# Patient Record
Sex: Male | Born: 1960 | Race: White | Hispanic: No | Marital: Single | Smoking: Current every day smoker
Health system: Southern US, Community
[De-identification: ages and names within clinical notes are randomized; demographics above are authoritative.]

## PROBLEM LIST (undated history)

## (undated) DIAGNOSIS — Z806 Family history of leukemia: Secondary | ICD-10-CM

## (undated) DIAGNOSIS — F32A Depression, unspecified: Secondary | ICD-10-CM

## (undated) DIAGNOSIS — J449 Chronic obstructive pulmonary disease, unspecified: Secondary | ICD-10-CM

## (undated) DIAGNOSIS — F419 Anxiety disorder, unspecified: Secondary | ICD-10-CM

## (undated) DIAGNOSIS — E785 Hyperlipidemia, unspecified: Secondary | ICD-10-CM

## (undated) DIAGNOSIS — Z8051 Family history of malignant neoplasm of kidney: Secondary | ICD-10-CM

## (undated) DIAGNOSIS — B351 Tinea unguium: Secondary | ICD-10-CM

## (undated) DIAGNOSIS — N1330 Unspecified hydronephrosis: Secondary | ICD-10-CM

## (undated) DIAGNOSIS — D649 Anemia, unspecified: Secondary | ICD-10-CM

## (undated) DIAGNOSIS — K759 Inflammatory liver disease, unspecified: Secondary | ICD-10-CM

## (undated) DIAGNOSIS — B182 Chronic viral hepatitis C: Secondary | ICD-10-CM

## (undated) DIAGNOSIS — M25569 Pain in unspecified knee: Secondary | ICD-10-CM

## (undated) DIAGNOSIS — R350 Frequency of micturition: Secondary | ICD-10-CM

## (undated) DIAGNOSIS — I639 Cerebral infarction, unspecified: Secondary | ICD-10-CM

## (undated) DIAGNOSIS — J9600 Acute respiratory failure, unspecified whether with hypoxia or hypercapnia: Secondary | ICD-10-CM

## (undated) DIAGNOSIS — L853 Xerosis cutis: Secondary | ICD-10-CM

## (undated) DIAGNOSIS — K219 Gastro-esophageal reflux disease without esophagitis: Secondary | ICD-10-CM

## (undated) DIAGNOSIS — M792 Neuralgia and neuritis, unspecified: Secondary | ICD-10-CM

## (undated) DIAGNOSIS — Z1509 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: Secondary | ICD-10-CM

## (undated) DIAGNOSIS — C661 Malignant neoplasm of right ureter: Secondary | ICD-10-CM

## (undated) DIAGNOSIS — L8990 Pressure ulcer of unspecified site, unspecified stage: Secondary | ICD-10-CM

## (undated) DIAGNOSIS — F331 Major depressive disorder, recurrent, moderate: Secondary | ICD-10-CM

## (undated) DIAGNOSIS — Z8042 Family history of malignant neoplasm of prostate: Secondary | ICD-10-CM

## (undated) DIAGNOSIS — R2689 Other abnormalities of gait and mobility: Secondary | ICD-10-CM

## (undated) DIAGNOSIS — C189 Malignant neoplasm of colon, unspecified: Secondary | ICD-10-CM

## (undated) DIAGNOSIS — I739 Peripheral vascular disease, unspecified: Secondary | ICD-10-CM

## (undated) DIAGNOSIS — Z8673 Personal history of transient ischemic attack (TIA), and cerebral infarction without residual deficits: Secondary | ICD-10-CM

## (undated) DIAGNOSIS — I87303 Chronic venous hypertension (idiopathic) without complications of bilateral lower extremity: Secondary | ICD-10-CM

## (undated) DIAGNOSIS — E46 Unspecified protein-calorie malnutrition: Secondary | ICD-10-CM

## (undated) DIAGNOSIS — F411 Generalized anxiety disorder: Secondary | ICD-10-CM

## (undated) DIAGNOSIS — C61 Malignant neoplasm of prostate: Secondary | ICD-10-CM

## (undated) DIAGNOSIS — I70209 Unspecified atherosclerosis of native arteries of extremities, unspecified extremity: Secondary | ICD-10-CM

## (undated) DIAGNOSIS — N401 Enlarged prostate with lower urinary tract symptoms: Secondary | ICD-10-CM

## (undated) DIAGNOSIS — G8929 Other chronic pain: Secondary | ICD-10-CM

## (undated) DIAGNOSIS — N131 Hydronephrosis with ureteral stricture, not elsewhere classified: Secondary | ICD-10-CM

## (undated) DIAGNOSIS — N138 Other obstructive and reflux uropathy: Secondary | ICD-10-CM

## (undated) DIAGNOSIS — M6281 Muscle weakness (generalized): Secondary | ICD-10-CM

## (undated) DIAGNOSIS — Z800 Family history of malignant neoplasm of digestive organs: Secondary | ICD-10-CM

## (undated) DIAGNOSIS — C801 Malignant (primary) neoplasm, unspecified: Secondary | ICD-10-CM

## (undated) DIAGNOSIS — Z720 Tobacco use: Secondary | ICD-10-CM

## (undated) DIAGNOSIS — G4700 Insomnia, unspecified: Secondary | ICD-10-CM

## (undated) DIAGNOSIS — R1310 Dysphagia, unspecified: Secondary | ICD-10-CM

## (undated) DIAGNOSIS — F329 Major depressive disorder, single episode, unspecified: Secondary | ICD-10-CM

## (undated) DIAGNOSIS — C679 Malignant neoplasm of bladder, unspecified: Secondary | ICD-10-CM

## (undated) HISTORY — DX: Anxiety disorder, unspecified: F41.9

## (undated) HISTORY — DX: Depression, unspecified: F32.A

## (undated) HISTORY — DX: Insomnia, unspecified: G47.00

## (undated) HISTORY — DX: Chronic viral hepatitis C: B18.2

## (undated) HISTORY — DX: Personal history of transient ischemic attack (TIA), and cerebral infarction without residual deficits: Z86.73

## (undated) HISTORY — DX: Unspecified protein-calorie malnutrition: E46

## (undated) HISTORY — DX: Tobacco use: Z72.0

## (undated) HISTORY — DX: Major depressive disorder, single episode, unspecified: F32.9

## (undated) HISTORY — DX: Malignant (primary) neoplasm, unspecified: C80.1

## (undated) HISTORY — DX: Major depressive disorder, recurrent, moderate: F33.1

## (undated) HISTORY — DX: Cerebral infarction, unspecified: I63.9

## (undated) HISTORY — PX: COLON SURGERY: SHX602

## (undated) HISTORY — DX: Family history of malignant neoplasm of prostate: Z80.42

## (undated) HISTORY — DX: Family history of malignant neoplasm of kidney: Z80.51

## (undated) HISTORY — PX: OTHER SURGICAL HISTORY: SHX169

## (undated) HISTORY — DX: Muscle weakness (generalized): M62.81

## (undated) HISTORY — DX: Xerosis cutis: L85.3

## (undated) HISTORY — DX: Tinea unguium: B35.1

## (undated) HISTORY — DX: 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: Z15.09

## (undated) HISTORY — DX: Other chronic pain: G89.29

## (undated) HISTORY — DX: Family history of leukemia: Z80.6

## (undated) HISTORY — DX: Other abnormalities of gait and mobility: R26.89

## (undated) HISTORY — DX: Benign prostatic hyperplasia with lower urinary tract symptoms: N13.8

## (undated) HISTORY — DX: Neuralgia and neuritis, unspecified: M79.2

## (undated) HISTORY — DX: Family history of malignant neoplasm of digestive organs: Z80.0

## (undated) HISTORY — DX: Other obstructive and reflux uropathy: N40.1

## (undated) HISTORY — DX: Generalized anxiety disorder: F41.1

## (undated) HISTORY — DX: Hyperlipidemia, unspecified: E78.5

## (undated) MED FILL — Iron Sucrose Inj 20 MG/ML (Fe Equiv): INTRAVENOUS | Qty: 10 | Status: AC

---

## 2005-10-31 ENCOUNTER — Emergency Department: Payer: Self-pay | Admitting: Emergency Medicine

## 2017-11-11 ENCOUNTER — Ambulatory Visit (INDEPENDENT_AMBULATORY_CARE_PROVIDER_SITE_OTHER): Payer: Medicaid Other | Admitting: Internal Medicine

## 2017-11-11 ENCOUNTER — Encounter: Payer: Self-pay | Admitting: Internal Medicine

## 2017-11-11 VITALS — BP 133/75 | HR 74 | Resp 16 | Ht 67.0 in | Wt 140.0 lb

## 2017-11-11 DIAGNOSIS — R300 Dysuria: Secondary | ICD-10-CM

## 2017-11-11 DIAGNOSIS — Z125 Encounter for screening for malignant neoplasm of prostate: Secondary | ICD-10-CM

## 2017-11-11 DIAGNOSIS — I639 Cerebral infarction, unspecified: Secondary | ICD-10-CM | POA: Diagnosis not present

## 2017-11-11 DIAGNOSIS — R531 Weakness: Secondary | ICD-10-CM | POA: Diagnosis not present

## 2017-11-11 DIAGNOSIS — C189 Malignant neoplasm of colon, unspecified: Secondary | ICD-10-CM

## 2017-11-11 DIAGNOSIS — E782 Mixed hyperlipidemia: Secondary | ICD-10-CM | POA: Diagnosis not present

## 2017-11-11 DIAGNOSIS — F322 Major depressive disorder, single episode, severe without psychotic features: Secondary | ICD-10-CM

## 2017-11-11 MED ORDER — ATORVASTATIN CALCIUM 80 MG PO TABS: 80.0000 mg | ORAL_TABLET | Freq: Every day | ORAL | 3 refills | Status: DC

## 2017-11-11 MED ORDER — GABAPENTIN 100 MG PO CAPS: ORAL_CAPSULE | ORAL | 3 refills | Status: DC

## 2017-11-11 MED ORDER — QUETIAPINE FUMARATE 50 MG PO TABS: 50.0000 mg | ORAL_TABLET | Freq: Every day | ORAL | 3 refills | Status: DC

## 2017-11-11 MED ORDER — ESCITALOPRAM OXALATE 20 MG PO TABS: 20.0000 mg | ORAL_TABLET | Freq: Every day | ORAL | 3 refills | Status: DC

## 2017-11-11 NOTE — Progress Notes (Signed)
[facility] [place], [place] [address]  Internal MEDICINE  Office Visit Note  Patient Name: Chad [name]  [id number]  [id number]  Date of Service: 11/11/2017  Complaints/HPI Pt is here for establishment of PCP.  Pt is just moving from out of town. He has multiple medical problems. 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 Pt does have 40 year pack history.Pt is c/o being depressed. He has decreased po intake. Living with his ex- wife  Current Medication: Outpatient Encounter Medications as of 11/11/2017  Medication Sig  . atorvastatin (LIPITOR) 80 MG tablet Take 1 tablet (80 mg total) by mouth daily.  . furosemide (LASIX) 20 MG tablet Take 20 mg by mouth.  Marland Kitchen ibuprofen (ADVIL,MOTRIN) 200 MG tablet Take 200 mg by mouth every 6 (six) hours as needed.  . nortriptyline (PAMELOR) 25 MG capsule Take 25 mg by mouth at bedtime.  . [DISCONTINUED] atorvastatin (LIPITOR) 80 MG tablet Take 80 mg by mouth daily.  . [DISCONTINUED] escitalopram (LEXAPRO) 10 MG tablet Take 10 mg by mouth daily.  . [DISCONTINUED] QUEtiapine (SEROQUEL) 50 MG tablet Take 50 mg by mouth at bedtime.  Marland Kitchen escitalopram (LEXAPRO) 20 MG tablet Take 1 tablet (20 mg total) by mouth daily.  Marland Kitchen gabapentin (NEURONTIN) 100 MG capsule Take one to 2 tabs for pain 3 x day  . QUEtiapine (SEROQUEL) 50 MG tablet Take 1 tablet (50 mg total) by mouth at bedtime.   No facility-administered encounter medications on file as of 11/11/2017.    Surgical History: Past Surgical History:  Procedure Laterality Date  . COLON SURGERY    . tumor removed      Medical History: Past Medical History:  Diagnosis Date  . Anxiety   . Cancer [facility])    prostate  . Depression   . Hyperlipidemia   . Nerve pain   . Stroke [facility])     Family History: Family History  Problem Relation  Age of Onset  . Prostate cancer Father     Social History   Socioeconomic History  . Marital status: Single    Spouse name: Not on file  . Number of children: Not on file  . Years of education: Not on file  . Highest education level: Not on file  Occupational History  . Not on file  Social Needs  . Financial resource strain: Not on file  . Food insecurity:    Worry: Not on file    Inability: Not on file  . Transportation needs:    Medical: Not on file    Non-medical: Not on file  Tobacco Use  . Smoking status: Current Every Day Smoker    Packs/day: 1.00    Types: Cigarettes  . Smokeless tobacco: Never Used  Substance and Sexual Activity  . Alcohol use: Never    Frequency: Never  . Drug use: Never  . Sexual activity: Not on file  Lifestyle  . Physical activity:    Days per week: Not on file    Minutes per session: Not on file  . Stress: Not on file  Relationships  . Social connections:    Talks on phone: Not on file    Gets together: Not on file    Attends religious service: Not on file    Active member of club or organization: Not on file    Attends meetings of clubs  or organizations: Not on file    Relationship status: Not on file  . Intimate partner violence:    Fear of current or ex partner: Not on file    Emotionally abused: Not on file    Physically abused: Not on file    Forced sexual activity: Not on file  Other Topics Concern  . Not on file  Social History Narrative  . Not on file   Review of Systems  Constitutional: Negative for chills, fatigue and unexpected weight change.  HENT: Positive for postnasal drip. Negative for congestion, rhinorrhea, sneezing and sore throat.   Eyes: Negative for redness.  Respiratory: Positive for cough and shortness of breath. Negative for chest tightness.   Cardiovascular: Negative for chest pain and palpitations.  Gastrointestinal: Negative for abdominal pain, constipation, diarrhea, nausea and vomiting.   Genitourinary: Negative for dysuria and frequency.  Musculoskeletal: Negative for arthralgias, back pain, joint swelling and neck pain.  Skin: Negative for rash.  Neurological: Negative.  Negative for tremors and numbness.  Hematological: Negative for adenopathy. Does not bruise/bleed easily.  Psychiatric/Behavioral: Negative for behavioral problems (Depression), sleep disturbance and suicidal ideas. The patient is not nervous/anxious.    Vital Signs: BP 133/75   Pulse 74   Resp 16   Ht 5\' 7"  (1.702 m)   Wt 140 lb (63.5 kg)   SpO2 98%   BMI 21.93 kg/m   Physical Exam  Constitutional: He is oriented to person, place, and time. He appears well-developed and well-nourished. No distress.  HENT:  Head: Normocephalic and atraumatic.  Mouth/Throat: Oropharynx is clear and moist. No oropharyngeal exudate.  Eyes: Pupils are equal, round, and reactive to light. EOM are normal.  Neck: Normal range of motion. Neck supple. No JVD present. No tracheal deviation present. No thyromegaly present.  Cardiovascular: Normal rate, regular rhythm and normal heart sounds. Exam reveals no gallop and no friction rub.  No murmur heard. Pulmonary/Chest: Effort normal. No respiratory distress. He has no wheezes. He has no rales. He exhibits no tenderness.  Abdominal: Soft. Bowel sounds are normal.  Musculoskeletal: Normal range of motion.  Lymphadenopathy:    He has no cervical adenopathy.  Neurological: He is alert and oriented to person, place, and time. No cranial nerve deficit. He exhibits abnormal muscle tone.  Left sided weakness   Skin: Skin is warm and dry. He is not diaphoretic.  Psychiatric: He has a normal mood and affect. His behavior is normal. Judgment and thought content normal.   Assessment/Plan: 1. Cerebrovascular accident (CVA), unspecified mechanism ([place]) - Comprehensive metabolic panel - US Carotid Bilateral; Future  2. Left-sided weakness - Increase gabapentin (NEURONTIN) 100 MG  capsule; Take one to 2 tabs for pain 3 x day  Dispense: 180 capsule; Refill: 3 - CBC with Differential/Platelet - TSH - T4, free - US Carotid Bilateral; Future  3. Mixed hyperlipidemia - atorvastatin (LIPITOR) 80 MG tablet; Take 1 tablet (80 mg total) by mouth daily.  Dispense: 90 tablet; Refill: 3 - Lipid Panel With LDL/HDL Ratio  4. Prostate cancer screening - PSA  5. Dysuria - Urinalysis  6. Malignant neoplasm of colon, unspecified part of colon ([place]) - Will need to see GI and Oncology  7. Depression, major, single episode, severe (HCC) - Continue QUEtiapine (SEROQUEL) 50 MG tablet; Take 1 tablet (50 mg total) by mouth at bedtime.  Dispense: 90 tablet; Refill: 3 - Increase escitalopram (LEXAPRO) 20 MG tablet; Take 1 tablet (20 mg total) by mouth daily.  Dispense: 90 tablet;  Refill: 3  6. Malignant neoplasm of colon, unspecified part of colon ([place]) - Will need to see GI and oncology   General Counseling: kehinde bowdish understanding of the findings of todays visit and agrees with plan of treatment. I have discussed any further diagnostic evaluation that may be needed or ordered today. We also reviewed his medications today. he has been encouraged to call the office with any questions or concerns that should arise related to todays visit. All medical records reviewed   Orders Placed This Encounter  Procedures  . US Carotid Bilateral  . CBC with Differential/Platelet  . Lipid Panel With LDL/HDL Ratio  . TSH  . T4, free  . Comprehensive metabolic panel  . Urinalysis  . PSA    Meds ordered this encounter  Medications  . atorvastatin (LIPITOR) 80 MG tablet    Sig: Take 1 tablet (80 mg total) by mouth daily.    Dispense:  90 tablet    Refill:  3  . gabapentin (NEURONTIN) 100 MG capsule    Sig: Take one to 2 tabs for pain 3 x day    Dispense:  180 capsule    Refill:  3  . QUEtiapine (SEROQUEL) 50 MG tablet    Sig: Take 1 tablet (50 mg total) by mouth at bedtime.     Dispense:  90 tablet    Refill:  3  . escitalopram (LEXAPRO) 20 MG tablet    Sig: Take 1 tablet (20 mg total) by mouth daily.    Dispense:  90 tablet    Refill:  3    Time spent: 35 Minutes

## 2017-11-19 ENCOUNTER — Encounter: Payer: Self-pay | Admitting: Internal Medicine

## 2017-11-23 ENCOUNTER — Ambulatory Visit: Payer: Self-pay | Admitting: Pharmacy Technician

## 2017-11-23 DIAGNOSIS — Z79899 Other long term (current) drug therapy: Secondary | ICD-10-CM

## 2017-11-23 NOTE — Progress Notes (Addendum)
Completed [facility] application and contract.  Patient agreed to all terms of the [facility] contract.    Referred patient to Dr. Trellis Paganini and Dr. Ellsworth [facility].  Patient expressed concerns regarding obtaining food.  Sending referral for emergency food through [facility].  Patient approved to receive medication assistance at [facility] through 2019, as long as eligibility criteria continues to be met.[facility]  Obtained consent for [place] [facility].  Resent referral to [facility] on [address].  [facility]

## 2017-11-24 ENCOUNTER — Telehealth: Payer: Self-pay

## 2017-11-24 NOTE — Telephone Encounter (Signed)
Called patient and gave financial dept #, we do not do referral for charity care and he needs to reach out to dept and they can set him up with paperwork. [place]

## 2017-11-25 ENCOUNTER — Ambulatory Visit: Payer: Self-pay

## 2017-12-25 ENCOUNTER — Other Ambulatory Visit: Payer: Self-pay

## 2018-01-05 ENCOUNTER — Ambulatory Visit: Payer: Self-pay | Admitting: Internal Medicine

## 2018-03-19 ENCOUNTER — Telehealth: Payer: Self-pay | Admitting: Pharmacy Technician

## 2018-03-19 NOTE — Telephone Encounter (Signed)
Patient has full Medicaid.  Patient acknowledged that he understood that [facility] would no longer be able to provide medication assistance.  [facility]

## 2018-04-21 ENCOUNTER — Emergency Department
Admission: EM | Admit: 2018-04-21 | Discharge: 2018-04-21 | Disposition: A | Discharge status: Home / Self Care | Payer: Medicaid Other | Attending: Emergency Medicine | Admitting: Emergency Medicine

## 2018-04-21 ENCOUNTER — Emergency Department: Payer: Medicaid Other

## 2018-04-21 ENCOUNTER — Encounter: Payer: Self-pay | Admitting: Emergency Medicine

## 2018-04-21 ENCOUNTER — Other Ambulatory Visit: Payer: Self-pay

## 2018-04-21 DIAGNOSIS — F17210 Nicotine dependence, cigarettes, uncomplicated: Secondary | ICD-10-CM | POA: Insufficient documentation

## 2018-04-21 DIAGNOSIS — Z79899 Other long term (current) drug therapy: Secondary | ICD-10-CM | POA: Insufficient documentation

## 2018-04-21 DIAGNOSIS — M25562 Pain in left knee: Secondary | ICD-10-CM | POA: Diagnosis present

## 2018-04-21 DIAGNOSIS — M1712 Unilateral primary osteoarthritis, left knee: Secondary | ICD-10-CM | POA: Diagnosis not present

## 2018-04-21 MED ORDER — TRAMADOL HCL 50 MG PO TABS: 50.0000 mg | ORAL_TABLET | Freq: Four times a day (QID) | ORAL | 0 refills | Status: DC | PRN

## 2018-04-21 NOTE — ED Provider Notes (Signed)
[facility] Emergency Department Provider Note   ____________________________________________   First MD Initiated Contact with Patient 04/21/18 1446     (approximate)  I have reviewed the triage vital signs and the nursing notes.   HISTORY  Chief Complaint Knee Pain    HPI Chad [name] is a 57 y.o. male patient presents with 6-year history of left knee pain.  Patient state he noticed injury 6 years ago but cannot remember the exact provocative incidents.  Patient state he is taking gabapentin for nerve pain but is not helping his knee.  Patient also takes Lexapro for anxiety.  Patient family report he is attention seeking.  Patient requests "legal drugs" for knee pain.  Past Medical History:  Diagnosis Date  . Anxiety   . Cancer [facility])    prostate  . Depression   . Hyperlipidemia   . Nerve pain   . Stroke [facility])     There are no active problems to display for this patient.   Past Surgical History:  Procedure Laterality Date  . COLON SURGERY     En bloc extended right hemicolectomy 07/2017  . tumor removed       Prior to Admission medications   Medication Sig Start Date End Date Taking? Authorizing Provider  atorvastatin (LIPITOR) 80 MG tablet Take 1 tablet (80 mg total) by mouth daily. 11/11/17   [name], MD  escitalopram (LEXAPRO) 20 MG tablet Take 1 tablet (20 mg total) by mouth daily. 11/11/17   [name], MD  furosemide (LASIX) 20 MG tablet Take 20 mg by mouth.    [provider]  gabapentin (NEURONTIN) 100 MG capsule Take one to 2 tabs for pain 3 x day 11/11/17   [name], MD  ibuprofen (ADVIL,MOTRIN) 200 MG tablet Take 200 mg by mouth every 6 (six) hours as needed.    [provider]  QUEtiapine (SEROQUEL) 50 MG tablet Take 1 tablet (50 mg total) by mouth at bedtime. 11/11/17   [name], MD  traMADol (ULTRAM) 50 MG tablet Take 1 tablet (50 mg total) by mouth every 6 (six) hours as needed. 04/21/18  04/21/19  Sable Feil, PA-C    Allergies Penicillins  Family History  Problem Relation Age of Onset  . Prostate cancer Father     Social History Social History   Tobacco Use  . Smoking status: Current Every Day Smoker    Packs/day: 1.00    Types: Cigarettes  . Smokeless tobacco: Never Used  Substance Use Topics  . Alcohol use: Never    Frequency: Never  . Drug use: Never    Review of Systems  Constitutional: No fever/chills Eyes: No visual changes. ENT: No sore throat. Cardiovascular: Denies chest pain. Respiratory: Denies shortness of breath. Gastrointestinal: No abdominal pain.  No nausea, no vomiting.  No diarrhea.  No constipation. Genitourinary: Negative for dysuria. Musculoskeletal: Negative for back pain. Skin: Negative for rash. Neurological: Negative for headaches, focal weakness or numbness. Psychiatric:Anxiety and depression. Endocrine:Hyperlipidemia. Allergic/Immunilogical: Penicillin. ____________________________________________   PHYSICAL EXAM:  VITAL SIGNS: ED Triage Vitals  Enc Vitals Group     BP 04/21/18 1408 115/74     Pulse Rate 04/21/18 1408 88     Resp 04/21/18 1408 16     Temp 04/21/18 1408 98.2 F (36.8 C)     Temp Source 04/21/18 1408 Oral     SpO2 04/21/18 1408 100 %     Weight 04/21/18 1409 147 lb (66.7  kg)     Height 04/21/18 1409 5\' 7"  (1.702 m)     Head Circumference --      Peak Flow --      Pain Score 04/21/18 1408 8     Pain Loc --      Pain Edu? --      Excl. in [place]? --    Constitutional: Alert and oriented. Well appearing and in no acute distress. Cardiovascular: Normal rate, regular rhythm. Grossly normal heart sounds.  Good peripheral circulation. Respiratory: Normal respiratory effort.  No retractions. Lungs CTAB. Musculoskeletal: Patient is wearing a Velcro knee brace.  Removal of the brain shows no obvious deformity or edema.  Mild crepitus palpation.  No joint effusion patient has full equal range of  motion.  Neurologic:  Normal speech and language. No gross focal neurologic deficits are appreciated. No gait instability. Skin:  Skin is warm, dry and intact. No rash noted. Psychiatric: Mood and affect are normal. Speech and behavior are normal.  ____________________________________________   LABS (all labs ordered are listed, but only abnormal results are displayed)  Labs Reviewed - No data to display ____________________________________________  EKG   ____________________________________________  RADIOLOGY  ED MD interpretation:    Official radiology report(s): Dg Knee Complete 4 Views Left  Result Date: 04/21/2018 CLINICAL DATA:  Left knee pain for 6 years. EXAM: LEFT KNEE - COMPLETE 4+ VIEW COMPARISON:  None. FINDINGS: Marked femorotibial joint space narrowing is identified greatest along the lateral femorotibial compartment where there is bone-on-bone apposition with subchondral sclerosis and cystic change noted across the joint. Minimal spurring is noted off the femoral condyles and lateral tibial plateau. Small joint effusion is identified on the lateral view within the suprapatellar compartment. No fracture or bone destruction is identified. IMPRESSION: Marked femorotibial joint space narrowing of the left knee more so along the lateral aspect where there is bone-on-bone apposition. Small suprapatellar joint effusion. No acute osseous abnormality. Electronically Signed   By: Ashley Royalty M.D.   On: 04/21/2018 15:38    ____________________________________________   PROCEDURES  Procedure(s) performed: None  Procedures  Critical Care performed: No  ____________________________________________   INITIAL IMPRESSION / ASSESSMENT AND PLAN / ED COURSE  As part of my medical decision making, I reviewed the following data within the electronic MEDICAL RECORD NUMBER   Knee pain secondary to severe DJD.  Discussed x-ray findings with patient.  Advised patient pain medication will  not cure this condition.  Asked to follow orthopedic by calling for an appointment in the morning.     ____________________________________________   FINAL CLINICAL IMPRESSION(S) / ED DIAGNOSES  Final diagnoses:  Primary osteoarthritis of left knee     ED Discharge Orders         Ordered    traMADol (ULTRAM) 50 MG tablet  Every 6 hours PRN     04/21/18 1559           Note:  This document was prepared using Dragon voice recognition software and may include unintentional dictation errors.    Sable Feil, PA-C 04/21/18 1603    Drenda Freeze, MD 04/26/18 [phone]

## 2018-04-21 NOTE — Discharge Instructions (Addendum)
Be advised that pain medication will not cure your condition.  You need to see an orthopedic doctor for definitive evaluation and treatment.

## 2018-04-21 NOTE — ED Notes (Signed)
See triage note  Presents with pain to left knee  States he has had pain to knee for a while  denies any recent injury  No swelling or dislocation noted

## 2018-04-21 NOTE — ED Triage Notes (Signed)
Pt in via EMS from home. EMS reports pt is out of his gabapentin and lexapro and c/o pain to left knee. Pt with hx of CVA tat affected left side.  VS 120/78, HR 68,  Family reports pt is attention seeking.

## 2018-04-21 NOTE — ED Triage Notes (Signed)
Pt comes into the ED via [facility] from home c/o left knee pain.  Patient has been taking gabapentin but he states "I need more drugs, the legal kind.  Donnald Garre been taking this one and it doesn't work".  Patient ambulatory to triage at this time and in NAD with even and unlabored respirations. Patient denies any known injury other than one 6 years ago.

## 2018-04-25 ENCOUNTER — Emergency Department: Payer: Medicaid Other

## 2018-04-25 ENCOUNTER — Inpatient Hospital Stay
Admission: EM | Admit: 2018-04-25 | Discharge: 2018-05-07 | DRG: 853 | Disposition: A | Discharge status: Skilled Nursing Facility | Payer: Medicaid Other | Attending: Internal Medicine | Admitting: Internal Medicine

## 2018-04-25 ENCOUNTER — Other Ambulatory Visit: Payer: Self-pay

## 2018-04-25 ENCOUNTER — Encounter
Admission: EM | Disposition: A | Discharge status: Skilled Nursing Facility | Payer: Self-pay | Source: Home / Self Care | Attending: Internal Medicine

## 2018-04-25 ENCOUNTER — Inpatient Hospital Stay: Payer: Medicaid Other | Admitting: Certified Registered"

## 2018-04-25 DIAGNOSIS — E785 Hyperlipidemia, unspecified: Secondary | ICD-10-CM | POA: Diagnosis present

## 2018-04-25 DIAGNOSIS — R6521 Severe sepsis with septic shock: Secondary | ICD-10-CM

## 2018-04-25 DIAGNOSIS — Z79899 Other long term (current) drug therapy: Secondary | ICD-10-CM

## 2018-04-25 DIAGNOSIS — Z8546 Personal history of malignant neoplasm of prostate: Secondary | ICD-10-CM

## 2018-04-25 DIAGNOSIS — J9601 Acute respiratory failure with hypoxia: Secondary | ICD-10-CM | POA: Diagnosis present

## 2018-04-25 DIAGNOSIS — N170 Acute kidney failure with tubular necrosis: Secondary | ICD-10-CM | POA: Diagnosis present

## 2018-04-25 DIAGNOSIS — J449 Chronic obstructive pulmonary disease, unspecified: Secondary | ICD-10-CM | POA: Diagnosis present

## 2018-04-25 DIAGNOSIS — M6282 Rhabdomyolysis: Secondary | ICD-10-CM | POA: Diagnosis present

## 2018-04-25 DIAGNOSIS — R1310 Dysphagia, unspecified: Secondary | ICD-10-CM | POA: Diagnosis not present

## 2018-04-25 DIAGNOSIS — J9602 Acute respiratory failure with hypercapnia: Secondary | ICD-10-CM | POA: Diagnosis present

## 2018-04-25 DIAGNOSIS — F329 Major depressive disorder, single episode, unspecified: Secondary | ICD-10-CM | POA: Diagnosis present

## 2018-04-25 DIAGNOSIS — Z85038 Personal history of other malignant neoplasm of large intestine: Secondary | ICD-10-CM | POA: Diagnosis not present

## 2018-04-25 DIAGNOSIS — F419 Anxiety disorder, unspecified: Secondary | ICD-10-CM | POA: Diagnosis present

## 2018-04-25 DIAGNOSIS — R451 Restlessness and agitation: Secondary | ICD-10-CM | POA: Diagnosis not present

## 2018-04-25 DIAGNOSIS — Z781 Physical restraint status: Secondary | ICD-10-CM

## 2018-04-25 DIAGNOSIS — Z7189 Other specified counseling: Secondary | ICD-10-CM | POA: Diagnosis not present

## 2018-04-25 DIAGNOSIS — D509 Iron deficiency anemia, unspecified: Secondary | ICD-10-CM | POA: Diagnosis present

## 2018-04-25 DIAGNOSIS — Z515 Encounter for palliative care: Secondary | ICD-10-CM | POA: Diagnosis not present

## 2018-04-25 DIAGNOSIS — E872 Acidosis: Secondary | ICD-10-CM | POA: Diagnosis present

## 2018-04-25 DIAGNOSIS — Z6820 Body mass index (BMI) 20.0-20.9, adult: Secondary | ICD-10-CM | POA: Diagnosis not present

## 2018-04-25 DIAGNOSIS — N1330 Unspecified hydronephrosis: Secondary | ICD-10-CM | POA: Diagnosis not present

## 2018-04-25 DIAGNOSIS — R6520 Severe sepsis without septic shock: Secondary | ICD-10-CM

## 2018-04-25 DIAGNOSIS — A419 Sepsis, unspecified organism: Principal | ICD-10-CM | POA: Diagnosis present

## 2018-04-25 DIAGNOSIS — G9389 Other specified disorders of brain: Secondary | ICD-10-CM | POA: Diagnosis present

## 2018-04-25 DIAGNOSIS — E43 Unspecified severe protein-calorie malnutrition: Secondary | ICD-10-CM | POA: Diagnosis present

## 2018-04-25 DIAGNOSIS — D649 Anemia, unspecified: Secondary | ICD-10-CM

## 2018-04-25 DIAGNOSIS — N179 Acute kidney failure, unspecified: Secondary | ICD-10-CM

## 2018-04-25 DIAGNOSIS — Z8673 Personal history of transient ischemic attack (TIA), and cerebral infarction without residual deficits: Secondary | ICD-10-CM | POA: Diagnosis not present

## 2018-04-25 DIAGNOSIS — Z1389 Encounter for screening for other disorder: Secondary | ICD-10-CM

## 2018-04-25 DIAGNOSIS — Z9049 Acquired absence of other specified parts of digestive tract: Secondary | ICD-10-CM

## 2018-04-25 DIAGNOSIS — B182 Chronic viral hepatitis C: Secondary | ICD-10-CM | POA: Diagnosis present

## 2018-04-25 DIAGNOSIS — C689 Malignant neoplasm of urinary organ, unspecified: Secondary | ICD-10-CM | POA: Diagnosis not present

## 2018-04-25 DIAGNOSIS — Z888 Allergy status to other drugs, medicaments and biological substances status: Secondary | ICD-10-CM

## 2018-04-25 DIAGNOSIS — G9341 Metabolic encephalopathy: Secondary | ICD-10-CM | POA: Diagnosis present

## 2018-04-25 DIAGNOSIS — Z4659 Encounter for fitting and adjustment of other gastrointestinal appliance and device: Secondary | ICD-10-CM

## 2018-04-25 DIAGNOSIS — J9690 Respiratory failure, unspecified, unspecified whether with hypoxia or hypercapnia: Secondary | ICD-10-CM

## 2018-04-25 DIAGNOSIS — C649 Malignant neoplasm of unspecified kidney, except renal pelvis: Secondary | ICD-10-CM | POA: Diagnosis present

## 2018-04-25 DIAGNOSIS — N136 Pyonephrosis: Secondary | ICD-10-CM | POA: Diagnosis present

## 2018-04-25 DIAGNOSIS — F17210 Nicotine dependence, cigarettes, uncomplicated: Secondary | ICD-10-CM | POA: Diagnosis present

## 2018-04-25 DIAGNOSIS — K219 Gastro-esophageal reflux disease without esophagitis: Secondary | ICD-10-CM | POA: Diagnosis present

## 2018-04-25 DIAGNOSIS — R4182 Altered mental status, unspecified: Secondary | ICD-10-CM | POA: Diagnosis not present

## 2018-04-25 HISTORY — PX: CYSTOSCOPY WITH STENT PLACEMENT: SHX5790

## 2018-04-25 LAB — URINE DRUG SCREEN, QUALITATIVE (ARMC ONLY)
AMPHETAMINES, UR SCREEN: NOT DETECTED
BENZODIAZEPINE, UR SCRN: NOT DETECTED
Barbiturates, Ur Screen: NOT DETECTED
Cannabinoid 50 Ng, Ur ~~LOC~~: NOT DETECTED
Cocaine Metabolite,Ur ~~LOC~~: NOT DETECTED
MDMA (Ecstasy)Ur Screen: NOT DETECTED
METHADONE SCREEN, URINE: NOT DETECTED
Opiate, Ur Screen: NOT DETECTED
PHENCYCLIDINE (PCP) UR S: NOT DETECTED
Tricyclic, Ur Screen: NOT DETECTED

## 2018-04-25 LAB — CBC
HCT: 21.7 % — ABNORMAL LOW (ref 40.0–52.0)
Hemoglobin: 6.4 g/dL — ABNORMAL LOW (ref 13.0–18.0)
MCH: 17.4 pg — ABNORMAL LOW (ref 26.0–34.0)
MCHC: 29.6 g/dL — ABNORMAL LOW (ref 32.0–36.0)
MCV: 58.8 fL — AB (ref 80.0–100.0)
PLATELETS: 352 10*3/uL (ref 150–440)
RBC: 3.7 MIL/uL — AB (ref 4.40–5.90)
RDW: 28.7 % — ABNORMAL HIGH (ref 11.5–14.5)
WBC: 18.2 10*3/uL — AB (ref 3.8–10.6)

## 2018-04-25 LAB — ABO/RH: ABO/RH(D): O NEG

## 2018-04-25 LAB — CBC WITH DIFFERENTIAL/PLATELET
Band Neutrophils: 3 %
Basophils Absolute: 0.2 10*3/uL — ABNORMAL HIGH (ref 0–0.1)
Basophils Relative: 1 %
Blasts: 0 %
Eosinophils Absolute: 0 10*3/uL (ref 0–0.7)
Eosinophils Relative: 0 %
HEMATOCRIT: 22.2 % — AB (ref 40.0–52.0)
Hemoglobin: 6.4 g/dL — ABNORMAL LOW (ref 13.0–18.0)
LYMPHS PCT: 9 %
Lymphs Abs: 1.5 10*3/uL (ref 1.0–3.6)
MCH: 15.9 pg — AB (ref 26.0–34.0)
MCHC: 28.8 g/dL — ABNORMAL LOW (ref 32.0–36.0)
MCV: 55 fL — ABNORMAL LOW (ref 80.0–100.0)
Metamyelocytes Relative: 1 %
Monocytes Absolute: 0.5 10*3/uL (ref 0.2–1.0)
Monocytes Relative: 3 %
Myelocytes: 0 %
NEUTROS PCT: 83 %
NRBC: 8 /100{WBCs} — AB
Neutro Abs: 14.9 10*3/uL — ABNORMAL HIGH (ref 1.4–6.5)
OTHER: 0 %
PROMYELOCYTES RELATIVE: 0 %
Platelets: 522 10*3/uL — ABNORMAL HIGH (ref 150–440)
RBC: 4.04 MIL/uL — ABNORMAL LOW (ref 4.40–5.90)
RDW: 22.9 % — ABNORMAL HIGH (ref 11.5–14.5)
WBC: 17.1 10*3/uL — ABNORMAL HIGH (ref 3.8–10.6)

## 2018-04-25 LAB — BLOOD GAS, ARTERIAL
Acid-base deficit: 10.6 mmol/L — ABNORMAL HIGH (ref 0.0–2.0)
Acid-base deficit: 11 mmol/L — ABNORMAL HIGH (ref 0.0–2.0)
Allens test (pass/fail): POSITIVE — AB
Bicarbonate: 15.8 mmol/L — ABNORMAL LOW (ref 20.0–28.0)
Bicarbonate: 14.7 mmol/L — ABNORMAL LOW (ref 20.0–28.0)
FIO2: 0.35
FIO2: 0.6
LHR: 16 {breaths}/min
LHR: 16 {breaths}/min
O2 SAT: 92.2 %
O2 SAT: 72.1 %
PEEP/CPAP: 5 cmH2O
PEEP: 5 cmH2O
Patient temperature: 37
Patient temperature: 37
VT: 500 mL
VT: 400 mL
pCO2 arterial: 36 mmHg (ref 32.0–48.0)
pCO2 arterial: 32 mmHg (ref 32.0–48.0)
pH, Arterial: 7.25 — ABNORMAL LOW (ref 7.350–7.450)
pH, Arterial: 7.27 — ABNORMAL LOW (ref 7.350–7.450)
pO2, Arterial: 75 mmHg — ABNORMAL LOW (ref 83.0–108.0)
pO2, Arterial: 44 mmHg — ABNORMAL LOW (ref 83.0–108.0)

## 2018-04-25 LAB — URINALYSIS, COMPLETE (UACMP) WITH MICROSCOPIC
BACTERIA UA: NONE SEEN
Bilirubin Urine: NEGATIVE
GLUCOSE, UA: NEGATIVE mg/dL
KETONES UR: NEGATIVE mg/dL
Leukocytes, UA: NEGATIVE
Nitrite: NEGATIVE
PH: 5 (ref 5.0–8.0)
Protein, ur: 100 mg/dL — AB
SPECIFIC GRAVITY, URINE: 1.017 (ref 1.005–1.030)
Specific Gravity, Urine: 1.017 (ref 1.005–1.030)
Squamous Epithelial / HPF: NONE SEEN (ref 0–5)
Squamous Epithelial / LPF: NONE SEEN (ref 0–5)

## 2018-04-25 LAB — LACTIC ACID, PLASMA: LACTIC ACID, VENOUS: 1.4 mmol/L (ref 0.5–1.9)

## 2018-04-25 LAB — COMPREHENSIVE METABOLIC PANEL
ALT: 283 U/L — ABNORMAL HIGH (ref 0–44)
AST: 1155 U/L — ABNORMAL HIGH (ref 15–41)
Albumin: 2.9 g/dL — ABNORMAL LOW (ref 3.5–5.0)
Alkaline Phosphatase: 96 U/L (ref 38–126)
Anion gap: 12 (ref 5–15)
BUN: 88 mg/dL — ABNORMAL HIGH (ref 6–20)
CHLORIDE: 102 mmol/L (ref 98–111)
CO2: 22 mmol/L (ref 22–32)
Calcium: 7.6 mg/dL — ABNORMAL LOW (ref 8.9–10.3)
Creatinine, Ser: 3.42 mg/dL — ABNORMAL HIGH (ref 0.61–1.24)
GFR calc non Af Amer: 18 mL/min — ABNORMAL LOW (ref >60)
GFR, EST AFRICAN AMERICAN: 21 mL/min — AB (ref >60)
Glucose, Bld: 209 mg/dL — ABNORMAL HIGH (ref 70–99)
POTASSIUM: 4.7 mmol/L (ref 3.5–5.1)
Sodium: 136 mmol/L (ref 135–145)
Total Bilirubin: 0.9 mg/dL (ref 0.3–1.2)
Total Protein: 7.5 g/dL (ref 6.5–8.1)

## 2018-04-25 LAB — ACETAMINOPHEN LEVEL: Acetaminophen (Tylenol), Serum: <10 ug/mL — ABNORMAL LOW (ref 10–30)

## 2018-04-25 LAB — BASIC METABOLIC PANEL
Anion gap: 9 (ref 5–15)
BUN: 96 mg/dL — ABNORMAL HIGH (ref 6–20)
CO2: 15 mmol/L — ABNORMAL LOW (ref 22–32)
Calcium: 6.2 mg/dL — CL (ref 8.9–10.3)
Chloride: 110 mmol/L (ref 98–111)
Creatinine, Ser: 3.23 mg/dL — ABNORMAL HIGH (ref 0.61–1.24)
GFR calc Af Amer: 23 mL/min — ABNORMAL LOW (ref >60)
GFR, EST NON AFRICAN AMERICAN: 19 mL/min — AB (ref >60)
GLUCOSE: 162 mg/dL — AB (ref 70–99)
POTASSIUM: 4.7 mmol/L (ref 3.5–5.1)
Sodium: 134 mmol/L — ABNORMAL LOW (ref 135–145)

## 2018-04-25 LAB — APTT: APTT: 42 s — AB (ref 24–36)

## 2018-04-25 LAB — BRAIN NATRIURETIC PEPTIDE: B NATRIURETIC PEPTIDE 5: 142 pg/mL — AB (ref 0.0–100.0)

## 2018-04-25 LAB — LIPASE, BLOOD: LIPASE: 71 U/L — AB (ref 11–51)

## 2018-04-25 LAB — MAGNESIUM: MAGNESIUM: 3 mg/dL — AB (ref 1.7–2.4)

## 2018-04-25 LAB — TRIGLYCERIDES: Triglycerides: 87 mg/dL (ref <150)

## 2018-04-25 LAB — MRSA PCR SCREENING: MRSA BY PCR: NEGATIVE

## 2018-04-25 LAB — GLUCOSE, CAPILLARY: Glucose-Capillary: 128 mg/dL — ABNORMAL HIGH (ref 70–99)

## 2018-04-25 LAB — PREPARE RBC (CROSSMATCH)

## 2018-04-25 LAB — SAMPLE TO BLOOD BANK

## 2018-04-25 LAB — PROTIME-INR
INR: 2.29
Prothrombin Time: 25 seconds — ABNORMAL HIGH (ref 11.4–15.2)

## 2018-04-25 LAB — TROPONIN I: TROPONIN I: 0.13 ng/mL — AB (ref <0.03)

## 2018-04-25 LAB — ETHANOL: Alcohol, Ethyl (B): <10 mg/dL (ref <10)

## 2018-04-25 LAB — CK
CK TOTAL: 18913 U/L — AB (ref 49–397)
Total CK: 13072 U/L — ABNORMAL HIGH (ref 49–397)

## 2018-04-25 LAB — SALICYLATE LEVEL: SALICYLATE LVL: 28.1 mg/dL (ref 2.8–30.0)

## 2018-04-25 LAB — PHOSPHORUS: Phosphorus: 7.1 mg/dL — ABNORMAL HIGH (ref 2.5–4.6)

## 2018-04-25 SURGERY — CYSTOSCOPY, WITH STENT INSERTION
Anesthesia: General | Laterality: Right

## 2018-04-25 MED ORDER — VANCOMYCIN HCL IN DEXTROSE 1-5 GM/200ML-% IV SOLN
1000.0000 mg | Freq: Once | INTRAVENOUS | Status: AC
  Administered 2018-04-25: 1000 mg via INTRAVENOUS
  Filled 2018-04-25: qty 200

## 2018-04-25 MED ORDER — PANTOPRAZOLE SODIUM 40 MG PO PACK: 40.0000 mg | PACK | Freq: Every day | ORAL | Status: DC

## 2018-04-25 MED ORDER — NOREPINEPHRINE 4 MG/250ML-% IV SOLN
0.0000 ug/min | INTRAVENOUS | Status: DC
  Administered 2018-04-25: 2 ug/min via INTRAVENOUS

## 2018-04-25 MED ORDER — FENTANYL CITRATE (PF) 100 MCG/2ML IJ SOLN
100.0000 ug | INTRAMUSCULAR | Status: DC | PRN
  Administered 2018-04-26: 100 ug via INTRAVENOUS
  Filled 2018-04-25: qty 2

## 2018-04-25 MED ORDER — NOREPINEPHRINE 4 MG/250ML-% IV SOLN
0.0000 ug/min | Freq: Once | INTRAVENOUS | Status: DC
  Filled 2018-04-25: qty 250

## 2018-04-25 MED ORDER — VANCOMYCIN VARIABLE DOSE PER UNSTABLE RENAL FUNCTION (PHARMACIST DOSING): Status: DC

## 2018-04-25 MED ORDER — SODIUM CHLORIDE 0.9 % IV BOLUS
1000.0000 mL | Freq: Once | INTRAVENOUS | Status: AC
  Administered 2018-04-25: 1000 mL via INTRAVENOUS

## 2018-04-25 MED ORDER — SODIUM CHLORIDE 0.9% IV SOLUTION
Freq: Once | INTRAVENOUS | Status: DC
  Filled 2018-04-25: qty 250

## 2018-04-25 MED ORDER — SODIUM CHLORIDE 0.9 % IV SOLN
1.0000 g | Freq: Once | INTRAVENOUS | Status: AC
  Administered 2018-04-25: 1 g via INTRAVENOUS
  Filled 2018-04-25: qty 1

## 2018-04-25 MED ORDER — LORAZEPAM 2 MG/ML IJ SOLN
0.5000 mg | Freq: Once | INTRAMUSCULAR | Status: AC
  Administered 2018-04-25: 0.5 mg via INTRAVENOUS
  Filled 2018-04-25: qty 1

## 2018-04-25 MED ORDER — SODIUM CHLORIDE 0.9% IV SOLUTION: Freq: Once | INTRAVENOUS | Status: DC

## 2018-04-25 MED ORDER — MIDAZOLAM HCL 5 MG/5ML IJ SOLN
2.0000 mg | Freq: Once | INTRAMUSCULAR | Status: AC
  Administered 2018-04-25: 2 mg via INTRAVENOUS

## 2018-04-25 MED ORDER — ASPIRIN 300 MG RE SUPP
300.0000 mg | Freq: Once | RECTAL | Status: AC
  Administered 2018-04-25: 300 mg via RECTAL
  Filled 2018-04-25: qty 1

## 2018-04-25 MED ORDER — SUCCINYLCHOLINE CHLORIDE 20 MG/ML IJ SOLN
100.0000 mg | Freq: Once | INTRAMUSCULAR | Status: AC
  Administered 2018-04-25: 100 mg via INTRAVENOUS

## 2018-04-25 MED ORDER — CHLORHEXIDINE GLUCONATE 0.12% ORAL RINSE (MEDLINE KIT)
15.0000 mL | Freq: Two times a day (BID) | OROMUCOSAL | Status: DC
  Administered 2018-04-25 – 2018-05-06 (×18): 15 mL via OROMUCOSAL

## 2018-04-25 MED ORDER — FENTANYL CITRATE (PF) 100 MCG/2ML IJ SOLN
INTRAMUSCULAR | Status: AC
  Filled 2018-04-25: qty 2

## 2018-04-25 MED ORDER — NALOXONE HCL 2 MG/2ML IJ SOSY
4.0000 mg | PREFILLED_SYRINGE | Freq: Once | INTRAMUSCULAR | Status: AC
  Administered 2018-04-25: 4 mg via INTRAVENOUS

## 2018-04-25 MED ORDER — SODIUM BICARBONATE 8.4 % IV SOLN
INTRAVENOUS | Status: DC
  Administered 2018-04-25 – 2018-04-27 (×3): via INTRAVENOUS
  Filled 2018-04-25 (×7): qty 150

## 2018-04-25 MED ORDER — FENTANYL CITRATE (PF) 100 MCG/2ML IJ SOLN: 100.0000 ug | INTRAMUSCULAR | Status: DC | PRN

## 2018-04-25 MED ORDER — NALOXONE HCL 2 MG/2ML IJ SOSY
PREFILLED_SYRINGE | INTRAMUSCULAR | Status: AC
  Administered 2018-04-25: 2 mg via INTRAVENOUS
  Filled 2018-04-25: qty 2

## 2018-04-25 MED ORDER — ORAL CARE MOUTH RINSE
15.0000 mL | OROMUCOSAL | Status: DC
  Administered 2018-04-26 – 2018-04-28 (×23): 15 mL via OROMUCOSAL

## 2018-04-25 MED ORDER — DEXTROSE IN LACTATED RINGERS 5 % IV SOLN
INTRAVENOUS | Status: DC
  Administered 2018-04-25: 50 mL/h via INTRAVENOUS

## 2018-04-25 MED ORDER — PROPOFOL 1000 MG/100ML IV EMUL
0.0000 ug/kg/min | INTRAVENOUS | Status: DC
  Administered 2018-04-25: 40 ug/kg/min via INTRAVENOUS
  Administered 2018-04-25: 30 ug/kg/min via INTRAVENOUS
  Administered 2018-04-26 (×2): 40 ug/kg/min via INTRAVENOUS
  Filled 2018-04-25 (×4): qty 100

## 2018-04-25 MED ORDER — PROPOFOL 500 MG/50ML IV EMUL
INTRAVENOUS | Status: AC
  Filled 2018-04-25: qty 50

## 2018-04-25 MED ORDER — VANCOMYCIN HCL IN DEXTROSE 1-5 GM/200ML-% IV SOLN
1000.0000 mg | INTRAVENOUS | Status: DC
  Administered 2018-04-26: 1000 mg via INTRAVENOUS
  Filled 2018-04-25: qty 200

## 2018-04-25 MED ORDER — LEVOFLOXACIN IN D5W 750 MG/150ML IV SOLN: 750.0000 mg | Freq: Once | INTRAVENOUS | Status: DC

## 2018-04-25 MED ORDER — DOCUSATE SODIUM 100 MG PO CAPS: 100.0000 mg | ORAL_CAPSULE | Freq: Two times a day (BID) | ORAL | Status: DC | PRN

## 2018-04-25 MED ORDER — LORAZEPAM 2 MG/ML IJ SOLN
0.5000 mg | Freq: Once | INTRAMUSCULAR | Status: AC
  Administered 2018-04-25: 0.5 mg via INTRAVENOUS

## 2018-04-25 MED ORDER — NALOXONE HCL 2 MG/2ML IJ SOSY
2.0000 mg | PREFILLED_SYRINGE | Freq: Once | INTRAMUSCULAR | Status: AC
  Administered 2018-04-25: 2 mg via INTRAVENOUS

## 2018-04-25 SURGICAL SUPPLY — 22 items
14fr foley tray system ×2 IMPLANT
BAG DRAIN CYSTO-URO LG1000N (MISCELLANEOUS) ×2 IMPLANT
BRUSH SCRUB EZ  4% CHG (MISCELLANEOUS)
BRUSH SCRUB EZ 4% CHG (MISCELLANEOUS) IMPLANT
CATH URETL 5X70 OPEN END (CATHETERS) ×2 IMPLANT
CONRAY 43 FOR UROLOGY 50M (MISCELLANEOUS) ×2 IMPLANT
GLOVE BIO SURGEON STRL SZ 6.5 (GLOVE) ×2 IMPLANT
GOWN STRL REUS W/ TWL LRG LVL3 (GOWN DISPOSABLE) ×2 IMPLANT
GOWN STRL REUS W/TWL LRG LVL3 (GOWN DISPOSABLE) ×2
KIT TURNOVER CYSTO (KITS) ×2 IMPLANT
PACK CYSTO AR (MISCELLANEOUS) ×2 IMPLANT
SENSORWIRE 0.038 NOT ANGLED (WIRE) ×2
SET CYSTO W/LG BORE CLAMP LF (SET/KITS/TRAYS/PACK) ×2 IMPLANT
SOL .9 NS 3000ML IRR  AL (IV SOLUTION) ×1
SOL .9 NS 3000ML IRR UROMATIC (IV SOLUTION) ×1 IMPLANT
STENT URET 6FRX24 CONTOUR (STENTS) IMPLANT
STENT URET 6FRX26 CONTOUR (STENTS) IMPLANT
SURGILUBE 2OZ TUBE FLIPTOP (MISCELLANEOUS) ×2 IMPLANT
SYRINGE IRR TOOMEY STRL 70CC (SYRINGE) ×2 IMPLANT
TRAY FOLEY SLVR 14FR TEMP STAT (SET/KITS/TRAYS/PACK) ×2 IMPLANT
WATER STERILE IRR 1000ML POUR (IV SOLUTION) ×2 IMPLANT
WIRE SENSOR 0.038 NOT ANGLED (WIRE) ×1 IMPLANT

## 2018-04-25 NOTE — ED Notes (Signed)
Date and time results received: 04/25/18 8:33 AM   Test: Troponin Critical Value: 0.13 ng/mL  Name of Provider Notified: Dr. Cinda Quest

## 2018-04-25 NOTE — Progress Notes (Signed)
CODE SEPSIS - PHARMACY COMMUNICATION  **Broad Spectrum Antibiotics should be administered within 1 hour of Sepsis diagnosis**  Time Code Sepsis Called/Page Received: 10:07  Antibiotics Ordered: cefepime  Time of 1st antibiotic administration: 10:08  Additional action taken by pharmacy:   If necessary, Name of Provider/Nurse Contacted:     Laural Benes ,PharmD Clinical Pharmacist  04/25/2018  10:11 AM

## 2018-04-25 NOTE — Progress Notes (Signed)
RN made Chad Mane, NP aware that patient's calcium is critical at 6.2, NP acknowledged and gave no new orders.

## 2018-04-25 NOTE — Op Note (Signed)
Date of procedure: 04/25/18  Preoperative diagnosis:  1. Severe right hydronephrosis 2. Acute kidney injury 3. Sepsis with multiorgan dysfunction  Postoperative diagnosis:  1. As above   Procedure: 1. Cystoscopy 2. Right retrograde pyelogram 3. Right ureteral stent placement 4. Replacement of Foley catheter  Surgeon: Hollice Espy, MD  Anesthesia: General  Complications: None  Intraoperative findings: Extremely difficult Foley catheter placement.  Filling defect appreciated in right proximal ureter with severe hydronephrosis, medial deviation of the ureter which was extremely narrow along the entire course of the ureter with distal J hooking, medially deviated  ?Retroperitoneal fibrosis.  Hydronephrotic drip appreciated with motor oil like appearance of upper tract urine.  EBL: Minimal  Specimens: Urine cytology from right renal pelvis  Drains: 6 x 24 French double-J ureteral stent on right, 14 French ICU temperature probe catheter  Indication: Chad [name] is a 57 y.o. patient with severe right hydronephrosis, see consult note for details.  Informed consent was obtained from patient's healthcare proxy, his daughter by telephone.    Description of procedure:  The patient was taken to the operating room transported from the ICU.  He is [name] intubated and remained under sedation and was monitored.  He is placed in the dorsolithotomy position and prepped and draped in standard sterile fashion after his previous Foley was removed.  A universal timeout protocol was performed and no additional antibiotics were given as the patient had already received adequate coverage earlier in the day.  A 21 French cystoscope was advanced per urethra into the bladder.  Notably, the patient had prostamegaly and a slightly enlarged intravesical component to his prostate and elevated bladder neck.  There was a few small clots in his bladder which should be able to be irrigated out.  The remainder  of the bladder was fairly unremarkable other than trabeculation saccules appreciated, sequela of chronic outlet obstruction.  Attention was then turned to the right ureteral orifice at which time a retrograde pyelogram was performed.  This revealed a very tight somewhat deviated ureter medially.  There is a filling defect within the proximal ureter with severe associated hydronephrosis.  This is highly concerning for an intraluminal lesion.  At this point time, I attempted to place a sensor wire up to level of the kidney but was unable to navigate the J hooking.  I then attempted to place an angled Glidewire but there is some tortuosity and difficulty within the distal ureter beyond the J hooking which would not accommodate the angled Glidewire despite multiple maneuvers and efforts.  I then created a slurry of lidocaine, saline, and Conray and injected this to help with ureteral lubrication.  Ultimately, I was successful in getting the wire up to level of the proximal ureteral filling defect and was unable to navigate this area.  I advanced a 5 [place] open-ended ureteral catheter with significant difficulty up to level the proximal ureter.  The ureter itself is extremely tight but upon doing this, the medial deviation improved.  I was ultimately able to navigate around this up to level of the renal pelvis and advance a 5 [place] up into the kidney.  The wire was withdrawn.  At this point time, there was a brisk hydronephrotic drip appreciated which had a motor oil type appearance.  20 cc was drained from the upper tract and sent off for urine cytology.  Since wire was then replaced and the 5 [place] open-ended ureteral catheter was removed.  With significant effort and difficulty, I was eventually successful  in advancing a 6 x 26 French double-J ureteral stent over the wire up to level the renal pelvis.  Upon withdrawing the wire proximal curl was noted within the renal pelvis and one within the bladder.  The bladder  was then drained.  There was a reflux of bloody substance from the stent.  A 14 French temperature probe catheter was replaced.  There was not immediately returned and this was irrigated to ensure adequate position.  The patient was then clean and dry, repositioned supine position and taken immediately back to the ICU.  Findings were communicated with ICU nurse and also concern for possible obstruction of his catheter.  He was instructed to irrigate the catheter in 1 hour retention though no clots and possibly upsized to a 20 [place] as needed.  Plan: Monitor urine output.  Will likely order renal ultrasound tomorrow to assess whether or not the hydronephrosis is improved.  Given the thick motor oil like appearance of the urine, I am concerned for stent obstruction/clot.  Upsize catheter as needed for clots and hand irrigate as needed.  Hollice Espy, MD     Hollice Espy, M.D.

## 2018-04-25 NOTE — Progress Notes (Signed)
Pharmacy Antibiotic Note  Chad [name] is a 57 y.o. male admitted on 04/25/2018 with sepsis.  Pharmacy has been consulted for vancomycin dosing.  Plan: Vancomycin 1 gm IV x 1 in ED followed in approximately 17 hours (stacked dosing) by vancomycin 1 gm IV Q36H, predicted trough 17 mcg/ml. Pharmacy will continue to follow and adjust as needed to maintain trough 15 to 20 mcg/ml   Vd 46.3 L, Ke 0.022 hr-1, T1/2 30.9 hr  Of note we only have one serum creatinine value available to [place] which is from today and is 3.42 mg/dL. Unsure if this represents AKI or CKD but there is no history of CKD noted. I have entered maintenance doses but expect them to change as renal function improves (if this is AKI).   Height: 5\' 7"  (170.2 cm) Weight: 147 lb 0.8 oz (66.7 kg) IBW/kg (Calculated) : 66.1  Temp (24hrs), Avg:93.5 F (34.2 C), Min:91.9 F (33.3 C), Max:95.5 F (35.3 C)  Recent Labs  Lab 04/25/18 0751  WBC 17.1*  CREATININE 3.42*    Estimated Creatinine Clearance: 21.7 mL/min (A) (by C-G formula based on SCr of 3.42 mg/dL (H)).    Allergies  Allergen Reactions  . Penicillins Rash    Antimicrobials this admission:   Dose adjustments this admission:   Microbiology results:  BCx:   UCx:    Sputum:    MRSA PCR:   Thank you for allowing pharmacy to be a part of this patient's care.  Laural Benes, Pharm.D., BCPS Clinical Pharmacist 04/25/2018 10:40 AM

## 2018-04-25 NOTE — ED Notes (Signed)
To CT scan with respiratory.

## 2018-04-25 NOTE — Anesthesia Preprocedure Evaluation (Signed)
Anesthesia Evaluation  Patient identified by MRN, date of birth, ID band Patient unresponsive    Reviewed: Patient's Chart, lab work & pertinent test results, Unable to perform ROS - Chart review only  History of Anesthesia Complications Negative for: history of anesthetic complications  Airway Mallampati: Intubated       Dental   Pulmonary Current Smoker,           Cardiovascular negative cardio ROS       Neuro/Psych Anxiety Depression CVA    GI/Hepatic negative GI ROS, (+)     substance abuse  alcohol use and IV drug use,   Endo/Other  neg diabetes  Renal/GU negative Renal ROS     Musculoskeletal   Abdominal   Peds  Hematology   Anesthesia Other Findings   Reproductive/Obstetrics                             Anesthesia Physical Anesthesia Plan  ASA: IV and emergent  Anesthesia Plan: General   Post-op Pain Management:    Induction: Intravenous  PONV Risk Score and Plan: 1 and Ondansetron  Airway Management Planned: Oral ETT  Additional Equipment:   Intra-op Plan:   Post-operative Plan: Post-operative intubation/ventilation  Informed Consent: I have reviewed the patients History and Physical, chart, labs and discussed the procedure including the risks, benefits and alternatives for the proposed anesthesia with the patient or authorized representative who has indicated his/her understanding and acceptance.   History available from chart only  Plan Discussed with:   Anesthesia Plan Comments:         Anesthesia Quick Evaluation

## 2018-04-25 NOTE — ED Notes (Signed)
Pt became agitated in CT scan. Administered 0.5 mg Ativan per MD order.

## 2018-04-25 NOTE — Anesthesia Post-op Follow-up Note (Signed)
Anesthesia QCDR form completed.        

## 2018-04-25 NOTE — Consult Note (Signed)
Urology Consult  I have been asked to see the patient by Dr. Hazel Sams. , for evaluation and management of severe right hydronephrosis.  Chief Complaint: right hydronephrosis  History of Present Illness: Chad [name] is a 57 y.o. year old with history of colon cancer, Hep C who was found down by a neighbor brought in by EMS almost unresponsive and severely hypothermic, 63F.  He was intubated in the ICU and a noncontrast CT scan noted severe hydronephrosis without an obvious obstructing stone, transition point appears to be in the UPJ region.  He has extensive metabolic abnormalities including acute kidney injury, creatinine 3.42 with unknown baseline, elevated LFTs including elevated INR, leukocytosis, and a suspicious appearing urinalysis.  Working diagnosis is sepsis, possibly GI origin.  Upon my arrival to the ICU, the patient's daughter and son- in-law were at the bedside and the patient was being actively rewarmed.  Hes not currently on pressors.  He is on a small amount of propofol.  Per the family's report, he was seen in the emergency room earlier this week after he complained to them about chest pain.  In the emergency room, he only complained of knee pain however.  The daughter states that he did not tell him about his shortness of breath.  She does report a personal history of colon cancer status post colon resection.  She does not recall him ever mentioning hematuria or flank pain.  She is not aware of any kidney problems.  She also notes that he is a chronic drug abuser, has used heroin and pills, "anything he can get his hands on".  His urine drug screen is normal.  He does have an extensive smoking history and continues to smoke.   Past Medical History:  Diagnosis Date  . Anxiety   . Cancer [facility])    prostate  . Depression   . Hyperlipidemia   . Nerve pain   . Stroke [facility])    *Of note, the patient's family endorses a history of colon cancer, not prostate.   Prostate cancer was entered as an outpatient when he establish care back in 10/2017 and possibly may be an error.  Past Surgical History:  Procedure Laterality Date  . COLON SURGERY     En bloc extended right hemicolectomy 07/2017  . tumor removed       Home Medications:  Current Meds  Medication Sig  . atorvastatin (LIPITOR) 80 MG tablet Take 1 tablet (80 mg total) by mouth daily.  Marland Kitchen escitalopram (LEXAPRO) 20 MG tablet Take 1 tablet (20 mg total) by mouth daily.  Marland Kitchen gabapentin (NEURONTIN) 100 MG capsule Take one to 2 tabs for pain 3 x day  . ibuprofen (ADVIL,MOTRIN) 200 MG tablet Take 200 mg by mouth every 6 (six) hours as needed.  . traMADol (ULTRAM) 50 MG tablet Take 1 tablet (50 mg total) by mouth every 6 (six) hours as needed.    Allergies:  Allergies  Allergen Reactions  . Penicillins Rash    Family History  Problem Relation Age of Onset  . Prostate cancer Father     Social History:  reports that he has been smoking cigarettes. He has been smoking about 1.00 pack per day. He has never used smokeless tobacco. He reports that he does not drink alcohol or use drugs.  ROS: Unable to perform due to intubated status.  Physical Exam:  Vital signs in last 24 hours: Temp:  [91.9 F (33.3 C)-99 F (37.2 C)] 99 F (  37.2 C) (09/29 1158) Pulse Rate:  [69-107] 107 (09/29 1200) Resp:  [8-25] 16 (09/29 1200) BP: (93-114)/(45-62) 97/58 (09/29 1200) SpO2:  [89 %-100 %] 100 % (09/29 1200) FiO2 (%):  [45 %-85 %] 60 % (09/29 1013) Weight:  [66.7 kg-68.4 kg] 68.4 kg (09/29 1158) Constitutional: Intubated, minimally responsive HEENT: ET tube in place GI: Abdomen is soft, nontender, nondistended, no abdominal masses GU: Foley catheter in place draining concentrated appearing urine Extremities: cool and somewhat mottled Skin: Bilateral upper and lower extremity petechia appreciated Neurologic: Unresponsive  Laboratory Data:  Recent Labs    04/25/18 0751  WBC 17.1*  HGB 6.4*    HCT 22.2*   Recent Labs    04/25/18 0751  NA 136  K 4.7  CL 102  CO2 22  GLUCOSE 209*  BUN 88*  CREATININE 3.42*  CALCIUM 7.6*   Recent Labs    04/25/18 0744  INR 2.29   Component     Latest Ref Rng & Units 04/25/2018  Color, Urine     YELLOW AMBER (A)  Appearance     CLEAR TURBID (A)  Specific Gravity, Urine     1.005 - 1.030 1.017  pH     5.0 - 8.0 5.0  Glucose, UA     NEGATIVE mg/dL NEGATIVE  Hgb urine dipstick     NEGATIVE LARGE (A)  Bilirubin Urine     NEGATIVE NEGATIVE  Ketones, ur     NEGATIVE mg/dL NEGATIVE  Protein     NEGATIVE mg/dL 100 (A)  Nitrite     NEGATIVE NEGATIVE  Leukocytes, UA     NEGATIVE NEGATIVE  RBC / HPF     0 - 5 RBC/hpf 21-50  WBC, UA     0 - 5 WBC/hpf 6-10  Bacteria, UA     NONE SEEN NONE SEEN  Squamous Epithelial / LPF     0 - 5 NONE SEEN  Hyaline Casts, UA      PRESENT  Granular Casts, UA      PRESENT  Amorphous Crystal      PRESENT    Radiologic Imaging: Ct Abdomen Pelvis Wo Contrast  Result Date: 04/25/2018 CLINICAL DATA:  Elevated liver functions and elevated serum creatinine. Unresponsive. EXAM: CT ABDOMEN AND PELVIS WITHOUT CONTRAST TECHNIQUE: Multidetector CT imaging of the abdomen and pelvis was performed following the standard protocol without oral or IV contrast. COMPARISON:  None. FINDINGS: Lower chest: There is bibasilar atelectasis with small bilateral pleural effusions. No pneumothorax in the lung bases. There are foci of coronary artery calcification. Hepatobiliary: No focal liver lesions are appreciable on this noncontrast enhanced study. Gallbladder is distended without appreciable wall thickening. There is no biliary duct dilatation. Pancreas: No evident pancreatic mass or inflammatory focus. Spleen: No splenic lesions are appreciable. Adrenals/Urinary Tract: Adrenals bilaterally appear normal. There is an apparent cyst arising from the upper pole of the right kidney measuring 2.5 x 2.5 cm. There is scarring  in the upper pole of the right kidney. There is severe hydronephrosis on the right. There is a cyst in the lower pole of the left kidney measuring 1.2 x 1.2 cm. There is a probable hyperdense cyst arising from the upper pole left kidney measuring 4 x 4 mm. There is no appreciable hydronephrosis on the left. There is a 3 mm calculus in the lower pole of the right kidney. There is no evident ureteral calculus on either side. Urinary bladder is decompressed with a Foley catheter. Air in the urinary  bladder is likely due to recent instrumentation. Stomach/Bowel: Patient has had a previous hemicolectomy. Postoperative changes with bowel anastomosis site appearing patent. There is no appreciable bowel wall or mesenteric thickening. No evident bowel obstruction. No appreciable free air or portal venous air. Nasogastric tube tip is in the stomach. Vascular/Lymphatic: There are foci aortoiliac atherosclerosis. No aneurysm evident. Major mesenteric arterial vessels appear patent on this noncontrast enhanced study. There is no evident adenopathy in the abdomen or pelvis. Reproductive: There are small prostatic calculi. Prostate and seminal vesicles appear normal in size and contour. No pelvic mass is evident. Other: There is nonspecific stranding in the lateral abdominopelvic junction regions bilaterally. There is no associated mass or fluid. These may be postoperative changes. There is no appreciable abscess or ascites in the abdomen or pelvis. No appreciable omental lesions. Musculoskeletal: There is degenerative change in each hip joint. No blastic or lytic bone lesions are identified. No evident fracture or dislocation. No intramuscular lesions are appreciable. No abdominal wall lesions are appreciable. IMPRESSION: 1. Postoperative changes with evidence of previous hemicolectomy on the right. Anastomosis site appears patent. No bowel obstruction. No abscess in the abdomen or pelvis evident. Nasogastric tube tip in stomach.  2. Severe hydronephrosis on the right without obstructing focus evident. Question ureteropelvic junction on the right given the overall appearance. This finding may warrant nonemergent nuclear medicine Lasix renogram to further assess. 3. Nonobstructing calculus lower pole right kidney measuring 3 mm. Urinary bladder decompressed with Foley catheter. Air in the bladder is likely secondary to recent instrumentation. Prostatic calcifications noted. 4. There is aortic and iliac artery atherosclerosis. There are foci of coronary artery calcification. Aortic Atherosclerosis (ICD10-I70.0). Electronically Signed   By: Lowella Grip III M.D.   On: 04/25/2018 09:57   Ct Head Wo Contrast  Result Date: 04/25/2018 CLINICAL DATA:  Unresponsive EXAM: CT HEAD WITHOUT CONTRAST TECHNIQUE: Contiguous axial images were obtained from the base of the skull through the vertex without intravenous contrast. COMPARISON:  None. FINDINGS: Brain: No evidence of acute infarction, hemorrhage, extra-axial collection, ventriculomegaly, or mass effect. Encephalomalacia of the left inferior frontal lobe. 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 Generalized cerebral atrophy. Periventricular white matter low attenuation likely secondary to microangiopathy. Vascular: Cerebrovascular atherosclerotic calcifications are noted. Skull: Negative for fracture or focal lesion. Sinuses/Orbits: Visualized portions of the orbits are unremarkable. The mastoid sinuses are clear. Bilateral maxillary sinus mucosal thickening. Air-fluid level in the right sphenoid sinus. Other: None. IMPRESSION: 1. No acute intracranial pathology. 2. Chronic microvascular disease and cerebral atrophy. Electronically Signed   By: Kathreen Devoid   On: 04/25/2018 09:50   Dg Chest Portable 1 View  Result Date: 04/25/2018 CLINICAL DATA:  Hypoxia EXAM: PORTABLE CHEST 1 VIEW COMPARISON:  None. FINDINGS: Endotracheal tube tip is 4.5 cm above the carina. Nasogastric tube  tip and side port are in the stomach. No evident pneumothorax. There is a small left pleural effusion with left base atelectasis. Lungs elsewhere clear. Heart size and pulmonary vascularity are normal. No adenopathy. There is aortic atherosclerosis. No evident bone lesions. IMPRESSION: Tube positions as described without pneumothorax. Small pleural effusion with left base atelectasis. Lungs elsewhere clear. Heart size normal. There is aortic atherosclerosis. Aortic Atherosclerosis (ICD10-I70.0). Electronically Signed   By: Lowella Grip III M.D.   On: 04/25/2018 08:28   CT scan personally reviewed.  Agree with the level of obstruction is somewhere in the proximal ureter/UPJ.  It appears to be somewhat chronic slight atrophy on the side as well  as lack of stranding.  There is also some hyperdensity within the collecting system concerning for possible clot versus tumor.  Impression/Plan:  57 year old critically ill male found down/unresponsive and hypothermic with multiorgan dysfunction with a presumed diagnosis at this point of sepsis, possibly of urinary source.  CT abdomen and pelvis show severe right-sided hydronephrosis, possibly due to clot versus tumor versus versus congenital although less likely.   He also has a mildly suspicious urinalysis as well as significant leukocytosis.  Urology was asked to evaluate for need for intervention and urinary decompression of his right collecting system.  Case was discussed with Dr. Alva Garnet (pulmonary critical care), Dr. Ronelle Nigh (anesthesia), Dr. Barbie Banner (interventional radiology).  Initially, felt the patient will be best served with placement of a percutaneous nephrostomy tube in interventional radiology given his critical illness, however, his INR returned elevated. As such, plan to take the patient on the OR for attempted right ureteral stent placement.    Consent obtained from patients daughter.  He is already on broad spectrum abx.    Patient will  ultimately need diagnostic ureteroscopy to rule out underlying malignancy.  We will try to send urine cytology from the operating room today.   04/25/2018, 1:34 PM  Hollice Espy,  MD  I spent 80 min with this patient of which greater than 50% was spent in counseling and coordination of care with the patient.

## 2018-04-25 NOTE — ED Notes (Signed)
Back from CT scan

## 2018-04-25 NOTE — ED Triage Notes (Signed)
Pt brought in by [facility] after being found outside unresponsive. Last known well time is 1800 last night. Agonal respirations upon EMS arrival. Breathing assisted with BVM. Pupils were constricted and EMS administered 4 mg Narcan with no response. Pt remained with pulse throughout transport. NSR on arrival. CBG 256 mg/dL. Hx of CVA. Recently seen for knee pain and prescribed Tramadol per EMS.

## 2018-04-25 NOTE — ED Provider Notes (Signed)
[facility] Emergency Department Provider Note   ____________________________________________   First MD Initiated Contact with Patient 04/25/18 [phone]     (approximate)  I have reviewed the triage vital signs and the nursing notes.   HISTORY  Chief Complaint Altered Mental Status and Respiratory Distress   HPI Draysen Weygandt is a 57 y.o. male who was found lying up against the outside of his house.  Uncertain when he got there.  He is unresponsive.  EMS gave him Narcan without result.  Pupils are midposition.  He has slow respirations. He is unresponsive here.  Patient was given 6 more of Narcan IV with fluid bolus and then 2 of Versed and 100 of succinylcholine and intubated under direct vision.  ET tube was 23 at the lips with good bilateral breath sounds sats were not very good however they were 91 and dropping down so his ET tube was pulled back to 21 at the lips no real change.  Sats went up when they were pulse ox was taken off his finger and put on his ear however.  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  He smokes he is been depressed.  Past Medical History:  Diagnosis Date  . Anxiety   . Cancer [facility])    prostate  . Depression   . Hyperlipidemia   . Nerve pain   . Stroke [facility])     Patient Active Problem List   Diagnosis Date Noted  . Severe sepsis ([place]) 04/25/2018  . Sepsis ([place]) 04/25/2018    Past Surgical History:  Procedure Laterality Date  . COLON SURGERY     En bloc extended right hemicolectomy 07/2017  . tumor removed       Prior to Admission medications   Medication Sig Start Date End Date Taking? Authorizing Provider  atorvastatin (LIPITOR) 80 MG tablet Take 1 tablet (80 mg total) by mouth daily. 11/11/17  Yes [name], MD  escitalopram (LEXAPRO) 20 MG tablet Take 1 tablet (20 mg total) by mouth daily. 11/11/17  Yes [name], MD  gabapentin  (NEURONTIN) 100 MG capsule Take one to 2 tabs for pain 3 x day 11/11/17  Yes [name], MD  ibuprofen (ADVIL,MOTRIN) 200 MG tablet Take 200 mg by mouth every 6 (six) hours as needed.   Yes [provider]  traMADol (ULTRAM) 50 MG tablet Take 1 tablet (50 mg total) by mouth every 6 (six) hours as needed. 04/21/18 04/21/19 Yes Sable Feil, PA-C  furosemide (LASIX) 20 MG tablet Take 20 mg by mouth.    [provider]  QUEtiapine (SEROQUEL) 50 MG tablet Take 1 tablet (50 mg total) by mouth at bedtime. 11/11/17   [name], MD    Allergies Penicillins  Family History  Problem Relation Age of Onset  . Prostate cancer Father     Social History Social History   Tobacco Use  . Smoking status: Current Every Day Smoker    Packs/day: 1.00    Types: Cigarettes  . Smokeless tobacco: Never Used  Substance Use Topics  . Alcohol use: Never    Frequency: Never  . Drug use: Never    Review of Systems Unable to obtain due to altered mental status  ____________________________________________   PHYSICAL EXAM:  VITAL SIGNS: ED Triage Vitals  Enc Vitals Group     BP 04/25/18 0740 (!) 114/45     Pulse  Rate 04/25/18 0740 69     Resp 04/25/18 0740 (!) 8     Temp 04/25/18 0740 (!) 91.9 F (33.3 C)     Temp Source 04/25/18 0740 Rectal     SpO2 04/25/18 0740 (!) 89 %     Weight 04/25/18 0812 147 lb 0.8 oz (66.7 kg)     Height 04/25/18 0812 5\' 7"  (1.702 m)     Head Circumference --      Peak Flow --      Pain Score --      Pain Loc --      Pain Edu? --      Excl. in [place]? --     Constitutional: Unresponsive Eyes: Conjunctivae are normal.  Able to equal and round Head: Atraumatic. Nose: No congestion/rhinnorhea. Mouth/Throat: Very poor dentition no erythema in the [place]ck: No stridor. Cardiovascular: Normal rate, regular rhythm. Grossly normal heart sounds.  Good peripheral circulation. Respiratory: Decreased respiratory effort.  No retractions. Lungs  CTAB. Gastrointestinal: Soft and nontender. No distention. No abdominal bruits. No CVA tenderness. Musculoskeletal: No lower extremity tenderness nor edema.  Neurologic: One time he did move all his extremities slowly but then he stopped moving again.  He has not moved since.  He does not respond to anything. Skin:  Skin is warm, dry and intact.    ____________________________________________   LABS (all labs ordered are listed, but only abnormal results are displayed)  Labs Reviewed  CBC WITH DIFFERENTIAL/PLATELET - Abnormal; Notable for the following components:      Result Value   WBC 17.1 (*)    RBC 4.04 (*)    Hemoglobin 6.4 (*)    HCT 22.2 (*)    MCV 55.0 (*)    MCH 15.9 (*)    MCHC 28.8 (*)    RDW 22.9 (*)    Platelets 522 (*)    nRBC 8 (*)    Neutro Abs 14.9 (*)    Basophils Absolute 0.2 (*)    All other components within normal limits  COMPREHENSIVE METABOLIC PANEL - Abnormal; Notable for the following components:   Glucose, Bld 209 (*)    BUN 88 (*)    Creatinine, Ser 3.42 (*)    Calcium 7.6 (*)    Albumin 2.9 (*)    AST 1,155 (*)    ALT 283 (*)    GFR calc non Af Amer 18 (*)    GFR calc Af Amer 21 (*)    All other components within normal limits  TROPONIN I - Abnormal; Notable for the following components:   Troponin I 0.13 (*)    All other components within normal limits  BRAIN NATRIURETIC PEPTIDE - Abnormal; Notable for the following components:   B Natriuretic Peptide 142.0 (*)    All other components within normal limits  BLOOD GAS, ARTERIAL - Abnormal; Notable for the following components:   pH, Arterial 7.25 (*)    pO2, Arterial 75 (*)    Bicarbonate 15.8 (*)    Acid-base deficit 10.6 (*)    All other components within normal limits  ACETAMINOPHEN LEVEL - Abnormal; Notable for the following components:   Acetaminophen (Tylenol), Serum <10 (*)    All other components within normal limits  LIPASE, BLOOD - Abnormal; Notable for the following  components:   Lipase 71 (*)    All other components within normal limits  URINALYSIS, COMPLETE (UACMP) WITH MICROSCOPIC - Abnormal; Notable for the following components:   Color, Urine AMBER (*)  APPearance TURBID (*)    Hgb urine dipstick LARGE (*)    Protein, ur 100 (*)    All other components within normal limits  CK - Abnormal; Notable for the following components:   Total CK 18,913 (*)    All other components within normal limits  PROTIME-INR - Abnormal; Notable for the following components:   Prothrombin Time 25.0 (*)    All other components within normal limits  BLOOD GAS, ARTERIAL - Abnormal; Notable for the following components:   pH, Arterial 7.27 (*)    pO2, Arterial 44 (*)    Bicarbonate 14.7 (*)    Acid-base deficit 11.0 (*)    Allens test (pass/fail) POSITIVE (*)    All other components within normal limits  GLUCOSE, CAPILLARY - Abnormal; Notable for the following components:   Glucose-Capillary 128 (*)    All other components within normal limits  APTT - Abnormal; Notable for the following components:   aPTT 42 (*)    All other components within normal limits  MRSA PCR SCREENING  CULTURE, BLOOD (ROUTINE X 2)  CULTURE, BLOOD (ROUTINE X 2)  URINE CULTURE  CULTURE, RESPIRATORY  URINE DRUG SCREEN, QUALITATIVE ([facility] ONLY)  ETHANOL  SALICYLATE LEVEL  TRIGLYCERIDES  LACTIC ACID, PLASMA  CBC  BASIC METABOLIC PANEL  HIV ANTIBODY (ROUTINE TESTING W REFLEX)  SAMPLE TO BLOOD BANK  TYPE AND SCREEN  PREPARE RBC (CROSSMATCH)  ABO/RH  PREPARE FRESH FROZEN PLASMA   ____________________________________________  EKG  KG read interpreted by me shows normal sinus rhythm 86 dermal axis no acute ST-T wave changes ____________________________________________  RADIOLOGY  ED MD interpretation: Chest x-ray shows ET tube in good position NG tube in good position radiology reports a small pleural effusion of the left  Official radiology report(s): Ct Abdomen Pelvis Wo  Contrast  Result Date: 04/25/2018 CLINICAL DATA:  Elevated liver functions and elevated serum creatinine. Unresponsive. EXAM: CT ABDOMEN AND PELVIS WITHOUT CONTRAST TECHNIQUE: Multidetector CT imaging of the abdomen and pelvis was performed following the standard protocol without oral or IV contrast. COMPARISON:  None. FINDINGS: Lower chest: There is bibasilar atelectasis with small bilateral pleural effusions. No pneumothorax in the lung bases. There are foci of coronary artery calcification. Hepatobiliary: No focal liver lesions are appreciable on this noncontrast enhanced study. Gallbladder is distended without appreciable wall thickening. There is no biliary duct dilatation. Pancreas: No evident pancreatic mass or inflammatory focus. Spleen: No splenic lesions are appreciable. Adrenals/Urinary Tract: Adrenals bilaterally appear normal. There is an apparent cyst arising from the upper pole of the right kidney measuring 2.5 x 2.5 cm. There is scarring in the upper pole of the right kidney. There is severe hydronephrosis on the right. There is a cyst in the lower pole of the left kidney measuring 1.2 x 1.2 cm. There is a probable hyperdense cyst arising from the upper pole left kidney measuring 4 x 4 mm. There is no appreciable hydronephrosis on the left. There is a 3 mm calculus in the lower pole of the right kidney. There is no evident ureteral calculus on either side. Urinary bladder is decompressed with a Foley catheter. Air in the urinary bladder is likely due to recent instrumentation. Stomach/Bowel: Patient has had a previous hemicolectomy. Postoperative changes with bowel anastomosis site appearing patent. There is no appreciable bowel wall or mesenteric thickening. No evident bowel obstruction. No appreciable free air or portal venous air. Nasogastric tube tip is in the stomach. Vascular/Lymphatic: There are foci aortoiliac atherosclerosis. No aneurysm evident.  Major mesenteric arterial vessels appear  patent on this noncontrast enhanced study. There is no evident adenopathy in the abdomen or pelvis. Reproductive: There are small prostatic calculi. Prostate and seminal vesicles appear normal in size and contour. No pelvic mass is evident. Other: There is nonspecific stranding in the lateral abdominopelvic junction regions bilaterally. There is no associated mass or fluid. These may be postoperative changes. There is no appreciable abscess or ascites in the abdomen or pelvis. No appreciable omental lesions. Musculoskeletal: There is degenerative change in each hip joint. No blastic or lytic bone lesions are identified. No evident fracture or dislocation. No intramuscular lesions are appreciable. No abdominal wall lesions are appreciable. IMPRESSION: 1. Postoperative changes with evidence of previous hemicolectomy on the right. Anastomosis site appears patent. No bowel obstruction. No abscess in the abdomen or pelvis evident. Nasogastric tube tip in stomach. 2. Severe hydronephrosis on the right without obstructing focus evident. Question ureteropelvic junction on the right given the overall appearance. This finding may warrant nonemergent nuclear medicine Lasix renogram to further assess. 3. Nonobstructing calculus lower pole right kidney measuring 3 mm. Urinary bladder decompressed with Foley catheter. Air in the bladder is likely secondary to recent instrumentation. Prostatic calcifications noted. 4. There is aortic and iliac artery atherosclerosis. There are foci of coronary artery calcification. Aortic Atherosclerosis (ICD10-I70.0). Electronically Signed   By: Lowella Grip III M.D.   On: 04/25/2018 09:57   Ct Head Wo Contrast  Result Date: 04/25/2018 CLINICAL DATA:  Unresponsive EXAM: CT HEAD WITHOUT CONTRAST TECHNIQUE: Contiguous axial images were obtained from the base of the skull through the vertex without intravenous contrast. COMPARISON:  None. FINDINGS: Brain: No evidence of acute infarction,  hemorrhage, extra-axial collection, ventriculomegaly, or mass effect. Encephalomalacia of the left inferior frontal lobe. 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 Generalized cerebral atrophy. Periventricular white matter low attenuation likely secondary to microangiopathy. Vascular: Cerebrovascular atherosclerotic calcifications are noted. Skull: Negative for fracture or focal lesion. Sinuses/Orbits: Visualized portions of the orbits are unremarkable. The mastoid sinuses are clear. Bilateral maxillary sinus mucosal thickening. Air-fluid level in the right sphenoid sinus. Other: None. IMPRESSION: 1. No acute intracranial pathology. 2. Chronic microvascular disease and cerebral atrophy. Electronically Signed   By: Kathreen Devoid   On: 04/25/2018 09:50   Dg Chest Portable 1 View  Result Date: 04/25/2018 CLINICAL DATA:  Hypoxia EXAM: PORTABLE CHEST 1 VIEW COMPARISON:  None. FINDINGS: Endotracheal tube tip is 4.5 cm above the carina. Nasogastric tube tip and side port are in the stomach. No evident pneumothorax. There is a small left pleural effusion with left base atelectasis. Lungs elsewhere clear. Heart size and pulmonary vascularity are normal. No adenopathy. There is aortic atherosclerosis. No evident bone lesions. IMPRESSION: Tube positions as described without pneumothorax. Small pleural effusion with left base atelectasis. Lungs elsewhere clear. Heart size normal. There is aortic atherosclerosis. Aortic Atherosclerosis (ICD10-I70.0). Electronically Signed   By: Lowella Grip III M.D.   On: 04/25/2018 08:28    ____________________________________________   PROCEDURES  Procedure(s) performed: Patient intubated under direct vision with a 7 and half ET tube straight blade by me further described in HPI  Procedures  Critical Care performed: Critical care time 45 minutes.  This includes intubating the patient reviewing the patient's labs discussing the patient's CT scans with radiology  and then speaking with the hospitalist and checking on ICU bed status which we do have ICU beds  ____________________________________________   INITIAL IMPRESSION / ASSESSMENT AND PLAN / ED COURSE  Patient  is hypothermic we started him on a bear hugger.  We are giving the patient's fluids and after 1 L of his pressure does not come up we will start him on levo fed.  Discussed patient with Dr. Charlotte Crumb hospitalist.  Plan to get him in the ICU.  May do the central line in the ICU.  Radiology had initially been talking about doing an MRI to evaluate his hypodensities in the brain.  At this point the patient is not stable enough to go on an MRI.         ____________________________________________   FINAL CLINICAL IMPRESSION(S) / ED DIAGNOSES  Final diagnoses:  Respiratory failure ([place])  Hydronephrosis     ED Discharge Orders    None       Note:  This document was prepared using Dragon voice recognition software and may include unintentional dictation errors.    Nena Polio, MD 04/25/18 [phone]

## 2018-04-25 NOTE — Consult Note (Signed)
PULMONARY / CRITICAL CARE MEDICINE   NAME:  Chad [name], MRN:  [id number], DOB:  [birth year], LOS: 0 ADMISSION DATE:  04/25/2018, CONSULTATION DATE:  9/29  BRIEF HISTORY:    33 M found unresponsive outside of his home.  EMS was dispatched and he received naloxone without change in cognition.  Brought to emergency department where he underwent intubation.  Noted to be hypothermic.CTAP demonstrated severe right hydronephrosis.  Admission diagnosis of severe sepsis/septic shock likely due to urinary tract infection/right hydronephrosis.  PMH of hepatitis C and partial colectomy for colon mass.   HISTORY OF PRESENT ILLNESS   Level 5 caveat.  Patient intubated.  No family at bedside.  SIGNIFICANT PAST MEDICAL HISTORY    Past Medical History:  Diagnosis Date  . Anxiety   . Cancer [facility])    prostate  . Depression   . Hyperlipidemia   . Nerve pain   . Stroke [facility])     SOCIAL HISTORY:  He  reports that he has been smoking cigarettes. He has been smoking about 1.00 pack per day. He has never used smokeless tobacco. He reports that he does not drink alcohol or use drugs.  FH: [place]  ROS: Level 5 caveat   SIGNIFICANT EVENTS:  9/29 admission as documented above.  Right nephrostomy tube requested of IR   STUDIES:   9/29 CT head: No acute findings 9/26 CTAP: Postop changes of previous hemicolectomy.  Severe right hydronephrosis.   CULTURES:  Urine 9/29>>  Resp 9/29>>  Blood 9/29 >>    ANTIBIOTICS:  Vancomycin 9/29 >>  Cefepime 9/29 >>   LINES/TUBES:  ETT 9/29 >>   CONSULTANTS:    SUBJECTIVE:    CONSTITUTIONAL: BP (!) 102/56   Pulse 99   Temp (!) 96.6 F (35.9 C)   Resp 15   Ht '5\' 7"'  (1.702 m)   Wt 66.7 kg   SpO2 100%   BMI 23.03 kg/m   No intake/output data recorded.     Vent Mode: AC FiO2 (%):  [45 %-85 %] 60 % Set Rate:  [16 bmp] 16 bmp Vt Set:  [500 mL] 500 mL PEEP:  [5 cmH20] 5 cmH20  PHYSICAL EXAM: General: Intubated, unresponsive Neuro: CNs  intact, minimal withdrawal of all extremities  HEENT: pupils pinpoint, NCAT, sclerae white Cardiovascular: Regular, sinus rhythm, no murmur noted Lungs: Clear anteriorly without wheezes other adventitious sounds Abdomen: Soft, no palpable masses, diminished bowel sounds Extremities: Cool, no edema Skin: No lesions noted  BMP Latest Ref Rng & Units 04/25/2018  Glucose 70 - 99 mg/dL 209(H)  BUN 6 - 20 mg/dL 88(H)  Creatinine 0.61 - 1.24 mg/dL 3.42(H)  Sodium 135 - 145 mmol/L 136  Potassium 3.5 - 5.1 mmol/L 4.7  Chloride 98 - 111 mmol/L 102  CO2 22 - 32 mmol/L 22  Calcium 8.9 - 10.3 mg/dL 7.6(L)   Hepatic Function Latest Ref Rng & Units 04/25/2018  Total Protein 6.5 - 8.1 g/dL 7.5  Albumin 3.5 - 5.0 g/dL 2.9(L)  AST 15 - 41 U/L 1,155(H)  ALT 0 - 44 U/L 283(H)  Alk Phosphatase 38 - 126 U/L 96  Total Bilirubin 0.3 - 1.2 mg/dL 0.9   CBC Latest Ref Rng & Units 04/25/2018  WBC 3.8 - 10.6 K/uL 17.1(H)  Hemoglobin 13.0 - 18.0 g/dL 6.4(L)  Hematocrit 40.0 - 52.0 % 22.2(L)  Platelets 150 - 440 K/uL 522(H)    ABG    Component Value Date/Time   PHART 7.25 (L) 04/25/2018 0904   PCO2ART  36 04/25/2018 0904   PO2ART 75 (L) 04/25/2018 0904   HCO3 15.8 (L) 04/25/2018 0904   ACIDBASEDEF 10.6 (H) 04/25/2018 0904   O2SAT 92.2 04/25/2018 0904     CXR: No acute findings  RESOLVED PROBLEM LIST     ASSESSMENT AND PLAN   1) ventilator dependent respiratory failure.  Intubated for altered mental status 2) septic shock 3) acute kidney injury.  Baseline creatinine unknown 4) right hydronephrosis 5) history of hepatitis C. 6) elevated transaminases.  Likely related to shock and hepatitis C history 7) severe sepsis with hypothermia, MODS, hypotension likely due to UTI/hydronephrosis 8) severe anemia without obvious source of blood loss 9) Acute encephalopathy - presumed due to severe sepsis  Vent settings established Vent bundle implemented Daily SBT as indicated Volume resuscitation  ordered Norepinephrine as needed to maintain MAP >65 mmHg Monitor BMET intermittently Monitor I/Os Correct electrolytes as indicated  Repeat BMET this afternoon Percutaneous nephrostomy tube requested of IR Maintenance IV fluids ordered Monitor LFTs for resolution. Transfuse 1 unit PRBCs.  Repeat CBC this afternoon Monitor temp, WBC count Micro and abx as above  PAD protocol-propofol, intermittent fentanyl.  RASS goal -1, -2 Daily wake up assessment   No family available to discuss care plan  CCM time: 45 mins  The above time includes time spent in consultation with patient and/or family members and reviewing care plan on multidisciplinary rounds  Merton Border, MD PCCM service Mobile [phone] Pager [phone] 04/25/2018 11:41 AM

## 2018-04-25 NOTE — H&P (Signed)
[facility] NAME: Chad [name]    MR#:  [id number]  DATE OF BIRTH:  Dec 17, 1960  DATE OF ADMISSION:  04/25/2018  PRIMARY CARE PHYSICIAN: [name], MD   REQUESTING/REFERRING PHYSICIAN: Cinda Quest  CHIEF COMPLAINT:   Chief Complaint  Patient presents with  . Altered Mental Status  . Respiratory Distress    HISTORY OF PRESENT ILLNESS: Chad [name]  is a 57 y.o. male with a known history of anxiety, colon cancer status post hemicolectomy, depression, hyperlipidemia, recent stroke-lives in [place] and visiting his son over here. He had some complaint of shortness of breath last week and he is getting increasingly confused for last 1 week.  Son had sent him to emergency room because of that complaint but when he came to ER he was little confused and his only complaint mentioned was left knee pain, which is chronic for last 3 years.  So he was sent home with some pain medications.  He continued to be short of breath and son went out last evening to meet his friends and returned home very late last night or early morning.  When he came he found the patient outside of the house and not responding.  He called EMS and they noted him to be hypotensive, hypothermic, acidotic in respiratory distress. Brought to the emergency room and intubated because he was septic and lethargic. Also noted to have elevated white blood cell count and low hemoglobin with elevated LFTs in ER.  PAST MEDICAL HISTORY:   Past Medical History:  Diagnosis Date  . Anxiety   . Cancer [facility])    prostate  . Depression   . Hyperlipidemia   . Nerve pain   . Stroke [facility])     PAST SURGICAL HISTORY:  Past Surgical History:  Procedure Laterality Date  . COLON SURGERY     En bloc extended right hemicolectomy 07/2017  . tumor removed       SOCIAL HISTORY:  Social History   Tobacco Use  . Smoking status: Current Every Day Smoker    Packs/day: 1.00    Types:  Cigarettes  . Smokeless tobacco: Never Used  Substance Use Topics  . Alcohol use: Never    Frequency: Never    FAMILY HISTORY:  Family History  Problem Relation Age of Onset  . Prostate cancer Father     DRUG ALLERGIES:  Allergies  Allergen Reactions  . Penicillins Rash    REVIEW OF SYSTEMS:   Patient is intubated and cannot give review of system.  MEDICATIONS AT HOME:  Prior to Admission medications   Medication Sig Start Date End Date Taking? Authorizing Provider  atorvastatin (LIPITOR) 80 MG tablet Take 1 tablet (80 mg total) by mouth daily. 11/11/17  Yes [name], MD  escitalopram (LEXAPRO) 20 MG tablet Take 1 tablet (20 mg total) by mouth daily. 11/11/17  Yes [name], MD  gabapentin (NEURONTIN) 100 MG capsule Take one to 2 tabs for pain 3 x day 11/11/17  Yes [name], MD  ibuprofen (ADVIL,MOTRIN) 200 MG tablet Take 200 mg by mouth every 6 (six) hours as needed.   Yes [provider]  traMADol (ULTRAM) 50 MG tablet Take 1 tablet (50 mg total) by mouth every 6 (six) hours as needed. 04/21/18 04/21/19 Yes Sable Feil, PA-C  furosemide (LASIX) 20 MG tablet Take 20 mg by mouth.    [provider]  QUEtiapine (SEROQUEL) 50 MG tablet  Take 1 tablet (50 mg total) by mouth at bedtime. 11/11/17   [name], MD      PHYSICAL EXAMINATION:   VITAL SIGNS: Blood pressure (!) 91/56, pulse 96, temperature 99 F (37.2 C), temperature source Bladder, resp. rate 12, height 6' (1.829 m), weight 68.4 kg, SpO2 100 %.  GENERAL:  57 y.o.-year-old patient lying in the bed critical appearing. EYES: Pupils equal, round, reactive to light . No scleral icterus.  HEENT: Head atraumatic, normocephalic. Oropharynx and nasopharynx clear.  NECK:  Supple, no jugular venous distention. No thyroid enlargement, no tenderness.  LUNGS: Normal breath sounds bilaterally, no wheezing, rales,rhonchi or crepitation. No use of accessory muscles of respiration.  ET tube in place  and on vent management. CARDIOVASCULAR: S1, S2 normal. No murmurs, rubs, or gallops.  ABDOMEN: Soft, nontender, nondistended. Bowel sounds present. No organomegaly or mass.  Surgical scar present. EXTREMITIES: No pedal edema, cyanosis, or clubbing.  NEUROLOGIC: Sedated on ventilatory support. PSYCHIATRIC: The patient is sedated.  SKIN: No obvious rash, lesion, or ulcer.   LABORATORY PANEL:   CBC Recent Labs  Lab 04/25/18 0751  WBC 17.1*  HGB 6.4*  HCT 22.2*  PLT 522*  MCV 55.0*  MCH 15.9*  MCHC 28.8*  RDW 22.9*  LYMPHSABS 1.5  MONOABS 0.5  EOSABS 0.0  BASOSABS 0.2*   ------------------------------------------------------------------------------------------------------------------  Chemistries  Recent Labs  Lab 04/25/18 0751  NA 136  K 4.7  CL 102  CO2 22  GLUCOSE 209*  BUN 88*  CREATININE 3.42*  CALCIUM 7.6*  AST 1,155*  ALT 283*  ALKPHOS 96  BILITOT 0.9   ------------------------------------------------------------------------------------------------------------------ estimated creatinine clearance is 22.5 mL/min (A) (by C-G formula based on SCr of 3.42 mg/dL (H)). ------------------------------------------------------------------------------------------------------------------ No results for input(s): TSH, T4TOTAL, T3FREE, THYROIDAB in the last 72 hours.  Invalid input(s): FREET3   Coagulation profile Recent Labs  Lab 04/25/18 0744  INR 2.29   ------------------------------------------------------------------------------------------------------------------- No results for input(s): DDIMER in the last 72 hours. -------------------------------------------------------------------------------------------------------------------  Cardiac Enzymes Recent Labs  Lab 04/25/18 0751  TROPONINI 0.13*   ------------------------------------------------------------------------------------------------------------------ Invalid input(s):  POCBNP  ---------------------------------------------------------------------------------------------------------------  Urinalysis    Component Value Date/Time   COLORURINE AMBER (A) 04/25/2018 0943   APPEARANCEUR TURBID (A) 04/25/2018 0943   LABSPEC 1.017 04/25/2018 0943   PHURINE 5.0 04/25/2018 0943   GLUCOSEU NEGATIVE 04/25/2018 0943   HGBUR LARGE (A) 04/25/2018 0943   BILIRUBINUR NEGATIVE 04/25/2018 0943   KETONESUR NEGATIVE 04/25/2018 0943   PROTEINUR 100 (A) 04/25/2018 0943   NITRITE NEGATIVE 04/25/2018 0943   LEUKOCYTESUR NEGATIVE 04/25/2018 0943     RADIOLOGY: Ct Abdomen Pelvis Wo Contrast  Result Date: 04/25/2018 CLINICAL DATA:  Elevated liver functions and elevated serum creatinine. Unresponsive. EXAM: CT ABDOMEN AND PELVIS WITHOUT CONTRAST TECHNIQUE: Multidetector CT imaging of the abdomen and pelvis was performed following the standard protocol without oral or IV contrast. COMPARISON:  None. FINDINGS: Lower chest: There is bibasilar atelectasis with small bilateral pleural effusions. No pneumothorax in the lung bases. There are foci of coronary artery calcification. Hepatobiliary: No focal liver lesions are appreciable on this noncontrast enhanced study. Gallbladder is distended without appreciable wall thickening. There is no biliary duct dilatation. Pancreas: No evident pancreatic mass or inflammatory focus. Spleen: No splenic lesions are appreciable. Adrenals/Urinary Tract: Adrenals bilaterally appear normal. There is an apparent cyst arising from the upper pole of the right kidney measuring 2.5 x 2.5 cm. There is scarring in the upper pole of the right kidney. There is severe hydronephrosis on  the right. There is a cyst in the lower pole of the left kidney measuring 1.2 x 1.2 cm. There is a probable hyperdense cyst arising from the upper pole left kidney measuring 4 x 4 mm. There is no appreciable hydronephrosis on the left. There is a 3 mm calculus in the lower pole of the  right kidney. There is no evident ureteral calculus on either side. Urinary bladder is decompressed with a Foley catheter. Air in the urinary bladder is likely due to recent instrumentation. Stomach/Bowel: Patient has had a previous hemicolectomy. Postoperative changes with bowel anastomosis site appearing patent. There is no appreciable bowel wall or mesenteric thickening. No evident bowel obstruction. No appreciable free air or portal venous air. Nasogastric tube tip is in the stomach. Vascular/Lymphatic: There are foci aortoiliac atherosclerosis. No aneurysm evident. Major mesenteric arterial vessels appear patent on this noncontrast enhanced study. There is no evident adenopathy in the abdomen or pelvis. Reproductive: There are small prostatic calculi. Prostate and seminal vesicles appear normal in size and contour. No pelvic mass is evident. Other: There is nonspecific stranding in the lateral abdominopelvic junction regions bilaterally. There is no associated mass or fluid. These may be postoperative changes. There is no appreciable abscess or ascites in the abdomen or pelvis. No appreciable omental lesions. Musculoskeletal: There is degenerative change in each hip joint. No blastic or lytic bone lesions are identified. No evident fracture or dislocation. No intramuscular lesions are appreciable. No abdominal wall lesions are appreciable. IMPRESSION: 1. Postoperative changes with evidence of previous hemicolectomy on the right. Anastomosis site appears patent. No bowel obstruction. No abscess in the abdomen or pelvis evident. Nasogastric tube tip in stomach. 2. Severe hydronephrosis on the right without obstructing focus evident. Question ureteropelvic junction on the right given the overall appearance. This finding may warrant nonemergent nuclear medicine Lasix renogram to further assess. 3. Nonobstructing calculus lower pole right kidney measuring 3 mm. Urinary bladder decompressed with Foley catheter. Air  in the bladder is likely secondary to recent instrumentation. Prostatic calcifications noted. 4. There is aortic and iliac artery atherosclerosis. There are foci of coronary artery calcification. Aortic Atherosclerosis (ICD10-I70.0). Electronically Signed   By: Lowella Grip III M.D.   On: 04/25/2018 09:57   Ct Head Wo Contrast  Result Date: 04/25/2018 CLINICAL DATA:  Unresponsive EXAM: CT HEAD WITHOUT CONTRAST TECHNIQUE: Contiguous axial images were obtained from the base of the skull through the vertex without intravenous contrast. COMPARISON:  None. FINDINGS: Brain: No evidence of acute infarction, hemorrhage, extra-axial collection, ventriculomegaly, or mass effect. Encephalomalacia of the left inferior frontal lobe. 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 Generalized cerebral atrophy. Periventricular white matter low attenuation likely secondary to microangiopathy. Vascular: Cerebrovascular atherosclerotic calcifications are noted. Skull: Negative for fracture or focal lesion. Sinuses/Orbits: Visualized portions of the orbits are unremarkable. The mastoid sinuses are clear. Bilateral maxillary sinus mucosal thickening. Air-fluid level in the right sphenoid sinus. Other: None. IMPRESSION: 1. No acute intracranial pathology. 2. Chronic microvascular disease and cerebral atrophy. Electronically Signed   By: Kathreen Devoid   On: 04/25/2018 09:50   Dg Chest Portable 1 View  Result Date: 04/25/2018 CLINICAL DATA:  Hypoxia EXAM: PORTABLE CHEST 1 VIEW COMPARISON:  None. FINDINGS: Endotracheal tube tip is 4.5 cm above the carina. Nasogastric tube tip and side port are in the stomach. No evident pneumothorax. There is a small left pleural effusion with left base atelectasis. Lungs elsewhere clear. Heart size and pulmonary vascularity are normal. No adenopathy. There is aortic  atherosclerosis. No evident bone lesions. IMPRESSION: Tube positions as described without pneumothorax. Small pleural  effusion with left base atelectasis. Lungs elsewhere clear. Heart size normal. There is aortic atherosclerosis. Aortic Atherosclerosis (ICD10-I70.0). Electronically Signed   By: Lowella Grip III M.D.   On: 04/25/2018 08:28    EKG: Orders placed or performed during the hospital encounter of 04/25/18  . EKG 12-Lead  . EKG 12-Lead  . EKG 12-Lead  . EKG 12-Lead    IMPRESSION AND PLAN:  *Septic shock, altered mental status due to metabolic encephalopathy secondary to sepsis Acute respiratory failure secondary to altered mental status, intubation.  IV fluids, follow lactic acid and cultures. Urinalysis and urine culture. Chest x-ray does not show any infiltrate.  Patient is a chronic smoker and have COPD. CT abdomen shows severe right-sided hydronephrosis possible obstruction and this is possibly the source of his sepsis.  Status post intubation and vent management in ICU. Empiric antibiotic broad-spectrum. Spoke to urologist and IR for possible decompression of right-sided hydronephrosis.  *Elevated LFTs Likely due to septic shock, continue to monitor. Patient with chronic hepatitis C.  *Microcytic anemia As per son, since patient had colon cancer and surgery he is losing blood and he requires to go for blood transfusion every few months. Transfuse currently and continue to monitor. Avoid anticoagulants.  *Acute renal failure Likely secondary to obstruction Continue to monitor with IV fluids and decompression procedure as per urology and IR.  *COPD and chronic smoking Currently no exacerbation symptoms, may continue bronchodilators.    All the records are reviewed and case discussed with ED provider. Management plans discussed with the patient, family and they are in agreement.  CODE STATUS: Full code    Code Status Orders  (From admission, onward)         Start     Ordered   04/25/18 1153  Full code  Continuous     04/25/18 1152        Code Status History     This patient has a current code status but no historical code status.     Discussed with patient's son and ICU physician in the room.  TOTAL TIME TAKING CARE OF THIS PATIENT: 50 critical care minutes.    Vaughan Basta M.D on 04/25/2018   Between 7am to 6pm - Pager - [phone]  After 6pm go to [email or web address] - password [email or web address] [facility]  Office  [phone]  CC: Primary care physician; [name], MD   Note: This dictation was prepared with Dragon dictation along with smaller phrase technology. Any transcriptional errors that result from this process are unintentional.

## 2018-04-25 NOTE — ED Notes (Signed)
Verbal order from Dr. Cinda Quest to administer 0.5 mg Ativan for agitation followed by additional 0.5 mg Ativan. Read back of order.

## 2018-04-25 NOTE — Progress Notes (Signed)
Spoke to urologist Dr. Erlene Quan for severe hydronephrosis and Sepsis. She will see the pt and make recommendations.

## 2018-04-25 NOTE — Progress Notes (Signed)
Patient's MAP has been trending down - now 56. This nurse called Dr. Alva Garnet who ordered a 1L bolus of 0.9% NS, to start levophed, and to send down a new post-op [place] & culture. Verbal read back done. Orders carried out.

## 2018-04-25 NOTE — Transfer of Care (Signed)
Immediate Anesthesia Transfer of Care Note  Patient: Chad [name]  Procedure(s) Performed: [place] (Right )  Patient Location: ICU  Anesthesia Type:General  Level of Consciousness: sedated  Airway & Oxygen Therapy: Patient remains intubated per anesthesia plan  Post-op Assessment: Report given to RN and Post -op Vital signs reviewed and stable  Post vital signs: Reviewed and stable  Last Vitals:  Vitals Value Taken Time  BP 77/48 04/25/2018  5:19 PM  Temp    Pulse 93 04/25/2018  5:22 PM  Resp 16 04/25/2018  5:23 PM  SpO2 100 % 04/25/2018  5:22 PM  Vitals shown include unvalidated device data.  Last Pain:  Vitals:   04/25/18 1613  TempSrc: Oral         Complications: No apparent anesthesia complications

## 2018-04-26 ENCOUNTER — Inpatient Hospital Stay: Payer: Medicaid Other

## 2018-04-26 ENCOUNTER — Encounter: Payer: Self-pay | Admitting: *Deleted

## 2018-04-26 DIAGNOSIS — J9601 Acute respiratory failure with hypoxia: Secondary | ICD-10-CM

## 2018-04-26 DIAGNOSIS — E43 Unspecified severe protein-calorie malnutrition: Secondary | ICD-10-CM

## 2018-04-26 LAB — CBC
HEMATOCRIT: 19.4 % — AB (ref 40.0–52.0)
HEMATOCRIT: 22.9 % — AB (ref 40.0–52.0)
HEMOGLOBIN: 7.6 g/dL — AB (ref 13.0–18.0)
Hemoglobin: 6 g/dL — ABNORMAL LOW (ref 13.0–18.0)
MCH: 17.9 pg — AB (ref 26.0–34.0)
MCH: 21.2 pg — AB (ref 26.0–34.0)
MCHC: 31 g/dL — ABNORMAL LOW (ref 32.0–36.0)
MCHC: 33.1 g/dL (ref 32.0–36.0)
MCV: 57.6 fL — ABNORMAL LOW (ref 80.0–100.0)
MCV: 63.9 fL — AB (ref 80.0–100.0)
PLATELETS: 270 10*3/uL (ref 150–440)
Platelets: 200 10*3/uL (ref 150–440)
RBC: 3.37 MIL/uL — AB (ref 4.40–5.90)
RBC: 3.58 MIL/uL — AB (ref 4.40–5.90)
RDW: 27.8 % — AB (ref 11.5–14.5)
RDW: 35.7 % — ABNORMAL HIGH (ref 11.5–14.5)
WBC: 11.8 10*3/uL — ABNORMAL HIGH (ref 3.8–10.6)
WBC: 7.9 10*3/uL (ref 3.8–10.6)

## 2018-04-26 LAB — BPAM FFP
BLOOD PRODUCT EXPIRATION DATE: 201910022359
Blood Product Expiration Date: 201910022359
ISSUE DATE / TIME: 201909292018
ISSUE DATE / TIME: 201909292310
Unit Type and Rh: 5100
Unit Type and Rh: 5100

## 2018-04-26 LAB — COMPREHENSIVE METABOLIC PANEL
ALK PHOS: 69 U/L (ref 38–126)
ALT: 157 U/L — AB (ref 0–44)
AST: 509 U/L — ABNORMAL HIGH (ref 15–41)
Albumin: 2.3 g/dL — ABNORMAL LOW (ref 3.5–5.0)
Anion gap: 8 (ref 5–15)
BUN: 92 mg/dL — ABNORMAL HIGH (ref 6–20)
CALCIUM: 6.7 mg/dL — AB (ref 8.9–10.3)
CHLORIDE: 108 mmol/L (ref 98–111)
CO2: 20 mmol/L — AB (ref 22–32)
CREATININE: 3.04 mg/dL — AB (ref 0.61–1.24)
GFR calc Af Amer: 24 mL/min — ABNORMAL LOW (ref >60)
GFR calc non Af Amer: 21 mL/min — ABNORMAL LOW (ref >60)
GLUCOSE: 148 mg/dL — AB (ref 70–99)
Potassium: 3.2 mmol/L — ABNORMAL LOW (ref 3.5–5.1)
SODIUM: 136 mmol/L (ref 135–145)
Total Bilirubin: 0.8 mg/dL (ref 0.3–1.2)
Total Protein: 5.5 g/dL — ABNORMAL LOW (ref 6.5–8.1)

## 2018-04-26 LAB — PREPARE FRESH FROZEN PLASMA
Unit division: 0
Unit division: 0

## 2018-04-26 LAB — PREPARE RBC (CROSSMATCH)

## 2018-04-26 LAB — URINE CULTURE: CULTURE: NO GROWTH

## 2018-04-26 LAB — GLUCOSE, CAPILLARY: GLUCOSE-CAPILLARY: 89 mg/dL (ref 70–99)

## 2018-04-26 LAB — CK: Total CK: 7916 U/L — ABNORMAL HIGH (ref 49–397)

## 2018-04-26 MED ORDER — FENTANYL 2500MCG IN NS 250ML (10MCG/ML) PREMIX INFUSION
25.0000 ug/h | INTRAVENOUS | Status: DC
  Administered 2018-04-26 – 2018-04-27 (×3): 200 ug/h via INTRAVENOUS
  Filled 2018-04-26 (×4): qty 250

## 2018-04-26 MED ORDER — POTASSIUM CHLORIDE 20 MEQ PO PACK
40.0000 meq | PACK | Freq: Once | ORAL | Status: AC
  Administered 2018-04-26: 40 meq
  Filled 2018-04-26: qty 2

## 2018-04-26 MED ORDER — VITAL AF 1.2 CAL PO LIQD
1000.0000 mL | ORAL | Status: DC
  Administered 2018-04-26 – 2018-04-27 (×2): 1000 mL

## 2018-04-26 MED ORDER — SODIUM CHLORIDE 0.9 % IV SOLN
1.0000 g | Freq: Once | INTRAVENOUS | Status: AC
  Administered 2018-04-26: 1 g via INTRAVENOUS
  Filled 2018-04-26: qty 10

## 2018-04-26 MED ORDER — SODIUM CHLORIDE 0.9% IV SOLUTION
Freq: Once | INTRAVENOUS | Status: AC
  Administered 2018-04-26: 04:00:00 via INTRAVENOUS

## 2018-04-26 MED ORDER — FENTANYL CITRATE (PF) 100 MCG/2ML IJ SOLN
50.0000 ug | Freq: Once | INTRAMUSCULAR | Status: AC
  Administered 2018-04-26: 50 ug via INTRAVENOUS

## 2018-04-26 MED ORDER — VITAL HIGH PROTEIN PO LIQD: 1000.0000 mL | ORAL | Status: DC

## 2018-04-26 MED ORDER — FUROSEMIDE 10 MG/ML IJ SOLN
20.0000 mg | Freq: Once | INTRAMUSCULAR | Status: AC
  Administered 2018-04-26: 20 mg via INTRAVENOUS
  Filled 2018-04-26: qty 2

## 2018-04-26 MED ORDER — SODIUM CHLORIDE 0.9 % IV SOLN
2.0000 g | INTRAVENOUS | Status: DC
  Administered 2018-04-26 – 2018-04-29 (×4): 2 g via INTRAVENOUS
  Filled 2018-04-26 (×5): qty 2

## 2018-04-26 MED ORDER — PANTOPRAZOLE SODIUM 40 MG IV SOLR
40.0000 mg | Freq: Two times a day (BID) | INTRAVENOUS | Status: DC
  Administered 2018-04-26 (×2): 40 mg via INTRAVENOUS
  Filled 2018-04-26 (×3): qty 40

## 2018-04-26 MED ORDER — FENTANYL BOLUS VIA INFUSION
50.0000 ug | INTRAVENOUS | Status: DC | PRN
  Administered 2018-04-26: 50 ug via INTRAVENOUS
  Filled 2018-04-26: qty 50

## 2018-04-26 NOTE — Progress Notes (Signed)
RN called and spoke with Patria Mane, NP and made her aware that patient's hgb is 6.0, NP gave order to give 20 mg of IV lasix first and then to give 2 units of blood each over 2.5 hours.

## 2018-04-26 NOTE — Anesthesia Postprocedure Evaluation (Signed)
Anesthesia Post Note  Patient: Chad [name]  Procedure(s) Performed: CYSTOSCOPY WITH STENT PLACEMENT (Right )  Patient location during evaluation: ICU Anesthesia Type: General Level of consciousness: sedated Pain management: pain level controlled Vital Signs Assessment: post-procedure vital signs reviewed and stable Respiratory status: respiratory function stable and patient on ventilator - see flowsheet for VS Cardiovascular status: blood pressure returned to baseline and stable Anesthetic complications: no Comments: Patient sedated and ventilated     Last Vitals:  Vitals:   04/26/18 0745 04/26/18 0800  BP:  97/62  Pulse:  76  Resp:  15  Temp:  (!) 35.8 C  SpO2: 100% 100%    Last Pain:  Vitals:   04/26/18 0800  TempSrc: Bladder                 Alison Stalling

## 2018-04-26 NOTE — Progress Notes (Signed)
Urology consult follow-up  Patient seen and examined today postop day 1 status post difficult right ureteral stent placement.  Adequate urine output.  Creatinine is slowly trending down.  Labs are beginning to normalize.    Patient remains intubated and sedated.  Foley catheter is draining well with concentrated appearing urine without clots.   Assessment/plan:  1.  Right hydroureteronephrosis-status post difficult Foley catheter placement with motor oil appearing return, urine cytology sent from the right upper tract.  Possible retroperitoneal fibrosis versus obstructing clot/tumor.  2.  Acute kidney injury-multifactorial.  Rhabdomyolysis.  Urinary obstruction.  Continue to trend creatinine, avoid nephrotoxins.  If creatinine begins to worsen, will repeat upper tract imaging to assess for stent function.  Hollice Espy, MD

## 2018-04-26 NOTE — Progress Notes (Signed)
CRITICAL CARE NOTE  CC  follow up respiratory failure  SUBJECTIVE Patient remains critically ill Prognosis is guarded On vent CK trending and down Creat trending down     SIGNIFICANT EVENTS    BP 93/60   Pulse 73   Temp (!) 97.3 F (36.3 C) (Bladder)   Resp 15   Ht 6' (1.829 m)   Wt 68.4 kg   SpO2 100%   BMI 20.45 kg/m    REVIEW OF SYSTEMS  PATIENT IS UNABLE TO PROVIDE COMPLETE REVIEW OF SYSTEMS DUE TO SEVERE CRITICAL ILLNESS   PHYSICAL EXAMINATION:  GENERAL:critically ill appearing, +resp distress HEAD: Normocephalic, atraumatic.  EYES: Pupils equal, round, reactive to light.  No scleral icterus.  MOUTH: Moist mucosal membrane. NECK: Supple. No thyromegaly. No nodules. No JVD.  PULMONARY: +rhonchi, +wheezing CARDIOVASCULAR: S1 and S2. Regular rate and rhythm. No murmurs, rubs, or gallops.  GASTROINTESTINAL: Soft, nontender, -distended. No masses. Positive bowel sounds. No hepatosplenomegaly.  MUSCULOSKELETAL: No swelling, clubbing, or edema.  NEUROLOGIC: obtunded, GCS<8 SKIN:intact,warm,dry      Indwelling Urinary Catheter continued, requirement due to   Reason to continue Indwelling Urinary Catheter for strict Intake/Output monitoring for hemodynamic instability         Ventilator continued, requirement due to, resp failure    Ventilator Sedation RASS 0 to -2     ASSESSMENT AND PLAN SYNOPSIS 57 yo with severe septic shock from acute RT hydronephrosis from acute pyelonephritis with severe resp failure and renal failure with rhabdomyolysis   Severe Hypoxic and Hypercapnic Respiratory Failure -continue Full MV support -continue Bronchodilator Therapy -Wean Fio2 and PEEP as tolerated -will perform SAT/SBT when respiratory parameters are met    Renal Failure-most likely due to ATN -follow chem 7 -follow UO -continue Foley Catheter-assess need daily   NEUROLOGY - intubated and sedated - minimal sedation to achieve a RASS goal:  -1 Wake up assessment pending   Septic shock -use vasopressors to keep MAP>65 -follow ABG and LA -follow up cultures -emperic ABX -consider stress dose steroids  CARDIAC ICU monitoring  ID -continue IV abx as prescibed -follow up cultures  GI/Nutrition GI PROPHYLAXIS as indicated Start TF's as tolerated Constipation protocol as indicated  ENDO - ICU hypoglycemic\Hyperglycemia protocol -check FSBS per protocol   ELECTROLYTES -follow labs as needed -replace as needed -pharmacy consultation and following   DVT/GI PRX ordered TRANSFUSIONS AS NEEDED MONITOR FSBS ASSESS the need for LABS as needed   Critical Care Time devoted to patient care services described in this note is 45 minutes.   Overall, patient is critically ill, prognosis is guarded.  Patient with Multiorgan failure and at high risk for cardiac arrest and death.    Corrin Parker, M.D.  [facility]  Medical Director [place] Director [facility] Cardio-Pulmonary Department

## 2018-04-26 NOTE — Progress Notes (Signed)
Pharmacy Antibiotic Note  Chad [name] is a 57 y.o. male admitted on 04/25/2018 with sepsis secondary to pyelonephritis. S/p cystoscopy with right ureteral stent placement and replacement of Foley catheter. Patient with thick motor oil like appearance to the urine and elevated CK. Pharmacy has been consulted for vancomycin and cefepime dosing.  Plan: Per CCM on ICU rounds 9/30 am, will discontinue vancomycin as most likely urinary source of sepsis  Patient received one dose of cefepime yesterday in the ED. Will start cefepime 2 g IV q24h  Height: 6' (182.9 cm) Weight: 150 lb 12.7 oz (68.4 kg) IBW/kg (Calculated) : 77.6  Temp (24hrs), Avg:98.1 F (36.7 C), Min:96.4 F (35.8 C), Max:99 F (37.2 C)  Recent Labs  Lab 04/25/18 0751 04/25/18 1214 04/25/18 2031 04/26/18 0118 04/26/18 0540  WBC 17.1*  --  18.2* 11.8*  --   CREATININE 3.42*  --  3.23*  --  3.04*  LATICACIDVEN  --  1.4  --   --   --     Estimated Creatinine Clearance: 25.3 mL/min (A) (by C-G formula based on SCr of 3.04 mg/dL (H)).    Allergies  Allergen Reactions  . Penicillins Rash    Antimicrobials this admission: Vancomycin 9/29 >> 9/30 Cefepime 9/29 >>  Dose adjustments this admission: N/A  Microbiology results: 9/29 BCx: pending 9/29 UCx: no growth 9/29 UCx: pending 9/29 MRSA PCR: negative  Thank you for allowing pharmacy to be a part of this patient's care.  Tawnya Crook, PharmD Pharmacy Resident  04/26/2018 11:21 AM

## 2018-04-26 NOTE — Progress Notes (Signed)
Initial Nutrition Assessment  DOCUMENTATION CODES:   Severe malnutrition in context of chronic illness  INTERVENTION:  Initiate Vital AF 1.2 at 20 mL/hr and advance by 20 mL/hr every 8 hours to goal rate of 60 mL/hr (1440 mL goal daily volume). Provides 1728 kcal, 108 grams of protein, 1166 mL H2O daily.  Provide free water flush of 30 mL Q4hrs to maintain tube patency.  Monitor magnesium, potassium, and phosphorus daily for at least 3 days, MD to replete as needed, as pt is at risk for refeeding syndrome given severe malnutrition.  NUTRITION DIAGNOSIS:   Severe Malnutrition related to chronic illness(hx CVA, hx prostate cancer) as evidenced by severe fat depletion, moderate muscle depletion, severe muscle depletion.  GOAL:   Provide needs based on ASPEN/SCCM guidelines  MONITOR:   Vent status, Labs, Weight trends, TF tolerance, I & O's  REASON FOR ASSESSMENT:   Ventilator    ASSESSMENT:   57 year old male with PMHx of CVA, HLD, depression, anxiety, prostate cancer admitted with severe septic shock, acute pyelonephritis, severe right hydronephrosis s/p cytoscopy and right ureteral stent placement 9/29, severe respiratory failure requiring intubation on 9/29, renal failure, rhabdomyolysis.   Patient intubated and sedated. On PRVC mode with FiO2 35% and PEEP 5 cmH2O at time of RD assessment this AM. No family members at bedside. Abdomen soft. Very limited weight history in chart. Patient was 63.5 kg on 11/11/2017, 66.7 kg on 04/21/2018, and is currently 68.4 kg (150.79 lbs).  Enteral Access: 16 Fr. OGT placed 9/29; terminates in stomach per CT Abd/Pelvis 9/29; 50 cm at corner of mouth  MAP: 70-78 mmHg  Patient is currently intubated on ventilator support Ve: 8.8 L/min Temp (24hrs), Avg:98.1 F (36.7 C), Min:96.4 F (35.8 C), Max:99 F (37.2 C)  Propofol: 12.3 mL/hr (325 kcal daily)  Medications reviewed and include: pantoprazole, cefepime, fentanyl gtt, propofol gtt still  running but plan is to discontinue it today and it has already been discontinued in [facility].  Labs reviewed: Potassium 3.2, CO2 20, BUN 92, Creatinine 3.04. Phosphorus 7.1 and Magnesium 3 on 9/29.  I/O: 1430 mL UOP yesterday  Discussed with RN and on rounds. Plan is to start tube feeds today.  NUTRITION - FOCUSED PHYSICAL EXAM:    Most Recent Value  Orbital Region  Severe depletion  Upper Arm Region  Severe depletion  Thoracic and Lumbar Region  Moderate depletion  Buccal Region  Unable to assess  Temple Region  Severe depletion  Clavicle Bone Region  Severe depletion  Clavicle and Acromion Bone Region  Moderate depletion  Scapular Bone Region  Unable to assess  Dorsal Hand  Severe depletion  Patellar Region  Moderate depletion  Anterior Thigh Region  Moderate depletion  Posterior Calf Region  Moderate depletion  Edema (RD Assessment)  None  Hair  Reviewed  Eyes  Unable to assess  Mouth  Unable to assess  Skin  Reviewed  Nails  Reviewed     Diet Order:   Diet Order            Diet NPO time specified  Diet effective now              EDUCATION NEEDS:   No education needs have been identified at this time  Skin:  Skin Assessment: Reviewed RN Assessment(petechiae)  Last BM:  Unknown/PTA  Height:   Ht Readings from Last 1 Encounters:  04/25/18 6' (1.829 m)    Weight:   Wt Readings from Last 1 Encounters:  04/25/18 68.4 kg    Ideal Body Weight:  80.9 kg  BMI:  Body mass index is 20.45 kg/m.  Estimated Nutritional Needs:   Kcal:  2417 (PSU 2003b w/ MSJ 1541, Ve 8.8, Tmax 37.2)  Protein:  95-110 grams (1.4-1.6 grams/kg)  Fluid:  2-2.4 L/day (30-35 mL/kg)  Willey Blade, MS, RD, LDN Office: [phone] Pager: [phone] After Hours/Weekend Pager: [phone]

## 2018-04-26 NOTE — Progress Notes (Signed)
[place] at [place] NAME: Chad [name]    MR#:  [id number]  DATE OF BIRTH:  09-13-1960  SUBJECTIVE:  CHIEF COMPLAINT:   Chief Complaint  Patient presents with  . Altered Mental Status  . Respiratory Distress   Brought with altered mental status and found to have sepsis, right-sided severe hydronephrosis and respiratory failure.  Intubated and currently on ventilatory support.  Procedures Intubation- 04/25/18 Right-sided ureteral stent 04/25/18 REVIEW OF SYSTEMS:   Patient is currently intubated and on ventilatory support so cannot give review of system.  ROS  DRUG ALLERGIES:   Allergies  Allergen Reactions  . Penicillins Rash    VITALS:  Blood pressure (!) 97/59, pulse 79, temperature 98.6 F (37 C), temperature source Bladder, resp. rate 16, height 6' (1.829 m), weight 68.4 kg, SpO2 100 %.  PHYSICAL EXAMINATION:   GENERAL:  57 y.o.-year-old patient lying in the bed critical appearing. EYES: Pupils equal, round, reactive to light . No scleral icterus.  HEENT: Head atraumatic, normocephalic. Oropharynx and nasopharynx clear.  NECK:  Supple, no jugular venous distention. No thyroid enlargement, no tenderness.  LUNGS: Normal breath sounds bilaterally, no wheezing, rales,rhonchi or crepitation. No use of accessory muscles of respiration.  ET tube in place and on vent management. CARDIOVASCULAR: S1, S2 normal. No murmurs, rubs, or gallops.  ABDOMEN: Soft, nontender, nondistended. Bowel sounds present. No organomegaly or mass.  Surgical scar present. EXTREMITIES: No pedal edema, cyanosis, or clubbing.  NEUROLOGIC: Sedated on ventilatory support. PSYCHIATRIC: The patient is sedated.  SKIN: No obvious rash, lesion, or ulcer.   Physical Exam LABORATORY PANEL:   CBC Recent Labs  Lab 04/26/18 1041  WBC 7.9  HGB 7.6*  HCT 22.9*  PLT 200    ------------------------------------------------------------------------------------------------------------------  Chemistries  Recent Labs  Lab 04/25/18 2125 04/26/18 0540  NA  --  136  K  --  3.2*  CL  --  108  CO2  --  20*  GLUCOSE  --  148*  BUN  --  92*  CREATININE  --  3.04*  CALCIUM  --  6.7*  MG 3.0*  --   AST  --  509*  ALT  --  157*  ALKPHOS  --  69  BILITOT  --  0.8   ------------------------------------------------------------------------------------------------------------------  Cardiac Enzymes Recent Labs  Lab 04/25/18 0751  TROPONINI 0.13*   ------------------------------------------------------------------------------------------------------------------  RADIOLOGY:  Ct Abdomen Pelvis Wo Contrast  Result Date: 04/25/2018 CLINICAL DATA:  Elevated liver functions and elevated serum creatinine. Unresponsive. EXAM: CT ABDOMEN AND PELVIS WITHOUT CONTRAST TECHNIQUE: Multidetector CT imaging of the abdomen and pelvis was performed following the standard protocol without oral or IV contrast. COMPARISON:  None. FINDINGS: Lower chest: There is bibasilar atelectasis with small bilateral pleural effusions. No pneumothorax in the lung bases. There are foci of coronary artery calcification. Hepatobiliary: No focal liver lesions are appreciable on this noncontrast enhanced study. Gallbladder is distended without appreciable wall thickening. There is no biliary duct dilatation. Pancreas: No evident pancreatic mass or inflammatory focus. Spleen: No splenic lesions are appreciable. Adrenals/Urinary Tract: Adrenals bilaterally appear normal. There is an apparent cyst arising from the upper pole of the right kidney measuring 2.5 x 2.5 cm. There is scarring in the upper pole of the right kidney. There is severe hydronephrosis on the right. There is a cyst in the lower pole of the left kidney measuring 1.2 x 1.2 cm. There is a probable hyperdense cyst arising from the upper pole  left  kidney measuring 4 x 4 mm. There is no appreciable hydronephrosis on the left. There is a 3 mm calculus in the lower pole of the right kidney. There is no evident ureteral calculus on either side. Urinary bladder is decompressed with a Foley catheter. Air in the urinary bladder is likely due to recent instrumentation. Stomach/Bowel: Patient has had a previous hemicolectomy. Postoperative changes with bowel anastomosis site appearing patent. There is no appreciable bowel wall or mesenteric thickening. No evident bowel obstruction. No appreciable free air or portal venous air. Nasogastric tube tip is in the stomach. Vascular/Lymphatic: There are foci aortoiliac atherosclerosis. No aneurysm evident. Major mesenteric arterial vessels appear patent on this noncontrast enhanced study. There is no evident adenopathy in the abdomen or pelvis. Reproductive: There are small prostatic calculi. Prostate and seminal vesicles appear normal in size and contour. No pelvic mass is evident. Other: There is nonspecific stranding in the lateral abdominopelvic junction regions bilaterally. There is no associated mass or fluid. These may be postoperative changes. There is no appreciable abscess or ascites in the abdomen or pelvis. No appreciable omental lesions. Musculoskeletal: There is degenerative change in each hip joint. No blastic or lytic bone lesions are identified. No evident fracture or dislocation. No intramuscular lesions are appreciable. No abdominal wall lesions are appreciable. IMPRESSION: 1. Postoperative changes with evidence of previous hemicolectomy on the right. Anastomosis site appears patent. No bowel obstruction. No abscess in the abdomen or pelvis evident. Nasogastric tube tip in stomach. 2. Severe hydronephrosis on the right without obstructing focus evident. Question ureteropelvic junction on the right given the overall appearance. This finding may warrant nonemergent nuclear medicine Lasix renogram to further  assess. 3. Nonobstructing calculus lower pole right kidney measuring 3 mm. Urinary bladder decompressed with Foley catheter. Air in the bladder is likely secondary to recent instrumentation. Prostatic calcifications noted. 4. There is aortic and iliac artery atherosclerosis. There are foci of coronary artery calcification. Aortic Atherosclerosis (ICD10-I70.0). Electronically Signed   By: Lowella Grip III M.D.   On: 04/25/2018 09:57   Ct Head Wo Contrast  Result Date: 04/25/2018 CLINICAL DATA:  Unresponsive EXAM: CT HEAD WITHOUT CONTRAST TECHNIQUE: Contiguous axial images were obtained from the base of the skull through the vertex without intravenous contrast. COMPARISON:  None. FINDINGS: Brain: No evidence of acute infarction, hemorrhage, extra-axial collection, ventriculomegaly, or mass effect. Encephalomalacia of the left inferior frontal lobe. 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 Generalized cerebral atrophy. Periventricular white matter low attenuation likely secondary to microangiopathy. Vascular: Cerebrovascular atherosclerotic calcifications are noted. Skull: Negative for fracture or focal lesion. Sinuses/Orbits: Visualized portions of the orbits are unremarkable. The mastoid sinuses are clear. Bilateral maxillary sinus mucosal thickening. Air-fluid level in the right sphenoid sinus. Other: None. IMPRESSION: 1. No acute intracranial pathology. 2. Chronic microvascular disease and cerebral atrophy. Electronically Signed   By: Kathreen Devoid   On: 04/25/2018 09:50   Dg Chest Port 1 View  Result Date: 04/26/2018 CLINICAL DATA:  Respiratory failure EXAM: PORTABLE CHEST 1 VIEW COMPARISON:  04/25/2018 FINDINGS: Endotracheal tube and NG tube remain in place, unchanged. No confluent airspace opacities or effusions. No acute bony abnormality. Heart is normal size. IMPRESSION: No significant change.  No active disease. Electronically Signed   By: Rolm Baptise M.D.   On: 04/26/2018 07:57    Dg Chest Portable 1 View  Result Date: 04/25/2018 CLINICAL DATA:  Hypoxia EXAM: PORTABLE CHEST 1 VIEW COMPARISON:  None. FINDINGS: Endotracheal tube tip is 4.5 cm above  the carina. Nasogastric tube tip and side port are in the stomach. No evident pneumothorax. There is a small left pleural effusion with left base atelectasis. Lungs elsewhere clear. Heart size and pulmonary vascularity are normal. No adenopathy. There is aortic atherosclerosis. No evident bone lesions. IMPRESSION: Tube positions as described without pneumothorax. Small pleural effusion with left base atelectasis. Lungs elsewhere clear. Heart size normal. There is aortic atherosclerosis. Aortic Atherosclerosis (ICD10-I70.0). Electronically Signed   By: Lowella Grip III M.D.   On: 04/25/2018 08:28    ASSESSMENT AND PLAN:   Principal Problem:   Severe sepsis ([place]) Active Problems:   Sepsis ([place])  *Septic shock, altered mental status due to metabolic encephalopathy secondary to sepsis Acute respiratory failure secondary to altered mental status, intubation.  IV fluids, follow lactic acid and cultures. Urinalysis and urine culture. Chest x-ray does not show any infiltrate.  Patient is a chronic smoker and have COPD. CT abdomen shows severe right-sided hydronephrosis possible obstruction and this is possibly the source of his sepsis.  Status post intubation and vent management in ICU. Empiric antibiotic broad-spectrum. Urologist and IR saw the patient, right ureteral stent was placed by urologist.  *Elevated LFTs Likely due to septic shock, continue to monitor. Patient with chronic hepatitis C.  *Microcytic anemia As per son, since patient had colon cancer and surgery he is losing blood and he requires to go for blood transfusion every few months. Transfuse currently and continue to monitor. Avoid anticoagulants.  *Acute renal failure Likely secondary to obstruction Continue to monitor with IV fluids and  decompression procedure as per urology and IR.  *COPD and chronic smoking Currently no exacerbation symptoms, may continue bronchodilators.    All the records are reviewed and case discussed with Care Management/Social Workerr. Management plans discussed with the patient, family and they are in agreement.  CODE STATUS: Full.  TOTAL TIME TAKING CARE OF THIS PATIENT: 35 minutes.     POSSIBLE D/C IN 1-2 DAYS, DEPENDING ON CLINICAL CONDITION.   Vaughan Basta M.D on 04/26/2018   Between 7am to 6pm - Pager - [phone]  After 6pm go to [email or web address] - password [email or web address] [facility]  Office  [phone]  CC: Primary care physician; [name], MD  Note: This dictation was prepared with Dragon dictation along with smaller phrase technology. Any transcriptional errors that result from this process are unintentional.

## 2018-04-26 NOTE — Progress Notes (Signed)
Pharmacy Electrolyte Monitoring Consult:  Pharmacy consulted to assist in monitoring and replacing electrolytes in this 57 y.o. male admitted on 04/25/2018 with Altered Mental Status and Respiratory Distress   Labs:  Sodium (mmol/L)  Date Value  04/26/2018 136   Potassium (mmol/L)  Date Value  04/26/2018 3.2 (L)   Magnesium (mg/dL)  Date Value  04/25/2018 3.0 (H)   Phosphorus (mg/dL)  Date Value  04/25/2018 7.1 (H)   Calcium (mg/dL)  Date Value  04/26/2018 6.7 (L)   Albumin (g/dL)  Date Value  04/26/2018 2.3 (L)    Assessment/Plan: Patient received furosemide 20 mg IV x 1 today at 0222. Potassium decreased from 4.7 to 3.2. Will give potassium 40 mEq x 1 per tube.  Will order electrolytes with morning labs.  Pharmacy to continue to follow patient and replace electrolytes per consult.  Tawnya Crook, PharmD Pharmacy Resident  04/26/2018 11:08 AM

## 2018-04-27 ENCOUNTER — Inpatient Hospital Stay: Payer: Medicaid Other

## 2018-04-27 DIAGNOSIS — E43 Unspecified severe protein-calorie malnutrition: Secondary | ICD-10-CM

## 2018-04-27 LAB — HIV ANTIBODY (ROUTINE TESTING W REFLEX): HIV SCREEN 4TH GENERATION: NONREACTIVE

## 2018-04-27 LAB — CK TOTAL AND CKMB (NOT AT ARMC)
CK TOTAL: 3618 U/L — AB (ref 49–397)
CK, MB: 38.6 ng/mL — ABNORMAL HIGH (ref 0.5–5.0)
Relative Index: 1.1 (ref 0.0–2.5)

## 2018-04-27 LAB — URINE CULTURE: Culture: NO GROWTH

## 2018-04-27 LAB — TYPE AND SCREEN
ABO/RH(D): O NEG
ANTIBODY SCREEN: NEGATIVE
Unit division: 0
Unit division: 0
Unit division: 0

## 2018-04-27 LAB — BPAM RBC
BLOOD PRODUCT EXPIRATION DATE: 201910212359
BLOOD PRODUCT EXPIRATION DATE: 201910232359
Blood Product Expiration Date: 201910212359
ISSUE DATE / TIME: 201909300421
ISSUE DATE / TIME: 201909291551
ISSUE DATE / TIME: 201909300759
UNIT TYPE AND RH: 9500
Unit Type and Rh: 9500
Unit Type and Rh: 9500

## 2018-04-27 LAB — BASIC METABOLIC PANEL
Anion gap: 9 (ref 5–15)
BUN: 81 mg/dL — AB (ref 6–20)
CHLORIDE: 105 mmol/L (ref 98–111)
CO2: 28 mmol/L (ref 22–32)
CREATININE: 2.74 mg/dL — AB (ref 0.61–1.24)
Calcium: 6.8 mg/dL — ABNORMAL LOW (ref 8.9–10.3)
GFR calc Af Amer: 28 mL/min — ABNORMAL LOW (ref >60)
GFR calc non Af Amer: 24 mL/min — ABNORMAL LOW (ref >60)
Glucose, Bld: 106 mg/dL — ABNORMAL HIGH (ref 70–99)
Potassium: 3.1 mmol/L — ABNORMAL LOW (ref 3.5–5.1)
SODIUM: 142 mmol/L (ref 135–145)

## 2018-04-27 LAB — GLUCOSE, CAPILLARY
GLUCOSE-CAPILLARY: 112 mg/dL — AB (ref 70–99)
GLUCOSE-CAPILLARY: 121 mg/dL — AB (ref 70–99)
Glucose-Capillary: 100 mg/dL — ABNORMAL HIGH (ref 70–99)
Glucose-Capillary: 101 mg/dL — ABNORMAL HIGH (ref 70–99)
Glucose-Capillary: 120 mg/dL — ABNORMAL HIGH (ref 70–99)
Glucose-Capillary: 87 mg/dL (ref 70–99)
Glucose-Capillary: 88 mg/dL (ref 70–99)

## 2018-04-27 LAB — PHOSPHORUS: Phosphorus: 3.5 mg/dL (ref 2.5–4.6)

## 2018-04-27 LAB — TRIGLYCERIDES: TRIGLYCERIDES: 113 mg/dL (ref <150)

## 2018-04-27 LAB — MAGNESIUM: MAGNESIUM: 2.9 mg/dL — AB (ref 1.7–2.4)

## 2018-04-27 MED ORDER — FENTANYL CITRATE (PF) 100 MCG/2ML IJ SOLN
100.0000 ug | INTRAMUSCULAR | Status: DC | PRN
  Administered 2018-04-28: 100 ug via INTRAVENOUS
  Filled 2018-04-27: qty 2

## 2018-04-27 MED ORDER — PROPOFOL 1000 MG/100ML IV EMUL
INTRAVENOUS | Status: AC
  Filled 2018-04-27: qty 100

## 2018-04-27 MED ORDER — PANTOPRAZOLE SODIUM 40 MG PO PACK
40.0000 mg | PACK | Freq: Every day | ORAL | Status: DC
  Administered 2018-04-28: 40 mg

## 2018-04-27 MED ORDER — PROPOFOL 1000 MG/100ML IV EMUL
0.0000 ug/kg/min | INTRAVENOUS | Status: DC
  Administered 2018-04-27: 10 ug/kg/min via INTRAVENOUS
  Administered 2018-04-28: 20 ug/kg/min via INTRAVENOUS
  Filled 2018-04-27: qty 100

## 2018-04-27 MED ORDER — PANTOPRAZOLE SODIUM 40 MG IV SOLR
40.0000 mg | Freq: Two times a day (BID) | INTRAVENOUS | Status: DC
  Administered 2018-04-27: 40 mg via INTRAVENOUS
  Filled 2018-04-27: qty 40

## 2018-04-27 MED ORDER — LACTATED RINGERS IV SOLN
INTRAVENOUS | Status: DC
  Administered 2018-04-27 (×2): 100 mL/h via INTRAVENOUS
  Administered 2018-04-28 (×2): via INTRAVENOUS

## 2018-04-27 MED ORDER — POTASSIUM CHLORIDE 20 MEQ PO PACK
40.0000 meq | PACK | ORAL | Status: AC
  Administered 2018-04-27 (×2): 40 meq
  Filled 2018-04-27 (×2): qty 2

## 2018-04-27 MED ORDER — FENTANYL CITRATE (PF) 100 MCG/2ML IJ SOLN
100.0000 ug | INTRAMUSCULAR | Status: DC | PRN
  Administered 2018-04-27 – 2018-04-28 (×3): 100 ug via INTRAVENOUS
  Filled 2018-04-27 (×3): qty 2

## 2018-04-27 NOTE — Progress Notes (Addendum)
Pharmacy Antibiotic Note  Chad [name] is a 58 y.o. male admitted on 04/25/2018 with sepsis secondary to pyelonephritis. S/p cystoscopy with right ureteral stent placement and replacement of Foley catheter. Patient with thick motor oil like appearance to the urine and elevated CK. Vancomycin discontinued 9/30 as concern is for urinary source. Pharmacy has been consulted for cefepime dosing.  Plan: Renal function improved. Less concern for sepsis at this time. If pathogen were present, high suspicion for urinary source. Will continue cefepime 2 g IV q24h for treatment of UTI.  Height: 6' (182.9 cm) Weight: 167 lb 5.3 oz (75.9 kg) IBW/kg (Calculated) : 77.6  Temp (24hrs), Avg:98.4 F (36.9 C), Min:97.8 F (36.6 C), Max:99.1 F (37.3 C)  Recent Labs  Lab 04/25/18 0751 04/25/18 1214 04/25/18 2031 04/26/18 0118 04/26/18 0540 04/26/18 1041 04/27/18 0417  WBC 17.1*  --  18.2* 11.8*  --  7.9  --   CREATININE 3.42*  --  3.23*  --  3.04*  --  2.74*  LATICACIDVEN  --  1.4  --   --   --   --   --     Estimated Creatinine Clearance: 31.2 mL/min (A) (by C-G formula based on SCr of 2.74 mg/dL (H)).    Allergies  Allergen Reactions  . Penicillins Rash    Antimicrobials this admission: Vancomycin 9/29 >> 9/30 Cefepime 9/29 >>  Dose adjustments this admission: N/A  Microbiology results: 9/29 BCx: NG 2 days 9/29 UCx: no growth 9/29 UCx: no growth 9/29 MRSA PCR: negative  Thank you for allowing pharmacy to be a part of this patient's care.  Tawnya Crook, PharmD Pharmacy Resident  04/27/2018 11:50 AM

## 2018-04-27 NOTE — Progress Notes (Signed)
Pharmacy Electrolyte Monitoring Consult:  Pharmacy consulted to assist in monitoring and replacing electrolytes in this 57 y.o. male admitted on 04/25/2018 with Altered Mental Status and Respiratory Distress   Labs:  Sodium (mmol/L)  Date Value  04/27/2018 142   Potassium (mmol/L)  Date Value  04/27/2018 3.1 (L)   Magnesium (mg/dL)  Date Value  04/27/2018 2.9 (H)   Phosphorus (mg/dL)  Date Value  04/27/2018 3.5   Calcium (mg/dL)  Date Value  04/27/2018 6.8 (L)   Albumin (g/dL)  Date Value  04/26/2018 2.3 (L)    Assessment/Plan: Will give potassium 40 mEq per tube x 2 doses.  Will order electrolytes with morning labs.  Pharmacy to continue to follow patient and replace electrolytes per consult.  Tawnya Crook, PharmD Pharmacy Resident  04/27/2018 11:48 AM

## 2018-04-27 NOTE — Progress Notes (Signed)
Insurance Benefits Check:  Number called: [phone] ([facility] Provider Line) Rep: Rollene Fare Reference Number: [id number]  Confirmed patient has a active [employer] [place] Medicaid plan effective 06/27/17 to current.  Per rep, there are no out-of-network benefits on plan.  Services and facilities in [place], including [facility], are considered out-of-network.  Any services would require auth to see if they can be approved.  Phone number to contact for auth is [phone].

## 2018-04-27 NOTE — Progress Notes (Signed)
Report given to Leory Plowman, RN who is now taking over patient's care.  Fentanyl off since 1200 for wake up assessment.  Patient arouses to voice but does not follow commands.

## 2018-04-27 NOTE — Progress Notes (Signed)
[place] at [place] NAME: Chad [name]    MR#:  [id number]  DATE OF BIRTH:  [birth year]  SUBJECTIVE:  CHIEF COMPLAINT:   Chief Complaint  Patient presents with  . Altered Mental Status  . Respiratory Distress   Brought with altered mental status and found to have sepsis, right-sided severe hydronephrosis and respiratory failure.  Intubated and currently on ventilatory support.  Procedures Intubation- 04/25/18 Right-sided ureteral stent 04/25/18 REVIEW OF SYSTEMS:   Patient is currently intubated and on ventilatory support so cannot give review of system.  ROS  DRUG ALLERGIES:   Allergies  Allergen Reactions  . Penicillins Rash    VITALS:  Blood pressure (!) 142/81, pulse (!) 101, temperature 98.2 F (36.8 C), temperature source Bladder, resp. rate 18, height 6' (1.829 m), weight 75.9 kg, SpO2 100 %.  PHYSICAL EXAMINATION:   GENERAL:  57 y.o.-year-old patient lying in the bed critical appearing. EYES: Pupils equal, round, reactive to light . No scleral icterus.  HEENT: Head atraumatic, normocephalic. Oropharynx and nasopharynx clear.  NECK:  Supple, no jugular venous distention. No thyroid enlargement, no tenderness.  LUNGS: Normal breath sounds bilaterally, no wheezing, rales,rhonchi or crepitation. No use of accessory muscles of respiration.  ET tube in place and on vent management. CARDIOVASCULAR: S1, S2 normal. No murmurs, rubs, or gallops.  ABDOMEN: Soft, nontender, nondistended. Bowel sounds present. No organomegaly or mass.  Surgical scar present. EXTREMITIES: No pedal edema, cyanosis, or clubbing.  NEUROLOGIC: Sedated on ventilatory support. PSYCHIATRIC: The patient is sedated.  SKIN: No obvious rash, lesion, or ulcer.   Physical Exam LABORATORY PANEL:   CBC Recent Labs  Lab 04/26/18 1041  WBC 7.9  HGB 7.6*  HCT 22.9*  PLT 200    ------------------------------------------------------------------------------------------------------------------  Chemistries  Recent Labs  Lab 04/26/18 0540 04/27/18 0417  NA 136 142  K 3.2* 3.1*  CL 108 105  CO2 20* 28  GLUCOSE 148* 106*  BUN 92* 81*  CREATININE 3.04* 2.74*  CALCIUM 6.7* 6.8*  MG  --  2.9*  AST 509*  --   ALT 157*  --   ALKPHOS 69  --   BILITOT 0.8  --    ------------------------------------------------------------------------------------------------------------------  Cardiac Enzymes Recent Labs  Lab 04/25/18 0751  TROPONINI 0.13*   ------------------------------------------------------------------------------------------------------------------  RADIOLOGY:  Dg Chest Port 1 View  Result Date: 04/26/2018 CLINICAL DATA:  Respiratory failure EXAM: PORTABLE CHEST 1 VIEW COMPARISON:  04/25/2018 FINDINGS: Endotracheal tube and NG tube remain in place, unchanged. No confluent airspace opacities or effusions. No acute bony abnormality. Heart is normal size. IMPRESSION: No significant change.  No active disease. Electronically Signed   By: Rolm Baptise M.D.   On: 04/26/2018 07:57    ASSESSMENT AND PLAN:   Principal Problem:   Severe sepsis ([facility]) Active Problems:   Sepsis ([place])   Protein-calorie malnutrition, severe  *Septic shock, altered mental status due to metabolic encephalopathy secondary to sepsis Acute respiratory failure secondary to altered mental status, intubation.  IV fluids, follow lactic acid and cultures. Urinalysis and urine culture. Chest x-ray does not show any infiltrate.  Patient is a chronic smoker and have COPD. CT abdomen shows severe right-sided hydronephrosis possible obstruction and this is possibly the source of his sepsis.  Status post intubation and vent management in ICU. Empiric antibiotic broad-spectrum. Urologist and IR saw the patient, right ureteral stent was placed by urologist.  *Elevated LFTs Likely due  to septic shock, continue to monitor. Patient with  chronic hepatitis C.  *Microcytic anemia As per son, since patient had colon cancer and surgery he is losing blood and he requires to go for blood transfusion every few months. Transfuse currently and continue to monitor. Avoid anticoagulants.  *Acute renal failure Likely secondary to obstruction Continue to monitor with IV fluids and decompression procedure as per urology and IR. Monitor renal function, improving.  *COPD and chronic smoking Currently no exacerbation symptoms, may continue bronchodilators.   All the records are reviewed and case discussed with Care Management/Social Workerr. Management plans discussed with the patient, family and they are in agreement.  CODE STATUS: Full.  TOTAL TIME TAKING CARE OF THIS PATIENT: 35 minutes.    POSSIBLE D/C IN 1-2 DAYS, DEPENDING ON CLINICAL CONDITION.   Vaughan Basta M.D on 04/27/2018   Between 7am to 6pm - Pager - [phone]  After 6pm go to [email or web address] - password [email or web address] [facility]  Office  [phone]  CC: Primary care physician; [name], MD  Note: This dictation was prepared with Dragon dictation along with smaller phrase technology. Any transcriptional errors that result from this process are unintentional.

## 2018-04-27 NOTE — Progress Notes (Signed)
CRITICAL CARE NOTE  CC  follow up respiratory failure  SUBJECTIVE Patient remains critically ill Prognosis is guarded     SIGNIFICANT EVENTS 9/29 Cystoscopy with Right ureteral stent placement 9/30 remains on vasopressors, on vent support, resp failure 10/1 remains on vent resp failure    BP 139/77   Pulse 91   Temp 98.2 F (36.8 C) (Bladder)   Resp 16   Ht 6' (1.829 m)   Wt 75.9 kg   SpO2 100%   BMI 22.69 kg/m    REVIEW OF SYSTEMS  PATIENT IS UNABLE TO PROVIDE COMPLETE REVIEW OF SYSTEMS DUE TO SEVERE CRITICAL ILLNESS   PHYSICAL EXAMINATION:  GENERAL:critically ill appearing, +resp distress HEAD: Normocephalic, atraumatic.  EYES: Pupils equal, round, reactive to light.  No scleral icterus.  MOUTH: Moist mucosal membrane. NECK: Supple. No thyromegaly. No nodules. No JVD.  PULMONARY: +rhonchi, +wheezing CARDIOVASCULAR: S1 and S2. Regular rate and rhythm. No murmurs, rubs, or gallops.  GASTROINTESTINAL: Soft, nontender, -distended. No masses. Positive bowel sounds. No hepatosplenomegaly.  MUSCULOSKELETAL: No swelling, clubbing, or edema.  NEUROLOGIC: obtunded, GCS<8 SKIN:intact,warm,dry      Indwelling Urinary Catheter continued, requirement due to   Reason to continue Indwelling Urinary Catheter for strict Intake/Output monitoring for hemodynamic instability         Ventilator continued, requirement due to, resp failure    Ventilator Sedation RASS 0 to -2     ASSESSMENT AND PLAN SYNOPSIS 57 yo with severe septic shock from acute RT hydronephrosis from acute pyelonephritis with severe resp failure and renal failure with rhabdomyolysis   Severe Hypoxic and Hypercapnic Respiratory Failure -continue Full MV support -continue Bronchodilator Therapy -Wean Fio2 and PEEP as tolerated -will perform SAT/SBT when respiratory parameters are met  Renal Failure-most likely due to ATN -follow chem 7 -follow UO -continue Foley Catheter-assess need  daily Follow up Urology consultation   NEUROLOGY - intubated and sedated - minimal sedation to achieve a RASS goal: -1 Wake up assessment pending   Septic shock -use vasopressors to keep MAP>65 -follow ABG and LA -follow up cultures -emperic ABX -consider stress dose steroids -aggressive IV fluid resuscitation  CARDIAC ICU monitoring  ID/pyelonephritis -continue IV abx as prescibed -follow up cultures  GI/Nutrition GI PROPHYLAXIS as indicated DIET-->TF's as tolerated Constipation protocol as indicated  ENDO - ICU hypoglycemic\Hyperglycemia protocol -check FSBS per protocol   ELECTROLYTES -follow labs as needed -replace as needed -pharmacy consultation and following   DVT/GI PRX ordered TRANSFUSIONS AS NEEDED MONITOR FSBS ASSESS the need for LABS as needed   Critical Care Time devoted to patient care services described in this note is 36 minutes.   Overall, patient is critically ill, prognosis is guarded.  Patient with Multiorgan failure and at high risk for cardiac arrest and death.    Corrin Parker, M.D.  [facility]  Medical Director [place] Director [facility] Cardio-Pulmonary Department

## 2018-04-28 LAB — CBC
HCT: 26.4 % — ABNORMAL LOW (ref 40.0–52.0)
Hemoglobin: 8.6 g/dL — ABNORMAL LOW (ref 13.0–18.0)
MCH: 21.1 pg — ABNORMAL LOW (ref 26.0–34.0)
MCHC: 32.5 g/dL (ref 32.0–36.0)
MCV: 64.9 fL — AB (ref 80.0–100.0)
PLATELETS: 166 10*3/uL (ref 150–440)
RBC: 4.06 MIL/uL — AB (ref 4.40–5.90)
RDW: 36.8 % — AB (ref 11.5–14.5)
WBC: 10.2 10*3/uL (ref 3.8–10.6)

## 2018-04-28 LAB — BLOOD GAS, ARTERIAL
Acid-Base Excess: 6.8 mmol/L — ABNORMAL HIGH (ref 0.0–2.0)
Acid-Base Excess: 6.4 mmol/L — ABNORMAL HIGH (ref 0.0–2.0)
BICARBONATE: 31.9 mmol/L — AB (ref 20.0–28.0)
Bicarbonate: 31.2 mmol/L — ABNORMAL HIGH (ref 20.0–28.0)
FIO2: 0.28
FIO2: 0.28
MECHVT: 500 mL
O2 Saturation: 90.1 %
O2 Saturation: 93.7 %
PATIENT TEMPERATURE: 37
PCO2 ART: 48 mmHg (ref 32.0–48.0)
PEEP/CPAP: 5 cmH2O
PEEP/CPAP: 5 cmH2O
PH ART: 7.44 (ref 7.350–7.450)
PO2 ART: 57 mmHg — AB (ref 83.0–108.0)
PO2 ART: 67 mmHg — AB (ref 83.0–108.0)
Patient temperature: 37
Pressure support: 5 cmH2O
RATE: 16 resp/min
pCO2 arterial: 46 mmHg (ref 32.0–48.0)
pH, Arterial: 7.43 (ref 7.350–7.450)

## 2018-04-28 LAB — BASIC METABOLIC PANEL
Anion gap: 7 (ref 5–15)
BUN: 67 mg/dL — AB (ref 6–20)
CHLORIDE: 109 mmol/L (ref 98–111)
CO2: 29 mmol/L (ref 22–32)
CREATININE: 1.78 mg/dL — AB (ref 0.61–1.24)
Calcium: 7.3 mg/dL — ABNORMAL LOW (ref 8.9–10.3)
GFR calc Af Amer: 46 mL/min — ABNORMAL LOW (ref >60)
GFR calc non Af Amer: 40 mL/min — ABNORMAL LOW (ref >60)
Glucose, Bld: 122 mg/dL — ABNORMAL HIGH (ref 70–99)
Potassium: 3.9 mmol/L (ref 3.5–5.1)
Sodium: 145 mmol/L (ref 135–145)

## 2018-04-28 LAB — GLUCOSE, CAPILLARY
GLUCOSE-CAPILLARY: 128 mg/dL — AB (ref 70–99)
GLUCOSE-CAPILLARY: 125 mg/dL — AB (ref 70–99)
Glucose-Capillary: 122 mg/dL — ABNORMAL HIGH (ref 70–99)
Glucose-Capillary: 123 mg/dL — ABNORMAL HIGH (ref 70–99)
Glucose-Capillary: 128 mg/dL — ABNORMAL HIGH (ref 70–99)
Glucose-Capillary: 130 mg/dL — ABNORMAL HIGH (ref 70–99)

## 2018-04-28 LAB — CYTOLOGY - NON PAP

## 2018-04-28 LAB — TRIGLYCERIDES: Triglycerides: 56 mg/dL (ref <150)

## 2018-04-28 MED ORDER — DEXMEDETOMIDINE HCL IN NACL 400 MCG/100ML IV SOLN
0.4000 ug/kg/h | INTRAVENOUS | Status: DC
  Administered 2018-04-28 (×2): 1 ug/kg/h via INTRAVENOUS
  Administered 2018-04-28: 0.6 ug/kg/h via INTRAVENOUS
  Administered 2018-04-29 (×2): 1.2 ug/kg/h via INTRAVENOUS
  Administered 2018-04-29: 1 ug/kg/h via INTRAVENOUS
  Administered 2018-04-29: 0.8 ug/kg/h via INTRAVENOUS
  Administered 2018-04-29: 1.2 ug/kg/h via INTRAVENOUS
  Administered 2018-04-30: 1 ug/kg/h via INTRAVENOUS
  Administered 2018-04-30: 0.4 ug/kg/h via INTRAVENOUS
  Administered 2018-05-01 – 2018-05-02 (×3): 0.8 ug/kg/h via INTRAVENOUS
  Administered 2018-05-02: 0.6 ug/kg/h via INTRAVENOUS
  Filled 2018-04-28 (×14): qty 100

## 2018-04-28 MED ORDER — MIDAZOLAM HCL 2 MG/2ML IJ SOLN
INTRAMUSCULAR | Status: AC
  Filled 2018-04-28: qty 2

## 2018-04-28 MED ORDER — GABAPENTIN 250 MG/5ML PO SOLN
100.0000 mg | Freq: Three times a day (TID) | ORAL | Status: DC
  Administered 2018-04-28: 100 mg
  Filled 2018-04-28 (×22): qty 2

## 2018-04-28 MED ORDER — ASPIRIN 300 MG RE SUPP
300.0000 mg | Freq: Once | RECTAL | Status: AC
  Administered 2018-04-28: 300 mg via RECTAL
  Filled 2018-04-28: qty 1

## 2018-04-28 MED ORDER — QUETIAPINE FUMARATE 25 MG PO TABS: 50.0000 mg | ORAL_TABLET | Freq: Every day | ORAL | Status: DC

## 2018-04-28 MED ORDER — ESCITALOPRAM OXALATE 10 MG PO TABS
20.0000 mg | ORAL_TABLET | Freq: Every day | ORAL | Status: DC
  Administered 2018-04-28: 20 mg
  Filled 2018-04-28 (×5): qty 2

## 2018-04-28 MED ORDER — MIDAZOLAM HCL 2 MG/2ML IJ SOLN
2.0000 mg | INTRAMUSCULAR | Status: DC | PRN
  Administered 2018-04-28: 2 mg via INTRAVENOUS
  Filled 2018-04-28: qty 2

## 2018-04-28 MED ORDER — MIDAZOLAM HCL 2 MG/2ML IJ SOLN: 2.0000 mg | INTRAMUSCULAR | Status: DC | PRN

## 2018-04-28 NOTE — Progress Notes (Signed)
[place] [facility] [place] NAME: Chad [name]    MR#:  [id number]  DATE OF BIRTH:  1961/02/02  SUBJECTIVE:  CHIEF COMPLAINT:   Chief Complaint  Patient presents with  . Altered Mental Status  . Respiratory Distress   Brought with altered mental status and found to have sepsis, right-sided severe hydronephrosis and respiratory failure.  Intubated and currently on ventilatory support.  Procedures Intubation- 04/25/18 Right-sided ureteral stent 04/25/18  REVIEW OF SYSTEMS:   Patient is currently intubated and on ventilatory support so cannot give review of system.  ROS  DRUG ALLERGIES:   Allergies  Allergen Reactions  . Penicillins Rash    VITALS:  Blood pressure 130/76, pulse 93, temperature (!) 100.4 F (38 C), temperature source Bladder, resp. rate 15, height 6' (1.829 m), weight 75.1 kg, SpO2 98 %.  PHYSICAL EXAMINATION:   GENERAL:  57 y.o.-year-old patient lying in the bed critical appearing. EYES: Pupils equal, round, reactive to light . No scleral icterus.  HEENT: Head atraumatic, normocephalic. Oropharynx and nasopharynx clear.  NECK:  Supple, no jugular venous distention. No thyroid enlargement, no tenderness.  LUNGS: Normal breath sounds bilaterally, no wheezing, rales,rhonchi or crepitation. No use of accessory muscles of respiration.  ET tube in place and on vent management. CARDIOVASCULAR: S1, S2 normal. No murmurs, rubs, or gallops.  ABDOMEN: Soft, nontender, nondistended. Bowel sounds present. No organomegaly or mass.  Surgical scar present. EXTREMITIES: No pedal edema, cyanosis, or clubbing.  NEUROLOGIC: Sedated on ventilatory support. PSYCHIATRIC: The patient is sedated.  SKIN: No obvious rash, lesion, or ulcer.   Physical Exam LABORATORY PANEL:   CBC Recent Labs  Lab 04/28/18 0430  WBC 10.2  HGB 8.6*  HCT 26.4*  PLT 166    ------------------------------------------------------------------------------------------------------------------  Chemistries  Recent Labs  Lab 04/26/18 0540 04/27/18 0417 04/28/18 0430  NA 136 142 145  K 3.2* 3.1* 3.9  CL 108 105 109  CO2 20* 28 29  GLUCOSE 148* 106* 122*  BUN 92* 81* 67*  CREATININE 3.04* 2.74* 1.78*  CALCIUM 6.7* 6.8* 7.3*  MG  --  2.9*  --   AST 509*  --   --   ALT 157*  --   --   ALKPHOS 69  --   --   BILITOT 0.8  --   --    ------------------------------------------------------------------------------------------------------------------  Cardiac Enzymes Recent Labs  Lab 04/25/18 0751  TROPONINI 0.13*   ------------------------------------------------------------------------------------------------------------------  RADIOLOGY:  US Renal  Result Date: 04/27/2018 CLINICAL DATA:  History of right hydronephrosis, follow-up EXAM: RENAL / URINARY TRACT ULTRASOUND COMPLETE COMPARISON:  CT abdomen pelvis of 04/25/2017 FINDINGS: Right Kidney: Length: 10.8 cm. Only slight fullness of the renal calices is seen. The right renal pelvis is not significantly dilated. This represents and improvement when compared to the CT images of 04/25/2017. The renal parenchyma is somewhat echogenic which may indicate chronic renal medical disease. Correlate with renal laboratory values. Left Kidney: Length: 12.8 cm.  No hydronephrosis is seen. Bladder: The urinary bladder is decompressed with Foley catheter in place. IMPRESSION: 1. Slight fullness of the right renal calices is noted which represents and improvement compared to the CT images recently. 2. No left hydronephrosis is seen. 3. Somewhat echogenic right renal parenchyma. Correlate with renal laboratory values. Electronically Signed   By: Ivar Drape M.D.   On: 04/27/2018 17:04    ASSESSMENT AND PLAN:   Principal Problem:   Severe sepsis ([place]) Active Problems:   Sepsis ([place])  Protein-calorie malnutrition,  severe  *Septic shock, altered mental status due to metabolic encephalopathy secondary to sepsis Acute respiratory failure secondary to altered mental status, intubation.  IV fluids, follow lactic acid and cultures. Urinalysis and urine culture. Chest x-ray does not show any infiltrate.  Patient is a chronic smoker and have COPD. CT abdomen shows severe right-sided hydronephrosis possible obstruction and this is possibly the source of his sepsis.  Status post intubation and vent management in ICU. Empiric antibiotic broad-spectrum. Urologist and IR saw the patient, right ureteral stent was placed by urologist.  *Elevated LFTs Likely due to septic shock, continue to monitor. Patient with chronic hepatitis C.   Coming down.  *Microcytic anemia As per son, since patient had colon cancer and surgery he is losing blood and he requires to go for blood transfusion every few months. Transfuse currently and continue to monitor. Avoid anticoagulants.  Stable now.  *Acute renal failure Likely secondary to obstruction Continue to monitor with IV fluids and decompression procedure as per urology and IR. Monitor renal function, improving.  * Rhabdomyolysis    Pt was on floor for extended time- CK was high- came down/  *COPD and chronic smoking Currently no exacerbation symptoms, may continue bronchodilators.   All the records are reviewed and case discussed with Care Management/Social Workerr. Management plans discussed with the patient, family and they are in agreement.  CODE STATUS: Full.  TOTAL TIME TAKING CARE OF THIS PATIENT: 35 minutes.    POSSIBLE D/C IN 1-2 DAYS, DEPENDING ON CLINICAL CONDITION.   Vaughan Basta M.D on 04/28/2018   Between 7am to 6pm - Pager - [phone]  After 6pm go to [email or web address] - password [email or web address] [facility]  Office  [phone]  CC: Primary care physician; [name], MD  Note: This dictation was  prepared with Dragon dictation along with smaller phrase technology. Any transcriptional errors that result from this process are unintentional.

## 2018-04-28 NOTE — Progress Notes (Signed)
CRITICAL CARE NOTE  CC  severe respiratory failure  SUBJECTIVE Patient remains critically ill Prognosis is guarded On full vent support      SIGNIFICANT EVENTS 9/29 Cystoscopy with Right ureteral stent placement 9/30 remains on vasopressors, on vent support, resp failure 10/1 remains on vent resp failure, failed vent weaning trials   BP 122/67   Pulse 93   Temp 99.6 F (37.6 C) (Axillary)   Resp 18   Ht 6' (1.829 m)   Wt 75.1 kg   SpO2 100%   BMI 22.45 kg/m    REVIEW OF SYSTEMS  PATIENT IS UNABLE TO PROVIDE COMPLETE REVIEW OF SYSTEMS DUE TO SEVERE CRITICAL ILLNESS   PHYSICAL EXAMINATION:  GENERAL:critically ill appearing, +resp distress HEAD: Normocephalic, atraumatic.  EYES: Pupils equal, round, reactive to light.  No scleral icterus.  MOUTH: Moist mucosal membrane. NECK: Supple. No thyromegaly. No nodules. No JVD.  PULMONARY: +rhonchi, +wheezing CARDIOVASCULAR: S1 and S2. Regular rate and rhythm. No murmurs, rubs, or gallops.  GASTROINTESTINAL: Soft, nontender, -distended. No masses. Positive bowel sounds. No hepatosplenomegaly.  MUSCULOSKELETAL: No swelling, clubbing, or edema.  NEUROLOGIC: obtunded, GCS<8 SKIN:intact,warm,dry      Indwelling Urinary Catheter continued, requirement due to   Reason to continue Indwelling Urinary Catheter for strict Intake/Output monitoring for hemodynamic instability         Ventilator continued, requirement due to, resp failure    Ventilator Sedation RASS 0 to -2     ASSESSMENT AND PLAN SYNOPSIS 57 yo with severe septic shock from acute RT hydronephrosis from acute pyelonephritis with severe resp failure and renal failure with rhabdomyolysis, failure to wean from vent due to resp muscle fatigue and agitation  Severe Hypoxic and Hypercapnic Respiratory Failure -continue Full MV support -continue Bronchodilator Therapy -Wean Fio2 and PEEP as tolerated -will perform SAT/SBT when respiratory parameters are  met   Renal Failure-most likely due to ATN -follow chem 7 -follow UO -continue Foley Catheter-assess need daily   NEUROLOGY - intubated and sedated - minimal sedation to achieve a RASS goal: -1 Wake up assessment pending   Septic shock -use vasopressors to keep MAP>65 -aggressive IV fluid resuscitation  CARDIAC ICU monitoring  ID -continue IV abx as prescibed -follow up cultures  GI/Nutrition GI PROPHYLAXIS as indicated DIET-->TF's as tolerated Constipation protocol as indicated  ENDO - ICU hypoglycemic\Hyperglycemia protocol -check FSBS per protocol   ELECTROLYTES -follow labs as needed -replace as needed -pharmacy consultation and following   DVT/GI PRX ordered TRANSFUSIONS AS NEEDED MONITOR FSBS ASSESS the need for LABS as needed   Critical Care Time devoted to patient care services described in this note is 34mnutes.   Overall, patient is critically ill, prognosis is guarded.  Patient with Multiorgan failure and at high risk for cardiac arrest and death.    KCorrin Parker M.D.  [facility]  Medical Director ITremontDirector [facility]

## 2018-04-28 NOTE — Progress Notes (Signed)
Pharmacy Antibiotic Note  Chad [name] is a 57 y.o. male admitted on 04/25/2018 with sepsis secondary to pyelonephritis. S/p cystoscopy with right ureteral stent placement and replacement of Foley catheter. Patient with thick motor oil like appearance to the urine and elevated CK. Vancomycin discontinued 9/30 as concern is for urinary source. Pharmacy has been consulted for cefepime dosing.  Plan: Per CCM on ICU rounds 10/2 am, will continue antibiotics for a total of 8 days.  Continue cefepime 2 g IV q24h  Height: 6' (182.9 cm) Weight: 165 lb 9.1 oz (75.1 kg) IBW/kg (Calculated) : 77.6  Temp (24hrs), Avg:99 F (37.2 C), Min:98.3 F (36.8 C), Max:99.6 F (37.6 C)  Recent Labs  Lab 04/25/18 0751 04/25/18 1214 04/25/18 2031 04/26/18 0118 04/26/18 0540 04/26/18 1041 04/27/18 0417 04/28/18 0430  WBC 17.1*  --  18.2* 11.8*  --  7.9  --  10.2  CREATININE 3.42*  --  3.23*  --  3.04*  --  2.74* 1.78*  LATICACIDVEN  --  1.4  --   --   --   --   --   --     Estimated Creatinine Clearance: 47.5 mL/min (A) (by C-G formula based on SCr of 1.78 mg/dL (H)).    Allergies  Allergen Reactions  . Penicillins Rash    Antimicrobials this admission: Vancomycin 9/29 >> 9/30 Cefepime 9/29 >>  Dose adjustments this admission: N/A  Microbiology results: 9/29 BCx: NG 3 days 9/29 UCx: no growth 9/29 UCx: no growth 9/29 MRSA PCR: negative  Thank you for allowing pharmacy to be a part of this patient's care.  Ocie Doyne, PharmD Candidate 04/28/2018 1:01 PM

## 2018-04-28 NOTE — Progress Notes (Signed)
Urology consult follow-up  Patient extubated today.  Urine remains relatively clear with Foley catheter in place.  Creatinine trending down.  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    No family at bedside and the patient is not oriented or following commands.  I will stop back by tomorrow to try to discuss this with the patient and/or his family members.  Once he has clinically improved, we will arrange for follow-up to discuss definitive management of this new diagnosis.  Hollice Espy, MD

## 2018-04-28 NOTE — Progress Notes (Signed)
Pt was suctioned prior to extubation for a small amount of frothy tan secretions. Per Dr. Zoila Shutter order, he was extubated without incident. His Sa02 is 99% on room air.

## 2018-04-28 NOTE — Progress Notes (Addendum)
Pharmacy Electrolyte Monitoring Consult:  Pharmacy consulted to assist in monitoring and replacing electrolytes in this 57 y.o. male admitted on 04/25/2018 with Altered Mental Status and Respiratory Distress   Labs:  Sodium (mmol/L)  Date Value  04/28/2018 145   Potassium (mmol/L)  Date Value  04/28/2018 3.9   Magnesium (mg/dL)  Date Value  04/27/2018 2.9 (H)   Phosphorus (mg/dL)  Date Value  04/27/2018 3.5   Calcium (mg/dL)  Date Value  04/28/2018 7.3 (L)   Albumin (g/dL)  Date Value  04/26/2018 2.3 (L)    Assessment/Plan: No supplementation required. Will order BMP with morning labs.  Pharmacy to continue to follow patient and replace electrolytes per consult.  Ocie Doyne, PharmD Candidate 04/28/2018 1:06 PM

## 2018-04-29 ENCOUNTER — Inpatient Hospital Stay: Payer: Medicaid Other

## 2018-04-29 LAB — BASIC METABOLIC PANEL
Anion gap: 8 (ref 5–15)
BUN: 52 mg/dL — ABNORMAL HIGH (ref 6–20)
CALCIUM: 7.7 mg/dL — AB (ref 8.9–10.3)
CHLORIDE: 114 mmol/L — AB (ref 98–111)
CO2: 29 mmol/L (ref 22–32)
Creatinine, Ser: 1.38 mg/dL — ABNORMAL HIGH (ref 0.61–1.24)
GFR calc non Af Amer: 54 mL/min — ABNORMAL LOW (ref >60)
Glucose, Bld: 112 mg/dL — ABNORMAL HIGH (ref 70–99)
Potassium: 3.9 mmol/L (ref 3.5–5.1)
Sodium: 151 mmol/L — ABNORMAL HIGH (ref 135–145)

## 2018-04-29 LAB — GLUCOSE, CAPILLARY
GLUCOSE-CAPILLARY: 94 mg/dL (ref 70–99)
GLUCOSE-CAPILLARY: 113 mg/dL — AB (ref 70–99)
GLUCOSE-CAPILLARY: 130 mg/dL — AB (ref 70–99)
Glucose-Capillary: 113 mg/dL — ABNORMAL HIGH (ref 70–99)
Glucose-Capillary: 108 mg/dL — ABNORMAL HIGH (ref 70–99)

## 2018-04-29 LAB — CBC
HCT: 27.3 % — ABNORMAL LOW (ref 40.0–52.0)
Hemoglobin: 9 g/dL — ABNORMAL LOW (ref 13.0–18.0)
MCH: 21.3 pg — AB (ref 26.0–34.0)
MCHC: 32.9 g/dL (ref 32.0–36.0)
MCV: 64.8 fL — AB (ref 80.0–100.0)
Platelets: 166 10*3/uL (ref 150–440)
RBC: 4.21 MIL/uL — AB (ref 4.40–5.90)
RDW: 37.1 % — ABNORMAL HIGH (ref 11.5–14.5)
WBC: 9.7 10*3/uL (ref 3.8–10.6)

## 2018-04-29 MED ORDER — THIAMINE HCL 100 MG/ML IJ SOLN
100.0000 mg | Freq: Every day | INTRAMUSCULAR | Status: DC
  Administered 2018-04-29 – 2018-05-04 (×5): 100 mg via INTRAVENOUS
  Filled 2018-04-29 (×6): qty 2

## 2018-04-29 MED ORDER — FOLIC ACID 5 MG/ML IJ SOLN
1.0000 mg | Freq: Every day | INTRAMUSCULAR | Status: DC
  Administered 2018-04-29 – 2018-05-03 (×5): 1 mg via INTRAVENOUS
  Filled 2018-04-29 (×7): qty 0.2

## 2018-04-29 MED ORDER — DEXTROSE 5 % IV SOLN
INTRAVENOUS | Status: DC
  Administered 2018-04-29 (×2): via INTRAVENOUS
  Administered 2018-04-30 – 2018-05-02 (×3): 50 mL/h via INTRAVENOUS
  Administered 2018-05-03: 05:00:00 via INTRAVENOUS

## 2018-04-29 MED ORDER — LORAZEPAM 2 MG/ML IJ SOLN
0.5000 mg | INTRAMUSCULAR | Status: DC | PRN
  Administered 2018-04-29 – 2018-05-02 (×9): 1 mg via INTRAVENOUS
  Filled 2018-04-29 (×10): qty 1

## 2018-04-29 MED ORDER — MIDAZOLAM HCL 2 MG/2ML IJ SOLN
2.0000 mg | Freq: Once | INTRAMUSCULAR | Status: AC
  Administered 2018-04-29: 2 mg via INTRAVENOUS
  Filled 2018-04-29: qty 2

## 2018-04-29 NOTE — Progress Notes (Signed)
[facility] at [place] NAME: Chad [name]    MR#:  [id number]  DATE OF BIRTH:  09-Mar-1961  SUBJECTIVE:  CHIEF COMPLAINT:   Chief Complaint  Patient presents with  . Altered Mental Status  . Respiratory Distress   Brought with altered mental status and found to have sepsis, right-sided severe hydronephrosis and respiratory failure.  Intubated on admission, extubated on 04/29/18  Procedures Intubation- 04/25/18 Right-sided ureteral stent 04/25/18 Extubation 04/29/18 MRI brain 04/29/18  REVIEW OF SYSTEMS:   Patient is currently confused, after extubation and not communicative.  ROS  DRUG ALLERGIES:   Allergies  Allergen Reactions  . Penicillins Rash    VITALS:  Blood pressure 118/67, pulse (!) 50, temperature 98.9 F (37.2 C), temperature source Oral, resp. rate 17, height 6' (1.829 m), weight 74.8 kg, SpO2 98 %.  PHYSICAL EXAMINATION:   GENERAL:  57 y.o.-year-old patient lying in the bed critical appearing. EYES: Pupils equal, round, reactive to light . No scleral icterus.  HEENT: Head atraumatic, normocephalic. Oropharynx and nasopharynx clear.  NECK:  Supple, no jugular venous distention. No thyroid enlargement, no tenderness.  LUNGS: Normal breath sounds bilaterally, no wheezing, rales,rhonchi or crepitation. No use of accessory muscles of respiration.   CARDIOVASCULAR: S1, S2 normal. No murmurs, rubs, or gallops.  ABDOMEN: Soft, nontender, nondistended. Bowel sounds present. No organomegaly or mass.  Surgical scar present. EXTREMITIES: No pedal edema, cyanosis, or clubbing.  NEUROLOGIC: extubated, but confused and non communicative, moves limbs in confusion. PSYCHIATRIC: The patient is confused.  SKIN: No obvious rash, lesion, or ulcer.   Physical Exam LABORATORY PANEL:   CBC Recent Labs  Lab 04/29/18 0337  WBC 9.7  HGB 9.0*  HCT 27.3*  PLT 166    ------------------------------------------------------------------------------------------------------------------  Chemistries  Recent Labs  Lab 04/26/18 0540 04/27/18 0417  04/29/18 0337  NA 136 142   < > 151*  K 3.2* 3.1*   < > 3.9  CL 108 105   < > 114*  CO2 20* 28   < > 29  GLUCOSE 148* 106*   < > 112*  BUN 92* 81*   < > 52*  CREATININE 3.04* 2.74*   < > 1.38*  CALCIUM 6.7* 6.8*   < > 7.7*  MG  --  2.9*  --   --   AST 509*  --   --   --   ALT 157*  --   --   --   ALKPHOS 69  --   --   --   BILITOT 0.8  --   --   --    < > = values in this interval not displayed.   ------------------------------------------------------------------------------------------------------------------  Cardiac Enzymes Recent Labs  Lab 04/25/18 0751  TROPONINI 0.13*   ------------------------------------------------------------------------------------------------------------------  RADIOLOGY:  Mr Brain Wo Contrast  Addendum Date: 04/29/2018   ADDENDUM REPORT: 04/29/2018 16:13 ADDENDUM: Study discussed by telephone with Dr. Merton Border in the ICU on 04/29/2018 at 1607 hours. Electronically Signed   By: Genevie Ann M.D.   On: 04/29/2018 16:13   Result Date: 04/29/2018 CLINICAL DATA:  57 year old male found down outside. Suspected sepsis. Ataxia. EXAM: MRI HEAD WITHOUT CONTRAST TECHNIQUE: Multiplanar, multiecho pulse sequences of the brain and surrounding structures were obtained without intravenous contrast. COMPARISON:  Head CT without contrast 04/25/2018. FINDINGS: Brain: Chronic encephalomalacia in the left inferior frontal gyrus and right operculum. 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 Scattered small micro hemorrhages in both hemispheres, including concentrated at the splenium of the corpus callosum (image 33). Evidence of some Wallerian degeneration in the brainstem including at  the midbrain (series 10, image 10) and at the right medullary pyramid (image 4). Additionally there are small bilateral subdural hematomas, slightly larger in the left hemisphere measuring up to 4 millimeters in thickness. These were not apparent on the recent CT. No midline shift. No significant intracranial mass effect. No restricted diffusion to suggest acute infarction. Scattered bilateral nonspecific cerebral white matter T2 and FLAIR hyperintensity superimposed on the encephalomalacia described earlier. No chronic blood products in the posterior fossa. No evidence of mass lesion, ventriculomegaly. Cervicomedullary junction and pituitary are within normal limits. Vascular: Major intracranial vascular flow voids are preserved. Skull and upper cervical spine: Negative visible cervical spine. Nonspecific mildly decreased T1 bone marrow signal diffusely. No destructive osseous lesion identified. Sinuses/Orbits: Normal orbits soft tissues. Chronic paranasal sinus periosteal thickening plus mild mucosal thickening. The fluid level in the right sphenoid sinus has regressed. Other: Mild left mastoid effusion. Trace right mastoid fluid. Negative nasopharynx. Scalp and face soft tissues appear negative. IMPRESSION: 1. No acute infarct, but there are small bilateral Subdural Hematomas. That on the left is slightly larger measuring up to 4 millimeters in thickness. No associated intracranial mass effect. 2. Chronic encephalomalacia in the bilateral frontal lobes could be ischemic and/or posttraumatic. There are scattered chronic micro-hemorrhages in both hemispheres, including in the splenium of the corpus callosum which could be seen as the sequelae of trauma. There is mild superficial siderosis in the right hemisphere. Some Wallerian degeneration is evident in the right brainstem. 3. 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 Mild likely postinflammatory mastoid fluid. Electronically Signed: By: Genevie Ann M.D. On:  04/29/2018 15:19    ASSESSMENT AND PLAN:   Principal Problem:   Severe sepsis ([facility]) Active Problems:   Sepsis ([place])   Protein-calorie malnutrition, severe  *Septic shock, altered mental status due to metabolic encephalopathy secondary to sepsis Acute respiratory failure secondary to altered mental status, intubation.  IV fluids, follow lactic acid and cultures. Urinalysis and urine culture. Chest x-ray does not show any infiltrate.  Patient is a chronic smoker and have COPD. CT abdomen shows severe right-sided hydronephrosis possible obstruction and this is possibly the source of his sepsis.  Status post intubation and vent management in ICU. Empiric antibiotic broad-spectrum. Urologist and IR saw the patient, right ureteral stent was placed by urologist.  Inspite of extubation, pt still confused, MRI brain and manage per Intencivist.  *Elevated LFTs Likely due to septic shock, continue to monitor. Patient with chronic hepatitis C.   Coming down.  *Microcytic anemia As per son, since patient had colon cancer and surgery he is losing blood and he requires to go for blood transfusion every few months. Transfuse currently and continue to monitor. Avoid anticoagulants.  Stable now.  *Acute renal failure Likely secondary to obstruction Continue to monitor with IV fluids and decompression procedure as per urology and IR. Monitor renal function, improving. As per Urologist- There Is carcinoma found on Biopsy.  * Rhabdomyolysis    Pt was on floor for extended time- CK was high- came down/  *COPD and chronic smoking Currently no exacerbation symptoms, may continue bronchodilators.   All the records are reviewed and case discussed with Care Management/Social Workerr. Management plans discussed with the patient, family and they are in agreement.  CODE STATUS: Full.  TOTAL TIME TAKING CARE OF THIS PATIENT: 35 minutes.    POSSIBLE D/C IN 1-2 DAYS, DEPENDING ON  CLINICAL CONDITION.   Vaughan Basta M.D on 04/29/2018   Between 7am to 6pm - Pager - [phone]  After 6pm go to [email or web address] - password [email or web address] [facility]  Office  [phone]  CC: Primary care physician; [name], MD  Note: This dictation was prepared with Dragon dictation along with smaller phrase technology. Any transcriptional errors that result from this process are unintentional.

## 2018-04-29 NOTE — Progress Notes (Signed)
Pharmacy Electrolyte Monitoring Consult:  Pharmacy consulted to assist in monitoring and replacing electrolytes in this 57 y.o. male admitted on 04/25/2018 with Altered Mental Status and Respiratory Distress   Labs:  Sodium (mmol/L)  Date Value  04/29/2018 151 (H)   Potassium (mmol/L)  Date Value  04/29/2018 3.9   Magnesium (mg/dL)  Date Value  04/27/2018 2.9 (H)   Phosphorus (mg/dL)  Date Value  04/27/2018 3.5   Calcium (mg/dL)  Date Value  04/29/2018 7.7 (L)   Albumin (g/dL)  Date Value  04/26/2018 2.3 (L)    Assessment/Plan: Sodium 151 this morning. Dextrose 5% started at 199mL/hr. Rate decreased to 8mL/hr on rounds this morning. Will order BMP with morning labs to follow up sodium.   No additional supplementations required.    Pharmacy to continue to follow patient and replace electrolytes per consult.  Ocie Doyne, PharmD Candidate 04/29/2018 11:27 AM

## 2018-04-30 ENCOUNTER — Inpatient Hospital Stay: Payer: Medicaid Other

## 2018-04-30 LAB — CBC
HEMATOCRIT: 32.4 % — AB (ref 40.0–52.0)
Hemoglobin: 10.5 g/dL — ABNORMAL LOW (ref 13.0–18.0)
MCH: 21.3 pg — ABNORMAL LOW (ref 26.0–34.0)
MCHC: 32.4 g/dL (ref 32.0–36.0)
MCV: 65.7 fL — AB (ref 80.0–100.0)
Platelets: 177 10*3/uL (ref 150–440)
RBC: 4.93 MIL/uL (ref 4.40–5.90)
RDW: 37.7 % — AB (ref 11.5–14.5)
WBC: 10.1 10*3/uL (ref 3.8–10.6)

## 2018-04-30 LAB — BASIC METABOLIC PANEL
Anion gap: 6 (ref 5–15)
BUN: 48 mg/dL — AB (ref 6–20)
CHLORIDE: 114 mmol/L — AB (ref 98–111)
CO2: 28 mmol/L (ref 22–32)
Calcium: 7.6 mg/dL — ABNORMAL LOW (ref 8.9–10.3)
Creatinine, Ser: 1.16 mg/dL (ref 0.61–1.24)
GFR calc Af Amer: >60 mL/min (ref >60)
GFR calc non Af Amer: >60 mL/min (ref >60)
GLUCOSE: 111 mg/dL — AB (ref 70–99)
POTASSIUM: 3.8 mmol/L (ref 3.5–5.1)
SODIUM: 148 mmol/L — AB (ref 135–145)

## 2018-04-30 LAB — GLUCOSE, CAPILLARY
GLUCOSE-CAPILLARY: 94 mg/dL (ref 70–99)
Glucose-Capillary: 102 mg/dL — ABNORMAL HIGH (ref 70–99)
Glucose-Capillary: 102 mg/dL — ABNORMAL HIGH (ref 70–99)
Glucose-Capillary: 105 mg/dL — ABNORMAL HIGH (ref 70–99)
Glucose-Capillary: 108 mg/dL — ABNORMAL HIGH (ref 70–99)
Glucose-Capillary: 119 mg/dL — ABNORMAL HIGH (ref 70–99)
Glucose-Capillary: 121 mg/dL — ABNORMAL HIGH (ref 70–99)

## 2018-04-30 LAB — CULTURE, BLOOD (ROUTINE X 2)
CULTURE: NO GROWTH
Culture: NO GROWTH

## 2018-04-30 MED ORDER — PRO-STAT SUGAR FREE PO LIQD: 30.0000 mL | Freq: Two times a day (BID) | ORAL | Status: DC

## 2018-04-30 MED ORDER — SODIUM CHLORIDE 0.9 % IV SOLN
2.0000 g | Freq: Two times a day (BID) | INTRAVENOUS | Status: AC
  Administered 2018-04-30 – 2018-05-02 (×6): 2 g via INTRAVENOUS
  Filled 2018-04-30 (×6): qty 2

## 2018-04-30 MED ORDER — OSMOLITE 1.5 CAL PO LIQD: 1000.0000 mL | ORAL | Status: DC

## 2018-04-30 MED ORDER — JEVITY 1.2 CAL PO LIQD: 1000.0000 mL | ORAL | Status: DC

## 2018-04-30 MED ORDER — KETOROLAC TROMETHAMINE 15 MG/ML IJ SOLN
15.0000 mg | Freq: Once | INTRAMUSCULAR | Status: AC
  Administered 2018-04-30: 15 mg via INTRAVENOUS
  Filled 2018-04-30: qty 1

## 2018-04-30 NOTE — Progress Notes (Signed)
Pharmacy Electrolyte Monitoring Consult:  Pharmacy consulted to assist in monitoring and replacing electrolytes in this 57 y.o. male admitted on 04/25/2018 with Altered Mental Status and Respiratory Distress   Labs:  Sodium (mmol/L)  Date Value  04/30/2018 148 (H)   Potassium (mmol/L)  Date Value  04/30/2018 3.8   Magnesium (mg/dL)  Date Value  04/27/2018 2.9 (H)   Phosphorus (mg/dL)  Date Value  04/27/2018 3.5   Calcium (mg/dL)  Date Value  04/30/2018 7.6 (L)   Albumin (g/dL)  Date Value  04/26/2018 2.3 (L)    Assessment/Plan: Sodium has decreased from 151 yesterday to 148 today. Dextrose 5% at 50 mL/hr.   No electrolyte supplementation required today. Pharmacy will not order morning labs.  Pharmacy to continue to follow patient and replace electrolytes per consult.  Tawnya Crook, PharmD Pharmacy Resident  04/30/2018 12:24 PM

## 2018-04-30 NOTE — Progress Notes (Signed)
Follow up - Critical Care Medicine Note  Patient Details:    Chad [name] is an 57 y.o. male. Chad [name] is a 57 y.o. year old with history of colon cancer, Hep C who was found down by a neighbor brought in by EMS almost unresponsive and severely hypothermic, 61F.  He was intubated in the ICU and a noncontrast CT scan noted severe hydronephrosis without an obvious obstructing stone, transition point appears to be in the UPJ region.    Lines, Airways, Drains: Urethral Catheter Vonna Kotyk., RN Latex 16 Fr. (Active)  Indication for Insertion or Continuance of Catheter Acute urinary retention;Peri-operative use for selective surgical procedure 04/30/2018  8:00 AM  Site Assessment Clean;Intact;Dry 04/30/2018  8:00 AM  Catheter Maintenance Bag below level of bladder;Catheter secured;Drainage bag/tubing not touching floor;Insertion date on drainage bag;No dependent loops;Seal intact 04/30/2018  7:30 AM  Collection Container Standard drainage bag 04/30/2018  8:00 AM  Securement Method Securing device (Describe) 04/30/2018  8:00 AM  Urinary Catheter Interventions Unclamped 04/30/2018  8:00 AM     Ureteral Drain/Stent Right ureter 6 Fr. (Active)  Site Assessment Other (Comment) 04/30/2018  8:00 AM  Output (mL) 260 mL 04/28/2018  8:40 AM    Anti-infectives:  Anti-infectives (From admission, onward)   Start     Dose/Rate Route Frequency Ordered Stop   04/30/18 1000  ceFEPIme (MAXIPIME) 2 g in sodium chloride 0.9 % 100 mL IVPB     2 g 200 mL/hr over 30 Minutes Intravenous Every 12 hours 04/30/18 0928 05/03/18 0959   04/26/18 1000  ceFEPIme (MAXIPIME) 2 g in sodium chloride 0.9 % 100 mL IVPB  Status:  Discontinued     2 g 200 mL/hr over 30 Minutes Intravenous Every 24 hours 04/26/18 0915 04/30/18 0928   04/26/18 0600  vancomycin (VANCOCIN) IVPB 1000 mg/200 mL premix  Status:  Discontinued     1,000 mg 200 mL/hr over 60 Minutes Intravenous Every 36 hours 04/25/18 1039 04/26/18 1028   04/25/18 1045   vancomycin (VANCOCIN) IVPB 1000 mg/200 mL premix     1,000 mg 200 mL/hr over 60 Minutes Intravenous  Once 04/25/18 1039 04/25/18 1600   04/25/18 1038  vancomycin variable dose per unstable renal function (pharmacist dosing)  Status:  Discontinued      Does not apply See admin instructions 04/25/18 1039 04/26/18 0957   04/25/18 0945  levofloxacin (LEVAQUIN) IVPB 750 mg  Status:  Discontinued     750 mg 100 mL/hr over 90 Minutes Intravenous  Once 04/25/18 0937 04/25/18 0937   04/25/18 0945  ceFEPIme (MAXIPIME) 1 g in sodium chloride 0.9 % 100 mL IVPB     1 g 200 mL/hr over 30 Minutes Intravenous  Once 04/25/18 9373 04/25/18 1038      Microbiology: Results for orders placed or performed during the hospital encounter of 04/25/18  Culture, blood (routine x 2)     Status: None   Collection Time: 04/25/18  9:43 AM  Result Value Ref Range Status   Specimen Description BLOOD RIGHT ARM  Final   Special Requests   Final    BOTTLES DRAWN AEROBIC AND ANAEROBIC Blood Culture results may not be optimal due to an inadequate volume of blood received in culture bottles   Culture   Final    NO GROWTH 5 DAYS Performed at [facility], [address] [place], [place] [address]    Report Status 04/30/2018 FINAL  Final  Culture, blood (routine x 2)     Status:  None   Collection Time: 04/25/18  9:43 AM  Result Value Ref Range Status   Specimen Description BLOOD LEFT ARM  Final   Special Requests   Final    BOTTLES DRAWN AEROBIC AND ANAEROBIC Blood Culture results may not be optimal due to an inadequate volume of blood received in culture bottles   Culture   Final    NO GROWTH 5 DAYS Performed at [facility], [address], [place], [place] [address]    Report Status 04/30/2018 FINAL  Final  Urine culture     Status: None   Collection Time: 04/25/18  9:43 AM  Result Value Ref Range Status   Specimen Description   Final    URINE, CATHETERIZED Performed at [facility], [address] [place] [address], [place], [place] [address]    Special Requests   Final    NONE Performed at [facility], [address], [place], [place] [address]    Culture   Final    NO GROWTH Performed at [facility], [place] [address] [place], [place] [address]    Report Status 04/26/2018 FINAL  Final  MRSA PCR Screening     Status: None   Collection Time: 04/25/18 11:58 AM  Result Value Ref Range Status   MRSA by PCR NEGATIVE NEGATIVE Final    Comment:        The GeneXpert MRSA Assay (FDA approved for NASAL specimens only), is one component of a comprehensive MRSA colonization surveillance program. It is not intended to diagnose MRSA infection nor to guide or monitor treatment for MRSA infections. Performed at [facility], [address], [place], [place] [address]   Urine Culture     Status: None   Collection Time: 04/25/18  6:13 PM  Result Value Ref Range Status   Specimen Description   Final    URINE, RANDOM Performed at [facility], [address], [place], [place] [address]    Special Requests   Final    NONE Performed at [facility], [address], [place], [place] [address]    Culture   Final    NO GROWTH Performed at [facility], [place] [address], [place],  [address]    Report Status 04/27/2018 FINAL  Final   Studies: Ct Abdomen Pelvis Wo Contrast  Result Date: 04/25/2018 CLINICAL DATA:  Elevated liver functions and elevated serum creatinine. Unresponsive. EXAM: CT ABDOMEN AND PELVIS WITHOUT CONTRAST TECHNIQUE: Multidetector CT imaging of the abdomen and pelvis was performed following the standard protocol without oral or IV contrast. COMPARISON:  None. FINDINGS: Lower chest: There is bibasilar atelectasis with small bilateral pleural effusions. No pneumothorax in the lung bases. There are foci of coronary artery calcification. Hepatobiliary: No focal liver lesions are appreciable on this noncontrast  enhanced study. Gallbladder is distended without appreciable wall thickening. There is no biliary duct dilatation. Pancreas: No evident pancreatic mass or inflammatory focus. Spleen: No splenic lesions are appreciable. Adrenals/Urinary Tract: Adrenals bilaterally appear normal. There is an apparent cyst arising from the upper pole of the right kidney measuring 2.5 x 2.5 cm. There is scarring in the upper pole of the right kidney. There is severe hydronephrosis on the right. There is a cyst in the lower pole of the left kidney measuring 1.2 x 1.2 cm. There is a probable hyperdense cyst arising from the upper pole left kidney measuring 4 x 4 mm. There is no appreciable hydronephrosis on the left. There is  a 3 mm calculus in the lower pole of the right kidney. There is no evident ureteral calculus on either side. Urinary bladder is decompressed with a Foley catheter. Air in the urinary bladder is likely due to recent instrumentation. Stomach/Bowel: Patient has had a previous hemicolectomy. Postoperative changes with bowel anastomosis site appearing patent. There is no appreciable bowel wall or mesenteric thickening. No evident bowel obstruction. No appreciable free air or portal venous air. Nasogastric tube tip is in the stomach. Vascular/Lymphatic: There are foci aortoiliac atherosclerosis. No aneurysm evident. Major mesenteric arterial vessels appear patent on this noncontrast enhanced study. There is no evident adenopathy in the abdomen or pelvis. Reproductive: There are small prostatic calculi. Prostate and seminal vesicles appear normal in size and contour. No pelvic mass is evident. Other: There is nonspecific stranding in the lateral abdominopelvic junction regions bilaterally. There is no associated mass or fluid. These may be postoperative changes. There is no appreciable abscess or ascites in the abdomen or pelvis. No appreciable omental lesions. Musculoskeletal: There is degenerative change in each hip  joint. No blastic or lytic bone lesions are identified. No evident fracture or dislocation. No intramuscular lesions are appreciable. No abdominal wall lesions are appreciable. IMPRESSION: 1. Postoperative changes with evidence of previous hemicolectomy on the right. Anastomosis site appears patent. No bowel obstruction. No abscess in the abdomen or pelvis evident. Nasogastric tube tip in stomach. 2. Severe hydronephrosis on the right without obstructing focus evident. Question ureteropelvic junction on the right given the overall appearance. This finding may warrant nonemergent nuclear medicine Lasix renogram to further assess. 3. Nonobstructing calculus lower pole right kidney measuring 3 mm. Urinary bladder decompressed with Foley catheter. Air in the bladder is likely secondary to recent instrumentation. Prostatic calcifications noted. 4. There is aortic and iliac artery atherosclerosis. There are foci of coronary artery calcification. Aortic Atherosclerosis (ICD10-I70.0). Electronically Signed   By: Lowella Grip III M.D.   On: 04/25/2018 09:57   Ct Head Wo Contrast  Result Date: 04/25/2018 CLINICAL DATA:  Unresponsive EXAM: CT HEAD WITHOUT CONTRAST TECHNIQUE: Contiguous axial images were obtained from the base of the skull through the vertex without intravenous contrast. COMPARISON:  None. FINDINGS: Brain: No evidence of acute infarction, hemorrhage, extra-axial collection, ventriculomegaly, or mass effect. Encephalomalacia of the left inferior frontal lobe. 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 Generalized cerebral atrophy. Periventricular white matter low attenuation likely secondary to microangiopathy. Vascular: Cerebrovascular atherosclerotic calcifications are noted. Skull: Negative for fracture or focal lesion. Sinuses/Orbits: Visualized portions of the orbits are unremarkable. The mastoid sinuses are clear. Bilateral maxillary sinus mucosal thickening. Air-fluid level in the  right sphenoid sinus. Other: None. IMPRESSION: 1. No acute intracranial pathology. 2. Chronic microvascular disease and cerebral atrophy. Electronically Signed   By: Kathreen Devoid   On: 04/25/2018 09:50   Mr Brain Wo Contrast  Addendum Date: 04/29/2018   ADDENDUM REPORT: 04/29/2018 16:13 ADDENDUM: Study discussed by telephone with Dr. Merton Border in the ICU on 04/29/2018 at 1607 hours. Electronically Signed   By: Genevie Ann M.D.   On: 04/29/2018 16:13   Result Date: 04/29/2018 CLINICAL DATA:  57 year old male found down outside. Suspected sepsis. Ataxia. EXAM: MRI HEAD WITHOUT CONTRAST TECHNIQUE: Multiplanar, multiecho pulse sequences of the brain and surrounding structures were obtained without intravenous contrast. COMPARISON:  Head CT without contrast 04/25/2018. FINDINGS: Brain: Chronic encephalomalacia in the left inferior frontal gyrus and right operculum. 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 Scattered small micro hemorrhages in both hemispheres, including concentrated at the splenium of the corpus callosum (image 33). Evidence of some Wallerian degeneration in the brainstem including at the midbrain (series 10, image 10) and at the right medullary pyramid (image 4). Additionally there are small bilateral subdural hematomas, slightly larger in the left hemisphere measuring up to 4 millimeters in thickness. These were not apparent on the recent CT. No midline shift. No significant intracranial mass effect. No restricted diffusion to suggest acute infarction. Scattered bilateral nonspecific cerebral white matter T2 and FLAIR hyperintensity superimposed on the encephalomalacia described earlier. No chronic blood products in the posterior fossa. No evidence of mass lesion, ventriculomegaly. Cervicomedullary junction and pituitary are within normal limits. Vascular: Major intracranial vascular  flow voids are preserved. Skull and upper cervical spine: Negative visible cervical spine. Nonspecific mildly decreased T1 bone marrow signal diffusely. No destructive osseous lesion identified. Sinuses/Orbits: Normal orbits soft tissues. Chronic paranasal sinus periosteal thickening plus mild mucosal thickening. The fluid level in the right sphenoid sinus has regressed. Other: Mild left mastoid effusion. Trace right mastoid fluid. Negative nasopharynx. Scalp and face soft tissues appear negative. IMPRESSION: 1. No acute infarct, but there are small bilateral Subdural Hematomas. That on the left is slightly larger measuring up to 4 millimeters in thickness. No associated intracranial mass effect. 2. Chronic encephalomalacia in the bilateral frontal lobes could be ischemic and/or posttraumatic. There are scattered chronic micro-hemorrhages in both hemispheres, including in the splenium of the corpus callosum which could be seen as the sequelae of trauma. There is mild superficial siderosis in the right hemisphere. Some Wallerian degeneration is evident in the right brainstem. 3. 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 Mild likely postinflammatory mastoid fluid. Electronically Signed: By: Genevie Ann M.D. On: 04/29/2018 15:19   US Renal  Result Date: 04/27/2018 CLINICAL DATA:  History of right hydronephrosis, follow-up EXAM: RENAL / URINARY TRACT ULTRASOUND COMPLETE COMPARISON:  CT abdomen pelvis of 04/25/2017 FINDINGS: Right Kidney: Length: 10.8 cm. Only slight fullness of the renal calices is seen. The right renal pelvis is not significantly dilated. This represents and improvement when compared to the CT images of 04/25/2017. The renal parenchyma is somewhat echogenic which may indicate chronic renal medical disease. Correlate with renal laboratory values. Left Kidney: Length: 12.8 cm.  No hydronephrosis is seen. Bladder: The urinary bladder is decompressed with Foley catheter in place. IMPRESSION: 1. Slight  fullness of the right renal calices is noted which represents and improvement compared to the CT images recently. 2. No left hydronephrosis is seen. 3. Somewhat echogenic right renal parenchyma. Correlate with renal laboratory values. Electronically Signed   By: Ivar Drape M.D.   On: 04/27/2018 17:04   Dg Chest Port 1 View  Result Date: 04/26/2018 CLINICAL DATA:  Respiratory failure EXAM: PORTABLE CHEST 1 VIEW COMPARISON:  04/25/2018 FINDINGS: Endotracheal tube and NG tube remain in place, unchanged. No confluent airspace opacities or effusions. No acute bony abnormality. Heart is normal size. IMPRESSION: No significant change.  No active disease. Electronically Signed   By: Rolm Baptise M.D.   On: 04/26/2018 07:57   Dg Chest Portable 1 View  Result Date: 04/25/2018 CLINICAL DATA:  Hypoxia EXAM: PORTABLE CHEST 1 VIEW COMPARISON:  None. FINDINGS: Endotracheal tube tip is 4.5 cm above the carina. Nasogastric tube tip and side port are in the stomach. No evident pneumothorax. There is a small left pleural effusion with left base atelectasis. Lungs elsewhere clear.  Heart size and pulmonary vascularity are normal. No adenopathy. There is aortic atherosclerosis. No evident bone lesions. IMPRESSION: Tube positions as described without pneumothorax. Small pleural effusion with left base atelectasis. Lungs elsewhere clear. Heart size normal. There is aortic atherosclerosis. Aortic Atherosclerosis (ICD10-I70.0). Electronically Signed   By: Lowella Grip III M.D.   On: 04/25/2018 08:28   Dg Knee Complete 4 Views Left  Result Date: 04/21/2018 CLINICAL DATA:  Left knee pain for 6 years. EXAM: LEFT KNEE - COMPLETE 4+ VIEW COMPARISON:  None. FINDINGS: Marked femorotibial joint space narrowing is identified greatest along the lateral femorotibial compartment where there is bone-on-bone apposition with subchondral sclerosis and cystic change noted across the joint. Minimal spurring is noted off the femoral condyles  and lateral tibial plateau. Small joint effusion is identified on the lateral view within the suprapatellar compartment. No fracture or bone destruction is identified. IMPRESSION: Marked femorotibial joint space narrowing of the left knee more so along the lateral aspect where there is bone-on-bone apposition. Small suprapatellar joint effusion. No acute osseous abnormality. Electronically Signed   By: Ashley Royalty M.D.   On: 04/21/2018 15:38    Consults: Treatment Team:  Hollice Espy, MD   Subjective:    Overnight Issues: Patient remains confused/encephalopathic.  Presently on Precedex infusion Being followed by urology, appreciate input and assistance  Objective:  Vital signs for last 24 hours: Temp:  [98.2 F (36.8 C)-99.2 F (37.3 C)] 98.5 F (36.9 C) (10/04 0800) Pulse Rate:  [48-72] 52 (10/04 1000) Resp:  [9-21] 11 (10/04 1000) BP: (81-138)/(52-83) 94/73 (10/04 1000) SpO2:  [95 %-100 %] 97 % (10/04 1000) Weight:  [74.5 kg] 74.5 kg (10/04 0239)  Hemodynamic parameters for last 24 hours:    Intake/Output from previous day: 10/03 0701 - 10/04 0700 In: 1641.7 [I.V.:1641.7] Out: 750 [Urine:750]  Intake/Output this shift: Total I/O In: 149.9 [I.V.:149.9] Out: -   Vent settings for last 24 hours:    Physical Exam:  GENERAL: Extubated, somnolent, minimally arousable HEAD: Normocephalic, atraumatic.  EYES: Pupils equal, round, reactive to light.  No scleral icterus.  MOUTH: Moist mucosal membrane. NECK: Supple. No thyromegaly. No nodules. No JVD.  PULMONARY: +rhonchi, +wheezing CARDIOVASCULAR: S1 and S2. Regular rate and rhythm. No murmurs, rubs, or gallops.  GASTROINTESTINAL: Soft, nontender, -distended. No masses. Positive bowel sounds. No hepatosplenomegaly.  MUSCULOSKELETAL: No swelling, clubbing, or edema.  NEUROLOGIC: obtunded, GCS<8   Assessment/Plan:   57 year old gentleman, status post septic shock secondary to acute hydronephrosis, status post  extubation, encephalopathic on Precedex.  Patient with high-grade urothelial carcinoma, being followed by urology, etiology of obstruction/hydronephrosis, Foley catheter in place, positive urinary output.  Prerenal indices with BUN 48/creatinine 1.16  Encephalopathy.  MRI performed. See report, being followed by neurology.  Will wean Precedex as tolerated  IMPRESSION: 1. No acute infarct, but there are small bilateral Subdural Hematomas. That on the left is slightly larger measuring up to 4 millimeters in thickness. No associated intracranial mass effect.  2. Chronic encephalomalacia in the bilateral frontal lobes could be ischemic and/or posttraumatic. There are scattered chronic micro-hemorrhages in both hemispheres, including in the splenium of the corpus callosum which could be seen as the sequelae of trauma. There is mild superficial siderosis in the right hemisphere. Some Wallerian degeneration is evident in the right brainstem.  3. 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 Mild likely postinflammatory mastoid fluid.   Anemia with no evidence of active bleeding      Critical Care Total Time 35 minutes  Bryla Burek 04/30/2018  *Care during the described time interval was provided by me and/or other providers on the critical care team.  I have reviewed this patient's available data, including medical history, events of note, physical examination and test results as part of my evaluation.

## 2018-04-30 NOTE — Progress Notes (Signed)
Pharmacy Antibiotic Note  Pau Banh is a 57 y.o. male admitted on 04/25/2018 with sepsis secondary to pyelonephritis. S/p cystoscopy with right ureteral stent placement and replacement of Foley catheter. Patient originally with thick motor oil like appearance to the urine and elevated CK. Vancomycin discontinued 9/30 as concern is for urinary source. Pharmacy has been consulted for cefepime dosing.  Plan: Will increase cefepime to 2 g IV q12h as renal function has improved. Antibiotics to end 10/6  Height: 6' (182.9 cm) Weight: 164 lb 3.9 oz (74.5 kg) IBW/kg (Calculated) : 77.6  Temp (24hrs), Avg:98.7 F (37.1 C), Min:98.2 F (36.8 C), Max:99.2 F (37.3 C)  Recent Labs  Lab 04/25/18 1214  04/26/18 0118 04/26/18 0540 04/26/18 1041 04/27/18 0417 04/28/18 0430 04/29/18 0337 04/30/18 0457  WBC  --    < > 11.8*  --  7.9  --  10.2 9.7 10.1  CREATININE  --    < >  --  3.04*  --  2.74* 1.78* 1.38* 1.16  LATICACIDVEN 1.4  --   --   --   --   --   --   --   --    < > = values in this interval not displayed.    Estimated Creatinine Clearance: 72.3 mL/min (by C-G formula based on SCr of 1.16 mg/dL).    Allergies  Allergen Reactions  . Penicillins Rash    Antimicrobials this admission: Vancomycin 9/29 >> 9/30 Cefepime 9/29 >> 10/6  Dose adjustments this admission: 10/4 Cefepime 2 g IV q24h >> 2 g IV q12h  Microbiology results: 9/29 BCx: no growth 9/29 UCx: no growth 9/29 UCx: no growth 9/29 MRSA PCR: negative  Thank you for allowing pharmacy to be a part of this patient's care.  Tawnya Crook, PharmD Pharmacy Resident  04/30/2018 12:35 PM

## 2018-04-30 NOTE — Progress Notes (Signed)
[facility] [place] NAME: Chad [name]    MR#:  [id number]  DATE OF BIRTH:  08/25/1960  SUBJECTIVE:  CHIEF COMPLAINT:   Chief Complaint  Patient presents with  . Altered Mental Status  . Respiratory Distress   Brought with altered mental status and found to have sepsis, right-sided severe hydronephrosis and respiratory failure.  Intubated on admission, extubated on 04/29/18, remains confused.  Procedures Intubation- 04/25/18 Right-sided ureteral stent 04/25/18 Extubation 04/29/18 MRI brain 04/29/18  REVIEW OF SYSTEMS:   Patient is currently confused, after extubation and not communicative.  ROS  DRUG ALLERGIES:   Allergies  Allergen Reactions  . Penicillins Rash    VITALS:  Blood pressure 123/66, pulse 62, temperature 98.4 F (36.9 C), temperature source Axillary, resp. rate 14, height 6' (1.829 m), weight 74.5 kg, SpO2 97 %.  PHYSICAL EXAMINATION:   GENERAL:  57 y.o.-year-old patient lying in the bed critical appearing. EYES: Pupils equal, round, reactive to light . No scleral icterus.  HEENT: Head atraumatic, normocephalic. Oropharynx and nasopharynx clear.  NECK:  Supple, no jugular venous distention. No thyroid enlargement, no tenderness.  LUNGS: Normal breath sounds bilaterally, no wheezing, rales,rhonchi or crepitation. No use of accessory muscles of respiration.   CARDIOVASCULAR: S1, S2 normal. No murmurs, rubs, or gallops.  ABDOMEN: Soft, nontender, nondistended. Bowel sounds present. No organomegaly or mass.  Surgical scar present. EXTREMITIES: No pedal edema, cyanosis, or clubbing.  NEUROLOGIC: extubated, but confused and non communicative, moves limbs in confusion. PSYCHIATRIC: The patient is confused.  SKIN: No obvious rash, lesion, or ulcer.   Physical Exam LABORATORY PANEL:   CBC Recent Labs  Lab 04/30/18 0457  WBC 10.1  HGB 10.5*  HCT 32.4*  PLT 177    ------------------------------------------------------------------------------------------------------------------  Chemistries  Recent Labs  Lab 04/26/18 0540 04/27/18 0417  04/30/18 0457  NA 136 142   < > 148*  K 3.2* 3.1*   < > 3.8  CL 108 105   < > 114*  CO2 20* 28   < > 28  GLUCOSE 148* 106*   < > 111*  BUN 92* 81*   < > 48*  CREATININE 3.04* 2.74*   < > 1.16  CALCIUM 6.7* 6.8*   < > 7.6*  MG  --  2.9*  --   --   AST 509*  --   --   --   ALT 157*  --   --   --   ALKPHOS 69  --   --   --   BILITOT 0.8  --   --   --    < > = values in this interval not displayed.   ------------------------------------------------------------------------------------------------------------------  Cardiac Enzymes Recent Labs  Lab 04/25/18 0751  TROPONINI 0.13*   ------------------------------------------------------------------------------------------------------------------  RADIOLOGY:  Mr Brain Wo Contrast  Addendum Date: 04/29/2018   ADDENDUM REPORT: 04/29/2018 16:13 ADDENDUM: Study discussed by telephone with Dr. Merton Border in the ICU on 04/29/2018 at 1607 hours. Electronically Signed   By: Genevie Ann M.D.   On: 04/29/2018 16:13   Result Date: 04/29/2018 CLINICAL DATA:  57 year old male found down outside. Suspected sepsis. Ataxia. EXAM: MRI HEAD WITHOUT CONTRAST TECHNIQUE: Multiplanar, multiecho pulse sequences of the brain and surrounding structures were obtained without intravenous contrast. COMPARISON:  Head CT without contrast 04/25/2018. FINDINGS: Brain: Chronic encephalomalacia in the left inferior frontal gyrus and right operculum. 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 Scattered small micro hemorrhages in both hemispheres, including concentrated at the splenium of the corpus callosum (image 33). Evidence of some Wallerian degeneration in the brainstem including at  the midbrain (series 10, image 10) and at the right medullary pyramid (image 4). Additionally there are small bilateral subdural hematomas, slightly larger in the left hemisphere measuring up to 4 millimeters in thickness. These were not apparent on the recent CT. No midline shift. No significant intracranial mass effect. No restricted diffusion to suggest acute infarction. Scattered bilateral nonspecific cerebral white matter T2 and FLAIR hyperintensity superimposed on the encephalomalacia described earlier. No chronic blood products in the posterior fossa. No evidence of mass lesion, ventriculomegaly. Cervicomedullary junction and pituitary are within normal limits. Vascular: Major intracranial vascular flow voids are preserved. Skull and upper cervical spine: Negative visible cervical spine. Nonspecific mildly decreased T1 bone marrow signal diffusely. No destructive osseous lesion identified. Sinuses/Orbits: Normal orbits soft tissues. Chronic paranasal sinus periosteal thickening plus mild mucosal thickening. The fluid level in the right sphenoid sinus has regressed. Other: Mild left mastoid effusion. Trace right mastoid fluid. Negative nasopharynx. Scalp and face soft tissues appear negative. IMPRESSION: 1. No acute infarct, but there are small bilateral Subdural Hematomas. That on the left is slightly larger measuring up to 4 millimeters in thickness. No associated intracranial mass effect. 2. Chronic encephalomalacia in the bilateral frontal lobes could be ischemic and/or posttraumatic. There are scattered chronic micro-hemorrhages in both hemispheres, including in the splenium of the corpus callosum which could be seen as the sequelae of trauma. There is mild superficial siderosis in the right hemisphere. Some Wallerian degeneration is evident in the right brainstem. 3. 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 Mild likely postinflammatory mastoid fluid. Electronically Signed: By: Genevie Ann M.D. On:  04/29/2018 15:19   Dg Abd Portable 1v  Result Date: 04/30/2018 CLINICAL DATA:  NG tube placement EXAM: PORTABLE ABDOMEN - 1 VIEW COMPARISON:  04/30/2018 FINDINGS: Esophageal tube tip has been advanced, somewhat abrupt turn to the left distally. Consolidation at the left base. Right-sided ureteral stent. Mild diffuse increased small and large bowel gas. IMPRESSION: 1. Esophageal tube tip with slightly abrupt turn to the left distally, not sure if this represents kinking of the tube at the gastric outlet or projection of the tube over the proximal duodenum. 2. Consolidation at the left base. 3. Mild diffuse increased bowel gas, possible ileus Electronically Signed   By: Donavan Foil M.D.   On: 04/30/2018 16:45   Dg Abd Portable 1v  Result Date: 04/30/2018 CLINICAL DATA:  NG tube placement EXAM: PORTABLE ABDOMEN - 1 VIEW COMPARISON:  CT 04/25/2018 FINDINGS: Left basilar consolidation. Esophageal tube tip projects over GE junction. Mild diffuse gaseous enlargement of the bowel. IMPRESSION: Esophageal tube tip overlies the GE junction. Consolidation at the left lung base. Mild diffuse increased bowel gas throughout, possible ileus Electronically Signed   By: Donavan Foil M.D.   On: 04/30/2018 16:44    ASSESSMENT AND PLAN:   Principal Problem:   Severe sepsis (HCC) Active Problems:   Sepsis ([place])   Protein-calorie malnutrition, severe  *Septic shock, altered mental status due to metabolic encephalopathy secondary to sepsis Acute respiratory failure secondary to altered mental status, intubation.  IV fluids, follow lactic acid and cultures. Urinalysis and urine culture. Chest x-ray does not show any infiltrate.  Patient is a chronic smoker and have COPD. CT abdomen shows severe right-sided hydronephrosis possible obstruction and this is  possibly the source of his sepsis.  Status post intubation and vent management in ICU. Empiric antibiotic broad-spectrum. Urologist and IR saw the patient,  right ureteral stent was placed by urologist.  Inspite of extubation, pt still confused, MRI brain and manage per Intencivist. It shows some findings of bleed.seems chronuc finding or  May be due to contusion- manage per ICU team.  *Elevated LFTs Likely due to septic shock, continue to monitor. Patient with chronic hepatitis C.   Coming down.  *Microcytic anemia As per son, since patient had colon cancer and surgery he is losing blood and he requires to go for blood transfusion every few months. Transfuse currently and continue to monitor. Avoid anticoagulants.  Stable now.  *Acute renal failure Likely secondary to obstruction Continue to monitor with IV fluids and decompression procedure as per urology and IR. Monitor renal function, improving. As per Urologist- There Is carcinoma found on Biopsy.  * Rhabdomyolysis    Pt was on floor for extended time- CK was high- came down/  *COPD and chronic smoking Currently no exacerbation symptoms, may continue bronchodilators.   All the records are reviewed and case discussed with Care Management/Social Workerr. Management plans discussed with the patient, family and they are in agreement.  CODE STATUS: Full.  TOTAL TIME TAKING CARE OF THIS PATIENT: 35 minutes.    POSSIBLE D/C IN 1-2 DAYS, DEPENDING ON CLINICAL CONDITION.   Vaughan Basta M.D on 04/30/2018   Between 7am to 6pm - Pager - [phone]  After 6pm go to [email or web address] - password [email or web address] [facility]  Office  [phone]  CC: Primary care physician; [name], MD  Note: This dictation was prepared with Dragon dictation along with smaller phrase technology. Any transcriptional errors that result from this process are unintentional.

## 2018-04-30 NOTE — Progress Notes (Signed)
Urology consult follow-up  Patient remains encephalopathic, agitated and not oriented, not following commands.  In restraints on Precedex.  Foley draining well.  Creatinine has normalized.  Vitals reviewed Extubated, breathing normally No acute distress Foley catheter in place draining concentrated appearing urine, no blood or clots Extremities in mittens  Assessment/plan: 57 year old male found down, hypothermic with multiorgan failure including right hydronephrosis status post emergent ureteral stent placement with adequate decompression of his right collecting system.  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  Patient will ultimately need further staging imaging with contrasted CT abdomen pelvis if he improves clinically prior to discharge.  Prefer to hold on this until patient can tolerate lying still to decrease motion artifact.  Urology will sign off over the weekend, please contact with any questions or concerns.  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  Hollice Espy, MD

## 2018-04-30 NOTE — Progress Notes (Signed)
Nutrition Follow-up  DOCUMENTATION CODES:   Severe malnutrition in context of chronic illness  INTERVENTION:  Once NGT placed and confirmed, initiate Osmolite 1.5 Cal at 50 mL/hr (1200 mL goal daily volume) + Pro-Stat 30 mL BID. Provides 2000 kcal, 105 grams of protein, 912 mL H2O daily.  As patient is on D5W provide free water flush of 30 mL Q4hrs to maintain tube patency.  Recommend starting bowel regimen as patient has not had a documented bowel movement this admission.  NUTRITION DIAGNOSIS:   Severe Malnutrition related to chronic illness(hx CVA, hx prostate cancer) as evidenced by severe fat depletion, moderate muscle depletion, severe muscle depletion.  Ongoing - addressing with TF regimen.  GOAL:   Provide needs based on ASPEN/SCCM guidelines  Met with TF regimen.  MONITOR:   Vent status, Labs, Weight trends, TF tolerance, I & O's  REASON FOR ASSESSMENT:   Ventilator    ASSESSMENT:   57 year old male with PMHx of CVA, HLD, depression, anxiety, prostate cancer admitted with severe septic shock, acute pyelonephritis, severe right hydronephrosis s/p cytoscopy and right ureteral stent placement 9/29, severe respiratory failure requiring intubation on 9/29, renal failure, rhabdomyolysis.   -Patient was extubated on 10/2 and OGT was removed. -Patient found to have high-grade urothelial carcinoma, which is the etiology of the obstruction/hydronephrosis.  Patient has been confused/encephalopathic after extubation and is on Precedex gtt. Not appropriate for BSE or PO intake. Abdomen is soft. No documented bowel movement this admission.  Enteral Access: pending Dobbhoff tube placement  Medications reviewed and include: folic acid 1 mg daily IV, gabapentin, pantoprazole, Seroquel 50 mg QHS, thiamine 100 mg daily IV, cefepime, Precedex gtt, D5W at 50 mL/hr (60 grams dextrose, 204 kcal daily).  Labs reviewed: CBG 102-121, Sodium 148 (trending down), Chloride 114, BUN  48.  I/O: 750 mL UOP overnight  Weight trend: 74.5 kg on 10/4; +7.8 kg from admission  Discussed with RN and on rounds. Plan is for placement of Dobbhoff tube today for enteral nutrition.  Diet Order:   Diet Order            Diet NPO time specified  Diet effective now              EDUCATION NEEDS:   No education needs have been identified at this time  Skin:  Skin Assessment: Reviewed RN Assessment(scattered petechiae)  Last BM:  Unknown/PTA  Height:   Ht Readings from Last 1 Encounters:  04/25/18 6' (1.829 m)    Weight:   Wt Readings from Last 1 Encounters:  04/30/18 74.5 kg    Ideal Body Weight:  80.9 kg  BMI:  Body mass index is 22.28 kg/m.  Estimated Nutritional Needs:   Kcal:  1850-2160 (MSJ x 1.2-1.4)  Protein:  95-110 grams (1.4-1.6 grams/kg)  Fluid:  2-2.4 L/day (30-35 mL/kg)  Willey Blade, MS, RD, LDN Office: [phone] Pager: [phone] After Hours/Weekend Pager: [phone]

## 2018-05-01 LAB — FIBRINOGEN: FIBRINOGEN: 418 mg/dL (ref 210–475)

## 2018-05-01 LAB — CBC WITH DIFFERENTIAL/PLATELET
Basophils Absolute: 0.1 10*3/uL (ref 0–0.1)
Basophils Relative: 1 %
Eosinophils Absolute: 0.4 10*3/uL (ref 0–0.7)
Eosinophils Relative: 4 %
HEMATOCRIT: 30.8 % — AB (ref 40.0–52.0)
Hemoglobin: 9.4 g/dL — ABNORMAL LOW (ref 13.0–18.0)
LYMPHS ABS: 0.9 10*3/uL — AB (ref 1.0–3.6)
Lymphocytes Relative: 10 %
MCH: 20 pg — AB (ref 26.0–34.0)
MCHC: 30.6 g/dL — ABNORMAL LOW (ref 32.0–36.0)
MCV: 65.4 fL — AB (ref 80.0–100.0)
MONO ABS: 0.3 10*3/uL (ref 0.2–1.0)
MONOS PCT: 4 %
NEUTROS ABS: 7.3 10*3/uL — AB (ref 1.4–6.5)
Neutrophils Relative %: 81 %
Platelets: 191 10*3/uL (ref 150–440)
RBC: 4.71 MIL/uL (ref 4.40–5.90)
RDW: 38 % — ABNORMAL HIGH (ref 11.5–14.5)
WBC: 9 10*3/uL (ref 3.8–10.6)

## 2018-05-01 LAB — BASIC METABOLIC PANEL
Anion gap: 6 (ref 5–15)
BUN: 44 mg/dL — AB (ref 6–20)
CHLORIDE: 114 mmol/L — AB (ref 98–111)
CO2: 27 mmol/L (ref 22–32)
Calcium: 7.5 mg/dL — ABNORMAL LOW (ref 8.9–10.3)
Creatinine, Ser: 1.17 mg/dL (ref 0.61–1.24)
GFR calc Af Amer: >60 mL/min (ref >60)
GLUCOSE: 120 mg/dL — AB (ref 70–99)
POTASSIUM: 3.8 mmol/L (ref 3.5–5.1)
Sodium: 147 mmol/L — ABNORMAL HIGH (ref 135–145)

## 2018-05-01 LAB — GLUCOSE, CAPILLARY
GLUCOSE-CAPILLARY: 108 mg/dL — AB (ref 70–99)
GLUCOSE-CAPILLARY: 110 mg/dL — AB (ref 70–99)
Glucose-Capillary: 82 mg/dL (ref 70–99)

## 2018-05-01 LAB — APTT: aPTT: 36 seconds (ref 24–36)

## 2018-05-01 LAB — C-REACTIVE PROTEIN: CRP: 9.7 mg/dL — AB (ref <1.0)

## 2018-05-01 LAB — PROCALCITONIN: PROCALCITONIN: 0.46 ng/mL

## 2018-05-01 LAB — PROTIME-INR
INR: 1.3
Prothrombin Time: 16.1 seconds — ABNORMAL HIGH (ref 11.4–15.2)

## 2018-05-01 LAB — SEDIMENTATION RATE: SED RATE: 45 mm/h — AB (ref 0–20)

## 2018-05-01 LAB — PHOSPHORUS: Phosphorus: 2.7 mg/dL (ref 2.5–4.6)

## 2018-05-01 MED ORDER — MORPHINE SULFATE (PF) 2 MG/ML IV SOLN
1.0000 mg | INTRAVENOUS | Status: DC | PRN
  Administered 2018-05-01 – 2018-05-03 (×6): 1 mg via INTRAVENOUS
  Filled 2018-05-01 (×6): qty 1

## 2018-05-01 NOTE — Progress Notes (Addendum)
Follow up - Critical Care Medicine Note  Patient Details:    Chad [name] is an 57 y.o. male. Chad [name] is a 57 y.o. year old with history of colon cancer, Hep C who was found down by a neighbor brought in by EMS almost unresponsive and severely hypothermic, 61F.  He was intubated in the ICU and a noncontrast CT scan noted severe hydronephrosis without an obvious obstructing stone, transition point appears to be in the UPJ region.    Lines, Airways, Drains: Urethral Catheter Vonna Kotyk., RN Latex 16 Fr. (Active)  Indication for Insertion or Continuance of Catheter Acute urinary retention;Peri-operative use for selective surgical procedure 04/30/2018  8:00 AM  Site Assessment Clean;Intact;Dry 04/30/2018  8:00 AM  Catheter Maintenance Bag below level of bladder;Catheter secured;Drainage bag/tubing not touching floor;Insertion date on drainage bag;No dependent loops;Seal intact 04/30/2018  7:30 AM  Collection Container Standard drainage bag 04/30/2018  8:00 AM  Securement Method Securing device (Describe) 04/30/2018  8:00 AM  Urinary Catheter Interventions Unclamped 04/30/2018  8:00 AM     Ureteral Drain/Stent Right ureter 6 Fr. (Active)  Site Assessment Other (Comment) 04/30/2018  8:00 AM  Output (mL) 260 mL 04/28/2018  8:40 AM    Anti-infectives:  Anti-infectives (From admission, onward)   Start     Dose/Rate Route Frequency Ordered Stop   04/30/18 1000  ceFEPIme (MAXIPIME) 2 g in sodium chloride 0.9 % 100 mL IVPB     2 g 200 mL/hr over 30 Minutes Intravenous Every 12 hours 04/30/18 0928 05/03/18 0959   04/26/18 1000  ceFEPIme (MAXIPIME) 2 g in sodium chloride 0.9 % 100 mL IVPB  Status:  Discontinued     2 g 200 mL/hr over 30 Minutes Intravenous Every 24 hours 04/26/18 0915 04/30/18 0928   04/26/18 0600  vancomycin (VANCOCIN) IVPB 1000 mg/200 mL premix  Status:  Discontinued     1,000 mg 200 mL/hr over 60 Minutes Intravenous Every 36 hours 04/25/18 1039 04/26/18 1028   04/25/18 1045   vancomycin (VANCOCIN) IVPB 1000 mg/200 mL premix     1,000 mg 200 mL/hr over 60 Minutes Intravenous  Once 04/25/18 1039 04/25/18 1600   04/25/18 1038  vancomycin variable dose per unstable renal function (pharmacist dosing)  Status:  Discontinued      Does not apply See admin instructions 04/25/18 1039 04/26/18 0957   04/25/18 0945  levofloxacin (LEVAQUIN) IVPB 750 mg  Status:  Discontinued     750 mg 100 mL/hr over 90 Minutes Intravenous  Once 04/25/18 0937 04/25/18 0937   04/25/18 0945  ceFEPIme (MAXIPIME) 1 g in sodium chloride 0.9 % 100 mL IVPB     1 g 200 mL/hr over 30 Minutes Intravenous  Once 04/25/18 9373 04/25/18 1038      Microbiology: Results for orders placed or performed during the hospital encounter of 04/25/18  Culture, blood (routine x 2)     Status: None   Collection Time: 04/25/18  9:43 AM  Result Value Ref Range Status   Specimen Description BLOOD RIGHT ARM  Final   Special Requests   Final    BOTTLES DRAWN AEROBIC AND ANAEROBIC Blood Culture results may not be optimal due to an inadequate volume of blood received in culture bottles   Culture   Final    NO GROWTH 5 DAYS Performed at [facility], [address] [place], [place] [address]    Report Status 04/30/2018 FINAL  Final  Culture, blood (routine x 2)     Status:  None   Collection Time: 04/25/18  9:43 AM  Result Value Ref Range Status   Specimen Description BLOOD LEFT ARM  Final   Special Requests   Final    BOTTLES DRAWN AEROBIC AND ANAEROBIC Blood Culture results may not be optimal due to an inadequate volume of blood received in culture bottles   Culture   Final    NO GROWTH 5 DAYS Performed at [facility], [address], [place], [place] [address]    Report Status 04/30/2018 FINAL  Final  Urine culture     Status: None   Collection Time: 04/25/18  9:43 AM  Result Value Ref Range Status   Specimen Description   Final    URINE, CATHETERIZED Performed at [facility], [address] [place] [address], [place], [place] [address]    Special Requests   Final    NONE Performed at [facility], [address], [place], [place] [address]    Culture   Final    NO GROWTH Performed at [facility], [place] [address] [place], [place] [address]    Report Status 04/26/2018 FINAL  Final  MRSA PCR Screening     Status: None   Collection Time: 04/25/18 11:58 AM  Result Value Ref Range Status   MRSA by PCR NEGATIVE NEGATIVE Final    Comment:        The GeneXpert MRSA Assay (FDA approved for NASAL specimens only), is one component of a comprehensive MRSA colonization surveillance program. It is not intended to diagnose MRSA infection nor to guide or monitor treatment for MRSA infections. Performed at [facility], [address], [place], [place] [address]   Urine Culture     Status: None   Collection Time: 04/25/18  6:13 PM  Result Value Ref Range Status   Specimen Description   Final    URINE, RANDOM Performed at [facility], [address], [place], [place] [address]    Special Requests   Final    NONE Performed at [facility], [address], [place], [place] [address]    Culture   Final    NO GROWTH Performed at [facility], [place] [address], [place],  [address]    Report Status 04/27/2018 FINAL  Final   Studies: Ct Abdomen Pelvis Wo Contrast  Result Date: 04/25/2018 CLINICAL DATA:  Elevated liver functions and elevated serum creatinine. Unresponsive. EXAM: CT ABDOMEN AND PELVIS WITHOUT CONTRAST TECHNIQUE: Multidetector CT imaging of the abdomen and pelvis was performed following the standard protocol without oral or IV contrast. COMPARISON:  None. FINDINGS: Lower chest: There is bibasilar atelectasis with small bilateral pleural effusions. No pneumothorax in the lung bases. There are foci of coronary artery calcification. Hepatobiliary: No focal liver lesions are appreciable on this noncontrast  enhanced study. Gallbladder is distended without appreciable wall thickening. There is no biliary duct dilatation. Pancreas: No evident pancreatic mass or inflammatory focus. Spleen: No splenic lesions are appreciable. Adrenals/Urinary Tract: Adrenals bilaterally appear normal. There is an apparent cyst arising from the upper pole of the right kidney measuring 2.5 x 2.5 cm. There is scarring in the upper pole of the right kidney. There is severe hydronephrosis on the right. There is a cyst in the lower pole of the left kidney measuring 1.2 x 1.2 cm. There is a probable hyperdense cyst arising from the upper pole left kidney measuring 4 x 4 mm. There is no appreciable hydronephrosis on the left. There is  a 3 mm calculus in the lower pole of the right kidney. There is no evident ureteral calculus on either side. Urinary bladder is decompressed with a Foley catheter. Air in the urinary bladder is likely due to recent instrumentation. Stomach/Bowel: Patient has had a previous hemicolectomy. Postoperative changes with bowel anastomosis site appearing patent. There is no appreciable bowel wall or mesenteric thickening. No evident bowel obstruction. No appreciable free air or portal venous air. Nasogastric tube tip is in the stomach. Vascular/Lymphatic: There are foci aortoiliac atherosclerosis. No aneurysm evident. Major mesenteric arterial vessels appear patent on this noncontrast enhanced study. There is no evident adenopathy in the abdomen or pelvis. Reproductive: There are small prostatic calculi. Prostate and seminal vesicles appear normal in size and contour. No pelvic mass is evident. Other: There is nonspecific stranding in the lateral abdominopelvic junction regions bilaterally. There is no associated mass or fluid. These may be postoperative changes. There is no appreciable abscess or ascites in the abdomen or pelvis. No appreciable omental lesions. Musculoskeletal: There is degenerative change in each hip  joint. No blastic or lytic bone lesions are identified. No evident fracture or dislocation. No intramuscular lesions are appreciable. No abdominal wall lesions are appreciable. IMPRESSION: 1. Postoperative changes with evidence of previous hemicolectomy on the right. Anastomosis site appears patent. No bowel obstruction. No abscess in the abdomen or pelvis evident. Nasogastric tube tip in stomach. 2. Severe hydronephrosis on the right without obstructing focus evident. Question ureteropelvic junction on the right given the overall appearance. This finding may warrant nonemergent nuclear medicine Lasix renogram to further assess. 3. Nonobstructing calculus lower pole right kidney measuring 3 mm. Urinary bladder decompressed with Foley catheter. Air in the bladder is likely secondary to recent instrumentation. Prostatic calcifications noted. 4. There is aortic and iliac artery atherosclerosis. There are foci of coronary artery calcification. Aortic Atherosclerosis (ICD10-I70.0). Electronically Signed   By: Lowella Grip III M.D.   On: 04/25/2018 09:57   Ct Head Wo Contrast  Result Date: 04/25/2018 CLINICAL DATA:  Unresponsive EXAM: CT HEAD WITHOUT CONTRAST TECHNIQUE: Contiguous axial images were obtained from the base of the skull through the vertex without intravenous contrast. COMPARISON:  None. FINDINGS: Brain: No evidence of acute infarction, hemorrhage, extra-axial collection, ventriculomegaly, or mass effect. Encephalomalacia of the left inferior frontal lobe. 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 Generalized cerebral atrophy. Periventricular white matter low attenuation likely secondary to microangiopathy. Vascular: Cerebrovascular atherosclerotic calcifications are noted. Skull: Negative for fracture or focal lesion. Sinuses/Orbits: Visualized portions of the orbits are unremarkable. The mastoid sinuses are clear. Bilateral maxillary sinus mucosal thickening. Air-fluid level in the  right sphenoid sinus. Other: None. IMPRESSION: 1. No acute intracranial pathology. 2. Chronic microvascular disease and cerebral atrophy. Electronically Signed   By: Kathreen Devoid   On: 04/25/2018 09:50   Mr Brain Wo Contrast  Addendum Date: 04/29/2018   ADDENDUM REPORT: 04/29/2018 16:13 ADDENDUM: Study discussed by telephone with Dr. Merton Border in the ICU on 04/29/2018 at 1607 hours. Electronically Signed   By: Genevie Ann M.D.   On: 04/29/2018 16:13   Result Date: 04/29/2018 CLINICAL DATA:  57 year old male found down outside. Suspected sepsis. Ataxia. EXAM: MRI HEAD WITHOUT CONTRAST TECHNIQUE: Multiplanar, multiecho pulse sequences of the brain and surrounding structures were obtained without intravenous contrast. COMPARISON:  Head CT without contrast 04/25/2018. FINDINGS: Brain: Chronic encephalomalacia in the left inferior frontal gyrus and right operculum. 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 Scattered small micro hemorrhages in both hemispheres, including concentrated at the splenium of the corpus callosum (image 33). Evidence of some Wallerian degeneration in the brainstem including at the midbrain (series 10, image 10) and at the right medullary pyramid (image 4). Additionally there are small bilateral subdural hematomas, slightly larger in the left hemisphere measuring up to 4 millimeters in thickness. These were not apparent on the recent CT. No midline shift. No significant intracranial mass effect. No restricted diffusion to suggest acute infarction. Scattered bilateral nonspecific cerebral white matter T2 and FLAIR hyperintensity superimposed on the encephalomalacia described earlier. No chronic blood products in the posterior fossa. No evidence of mass lesion, ventriculomegaly. Cervicomedullary junction and pituitary are within normal limits. Vascular: Major intracranial vascular  flow voids are preserved. Skull and upper cervical spine: Negative visible cervical spine. Nonspecific mildly decreased T1 bone marrow signal diffusely. No destructive osseous lesion identified. Sinuses/Orbits: Normal orbits soft tissues. Chronic paranasal sinus periosteal thickening plus mild mucosal thickening. The fluid level in the right sphenoid sinus has regressed. Other: Mild left mastoid effusion. Trace right mastoid fluid. Negative nasopharynx. Scalp and face soft tissues appear negative. IMPRESSION: 1. No acute infarct, but there are small bilateral Subdural Hematomas. That on the left is slightly larger measuring up to 4 millimeters in thickness. No associated intracranial mass effect. 2. Chronic encephalomalacia in the bilateral frontal lobes could be ischemic and/or posttraumatic. There are scattered chronic micro-hemorrhages in both hemispheres, including in the splenium of the corpus callosum which could be seen as the sequelae of trauma. There is mild superficial siderosis in the right hemisphere. Some Wallerian degeneration is evident in the right brainstem. 3. 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 Mild likely postinflammatory mastoid fluid. Electronically Signed: By: Genevie Ann M.D. On: 04/29/2018 15:19   US Renal  Result Date: 04/27/2018 CLINICAL DATA:  History of right hydronephrosis, follow-up EXAM: RENAL / URINARY TRACT ULTRASOUND COMPLETE COMPARISON:  CT abdomen pelvis of 04/25/2017 FINDINGS: Right Kidney: Length: 10.8 cm. Only slight fullness of the renal calices is seen. The right renal pelvis is not significantly dilated. This represents and improvement when compared to the CT images of 04/25/2017. The renal parenchyma is somewhat echogenic which may indicate chronic renal medical disease. Correlate with renal laboratory values. Left Kidney: Length: 12.8 cm.  No hydronephrosis is seen. Bladder: The urinary bladder is decompressed with Foley catheter in place. IMPRESSION: 1. Slight  fullness of the right renal calices is noted which represents and improvement compared to the CT images recently. 2. No left hydronephrosis is seen. 3. Somewhat echogenic right renal parenchyma. Correlate with renal laboratory values. Electronically Signed   By: Ivar Drape M.D.   On: 04/27/2018 17:04   Dg Chest Port 1 View  Result Date: 04/26/2018 CLINICAL DATA:  Respiratory failure EXAM: PORTABLE CHEST 1 VIEW COMPARISON:  04/25/2018 FINDINGS: Endotracheal tube and NG tube remain in place, unchanged. No confluent airspace opacities or effusions. No acute bony abnormality. Heart is normal size. IMPRESSION: No significant change.  No active disease. Electronically Signed   By: Rolm Baptise M.D.   On: 04/26/2018 07:57   Dg Chest Portable 1 View  Result Date: 04/25/2018 CLINICAL DATA:  Hypoxia EXAM: PORTABLE CHEST 1 VIEW COMPARISON:  None. FINDINGS: Endotracheal tube tip is 4.5 cm above the carina. Nasogastric tube tip and side port are in the stomach. No evident pneumothorax. There is a small left pleural effusion with left base atelectasis. Lungs elsewhere clear.  Heart size and pulmonary vascularity are normal. No adenopathy. There is aortic atherosclerosis. No evident bone lesions. IMPRESSION: Tube positions as described without pneumothorax. Small pleural effusion with left base atelectasis. Lungs elsewhere clear. Heart size normal. There is aortic atherosclerosis. Aortic Atherosclerosis (ICD10-I70.0). Electronically Signed   By: Lowella Grip III M.D.   On: 04/25/2018 08:28   Dg Knee Complete 4 Views Left  Result Date: 04/21/2018 CLINICAL DATA:  Left knee pain for 6 years. EXAM: LEFT KNEE - COMPLETE 4+ VIEW COMPARISON:  None. FINDINGS: Marked femorotibial joint space narrowing is identified greatest along the lateral femorotibial compartment where there is bone-on-bone apposition with subchondral sclerosis and cystic change noted across the joint. Minimal spurring is noted off the femoral condyles  and lateral tibial plateau. Small joint effusion is identified on the lateral view within the suprapatellar compartment. No fracture or bone destruction is identified. IMPRESSION: Marked femorotibial joint space narrowing of the left knee more so along the lateral aspect where there is bone-on-bone apposition. Small suprapatellar joint effusion. No acute osseous abnormality. Electronically Signed   By: Ashley Royalty M.D.   On: 04/21/2018 15:38   Dg Abd Portable 1v  Result Date: 04/30/2018 CLINICAL DATA:  NG tube placement EXAM: PORTABLE ABDOMEN - 1 VIEW COMPARISON:  04/30/2018 FINDINGS: Esophageal tube tip has been advanced, somewhat abrupt turn to the left distally. Consolidation at the left base. Right-sided ureteral stent. Mild diffuse increased small and large bowel gas. IMPRESSION: 1. Esophageal tube tip with slightly abrupt turn to the left distally, not sure if this represents kinking of the tube at the gastric outlet or projection of the tube over the proximal duodenum. 2. Consolidation at the left base. 3. Mild diffuse increased bowel gas, possible ileus Electronically Signed   By: Donavan Foil M.D.   On: 04/30/2018 16:45   Dg Abd Portable 1v  Result Date: 04/30/2018 CLINICAL DATA:  NG tube placement EXAM: PORTABLE ABDOMEN - 1 VIEW COMPARISON:  CT 04/25/2018 FINDINGS: Left basilar consolidation. Esophageal tube tip projects over GE junction. Mild diffuse gaseous enlargement of the bowel. IMPRESSION: Esophageal tube tip overlies the GE junction. Consolidation at the left lung base. Mild diffuse increased bowel gas throughout, possible ileus Electronically Signed   By: Donavan Foil M.D.   On: 04/30/2018 16:44    Consults: Treatment Team:  Hollice Espy, MD   Subjective:    Overnight Issues: Patient remains confused/encephalopathic.  Presently on Precedex infusion. Being followed by urology, appreciate input and assistance Also noted patient having some discoloration at the tips of both  hands and feet.  Patient was on pressors at one-point.  Has documented adequate pulses at this time  Objective:  Vital signs for last 24 hours: Temp:  [98.1 F (36.7 C)-98.7 F (37.1 C)] 98.1 F (36.7 C) (10/05 0200) Pulse Rate:  [51-106] 55 (10/05 0700) Resp:  [10-29] 10 (10/05 0700) BP: (85-170)/(55-83) 140/67 (10/05 0700) SpO2:  [95 %-100 %] 100 % (10/05 0700) Weight:  [76 kg] 76 kg (10/05 0256)  Hemodynamic parameters for last 24 hours:    Intake/Output from previous day: 10/04 0701 - 10/05 0700 In: 1647.3 [I.V.:1429.6; IV Piggyback:217.7] Out: 825 [Urine:825]  Intake/Output this shift: No intake/output data recorded.  Vent settings for last 24 hours:    Physical Exam:  GENERAL:awake, alert, confuse HEAD: Normocephalic, atraumatic.  EYES: Pupils equal, round, reactive to light.  No scleral icterus.  MOUTH: Moist mucosal membrane. NECK: Supple. No thyromegaly. No nodules. No JVD.  PULMONARY: +rhonchi, +wheezing CARDIOVASCULAR:  S1 and S2. Regular rate and rhythm. No murmurs, rubs, or gallops.  GASTROINTESTINAL: Soft, nontender, -distended. No masses. Positive bowel sounds. No hepatosplenomegaly.  MUSCULOSKELETAL: Patient's hands and feet have distally some areas of ischemia.  Adequate documented pulses both radials and dorsalis pedis NEUROLOGIC: Sponsored but confused   Assessment/Plan:   57 year old gentleman, status post septic shock secondary to acute hydronephrosis, status post extubation, encephalopathic being weaned off of Precedex.  Patient with high-grade urothelial carcinoma, being followed by urology, etiology of obstruction/hydronephrosis, Foley catheter in place, positive urinary output.  Prerenal indices with BUN 44 /creatinine 1.17  Encephalopathy.  MRI performed.  Revealed small bilateral subdural hematomas, along with encephalomalacia  Anemia with no evidence of active bleeding  Digital ischemia.  Patient has some areas of ischemia on tips on toes  and fingers, has had pressors during hypotensive episodes.  Does also have a history of hepatitis C, will send off cryoglobulins, complements, CRP/sed rate to make sure not vasculitis    Nilaya Bouie 05/01/2018  *Care during the described time interval was provided by me and/or other providers on the critical care team.  I have reviewed this patient's available data, including medical history, events of note, physical examination and test results as part of my evaluation. Patient ID: Altin Sease, male   DOB: June 01, 1961, 57 y.o.   MRN: [id number]

## 2018-05-02 DIAGNOSIS — R4182 Altered mental status, unspecified: Secondary | ICD-10-CM

## 2018-05-02 LAB — PROCALCITONIN: Procalcitonin: 0.23 ng/mL

## 2018-05-02 MED ORDER — PANTOPRAZOLE SODIUM 40 MG PO PACK
40.0000 mg | PACK | Freq: Every day | ORAL | Status: DC
  Administered 2018-05-03 – 2018-05-06 (×4): 40 mg via ORAL
  Filled 2018-05-02 (×5): qty 20

## 2018-05-02 MED ORDER — ESCITALOPRAM OXALATE 10 MG PO TABS
20.0000 mg | ORAL_TABLET | Freq: Every day | ORAL | Status: DC
  Administered 2018-05-03 – 2018-05-07 (×5): 20 mg via ORAL
  Filled 2018-05-02 (×5): qty 2

## 2018-05-02 MED ORDER — GABAPENTIN 250 MG/5ML PO SOLN
100.0000 mg | Freq: Three times a day (TID) | ORAL | Status: DC
  Administered 2018-05-03 – 2018-05-07 (×12): 100 mg via ORAL
  Filled 2018-05-02 (×17): qty 2

## 2018-05-02 MED ORDER — QUETIAPINE FUMARATE 25 MG PO TABS
50.0000 mg | ORAL_TABLET | Freq: Every day | ORAL | Status: DC
  Administered 2018-05-03 – 2018-05-06 (×4): 50 mg via ORAL
  Filled 2018-05-02 (×4): qty 2

## 2018-05-02 NOTE — Consult Note (Signed)
Reason for Consult:AMS Referring Physician: Conforti  CC: AMS  HPI: Chad [name] is an 57 y.o. male here visiting family who was found down in yard and brought in by EMS on 9/29.  Patient had previously been SOB and having increasing confusion for the prior week.  Patient required intubation.  Was found to have hydronephrosis and be in septic shock.  Patient had elevated white blood cell count, was hypernatremic and showed impaired renal function.  Patient received a stent for his hydronephrosis and has since been found to have renal carcinoma.  Failed weaning on 10/1 but is now extubated.  Mental status remains an issue.  Precedex being weaned.   All history obtained from the chart.  Patient unable to provide and family currently not available.    Past Medical History:  Diagnosis Date  . Anxiety   . Cancer [facility])    prostate  . Depression   . Hyperlipidemia   . Nerve pain   . Stroke [facility])     Past Surgical History:  Procedure Laterality Date  . COLON SURGERY     En bloc extended right hemicolectomy 07/2017  . CYSTOSCOPY WITH STENT PLACEMENT Right 04/25/2018   Procedure: CYSTOSCOPY WITH STENT PLACEMENT;  Surgeon: Hollice Espy, MD;  Location: [facility] ORS;  Service: Urology;  Laterality: Right;  . tumor removed       Family History  Problem Relation Age of Onset  . Prostate cancer Father     Social History:  reports that he has been smoking cigarettes. He has been smoking about 1.00 pack per day. He has never used smokeless tobacco. He reports that he does not drink alcohol or use drugs.  Allergies  Allergen Reactions  . Penicillins Rash    Medications:  I have reviewed the patient's current medications. Prior to Admission:  Medications Prior to Admission  Medication Sig Dispense Refill Last Dose  . atorvastatin (LIPITOR) 80 MG tablet Take 1 tablet (80 mg total) by mouth daily. 90 tablet 3 Unknown at Unknown  . escitalopram (LEXAPRO) 20 MG tablet Take 1 tablet (20 mg  total) by mouth daily. 90 tablet 3 Unknown at Unknown  . gabapentin (NEURONTIN) 100 MG capsule Take one to 2 tabs for pain 3 x day 180 capsule 3 Unknown at Unknown  . ibuprofen (ADVIL,MOTRIN) 200 MG tablet Take 200 mg by mouth every 6 (six) hours as needed.   Unknown at PRN  . traMADol (ULTRAM) 50 MG tablet Take 1 tablet (50 mg total) by mouth every 6 (six) hours as needed. 20 tablet 0 Unknown at PRN  . furosemide (LASIX) 20 MG tablet Take 20 mg by mouth.   Taking  . QUEtiapine (SEROQUEL) 50 MG tablet Take 1 tablet (50 mg total) by mouth at bedtime. 90 tablet 3    Scheduled: . chlorhexidine gluconate (MEDLINE KIT)  15 mL Mouth Rinse BID  . escitalopram  20 mg Per Tube Daily  . feeding supplement (PRO-STAT SUGAR FREE 64)  30 mL Per Tube BID  . folic acid  1 mg Intravenous Daily  . gabapentin  100 mg Per Tube Q8H  . pantoprazole sodium  40 mg Per Tube Q1200  . QUEtiapine  50 mg Per Tube QHS  . thiamine injection  100 mg Intravenous Daily    ROS: History obtained from chart review  General ROS: negative for - chills, fatigue, fever, night sweats, weight gain or weight loss Psychological ROS: as noted in HPI Ophthalmic ROS: negative for - blurry vision, double  vision, eye pain or loss of vision ENT ROS: negative for - epistaxis, nasal discharge, oral lesions, sore throat, tinnitus or vertigo Allergy and Immunology ROS: negative for - hives or itchy/watery eyes Hematological and Lymphatic ROS: negative for - bleeding problems, bruising or swollen lymph nodes Endocrine ROS: negative for - galactorrhea, hair pattern changes, polydipsia/polyuria or temperature intolerance Respiratory ROS: as noted in HPI Cardiovascular ROS: negative for - chest pain, dyspnea on exertion, edema or irregular heartbeat Gastrointestinal ROS: negative for - abdominal pain, diarrhea, hematemesis, nausea/vomiting or stool incontinence Genito-Urinary ROS: negative for - dysuria, hematuria, incontinence or urinary  frequency/urgency Musculoskeletal ROS: negative for - joint swelling or muscular weakness Neurological ROS: as noted in HPI Dermatological ROS: negative for rash and skin lesion changes  Physical Examination: Blood pressure 133/71, pulse (!) 48, temperature (!) 97.4 F (36.3 C), temperature source Axillary, resp. rate 12, height 6' (1.829 m), weight 76 kg, SpO2 98 %.  HEENT-  Normocephalic, no lesions, without obvious abnormality.  Normal external eye and conjunctiva.  Normal TM's bilaterally.  Normal auditory canals and external ears. Normal external nose, mucus membranes and septum.  Normal pharynx. Cardiovascular- S1, S2 normal, pulses palpable throughout   Lungs- chest clear, no wheezing, rales, normal symmetric air entry Abdomen- soft, non-tender; bowel sounds normal; no masses,  no organomegaly Extremities- no edema Lymph-no adenopathy palpable Musculoskeletal-no joint tenderness, deformity or swelling Skin-jaundice  Neurological Examination   Mental Status: Alert.  Voice is very soft and unable to be understood often.  When understood patient is fluent.  Can follow commands.  Does not appear to know where he is.   Cranial Nerves: II: Discs flat bilaterally; Blinks to bilateral confrontation, pupils equal, round, reactive to light and accommodation III,IV, VI: ptosis not present, extra-ocular motions intact bilaterally V,VII: left facial droopc, facial light touch sensation normal bilaterally VIII: hearing normal bilaterally IX,X: gag reflex reduced XI: bilateral shoulder shrug XII: midline tongue extension Motor: Able to lift RUE against gravity and maintain.  LUE at 0/5.  Able to flex both lower extremities at the knees and wiggle the toes but does not lift the legs off the bed.   Sensory: Pinprick and light touch intact throughout, bilaterally Deep Tendon Reflexes: 2+ and symmetric with absent AJ;s bilaterally Plantars: Equivocal bilaterally Cerebellar: Too weak to  perform Gait: not tested due to safety concerns    Laboratory Studies:   Basic Metabolic Panel: Recent Labs  Lab 04/25/18 2125  04/27/18 0417 04/28/18 0430 04/29/18 0337 04/30/18 0457 05/01/18 0547  NA  --    < > 142 145 151* 148* 147*  K  --    < > 3.1* 3.9 3.9 3.8 3.8  CL  --    < > 105 109 114* 114* 114*  CO2  --    < > '28 29 29 28 27  ' GLUCOSE  --    < > 106* 122* 112* 111* 120*  BUN  --    < > 81* 67* 52* 48* 44*  CREATININE  --    < > 2.74* 1.78* 1.38* 1.16 1.17  CALCIUM  --    < > 6.8* 7.3* 7.7* 7.6* 7.5*  MG 3.0*  --  2.9*  --   --   --   --   PHOS 7.1*  --  3.5  --   --   --  2.7   < > = values in this interval not displayed.    Liver Function Tests: Recent Labs  Lab 04/26/18 0540  AST 509*  ALT 157*  ALKPHOS 69  BILITOT 0.8  PROT 5.5*  ALBUMIN 2.3*   No results for input(s): LIPASE, AMYLASE in the last 168 hours. No results for input(s): AMMONIA in the last 168 hours.  CBC: Recent Labs  Lab 04/26/18 1041 04/28/18 0430 04/29/18 0337 04/30/18 0457 05/01/18 0547  WBC 7.9 10.2 9.7 10.1 9.0  NEUTROABS  --   --   --   --  7.3*  HGB 7.6* 8.6* 9.0* 10.5* 9.4*  HCT 22.9* 26.4* 27.3* 32.4* 30.8*  MCV 63.9* 64.9* 64.8* 65.7* 65.4*  PLT 200 166 166 177 191    Cardiac Enzymes: Recent Labs  Lab 04/25/18 2031 04/26/18 0540 04/27/18 0417  CKTOTAL 13,072* 7,916* 3,618*  CKMB  --   --  38.6*    BNP: Invalid input(s): POCBNP  CBG: Recent Labs  Lab 04/30/18 2001 04/30/18 2344 05/01/18 0352 05/01/18 0744 05/01/18 1147  GLUCAP 94 105* 108* 110* 51    Microbiology: Results for orders placed or performed during the hospital encounter of 04/25/18  Culture, blood (routine x 2)     Status: None   Collection Time: 04/25/18  9:43 AM  Result Value Ref Range Status   Specimen Description BLOOD RIGHT ARM  Final   Special Requests   Final    BOTTLES DRAWN AEROBIC AND ANAEROBIC Blood Culture results may not be optimal due to an inadequate volume of blood  received in culture bottles   Culture   Final    NO GROWTH 5 DAYS Performed at [facility], [address] [place], [place] [address]    Report Status 04/30/2018 FINAL  Final  Culture, blood (routine x 2)     Status: None   Collection Time: 04/25/18  9:43 AM  Result Value Ref Range Status   Specimen Description BLOOD LEFT ARM  Final   Special Requests   Final    BOTTLES DRAWN AEROBIC AND ANAEROBIC Blood Culture results may not be optimal due to an inadequate volume of blood received in culture bottles   Culture   Final    NO GROWTH 5 DAYS Performed at [facility], [address], [place], [place] [address]    Report Status 04/30/2018 FINAL  Final  Urine culture     Status: None   Collection Time: 04/25/18  9:43 AM  Result Value Ref Range Status   Specimen Description   Final    URINE, CATHETERIZED Performed at [facility], [address], [place], [place] [address]    Special Requests   Final    NONE Performed at [facility], [address] [place], [place] [address]    Culture   Final    NO GROWTH Performed at [facility], [place] [address] [place], [place] [address]    Report Status 04/26/2018 FINAL  Final  MRSA PCR Screening     Status: None   Collection Time: 04/25/18 11:58 AM  Result Value Ref Range Status   MRSA by PCR NEGATIVE NEGATIVE Final    Comment:        The GeneXpert MRSA Assay (FDA approved for NASAL specimens only), is one component of a comprehensive MRSA colonization surveillance program. It is not intended to diagnose MRSA infection nor to guide or monitor treatment for MRSA infections. Performed at [facility], [address], [place], [place] [address]   Urine Culture     Status: None   Collection Time:  04/25/18  6:13 PM  Result Value Ref Range Status   Specimen Description   Final    URINE, RANDOM Performed at [facility], [address] [place], [place] [address]     Special Requests   Final    NONE Performed at [facility], [address] [place], [place] [address]    Culture   Final    NO GROWTH Performed at [facility], [place] [address] [place],  [address]    Report Status 04/27/2018 FINAL  Final    Coagulation Studies: Recent Labs    05/01/18 0547  LABPROT 16.1*  INR 1.30    Urinalysis:  Recent Labs  Lab 04/25/18 1812  COLORURINE RED*  LABSPEC 1.017  PHURINE TEST NOT REPORTED DUE TO COLOR INTERFERENCE OF URINE PIGMENT  GLUCOSEU TEST NOT REPORTED DUE TO COLOR INTERFERENCE OF URINE PIGMENT*  HGBUR TEST NOT REPORTED DUE TO COLOR INTERFERENCE OF URINE PIGMENT*  BILIRUBINUR TEST NOT REPORTED DUE TO COLOR INTERFERENCE OF URINE PIGMENT*  KETONESUR TEST NOT REPORTED DUE TO COLOR INTERFERENCE OF URINE PIGMENT*  PROTEINUR TEST NOT REPORTED DUE TO COLOR INTERFERENCE OF URINE PIGMENT*  NITRITE TEST NOT REPORTED DUE TO COLOR INTERFERENCE OF URINE PIGMENT*  LEUKOCYTESUR TEST NOT REPORTED DUE TO COLOR INTERFERENCE OF URINE PIGMENT*    Lipid Panel:     Component Value Date/Time   TRIG 56 04/28/2018 1105    HgbA1C: No results found for: HGBA1C  Urine Drug Screen:      Component Value Date/Time   LABOPIA NONE DETECTED 04/25/2018 0831   COCAINSCRNUR NONE DETECTED 04/25/2018 0831   LABBENZ NONE DETECTED 04/25/2018 0831   AMPHETMU NONE DETECTED 04/25/2018 0831   THCU NONE DETECTED 04/25/2018 0831   LABBARB NONE DETECTED 04/25/2018 0831    Alcohol Level: No results for input(s): ETH in the last 168 hours.   Imaging: Dg Abd Portable 1v  Result Date: 04/30/2018 CLINICAL DATA:  NG tube placement EXAM: PORTABLE ABDOMEN - 1 VIEW COMPARISON:  04/30/2018 FINDINGS: Esophageal tube tip has been advanced, somewhat abrupt turn to the left distally. Consolidation at the left base. Right-sided ureteral stent. Mild diffuse increased small and large bowel gas. IMPRESSION: 1. Esophageal tube tip with slightly abrupt turn to the  left distally, not sure if this represents kinking of the tube at the gastric outlet or projection of the tube over the proximal duodenum. 2. Consolidation at the left base. 3. Mild diffuse increased bowel gas, possible ileus Electronically Signed   By: Donavan Foil M.D.   On: 04/30/2018 16:45   Dg Abd Portable 1v  Result Date: 04/30/2018 CLINICAL DATA:  NG tube placement EXAM: PORTABLE ABDOMEN - 1 VIEW COMPARISON:  CT 04/25/2018 FINDINGS: Left basilar consolidation. Esophageal tube tip projects over GE junction. Mild diffuse gaseous enlargement of the bowel. IMPRESSION: Esophageal tube tip overlies the GE junction. Consolidation at the left lung base. Mild diffuse increased bowel gas throughout, possible ileus Electronically Signed   By: Donavan Foil M.D.   On: 04/30/2018 16:44     Assessment/Plan: 57 year old male found down brought by EMS.  Found to be in respiratory distress, in septic shock with hydronephrosis and with multiple metabolic abnormalities.  Required intubation and ureteral stent placement.  Patient has been altered.  Now extubated and with decreasing sedation.  Mental status improved but patient remains difficult to examine fully due to his limitations in communication.  He is cooperative though.  Metabolic abnormalities are improving.  MRI of  the brain reviewed and shows small bilateral hematomas (possibly related to being down outside), chronic left frontal gyrus and right operculum encephalomalacia with superficial siderosis along the right hemisphere, right operculum and right parietal lobe.  Evidence of chronic microhemorrhages noted as well.   These findings likely explain th patient's focal findings on neurological examination that the patient reports are chronic.  These findings also make the patient more susceptible to encephalopathies and may prolong his recovery cognitively.   At this point with the patient improving would not recommend any further neurological testing.  If  he does not further improve or appears to worsen would consider EEG and ammonia level.    Alexis Goodell, MD Neurology [phone] 05/02/2018, 12:41 PM

## 2018-05-02 NOTE — Progress Notes (Signed)
[place] [facility] [place] NAME: Chad [name]    MR#:  [id number]  DATE OF BIRTH:  November 06, 1960  SUBJECTIVE:  CHIEF COMPLAINT:   Chief Complaint  Patient presents with  . Altered Mental Status  . Respiratory Distress   Brought with altered mental status and found to have sepsis, right-sided severe hydronephrosis and respiratory failure.  Intubated on admission, extubated on 04/29/18  Procedures Intubation- 04/25/18 Right-sided ureteral stent 04/25/18 Extubation 04/29/18 MRI brain 04/29/18  Still confused. No family at bedside  REVIEW OF SYSTEMS:   confused  ROS  DRUG ALLERGIES:   Allergies  Allergen Reactions  . Penicillins Rash    VITALS:  Blood pressure (!) 145/72, pulse (!) 47, temperature (!) 97.4 F (36.3 C), temperature source Axillary, resp. rate 14, height 6' (1.829 m), weight 76 kg, SpO2 97 %.  PHYSICAL EXAMINATION:   GENERAL:  57 y.o.-year-old patient lying in the bed . confused EYES: Pupils equal, round, reactive to light . No scleral icterus.  HEENT: Head atraumatic, normocephalic. Oropharynx and nasopharynx clear.  NECK:  Supple, no jugular venous distention. No thyroid enlargement, no tenderness.  LUNGS: Normal breath sounds bilaterally, no wheezing, rales,rhonchi or crepitation. No use of accessory muscles of respiration.   CARDIOVASCULAR: S1, S2 normal. No murmurs, rubs, or gallops.  ABDOMEN: Soft, nontender, nondistended. Bowel sounds present. No organomegaly or mass.  Surgical scar present. EXTREMITIES: No pedal edema, cyanosis, or clubbing.  NEUROLOGIC: extubated, but confused and non communicative, moves limbs in confusion. PSYCHIATRIC: The patient is confused.  SKIN: No obvious rash, lesion, or ulcer.   Physical Exam LABORATORY PANEL:   CBC Recent Labs  Lab 05/01/18 0547  WBC 9.0  HGB 9.4*  HCT 30.8*  PLT 191    ------------------------------------------------------------------------------------------------------------------  Chemistries  Recent Labs  Lab 04/26/18 0540 04/27/18 0417  05/01/18 0547  NA 136 142   < > 147*  K 3.2* 3.1*   < > 3.8  CL 108 105   < > 114*  CO2 20* 28   < > 27  GLUCOSE 148* 106*   < > 120*  BUN 92* 81*   < > 44*  CREATININE 3.04* 2.74*   < > 1.17  CALCIUM 6.7* 6.8*   < > 7.5*  MG  --  2.9*  --   --   AST 509*  --   --   --   ALT 157*  --   --   --   ALKPHOS 69  --   --   --   BILITOT 0.8  --   --   --    < > = values in this interval not displayed.   ------------------------------------------------------------------------------------------------------------------  Cardiac Enzymes No results for input(s): TROPONINI in the last 168 hours. ------------------------------------------------------------------------------------------------------------------  RADIOLOGY:  Dg Abd Portable 1v  Result Date: 04/30/2018 CLINICAL DATA:  NG tube placement EXAM: PORTABLE ABDOMEN - 1 VIEW COMPARISON:  04/30/2018 FINDINGS: Esophageal tube tip has been advanced, somewhat abrupt turn to the left distally. Consolidation at the left base. Right-sided ureteral stent. Mild diffuse increased small and large bowel gas. IMPRESSION: 1. Esophageal tube tip with slightly abrupt turn to the left distally, not sure if this represents kinking of the tube at the gastric outlet or projection of the tube over the proximal duodenum. 2. Consolidation at the left base. 3. Mild diffuse increased bowel gas, possible ileus Electronically Signed   By: Donavan Foil M.D.   On: 04/30/2018 16:45  Dg Abd Portable 1v  Result Date: 04/30/2018 CLINICAL DATA:  NG tube placement EXAM: PORTABLE ABDOMEN - 1 VIEW COMPARISON:  CT 04/25/2018 FINDINGS: Left basilar consolidation. Esophageal tube tip projects over GE junction. Mild diffuse gaseous enlargement of the bowel. IMPRESSION: Esophageal tube tip overlies the  GE junction. Consolidation at the left lung base. Mild diffuse increased bowel gas throughout, possible ileus Electronically Signed   By: Donavan Foil M.D.   On: 04/30/2018 16:44    ASSESSMENT AND PLAN:   Principal Problem:   Severe sepsis ([place]) Active Problems:   Sepsis ([place])   Protein-calorie malnutrition, severe  *Septic shock secondary to UTI.  Cultures negative.  On IV antibiotics.  *Right hydronephrosis.  Status post right ureteral stent. Pathology shows high-grade urothelial carcinoma  *Acute hypoxic respiratory failure secondary to septic shock.  Extubated.  *Acute metabolic encephalopathy.  Multifactorial.  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  *Elevated LFTs Likely due to septic shock, continue to monitor. Patient with chronic hepatitis C. Proving  *Microcytic anemia She needs outpatient transfusions frequently.  Continue to transfuse to keep hemoglobin greater than 7  *Acute kidney injury Secondary to right hydronephrosis and septic shock. Improved  * Rhabdomyolysis Solved  *COPD and chronic smoking Currently no exacerbation symptoms, may continue bronchodilators.   All the records are reviewed and case discussed with Care Management/Social Workerr. Management plans discussed with the patient, family and they are in agreement.  CODE STATUS: Full.  TOTAL TIME TAKING CARE OF THIS PATIENT: 35 minutes.   POSSIBLE D/C IN 1-2 DAYS, DEPENDING ON CLINICAL CONDITION.  Neita Carp M.D on 05/02/2018   Between 7am to 6pm - Pager - [phone]  After 6pm go to [email or web address] - password [email or web address] [facility]  Office  [phone]  CC: Primary care physician; [name], MD  Note: This dictation was prepared with Dragon dictation along with smaller phrase technology. Any transcriptional errors that result from this process are unintentional.

## 2018-05-02 NOTE — Progress Notes (Signed)
Patient ID: Chad [name], male   DOB: 23-Dec-1960, 57 y.o.   MRN: [id number] Pulmonary/critical care  Discussion with family.  Family states he is not too far away from his baseline mental status wise Also relate that he would not be able to take care of himself anymore and will need skilled care.  Will place consult for case management, also will need to discuss further work-up evaluation and treatment of high-grade urothelial carcinoma   Hermelinda Dellen, DO

## 2018-05-02 NOTE — Progress Notes (Signed)
[facility] NAME: Chad [name]    MR#:  [id number]  DATE OF BIRTH:  [birth year]  SUBJECTIVE:  CHIEF COMPLAINT:   Chief Complaint  Patient presents with  . Altered Mental Status  . Respiratory Distress   Brought with altered mental status and found to have sepsis, right-sided severe hydronephrosis and respiratory failure.  Intubated on admission, extubated on 04/29/18  Procedures Intubation- 04/25/18 Right-sided ureteral stent 04/25/18 Extubation 04/29/18 MRI brain 04/29/18  Patient continues to be confused.  On Precedex drip.  REVIEW OF SYSTEMS:   confused  ROS  DRUG ALLERGIES:   Allergies  Allergen Reactions  . Penicillins Rash    VITALS:  Blood pressure (!) 145/72, pulse (!) 47, temperature (!) 97.4 F (36.3 C), temperature source Axillary, resp. rate 14, height 6' (1.829 m), weight 76 kg, SpO2 97 %.  PHYSICAL EXAMINATION:   GENERAL:  57 y.o.-year-old patient lying in the bed . confused EYES: Pupils equal, round, reactive to light . No scleral icterus.  HEENT: Head atraumatic, normocephalic. Oropharynx and nasopharynx clear.  NECK:  Supple, no jugular venous distention. No thyroid enlargement, no tenderness.  LUNGS: Normal breath sounds bilaterally, no wheezing, rales,rhonchi or crepitation. No use of accessory muscles of respiration.   CARDIOVASCULAR: S1, S2 normal. No murmurs, rubs, or gallops.  ABDOMEN: Soft, nontender, nondistended. Bowel sounds present. No organomegaly or mass.  Surgical scar present. EXTREMITIES: No pedal edema, cyanosis, or clubbing.  NEUROLOGIC: extubated, but confused and non communicative, moves limbs in confusion. PSYCHIATRIC: The patient is confused.  SKIN: No obvious rash, lesion, or ulcer.   Physical Exam LABORATORY PANEL:   CBC Recent Labs  Lab 05/01/18 0547  WBC 9.0  HGB 9.4*  HCT 30.8*  PLT 191    ------------------------------------------------------------------------------------------------------------------  Chemistries  Recent Labs  Lab 04/26/18 0540 04/27/18 0417  05/01/18 0547  NA 136 142   < > 147*  K 3.2* 3.1*   < > 3.8  CL 108 105   < > 114*  CO2 20* 28   < > 27  GLUCOSE 148* 106*   < > 120*  BUN 92* 81*   < > 44*  CREATININE 3.04* 2.74*   < > 1.17  CALCIUM 6.7* 6.8*   < > 7.5*  MG  --  2.9*  --   --   AST 509*  --   --   --   ALT 157*  --   --   --   ALKPHOS 69  --   --   --   BILITOT 0.8  --   --   --    < > = values in this interval not displayed.   ------------------------------------------------------------------------------------------------------------------  Cardiac Enzymes No results for input(s): TROPONINI in the last 168 hours. ------------------------------------------------------------------------------------------------------------------  RADIOLOGY:  Dg Abd Portable 1v  Result Date: 04/30/2018 CLINICAL DATA:  NG tube placement EXAM: PORTABLE ABDOMEN - 1 VIEW COMPARISON:  04/30/2018 FINDINGS: Esophageal tube tip has been advanced, somewhat abrupt turn to the left distally. Consolidation at the left base. Right-sided ureteral stent. Mild diffuse increased small and large bowel gas. IMPRESSION: 1. Esophageal tube tip with slightly abrupt turn to the left distally, not sure if this represents kinking of the tube at the gastric outlet or projection of the tube over the proximal duodenum. 2. Consolidation at the left base. 3. Mild diffuse increased bowel gas, possible ileus Electronically Signed   By: Donavan Foil M.D.   On:  04/30/2018 16:45   Dg Abd Portable 1v  Result Date: 04/30/2018 CLINICAL DATA:  NG tube placement EXAM: PORTABLE ABDOMEN - 1 VIEW COMPARISON:  CT 04/25/2018 FINDINGS: Left basilar consolidation. Esophageal tube tip projects over GE junction. Mild diffuse gaseous enlargement of the bowel. IMPRESSION: Esophageal tube tip overlies the  GE junction. Consolidation at the left lung base. Mild diffuse increased bowel gas throughout, possible ileus Electronically Signed   By: Donavan Foil M.D.   On: 04/30/2018 16:44    ASSESSMENT AND PLAN:   Principal Problem:   Severe sepsis ([place]) Active Problems:   Sepsis ([place])   Protein-calorie malnutrition, severe  *Septic shock secondary to UTI.  Cultures negative.   Cefepime  *Right hydronephrosis.  Status post right ureteral stent. Pathology shows high-grade urothelial carcinoma  *Acute hypoxic respiratory failure secondary to septic shock.  Extubated.  *Acute metabolic encephalopathy.  Multifactorial.  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  *Elevated LFTs Likely due to septic shock, continue to monitor. Patient with chronic hepatitis C. Proving  *Microcytic anemia She needs outpatient transfusions frequently.  Continue to transfuse to keep hemoglobin greater than 7  *Acute kidney injury Secondary to right hydronephrosis and septic shock. Improved  * Rhabdomyolysis Solved  *COPD and chronic smoking Currently no exacerbation symptoms, may continue bronchodilators.   All the records are reviewed and case discussed with Care Management/Social Workerr. Management plans discussed with the patient, family and they are in agreement.  CODE STATUS: Full.  TOTAL TIME TAKING CARE OF THIS PATIENT: 35 minutes.   POSSIBLE D/C IN 1-2 DAYS, DEPENDING ON CLINICAL CONDITION.  Neita Carp M.D on 05/02/2018   Between 7am to 6pm - Pager - [phone]  After 6pm go to [email or web address] - password [email or web address] [facility]  Office  [phone]  CC: Primary care physician; [name], MD  Note: This dictation was prepared with Dragon dictation along with smaller phrase technology. Any transcriptional errors that result from this process are unintentional.

## 2018-05-02 NOTE — Progress Notes (Signed)
Follow up - Critical Care Medicine Note  Patient Details:    Chad [name] is an 57 y.o. male. Chad [name] is a 57 y.o. year old with history of colon cancer, Hep C who was found down by a neighbor brought in by EMS almost unresponsive and severely hypothermic, 61F.  He was intubated in the ICU and a noncontrast CT scan noted severe hydronephrosis without an obvious obstructing stone, transition point appears to be in the UPJ region.    Lines, Airways, Drains: Urethral Catheter Vonna Kotyk., RN Latex 16 Fr. (Active)  Indication for Insertion or Continuance of Catheter Acute urinary retention;Peri-operative use for selective surgical procedure 04/30/2018  8:00 AM  Site Assessment Clean;Intact;Dry 04/30/2018  8:00 AM  Catheter Maintenance Bag below level of bladder;Catheter secured;Drainage bag/tubing not touching floor;Insertion date on drainage bag;No dependent loops;Seal intact 04/30/2018  7:30 AM  Collection Container Standard drainage bag 04/30/2018  8:00 AM  Securement Method Securing device (Describe) 04/30/2018  8:00 AM  Urinary Catheter Interventions Unclamped 04/30/2018  8:00 AM     Ureteral Drain/Stent Right ureter 6 Fr. (Active)  Site Assessment Other (Comment) 04/30/2018  8:00 AM  Output (mL) 260 mL 04/28/2018  8:40 AM    Anti-infectives:  Anti-infectives (From admission, onward)   Start     Dose/Rate Route Frequency Ordered Stop   04/30/18 1000  ceFEPIme (MAXIPIME) 2 g in sodium chloride 0.9 % 100 mL IVPB     2 g 200 mL/hr over 30 Minutes Intravenous Every 12 hours 04/30/18 0928 05/03/18 0959   04/26/18 1000  ceFEPIme (MAXIPIME) 2 g in sodium chloride 0.9 % 100 mL IVPB  Status:  Discontinued     2 g 200 mL/hr over 30 Minutes Intravenous Every 24 hours 04/26/18 0915 04/30/18 0928   04/26/18 0600  vancomycin (VANCOCIN) IVPB 1000 mg/200 mL premix  Status:  Discontinued     1,000 mg 200 mL/hr over 60 Minutes Intravenous Every 36 hours 04/25/18 1039 04/26/18 1028   04/25/18 1045   vancomycin (VANCOCIN) IVPB 1000 mg/200 mL premix     1,000 mg 200 mL/hr over 60 Minutes Intravenous  Once 04/25/18 1039 04/25/18 1600   04/25/18 1038  vancomycin variable dose per unstable renal function (pharmacist dosing)  Status:  Discontinued      Does not apply See admin instructions 04/25/18 1039 04/26/18 0957   04/25/18 0945  levofloxacin (LEVAQUIN) IVPB 750 mg  Status:  Discontinued     750 mg 100 mL/hr over 90 Minutes Intravenous  Once 04/25/18 0937 04/25/18 0937   04/25/18 0945  ceFEPIme (MAXIPIME) 1 g in sodium chloride 0.9 % 100 mL IVPB     1 g 200 mL/hr over 30 Minutes Intravenous  Once 04/25/18 9373 04/25/18 1038      Microbiology: Results for orders placed or performed during the hospital encounter of 04/25/18  Culture, blood (routine x 2)     Status: None   Collection Time: 04/25/18  9:43 AM  Result Value Ref Range Status   Specimen Description BLOOD RIGHT ARM  Final   Special Requests   Final    BOTTLES DRAWN AEROBIC AND ANAEROBIC Blood Culture results may not be optimal due to an inadequate volume of blood received in culture bottles   Culture   Final    NO GROWTH 5 DAYS Performed at [facility], [address] [place], [place] [address]    Report Status 04/30/2018 FINAL  Final  Culture, blood (routine x 2)     Status:  None   Collection Time: 04/25/18  9:43 AM  Result Value Ref Range Status   Specimen Description BLOOD LEFT ARM  Final   Special Requests   Final    BOTTLES DRAWN AEROBIC AND ANAEROBIC Blood Culture results may not be optimal due to an inadequate volume of blood received in culture bottles   Culture   Final    NO GROWTH 5 DAYS Performed at [facility], [address], [place], [place] [address]    Report Status 04/30/2018 FINAL  Final  Urine culture     Status: None   Collection Time: 04/25/18  9:43 AM  Result Value Ref Range Status   Specimen Description   Final    URINE, CATHETERIZED Performed at [facility], [address] [place] [address], [place], [place] [address]    Special Requests   Final    NONE Performed at [facility], [address], [place], [place] [address]    Culture   Final    NO GROWTH Performed at [facility], [place] [address] [place], [place] [address]    Report Status 04/26/2018 FINAL  Final  MRSA PCR Screening     Status: None   Collection Time: 04/25/18 11:58 AM  Result Value Ref Range Status   MRSA by PCR NEGATIVE NEGATIVE Final    Comment:        The GeneXpert MRSA Assay (FDA approved for NASAL specimens only), is one component of a comprehensive MRSA colonization surveillance program. It is not intended to diagnose MRSA infection nor to guide or monitor treatment for MRSA infections. Performed at [facility], [address], [place], [place] [address]   Urine Culture     Status: None   Collection Time: 04/25/18  6:13 PM  Result Value Ref Range Status   Specimen Description   Final    URINE, RANDOM Performed at [facility], [address], [place], [place] [address]    Special Requests   Final    NONE Performed at [facility], [address], [place], [place] [address]    Culture   Final    NO GROWTH Performed at [facility], [place] [address], [place],  [address]    Report Status 04/27/2018 FINAL  Final   Studies: Ct Abdomen Pelvis Wo Contrast  Result Date: 04/25/2018 CLINICAL DATA:  Elevated liver functions and elevated serum creatinine. Unresponsive. EXAM: CT ABDOMEN AND PELVIS WITHOUT CONTRAST TECHNIQUE: Multidetector CT imaging of the abdomen and pelvis was performed following the standard protocol without oral or IV contrast. COMPARISON:  None. FINDINGS: Lower chest: There is bibasilar atelectasis with small bilateral pleural effusions. No pneumothorax in the lung bases. There are foci of coronary artery calcification. Hepatobiliary: No focal liver lesions are appreciable on this noncontrast  enhanced study. Gallbladder is distended without appreciable wall thickening. There is no biliary duct dilatation. Pancreas: No evident pancreatic mass or inflammatory focus. Spleen: No splenic lesions are appreciable. Adrenals/Urinary Tract: Adrenals bilaterally appear normal. There is an apparent cyst arising from the upper pole of the right kidney measuring 2.5 x 2.5 cm. There is scarring in the upper pole of the right kidney. There is severe hydronephrosis on the right. There is a cyst in the lower pole of the left kidney measuring 1.2 x 1.2 cm. There is a probable hyperdense cyst arising from the upper pole left kidney measuring 4 x 4 mm. There is no appreciable hydronephrosis on the left. There is  a 3 mm calculus in the lower pole of the right kidney. There is no evident ureteral calculus on either side. Urinary bladder is decompressed with a Foley catheter. Air in the urinary bladder is likely due to recent instrumentation. Stomach/Bowel: Patient has had a previous hemicolectomy. Postoperative changes with bowel anastomosis site appearing patent. There is no appreciable bowel wall or mesenteric thickening. No evident bowel obstruction. No appreciable free air or portal venous air. Nasogastric tube tip is in the stomach. Vascular/Lymphatic: There are foci aortoiliac atherosclerosis. No aneurysm evident. Major mesenteric arterial vessels appear patent on this noncontrast enhanced study. There is no evident adenopathy in the abdomen or pelvis. Reproductive: There are small prostatic calculi. Prostate and seminal vesicles appear normal in size and contour. No pelvic mass is evident. Other: There is nonspecific stranding in the lateral abdominopelvic junction regions bilaterally. There is no associated mass or fluid. These may be postoperative changes. There is no appreciable abscess or ascites in the abdomen or pelvis. No appreciable omental lesions. Musculoskeletal: There is degenerative change in each hip  joint. No blastic or lytic bone lesions are identified. No evident fracture or dislocation. No intramuscular lesions are appreciable. No abdominal wall lesions are appreciable. IMPRESSION: 1. Postoperative changes with evidence of previous hemicolectomy on the right. Anastomosis site appears patent. No bowel obstruction. No abscess in the abdomen or pelvis evident. Nasogastric tube tip in stomach. 2. Severe hydronephrosis on the right without obstructing focus evident. Question ureteropelvic junction on the right given the overall appearance. This finding may warrant nonemergent nuclear medicine Lasix renogram to further assess. 3. Nonobstructing calculus lower pole right kidney measuring 3 mm. Urinary bladder decompressed with Foley catheter. Air in the bladder is likely secondary to recent instrumentation. Prostatic calcifications noted. 4. There is aortic and iliac artery atherosclerosis. There are foci of coronary artery calcification. Aortic Atherosclerosis (ICD10-I70.0). Electronically Signed   By: Lowella Grip III M.D.   On: 04/25/2018 09:57   Ct Head Wo Contrast  Result Date: 04/25/2018 CLINICAL DATA:  Unresponsive EXAM: CT HEAD WITHOUT CONTRAST TECHNIQUE: Contiguous axial images were obtained from the base of the skull through the vertex without intravenous contrast. COMPARISON:  None. FINDINGS: Brain: No evidence of acute infarction, hemorrhage, extra-axial collection, ventriculomegaly, or mass effect. Encephalomalacia of the left inferior frontal lobe. 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 Generalized cerebral atrophy. Periventricular white matter low attenuation likely secondary to microangiopathy. Vascular: Cerebrovascular atherosclerotic calcifications are noted. Skull: Negative for fracture or focal lesion. Sinuses/Orbits: Visualized portions of the orbits are unremarkable. The mastoid sinuses are clear. Bilateral maxillary sinus mucosal thickening. Air-fluid level in the  right sphenoid sinus. Other: None. IMPRESSION: 1. No acute intracranial pathology. 2. Chronic microvascular disease and cerebral atrophy. Electronically Signed   By: Kathreen Devoid   On: 04/25/2018 09:50   Mr Brain Wo Contrast  Addendum Date: 04/29/2018   ADDENDUM REPORT: 04/29/2018 16:13 ADDENDUM: Study discussed by telephone with Dr. Merton Border in the ICU on 04/29/2018 at 1607 hours. Electronically Signed   By: Genevie Ann M.D.   On: 04/29/2018 16:13   Result Date: 04/29/2018 CLINICAL DATA:  57 year old male found down outside. Suspected sepsis. Ataxia. EXAM: MRI HEAD WITHOUT CONTRAST TECHNIQUE: Multiplanar, multiecho pulse sequences of the brain and surrounding structures were obtained without intravenous contrast. COMPARISON:  Head CT without contrast 04/25/2018. FINDINGS: Brain: Chronic encephalomalacia in the left inferior frontal gyrus and right operculum. 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 Scattered small micro hemorrhages in both hemispheres, including concentrated at the splenium of the corpus callosum (image 33). Evidence of some Wallerian degeneration in the brainstem including at the midbrain (series 10, image 10) and at the right medullary pyramid (image 4). Additionally there are small bilateral subdural hematomas, slightly larger in the left hemisphere measuring up to 4 millimeters in thickness. These were not apparent on the recent CT. No midline shift. No significant intracranial mass effect. No restricted diffusion to suggest acute infarction. Scattered bilateral nonspecific cerebral white matter T2 and FLAIR hyperintensity superimposed on the encephalomalacia described earlier. No chronic blood products in the posterior fossa. No evidence of mass lesion, ventriculomegaly. Cervicomedullary junction and pituitary are within normal limits. Vascular: Major intracranial vascular  flow voids are preserved. Skull and upper cervical spine: Negative visible cervical spine. Nonspecific mildly decreased T1 bone marrow signal diffusely. No destructive osseous lesion identified. Sinuses/Orbits: Normal orbits soft tissues. Chronic paranasal sinus periosteal thickening plus mild mucosal thickening. The fluid level in the right sphenoid sinus has regressed. Other: Mild left mastoid effusion. Trace right mastoid fluid. Negative nasopharynx. Scalp and face soft tissues appear negative. IMPRESSION: 1. No acute infarct, but there are small bilateral Subdural Hematomas. That on the left is slightly larger measuring up to 4 millimeters in thickness. No associated intracranial mass effect. 2. Chronic encephalomalacia in the bilateral frontal lobes could be ischemic and/or posttraumatic. There are scattered chronic micro-hemorrhages in both hemispheres, including in the splenium of the corpus callosum which could be seen as the sequelae of trauma. There is mild superficial siderosis in the right hemisphere. Some Wallerian degeneration is evident in the right brainstem. 3. 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 Mild likely postinflammatory mastoid fluid. Electronically Signed: By: Genevie Ann M.D. On: 04/29/2018 15:19   US Renal  Result Date: 04/27/2018 CLINICAL DATA:  History of right hydronephrosis, follow-up EXAM: RENAL / URINARY TRACT ULTRASOUND COMPLETE COMPARISON:  CT abdomen pelvis of 04/25/2017 FINDINGS: Right Kidney: Length: 10.8 cm. Only slight fullness of the renal calices is seen. The right renal pelvis is not significantly dilated. This represents and improvement when compared to the CT images of 04/25/2017. The renal parenchyma is somewhat echogenic which may indicate chronic renal medical disease. Correlate with renal laboratory values. Left Kidney: Length: 12.8 cm.  No hydronephrosis is seen. Bladder: The urinary bladder is decompressed with Foley catheter in place. IMPRESSION: 1. Slight  fullness of the right renal calices is noted which represents and improvement compared to the CT images recently. 2. No left hydronephrosis is seen. 3. Somewhat echogenic right renal parenchyma. Correlate with renal laboratory values. Electronically Signed   By: Ivar Drape M.D.   On: 04/27/2018 17:04   Dg Chest Port 1 View  Result Date: 04/26/2018 CLINICAL DATA:  Respiratory failure EXAM: PORTABLE CHEST 1 VIEW COMPARISON:  04/25/2018 FINDINGS: Endotracheal tube and NG tube remain in place, unchanged. No confluent airspace opacities or effusions. No acute bony abnormality. Heart is normal size. IMPRESSION: No significant change.  No active disease. Electronically Signed   By: Rolm Baptise M.D.   On: 04/26/2018 07:57   Dg Chest Portable 1 View  Result Date: 04/25/2018 CLINICAL DATA:  Hypoxia EXAM: PORTABLE CHEST 1 VIEW COMPARISON:  None. FINDINGS: Endotracheal tube tip is 4.5 cm above the carina. Nasogastric tube tip and side port are in the stomach. No evident pneumothorax. There is a small left pleural effusion with left base atelectasis. Lungs elsewhere clear.  Heart size and pulmonary vascularity are normal. No adenopathy. There is aortic atherosclerosis. No evident bone lesions. IMPRESSION: Tube positions as described without pneumothorax. Small pleural effusion with left base atelectasis. Lungs elsewhere clear. Heart size normal. There is aortic atherosclerosis. Aortic Atherosclerosis (ICD10-I70.0). Electronically Signed   By: Lowella Grip III M.D.   On: 04/25/2018 08:28   Dg Knee Complete 4 Views Left  Result Date: 04/21/2018 CLINICAL DATA:  Left knee pain for 6 years. EXAM: LEFT KNEE - COMPLETE 4+ VIEW COMPARISON:  None. FINDINGS: Marked femorotibial joint space narrowing is identified greatest along the lateral femorotibial compartment where there is bone-on-bone apposition with subchondral sclerosis and cystic change noted across the joint. Minimal spurring is noted off the femoral condyles  and lateral tibial plateau. Small joint effusion is identified on the lateral view within the suprapatellar compartment. No fracture or bone destruction is identified. IMPRESSION: Marked femorotibial joint space narrowing of the left knee more so along the lateral aspect where there is bone-on-bone apposition. Small suprapatellar joint effusion. No acute osseous abnormality. Electronically Signed   By: Ashley Royalty M.D.   On: 04/21/2018 15:38   Dg Abd Portable 1v  Result Date: 04/30/2018 CLINICAL DATA:  NG tube placement EXAM: PORTABLE ABDOMEN - 1 VIEW COMPARISON:  04/30/2018 FINDINGS: Esophageal tube tip has been advanced, somewhat abrupt turn to the left distally. Consolidation at the left base. Right-sided ureteral stent. Mild diffuse increased small and large bowel gas. IMPRESSION: 1. Esophageal tube tip with slightly abrupt turn to the left distally, not sure if this represents kinking of the tube at the gastric outlet or projection of the tube over the proximal duodenum. 2. Consolidation at the left base. 3. Mild diffuse increased bowel gas, possible ileus Electronically Signed   By: Donavan Foil M.D.   On: 04/30/2018 16:45   Dg Abd Portable 1v  Result Date: 04/30/2018 CLINICAL DATA:  NG tube placement EXAM: PORTABLE ABDOMEN - 1 VIEW COMPARISON:  CT 04/25/2018 FINDINGS: Left basilar consolidation. Esophageal tube tip projects over GE junction. Mild diffuse gaseous enlargement of the bowel. IMPRESSION: Esophageal tube tip overlies the GE junction. Consolidation at the left lung base. Mild diffuse increased bowel gas throughout, possible ileus Electronically Signed   By: Donavan Foil M.D.   On: 04/30/2018 16:44    Consults: Treatment Team:  Hollice Espy, MD   Subjective:    Overnight Issues: Patient remains confused more responsive today..  Presently on Precedex infusion. Being followed by urology, appreciate input and assistance. Also noted patient having some discoloration at the tips of  both hands and feet.  Patient was on pressors at one-point.  Has documented adequate pulses at this time  Objective:  Vital signs for last 24 hours: Temp:  [98.2 F (36.8 C)-98.9 F (37.2 C)] 98.2 F (36.8 C) (10/06 0130) Pulse Rate:  [44-77] 48 (10/06 0600) Resp:  [3-17] 13 (10/06 0600) BP: (113-149)/(62-86) 126/74 (10/06 0600) SpO2:  [93 %-100 %] 99 % (10/06 0600)  Hemodynamic parameters for last 24 hours:    Intake/Output from previous day: 10/05 0701 - 10/06 0700 In: 1685.3 [I.V.:1496.7; IV Piggyback:188.6] Out: 1235 [Urine:1235]  Intake/Output this shift: Total I/O In: 130.1 [I.V.:130.1] Out: -   Vent settings for last 24 hours:    Physical Exam:  GENERAL:awake, alert, confuse HEAD: Normocephalic, atraumatic.  EYES: Pupils equal, round, reactive to light.  No scleral icterus.  MOUTH: Moist mucosal membrane. NECK: Supple. No thyromegaly. No nodules. No JVD.  PULMONARY: +rhonchi, +wheezing CARDIOVASCULAR: S1  and S2. Regular rate and rhythm. No murmurs, rubs, or gallops.  GASTROINTESTINAL: Soft, nontender, -distended. No masses. Positive bowel sounds. No hepatosplenomegaly.  MUSCULOSKELETAL: Patient's hands and feet have distally some areas of ischemia.  Adequate documented pulses both radials and dorsalis pedis NEUROLOGIC: Sponsored but confused   Assessment/Plan:   57 year old gentleman, status post septic shock secondary to acute hydronephrosis, status post extubation, encephalopathic being weaned off of Precedex, will use as needed benzodiazepine  Patient with high-grade urothelial carcinoma, being followed by urology, etiology of obstruction/hydronephrosis, Foley catheter in place, positive urinary output.  Prerenal indices with BUN 44 /creatinine 1.17  Encephalopathy.  MRI performed.  Revealed small bilateral subdural hematomas, along with encephalomalacia  Anemia with no evidence of active bleeding  Digital ischemia.  Patient has some areas of ischemia on  tips on toes and fingers, has had pressors during hypotensive episodes.  Does also have a history of hepatitis C, will send off cryoglobulins, complements, ANA, RF.  ESR was 45 and CRP was 9.7  Critical care time 30 minutes  Depaul Arizpe 05/02/2018  *Care during the described time interval was provided by me and/or other providers on the critical care team.  I have reviewed this patient's available data, including medical history, events of note, physical examination and test results as part of my evaluation. Patient ID: Chad [name], male   DOB: Mar 06, 1961, 57 y.o.   MRN: [id number] Patient ID: Chad [name], male   DOB: 20-Aug-1960, 57 y.o.   MRN: [id number]

## 2018-05-02 NOTE — Progress Notes (Signed)
Report called to Sinclair Grooms, patient transfer from ICU 7 to Room 151. Had prn morphine 1 mg and Ativan 1 mg at 2011 that was effective. Informed RN about loose teeth, and keeping patient NPO until sedation wears off, and can have a swallow study, before giving po medications, etc. Patient left floor with NT. Continue on CIWA-AR.  No concerns at this time.

## 2018-05-03 LAB — ANA: ANA: NEGATIVE

## 2018-05-03 LAB — PROCALCITONIN: Procalcitonin: 0.19 ng/mL

## 2018-05-03 MED ORDER — MORPHINE SULFATE (PF) 2 MG/ML IV SOLN: 1.0000 mg | Freq: Four times a day (QID) | INTRAVENOUS | Status: DC | PRN

## 2018-05-03 MED ORDER — FOLIC ACID 1 MG PO TABS
1.0000 mg | ORAL_TABLET | Freq: Every day | ORAL | Status: DC
  Administered 2018-05-04 – 2018-05-07 (×4): 1 mg via ORAL
  Filled 2018-05-03 (×4): qty 1

## 2018-05-03 MED ORDER — LORAZEPAM 2 MG/ML IJ SOLN: 0.5000 mg | Freq: Three times a day (TID) | INTRAMUSCULAR | Status: DC | PRN

## 2018-05-03 NOTE — Progress Notes (Signed)
Clinical Education officer, museum (CSW) contacted patient's son [name] and presented bed offers. Son chose [facility]. Per son he will talk to patient tonight after he gets off work about going to [facility]. [facility] admissions coordinator at [facility] is aware of accepted bed offer.   [employer], LCSW [phone]

## 2018-05-03 NOTE — Progress Notes (Signed)
Pharmacy Electrolyte Monitoring Consult:  Pharmacy consulted to assist in monitoring and replacing electrolytes in this 57 y.o. male admitted on 04/25/2018 with Altered Mental Status and Respiratory Distress   Labs:  Sodium (mmol/L)  Date Value  05/01/2018 147 (H)   Potassium (mmol/L)  Date Value  05/01/2018 3.8   Magnesium (mg/dL)  Date Value  04/27/2018 2.9 (H)   Phosphorus (mg/dL)  Date Value  05/01/2018 2.7   Calcium (mg/dL)  Date Value  05/01/2018 7.5 (L)   Albumin (g/dL)  Date Value  04/26/2018 2.3 (L)    Assessment/Plan: Pharmacy will sign off on electrolyte consult at this time. Please re-consult if desired  Chinita Greenland PharmD Clinical Pharmacist 05/03/2018

## 2018-05-03 NOTE — Progress Notes (Signed)
PMT consult received and chart reviewed. No family at bedside. PMT provider will contact son for [place] discussion.   NO CHARGE  Ihor Dow, FNP-C Palliative Medicine Team  Phone: [phone] Fax: [phone]

## 2018-05-03 NOTE — NC FL2 (Signed)
[place] LEVEL OF CARE SCREENING TOOL     IDENTIFICATION  Patient Name: Chad [name] Birthdate: January 07, 1961 Sex: male Admission Date (Current Location): 04/25/2018  [facility] and [place] Number:  Selena Lesser ([id number]) Facility and Address:  [facility], [address] [place], [place] [address]      Provider Number: [id number]  Attending Physician Name and Address:  Fritzi Mandes, MD  Relative Name and Phone Number:       Current Level of Care: Hospital Recommended Level of Care: [place] Prior Approval Number:    Date Approved/Denied:   PASRR Number:    Discharge Plan: SNF    Current Diagnoses: Patient Active Problem List   Diagnosis Date Noted  . Protein-calorie malnutrition, severe 04/26/2018  . Severe sepsis ([place]) 04/25/2018  . Sepsis ([place]) 04/25/2018    Orientation RESPIRATION BLADDER Height & Weight     Self, Place  Normal Incontinent Weight: 168 lb 10.4 oz (76.5 kg) Height:  6' (182.9 cm)  BEHAVIORAL SYMPTOMS/MOOD NEUROLOGICAL BOWEL NUTRITION STATUS      Incontinent Diet(Diet: DYS 1 )  AMBULATORY STATUS COMMUNICATION OF NEEDS Skin   Extensive Assist Verbally Normal                       Personal Care Assistance Level of Assistance  Bathing, Feeding, Dressing Bathing Assistance: Maximum assistance Feeding assistance: Maximum assistance Dressing Assistance: Maximum assistance     Functional Limitations Info  Sight, Hearing, Speech Sight Info: Adequate Hearing Info: Adequate Speech Info: Adequate    SPECIAL CARE FACTORS FREQUENCY  PT (By licensed PT)     PT Frequency: (5)              Contractures      Additional Factors Info  Code Status, Allergies Code Status Info: (Full Code. ) Allergies Info: (Penicillins. )           Current Medications (05/03/2018):  This is the current hospital active medication list Current Facility-Administered Medications  Medication Dose Route  Frequency Provider Last Rate Last Dose  . chlorhexidine gluconate (MEDLINE KIT) (PERIDEX) 0.12 % solution 15 mL  15 mL Mouth Rinse BID Tukov-Yual, Magdalene S, NP   15 mL at 05/02/18 2003  . dextrose 5 % solution   Intravenous Continuous Flora Lipps, MD 50 mL/hr at 05/03/18 0447    . escitalopram (LEXAPRO) tablet 20 mg  20 mg Oral Daily Tukov-Yual, Magdalene S, NP      . folic acid injection 1 mg  1 mg Intravenous Daily Awilda Bill, NP   1 mg at 05/02/18 1035  . gabapentin (NEURONTIN) 250 MG/5ML solution 100 mg  100 mg Oral Q8H Tukov-Yual, Magdalene S, NP      . LORazepam (ATIVAN) injection 0.5 mg  0.5 mg Intravenous Q8H PRN Fritzi Mandes, MD      . morphine 2 MG/ML injection 1 mg  1 mg Intravenous Q4H PRN Conforti, John, DO   1 mg at 05/03/18 0544  . pantoprazole sodium (PROTONIX) 40 mg/20 mL oral suspension 40 mg  40 mg Oral Q1200 Tukov-Yual, Magdalene S, NP      . QUEtiapine (SEROQUEL) tablet 50 mg  50 mg Oral QHS Tukov-Yual, Magdalene S, NP      . thiamine (B-1) injection 100 mg  100 mg Intravenous Daily Awilda Bill, NP   100 mg at 05/02/18 1035     Discharge Medications: Please see discharge summary for a list of discharge medications.  Relevant Imaging Results:  Relevant Lab Results:   Additional Information (SSN: [id number])  Gershon Shorten, Veronia Beets, LCSW

## 2018-05-03 NOTE — Evaluation (Signed)
Clinical/Bedside Swallow Evaluation Patient Details  Name: Chad [name] MRN: [id number] Date of Birth: 1960/10/02  Today's Date: 05/03/2018 Time: SLP Start Time (ACUTE ONLY): 1100 SLP Stop Time (ACUTE ONLY): 1200 SLP Time Calculation (min) (ACUTE ONLY): 60 min  Past Medical History:  Past Medical History:  Diagnosis Date  . Anxiety   . Cancer [facility])    prostate  . Depression   . Hyperlipidemia   . Nerve pain   . Stroke [facility])    Past Surgical History:  Past Surgical History:  Procedure Laterality Date  . COLON SURGERY     En bloc extended right hemicolectomy 07/2017  . CYSTOSCOPY WITH STENT PLACEMENT Right 04/25/2018   Procedure: CYSTOSCOPY WITH STENT PLACEMENT;  Surgeon: Hollice Espy, MD;  Location: [facility] ORS;  Service: Urology;  Laterality: Right;  . tumor removed      HPI:  Pt is a 57 y.o. male with a known history of anxiety, colon cancer status post hemicolectomy, depression, hyperlipidemia, stroke w/ Left sided weakness(prior when living in [place]) now living/visiting his son. He had some complaint of shortness of breath last week and he is getting increasingly confused for last 1 week. Brought to the emergency room and intubated because he was septic and lethargic. Pt was extubated on 04/29/2018 and has been tolerating this well. Pt seems to have a declining health status including decreased oral interest/intake per chart review since approximately April 2019 [place]unsure of pt's health status prior).    Assessment / Plan / Recommendation Clinical Impression  Pt appears to present w/ oropharyngeal phase dysphagia w/ overt s/s of swallowing deficits - increased risk for aspiration. Pt consumed po trials w/ both immediate and delayed pharyngeal phase deficits w/ trials of thin liquids and Nectar liquids in larger volume(Cup). He appeared to adequately tolerate trials of Nectar liquids via TSP w/ no overt s/s of aspiration noted; no decline in  respiratory status during/post trials.  SLP Visit Diagnosis: Dysphagia, oropharyngeal phase (R13.12)    Aspiration Risk  Moderate aspiration risk    Diet Recommendation  Dysphagia level 1 (PUREE) w/ NECTAR consistency liquids via TSP ONLY. Strict aspiration precautions; feeding support at all meals.  Medication Administration: Crushed with puree(as able for safer swallowing)    Other  Recommendations Recommended Consults: (Dietician f/u) Oral Care Recommendations: Oral care BID;Staff/trained caregiver to provide oral care Other Recommendations: Order thickener from pharmacy;Prohibited food (jello, ice cream, thin soups);Remove water pitcher;Have oral suction available   Follow up Recommendations Skilled Nursing facility(TBD)      Frequency and Duration min 3x week  2 weeks       Prognosis Prognosis for Safe Diet Advancement: Guarded(-Fair) Barriers to Reach Goals: Cognitive deficits(weakness)      Swallow Study   General Date of Onset: 04/25/18 HPI: Pt is a 57 y.o. male with a known history of anxiety, colon cancer status post hemicolectomy, depression, hyperlipidemia, stroke w/ Left sided weakness(prior when living in [place]) now living/visiting his son. He had some complaint of shortness of breath last week and he is getting increasingly confused for last 1 week. Brought to the emergency room and intubated because he was septic and lethargic. Pt was extubated on 04/29/2018 and has been tolerating this well. Pt seems to have a declining health status including decreased oral interest/intake per chart review since approximately April 2019 when [place]unsure of pt's health status prior).  Type of Study: Bedside Swallow Evaluation Previous Swallow Assessment:  none reported Diet Prior to this Study: NPO(regular diet prior per pt report but no specific re: foods) Temperature Spikes Noted: No(wbc 9.0) Respiratory Status: Room air History of Recent Intubation:  Yes Length of Intubations (days): 5 days Date extubated: 04/29/18 Behavior/Cognition: Alert;Cooperative;Confused;Distractible;Requires cueing(needed cues`) Oral Cavity Assessment: Dry(sore spots in mouth; dry skin) Oral Care Completed by SLP: Yes Oral Cavity - Dentition: Poor condition;Missing dentition Vision: (n/a) Self-Feeding Abilities: Total assist(weak UEs) Patient Positioning: Upright in bed(needed positioning) Baseline Vocal Quality: Low vocal intensity Volitional Cough: Weak Volitional Swallow: Unable to elicit    Oral/Motor/Sensory Function Overall Oral Motor/Sensory Function: Generalized oral weakness Facial ROM: (slight open mouth posture) Facial Symmetry: Within Functional Limits(grossly) Lingual ROM: (overall decreased) Lingual Symmetry: Within Functional Limits(grossly) Lingual Strength: Reduced(min) Velum: Within Functional Limits(grossly) Mandible: Within Functional Limits(grossly)   Ice Chips Ice chips: Impaired Presentation: Spoon(fed; 4 trials) Oral Phase Impairments: Reduced lingual movement/coordination Oral Phase Functional Implications: Prolonged oral transit Pharyngeal Phase Impairments: Cough - Delayed;Suspected delayed Swallow;Decreased hyoid-laryngeal movement(x2/4 trials)   Thin Liquid Thin Liquid: Impaired Presentation: Cup;Self Fed(assisted; 5 trials) Oral Phase Impairments: Reduced lingual movement/coordination;Reduced labial seal Oral Phase Functional Implications: (reduced oral control suspected) Pharyngeal  Phase Impairments: Cough - Immediate;Cough - Delayed;Suspected delayed Swallow;Decreased hyoid-laryngeal movement(x3 trials)    Nectar Thick Nectar Thick Liquid: Impaired Presentation: Cup;Spoon(fed; 8 trials) Oral Phase Impairments: Reduced labial seal;Reduced lingual movement/coordination Oral phase functional implications: Prolonged oral transit Pharyngeal Phase Impairments: Suspected delayed Swallow;Decreased hyoid-laryngeal  movement;Throat Clearing - Delayed(x1 via cup; none noted during TSP trials)   Honey Thick Honey Thick Liquid: Not tested   Puree Puree: Impaired Presentation: Spoon(fed; 8 trials) Oral Phase Impairments: Reduced labial seal;Reduced lingual movement/coordination Oral Phase Functional Implications: Prolonged oral transit Pharyngeal Phase Impairments: Suspected delayed Swallow;Decreased hyoid-laryngeal movement(no coughing)   Solid     Solid: Not tested       [name], MS, CCC-SLP Shakeria Robinette 05/03/2018,3:15 PM

## 2018-05-03 NOTE — Clinical Social Work Placement (Signed)
   CLINICAL SOCIAL WORK PLACEMENT  NOTE  Date:  05/03/2018  Patient Details  Name: Ryelan Kazee MRN: [id number] Date of Birth: 17-Aug-1960  Clinical Social Work is seeking post-discharge placement for this patient at the [place] level of care (*CSW will initial, date and re-position this form in  chart as items are completed):  Yes   Patient/family provided with [place] Work Department's list of facilities offering this level of care within the geographic area requested by the patient (or if unable, by the patient's family).  Yes   Patient/family informed of their freedom to choose among providers that offer the needed level of care, that participate in Medicare, Medicaid or managed care program needed by the patient, have an available bed and are willing to accept the patient.  Yes   Patient/family informed of [employer]'s ownership interest in [facility] and [facility], as well as of the fact that they are under no obligation to receive care at these facilities.  PASRR submitted to EDS on 05/03/18     PASRR number received on       Existing PASRR number confirmed on       FL2 transmitted to all facilities in geographic area requested by pt/family on 05/03/18     FL2 transmitted to all facilities within larger geographic area on 05/03/18     Patient informed that his/her managed care company has contracts with or will negotiate with certain facilities, including the following:            Patient/family informed of bed offers received.  Patient chooses bed at       Physician recommends and patient chooses bed at      Patient to be transferred to   on  .  Patient to be transferred to facility by       Patient family notified on   of transfer.  Name of family member notified:        PHYSICIAN       Additional Comment:    _______________________________________________ Mckinlee Dunk, Veronia Beets, LCSW 05/03/2018, 11:53 AM

## 2018-05-03 NOTE — Progress Notes (Signed)
Subjective: Patient was successfully extubated and transferred out of the ICU yesterday. He continues to make some improvement neurologically. He is awake, alert and oriented x 4. He is able to follow commands without difficulty. He complains today of sore throat and feeling thirsty. He is however NPO due to high risk for aspiration.  Objective: Current vital signs: BP 135/75 (BP Location: Right Arm)   Pulse 68   Temp 98.7 F (37.1 C) (Oral)   Resp 18   Ht 6' (1.829 m)   Wt 76.5 kg   SpO2 94%   BMI 22.87 kg/m  Vital signs in last 24 hours: Temp:  [98 F (36.7 C)-98.7 F (37.1 C)] 98.7 F (37.1 C) (10/07 0755) Pulse Rate:  [48-68] 68 (10/07 0755) Resp:  [10-18] 18 (10/07 0755) BP: (106-145)/(62-79) 135/75 (10/07 0755) SpO2:  [94 %-100 %] 94 % (10/07 0755) Weight:  [76.5 kg] 76.5 kg (10/07 0500)  Intake/Output from previous day: 10/06 0701 - 10/07 0700 In: 854.4 [I.V.:758.9; IV Piggyback:95.5] Out: 1160 [Urine:1160] Intake/Output this shift: No intake/output data recorded. Nutritional status:  Diet Order            Diet NPO time specified  Diet effective now              Neurologic Exam: Neurological Examination     Mental Status: Alert and oriented x 4. Speech is low but clear. Able to follow commands   Cranial Nerves: II: Discs flat bilaterally; Blinks to bilateral confrontation, pupils equal, round, reactive to light and accommodation III,IV, VI: ptosis not present, extra-ocular motions intact bilaterally V,VII: Face symetric, facial light touch sensation normal bilaterally VIII: hearing normal bilaterally IX,X: gag reflex deferred XI: bilateral shoulder shrug XII: midline tongue extension Motor: Able to lift RUE against gravity and maintain.  LUE at 1/5.  Able to flex both lower extremities at the knees and wiggle the toes but does not lift the legs off the bed.   Sensory: Pinprick and light touch intact throughout, bilaterally Deep Tendon Reflexes: 2+ and  symmetric with absent AJ;s bilaterally Plantars: Equivocal bilaterally Cerebellar: Too weak to perform Gait: not tested due to safety concerns  Lab Results: Basic Metabolic Panel: Recent Labs  Lab 04/27/18 0417 04/28/18 0430 04/29/18 0337 04/30/18 0457 05/01/18 0547  NA 142 145 151* 148* 147*  K 3.1* 3.9 3.9 3.8 3.8  CL 105 109 114* 114* 114*  CO2 _0 GLUCOSE 106* 122* 112* 111* 120*  BUN 81* 67* 52* 48* 44*  CREATININE 2.74* 1.78* 1.38* 1.16 1.17  CALCIUM 6.8* 7.3* 7.7* 7.6* 7.5*  MG 2.9*  --   --   --   --   PHOS 3.5  --   --   --  2.7    Liver Function Tests: No results for input(s): AST, ALT, ALKPHOS, BILITOT, PROT, ALBUMIN in the last 168 hours. No results for input(s): LIPASE, AMYLASE in the last 168 hours. No results for input(s): AMMONIA in the last 168 hours.  CBC: Recent Labs  Lab 04/26/18 1041 04/28/18 0430 04/29/18 0337 04/30/18 0457 05/01/18 0547  WBC 7.9 10.2 9.7 10.1 9.0  NEUTROABS  --   --   --   --  7.3*  HGB 7.6* 8.6* 9.0* 10.5* 9.4*  HCT 22.9* 26.4* 27.3* 32.4* 30.8*  MCV 63.9* 64.9* 64.8* 65.7* 65.4*  PLT 200 166 166 177 191    Cardiac Enzymes: Recent Labs  Lab 04/27/18 0417  CKTOTAL 3,618*  CKMB 38.6*  Lipid Panel: Recent Labs  Lab 04/27/18 1608 04/28/18 1105  TRIG 113 56    CBG: Recent Labs  Lab 04/30/18 2001 04/30/18 2344 05/01/18 0352 05/01/18 0744 05/01/18 1147  GLUCAP 94 105* 108* 110* 65    Microbiology: Results for orders placed or performed during the hospital encounter of 04/25/18  Culture, blood (routine x 2)     Status: None   Collection Time: 04/25/18  9:43 AM  Result Value Ref Range Status   Specimen Description BLOOD RIGHT ARM  Final   Special Requests   Final    BOTTLES DRAWN AEROBIC AND ANAEROBIC Blood Culture results may not be optimal due to an inadequate volume of blood received in culture bottles   Culture   Final    NO GROWTH 5 DAYS Performed at [facility], [address] [place], [place] [address]    Report Status 04/30/2018 FINAL  Final  Culture, blood (routine x 2)     Status: None   Collection Time: 04/25/18  9:43 AM  Result Value Ref Range Status   Specimen Description BLOOD LEFT ARM  Final   Special Requests   Final    BOTTLES DRAWN AEROBIC AND ANAEROBIC Blood Culture results may not be optimal due to an inadequate volume of blood received in culture bottles   Culture   Final    NO GROWTH 5 DAYS Performed at [facility], [address], [place], [place] [address]    Report Status 04/30/2018 FINAL  Final  Urine culture     Status: None   Collection Time: 04/25/18  9:43 AM  Result Value Ref Range Status   Specimen Description   Final    URINE, CATHETERIZED Performed at [facility], [address] [place], [place] [address]    Special Requests   Final    NONE Performed at [facility], [address], [place], [place] [address]    Culture   Final    NO GROWTH Performed at [facility], [place] [name] [place], [place] [address]    Report Status 04/26/2018 FINAL  Final  MRSA PCR Screening     Status: None   Collection Time: 04/25/18 11:58 AM  Result Value Ref Range Status   MRSA by PCR NEGATIVE NEGATIVE Final    Comment:        The GeneXpert MRSA Assay (FDA approved for NASAL specimens only), is one component of a comprehensive MRSA colonization surveillance program. It is not intended to diagnose MRSA infection nor to guide or monitor treatment for MRSA infections. Performed at [facility], [address], [place], [place] [address]   Urine Culture     Status: None   Collection Time: 04/25/18  6:13 PM  Result Value Ref Range Status   Specimen Description   Final    URINE, RANDOM Performed at [facility], [address], [place], [place] [address]    Special Requests   Final    NONE Performed at [facility], [address] [place], [place]  [address]    Culture   Final    NO GROWTH Performed at [facility], [place] [address] [place], [place] [address]    Report Status 04/27/2018 FINAL  Final    Coagulation Studies: Recent Labs    05/01/18 0547  LABPROT 16.1*  INR 1.30    Imaging: No results found.  Medications:  I have reviewed the patient's current medications. Prior to  Admission:  Medications Prior to Admission  Medication Sig Dispense Refill Last Dose  . atorvastatin (LIPITOR) 80 MG tablet Take 1 tablet (80 mg total) by mouth daily. 90 tablet 3 Unknown at Unknown  . escitalopram (LEXAPRO) 20 MG tablet Take 1 tablet (20 mg total) by mouth daily. 90 tablet 3 Unknown at Unknown  . gabapentin (NEURONTIN) 100 MG capsule Take one to 2 tabs for pain 3 x day 180 capsule 3 Unknown at Unknown  . ibuprofen (ADVIL,MOTRIN) 200 MG tablet Take 200 mg by mouth every 6 (six) hours as needed.   Unknown at PRN  . traMADol (ULTRAM) 50 MG tablet Take 1 tablet (50 mg total) by mouth every 6 (six) hours as needed. 20 tablet 0 Unknown at PRN  . furosemide (LASIX) 20 MG tablet Take 20 mg by mouth.   Taking  . QUEtiapine (SEROQUEL) 50 MG tablet Take 1 tablet (50 mg total) by mouth at bedtime. 90 tablet 3    Scheduled: . chlorhexidine gluconate (MEDLINE KIT)  15 mL Mouth Rinse BID  . escitalopram  20 mg Oral Daily  . folic acid  1 mg Intravenous Daily  . gabapentin  100 mg Oral Q8H  . pantoprazole sodium  40 mg Oral Q1200  . QUEtiapine  50 mg Oral QHS  . thiamine injection  100 mg Intravenous Daily    Patient seen and examined.  Clinical course and management discussed.  Necessary edits performed.  I agree with the above.  Assessment and plan of care developed and discussed below.    Assessments/plan: Patient's mental status improved.  Has continued LUE and BLE weakness.  It appears that the left sided findings are chronic but unclear etiology for RLE weakness.  Although may be related to debility and current illness will rule  out other etiologies.  Does not appear to have a lytic lesion on CT of the abdomen.    Recommendations: 1. MRI of the lumbar spine if not contraindicated with ureteral stent.    This patient was staffed with Dr. Magda Paganini, Doy Mince who personally evaluated patient, reviewed documentation and agreed with assessment and plan of care as above.  Rufina Falco, DNP, FNP-BC Board certified Nurse Practitioner Neurology Department   LOS: 8 days   05/03/2018  10:37 AM  Alexis Goodell, MD Neurology [phone]  05/03/2018  2:15 PM

## 2018-05-03 NOTE — Clinical Social Work Note (Signed)
Clinical Social Work Assessment  Patient Details  Name: Chad [name] MRN: [id number] Date of Birth: 20-Jan-1961  Date of referral:  05/03/18               Reason for consult:  Facility Placement                Permission sought to share information with:    Permission granted to share information::     Name::        Agency::     Relationship::     Contact Information:     Housing/Transportation Living arrangements for the past 2 months:  Single Family Home Source of Information:  Patient, Adult Children Patient Interpreter Needed:  None Criminal Activity/Legal Involvement Pertinent to Current Situation/Hospitalization:  No - Comment as needed Significant Relationships:  Adult Children Lives with:  Adult Children Do you feel safe going back to the place where you live?  Yes Need for family participation in patient care:  Yes (Comment)  Care giving concerns:  Patient lives in [place], [place] with his son Chad [name], daughter in law and 2 grandchildren.    Social Worker assessment / plan:  Holiday representative (CSW) received placement consult from MD. Per MD patient can't walk or move his legs and arms. PT is pending. CSW met with patient alone at bedside to discuss D/C plan. Patient was laying in the bed and was oriented X2 to self and place. CSW introduced self and explained role of CSW department. Per patient he lives with his son. CSW asked patient if he receives disability/SSI and he stated that he does but does not want CSW to take his check. CSW explained to patient that MD believes that he needs nursing home placement and he will have to sign over his SSI check to the facility. Patient reported that he wants to go home and does not want to go to a facility.   CSW contacted patient's son Chad [name] and made him aware of above. Per Chad [name] patient has been living with him and his family in [place] since April 2019. Per Chad [name] patient does have [place] Medicaid and got approved for it 1 month ago. Per  Chad [name] patient also recently got approved for SSI and got his first check last week. Per son patient is alone at home for 13-14 hours per day because he works and then his children are involved in extracurricular activities. Per Chad [name] patient needs nursing home level of care because he can't take care of himself. Per son patient is incontinent and he has to clean patient every day when he comes home. CSW made son aware that medicaid beds are limited and patient will likely have to go outside of [facility], stay at the facility for at least 30 days and sign over his SSI check to the facility. Per son he will talk to patient about going to a facility. CSW explained to son that if patient continues to refuse placement then we can order a psych consult to determine if patient has capacity to make decisions. CSW explained that if patient does have capacity to make decisions that we will have to follow his wishes however if patient does not have capacity then a family member can make decisions for patient. Per son patient does not have a HPOA. MD aware of above. CSW will continue to follow and assist as needed.    Employment status:  Disabled (Comment on whether or not currently receiving Disability) Insurance information:  Medicaid In  State PT Recommendations:  Not assessed at this time Information / Referral to community resources:  [place]  Patient/Family's Response to care:  Patient's son requested SNF placement and understands that barriers to finding a medicaid bed.   Patient/Family's Understanding of and Emotional Response to Diagnosis, Current Treatment, and Prognosis:  Patient's son was very pleasant and thanked CSW for assistance.   Emotional Assessment Appearance:  Appears older than stated age Attitude/Demeanor/Rapport:  Irrational, Guarded Affect (typically observed):  Flat, Quiet Orientation:  Oriented to Self, Oriented to Place, Fluctuating Orientation (Suspected and/or  reported Sundowners) Alcohol / Substance use:  Not Applicable Psych involvement (Current and /or in the community):  No (Comment)  Discharge Needs  Concerns to be addressed:  Discharge Planning Concerns Readmission within the last 30 days:  No Current discharge risk:  Dependent with Mobility, Chronically ill Barriers to Discharge:  Continued Medical Work up   [employer], Veronia Beets, LCSW 05/03/2018, 11:54 AM

## 2018-05-03 NOTE — Progress Notes (Signed)
Physical Therapy Evaluation Patient Details Name: Chad [name] MRN: [id number] DOB: 1961-01-11 Today's Date: 05/03/2018   History of Present Illness  Pt presented to ED with altered mental status and respiratory distress, was admitted for severe sepsis and intubated on 04/25/2018. Had cystoscopy with stent placement and foley cath. replacement procedure performed on 9/29. Required vent support on 9/30-10/1. On 10/2 pt's urine cytology was positive for high-grade urothelial carcinoma. Imaging showed potential ileus. PMH includes anxiety, depression, HDL, CA, CVA, and nerve pain.  Clinical Impression  Pt is a pleasant 57 year old male who was admitted for severe sepsis. Pt performs rolling with mod-max A, and sit-to-supine with max A +2 for physical assist. Sitting at EOB pt demonstrates ability to control trunk with RUE for support on mattress ~10 sec before posterior postural muscles fatigue. Could perform repeatedly bouts of independent sitting ~5-6x. Pt follows commands well, with could VC shift weight forward demonstrating presence of dynamic trunk control.  Pt demonstrates deficits with strength/balance/mobility/endurace. Pt is not at his baseline. Would benefit from skilled PT to address above deficits and promote optimal return to PLOF.      Follow Up Recommendations SNF    Equipment Recommendations  Rolling walker with 5" wheels    Recommendations for Other Services       Precautions / Restrictions Precautions Precautions: None Restrictions Weight Bearing Restrictions: No      Mobility  Bed Mobility Overal bed mobility: Needs Assistance Bed Mobility: Rolling;Supine to Sit;Sit to Supine Rolling: Mod assist;Max assist(Rolling to L Mod A, rolling to R Max A)   Supine to sit: Max assist;+2 for physical assistance Sit to supine: Max assist;+2 for physical assistance   General bed mobility comments: Pt demonstrates ability to disassociate his trunk and pull onto his L side with  his RUE with Mod A for LE assist and partial trunk. Requires Max A to roll to R side d/t L side hemiplega. Moving for supine-to-sit and back pt needs assist to manage LE and trunk.  Transfers                 General transfer comment: Not tested, d/t severe generalized weakness in UE and LEs this date.  Ambulation/Gait                Stairs            Wheelchair Mobility    Modified Rankin (Stroke Patients Only)       Balance Overall balance assessment: Needs assistance Sitting-balance support: Single extremity supported;Feet supported Sitting balance-Leahy Scale: Poor Sitting balance - Comments: Can maintain sitting at EOB independently ~10 sec before fatigue. Pt posturing is flexed in thoracic and cervical spine, with VC could ext more, tends to lean posterolaterally toward R. Postural musculature fatigues quickly. Postural control: Posterior lean;Right lateral lean                                   Pertinent Vitals/Pain Pain Assessment: No/denies pain    Home Living Family/patient expects to be discharged to:: Private residence Living Arrangements: Children(Son (Chistopher)) Available Help at Discharge: Family;Available PRN/intermittently Type of Home: House Home Access: Stairs to enter Entrance Stairs-Rails: Right;Left;Can reach both Entrance Stairs-Number of Steps: 6 Home Layout: One level Home Equipment: Cane - single point Additional Comments: Pt could not remember bathroom set up.    Prior Function Level of Independence: Independent  Comments: Per patient he was independent with all ADL, IADL, and ambulation.     Hand Dominance   Dominant Hand: Right    Extremity/Trunk Assessment   Upper Extremity Assessment Upper Extremity Assessment: Generalized weakness(RUE 2+/5, 2-/5)    Lower Extremity Assessment Lower Extremity Assessment: Generalized weakness(RLE 2+/5, LLE 2-/5)       Communication   Communication:  No difficulties  Cognition Arousal/Alertness: Awake/alert Behavior During Therapy: WFL for tasks assessed/performed Overall Cognitive Status: No family/caregiver present to determine baseline cognitive functioning                                 General Comments: Pt was able to follow a conversation without difficulty. Spoke with very low volume.      General Comments      Exercises Other Exercises Other Exercises: Supine BLE: AROM ankle pumps x10 reps. AAROM heel slides, SLRs, and hip ABD/ADD with Mod A x6 reps. All ther-ex VC for technique.   Assessment/Plan    PT Assessment Patient needs continued PT services  PT Problem List Decreased strength;Decreased activity tolerance;Decreased balance;Decreased mobility       PT Treatment Interventions DME instruction;Gait training;Stair training;Functional mobility training;Therapeutic activities;Therapeutic exercise;Balance training;Neuromuscular re-education;Patient/family education    PT Goals (Current goals can be found in the Care Plan section)  Acute Rehab PT Goals Patient Stated Goal: To be able to move again. PT Goal Formulation: With patient Time For Goal Achievement: 05/17/18 Potential to Achieve Goals: Fair    Frequency Min 2X/week   Barriers to discharge        Co-evaluation               AM-PAC PT "6 Clicks" Daily Activity  Outcome Measure Difficulty turning over in bed (including adjusting bedclothes, sheets and blankets)?: Unable Difficulty moving from lying on back to sitting on the side of the bed? : Unable Difficulty sitting down on and standing up from a chair with arms (e.g., wheelchair, bedside commode, etc,.)?: Unable Help needed moving to and from a bed to chair (including a wheelchair)?: Total Help needed walking in hospital room?: Total Help needed climbing 3-5 steps with a railing? : Total 6 Click Score: 6    End of Session   Activity Tolerance: Patient limited by  fatigue Patient left: in bed;with call bell/phone within reach;with bed alarm set Nurse Communication: Mobility status PT Visit Diagnosis: Unsteadiness on feet (R26.81);Muscle weakness (generalized) (M62.81);Hemiplegia and hemiparesis Hemiplegia - Right/Left: Left Hemiplegia - dominant/non-dominant: Non-dominant Hemiplegia - caused by: Cerebral infarction    Time: 0234-0309 PT Time Calculation (min) (ACUTE ONLY): 35 min   Charges:              Algis Downs, SPT   Algis Downs 05/03/2018, 4:45 PM

## 2018-05-03 NOTE — Progress Notes (Signed)
Urology consult follow-up  Patient has been transferred to the floor.  He is awake and conversive but does seem what altered.  He does answer questions appropriately.  Vitals reviewed Alert and concessive but garbled.  Does appear to have some understanding of our conversation today. No acute distress Foley catheter in place draining concentrated appearing urine, no blood or clots   Assessment/plan: 57 year old male found down, hypothermic with multiorgan failure including right hydronephrosis status post emergent ureteral stent placement with adequate decompression of his right collecting system.  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  Patient will ultimately need further staging imaging with contrasted CT abdomen pelvis as well as Chest CT if he improves clinically prior to discharge.  Prefer to hold on this until patient can tolerate lying still and has passed speech and swallow to tolerate oral contrast.  Will order tomorrow if passes speech/ swallow.  I did disclose with the patient his diagnosis of upper tract urothelial carcinoma and plans for further staging.  His ex-wife and ex-mother-in-law were at the bedside.  His son will be joining them this evening.  Although initially there was some concern during my conversation today that he did not understand, he asked the question appropriately "will I need to be cut on?" which does reflect his understanding of his diagnosis and implications.    Hollice Espy, MD  Cases discussed with dr. Posey Pronto today

## 2018-05-03 NOTE — Progress Notes (Signed)
Chad [name] at [place] NAME: Marquavis Hannen    MR#:  [id number]  DATE OF BIRTH:  [birth year]  SUBJECTIVE:   Patient more awake alert answered most of my questions appropriately. Speech a little bit garbled. Mouth is dry wants to drink water. Swallow eval still not done. Got transferred out of ICU yesterday. No family in the room. REVIEW OF SYSTEMS:   Review of Systems  Constitutional: Negative for chills, fever and weight loss.  HENT: Negative for ear discharge, ear pain and nosebleeds.   Eyes: Negative for blurred vision, pain and discharge.  Respiratory: Negative for sputum production, shortness of breath, wheezing and stridor.   Cardiovascular: Negative for chest pain, palpitations, orthopnea and PND.  Gastrointestinal: Negative for abdominal pain, diarrhea, nausea and vomiting.  Genitourinary: Negative for frequency and urgency.  Musculoskeletal: Negative for back pain and joint pain.  Neurological: Positive for focal weakness and weakness. Negative for sensory change and speech change.  Psychiatric/Behavioral: Negative for depression and hallucinations. The patient is not nervous/anxious.    Tolerating Diet:npo Tolerating PT: pending  DRUG ALLERGIES:   Allergies  Allergen Reactions  . Penicillins Rash    VITALS:  Blood pressure 135/75, pulse 68, temperature 98.7 F (37.1 C), temperature source Oral, resp. rate 18, height 6' (1.829 m), weight 76.5 kg, SpO2 94 %.  PHYSICAL EXAMINATION:   Physical Exam  GENERAL:  57 y.o.-year-old patient lying in the bed with no acute distress. Looks older than his stated age EYES: Pupils equal, round, reactive to light and accommodation. No scleral icterus. Extraocular muscles intact.  HEENT: Head atraumatic, normocephalic. Oropharynx and nasopharynx clear.  NECK:  Supple, no jugular venous distention. No thyroid enlargement, no tenderness.  LUNGS: Normal breath sounds bilaterally, no  wheezing, rales, rhonchi. No use of accessory muscles of respiration.  CARDIOVASCULAR: S1, S2 normal. No murmurs, rubs, or gallops.  ABDOMEN: Soft, nontender, nondistended. Bowel sounds present. No organomegaly or mass.  EXTREMITIES: No cyanosis, clubbing or edema b/l.    NEUROLOGIC: Cranial nerves II through XII are intact. Patient has bilateral upper and lower extremity significant weakness more on the left and the right from previous stroke. PSYCHIATRIC:  patient is alert and oriented x 2.  SKIN: No obvious rash, lesion, or ulcer.   LABORATORY PANEL:  CBC Recent Labs  Lab 05/01/18 0547  WBC 9.0  HGB 9.4*  HCT 30.8*  PLT 191    Chemistries  Recent Labs  Lab 04/27/18 0417  05/01/18 0547  NA 142   < > 147*  K 3.1*   < > 3.8  CL 105   < > 114*  CO2 28   < > 27  GLUCOSE 106*   < > 120*  BUN 81*   < > 44*  CREATININE 2.74*   < > 1.17  CALCIUM 6.8*   < > 7.5*  MG 2.9*  --   --    < > = values in this interval not displayed.   Cardiac Enzymes No results for input(s): TROPONINI in the last 168 hours. RADIOLOGY:  No results found. ASSESSMENT AND PLAN:  Chad [name]  is a 57 y.o. male with a known history of anxiety, colon cancer status post hemicolectomy, depression, hyperlipidemia, recent stroke-lives in [place] and visiting his son over here.He had some complaint of shortness of breath last week and he is getting increasingly confused for last 1 week.  *Septic shock secondary to UTI.  Cultures negative.  Cefepime completed rx  *Right hydronephrosis.  Status post right ureteral stent. Pathology shows high-grade urothelial carcinoma--will need CT chest/abd/pelvis for staging once able to swallow for po contrast -Foley placed draining well  *Acute hypoxic respiratory failure secondary to septic shock.  Extubated. Doing well  *Acute metabolic encephalopathy.  Multifactorial.  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 Using walker at home per son  *Elevated LFTs Likely due to septic shock, continue to monitor. Patient with chronic hepatitis C. improving  *Microcytic anemia he needs outpatient transfusions frequently.  Continue to transfuse to keep hemoglobin greater than 7  *Acute kidney injury Secondary to right hydronephrosis and septic shock. Improved  * Rhabdomyolysis reSolved  *COPD and chronic smoking Currently no exacerbation symptoms, may continue bronchodilators.  *Severe deconditioning /?critical illness myopathy/ late CVA deficits -PT consult placed -will benefit form rehab. Pt is total care at present -CSW consult placed. -?competency eval if  Needed  D/w son [name] at length  Case discussed with Care Management/Social Worker. Management plans discussed with the patient, family and they are in agreement.  CODE STATUS: FULL  DVT Prophylaxis: SCD  TOTAL TIME TAKING CARE OF THIS PATIENT: *30* minutes.  >50% time spent on counselling and coordination of care  POSSIBLE D/C IN *few* DAYS, DEPENDING ON CLINICAL CONDITION.  Note: This dictation was prepared with Dragon dictation along with smaller phrase technology. Any transcriptional errors that result from this process are unintentional.  Fritzi Mandes M.D on 05/03/2018 at 5:26 PM  Between 7am to 6pm - Pager - [phone]  After 6pm go to [email or web address] - password [email or web address] [facility]  Office  [phone]  CC: Primary care physician; [name], MDPatient ID: [name], male   DOB: January 09, 1961, 57 y.o.   MRN: [id number]

## 2018-05-04 DIAGNOSIS — N1330 Unspecified hydronephrosis: Secondary | ICD-10-CM

## 2018-05-04 DIAGNOSIS — R1310 Dysphagia, unspecified: Secondary | ICD-10-CM

## 2018-05-04 DIAGNOSIS — C689 Malignant neoplasm of urinary organ, unspecified: Secondary | ICD-10-CM

## 2018-05-04 DIAGNOSIS — Z515 Encounter for palliative care: Secondary | ICD-10-CM

## 2018-05-04 DIAGNOSIS — Z7189 Other specified counseling: Secondary | ICD-10-CM

## 2018-05-04 LAB — COMPLEMENT, TOTAL: Compl, Total (CH50): 14 U/mL — ABNORMAL LOW (ref 42–999999)

## 2018-05-04 NOTE — Plan of Care (Signed)

## 2018-05-04 NOTE — Consult Note (Signed)
Consultation Note Date: 05/04/2018   Patient Name: Chad [name]  DOB: [birth year]  MRN: [id number]  Age / Sex: 57 y.o., male  PCP: [name], MD Referring Physician: Fritzi Mandes, MD  Reason for Consultation: Establishing goals of care  HPI/Patient Profile: 57 y.o. male  with past medical history of CVA, HLD, depression, anxiety, colon cancer s/p hemicolectomy and hepatitis C admitted on 04/25/2018 with altered mental status and respiratory distress. Patient admit for septic shock secondary to UTI requiring intubation. Extubated 10/3. CT revealed severe hydronephrosis. Stent placed. Pathology shows high grade urothelial carcinoma. Urology following. Patient will need CT chest/ab/pelvis for staging once able to swallow PO contrast. Foley draining well. Ongoing acute metabolic encephalopathy. 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 SLP following and patient current on dysphagia 1 diet with nectar thick liquids. Palliative medicine consultation for goals of care.   Clinical Assessment and Goals of Care:  I have reviewed medical records, discussed with care team, and initially met with patient at bedside.   Today, Chad [name] is awake, alert, oriented. Answering questions appropriately and does seem to have a good understanding of his new diagnosis of cancer. Discussed course of hospitalization including diagnoses and interventions. Robbin understands he will go to a skilled nursing facility after discharge. He understands further testing is required in order for further plan of treatment per urology. Chad [name] shares that his father and brother both died from cancer and believing he may not have long to live once discharged to a nursing facility. I did explain that functional and nutritional status do play a part in his ability to pursue aggressive oncology workup. Explained the importance of discussing his wishes  with his son so we can honor his wishes.  Also spoke with son, [name], via telephone. Introduced Palliative Medicine as specialized medical care for people living with serious illness. It focuses on providing relief from the symptoms and stress of a serious illness. The goal is to improve quality of life for both the patient and the family.  We discussed a brief life review of the patient. [name] shares his father's declining health since colon cancer and stroke. He moved him here from [place] for which he continued not to "help himself." [name] and his family are gone throughout the day/early evening and his father would sit in the recliner and smoke all day. [name] shares that he needs to go to a nursing facility where he may be "motivated" and encouraged to do more than sit around.   Discussed course of hospitalization including diagnoses and interventions. [name] also understands new diagnosis of urothelial cancer and need for CT scan for staging. [name] shares that he spoke with his father last night. [name] asked him if he "wanted to fight" for which is father responded "I would like to try." [name] acknowledges this being a "waiting game" until his CT scan is completed.   Advanced directives, concepts specific to code status, and artifical feeding and hydration were discussed. The patient does NOT have an advanced directive or  documented living will. [name] is interested in completing this with the chaplain when possible. [name] speaks of getting off early from work this afternoon in order to complete AD packet with his father and chaplain. Consult placed. [name] tells me his father has never talked about his wishes. Encouraged [name] to further discuss with his father wishes regarding heroic measures, explaining that they are faced with big decisions with declining health.   Palliative Care services outpatient were explained and offered. [name] agreeable with outpatient palliative referral.   Questions and  concerns were addressed. [name] has PMT contact information.     SUMMARY OF RECOMMENDATIONS   Good initial palliative discussion with patient and spoke with son via telephone.   Continue FULL code/FULL scope treatment. Son acknowledges that his father "would like to try" and fight. Pending further testing per urology.  Chaplain consult placed to create advanced directive. Son interested in completing with his father and chaplain this afternoon.   Son agreeable with outpatient palliative referral. SW notified.   PMT will continue to follow inpatient. Patient/son could benefit from further discussion/education on MOST form.  Code Status/Advance Care Planning:  Full code  Symptom Management:   Per attending  Palliative Prophylaxis:   Aspiration, Delirium Protocol, Frequent Pain Assessment, Oral Care and Turn Reposition  Additional Recommendations (Limitations, Scope, Preferences):  Full Scope Treatment  Psycho-social/Spiritual:   Desire for further Chaplaincy support:yes  Additional Recommendations: Caregiving  Support/Resources  Prognosis:   Unable to determine  Discharge Planning: [place] for rehab with Palliative care service follow-up      Primary Diagnoses: Present on Admission: . Sepsis ([place])   I have reviewed the medical record, interviewed the patient and family, and examined the patient. The following aspects are pertinent.  Past Medical History:  Diagnosis Date  . Anxiety   . Cancer [facility])    prostate  . Depression   . Hyperlipidemia   . Nerve pain   . Stroke [facility])    Social History   Socioeconomic History  . Marital status: Single    Spouse name: Not on file  . Number of children: Not on file  . Years of education: Not on file  . Highest education level: Not on file  Occupational History  . Not on file  Social Needs  . Financial resource strain: Not on file  . Food insecurity:    Worry: Not on file    Inability: Not on  file  . Transportation needs:    Medical: Not on file    Non-medical: Not on file  Tobacco Use  . Smoking status: Current Every Day Smoker    Packs/day: 1.00    Types: Cigarettes  . Smokeless tobacco: Never Used  Substance and Sexual Activity  . Alcohol use: Never    Frequency: Never  . Drug use: Never  . Sexual activity: Not on file  Lifestyle  . Physical activity:    Days per week: Not on file    Minutes per session: Not on file  . Stress: Not on file  Relationships  . Social connections:    Talks on phone: Not on file    Gets together: Not on file    Attends religious service: Not on file    Active member of club or organization: Not on file    Attends meetings of clubs or organizations: Not on file    Relationship status: Not on file  Other Topics Concern  . Not on file  Social History Narrative  .  Not on file   Family History  Problem Relation Age of Onset  . Prostate cancer Father    Scheduled Meds: . chlorhexidine gluconate (MEDLINE KIT)  15 mL Mouth Rinse BID  . escitalopram  20 mg Oral Daily  . folic acid  1 mg Oral Daily  . gabapentin  100 mg Oral Q8H  . pantoprazole sodium  40 mg Oral Q1200  . QUEtiapine  50 mg Oral QHS  . thiamine injection  100 mg Intravenous Daily   Continuous Infusions: . dextrose Stopped (05/04/18 0833)   PRN Meds:.morphine injection Medications Prior to Admission:  Prior to Admission medications   Medication Sig Start Date End Date Taking? Authorizing Provider  atorvastatin (LIPITOR) 80 MG tablet Take 1 tablet (80 mg total) by mouth daily. 11/11/17  Yes [name], MD  escitalopram (LEXAPRO) 20 MG tablet Take 1 tablet (20 mg total) by mouth daily. 11/11/17  Yes [name], MD  gabapentin (NEURONTIN) 100 MG capsule Take one to 2 tabs for pain 3 x day 11/11/17  Yes [name], MD  ibuprofen (ADVIL,MOTRIN) 200 MG tablet Take 200 mg by mouth every 6 (six) hours as needed.   Yes [provider]  traMADol (ULTRAM) 50  MG tablet Take 1 tablet (50 mg total) by mouth every 6 (six) hours as needed. 04/21/18 04/21/19 Yes Sable Feil, PA-C  furosemide (LASIX) 20 MG tablet Take 20 mg by mouth.    [provider]  QUEtiapine (SEROQUEL) 50 MG tablet Take 1 tablet (50 mg total) by mouth at bedtime. 11/11/17   [name], MD   Allergies  Allergen Reactions  . Penicillins Rash   Review of Systems  Constitutional: Positive for activity change.  Neurological: Positive for weakness.   Physical Exam  Constitutional: He is oriented to person, place, and time. He is cooperative. He appears ill.  HENT:  Head: Normocephalic and atraumatic.  Pulmonary/Chest: No accessory muscle usage. No tachypnea. No respiratory distress.  Abdominal: There is no tenderness.  Neurological: He is alert and oriented to person, place, and time.  Skin: Skin is warm and dry. Ecchymosis noted.  Psychiatric: He has a normal mood and affect. His speech is normal and behavior is normal. Cognition and memory are normal.  Nursing note and vitals reviewed.  Vital Signs: BP 131/74 (BP Location: Right Arm)   Pulse 61   Temp 97.9 F (36.6 C) (Oral)   Resp 18   Ht 6' (1.829 m)   Wt 80.1 kg   SpO2 96%   BMI 23.94 kg/m  Pain Scale: 0-10   Pain Score: 0-No pain   SpO2: SpO2: 96 % O2 Device:SpO2: 96 % O2 Flow Rate: .O2 Flow Rate (L/min): 15 L/min  IO: Intake/output summary:   Intake/Output Summary (Last 24 hours) at 05/04/2018 1439 Last data filed at 05/04/2018 1300 Gross per 24 hour  Intake 220 ml  Output 1150 ml  Net -930 ml    LBM: Last BM Date: 05/03/18 Baseline Weight: Weight: 66.7 kg Most recent weight: Weight: 80.1 kg     Palliative Assessment/Data: PPS 40%   Flowsheet Rows     Most Recent Value  Intake Tab  Referral Department  Hospitalist  Unit at Time of Referral  Med/Surg Unit  Palliative Care Primary Diagnosis  Cancer  Palliative Care Type  New Palliative care  Reason for referral  Clarify Goals of  Care  Date first seen by Palliative Care  05/03/18  Clinical Assessment  Palliative  Performance Scale Score  40%  Psychosocial & Spiritual Assessment  Palliative Care Outcomes  Patient/Family meeting held?  Yes  Who was at the meeting?  patient and son  Palliative Care Outcomes  Clarified goals of care, Provided psychosocial or spiritual support, ACP counseling assistance, Linked to palliative care logitudinal support      Time In: 1010-1050, 1150-1210 Time Total: 11mn Greater than 50%  of this time was spent counseling and coordinating care related to the above assessment and plan.  Signed by:  MIhor Dow FNP-C Palliative Medicine Team  Phone: [phone]: [phone]  Please contact Palliative Medicine Team phone at [phone] questions and concerns.  For individual provider: See AShea Evans

## 2018-05-04 NOTE — Progress Notes (Signed)
New referral for outpatient PALLIATIVE to follow at [facility] received from [facility]. Patient information faxed to referral. Flo Shanks RN, BSN, [facility] liaison [phone]

## 2018-05-04 NOTE — Plan of Care (Signed)
  Problem: Education: Goal: Knowledge of General Education information will improve Description Including pain rating scale, medication(s)/side effects and non-pharmacologic comfort measures Outcome: Progressing   

## 2018-05-04 NOTE — Progress Notes (Addendum)
Speech Language Pathology Treatment: Dysphagia  Patient Details Name: Chad [name] MRN: [id number] DOB: [birth year] Today's Date: 05/04/2018 Time: 3244-0102 SLP Time Calculation (min) (ACUTE ONLY): 45 min  Assessment / Plan / Recommendation Clinical Impression  Pt seen for ongoing assessment of swallowing and toleration of diet. Pt is more engaged w/ taking po trials this morning and is feeding himself w/ setup. Noted pt does not use his LUE to help feed himself - pt stated he had his stroke last November 2018 and he has had Left sided weakness since them including oral weakness. Min Dysarthric speech is apparent. Overall, pt is improved from his presentation at the BSE yesterday. MD agreed.  Pt consumed po trials of Puree foods and Nectar consistency liquids via Cup w/ improved oral awareness, bolus management, and swallowing. No immediate, overt s/s of aspiration noted although pt did exhibit a delayed throat clearing during multiple bites of the [employer]. Educated pt on slowing down, taking a break b/t trials, and using a f/u, DRY swallow to aid clearing any pharyngeal residue from the boluses. During the Oral phase, pt exhibited min prolonged bolus management w/ Left oral residue and slight-min loss from Left corner of mouth - pt did not seem fully aware of the residue. Pt was educated on lingual sweeping and f/u, DRY swallows. Pt's presentation and toleration of diet appears improved from yesterday.  Recommend continue w/ current diet of Dysphagia level 1 w/ NECTAR consistency liquids; strict aspiration precautions including lingual sweeping and DRY swallows to aid oral clearing. Pt does exhibit Left oral weakness during po intake resulting in Left oral residue, loss. 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 Recommend continued f/u w/ ST services for ongoing assessment of toleration of oral intake; OMEs. MD and NSG updated.      HPI HPI: Pt is a 57 y.o. male with a known history of anxiety, colon cancer status post hemicolectomy, depression, hyperlipidemia, stroke w/ Left sided weakness(prior when living in [place]) now living/visiting his son. He had some complaint of shortness of breath last week and he is getting increasingly confused for last 1 week. Brought to the emergency room and intubated because he was septic and lethargic. Pt was extubated on 04/29/2018 and has been tolerating this well. Pt seems to have a declining health status including decreased oral interest/intake per chart review since approximately April 2019 [place](unsure of pt's health status prior).  Of note, today, pt endorsed Left sided weakness, including oral weakness, post his CVA last November 2018. Pt's speech is adequate, intelligible but w/ noted min Dysarthria.       SLP Plan  Continue with current plan of care(MBSS TBD)       Recommendations  Diet recommendations: Dysphagia 1 (puree);Nectar-thick liquid Liquids provided via: Teaspoon;Cup;No straw Medication Administration: Crushed with puree(as able) Supervision: Patient able to self feed;Staff to assist with self feeding;Intermittent supervision to cue for compensatory strategies Compensations: Minimize environmental distractions;Slow rate;Small sips/bites;Lingual sweep for clearance of pocketing;Multiple dry swallows after each bite/sip;Follow solids with liquid Postural Changes and/or Swallow Maneuvers: Seated upright 90 degrees;Upright 30-60 min after meal                General recommendations: (Dietician f/u) Oral Care  Recommendations: Oral care BID;Staff/trained caregiver to provide oral care Follow up Recommendations: Skilled Nursing facility SLP Visit  Diagnosis: Dysphagia, oropharyngeal phase (R13.12) Plan: Continue with current plan of care(MBSS TBD)       GO                [name], [place], CCC-SLP [name],[name] 05/04/2018, 11:05 AM

## 2018-05-04 NOTE — Progress Notes (Signed)
Chad [name]    MR#:  [id number]  DATE OF BIRTH:  1960/09/08  SUBJECTIVE:   Patient more awake alert answered most of my questions appropriately. Speech a little bit garbled. Patient tolerating nectar thick liquid and. Diet.  REVIEW OF SYSTEMS:   Review of Systems  Constitutional: Negative for chills, fever and weight loss.  HENT: Negative for ear discharge, ear pain and nosebleeds.   Eyes: Negative for blurred vision, pain and discharge.  Respiratory: Negative for sputum production, shortness of breath, wheezing and stridor.   Cardiovascular: Negative for chest pain, palpitations, orthopnea and PND.  Gastrointestinal: Negative for abdominal pain, diarrhea, nausea and vomiting.  Genitourinary: Negative for frequency and urgency.  Musculoskeletal: Negative for back pain and joint pain.  Neurological: Positive for focal weakness and weakness. Negative for sensory change and speech change.  Psychiatric/Behavioral: Negative for depression and hallucinations. The patient is not nervous/anxious.    Tolerating Diet:. Pureed with nectar thick liquid tolerating PT: pending  DRUG ALLERGIES:   Allergies  Allergen Reactions  . Penicillins Rash    VITALS:  Blood pressure 131/74, pulse 61, temperature 97.9 F (36.6 C), temperature source Oral, resp. rate 18, height 6' (1.829 m), weight 80.1 kg, SpO2 96 %.  PHYSICAL EXAMINATION:   Physical Exam  GENERAL:  57 y.o.-year-old patient lying in the bed with no acute distress. Looks older than his stated age EYES: Pupils equal, round, reactive to light and accommodation. No scleral icterus. Extraocular muscles intact.  HEENT: Head atraumatic, normocephalic. Oropharynx and nasopharynx clear.  NECK:  Supple, no jugular venous distention. No thyroid enlargement, no tenderness.  LUNGS: Normal breath sounds bilaterally, no wheezing, rales, rhonchi. No use of accessory  muscles of respiration.  CARDIOVASCULAR: S1, S2 normal. No murmurs, rubs, or gallops.  ABDOMEN: Soft, nontender, nondistended. Bowel sounds present. No organomegaly or mass.  EXTREMITIES: No cyanosis, clubbing or edema b/l.    NEUROLOGIC: Cranial nerves II through XII are intact. Patient has bilateral upper and lower extremity significant weakness more on the left and the right from previous stroke. PSYCHIATRIC:  patient is alert and oriented x 2.  SKIN: No obvious rash, lesion, or ulcer.   LABORATORY PANEL:  CBC Recent Labs  Lab 05/01/18 0547  WBC 9.0  HGB 9.4*  HCT 30.8*  PLT 191    Chemistries  Recent Labs  Lab 05/01/18 0547  NA 147*  K 3.8  CL 114*  CO2 27  GLUCOSE 120*  BUN 44*  CREATININE 1.17  CALCIUM 7.5*   Cardiac Enzymes No results for input(s): TROPONINI in the last 168 hours. RADIOLOGY:  No results found. ASSESSMENT AND PLAN:  Chad [name]  is a 57 y.o. male with a known history of anxiety, colon cancer status post hemicolectomy, depression, hyperlipidemia, recent stroke-lives in [place] and visiting his son over here.He had some complaint of shortness of breath last week and he is getting increasingly confused for last 1 week.  *Septic shock secondary to UTI.  Cultures negative.   Cefepime completed rx  *Right hydronephrosis.  Status post right ureteral stent. Pathology shows high-grade urothelial carcinoma--will need CT chest/abd/pelvis for staging once able to swallow for po contrast in the next few days -Foley placed draining well  *Acute hypoxic respiratory failure secondary to septic shock.  Extubated. Doing well  *Acute metabolic encephalopathy.  Multifactorial.  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 -appreciate Neuro  input -mentation improving -pt has had old CVA with left UE and LE weakness. Using walker at home per son  *Elevated LFTs Likely due to septic shock, continue to monitor. Patient  with chronic hepatitis C. improving  *Microcytic anemia he needs outpatient transfusions frequently.  Continue to transfuse to keep hemoglobin greater than 7  *Acute kidney injury Secondary to right hydronephrosis and septic shock. Improved  * Rhabdomyolysis reSolved  *COPD and chronic smoking Currently no exacerbation symptoms, may continue bronchodilators.  *Severe deconditioning /?critical illness myopathy/ late CVA deficits -PT consult placed -will benefit form rehab. Pt is total care at present -CSW consult placed. -?competency eval if  Needed  D/w son Chad [name] at length  Case discussed with Care Management/Social Worker. Management plans discussed with the patient, family and they are in agreement.  CODE STATUS: FULL  DVT Prophylaxis: SCD  TOTAL TIME TAKING CARE OF THIS PATIENT: *30* minutes.  >50% time spent on counselling and coordination of care  POSSIBLE D/C IN *few* DAYS, DEPENDING ON CLINICAL CONDITION.  Note: This dictation was prepared with Dragon dictation along with smaller phrase technology. Any transcriptional errors that result from this process are unintentional.  Fritzi Mandes M.D on 05/04/2018 at 12:20 PM  Between 7am to 6pm - Pager - [phone]  After 6pm go to [email or web address] - password [email or web address] [facility]  Office  [phone]  CC: Primary care physician; [name], MDPatient ID: Chad [name], male   DOB: 1960-12-31, 57 y.o.   MRN: [id number]

## 2018-05-04 NOTE — Progress Notes (Addendum)
Nutrition Follow-up  DOCUMENTATION CODES:   Severe malnutrition in context of chronic illness  INTERVENTION:  Provide Hormel Shake (Vital Cuisine) po TID with meals, each supplement provides 520 kcal and 22 grams of protein.  NUTRITION DIAGNOSIS:   Severe Malnutrition related to chronic illness(hx CVA, hx prostate cancer) as evidenced by severe fat depletion, moderate muscle depletion, severe muscle depletion.  Ongoing.  GOAL:   Patient will meet greater than or equal to 90% of their needs  Progressing.  MONITOR:   PO intake, Supplement acceptance, Labs, Weight trends, I & O's  REASON FOR ASSESSMENT:   Ventilator    ASSESSMENT:   57 year old male with PMHx of CVA, HLD, depression, anxiety, prostate cancer admitted with severe septic shock, acute pyelonephritis, severe right hydronephrosis s/p cytoscopy and right ureteral stent placement 9/29, severe respiratory failure requiring intubation on 9/29, renal failure, rhabdomyolysis.   -Patient was extubated on 10/2 and OGT was removed. -Patient found to have high-grade urothelial carcinoma, which is the etiology of the obstruction/hydronephrosis. -Plan was for placement of Dobbhoff tube on 10/4 for initiation of tube feeds as patient was not appropriate for PO intake at that time. However, tube was unable to be placed. -Following SLP evaluation on 10/7 diet was advanced to dysphagia 1 (puree) with nectar-thick liquids. -Per chart plan is for patient to discharge to [facility].  Patient tolerating diet well. He ate 90% of his breakfast this morning. Abdomen soft and patient is having regular bowel movements.   Medications reviewed and include: folic acid 1 mg daily PO, Protonix 40 mg daily PO, Seroquel 50 mg QHS PO, thiamine 100 mg daily IV, D5W stopped this AM.  Labs reviewed.  I/O: 1150 mL UOP yesterday (0.6 mL/kg/hr); 2 BM yesterday  Weight trend: 80.1 kg on 10/8; +11.7 kg from admission  Discussed with SLP  yesterday.  Diet Order:   Diet Order            DIET - DYS 1 Room service appropriate? Yes with Assist; Fluid consistency: Nectar Thick  Diet effective now              EDUCATION NEEDS:   No education needs have been identified at this time  Skin:  Skin Assessment: Reviewed RN Assessment(scattered petechiae)  Last BM:  05/03/2018 - small type 6  Height:   Ht Readings from Last 1 Encounters:  05/01/18 6' (1.829 m)    Weight:   Wt Readings from Last 1 Encounters:  05/04/18 80.1 kg    Ideal Body Weight:  80.9 kg  BMI:  Body mass index is 23.94 kg/m.  Estimated Nutritional Needs:   Kcal:  1850-2160 (MSJ x 1.2-1.4)  Protein:  95-110 grams (1.4-1.6 grams/kg)  Fluid:  2-2.4 L/day (30-35 mL/kg)  Willey Blade, MS, RD, LDN Office: [phone] Pager: [phone] After Hours/Weekend Pager: [phone]

## 2018-05-04 NOTE — Progress Notes (Addendum)
Clinical Social Worker (CSW) met with patient this morning to discuss going to [facility]. Per patient he talked with his son [name] last night and is agreeable to D/C to [facility]. Patient's son [name] is aware of above. [facility] admissions coordinator at [facility] is aware of above. PASARR is still pending. CSW made [facility] liaison aware of the need for outpatient palliative.   [employer], LCSW [phone]

## 2018-05-05 LAB — RHEUMATOID FACTOR: RHEUMATOID FACTOR: 243.1 [IU]/mL — AB (ref 0.0–13.9)

## 2018-05-05 MED ORDER — VITAMIN B-1 100 MG PO TABS
100.0000 mg | ORAL_TABLET | Freq: Every day | ORAL | Status: DC
  Administered 2018-05-06 – 2018-05-07 (×2): 100 mg via ORAL
  Filled 2018-05-05 (×2): qty 1

## 2018-05-05 MED ORDER — TRAMADOL HCL 50 MG PO TABS: 50.0000 mg | ORAL_TABLET | Freq: Four times a day (QID) | ORAL | Status: DC | PRN

## 2018-05-05 MED ORDER — MORPHINE SULFATE (PF) 2 MG/ML IV SOLN: 1.0000 mg | Freq: Four times a day (QID) | INTRAVENOUS | Status: DC | PRN

## 2018-05-05 MED ORDER — ACETAMINOPHEN 325 MG PO TABS: 650.0000 mg | ORAL_TABLET | Freq: Four times a day (QID) | ORAL | Status: DC | PRN

## 2018-05-05 NOTE — Progress Notes (Signed)
Pt's IV infiltrated. IV team attempted to place new IV with no success. MD Sudini notified and ok with pt not having IV at this time. Pt alert and up in chair. No complaints from pt at this time.

## 2018-05-05 NOTE — Care Management (Signed)
Barrier to discharge- pasarr pending- level 2

## 2018-05-05 NOTE — Plan of Care (Signed)

## 2018-05-05 NOTE — Progress Notes (Signed)
Speech Language Pathology Treatment: Dysphagia  Patient Details Name: Chad [name] MRN: [id number] DOB: Dec 11, 1960 Today's Date: 05/05/2018 Time: 1250-1330 SLP Time Calculation (min) (ACUTE ONLY): 40 min  Assessment / Plan / Recommendation Clinical Impression  Pt seen for ongoing assessment of swallowing and toleration of diet. Pt is more engaged today and is sitting up in his chair feeding himself his lunch meal. Noted pt does not use his LUE to help feed himself - pt stated he had his stroke last November 2018 and he has had Left sided weakness since them including oral weakness. Min+ Dysarthric speech is apparent. Overall, pt is improved from his presentation at the BSE.  Pt consumed po trials of Puree foods and Nectar consistency liquids via Cup w/ no overt deficits noted per NSG report; inconsistent cough noted upon trial attempts w/ the Magic Cup w/ SLP - unsure if d/t increased phlegm of the substance(?). Overall, he exhibited improved oral awareness, bolus management, and swallowing. Educated pt on slowing down, taking a break b/t trials, and using a f/u, DRY swallow to aid clearing any pharyngeal residue from the boluses. During the Oral phase, pt exhibited min prolonged bolus management w/ continued Left oral residue and slight-min loss from Left corner of mouth - pt did not seem fully aware of the residue. Pt was educated on lingual sweeping and f/u, DRY swallows. Pt was then given trials of Thin liquids via Cup following aspiration precautions - pt fed self holding the Cup using small, single sips. Delayed, overt coughing noted toward end of trials - much delayed. Pt will benefit from a MBSS. Pt's presentation and toleration of diet continue to appear improved since BSE.  Recommend continue w/ current diet of Dysphagia level 1 w/ NECTAR consistency liquids; strict aspiration precautions including lingual sweeping and DRY swallows to aid oral clearing. Pt does exhibit Left oral weakness  during po intake resulting in Left oral residue, loss. 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 Recommend continued f/u w/ ST services for ongoing assessment of toleration of oral intake; OMEs. Recommend a MBSS tomorrow for further objective assessment of swallowing. NSG updated.    HPI HPI: Pt is a 57 y.o. male with a known history of anxiety, colon cancer status post hemicolectomy, depression, hyperlipidemia, stroke w/ Left sided weakness(prior in 05/2017 per pt when living in [place]) now living/visiting his son. He had some complaint of shortness of breath last week and he is getting increasingly confused for last 1 week. Brought to the emergency room and intubated because he was septic and lethargic. Pt was extubated on 04/29/2018 and has been tolerating this well. Pt seems to have a declining health status including decreased oral interest/intake per chart review since approximately April 2019 when [place](unsure of pt's health status prior).  Of note, today, pt endorsed Left sided weakness, including oral weakness, post his CVA last November 2018. Pt's speech is adequate, intelligible but w/ noted min Dysarthria.       SLP Plan  Continue with current plan of care(MBSS)       Recommendations  Diet recommendations: Dysphagia 1 (puree);Nectar-thick liquid Liquids provided via: Teaspoon;Cup;No straw Medication Administration: Crushed with puree(as able) Supervision: Patient able to self feed;Staff to assist with self  feeding;Intermittent supervision to cue for compensatory strategies Compensations: Minimize environmental distractions;Slow rate;Small sips/bites;Lingual sweep for clearance of pocketing;Multiple dry  swallows after each bite/sip;Follow solids with liquid Postural Changes and/or Swallow Maneuvers: Seated upright 90 degrees;Upright 30-60 min after meal                General recommendations: (Dietician f/u) Oral Care Recommendations: Oral care BID;Staff/trained caregiver to provide oral care Follow up Recommendations: Skilled Nursing facility SLP Visit Diagnosis: Dysphagia, oropharyngeal phase (R13.12) Plan: Continue with current plan of care(MBSS)       GO                [name], MS, CCC-SLP [name],[name] 05/05/2018, 3:18 PM

## 2018-05-05 NOTE — Progress Notes (Signed)
[facility] [place] NAME: Chad [name]    MR#:  [id number]  DATE OF BIRTH:  1961-07-26  SUBJECTIVE:   Sitting in a chair.  Afebrile. No pain  REVIEW OF SYSTEMS:   Review of Systems  Constitutional: Negative for chills, fever and weight loss.  HENT: Negative for ear discharge, ear pain and nosebleeds.   Eyes: Negative for blurred vision, pain and discharge.  Respiratory: Negative for sputum production, shortness of breath, wheezing and stridor.   Cardiovascular: Negative for chest pain, palpitations, orthopnea and PND.  Gastrointestinal: Negative for abdominal pain, diarrhea, nausea and vomiting.  Genitourinary: Negative for frequency and urgency.  Musculoskeletal: Negative for back pain and joint pain.  Neurological: Positive for focal weakness and weakness. Negative for sensory change and speech change.  Psychiatric/Behavioral: Negative for depression and hallucinations. The patient is not nervous/anxious.     DRUG ALLERGIES:   Allergies  Allergen Reactions  . Penicillins Rash    VITALS:  Blood pressure 133/82, pulse 61, temperature 97.7 F (36.5 C), temperature source Oral, resp. rate 19, height 6' (1.829 m), weight 78.9 kg, SpO2 96 %.  PHYSICAL EXAMINATION:   Physical Exam  GENERAL:  57 y.o.-year-old patient lying in the bed with no acute distress. Looks older than his stated age EYES: Pupils equal, round, reactive to light and accommodation. No scleral icterus. Extraocular muscles intact.  HEENT: Head atraumatic, normocephalic. Oropharynx and nasopharynx clear.  NECK:  Supple, no jugular venous distention. No thyroid enlargement, no tenderness.  LUNGS: Normal breath sounds bilaterally, no wheezing, rales, rhonchi. No use of accessory muscles of respiration.  CARDIOVASCULAR: S1, S2 normal. No murmurs, rubs, or gallops.  ABDOMEN: Soft, nontender, nondistended. Bowel sounds present. No organomegaly or mass.   EXTREMITIES: No cyanosis, clubbing or edema b/l.    NEUROLOGIC: Cranial nerves II through XII are intact. Patient has bilateral upper and lower extremity significant weakness more on the left and the right from previous stroke. PSYCHIATRIC:  patient is alert and awake SKIN: No obvious rash, lesion, or ulcer.   LABORATORY PANEL:  CBC Recent Labs  Lab 05/01/18 0547  WBC 9.0  HGB 9.4*  HCT 30.8*  PLT 191    Chemistries  Recent Labs  Lab 05/01/18 0547  NA 147*  K 3.8  CL 114*  CO2 27  GLUCOSE 120*  BUN 44*  CREATININE 1.17  CALCIUM 7.5*   Cardiac Enzymes No results for input(s): TROPONINI in the last 168 hours. RADIOLOGY:  No results found. ASSESSMENT AND PLAN:  Jaystin Mcgarvey  is a 57 y.o. male with a known history of anxiety, colon cancer status post hemicolectomy, depression, hyperlipidemia, recent stroke-lives in [place] and visiting his son over here.Admitted for weakness, confusion  * Septic shock secondary to UTI.    Cultures negative.     Cefepime - completed rx  * Right hydronephrosis.  Status post right ureteral stent Pathology shows high-grade urothelial carcinoma--will need CT chest/abd/pelvis for staging when able to drink contrast Foley placed draining well  * Acute hypoxic respiratory failure secondary to septic shock.  Extubated. Doing well  * Acute metabolic encephalopathy,  Multifactorial.  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 Using walker at home per son  * Elevated LFTs Likely due to septic shock, continue to monitor. Patient with chronic hepatitis C. improving  * Microcytic  anemia He needs outpatient transfusions frequently.  Continue to transfuse to keep hemoglobin greater than 7  * Acute kidney injury Secondary to right hydronephrosis and septic shock. Improved  *  Rhabdomyolysis resolved  *COPD and chronic smoking Currently no exacerbation symptoms, may continue bronchodilators.  *Severe deconditioning /?critical illness myopathy/ late CVA deficits SNF at discharge  Case discussed with Care Management/Social Worker.  Management plans discussed with the patient, family and they are in agreement.  CODE STATUS: FULL  DVT Prophylaxis: SCD  TOTAL TIME TAKING CARE OF THIS PATIENT: 30 minutes.   POSSIBLE D/C IN 1-2 DAYS, DEPENDING ON CLINICAL CONDITION.  Note: This dictation was prepared with Dragon dictation along with smaller phrase technology. Any transcriptional errors that result from this process are unintentional.  Neita Carp M.D on 05/05/2018 at 12:35 PM  Between 7am to 6pm - Pager - [phone]  After 6pm go to [email or web address] - password [email or web address] [facility]  Office  [phone]  CC: Primary care physician; [name], MDPatient ID: [name], male   DOB: 02-07-1961, 57 y.o.   MRN: [id number]

## 2018-05-05 NOTE — Progress Notes (Signed)
Physical Therapy Treatment Patient Details Name: Chad [name] MRN: [id number] DOB: [birth year] Today's Date: 05/05/2018    History of Present Illness Pt presented to ED with altered mental status and respiratory distress, was admitted for severe sepsis and intubated on 04/25/2018. Had cystoscopy with stent placement and foley cath. replacement procedure performed on 9/29. Required vent support on 9/30-10/1. On 10/2 pt's urine cytology was positive for high-grade urothelial carcinoma. Imaging showed potential ileus. PMH includes anxiety, depression, HDL, CA, CVA, and nerve pain.    PT Comments    Pt is very motivated today and highly agreeable to PT. He states "I want to walk to the cafeteria." Pt demonstrates greatly increased activity tolerance. He is able to perform ther-ex with dec assist (Min A). Pt educated on HEP, visual aid on white board. Pt is more independent with bed mobility only +1, still requires assist with trunk and LEs. Pt progressed to sit-to-stand today, Mod A, +2 physical. With VC for posterior chain activation was able to maintain standing balance with RW independently for approx 5 sec. Will continue to progress strength/mobility as able.   Follow Up Recommendations  SNF     Equipment Recommendations  Rolling walker with 5" wheels    Recommendations for Other Services       Precautions / Restrictions Precautions Precautions: None Restrictions Weight Bearing Restrictions: No    Mobility  Bed Mobility Overal bed mobility: Needs Assistance Bed Mobility: Supine to Sit     Supine to sit: Mod assist;+2 for physical assistance     General bed mobility comments: Pt continues to demonstrate ability to disassociate trunk, has improve ability to manage LE to EOB, still requires assist to initate trunk rotation and for dynamic trunk control.  Transfers Overall transfer level: Needs assistance Equipment used: Rolling walker (2 wheeled) Transfers: Sit to/from  [employer] Sit to Stand: +2 physical assistance;Mod assist   Squat pivot transfers: Max assist;+2 physical assistance(Performed at end of session when pt fatigued to chair.)     General transfer comment: Pt has fair anterior tranlation, poor control of LUE. VC used for sequencing. Pt has very flexed trunk in standing. Pt improves posterior chain control with each attempt (3 total).   Ambulation/Gait             General Gait Details: Pt fatiuged with transfer, did not progress to ambulation this date.   Stairs             Wheelchair Mobility    Modified Rankin (Stroke Patients Only)       Balance Overall balance assessment: Needs assistance Sitting-balance support: Single extremity supported;Feet supported Sitting balance-Leahy Scale: Fair Sitting balance - Comments: Can maintain sitting at EOB independently 2 minutes before fatigue. Pt posturing is flexed in thoracic and cervical spine, with VC could ext more, tends to lean posterolaterally toward R. Postural control: Posterior lean;Right lateral lean Standing balance support: Bilateral upper extremity supported Standing balance-Leahy Scale: Poor Standing balance comment: Maintains ~5 sec before LOB using RW for UE support.                            Cognition Arousal/Alertness: Awake/alert Behavior During Therapy: WFL for tasks assessed/performed Overall Cognitive Status: No family/caregiver present to determine baseline cognitive functioning  Exercises Other Exercises Other Exercises: Supine BLE: AROM ankle pumps and glute squeezes x15 reps. AAROM heel slides, isometric hip ADD, quad sets. Min A x10 reps. All ther-ex VC for technique.    General Comments General comments (skin integrity, edema, etc.): Pt has erythemia on L lateral malleous.      Pertinent Vitals/Pain Pain Assessment: No/denies pain    Home Living                       Prior Function            PT Goals (current goals can now be found in the care plan section) Acute Rehab PT Goals Patient Stated Goal: To be able to move again. PT Goal Formulation: With patient Time For Goal Achievement: 05/17/18 Potential to Achieve Goals: Fair Progress towards PT goals: Progressing toward goals    Frequency    Min 2X/week      PT Plan Current plan remains appropriate    Co-evaluation              AM-PAC PT "6 Clicks" Daily Activity  Outcome Measure  Difficulty turning over in bed (including adjusting bedclothes, sheets and blankets)?: Unable Difficulty moving from lying on back to sitting on the side of the bed? : Unable Difficulty sitting down on and standing up from a chair with arms (e.g., wheelchair, bedside commode, etc,.)?: Unable Help needed moving to and from a bed to chair (including a wheelchair)?: A Lot Help needed walking in hospital room?: Total Help needed climbing 3-5 steps with a railing? : Total 6 Click Score: 7    End of Session Equipment Utilized During Treatment: Gait belt Activity Tolerance: Patient tolerated treatment well(Fatigued at end of session) Patient left: in chair;with call bell/phone within reach;with chair alarm set Nurse Communication: Mobility status;Other (comment)(Potential bed sore developing on L lateral malleous) PT Visit Diagnosis: Unsteadiness on feet (R26.81);Muscle weakness (generalized) (M62.81);Hemiplegia and hemiparesis Hemiplegia - Right/Left: Left Hemiplegia - dominant/non-dominant: Non-dominant Hemiplegia - caused by: Cerebral infarction     Time: 1020-1102 PT Time Calculation (min) (ACUTE ONLY): 42 min  Charges:                        Algis Downs, SPT   Algis Downs 05/05/2018, 12:53 PM

## 2018-05-05 NOTE — Progress Notes (Signed)
[facility] followed up with Pt from previous OR for an AD. Pt is able to discuss his life and things that are important to him. Chaplain asked Pt if he is comfortable signing AD. He said it mad him feel like he was leaving. Chaplain explained AD but it offered little comfort to the Pt. Chaplain practiced active listen and reflective questioning as the patient talked about how important his dad was to him. He said 6 months after his dad filled out paperwork his dad died. He discussed  His relationships and his falling. Chaplain let him know he has the power to decide what he wants and if he wants to fill out the AD. He seemed to find relief in hearing that. Chaplain encouraged him to talk to his son about what he wants to happen in the event of his dying. Patient said he would.    05/05/18 1300  Clinical Encounter Type  Visited With Patient  Visit Type Follow-up  Referral From Chaplain  Spiritual Encounters  Spiritual Needs Brochure;Prayer

## 2018-05-05 NOTE — Progress Notes (Addendum)
PASARR is still pending. MD and RN aware of above. PASARR screener contacted Clinical Social Worker (CSW) and stated that she would come out to evaluate patient today.   [employer], LCSW [phone]

## 2018-05-05 NOTE — Plan of Care (Signed)
  Problem: Education: Goal: Knowledge of General Education information will improve Description Including pain rating scale, medication(s)/side effects and non-pharmacologic comfort measures Outcome: Progressing   

## 2018-05-06 ENCOUNTER — Inpatient Hospital Stay: Payer: Medicaid Other

## 2018-05-06 LAB — CRYOGLOBULIN

## 2018-05-06 NOTE — Progress Notes (Addendum)
Per Dereck Leep screener she came yesterday and evaluated patient. Per Magda Paganini she will submit her report to [facility] and then the state will have to review it. Per MD patient will follow up outpatient with the urologist for his bladder cancer. Clinical Education officer, museum (CSW) made [employer] [facility] [employer] aware of above. Patient's son [name] is aware of above.   [employer], LCSW [phone]

## 2018-05-06 NOTE — Progress Notes (Signed)
Physical Therapy Treatment Patient Details Name: Chad [name] MRN: [id number] DOB: January 01, 1961 Today's Date: 05/06/2018    History of Present Illness Pt presented to ED with altered mental status and respiratory distress, was admitted for severe sepsis and intubated on 04/25/2018. Had cystoscopy with stent placement and foley cath. replacement procedure performed on 9/29. Required vent support on 9/30-10/1. On 10/2 pt's urine cytology was positive for high-grade urothelial carcinoma. Imaging showed potential ileus. PMH includes anxiety, depression, HDL, CA, CVA, and nerve pain.    PT Comments    Pt continues to progress toward goals demonstrating increased tolerance with ther-ex and sitting balance. Sitting at EOB pt uses RUE for support in various locations: bed handle on his R side, L knee, and behind his back. Stability in sitting balance is relatively equal with R hand in various locations. Pt continues to demonstrate ability to sit-to-stand with VC for improved body mechanics. D/t toileting needs pt could not progress to more pre-gait activities. Will continue to progress mobility/strength as able.  Follow Up Recommendations  SNF     Equipment Recommendations  Rolling walker with 5" wheels    Recommendations for Other Services       Precautions / Restrictions Precautions Precautions: None Restrictions Weight Bearing Restrictions: No    Mobility  Bed Mobility Overal bed mobility: Needs Assistance Bed Mobility: Supine to Sit Rolling: Mod assist   Supine to sit: Mod assist;+2 for physical assistance Sit to supine: Max assist;+2 for physical assistance   General bed mobility comments: Pt continues to demonstrate ability to disassociate trunk, has improve ability to manage LE to EOB, still requires assist to initate trunk rotation and for dynamic trunk control.  Transfers Overall transfer level: Needs assistance Equipment used: Rolling walker (2 wheeled) Transfers: Sit  to/from Stand Sit to Stand: Mod assist;+2 physical assistance         General transfer comment: Pt continues to have fair anterior translation and poor control of LUE requiring phyiscal assist to hold on to RW with L hand. With VC pt has improve anterior translation. Noted buckling of BLE in stance. Tactile cue given to activate quads, pt can correct, but fatigues quickly.  Ambulation/Gait             General Gait Details: Pt fatiuged with transfer, did not progress to ambulation this date.   Stairs             Wheelchair Mobility    Modified Rankin (Stroke Patients Only)       Balance Overall balance assessment: Needs assistance Sitting-balance support: Single extremity supported;Feet supported Sitting balance-Leahy Scale: Fair Sitting balance - Comments: Maintains quiet sitting at EOB independently >3 minutes. Greatly flexed in thoracic and cervical spine. With VC can correct for short period, then fatigues.   Standing balance support: Bilateral upper extremity supported Standing balance-Leahy Scale: Poor Standing balance comment: Maintains ~5 sec before LOB using RW for UE support.                            Cognition Arousal/Alertness: Awake/alert Behavior During Therapy: WFL for tasks assessed/performed Overall Cognitive Status: Within Functional Limits for tasks assessed                                        Exercises Other Exercises Other Exercises: Supine BLE: AROM ankle pumps, glute sets, and quad  sets. AAROM heel slides and SAQs with Min A. All ther-ex performed 15 reps with VC for technique. Other Exercises: Sitting balance at EOB >3 minutes Other Exercises: Bed mobility for bed pan placement.    General Comments        Pertinent Vitals/Pain Pain Assessment: No/denies pain    Home Living                      Prior Function            PT Goals (current goals can now be found in the care plan section)  Acute Rehab PT Goals Patient Stated Goal: To be able to move again. PT Goal Formulation: With patient Time For Goal Achievement: 05/17/18 Potential to Achieve Goals: Fair Progress towards PT goals: Progressing toward goals    Frequency    Min 2X/week      PT Plan Current plan remains appropriate    Co-evaluation              AM-PAC PT "6 Clicks" Daily Activity  Outcome Measure  Difficulty turning over in bed (including adjusting bedclothes, sheets and blankets)?: Unable Difficulty moving from lying on back to sitting on the side of the bed? : Unable Difficulty sitting down on and standing up from a chair with arms (e.g., wheelchair, bedside commode, etc,.)?: Unable Help needed moving to and from a bed to chair (including a wheelchair)?: A Lot Help needed walking in hospital room?: Total Help needed climbing 3-5 steps with a railing? : Total 6 Click Score: 7    End of Session Equipment Utilized During Treatment: Gait belt Activity Tolerance: Patient tolerated treatment well Patient left: in bed;with call bell/phone within reach;with bed alarm set;with nursing/sitter in room Nurse Communication: Mobility status;Other (comment) PT Visit Diagnosis: Unsteadiness on feet (R26.81);Muscle weakness (generalized) (M62.81);Hemiplegia and hemiparesis Hemiplegia - Right/Left: Left Hemiplegia - dominant/non-dominant: Non-dominant Hemiplegia - caused by: Cerebral infarction     Time: 4825-0037 PT Time Calculation (min) (ACUTE ONLY): 27 min  Charges:                        Algis Downs, SPT 05/06/2018, 1:37 PM

## 2018-05-06 NOTE — Progress Notes (Signed)
[facility] NAME: Chad [name]    MR#:  [id number]  DATE OF BIRTH:  1961-04-13  SUBJECTIVE:   Feels weak. Wants to smoke  REVIEW OF SYSTEMS:   Review of Systems  Constitutional: Negative for chills, fever and weight loss.  HENT: Negative for ear discharge, ear pain and nosebleeds.   Eyes: Negative for blurred vision, pain and discharge.  Respiratory: Negative for sputum production, shortness of breath, wheezing and stridor.   Cardiovascular: Negative for chest pain, palpitations, orthopnea and PND.  Gastrointestinal: Negative for abdominal pain, diarrhea, nausea and vomiting.  Genitourinary: Negative for frequency and urgency.  Musculoskeletal: Negative for back pain and joint pain.  Neurological: Positive for focal weakness and weakness. Negative for sensory change and speech change.  Psychiatric/Behavioral: Negative for depression and hallucinations. The patient is not nervous/anxious.     DRUG ALLERGIES:   Allergies  Allergen Reactions  . Penicillins Rash    VITALS:  Blood pressure 139/72, pulse (!) 59, temperature 99.1 F (37.3 C), temperature source Oral, resp. rate 16, height 6' (1.829 m), weight 76.1 kg, SpO2 100 %.  PHYSICAL EXAMINATION:   Physical Exam  GENERAL:  57 y.o.-year-old patient lying in the bed with no acute distress. Looks older than his stated age EYES: Pupils equal, round, reactive to light and accommodation. No scleral icterus. Extraocular muscles intact.  HEENT: Head atraumatic, normocephalic. Oropharynx and nasopharynx clear.  NECK:  Supple, no jugular venous distention. No thyroid enlargement, no tenderness.  LUNGS: Normal breath sounds bilaterally, no wheezing, rales, rhonchi. No use of accessory muscles of respiration.  CARDIOVASCULAR: S1, S2 normal. No murmurs, rubs, or gallops.  ABDOMEN: Soft, nontender, nondistended. Bowel sounds present. No organomegaly or mass.  EXTREMITIES:  No cyanosis, clubbing or edema b/l.    NEUROLOGIC: Cranial nerves II through XII are intact. Patient has bilateral upper and lower extremity significant weakness more on the left and the right from previous stroke. PSYCHIATRIC:  patient is alert and awake SKIN: No obvious rash, lesion, or ulcer.   LABORATORY PANEL:  CBC Recent Labs  Lab 05/01/18 0547  WBC 9.0  HGB 9.4*  HCT 30.8*  PLT 191    Chemistries  Recent Labs  Lab 05/01/18 0547  NA 147*  K 3.8  CL 114*  CO2 27  GLUCOSE 120*  BUN 44*  CREATININE 1.17  CALCIUM 7.5*   Cardiac Enzymes No results for input(s): TROPONINI in the last 168 hours. RADIOLOGY:  No results found. ASSESSMENT AND PLAN:  Chad [name]  is a 57 y.o. male with a known history of anxiety, colon cancer status post hemicolectomy, depression, hyperlipidemia, recent stroke-lives in [place] and visiting his son over here.Admitted for weakness, confusion  * Dysphagia Chronic and some acute issues MBS with sign of aspiration with thin liquids  * Septic shock secondary to UTI.    Cultures negative.     Cefepime - completed rx  * Right hydronephrosis.  Status post right ureteral stent Pathology shows high-grade urothelial carcinoma--will need CT chest/abd/pelvis for staging when able to drink contrast Foley placed draining well  * Acute hypoxic respiratory failure secondary to septic shock.  Extubated. Doing well  * Acute metabolic encephalopathy,  Multifactorial.  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 Using walker at home per son  * Elevated LFTs Likely due to  septic shock, continue to monitor. Patient with chronic hepatitis C. improving  * Microcytic anemia He needs outpatient transfusions frequently.  Continue to transfuse to keep hemoglobin greater than 7  * Acute kidney injury Secondary to  right hydronephrosis and septic shock. Improved  * Rhabdomyolysis resolved  *COPD and chronic smoking Currently no exacerbation symptoms, may continue bronchodilators.  *Severe deconditioning /?critical illness myopathy/ late CVA deficits SNF at discharge  Case discussed with Care Management/Social Worker.  Management plans discussed with the patient, family and they are in agreement.  CODE STATUS: FULL  DVT Prophylaxis: SCD  TOTAL TIME TAKING CARE OF THIS PATIENT: 30 minutes.   POSSIBLE D/C IN 1-2 DAYS, DEPENDING ON CLINICAL CONDITION.  Note: This dictation was prepared with Dragon dictation along with smaller phrase technology. Any transcriptional errors that result from this process are unintentional.  Neita Carp M.D on 05/06/2018 at 3:33 PM  Between 7am to 6pm - Pager - [phone]  After 6pm go to [email or web address] - password [email or web address] [facility]  Office  [phone]  CC: Primary care physician; [name], MDPatient ID: Chad [name], male   DOB: 1960-11-16, 57 y.o.   MRN: [id number]

## 2018-05-06 NOTE — Evaluation (Addendum)
Objective Swallowing Evaluation: Type of Study: MBS-Modified Barium Swallow Study   Patient Details  Name: Chad [name] MRN: [id number] Date of Birth: 05/15/1961  Today's Date: 05/06/2018 Time: SLP Start Time (ACUTE ONLY): 1100 -SLP Stop Time (ACUTE ONLY): 1200  SLP Time Calculation (min) (ACUTE ONLY): 60 min   Past Medical History:  Past Medical History:  Diagnosis Date  . Anxiety   . Cancer [facility])    prostate  . Depression   . Hyperlipidemia   . Nerve pain   . Stroke [facility]    Past Surgical History:  Past Surgical History:  Procedure Laterality Date  . COLON SURGERY     En bloc extended right hemicolectomy 07/2017  . CYSTOSCOPY WITH STENT PLACEMENT Right 04/25/2018   Procedure: CYSTOSCOPY WITH STENT PLACEMENT;  Surgeon: Hollice Espy, MD;  Location: [facility] ORS;  Service: Urology;  Laterality: Right;  . tumor removed      HPI: Pt is a 57 y.o. male with a known history of anxiety, colon cancer status post hemicolectomy, depression, hyperlipidemia, stroke w/ Left sided weakness(prior in 05/2017 per pt when living in [place]) now living/visiting his son. He had some complaint of shortness of breath last week and he is getting increasingly confused for last 1 week. Brought to the emergency room and intubated because he was septic and lethargic. Pt was extubated on 04/29/2018 and has been tolerating this well. Pt seems to have a declining health status including decreased oral interest/intake per chart review since approximately April 2019 when [place](unsure of pt's health status prior).  Of note, today, pt endorsed Left sided weakness, including oral weakness, post his CVA last November 2018. Pt's speech is adequate, intelligible but w/ noted min+ Dysarthria.    Subjective: pt awake, verbally responsive. Appeared more engaged w/ staff. Speech is Dysarthria - baseline from CVA ~1 yer ago?    Assessment / Plan / Recommendation  CHL IP CLINICAL IMPRESSIONS  05/06/2018  Clinical Impression Pt appeared to present w/ Moderate Oropharyngeal phase dysphagia w/ Moderate delay in pharyngeal swallow initiation resulting in fairly consistent Laryngeal Penetration w/ Thin liquids via Cup - after 3 trials, buildup of liquid residue was noted (to remain) in the Laryngeal Vestible. Pt did NOT respond to the Laryngeal Penetration w/ throat clearing or coughing - it was SILENT. Of note, pt's cough and throat clearing are reduced in effort thus may not be effective for full airway protection. Pt exhibited a more timely pharyngeal swallow initiation w/ trials of Nectar consistency liquids via Cup allowing for more complete airway closure before/during the swallow. W/ the Thin liquids, the liquid spilled fully to the Pyriform Sinuses filling them allowing for more contact of the residue to the underneath side of the Epiglottis. Post swallowing, pt exhibited pharyngeal residue in the Valleculae, aeryepiglottic folds, and Pyriform Sinuses indicating decreased pharyngeal pressure and laryngeal excursion during the swallow. Pt was able to use the strategy of a Dry, f/u swallow to reduce the pharyngeal residue - it did not clear the residue however. Similar pharyngeal residue was noted w/ the trials of puree and soft solids broken down w/ need for Dry, f/u swallow to reduce the pharyngeal residue. Purees/Solids triggered the pharyngeal swallow at the level of the Valleculae (timely).  During the Oral phase, pt exhibited min decreased bolus control fo the liquids w/ premature spillage into the pharynx; BOT residue was noted along w/ min oral residue w/ increased texture trials - pt was able to use the  Dry, f/u swallow strategy to reduce oral residue. Pt is also missing most dentition w/ only a few front dentition effective for mastication of solid trials.  Pt appears to be at risk for Aspiration from Thin liquids and pharyngeal residue post the swallow thus impact on the Pulmonary status w/  potential decline, pneumonia. Unsure if pt's Dysphagia was a result of the R CVA in 05/2017. Pt has been dx'd w/ Severe malnutrition per Dietitian.   SLP Visit Diagnosis Dysphagia, oropharyngeal phase (R13.12)  Attention and concentration deficit following --  Frontal lobe and executive function deficit following --  Impact on safety and function Moderate aspiration risk      CHL IP TREATMENT RECOMMENDATION 05/06/2018  Treatment Recommendations Therapy as outlined in treatment plan below     Prognosis 05/06/2018  Prognosis for Safe Diet Advancement Fair  Barriers to Reach Goals Baseline R CVA in 05/2017  Barriers/Prognosis Comment (Unsure of his functional deficits post that stroke - dysphagia, dysarthria?)    CHL IP DIET RECOMMENDATION 05/06/2018  SLP Diet Recommendations Dysphagia 2 (MINCED foods); Nectar thick liquid - per MD discussion  Liquid Administration via Cup;No straw  Medication Administration Whole meds with puree  Compensations Minimize environmental distractions;Slow rate;Small sips/bites;Lingual sweep for clearance of pocketing;Multiple dry swallows after each bite/sip;Follow solids with liquid  Postural Changes Remain semi-upright after after feeds/meals (Comment);Seated upright at 90 degrees      CHL IP OTHER RECOMMENDATIONS 05/06/2018  Recommended Consults (No Data)  Oral Care Recommendations Oral care BID;Staff/trained caregiver to provide oral care  Other Recommendations Order thickener from pharmacy;Prohibited food (jello, ice cream, thin soups);Remove water pitcher;Have oral suction available      CHL IP FOLLOW UP RECOMMENDATIONS 05/06/2018  Follow up Recommendations Skilled Nursing facility      [facility] IP FREQUENCY AND DURATION 05/06/2018  Speech Therapy Frequency (ACUTE ONLY) min 3x week  Treatment Duration 2 weeks           CHL IP ORAL PHASE 05/06/2018  Oral Phase Impaired  Oral - Pudding Teaspoon NT  Oral - Pudding Cup --  Oral - Honey Teaspoon NT   Oral - Honey Cup --  Oral - Nectar Teaspoon --  Oral - Nectar Cup 4 trials  Oral - Nectar Straw --  Oral - Thin Teaspoon --  Oral - Thin Cup 3 trials  Oral - Thin Straw --  Oral - Puree 2 trials  Oral - Mech Soft 2 trials  Oral - Regular NT  Oral - Multi-Consistency --  Oral - Pill NT  Oral Phase - Comment --    CHL IP PHARYNGEAL PHASE 05/06/2018  Pharyngeal Phase Impaired  Pharyngeal- Pudding Teaspoon NT  Pharyngeal --  Pharyngeal- Pudding Cup --  Pharyngeal --  Pharyngeal- Honey Teaspoon NT  Pharyngeal --  Pharyngeal- Honey Cup --  Pharyngeal --  Pharyngeal- Nectar Teaspoon --  Pharyngeal --  Pharyngeal- Nectar Cup 4 trials  Pharyngeal --  Pharyngeal- Nectar Straw --  Pharyngeal --  Pharyngeal- Thin Teaspoon --  Pharyngeal --  Pharyngeal- Thin Cup 3 trials  Pharyngeal --  Pharyngeal- Thin Straw --  Pharyngeal --  Pharyngeal- Puree 2 trials  Pharyngeal --  Pharyngeal- Mechanical Soft 2 trials  Pharyngeal --  Pharyngeal- Regular NT  Pharyngeal --  Pharyngeal- Multi-consistency --  Pharyngeal --  Pharyngeal- Pill NT  Pharyngeal --  Pharyngeal Comment --     CHL IP CERVICAL ESOPHAGEAL PHASE 05/06/2018  Cervical Esophageal Phase WFL - what was viewable  Pudding Teaspoon --  Pudding Cup --  Honey Teaspoon --  Honey Cup --  Nectar Teaspoon --  Nectar Cup --  Nectar Straw --  Thin Teaspoon --  Thin Cup --  Thin Straw --  Puree --  Mechanical Soft --  Regular --  Multi-consistency --  Pill --  Cervical Esophageal Comment --       [name], MS, CCC-SLP [name],[name] 05/06/2018, 12:19 PM

## 2018-05-07 ENCOUNTER — Inpatient Hospital Stay: Payer: Medicaid Other

## 2018-05-07 ENCOUNTER — Encounter: Payer: Self-pay | Admitting: Radiology

## 2018-05-07 ENCOUNTER — Telehealth: Payer: Self-pay | Admitting: Urology

## 2018-05-07 MED ORDER — IOPAMIDOL (ISOVUE-300) INJECTION 61%
100.0000 mL | Freq: Once | INTRAVENOUS | Status: AC | PRN
  Administered 2018-05-07: 100 mL via INTRAVENOUS

## 2018-05-07 MED ORDER — PANTOPRAZOLE SODIUM 40 MG PO PACK: 40.0000 mg | PACK | Freq: Every day | ORAL | Status: DC

## 2018-05-07 NOTE — Progress Notes (Signed)
EMS called for transport.

## 2018-05-07 NOTE — Progress Notes (Signed)
Physical Therapy Treatment Patient Details Name: Chad [name] MRN: [id number] DOB: [birth year] Today's Date: 05/07/2018    History of Present Illness Pt presented to ED with altered mental status and respiratory distress, was admitted for severe sepsis and intubated on 04/25/2018. Had cystoscopy with stent placement and foley cath. replacement procedure performed on 9/29. Required vent support on 9/30-10/1. On 10/2 pt's urine cytology was positive for high-grade urothelial carcinoma. Imaging showed potential ileus. PMH includes anxiety, depression, HDL, CA, CVA, and nerve pain.    PT Comments    Pt is agreeable to PT. Pt performs bed mobility with Mod A +2, with less VC for sequencing. Challenged pt with increased duration in standing during sit to stand transfers. He requires occasional blocking of RLE d/t buckling, but otherwise tolerated standing well. Stand pivot transfer performed to chair with Max A, +2 d/t pt fatigue at end of session. Pt would benefit from a platform RW for mobility d/t decreased control in the UE. Will continue to progress pt as able.  Follow Up Recommendations  SNF     Equipment Recommendations  Rolling walker with 5" wheels    Recommendations for Other Services       Precautions / Restrictions Precautions Precautions: None Restrictions Weight Bearing Restrictions: No    Mobility  Bed Mobility Overal bed mobility: Needs Assistance Bed Mobility: Supine to Sit     Supine to sit: Mod assist;+2 for physical assistance     General bed mobility comments: Pt continues to demonstrate ability to disassociate trunk, has improve ability to manage LE to EOB, still requires assist to initate trunk rotation and for dynamic trunk control.  Transfers Overall transfer level: Needs assistance Equipment used: Rolling walker (2 wheeled) Transfers: Sit to/from [facility] Sit to Stand: Mod assist;+2 physical assistance Stand pivot transfers: Max  assist;+2 physical assistance       General transfer comment: Pt performed 5 attempts, each attempt holding standing position inc duration ranging 5-90 sec. Pt needed occassional blocking at RLE/tactile cueing to engage quadriceps. Attempts required pts full exhersion and long rest breaks after each attempt.  Ambulation/Gait             General Gait Details: Pt fatiuged with transfers, did not progress to ambulation this date.   Stairs             Wheelchair Mobility    Modified Rankin (Stroke Patients Only)       Balance Overall balance assessment: Needs assistance Sitting-balance support: Single extremity supported;Feet supported Sitting balance-Leahy Scale: Fair Sitting balance - Comments: Maintains quiet sitting at EOB independently >4 minutes. Greatly flexed in thoracic and cervical spine. With VC can correct for short period, then fatigues.   Standing balance support: Bilateral upper extremity supported Standing balance-Leahy Scale: Poor Standing balance comment: Maintains ~5 sec before LOB using RW for UE support.                            Cognition Arousal/Alertness: Awake/alert Behavior During Therapy: WFL for tasks assessed/performed Overall Cognitive Status: Within Functional Limits for tasks assessed                                        Exercises Other Exercises Other Exercises: Supine BLE: AROM ankle pumps, glute sets, and quad sets. AAROM heel slides and SAQs with Min A. All ther-ex  performed 15 reps with VC for technique. Other Exercises: Sitting balance at EOB >4 minutes, and 10 reps marching in place with Mod A.    General Comments        Pertinent Vitals/Pain Pain Assessment: No/denies pain    Home Living                      Prior Function            PT Goals (current goals can now be found in the care plan section) Acute Rehab PT Goals Patient Stated Goal: To be able to move again. PT Goal  Formulation: With patient Time For Goal Achievement: 05/17/18 Potential to Achieve Goals: Fair Progress towards PT goals: Progressing toward goals    Frequency    Min 2X/week      PT Plan Current plan remains appropriate    Co-evaluation              AM-PAC PT "6 Clicks" Daily Activity  Outcome Measure  Difficulty turning over in bed (including adjusting bedclothes, sheets and blankets)?: Unable Difficulty moving from lying on back to sitting on the side of the bed? : Unable Difficulty sitting down on and standing up from a chair with arms (e.g., wheelchair, bedside commode, etc,.)?: Unable Help needed moving to and from a bed to chair (including a wheelchair)?: A Lot Help needed walking in hospital room?: Total Help needed climbing 3-5 steps with a railing? : Total 6 Click Score: 7    End of Session Equipment Utilized During Treatment: Gait belt Activity Tolerance: Patient limited by fatigue Patient left: in bed;with call bell/phone within reach;with bed alarm set;with nursing/sitter in room Nurse Communication: Mobility status;Other (comment) PT Visit Diagnosis: Unsteadiness on feet (R26.81);Muscle weakness (generalized) (M62.81);Hemiplegia and hemiparesis Hemiplegia - Right/Left: Left Hemiplegia - dominant/non-dominant: Non-dominant Hemiplegia - caused by: Cerebral infarction     Time: 7943-2761 PT Time Calculation (min) (ACUTE ONLY): 35 min  Charges:                       Algis Downs, SPT 05/07/2018, 11:12 AM

## 2018-05-07 NOTE — Progress Notes (Signed)
Called report to [facility]. Once CT complete, will call EMS for transport

## 2018-05-07 NOTE — Telephone Encounter (Signed)
This patient will be leaving the hospital in the near future.  He needs to see me next week as an outpatient.  Please reschedule his follow-up.  He will no longer has a catheter and does not need a voiding trial with a nurse.  Hollice Espy, MD

## 2018-05-07 NOTE — Clinical Social Work Placement (Signed)
   CLINICAL SOCIAL WORK PLACEMENT  NOTE  Date:  05/07/2018  Patient Details  Name: Chad [name] MRN: [id number] Date of Birth: 11-Oct-1960  Clinical Social Work is seeking post-discharge placement for this patient at the [place] level of care (*CSW will initial, date and re-position this form in  chart as items are completed):  Yes   Patient/family provided with [facility] Work Department's list of facilities offering this level of care within the geographic area requested by the patient (or if unable, by the patient's family).  Yes   Patient/family informed of their freedom to choose among providers that offer the needed level of care, that participate in Medicare, Medicaid or managed care program needed by the patient, have an available bed and are willing to accept the patient.  Yes   Patient/family informed of [facility]'s ownership interest in [facility] and [facility], as well as of the fact that they are under no obligation to receive care at these facilities.  PASRR submitted to EDS on 05/03/18     PASRR number received on 05/07/18     Existing PASRR number confirmed on       FL2 transmitted to all facilities in geographic area requested by pt/family on 05/03/18     FL2 transmitted to all facilities within larger geographic area on 05/03/18     Patient informed that his/her managed care company has contracts with or will negotiate with certain facilities, including the following:        Yes   Patient/family informed of bed offers received.  Patient chooses bed at [facility] )     Physician recommends and patient chooses bed at      Patient to be transferred to [facility] ) on 05/07/18.  Patient to be transferred to facility by [facility] )     Patient family notified on 05/07/18 of transfer.  Name of family member notified:  (Patient's son Chad [name] is aware of D/C today. )     PHYSICIAN    Additional Comment:    _______________________________________________ Zyen Triggs, Veronia Beets, LCSW 05/07/2018, 2:01 PM

## 2018-05-07 NOTE — Progress Notes (Signed)
Patient is medically stable for D/C to [facility] today. PASARR has been received and per [facility] admissions coordinator at [facility] patient can come today. RN will call report and arrange EMS for transport. Clinical Education officer, museum (CSW) sent D/C orders to [facility] via [place]. Patient will have outpatient palliative care. [facility] liaison is aware of above. Patient is aware of above. CSW contacted patient's son [name] and made him aware of above. Please reconsult if future social work needs arise. CSW signing off.   [employer], LCSW [phone]

## 2018-05-07 NOTE — Discharge Instructions (Signed)
Pureed diet and nectar thickened liquids

## 2018-05-07 NOTE — Discharge Summary (Addendum)
Chad [name] NAME: Chad [name]    MR#:  [id number]  DATE OF BIRTH:  1960-10-23  DATE OF ADMISSION:  04/25/2018 ADMITTING PHYSICIAN: Vaughan Basta, MD  DATE OF DISCHARGE: 05/07/2018  PRIMARY CARE PHYSICIAN: [name], MD   ADMISSION DIAGNOSIS:  Hydronephrosis [N13.30] Respiratory failure ([place]) [J96.90]  DISCHARGE DIAGNOSIS:  Principal Problem:   Severe sepsis ([place]) Active Problems:   Sepsis ([place])   Protein-calorie malnutrition, severe   Palliative care by specialist   Goals of care, counseling/discussion   Hydronephrosis   Urothelial cancer ([place])   Dysphagia   SECONDARY DIAGNOSIS:   Past Medical History:  Diagnosis Date  . Anxiety   . Cancer [facility])    prostate  . Depression   . Hyperlipidemia   . Nerve pain   . Stroke [facility])      ADMITTING HISTORY  HISTORY OF PRESENT ILLNESS: Chad [name]  is a 57 y.o. male with a known history of anxiety, colon cancer status post hemicolectomy, depression, hyperlipidemia, recent stroke-lives in [place] and visiting his son over here. He had some complaint of shortness of breath last week and he is getting increasingly confused for last 1 week.  Son had sent him to emergency room because of that complaint but when he came to ER he was little confused and his only complaint mentioned was left knee pain, which is chronic for last 3 years.  So he was sent home with some pain medications.  He continued to be short of breath and son went out last evening to meet his friends and returned home very late last night or early morning.  When he came he found the patient outside of the house and not responding.  He called EMS and they noted him to be hypotensive, hypothermic, acidotic in respiratory distress. Brought to the emergency room and intubated because he was septic and lethargic. Also noted to have elevated white blood cell count and low hemoglobin with elevated LFTs in  ER.  HOSPITAL COURSE:   Chad [name] a57 y.o.malewith a known history of anxiety, colon cancer status post hemicolectomy, depression, hyperlipidemia, recent stroke-lives in [place] and visiting his son over here.Admitted for weakness, confusion  * Dysphagia Chronic and some acute issues MBS with sign of aspiration with thin liquids Now on dysphagia 1 diet with nectar thickened liquids Needs ongoing speech following for swallow evaluations  * Septic shock secondary to UTI.    Cultures negative.    Cefepime - completed rx  * Right hydronephrosis. Status post right ureteral stent Pathology shows high-grade urothelial carcinoma--will need CT chest/abd/pelvis for staging when able to drink contrast Foley placed draining well Follow-up with Dr. Erlene Quan as outpatient  * Acute hypoxic respiratory failure secondary to septic shock. Extubated. Doing well. Now on room air  * Acute metabolic encephalopathy, Multifactorial.  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 Using walker at home per son  * Elevated LFTs Likely due to septic shock, continue to monitor. Patient with chronic hepatitis C. improving  * Microcytic anemia He needs outpatient transfusions frequently.  Hemoglobin stable at 9.4  * Acute kidney injury Secondary to right hydronephrosis and septic shock. Resolved  * Rhabdomyolysis resolved  *COPD and chronic smoking Currently no exacerbation symptoms, may continue bronchodilators.  * Severe deconditioning/ critical illness myopathy/ late CVA  deficits SNF at discharge   Needs palliative care following at skilled nursing facility  Stable for discharge  CONSULTS OBTAINED:  Treatment Team:  Hollice Espy, MD Catarina Hartshorn, MD  DRUG ALLERGIES:   Allergies  Allergen Reactions  .  Penicillins Rash    DISCHARGE MEDICATIONS:   Allergies as of 05/07/2018      Reactions   Penicillins Rash      Medication List    STOP taking these medications   furosemide 20 MG tablet Commonly known as:  LASIX   traMADol 50 MG tablet Commonly known as:  ULTRAM     TAKE these medications   atorvastatin 80 MG tablet Commonly known as:  LIPITOR Take 1 tablet (80 mg total) by mouth daily.   escitalopram 20 MG tablet Commonly known as:  LEXAPRO Take 1 tablet (20 mg total) by mouth daily.   gabapentin 100 MG capsule Commonly known as:  NEURONTIN Take one to 2 tabs for pain 3 x day   ibuprofen 200 MG tablet Commonly known as:  ADVIL,MOTRIN Take 200 mg by mouth every 6 (six) hours as needed.   pantoprazole sodium 40 mg/20 mL Pack Commonly known as:  PROTONIX Take 20 mLs (40 mg total) by mouth daily at 12 noon.   QUEtiapine 50 MG tablet Commonly known as:  SEROQUEL Take 1 tablet (50 mg total) by mouth at bedtime.       Today   VITAL SIGNS:  Blood pressure 124/65, pulse 64, temperature 97.7 F (36.5 C), temperature source Oral, resp. rate 19, height 6' (1.829 m), weight 69.9 kg, SpO2 99 %.  I/O:    Intake/Output Summary (Last 24 hours) at 05/07/2018 1217 Last data filed at 05/07/2018 6269 Gross per 24 hour  Intake 0 ml  Output 175 ml  Net -175 ml    PHYSICAL EXAMINATION:  Physical Exam  GENERAL:  57 y.o.-year-old patient lying in the bed with no acute distress.  LUNGS: Normal breath sounds bilaterally, no wheezing, rales,rhonchi or crepitation. No use of accessory muscles of respiration.  CARDIOVASCULAR: S1, S2 normal. No murmurs, rubs, or gallops.  ABDOMEN: Soft, non-tender, non-distended. Bowel sounds present. No organomegaly or mass.  NEUROLOGIC: Moves all 4 extremities. Left-sided weakness PSYCHIATRIC: The patient is alert and oriented. Dysarthria SKIN: No obvious rash, lesion, or ulcer.   DATA REVIEW:   CBC Recent Labs  Lab 05/01/18 0547   WBC 9.0  HGB 9.4*  HCT 30.8*  PLT 191    Chemistries  Recent Labs  Lab 05/01/18 0547  NA 147*  K 3.8  CL 114*  CO2 27  GLUCOSE 120*  BUN 44*  CREATININE 1.17  CALCIUM 7.5*    Cardiac Enzymes No results for input(s): TROPONINI in the last 168 hours.  Microbiology Results  Results for orders placed or performed during the hospital encounter of 04/25/18  Culture, blood (routine x 2)     Status: None   Collection Time: 04/25/18  9:43 AM  Result Value Ref Range Status   Specimen Description BLOOD RIGHT ARM  Final   Special Requests   Final    BOTTLES DRAWN AEROBIC AND ANAEROBIC Blood Culture results may not be optimal due to an inadequate volume of blood received in culture bottles   Culture   Final    NO GROWTH 5 DAYS Performed at [facility], [address] [place], [place] [address]    Report Status 04/30/2018 FINAL  Final  Culture, blood (routine x 2)  Status: None   Collection Time: 04/25/18  9:43 AM  Result Value Ref Range Status   Specimen Description BLOOD LEFT ARM  Final   Special Requests   Final    BOTTLES DRAWN AEROBIC AND ANAEROBIC Blood Culture results may not be optimal due to an inadequate volume of blood received in culture bottles   Culture   Final    NO GROWTH 5 DAYS Performed at [facility], [address] [place], [place] [address]    Report Status 04/30/2018 FINAL  Final  Urine culture     Status: None   Collection Time: 04/25/18  9:43 AM  Result Value Ref Range Status   Specimen Description   Final    URINE, CATHETERIZED Performed at [facility], [address] [place], [place] [address]    Special Requests   Final    NONE Performed at [facility], [address], [place], [place] [address]    Culture   Final    NO GROWTH Performed at [facility], [place] [address], [place], [place] [address]    Report Status 04/26/2018 FINAL  Final  MRSA PCR Screening     Status: None    Collection Time: 04/25/18 11:58 AM  Result Value Ref Range Status   MRSA by PCR NEGATIVE NEGATIVE Final    Comment:        The GeneXpert MRSA Assay (FDA approved for NASAL specimens only), is one component of a comprehensive MRSA colonization surveillance program. It is not intended to diagnose MRSA infection nor to guide or monitor treatment for MRSA infections. Performed at [facility], [address], [place], [place] [address]   Urine Culture     Status: None   Collection Time: 04/25/18  6:13 PM  Result Value Ref Range Status   Specimen Description   Final    URINE, RANDOM Performed at [facility], [address], [place], [place] [address]    Special Requests   Final    NONE Performed at [facility], [address] [place], [place] [address]    Culture   Final    NO GROWTH Performed at [facility], [place] [address], [place], [place] [address]    Report Status 04/27/2018 FINAL  Final    RADIOLOGY:  No results found.  Follow up with PCP in 1 week.  Management plans discussed with the patient, family and they are in agreement.  CODE STATUS:     Code Status Orders  (From admission, onward)         Start     Ordered   04/25/18 1153  Full code  Continuous     04/25/18 1152        Code Status History    This patient has a current code status but no historical code status.      TOTAL TIME TAKING CARE OF THIS PATIENT ON DAY OF DISCHARGE: more than 30 minutes.   Leia Alf Chad [name] M.D on 05/07/2018 at 12:17 PM  Between 7am to 6pm - Pager - [phone]  After 6pm go to [email or web address] - password [email or web address] [facility]  Office  [phone]  CC: Primary care physician; [name], MD  Note: This dictation was prepared with Dragon dictation along with smaller phrase technology. Any transcriptional errors that result from this process are unintentional.

## 2018-05-10 NOTE — Telephone Encounter (Signed)
V & T has been canceled and follow scheduled   Sharyn Lull

## 2018-05-13 ENCOUNTER — Encounter: Payer: Self-pay | Admitting: Urology

## 2018-05-13 ENCOUNTER — Ambulatory Visit (INDEPENDENT_AMBULATORY_CARE_PROVIDER_SITE_OTHER): Payer: Medicaid Other | Admitting: Urology

## 2018-05-13 VITALS — BP 129/82 | HR 68 | Ht 67.0 in | Wt 167.0 lb

## 2018-05-13 DIAGNOSIS — Z85038 Personal history of other malignant neoplasm of large intestine: Secondary | ICD-10-CM

## 2018-05-13 DIAGNOSIS — C689 Malignant neoplasm of urinary organ, unspecified: Secondary | ICD-10-CM | POA: Diagnosis not present

## 2018-05-13 LAB — BLADDER SCAN AMB NON-IMAGING

## 2018-05-13 NOTE — Progress Notes (Signed)
05/13/2018 7:38 PM   Chad [name] [birth year] [id number]  Referring provider: [name], [address] [place], [place] [address]  Chief Complaint  Patient presents with  . Hospitalization Follow-up    HPI: 57 year old male with multiple medical comorbidities with newly diagnosed high-grade right upper tract urothelial carcinoma who presents today to the office as an outpatient to discuss management of his malignancy.  He initially presented found down, hypothermic with multiorgan failure, sepsis and right ureteral obstruction.  He was taken to the operating room for stent placement at which time urine cytology was sent from the upper tract which was consistent with high-grade urothelial carcinoma.  A stent was placed.  He ultimately improved clinically and was discharged with stent in place.  He does have a personal history of colon cancer status post open hemicolectomy in February 2019 at [facility] [place]. He reports that they did an extensive lymph node dissection and at least 1 of the lymph nodes were positive.  We do not have this records or pathology.  He reports that he was recommended to have adjuvant treatment including chemotherapy but moved to to live with his son as he can no longer take care of himself.  He has not yet established care with oncology in [facility] for this.  He did have CT chest abdomen pelvis on 05/07/2018 just prior to discharge which shows patchy areas of tree-in-bud nodularity of the left lung to be mostly infectious/inflammatory etiology.  He also has a mild subcarinal lymphadenopathy also found to be possibly reactive with bilateral bibasilar collapses and pleural effusions.  Soft tissue thickening in the right renal pelvis was appreciated without obvious adenopathy or obvious metastatic disease.  He is currently in rehab.  He does have multiple medical comorbidities including colon cancer, hep C, history of significant substance abuse,  history of stroke with hemiparesis, dysphasia, amongst others.  His functional status is somewhat limited at this point in time, in a wheelchair today.  He is unaccompanied.  He is significantly more cognitively intact today than on previous occasions.  Have an extensive smoking history.   PMH: Past Medical History:  Diagnosis Date  . Anxiety   . Cancer [facility])    prostate  . Depression   . Hyperlipidemia   . Nerve pain   . Stroke [facility])     Surgical History: Past Surgical History:  Procedure Laterality Date  . COLON SURGERY     En bloc extended right hemicolectomy 07/2017  . CYSTOSCOPY WITH STENT PLACEMENT Right 04/25/2018   Procedure: CYSTOSCOPY WITH STENT PLACEMENT;  Surgeon: Hollice Espy, MD;  Location: [facility] ORS;  Service: Urology;  Laterality: Right;  . tumor removed       Home Medications:  Allergies as of 05/13/2018      Reactions   Penicillins Rash      Medication List        Accurate as of 05/13/18  7:38 PM. Always use your most recent med list.          atorvastatin 80 MG tablet Commonly known as:  LIPITOR Take 1 tablet (80 mg total) by mouth daily.   escitalopram 20 MG tablet Commonly known as:  LEXAPRO Take 1 tablet (20 mg total) by mouth daily.   gabapentin 100 MG capsule Commonly known as:  NEURONTIN Take one to 2 tabs for pain 3 x day   ibuprofen 200 MG tablet Commonly known as:  ADVIL,MOTRIN Take 200 mg by mouth every 6 (six)  hours as needed.   pantoprazole sodium 40 mg/20 mL Pack Commonly known as:  PROTONIX Take 20 mLs (40 mg total) by mouth daily at 12 noon.   QUEtiapine 50 MG tablet Commonly known as:  SEROQUEL Take 1 tablet (50 mg total) by mouth at bedtime.       Allergies:  Allergies  Allergen Reactions  . Penicillins Rash    Family History: Family History  Problem Relation Age of Onset  . Prostate cancer Father     Social History:  reports that he has been smoking cigarettes. He has been smoking about 1.00 pack per  day. He has never used smokeless tobacco. He reports that he does not drink alcohol or use drugs.  ROS: UROLOGY Frequent Urination?: No Hard to postpone urination?: No Burning/pain with urination?: No Get up at night to urinate?: No Leakage of urine?: Yes Urine stream starts and stops?: No Trouble starting stream?: No Do you have to strain to urinate?: No Blood in urine?: Yes Urinary tract infection?: No Sexually transmitted disease?: No Injury to kidneys or bladder?: No Painful intercourse?: No Weak stream?: No Erection problems?: No Penile pain?: No  Gastrointestinal Nausea?: No Vomiting?: No Indigestion/heartburn?: No Diarrhea?: No Constipation?: No  Constitutional Fever: No Night sweats?: No Weight loss?: No Fatigue?: No  Skin Skin rash/lesions?: No Itching?: No  Eyes Blurred vision?: No Double vision?: No  Ears/Nose/Throat Sore throat?: No Sinus problems?: No  Hematologic/Lymphatic Swollen glands?: No Easy bruising?: No  Cardiovascular Leg swelling?: No Chest pain?: No  Respiratory Cough?: No Shortness of breath?: No  Endocrine Excessive thirst?: No  Musculoskeletal Back pain?: No Joint pain?: No  Neurological Headaches?: No Dizziness?: No  Psychologic Depression?: No Anxiety?: No  Physical Exam: BP 129/82 (BP Location: Left Arm, Patient Position: Sitting, Cuff Size: Normal)   Pulse 68   Ht 5\' 7"  (1.702 m)   Wt 167 lb (75.8 kg)   BMI 26.16 kg/m   Constitutional:  Alert and oriented, No acute distress.  No tremor. HEENT: [place] AT, moist mucus membranes.  Trachea midline, no masses. Cardiovascular: No clubbing, cyanosis, or edema. Respiratory: Normal respiratory effort, no increased work of breathing. GI: Abdomen is soft, nontender, nondistended, no abdominal masses GU: No CVA tenderness. Skin: No rashes, bruises or suspicious lesions. Neurologic: Unilateral weakness appreciated.  Dysarthria also appreciable. Ext: Necrosis on  hands/fingers appreciated. Psychiatric: Normal mood and affect.  Laboratory Data: Lab Results  Component Value Date   WBC 9.0 05/01/2018   HGB 9.4 (L) 05/01/2018   HCT 30.8 (L) 05/01/2018   MCV 65.4 (L) 05/01/2018   PLT 191 05/01/2018    Lab Results  Component Value Date   CREATININE 1.17 05/01/2018   Urinalysis N/a  Pertinent Imaging: CLINICAL DATA:  Weakness and confusion.  History of colon cancer.  EXAM: CT CHEST, ABDOMEN, AND PELVIS WITH CONTRAST  TECHNIQUE: Multidetector CT imaging of the chest, abdomen and pelvis was performed following the standard protocol during bolus administration of intravenous contrast.  CONTRAST:  157mL ISOVUE-300 IOPAMIDOL (ISOVUE-300) INJECTION 61%  COMPARISON:  Abdomen/pelvis CT 04/25/2018  FINDINGS: CT CHEST FINDINGS  Cardiovascular: Heart size upper normal. No substantial pericardial effusion. Coronary artery calcification is evident. Atherosclerotic calcification is noted in the wall of the thoracic aorta.  Mediastinum/Nodes: 12 mm short axis subcarinal lymph node is associated with scattered nonenlarged mediastinal lymph nodes. There is no hilar lymphadenopathy. The esophagus has normal imaging features. Small lymph nodes are seen in the hilar regions bilaterally.  Lungs/Pleura: The central tracheobronchial  airways are patent. Secretions are noted in the right mainstem bronchus. Right upper lobe calcified granulomas are noted. Dependent collapse/consolidation noted right lower lobe with small right pleural effusion.  Patchy areas of clustered nodularity are seen in the left upper lobe (53/4). An area of this confluent multinodular opacity in the left upper lobe measures 1.2 x 1.2 cm (58/4). Posterior left lower lobe confluent collapse/consolidation is associated with small left pleural effusion.  Musculoskeletal: No worrisome lytic or sclerotic osseous abnormality.  CT ABDOMEN PELVIS  FINDINGS  Hepatobiliary: No focal abnormality within the liver parenchyma. Gallbladder unremarkable. No intrahepatic or extrahepatic biliary dilation.  Pancreas: No focal mass lesion. No dilatation of the main duct. No intraparenchymal cyst. No peripancreatic edema.  Spleen: No splenomegaly. No focal mass lesion.  Adrenals/Urinary Tract: No adrenal nodule or mass. Tiny stones are seen in the right kidney with mild, asymmetrically decreased perfusion of the right kidney. Right double-J internal ureteral stent is new in the interval and right-sided hydronephrosis has decreased since prior study. There is persistent high attenuation material in the right renal pelvis consistent with known urothelial neoplasm. Similar appearance of the left lower pole renal cyst. Left ureter unremarkable. The urinary bladder appears normal for the degree of distention.  Stomach/Bowel: Stomach is nondistended. No gastric wall thickening. No evidence of outlet obstruction. Duodenum is normally positioned as is the ligament of Treitz. No small bowel wall thickening. No small bowel dilatation. Status post right hemicolectomy.  Vascular/Lymphatic: There is abdominal aortic atherosclerosis without aneurysm. Small lymph nodes in the hepato duodenal ligament and retroperitoneal space are stable. No pelvic sidewall lymphadenopathy. Upper normal groin lymph nodes are evident bilaterally.  Reproductive: Prostate gland unremarkable.  Other: Small volume intraperitoneal free fluid is associated with diffuse mesenteric and body wall edema.  Musculoskeletal: No worrisome lytic or sclerotic osseous abnormality.  IMPRESSION: 1. Patchy areas of tree-in-bud nodularity in the left lung with some areas of associated more confluent nodularity. Appearance is most likely related to infectious/inflammatory etiology and atypical infection would be a consideration. Given the history of colon cancer, follow-up  recommended to ensure resolution. 2. Mild subcarinal lymphadenopathy, potentially reactive. This can also be further assessed at the time of follow-up. 3. Bibasilar collapse/consolidation with small bilateral pleural effusions. 4. Persistent soft tissue fullness in the right renal pelvis consistent with known urothelial neoplasm. Right internal ureteral stent is new in the interval. 5. Small volume intraperitoneal free fluid with mesenteric and body wall edema associated. 6.  Aortic Atherosclerois (ICD10-170.0)   Electronically Signed   By: Misty Stanley M.D.   On: 05/07/2018 16:15   Assessment & Plan:    1. Urothelial cancer ([place]) Newly dx right upper tract urothelial carcinoma, high grade and obstructive without obvious metastatic diease on CT scan We discussed that the standard of care would be consideration of neoadjuvant chemotherapy followed by nephro ureterectomy Unfortunately, the patient has a relatively poor functional status at this point in time and recent severe life-threatening illness thus is a relatively poor surgical candidate at this time In addition, he has a history of colon cancer with metastatic disease to the lymph node presumably which may significantly impact his overall life expectancy I recommended a CT PET to evaluate for possible metabolically active disease including both urothelial as well as colon cancer to have a better assessment of his overall prognosis and whether or not surgery positively impact both the quality and longevity of his life He is agreeable this plan In addition, record records from [facility]  of [place] have been requested today with his pathology and staging Exline I recommended referral to medical oncology to discuss the above and will likely present the patient at tumor board in the near future to discuss his care plan Patient is agreeable this plan - Bladder Scan (Post Void Residual) in office - Ambulatory referral to  Oncology - NM PET Image Initial (PI) Skull Base To Thigh; Future  2. History of colon cancer Above   Return for medical records [facility] release, f/u me in 2 weeks after PET.  Hollice Espy, MD  [facility] [address], [place], [place] [address] [phone]   I spent 25 min with this patient of which greater than 50% was spent in counseling and coordination of care with the patient.

## 2018-05-14 ENCOUNTER — Ambulatory Visit: Payer: Medicaid Other

## 2018-05-19 ENCOUNTER — Inpatient Hospital Stay: Payer: Medicaid Other | Admitting: Internal Medicine

## 2018-05-26 ENCOUNTER — Inpatient Hospital Stay: Payer: Medicaid Other | Attending: Internal Medicine | Admitting: Internal Medicine

## 2018-05-26 ENCOUNTER — Inpatient Hospital Stay: Payer: Medicaid Other

## 2018-05-26 ENCOUNTER — Encounter: Payer: Self-pay | Admitting: Internal Medicine

## 2018-05-26 ENCOUNTER — Encounter (INDEPENDENT_AMBULATORY_CARE_PROVIDER_SITE_OTHER): Payer: Self-pay

## 2018-05-26 VITALS — BP 115/72 | HR 84 | Temp 97.1°F | Resp 16

## 2018-05-26 DIAGNOSIS — R319 Hematuria, unspecified: Secondary | ICD-10-CM

## 2018-05-26 DIAGNOSIS — C689 Malignant neoplasm of urinary organ, unspecified: Secondary | ICD-10-CM

## 2018-05-26 DIAGNOSIS — R5383 Other fatigue: Secondary | ICD-10-CM | POA: Insufficient documentation

## 2018-05-26 DIAGNOSIS — C661 Malignant neoplasm of right ureter: Secondary | ICD-10-CM

## 2018-05-26 DIAGNOSIS — F418 Other specified anxiety disorders: Secondary | ICD-10-CM | POA: Diagnosis not present

## 2018-05-26 DIAGNOSIS — E785 Hyperlipidemia, unspecified: Secondary | ICD-10-CM | POA: Insufficient documentation

## 2018-05-26 DIAGNOSIS — F17210 Nicotine dependence, cigarettes, uncomplicated: Secondary | ICD-10-CM | POA: Diagnosis not present

## 2018-05-26 DIAGNOSIS — R5381 Other malaise: Secondary | ICD-10-CM

## 2018-05-26 DIAGNOSIS — Z79899 Other long term (current) drug therapy: Secondary | ICD-10-CM | POA: Diagnosis not present

## 2018-05-26 DIAGNOSIS — B1920 Unspecified viral hepatitis C without hepatic coma: Secondary | ICD-10-CM | POA: Insufficient documentation

## 2018-05-26 DIAGNOSIS — Z9049 Acquired absence of other specified parts of digestive tract: Secondary | ICD-10-CM | POA: Insufficient documentation

## 2018-05-26 DIAGNOSIS — I69351 Hemiplegia and hemiparesis following cerebral infarction affecting right dominant side: Secondary | ICD-10-CM | POA: Diagnosis not present

## 2018-05-26 DIAGNOSIS — Z880 Allergy status to penicillin: Secondary | ICD-10-CM | POA: Insufficient documentation

## 2018-05-26 LAB — CBC WITH DIFFERENTIAL/PLATELET
ABS IMMATURE GRANULOCYTES: 0.02 10*3/uL (ref 0.00–0.07)
BASOS PCT: 1 %
Basophils Absolute: 0 10*3/uL (ref 0.0–0.1)
EOS ABS: 0.4 10*3/uL (ref 0.0–0.5)
EOS PCT: 8 %
HEMATOCRIT: 29.8 % — AB (ref 39.0–52.0)
Hemoglobin: 9.1 g/dL — ABNORMAL LOW (ref 13.0–17.0)
Immature Granulocytes: 0 %
Lymphocytes Relative: 22 %
Lymphs Abs: 1.1 10*3/uL (ref 0.7–4.0)
MCH: 21.6 pg — ABNORMAL LOW (ref 26.0–34.0)
MCHC: 30.5 g/dL (ref 30.0–36.0)
MCV: 70.6 fL — AB (ref 80.0–100.0)
MONO ABS: 0.4 10*3/uL (ref 0.1–1.0)
Monocytes Relative: 8 %
Neutro Abs: 3 10*3/uL (ref 1.7–7.7)
Neutrophils Relative %: 61 %
Platelets: 182 10*3/uL (ref 150–400)
RBC: 4.22 MIL/uL (ref 4.22–5.81)
Smear Review: NORMAL
WBC: 4.9 10*3/uL (ref 4.0–10.5)
nRBC: 0 % (ref 0.0–0.2)

## 2018-05-26 LAB — COMPREHENSIVE METABOLIC PANEL
ALT: 16 U/L (ref 0–44)
AST: 31 U/L (ref 15–41)
Albumin: 3.1 g/dL — ABNORMAL LOW (ref 3.5–5.0)
Alkaline Phosphatase: 113 U/L (ref 38–126)
Anion gap: 7 (ref 5–15)
BUN: 13 mg/dL (ref 6–20)
CALCIUM: 8.6 mg/dL — AB (ref 8.9–10.3)
CHLORIDE: 109 mmol/L (ref 98–111)
CO2: 22 mmol/L (ref 22–32)
CREATININE: 0.95 mg/dL (ref 0.61–1.24)
Glucose, Bld: 125 mg/dL — ABNORMAL HIGH (ref 70–99)
Potassium: 3.8 mmol/L (ref 3.5–5.1)
Sodium: 138 mmol/L (ref 135–145)
TOTAL PROTEIN: 7.6 g/dL (ref 6.5–8.1)
Total Bilirubin: 0.4 mg/dL (ref 0.3–1.2)

## 2018-05-26 LAB — IRON AND TIBC
Iron: 18 ug/dL — ABNORMAL LOW (ref 45–182)
Saturation Ratios: 4 % — ABNORMAL LOW (ref 17.9–39.5)
TIBC: 432 ug/dL (ref 250–450)
UIBC: 414 ug/dL

## 2018-05-26 LAB — LACTATE DEHYDROGENASE: LDH: 148 U/L (ref 98–192)

## 2018-05-26 LAB — FERRITIN: FERRITIN: 17 ng/mL — AB (ref 24–336)

## 2018-05-26 NOTE — Assessment & Plan Note (Addendum)
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  Await PET scan as ordered for next week.  No obvious metastases noted on the CT of the chest abdomen pelvis otherwise.  # ? colon cancer status post hemicolectomy-question 1 possible lymph node as per patient.  We will try to get the records from [place].  # Hepatitis C-check hepatitis RNA  #Active smoker-patient not interested in smoking cessation.  # Stroke affecting L side-stable.  #Patient's medical history/social history is quite complicated-after the PET scan is available; will discuss with urology the next plan of care.  #Disposition: Labs today CBC CMP/hepatitis RNA; iron studies LDH ferritin.  #Follow-up in 10 days.   # Thank you Dr.Brandon for allowing me to participate in the care of your pleasant patient. Please do not hesitate to contact me with questions or concerns in the interim.

## 2018-05-26 NOTE — Progress Notes (Signed)
Patient is here for a new patient visit.

## 2018-05-26 NOTE — Progress Notes (Signed)
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 Colectomy ?  # Hep C/ # stroke of left side/weakness-2018 Nov [[place]]; active smoker  DIAGNOSIS: [ ]   STAGE:       ;GOALS:  CURRENT/MOST RECENT THERAPY [ ]       Urothelial cancer ([place])    Initial Diagnosis    Urothelial cancer ([place])     Ureteral cancer, right ([place])   05/26/2018 Initial Diagnosis    Ureteral cancer, right ([place])      HISTORY OF PRESENTING ILLNESS: Patient is a poor historian.  Is alone. Chad [name] 57 y.o.  male multiple medical problems including active smoking; stroke with left-sided hemi-paresis; question colon cancer needing surgery approximately 6 months ago [in [place]]-has been referred to [place] for further evaluation and recommendations for high-grade urothelial cancer.  Patient was recently admitted to hospital end of September with multiorgan failure/hypothermia-imaging noted right-sided hydronephrosis-for which he had a stent placement.  Urine cytology showed high-grade urethral cancer.  Patient continues to have intermittent blood in urine.  No Foley catheter.   Review of Systems  Constitutional: Positive for malaise/fatigue. Negative for chills, diaphoresis, fever and weight loss.  HENT: Negative for nosebleeds and sore throat.   Eyes: Negative for double vision.  Respiratory: Negative for cough, hemoptysis, sputum production, shortness of breath and wheezing.   Cardiovascular: Negative for chest pain, palpitations, orthopnea and leg swelling.  Gastrointestinal: Negative for abdominal pain, blood in stool, constipation, diarrhea, heartburn,  melena, nausea and vomiting.  Genitourinary: Positive for hematuria. Negative for dysuria, frequency and urgency.  Musculoskeletal: Positive for back pain and joint pain.  Skin: Negative.  Negative for itching and rash.  Neurological: Positive for focal weakness. Negative for dizziness, tingling, weakness and headaches.       Chronic left-sided weakness upper than lower extremity.  Endo/Heme/Allergies: Does not bruise/bleed easily.  Psychiatric/Behavioral: Negative for depression. The patient is not nervous/anxious and does not have insomnia.      MEDICAL HISTORY:  Past Medical History:  Diagnosis Date  . Anxiety   . Cancer [facility])    prostate  . Depression   . Hyperlipidemia   . Nerve pain   . Stroke [facility])     SURGICAL HISTORY: Past Surgical History:  Procedure Laterality Date  . COLON SURGERY     En bloc extended right hemicolectomy 07/2017  . CYSTOSCOPY WITH STENT PLACEMENT Right 04/25/2018   Procedure: CYSTOSCOPY WITH STENT PLACEMENT;  Surgeon: Hollice Espy, MD;  Location: [facility] ORS;  Service: Urology;  Laterality: Right;  . tumor removed       SOCIAL HISTORY: Social History   Socioeconomic History  . Marital status: Single    Spouse name: Not on file  . Number of children: Not on file  . Years of education: Not on file  . Highest education level: Not on file  Occupational History  . Not on file  Social Needs  . Financial resource strain: Not on file  . Food insecurity:    Worry: Not on file    Inability: Not on file  . Transportation needs:    Medical: Not on file    Non-medical: Not  on file  Tobacco Use  . Smoking status: Current Every Day Smoker    Packs/day: 1.00    Types: Cigarettes  . Smokeless tobacco: Never Used  Substance and Sexual Activity  . Alcohol use: Never    Frequency: Never  . Drug use: Never  . Sexual activity: Not on file  Lifestyle  . Physical activity:    Days per week: Not on file    Minutes per session: Not on file  . Stress:  Not on file  Relationships  . Social connections:    Talks on phone: Not on file    Gets together: Not on file    Attends religious service: Not on file    Active member of club or organization: Not on file    Attends meetings of clubs or organizations: Not on file    Relationship status: Not on file  . Intimate partner violence:    Fear of current or ex partner: Not on file    Emotionally abused: Not on file    Physically abused: Not on file    Forced sexual activity: Not on file  Other Topics Concern  . Not on file  Social History Narrative    used to live [place]; moved  To [place]- end of April 2019; in Nursing home; 1pp/day; quit alcohol. 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     FAMILY HISTORY: Family History  Problem Relation Age of Onset  . Prostate cancer Father     ALLERGIES:  is allergic to penicillins.  MEDICATIONS:  Current Outpatient Medications  Medication Sig Dispense Refill  . atorvastatin (LIPITOR) 80 MG tablet Take 1 tablet (80 mg total) by mouth daily. 90 tablet 3  . escitalopram (LEXAPRO) 20 MG tablet Take 1 tablet (20 mg total) by mouth daily. 90 tablet 3  . gabapentin (NEURONTIN) 100 MG capsule Take one to 2 tabs for pain 3 x day 180 capsule 3  . ibuprofen (ADVIL,MOTRIN) 200 MG tablet Take 200 mg by mouth every 6 (six) hours as needed.    . pantoprazole sodium (PROTONIX) 40 mg/20 mL PACK Take 20 mLs (40 mg total) by mouth daily at 12 noon. 30 each   . QUEtiapine (SEROQUEL) 50 MG tablet Take 1 tablet (50 mg total) by mouth at bedtime. 90 tablet 3   No current facility-administered medications for this visit.       Marland Kitchen  PHYSICAL EXAMINATION: ECOG PERFORMANCE STATUS: 1 - Symptomatic but completely ambulatory  Vitals:   05/26/18 1440 05/26/18 1441  BP:  115/72  Pulse:  84  Resp: 16 16  Temp:  (!) 97.1 F (36.2 C)   There were no vitals filed for this visit.  Physical Exam  Constitutional: He is oriented to person, place, and time.  In  wheelchair. Cachectic.  Appears older than his stated age.  Alone.  HENT:  Head: Normocephalic and atraumatic.  Mouth/Throat: Oropharynx is clear and moist. No oropharyngeal exudate.  Eyes: Pupils are equal, round, and reactive to light.  Neck: Normal range of motion. Neck supple.  Cardiovascular: Normal rate and regular rhythm.  Pulmonary/Chest: No respiratory distress. He has no wheezes.  Abdominal: Soft. Bowel sounds are normal. He exhibits no distension and no mass. There is no tenderness. There is no rebound and no guarding.  Musculoskeletal: Normal range of motion. He exhibits no edema or tenderness.  Neurological: He is alert and oriented to person, place, and time.  Weakness left upper and lower extremity.  Skin: Skin is  warm.  Psychiatric: Affect normal.     LABORATORY DATA:  I have reviewed the data as listed Lab Results  Component Value Date   WBC 9.0 05/01/2018   HGB 9.4 (L) 05/01/2018   HCT 30.8 (L) 05/01/2018   MCV 65.4 (L) 05/01/2018   PLT 191 05/01/2018   Recent Labs    04/25/18 0751  04/26/18 0540  04/29/18 0337 04/30/18 0457 05/01/18 0547  NA 136   < > 136   < > 151* 148* 147*  K 4.7   < > 3.2*   < > 3.9 3.8 3.8  CL 102   < > 108   < > 114* 114* 114*  CO2 22   < > 20*   < > 29 28 27   GLUCOSE 209*   < > 148*   < > 112* 111* 120*  BUN 88*   < > 92*   < > 52* 48* 44*  CREATININE 3.42*   < > 3.04*   < > 1.38* 1.16 1.17  CALCIUM 7.6*   < > 6.7*   < > 7.7* 7.6* 7.5*  GFRNONAA 18*   < > 21*   < > 54* >60 >60  GFRAA 21*   < > 24*   < > >60 >60 >60  PROT 7.5  --  5.5*  --   --   --   --   ALBUMIN 2.9*  --  2.3*  --   --   --   --   AST 1,155*  --  509*  --   --   --   --   ALT 283*  --  157*  --   --   --   --   ALKPHOS 96  --  69  --   --   --   --   BILITOT 0.9  --  0.8  --   --   --   --    < > = values in this interval not displayed.    RADIOGRAPHIC STUDIES: I have personally reviewed the radiological images as listed and agreed with the findings in  the report. Ct Chest W Contrast  Result Date: 05/07/2018 CLINICAL DATA:  Weakness and confusion.  History of colon cancer. EXAM: CT CHEST, ABDOMEN, AND PELVIS WITH CONTRAST TECHNIQUE: Multidetector CT imaging of the chest, abdomen and pelvis was performed following the standard protocol during bolus administration of intravenous contrast. CONTRAST:  1107mL ISOVUE-300 IOPAMIDOL (ISOVUE-300) INJECTION 61% COMPARISON:  Abdomen/pelvis CT 04/25/2018 FINDINGS: CT CHEST FINDINGS Cardiovascular: Heart size upper normal. No substantial pericardial effusion. Coronary artery calcification is evident. Atherosclerotic calcification is noted in the wall of the thoracic aorta. Mediastinum/Nodes: 12 mm short axis subcarinal lymph node is associated with scattered nonenlarged mediastinal lymph nodes. There is no hilar lymphadenopathy. The esophagus has normal imaging features. Small lymph nodes are seen in the hilar regions bilaterally. Lungs/Pleura: The central tracheobronchial airways are patent. Secretions are noted in the right mainstem bronchus. Right upper lobe calcified granulomas are noted. Dependent collapse/consolidation noted right lower lobe with small right pleural effusion. Patchy areas of clustered nodularity are seen in the left upper lobe (53/4). An area of this confluent multinodular opacity in the left upper lobe measures 1.2 x 1.2 cm (58/4). Posterior left lower lobe confluent collapse/consolidation is associated with small left pleural effusion. Musculoskeletal: No worrisome lytic or sclerotic osseous abnormality. CT ABDOMEN PELVIS FINDINGS Hepatobiliary: No focal abnormality within the liver parenchyma. Gallbladder unremarkable. No intrahepatic or  extrahepatic biliary dilation. Pancreas: No focal mass lesion. No dilatation of the main duct. No intraparenchymal cyst. No peripancreatic edema. Spleen: No splenomegaly. No focal mass lesion. Adrenals/Urinary Tract: No adrenal nodule or mass. Tiny stones are seen  in the right kidney with mild, asymmetrically decreased perfusion of the right kidney. Right double-J internal ureteral stent is new in the interval and right-sided hydronephrosis has decreased since prior study. There is persistent high attenuation material in the right renal pelvis consistent with known urothelial neoplasm. Similar appearance of the left lower pole renal cyst. Left ureter unremarkable. The urinary bladder appears normal for the degree of distention. Stomach/Bowel: Stomach is nondistended. No gastric wall thickening. No evidence of outlet obstruction. Duodenum is normally positioned as is the ligament of Treitz. No small bowel wall thickening. No small bowel dilatation. Status post right hemicolectomy. Vascular/Lymphatic: There is abdominal aortic atherosclerosis without aneurysm. Small lymph nodes in the hepato duodenal ligament and retroperitoneal space are stable. No pelvic sidewall lymphadenopathy. Upper normal groin lymph nodes are evident bilaterally. Reproductive: Prostate gland unremarkable. Other: Small volume intraperitoneal free fluid is associated with diffuse mesenteric and body wall edema. Musculoskeletal: No worrisome lytic or sclerotic osseous abnormality. IMPRESSION: 1. Patchy areas of tree-in-bud nodularity in the left lung with some areas of associated more confluent nodularity. Appearance is most likely related to infectious/inflammatory etiology and atypical infection would be a consideration. Given the history of colon cancer, follow-up recommended to ensure resolution. 2. Mild subcarinal lymphadenopathy, potentially reactive. This can also be further assessed at the time of follow-up. 3. Bibasilar collapse/consolidation with small bilateral pleural effusions. 4. Persistent soft tissue fullness in the right renal pelvis consistent with known urothelial neoplasm. Right internal ureteral stent is new in the interval. 5. Small volume intraperitoneal free fluid with mesenteric and  body wall edema associated. 6.  Aortic Atherosclerois (ICD10-170.0) Electronically Signed   By: Misty Stanley M.D.   On: 05/07/2018 16:15   Mr Brain Wo Contrast  Addendum Date: 04/29/2018   ADDENDUM REPORT: 04/29/2018 16:13 ADDENDUM: Study discussed by telephone with Dr. Merton Border in the ICU on 04/29/2018 at 1607 hours. Electronically Signed   By: Genevie Ann M.D.   On: 04/29/2018 16:13   Result Date: 04/29/2018 CLINICAL DATA:  57 year old male found down outside. Suspected sepsis. Ataxia. EXAM: MRI HEAD WITHOUT CONTRAST TECHNIQUE: Multiplanar, multiecho pulse sequences of the brain and surrounding structures were obtained without intravenous contrast. COMPARISON:  Head CT without contrast 04/25/2018. FINDINGS: Brain: Chronic encephalomalacia in the left inferior frontal gyrus and right operculum. Superimposed superficial siderosis along the right hemisphere, most pronounced in the parietal lobe (series 11, image 44) and at the operculum (image 36). Scattered small micro hemorrhages in both hemispheres, including concentrated at the splenium of the corpus callosum (image 33). Evidence of some Wallerian degeneration in the brainstem including at the midbrain (series 10, image 10) and at the right medullary pyramid (image 4). Additionally there are small bilateral subdural hematomas, slightly larger in the left hemisphere measuring up to 4 millimeters in thickness. These were not apparent on the recent CT. No midline shift. No significant intracranial mass effect. No restricted diffusion to suggest acute infarction. Scattered bilateral nonspecific cerebral white matter T2 and FLAIR hyperintensity superimposed on the encephalomalacia described earlier. No chronic blood products in the posterior fossa. No evidence of mass lesion, ventriculomegaly. Cervicomedullary junction and pituitary are within normal limits. Vascular: Major intracranial vascular flow voids are preserved. Skull and upper cervical spine: Negative  visible cervical spine. Nonspecific  mildly decreased T1 bone marrow signal diffusely. No destructive osseous lesion identified. Sinuses/Orbits: Normal orbits soft tissues. Chronic paranasal sinus periosteal thickening plus mild mucosal thickening. The fluid level in the right sphenoid sinus has regressed. Other: Mild left mastoid effusion. Trace right mastoid fluid. Negative nasopharynx. Scalp and face soft tissues appear negative. IMPRESSION: 1. No acute infarct, but there are small bilateral Subdural Hematomas. That on the left is slightly larger measuring up to 4 millimeters in thickness. No associated intracranial mass effect. 2. Chronic encephalomalacia in the bilateral frontal lobes could be ischemic and/or posttraumatic. There are scattered chronic micro-hemorrhages in both hemispheres, including in the splenium of the corpus callosum which could be seen as the sequelae of trauma. There is mild superficial siderosis in the right hemisphere. Some Wallerian degeneration is evident in the right brainstem. 3. 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 Mild likely postinflammatory mastoid fluid. Electronically Signed: By: Genevie Ann M.D. On: 04/29/2018 15:19   Ct Abdomen Pelvis W Contrast  Result Date: 05/07/2018 CLINICAL DATA:  Weakness and confusion.  History of colon cancer. EXAM: CT CHEST, ABDOMEN, AND PELVIS WITH CONTRAST TECHNIQUE: Multidetector CT imaging of the chest, abdomen and pelvis was performed following the standard protocol during bolus administration of intravenous contrast. CONTRAST:  123mL ISOVUE-300 IOPAMIDOL (ISOVUE-300) INJECTION 61% COMPARISON:  Abdomen/pelvis CT 04/25/2018 FINDINGS: CT CHEST FINDINGS Cardiovascular: Heart size upper normal. No substantial pericardial effusion. Coronary artery calcification is evident. Atherosclerotic calcification is noted in the wall of the thoracic aorta. Mediastinum/Nodes: 12 mm short axis subcarinal lymph node is associated with scattered nonenlarged  mediastinal lymph nodes. There is no hilar lymphadenopathy. The esophagus has normal imaging features. Small lymph nodes are seen in the hilar regions bilaterally. Lungs/Pleura: The central tracheobronchial airways are patent. Secretions are noted in the right mainstem bronchus. Right upper lobe calcified granulomas are noted. Dependent collapse/consolidation noted right lower lobe with small right pleural effusion. Patchy areas of clustered nodularity are seen in the left upper lobe (53/4). An area of this confluent multinodular opacity in the left upper lobe measures 1.2 x 1.2 cm (58/4). Posterior left lower lobe confluent collapse/consolidation is associated with small left pleural effusion. Musculoskeletal: No worrisome lytic or sclerotic osseous abnormality. CT ABDOMEN PELVIS FINDINGS Hepatobiliary: No focal abnormality within the liver parenchyma. Gallbladder unremarkable. No intrahepatic or extrahepatic biliary dilation. Pancreas: No focal mass lesion. No dilatation of the main duct. No intraparenchymal cyst. No peripancreatic edema. Spleen: No splenomegaly. No focal mass lesion. Adrenals/Urinary Tract: No adrenal nodule or mass. Tiny stones are seen in the right kidney with mild, asymmetrically decreased perfusion of the right kidney. Right double-J internal ureteral stent is new in the interval and right-sided hydronephrosis has decreased since prior study. There is persistent high attenuation material in the right renal pelvis consistent with known urothelial neoplasm. Similar appearance of the left lower pole renal cyst. Left ureter unremarkable. The urinary bladder appears normal for the degree of distention. Stomach/Bowel: Stomach is nondistended. No gastric wall thickening. No evidence of outlet obstruction. Duodenum is normally positioned as is the ligament of Treitz. No small bowel wall thickening. No small bowel dilatation. Status post right hemicolectomy. Vascular/Lymphatic: There is abdominal  aortic atherosclerosis without aneurysm. Small lymph nodes in the hepato duodenal ligament and retroperitoneal space are stable. No pelvic sidewall lymphadenopathy. Upper normal groin lymph nodes are evident bilaterally. Reproductive: Prostate gland unremarkable. Other: Small volume intraperitoneal free fluid is associated with diffuse mesenteric and body wall edema. Musculoskeletal: No worrisome lytic or sclerotic osseous  abnormality. IMPRESSION: 1. Patchy areas of tree-in-bud nodularity in the left lung with some areas of associated more confluent nodularity. Appearance is most likely related to infectious/inflammatory etiology and atypical infection would be a consideration. Given the history of colon cancer, follow-up recommended to ensure resolution. 2. Mild subcarinal lymphadenopathy, potentially reactive. This can also be further assessed at the time of follow-up. 3. Bibasilar collapse/consolidation with small bilateral pleural effusions. 4. Persistent soft tissue fullness in the right renal pelvis consistent with known urothelial neoplasm. Right internal ureteral stent is new in the interval. 5. Small volume intraperitoneal free fluid with mesenteric and body wall edema associated. 6.  Aortic Atherosclerois (ICD10-170.0) Electronically Signed   By: Misty Stanley M.D.   On: 05/07/2018 16:15   US Renal  Result Date: 04/27/2018 CLINICAL DATA:  History of right hydronephrosis, follow-up EXAM: RENAL / URINARY TRACT ULTRASOUND COMPLETE COMPARISON:  CT abdomen pelvis of 04/25/2017 FINDINGS: Right Kidney: Length: 10.8 cm. Only slight fullness of the renal calices is seen. The right renal pelvis is not significantly dilated. This represents and improvement when compared to the CT images of 04/25/2017. The renal parenchyma is somewhat echogenic which may indicate chronic renal medical disease. Correlate with renal laboratory values. Left Kidney: Length: 12.8 cm.  No hydronephrosis is seen. Bladder: The urinary  bladder is decompressed with Foley catheter in place. IMPRESSION: 1. Slight fullness of the right renal calices is noted which represents and improvement compared to the CT images recently. 2. No left hydronephrosis is seen. 3. Somewhat echogenic right renal parenchyma. Correlate with renal laboratory values. Electronically Signed   By: Ivar Drape M.D.   On: 04/27/2018 17:04   Dg Abd Portable 1v  Result Date: 04/30/2018 CLINICAL DATA:  NG tube placement EXAM: PORTABLE ABDOMEN - 1 VIEW COMPARISON:  04/30/2018 FINDINGS: Esophageal tube tip has been advanced, somewhat abrupt turn to the left distally. Consolidation at the left base. Right-sided ureteral stent. Mild diffuse increased small and large bowel gas. IMPRESSION: 1. Esophageal tube tip with slightly abrupt turn to the left distally, not sure if this represents kinking of the tube at the gastric outlet or projection of the tube over the proximal duodenum. 2. Consolidation at the left base. 3. Mild diffuse increased bowel gas, possible ileus Electronically Signed   By: Donavan Foil M.D.   On: 04/30/2018 16:45   Dg Abd Portable 1v  Result Date: 04/30/2018 CLINICAL DATA:  NG tube placement EXAM: PORTABLE ABDOMEN - 1 VIEW COMPARISON:  CT 04/25/2018 FINDINGS: Left basilar consolidation. Esophageal tube tip projects over GE junction. Mild diffuse gaseous enlargement of the bowel. IMPRESSION: Esophageal tube tip overlies the GE junction. Consolidation at the left lung base. Mild diffuse increased bowel gas throughout, possible ileus Electronically Signed   By: Donavan Foil M.D.   On: 04/30/2018 16:44    ASSESSMENT & PLAN:   Ureteral cancer, right ([place]) # High-grade urothelial cancer/cytology; likely of the right renal pelvis /upper ureter.  Await PET scan as ordered for next week.  No obvious metastases noted on the CT of the chest abdomen pelvis otherwise.  # ? colon cancer status post hemicolectomy-question 1 possible lymph node as per patient.  We  will try to get the records from [place].  # Hepatitis C-check hepatitis RNA  #Active smoker-patient not interested in smoking cessation.  # Stroke affecting L side-stable.  #Patient's medical history/social history is quite complicated-after the PET scan is available; will discuss with urology the next plan of care.  #Disposition:  Labs today CBC CMP/hepatitis RNA; iron studies LDH ferritin.  #Follow-up in 10 days.   # Thank you Dr.Brandon for allowing me to participate in the care of your pleasant patient. Please do not hesitate to contact me with questions or concerns in the interim.   All questions were answered. The patient knows to call the clinic with any problems, questions or concerns.     Cammie Sickle, MD 05/26/2018 4:08 PM

## 2018-05-27 ENCOUNTER — Other Ambulatory Visit: Payer: Self-pay | Admitting: Internal Medicine

## 2018-05-27 ENCOUNTER — Telehealth: Payer: Self-pay | Admitting: Internal Medicine

## 2018-05-27 DIAGNOSIS — D500 Iron deficiency anemia secondary to blood loss (chronic): Secondary | ICD-10-CM | POA: Insufficient documentation

## 2018-05-27 NOTE — Telephone Encounter (Signed)
Please schedule a follow-up in approximately 10 days; IV Venofer/MD-DrB

## 2018-05-28 LAB — HEPATITIS C VRS RNA DETECT BY PCR-QUAL: HEPATITIS C VRS RNA BY PCR-QUAL: POSITIVE — AB

## 2018-05-31 ENCOUNTER — Ambulatory Visit
Admission: RE | Admit: 2018-05-31 | Discharge: 2018-05-31 | Disposition: A | Discharge status: Home / Self Care | Payer: Medicaid Other | Source: Ambulatory Visit | Attending: Urology | Admitting: Urology

## 2018-05-31 DIAGNOSIS — I700 Atherosclerosis of aorta: Secondary | ICD-10-CM | POA: Insufficient documentation

## 2018-05-31 DIAGNOSIS — C689 Malignant neoplasm of urinary organ, unspecified: Secondary | ICD-10-CM | POA: Diagnosis not present

## 2018-05-31 LAB — GLUCOSE, CAPILLARY: Glucose-Capillary: 75 mg/dL (ref 70–99)

## 2018-05-31 MED ORDER — FLUDEOXYGLUCOSE F - 18 (FDG) INJECTION
9.3800 | Freq: Once | INTRAVENOUS | Status: AC | PRN
  Administered 2018-05-31: 9.38 via INTRAVENOUS

## 2018-06-03 ENCOUNTER — Encounter: Payer: Self-pay | Admitting: Urology

## 2018-06-03 ENCOUNTER — Ambulatory Visit (INDEPENDENT_AMBULATORY_CARE_PROVIDER_SITE_OTHER): Payer: Medicaid Other | Admitting: Urology

## 2018-06-03 ENCOUNTER — Other Ambulatory Visit: Payer: Self-pay | Admitting: Radiology

## 2018-06-03 ENCOUNTER — Telehealth: Payer: Self-pay | Admitting: Radiology

## 2018-06-03 VITALS — BP 124/79 | HR 80 | Ht 67.0 in | Wt 153.0 lb

## 2018-06-03 DIAGNOSIS — R590 Localized enlarged lymph nodes: Secondary | ICD-10-CM

## 2018-06-03 DIAGNOSIS — C689 Malignant neoplasm of urinary organ, unspecified: Secondary | ICD-10-CM

## 2018-06-03 DIAGNOSIS — R350 Frequency of micturition: Secondary | ICD-10-CM | POA: Diagnosis not present

## 2018-06-03 DIAGNOSIS — Z85038 Personal history of other malignant neoplasm of large intestine: Secondary | ICD-10-CM | POA: Diagnosis not present

## 2018-06-03 LAB — BLADDER SCAN AMB NON-IMAGING

## 2018-06-03 NOTE — Progress Notes (Signed)
06/03/2018 8:47 AM   Chad [name] 1961/03/03 [id number]  Referring provider: Lavera [name], [place], [place] [address]  Chief Complaint  Patient presents with  . Follow-up    HPI: 57 year old male with a personal history of newly diagnosed high-grade upper tract urothelial carcinoma as well as a personal history of colon cancer returns to the office following PET scan for further staging.  PET shows mild to moderate increased uptake within bilateral axillary nodes, bilateral external iliac and inguinal lymph nodes.  Per the report, the most hyperintense metabolic lymph node is in the right axilla with an SUV of 6.8.  There is also asymmetric increased uptake within the right renal pelvis at the site of his known urothelial carcinoma as well as proximal ureter.  The patient is experiencing intermittent gross hematuria and urinary frequency.  He denies dysuria, fevers or chills.  This is unchanged.  He still living [employer].  He goes out for outings with his son who was present with him today.  His primary complaint today is left knee pain.   PMH: Past Medical History:  Diagnosis Date  . Anxiety   . Cancer [facility])    prostate  . Depression   . Hyperlipidemia   . Nerve pain   . Stroke [facility])     Surgical History: Past Surgical History:  Procedure Laterality Date  . COLON SURGERY     En bloc extended right hemicolectomy 07/2017  . CYSTOSCOPY WITH STENT PLACEMENT Right 04/25/2018   Procedure: CYSTOSCOPY WITH STENT PLACEMENT;  Surgeon: Hollice Espy, MD;  Location: [facility] ORS;  Service: Urology;  Laterality: Right;  . tumor removed       Home Medications:  Allergies as of 06/03/2018      Reactions   Penicillins Rash      Medication List        Accurate as of 06/03/18  8:47 AM. Always use your most recent med list.          atorvastatin 80 MG tablet Commonly known as:  LIPITOR Take 1 tablet (80 mg total) by mouth daily.   escitalopram 20  MG tablet Commonly known as:  LEXAPRO Take 1 tablet (20 mg total) by mouth daily.   gabapentin 100 MG capsule Commonly known as:  NEURONTIN Take one to 2 tabs for pain 3 x day   ibuprofen 200 MG tablet Commonly known as:  ADVIL,MOTRIN Take 200 mg by mouth every 6 (six) hours as needed.   pantoprazole sodium 40 mg/20 mL Pack Commonly known as:  PROTONIX Take 20 mLs (40 mg total) by mouth daily at 12 noon.   QUEtiapine 50 MG tablet Commonly known as:  SEROQUEL Take 1 tablet (50 mg total) by mouth at bedtime.       Allergies:  Allergies  Allergen Reactions  . Penicillins Rash    Family History: Family History  Problem Relation Age of Onset  . Prostate cancer Father     Social History:  reports that he has been smoking cigarettes. He has been smoking about 1.00 pack per day. He has never used smokeless tobacco. He reports that he does not drink alcohol or use drugs.  ROS: UROLOGY Frequent Urination?: Yes Hard to postpone urination?: Yes Burning/pain with urination?: No Get up at night to urinate?: Yes Leakage of urine?: Yes Urine stream starts and stops?: No Trouble starting stream?: No Do you have to strain to urinate?: No Blood in urine?: Yes Urinary tract infection?: No Sexually  transmitted disease?: No Injury to kidneys or bladder?: Yes Painful intercourse?: No Weak stream?: No Erection problems?: No Penile pain?: No  Gastrointestinal Nausea?: No Vomiting?: No Indigestion/heartburn?: No Diarrhea?: No Constipation?: No  Constitutional Fever: No Night sweats?: No Weight loss?: No Fatigue?: No  Skin Skin rash/lesions?: No Itching?: No  Eyes Blurred vision?: No Double vision?: No  Ears/Nose/Throat Sore throat?: No Sinus problems?: No  Hematologic/Lymphatic Swollen glands?: No Easy bruising?: Yes  Cardiovascular Leg swelling?: No Chest pain?: No  Respiratory Cough?: No Shortness of breath?: No  Endocrine Excessive thirst?:  No  Musculoskeletal Back pain?: No Joint pain?: No  Neurological Headaches?: No Dizziness?: No  Psychologic Depression?: Yes Anxiety?: Yes  Physical Exam: BP 124/79 (BP Location: Left Arm)   Pulse 80   Ht 5\' 7"  (1.702 m)   Wt 153 lb (69.4 kg)   BMI 23.96 kg/m   Constitutional:  Alert and oriented, No acute distress.  Disheveled.  In wheelchair.  P by son today. HEENT: [place] AT, moist mucus membranes.  Trachea midline, no masses. Cardiovascular: No clubbing, cyanosis, or edema. Respiratory: Normal respiratory effort, no increased work of breathing. Lymph: Right axilla palpated, node not easily identified. Skin: No rashes, bruises or suspicious lesions. Neurologic: Grossly intact, no focal deficits, moving all 4 extremities. Psychiatric: Normal mood and affect.  Laboratory Data: Lab Results  Component Value Date   WBC 4.9 05/26/2018   HGB 9.1 (L) 05/26/2018   HCT 29.8 (L) 05/26/2018   MCV 70.6 (L) 05/26/2018   PLT 182 05/26/2018    Lab Results  Component Value Date   CREATININE 0.95 05/26/2018     Urinalysis NA  Pertinent Imaging: CLINICAL DATA:  Subsequent. Treatment strategy for urothelial carcinoma.  EXAM: NUCLEAR MEDICINE PET SKULL BASE TO THIGH  TECHNIQUE: 9.38 mCi F-18 FDG was injected intravenously. Full-ring PET imaging was performed from the skull base to thigh after the radiotracer. CT data was obtained and used for attenuation correction and anatomic localization.  Fasting blood glucose: 75 mg/dl  COMPARISON:  CT chest abdomen and pelvis from 05/07/2018  FINDINGS: Mediastinal blood pool activity: SUV max 2.16  NECK: No hypermetabolic lymph nodes in the neck.  Incidental CT findings: none  CHEST: Mildly hypermetabolic bilateral axillary lymph nodes are identified. The index right axillary lymph node measures 1 cm short axis and has an SUV max of 6.8. Left axillary lymph node measures 1.1 cm and has an SUV max of 4.09.  No  hypermetabolic supraclavicular, mediastinal or hilar lymph nodes. No pleural effusion identified.No hypermetabolic pulmonary nodules or masses identified.  Incidental CT findings: Calcification in the LAD, left main coronary arteries noted.  ABDOMEN/PELVIS: There is no abnormal uptake identified within the liver, pancreas, or spleen. The adrenal glands are unremarkable.  No hypermetabolic lymph nodes identified within the abdomen. Mildly hypermetabolic bilateral external iliac and bilateral inguinal lymph nodes are identified.  - left pelvic sidewall lymph node measures 1.3 cm and has an SUV max of 3.30.  - right external iliac lymph node is hypermetabolic measuring 1.2 cm within SUV max of 3.73.  - index right inguinal lymph node measures 1.5 cm and has an SUV max of 3.28.  -  left inguinal lymph node measures 1.7 cm within SUV max of 3.11.  Incidental CT findings: Aortic atherosclerosis identified. No aneurysm. Right sided nephroureteral stent is in place. If hypermetabolic soft tissue within the right renal pelvis and proximal right ureter is again noted compatible with urothelial lesion.  SKELETON: No focal hypermetabolic  activity to suggest skeletal metastasis.  Incidental CT findings: none  IMPRESSION: 1. Mild to moderate increased FDG uptake is associated with bilateral axillary, bilateral external iliac and bilateral inguinal lymph nodes. The most intensely hypermetabolic lymph node is in the right axillary region with an SUV max of 6.8. The hypermetabolic right axillary lymph node should be easily amendable to ultrasound-guided percutaneous tissue sampling. 2. Asymmetric increased soft tissue within the right renal pelvis and proximal right ureter compatible with urothelial neoplasm. Status post right double-J nephroureteral stent placement. 3.  Aortic Atherosclerosis (ICD10-I70.0).   Electronically Signed   By: Kerby Moors M.D.   On:  05/31/2018 13:22  PET scan personally reviewed today.  Agree with radiologic interpretation.  Results for orders placed or performed in visit on 06/03/18  Bladder Scan (Post Void Residual) in office  Result Value Ref Range   Scan Result 95 ml      Assessment & Plan:    1. Urothelial cancer ([place]) Right high-grade upper tract urothelial carcinoma PET scan reviewed, there is evidence of inguinal and axillary lymphadenopathy as well as pelvic adenopathy.  Pelvic sidewall adenopathy is more concerning for metastatic disease. Case was discussed with Dr. [name], plan for right also guided axillary lymph node biopsy.  Discussed this with patient he is agreeable this plan. Whether or not to proceed with surgery will depend on his metastatic work-up as outlined above.  2. History of colon cancer Have yet to receive records, patient reports that these are at [facility] medical Seeing Dr. [name] Monday  3. Axillary adenopathy As above - US Guided Needle Placement; Future  4. Urinary frequency Likely secondary to stent discomfort Unable to provide UA today, low suspicion for UTI Adequate bladder emptying today - Bladder Scan (Post Void Residual) in office  Notably today, the patient asked if he should have his flu shot.  I strongly recommended this.  Follow-up TBD  Hollice Espy, MD  [facility] [address], [place], [place] [address] [phone]  Case was personally discussed with his oncologist, Dr. [name].

## 2018-06-03 NOTE — Telephone Encounter (Signed)
Notified patient of lymph node biopsy scheduled 06/08/2018 at 12:30. Advised patient to have nothing to eat or drink after midnight on the night prior to surgery. Patient voices understanding & states [facility] needs to be notified with instructions.  Spoke to [place] at [facility]. Notified of appointment & instructions to be npo after midnight. Chad [name] voices understanding of these instructions. Advised that IR RN will likely call to give further instructions prior to procedure. [facility] [place] states they can be reached at [phone] & ask to speak to patient's nurse.

## 2018-06-04 ENCOUNTER — Other Ambulatory Visit: Payer: Self-pay | Admitting: Internal Medicine

## 2018-06-04 DIAGNOSIS — D500 Iron deficiency anemia secondary to blood loss (chronic): Secondary | ICD-10-CM

## 2018-06-07 ENCOUNTER — Other Ambulatory Visit: Payer: Self-pay | Admitting: Radiology

## 2018-06-07 ENCOUNTER — Telehealth: Payer: Self-pay | Admitting: Internal Medicine

## 2018-06-07 ENCOUNTER — Inpatient Hospital Stay: Payer: Medicaid Other

## 2018-06-07 ENCOUNTER — Inpatient Hospital Stay (HOSPITAL_BASED_OUTPATIENT_CLINIC_OR_DEPARTMENT_OTHER): Payer: Medicaid Other | Admitting: Internal Medicine

## 2018-06-07 ENCOUNTER — Other Ambulatory Visit: Payer: Self-pay

## 2018-06-07 ENCOUNTER — Inpatient Hospital Stay: Payer: Medicaid Other | Attending: Internal Medicine

## 2018-06-07 VITALS — BP 111/74 | HR 89 | Resp 16

## 2018-06-07 DIAGNOSIS — R5381 Other malaise: Secondary | ICD-10-CM | POA: Insufficient documentation

## 2018-06-07 DIAGNOSIS — Z9049 Acquired absence of other specified parts of digestive tract: Secondary | ICD-10-CM | POA: Insufficient documentation

## 2018-06-07 DIAGNOSIS — Z8042 Family history of malignant neoplasm of prostate: Secondary | ICD-10-CM | POA: Insufficient documentation

## 2018-06-07 DIAGNOSIS — R590 Localized enlarged lymph nodes: Secondary | ICD-10-CM

## 2018-06-07 DIAGNOSIS — B1920 Unspecified viral hepatitis C without hepatic coma: Secondary | ICD-10-CM

## 2018-06-07 DIAGNOSIS — R600 Localized edema: Secondary | ICD-10-CM | POA: Diagnosis not present

## 2018-06-07 DIAGNOSIS — R05 Cough: Secondary | ICD-10-CM | POA: Insufficient documentation

## 2018-06-07 DIAGNOSIS — C661 Malignant neoplasm of right ureter: Secondary | ICD-10-CM | POA: Diagnosis not present

## 2018-06-07 DIAGNOSIS — M25462 Effusion, left knee: Secondary | ICD-10-CM | POA: Insufficient documentation

## 2018-06-07 DIAGNOSIS — E46 Unspecified protein-calorie malnutrition: Secondary | ICD-10-CM | POA: Diagnosis not present

## 2018-06-07 DIAGNOSIS — F17210 Nicotine dependence, cigarettes, uncomplicated: Secondary | ICD-10-CM | POA: Insufficient documentation

## 2018-06-07 DIAGNOSIS — M549 Dorsalgia, unspecified: Secondary | ICD-10-CM | POA: Diagnosis not present

## 2018-06-07 DIAGNOSIS — R531 Weakness: Secondary | ICD-10-CM | POA: Insufficient documentation

## 2018-06-07 DIAGNOSIS — D509 Iron deficiency anemia, unspecified: Secondary | ICD-10-CM | POA: Diagnosis not present

## 2018-06-07 DIAGNOSIS — I69854 Hemiplegia and hemiparesis following other cerebrovascular disease affecting left non-dominant side: Secondary | ICD-10-CM | POA: Insufficient documentation

## 2018-06-07 DIAGNOSIS — R5383 Other fatigue: Secondary | ICD-10-CM | POA: Diagnosis not present

## 2018-06-07 DIAGNOSIS — C189 Malignant neoplasm of colon, unspecified: Secondary | ICD-10-CM | POA: Diagnosis not present

## 2018-06-07 DIAGNOSIS — M25562 Pain in left knee: Secondary | ICD-10-CM

## 2018-06-07 DIAGNOSIS — I700 Atherosclerosis of aorta: Secondary | ICD-10-CM | POA: Insufficient documentation

## 2018-06-07 DIAGNOSIS — B182 Chronic viral hepatitis C: Secondary | ICD-10-CM

## 2018-06-07 DIAGNOSIS — Z79899 Other long term (current) drug therapy: Secondary | ICD-10-CM

## 2018-06-07 DIAGNOSIS — R319 Hematuria, unspecified: Secondary | ICD-10-CM | POA: Diagnosis not present

## 2018-06-07 DIAGNOSIS — R0602 Shortness of breath: Secondary | ICD-10-CM | POA: Diagnosis not present

## 2018-06-07 DIAGNOSIS — F418 Other specified anxiety disorders: Secondary | ICD-10-CM

## 2018-06-07 DIAGNOSIS — Z8673 Personal history of transient ischemic attack (TIA), and cerebral infarction without residual deficits: Secondary | ICD-10-CM

## 2018-06-07 DIAGNOSIS — Z5112 Encounter for antineoplastic immunotherapy: Secondary | ICD-10-CM | POA: Insufficient documentation

## 2018-06-07 DIAGNOSIS — Z515 Encounter for palliative care: Secondary | ICD-10-CM | POA: Diagnosis not present

## 2018-06-07 DIAGNOSIS — Z85038 Personal history of other malignant neoplasm of large intestine: Secondary | ICD-10-CM | POA: Insufficient documentation

## 2018-06-07 DIAGNOSIS — R1310 Dysphagia, unspecified: Secondary | ICD-10-CM | POA: Insufficient documentation

## 2018-06-07 DIAGNOSIS — E785 Hyperlipidemia, unspecified: Secondary | ICD-10-CM

## 2018-06-07 DIAGNOSIS — D500 Iron deficiency anemia secondary to blood loss (chronic): Secondary | ICD-10-CM

## 2018-06-07 LAB — CBC WITH DIFFERENTIAL/PLATELET
Abs Immature Granulocytes: 0.01 10*3/uL (ref 0.00–0.07)
BASOS ABS: 0.1 10*3/uL (ref 0.0–0.1)
BASOS PCT: 1 %
EOS ABS: 0.5 10*3/uL (ref 0.0–0.5)
Eosinophils Relative: 9 %
HCT: 28.8 % — ABNORMAL LOW (ref 39.0–52.0)
Hemoglobin: 8.6 g/dL — ABNORMAL LOW (ref 13.0–17.0)
Immature Granulocytes: 0 %
Lymphocytes Relative: 17 %
Lymphs Abs: 0.9 10*3/uL (ref 0.7–4.0)
MCH: 21.2 pg — ABNORMAL LOW (ref 26.0–34.0)
MCHC: 29.9 g/dL — AB (ref 30.0–36.0)
MCV: 70.9 fL — ABNORMAL LOW (ref 80.0–100.0)
Monocytes Absolute: 0.4 10*3/uL (ref 0.1–1.0)
Monocytes Relative: 8 %
NRBC: 0 % (ref 0.0–0.2)
Neutro Abs: 3.3 10*3/uL (ref 1.7–7.7)
Neutrophils Relative %: 65 %
PLATELETS: 187 10*3/uL (ref 150–400)
RBC: 4.06 MIL/uL — AB (ref 4.22–5.81)
RDW: 26.4 % — AB (ref 11.5–15.5)
WBC: 5.1 10*3/uL (ref 4.0–10.5)

## 2018-06-07 LAB — COMPREHENSIVE METABOLIC PANEL
ALK PHOS: 97 U/L (ref 38–126)
ALT: 34 U/L (ref 0–44)
ANION GAP: 8 (ref 5–15)
AST: 56 U/L — ABNORMAL HIGH (ref 15–41)
Albumin: 3.2 g/dL — ABNORMAL LOW (ref 3.5–5.0)
BILIRUBIN TOTAL: 0.5 mg/dL (ref 0.3–1.2)
BUN: 16 mg/dL (ref 6–20)
CALCIUM: 8.3 mg/dL — AB (ref 8.9–10.3)
CO2: 21 mmol/L — AB (ref 22–32)
Chloride: 106 mmol/L (ref 98–111)
Creatinine, Ser: 1.02 mg/dL (ref 0.61–1.24)
Glucose, Bld: 116 mg/dL — ABNORMAL HIGH (ref 70–99)
Potassium: 3.9 mmol/L (ref 3.5–5.1)
SODIUM: 135 mmol/L (ref 135–145)
TOTAL PROTEIN: 7.3 g/dL (ref 6.5–8.1)

## 2018-06-07 LAB — IRON AND TIBC
Iron: 16 ug/dL — ABNORMAL LOW (ref 45–182)
SATURATION RATIOS: 4 % — AB (ref 17.9–39.5)
TIBC: 439 ug/dL (ref 250–450)
UIBC: 423 ug/dL

## 2018-06-07 LAB — FERRITIN: Ferritin: 10 ng/mL — ABNORMAL LOW (ref 24–336)

## 2018-06-07 LAB — LACTATE DEHYDROGENASE: LDH: 154 U/L (ref 98–192)

## 2018-06-07 MED ORDER — SODIUM CHLORIDE 0.9 % IV SOLN
Freq: Once | INTRAVENOUS | Status: AC
  Administered 2018-06-07: 12:00:00 via INTRAVENOUS
  Filled 2018-06-07: qty 250

## 2018-06-07 MED ORDER — ACETAMINOPHEN 325 MG PO TABS
650.0000 mg | ORAL_TABLET | Freq: Once | ORAL | Status: AC
  Administered 2018-06-07: 650 mg via ORAL
  Filled 2018-06-07: qty 2

## 2018-06-07 MED ORDER — ACETAMINOPHEN 325 MG PO TABS: 650.0000 mg | ORAL_TABLET | Freq: Once | ORAL | Status: DC

## 2018-06-07 MED ORDER — IRON SUCROSE 20 MG/ML IV SOLN
200.0000 mg | Freq: Once | INTRAVENOUS | Status: AC
  Administered 2018-06-07: 200 mg via INTRAVENOUS
  Filled 2018-06-07: qty 10

## 2018-06-07 NOTE — Assessment & Plan Note (Addendum)
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  Nov 2019- PET shows- right ureteral uptake; and also shows in axilla/abdomen/pelvic adenopathy- suspicious for metastatic disease.   # ? colon cancer status post hemicolectomy-question 1 possible lymph node as per patient.  Still awaiting for records.  #Given the abnormal PET scan-recommend axillary lymph node biopsy; planned for November 12.   # Iron deficiency anemia-hemoglobin 8.7-recent colon cancer/urologic loss-proceed with IV Venofer today; and weekly x4.  #Left knee pain status post recent steroid injection.  Recommend Tylenol today.  #Spoke to patient's son [name] over the phone regarding our plan.   # DISPOSITION: # IV venofer today. # follow up in 1 week- MD/IV venofer- cbc/CEA-Dr.B

## 2018-06-07 NOTE — Progress Notes (Signed)
Chad [name] NOTE  Patient Care Team: [name], MD as PCP - General (Internal Medicine)  CHIEF COMPLAINTS/PURPOSE OF CONSULTATION:  Urothelial cancer  #  Oncology History   # SEP-OCT 2019-right renal pelvis/ ureteral [cytology positive HIGH grade urothelial carcinoma [Dr.Brandon]  s/p stent placement  #Right ureteral obstruction status post stent placement  # March 2019 [ [facility]]  ? Colectomy ?  # Hep C/ # stroke of left side/weakness-2018 Nov [[place]]; active smoker  DIAGNOSIS: [ ]   STAGE:       ;GOALS:  CURRENT/MOST RECENT THERAPY [ ]       Urothelial cancer ([place])    Initial Diagnosis    Urothelial cancer ([place])     Ureteral cancer, right ([place])   05/26/2018 Initial Diagnosis    Ureteral cancer, right ([place])      HISTORY OF PRESENTING ILLNESS: Patient is a poor historian.  Is alone. Chad [name] 57 y.o.  male with above multiple medical problems is here today with results of his recent PET scan that was ordered for his history of right ureteral malignancy/recent history of colon cancer.  Patient currently complains of pain in his left knee.  States he had a recent steroid injection.  Intermittent blood in urine.  Review of Systems  Constitutional: Positive for malaise/fatigue. Negative for chills, diaphoresis, fever and weight loss.  HENT: Negative for nosebleeds and sore throat.   Eyes: Negative for double vision.  Respiratory: Negative for cough, hemoptysis, sputum production, shortness of breath and wheezing.   Cardiovascular: Negative for chest pain, palpitations, orthopnea and leg swelling.  Gastrointestinal: Negative for abdominal pain, blood in stool, constipation, diarrhea, heartburn, melena, nausea and vomiting.  Genitourinary: Positive for hematuria. Negative for dysuria, frequency and urgency.  Musculoskeletal: Positive for back pain and joint pain.  Skin: Negative.  Negative for itching and rash.  Neurological: Positive for  focal weakness. Negative for dizziness, tingling, weakness and headaches.       Chronic left-sided weakness upper than lower extremity.  Endo/Heme/Allergies: Does not bruise/bleed easily.  Psychiatric/Behavioral: Negative for depression. The patient is not nervous/anxious and does not have insomnia.      MEDICAL HISTORY:  Past Medical History:  Diagnosis Date  . Anxiety   . Cancer [facility])    prostate  . Depression   . Hyperlipidemia   . Nerve pain   . Stroke [facility])     SURGICAL HISTORY: Past Surgical History:  Procedure Laterality Date  . COLON SURGERY     En bloc extended right hemicolectomy 07/2017  . CYSTOSCOPY WITH STENT PLACEMENT Right 04/25/2018   Procedure: CYSTOSCOPY WITH STENT PLACEMENT;  Surgeon: Hollice Espy, MD;  Location: [facility] ORS;  Service: Urology;  Laterality: Right;  . tumor removed       SOCIAL HISTORY: Social History   Socioeconomic History  . Marital status: Single    Spouse name: Not on file  . Number of children: Not on file  . Years of education: Not on file  . Highest education level: Not on file  Occupational History  . Not on file  Social Needs  . Financial resource strain: Not on file  . Food insecurity:    Worry: Not on file    Inability: Not on file  . Transportation needs:    Medical: Not on file    Non-medical: Not on file  Tobacco Use  . Smoking status: Current Every Day Smoker    Packs/day: 1.00    Types: Cigarettes  .  Smokeless tobacco: Never Used  Substance and Sexual Activity  . Alcohol use: Never    Frequency: Never  . Drug use: Never  . Sexual activity: Not on file  Lifestyle  . Physical activity:    Days per week: Not on file    Minutes per session: Not on file  . Stress: Not on file  Relationships  . Social connections:    Talks on phone: Not on file    Gets together: Not on file    Attends religious service: Not on file    Active member of club or organization: Not on file    Attends meetings of clubs or  organizations: Not on file    Relationship status: Not on file  . Intimate partner violence:    Fear of current or ex partner: Not on file    Emotionally abused: Not on file    Physically abused: Not on file    Forced sexual activity: Not on file  Other Topics Concern  . Not on file  Social History Narrative    used to live [place]; moved  To [place]- end of April 2019; in Nursing home; 1pp/day; quit alcohol. 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     FAMILY HISTORY: Family History  Problem Relation Age of Onset  . Prostate cancer Father     ALLERGIES:  is allergic to penicillins.  MEDICATIONS:  Current Outpatient Medications  Medication Sig Dispense Refill  . atorvastatin (LIPITOR) 80 MG tablet Take 1 tablet (80 mg total) by mouth daily. 90 tablet 3  . escitalopram (LEXAPRO) 20 MG tablet Take 1 tablet (20 mg total) by mouth daily. 90 tablet 3  . gabapentin (NEURONTIN) 100 MG capsule Take one to 2 tabs for pain 3 x day 180 capsule 3  . ibuprofen (ADVIL,MOTRIN) 200 MG tablet Take 200 mg by mouth every 6 (six) hours as needed.    . pantoprazole sodium (PROTONIX) 40 mg/20 mL PACK Take 20 mLs (40 mg total) by mouth daily at 12 noon. 30 each   . QUEtiapine (SEROQUEL) 50 MG tablet Take 1 tablet (50 mg total) by mouth at bedtime. 90 tablet 3   Current Facility-Administered Medications  Medication Dose Route Frequency Provider Last Rate Last Dose  . acetaminophen (TYLENOL) tablet 650 mg  650 mg Oral Once Cammie Sickle, MD          .  PHYSICAL EXAMINATION: ECOG PERFORMANCE STATUS: 1 - Symptomatic but completely ambulatory  Vitals:   06/07/18 1121  BP: 111/74  Pulse: 89  Resp: 16   Filed Weights    Physical Exam  Constitutional: He is oriented to person, place, and time.  In wheelchair. Cachectic.  Appears older than his stated age.  Alone.  HENT:  Head: Normocephalic and atraumatic.  Mouth/Throat: Oropharynx is clear and moist. No oropharyngeal exudate.  Eyes: Pupils are  equal, round, and reactive to light.  Neck: Normal range of motion. Neck supple.  Cardiovascular: Normal rate and regular rhythm.  Pulmonary/Chest: No respiratory distress. He has no wheezes.  Abdominal: Soft. Bowel sounds are normal. He exhibits no distension and no mass. There is no tenderness. There is no rebound and no guarding.  Musculoskeletal: Normal range of motion. He exhibits no edema or tenderness.  Neurological: He is alert and oriented to person, place, and time.  Weakness left upper and lower extremity.  Skin: Skin is warm.  Psychiatric: Affect normal.     LABORATORY DATA:  I have reviewed the data  as listed Lab Results  Component Value Date   WBC 5.1 06/07/2018   HGB 8.6 (L) 06/07/2018   HCT 28.8 (L) 06/07/2018   MCV 70.9 (L) 06/07/2018   PLT 187 06/07/2018   Recent Labs    04/26/18 0540  05/01/18 0547 05/26/18 1546 06/07/18 1049  NA 136   < > 147* 138 135  K 3.2*   < > 3.8 3.8 3.9  CL 108   < > 114* 109 106  CO2 20*   < > 27 22 21*  GLUCOSE 148*   < > 120* 125* 116*  BUN 92*   < > 44* 13 16  CREATININE 3.04*   < > 1.17 0.95 1.02  CALCIUM 6.7*   < > 7.5* 8.6* 8.3*  GFRNONAA 21*   < > >60 >60 >60  GFRAA 24*   < > >60 >60 >60  PROT 5.5*  --   --  7.6 7.3  ALBUMIN 2.3*  --   --  3.1* 3.2*  AST 509*  --   --  31 56*  ALT 157*  --   --  16 34  ALKPHOS 69  --   --  113 97  BILITOT 0.8  --   --  0.4 0.5   < > = values in this interval not displayed.    RADIOGRAPHIC STUDIES: I have personally reviewed the radiological images as listed and agreed with the findings in the report. Nm Pet Image Initial (pi) Skull Base To Thigh  Result Date: 05/31/2018 CLINICAL DATA:  Subsequent. Treatment strategy for urothelial carcinoma. EXAM: NUCLEAR MEDICINE PET SKULL BASE TO THIGH TECHNIQUE: 9.38 mCi F-18 FDG was injected intravenously. Full-ring PET imaging was performed from the skull base to thigh after the radiotracer. CT data was obtained and used for attenuation  correction and anatomic localization. Fasting blood glucose: 75 mg/dl COMPARISON:  CT chest abdomen and pelvis from 05/07/2018 FINDINGS: Mediastinal blood pool activity: SUV max 2.16 NECK: No hypermetabolic lymph nodes in the neck. Incidental CT findings: none CHEST: Mildly hypermetabolic bilateral axillary lymph nodes are identified. The index right axillary lymph node measures 1 cm short axis and has an SUV max of 6.8. Left axillary lymph node measures 1.1 cm and has an SUV max of 4.09. No hypermetabolic supraclavicular, mediastinal or hilar lymph nodes. No pleural effusion identified.No hypermetabolic pulmonary nodules or masses identified. Incidental CT findings: Calcification in the LAD, left main coronary arteries noted. ABDOMEN/PELVIS: There is no abnormal uptake identified within the liver, pancreas, or spleen. The adrenal glands are unremarkable. No hypermetabolic lymph nodes identified within the abdomen. Mildly hypermetabolic bilateral external iliac and bilateral inguinal lymph nodes are identified. - left pelvic sidewall lymph node measures 1.3 cm and has an SUV max of 3.30. - right external iliac lymph node is hypermetabolic measuring 1.2 cm within SUV max of 3.73. - index right inguinal lymph node measures 1.5 cm and has an SUV max of 3.28. -  left inguinal lymph node measures 1.7 cm within SUV max of 3.11. Incidental CT findings: Aortic atherosclerosis identified. No aneurysm. Right sided nephroureteral stent is in place. If hypermetabolic soft tissue within the right renal pelvis and proximal right ureter is again noted compatible with urothelial lesion. SKELETON: No focal hypermetabolic activity to suggest skeletal metastasis. Incidental CT findings: none IMPRESSION: 1. Mild to moderate increased FDG uptake is associated with bilateral axillary, bilateral external iliac and bilateral inguinal lymph nodes. The most intensely hypermetabolic lymph node is in the right  axillary region with an SUV max  of 6.8. The hypermetabolic right axillary lymph node should be easily amendable to ultrasound-guided percutaneous tissue sampling. 2. Asymmetric increased soft tissue within the right renal pelvis and proximal right ureter compatible with urothelial neoplasm. Status post right double-J nephroureteral stent placement. 3.  Aortic Atherosclerosis (ICD10-I70.0). Electronically Signed   By: Kerby Moors M.D.   On: 05/31/2018 13:22    ASSESSMENT & PLAN:   Ureteral cancer, right ([place]) # High-grade urothelial cancer/cytology; likely of the right renal pelvis /upper ureter.  Nov 2019- PET shows- right ureteral uptake; and also shows in axilla/abdomen/pelvic adenopathy- suspicious for metastatic disease.   # ? colon cancer status post hemicolectomy-question 1 possible lymph node as per patient.  Still awaiting for records.  #Given the abnormal PET scan-recommend axillary lymph node biopsy; planned for November 12.   # Iron deficiency anemia-hemoglobin 8.7-recent colon cancer/urologic loss-proceed with IV Venofer today; and weekly x4.  #Left knee pain status post recent steroid injection.  Recommend Tylenol today.  #Spoke to patient's son [name] over the phone regarding our plan.   # DISPOSITION: # IV venofer today. # follow up in 1 week- MD/IV venofer- cbc/CEA-Dr.B   All questions were answered. The patient knows to call the clinic with any problems, questions or concerns.     Cammie Sickle, MD 06/07/2018 11:57 AM

## 2018-06-07 NOTE — Telephone Encounter (Signed)
FYI-  Spoke to patient's son-Chad [name] at [phone] regarding the PET scan; biopsy; iron deficient anemia  Some concern about patient's tolerance to " chemotherapy" if needed; will review at next visit/patient's son plans to be available at the next visit time/date given to the patient's son.

## 2018-06-08 ENCOUNTER — Ambulatory Visit
Admission: RE | Admit: 2018-06-08 | Discharge: 2018-06-08 | Disposition: A | Discharge status: Home / Self Care | Payer: Medicaid Other | Source: Ambulatory Visit | Attending: Urology | Admitting: Urology

## 2018-06-08 ENCOUNTER — Other Ambulatory Visit: Payer: Self-pay

## 2018-06-08 DIAGNOSIS — C661 Malignant neoplasm of right ureter: Secondary | ICD-10-CM | POA: Insufficient documentation

## 2018-06-08 DIAGNOSIS — Z79899 Other long term (current) drug therapy: Secondary | ICD-10-CM | POA: Insufficient documentation

## 2018-06-08 DIAGNOSIS — F419 Anxiety disorder, unspecified: Secondary | ICD-10-CM | POA: Diagnosis not present

## 2018-06-08 DIAGNOSIS — Z85038 Personal history of other malignant neoplasm of large intestine: Secondary | ICD-10-CM | POA: Insufficient documentation

## 2018-06-08 DIAGNOSIS — R590 Localized enlarged lymph nodes: Secondary | ICD-10-CM | POA: Insufficient documentation

## 2018-06-08 DIAGNOSIS — E785 Hyperlipidemia, unspecified: Secondary | ICD-10-CM | POA: Insufficient documentation

## 2018-06-08 DIAGNOSIS — F17210 Nicotine dependence, cigarettes, uncomplicated: Secondary | ICD-10-CM | POA: Insufficient documentation

## 2018-06-08 DIAGNOSIS — Z8042 Family history of malignant neoplasm of prostate: Secondary | ICD-10-CM | POA: Diagnosis not present

## 2018-06-08 DIAGNOSIS — Z880 Allergy status to penicillin: Secondary | ICD-10-CM | POA: Diagnosis not present

## 2018-06-08 DIAGNOSIS — Z8673 Personal history of transient ischemic attack (TIA), and cerebral infarction without residual deficits: Secondary | ICD-10-CM | POA: Insufficient documentation

## 2018-06-08 LAB — CBC
HCT: 30.3 % — ABNORMAL LOW (ref 39.0–52.0)
HEMOGLOBIN: 9.1 g/dL — AB (ref 13.0–17.0)
MCH: 21.4 pg — ABNORMAL LOW (ref 26.0–34.0)
MCHC: 30 g/dL (ref 30.0–36.0)
MCV: 71.1 fL — AB (ref 80.0–100.0)
Platelets: 211 10*3/uL (ref 150–400)
RBC: 4.26 MIL/uL (ref 4.22–5.81)
RDW: 26.1 % — ABNORMAL HIGH (ref 11.5–15.5)
WBC: 5.2 10*3/uL (ref 4.0–10.5)
nRBC: 0 % (ref 0.0–0.2)

## 2018-06-08 LAB — PROTIME-INR
INR: 1.11
PROTHROMBIN TIME: 14.2 s (ref 11.4–15.2)

## 2018-06-08 MED ORDER — SODIUM CHLORIDE 0.9 % IV SOLN
INTRAVENOUS | Status: DC
  Administered 2018-06-08: 13:00:00 via INTRAVENOUS

## 2018-06-08 MED ORDER — FENTANYL CITRATE (PF) 100 MCG/2ML IJ SOLN
INTRAMUSCULAR | Status: AC | PRN
  Administered 2018-06-08 (×2): 50 ug via INTRAVENOUS

## 2018-06-08 MED ORDER — MIDAZOLAM HCL 5 MG/5ML IJ SOLN
INTRAMUSCULAR | Status: AC
  Filled 2018-06-08: qty 5

## 2018-06-08 MED ORDER — MIDAZOLAM HCL 2 MG/2ML IJ SOLN
INTRAMUSCULAR | Status: AC | PRN
  Administered 2018-06-08 (×2): 1 mg via INTRAVENOUS

## 2018-06-08 MED ORDER — FENTANYL CITRATE (PF) 100 MCG/2ML IJ SOLN
INTRAMUSCULAR | Status: AC
  Filled 2018-06-08: qty 4

## 2018-06-08 NOTE — H&P (Signed)
Chief Complaint: Patient was seen in consultation today for axillary lymph node biopsy at the request of [facility]  Referring Physician(s): Brandon,Ashley  Patient Status: [facility] - Out-pt  History of Present Illness: Chad [name] is a 57 y.o. 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 PET scan has shown hypermetabolic bilateral axillary, external iliac and inguinal lymph nodes. Now presenting for US guided right axillary LN biopsy. Has been having hematuria.  Past Medical History:  Diagnosis Date  . Anxiety   . Cancer [facility])    prostate  . Depression   . Hyperlipidemia   . Nerve pain   . Stroke [facility])     Past Surgical History:  Procedure Laterality Date  . COLON SURGERY     En bloc extended right hemicolectomy 07/2017  . CYSTOSCOPY WITH STENT PLACEMENT Right 04/25/2018   Procedure: CYSTOSCOPY WITH STENT PLACEMENT;  Surgeon: Hollice Espy, MD;  Location: [facility] ORS;  Service: Urology;  Laterality: Right;  . tumor removed       Allergies: Penicillins  Medications: Prior to Admission medications   Medication Sig Start Date End Date Taking? Authorizing Provider  atorvastatin (LIPITOR) 80 MG tablet Take 1 tablet (80 mg total) by mouth daily. 11/11/17  Yes [name], MD  escitalopram (LEXAPRO) 20 MG tablet Take 1 tablet (20 mg total) by mouth daily. 11/11/17  Yes [name], MD  gabapentin (NEURONTIN) 100 MG capsule Take one to 2 tabs for pain 3 x day 11/11/17  Yes [name], MD  pantoprazole sodium (PROTONIX) 40 mg/20 mL PACK Take 20 mLs (40 mg total) by mouth daily at 12 noon. 05/07/18  Yes Sudini, Alveta Heimlich, MD  QUEtiapine (SEROQUEL) 50 MG tablet Take 1 tablet (50 mg total) by mouth at bedtime. 11/11/17  Yes [name], MD  ibuprofen (ADVIL,MOTRIN) 200 MG tablet Take 200 mg by mouth every 6 (six) hours as needed.    [provider]     Family History  Problem Relation Age of Onset  .  Prostate cancer Father     Social History   Socioeconomic History  . Marital status: Single    Spouse name: Not on file  . Number of children: Not on file  . Years of education: Not on file  . Highest education level: Not on file  Occupational History  . Not on file  Social Needs  . Financial resource strain: Not on file  . Food insecurity:    Worry: Not on file    Inability: Not on file  . Transportation needs:    Medical: Not on file    Non-medical: Not on file  Tobacco Use  . Smoking status: Current Every Day Smoker    Packs/day: 1.00    Types: Cigarettes  . Smokeless tobacco: Never Used  Substance and Sexual Activity  . Alcohol use: Never    Frequency: Never  . Drug use: Never  . Sexual activity: Not on file  Lifestyle  . Physical activity:    Days per week: Not on file    Minutes per session: Not on file  . Stress: Not on file  Relationships  . Social connections:    Talks on phone: Not on file    Gets together: Not on file    Attends religious service: Not on file    Active member of club or organization: Not on file    Attends meetings of clubs or organizations: Not  on file    Relationship status: Not on file  Other Topics Concern  . Not on file  Social History Narrative    used to live [place]; moved  To [place]- end of April 2019; in Nursing home; 1pp/day; quit alcohol. 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     ECOG Status: 1 - Symptomatic but completely ambulatory  Review of Systems: A 12 point ROS discussed and pertinent positives are indicated in the HPI above.  All other systems are negative.  Review of Systems  Constitutional: Positive for activity change and fatigue. Negative for appetite change, chills, diaphoresis and fever.  HENT: Negative.   Respiratory: Negative.   Cardiovascular: Negative.   Gastrointestinal: Negative.   Genitourinary: Positive for hematuria. Negative for dysuria and flank pain.  Musculoskeletal: Negative.   Neurological: Negative.      Vital Signs: BP 132/89   Pulse 74   Temp 98.1 F (36.7 C) (Oral)   Resp 16   SpO2 97%   Physical Exam  Constitutional: He is oriented to person, place, and time. No distress.  HENT:  Head: Normocephalic and atraumatic.  Neck: Neck supple. No JVD present. No tracheal deviation present.  Cardiovascular: Normal rate, regular rhythm and normal heart sounds. Exam reveals no gallop and no friction rub.  No murmur heard. Pulmonary/Chest: Effort normal and breath sounds normal. No stridor. No respiratory distress. He has no rales.  Abdominal: Soft. Bowel sounds are normal. He exhibits no distension and no mass. There is no tenderness. There is no rebound and no guarding.  Musculoskeletal: He exhibits no edema.  Lymphadenopathy:    He has no cervical adenopathy.  Neurological: He is alert and oriented to person, place, and time.  Skin: Skin is warm and dry. He is not diaphoretic.  Vitals reviewed.   Imaging: Nm Pet Image Initial (pi) Skull Base To Thigh  Result Date: 05/31/2018 CLINICAL DATA:  Subsequent. Treatment strategy for urothelial carcinoma. EXAM: NUCLEAR MEDICINE PET SKULL BASE TO THIGH TECHNIQUE: 9.38 mCi F-18 FDG was injected intravenously. Full-ring PET imaging was performed from the skull base to thigh after the radiotracer. CT data was obtained and used for attenuation correction and anatomic localization. Fasting blood glucose: 75 mg/dl COMPARISON:  CT chest abdomen and pelvis from 05/07/2018 FINDINGS: Mediastinal blood pool activity: SUV max 2.16 NECK: No hypermetabolic lymph nodes in the neck. Incidental CT findings: none CHEST: Mildly hypermetabolic bilateral axillary lymph nodes are identified. The index right axillary lymph node measures 1 cm short axis and has an SUV max of 6.8. Left axillary lymph node measures 1.1 cm and has an SUV max of 4.09. No hypermetabolic supraclavicular, mediastinal or hilar lymph nodes. No pleural effusion identified.No hypermetabolic  pulmonary nodules or masses identified. Incidental CT findings: Calcification in the LAD, left main coronary arteries noted. ABDOMEN/PELVIS: There is no abnormal uptake identified within the liver, pancreas, or spleen. The adrenal glands are unremarkable. No hypermetabolic lymph nodes identified within the abdomen. Mildly hypermetabolic bilateral external iliac and bilateral inguinal lymph nodes are identified. - left pelvic sidewall lymph node measures 1.3 cm and has an SUV max of 3.30. - right external iliac lymph node is hypermetabolic measuring 1.2 cm within SUV max of 3.73. - index right inguinal lymph node measures 1.5 cm and has an SUV max of 3.28. -  left inguinal lymph node measures 1.7 cm within SUV max of 3.11. Incidental CT findings: Aortic atherosclerosis identified. No aneurysm. Right sided nephroureteral stent is in place. If hypermetabolic soft  tissue within the right renal pelvis and proximal right ureter is again noted compatible with urothelial lesion. SKELETON: No focal hypermetabolic activity to suggest skeletal metastasis. Incidental CT findings: none IMPRESSION: 1. Mild to moderate increased FDG uptake is associated with bilateral axillary, bilateral external iliac and bilateral inguinal lymph nodes. The most intensely hypermetabolic lymph node is in the right axillary region with an SUV max of 6.8. The hypermetabolic right axillary lymph node should be easily amendable to ultrasound-guided percutaneous tissue sampling. 2. Asymmetric increased soft tissue within the right renal pelvis and proximal right ureter compatible with urothelial neoplasm. Status post right double-J nephroureteral stent placement. 3.  Aortic Atherosclerosis (ICD10-I70.0). Electronically Signed   By: Kerby Moors M.D.   On: 05/31/2018 13:22    Labs:  CBC: Recent Labs    05/01/18 0547 05/26/18 1546 06/07/18 1049 06/08/18 1242  WBC 9.0 4.9 5.1 5.2  HGB 9.4* 9.1* 8.6* 9.1*  HCT 30.8* 29.8* 28.8* 30.3*  PLT  191 182 187 211    COAGS: Recent Labs    04/25/18 0744 04/25/18 1205 05/01/18 0547 06/08/18 1242  INR 2.29  --  1.30 1.11  APTT  --  42* 36  --     BMP: Recent Labs    04/30/18 0457 05/01/18 0547 05/26/18 1546 06/07/18 1049  NA 148* 147* 138 135  K 3.8 3.8 3.8 3.9  CL 114* 114* 109 106  CO2 28 27 22  21*  GLUCOSE 111* 120* 125* 116*  BUN 48* 44* 13 16  CALCIUM 7.6* 7.5* 8.6* 8.3*  CREATININE 1.16 1.17 0.95 1.02  GFRNONAA >60 >60 >60 >60  GFRAA >60 >60 >60 >60    LIVER FUNCTION TESTS: Recent Labs    04/25/18 0751 04/26/18 0540 05/26/18 1546 06/07/18 1049  BILITOT 0.9 0.8 0.4 0.5  AST 1,155* 509* 31 56*  ALT 283* 157* 16 34  ALKPHOS 96 69 113 97  PROT 7.5 5.5* 7.6 7.3  ALBUMIN 2.9* 2.3* 3.1* 3.2*    Assessment and Plan:  Most metabolically active LN is a right lateral axillary LN on PET. Will target this node for US guided biopsy today. Risks and benefits discussed with the patient including, but not limited to bleeding, infection, damage to adjacent structures or low yield requiring additional tests. All of the patient's questions were answered, patient is agreeable to proceed. Consent signed and in chart.  Thank you for this interesting consult.  I greatly enjoyed meeting Aldrich Lloyd and look forward to participating in their care.  A copy of this report was sent to the requesting provider on this date.  Electronically Signed: Azzie Roup, MD 06/08/2018, 1:53 PM   I spent a total of 30 Minutes in face to face in clinical consultation, greater than 50% of which was counseling/coordinating care for right axillary lymph node biopsy.

## 2018-06-11 ENCOUNTER — Encounter: Payer: Self-pay | Admitting: Urology

## 2018-06-11 LAB — SURGICAL PATHOLOGY

## 2018-06-14 ENCOUNTER — Inpatient Hospital Stay: Payer: Medicaid Other

## 2018-06-14 ENCOUNTER — Inpatient Hospital Stay (HOSPITAL_BASED_OUTPATIENT_CLINIC_OR_DEPARTMENT_OTHER): Payer: Medicaid Other | Admitting: Internal Medicine

## 2018-06-14 ENCOUNTER — Other Ambulatory Visit: Payer: Self-pay

## 2018-06-14 VITALS — BP 107/70 | HR 68 | Temp 98.6°F | Resp 20 | Ht 67.0 in | Wt 156.0 lb

## 2018-06-14 DIAGNOSIS — F418 Other specified anxiety disorders: Secondary | ICD-10-CM

## 2018-06-14 DIAGNOSIS — R319 Hematuria, unspecified: Secondary | ICD-10-CM | POA: Diagnosis not present

## 2018-06-14 DIAGNOSIS — I700 Atherosclerosis of aorta: Secondary | ICD-10-CM

## 2018-06-14 DIAGNOSIS — C661 Malignant neoplasm of right ureter: Secondary | ICD-10-CM | POA: Diagnosis not present

## 2018-06-14 DIAGNOSIS — B1920 Unspecified viral hepatitis C without hepatic coma: Secondary | ICD-10-CM

## 2018-06-14 DIAGNOSIS — R590 Localized enlarged lymph nodes: Secondary | ICD-10-CM

## 2018-06-14 DIAGNOSIS — D509 Iron deficiency anemia, unspecified: Secondary | ICD-10-CM | POA: Diagnosis not present

## 2018-06-14 DIAGNOSIS — R5383 Other fatigue: Secondary | ICD-10-CM

## 2018-06-14 DIAGNOSIS — D500 Iron deficiency anemia secondary to blood loss (chronic): Secondary | ICD-10-CM

## 2018-06-14 DIAGNOSIS — B182 Chronic viral hepatitis C: Secondary | ICD-10-CM

## 2018-06-14 DIAGNOSIS — Z79899 Other long term (current) drug therapy: Secondary | ICD-10-CM

## 2018-06-14 DIAGNOSIS — Z8673 Personal history of transient ischemic attack (TIA), and cerebral infarction without residual deficits: Secondary | ICD-10-CM

## 2018-06-14 DIAGNOSIS — M25562 Pain in left knee: Secondary | ICD-10-CM

## 2018-06-14 DIAGNOSIS — M25462 Effusion, left knee: Secondary | ICD-10-CM

## 2018-06-14 DIAGNOSIS — R5381 Other malaise: Secondary | ICD-10-CM

## 2018-06-14 DIAGNOSIS — F17210 Nicotine dependence, cigarettes, uncomplicated: Secondary | ICD-10-CM

## 2018-06-14 DIAGNOSIS — M549 Dorsalgia, unspecified: Secondary | ICD-10-CM

## 2018-06-14 DIAGNOSIS — Z5112 Encounter for antineoplastic immunotherapy: Secondary | ICD-10-CM | POA: Diagnosis not present

## 2018-06-14 LAB — IRON AND TIBC
IRON: 25 ug/dL — AB (ref 45–182)
SATURATION RATIOS: 6 % — AB (ref 17.9–39.5)
TIBC: 453 ug/dL — AB (ref 250–450)
UIBC: 428 ug/dL

## 2018-06-14 LAB — COMPREHENSIVE METABOLIC PANEL
ALK PHOS: 106 U/L (ref 38–126)
ALT: 45 U/L — AB (ref 0–44)
ANION GAP: 9 (ref 5–15)
AST: 65 U/L — ABNORMAL HIGH (ref 15–41)
Albumin: 3.5 g/dL (ref 3.5–5.0)
BUN: 15 mg/dL (ref 6–20)
CALCIUM: 8.8 mg/dL — AB (ref 8.9–10.3)
CO2: 22 mmol/L (ref 22–32)
CREATININE: 1.08 mg/dL (ref 0.61–1.24)
Chloride: 105 mmol/L (ref 98–111)
GFR calc non Af Amer: >60 mL/min (ref >60)
Glucose, Bld: 120 mg/dL — ABNORMAL HIGH (ref 70–99)
Potassium: 3.8 mmol/L (ref 3.5–5.1)
SODIUM: 136 mmol/L (ref 135–145)
Total Bilirubin: 0.5 mg/dL (ref 0.3–1.2)
Total Protein: 7.6 g/dL (ref 6.5–8.1)

## 2018-06-14 LAB — CBC WITH DIFFERENTIAL/PLATELET
Abs Immature Granulocytes: 0.01 10*3/uL (ref 0.00–0.07)
BASOS ABS: 0.1 10*3/uL (ref 0.0–0.1)
Basophils Relative: 1 %
EOS ABS: 0.4 10*3/uL (ref 0.0–0.5)
EOS PCT: 7 %
HEMATOCRIT: 31.1 % — AB (ref 39.0–52.0)
HEMOGLOBIN: 9.4 g/dL — AB (ref 13.0–17.0)
Immature Granulocytes: 0 %
LYMPHS ABS: 1 10*3/uL (ref 0.7–4.0)
LYMPHS PCT: 20 %
MCH: 21.8 pg — ABNORMAL LOW (ref 26.0–34.0)
MCHC: 30.2 g/dL (ref 30.0–36.0)
MCV: 72 fL — AB (ref 80.0–100.0)
Monocytes Absolute: 0.4 10*3/uL (ref 0.1–1.0)
Monocytes Relative: 7 %
NRBC: 0 % (ref 0.0–0.2)
Neutro Abs: 3.4 10*3/uL (ref 1.7–7.7)
Neutrophils Relative %: 65 %
Platelets: 208 10*3/uL (ref 150–400)
RBC: 4.32 MIL/uL (ref 4.22–5.81)
RDW: 26.1 % — AB (ref 11.5–15.5)
WBC: 5.3 10*3/uL (ref 4.0–10.5)

## 2018-06-14 LAB — FERRITIN: Ferritin: 49 ng/mL (ref 24–336)

## 2018-06-14 MED ORDER — IRON SUCROSE 20 MG/ML IV SOLN
200.0000 mg | Freq: Once | INTRAVENOUS | Status: AC
  Administered 2018-06-14: 200 mg via INTRAVENOUS
  Filled 2018-06-14: qty 10

## 2018-06-14 MED ORDER — SODIUM CHLORIDE 0.9 % IV SOLN
Freq: Once | INTRAVENOUS | Status: AC
  Administered 2018-06-14: 15:00:00 via INTRAVENOUS
  Filled 2018-06-14: qty 250

## 2018-06-14 NOTE — Assessment & Plan Note (Addendum)
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  Nov 2019- PET shows- right ureteral uptake; and also shows in axilla/abdomen/pelvic adenopathy- "suspicious for metastatic disease".   # Interestingly, right axillary lymph node biopsy-negative for malignancy [reactive]; will review the pathology imaging at the tumor conference on the 21st.  #Colon cancer stage III- T4N1-no adjuvant therapy.  MSI high as noted per pathology reports.  Again highly suspicious of Lynch syndrome given possible history of malignancy and family history/see discussion below.   #Discussed with the patient-the difficulty in the next step management as patient stated is not appropriately staged at this time-given the concerns of metastases based on PET scan/whereas biopsy of right axilla lymph node is negative.  Will discuss at the tumor conference/and also discussed with urology.   #1 option would be-proceeding with systemic therapy with immunotherapy-as patient is expected to have significant benefit from MSI high status.  Patient is not a candidate for any aggressive chemotherapy based upon his medical problems/performance status.  Inpatient with MSI high tumors-response rates are anywhere between 40 to 50%; complete response is around 20%; patients with continued response at 6 months would be around 70 to 80%.    I discussed the mechanism of action; The goal of therapy is palliative; and length of treatments are likely ongoing/based upon the results of the scans. Discussed the potential side effects of immunotherapy including but not limited to diarrhea; skin rash; elevated LFTs/endocrine abnormalities etc.   #We will obtain slides from [place] to be reviewed here.  # family History of cancer- dad- prostate ca [at 43y]; brother- 35 died of prostate cancer; brother- 68- no cancers [[place]]; sonGerald Stabs [[place]];[name]-32y prostate ca [facility] [place]]; daughter- 36 [NM]; another daughter  5 [NM/addict]. will refer genetics counseling. Given MSI- abnormal; highly suspicious of Lynch syndrome.  Will need genetic counseling.  # Iron deficiency anemia-hemoglobin 8.7-recent colon cancer/urologic loss-proceed with IV Venofer today; and weekly x4.  Improving.   # Hepatitis C- Positive; awaiting HCV RNA.   # Left knee pain status post recent steroid injection-chronic stable.  #I had a long discussion the patient's son [name] regarding the above plan; his father potentially having Lynch syndrome [would need confirmation testing]; also positive-rest of the family needs to be tested.  # DISPOSITION: # IV venofer today. # IV venofer in 1 week/MD-Dr.B

## 2018-06-14 NOTE — Progress Notes (Signed)
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       HISTORY OF PRESENTING ILLNESS: Patient is a poor historian.  Is alone. Chad [name] 57 y.o.  male above multiple medical problems is here to review the results of his right axillary lymph node biopsy.   In the interim patient had axillary lymph node biopsy which was positive on PET scan.  Patient continues to complain of his left knee pain left knee swelling.  Denies any blood in urine.   Review of Systems  Constitutional: Positive for malaise/fatigue. Negative for chills, diaphoresis, fever and weight loss.  HENT: Negative for nosebleeds and sore throat.   Eyes: Negative for double vision.  Respiratory: Negative for cough, hemoptysis, sputum production, shortness of breath and wheezing.   Cardiovascular: Negative for chest pain, palpitations, orthopnea and leg swelling.  Gastrointestinal: Negative for abdominal pain, blood in stool, constipation, diarrhea,  heartburn, melena, nausea and vomiting.  Genitourinary: Positive for hematuria. Negative for dysuria, frequency and urgency.  Musculoskeletal: Positive for back pain and joint pain.  Skin: Negative.  Negative for itching and rash.  Neurological: Positive for focal weakness. Negative for dizziness, tingling, weakness and headaches.       Chronic left-sided weakness upper than lower extremity.  Endo/Heme/Allergies: Does not bruise/bleed easily.  Psychiatric/Behavioral: Negative for depression. The patient is not nervous/anxious and does not have insomnia.      MEDICAL HISTORY:  Past Medical History:  Diagnosis Date  . Anxiety   . Cancer [facility])    prostate  . Depression   . Hyperlipidemia   . Nerve pain   . Stroke [facility])     SURGICAL HISTORY: Past Surgical History:  Procedure Laterality Date  . COLON SURGERY     En bloc extended right hemicolectomy 07/2017  . CYSTOSCOPY WITH STENT PLACEMENT Right 04/25/2018   Procedure: CYSTOSCOPY WITH STENT PLACEMENT;  Surgeon: Hollice Espy, MD;  Location: [facility] ORS;  Service: Urology;  Laterality: Right;  . tumor removed       SOCIAL HISTORY: Social History   Socioeconomic History  . Marital status: Single    Spouse name: Not on file  . Number of children: Not on file  . Years of education: Not on file  . Highest education level: Not on file  Occupational History  . Not on file  Social Needs  . Financial resource strain: Not on file  . Food insecurity:    Worry: Not on  file    Inability: Not on file  . Transportation needs:    Medical: Not on file    Non-medical: Not on file  Tobacco Use  . Smoking status: Current Every Day Smoker    Packs/day: 1.00    Types: Cigarettes  . Smokeless tobacco: Never Used  Substance and Sexual Activity  . Alcohol use: Never    Frequency: Never  . Drug use: Never  . Sexual activity: Not on file  Lifestyle  . Physical activity:    Days per week: Not on file    Minutes per session: Not on file   . Stress: Not on file  Relationships  . Social connections:    Talks on phone: Not on file    Gets together: Not on file    Attends religious service: Not on file    Active member of club or organization: Not on file    Attends meetings of clubs or organizations: Not on file    Relationship status: Not on file  . Intimate partner violence:    Fear of current or ex partner: Not on file    Emotionally abused: Not on file    Physically abused: Not on file    Forced sexual activity: Not on file  Other Topics Concern  . Not on file  Social History Narrative    used to live [place]; moved  To [place]- end of April 2019; in Nursing home; 1pp/day; quit alcohol. 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     FAMILY HISTORY: Family History  Problem Relation Age of Onset  . Prostate cancer Father     ALLERGIES:  is allergic to penicillins.  MEDICATIONS:  Current Outpatient Medications  Medication Sig Dispense Refill  . acetaminophen (TYLENOL) 325 MG tablet Take 2 tablets by mouth as needed.    Marland Kitchen atorvastatin (LIPITOR) 80 MG tablet Take 1 tablet (80 mg total) by mouth daily. 90 tablet 3  . escitalopram (LEXAPRO) 20 MG tablet Take 1 tablet (20 mg total) by mouth daily. 90 tablet 3  . gabapentin (NEURONTIN) 100 MG capsule Take one to 2 tabs for pain 3 x day (Patient taking differently: Take 100 mg by mouth 3 (three) times daily. ) 180 capsule 3  . pantoprazole (PROTONIX) 40 MG tablet Take 40 mg by mouth daily.    . QUEtiapine (SEROQUEL) 50 MG tablet Take 1 tablet (50 mg total) by mouth at bedtime. 90 tablet 3   No current facility-administered medications for this visit.       Marland Kitchen  PHYSICAL EXAMINATION: ECOG PERFORMANCE STATUS: 1 - Symptomatic but completely ambulatory  Vitals:   06/14/18 1331  BP: 107/70  Pulse: 68  Resp: 20  Temp: 98.6 F (37 C)   Filed Weights   06/14/18 1331  Weight: 156 lb (70.8 kg)    Physical Exam  Constitutional: He is oriented to person, place, and time.  In  wheelchair. Cachectic.  Appears older than his stated age.  Alone.  HENT:  Head: Normocephalic and atraumatic.  Mouth/Throat: Oropharynx is clear and moist. No oropharyngeal exudate.  Eyes: Pupils are equal, round, and reactive to light.  Neck: Normal range of motion. Neck supple.  Cardiovascular: Normal rate and regular rhythm.  Pulmonary/Chest: No respiratory distress. He has no wheezes.  Abdominal: Soft. Bowel sounds are normal. He exhibits no distension and no mass. There is no tenderness. There is no rebound and no guarding.  Musculoskeletal: Normal range of motion. He exhibits no edema or tenderness.  Neurological: He is alert and oriented to person, place, and time.  Weakness left upper and lower extremity.  Skin: Skin is warm.  Psychiatric: Affect normal.     LABORATORY DATA:  I have reviewed the data as listed Lab Results  Component Value Date   WBC 5.3 06/14/2018   HGB 9.4 (L) 06/14/2018   HCT 31.1 (L) 06/14/2018   MCV 72.0 (L) 06/14/2018   PLT 208 06/14/2018   Recent Labs    05/26/18 1546 06/07/18 1049 06/14/18 1253  NA 138 135 136  K 3.8 3.9 3.8  CL 109 106 105  CO2 22 21* 22  GLUCOSE 125* 116* 120*  BUN '13 16 15  ' CREATININE 0.95 1.02 1.08  CALCIUM 8.6* 8.3* 8.8*  GFRNONAA >60 >60 >60  GFRAA >60 >60 >60  PROT 7.6 7.3 7.6  ALBUMIN 3.1* 3.2* 3.5  AST 31 56* 65*  ALT 16 34 45*  ALKPHOS 113 97 106  BILITOT 0.4 0.5 0.5    RADIOGRAPHIC STUDIES: I have personally reviewed the radiological images as listed and agreed with the findings in the report. Nm Pet Image Initial (pi) Skull Base To Thigh  Result Date: 05/31/2018 CLINICAL DATA:  Subsequent. Treatment strategy for urothelial carcinoma. EXAM: NUCLEAR MEDICINE PET SKULL BASE TO THIGH TECHNIQUE: 9.38 mCi F-18 FDG was injected intravenously. Full-ring PET imaging was performed from the skull base to thigh after the radiotracer. CT data was obtained and used for attenuation correction and anatomic  localization. Fasting blood glucose: 75 mg/dl COMPARISON:  CT chest abdomen and pelvis from 05/07/2018 FINDINGS: Mediastinal blood pool activity: SUV max 2.16 NECK: No hypermetabolic lymph nodes in the neck. Incidental CT findings: none CHEST: Mildly hypermetabolic bilateral axillary lymph nodes are identified. The index right axillary lymph node measures 1 cm short axis and has an SUV max of 6.8. Left axillary lymph node measures 1.1 cm and has an SUV max of 4.09. No hypermetabolic supraclavicular, mediastinal or hilar lymph nodes. No pleural effusion identified.No hypermetabolic pulmonary nodules or masses identified. Incidental CT findings: Calcification in the LAD, left main coronary arteries noted. ABDOMEN/PELVIS: There is no abnormal uptake identified within the liver, pancreas, or spleen. The adrenal glands are unremarkable. No hypermetabolic lymph nodes identified within the abdomen. Mildly hypermetabolic bilateral external iliac and bilateral inguinal lymph nodes are identified. - left pelvic sidewall lymph node measures 1.3 cm and has an SUV max of 3.30. - right external iliac lymph node is hypermetabolic measuring 1.2 cm within SUV max of 3.73. - index right inguinal lymph node measures 1.5 cm and has an SUV max of 3.28. -  left inguinal lymph node measures 1.7 cm within SUV max of 3.11. Incidental CT findings: Aortic atherosclerosis identified. No aneurysm. Right sided nephroureteral stent is in place. If hypermetabolic soft tissue within the right renal pelvis and proximal right ureter is again noted compatible with urothelial lesion. SKELETON: No focal hypermetabolic activity to suggest skeletal metastasis. Incidental CT findings: none IMPRESSION: 1. Mild to moderate increased FDG uptake is associated with bilateral axillary, bilateral external iliac and bilateral inguinal lymph nodes. The most intensely hypermetabolic lymph node is in the right axillary region with an SUV max of 6.8. The  hypermetabolic right axillary lymph node should be easily amendable to ultrasound-guided percutaneous tissue sampling. 2. Asymmetric increased soft tissue within the right renal pelvis and proximal right ureter compatible with urothelial neoplasm. Status post right double-J nephroureteral stent placement. 3.  Aortic Atherosclerosis (ICD10-I70.0). Electronically Signed   By: Lovena Le  Clovis [name] M.D.   On: 05/31/2018 13:22   [place] Core Biopsy (lymph Nodes)  Result Date: 06/08/2018 CLINICAL DATA:  History of high-grade urothelial carcinoma of the right proximal ureter. PET scan has demonstrated hypermetabolic bilateral axillary, iliac and inguinal lymph nodes. The patient presents for biopsy of the largest right axillary lymph node that demonstrates increased metabolic activity by PET scan. EXAM: ULTRASOUND GUIDED CORE BIOPSY OF RIGHT AXILLARY LYMPH NODE MEDICATIONS: 2.0 mg IV Versed; 100 mcg IV Fentanyl Total Moderate Sedation Time: 14 minutes. The patient's level of consciousness and physiologic status were continuously monitored during the procedure by Radiology nursing. PROCEDURE: The procedure, risks, benefits, and alternatives were explained to the patient. Questions regarding the procedure were encouraged and answered. The patient understands and consents to the procedure. A time out was performed prior to initiating the procedure. The right axillary region was prepped with chlorhexidine in a sterile fashion, and a sterile drape was applied covering the operative field. A sterile gown and sterile gloves were used for the procedure. Local anesthesia was provided with 1% Lidocaine. A 16 gauge core needle biopsy device was utilized in obtaining 5 samples through a right axillary lymph node. Material was submitted on saline soaked Telfa. COMPLICATIONS: None. FINDINGS: The largest lymph node of the right axilla located in the lateral axilla corresponds to the metabolically active lymph node by PET. This node measures  approximately 3.5 x 1.3 x 0.9 cm. Solid tissue was obtained. IMPRESSION: Ultrasound-guided core biopsy performed of an enlarged right axillary lymph node. Electronically Signed   By: Aletta Edouard M.D.   On: 06/08/2018 15:38    ASSESSMENT & PLAN:   Ureteral cancer, right ([place]) # High-grade urothelial cancer/cytology; likely of the right renal pelvis /upper ureter.  Nov 2019- PET shows- right ureteral uptake; and also shows in axilla/abdomen/pelvic adenopathy- "suspicious for metastatic disease".   # Interestingly, right axillary lymph node biopsy-negative for malignancy [reactive]; will review the pathology imaging at the tumor conference on the 21st.  #Colon cancer stage III- T4N1-no adjuvant therapy.  MSI high as noted per pathology reports.  Again highly suspicious of Lynch syndrome given possible history of malignancy and family history/see discussion below.   #Discussed with the patient-the difficulty in the next step management as patient stated is not appropriately staged at this time-given the concerns of metastases based on PET scan/whereas biopsy of right axilla lymph node is negative.  Will discuss at the tumor conference/and also discussed with urology.   #1 option would be-proceeding with systemic therapy with immunotherapy-as patient is expected to have significant benefit from MSI high status.  Patient is not a candidate for any aggressive chemotherapy based upon his medical problems/performance status.  Inpatient with MSI high tumors-response rates are anywhere between 40 to 50%; complete response is around 20%; patients with continued response at 6 months would be around 70 to 80%.    I discussed the mechanism of action; The goal of therapy is palliative; and length of treatments are likely ongoing/based upon the results of the scans. Discussed the potential side effects of immunotherapy including but not limited to diarrhea; skin rash; elevated LFTs/endocrine abnormalities  etc.   #We will obtain slides from [place] to be reviewed here.  # family History of cancer- dad- prostate ca [at 40y]; brother- 50 died of prostate cancer; brother- 53- no cancers [[place]]; sonGerald Stabs [[place]];[name]-32y prostate ca [facility] [place]]; daughter- 1 [[place]]; another daughter 73 [NM/addict]. will refer genetics counseling. Given MSI- abnormal; highly suspicious of  Lynch syndrome.  Will need genetic counseling.  # Iron deficiency anemia-hemoglobin 8.7-recent colon cancer/urologic loss-proceed with IV Venofer today; and weekly x4.  Improving.   # Hepatitis C- Positive; awaiting HCV RNA.   # Left knee pain status post recent steroid injection-chronic stable.  #I had a long discussion the patient's son [name] regarding the above plan; his father potentially having Lynch syndrome [would need confirmation testing]; also positive-rest of the family needs to be tested.  # DISPOSITION: # IV venofer today. # IV venofer in 1 week/MD-Dr.B  All questions were answered. The patient knows to call the clinic with any problems, questions or concerns.    Cammie Sickle, MD 06/15/2018 8:21 AM

## 2018-06-15 ENCOUNTER — Telehealth: Payer: Self-pay | Admitting: Internal Medicine

## 2018-06-15 DIAGNOSIS — C661 Malignant neoplasm of right ureter: Secondary | ICD-10-CM

## 2018-06-15 LAB — CEA: CEA: 4.1 ng/mL (ref 0.0–4.7)

## 2018-06-15 NOTE — Telephone Encounter (Signed)
Chad Dun- please request colon cancer pathology slides [jan 2019] from [place] to be reviewed here.  Thanks, GB

## 2018-06-15 NOTE — Telephone Encounter (Signed)
Rosa, this patient will likely need chemocare apts with NP. Pt is high risk with social barriers to care.

## 2018-06-15 NOTE — Telephone Encounter (Signed)
Yes, please keep scheduled venofer and add keytruda (NEW).  Also needs chemo education. Pt lives at [facility], so caregiver may need to be contacted to come to this chemo class apt.

## 2018-06-15 NOTE — Progress Notes (Signed)
START ON PATHWAY REGIMEN - Bladder     A cycle is 21 days:     Pembrolizumab   **Always confirm dose/schedule in your pharmacy ordering system**  Patient Characteristics: Metastatic Disease, First Line, No Prior Neoadjuvant/Adjuvant Therapy, Poor Renal Function (CrCl < 50 mL/min), Unknown PD-L1 Expression AJCC M Category: M1 AJCC N Category: N1 AJCC T Category: pT2b Current evidence of distant metastases<= Yes AJCC 8 Stage Grouping: Unknown Line of Therapy: First Line Prior Neoadjuvant/Adjuvant Therapy<= No Renal Function: Poor Renal Function (CrCl < 50 mL/min) PD-L1 Expression Status: Unknown PD-L1 Expression Intent of Therapy: Non-Curative / Palliative Intent, Discussed with Patient

## 2018-06-15 NOTE — Telephone Encounter (Signed)
We will tentatively plan to start patient on Keytruda every 3 weeks-starting 11/25.  Please order CMP TSH at next visit.  Please schedule chemotherapy education.

## 2018-06-15 NOTE — Patient Instructions (Signed)
Pembrolizumab injection  What is this medicine?  PEMBROLIZUMAB (pem broe liz ue mab) is a monoclonal antibody. It is used to treat melanoma, head and neck cancer, Hodgkin lymphoma, non-small cell lung cancer, urothelial cancer, stomach cancer, and cancers that have a certain genetic condition.  This medicine may be used for other purposes; ask your health care provider or pharmacist if you have questions.  COMMON BRAND NAME(S): Keytruda  What should I tell my health care provider before I take this medicine?  They need to know if you have any of these conditions:  -diabetes  -immune system problems  -inflammatory bowel disease  -liver disease  -lung or breathing disease  -lupus  -organ transplant  -an unusual or allergic reaction to pembrolizumab, other medicines, foods, dyes, or preservatives  -pregnant or trying to get pregnant  -breast-feeding  How should I use this medicine?  This medicine is for infusion into a vein. It is given by a health care professional in a hospital or clinic setting.  A special MedGuide will be given to you before each treatment. Be sure to read this information carefully each time.  Talk to your pediatrician regarding the use of this medicine in children. While this drug may be prescribed for selected conditions, precautions do apply.  Overdosage: If you think you have taken too much of this medicine contact a poison control center or emergency room at once.  NOTE: This medicine is only for you. Do not share this medicine with others.  What if I miss a dose?  It is important not to miss your dose. Call your doctor or health care professional if you are unable to keep an appointment.  What may interact with this medicine?  Interactions have not been studied.  Give your health care provider a list of all the medicines, herbs, non-prescription drugs, or dietary supplements you use. Also tell them if you smoke, drink alcohol, or use illegal drugs. Some items may interact with your  medicine.  This list may not describe all possible interactions. Give your health care provider a list of all the medicines, herbs, non-prescription drugs, or dietary supplements you use. Also tell them if you smoke, drink alcohol, or use illegal drugs. Some items may interact with your medicine.  What should I watch for while using this medicine?  Your condition will be monitored carefully while you are receiving this medicine.  You may need blood work done while you are taking this medicine.  Do not become pregnant while taking this medicine or for 4 months after stopping it. Women should inform their doctor if they wish to become pregnant or think they might be pregnant. There is a potential for serious side effects to an unborn child. Talk to your health care professional or pharmacist for more information. Do not breast-feed an infant while taking this medicine or for 4 months after the last dose.  What side effects may I notice from receiving this medicine?  Side effects that you should report to your doctor or health care professional as soon as possible:  -allergic reactions like skin rash, itching or hives, swelling of the face, lips, or tongue  -bloody or black, tarry  -breathing problems  -changes in vision  -chest pain  -chills  -constipation  -cough  -dizziness or feeling faint or lightheaded  -fast or irregular heartbeat  -fever  -flushing  -hair loss  -low blood counts - this medicine may decrease the number of white blood cells, red blood cells   and platelets. You may be at increased risk for infections and bleeding.  -muscle pain  -muscle weakness  -persistent headache  -signs and symptoms of high blood sugar such as dizziness; dry mouth; dry skin; fruity breath; nausea; stomach pain; increased hunger or thirst; increased urination  -signs and symptoms of kidney injury like trouble passing urine or change in the amount of urine  -signs and symptoms of liver injury like dark urine, light-colored  stools, loss of appetite, nausea, right upper belly pain, yellowing of the eyes or skin  -stomach pain  -sweating  -weight loss  Side effects that usually do not require medical attention (report to your doctor or health care professional if they continue or are bothersome):  -decreased appetite  -diarrhea  -tiredness  This list may not describe all possible side effects. Call your doctor for medical advice about side effects. You may report side effects to FDA at 1-800-FDA-1088.  Where should I keep my medicine?  This drug is given in a hospital or clinic and will not be stored at home.  NOTE: This sheet is a summary. It may not cover all possible information. If you have questions about this medicine, talk to your doctor, pharmacist, or health care provider.   2018 Elsevier/Gold Standard (2016-04-22 12:29:36)

## 2018-06-15 NOTE — Addendum Note (Signed)
Addended by: Sabino Gasser on: 06/15/2018 09:31 AM   Modules accepted: Orders

## 2018-06-15 NOTE — Progress Notes (Signed)
Slide request sent to [facility] pathology with confirmation of receipt. Oncology Nurse Navigator Documentation  Navigator Location: [facility] (06/15/18 1500)   )Navigator Encounter Type: Letter/Fax/Email (06/15/18 1500)                                                    Time Spent with Patient: 60 (06/15/18 1500)

## 2018-06-16 LAB — HCV RNA QUANT RFLX ULTRA OR GENOTYP
HCV RNA Qnt(log copy/mL): 5.723 log10 IU/mL
HepC Qn: 529000 IU/mL

## 2018-06-16 LAB — HEPATITIS C GENOTYPE

## 2018-06-17 ENCOUNTER — Encounter: Payer: Self-pay | Admitting: Hospice and Palliative Medicine

## 2018-06-17 ENCOUNTER — Inpatient Hospital Stay: Payer: Medicaid Other

## 2018-06-17 ENCOUNTER — Other Ambulatory Visit: Payer: Self-pay

## 2018-06-17 ENCOUNTER — Inpatient Hospital Stay (HOSPITAL_BASED_OUTPATIENT_CLINIC_OR_DEPARTMENT_OTHER): Payer: Medicaid Other | Admitting: Hospice and Palliative Medicine

## 2018-06-17 VITALS — BP 103/68 | HR 76 | Temp 97.9°F | Resp 18 | Ht 67.0 in | Wt 156.0 lb

## 2018-06-17 DIAGNOSIS — I69854 Hemiplegia and hemiparesis following other cerebrovascular disease affecting left non-dominant side: Secondary | ICD-10-CM

## 2018-06-17 DIAGNOSIS — R1310 Dysphagia, unspecified: Secondary | ICD-10-CM

## 2018-06-17 DIAGNOSIS — C189 Malignant neoplasm of colon, unspecified: Secondary | ICD-10-CM | POA: Diagnosis not present

## 2018-06-17 DIAGNOSIS — Z515 Encounter for palliative care: Secondary | ICD-10-CM | POA: Diagnosis not present

## 2018-06-17 DIAGNOSIS — C661 Malignant neoplasm of right ureter: Secondary | ICD-10-CM | POA: Diagnosis not present

## 2018-06-17 DIAGNOSIS — R319 Hematuria, unspecified: Secondary | ICD-10-CM

## 2018-06-17 DIAGNOSIS — C182 Malignant neoplasm of ascending colon: Secondary | ICD-10-CM

## 2018-06-17 DIAGNOSIS — Z79899 Other long term (current) drug therapy: Secondary | ICD-10-CM

## 2018-06-17 DIAGNOSIS — R600 Localized edema: Secondary | ICD-10-CM

## 2018-06-17 DIAGNOSIS — Z5112 Encounter for antineoplastic immunotherapy: Secondary | ICD-10-CM | POA: Diagnosis not present

## 2018-06-17 DIAGNOSIS — F17210 Nicotine dependence, cigarettes, uncomplicated: Secondary | ICD-10-CM

## 2018-06-17 DIAGNOSIS — E46 Unspecified protein-calorie malnutrition: Secondary | ICD-10-CM

## 2018-06-17 LAB — CBC WITH DIFFERENTIAL/PLATELET
ABS IMMATURE GRANULOCYTES: 0.01 10*3/uL (ref 0.00–0.07)
BASOS ABS: 0.1 10*3/uL (ref 0.0–0.1)
Basophils Relative: 1 %
Eosinophils Absolute: 0.4 10*3/uL (ref 0.0–0.5)
Eosinophils Relative: 7 %
HEMATOCRIT: 30.4 % — AB (ref 39.0–52.0)
HEMOGLOBIN: 9 g/dL — AB (ref 13.0–17.0)
IMMATURE GRANULOCYTES: 0 %
LYMPHS ABS: 0.9 10*3/uL (ref 0.7–4.0)
LYMPHS PCT: 17 %
MCH: 21.7 pg — ABNORMAL LOW (ref 26.0–34.0)
MCHC: 29.6 g/dL — AB (ref 30.0–36.0)
MCV: 73.4 fL — ABNORMAL LOW (ref 80.0–100.0)
Monocytes Absolute: 0.4 10*3/uL (ref 0.1–1.0)
Monocytes Relative: 7 %
NEUTROS ABS: 3.7 10*3/uL (ref 1.7–7.7)
NEUTROS PCT: 68 %
NRBC: 0 % (ref 0.0–0.2)
Platelets: 189 10*3/uL (ref 150–400)
RBC: 4.14 MIL/uL — AB (ref 4.22–5.81)
RDW: 25.8 % — ABNORMAL HIGH (ref 11.5–15.5)
WBC: 5.5 10*3/uL (ref 4.0–10.5)

## 2018-06-17 LAB — URINALYSIS, COMPLETE (UACMP) WITH MICROSCOPIC
BACTERIA UA: NONE SEEN
RBC / HPF: >50 RBC/hpf — ABNORMAL HIGH (ref 0–5)
SPECIFIC GRAVITY, URINE: 1.022 (ref 1.005–1.030)
Squamous Epithelial / LPF: NONE SEEN (ref 0–5)
WBC, UA: >50 WBC/hpf — ABNORMAL HIGH (ref 0–5)

## 2018-06-17 LAB — PROTIME-INR
INR: 1.01
PROTHROMBIN TIME: 13.2 s (ref 11.4–15.2)

## 2018-06-17 LAB — APTT: aPTT: 30 seconds (ref 24–36)

## 2018-06-17 NOTE — Progress Notes (Signed)
Colon slides received. Pathology consult ordered and slides delivered to pathology.

## 2018-06-17 NOTE — Progress Notes (Signed)
Tumor Board Documentation  Rameses Ou was presented by Dr [name] at our Tumor Board on 06/17/2018, which included representatives from medical oncology, radiation oncology, surgical oncology, surgical, radiology, pathology, navigation, palliative care, research, genetics, pharmacy, pulmonology.  [place] currently presents as a new patient with history of the following treatments:  .  Additionally, we reviewed previous medical and familial history, history of present illness, and recent lab results along with all available histopathologic and imaging studies. The tumor board considered available treatment options and made the following recommendations: Adjuvant chemotherapy Radiology would like to have old scans from [place]  The following procedures/referrals were also placed: No orders of the defined types were placed in this encounter.   Clinical Trial Status: not discussed   Staging used: AJCC Stage Group  National site-specific guidelines NCCN were discussed with respect to the case.  Tumor board is a meeting of clinicians from various specialty areas who evaluate and discuss patients for whom a multidisciplinary approach is being considered. Final determinations in the plan of care are those of the provider(s). The responsibility for follow up of recommendations given during tumor board is that of the provider.   Today's extended care, comprehensive team conference, [place] was not present for the discussion and was not examined.   Multidisciplinary Tumor Board is a multidisciplinary case peer review process.  Decisions discussed in the Multidisciplinary Tumor Board reflect the opinions of the specialists present at the conference without having examined the patient.  Ultimately, treatment and diagnostic decisions rest with the primary provider(s) and the patient.

## 2018-06-17 NOTE — Progress Notes (Signed)
Chad [name]  [phone] Fax:([phone]  Patient Care Team: [name], MD as PCP - General (Internal Medicine)   Name of the patient: Chad [name]  [id number]  [birth year]   Date of Visit: 06/17/18  Diagnosis: stage IV ureteral cancer, stage III colon cancer  Current Treatment: Pembrolizumab  Reason for Visit: This patient is a 57 y.o. male who presents to chemo care clinic today for initial meeting in preparation for starting chemotherapy. I introduced the chemo care clinic and we discussed that the role of the clinic is to assist those who are at an increased risk of emergency room visits and/or complications during the course of chemotherapy treatment. We discussed that the increased risk takes into account factors such as age, performance status, and co-morbidities. We also discussed that for some, this might include barriers to care such as not having a primary care provider, lack of insurance/transportation, or not being able to afford medications. We discussed that the goal of the program is to help prevent unplanned ER visits and help reduce complications during chemotherapy. We do this by discussing specific risk factors to each individual and identifying ways that we can help improve these risk factors and reduce barriers to care.   Hematology/Oncology History:  Oncology History   # SEP-OCT 2019-right renal pelvis/ ureteral [cytology positive HIGH grade urothelial carcinoma [Dr.Brandon]  s/p stent placement  #Right ureteral obstruction status post stent placement  # JAN 2019- Right Colon ca [ T4N1]  [[facility]]; NO adjuvant therapy  # Hep C/ # stroke of left side/weakness-2018 Nov [[place]]; active smoker  DIAGNOSIS: # 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; # Colon ca- stage III  GOALS: palliative  CURRENT/MOST RECENT THERAPY [ ]       Urothelial cancer (HCC)    Initial Diagnosis    Urothelial cancer ([place])       Ureteral cancer, right ([place])   05/26/2018 Initial Diagnosis    Ureteral cancer, right ([place])    06/20/2018 -  Chemotherapy    The patient had pembrolizumab (KEYTRUDA) 200 mg in sodium chloride 0.9 % 50 mL chemo infusion, 200 mg, Intravenous, Once, 0 of 4 cycles  for chemotherapy treatment.       Allergies  Allergen Reactions  . Penicillins Rash     Past Medical History:  Diagnosis Date  . Anxiety   . Cancer [facility])    prostate  . Depression   . Hyperlipidemia   . Nerve pain   . Stroke [facility])      Past Surgical History:  Procedure Laterality Date  . COLON SURGERY     En bloc extended right hemicolectomy 07/2017  . CYSTOSCOPY WITH STENT PLACEMENT Right 04/25/2018   Procedure: CYSTOSCOPY WITH STENT PLACEMENT;  Surgeon: Hollice Espy, MD;  Location: [facility] ORS;  Service: Urology;  Laterality: Right;  . tumor removed       Social History   Socioeconomic History  . Marital status: Single    Spouse name: Not on file  . Number of children: Not on file  . Years of education: Not on file  . Highest education level: Not on file  Occupational History  . Not on file  Social Needs  . Financial resource strain: Not on file  . Food insecurity:    Worry: Not on file    Inability: Not on file  . Transportation needs:    Medical: Not on file    Non-medical: Not on file  Tobacco Use  . Smoking status: Current Every Day Smoker    Packs/day: 1.00    Types: Cigarettes  . Smokeless tobacco: Never Used  Substance and Sexual Activity  . Alcohol use: Never    Frequency: Never  . Drug use: Never  . Sexual activity: Not on file  Lifestyle  . Physical activity:    Days per week: Not on file    Minutes per session: Not on file  . Stress: Not on file  Relationships  . Social connections:    Talks on phone: Not on file    Gets together: Not on file    Attends religious service: Not on file    Active member of club or organization: Not on file    Attends meetings of clubs or  organizations: Not on file    Relationship status: Not on file  . Intimate partner violence:    Fear of current or ex partner: Not on file    Emotionally abused: Not on file    Physically abused: Not on file    Forced sexual activity: Not on file  Other Topics Concern  . Not on file  Social History Narrative    used to live [place]; moved  To [place]- end of April 2019; in Nursing home; 1pp/day; quit alcohol. 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     Family History  Problem Relation Age of Onset  . Prostate cancer Father     Current Outpatient Medications  Medication Sig Dispense Refill  . acetaminophen (TYLENOL) 325 MG tablet Take 2 tablets by mouth as needed.    Marland Kitchen atorvastatin (LIPITOR) 80 MG tablet Take 1 tablet (80 mg total) by mouth daily. 90 tablet 3  . escitalopram (LEXAPRO) 20 MG tablet Take 1 tablet (20 mg total) by mouth daily. 90 tablet 3  . gabapentin (NEURONTIN) 100 MG capsule Take one to 2 tabs for pain 3 x day (Patient taking differently: Take 100 mg by mouth 3 (three) times daily. ) 180 capsule 3  . pantoprazole (PROTONIX) 40 MG tablet Take 40 mg by mouth daily.    . QUEtiapine (SEROQUEL) 50 MG tablet Take 1 tablet (50 mg total) by mouth at bedtime. 90 tablet 3   No current facility-administered medications for this visit.      PERFORMANCE STATUS (ECOG) : 3 - Symptomatic, >50% confined to bed  Review of Systems As noted above. Otherwise, a complete review of systems is negative.  Physical Exam General: NAD, frail appearing, thin Pulmonary: unlabored Abdomen: soft, nontender, + bowel sounds GU: no suprapubic tenderness Extremities: trace edema, L. Knee in brace Skin: no rashes Neurological: L sided Weakness, speech clear, A&O   Assessment and Plan:    1. 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 High Risk for ER/Hospitalization during Chemotherapy: We discussed the role of the chemo care clinic and identified patient  specific risk factors. I discussed that patient was identified as high risk primarily based on: multiple co morbidities and poor social support. We also discussed the role of the Symptom Management and Palliative Care Clinics at [facility] and methods of contacting clinic/provider. He denies needing specific assistance at this time.   3. Social Determinants of Health:   Housing -patient currently a resident of SNF.  However, he plans to move in with his son following rehab.  Food -no current food concerns as he is a resident of SNF.  However, we reviewed resources at  the cancer center including food bank.  Transportation -he currently has transportation to and from the SNF.  However, he will need transportation to the cancer center once he moves and with his son following rehab.  We reviewed the transportation resources available in the cancer center.  Utilities -we discussed resources available for financial assistance if needed  Safety -patient says he feels safe in his environment  Financial Strain -patient denies current financial strain.  He has Medicaid out-of-state.  Will need Medicaid in [place].  Unclear if he has applied for this  Family/Community Support -patient has a son [place], with whom he plans to move in following rehab.  He four other children in [place] in [place].  He is twice divorced.  Physical Activity -patient has significant deconditioning and poor performance status at baseline.  He is currently at rehab.  4. Co-morbidities Complicating Care:  Patient has multiple significant co-morbidities including severe-protein calorie malnutrition, dysphagia, h/o stroke. He saw Dr. Humphrey Rolls to establish as a PCP but does not have a f/u appointment. Would benefit from f/u appointment after he leaves rehab.    Patient reports hematuria over the past 24 hours.  Will check CBC, UA, and coags.  Case discussed with Dr. [name].  Patient has follow-up appointment to see both  of [place] on Monday.  Patient is also being presented today to the tumor conference.   Patient expressed understanding and was in agreement with this plan. He also understands that He can call clinic at any time with any questions, concerns, or complaints.   A total of (25) minutes of face-to-face time was spent with this patient with greater than 50% of that time in counseling and care-coordination.   Signed by: Altha Harm, PhD, DNP, NP-C, [facility] [phone] (Work Cell)

## 2018-06-18 ENCOUNTER — Other Ambulatory Visit: Payer: Self-pay | Admitting: *Deleted

## 2018-06-18 DIAGNOSIS — D500 Iron deficiency anemia secondary to blood loss (chronic): Secondary | ICD-10-CM

## 2018-06-18 LAB — URINE CULTURE: Culture: <10000 — AB

## 2018-06-21 ENCOUNTER — Inpatient Hospital Stay: Payer: Medicaid Other

## 2018-06-21 ENCOUNTER — Inpatient Hospital Stay (HOSPITAL_BASED_OUTPATIENT_CLINIC_OR_DEPARTMENT_OTHER): Payer: Medicaid Other | Admitting: Hospice and Palliative Medicine

## 2018-06-21 ENCOUNTER — Inpatient Hospital Stay (HOSPITAL_BASED_OUTPATIENT_CLINIC_OR_DEPARTMENT_OTHER): Payer: Medicaid Other | Admitting: Internal Medicine

## 2018-06-21 ENCOUNTER — Other Ambulatory Visit: Payer: Self-pay

## 2018-06-21 VITALS — BP 120/71 | HR 83 | Temp 97.9°F | Resp 20 | Ht 67.0 in | Wt 159.9 lb

## 2018-06-21 DIAGNOSIS — C189 Malignant neoplasm of colon, unspecified: Secondary | ICD-10-CM

## 2018-06-21 DIAGNOSIS — F418 Other specified anxiety disorders: Secondary | ICD-10-CM

## 2018-06-21 DIAGNOSIS — D509 Iron deficiency anemia, unspecified: Secondary | ICD-10-CM

## 2018-06-21 DIAGNOSIS — Z515 Encounter for palliative care: Secondary | ICD-10-CM | POA: Diagnosis not present

## 2018-06-21 DIAGNOSIS — R319 Hematuria, unspecified: Secondary | ICD-10-CM

## 2018-06-21 DIAGNOSIS — F17210 Nicotine dependence, cigarettes, uncomplicated: Secondary | ICD-10-CM

## 2018-06-21 DIAGNOSIS — R5381 Other malaise: Secondary | ICD-10-CM

## 2018-06-21 DIAGNOSIS — Z79899 Other long term (current) drug therapy: Secondary | ICD-10-CM

## 2018-06-21 DIAGNOSIS — Z5112 Encounter for antineoplastic immunotherapy: Secondary | ICD-10-CM | POA: Diagnosis not present

## 2018-06-21 DIAGNOSIS — R531 Weakness: Secondary | ICD-10-CM

## 2018-06-21 DIAGNOSIS — D500 Iron deficiency anemia secondary to blood loss (chronic): Secondary | ICD-10-CM

## 2018-06-21 DIAGNOSIS — R5383 Other fatigue: Secondary | ICD-10-CM

## 2018-06-21 DIAGNOSIS — C661 Malignant neoplasm of right ureter: Secondary | ICD-10-CM

## 2018-06-21 DIAGNOSIS — B1920 Unspecified viral hepatitis C without hepatic coma: Secondary | ICD-10-CM

## 2018-06-21 DIAGNOSIS — E785 Hyperlipidemia, unspecified: Secondary | ICD-10-CM

## 2018-06-21 DIAGNOSIS — Z7189 Other specified counseling: Secondary | ICD-10-CM

## 2018-06-21 DIAGNOSIS — R05 Cough: Secondary | ICD-10-CM

## 2018-06-21 DIAGNOSIS — R0602 Shortness of breath: Secondary | ICD-10-CM

## 2018-06-21 DIAGNOSIS — Z9049 Acquired absence of other specified parts of digestive tract: Secondary | ICD-10-CM

## 2018-06-21 DIAGNOSIS — I69854 Hemiplegia and hemiparesis following other cerebrovascular disease affecting left non-dominant side: Secondary | ICD-10-CM

## 2018-06-21 LAB — COMPREHENSIVE METABOLIC PANEL
ALBUMIN: 3.6 g/dL (ref 3.5–5.0)
ALK PHOS: 101 U/L (ref 38–126)
ALT: 53 U/L — AB (ref 0–44)
AST: 79 U/L — AB (ref 15–41)
Anion gap: 8 (ref 5–15)
BILIRUBIN TOTAL: 0.7 mg/dL (ref 0.3–1.2)
BUN: 16 mg/dL (ref 6–20)
CALCIUM: 8.7 mg/dL — AB (ref 8.9–10.3)
CO2: 23 mmol/L (ref 22–32)
Chloride: 106 mmol/L (ref 98–111)
Creatinine, Ser: 1.2 mg/dL (ref 0.61–1.24)
GFR calc Af Amer: >60 mL/min (ref >60)
GFR calc non Af Amer: >60 mL/min (ref >60)
GLUCOSE: 94 mg/dL (ref 70–99)
Potassium: 3.8 mmol/L (ref 3.5–5.1)
Sodium: 137 mmol/L (ref 135–145)
TOTAL PROTEIN: 7.4 g/dL (ref 6.5–8.1)

## 2018-06-21 LAB — CBC WITH DIFFERENTIAL/PLATELET
ABS IMMATURE GRANULOCYTES: 0.01 10*3/uL (ref 0.00–0.07)
BASOS ABS: 0 10*3/uL (ref 0.0–0.1)
Basophils Relative: 1 %
EOS PCT: 7 %
Eosinophils Absolute: 0.3 10*3/uL (ref 0.0–0.5)
HEMATOCRIT: 33.8 % — AB (ref 39.0–52.0)
Hemoglobin: 10.1 g/dL — ABNORMAL LOW (ref 13.0–17.0)
IMMATURE GRANULOCYTES: 0 %
Lymphocytes Relative: 18 %
Lymphs Abs: 0.8 10*3/uL (ref 0.7–4.0)
MCH: 22.3 pg — ABNORMAL LOW (ref 26.0–34.0)
MCHC: 29.9 g/dL — ABNORMAL LOW (ref 30.0–36.0)
MCV: 74.6 fL — ABNORMAL LOW (ref 80.0–100.0)
Monocytes Absolute: 0.3 10*3/uL (ref 0.1–1.0)
Monocytes Relative: 7 %
NEUTROS PCT: 67 %
NRBC: 0 % (ref 0.0–0.2)
Neutro Abs: 3.1 10*3/uL (ref 1.7–7.7)
Platelets: 175 10*3/uL (ref 150–400)
RBC: 4.53 MIL/uL (ref 4.22–5.81)
RDW: 25.8 % — ABNORMAL HIGH (ref 11.5–15.5)
WBC: 4.5 10*3/uL (ref 4.0–10.5)

## 2018-06-21 LAB — SAMPLE TO BLOOD BANK

## 2018-06-21 LAB — SLIDE CONSULT, PATHOLOGY ARMC

## 2018-06-21 LAB — TSH: TSH: 1.5 u[IU]/mL (ref 0.350–4.500)

## 2018-06-21 MED ORDER — SODIUM CHLORIDE 0.9 % IV SOLN
Freq: Once | INTRAVENOUS | Status: AC
  Administered 2018-06-21: 12:00:00 via INTRAVENOUS
  Filled 2018-06-21: qty 250

## 2018-06-21 MED ORDER — SODIUM CHLORIDE 0.9 % IV SOLN
200.0000 mg | Freq: Once | INTRAVENOUS | Status: AC
  Administered 2018-06-21: 200 mg via INTRAVENOUS
  Filled 2018-06-21: qty 8

## 2018-06-21 MED ORDER — IRON SUCROSE 20 MG/ML IV SOLN
200.0000 mg | Freq: Once | INTRAVENOUS | Status: AC
  Administered 2018-06-21: 200 mg via INTRAVENOUS
  Filled 2018-06-21: qty 10

## 2018-06-21 NOTE — Assessment & Plan Note (Addendum)
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  Nov 2019- PET shows- right ureteral uptake; and also shows in axilla/abdomen/pelvic adenopathy- "suspicious for metastatic disease".  However right axillary lymph node biopsy negative for malignancy.  #After extensive discussion of the tumor conference-as patient is currently a poor candidate for immediate ureteronephrectomy; given the presence of abnormal lymph nodes on the CT scans- " presumed" metastatic disease.  Proceed with immunotherapy given MSI high status.  #Again reviewed the potential side effects of immunotherapy including but not limited to diarrhea pneumonitis skin rash etc.  #Colon cancer stage ? III [T4N1] stage IV-difficult to assess/difficult to access intra-abdominal/pelvic lymph nodes for biopsy.  #We will plan to obtain the CT scan images from [place] for review with the current scans to assess for lymphadenopathy as discussed at the tumor conference.   # family History of cancer- dad- prostate ca [at 32y]; brother- 14 died of prostate cancer; brother- 80- no cancers [[place]]; sonGerald Stabs [[place]];[name]-32y prostate ca [facility] [place]]; daughter- 47 [[place]]; another daughter 45 [[place]/addict]. will refer genetics counseling. Given MSI- abnormal; highly suspicious of Lynch syndrome.  Refer to genetic counseling.  Patient's son [name] aware of high possible [place] syndrome.  # Iron deficiency anemia-hemoglobin 8.7-recent colon cancer/urologic loss-proceed with IV Venofer today; and weekly x4.  Continue IV iron.  # Hepatitis C- Positive/HCV RNA positive.  Will follow-up with GI  # Left knee pain status post recent steroid injection-stable.  # DISPOSITION: # IV venofer/Keytruda today. # genetics referral Dx: ? Lynch syndrome # Follow up in 3 weeks/MD/IV venofer/keytruda-Dr.B

## 2018-06-21 NOTE — Progress Notes (Signed)
[place]  [phone] Fax:(336) [phone]   Name: Chad [name] Date: 06/21/2018 MRN: [id number]  DOB: 30-Mar-1961  Patient Care Team: [name], MD as PCP - General (Internal Medicine)    REASON FOR CONSULTATION: Palliative Care consult requested for this 57 y.o. 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 Patient was hospitalized 04/25/18 to 05/07/18 with multiorgan failure, VDRF, and sepsis from UTI. He was found to have R. Sided hydronephrosis requiring stenting. Urine cytology revealed high-grade urothelial cancer. Palliative care was asked to follow to help address goals.    SOCIAL HISTORY:    Patient is divorced. He lived in [place] but moved here in 2019. Patient has a son, who lives in [place]. He has four other children in [place] and [place].   ADVANCE DIRECTIVES:  unknown  CODE STATUS: Full code  PAST MEDICAL HISTORY: Past Medical History:  Diagnosis Date  . Anxiety   . Cancer [facility])    prostate  . Depression   . Hyperlipidemia   . Nerve pain   . Stroke [facility])     PAST SURGICAL HISTORY:  Past Surgical History:  Procedure Laterality Date  . COLON SURGERY     En bloc extended right hemicolectomy 07/2017  . CYSTOSCOPY WITH STENT PLACEMENT Right 04/25/2018   Procedure: CYSTOSCOPY WITH STENT PLACEMENT;  Surgeon: Hollice Espy, MD;  Location: [facility] ORS;  Service: Urology;  Laterality: Right;  . tumor removed       HEMATOLOGY/ONCOLOGY HISTORY:  Oncology History   # SEP-OCT 2019-right renal pelvis/ ureteral [cytology positive HIGH grade urothelial carcinoma [Dr.Brandon]  s/p stent placement  #Right ureteral obstruction status post stent placement  # JAN 2019- Right Colon ca [ T4N1]  [[facility]]; NO adjuvant therapy  # Hep C/ # stroke of left  side/weakness-2018 Nov [[place]]; active smoker  DIAGNOSIS: # 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; # Colon ca- stage III  GOALS: palliative  CURRENT/MOST RECENT THERAPY '[ ]'$       Urothelial cancer (HCC)    Initial Diagnosis    Urothelial cancer ([place])     Ureteral cancer, right ([place])   05/26/2018 Initial Diagnosis    Ureteral cancer, right ([place])    06/20/2018 -  Chemotherapy    The patient had pembrolizumab (KEYTRUDA) 200 mg in sodium chloride 0.9 % 50 mL chemo infusion, 200 mg, Intravenous, Once, 1 of 4 cycles  for chemotherapy treatment.      ALLERGIES:  is allergic to penicillins.  MEDICATIONS:  Current Outpatient Medications  Medication Sig Dispense Refill  . acetaminophen (TYLENOL) 325 MG tablet Take 2 tablets by mouth as needed.    Marland Kitchen atorvastatin (LIPITOR) 80 MG tablet Take 1 tablet (80 mg total) by mouth daily. 90 tablet 3  . escitalopram (LEXAPRO) 20 MG tablet Take 1 tablet (20 mg total) by mouth daily. 90 tablet 3  . gabapentin (NEURONTIN) 100 MG capsule Take one to 2 tabs for pain 3 x day (Patient taking differently: Take 100 mg by mouth 3 (three) times daily. ) 180 capsule 3  . pantoprazole (PROTONIX) 40 MG tablet Take 40 mg by mouth daily.    . QUEtiapine (SEROQUEL) 50 MG tablet Take 1 tablet (50 mg total) by mouth at bedtime. 90 tablet 3   No current facility-administered medications for this visit.  VITAL SIGNS: There were no vitals taken for this visit. There were no vitals filed for this visit.  Estimated body mass index is 25.04 kg/m as calculated from the following:   Height as of an earlier encounter on 06/21/18: '5\' 7"'$  (1.702 m).   Weight as of an earlier encounter on 06/21/18: 159 lb 14.4 oz (72.5 kg).  LABS: CBC:    Component Value Date/Time   WBC 4.5 06/21/2018 1125   HGB 10.1 (L) 06/21/2018 1125   HCT 33.8 (L) 06/21/2018 1125   PLT 175 06/21/2018 1125   MCV 74.6 (L) 06/21/2018 1125   NEUTROABS 3.1 06/21/2018 1125   LYMPHSABS 0.8 06/21/2018 1125    MONOABS 0.3 06/21/2018 1125   EOSABS 0.3 06/21/2018 1125   BASOSABS 0.0 06/21/2018 1125   Comprehensive Metabolic Panel:    Component Value Date/Time   NA 137 06/21/2018 1125   K 3.8 06/21/2018 1125   CL 106 06/21/2018 1125   CO2 23 06/21/2018 1125   BUN 16 06/21/2018 1125   CREATININE 1.20 06/21/2018 1125   GLUCOSE 94 06/21/2018 1125   CALCIUM 8.7 (L) 06/21/2018 1125   AST 79 (H) 06/21/2018 1125   ALT 53 (H) 06/21/2018 1125   ALKPHOS 101 06/21/2018 1125   BILITOT 0.7 06/21/2018 1125   PROT 7.4 06/21/2018 1125   ALBUMIN 3.6 06/21/2018 1125    RADIOGRAPHIC STUDIES: Nm Pet Image Initial (pi) Skull Base To Thigh  Result Date: 05/31/2018 CLINICAL DATA:  Subsequent. Treatment strategy for urothelial carcinoma. EXAM: NUCLEAR MEDICINE PET SKULL BASE TO THIGH TECHNIQUE: 9.38 mCi F-18 FDG was injected intravenously. Full-ring PET imaging was performed from the skull base to thigh after the radiotracer. CT data was obtained and used for attenuation correction and anatomic localization. Fasting blood glucose: 75 mg/dl COMPARISON:  CT chest abdomen and pelvis from 05/07/2018 FINDINGS: Mediastinal blood pool activity: SUV max 2.16 NECK: No hypermetabolic lymph nodes in the neck. Incidental CT findings: none CHEST: Mildly hypermetabolic bilateral axillary lymph nodes are identified. The index right axillary lymph node measures 1 cm short axis and has an SUV max of 6.8. Left axillary lymph node measures 1.1 cm and has an SUV max of 4.09. No hypermetabolic supraclavicular, mediastinal or hilar lymph nodes. No pleural effusion identified.No hypermetabolic pulmonary nodules or masses identified. Incidental CT findings: Calcification in the LAD, left main coronary arteries noted. ABDOMEN/PELVIS: There is no abnormal uptake identified within the liver, pancreas, or spleen. The adrenal glands are unremarkable. No hypermetabolic lymph nodes identified within the abdomen. Mildly hypermetabolic bilateral external  iliac and bilateral inguinal lymph nodes are identified. - left pelvic sidewall lymph node measures 1.3 cm and has an SUV max of 3.30. - right external iliac lymph node is hypermetabolic measuring 1.2 cm within SUV max of 3.73. - index right inguinal lymph node measures 1.5 cm and has an SUV max of 3.28. -  left inguinal lymph node measures 1.7 cm within SUV max of 3.11. Incidental CT findings: Aortic atherosclerosis identified. No aneurysm. Right sided nephroureteral stent is in place. If hypermetabolic soft tissue within the right renal pelvis and proximal right ureter is again noted compatible with urothelial lesion. SKELETON: No focal hypermetabolic activity to suggest skeletal metastasis. Incidental CT findings: none IMPRESSION: 1. Mild to moderate increased FDG uptake is associated with bilateral axillary, bilateral external iliac and bilateral inguinal lymph nodes. The most intensely hypermetabolic lymph node is in the right axillary region with an SUV max of 6.8. The hypermetabolic right axillary lymph node  should be easily amendable to ultrasound-guided percutaneous tissue sampling. 2. Asymmetric increased soft tissue within the right renal pelvis and proximal right ureter compatible with urothelial neoplasm. Status post right double-J nephroureteral stent placement. 3.  Aortic Atherosclerosis (ICD10-I70.0). Electronically Signed   By: Kerby Moors M.D.   On: 05/31/2018 13:22   [place] Core Biopsy (lymph Nodes)  Result Date: 06/08/2018 CLINICAL DATA:  History of high-grade urothelial carcinoma of the right proximal ureter. PET scan has demonstrated hypermetabolic bilateral axillary, iliac and inguinal lymph nodes. The patient presents for biopsy of the largest right axillary lymph node that demonstrates increased metabolic activity by PET scan. EXAM: ULTRASOUND GUIDED CORE BIOPSY OF RIGHT AXILLARY LYMPH NODE MEDICATIONS: 2.0 mg IV Versed; 100 mcg IV Fentanyl Total Moderate Sedation Time: 14 minutes. The  patient's level of consciousness and physiologic status were continuously monitored during the procedure by Radiology nursing. PROCEDURE: The procedure, risks, benefits, and alternatives were explained to the patient. Questions regarding the procedure were encouraged and answered. The patient understands and consents to the procedure. A time out was performed prior to initiating the procedure. The right axillary region was prepped with chlorhexidine in a sterile fashion, and a sterile drape was applied covering the operative field. A sterile gown and sterile gloves were used for the procedure. Local anesthesia was provided with 1% Lidocaine. A 16 gauge core needle biopsy device was utilized in obtaining 5 samples through a right axillary lymph node. Material was submitted on saline soaked Telfa. COMPLICATIONS: None. FINDINGS: The largest lymph node of the right axilla located in the lateral axilla corresponds to the metabolically active lymph node by PET. This node measures approximately 3.5 x 1.3 x 0.9 cm. Solid tissue was obtained. IMPRESSION: Ultrasound-guided core biopsy performed of an enlarged right axillary lymph node. Electronically Signed   By: Aletta Edouard M.D.   On: 06/08/2018 15:38    PERFORMANCE STATUS (ECOG) : 3 - Symptomatic, >50% confined to bed  Review of Systems As noted above. Otherwise, a complete review of systems is negative.  Physical Exam General: NAD, frail appearing, thin Cardiovascular: regular rate and rhythm Pulmonary: clear ant fields Abdomen: soft, nontender, + bowel sounds Extremities: no edema, L. Knee brace Skin: no rashes Neurological: L. Sided Weakness but otherwise nonfocal  IMPRESSION: I met with patient while he was receiving a Venofer infusion.  He is also being started on Keytruda. Patient denies any symptomatic issues today.  He is comfortable appearing.  Patient is currently at [facility] for rehab. He reports feeling like he is  functionally improving but it sounds like he is primarily chairbound. Patient intends to move in with his son following rehab. He is not sure when that will be. I will ask that Christin Gusler, NP with community palliative care check on him at rehab.   Will plan to follow up with him at the time of his next visit here in three weeks. Will need to clarify ACP and decision making.   PLAN: Continue treatment plan as outlined by oncology Amb referral to [facility] Will follow up with him here in three weeks.    Patient expressed understanding and was in agreement with this plan. He also understands that He can call clinic at any time with any questions, concerns, or complaints.     Time Total: 15 minutes  Visit consisted of counseling and education dealing with the complex and emotionally intense issues of symptom management and palliative care in the setting of serious  and potentially life-threatening illness.Greater than 50%  of this time was spent counseling and coordinating care related to the above assessment and plan.  Signed by: Altha Harm, PhD, NP-C [phone] (Work Cell)

## 2018-06-21 NOTE — Progress Notes (Signed)
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       HISTORY OF PRESENTING ILLNESS: Patient is a poor historian.  Is alone. Chad [name] 57 y.o.  male above multiple medical problems is here to proceed immunotherapy/and also IV Venofer.  Patient has intermittent blood in urine which is currently resolved.   Continues to complain of left knee pain.  Otherwise denies any new onset of aches and pains.  Patient continues to have chronic shortness of breath chronic cough.  Not any worse.  Continues to smoke.   Review of Systems  Constitutional: Positive for malaise/fatigue. Negative for chills, diaphoresis, fever and weight loss.  HENT: Negative for nosebleeds and sore throat.   Eyes: Negative for double vision.  Respiratory: Positive for cough and shortness of breath. Negative for hemoptysis and wheezing.   Cardiovascular: Negative for chest pain, palpitations, orthopnea and leg  swelling.  Gastrointestinal: Negative for abdominal pain, blood in stool, constipation, diarrhea, heartburn, melena, nausea and vomiting.  Genitourinary: Positive for hematuria. Negative for dysuria, frequency and urgency.  Musculoskeletal: Positive for back pain and joint pain.  Skin: Negative.  Negative for itching and rash.  Neurological: Positive for focal weakness. Negative for dizziness, tingling, weakness and headaches.       Chronic left-sided weakness upper than lower extremity.  Endo/Heme/Allergies: Does not bruise/bleed easily.  Psychiatric/Behavioral: Negative for depression. The patient is not nervous/anxious and does not have insomnia.      MEDICAL HISTORY:  Past Medical History:  Diagnosis Date  . Anxiety   . Cancer [facility])    prostate  . Depression   . Hyperlipidemia   . Nerve pain   . Stroke [facility])     SURGICAL HISTORY: Past Surgical History:  Procedure Laterality Date  . COLON SURGERY     En bloc extended right hemicolectomy 07/2017  . CYSTOSCOPY WITH STENT PLACEMENT Right 04/25/2018   Procedure: CYSTOSCOPY WITH STENT PLACEMENT;  Surgeon: Hollice Espy, MD;  Location: [facility] ORS;  Service: Urology;  Laterality: Right;  . tumor removed       SOCIAL HISTORY: Social History   Socioeconomic History  . Marital status: Single    Spouse name: Not on file  . Number of children: Not on file  . Years of education: Not on file  . Highest education level: Not on file  Occupational History  . Not on file  Social Needs  . Financial resource strain:  Not on file  . Food insecurity:    Worry: Not on file    Inability: Not on file  . Transportation needs:    Medical: Not on file    Non-medical: Not on file  Tobacco Use  . Smoking status: Current Every Day Smoker    Packs/day: 1.00    Types: Cigarettes  . Smokeless tobacco: Never Used  Substance and Sexual Activity  . Alcohol use: Never    Frequency: Never  . Drug use: Never  . Sexual activity: Not on file   Lifestyle  . Physical activity:    Days per week: Not on file    Minutes per session: Not on file  . Stress: Not on file  Relationships  . Social connections:    Talks on phone: Not on file    Gets together: Not on file    Attends religious service: Not on file    Active member of club or organization: Not on file    Attends meetings of clubs or organizations: Not on file    Relationship status: Not on file  . Intimate partner violence:    Fear of current or ex partner: Not on file    Emotionally abused: Not on file    Physically abused: Not on file    Forced sexual activity: Not on file  Other Topics Concern  . Not on file  Social History Narrative    used to live [place]; moved  To [place]- end of April 2019; in Nursing home; 1pp/day; quit alcohol. 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     FAMILY HISTORY: Family History  Problem Relation Age of Onset  . Prostate cancer Father     ALLERGIES:  is allergic to penicillins.  MEDICATIONS:  Current Outpatient Medications  Medication Sig Dispense Refill  . acetaminophen (TYLENOL) 325 MG tablet Take 2 tablets by mouth as needed.    Marland Kitchen atorvastatin (LIPITOR) 80 MG tablet Take 1 tablet (80 mg total) by mouth daily. 90 tablet 3  . escitalopram (LEXAPRO) 20 MG tablet Take 1 tablet (20 mg total) by mouth daily. 90 tablet 3  . gabapentin (NEURONTIN) 100 MG capsule Take one to 2 tabs for pain 3 x day (Patient taking differently: Take 100 mg by mouth 3 (three) times daily. ) 180 capsule 3  . pantoprazole (PROTONIX) 40 MG tablet Take 40 mg by mouth daily.    . QUEtiapine (SEROQUEL) 50 MG tablet Take 1 tablet (50 mg total) by mouth at bedtime. 90 tablet 3   No current facility-administered medications for this visit.       Marland Kitchen  PHYSICAL EXAMINATION: ECOG PERFORMANCE STATUS: 1 - Symptomatic but completely ambulatory  Vitals:   06/21/18 1131  BP: 120/71  Pulse: 83  Resp: 20  Temp: 97.9 F (36.6 C)   Filed Weights   06/21/18 1131  Weight: 159  lb 14.4 oz (72.5 kg)    Physical Exam  Constitutional: He is oriented to person, place, and time.  In wheelchair. Cachectic.  Appears older than his stated age.  Alone.  HENT:  Head: Normocephalic and atraumatic.  Mouth/Throat: Oropharynx is clear and moist. No oropharyngeal exudate.  Eyes: Pupils are equal, round, and reactive to light.  Neck: Normal range of motion. Neck supple.  Cardiovascular: Normal rate and regular rhythm.  Pulmonary/Chest: No respiratory distress. He has no wheezes.  Abdominal: Soft. Bowel sounds are normal. He exhibits no distension and no mass. There is no tenderness. There is no rebound  and no guarding.  Musculoskeletal: Normal range of motion. He exhibits no edema or tenderness.  Neurological: He is alert and oriented to person, place, and time.  Weakness left upper and lower extremity.  Skin: Skin is warm.  Psychiatric: Affect normal.     LABORATORY DATA:  I have reviewed the data as listed Lab Results  Component Value Date   WBC 4.5 06/21/2018   HGB 10.1 (L) 06/21/2018   HCT 33.8 (L) 06/21/2018   MCV 74.6 (L) 06/21/2018   PLT 175 06/21/2018   Recent Labs    06/07/18 1049 06/14/18 1253 06/21/18 1125  NA 135 136 137  K 3.9 3.8 3.8  CL 106 105 106  CO2 21* 22 23  GLUCOSE 116* 120* 94  BUN '16 15 16  ' CREATININE 1.02 1.08 1.20  CALCIUM 8.3* 8.8* 8.7*  GFRNONAA >60 >60 >60  GFRAA >60 >60 >60  PROT 7.3 7.6 7.4  ALBUMIN 3.2* 3.5 3.6  AST 56* 65* 79*  ALT 34 45* 53*  ALKPHOS 97 106 101  BILITOT 0.5 0.5 0.7    RADIOGRAPHIC STUDIES: I have personally reviewed the radiological images as listed and agreed with the findings in the report. Nm Pet Image Initial (pi) Skull Base To Thigh  Result Date: 05/31/2018 CLINICAL DATA:  Subsequent. Treatment strategy for urothelial carcinoma. EXAM: NUCLEAR MEDICINE PET SKULL BASE TO THIGH TECHNIQUE: 9.38 mCi F-18 FDG was injected intravenously. Full-ring PET imaging was performed from the skull base to  thigh after the radiotracer. CT data was obtained and used for attenuation correction and anatomic localization. Fasting blood glucose: 75 mg/dl COMPARISON:  CT chest abdomen and pelvis from 05/07/2018 FINDINGS: Mediastinal blood pool activity: SUV max 2.16 NECK: No hypermetabolic lymph nodes in the neck. Incidental CT findings: none CHEST: Mildly hypermetabolic bilateral axillary lymph nodes are identified. The index right axillary lymph node measures 1 cm short axis and has an SUV max of 6.8. Left axillary lymph node measures 1.1 cm and has an SUV max of 4.09. No hypermetabolic supraclavicular, mediastinal or hilar lymph nodes. No pleural effusion identified.No hypermetabolic pulmonary nodules or masses identified. Incidental CT findings: Calcification in the LAD, left main coronary arteries noted. ABDOMEN/PELVIS: There is no abnormal uptake identified within the liver, pancreas, or spleen. The adrenal glands are unremarkable. No hypermetabolic lymph nodes identified within the abdomen. Mildly hypermetabolic bilateral external iliac and bilateral inguinal lymph nodes are identified. - left pelvic sidewall lymph node measures 1.3 cm and has an SUV max of 3.30. - right external iliac lymph node is hypermetabolic measuring 1.2 cm within SUV max of 3.73. - index right inguinal lymph node measures 1.5 cm and has an SUV max of 3.28. -  left inguinal lymph node measures 1.7 cm within SUV max of 3.11. Incidental CT findings: Aortic atherosclerosis identified. No aneurysm. Right sided nephroureteral stent is in place. If hypermetabolic soft tissue within the right renal pelvis and proximal right ureter is again noted compatible with urothelial lesion. SKELETON: No focal hypermetabolic activity to suggest skeletal metastasis. Incidental CT findings: none IMPRESSION: 1. Mild to moderate increased FDG uptake is associated with bilateral axillary, bilateral external iliac and bilateral inguinal lymph nodes. The most intensely  hypermetabolic lymph node is in the right axillary region with an SUV max of 6.8. The hypermetabolic right axillary lymph node should be easily amendable to ultrasound-guided percutaneous tissue sampling. 2. Asymmetric increased soft tissue within the right renal pelvis and proximal right ureter compatible with urothelial neoplasm. Status post right  double-J nephroureteral stent placement. 3.  Aortic Atherosclerosis (ICD10-I70.0). Electronically Signed   By: Kerby Moors M.D.   On: 05/31/2018 13:22   [place] Core Biopsy (lymph Nodes)  Result Date: 06/08/2018 CLINICAL DATA:  History of high-grade urothelial carcinoma of the right proximal ureter. PET scan has demonstrated hypermetabolic bilateral axillary, iliac and inguinal lymph nodes. The patient presents for biopsy of the largest right axillary lymph node that demonstrates increased metabolic activity by PET scan. EXAM: ULTRASOUND GUIDED CORE BIOPSY OF RIGHT AXILLARY LYMPH NODE MEDICATIONS: 2.0 mg IV Versed; 100 mcg IV Fentanyl Total Moderate Sedation Time: 14 minutes. The patient's level of consciousness and physiologic status were continuously monitored during the procedure by Radiology nursing. PROCEDURE: The procedure, risks, benefits, and alternatives were explained to the patient. Questions regarding the procedure were encouraged and answered. The patient understands and consents to the procedure. A time out was performed prior to initiating the procedure. The right axillary region was prepped with chlorhexidine in a sterile fashion, and a sterile drape was applied covering the operative field. A sterile gown and sterile gloves were used for the procedure. Local anesthesia was provided with 1% Lidocaine. A 16 gauge core needle biopsy device was utilized in obtaining 5 samples through a right axillary lymph node. Material was submitted on saline soaked Telfa. COMPLICATIONS: None. FINDINGS: The largest lymph node of the right axilla located in the lateral  axilla corresponds to the metabolically active lymph node by PET. This node measures approximately 3.5 x 1.3 x 0.9 cm. Solid tissue was obtained. IMPRESSION: Ultrasound-guided core biopsy performed of an enlarged right axillary lymph node. Electronically Signed   By: Aletta Edouard M.D.   On: 06/08/2018 15:38    ASSESSMENT & PLAN:   Ureteral cancer, right ([place]) # High-grade urothelial cancer/cytology; likely of the right renal pelvis /upper ureter.  Nov 2019- PET shows- right ureteral uptake; and also shows in axilla/abdomen/pelvic adenopathy- "suspicious for metastatic disease".  However right axillary lymph node biopsy negative for malignancy.  #After extensive discussion of the tumor conference-as patient is currently a poor candidate for immediate ureteronephrectomy; given the presence of abnormal lymph nodes on the CT scans- " presumed" metastatic disease.  Proceed with immunotherapy given MSI high status.  #Again reviewed the potential side effects of immunotherapy including but not limited to diarrhea pneumonitis skin rash etc.  #Colon cancer stage ? III [T4N1] stage IV-difficult to assess/difficult to access intra-abdominal/pelvic lymph nodes for biopsy.  #We will plan to obtain the CT scan images from [place] for review with the current scans to assess for lymphadenopathy as discussed at the tumor conference.   # family History of cancer- dad- prostate ca [at 56y]; brother- 22 died of prostate cancer; brother- 42- no cancers [[place]]; sonGerald [name] [[place]];Chad [name]-32y prostate ca [facility] [place]]; daughter- 73 [[place]]; another daughter 81 [[place]/addict]. will refer genetics counseling. Given MSI- abnormal; highly suspicious of Lynch syndrome.  Refer to genetic counseling.  Patient's son [name] aware of high possible [place] syndrome.  # Iron deficiency anemia-hemoglobin 8.7-recent colon cancer/urologic loss-proceed with IV Venofer today; and weekly x4.  Continue IV iron.  # Hepatitis C-  Positive/HCV RNA positive.  Will follow-up with GI  # Left knee pain status post recent steroid injection-stable.  # DISPOSITION: # IV venofer/Keytruda today. # genetics referral Dx: ? Lynch syndrome # Follow up in 3 weeks/MD/IV venofer/keytruda-Dr.B  All questions were answered. The patient knows to call the clinic with any problems, questions or concerns.    Lenetta Quaker  Ann Lions, MD 06/23/2018 8:48 AM

## 2018-06-23 ENCOUNTER — Telehealth: Payer: Self-pay | Admitting: Internal Medicine

## 2018-06-23 NOTE — Telephone Encounter (Signed)
kristie-as discussed at the last tumor conference; please obtain the CT scans chest and pelvis [from [place]; ? Jan 2019]; have the radiologist compared to the most recent CT/PET scan. Thank you GB

## 2018-06-28 NOTE — Telephone Encounter (Signed)
Request sent to [facility] medical records. Fax [phone]. Request made for CT AP performed 06/07/2017 and CT CAP performed 08/07/2017.

## 2018-06-29 ENCOUNTER — Ambulatory Visit
Admission: RE | Admit: 2018-06-29 | Discharge: 2018-06-29 | Disposition: A | Discharge status: Home / Self Care | Payer: Self-pay | Source: Ambulatory Visit | Attending: Internal Medicine | Admitting: Internal Medicine

## 2018-06-29 ENCOUNTER — Other Ambulatory Visit: Payer: Self-pay | Admitting: Internal Medicine

## 2018-06-29 ENCOUNTER — Other Ambulatory Visit: Payer: Self-pay

## 2018-06-29 DIAGNOSIS — C689 Malignant neoplasm of urinary organ, unspecified: Secondary | ICD-10-CM

## 2018-06-29 DIAGNOSIS — R109 Unspecified abdominal pain: Secondary | ICD-10-CM

## 2018-06-29 NOTE — Progress Notes (Signed)
Images  received from [place]. Order placed for radiology consult. Images delivered to radiology.

## 2018-07-02 ENCOUNTER — Non-Acute Institutional Stay: Payer: Medicaid Other | Admitting: Nurse Practitioner

## 2018-07-02 ENCOUNTER — Encounter: Payer: Self-pay | Admitting: Nurse Practitioner

## 2018-07-02 VITALS — BP 130/80 | HR 74 | Temp 99.1°F | Resp 20 | Wt 166.4 lb

## 2018-07-02 DIAGNOSIS — Z515 Encounter for palliative care: Secondary | ICD-10-CM

## 2018-07-02 DIAGNOSIS — R531 Weakness: Secondary | ICD-10-CM | POA: Insufficient documentation

## 2018-07-02 DIAGNOSIS — R630 Anorexia: Secondary | ICD-10-CM | POA: Insufficient documentation

## 2018-07-02 DIAGNOSIS — G893 Neoplasm related pain (acute) (chronic): Secondary | ICD-10-CM | POA: Insufficient documentation

## 2018-07-02 NOTE — Progress Notes (Signed)
[facility] Telephone: [phone] Fax: [phone]  PATIENT NAME: Chad [name] DOB: 1960/12/08 MRN: [id number]  PRIMARY CARE PROVIDER:   Dr Margarita Rana PROVIDER:  Dr Tamala Julian; [place]:   Own responsible party, Chad [name] son [phone]   ASSESSMENT:      I visit and observed Chad [name]. We talked about purpose or palliative medicine visit and he was in consent for visit. We talked about symptoms of pain which he verbalizes that he does have intermittently to his left knee and hip. He verbalize the Tramadol doesn't always improve pain relief and it makes it difficult for him to sleep. He endorses he does not want anything for sleep as he would like for pain management to be re-evaluated. We talked about the option of patches or Voltaren gel will follow up with Dr Tamala Julian. We talked about his visits to the cancer center in [place] infusions he has been receiving. We talked about his cancer, thoughts and quality of life. He is a DNR. He does want to treat what is treatable. We talked about residing long-term care at facility with challenges and barriers that he has been encountering though praise him for working difficulties. We talked about smoking cessation as he continues to go to the smoking patio. We talked about role of palliative medicine and plan of care. He expressed he was thankful for supportive visit and discussed will follow up in one month if needed or sooner should he declined. Therapeutic listening and emotional support provided. Questions answered to satisfaction. I attempt to contact his son, Chad [name]. I have made a nursing staff in the new changes to goes our current plan of care.  RECOMMENDATIONS and PLAN:   1.Anorexia R63.0 secondary to protein calorie malnutrition related to cancer improving ongoing. Continue to monitor daily weights, supplements, supportive measures and courage to eat  2. Generalized weakness  R53.1  secondary to multiple chronic disease progression including anemia, late onset CVA, urothelial and colon cancer.  Encourage energy conservation and rest times.  3. Chronic pain secondary to neoplasm G89.3 monitor on pain scale, monitor efficacy vs adverse side effects. Currently taking tylenol with tramadol. Notified Dr Tamala Julian recommend add volaren gel or lidocaine patches for analgesic relief  4. Palliative care encounter Z51.5; Ongoing monitoring chronic disease management, symptoms, goc with emotional support.  I spent 60 minutes providing this consultation,  From 11:00am to 12:00pm. More than 50% of the time in this consultation was spent coordinating communication.   HISTORY OF PRESENT ILLNESS:  Chad [name] is a 57 y.o. year old with multiple medical problems including late onset CVA, urothelial cancer, prostate cancer, chronic hepatitis C, hyperlipidemia, colon surgery, protein calorie malnutrition, anxiety, depression. He was hospitalized 9/20 03/2017 for shortness of breath with confusion family found him outside calling EMS to find him hypothermic and respiratory distress, hypertensive with acidosis. Workout significant for septic shock secondary to UTI with right hydronephrosis s/p right ureteral stent with the cology reviewing high-grade urothelial carcinoma. He did require intubation during this hospitalization for respiratory failure and acute metabolic encephalopathy multifactorial with MRI showing small subdural hematomas appearing to be chronic with Neurology determining old CVA with left upper extremity and lower extremity weakness. Elevated lft secondary to septic shock in the setting of chronic hepatitis. Microcytic anemia with acute kidney injury secondary to septic shock which improved. Chronic dysphagia. COPD with tobacco abuse. He was just charged to short-term rehab to transition to long-term care. He was followed by palliative  medicine during hospitalization and at facility  with last visit 10 / 24 / 2019 for goals of care and code status change to DNR at that time. He wish to continue aggressive interventions for treatment of cancer. He continues to reside in skilled long-term care facility, transfers himself he continues to reside in skilled long-term care facility, transfers himself and performs ideas. He does feed himself appetite remains poor. He does advocate for his own needs. He continues to go to the cancer center for iron infusions followed by outpatient oncology. At present he is sitting in his room in the wheelchair. He appears thin, chronically ill but comfortable. No visitors present. Palliative Care was asked to help address goals of care.   CODE STATUS: DNR  PPS: 50% HOSPICE ELIGIBILITY/DIAGNOSIS: possible if decides to stop aggressive interventions/infusions  PAST MEDICAL HISTORY:  Past Medical History:  Diagnosis Date  . Anxiety   . Cancer [facility])    prostate  . Depression   . Hyperlipidemia   . Nerve pain   . Stroke [facility])     SOCIAL HX:  Social History   Tobacco Use  . Smoking status: Current Every Day Smoker    Packs/day: 1.00    Types: Cigarettes  . Smokeless tobacco: Never Used  Substance Use Topics  . Alcohol use: Never    Frequency: Never    ALLERGIES:  Allergies  Allergen Reactions  . Penicillins Rash     PERTINENT MEDICATIONS:  Outpatient Encounter Medications as of 07/02/2018  Medication Sig  . acetaminophen (TYLENOL) 325 MG tablet Take 2 tablets by mouth as needed.  Marland Kitchen atorvastatin (LIPITOR) 80 MG tablet Take 1 tablet (80 mg total) by mouth daily.  Marland Kitchen escitalopram (LEXAPRO) 20 MG tablet Take 1 tablet (20 mg total) by mouth daily.  Marland Kitchen gabapentin (NEURONTIN) 100 MG capsule Take one to 2 tabs for pain 3 x day (Patient taking differently: Take 100 mg by mouth 3 (three) times daily. )  . pantoprazole (PROTONIX) 40 MG tablet Take 40 mg by mouth daily.  . QUEtiapine (SEROQUEL) 50 MG tablet Take 1 tablet (50 mg total) by mouth at  bedtime.   No facility-administered encounter medications on file as of 07/02/2018.     PHYSICAL EXAM:   General: NAD, frail appearing, thin pleasant male Cardiovascular: regular rate and rhythm Pulmonary: clear ant fields Abdomen: soft, nontender, + bowel sounds GU: no suprapubic tenderness Extremities: no edema, no joint deformities Skin: no rashes Neurological: generalized weakness  Aragorn Recker Ihor Gully, NP

## 2018-07-12 ENCOUNTER — Other Ambulatory Visit: Payer: Self-pay

## 2018-07-12 ENCOUNTER — Other Ambulatory Visit: Payer: Self-pay | Admitting: *Deleted

## 2018-07-12 ENCOUNTER — Inpatient Hospital Stay: Payer: Medicaid Other | Attending: Internal Medicine

## 2018-07-12 ENCOUNTER — Inpatient Hospital Stay: Payer: Medicaid Other

## 2018-07-12 ENCOUNTER — Encounter: Payer: Medicaid Other | Admitting: Hospice and Palliative Medicine

## 2018-07-12 ENCOUNTER — Inpatient Hospital Stay (HOSPITAL_BASED_OUTPATIENT_CLINIC_OR_DEPARTMENT_OTHER): Payer: Medicaid Other | Admitting: Internal Medicine

## 2018-07-12 ENCOUNTER — Encounter: Payer: Self-pay | Admitting: Internal Medicine

## 2018-07-12 VITALS — BP 106/69 | HR 90 | Temp 98.3°F

## 2018-07-12 DIAGNOSIS — Z79899 Other long term (current) drug therapy: Secondary | ICD-10-CM

## 2018-07-12 DIAGNOSIS — C661 Malignant neoplasm of right ureter: Secondary | ICD-10-CM | POA: Diagnosis not present

## 2018-07-12 DIAGNOSIS — F418 Other specified anxiety disorders: Secondary | ICD-10-CM | POA: Diagnosis not present

## 2018-07-12 DIAGNOSIS — R5381 Other malaise: Secondary | ICD-10-CM

## 2018-07-12 DIAGNOSIS — Z9049 Acquired absence of other specified parts of digestive tract: Secondary | ICD-10-CM | POA: Insufficient documentation

## 2018-07-12 DIAGNOSIS — B1920 Unspecified viral hepatitis C without hepatic coma: Secondary | ICD-10-CM | POA: Diagnosis not present

## 2018-07-12 DIAGNOSIS — I69354 Hemiplegia and hemiparesis following cerebral infarction affecting left non-dominant side: Secondary | ICD-10-CM | POA: Diagnosis not present

## 2018-07-12 DIAGNOSIS — M549 Dorsalgia, unspecified: Secondary | ICD-10-CM | POA: Diagnosis not present

## 2018-07-12 DIAGNOSIS — R531 Weakness: Secondary | ICD-10-CM

## 2018-07-12 DIAGNOSIS — F17210 Nicotine dependence, cigarettes, uncomplicated: Secondary | ICD-10-CM | POA: Diagnosis not present

## 2018-07-12 DIAGNOSIS — E785 Hyperlipidemia, unspecified: Secondary | ICD-10-CM

## 2018-07-12 DIAGNOSIS — M25562 Pain in left knee: Secondary | ICD-10-CM | POA: Diagnosis not present

## 2018-07-12 DIAGNOSIS — R5383 Other fatigue: Secondary | ICD-10-CM

## 2018-07-12 DIAGNOSIS — G8929 Other chronic pain: Secondary | ICD-10-CM | POA: Insufficient documentation

## 2018-07-12 DIAGNOSIS — Z515 Encounter for palliative care: Secondary | ICD-10-CM

## 2018-07-12 DIAGNOSIS — D500 Iron deficiency anemia secondary to blood loss (chronic): Secondary | ICD-10-CM

## 2018-07-12 DIAGNOSIS — C189 Malignant neoplasm of colon, unspecified: Secondary | ICD-10-CM | POA: Insufficient documentation

## 2018-07-12 DIAGNOSIS — D509 Iron deficiency anemia, unspecified: Secondary | ICD-10-CM

## 2018-07-12 DIAGNOSIS — Z1509 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: Secondary | ICD-10-CM

## 2018-07-12 DIAGNOSIS — Z5112 Encounter for antineoplastic immunotherapy: Secondary | ICD-10-CM | POA: Diagnosis not present

## 2018-07-12 LAB — COMPREHENSIVE METABOLIC PANEL
ALT: 91 U/L — AB (ref 0–44)
AST: 77 U/L — ABNORMAL HIGH (ref 15–41)
Albumin: 3 g/dL — ABNORMAL LOW (ref 3.5–5.0)
Alkaline Phosphatase: 136 U/L — ABNORMAL HIGH (ref 38–126)
Anion gap: 4 — ABNORMAL LOW (ref 5–15)
BUN: 19 mg/dL (ref 6–20)
CHLORIDE: 108 mmol/L (ref 98–111)
CO2: 21 mmol/L — ABNORMAL LOW (ref 22–32)
CREATININE: 0.99 mg/dL (ref 0.61–1.24)
Calcium: 8.4 mg/dL — ABNORMAL LOW (ref 8.9–10.3)
GFR calc Af Amer: >60 mL/min (ref >60)
GFR calc non Af Amer: >60 mL/min (ref >60)
Glucose, Bld: 146 mg/dL — ABNORMAL HIGH (ref 70–99)
Potassium: 4 mmol/L (ref 3.5–5.1)
Sodium: 133 mmol/L — ABNORMAL LOW (ref 135–145)
Total Bilirubin: 0.7 mg/dL (ref 0.3–1.2)
Total Protein: 7 g/dL (ref 6.5–8.1)

## 2018-07-12 LAB — CBC WITH DIFFERENTIAL/PLATELET
Abs Immature Granulocytes: 0.01 10*3/uL (ref 0.00–0.07)
Basophils Absolute: 0 10*3/uL (ref 0.0–0.1)
Basophils Relative: 1 %
Eosinophils Absolute: 0.3 10*3/uL (ref 0.0–0.5)
Eosinophils Relative: 4 %
HCT: 32.4 % — ABNORMAL LOW (ref 39.0–52.0)
Hemoglobin: 10.1 g/dL — ABNORMAL LOW (ref 13.0–17.0)
Immature Granulocytes: 0 %
Lymphocytes Relative: 11 %
Lymphs Abs: 0.7 10*3/uL (ref 0.7–4.0)
MCH: 23.5 pg — ABNORMAL LOW (ref 26.0–34.0)
MCHC: 31.2 g/dL (ref 30.0–36.0)
MCV: 75.5 fL — ABNORMAL LOW (ref 80.0–100.0)
MONOS PCT: 6 %
Monocytes Absolute: 0.4 10*3/uL (ref 0.1–1.0)
Neutro Abs: 5.1 10*3/uL (ref 1.7–7.7)
Neutrophils Relative %: 78 %
Platelets: 274 10*3/uL (ref 150–400)
RBC: 4.29 MIL/uL (ref 4.22–5.81)
RDW: 26.3 % — ABNORMAL HIGH (ref 11.5–15.5)
WBC: 6.5 10*3/uL (ref 4.0–10.5)
nRBC: 0 % (ref 0.0–0.2)

## 2018-07-12 MED ORDER — IRON SUCROSE 20 MG/ML IV SOLN
200.0000 mg | Freq: Once | INTRAVENOUS | Status: AC
  Administered 2018-07-12: 200 mg via INTRAVENOUS
  Filled 2018-07-12: qty 10

## 2018-07-12 MED ORDER — SODIUM CHLORIDE 0.9 % IV SOLN
Freq: Once | INTRAVENOUS | Status: AC
  Administered 2018-07-12: 10:00:00 via INTRAVENOUS
  Filled 2018-07-12: qty 250

## 2018-07-12 MED ORDER — SODIUM CHLORIDE 0.9 % IV SOLN
200.0000 mg | Freq: Once | INTRAVENOUS | Status: AC
  Administered 2018-07-12: 200 mg via INTRAVENOUS
  Filled 2018-07-12: qty 8

## 2018-07-12 NOTE — Assessment & Plan Note (Addendum)
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  Nov 2019- PET shows- right ureteral uptake; and also shows in axilla/abdomen/pelvic adenopathy- "suspicious for metastatic disease".  given the presence of abnormal lymph nodes on the CT scans- " presumed" metastatic disease.  Proceed with immunotherapy given MSI high status. # Currently, on keytruda.   # proceed with cycle #2. Labs today reviewed;  acceptable for treatment today. Will plan imaging after 3 cycles.   #Colon cancer stage ? III [T4N1] stage IV- see above.   # family History of cancer/MSI high status- ? Lynch sydnrome- awaiting GC evaluation.   # Iron deficiency anemia-hemoglobin 10; -recent colon cancer/urologic loss-continue IV iron today.   # Hepatitis C- Positive/HCV RNA positive.  Question slightly elevated LFTs.  Monitor closely  # DISPOSITION: # IV venofer/Keytruda today. # genetics referral Dx: ? Lynch syndrome # Follow up in 3 weeks/MD/IV venofer/keytruda-Dr.B

## 2018-07-12 NOTE — Progress Notes (Signed)
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       HISTORY OF PRESENTING ILLNESS: Patient is a poor historian.  Is alone. Chad [name] 57 y.o.  male above multiple medical problems-urothelial cancer/question advanced currently on Keytruda is here for follow-up.  Patient denies any blood in urine currently.  Chronic joint pains.  Continues with chronic shortness of breath chronic/chronic cough.  Chronic generalized weakness.   Review of Systems  Constitutional: Positive for malaise/fatigue. Negative for chills, diaphoresis, fever and weight loss.  HENT: Negative for nosebleeds and sore throat.   Eyes: Negative for double vision.  Respiratory: Positive for cough and shortness of breath. Negative for hemoptysis and wheezing.   Cardiovascular: Negative for chest pain, palpitations, orthopnea and leg swelling.  Gastrointestinal: Negative for  abdominal pain, blood in stool, constipation, diarrhea, heartburn, melena, nausea and vomiting.  Genitourinary: Negative for dysuria, frequency and urgency.  Musculoskeletal: Positive for back pain and joint pain.  Skin: Negative.  Negative for itching and rash.  Neurological: Positive for focal weakness. Negative for dizziness, tingling, weakness and headaches.       Chronic left-sided weakness upper than lower extremity.  Endo/Heme/Allergies: Does not bruise/bleed easily.  Psychiatric/Behavioral: Negative for depression. The patient is not nervous/anxious and does not have insomnia.      MEDICAL HISTORY:  Past Medical History:  Diagnosis Date  . Anxiety   . Cancer [facility])    prostate  . Depression   . Hyperlipidemia   . Nerve pain   . Stroke [facility])     SURGICAL HISTORY: Past Surgical History:  Procedure Laterality Date  . COLON SURGERY     En bloc extended right hemicolectomy 07/2017  . CYSTOSCOPY WITH STENT PLACEMENT Right 04/25/2018   Procedure: CYSTOSCOPY WITH STENT PLACEMENT;  Surgeon: Hollice Espy, MD;  Location: [facility] ORS;  Service: Urology;  Laterality: Right;  . tumor removed       SOCIAL HISTORY: Social History   Socioeconomic History  . Marital status: Single    Spouse name: Not on file  . Number of children: Not on file  . Years of education: Not on file  . Highest education level: Not on file  Occupational History  . Not on file  Social Needs  . Financial resource strain: Not on file  . Food insecurity:    Worry: Not on file    Inability: Not on file  .  Transportation needs:    Medical: Not on file    Non-medical: Not on file  Tobacco Use  . Smoking status: Current Every Day Smoker    Packs/day: 1.00    Types: Cigarettes  . Smokeless tobacco: Never Used  Substance and Sexual Activity  . Alcohol use: Never    Frequency: Never  . Drug use: Never  . Sexual activity: Not on file  Lifestyle  . Physical activity:    Days per week: Not on file     Minutes per session: Not on file  . Stress: Not on file  Relationships  . Social connections:    Talks on phone: Not on file    Gets together: Not on file    Attends religious service: Not on file    Active member of club or organization: Not on file    Attends meetings of clubs or organizations: Not on file    Relationship status: Not on file  . Intimate partner violence:    Fear of current or ex partner: Not on file    Emotionally abused: Not on file    Physically abused: Not on file    Forced sexual activity: Not on file  Other Topics Concern  . Not on file  Social History Narrative    used to live [place]; moved  To [place]- end of April 2019; in Nursing home; 1pp/day; quit alcohol. 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        Family- dad- prostate ca [at 36y]; brother- 38 died of prostate cancer; brother- 2- no cancers [[place]]; sonGerald [name] [[place]];Chad [name]-32y prostate ca [facility] [place]]; daughter- 80 [[place]]; another daughter 65 [[place]/addict]. will refer genetics counseling. Given MSI- abnormal; highly suspicious of Lynch syndrome.  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    FAMILY HISTORY: Family History  Problem Relation Age of Onset  . Prostate cancer Father     ALLERGIES:  is allergic to penicillins.  MEDICATIONS:  Current Outpatient Medications  Medication Sig Dispense Refill  . acetaminophen (TYLENOL) 325 MG tablet Take 2 tablets by mouth as needed.    Marland Kitchen atorvastatin (LIPITOR) 80 MG tablet Take 1 tablet (80 mg total) by mouth daily. 90 tablet 3  . escitalopram (LEXAPRO) 20 MG tablet Take 1 tablet (20 mg total) by mouth daily. 90 tablet 3  . gabapentin (NEURONTIN) 100 MG capsule Take one to 2 tabs for pain 3 x day (Patient taking differently: Take 100 mg by mouth 3 (three) times daily. ) 180 capsule 3  . pantoprazole (PROTONIX) 40 MG tablet Take 40 mg by mouth daily.    . QUEtiapine (SEROQUEL) 50 MG tablet Take 1 tablet (50 mg total) by mouth at bedtime. 90 tablet  3   No current facility-administered medications for this visit.       Marland Kitchen  PHYSICAL EXAMINATION: ECOG PERFORMANCE STATUS: 1 - Symptomatic but completely ambulatory  Vitals:   07/12/18 0936  BP: 106/69  Pulse: 90  Temp: 98.3 F (36.8 C)   There were no vitals filed for this visit.  Physical Exam  Constitutional: He is oriented to person, place, and time.  In wheelchair. Cachectic.  Appears older than his stated age.  Alone.  HENT:  Head: Normocephalic and atraumatic.  Mouth/Throat: Oropharynx is clear and moist. No oropharyngeal exudate.  Eyes: Pupils are equal, round, and reactive to light.  Neck: Normal range of motion. Neck supple.  Cardiovascular: Normal rate and regular rhythm.  Pulmonary/Chest: No respiratory distress.  He has no wheezes.  Abdominal: Soft. Bowel sounds are normal. He exhibits no distension and no mass. There is no abdominal tenderness. There is no rebound and no guarding.  Musculoskeletal: Normal range of motion.        General: No tenderness or edema.  Neurological: He is alert and oriented to person, place, and time.  Weakness left upper and lower extremity.  Skin: Skin is warm.  Psychiatric: Affect normal.     LABORATORY DATA:  I have reviewed the data as listed Lab Results  Component Value Date   WBC 6.5 07/12/2018   HGB 10.1 (L) 07/12/2018   HCT 32.4 (L) 07/12/2018   MCV 75.5 (L) 07/12/2018   PLT 274 07/12/2018   Recent Labs    06/14/18 1253 06/21/18 1125 07/12/18 0856  NA 136 137 133*  K 3.8 3.8 4.0  CL 105 106 108  CO2 22 23 21*  GLUCOSE 120* 94 146*  BUN _0 CREATININE 1.08 1.20 0.99  CALCIUM 8.8* 8.7* 8.4*  GFRNONAA >60 >60 >60  GFRAA >60 >60 >60  PROT 7.6 7.4 7.0  ALBUMIN 3.5 3.6 3.0*  AST 65* 79* 77*  ALT 45* 53* 91*  ALKPHOS 106 101 136*  BILITOT 0.5 0.7 0.7    RADIOGRAPHIC STUDIES: I have personally reviewed the radiological images as listed and agreed with the findings in the report. No results  found.  ASSESSMENT & PLAN:   Ureteral cancer, right ([place]) # High-grade urothelial cancer/cytology; likely of the right renal pelvis /upper ureter.  Nov 2019- PET shows- right ureteral uptake; and also shows in axilla/abdomen/pelvic adenopathy- "suspicious for metastatic disease".  given the presence of abnormal lymph nodes on the CT scans- " presumed" metastatic disease.  Proceed with immunotherapy given MSI high status. # Currently, on keytruda.   # proceed with cycle #2. Labs today reviewed;  acceptable for treatment today. Will plan imaging after 3 cycles.   #Colon cancer stage ? III [T4N1] stage IV- see above.   # family History of cancer/MSI high status- ? Lynch sydnrome- awaiting GC evaluation.   # Iron deficiency anemia-hemoglobin 10; -recent colon cancer/urologic loss-continue IV iron today.   # Hepatitis C- Positive/HCV RNA positive.  Question slightly elevated LFTs.  Monitor closely  # DISPOSITION: # IV venofer/Keytruda today. # genetics referral Dx: ? Lynch syndrome # Follow up in 3 weeks/MD/IV venofer/keytruda-Dr.B  All questions were answered. The patient knows to call the clinic with any problems, questions or concerns.    Cammie Sickle, MD 07/12/2018 8:28 PM

## 2018-07-14 ENCOUNTER — Encounter: Payer: Self-pay | Admitting: Hospice and Palliative Medicine

## 2018-07-14 ENCOUNTER — Other Ambulatory Visit: Payer: Self-pay | Admitting: *Deleted

## 2018-07-14 ENCOUNTER — Inpatient Hospital Stay (HOSPITAL_BASED_OUTPATIENT_CLINIC_OR_DEPARTMENT_OTHER): Payer: Medicaid Other | Admitting: Hospice and Palliative Medicine

## 2018-07-14 VITALS — BP 131/84 | HR 81 | Temp 98.3°F | Resp 18

## 2018-07-14 DIAGNOSIS — C189 Malignant neoplasm of colon, unspecified: Secondary | ICD-10-CM

## 2018-07-14 DIAGNOSIS — Z515 Encounter for palliative care: Secondary | ICD-10-CM | POA: Diagnosis not present

## 2018-07-14 DIAGNOSIS — Z7189 Other specified counseling: Secondary | ICD-10-CM

## 2018-07-14 DIAGNOSIS — G893 Neoplasm related pain (acute) (chronic): Secondary | ICD-10-CM

## 2018-07-14 DIAGNOSIS — Z5112 Encounter for antineoplastic immunotherapy: Secondary | ICD-10-CM | POA: Diagnosis not present

## 2018-07-14 DIAGNOSIS — Z9049 Acquired absence of other specified parts of digestive tract: Secondary | ICD-10-CM

## 2018-07-14 DIAGNOSIS — C661 Malignant neoplasm of right ureter: Secondary | ICD-10-CM | POA: Diagnosis not present

## 2018-07-14 DIAGNOSIS — F418 Other specified anxiety disorders: Secondary | ICD-10-CM

## 2018-07-14 DIAGNOSIS — Z79899 Other long term (current) drug therapy: Secondary | ICD-10-CM

## 2018-07-14 DIAGNOSIS — R531 Weakness: Secondary | ICD-10-CM

## 2018-07-14 DIAGNOSIS — G8929 Other chronic pain: Secondary | ICD-10-CM

## 2018-07-14 DIAGNOSIS — B1920 Unspecified viral hepatitis C without hepatic coma: Secondary | ICD-10-CM

## 2018-07-14 DIAGNOSIS — M25562 Pain in left knee: Secondary | ICD-10-CM

## 2018-07-14 DIAGNOSIS — I69354 Hemiplegia and hemiparesis following cerebral infarction affecting left non-dominant side: Secondary | ICD-10-CM

## 2018-07-14 MED ORDER — HYDROCODONE-ACETAMINOPHEN 5-325 MG PO TABS: 1.0000 | ORAL_TABLET | Freq: Four times a day (QID) | ORAL | 0 refills | Status: DC | PRN

## 2018-07-14 NOTE — Progress Notes (Signed)
[place]  Telephone:([phone] [phone]   Name: Chad [name] Date: 07/14/2018 MRN: [id number]  DOB: July 01, 1961  Patient Care Team: [name], MD as PCP - General (Internal Medicine)    REASON FOR CONSULTATION: Palliative Care consult requested for this 57 y.o. 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 Patient was hospitalized 04/25/18 to 05/07/18 with multiorgan failure, VDRF, and sepsis from UTI. He was found to have R. Sided hydronephrosis requiring stenting. Urine cytology revealed high-grade urothelial cancer. Palliative care was asked to follow to help address goals.    SOCIAL HISTORY:    Patient is divorced. He lived in [place] but moved here in 2019. Patient has a son, who lives in [place]. He has four other children in [facility] and [place].   ADVANCE DIRECTIVES:  unknown  CODE STATUS: Full code  PAST MEDICAL HISTORY: Past Medical History:  Diagnosis Date  . Anxiety   . Cancer [facility])    prostate  . Depression   . Hyperlipidemia   . Nerve pain   . Stroke [facility])     PAST SURGICAL HISTORY:  Past Surgical History:  Procedure Laterality Date  . COLON SURGERY     En bloc extended right hemicolectomy 07/2017  . CYSTOSCOPY WITH STENT PLACEMENT Right 04/25/2018   Procedure: CYSTOSCOPY WITH STENT PLACEMENT;  Surgeon: Hollice Espy, MD;  Location: [facility] ORS;  Service: Urology;  Laterality: Right;  . 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      ALLERGIES:  is allergic to penicillins.  MEDICATIONS:  Current Outpatient Medications  Medication Sig Dispense Refill  . acetaminophen (TYLENOL) 325 MG tablet Take 2 tablets by mouth as needed.    Marland Kitchen atorvastatin (LIPITOR) 80 MG tablet Take 1 tablet (80 mg total) by mouth daily. 90 tablet 3  . escitalopram (LEXAPRO) 20 MG tablet Take 1 tablet (20 mg total) by mouth daily. 90 tablet 3  . gabapentin (NEURONTIN) 100 MG capsule Take one to 2 tabs for pain 3 x day (Patient taking differently: Take 100 mg by mouth 3 (three) times daily. ) 180 capsule 3  . pantoprazole (PROTONIX) 40 MG tablet Take 40 mg by mouth daily.    . QUEtiapine (SEROQUEL) 50 MG tablet Take 1 tablet (50 mg total) by mouth at bedtime. 90 tablet 3  . HYDROcodone-acetaminophen ([place])  5-325 MG tablet Take 1 tablet by mouth every 6 (six) hours as needed for moderate pain. 45 tablet 0   No current facility-administered medications for this visit.     VITAL SIGNS: BP 131/84   Pulse 81   Temp 98.3 F (36.8 C) (Tympanic)   Resp 18   SpO2 100%  Filed Weights    Estimated body mass index is 26.06 kg/m as calculated from the following:   Height as of 06/21/18: 5\' 7"  (1.702 m).   Weight as of 07/02/18: 166 lb 6.4 oz (75.5 kg).  LABS: CBC:    Component Value Date/Time   WBC 6.5 07/12/2018 0856   HGB 10.1 (L)  07/12/2018 0856   HCT 32.4 (L) 07/12/2018 0856   PLT 274 07/12/2018 0856   MCV 75.5 (L) 07/12/2018 0856   NEUTROABS 5.1 07/12/2018 0856   LYMPHSABS 0.7 07/12/2018 0856   MONOABS 0.4 07/12/2018 0856   EOSABS 0.3 07/12/2018 0856   BASOSABS 0.0 07/12/2018 0856   Comprehensive Metabolic Panel:    Component Value Date/Time   NA 133 (L) 07/12/2018 0856   K 4.0 07/12/2018 0856   CL 108 07/12/2018 0856   CO2 21 (L) 07/12/2018 0856   BUN 19 07/12/2018 0856   CREATININE 0.99 07/12/2018 0856   GLUCOSE 146 (H) 07/12/2018 0856   CALCIUM 8.4 (L) 07/12/2018 0856   AST 77 (H) 07/12/2018 0856   ALT 91 (H) 07/12/2018 0856   ALKPHOS 136 (H) 07/12/2018 0856   BILITOT 0.7 07/12/2018 0856   PROT 7.0 07/12/2018 0856   ALBUMIN 3.0 (L) 07/12/2018 0856    RADIOGRAPHIC STUDIES: No results found.  PERFORMANCE STATUS (ECOG) : 3 - Symptomatic, >50% confined to bed  Review of Systems As noted above. Otherwise, a complete review of systems is negative.  Physical Exam General: NAD, frail appearing, thin Cardiovascular: regular rate and rhythm Pulmonary: clear ant fields Abdomen: soft, nontender, + bowel sounds Extremities: no edema, L. Knee brace Skin: no rashes Neurological: L. Sided Weakness but otherwise nonfocal  IMPRESSION: Visit today in the clinic.  Patient reports that he is doing reasonably well.  Oral intake is reportedly good.  Patient says he is symptomatically well controlled.  He complains of chronic pain and swelling to L. Knee. He has worn a knee brace to that ext when previously seen. Patient says he has OA. He has been using Voltaren gel without good effect. Will write prescription for Norco prn.   We discussed ACP. He says he has completed these documents but they are not on file with [place]. Would benefit from completion at some point in the future. He says he would want his son to be his decision maker if needed.   I discussed his wishes for resuscitation. He says he would not want  CPR if he passed. However, he would be interested in short term intubation to see if he might improve. He would not want long term support from the ventilator. He does have a history of intubation.    I completed a MOST form today. The patient and family outlined their wishes for the following treatment decisions:  Cardiopulmonary Resuscitation: Do Not Attempt Resuscitation (DNR/No CPR)  Medical Interventions: Full Scope of Treatment: Use intubation, advanced airway interventions, mechanical ventilation, cardioversion as indicated, medical treatment, IV fluids, etc, also provide comfort measures. Transfer to the hospital if indicated  Antibiotics: Antibiotics if indicated  IV Fluids: IV fluids if indicated  Feeding Tube: No feeding tube    PLAN: Continue  treatment plan as outlined by oncology Norco 5-325mg  1 tablet Q6H prn for pain, #45, no refills MOST form completed as outlined above (no CPR, full scope of treatment) Amb referral to [facility] RTC in 2-3 weeks   Patient expressed understanding and was in agreement with this plan. He also understands that He can call clinic at any time with any questions, concerns, or complaints.    Time Total: 30 minutes  Visit consisted of counseling and education dealing with the complex and emotionally intense issues of symptom management and palliative care in the setting of serious and potentially life-threatening illness.Greater than 50%  of this time was spent counseling and coordinating care related to the above assessment and plan.  Signed by: Altha Harm, PhD, NP-C [phone] (Work Cell)

## 2018-08-02 ENCOUNTER — Inpatient Hospital Stay (HOSPITAL_BASED_OUTPATIENT_CLINIC_OR_DEPARTMENT_OTHER): Payer: Medicaid Other | Admitting: Internal Medicine

## 2018-08-02 ENCOUNTER — Inpatient Hospital Stay: Payer: Medicaid Other | Admitting: Hospice and Palliative Medicine

## 2018-08-02 ENCOUNTER — Other Ambulatory Visit: Payer: Self-pay

## 2018-08-02 ENCOUNTER — Inpatient Hospital Stay: Payer: Medicaid Other

## 2018-08-02 ENCOUNTER — Inpatient Hospital Stay: Payer: Medicaid Other | Attending: Internal Medicine

## 2018-08-02 VITALS — BP 102/66 | HR 80 | Temp 97.6°F | Resp 20 | Ht 67.0 in | Wt 152.0 lb

## 2018-08-02 DIAGNOSIS — R5383 Other fatigue: Secondary | ICD-10-CM | POA: Diagnosis not present

## 2018-08-02 DIAGNOSIS — Z9049 Acquired absence of other specified parts of digestive tract: Secondary | ICD-10-CM | POA: Diagnosis not present

## 2018-08-02 DIAGNOSIS — I69354 Hemiplegia and hemiparesis following cerebral infarction affecting left non-dominant side: Secondary | ICD-10-CM | POA: Diagnosis not present

## 2018-08-02 DIAGNOSIS — C661 Malignant neoplasm of right ureter: Secondary | ICD-10-CM

## 2018-08-02 DIAGNOSIS — B182 Chronic viral hepatitis C: Secondary | ICD-10-CM

## 2018-08-02 DIAGNOSIS — J449 Chronic obstructive pulmonary disease, unspecified: Secondary | ICD-10-CM | POA: Diagnosis not present

## 2018-08-02 DIAGNOSIS — Z79899 Other long term (current) drug therapy: Secondary | ICD-10-CM | POA: Insufficient documentation

## 2018-08-02 DIAGNOSIS — K219 Gastro-esophageal reflux disease without esophagitis: Secondary | ICD-10-CM | POA: Insufficient documentation

## 2018-08-02 DIAGNOSIS — D509 Iron deficiency anemia, unspecified: Secondary | ICD-10-CM | POA: Diagnosis not present

## 2018-08-02 DIAGNOSIS — Z9221 Personal history of antineoplastic chemotherapy: Secondary | ICD-10-CM

## 2018-08-02 DIAGNOSIS — R5381 Other malaise: Secondary | ICD-10-CM | POA: Insufficient documentation

## 2018-08-02 DIAGNOSIS — R600 Localized edema: Secondary | ICD-10-CM

## 2018-08-02 DIAGNOSIS — Z5112 Encounter for antineoplastic immunotherapy: Secondary | ICD-10-CM | POA: Diagnosis not present

## 2018-08-02 DIAGNOSIS — Z8042 Family history of malignant neoplasm of prostate: Secondary | ICD-10-CM | POA: Insufficient documentation

## 2018-08-02 DIAGNOSIS — Z993 Dependence on wheelchair: Secondary | ICD-10-CM | POA: Insufficient documentation

## 2018-08-02 DIAGNOSIS — I2510 Atherosclerotic heart disease of native coronary artery without angina pectoris: Secondary | ICD-10-CM | POA: Insufficient documentation

## 2018-08-02 DIAGNOSIS — C189 Malignant neoplasm of colon, unspecified: Secondary | ICD-10-CM | POA: Insufficient documentation

## 2018-08-02 DIAGNOSIS — E785 Hyperlipidemia, unspecified: Secondary | ICD-10-CM | POA: Diagnosis not present

## 2018-08-02 DIAGNOSIS — G8929 Other chronic pain: Secondary | ICD-10-CM | POA: Insufficient documentation

## 2018-08-02 DIAGNOSIS — M549 Dorsalgia, unspecified: Secondary | ICD-10-CM | POA: Diagnosis not present

## 2018-08-02 DIAGNOSIS — Z880 Allergy status to penicillin: Secondary | ICD-10-CM

## 2018-08-02 DIAGNOSIS — E8809 Other disorders of plasma-protein metabolism, not elsewhere classified: Secondary | ICD-10-CM

## 2018-08-02 DIAGNOSIS — M25562 Pain in left knee: Secondary | ICD-10-CM | POA: Diagnosis not present

## 2018-08-02 DIAGNOSIS — I700 Atherosclerosis of aorta: Secondary | ICD-10-CM | POA: Insufficient documentation

## 2018-08-02 DIAGNOSIS — R531 Weakness: Secondary | ICD-10-CM | POA: Insufficient documentation

## 2018-08-02 DIAGNOSIS — Z515 Encounter for palliative care: Secondary | ICD-10-CM

## 2018-08-02 DIAGNOSIS — F17210 Nicotine dependence, cigarettes, uncomplicated: Secondary | ICD-10-CM

## 2018-08-02 DIAGNOSIS — D500 Iron deficiency anemia secondary to blood loss (chronic): Secondary | ICD-10-CM

## 2018-08-02 LAB — COMPREHENSIVE METABOLIC PANEL
ALT: 23 U/L (ref 0–44)
AST: 45 U/L — AB (ref 15–41)
Albumin: 2.8 g/dL — ABNORMAL LOW (ref 3.5–5.0)
Alkaline Phosphatase: 92 U/L (ref 38–126)
Anion gap: 7 (ref 5–15)
BUN: 16 mg/dL (ref 6–20)
CO2: 25 mmol/L (ref 22–32)
CREATININE: 0.88 mg/dL (ref 0.61–1.24)
Calcium: 8.4 mg/dL — ABNORMAL LOW (ref 8.9–10.3)
Chloride: 102 mmol/L (ref 98–111)
GFR calc Af Amer: >60 mL/min (ref >60)
GFR calc non Af Amer: >60 mL/min (ref >60)
Glucose, Bld: 91 mg/dL (ref 70–99)
Potassium: 4.1 mmol/L (ref 3.5–5.1)
Sodium: 134 mmol/L — ABNORMAL LOW (ref 135–145)
Total Bilirubin: 0.8 mg/dL (ref 0.3–1.2)
Total Protein: 7.4 g/dL (ref 6.5–8.1)

## 2018-08-02 LAB — CBC WITH DIFFERENTIAL/PLATELET
ABS IMMATURE GRANULOCYTES: 0.02 10*3/uL (ref 0.00–0.07)
Basophils Absolute: 0.1 10*3/uL (ref 0.0–0.1)
Basophils Relative: 1 %
Eosinophils Absolute: 0.3 10*3/uL (ref 0.0–0.5)
Eosinophils Relative: 4 %
HCT: 37.5 % — ABNORMAL LOW (ref 39.0–52.0)
HEMOGLOBIN: 11.8 g/dL — AB (ref 13.0–17.0)
Immature Granulocytes: 0 %
Lymphocytes Relative: 12 %
Lymphs Abs: 0.8 10*3/uL (ref 0.7–4.0)
MCH: 24.8 pg — ABNORMAL LOW (ref 26.0–34.0)
MCHC: 31.5 g/dL (ref 30.0–36.0)
MCV: 78.9 fL — ABNORMAL LOW (ref 80.0–100.0)
Monocytes Absolute: 0.6 10*3/uL (ref 0.1–1.0)
Monocytes Relative: 9 %
Neutro Abs: 5 10*3/uL (ref 1.7–7.7)
Neutrophils Relative %: 74 %
Platelets: 269 10*3/uL (ref 150–400)
RBC: 4.75 MIL/uL (ref 4.22–5.81)
RDW: 22.4 % — ABNORMAL HIGH (ref 11.5–15.5)
WBC: 6.8 10*3/uL (ref 4.0–10.5)
nRBC: 0 % (ref 0.0–0.2)

## 2018-08-02 LAB — PROTEIN, URINE, RANDOM: Total Protein, Urine: 37 mg/dL

## 2018-08-02 MED ORDER — IRON SUCROSE 20 MG/ML IV SOLN
200.0000 mg | Freq: Once | INTRAVENOUS | Status: AC
  Administered 2018-08-02: 200 mg via INTRAVENOUS
  Filled 2018-08-02: qty 10

## 2018-08-02 MED ORDER — SODIUM CHLORIDE 0.9 % IV SOLN
Freq: Once | INTRAVENOUS | Status: AC
  Administered 2018-08-02: 11:00:00 via INTRAVENOUS
  Filled 2018-08-02: qty 250

## 2018-08-02 MED ORDER — SODIUM CHLORIDE 0.9 % IV SOLN
200.0000 mg | Freq: Once | INTRAVENOUS | Status: AC
  Administered 2018-08-02: 200 mg via INTRAVENOUS
  Filled 2018-08-02: qty 8

## 2018-08-02 NOTE — Progress Notes (Signed)
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       HISTORY OF PRESENTING ILLNESS: Patient is a poor historian.  Is alone. Chad [name] 58 y.o.  male above multiple medical problems-urothelial cancer/question advanced currently on Keytruda is here for follow-up.   Patient denies any blood in stool or urine.  Continues to have chronic back pain.  Chronic mild shortness of breath.  Chronic cough.  Generalized weakness.  Appetite is fair.  No significant weight loss.  Complains of leg swelling.  Review of Systems  Constitutional: Positive for malaise/fatigue. Negative for chills, diaphoresis, fever and weight loss.  HENT: Negative for nosebleeds and sore throat.   Eyes: Negative for double vision.  Respiratory: Positive for cough and shortness of breath. Negative for hemoptysis and wheezing.   Cardiovascular: Positive for leg  swelling. Negative for chest pain, palpitations and orthopnea.  Gastrointestinal: Negative for abdominal pain, blood in stool, constipation, diarrhea, heartburn, melena, nausea and vomiting.  Genitourinary: Negative for dysuria, frequency and urgency.  Musculoskeletal: Positive for back pain and joint pain.  Skin: Negative.  Negative for itching and rash.  Neurological: Positive for focal weakness. Negative for dizziness, tingling, weakness and headaches.       Chronic left-sided weakness upper than lower extremity.  Endo/Heme/Allergies: Does not bruise/bleed easily.  Psychiatric/Behavioral: Negative for depression. The patient is not nervous/anxious and does not have insomnia.      MEDICAL HISTORY:  Past Medical History:  Diagnosis Date  . Anxiety   . Cancer [facility])    prostate  . Depression   . Hyperlipidemia   . Nerve pain   . Stroke [facility])     SURGICAL HISTORY: Past Surgical History:  Procedure Laterality Date  . COLON SURGERY     En bloc extended right hemicolectomy 07/2017  . CYSTOSCOPY WITH STENT PLACEMENT Right 04/25/2018   Procedure: CYSTOSCOPY WITH STENT PLACEMENT;  Surgeon: Hollice Espy, MD;  Location: [facility] ORS;  Service: Urology;  Laterality: Right;  . tumor removed       SOCIAL HISTORY: Social History   Socioeconomic History  . Marital status: Single    Spouse name: Not on file  . Number of children: Not on file  . Years of education: Not on file  . Highest education level: Not on file  Occupational History  . Not on file  Social Needs  . Emergency planning/management officer  strain: Not on file  . Food insecurity:    Worry: Not on file    Inability: Not on file  . Transportation needs:    Medical: Not on file    Non-medical: Not on file  Tobacco Use  . Smoking status: Current Every Day Smoker    Packs/day: 1.00    Types: Cigarettes  . Smokeless tobacco: Never Used  Substance and Sexual Activity  . Alcohol use: Never    Frequency: Never  . Drug use: Never  .  Sexual activity: Not on file  Lifestyle  . Physical activity:    Days per week: Not on file    Minutes per session: Not on file  . Stress: Not on file  Relationships  . Social connections:    Talks on phone: Not on file    Gets together: Not on file    Attends religious service: Not on file    Active member of club or organization: Not on file    Attends meetings of clubs or organizations: Not on file    Relationship status: Not on file  . Intimate partner violence:    Fear of current or ex partner: Not on file    Emotionally abused: Not on file    Physically abused: Not on file    Forced sexual activity: Not on file  Other Topics Concern  . Not on file  Social History Narrative    used to live [place]; moved  To [place]- end of April 2019; in Nursing home; 1pp/day; quit alcohol. 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        Family- dad- prostate ca [at 8y]; brother- 13 died of prostate cancer; brother- 87- no cancers [[place]]; sonGerald Stabs [[place]];[name]-32y prostate ca [facility] [place]]; daughter- 76 [[place]]; another daughter 21 [[place]/addict]. will refer genetics counseling. Given MSI- abnormal; highly suspicious of Lynch syndrome.  Patient's son [name] aware of high possible lynch syndrome.    FAMILY HISTORY: Family History  Problem Relation Age of Onset  . Prostate cancer Father     ALLERGIES:  is allergic to penicillins.  MEDICATIONS:  Current Outpatient Medications  Medication Sig Dispense Refill  . escitalopram (LEXAPRO) 20 MG tablet Take 1 tablet (20 mg total) by mouth daily. 90 tablet 3  . gabapentin (NEURONTIN) 100 MG capsule Take one to 2 tabs for pain 3 x day (Patient taking differently: Take 100 mg by mouth 3 (three) times daily. ) 180 capsule 3  . HYDROcodone-acetaminophen (NORCO) 5-325 MG tablet Take 1 tablet by mouth every 6 (six) hours as needed for moderate pain. 45 tablet 0  . OxyCODONE HCl, Abuse Deter, (OXAYDO) 5 MG TABA Take 1 tablet by mouth every 12 (twelve) hours.    .  pantoprazole (PROTONIX) 40 MG tablet Take 40 mg by mouth daily.    . QUEtiapine (SEROQUEL) 50 MG tablet Take 1 tablet (50 mg total) by mouth at bedtime. 90 tablet 3  . acetaminophen (TYLENOL) 325 MG tablet Take 650 mg by mouth 3 (three) times daily as needed for mild pain.     No current facility-administered medications for this visit.       Marland Kitchen  PHYSICAL EXAMINATION: ECOG PERFORMANCE STATUS: 1 - Symptomatic but completely ambulatory  Vitals:   08/02/18 1014  BP: 102/66  Pulse: 80  Resp: 20  Temp: 97.6 F (36.4 C)   Filed Weights   08/02/18 1009  Weight: 152 lb (68.9 kg)    Physical Exam  Constitutional: He is  oriented to person, place, and time.  In wheelchair. Cachectic.  Appears older than his stated age.  Alone.  HENT:  Head: Normocephalic and atraumatic.  Mouth/Throat: Oropharynx is clear and moist. No oropharyngeal exudate.  Eyes: Pupils are equal, round, and reactive to light.  Neck: Normal range of motion. Neck supple.  Cardiovascular: Normal rate and regular rhythm.  Pulmonary/Chest: No respiratory distress. He has no wheezes.  Abdominal: Soft. Bowel sounds are normal. He exhibits no distension and no mass. There is no abdominal tenderness. There is no rebound and no guarding.  Musculoskeletal: Normal range of motion.        General: No tenderness or edema.  Neurological: He is alert and oriented to person, place, and time.  Weakness left upper and lower extremity.  Skin: Skin is warm.  Psychiatric: Affect normal.     LABORATORY DATA:  I have reviewed the data as listed Lab Results  Component Value Date   WBC 6.8 08/02/2018   HGB 11.8 (L) 08/02/2018   HCT 37.5 (L) 08/02/2018   MCV 78.9 (L) 08/02/2018   PLT 269 08/02/2018   Recent Labs    06/21/18 1125 07/12/18 0856 08/02/18 0938  NA 137 133* 134*  K 3.8 4.0 4.1  CL 106 108 102  CO2 23 21* 25  GLUCOSE 94 146* 91  BUN _0 CREATININE 1.20 0.99 0.88  CALCIUM 8.7* 8.4* 8.4*  GFRNONAA >60  >60 >60  GFRAA >60 >60 >60  PROT 7.4 7.0 7.4  ALBUMIN 3.6 3.0* 2.8*  AST 79* 77* 45*  ALT 53* 91* 23  ALKPHOS 101 136* 92  BILITOT 0.7 0.7 0.8    RADIOGRAPHIC STUDIES: I have personally reviewed the radiological images as listed and agreed with the findings in the report. No results found.  ASSESSMENT & PLAN:   Ureteral cancer, right ([place]) # High-grade urothelial cancer/cytology; likely of the right renal pelvis /upper ureter.  Nov 2019- PET shows- right ureteral uptake; and also shows in axilla/abdomen/pelvic adenopathy- "suspicious for metastatic disease".  given the presence of abnormal lymph nodes on the CT scans- " presumed" metastatic disease.  # Currently, on keytruda.   # proceed with cycle #3. Labs today reviewed;  acceptable for treatment today. Will plan imaging after 3 cycles; CT scan ordered today.  Discussed that possible operability based upon results of the PET scan overall general condition.  #Colon cancer stage ? III [T4N1] stage IV- see above.   # family History of cancer/MSI high status- ? Lynch sydnrome- awaiting GC evaluation.   # Hypoalbumineamia; Albumin 2.8; check urine protein today.   #Bilateral leg swelling grade 1-2-likely dependent/hypoalbuminemia.  Clinically no DVT.  # Iron deficiency anemia-hemoglobin 11--recent colon cancer/urologic loss-continue IV iron today.  STABLE.    # Hepatitis C- Positive/HCV RNA positive. slightly elevated LFTs. STABLE.  # DISPOSITION: urine protein # IV venofer/Keytruda today; u # genetics referral Dx: ? Lynch syndrome # Follow up in 3 weeks/MD/IV venofer/keytruda; CT scan prior-Dr.B  All questions were answered. The patient knows to call the clinic with any problems, questions or concerns.    Cammie Sickle, MD 08/03/2018 9:51 AM

## 2018-08-02 NOTE — Assessment & Plan Note (Addendum)
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  Nov 2019- PET shows- right ureteral uptake; and also shows in axilla/abdomen/pelvic adenopathy- "suspicious for metastatic disease".  given the presence of abnormal lymph nodes on the CT scans- " presumed" metastatic disease.  # Currently, on keytruda.   # proceed with cycle #3. Labs today reviewed;  acceptable for treatment today. Will plan imaging after 3 cycles; CT scan ordered today.  Discussed that possible operability based upon results of the PET scan overall general condition.  #Colon cancer stage ? III [T4N1] stage IV- see above.   # family History of cancer/MSI high status- ? Lynch sydnrome- awaiting GC evaluation.   # Hypoalbumineamia; Albumin 2.8; check urine protein today.   #Bilateral leg swelling grade 1-2-likely dependent/hypoalbuminemia.  Clinically no DVT.  # Iron deficiency anemia-hemoglobin 11--recent colon cancer/urologic loss-continue IV iron today.  STABLE.    # Hepatitis C- Positive/HCV RNA positive. slightly elevated LFTs. STABLE.  # DISPOSITION: urine protein # IV venofer/Keytruda today; u # genetics referral Dx: ? Lynch syndrome # Follow up in 3 weeks/MD/IV venofer/keytruda; CT scan prior-Dr.B

## 2018-08-06 ENCOUNTER — Telehealth: Payer: Self-pay | Admitting: Urology

## 2018-08-06 NOTE — Telephone Encounter (Signed)
-----   Message from Hollice Espy, MD sent at 08/06/2018 11:05 AM EST ----- Regarding: stent exchange This patient needs to be scheduled in the office to see me for discussion of ureteral stent exchange.Please arrange a follow-up in the near future.  Hollice Espy, MD

## 2018-08-06 NOTE — Telephone Encounter (Signed)
Left a message for the patient to cb to confirm an app for 08-12-18 @ 9:00 with Dr. Sinclair Grooms

## 2018-08-11 NOTE — Progress Notes (Signed)
08/12/2018 10:05 AM   [name] 03/23/1961 [id number]  Referring provider: [name], [place] [place], [place] [address]  Chief Complaint  Patient presents with  . Ureteral Cancer    discuss stent exchange   HPI: Chad [name] is a 58 yo M with a personal history of presumed metastatic urethelial carcinoma as well as a personal history of colon cancer returns to the office to discuss about ureteral stent exchange.   He reports of feeling well today and does not c/o of any bothersome urinary issues. He had an indwelling ureteral stent placed on 04/25/2018 on the right that needs to be exchanged.   He was unaware of his stent.  His UA today was positive for 0-5 WBC, 11-30 RBC and moderate bacteria.   His PET from 05/31/2018 showed mild to moderate increased uptake with bilateral axillary nodes, bilateral external iliac and inguinal lymph noes. Per the report, the most hyperintense metabolic lymph node is in the right axilla with an SUV of 6.8.  There is also asymmetric increased uptake within the right renal pelvis at the site of his known urothelial carcinoma as well as proximal ureter. He was started on Keytruda in November with palliative intent, followed by Dr. [name].    He lives in [place] and goes out for outings with his son who was present with him today.   PMH: Past Medical History:  Diagnosis Date  . Anxiety   . Cancer [facility])    prostate  . Depression   . Hyperlipidemia   . Nerve pain   . Stroke [facility])     Surgical History: Past Surgical History:  Procedure Laterality Date  . COLON SURGERY     En bloc extended right hemicolectomy 07/2017  . CYSTOSCOPY WITH STENT PLACEMENT Right 04/25/2018   Procedure: CYSTOSCOPY WITH STENT PLACEMENT;  Surgeon: Hollice Espy, MD;  Location: [facility] ORS;  Service: Urology;  Laterality: Right;  . tumor removed       Home Medications:  Allergies as of 08/12/2018      Reactions   Penicillins Rash        Medication List       Accurate as of August 12, 2018 10:05 AM. Always use your most recent med list.        acetaminophen 325 MG tablet Commonly known as:  TYLENOL Take 650 mg by mouth 3 (three) times daily as needed for mild pain.   escitalopram 20 MG tablet Commonly known as:  LEXAPRO Take 1 tablet (20 mg total) by mouth daily.   gabapentin 100 MG capsule Commonly known as:  NEURONTIN Take one to 2 tabs for pain 3 x day   HYDROcodone-acetaminophen 5-325 MG tablet Commonly known as:  NORCO Take 1 tablet by mouth every 6 (six) hours as needed for moderate pain.   OxyCODONE HCl (Abuse Deter) 5 MG Taba Commonly known as:  OXAYDO Take 1 tablet by mouth every 12 (twelve) hours.   pantoprazole 40 MG tablet Commonly known as:  PROTONIX Take 40 mg by mouth daily.   QUEtiapine 50 MG tablet Commonly known as:  SEROQUEL Take 1 tablet (50 mg total) by mouth at bedtime.       Allergies:  Allergies  Allergen Reactions  . Penicillins Rash    Family History: Family History  Problem Relation Age of Onset  . Prostate cancer Father     Social History:  reports that he has been smoking cigarettes. He has been smoking about 1.00  pack per day. He has never used smokeless tobacco. He reports that he does not drink alcohol or use drugs.  ROS: UROLOGY Frequent Urination?: Yes Hard to postpone urination?: Yes Burning/pain with urination?: No Get up at night to urinate?: Yes Leakage of urine?: No Urine stream starts and stops?: No Trouble starting stream?: No Do you have to strain to urinate?: No Blood in urine?: No Urinary tract infection?: No Sexually transmitted disease?: No Injury to kidneys or bladder?: No Painful intercourse?: No Weak stream?: No Erection problems?: No Penile pain?: No  Gastrointestinal Nausea?: No Vomiting?: No Indigestion/heartburn?: No Diarrhea?: No Constipation?: No  Constitutional Fever: No Night sweats?: No Weight loss?:  No Fatigue?: No  Skin Skin rash/lesions?: No Itching?: No  Eyes Blurred vision?: No Double vision?: No  Ears/Nose/Throat Sore throat?: No Sinus problems?: No  Hematologic/Lymphatic Swollen glands?: No Easy bruising?: No  Cardiovascular Leg swelling?: Yes Chest pain?: No  Respiratory Cough?: No Shortness of breath?: No  Endocrine Excessive thirst?: No  Musculoskeletal Back pain?: No Joint pain?: Yes  Neurological Headaches?: No Dizziness?: No  Psychologic Depression?: No Anxiety?: No  Physical Exam: BP (!) 89/54 (BP Location: Right Arm, Patient Position: Sitting)   Pulse 90   Ht 5\' 7"  (1.702 m)   Wt 152 lb (68.9 kg)   BMI 23.81 kg/m   Constitutional:  Alert and oriented, No acute distress. Disheveled.  Poor dentition.  In wheel chair. HEENT: [place] AT, moist mucus membranes.  Trachea midline, no masses. Cardiovascular: No clubbing, cyanosis, or edema. Respiratory: Normal respiratory effort, no increased work of breathing. Skin: No rashes, bruises or suspicious lesions. Neurologic: Grossly intact, no focal deficits, moving all 4 extremities. Psychiatric: Normal mood and affect.  Laboratory Data: Cr 0.88 08/02/2018  Urinalysis His UA was positive for 0-5 WBC, 11-30 RBC and moderate bacteria.   Assessment & Plan:    1. Urothelial cancer ([place]) Right high-grade upper tract urothelial carcinoma Continue immunotherapy by Dr. Hortense Ramal in November  F/u CT Abdomen Pelvis w Contrast on 08/19/2018  2. Indwelling Stent  Stent exchanged to be scheduled as below  3. Right Hydronephrosis  Recommend stent exchange vs. Removal Plan to proceed to right retrograde/ stent exchange depending on interoperative findings; risk of of stent irritation and bleeding were discussed Pre-op urine and urine culture today    [facility] [address], [place], [place] [address] [phone]  I,  Lucas Mallow, am acting as a scribe for Dr. Hollice Espy,  I have reviewed the above documentation for accuracy and completeness, and I agree with the above.   Hollice Espy, MD

## 2018-08-11 NOTE — H&P (View-Only) (Signed)
08/12/2018 10:05 AM   Chad [name] 06-24-1961 [id number]  Referring provider: [name], [place], [place] [address]  Chief Complaint  Patient presents with  . Ureteral Cancer    discuss stent exchange   HPI: Chad [name] is a 58 yo M with a personal history of presumed metastatic urethelial carcinoma as well as a personal history of colon cancer returns to the office to discuss about ureteral stent exchange.   He reports of feeling well today and does not c/o of any bothersome urinary issues. He had an indwelling ureteral stent placed on 04/25/2018 on the right that needs to be exchanged.   He was unaware of his stent.  His UA today was positive for 0-5 WBC, 11-30 RBC and moderate bacteria.   His PET from 05/31/2018 showed mild to moderate increased uptake with bilateral axillary nodes, bilateral external iliac and inguinal lymph noes. Per the report, the most hyperintense metabolic lymph node is in the right axilla with an SUV of 6.8.  There is also asymmetric increased uptake within the right renal pelvis at the site of his known urothelial carcinoma as well as proximal ureter. He was started on Keytruda in November with palliative intent, followed by Dr. [name].    He lives in [place] and goes out for outings with his son who was present with him today.   PMH: Past Medical History:  Diagnosis Date  . Anxiety   . Cancer [facility])    prostate  . Depression   . Hyperlipidemia   . Nerve pain   . Stroke [facility])     Surgical History: Past Surgical History:  Procedure Laterality Date  . COLON SURGERY     En bloc extended right hemicolectomy 07/2017  . CYSTOSCOPY WITH STENT PLACEMENT Right 04/25/2018   Procedure: CYSTOSCOPY WITH STENT PLACEMENT;  Surgeon: Hollice Espy, MD;  Location: [facility] ORS;  Service: Urology;  Laterality: Right;  . tumor removed       Home Medications:  Allergies as of 08/12/2018      Reactions   Penicillins Rash        Medication List       Accurate as of August 12, 2018 10:05 AM. Always use your most recent med list.        acetaminophen 325 MG tablet Commonly known as:  TYLENOL Take 650 mg by mouth 3 (three) times daily as needed for mild pain.   escitalopram 20 MG tablet Commonly known as:  LEXAPRO Take 1 tablet (20 mg total) by mouth daily.   gabapentin 100 MG capsule Commonly known as:  NEURONTIN Take one to 2 tabs for pain 3 x day   HYDROcodone-acetaminophen 5-325 MG tablet Commonly known as:  NORCO Take 1 tablet by mouth every 6 (six) hours as needed for moderate pain.   OxyCODONE HCl (Abuse Deter) 5 MG Taba Commonly known as:  OXAYDO Take 1 tablet by mouth every 12 (twelve) hours.   pantoprazole 40 MG tablet Commonly known as:  PROTONIX Take 40 mg by mouth daily.   QUEtiapine 50 MG tablet Commonly known as:  SEROQUEL Take 1 tablet (50 mg total) by mouth at bedtime.       Allergies:  Allergies  Allergen Reactions  . Penicillins Rash    Family History: Family History  Problem Relation Age of Onset  . Prostate cancer Father     Social History:  reports that he has been smoking cigarettes. He has been smoking about 1.00  pack per day. He has never used smokeless tobacco. He reports that he does not drink alcohol or use drugs.  ROS: UROLOGY Frequent Urination?: Yes Hard to postpone urination?: Yes Burning/pain with urination?: No Get up at night to urinate?: Yes Leakage of urine?: No Urine stream starts and stops?: No Trouble starting stream?: No Do you have to strain to urinate?: No Blood in urine?: No Urinary tract infection?: No Sexually transmitted disease?: No Injury to kidneys or bladder?: No Painful intercourse?: No Weak stream?: No Erection problems?: No Penile pain?: No  Gastrointestinal Nausea?: No Vomiting?: No Indigestion/heartburn?: No Diarrhea?: No Constipation?: No  Constitutional Fever: No Night sweats?: No Weight loss?:  No Fatigue?: No  Skin Skin rash/lesions?: No Itching?: No  Eyes Blurred vision?: No Double vision?: No  Ears/Nose/Throat Sore throat?: No Sinus problems?: No  Hematologic/Lymphatic Swollen glands?: No Easy bruising?: No  Cardiovascular Leg swelling?: Yes Chest pain?: No  Respiratory Cough?: No Shortness of breath?: No  Endocrine Excessive thirst?: No  Musculoskeletal Back pain?: No Joint pain?: Yes  Neurological Headaches?: No Dizziness?: No  Psychologic Depression?: No Anxiety?: No  Physical Exam: BP (!) 89/54 (BP Location: Right Arm, Patient Position: Sitting)   Pulse 90   Ht 5\' 7"  (1.702 m)   Wt 152 lb (68.9 kg)   BMI 23.81 kg/m   Constitutional:  Alert and oriented, No acute distress. Disheveled.  Poor dentition.  In wheel chair. HEENT: [place] AT, moist mucus membranes.  Trachea midline, no masses. Cardiovascular: No clubbing, cyanosis, or edema. Respiratory: Normal respiratory effort, no increased work of breathing. Skin: No rashes, bruises or suspicious lesions. Neurologic: Grossly intact, no focal deficits, moving all 4 extremities. Psychiatric: Normal mood and affect.  Laboratory Data: Cr 0.88 08/02/2018  Urinalysis His UA was positive for 0-5 WBC, 11-30 RBC and moderate bacteria.   Assessment & Plan:    1. Urothelial cancer ([place]) Right high-grade upper tract urothelial carcinoma Continue immunotherapy by Dr. Hortense Ramal in November  F/u CT Abdomen Pelvis w Contrast on 08/19/2018  2. Indwelling Stent  Stent exchanged to be scheduled as below  3. Right Hydronephrosis  Recommend stent exchange vs. Removal Plan to proceed to right retrograde/ stent exchange depending on interoperative findings; risk of of stent irritation and bleeding were discussed Pre-op urine and urine culture today    [facility] [address], [place], [place] [address] [phone]  I,  Lucas Mallow, am acting as a scribe for Dr. Hollice Espy,  I have reviewed the above documentation for accuracy and completeness, and I agree with the above.   Hollice Espy, MD

## 2018-08-12 ENCOUNTER — Telehealth: Payer: Self-pay | Admitting: Radiology

## 2018-08-12 ENCOUNTER — Other Ambulatory Visit: Payer: Self-pay | Admitting: Radiology

## 2018-08-12 ENCOUNTER — Encounter: Payer: Self-pay | Admitting: Urology

## 2018-08-12 ENCOUNTER — Ambulatory Visit (INDEPENDENT_AMBULATORY_CARE_PROVIDER_SITE_OTHER): Payer: Medicaid Other | Admitting: Urology

## 2018-08-12 VITALS — BP 89/54 | HR 90 | Ht 67.0 in | Wt 152.0 lb

## 2018-08-12 DIAGNOSIS — N1330 Unspecified hydronephrosis: Secondary | ICD-10-CM

## 2018-08-12 DIAGNOSIS — C689 Malignant neoplasm of urinary organ, unspecified: Secondary | ICD-10-CM

## 2018-08-12 DIAGNOSIS — Z960 Presence of urogenital implants: Secondary | ICD-10-CM | POA: Diagnosis not present

## 2018-08-12 LAB — URINALYSIS, COMPLETE
BILIRUBIN UA: NEGATIVE
Glucose, UA: NEGATIVE
Ketones, UA: NEGATIVE
Nitrite, UA: NEGATIVE
PH UA: 6 (ref 5.0–7.5)
Specific Gravity, UA: 1.02 (ref 1.005–1.030)
Urobilinogen, Ur: 0.2 mg/dL (ref 0.2–1.0)

## 2018-08-12 LAB — MICROSCOPIC EXAMINATION: EPITHELIAL CELLS (NON RENAL): NONE SEEN /HPF (ref 0–10)

## 2018-08-12 NOTE — Telephone Encounter (Signed)
Patient was given the [facility] Information form below as well as the Instructions for Pre-Admission Testing form & a map of [facility].   [place], [place],  [address] Telephone: [phone] Fax: [phone]   Thank you for choosing [facility] for your upcoming surgery!  We are always here to assist in your urological needs.  Please read the following information with specific details for your upcoming appointments related to your surgery. Please contact Kaliyan Osbourn at [phone] Option 3 with any questions.  The Name of Your Surgery: Cystoscopy, right retrograde pyelogram, right ureteral stent exchange Your Surgery Date: 08/30/2018 Your Surgeon: Hollice Espy  You will need to arrive on 08/30/2018 at 10:00 to Same Day Surgery which is located on the second floor of the [facility].   Please refer to the attached letter regarding instructions for Pre-Admission Testing. You will receive a call from the [place] office regarding your appointment with them.  The Pre-Admission Testing office is located at [address], on the first floor of the [place] [facility] in [place] (office is to the right as you enter through the [employer] of the [facility]). Please have all medications you are currently taking and your insurance card available.   Patient was advised to have nothing to eat or drink after midnight the night prior to surgery except that he may have only water until 2 hours before surgery with nothing to drink within 2 hours of surgery.  The patient states he currently takes no blood thinners. Patient's questions were answered and he expressed understanding of these instructions.

## 2018-08-13 NOTE — Telephone Encounter (Signed)
Discussed the [facility] for with Chad [name] at [facility] [phone]) & notified her of stent exchange scheduled 08/30/2018 with arrival to Same Day Surgery at 10:00 as well as pre-admission testing appointment scheduled 08/18/2018 at 10:15. Questions answered. Chad [name] expresses understanding of instructions.

## 2018-08-15 LAB — CULTURE, URINE COMPREHENSIVE

## 2018-08-16 ENCOUNTER — Non-Acute Institutional Stay: Payer: Medicaid Other | Admitting: Nurse Practitioner

## 2018-08-18 ENCOUNTER — Encounter
Admission: RE | Admit: 2018-08-18 | Discharge: 2018-08-18 | Disposition: A | Discharge status: Home / Self Care | Payer: Medicaid Other | Source: Ambulatory Visit | Attending: Urology | Admitting: Urology

## 2018-08-18 ENCOUNTER — Other Ambulatory Visit: Payer: Self-pay

## 2018-08-18 HISTORY — DX: Gastro-esophageal reflux disease without esophagitis: K21.9

## 2018-08-18 HISTORY — DX: Dysphagia, unspecified: R13.10

## 2018-08-18 HISTORY — DX: Malignant neoplasm of colon, unspecified: C18.9

## 2018-08-18 HISTORY — DX: Chronic obstructive pulmonary disease, unspecified: J44.9

## 2018-08-18 HISTORY — DX: Acute respiratory failure, unspecified whether with hypoxia or hypercapnia: J96.00

## 2018-08-18 HISTORY — DX: Inflammatory liver disease, unspecified: K75.9

## 2018-08-18 HISTORY — DX: Malignant neoplasm of bladder, unspecified: C67.9

## 2018-08-18 HISTORY — DX: Anemia, unspecified: D64.9

## 2018-08-18 NOTE — Patient Instructions (Signed)
Your procedure is scheduled on: Monday 08/30/2018 Report to [place]. To find out your arrival time please call [phone] between 1PM - 3PM on Friday 08/27/2018 .  Remember: Instructions that are not followed completely may result in serious medical risk, up to and including death, or upon the discretion of your surgeon and anesthesiologist your surgery may need to be rescheduled.     _X__ 1. Do not eat food after midnight the night before your procedure.                 No gum chewing or hard candies. You may drink clear liquids up to 2 hours                 before you are scheduled to arrive for your surgery- DO not drink clear                 liquids within 2 hours of the start of your surgery.                 Clear Liquids include:  water, apple juice without pulp, clear carbohydrate                 drink such as Clearfast or Gatorade, Black Coffee or Tea (Do not add                 anything to coffee or tea).  __X__2.  On the morning of surgery brush your teeth with toothpaste and water, you                 may rinse your mouth with mouthwash if you wish.  Do not swallow any              toothpaste of mouthwash.     _X__ 3.  No Alcohol for 24 hours before or after surgery.   _X__ 4.  Do Not Smoke or use e-cigarettes For 24 Hours Prior to Your Surgery.                 Do not use any chewable tobacco products for at least 6 hours prior to                 surgery.  ____  5.  Bring all medications with you on the day of surgery if instructed.   __X__  6.  Notify your doctor if there is any change in your medical condition      (cold, fever, infections).     Do not wear jewelry, make-up, hairpins, clips or nail polish. Do not wear lotions, powders, or perfumes.  Do not shave 48 hours prior to surgery. Men may shave face and neck. Do not bring valuables to the hospital.    [facility] is not responsible for any belongings or  valuables.  Contacts, dentures/partials or body piercings may not be worn into surgery. Bring a case for your contacts, glasses or hearing aids, a denture cup will be supplied. Leave your suitcase in the car. After surgery it may be brought to your room. For patients admitted to the hospital, discharge time is determined by your treatment team.   Patients discharged the day of surgery will not be allowed to drive home.   Please read over the following fact sheets that you were given:   MRSA Information  __X__ Take these medicines the morning of surgery with A SIP OF WATER:  1. escitalopram (LEXAPRO)  2. gabapentin (NEURONTIN)  3. pantoprazole (PROTONIX)   4. oxyCODONE (OXY IR/ROXICODONE) if needed and in pain  5.  6.  ____ Fleet Enema (as directed)   ____ Use CHG Soap/SAGE wipes as directed  ____ Use inhalers on the day of surgery  ____ Stop metformin/Janumet/Farxiga 2 days prior to surgery    ____ Take 1/2 of usual insulin dose the night before surgery. No insulin the morning          of surgery.   ____ Stop Blood Thinners Coumadin/Plavix/Xarelto/Pleta/Pradaxa/Eliquis/Effient/Aspirin  on   Or contact your Surgeon, Cardiologist or Medical Doctor regarding  ability to stop your blood thinners  __X__ Stop Anti-inflammatories 7 days before surgery such as Advil, Ibuprofen, Motrin,  BC or Goodies Powder, Naprosyn, Naproxen, Aleve, Aspirin    __X__ Stop all herbal supplements, fish oil or vitamin E until after surgery.    ____ Bring C-Pap to the hospital.

## 2018-08-19 ENCOUNTER — Ambulatory Visit
Admission: RE | Admit: 2018-08-19 | Discharge: 2018-08-19 | Disposition: A | Discharge status: Home / Self Care | Payer: Medicaid Other | Source: Ambulatory Visit | Attending: Internal Medicine | Admitting: Internal Medicine

## 2018-08-19 DIAGNOSIS — C661 Malignant neoplasm of right ureter: Secondary | ICD-10-CM | POA: Insufficient documentation

## 2018-08-19 MED ORDER — IOPAMIDOL (ISOVUE-300) INJECTION 61%
85.0000 mL | Freq: Once | INTRAVENOUS | Status: AC | PRN
  Administered 2018-08-19: 85 mL via INTRAVENOUS

## 2018-08-20 ENCOUNTER — Other Ambulatory Visit: Payer: Self-pay

## 2018-08-20 DIAGNOSIS — C689 Malignant neoplasm of urinary organ, unspecified: Secondary | ICD-10-CM

## 2018-08-23 ENCOUNTER — Encounter: Payer: Self-pay | Admitting: Oncology

## 2018-08-23 ENCOUNTER — Inpatient Hospital Stay (HOSPITAL_BASED_OUTPATIENT_CLINIC_OR_DEPARTMENT_OTHER): Payer: Medicaid Other | Admitting: Oncology

## 2018-08-23 ENCOUNTER — Inpatient Hospital Stay: Payer: Medicaid Other

## 2018-08-23 VITALS — BP 102/66 | HR 78 | Temp 98.5°F | Resp 18 | Wt 155.0 lb

## 2018-08-23 DIAGNOSIS — R531 Weakness: Secondary | ICD-10-CM

## 2018-08-23 DIAGNOSIS — I69354 Hemiplegia and hemiparesis following cerebral infarction affecting left non-dominant side: Secondary | ICD-10-CM

## 2018-08-23 DIAGNOSIS — R5381 Other malaise: Secondary | ICD-10-CM

## 2018-08-23 DIAGNOSIS — Z8042 Family history of malignant neoplasm of prostate: Secondary | ICD-10-CM

## 2018-08-23 DIAGNOSIS — J449 Chronic obstructive pulmonary disease, unspecified: Secondary | ICD-10-CM

## 2018-08-23 DIAGNOSIS — I700 Atherosclerosis of aorta: Secondary | ICD-10-CM

## 2018-08-23 DIAGNOSIS — C689 Malignant neoplasm of urinary organ, unspecified: Secondary | ICD-10-CM

## 2018-08-23 DIAGNOSIS — C189 Malignant neoplasm of colon, unspecified: Secondary | ICD-10-CM | POA: Diagnosis not present

## 2018-08-23 DIAGNOSIS — K219 Gastro-esophageal reflux disease without esophagitis: Secondary | ICD-10-CM

## 2018-08-23 DIAGNOSIS — D500 Iron deficiency anemia secondary to blood loss (chronic): Secondary | ICD-10-CM

## 2018-08-23 DIAGNOSIS — C661 Malignant neoplasm of right ureter: Secondary | ICD-10-CM

## 2018-08-23 DIAGNOSIS — E8809 Other disorders of plasma-protein metabolism, not elsewhere classified: Secondary | ICD-10-CM

## 2018-08-23 DIAGNOSIS — Z5112 Encounter for antineoplastic immunotherapy: Secondary | ICD-10-CM | POA: Diagnosis not present

## 2018-08-23 DIAGNOSIS — G8929 Other chronic pain: Secondary | ICD-10-CM

## 2018-08-23 DIAGNOSIS — Z9049 Acquired absence of other specified parts of digestive tract: Secondary | ICD-10-CM | POA: Diagnosis not present

## 2018-08-23 DIAGNOSIS — B182 Chronic viral hepatitis C: Secondary | ICD-10-CM

## 2018-08-23 DIAGNOSIS — I2510 Atherosclerotic heart disease of native coronary artery without angina pectoris: Secondary | ICD-10-CM

## 2018-08-23 DIAGNOSIS — Z9221 Personal history of antineoplastic chemotherapy: Secondary | ICD-10-CM

## 2018-08-23 DIAGNOSIS — F17210 Nicotine dependence, cigarettes, uncomplicated: Secondary | ICD-10-CM

## 2018-08-23 DIAGNOSIS — Z515 Encounter for palliative care: Secondary | ICD-10-CM

## 2018-08-23 DIAGNOSIS — Z880 Allergy status to penicillin: Secondary | ICD-10-CM

## 2018-08-23 DIAGNOSIS — M25562 Pain in left knee: Secondary | ICD-10-CM

## 2018-08-23 DIAGNOSIS — R600 Localized edema: Secondary | ICD-10-CM

## 2018-08-23 DIAGNOSIS — R5383 Other fatigue: Secondary | ICD-10-CM

## 2018-08-23 DIAGNOSIS — D509 Iron deficiency anemia, unspecified: Secondary | ICD-10-CM

## 2018-08-23 DIAGNOSIS — Z993 Dependence on wheelchair: Secondary | ICD-10-CM

## 2018-08-23 DIAGNOSIS — E785 Hyperlipidemia, unspecified: Secondary | ICD-10-CM

## 2018-08-23 DIAGNOSIS — M549 Dorsalgia, unspecified: Secondary | ICD-10-CM

## 2018-08-23 DIAGNOSIS — Z79899 Other long term (current) drug therapy: Secondary | ICD-10-CM

## 2018-08-23 LAB — CBC WITH DIFFERENTIAL/PLATELET
Abs Immature Granulocytes: 0.02 10*3/uL (ref 0.00–0.07)
Basophils Absolute: 0 10*3/uL (ref 0.0–0.1)
Basophils Relative: 1 %
Eosinophils Absolute: 0.4 10*3/uL (ref 0.0–0.5)
Eosinophils Relative: 8 %
HCT: 38.3 % — ABNORMAL LOW (ref 39.0–52.0)
Hemoglobin: 11.9 g/dL — ABNORMAL LOW (ref 13.0–17.0)
Immature Granulocytes: 0 %
Lymphocytes Relative: 10 %
Lymphs Abs: 0.6 10*3/uL — ABNORMAL LOW (ref 0.7–4.0)
MCH: 24.5 pg — ABNORMAL LOW (ref 26.0–34.0)
MCHC: 31.1 g/dL (ref 30.0–36.0)
MCV: 79 fL — ABNORMAL LOW (ref 80.0–100.0)
Monocytes Absolute: 0.4 10*3/uL (ref 0.1–1.0)
Monocytes Relative: 7 %
Neutro Abs: 4.3 10*3/uL (ref 1.7–7.7)
Neutrophils Relative %: 74 %
Platelets: 220 10*3/uL (ref 150–400)
RBC: 4.85 MIL/uL (ref 4.22–5.81)
RDW: 19.1 % — ABNORMAL HIGH (ref 11.5–15.5)
WBC: 5.7 10*3/uL (ref 4.0–10.5)
nRBC: 0 % (ref 0.0–0.2)

## 2018-08-23 LAB — URINALYSIS, COMPLETE (UACMP) WITH MICROSCOPIC
BILIRUBIN URINE: NEGATIVE
Bacteria, UA: NONE SEEN
Glucose, UA: NEGATIVE mg/dL
Ketones, ur: NEGATIVE mg/dL
Nitrite: NEGATIVE
Protein, ur: 100 mg/dL — AB
RBC / HPF: >50 RBC/hpf — ABNORMAL HIGH (ref 0–5)
Specific Gravity, Urine: 1.017 (ref 1.005–1.030)
pH: 5 (ref 5.0–8.0)

## 2018-08-23 LAB — COMPREHENSIVE METABOLIC PANEL
ALK PHOS: 76 U/L (ref 38–126)
ALT: 15 U/L (ref 0–44)
ANION GAP: 7 (ref 5–15)
AST: 30 U/L (ref 15–41)
Albumin: 2.7 g/dL — ABNORMAL LOW (ref 3.5–5.0)
BUN: 18 mg/dL (ref 6–20)
CO2: 22 mmol/L (ref 22–32)
Calcium: 8.4 mg/dL — ABNORMAL LOW (ref 8.9–10.3)
Chloride: 105 mmol/L (ref 98–111)
Creatinine, Ser: 0.99 mg/dL (ref 0.61–1.24)
GFR calc Af Amer: >60 mL/min (ref >60)
GFR calc non Af Amer: >60 mL/min (ref >60)
Glucose, Bld: 107 mg/dL — ABNORMAL HIGH (ref 70–99)
Potassium: 4.1 mmol/L (ref 3.5–5.1)
Sodium: 134 mmol/L — ABNORMAL LOW (ref 135–145)
Total Bilirubin: 0.5 mg/dL (ref 0.3–1.2)
Total Protein: 7.2 g/dL (ref 6.5–8.1)

## 2018-08-23 MED ORDER — SODIUM CHLORIDE 0.9 % IV SOLN
200.0000 mg | Freq: Once | INTRAVENOUS | Status: AC
  Administered 2018-08-23: 200 mg via INTRAVENOUS
  Filled 2018-08-23: qty 8

## 2018-08-23 MED ORDER — IRON SUCROSE 20 MG/ML IV SOLN
200.0000 mg | Freq: Once | INTRAVENOUS | Status: AC
  Administered 2018-08-23: 200 mg via INTRAVENOUS
  Filled 2018-08-23: qty 10

## 2018-08-23 MED ORDER — SODIUM CHLORIDE 0.9 % IV SOLN
Freq: Once | INTRAVENOUS | Status: AC
  Administered 2018-08-23: 11:00:00 via INTRAVENOUS
  Filled 2018-08-23: qty 250

## 2018-08-23 NOTE — Progress Notes (Signed)
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       HISTORY OF PRESENTING ILLNESS: Patient is a poor historian.  Is alone. Chad [name] 58 y.o.  male above multiple medical problems-urothelial cancer/question advanced currently on Keytruda is here for follow-up.  Today patient is doing well.  He denies any visible blood in his stool or urine.  Continues to complain of chronic back pain and left knee pain.  Is completely wheelchair-bound for the past 2 months d/t pain and generalized weakness.  Resides at nursing facility.  Has chronic mild shortness of breath, cough and generalized weakness.  Bilateral leg swelling grade 1-2. Appetite remains fair.  3 pound weight gain.   Review of Systems  Constitutional: Positive for malaise/fatigue. Negative for chills, fever and weight loss.  HENT: Negative for  congestion, ear pain and tinnitus.   Eyes: Negative.  Negative for blurred vision and double vision.  Respiratory: Positive for cough and shortness of breath. Negative for sputum production.   Cardiovascular: Positive for leg swelling. Negative for chest pain and palpitations.  Gastrointestinal: Negative.  Negative for abdominal pain, constipation, diarrhea, nausea and vomiting.  Genitourinary: Negative for dysuria, frequency and urgency.  Musculoskeletal: Positive for back pain, joint pain and myalgias. Negative for falls.       Left knee  Skin: Negative.  Negative for rash.  Neurological: Negative.  Negative for weakness and headaches.  Endo/Heme/Allergies: Negative.  Does not bruise/bleed easily.  Psychiatric/Behavioral: Negative.  Negative for depression. The patient is not nervous/anxious and does not have insomnia.      MEDICAL HISTORY:  Past Medical History:  Diagnosis Date  . Anemia   . Anxiety   . ARF (acute respiratory failure) ([place])   . Bladder cancer ([place])   . Cancer [facility])    prostate  . COPD (chronic obstructive pulmonary disease) ([place])   . Depression   . Dysphagia   . GERD (gastroesophageal reflux disease)   . Hepatitis    chronic hep c  . Hyperlipidemia   . Malignant neoplasm of colon ([place])   . Nerve pain   . Stroke [facility])     SURGICAL HISTORY: Past Surgical History:  Procedure Laterality Date  . COLON SURGERY     En bloc extended right hemicolectomy 07/2017  . CYSTOSCOPY WITH STENT PLACEMENT Right 04/25/2018   Procedure: CYSTOSCOPY WITH STENT  PLACEMENT;  Surgeon: Hollice Espy, MD;  Location: [facility] ORS;  Service: Urology;  Laterality: Right;  . tumor removed       SOCIAL HISTORY: Social History   Socioeconomic History  . Marital status: Single    Spouse name: Not on file  . Number of children: Not on file  . Years of education: Not on file  . Highest education level: Not on file  Occupational History  . Not on file  Social Needs  . Financial  resource strain: Not on file  . Food insecurity:    Worry: Not on file    Inability: Not on file  . Transportation needs:    Medical: Not on file    Non-medical: Not on file  Tobacco Use  . Smoking status: Current Every Day Smoker    Packs/day: 1.00    Types: Cigarettes  . Smokeless tobacco: Never Used  Substance and Sexual Activity  . Alcohol use: Not Currently    Frequency: Never  . Drug use: Not Currently  . Sexual activity: Not on file  Lifestyle  . Physical activity:    Days per week: Not on file    Minutes per session: Not on file  . Stress: Not on file  Relationships  . Social connections:    Talks on phone: Not on file    Gets together: Not on file    Attends religious service: Not on file    Active member of club or organization: Not on file    Attends meetings of clubs or organizations: Not on file    Relationship status: Not on file  . Intimate partner violence:    Fear of current or ex partner: Not on file    Emotionally abused: Not on file    Physically abused: Not on file    Forced sexual activity: Not on file  Other Topics Concern  . Not on file  Social History Narrative    used to live [place]; moved  To [place]- end of April 2019; in Nursing home; 1pp/day; quit alcohol. 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        Family- dad- prostate ca [at 64y]; brother- 33 died of prostate cancer; brother- 41- no cancers [[place]]; sonGerald Stabs [[place]];[name]-32y prostate ca [facility] [place]]; daughter- 22 [[place]]; another daughter 20 [[place]/addict]. will refer genetics counseling. Given MSI- abnormal; highly suspicious of Lynch syndrome.  Patient's son [name] aware of high possible lynch syndrome.    FAMILY HISTORY: Family History  Problem Relation Age of Onset  . Prostate cancer Father     ALLERGIES:  is allergic to penicillins.  MEDICATIONS:  Current Outpatient Medications  Medication Sig Dispense Refill  . acetaminophen (TYLENOL) 325 MG tablet Take 650 mg by mouth 3 (three)  times daily.     . diclofenac sodium (VOLTAREN) 1 % GEL Apply 2 g topically See admin instructions. Apply 2 g topically 4 times daily. Apply 2 g to left shoulder, apply 2 g to left hip and 2 g to left knee 4 times daily per [facility] instructions    . escitalopram (LEXAPRO) 20 MG tablet Take 1 tablet (20 mg total) by mouth daily. 90 tablet 3  . gabapentin (NEURONTIN) 100 MG capsule Take one to 2 tabs for pain 3 x day (Patient taking differently: Take 100 mg by mouth 3 (three) times daily. ) 180 capsule 3  . mirtazapine (REMERON) 7.5 MG tablet Take 7.5 mg by mouth at bedtime.    Marland Kitchen oxyCODONE (OXY  IR/ROXICODONE) 5 MG immediate release tablet Take 5 mg by mouth every 6 (six) hours.     . pantoprazole (PROTONIX) 40 MG tablet Take 40 mg by mouth daily.    . QUEtiapine (SEROQUEL) 25 MG tablet Take 12.5 mg by mouth at bedtime.     No current facility-administered medications for this visit.       Marland Kitchen  PHYSICAL EXAMINATION: ECOG PERFORMANCE STATUS: 1 - Symptomatic but completely ambulatory  Vitals:   08/23/18 1001  BP: 102/66  Pulse: 78  Resp: 18  Temp: 98.5 F (36.9 C)   Filed Weights   08/23/18 1001  Weight: 155 lb (70.3 kg)    Physical Exam  Constitutional: He is oriented to person, place, and time and well-developed, well-nourished, and in no distress. Vital signs are normal.  Wheelchair  HENT:  Head: Normocephalic and atraumatic.  Eyes: Pupils are equal, round, and reactive to light.  Neck: Normal range of motion.  Cardiovascular: Normal rate, regular rhythm and normal heart sounds.  No murmur heard. Pulmonary/Chest: Effort normal and breath sounds normal. He has no wheezes.  Abdominal: Soft. Normal appearance and bowel sounds are normal. He exhibits no distension. There is no abdominal tenderness.  Musculoskeletal: Normal range of motion.        General: No edema.     Right ankle: He exhibits swelling.     Left ankle: He exhibits swelling.  Neurological: He is alert and oriented to  person, place, and time. Gait normal.  Skin: Skin is warm and dry. No rash noted.  Psychiatric: Mood, memory, affect and judgment normal.     LABORATORY DATA:  I have reviewed the data as listed Lab Results  Component Value Date   WBC 5.7 08/23/2018   HGB 11.9 (L) 08/23/2018   HCT 38.3 (L) 08/23/2018   MCV 79.0 (L) 08/23/2018   PLT 220 08/23/2018   Recent Labs    07/12/18 0856 08/02/18 0938 08/23/18 0918  NA 133* 134* 134*  K 4.0 4.1 4.1  CL 108 102 105  CO2 21* 25 22  GLUCOSE 146* 91 107*  BUN _0 CREATININE 0.99 0.88 0.99  CALCIUM 8.4* 8.4* 8.4*  GFRNONAA >60 >60 >60  GFRAA >60 >60 >60  PROT 7.0 7.4 7.2  ALBUMIN 3.0* 2.8* 2.7*  AST 77* 45* 30  ALT 91* 23 15  ALKPHOS 136* 92 76  BILITOT 0.7 0.8 0.5    RADIOGRAPHIC STUDIES: I have personally reviewed the radiological images as listed and agreed with the findings in the report. Ct Chest W Contrast  Result Date: 08/19/2018 CLINICAL DATA:  Right ureteral cancer. Personal history of colon cancer. EXAM: CT CHEST, ABDOMEN, AND PELVIS WITH CONTRAST TECHNIQUE: Multidetector CT imaging of the chest, abdomen and pelvis was performed following the standard protocol during bolus administration of intravenous contrast. CONTRAST:  65m ISOVUE-300 IOPAMIDOL (ISOVUE-300) INJECTION 61% COMPARISON:  PET of 05/31/2018.  Prior diagnostic CTs of 05/07/2018. FINDINGS: CT CHEST FINDINGS Cardiovascular: Aortic and branch vessel atherosclerosis. Tortuous thoracic aorta. Normal heart size, without pericardial effusion. Lad coronary artery calcification. No central pulmonary embolism, on this non-dedicated study. Mediastinum/Nodes: No supraclavicular adenopathy. Index right axillary node measures 2.3 x 1.0 cm today versus similar on 05/31/2018. A left axillary node measures 10 mm on image 17/2 and is similar to on the prior PET. Left subpectoral adenopathy at 1.1 cm on image 12/2, increased from 8 mm on the prior PET (when remeasured). No  mediastinal or hilar adenopathy. Contrast level in  the esophagus on image 14/2. Lungs/Pleura: No pleural fluid. Probable secretions within the dependent endobronchial tree. Centrilobular and paraseptal emphysema. Two subpleural right upper lobe pulmonary nodules on the order of 2 mm are similar. The left upper lobe tree-in-bud nodularity has resolved. Musculoskeletal: No acute osseous abnormality. CT ABDOMEN PELVIS FINDINGS Hepatobiliary: Caudate lobe prominence and subtle atrophy of the medial segment left liver lobe. No focal liver lesion. Normal gallbladder, without biliary ductal dilatation. Pancreas: Normal, without mass or ductal dilatation. Spleen: Splenomegaly, including at 12.5 x 14.3 x 7.2 cm. This is increased. Adrenals/Urinary Tract: Normal adrenal glands. Interpolar 1.4 cm left renal cyst or minimally complex cyst. Mild right renal atrophy with a right-sided ureteric stent in place. Absent enhancement in the right renal pelvis on delayed images could be due to decreased renal function or infiltrative tumor. Small lower pole right renal collecting system calculi. No hydronephrosis. Normal urinary bladder. Stomach/Bowel: Normal stomach, without wall thickening. Right hemicolectomy. Normal small bowel. Vascular/Lymphatic: Advanced aortic and branch vessel atherosclerosis. Multiple small retroperitoneal nodes within the abdomen. None are pathologic by size criteria. A right common iliac 1.2 cm node on image 92/2 is increased from 7 mm on the prior PET (when remeasured). Right external iliac node measures 1.1 cm on image 108/2 and is similar to on the prior PET (when remeasured). More inferior and lateral right external iliac node measures 1.2 by 2.9 cm today versus 1.2 x 2.6 cm on the prior PET (when remeasured). Left external iliac node measures 1.4 cm today versus 1.3 cm on the prior PET. Reproductive: Normal prostate. Other: No significant free fluid. Musculoskeletal: Degenerate changes of both hips.  IMPRESSION: 1. Since the PET of 05/31/2018, mild progression of adenopathy within the chest and pelvis as detailed above. 2. Right ureteric stent in place, without hydronephrosis. 3. Progressive splenomegaly. Probable mild cirrhosis. Correlate with risk factors. 4. Resolved left upper lobe infection since 05/07/2018. Resolved bilateral pleural effusions. 5. Aortic atherosclerosis (ICD10-I70.0), coronary artery atherosclerosis and emphysema (ICD10-J43.9). 6. Esophageal air fluid level suggests dysmotility or gastroesophageal reflux. 7. Right nephrolithiasis. Electronically Signed   By: Abigail Miyamoto M.D.   On: 08/19/2018 12:35   Ct Abdomen Pelvis W Contrast  Result Date: 08/19/2018 CLINICAL DATA:  Right ureteral cancer. Personal history of colon cancer. EXAM: CT CHEST, ABDOMEN, AND PELVIS WITH CONTRAST TECHNIQUE: Multidetector CT imaging of the chest, abdomen and pelvis was performed following the standard protocol during bolus administration of intravenous contrast. CONTRAST:  56m ISOVUE-300 IOPAMIDOL (ISOVUE-300) INJECTION 61% COMPARISON:  PET of 05/31/2018.  Prior diagnostic CTs of 05/07/2018. FINDINGS: CT CHEST FINDINGS Cardiovascular: Aortic and branch vessel atherosclerosis. Tortuous thoracic aorta. Normal heart size, without pericardial effusion. Lad coronary artery calcification. No central pulmonary embolism, on this non-dedicated study. Mediastinum/Nodes: No supraclavicular adenopathy. Index right axillary node measures 2.3 x 1.0 cm today versus similar on 05/31/2018. A left axillary node measures 10 mm on image 17/2 and is similar to on the prior PET. Left subpectoral adenopathy at 1.1 cm on image 12/2, increased from 8 mm on the prior PET (when remeasured). No mediastinal or hilar adenopathy. Contrast level in the esophagus on image 14/2. Lungs/Pleura: No pleural fluid. Probable secretions within the dependent endobronchial tree. Centrilobular and paraseptal emphysema. Two subpleural right upper lobe  pulmonary nodules on the order of 2 mm are similar. The left upper lobe tree-in-bud nodularity has resolved. Musculoskeletal: No acute osseous abnormality. CT ABDOMEN PELVIS FINDINGS Hepatobiliary: Caudate lobe prominence and subtle atrophy of the medial segment left liver lobe.  No focal liver lesion. Normal gallbladder, without biliary ductal dilatation. Pancreas: Normal, without mass or ductal dilatation. Spleen: Splenomegaly, including at 12.5 x 14.3 x 7.2 cm. This is increased. Adrenals/Urinary Tract: Normal adrenal glands. Interpolar 1.4 cm left renal cyst or minimally complex cyst. Mild right renal atrophy with a right-sided ureteric stent in place. Absent enhancement in the right renal pelvis on delayed images could be due to decreased renal function or infiltrative tumor. Small lower pole right renal collecting system calculi. No hydronephrosis. Normal urinary bladder. Stomach/Bowel: Normal stomach, without wall thickening. Right hemicolectomy. Normal small bowel. Vascular/Lymphatic: Advanced aortic and branch vessel atherosclerosis. Multiple small retroperitoneal nodes within the abdomen. None are pathologic by size criteria. A right common iliac 1.2 cm node on image 92/2 is increased from 7 mm on the prior PET (when remeasured). Right external iliac node measures 1.1 cm on image 108/2 and is similar to on the prior PET (when remeasured). More inferior and lateral right external iliac node measures 1.2 by 2.9 cm today versus 1.2 x 2.6 cm on the prior PET (when remeasured). Left external iliac node measures 1.4 cm today versus 1.3 cm on the prior PET. Reproductive: Normal prostate. Other: No significant free fluid. Musculoskeletal: Degenerate changes of both hips. IMPRESSION: 1. Since the PET of 05/31/2018, mild progression of adenopathy within the chest and pelvis as detailed above. 2. Right ureteric stent in place, without hydronephrosis. 3. Progressive splenomegaly. Probable mild cirrhosis. Correlate with  risk factors. 4. Resolved left upper lobe infection since 05/07/2018. Resolved bilateral pleural effusions. 5. Aortic atherosclerosis (ICD10-I70.0), coronary artery atherosclerosis and emphysema (ICD10-J43.9). 6. Esophageal air fluid level suggests dysmotility or gastroesophageal reflux. 7. Right nephrolithiasis. Electronically Signed   By: Abigail Miyamoto M.D.   On: 08/19/2018 12:35    ASSESSMENT & PLAN:   Ureteral cancer, right ([place]) # High-grade urothelial cancer/cytology; likely of the right renal pelvis /upper ureter.  Nov 2019- PET shows- right ureteral uptake; and also shows in axilla/abdomen/pelvic adenopathy- "suspicious for metastatic disease".  given the presence of abnormal lymph nodes on the CT scans- " presumed" metastatic disease.  # Currently, on keytruda.   #Proceed with cycle #4.  Labs today reviewed; acceptable for treatment.  Recent imaging from 08/19/2018 revealed mild progression of adenopathy within the chest and pelvis.  Given only mild progression, okay to continue Keytruda.  #Colon cancer.  Stage IV see above.  #Family history of cancer/MSI high status-awaiting genetics evaluation.  #Hypoalbuminemia; albumin 2.7 today.  Urine revealed large amounts of blood, large amounts of leukocytes and protein 100. Will order random albumin urine to be collected at next visit.   #Bilateral leg swelling grade 1-2; likely dependent d/t hypoalbuminemia.  Clinically no evidence of DVT.  Encouraged him to wear TED hose and elevate  #Iron deficiency anemia-hemoglobin 11.9 today.  Improving.  Continue with IV iron today.   #Hepatitis C-positive/HCV RNA positive.  Normal LFT reading today.  Plan: IV Venofer/cycle 4 Keytruda today. Genetics referral. RTC in 3 weeks for labs/MD/Keytruda/IV Venofer    All questions were answered. The patient knows to call the clinic with any problems, questions or concerns.    Jacquelin Hawking, NP 08/23/2018 1:22 PM

## 2018-08-23 NOTE — Progress Notes (Signed)
Pt needs to be picked up by [facility] (pt may call it [facility]), call 20 minutes before (if possible) at [phone]

## 2018-08-23 NOTE — Assessment & Plan Note (Addendum)
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  Nov 2019- PET shows- right ureteral uptake; and also shows in axilla/abdomen/pelvic adenopathy- "suspicious for metastatic disease".  given the presence of abnormal lymph nodes on the CT scans- " presumed" metastatic disease.  # Currently, on keytruda.   #Proceed with cycle #4.  Labs today reviewed; acceptable for treatment.  Recent imaging from 08/19/2018 revealed mild progression of adenopathy within the chest and pelvis.  Given only mild progression, okay to continue Keytruda.  #Colon cancer.  Stage IV see above.  #Family history of cancer/MSI high status-awaiting genetics evaluation.  #Hypoalbuminemia; albumin 2.7 today.  Urine revealed large amounts of blood, large amounts of leukocytes and protein 100. Will order random albumin urine to be collected at next visit.   #Bilateral leg swelling grade 1-2; likely dependent d/t hypoalbuminemia.  Clinically no evidence of DVT.  Encouraged him to wear TED hose and elevate  #Iron deficiency anemia-hemoglobin 11.9 today.  Improving.  Continue with IV iron today.   #Hepatitis C-positive/HCV RNA positive.  Normal LFT reading today.  #Hematuria: Urinalysis revealed clear yellow urine with large amount present. d/t urothelial cancer.  CBC stable.  Continue to monitor.  Plan: IV Venofer/cycle 4 Keytruda today. Genetics referral. RTC in 3 weeks for labs/MD/Keytruda/IV Venofer

## 2018-08-30 ENCOUNTER — Ambulatory Visit: Payer: Medicaid Other | Admitting: Certified Registered"

## 2018-08-30 ENCOUNTER — Encounter: Payer: Self-pay | Admitting: *Deleted

## 2018-08-30 ENCOUNTER — Ambulatory Visit
Admission: RE | Admit: 2018-08-30 | Discharge: 2018-08-30 | Disposition: A | Discharge status: Home / Self Care | Payer: Medicaid Other | Attending: Urology | Admitting: Urology

## 2018-08-30 ENCOUNTER — Other Ambulatory Visit: Payer: Self-pay

## 2018-08-30 ENCOUNTER — Encounter
Admission: RE | Disposition: A | Discharge status: Home / Self Care | Payer: Self-pay | Source: Home / Self Care | Attending: Urology

## 2018-08-30 DIAGNOSIS — Z8554 Personal history of malignant neoplasm of ureter: Secondary | ICD-10-CM | POA: Diagnosis not present

## 2018-08-30 DIAGNOSIS — N1330 Unspecified hydronephrosis: Secondary | ICD-10-CM | POA: Insufficient documentation

## 2018-08-30 DIAGNOSIS — F17210 Nicotine dependence, cigarettes, uncomplicated: Secondary | ICD-10-CM | POA: Diagnosis not present

## 2018-08-30 DIAGNOSIS — C801 Malignant (primary) neoplasm, unspecified: Secondary | ICD-10-CM | POA: Insufficient documentation

## 2018-08-30 DIAGNOSIS — Z85038 Personal history of other malignant neoplasm of large intestine: Secondary | ICD-10-CM | POA: Diagnosis not present

## 2018-08-30 DIAGNOSIS — Z79891 Long term (current) use of opiate analgesic: Secondary | ICD-10-CM | POA: Diagnosis not present

## 2018-08-30 DIAGNOSIS — Z79899 Other long term (current) drug therapy: Secondary | ICD-10-CM | POA: Insufficient documentation

## 2018-08-30 DIAGNOSIS — Z466 Encounter for fitting and adjustment of urinary device: Secondary | ICD-10-CM | POA: Diagnosis not present

## 2018-08-30 DIAGNOSIS — F329 Major depressive disorder, single episode, unspecified: Secondary | ICD-10-CM | POA: Diagnosis not present

## 2018-08-30 DIAGNOSIS — F419 Anxiety disorder, unspecified: Secondary | ICD-10-CM | POA: Insufficient documentation

## 2018-08-30 DIAGNOSIS — Z8673 Personal history of transient ischemic attack (TIA), and cerebral infarction without residual deficits: Secondary | ICD-10-CM | POA: Diagnosis not present

## 2018-08-30 DIAGNOSIS — C689 Malignant neoplasm of urinary organ, unspecified: Secondary | ICD-10-CM

## 2018-08-30 HISTORY — PX: CYSTOSCOPY WITH STENT PLACEMENT: SHX5790

## 2018-08-30 HISTORY — PX: CYSTOSCOPY W/ RETROGRADES: SHX1426

## 2018-08-30 LAB — URINE DRUG SCREEN, QUALITATIVE (ARMC ONLY)
Amphetamines, Ur Screen: NOT DETECTED
Barbiturates, Ur Screen: NOT DETECTED
Benzodiazepine, Ur Scrn: NOT DETECTED
Cannabinoid 50 Ng, Ur ~~LOC~~: NOT DETECTED
Cocaine Metabolite,Ur ~~LOC~~: NOT DETECTED
MDMA (Ecstasy)Ur Screen: NOT DETECTED
Methadone Scn, Ur: NOT DETECTED
Opiate, Ur Screen: POSITIVE — AB
PHENCYCLIDINE (PCP) UR S: NOT DETECTED
Tricyclic, Ur Screen: POSITIVE — AB

## 2018-08-30 SURGERY — CYSTOSCOPY, WITH STENT INSERTION
Anesthesia: General | Laterality: Right

## 2018-08-30 MED ORDER — DEXAMETHASONE SODIUM PHOSPHATE 10 MG/ML IJ SOLN
INTRAMUSCULAR | Status: DC | PRN
  Administered 2018-08-30: 5 mg via INTRAVENOUS

## 2018-08-30 MED ORDER — PROPOFOL 10 MG/ML IV BOLUS
INTRAVENOUS | Status: AC
  Filled 2018-08-30: qty 20

## 2018-08-30 MED ORDER — PROPOFOL 10 MG/ML IV BOLUS
INTRAVENOUS | Status: DC | PRN
  Administered 2018-08-30: 80 mg via INTRAVENOUS
  Administered 2018-08-30: 120 mg via INTRAVENOUS

## 2018-08-30 MED ORDER — FENTANYL CITRATE (PF) 100 MCG/2ML IJ SOLN
INTRAMUSCULAR | Status: AC
  Filled 2018-08-30: qty 2

## 2018-08-30 MED ORDER — LIDOCAINE HCL (CARDIAC) PF 100 MG/5ML IV SOSY
PREFILLED_SYRINGE | INTRAVENOUS | Status: DC | PRN
  Administered 2018-08-30: 60 mg via INTRAVENOUS

## 2018-08-30 MED ORDER — LACTATED RINGERS IV SOLN
INTRAVENOUS | Status: DC
  Administered 2018-08-30: 11:00:00 via INTRAVENOUS

## 2018-08-30 MED ORDER — ONDANSETRON HCL 4 MG/2ML IJ SOLN
INTRAMUSCULAR | Status: DC | PRN
  Administered 2018-08-30: 4 mg via INTRAVENOUS

## 2018-08-30 MED ORDER — FENTANYL CITRATE (PF) 100 MCG/2ML IJ SOLN
INTRAMUSCULAR | Status: DC | PRN
  Administered 2018-08-30 (×2): 50 ug via INTRAVENOUS

## 2018-08-30 MED ORDER — FENTANYL CITRATE (PF) 100 MCG/2ML IJ SOLN: 25.0000 ug | INTRAMUSCULAR | Status: DC | PRN

## 2018-08-30 MED ORDER — ONDANSETRON HCL 4 MG/2ML IJ SOLN: 4.0000 mg | Freq: Once | INTRAMUSCULAR | Status: DC | PRN

## 2018-08-30 MED ORDER — IOTHALAMATE MEGLUMINE 43 % IV SOLN
INTRAVENOUS | Status: DC | PRN
  Administered 2018-08-30: 15 mL

## 2018-08-30 MED ORDER — GENTAMICIN SULFATE 40 MG/ML IJ SOLN
350.0000 mg | Freq: Once | INTRAVENOUS | Status: AC
  Administered 2018-08-30: 350 mg via INTRAVENOUS
  Filled 2018-08-30: qty 8.75

## 2018-08-30 SURGICAL SUPPLY — 23 items
BAG DRAIN CYSTO-URO LG1000N (MISCELLANEOUS) ×2 IMPLANT
BRUSH SCRUB EZ  4% CHG (MISCELLANEOUS) ×1
BRUSH SCRUB EZ 4% CHG (MISCELLANEOUS) ×1 IMPLANT
CATH URETL 5X70 OPEN END (CATHETERS) ×2 IMPLANT
CONRAY 43 FOR UROLOGY 50M (MISCELLANEOUS) ×2 IMPLANT
COVER WAND RF STERILE (DRAPES) ×2 IMPLANT
DRAPE UTILITY 15X26 TOWEL STRL (DRAPES) ×2 IMPLANT
GLOVE BIO SURGEON STRL SZ 6.5 (GLOVE) ×2 IMPLANT
GOWN STRL REUS W/ TWL LRG LVL3 (GOWN DISPOSABLE) ×2 IMPLANT
GOWN STRL REUS W/TWL LRG LVL3 (GOWN DISPOSABLE) ×2
GUIDEWIRE STR DUAL SENSOR (WIRE) ×2 IMPLANT
INTRODUCER DILATOR DOUBLE (INTRODUCER) ×2 IMPLANT
KIT TURNOVER CYSTO (KITS) ×2 IMPLANT
PACK CYSTO AR (MISCELLANEOUS) ×2 IMPLANT
SET CYSTO W/LG BORE CLAMP LF (SET/KITS/TRAYS/PACK) ×2 IMPLANT
SOL .9 NS 3000ML IRR  AL (IV SOLUTION) ×1
SOL .9 NS 3000ML IRR UROMATIC (IV SOLUTION) ×1 IMPLANT
STENT URET 6FRX24 CONTOUR (STENTS) IMPLANT
STENT URET 6FRX26 CONTOUR (STENTS) IMPLANT
STENT URO INLAY 6FRX26CM (STENTS) ×2 IMPLANT
SURGILUBE 2OZ TUBE FLIPTOP (MISCELLANEOUS) ×2 IMPLANT
SYRINGE IRR TOOMEY STRL 70CC (SYRINGE) ×2 IMPLANT
WATER STERILE IRR 1000ML POUR (IV SOLUTION) ×2 IMPLANT

## 2018-08-30 NOTE — Discharge Instructions (Signed)
You have a ureteral stent in place.  This is a tube that extends from your kidney to your bladder.  This may cause urinary bleeding, burning with urination, and urinary frequency.  Please call our office or present to the ED if you develop fevers >101 or pain which is not able to be controlled with oral pain medications.  You may be given either Flomax and/ or ditropan to help with bladder spasms and stent pain in addition to pain medications.   ° °[facility] °[address], [address] °[place], [place] [address] °[phone] ° ° ° °AMBULATORY SURGERY  °DISCHARGE INSTRUCTIONS ° ° °1) The drugs that you were given will stay in your system until tomorrow so for the next 24 hours you should not: ° °A) Drive an automobile °B) Make any legal decisions °C) Drink any alcoholic beverage ° ° °2) You may resume regular meals tomorrow.  Today it is better to start with liquids and gradually work up to solid foods. ° °You may eat anything you prefer, but it is better to start with liquids, then soup and crackers, and gradually work up to solid foods. ° ° °3) Please notify your doctor immediately if you have any unusual bleeding, trouble breathing, redness and pain at the surgery site, drainage, fever, or pain not relieved by medication. ° ° ° °4) Additional Instructions: ° ° ° ° ° ° ° °Please contact your physician with any problems or Same Day Surgery at [phone], Monday through Friday 6 am to 4 pm, or [facility] [place] Main number at [phone]. °

## 2018-08-30 NOTE — Transfer of Care (Signed)
Immediate Anesthesia Transfer of Care Note  Patient: Chad [name]  Procedure(s) Performed: CYSTOSCOPY WITH STENT Exchange (Right ) CYSTOSCOPY WITH RETROGRADE PYELOGRAM (Right )  Patient Location: PACU  Anesthesia Type:General  Level of Consciousness: awake  Airway & Oxygen Therapy: Patient Spontanous Breathing and Patient connected to face mask oxygen  Post-op Assessment: Report given to RN and Post -op Vital signs reviewed and stable  Post vital signs: Reviewed and stable  Last Vitals:  Vitals Value Taken Time  BP    Temp    Pulse    Resp    SpO2      Last Pain:  Vitals:   08/30/18 1021  TempSrc: Temporal  PainSc: 0-No pain         Complications: No apparent anesthesia complications

## 2018-08-30 NOTE — Anesthesia Procedure Notes (Signed)
Procedure Name: LMA Insertion Date/Time: 08/30/2018 11:35 AM Performed by: Chanetta Marshall, CRNA Pre-anesthesia Checklist: Patient identified, Emergency Drugs available, Suction available and Patient being monitored Patient Re-evaluated:Patient Re-evaluated prior to induction Oxygen Delivery Method: Circle system utilized Preoxygenation: Pre-oxygenation with 100% oxygen Induction Type: IV induction Ventilation: Mask ventilation without difficulty LMA: LMA inserted LMA Size: 4.0 Number of attempts: 1 Placement Confirmation: positive ETCO2,  breath sounds checked- equal and bilateral and CO2 detector Tube secured with: Tape Dental Injury: Teeth and Oropharynx as per pre-operative assessment

## 2018-08-30 NOTE — Anesthesia Post-op Follow-up Note (Signed)
Anesthesia QCDR form completed.        

## 2018-08-30 NOTE — Anesthesia Preprocedure Evaluation (Signed)
Anesthesia Evaluation  Patient identified by MRN, date of birth, ID band Patient awake    Reviewed: Allergy & Precautions, H&P , NPO status , Patient's Chart, lab work & pertinent test results, reviewed documented beta blocker date and time   Airway Mallampati: II  TM Distance: >3 FB Neck ROM: full    Dental  (+) Teeth Intact   Pulmonary COPD,  COPD inhaler, Current Smoker,    Pulmonary exam normal        Cardiovascular Exercise Tolerance: Poor negative cardio ROS Normal cardiovascular exam Rate:Normal     Neuro/Psych PSYCHIATRIC DISORDERS Anxiety Depression CVA, Residual Symptoms    GI/Hepatic GERD  Medicated,(+) Hepatitis -  Endo/Other  negative endocrine ROS  Renal/GU Renal disease  negative genitourinary   Musculoskeletal   Abdominal   Peds  Hematology  (+) Blood dyscrasia, anemia ,   Anesthesia Other Findings   Reproductive/Obstetrics negative OB ROS                             Anesthesia Physical Anesthesia Plan  ASA: III  Anesthesia Plan: General LMA   Post-op Pain Management:    Induction:   PONV Risk Score and Plan:   Airway Management Planned:   Additional Equipment:   Intra-op Plan:   Post-operative Plan:   Informed Consent: I have reviewed the patients History and Physical, chart, labs and discussed the procedure including the risks, benefits and alternatives for the proposed anesthesia with the patient or authorized representative who has indicated his/her understanding and acceptance.       Plan Discussed with: CRNA  Anesthesia Plan Comments:         Anesthesia Quick Evaluation

## 2018-08-30 NOTE — Interval H&P Note (Signed)
History and Physical Interval Note:  08/30/2018 11:17 AM  Chad [name]  has presented today for surgery, with the diagnosis of Right urothelial cancer  The various methods of treatment have been discussed with the patient and family. After consideration of risks, benefits and other options for treatment, the patient has consented to  Procedure(s): CYSTOSCOPY WITH STENT Exchange (Right) [name] (Right) as a surgical intervention .  The patient's history has been reviewed, patient examined, no change in status, stable for surgery.  I have reviewed the patient's chart and labs.  Questions were answered to the patient's satisfaction.     RRR CTAB  Hollice Espy

## 2018-08-30 NOTE — Op Note (Signed)
Date of procedure: 08/30/18  Preoperative diagnosis:  1. Right upper tract urothelial carcinoma 2. Right hydronephrosis  Postoperative diagnosis:  1. Same as above  Procedure: 1. Cystoscopy 2. Right retrograde pyelogram 3. Right ureteral stent exchange  Surgeon: Hollice Espy, MD  Anesthesia: General  Complications: None  Intraoperative findings: 2 cm filling defect involving renal pelvis and proximal right ureter consistent with known urothelial carcinoma of the right upper tract.  Stent exchange without difficulty.  Minimal to no encrustation.  EBL: Minimal  Specimens: None  Drains: 6 x 26 French double-J ureteral stent on right, Bard optima  Indication: Janie Strothman is a 58 y.o. patient with presumed metastatic urothelial carcinoma with obstructing tumor involving the right UPJ.  After reviewing the management options for treatment, he elected to proceed with the above surgical procedure(s). We have discussed the potential benefits and risks of the procedure, side effects of the proposed treatment, the likelihood of the patient achieving the goals of the procedure, and any potential problems that might occur during the procedure or recuperation. Informed consent has been obtained.  Description of procedure:  The patient was taken to the operating room and general anesthesia was induced.  The patient was placed in the dorsal lithotomy position, prepped and draped in the usual sterile fashion, and preoperative antibiotics were administered. A preoperative time-out was performed.   21 [place] scope was advanced per urethra into the bladder.  The bladder itself was carefully inspected and noted to be relatively normal without any evidence of tumor inflammation or infection.  The right UO was visualized with a stent emanating with a full coil which had minimal to no encrustation.  The distal coil the stent was grasped and brought to level the urethral meatus.  The stent was then  cannulated using a sensor wire up to level of the kidney.  A dual-lumen sheath was then used to the level of the mid ureter where Conray was injected through the second port.  This showed a normal appearing ureter but with a complex filling defect involving the renal pelvis extending into the right proximal ureter, measuring approximately 2 cm in the longest diameter.  There is some fullness of the collecting system without overt hydronephrosis but this filling defect was in a location where it clearly appeared to be obstructing.  Finally, the wire was backloaded over the rigid cystoscope.  A 6 x 26 French Bard optima ureteral stent was advanced over the wire up to level the kidney.  The wires partially drawn till focal is noted within the renal pelvis.  A full code was then noted within the bladder upon final removal.  The bladder was drained.  The patient was clean and dry, repositioned supine position, reversed anesthesia, taken the PACU in stable condition.  Plan: Patient return in 6 months to discuss his neck stent exchange.  Hollice Espy, M.D.

## 2018-08-31 ENCOUNTER — Encounter: Payer: Self-pay | Admitting: Urology

## 2018-09-03 NOTE — Anesthesia Postprocedure Evaluation (Signed)
Anesthesia Post Note  Patient: Win Guajardo  Procedure(s) Performed: CYSTOSCOPY WITH STENT Exchange (Right ) CYSTOSCOPY WITH RETROGRADE PYELOGRAM (Right )  Patient location during evaluation: PACU Anesthesia Type: General Level of consciousness: awake and alert Pain management: pain level controlled Vital Signs Assessment: post-procedure vital signs reviewed and stable Respiratory status: spontaneous breathing, nonlabored ventilation, respiratory function stable and patient connected to nasal cannula oxygen Cardiovascular status: blood pressure returned to baseline and stable Postop Assessment: no apparent nausea or vomiting Anesthetic complications: no     Last Vitals:  Vitals:   08/30/18 1257 08/30/18 1331  BP: 130/85 131/88  Pulse: 75 72  Resp: 18 16  Temp: 36.4 C 36.8 C  SpO2: 96% 98%    Last Pain:  Vitals:   08/30/18 1331  TempSrc: Temporal  PainSc: 0-No pain                 Molli Barrows

## 2018-09-08 ENCOUNTER — Encounter: Payer: Self-pay | Admitting: Nurse Practitioner

## 2018-09-08 ENCOUNTER — Non-Acute Institutional Stay: Payer: Medicaid Other | Admitting: Nurse Practitioner

## 2018-09-08 VITALS — BP 114/73 | HR 82 | Temp 97.7°F | Resp 18 | Wt 143.6 lb

## 2018-09-08 DIAGNOSIS — R531 Weakness: Secondary | ICD-10-CM

## 2018-09-08 DIAGNOSIS — G893 Neoplasm related pain (acute) (chronic): Secondary | ICD-10-CM

## 2018-09-08 DIAGNOSIS — R630 Anorexia: Secondary | ICD-10-CM | POA: Insufficient documentation

## 2018-09-08 DIAGNOSIS — Z515 Encounter for palliative care: Secondary | ICD-10-CM

## 2018-09-08 NOTE — Progress Notes (Signed)
Experiment Consult Note Telephone: [phone]  Fax: [phone]  PATIENT NAME: Chad [name] DOB: Feb 17, 1961 MRN: [id number]  PRIMARY CARE PROVIDER:   Dr Margarita Rana PROVIDER: Dr [facility] RESPONSIBLE PARTY:      RECOMMENDATIONS and PLAN:  1.Anorexia R63.0 secondary to protein calorie malnutrition related to cancer improving ongoing. Continue to monitor daily weights, supplements, supportive measures and courage to eat  2. Generalized weaknessR53.1  secondary to multiple chronic disease progression including anemia, late onset CVA, urothelial and colon cancer.  Encourage energy conservation and rest times.  3. Chronic pain secondary to neoplasm G89.3 monitor on pain scale, monitor efficacy vs adverse side effects. Currently taking oxycodone 5mg  q6hrs prn pain. Notified Dr Tamala Julian recommend add volaren gel or lidocaine patches for analgesic relief  4. Palliative care encounter Z51.5; Palliative medicine team will continue to support patient, patient's family, and medical team. Visit consisted of counseling and education dealing with the complex and emotionally intense issues of symptom management and palliative care in the setting of serious and potentially life-threatening illness   ASSESSMENT:     I visited and observed Chad [name]. We talked about purpose for palliative care visit. We talked about symptoms of pain which Chad [name] endorses is improved with current pain regimen. He shared that he is comfortable. Appetite remains poor. He has been experiencing more weakness and fatigue. We talked about continuing to go to the cancer center for his treatments. We talked about prognosis and his concerns. We talked about his son and family Dynamics. We talked about resigning and skilled [place] where he has have been having difficulty relying on others. We talked about increasing  socialization is recommended by Psychotherapy and Psychiatry. We talked about coping strategies. We talked about role of palliative care and plan of care. At present time he does remain a DNR but is continuing to seek aggressive treatment for his cancer. Therapeutic listening and emotional support provided. Continue to encourage him to eat, positive reinforcement. Questions answered the satisfaction. I did attempt to contact his son, message left for update on palliative care visit. No further changes at present time will continue with current pain regimen. I have updated nursing staff   I spent 60 minutes providing this consultation,  from 10:30am to 11:30am. More than 50% of the time in this consultation was spent coordinating communication.   HISTORY OF PRESENT ILLNESS:  Chad [name] is a 58 y.o. year old male with multiple medical problems includinglate onset CVA, urothelial cancer, prostate cancer, chronic hepatitis C, hyperlipidemia, colon surgery, protein calorie malnutrition, anxiety, depression. He was hospitalized 9/20 03/2017 for shortness of breath with confusion family found him outside calling EMS to find him hypothermic and respiratory distress, hypertensive with acidosis. Workout significant for septic shock secondary to UTI with right hydronephrosis s/p right ureteral stent with the cology reviewing high-grade urothelial carcinoma. He did require intubation during this hospitalization for respiratory failure and acute metabolic encephalopathy multifactorial with MRI showing small subdural hematomas appearing to be chronic with Neurology determining old CVA with left upper extremity and lower extremity weakness. Elevated lft secondary to septic shock in the setting of chronic hepatitis. Microcytic anemia with acute kidney injury secondary to septic shock which improved. Chronic dysphagia. COPD with tobacco abuse. Chad [name] continues to reside at skilled [place]. He  does transfer to the wheelchair and is able to perform some adl's with assistance. He does feed himself an  appetite has remained poor with ongoing weight loss. He continues to take Remeron for an appetite stimulants. He did have a fall onto / 07/2018 with no noted injury. He does continue to go to the cancer center for Infusion treatments. He continues to take Oxycodone 5 mg 6 hours as needed for pain with relief. He is required oxycodone 5 mg in the last 24 hours. She also takes Gabapentin 100 mg tid schedule for pain with Voltaren gel with effective relief. [facility] continues to be followed by Psychiatry at the facility with last date of service 1 / 16 / 2020 for follow up after initiation of Remeron. Escitalopram was maxed out and Seroquel was decreased which he was tolerating. He was having more withdrawal, depressive symptoms related to his cancer and prognosis. Psychotherapy continues to follow with counseling. Last Psychotherapy visit 2 / 6 / 2020 for depression, stress of being in long-term care facility, anxiety with use of psychiatric medications. Her documentation more symptoms of anxiety, low energy, Melancholy with poor concentration. 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 He has been having disruptions with his sleep pattern secondary to physical pain. Recommendation was to practice social skills in deliberately engage in three or more interpersonal relationships with others. Reduce isolation. Continue Psychotherapy and support. He is oriented and able to verbalize his needs. At present he is lying in bed. He appears thin but comfortable. No visitors present. Palliative Care was asked to help address goals of care.   CODE STATUS: DNR  PPS: 40% HOSPICE ELIGIBILITY/DIAGNOSIS: TBD  PAST MEDICAL HISTORY:  Past Medical History:  Diagnosis Date  . Anemia   . Anxiety   . ARF (acute respiratory failure) ([place])   . Bladder cancer ([place])   . Cancer [facility])    prostate  . COPD (chronic  obstructive pulmonary disease) ([place])   . Depression   . Dysphagia   . GERD (gastroesophageal reflux disease)   . Hepatitis    chronic hep c  . Hyperlipidemia   . Malignant neoplasm of colon ([place])   . Nerve pain   . Stroke [facility])     SOCIAL HX:  Social History   Tobacco Use  . Smoking status: Current Every Day Smoker    Packs/day: 1.00    Types: Cigarettes  . Smokeless tobacco: Never Used  Substance Use Topics  . Alcohol use: Not Currently    Frequency: Never    ALLERGIES:  Allergies  Allergen Reactions  . Penicillins Rash     PERTINENT MEDICATIONS:  Outpatient Encounter Medications as of 09/08/2018  Medication Sig  . acetaminophen (TYLENOL) 325 MG tablet Take 650 mg by mouth 3 (three) times daily.   . diclofenac sodium (VOLTAREN) 1 % GEL Apply 2 g topically See admin instructions. Apply 2 g topically 4 times daily. Apply 2 g to left shoulder, apply 2 g to left hip and 2 g to left knee 4 times daily per [facility] instructions  . escitalopram (LEXAPRO) 20 MG tablet Take 1 tablet (20 mg total) by mouth daily.  Marland Kitchen gabapentin (NEURONTIN) 100 MG capsule Take one to 2 tabs for pain 3 x day (Patient taking differently: Take 100 mg by mouth 3 (three) times daily. )  . mirtazapine (REMERON) 7.5 MG tablet Take 7.5 mg by mouth at bedtime.  Marland Kitchen oxyCODONE (OXY IR/ROXICODONE) 5 MG immediate release tablet Take 5 mg by mouth every 6 (six) hours.   . pantoprazole (PROTONIX) 40 MG tablet Take 40 mg by mouth daily.  Marland Kitchen  QUEtiapine (SEROQUEL) 25 MG tablet Take 12.5 mg by mouth at bedtime.   No facility-administered encounter medications on file as of 09/08/2018.     PHYSICAL EXAM:   General: NAD, frail appearing, thin male Cardiovascular: regular rate and rhythm Pulmonary: clear ant fields Abdomen: soft, nontender, + bowel sounds GU: no suprapubic tenderness Extremities: no edema, no joint deformities Skin: no rashes Neurological: Weakness but otherwise nonfocal  Kobe Ofallon Ihor Gully, NP

## 2018-09-10 ENCOUNTER — Other Ambulatory Visit: Payer: Self-pay | Admitting: Internal Medicine

## 2018-09-13 ENCOUNTER — Other Ambulatory Visit: Payer: Self-pay

## 2018-09-13 ENCOUNTER — Encounter: Payer: Self-pay | Admitting: Internal Medicine

## 2018-09-13 ENCOUNTER — Inpatient Hospital Stay: Payer: Medicaid Other

## 2018-09-13 ENCOUNTER — Inpatient Hospital Stay: Payer: Medicaid Other | Attending: Internal Medicine

## 2018-09-13 ENCOUNTER — Inpatient Hospital Stay (HOSPITAL_BASED_OUTPATIENT_CLINIC_OR_DEPARTMENT_OTHER): Payer: Medicaid Other | Admitting: Internal Medicine

## 2018-09-13 VITALS — BP 106/72 | HR 77 | Temp 96.0°F | Resp 18 | Ht 67.0 in | Wt 152.0 lb

## 2018-09-13 DIAGNOSIS — G8929 Other chronic pain: Secondary | ICD-10-CM | POA: Insufficient documentation

## 2018-09-13 DIAGNOSIS — M25562 Pain in left knee: Secondary | ICD-10-CM | POA: Diagnosis not present

## 2018-09-13 DIAGNOSIS — F329 Major depressive disorder, single episode, unspecified: Secondary | ICD-10-CM | POA: Insufficient documentation

## 2018-09-13 DIAGNOSIS — E785 Hyperlipidemia, unspecified: Secondary | ICD-10-CM | POA: Insufficient documentation

## 2018-09-13 DIAGNOSIS — J449 Chronic obstructive pulmonary disease, unspecified: Secondary | ICD-10-CM | POA: Diagnosis not present

## 2018-09-13 DIAGNOSIS — R161 Splenomegaly, not elsewhere classified: Secondary | ICD-10-CM | POA: Insufficient documentation

## 2018-09-13 DIAGNOSIS — R5381 Other malaise: Secondary | ICD-10-CM | POA: Diagnosis not present

## 2018-09-13 DIAGNOSIS — F17210 Nicotine dependence, cigarettes, uncomplicated: Secondary | ICD-10-CM | POA: Diagnosis not present

## 2018-09-13 DIAGNOSIS — R0602 Shortness of breath: Secondary | ICD-10-CM | POA: Diagnosis not present

## 2018-09-13 DIAGNOSIS — C661 Malignant neoplasm of right ureter: Secondary | ICD-10-CM

## 2018-09-13 DIAGNOSIS — Z79899 Other long term (current) drug therapy: Secondary | ICD-10-CM | POA: Diagnosis not present

## 2018-09-13 DIAGNOSIS — R531 Weakness: Secondary | ICD-10-CM | POA: Insufficient documentation

## 2018-09-13 DIAGNOSIS — Z5112 Encounter for antineoplastic immunotherapy: Secondary | ICD-10-CM | POA: Diagnosis not present

## 2018-09-13 DIAGNOSIS — Z8673 Personal history of transient ischemic attack (TIA), and cerebral infarction without residual deficits: Secondary | ICD-10-CM | POA: Insufficient documentation

## 2018-09-13 DIAGNOSIS — D509 Iron deficiency anemia, unspecified: Secondary | ICD-10-CM | POA: Diagnosis not present

## 2018-09-13 DIAGNOSIS — K7460 Unspecified cirrhosis of liver: Secondary | ICD-10-CM | POA: Diagnosis not present

## 2018-09-13 DIAGNOSIS — B182 Chronic viral hepatitis C: Secondary | ICD-10-CM | POA: Diagnosis not present

## 2018-09-13 DIAGNOSIS — G8928 Other chronic postprocedural pain: Secondary | ICD-10-CM

## 2018-09-13 DIAGNOSIS — K219 Gastro-esophageal reflux disease without esophagitis: Secondary | ICD-10-CM | POA: Diagnosis not present

## 2018-09-13 DIAGNOSIS — Z515 Encounter for palliative care: Secondary | ICD-10-CM

## 2018-09-13 DIAGNOSIS — Z85038 Personal history of other malignant neoplasm of large intestine: Secondary | ICD-10-CM | POA: Diagnosis not present

## 2018-09-13 LAB — COMPREHENSIVE METABOLIC PANEL
ALK PHOS: 99 U/L (ref 38–126)
ALT: 11 U/L (ref 0–44)
ANION GAP: 9 (ref 5–15)
AST: 24 U/L (ref 15–41)
Albumin: 3.4 g/dL — ABNORMAL LOW (ref 3.5–5.0)
BUN: 20 mg/dL (ref 6–20)
CALCIUM: 9 mg/dL (ref 8.9–10.3)
CO2: 20 mmol/L — ABNORMAL LOW (ref 22–32)
Chloride: 106 mmol/L (ref 98–111)
Creatinine, Ser: 1.09 mg/dL (ref 0.61–1.24)
GFR calc Af Amer: >60 mL/min (ref >60)
GFR calc non Af Amer: >60 mL/min (ref >60)
Glucose, Bld: 110 mg/dL — ABNORMAL HIGH (ref 70–99)
Potassium: 4.3 mmol/L (ref 3.5–5.1)
Sodium: 135 mmol/L (ref 135–145)
Total Bilirubin: 0.4 mg/dL (ref 0.3–1.2)
Total Protein: 8 g/dL (ref 6.5–8.1)

## 2018-09-13 LAB — CBC WITH DIFFERENTIAL/PLATELET
Abs Immature Granulocytes: 0.03 10*3/uL (ref 0.00–0.07)
BASOS ABS: 0.1 10*3/uL (ref 0.0–0.1)
Basophils Relative: 1 %
EOS ABS: 0.4 10*3/uL (ref 0.0–0.5)
Eosinophils Relative: 5 %
HCT: 41.4 % (ref 39.0–52.0)
Hemoglobin: 13.2 g/dL (ref 13.0–17.0)
Immature Granulocytes: 0 %
Lymphocytes Relative: 10 %
Lymphs Abs: 0.8 10*3/uL (ref 0.7–4.0)
MCH: 25.7 pg — ABNORMAL LOW (ref 26.0–34.0)
MCHC: 31.9 g/dL (ref 30.0–36.0)
MCV: 80.7 fL (ref 80.0–100.0)
Monocytes Absolute: 0.5 10*3/uL (ref 0.1–1.0)
Monocytes Relative: 6 %
NEUTROS PCT: 78 %
Neutro Abs: 6.3 10*3/uL (ref 1.7–7.7)
Platelets: 202 10*3/uL (ref 150–400)
RBC: 5.13 MIL/uL (ref 4.22–5.81)
RDW: 17 % — ABNORMAL HIGH (ref 11.5–15.5)
WBC: 8.1 10*3/uL (ref 4.0–10.5)
nRBC: 0 % (ref 0.0–0.2)

## 2018-09-13 LAB — PROTEIN, URINE, RANDOM: TOTAL PROTEIN, URINE: 111 mg/dL

## 2018-09-13 LAB — TSH: TSH: 2.603 u[IU]/mL (ref 0.350–4.500)

## 2018-09-13 MED ORDER — SODIUM CHLORIDE 0.9 % IV SOLN
Freq: Once | INTRAVENOUS | Status: AC
  Administered 2018-09-13: 10:00:00 via INTRAVENOUS
  Filled 2018-09-13: qty 250

## 2018-09-13 MED ORDER — SODIUM CHLORIDE 0.9 % IV SOLN
200.0000 mg | Freq: Once | INTRAVENOUS | Status: AC
  Administered 2018-09-13: 200 mg via INTRAVENOUS
  Filled 2018-09-13: qty 8

## 2018-09-13 NOTE — Assessment & Plan Note (Addendum)
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  08/19/2018- CT C/A/P- "mild progression" of adenopathy within the chest and pelvis.    # Proceed with cycle #5 of keytruda today. Labs today reviewed;  acceptable for treatment today.  Discussed that the interim CAT scan in January 2020 showed "mild progression"-would recommend repeat PET scan after 3 more cycles.  #Colon cancer.  Stage IIII-  see above.  #Family history of cancer/MSI high status-awaiting genetics evaluation- on 04/30.   #Iron deficiency anemia-hemoglobin 13.  Improved hold IV iron at this time.  #Left knee pain/arthritic pain-worsening refer to orthopedics.  #Cirrhosis/ spleenomegaly-Hepatitis C-positive/HCV RNA positive.  STABLE.   #DISPOSITION:  [name] today. # left knee pain- Orthoreferral # follow up 3 weeks for labs/MD/Keytruda-Dr.B

## 2018-09-13 NOTE — Progress Notes (Signed)
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       HISTORY OF PRESENTING ILLNESS: Patient is a poor historian.  Is alone. Chad [name] 58 y.o.  male above multiple medical problems-urothelial cancer/question advanced currently on Keytruda is here for follow-up.  Patient appetite is good.  He is gaining weight.  No diarrhea.  No nausea no vomiting.  Continues to have chronic left knee pain.  Chronic cough chronic shortness of breath especially exertion.  Leg settings improved; not resolved. Review of Systems  Constitutional: Positive for malaise/fatigue. Negative for chills, diaphoresis, fever and weight loss.  HENT: Negative for nosebleeds and sore throat.   Eyes: Negative for double vision.  Respiratory: Positive for cough and shortness of breath. Negative for hemoptysis and wheezing.   Cardiovascular:  Positive for leg swelling. Negative for chest pain, palpitations and orthopnea.  Gastrointestinal: Negative for abdominal pain, blood in stool, constipation, diarrhea, heartburn, melena, nausea and vomiting.  Genitourinary: Negative for dysuria, frequency and urgency.  Musculoskeletal: Positive for back pain and joint pain.  Skin: Negative.  Negative for itching and rash.  Neurological: Positive for focal weakness. Negative for dizziness, tingling, weakness and headaches.       Chronic left-sided weakness upper than lower extremity.  Endo/Heme/Allergies: Does not bruise/bleed easily.  Psychiatric/Behavioral: Negative for depression. The patient is not nervous/anxious and does not have insomnia.      MEDICAL HISTORY:  Past Medical History:  Diagnosis Date  . Anemia   . Anxiety   . ARF (acute respiratory failure) ([name])   . Bladder cancer ([place])   . Cancer [facility])    prostate  . COPD (chronic obstructive pulmonary disease) ([place])   . Depression   . Dysphagia   . GERD (gastroesophageal reflux disease)   . Hepatitis    chronic hep c  . Hyperlipidemia   . Malignant neoplasm of colon ([place])   . Nerve pain   . Stroke [facility])     SURGICAL HISTORY: Past Surgical History:  Procedure Laterality Date  . COLON SURGERY     En bloc extended right hemicolectomy 07/2017  . CYSTOSCOPY W/ RETROGRADES Right 08/30/2018   Procedure: CYSTOSCOPY WITH RETROGRADE PYELOGRAM;  Surgeon: Hollice Espy, MD;  Location: [facility] ORS;  Service: Urology;  Laterality: Right;  . CYSTOSCOPY WITH STENT PLACEMENT Right 04/25/2018   Procedure: CYSTOSCOPY WITH STENT PLACEMENT;  Surgeon: Hollice Espy, MD;  Location: [facility] ORS;  Service: Urology;  Laterality: Right;  . CYSTOSCOPY WITH STENT PLACEMENT Right 08/30/2018   Procedure: [place] WITH STENT Exchange;  Surgeon: Hollice Espy, MD;  Location: [facility] ORS;  Service: Urology;  Laterality: Right;  . tumor removed       SOCIAL HISTORY: Social History   Socioeconomic  History  . Marital status: Single    Spouse name: Not on file  . Number of children: Not on file  . Years of education: Not on file  . Highest education level: Not on file  Occupational History  . Not on file  Social Needs  . Financial resource strain: Not on file  . Food insecurity:    Worry: Not on file    Inability: Not on file  . Transportation needs:    Medical: Not on file    Non-medical: Not on file  Tobacco Use  . Smoking status: Current Every Day Smoker    Packs/day: 1.00    Types: Cigarettes  . Smokeless tobacco: Never Used  Substance and Sexual Activity  . Alcohol use: Not Currently    Frequency: Never  . Drug use: Not Currently  . Sexual activity: Not on file  Lifestyle  . Physical activity:    Days per week: Not on file    Minutes per session: Not on file  . Stress: Not on file  Relationships  . Social connections:    Talks on phone: Not on file    Gets together: Not on file    Attends religious service: Not on file    Active member of club or organization: Not on file    Attends meetings of clubs or organizations: Not on file    Relationship status: Not on file  . Intimate partner violence:    Fear of current or ex partner: Not on file    Emotionally abused: Not on file    Physically abused: Not on file    Forced sexual activity: Not on file  Other Topics Concern  . Not on file  Social History Narrative    used to live [place]; moved  To [place]- end of April 2019; in Nursing home; 1pp/day; quit alcohol. 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        Family- dad- prostate ca [at 81y]; brother- 21 died of prostate cancer; brother- 35- no cancers [[place]]; sonGerald [name] [[place]];Chad [name]-32y prostate ca Chad [name]]; daughter- 19 [[place]]; another daughter 42 [[place]/addict]. will refer genetics counseling. Given MSI- abnormal; highly suspicious of Lynch syndrome.  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    FAMILY HISTORY: Family History  Problem Relation  Age of Onset  . Prostate cancer Father     ALLERGIES:  is allergic to penicillins.  MEDICATIONS:  Current Outpatient Medications  Medication Sig Dispense Refill  . acetaminophen (TYLENOL) 325 MG tablet Take 650 mg by mouth 3 (three) times daily.     . diclofenac sodium (VOLTAREN) 1 % GEL Apply 2 g topically See admin instructions. Apply 2 g topically 4 times daily. Apply 2 g to left shoulder, apply 2 g to left hip and 2 g to left knee 4 times daily per [facility] instructions    . escitalopram (LEXAPRO) 20 MG tablet Take 1 tablet (20 mg total) by mouth daily. 90 tablet 3  . gabapentin (NEURONTIN) 100 MG capsule Take one to 2 tabs for pain 3 x day (Patient taking differently: Take 100 mg by mouth  3 (three) times daily. ) 180 capsule 3  . mirtazapine (REMERON) 7.5 MG tablet Take 7.5 mg by mouth at bedtime.    Marland Kitchen oxyCODONE (OXY IR/ROXICODONE) 5 MG immediate release tablet Take 5 mg by mouth every 6 (six) hours.     . pantoprazole (PROTONIX) 40 MG tablet Take 40 mg by mouth daily.    . QUEtiapine (SEROQUEL) 25 MG tablet Take 12.5 mg by mouth at bedtime.     No current facility-administered medications for this visit.    Facility-Administered Medications Ordered in Other Visits  Medication Dose Route Frequency Provider Last Rate Last Dose  . pembrolizumab (KEYTRUDA) 200 mg in sodium chloride 0.9 % 50 mL chemo infusion  200 mg Intravenous Once Charlaine Dalton R, MD          .  PHYSICAL EXAMINATION: ECOG PERFORMANCE STATUS: 1 - Symptomatic but completely ambulatory  Vitals:   09/13/18 0928  BP: 106/72  Pulse: 77  Resp: 18  Temp: (!) 96 F (35.6 C)   Filed Weights   09/13/18 0928  Weight: 152 lb (68.9 kg)    Physical Exam  Constitutional: He is oriented to person, place, and time.  In wheelchair. Cachectic.  Appears older than his stated age.  Alone.  HENT:  Head: Normocephalic and atraumatic.  Mouth/Throat: Oropharynx is clear and moist. No oropharyngeal exudate.  Eyes: Pupils  are equal, round, and reactive to light.  Neck: Normal range of motion. Neck supple.  Cardiovascular: Normal rate and regular rhythm.  Pulmonary/Chest: No respiratory distress. He has no wheezes.  Abdominal: Soft. Bowel sounds are normal. He exhibits no distension and no mass. There is no abdominal tenderness. There is no rebound and no guarding.  Musculoskeletal: Normal range of motion.        General: No tenderness or edema.  Neurological: He is alert and oriented to person, place, and time.  Weakness left upper and lower extremity.  Skin: Skin is warm.  Psychiatric: Affect normal.     LABORATORY DATA:  I have reviewed the data as listed Lab Results  Component Value Date   WBC 8.1 09/13/2018   HGB 13.2 09/13/2018   HCT 41.4 09/13/2018   MCV 80.7 09/13/2018   PLT 202 09/13/2018   Recent Labs    08/02/18 0938 08/23/18 0918 09/13/18 0845  NA 134* 134* 135  K 4.1 4.1 4.3  CL 102 105 106  CO2 25 22 20*  GLUCOSE 91 107* 110*  BUN '16 18 20  ' CREATININE 0.88 0.99 1.09  CALCIUM 8.4* 8.4* 9.0  GFRNONAA >60 >60 >60  GFRAA >60 >60 >60  PROT 7.4 7.2 8.0  ALBUMIN 2.8* 2.7* 3.4*  AST 45* 30 24  ALT '23 15 11  ' ALKPHOS 92 76 99  BILITOT 0.8 0.5 0.4    RADIOGRAPHIC STUDIES: I have personally reviewed the radiological images as listed and agreed with the findings in the report. Ct Chest W Contrast  Result Date: 08/19/2018 CLINICAL DATA:  Right ureteral cancer. Personal history of colon cancer. EXAM: CT CHEST, ABDOMEN, AND PELVIS WITH CONTRAST TECHNIQUE: Multidetector CT imaging of the chest, abdomen and pelvis was performed following the standard protocol during bolus administration of intravenous contrast. CONTRAST:  61m ISOVUE-300 IOPAMIDOL (ISOVUE-300) INJECTION 61% COMPARISON:  PET of 05/31/2018.  Prior diagnostic CTs of 05/07/2018. FINDINGS: CT CHEST FINDINGS Cardiovascular: Aortic and branch vessel atherosclerosis. Tortuous thoracic aorta. Normal heart size, without pericardial  effusion. Lad coronary artery calcification. No central pulmonary embolism, on this non-dedicated study. Mediastinum/Nodes:  No supraclavicular adenopathy. Index right axillary node measures 2.3 x 1.0 cm today versus similar on 05/31/2018. A left axillary node measures 10 mm on image 17/2 and is similar to on the prior PET. Left subpectoral adenopathy at 1.1 cm on image 12/2, increased from 8 mm on the prior PET (when remeasured). No mediastinal or hilar adenopathy. Contrast level in the esophagus on image 14/2. Lungs/Pleura: No pleural fluid. Probable secretions within the dependent endobronchial tree. Centrilobular and paraseptal emphysema. Two subpleural right upper lobe pulmonary nodules on the order of 2 mm are similar. The left upper lobe tree-in-bud nodularity has resolved. Musculoskeletal: No acute osseous abnormality. CT ABDOMEN PELVIS FINDINGS Hepatobiliary: Caudate lobe prominence and subtle atrophy of the medial segment left liver lobe. No focal liver lesion. Normal gallbladder, without biliary ductal dilatation. Pancreas: Normal, without mass or ductal dilatation. Spleen: Splenomegaly, including at 12.5 x 14.3 x 7.2 cm. This is increased. Adrenals/Urinary Tract: Normal adrenal glands. Interpolar 1.4 cm left renal cyst or minimally complex cyst. Mild right renal atrophy with a right-sided ureteric stent in place. Absent enhancement in the right renal pelvis on delayed images could be due to decreased renal function or infiltrative tumor. Small lower pole right renal collecting system calculi. No hydronephrosis. Normal urinary bladder. Stomach/Bowel: Normal stomach, without wall thickening. Right hemicolectomy. Normal small bowel. Vascular/Lymphatic: Advanced aortic and branch vessel atherosclerosis. Multiple small retroperitoneal nodes within the abdomen. None are pathologic by size criteria. A right common iliac 1.2 cm node on image 92/2 is increased from 7 mm on the prior PET (when remeasured). Right  external iliac node measures 1.1 cm on image 108/2 and is similar to on the prior PET (when remeasured). More inferior and lateral right external iliac node measures 1.2 by 2.9 cm today versus 1.2 x 2.6 cm on the prior PET (when remeasured). Left external iliac node measures 1.4 cm today versus 1.3 cm on the prior PET. Reproductive: Normal prostate. Other: No significant free fluid. Musculoskeletal: Degenerate changes of both hips. IMPRESSION: 1. Since the PET of 05/31/2018, mild progression of adenopathy within the chest and pelvis as detailed above. 2. Right ureteric stent in place, without hydronephrosis. 3. Progressive splenomegaly. Probable mild cirrhosis. Correlate with risk factors. 4. Resolved left upper lobe infection since 05/07/2018. Resolved bilateral pleural effusions. 5. Aortic atherosclerosis (ICD10-I70.0), coronary artery atherosclerosis and emphysema (ICD10-J43.9). 6. Esophageal air fluid level suggests dysmotility or gastroesophageal reflux. 7. Right nephrolithiasis. Electronically Signed   By: Abigail Miyamoto M.D.   On: 08/19/2018 12:35   Ct Abdomen Pelvis W Contrast  Result Date: 08/19/2018 CLINICAL DATA:  Right ureteral cancer. Personal history of colon cancer. EXAM: CT CHEST, ABDOMEN, AND PELVIS WITH CONTRAST TECHNIQUE: Multidetector CT imaging of the chest, abdomen and pelvis was performed following the standard protocol during bolus administration of intravenous contrast. CONTRAST:  9m ISOVUE-300 IOPAMIDOL (ISOVUE-300) INJECTION 61% COMPARISON:  PET of 05/31/2018.  Prior diagnostic CTs of 05/07/2018. FINDINGS: CT CHEST FINDINGS Cardiovascular: Aortic and branch vessel atherosclerosis. Tortuous thoracic aorta. Normal heart size, without pericardial effusion. Lad coronary artery calcification. No central pulmonary embolism, on this non-dedicated study. Mediastinum/Nodes: No supraclavicular adenopathy. Index right axillary node measures 2.3 x 1.0 cm today versus similar on 05/31/2018. A left  axillary node measures 10 mm on image 17/2 and is similar to on the prior PET. Left subpectoral adenopathy at 1.1 cm on image 12/2, increased from 8 mm on the prior PET (when remeasured). No mediastinal or hilar adenopathy. Contrast level in the esophagus on image  14/2. Lungs/Pleura: No pleural fluid. Probable secretions within the dependent endobronchial tree. Centrilobular and paraseptal emphysema. Two subpleural right upper lobe pulmonary nodules on the order of 2 mm are similar. The left upper lobe tree-in-bud nodularity has resolved. Musculoskeletal: No acute osseous abnormality. CT ABDOMEN PELVIS FINDINGS Hepatobiliary: Caudate lobe prominence and subtle atrophy of the medial segment left liver lobe. No focal liver lesion. Normal gallbladder, without biliary ductal dilatation. Pancreas: Normal, without mass or ductal dilatation. Spleen: Splenomegaly, including at 12.5 x 14.3 x 7.2 cm. This is increased. Adrenals/Urinary Tract: Normal adrenal glands. Interpolar 1.4 cm left renal cyst or minimally complex cyst. Mild right renal atrophy with a right-sided ureteric stent in place. Absent enhancement in the right renal pelvis on delayed images could be due to decreased renal function or infiltrative tumor. Small lower pole right renal collecting system calculi. No hydronephrosis. Normal urinary bladder. Stomach/Bowel: Normal stomach, without wall thickening. Right hemicolectomy. Normal small bowel. Vascular/Lymphatic: Advanced aortic and branch vessel atherosclerosis. Multiple small retroperitoneal nodes within the abdomen. None are pathologic by size criteria. A right common iliac 1.2 cm node on image 92/2 is increased from 7 mm on the prior PET (when remeasured). Right external iliac node measures 1.1 cm on image 108/2 and is similar to on the prior PET (when remeasured). More inferior and lateral right external iliac node measures 1.2 by 2.9 cm today versus 1.2 x 2.6 cm on the prior PET (when remeasured). Left  external iliac node measures 1.4 cm today versus 1.3 cm on the prior PET. Reproductive: Normal prostate. Other: No significant free fluid. Musculoskeletal: Degenerate changes of both hips. IMPRESSION: 1. Since the PET of 05/31/2018, mild progression of adenopathy within the chest and pelvis as detailed above. 2. Right ureteric stent in place, without hydronephrosis. 3. Progressive splenomegaly. Probable mild cirrhosis. Correlate with risk factors. 4. Resolved left upper lobe infection since 05/07/2018. Resolved bilateral pleural effusions. 5. Aortic atherosclerosis (ICD10-I70.0), coronary artery atherosclerosis and emphysema (ICD10-J43.9). 6. Esophageal air fluid level suggests dysmotility or gastroesophageal reflux. 7. Right nephrolithiasis. Electronically Signed   By: Abigail Miyamoto M.D.   On: 08/19/2018 12:35    ASSESSMENT & PLAN:   Ureteral cancer, right ([place]) # High-grade urothelial cancer/cytology; likely of the right renal pelvis /upper ureter.  08/19/2018- CT C/A/P- "mild progression" of adenopathy within the chest and pelvis.    # Proceed with cycle #5 of keytruda today. Labs today reviewed;  acceptable for treatment today.  Discussed that the interim CAT scan in January 2020 showed "mild progression"-would recommend repeat PET scan after 3 more cycles.  #Colon cancer.  Stage IIII-  see above.  #Family history of cancer/MSI high status-awaiting genetics evaluation- on 04/30.   #Iron deficiency anemia-hemoglobin 13.  Improved hold IV iron at this time.  #Left knee pain/arthritic pain-worsening refer to orthopedics.  #Cirrhosis/ spleenomegaly-Hepatitis C-positive/HCV RNA positive.  STABLE.   #DISPOSITION:  [name] today. # left knee pain- Orthoreferral # follow up 3 weeks for labs/MD/Keytruda-Dr.B    All questions were answered. The patient knows to call the clinic with any problems, questions or concerns.    Cammie Sickle, MD 09/13/2018 10:57 AM

## 2018-09-14 LAB — MICROALBUMIN / CREATININE URINE RATIO
CREATININE, UR: 91.6 mg/dL
Microalb Creat Ratio: 425 mg/g creat — ABNORMAL HIGH (ref 0–29)
Microalb, Ur: 389.2 ug/mL — ABNORMAL HIGH

## 2018-10-04 ENCOUNTER — Other Ambulatory Visit: Payer: Self-pay | Admitting: *Deleted

## 2018-10-04 ENCOUNTER — Inpatient Hospital Stay (HOSPITAL_BASED_OUTPATIENT_CLINIC_OR_DEPARTMENT_OTHER): Payer: Medicaid Other | Admitting: Internal Medicine

## 2018-10-04 ENCOUNTER — Inpatient Hospital Stay: Payer: Medicaid Other | Attending: Internal Medicine

## 2018-10-04 ENCOUNTER — Other Ambulatory Visit: Payer: Self-pay

## 2018-10-04 ENCOUNTER — Inpatient Hospital Stay: Payer: Medicaid Other

## 2018-10-04 VITALS — BP 100/65 | HR 77 | Temp 97.6°F | Resp 20 | Ht 67.0 in | Wt 150.0 lb

## 2018-10-04 DIAGNOSIS — J449 Chronic obstructive pulmonary disease, unspecified: Secondary | ICD-10-CM | POA: Diagnosis not present

## 2018-10-04 DIAGNOSIS — C661 Malignant neoplasm of right ureter: Secondary | ICD-10-CM | POA: Diagnosis not present

## 2018-10-04 DIAGNOSIS — Z791 Long term (current) use of non-steroidal anti-inflammatories (NSAID): Secondary | ICD-10-CM

## 2018-10-04 DIAGNOSIS — D509 Iron deficiency anemia, unspecified: Secondary | ICD-10-CM | POA: Insufficient documentation

## 2018-10-04 DIAGNOSIS — L97909 Non-pressure chronic ulcer of unspecified part of unspecified lower leg with unspecified severity: Secondary | ICD-10-CM

## 2018-10-04 DIAGNOSIS — I739 Peripheral vascular disease, unspecified: Secondary | ICD-10-CM | POA: Insufficient documentation

## 2018-10-04 DIAGNOSIS — L97919 Non-pressure chronic ulcer of unspecified part of right lower leg with unspecified severity: Secondary | ICD-10-CM | POA: Insufficient documentation

## 2018-10-04 DIAGNOSIS — L97929 Non-pressure chronic ulcer of unspecified part of left lower leg with unspecified severity: Secondary | ICD-10-CM | POA: Insufficient documentation

## 2018-10-04 DIAGNOSIS — Z8673 Personal history of transient ischemic attack (TIA), and cerebral infarction without residual deficits: Secondary | ICD-10-CM | POA: Diagnosis not present

## 2018-10-04 DIAGNOSIS — Z9221 Personal history of antineoplastic chemotherapy: Secondary | ICD-10-CM

## 2018-10-04 DIAGNOSIS — E785 Hyperlipidemia, unspecified: Secondary | ICD-10-CM | POA: Diagnosis not present

## 2018-10-04 DIAGNOSIS — R531 Weakness: Secondary | ICD-10-CM

## 2018-10-04 DIAGNOSIS — R161 Splenomegaly, not elsewhere classified: Secondary | ICD-10-CM

## 2018-10-04 DIAGNOSIS — Z79899 Other long term (current) drug therapy: Secondary | ICD-10-CM | POA: Insufficient documentation

## 2018-10-04 DIAGNOSIS — B182 Chronic viral hepatitis C: Secondary | ICD-10-CM | POA: Diagnosis not present

## 2018-10-04 DIAGNOSIS — Z5112 Encounter for antineoplastic immunotherapy: Secondary | ICD-10-CM | POA: Insufficient documentation

## 2018-10-04 DIAGNOSIS — R5381 Other malaise: Secondary | ICD-10-CM

## 2018-10-04 DIAGNOSIS — R5383 Other fatigue: Secondary | ICD-10-CM | POA: Diagnosis not present

## 2018-10-04 DIAGNOSIS — C189 Malignant neoplasm of colon, unspecified: Secondary | ICD-10-CM

## 2018-10-04 DIAGNOSIS — R600 Localized edema: Secondary | ICD-10-CM | POA: Insufficient documentation

## 2018-10-04 DIAGNOSIS — M25562 Pain in left knee: Secondary | ICD-10-CM

## 2018-10-04 DIAGNOSIS — K219 Gastro-esophageal reflux disease without esophagitis: Secondary | ICD-10-CM | POA: Insufficient documentation

## 2018-10-04 DIAGNOSIS — Z515 Encounter for palliative care: Secondary | ICD-10-CM

## 2018-10-04 DIAGNOSIS — K7460 Unspecified cirrhosis of liver: Secondary | ICD-10-CM

## 2018-10-04 DIAGNOSIS — F17210 Nicotine dependence, cigarettes, uncomplicated: Secondary | ICD-10-CM | POA: Diagnosis not present

## 2018-10-04 LAB — CBC WITH DIFFERENTIAL/PLATELET
ABS IMMATURE GRANULOCYTES: 0.02 10*3/uL (ref 0.00–0.07)
BASOS ABS: 0.1 10*3/uL (ref 0.0–0.1)
Basophils Relative: 1 %
Eosinophils Absolute: 0.3 10*3/uL (ref 0.0–0.5)
Eosinophils Relative: 4 %
HCT: 40.5 % (ref 39.0–52.0)
Hemoglobin: 12.9 g/dL — ABNORMAL LOW (ref 13.0–17.0)
IMMATURE GRANULOCYTES: 0 %
Lymphocytes Relative: 15 %
Lymphs Abs: 0.9 10*3/uL (ref 0.7–4.0)
MCH: 25.8 pg — ABNORMAL LOW (ref 26.0–34.0)
MCHC: 31.9 g/dL (ref 30.0–36.0)
MCV: 81 fL (ref 80.0–100.0)
Monocytes Absolute: 0.4 10*3/uL (ref 0.1–1.0)
Monocytes Relative: 7 %
NEUTROS ABS: 4.4 10*3/uL (ref 1.7–7.7)
Neutrophils Relative %: 73 %
Platelets: 183 10*3/uL (ref 150–400)
RBC: 5 MIL/uL (ref 4.22–5.81)
RDW: 15.9 % — ABNORMAL HIGH (ref 11.5–15.5)
WBC: 6.2 10*3/uL (ref 4.0–10.5)
nRBC: 0 % (ref 0.0–0.2)

## 2018-10-04 LAB — COMPREHENSIVE METABOLIC PANEL
ALT: 10 U/L (ref 0–44)
AST: 19 U/L (ref 15–41)
Albumin: 3.4 g/dL — ABNORMAL LOW (ref 3.5–5.0)
Alkaline Phosphatase: 86 U/L (ref 38–126)
Anion gap: 6 (ref 5–15)
BUN: 16 mg/dL (ref 6–20)
CO2: 22 mmol/L (ref 22–32)
Calcium: 8.4 mg/dL — ABNORMAL LOW (ref 8.9–10.3)
Chloride: 107 mmol/L (ref 98–111)
Creatinine, Ser: 0.94 mg/dL (ref 0.61–1.24)
GFR calc Af Amer: >60 mL/min (ref >60)
GFR calc non Af Amer: >60 mL/min (ref >60)
Glucose, Bld: 88 mg/dL (ref 70–99)
POTASSIUM: 3.8 mmol/L (ref 3.5–5.1)
SODIUM: 135 mmol/L (ref 135–145)
Total Bilirubin: 0.5 mg/dL (ref 0.3–1.2)
Total Protein: 7.5 g/dL (ref 6.5–8.1)

## 2018-10-04 MED ORDER — SODIUM CHLORIDE 0.9 % IV SOLN
200.0000 mg | Freq: Once | INTRAVENOUS | Status: AC
  Administered 2018-10-04: 200 mg via INTRAVENOUS
  Filled 2018-10-04: qty 8

## 2018-10-04 MED ORDER — SODIUM CHLORIDE 0.9 % IV SOLN
Freq: Once | INTRAVENOUS | Status: AC
  Administered 2018-10-04: 10:00:00 via INTRAVENOUS
  Filled 2018-10-04: qty 250

## 2018-10-04 NOTE — Assessment & Plan Note (Addendum)
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  08/19/2018- CT C/A/P- "mild progression" of adenopathy within the chest and pelvis.  Clinically stable.   # Proceed with cycle #6 of keytruda today. Labs today reviewed;  acceptable for treatment today.   #Colon cancer.  Stage IIII-  Stable.   #Iron deficiency anemia-hemoglobin 13. STABLE.  #Left knee pain/arthritic pain-worse; refer to ortho.  #Cirrhosis/ spleenomegaly-Hepatitis C-positive/HCV RNA positive.  Stable.   # Bilateral LE ulcers- ? Arterial insuffiencey- arterial doppelrs asap; if positive then- vascular referral; wound care referral  #DISPOSITION:  # keytruda today. # arterial dopplers- asap.  # wound care referral # left knee pain- Ortho referral # follow up 3 weeks for labs/MD/Keytruda-Dr.B

## 2018-10-04 NOTE — Progress Notes (Signed)
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       HISTORY OF PRESENTING ILLNESS: Patient is a poor historian.  Is alone. Chanze Teagle 58 y.o.  male above multiple medical problems-urothelial cancer/question advanced currently on Keytruda is here for follow-up.  Patient noted to have ulceration of his bilateral lower extremities.  This is been going on for " some time".  Denies any trauma.  Continues to have left knee pain.  Appetite is fair.  He is not losing significant weight. Chronic left lower extremity weakness upper extremity weakness. Review of Systems  Constitutional: Positive for malaise/fatigue. Negative for chills, diaphoresis, fever and weight loss.  HENT: Negative for nosebleeds and sore throat.   Eyes: Negative for double vision.  Respiratory: Positive for cough and  shortness of breath. Negative for hemoptysis and wheezing.   Cardiovascular: Positive for leg swelling. Negative for chest pain, palpitations and orthopnea.  Gastrointestinal: Negative for abdominal pain, blood in stool, constipation, diarrhea, heartburn, melena, nausea and vomiting.  Genitourinary: Negative for dysuria, frequency and urgency.  Musculoskeletal: Positive for back pain and joint pain.  Skin: Negative.  Negative for itching and rash.  Neurological: Positive for focal weakness. Negative for dizziness, tingling, weakness and headaches.       Chronic left-sided weakness upper than lower extremity.  Endo/Heme/Allergies: Does not bruise/bleed easily.  Psychiatric/Behavioral: Negative for depression. The patient is not nervous/anxious and does not have insomnia.      MEDICAL HISTORY:  Past Medical History:  Diagnosis Date  . Anemia   . Anxiety   . ARF (acute respiratory failure) ([place])   . Bladder cancer ([place])   . Cancer [facility])    prostate  . COPD (chronic obstructive pulmonary disease) ([place])   . Depression   . Dysphagia   . GERD (gastroesophageal reflux disease)   . Hepatitis    chronic hep c  . Hyperlipidemia   . Malignant neoplasm of colon ([place])   . Nerve pain   . Stroke [facility])     SURGICAL HISTORY: Past Surgical History:  Procedure Laterality Date  . COLON SURGERY     En bloc extended right hemicolectomy 07/2017  . CYSTOSCOPY W/ RETROGRADES Right 08/30/2018   Procedure: CYSTOSCOPY WITH RETROGRADE PYELOGRAM;  Surgeon: Hollice Espy, MD;  Location: [facility] ORS;  Service: Urology;  Laterality: Right;  . CYSTOSCOPY  WITH STENT PLACEMENT Right 04/25/2018   Procedure: CYSTOSCOPY WITH STENT PLACEMENT;  Surgeon: Hollice Espy, MD;  Location: [facility] ORS;  Service: Urology;  Laterality: Right;  . CYSTOSCOPY WITH STENT PLACEMENT Right 08/30/2018   Procedure: [place] WITH STENT Exchange;  Surgeon: Hollice Espy, MD;  Location: [facility] ORS;  Service: Urology;  Laterality: Right;  .  tumor removed       SOCIAL HISTORY: Social History   Socioeconomic History  . Marital status: Single    Spouse name: Not on file  . Number of children: Not on file  . Years of education: Not on file  . Highest education level: Not on file  Occupational History  . Not on file  Social Needs  . Financial resource strain: Not on file  . Food insecurity:    Worry: Not on file    Inability: Not on file  . Transportation needs:    Medical: Not on file    Non-medical: Not on file  Tobacco Use  . Smoking status: Current Every Day Smoker    Packs/day: 1.00    Types: Cigarettes  . Smokeless tobacco: Never Used  Substance and Sexual Activity  . Alcohol use: Not Currently    Frequency: Never  . Drug use: Not Currently  . Sexual activity: Not on file  Lifestyle  . Physical activity:    Days per week: Not on file    Minutes per session: Not on file  . Stress: Not on file  Relationships  . Social connections:    Talks on phone: Not on file    Gets together: Not on file    Attends religious service: Not on file    Active member of club or organization: Not on file    Attends meetings of clubs or organizations: Not on file    Relationship status: Not on file  . Intimate partner violence:    Fear of current or ex partner: Not on file    Emotionally abused: Not on file    Physically abused: Not on file    Forced sexual activity: Not on file  Other Topics Concern  . Not on file  Social History Narrative    used to live [place]; moved  To [place]- end of April 2019; in Nursing home; 1pp/day; quit alcohol. 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        Family- dad- prostate ca [at 7y]; brother- 59 died of prostate cancer; brother- 67- no cancers [[place]]; sonGerald Stabs [[place]];[name]-32y prostate ca [facility] [place]]; daughter- 51 [[place]]; another daughter 76 [[place]/addict]. will refer genetics counseling. Given MSI- abnormal; highly suspicious of Lynch syndrome.  Patient's son [name] aware of high possible  lynch syndrome.    FAMILY HISTORY: Family History  Problem Relation Age of Onset  . Prostate cancer Father     ALLERGIES:  is allergic to penicillins.  MEDICATIONS:  Current Outpatient Medications  Medication Sig Dispense Refill  . acetaminophen (TYLENOL) 325 MG tablet Take 650 mg by mouth 3 (three) times daily.     . diclofenac sodium (VOLTAREN) 1 % GEL Apply 2 g topically See admin instructions. Apply 2 g topically 4 times daily. Apply 2 g to left shoulder, apply 2 g to left hip and 2 g to left knee 4 times daily per [facility] instructions    . escitalopram (LEXAPRO) 20 MG tablet Take 1 tablet (20 mg total) by mouth daily. 90 tablet 3  . gabapentin (NEURONTIN) 100 MG capsule Take one to 2 tabs  for pain 3 x day (Patient taking differently: Take 100 mg by mouth 3 (three) times daily. ) 180 capsule 3  . mirtazapine (REMERON) 7.5 MG tablet Take 7.5 mg by mouth at bedtime.    Marland Kitchen oxyCODONE (OXY IR/ROXICODONE) 5 MG immediate release tablet Take 5 mg by mouth every 6 (six) hours.     . pantoprazole (PROTONIX) 40 MG tablet Take 40 mg by mouth daily.     No current facility-administered medications for this visit.       Marland Kitchen  PHYSICAL EXAMINATION: ECOG PERFORMANCE STATUS: 1 - Symptomatic but completely ambulatory  Vitals:   10/04/18 0913  BP: 100/65  Pulse: 77  Resp: 20  Temp: 97.6 F (36.4 C)   Filed Weights   10/04/18 0913  Weight: 150 lb (68 kg)    Physical Exam  Constitutional: He is oriented to person, place, and time.  In wheelchair. Cachectic.  Appears older than his stated age.  Alone.  HENT:  Head: Normocephalic and atraumatic.  Mouth/Throat: Oropharynx is clear and moist. No oropharyngeal exudate.  Eyes: Pupils are equal, round, and reactive to light.  Neck: Normal range of motion. Neck supple.  Cardiovascular: Normal rate and regular rhythm.  Pulmonary/Chest: No respiratory distress. He has no wheezes.  Abdominal: Soft. Bowel sounds are normal. He exhibits no  distension and no mass. There is no abdominal tenderness. There is no rebound and no guarding.  Musculoskeletal: Normal range of motion.        General: No tenderness or edema.  Neurological: He is alert and oriented to person, place, and time.  Weakness left upper and lower extremity.  Skin: Skin is warm.  Bilateral lower extremity ulcerations noted.  Pulses intact bilaterally.  Psychiatric: Affect normal.     LABORATORY DATA:  I have reviewed the data as listed Lab Results  Component Value Date   WBC 6.2 10/04/2018   HGB 12.9 (L) 10/04/2018   HCT 40.5 10/04/2018   MCV 81.0 10/04/2018   PLT 183 10/04/2018   Recent Labs    08/23/18 0918 09/13/18 0845 10/04/18 0833  NA 134* 135 135  K 4.1 4.3 3.8  CL 105 106 107  CO2 22 20* 22  GLUCOSE 107* 110* 88  BUN '18 20 16  ' CREATININE 0.99 1.09 0.94  CALCIUM 8.4* 9.0 8.4*  GFRNONAA >60 >60 >60  GFRAA >60 >60 >60  PROT 7.2 8.0 7.5  ALBUMIN 2.7* 3.4* 3.4*  AST '30 24 19  ' ALT '15 11 10  ' ALKPHOS 76 99 86  BILITOT 0.5 0.4 0.5    RADIOGRAPHIC STUDIES: I have personally reviewed the radiological images as listed and agreed with the findings in the report. No results found.  ASSESSMENT & PLAN:   Ureteral cancer, right ([place]) # High-grade urothelial cancer/cytology; likely of the right renal pelvis /upper ureter.  08/19/2018- CT C/A/P- "mild progression" of adenopathy within the chest and pelvis.  Clinically stable.   # Proceed with cycle #6 of keytruda today. Labs today reviewed;  acceptable for treatment today.   #Colon cancer.  Stage IIII-  Stable.   #Iron deficiency anemia-hemoglobin 13. STABLE.  #Left knee pain/arthritic pain-worse; refer to ortho.  #Cirrhosis/ spleenomegaly-Hepatitis C-positive/HCV RNA positive.  Stable.   # Bilateral LE ulcers- ? Arterial insuffiencey- arterial doppelrs asap; if positive then- vascular referral; wound care referral  #DISPOSITION:  # keytruda today. # arterial dopplers- asap.  # wound  care referral # left knee pain- Ortho referral # follow up 3 weeks for labs/MD/Keytruda-Dr.B  All questions were answered. The patient knows to call the clinic with any problems, questions or concerns.    Cammie Sickle, MD 10/04/2018 9:54 AM

## 2018-10-05 ENCOUNTER — Ambulatory Visit
Admission: RE | Admit: 2018-10-05 | Discharge: 2018-10-05 | Disposition: A | Discharge status: Home / Self Care | Payer: Medicaid Other | Source: Ambulatory Visit | Attending: Internal Medicine | Admitting: Internal Medicine

## 2018-10-05 ENCOUNTER — Other Ambulatory Visit: Payer: Self-pay

## 2018-10-05 DIAGNOSIS — C661 Malignant neoplasm of right ureter: Secondary | ICD-10-CM

## 2018-10-05 DIAGNOSIS — I739 Peripheral vascular disease, unspecified: Secondary | ICD-10-CM | POA: Insufficient documentation

## 2018-10-05 DIAGNOSIS — L97909 Non-pressure chronic ulcer of unspecified part of unspecified lower leg with unspecified severity: Secondary | ICD-10-CM | POA: Diagnosis present

## 2018-10-08 ENCOUNTER — Ambulatory Visit: Payer: Medicaid Other | Admitting: Physician Assistant

## 2018-10-08 ENCOUNTER — Encounter: Payer: Medicaid Other | Attending: Physician Assistant | Admitting: Physician Assistant

## 2018-10-08 ENCOUNTER — Other Ambulatory Visit: Payer: Self-pay

## 2018-10-08 DIAGNOSIS — I69354 Hemiplegia and hemiparesis following cerebral infarction affecting left non-dominant side: Secondary | ICD-10-CM | POA: Insufficient documentation

## 2018-10-08 DIAGNOSIS — L97512 Non-pressure chronic ulcer of other part of right foot with fat layer exposed: Secondary | ICD-10-CM | POA: Diagnosis not present

## 2018-10-08 DIAGNOSIS — L97812 Non-pressure chronic ulcer of other part of right lower leg with fat layer exposed: Secondary | ICD-10-CM | POA: Diagnosis not present

## 2018-10-08 DIAGNOSIS — F17210 Nicotine dependence, cigarettes, uncomplicated: Secondary | ICD-10-CM | POA: Diagnosis not present

## 2018-10-08 DIAGNOSIS — I872 Venous insufficiency (chronic) (peripheral): Secondary | ICD-10-CM | POA: Insufficient documentation

## 2018-10-08 DIAGNOSIS — E11621 Type 2 diabetes mellitus with foot ulcer: Secondary | ICD-10-CM | POA: Diagnosis not present

## 2018-10-08 DIAGNOSIS — D649 Anemia, unspecified: Secondary | ICD-10-CM | POA: Insufficient documentation

## 2018-10-08 DIAGNOSIS — B1920 Unspecified viral hepatitis C without hepatic coma: Secondary | ICD-10-CM | POA: Insufficient documentation

## 2018-10-08 DIAGNOSIS — L97822 Non-pressure chronic ulcer of other part of left lower leg with fat layer exposed: Secondary | ICD-10-CM | POA: Insufficient documentation

## 2018-10-08 DIAGNOSIS — J449 Chronic obstructive pulmonary disease, unspecified: Secondary | ICD-10-CM | POA: Diagnosis not present

## 2018-10-08 DIAGNOSIS — I10 Essential (primary) hypertension: Secondary | ICD-10-CM | POA: Diagnosis not present

## 2018-10-12 NOTE — Progress Notes (Signed)
Chad [name] ([id number]) Visit Report for 10/08/2018 Chief Complaint Document Details Patient Name: Chad [name], Chad [name] Date of Service: 10/08/2018 10:15 AM Medical Record Number: [id number] Patient Account Number: [id number] Date of Birth/Sex: 1961-04-28 (58 y.o. M) Treating RN: Montey Hora Primary Care Provider: Clayborn Bigness Other Clinician: Referring Provider: Charlaine Dalton Treating Provider/Extender: Melburn Hake, Hershal Eriksson Weeks in Treatment: 0 Information Obtained from: Patient Chief Complaint Bilateral LE Ulcers Electronic Signature(s) Signed: 10/08/2018 5:40:27 PM By: Worthy Keeler PA-C Entered By: Worthy Keeler on 10/08/2018 11:21:06 Chad [name] ([id number]) -------------------------------------------------------------------------------- Debridement Details Patient Name: Chad [name] Date of Service: 10/08/2018 10:15 AM Medical Record Number: [id number] Patient Account Number: [id number] Date of Birth/Sex: 11-01-1960 (58 y.o. M) Treating RN: Montey Hora Primary Care Provider: Clayborn Bigness Other Clinician: Referring Provider: Charlaine Dalton Treating Provider/Extender: STONE III, Sokhna Christoph Weeks in Treatment: 0 Debridement Performed for Wound #3 Left,Proximal,Lateral Lower Leg Assessment: Performed By: Physician STONE III, Laurie Lovejoy E., PA-C Debridement Type: Debridement Severity of Tissue Pre Fat layer exposed Debridement: Level of Consciousness (Pre- Awake and Alert procedure): Pre-procedure Verification/Time Yes - 11:34 Out Taken: Start Time: 11:34 Pain Control: Lidocaine 4% Topical Solution Total Area Debrided (L x W): 1.6 (cm) x 1 (cm) = 1.6 (cm) Tissue and other material Viable, Non-Viable, Eschar, Subcutaneous debrided: Level: Skin/Subcutaneous Tissue Debridement Description: Excisional Instrument: Curette Bleeding: Minimum Hemostasis Achieved: Pressure End Time: 11:36 Procedural Pain: 0 Post Procedural Pain: 0 Response to Treatment: Procedure  was tolerated well Level of Consciousness Awake and Alert (Post-procedure): Post Debridement Measurements of Total Wound Length: (cm) 1.6 Width: (cm) 1 Depth: (cm) 0.2 Volume: (cm) 0.251 Character of Wound/Ulcer Post Debridement: Improved Severity of Tissue Post Debridement: Fat layer exposed Post Procedure Diagnosis Same as Pre-procedure Electronic Signature(s) Signed: 10/08/2018 5:05:27 PM By: Montey Hora Signed: 10/08/2018 5:40:27 PM By: Worthy Keeler PA-C Entered By: Montey Hora on 10/08/2018 11:37:20 Chad [name] ([id number]) -------------------------------------------------------------------------------- Debridement Details Patient Name: Chad [name] Date of Service: 10/08/2018 10:15 AM Medical Record Number: [id number] Patient Account Number: [id number] Date of Birth/Sex: February 28, 1961 (58 y.o. M) Treating RN: Montey Hora Primary Care Provider: Clayborn Bigness Other Clinician: Referring Provider: Charlaine Dalton Treating Provider/Extender: STONE III, March Joos Weeks in Treatment: 0 Debridement Performed for Wound #4 Left,Distal,Lateral Lower Leg Assessment: Performed By: Physician STONE III, Alize Borrayo E., PA-C Debridement Type: Debridement Severity of Tissue Pre Fat layer exposed Debridement: Level of Consciousness (Pre- Awake and Alert procedure): Pre-procedure Verification/Time Yes - 11:36 Out Taken: Start Time: 11:36 Pain Control: Lidocaine 4% Topical Solution Total Area Debrided (L x W): 2.5 (cm) x 1 (cm) = 2.5 (cm) Tissue and other material Viable, Non-Viable, Eschar, Subcutaneous debrided: Level: Skin/Subcutaneous Tissue Debridement Description: Excisional Instrument: Curette Bleeding: Minimum Hemostasis Achieved: Pressure End Time: 11:39 Procedural Pain: 0 Post Procedural Pain: 0 Response to Treatment: Procedure was tolerated well Level of Consciousness Awake and Alert (Post-procedure): Post Debridement Measurements of Total Wound Length:  (cm) 2.5 Width: (cm) 1 Depth: (cm) 0.2 Volume: (cm) 0.393 Character of Wound/Ulcer Post Debridement: Improved Severity of Tissue Post Debridement: Fat layer exposed Post Procedure Diagnosis Same as Pre-procedure Electronic Signature(s) Signed: 10/08/2018 5:05:27 PM By: Montey Hora Signed: 10/08/2018 5:40:27 PM By: Worthy Keeler PA-C Entered By: Montey Hora on 10/08/2018 11:40:27 Chad [name] ([id number]) -------------------------------------------------------------------------------- HPI Details Patient Name: Chad [name] Date of Service: 10/08/2018 10:15 AM Medical Record Number: [id number] Patient Account Number: [id number] Date of Birth/Sex: 10-Feb-1961 (58 y.o. M) Treating RN: Montey Hora Primary Care Provider: Clayborn Bigness Other Clinician: Referring Provider: Charlaine Dalton Treating Provider/Extender: Melburn Hake,  Thelia Tanksley Weeks in Treatment: 0 History of Present Illness HPI Description: 10/08/18 on evaluation today patient actually presents to our office for initial evaluation concerning wounds that he has of the bilateral lower extremities. 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 He also has bilateral venous stasis. He apparently has been homeless currently following discharge from the hospital apparently he has been placed at [facility] which is is a skilled nursing facility locally. Nonetheless fortunately he does not show any signs of infection at this time which is good news. In fact several of the wound actually appears to be showing some signs of improvement already in my pinion. There are a couple areas in the left leg in particular there likely gonna require some sharp debridement to help clear away some necrotic tissue and help with more sufficient healing. No fevers, chills, nausea, or  vomiting noted at this time. Electronic Signature(s) Signed: 10/08/2018 5:40:27 PM By: Worthy Keeler PA-C Entered By: Worthy Keeler on 10/08/2018 17:33:23 Chad [name] ([id number]) -------------------------------------------------------------------------------- Physical Exam Details Patient Name: Chad [name] Date of Service: 10/08/2018 10:15 AM Medical Record Number: [id number] Patient Account Number: [id number] Date of Birth/Sex: 07/01/1961 (58 y.o. M) Treating RN: Montey Hora Primary Care Provider: Clayborn Bigness Other Clinician: Referring Provider: Charlaine Dalton Treating Provider/Extender: STONE III, Margrit Minner Weeks in Treatment: 0 Constitutional sitting or standing blood pressure is within target range for patient.. pulse regular and within target range for patient.Marland Kitchen respirations regular, non-labored and within target range for patient.Marland Kitchen temperature within target range for patient.. Well- nourished and well-hydrated in no acute distress. Eyes conjunctiva clear no eyelid edema noted. pupils equal round and reactive to light and accommodation. Ears, Nose, Mouth, and Throat no gross abnormality of ear auricles or external auditory canals. normal hearing noted during conversation. mucus membranes moist. Respiratory normal breathing without difficulty. clear to auscultation bilaterally. Cardiovascular regular rate and rhythm with normal S1, S2. 2+ dorsalis pedis/posterior tibialis pulses. 1+ pitting edema of the bilateral lower extremities. Gastrointestinal (GI) soft, non-tender, non-distended, +BS. no ventral hernia noted. Musculoskeletal Patient unable to walk without assistance. no significant deformity or arthritic changes, no loss or range of motion, no clubbing. Psychiatric this patient is able to make decisions and demonstrates good insight into disease process. Alert and Oriented x 3. pleasant and cooperative. Notes Upon evaluation today patient's wound bed's  again did require sharp debridement in regard to left lower extremity on the right lower Trinity was not necessary to debride. Post debridement wound bed appear to be doing better although there's still some evidence of necrotic tissue at this time the overall appearance was greatly improved. He did have some discomfort with the debridement today but fortunately nothing too significant. Electronic Signature(s) Signed: 10/08/2018 5:40:27 PM By: Worthy Keeler PA-C Entered By: Worthy Keeler on 10/08/2018 17:35:28 Chad [name] ([id number]) -------------------------------------------------------------------------------- Physician Orders Details Patient Name: Chad [name] Date of Service: 10/08/2018 10:15 AM Medical Record Number: [id number] Patient Account Number: [id number] Date of Birth/Sex: November 09, 1960 (58 y.o. 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 Wound #2 Right,Lateral Lower Leg o Cleanse wound with mild soap and water o May Shower, gently pat wound dry prior to applying new dressing. Wound #3 Left,Proximal,Lateral Lower Leg o Cleanse wound with mild soap and water o May Shower,  gently pat wound dry prior to applying new dressing. Wound #4 Left,Distal,Lateral Lower Leg o Cleanse wound with mild soap and water o May Shower, gently pat wound dry prior to applying new dressing. Wound #5 Dorsal Toe Second o Cleanse wound with mild soap and water o May Shower, gently pat wound dry prior to applying new dressing. Anesthetic (add to Medication List) Wound #1 Right,Anterior Lower Leg o Topical Lidocaine 4% cream applied to wound bed prior to debridement (In Clinic Only). Wound #2 Right,Lateral Lower Leg o Topical Lidocaine 4% cream applied to wound bed prior to debridement (In Clinic Only). Wound #3 Left,Proximal,Lateral Lower Leg o Topical Lidocaine 4% cream applied to wound bed prior to debridement (In Clinic Only). Wound #4 Left,Distal,Lateral Lower Leg o Topical Lidocaine 4% cream applied to wound bed prior to debridement (In Clinic Only). Wound #5 Dorsal Toe Second o Topical Lidocaine 4% cream applied to wound bed prior to debridement (In Clinic Only). LAURI, TILL ([id number]) Primary Wound Dressing Wound #1 Right,Anterior Lower Leg o Iodoflex Wound #2 Right,Lateral Lower Leg o Iodoflex Wound #3 Left,Proximal,Lateral Lower Leg o Iodoflex Wound #4 Left,Distal,Lateral Lower Leg o Iodoflex Wound #5 Dorsal Toe Second o Iodoflex Secondary Dressing Wound #1 Right,Anterior Lower Leg o ABD pad Wound #2 Right,Lateral Lower Leg o ABD pad Wound #3 Left,Proximal,Lateral Lower Leg o ABD pad Wound #4 Left,Distal,Lateral Lower Leg o ABD pad Wound #5 Dorsal Toe Second o ABD pad Dressing Change Frequency Wound #1 Right,Anterior Lower Leg o Change Dressing Monday, Wednesday, Friday Wound #2 Right,Lateral Lower Leg o Change Dressing Monday, Wednesday, Friday Wound #3 Left,Proximal,Lateral Lower Leg o Change Dressing Monday, Wednesday, Friday Wound #4 Left,Distal,Lateral Lower Leg o Change Dressing Monday,  Wednesday, Friday Wound #5 Dorsal Toe Second o Change Dressing Monday, Wednesday, Friday Follow-up Appointments Wound #1 Right,Anterior Lower Leg o Return Appointment in 1 week. Wound #2 Right,Lateral Lower Leg o Return Appointment in 1 week. BARY, LIMBACH ([id number]) Wound #3 Left,Proximal,Lateral Lower Leg o Return Appointment in 1 week. Wound #4 Left,Distal,Lateral Lower Leg o Return Appointment in 1 week. Wound #5 Dorsal Toe Second o Return Appointment in 1 week. Edema Control Wound #1 Right,Anterior Lower Leg o Kerlix and Coban - Bilateral - WRAP LEGS LIGHTLY FROM TOES TO 3CM BELOW THE KNEE - PLEASE DO NOT WRAP TOO TIGHT Wound #2 Right,Lateral Lower Leg o Kerlix and Coban - Bilateral - WRAP LEGS LIGHTLY FROM TOES TO 3CM BELOW THE KNEE - PLEASE DO NOT WRAP TOO TIGHT Wound #3 Left,Proximal,Lateral Lower Leg o Kerlix and Coban - Bilateral - WRAP LEGS LIGHTLY FROM TOES TO 3CM BELOW THE KNEE - PLEASE DO NOT WRAP TOO TIGHT Wound #4 Left,Distal,Lateral Lower Leg o Kerlix and Coban - Bilateral - WRAP LEGS LIGHTLY FROM TOES TO 3CM BELOW THE KNEE - PLEASE DO NOT WRAP TOO TIGHT Wound #5 Dorsal Toe Second o Kerlix and Coban - Bilateral - WRAP  LEGS LIGHTLY FROM TOES TO 3CM BELOW THE KNEE - PLEASE DO NOT WRAP TOO TIGHT Electronic Signature(s) Signed: 10/08/2018 5:05:27 PM By: Montey Hora Signed: 10/08/2018 5:40:27 PM By: Worthy Keeler PA-C Entered By: Montey Hora on 10/08/2018 11:43:01 Chad [name] ([id number]) -------------------------------------------------------------------------------- Problem List Details Patient Name: Chad [name] Date of Service: 10/08/2018 10:15 AM Medical Record Number: [id number] Patient Account Number: [id number] Date of Birth/Sex: [birth year] (58 y.o. M) Treating RN: Montey Hora Primary Care Provider: Clayborn Bigness Other Clinician: Referring Provider: Charlaine Dalton Treating Provider/Extender: Melburn Hake,  Voris Tigert Weeks in Treatment: 0 Active Problems ICD-10 Evaluated Encounter Code Description Active Date Today Diagnosis I87.2 Venous insufficiency (chronic) (peripheral) 10/08/2018 No Yes L97.812 Non-pressure chronic ulcer of other part of right lower leg 10/08/2018 No Yes with fat layer exposed L97.822 Non-pressure chronic ulcer of other part of left lower leg with 10/08/2018 No Yes fat layer exposed L97.512 Non-pressure chronic ulcer of other part of right foot with fat 10/08/2018 No Yes layer exposed 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 M) Treating RN: Montey Hora Primary Care Provider: Clayborn Bigness Other Clinician: Referring Provider: Charlaine Dalton Treating Provider/Extender: Melburn Hake, Zuhair Lariccia Weeks in Treatment: 0 Subjective Chief Complaint Information obtained from Patient Bilateral LE Ulcers History of Present Illness (HPI) 10/08/18 on evaluation today patient actually presents to our office for initial evaluation concerning wounds that he has of the bilateral lower extremities. 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 He also has bilateral venous stasis. He apparently has been homeless currently following discharge from the hospital apparently he has been placed at [facility] which is is a skilled nursing facility locally. Nonetheless fortunately he does not show any signs of infection at this time which is good news. In fact several of the wound actually appears to be showing some signs of improvement already in my pinion. There are a couple areas in the left leg in particular there likely gonna require some sharp debridement to help clear away some necrotic tissue and help with more sufficient healing. No fevers, chills, nausea, or vomiting noted at this time. Wound History Patient presents with 3 open wounds that have been present for approximately 2 months. Patient has been treating wounds in the following manner: soap and water. Laboratory tests have not been performed in the last month. Patient reportedly has not tested positive for an antibiotic resistant organism. Patient reportedly has not tested positive for osteomyelitis. Patient reportedly has had testing performed to evaluate circulation in the legs. Patient experiences the following problems associated with their wounds: swelling. Patient History Information obtained from Patient. Allergies penicillin (Severity: Moderate, Reaction: rash) Social History Current every day smoker - 1/2 pack per day, Marital Status - Single, Alcohol Use - Never, Drug Use - No History, Caffeine Use - Daily - cooffee. Medical History Eyes Denies history of Cataracts, Glaucoma, Optic Neuritis Ear/Nose/Mouth/Throat Denies history of Chronic sinus problems/congestion, Middle ear problems Hematologic/Lymphatic Patient has history of Anemia Denies history of Hemophilia, Human Immunodeficiency Virus, Lymphedema, Sickle  Cell Disease Respiratory Patient has history of Chronic Obstructive Pulmonary Disease (COPD) Denies history of Aspiration, Asthma, Pneumothorax, Sleep Apnea, Tuberculosis Cardiovascular DONOVEN, PETT ([id number]) Denies history of Angina, Arrhythmia, Congestive Heart Failure, Coronary Artery  Disease, Deep Vein Thrombosis, Hypertension, Hypotension, Myocardial Infarction, Peripheral Arterial Disease, Peripheral Venous Disease, Phlebitis, Vasculitis Gastrointestinal Patient has history of Hepatitis C Endocrine Denies history of Type I Diabetes, Type II Diabetes Genitourinary Denies history of End Stage Renal Disease Immunological Denies history of Lupus Erythematosus, Raynaud s, Scleroderma Integumentary (Skin) Denies history of History of Burn, History of pressure wounds Musculoskeletal Denies history of Gout, Rheumatoid Arthritis, Osteoarthritis, Osteomyelitis Neurologic Denies history of Dementia, Neuropathy, Quadriplegia, Paraplegia, Seizure Disorder Oncologic Patient has history of Received Chemotherapy - infusions Review of Systems (ROS) Constitutional Symptoms (General Health) The patient has no complaints or symptoms. Eyes The patient has no complaints or symptoms. Ear/Nose/Mouth/Throat The patient has no complaints or symptoms, Unable to hear from left ear, patient states it feels clogged. Hematologic/Lymphatic The patient has no complaints or symptoms. Cardiovascular Complains or has symptoms of LE edema. Gastrointestinal The patient has no complaints or symptoms, dysphagia, malignant neoplasm of colon Endocrine Complains or has symptoms of Hepatitis - C. Denies complaints or symptoms of Thyroid disease, Polydypsia (Excessive Thirst). Genitourinary hydronephrosis with ureteral stricture Immunological Complains or has symptoms of Itching - legs itch, tingling. Integumentary (Skin) Complains or has symptoms of Wounds - 3. Denies complaints or symptoms of Bleeding or bruising tendency, Breakdown, Swelling. Musculoskeletal The patient has no complaints or symptoms. Neurologic The patient has no complaints or symptoms, stroke 06/07/2017 left side affected Psychiatric Complains or has symptoms of Anxiety, major depressive disorder Objective Hibbard,  [place] ([id number]) Constitutional sitting or standing blood pressure is within target range for patient.. pulse regular and within target range for patient.Marland Kitchen respirations regular, non-labored and within target range for patient.Marland Kitchen temperature within target range for patient.. Well- nourished and well-hydrated in no acute distress. Vitals Time Taken: 10:00 AM, Height: 67 in, Source: Stated, Weight: 150 lbs, Source: Stated, BMI: 23.5, Temperature: 97.8  F, Pulse: 78 bpm, Respiratory Rate: 18 breaths/min, Blood Pressure: 107/60 mmHg. Eyes conjunctiva clear no eyelid edema noted. pupils equal round and reactive to light and accommodation. Ears, Nose, Mouth, and Throat no gross abnormality of ear auricles or external auditory canals. normal hearing noted during conversation. mucus membranes moist. Respiratory normal breathing without difficulty. clear to auscultation bilaterally. Cardiovascular regular rate and rhythm with normal S1, S2. 2+ dorsalis pedis/posterior tibialis pulses. 1+ pitting edema of the bilateral lower extremities. Gastrointestinal (GI) soft, non-tender, non-distended, +BS. no ventral hernia noted. Musculoskeletal Patient unable to walk without assistance. no significant deformity or arthritic changes, no loss or range of motion, no clubbing. Psychiatric this patient is able to make decisions and demonstrates good insight into disease process. Alert and Oriented x 3. pleasant and cooperative. General Notes: Upon evaluation today patient's wound bed's again did require sharp debridement in regard to left lower extremity on the right lower Trinity was not necessary to debride. Post debridement wound bed appear to be doing better although there's still some evidence of necrotic tissue at this time the overall appearance was greatly improved. He did have some discomfort with the debridement today but fortunately nothing too significant. Integumentary (Hair, Skin) Wound #1  status is Open. Original cause of wound was Gradually Appeared. The wound is located on the Right,Anterior Lower Leg. The wound measures 0.4cm length x 0.7cm width x 0.1cm depth; 0.22cm^2 area and 0.022cm^3 volume. There is  Fat Layer (Subcutaneous Tissue) Exposed exposed. There is no tunneling or undermining noted. There is a small amount of purulent drainage noted. The wound margin is flat and intact. There is no granulation within the wound bed. There is a large (67-100%) amount of necrotic tissue within the wound bed including Eschar. The periwound skin appearance exhibited: Dry/Scaly, Hemosiderin Staining. The periwound skin appearance did not exhibit: Callus, Crepitus, Excoriation, Induration, Rash, Scarring, Maceration, Atrophie Blanche, Cyanosis, Ecchymosis, Mottled, Pallor, Rubor, Erythema. The periwound has tenderness on palpation. Wound #2 status is Open. Original cause of wound was Gradually Appeared. The wound is located on the Right,Lateral Lower Leg. The wound measures 11.5cm length x 1.5cm width x 0.1cm depth; 13.548cm^2 area and 1.355cm^3 volume. There is no tunneling or undermining noted. There is a small amount of purulent drainage noted. The wound margin is flat and intact. There is no granulation within the wound bed. There is a large (67-100%) amount of necrotic tissue within the wound bed including Eschar. The periwound skin appearance exhibited: Dry/Scaly, Hemosiderin Staining, Erythema. The periwound skin appearance did not exhibit: Callus, Crepitus, Excoriation, Induration, Rash, Scarring, Maceration, Atrophie Blanche, Cyanosis, Ecchymosis, Mottled, Pallor, Rubor. The surrounding wound skin color is noted with erythema which is circumferential. Witters, Ara ([id number]) Wound #3 status is Open. Original cause of wound was Gradually Appeared. The wound is located on the Left,Proximal,Lateral Lower Leg. The wound measures 1.6cm length x 1cm width x 0.1cm depth; 1.257cm^2  area and 0.126cm^3 volume. There is no tunneling or undermining noted. There is a small amount of purulent drainage noted. The wound margin is flat and intact. There is no granulation within the wound bed. There is a large (67-100%) amount of necrotic tissue within the wound bed including Eschar. The periwound skin appearance exhibited: Dry/Scaly, Hemosiderin Staining, Erythema. The periwound skin appearance did not exhibit: Callus, Crepitus, Excoriation, Induration, Rash, Scarring, Maceration, Atrophie Blanche, Cyanosis, Ecchymosis, Mottled, Pallor, Rubor. The surrounding wound skin color is noted with erythema which is circumferential. The periwound has tenderness on palpation. Wound #4 status is Open. Original cause of wound was Gradually Appeared. The wound is located on the Left,Distal,Lateral Lower Leg. The wound measures 2.5cm length x 1cm width x 0.1cm depth; 1.963cm^2 area and 0.196cm^3 volume. There is no tunneling or undermining noted. There is a small amount of purulent drainage noted. The wound margin is flat and intact. There is no granulation within the wound bed. There is a large (67-100%) amount of necrotic tissue within the wound bed including Eschar. The periwound skin appearance exhibited: Dry/Scaly, Hemosiderin Staining, Erythema. The surrounding wound skin color is noted with erythema which is circumferential. Wound #5 status is Open. Original cause of wound was Gradually Appeared. The wound is located on the Dorsal Toe Second. The wound measures 0.3cm length x 0.4cm width x 0.1cm depth; 0.094cm^2 area and 0.009cm^3 volume. There is no tunneling or undermining noted. There is a none present amount of drainage noted. The wound margin is epibole. There is no granulation within the wound bed. There is a large (67-100%) amount of necrotic tissue within the wound bed including Adherent Slough. The periwound skin appearance exhibited: Callus, Dry/Scaly. The periwound skin appearance  did not exhibit: Crepitus, Excoriation, Induration, Rash, Scarring, Maceration, Atrophie Blanche, Cyanosis, Ecchymosis, Hemosiderin Staining, Mottled, Pallor, Rubor, Erythema. 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 There was a Excisional Skin/Subcutaneous Tissue Debridement with a total area of 1.6 sq cm performed by STONE III, Eliza Green E., PA-C. With the following instrument(s): Curette to remove Viable and Non-Viable tissue/material. Material removed includes Eschar and Subcutaneous Tissue and after achieving pain control using Lidocaine 4% Topical Solution. No specimens were taken. A time out was conducted at 11:34, prior to the start of the procedure. A Minimum amount of bleeding was controlled with Pressure. The procedure was tolerated well with a pain level of 0 throughout and a pain level of 0 following the procedure. Post Debridement Measurements: 1.6cm length x 1cm width x 0.2cm depth; 0.251cm^3 volume. Character of Wound/Ulcer Post Debridement is improved. Severity of Tissue Post Debridement is: Fat layer exposed. Post procedure Diagnosis Wound #3: Same as Pre-Procedure Belko, Corleone ([id number]) Wound #4 Pre-procedure diagnosis of Wound #4 is a Venous Leg Ulcer located on the Left,Distal,Lateral Lower Leg .Severity of Tissue Pre Debridement is: Fat layer exposed. There was a Excisional Skin/Subcutaneous Tissue Debridement with a total  area of 2.5 sq cm performed by STONE III, Brannan Cassedy E., PA-C. With the following instrument(s): Curette to remove Viable and Non- Viable tissue/material. Material removed includes Eschar and Subcutaneous Tissue and after achieving pain control using Lidocaine 4% Topical Solution. No specimens were taken. A time out was conducted at 11:36, prior to the start of the procedure. A Minimum amount of bleeding was controlled with Pressure. The procedure was tolerated well with a pain level of 0 throughout and a pain level of 0 following the procedure. Post Debridement Measurements: 2.5cm length x 1cm width x 0.2cm depth; 0.393cm^3 volume. Character of Wound/Ulcer Post Debridement is improved. Severity of Tissue Post Debridement is: Fat layer exposed. Post procedure Diagnosis Wound #4: Same as Pre-Procedure Plan Wound Cleansing: Wound #1 Right,Anterior Lower Leg: Cleanse wound with mild soap and water May Shower, gently pat wound dry prior to applying new dressing. Wound #2 Right,Lateral Lower Leg: Cleanse wound with mild soap and water May Shower, gently pat wound dry prior to applying new dressing. Wound #3 Left,Proximal,Lateral Lower Leg: Cleanse wound with mild soap and water May Shower, gently pat wound dry prior to applying new dressing. Wound #4 Left,Distal,Lateral Lower Leg: Cleanse wound with mild soap and water May Shower, gently pat wound dry prior to applying new dressing. Wound #5 Dorsal Toe Second: Cleanse wound with mild soap and water May Shower, gently pat wound dry prior to applying new dressing. Anesthetic (add to Medication List): Wound #1 Right,Anterior Lower Leg: Topical Lidocaine 4% cream applied to wound bed prior to debridement (In Clinic Only). Wound #2 Right,Lateral Lower Leg: Topical Lidocaine 4% cream applied to wound bed prior to debridement (In Clinic Only). Wound #3 Left,Proximal,Lateral Lower Leg: Topical Lidocaine 4% cream applied to wound bed prior to debridement  (In Clinic Only). Wound #4 Left,Distal,Lateral Lower Leg: Topical Lidocaine 4% cream applied to wound bed prior to debridement (In Clinic Only). Wound #5 Dorsal Toe Second: Topical Lidocaine 4% cream applied to wound bed prior to debridement (In Clinic Only). Primary Wound Dressing: Wound #1 Right,Anterior Lower Leg: Iodoflex Wound #2 Right,Lateral Lower Leg: Iodoflex Wound #3 Left,Proximal,Lateral Lower Leg: Iodoflex Wound #4 Left,Distal,Lateral Lower Leg: Iodoflex  Wound #5 Dorsal Toe Second: Iodoflex Secondary Dressing: Wound #1 Right,Anterior Lower Leg: MANVEER, GOMES ([id number]) ABD pad Wound #2 Right,Lateral Lower Leg: ABD pad Wound #3 Left,Proximal,Lateral Lower Leg: ABD pad Wound #4 Left,Distal,Lateral Lower Leg: ABD pad Wound #5 Dorsal Toe Second: ABD pad Dressing Change Frequency: Wound #1 Right,Anterior Lower Leg: Change Dressing Monday, Wednesday, Friday Wound #2 Right,Lateral Lower Leg: Change Dressing Monday, Wednesday, Friday Wound #3 Left,Proximal,Lateral Lower Leg: Change Dressing Monday, Wednesday, Friday Wound #4 Left,Distal,Lateral Lower Leg: Change Dressing Monday, Wednesday, Friday Wound #5 Dorsal Toe Second: Change Dressing Monday, Wednesday, Friday Follow-up Appointments: Wound #1 Right,Anterior Lower Leg: Return Appointment in 1 week. Wound #2 Right,Lateral Lower Leg: Return Appointment in 1 week. Wound #3 Left,Proximal,Lateral Lower Leg: Return Appointment in 1 week. Wound #4 Left,Distal,Lateral Lower Leg: Return Appointment in 1 week. Wound #5 Dorsal Toe Second: Return Appointment in 1 week. Edema Control: Wound #1 Right,Anterior Lower Leg: Kerlix and Coban - Bilateral - WRAP LEGS LIGHTLY FROM TOES TO 3CM BELOW THE KNEE - PLEASE DO NOT WRAP TOO TIGHT Wound #2 Right,Lateral Lower Leg: Kerlix and Coban - Bilateral - WRAP LEGS LIGHTLY FROM TOES TO 3CM BELOW THE KNEE - PLEASE DO NOT WRAP TOO TIGHT Wound #3 Left,Proximal,Lateral Lower  Leg: Kerlix and Coban - Bilateral - WRAP LEGS LIGHTLY FROM TOES TO 3CM BELOW THE KNEE - PLEASE DO NOT WRAP TOO TIGHT Wound #4 Left,Distal,Lateral Lower Leg: Kerlix and Coban - Bilateral - WRAP LEGS LIGHTLY FROM TOES TO 3CM BELOW THE KNEE - PLEASE DO NOT WRAP TOO TIGHT Wound #5 Dorsal Toe Second: Kerlix and Coban - Bilateral - WRAP LEGS LIGHTLY FROM TOES TO 3CM BELOW THE KNEE - PLEASE DO NOT WRAP TOO TIGHT My suggestion currently is gonna be that we go ahead and initiate the above wound care measures for the next week and the patient is in agreement with plan. Subsequently will see were things stand at follow-up. If anything changes or worsens meantime he let me know I specifically believe that the compression wraps will be of benefit for him. Please see above for specific wound care orders. We will see patient for re-evaluation in 1 week(s) here in the clinic. If anything worsens or changes patient will contact our office for additional recommendations. TIBOR, LEMMONS ([id number]) Electronic Signature(s) Signed: 10/08/2018 5:40:27 PM By: Worthy Keeler PA-C Entered By: Worthy Keeler on 10/08/2018 17:35:53 Chad [name] ([id number]) -------------------------------------------------------------------------------- ROS/PFSH Details Patient Name: Chad [name] Date of Service: 10/08/2018 10:15 AM Medical Record Number: [id number] Patient Account Number: [id number] Date of Birth/Sex: August 25, 1960 (58 y.o. M) Treating RN: Harold Barban Primary Care Provider: Clayborn Bigness Other Clinician: Referring Provider: Charlaine Dalton Treating Provider/Extender: STONE III, Marqueta Pulley Weeks in Treatment: 0 Information Obtained From Patient Wound History Do you currently have one or more open woundso Yes How many open wounds do you currently haveo 3 Approximately how long have you had your woundso 2 months How have you been treating your wound(s) until nowo soap and water Has your wound(s) ever  healed and then re-openedo No Have you had any lab work done in the past montho No Have you tested positive for an antibiotic resistant organism (MRSA, VRE)o No Have you tested positive for osteomyelitis (bone infection)o No Have you had any tests for circulation on your legso Yes Where was the test doneo [facility] Have you had other problems associated with your woundso Swelling Cardiovascular Complaints and Symptoms: Positive for: LE edema Medical History: Negative for: Angina; Arrhythmia; Congestive Heart Failure; Coronary  Artery Disease; Deep Vein Thrombosis; Hypertension; Hypotension; Myocardial Infarction; Peripheral Arterial Disease; Peripheral Venous Disease; Phlebitis; Vasculitis Endocrine Complaints and Symptoms: Positive for: Hepatitis - C Negative for: Thyroid disease; Polydypsia (Excessive Thirst) Medical History: Negative for: Type I Diabetes; Type II Diabetes Immunological Complaints and Symptoms: Positive for: Itching - legs itch, tingling Medical History: Negative for: Lupus Erythematosus; Raynaudos; Scleroderma Integumentary (Skin) Complaints and Symptoms: Positive for: Wounds - 3 Negative for: Bleeding or bruising tendency; Breakdown; Swelling Medical HistoryLINCOLN, GINLEY ([id number]) Negative for: History of Burn; History of pressure wounds Psychiatric Complaints and Symptoms: Positive for: Anxiety Review of System Notes: major depressive disorder Constitutional Symptoms (General Health) Complaints and Symptoms: No Complaints or Symptoms Eyes Complaints and Symptoms: No Complaints or Symptoms Medical History: Negative for: Cataracts; Glaucoma; Optic Neuritis Ear/Nose/Mouth/Throat Complaints and Symptoms: No Complaints or Symptoms Complaints and Symptoms: Review of System Notes: Unable to hear from left ear, patient states it feels clogged. Medical History: Negative for: Chronic sinus problems/congestion; Middle ear  problems Hematologic/Lymphatic Complaints and Symptoms: No Complaints or Symptoms Medical History: Positive for: Anemia Negative for: Hemophilia; Human Immunodeficiency Virus; Lymphedema; Sickle Cell Disease Respiratory Medical History: Positive for: Chronic Obstructive Pulmonary Disease (COPD) Negative for: Aspiration; Asthma; Pneumothorax; Sleep Apnea; Tuberculosis Gastrointestinal Complaints and Symptoms: No Complaints or Symptoms Complaints and Symptoms: Review of System Notes: dysphagia, malignant neoplasm of colon Medical HistoryDETRAVION, TESTER ([id number]) Positive for: Hepatitis C Genitourinary Complaints and Symptoms: Review of System Notes: hydronephrosis with ureteral stricture Medical History: Negative for: End Stage Renal Disease Musculoskeletal Complaints and Symptoms: No Complaints or Symptoms Medical History: Negative for: Gout; Rheumatoid Arthritis; Osteoarthritis; Osteomyelitis Neurologic Complaints and Symptoms: No Complaints or Symptoms Complaints and Symptoms: Review of System Notes: stroke 06/07/2017 left side affected Medical History: Negative for: Dementia; Neuropathy; Quadriplegia; Paraplegia; Seizure Disorder Oncologic Medical History: Positive for: Received Chemotherapy - infusions Immunizations Pneumococcal Vaccine: Received Pneumococcal Vaccination: Yes Implantable Devices None Family and Social History Current every day smoker - 1/2 pack per day; Marital Status - Single; Alcohol Use: Never; Drug Use: No History; Caffeine Use: Daily - cooffee; Financial Concerns: No; Food, Clothing or Shelter Needs: No; Support System Lacking: No; Transportation Concerns: No Electronic Signature(s) Signed: 10/08/2018 5:40:27 PM By: Worthy Keeler PA-C Signed: 10/11/2018 5:03:00 PM By: Harold Barban Entered By: Harold Barban on 10/08/2018 10:27:29 Chad [name]  ([id number]) -------------------------------------------------------------------------------- [place] Details Patient Name: Chad [name] Date of Service: 10/08/2018 Medical Record Number: [id number] Patient Account Number: [id number] Date of Birth/Sex: 1961-02-26 (58 y.o. M) Treating RN: Montey Hora Primary Care Provider: Clayborn Bigness Other Clinician: Referring Provider: Charlaine Dalton Treating Provider/Extender: Melburn Hake, Trase Bunda Weeks in Treatment: 0 Diagnosis Coding ICD-10 Codes Code Description I87.2 Venous insufficiency (chronic) (peripheral) L97.812 Non-pressure chronic ulcer of other part of right lower leg with fat layer exposed L97.822 Non-pressure chronic ulcer of other part of left lower leg with fat layer exposed L97.512 Non-pressure chronic ulcer of other part of right foot with fat layer exposed 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 side Facility Procedures CPT4 Code Description: 93235573 99213 - WOUND CARE VISIT-LEV 3 EST PT Modifier: Quantity: 1 CPT4 Code Description: 22025427 11042 - DEB SUBQ TISSUE 20 SQ CM/< ICD-10 Diagnosis Description L97.822 Non-pressure chronic ulcer of other part of left lower leg with Modifier: fat layer expos Quantity: 1 ed Physician Procedures CPT4 Code Description: [id number] WC PHYS LEVEL 3 o NEW PT ICD-10 Diagnosis Description I87.2 Venous insufficiency (chronic) (peripheral) L97.812 Non-pressure chronic ulcer of other part of right lower  leg with L97.822 Non-pressure chronic ulcer of other  part of left lower leg with L97.512 Non-pressure chronic ulcer of other part of right foot with fat Modifier: 25 fat layer expo fat layer expos layer exposed Quantity: 1 sed ed CPT4 Code Description: 9622297 11042 - WC PHYS SUBQ TISS 20 SQ CM ICD-10 Diagnosis Description L97.822 Non-pressure chronic ulcer of other part of left lower leg with Modifier: fat layer expos Quantity: 1  ed Electronic Signature(s) Signed: 10/08/2018 5:40:27 PM By: Worthy Keeler PA-C Entered By: Worthy Keeler on 10/08/2018 17:36:14

## 2018-10-12 NOTE — Progress Notes (Signed)
JOSECARLOS, HARRIOTT ([id number]) Visit Report for 10/08/2018 Abuse/Suicide Risk Screen Details Patient Name: Chad [name], Chad [name] Date of Service: 10/08/2018 10:15 AM Medical Record Number: [id number] Patient Account Number: [id number] Date of Birth/Sex: [birth year] (58 y.o. M) Treating RN: Harold Barban Primary Care Cynara Tatham: Clayborn Bigness Other Clinician: Referring Deny Chevez: Charlaine Dalton Treating Lakiyah Arntson/Extender: STONE III, HOYT Weeks in Treatment: 0 Abuse/Suicide Risk Screen Items Answer ABUSE/SUICIDE RISK SCREEN: Has anyone close to you tried to hurt or harm you recentlyo No Do you feel uncomfortable with anyone in your familyo No Has anyone forced you do things that you didnot want to doo No Do you have any thoughts of harming yourselfo No Patient displays signs or symptoms of abuse and/or neglect. No Electronic Signature(s) Signed: 10/11/2018 5:03:00 PM By: Harold Barban Entered By: Harold Barban on 10/08/2018 10:27:49 Chad [name] ([id number]) -------------------------------------------------------------------------------- Activities of Daily Living Details Patient Name: Chad [name] Date of Service: 10/08/2018 10:15 AM Medical Record Number: [id number] Patient Account Number: [id number] Date of Birth/Sex: [birth year] (58 y.o. M) Treating RN: Harold Barban Primary Care Diandre Merica: Clayborn Bigness Other Clinician: Referring Annemarie Sebree: Charlaine Dalton Treating Gelene Recktenwald/Extender: STONE III, HOYT Weeks in Treatment: 0 Activities of Daily Living Items Answer Activities of Daily Living (Please select one for each item) Drive Automobile Not Able Take Medications Need Assistance Use Telephone Completely Able Care for Appearance Need Assistance Use Toilet Completely Able Bath / Shower Completely Able Dress Self Completely Able Feed Self Completely Able Walk Need Assistance Get In / Out Bed Completely [place] Need Assistance Shop for Self Need Assistance Electronic Signature(s) Signed: 10/11/2018 5:03:00 PM By: Harold Barban Entered By: Harold Barban on 10/08/2018 10:28:50 Chad [name] ([id number]) -------------------------------------------------------------------------------- Education Assessment Details Patient Name: Chad [name] Date of Service: 10/08/2018 10:15 AM Medical Record Number: [id number] Patient Account Number: [id number] Date of Birth/Sex: 10/12/1960 (58 y.o. M) Treating RN: Harold Barban Primary Care Gahel Safley: Clayborn Bigness Other Clinician: Referring Ashla Murph: Charlaine Dalton Treating Qamar Aughenbaugh/Extender: Melburn Hake, HOYT Weeks in Treatment: 0 Primary Learner Assessed: Patient Learning Preferences/Education Level/Primary Language Learning Preference: Explanation Highest Education Level: High School Preferred Language: English Cognitive Barrier Assessment/Beliefs Language Barrier: No Translator Needed: No Memory Deficit: No Emotional Barrier: No Cultural/Religious Beliefs Affecting Medical Care: No Physical Barrier Assessment Impaired Vision: No Decreased Hand dexterity: No Knowledge/Comprehension Assessment Knowledge Level: Medium Comprehension Level: Medium Ability to understand written Medium instructions: Ability to understand verbal Medium instructions: Motivation Assessment Anxiety Level: Calm Cooperation: Cooperative Education Importance: Acknowledges Need Perception: Coherent Willingness to Engage in Self- High Management Activities: Readiness to Engage in Self- High Management Activities: Electronic Signature(s) Signed: 10/11/2018 5:03:00 PM By: Harold Barban Entered By: Harold Barban on 10/08/2018 10:29:23 Chad [name] ([id number]) -------------------------------------------------------------------------------- Fall Risk Assessment Details Patient Name: Chad [name] Date of Service: 10/08/2018 10:15 AM Medical  Record Number: [id number] Patient Account Number: [id number] Date of Birth/Sex: January 10, 1961 (58 y.o. M) Treating RN: Harold Barban Primary Care Jacquise Rarick: Clayborn Bigness Other Clinician: Referring Alexandra Lipps: Charlaine Dalton Treating Josiephine Simao/Extender: Melburn Hake, HOYT Weeks in Treatment: 0 Fall Risk Assessment Items Have you had 2 or more falls in the last 12 monthso 0 Yes Have you had any fall that resulted in injury in the last 12 monthso 0 No FALL RISK ASSESSMENT: History of falling - immediate or within 3 months 0 No Secondary diagnosis 0 No Ambulatory aid None/bed rest/wheelchair/nurse 0 No Crutches/cane/walker 15 Yes Furniture 0 No IV Access/Saline Lock 0 No Gait/Training Normal/bed rest/immobile 0 No Weak 0 No Impaired 0 No Mental  Status Oriented to own ability 0 Yes Electronic Signature(s) Signed: 10/11/2018 5:03:00 PM By: Harold Barban Entered By: Harold Barban on 10/08/2018 10:29:48 Chad [name] ([id number]) -------------------------------------------------------------------------------- Foot Assessment Details Patient Name: Chad [name] Date of Service: 10/08/2018 10:15 AM Medical Record Number: [id number] Patient Account Number: [id number] Date of Birth/Sex: [birth year] (58 y.o. M) Treating RN: Harold Barban Primary Care Carle Dargan: Clayborn Bigness Other Clinician: Referring Delrico Minehart: Charlaine Dalton Treating Derya Dettmann/Extender: STONE III, HOYT Weeks in Treatment: 0 Foot Assessment Items Site Locations + = Sensation present, - = Sensation absent, C = Callus, U = Ulcer R = Redness, W = Warmth, M = Maceration, PU = Pre-ulcerative lesion F = Fissure, S = Swelling, D = Dryness Assessment Right: Left: Other Deformity: No No Prior Foot Ulcer: No No Prior Amputation: No No Charcot Joint: No No Ambulatory Status: Ambulatory With Help Assistance Device: Walker Gait: Administrator, arts) Signed: 10/11/2018 5:03:00 PM By: Harold Barban Entered By:  Harold Barban on 10/08/2018 10:32:15 Chad [name] ([id number]) -------------------------------------------------------------------------------- Nutrition Risk Assessment Details Patient Name: Chad [name] Date of Service: 10/08/2018 10:15 AM Medical Record Number: [id number] Patient Account Number: [id number] Date of Birth/Sex: [birth year] (58 y.o. M) Treating RN: Harold Barban Primary Care Verlee Pope: Clayborn Bigness Other Clinician: Referring Addam Goeller: Charlaine Dalton Treating Malarie Tappen/Extender: STONE III, HOYT Weeks in Treatment: 0 Height (in): 67 Weight (lbs): 150 Body Mass Index (BMI): 23.5 Nutrition Risk Assessment Items NUTRITION RISK SCREEN: I have an illness or condition that made me change the kind and/or amount of 0 No food I eat I eat fewer than two meals per day 0 No I eat few fruits and vegetables, or milk products 0 No I have three or more drinks of beer, liquor or wine almost every day 0 No I have tooth or mouth problems that make it hard for me to eat 0 No I don't always have enough money to buy the food I need 0 No I eat alone most of the time 0 No I take three or more different prescribed or over-the-counter drugs a day 1 Yes Without wanting to, I have lost or gained 10 pounds in the last six months 0 No I am not always physically able to shop, cook and/or feed myself 0 No Nutrition Protocols Good Risk Protocol Moderate Risk Protocol Electronic Signature(s) Signed: 10/11/2018 5:03:00 PM By: Harold Barban Entered By: Harold Barban on 10/08/2018 10:30:04

## 2018-10-12 NOTE — Progress Notes (Signed)
DOMINIQ, FONTAINE ([id number]) Visit Report for 10/08/2018 Allergy List Details Patient Name: Chad [name], Chad [name] Date of Service: 10/08/2018 10:15 AM Medical Record Number: [id number] Patient Account Number: [id number] Date of Birth/Sex: 1960/12/25 (58 y.o. M) Treating RN: Harold Barban Primary Care Niya Behler: Clayborn Bigness Other Clinician: Referring Riyansh Gerstner: Charlaine Dalton Treating Wendee Hata/Extender: STONE III, HOYT Weeks in Treatment: 0 Allergies Active Allergies penicillin Reaction: rash Severity: Moderate Allergy Notes Electronic Signature(s) Signed: 10/11/2018 5:03:00 PM By: Harold Barban Entered By: Harold Barban on 10/08/2018 10:14:33 Chad [name] ([id number]) -------------------------------------------------------------------------------- Arrival Information Details Patient Name: Chad [name] Date of Service: 10/08/2018 10:15 AM Medical Record Number: [id number] Patient Account Number: [id number] Date of Birth/Sex: 11-Dec-1960 (58 y.o. M) Treating RN: Harold Barban Primary Care Aujanae Mccullum: Clayborn Bigness Other Clinician: Referring Ayrianna Mcginniss: Charlaine Dalton Treating Tydus Sanmiguel/Extender: Suella [place] in Treatment: 0 Visit Information Patient Arrived: Wheel Chair Arrival Time: 09:59 Accompanied By: self Transfer Assistance: None Patient Identification Verified: Yes Secondary Verification Process Completed: Yes Electronic Signature(s) Signed: 10/11/2018 5:03:00 PM By: Harold Barban Entered By: Harold Barban on 10/08/2018 10:11:21 Chad [name] ([id number]) -------------------------------------------------------------------------------- Clinic Level of Care Assessment Details Patient Name: Chad [name] Date of Service: 10/08/2018 10:15 AM Medical Record Number: [id number] Patient Account Number: [id number] Date of Birth/Sex: 06-24-1961 (58 y.o. M) Treating RN: Montey Hora Primary Care Herron Fero: Clayborn Bigness Other Clinician: Referring Nevada Kirchner: Charlaine Dalton Treating Annelyse Rey/Extender: STONE III, HOYT Weeks in Treatment: 0 Clinic Level of Care Assessment Items TOOL 1 Quantity Score []  - Use when EandM and Procedure is performed on INITIAL visit 0 ASSESSMENTS - Nursing Assessment / Reassessment X - General Physical Exam (combine w/ comprehensive assessment (listed just below) when 1 20 performed on new pt. evals) X- 1 25 Comprehensive Assessment (HX, ROS, Risk Assessments, Wounds Hx, etc.) ASSESSMENTS - Wound and Skin Assessment / Reassessment []  - Dermatologic / Skin Assessment (not related to wound area) 0 ASSESSMENTS - Ostomy and/or Continence Assessment and Care []  - Incontinence Assessment and Management 0 []  - 0 Ostomy Care Assessment and Management (repouching, etc.) PROCESS - Coordination of Care X - Simple Patient / Family Education for ongoing care 1 15 []  - 0 Complex (extensive) Patient / Family Education for ongoing care X- 1 10 Staff obtains Programmer, systems, Records, Test Results / Process Orders []  - 0 Staff telephones HHA, Nursing Homes / Clarify orders / etc []  - 0 Routine Transfer to another Facility (non-emergent condition) []  - 0 Routine Hospital Admission (non-emergent condition) X- 1 15 New Admissions / Biomedical engineer / Ordering NPWT, Apligraf, etc. []  - 0 Emergency Hospital Admission (emergent condition) PROCESS - Special Needs []  - Pediatric / Minor Patient Management 0 []  - 0 Isolation Patient Management []  - 0 Hearing / Language / Visual special needs []  - 0 Assessment of Community assistance (transportation, D/C planning, etc.) []  - 0 Additional assistance / Altered mentation []  - 0 Support Surface(s) Assessment (bed, cushion, seat, etc.) Chad [name], Chad [name] ([id number]) INTERVENTIONS - Miscellaneous []  - External ear exam 0 []  - 0 Patient Transfer (multiple staff / Civil Service fast streamer / Similar devices) []  - 0 Simple Staple / Suture removal (25 or less) []  - 0 Complex Staple / Suture removal  (26 or more) []  - 0 Hypo/Hyperglycemic Management (do not check if billed separately) X- 1 15 Ankle / Brachial Index (ABI) - do not check if billed separately Has the patient been seen at the hospital within the last three years: Yes Total Score: 100 Level Of Care: New/Established - Level 3 Electronic Signature(s) Signed: 10/08/2018 5:05:27 PM By: Marjory Lies,  Di Kindle Entered By: Montey Hora on 10/08/2018 11:36:34 Chad [name] ([id number]) -------------------------------------------------------------------------------- Encounter Discharge Information Details Patient Name: Chad [name] Date of Service: 10/08/2018 10:15 AM Medical Record Number: [id number] Patient Account Number: [id number] Date of Birth/Sex: April 20, 1961 (58 y.o. M) Treating RN: Harold Barban Primary Care Baylyn Sickles: Clayborn Bigness Other Clinician: Referring Lodie Waheed: Charlaine Dalton Treating Deanza Upperman/Extender: Melburn Hake, HOYT Weeks in Treatment: 0 Encounter Discharge Information Items Post Procedure Vitals Discharge Condition: Stable Temperature (F): 97.8 Ambulatory Status: Wheelchair Pulse (bpm): 78 Discharge Destination: Home Respiratory Rate (breaths/min): 18 Transportation: Private Auto Blood Pressure (mmHg): 107/60 Accompanied By: self Schedule Follow-up Appointment: Yes Clinical Summary of Care: Electronic Signature(s) Signed: 10/08/2018 3:26:48 PM By: Harold Barban Entered By: Harold Barban on 10/08/2018 15:26:48 Chad [name] ([id number]) -------------------------------------------------------------------------------- Lower Extremity Assessment Details Patient Name: Chad [name] Date of Service: 10/08/2018 10:15 AM Medical Record Number: [id number] Patient Account Number: [id number] Date of Birth/Sex: 09-02-1960 (58 y.o. M) Treating RN: Harold Barban Primary Care Kawana Hegel: Clayborn Bigness Other Clinician: Referring Kyrianna Barletta: Charlaine Dalton Treating Lindsi Bayliss/Extender: STONE III,  HOYT Weeks in Treatment: 0 Edema Assessment Assessed: [Left: No] [Right: No] [Left: Edema] [Right: :] Calf Left: Right: Point of Measurement: 34 cm From Medial Instep 32.5 cm 32 cm Ankle Left: Right: Point of Measurement: 13 cm From Medial Instep 24 cm 23 cm Vascular Assessment Pulses: Dorsalis Pedis Palpable: [Left:Yes] [Right:Yes] Posterior Tibial Palpable: [Left:Yes] [Right:Yes] Extremity colors, hair growth, and conditions: Extremity Color: [Left:Red] [Right:Red] Hair Growth on Extremity: [Left:Yes] [Right:Yes] Temperature of Extremity: [Left:Hot] [Right:Hot] Capillary Refill: [Left:> 3 seconds] [Right:> 3 seconds] Blood Pressure: Brachial: [Left:110] [Right:107] Dorsalis Pedis: 120 [Left:Dorsalis Pedis: 130] Ankle: Posterior Tibial: 140 [Left:Posterior Tibial: 100 1.27] [Right:1.18] Toe Nail Assessment Left: Right: Thick: Yes Yes Discolored: Yes Yes Deformed: Yes Yes Improper Length and Hygiene: Yes Yes Electronic Signature(s) Signed: 10/11/2018 5:03:00 PM By: Harold Barban Entered By: Harold Barban on 10/08/2018 10:41:59 Chad [name] ([id number]) -------------------------------------------------------------------------------- Multi Wound Chart Details Patient Name: Chad [name] Date of Service: 10/08/2018 10:15 AM Medical Record Number: [id number] Patient Account Number: [id number] Date of Birth/Sex: [birth year] (58 y.o. 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 Comorbid History: Anemia, Chronic Obstructive Anemia, Chronic Obstructive Anemia, Chronic Obstructive Pulmonary Disease (COPD), Pulmonary Disease (COPD), Pulmonary Disease (COPD), Hepatitis C, Received Hepatitis C, Received Hepatitis C, Received Chemotherapy Chemotherapy Chemotherapy Date Acquired: 08/28/2018 08/28/2018 08/28/2018 Weeks of Treatment: 0 0 0 Wound Status: Open Open Open Clustered Wound: Yes No No Measurements L x W x D 0.4x0.7x0.1 11.5x1.5x0.1 1.6x1x0.1 (cm) Area (cm) : 0.22 13.548 1.257 Volume (cm) : 0.022 1.355 0.126 % Reduction in Area: 0.00% 0.00% 0.00% % Reduction in Volume: 0.00% 0.00% 0.00% Classification: Full Thickness Without Full Thickness Without Full Thickness Without Exposed Support Structures Exposed Support Structures Exposed Support Structures Exudate Amount: Small Small Small Exudate Type: Purulent Purulent Purulent Exudate Color: yellow, brown, green yellow, brown, green yellow, brown, green Wound Margin: Flat and Intact Flat and Intact Flat and Intact Granulation Amount: None Present (0%) None Present (0%) None Present (0%) Necrotic Amount: Large (67-100%) Large (67-100%) Large (67-100%) Necrotic Chad [name]:  Eschar Eschar Eschar Exposed Structures: Fat Layer (Subcutaneous Fascia: No Fascia: No Chad [name]) Exposed: Yes Fat Layer (Subcutaneous Fat Layer (Subcutaneous Chad [name], Chad [name] ([id number]) Fascia: No Chad [name]) Exposed: No Chad [name]) Exposed: No Tendon: No Tendon: No Tendon: No Muscle: No Muscle: No Muscle: No Joint: No Joint: No Joint: No Bone: No Bone: No Bone: No Epithelialization: None None None Periwound Skin Texture: Excoriation: No Excoriation: No Excoriation: No Induration: No Induration: No Induration: No Callus: No Callus: No Callus: No Crepitus: No Crepitus: No Crepitus: No Rash: No Rash: No Rash: No Scarring: No Scarring:  No Scarring: No Periwound Skin Moisture: Dry/Scaly: Yes Dry/Scaly: Yes Dry/Scaly: Yes Maceration: No Maceration: No Maceration: No Periwound Skin Color: Hemosiderin Staining: Yes Erythema: Yes Erythema: Yes Atrophie Blanche: No Hemosiderin Staining: Yes Hemosiderin Staining: Yes Cyanosis: No Atrophie Blanche: No Atrophie Blanche: No Ecchymosis: No Cyanosis: No Cyanosis: No Erythema: No Ecchymosis: No Ecchymosis: No Mottled: No Mottled: No Mottled: No Pallor: No Pallor: No Pallor: No Rubor: No Rubor: No Rubor: No Erythema Location: N/A Circumferential Circumferential Tenderness on Palpation: Yes No Yes Wound Preparation: Ulcer Cleansing: Ulcer Cleansing: Ulcer Cleansing: Rinsed/Irrigated with Saline Rinsed/Irrigated with Saline Rinsed/Irrigated with Saline Topical Anesthetic Applied: Topical Anesthetic Applied: Topical Anesthetic Applied: Other: lidocaine 4% Other: lidocaine 4% Other: lidocaine 4% Wound Number: 4 5 N/A Photos: N/A Wound Location: Left Lower Leg - Lateral, Distal Toe Second - Dorsal N/A Wounding Event: Gradually Appeared Gradually Appeared N/A Primary Etiology: Venous Leg Ulcer Vasculopathy N/A Comorbid History: Anemia, Chronic Obstructive Anemia, Chronic Obstructive N/A Pulmonary Disease (COPD), Pulmonary Disease (COPD), Hepatitis C, Received Hepatitis C, Received Chemotherapy Chemotherapy Date Acquired: 08/28/2018 08/28/2018 N/A Weeks of Treatment: 0 0 N/A Wound Status: Open Open N/A Clustered Wound: No No N/A Measurements L x W x D 2.5x1x0.1 0.3x0.4x0.1 N/A (cm) Area (cm) : 1.963 0.094 N/A Volume (cm) : 0.196 0.009 N/A % Reduction in Area: N/A 0.00% N/A % Reduction in Volume: N/A 0.00% N/A Classification: Full Thickness Without Partial Thickness N/A Exposed Support Structures Chad [name], Chad [name] ([id number]) Exudate Amount: Small None Present N/A Exudate Type: Purulent N/A N/A Exudate Color: yellow, brown, green N/A N/A Wound Margin:  Flat and Intact Epibole N/A Granulation Amount: None Present (0%) None Present (0%) N/A Necrotic Amount: Large (67-100%) Large (67-100%) N/A Necrotic Chad [name]: Eschar Adherent Slough N/A Exposed Structures: Fascia: No Fascia: No N/A Fat Layer (Subcutaneous Fat Layer (Subcutaneous Chad [name]) Exposed: No Chad [name]) Exposed: No Tendon: No Tendon: No Muscle: No Muscle: No Joint: No Joint: No Bone: No Bone: No Epithelialization: None None N/A Periwound Skin Texture: No Abnormalities Noted Callus: Yes N/A Excoriation: No Induration: No Crepitus: No Rash: No Scarring: No Periwound Skin Moisture: Dry/Scaly: Yes Dry/Scaly: Yes N/A Maceration: No Periwound Skin Color: Erythema: Yes Atrophie Blanche: No N/A Hemosiderin Staining: Yes Cyanosis: No Ecchymosis: No Erythema: No Hemosiderin Staining: No Mottled: No Pallor: No Rubor: No Erythema Location: Circumferential N/A N/A Tenderness on Palpation: No No N/A Wound Preparation: Ulcer Cleansing: Wound Ulcer Cleansing: Wound N/A Cleanser Cleanser Topical Anesthetic Applied: Topical Anesthetic Applied: Other: lidocaine 4% Other: lidocaine 4% Treatment Notes Electronic Signature(s) Signed: 10/08/2018 5:05:27 PM By: Montey Hora Entered By: Montey Hora on 10/08/2018 11:36:15 Chad [name] ([id number]) -------------------------------------------------------------------------------- Multi-Disciplinary Care Plan Details Patient Name: Chad [name] Date of Service: 10/08/2018 10:15 AM Medical Record Number: [id number] Patient Account Number: [id number] Date of Birth/Sex: 21-Jun-1961 (58 y.o. M) Treating RN: Montey Hora Primary Care Anni Hocevar: Clayborn Bigness Other Clinician: Referring Alleene Stoy: Charlaine Dalton Treating Sharese Manrique/Extender: STONE III, HOYT Weeks in Treatment: 0 Active Inactive Abuse /  Safety / Falls / Self Care Management Nursing Diagnoses: Potential for falls Goals: Patient will not experience any injury  related to falls Date Initiated: 10/08/2018 Target Resolution Date: 01/01/2019 Goal Status: Active Interventions: Assess fall risk on admission and as needed Notes: Necrotic Chad [name] Nursing Diagnoses: Impaired Chad [name] integrity related to necrotic/devitalized Chad [name] Goals: Patient/caregiver will verbalize understanding of reason and process for debridement of necrotic Chad [name] Date Initiated: 10/08/2018 Target Resolution Date: 01/01/2019 Goal Status: Active Interventions: Assess patient pain level pre-, during and post procedure and prior to discharge Notes: Nutrition Nursing Diagnoses: Imbalanced nutrition Goals: Patient/caregiver agrees to and verbalizes understanding of need to use nutritional supplements and/or vitamins as prescribed Date Initiated: 10/08/2018 Target Resolution Date: 01/01/2019 Goal Status: Active Interventions: Assess patient nutrition upon admission and as needed per policy Chad [name], Chad [name] ([id number]) Notes: Orientation to the Wound Care Program Nursing Diagnoses: Knowledge deficit related to the wound healing center program Goals: Patient/caregiver will verbalize understanding of the [facility] Date Initiated: 10/08/2018 Target Resolution Date: 01/01/2019 Goal Status: Active Interventions: Provide education on orientation to the wound center Notes: Wound/Skin Impairment Nursing Diagnoses: Impaired Chad [name] integrity Goals: Ulcer/skin breakdown will heal within 14 weeks Date Initiated: 10/08/2018 Target Resolution Date: 01/01/2019 Goal Status: Active Interventions: Assess patient/caregiver ability to obtain necessary supplies Assess patient/caregiver ability to perform ulcer/skin care regimen upon admission and as needed Assess ulceration(s) every visit Notes: Electronic Signature(s) Signed: 10/08/2018 5:05:27 PM By: Montey Hora Entered By: Montey Hora on 10/08/2018 11:38:53 Chad [name]  ([id number]) -------------------------------------------------------------------------------- Pain Assessment Details Patient Name: Chad [name] Date of Service: 10/08/2018 10:15 AM Medical Record Number: [id number] Patient Account Number: [id number] Date of Birth/Sex: 02-14-1961 (58 y.o. M) Treating RN: Harold Barban Primary Care Haley Fuerstenberg: Clayborn Bigness Other Clinician: Referring Randi [employer]: Charlaine Dalton Treating Allsion Nogales/Extender: STONE III, HOYT Weeks in Treatment: 0 Active Problems Location of Pain Severity and Description of Pain Patient Has Paino No Site Locations Pain Management and Medication Current Pain Management: Electronic Signature(s) Signed: 10/11/2018 5:03:00 PM By: Harold Barban Entered By: Harold Barban on 10/08/2018 10:11:42 Chad [name] ([id number]) -------------------------------------------------------------------------------- Patient/Caregiver Education Details Patient Name: Chad [name] Date of Service: 10/08/2018 10:15 AM Medical Record Number: [id number] Patient Account Number: [id number] Date of Birth/Gender: 10/31/1960 (58 y.o. M) Treating RN: Montey Hora Primary Care Physician: Clayborn Bigness Other Clinician: Referring Physician: Charlaine Dalton Treating Physician/Extender: Melburn Hake, HOYT Weeks in Treatment: 0 Education Assessment Education Provided To: Patient and Caregiver SNF nurses Education Topics Provided Wound Debridement: Handouts: Wound Debridement Methods: Explain/Verbal Responses: State content correctly Wound/Skin Impairment: Handouts: Other: signed wound care orders Methods: Printed Electronic Signature(s) Signed: 10/11/2018 5:03:00 PM By: Harold Barban Entered By: Harold Barban on 10/08/2018 15:26:52 Chad [name] ([id number]) -------------------------------------------------------------------------------- Wound Assessment Details Patient Name: Chad [name] Date of Service: 10/08/2018 10:15  AM Medical Record Number: [id number] Patient Account Number: [id number] Date of Birth/Sex: [birth year] (58 y.o. M) Treating RN: Harold Barban Primary Care Ahamed Hofland: Clayborn Bigness Other Clinician: Referring Costa Jha: Charlaine Dalton Treating Viral Schramm/Extender: STONE III, HOYT Weeks in Treatment: 0 Wound Status Wound Number: 1 Primary Venous Leg Ulcer Etiology: Wound Location: Right Lower Leg - Anterior Wound Open Wounding Event: Gradually Appeared Status: Date Acquired: 08/28/2018 Comorbid Anemia, Chronic Obstructive Pulmonary Weeks Of Treatment: 0 History: Disease (COPD), Hepatitis C, Received Clustered Wound: Yes Chemotherapy Photos Wound Measurements Length: (cm) 0.4 Width: (cm) 0.7 Depth: (cm) 0.1 Area: (cm) 0.22 Volume: (cm) 0.022 % Reduction in Area: 0% % Reduction in Volume: 0% Epithelialization: None Tunneling: No Undermining: No Wound Description Full Thickness Without Exposed  Support Foul Odo Classification: Structures Slough/F Wound Margin: Flat and Intact Exudate Small Amount: Exudate Type: Purulent Exudate Color: yellow, brown, green r After Cleansing: No ibrino Yes Wound Bed Granulation Amount: None Present (0%) Exposed Structure Necrotic Amount: Large (67-100%) Fascia Exposed: No Necrotic Quality: Eschar Fat Layer (Subcutaneous Chad [name]) Exposed: Yes Tendon Exposed: No Muscle Exposed: No Joint Exposed: No Bone Exposed: No Chad [name], Chad [name] ([id number]) Periwound Skin Texture Texture Color No Abnormalities Noted: No No Abnormalities Noted: No Callus: No Atrophie Blanche: No Crepitus: No Cyanosis: No Excoriation: No Ecchymosis: No Induration: No Erythema: No Rash: No Hemosiderin Staining: Yes Scarring: No Mottled: No Pallor: No Moisture Rubor: No No Abnormalities Noted: No Dry / Scaly: Yes Temperature / Pain Maceration: No Tenderness on Palpation: Yes Wound Preparation Ulcer Cleansing: Rinsed/Irrigated with Saline Topical  Anesthetic Applied: Other: lidocaine 4%, Treatment Notes Wound #1 (Right, Anterior Lower Leg) Notes iodoflex, ABD, Kerlix and Coban Electronic Signature(s) Signed: 10/11/2018 5:03:00 PM By: Harold Barban Entered By: Harold Barban on 10/08/2018 10:54:59 Chad [name] ([id number]) -------------------------------------------------------------------------------- Wound Assessment Details Patient Name: Chad [name] Date of Service: 10/08/2018 10:15 AM Medical Record Number: [id number] Patient Account Number: [id number] Date of Birth/Sex: 03-Mar-1961 (58 y.o. M) Treating RN: Harold Barban Primary Care Nysia Dell: Clayborn Bigness Other Clinician: Referring Trestin Vences: Charlaine Dalton Treating Dillin Lofgren/Extender: STONE III, HOYT Weeks in Treatment: 0 Wound Status Wound Number: 2 Primary Venous Leg Ulcer Etiology: Wound Location: Right Lower Leg - Lateral Wound Open Wounding Event: Gradually Appeared Status: Date Acquired: 08/28/2018 Comorbid Anemia, Chronic Obstructive Pulmonary Weeks Of Treatment: 0 History: Disease (COPD), Hepatitis C, Received Clustered Wound: No Chemotherapy Photos Wound Measurements Length: (cm) 11.5 Width: (cm) 1.5 Depth: (cm) 0.1 Area: (cm) 13.548 Volume: (cm) 1.355 % Reduction in Area: 0% % Reduction in Volume: 0% Epithelialization: None Tunneling: No Undermining: No Wound Description Full Thickness Without Exposed Support Foul Odo Classification: Structures Slough/F Wound Margin: Flat and Intact Exudate Small Amount: Exudate Type: Purulent Exudate Color: yellow, brown, green r After Cleansing: No ibrino Yes Wound Bed Granulation Amount: None Present (0%) Exposed Structure Necrotic Amount: Large (67-100%) Fascia Exposed: No Necrotic Quality: Eschar Fat Layer (Subcutaneous Chad [name]) Exposed: No Tendon Exposed: No Muscle Exposed: No Joint Exposed: No Bone Exposed: No Chad [name], Chad [name] ([id number]) Periwound Skin Texture Texture  Color No Abnormalities Noted: No No Abnormalities Noted: No Callus: No Atrophie Blanche: No Crepitus: No Cyanosis: No Excoriation: No Ecchymosis: No Induration: No Erythema: Yes Rash: No Erythema Location: Circumferential Scarring: No Hemosiderin Staining: Yes Mottled: No Moisture Pallor: No No Abnormalities Noted: No Rubor: No Dry / Scaly: Yes Maceration: No Wound Preparation Ulcer Cleansing: Rinsed/Irrigated with Saline Topical Anesthetic Applied: Other: lidocaine 4%, Treatment Notes Wound #2 (Right, Lateral Lower Leg) Notes iodoflex, ABD, Kerlix and Coban Electronic Signature(s) Signed: 10/11/2018 5:03:00 PM By: Harold Barban Entered By: Harold Barban on 10/08/2018 10:57:46 Chad [name] ([id number]) -------------------------------------------------------------------------------- Wound Assessment Details Patient Name: Chad [name] Date of Service: 10/08/2018 10:15 AM Medical Record Number: [id number] Patient Account Number: [id number] Date of Birth/Sex: [birth year] (58 y.o. M) Treating RN: Harold Barban Primary Care Armelia Penton: Clayborn Bigness Other Clinician: Referring Dajanique Robley: Charlaine Dalton Treating Tilley Faeth/Extender: STONE III, HOYT Weeks in Treatment: 0 Wound Status Wound Number: 3 Primary Venous Leg Ulcer Etiology: Wound Location: Left Lower Leg - Lateral, Proximal Wound Open Wounding Event: Gradually Appeared Status: Date Acquired: 08/28/2018 Comorbid Anemia, Chronic Obstructive Pulmonary Weeks Of Treatment: 0 History: Disease (COPD), Hepatitis C, Received Clustered Wound: No Chemotherapy Photos Wound Measurements Length: (cm) 1.6 Width: (cm) 1 Depth: (cm) 0.1 Area: (  cm) 1.257 Volume: (cm) 0.126 % Reduction in Area: 0% % Reduction in Volume: 0% Epithelialization: None Tunneling: No Undermining: No Wound Description Full Thickness Without Exposed Support Foul Od Classification: Structures Slough/ Wound Margin: Flat and  Intact Exudate Small Amount: Exudate Type: Purulent Exudate Color: yellow, brown, green or After Cleansing: No Fibrino Yes Wound Bed Granulation Amount: None Present (0%) Exposed Structure Necrotic Amount: Large (67-100%) Fascia Exposed: No Necrotic Quality: Eschar Fat Layer (Subcutaneous Chad [name]) Exposed: No Tendon Exposed: No Muscle Exposed: No Joint Exposed: No Bone Exposed: No Chad [name], Chad [name] ([id number]) Periwound Skin Texture Texture Color No Abnormalities Noted: No No Abnormalities Noted: No Callus: No Atrophie Blanche: No Crepitus: No Cyanosis: No Excoriation: No Ecchymosis: No Induration: No Erythema: Yes Rash: No Erythema Location: Circumferential Scarring: No Hemosiderin Staining: Yes Mottled: No Moisture Pallor: No No Abnormalities Noted: No Rubor: No Dry / Scaly: Yes Maceration: No Temperature / Pain Tenderness on Palpation: Yes Wound Preparation Ulcer Cleansing: Rinsed/Irrigated with Saline Topical Anesthetic Applied: Other: lidocaine 4%, Treatment Notes Wound #3 (Left, Proximal, Lateral Lower Leg) Notes iodoflex, ABD, Kerlix and [employer] Electronic Signature(s) Signed: 10/11/2018 5:03:00 PM By: Harold Barban Entered By: Harold Barban on 10/08/2018 11:00:18 Chad [name] ([id number]) -------------------------------------------------------------------------------- Wound Assessment Details Patient Name: Chad [name] Date of Service: 10/08/2018 10:15 AM Medical Record Number: [id number] Patient Account Number: [id number] Date of Birth/Sex: 1961-05-01 (58 y.o. M) Treating RN: Harold Barban Primary Care Karagan Lehr: Clayborn Bigness Other Clinician: Referring Lajuan Godbee: Charlaine Dalton Treating Yentl Verge/Extender: STONE III, HOYT Weeks in Treatment: 0 Wound Status Wound Number: 4 Primary Venous Leg Ulcer Etiology: Wound Location: Left Lower Leg - Lateral, Distal Wound Open Wounding Event: Gradually Appeared Status: Date Acquired:  08/28/2018 Comorbid Anemia, Chronic Obstructive Pulmonary Weeks Of Treatment: 0 History: Disease (COPD), Hepatitis C, Received Clustered Wound: No Chemotherapy Photos Wound Measurements Length: (cm) 2.5 Width: (cm) 1 Depth: (cm) 0.1 Area: (cm) 1.963 Volume: (cm) 0.196 % Reduction in Area: % Reduction in Volume: Epithelialization: None Tunneling: No Undermining: No Wound Description Full Thickness Without Exposed Support Foul Odo Classification: Structures Slough/F Wound Margin: Flat and Intact Exudate Small Amount: Exudate Type: Purulent Exudate Color: yellow, brown, green r After Cleansing: No ibrino Yes Wound Bed Granulation Amount: None Present (0%) Exposed Structure Necrotic Amount: Large (67-100%) Fascia Exposed: No Necrotic Quality: Eschar Fat Layer (Subcutaneous Chad [name]) Exposed: No Tendon Exposed: No Muscle Exposed: No Joint Exposed: No Bone Exposed: No Chad [name], Chad [name] ([id number]) Periwound Skin Texture Texture Color No Abnormalities Noted: No No Abnormalities Noted: No Erythema: Yes Moisture Erythema Location: Circumferential No Abnormalities Noted: No Hemosiderin Staining: Yes Dry / Scaly: Yes Wound Preparation Ulcer Cleansing: Wound Cleanser Topical Anesthetic Applied: Other: lidocaine 4%, Treatment Notes Wound #4 (Left, Distal, Lateral Lower Leg) Notes iodoflex, ABD, Kerlix and [place] Electronic Signature(s) Signed: 10/11/2018 5:03:00 PM By: Harold Barban Entered By: Harold Barban on 10/08/2018 11:02:04 Chad [name] ([id number]) -------------------------------------------------------------------------------- Wound Assessment Details Patient Name: Chad [name] Date of Service: 10/08/2018 10:15 AM Medical Record Number: [id number] Patient Account Number: [id number] Date of Birth/Sex: 02-Jun-1961 (58 y.o. M) Treating RN: Harold Barban Primary Care Pericles Carmicheal: Clayborn Bigness Other Clinician: Referring Derisha Funderburke: Charlaine Dalton Treating Lavera Vandermeer/Extender: STONE III, HOYT Weeks in Treatment: 0 Wound Status Wound Number: 5 Primary Vasculopathy Etiology: Wound Location: Toe Second - Dorsal Wound Open Wounding Event: Gradually Appeared Status: Date Acquired: 08/28/2018 Comorbid Anemia, Chronic Obstructive Pulmonary Weeks Of Treatment: 0 History: Disease (COPD), Hepatitis C, Received Clustered Wound: No Chemotherapy Photos Wound Measurements Length: (cm) 0.3 Width: (cm) 0.4 Depth: (cm) 0.1 Area: (  cm) 0.094 Volume: (cm) 0.009 % Reduction in Area: 0% % Reduction in Volume: 0% Epithelialization: None Tunneling: No Undermining: No Wound Description Classification: Partial Thickness Foul Odo Wound Margin: Epibole Slough/F Exudate Amount: None Present r After Cleansing: No ibrino Yes Wound Bed Granulation Amount: None Present (0%) Exposed Structure Necrotic Amount: Large (67-100%) Fascia Exposed: No Necrotic Quality: Adherent Slough Fat Layer (Subcutaneous Chad [name]) Exposed: No Tendon Exposed: No Muscle Exposed: No Joint Exposed: No Bone Exposed: No Periwound Skin Texture Texture Color No Abnormalities Noted: No No Abnormalities Noted: No Callus: Yes Atrophie Blanche: No Chad [name], Chad [name] ([id number]) Crepitus: No Cyanosis: No Excoriation: No Ecchymosis: No Induration: No Erythema: No Rash: No Hemosiderin Staining: No Scarring: No Mottled: No Pallor: No Moisture Rubor: No No Abnormalities Noted: No Dry / Scaly: Yes Maceration: No Wound Preparation Ulcer Cleansing: Wound Cleanser Topical Anesthetic Applied: Other: lidocaine 4%, Treatment Notes Wound #5 (Dorsal Toe Second) Notes iodoflex, ABD, Kerlix and [place] Electronic Signature(s) Signed: 10/11/2018 5:03:00 PM By: Harold Barban Entered By: Harold Barban on 10/08/2018 11:04:08 Chad [name] ([id number]) -------------------------------------------------------------------------------- Vitals Details Patient  Name: Chad [name] Date of Service: 10/08/2018 10:15 AM Medical Record Number: [id number] Patient Account Number: [id number] Date of Birth/Sex: 1961-05-04 (58 y.o. M) Treating RN: Harold Barban Primary Care Zyen Triggs: Clayborn Bigness Other Clinician: Referring Tiffancy Moger: Charlaine Dalton Treating Kaicee Scarpino/Extender: STONE III, HOYT Weeks in Treatment: 0 Vital Signs Time Taken: 10:00 Temperature (F): 97.8 Height (in): 67 Pulse (bpm): 78 Source: Stated Respiratory Rate (breaths/min): 18 Weight (lbs): 150 Blood Pressure (mmHg): 107/60 Source: Stated Reference Range: 80 - 120 mg / dl Body Mass Index (BMI): 23.5 Electronic Signature(s) Signed: 10/11/2018 5:03:00 PM By: Harold Barban Entered By: Harold Barban on 10/08/2018 10:12:26

## 2018-10-15 ENCOUNTER — Other Ambulatory Visit: Payer: Self-pay

## 2018-10-15 ENCOUNTER — Encounter: Payer: Medicaid Other | Admitting: Physician Assistant

## 2018-10-15 DIAGNOSIS — E11621 Type 2 diabetes mellitus with foot ulcer: Secondary | ICD-10-CM | POA: Diagnosis not present

## 2018-10-16 NOTE — Progress Notes (Addendum)
SCHYLER, COUNSELL ([id number]) Visit Report for 10/15/2018 Arrival Information Details Patient Name: Chad [name], Chad [name] Date of Service: 10/15/2018 9:45 AM Medical Record Number: [id number] Patient Account Number: [id number] Date of Birth/Sex: [birth year] (58 y.o. Male) Treating RN: Army Melia Primary Care Nathali Vent: Clayborn Bigness Other Clinician: Referring Corrion Stirewalt: Clayborn Bigness Treating Fabienne Nolasco/Extender: Melburn Hake, HOYT Weeks in Treatment: 1 Visit Information History Since Last Visit Added or deleted any medications: No Patient Arrived: Wheel Chair Any new allergies or adverse reactions: No Arrival Time: 09:48 Had a fall or experienced change in No Accompanied By: self activities of daily living that may affect Transfer Assistance: None risk of falls: Signs or symptoms of abuse/neglect since last visito No Hospitalized since last visit: No Implantable device outside of the clinic excluding No cellular tissue based products placed in the center since last visit: Has Dressing in Place as Prescribed: Yes Pain Present Now: Yes Electronic Signature(s) Signed: 10/15/2018 11:22:54 AM By: Army Melia Entered By: Army Melia on 10/15/2018 09:48:32 Chad [name] ([id number]) -------------------------------------------------------------------------------- Clinic Level of Care Assessment Details Patient Name: Chad [name] Date of Service: 10/15/2018 9:45 AM Medical Record Number: [id number] Patient Account Number: [id number] Date of Birth/Sex: May 01, 1961 (58 y.o. Male) Treating RN: Montey Hora Primary Care Raeghan Demeter: Clayborn Bigness Other Clinician: Referring Kashay Cavenaugh: Clayborn Bigness Treating Akil Hoos/Extender: Melburn Hake, HOYT Weeks in Treatment: 1 Clinic Level of Care Assessment Items TOOL 4 Quantity Score []  - Use when only an EandM is performed on FOLLOW-UP visit 0 ASSESSMENTS - Nursing Assessment / Reassessment X - Reassessment of Co-morbidities (includes updates in patient status) 1  10 X- 1 5 Reassessment of Adherence to Treatment Plan ASSESSMENTS - Wound and Skin Assessment / Reassessment []  - Simple Wound Assessment / Reassessment - one wound 0 X- 5 5 Complex Wound Assessment / Reassessment - multiple wounds []  - 0 Dermatologic / Skin Assessment (not related to wound area) ASSESSMENTS - Focused Assessment X - Circumferential Edema Measurements - multi extremities 1 5 []  - 0 Nutritional Assessment / Counseling / Intervention X- 1 5 Lower Extremity Assessment (monofilament, tuning fork, pulses) []  - 0 Peripheral Arterial Disease Assessment (using hand held doppler) ASSESSMENTS - Ostomy and/or Continence Assessment and Care []  - Incontinence Assessment and Management 0 []  - 0 Ostomy Care Assessment and Management (repouching, etc.) PROCESS - Coordination of Care X - Simple Patient / Family Education for ongoing care 1 15 []  - 0 Complex (extensive) Patient / Family Education for ongoing care X- 1 10 Staff obtains Programmer, systems, Records, Test Results / Process Orders []  - 0 Staff telephones HHA, Nursing Homes / Clarify orders / etc []  - 0 Routine Transfer to another Facility (non-emergent condition) []  - 0 Routine Hospital Admission (non-emergent condition) []  - 0 New Admissions / Biomedical engineer / Ordering NPWT, Apligraf, etc. []  - 0 Emergency Hospital Admission (emergent condition) X- 1 10 Simple Discharge Coordination Chad [name], Chad [name] ([id number]) []  - 0 Complex (extensive) Discharge Coordination PROCESS - Special Needs []  - Pediatric / Minor Patient Management 0 []  - 0 Isolation Patient Management []  - 0 Hearing / Language / Visual special needs []  - 0 Assessment of Community assistance (transportation, D/C planning, etc.) []  - 0 Additional assistance / Altered mentation []  - 0 Support Surface(s) Assessment (bed, cushion, seat, etc.) INTERVENTIONS - Wound Cleansing / Measurement []  - Simple Wound Cleansing - one wound 0 X- 5 5 Complex  Wound Cleansing - multiple wounds X- 1 5 Wound Imaging (photographs - any number of wounds) []  - 0 Wound Tracing (instead of photographs) []  -  0 Simple Wound Measurement - one wound X- 5 5 Complex Wound Measurement - multiple wounds INTERVENTIONS - Wound Dressings []  - Small Wound Dressing one or multiple wounds 0 []  - 0 Medium Wound Dressing one or multiple wounds X- 2 20 Large Wound Dressing one or multiple wounds []  - 0 Application of Medications - topical []  - 0 Application of Medications - injection INTERVENTIONS - Miscellaneous []  - External ear exam 0 []  - 0 Specimen Collection (cultures, biopsies, blood, body fluids, etc.) []  - 0 Specimen(s) / Culture(s) sent or taken to Lab for analysis []  - 0 Patient Transfer (multiple staff / Civil Service fast streamer / Similar devices) []  - 0 Simple Staple / Suture removal (25 or less) []  - 0 Complex Staple / Suture removal (26 or more) []  - 0 Hypo / Hyperglycemic Management (close monitor of Blood Glucose) []  - 0 Ankle / Brachial Index (ABI) - do not check if billed separately X- 1 5 Vital Signs Chad [name], Chad [name] ([id number]) Has the patient been seen at the hospital within the last three years: Yes Total Score: 185 Level Of Care: New/Established - Level 5 Electronic Signature(s) Signed: 10/15/2018 3:50:35 PM By: Montey Hora Entered By: Montey Hora on 10/15/2018 10:20:20 Chad [name] ([id number]) -------------------------------------------------------------------------------- Encounter Discharge Information Details Patient Name: Chad [name] Date of Service: 10/15/2018 9:45 AM Medical Record Number: [id number] Patient Account Number: [id number] Date of Birth/Sex: 1960/09/24 (58 y.o. Male) Treating RN: Cornell Barman Primary Care Mohanad Carsten: Clayborn Bigness Other Clinician: Referring Elizer Bostic: Clayborn Bigness Treating Raelan Burgoon/Extender: Melburn Hake, HOYT Weeks in Treatment: 1 Encounter Discharge Information Items Discharge Condition:  Stable Ambulatory Status: Wheelchair Discharge Destination: Home Transportation: Private Auto Accompanied By: self Schedule Follow-up Appointment: Yes Clinical Summary of Care: Electronic Signature(s) Signed: 10/15/2018 11:49:51 AM By: Gretta Cool, BSN, RN, CWS, Kim RN, BSN Entered By: Gretta Cool, BSN, RN, CWS, Kim on 10/15/2018 10:47:35 Chad [name] ([id number]) -------------------------------------------------------------------------------- Lower Extremity Assessment Details Patient Name: Chad [name] Date of Service: 10/15/2018 9:45 AM Medical Record Number: [id number] Patient Account Number: [id number] Date of Birth/Sex: 1960/12/19 (58 y.o. Male) Treating RN: Army Melia Primary Care Yulitza Shorts: Clayborn Bigness Other Clinician: Referring Cailey Trigueros: Clayborn Bigness Treating Furkan Keenum/Extender: Melburn Hake, HOYT Weeks in Treatment: 1 Edema Assessment Assessed: [Left: No] [Right: No] Edema: [Left: No] [Right: No] Vascular Assessment Pulses: Dorsalis Pedis Palpable: [Left:Yes] [Right:Yes] Posterior Tibial Extremity colors, hair growth, and conditions: Extremity Color: [Left:Normal] [Right:Normal] Hair Growth on Extremity: [Left:No] [Right:No] Temperature of Extremity: [Left:Warm] [Right:Warm] Capillary Refill: [Left:< 3 seconds] [Right:< 3 seconds] Toe Nail Assessment Left: Right: Thick: No No Discolored: No No Deformed: No No Improper Length and Hygiene: No No Electronic Signature(s) Signed: 10/15/2018 11:22:54 AM By: Army Melia Entered By: Army Melia on 10/15/2018 10:01:10 Chad [name] ([id number]) -------------------------------------------------------------------------------- Multi Wound Chart Details Patient Name: Chad [name] Date of Service: 10/15/2018 9:45 AM Medical Record Number: [id number] Patient Account Number: [id number] Date of Birth/Sex: 14-Oct-1960 (58 y.o. Male) Treating RN: Montey Hora Primary Care Una Yeomans: Clayborn Bigness Other Clinician: Referring Haeley Fordham:  Clayborn Bigness Treating Ayeisha Lindenberger/Extender: Melburn Hake, HOYT Weeks in Treatment: 1 Vital Signs Height(in): 67 Pulse(bpm): 100 Weight(lbs): 150 Blood Pressure(mmHg): 105/84 Body Mass Index(BMI): 23 Temperature(F): 97.9 Respiratory Rate 16 (breaths/min): Photos: [1:No Photos] [2:No Photos] [3:No Photos] Wound Location: [1:Right, Anterior Lower Leg] [2:Right Lower Leg - Lateral] [3:Left Lower Leg - Lateral, Proximal] Wounding Event: [1:Gradually Appeared] [2:Gradually Appeared] [3:Gradually Appeared] Primary Etiology: [1:Venous Leg Ulcer] [2:Venous Leg Ulcer] [3:Venous Leg Ulcer] Comorbid History: [1:Anemia, Chronic Obstructive Pulmonary Disease (COPD), Hepatitis C, Received Chemotherapy] [2:Anemia, Chronic Obstructive Pulmonary  Disease (COPD), Hepatitis C, Received Chemotherapy] [3:Anemia, Chronic Obstructive Pulmonary Disease  (COPD), Hepatitis C, Received Chemotherapy] Date Acquired: [1:08/28/2018] [2:08/28/2018] [3:08/28/2018] Weeks of Treatment: [1:1] [2:1] [3:1] Wound Status: [1:Healed - Epithelialized] [2:Open] [3:Open] Clustered Wound: [1:Yes] [2:No] [3:No] Measurements L x W x D [1:0x0x0] [2:12x1.4x0.1] [3:2.5x1.3x0.1] (cm) Area (cm) : [1:0] [2:13.195] [3:2.553] Volume (cm) : [1:0] [2:1.319] [3:0.255] % Reduction in Area: [1:100.00%] [2:2.60%] [3:-103.10%] % Reduction in Volume: [1:100.00%] [2:2.70%] [3:-102.40%] Classification: [1:Full Thickness Without Exposed Support Structures] [2:Full Thickness Without Exposed Support Structures] [3:Full Thickness Without Exposed Support Structures] Exudate Amount: [1:Small] [2:Small] [3:Small] Exudate Type: [1:Purulent] [2:Purulent] [3:Purulent] Exudate Color: [1:yellow, brown, green] [2:yellow, brown, green] [3:yellow, brown, green] Wound Margin: [1:Flat and Intact] [2:Flat and Intact] [3:Flat and Intact] Granulation Amount: [1:None Present (0%)] [2:None Present (0%)] [3:None Present (0%)] Granulation Quality: [1:N/A] [2:N/A]  [3:N/A] Necrotic Amount: [1:Large (67-100%)] [2:Large (67-100%)] [3:Large (67-100%)] Necrotic Tissue: [1:Eschar] [2:Eschar] [3:Eschar] Exposed Structures: [1:Fat Layer (Subcutaneous Tissue) Exposed: Yes Fascia: No Tendon: No Muscle: No Joint: No Bone: No] [2:Fascia: No Fat Layer (Subcutaneous Tissue) Exposed: No Tendon: No Muscle: No Joint: No Bone: No] [3:Fascia: No Fat Layer (Subcutaneous Tissue)  Exposed: No Tendon: No Muscle: No Joint: No Bone: No] Epithelialization: [1:None] [2:None] [3:None] Erythema Location: N/A Circumferential Circumferential Wound Preparation: Ulcer Cleansing: Ulcer Cleansing: Ulcer Cleansing: Rinsed/Irrigated with Saline Rinsed/Irrigated with Saline Rinsed/Irrigated with Saline Topical Anesthetic Applied: Topical Anesthetic Applied: Topical Anesthetic Applied: Other: lidocaine 4% Other: lidocaine 4% Other: lidocaine 4% Wound Number: 4 5 N/A Photos: No Photos No Photos N/A Wound Location: Left Lower Leg - Lateral, Distal Toe Second - Dorsal N/A Wounding Event: Gradually Appeared Gradually Appeared N/A Primary Etiology: Venous Leg Ulcer Vasculopathy N/A Comorbid History: Anemia, Chronic Obstructive Anemia, Chronic Obstructive N/A Pulmonary Disease (COPD), Pulmonary Disease (COPD), Hepatitis C, Received Hepatitis C, Received Chemotherapy Chemotherapy Date Acquired: 08/28/2018 08/28/2018 N/A Weeks of Treatment: 1 1 N/A Wound Status: Open Open N/A Clustered Wound: No No N/A Measurements L x W x D 3.5x2.3x0.3 0.3x0.4x0.1 N/A (cm) Area (cm) : 6.322 0.094 N/A Volume (cm) : 1.897 0.009 N/A % Reduction in Area: -222.10% 0.00% N/A % Reduction in Volume: -867.90% 0.00% N/A Classification: Full Thickness Without Partial Thickness N/A Exposed Support Structures Exudate Amount: Small None Present N/A Exudate Type: Purulent N/A N/A Exudate Color: yellow, brown, green N/A N/A Wound Margin: Flat and Intact Epibole N/A Granulation Amount: Small (1-33%) None Present  (0%) N/A Granulation Quality: Pink N/A N/A Necrotic Amount: Large (67-100%) Large (67-100%) N/A Necrotic Tissue: Eschar, Adherent Slough Eschar N/A Exposed Structures: Fascia: No Fascia: No N/A Fat Layer (Subcutaneous Fat Layer (Subcutaneous Tissue) Exposed: No Tissue) Exposed: No Tendon: No Tendon: No Muscle: No Muscle: No Joint: No Joint: No Bone: No Bone: No Epithelialization: None None N/A Erythema Location: Circumferential N/A N/A Wound Preparation: Ulcer Cleansing: Wound Ulcer Cleansing: Wound N/A Cleanser Cleanser Topical Anesthetic Applied: Topical Anesthetic Applied: Other: lidocaine 4% Other: lidocaine 4% Treatment Notes Electronic Signature(s) Signed: 10/15/2018 3:50:35 PM By: Montey Hora Entered By: Montey Hora on 10/15/2018 10:16:25 Chad [name] ([id number]) Chad [name], Chad [name] ([id number]) -------------------------------------------------------------------------------- Multi-Disciplinary Care Plan Details Patient Name: Chad [name] Date of Service: 10/15/2018 9:45 AM Medical Record Number: [id number] Patient Account Number: [id number] Date of Birth/Sex: 02-19-1961 (58 y.o. Male) Treating RN: Montey Hora Primary Care Hser Belanger: Clayborn Bigness Other Clinician: Referring Emersen Mascari: Clayborn Bigness Treating Jerra Huckeby/Extender: Sharalyn Ink in Treatment: 1 Active Inactive Electronic Signature(s) Signed: 01/12/2019 11:38:52 AM By: Gretta Cool, BSN, RN, CWS, Kim RN, BSN Signed: 03/25/2019 1:04:33 PM By: Marjory Lies  Di Kindle Previous Signature: 10/15/2018 3:50:35 PM Version By: Montey Hora Entered By: Gretta Cool BSN, RN, CWS, Kim on 01/12/2019 11:38:51 Chad [name] ([id number]) -------------------------------------------------------------------------------- Pain Assessment Details Patient Name: Chad [name] Date of Service: 10/15/2018 9:45 AM Medical Record Number: [id number] Patient Account Number: [id number] Date of Birth/Sex: 04/12/1961 (58 y.o.  Male) Treating RN: Army Melia Primary Care Leith Szafranski: Clayborn Bigness Other Clinician: Referring Reve Crocket: Clayborn Bigness Treating Satoru Milich/Extender: Melburn Hake, HOYT Weeks in Treatment: 1 Active Problems Location of Pain Severity and Description of Pain Patient Has Paino Yes Site Locations Pain Location: Pain in Ulcers Rate the pain. Current Pain Level: 3 Pain Management and Medication Current Pain Management: Electronic Signature(s) Signed: 10/15/2018 11:22:54 AM By: Army Melia Entered By: Army Melia on 10/15/2018 09:49:15 Chad [name] ([id number]) -------------------------------------------------------------------------------- Patient/Caregiver Education Details Patient Name: Chad [name] Date of Service: 10/15/2018 9:45 AM Medical Record Number: [id number] Patient Account Number: [id number] Date of Birth/Gender: [birth year] (58 y.o. Male) Treating RN: Montey Hora Primary Care Physician: Clayborn Bigness Other Clinician: Referring Physician: Clayborn Bigness Treating Physician/Extender: Sharalyn Ink in Treatment: 1 Education Assessment Education Provided To: Caregiver SNF nurses Education Topics Provided Wound/Skin Impairment: Handouts: Other: written wound care orders Methods: Printed Electronic Signature(s) Signed: 10/15/2018 3:50:35 PM By: Montey Hora Entered By: Montey Hora on 10/15/2018 10:21:10 Chad [name] ([id number]) -------------------------------------------------------------------------------- Wound Assessment Details Patient Name: Chad [name] Date of Service: 10/15/2018 9:45 AM Medical Record Number: [id number] Patient Account Number: [id number] Date of Birth/Sex: 1960-10-25 (58 y.o. Male) Treating RN: Montey Hora Primary Care Nataleah Scioneaux: Clayborn Bigness Other Clinician: Referring Hester Forget: Clayborn Bigness Treating Dieter Hane/Extender: Melburn Hake, HOYT Weeks in Treatment: 1 Wound Status Wound Number: 1 Primary Venous Leg Ulcer Etiology: Wound  Location: Right, Anterior Lower Leg Wound Healed - Epithelialized Wounding Event: Gradually Appeared Status: Date Acquired: 08/28/2018 Comorbid Anemia, Chronic Obstructive Pulmonary Weeks Of Treatment: 1 History: Disease (COPD), Hepatitis C, Received Clustered Wound: Yes Chemotherapy Photos Photo Uploaded By: Army Melia on 10/15/2018 11:04:09 Wound Measurements Length: (cm) 0 % Reduct Width: (cm) 0 % Reduct Depth: (cm) 0 Epitheli Area: (cm) 0 Tunneli Volume: (cm) 0 Undermi ion in Area: 100% ion in Volume: 100% alization: None ng: No ning: No Wound Description Full Thickness Without Exposed Support Foul Odo Classification: Structures Slough/F Wound Margin: Flat and Intact Exudate Small Amount: Exudate Type: Purulent Exudate Color: yellow, brown, green r After Cleansing: No ibrino Yes Wound Bed Granulation Amount: None Present (0%) Exposed Structure Necrotic Amount: Large (67-100%) Fascia Exposed: No Necrotic Quality: Eschar Fat Layer (Subcutaneous Tissue) Exposed: Yes Tendon Exposed: No Muscle Exposed: No Joint Exposed: No Bone Exposed: No Chad [name], Chad [name] ([id number]) Periwound Skin Texture Texture Color No Abnormalities Noted: No No Abnormalities Noted: No Callus: No Atrophie Blanche: No Crepitus: No Cyanosis: No Excoriation: No Ecchymosis: No Induration: No Erythema: No Rash: No Hemosiderin Staining: Yes Scarring: No Mottled: No Pallor: No Moisture Rubor: No No Abnormalities Noted: No Dry / Scaly: Yes Temperature / Pain Maceration: No Tenderness on Palpation: Yes Wound Preparation Ulcer Cleansing: Rinsed/Irrigated with Saline Topical Anesthetic Applied: Other: lidocaine 4%, Electronic Signature(s) Signed: 10/15/2018 3:50:35 PM By: Montey Hora Entered By: Montey Hora on 10/15/2018 10:16:00 Chad [name] ([id number]) -------------------------------------------------------------------------------- Wound Assessment Details Patient  Name: Chad [name] Date of Service: 10/15/2018 9:45 AM Medical Record Number: [id number] Patient Account Number: [id number] Date of Birth/Sex: [birth year] (57 y.o. Male) Treating RN: Army Melia Primary Care Teriann Livingood: Clayborn Bigness Other Clinician: Referring Kila Godina: Clayborn Bigness Treating Ercell Perlman/Extender: Melburn Hake, HOYT Weeks in Treatment: 1 Wound Status Wound Number: 2 Primary Venous  Leg Ulcer Etiology: Wound Location: Right Lower Leg - Lateral Wound Open Wounding Event: Gradually Appeared Status: Date Acquired: 08/28/2018 Comorbid Anemia, Chronic Obstructive Pulmonary Weeks Of Treatment: 1 History: Disease (COPD), Hepatitis C, Received Clustered Wound: No Chemotherapy Photos Photo Uploaded By: Army Melia on 10/15/2018 11:05:44 Wound Measurements Length: (cm) 12 Width: (cm) 1.4 Depth: (cm) 0.1 Area: (cm) 13.195 Volume: (cm) 1.319 % Reduction in Area: 2.6% % Reduction in Volume: 2.7% Epithelialization: None Tunneling: No Undermining: No Wound Description Full Thickness Without Exposed Support Foul Odo Classification: Structures Slough/F Wound Margin: Flat and Intact Exudate Small Amount: Exudate Type: Purulent Exudate Color: yellow, brown, green r After Cleansing: No ibrino Yes Wound Bed Granulation Amount: None Present (0%) Exposed Structure Necrotic Amount: Large (67-100%) Fascia Exposed: No Necrotic Quality: Eschar Fat Layer (Subcutaneous Tissue) Exposed: No Tendon Exposed: No Muscle Exposed: No Joint Exposed: No Bone Exposed: No Chad [name], Chad [name] ([id number]) Periwound Skin Texture Texture Color No Abnormalities Noted: No No Abnormalities Noted: No Callus: No Atrophie Blanche: No Crepitus: No Cyanosis: No Excoriation: No Ecchymosis: No Induration: No Erythema: Yes Rash: No Erythema Location: Circumferential Scarring: No Hemosiderin Staining: Yes Mottled: No Moisture Pallor: No No Abnormalities Noted: No Rubor: No Dry / Scaly:  Yes Maceration: No Temperature / Pain Temperature: No Abnormality Tenderness on Palpation: Yes Wound Preparation Ulcer Cleansing: Rinsed/Irrigated with Saline Topical Anesthetic Applied: Other: lidocaine 4%, Electronic Signature(s) Signed: 10/15/2018 11:22:54 AM By: Army Melia Entered By: Army Melia on 10/15/2018 09:59:40 Chad [name] ([id number]) -------------------------------------------------------------------------------- Wound Assessment Details Patient Name: Chad [name] Date of Service: 10/15/2018 9:45 AM Medical Record Number: [id number] Patient Account Number: [id number] Date of Birth/Sex: 1960-09-03 (58 y.o. Male) Treating RN: Army Melia Primary Care Barack Nicodemus: Clayborn Bigness Other Clinician: Referring Snow Peoples: Clayborn Bigness Treating Tyjuan Demetro/Extender: Melburn Hake, HOYT Weeks in Treatment: 1 Wound Status Wound Number: 3 Primary Venous Leg Ulcer Etiology: Wound Location: Left Lower Leg - Lateral, Proximal Wound Open Wounding Event: Gradually Appeared Status: Date Acquired: 08/28/2018 Comorbid Anemia, Chronic Obstructive Pulmonary Weeks Of Treatment: 1 History: Disease (COPD), Hepatitis C, Received Clustered Wound: No Chemotherapy Photos Photo Uploaded By: Army Melia on 10/15/2018 11:05:44 Wound Measurements Length: (cm) 2.5 Width: (cm) 1.3 Depth: (cm) 0.1 Area: (cm) 2.553 Volume: (cm) 0.255 % Reduction in Area: -103.1% % Reduction in Volume: -102.4% Epithelialization: None Tunneling: No Undermining: No Wound Description Full Thickness Without Exposed Support Foul Od Classification: Structures Slough/ Wound Margin: Flat and Intact Exudate Small Amount: Exudate Type: Purulent Exudate Color: yellow, brown, green or After Cleansing: No Fibrino Yes Wound Bed Granulation Amount: None Present (0%) Exposed Structure Necrotic Amount: Large (67-100%) Fascia Exposed: No Necrotic Quality: Eschar Fat Layer (Subcutaneous Tissue) Exposed: No Tendon  Exposed: No Muscle Exposed: No Joint Exposed: No Bone Exposed: No Chad [name], Chad [name] ([id number]) Periwound Skin Texture Texture Color No Abnormalities Noted: No No Abnormalities Noted: No Callus: No Atrophie Blanche: No Crepitus: No Cyanosis: No Excoriation: No Ecchymosis: No Induration: No Erythema: Yes Rash: No Erythema Location: Circumferential Scarring: No Hemosiderin Staining: Yes Mottled: No Moisture Pallor: No No Abnormalities Noted: No Rubor: No Dry / Scaly: Yes Maceration: No Temperature / Pain Temperature: No Abnormality Tenderness on Palpation: Yes Wound Preparation Ulcer Cleansing: Rinsed/Irrigated with Saline Topical Anesthetic Applied: Other: lidocaine 4%, Electronic Signature(s) Signed: 10/15/2018 11:22:54 AM By: Army Melia Entered By: Army Melia on 10/15/2018 09:59:53 Chad [name] ([id number]) -------------------------------------------------------------------------------- Wound Assessment Details Patient Name: Chad [name] Date of Service: 10/15/2018 9:45 AM Medical Record Number: [id number] Patient Account Number: [id number] Date of Birth/Sex: 05-22-1961 (58 y.o. Male)  Treating RN: Army Melia Primary Care Kacey Dysert: Clayborn Bigness Other Clinician: Referring Attila Mccarthy: Clayborn Bigness Treating Jahmiyah Dullea/Extender: Melburn Hake, HOYT Weeks in Treatment: 1 Wound Status Wound Number: 4 Primary Venous Leg Ulcer Etiology: Wound Location: Left Lower Leg - Lateral, Distal Wound Open Wounding Event: Gradually Appeared Status: Date Acquired: 08/28/2018 Comorbid Anemia, Chronic Obstructive Pulmonary Weeks Of Treatment: 1 History: Disease (COPD), Hepatitis C, Received Clustered Wound: No Chemotherapy Photos Photo Uploaded By: Army Melia on 10/15/2018 11:06:08 Wound Measurements Length: (cm) 3.5 Width: (cm) 2.3 Depth: (cm) 0.3 Area: (cm) 6.322 Volume: (cm) 1.897 % Reduction in Area: -222.1% % Reduction in Volume: -867.9% Epithelialization:  None Tunneling: No Undermining: No Wound Description Full Thickness Without Exposed Support Foul Odo Classification: Structures Slough/F Wound Margin: Flat and Intact Exudate Small Amount: Exudate Type: Purulent Exudate Color: yellow, brown, green r After Cleansing: No ibrino Yes Wound Bed Granulation Amount: Small (1-33%) Exposed Structure Granulation Quality: Pink Fascia Exposed: No Necrotic Amount: Large (67-100%) Fat Layer (Subcutaneous Tissue) Exposed: No Necrotic Quality: Eschar, Adherent Slough Tendon Exposed: No Muscle Exposed: No Joint Exposed: No Bone Exposed: No Chad [name], Chad [name] ([id number]) Periwound Skin Texture Texture Color No Abnormalities Noted: No No Abnormalities Noted: No Erythema: Yes Moisture Erythema Location: Circumferential No Abnormalities Noted: No Hemosiderin Staining: Yes Dry / Scaly: Yes Temperature / Pain Temperature: No Abnormality Tenderness on Palpation: Yes Wound Preparation Ulcer Cleansing: Wound Cleanser Topical Anesthetic Applied: Other: lidocaine 4%, Electronic Signature(s) Signed: 10/15/2018 11:22:54 AM By: Army Melia Entered By: Army Melia on 10/15/2018 10:00:17 Chad [name] ([id number]) -------------------------------------------------------------------------------- Wound Assessment Details Patient Name: Chad [name] Date of Service: 10/15/2018 9:45 AM Medical Record Number: [id number] Patient Account Number: [id number] Date of Birth/Sex: May 17, 1961 (58 y.o. Male) Treating RN: Army Melia Primary Care Jeni Duling: Clayborn Bigness Other Clinician: Referring Erinn Mendosa: Clayborn Bigness Treating Misha Antonini/Extender: Melburn Hake, HOYT Weeks in Treatment: 1 Wound Status Wound Number: [id number] Primary Vasculopathy Etiology: Wound Location: Toe Second - Dorsal Wound Open Wounding Event: Gradually Appeared Status: Date Acquired: 08/28/2018 Comorbid Anemia, Chronic Obstructive Pulmonary Weeks Of Treatment: 1 History: Disease (COPD),  Hepatitis C, Received Clustered Wound: No Chemotherapy Photos Photo Uploaded By: Army Melia on 10/15/2018 11:06:09 Wound Measurements Length: (cm) 0.3 Width: (cm) 0.4 Depth: (cm) 0.1 Area: (cm) 0.094 Volume: (cm) 0.009 % Reduction in Area: 0% % Reduction in Volume: 0% Epithelialization: None Tunneling: No Undermining: No Wound Description Classification: Partial Thickness Foul Odo Wound Margin: Epibole Slough/F Exudate Amount: None Present r After Cleansing: No ibrino Yes Wound Bed Granulation Amount: None Present (0%) Exposed Structure Necrotic Amount: Large (67-100%) Fascia Exposed: No Necrotic Quality: Eschar Fat Layer (Subcutaneous Tissue) Exposed: No Tendon Exposed: No Muscle Exposed: No Joint Exposed: No Bone Exposed: No Periwound Skin Texture Texture Color No Abnormalities Noted: No No Abnormalities Noted: No Chad [name], Chad [name] ([id number]) Callus: Yes Atrophie Blanche: No Crepitus: No Cyanosis: No Excoriation: No Ecchymosis: No Induration: No Erythema: No Rash: No Hemosiderin Staining: No Scarring: No Mottled: No Pallor: No Moisture Rubor: No No Abnormalities Noted: No Dry / Scaly: Yes Temperature / Pain Maceration: No Temperature: No Abnormality Tenderness on Palpation: Yes Wound Preparation Ulcer Cleansing: Wound Cleanser Topical Anesthetic Applied: Other: lidocaine 4%, Electronic Signature(s) Signed: 10/15/2018 11:22:54 AM By: Army Melia Entered By: Army Melia on 10/15/2018 10:00:32 Chad [name] ([id number]) -------------------------------------------------------------------------------- Vitals Details Patient Name: Chad [name] Date of Service: 10/15/2018 9:45 AM Medical Record Number: [id number] Patient Account Number: [id number] Date of Birth/Sex: 10-Sep-1960 (58 y.o. Male) Treating RN: Army Melia Primary Care Jaquilla Woodroof: Clayborn Bigness Other Clinician: Referring Shakyra Mattera: Humphrey Rolls,  FOZIA Treating Gabriellah Rabel/Extender: STONE III,  HOYT Weeks in Treatment: 1 Vital Signs Time Taken: 09:48 Temperature (F): 97.9 Height (in): 67 Pulse (bpm): 100 Weight (lbs): 150 Respiratory Rate (breaths/min): 16 Body Mass Index (BMI): 23.5 Blood Pressure (mmHg): 105/84 Reference Range: 80 - 120 mg / dl Electronic Signature(s) Signed: 10/15/2018 11:22:54 AM By: Army Melia Entered By: Army Melia on 10/15/2018 09:49:26

## 2018-10-16 NOTE — Progress Notes (Signed)
KEYVON, HERTER ([id number]) Visit Report for 10/15/2018 Chief Complaint Document Details Patient Name: Chad [name], Chad [name] Date of Service: 10/15/2018 9:45 AM Medical Record Number: [id number] Patient Account Number: [id number] Date of Birth/Sex: Dec 26, 1960 (58 y.o. Male) Treating RN: Montey Hora Primary Care Provider: Clayborn Bigness Other Clinician: Referring Provider: Clayborn Bigness Treating Provider/Extender: Melburn Hake, Chad [name] Weeks in Treatment: 1 Information Obtained from: Patient Chief Complaint Bilateral LE Ulcers Electronic Signature(s) Signed: 10/15/2018 11:59:00 AM By: Worthy Keeler PA-C Entered By: Worthy Keeler on 10/15/2018 09:56:34 Chad [name] ([id number]) -------------------------------------------------------------------------------- HPI Details Patient Name: Chad [name] Date of Service: 10/15/2018 9:45 AM Medical Record Number: [id number] Patient Account Number: [id number] Date of Birth/Sex: 1960/09/25 (58 y.o. Male) Treating RN: Montey Hora Primary Care Provider: Clayborn Bigness Other Clinician: Referring Provider: Clayborn Bigness Treating Provider/Extender: Melburn Hake, Chad [name] Weeks in Treatment: 1 History of Present Illness HPI Description: 10/08/18 on evaluation today patient actually presents to our office for initial evaluation concerning wounds that he has of the bilateral lower extremities. 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 He also has bilateral venous stasis. He apparently has been homeless currently following discharge from the hospital apparently he has been placed at [facility] which is is a skilled nursing facility locally. Nonetheless fortunately he does not show any signs of infection at this time which is good news. In fact several of the wound actually appears to be showing some signs of  improvement already in my pinion. There are a couple areas in the left leg in particular there likely gonna require some sharp debridement to help clear away some necrotic tissue and help with more sufficient healing. No fevers, chills, nausea, or vomiting noted at this time. 10/15/18 on evaluation today patient actually appears to be doing very well in regard to his bilateral lower extremities. He's been tolerating the dressing changes without complication. Fortunately there does not appear to be any evidence of active infection at this time which is great news. Overall I'm actually very pleased with how this has progressed in just one visits time. Electronic Signature(s) Signed: 10/15/2018 11:59:00 AM By: Worthy Keeler PA-C Entered By: Worthy Keeler on 10/15/2018 10:40:51 Chad [name] ([id number]) -------------------------------------------------------------------------------- Physical Exam Details Patient Name: Chad [name] Date of Service: 10/15/2018 9:45 AM Medical Record Number: [id number] Patient Account Number: [id number] Date of Birth/Sex: 04-24-1961 (58 y.o. Male) Treating RN: Montey Hora Primary Care Provider: Clayborn Bigness Other Clinician: Referring Provider: Clayborn Bigness Treating Provider/Extender: Chad [name], Chad [name] Weeks in Treatment: 1 Constitutional Well-nourished and well-hydrated in no acute distress. Respiratory normal breathing without difficulty. clear to auscultation bilaterally. Cardiovascular regular rate and rhythm with normal S1, S2. Psychiatric this patient is able to make decisions and demonstrates good insight into disease process. Alert and Oriented x 3. pleasant and cooperative. Notes Patient's wound bed currently shows evidence of much better granulation at most sites in a couple of the areas especially on the right anterior shin appear to have completely healed. The swelling is much better as well. No sharp debridement was necessary or required  today. Electronic Signature(s) Signed: 10/15/2018 11:59:00 AM By: Worthy Keeler PA-C Entered By: Worthy Keeler on 10/15/2018 10:41:19 Chad [name] ([id number]) -------------------------------------------------------------------------------- Physician Orders Details Patient Name: Chad [name] Date of Service: 10/15/2018 9:45 AM Medical Record Number: [id number] Patient Account Number: [id number] Date of Birth/Sex: December 27, 1960 (58 y.o. 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 Wound #3 Left,Proximal,Lateral Lower Leg o Cleanse wound with mild soap and water o May Shower, gently pat wound dry prior to applying new dressing. Wound #4 Left,Distal,Lateral Lower Leg o Cleanse wound with mild soap and water o May Shower, gently pat wound dry prior to applying new dressing. Wound #5 Right,Dorsal Toe Second o Cleanse wound with mild soap and water o May Shower, gently pat wound dry prior to applying new dressing. Anesthetic (add to Medication List) Wound #2 Right,Lateral Lower Leg o Topical Lidocaine 4% cream applied to wound bed prior to debridement (In Clinic Only). Wound #3  Left,Proximal,Lateral Lower Leg o Topical Lidocaine 4% cream applied to wound bed prior to debridement (In Clinic Only). Wound #4 Left,Distal,Lateral Lower Leg o Topical Lidocaine 4% cream applied to wound bed prior to debridement (In Clinic Only). Wound #5 Right,Dorsal Toe Second o Topical Lidocaine 4% cream applied to wound bed prior to debridement (In Clinic Only). Primary Wound Dressing Wound #2 Right,Lateral Lower Leg o Iodoflex Wound #3 Left,Proximal,Lateral Lower Leg Chad [name], Chad [name] ([id number]) o Iodoflex Wound #4 Left,Distal,Lateral Lower Leg o Iodoflex Wound #5 Right,Dorsal Toe Second o Iodoflex Secondary Dressing Wound #2 Right,Lateral Lower Leg o ABD pad Wound #3 Left,Proximal,Lateral Lower Leg o ABD pad Wound #4 Left,Distal,Lateral Lower Leg o ABD pad Wound #5 Right,Dorsal Toe Second o ABD pad Dressing Change Frequency Wound #2 Right,Lateral Lower Leg o Change Dressing Monday, Wednesday, Friday Wound #3 Left,Proximal,Lateral Lower Leg o Change Dressing Monday, Wednesday, Friday Wound #4 Left,Distal,Lateral Lower Leg o Change Dressing Monday, Wednesday, Friday Wound #5 Right,Dorsal Toe Second o Change Dressing Monday, Wednesday, Friday Follow-up Appointments Wound #2 Right,Lateral Lower Leg o Return Appointment in 2 weeks. Wound #3 Left,Proximal,Lateral Lower Leg o Return Appointment in 2 weeks. Wound #4 Left,Distal,Lateral Lower Leg o Return Appointment in 2 weeks. Wound #5 Right,Dorsal Toe Second o Return Appointment in 2 weeks. Edema Control Wound #2 Right,Lateral Lower Leg o Kerlix and Coban - Bilateral - WRAP LEGS LIGHTLY FROM TOES TO 3CM BELOW THE KNEE - PLEASE DO NOT WRAP TOO TIGHT Wound #3 Left,Proximal,Lateral Lower Leg o Kerlix and Coban - Bilateral - WRAP LEGS LIGHTLY FROM TOES TO 3CM BELOW THE KNEE - PLEASE DO NOT WRAP TOO TIGHT Pensyl, Pleasant ([id number]) Wound #4 Left,Distal,Lateral Lower Leg o  Kerlix and Coban - Bilateral - WRAP LEGS LIGHTLY FROM TOES TO 3CM BELOW THE KNEE - PLEASE DO NOT WRAP TOO TIGHT Wound #5 Right,Dorsal Toe Second o Kerlix and Coban - Bilateral - WRAP LEGS LIGHTLY FROM TOES TO 3CM BELOW THE KNEE - PLEASE DO NOT WRAP TOO TIGHT Electronic Signature(s) Signed: 10/15/2018 11:59:00 AM By: Worthy Keeler PA-C Signed: 10/15/2018 3:50:35 PM By: Montey Hora Entered By: Montey Hora on 10/15/2018 10:17:15 Chad [name] ([id number]) -------------------------------------------------------------------------------- Problem List Details Patient Name: Chad [name]  Date of Service: 10/15/2018 9:45 AM Medical Record Number: [id number] Patient Account Number: [id number] Date of Birth/Sex: 11-May-1961 (58 y.o. Male) Treating RN: Montey Hora Primary Care Provider: Clayborn Bigness Other Clinician: Referring Provider: Clayborn Bigness Treating Provider/Extender: Melburn Hake, Chad [name] Weeks in Treatment: 1 Active Problems ICD-10 Evaluated Encounter Code Description Active Date Today Diagnosis I87.2 Venous insufficiency (chronic) (peripheral) 10/08/2018 No Yes L97.812 Non-pressure chronic ulcer of other part of right lower leg 10/08/2018 No Yes with fat layer exposed L97.822 Non-pressure chronic ulcer of other part of left lower leg with 10/08/2018 No Yes fat layer exposed L97.512 Non-pressure chronic ulcer of other part of right foot with fat 10/08/2018 No Yes layer exposed 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 Male) Treating RN: Montey Hora Primary Care Provider: Clayborn Bigness Other Clinician: Referring Provider: Clayborn Bigness Treating Provider/Extender: Melburn Hake, Chad [name] Weeks in Treatment: 1 Subjective Chief Complaint Information obtained from Patient Bilateral LE Ulcers History of Present Illness (HPI) 10/08/18 on evaluation today patient actually presents to our office for initial evaluation concerning wounds that he has of the bilateral lower extremities. 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 He also has bilateral venous stasis. He apparently has been homeless currently following discharge from the hospital apparently he has been placed at [facility] which is is a skilled nursing facility locally. Nonetheless fortunately he does not show any signs of infection at this time which is good news. In fact several of the wound actually appears to be showing some signs of improvement already in my pinion. There are a couple areas in the left leg in particular there likely gonna require some sharp debridement to help clear away some necrotic tissue and help with more sufficient healing. No fevers, chills, nausea, or vomiting noted at this time. 10/15/18 on evaluation today patient actually appears to be doing very well in regard to his bilateral lower extremities. He's been tolerating the dressing changes without complication. Fortunately there does not appear to be any evidence of active infection at this time which is great news. Overall I'm actually very pleased with how this has progressed in just one visits time. Patient History Information obtained from Patient. Social History Current every day smoker - 1/2 pack per day, Marital Status - Single,  Alcohol Use - Never, Drug Use - No History, Caffeine Use - Daily - cooffee. Medical History Eyes Denies history of Cataracts, Glaucoma, Optic Neuritis Ear/Nose/Mouth/Throat Denies history of Chronic sinus problems/congestion, Middle ear problems Hematologic/Lymphatic Patient has history of Anemia Denies history of Hemophilia, Human Immunodeficiency Virus, Lymphedema, Sickle Cell Disease Respiratory Patient has history of Chronic Obstructive Pulmonary Disease (COPD) Denies history of Aspiration, Asthma, Pneumothorax, Sleep Apnea, Tuberculosis Cardiovascular Denies history of Angina, Arrhythmia, Congestive Heart Failure, Coronary Artery Disease, Deep Vein Thrombosis, Hypertension, Hypotension, Myocardial Infarction, Peripheral Arterial Disease, Peripheral Venous Disease, Phlebitis, Vasculitis Gastrointestinal Patient has history of Hepatitis C Endocrine Chad [name], Chad [name] ([id number]) Denies history of Type I Diabetes, Type II Diabetes Genitourinary Denies history of End Stage Renal Disease  Immunological Denies history of Lupus Erythematosus, Raynaud s, Scleroderma Integumentary (Skin) Denies history of History of Burn, History of pressure wounds Musculoskeletal Denies history of Gout, Rheumatoid Arthritis, Osteoarthritis, Osteomyelitis Neurologic Denies history of Dementia, Neuropathy, Quadriplegia, Paraplegia, Seizure Disorder Oncologic Patient has history of Received Chemotherapy - infusions Review of Systems (ROS) Constitutional Symptoms (General Health) Denies complaints or symptoms of Fever, Chills. Respiratory The patient has no complaints or symptoms. Cardiovascular The patient has no complaints or symptoms. Psychiatric The patient has no complaints or symptoms. Objective Constitutional Well-nourished and well-hydrated in no acute distress. Vitals Time Taken: 9:48 AM, Height: 67 in, Weight: 150 lbs, BMI: 23.5, Temperature: 97.9 F, Pulse: 100 bpm, Respiratory Rate:  16 breaths/min, Blood Pressure: 105/84 mmHg. Respiratory normal breathing without difficulty. clear to auscultation bilaterally. Cardiovascular regular rate and rhythm with normal S1, S2. Psychiatric this patient is able to make decisions and demonstrates good insight into disease process. Alert and Oriented x 3. pleasant and cooperative. General Notes: Patient's wound bed currently shows evidence of much better granulation at most sites in a couple of the areas especially on the right anterior shin appear to have completely healed. The swelling is much better as well. No sharp debridement was necessary or required today. Integumentary (Hair, Skin) Wound #1 status is Healed - Epithelialized. Original cause of wound was Gradually Appeared. The wound is located on the Right,Anterior Lower Leg. The wound measures 0cm length x 0cm width x 0cm depth; 0cm^2 area and 0cm^3 volume. There Chad [name], Chad [name] ([id number]) is Fat Layer (Subcutaneous Tissue) Exposed exposed. There is no tunneling or undermining noted. There is a small amount of purulent drainage noted. The wound margin is flat and intact. There is no granulation within the wound bed. There is a large (67-100%) amount of necrotic tissue within the wound bed including Eschar. The periwound skin appearance exhibited: Dry/Scaly, Hemosiderin Staining. The periwound skin appearance did not exhibit: Callus, Crepitus, Excoriation, Induration, Rash, Scarring, Maceration, Atrophie Blanche, Cyanosis, Ecchymosis, Mottled, Pallor, Rubor, Erythema. The periwound has tenderness on palpation. Wound #2 status is Open. Original cause of wound was Gradually Appeared. The wound is located on the Right,Lateral Lower Leg. The wound measures 12cm length x 1.4cm width x 0.1cm depth; 13.195cm^2 area and 1.319cm^3 volume. There is no tunneling or undermining noted. There is a small amount of purulent drainage noted. The wound margin is flat and intact. There is no  granulation within the wound bed. There is a large (67-100%) amount of necrotic tissue within the wound bed including Eschar. The periwound skin appearance exhibited: Dry/Scaly, Hemosiderin Staining, Erythema. The periwound skin appearance did not exhibit: Callus, Crepitus, Excoriation, Induration, Rash, Scarring, Maceration, Atrophie Blanche, Cyanosis, Ecchymosis, Mottled, Pallor, Rubor. The surrounding wound skin color is noted with erythema which is circumferential. Periwound temperature was noted as No Abnormality. The periwound has tenderness on palpation. Wound #3 status is Open. Original cause of wound was Gradually Appeared. The wound is located on the Left,Proximal,Lateral Lower Leg. The wound measures 2.5cm length x 1.3cm width x 0.1cm depth; 2.553cm^2 area and 0.255cm^3 volume. There is no tunneling or undermining noted. There is a small amount of purulent drainage noted. The wound margin is flat and intact. There is no granulation within the wound bed. There is a large (67-100%) amount of necrotic tissue within the wound bed including Eschar. The periwound skin appearance exhibited: Dry/Scaly, Hemosiderin Staining, Erythema. The periwound skin appearance did not exhibit: Callus, Crepitus, Excoriation, Induration, Rash, Scarring, Maceration, Atrophie Blanche, Cyanosis, Ecchymosis, Mottled, Pallor, Rubor.  The surrounding wound skin color is noted with erythema which is circumferential. Periwound temperature was noted as No Abnormality. The periwound has tenderness on palpation. Wound #4 status is Open. Original cause of wound was Gradually Appeared. The wound is located on the Left,Distal,Lateral Lower Leg. The wound measures 3.5cm length x 2.3cm width x 0.3cm depth; 6.322cm^2 area and 1.897cm^3 volume. There is no tunneling or undermining noted. There is a small amount of purulent drainage noted. The wound margin is flat and intact. There is small (1-33%) pink granulation within the wound  bed. There is a large (67-100%) amount of necrotic tissue within the wound bed including Eschar and Adherent Slough. The periwound skin appearance exhibited: Dry/Scaly, Hemosiderin Staining, Erythema. The surrounding wound skin color is noted with erythema which is circumferential. Periwound temperature was noted as No Abnormality. The periwound has tenderness on palpation. Wound #5 status is Open. Original cause of wound was Gradually Appeared. The wound is located on the Right,Dorsal Toe Second. The wound measures 0.3cm length x 0.4cm width x 0.1cm depth; 0.094cm^2 area and 0.009cm^3 volume. There is no tunneling or undermining noted. There is a none present amount of drainage noted. The wound margin is epibole. There is no granulation within the wound bed. There is a large (67-100%) amount of necrotic tissue within the wound bed including Eschar. The periwound skin appearance exhibited: Callus, Dry/Scaly. The periwound skin appearance did not exhibit: Crepitus, Excoriation, Induration, Rash, Scarring, Maceration, Atrophie Blanche, Cyanosis, Ecchymosis, Hemosiderin Staining, Mottled, Pallor, Rubor, Erythema. Periwound temperature was noted as No Abnormality. The periwound has tenderness on palpation. 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 Wound #3 Left,Proximal,Lateral Lower Leg: Cleanse wound with mild soap and water May Shower, gently  pat wound dry prior to applying new dressing. Wound #4 Left,Distal,Lateral Lower Leg: Cleanse wound with mild soap and water May Shower, gently pat wound dry prior to applying new dressing. Wound #5 Right,Dorsal Toe Second: Cleanse wound with mild soap and water May Shower, gently pat wound dry prior to applying new dressing. Anesthetic (add to Medication List): Wound #2 Right,Lateral Lower Leg: Topical Lidocaine 4% cream applied to wound bed prior to debridement (In Clinic Only). Wound #3 Left,Proximal,Lateral Lower Leg: Topical Lidocaine 4% cream applied to wound bed prior to debridement (In Clinic Only). Wound #4 Left,Distal,Lateral Lower Leg: Topical Lidocaine 4% cream applied to wound bed prior to debridement (In Clinic Only). Wound #5 Right,Dorsal Toe Second: Topical Lidocaine 4% cream applied to wound bed prior to debridement (In Clinic Only). Primary Wound Dressing: Wound #2 Right,Lateral Lower Leg: Iodoflex Wound #3 Left,Proximal,Lateral Lower Leg: Iodoflex Wound #4 Left,Distal,Lateral Lower Leg: Iodoflex Wound #5 Right,Dorsal Toe Second: Iodoflex Secondary Dressing: Wound #2 Right,Lateral Lower Leg: ABD pad Wound #3 Left,Proximal,Lateral Lower Leg: ABD pad Wound #4 Left,Distal,Lateral Lower Leg: ABD pad Wound #5 Right,Dorsal Toe Second: ABD pad Dressing Change Frequency: Wound #2 Right,Lateral Lower Leg: Change Dressing Monday, Wednesday, Friday Wound #3 Left,Proximal,Lateral Lower Leg: Change Dressing Monday, Wednesday, Friday Wound #4 Left,Distal,Lateral Lower Leg: Change Dressing Monday, Wednesday,  Friday Wound #5 Right,Dorsal Toe Second: Change Dressing Monday, Wednesday, Friday Follow-up Appointments: Wound #2 Right,Lateral Lower Leg: Return Appointment in 2 weeks. Wound #3 Left,Proximal,Lateral Lower Leg: Return Appointment in 2 weeks. Chad [name], Chad [name] ([id number]) Wound #4 Left,Distal,Lateral Lower Leg: Return Appointment in 2 weeks. Wound #5  Right,Dorsal Toe Second: Return Appointment in 2 weeks. Edema Control: Wound #2 Right,Lateral Lower Leg: Kerlix and Coban - Bilateral - WRAP LEGS LIGHTLY FROM TOES TO 3CM BELOW THE KNEE - PLEASE DO NOT WRAP TOO TIGHT Wound #3 Left,Proximal,Lateral Lower Leg: Kerlix and Coban - Bilateral - WRAP LEGS LIGHTLY FROM TOES TO 3CM BELOW THE KNEE - PLEASE DO NOT WRAP TOO TIGHT Wound #4 Left,Distal,Lateral Lower Leg: Kerlix and Coban - Bilateral - WRAP LEGS LIGHTLY FROM TOES TO 3CM BELOW THE KNEE - PLEASE DO NOT WRAP TOO TIGHT Wound #5 Right,Dorsal Toe Second: Kerlix and Coban - Bilateral - WRAP LEGS LIGHTLY FROM TOES TO 3CM BELOW THE KNEE - PLEASE DO NOT WRAP TOO TIGHT I'm gonna suggest that we continue with the above wound care measures for the next week. If anything changes or worsens in the meantime he will contact the office and let me know. Otherwise will see were things stand at follow-up. Please see above for specific wound care orders. We will see patient for re-evaluation in 2 week(s) here in the clinic. If anything worsens or changes patient will contact our office for additional recommendations. Electronic Signature(s) Signed: 10/15/2018 11:59:00 AM By: Worthy Keeler PA-C Entered By: Worthy Keeler on 10/15/2018 10:41:42 Chad [name] ([id number]) -------------------------------------------------------------------------------- ROS/PFSH Details Patient Name: Chad [name] Date of Service: 10/15/2018 9:45 AM Medical Record Number: [id number] Patient Account Number: [id number] Date of Birth/Sex: 1960/11/16 (58 y.o. Male) Treating RN: Montey Hora Primary Care Provider: Clayborn Bigness Other Clinician: Referring Provider: Clayborn Bigness Treating Provider/Extender: Melburn Hake, Chad [name] Weeks in Treatment: 1 Information Obtained From Patient Wound History Do you currently have one or more open woundso Yes How many open wounds do you currently haveo 3 Approximately how long have you had  your woundso 2 months How have you been treating your wound(s) until nowo soap and water Has your wound(s) ever healed and then re-openedo No Have you had any lab work done in the past montho No Have you tested positive for an antibiotic resistant organism (MRSA, VRE)o No Have you tested positive for osteomyelitis (bone infection)o No Have you had any tests for circulation on your legso Yes Where was the test doneo [facility] Have you had other problems associated with your woundso Swelling Constitutional Symptoms (General Health) Complaints and Symptoms: Negative for: Fever; Chills Eyes Medical History: Negative for: Cataracts; Glaucoma; Optic Neuritis Ear/Nose/Mouth/Throat Medical History: Negative for: Chronic sinus problems/congestion; Middle ear problems Hematologic/Lymphatic Medical History: Positive for: Anemia Negative for: Hemophilia; Human Immunodeficiency Virus; Lymphedema; Sickle Cell Disease Respiratory Complaints and Symptoms: No Complaints or Symptoms Medical History: Positive for: Chronic Obstructive Pulmonary Disease (COPD) Negative for: Aspiration; Asthma; Pneumothorax; Sleep Apnea; Tuberculosis Cardiovascular Complaints and Symptoms: No Complaints or Symptoms Chad [name], Chad [name] ([id number]) Medical History: Negative for: Angina; Arrhythmia; Congestive Heart Failure; Coronary Artery Disease; Deep Vein Thrombosis; Hypertension; Hypotension; Myocardial Infarction; Peripheral Arterial Disease; Peripheral Venous Disease; Phlebitis; Vasculitis Gastrointestinal Medical History: Positive for: Hepatitis C Endocrine Medical History: Negative for: Type I Diabetes; Type II Diabetes Genitourinary Medical History: Negative for: End Stage Renal Disease Immunological Medical History: Negative for: Lupus Erythematosus; Raynaudos; Scleroderma Integumentary (Skin) Medical History: Negative for: History of Burn; History of pressure wounds Musculoskeletal Medical  History:  Negative for: Gout; Rheumatoid Arthritis; Osteoarthritis; Osteomyelitis Neurologic Medical History: Negative for: Dementia; Neuropathy; Quadriplegia; Paraplegia; Seizure Disorder Oncologic Medical History: Positive for: Received Chemotherapy - infusions Psychiatric Complaints and Symptoms: No Complaints or Symptoms Immunizations Pneumococcal Vaccine: Received Pneumococcal Vaccination: Yes Implantable Devices None Family and Social History Chad [name], Chad [name] ([id number]) Current every day smoker - 1/2 pack per day; Marital Status - Single; Alcohol Use: Never; Drug Use: No History; Caffeine Use: Daily - cooffee; Financial Concerns: No; Food, Clothing or Shelter Needs: No; Support System Lacking: No; Transportation Concerns: No Physician Affirmation I have reviewed and agree with the above information. Electronic Signature(s) Signed: 10/15/2018 11:59:00 AM By: Worthy Keeler PA-C Signed: 10/15/2018 3:50:35 PM By: Montey Hora Entered By: Worthy Keeler on 10/15/2018 10:41:07 Chad [name] ([id number]) -------------------------------------------------------------------------------- SuperBill Details Patient Name: Chad [name] Date of Service: 10/15/2018 Medical Record Number: [id number] Patient Account Number: [id number] Date of Birth/Sex: Nov 13, 1960 (58 y.o. 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 Facility Procedures CPT4 Code: 24235361 Description: 44315 - WOUND CARE VISIT-LEV 5 EST PT Modifier: Quantity: 1 Physician Procedures CPT4 Code Description: 4008676 19509 - WC PHYS LEVEL 4 - EST PT ICD-10 Diagnosis Description I87.2 Venous insufficiency (chronic) (peripheral) L97.812 Non-pressure chronic ulcer of other part of right lower leg with L97.822 Non-pressure chronic ulcer of  other part of left lower leg with L97.512 Non-pressure chronic ulcer of other part of right foot with fat Modifier: fat layer expo fat layer expos layer exposed Quantity: 1 sed ed Electronic Signature(s) Signed: 10/15/2018 11:59:00 AM By: Worthy Keeler PA-C Previous Signature: 10/15/2018 10:20:29 AM Version By: Montey Hora Entered By: Worthy Keeler on 10/15/2018 10:42:11

## 2018-10-19 ENCOUNTER — Non-Acute Institutional Stay: Payer: Medicaid Other | Admitting: Nurse Practitioner

## 2018-10-19 ENCOUNTER — Encounter: Payer: Self-pay | Admitting: Nurse Practitioner

## 2018-10-19 VITALS — BP 130/84 | HR 80 | Temp 98.7°F | Resp 18 | Ht 67.0 in | Wt 144.5 lb

## 2018-10-19 DIAGNOSIS — R630 Anorexia: Secondary | ICD-10-CM

## 2018-10-19 DIAGNOSIS — Z515 Encounter for palliative care: Secondary | ICD-10-CM

## 2018-10-19 DIAGNOSIS — R531 Weakness: Secondary | ICD-10-CM

## 2018-10-19 NOTE — Progress Notes (Signed)
[place] Consult Note Telephone: [phone]  Fax: [phone]  PATIENT NAME: Jaivon Vanbeek DOB: 05/29/1961 MRN: [id number]  PRIMARY CARE PROVIDER:   [name], MD  REFERRING PROVIDER:  Dr [facility] RESPONSIBLE PARTY:   Self  RECOMMENDATIONS and PLAN:  1.Anorexia R63.0 secondary to protein calorie malnutritionrelated to cancerimprovingongoing.Continue to monitor daily weights, supplements, supportive measures and courage to eat  2.Generalized weaknessR53.1 secondary to multiple chronic disease progression including anemia, late onset CVA, urothelial and colon cancer.Encourage energy conservation and rest times.  3. Palliative care encounter Z51.5; Palliative medicine team will continue to support patient, patient's family, and medical team. Visit consisted of counseling and education dealing with the complex and emotionally intense issues of symptom management and palliative care in the setting of serious and potentially life-threatening illness   ASSESSMENT:     I visited and observed Mr Biondolillo. We talked about purpose of palliative care visit with consent obtained. We talked about how he's feeling today. He talked about the frustration of having to be limited to his room. He does go to the smoking patio. We talked about symptoms of pain, shortness of breath. We talked about appetite which he shares is slightly improving. We talked about going to the wound care center as well as the cancer center. He talked about challenges residing at skilled [place]. We talked about his cancer diagnosis. We talked about his expectations. We talked about role of palliative care and plan of care. he is a DNR. Therapeutic listening and emotional support provided. Will continue current plan of care. I have updated nursing staff. No new changes to goals.  3 / 23 / 2020 weight 144.5 lb BMI 22.6   I spent 45 minutes providing this consultation,  from 2:45pm to 3:30pm. More than 50% of the time in this consultation was spent coordinating communication.   HISTORY OF PRESENT ILLNESS:  Kashaun Bebo is a 58 y.o. year old male with multiple medical problems including late onset CVA, urothelial cancer, prostate cancer, chronic hepatitis C, hyperlipidemia, colon surgery, protein calorie malnutrition, anxiety, depression. Continue to reside in skilled long-term care facility. He does transfer to the wheelchair and is able to be mobile propelling himself with his feet. He does require assistance for adl's. He is able to feed himself and appetite does remain poor. He does go to the wound care center for multiple wounds on his lower extremities. He continues to be followed by oncology Dr Patrecia Pace continuing with [name]. He was referred to Orthopedic for left knee pain. He is followed by psychotherapy as well Psychiatry. Last Psychiatry visit 2 / 27 / 2024 where he is prescribed escitalopram for mood with Remeron for appetite and mood. He was GDR off Seroquel without any difficulties and behaviors and appetite has improved. No changes and medications at this visit. He is able to verbalize his needs. He is a DNR. At present he is sitting in the wheelchair in his room. Palliative Care was asked to help address goals of care.   CODE STATUS: DNR  PPS: 50% HOSPICE ELIGIBILITY/DIAGNOSIS: TBD  PAST MEDICAL HISTORY:  Past Medical History:  Diagnosis Date  . Anemia   . Anxiety   . ARF (acute respiratory failure) ([place])   . Bladder cancer ([place])   . Cancer [facility])    prostate  . COPD (chronic obstructive pulmonary disease) ([place])   . Depression   . Dysphagia   . GERD (gastroesophageal reflux  disease)   . Hepatitis    chronic hep c  . Hyperlipidemia   . Malignant neoplasm of colon ([place])   . Nerve pain   . Stroke [facility])     SOCIAL HX:  Social History   Tobacco Use  . Smoking status: Current Every  Day Smoker    Packs/day: 1.00    Types: Cigarettes  . Smokeless tobacco: Never Used  Substance Use Topics  . Alcohol use: Not Currently    Frequency: Never    ALLERGIES:  Allergies  Allergen Reactions  . Penicillins Rash     PERTINENT MEDICATIONS:  Outpatient Encounter Medications as of 10/19/2018  Medication Sig  . acetaminophen (TYLENOL) 325 MG tablet Take 650 mg by mouth 3 (three) times daily.   . diclofenac sodium (VOLTAREN) 1 % GEL Apply 2 g topically See admin instructions. Apply 2 g topically 4 times daily. Apply 2 g to left shoulder, apply 2 g to left hip and 2 g to left knee 4 times daily per [facility] instructions  . escitalopram (LEXAPRO) 20 MG tablet Take 1 tablet (20 mg total) by mouth daily.  Marland Kitchen gabapentin (NEURONTIN) 100 MG capsule Take one to 2 tabs for pain 3 x day (Patient taking differently: Take 100 mg by mouth 3 (three) times daily. )  . mirtazapine (REMERON) 7.5 MG tablet Take 7.5 mg by mouth at bedtime.  Marland Kitchen oxyCODONE (OXY IR/ROXICODONE) 5 MG immediate release tablet Take 5 mg by mouth every 6 (six) hours.   . pantoprazole (PROTONIX) 40 MG tablet Take 40 mg by mouth daily.   No facility-administered encounter medications on file as of 10/19/2018.     PHYSICAL EXAM:   General: NAD, frail appearing, thin pleasant male Cardiovascular: regular rate and rhythm Pulmonary: clear ant fields Abdomen: soft, nontender, + bowel sounds GU: no suprapubic tenderness Extremities: no edema, no joint deformities Skin: no rashes Neurological: Weakness but otherwise nonfocal  Robinn Overholt Ihor Gully, NP

## 2018-10-20 ENCOUNTER — Other Ambulatory Visit: Payer: Self-pay

## 2018-10-22 ENCOUNTER — Other Ambulatory Visit: Payer: Self-pay

## 2018-10-25 ENCOUNTER — Other Ambulatory Visit: Payer: Self-pay

## 2018-10-25 ENCOUNTER — Inpatient Hospital Stay: Payer: Medicaid Other

## 2018-10-25 ENCOUNTER — Inpatient Hospital Stay (HOSPITAL_BASED_OUTPATIENT_CLINIC_OR_DEPARTMENT_OTHER): Payer: Medicaid Other | Admitting: Internal Medicine

## 2018-10-25 ENCOUNTER — Encounter: Payer: Self-pay | Admitting: Internal Medicine

## 2018-10-25 VITALS — BP 97/68 | HR 80 | Temp 97.5°F | Resp 16

## 2018-10-25 DIAGNOSIS — I739 Peripheral vascular disease, unspecified: Secondary | ICD-10-CM

## 2018-10-25 DIAGNOSIS — Z515 Encounter for palliative care: Secondary | ICD-10-CM

## 2018-10-25 DIAGNOSIS — C661 Malignant neoplasm of right ureter: Secondary | ICD-10-CM

## 2018-10-25 DIAGNOSIS — R531 Weakness: Secondary | ICD-10-CM

## 2018-10-25 DIAGNOSIS — B182 Chronic viral hepatitis C: Secondary | ICD-10-CM

## 2018-10-25 DIAGNOSIS — Z5112 Encounter for antineoplastic immunotherapy: Secondary | ICD-10-CM | POA: Diagnosis not present

## 2018-10-25 DIAGNOSIS — J449 Chronic obstructive pulmonary disease, unspecified: Secondary | ICD-10-CM

## 2018-10-25 DIAGNOSIS — R161 Splenomegaly, not elsewhere classified: Secondary | ICD-10-CM

## 2018-10-25 DIAGNOSIS — R5383 Other fatigue: Secondary | ICD-10-CM

## 2018-10-25 DIAGNOSIS — R600 Localized edema: Secondary | ICD-10-CM

## 2018-10-25 DIAGNOSIS — Z79899 Other long term (current) drug therapy: Secondary | ICD-10-CM

## 2018-10-25 DIAGNOSIS — E785 Hyperlipidemia, unspecified: Secondary | ICD-10-CM

## 2018-10-25 DIAGNOSIS — D509 Iron deficiency anemia, unspecified: Secondary | ICD-10-CM | POA: Diagnosis not present

## 2018-10-25 DIAGNOSIS — C189 Malignant neoplasm of colon, unspecified: Secondary | ICD-10-CM | POA: Diagnosis not present

## 2018-10-25 DIAGNOSIS — K7460 Unspecified cirrhosis of liver: Secondary | ICD-10-CM

## 2018-10-25 DIAGNOSIS — R5381 Other malaise: Secondary | ICD-10-CM

## 2018-10-25 DIAGNOSIS — Z9221 Personal history of antineoplastic chemotherapy: Secondary | ICD-10-CM

## 2018-10-25 DIAGNOSIS — Z8673 Personal history of transient ischemic attack (TIA), and cerebral infarction without residual deficits: Secondary | ICD-10-CM

## 2018-10-25 DIAGNOSIS — F17210 Nicotine dependence, cigarettes, uncomplicated: Secondary | ICD-10-CM

## 2018-10-25 DIAGNOSIS — K219 Gastro-esophageal reflux disease without esophagitis: Secondary | ICD-10-CM

## 2018-10-25 DIAGNOSIS — Z791 Long term (current) use of non-steroidal anti-inflammatories (NSAID): Secondary | ICD-10-CM

## 2018-10-25 LAB — COMPREHENSIVE METABOLIC PANEL
ALT: 10 U/L (ref 0–44)
AST: 21 U/L (ref 15–41)
Albumin: 3.4 g/dL — ABNORMAL LOW (ref 3.5–5.0)
Alkaline Phosphatase: 93 U/L (ref 38–126)
Anion gap: 8 (ref 5–15)
BUN: 17 mg/dL (ref 6–20)
CO2: 23 mmol/L (ref 22–32)
Calcium: 8.6 mg/dL — ABNORMAL LOW (ref 8.9–10.3)
Chloride: 106 mmol/L (ref 98–111)
Creatinine, Ser: 0.95 mg/dL (ref 0.61–1.24)
GFR calc Af Amer: >60 mL/min (ref >60)
GFR calc non Af Amer: >60 mL/min (ref >60)
Glucose, Bld: 105 mg/dL — ABNORMAL HIGH (ref 70–99)
Potassium: 4 mmol/L (ref 3.5–5.1)
Sodium: 137 mmol/L (ref 135–145)
Total Bilirubin: 0.5 mg/dL (ref 0.3–1.2)
Total Protein: 7.6 g/dL (ref 6.5–8.1)

## 2018-10-25 LAB — CBC WITH DIFFERENTIAL/PLATELET
Abs Immature Granulocytes: 0.01 10*3/uL (ref 0.00–0.07)
Basophils Absolute: 0.1 10*3/uL (ref 0.0–0.1)
Basophils Relative: 1 %
Eosinophils Absolute: 0.4 10*3/uL (ref 0.0–0.5)
Eosinophils Relative: 7 %
HCT: 39.4 % (ref 39.0–52.0)
Hemoglobin: 12.4 g/dL — ABNORMAL LOW (ref 13.0–17.0)
Immature Granulocytes: 0 %
Lymphocytes Relative: 14 %
Lymphs Abs: 0.9 10*3/uL (ref 0.7–4.0)
MCH: 25.7 pg — ABNORMAL LOW (ref 26.0–34.0)
MCHC: 31.5 g/dL (ref 30.0–36.0)
MCV: 81.7 fL (ref 80.0–100.0)
Monocytes Absolute: 0.4 10*3/uL (ref 0.1–1.0)
Monocytes Relative: 7 %
Neutro Abs: 4.3 10*3/uL (ref 1.7–7.7)
Neutrophils Relative %: 71 %
Platelets: 189 10*3/uL (ref 150–400)
RBC: 4.82 MIL/uL (ref 4.22–5.81)
RDW: 16.1 % — ABNORMAL HIGH (ref 11.5–15.5)
WBC: 6.1 10*3/uL (ref 4.0–10.5)
nRBC: 0 % (ref 0.0–0.2)

## 2018-10-25 MED ORDER — SODIUM CHLORIDE 0.9 % IV SOLN
200.0000 mg | Freq: Once | INTRAVENOUS | Status: AC
  Administered 2018-10-25: 200 mg via INTRAVENOUS
  Filled 2018-10-25: qty 8

## 2018-10-25 MED ORDER — SODIUM CHLORIDE 0.9 % IV SOLN
Freq: Once | INTRAVENOUS | Status: AC
  Administered 2018-10-25: 11:00:00 via INTRAVENOUS
  Filled 2018-10-25: qty 250

## 2018-10-25 NOTE — Addendum Note (Signed)
Addended by: Sandria Bales B on: 10/25/2018 11:47 AM   Modules accepted: Orders

## 2018-10-25 NOTE — Assessment & Plan Note (Addendum)
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  08/19/2018- CT C/A/P- "mild progression" of adenopathy within the chest and pelvis.  Clinically stable.   # Proceed with cycle #7 of keytruda today. Labs today reviewed;  acceptable for treatment today. Will get scan in 4-5 weeks; will order at next visit.   #Colon cancer.  Stage IIII- stable.  #Iron deficiency anemia-hemoglobin 13. Stable.   #Left knee pain/arthritic pain-STABLE; . Awaiting ortho referral.   #Cirrhosis/ spleenomegaly-Hepatitis C-positive/HCV RNA positive.  Stable.  # Bilateral LE ulcers-s/p wound care evaluation.   #DISPOSITION:  Chad [name] today. # follow up 3 weeks for labs/MD/Keytruda-Dr.B

## 2018-10-25 NOTE — Progress Notes (Signed)
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; # Colon ca- stage III  GOALS: palliative  CURRENT/MOST RECENT THERAPY: Keytruda      Urothelial cancer ([place])    Initial Diagnosis    Urothelial cancer ([place])     Ureteral cancer, right ([place])   05/26/2018 Initial Diagnosis    Ureteral cancer, right ([place])    06/21/2018 -  Chemotherapy    The patient had pembrolizumab (KEYTRUDA) 200 mg in sodium chloride 0.9 % 50 mL chemo infusion, 200 mg, Intravenous, Once, 6 of 8 cycles Administration: 200 mg (06/21/2018), 200 mg (07/12/2018), 200 mg (08/02/2018), 200 mg (08/23/2018), 200 mg (09/13/2018), 200 mg (10/04/2018)  for chemotherapy treatment.       HISTORY OF PRESENTING ILLNESS: Patient is a poor historian.  Is alone. Chad [name] 58 y.o.  male above multiple medical problems-urothelial cancer/question advanced currently on Keytruda is here for follow-up.  In the interim patient was evaluated at wound care clinic for his lower extremity ulcers.  Patient had debridement.  Currently is bandaged.  He continues to have left knee pain which is not any worse.  Awaiting to be evaluated by orthopedics.  No fever no chills.  Review of Systems  Constitutional: Positive for malaise/fatigue. Negative for chills, diaphoresis, fever and weight loss.  HENT: Negative for nosebleeds and sore throat.   Eyes: Negative for double vision.  Respiratory: Positive for cough and  shortness of breath. Negative for hemoptysis and wheezing.   Cardiovascular: Positive for leg swelling. Negative for chest pain, palpitations and orthopnea.  Gastrointestinal: Negative for abdominal pain, blood in stool, constipation, diarrhea, heartburn, melena, nausea and vomiting.  Genitourinary: Negative for dysuria, frequency and urgency.  Musculoskeletal: Positive for back pain and joint pain.  Skin: Negative.  Negative for itching and rash.  Neurological: Positive for focal weakness. Negative for dizziness, tingling, weakness and headaches.       Chronic left-sided weakness upper than lower extremity.  Endo/Heme/Allergies: Does not bruise/bleed easily.  Psychiatric/Behavioral: Negative for depression. The patient is not nervous/anxious and does not have insomnia.      MEDICAL HISTORY:  Past Medical History:  Diagnosis Date  . Anemia   . Anxiety   . ARF (acute respiratory failure) ([place])   . Bladder cancer ([place])   . Cancer [facility])    prostate  . COPD (chronic obstructive pulmonary disease) ([place])   . Depression   . Dysphagia   . GERD (gastroesophageal reflux disease)   . Hepatitis    chronic hep c  . Hyperlipidemia   . Malignant neoplasm of colon ([place])   . Nerve pain   . Stroke [facility])     SURGICAL HISTORY: Past Surgical History:  Procedure Laterality Date  . COLON SURGERY     En bloc extended right hemicolectomy 07/2017  . CYSTOSCOPY W/ RETROGRADES Right 08/30/2018   Procedure: CYSTOSCOPY WITH RETROGRADE PYELOGRAM;  Surgeon: Hollice Espy, MD;  Location: [facility] ORS;  Service: Urology;  Laterality: Right;  .  CYSTOSCOPY WITH STENT PLACEMENT Right 04/25/2018   Procedure: CYSTOSCOPY WITH STENT PLACEMENT;  Surgeon: Hollice Espy, MD;  Location: [facility] ORS;  Service: Urology;  Laterality: Right;  . CYSTOSCOPY WITH STENT PLACEMENT Right 08/30/2018   Procedure: [place] WITH STENT Exchange;  Surgeon: Hollice Espy, MD;  Location: [facility] ORS;  Service: Urology;  Laterality: Right;  .  tumor removed       SOCIAL HISTORY: Social History   Socioeconomic History  . Marital status: Single    Spouse name: Not on file  . Number of children: Not on file  . Years of education: Not on file  . Highest education level: Not on file  Occupational History  . Not on file  Social Needs  . Financial resource strain: Not on file  . Food insecurity:    Worry: Not on file    Inability: Not on file  . Transportation needs:    Medical: Not on file    Non-medical: Not on file  Tobacco Use  . Smoking status: Current Every Day Smoker    Packs/day: 1.00    Types: Cigarettes  . Smokeless tobacco: Never Used  Substance and Sexual Activity  . Alcohol use: Not Currently    Frequency: Never  . Drug use: Not Currently  . Sexual activity: Not on file  Lifestyle  . Physical activity:    Days per week: Not on file    Minutes per session: Not on file  . Stress: Not on file  Relationships  . Social connections:    Talks on phone: Not on file    Gets together: Not on file    Attends religious service: Not on file    Active member of club or organization: Not on file    Attends meetings of clubs or organizations: Not on file    Relationship status: Not on file  . Intimate partner violence:    Fear of current or ex partner: Not on file    Emotionally abused: Not on file    Physically abused: Not on file    Forced sexual activity: Not on file  Other Topics Concern  . Not on file  Social History Narrative    used to live [place]; moved  To [place]- end of April 2019; in Nursing home; 1pp/day; quit alcohol. 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        Family- dad- prostate ca [at 17y]; brother- 41 died of prostate cancer; brother- 35- no cancers [[place]]; sonGerald Stabs [[place]];[name]-32y prostate ca [facility] [place]]; daughter- 8 [[place]]; another daughter 57 [[place]/addict]. will refer genetics counseling. Given MSI- abnormal; highly suspicious of Lynch syndrome.  Patient's son [name] aware of high possible  lynch syndrome.    FAMILY HISTORY: Family History  Problem Relation Age of Onset  . Prostate cancer Father     ALLERGIES:  is allergic to penicillins.  MEDICATIONS:  Current Outpatient Medications  Medication Sig Dispense Refill  . acetaminophen (TYLENOL) 325 MG tablet Take 650 mg by mouth 3 (three) times daily.     . diclofenac sodium (VOLTAREN) 1 % GEL Apply 2 g topically See admin instructions. Apply 2 g topically 4 times daily. Apply 2 g to left shoulder, apply 2 g to left hip and 2 g to left knee 4 times daily per [facility] instructions    . escitalopram (LEXAPRO) 20 MG tablet Take 1 tablet (20 mg total) by mouth daily. 90 tablet 3  . gabapentin (NEURONTIN) 100 MG capsule Take one to 2  tabs for pain 3 x day (Patient taking differently: Take 100 mg by mouth 3 (three) times daily. ) 180 capsule 3  . mirtazapine (REMERON) 7.5 MG tablet Take 7.5 mg by mouth at bedtime.    Marland Kitchen oxyCODONE (OXY IR/ROXICODONE) 5 MG immediate release tablet Take 5 mg by mouth every 6 (six) hours.     . pantoprazole (PROTONIX) 40 MG tablet Take 40 mg by mouth daily.     No current facility-administered medications for this visit.       Marland Kitchen  PHYSICAL EXAMINATION: ECOG PERFORMANCE STATUS: 1 - Symptomatic but completely ambulatory  Vitals:   10/25/18 0952  BP: 97/68  Pulse: 80  Resp: 16  Temp: (!) 97.5 F (36.4 C)   There were no vitals filed for this visit.  Physical Exam  Constitutional: He is oriented to person, place, and time.  In wheelchair. Cachectic.  Appears older than his stated age.  Alone.  HENT:  Head: Normocephalic and atraumatic.  Mouth/Throat: Oropharynx is clear and moist. No oropharyngeal exudate.  Eyes: Pupils are equal, round, and reactive to light.  Neck: Normal range of motion. Neck supple.  Cardiovascular: Normal rate and regular rhythm.  Pulmonary/Chest: No respiratory distress. He has no wheezes.  Abdominal: Soft. Bowel sounds are normal. He exhibits no distension and no  mass. There is no abdominal tenderness. There is no rebound and no guarding.  Musculoskeletal: Normal range of motion.        General: No tenderness or edema.  Neurological: He is alert and oriented to person, place, and time.  Weakness left upper and lower extremity.  Skin: Skin is warm.  Bilateral lower extremity ulcerations noted.  Pulses intact bilaterally.  Psychiatric: Affect normal.     LABORATORY DATA:  I have reviewed the data as listed Lab Results  Component Value Date   WBC 6.1 10/25/2018   HGB 12.4 (L) 10/25/2018   HCT 39.4 10/25/2018   MCV 81.7 10/25/2018   PLT 189 10/25/2018   Recent Labs    09/13/18 0845 10/04/18 0833 10/25/18 0919  NA 135 135 137  K 4.3 3.8 4.0  CL 106 107 106  CO2 20* 22 23  GLUCOSE 110* 88 105*  BUN _0 CREATININE 1.09 0.94 0.95  CALCIUM 9.0 8.4* 8.6*  GFRNONAA >60 >60 >60  GFRAA >60 >60 >60  PROT 8.0 7.5 7.6  ALBUMIN 3.4* 3.4* 3.4*  AST _1 ALT _2 ALKPHOS 99 86 93  BILITOT 0.4 0.5 0.5    RADIOGRAPHIC STUDIES: I have personally reviewed the radiological images as listed and agreed with the findings in the report. US Arterial Lower Extremity Duplex Bilateral  Result Date: 10/05/2018 CLINICAL DATA:  58 year old male with a history peripheral vascular disease EXAM: NONINVASIVE PHYSIOLOGIC VASCULAR STUDY OF BILATERAL LOWER EXTREMITIES TECHNIQUE: Evaluation of both lower extremities was performed at rest, including directed duplex. COMPARISON:  None. FINDINGS: Right ABI:  Not performed Left ABI:  Not performed Right Lower Extremity: Directed duplex of the right lower extremity demonstrates triphasic common femoral artery, profunda femoris, SFA, popliteal artery, posterior tibial artery, anterior tibial artery. Left Lower Extremity: Directed duplex of the left lower extremity demonstrates triphasic common femoral artery, profunda femoris, SFA, popliteal artery, posterior tibial artery, anterior tibial artery IMPRESSION:  Directed duplex of the bilateral lower extremities demonstrates waveforms within normal limits throughout. Signed, Dulcy Fanny. Dellia Nims, [facility] Radiology Electronically Signed   By: York Cerise  Earleen Newport D.O.   On: 10/05/2018 14:58    ASSESSMENT & PLAN:   Ureteral cancer, right ([place]) # High-grade urothelial cancer/cytology; likely of the right renal pelvis /upper ureter.  08/19/2018- CT C/A/P- "mild progression" of adenopathy within the chest and pelvis.  Clinically stable.   # Proceed with cycle #7 of keytruda today. Labs today reviewed;  acceptable for treatment today. Will get scan in 4-5 weeks; will order at next visit.   #Colon cancer.  Stage IIII- stable.  #Iron deficiency anemia-hemoglobin 13. Stable.   #Left knee pain/arthritic pain-STABLE; . Awaiting ortho referral.   #Cirrhosis/ spleenomegaly-Hepatitis C-positive/HCV RNA positive.  Stable.  # Bilateral LE ulcers-s/p wound care evaluation.   #DISPOSITION:  [name] today. # follow up 3 weeks for labs/MD/Keytruda-Dr.B    All questions were answered. The patient knows to call the clinic with any problems, questions or concerns.    Cammie Sickle, MD 10/25/2018 10:33 AM

## 2018-10-29 ENCOUNTER — Ambulatory Visit: Payer: Medicaid Other | Admitting: Physician Assistant

## 2018-11-14 ENCOUNTER — Other Ambulatory Visit: Payer: Self-pay

## 2018-11-15 ENCOUNTER — Ambulatory Visit: Payer: Medicaid Other

## 2018-11-15 ENCOUNTER — Inpatient Hospital Stay: Payer: Medicaid Other | Admitting: Oncology

## 2018-11-15 ENCOUNTER — Inpatient Hospital Stay: Payer: Medicaid Other

## 2018-11-15 NOTE — Progress Notes (Deleted)
Hematology/Oncology Consult note [facility]  [phone] [phone]  Patient Care Team: [name], MD as PCP - General (Internal Medicine)   Name of the patient: Chad [name]  [id number]  [birth year]   Date of visit: 11/15/18  Diagnosis-metastatic high-grade urothelial carcinoma  Chief complaint/ Reason for visit-on treatment assessment prior to cycle 8 of Keytruda  Heme/Onc history:  Oncology History   # SEP-OCT 2019-right renal pelvis/ ureteral [cytology positive HIGH grade urothelial carcinoma [Dr.Brandon]    # NOV 24th 2019-Keytruda  #Right ureteral obstruction status post stent placement  # JAN 2019- Right Colon ca [ T4N1]  [[facility]]; NO adjuvant therapy  # Hep C/ # stroke of left side/weakness-2018 Nov [[place]]; active smoker  DIAGNOSIS: # 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; # Colon ca- stage III  GOALS: palliative  CURRENT/MOST RECENT THERAPY: Keytruda      Urothelial cancer ([place])    Initial Diagnosis    Urothelial cancer ([place])     Ureteral cancer, right ([place])   05/26/2018 Initial Diagnosis    Ureteral cancer, right ([place])    06/21/2018 -  Chemotherapy    The patient had pembrolizumab (KEYTRUDA) 200 mg in sodium chloride 0.9 % 50 mL chemo infusion, 200 mg, Intravenous, Once, 7 of 8 cycles Administration: 200 mg (06/21/2018), 200 mg (07/12/2018), 200 mg (08/02/2018), 200 mg (08/23/2018), 200 mg (09/13/2018), 200 mg (10/04/2018), 200 mg (10/25/2018)  for chemotherapy treatment.       Interval history- ***  ECOG PS- *** Pain scale- *** Opioid associated constipation- ***  Review of systems- ROS   Current treatment- ***  Allergies  Allergen Reactions  . Penicillins Rash     Past Medical History:  Diagnosis Date  . Anemia   . Anxiety   . ARF (acute respiratory failure) ([place])   . Bladder cancer ([place])   . Cancer [facility])    prostate  . COPD (chronic obstructive pulmonary disease) ([place])   . Depression   .  Dysphagia   . GERD (gastroesophageal reflux disease)   . Hepatitis    chronic hep c  . Hyperlipidemia   . Malignant neoplasm of colon ([place])   . Nerve pain   . Stroke [facility])      Past Surgical History:  Procedure Laterality Date  . COLON SURGERY     En bloc extended right hemicolectomy 07/2017  . CYSTOSCOPY W/ RETROGRADES Right 08/30/2018   Procedure: CYSTOSCOPY WITH RETROGRADE PYELOGRAM;  Surgeon: Hollice Espy, MD;  Location: [facility] ORS;  Service: Urology;  Laterality: Right;  . CYSTOSCOPY WITH STENT PLACEMENT Right 04/25/2018   Procedure: CYSTOSCOPY WITH STENT PLACEMENT;  Surgeon: Hollice Espy, MD;  Location: [facility] ORS;  Service: Urology;  Laterality: Right;  . CYSTOSCOPY WITH STENT PLACEMENT Right 08/30/2018   Procedure: [place] WITH STENT Exchange;  Surgeon: Hollice Espy, MD;  Location: [facility] ORS;  Service: Urology;  Laterality: Right;  . tumor removed       Social History   Socioeconomic History  . Marital status: Single    Spouse name: Not on file  . Number of children: Not on file  . Years of education: Not on file  . Highest education level: Not on file  Occupational History  . Not on file  Social Needs  . Financial resource strain: Not on file  . Food insecurity:    Worry: Not on file    Inability: Not on file  . Transportation needs:    Medical: Not on  file    Non-medical: Not on file  Tobacco Use  . Smoking status: Current Every Day Smoker    Packs/day: 1.00    Types: Cigarettes  . Smokeless tobacco: Never Used  Substance and Sexual Activity  . Alcohol use: Not Currently    Frequency: Never  . Drug use: Not Currently  . Sexual activity: Not on file  Lifestyle  . Physical activity:    Days per week: Not on file    Minutes per session: Not on file  . Stress: Not on file  Relationships  . Social connections:    Talks on phone: Not on file    Gets together: Not on file    Attends religious service: Not on file    Active member of club or  organization: Not on file    Attends meetings of clubs or organizations: Not on file    Relationship status: Not on file  . Intimate partner violence:    Fear of current or ex partner: Not on file    Emotionally abused: Not on file    Physically abused: Not on file    Forced sexual activity: Not on file  Other Topics Concern  . Not on file  Social History Narrative    used to live [place]; moved  To [place]- end of April 2019; in Nursing home; 1pp/day; quit alcohol. 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        Family- dad- prostate ca [at 71y]; brother- 55 died of prostate cancer; brother- 110- no cancers [[place]]; sonGerald Stabs [[place]];[name]-32y prostate ca [facility] [place]]; daughter- 44 [[place]]; another daughter 61 [[place]/addict]. will refer genetics counseling. Given MSI- abnormal; highly suspicious of Lynch syndrome.  Patient's son [name] aware of high possible lynch syndrome.    Family History  Problem Relation Age of Onset  . Prostate cancer Father      Current Outpatient Medications:  .  acetaminophen (TYLENOL) 325 MG tablet, Take 650 mg by mouth 3 (three) times daily. , Disp: , Rfl:  .  diclofenac sodium (VOLTAREN) 1 % GEL, Apply 2 g topically See admin instructions. Apply 2 g topically 4 times daily. Apply 2 g to left shoulder, apply 2 g to left hip and 2 g to left knee 4 times daily per [facility] instructions, Disp: , Rfl:  .  escitalopram (LEXAPRO) 20 MG tablet, Take 1 tablet (20 mg total) by mouth daily., Disp: 90 tablet, Rfl: 3 .  gabapentin (NEURONTIN) 100 MG capsule, Take one to 2 tabs for pain 3 x day (Patient taking differently: Take 100 mg by mouth 3 (three) times daily. ), Disp: 180 capsule, Rfl: 3 .  mirtazapine (REMERON) 7.5 MG tablet, Take 7.5 mg by mouth at bedtime., Disp: , Rfl:  .  oxyCODONE (OXY IR/ROXICODONE) 5 MG immediate release tablet, Take 5 mg by mouth every 6 (six) hours. , Disp: , Rfl:  .  pantoprazole (PROTONIX) 40 MG tablet, Take 40 mg by mouth daily., Disp: , Rfl:   Physical  exam: There were no vitals filed for this visit. Physical Exam   CMP Latest Ref Rng & Units 10/25/2018  Glucose 70 - 99 mg/dL 105(H)  BUN 6 - 20 mg/dL 17  Creatinine 0.61 - 1.24 mg/dL 0.95  Sodium 135 - 145 mmol/L 137  Potassium 3.5 - 5.1 mmol/L 4.0  Chloride 98 - 111 mmol/L 106  CO2 22 - 32 mmol/L 23  Calcium 8.9 - 10.3 mg/dL 8.6(L)  Total Protein 6.5 - 8.1 g/dL  7.6  Total Bilirubin 0.3 - 1.2 mg/dL 0.5  Alkaline Phos 38 - 126 U/L 93  AST 15 - 41 U/L 21  ALT 0 - 44 U/L 10   CBC Latest Ref Rng & Units 10/25/2018  WBC 4.0 - 10.5 K/uL 6.1  Hemoglobin 13.0 - 17.0 g/dL 12.4(L)  Hematocrit 39.0 - 52.0 % 39.4  Platelets 150 - 400 K/uL 189    No images are attached to the encounter.  No results found.   Assessment and plan- Patient is a 58 y.o. male with metastatic high-grade urothelial carcinoma here for on treatment assessment prior to cycle 8 of Keytruda     Visit Diagnosis 1. Encounter for antineoplastic immunotherapy   2. Ureteral cancer, right [facility])      Dr. Randa Evens, MD, MPH [facility] [phone] 11/15/2018 8:29 AM

## 2018-11-18 ENCOUNTER — Other Ambulatory Visit: Payer: Self-pay

## 2018-11-18 ENCOUNTER — Non-Acute Institutional Stay: Payer: Medicaid Other | Admitting: Nurse Practitioner

## 2018-11-18 VITALS — BP 120/76 | HR 83 | Temp 97.3°F | Resp 18 | Ht 67.0 in | Wt 153.0 lb

## 2018-11-18 DIAGNOSIS — Z515 Encounter for palliative care: Secondary | ICD-10-CM

## 2018-11-18 DIAGNOSIS — G894 Chronic pain syndrome: Secondary | ICD-10-CM

## 2018-11-18 DIAGNOSIS — R531 Weakness: Secondary | ICD-10-CM

## 2018-11-18 DIAGNOSIS — R630 Anorexia: Secondary | ICD-10-CM

## 2018-11-18 NOTE — Progress Notes (Signed)
[facility] Consult Note Telephone: [phone]  Fax: [phone]  PATIENT NAME: Chad [name] DOB: Jul 17, 1961 MRN: [id number]  PRIMARY CARE PROVIDER:   [name], MD  REFERRING PROVIDER:  Dr [facility] RESPONSIBLE PARTY:   Self RECOMMENDATIONS and PLAN:  1. Palliative care encounter Z51.5; Palliative medicine team will continue to support patient, patient's family, and medical team. Visit consisted of counseling and education dealing with the complex and emotionally intense issues of symptom management and palliative care in the setting of serious and potentially life-threatening illness  2.Anorexia R63.0 secondary to protein calorie malnutritionrelated to cancerimprovingongoing.Continue to monitor daily weights, supplements, supportive measures and courage to eat  3.Generalized weaknessR53.1 secondary to multiple chronic disease progression including anemia, late onset CVA, urothelial and colon cancer.Encourage energy conservation and rest times.  4. Chronic pain G89.29 monitor on pain scale, monitor efficacy vs adverse side effects. Currently taking tylenol for mild to moderate; oxycodone 5mg  q6hrs for moderate/severe pain; continue voltaren gel  ASSESSMENT:     I visit and observed Mr. Bentz. We talked about purpose or palliative care visit and he was in agreement. We talked about how he's feeling today. He sure that he is tired. He was getting ready to take a nap. He denies any pain. He shared that he takes oxycodone which does relieve his pain. 0/24hrs. He shared the Voltaren gel helps most. He denies shortness of breath. He does continue to go to the smoking patio to smoke and decline smoking cessation. We talked about recent infusion. He talked about continuing treatments and at this point in time they do make him tired. We talked about challenges with covid-19 and social isolation no visitors at  the facility. He talked about his son. We talked about coping strategies. He was cooperative with assessment. He had no concerns or complaints at present time. Discuss that will follow up in one month for palliative care and Mr. Beeney was an agreement. Therapeutic listening and emotional support provided. Contact information and questions answered satisfaction. I updated staff no new changes to current goals are plan of care as pain regiment is currently effective. I did attempt to contact his son Nicki Reaper, unable to leave a message.  3 / 23 / 2020 weight 144.5 lbs 4 / 3 / 2020 weight 153.0 lbs  I spent 45 minutes providing this consultation,  from 11:00am to 11:45am. More than 50% of the time in this consultation was spent coordinating communication.   HISTORY OF PRESENT ILLNESS:  Chad [name] is a 58 y.o. year old male with multiple medical problems including late onset CVA, urothelial cancer, prostate cancer, chronic hepatitis C, hyperlipidemia, colon surgery, protein calorie malnutrition, anxiety, depression.Continues to reside at skilled [place]. He does transfer himself to the wheelchair and is mobile. He often goes outside to smoke. He does require some assistance with ADLs. He does feed himself an appetite remains poor. No recent Falls. He does remain a DNR. He continues to receive Voltaren gel for chronic pain. He also receives Tylenol  650mg  tid, oxycodone  5 mg q6 hours as needed for pain in addition to Gabapentin 800 mg tid. He does take Remeron for appetite stimulant. He continues to take escitalopram for depression. He currently is on protonix for GERD. He is on the way other medications. He continues to be followed buy Psychotherapy in addition to Psychiatry. Last psychiatric visit at the facility 3 / 26 / 2020 with no new  medication adjustment and supportive care. Last oncology visit with Dr [name]  3 / 30 / 2020 plan is high-grade urethral cancer likely of the  right renal pelvis / upper ureter. 1 / 23 / 2020 CT  C/A/P mild progression of an apathy within the chest in pelvis clinically stable. Proceed with cycle 7 of keytruda 3 / 30 / 2020; colon cancer stage 4 stable. Iron deficiency anemia with hemoglobin stable at 13. Arthritic pain left knee waiting Orthopedic referral. Cirrhosis / splenomegaly / hepatitis C positive /HCV RCA positive stable. Also is followed by wound care at the facility with last date of service 4/ 6 / 2020 for right leg venous necrotic tissue excessive measuring area 36 cm x length 18 cm x with 2 cm x 0.2 CM, left leg venous with serous drainage measuring area 57 cm x length 9.5 cm x with 6 cm x depth 0.2 cm. Treatment consisted of call alginate with wrap lower extremity with dry dressing for edema control. Appetite continues to be poor.  BMI 24 and he has had weight gain over the last 4 weeks. He does verbalize his needs. Staff endorses no further changes. At present he is lying in bed. He appears comfortable. No visitors present. Palliative Care was asked to help to continue to address goals of care.   CODE STATUS: DNR  PPS: 40% HOSPICE ELIGIBILITY/DIAGNOSIS: TBD  PAST MEDICAL HISTORY:  Past Medical History:  Diagnosis Date   Anemia    Anxiety    ARF (acute respiratory failure) ([facility])    Bladder cancer ([facility])    Cancer ([facility])    prostate   COPD (chronic obstructive pulmonary disease) ([facility])    Depression    Dysphagia    GERD (gastroesophageal reflux disease)    Hepatitis    chronic hep c   Hyperlipidemia    Malignant neoplasm of colon ([facility])    Nerve pain    Stroke ([facility])     SOCIAL HX:  Social History   Tobacco Use   Smoking status: Current Every Day Smoker    Packs/day: 1.00    Types: Cigarettes   Smokeless tobacco: Never Used  Substance Use Topics   Alcohol use: Not Currently    Frequency: Never    ALLERGIES:  Allergies  Allergen Reactions   Penicillins Rash     PERTINENT MEDICATIONS:    Outpatient Encounter Medications as of 11/18/2018  Medication Sig   acetaminophen (TYLENOL) 325 MG tablet Take 650 mg by mouth 3 (three) times daily.    diclofenac sodium (VOLTAREN) 1 % GEL Apply 2 g topically See admin instructions. Apply 2 g topically 4 times daily. Apply 2 g to left shoulder, apply 2 g to left hip and 2 g to left knee 4 times daily per [facility] instructions   escitalopram (LEXAPRO) 20 MG tablet Take 1 tablet (20 mg total) by mouth daily.   gabapentin (NEURONTIN) 100 MG capsule Take one to 2 tabs for pain 3 x day (Patient taking differently: Take 100 mg by mouth 3 (three) times daily. )   mirtazapine (REMERON) 7.5 MG tablet Take 7.5 mg by mouth at bedtime.   oxyCODONE (OXY IR/ROXICODONE) 5 MG immediate release tablet Take 5 mg by mouth every 6 (six) hours.    pantoprazole (PROTONIX) 40 MG tablet Take 40 mg by mouth daily.   No facility-administered encounter medications on file as of 11/18/2018.     PHYSICAL EXAM:   General: NAD, frail appearing, thin pleasant male Cardiovascular: regular rate  and rhythm Pulmonary: clear ant fields Abdomen: soft, nontender, + bowel sounds GU: no suprapubic tenderness Extremities: no edema, no joint deformities Skin: no rashes/right leg venous necrotic tissue excessive measuring area 36 cm x length 18 cm x with 2 cm x 0.2 CM, left leg venous with serous drainage measuring area 57 cm x length 9.5 cm x with 6 cm x depth 0.2 cm Neurological: Weakness but otherwise nonfocal  Bryar Dahms Ihor Gully, NP

## 2018-11-21 ENCOUNTER — Other Ambulatory Visit: Payer: Self-pay

## 2018-11-22 ENCOUNTER — Inpatient Hospital Stay: Payer: Medicaid Other

## 2018-11-22 ENCOUNTER — Inpatient Hospital Stay: Payer: Medicaid Other | Attending: Internal Medicine

## 2018-11-22 ENCOUNTER — Other Ambulatory Visit: Payer: Self-pay

## 2018-11-22 ENCOUNTER — Inpatient Hospital Stay (HOSPITAL_BASED_OUTPATIENT_CLINIC_OR_DEPARTMENT_OTHER): Payer: Medicaid Other | Admitting: Internal Medicine

## 2018-11-22 ENCOUNTER — Inpatient Hospital Stay: Payer: Medicaid Other | Admitting: Oncology

## 2018-11-22 VITALS — BP 106/66 | HR 75 | Temp 98.2°F | Resp 20 | Ht 67.0 in | Wt 151.3 lb

## 2018-11-22 DIAGNOSIS — F17210 Nicotine dependence, cigarettes, uncomplicated: Secondary | ICD-10-CM

## 2018-11-22 DIAGNOSIS — E785 Hyperlipidemia, unspecified: Secondary | ICD-10-CM

## 2018-11-22 DIAGNOSIS — M549 Dorsalgia, unspecified: Secondary | ICD-10-CM | POA: Diagnosis not present

## 2018-11-22 DIAGNOSIS — I69354 Hemiplegia and hemiparesis following cerebral infarction affecting left non-dominant side: Secondary | ICD-10-CM | POA: Insufficient documentation

## 2018-11-22 DIAGNOSIS — R5383 Other fatigue: Secondary | ICD-10-CM

## 2018-11-22 DIAGNOSIS — Z85038 Personal history of other malignant neoplasm of large intestine: Secondary | ICD-10-CM | POA: Diagnosis not present

## 2018-11-22 DIAGNOSIS — K7460 Unspecified cirrhosis of liver: Secondary | ICD-10-CM | POA: Insufficient documentation

## 2018-11-22 DIAGNOSIS — Z9221 Personal history of antineoplastic chemotherapy: Secondary | ICD-10-CM | POA: Diagnosis not present

## 2018-11-22 DIAGNOSIS — B182 Chronic viral hepatitis C: Secondary | ICD-10-CM | POA: Insufficient documentation

## 2018-11-22 DIAGNOSIS — R5381 Other malaise: Secondary | ICD-10-CM | POA: Diagnosis not present

## 2018-11-22 DIAGNOSIS — K219 Gastro-esophageal reflux disease without esophagitis: Secondary | ICD-10-CM | POA: Insufficient documentation

## 2018-11-22 DIAGNOSIS — C661 Malignant neoplasm of right ureter: Secondary | ICD-10-CM

## 2018-11-22 DIAGNOSIS — Z791 Long term (current) use of non-steroidal anti-inflammatories (NSAID): Secondary | ICD-10-CM | POA: Insufficient documentation

## 2018-11-22 DIAGNOSIS — L98499 Non-pressure chronic ulcer of skin of other sites with unspecified severity: Secondary | ICD-10-CM

## 2018-11-22 DIAGNOSIS — Z5112 Encounter for antineoplastic immunotherapy: Secondary | ICD-10-CM | POA: Diagnosis not present

## 2018-11-22 DIAGNOSIS — R161 Splenomegaly, not elsewhere classified: Secondary | ICD-10-CM | POA: Insufficient documentation

## 2018-11-22 DIAGNOSIS — R61 Generalized hyperhidrosis: Secondary | ICD-10-CM

## 2018-11-22 DIAGNOSIS — Z79899 Other long term (current) drug therapy: Secondary | ICD-10-CM

## 2018-11-22 DIAGNOSIS — R531 Weakness: Secondary | ICD-10-CM | POA: Insufficient documentation

## 2018-11-22 DIAGNOSIS — J449 Chronic obstructive pulmonary disease, unspecified: Secondary | ICD-10-CM | POA: Diagnosis not present

## 2018-11-22 DIAGNOSIS — Z515 Encounter for palliative care: Secondary | ICD-10-CM

## 2018-11-22 DIAGNOSIS — M25569 Pain in unspecified knee: Secondary | ICD-10-CM | POA: Diagnosis not present

## 2018-11-22 LAB — COMPREHENSIVE METABOLIC PANEL
ALT: 8 U/L (ref 0–44)
AST: 21 U/L (ref 15–41)
Albumin: 3.6 g/dL (ref 3.5–5.0)
Alkaline Phosphatase: 88 U/L (ref 38–126)
Anion gap: 8 (ref 5–15)
BUN: 18 mg/dL (ref 6–20)
CO2: 25 mmol/L (ref 22–32)
Calcium: 8.8 mg/dL — ABNORMAL LOW (ref 8.9–10.3)
Chloride: 105 mmol/L (ref 98–111)
Creatinine, Ser: 0.98 mg/dL (ref 0.61–1.24)
GFR calc Af Amer: >60 mL/min (ref >60)
GFR calc non Af Amer: >60 mL/min (ref >60)
Glucose, Bld: 94 mg/dL (ref 70–99)
Potassium: 4.1 mmol/L (ref 3.5–5.1)
Sodium: 138 mmol/L (ref 135–145)
Total Bilirubin: 0.5 mg/dL (ref 0.3–1.2)
Total Protein: 8 g/dL (ref 6.5–8.1)

## 2018-11-22 LAB — CBC WITH DIFFERENTIAL/PLATELET
Abs Immature Granulocytes: 0.02 10*3/uL (ref 0.00–0.07)
Basophils Absolute: 0.1 10*3/uL (ref 0.0–0.1)
Basophils Relative: 1 %
Eosinophils Absolute: 0.4 10*3/uL (ref 0.0–0.5)
Eosinophils Relative: 7 %
HCT: 40.6 % (ref 39.0–52.0)
Hemoglobin: 12.7 g/dL — ABNORMAL LOW (ref 13.0–17.0)
Immature Granulocytes: 0 %
Lymphocytes Relative: 18 %
Lymphs Abs: 1.1 10*3/uL (ref 0.7–4.0)
MCH: 25.8 pg — ABNORMAL LOW (ref 26.0–34.0)
MCHC: 31.3 g/dL (ref 30.0–36.0)
MCV: 82.4 fL (ref 80.0–100.0)
Monocytes Absolute: 0.4 10*3/uL (ref 0.1–1.0)
Monocytes Relative: 8 %
Neutro Abs: 3.8 10*3/uL (ref 1.7–7.7)
Neutrophils Relative %: 66 %
Platelets: 170 10*3/uL (ref 150–400)
RBC: 4.93 MIL/uL (ref 4.22–5.81)
RDW: 15.8 % — ABNORMAL HIGH (ref 11.5–15.5)
WBC: 5.7 10*3/uL (ref 4.0–10.5)
nRBC: 0 % (ref 0.0–0.2)

## 2018-11-22 MED ORDER — SODIUM CHLORIDE 0.9 % IV SOLN
Freq: Once | INTRAVENOUS | Status: AC
  Administered 2018-11-22: 11:00:00 via INTRAVENOUS
  Filled 2018-11-22: qty 250

## 2018-11-22 MED ORDER — SODIUM CHLORIDE 0.9 % IV SOLN
200.0000 mg | Freq: Once | INTRAVENOUS | Status: AC
  Administered 2018-11-22: 200 mg via INTRAVENOUS
  Filled 2018-11-22: qty 8

## 2018-11-22 NOTE — Assessment & Plan Note (Addendum)
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  08/19/2018- CT C/A/P- "mild progression" of adenopathy within the chest and pelvis. Clinically stable.   # Proceed with cycle #8 of keytruda today. Labs today reviewed;  acceptable for treatment today.  CT scan ordered after this cycle.  If patient has continued progression-the next option would be chemotherapy gemcitabine/carboplatin; vs. hospice  #Colon cancer.  Stage IIII- stable.   #Iron deficiency anemia-hemoglobin 12. Stable.    # Left knee pain/arthritic pain-stable.  Awaiting ortho referral.   #Cirrhosis/ spleenomegaly-Hepatitis C-positive/HCV RNA positive.  Stable.  # Bilateral LE ulcers-s/p wound care evaluation.   #DISPOSITION:  [name] today. # follow up 3 weeks for labs/X-MD/Keytruda; CT scans prior- Dr.B

## 2018-11-22 NOTE — Progress Notes (Signed)
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; # Colon ca- stage III  GOALS: palliative  CURRENT/MOST RECENT THERAPY: Keytruda      Urothelial cancer ([place])    Initial Diagnosis    Urothelial cancer ([place])     Ureteral cancer, right ([place])   05/26/2018 Initial Diagnosis    Ureteral cancer, right ([place])    06/21/2018 -  Chemotherapy    The patient had pembrolizumab (KEYTRUDA) 200 mg in sodium chloride 0.9 % 50 mL chemo infusion, 200 mg, Intravenous, Once, 7 of 9 cycles Administration: 200 mg (06/21/2018), 200 mg (07/12/2018), 200 mg (08/02/2018), 200 mg (08/23/2018), 200 mg (09/13/2018), 200 mg (10/04/2018), 200 mg (10/25/2018)  for chemotherapy treatment.       HISTORY OF PRESENTING ILLNESS: Patient is a poor historian.  Is alone. Chad [name] 58 y.o.  male above multiple medical problems-urothelial cancer/question advanced currently on Keytruda is here for follow-up.  Treatment held last week sec to transportation issues. Still having knee pain.   No fever no chills.  Review of Systems  Constitutional: Positive for malaise/fatigue. Negative for chills, diaphoresis, fever and weight loss.  HENT: Negative for nosebleeds and sore throat.   Eyes: Negative for double vision.  Respiratory: Positive for cough and shortness of breath. Negative for hemoptysis and wheezing.   Cardiovascular: Positive for leg swelling. Negative for chest pain, palpitations and  orthopnea.  Gastrointestinal: Negative for abdominal pain, blood in stool, constipation, diarrhea, heartburn, melena, nausea and vomiting.  Genitourinary: Negative for dysuria, frequency and urgency.  Musculoskeletal: Positive for back pain and joint pain.  Skin: Negative.  Negative for itching and rash.  Neurological: Positive for focal weakness. Negative for dizziness, tingling, weakness and headaches.       Chronic left-sided weakness upper than lower extremity.  Endo/Heme/Allergies: Does not bruise/bleed easily.  Psychiatric/Behavioral: Negative for depression. The patient is not nervous/anxious and does not have insomnia.      MEDICAL HISTORY:  Past Medical History:  Diagnosis Date  . Anemia   . Anxiety   . ARF (acute respiratory failure) ([place])   . Bladder cancer ([address])   . Cancer [facility])    prostate  . COPD (chronic obstructive pulmonary disease) ([place])   . Depression   . Dysphagia   . GERD (gastroesophageal reflux disease)   . Hepatitis    chronic hep c  . Hyperlipidemia   . Malignant neoplasm of colon ([place])   . Nerve pain   . Stroke [facility])     SURGICAL HISTORY: Past Surgical History:  Procedure Laterality Date  . COLON SURGERY     En bloc extended right hemicolectomy 07/2017  . CYSTOSCOPY W/ RETROGRADES Right 08/30/2018   Procedure: CYSTOSCOPY WITH RETROGRADE PYELOGRAM;  Surgeon: Hollice Espy, MD;  Location: [facility] ORS;  Service: Urology;  Laterality: Right;  . CYSTOSCOPY WITH STENT PLACEMENT Right 04/25/2018   Procedure: CYSTOSCOPY WITH STENT PLACEMENT;  Surgeon: Hollice Espy, MD;  Location: [facility] ORS;  Service: Urology;  Laterality: Right;  . CYSTOSCOPY WITH STENT PLACEMENT Right 08/30/2018   Procedure: [place] WITH STENT Exchange;  Surgeon: Hollice Espy, MD;  Location: [facility] ORS;  Service: Urology;  Laterality: Right;  . tumor removed       SOCIAL HISTORY: Social History   Socioeconomic History  . Marital status: Single    Spouse name: Not on file  . Number  of children: Not on file  . Years of education: Not on file  . Highest education level: Not on file  Occupational History  . Not on file  Social Needs  . Financial resource strain: Not on file  . Food insecurity:    Worry: Not on file    Inability: Not on file  . Transportation needs:    Medical: Not on file    Non-medical: Not on file  Tobacco Use  . Smoking status: Current Every Day Smoker    Packs/day: 1.00    Types: Cigarettes  . Smokeless tobacco: Never Used  Substance and Sexual Activity  . Alcohol use: Not Currently    Frequency: Never  . Drug use: Not Currently  . Sexual activity: Not on file  Lifestyle  . Physical activity:    Days per week: Not on file    Minutes per session: Not on file  . Stress: Not on file  Relationships  . Social connections:    Talks on phone: Not on file    Gets together: Not on file    Attends religious service: Not on file    Active member of club or organization: Not on file    Attends meetings of clubs or organizations: Not on file    Relationship status: Not on file  . Intimate partner violence:    Fear of current or ex partner: Not on file    Emotionally abused: Not on file    Physically abused: Not on file    Forced sexual activity: Not on file  Other Topics Concern  . Not on file  Social History Narrative    used to live [place]; moved  To [place]- end of April 2019; in Nursing home; 1pp/day; quit alcohol. 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        Family- dad- prostate ca [at 34y]; brother- 32 died of prostate cancer; brother- 33- no cancers [[place]]; sonGerald Stabs [[place]];[name]-32y prostate ca [facility] [place]]; daughter- 31 [[place]]; another daughter 52 [[place]/addict]. will refer genetics counseling. Given MSI- abnormal; highly suspicious of Lynch syndrome.  Patient's son [name] aware of high possible lynch syndrome.    FAMILY HISTORY: Family History  Problem Relation Age of Onset  . Prostate cancer Father     ALLERGIES:  is allergic to  penicillins.  MEDICATIONS:  Current Outpatient Medications  Medication Sig Dispense Refill  . acetaminophen (TYLENOL) 325 MG tablet Take 650 mg by mouth 3 (three) times daily.     . diclofenac sodium (VOLTAREN) 1 % GEL Apply 2 g topically See admin instructions. Apply 2 g topically 4 times daily. Apply 2 g to left shoulder, apply 2 g to left hip and 2 g to left knee 4 times daily per [facility] instructions    . escitalopram (LEXAPRO) 20 MG tablet Take 1 tablet (20 mg total) by mouth daily. 90 tablet 3  . gabapentin (NEURONTIN) 100 MG capsule Take one to 2 tabs for pain 3 x day (Patient taking differently: Take 100 mg by mouth 3 (three) times daily. ) 180 capsule 3  . mirtazapine (REMERON)  7.5 MG tablet Take 7.5 mg by mouth at bedtime.    Marland Kitchen oxyCODONE (OXY IR/ROXICODONE) 5 MG immediate release tablet Take 5 mg by mouth every 6 (six) hours.     . pantoprazole (PROTONIX) 40 MG tablet Take 40 mg by mouth daily.     No current facility-administered medications for this visit.       Marland Kitchen  PHYSICAL EXAMINATION: ECOG PERFORMANCE STATUS: 1 - Symptomatic but completely ambulatory  Vitals:   11/22/18 1015  BP: 106/66  Pulse: 75  Resp: 20  Temp: 98.2 F (36.8 C)   Filed Weights   11/22/18 1020  Weight: 151 lb 4.8 oz (68.6 kg)    Physical Exam  Constitutional: He is oriented to person, place, and time.  In wheelchair. Cachectic.  Appears older than his stated age.  Alone.  HENT:  Head: Normocephalic and atraumatic.  Mouth/Throat: Oropharynx is clear and moist. No oropharyngeal exudate.  Eyes: Pupils are equal, round, and reactive to light.  Neck: Normal range of motion. Neck supple.  Cardiovascular: Normal rate and regular rhythm.  Pulmonary/Chest: No respiratory distress. He has no wheezes.  Abdominal: Soft. Bowel sounds are normal. He exhibits no distension and no mass. There is no abdominal tenderness. There is no rebound and no guarding.  Musculoskeletal: Normal range of motion.         General: No tenderness or edema.  Neurological: He is alert and oriented to person, place, and time.  Weakness left upper and lower extremity.  Skin: Skin is warm.  Bilateral lower extremity ulcerations noted.  Pulses intact bilaterally.  Psychiatric: Affect normal.     LABORATORY DATA:  I have reviewed the data as listed Lab Results  Component Value Date   WBC 5.7 11/22/2018   HGB 12.7 (L) 11/22/2018   HCT 40.6 11/22/2018   MCV 82.4 11/22/2018   PLT 170 11/22/2018   Recent Labs    10/04/18 0833 10/25/18 0919 11/22/18 1006  NA 135 137 138  K 3.8 4.0 4.1  CL 107 106 105  CO2 _0 GLUCOSE 88 105* 94  BUN _1 CREATININE 0.94 0.95 0.98  CALCIUM 8.4* 8.6* 8.8*  GFRNONAA >60 >60 >60  GFRAA >60 >60 >60  PROT 7.5 7.6 8.0  ALBUMIN 3.4* 3.4* 3.6  AST _2 ALT _3 ALKPHOS 86 93 88  BILITOT 0.5 0.5 0.5    RADIOGRAPHIC STUDIES: I have personally reviewed the radiological images as listed and agreed with the findings in the report. No results found.  ASSESSMENT & PLAN:   Ureteral cancer, right ([place]) # High-grade urothelial cancer/cytology; likely of the right renal pelvis /upper ureter.  08/19/2018- CT C/A/P- "mild progression" of adenopathy within the chest and pelvis. Clinically stable.   # Proceed with cycle #8 of keytruda today. Labs today reviewed;  acceptable for treatment today.  CT scan ordered after this cycle.  If patient has continued progression-the next option would be chemotherapy gemcitabine/carboplatin; vs. hospice  #Colon cancer.  Stage IIII- stable.   #Iron deficiency anemia-hemoglobin 12. Stable.    # Left knee pain/arthritic pain-stable.  Awaiting ortho referral.   #Cirrhosis/ spleenomegaly-Hepatitis C-positive/HCV RNA positive.  Stable.  # Bilateral LE ulcers-s/p wound care evaluation.   #DISPOSITION:  [name] today. # follow up 3 weeks for labs/X-MD/Keytruda; CT scans prior- Dr.B    All questions were answered. The  patient knows to call the clinic with any problems, questions or concerns.  Cammie Sickle, MD 11/22/2018 10:58 AM

## 2018-11-25 ENCOUNTER — Encounter: Payer: Medicaid Other | Admitting: Licensed Clinical Social Worker

## 2018-12-08 ENCOUNTER — Other Ambulatory Visit: Payer: Self-pay | Admitting: *Deleted

## 2018-12-08 DIAGNOSIS — C661 Malignant neoplasm of right ureter: Secondary | ICD-10-CM

## 2018-12-10 ENCOUNTER — Ambulatory Visit
Admission: RE | Admit: 2018-12-10 | Discharge: 2018-12-10 | Disposition: A | Discharge status: Home / Self Care | Payer: Medicaid Other | Source: Ambulatory Visit | Attending: Internal Medicine | Admitting: Internal Medicine

## 2018-12-10 ENCOUNTER — Other Ambulatory Visit: Payer: Self-pay

## 2018-12-10 DIAGNOSIS — C661 Malignant neoplasm of right ureter: Secondary | ICD-10-CM | POA: Insufficient documentation

## 2018-12-10 MED ORDER — IOHEXOL 300 MG/ML  SOLN
100.0000 mL | Freq: Once | INTRAMUSCULAR | Status: AC | PRN
  Administered 2018-12-10: 100 mL via INTRAVENOUS

## 2018-12-13 ENCOUNTER — Inpatient Hospital Stay: Payer: Medicaid Other | Attending: Internal Medicine | Admitting: Oncology

## 2018-12-13 ENCOUNTER — Other Ambulatory Visit: Payer: Self-pay

## 2018-12-13 ENCOUNTER — Inpatient Hospital Stay: Payer: Medicaid Other

## 2018-12-13 ENCOUNTER — Encounter: Payer: Self-pay | Admitting: Oncology

## 2018-12-13 VITALS — BP 111/73 | HR 82 | Temp 97.4°F | Resp 18 | Wt 151.7 lb

## 2018-12-13 VITALS — BP 111/72 | HR 85 | Temp 96.0°F | Resp 18

## 2018-12-13 DIAGNOSIS — D649 Anemia, unspecified: Secondary | ICD-10-CM | POA: Diagnosis not present

## 2018-12-13 DIAGNOSIS — Z8673 Personal history of transient ischemic attack (TIA), and cerebral infarction without residual deficits: Secondary | ICD-10-CM | POA: Insufficient documentation

## 2018-12-13 DIAGNOSIS — J449 Chronic obstructive pulmonary disease, unspecified: Secondary | ICD-10-CM | POA: Diagnosis not present

## 2018-12-13 DIAGNOSIS — Z79899 Other long term (current) drug therapy: Secondary | ICD-10-CM | POA: Insufficient documentation

## 2018-12-13 DIAGNOSIS — Z515 Encounter for palliative care: Secondary | ICD-10-CM

## 2018-12-13 DIAGNOSIS — Z5112 Encounter for antineoplastic immunotherapy: Secondary | ICD-10-CM

## 2018-12-13 DIAGNOSIS — Z85038 Personal history of other malignant neoplasm of large intestine: Secondary | ICD-10-CM | POA: Diagnosis not present

## 2018-12-13 DIAGNOSIS — C661 Malignant neoplasm of right ureter: Secondary | ICD-10-CM

## 2018-12-13 DIAGNOSIS — C679 Malignant neoplasm of bladder, unspecified: Secondary | ICD-10-CM | POA: Diagnosis not present

## 2018-12-13 DIAGNOSIS — F17210 Nicotine dependence, cigarettes, uncomplicated: Secondary | ICD-10-CM | POA: Diagnosis not present

## 2018-12-13 DIAGNOSIS — E785 Hyperlipidemia, unspecified: Secondary | ICD-10-CM | POA: Insufficient documentation

## 2018-12-13 DIAGNOSIS — C182 Malignant neoplasm of ascending colon: Secondary | ICD-10-CM

## 2018-12-13 DIAGNOSIS — M25562 Pain in left knee: Secondary | ICD-10-CM

## 2018-12-13 LAB — CBC WITH DIFFERENTIAL/PLATELET
Abs Immature Granulocytes: 0.01 10*3/uL (ref 0.00–0.07)
Basophils Absolute: 0.1 10*3/uL (ref 0.0–0.1)
Basophils Relative: 1 %
Eosinophils Absolute: 0.5 10*3/uL (ref 0.0–0.5)
Eosinophils Relative: 7 %
HCT: 38.4 % — ABNORMAL LOW (ref 39.0–52.0)
Hemoglobin: 12.3 g/dL — ABNORMAL LOW (ref 13.0–17.0)
Immature Granulocytes: 0 %
Lymphocytes Relative: 17 %
Lymphs Abs: 1.3 10*3/uL (ref 0.7–4.0)
MCH: 26.3 pg (ref 26.0–34.0)
MCHC: 32 g/dL (ref 30.0–36.0)
MCV: 82.2 fL (ref 80.0–100.0)
Monocytes Absolute: 0.5 10*3/uL (ref 0.1–1.0)
Monocytes Relative: 7 %
Neutro Abs: 4.9 10*3/uL (ref 1.7–7.7)
Neutrophils Relative %: 68 %
Platelets: 179 10*3/uL (ref 150–400)
RBC: 4.67 MIL/uL (ref 4.22–5.81)
RDW: 15.2 % (ref 11.5–15.5)
WBC: 7.3 10*3/uL (ref 4.0–10.5)
nRBC: 0 % (ref 0.0–0.2)

## 2018-12-13 LAB — COMPREHENSIVE METABOLIC PANEL
ALT: 8 U/L (ref 0–44)
AST: 17 U/L (ref 15–41)
Albumin: 3.8 g/dL (ref 3.5–5.0)
Alkaline Phosphatase: 87 U/L (ref 38–126)
Anion gap: 7 (ref 5–15)
BUN: 21 mg/dL — ABNORMAL HIGH (ref 6–20)
CO2: 23 mmol/L (ref 22–32)
Calcium: 8.8 mg/dL — ABNORMAL LOW (ref 8.9–10.3)
Chloride: 106 mmol/L (ref 98–111)
Creatinine, Ser: 0.94 mg/dL (ref 0.61–1.24)
GFR calc Af Amer: >60 mL/min (ref >60)
GFR calc non Af Amer: >60 mL/min (ref >60)
Glucose, Bld: 93 mg/dL (ref 70–99)
Potassium: 4.1 mmol/L (ref 3.5–5.1)
Sodium: 136 mmol/L (ref 135–145)
Total Bilirubin: 0.6 mg/dL (ref 0.3–1.2)
Total Protein: 8.1 g/dL (ref 6.5–8.1)

## 2018-12-13 LAB — TSH: TSH: 4.756 u[IU]/mL — ABNORMAL HIGH (ref 0.350–4.500)

## 2018-12-13 MED ORDER — SODIUM CHLORIDE 0.9 % IV SOLN
Freq: Once | INTRAVENOUS | Status: AC
  Administered 2018-12-13: 12:00:00 via INTRAVENOUS
  Filled 2018-12-13: qty 250

## 2018-12-13 MED ORDER — SODIUM CHLORIDE 0.9 % IV SOLN
200.0000 mg | Freq: Once | INTRAVENOUS | Status: AC
  Administered 2018-12-13: 200 mg via INTRAVENOUS
  Filled 2018-12-13: qty 8

## 2018-12-13 NOTE — Progress Notes (Signed)
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; # Colon ca- stage III  GOALS: palliative  CURRENT/MOST RECENT THERAPY: Keytruda      Urothelial cancer ([place])    Initial Diagnosis    Urothelial cancer ([place])     Ureteral cancer, right ([place])   05/26/2018 Initial Diagnosis    Ureteral cancer, right ([place])    06/21/2018 -  Chemotherapy    The patient had pembrolizumab (KEYTRUDA) 200 mg in sodium chloride 0.9 % 50 mL chemo infusion, 200 mg, Intravenous, Once, 9 of 9 cycles Administration: 200 mg (06/21/2018), 200 mg (07/12/2018), 200 mg (08/02/2018), 200 mg (08/23/2018), 200 mg (09/13/2018), 200 mg (10/04/2018), 200 mg (10/25/2018), 200 mg (11/22/2018), 200 mg (12/13/2018)  for chemotherapy treatment.       HISTORY OF PRESENTING ILLNESS: Patient is a poor historian.  Is alone. Chad [name] 58 y.o.  male above multiple medical problems-urothelial cancer/question advanced currently on Keytruda presents for follow up to discuss CT results and evaluation prior to immunotherapy. Patient reports doing well at baseline.  Continue to have chronic knee pain, left more than right, Also have chronic lower extremity edema. No new complaints.  Denies any fever, chills, nausea, vomiting, diarrhea, skin rash.  Review of Systems  Constitutional: Positive for malaise/fatigue. Negative for chills, diaphoresis, fever and weight loss.  HENT: Negative  for nosebleeds and sore throat.   Eyes: Negative for double vision and redness.  Respiratory: Positive for shortness of breath. Negative for cough, hemoptysis and wheezing.   Cardiovascular: Positive for leg swelling. Negative for chest pain, palpitations and orthopnea.  Gastrointestinal: Negative for abdominal pain, blood in stool, constipation, diarrhea, heartburn, melena, nausea and vomiting.  Genitourinary: Negative for dysuria, frequency and urgency.  Musculoskeletal: Positive for back pain and joint pain. Negative for myalgias.  Skin: Negative.  Negative for itching and rash.  Neurological: Positive for focal weakness. Negative for dizziness, tingling, tremors, weakness and headaches.       Chronic left-sided weakness upper than lower extremity.  Endo/Heme/Allergies: Does not bruise/bleed easily.  Psychiatric/Behavioral: Negative for depression and hallucinations. The patient is not nervous/anxious and does not have insomnia.      MEDICAL HISTORY:  Past Medical History:  Diagnosis Date  . Anemia   . Anxiety   . ARF (acute respiratory failure) ([place])   . Bladder cancer ([place])   . Cancer [facility])    prostate  . COPD (chronic obstructive pulmonary disease) ([place])   . Depression   . Dysphagia   . GERD (gastroesophageal reflux disease)   . Hepatitis    chronic hep c  . Hyperlipidemia   . Malignant neoplasm of colon ([place])   . Nerve pain   . Stroke [facility])     SURGICAL HISTORY: Past Surgical History:  Procedure Laterality Date  . COLON SURGERY     En bloc extended right hemicolectomy 07/2017  . CYSTOSCOPY W/ RETROGRADES Right 08/30/2018   Procedure: CYSTOSCOPY WITH RETROGRADE  PYELOGRAM;  Surgeon: Hollice Espy, MD;  Location: [facility] ORS;  Service: Urology;  Laterality: Right;  . CYSTOSCOPY WITH STENT PLACEMENT Right 04/25/2018   Procedure: CYSTOSCOPY WITH STENT PLACEMENT;  Surgeon: Hollice Espy, MD;  Location: [facility] ORS;  Service: Urology;  Laterality: Right;  . CYSTOSCOPY WITH STENT  PLACEMENT Right 08/30/2018   Procedure: [place] WITH STENT Exchange;  Surgeon: Hollice Espy, MD;  Location: [facility] ORS;  Service: Urology;  Laterality: Right;  . tumor removed       SOCIAL HISTORY: Social History   Socioeconomic History  . Marital status: Single    Spouse name: Not on file  . Number of children: Not on file  . Years of education: Not on file  . Highest education level: Not on file  Occupational History  . Not on file  Social Needs  . Financial resource strain: Not on file  . Food insecurity:    Worry: Not on file    Inability: Not on file  . Transportation needs:    Medical: Not on file    Non-medical: Not on file  Tobacco Use  . Smoking status: Current Every Day Smoker    Packs/day: 1.00    Types: Cigarettes  . Smokeless tobacco: Never Used  Substance and Sexual Activity  . Alcohol use: Not Currently    Frequency: Never  . Drug use: Not Currently  . Sexual activity: Not on file  Lifestyle  . Physical activity:    Days per week: Not on file    Minutes per session: Not on file  . Stress: Not on file  Relationships  . Social connections:    Talks on phone: Not on file    Gets together: Not on file    Attends religious service: Not on file    Active member of club or organization: Not on file    Attends meetings of clubs or organizations: Not on file    Relationship status: Not on file  . Intimate partner violence:    Fear of current or ex partner: Not on file    Emotionally abused: Not on file    Physically abused: Not on file    Forced sexual activity: Not on file  Other Topics Concern  . Not on file  Social History Narrative    used to live [place]; moved  To [place]- end of April 2019; in Nursing home; 1pp/day; quit alcohol. 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        Family- dad- prostate ca [at 60y]; brother- 41 died of prostate cancer; brother- 58- no cancers [[place]]; sonGerald [name] [Chad [name]];Chad [name]-32y prostate ca [facility] [place]]; daughter- 59 [[place]]; another  daughter 65 [[place]/addict]. will refer genetics counseling. Given MSI- abnormal; highly suspicious of Lynch syndrome.  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    FAMILY HISTORY: Family History  Problem Relation Age of Onset  . Prostate cancer Father     ALLERGIES:  is allergic to penicillins.  MEDICATIONS:  Current Outpatient Medications  Medication Sig Dispense Refill  . acetaminophen (TYLENOL) 325 MG tablet Take 650 mg by mouth 3 (three) times daily.     . diclofenac sodium (VOLTAREN) 1 % GEL Apply 2 g topically See admin instructions. Apply 2 g topically 4 times daily. Apply 2 g to left shoulder, apply 2 g to left hip and 2 g to left knee 4 times daily per [facility] instructions    . escitalopram (LEXAPRO) 20 MG tablet Take 1 tablet (20 mg  total) by mouth daily. 90 tablet 3  . gabapentin (NEURONTIN) 100 MG capsule Take one to 2 tabs for pain 3 x day (Patient taking differently: Take 100 mg by mouth 3 (three) times daily. ) 180 capsule 3  . mirtazapine (REMERON) 7.5 MG tablet Take 7.5 mg by mouth at bedtime.    Marland Kitchen oxyCODONE (OXY IR/ROXICODONE) 5 MG immediate release tablet Take 5 mg by mouth every 6 (six) hours.     . pantoprazole (PROTONIX) 40 MG tablet Take 40 mg by mouth daily.     No current facility-administered medications for this visit.       Marland Kitchen  PHYSICAL EXAMINATION: ECOG PERFORMANCE STATUS: 1 - Symptomatic but completely ambulatory  Vitals:   12/13/18 1135  BP: 111/73  Pulse: 82  Resp: 18  Temp: (!) 97.4 F (36.3 C)  SpO2: 97%   Filed Weights   12/13/18 1135  Weight: 151 lb 11.2 oz (68.8 kg)    Physical Exam  Constitutional: He is oriented to person, place, and time. No distress.  In wheelchair. Cachectic.  Appears older than his stated age.  Alone.  HENT:  Head: Normocephalic and atraumatic.  Nose: Nose normal.  Mouth/Throat: Oropharynx is clear and moist. No oropharyngeal exudate.  Eyes: Pupils are equal, round, and reactive to light.  EOM are normal. No scleral icterus.  Neck: Normal range of motion. Neck supple.  Cardiovascular: Normal rate and regular rhythm.  No murmur heard. Pulmonary/Chest: Effort normal. No respiratory distress. He has no wheezes. He has no rales. He exhibits no tenderness.  Abdominal: Soft. Bowel sounds are normal. He exhibits no distension and no mass. There is no abdominal tenderness. There is no rebound and no guarding.  Musculoskeletal: Normal range of motion.        General: No tenderness or edema.  Neurological: He is alert and oriented to person, place, and time. No cranial nerve deficit. He exhibits normal muscle tone. Coordination normal.  Weakness left upper and lower extremity.  Skin: Skin is warm and dry. He is not diaphoretic. No erythema.  Bilateral lower extremity ulcerations noted.  Pulses intact bilaterally.  Psychiatric: Affect normal.     LABORATORY DATA:  I have reviewed the data as listed Lab Results  Component Value Date   WBC 7.3 12/13/2018   HGB 12.3 (L) 12/13/2018   HCT 38.4 (L) 12/13/2018   MCV 82.2 12/13/2018   PLT 179 12/13/2018   Recent Labs    10/25/18 0919 11/22/18 1006 12/13/18 1030  NA 137 138 136  K 4.0 4.1 4.1  CL 106 105 106  CO2 '23 25 23  ' GLUCOSE 105* 94 93  BUN 17 18 21*  CREATININE 0.95 0.98 0.94  CALCIUM 8.6* 8.8* 8.8*  GFRNONAA >60 >60 >60  GFRAA >60 >60 >60  PROT 7.6 8.0 8.1  ALBUMIN 3.4* 3.6 3.8  AST '21 21 17  ' ALT '10 8 8  ' ALKPHOS 93 88 87  BILITOT 0.5 0.5 0.6    RADIOGRAPHIC STUDIES: I have personally reviewed the radiological images as listed and agreed with the findings in the report. Ct Chest W Contrast  Result Date: 12/11/2018 CLINICAL DATA:  Right renal pelvis/ureteral cancer, status post stent placement. History of right colon cancer, diagnosed 2019. EXAM: CT CHEST, ABDOMEN, AND PELVIS WITH CONTRAST TECHNIQUE: Multidetector CT imaging of the chest, abdomen and pelvis was performed following the standard protocol during  bolus administration of intravenous contrast. CONTRAST:  167m OMNIPAQUE IOHEXOL 300 MG/ML  SOLN COMPARISON:  08/19/2018 FINDINGS:  CT CHEST FINDINGS Cardiovascular: Heart is normal in size.  No pericardial effusion. No evidence of thoracic aortic aneurysm. Mild atherosclerotic calcifications of the aortic arch. Coronary atherosclerosis the LAD. Mediastinum/Nodes: Small mediastinal lymph nodes, including: --9 mm short axis left axillary node (series 2/image 21), previously 10 mm --8 mm short axis subcarinal node (series 2/image 32), unchanged --8 mm short axis right hilar node (series 2/image 32), previously 7 mm Visualized thyroid is unremarkable. Lungs/Pleura: Minimal subpleural scarring in the lateral left upper lobe (series 3/image 85). Mild dependent atelectasis in the bilateral lower lobes. No focal consolidation. No suspicious pulmonary nodules. No pleural effusion or pneumothorax. Musculoskeletal: Visualized osseous structures are within normal limits. CT ABDOMEN PELVIS FINDINGS Hepatobiliary: Liver is within normal limits. Gallbladder is unremarkable. No intrahepatic or extrahepatic ductal dilatation. Pancreas: Within normal limits. Spleen: Within normal limits. Adrenals/Urinary Tract: Adrenal glands are within normal limits. 1.6 cm anterior left lower pole renal cyst (series 9/image 21). Left kidney is otherwise within normal limits. Mild right renal atrophy with diminished/delayed enhancement. Two nonobstructing right lower pole renal calculi measuring up to 5 x 2 mm (series 2/image 79). Persistent urothelial thickening involving the right renal pelvis/proximal collecting system (series 2/image 35), corresponding to the patient's known urothelial carcinoma. Indwelling right double-pigtail ureteral stent. No hydronephrosis. Bladder is within normal limits. Stomach/Bowel: Stomach is within normal limits. No evidence of bowel obstruction. Status post right hemicolectomy with appendectomy. Mild left colonic  stool burden. Fat density lesions with mild inflammatory changes in the left lower quadrant adjacent to the sigmoid colon suggest epiploic appendagitis versus omental infarcts (series 2/image 111). Vascular/Lymphatic: No evidence of abdominal aortic aneurysm. Small upper abdominal lymph nodes, including a 10 mm short axis portacaval node (series 2/image 63), unchanged. Small retroperitoneal nodes, including a 7 mm short axis left para-aortic node, previously 8 mm. Small pelvic lymph nodes, including: --11 mm short axis left external iliac node (series 2/image 104), previously 12 mm --13 mm short axis left pelvic sidewall node (series 2/image 108), previously 14 mm --9 mm short axis right pelvic sidewall node (series 2/image 111), previously 11 mm Reproductive: Prostate is grossly unremarkable. Other: No abdominopelvic ascites. Postsurgical changes in the right inguinal region. Musculoskeletal: Visualized osseous structures are within normal limits. IMPRESSION: Urothelial thickening involving the right renal pelvis/proximal collecting system, corresponding to the patient's known urothelial cancer. Associated right double-pigtail stent in satisfactory position. Secondary atrophy of the right kidney. No hydronephrosis. Status post right hemicolectomy. Small mediastinal and abdominopelvic lymph nodes, as described above, overall unchanged. Suspected omental infarcts versus epiploic appendagitis in the left lower quadrant, adjacent to the sigmoid colon, new. Electronically Signed   By: Julian Hy M.D.   On: 12/11/2018 09:23   Ct Abdomen Pelvis W Contrast  Result Date: 12/11/2018 CLINICAL DATA:  Right renal pelvis/ureteral cancer, status post stent placement. History of right colon cancer, diagnosed 2019. EXAM: CT CHEST, ABDOMEN, AND PELVIS WITH CONTRAST TECHNIQUE: Multidetector CT imaging of the chest, abdomen and pelvis was performed following the standard protocol during bolus administration of intravenous  contrast. CONTRAST:  129m OMNIPAQUE IOHEXOL 300 MG/ML  SOLN COMPARISON:  08/19/2018 FINDINGS: CT CHEST FINDINGS Cardiovascular: Heart is normal in size.  No pericardial effusion. No evidence of thoracic aortic aneurysm. Mild atherosclerotic calcifications of the aortic arch. Coronary atherosclerosis the LAD. Mediastinum/Nodes: Small mediastinal lymph nodes, including: --9 mm short axis left axillary node (series 2/image 21), previously 10 mm --8 mm short axis subcarinal node (series 2/image 32), unchanged --8 mm short axis right  hilar node (series 2/image 32), previously 7 mm Visualized thyroid is unremarkable. Lungs/Pleura: Minimal subpleural scarring in the lateral left upper lobe (series 3/image 85). Mild dependent atelectasis in the bilateral lower lobes. No focal consolidation. No suspicious pulmonary nodules. No pleural effusion or pneumothorax. Musculoskeletal: Visualized osseous structures are within normal limits. CT ABDOMEN PELVIS FINDINGS Hepatobiliary: Liver is within normal limits. Gallbladder is unremarkable. No intrahepatic or extrahepatic ductal dilatation. Pancreas: Within normal limits. Spleen: Within normal limits. Adrenals/Urinary Tract: Adrenal glands are within normal limits. 1.6 cm anterior left lower pole renal cyst (series 9/image 21). Left kidney is otherwise within normal limits. Mild right renal atrophy with diminished/delayed enhancement. Two nonobstructing right lower pole renal calculi measuring up to 5 x 2 mm (series 2/image 79). Persistent urothelial thickening involving the right renal pelvis/proximal collecting system (series 2/image 35), corresponding to the patient's known urothelial carcinoma. Indwelling right double-pigtail ureteral stent. No hydronephrosis. Bladder is within normal limits. Stomach/Bowel: Stomach is within normal limits. No evidence of bowel obstruction. Status post right hemicolectomy with appendectomy. Mild left colonic stool burden. Fat density lesions with  mild inflammatory changes in the left lower quadrant adjacent to the sigmoid colon suggest epiploic appendagitis versus omental infarcts (series 2/image 111). Vascular/Lymphatic: No evidence of abdominal aortic aneurysm. Small upper abdominal lymph nodes, including a 10 mm short axis portacaval node (series 2/image 63), unchanged. Small retroperitoneal nodes, including a 7 mm short axis left para-aortic node, previously 8 mm. Small pelvic lymph nodes, including: --11 mm short axis left external iliac node (series 2/image 104), previously 12 mm --13 mm short axis left pelvic sidewall node (series 2/image 108), previously 14 mm --9 mm short axis right pelvic sidewall node (series 2/image 111), previously 11 mm Reproductive: Prostate is grossly unremarkable. Other: No abdominopelvic ascites. Postsurgical changes in the right inguinal region. Musculoskeletal: Visualized osseous structures are within normal limits. IMPRESSION: Urothelial thickening involving the right renal pelvis/proximal collecting system, corresponding to the patient's known urothelial cancer. Associated right double-pigtail stent in satisfactory position. Secondary atrophy of the right kidney. No hydronephrosis. Status post right hemicolectomy. Small mediastinal and abdominopelvic lymph nodes, as described above, overall unchanged. Suspected omental infarcts versus epiploic appendagitis in the left lower quadrant, adjacent to the sigmoid colon, new. Electronically Signed   By: Julian Hy M.D.   On: 12/11/2018 09:23    ASSESSMENT & PLAN:  This is a 58 year old male with history of high-grade urothelial cancer currently on Keytruda treatment present for discussion of image results and evaluation prior to immunotherapy treatments. 1. Ureteral cancer, right ([place])   2. Encounter for antineoplastic immunotherapy   3. Cancer of right colon ([place])   4. Normocytic anemia    Interval CT chest abdomen pelvis with contrast images were  independently reviewed by me and discussed with patient.  All questions were answered. The patient knows to call the clinic with any problems, questions or concerns. Relatively stable mediastinal lymph nodes, and pelvic lymph nodes. Suspected omental infarcts, versus epiploic appendagitis in the left lower quadrant, Patient is asymptomatic, denies any abdominal pain. Recommend to continue immunotherapy with Keytruda. Labs are reviewed and discussed with patient.  Counts acceptable to proceed with Keytruda.   # Colon cancer, stable.  # Anemia stable hemoglobin. Monitor # Left Knee pain/arthritis. Ortho eval pending.   He will return to clinic in 3 weeks with labs, MD assessment with Dr. [name].    Earlie Server, MD 12/13/2018 5:07 PM

## 2018-12-13 NOTE — Progress Notes (Signed)
Patient here for follow up. Had CT abd pelvis on Friday and is wanting to know results.

## 2018-12-25 ENCOUNTER — Encounter: Payer: Self-pay | Admitting: Oncology

## 2018-12-25 DIAGNOSIS — Z7189 Other specified counseling: Secondary | ICD-10-CM | POA: Insufficient documentation

## 2019-01-03 ENCOUNTER — Other Ambulatory Visit: Payer: Self-pay

## 2019-01-03 ENCOUNTER — Inpatient Hospital Stay: Payer: Medicaid Other | Attending: Internal Medicine

## 2019-01-03 ENCOUNTER — Inpatient Hospital Stay: Payer: Medicaid Other

## 2019-01-03 ENCOUNTER — Inpatient Hospital Stay (HOSPITAL_BASED_OUTPATIENT_CLINIC_OR_DEPARTMENT_OTHER): Payer: Medicaid Other | Admitting: Internal Medicine

## 2019-01-03 ENCOUNTER — Encounter: Payer: Self-pay | Admitting: Internal Medicine

## 2019-01-03 VITALS — BP 120/80 | Temp 95.1°F | Resp 20 | Ht 67.0 in | Wt 148.6 lb

## 2019-01-03 DIAGNOSIS — G629 Polyneuropathy, unspecified: Secondary | ICD-10-CM | POA: Diagnosis not present

## 2019-01-03 DIAGNOSIS — E785 Hyperlipidemia, unspecified: Secondary | ICD-10-CM

## 2019-01-03 DIAGNOSIS — Z8546 Personal history of malignant neoplasm of prostate: Secondary | ICD-10-CM

## 2019-01-03 DIAGNOSIS — R161 Splenomegaly, not elsewhere classified: Secondary | ICD-10-CM | POA: Diagnosis not present

## 2019-01-03 DIAGNOSIS — F419 Anxiety disorder, unspecified: Secondary | ICD-10-CM

## 2019-01-03 DIAGNOSIS — C189 Malignant neoplasm of colon, unspecified: Secondary | ICD-10-CM | POA: Diagnosis not present

## 2019-01-03 DIAGNOSIS — C661 Malignant neoplasm of right ureter: Secondary | ICD-10-CM

## 2019-01-03 DIAGNOSIS — F17210 Nicotine dependence, cigarettes, uncomplicated: Secondary | ICD-10-CM | POA: Insufficient documentation

## 2019-01-03 DIAGNOSIS — K7460 Unspecified cirrhosis of liver: Secondary | ICD-10-CM | POA: Diagnosis not present

## 2019-01-03 DIAGNOSIS — Z85038 Personal history of other malignant neoplasm of large intestine: Secondary | ICD-10-CM

## 2019-01-03 DIAGNOSIS — R5381 Other malaise: Secondary | ICD-10-CM | POA: Insufficient documentation

## 2019-01-03 DIAGNOSIS — J449 Chronic obstructive pulmonary disease, unspecified: Secondary | ICD-10-CM

## 2019-01-03 DIAGNOSIS — R5383 Other fatigue: Secondary | ICD-10-CM | POA: Insufficient documentation

## 2019-01-03 DIAGNOSIS — Z5112 Encounter for antineoplastic immunotherapy: Secondary | ICD-10-CM | POA: Diagnosis not present

## 2019-01-03 DIAGNOSIS — K219 Gastro-esophageal reflux disease without esophagitis: Secondary | ICD-10-CM

## 2019-01-03 DIAGNOSIS — L97919 Non-pressure chronic ulcer of unspecified part of right lower leg with unspecified severity: Secondary | ICD-10-CM | POA: Diagnosis not present

## 2019-01-03 DIAGNOSIS — Z791 Long term (current) use of non-steroidal anti-inflammatories (NSAID): Secondary | ICD-10-CM

## 2019-01-03 DIAGNOSIS — Z8673 Personal history of transient ischemic attack (TIA), and cerebral infarction without residual deficits: Secondary | ICD-10-CM

## 2019-01-03 DIAGNOSIS — D509 Iron deficiency anemia, unspecified: Secondary | ICD-10-CM | POA: Insufficient documentation

## 2019-01-03 DIAGNOSIS — Z79899 Other long term (current) drug therapy: Secondary | ICD-10-CM | POA: Insufficient documentation

## 2019-01-03 DIAGNOSIS — L97929 Non-pressure chronic ulcer of unspecified part of left lower leg with unspecified severity: Secondary | ICD-10-CM | POA: Diagnosis not present

## 2019-01-03 DIAGNOSIS — G8929 Other chronic pain: Secondary | ICD-10-CM | POA: Insufficient documentation

## 2019-01-03 DIAGNOSIS — B182 Chronic viral hepatitis C: Secondary | ICD-10-CM | POA: Diagnosis not present

## 2019-01-03 DIAGNOSIS — M549 Dorsalgia, unspecified: Secondary | ICD-10-CM | POA: Diagnosis not present

## 2019-01-03 LAB — CBC WITH DIFFERENTIAL/PLATELET
Abs Immature Granulocytes: 0.01 10*3/uL (ref 0.00–0.07)
Basophils Absolute: 0.1 10*3/uL (ref 0.0–0.1)
Basophils Relative: 1 %
Eosinophils Absolute: 0.4 10*3/uL (ref 0.0–0.5)
Eosinophils Relative: 6 %
HCT: 40.2 % (ref 39.0–52.0)
Hemoglobin: 12.9 g/dL — ABNORMAL LOW (ref 13.0–17.0)
Immature Granulocytes: 0 %
Lymphocytes Relative: 16 %
Lymphs Abs: 1 10*3/uL (ref 0.7–4.0)
MCH: 26.3 pg (ref 26.0–34.0)
MCHC: 32.1 g/dL (ref 30.0–36.0)
MCV: 81.9 fL (ref 80.0–100.0)
Monocytes Absolute: 0.5 10*3/uL (ref 0.1–1.0)
Monocytes Relative: 9 %
Neutro Abs: 4.1 10*3/uL (ref 1.7–7.7)
Neutrophils Relative %: 68 %
Platelets: 179 10*3/uL (ref 150–400)
RBC: 4.91 MIL/uL (ref 4.22–5.81)
RDW: 14.4 % (ref 11.5–15.5)
WBC: 6.1 10*3/uL (ref 4.0–10.5)
nRBC: 0 % (ref 0.0–0.2)

## 2019-01-03 LAB — COMPREHENSIVE METABOLIC PANEL
ALT: 8 U/L (ref 0–44)
AST: 17 U/L (ref 15–41)
Albumin: 4 g/dL (ref 3.5–5.0)
Alkaline Phosphatase: 92 U/L (ref 38–126)
Anion gap: 9 (ref 5–15)
BUN: 22 mg/dL — ABNORMAL HIGH (ref 6–20)
CO2: 23 mmol/L (ref 22–32)
Calcium: 9 mg/dL (ref 8.9–10.3)
Chloride: 103 mmol/L (ref 98–111)
Creatinine, Ser: 0.96 mg/dL (ref 0.61–1.24)
GFR calc Af Amer: >60 mL/min (ref >60)
GFR calc non Af Amer: >60 mL/min (ref >60)
Glucose, Bld: 88 mg/dL (ref 70–99)
Potassium: 4 mmol/L (ref 3.5–5.1)
Sodium: 135 mmol/L (ref 135–145)
Total Bilirubin: 0.9 mg/dL (ref 0.3–1.2)
Total Protein: 8.5 g/dL — ABNORMAL HIGH (ref 6.5–8.1)

## 2019-01-03 NOTE — Assessment & Plan Note (Addendum)
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 05/15-C/A/P-STABLE; right ureteral thickening/small pelvic/retroperitoneal lymph nodes.  # Proceed with cycle # 10 of keytruda today. Labs today reviewed;  acceptable for treatment today.  #Colon cancer.  Stage IIII-stable  #Iron deficiency anemia-hemoglobin 12. Stable.    # Left knee pain/arthritic pain-stable  #Cirrhosis/ spleenomegaly-Hepatitis C-positive/HCV RNA positive.  Stable  # Bilateral LE ulcers-s/p wound care evaluation; improved.   #DISPOSITION:  [name] today. # follow up 3 weeks -MD labs-cbc/cmp/Thyroid profile-/MD/Keytruda; - Dr.B  Addendum: Patient had difficulty with obtaining IV access; and did not get Keytruda today.  Patient come back on 10th June/4 consideration of IV Keytruda.  If difficulty with IV access would recommend port placement.

## 2019-01-03 NOTE — Progress Notes (Signed)
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; # Colon ca- stage III  GOALS: palliative  CURRENT/MOST RECENT THERAPY: Keytruda      Urothelial cancer ([place])    Initial Diagnosis    Urothelial cancer ([place])     Ureteral cancer, right ([place])   05/26/2018 Initial Diagnosis    Ureteral cancer, right ([place])    06/21/2018 -  Chemotherapy    The patient had pembrolizumab (KEYTRUDA) 200 mg in sodium chloride 0.9 % 50 mL chemo infusion, 200 mg, Intravenous, Once, 9 of 13 cycles Administration: 200 mg (06/21/2018), 200 mg (07/12/2018), 200 mg (08/02/2018), 200 mg (08/23/2018), 200 mg (09/13/2018), 200 mg (10/04/2018), 200 mg (10/25/2018), 200 mg (11/22/2018), 200 mg (12/13/2018)  for chemotherapy treatment.       HISTORY OF PRESENTING ILLNESS: Patient is a poor historian.  Is alone.  Tresean Mattix 58 y.o.  male above history of stage IV-ureteral cancer/with multiple co-morbidities currently on Keytruda is here for follow-up.  Patient continues to get wound care at his nursing home for his bilateral lower extremity ulcers.  These are getting worse.  No diarrhea.  No blood in stools or blood in urine.  Appetite is fair.  No skin rash. No fever no chills.  Review of Systems  Constitutional: Positive for malaise/fatigue. Negative for chills, diaphoresis, fever and weight loss.  HENT: Negative for nosebleeds and sore throat.   Eyes: Negative for double vision.   Respiratory: Positive for cough and shortness of breath. Negative for hemoptysis and wheezing.   Cardiovascular: Positive for leg swelling. Negative for chest pain, palpitations and orthopnea.  Gastrointestinal: Negative for abdominal pain, blood in stool, constipation, diarrhea, heartburn, melena, nausea and vomiting.  Genitourinary: Negative for dysuria, frequency and urgency.  Musculoskeletal: Positive for back pain and joint pain.  Skin: Negative.  Negative for itching and rash.  Neurological: Positive for focal weakness. Negative for dizziness, tingling, weakness and headaches.       Chronic left-sided weakness upper than lower extremity.  Endo/Heme/Allergies: Does not bruise/bleed easily.  Psychiatric/Behavioral: Negative for depression. The patient is not nervous/anxious and does not have insomnia.      MEDICAL HISTORY:  Past Medical History:  Diagnosis Date  . Anemia   . Anxiety   . ARF (acute respiratory failure) ([place])   . Bladder cancer ([place])   . Cancer [facility])    prostate  . COPD (chronic obstructive pulmonary disease) ([place])   . Depression   . Dysphagia   . GERD (gastroesophageal reflux disease)   . Hepatitis    chronic hep c  . Hyperlipidemia   . Malignant neoplasm of colon ([place])   . Nerve pain   . Stroke [facility])     SURGICAL HISTORY: Past Surgical History:  Procedure Laterality Date  . COLON SURGERY     En bloc extended right hemicolectomy 07/2017  . CYSTOSCOPY W/ RETROGRADES Right 08/30/2018   Procedure: CYSTOSCOPY WITH RETROGRADE PYELOGRAM;  Surgeon: Hollice Espy, MD;  Location:  [facility] ORS;  Service: Urology;  Laterality: Right;  . CYSTOSCOPY WITH STENT PLACEMENT Right 04/25/2018   Procedure: CYSTOSCOPY WITH STENT PLACEMENT;  Surgeon: Hollice Espy, MD;  Location: [facility] ORS;  Service: Urology;  Laterality: Right;  . CYSTOSCOPY WITH STENT PLACEMENT Right 08/30/2018   Procedure: Ceres WITH STENT Exchange;  Surgeon: Hollice Espy, MD;  Location: [facility] ORS;   Service: Urology;  Laterality: Right;  . tumor removed       SOCIAL HISTORY: Social History   Socioeconomic History  . Marital status: Single    Spouse name: Not on file  . Number of children: Not on file  . Years of education: Not on file  . Highest education level: Not on file  Occupational History  . Not on file  Social Needs  . Financial resource strain: Not on file  . Food insecurity:    Worry: Not on file    Inability: Not on file  . Transportation needs:    Medical: Not on file    Non-medical: Not on file  Tobacco Use  . Smoking status: Current Every Day Smoker    Packs/day: 1.00    Types: Cigarettes  . Smokeless tobacco: Never Used  Substance and Sexual Activity  . Alcohol use: Not Currently    Frequency: Never  . Drug use: Not Currently  . Sexual activity: Not on file  Lifestyle  . Physical activity:    Days per week: Not on file    Minutes per session: Not on file  . Stress: Not on file  Relationships  . Social connections:    Talks on phone: Not on file    Gets together: Not on file    Attends religious service: Not on file    Active member of club or organization: Not on file    Attends meetings of clubs or organizations: Not on file    Relationship status: Not on file  . Intimate partner violence:    Fear of current or ex partner: Not on file    Emotionally abused: Not on file    Physically abused: Not on file    Forced sexual activity: Not on file  Other Topics Concern  . Not on file  Social History Narrative    used to live [place]; moved  To [place]- end of April 2019; in Nursing home; 1pp/day; quit alcohol. 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        Family- dad- prostate ca [at 25y]; brother- 68 died of prostate cancer; brother- 94- no cancers [[place]]; sonGerald Stabs [[place]];[name]-32y prostate ca [facility] [place]]; daughter- 50 [[place]]; another daughter 56 [[place]/addict]. will refer genetics counseling. Given MSI- abnormal; highly suspicious of Lynch syndrome.   Patient's son [name] aware of high possible lynch syndrome.    FAMILY HISTORY: Family History  Problem Relation Age of Onset  . Prostate cancer Father     ALLERGIES:  is allergic to penicillins.  MEDICATIONS:  Current Outpatient Medications  Medication Sig Dispense Refill  . acetaminophen (TYLENOL) 325 MG tablet Take 650 mg by mouth 3 (three) times daily.     . diclofenac sodium (VOLTAREN) 1 % GEL Apply 2 g topically See admin instructions. Apply 2 g topically 4 times daily. Apply 2 g to left shoulder, apply 2 g to left hip and 2 g to left knee 4 times daily per [facility] instructions    . escitalopram (LEXAPRO) 20 MG tablet Take 1 tablet (20 mg total) by mouth daily. 90 tablet 3  .  gabapentin (NEURONTIN) 100 MG capsule Take one to 2 tabs for pain 3 x day (Patient taking differently: Take 100 mg by mouth 3 (three) times daily. ) 180 capsule 3  . mirtazapine (REMERON) 7.5 MG tablet Take 7.5 mg by mouth at bedtime.    Marland Kitchen oxyCODONE (OXY IR/ROXICODONE) 5 MG immediate release tablet Take 5 mg by mouth every 6 (six) hours.     . pantoprazole (PROTONIX) 40 MG tablet Take 40 mg by mouth daily.     No current facility-administered medications for this visit.       Marland Kitchen  PHYSICAL EXAMINATION: ECOG PERFORMANCE STATUS: 1 - Symptomatic but completely ambulatory  Vitals:   01/03/19 0927  BP: 120/80  Resp: 20  Temp: (!) 95.1 F (35.1 C)   Filed Weights   01/03/19 0927  Weight: 148 lb 9.6 oz (67.4 kg)    Physical Exam  Constitutional: He is oriented to person, place, and time.  In wheelchair. Cachectic.  Appears older than his stated age.  Alone.  HENT:  Head: Normocephalic and atraumatic.  Mouth/Throat: Oropharynx is clear and moist. No oropharyngeal exudate.  Eyes: Pupils are equal, round, and reactive to light.  Neck: Normal range of motion. Neck supple.  Cardiovascular: Normal rate and regular rhythm.  Pulmonary/Chest: No respiratory distress. He has no wheezes.  Abdominal: Soft.  Bowel sounds are normal. He exhibits no distension and no mass. There is no abdominal tenderness. There is no rebound and no guarding.  Musculoskeletal: Normal range of motion.        General: No tenderness or edema.  Neurological: He is alert and oriented to person, place, and time.  Weakness left upper and lower extremity.  Skin: Skin is warm.  Bilateral lower extremity ulcerations noted.  Pulses intact bilaterally.  Psychiatric: Affect normal.     LABORATORY DATA:  I have reviewed the data as listed Lab Results  Component Value Date   WBC 6.1 01/03/2019   HGB 12.9 (L) 01/03/2019   HCT 40.2 01/03/2019   MCV 81.9 01/03/2019   PLT 179 01/03/2019   Recent Labs    11/22/18 1006 12/13/18 1030 01/03/19 0903  NA 138 136 135  K 4.1 4.1 4.0  CL 105 106 103  CO2 '25 23 23  ' GLUCOSE 94 93 88  BUN 18 21* 22*  CREATININE 0.98 0.94 0.96  CALCIUM 8.8* 8.8* 9.0  GFRNONAA >60 >60 >60  GFRAA >60 >60 >60  PROT 8.0 8.1 8.5*  ALBUMIN 3.6 3.8 4.0  AST '21 17 17  ' ALT '8 8 8  ' ALKPHOS 88 87 92  BILITOT 0.5 0.6 0.9    RADIOGRAPHIC STUDIES: I have personally reviewed the radiological images as listed and agreed with the findings in the report. Ct Chest W Contrast  Result Date: 12/11/2018 CLINICAL DATA:  Right renal pelvis/ureteral cancer, status post stent placement. History of right colon cancer, diagnosed 2019. EXAM: CT CHEST, ABDOMEN, AND PELVIS WITH CONTRAST TECHNIQUE: Multidetector CT imaging of the chest, abdomen and pelvis was performed following the standard protocol during bolus administration of intravenous contrast. CONTRAST:  126m OMNIPAQUE IOHEXOL 300 MG/ML  SOLN COMPARISON:  08/19/2018 FINDINGS: CT CHEST FINDINGS Cardiovascular: Heart is normal in size.  No pericardial effusion. No evidence of thoracic aortic aneurysm. Mild atherosclerotic calcifications of the aortic arch. Coronary atherosclerosis the LAD. Mediastinum/Nodes: Small mediastinal lymph nodes, including: --9 mm short  axis left axillary node (series 2/image 21), previously 10 mm --8 mm short axis subcarinal node (series 2/image 32), unchanged --8  mm short axis right hilar node (series 2/image 32), previously 7 mm Visualized thyroid is unremarkable. Lungs/Pleura: Minimal subpleural scarring in the lateral left upper lobe (series 3/image 85). Mild dependent atelectasis in the bilateral lower lobes. No focal consolidation. No suspicious pulmonary nodules. No pleural effusion or pneumothorax. Musculoskeletal: Visualized osseous structures are within normal limits. CT ABDOMEN PELVIS FINDINGS Hepatobiliary: Liver is within normal limits. Gallbladder is unremarkable. No intrahepatic or extrahepatic ductal dilatation. Pancreas: Within normal limits. Spleen: Within normal limits. Adrenals/Urinary Tract: Adrenal glands are within normal limits. 1.6 cm anterior left lower pole renal cyst (series 9/image 21). Left kidney is otherwise within normal limits. Mild right renal atrophy with diminished/delayed enhancement. Two nonobstructing right lower pole renal calculi measuring up to 5 x 2 mm (series 2/image 79). Persistent urothelial thickening involving the right renal pelvis/proximal collecting system (series 2/image 35), corresponding to the patient's known urothelial carcinoma. Indwelling right double-pigtail ureteral stent. No hydronephrosis. Bladder is within normal limits. Stomach/Bowel: Stomach is within normal limits. No evidence of bowel obstruction. Status post right hemicolectomy with appendectomy. Mild left colonic stool burden. Fat density lesions with mild inflammatory changes in the left lower quadrant adjacent to the sigmoid colon suggest epiploic appendagitis versus omental infarcts (series 2/image 111). Vascular/Lymphatic: No evidence of abdominal aortic aneurysm. Small upper abdominal lymph nodes, including a 10 mm short axis portacaval node (series 2/image 63), unchanged. Small retroperitoneal nodes, including a 7 mm short  axis left para-aortic node, previously 8 mm. Small pelvic lymph nodes, including: --11 mm short axis left external iliac node (series 2/image 104), previously 12 mm --13 mm short axis left pelvic sidewall node (series 2/image 108), previously 14 mm --9 mm short axis right pelvic sidewall node (series 2/image 111), previously 11 mm Reproductive: Prostate is grossly unremarkable. Other: No abdominopelvic ascites. Postsurgical changes in the right inguinal region. Musculoskeletal: Visualized osseous structures are within normal limits. IMPRESSION: Urothelial thickening involving the right renal pelvis/proximal collecting system, corresponding to the patient's known urothelial cancer. Associated right double-pigtail stent in satisfactory position. Secondary atrophy of the right kidney. No hydronephrosis. Status post right hemicolectomy. Small mediastinal and abdominopelvic lymph nodes, as described above, overall unchanged. Suspected omental infarcts versus epiploic appendagitis in the left lower quadrant, adjacent to the sigmoid colon, new. Electronically Signed   By: Julian Hy M.D.   On: 12/11/2018 09:23   Ct Abdomen Pelvis W Contrast  Result Date: 12/11/2018 CLINICAL DATA:  Right renal pelvis/ureteral cancer, status post stent placement. History of right colon cancer, diagnosed 2019. EXAM: CT CHEST, ABDOMEN, AND PELVIS WITH CONTRAST TECHNIQUE: Multidetector CT imaging of the chest, abdomen and pelvis was performed following the standard protocol during bolus administration of intravenous contrast. CONTRAST:  165m OMNIPAQUE IOHEXOL 300 MG/ML  SOLN COMPARISON:  08/19/2018 FINDINGS: CT CHEST FINDINGS Cardiovascular: Heart is normal in size.  No pericardial effusion. No evidence of thoracic aortic aneurysm. Mild atherosclerotic calcifications of the aortic arch. Coronary atherosclerosis the LAD. Mediastinum/Nodes: Small mediastinal lymph nodes, including: --9 mm short axis left axillary node (series 2/image  21), previously 10 mm --8 mm short axis subcarinal node (series 2/image 32), unchanged --8 mm short axis right hilar node (series 2/image 32), previously 7 mm Visualized thyroid is unremarkable. Lungs/Pleura: Minimal subpleural scarring in the lateral left upper lobe (series 3/image 85). Mild dependent atelectasis in the bilateral lower lobes. No focal consolidation. No suspicious pulmonary nodules. No pleural effusion or pneumothorax. Musculoskeletal: Visualized osseous structures are within normal limits. CT ABDOMEN PELVIS FINDINGS Hepatobiliary: Liver is  within normal limits. Gallbladder is unremarkable. No intrahepatic or extrahepatic ductal dilatation. Pancreas: Within normal limits. Spleen: Within normal limits. Adrenals/Urinary Tract: Adrenal glands are within normal limits. 1.6 cm anterior left lower pole renal cyst (series 9/image 21). Left kidney is otherwise within normal limits. Mild right renal atrophy with diminished/delayed enhancement. Two nonobstructing right lower pole renal calculi measuring up to 5 x 2 mm (series 2/image 79). Persistent urothelial thickening involving the right renal pelvis/proximal collecting system (series 2/image 35), corresponding to the patient's known urothelial carcinoma. Indwelling right double-pigtail ureteral stent. No hydronephrosis. Bladder is within normal limits. Stomach/Bowel: Stomach is within normal limits. No evidence of bowel obstruction. Status post right hemicolectomy with appendectomy. Mild left colonic stool burden. Fat density lesions with mild inflammatory changes in the left lower quadrant adjacent to the sigmoid colon suggest epiploic appendagitis versus omental infarcts (series 2/image 111). Vascular/Lymphatic: No evidence of abdominal aortic aneurysm. Small upper abdominal lymph nodes, including a 10 mm short axis portacaval node (series 2/image 63), unchanged. Small retroperitoneal nodes, including a 7 mm short axis left para-aortic node, previously  8 mm. Small pelvic lymph nodes, including: --11 mm short axis left external iliac node (series 2/image 104), previously 12 mm --13 mm short axis left pelvic sidewall node (series 2/image 108), previously 14 mm --9 mm short axis right pelvic sidewall node (series 2/image 111), previously 11 mm Reproductive: Prostate is grossly unremarkable. Other: No abdominopelvic ascites. Postsurgical changes in the right inguinal region. Musculoskeletal: Visualized osseous structures are within normal limits. IMPRESSION: Urothelial thickening involving the right renal pelvis/proximal collecting system, corresponding to the patient's known urothelial cancer. Associated right double-pigtail stent in satisfactory position. Secondary atrophy of the right kidney. No hydronephrosis. Status post right hemicolectomy. Small mediastinal and abdominopelvic lymph nodes, as described above, overall unchanged. Suspected omental infarcts versus epiploic appendagitis in the left lower quadrant, adjacent to the sigmoid colon, new. Electronically Signed   By: Julian Hy M.D.   On: 12/11/2018 09:23    ASSESSMENT & PLAN:   Ureteral cancer, right ([place]) # High-grade urothelial cancer/cytology; likely of the right renal pelvis /upper ureter. 05/15-C/A/P-STABLE; right ureteral thickening/small pelvic/retroperitoneal lymph nodes.  # Proceed with cycle # 10 of keytruda today. Labs today reviewed;  acceptable for treatment today.  #Colon cancer.  Stage IIII-stable  #Iron deficiency anemia-hemoglobin 12. Stable.    # Left knee pain/arthritic pain-stable  #Cirrhosis/ spleenomegaly-Hepatitis C-positive/HCV RNA positive.  Stable  # Bilateral LE ulcers-s/p wound care evaluation; improved.   #DISPOSITION:  [name] today. # follow up 3 weeks -MD labs-cbc/cmp/Thyroid profile-/MD/Keytruda; - Dr.B  Addendum: Patient had difficulty with obtaining IV access; and did not get Keytruda today.  Patient come back on 10th June/4  consideration of IV Keytruda.  If difficulty with IV access would recommend port placement.    All questions were answered. The patient knows to call the clinic with any problems, questions or concerns.    Cammie Sickle, MD 01/03/2019 1:51 PM

## 2019-01-03 NOTE — Progress Notes (Signed)
Unable to obtain IV access, MD notified, pt instructed to return to clinic Wednesday and encouraged to drink plenty of fluids prior to arrival.

## 2019-01-04 ENCOUNTER — Other Ambulatory Visit: Payer: Self-pay

## 2019-01-05 ENCOUNTER — Encounter: Payer: Self-pay | Admitting: Nurse Practitioner

## 2019-01-05 ENCOUNTER — Non-Acute Institutional Stay: Payer: Medicaid Other | Admitting: Nurse Practitioner

## 2019-01-05 ENCOUNTER — Inpatient Hospital Stay: Payer: Medicaid Other

## 2019-01-05 ENCOUNTER — Other Ambulatory Visit: Payer: Self-pay

## 2019-01-05 VITALS — BP 112/78 | HR 86 | Temp 97.7°F | Resp 18 | Wt 148.7 lb

## 2019-01-05 VITALS — BP 115/77 | HR 89 | Temp 95.7°F | Resp 20

## 2019-01-05 DIAGNOSIS — Z7189 Other specified counseling: Secondary | ICD-10-CM

## 2019-01-05 DIAGNOSIS — Z515 Encounter for palliative care: Secondary | ICD-10-CM

## 2019-01-05 DIAGNOSIS — R630 Anorexia: Secondary | ICD-10-CM

## 2019-01-05 DIAGNOSIS — R531 Weakness: Secondary | ICD-10-CM

## 2019-01-05 DIAGNOSIS — C661 Malignant neoplasm of right ureter: Secondary | ICD-10-CM

## 2019-01-05 DIAGNOSIS — Z5112 Encounter for antineoplastic immunotherapy: Secondary | ICD-10-CM | POA: Diagnosis not present

## 2019-01-05 MED ORDER — SODIUM CHLORIDE 0.9 % IV SOLN
Freq: Once | INTRAVENOUS | Status: AC
  Administered 2019-01-05: 10:00:00 via INTRAVENOUS
  Filled 2019-01-05: qty 250

## 2019-01-05 MED ORDER — SODIUM CHLORIDE 0.9 % IV SOLN
200.0000 mg | Freq: Once | INTRAVENOUS | Status: AC
  Administered 2019-01-05: 200 mg via INTRAVENOUS
  Filled 2019-01-05: qty 8

## 2019-01-05 NOTE — Progress Notes (Signed)
Chad [name] Consult Note Telephone: [phone]  Fax: [phone]  PATIENT NAME: Chad [name] DOB: [birth year] MRN: [id number]  PRIMARY CARE PROVIDER:   [name], MD  REFERRING PROVIDER:  Dr [facility] RESPONSIBLE PARTY:  Self  RECOMMENDATIONS and PLAN:  1.Anorexia R63.0 secondary to protein calorie malnutritionrelated to cancerimprovingongoing.Continue to monitor daily weights, supplements, supportive measures and courage to eat  2.Generalized weaknessR53.1 secondary to multiple chronic disease progression including anemia, late onset CVA, urothelial and colon cancer.Encourage energy conservation and rest times.  3.Palliative care encounter Z51.5; Palliative medicine team will continue to support patient, patient's family, and medical team. Visit consisted of counseling and education dealing with the complex and emotionally intense issues of symptom management and palliative care in the setting of serious and potentially life-threatening illness  ASSESSMENT:     I visited and observe Chad [name]. We talked about purpose for follow up palliative care visit and he was in agreement. We talked about how he's feeling today. Chad [name] endorses that he's doing okay. He went and had his treatment and did okay with it. We talked about symptoms of pain which he shared is currently being managed. We talked about shortness of breath but she denies. We talked about his appetite which is improving. We talked about medical goals of care wishes which remain unchanged. We talked about smoking cessation which he declines. He shared he needs something to do. Who talked about social isolation with no visitors due to covid-19 pandemic. We talked about challenges residing in [place] with loss of [place] and relying on others for care. Chad. Advani endorses "it is how it has to be". We  talked about quality of life. Therapeutic listening and emotional support provided. No new changes at present time will continue to follow monitor with palliative care. I have updated nursing staff in the new changes to current goals or plan of care.  4 / 28 / 2020 albumin 3.9, total protein 7.7   I spent 45 minutes providing this consultation,  from 12:45pm to 1:30pm. More than 50% of the time in this consultation was spent coordinating communication.   HISTORY OF PRESENT ILLNESS:  Chad [name] is a 58 y.o. year old male with multiple medical problems including late onset CVA, urothelial cancer, prostate cancer, chronic hepatitis C, hyperlipidemia, colon surgery, protein calorie malnutrition, anxiety, depression. Chad. [name] continues to reside in [place] at [facility]. He does transfer to his wheelchair where he is mobile. He is able to perform most adl's and toilette himself. He feeds himself and appetite has been slightly better. No recent Falls, wounds, hospitalizations, infections. He is continuing to go to the cancer center for treatment and tolerating. He verbalizes his needs and advocates for himself. He does spend time on the smoking patio and decline smoking cessation. At present he is sitting in the wheelchair in his room. He denies any concerns or complaints. No visitors present. Palliative Care was asked to help to continue to address goals of care.   CODE STATUS: DNR PPS: 40% HOSPICE ELIGIBILITY/DIAGNOSIS: TBD  PAST MEDICAL HISTORY:  Past Medical History:  Diagnosis Date   Anemia    Anxiety    ARF (acute respiratory failure) ([facility])    Bladder cancer ([facility])    Cancer ([facility])    prostate   COPD (chronic obstructive pulmonary disease) ([place])    Depression    Dysphagia  GERD (gastroesophageal reflux disease)    Hepatitis    chronic hep c   Hyperlipidemia    Malignant neoplasm of colon ([facility])    Nerve pain     Stroke ([facility])     SOCIAL HX:  Social History   Tobacco Use   Smoking status: Current Every Day Smoker    Packs/day: 1.00    Types: Cigarettes   Smokeless tobacco: Never Used  Substance Use Topics   Alcohol use: Not Currently    Frequency: Never    ALLERGIES:  Allergies  Allergen Reactions   Penicillins Rash     PERTINENT MEDICATIONS:  Outpatient Encounter Medications as of 01/05/2019  Medication Sig   acetaminophen (TYLENOL) 325 MG tablet Take 650 mg by mouth 3 (three) times daily.    diclofenac sodium (VOLTAREN) 1 % GEL Apply 2 g topically See admin instructions. Apply 2 g topically 4 times daily. Apply 2 g to left shoulder, apply 2 g to left hip and 2 g to left knee 4 times daily per [facility] instructions   escitalopram (LEXAPRO) 20 MG tablet Take 1 tablet (20 mg total) by mouth daily.   gabapentin (NEURONTIN) 100 MG capsule Take one to 2 tabs for pain 3 x day (Patient taking differently: Take 100 mg by mouth 3 (three) times daily. )   mirtazapine (REMERON) 7.5 MG tablet Take 7.5 mg by mouth at bedtime.   oxyCODONE (OXY IR/ROXICODONE) 5 MG immediate release tablet Take 5 mg by mouth every 6 (six) hours.    pantoprazole (PROTONIX) 40 MG tablet Take 40 mg by mouth daily.   No facility-administered encounter medications on file as of 01/05/2019.     PHYSICAL EXAM:   General: NAD, chronically ill, pleasant thin male Cardiovascular: regular rate and rhythm Pulmonary: clear ant fields Abdomen: soft, nontender, + bowel sounds GU: no suprapubic tenderness Extremities: no edema, no joint deformities Skin: no rashes Neurological: Weakness but otherwise nonfocal  Chad [name] Ihor Gully, NP

## 2019-01-21 ENCOUNTER — Other Ambulatory Visit: Payer: Self-pay

## 2019-01-24 ENCOUNTER — Other Ambulatory Visit: Payer: Self-pay

## 2019-01-24 ENCOUNTER — Encounter: Payer: Self-pay | Admitting: Internal Medicine

## 2019-01-24 ENCOUNTER — Inpatient Hospital Stay: Payer: Medicaid Other

## 2019-01-24 ENCOUNTER — Inpatient Hospital Stay (HOSPITAL_BASED_OUTPATIENT_CLINIC_OR_DEPARTMENT_OTHER): Payer: Medicaid Other | Admitting: Internal Medicine

## 2019-01-24 DIAGNOSIS — K7460 Unspecified cirrhosis of liver: Secondary | ICD-10-CM

## 2019-01-24 DIAGNOSIS — C661 Malignant neoplasm of right ureter: Secondary | ICD-10-CM

## 2019-01-24 DIAGNOSIS — J449 Chronic obstructive pulmonary disease, unspecified: Secondary | ICD-10-CM

## 2019-01-24 DIAGNOSIS — R5383 Other fatigue: Secondary | ICD-10-CM | POA: Diagnosis not present

## 2019-01-24 DIAGNOSIS — C189 Malignant neoplasm of colon, unspecified: Secondary | ICD-10-CM | POA: Diagnosis not present

## 2019-01-24 DIAGNOSIS — B182 Chronic viral hepatitis C: Secondary | ICD-10-CM

## 2019-01-24 DIAGNOSIS — R5381 Other malaise: Secondary | ICD-10-CM

## 2019-01-24 DIAGNOSIS — Z5112 Encounter for antineoplastic immunotherapy: Secondary | ICD-10-CM | POA: Diagnosis not present

## 2019-01-24 DIAGNOSIS — K219 Gastro-esophageal reflux disease without esophagitis: Secondary | ICD-10-CM

## 2019-01-24 DIAGNOSIS — Z791 Long term (current) use of non-steroidal anti-inflammatories (NSAID): Secondary | ICD-10-CM

## 2019-01-24 DIAGNOSIS — F17210 Nicotine dependence, cigarettes, uncomplicated: Secondary | ICD-10-CM

## 2019-01-24 DIAGNOSIS — M549 Dorsalgia, unspecified: Secondary | ICD-10-CM

## 2019-01-24 DIAGNOSIS — L97919 Non-pressure chronic ulcer of unspecified part of right lower leg with unspecified severity: Secondary | ICD-10-CM

## 2019-01-24 DIAGNOSIS — E785 Hyperlipidemia, unspecified: Secondary | ICD-10-CM

## 2019-01-24 DIAGNOSIS — Z7189 Other specified counseling: Secondary | ICD-10-CM

## 2019-01-24 DIAGNOSIS — D509 Iron deficiency anemia, unspecified: Secondary | ICD-10-CM

## 2019-01-24 DIAGNOSIS — Z79899 Other long term (current) drug therapy: Secondary | ICD-10-CM

## 2019-01-24 DIAGNOSIS — G8929 Other chronic pain: Secondary | ICD-10-CM

## 2019-01-24 DIAGNOSIS — Z515 Encounter for palliative care: Secondary | ICD-10-CM

## 2019-01-24 DIAGNOSIS — R161 Splenomegaly, not elsewhere classified: Secondary | ICD-10-CM

## 2019-01-24 DIAGNOSIS — Z8546 Personal history of malignant neoplasm of prostate: Secondary | ICD-10-CM

## 2019-01-24 DIAGNOSIS — F419 Anxiety disorder, unspecified: Secondary | ICD-10-CM

## 2019-01-24 DIAGNOSIS — Z8673 Personal history of transient ischemic attack (TIA), and cerebral infarction without residual deficits: Secondary | ICD-10-CM

## 2019-01-24 DIAGNOSIS — L97929 Non-pressure chronic ulcer of unspecified part of left lower leg with unspecified severity: Secondary | ICD-10-CM

## 2019-01-24 DIAGNOSIS — G629 Polyneuropathy, unspecified: Secondary | ICD-10-CM

## 2019-01-24 DIAGNOSIS — Z85038 Personal history of other malignant neoplasm of large intestine: Secondary | ICD-10-CM

## 2019-01-24 LAB — COMPREHENSIVE METABOLIC PANEL
ALT: 9 U/L (ref 0–44)
AST: 19 U/L (ref 15–41)
Albumin: 3.9 g/dL (ref 3.5–5.0)
Alkaline Phosphatase: 87 U/L (ref 38–126)
Anion gap: 9 (ref 5–15)
BUN: 14 mg/dL (ref 6–20)
CO2: 24 mmol/L (ref 22–32)
Calcium: 9 mg/dL (ref 8.9–10.3)
Chloride: 105 mmol/L (ref 98–111)
Creatinine, Ser: 0.93 mg/dL (ref 0.61–1.24)
GFR calc Af Amer: >60 mL/min (ref >60)
GFR calc non Af Amer: >60 mL/min (ref >60)
Glucose, Bld: 101 mg/dL — ABNORMAL HIGH (ref 70–99)
Potassium: 4.2 mmol/L (ref 3.5–5.1)
Sodium: 138 mmol/L (ref 135–145)
Total Bilirubin: 0.6 mg/dL (ref 0.3–1.2)
Total Protein: 8.4 g/dL — ABNORMAL HIGH (ref 6.5–8.1)

## 2019-01-24 LAB — CBC WITH DIFFERENTIAL/PLATELET
Abs Immature Granulocytes: 0.01 10*3/uL (ref 0.00–0.07)
Basophils Absolute: 0.1 10*3/uL (ref 0.0–0.1)
Basophils Relative: 1 %
Eosinophils Absolute: 0.3 10*3/uL (ref 0.0–0.5)
Eosinophils Relative: 4 %
HCT: 37.1 % — ABNORMAL LOW (ref 39.0–52.0)
Hemoglobin: 12 g/dL — ABNORMAL LOW (ref 13.0–17.0)
Immature Granulocytes: 0 %
Lymphocytes Relative: 16 %
Lymphs Abs: 1.1 10*3/uL (ref 0.7–4.0)
MCH: 26 pg (ref 26.0–34.0)
MCHC: 32.3 g/dL (ref 30.0–36.0)
MCV: 80.5 fL (ref 80.0–100.0)
Monocytes Absolute: 0.5 10*3/uL (ref 0.1–1.0)
Monocytes Relative: 8 %
Neutro Abs: 4.9 10*3/uL (ref 1.7–7.7)
Neutrophils Relative %: 71 %
Platelets: 210 10*3/uL (ref 150–400)
RBC: 4.61 MIL/uL (ref 4.22–5.81)
RDW: 13.5 % (ref 11.5–15.5)
WBC: 7 10*3/uL (ref 4.0–10.5)
nRBC: 0 % (ref 0.0–0.2)

## 2019-01-24 LAB — TSH: TSH: 1.81 u[IU]/mL (ref 0.350–4.500)

## 2019-01-24 MED ORDER — SODIUM CHLORIDE 0.9 % IV SOLN
Freq: Once | INTRAVENOUS | Status: AC
  Administered 2019-01-24: 10:00:00 via INTRAVENOUS
  Filled 2019-01-24: qty 250

## 2019-01-24 MED ORDER — SODIUM CHLORIDE 0.9 % IV SOLN
200.0000 mg | Freq: Once | INTRAVENOUS | Status: AC
  Administered 2019-01-24: 200 mg via INTRAVENOUS
  Filled 2019-01-24: qty 8

## 2019-01-24 NOTE — Assessment & Plan Note (Addendum)
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 05/15-C/A/P-STABLE; right ureteral thickening/small pelvic/retroperitoneal lymph nodes.  # Proceed with cycle # 11 of keytruda today. Labs today reviewed;  acceptable for treatment today.  # Worsening fatigue- TSH slightly abnormal 3 weeks ago; will repeat today.   # Poor IV access- was able to draw blood today- if continues to be an issue would recommend mediport.   #Colon cancer.  Stage IIII- stable.   #Iron deficiency anemia-hemoglobin 12. stable   #Cirrhosis/ spleenomegaly-Hepatitis C-positive/HCV RNA positive.  Stable.   # Bilateral LE ulcers-s/p wound care evaluation; stable.   #DISPOSITION:  # [name] today; add TSH today. # follow up 3 weeks -MD labs-cbc/cmp/Thyroid profile-/MD/Keytruda; - Dr.B

## 2019-01-24 NOTE — Progress Notes (Signed)
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; # Colon ca- stage III  GOALS: palliative  CURRENT/MOST RECENT THERAPY: Keytruda    Urothelial cancer ([place])   Initial Diagnosis   Urothelial cancer ([place])   Ureteral cancer, right ([place])  05/26/2018 Initial Diagnosis   Ureteral cancer, right ([place])   06/21/2018 -  Chemotherapy   The patient had pembrolizumab (KEYTRUDA) 200 mg in sodium chloride 0.9 % 50 mL chemo infusion, 200 mg, Intravenous, Once, 11 of 13 cycles Administration: 200 mg (06/21/2018), 200 mg (07/12/2018), 200 mg (08/02/2018), 200 mg (08/23/2018), 200 mg (09/13/2018), 200 mg (10/04/2018), 200 mg (10/25/2018), 200 mg (11/22/2018), 200 mg (12/13/2018), 200 mg (01/05/2019), 200 mg (01/24/2019)  for chemotherapy treatment.       HISTORY OF PRESENTING ILLNESS: Patient is a poor historian.  Is alone.  Chad [name] 58 y.o.  male above history of stage IV-ureteral cancer/with multiple co-morbidities currently on Keytruda is here for follow-up.  Patient states that she has not slept well last night-is sleepy this morning.  He cannot explain why his neuropathy happened last night.  He continues to have bilateral lower extremity ulcers/for wound care at the nursing home.  Denies any diarrhea.  Denies any blood in stools or black stools.  Complains of mild to moderate fatigue.  Chronic back pain.  Review of Systems  Constitutional: Positive for  malaise/fatigue. Negative for chills, diaphoresis, fever and weight loss.  HENT: Negative for nosebleeds and sore throat.   Eyes: Negative for double vision.  Respiratory: Positive for cough and shortness of breath. Negative for hemoptysis and wheezing.   Cardiovascular: Positive for leg swelling. Negative for chest pain, palpitations and orthopnea.  Gastrointestinal: Negative for abdominal pain, blood in stool, constipation, diarrhea, heartburn, melena, nausea and vomiting.  Genitourinary: Negative for dysuria, frequency and urgency.  Musculoskeletal: Positive for back pain and joint pain.  Skin: Negative.  Negative for itching and rash.  Neurological: Positive for focal weakness. Negative for dizziness, tingling, weakness and headaches.       Chronic left-sided weakness upper than lower extremity.  Endo/Heme/Allergies: Does not bruise/bleed easily.  Psychiatric/Behavioral: Negative for depression. The patient is not nervous/anxious and does not have insomnia.      MEDICAL HISTORY:  Past Medical History:  Diagnosis Date  . Anemia   . Anxiety   . ARF (acute respiratory failure) ([place])   . Bladder cancer ([place])   . Cancer [facility])    prostate  . COPD (chronic obstructive pulmonary disease) ([place])   . Depression   . Dysphagia   . GERD (gastroesophageal reflux disease)   . Hepatitis    chronic hep c  . Hyperlipidemia   . Malignant neoplasm of colon ([place])   . Nerve pain   . Stroke [facility])     SURGICAL HISTORY: Past Surgical History:  Procedure Laterality Date  . COLON SURGERY     En bloc extended right hemicolectomy 07/2017  . CYSTOSCOPY W/ RETROGRADES Right 08/30/2018  Procedure: CYSTOSCOPY WITH RETROGRADE PYELOGRAM;  Surgeon: Hollice Espy, MD;  Location: [facility] ORS;  Service: Urology;  Laterality: Right;  . CYSTOSCOPY WITH STENT PLACEMENT Right 04/25/2018   Procedure: CYSTOSCOPY WITH STENT PLACEMENT;  Surgeon: Hollice Espy, MD;  Location: [facility] ORS;  Service: Urology;  Laterality:  Right;  . CYSTOSCOPY WITH STENT PLACEMENT Right 08/30/2018   Procedure: [place] WITH STENT Exchange;  Surgeon: Hollice Espy, MD;  Location: [facility] ORS;  Service: Urology;  Laterality: Right;  . tumor removed       SOCIAL HISTORY: Social History   Socioeconomic History  . Marital status: Single    Spouse name: Not on file  . Number of children: Not on file  . Years of education: Not on file  . Highest education level: Not on file  Occupational History  . Not on file  Social Needs  . Financial resource strain: Not on file  . Food insecurity    Worry: Not on file    Inability: Not on file  . Transportation needs    Medical: Not on file    Non-medical: Not on file  Tobacco Use  . Smoking status: Current Every Day Smoker    Packs/day: 1.00    Types: Cigarettes  . Smokeless tobacco: Never Used  Substance and Sexual Activity  . Alcohol use: Not Currently    Frequency: Never  . Drug use: Not Currently  . Sexual activity: Not on file  Lifestyle  . Physical activity    Days per week: Not on file    Minutes per session: Not on file  . Stress: Not on file  Relationships  . Social Herbalist on phone: Not on file    Gets together: Not on file    Attends religious service: Not on file    Active member of club or organization: Not on file    Attends meetings of clubs or organizations: Not on file    Relationship status: Not on file  . Intimate partner violence    Fear of current or ex partner: Not on file    Emotionally abused: Not on file    Physically abused: Not on file    Forced sexual activity: Not on file  Other Topics Concern  . Not on file  Social History Narrative    used to live [place]; moved  To [place]- end of April 2019; in Nursing home; 1pp/day; quit alcohol. 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        Family- dad- prostate ca [at 76y]; brother- 79 died of prostate cancer; brother- 54- no cancers [[place]]; sonGerald [name] [[place]];[name]-32y prostate ca [facility]  [place] daughter- 43 [[place]]; another daughter 68 [[place]/addict]. will refer genetics counseling. Given MSI- abnormal; highly suspicious of Lynch syndrome.  Patient's son [name] aware of high possible lynch syndrome.    FAMILY HISTORY: Family History  Problem Relation Age of Onset  . Prostate cancer Father     ALLERGIES:  is allergic to penicillins.  MEDICATIONS:  Current Outpatient Medications  Medication Sig Dispense Refill  . acetaminophen (TYLENOL) 325 MG tablet Take 650 mg by mouth 3 (three) times daily.     . cyclobenzaprine (FLEXERIL) 10 MG tablet Take 10 mg by mouth at bedtime.    . diclofenac sodium (VOLTAREN) 1 % GEL Apply 2 g topically See admin instructions. Apply 2 g topically 4 times daily. Apply 2 g to left shoulder, apply 2 g to left hip and 2 g to left knee  4 times daily per [facility] instructions    . escitalopram (LEXAPRO) 20 MG tablet Take 1 tablet (20 mg total) by mouth daily. 90 tablet 3  . gabapentin (NEURONTIN) 100 MG capsule Take one to 2 tabs for pain 3 x day (Patient taking differently: Take 100 mg by mouth 3 (three) times daily. ) 180 capsule 3  . mirtazapine (REMERON) 7.5 MG tablet Take 7.5 mg by mouth at bedtime.    Marland Kitchen oxyCODONE (OXY IR/ROXICODONE) 5 MG immediate release tablet Take 5 mg by mouth every 6 (six) hours.     . pantoprazole (PROTONIX) 40 MG tablet Take 40 mg by mouth daily.     No current facility-administered medications for this visit.       Marland Kitchen  PHYSICAL EXAMINATION: ECOG PERFORMANCE STATUS: 1 - Symptomatic but completely ambulatory  Vitals:   01/24/19 0900  BP: 120/79  Pulse: 84  Resp: 18  Temp: (!) 97 F (36.1 C)   Filed Weights   01/24/19 0900  Weight: 150 lb (68 kg)    Physical Exam  Constitutional: He is oriented to person, place, and time.  In wheelchair. Cachectic.  Appears older than his stated age.  Alone.  HENT:  Head: Normocephalic and atraumatic.  Mouth/Throat: Oropharynx is clear and moist. No oropharyngeal exudate.   Eyes: Pupils are equal, round, and reactive to light.  Neck: Normal range of motion. Neck supple.  Cardiovascular: Normal rate and regular rhythm.  Pulmonary/Chest: No respiratory distress. He has no wheezes.  Abdominal: Soft. Bowel sounds are normal. He exhibits no distension and no mass. There is no abdominal tenderness. There is no rebound and no guarding.  Musculoskeletal: Normal range of motion.        General: No tenderness or edema.  Neurological: He is oriented to person, place, and time.  Sleepy but easily arousable.  Chronic weakness left upper and lower extremity.  Skin: Skin is warm.  Bilateral lower extremity ulcerations noted.  Pulses intact bilaterally.  Psychiatric: Affect normal.     LABORATORY DATA:  I have reviewed the data as listed Lab Results  Component Value Date   WBC 7.0 01/24/2019   HGB 12.0 (L) 01/24/2019   HCT 37.1 (L) 01/24/2019   MCV 80.5 01/24/2019   PLT 210 01/24/2019   Recent Labs    12/13/18 1030 01/03/19 0903 01/24/19 0815  NA 136 135 138  K 4.1 4.0 4.2  CL 106 103 105  CO2 '23 23 24  ' GLUCOSE 93 88 101*  BUN 21* 22* 14  CREATININE 0.94 0.96 0.93  CALCIUM 8.8* 9.0 9.0  GFRNONAA >60 >60 >60  GFRAA >60 >60 >60  PROT 8.1 8.5* 8.4*  ALBUMIN 3.8 4.0 3.9  AST '17 17 19  ' ALT '8 8 9  ' ALKPHOS 87 92 87  BILITOT 0.6 0.9 0.6    RADIOGRAPHIC STUDIES: I have personally reviewed the radiological images as listed and agreed with the findings in the report. No results found.  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 05/15-C/A/P-STABLE; right ureteral thickening/small pelvic/retroperitoneal lymph nodes.  # Proceed with cycle # 11 of keytruda today. Labs today reviewed;  acceptable for treatment today.  # Worsening fatigue- TSH slightly abnormal 3 weeks ago; will repeat today.   # Poor IV access- was able to draw blood today- if continues to be  an issue would recommend mediport.   #Colon cancer.  Stage IIII- stable.   #  Iron deficiency anemia-hemoglobin 12. stable   #Cirrhosis/ spleenomegaly-Hepatitis C-positive/HCV RNA positive.  Stable.   # Bilateral LE ulcers-s/p wound care evaluation; stable.   #DISPOSITION:  # [name] today; add TSH today. # follow up 3 weeks -MD labs-cbc/cmp/Thyroid profile-/MD/Keytruda; - Dr.B  All questions were answered. The patient knows to call the clinic with any problems, questions or concerns.    Cammie Sickle, MD 01/24/2019 11:30 AM

## 2019-02-04 ENCOUNTER — Other Ambulatory Visit: Payer: Self-pay

## 2019-02-04 ENCOUNTER — Non-Acute Institutional Stay: Payer: Medicaid Other | Admitting: Nurse Practitioner

## 2019-02-04 ENCOUNTER — Encounter: Payer: Self-pay | Admitting: Nurse Practitioner

## 2019-02-04 VITALS — BP 128/74 | HR 92 | Temp 97.7°F | Resp 18 | Ht 67.0 in | Wt 148.7 lb

## 2019-02-04 DIAGNOSIS — R630 Anorexia: Secondary | ICD-10-CM

## 2019-02-04 DIAGNOSIS — Z515 Encounter for palliative care: Secondary | ICD-10-CM

## 2019-02-04 DIAGNOSIS — G894 Chronic pain syndrome: Secondary | ICD-10-CM

## 2019-02-04 DIAGNOSIS — R531 Weakness: Secondary | ICD-10-CM

## 2019-02-04 NOTE — Progress Notes (Signed)
[facility] Consult Note Telephone: [phone]  Fax: [phone]  PATIENT NAME: Chad [name] DOB: June 16, 1961 MRN: [id number]  PRIMARY CARE PROVIDER:   [name], MD  REFERRING PROVIDER:  Dr Hodges/[facility] RESPONSIBLE PARTY:     Self  RECOMMENDATIONS and PLAN: 1.Palliative care encounter Z51.5; Palliative medicine team will continue to support patient, patient's family, and medical team. Visit consisted of counseling and education dealing with the complex and emotionally intense issues of symptom management and palliative care in the setting of serious and potentially life-threatening illness  2.Anorexia R63.0 secondary to protein calorie malnutritionrelated to cancerimprovingongoing.Continue to monitor daily weights, supplements, supportive measures and courage to eat  3.Generalized weaknessR53.1 secondary to multiple chronic disease progression including anemia, late onset CVA, urothelial and colon cancer.Encourage energy conservation and rest times.  4. Chronic pain G89.29 monitor on pain scale, monitor efficacy vs adverse side effects. Currently taking scheduled gabapentin; tylenol in addition to oxycodone 10mg  q6hrs/not at maximum usage; will continue to monitor; encouraged to ask when in pain, may consider MsContin with worsening wounds if not managed on maximized dose of Oxycodone; Continue to follow by psychotherapy and psychiatry for counseling; supportive services.   ASSESSMENT:   BMI 15.7 3 / 11 / 2020 weight 104.4 lbs 5 / 19 / 2020 weight 105.5 lbs 6 / 10 / 2020 weight 106.2 lbs  I visited and observed Chad [name]. We talked about purpose of palliative care visit and he nodded his head. Limited verbal discussion due to cognitive impairment. He did talk about wanting to get his cigarettes. He talked about wanting to go out to smoke. He was sitting with Oreo cookies in his lap feeding himself.  We talked about symptoms of pain which he replied no. He was cooperative with assessment. Emotional support provided. He ruminated about the cigarettes and going to the smoking patio. At present time will continue to follow, monitor with palliative care as hospice was previously suggested their daughter declined. He is under DSS Guardianship and will contact for update of palliative care visit and the new changes to current goals are plan of care. Will follow up in six weeks if needed or sooner should he declined. I updated nursing staff new new changes to current goals or plan of care.  I spent 45 minutes providing this consultation,  from 1:15pm  to 2:00pm. More than 50% of the time in this consultation was spent coordinating communication.   HISTORY OF PRESENT ILLNESS:  Chad [name] is a 58 y.o. year old male with multiple medical problems including lateonset CVA, urothelial cancer, prostate cancer, chronic hepatitis C, hyperlipidemia, colon surgery, protein calorie malnutrition, anxiety, depression. Chad [name] continues to reside at [place] at [name]. He is a lift to the wheelchair. He is Mobile in the facility by other residents pushing him. He is non-ambulatory. He does require assistance for adl's, incontinent bowel and bladder. He does feed himself after tray setup and it does take him some effort to eat. Staff endorses he has been intermittently refusing medications. He does continue to get tramadol 50 mg Q 12 hours as needed for pain although he also refuses at times. No recent Falls, infections, hospitalizations. He does continue to be followed by psychiatrists last day to serve is 5 / 1 / 2020 for regulatory visit for regulatory visit no medication changes. Primary provider last visit 6 / 11 / 2020 for COPD and nicotine dependency  which he does continue to smoke and refuses to quit. Most form is complete with DNR / do not intubate / do not  hospitalize / wishes are for antibiotic therapy, IV fluids, diagnostic testing, lab testing. At present he's sitting in the wheelchair in the hall. He denies any concerns are complaints. Palliative Care was asked to help to continue to address goals of care.   CODE STATUS: DNR  PPS: 40% HOSPICE ELIGIBILITY/DIAGNOSIS: TBD  PAST MEDICAL HISTORY:  Past Medical History:  Diagnosis Date   Anemia    Anxiety    ARF (acute respiratory failure) ([facility])    Bladder cancer ([facility])    Cancer ([facility])    prostate   COPD (chronic obstructive pulmonary disease) ([facility])    Depression    Dysphagia    GERD (gastroesophageal reflux disease)    Hepatitis    chronic hep c   Hyperlipidemia    Malignant neoplasm of colon ([facility])    Nerve pain    Stroke ([facility])     SOCIAL HX:  Social History   Tobacco Use   Smoking status: Current Every Day Smoker    Packs/day: 1.00    Types: Cigarettes   Smokeless tobacco: Never Used  Substance Use Topics   Alcohol use: Not Currently    Frequency: Never    ALLERGIES:  Allergies  Allergen Reactions   Penicillins Rash     PERTINENT MEDICATIONS:  Outpatient Encounter Medications as of 02/04/2019  Medication Sig   acetaminophen (TYLENOL) 325 MG tablet Take 650 mg by mouth 3 (three) times daily.   cyclobenzaprine (FLEXERIL) 10 MG tablet Take 10 mg by mouth at bedtime.   diclofenac sodium (VOLTAREN) 1 % GEL Apply 2 g topically See admin instructions. Apply 2 g topically 4 times daily. Apply 2 g to left shoulder, apply 2 g to left hip and 2 g to left knee 4 times daily per [facility] instructions   gabapentin (NEURONTIN) 100 MG capsule Take 100 mg by mouth 3 (three) times daily.   mirtazapine (REMERON) 7.5 MG tablet Take 7.5 mg by mouth at bedtime.   oxycodone (OXY-IR) 5 MG capsule Take 10 mg by mouth every 6 (six) hours as needed.   pantoprazole (PROTONIX) 40 MG tablet Take 40 mg by mouth daily.   [DISCONTINUED] acetaminophen (TYLENOL) 325 MG tablet  Take 650 mg by mouth 3 (three) times daily.    [DISCONTINUED] escitalopram (LEXAPRO) 20 MG tablet Take 1 tablet (20 mg total) by mouth daily. (Patient not taking: Reported on 02/04/2019)   [DISCONTINUED] gabapentin (NEURONTIN) 100 MG capsule Take one to 2 tabs for pain 3 x day (Patient not taking: Reported on 02/04/2019)   [DISCONTINUED] oxyCODONE (OXY IR/ROXICODONE) 5 MG immediate release tablet Take 5 mg by mouth every 6 (six) hours.    No facility-administered encounter medications on file as of 02/04/2019.     PHYSICAL EXAM:   General: NAD, frail appearing, thin pleasant male Cardiovascular: regular rate and rhythm Pulmonary: clear ant fields Abdomen: soft, nontender, + bowel sounds GU: no suprapubic tenderness Extremities: no edema, no joint deformities Skin: no rashes Neurological: Weakness but otherwise nonfocal  Haadi Santellan Ihor Gully, NP

## 2019-02-14 ENCOUNTER — Inpatient Hospital Stay: Payer: Medicaid Other | Attending: Internal Medicine

## 2019-02-14 ENCOUNTER — Inpatient Hospital Stay: Payer: Medicaid Other

## 2019-02-14 ENCOUNTER — Inpatient Hospital Stay (HOSPITAL_BASED_OUTPATIENT_CLINIC_OR_DEPARTMENT_OTHER): Payer: Medicaid Other | Admitting: Internal Medicine

## 2019-02-14 ENCOUNTER — Other Ambulatory Visit: Payer: Self-pay

## 2019-02-14 DIAGNOSIS — Z791 Long term (current) use of non-steroidal anti-inflammatories (NSAID): Secondary | ICD-10-CM

## 2019-02-14 DIAGNOSIS — K219 Gastro-esophageal reflux disease without esophagitis: Secondary | ICD-10-CM | POA: Diagnosis not present

## 2019-02-14 DIAGNOSIS — R5382 Chronic fatigue, unspecified: Secondary | ICD-10-CM

## 2019-02-14 DIAGNOSIS — Z5112 Encounter for antineoplastic immunotherapy: Secondary | ICD-10-CM | POA: Insufficient documentation

## 2019-02-14 DIAGNOSIS — Z79899 Other long term (current) drug therapy: Secondary | ICD-10-CM

## 2019-02-14 DIAGNOSIS — C189 Malignant neoplasm of colon, unspecified: Secondary | ICD-10-CM

## 2019-02-14 DIAGNOSIS — D509 Iron deficiency anemia, unspecified: Secondary | ICD-10-CM | POA: Diagnosis not present

## 2019-02-14 DIAGNOSIS — L98499 Non-pressure chronic ulcer of skin of other sites with unspecified severity: Secondary | ICD-10-CM | POA: Insufficient documentation

## 2019-02-14 DIAGNOSIS — B1920 Unspecified viral hepatitis C without hepatic coma: Secondary | ICD-10-CM | POA: Insufficient documentation

## 2019-02-14 DIAGNOSIS — Z9221 Personal history of antineoplastic chemotherapy: Secondary | ICD-10-CM

## 2019-02-14 DIAGNOSIS — Z515 Encounter for palliative care: Secondary | ICD-10-CM

## 2019-02-14 DIAGNOSIS — Z8673 Personal history of transient ischemic attack (TIA), and cerebral infarction without residual deficits: Secondary | ICD-10-CM | POA: Insufficient documentation

## 2019-02-14 DIAGNOSIS — F17210 Nicotine dependence, cigarettes, uncomplicated: Secondary | ICD-10-CM

## 2019-02-14 DIAGNOSIS — J449 Chronic obstructive pulmonary disease, unspecified: Secondary | ICD-10-CM | POA: Diagnosis not present

## 2019-02-14 DIAGNOSIS — R5381 Other malaise: Secondary | ICD-10-CM | POA: Insufficient documentation

## 2019-02-14 DIAGNOSIS — E785 Hyperlipidemia, unspecified: Secondary | ICD-10-CM | POA: Diagnosis not present

## 2019-02-14 DIAGNOSIS — C661 Malignant neoplasm of right ureter: Secondary | ICD-10-CM

## 2019-02-14 LAB — CBC WITH DIFFERENTIAL/PLATELET
Abs Immature Granulocytes: 0.02 10*3/uL (ref 0.00–0.07)
Basophils Absolute: 0.1 10*3/uL (ref 0.0–0.1)
Basophils Relative: 1 %
Eosinophils Absolute: 0.4 10*3/uL (ref 0.0–0.5)
Eosinophils Relative: 7 %
HCT: 39.9 % (ref 39.0–52.0)
Hemoglobin: 12.7 g/dL — ABNORMAL LOW (ref 13.0–17.0)
Immature Granulocytes: 0 %
Lymphocytes Relative: 19 %
Lymphs Abs: 1.2 10*3/uL (ref 0.7–4.0)
MCH: 25.8 pg — ABNORMAL LOW (ref 26.0–34.0)
MCHC: 31.8 g/dL (ref 30.0–36.0)
MCV: 81.1 fL (ref 80.0–100.0)
Monocytes Absolute: 0.5 10*3/uL (ref 0.1–1.0)
Monocytes Relative: 9 %
Neutro Abs: 3.9 10*3/uL (ref 1.7–7.7)
Neutrophils Relative %: 64 %
Platelets: 192 10*3/uL (ref 150–400)
RBC: 4.92 MIL/uL (ref 4.22–5.81)
RDW: 14.3 % (ref 11.5–15.5)
WBC: 6.2 10*3/uL (ref 4.0–10.5)
nRBC: 0 % (ref 0.0–0.2)

## 2019-02-14 LAB — COMPREHENSIVE METABOLIC PANEL
ALT: 23 U/L (ref 0–44)
AST: 30 U/L (ref 15–41)
Albumin: 3.6 g/dL (ref 3.5–5.0)
Alkaline Phosphatase: 89 U/L (ref 38–126)
Anion gap: 8 (ref 5–15)
BUN: 20 mg/dL (ref 6–20)
CO2: 26 mmol/L (ref 22–32)
Calcium: 9 mg/dL (ref 8.9–10.3)
Chloride: 103 mmol/L (ref 98–111)
Creatinine, Ser: 0.92 mg/dL (ref 0.61–1.24)
GFR calc Af Amer: >60 mL/min (ref >60)
GFR calc non Af Amer: >60 mL/min (ref >60)
Glucose, Bld: 101 mg/dL — ABNORMAL HIGH (ref 70–99)
Potassium: 4 mmol/L (ref 3.5–5.1)
Sodium: 137 mmol/L (ref 135–145)
Total Bilirubin: 0.5 mg/dL (ref 0.3–1.2)
Total Protein: 8.1 g/dL (ref 6.5–8.1)

## 2019-02-14 MED ORDER — SODIUM CHLORIDE 0.9 % IV SOLN
Freq: Once | INTRAVENOUS | Status: AC
  Administered 2019-02-14: 10:00:00 via INTRAVENOUS
  Filled 2019-02-14: qty 250

## 2019-02-14 MED ORDER — SODIUM CHLORIDE 0.9 % IV SOLN
200.0000 mg | Freq: Once | INTRAVENOUS | Status: AC
  Administered 2019-02-14: 200 mg via INTRAVENOUS
  Filled 2019-02-14: qty 8

## 2019-02-14 NOTE — Progress Notes (Signed)
Patient does not offer any problems today.  

## 2019-02-14 NOTE — Progress Notes (Signed)
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; # Colon ca- stage III  GOALS: palliative  CURRENT/MOST RECENT THERAPY: Keytruda    Urothelial cancer ([place])   Initial Diagnosis   Urothelial cancer ([place])   Ureteral cancer, right ([place])  05/26/2018 Initial Diagnosis   Ureteral cancer, right ([place])   06/21/2018 -  Chemotherapy   The patient had pembrolizumab (KEYTRUDA) 200 mg in sodium chloride 0.9 % 50 mL chemo infusion, 200 mg, Intravenous, Once, 12 of 14 cycles Administration: 200 mg (06/21/2018), 200 mg (07/12/2018), 200 mg (08/02/2018), 200 mg (08/23/2018), 200 mg (09/13/2018), 200 mg (10/04/2018), 200 mg (10/25/2018), 200 mg (11/22/2018), 200 mg (12/13/2018), 200 mg (01/05/2019), 200 mg (01/24/2019), 200 mg (02/14/2019)  for chemotherapy treatment.       HISTORY OF PRESENTING ILLNESS: Patient is a poor historian.  Is alone.  Chad [name] 58 y.o.  male above history of stage IV-ureteral cancer/with multiple co-morbidities currently on Keytruda is here for follow-up.  Patient continues to have bilateral lower extremities/evaluate by wound care.  As per the patient is awaiting to see a vascular doctor for his lower extremities ulcerations.  Otherwise continues to have mild to moderate fatigue.  No diarrhea.  No unusual shortness of breath or cough.  No blood in urine.   Review of Systems  Constitutional: Positive for malaise/fatigue. Negative for  chills, diaphoresis, fever and weight loss.  HENT: Negative for nosebleeds and sore throat.   Eyes: Negative for double vision.  Respiratory: Positive for cough and shortness of breath. Negative for hemoptysis and wheezing.   Cardiovascular: Negative for chest pain, palpitations and orthopnea.  Gastrointestinal: Negative for abdominal pain, blood in stool, constipation, diarrhea, heartburn, melena, nausea and vomiting.  Genitourinary: Negative for dysuria, frequency and urgency.  Musculoskeletal: Positive for back pain and joint pain.  Skin: Negative.  Negative for itching and rash.  Neurological: Positive for focal weakness. Negative for dizziness, tingling, weakness and headaches.       Chronic left-sided weakness upper than lower extremity.  Endo/Heme/Allergies: Does not bruise/bleed easily.  Psychiatric/Behavioral: Negative for depression. The patient is not nervous/anxious and does not have insomnia.      MEDICAL HISTORY:  Past Medical History:  Diagnosis Date  . Anemia   . Anxiety   . ARF (acute respiratory failure) ([place])   . Bladder cancer ([place])   . Cancer [facility])    prostate  . COPD (chronic obstructive pulmonary disease) ([place])   . Depression   . Dysphagia   . GERD (gastroesophageal reflux disease)   . Hepatitis    chronic hep c  . Hyperlipidemia   . Malignant neoplasm of colon ([place])   . Nerve pain   . Stroke [facility])     SURGICAL HISTORY: Past Surgical History:  Procedure Laterality Date  . COLON SURGERY     En bloc extended right hemicolectomy 07/2017  . CYSTOSCOPY W/ RETROGRADES Right 08/30/2018   Procedure: CYSTOSCOPY WITH RETROGRADE PYELOGRAM;  Surgeon: Erlene Quan,  Ashley, MD;  Location: [facility] ORS;  Service: Urology;  Laterality: Right;  . CYSTOSCOPY WITH STENT PLACEMENT Right 04/25/2018   Procedure: CYSTOSCOPY WITH STENT PLACEMENT;  Surgeon: Brandon, Ashley, MD;  Location: [facility] ORS;  Service: Urology;  Laterality: Right;  . CYSTOSCOPY WITH STENT PLACEMENT Right 08/30/2018    Procedure: CYSTOSCOPY WITH STENT Exchange;  Surgeon: Brandon, Ashley, MD;  Location: [facility] ORS;  Service: Urology;  Laterality: Right;  . tumor removed       SOCIAL HISTORY: Social History   Socioeconomic History  . Marital status: Single    Spouse name: Not on file  . Number of children: Not on file  . Years of education: Not on file  . Highest education level: Not on file  Occupational History  . Not on file  Social Needs  . Financial resource strain: Not on file  . Food insecurity    Worry: Not on file    Inability: Not on file  . Transportation needs    Medical: Not on file    Non-medical: Not on file  Tobacco Use  . Smoking status: Current Every Day Smoker    Packs/day: 1.00    Types: Cigarettes  . Smokeless tobacco: Never Used  Substance and Sexual Activity  . Alcohol use: Not Currently    Frequency: Never  . Drug use: Not Currently  . Sexual activity: Not on file  Lifestyle  . Physical activity    Days per week: Not on file    Minutes per session: Not on file  . Stress: Not on file  Relationships  . Social connections    Talks on phone: Not on file    Gets together: Not on file    Attends religious service: Not on file    Active member of club or organization: Not on file    Attends meetings of clubs or organizations: Not on file    Relationship status: Not on file  . Intimate partner violence    Fear of current or ex partner: Not on file    Emotionally abused: Not on file    Physically abused: Not on file    Forced sexual activity: Not on file  Other Topics Concern  . Not on file  Social History Narrative    used to live [place]; moved  To [place]- end of April 2019; in Nursing home; 1pp/day; quit alcohol. 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        Family- dad- prostate ca [at 78y]; brother- 47 died of prostate cancer; brother- 54- no cancers [[place]]; son- Chad [name] [[place]];Chad [name]-32y prostate ca [[place]]; daughter- 38 [[place]]; another daughter 36 [[place]/addict]. will  refer genetics counseling. Given MSI- abnormal; highly suspicious of Lynch syndrome.  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    FAMILY HISTORY: Family History  Problem Relation Age of Onset  . Prostate cancer Father     ALLERGIES:  is allergic to penicillins.  MEDICATIONS:  Current Outpatient Medications  Medication Sig Dispense Refill  . acetaminophen (TYLENOL) 325 MG tablet Take 650 mg by mouth 3 (three) times daily.    . cyclobenzaprine (FLEXERIL) 10 MG tablet Take 10 mg by mouth at bedtime.    . diclofenac sodium (VOLTAREN) 1 % GEL Apply 2 g topically See admin instructions. Apply 2 g topically 4 times daily. Apply 2 g to left shoulder, apply 2 g to left hip and 2 g to left knee 4 times daily per MAR instructions    .   gabapentin (NEURONTIN) 100 MG capsule Take 100 mg by mouth 3 (three) times daily.    . mirtazapine (REMERON) 7.5 MG tablet Take 7.5 mg by mouth at bedtime.    . oxycodone (OXY-IR) 5 MG capsule Take 10 mg by mouth every 6 (six) hours as needed.    . pantoprazole (PROTONIX) 40 MG tablet Take 40 mg by mouth daily.     No current facility-administered medications for this visit.       .  PHYSICAL EXAMINATION: ECOG PERFORMANCE STATUS: 1 - Symptomatic but completely ambulatory  Vitals:   02/14/19 0907  BP: 112/73  Pulse: 73  Resp: 16  Temp: (!) 95.8 F (35.4 C)   Filed Weights   02/14/19 0907  Weight: 151 lb (68.5 kg)    Physical Exam  Constitutional: He is oriented to person, place, and time.  In wheelchair. Cachectic.  Appears older than his stated age.  Alone.  HENT:  Head: Normocephalic and atraumatic.  Mouth/Throat: Oropharynx is clear and moist. No oropharyngeal exudate.  Eyes: Pupils are equal, round, and reactive to light.  Neck: Normal range of motion. Neck supple.  Cardiovascular: Normal rate and regular rhythm.  Pulmonary/Chest: No respiratory distress. He has no wheezes.  Abdominal: Soft. Bowel sounds are normal.  He exhibits no distension and no mass. There is no abdominal tenderness. There is no rebound and no guarding.  Musculoskeletal: Normal range of motion.        General: No tenderness or edema.  Neurological: He is oriented to person, place, and time.  Sleepy but easily arousable.  Chronic weakness left upper and lower extremity.  Skin: Skin is warm.  Bilateral lower extremity ulcerations noted.  Pulses intact bilaterally.  Psychiatric: Affect normal.     LABORATORY DATA:  I have reviewed the data as listed Lab Results  Component Value Date   WBC 6.2 02/14/2019   HGB 12.7 (L) 02/14/2019   HCT 39.9 02/14/2019   MCV 81.1 02/14/2019   PLT 192 02/14/2019   Recent Labs    01/03/19 0903 01/24/19 0815 02/14/19 0833  NA 135 138 137  K 4.0 4.2 4.0  CL 103 105 103  CO2 23 24 26  GLUCOSE 88 101* 101*  BUN 22* 14 20  CREATININE 0.96 0.93 0.92  CALCIUM 9.0 9.0 9.0  GFRNONAA >60 >60 >60  GFRAA >60 >60 >60  PROT 8.5* 8.4* 8.1  ALBUMIN 4.0 3.9 3.6  AST 17 19 30  ALT 8 9 23  ALKPHOS 92 87 89  BILITOT 0.9 0.6 0.5    RADIOGRAPHIC STUDIES: I have personally reviewed the radiological images as listed and agreed with the findings in the report. No results found.  ASSESSMENT & PLAN:   Ureteral cancer, right (HCC) # High-grade urothelial cancer/cytology; likely of the right renal pelvis /upper ureter. 05/15-C/A/P- stable; slight ureteral thickening/small pelvic/retroperitoneal lymph nodes.  Stable.  # Proceed with cycle # 12 of keytruda today. Labs today reviewed;  acceptable for treatment today.  #Chronic mild fatigue-multifactorial stable.  #Colon cancer.  Stage IIII-stable.    #Iron deficiency anemia-hemoglobin 12.7-STABLE>   # Bilateral LE ulcers-s/p wound care evaluation; stable. Defer to PCP/ re: referral to vascular surgery.   #DISPOSITION:  # keytruda today;  # referral for port placement # follow up 3 weeks -MD labs-cbc/cmp/Keytruda; - Dr.B  All questions were  answered. The patient knows to call the clinic with any problems, questions or concerns.    Govinda R Brahmanday, MD 02/15/2019 8:19 AM    

## 2019-02-14 NOTE — Assessment & Plan Note (Addendum)
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 05/15-C/A/P- stable; slight ureteral thickening/small pelvic/retroperitoneal lymph nodes.  Stable.  # Proceed with cycle # 12 of keytruda today. Labs today reviewed;  acceptable for treatment today.  #Chronic mild fatigue-multifactorial stable.  #Colon cancer.  Stage IIII-stable.    #Iron deficiency anemia-hemoglobin 12.7-STABLE>   # Bilateral LE ulcers-s/p wound care evaluation; stable. Defer to PCP/ re: referral to vascular surgery.   #IV access issues-discussed pros and cons of Mediport; patient interested.  We will make a referral to vascular.  #DISPOSITION:  [name] today;  # referral for port placement # follow up 3 weeks -MD labs-cbc/cmp/Keytruda; - Dr.B

## 2019-02-15 ENCOUNTER — Telehealth (INDEPENDENT_AMBULATORY_CARE_PROVIDER_SITE_OTHER): Payer: Self-pay

## 2019-02-15 ENCOUNTER — Other Ambulatory Visit (INDEPENDENT_AMBULATORY_CARE_PROVIDER_SITE_OTHER): Payer: Self-pay

## 2019-02-15 LAB — THYROID PANEL WITH TSH
Free Thyroxine Index: 1.9 (ref 1.2–4.9)
T3 Uptake Ratio: 28 % (ref 24–39)
T4, Total: 6.8 ug/dL (ref 4.5–12.0)
TSH: 3.9 u[IU]/mL (ref 0.450–4.500)

## 2019-02-15 NOTE — Telephone Encounter (Signed)
Spoke with Aniceto Boss at [facility] care and the patient is now scheduled for an angio with Dr. Lucky Cowboy on 02/17/2019 with a 9:45 am arrival time. Patient will do his Covid test on 02/17/2019 at 8:00 am. Patient is on [facility] transportation which charges $32.50 each way for the patient.

## 2019-02-15 NOTE — Telephone Encounter (Signed)
I attempted to contact Tasha at [facility] regarding getting the patient scheduled for a port placement. A message was left for a return call.

## 2019-02-16 MED ORDER — CLINDAMYCIN PHOSPHATE 300 MG/50ML IV SOLN: 300.0000 mg | Freq: Once | INTRAVENOUS | Status: DC

## 2019-02-17 ENCOUNTER — Encounter: Admission: RE | Payer: Self-pay | Source: Home / Self Care

## 2019-02-17 ENCOUNTER — Ambulatory Visit: Admission: RE | Admit: 2019-02-17 | Payer: Medicaid Other | Source: Home / Self Care | Admitting: Vascular Surgery

## 2019-02-17 ENCOUNTER — Inpatient Hospital Stay: Admission: RE | Admit: 2019-02-17 | Payer: Medicaid Other | Source: Ambulatory Visit

## 2019-02-17 SURGERY — PORTA CATH INSERTION
Anesthesia: Moderate Sedation

## 2019-02-22 ENCOUNTER — Encounter (INDEPENDENT_AMBULATORY_CARE_PROVIDER_SITE_OTHER): Payer: Self-pay

## 2019-02-22 ENCOUNTER — Telehealth (INDEPENDENT_AMBULATORY_CARE_PROVIDER_SITE_OTHER): Payer: Self-pay

## 2019-02-22 NOTE — Telephone Encounter (Signed)
Spoke with Chad [name] at [facility] and the patient has been rescheduled for his port placement for 02/28/2019 with Dr. Lucky Cowboy. Patient arrive to the [facility] at 8:00 am for Covid testing then to the [facility] at 11:15 am for his procedure. The pre-procedure instructions have been faxed to [facility] at [facility].

## 2019-02-27 ENCOUNTER — Other Ambulatory Visit (INDEPENDENT_AMBULATORY_CARE_PROVIDER_SITE_OTHER): Payer: Self-pay | Admitting: Nurse Practitioner

## 2019-02-28 ENCOUNTER — Other Ambulatory Visit: Payer: Self-pay

## 2019-02-28 ENCOUNTER — Encounter
Admission: RE | Disposition: A | Discharge status: Skilled Nursing Facility | Payer: Self-pay | Source: Home / Self Care | Attending: Vascular Surgery

## 2019-02-28 ENCOUNTER — Other Ambulatory Visit
Admission: RE | Admit: 2019-02-28 | Discharge: 2019-02-28 | Disposition: A | Discharge status: Home / Self Care | Payer: Medicaid Other | Source: Ambulatory Visit | Attending: Vascular Surgery | Admitting: Vascular Surgery

## 2019-02-28 ENCOUNTER — Ambulatory Visit
Admission: RE | Admit: 2019-02-28 | Discharge: 2019-02-28 | Disposition: A | Discharge status: Skilled Nursing Facility | Payer: Medicaid Other | Attending: Vascular Surgery | Admitting: Vascular Surgery

## 2019-02-28 ENCOUNTER — Encounter: Payer: Self-pay | Admitting: Vascular Surgery

## 2019-02-28 DIAGNOSIS — Z8673 Personal history of transient ischemic attack (TIA), and cerebral infarction without residual deficits: Secondary | ICD-10-CM | POA: Diagnosis not present

## 2019-02-28 DIAGNOSIS — F17210 Nicotine dependence, cigarettes, uncomplicated: Secondary | ICD-10-CM | POA: Diagnosis not present

## 2019-02-28 DIAGNOSIS — L97919 Non-pressure chronic ulcer of unspecified part of right lower leg with unspecified severity: Secondary | ICD-10-CM | POA: Insufficient documentation

## 2019-02-28 DIAGNOSIS — K219 Gastro-esophageal reflux disease without esophagitis: Secondary | ICD-10-CM | POA: Diagnosis not present

## 2019-02-28 DIAGNOSIS — Z20828 Contact with and (suspected) exposure to other viral communicable diseases: Secondary | ICD-10-CM | POA: Insufficient documentation

## 2019-02-28 DIAGNOSIS — C669 Malignant neoplasm of unspecified ureter: Secondary | ICD-10-CM

## 2019-02-28 DIAGNOSIS — E785 Hyperlipidemia, unspecified: Secondary | ICD-10-CM | POA: Insufficient documentation

## 2019-02-28 DIAGNOSIS — Z8551 Personal history of malignant neoplasm of bladder: Secondary | ICD-10-CM | POA: Insufficient documentation

## 2019-02-28 DIAGNOSIS — Z880 Allergy status to penicillin: Secondary | ICD-10-CM | POA: Diagnosis not present

## 2019-02-28 DIAGNOSIS — Z8546 Personal history of malignant neoplasm of prostate: Secondary | ICD-10-CM | POA: Insufficient documentation

## 2019-02-28 DIAGNOSIS — D500 Iron deficiency anemia secondary to blood loss (chronic): Secondary | ICD-10-CM | POA: Diagnosis not present

## 2019-02-28 DIAGNOSIS — L97929 Non-pressure chronic ulcer of unspecified part of left lower leg with unspecified severity: Secondary | ICD-10-CM | POA: Insufficient documentation

## 2019-02-28 DIAGNOSIS — J449 Chronic obstructive pulmonary disease, unspecified: Secondary | ICD-10-CM | POA: Insufficient documentation

## 2019-02-28 DIAGNOSIS — R5382 Chronic fatigue, unspecified: Secondary | ICD-10-CM | POA: Insufficient documentation

## 2019-02-28 DIAGNOSIS — N179 Acute kidney failure, unspecified: Secondary | ICD-10-CM | POA: Insufficient documentation

## 2019-02-28 DIAGNOSIS — Z79899 Other long term (current) drug therapy: Secondary | ICD-10-CM | POA: Diagnosis not present

## 2019-02-28 DIAGNOSIS — Z8042 Family history of malignant neoplasm of prostate: Secondary | ICD-10-CM | POA: Diagnosis not present

## 2019-02-28 DIAGNOSIS — C661 Malignant neoplasm of right ureter: Secondary | ICD-10-CM | POA: Insufficient documentation

## 2019-02-28 DIAGNOSIS — B1920 Unspecified viral hepatitis C without hepatic coma: Secondary | ICD-10-CM | POA: Diagnosis not present

## 2019-02-28 DIAGNOSIS — D4959 Neoplasm of unspecified behavior of other genitourinary organ: Secondary | ICD-10-CM | POA: Diagnosis not present

## 2019-02-28 HISTORY — PX: PORTA CATH INSERTION: CATH118285

## 2019-02-28 LAB — SARS CORONAVIRUS 2 BY RT PCR (HOSPITAL ORDER, PERFORMED IN ~~LOC~~ HOSPITAL LAB): SARS Coronavirus 2: NEGATIVE

## 2019-02-28 SURGERY — PORTA CATH INSERTION
Anesthesia: Moderate Sedation

## 2019-02-28 MED ORDER — HYDROMORPHONE HCL 1 MG/ML IJ SOLN: 1.0000 mg | Freq: Once | INTRAMUSCULAR | Status: DC | PRN

## 2019-02-28 MED ORDER — MIDAZOLAM HCL 5 MG/5ML IJ SOLN
INTRAMUSCULAR | Status: AC
  Filled 2019-02-28: qty 5

## 2019-02-28 MED ORDER — SODIUM CHLORIDE 0.9 % IV SOLN
INTRAVENOUS | Status: DC
  Administered 2019-02-28: 10:00:00 via INTRAVENOUS

## 2019-02-28 MED ORDER — FAMOTIDINE 20 MG PO TABS: 40.0000 mg | ORAL_TABLET | Freq: Once | ORAL | Status: DC | PRN

## 2019-02-28 MED ORDER — SODIUM CHLORIDE 0.9 % IV SOLN
Freq: Once | INTRAVENOUS | Status: AC
  Administered 2019-02-28: 11:00:00
  Filled 2019-02-28: qty 80

## 2019-02-28 MED ORDER — DIPHENHYDRAMINE HCL 50 MG/ML IJ SOLN: 50.0000 mg | Freq: Once | INTRAMUSCULAR | Status: DC | PRN

## 2019-02-28 MED ORDER — METHYLPREDNISOLONE SODIUM SUCC 125 MG IJ SOLR: 125.0000 mg | Freq: Once | INTRAMUSCULAR | Status: DC | PRN

## 2019-02-28 MED ORDER — FENTANYL CITRATE (PF) 100 MCG/2ML IJ SOLN
INTRAMUSCULAR | Status: DC | PRN
  Administered 2019-02-28: 50 ug via INTRAVENOUS

## 2019-02-28 MED ORDER — CLINDAMYCIN PHOSPHATE 300 MG/50ML IV SOLN
INTRAVENOUS | Status: AC
  Filled 2019-02-28: qty 50

## 2019-02-28 MED ORDER — CLINDAMYCIN PHOSPHATE 300 MG/50ML IV SOLN
300.0000 mg | Freq: Once | INTRAVENOUS | Status: AC
  Administered 2019-02-28: 300 mg via INTRAVENOUS

## 2019-02-28 MED ORDER — FENTANYL CITRATE (PF) 100 MCG/2ML IJ SOLN
INTRAMUSCULAR | Status: AC
  Filled 2019-02-28: qty 2

## 2019-02-28 MED ORDER — ONDANSETRON HCL 4 MG/2ML IJ SOLN: 4.0000 mg | Freq: Four times a day (QID) | INTRAMUSCULAR | Status: DC | PRN

## 2019-02-28 MED ORDER — MIDAZOLAM HCL 2 MG/2ML IJ SOLN
INTRAMUSCULAR | Status: DC | PRN
  Administered 2019-02-28: 2 mg via INTRAVENOUS

## 2019-02-28 MED ORDER — MIDAZOLAM HCL 2 MG/ML PO SYRP: 8.0000 mg | ORAL_SOLUTION | Freq: Once | ORAL | Status: DC | PRN

## 2019-02-28 SURGICAL SUPPLY — 8 items
KIT PORT POWER 8FR ISP CVUE (Port) ×1 IMPLANT
PACK ANGIOGRAPHY (CUSTOM PROCEDURE TRAY) ×2 IMPLANT
PAD GROUND ADULT SPLIT (MISCELLANEOUS) ×1 IMPLANT
PENCIL ELECTRO HAND CTR (MISCELLANEOUS) ×2 IMPLANT
SUT MNCRL AB 4-0 PS2 18 (SUTURE) ×2 IMPLANT
SUT PROLENE 0 CT 1 30 (SUTURE) ×2 IMPLANT
SUT VIC AB 3-0 SH 27 (SUTURE) ×1
SUT VIC AB 3-0 SH 27X BRD (SUTURE) IMPLANT

## 2019-02-28 NOTE — Op Note (Signed)
      [facility]       Operative Note  Date: 02/28/2019  Preoperative diagnosis:  1. Urethral cancer  Postoperative diagnosis:  Same as above  Procedures: #1. Ultrasound guidance for vascular access to the right internal jugular vein. #2. Fluoroscopic guidance for placement of catheter. #3. Placement of CT compatible Port-A-Cath, right internal jugular vein.  Surgeon: Leotis Pain, MD.   Anesthesia: Local with moderate conscious sedation for approximately 20  minutes using 2 mg of Versed and 50 mcg of Fentanyl  Fluoroscopy time: less than 1 minute  Contrast used: 0  Estimated blood loss: 3 cc  Indication for the procedure:  The patient is a 58 y.o.male with urethral cancer.  The patient needs a Port-A-Cath for durable venous access, chemotherapy, lab draws, and CT scans. We are asked to place this. Risks and benefits were discussed and informed consent was obtained.  Description of procedure: The patient was brought to the vascular and interventional radiology suite.  Moderate conscious sedation was administered throughout the procedure during a face to face encounter with the patient with my supervision of the RN administering medicines and monitoring the patient's vital signs, pulse oximetry, telemetry and mental status throughout from the start of the procedure until the patient was taken to the recovery room. The right neck chest and shoulder were sterilely prepped and draped, and a sterile surgical field was created. Ultrasound was used to help visualize a patent right internal jugular vein. This was then accessed under direct ultrasound guidance without difficulty with the Seldinger needle and a permanent image was recorded. A J-wire was placed. After skin nick and dilatation, the peel-away sheath was then placed over the wire. I then anesthetized an area under the clavicle approximately 1-2 fingerbreadths. A transverse incision was created and an inferior pocket  was created with electrocautery and blunt dissection. The port was then brought onto the field, placed into the pocket and secured to the chest wall with 2 Prolene sutures. The catheter was connected to the port and tunneled from the subclavicular incision to the access site. Fluoroscopic guidance was then used to cut the catheter to an appropriate length. The catheter was then placed through the peel-away sheath and the peel-away sheath was removed. The catheter tip was parked in excellent location under fluorocoscopic guidance in the cavoatrial junction. The pocket was then irrigated with antibiotic impregnated saline and the wound was closed with a running 3-0 Vicryl and a 4-0 Monocryl. The access incision was closed with a single 4-0 Monocryl. The Huber needle was used to withdraw blood and flush the port with heparinized saline. Dermabond was then placed as a dressing. The patient tolerated the procedure well and was taken to the recovery room in stable condition.   Leotis Pain 02/28/2019 11:36 AM   This note was created with Dragon Medical transcription system. Any errors in dictation are purely unintentional.

## 2019-02-28 NOTE — H&P (Signed)
[facility] History & Physical Update  The patient was interviewed and re-examined.  The patient's previous History and Physical has been reviewed and is unchanged.  There is no change in the plan of care. We plan to proceed with the scheduled procedure.  Leotis Pain, MD  02/28/2019, 10:54 AM

## 2019-02-28 NOTE — Progress Notes (Signed)
Provided discharge instructions to Chad Dy, LPN at [facility] including that Dr. Lucky Cowboy informed patient that port is ready to use as needed. LPN stated understanding of discharge instructions and no further questions at this time. Sent patient's port card and discharge handout with him.  [facility] transport was contacted for return to [employer].

## 2019-02-28 NOTE — Discharge Instructions (Signed)
Moderate Conscious Sedation, Adult, Care After These instructions provide you with information about caring for yourself after your procedure. Your health care provider may also give you more specific instructions. Your treatment has been planned according to current medical practices, but problems sometimes occur. Call your health care provider if you have any problems or questions after your procedure. What can I expect after the procedure? After your procedure, it is common:  To feel sleepy for several hours.  To feel clumsy and have poor balance for several hours.  To have poor judgment for several hours.  To vomit if you eat too soon. Follow these instructions at home: For at least 24 hours after the procedure:   Do not: ? Participate in activities where you could fall or become injured. ? Drive. ? Use heavy machinery. ? Drink alcohol. ? Take sleeping pills or medicines that cause drowsiness. ? Make important decisions or sign legal documents. ? Take care of children on your own.  Rest. Eating and drinking  Follow the diet recommended by your health care provider.  If you vomit: ? Drink water, juice, or soup when you can drink without vomiting. ? Make sure you have little or no nausea before eating solid foods. General instructions  Have a responsible adult stay with you until you are awake and alert.  Take over-the-counter and prescription medicines only as told by your health care provider.  If you smoke, do not smoke without supervision.  Keep all follow-up visits as told by your health care provider. This is important. Contact a health care provider if:  You keep feeling nauseous or you keep vomiting.  You feel light-headed.  You develop a rash.  You have a fever. Get help right away if:  You have trouble breathing. This information is not intended to replace advice given to you by your health care provider. Make sure you discuss any questions you have  with your health care provider. Document Released: 05/04/2013 Document Revised: 06/26/2017 Document Reviewed: 11/03/2015 Elsevier Patient Education  [place] Insertion, Care After This sheet gives you information about how to care for yourself after your procedure. Your health care provider may also give you more specific instructions. If you have problems or questions, contact your health care provider. What can I expect after the procedure? After the procedure, it is common to have:  Discomfort at the port insertion site.  Bruising on the skin over the port. This should improve over 3-4 days. Follow these instructions at home: [name] care  After your port is placed, you will get a manufacturer's information card. The card has information about your port. Keep this card with you at all times.  Take care of the port as told by your health care provider. Ask your health care provider if you or a family member can get training for taking care of the port at home. A home health care nurse may also take care of the port.  Make sure to remember what type of port you have. Incision care      Follow instructions from your health care provider about how to take care of your port insertion site. Make sure you: ? Wash your hands with soap and water before and after you change your bandage (dressing). If soap and water are not available, use hand sanitizer. ? Change your dressing as told by your health care provider. ? Leave stitches (sutures), skin glue, or adhesive strips in place. These skin closures may  need to stay in place for 2 weeks or longer. If adhesive strip edges start to loosen and curl up, you may trim the loose edges. Do not remove adhesive strips completely unless your health care provider tells you to do that.  Check your port insertion site every day for signs of infection. Check for: ? Redness, swelling, or pain. ? Fluid or blood. ? Warmth. ? Pus or a  bad smell. Activity  Return to your normal activities as told by your health care provider. Ask your health care provider what activities are safe for you.  Do not lift anything that is heavier than 10 lb (4.5 kg), or the limit that you are told, until your health care provider says that it is safe. General instructions  Take over-the-counter and prescription medicines only as told by your health care provider.  Do not take baths, swim, or use a hot tub until your health care provider approves. Ask your health care provider if you may take showers. You may only be allowed to take sponge baths.  Do not drive for 24 hours if you were given a sedative during your procedure.  Wear a medical alert bracelet in case of an emergency. This will tell any health care providers that you have a port.  Keep all follow-up visits as told by your health care provider. This is important. Contact a health care provider if:  You cannot flush your port with saline as directed, or you cannot draw blood from the port.  You have a fever or chills.  You have redness, swelling, or pain around your port insertion site.  You have fluid or blood coming from your port insertion site.  Your port insertion site feels warm to the touch.  You have pus or a bad smell coming from the port insertion site. Get help right away if:  You have chest pain or shortness of breath.  You have bleeding from your port that you cannot control. Summary  Take care of the port as told by your health care provider. Keep the manufacturer's information card with you at all times.  Change your dressing as told by your health care provider.  Contact a health care provider if you have a fever or chills or if you have redness, swelling, or pain around your port insertion site.  Keep all follow-up visits as told by your health care provider. This information is not intended to replace advice given to you by your health care  provider. Make sure you discuss any questions you have with your health care provider. Document Released: 05/04/2013 Document Revised: 02/09/2018 Document Reviewed: 02/09/2018 Elsevier Patient Education  [place] An implanted port is a device that is placed under the skin. It is usually placed in the chest. The device can be used to give IV medicine, to take blood, or for dialysis. You may have an implanted port if:  You need IV medicine that would be irritating to the small veins in your hands or arms.  You need IV medicines, such as antibiotics, for a long period of time.  You need IV nutrition for a long period of time.  You need dialysis. Having a port means that your health care provider will not need to use the veins in your arms for these procedures. You may have fewer limitations when using a port than you would if you used other types of long-term IVs, and you will likely be able to return  to normal activities after your incision heals. An implanted port has two main parts:  Reservoir. The reservoir is the part where a needle is inserted to give medicines or draw blood. The reservoir is round. After it is placed, it appears as a small, raised area under your skin.  Catheter. The catheter is a thin, flexible tube that connects the reservoir to a vein. Medicine that is inserted into the reservoir goes into the catheter and then into the vein. How is my port accessed? To access your port:  A numbing cream may be placed on the skin over the port site.  Your health care provider will put on a mask and sterile gloves.  The skin over your port will be cleaned carefully with a germ-killing soap and allowed to dry.  Your health care provider will gently pinch the port and insert a needle into it.  Your health care provider will check for a blood return to make sure the port is in the vein and is not clogged.  If your port needs to remain accessed  to get medicine continuously (constant infusion), your health care provider will place a clear bandage (dressing) over the needle site. The dressing and needle will need to be changed every week, or as told by your health care provider. What is flushing? Flushing helps keep the port from getting clogged. Follow instructions from your health care provider about how and when to flush the port. Ports are usually flushed with saline solution or a medicine called heparin. The need for flushing will depend on how the port is used:  If the port is only used from time to time to give medicines or draw blood, the port may need to be flushed: ? Before and after medicines have been given. ? Before and after blood has been drawn. ? As part of routine maintenance. Flushing may be recommended every 4-6 weeks.  If a constant infusion is running, the port may not need to be flushed.  Throw away any syringes in a disposal container that is meant for sharp items (sharps container). You can buy a sharps container from a pharmacy, or you can make one by using an empty hard plastic bottle with a cover. How long will my port stay implanted? The port can stay in for as long as your health care provider thinks it is needed. When it is time for the port to come out, a surgery will be done to remove it. The surgery will be similar to the procedure that was done to put the port in. Follow these instructions at home:   Flush your port as told by your health care provider.  If you need an infusion over several days, follow instructions from your health care provider about how to take care of your port site. Make sure you: ? Wash your hands with soap and water before you change your dressing. If soap and water are not available, use alcohol-based hand sanitizer. ? Change your dressing as told by your health care provider. ? Place any used dressings or infusion bags into a plastic bag. Throw that bag in the trash. ? Keep  the dressing that covers the needle clean and dry. Do not get it wet. ? Do not use scissors or sharp objects near the tube. ? Keep the tube clamped, unless it is being used.  Check your port site every day for signs of infection. Check for: ? Redness, swelling, or pain. ? Fluid or blood. ?  Pus or a bad smell.  Protect the skin around the port site. ? Avoid wearing bra straps that rub or irritate the site. ? Protect the skin around your port from seat belts. Place a soft pad over your chest if needed.  Bathe or shower as told by your health care provider. The site may get wet as long as you are not actively receiving an infusion.  Return to your normal activities as told by your health care provider. Ask your health care provider what activities are safe for you.  Carry a medical alert card or wear a medical alert bracelet at all times. This will let health care providers know that you have an implanted port in case of an emergency. Get help right away if:  You have redness, swelling, or pain at the port site.  You have fluid or blood coming from your port site.  You have pus or a bad smell coming from the port site.  You have a fever. Summary  Implanted ports are usually placed in the chest for long-term IV access.  Follow instructions from your health care provider about flushing the port and changing bandages (dressings).  Take care of the area around your port by avoiding clothing that puts pressure on the area, and by watching for signs of infection.  Protect the skin around your port from seat belts. Place a soft pad over your chest if needed.  Get help right away if you have a fever or you have redness, swelling, pain, drainage, or a bad smell at the port site. This information is not intended to replace advice given to you by your health care provider. Make sure you discuss any questions you have with your health care provider. Document Released: 07/14/2005 Document  Revised: 11/05/2018 Document Reviewed: 08/16/2016 Elsevier Patient Education  2020 [employer].

## 2019-03-01 ENCOUNTER — Encounter: Payer: Self-pay | Admitting: Urology

## 2019-03-01 ENCOUNTER — Other Ambulatory Visit: Payer: Self-pay | Admitting: Radiology

## 2019-03-01 ENCOUNTER — Other Ambulatory Visit: Payer: Self-pay

## 2019-03-01 ENCOUNTER — Ambulatory Visit (INDEPENDENT_AMBULATORY_CARE_PROVIDER_SITE_OTHER): Payer: Medicaid Other | Admitting: Urology

## 2019-03-01 VITALS — BP 124/86 | HR 84 | Ht 67.0 in | Wt 153.0 lb

## 2019-03-01 DIAGNOSIS — N1330 Unspecified hydronephrosis: Secondary | ICD-10-CM

## 2019-03-01 DIAGNOSIS — Z01818 Encounter for other preprocedural examination: Secondary | ICD-10-CM

## 2019-03-01 DIAGNOSIS — C689 Malignant neoplasm of urinary organ, unspecified: Secondary | ICD-10-CM

## 2019-03-01 DIAGNOSIS — Z960 Presence of urogenital implants: Secondary | ICD-10-CM | POA: Diagnosis not present

## 2019-03-01 LAB — MICROSCOPIC EXAMINATION
Bacteria, UA: NONE SEEN
RBC: >30 /hpf — AB (ref 0–2)

## 2019-03-01 LAB — URINALYSIS, COMPLETE
Bilirubin, UA: NEGATIVE
Glucose, UA: NEGATIVE
Ketones, UA: NEGATIVE
Nitrite, UA: NEGATIVE
Specific Gravity, UA: 1.02 (ref 1.005–1.030)
Urobilinogen, Ur: 2 mg/dL — ABNORMAL HIGH (ref 0.2–1.0)
pH, UA: 6.5 (ref 5.0–7.5)

## 2019-03-01 NOTE — Progress Notes (Signed)
03/01/2019 2:01 PM   Chad [name] May 19, 1961 [id number]  Referring provider: [name], [address] [place],  [place] [address]  Chief Complaint  Patient presents with  . Follow-up    HPI: 58 year old male with metastatic upper tract urothelial carcinoma who returns today to discuss stent exchange.  He continues on Keytruda managed by Dr. per Monday.  His most recent imaging from 11/2018 is stable.  He is tolerating current treatment well.  He denies any urinary symptoms today.  No dysuria or gross hematuria or flank pain.  His last exchange was on 08/2018.  He reports today that he is feeling somewhat irritated about the current COVID situation.  He lives in a facility is not able to see his son or his dogs.  He really misses his  puppies.   PMH: Past Medical History:  Diagnosis Date  . Anemia   . Anxiety   . ARF (acute respiratory failure) ([place])   . Bladder cancer ([place])   . Cancer [facility])    prostate  . COPD (chronic obstructive pulmonary disease) ([place])   . Depression   . Dysphagia   . GERD (gastroesophageal reflux disease)   . Hepatitis    chronic hep c  . Hyperlipidemia   . Malignant neoplasm of colon ([place])   . Nerve pain   . Stroke [facility])     Surgical History: Past Surgical History:  Procedure Laterality Date  . COLON SURGERY     En bloc extended right hemicolectomy 07/2017  . CYSTOSCOPY W/ RETROGRADES Right 08/30/2018   Procedure: CYSTOSCOPY WITH RETROGRADE PYELOGRAM;  Surgeon: Hollice Espy, MD;  Location: [facility] ORS;  Service: Urology;  Laterality: Right;  . CYSTOSCOPY WITH STENT PLACEMENT Right 04/25/2018   Procedure: CYSTOSCOPY WITH STENT PLACEMENT;  Surgeon: Hollice Espy, MD;  Location: [facility] ORS;  Service: Urology;  Laterality: Right;  . CYSTOSCOPY WITH STENT PLACEMENT Right 08/30/2018   Procedure: [place] WITH STENT Exchange;  Surgeon: Hollice Espy, MD;  Location: [facility] ORS;  Service: Urology;  Laterality: Right;  . PORTA CATH INSERTION  N/A 02/28/2019   Procedure: PORTA CATH INSERTION;  Surgeon: Algernon Huxley, MD;  Location: [place] CV LAB;  Service: Cardiovascular;  Laterality: N/A;  . tumor removed       Home Medications:  Allergies as of 03/01/2019      Reactions   Penicillins Rash      Medication List       Accurate as of March 01, 2019  2:01 PM. If you have any questions, ask your nurse or doctor.        acetaminophen 325 MG tablet Commonly known as: TYLENOL Take 650 mg by mouth 3 (three) times daily.   cyclobenzaprine 10 MG tablet Commonly known as: FLEXERIL Take 10 mg by mouth at bedtime.   diclofenac sodium 1 % Gel Commonly known as: VOLTAREN Apply 2 g topically See admin instructions. Apply 2 g topically 4 times daily. Apply 2 g to left shoulder, apply 2 g to left hip and 2 g to left knee 4 times daily per [facility] instructions   escitalopram 5 MG tablet Commonly known as: LEXAPRO Take 5 mg by mouth daily.   gabapentin 100 MG capsule Commonly known as: NEURONTIN Take 100 mg by mouth 3 (three) times daily.   mirtazapine 7.5 MG tablet Commonly known as: REMERON Take 7.5 mg by mouth at bedtime.   oxycodone 5 MG capsule Commonly known as: OXY-IR Take 10 mg by mouth every 6 (six)  hours as needed.   pantoprazole 40 MG tablet Commonly known as: PROTONIX Take 40 mg by mouth daily.       Allergies:  Allergies  Allergen Reactions  . Penicillins Rash    Family History: Family History  Problem Relation Age of Onset  . Prostate cancer Father     Social History:  reports that he has been smoking cigarettes. He has been smoking about 1.00 pack per day. He has never used smokeless tobacco. He reports previous alcohol use. He reports previous drug use.  ROS: UROLOGY Frequent Urination?: No Hard to postpone urination?: No Burning/pain with urination?: No Get up at night to urinate?: No Leakage of urine?: No Urine stream starts and stops?: No Trouble starting stream?: No Do you have to  strain to urinate?: No Blood in urine?: No Urinary tract infection?: No Sexually transmitted disease?: No Injury to kidneys or bladder?: No Painful intercourse?: No Weak stream?: No Erection problems?: No Penile pain?: No  Gastrointestinal Nausea?: No Vomiting?: No Indigestion/heartburn?: No Diarrhea?: No Constipation?: No  Constitutional Fever: No Night sweats?: No Weight loss?: No Fatigue?: No  Skin Skin rash/lesions?: No Itching?: No  Eyes Blurred vision?: No Double vision?: No  Ears/Nose/Throat Sore throat?: No Sinus problems?: No  Hematologic/Lymphatic Swollen glands?: No Easy bruising?: No  Cardiovascular Leg swelling?: No Chest pain?: No  Respiratory Cough?: No Shortness of breath?: No  Endocrine Excessive thirst?: No  Musculoskeletal Back pain?: No Joint pain?: No  Neurological Headaches?: No Dizziness?: No  Psychologic Depression?: No Anxiety?: No  Physical Exam: BP 124/86   Pulse 84   Ht 5\' 7"  (1.702 m)   Wt 153 lb (69.4 kg)   BMI 23.96 kg/m   Constitutional:  Alert and oriented, No acute distress.  In a wheelchair.  Somewhat disheveled.  Left leg and right arm bandage present. HEENT: [place] AT, moist mucus membranes.  Trachea midline, no masses. Cardiovascular: No clubbing, cyanosis, or edema. Respiratory: Normal respiratory effort, no increased work of breathing. GI: Abdomen is soft, nontender, nondistended Skin: No rashes, bruises or suspicious lesions. Neurologic: Grossly intact, no focal deficits, moving all 4 extremities. Psychiatric: Normal mood and affect.  Laboratory Data: Lab Results  Component Value Date   WBC 6.2 02/14/2019   HGB 12.7 (L) 02/14/2019   HCT 39.9 02/14/2019   MCV 81.1 02/14/2019   PLT 192 02/14/2019    Lab Results  Component Value Date   CREATININE 0.92 02/14/2019    Urinalysis Pending  Pertinent Imaging: CLINICAL DATA:  Right renal pelvis/ureteral cancer, status post stent placement.  History of right colon cancer, diagnosed 2019.  EXAM: CT CHEST, ABDOMEN, AND PELVIS WITH CONTRAST  TECHNIQUE: Multidetector CT imaging of the chest, abdomen and pelvis was performed following the standard protocol during bolus administration of intravenous contrast.  CONTRAST:  175mL OMNIPAQUE IOHEXOL 300 MG/ML  SOLN  COMPARISON:  08/19/2018  FINDINGS: CT CHEST FINDINGS  Cardiovascular: Heart is normal in size.  No pericardial effusion.  No evidence of thoracic aortic aneurysm. Mild atherosclerotic calcifications of the aortic arch.  Coronary atherosclerosis the LAD.  Mediastinum/Nodes: Small mediastinal lymph nodes, including:  --9 mm short axis left axillary node (series 2/image 21), previously 10 mm  --8 mm short axis subcarinal node (series 2/image 32), unchanged  --8 mm short axis right hilar node (series 2/image 32), previously 7 mm  Visualized thyroid is unremarkable.  Lungs/Pleura: Minimal subpleural scarring in the lateral left upper lobe (series 3/image 85).  Mild dependent atelectasis in the bilateral lower  lobes.  No focal consolidation.  No suspicious pulmonary nodules.  No pleural effusion or pneumothorax.  Musculoskeletal: Visualized osseous structures are within normal limits.  CT ABDOMEN PELVIS FINDINGS  Hepatobiliary: Liver is within normal limits.  Gallbladder is unremarkable. No intrahepatic or extrahepatic ductal dilatation.  Pancreas: Within normal limits.  Spleen: Within normal limits.  Adrenals/Urinary Tract: Adrenal glands are within normal limits.  1.6 cm anterior left lower pole renal cyst (series 9/image 21). Left kidney is otherwise within normal limits.  Mild right renal atrophy with diminished/delayed enhancement. Two nonobstructing right lower pole renal calculi measuring up to 5 x 2 mm (series 2/image 79).  Persistent urothelial thickening involving the right renal pelvis/proximal  collecting system (series 2/image 35), corresponding to the patient's known urothelial carcinoma. Indwelling right double-pigtail ureteral stent. No hydronephrosis.  Bladder is within normal limits.  Stomach/Bowel: Stomach is within normal limits.  No evidence of bowel obstruction.  Status post right hemicolectomy with appendectomy.  Mild left colonic stool burden.  Fat density lesions with mild inflammatory changes in the left lower quadrant adjacent to the sigmoid colon suggest epiploic appendagitis versus omental infarcts (series 2/image 111).  Vascular/Lymphatic: No evidence of abdominal aortic aneurysm.  Small upper abdominal lymph nodes, including a 10 mm short axis portacaval node (series 2/image 63), unchanged.  Small retroperitoneal nodes, including a 7 mm short axis left para-aortic node, previously 8 mm.  Small pelvic lymph nodes, including:  --11 mm short axis left external iliac node (series 2/image 104), previously 12 mm  --13 mm short axis left pelvic sidewall node (series 2/image 108), previously 14 mm  --9 mm short axis right pelvic sidewall node (series 2/image 111), previously 11 mm  Reproductive: Prostate is grossly unremarkable.  Other: No abdominopelvic ascites.  Postsurgical changes in the right inguinal region.  Musculoskeletal: Visualized osseous structures are within normal limits.  IMPRESSION: Urothelial thickening involving the right renal pelvis/proximal collecting system, corresponding to the patient's known urothelial cancer. Associated right double-pigtail stent in satisfactory position. Secondary atrophy of the right kidney. No hydronephrosis.  Status post right hemicolectomy.  Small mediastinal and abdominopelvic lymph nodes, as described above, overall unchanged.  Suspected omental infarcts versus epiploic appendagitis in the left lower quadrant, adjacent to the sigmoid colon, new.   Electronically  Signed   By: Julian Hy M.D.   On: 12/11/2018 09:23  CT abdomen pelvis with contrast personally reviewed today.  Agree with radiologic interpretation.  Overall stable.  Assessment & Plan:    1. Urothelial cancer ([place]) Metastatic currently on Keytruda with palliative intent, managed by Dr. [name] Most recent imaging May/2020 stable which is reassuring  2. Retained ureteral stent Last exchanged 08/2026 Due for ureteral stent exchange, will consider spacing out stent exchanges depending on degree of encrustation Discussed risk of the procedure including bleeding, infection, damage surrounding structures amongst others Need for preoperative COVID testing was also discussed He is agreeable this plan Preoperative UA/urine culture   Hollice Espy, MD  [place] [address], [place], [place] [address] [phone]

## 2019-03-01 NOTE — H&P (View-Only) (Signed)
03/01/2019 2:01 PM   Chad [name] [birth year] [id number]  Referring provider: [name], [place],  [place] [address]  Chief Complaint  Patient presents with  . Follow-up    HPI: 58 year old male with metastatic upper tract urothelial carcinoma who returns today to discuss stent exchange.  He continues on Keytruda managed by Dr. per Monday.  His most recent imaging from 11/2018 is stable.  He is tolerating current treatment well.  He denies any urinary symptoms today.  No dysuria or gross hematuria or flank pain.  His last exchange was on 08/2018.  He reports today that he is feeling somewhat irritated about the current COVID situation.  He lives in a facility is not able to see his son or his dogs.  He really misses his  puppies.   PMH: Past Medical History:  Diagnosis Date  . Anemia   . Anxiety   . ARF (acute respiratory failure) ([place])   . Bladder cancer ([place])   . Cancer [facility])    prostate  . COPD (chronic obstructive pulmonary disease) ([place])   . Depression   . Dysphagia   . GERD (gastroesophageal reflux disease)   . Hepatitis    chronic hep c  . Hyperlipidemia   . Malignant neoplasm of colon ([place])   . Nerve pain   . Stroke [facility])     Surgical History: Past Surgical History:  Procedure Laterality Date  . COLON SURGERY     En bloc extended right hemicolectomy 07/2017  . CYSTOSCOPY W/ RETROGRADES Right 08/30/2018   Procedure: CYSTOSCOPY WITH RETROGRADE PYELOGRAM;  Surgeon: Hollice Espy, MD;  Location: [facility] ORS;  Service: Urology;  Laterality: Right;  . CYSTOSCOPY WITH STENT PLACEMENT Right 04/25/2018   Procedure: CYSTOSCOPY WITH STENT PLACEMENT;  Surgeon: Hollice Espy, MD;  Location: [facility] ORS;  Service: Urology;  Laterality: Right;  . CYSTOSCOPY WITH STENT PLACEMENT Right 08/30/2018   Procedure: [place] WITH STENT Exchange;  Surgeon: Hollice Espy, MD;  Location: [facility] ORS;  Service: Urology;  Laterality: Right;  . PORTA CATH INSERTION  N/A 02/28/2019   Procedure: PORTA CATH INSERTION;  Surgeon: Algernon Huxley, MD;  Location: [place] CV LAB;  Service: Cardiovascular;  Laterality: N/A;  . tumor removed       Home Medications:  Allergies as of 03/01/2019      Reactions   Penicillins Rash      Medication List       Accurate as of March 01, 2019  2:01 PM. If you have any questions, ask your nurse or doctor.        acetaminophen 325 MG tablet Commonly known as: TYLENOL Take 650 mg by mouth 3 (three) times daily.   cyclobenzaprine 10 MG tablet Commonly known as: FLEXERIL Take 10 mg by mouth at bedtime.   diclofenac sodium 1 % Gel Commonly known as: VOLTAREN Apply 2 g topically See admin instructions. Apply 2 g topically 4 times daily. Apply 2 g to left shoulder, apply 2 g to left hip and 2 g to left knee 4 times daily per [facility] instructions   escitalopram 5 MG tablet Commonly known as: LEXAPRO Take 5 mg by mouth daily.   gabapentin 100 MG capsule Commonly known as: NEURONTIN Take 100 mg by mouth 3 (three) times daily.   mirtazapine 7.5 MG tablet Commonly known as: REMERON Take 7.5 mg by mouth at bedtime.   oxycodone 5 MG capsule Commonly known as: OXY-IR Take 10 mg by mouth every 6 (six)  hours as needed.   pantoprazole 40 MG tablet Commonly known as: PROTONIX Take 40 mg by mouth daily.       Allergies:  Allergies  Allergen Reactions  . Penicillins Rash    Family History: Family History  Problem Relation Age of Onset  . Prostate cancer Father     Social History:  reports that he has been smoking cigarettes. He has been smoking about 1.00 pack per day. He has never used smokeless tobacco. He reports previous alcohol use. He reports previous drug use.  ROS: UROLOGY Frequent Urination?: No Hard to postpone urination?: No Burning/pain with urination?: No Get up at night to urinate?: No Leakage of urine?: No Urine stream starts and stops?: No Trouble starting stream?: No Do you have to  strain to urinate?: No Blood in urine?: No Urinary tract infection?: No Sexually transmitted disease?: No Injury to kidneys or bladder?: No Painful intercourse?: No Weak stream?: No Erection problems?: No Penile pain?: No  Gastrointestinal Nausea?: No Vomiting?: No Indigestion/heartburn?: No Diarrhea?: No Constipation?: No  Constitutional Fever: No Night sweats?: No Weight loss?: No Fatigue?: No  Skin Skin rash/lesions?: No Itching?: No  Eyes Blurred vision?: No Double vision?: No  Ears/Nose/Throat Sore throat?: No Sinus problems?: No  Hematologic/Lymphatic Swollen glands?: No Easy bruising?: No  Cardiovascular Leg swelling?: No Chest pain?: No  Respiratory Cough?: No Shortness of breath?: No  Endocrine Excessive thirst?: No  Musculoskeletal Back pain?: No Joint pain?: No  Neurological Headaches?: No Dizziness?: No  Psychologic Depression?: No Anxiety?: No  Physical Exam: BP 124/86   Pulse 84   Ht 5\' 7"  (1.702 m)   Wt 153 lb (69.4 kg)   BMI 23.96 kg/m   Constitutional:  Alert and oriented, No acute distress.  In a wheelchair.  Somewhat disheveled.  Left leg and right arm bandage present. HEENT: [place] AT, moist mucus membranes.  Trachea midline, no masses. Cardiovascular: No clubbing, cyanosis, or edema. Respiratory: Normal respiratory effort, no increased work of breathing. GI: Abdomen is soft, nontender, nondistended Skin: No rashes, bruises or suspicious lesions. Neurologic: Grossly intact, no focal deficits, moving all 4 extremities. Psychiatric: Normal mood and affect.  Laboratory Data: Lab Results  Component Value Date   WBC 6.2 02/14/2019   HGB 12.7 (L) 02/14/2019   HCT 39.9 02/14/2019   MCV 81.1 02/14/2019   PLT 192 02/14/2019    Lab Results  Component Value Date   CREATININE 0.92 02/14/2019    Urinalysis Pending  Pertinent Imaging: CLINICAL DATA:  Right renal pelvis/ureteral cancer, status post stent placement.  History of right colon cancer, diagnosed 2019.  EXAM: CT CHEST, ABDOMEN, AND PELVIS WITH CONTRAST  TECHNIQUE: Multidetector CT imaging of the chest, abdomen and pelvis was performed following the standard protocol during bolus administration of intravenous contrast.  CONTRAST:  170mL OMNIPAQUE IOHEXOL 300 MG/ML  SOLN  COMPARISON:  08/19/2018  FINDINGS: CT CHEST FINDINGS  Cardiovascular: Heart is normal in size.  No pericardial effusion.  No evidence of thoracic aortic aneurysm. Mild atherosclerotic calcifications of the aortic arch.  Coronary atherosclerosis the LAD.  Mediastinum/Nodes: Small mediastinal lymph nodes, including:  --9 mm short axis left axillary node (series 2/image 21), previously 10 mm  --8 mm short axis subcarinal node (series 2/image 32), unchanged  --8 mm short axis right hilar node (series 2/image 32), previously 7 mm  Visualized thyroid is unremarkable.  Lungs/Pleura: Minimal subpleural scarring in the lateral left upper lobe (series 3/image 85).  Mild dependent atelectasis in the bilateral lower  lobes.  No focal consolidation.  No suspicious pulmonary nodules.  No pleural effusion or pneumothorax.  Musculoskeletal: Visualized osseous structures are within normal limits.  CT ABDOMEN PELVIS FINDINGS  Hepatobiliary: Liver is within normal limits.  Gallbladder is unremarkable. No intrahepatic or extrahepatic ductal dilatation.  Pancreas: Within normal limits.  Spleen: Within normal limits.  Adrenals/Urinary Tract: Adrenal glands are within normal limits.  1.6 cm anterior left lower pole renal cyst (series 9/image 21). Left kidney is otherwise within normal limits.  Mild right renal atrophy with diminished/delayed enhancement. Two nonobstructing right lower pole renal calculi measuring up to 5 x 2 mm (series 2/image 79).  Persistent urothelial thickening involving the right renal pelvis/proximal  collecting system (series 2/image 35), corresponding to the patient's known urothelial carcinoma. Indwelling right double-pigtail ureteral stent. No hydronephrosis.  Bladder is within normal limits.  Stomach/Bowel: Stomach is within normal limits.  No evidence of bowel obstruction.  Status post right hemicolectomy with appendectomy.  Mild left colonic stool burden.  Fat density lesions with mild inflammatory changes in the left lower quadrant adjacent to the sigmoid colon suggest epiploic appendagitis versus omental infarcts (series 2/image 111).  Vascular/Lymphatic: No evidence of abdominal aortic aneurysm.  Small upper abdominal lymph nodes, including a 10 mm short axis portacaval node (series 2/image 63), unchanged.  Small retroperitoneal nodes, including a 7 mm short axis left para-aortic node, previously 8 mm.  Small pelvic lymph nodes, including:  --11 mm short axis left external iliac node (series 2/image 104), previously 12 mm  --13 mm short axis left pelvic sidewall node (series 2/image 108), previously 14 mm  --9 mm short axis right pelvic sidewall node (series 2/image 111), previously 11 mm  Reproductive: Prostate is grossly unremarkable.  Other: No abdominopelvic ascites.  Postsurgical changes in the right inguinal region.  Musculoskeletal: Visualized osseous structures are within normal limits.  IMPRESSION: Urothelial thickening involving the right renal pelvis/proximal collecting system, corresponding to the patient's known urothelial cancer. Associated right double-pigtail stent in satisfactory position. Secondary atrophy of the right kidney. No hydronephrosis.  Status post right hemicolectomy.  Small mediastinal and abdominopelvic lymph nodes, as described above, overall unchanged.  Suspected omental infarcts versus epiploic appendagitis in the left lower quadrant, adjacent to the sigmoid colon, new.   Electronically  Signed   By: Julian Hy M.D.   On: 12/11/2018 09:23  CT abdomen pelvis with contrast personally reviewed today.  Agree with radiologic interpretation.  Overall stable.  Assessment & Plan:    1. Urothelial cancer ([place]) Metastatic currently on Keytruda with palliative intent, managed by Dr. [name] Most recent imaging May/2020 stable which is reassuring  2. Retained ureteral stent Last exchanged 08/2026 Due for ureteral stent exchange, will consider spacing out stent exchanges depending on degree of encrustation Discussed risk of the procedure including bleeding, infection, damage surrounding structures amongst others Need for preoperative COVID testing was also discussed He is agreeable this plan Preoperative UA/urine culture   Hollice Espy, MD  [place] [address], [place], [place] [address] [phone]

## 2019-03-02 ENCOUNTER — Encounter
Admission: RE | Admit: 2019-03-02 | Discharge: 2019-03-02 | Disposition: A | Discharge status: Home / Self Care | Payer: Medicaid Other | Source: Ambulatory Visit | Attending: Urology | Admitting: Urology

## 2019-03-02 ENCOUNTER — Other Ambulatory Visit: Payer: Self-pay

## 2019-03-02 ENCOUNTER — Encounter: Payer: Self-pay | Admitting: *Deleted

## 2019-03-02 HISTORY — DX: Peripheral vascular disease, unspecified: I73.9

## 2019-03-02 HISTORY — DX: Unspecified hydronephrosis: N13.30

## 2019-03-02 NOTE — Patient Instructions (Signed)
Your procedure is scheduled on: 03/07/2019 Mon Report to Same Day Surgery 2nd floor medical mall [facility] Entrance-take elevator on left to 2nd floor.  Check in with surgery information desk.) To find out your arrival time please call [phone] between 1PM - 3PM on 03/04/2019 Fri  Remember: Instructions that are not followed completely may result in serious medical risk, up to and including death, or upon the discretion of your surgeon and anesthesiologist your surgery may need to be rescheduled.    _x___ 1. Do not eat food after midnight the night before your procedure. You may drink clear liquids up to 2 hours before you are scheduled to arrive at the hospital for your procedure.  Do not drink clear liquids within 2 hours of your scheduled arrival to the hospital.  Clear liquids include  --Water or Apple juice without pulp  --Clear carbohydrate beverage such as ClearFast or Gatorade  --Black Coffee or Clear Tea (No milk, no creamers, do not add anything to                  the coffee or Tea Type 1 and type 2 diabetics should only drink water.   ____Ensure clear carbohydrate drink on the way to the hospital for bariatric patients  ____Ensure clear carbohydrate drink 3 hours before surgery.   No gum chewing or hard candies.     __x__ 2. No Alcohol for 24 hours before or after surgery.   __x__3. No Smoking or e-cigarettes for 24 prior to surgery.  Do not use any chewable tobacco products for at least 6 hour prior to surgery   ____  4. Bring all medications with you on the day of surgery if instructed.    __x__ 5. Notify your doctor if there is any change in your medical condition     (cold, fever, infections).    x___6. On the morning of surgery brush your teeth with toothpaste and water.  You may rinse your mouth with mouth wash if you wish.  Do not swallow any toothpaste or mouthwash.   Do not wear jewelry, make-up, hairpins, clips or nail polish.  Do not wear lotions,  powders, or perfumes. You may wear deodorant.  Do not shave 48 hours prior to surgery. Men may shave face and neck.  Do not bring valuables to the hospital.    [facility] is not responsible for any belongings or valuables.               Contacts, dentures or bridgework may not be worn into surgery.  Leave your suitcase in the car. After surgery it may be brought to your room.  For patients admitted to the hospital, discharge time is determined by your                       treatment team.  _  Patients discharged the day of surgery will not be allowed to drive home.  You will need someone to drive you home and stay with you the night of your procedure.    Please read over the following fact sheets that you were given:   [facility] Preparing for Surgery and or MRSA Information   _x___ Take anti-hypertensive listed below, cardiac, seizure, asthma,     anti-reflux and psychiatric medicines. These include:  1. escitalopram (LEXAPRO) 5 MG tablet  2.gabapentin (NEURONTIN) 100 MG capsule  3.oxycodone (OXY-IR) 5 MG capsule if needed  4.pantoprazole (PROTONIX) 40 MG tablet  5.  6.  ____Fleets enema or Magnesium Citrate as directed.   ____ Use CHG Soap or sage wipes as directed on instruction sheet   ____ Use inhalers on the day of surgery and bring to hospital day of surgery  ____ Stop Metformin and Janumet 2 days prior to surgery.    ____ Take 1/2 of usual insulin dose the night before surgery and none on the morning     surgery.   _x___ Follow recommendations from Cardiologist, Pulmonologist or PCP regarding          stopping Aspirin, Coumadin, Plavix ,Eliquis, Effient, or Pradaxa, and Pletal.  X____Stop Anti-inflammatories such as Advil, Aleve, Ibuprofen, Motrin, Naproxen, Naprosyn, Goodies powders or aspirin products. OK to take Tylenol and                          Celebrex.   _x___ Stop supplements until after surgery.  But may continue Vitamin D, Vitamin B,       and  multivitamin.   ____ Bring C-Pap to the hospital.

## 2019-03-03 LAB — CULTURE, URINE COMPREHENSIVE

## 2019-03-06 MED ORDER — CIPROFLOXACIN IN D5W 400 MG/200ML IV SOLN
400.0000 mg | INTRAVENOUS | Status: AC
  Administered 2019-03-07: 400 mg via INTRAVENOUS

## 2019-03-07 ENCOUNTER — Ambulatory Visit: Payer: Medicaid Other | Admitting: Anesthesiology

## 2019-03-07 ENCOUNTER — Other Ambulatory Visit: Payer: Medicaid Other

## 2019-03-07 ENCOUNTER — Ambulatory Visit
Admission: RE | Admit: 2019-03-07 | Discharge: 2019-03-07 | Disposition: A | Discharge status: Home / Self Care | Payer: Medicaid Other | Attending: Urology | Admitting: Urology

## 2019-03-07 ENCOUNTER — Encounter: Payer: Self-pay | Admitting: *Deleted

## 2019-03-07 ENCOUNTER — Encounter
Admission: RE | Disposition: A | Discharge status: Home / Self Care | Payer: Self-pay | Source: Home / Self Care | Attending: Urology

## 2019-03-07 ENCOUNTER — Ambulatory Visit: Payer: Medicaid Other

## 2019-03-07 ENCOUNTER — Ambulatory Visit: Payer: Medicaid Other | Admitting: Internal Medicine

## 2019-03-07 ENCOUNTER — Other Ambulatory Visit
Admission: RE | Admit: 2019-03-07 | Discharge: 2019-03-07 | Disposition: A | Discharge status: Home / Self Care | Payer: Medicaid Other | Source: Ambulatory Visit | Attending: Urology | Admitting: Urology

## 2019-03-07 ENCOUNTER — Other Ambulatory Visit: Payer: Self-pay

## 2019-03-07 DIAGNOSIS — Z85038 Personal history of other malignant neoplasm of large intestine: Secondary | ICD-10-CM | POA: Diagnosis not present

## 2019-03-07 DIAGNOSIS — Z8619 Personal history of other infectious and parasitic diseases: Secondary | ICD-10-CM | POA: Insufficient documentation

## 2019-03-07 DIAGNOSIS — N1330 Unspecified hydronephrosis: Secondary | ICD-10-CM

## 2019-03-07 DIAGNOSIS — C801 Malignant (primary) neoplasm, unspecified: Secondary | ICD-10-CM | POA: Insufficient documentation

## 2019-03-07 DIAGNOSIS — Z8042 Family history of malignant neoplasm of prostate: Secondary | ICD-10-CM | POA: Insufficient documentation

## 2019-03-07 DIAGNOSIS — Z8673 Personal history of transient ischemic attack (TIA), and cerebral infarction without residual deficits: Secondary | ICD-10-CM | POA: Diagnosis not present

## 2019-03-07 DIAGNOSIS — Z9049 Acquired absence of other specified parts of digestive tract: Secondary | ICD-10-CM | POA: Diagnosis not present

## 2019-03-07 DIAGNOSIS — Z20828 Contact with and (suspected) exposure to other viral communicable diseases: Secondary | ICD-10-CM | POA: Insufficient documentation

## 2019-03-07 DIAGNOSIS — Z79899 Other long term (current) drug therapy: Secondary | ICD-10-CM | POA: Diagnosis not present

## 2019-03-07 DIAGNOSIS — N261 Atrophy of kidney (terminal): Secondary | ICD-10-CM | POA: Insufficient documentation

## 2019-03-07 DIAGNOSIS — Z880 Allergy status to penicillin: Secondary | ICD-10-CM | POA: Insufficient documentation

## 2019-03-07 DIAGNOSIS — C689 Malignant neoplasm of urinary organ, unspecified: Secondary | ICD-10-CM

## 2019-03-07 DIAGNOSIS — Z791 Long term (current) use of non-steroidal anti-inflammatories (NSAID): Secondary | ICD-10-CM | POA: Diagnosis not present

## 2019-03-07 DIAGNOSIS — C661 Malignant neoplasm of right ureter: Secondary | ICD-10-CM | POA: Diagnosis not present

## 2019-03-07 DIAGNOSIS — Z8551 Personal history of malignant neoplasm of bladder: Secondary | ICD-10-CM | POA: Diagnosis not present

## 2019-03-07 DIAGNOSIS — F17210 Nicotine dependence, cigarettes, uncomplicated: Secondary | ICD-10-CM | POA: Insufficient documentation

## 2019-03-07 DIAGNOSIS — E785 Hyperlipidemia, unspecified: Secondary | ICD-10-CM | POA: Insufficient documentation

## 2019-03-07 DIAGNOSIS — C7919 Secondary malignant neoplasm of other urinary organs: Secondary | ICD-10-CM | POA: Insufficient documentation

## 2019-03-07 DIAGNOSIS — F419 Anxiety disorder, unspecified: Secondary | ICD-10-CM | POA: Diagnosis not present

## 2019-03-07 DIAGNOSIS — J449 Chronic obstructive pulmonary disease, unspecified: Secondary | ICD-10-CM | POA: Diagnosis not present

## 2019-03-07 DIAGNOSIS — Z8546 Personal history of malignant neoplasm of prostate: Secondary | ICD-10-CM | POA: Insufficient documentation

## 2019-03-07 DIAGNOSIS — K219 Gastro-esophageal reflux disease without esophagitis: Secondary | ICD-10-CM | POA: Insufficient documentation

## 2019-03-07 DIAGNOSIS — F329 Major depressive disorder, single episode, unspecified: Secondary | ICD-10-CM | POA: Insufficient documentation

## 2019-03-07 HISTORY — DX: Frequency of micturition: R35.0

## 2019-03-07 HISTORY — DX: Pain in unspecified knee: M25.569

## 2019-03-07 HISTORY — PX: CYSTOSCOPY WITH STENT PLACEMENT: SHX5790

## 2019-03-07 LAB — URINE DRUG SCREEN, QUALITATIVE (ARMC ONLY)
Amphetamines, Ur Screen: NOT DETECTED
Barbiturates, Ur Screen: NOT DETECTED
Benzodiazepine, Ur Scrn: NOT DETECTED
Cannabinoid 50 Ng, Ur ~~LOC~~: NOT DETECTED
Cocaine Metabolite,Ur ~~LOC~~: NOT DETECTED
MDMA (Ecstasy)Ur Screen: NOT DETECTED
Methadone Scn, Ur: NOT DETECTED
Opiate, Ur Screen: POSITIVE — AB
Phencyclidine (PCP) Ur S: NOT DETECTED
Tricyclic, Ur Screen: POSITIVE — AB

## 2019-03-07 LAB — SARS CORONAVIRUS 2 BY RT PCR (HOSPITAL ORDER, PERFORMED IN ~~LOC~~ HOSPITAL LAB): SARS Coronavirus 2: NEGATIVE

## 2019-03-07 SURGERY — CYSTOSCOPY, WITH STENT INSERTION
Anesthesia: General | Laterality: Right

## 2019-03-07 MED ORDER — LACTATED RINGERS IV SOLN
INTRAVENOUS | Status: DC
  Administered 2019-03-07: 12:00:00 via INTRAVENOUS

## 2019-03-07 MED ORDER — FENTANYL CITRATE (PF) 100 MCG/2ML IJ SOLN
INTRAMUSCULAR | Status: AC
  Filled 2019-03-07: qty 2

## 2019-03-07 MED ORDER — PROPOFOL 10 MG/ML IV BOLUS
INTRAVENOUS | Status: DC | PRN
  Administered 2019-03-07: 110 mg via INTRAVENOUS
  Administered 2019-03-07: 90 mg via INTRAVENOUS

## 2019-03-07 MED ORDER — FENTANYL CITRATE (PF) 100 MCG/2ML IJ SOLN
INTRAMUSCULAR | Status: DC | PRN
  Administered 2019-03-07: 100 ug via INTRAVENOUS

## 2019-03-07 MED ORDER — CIPROFLOXACIN IN D5W 400 MG/200ML IV SOLN
INTRAVENOUS | Status: AC
  Filled 2019-03-07: qty 200

## 2019-03-07 MED ORDER — ONDANSETRON HCL 4 MG/2ML IJ SOLN: 4.0000 mg | Freq: Once | INTRAMUSCULAR | Status: DC | PRN

## 2019-03-07 MED ORDER — PROPOFOL 10 MG/ML IV BOLUS
INTRAVENOUS | Status: AC
  Filled 2019-03-07: qty 20

## 2019-03-07 MED ORDER — ONDANSETRON HCL 4 MG/2ML IJ SOLN
INTRAMUSCULAR | Status: AC
  Filled 2019-03-07: qty 2

## 2019-03-07 MED ORDER — LIDOCAINE HCL (PF) 2 % IJ SOLN
INTRAMUSCULAR | Status: AC
  Filled 2019-03-07: qty 10

## 2019-03-07 MED ORDER — FENTANYL CITRATE (PF) 100 MCG/2ML IJ SOLN: 25.0000 ug | INTRAMUSCULAR | Status: DC | PRN

## 2019-03-07 MED ORDER — LIDOCAINE HCL (CARDIAC) PF 100 MG/5ML IV SOSY
PREFILLED_SYRINGE | INTRAVENOUS | Status: DC | PRN
  Administered 2019-03-07: 80 mg via INTRAVENOUS

## 2019-03-07 SURGICAL SUPPLY — 20 items
BAG DRAIN CYSTO-URO LG1000N (MISCELLANEOUS) ×2 IMPLANT
BRUSH SCRUB EZ  4% CHG (MISCELLANEOUS)
BRUSH SCRUB EZ 4% CHG (MISCELLANEOUS) IMPLANT
CATH URETL 5X70 OPEN END (CATHETERS) ×2 IMPLANT
CONRAY 43 FOR UROLOGY 50M (MISCELLANEOUS) ×2 IMPLANT
GLOVE BIO SURGEON STRL SZ 6.5 (GLOVE) ×2 IMPLANT
GOWN STRL REUS W/ TWL LRG LVL3 (GOWN DISPOSABLE) ×2 IMPLANT
GOWN STRL REUS W/TWL LRG LVL3 (GOWN DISPOSABLE) ×2
GUIDEWIRE STR DUAL SENSOR (WIRE) ×2 IMPLANT
KIT TURNOVER CYSTO (KITS) ×2 IMPLANT
PACK CYSTO AR (MISCELLANEOUS) ×2 IMPLANT
SET CYSTO W/LG BORE CLAMP LF (SET/KITS/TRAYS/PACK) ×2 IMPLANT
SOL .9 NS 3000ML IRR  AL (IV SOLUTION) ×1
SOL .9 NS 3000ML IRR UROMATIC (IV SOLUTION) ×1 IMPLANT
STENT URET 6FRX24 CONTOUR (STENTS) IMPLANT
STENT URET 6FRX26 CONTOUR (STENTS) IMPLANT
STENT URO INLAY 6FRX26CM (STENTS) ×2 IMPLANT
SURGILUBE 2OZ TUBE FLIPTOP (MISCELLANEOUS) ×2 IMPLANT
SYRINGE IRR TOOMEY STRL 70CC (SYRINGE) ×2 IMPLANT
WATER STERILE IRR 1000ML POUR (IV SOLUTION) ×2 IMPLANT

## 2019-03-07 NOTE — Discharge Instructions (Signed)
You have a ureteral stent in place.  This is a tube that extends from your kidney to your bladder.  This may cause urinary bleeding, burning with urination, and urinary frequency.  Please call our office or present to the ED if you develop fevers >101 or pain which is not able to be controlled with oral pain medications.    Your stent will need to be exchanged in about 9 months.  I will see you again in 8 months to discuss her next stent exchange.  [facility] [address] [place], [place] [address] [phone]   AMBULATORY SURGERY  DISCHARGE INSTRUCTIONS   1) The drugs that you were given will stay in your system until tomorrow so for the next 24 hours you should not:  A) Drive an automobile B) Make any legal decisions C) Drink any alcoholic beverage   2) You may resume regular meals tomorrow.  Today it is better to start with liquids and gradually work up to solid foods.  You may eat anything you prefer, but it is better to start with liquids, then soup and crackers, and gradually work up to solid foods.   3) Please notify your doctor immediately if you have any unusual bleeding, trouble breathing, redness and pain at the surgery site, drainage, fever, or pain not relieved by medication.    4) Additional Instructions:        Please contact your physician with any problems or Same Day Surgery at [phone], Monday through Friday 6 am to 4 pm, or [place] at [facility] number at [phone].

## 2019-03-07 NOTE — Anesthesia Procedure Notes (Signed)
Procedure Name: LMA Insertion Date/Time: 03/07/2019 12:15 PM Performed by: Rona Ravens, CRNA Pre-anesthesia Checklist: Patient identified, Emergency Drugs available, Suction available, Patient being monitored and Timeout performed Patient Re-evaluated:Patient Re-evaluated prior to induction Oxygen Delivery Method: Circle system utilized Preoxygenation: Pre-oxygenation with 100% oxygen Induction Type: IV induction LMA: LMA inserted LMA Size: 4.0 Number of attempts: 1 Tube secured with: Tape Dental Injury: Teeth and Oropharynx as per pre-operative assessment

## 2019-03-07 NOTE — Interval H&P Note (Signed)
RRR Ctab  H&P update.  All questions answered. Plan for right stent exchange.

## 2019-03-07 NOTE — Anesthesia Post-op Follow-up Note (Signed)
Anesthesia QCDR form completed.        

## 2019-03-07 NOTE — Anesthesia Preprocedure Evaluation (Signed)
Anesthesia Evaluation  Patient identified by MRN, date of birth, ID band Patient awake    Reviewed: Allergy & Precautions, H&P , NPO status , Patient's Chart, lab work & pertinent test results, reviewed documented beta blocker date and time   Airway Mallampati: II  TM Distance: >3 FB Neck ROM: full    Dental  (+) Teeth Intact   Pulmonary COPD, Current Smoker,    Pulmonary exam normal        Cardiovascular Exercise Tolerance: Good + Peripheral Vascular Disease  Normal cardiovascular exam Rate:Normal     Neuro/Psych PSYCHIATRIC DISORDERS Anxiety Depression CVA    GI/Hepatic GERD  Medicated,(+) Hepatitis -  Endo/Other  negative endocrine ROS  Renal/GU Renal disease  negative genitourinary   Musculoskeletal   Abdominal   Peds  Hematology  (+) Blood dyscrasia, anemia ,   Anesthesia Other Findings   Reproductive/Obstetrics negative OB ROS                             Anesthesia Physical Anesthesia Plan  ASA: III  Anesthesia Plan: General LMA   Post-op Pain Management:    Induction:   PONV Risk Score and Plan:   Airway Management Planned:   Additional Equipment:   Intra-op Plan:   Post-operative Plan:   Informed Consent: I have reviewed the patients History and Physical, chart, labs and discussed the procedure including the risks, benefits and alternatives for the proposed anesthesia with the patient or authorized representative who has indicated his/her understanding and acceptance.       Plan Discussed with: CRNA  Anesthesia Plan Comments:         Anesthesia Quick Evaluation

## 2019-03-07 NOTE — Transfer of Care (Signed)
Immediate Anesthesia Transfer of Care Note  Patient: Chad [name]  Procedure(s) Performed: CYSTOSCOPY WITH STENT Exchange (Right )  Patient Location: PACU  Anesthesia Type:General  Level of Consciousness: sedated  Airway & Oxygen Therapy: Patient Spontanous Breathing and Patient connected to face mask oxygen  Post-op Assessment: Report given to RN and Post -op Vital signs reviewed and stable  Post vital signs: Reviewed and stable  Last Vitals:  Vitals Value Taken Time  BP 98/66 03/07/19 1230  Temp 36.2 C 03/07/19 1230  Pulse 75 03/07/19 1233  Resp 7 03/07/19 1233  SpO2 100 % 03/07/19 1233  Vitals shown include unvalidated device data.  Last Pain:  Vitals:   03/07/19 1230  TempSrc:   PainSc: 0-No pain         Complications: No apparent anesthesia complications

## 2019-03-07 NOTE — Op Note (Signed)
Date of procedure: 03/07/19  Preoperative diagnosis:  1. Right upper tract urothelial carcinoma 2. Right hydronephrosis  Postoperative diagnosis:  1. Same as above  Procedure: 1. Cystoscopy 2. Right ureteral stent exchange  Surgeon: Hollice Espy, MD  Anesthesia: General  Complications: None  Intraoperative findings:  Stent exchange without difficulty.  Minimal to no encrustation.  EBL: Minimal  Specimens: None  Drains: 6 x 26 French double-J ureteral stent on right, Bard optima  Indication: Chad [name] is a 58 y.o. patient with presumed metastatic urothelial carcinoma with obstructing tumor involving the right UPJ.  After reviewing the management options for treatment, he elected to proceed with the above surgical procedure(s). We have discussed the potential benefits and risks of the procedure, side effects of the proposed treatment, the likelihood of the patient achieving the goals of the procedure, and any potential problems that might occur during the procedure or recuperation. Informed consent has been obtained.  Description of procedure:  The patient was taken to the operating room and general anesthesia was induced.  The patient was placed in the dorsal lithotomy position, prepped and draped in the usual sterile fashion, and preoperative antibiotics were administered. A preoperative time-out was performed.   21 [place] scope was advanced per urethra into the bladder.  The bladder itself was carefully inspected and noted to be relatively normal without any evidence of tumor inflammation or infection.  The right UO was visualized with a stent emanating with a full coil which had minimal to no encrustation.  The distal coil the stent was grasped and brought to level the urethral meatus.  The stent was then cannulated using a sensor wire up to level of the kidney. The wire was then backloaded over the rigid cystoscope.  A 6 x 26 French Bard optima ureteral stent  was advanced over the wire up to level the kidney.  The wires partially drawn till focal is noted within the renal pelvis.  A full code was then noted within the bladder upon final removal.  The bladder was drained.  The patient was clean and dry, repositioned supine position, reversed anesthesia, taken the PACU in stable condition.  Plan: Patient return in 8 months to discuss his next stent exchange (will extend interval to 9 months given minimal to no encrustation)  Hollice Espy, M.D.

## 2019-03-07 NOTE — Anesthesia Postprocedure Evaluation (Signed)
Anesthesia Post Note  Patient: Chad [name]  Procedure(s) Performed: CYSTOSCOPY WITH STENT Exchange (Right )  Anesthesia Type: General     Last Vitals:  Vitals:   03/07/19 1053 03/07/19 1230  BP:  98/66  Pulse:  76  Resp:  (!) 6  Temp:  (!) 36.2 C  SpO2: 100% 99%    Last Pain:  Vitals:   03/07/19 1230  TempSrc:   PainSc: 0-No pain                 Rona Ravens

## 2019-03-08 ENCOUNTER — Encounter: Payer: Self-pay | Admitting: Urology

## 2019-03-10 ENCOUNTER — Other Ambulatory Visit: Payer: Self-pay

## 2019-03-11 ENCOUNTER — Other Ambulatory Visit: Payer: Self-pay

## 2019-03-11 ENCOUNTER — Inpatient Hospital Stay (HOSPITAL_BASED_OUTPATIENT_CLINIC_OR_DEPARTMENT_OTHER): Payer: Medicaid Other | Admitting: Internal Medicine

## 2019-03-11 ENCOUNTER — Inpatient Hospital Stay: Payer: Medicaid Other | Attending: Internal Medicine

## 2019-03-11 ENCOUNTER — Encounter: Payer: Self-pay | Admitting: Internal Medicine

## 2019-03-11 ENCOUNTER — Inpatient Hospital Stay: Payer: Medicaid Other

## 2019-03-11 VITALS — BP 134/83 | HR 80 | Temp 95.8°F | Resp 16 | Wt 152.0 lb

## 2019-03-11 DIAGNOSIS — Z8673 Personal history of transient ischemic attack (TIA), and cerebral infarction without residual deficits: Secondary | ICD-10-CM | POA: Insufficient documentation

## 2019-03-11 DIAGNOSIS — F17210 Nicotine dependence, cigarettes, uncomplicated: Secondary | ICD-10-CM | POA: Diagnosis not present

## 2019-03-11 DIAGNOSIS — C189 Malignant neoplasm of colon, unspecified: Secondary | ICD-10-CM | POA: Insufficient documentation

## 2019-03-11 DIAGNOSIS — R5382 Chronic fatigue, unspecified: Secondary | ICD-10-CM | POA: Diagnosis not present

## 2019-03-11 DIAGNOSIS — K219 Gastro-esophageal reflux disease without esophagitis: Secondary | ICD-10-CM | POA: Diagnosis not present

## 2019-03-11 DIAGNOSIS — Z9221 Personal history of antineoplastic chemotherapy: Secondary | ICD-10-CM | POA: Insufficient documentation

## 2019-03-11 DIAGNOSIS — B182 Chronic viral hepatitis C: Secondary | ICD-10-CM | POA: Insufficient documentation

## 2019-03-11 DIAGNOSIS — L97909 Non-pressure chronic ulcer of unspecified part of unspecified lower leg with unspecified severity: Secondary | ICD-10-CM | POA: Diagnosis not present

## 2019-03-11 DIAGNOSIS — R5381 Other malaise: Secondary | ICD-10-CM | POA: Insufficient documentation

## 2019-03-11 DIAGNOSIS — Z79899 Other long term (current) drug therapy: Secondary | ICD-10-CM | POA: Insufficient documentation

## 2019-03-11 DIAGNOSIS — Z95828 Presence of other vascular implants and grafts: Secondary | ICD-10-CM

## 2019-03-11 DIAGNOSIS — D509 Iron deficiency anemia, unspecified: Secondary | ICD-10-CM | POA: Insufficient documentation

## 2019-03-11 DIAGNOSIS — R600 Localized edema: Secondary | ICD-10-CM | POA: Insufficient documentation

## 2019-03-11 DIAGNOSIS — C661 Malignant neoplasm of right ureter: Secondary | ICD-10-CM

## 2019-03-11 DIAGNOSIS — I739 Peripheral vascular disease, unspecified: Secondary | ICD-10-CM

## 2019-03-11 DIAGNOSIS — J449 Chronic obstructive pulmonary disease, unspecified: Secondary | ICD-10-CM | POA: Insufficient documentation

## 2019-03-11 DIAGNOSIS — Z7189 Other specified counseling: Secondary | ICD-10-CM

## 2019-03-11 DIAGNOSIS — M25569 Pain in unspecified knee: Secondary | ICD-10-CM | POA: Insufficient documentation

## 2019-03-11 DIAGNOSIS — Z85038 Personal history of other malignant neoplasm of large intestine: Secondary | ICD-10-CM | POA: Insufficient documentation

## 2019-03-11 DIAGNOSIS — E785 Hyperlipidemia, unspecified: Secondary | ICD-10-CM | POA: Diagnosis not present

## 2019-03-11 DIAGNOSIS — Z791 Long term (current) use of non-steroidal anti-inflammatories (NSAID): Secondary | ICD-10-CM | POA: Insufficient documentation

## 2019-03-11 DIAGNOSIS — F419 Anxiety disorder, unspecified: Secondary | ICD-10-CM | POA: Insufficient documentation

## 2019-03-11 DIAGNOSIS — Z5112 Encounter for antineoplastic immunotherapy: Secondary | ICD-10-CM | POA: Insufficient documentation

## 2019-03-11 DIAGNOSIS — Z515 Encounter for palliative care: Secondary | ICD-10-CM

## 2019-03-11 LAB — CBC WITH DIFFERENTIAL/PLATELET
Abs Immature Granulocytes: 0.01 10*3/uL (ref 0.00–0.07)
Basophils Absolute: 0.1 10*3/uL (ref 0.0–0.1)
Basophils Relative: 1 %
Eosinophils Absolute: 0.5 10*3/uL (ref 0.0–0.5)
Eosinophils Relative: 6 %
HCT: 37.7 % — ABNORMAL LOW (ref 39.0–52.0)
Hemoglobin: 12 g/dL — ABNORMAL LOW (ref 13.0–17.0)
Immature Granulocytes: 0 %
Lymphocytes Relative: 12 %
Lymphs Abs: 1 10*3/uL (ref 0.7–4.0)
MCH: 25.6 pg — ABNORMAL LOW (ref 26.0–34.0)
MCHC: 31.8 g/dL (ref 30.0–36.0)
MCV: 80.6 fL (ref 80.0–100.0)
Monocytes Absolute: 0.6 10*3/uL (ref 0.1–1.0)
Monocytes Relative: 7 %
Neutro Abs: 5.8 10*3/uL (ref 1.7–7.7)
Neutrophils Relative %: 74 %
Platelets: 173 10*3/uL (ref 150–400)
RBC: 4.68 MIL/uL (ref 4.22–5.81)
RDW: 14.9 % (ref 11.5–15.5)
WBC: 7.8 10*3/uL (ref 4.0–10.5)
nRBC: 0 % (ref 0.0–0.2)

## 2019-03-11 LAB — COMPREHENSIVE METABOLIC PANEL
ALT: 45 U/L — ABNORMAL HIGH (ref 0–44)
AST: 46 U/L — ABNORMAL HIGH (ref 15–41)
Albumin: 3.6 g/dL (ref 3.5–5.0)
Alkaline Phosphatase: 119 U/L (ref 38–126)
Anion gap: 7 (ref 5–15)
BUN: 20 mg/dL (ref 6–20)
CO2: 26 mmol/L (ref 22–32)
Calcium: 8.9 mg/dL (ref 8.9–10.3)
Chloride: 104 mmol/L (ref 98–111)
Creatinine, Ser: 0.88 mg/dL (ref 0.61–1.24)
GFR calc Af Amer: >60 mL/min (ref >60)
GFR calc non Af Amer: >60 mL/min (ref >60)
Glucose, Bld: 106 mg/dL — ABNORMAL HIGH (ref 70–99)
Potassium: 4.2 mmol/L (ref 3.5–5.1)
Sodium: 137 mmol/L (ref 135–145)
Total Bilirubin: 0.5 mg/dL (ref 0.3–1.2)
Total Protein: 7.8 g/dL (ref 6.5–8.1)

## 2019-03-11 MED ORDER — SODIUM CHLORIDE 0.9 % IV SOLN
200.0000 mg | Freq: Once | INTRAVENOUS | Status: AC
  Administered 2019-03-11: 11:00:00 200 mg via INTRAVENOUS
  Filled 2019-03-11: qty 8

## 2019-03-11 MED ORDER — SODIUM CHLORIDE 0.9% FLUSH
10.0000 mL | Freq: Once | INTRAVENOUS | Status: AC
  Administered 2019-03-11: 10:00:00 10 mL via INTRAVENOUS
  Filled 2019-03-11: qty 10

## 2019-03-11 MED ORDER — HEPARIN SOD (PORK) LOCK FLUSH 100 UNIT/ML IV SOLN
500.0000 [IU] | Freq: Once | INTRAVENOUS | Status: AC | PRN
  Administered 2019-03-11: 500 [IU]

## 2019-03-11 MED ORDER — SODIUM CHLORIDE 0.9 % IV SOLN
Freq: Once | INTRAVENOUS | Status: AC
  Administered 2019-03-11: 11:00:00 via INTRAVENOUS
  Filled 2019-03-11: qty 250

## 2019-03-11 NOTE — Assessment & Plan Note (Addendum)
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 05/15-C/A/P- stable; slight ureteral thickening/small pelvic/retroperitoneal lymph nodes.  STABLE  # Proceed with cycle # 13 of keytruda today. Labs today reviewed;  acceptable for treatment today. will order CT scan today.   #Chronic mild fatigue-multifactorial  STABLE.  #Colon cancer.  Stage IIII- STABLE  #Iron deficiency anemia-hemoglobin 12.7-STABLE>   # Bilateral LE ulcers-s/p wound care evaluation; stable. referral to vascular surgery- Dr.Dew  #DISPOSITION:  Chad [name] today;  # referral to Dr.Dew re: PVD/ leg ulcers.  # follow up 3 weeks -MD labs-cbc/cmp/Keytruda;CT C/A/P scan prior; - Dr.B

## 2019-03-11 NOTE — Progress Notes (Signed)
Patient does not offer any problems today.  

## 2019-03-11 NOTE — Progress Notes (Signed)
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; # Colon ca- stage III  GOALS: palliative  CURRENT/MOST RECENT THERAPY: Keytruda    Urothelial cancer ([place])   Initial Diagnosis   Urothelial cancer ([place])   Ureteral cancer, right ([place])  05/26/2018 Initial Diagnosis   Ureteral cancer, right ([place])   06/21/2018 -  Chemotherapy   The patient had pembrolizumab (KEYTRUDA) 200 mg in sodium chloride 0.9 % 50 mL chemo infusion, 200 mg, Intravenous, Once, 13 of 14 cycles Administration: 200 mg (06/21/2018), 200 mg (07/12/2018), 200 mg (08/02/2018), 200 mg (08/23/2018), 200 mg (09/13/2018), 200 mg (10/04/2018), 200 mg (10/25/2018), 200 mg (11/22/2018), 200 mg (12/13/2018), 200 mg (01/05/2019), 200 mg (01/24/2019), 200 mg (02/14/2019), 200 mg (03/11/2019)  for chemotherapy treatment.       HISTORY OF PRESENTING ILLNESS: Patient is a poor historian.  Is alone.  Gracyn Santillanes 58 y.o.  male above history of stage IV-ureteral cancer/with multiple co-morbidities currently on Keytruda is here for follow-up.   In the interim patient had a port placed  Patient continues to complain of bilateral lower extremity pain/cramping.  Complains of knee pain.  He has not seen vascular physician yet.  He denies any blood in stools or black or stools.  No nausea vomiting.  No diarrhea.   Review of Systems  Constitutional: Positive for malaise/fatigue. Negative for chills,  diaphoresis, fever and weight loss.  HENT: Negative for nosebleeds and sore throat.   Eyes: Negative for double vision.  Respiratory: Positive for cough and shortness of breath. Negative for hemoptysis and wheezing.   Cardiovascular: Negative for chest pain, palpitations and orthopnea.  Gastrointestinal: Negative for abdominal pain, blood in stool, constipation, diarrhea, heartburn, melena, nausea and vomiting.  Genitourinary: Negative for dysuria, frequency and urgency.  Musculoskeletal: Positive for back pain and joint pain.  Skin: Negative.  Negative for itching and rash.  Neurological: Positive for focal weakness. Negative for dizziness, tingling, weakness and headaches.       Chronic left-sided weakness upper than lower extremity.  Endo/Heme/Allergies: Does not bruise/bleed easily.  Psychiatric/Behavioral: Negative for depression. The patient is not nervous/anxious and does not have insomnia.      MEDICAL HISTORY:  Past Medical History:  Diagnosis Date  . Anemia   . Anxiety   . ARF (acute respiratory failure) ([place])   . Bladder cancer ([place])   . Cancer [facility])    prostate  . COPD (chronic obstructive pulmonary disease) ([place])   . Depression   . Dysphagia   . GERD (gastroesophageal reflux disease)   . Hepatitis    chronic hep c  . Hydronephrosis   . Hyperlipidemia   . Knee pain    Left  . Malignant neoplasm of colon ([place])   . Nerve pain   . Peripheral vascular disease ([place])   . Stroke ([place])   . Urinary frequency     SURGICAL HISTORY: Past Surgical History:  Procedure Laterality Date  . COLON SURGERY  En bloc extended right hemicolectomy 07/2017  . CYSTOSCOPY W/ RETROGRADES Right 08/30/2018   Procedure: CYSTOSCOPY WITH RETROGRADE PYELOGRAM;  Surgeon: Hollice Espy, MD;  Location: [facility] ORS;  Service: Urology;  Laterality: Right;  . CYSTOSCOPY WITH STENT PLACEMENT Right 04/25/2018   Procedure: CYSTOSCOPY WITH STENT PLACEMENT;  Surgeon: Hollice Espy, MD;  Location:  [facility] ORS;  Service: Urology;  Laterality: Right;  . CYSTOSCOPY WITH STENT PLACEMENT Right 08/30/2018   Procedure: [place] WITH STENT Exchange;  Surgeon: Hollice Espy, MD;  Location: [facility] ORS;  Service: Urology;  Laterality: Right;  . CYSTOSCOPY WITH STENT PLACEMENT Right 03/07/2019   Procedure: CYSTOSCOPY WITH STENT Exchange;  Surgeon: Hollice Espy, MD;  Location: [facility] OR[facility];  Service: Urology;  Laterality: Right;  . PORTA CATH INSERTION N/A 02/28/2019   Procedure: PORTA CATH INSERTION;  Surgeon: Algernon Huxley, MD;  Location: [place] [facility];  Service: Cardiovascular;  Laterality: N/A;  . tumor removed       SOCIAL HISTORY: Social History   Socioeconomic History  . Marital status: Single    Spouse name: Not on file  . Number of children: Not on file  . Years of education: Not on file  . Highest education level: Not on file  Occupational History  . Not on file  Social Needs  . Financial resource strain: Not on file  . Food insecurity    Worry: Not on file    Inability: Not on file  . Transportation needs    Medical: Not on file    Non-medical: Not on file  Tobacco Use  . Smoking status: Current Every Day Smoker    Packs/day: 1.00    Types: Cigarettes  . Smokeless tobacco: Never Used  Substance and Sexual Activity  . Alcohol use: Not Currently    Frequency: Never  . Drug use: Not Currently  . Sexual activity: Not on file  Lifestyle  . Physical activity    Days per week: Not on file    Minutes per session: Not on file  . Stress: Not on file  Relationships  . Social Herbalist on phone: Not on file    Gets together: Not on file    Attends religious service: Not on file    Active member of club or organization: Not on file    Attends meetings of clubs or organizations: Not on file    Relationship status: Not on file  . Intimate partner violence    Fear of current or ex partner: Not on file    Emotionally abused: Not on file    Physically abused: Not  on file    Forced sexual activity: Not on file  Other Topics Concern  . Not on file  Social History Narrative    used to live [place]; moved  To [place]- end of April 2019; in Nursing home; 1pp/day; quit alcohol. 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        Family- dad- prostate ca [at 64y]; brother- 57 died of prostate cancer; brother- 33- no cancers [[place]]; sonGerald Stabs [[place]];[name]-32y prostate ca [facility] [place]]; daughter- 57 [[place]]; another daughter 72 [NM/addict]. will refer genetics counseling. Given MSI- abnormal; highly suspicious of Lynch syndrome.  Patient's son [name] aware of high possible lynch syndrome.    FAMILY HISTORY: Family History  Problem Relation Age of Onset  . Prostate cancer Father     ALLERGIES:  is allergic to penicillins.  MEDICATIONS:  Current Outpatient Medications  Medication Sig Dispense  Refill  . acetaminophen (TYLENOL) 325 MG tablet Take 650 mg by mouth 3 (three) times daily.    . diclofenac sodium (VOLTAREN) 1 % GEL Apply 2 g topically See admin instructions. Apply 2 g topically 4 times daily. Apply 2 g to left shoulder, apply 2 g to left hip and 2 g to left knee 4 times daily per [facility] instructions    . escitalopram (LEXAPRO) 5 MG tablet Take 5 mg by mouth daily.    . mirtazapine (REMERON) 7.5 MG tablet Take 7.5 mg by mouth at bedtime.    Marland Kitchen oxycodone (OXY-IR) 5 MG capsule Take 10 mg by mouth every 6 (six) hours as needed.    . pantoprazole (PROTONIX) 40 MG tablet Take 40 mg by mouth daily.    . cyclobenzaprine (FLEXERIL) 10 MG tablet Take 10 mg by mouth at bedtime.    . gabapentin (NEURONTIN) 100 MG capsule Take 100 mg by mouth 3 (three) times daily.     No current facility-administered medications for this visit.       Marland Kitchen  PHYSICAL EXAMINATION: ECOG PERFORMANCE STATUS: 1 - Symptomatic but completely ambulatory  Vitals:   03/11/19 0947  BP: 134/83  Pulse: 80  Resp: 16  Temp: (!) 95.8 F (35.4 C)   Filed Weights   03/11/19 0947  Weight: 152 lb  (68.9 kg)    Physical Exam  Constitutional: He is oriented to person, place, and time.  In wheelchair. Cachectic.  Appears older than his stated age.  Alone.  HENT:  Head: Normocephalic and atraumatic.  Mouth/Throat: Oropharynx is clear and moist. No oropharyngeal exudate.  Eyes: Pupils are equal, round, and reactive to light.  Neck: Normal range of motion. Neck supple.  Cardiovascular: Normal rate and regular rhythm.  Pulmonary/Chest: No respiratory distress. He has no wheezes.  Abdominal: Soft. Bowel sounds are normal. He exhibits no distension and no mass. There is no abdominal tenderness. There is no rebound and no guarding.  Musculoskeletal: Normal range of motion.        General: No tenderness or edema.  Neurological: He is oriented to person, place, and time.  Sleepy but easily arousable.  Chronic weakness left upper and lower extremity.  Skin: Skin is warm.  Bilateral lower extremity ulcerations noted.  Pulses intact bilaterally.  Psychiatric: Affect normal.     LABORATORY DATA:  I have reviewed the data as listed Lab Results  Component Value Date   WBC 7.8 03/11/2019   HGB 12.0 (L) 03/11/2019   HCT 37.7 (L) 03/11/2019   MCV 80.6 03/11/2019   PLT 173 03/11/2019   Recent Labs    01/24/19 0815 02/14/19 0833 03/11/19 0927  NA 138 137 137  K 4.2 4.0 4.2  CL 105 103 104  CO2 '24 26 26  ' GLUCOSE 101* 101* 106*  BUN '14 20 20  ' CREATININE 0.93 0.92 0.88  CALCIUM 9.0 9.0 8.9  GFRNONAA >60 >60 >60  GFRAA >60 >60 >60  PROT 8.4* 8.1 7.8  ALBUMIN 3.9 3.6 3.6  AST 19 30 46*  ALT 9 23 45*  ALKPHOS 87 89 119  BILITOT 0.6 0.5 0.5    RADIOGRAPHIC STUDIES: I have personally reviewed the radiological images as listed and agreed with the findings in the report. No results found.  ASSESSMENT & PLAN:   Ureteral cancer, right ([place]) # High-grade urothelial cancer/cytology; likely of the right renal pelvis /upper ureter. 05/15-C/A/P- stable; slight ureteral  thickening/small pelvic/retroperitoneal lymph nodes.  STABLE  # Proceed with cycle #  13 of keytruda today. Labs today reviewed;  acceptable for treatment today. will order CT scan today.   #Chronic mild fatigue-multifactorial  STABLE.  #Colon cancer.  Stage IIII- STABLE  #Iron deficiency anemia-hemoglobin 12.7-STABLE>   # Bilateral LE ulcers-s/p wound care evaluation; stable. referral to vascular surgery- Dr.Dew  #DISPOSITION:  [name] today;  # referral to Dr.Dew re: PVD/ leg ulcers.  # follow up 3 weeks -MD labs-cbc/cmp/Keytruda;CT C/A/P scan prior; - Dr.B  All questions were answered. The patient knows to call the clinic with any problems, questions or concerns.    Cammie Sickle, MD 03/13/2019 6:15 PM

## 2019-03-21 ENCOUNTER — Telehealth: Payer: Self-pay | Admitting: Licensed Clinical Social Worker

## 2019-03-21 NOTE — Telephone Encounter (Signed)
Called patient to confirm he would be able to come to his genetic counseling appointment 8/27 at 10 am, and offered virtual. He would prefer to come in person.

## 2019-03-23 ENCOUNTER — Other Ambulatory Visit: Payer: Self-pay

## 2019-03-24 ENCOUNTER — Other Ambulatory Visit: Payer: Self-pay

## 2019-03-24 ENCOUNTER — Inpatient Hospital Stay (HOSPITAL_BASED_OUTPATIENT_CLINIC_OR_DEPARTMENT_OTHER): Payer: Medicaid Other | Admitting: Licensed Clinical Social Worker

## 2019-03-24 ENCOUNTER — Encounter: Payer: Self-pay | Admitting: Licensed Clinical Social Worker

## 2019-03-24 ENCOUNTER — Inpatient Hospital Stay: Payer: Medicaid Other

## 2019-03-24 DIAGNOSIS — Z85038 Personal history of other malignant neoplasm of large intestine: Secondary | ICD-10-CM

## 2019-03-24 DIAGNOSIS — Z806 Family history of leukemia: Secondary | ICD-10-CM | POA: Insufficient documentation

## 2019-03-24 DIAGNOSIS — Z800 Family history of malignant neoplasm of digestive organs: Secondary | ICD-10-CM | POA: Insufficient documentation

## 2019-03-24 DIAGNOSIS — Z8051 Family history of malignant neoplasm of kidney: Secondary | ICD-10-CM | POA: Insufficient documentation

## 2019-03-24 DIAGNOSIS — C689 Malignant neoplasm of urinary organ, unspecified: Secondary | ICD-10-CM

## 2019-03-24 DIAGNOSIS — Z8042 Family history of malignant neoplasm of prostate: Secondary | ICD-10-CM | POA: Diagnosis not present

## 2019-03-24 DIAGNOSIS — C661 Malignant neoplasm of right ureter: Secondary | ICD-10-CM | POA: Diagnosis not present

## 2019-03-24 NOTE — Progress Notes (Signed)
REFERRING PROVIDER: Cammie Sickle, MD [place],  [place] [id number]  PRIMARY PROVIDER:  [name], MD  PRIMARY REASON FOR VISIT:  1. Urothelial cancer ([place])   2. Family history of prostate cancer   3. Family history of colon cancer   4. Family history of leukemia   5. Family history of kidney cancer   6. Ureteral cancer, right ([place])   7. History of colon cancer      HISTORY OF PRESENT ILLNESS:   Chad [name], a 58 y.o. male, was seen for a [place] cancer [name] consultation at the request of Dr. [name] due to a personal and family history of cancer.  Chad [name] presents to clinic today to discuss the possibility of a hereditary predisposition to cancer, genetic testing, and to further clarify his future cancer risks, as well as potential cancer risks for family members.   In 2019, at the age of 29, Chad [name] was diagnosed with urothelial carcinoma of the right ureter. In January 2019 he was diagnosed with right colon cancer, no adjuvant therapy. Tumor testing revealed loss of MSH2 and MSH6, MSI-High.   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; # Colon ca- stage III  GOALS: palliative  CURRENT/MOST RECENT THERAPY: Keytruda    Urothelial cancer ([place])   Initial Diagnosis   Urothelial cancer ([place])   Ureteral cancer, right ([place])  05/26/2018 Initial Diagnosis   Ureteral cancer, right ([place])   06/21/2018 -  Chemotherapy   The patient had pembrolizumab (KEYTRUDA) 200 mg in sodium chloride 0.9 % 50 mL chemo infusion, 200 mg, Intravenous, Once, 13 of 14  cycles Administration: 200 mg (06/21/2018), 200 mg (07/12/2018), 200 mg (08/02/2018), 200 mg (08/23/2018), 200 mg (09/13/2018), 200 mg (10/04/2018), 200 mg (10/25/2018), 200 mg (11/22/2018), 200 mg (12/13/2018), 200 mg (01/05/2019), 200 mg (01/24/2019), 200 mg (02/14/2019), 200 mg (03/11/2019)  for chemotherapy treatment.        Past Medical History:  Diagnosis Date  . Anemia   . Anxiety   . ARF (acute respiratory failure) ([place])   . Bladder cancer ([place])   . Cancer [facility])    prostate  . COPD (chronic obstructive pulmonary disease) ([place])   . Depression   . Dysphagia   . Family history of colon cancer   . Family history of kidney cancer   . Family history of leukemia   . Family history of prostate cancer   . GERD (gastroesophageal reflux disease)   . Hepatitis    chronic hep c  . Hydronephrosis   . Hyperlipidemia   . [name] pain    Left  . Malignant neoplasm of colon ([place])   . Nerve pain   . Peripheral vascular disease ([place])   . Stroke ([place])   . Urinary frequency     Past Surgical History:  Procedure Laterality Date  . COLON SURGERY     En bloc extended right hemicolectomy 07/2017  . CYSTOSCOPY W/ RETROGRADES Right 08/30/2018   Procedure: CYSTOSCOPY WITH RETROGRADE PYELOGRAM;  Surgeon: Hollice Espy, MD;  Location: [facility];  Service: Urology;  Laterality: Right;  .  CYSTOSCOPY WITH STENT PLACEMENT Right 04/25/2018   Procedure: CYSTOSCOPY WITH STENT PLACEMENT;  Surgeon: Hollice Espy, MD;  Location: [facility];  Service: Urology;  Laterality: Right;  . CYSTOSCOPY WITH STENT PLACEMENT Right 08/30/2018   Procedure: [place] WITH STENT Exchange;  Surgeon: Hollice Espy, MD;  Location: [facility];  Service: Urology;  Laterality: Right;  . CYSTOSCOPY WITH STENT PLACEMENT Right 03/07/2019   Procedure: CYSTOSCOPY WITH STENT Exchange;  Surgeon: Hollice Espy, MD;  Location: [facility];  Service: Urology;  Laterality: Right;  . PORTA CATH INSERTION N/A 02/28/2019   Procedure: PORTA CATH INSERTION;   Surgeon: Algernon Huxley, MD;  Location: [place] [facility];  Service: Cardiovascular;  Laterality: N/A;  . tumor removed       Social History   Socioeconomic History  . Marital status: Single    Spouse name: Not on file  . Number of children: Not on file  . Years of education: Not on file  . Highest education level: Not on file  Occupational History  . Not on file  Social Needs  . Financial resource strain: Not on file  . Food insecurity    Worry: Not on file    Inability: Not on file  . Transportation needs    Medical: Not on file    Non-medical: Not on file  Tobacco Use  . Smoking status: Current Every Day Smoker    Packs/day: 1.00    Types: Cigarettes  . Smokeless tobacco: Never Used  Substance and Sexual Activity  . Alcohol use: Not Currently    Frequency: Never  . Drug use: Not Currently  . Sexual activity: Not on file  Lifestyle  . Physical activity    Days per week: Not on file    Minutes per session: Not on file  . Stress: Not on file  Relationships  . Social Herbalist on phone: Not on file    Gets together: Not on file    Attends religious service: Not on file    Active member of club or organization: Not on file    Attends meetings of clubs or organizations: Not on file    Relationship status: Not on file  Other Topics Concern  . Not on file  Social History Narrative    used to live [place]; moved  To [place]- end of April 2019; in Nursing home; 1pp/day; quit alcohol. 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        Family- dad- prostate ca [at 57y]; brother- 76 died of prostate cancer; brother- 90- no cancers [[place]]; sonGerald Stabs [[place]];[name]-32y prostate ca [facility] [place]]; daughter- 76 [[place]]; another daughter 19 [[place]/addict]. will refer [name] counseling. Given MSI- abnormal; highly suspicious of Lynch syndrome.  Patient's son [name] aware of high possible lynch syndrome.     FAMILY HISTORY:  We obtained a detailed, 4-generation family history.   Significant diagnoses are listed below: Family History  Problem Relation Age of Onset  . Prostate cancer Father 37  . Cancer Brother 16       unsure type  . Cancer Paternal Uncle        unsure type  . Cancer Maternal Grandmother        unsure type  . Cancer Paternal Grandmother        unsure type  . Kidney cancer Paternal Grandfather   . Cancer Other        unsure types  . Leukemia Son   . Cancer Son  other cancers, possibly colon    Chad [name] has 2 sons, [name], 16, and [name], 9. He also has 2 daughters, [name], 73 and Wells Guiles, 31. [name] has had leukemia and other cancers, but patient is unsure which kinds. He believes he may have had colon cancer. Chad [name] also had 2 brothers. His brother, Elta Guadeloupe, passed at 65 from cancer, but patient is unsure what type. His other brother, Ronalee Belts, is 47 and has not had cancer but may have had genetic testing.  Mr. Statzer mother is living at 34, no cancers. He has one maternal uncle, no cancers. No cancers in cousins. Maternal grandmother died of cancer at 65 but he is unsure the type, he has no information about maternal grandfather.  Mr. Reineck father had prostate cancer and died from it at 75. Patient had 2 paternal aunts, 1 paternal uncle, no known cancers. He does report cancers in paternal cousins but he does not know exactly who had cancer or what kinds. His paternal grandmother had cancer, unsure the type. Paternal grandfather had kidney cancer.   Mr. Bilotta is unaware of previous family history of genetic testing for hereditary cancer risks. There is no reported Ashkenazi Jewish ancestry. There is no known consanguinity.  GENETIC COUNSELING ASSESSMENT: Chad [name] is a 58 y.o. male with a personal and family history which is somewhat suggestive of a hereditary cancer syndrome such as Lynch syndrome and predisposition to cancer. We, therefore, discussed and recommended the following at today's visit.   DISCUSSION: We  discussed that 5-7% of colon cancer is hereditary, with most cases due to Lynch Syndrome, also called HNPCC (Hereditary Non-Polyposis Colon Cancer).  We are concerned about this syndrome in him given the tumor results from his colon cancer. This syndrome increases the risk for colon, uterine, ovarian and stomach cancers, brain cancers, as well as others such as ureothelial.  Families with Lynch Syndrome tend to have multiple family members with these cancers, typically diagnosed under age 54, and diagnoses in multiple generations. The genes that are known to cause Lynch Syndrome are called MLH1, MSH2, MSH6, PMS2 and EPCAM.  We discussed that there are several other genes that are associated with an increased risk for colon cancer.  We also dicussed that there are many genes that cause many different types of cancer risks.    We reviewed the characteristics, features and inheritance patterns of hereditary cancer syndromes. We also discussed genetic testing, including the appropriate family members to test, the process of testing, insurance coverage and turn-around-time for results. We discussed the implications of a negative, positive and/or variant of uncertain significant result. We recommended Chad [name] pursue genetic testing for the Common Hereditary Cancers + Renal cancer gene panel.   The Common Hereditary Cancers Panel offered by [facility] includes sequencing and/or deletion duplication testing of the following 48 genes: APC, ATM, AXIN2, BARD1, BMPR1A, BRCA1, BRCA2, BRIP1, CDH1, CDKN2A (p14ARF), CDKN2A (p16INK4a), CKD4, CHEK2, CTNNA1, DICER1, EPCAM (Deletion/duplication testing only), GREM1 (promoter region deletion/duplication testing only), KIT, MEN1, MLH1, MSH2, MSH3, MSH6, MUTYH, NBN, NF1, NHTL1, PALB2, PDGFRA, PMS2, POLD1, POLE, PTEN, RAD50, RAD51C, RAD51D, RNF43, SDHB, SDHC, SDHD, SMAD4, SMARCA4. STK11, TP53, TSC1, TSC2, and VHL.  The following genes were evaluated for sequence changes only: SDHA  and HOXB13 c.251G>A variant only.  The [facility] Renal/Urinary Tract Cancers Panel analyzes the following 24 genes:BAP1 ,CDC73, CDKN1C, DICER1, DIS3L2, EPCAM, FH, FLCN, GPC3, MET, MLH1, MSH2, MSH6, PMS2, PTEN, SDHB, SDHC, SMARCA4, SMARCB1, TP53, TSC1, TSC2, VHL, WT1.  Based on Mr.  [name]'s personal and family history of cancer, he meets medical criteria for genetic testing. Despite that he meets criteria, he may still have an out of pocket cost.  PLAN: After considering the risks, benefits, and limitations, Chad [name] provided informed consent to pursue genetic testing and the blood sample was [facility] to [facility] for analysis of the Common Hereditary Cancers Panel + Renal Cancer/Urinary Tract Cancers Panel. Results should be available within approximately 2-3 weeks' time, at which point they will be disclosed by telephone to Chad [name], as will any additional recommendations warranted by these results. Chad [name] will receive a summary of his genetic counseling visit and a copy of his results once available. This information will also be available in Epic.   Lastly, we encouraged Chad [name] to remain in contact with cancer [name] annually so that we can continuously update the family history and inform him of any changes in cancer [name] and testing that may be of benefit for this family.   Chad [name] questions were answered to his satisfaction today. Our contact information was provided should additional questions or concerns arise. Thank you for the referral and allowing [place] to share in the care of your patient.   Faith Rogue, MS, [facility] Genetic Counselor [email or web address] Phone: [phone]  The patient was seen for a total of 30 minutes in face-to-face genetic counseling.  Dr. Grayland [name] was available for discussion regarding this case.   _______________________________________________________________________ For Office Staff:  Number of people involved in  session: 1 Was an Intern/ student involved with case: no

## 2019-03-30 ENCOUNTER — Ambulatory Visit: Admission: RE | Admit: 2019-03-30 | Payer: Medicaid Other | Source: Ambulatory Visit

## 2019-04-01 ENCOUNTER — Inpatient Hospital Stay: Payer: Medicaid Other | Admitting: Internal Medicine

## 2019-04-01 ENCOUNTER — Inpatient Hospital Stay: Payer: Medicaid Other | Attending: Internal Medicine

## 2019-04-01 ENCOUNTER — Inpatient Hospital Stay: Payer: Medicaid Other

## 2019-04-01 NOTE — Progress Notes (Deleted)
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; # Colon ca- stage III  GOALS: palliative  CURRENT/MOST RECENT THERAPY: Keytruda    Urothelial cancer (Chad [name])   Initial Diagnosis   Urothelial cancer (Chad [name])   Ureteral cancer, right (Chad [name])  05/26/2018 Initial Diagnosis   Ureteral cancer, right (Chad [name])   06/21/2018 -  Chemotherapy   The patient had pembrolizumab (KEYTRUDA) 200 mg in sodium chloride 0.9 % 50 mL chemo infusion, 200 mg, Intravenous, Once, 13 of 14 cycles Administration: 200 mg (06/21/2018), 200 mg (07/12/2018), 200 mg (08/02/2018), 200 mg (08/23/2018), 200 mg (09/13/2018), 200 mg (10/04/2018), 200 mg (10/25/2018), 200 mg (11/22/2018), 200 mg (12/13/2018), 200 mg (01/05/2019), 200 mg (01/24/2019), 200 mg (02/14/2019), 200 mg (03/11/2019)  for chemotherapy treatment.       HISTORY OF PRESENTING ILLNESS: Patient is a poor historian.  Is alone.  Chad [name] 58 y.o.  male above history of stage IV-ureteral cancer/with multiple co-morbidities currently on Keytruda is here for follow-up.   In the interim patient had a port placed  Patient continues to complain of bilateral lower extremity pain/cramping.  Complains of knee pain.  He has not seen vascular physician yet.  He denies any blood in stools or black or stools.  No nausea vomiting.  No diarrhea.   Review of Systems  Constitutional: Positive for malaise/fatigue. Negative for chills,  diaphoresis, fever and weight loss.  HENT: Negative for nosebleeds and sore throat.   Eyes: Negative for double vision.  Respiratory: Positive for cough and shortness of breath. Negative for hemoptysis and wheezing.   Cardiovascular: Negative for chest pain, palpitations and orthopnea.  Gastrointestinal: Negative for abdominal pain, blood in stool, constipation, diarrhea, heartburn, melena, nausea and vomiting.  Genitourinary: Negative for dysuria, frequency and urgency.  Musculoskeletal: Positive for back pain and joint pain.  Skin: Negative.  Negative for itching and rash.  Neurological: Positive for focal weakness. Negative for dizziness, tingling, weakness and headaches.       Chronic left-sided weakness upper than lower extremity.  Endo/Heme/Allergies: Does not bruise/bleed easily.  Psychiatric/Behavioral: Negative for depression. The patient is not nervous/anxious and does not have insomnia.      MEDICAL HISTORY:  Past Medical History:  Diagnosis Date  . Anemia   . Anxiety   . ARF (acute respiratory failure) (Chad [name])   . Bladder cancer (Chad [name])   . Cancer [facility])    prostate  . COPD (chronic obstructive pulmonary disease) ([place])   . Depression   . Dysphagia   . Family history of colon cancer   . Family history of kidney cancer   . Family history of leukemia   . Family history of prostate cancer   . GERD (gastroesophageal reflux disease)   . Hepatitis    chronic hep c  . Hydronephrosis   . Hyperlipidemia   . Knee pain    Left  . Malignant neoplasm of colon (Chad [name])   . Nerve pain   . Peripheral vascular  disease ([place])   . Stroke ([place])   . Urinary frequency     SURGICAL HISTORY: Past Surgical History:  Procedure Laterality Date  . COLON SURGERY     En bloc extended right hemicolectomy 07/2017  . CYSTOSCOPY W/ RETROGRADES Right 08/30/2018   Procedure: CYSTOSCOPY WITH RETROGRADE PYELOGRAM;  Surgeon: Hollice Espy, MD;  Location: [facility] OR[facility];  Service: Urology;  Laterality: Right;   . CYSTOSCOPY WITH STENT PLACEMENT Right 04/25/2018   Procedure: CYSTOSCOPY WITH STENT PLACEMENT;  Surgeon: Hollice Espy, MD;  Location: [facility] OR[facility];  Service: Urology;  Laterality: Right;  . CYSTOSCOPY WITH STENT PLACEMENT Right 08/30/2018   Procedure: [place] WITH STENT Exchange;  Surgeon: Hollice Espy, MD;  Location: [facility] OR[facility];  Service: Urology;  Laterality: Right;  . CYSTOSCOPY WITH STENT PLACEMENT Right 03/07/2019   Procedure: CYSTOSCOPY WITH STENT Exchange;  Surgeon: Hollice Espy, MD;  Location: [facility];  Service: Urology;  Laterality: Right;  . PORTA CATH INSERTION N/A 02/28/2019   Procedure: PORTA CATH INSERTION;  Surgeon: Algernon Huxley, MD;  Location: [facility];  Service: Cardiovascular;  Laterality: N/A;  . tumor removed       SOCIAL HISTORY: Social History   Socioeconomic History  . Marital status: Single    Spouse name: Not on file  . Number of children: Not on file  . Years of education: Not on file  . Highest education level: Not on file  Occupational History  . Not on file  Social Needs  . Financial resource strain: Not on file  . Food insecurity    Worry: Not on file    Inability: Not on file  . Transportation needs    Medical: Not on file    Non-medical: Not on file  Tobacco Use  . Smoking status: Current Every Day Smoker    Packs/day: 1.00    Types: Cigarettes  . Smokeless tobacco: Never Used  Substance and Sexual Activity  . Alcohol use: Not Currently    Frequency: Never  . Drug use: Not Currently  . Sexual activity: Not on file  Lifestyle  . Physical activity    Days per week: Not on file    Minutes per session: Not on file  . Stress: Not on file  Relationships  . Social Herbalist on phone: Not on file    Gets together: Not on file    Attends religious service: Not on file    Active member of club or organization: Not on file    Attends meetings of clubs or organizations: Not on file    Relationship status: Not on file   . Intimate partner violence    Fear of current or ex partner: Not on file    Emotionally abused: Not on file    Physically abused: Not on file    Forced sexual activity: Not on file  Other Topics Concern  . Not on file  Social History Narrative    used to live [place]; moved  To [place]- end of April 2019; in Nursing home; 1pp/day; quit alcohol. 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        Family- dad- prostate ca [at 24y]; brother- 39 died of prostate cancer; brother- 23- no cancers [[place]]; sonGerald Stabs [[place]];[name]-32y prostate ca [facility] [place]]; daughter- 53 [[place]]; another daughter 79 [[place]/addict]. will refer genetics counseling. Given MSI- abnormal; highly suspicious of Lynch syndrome.  Patient's son [name] aware of high possible lynch syndrome.    FAMILY HISTORY:  Family History  Problem Relation Age of Onset  . Prostate cancer Father 39  . Cancer Brother 49       unsure type  . Cancer Paternal Uncle        unsure type  . Cancer Maternal Grandmother        unsure type  . Cancer Paternal Grandmother        unsure type  . Kidney cancer Paternal Grandfather   . Cancer Other        unsure types  . Leukemia Son   . Cancer Son        other cancers, possibly colon    ALLERGIES:  is allergic to penicillins.  MEDICATIONS:  Current Outpatient Medications  Medication Sig Dispense Refill  . acetaminophen (TYLENOL) 325 MG tablet Take 650 mg by mouth 3 (three) times daily.    . cyclobenzaprine (FLEXERIL) 10 MG tablet Take 10 mg by mouth at bedtime.    . diclofenac sodium (VOLTAREN) 1 % GEL Apply 2 g topically See admin instructions. Apply 2 g topically 4 times daily. Apply 2 g to left shoulder, apply 2 g to left hip and 2 g to left knee 4 times daily per [facility] instructions    . escitalopram (LEXAPRO) 5 MG tablet Take 5 mg by mouth daily.    Marland Kitchen gabapentin (NEURONTIN) 100 MG capsule Take 100 mg by mouth 3 (three) times daily.    . mirtazapine (REMERON) 7.5 MG tablet Take 7.5 mg by mouth at  bedtime.    Marland Kitchen oxycodone (OXY-IR) 5 MG capsule Take 10 mg by mouth every 6 (six) hours as needed.    . pantoprazole (PROTONIX) 40 MG tablet Take 40 mg by mouth daily.     No current facility-administered medications for this visit.       Marland Kitchen  PHYSICAL EXAMINATION: ECOG PERFORMANCE STATUS: 1 - Symptomatic but completely ambulatory  There were no vitals filed for this visit. There were no vitals filed for this visit.  Physical Exam  Constitutional: He is oriented to person, place, and time.  In wheelchair. Cachectic.  Appears older than his stated age.  Alone.  HENT:  Head: Normocephalic and atraumatic.  Mouth/Throat: Oropharynx is clear and moist. No oropharyngeal exudate.  Eyes: Pupils are equal, round, and reactive to light.  Neck: Normal range of motion. Neck supple.  Cardiovascular: Normal rate and regular rhythm.  Pulmonary/Chest: No respiratory distress. He has no wheezes.  Abdominal: Soft. Bowel sounds are normal. He exhibits no distension and no mass. There is no abdominal tenderness. There is no rebound and no guarding.  Musculoskeletal: Normal range of motion.        General: No tenderness or edema.  Neurological: He is oriented to person, place, and time.  Sleepy but easily arousable.  Chronic weakness left upper and lower extremity.  Skin: Skin is warm.  Bilateral lower extremity ulcerations noted.  Pulses intact bilaterally.  Psychiatric: Affect normal.     LABORATORY DATA:  I have reviewed the data as listed Lab Results  Component Value Date   WBC 7.8 03/11/2019   HGB 12.0 (L) 03/11/2019   HCT 37.7 (L) 03/11/2019   MCV 80.6 03/11/2019   PLT 173 03/11/2019   Recent Labs    01/24/19 0815 02/14/19 0833 03/11/19 0927  NA 138 137 137  K 4.2 4.0 4.2  CL 105 103 104  CO2 _0 GLUCOSE 101* 101* 106*  BUN _1 CREATININE 0.93 0.92 0.88  CALCIUM 9.0 9.0 8.9  GFRNONAA >60 >60 >60  GFRAA >60 >60 >60  PROT 8.4* 8.1 7.8  ALBUMIN 3.9 3.6 3.6  AST  19 30 46*  ALT 9 23 45*  ALKPHOS 87 89 119  BILITOT 0.6 0.5 0.5    RADIOGRAPHIC STUDIES: I have personally reviewed the radiological images as listed and agreed with the findings in the report. No results found.  ASSESSMENT & PLAN:   No problem-specific Assessment & Plan notes found for this encounter.  All questions were answered. The patient knows to call the clinic with any problems, questions or concerns.    Cammie Sickle, MD 04/01/2019 8:16 AM

## 2019-04-01 NOTE — Assessment & Plan Note (Deleted)
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 05/15-C/A/P- stable; slight ureteral thickening/small pelvic/retroperitoneal lymph nodes.  STABLE  # Proceed with cycle # 13 of keytruda today. Labs today reviewed;  acceptable for treatment today. will order CT scan today.   #Chronic mild fatigue-multifactorial  STABLE.  #Colon cancer.  Stage IIII- STABLE  #Iron deficiency anemia-hemoglobin 12.7-STABLE>   # Bilateral LE ulcers-s/p wound care evaluation; stable. referral to vascular surgery- Dr.Dew  #DISPOSITION:  # keytruda today;  # referral to Dr.Dew re: PVD/ leg ulcers.  # follow up 3 weeks -MD labs-cbc/cmp/Keytruda;CT C/A/P scan prior; - Dr.B 

## 2019-04-08 ENCOUNTER — Non-Acute Institutional Stay: Payer: Medicaid Other | Admitting: Nurse Practitioner

## 2019-04-08 ENCOUNTER — Encounter: Payer: Self-pay | Admitting: Nurse Practitioner

## 2019-04-08 VITALS — BP 121/70 | HR 78 | Temp 97.6°F | Resp 18 | Wt 147.0 lb

## 2019-04-08 DIAGNOSIS — Z515 Encounter for palliative care: Secondary | ICD-10-CM

## 2019-04-08 NOTE — Progress Notes (Signed)
[place] Consult Note Telephone: [phone]  Fax: [phone]  PATIENT NAME: Chad [name] DOB: [birth year] MRN: [id number]  PRIMARY CARE PROVIDER:   [name], MD  REFERRING PROVIDER: Dr Hodges/[facility] RESPONSIBLE PARTY:  Self  RECOMMENDATIONS and PLAN: 1.ACP: DNR; treat what is treatable including hospitalizations if necessary  2.Anorexia secondary to protein calorie malnutritionrelated to cancerimprovingongoing.Continue to monitor daily weights, supplements, supportive measures and courage to eat  3.Generalized weaknesssecondary to multiple chronic disease progression including anemia, late onset CVA, urothelial and colon cancer.Encourage energy conservation and rest times.  4. Chronic pain monitor on pain scale, monitor efficacy vs adverse side effects. Currently taking scheduled gabapentin; tylenol in addition to oxycodone 10mg  q6hrs/not at maximum usage; will continue to monitor; encouraged to ask when in pain, may consider MsContin with worsening wounds if not managed on maximized dose of Oxycodone; Continue to follow by psychotherapy and psychiatry for counseling; supportive services.   5. Palliative care encounter Z51.5; Palliative medicine team will continue to support patient, patient's family, and medical team. Visit consisted of counseling and education dealing with the complex and emotionally intense issues of symptom management and palliative care in the setting of serious and potentially life-threatening illness  I spent 45 minutes providing this consultation,  from 11:00am to 11:45am More than 50% of the time in this consultation was spent coordinating communication.   HISTORY OF PRESENT ILLNESS:  Chad [name] is a 58 y.o. year old male with multiple medical problems includinglateonset CVA, urothelial cancer, prostate cancer, chronic hepatitis C, hyperlipidemia, colon surgery,  protein calorie malnutrition, anxiety, depression. [name] continues to reside at [place] [facility]. He does transfer to the wheelchair. He is independent with ADLs, toileting. He feeds himself and appetite has continued to improve. He does go to the cancer center for continuing chemotherapy with Keytruda. He is also followed at the [facility] by palliative care in addition to palliative care at [facility]. Last primary provider Note 8 / 25 / 2020 for routine visit. No changes made to plan of care. He does continue to go out to the smoking patio daily. It declined smoking cessation. He is followed by wound care at the facility with last date of service 9 / 7 / 2020 for multiple wounds right leg Venus 2 cm x 1.2 cm x 0.2 cm; left leg Venus; left mal medial 5 cm x 3 cm x 0.2 CM Venus; right mal medial 3 cm x 1.8 cm x 0.2 CM Venus. He does have a follow-up and referral to vascular surgery for left lower  extremity ulcer. He is also followed by Dentistry at the facility. At present [name] is sitting in the wheelchair in his room. He appears comfortable. No visitors present. I visited and observe [name]. We talked about purpose of palliative care visit and he was in agreement. We talked about how he is feeling good today. He verbalizes that he's doing well. He is tolerating the [place] treatments with minimal side effects. He does have intermittent pain that he experiences but it is relieved with current pain regiment. His appetite has improved. He does continue to go out to smoke and we talked about smoking sensation for which he declined. We talked about resigning and [place] with challenges of loss of [place] and currently no visitors with covet pandemic. We talked about his next treatment coming up. We  talked about medical goals of care to treat what is treatable though he does remain a DNR.  We talked about role of palliative care and plan of care. Discuss that will follow up in 1 month if needed or sooner should he declined. [name] in agreement. No changes to goals of Care at present time. I updated nursing staff.  9 / 1 / 2,020 covid-19 negative  Palliative Care was asked to help to continue to address goals of care.   CODE STATUS: DNR  PPS: 50% HOSPICE ELIGIBILITY/DIAGNOSIS: TBD  PAST MEDICAL HISTORY:  Past Medical History:  Diagnosis Date  . Anemia   . Anxiety   . ARF (acute respiratory failure) ([place])   . Bladder cancer ([employer])   . Cancer [facility])    prostate  . COPD (chronic obstructive pulmonary disease) ([place])   . Depression   . Dysphagia   . Family history of colon cancer   . Family history of kidney cancer   . Family history of leukemia   . Family history of prostate cancer   . GERD (gastroesophageal reflux disease)   . Hepatitis    chronic hep c  . Hydronephrosis   . Hyperlipidemia   . Knee pain    Left  . Malignant neoplasm of colon ([place])   . Nerve pain   . Peripheral vascular disease ([place])   . Stroke ([place])   . Urinary frequency     SOCIAL HX:  Social History   Tobacco Use  . Smoking status: Current Every Day Smoker    Packs/day: 1.00    Types: Cigarettes  . Smokeless tobacco: Never Used  Substance Use Topics  . Alcohol use: Not Currently    Frequency: Never    ALLERGIES:  Allergies  Allergen Reactions  . Penicillins Rash     PERTINENT MEDICATIONS:  Outpatient Encounter Medications as of 04/08/2019  Medication Sig  . acetaminophen (TYLENOL) 325 MG tablet Take 650 mg by mouth 3 (three) times daily.  . cyclobenzaprine (FLEXERIL) 10 MG tablet Take 10 mg by mouth at bedtime.  . diclofenac sodium (VOLTAREN) 1 % GEL Apply 2 g topically See admin instructions. Apply 2 g topically 4 times daily. Apply 2 g to left shoulder, apply 2 g to left hip and 2 g to left knee 4 times daily per [facility] instructions  . escitalopram (LEXAPRO) 5 MG tablet  Take 5 mg by mouth daily.  Marland Kitchen gabapentin (NEURONTIN) 100 MG capsule Take 100 mg by mouth 3 (three) times daily.  . mirtazapine (REMERON) 7.5 MG tablet Take 7.5 mg by mouth at bedtime.  Marland Kitchen oxycodone (OXY-IR) 5 MG capsule Take 10 mg by mouth every 6 (six) hours as needed.  . pantoprazole (PROTONIX) 40 MG tablet Take 40 mg by mouth daily.   No facility-administered encounter medications on file as of 04/08/2019.     PHYSICAL EXAM:   General: NAD, thin, pleasant male Cardiovascular: regular rate and rhythm Pulmonary: clear ant fields Extremities: +BLE edema, no joint deformities Neurological: Weakness but otherwise nonfocal  Oryn Casanova Ihor Gully, NP

## 2019-04-11 ENCOUNTER — Other Ambulatory Visit: Payer: Self-pay

## 2019-04-12 ENCOUNTER — Telehealth: Payer: Self-pay | Admitting: Licensed Clinical Social Worker

## 2019-04-14 ENCOUNTER — Ambulatory Visit
Admission: RE | Admit: 2019-04-14 | Discharge: 2019-04-14 | Disposition: A | Discharge status: Home / Self Care | Payer: Medicaid Other | Source: Ambulatory Visit | Attending: Internal Medicine | Admitting: Internal Medicine

## 2019-04-14 ENCOUNTER — Other Ambulatory Visit: Payer: Self-pay

## 2019-04-14 DIAGNOSIS — C661 Malignant neoplasm of right ureter: Secondary | ICD-10-CM | POA: Insufficient documentation

## 2019-04-14 MED ORDER — IOHEXOL 300 MG/ML  SOLN
100.0000 mL | Freq: Once | INTRAMUSCULAR | Status: AC | PRN
  Administered 2019-04-14: 10:00:00 100 mL via INTRAVENOUS

## 2019-04-15 ENCOUNTER — Encounter (INDEPENDENT_AMBULATORY_CARE_PROVIDER_SITE_OTHER): Payer: Self-pay | Admitting: Vascular Surgery

## 2019-04-15 ENCOUNTER — Other Ambulatory Visit: Payer: Self-pay

## 2019-04-15 ENCOUNTER — Ambulatory Visit (INDEPENDENT_AMBULATORY_CARE_PROVIDER_SITE_OTHER): Payer: Medicaid Other | Admitting: Vascular Surgery

## 2019-04-15 VITALS — BP 111/80 | HR 86 | Resp 12 | Ht 67.0 in | Wt 148.0 lb

## 2019-04-15 DIAGNOSIS — E43 Unspecified severe protein-calorie malnutrition: Secondary | ICD-10-CM

## 2019-04-15 DIAGNOSIS — C689 Malignant neoplasm of urinary organ, unspecified: Secondary | ICD-10-CM | POA: Diagnosis not present

## 2019-04-15 DIAGNOSIS — L97909 Non-pressure chronic ulcer of unspecified part of unspecified lower leg with unspecified severity: Secondary | ICD-10-CM

## 2019-04-15 DIAGNOSIS — I739 Peripheral vascular disease, unspecified: Secondary | ICD-10-CM

## 2019-04-15 DIAGNOSIS — M7989 Other specified soft tissue disorders: Secondary | ICD-10-CM | POA: Insufficient documentation

## 2019-04-15 NOTE — Assessment & Plan Note (Signed)
Some sort of compression and potentially Unna boots will be helpful in trying to keep the swelling under control to help his wounds heal.  We will not put him in Unna boots until we do a more detailed arterial assessment.  Elevation of his legs would certainly be of benefit as well.

## 2019-04-15 NOTE — Assessment & Plan Note (Signed)
The patient has multiple ulcerations on the lower extremities that are clearly dangerous in limb threatening situations.  Even though he says he has had outpatient assessment at his facility and told his flow was normal, I would be highly suspicious that peripheral arterial disease is playing a contributing factor.  We will obtain a little more detailed noninvasive studies in the near future at his convenience.  We are doing a venous work-up as well as the ankle ulcerations may be mixed arterial and venous lesions.  I suspect an angiogram with intervention will be necessary for full evaluation and trying to improve his perfusion to both lower extremities.  We will see him back in the near future after his noninvasive studies.  We did discuss with the patient that this was a critical and limb threatening situation.

## 2019-04-15 NOTE — Assessment & Plan Note (Signed)
Poor nutrition impairs wound healing tremendously.  Increasing protein and caloric intake will be of benefit.

## 2019-04-15 NOTE — Patient Instructions (Signed)
Peripheral Vascular Disease  Peripheral vascular disease (PVD) is a disease of the blood vessels that are not part of your heart and brain. A simple term for PVD is poor circulation. In most cases, PVD narrows the blood vessels that carry blood from your heart to the rest of your body. This can reduce the supply of blood to your arms, legs, and internal organs, like your stomach or kidneys. However, PVD most often affects a person's lower legs and feet. Without treatment, PVD tends to get worse. PVD can also lead to acute ischemic limb. This is when an arm or leg suddenly cannot get enough blood. This is a medical emergency. Follow these instructions at home: Lifestyle  Do not use any products that contain nicotine or tobacco, such as cigarettes and e-cigarettes. If you need help quitting, ask your doctor.  Lose weight if you are overweight. Or, stay at a healthy weight as told by your doctor.  Eat a diet that is low in fat and cholesterol. If you need help, ask your doctor.  Exercise regularly. Ask your doctor for activities that are right for you. General instructions  Take over-the-counter and prescription medicines only as told by your doctor.  Take good care of your feet: ? Wear comfortable shoes that fit well. ? Check your feet often for any cuts or sores.  Keep all follow-up visits as told by your doctor This is important. Contact a doctor if:  You have cramps in your legs when you walk.  You have leg pain when you are at rest.  You have coldness in a leg or foot.  Your skin changes.  You are unable to get or have an erection (erectile dysfunction).  You have cuts or sores on your feet that do not heal. Get help right away if:  Your arm or leg turns cold, numb, and blue.  Your arms or legs become red, warm, swollen, painful, or numb.  You have chest pain.  You have trouble breathing.  You suddenly have weakness in your face, arm, or leg.  You become very  confused or you cannot speak.  You suddenly have a very bad headache.  You suddenly cannot see. Summary  Peripheral vascular disease (PVD) is a disease of the blood vessels.  A simple term for PVD is poor circulation. Without treatment, PVD tends to get worse.  Treatment may include exercise, low fat and low cholesterol diet, and quitting smoking. This information is not intended to replace advice given to you by your health care provider. Make sure you discuss any questions you have with your health care provider. Document Released: 10/08/2009 Document Revised: 06/26/2017 Document Reviewed: 08/21/2016 Elsevier Patient Education  2020 [employer]  

## 2019-04-15 NOTE — Progress Notes (Signed)
Patient ID: Chad [name], male   DOB: 07-Jun-1961, 58 y.o.   MRN: [id number]  Chief Complaint  Patient presents with  . New Patient (Initial Visit)    HPI Chad [name] is a 58 y.o. male.  I am asked to see the patient by Dr. [name] for evaluation of BLE non-healing ulcerations.  I actually saw him earlier this year and did a Port-A-Cath on him at the hospital but had not seen him for his wounds on his legs.  The wounds have been present now for several weeks.  He says they are improving.  The right lower extremity has wounds on the medial and lateral malleolus of the ankle as well as on the top of the right second toe.  The left medial ankle has the largest wound measuring about 10 cm by about 5 cm.  All the wounds have decent granulation tissue.  They are painful.  The patient denies fever or chills.  He has not really walking much now so it is difficult to assess for claudication.  There is no trauma or injury that started the wounds, but he has had leg swelling and he is undergoing treatment for bladder cancer.  He has a Scientist, product/process development of medical issues.  He says that they have brought out test to his facility and told him his circulation was okay.  He has not had any previous surgery or stents to the lower extremity arteries to his knowledge.  Given the slowly healing wounds and concern for significant vascular disease in a patient with multiple atherosclerotic risk factors and leg swelling, he is referred for further evaluation and treatment.     Past Medical History:  Diagnosis Date  . Anemia   . Anxiety   . ARF (acute respiratory failure) ([place])   . Bladder cancer ([place])   . Cancer [facility])    prostate  . COPD (chronic obstructive pulmonary disease) ([place])   . Depression   . Dysphagia   . Family history of colon cancer   . Family history of kidney cancer   . Family history of leukemia   . Family history of prostate cancer   . GERD (gastroesophageal reflux disease)   . Hepatitis    chronic hep c  . Hydronephrosis   . Hyperlipidemia   . Knee pain    Left  . Malignant neoplasm of colon ([place])   . Nerve pain   . Peripheral vascular disease ([place])   . Stroke ([place])   . Urinary frequency     Past Surgical History:  Procedure Laterality Date  . COLON SURGERY     En bloc extended right hemicolectomy 07/2017  . CYSTOSCOPY W/ RETROGRADES Right 08/30/2018   Procedure: CYSTOSCOPY WITH RETROGRADE PYELOGRAM;  Surgeon: Hollice Espy, MD;  Location: [facility] OR[facility];  Service: Urology;  Laterality: Right;  . CYSTOSCOPY WITH STENT PLACEMENT Right 04/25/2018   Procedure: CYSTOSCOPY WITH STENT PLACEMENT;  Surgeon: Hollice Espy, MD;  Location: [facility] OR[facility];  Service: Urology;  Laterality: Right;  . CYSTOSCOPY WITH STENT PLACEMENT Right 08/30/2018   Procedure: [place] WITH STENT Exchange;  Surgeon: Hollice Espy, MD;  Location: [facility];  Service: Urology;  Laterality: Right;  . CYSTOSCOPY WITH STENT PLACEMENT Right 03/07/2019   Procedure: CYSTOSCOPY WITH STENT Exchange;  Surgeon: Hollice Espy, MD;  Location: [facility] OR[facility];  Service: Urology;  Laterality: Right;  . PORTA CATH INSERTION N/A 02/28/2019   Procedure: PORTA CATH INSERTION;  Surgeon: Algernon Huxley, MD;  Location: [place] [facility];  Service: Cardiovascular;  Laterality: N/A;  . tumor removed       Family History Family History  Problem Relation Age of Onset  . Prostate cancer Father 42  . Cancer Brother 67       unsure type  . Cancer Paternal Uncle        unsure type  . Cancer Maternal Grandmother        unsure type  . Cancer Paternal Grandmother        unsure type  . Kidney cancer Paternal Grandfather   . Cancer Other        unsure types  . Leukemia Son   . Cancer Son        other cancers, possibly colon     Social History Social History   Tobacco Use  . Smoking status: Current Every Day Smoker    Packs/day: 1.00    Types: Cigarettes  . Smokeless tobacco: Never Used  Substance Use Topics  . Alcohol use: Not  Currently    Frequency: Never  . Drug use: Not Currently     Allergies  Allergen Reactions  . Penicillins Rash    Current Outpatient Medications  Medication Sig Dispense Refill  . acetaminophen (TYLENOL) 325 MG tablet Take 650 mg by mouth 3 (three) times daily.    . cyclobenzaprine (FLEXERIL) 10 MG tablet Take 10 mg by mouth at bedtime.    . diclofenac sodium (VOLTAREN) 1 % GEL Apply 2 g topically See admin instructions. Apply 2 g topically 4 times daily. Apply 2 g to left shoulder, apply 2 g to left hip and 2 g to left knee 4 times daily per [facility] instructions    . escitalopram (LEXAPRO) 5 MG tablet Take 5 mg by mouth daily.    Marland Kitchen gabapentin (NEURONTIN) 100 MG capsule Take 100 mg by mouth 3 (three) times daily.    . mirtazapine (REMERON) 7.5 MG tablet Take 7.5 mg by mouth at bedtime.    Marland Kitchen oxycodone (OXY-IR) 5 MG capsule Take 10 mg by mouth every 6 (six) hours as needed.     No current facility-administered medications for this visit.       REVIEW OF SYSTEMS (Negative unless checked)  Constitutional: [x] Weight loss  [] Fever  [] Chills Cardiac: [] Chest pain   [] Chest pressure   [] Palpitations   [] Shortness of breath when laying flat   [] Shortness of breath at rest   [x] Shortness of breath with exertion. Vascular:  [] Pain in legs with walking   [] Pain in legs at rest   [] Pain in legs when laying flat   [] Claudication   [] Pain in feet when walking  [] Pain in feet at rest  [] Pain in feet when laying flat   [] History of DVT   [] Phlebitis   [] Swelling in legs   [] Varicose veins   [] Non-healing ulcers Pulmonary:   [] Uses home oxygen   [] Productive cough   [] Hemoptysis   [] Wheeze  [x] COPD   [] Asthma Neurologic:  [x] Dizziness  [] Blackouts   [] Seizures   [x] History of stroke   [] History of TIA  [] Aphasia   [] Temporary blindness   [] Dysphagia   [] Weakness or numbness in arms   [] Weakness or numbness in legs Musculoskeletal:  [x] Arthritis   [] Joint swelling   [x] Joint pain   [] Low back pain  Hematologic:  [] Easy bruising  [] Easy bleeding   [] Hypercoagulable state   [] Anemic  [] Hepatitis Gastrointestinal:  [] Blood in stool   [] Vomiting blood  [] Gastroesophageal reflux/heartburn   [] Abdominal pain Genitourinary:  [x] Chronic kidney  disease   [x] Difficult urination  [] Frequent urination  [] Burning with urination   [] Hematuria Skin:  [] Rashes   [x] Ulcers   [x] Wounds Psychological:  [] History of anxiety   []  History of major depression.    Physical Exam BP 111/80 (BP Location: Left Arm, Patient Position: Sitting, Cuff Size: Normal)   Pulse 86   Resp 12   Ht 5\' 7"  (1.702 m)   Wt 148 lb (67.1 kg)   BMI 23.18 kg/m  Gen:  WD/WN, NAD Head: [place]/AT, + temporalis wasting.  Ear/Nose/Throat: Hearing grossly intact, nares w/o erythema or drainage, oropharynx w/o Erythema/Exudate Eyes: Conjunctiva clear, sclera non-icteric  Neck: trachea midline.  No JVD.  Pulmonary:  Good air movement, respirations not labored, no use of accessory muscles  Cardiac: RRR, no JVD Vascular:  Vessel Right Left  Radial Palpable Palpable                          DP 1+ 2+  PT NP NP   Gastrointestinal:. No masses, surgical incisions, or scars. Musculoskeletal: M/S 5/5 throughout.  In a wheelchair.  Right foot has ulceration on the medial ankle, lateral ankle, and right second toe.  Left lower extremity has the largest ulceration on the medial ankle.  Fair granulation tissue and no purulent drainage.  No deformity or atrophy. 1+ BLE edema. Neurologic: Sensation grossly intact in extremities.  Symmetrical.  Speech is fluent. Motor exam as listed above. Psychiatric: Judgment intact, Mood & affect appropriate for pt's clinical situation. 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 On immunotherapy. History of colon cancer. Anemia. EXAM: CT  CHEST, ABDOMEN, AND PELVIS WITH CONTRAST TECHNIQUE: Multidetector CT imaging of the chest, abdomen and pelvis was performed following the standard protocol during bolus administration of intravenous contrast. CONTRAST:  11mL OMNIPAQUE IOHEXOL 300 MG/ML  SOLN COMPARISON:  12/10/2018 FINDINGS: CT CHEST FINDINGS Cardiovascular: Right Port-A-Cath tip at high right atrium. Normal heart size, without pericardial effusion. Multivessel coronary artery atherosclerosis. No central pulmonary embolism, on this non-dedicated study. Mediastinum/Nodes: No supraclavicular adenopathy. Prominent but not pathologically enlarged bilateral axillary nodes maintain their fatty hila and are likely reactive. Subcarinal node of 9 mm on 26/2, similar to on the prior (when remeasured). Not pathologic by size criteria. No hilar adenopathy. Lungs/Pleura: No pleural fluid. Scattered tiny subpleural right upper lobe pulmonary nodules are likely subpleural lymph nodes and/or calcified granulomas and are not significantly changed. No suspicious pulmonary nodules. Musculoskeletal: No acute osseous abnormality. CT ABDOMEN PELVIS FINDINGS Hepatobiliary: Caudate and lateral segment left liver lobe enlargement with subtle irregular hepatic capsule. No focal liver lesion. Normal gallbladder, without biliary ductal dilatation. Pancreas: Normal, without mass or ductal dilatation. Spleen: Normal in size, without focal abnormality. Adrenals/Urinary Tract: Normal adrenal glands. Interpolar left renal low-density lesion of 1.3 cm is similar in size to on the prior, measuring slightly greater than fluid density on 73/2. Lower pole 4 mm right renal collecting system stone. Right renal cortical thinning. Ureteric stent unchanged in position. Right-sided caliectasis and decreased contrast excretion from the right kidney. Redemonstration of wall thickening and mild enhancement in the right renal pelvis and ureteropelvic junction. No bladder lesion. Stomach/Bowel:  Normal stomach, without wall thickening. Colonic stool burden suggests constipation. Previously described probable epiploic appendagitis has resolved adjacent to the sigmoid. Right hemicolectomy. Normal small bowel. Vascular/Lymphatic: Aortic and branch  vessel atherosclerosis. Portal caval 10 mm node on 58/2 is similar. Not pathologic by size criteria. Right common iliac node measures 8 mm on 92/2 and is similar to on the prior (when remeasured). Right external iliac node measures 1.2 cm on 103/2 versus 1.0 cm on the prior exam (when remeasured). Index right inguinal node measures 9 mm on 110/2 and is similar (when remeasured). Index left external iliac node measures 1.2 cm on 105/2 and is similar to on the prior (when remeasured). Index left inguinal node measures 1.5 cm on 127/2 and is similar to on the prior exam (when remeasured). Reproductive: Mild prostatomegaly. Other: No significant free fluid. Mild anterior abdominal wall laxity containing fat. Musculoskeletal: No acute osseous abnormality. IMPRESSION: 1. Similar appearance of the right kidney, with urothelial thickening/hyperenhancement within the renal pelvis and region of the ureteropelvic junction. Similar secondary mild caliectasis and decreased right renal contrast excretion. Right ureteral stent remains in place. 2. Similar pelvic adenopathy. 3. No typical findings of metastatic disease in the chest. 4. Coronary artery atherosclerosis. Aortic Atherosclerosis (ICD10-I70.0). 5. Hepatic morphology which is suspicious for mild cirrhosis. Correlate with risk factors. 6. Right nephrolithiasis. 7. Left renal lesion is likely a minimally complex cyst and is unchanged in size. Electronically Signed   By: Abigail Miyamoto M.D.   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 On immunotherapy. History of colon cancer. Anemia. EXAM: CT CHEST,  ABDOMEN, AND PELVIS WITH CONTRAST TECHNIQUE: Multidetector CT imaging of the chest, abdomen and pelvis was performed following the standard protocol during bolus administration of intravenous contrast. CONTRAST:  181mL OMNIPAQUE IOHEXOL 300 MG/ML  SOLN COMPARISON:  12/10/2018 FINDINGS: CT CHEST FINDINGS Cardiovascular: Right Port-A-Cath tip at high right atrium. Normal heart size, without pericardial effusion. Multivessel coronary artery atherosclerosis. No central pulmonary embolism, on this non-dedicated study. Mediastinum/Nodes: No supraclavicular adenopathy. Prominent but not pathologically enlarged bilateral axillary nodes maintain their fatty hila and are likely reactive. Subcarinal node of 9 mm on 26/2, similar to on the prior (when remeasured). Not pathologic by size criteria. No hilar adenopathy. Lungs/Pleura: No pleural fluid. Scattered tiny subpleural right upper lobe pulmonary nodules are likely subpleural lymph nodes and/or calcified granulomas and are not significantly changed. No suspicious pulmonary nodules. Musculoskeletal: No acute osseous abnormality. CT ABDOMEN PELVIS FINDINGS Hepatobiliary: Caudate and lateral segment left liver lobe enlargement with subtle irregular hepatic capsule. No focal liver lesion. Normal gallbladder, without biliary ductal dilatation. Pancreas: Normal, without mass or ductal dilatation. Spleen: Normal in size, without focal abnormality. Adrenals/Urinary Tract: Normal adrenal glands. Interpolar left renal low-density lesion of 1.3 cm is similar in size to on the prior, measuring slightly greater than fluid density on 73/2. Lower pole 4 mm right renal collecting system stone. Right renal cortical thinning. Ureteric stent unchanged in position. Right-sided caliectasis and decreased contrast excretion from the right kidney. Redemonstration of wall thickening and mild enhancement in the right renal pelvis and ureteropelvic junction. No bladder lesion. Stomach/Bowel: Normal  stomach, without wall thickening. Colonic stool burden suggests constipation. Previously described probable epiploic appendagitis has resolved adjacent to the sigmoid. Right hemicolectomy. Normal small bowel. Vascular/Lymphatic: Aortic and branch vessel atherosclerosis. Portal caval 10 mm node on 58/2 is similar. Not pathologic by size criteria. Right common iliac node measures 8 mm on 92/2 and is similar to on the prior (when remeasured). Right external iliac node measures 1.2 cm on 103/2 versus 1.0  cm on the prior exam (when remeasured). Index right inguinal node measures 9 mm on 110/2 and is similar (when remeasured). Index left external iliac node measures 1.2 cm on 105/2 and is similar to on the prior (when remeasured). Index left inguinal node measures 1.5 cm on 127/2 and is similar to on the prior exam (when remeasured). Reproductive: Mild prostatomegaly. Other: No significant free fluid. Mild anterior abdominal wall laxity containing fat. Musculoskeletal: No acute osseous abnormality. IMPRESSION: 1. Similar appearance of the right kidney, with urothelial thickening/hyperenhancement within the renal pelvis and region of the ureteropelvic junction. Similar secondary mild caliectasis and decreased right renal contrast excretion. Right ureteral stent remains in place. 2. Similar pelvic adenopathy. 3. No typical findings of metastatic disease in the chest. 4. Coronary artery atherosclerosis. Aortic Atherosclerosis (ICD10-I70.0). 5. Hepatic morphology which is suspicious for mild cirrhosis. Correlate with risk factors. 6. Right nephrolithiasis. 7. Left renal lesion is likely a minimally complex cyst and is unchanged in size. Electronically Signed   By: Abigail Miyamoto M.D.   On: 04/14/2019 12:25    Labs Recent Results (from the past 2160 hour(s))  Comprehensive metabolic panel     Status: Abnormal   Collection Time: 01/24/19  8:15 AM  Result Value Ref Range   Sodium 138 135 - 145 mmol/L   Potassium 4.2 3.5 -  5.1 mmol/L   Chloride 105 98 - 111 mmol/L   CO2 24 22 - 32 mmol/L   Glucose, Bld 101 (H) 70 - 99 mg/dL   BUN 14 6 - 20 mg/dL   Creatinine, Ser 0.93 0.61 - 1.24 mg/dL   Calcium 9.0 8.9 - 10.3 mg/dL   Total Protein 8.4 (H) 6.5 - 8.1 g/dL   Albumin 3.9 3.5 - 5.0 g/dL   AST 19 15 - 41 U/L   ALT 9 0 - 44 U/L   Alkaline Phosphatase 87 38 - 126 U/L   Total Bilirubin 0.6 0.3 - 1.2 mg/dL   GFR calc non Af Amer >60 >60 mL/min   GFR calc Af Amer >60 >60 mL/min   Anion gap 9 5 - 15    Comment: Performed at [facility], [address] [place], [place] [address]  CBC with Differential     Status: Abnormal   Collection Time: 01/24/19  8:15 AM  Result Value Ref Range   WBC 7.0 4.0 - 10.5 K/uL   RBC 4.61 4.22 - 5.81 MIL/uL   Hemoglobin 12.0 (L) 13.0 - 17.0 g/dL   HCT 37.1 (L) 39.0 - 52.0 %   MCV 80.5 80.0 - 100.0 fL   MCH 26.0 26.0 - 34.0 pg   MCHC 32.3 30.0 - 36.0 g/dL   RDW 13.5 11.5 - 15.5 %   Platelets 210 150 - 400 K/uL   nRBC 0.0 0.0 - 0.2 %   Neutrophils Relative % 71 %   Neutro Abs 4.9 1.7 - 7.7 K/uL   Lymphocytes Relative 16 %   Lymphs Abs 1.1 0.7 - 4.0 K/uL   Monocytes Relative 8 %   Monocytes Absolute 0.5 0.1 - 1.0 K/uL   Eosinophils Relative 4 %   Eosinophils Absolute 0.3 0.0 - 0.5 K/uL   Basophils Relative 1 %   Basophils Absolute 0.1 0.0 - 0.1 K/uL   Immature Granulocytes 0 %   Abs Immature Granulocytes 0.01 0.00 - 0.07 K/uL    Comment: Performed at [facility], [address], [place], [place] [address]  TSH     Status: None  Collection Time: 01/24/19  8:18 AM  Result Value Ref Range   TSH 1.810 0.350 - 4.500 uIU/mL    Comment: Performed by a 3rd Generation assay with a functional sensitivity of <=0.01 uIU/mL. Performed at [name], [address] [place], [place] [address]   Thyroid Panel With TSH     Status: None   Collection Time: 02/14/19  8:33 AM  Result Value Ref Range   TSH 3.900 0.450 - 4.500 uIU/mL   T4, Total 6.8 4.5 -  12.0 ug/dL   T3 Uptake Ratio 28 24 - 39 %   Free Thyroxine Index 1.9 1.2 - 4.9    Comment: (NOTE) Performed At: [facility] [place], [place] [id number] Rush [name] MD [id number]   Comprehensive metabolic panel     Status: Abnormal   Collection Time: 02/14/19  8:33 AM  Result Value Ref Range   Sodium 137 135 - 145 mmol/L   Potassium 4.0 3.5 - 5.1 mmol/L   Chloride 103 98 - 111 mmol/L   CO2 26 22 - 32 mmol/L   Glucose, Bld 101 (H) 70 - 99 mg/dL   BUN 20 6 - 20 mg/dL   Creatinine, Ser 0.92 0.61 - 1.24 mg/dL   Calcium 9.0 8.9 - 10.3 mg/dL   Total Protein 8.1 6.5 - 8.1 g/dL   Albumin 3.6 3.5 - 5.0 g/dL   AST 30 15 - 41 U/L   ALT 23 0 - 44 U/L   Alkaline Phosphatase 89 38 - 126 U/L   Total Bilirubin 0.5 0.3 - 1.2 mg/dL   GFR calc non Af Amer >60 >60 mL/min   GFR calc Af Amer >60 >60 mL/min   Anion gap 8 5 - 15    Comment: Performed at [facility], [address] [place], [place] [address]  CBC with Differential     Status: Abnormal   Collection Time: 02/14/19  8:33 AM  Result Value Ref Range   WBC 6.2 4.0 - 10.5 K/uL   RBC 4.92 4.22 - 5.81 MIL/uL   Hemoglobin 12.7 (L) 13.0 - 17.0 g/dL   HCT 39.9 39.0 - 52.0 %   MCV 81.1 80.0 - 100.0 fL   MCH 25.8 (L) 26.0 - 34.0 pg   MCHC 31.8 30.0 - 36.0 g/dL   RDW 14.3 11.5 - 15.5 %   Platelets 192 150 - 400 K/uL   nRBC 0.0 0.0 - 0.2 %   Neutrophils Relative % 64 %   Neutro Abs 3.9 1.7 - 7.7 K/uL   Lymphocytes Relative 19 %   Lymphs Abs 1.2 0.7 - 4.0 K/uL   Monocytes Relative 9 %   Monocytes Absolute 0.5 0.1 - 1.0 K/uL   Eosinophils Relative 7 %   Eosinophils Absolute 0.4 0.0 - 0.5 K/uL   Basophils Relative 1 %   Basophils Absolute 0.1 0.0 - 0.1 K/uL   Immature Granulocytes 0 %   Abs Immature Granulocytes 0.02 0.00 - 0.07 K/uL    Comment: Performed at [facility], [address] [place], [place] [address]  SARS Coronavirus 2 [facility] order, Performed in [place] hospital lab)      Status: None   Collection Time: 02/28/19  8:20 AM  Result Value Ref Range   SARS Coronavirus 2 NEGATIVE NEGATIVE    Comment: (NOTE) If result is NEGATIVE SARS-CoV-2 target nucleic acids are NOT DETECTED. The SARS-CoV-2 RNA is generally detectable in upper and lower  respiratory specimens during the acute phase  of infection. The lowest  concentration of SARS-CoV-2 viral copies this assay can detect is 250  copies / mL. A negative result does not preclude SARS-CoV-2 infection  and should not be used as the sole basis for treatment or other  patient management decisions.  A negative result may occur with  improper specimen collection / handling, submission of specimen other  than nasopharyngeal swab, presence of viral mutation(s) within the  areas targeted by this assay, and inadequate number of viral copies  (<250 copies / mL). A negative result must be combined with clinical  observations, patient history, and epidemiological information. If result is POSITIVE SARS-CoV-2 target nucleic acids are DETECTED. The SARS-CoV-2 RNA is generally detectable in upper and lower  respiratory specimens dur ing the acute phase of infection.  Positive  results are indicative of active infection with SARS-CoV-2.  Clinical  correlation with patient history and other diagnostic information is  necessary to determine patient infection status.  Positive results do  not rule out bacterial infection or co-infection with other viruses. If result is PRESUMPTIVE POSTIVE SARS-CoV-2 nucleic acids MAY BE PRESENT.   A presumptive positive result was obtained on the submitted specimen  and confirmed on repeat testing.  While 2019 novel coronavirus  (SARS-CoV-2) nucleic acids may be present in the submitted sample  additional confirmatory testing may be necessary for epidemiological  and / or clinical management purposes  to differentiate between  SARS-CoV-2 and other Sarbecovirus currently known to infect humans.   If clinically indicated additional testing with an alternate test  methodology [phone]) is advised. The SARS-CoV-2 RNA is generally  detectable in upper and lower respiratory sp ecimens during the acute  phase of infection. The expected result is Negative. Fact Sheet for Patients:  StrictlyIdeas.no Fact Sheet for Healthcare Providers: BankingDealers.co.za This test is not yet approved or cleared by the [place] FDA and has been authorized for detection and/or diagnosis of SARS-CoV-2 by FDA under an Emergency Use Authorization (EUA).  This EUA will remain in effect (meaning this test can be used) for the duration of the COVID-19 declaration under Section 564(b)(1) of the Act, 21 U.S.C. section 360bbb-3(b)(1), unless the authorization is terminated or revoked sooner. Performed at [facility], [address] [place], [place] [address]   Urinalysis, Complete     Status: Abnormal   Collection Time: 03/01/19  2:00 PM  Result Value Ref Range   Specific Gravity, UA 1.020 1.005 - 1.030   pH, UA 6.5 5.0 - 7.5   Color, UA Yellow Yellow   Appearance Ur Cloudy (A) Clear   Leukocytes,UA 1+ (A) Negative   Protein,UA 1+ (A) Negative/Trace   Glucose, UA Negative Negative   Ketones, UA Negative Negative   RBC, UA 2+ (A) Negative   Bilirubin, UA Negative Negative   Urobilinogen, Ur 2.0 (H) 0.2 - 1.0 mg/dL   Nitrite, UA Negative Negative   Microscopic Examination See below:   CULTURE, URINE COMPREHENSIVE     Status: None   Collection Time: 03/01/19  2:00 PM   Specimen: Urine   UR  Result Value Ref Range   Urine Culture, Comprehensive Final report    Organism ID, Bacteria Comment     Comment: Mixed urogenital flora 25,000-50,000 colony forming units per mL   Microscopic Examination     Status: Abnormal   Collection Time: 03/01/19  2:00 PM   URINE  Result Value Ref Range   WBC, UA 0-5 0 - 5 /hpf   RBC >30 (  A) 0 - 2 /hpf    Epithelial Cells (non renal) 0-10 0 - 10 /hpf   Bacteria, UA None seen None seen/Few  Urine Drug Screen, Qualitative ([facility] only)     Status: Abnormal   Collection Time: 03/07/19  9:38 AM  Result Value Ref Range   Tricyclic, Ur Screen POSITIVE (A) NONE DETECTED   Amphetamines, Ur Screen NONE DETECTED NONE DETECTED   MDMA (Ecstasy)Ur Screen NONE DETECTED NONE DETECTED   Cocaine Metabolite,Ur [place] NONE DETECTED NONE DETECTED   Opiate, Ur Screen POSITIVE (A) NONE DETECTED   Phencyclidine (PCP) Ur S NONE DETECTED NONE DETECTED   Cannabinoid 50 Ng, Ur [place] NONE DETECTED NONE DETECTED   Barbiturates, Ur Screen NONE DETECTED NONE DETECTED   Benzodiazepine, Ur Scrn NONE DETECTED NONE DETECTED   Methadone Scn, Ur NONE DETECTED NONE DETECTED    Comment: (NOTE) Tricyclics + metabolites, urine    Cutoff 1000 ng/mL Amphetamines + metabolites, urine  Cutoff 1000 ng/mL MDMA (Ecstasy), urine              Cutoff 500 ng/mL Cocaine Metabolite, urine          Cutoff 300 ng/mL Opiate + metabolites, urine        Cutoff 300 ng/mL Phencyclidine (PCP), urine         Cutoff 25 ng/mL Cannabinoid, urine                 Cutoff 50 ng/mL Barbiturates + metabolites, urine  Cutoff 200 ng/mL Benzodiazepine, urine              Cutoff 200 ng/mL Methadone, urine                   Cutoff 300 ng/mL The urine drug screen provides only a preliminary, unconfirmed analytical test result and should not be used for non-medical purposes. Clinical consideration and professional judgment should be applied to any positive drug screen result due to possible interfering substances. A more specific alternate chemical method must be used in order to obtain a confirmed analytical result. Gas chromatography / mass spectrometry (GC/MS) is the preferred confirmat ory method. Performed at [facility], [address] [place], [place] [address]   SARS Coronavirus 2 [facility] order, Performed in [facility] hospital lab)  Nasopharyngeal Nasopharyngeal Swab     Status: None   Collection Time: 03/07/19 10:13 AM   Specimen: Nasopharyngeal Swab  Result Value Ref Range   SARS Coronavirus 2 NEGATIVE NEGATIVE    Comment: (NOTE) If result is NEGATIVE SARS-CoV-2 target nucleic acids are NOT DETECTED. The SARS-CoV-2 RNA is generally detectable in upper and lower  respiratory specimens during the acute phase of infection. The lowest  concentration of SARS-CoV-2 viral copies this assay can detect is 250  copies / mL. A negative result does not preclude SARS-CoV-2 infection  and should not be used as the sole basis for treatment or other  patient management decisions.  A negative result may occur with  improper specimen collection / handling, submission of specimen other  than nasopharyngeal swab, presence of viral mutation(s) within the  areas targeted by this assay, and inadequate number of viral copies  (<250 copies / mL). A negative result must be combined with clinical  observations, patient history, and epidemiological information. If result is POSITIVE SARS-CoV-2 target nucleic acids are DETECTED. The SARS-CoV-2 RNA is generally detectable in upper and lower  respiratory specimens dur ing the acute phase of infection.  Positive  results are indicative  of active infection with SARS-CoV-2.  Clinical  correlation with patient history and other diagnostic information is  necessary to determine patient infection status.  Positive results do  not rule out bacterial infection or co-infection with other viruses. If result is PRESUMPTIVE POSTIVE SARS-CoV-2 nucleic acids MAY BE PRESENT.   A presumptive positive result was obtained on the submitted specimen  and confirmed on repeat testing.  While 2019 novel coronavirus  (SARS-CoV-2) nucleic acids may be present in the submitted sample  additional confirmatory testing may be necessary for epidemiological  and / or clinical management purposes  to differentiate  between  SARS-CoV-2 and other Sarbecovirus currently known to infect humans.  If clinically indicated additional testing with an alternate test  methodology [id number]) is advised. The SARS-CoV-2 RNA is generally  detectable in upper and lower respiratory sp ecimens during the acute  phase of infection. The expected result is Negative. Fact Sheet for Patients:  StrictlyIdeas.no Fact Sheet for Healthcare Providers: BankingDealers.co.za This test is not yet approved or cleared by the [place] FDA and has been authorized for detection and/or diagnosis of SARS-CoV-2 by FDA under an Emergency Use Authorization (EUA).  This EUA will remain in effect (meaning this test can be used) for the duration of the COVID-19 declaration under Section 564(b)(1) of the Act, 21 U.S.C. section 360bbb-3(b)(1), unless the authorization is terminated or revoked sooner. Performed at [facility], [address] [place], [place] [address]   Comprehensive metabolic panel     Status: Abnormal   Collection Time: 03/11/19  9:27 AM  Result Value Ref Range   Sodium 137 135 - 145 mmol/L   Potassium 4.2 3.5 - 5.1 mmol/L   Chloride 104 98 - 111 mmol/L   CO2 26 22 - 32 mmol/L   Glucose, Bld 106 (H) 70 - 99 mg/dL   BUN 20 6 - 20 mg/dL   Creatinine, Ser 0.88 0.61 - 1.24 mg/dL   Calcium 8.9 8.9 - 10.3 mg/dL   Total Protein 7.8 6.5 - 8.1 g/dL   Albumin 3.6 3.5 - 5.0 g/dL   AST 46 (H) 15 - 41 U/L   ALT 45 (H) 0 - 44 U/L   Alkaline Phosphatase 119 38 - 126 U/L   Total Bilirubin 0.5 0.3 - 1.2 mg/dL   GFR calc non Af Amer >60 >60 mL/min   GFR calc Af Amer >60 >60 mL/min   Anion gap 7 5 - 15    Comment: Performed at [facility], [address] [place], [place] [address]  CBC with Differential     Status: Abnormal   Collection Time: 03/11/19  9:27 AM  Result Value Ref Range   WBC 7.8 4.0 - 10.5 K/uL   RBC 4.68 4.22 - 5.81 MIL/uL   Hemoglobin 12.0  (L) 13.0 - 17.0 g/dL   HCT 37.7 (L) 39.0 - 52.0 %   MCV 80.6 80.0 - 100.0 fL   MCH 25.6 (L) 26.0 - 34.0 pg   MCHC 31.8 30.0 - 36.0 g/dL   RDW 14.9 11.5 - 15.5 %   Platelets 173 150 - 400 K/uL   nRBC 0.0 0.0 - 0.2 %   Neutrophils Relative % 74 %   Neutro Abs 5.8 1.7 - 7.7 K/uL   Lymphocytes Relative 12 %   Lymphs Abs 1.0 0.7 - 4.0 K/uL   Monocytes Relative 7 %   Monocytes Absolute 0.6 0.1 - 1.0 K/uL   Eosinophils Relative 6 %   Eosinophils Absolute 0.5  0.0 - 0.5 K/uL   Basophils Relative 1 %   Basophils Absolute 0.1 0.0 - 0.1 K/uL   Immature Granulocytes 0 %   Abs Immature Granulocytes 0.01 0.00 - 0.07 K/uL    Comment: Performed at [facility], [address], [place], [place] [address]    Assessment/Plan:  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  This is a severe and difficult problem and would certainly impact his wound healing  Protein-calorie malnutrition, severe Poor nutrition impairs wound healing tremendously.  Increasing protein and caloric intake will be of benefit.  Swelling of limb Some sort of compression and potentially Unna boots will be helpful in trying to keep the swelling under control to help his wounds heal.  We will not put him in Unna boots until we do a more detailed arterial assessment.  Elevation of his legs would certainly be of benefit as well.  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  Even though he says he has had outpatient assessment at his facility and told his flow was normal, I would be highly suspicious that peripheral arterial disease is playing a contributing factor.  We will obtain a little more detailed noninvasive studies in the near future at his convenience.  We are doing a venous work-up as well as the ankle ulcerations may be mixed arterial and venous  lesions.  I suspect an angiogram with intervention will be necessary for full evaluation and trying to improve his perfusion to both lower extremities.  We will see him back in the near future after his noninvasive studies.  We did discuss with the patient that this was a critical and limb threatening situation.      Leotis Pain 04/15/2019, 11:04 AM   This note was created with Dragon medical transcription system.  Any errors from dictation are unintentional.

## 2019-04-15 NOTE — Assessment & Plan Note (Signed)
Being followed by urology as well as oncology.  This is a severe and difficult problem and would certainly impact his wound healing

## 2019-04-27 ENCOUNTER — Other Ambulatory Visit: Payer: Self-pay

## 2019-04-27 ENCOUNTER — Ambulatory Visit (INDEPENDENT_AMBULATORY_CARE_PROVIDER_SITE_OTHER): Payer: Medicaid Other

## 2019-04-27 ENCOUNTER — Ambulatory Visit (INDEPENDENT_AMBULATORY_CARE_PROVIDER_SITE_OTHER): Payer: Medicaid Other | Admitting: Nurse Practitioner

## 2019-04-27 ENCOUNTER — Encounter (INDEPENDENT_AMBULATORY_CARE_PROVIDER_SITE_OTHER): Payer: Self-pay | Admitting: Nurse Practitioner

## 2019-04-27 VITALS — BP 114/80 | HR 80 | Resp 16 | Wt 148.0 lb

## 2019-04-27 DIAGNOSIS — E43 Unspecified severe protein-calorie malnutrition: Secondary | ICD-10-CM

## 2019-04-27 DIAGNOSIS — M7989 Other specified soft tissue disorders: Secondary | ICD-10-CM

## 2019-04-27 DIAGNOSIS — L97909 Non-pressure chronic ulcer of unspecified part of unspecified lower leg with unspecified severity: Secondary | ICD-10-CM

## 2019-04-27 DIAGNOSIS — I739 Peripheral vascular disease, unspecified: Secondary | ICD-10-CM

## 2019-04-27 NOTE — Progress Notes (Signed)
SUBJECTIVE:  Patient ID: Chad [name], male    DOB: 1960-09-11, 58 y.o.   MRN: [id number] Chief Complaint  Patient presents with   Follow-up    ultrasound follow up    HPI  Chad [name] is a 58 y.o. male that presents today for evaluation due to bilateral lower extremity ulcerations.  The patient reports that these wounds are healing however they are very slow to heal.  The patient has 3 ulcerations on his right lower extremity and one on his left.  These wounds are painful, however there is no purulent drainage but with granulation tissue present.  Patient denies any fever, chills, nausea, vomiting or diarrhea.  Patient denies any claudication-like symptoms however he ambulates very little.  The patient also has some lower extremity edema however today it is well controlled.  The patient denies any other previous interventions on his arteries.  The patient underwent bilateral ABIs today to assess for possible arterial component.  Today the patient's ABIs are 1.17 on his right and 1.02 on the left.  The right lower extremity has biphasic anterior tibial artery waveforms with monophasic posterior tibial artery waveforms.  The left lower extremity has dampened biphasic anterior tibial artery waveforms with dampened monophasic posterior tibial artery waveforms.  The bilateral toe waveforms are dampened.  The patient also underwent a venous reflux study which revealed no evidence of DVT bilaterally.  There was also no evidence of chronic venous insufficiency bilaterally.  The left groin has evidence of enlarged lymph nodes, however this is consistent with wounds as well as bladder cancer.  Past Medical History:  Diagnosis Date   Anemia    Anxiety    ARF (acute respiratory failure) (HCC)    Bladder cancer (HCC)    Cancer (HCC)    prostate   COPD (chronic obstructive pulmonary disease) ([place])    Depression    Dysphagia    Family history of colon cancer    Family history of  kidney cancer    Family history of leukemia    Family history of prostate cancer    GERD (gastroesophageal reflux disease)    Hepatitis    chronic hep c   Hydronephrosis    Hyperlipidemia    Knee pain    Left   Malignant neoplasm of colon ([place])    Nerve pain    Peripheral vascular disease ([place])    Stroke [facility])    Urinary frequency     Past Surgical History:  Procedure Laterality Date   COLON SURGERY     En bloc extended right hemicolectomy 07/2017   CYSTOSCOPY W/ RETROGRADES Right 08/30/2018   Procedure: CYSTOSCOPY WITH RETROGRADE PYELOGRAM;  Surgeon: Hollice Espy, MD;  Location: [facility] ORS;  Service: Urology;  Laterality: Right;   CYSTOSCOPY WITH STENT PLACEMENT Right 04/25/2018   Procedure: CYSTOSCOPY WITH STENT PLACEMENT;  Surgeon: Hollice Espy, MD;  Location: [facility] ORS;  Service: Urology;  Laterality: Right;   CYSTOSCOPY WITH STENT PLACEMENT Right 08/30/2018   Procedure: [place];  Surgeon: Hollice Espy, MD;  Location: [facility] ORS;  Service: Urology;  Laterality: Right;   CYSTOSCOPY WITH STENT PLACEMENT Right 03/07/2019   Procedure: CYSTOSCOPY WITH STENT Exchange;  Surgeon: Hollice Espy, MD;  Location: [facility] ORS;  Service: Urology;  Laterality: Right;   PORTA CATH INSERTION N/A 02/28/2019   Procedure: PORTA CATH INSERTION;  Surgeon: Algernon Huxley, MD;  Location: [facility] CV LAB;  Service: Cardiovascular;  Laterality: N/A;   tumor removed  Social History   Socioeconomic History   Marital status: Single    Spouse name: Not on file   Number of children: Not on file   Years of education: Not on file   Highest education level: Not on file  Occupational History   Not on file  Social Needs   Financial resource strain: Not on file   Food insecurity    Worry: Not on file    Inability: Not on file   Transportation needs    Medical: Not on file    Non-medical: Not on file  Tobacco Use   Smoking status: Current Every  Day Smoker    Packs/day: 1.00    Types: Cigarettes   Smokeless tobacco: Never Used  Substance and Sexual Activity   Alcohol use: Not Currently    Frequency: Never   Drug use: Not Currently   Sexual activity: Not on file  Lifestyle   Physical activity    Days per week: Not on file    Minutes per session: Not on file   Stress: Not on file  Relationships   Social connections    Talks on phone: Not on file    Gets together: Not on file    Attends religious service: Not on file    Active member of club or organization: Not on file    Attends meetings of clubs or organizations: Not on file    Relationship status: Not on file   Intimate partner violence    Fear of current or ex partner: Not on file    Emotionally abused: Not on file    Physically abused: Not on file    Forced sexual activity: Not on file  Other Topics Concern   Not on file  Social History Narrative    used to live [place]; moved  To [place]- end of April 2019; in Nursing home; 1pp/day; quit alcohol. 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        Family- dad- prostate ca [at 41y]; brother- 46 died of prostate cancer; brother- 26- no cancers [[place]]; sonGerald [name] [[place]];Chad [name]-32y prostate ca [facility] [place]]; daughter- 4 [[place]]; another daughter 54 [[place]/addict]. will refer genetics counseling. Given MSI- abnormal; highly suspicious of Lynch syndrome.  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    Family History  Problem Relation Age of Onset   Prostate cancer Father 64   Cancer Brother 23       unsure type   Cancer Paternal Uncle        unsure type   Cancer Maternal Grandmother        unsure type   Cancer Paternal Grandmother        unsure type   Kidney cancer Paternal Grandfather    Cancer Other        unsure types   Leukemia Son    Cancer Son        other cancers, possibly colon    Allergies  Allergen Reactions   Penicillins Rash     Review of Systems   Review of Systems:  Negative Unless Checked Constitutional: _0 Weight loss  _1 Fever  _2 Chills Cardiac: _3 Chest pain   _4  Atrial Fibrillation  _5 Palpitations   _6 Shortness of breath when laying flat   _7 Shortness of breath with exertion. _8 Shortness of breath at rest Vascular:  _9 Pain in legs with walking   _10 Pain in legs with standing _11 Pain in legs when laying flat   _12 Claudication    _13 Pain in feet when laying  flat    _0 History of DVT   _1 Phlebitis   _2 Swelling in legs   _3 Varicose veins   _4 Non-healing ulcers Pulmonary:   _5 Uses home oxygen   _6 Productive cough   _7 Hemoptysis   _8 Wheeze  _9 COPD   _10 Asthma Neurologic:  _11 Dizziness   _12 Seizures  _13 Blackouts _14 History of stroke   _15 History of TIA  _16 Aphasia   _17 Temporary Blindness   _18 Weakness or numbness in arm   _19 Weakness or numbness in leg Musculoskeletal:   _20 Joint swelling   _21 Joint pain   _22 Low back pain  _23  History of Knee Replacement _24 Arthritis _25 back Surgeries  _26  Spinal Stenosis    Hematologic:  _27 Easy bruising  _28 Easy bleeding   _29 Hypercoagulable state   _30 Anemic Gastrointestinal:  _31 Diarrhea   _32 Vomiting  _33 Gastroesophageal reflux/heartburn   _34 Difficulty swallowing. _35 Abdominal pain Genitourinary:  _36 Chronic kidney disease   _37 Difficult urination  _38 Anuric   _39 Blood in urine _40 Frequent urination  _41 Burning with urination   _42 Hematuria Skin:  _43 Rashes   _44 Ulcers _45 Wounds Psychological:  _46 History of anxiety   _47  History of major depression  _48  Memory Difficulties      OBJECTIVE:   Physical Exam  BP 114/80 (BP Location: Right Arm)    Pulse 80    Resp 16    Wt 148 lb (67.1 kg)    BMI 23.18 kg/m   Gen: WD/WN, NAD Head: [place]/AT, No temporalis wasting.  Ear/Nose/Throat: Hearing grossly intact, nares w/o erythema or drainage Eyes: PER, EOMI, sclera nonicteric.  Neck: Supple, no masses.  No JVD.  Pulmonary:  Good air movement, no use of accessory muscles.  Cardiac: RRR Vascular:  1+ edema bilaterally Vessel Right Left  Radial Palpable Palpable    Dorsalis Pedis Palpable Palpable  Posterior Tibial Not Palpable Not Palpable   Gastrointestinal: soft, non-distended. No guarding/no peritoneal signs.  Musculoskeletal: Wheelchair-bound..  No deformity or atrophy.  Neurologic: Pain and light touch intact in extremities.  Symmetrical.  Speech is fluent. Motor exam as listed above. Psychiatric: Judgment intact, Mood & affect appropriate for pt's clinical situation. Dermatologic:  Right foot ulceration at the medial ankle, lateral ankle and the right second toe.  The left lower extremity has a large ulceration in the medial ankle.  No purulent drainage.  Good granulation tissue. No changes consistent with cellulitis. Lymph : No Cervical lymphadenopathy, no lichenification or skin changes of chronic lymphedema.       ASSESSMENT AND PLAN:  1. 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  While the patient does not have severely altered arterial studies, there is evidence of atherosclerotic changes which may be affecting the patient's ability to heal.  We discussed possible conservative treatment measures versus aggressive measures with the angiogram.  Due to the fact the patient has slow healing ulcers as well as multiple ulcerations it was felt that it was in his best interest to proceed with angiogram to further evaluate any worsening tissue loss.  This represents a limb threatening ischemia and places the patient at the risk for limb loss.  Patient should undergo angiography of the lower extremities with the hope for intervention for limb salvage.  The left should be intervened on first prior to the right.  The risks and benefits as well as the alternative therapies was discussed in detail with the patient.  All questions were answered.  Patient agrees to proceed with  angiography.  The patient will follow up with me in the office after the procedure.    2. Swelling of limb We will continue with elevation of the patient's lower extremities.  Due to the wounds and will make it difficult for the patient utilize compression stockings.  Today the swelling was not severe.  Once wounds are healed we will discuss transition into medical grade 1 compression stockings to be utilized at the patient's facility.  3. Protein-calorie malnutrition, severe Adequate protein will be necessary not only for wound healing but also for controlling fluid balance with swelling.  Facility is advised to continue to encourage protein rich supplements and foods.   Current Outpatient Medications on File Prior to Visit  Medication Sig Dispense Refill   acetaminophen (TYLENOL) 325 MG tablet Take 650 mg by mouth 3 (three) times daily.     cyclobenzaprine (FLEXERIL) 10 MG tablet Take 10 mg by mouth at bedtime.     diclofenac sodium (VOLTAREN) 1 % GEL Apply 2 g topically See admin instructions. Apply 2 g topically 4 times daily. Apply 2 g to left shoulder, apply 2 g to left hip and 2 g to left knee 4 times daily per [facility] instructions     escitalopram (LEXAPRO) 5 MG tablet Take 5 mg by mouth daily.     gabapentin (NEURONTIN) 100 MG capsule Take 100 mg by mouth 3 (three) times daily.     mirtazapine (REMERON) 7.5 MG tablet Take 7.5 mg by mouth at bedtime.     oxycodone (OXY-IR) 5 MG capsule Take 10 mg by mouth every 6 (six) hours as needed.     No current facility-administered medications on file prior to visit.     There are no Patient Instructions on file for this visit. No follow-ups on file.   Kris Hartmann, NP  This note was completed with Sales executive.  Any errors are purely unintentional.

## 2019-04-28 ENCOUNTER — Telehealth (INDEPENDENT_AMBULATORY_CARE_PROVIDER_SITE_OTHER): Payer: Self-pay

## 2019-04-28 ENCOUNTER — Encounter (INDEPENDENT_AMBULATORY_CARE_PROVIDER_SITE_OTHER): Payer: Self-pay

## 2019-04-28 NOTE — Telephone Encounter (Signed)
I attempted to contact Chad [name] at [facility] regarding the patient and the appt for his leg angio. Patient is scheduled with Dr. Lucky Cowboy for his left leg angio on 05/09/2019 with a 10:00 am arrival time to the [facility] for Covid  Testing then he will go to the [facility] at 12:00pm for his procedure. Patient is scheduled for his right leg angio with Dr. Lucky Cowboy on 05/16/2019 with his Covid test at the [facility] at 8:00 am and arrival to the [facility] at 10:00 am for his procedure. Pre-procedure instructions will be faxed to Chad [name] at [facility].

## 2019-04-29 NOTE — Telephone Encounter (Signed)
Called Chad [name] and let him know genetic test results are back. 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 We plan to discuss this result further next week, he directed me to call [facility] to set something up for him.

## 2019-05-04 ENCOUNTER — Other Ambulatory Visit: Payer: Self-pay | Admitting: *Deleted

## 2019-05-04 ENCOUNTER — Telehealth: Payer: Self-pay | Admitting: Internal Medicine

## 2019-05-04 DIAGNOSIS — C182 Malignant neoplasm of ascending colon: Secondary | ICD-10-CM

## 2019-05-04 NOTE — Telephone Encounter (Signed)
Please have the patient follow-up with me 10/08 or 10/09-MD; labs CBC CMP TSH; [facility]

## 2019-05-04 NOTE — Telephone Encounter (Signed)
Labs added. [place], pls sch the apts

## 2019-05-05 ENCOUNTER — Inpatient Hospital Stay: Payer: Medicaid Other | Admitting: Licensed Clinical Social Worker

## 2019-05-05 ENCOUNTER — Other Ambulatory Visit: Admission: RE | Admit: 2019-05-05 | Payer: Medicaid Other | Source: Ambulatory Visit

## 2019-05-06 ENCOUNTER — Inpatient Hospital Stay: Payer: Medicaid Other | Admitting: Internal Medicine

## 2019-05-06 ENCOUNTER — Inpatient Hospital Stay: Payer: Medicaid Other

## 2019-05-08 ENCOUNTER — Other Ambulatory Visit (INDEPENDENT_AMBULATORY_CARE_PROVIDER_SITE_OTHER): Payer: Self-pay | Admitting: Nurse Practitioner

## 2019-05-09 ENCOUNTER — Telehealth (INDEPENDENT_AMBULATORY_CARE_PROVIDER_SITE_OTHER): Payer: Self-pay

## 2019-05-09 ENCOUNTER — Encounter (INDEPENDENT_AMBULATORY_CARE_PROVIDER_SITE_OTHER): Payer: Self-pay

## 2019-05-09 NOTE — Telephone Encounter (Signed)
Spoke with Aniceto Boss at [facility] regarding the patient. The patient is under quarantine still so his procedure on 05/16/2019 will be rescheduled to the 05/23/2019 for left leg angio and his 05/23/2019 right leg angio will be rescheduled to 05/30/2019 with a 8:00 am arrival to the [facility] for Covid testing then 10:00 am arrival to the [facility]. The pre-procedure instructions will be faxed to [facility].

## 2019-05-15 ENCOUNTER — Other Ambulatory Visit (INDEPENDENT_AMBULATORY_CARE_PROVIDER_SITE_OTHER): Payer: Self-pay | Admitting: Nurse Practitioner

## 2019-05-16 ENCOUNTER — Inpatient Hospital Stay: Admission: RE | Admit: 2019-05-16 | Payer: Medicaid Other | Source: Ambulatory Visit

## 2019-05-20 ENCOUNTER — Other Ambulatory Visit: Admission: RE | Admit: 2019-05-20 | Payer: Medicaid Other | Source: Ambulatory Visit

## 2019-05-23 ENCOUNTER — Encounter
Admission: RE | Disposition: A | Discharge status: Skilled Nursing Facility | Payer: Self-pay | Source: Home / Self Care | Attending: Vascular Surgery

## 2019-05-23 ENCOUNTER — Other Ambulatory Visit: Payer: Medicaid Other

## 2019-05-23 ENCOUNTER — Ambulatory Visit
Admission: RE | Admit: 2019-05-23 | Discharge: 2019-05-23 | Disposition: A | Discharge status: Skilled Nursing Facility | Payer: Medicaid Other | Attending: Vascular Surgery | Admitting: Vascular Surgery

## 2019-05-23 ENCOUNTER — Encounter: Payer: Self-pay | Admitting: Vascular Surgery

## 2019-05-23 ENCOUNTER — Other Ambulatory Visit: Payer: Self-pay

## 2019-05-23 DIAGNOSIS — Z79899 Other long term (current) drug therapy: Secondary | ICD-10-CM | POA: Insufficient documentation

## 2019-05-23 DIAGNOSIS — Z8673 Personal history of transient ischemic attack (TIA), and cerebral infarction without residual deficits: Secondary | ICD-10-CM | POA: Insufficient documentation

## 2019-05-23 DIAGNOSIS — I70238 Atherosclerosis of native arteries of right leg with ulceration of other part of lower right leg: Secondary | ICD-10-CM | POA: Insufficient documentation

## 2019-05-23 DIAGNOSIS — L97909 Non-pressure chronic ulcer of unspecified part of unspecified lower leg with unspecified severity: Secondary | ICD-10-CM

## 2019-05-23 DIAGNOSIS — I70248 Atherosclerosis of native arteries of left leg with ulceration of other part of lower left leg: Secondary | ICD-10-CM | POA: Diagnosis not present

## 2019-05-23 DIAGNOSIS — L97829 Non-pressure chronic ulcer of other part of left lower leg with unspecified severity: Secondary | ICD-10-CM | POA: Diagnosis not present

## 2019-05-23 DIAGNOSIS — F419 Anxiety disorder, unspecified: Secondary | ICD-10-CM | POA: Insufficient documentation

## 2019-05-23 DIAGNOSIS — E785 Hyperlipidemia, unspecified: Secondary | ICD-10-CM | POA: Insufficient documentation

## 2019-05-23 DIAGNOSIS — L97819 Non-pressure chronic ulcer of other part of right lower leg with unspecified severity: Secondary | ICD-10-CM | POA: Diagnosis not present

## 2019-05-23 DIAGNOSIS — J449 Chronic obstructive pulmonary disease, unspecified: Secondary | ICD-10-CM | POA: Diagnosis not present

## 2019-05-23 DIAGNOSIS — F17210 Nicotine dependence, cigarettes, uncomplicated: Secondary | ICD-10-CM | POA: Diagnosis not present

## 2019-05-23 DIAGNOSIS — F329 Major depressive disorder, single episode, unspecified: Secondary | ICD-10-CM | POA: Insufficient documentation

## 2019-05-23 DIAGNOSIS — I70233 Atherosclerosis of native arteries of right leg with ulceration of ankle: Secondary | ICD-10-CM | POA: Diagnosis not present

## 2019-05-23 DIAGNOSIS — E43 Unspecified severe protein-calorie malnutrition: Secondary | ICD-10-CM | POA: Insufficient documentation

## 2019-05-23 DIAGNOSIS — K219 Gastro-esophageal reflux disease without esophagitis: Secondary | ICD-10-CM | POA: Diagnosis not present

## 2019-05-23 DIAGNOSIS — C679 Malignant neoplasm of bladder, unspecified: Secondary | ICD-10-CM | POA: Insufficient documentation

## 2019-05-23 DIAGNOSIS — I70299 Other atherosclerosis of native arteries of extremities, unspecified extremity: Secondary | ICD-10-CM

## 2019-05-23 DIAGNOSIS — I70243 Atherosclerosis of native arteries of left leg with ulceration of ankle: Secondary | ICD-10-CM | POA: Diagnosis not present

## 2019-05-23 HISTORY — PX: LOWER EXTREMITY ANGIOGRAPHY: CATH118251

## 2019-05-23 LAB — CREATININE, SERUM
Creatinine, Ser: 0.95 mg/dL (ref 0.61–1.24)
GFR calc Af Amer: >60 mL/min (ref >60)
GFR calc non Af Amer: >60 mL/min (ref >60)

## 2019-05-23 LAB — BUN: BUN: 23 mg/dL — ABNORMAL HIGH (ref 6–20)

## 2019-05-23 SURGERY — LOWER EXTREMITY ANGIOGRAPHY
Anesthesia: Moderate Sedation | Site: Leg Lower | Laterality: Left

## 2019-05-23 MED ORDER — HYDROMORPHONE HCL 1 MG/ML IJ SOLN: 1.0000 mg | Freq: Once | INTRAMUSCULAR | Status: DC | PRN

## 2019-05-23 MED ORDER — FENTANYL CITRATE (PF) 100 MCG/2ML IJ SOLN
INTRAMUSCULAR | Status: AC
  Filled 2019-05-23: qty 2

## 2019-05-23 MED ORDER — NITROGLYCERIN 1 MG/10 ML FOR IR/CATH LAB
INTRA_ARTERIAL | Status: DC | PRN
  Administered 2019-05-23: 250 ug

## 2019-05-23 MED ORDER — MIDAZOLAM HCL 5 MG/5ML IJ SOLN
INTRAMUSCULAR | Status: AC
  Filled 2019-05-23: qty 5

## 2019-05-23 MED ORDER — ATORVASTATIN CALCIUM 10 MG PO TABS: 10.0000 mg | ORAL_TABLET | Freq: Every day | ORAL | 11 refills | Status: AC

## 2019-05-23 MED ORDER — SODIUM CHLORIDE 0.9 % IV SOLN
INTRAVENOUS | Status: DC
  Administered 2019-05-23: 11:00:00 via INTRAVENOUS

## 2019-05-23 MED ORDER — IODIXANOL 320 MG/ML IV SOLN
INTRAVENOUS | Status: DC | PRN
  Administered 2019-05-23: 90 mL via INTRA_ARTERIAL

## 2019-05-23 MED ORDER — FAMOTIDINE 20 MG PO TABS: 40.0000 mg | ORAL_TABLET | Freq: Once | ORAL | Status: DC | PRN

## 2019-05-23 MED ORDER — CLOPIDOGREL BISULFATE 75 MG PO TABS: 75.0000 mg | ORAL_TABLET | Freq: Every day | ORAL | 11 refills | Status: DC

## 2019-05-23 MED ORDER — ASPIRIN EC 81 MG PO TBEC: 81.0000 mg | DELAYED_RELEASE_TABLET | Freq: Every day | ORAL | 2 refills | Status: AC

## 2019-05-23 MED ORDER — HEPARIN SODIUM (PORCINE) 1000 UNIT/ML IJ SOLN
INTRAMUSCULAR | Status: AC
  Filled 2019-05-23: qty 1

## 2019-05-23 MED ORDER — CLINDAMYCIN PHOSPHATE 300 MG/50ML IV SOLN
INTRAVENOUS | Status: AC
  Administered 2019-05-23: 300 mg via INTRAVENOUS
  Filled 2019-05-23: qty 50

## 2019-05-23 MED ORDER — HEPARIN SODIUM (PORCINE) 1000 UNIT/ML IJ SOLN
INTRAMUSCULAR | Status: DC | PRN
  Administered 2019-05-23: 4500 [IU] via INTRAVENOUS

## 2019-05-23 MED ORDER — ONDANSETRON HCL 4 MG/2ML IJ SOLN: 4.0000 mg | Freq: Four times a day (QID) | INTRAMUSCULAR | Status: DC | PRN

## 2019-05-23 MED ORDER — MIDAZOLAM HCL 2 MG/ML PO SYRP: 8.0000 mg | ORAL_SOLUTION | Freq: Once | ORAL | Status: DC | PRN

## 2019-05-23 MED ORDER — DIPHENHYDRAMINE HCL 50 MG/ML IJ SOLN: 50.0000 mg | Freq: Once | INTRAMUSCULAR | Status: DC | PRN

## 2019-05-23 MED ORDER — FENTANYL CITRATE (PF) 100 MCG/2ML IJ SOLN
INTRAMUSCULAR | Status: DC | PRN
  Administered 2019-05-23 (×3): 50 ug via INTRAVENOUS

## 2019-05-23 MED ORDER — MIDAZOLAM HCL 2 MG/2ML IJ SOLN
INTRAMUSCULAR | Status: DC | PRN
  Administered 2019-05-23 (×2): 1 mg via INTRAVENOUS
  Administered 2019-05-23: 2 mg via INTRAVENOUS

## 2019-05-23 MED ORDER — NITROGLYCERIN 1 MG/10 ML FOR IR/CATH LAB
INTRA_ARTERIAL | Status: AC
  Filled 2019-05-23: qty 10

## 2019-05-23 MED ORDER — METHYLPREDNISOLONE SODIUM SUCC 125 MG IJ SOLR: 125.0000 mg | Freq: Once | INTRAMUSCULAR | Status: DC | PRN

## 2019-05-23 MED ORDER — HEPARIN SOD (PORK) LOCK FLUSH 100 UNIT/ML IV SOLN
INTRAVENOUS | Status: AC
  Filled 2019-05-23: qty 5

## 2019-05-23 MED ORDER — CLINDAMYCIN PHOSPHATE 300 MG/50ML IV SOLN
300.0000 mg | Freq: Once | INTRAVENOUS | Status: AC
  Administered 2019-05-23: 12:00:00 300 mg via INTRAVENOUS

## 2019-05-23 SURGICAL SUPPLY — 18 items
BALLN ULTRVRSE 2.5X300X150 (BALLOONS) ×2
BALLN ULTRVRSE 3X100X150 (BALLOONS) ×2
BALLOON ULTRVRSE 2.5X300X150 (BALLOONS) ×1 IMPLANT
BALLOON ULTRVRSE 3X100X150 (BALLOONS) ×1 IMPLANT
CATH BEACON 5 .038 100 VERT TP (CATHETERS) ×2 IMPLANT
CATH CXI SUPP ANG 4FR 135 (CATHETERS) ×1 IMPLANT
CATH CXI SUPP ANG 4FR 135CM (CATHETERS) ×2
CATH PIG 70CM (CATHETERS) ×2 IMPLANT
DEVICE PRESTO INFLATION (MISCELLANEOUS) ×2 IMPLANT
DEVICE STARCLOSE SE CLOSURE (Vascular Products) ×2 IMPLANT
PACK ANGIOGRAPHY (CUSTOM PROCEDURE TRAY) ×2 IMPLANT
SHEATH ANL 5FRX90 (SHEATH) ×2 IMPLANT
SHEATH BRITE TIP 5FRX11 (SHEATH) ×2 IMPLANT
SYR MEDRAD MARK 7 150ML (SYRINGE) ×2 IMPLANT
TUBING CONTRAST HIGH PRESS 72 (TUBING) ×2 IMPLANT
WIRE G V18X300CM (WIRE) ×2 IMPLANT
WIRE J 3MM .035X145CM (WIRE) ×2 IMPLANT
WIRE MAGIC TORQUE 260C (WIRE) ×2 IMPLANT

## 2019-05-23 NOTE — Discharge Instructions (Signed)
Moderate Conscious Sedation, Adult, Care After These instructions provide you with information about caring for yourself after your procedure. Your health care provider may also give you more specific instructions. Your treatment has been planned according to current medical practices, but problems sometimes occur. Call your health care provider if you have any problems or questions after your procedure. What can I expect after the procedure? After your procedure, it is common:  To feel sleepy for several hours.  To feel clumsy and have poor balance for several hours.  To have poor judgment for several hours.  To vomit if you eat too soon. Follow these instructions at home: For at least 24 hours after the procedure:   Do not: ? Participate in activities where you could fall or become injured. ? Drive. ? Use heavy machinery. ? Drink alcohol. ? Take sleeping pills or medicines that cause drowsiness. ? Make important decisions or sign legal documents. ? Take care of children on your own.  Rest. Eating and drinking  Follow the diet recommended by your health care provider.  If you vomit: ? Drink water, juice, or soup when you can drink without vomiting. ? Make sure you have little or no nausea before eating solid foods. General instructions  Have a responsible adult stay with you until you are awake and alert.  Take over-the-counter and prescription medicines only as told by your health care provider.  If you smoke, do not smoke without supervision.  Keep all follow-up visits as told by your health care provider. This is important. Contact a health care provider if:  You keep feeling nauseous or you keep vomiting.  You feel light-headed.  You develop a rash.  You have a fever. Get help right away if:  You have trouble breathing. This information is not intended to replace advice given to you by your health care provider. Make sure you discuss any questions you have  with your health care provider. Document Released: 05/04/2013 Document Revised: 06/26/2017 Document Reviewed: 11/03/2015 Elsevier Patient Education  2020 [employer] After This sheet gives you information about how to care for yourself after your procedure. Your doctor may also give you more specific instructions. If you have problems or questions, contact your doctor. Follow these instructions at home: Insertion site care  Follow instructions from your doctor about how to take care of your long, thin tube (catheter) insertion area. Make sure you: ? Wash your hands with soap and water before you change your bandage (dressing). If you cannot use soap and water, use hand sanitizer. ? Change your bandage as told by your doctor. ? Leave stitches (sutures), skin glue, or skin tape (adhesive) strips in place. They may need to stay in place for 2 weeks or longer. If tape strips get loose and curl up, you may trim the loose edges. Do not remove tape strips completely unless your doctor says it is okay.  Do not take baths, swim, or use a hot tub until your doctor says it is okay.  You may shower 24-48 hours after the procedure or as told by your doctor. ? Gently wash the area with plain soap and water. ? Pat the area dry with a clean towel. ? Do not rub the area. This may cause bleeding.  Do not apply powder or lotion to the area. Keep the area clean and dry.  Check your insertion area every day for signs of infection. Check for: ? More redness, swelling, or pain. ? Fluid  or blood. ? Warmth. ? Pus or a bad smell. Activity  Rest as told by your doctor, usually for 1-2 days.  Do not lift anything that is heavier than 10 lbs. (4.5 kg) or as told by your doctor.  Do not drive for 24 hours if you were given a medicine to help you relax (sedative).  Do not drive or use heavy machinery while taking prescription pain medicine. General instructions   Go back to your normal  activities as told by your doctor, usually in about a week. Ask your doctor what activities are safe for you.  If the insertion area starts to bleed, lie flat and put pressure on the area. If the bleeding does not stop, get help right away. This is an emergency.  Drink enough fluid to keep your pee (urine) clear or pale yellow.  Take over-the-counter and prescription medicines only as told by your doctor.  Keep all follow-up visits as told by your doctor. This is important. Contact a doctor if:  You have a fever.  You have chills.  You have more redness, swelling, or pain around your insertion area.  You have fluid or blood coming from your insertion area.  The insertion area feels warm to the touch.  You have pus or a bad smell coming from your insertion area.  You have more bruising around the insertion area.  Blood collects in the tissue around the insertion area (hematoma) that may be painful to the touch. Get help right away if:  You have a lot of pain in the insertion area.  The insertion area swells very fast.  The insertion area is bleeding, and the bleeding does not stop after holding steady pressure on the area.  The area near or just beyond the insertion area becomes pale, cool, tingly, or numb. These symptoms may be an emergency. Do not wait to see if the symptoms will go away. Get medical help right away. Call your local emergency services (911 in the [place] Do not drive yourself to the hospital. Summary  After the procedure, it is common to have bruising and tenderness at the long, thin tube insertion area.  After the procedure, it is important to rest and drink plenty of fluids.  Do not take baths, swim, or use a hot tub until your doctor says it is okay to do so. You may shower 24-48 hours after the procedure or as told by your doctor.  If the insertion area starts to bleed, lie flat and put pressure on the area. If the bleeding does not stop, get help  right away. This is an emergency. This information is not intended to replace advice given to you by your health care provider. Make sure you discuss any questions you have with your health care provider. Document Released: 10/10/2008 Document Revised: 06/26/2017 Document Reviewed: 07/08/2016 Elsevier Patient Education  2020 [employer]. Femoral Site Care This sheet gives you information about how to care for yourself after your procedure. Your health care provider may also give you more specific instructions. If you have problems or questions, contact your health care provider. What can I expect after the procedure? After the procedure, it is common to have:  Bruising that usually fades within 1-2 weeks.  Tenderness at the site. Follow these instructions at home: Wound care  Follow instructions from your health care provider about how to take care of your insertion site. Make sure you: ? Wash your hands with soap and water before you  change your bandage (dressing). If soap and water are not available, use hand sanitizer. ? Change your dressing as told by your health care provider. ? Leave stitches (sutures), skin glue, or adhesive strips in place. These skin closures may need to stay in place for 2 weeks or longer. If adhesive strip edges start to loosen and curl up, you may trim the loose edges. Do not remove adhesive strips completely unless your health care provider tells you to do that.  Do not take baths, swim, or use a hot tub until your health care provider approves.  You may shower 24-48 hours after the procedure or as told by your health care provider. ? Gently wash the site with plain soap and water. ? Pat the area dry with a clean towel. ? Do not rub the site. This may cause bleeding.  Do not apply powder or lotion to the site. Keep the site clean and dry.  Check your femoral site every day for signs of infection. Check for: ? Redness, swelling, or pain. ? Fluid or  blood. ? Warmth. ? Pus or a bad smell. Activity  For the first 2-3 days after your procedure, or as long as directed: ? Avoid climbing stairs as much as possible. ? Do not squat.  Do not lift anything that is heavier than 10 lb (4.5 kg), or the limit that you are told, until your health care provider says that it is safe.  Rest as directed. ? Avoid sitting for a long time without moving. Get up to take short walks every 1-2 hours.  Do not drive for 24 hours if you were given a medicine to help you relax (sedative). General instructions  Take over-the-counter and prescription medicines only as told by your health care provider.  Keep all follow-up visits as told by your health care provider. This is important. Contact a health care provider if you have:  A fever or chills.  You have redness, swelling, or pain around your insertion site. Get help right away if:  The catheter insertion area swells very fast.  You pass out.  You suddenly start to sweat or your skin gets clammy.  The catheter insertion area is bleeding, and the bleeding does not stop when you hold steady pressure on the area.  The area near or just beyond the catheter insertion site becomes pale, cool, tingly, or numb. These symptoms may represent a serious problem that is an emergency. Do not wait to see if the symptoms will go away. Get medical help right away. Call your local emergency services (911 in the [place] Do not drive yourself to the hospital. Summary  After the procedure, it is common to have bruising that usually fades within 1-2 weeks.  Check your femoral site every day for signs of infection.  Do not lift anything that is heavier than 10 lb (4.5 kg), or the limit that you are told, until your health care provider says that it is safe. This information is not intended to replace advice given to you by your health care provider. Make sure you discuss any questions you have with your health care  provider. Document Released: 03/17/2014 Document Revised: 07/27/2017 Document Reviewed: 07/27/2017 Elsevier Patient Education  2020 [employer].

## 2019-05-23 NOTE — Op Note (Signed)
[facility]  Percutaneous Study/Intervention Procedural Note   Date of Surgery: 05/23/2019  Surgeon(s):DEW,JASON    Assistants:none  Pre-operative Diagnosis: PAD with ulceration bilateral lower extremities  Post-operative diagnosis:  Same  Procedure(s) Performed:             1.  Ultrasound guidance for vascular access right femoral artery             2.  Catheter placement into left common femoral artery from right femoral approach             3.  Aortogram and selective left lower extremity angiogram including selective images of the posterior tibial and anterior tibial arteries             4.  Percutaneous transluminal angioplasty of the left posterior tibial artery with 2.5 mm diameter angioplasty balloon from the foot to its origin and then a 3 mm diameter angioplasty balloon proximally             5.  StarClose closure device right femoral artery  EBL: 3 cc  Contrast: 90 cc  Fluoro Time: 11.1 minutes  Moderate Conscious Sedation Time: approximately 40 minutes using 4 mg of Versed and 150 mcg of Fentanyl              Indications:  Patient is a 58 y.o.male with nonhealing ulcerations bilaterally. The patient has noninvasive study showing diminished flow bilaterally worse on the left than the right. The patient is brought in for angiography for further evaluation and potential treatment.  Due to the limb threatening nature of the situation, angiogram was performed for attempted limb salvage. The patient is aware that if the procedure fails, amputation would be expected.  The patient also understands that even with successful revascularization, amputation may still be required due to the severity of the situation.  Risks and benefits are discussed and informed consent is obtained.   Procedure:  The patient was identified and appropriate procedural time out was performed.  The patient was then placed supine on the table and prepped and draped in the usual  sterile fashion. Moderate conscious sedation was administered during a face to face encounter with the patient throughout the procedure with my supervision of the RN administering medicines and monitoring the patient's vital signs, pulse oximetry, telemetry and mental status throughout from the start of the procedure until the patient was taken to the recovery room. Ultrasound was used to evaluate the right common femoral artery.  It was patent .  A digital ultrasound image was acquired.  A Seldinger needle was used to access the right common femoral artery under direct ultrasound guidance and a permanent image was performed.  A 0.035 J wire was advanced without resistance and a 5Fr sheath was placed.  Pigtail catheter was placed into the aorta and an AP aortogram was performed. 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  The iliac arteries were widely patent and of normal caliber.  The renal arteries appeared to be patent with good flow.. I then crossed the aortic bifurcation and advanced to the left femoral head. Selective left lower extremity angiogram was then performed. This demonstrated reasonably normal common femoral artery, profunda femoris artery, superficial femoral artery, and popliteal arteries.  There is a typical tibial trifurcation with a peroneal artery which was the only patent runoff distally initially and it did not have significant disease.  The posterior tibial artery is diffusely  diseased with multiple areas of high-grade stenosis to short segment occlusion but it did reconstitute in the foot although imaging on the initial runs was somewhat limited.  The anterior tibial artery appeared to occlude proximally without obvious distal reconstitution seen on initial images. It was felt that it was in the patient's best interest to proceed with intervention after these images to avoid a second procedure and a larger amount of  contrast and fluoroscopy based off of the findings from the initial angiogram. The patient was systemically heparinized and a 5 French 90 cm sheath was then placed over the Magic torque wire. I then used a Kumpe catheter and the V18 wire to navigate down into the posterior tibial artery.  Selective imaging showed that there was a distal reconstitution of the posterior tibial artery and that the vessel was diffusely diseased.  Using this image as a roadmap, I was able to cross these lesions and get a wire all the way into the foot past the disease and beyond the distal reconstitution.  I then used a 2.5 mm diameter by 30 cm length angioplasty balloon and inflated from the proximal foot up to the proximal to mid posterior tibial artery inflating this to 6 atm for 1 minute.  The 2.5 mm balloon was then pulled back to the proximal posterior tibial artery and inflated to 10 atm for 1 minute.  Completion imaging showed 50% residual disease in the proximal posterior tibial artery so I upsized to a 3 mm balloon and inflated this up to 12 atm for 1 minute.  Completion imaging after intra-arterial nitroglycerin was given for spasm showed brisk flow into the foot with less than 25% residual stenosis throughout the vessel.  The peroneal artery remained continuous.  I then used a Kumpe catheter and the V 18 wire to selectively cannulate the anterior tibial artery to perform imaging of this to determine whether or not there was a target for distal reconstitution.  The anterior tibial artery became small and occluded in the mid to distal segment without distal reconstitution seen on selective imaging.  There was no role for revascularization of this vessel. I elected to terminate the procedure. The sheath was removed and StarClose closure device was deployed in the right femoral artery with excellent hemostatic result. The patient was taken to the recovery room in stable condition having tolerated the procedure well.  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  The iliac arteries were widely patent and of normal caliber.  The renal arteries appeared to be patent with good flow.             Left lower Extremity:  This demonstrated reasonably normal common femoral artery, profunda femoris artery, superficial femoral artery, and popliteal arteries.  There is a typical tibial trifurcation with a peroneal artery which was the only patent runoff distally initially and it did not have significant disease.  The posterior tibial artery is diffusely diseased with multiple areas of high-grade stenosis to short segment occlusion but it did reconstitute in the foot although imaging on the initial runs was somewhat limited.  The anterior tibial artery appeared to occlude proximally without obvious distal reconstitution seen on initial images.   Disposition: Patient was taken to the recovery room in stable condition having tolerated the procedure well.  Complications: None  Leotis Pain 05/23/2019 12:19 PM  This note was created with Dragon Medical transcription system. Any errors in dictation are purely unintentional.

## 2019-05-27 ENCOUNTER — Other Ambulatory Visit: Admission: RE | Admit: 2019-05-27 | Payer: Medicaid Other | Source: Ambulatory Visit

## 2019-05-27 NOTE — Pre-Procedure Instructions (Signed)
Patient resides at [facility].  Spoke with April, supervisor regarding a covid test prior to his procedure on Monday. She stated that he had a covid test completed today, 05/27/19.  She was not sure when the results would show for him but thought it would be Sunday.  I explained that he needed to bring a hard copy of the results with him on Monday.  He was also tested earlier in the week and those results came through yesterday as a negative. Asked that either or both set of results come with the patient

## 2019-05-30 ENCOUNTER — Ambulatory Visit
Admission: RE | Admit: 2019-05-30 | Discharge: 2019-05-30 | Disposition: A | Discharge status: Skilled Nursing Facility | Payer: Medicaid Other | Attending: Vascular Surgery | Admitting: Vascular Surgery

## 2019-05-30 ENCOUNTER — Encounter: Payer: Self-pay | Admitting: *Deleted

## 2019-05-30 ENCOUNTER — Other Ambulatory Visit (INDEPENDENT_AMBULATORY_CARE_PROVIDER_SITE_OTHER): Payer: Self-pay | Admitting: Vascular Surgery

## 2019-05-30 ENCOUNTER — Other Ambulatory Visit: Payer: Self-pay

## 2019-05-30 ENCOUNTER — Encounter
Admission: RE | Disposition: A | Discharge status: Skilled Nursing Facility | Payer: Self-pay | Source: Home / Self Care | Attending: Vascular Surgery

## 2019-05-30 DIAGNOSIS — E785 Hyperlipidemia, unspecified: Secondary | ICD-10-CM | POA: Insufficient documentation

## 2019-05-30 DIAGNOSIS — L97919 Non-pressure chronic ulcer of unspecified part of right lower leg with unspecified severity: Secondary | ICD-10-CM | POA: Insufficient documentation

## 2019-05-30 DIAGNOSIS — Z8673 Personal history of transient ischemic attack (TIA), and cerebral infarction without residual deficits: Secondary | ICD-10-CM | POA: Insufficient documentation

## 2019-05-30 DIAGNOSIS — I739 Peripheral vascular disease, unspecified: Secondary | ICD-10-CM | POA: Insufficient documentation

## 2019-05-30 DIAGNOSIS — J449 Chronic obstructive pulmonary disease, unspecified: Secondary | ICD-10-CM | POA: Insufficient documentation

## 2019-05-30 DIAGNOSIS — I70203 Unspecified atherosclerosis of native arteries of extremities, bilateral legs: Secondary | ICD-10-CM | POA: Diagnosis not present

## 2019-05-30 DIAGNOSIS — Z880 Allergy status to penicillin: Secondary | ICD-10-CM | POA: Insufficient documentation

## 2019-05-30 DIAGNOSIS — C679 Malignant neoplasm of bladder, unspecified: Secondary | ICD-10-CM | POA: Insufficient documentation

## 2019-05-30 DIAGNOSIS — B182 Chronic viral hepatitis C: Secondary | ICD-10-CM | POA: Insufficient documentation

## 2019-05-30 DIAGNOSIS — M7989 Other specified soft tissue disorders: Secondary | ICD-10-CM | POA: Diagnosis not present

## 2019-05-30 DIAGNOSIS — K219 Gastro-esophageal reflux disease without esophagitis: Secondary | ICD-10-CM | POA: Diagnosis not present

## 2019-05-30 DIAGNOSIS — F17210 Nicotine dependence, cigarettes, uncomplicated: Secondary | ICD-10-CM | POA: Diagnosis not present

## 2019-05-30 DIAGNOSIS — Z85038 Personal history of other malignant neoplasm of large intestine: Secondary | ICD-10-CM | POA: Diagnosis not present

## 2019-05-30 DIAGNOSIS — E43 Unspecified severe protein-calorie malnutrition: Secondary | ICD-10-CM | POA: Diagnosis not present

## 2019-05-30 DIAGNOSIS — I70299 Other atherosclerosis of native arteries of extremities, unspecified extremity: Secondary | ICD-10-CM

## 2019-05-30 DIAGNOSIS — F329 Major depressive disorder, single episode, unspecified: Secondary | ICD-10-CM | POA: Diagnosis not present

## 2019-05-30 HISTORY — PX: LOWER EXTREMITY ANGIOGRAPHY: CATH118251

## 2019-05-30 LAB — CREATININE, SERUM
Creatinine, Ser: 0.81 mg/dL (ref 0.61–1.24)
GFR calc Af Amer: >60 mL/min (ref >60)
GFR calc non Af Amer: >60 mL/min (ref >60)

## 2019-05-30 LAB — BUN: BUN: 19 mg/dL (ref 6–20)

## 2019-05-30 SURGERY — LOWER EXTREMITY ANGIOGRAPHY
Anesthesia: Moderate Sedation | Site: Leg Lower | Laterality: Right

## 2019-05-30 MED ORDER — HEPARIN SODIUM (PORCINE) 1000 UNIT/ML IJ SOLN
INTRAMUSCULAR | Status: AC
  Filled 2019-05-30: qty 1

## 2019-05-30 MED ORDER — IODIXANOL 320 MG/ML IV SOLN
INTRAVENOUS | Status: DC | PRN
  Administered 2019-05-30: 45 mL via INTRA_ARTERIAL

## 2019-05-30 MED ORDER — FENTANYL CITRATE (PF) 100 MCG/2ML IJ SOLN
INTRAMUSCULAR | Status: DC | PRN
  Administered 2019-05-30: 50 ug via INTRAVENOUS
  Administered 2019-05-30: 25 ug via INTRAVENOUS

## 2019-05-30 MED ORDER — NITROGLYCERIN 1 MG/10 ML FOR IR/CATH LAB
INTRA_ARTERIAL | Status: AC
  Filled 2019-05-30: qty 10

## 2019-05-30 MED ORDER — FAMOTIDINE 20 MG PO TABS: 40.0000 mg | ORAL_TABLET | Freq: Once | ORAL | Status: DC | PRN

## 2019-05-30 MED ORDER — DIPHENHYDRAMINE HCL 50 MG/ML IJ SOLN: 50.0000 mg | Freq: Once | INTRAMUSCULAR | Status: DC | PRN

## 2019-05-30 MED ORDER — SODIUM CHLORIDE 0.9 % IV SOLN
INTRAVENOUS | Status: DC
  Administered 2019-05-30: 11:00:00 via INTRAVENOUS

## 2019-05-30 MED ORDER — MIDAZOLAM HCL 2 MG/ML PO SYRP: 8.0000 mg | ORAL_SOLUTION | Freq: Once | ORAL | Status: DC | PRN

## 2019-05-30 MED ORDER — ONDANSETRON HCL 4 MG/2ML IJ SOLN: 4.0000 mg | Freq: Four times a day (QID) | INTRAMUSCULAR | Status: DC | PRN

## 2019-05-30 MED ORDER — MIDAZOLAM HCL 2 MG/2ML IJ SOLN
INTRAMUSCULAR | Status: DC | PRN
  Administered 2019-05-30: 0.5 mg via INTRAVENOUS
  Administered 2019-05-30: 2 mg via INTRAVENOUS

## 2019-05-30 MED ORDER — FENTANYL CITRATE (PF) 100 MCG/2ML IJ SOLN
INTRAMUSCULAR | Status: AC
  Filled 2019-05-30: qty 2

## 2019-05-30 MED ORDER — HYDROMORPHONE HCL 1 MG/ML IJ SOLN: 1.0000 mg | Freq: Once | INTRAMUSCULAR | Status: DC | PRN

## 2019-05-30 MED ORDER — CLINDAMYCIN PHOSPHATE 300 MG/50ML IV SOLN
300.0000 mg | Freq: Once | INTRAVENOUS | Status: AC
  Administered 2019-05-30: 12:00:00 300 mg via INTRAVENOUS

## 2019-05-30 MED ORDER — CLINDAMYCIN PHOSPHATE 300 MG/50ML IV SOLN
INTRAVENOUS | Status: AC
  Administered 2019-05-30: 300 mg via INTRAVENOUS
  Filled 2019-05-30: qty 50

## 2019-05-30 MED ORDER — HEPARIN SODIUM (PORCINE) 1000 UNIT/ML IJ SOLN
INTRAMUSCULAR | Status: DC | PRN
  Administered 2019-05-30: 4000 [IU] via INTRAVENOUS

## 2019-05-30 MED ORDER — HEPARIN SOD (PORK) LOCK FLUSH 100 UNIT/ML IV SOLN
INTRAVENOUS | Status: AC
  Administered 2019-05-30: 14:00:00
  Filled 2019-05-30: qty 5

## 2019-05-30 MED ORDER — NITROGLYCERIN 1 MG/10 ML FOR IR/CATH LAB
INTRA_ARTERIAL | Status: DC | PRN
  Administered 2019-05-30: 250 ug

## 2019-05-30 MED ORDER — METHYLPREDNISOLONE SODIUM SUCC 125 MG IJ SOLR: 125.0000 mg | Freq: Once | INTRAMUSCULAR | Status: DC | PRN

## 2019-05-30 MED ORDER — MIDAZOLAM HCL 2 MG/2ML IJ SOLN
INTRAMUSCULAR | Status: AC
  Filled 2019-05-30: qty 4

## 2019-05-30 SURGICAL SUPPLY — 12 items
BALLN ULTRVRSE 018 2.5X150X150 (BALLOONS) ×2
BALLOON ULTRVS 018 2.5X150X150 (BALLOONS) ×1 IMPLANT
CATH BEACON 5 .035 65 RIM TIP (CATHETERS) ×2 IMPLANT
CATH CXI SUPP ANG 4FR 135 (CATHETERS) ×1 IMPLANT
CATH CXI SUPP ANG 4FR 135CM (CATHETERS) ×2
DEVICE PRESTO INFLATION (MISCELLANEOUS) ×2 IMPLANT
DEVICE STARCLOSE SE CLOSURE (Vascular Products) ×2 IMPLANT
GLIDEWIRE ADV .035X260CM (WIRE) ×2 IMPLANT
PACK ANGIOGRAPHY (CUSTOM PROCEDURE TRAY) ×2 IMPLANT
SHEATH ANL 5FRX90 (SHEATH) ×2 IMPLANT
SHEATH BRITE TIP 5FRX11 (SHEATH) ×2 IMPLANT
WIRE G V18X300CM (WIRE) ×2 IMPLANT

## 2019-05-30 NOTE — Op Note (Signed)
[facility]  Percutaneous Study/Intervention Procedural Note   Date of Surgery: 05/30/2019  Surgeon(s):Trianna Lupien    Assistants:none  Pre-operative Diagnosis: PAD with ulceration bilateral lower extremities  Post-operative diagnosis:  Same  Procedure(s) Performed:             1.  Ultrasound guidance for vascular access left femoral artery             2.  Catheter placement into right SFA from left femoral approach             3.  Selective right lower extremity angiogram             4.  Percutaneous transluminal angioplasty of right distal posterior tibial artery with 2.5 mm diameter angioplasty balloon             5.  StarClose closure device left femoral artery  EBL: 5 cc  Contrast: 45 cc  Fluoro Time: 5.6 minutes  Moderate Conscious Sedation Time: approximately 25 minutes using 2.5 mg of Versed and 75 mcg of Fentanyl              Indications:  Patient is a 58 y.o.male with nonhealing ulcerations bilaterally status post left lower extremity revascularization last week. The patient has noninvasive study showing poor digital waveforms bilaterally. The patient is brought in for angiography for further evaluation and potential treatment.  Due to the limb threatening nature of the situation, angiogram was performed for attempted limb salvage. The patient is aware that if the procedure fails, amputation would be expected.  The patient also understands that even with successful revascularization, amputation may still be required due to the severity of the situation.  Risks and benefits are discussed and informed consent is obtained.   Procedure:  The patient was identified and appropriate procedural time out was performed.  The patient was then placed supine on the table and prepped and draped in the usual sterile fashion. Moderate conscious sedation was administered during a face to face encounter with the patient throughout the procedure with my supervision of the  RN administering medicines and monitoring the patient's vital signs, pulse oximetry, telemetry and mental status throughout from the start of the procedure until the patient was taken to the recovery room. Ultrasound was used to evaluate the left common femoral artery.  It was patent .  A digital ultrasound image was acquired.  A Seldinger needle was used to access the left common femoral artery under direct ultrasound guidance and a permanent image was performed.  A 0.035 J wire was advanced without resistance and a 5Fr sheath was placed.   Aortogram was not performed as one was done last week and did not require any inner mention.  I then crossed the aortic bifurcation with a rim catheter and an advantage wire and advanced to the right femoral head.  To opacify the tibial vessels, I advanced the catheter into the mid SFA over the advantage wire.  Selective right lower extremity angiogram was then performed. This demonstrated Normal common femoral artery, profunda femoris artery, superficial femoral artery, and popliteal artery.  There is normal tibial trifurcation and in the proximal portion all 3 vessels had normal appearance.  The flow was much more brisk through the posterior tibial artery which was the only runoff that was continuous to the foot.  The anterior tibial artery became very small and occluded above the ankle without distal reconstitution.  The peroneal artery was small and terminated in the typical fashion  just above the ankle but the flow into the foot from the peroneal artery was very poor.  The posterior tibial artery had nearly occlusive stenosis at the level of the ankle in the 80 to 90% range with more moderate stenosis proximal to this in the 60 to 70% range in the distal lower leg. It was felt that it was in the patient's best interest to proceed with intervention after these images to avoid a second procedure and a larger amount of contrast and fluoroscopy based off of the findings from  the initial angiogram. The patient was systemically heparinized and a 6 French 90 cm sheath was then placed over the Terum[employer] Advantage wire. I then used a CXI catheter and the advantage wire to navigate down into the posterior tibial artery.  I then exchanged for a V 18 wire and using the V 18 wire and the CXI catheter I was able to cross the stenoses in the distal posterior tibial artery in the lower leg and ankle area and confirm intraluminal flow in the foot beyond the stenoses.  I then remove the CXI catheter and treated with a 2.5 mm diameter by 15 cm length angioplasty balloon from the midfoot up to just above the ankle with a gentle inflation of 4 atm for 1 minute.  The balloon was then pulled back to the ankle and distal lower leg and inflated slightly more vigorously to 8 atm for 1 minute.  Intra-arterial nitroglycerin was given to reduce distal vasospasm and imaging was performed.  This demonstrated significantly improved flow into the foot with only about a 25 to 30% residual stenosis at the ankle and less than 10% residual stenosis in the lower leg. I elected to terminate the procedure. The sheath was removed and StarClose closure device was deployed in the left femoral artery with excellent hemostatic result. The patient was taken to the recovery room in stable condition having tolerated the procedure well.  Findings:                      Right Lower Extremity:  Normal common femoral artery, profunda femoris artery, superficial femoral artery, and popliteal artery.  There is normal tibial trifurcation and in the proximal portion all 3 vessels had normal appearance.  The flow was much more brisk through the posterior tibial artery which was the only runoff that was continuous to the foot.  The anterior tibial artery became very small and occluded above the ankle without distal reconstitution.  The peroneal artery was small and terminated in the typical fashion just above the ankle but the flow into  the foot from the peroneal artery was very poor.  The posterior tibial artery had nearly occlusive stenosis at the level of the ankle in the 80 to 90% range with more moderate stenosis proximal to this in the 60 to 70% range in the distal lower leg.   Disposition: Patient was taken to the recovery room in stable condition having tolerated the procedure well.  Complications: None  Leotis Pain 05/30/2019 12:15 PM   This note was created with Dragon Medical transcription system. Any errors in dictation are purely unintentional.

## 2019-05-30 NOTE — Discharge Instructions (Signed)
Angiogram, Care After °This sheet gives you information about how to care for yourself after your procedure. Your health care provider may also give you more specific instructions. If you have problems or questions, contact your health care provider. °What can I expect after the procedure? °After the procedure, it is common to have bruising and tenderness at the catheter insertion area. °Follow these instructions at home: °Insertion site care °· Follow instructions from your health care provider about how to take care of your insertion site. Make sure you: °? Wash your hands with soap and water before you change your bandage (dressing). If soap and water are not available, use hand sanitizer. °? Change your dressing as told by your health care provider. °? Leave stitches (sutures), skin glue, or adhesive strips in place. These skin closures may need to stay in place for 2 weeks or longer. If adhesive strip edges start to loosen and curl up, you may trim the loose edges. Do not remove adhesive strips completely unless your health care provider tells you to do that. °· Do not take baths, swim, or use a hot tub until your health care provider approves. °· You may shower 24-48 hours after the procedure or as told by your health care provider. °? Gently wash the site with plain soap and water. °? Pat the area dry with a clean towel. °? Do not rub the site. This may cause bleeding. °· Do not apply powder or lotion to the site. Keep the site clean and dry. °· Check your insertion site every day for signs of infection. Check for: °? Redness, swelling, or pain. °? Fluid or blood. °? Warmth. °? Pus or a bad smell. °Activity °· Rest as told by your health care provider, usually for 1-2 days. °· Do not lift anything that is heavier than 10 lbs. (4.5 kg) or as told by your health care provider. °· Do not drive for 24 hours if you were given a medicine to help you relax (sedative). °· Do not drive or use heavy machinery while  taking prescription pain medicine. °General instructions ° °· Return to your normal activities as told by your health care provider, usually in about a week. Ask your health care provider what activities are safe for you. °· If the catheter site starts bleeding, lie flat and put pressure on the site. If the bleeding does not stop, get help right away. This is a medical emergency. °· Drink enough fluid to keep your urine clear or pale yellow. This helps flush the contrast dye from your body. °· Take over-the-counter and prescription medicines only as told by your health care provider. °· Keep all follow-up visits as told by your health care provider. This is important. °Contact a health care provider if: °· You have a fever or chills. °· You have redness, swelling, or pain around your insertion site. °· You have fluid or blood coming from your insertion site. °· The insertion site feels warm to the touch. °· You have pus or a bad smell coming from your insertion site. °· You have bruising around the insertion site. °· You notice blood collecting in the tissue around the catheter site (hematoma). The hematoma may be painful to the touch. °Get help right away if: °· You have severe pain at the catheter insertion area. °· The catheter insertion area swells very fast. °· The catheter insertion area is bleeding, and the bleeding does not stop when you hold steady pressure on the area. °·   The area near or just beyond the catheter insertion site becomes pale, cool, tingly, or numb. °These symptoms may represent a serious problem that is an emergency. Do not wait to see if the symptoms will go away. Get medical help right away. Call your local emergency services (911 in the [place] Do not drive yourself to the hospital. °Summary °· After the procedure, it is common to have bruising and tenderness at the catheter insertion area. °· After the procedure, it is important to rest and drink plenty of fluids. °· Do not take baths,  swim, or use a hot tub until your health care provider says it is okay to do so. You may shower 24-48 hours after the procedure or as told by your health care provider. °· If the catheter site starts bleeding, lie flat and put pressure on the site. If the bleeding does not stop, get help right away. This is a medical emergency. °This information is not intended to replace advice given to you by your health care provider. Make sure you discuss any questions you have with your health care provider. °Document Released: 01/30/2005 Document Revised: 06/26/2017 Document Reviewed: 06/18/2016 °Elsevier Patient Education © 2020 [employer] ° °

## 2019-05-30 NOTE — H&P (Signed)
[facility] Admission History & Physical  MRN : [id number]  Chaske Orndorff is a 58 y.o. (28-May-1961) male who presents with chief complaint of No chief complaint on file. Marland Kitchen  History of Present Illness: Patient presents today for right leg angiogram with possible revascularization.  He has previously undergone left lower extremity tibial intervention last week.  Had no periprocedural complications.  His wounds present bilaterally as well as leg swelling. No new complaints today  No current facility-administered medications for this encounter.     Past Medical History:  Diagnosis Date  . Anemia   . Anxiety   . ARF (acute respiratory failure) ([place])   . Bladder cancer ([place])   . Cancer [facility])    prostate  . COPD (chronic obstructive pulmonary disease) ([place])   . Depression   . Dysphagia   . Family history of colon cancer   . Family history of kidney cancer   . Family history of leukemia   . Family history of prostate cancer   . GERD (gastroesophageal reflux disease)   . Hepatitis    chronic hep c  . Hydronephrosis   . Hyperlipidemia   . Knee pain    Left  . Malignant neoplasm of colon ([place])   . Nerve pain   . Peripheral vascular disease ([place])   . Stroke ([place])   . Urinary frequency     Past Surgical History:  Procedure Laterality Date  . COLON SURGERY     En bloc extended right hemicolectomy 07/2017  . CYSTOSCOPY W/ RETROGRADES Right 08/30/2018   Procedure: CYSTOSCOPY WITH RETROGRADE PYELOGRAM;  Surgeon: Hollice Espy, MD;  Location: [facility] OR[facility];  Service: Urology;  Laterality: Right;  . CYSTOSCOPY WITH STENT PLACEMENT Right 04/25/2018   Procedure: CYSTOSCOPY WITH STENT PLACEMENT;  Surgeon: Hollice Espy, MD;  Location: [facility] OR[facility];  Service: Urology;  Laterality: Right;  . CYSTOSCOPY WITH STENT PLACEMENT Right 08/30/2018   Procedure: [place] WITH STENT Exchange;  Surgeon: Hollice Espy, MD;  Location: [facility];  Service: Urology;  Laterality: Right;   . CYSTOSCOPY WITH STENT PLACEMENT Right 03/07/2019   Procedure: CYSTOSCOPY WITH STENT Exchange;  Surgeon: Hollice Espy, MD;  Location: [facility] OR[facility];  Service: Urology;  Laterality: Right;  . LOWER EXTREMITY ANGIOGRAPHY Left 05/23/2019   Procedure: LOWER EXTREMITY ANGIOGRAPHY;  Surgeon: Algernon Huxley, MD;  Location: [facility];  Service: Cardiovascular;  Laterality: Left;  . PORTA CATH INSERTION N/A 02/28/2019   Procedure: PORTA CATH INSERTION;  Surgeon: Algernon Huxley, MD;  Location: [facility];  Service: Cardiovascular;  Laterality: N/A;  . tumor removed       Social History Social History   Tobacco Use  . Smoking status: Current Every Day Smoker    Packs/day: 1.00    Types: Cigarettes  . Smokeless tobacco: Never Used  Substance Use Topics  . Alcohol use: Not Currently    Frequency: Never  . Drug use: Not Currently    Family History Family History  Problem Relation Age of Onset  . Prostate cancer Father 19  . Cancer Brother 57       unsure type  . Cancer Paternal Uncle        unsure type  . Cancer Maternal Grandmother        unsure type  . Cancer Paternal Grandmother        unsure type  . Kidney cancer Paternal Grandfather   . Cancer Other        unsure  types  . Leukemia Son   . Cancer Son        other cancers, possibly colon    Allergies  Allergen Reactions  . Penicillins Rash    REVIEW OF SYSTEMS (Negative unless checked)  Constitutional: [x] ?Weight loss  [] ?Fever  [] ?Chills Cardiac: [] ?Chest pain   [] ?Chest pressure   [] ?Palpitations   [] ?Shortness of breath when laying flat   [] ?Shortness of breath at rest   [x] ?Shortness of breath with exertion. Vascular:  [] ?Pain in legs with walking   [] ?Pain in legs at rest   [] ?Pain in legs when laying flat   [] ?Claudication   [] ?Pain in feet when walking  [] ?Pain in feet at rest  [] ?Pain in feet when laying flat   [] ?History of DVT   [] ?Phlebitis   [] ?Swelling in legs   [] ?Varicose veins   [] ?Non-healing  ulcers Pulmonary:   [] ?Uses home oxygen   [] ?Productive cough   [] ?Hemoptysis   [] ?Wheeze  [x] ?COPD   [] ?Asthma Neurologic:  [x] ?Dizziness  [] ?Blackouts   [] ?Seizures   [x] ?History of stroke   [] ?History of TIA  [] ?Aphasia   [] ?Temporary blindness   [] ?Dysphagia   [] ?Weakness or numbness in arms   [] ?Weakness or numbness in legs Musculoskeletal:  [x] ?Arthritis   [] ?Joint swelling   [x] ?Joint pain   [] ?Low back pain Hematologic:  [] ?Easy bruising  [] ?Easy bleeding   [] ?Hypercoagulable state   [] ?Anemic  [] ?Hepatitis Gastrointestinal:  [] ?Blood in stool   [] ?Vomiting blood  [] ?Gastroesophageal reflux/heartburn   [] ?Abdominal pain Genitourinary:  [x] ?Chronic kidney disease   [x] ?Difficult urination  [] ?Frequent urination  [] ?Burning with urination   [] ?Hematuria Skin:  [] ?Rashes   [x] ?Ulcers   [x] ?Wounds Psychological:  [] ?History of anxiety   [] ? History of major depression.  Physical Examination  There were no vitals filed for this visit. There is no height or weight on file to calculate BMI. Gen: WD/WN, NAD Head: [place]/AT, No temporalis wasting. Ear/Nose/Throat: Hearing grossly intact, nares w/o erythema or drainage, oropharynx w/o Erythema/Exudate,  Eyes: Conjunctiva clear, sclera non-icteric Neck: Trachea midline.  No JVD.  Pulmonary:  Good air movement, respirations not labored, no use of accessory muscles.  Cardiac: RRR, normal S1, S2. Vascular:  Vessel Right Left  Radial Palpable Palpable   Musculoskeletal: M/S 5/5 throughout.  Extremities without ischemic changes.  No deformity or atrophy. 1-2+ BLE swelling Neurologic: Sensation grossly intact in extremities.  Symmetrical.  Speech is fluent. Motor exam as listed above. Psychiatric: Judgment intact, Mood & affect appropriate for pt's clinical situation. Dermatologic: wounds present on both LE dressed today      CBC Lab Results  Component Value Date   WBC 7.8 03/11/2019   HGB 12.0 (L) 03/11/2019   HCT 37.7 (L) 03/11/2019    MCV 80.6 03/11/2019   PLT 173 03/11/2019    BMET    Component Value Date/Time   NA 137 03/11/2019 0927   K 4.2 03/11/2019 0927   CL 104 03/11/2019 0927   CO2 26 03/11/2019 0927   GLUCOSE 106 (H) 03/11/2019 0927   BUN 23 (H) 05/23/2019 1059   CREATININE 0.95 05/23/2019 1059   CALCIUM 8.9 03/11/2019 0927   GFRNONAA >60 05/23/2019 1059   GFRAA >60 05/23/2019 1059   Estimated Creatinine Clearance: 77.3 mL/min (by C-G formula based on SCr of 0.95 mg/dL).  COAG Lab Results  Component Value Date   INR 1.01 06/17/2018   INR 1.11 06/08/2018   INR 1.30 05/01/2018    Radiology No results found.   Assessment/Plan Urothelial cancer (  [facility]) Being followed by urology as well as oncology.  This is a severe and difficult problem and would certainly impact his wound healing  Protein-calorie malnutrition, severe Poor nutrition impairs wound healing tremendously.  Increasing protein and caloric intake will be of benefit.  Swelling of limb Some sort of compression and potentially Unna boots will be helpful in trying to keep the swelling under control to help his wounds heal.  We will not put him in Unna boots until we do a more detailed arterial assessment.  Elevation of his legs would certainly be of benefit as well.  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  He has already undergone tibial revascularization on the left leg and is here today to address the right leg. We did discuss with the patient that this was a critical and limb threatening situation.    Leotis Pain, MD  05/30/2019 9:54 AM

## 2019-05-30 NOTE — OR Nursing (Signed)
Report called to [place] to [facility] health care

## 2019-06-08 ENCOUNTER — Non-Acute Institutional Stay: Payer: Medicaid Other | Admitting: Nurse Practitioner

## 2019-06-08 ENCOUNTER — Encounter: Payer: Self-pay | Admitting: Nurse Practitioner

## 2019-06-08 VITALS — BP 116/71 | HR 82 | Temp 98.0°F | Resp 18 | Wt 154.9 lb

## 2019-06-08 DIAGNOSIS — Z515 Encounter for palliative care: Secondary | ICD-10-CM

## 2019-06-08 DIAGNOSIS — G894 Chronic pain syndrome: Secondary | ICD-10-CM

## 2019-06-08 NOTE — Progress Notes (Signed)
[facility] Consult Note Telephone: [phone]  Fax: [phone]  PATIENT NAME: Chad [name] DOB: [birth year] MRN: [id number]  PRIMARY CARE PROVIDER:   [name], MD  REFERRING PROVIDER:  [name], MD [address] [place],  [place] [address]   PRIMARY CARE PROVIDER:   Dr Yves Dill PROVIDER: Dr Hodges/[facility] RESPONSIBLE PARTY:  Self  RECOMMENDATIONS and PLAN: 1.ACP: DNR; treat what is treatable including hospitalizations if necessary  2.Anorexia secondary to protein calorie malnutritionrelated to cancerimprovingongoing.Continue to monitor daily weights, supplements, supportive measures and courage to eat  3.Generalized weaknesssecondary to multiple chronic disease progression including anemia, late onset CVA, urothelial and colon cancer.Encourage energy conservation and rest times.  4.Chronic pain monitor on pain scale, monitor efficacy vs adverse side effects. Currently takingscheduled gabapentin; tylenol in addition to oxycodone 10mg  q6hrs/not at maximum usage; will continue to monitor; encouraged to ask when in pain, may consider MsContin with worsening wounds if not managed on maximized dose of Oxycodone; Continue to follow by psychotherapy and psychiatry for counseling; supportive services.  5. Palliative care encounter; Palliative medicine team will continue to support patient, patient's family, and medical team. Visit consisted of counseling and education dealing with the complex and emotionally intense issues of symptom management and palliative care in the setting of serious and potentially life-threatening illness  I spent 40 minutes providing this consultation,  from 2:20pm to 3:00pm. More than 50% of the time in this consultation was spent coordinating communication.   HISTORY OF PRESENT ILLNESS:  Chad [name] is a 58 y.o. year old male with multiple medical problems  including late onset CVA, urothelial cancer, prostate cancer, chronic hepatitis C, hyperlipidemia, colon surgery, protein calorie malnutrition, anxiety, depression. 05/23/2019 LT lower extremity angio ASO w ulceration; 05/30/2019 RT lower extremity angio ASO w ulceration by Dr Lucky Cowboy. Mr. Hailey continues to reside in [place] at [facility]. He does transfer himself to the wheelchair and is able to perform some adl's. He does toilet himself. He feeds himself and appetite has been improved. He does verbalize his needs.  No recent hospitalizations. He did have a fall 10 / 19 / 2020 attempting to transfer to the bed with no noted injury. At present Mr. Geimer is sitting in the wheelchair in his room. He appears comfortable. No visitors present. I visited and observe Mr Loehrer. We talked about purpose for palliative care visit and he was in agreement. We talked about how his was feeling today. He replies that he is doing okay, little tired. We talked about symptoms of pain and shortness of breath which she currently is comfortable. We talked about his appetite which has improved. Mr. Jezewski does continue to go out to smoke daily. We talked about smoking cessation but he declined. We talked about medical goals of care to treat what is treatable though DNR does remain in place. We talked about his next Oncology visit which has been sometime. Discuss will follow up for further discussion of next chemotherapy treatment. We talked about role of palliative care and plan of care. Mr. Kolden does continue to appear stable at present time and will follow up in two months if needed or sooner should he decline. Mr Mulhearn in agreement. Therapeutic listening and emotional support provided. I have updated nursing staff new new changes to current goals or plan of care  Palliative Care was asked to help to continue to address goals of care.  CODE STATUS: DNR  PPS: 50%  HOSPICE ELIGIBILITY/DIAGNOSIS: TBD  PAST MEDICAL HISTORY:  Past Medical History:  Diagnosis Date  . Anemia   . Anxiety   . ARF (acute respiratory failure) ([place])   . Bladder cancer ([place])   . Cancer [facility])    prostate  . COPD (chronic obstructive pulmonary disease) ([place])   . Depression   . Dysphagia   . Family history of colon cancer   . Family history of kidney cancer   . Family history of leukemia   . Family history of prostate cancer   . GERD (gastroesophageal reflux disease)   . Hepatitis    chronic hep c  . Hydronephrosis   . Hyperlipidemia   . Knee pain    Left  . Malignant neoplasm of colon ([place])   . Nerve pain   . Peripheral vascular disease ([place])   . Stroke ([place])   . Urinary frequency     SOCIAL HX:  Social History   Tobacco Use  . Smoking status: Current Every Day Smoker    Packs/day: 1.00    Types: Cigarettes  . Smokeless tobacco: Never Used  Substance Use Topics  . Alcohol use: Not Currently    Frequency: Never    ALLERGIES:  Allergies  Allergen Reactions  . Penicillins Rash     PERTINENT MEDICATIONS:  Outpatient Encounter Medications as of 06/08/2019  Medication Sig  . acetaminophen (TYLENOL) 325 MG tablet Take 650 mg by mouth 3 (three) times daily.  Marland Kitchen aspirin EC 81 MG tablet Take 1 tablet (81 mg total) by mouth daily.  Marland Kitchen atorvastatin (LIPITOR) 10 MG tablet Take 1 tablet (10 mg total) by mouth daily.  . clopidogrel (PLAVIX) 75 MG tablet Take 1 tablet (75 mg total) by mouth daily.  . cyclobenzaprine (FLEXERIL) 10 MG tablet Take 10 mg by mouth at bedtime.  . diclofenac sodium (VOLTAREN) 1 % GEL Apply 2 g topically See admin instructions. Apply 2 g topically 4 times daily. Apply 2 g to left shoulder, apply 2 g to left hip and 2 g to left knee 4 times daily per [facility] instructions  . escitalopram (LEXAPRO) 5 MG tablet Take 5 mg by mouth daily.  Marland Kitchen gabapentin (NEURONTIN) 100 MG capsule Take 100 mg by mouth 3 (three) times daily.  . mirtazapine (REMERON)  7.5 MG tablet Take 7.5 mg by mouth at bedtime.  Marland Kitchen oxycodone (OXY-IR) 5 MG capsule Take 10 mg by mouth every 6 (six) hours as needed.   No facility-administered encounter medications on file as of 06/08/2019.     PHYSICAL EXAM:   General: pleasant, chronically ill, male Cardiovascular: regular rate and rhythm Pulmonary: clear ant fields Abdomen: soft, nontender, + bowel sounds Extremities: +BLE edema, no joint deformities Neurological: generalized weakness  Mariadelosang Wynns Ihor Gully, NP

## 2019-06-09 ENCOUNTER — Other Ambulatory Visit: Payer: Self-pay

## 2019-06-15 ENCOUNTER — Encounter: Payer: Self-pay | Admitting: Licensed Clinical Social Worker

## 2019-06-15 DIAGNOSIS — Z1509 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: Secondary | ICD-10-CM | POA: Insufficient documentation

## 2019-06-15 DIAGNOSIS — Z1501 Genetic susceptibility to malignant neoplasm of breast: Secondary | ICD-10-CM | POA: Insufficient documentation

## 2019-06-15 DIAGNOSIS — Z1379 Encounter for other screening for genetic and chromosomal anomalies: Secondary | ICD-10-CM | POA: Insufficient documentation

## 2019-06-15 DIAGNOSIS — Z1589 Genetic 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 disease: Secondary | ICD-10-CM | POA: Insufficient documentation

## 2019-06-16 ENCOUNTER — Other Ambulatory Visit: Payer: Self-pay

## 2019-06-16 ENCOUNTER — Inpatient Hospital Stay: Payer: Medicaid Other | Attending: Oncology | Admitting: Licensed Clinical Social Worker

## 2019-06-16 DIAGNOSIS — C189 Malignant neoplasm of colon, unspecified: Secondary | ICD-10-CM | POA: Insufficient documentation

## 2019-06-16 DIAGNOSIS — E785 Hyperlipidemia, unspecified: Secondary | ICD-10-CM | POA: Insufficient documentation

## 2019-06-16 DIAGNOSIS — R5383 Other fatigue: Secondary | ICD-10-CM | POA: Insufficient documentation

## 2019-06-16 DIAGNOSIS — F418 Other specified anxiety disorders: Secondary | ICD-10-CM | POA: Insufficient documentation

## 2019-06-16 DIAGNOSIS — Z800 Family history of malignant neoplasm of digestive organs: Secondary | ICD-10-CM

## 2019-06-16 DIAGNOSIS — C661 Malignant neoplasm of right ureter: Secondary | ICD-10-CM | POA: Insufficient documentation

## 2019-06-16 DIAGNOSIS — Z806 Family history of leukemia: Secondary | ICD-10-CM

## 2019-06-16 DIAGNOSIS — Z9221 Personal history of antineoplastic chemotherapy: Secondary | ICD-10-CM | POA: Insufficient documentation

## 2019-06-16 DIAGNOSIS — Z79899 Other long term (current) drug therapy: Secondary | ICD-10-CM | POA: Insufficient documentation

## 2019-06-16 DIAGNOSIS — B182 Chronic viral hepatitis C: Secondary | ICD-10-CM | POA: Insufficient documentation

## 2019-06-16 DIAGNOSIS — J449 Chronic obstructive pulmonary disease, unspecified: Secondary | ICD-10-CM | POA: Insufficient documentation

## 2019-06-16 DIAGNOSIS — K219 Gastro-esophageal reflux disease without esophagitis: Secondary | ICD-10-CM | POA: Insufficient documentation

## 2019-06-16 DIAGNOSIS — R5381 Other malaise: Secondary | ICD-10-CM | POA: Insufficient documentation

## 2019-06-16 DIAGNOSIS — Z5112 Encounter for antineoplastic immunotherapy: Secondary | ICD-10-CM | POA: Insufficient documentation

## 2019-06-16 DIAGNOSIS — Z791 Long term (current) use of non-steroidal anti-inflammatories (NSAID): Secondary | ICD-10-CM | POA: Insufficient documentation

## 2019-06-16 DIAGNOSIS — Z8042 Family history of malignant neoplasm of prostate: Secondary | ICD-10-CM | POA: Insufficient documentation

## 2019-06-16 DIAGNOSIS — Z8051 Family history of malignant neoplasm of kidney: Secondary | ICD-10-CM

## 2019-06-16 DIAGNOSIS — Z1379 Encounter for other screening for genetic and chromosomal anomalies: Secondary | ICD-10-CM

## 2019-06-16 DIAGNOSIS — Z8673 Personal history of transient ischemic attack (TIA), and cerebral infarction without residual deficits: Secondary | ICD-10-CM | POA: Insufficient documentation

## 2019-06-16 DIAGNOSIS — D509 Iron deficiency anemia, unspecified: Secondary | ICD-10-CM | POA: Insufficient documentation

## 2019-06-16 DIAGNOSIS — Z7902 Long term (current) use of antithrombotics/antiplatelets: Secondary | ICD-10-CM | POA: Insufficient documentation

## 2019-06-16 DIAGNOSIS — Z1509 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: Secondary | ICD-10-CM

## 2019-06-16 DIAGNOSIS — Z1501 Genetic susceptibility to malignant neoplasm of breast: Secondary | ICD-10-CM

## 2019-06-16 DIAGNOSIS — F17210 Nicotine dependence, cigarettes, uncomplicated: Secondary | ICD-10-CM | POA: Insufficient documentation

## 2019-06-16 DIAGNOSIS — Z7982 Long term (current) use of aspirin: Secondary | ICD-10-CM | POA: Insufficient documentation

## 2019-06-16 DIAGNOSIS — I739 Peripheral vascular disease, unspecified: Secondary | ICD-10-CM | POA: Insufficient documentation

## 2019-06-16 DIAGNOSIS — Z85038 Personal history of other malignant neoplasm of large intestine: Secondary | ICD-10-CM | POA: Insufficient documentation

## 2019-06-16 NOTE — Progress Notes (Signed)
Genetic Test Results  HPI:  [name] was previously seen in the [facility] due to a personal and family history of cancer and concerns regarding a hereditary predisposition to cancer. Please refer to our prior cancer genetics clinic note for more information regarding our discussion, assessment and recommendations, at the time. [name] recent genetic test results were disclosed to him, as were recommendations warranted by these results. These results and recommendations are discussed in more detail below.   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; # Colon ca- stage III  GOALS: palliative  CURRENT/MOST RECENT THERAPY: Keytruda    Urothelial cancer ([place])   Initial Diagnosis   Urothelial cancer ([place])   Ureteral cancer, right ([place])  05/26/2018 Initial Diagnosis   Ureteral cancer, right ([place])   06/21/2018 -  Chemotherapy   The patient had pembrolizumab (KEYTRUDA) 200 mg in sodium chloride 0.9 % 50 mL chemo infusion, 200 mg, Intravenous, Once, 13 of 14 cycles Administration: 200 mg (06/21/2018), 200 mg (07/12/2018), 200 mg (08/02/2018), 200 mg (08/23/2018), 200 mg (09/13/2018), 200 mg (10/04/2018), 200 mg (10/25/2018), 200 mg (11/22/2018), 200 mg (12/13/2018), 200 mg (01/05/2019), 200 mg (01/24/2019), 200 mg (02/14/2019), 200 mg (03/11/2019)  for chemotherapy treatment.      FAMILY HISTORY:  We obtained a detailed, 4-generation family history.  Significant diagnoses are listed below: Family History  Problem Relation Age of Onset   Prostate cancer Father 77   Cancer Brother 40   unsure type   Cancer Paternal Uncle        unsure type   Cancer Maternal Grandmother        unsure type   Cancer Paternal Grandmother        unsure type   Kidney cancer Paternal Grandfather    Cancer Other        unsure types   Leukemia Son    Cancer Son        other cancers, possibly colon   [name] has 2 sons, Chad [name], 29, and Chad [name], 3. He also has 2 daughters, Chad [name], 21 and Chad [name], 49. Chad [name] has had leukemia and other cancers, but patient is unsure which kinds. He believes he may have had colon cancer. [name] also had 2 brothers. His brother, Chad [name], passed at 22 from cancer, but patient is unsure what type. His other brother, Chad [name], is 28 and has not had cancer but may have had genetic testing.  [name] mother is living at 32, no cancers. He has one maternal uncle, no cancers. No cancers in cousins. Maternal grandmother died of cancer at 69 but he is unsure the type, he has no information about maternal grandfather.  [name] father had prostate cancer and died from it at 34. Patient had 2 paternal aunts, 1 paternal uncle, no known cancers. He does report cancers in paternal cousins but he does not know exactly who had cancer or what kinds. His paternal grandmother had cancer, unsure the type. Paternal grandfather had kidney cancer.   [name] is unaware of previous family history of genetic testing for hereditary cancer risks. There is  no reported Ashkenazi Jewish ancestry. There is no known consanguinity.  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 The remainder of the testing was negative.   The Common Hereditary Cancers Panel offered by [facility] includes sequencing and/or deletion duplication testing of the  following 59 genes: APC, ATM, AXIN2, BAP1, BARD1, BMPR1A, BRCA1, BRCA2, BRIP1, CDC73, CDH1, CDK4, CDKN1C, CDKN2A (p14ARF), CDKN2A (p16INK4a), CHEK2, CTNNA1, DICER1, DIS3L2, EPCAM*, FH, FLCN, GPC3, GREM1*, HOXB13, KIT, MEN1, MET, MLH1, MSH2, MSH3, MSH6, MUTYH, NBN, NF1, NTHL1, PALB2, PDGFRA, PMS2, POLD1, POLE, PTEN, RAD50, RAD51C, RAD51D, REST, RNF43, SDHA*, SDHB, SDHC, SDHD, SMAD4, SMARCA4, SMARCB1, STK11, TP53, TSC1, TSC2, VHL, WT1.   The test report has been scanned into EPIC and is located under the Molecular Pathology section of the Results Review tab.  A portion of the result report is included below for reference.     DISCUSSION: MSH2 Lynch Syndrome and CHEK2  We discussed that these results explain [name] personal and family history of cancer. We discussed Lynch syndrome and CHEK2 in detail, including cancer risks, management guidelines, and who needs testing.   MSH2 Clinical condition Lynch syndrome is characterized by an increased risk of colorectal cancer as well as cancers of the endometrium, ovary, prostate, stomach, small intestine, hepatobiliary tract, urinary tract, pancreas and brain. Lynch syndrome tumors typically demonstrate microsatellite instability (MSI) as well as loss of expression of the mismatch repair proteins on immunohistochemical (IHC) staining. This condition is caused by pathogenic variants in one of the mismatch repair genes--MLH1, MSH2, MSH6, PMS2--as well as deletions in the 3 end of the EPCAM gene.  See the table below from NCCN Genetic/Familial High Risk Assessment: Colorectal version 1.2020 for MSH2 specific risks:    Inheritance Lynch syndrome has autosomal dominant inheritance. This means that an individual with a pathogenic variant has a 50% chance of passing the condition on to their offspring. Once a pathogenic mutation is detected in an individual, it is possible to identify at-risk relatives who can pursue testing for this specific familial  variant. Many cases are inherited from a parent, but some can occur spontaneously (i.e., an individual with a pathogenic variant has parents who do not have it); however, that individual now has a 50% risk of passing it on to future offspring.  Additionally, individuals with a pathogenic variant in one of the MMR genes (MLH1, MSH2, MSH6, PMS2) are carriers of constitutional mismatch repair deficiency (CMMR-D). CMMR-D is a childhood-onset cancer predisposition syndrome that can present with hematological malignancies, cancers of the brain and central nervous system, Lynch syndrome-associated cancers (colon, uterine, small bowel, urinary tract), embryonic tumors, and sarcomas. Some affected individuals may also display some features of neurofibromatosis type 1--most often cafe-au-lait macules (PMID: 45809983, 38250539). For there to be a risk of CMMR-D in offspring, an individual and their partner would each have to have a single pathogenic variant in the same MMR gene; in such a case, the risk of having an affected child is 25%.  Management  Surveillance guidelines, as published by the [employer]  (NCCN), suggest the following for individuals with MSH2 Lynch syndrome (*NCCN. Genetic/Familial High-Risk Assessment: Colorectal. Version 1.2020):  Colon cancer -High-quality olonoscopy beginning at age 33-25 or 2-5 years prior to earliest colon cancer diagnosis -Repeat every 1-2 years -There are data to suggest aspirin 600 mg/daily for  at least 2 years may decrease risk of colon cancer in Lynch syndrome, decision to use aspirin should be made on individualized basis   -[name] currently has colon cancer  Endometrial cancer -Education on symptoms such as abnormal bleeding -Hysterectomy can be considered -Timing should be individualized based on when childbearing is complete, family history, comorbidities, and specific gene mutations. -Screening via endometrial biopsy  every 1-2 years can be considered starting at age 39-35. Transvaginal ultrasound may be considered at the physicians discretion.  Ovarian cancer -Bilateral salpingo-oophorectomy is a risk-reducing option that can be considered. -Timing should be individualized based on when childbearing is complete, family history, comorbidities, and specific gene mutations. - Women should be educated based on the signs and symptoms of ovarian cancer (such as pelvic or abdominal pain, bloating, early satiety, or urinary frequency or urgency). -Transvaginal ultrasound can be considered at clinicians discretion. -Consider risk reduction agents for endometrial and ovarian cancers  -While these risks will not affect [name], we discussed these would be important for any male relatives such as his daughters.   Gastric/small bowel cancer -There are no clear data to support surveillance for gastric, duodenal, and small bowel cancers in [place]. Select individuals with family history of these cancers or those of Asian descent may benefit from surveillance. - Risk factors include male sex, older age, MLH1 or MSH2 path variant, first degree relative with gastric cancer, Asian ethnicity, immigrant from countries with high background incidence of gastric cance. - If surveillance is performed, may consider upper endoscopy with visualization of the duodenum at the time of colonoscopy every 3-5 years beginning at 26. Consider testing and treating H. Pylori.  Urothelial cancer -Selected individuals such as those with family history of urothelial cancer or MSH2 mutation may want to consider screening. Surveillance options may include urinalysis starting at 30-35; however, there is insufficient evidence to recommend a surveillance strategy.  -[name] is currently being treated for this cancer  Central nervous system cancer -Consider annual physical/neurological examination starting at 25-30 years.  Pancreatic  cancer -Consider screening beginning at age 25 (or 49 years younger than earliest exocrine pancreatic cancer in family) for individuals with >1 first or second degree relative on same side of family as pathogenic mutation -For individuals considering pancreatic screening, annual contrast-enhanced MRI and/or EUS  -[name] does not report family history of pancreatic cancer  Prostate cancer -At present, insufficient evidence to recommend earlier or more frequent prostate cancer screening   CHEK2  Clinical condition The CHEK2 gene is associated with an increased risk for autosomal dominant adult-onset cancers, including breast, colon, thyroid, prostate, and possibly others. The risks of these cancers, particularly breast, have been determined to be both variant- and family history-dependent.  Inheritance Hereditary predisposition to cancer due to pathogenic variants in the CHEK2 gene has autosomal dominant inheritance. This means that an individual with a pathogenic variant has a 50% chance of passing the condition onto their offspring. This result allows for the identification of at-risk relatives who can pursue testing for this specific familial variant. Many cases are inherited from a parent, but some cases can occur spontaneously (i.e., an individual with a pathogenic variant has parents who do not have it)  Management Current screening guidelines from the [employer]  (NCCN) for those with a pathogenic CHEK2 variant are as follows:  Male breast cancer: -Annual mammogram with consideration of tomosynthesis; also consider breast MRI with contrast beginning at age 47 -Evidence of risk-reducing mastectomy is insufficient; manage  based on family history  -We discussed that this would be important for his daughters/male relatives  Colon cancer: -Colonoscopy screening every 5 years beginning at age 25 -If an individual has a first-degree relative with  colorectal cancer, screening should begin 10 years prior to the relatives age at diagnosis if before 59. -If an individual has a personal history of colorectal cancer, screening recommendations should be based on recommendations for post-colorectal cancer resection.  -His MSH2 mutation confers a higher risk for colon cancer, and should any family member be found to also have both MSH2 and CHEK2, management would be based on Lynch syndrome  It has been suggested that men with a CHEK2 pathogenic variant and a first-degree relative with prostate cancer have an annual prostate-specific antigen (PSA) analysis (PMID: 29191660). However, the benefits of screening for prostate cancer among men with a pathogenic variant in CHEK2 are uncertain.  An individuals cancer risk and medical management are not determined by genetic test results alone. Overall cancer risk assessment incorporates additional factors including personal medical history, family history as well as available genetic information that may result in a personalized plan for cancer prevention and surveillance.  FAMILY MEMBERS: It is important that all of [name] relatives (both men and women) know of the presence of this gene mutation. Site-specific genetic testing can sort out who in the family is at risk and who is not.   [name] children and brother have a 50% chance to have also inherited these mutations. [name] notes that his son, Chad [name], is the only family member who is local. He agreed to let me reach out to [place] and help coordinate testing for him. The rest of his family is in [place]. His daughter Chad [name] is currently in prison. He is not close with cousins on either side of his family. He reports that people in his family know that cancer runs in the family, but was unsure if anyone has had genetic testing. He believes his brother Chad [name] may have had genetic testing because he has talked about Lynch syndrome before.    PLAN:  1. These results will bemade availableto his referring provider, Dr. [name] and his PCP Dr. Humphrey Rolls.  He would like these providers to follow him long-term for this indication and coordinate screening.  2. [name] plans to discuss these results with his family. He will make Chad [name] aware that I'll be reaching out to him.   SUPPORT AND RESOURCES: If [name] is interested in Lynch-specific information and support, we gave him information on [employer] as well as [employer]. To locate genetic counselors in other cities, visit the website of the [employer] ([employer]) and Secretary/administrator for a Social worker by zip code.  We encouraged [name] to remain in contact with [place] on an annual basis so we can update his personal and family histories, and let him know of advances in cancer genetics that may benefit the family. Our contact number was provided. [name] questions were answered to his satisfaction today, and he knows he is welcome to call anytime with additional questions.   Faith Rogue, MS, [facility] Genetic Counselor [email or web address] Phone: [phone]

## 2019-06-17 ENCOUNTER — Other Ambulatory Visit: Payer: Self-pay

## 2019-06-17 ENCOUNTER — Inpatient Hospital Stay: Payer: Medicaid Other

## 2019-06-17 ENCOUNTER — Inpatient Hospital Stay (HOSPITAL_BASED_OUTPATIENT_CLINIC_OR_DEPARTMENT_OTHER): Payer: Medicaid Other | Admitting: Internal Medicine

## 2019-06-17 ENCOUNTER — Other Ambulatory Visit (INDEPENDENT_AMBULATORY_CARE_PROVIDER_SITE_OTHER): Payer: Self-pay | Admitting: Vascular Surgery

## 2019-06-17 DIAGNOSIS — C661 Malignant neoplasm of right ureter: Secondary | ICD-10-CM | POA: Diagnosis not present

## 2019-06-17 DIAGNOSIS — Z7902 Long term (current) use of antithrombotics/antiplatelets: Secondary | ICD-10-CM | POA: Diagnosis not present

## 2019-06-17 DIAGNOSIS — J449 Chronic obstructive pulmonary disease, unspecified: Secondary | ICD-10-CM | POA: Diagnosis not present

## 2019-06-17 DIAGNOSIS — C182 Malignant neoplasm of ascending colon: Secondary | ICD-10-CM

## 2019-06-17 DIAGNOSIS — Z9221 Personal history of antineoplastic chemotherapy: Secondary | ICD-10-CM | POA: Diagnosis not present

## 2019-06-17 DIAGNOSIS — K219 Gastro-esophageal reflux disease without esophagitis: Secondary | ICD-10-CM | POA: Diagnosis not present

## 2019-06-17 DIAGNOSIS — I70239 Atherosclerosis of native arteries of right leg with ulceration of unspecified site: Secondary | ICD-10-CM

## 2019-06-17 DIAGNOSIS — R5383 Other fatigue: Secondary | ICD-10-CM | POA: Diagnosis not present

## 2019-06-17 DIAGNOSIS — Z7189 Other specified counseling: Secondary | ICD-10-CM

## 2019-06-17 DIAGNOSIS — Z8042 Family history of malignant neoplasm of prostate: Secondary | ICD-10-CM | POA: Diagnosis not present

## 2019-06-17 DIAGNOSIS — F17210 Nicotine dependence, cigarettes, uncomplicated: Secondary | ICD-10-CM | POA: Diagnosis not present

## 2019-06-17 DIAGNOSIS — Z85038 Personal history of other malignant neoplasm of large intestine: Secondary | ICD-10-CM | POA: Diagnosis not present

## 2019-06-17 DIAGNOSIS — Z79899 Other long term (current) drug therapy: Secondary | ICD-10-CM | POA: Diagnosis not present

## 2019-06-17 DIAGNOSIS — I70249 Atherosclerosis of native arteries of left leg with ulceration of unspecified site: Secondary | ICD-10-CM

## 2019-06-17 DIAGNOSIS — B182 Chronic viral hepatitis C: Secondary | ICD-10-CM | POA: Diagnosis not present

## 2019-06-17 DIAGNOSIS — Z7982 Long term (current) use of aspirin: Secondary | ICD-10-CM | POA: Diagnosis not present

## 2019-06-17 DIAGNOSIS — Z9862 Peripheral vascular angioplasty status: Secondary | ICD-10-CM

## 2019-06-17 DIAGNOSIS — Z515 Encounter for palliative care: Secondary | ICD-10-CM

## 2019-06-17 DIAGNOSIS — Z791 Long term (current) use of non-steroidal anti-inflammatories (NSAID): Secondary | ICD-10-CM | POA: Diagnosis not present

## 2019-06-17 DIAGNOSIS — D509 Iron deficiency anemia, unspecified: Secondary | ICD-10-CM | POA: Diagnosis not present

## 2019-06-17 DIAGNOSIS — F418 Other specified anxiety disorders: Secondary | ICD-10-CM | POA: Diagnosis not present

## 2019-06-17 DIAGNOSIS — Z5112 Encounter for antineoplastic immunotherapy: Secondary | ICD-10-CM | POA: Diagnosis not present

## 2019-06-17 DIAGNOSIS — Z8673 Personal history of transient ischemic attack (TIA), and cerebral infarction without residual deficits: Secondary | ICD-10-CM | POA: Diagnosis not present

## 2019-06-17 DIAGNOSIS — I739 Peripheral vascular disease, unspecified: Secondary | ICD-10-CM | POA: Diagnosis not present

## 2019-06-17 DIAGNOSIS — E785 Hyperlipidemia, unspecified: Secondary | ICD-10-CM | POA: Diagnosis not present

## 2019-06-17 DIAGNOSIS — R5381 Other malaise: Secondary | ICD-10-CM | POA: Diagnosis not present

## 2019-06-17 DIAGNOSIS — C189 Malignant neoplasm of colon, unspecified: Secondary | ICD-10-CM | POA: Diagnosis not present

## 2019-06-17 LAB — CBC WITH DIFFERENTIAL/PLATELET
Abs Immature Granulocytes: 0.02 10*3/uL (ref 0.00–0.07)
Basophils Absolute: 0.1 10*3/uL (ref 0.0–0.1)
Basophils Relative: 1 %
Eosinophils Absolute: 0.5 10*3/uL (ref 0.0–0.5)
Eosinophils Relative: 6 %
HCT: 37.5 % — ABNORMAL LOW (ref 39.0–52.0)
Hemoglobin: 11.8 g/dL — ABNORMAL LOW (ref 13.0–17.0)
Immature Granulocytes: 0 %
Lymphocytes Relative: 17 %
Lymphs Abs: 1.3 10*3/uL (ref 0.7–4.0)
MCH: 25.4 pg — ABNORMAL LOW (ref 26.0–34.0)
MCHC: 31.5 g/dL (ref 30.0–36.0)
MCV: 80.6 fL (ref 80.0–100.0)
Monocytes Absolute: 0.5 10*3/uL (ref 0.1–1.0)
Monocytes Relative: 7 %
Neutro Abs: 5.3 10*3/uL (ref 1.7–7.7)
Neutrophils Relative %: 69 %
Platelets: 162 10*3/uL (ref 150–400)
RBC: 4.65 MIL/uL (ref 4.22–5.81)
RDW: 15.9 % — ABNORMAL HIGH (ref 11.5–15.5)
WBC: 7.7 10*3/uL (ref 4.0–10.5)
nRBC: 0 % (ref 0.0–0.2)

## 2019-06-17 LAB — COMPREHENSIVE METABOLIC PANEL
ALT: 31 U/L (ref 0–44)
AST: 34 U/L (ref 15–41)
Albumin: 4.1 g/dL (ref 3.5–5.0)
Alkaline Phosphatase: 106 U/L (ref 38–126)
Anion gap: 5 (ref 5–15)
BUN: 32 mg/dL — ABNORMAL HIGH (ref 6–20)
CO2: 24 mmol/L (ref 22–32)
Calcium: 8.7 mg/dL — ABNORMAL LOW (ref 8.9–10.3)
Chloride: 105 mmol/L (ref 98–111)
Creatinine, Ser: 1.04 mg/dL (ref 0.61–1.24)
GFR calc Af Amer: >60 mL/min (ref >60)
GFR calc non Af Amer: >60 mL/min (ref >60)
Glucose, Bld: 103 mg/dL — ABNORMAL HIGH (ref 70–99)
Potassium: 4.1 mmol/L (ref 3.5–5.1)
Sodium: 134 mmol/L — ABNORMAL LOW (ref 135–145)
Total Bilirubin: 0.6 mg/dL (ref 0.3–1.2)
Total Protein: 8.3 g/dL — ABNORMAL HIGH (ref 6.5–8.1)

## 2019-06-17 LAB — TSH: TSH: 2.255 u[IU]/mL (ref 0.350–4.500)

## 2019-06-17 MED ORDER — SODIUM CHLORIDE 0.9 % IV SOLN
200.0000 mg | Freq: Once | INTRAVENOUS | Status: AC
  Administered 2019-06-17: 11:00:00 200 mg via INTRAVENOUS
  Filled 2019-06-17: qty 8

## 2019-06-17 MED ORDER — HEPARIN SOD (PORK) LOCK FLUSH 100 UNIT/ML IV SOLN
500.0000 [IU] | Freq: Once | INTRAVENOUS | Status: AC
  Administered 2019-06-17: 12:00:00 500 [IU] via INTRAVENOUS
  Filled 2019-06-17: qty 5

## 2019-06-17 MED ORDER — SODIUM CHLORIDE 0.9 % IV SOLN
Freq: Once | INTRAVENOUS | Status: AC
  Administered 2019-06-17: 11:00:00 via INTRAVENOUS
  Filled 2019-06-17: qty 250

## 2019-06-17 MED ORDER — SODIUM CHLORIDE 0.9% FLUSH
10.0000 mL | Freq: Once | INTRAVENOUS | Status: AC
  Administered 2019-06-17: 10:00:00 10 mL via INTRAVENOUS
  Filled 2019-06-17: qty 10

## 2019-06-17 MED ORDER — HEPARIN SOD (PORK) LOCK FLUSH 100 UNIT/ML IV SOLN: 500.0000 [IU] | Freq: Once | INTRAVENOUS | Status: DC | PRN

## 2019-06-17 NOTE — Assessment & Plan Note (Addendum)
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 SEP 17th 2020-C/A/P- stable; slight ureteral thickening/small pelvic/retroperitoneal lymph nodes. Stable.   #Since patient has recovered from Covid infection-okay proceed with  [place] today. Labs today reviewed;  acceptable for treatment today; TSH pending.   #Colon cancer.  Stage IIII- STABLE  #Iron deficiency anemia-hemoglobin 12.7-stable  # Bilateral LE ulcers-s/p wound care evaluation; s/p PTCA with Dr.Dew.  Stable   #DISPOSITION:  Chad [name] today;  # follow up 3 weeks -MD labs-cbc/cmp/Keytruda; Dr.B

## 2019-06-17 NOTE — Progress Notes (Signed)
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; # Colon ca- stage III  GOALS: palliative  CURRENT/MOST RECENT THERAPY: Keytruda    Urothelial cancer ([place])   Initial Diagnosis   Urothelial cancer ([place])   Ureteral cancer, right ([place])  05/26/2018 Initial Diagnosis   Ureteral cancer, right ([place])   06/21/2018 -  Chemotherapy   The patient had pembrolizumab (KEYTRUDA) 200 mg in sodium chloride 0.9 % 50 mL chemo infusion, 200 mg, Intravenous, Once, 13 of 15 cycles Administration: 200 mg (06/21/2018), 200 mg (07/12/2018), 200 mg (08/02/2018), 200 mg (08/23/2018), 200 mg (09/13/2018), 200 mg (10/04/2018), 200 mg (10/25/2018), 200 mg (11/22/2018), 200 mg (12/13/2018), 200 mg (01/05/2019), 200 mg (01/24/2019), 200 mg (02/14/2019), 200 mg (03/11/2019)  for chemotherapy treatment.       HISTORY OF PRESENTING ILLNESS: Patient is a poor historian.  Is alone.  Chad [name] 58 y.o.  male above history of stage IV-ureteral cancer/with multiple co-morbidities currently on Keytruda is here for follow-up.   In the interim patient treatment was held because of Covid infection.  Patient states that he was minimally symptomatic from infection.  Patient also evaluated by vascular surgery for peripheral vascular disease.  S/p PTCA.  No blood in stools or black or stools.  Appetite improving.  Review of Systems  Constitutional: Positive for malaise/fatigue. Negative for  chills, diaphoresis, fever and weight loss.  HENT: Negative for nosebleeds and sore throat.   Eyes: Negative for double vision.  Respiratory: Positive for cough and shortness of breath. Negative for hemoptysis and wheezing.   Cardiovascular: Negative for chest pain, palpitations and orthopnea.  Gastrointestinal: Negative for abdominal pain, blood in stool, constipation, diarrhea, heartburn, melena, nausea and vomiting.  Genitourinary: Negative for dysuria, frequency and urgency.  Musculoskeletal: Positive for back pain and joint pain.  Skin: Negative.  Negative for itching and rash.  Neurological: Positive for focal weakness. Negative for dizziness, tingling, weakness and headaches.       Chronic left-sided weakness upper than lower extremity.  Endo/Heme/Allergies: Does not bruise/bleed easily.  Psychiatric/Behavioral: Negative for depression. The patient is not nervous/anxious and does not have insomnia.      MEDICAL HISTORY:  Past Medical History:  Diagnosis Date  . Anemia   . Anxiety   . ARF (acute respiratory failure) ([place])   . Bladder cancer ([place])   . Cancer [facility])    prostate  . COPD (chronic obstructive pulmonary disease) ([place])   . Depression   . Dysphagia   . Family history of colon cancer   . Family history of kidney cancer   . Family history of leukemia   . Family history of prostate cancer   . GERD (gastroesophageal reflux disease)   . Hepatitis    chronic hep c  . Hydronephrosis   . Hyperlipidemia   . Knee pain    Left  . Malignant neoplasm of colon ([place])   . Nerve pain   . Peripheral vascular disease ([place])   .  Stroke ([place])   . Urinary frequency     SURGICAL HISTORY: Past Surgical History:  Procedure Laterality Date  . COLON SURGERY     En bloc extended right hemicolectomy 07/2017  . CYSTOSCOPY W/ RETROGRADES Right 08/30/2018   Procedure: CYSTOSCOPY WITH RETROGRADE PYELOGRAM;  Surgeon: Hollice Espy, MD;  Location: [facility] ORS;  Service: Urology;  Laterality:  Right;  . CYSTOSCOPY WITH STENT PLACEMENT Right 04/25/2018   Procedure: CYSTOSCOPY WITH STENT PLACEMENT;  Surgeon: Hollice Espy, MD;  Location: [facility] ORS;  Service: Urology;  Laterality: Right;  . CYSTOSCOPY WITH STENT PLACEMENT Right 08/30/2018   Procedure: [place] WITH STENT Exchange;  Surgeon: Hollice Espy, MD;  Location: [facility] ORS;  Service: Urology;  Laterality: Right;  . CYSTOSCOPY WITH STENT PLACEMENT Right 03/07/2019   Procedure: CYSTOSCOPY WITH STENT Exchange;  Surgeon: Hollice Espy, MD;  Location: [facility] ORS;  Service: Urology;  Laterality: Right;  . LOWER EXTREMITY ANGIOGRAPHY Left 05/23/2019   Procedure: LOWER EXTREMITY ANGIOGRAPHY;  Surgeon: Algernon Huxley, MD;  Location: [place] [facility];  Service: Cardiovascular;  Laterality: Left;  . LOWER EXTREMITY ANGIOGRAPHY Right 05/30/2019   Procedure: LOWER EXTREMITY ANGIOGRAPHY;  Surgeon: Algernon Huxley, MD;  Location: [place] [facility];  Service: Cardiovascular;  Laterality: Right;  . PORTA CATH INSERTION N/A 02/28/2019   Procedure: PORTA CATH INSERTION;  Surgeon: Algernon Huxley, MD;  Location: [place] [facility];  Service: Cardiovascular;  Laterality: N/A;  . tumor removed       SOCIAL HISTORY: Social History   Socioeconomic History  . Marital status: Single    Spouse name: Not on file  . Number of children: Not on file  . Years of education: Not on file  . Highest education level: Not on file  Occupational History  . Not on file  Social Needs  . Financial resource strain: Not on file  . Food insecurity    Worry: Not on file    Inability: Not on file  . Transportation needs    Medical: Not on file    Non-medical: Not on file  Tobacco Use  . Smoking status: Current Every Day Smoker    Packs/day: 1.00    Types: Cigarettes  . Smokeless tobacco: Never Used  Substance and Sexual Activity  . Alcohol use: Not Currently    Frequency: Never  . Drug use: Not Currently  . Sexual activity: Not on file  Lifestyle  .  Physical activity    Days per week: Not on file    Minutes per session: Not on file  . Stress: Not on file  Relationships  . Social Herbalist on phone: Not on file    Gets together: Not on file    Attends religious service: Not on file    Active member of club or organization: Not on file    Attends meetings of clubs or organizations: Not on file    Relationship status: Not on file  . Intimate partner violence    Fear of current or ex partner: Not on file    Emotionally abused: Not on file    Physically abused: Not on file    Forced sexual activity: Not on file  Other Topics Concern  . Not on file  Social History Narrative    used to live [place]; moved  To [place]- end of April 2019; in Nursing home; 1pp/day; quit alcohol. 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        Family- dad-  prostate ca [at 31y]; brother- 76 died of prostate cancer; brother- 33- no cancers [[place]]; sonGerald Stabs [[place]];[name]-32y prostate ca [facility] [place]]; daughter- 40 [[place]]; another daughter 7 [[place]/addict]. will refer genetics counseling. Given MSI- abnormal; highly suspicious of Lynch syndrome.  Patient's son [name] aware of high possible lynch syndrome.    FAMILY HISTORY: Family History  Problem Relation Age of Onset  . Prostate cancer Father 55  . Cancer Brother 33       unsure type  . Cancer Paternal Uncle        unsure type  . Cancer Maternal Grandmother        unsure type  . Cancer Paternal Grandmother        unsure type  . Kidney cancer Paternal Grandfather   . Cancer Other        unsure types  . Leukemia Son   . Cancer Son        other cancers, possibly colon    ALLERGIES:  is allergic to penicillins.  MEDICATIONS:  Current Outpatient Medications  Medication Sig Dispense Refill  . acetaminophen (TYLENOL) 325 MG tablet Take 650 mg by mouth 3 (three) times daily.    Marland Kitchen aspirin EC 81 MG tablet Take 1 tablet (81 mg total) by mouth daily. 150 tablet 2  . atorvastatin (LIPITOR) 10 MG tablet  Take 1 tablet (10 mg total) by mouth daily. 30 tablet 11  . clopidogrel (PLAVIX) 75 MG tablet Take 1 tablet (75 mg total) by mouth daily. 30 tablet 11  . cyclobenzaprine (FLEXERIL) 10 MG tablet Take 10 mg by mouth at bedtime.    . diclofenac sodium (VOLTAREN) 1 % GEL Apply 2 g topically See admin instructions. Apply 2 g topically 4 times daily. Apply 2 g to left shoulder, apply 2 g to left hip and 2 g to left knee 4 times daily per [facility] instructions    . escitalopram (LEXAPRO) 5 MG tablet Take 5 mg by mouth daily.    Marland Kitchen gabapentin (NEURONTIN) 100 MG capsule Take 100 mg by mouth 3 (three) times daily.    . mirtazapine (REMERON) 7.5 MG tablet Take 7.5 mg by mouth at bedtime.    Marland Kitchen oxycodone (OXY-IR) 5 MG capsule Take 10 mg by mouth every 6 (six) hours as needed.     No current facility-administered medications for this visit.    Facility-Administered Medications Ordered in Other Visits  Medication Dose Route Frequency Provider Last Rate Last Dose  . heparin lock flush 100 unit/mL  500 Units Intravenous Once Charlaine Dalton R, MD          .  PHYSICAL EXAMINATION: ECOG PERFORMANCE STATUS: 1 - Symptomatic but completely ambulatory  Vitals:   06/17/19 0937  BP: (!) 133/99  Pulse: 87  Temp: (!) 97.3 F (36.3 C)   Filed Weights   06/17/19 0937  Weight: 159 lb (72.1 kg)    Physical Exam  Constitutional: He is oriented to person, place, and time.  In wheelchair. Cachectic.  Appears older than his stated age.  Alone.  HENT:  Head: Normocephalic and atraumatic.  Mouth/Throat: Oropharynx is clear and moist. No oropharyngeal exudate.  Eyes: Pupils are equal, round, and reactive to light.  Neck: Normal range of motion. Neck supple.  Cardiovascular: Normal rate and regular rhythm.  Pulmonary/Chest: No respiratory distress. He has no wheezes.  Abdominal: Soft. Bowel sounds are normal. He exhibits no distension and no mass. There is no abdominal tenderness. There is no rebound and no  guarding.  Musculoskeletal: Normal range of motion.        General: No tenderness or edema.  Neurological: He is oriented to person, place, and time.  Sleepy but easily arousable.  Chronic weakness left upper and lower extremity.  Skin: Skin is warm.  Bilateral lower extremity ulcerations noted.  Pulses intact bilaterally.  Psychiatric: Affect normal.     LABORATORY DATA:  I have reviewed the data as listed Lab Results  Component Value Date   WBC 7.7 06/17/2019   HGB 11.8 (L) 06/17/2019   HCT 37.5 (L) 06/17/2019   MCV 80.6 06/17/2019   PLT 162 06/17/2019   Recent Labs    02/14/19 0833 03/11/19 0927 05/23/19 1059 05/30/19 1103 06/17/19 0923  NA 137 137  --   --  134*  K 4.0 4.2  --   --  4.1  CL 103 104  --   --  105  CO2 26 26  --   --  24  GLUCOSE 101* 106*  --   --  103*  BUN 20 20 23* 19 32*  CREATININE 0.92 0.88 0.95 0.81 1.04  CALCIUM 9.0 8.9  --   --  8.7*  GFRNONAA >60 >60 >60 >60 >60  GFRAA >60 >60 >60 >60 >60  PROT 8.1 7.8  --   --  8.3*  ALBUMIN 3.6 3.6  --   --  4.1  AST 30 46*  --   --  34  ALT 23 45*  --   --  31  ALKPHOS 89 119  --   --  106  BILITOT 0.5 0.5  --   --  0.6    RADIOGRAPHIC STUDIES: I have personally reviewed the radiological images as listed and agreed with the findings in the report. No results found.  ASSESSMENT & PLAN:   Ureteral cancer, right ([place]) # High-grade urothelial cancer/cytology; likely of the right renal pelvis /upper ureter. SEP 17th 2020-C/A/P- stable; slight ureteral thickening/small pelvic/retroperitoneal lymph nodes. Stable.   #Since patient has recovered from Covid infection-okay proceed with  [place] today. Labs today reviewed;  acceptable for treatment today; TSH pending.   #Colon cancer.  Stage IIII- STABLE  #Iron deficiency anemia-hemoglobin 12.7-stable  # Bilateral LE ulcers-s/p wound care evaluation; s/p PTCA with Dr.Dew.  Stable   #DISPOSITION:  [name] today;  # follow up 3 weeks -MD  labs-cbc/cmp/Keytruda; Dr.B  All questions were answered. The patient knows to call the clinic with any problems, questions or concerns.    Cammie Sickle, MD 06/17/2019 10:37 AM

## 2019-06-27 ENCOUNTER — Ambulatory Visit (INDEPENDENT_AMBULATORY_CARE_PROVIDER_SITE_OTHER): Payer: Medicaid Other | Admitting: Nurse Practitioner

## 2019-06-27 ENCOUNTER — Other Ambulatory Visit: Payer: Self-pay

## 2019-06-27 ENCOUNTER — Ambulatory Visit (INDEPENDENT_AMBULATORY_CARE_PROVIDER_SITE_OTHER): Payer: Medicaid Other

## 2019-06-27 DIAGNOSIS — I70249 Atherosclerosis of native arteries of left leg with ulceration of unspecified site: Secondary | ICD-10-CM

## 2019-06-27 DIAGNOSIS — I70239 Atherosclerosis of native arteries of right leg with ulceration of unspecified site: Secondary | ICD-10-CM

## 2019-06-27 DIAGNOSIS — Z9862 Peripheral vascular angioplasty status: Secondary | ICD-10-CM | POA: Diagnosis not present

## 2019-07-08 ENCOUNTER — Inpatient Hospital Stay (HOSPITAL_BASED_OUTPATIENT_CLINIC_OR_DEPARTMENT_OTHER): Payer: Medicaid Other | Admitting: Internal Medicine

## 2019-07-08 ENCOUNTER — Inpatient Hospital Stay: Payer: Medicaid Other

## 2019-07-08 ENCOUNTER — Inpatient Hospital Stay: Payer: Medicaid Other | Attending: Oncology

## 2019-07-08 ENCOUNTER — Encounter (INDEPENDENT_AMBULATORY_CARE_PROVIDER_SITE_OTHER): Payer: Self-pay | Admitting: Vascular Surgery

## 2019-07-08 ENCOUNTER — Other Ambulatory Visit: Payer: Self-pay

## 2019-07-08 DIAGNOSIS — I739 Peripheral vascular disease, unspecified: Secondary | ICD-10-CM | POA: Insufficient documentation

## 2019-07-08 DIAGNOSIS — C661 Malignant neoplasm of right ureter: Secondary | ICD-10-CM | POA: Insufficient documentation

## 2019-07-08 DIAGNOSIS — Z9221 Personal history of antineoplastic chemotherapy: Secondary | ICD-10-CM | POA: Insufficient documentation

## 2019-07-08 DIAGNOSIS — B182 Chronic viral hepatitis C: Secondary | ICD-10-CM | POA: Insufficient documentation

## 2019-07-08 DIAGNOSIS — Z452 Encounter for adjustment and management of vascular access device: Secondary | ICD-10-CM | POA: Diagnosis not present

## 2019-07-08 DIAGNOSIS — Z8042 Family history of malignant neoplasm of prostate: Secondary | ICD-10-CM | POA: Insufficient documentation

## 2019-07-08 DIAGNOSIS — Z7982 Long term (current) use of aspirin: Secondary | ICD-10-CM | POA: Insufficient documentation

## 2019-07-08 DIAGNOSIS — Z791 Long term (current) use of non-steroidal anti-inflammatories (NSAID): Secondary | ICD-10-CM | POA: Diagnosis not present

## 2019-07-08 DIAGNOSIS — J449 Chronic obstructive pulmonary disease, unspecified: Secondary | ICD-10-CM | POA: Insufficient documentation

## 2019-07-08 DIAGNOSIS — M25569 Pain in unspecified knee: Secondary | ICD-10-CM | POA: Insufficient documentation

## 2019-07-08 DIAGNOSIS — R5383 Other fatigue: Secondary | ICD-10-CM | POA: Diagnosis not present

## 2019-07-08 DIAGNOSIS — Z85038 Personal history of other malignant neoplasm of large intestine: Secondary | ICD-10-CM | POA: Insufficient documentation

## 2019-07-08 DIAGNOSIS — E785 Hyperlipidemia, unspecified: Secondary | ICD-10-CM | POA: Insufficient documentation

## 2019-07-08 DIAGNOSIS — Z79899 Other long term (current) drug therapy: Secondary | ICD-10-CM | POA: Insufficient documentation

## 2019-07-08 DIAGNOSIS — M2550 Pain in unspecified joint: Secondary | ICD-10-CM | POA: Insufficient documentation

## 2019-07-08 DIAGNOSIS — G8929 Other chronic pain: Secondary | ICD-10-CM | POA: Insufficient documentation

## 2019-07-08 DIAGNOSIS — F418 Other specified anxiety disorders: Secondary | ICD-10-CM | POA: Insufficient documentation

## 2019-07-08 DIAGNOSIS — R5381 Other malaise: Secondary | ICD-10-CM | POA: Diagnosis not present

## 2019-07-08 DIAGNOSIS — K219 Gastro-esophageal reflux disease without esophagitis: Secondary | ICD-10-CM | POA: Diagnosis not present

## 2019-07-08 DIAGNOSIS — D509 Iron deficiency anemia, unspecified: Secondary | ICD-10-CM | POA: Diagnosis not present

## 2019-07-08 DIAGNOSIS — Z8673 Personal history of transient ischemic attack (TIA), and cerebral infarction without residual deficits: Secondary | ICD-10-CM | POA: Insufficient documentation

## 2019-07-08 DIAGNOSIS — F17210 Nicotine dependence, cigarettes, uncomplicated: Secondary | ICD-10-CM | POA: Diagnosis not present

## 2019-07-08 LAB — COMPREHENSIVE METABOLIC PANEL
ALT: 139 U/L — ABNORMAL HIGH (ref 0–44)
AST: 103 U/L — ABNORMAL HIGH (ref 15–41)
Albumin: 3.7 g/dL (ref 3.5–5.0)
Alkaline Phosphatase: 139 U/L — ABNORMAL HIGH (ref 38–126)
Anion gap: 10 (ref 5–15)
BUN: 23 mg/dL — ABNORMAL HIGH (ref 6–20)
CO2: 23 mmol/L (ref 22–32)
Calcium: 8.8 mg/dL — ABNORMAL LOW (ref 8.9–10.3)
Chloride: 103 mmol/L (ref 98–111)
Creatinine, Ser: 0.93 mg/dL (ref 0.61–1.24)
GFR calc Af Amer: >60 mL/min (ref >60)
GFR calc non Af Amer: >60 mL/min (ref >60)
Glucose, Bld: 128 mg/dL — ABNORMAL HIGH (ref 70–99)
Potassium: 4.2 mmol/L (ref 3.5–5.1)
Sodium: 136 mmol/L (ref 135–145)
Total Bilirubin: 0.5 mg/dL (ref 0.3–1.2)
Total Protein: 8.1 g/dL (ref 6.5–8.1)

## 2019-07-08 LAB — CBC WITH DIFFERENTIAL/PLATELET
Abs Immature Granulocytes: 0.04 10*3/uL (ref 0.00–0.07)
Basophils Absolute: 0.1 10*3/uL (ref 0.0–0.1)
Basophils Relative: 1 %
Eosinophils Absolute: 0.4 10*3/uL (ref 0.0–0.5)
Eosinophils Relative: 5 %
HCT: 37.9 % — ABNORMAL LOW (ref 39.0–52.0)
Hemoglobin: 11.8 g/dL — ABNORMAL LOW (ref 13.0–17.0)
Immature Granulocytes: 1 %
Lymphocytes Relative: 12 %
Lymphs Abs: 1 10*3/uL (ref 0.7–4.0)
MCH: 25.6 pg — ABNORMAL LOW (ref 26.0–34.0)
MCHC: 31.1 g/dL (ref 30.0–36.0)
MCV: 82.2 fL (ref 80.0–100.0)
Monocytes Absolute: 0.7 10*3/uL (ref 0.1–1.0)
Monocytes Relative: 8 %
Neutro Abs: 6.4 10*3/uL (ref 1.7–7.7)
Neutrophils Relative %: 73 %
Platelets: 230 10*3/uL (ref 150–400)
RBC: 4.61 MIL/uL (ref 4.22–5.81)
RDW: 15.7 % — ABNORMAL HIGH (ref 11.5–15.5)
WBC: 8.5 10*3/uL (ref 4.0–10.5)
nRBC: 0 % (ref 0.0–0.2)

## 2019-07-08 MED ORDER — HEPARIN SOD (PORK) LOCK FLUSH 100 UNIT/ML IV SOLN
500.0000 [IU] | Freq: Once | INTRAVENOUS | Status: AC
  Administered 2019-07-08: 500 [IU] via INTRAVENOUS

## 2019-07-08 MED ORDER — SODIUM CHLORIDE 0.9% FLUSH
10.0000 mL | INTRAVENOUS | Status: DC | PRN
  Administered 2019-07-08: 10 mL via INTRAVENOUS
  Filled 2019-07-08: qty 10

## 2019-07-08 NOTE — Assessment & Plan Note (Addendum)
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 SEP 17th 2020-C/A/P- stable; slight ureteral thickening/small pelvic/retroperitoneal lymph nodes.STABLE.   # HOLD  [name]. Labs today reviewed- elevated LFTs/see below. Will order CT scan/ prior to next visit.   # LFTs-slightly elevated- ? Keytruda vs.others-Hx of hepatitis C. HOLD steroids; monitor closely.  #Colon cancer.  Stage IIII- STABLE  #Iron deficiency anemia-hemoglobin 12.7-STABLE.   # Bilateral LE ulcers-s/p wound care evaluation; s/p PTCA with Dr.Dew. bil Arterial Dopp-wnl.STABLE.    #DISPOSITION:  # Meyer Russel today; de-access # follow up Jan 4th -MD labs-cbc/cmp/Keytruda;CT A/P - Dr.B

## 2019-07-08 NOTE — Progress Notes (Signed)
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; # Colon ca- stage III  GOALS: palliative  CURRENT/MOST RECENT THERAPY: Keytruda    Urothelial cancer ([place])   Initial Diagnosis   Urothelial cancer ([place])   Ureteral cancer, right ([place])  05/26/2018 Initial Diagnosis   Ureteral cancer, right ([place])   06/21/2018 -  Chemotherapy   The patient had pembrolizumab (KEYTRUDA) 200 mg in sodium chloride 0.9 % 50 mL chemo infusion, 200 mg, Intravenous, Once, 14 of 15 cycles Administration: 200 mg (06/21/2018), 200 mg (07/12/2018), 200 mg (08/02/2018), 200 mg (08/23/2018), 200 mg (09/13/2018), 200 mg (10/04/2018), 200 mg (10/25/2018), 200 mg (11/22/2018), 200 mg (12/13/2018), 200 mg (01/05/2019), 200 mg (01/24/2019), 200 mg (02/14/2019), 200 mg (03/11/2019), 200 mg (06/17/2019)  for chemotherapy treatment.       HISTORY OF PRESENTING ILLNESS: Patient is a poor historian.  Is alone.  Chad [name] 58 y.o.  male above history of stage IV-ureteral cancer/with multiple co-morbidities currently on Keytruda is here for follow-up.   Patient denies any nausea vomiting denies any blood in stools or black-colored stools.  Appetite is fair.  Chronic shortness of breath chronic cough.  Not any worse.  Chronic knee pain joint pains.  Review of Systems  Constitutional: Positive for malaise/fatigue. Negative for chills, diaphoresis, fever and weight loss.  HENT: Negative for  nosebleeds and sore throat.   Eyes: Negative for double vision.  Respiratory: Positive for cough and shortness of breath. Negative for hemoptysis and wheezing.   Cardiovascular: Negative for chest pain, palpitations and orthopnea.  Gastrointestinal: Negative for abdominal pain, blood in stool, constipation, diarrhea, heartburn, melena, nausea and vomiting.  Genitourinary: Negative for dysuria, frequency and urgency.  Musculoskeletal: Positive for back pain and joint pain.  Skin: Negative.  Negative for itching and rash.  Neurological: Positive for focal weakness. Negative for dizziness, tingling, weakness and headaches.       Chronic left-sided weakness upper than lower extremity.  Endo/Heme/Allergies: Does not bruise/bleed easily.  Psychiatric/Behavioral: Negative for depression. The patient is not nervous/anxious and does not have insomnia.      MEDICAL HISTORY:  Past Medical History:  Diagnosis Date  . Anemia   . Anxiety   . ARF (acute respiratory failure) ([place])   . Bladder cancer ([place])   . Cancer [facility])    prostate  . COPD (chronic obstructive pulmonary disease) ([place])   . Depression   . Dysphagia   . Family history of colon cancer   . Family history of kidney cancer   . Family history of leukemia   . Family history of prostate cancer   . GERD (gastroesophageal reflux disease)   . Hepatitis    chronic hep c  . Hydronephrosis   . Hyperlipidemia   . Knee pain    Left  . Malignant neoplasm of colon ([place])   . Nerve pain   . Peripheral vascular disease ([place])   . Stroke ([place])   . Urinary frequency  SURGICAL HISTORY: Past Surgical History:  Procedure Laterality Date  . COLON SURGERY     En bloc extended right hemicolectomy 07/2017  . CYSTOSCOPY W/ RETROGRADES Right 08/30/2018   Procedure: CYSTOSCOPY WITH RETROGRADE PYELOGRAM;  Surgeon: Hollice Espy, MD;  Location: [facility] ORS;  Service: Urology;  Laterality: Right;  . CYSTOSCOPY WITH STENT PLACEMENT Right 04/25/2018    Procedure: CYSTOSCOPY WITH STENT PLACEMENT;  Surgeon: Hollice Espy, MD;  Location: [facility] ORS;  Service: Urology;  Laterality: Right;  . CYSTOSCOPY WITH STENT PLACEMENT Right 08/30/2018   Procedure: [place] WITH STENT Exchange;  Surgeon: Hollice Espy, MD;  Location: [facility] ORS;  Service: Urology;  Laterality: Right;  . CYSTOSCOPY WITH STENT PLACEMENT Right 03/07/2019   Procedure: CYSTOSCOPY WITH STENT Exchange;  Surgeon: Hollice Espy, MD;  Location: [facility] ORS;  Service: Urology;  Laterality: Right;  . LOWER EXTREMITY ANGIOGRAPHY Left 05/23/2019   Procedure: LOWER EXTREMITY ANGIOGRAPHY;  Surgeon: Algernon Huxley, MD;  Location: [place] [facility];  Service: Cardiovascular;  Laterality: Left;  . LOWER EXTREMITY ANGIOGRAPHY Right 05/30/2019   Procedure: LOWER EXTREMITY ANGIOGRAPHY;  Surgeon: Algernon Huxley, MD;  Location: [place] [facility];  Service: Cardiovascular;  Laterality: Right;  . PORTA CATH INSERTION N/A 02/28/2019   Procedure: PORTA CATH INSERTION;  Surgeon: Algernon Huxley, MD;  Location: [place] [facility];  Service: Cardiovascular;  Laterality: N/A;  . tumor removed       SOCIAL HISTORY: Social History   Socioeconomic History  . Marital status: Single    Spouse name: Not on file  . Number of children: Not on file  . Years of education: Not on file  . Highest education level: Not on file  Occupational History  . Not on file  Tobacco Use  . Smoking status: Current Every Day Smoker    Packs/day: 1.00    Types: Cigarettes  . Smokeless tobacco: Never Used  Substance and Sexual Activity  . Alcohol use: Not Currently  . Drug use: Not Currently  . Sexual activity: Not on file  Other Topics Concern  . Not on file  Social History Narrative    used to live [place]; moved  To [place]- end of April 2019; in Nursing home; 1pp/day; quit alcohol. 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        Family- dad- prostate ca [at 45y]; brother- 39 died of prostate cancer; brother- 62- no cancers [[place]];  sonGerald Stabs [];[name]-32y prostate ca [facility] [place]]; daughter- 63 [[place]]; another daughter 63 [NM/addict]. will refer genetics counseling. Given MSI- abnormal; highly suspicious of Lynch syndrome.  Patient's son [name] aware of high possible lynch syndrome.   Social Determinants of Health   Financial Resource Strain:   . Difficulty of Paying Living Expenses: Not on file  Food Insecurity:   . Worried About Charity fundraiser in the Last Year: Not on file  . Ran Out of Food in the Last Year: Not on file  Transportation Needs:   . Lack of Transportation (Medical): Not on file  . Lack of Transportation (Non-Medical): Not on file  Physical Activity:   . Days of Exercise per Week: Not on file  . Minutes of Exercise per Session: Not on file  Stress:   . Feeling of Stress : Not on file  Social Connections:   . Frequency of Communication with Friends and Family: Not on file  . Frequency of Social Gatherings with Friends and Family: Not on file  . Attends Religious Services: Not  on file  . Active Member of Clubs or Organizations: Not on file  . Attends Archivist Meetings: Not on file  . Marital Status: Not on file  Intimate Partner Violence:   . Fear of Current or Ex-Partner: Not on file  . Emotionally Abused: Not on file  . Physically Abused: Not on file  . Sexually Abused: Not on file    FAMILY HISTORY: Family History  Problem Relation Age of Onset  . Prostate cancer Father 34  . Cancer Brother 87       unsure type  . Cancer Paternal Uncle        unsure type  . Cancer Maternal Grandmother        unsure type  . Cancer Paternal Grandmother        unsure type  . Kidney cancer Paternal Grandfather   . Cancer Other        unsure types  . Leukemia Son   . Cancer Son        other cancers, possibly colon    ALLERGIES:  is allergic to penicillins.  MEDICATIONS:  Current Outpatient Medications  Medication Sig Dispense Refill  . acetaminophen (TYLENOL) 325 MG  tablet Take 650 mg by mouth 3 (three) times daily.    Marland Kitchen aspirin EC 81 MG tablet Take 1 tablet (81 mg total) by mouth daily. 150 tablet 2  . atorvastatin (LIPITOR) 10 MG tablet Take 1 tablet (10 mg total) by mouth daily. 30 tablet 11  . clopidogrel (PLAVIX) 75 MG tablet Take 1 tablet (75 mg total) by mouth daily. 30 tablet 11  . cyclobenzaprine (FLEXERIL) 10 MG tablet Take 10 mg by mouth at bedtime.    . diclofenac sodium (VOLTAREN) 1 % GEL Apply 2 g topically See admin instructions. Apply 2 g topically 4 times daily. Apply 2 g to left shoulder, apply 2 g to left hip and 2 g to left knee 4 times daily per [facility] instructions    . escitalopram (LEXAPRO) 5 MG tablet Take 5 mg by mouth daily.    Marland Kitchen gabapentin (NEURONTIN) 100 MG capsule Take 100 mg by mouth 3 (three) times daily.    . mirtazapine (REMERON) 7.5 MG tablet Take 7.5 mg by mouth at bedtime.    Marland Kitchen oxycodone (OXY-IR) 5 MG capsule Take 10 mg by mouth every 6 (six) hours as needed.     No current facility-administered medications for this visit.   Facility-Administered Medications Ordered in Other Visits  Medication Dose Route Frequency Provider Last Rate Last Admin  . sodium chloride flush (NS) 0.9 % injection 10 mL  10 mL Intravenous PRN Cammie Sickle, MD   10 mL at 07/08/19 0910      .  PHYSICAL EXAMINATION: ECOG PERFORMANCE STATUS: 1 - Symptomatic but completely ambulatory  Vitals:   07/08/19 0924  BP: (!) 118/96  Pulse: 83  Resp: 16  Temp: 98.1 F (36.7 C)  SpO2: 94%   Filed Weights   07/08/19 0924  Weight: 135 lb 12.8 oz (61.6 kg)    Physical Exam  Constitutional: He is oriented to person, place, and time.  In wheelchair. Cachectic.  Appears older than his stated age.  Alone.  HENT:  Head: Normocephalic and atraumatic.  Mouth/Throat: Oropharynx is clear and moist. No oropharyngeal exudate.  Eyes: Pupils are equal, round, and reactive to light.  Cardiovascular: Normal rate and regular rhythm.   Pulmonary/Chest: No respiratory distress. He has no wheezes.  Abdominal: Soft. Bowel sounds are  normal. He exhibits no distension and no mass. There is no abdominal tenderness. There is no rebound and no guarding.  Musculoskeletal:        General: No tenderness or edema. Normal range of motion.     Cervical back: Normal range of motion and neck supple.  Neurological: He is oriented to person, place, and time.  Sleepy but easily arousable.  Chronic weakness left upper and lower extremity.  Skin: Skin is warm.  Bilateral lower extremity ulcerations noted.  Pulses intact bilaterally.  Psychiatric: Affect normal.     LABORATORY DATA:  I have reviewed the data as listed Lab Results  Component Value Date   WBC 8.5 07/08/2019   HGB 11.8 (L) 07/08/2019   HCT 37.9 (L) 07/08/2019   MCV 82.2 07/08/2019   PLT 230 07/08/2019   Recent Labs    03/11/19 0927 05/30/19 1103 06/17/19 0923 07/08/19 0910  NA 137  --  134* 136  K 4.2  --  4.1 4.2  CL 104  --  105 103  CO2 26  --  24 23  GLUCOSE 106*  --  103* 128*  BUN 20 19 32* 23*  CREATININE 0.88 0.81 1.04 0.93  CALCIUM 8.9  --  8.7* 8.8*  GFRNONAA >60 >60 >60 >60  GFRAA >60 >60 >60 >60  PROT 7.8  --  8.3* 8.1  ALBUMIN 3.6  --  4.1 3.7  AST 46*  --  34 103*  ALT 45*  --  31 139*  ALKPHOS 119  --  106 139*  BILITOT 0.5  --  0.6 0.5    RADIOGRAPHIC STUDIES: I have personally reviewed the radiological images as listed and agreed with the findings in the report. ABI  Result Date: 06/28/2019 LOWER EXTREMITY DOPPLER STUDY  Vascular Interventions: 05/23/2019 PTA left PTA.                         05/30/2019 PTA right distal PTA. Comparison Study: 04/27/2019 Performing Technologist: Charlane Ferretti RT (R)(VS)  Examination Guidelines: A complete evaluation includes at minimum, Doppler waveform signals and systolic blood pressure reading at the level of bilateral brachial, anterior tibial, and posterior tibial arteries, when vessel segments are  accessible. Bilateral testing is considered an integral part of a complete examination. Photoelectric Plethysmograph (PPG) waveforms and toe systolic pressure readings are included as required and additional duplex testing as needed. Limited examinations for reoccurring indications may be performed as noted.  ABI Findings: +---------+------------------+-----+--------+--------+ Right    Rt Pressure (mmHg)IndexWaveformComment  +---------+------------------+-----+--------+--------+ Brachial 148                                     +---------+------------------+-----+--------+--------+ ATA      157               1.06 biphasic         +---------+------------------+-----+--------+--------+ PTA      155               1.05 biphasic         +---------+------------------+-----+--------+--------+ Great Toe100               0.68 Normal           +---------+------------------+-----+--------+--------+ +---------+------------------+-----+----------+-------+ Left     Lt Pressure (mmHg)IndexWaveform  Comment +---------+------------------+-----+----------+-------+ Brachial 133                                      +---------+------------------+-----+----------+-------+  ATA      161               1.09 monophasic        +---------+------------------+-----+----------+-------+ PTA      134               0.91 monophasic        +---------+------------------+-----+----------+-------+ Great Toe103               0.70 Normal            +---------+------------------+-----+----------+-------+ +-------+-----------+-----------+------------+------------+ ABI/TBIToday's ABIToday's TBIPrevious ABIPrevious TBI +-------+-----------+-----------+------------+------------+ Right  1.06       .68        1.17        .62          +-------+-----------+-----------+------------+------------+ Left   1.09       .70        1.02        .84           +-------+-----------+-----------+------------+------------+ Bilateral ABIs appear essentially unchanged compared to prior study on 04/27/2019. Bilateral TBIs appear essentially unchanged compared to prior study on 04/27/2019.  Summary: Right: Resting right ankle-brachial index is within normal range. No evidence of significant right lower extremity arterial disease. The right toe-brachial index is abnormal. Left: Resting left ankle-brachial index is within normal range. No evidence of significant left lower extremity arterial disease. The left toe-brachial index is normal.  *See table(s) above for measurements and observations.  Electronically signed by Leotis Pain MD on 06/28/2019 at 5:00:00 PM.   Final     ASSESSMENT & PLAN:   Ureteral cancer, right ([place]) # High-grade urothelial cancer/cytology; likely of the right renal pelvis /upper ureter. SEP 17th 2020-C/A/P- stable; slight ureteral thickening/small pelvic/retroperitoneal lymph nodes.STABLE.   # HOLD  [name]. Labs today reviewed- elevated LFTs/see below. Will order CT scan/ prior to next visit.   # LFTs-slightly elevated- ? Keytruda vs.others-Hx of hepatitis C. HOLD steroids; monitor closely.  #Colon cancer.  Stage IIII- STABLE  #Iron deficiency anemia-hemoglobin 12.7-STABLE.   # Bilateral LE ulcers-s/p wound care evaluation; s/p PTCA with Dr.Dew. bil Arterial Dopp-wnl.STABLE.    #DISPOSITION:  # Meyer Russel today; de-access # follow up Jan 4th -MD labs-cbc/cmp/Keytruda;CT A/P - Dr.B  All questions were answered. The patient knows to call the clinic with any problems, questions or concerns.    Cammie Sickle, MD 07/08/2019 10:47 AM

## 2019-07-19 IMAGING — MR MR HEAD W/O CM
10 of 11 series · 37 of 48 positions shown · non-contrast
Comparison: Head CT without contrast 04/25/2018.

ADDENDUM:
Study discussed by telephone with Dr. Argenis Ry in the ICU on
04/29/2018 at 2780 hours.
CLINICAL DATA: 57-year-old male found down outside. Suspected
sepsis. Ataxia.

EXAM:
MRI HEAD WITHOUT CONTRAST
TECHNIQUE: Multiplanar, multiecho pulse sequences of the brain and surrounding
structures were obtained without intravenous contrast.

[Series 5: ax dwi_tracew · axial · 3.0mm · 0.73mm/px · z∈[-14,+134]mm · 5 of 51 slices shown]
[im 1/51]
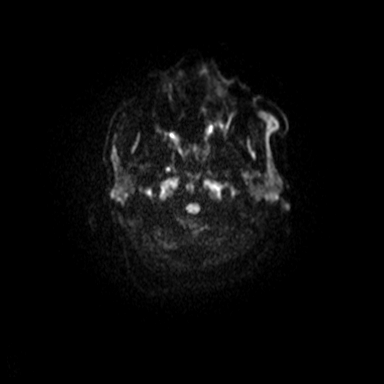
[im 13/51]
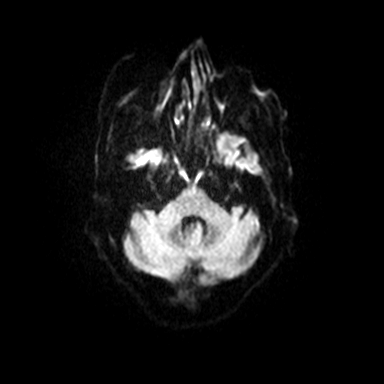
[im 26/51]
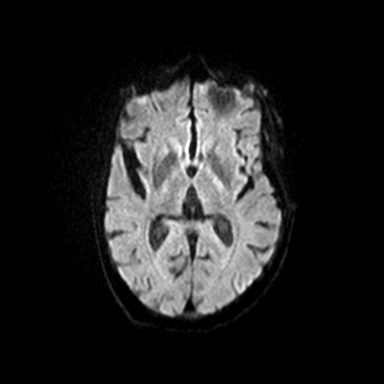
[im 38/51]
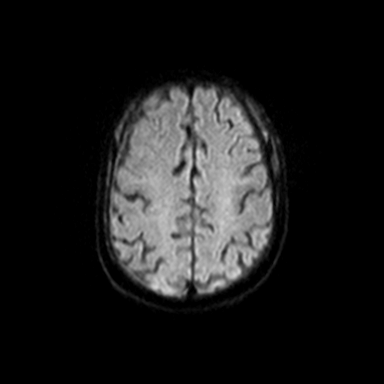
[im 51/51]
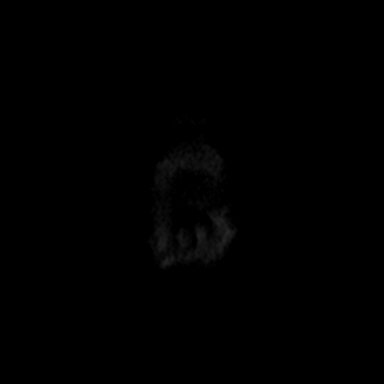

[Series 6: ax dwi_adc · axial · 3.0mm · 0.73mm/px · z∈[-14,+134]mm · 4 of 51 slices shown]
[im 1/51]
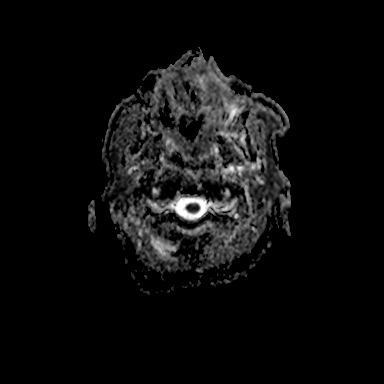
[im 17/51]
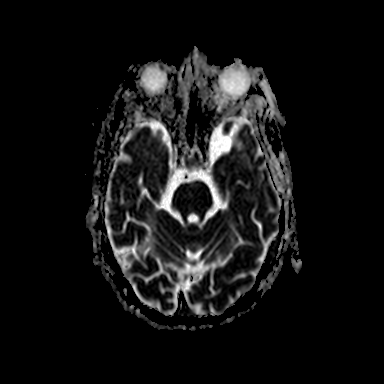
[im 34/51]
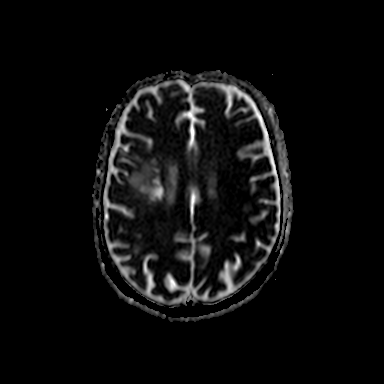
[im 51/51]
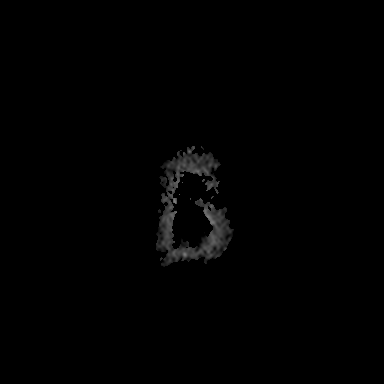

[Series 7: cor dwi_tracew · coronal · 5.0mm · 0.60mm/px · 3 of 38 slices shown]
[im 1/38]
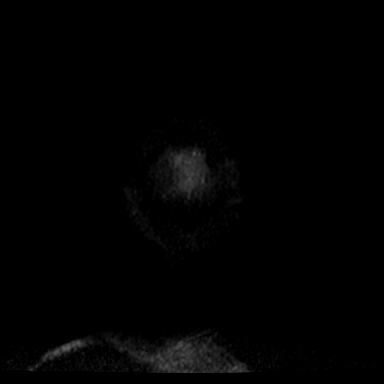
[im 19/38]
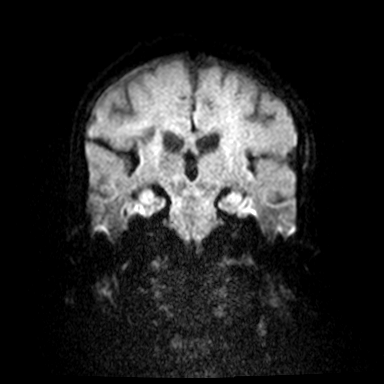
[im 38/38]
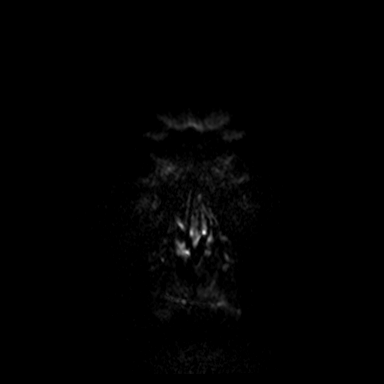

[Series 8: cor dwi_adc · coronal · 5.0mm · 0.60mm/px · 3 of 36 slices shown]
[im 1/36]
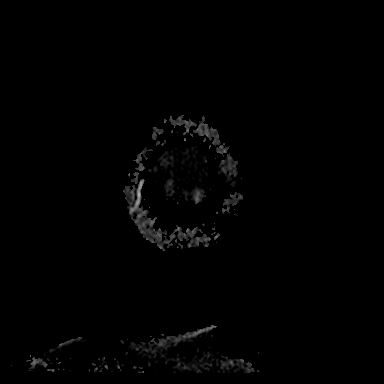
[im 18/36]
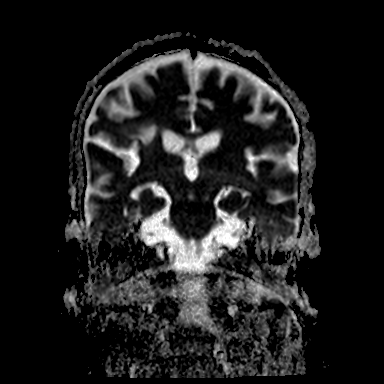
[im 36/36]
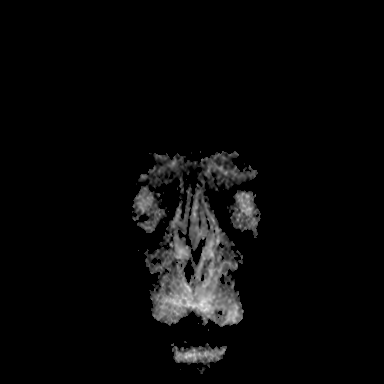

[Series 9: T1 · sagittal · 5.0mm · 0.62mm/px · 2 of 20 slices shown (1 of 2)]
[im 1/20]
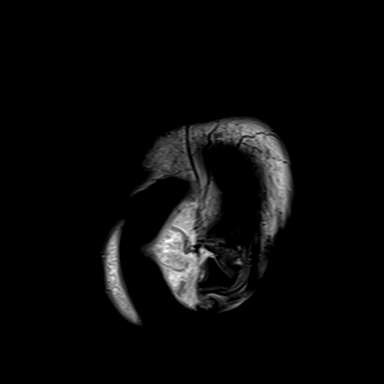
[im 20/20]
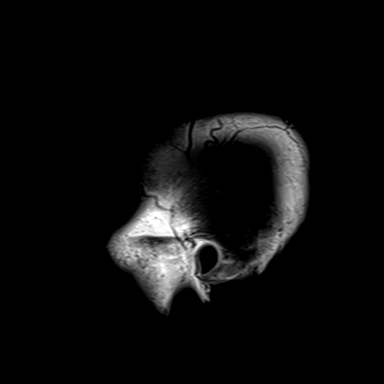

[Series 10: T2 · axial · 5.0mm · 0.53mm/px · z∈[-14,+129]mm · 2 of 25 slices shown (1 of 2)]
[im 1/25]
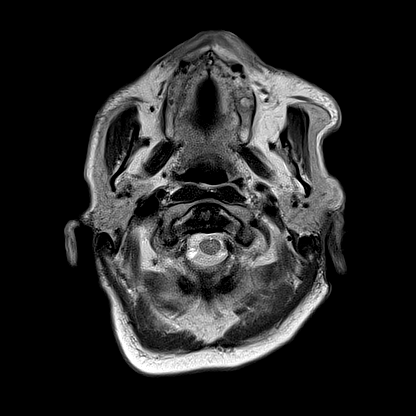
[im 25/25]
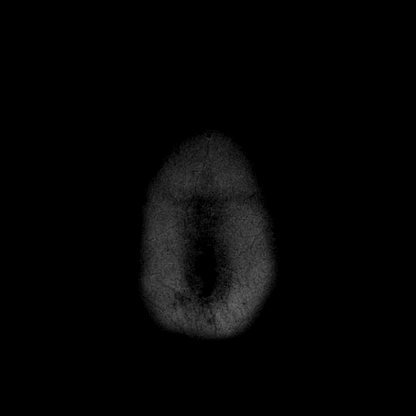

[Series 11: swi_images · axial · 3.0mm · 0.90mm/px · z∈[-30,+56]mm · 3 of 60 slices shown]
[im 1/60]
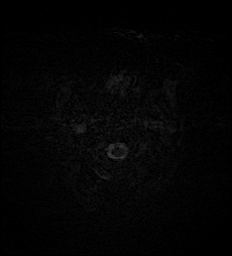
[im 15/60]
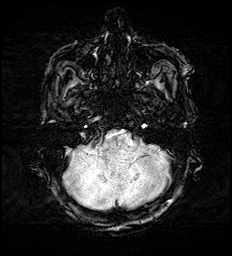
[im 30/60]
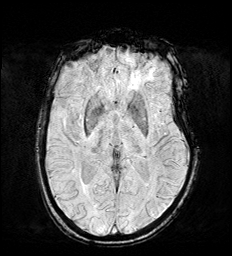

[Series 13: FLAIR · axial · 3.0mm · 0.53mm/px · z∈[-23,+138]mm · 5 of 55 slices shown]
[im 1/55]
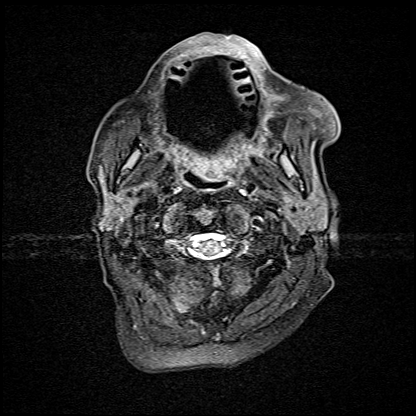
[im 14/55]
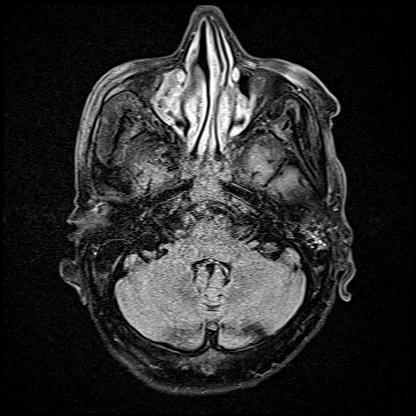
[im 28/55]
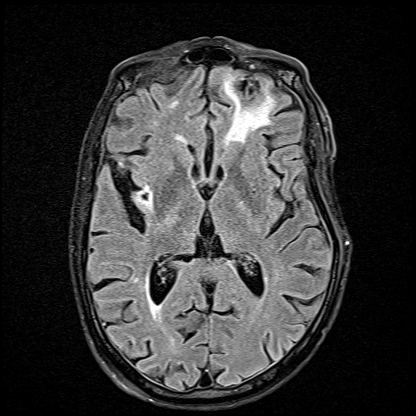
[im 41/55]
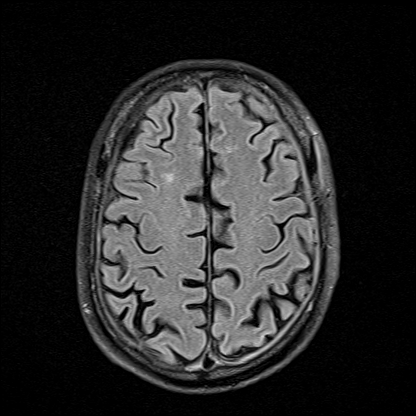
[im 55/55]
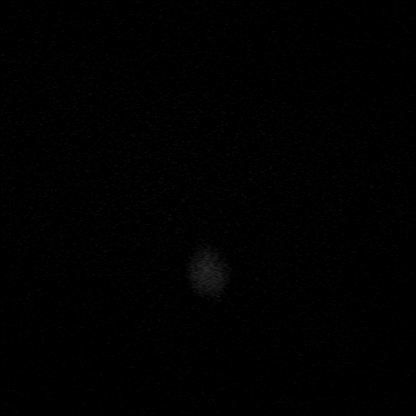

[Series 14: T1 · axial · 1.0mm · 0.98mm/px · z∈[-14,+128]mm · 8 of 144 slices shown (2 of 2)]
[im 1/144]
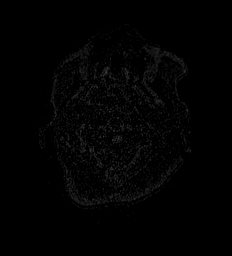
[im 27/144]
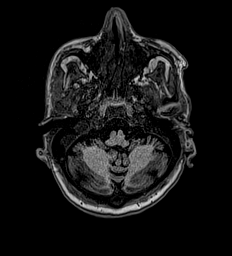
[im 40/144]
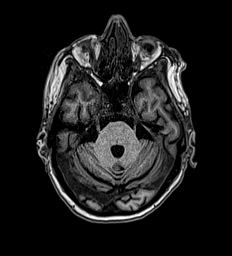
[im 66/144]
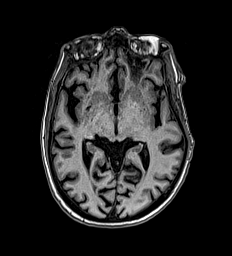
[im 79/144]
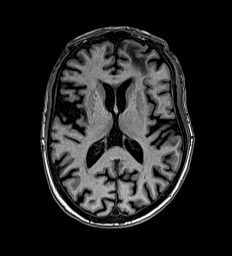
[im 105/144]
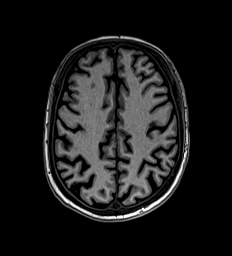
[im 118/144]
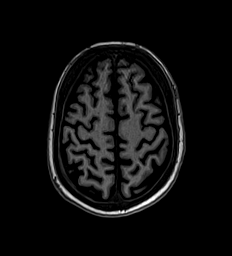
[im 144/144]
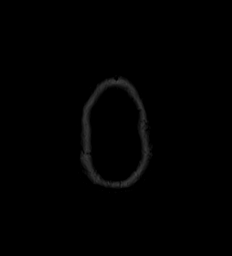

[Series 15: T2 · coronal · 5.0mm · 0.57mm/px · 2 of 29 slices shown (2 of 2)]
[im 1/29]
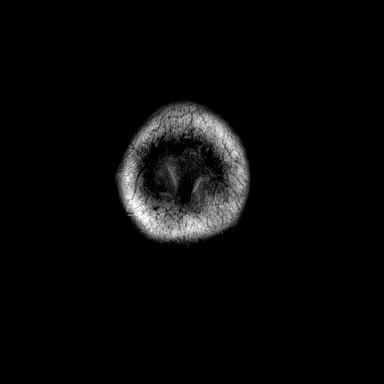
[im 29/29]
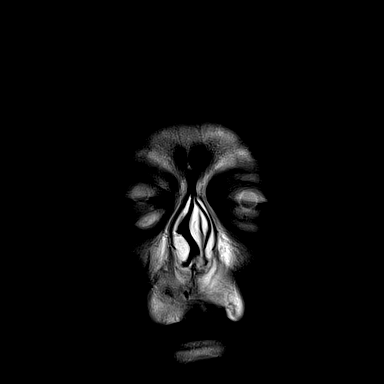

[37 of 48 positions shown; findings below may reference images not displayed]

FINDINGS: Brain: Chronic encephalomalacia in the left inferior frontal gyrus
and right operculum. Superimposed superficial siderosis along the
right hemisphere, most pronounced in the parietal lobe (series 11,
image 44) and at the operculum (image 36). Scattered small micro
hemorrhages in both hemispheres, including concentrated at the
splenium of the corpus callosum (image 33). Evidence of some
Wallerian degeneration in the brainstem including at the midbrain
(series 10, image 10) and at the right medullary pyramid (image 4).

Additionally there are small bilateral subdural hematomas, slightly
larger in the left hemisphere measuring up to 4 millimeters in
thickness. These were not apparent on the recent CT. No midline
shift. No significant intracranial mass effect.

No restricted diffusion to suggest acute infarction. Scattered
bilateral nonspecific cerebral white matter T2 and FLAIR
hyperintensity superimposed on the encephalomalacia described
earlier. No chronic blood products in the posterior fossa. No
evidence of mass lesion, ventriculomegaly. Cervicomedullary junction
and pituitary are within normal limits.

Vascular: Major intracranial vascular flow voids are preserved.

Skull and upper cervical spine: Negative visible cervical spine.
Nonspecific mildly decreased T1 bone marrow signal diffusely. No
destructive osseous lesion identified.

Sinuses/Orbits: Normal orbits soft tissues. Chronic paranasal sinus
periosteal thickening plus mild mucosal thickening. The fluid level
in the right sphenoid sinus has regressed.

Other: Mild left mastoid effusion. Trace right mastoid fluid.
Negative nasopharynx. Scalp and face soft tissues appear negative.
IMPRESSION: 1. No acute infarct, but there are small bilateral Subdural
Hematomas. That on the left is slightly larger measuring up to 4
millimeters in thickness. No associated intracranial mass effect.

2. Chronic encephalomalacia in the bilateral frontal lobes could be
ischemic and/or posttraumatic.
There are scattered chronic micro-hemorrhages in both hemispheres,
including in the splenium of the corpus callosum which could be seen
as the sequelae of trauma.
There is mild superficial siderosis in the right hemisphere.
Some Wallerian degeneration is evident in the right brainstem.

3. Acute on chronic paranasal sinus disease. Mild likely
postinflammatory mastoid fluid.

## 2019-07-20 IMAGING — DX DG ABD PORTABLE 1V
1 series · 1 of 1 positions shown · non-contrast
Comparison: CT 04/25/2018

CLINICAL DATA: NG tube placement

EXAM:
PORTABLE ABDOMEN - 1 VIEW

[abdomen supine]
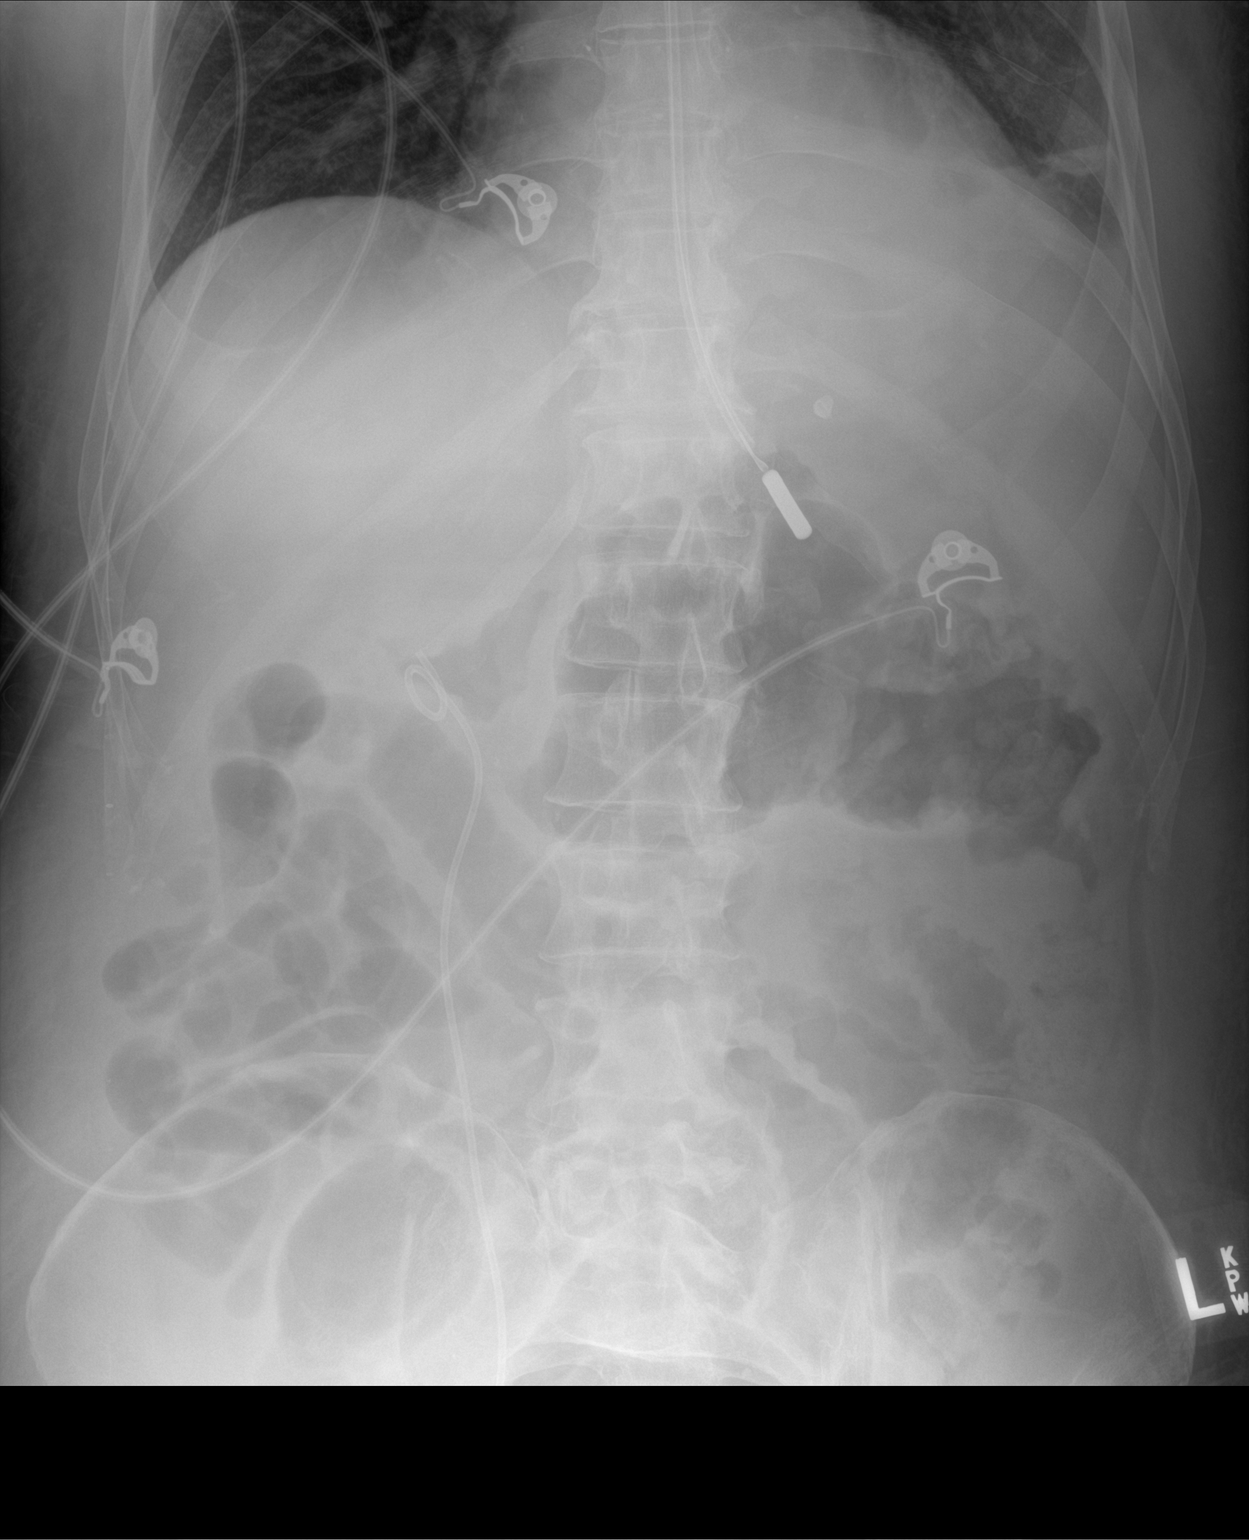

[1 of 1 positions shown; findings below may reference images not displayed]

FINDINGS: Left basilar consolidation. Esophageal tube tip projects over GE
junction. Mild diffuse gaseous enlargement of the bowel.
IMPRESSION: Esophageal tube tip overlies the GE junction.

Consolidation at the left lung base.

Mild diffuse increased bowel gas throughout, possible ileus

## 2019-07-20 IMAGING — DX DG ABD PORTABLE 1V
1 series · 1 of 1 positions shown · non-contrast
Comparison: 04/30/2018

CLINICAL DATA: NG tube placement

EXAM:
PORTABLE ABDOMEN - 1 VIEW

[abdomen supine]
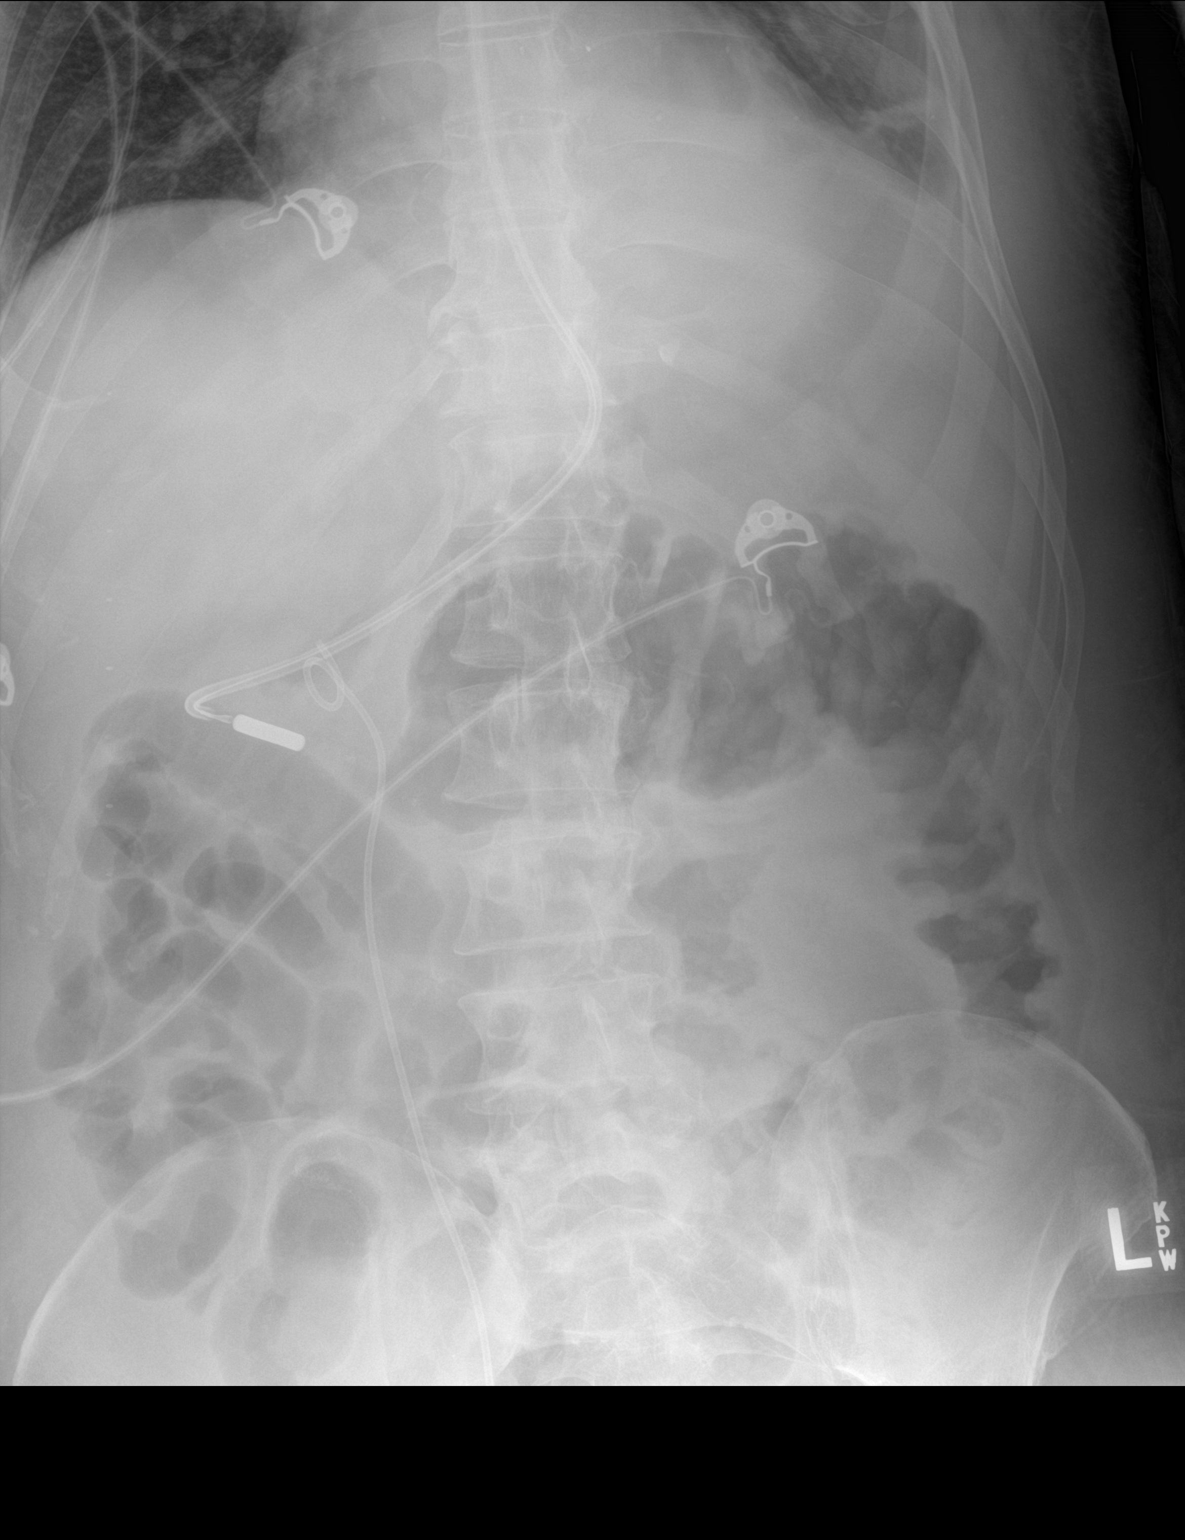

[1 of 1 positions shown; findings below may reference images not displayed]

FINDINGS: Esophageal tube tip has been advanced, somewhat abrupt turn to the
left distally. Consolidation at the left base. Right-sided ureteral
stent. Mild diffuse increased small and large bowel gas.
IMPRESSION: 1. Esophageal tube tip with slightly abrupt turn to the left
distally, not sure if this represents kinking of the tube at the
gastric outlet or projection of the tube over the proximal duodenum.
2. Consolidation at the left base.
3. Mild diffuse increased bowel gas, possible ileus

## 2019-07-28 ENCOUNTER — Ambulatory Visit: Admission: RE | Admit: 2019-07-28 | Payer: Medicaid Other | Source: Ambulatory Visit

## 2019-08-01 ENCOUNTER — Inpatient Hospital Stay: Payer: Medicaid Other | Admitting: Internal Medicine

## 2019-08-01 ENCOUNTER — Inpatient Hospital Stay: Payer: Medicaid Other

## 2019-08-05 ENCOUNTER — Telehealth: Payer: Self-pay | Admitting: *Deleted

## 2019-08-05 NOTE — Telephone Encounter (Signed)
Received phone call from [facility] in patient scheduling. She was contacted by the Ms. Valera Castle at [facility] - to cnl pt's ct scans.  patient tested positive covid 19 today. Test performed at facility. ([phone]).     Future apts next week for tx cnl by Nira Conn, RN. Colette could you contact [place] [facility] to inform the facility that these apts in clinic are also cnl. Thanks,

## 2019-08-08 ENCOUNTER — Ambulatory Visit: Admission: RE | Admit: 2019-08-08 | Payer: Medicaid Other | Source: Ambulatory Visit

## 2019-08-09 ENCOUNTER — Telehealth: Payer: Self-pay | Admitting: *Deleted

## 2019-08-09 NOTE — Telephone Encounter (Signed)
Chad [name] at [phone]).. She confirmed that patient DOES NOT [place]. He has not been tested since October 2020. He was positive in October. No recent positive test. Chad [name] stated that she was given the wrong patient's name to cnl apts.  Dr. [name] informed. Scheduling will r/s pt's apts.

## 2019-08-10 ENCOUNTER — Other Ambulatory Visit: Payer: Medicaid Other

## 2019-08-10 ENCOUNTER — Inpatient Hospital Stay: Payer: Medicaid Other

## 2019-08-10 ENCOUNTER — Inpatient Hospital Stay: Payer: Medicaid Other | Admitting: Internal Medicine

## 2019-08-12 ENCOUNTER — Other Ambulatory Visit: Payer: Self-pay

## 2019-08-12 ENCOUNTER — Encounter: Payer: Self-pay | Admitting: Nurse Practitioner

## 2019-08-12 ENCOUNTER — Non-Acute Institutional Stay: Payer: Medicaid Other | Admitting: Nurse Practitioner

## 2019-08-12 VITALS — BP 110/70 | HR 89 | Temp 97.6°F | Resp 18 | Wt 151.9 lb

## 2019-08-12 DIAGNOSIS — C61 Malignant neoplasm of prostate: Secondary | ICD-10-CM

## 2019-08-12 DIAGNOSIS — Z515 Encounter for palliative care: Secondary | ICD-10-CM

## 2019-08-12 NOTE — Progress Notes (Signed)
[facility] Consult Note Telephone: [phone]  Fax: [phone]  PATIENT NAME: Chad [name] DOB: 1961-02-21 MRN: [id number]    PRIMARY CARE PROVIDER:Dr Yves Dill PROVIDER:Dr Hodges/[facility] [place] RESPONSIBLE PARTY:Self  RECOMMENDATIONS and PLAN: 1.ACP: DNR; treat what is treatable including hospitalizations if necessary  2.Anorexia secondary to protein calorie malnutritionrelated to cancerimprovingongoing.Continue to monitor daily weights, supplements, supportive measures and courage to eat  3.Generalized weaknesssecondary to multiple chronic disease progression including anemia, late onset CVA, urothelial and colon cancer.Encourage energy conservation and rest times.  4.Chronic pain monitor on pain scale, monitor efficacy vs adverse side effects. Currently takingscheduled gabapentin; tylenol in addition to oxycodone 10mg  q6hrs/not at maximum usage; will continue to monitor; encouraged to ask when in pain, may consider MsContin with worsening wounds if not managed on maximized dose of Oxycodone; Continue to follow by psychotherapy and psychiatry for counseling; supportive services.  5.Palliative care encounter; Palliative medicine team will continue to support patient, patient's family, and medical team. Visit consisted of counseling and education dealing with the complex and emotionally intense issues of symptom management and palliative care in the setting of serious and potentially life-threatening illness  I spent 35 minutes providing this consultation,  from  12:45 pmto 1:20pm. More than 50% of the time in this consultation was spent coordinating communication.   HISTORY OF PRESENT ILLNESS:  Chad [name] is a 59 y.o. year old male with multiple medical problems including late onset CVA, urothelial cancer, prostate cancer, chronic hepatitis C, hyperlipidemia, colon surgery, protein  calorie malnutrition, anxiety, depression. Chad [name] continues to reside at [place] at [facility]. He does transfer to the wheelchair in his mobile as he wishes. Chad [name] does require assistance with ADLs though is mostly independent. Chad [name] does toilet himself. Chad [name] does feed himself an appetite varies fair to poor. Chad [name] is also followed by Psychiatry with last date of service 12/10 / 2024 depression continued on Remeron and escitalopram. No changes at this visit. Chad [name] has continued to be followed by wound care at the facility with last date of service 70 / 24 / 2020 for left mal med venous 4.5 x 2 x 0.2. Chad [name] continues to go to the smoking patio and a client smoking cessation. No recent Falls, hospitalizations. Chad [name] is able to verbalize his needs. At present Chad [name] is sitting in his room in a wheelchair. He is able to transfer himself and go where he wishes. We talked about purpose of palliative care visit in Chad [name] in agreement. We talked about how he was feeling. Chad [name] endorses that he is doing okay. He has not been to the cancer center since prior to covid-19 pandemic. Chad [name] endorses that isolation has made it difficult. He has a new roommate which seems to be working out better. We talked about symptoms of pain, which he describes as manageable. We talked about his appetite, encouraged him to eat. We talked about functional mobility. We talked about smoking and he declined cessation. We talked about medical goals of care. We talked about role of palliative care and plan of care. Discuss that will follow up in two months if needed or sooner if declines. Chad [name] an agreement. I have updated nursing staff many changes to current goals are plan of care. Therapeutic listening and emotional support provided. Questions answered.  Oncology plan from 07/08/2019 Ureteral cancer, right  [facility]) # High-grade urothelial  cancer/cytology; likely of the right renal pelvis /upper ureter. SEP 17th 2020-C/A/P- stable; slight ureteral thickening/small pelvic/retroperitoneal lymph nodes.STABLE.  HOLD  [place]. Labs today reviewed- elevated LFTs/see below. Will order CT scan/ prior to next visit.  LFTs-slightly elevated- ? Keytruda vs.others-Hx of hepatitis C. HOLD steroids; monitor closely.  #Colon cancer.  Stage IIII- STABLE  Palliative Care was asked to help to continue to address goals of care.   CODE STATUS: DNR  PPS: 50% HOSPICE ELIGIBILITY/DIAGNOSIS: TBD  PAST MEDICAL HISTORY:  Past Medical History:  Diagnosis Date  . Anemia   . Anxiety   . ARF (acute respiratory failure) ([place])   . Bladder cancer ([place])   . Cancer [facility])    prostate  . COPD (chronic obstructive pulmonary disease) ([place])   . Depression   . Dysphagia   . Family history of colon cancer   . Family history of kidney cancer   . Family history of leukemia   . Family history of prostate cancer   . GERD (gastroesophageal reflux disease)   . Hepatitis    chronic hep c  . Hydronephrosis   . Hyperlipidemia   . Knee pain    Left  . Malignant neoplasm of colon ([place])   . Nerve pain   . Peripheral vascular disease ([place])   . Stroke ([place])   . Urinary frequency     SOCIAL HX:  Social History   Tobacco Use  . Smoking status: Current Every Day Smoker    Packs/day: 1.00    Types: Cigarettes  . Smokeless tobacco: Never Used  Substance Use Topics  . Alcohol use: Not Currently    ALLERGIES:  Allergies  Allergen Reactions  . Penicillins Rash     PERTINENT MEDICATIONS:  Outpatient Encounter Medications as of 08/12/2019  Medication Sig  . acetaminophen (TYLENOL) 325 MG tablet Take 650 mg by mouth 3 (three) times daily.  Marland Kitchen aspirin EC 81 MG tablet Take 1 tablet (81 mg total) by mouth daily.  Marland Kitchen atorvastatin (LIPITOR) 10 MG tablet Take 1 tablet (10 mg total) by mouth daily.  . clopidogrel (PLAVIX) 75 MG  tablet Take 1 tablet (75 mg total) by mouth daily.  . cyclobenzaprine (FLEXERIL) 10 MG tablet Take 10 mg by mouth at bedtime.  . diclofenac sodium (VOLTAREN) 1 % GEL Apply 2 g topically See admin instructions. Apply 2 g topically 4 times daily. Apply 2 g to left shoulder, apply 2 g to left hip and 2 g to left knee 4 times daily per [facility] instructions  . escitalopram (LEXAPRO) 5 MG tablet Take 5 mg by mouth daily.  Marland Kitchen gabapentin (NEURONTIN) 100 MG capsule Take 100 mg by mouth 3 (three) times daily.  . mirtazapine (REMERON) 7.5 MG tablet Take 7.5 mg by mouth at bedtime.  Marland Kitchen oxycodone (OXY-IR) 5 MG capsule Take 10 mg by mouth every 6 (six) hours as needed.   No facility-administered encounter medications on file as of 08/12/2019.    PHYSICAL EXAM:   General: NAD, frail appearing, chronically ill male Cardiovascular: regular rate and rhythm Pulmonary: clear ant fields Abdomen: soft, nontender, + bowel sounds Extremities: mild BLE edema, no joint deformities Neurological: w/c dependent  Janus Vlcek Ihor Gully, NP

## 2019-08-24 ENCOUNTER — Other Ambulatory Visit: Payer: Self-pay

## 2019-08-24 ENCOUNTER — Ambulatory Visit
Admission: RE | Admit: 2019-08-24 | Discharge: 2019-08-24 | Disposition: A | Discharge status: Home / Self Care | Payer: Medicaid Other | Source: Ambulatory Visit | Attending: Internal Medicine | Admitting: Internal Medicine

## 2019-08-24 DIAGNOSIS — C661 Malignant neoplasm of right ureter: Secondary | ICD-10-CM | POA: Diagnosis not present

## 2019-08-24 LAB — POCT I-STAT CREATININE: Creatinine, Ser: 1 mg/dL (ref 0.61–1.24)

## 2019-08-24 MED ORDER — IOHEXOL 300 MG/ML  SOLN
100.0000 mL | Freq: Once | INTRAMUSCULAR | Status: AC | PRN
  Administered 2019-08-24: 100 mL via INTRAVENOUS

## 2019-08-26 ENCOUNTER — Inpatient Hospital Stay: Payer: Medicaid Other

## 2019-08-26 ENCOUNTER — Other Ambulatory Visit: Payer: Self-pay

## 2019-08-26 ENCOUNTER — Inpatient Hospital Stay (HOSPITAL_BASED_OUTPATIENT_CLINIC_OR_DEPARTMENT_OTHER): Payer: Medicaid Other | Admitting: Internal Medicine

## 2019-08-26 ENCOUNTER — Inpatient Hospital Stay: Payer: Medicaid Other | Attending: Internal Medicine

## 2019-08-26 ENCOUNTER — Encounter: Payer: Self-pay | Admitting: Internal Medicine

## 2019-08-26 DIAGNOSIS — C189 Malignant neoplasm of colon, unspecified: Secondary | ICD-10-CM | POA: Diagnosis not present

## 2019-08-26 DIAGNOSIS — Z8673 Personal history of transient ischemic attack (TIA), and cerebral infarction without residual deficits: Secondary | ICD-10-CM | POA: Diagnosis not present

## 2019-08-26 DIAGNOSIS — R5381 Other malaise: Secondary | ICD-10-CM | POA: Insufficient documentation

## 2019-08-26 DIAGNOSIS — Z515 Encounter for palliative care: Secondary | ICD-10-CM

## 2019-08-26 DIAGNOSIS — F418 Other specified anxiety disorders: Secondary | ICD-10-CM | POA: Insufficient documentation

## 2019-08-26 DIAGNOSIS — D509 Iron deficiency anemia, unspecified: Secondary | ICD-10-CM | POA: Insufficient documentation

## 2019-08-26 DIAGNOSIS — R5383 Other fatigue: Secondary | ICD-10-CM | POA: Insufficient documentation

## 2019-08-26 DIAGNOSIS — Z7189 Other specified counseling: Secondary | ICD-10-CM

## 2019-08-26 DIAGNOSIS — B182 Chronic viral hepatitis C: Secondary | ICD-10-CM | POA: Insufficient documentation

## 2019-08-26 DIAGNOSIS — J449 Chronic obstructive pulmonary disease, unspecified: Secondary | ICD-10-CM | POA: Diagnosis not present

## 2019-08-26 DIAGNOSIS — Z9221 Personal history of antineoplastic chemotherapy: Secondary | ICD-10-CM | POA: Diagnosis not present

## 2019-08-26 DIAGNOSIS — C661 Malignant neoplasm of right ureter: Secondary | ICD-10-CM | POA: Diagnosis not present

## 2019-08-26 DIAGNOSIS — Z8042 Family history of malignant neoplasm of prostate: Secondary | ICD-10-CM | POA: Insufficient documentation

## 2019-08-26 DIAGNOSIS — F17210 Nicotine dependence, cigarettes, uncomplicated: Secondary | ICD-10-CM | POA: Diagnosis not present

## 2019-08-26 DIAGNOSIS — Z7902 Long term (current) use of antithrombotics/antiplatelets: Secondary | ICD-10-CM | POA: Diagnosis not present

## 2019-08-26 DIAGNOSIS — Z5112 Encounter for antineoplastic immunotherapy: Secondary | ICD-10-CM | POA: Insufficient documentation

## 2019-08-26 DIAGNOSIS — Z7982 Long term (current) use of aspirin: Secondary | ICD-10-CM | POA: Diagnosis not present

## 2019-08-26 DIAGNOSIS — I739 Peripheral vascular disease, unspecified: Secondary | ICD-10-CM | POA: Insufficient documentation

## 2019-08-26 DIAGNOSIS — Z95828 Presence of other vascular implants and grafts: Secondary | ICD-10-CM

## 2019-08-26 DIAGNOSIS — Z79899 Other long term (current) drug therapy: Secondary | ICD-10-CM | POA: Insufficient documentation

## 2019-08-26 LAB — COMPREHENSIVE METABOLIC PANEL
ALT: 41 U/L (ref 0–44)
AST: 44 U/L — ABNORMAL HIGH (ref 15–41)
Albumin: 3.7 g/dL (ref 3.5–5.0)
Alkaline Phosphatase: 117 U/L (ref 38–126)
Anion gap: 8 (ref 5–15)
BUN: 22 mg/dL — ABNORMAL HIGH (ref 6–20)
CO2: 24 mmol/L (ref 22–32)
Calcium: 8.7 mg/dL — ABNORMAL LOW (ref 8.9–10.3)
Chloride: 104 mmol/L (ref 98–111)
Creatinine, Ser: 1.09 mg/dL (ref 0.61–1.24)
GFR calc Af Amer: >60 mL/min (ref >60)
GFR calc non Af Amer: >60 mL/min (ref >60)
Glucose, Bld: 117 mg/dL — ABNORMAL HIGH (ref 70–99)
Potassium: 4.4 mmol/L (ref 3.5–5.1)
Sodium: 136 mmol/L (ref 135–145)
Total Bilirubin: 0.5 mg/dL (ref 0.3–1.2)
Total Protein: 8 g/dL (ref 6.5–8.1)

## 2019-08-26 LAB — CBC WITH DIFFERENTIAL/PLATELET
Abs Immature Granulocytes: 0.02 10*3/uL (ref 0.00–0.07)
Basophils Absolute: 0.1 10*3/uL (ref 0.0–0.1)
Basophils Relative: 1 %
Eosinophils Absolute: 0.5 10*3/uL (ref 0.0–0.5)
Eosinophils Relative: 6 %
HCT: 37.7 % — ABNORMAL LOW (ref 39.0–52.0)
Hemoglobin: 11.4 g/dL — ABNORMAL LOW (ref 13.0–17.0)
Immature Granulocytes: 0 %
Lymphocytes Relative: 20 %
Lymphs Abs: 1.5 10*3/uL (ref 0.7–4.0)
MCH: 24.4 pg — ABNORMAL LOW (ref 26.0–34.0)
MCHC: 30.2 g/dL (ref 30.0–36.0)
MCV: 80.6 fL (ref 80.0–100.0)
Monocytes Absolute: 0.6 10*3/uL (ref 0.1–1.0)
Monocytes Relative: 8 %
Neutro Abs: 5 10*3/uL (ref 1.7–7.7)
Neutrophils Relative %: 65 %
Platelets: 203 10*3/uL (ref 150–400)
RBC: 4.68 MIL/uL (ref 4.22–5.81)
RDW: 13.6 % (ref 11.5–15.5)
WBC: 7.7 10*3/uL (ref 4.0–10.5)
nRBC: 0 % (ref 0.0–0.2)

## 2019-08-26 MED ORDER — HEPARIN SOD (PORK) LOCK FLUSH 100 UNIT/ML IV SOLN
500.0000 [IU] | Freq: Once | INTRAVENOUS | Status: AC | PRN
  Administered 2019-08-26: 500 [IU]
  Filled 2019-08-26: qty 5

## 2019-08-26 MED ORDER — SODIUM CHLORIDE 0.9 % IV SOLN
Freq: Once | INTRAVENOUS | Status: AC
  Filled 2019-08-26: qty 250

## 2019-08-26 MED ORDER — SODIUM CHLORIDE 0.9 % IV SOLN
200.0000 mg | Freq: Once | INTRAVENOUS | Status: AC
  Administered 2019-08-26: 200 mg via INTRAVENOUS
  Filled 2019-08-26: qty 8

## 2019-08-26 MED ORDER — SODIUM CHLORIDE 0.9% FLUSH
10.0000 mL | Freq: Once | INTRAVENOUS | Status: AC
  Administered 2019-08-26: 10 mL via INTRAVENOUS
  Filled 2019-08-26: qty 10

## 2019-08-26 NOTE — Progress Notes (Signed)
Index NOTE  Patient Care Team: [name], MD as PCP - General (Internal Medicine)  CHIEF COMPLAINTS/PURPOSE OF CONSULTATION:  Urothelial cancer  #  Oncology History Overview Note  # SEP-OCT 2019-right renal pelvis/ ureteral [cytology positive HIGH grade urothelial carcinoma [Dr.Brandon]    # NOV 24th 2019-Keytruda [consent]  #Right ureteral obstruction status post stent placement  # JAN 2019- Right Colon ca [ T4N1]  [[facility]]; NO adjuvant therapy  # Hep C/ # stroke of left side/weakness-2018 Nov [NM]; active smoker  DIAGNOSIS: # 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; # Colon ca- stage III  GOALS: palliative  CURRENT/MOST RECENT THERAPY: Keytruda [C]    Urothelial cancer ([place])   Initial Diagnosis   Urothelial cancer ([place])   Ureteral cancer, right ([place])  05/26/2018 Initial Diagnosis   Ureteral cancer, right ([place])   06/21/2018 -  Chemotherapy   The patient had pembrolizumab (KEYTRUDA) 200 mg in sodium chloride 0.9 % 50 mL chemo infusion, 200 mg, Intravenous, Once, 15 of 17 cycles Administration: 200 mg (06/21/2018), 200 mg (07/12/2018), 200 mg (08/02/2018), 200 mg (08/23/2018), 200 mg (09/13/2018), 200 mg (10/04/2018), 200 mg (10/25/2018), 200 mg (11/22/2018), 200 mg (12/13/2018), 200 mg (01/05/2019), 200 mg (01/24/2019), 200 mg (02/14/2019), 200 mg (03/11/2019), 200 mg (06/17/2019)  for chemotherapy treatment.       HISTORY OF PRESENTING ILLNESS: Patient is a poor historian.  Is alone.  Oaklee Sunga 59 y.o.  male above history of stage IV-ureteral cancer/with multiple co-morbidities currently on Keytruda is here for follow-up/review results of the CT scan.  Patient immunotherapy was held last week because of elevated LFTs.  Patient denies any nausea vomiting.  Denies any abdominal pain.  Denies any new fevers or chills.  Patient continues to chronic shortness of breath/chronic cough.  Chronic joint pains.  Not any worse  Review of Systems   Constitutional: Positive for malaise/fatigue. Negative for chills, diaphoresis, fever and weight loss.  HENT: Negative for nosebleeds and sore throat.   Eyes: Negative for double vision.  Respiratory: Positive for cough and shortness of breath. Negative for hemoptysis and wheezing.   Cardiovascular: Negative for chest pain, palpitations and orthopnea.  Gastrointestinal: Negative for abdominal pain, blood in stool, constipation, diarrhea, heartburn, melena, nausea and vomiting.  Genitourinary: Negative for dysuria, frequency and urgency.  Musculoskeletal: Positive for back pain and joint pain.  Skin: Negative.  Negative for itching and rash.  Neurological: Positive for focal weakness. Negative for dizziness, tingling, weakness and headaches.       Chronic left-sided weakness upper than lower extremity.  Endo/Heme/Allergies: Does not bruise/bleed easily.  Psychiatric/Behavioral: Negative for depression. The patient is not nervous/anxious and does not have insomnia.      MEDICAL HISTORY:  Past Medical History:  Diagnosis Date  . Anemia   . Anxiety   . ARF (acute respiratory failure) ([place])   . Bladder cancer ([place])   . Cancer [facility])    prostate  . COPD (chronic obstructive pulmonary disease) ([place])   . Depression   . Dysphagia   . Family history of colon cancer   . Family history of kidney cancer   . Family history of leukemia   . Family history of prostate cancer   . GERD (gastroesophageal reflux disease)   . Hepatitis    chronic hep c  . Hydronephrosis   . Hyperlipidemia   . Knee pain    Left  . Malignant neoplasm of colon ([place])   . Nerve pain   .  Peripheral vascular disease ([place])   . Stroke ([place])   . Urinary frequency     SURGICAL HISTORY: Past Surgical History:  Procedure Laterality Date  . COLON SURGERY     En bloc extended right hemicolectomy 07/2017  . CYSTOSCOPY W/ RETROGRADES Right 08/30/2018   Procedure: CYSTOSCOPY WITH RETROGRADE PYELOGRAM;  Surgeon: Hollice Espy, MD;  Location: [facility] ORS;  Service: Urology;  Laterality: Right;  . CYSTOSCOPY WITH STENT PLACEMENT Right 04/25/2018   Procedure: CYSTOSCOPY WITH STENT PLACEMENT;  Surgeon: Hollice Espy, MD;  Location: [facility] ORS;  Service: Urology;  Laterality: Right;  . CYSTOSCOPY WITH STENT PLACEMENT Right 08/30/2018   Procedure: [place] WITH STENT Exchange;  Surgeon: Hollice Espy, MD;  Location: [facility] ORS;  Service: Urology;  Laterality: Right;  . CYSTOSCOPY WITH STENT PLACEMENT Right 03/07/2019   Procedure: CYSTOSCOPY WITH STENT Exchange;  Surgeon: Hollice Espy, MD;  Location: [facility] ORS;  Service: Urology;  Laterality: Right;  . LOWER EXTREMITY ANGIOGRAPHY Left 05/23/2019   Procedure: LOWER EXTREMITY ANGIOGRAPHY;  Surgeon: Algernon Huxley, MD;  Location: [place] [facility];  Service: Cardiovascular;  Laterality: Left;  . LOWER EXTREMITY ANGIOGRAPHY Right 05/30/2019   Procedure: LOWER EXTREMITY ANGIOGRAPHY;  Surgeon: Algernon Huxley, MD;  Location: [place] [facility];  Service: Cardiovascular;  Laterality: Right;  . PORTA CATH INSERTION N/A 02/28/2019   Procedure: PORTA CATH INSERTION;  Surgeon: Algernon Huxley, MD;  Location: [place] [facility];  Service: Cardiovascular;  Laterality: N/A;  . tumor removed       SOCIAL HISTORY: Social History   Socioeconomic History  . Marital status: Single    Spouse name: Not on file  . Number of children: Not on file  . Years of education: Not on file  . Highest education level: Not on file  Occupational History  . Not on file  Tobacco Use  . Smoking status: Current Every Day Smoker    Packs/day: 1.00    Types: Cigarettes  . Smokeless tobacco: Never Used  Substance and Sexual Activity  . Alcohol use: Not Currently  . Drug use: Not Currently  . Sexual activity: Not on file  Other Topics Concern  . Not on file  Social History Narrative    used to live [place]; moved  To [place]- end of April 2019; in Nursing home; 1pp/day; quit alcohol. 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        Family- dad- prostate ca [at 4y]; brother- 47 died of prostate cancer; brother- 56- no cancers [[place]]; sonGerald Stabs [[place]];[name]-32y prostate ca [facility] [place]]; daughter- 57 [[place]]; another daughter 75 [[place]/addict]. will refer genetics counseling. Given MSI- abnormal; highly suspicious of Lynch syndrome.  Patient's son [name] aware of high possible lynch syndrome.   Social Determinants of Health   Financial Resource Strain:   . Difficulty of Paying Living Expenses: Not on file  Food Insecurity:   . Worried About Charity fundraiser in the Last Year: Not on file  . Ran Out of Food in the Last Year: Not on file  Transportation Needs:   . Lack of Transportation (Medical): Not on file  . Lack of Transportation (Non-Medical): Not on file  Physical Activity:   . Days of Exercise per Week: Not on file  . Minutes of Exercise per Session: Not on file  Stress:   . Feeling of Stress : Not on file  Social Connections:   . Frequency of Communication with Friends and Family: Not on file  .  Frequency of Social Gatherings with Friends and Family: Not on file  . Attends Religious Services: Not on file  . Active Member of Clubs or Organizations: Not on file  . Attends Archivist Meetings: Not on file  . Marital Status: Not on file  Intimate Partner Violence:   . Fear of Current or Ex-Partner: Not on file  . Emotionally Abused: Not on file  . Physically Abused: Not on file  . Sexually Abused: Not on file    FAMILY HISTORY: Family History  Problem Relation Age of Onset  . Prostate cancer Father 45  . Cancer Brother 46       unsure type  . Cancer Paternal Uncle        unsure type  . Cancer Maternal Grandmother        unsure type  . Cancer Paternal Grandmother        unsure type  . Kidney cancer Paternal Grandfather   . Cancer Other        unsure types  . Leukemia Son   . Cancer Son        other cancers, possibly colon    ALLERGIES:  is allergic to  penicillins.  MEDICATIONS:  Current Outpatient Medications  Medication Sig Dispense Refill  . acetaminophen (TYLENOL) 325 MG tablet Take 650 mg by mouth 3 (three) times daily.    Marland Kitchen aspirin EC 81 MG tablet Take 1 tablet (81 mg total) by mouth daily. 150 tablet 2  . atorvastatin (LIPITOR) 10 MG tablet Take 1 tablet (10 mg total) by mouth daily. 30 tablet 11  . clopidogrel (PLAVIX) 75 MG tablet Take 1 tablet (75 mg total) by mouth daily. 30 tablet 11  . cyclobenzaprine (FLEXERIL) 10 MG tablet Take 10 mg by mouth at bedtime.    . diclofenac sodium (VOLTAREN) 1 % GEL Apply 2 g topically See admin instructions. Apply 2 g topically 4 times daily. Apply 2 g to left shoulder, apply 2 g to left hip and 2 g to left knee 4 times daily per [facility] instructions    . escitalopram (LEXAPRO) 5 MG tablet Take 5 mg by mouth daily.    Marland Kitchen gabapentin (NEURONTIN) 100 MG capsule Take 100 mg by mouth 3 (three) times daily.    . mirtazapine (REMERON) 7.5 MG tablet Take 7.5 mg by mouth at bedtime.    Marland Kitchen oxycodone (OXY-IR) 5 MG capsule Take 10 mg by mouth every 6 (six) hours as needed.     No current facility-administered medications for this visit.   Facility-Administered Medications Ordered in Other Visits  Medication Dose Route Frequency Provider Last Rate Last Admin  . 0.9 %  sodium chloride infusion   Intravenous Once Charlaine Dalton R, MD      . heparin lock flush 100 unit/mL  500 Units Intracatheter Once PRN Cammie Sickle, MD      . pembrolizumab [facility]) 200 mg in sodium chloride 0.9 % 50 mL chemo infusion  200 mg Intravenous Once Cammie Sickle, MD          .  PHYSICAL EXAMINATION: ECOG PERFORMANCE STATUS: 1 - Symptomatic but completely ambulatory  Vitals:   08/26/19 0928  BP: 124/88  Pulse: 79  Temp: (!) 97.4 F (36.3 C)   Filed Weights   08/26/19 0928  Weight: 163 lb 8 oz (74.2 kg)    Physical Exam  Constitutional: He is oriented to person, place, and time.  In  wheelchair. Cachectic.  Appears older than his  stated age.  Alone.  HENT:  Head: Normocephalic and atraumatic.  Mouth/Throat: Oropharynx is clear and moist. No oropharyngeal exudate.  Eyes: Pupils are equal, round, and reactive to light.  Cardiovascular: Normal rate and regular rhythm.  Pulmonary/Chest: No respiratory distress. He has no wheezes.  Abdominal: Soft. Bowel sounds are normal. He exhibits no distension and no mass. There is no abdominal tenderness. There is no rebound and no guarding.  Musculoskeletal:        General: No tenderness or edema. Normal range of motion.     Cervical back: Normal range of motion and neck supple.  Neurological: He is oriented to person, place, and time.  Sleepy but easily arousable.  Chronic weakness left upper and lower extremity.  Skin: Skin is warm.  Bilateral lower extremity ulcerations noted.  Pulses intact bilaterally.  Psychiatric: Affect normal.     LABORATORY DATA:  I have reviewed the data as listed Lab Results  Component Value Date   WBC 7.7 08/26/2019   HGB 11.4 (L) 08/26/2019   HCT 37.7 (L) 08/26/2019   MCV 80.6 08/26/2019   PLT 203 08/26/2019   Recent Labs    06/17/19 0923 06/17/19 0923 07/08/19 0910 08/24/19 1008 08/26/19 0916  NA 134*  --  136  --  136  K 4.1  --  4.2  --  4.4  CL 105  --  103  --  104  CO2 24  --  23  --  24  GLUCOSE 103*  --  128*  --  117*  BUN 32*  --  23*  --  22*  CREATININE 1.04   < > 0.93 1.00 1.09  CALCIUM 8.7*  --  8.8*  --  8.7*  GFRNONAA >60  --  >60  --  >60  GFRAA >60  --  >60  --  >60  PROT 8.3*  --  8.1  --  8.0  ALBUMIN 4.1  --  3.7  --  3.7  AST 34  --  103*  --  44*  ALT 31  --  139*  --  41  ALKPHOS 106  --  139*  --  117  BILITOT 0.6  --  0.5  --  0.5   < > = values in this interval not displayed.    RADIOGRAPHIC STUDIES: I have personally reviewed the radiological images as listed and agreed with the findings in the report. CT ABDOMEN PELVIS W CONTRAST  Result  Date: 08/24/2019 CLINICAL DATA:  History of right ureteral cancer. EXAM: CT ABDOMEN AND PELVIS WITH CONTRAST TECHNIQUE: Multidetector CT imaging of the abdomen and pelvis was performed using the standard protocol following bolus administration of intravenous contrast. CONTRAST:  181m OMNIPAQUE IOHEXOL 300 MG/ML  SOLN COMPARISON:  04/14/2019. FINDINGS: Lower chest: Unremarkable Hepatobiliary: No suspicious focal abnormality within the liver parenchyma. There is no evidence for gallstones, gallbladder wall thickening, or pericholecystic fluid. No intrahepatic or extrahepatic biliary dilation. Pancreas: No focal mass lesion. No dilatation of the main duct. No intraparenchymal cyst. No peripancreatic edema. Spleen: No splenomegaly. No focal mass lesion. Adrenals/Urinary Tract: No adrenal nodule or mass. Nonobstructing stone lower pole right kidney is stable. Right double-J internal ureteral stent is stable in position. Mild right hydronephrosis with cortical thinning and decreased perfusion noted right kidney. There is wall thickening and ill definition of the right renal pelvis and right UPJ, similar to prior. Stable 15 mm complex cyst interpolar left kidney. Left ureter unremarkable. The urinary bladder appears  normal for the degree of distention. Stomach/Bowel: Stomach is unremarkable. No gastric wall thickening. No evidence of outlet obstruction. Duodenum is normally positioned as is the ligament of Treitz. No small bowel wall thickening. No small bowel dilatation. Status post right hemicolectomy No gross colonic mass. No colonic wall thickening. Vascular/Lymphatic: There is abdominal aortic atherosclerosis without aneurysm. Upper normal hepatoduodenal ligament lymph nodes are stable. No retroperitoneal lymphadenopathy. Stable appearance of upper normal pelvic sidewall lymphadenopathy bilaterally including 9 mm short axis left iliac node on 66/2 and 10 mm short axis left pelvic sidewall node on 69/2. Index right  groin node measured previously at 9 mm short axis is stable (75/2). Index left inguinal node measured previously at 1.5 cm short axis is stable today (93/2). Reproductive: Prostate gland unremarkable. Other: No intraperitoneal free fluid. Musculoskeletal: No worrisome lytic or sclerotic osseous abnormality. IMPRESSION: 1. No substantial interval change in exam. The irregular wall thickening noted previously in the right renal pelvis into the region of the right UPJ is similar. No substantial change in the mild right hydronephrosis with decreased perfusion/excretion from the right kidney. Right ureteral stent is stable in position. 2. Stable borderline to mild bilateral pelvic lymphadenopathy. 3.  Aortic Atherosclerois (ICD10-170.0) Electronically Signed   By: Misty Stanley M.D.   On: 08/24/2019 11:43    ASSESSMENT & PLAN:   Ureteral cancer, right ([place]) # High-grade urothelial cancer/cytology; likely of the right renal pelvis /upper ureter. JAN 2021- 17th 2020-C/A/P- STABLE;  slight ureteral thickening/small pelvic/retroperitoneal lymph nodes. STABLE.  # Proceed with [place] today; Labs today reviewed;  acceptable for treatment today.   # LFTs-slightly elevated- ? Keytruda vs.others-Hx of hepatitis C. Improved.   #Colon cancer.  Stage IIII- STABLE.   #Iron deficiency anemia-hemoglobin 12.7-STABLE.   # Bilateral LE ulcers-s/p wound care evaluation; s/p PTCA with Dr.Dew. bil Arterial Dopp-wnl. STABLE.   # I discussed regarding Covid-19 precautions.  I reviewed the vaccine effectiveness and potential side effects in detail.  Also discussed long-term effectiveness and safety profile are unclear at this time.  I discussed December, 2020 ASCO position statement-that all patients are recommended COVID-19 vaccinations [when available]-as long as they do not have allergy to components of the vaccine.  However, I think the benefits of the vaccination outweigh the potential risks. Re: [id number] vaccination. Pt  s/p #1 shot.    #DISPOSITION:  [name] today # follow up in 3 weeks -MD labs-cbc/cmp/Keytruda; Dr.B  All questions were answered. The patient knows to call the clinic with any problems, questions or concerns.    Cammie Sickle, MD 08/26/2019 10:25 AM

## 2019-08-26 NOTE — Assessment & Plan Note (Addendum)
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 JAN 2021- 17th 2020-C/A/P- STABLE;  slight ureteral thickening/small pelvic/retroperitoneal lymph nodes.  No lesions in the liver.  Overall stable.  # Proceed with [place] today; Labs today reviewed;  acceptable for treatment today.   # LFTs-slightly elevated- ? Keytruda vs.others-Hx of hepatitis C. currently improved.  Monitor closely on Keytruda.  #Colon cancer.  Stage IIII- STABLE.   # Iron deficiency anemia-hemoglobin 12.7-STABLE.   # Bilateral LE ulcers-s/p wound care evaluation; s/p PTCA with Dr.Dew. bil Arterial Dopp-wnl. STABLE.   # I discussed regarding Covid-19 precautions.  I reviewed the vaccine effectiveness and potential side effects in detail.  Also discussed long-term effectiveness and safety profile are unclear at this time.  I discussed December, 2020 [facility] position statement-that all patients are recommended COVID-19 vaccinations [when available]-as long as they do not have allergy to components of the vaccine.  However, I think the benefits of the vaccination outweigh the potential risks. Re: [id number] vaccination. Pt s/p #1 shot.    #DISPOSITION:  [name] today # follow up in 3 weeks -MD labs-cbc/cmp/Keytruda; Dr.B  # I reviewed the blood work- with the patient in detail; also reviewed the imaging independently [as summarized above]; and with the patient in detail.

## 2019-09-16 ENCOUNTER — Inpatient Hospital Stay: Payer: Medicaid Other

## 2019-09-16 ENCOUNTER — Other Ambulatory Visit: Payer: Self-pay | Admitting: Licensed Clinical Social Worker

## 2019-09-16 ENCOUNTER — Inpatient Hospital Stay (HOSPITAL_BASED_OUTPATIENT_CLINIC_OR_DEPARTMENT_OTHER): Payer: Medicaid Other | Admitting: Internal Medicine

## 2019-09-16 ENCOUNTER — Other Ambulatory Visit: Payer: Self-pay

## 2019-09-16 ENCOUNTER — Inpatient Hospital Stay: Payer: Medicaid Other | Attending: Internal Medicine

## 2019-09-16 DIAGNOSIS — R5383 Other fatigue: Secondary | ICD-10-CM | POA: Diagnosis not present

## 2019-09-16 DIAGNOSIS — Z95828 Presence of other vascular implants and grafts: Secondary | ICD-10-CM

## 2019-09-16 DIAGNOSIS — Z7982 Long term (current) use of aspirin: Secondary | ICD-10-CM | POA: Insufficient documentation

## 2019-09-16 DIAGNOSIS — C661 Malignant neoplasm of right ureter: Secondary | ICD-10-CM

## 2019-09-16 DIAGNOSIS — L97929 Non-pressure chronic ulcer of unspecified part of left lower leg with unspecified severity: Secondary | ICD-10-CM | POA: Diagnosis not present

## 2019-09-16 DIAGNOSIS — Z800 Family history of malignant neoplasm of digestive organs: Secondary | ICD-10-CM | POA: Diagnosis not present

## 2019-09-16 DIAGNOSIS — I739 Peripheral vascular disease, unspecified: Secondary | ICD-10-CM | POA: Diagnosis not present

## 2019-09-16 DIAGNOSIS — Z85038 Personal history of other malignant neoplasm of large intestine: Secondary | ICD-10-CM | POA: Diagnosis not present

## 2019-09-16 DIAGNOSIS — E785 Hyperlipidemia, unspecified: Secondary | ICD-10-CM | POA: Insufficient documentation

## 2019-09-16 DIAGNOSIS — L97919 Non-pressure chronic ulcer of unspecified part of right lower leg with unspecified severity: Secondary | ICD-10-CM | POA: Diagnosis not present

## 2019-09-16 DIAGNOSIS — Z79899 Other long term (current) drug therapy: Secondary | ICD-10-CM | POA: Diagnosis not present

## 2019-09-16 DIAGNOSIS — R7989 Other specified abnormal findings of blood chemistry: Secondary | ICD-10-CM | POA: Insufficient documentation

## 2019-09-16 DIAGNOSIS — F418 Other specified anxiety disorders: Secondary | ICD-10-CM | POA: Diagnosis not present

## 2019-09-16 DIAGNOSIS — R5381 Other malaise: Secondary | ICD-10-CM | POA: Insufficient documentation

## 2019-09-16 DIAGNOSIS — F17210 Nicotine dependence, cigarettes, uncomplicated: Secondary | ICD-10-CM | POA: Insufficient documentation

## 2019-09-16 DIAGNOSIS — Z8673 Personal history of transient ischemic attack (TIA), and cerebral infarction without residual deficits: Secondary | ICD-10-CM | POA: Diagnosis not present

## 2019-09-16 DIAGNOSIS — Z8042 Family history of malignant neoplasm of prostate: Secondary | ICD-10-CM | POA: Insufficient documentation

## 2019-09-16 DIAGNOSIS — Z515 Encounter for palliative care: Secondary | ICD-10-CM

## 2019-09-16 DIAGNOSIS — Z5112 Encounter for antineoplastic immunotherapy: Secondary | ICD-10-CM | POA: Diagnosis not present

## 2019-09-16 DIAGNOSIS — Z791 Long term (current) use of non-steroidal anti-inflammatories (NSAID): Secondary | ICD-10-CM | POA: Diagnosis not present

## 2019-09-16 DIAGNOSIS — Z9221 Personal history of antineoplastic chemotherapy: Secondary | ICD-10-CM | POA: Diagnosis not present

## 2019-09-16 DIAGNOSIS — J449 Chronic obstructive pulmonary disease, unspecified: Secondary | ICD-10-CM | POA: Diagnosis not present

## 2019-09-16 DIAGNOSIS — K219 Gastro-esophageal reflux disease without esophagitis: Secondary | ICD-10-CM | POA: Diagnosis not present

## 2019-09-16 DIAGNOSIS — B1920 Unspecified viral hepatitis C without hepatic coma: Secondary | ICD-10-CM | POA: Diagnosis not present

## 2019-09-16 DIAGNOSIS — Z7189 Other specified counseling: Secondary | ICD-10-CM

## 2019-09-16 LAB — CBC WITH DIFFERENTIAL/PLATELET
Abs Immature Granulocytes: 0.02 10*3/uL (ref 0.00–0.07)
Basophils Absolute: 0.1 10*3/uL (ref 0.0–0.1)
Basophils Relative: 1 %
Eosinophils Absolute: 0.5 10*3/uL (ref 0.0–0.5)
Eosinophils Relative: 7 %
HCT: 36.3 % — ABNORMAL LOW (ref 39.0–52.0)
Hemoglobin: 11 g/dL — ABNORMAL LOW (ref 13.0–17.0)
Immature Granulocytes: 0 %
Lymphocytes Relative: 19 %
Lymphs Abs: 1.5 10*3/uL (ref 0.7–4.0)
MCH: 23.6 pg — ABNORMAL LOW (ref 26.0–34.0)
MCHC: 30.3 g/dL (ref 30.0–36.0)
MCV: 77.7 fL — ABNORMAL LOW (ref 80.0–100.0)
Monocytes Absolute: 0.5 10*3/uL (ref 0.1–1.0)
Monocytes Relative: 7 %
Neutro Abs: 5.1 10*3/uL (ref 1.7–7.7)
Neutrophils Relative %: 66 %
Platelets: 251 10*3/uL (ref 150–400)
RBC: 4.67 MIL/uL (ref 4.22–5.81)
RDW: 14 % (ref 11.5–15.5)
WBC: 7.7 10*3/uL (ref 4.0–10.5)
nRBC: 0 % (ref 0.0–0.2)

## 2019-09-16 LAB — IRON AND TIBC
Iron: 19 ug/dL — ABNORMAL LOW (ref 45–182)
Saturation Ratios: 4 % — ABNORMAL LOW (ref 17.9–39.5)
TIBC: 431 ug/dL (ref 250–450)
UIBC: 412 ug/dL

## 2019-09-16 LAB — COMPREHENSIVE METABOLIC PANEL
ALT: 27 U/L (ref 0–44)
AST: 35 U/L (ref 15–41)
Albumin: 3.6 g/dL (ref 3.5–5.0)
Alkaline Phosphatase: 120 U/L (ref 38–126)
Anion gap: 9 (ref 5–15)
BUN: 24 mg/dL — ABNORMAL HIGH (ref 6–20)
CO2: 25 mmol/L (ref 22–32)
Calcium: 8.6 mg/dL — ABNORMAL LOW (ref 8.9–10.3)
Chloride: 104 mmol/L (ref 98–111)
Creatinine, Ser: 1.02 mg/dL (ref 0.61–1.24)
GFR calc Af Amer: >60 mL/min (ref >60)
GFR calc non Af Amer: >60 mL/min (ref >60)
Glucose, Bld: 113 mg/dL — ABNORMAL HIGH (ref 70–99)
Potassium: 4.3 mmol/L (ref 3.5–5.1)
Sodium: 138 mmol/L (ref 135–145)
Total Bilirubin: 0.3 mg/dL (ref 0.3–1.2)
Total Protein: 8.1 g/dL (ref 6.5–8.1)

## 2019-09-16 LAB — FERRITIN: Ferritin: 20 ng/mL — ABNORMAL LOW (ref 24–336)

## 2019-09-16 MED ORDER — SODIUM CHLORIDE 0.9 % IV SOLN
Freq: Once | INTRAVENOUS | Status: AC
  Filled 2019-09-16: qty 250

## 2019-09-16 MED ORDER — HEPARIN SOD (PORK) LOCK FLUSH 100 UNIT/ML IV SOLN
INTRAVENOUS | Status: AC
  Filled 2019-09-16: qty 5

## 2019-09-16 MED ORDER — SODIUM CHLORIDE 0.9 % IV SOLN
200.0000 mg | Freq: Once | INTRAVENOUS | Status: AC
  Administered 2019-09-16: 200 mg via INTRAVENOUS
  Filled 2019-09-16: qty 8

## 2019-09-16 MED ORDER — HEPARIN SOD (PORK) LOCK FLUSH 100 UNIT/ML IV SOLN
500.0000 [IU] | Freq: Once | INTRAVENOUS | Status: AC | PRN
  Administered 2019-09-16: 500 [IU]
  Filled 2019-09-16: qty 5

## 2019-09-16 MED ORDER — SODIUM CHLORIDE 0.9% FLUSH
10.0000 mL | Freq: Once | INTRAVENOUS | Status: AC
  Administered 2019-09-16: 10:00:00 10 mL via INTRAVENOUS
  Filled 2019-09-16: qty 10

## 2019-09-16 NOTE — Assessment & Plan Note (Addendum)
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 JAN 2021- 17th 2020-C/A/P- STABLE;  slight ureteral thickening/small pelvic/retroperitoneal lymph nodes.  No lesions in the liver. Stable.   # Proceed with [place] today; Labs today reviewed;  acceptable for treatment today.   # LFTs-slightly elevated- ? Keytruda vs.others-Hx of hepatitis C. currently improved.  Monitor closely on Keytruda.   # Iron deficiency anemia-hemoglobin 11.5- CHECK iron studies/ferrtin  # Bilateral LE ulcers-s/p wound care evaluation; s/p PTCA with Dr.Dew. bil Arterial Dopp-wnl. STABLE.     #DISPOSITION: ADD IRON STUDIES/FERRITIN # [name] today # follow up in 3 weeks -MD labs-cbc/cmp/Keytruda/possible venofer; Dr.B

## 2019-09-16 NOTE — Progress Notes (Signed)
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; # Colon ca- stage III  GOALS: palliative  CURRENT/MOST RECENT THERAPY: Keytruda [C]    Urothelial cancer ([place])   Initial Diagnosis   Urothelial cancer ([place])   Ureteral cancer, right ([place])  05/26/2018 Initial Diagnosis   Ureteral cancer, right ([place])   06/21/2018 -  Chemotherapy   The patient had pembrolizumab (KEYTRUDA) 200 mg in sodium chloride 0.9 % 50 mL chemo infusion, 200 mg, Intravenous, Once, 15 of 18 cycles Administration: 200 mg (06/21/2018), 200 mg (07/12/2018), 200 mg (08/02/2018), 200 mg (08/23/2018), 200 mg (09/13/2018), 200 mg (10/04/2018), 200 mg (10/25/2018), 200 mg (11/22/2018), 200 mg (12/13/2018), 200 mg (01/05/2019), 200 mg (01/24/2019), 200 mg (02/14/2019), 200 mg (03/11/2019), 200 mg (06/17/2019), 200 mg (08/26/2019)  for chemotherapy treatment.       HISTORY OF PRESENTING ILLNESS: Patient is a poor historian.  Is alone.  Chad [name] 59 y.o.  male above history of stage IV-ureteral cancer/with multiple co-morbidities currently on Keytruda is here for follow-up.  Patient denies any blood in stools or black-colored stools.  Denies any blood in urine.  No nausea no vomiting.  No abdominal pain.  No fevers or chills.  Chronic shortness of breath chronic joint pains   Review of Systems  Constitutional: Positive for malaise/fatigue. Negative for chills, diaphoresis, fever and  weight loss.  HENT: Negative for nosebleeds and sore throat.   Eyes: Negative for double vision.  Respiratory: Positive for cough and shortness of breath. Negative for hemoptysis and wheezing.   Cardiovascular: Negative for chest pain, palpitations and orthopnea.  Gastrointestinal: Negative for abdominal pain, blood in stool, constipation, diarrhea, heartburn, melena, nausea and vomiting.  Genitourinary: Negative for dysuria, frequency and urgency.  Musculoskeletal: Positive for back pain and joint pain.  Skin: Negative.  Negative for itching and rash.  Neurological: Positive for focal weakness. Negative for dizziness, tingling, weakness and headaches.       Chronic left-sided weakness upper than lower extremity.  Endo/Heme/Allergies: Does not bruise/bleed easily.  Psychiatric/Behavioral: Negative for depression. The patient is not nervous/anxious and does not have insomnia.      MEDICAL HISTORY:  Past Medical History:  Diagnosis Date  . Anemia   . Anxiety   . ARF (acute respiratory failure) ([place])   . Bladder cancer ([place])   . Cancer [facility])    prostate  . COPD (chronic obstructive pulmonary disease) ([place])   . Depression   . Dysphagia   . Family history of colon cancer   . Family history of kidney cancer   . Family history of leukemia   . Family history of prostate cancer   . GERD (gastroesophageal reflux disease)   . Hepatitis    chronic hep c  . Hydronephrosis   . Hyperlipidemia   . Knee pain    Left  . Malignant neoplasm of colon ([place])   . Nerve pain   . Peripheral vascular disease ([place])   .  Stroke ([place])   . Urinary frequency     SURGICAL HISTORY: Past Surgical History:  Procedure Laterality Date  . COLON SURGERY     En bloc extended right hemicolectomy 07/2017  . CYSTOSCOPY W/ RETROGRADES Right 08/30/2018   Procedure: CYSTOSCOPY WITH RETROGRADE PYELOGRAM;  Surgeon: Hollice Espy, MD;  Location: [facility] ORS;  Service: Urology;  Laterality: Right;  . CYSTOSCOPY WITH  STENT PLACEMENT Right 04/25/2018   Procedure: CYSTOSCOPY WITH STENT PLACEMENT;  Surgeon: Hollice Espy, MD;  Location: [facility] OR[facility];  Service: Urology;  Laterality: Right;  . CYSTOSCOPY WITH STENT PLACEMENT Right 08/30/2018   Procedure: [place] WITH STENT Exchange;  Surgeon: Hollice Espy, MD;  Location: [facility] OR[facility];  Service: Urology;  Laterality: Right;  . CYSTOSCOPY WITH STENT PLACEMENT Right 03/07/2019   Procedure: CYSTOSCOPY WITH STENT Exchange;  Surgeon: Hollice Espy, MD;  Location: [facility] OR[facility];  Service: Urology;  Laterality: Right;  . LOWER EXTREMITY ANGIOGRAPHY Left 05/23/2019   Procedure: LOWER EXTREMITY ANGIOGRAPHY;  Surgeon: Algernon Huxley, MD;  Location: [place] [facility];  Service: Cardiovascular;  Laterality: Left;  . LOWER EXTREMITY ANGIOGRAPHY Right 05/30/2019   Procedure: LOWER EXTREMITY ANGIOGRAPHY;  Surgeon: Algernon Huxley, MD;  Location: [place] [facility];  Service: Cardiovascular;  Laterality: Right;  . PORTA CATH INSERTION N/A 02/28/2019   Procedure: PORTA CATH INSERTION;  Surgeon: Algernon Huxley, MD;  Location: [place] [facility];  Service: Cardiovascular;  Laterality: N/A;  . tumor removed       SOCIAL HISTORY: Social History   Socioeconomic History  . Marital status: Single    Spouse name: Not on file  . Number of children: Not on file  . Years of education: Not on file  . Highest education level: Not on file  Occupational History  . Not on file  Tobacco Use  . Smoking status: Current Every Day Smoker    Packs/day: 1.00    Types: Cigarettes  . Smokeless tobacco: Never Used  Substance and Sexual Activity  . Alcohol use: Not Currently  . Drug use: Not Currently  . Sexual activity: Not on file  Other Topics Concern  . Not on file  Social History Narrative    used to live [place]; moved  To [place]- end of April 2019; in Nursing home; 1pp/day; quit alcohol. 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        Family- dad- prostate ca [at 89y]; brother- 12 died of prostate cancer;  brother- 55- no cancers [[place]]; sonGerald Stabs [[place]];[name]-32y prostate ca [facility] [place]]; daughter- 36 [[place]]; another daughter 50 [[place]/addict]. will refer genetics counseling. Given MSI- abnormal; highly suspicious of Lynch syndrome.  Patient's son [name] aware of high possible lynch syndrome.   Social Determinants of Health   Financial Resource Strain:   . Difficulty of Paying Living Expenses: Not on file  Food Insecurity:   . Worried About Charity fundraiser in the Last Year: Not on file  . Ran Out of Food in the Last Year: Not on file  Transportation Needs:   . Lack of Transportation (Medical): Not on file  . Lack of Transportation (Non-Medical): Not on file  Physical Activity:   . Days of Exercise per Week: Not on file  . Minutes of Exercise per Session: Not on file  Stress:   . Feeling of Stress : Not on file  Social Connections:   . Frequency of Communication with Friends and Family: Not on file  . Frequency of Social Gatherings with Friends  and Family: Not on file  . Attends Religious Services: Not on file  . Active Member of Clubs or Organizations: Not on file  . Attends Archivist Meetings: Not on file  . Marital Status: Not on file  Intimate Partner Violence:   . Fear of Current or Ex-Partner: Not on file  . Emotionally Abused: Not on file  . Physically Abused: Not on file  . Sexually Abused: Not on file    FAMILY HISTORY: Family History  Problem Relation Age of Onset  . Prostate cancer Father 85  . Cancer Brother 40       unsure type  . Cancer Paternal Uncle        unsure type  . Cancer Maternal Grandmother        unsure type  . Cancer Paternal Grandmother        unsure type  . Kidney cancer Paternal Grandfather   . Cancer Other        unsure types  . Leukemia Son   . Cancer Son        other cancers, possibly colon    ALLERGIES:  is allergic to penicillins.  MEDICATIONS:  Current Outpatient Medications  Medication Sig Dispense Refill   . acetaminophen (TYLENOL) 325 MG tablet Take 650 mg by mouth 3 (three) times daily.    Marland Kitchen aspirin EC 81 MG tablet Take 1 tablet (81 mg total) by mouth daily. 150 tablet 2  . atorvastatin (LIPITOR) 10 MG tablet Take 1 tablet (10 mg total) by mouth daily. 30 tablet 11  . clopidogrel (PLAVIX) 75 MG tablet Take 1 tablet (75 mg total) by mouth daily. 30 tablet 11  . cyclobenzaprine (FLEXERIL) 10 MG tablet Take 10 mg by mouth at bedtime.    . diclofenac sodium (VOLTAREN) 1 % GEL Apply 2 g topically See admin instructions. Apply 2 g topically 4 times daily. Apply 2 g to left shoulder, apply 2 g to left hip and 2 g to left knee 4 times daily per [facility] instructions    . escitalopram (LEXAPRO) 5 MG tablet Take 5 mg by mouth daily.    Marland Kitchen gabapentin (NEURONTIN) 100 MG capsule Take 100 mg by mouth 3 (three) times daily.    . mirtazapine (REMERON) 7.5 MG tablet Take 7.5 mg by mouth at bedtime.    Marland Kitchen oxycodone (OXY-IR) 5 MG capsule Take 10 mg by mouth every 6 (six) hours as needed.     No current facility-administered medications for this visit.      Marland Kitchen  PHYSICAL EXAMINATION: ECOG PERFORMANCE STATUS: 1 - Symptomatic but completely ambulatory  Vitals:   09/16/19 1029  BP: 116/70  Pulse: 79  Resp: 20  Temp: (!) 96.6 F (35.9 C)   Filed Weights   09/16/19 1029  Weight: 170 lb (77.1 kg)    Physical Exam  Constitutional: He is oriented to person, place, and time.  In wheelchair. Cachectic.  Appears older than his stated age.  Alone.  HENT:  Head: Normocephalic and atraumatic.  Mouth/Throat: Oropharynx is clear and moist. No oropharyngeal exudate.  Eyes: Pupils are equal, round, and reactive to light.  Cardiovascular: Normal rate and regular rhythm.  Pulmonary/Chest: No respiratory distress. He has no wheezes.  Abdominal: Soft. Bowel sounds are normal. He exhibits no distension and no mass. There is no abdominal tenderness. There is no rebound and no guarding.  Musculoskeletal:        General: No  tenderness or edema. Normal range of motion.  Cervical back: Normal range of motion and neck supple.  Neurological: He is oriented to person, place, and time.  Sleepy but easily arousable.  Chronic weakness left upper and lower extremity.  Skin: Skin is warm.  Bilateral lower extremity ulcerations noted.  Pulses intact bilaterally.  Psychiatric: Affect normal.     LABORATORY DATA:  I have reviewed the data as listed Lab Results  Component Value Date   WBC 7.7 09/16/2019   HGB 11.0 (L) 09/16/2019   HCT 36.3 (L) 09/16/2019   MCV 77.7 (L) 09/16/2019   PLT 251 09/16/2019   Recent Labs    07/08/19 0910 07/08/19 0910 08/24/19 1008 08/26/19 0916 09/16/19 1011  NA 136  --   --  136 138  K 4.2  --   --  4.4 4.3  CL 103  --   --  104 104  CO2 23  --   --  24 25  GLUCOSE 128*  --   --  117* 113*  BUN 23*  --   --  22* 24*  CREATININE 0.93   < > 1.00 1.09 1.02  CALCIUM 8.8*  --   --  8.7* 8.6*  GFRNONAA >60  --   --  >60 >60  GFRAA >60  --   --  >60 >60  PROT 8.1  --   --  8.0 8.1  ALBUMIN 3.7  --   --  3.7 3.6  AST 103*  --   --  44* 35  ALT 139*  --   --  41 27  ALKPHOS 139*  --   --  117 120  BILITOT 0.5  --   --  0.5 0.3   < > = values in this interval not displayed.    RADIOGRAPHIC STUDIES: I have personally reviewed the radiological images as listed and agreed with the findings in the report. CT ABDOMEN PELVIS W CONTRAST  Result Date: 08/24/2019 CLINICAL DATA:  History of right ureteral cancer. EXAM: CT ABDOMEN AND PELVIS WITH CONTRAST TECHNIQUE: Multidetector CT imaging of the abdomen and pelvis was performed using the standard protocol following bolus administration of intravenous contrast. CONTRAST:  117m OMNIPAQUE IOHEXOL 300 MG/ML  SOLN COMPARISON:  04/14/2019. FINDINGS: Lower chest: Unremarkable Hepatobiliary: No suspicious focal abnormality within the liver parenchyma. There is no evidence for gallstones, gallbladder wall thickening, or pericholecystic fluid. No  intrahepatic or extrahepatic biliary dilation. Pancreas: No focal mass lesion. No dilatation of the main duct. No intraparenchymal cyst. No peripancreatic edema. Spleen: No splenomegaly. No focal mass lesion. Adrenals/Urinary Tract: No adrenal nodule or mass. Nonobstructing stone lower pole right kidney is stable. Right double-J internal ureteral stent is stable in position. Mild right hydronephrosis with cortical thinning and decreased perfusion noted right kidney. There is wall thickening and ill definition of the right renal pelvis and right UPJ, similar to prior. Stable 15 mm complex cyst interpolar left kidney. Left ureter unremarkable. The urinary bladder appears normal for the degree of distention. Stomach/Bowel: Stomach is unremarkable. No gastric wall thickening. No evidence of outlet obstruction. Duodenum is normally positioned as is the ligament of Treitz. No small bowel wall thickening. No small bowel dilatation. Status post right hemicolectomy No gross colonic mass. No colonic wall thickening. Vascular/Lymphatic: There is abdominal aortic atherosclerosis without aneurysm. Upper normal hepatoduodenal ligament lymph nodes are stable. No retroperitoneal lymphadenopathy. Stable appearance of upper normal pelvic sidewall lymphadenopathy bilaterally including 9 mm short axis left iliac node on 66/2 and 10 mm short axis left  pelvic sidewall node on 69/2. Index right groin node measured previously at 9 mm short axis is stable (75/2). Index left inguinal node measured previously at 1.5 cm short axis is stable today (93/2). Reproductive: Prostate gland unremarkable. Other: No intraperitoneal free fluid. Musculoskeletal: No worrisome lytic or sclerotic osseous abnormality. IMPRESSION: 1. No substantial interval change in exam. The irregular wall thickening noted previously in the right renal pelvis into the region of the right UPJ is similar. No substantial change in the mild right hydronephrosis with decreased  perfusion/excretion from the right kidney. Right ureteral stent is stable in position. 2. Stable borderline to mild bilateral pelvic lymphadenopathy. 3.  Aortic Atherosclerois (ICD10-170.0) Electronically Signed   By: Misty Stanley M.D.   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 JAN 2021- 17th 2020-C/A/P- STABLE;  slight ureteral thickening/small pelvic/retroperitoneal lymph nodes.  No lesions in the liver. Stable.   # Proceed with [place] today; Labs today reviewed;  acceptable for treatment today.   # LFTs-slightly elevated- ? Keytruda vs.others-Hx of hepatitis C. currently improved.  Monitor closely on Keytruda.   # Iron deficiency anemia-hemoglobin 11.5- CHECK iron studies/ferrtin  # Bilateral LE ulcers-s/p wound care evaluation; s/p PTCA with Dr.Dew. bil Arterial Dopp-wnl. STABLE.     #DISPOSITION: ADD IRON STUDIES/FERRITIN # [name] today # follow up in 3 weeks -MD labs-cbc/cmp/Keytruda/possible venofer; Dr.B    All questions were answered. The patient knows to call the clinic with any problems, questions or concerns.    Cammie Sickle, MD 09/16/2019 10:55 AM

## 2019-10-07 ENCOUNTER — Other Ambulatory Visit: Payer: Self-pay

## 2019-10-07 ENCOUNTER — Inpatient Hospital Stay: Payer: Medicaid Other

## 2019-10-07 ENCOUNTER — Inpatient Hospital Stay (HOSPITAL_BASED_OUTPATIENT_CLINIC_OR_DEPARTMENT_OTHER): Payer: Medicaid Other | Admitting: Internal Medicine

## 2019-10-07 ENCOUNTER — Other Ambulatory Visit: Payer: Self-pay | Admitting: *Deleted

## 2019-10-07 ENCOUNTER — Inpatient Hospital Stay: Payer: Medicaid Other | Attending: Internal Medicine

## 2019-10-07 DIAGNOSIS — F17210 Nicotine dependence, cigarettes, uncomplicated: Secondary | ICD-10-CM | POA: Diagnosis not present

## 2019-10-07 DIAGNOSIS — Z95828 Presence of other vascular implants and grafts: Secondary | ICD-10-CM

## 2019-10-07 DIAGNOSIS — C661 Malignant neoplasm of right ureter: Secondary | ICD-10-CM | POA: Insufficient documentation

## 2019-10-07 DIAGNOSIS — K219 Gastro-esophageal reflux disease without esophagitis: Secondary | ICD-10-CM | POA: Diagnosis not present

## 2019-10-07 DIAGNOSIS — Z7982 Long term (current) use of aspirin: Secondary | ICD-10-CM | POA: Insufficient documentation

## 2019-10-07 DIAGNOSIS — E785 Hyperlipidemia, unspecified: Secondary | ICD-10-CM | POA: Insufficient documentation

## 2019-10-07 DIAGNOSIS — Z8673 Personal history of transient ischemic attack (TIA), and cerebral infarction without residual deficits: Secondary | ICD-10-CM | POA: Diagnosis not present

## 2019-10-07 DIAGNOSIS — I739 Peripheral vascular disease, unspecified: Secondary | ICD-10-CM | POA: Insufficient documentation

## 2019-10-07 DIAGNOSIS — B182 Chronic viral hepatitis C: Secondary | ICD-10-CM | POA: Diagnosis not present

## 2019-10-07 DIAGNOSIS — Z5112 Encounter for antineoplastic immunotherapy: Secondary | ICD-10-CM | POA: Insufficient documentation

## 2019-10-07 DIAGNOSIS — R5381 Other malaise: Secondary | ICD-10-CM | POA: Diagnosis not present

## 2019-10-07 DIAGNOSIS — Z515 Encounter for palliative care: Secondary | ICD-10-CM

## 2019-10-07 DIAGNOSIS — F418 Other specified anxiety disorders: Secondary | ICD-10-CM | POA: Insufficient documentation

## 2019-10-07 DIAGNOSIS — N401 Enlarged prostate with lower urinary tract symptoms: Secondary | ICD-10-CM | POA: Diagnosis not present

## 2019-10-07 DIAGNOSIS — Z9221 Personal history of antineoplastic chemotherapy: Secondary | ICD-10-CM | POA: Diagnosis not present

## 2019-10-07 DIAGNOSIS — R5383 Other fatigue: Secondary | ICD-10-CM | POA: Insufficient documentation

## 2019-10-07 DIAGNOSIS — Z85038 Personal history of other malignant neoplasm of large intestine: Secondary | ICD-10-CM | POA: Insufficient documentation

## 2019-10-07 DIAGNOSIS — Z79899 Other long term (current) drug therapy: Secondary | ICD-10-CM | POA: Diagnosis not present

## 2019-10-07 DIAGNOSIS — J449 Chronic obstructive pulmonary disease, unspecified: Secondary | ICD-10-CM | POA: Insufficient documentation

## 2019-10-07 DIAGNOSIS — D500 Iron deficiency anemia secondary to blood loss (chronic): Secondary | ICD-10-CM

## 2019-10-07 DIAGNOSIS — Z7189 Other specified counseling: Secondary | ICD-10-CM

## 2019-10-07 LAB — URINALYSIS, COMPLETE (UACMP) WITH MICROSCOPIC
Bilirubin Urine: NEGATIVE
Glucose, UA: NEGATIVE mg/dL
Ketones, ur: NEGATIVE mg/dL
Nitrite: POSITIVE — AB
Protein, ur: 100 mg/dL — AB
RBC / HPF: >50 RBC/hpf — ABNORMAL HIGH (ref 0–5)
Specific Gravity, Urine: 1.02 (ref 1.005–1.030)
WBC, UA: >50 WBC/hpf — ABNORMAL HIGH (ref 0–5)
pH: 6 (ref 5.0–8.0)

## 2019-10-07 LAB — COMPREHENSIVE METABOLIC PANEL
ALT: 25 U/L (ref 0–44)
AST: 32 U/L (ref 15–41)
Albumin: 3.9 g/dL (ref 3.5–5.0)
Alkaline Phosphatase: 111 U/L (ref 38–126)
Anion gap: 8 (ref 5–15)
BUN: 26 mg/dL — ABNORMAL HIGH (ref 8–23)
CO2: 23 mmol/L (ref 22–32)
Calcium: 8.7 mg/dL — ABNORMAL LOW (ref 8.9–10.3)
Chloride: 105 mmol/L (ref 98–111)
Creatinine, Ser: 0.92 mg/dL (ref 0.61–1.24)
GFR calc Af Amer: >60 mL/min (ref >60)
GFR calc non Af Amer: >60 mL/min (ref >60)
Glucose, Bld: 97 mg/dL (ref 70–99)
Potassium: 4.3 mmol/L (ref 3.5–5.1)
Sodium: 136 mmol/L (ref 135–145)
Total Bilirubin: 0.7 mg/dL (ref 0.3–1.2)
Total Protein: 8.3 g/dL — ABNORMAL HIGH (ref 6.5–8.1)

## 2019-10-07 LAB — CBC WITH DIFFERENTIAL/PLATELET
Abs Immature Granulocytes: 0.03 10*3/uL (ref 0.00–0.07)
Basophils Absolute: 0.1 10*3/uL (ref 0.0–0.1)
Basophils Relative: 1 %
Eosinophils Absolute: 0.4 10*3/uL (ref 0.0–0.5)
Eosinophils Relative: 4 %
HCT: 35.5 % — ABNORMAL LOW (ref 39.0–52.0)
Hemoglobin: 11.2 g/dL — ABNORMAL LOW (ref 13.0–17.0)
Immature Granulocytes: 0 %
Lymphocytes Relative: 14 %
Lymphs Abs: 1.4 10*3/uL (ref 0.7–4.0)
MCH: 23.5 pg — ABNORMAL LOW (ref 26.0–34.0)
MCHC: 31.5 g/dL (ref 30.0–36.0)
MCV: 74.4 fL — ABNORMAL LOW (ref 80.0–100.0)
Monocytes Absolute: 0.7 10*3/uL (ref 0.1–1.0)
Monocytes Relative: 7 %
Neutro Abs: 7.3 10*3/uL (ref 1.7–7.7)
Neutrophils Relative %: 74 %
Platelets: 252 10*3/uL (ref 150–400)
RBC: 4.77 MIL/uL (ref 4.22–5.81)
RDW: 14.6 % (ref 11.5–15.5)
WBC: 9.9 10*3/uL (ref 4.0–10.5)
nRBC: 0 % (ref 0.0–0.2)

## 2019-10-07 LAB — TSH: TSH: 1.731 u[IU]/mL (ref 0.350–4.500)

## 2019-10-07 MED ORDER — SODIUM CHLORIDE 0.9% FLUSH
10.0000 mL | Freq: Once | INTRAVENOUS | Status: AC
  Administered 2019-10-07: 09:00:00 10 mL via INTRAVENOUS
  Filled 2019-10-07: qty 10

## 2019-10-07 MED ORDER — HEPARIN SOD (PORK) LOCK FLUSH 100 UNIT/ML IV SOLN
INTRAVENOUS | Status: AC
  Filled 2019-10-07: qty 5

## 2019-10-07 MED ORDER — IRON SUCROSE 20 MG/ML IV SOLN
200.0000 mg | Freq: Once | INTRAVENOUS | Status: AC
  Administered 2019-10-07: 200 mg via INTRAVENOUS
  Filled 2019-10-07: qty 10

## 2019-10-07 MED ORDER — SODIUM CHLORIDE 0.9 % IV SOLN
200.0000 mg | Freq: Once | INTRAVENOUS | Status: AC
  Administered 2019-10-07: 200 mg via INTRAVENOUS
  Filled 2019-10-07: qty 8

## 2019-10-07 MED ORDER — SODIUM CHLORIDE 0.9 % IV SOLN
Freq: Once | INTRAVENOUS | Status: AC
  Filled 2019-10-07: qty 250

## 2019-10-07 MED ORDER — HEPARIN SOD (PORK) LOCK FLUSH 100 UNIT/ML IV SOLN
500.0000 [IU] | Freq: Once | INTRAVENOUS | Status: AC | PRN
  Administered 2019-10-07: 500 [IU]
  Filled 2019-10-07: qty 5

## 2019-10-07 NOTE — Progress Notes (Signed)
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; # Colon ca- stage III  GOALS: palliative  CURRENT/MOST RECENT THERAPY: Keytruda [C]    Urothelial cancer ([place])   Initial Diagnosis   Urothelial cancer ([place])   Ureteral cancer, right ([place])  05/26/2018 Initial Diagnosis   Ureteral cancer, right ([place])   06/21/2018 -  Chemotherapy   The patient had pembrolizumab (KEYTRUDA) 200 mg in sodium chloride 0.9 % 50 mL chemo infusion, 200 mg, Intravenous, Once, 17 of 22 cycles Administration: 200 mg (06/21/2018), 200 mg (07/12/2018), 200 mg (08/02/2018), 200 mg (08/23/2018), 200 mg (09/13/2018), 200 mg (10/04/2018), 200 mg (10/25/2018), 200 mg (11/22/2018), 200 mg (12/13/2018), 200 mg (01/05/2019), 200 mg (01/24/2019), 200 mg (02/14/2019), 200 mg (03/11/2019), 200 mg (06/17/2019), 200 mg (08/26/2019), 200 mg (09/16/2019), 200 mg (10/07/2019)  for chemotherapy treatment.       HISTORY OF PRESENTING ILLNESS: Patient is a poor historian.  Is alone.  Chad [name] 59 y.o.  male above history of stage IV-ureteral cancer/with multiple co-morbidities currently on Keytruda is here for follow-up.  Patient complains of increased urinary frequency.  Also complains of intermittent burning pain.  No blood in urine.  No blood in stools or black or stools.  Chronic shortness of breath chronic joint pains  Review of Systems  Constitutional: Positive for malaise/fatigue.  Negative for chills, diaphoresis, fever and weight loss.  HENT: Negative for nosebleeds and sore throat.   Eyes: Negative for double vision.  Respiratory: Positive for cough and shortness of breath. Negative for hemoptysis and wheezing.   Cardiovascular: Negative for chest pain, palpitations and orthopnea.  Gastrointestinal: Negative for abdominal pain, blood in stool, constipation, diarrhea, heartburn, melena, nausea and vomiting.  Genitourinary: Negative for dysuria, frequency and urgency.  Musculoskeletal: Positive for back pain and joint pain.  Skin: Negative.  Negative for itching and rash.  Neurological: Positive for focal weakness. Negative for dizziness, tingling, weakness and headaches.       Chronic left-sided weakness upper than lower extremity.  Endo/Heme/Allergies: Does not bruise/bleed easily.  Psychiatric/Behavioral: Negative for depression. The patient is not nervous/anxious and does not have insomnia.      MEDICAL HISTORY:  Past Medical History:  Diagnosis Date  . Anemia   . Anxiety   . ARF (acute respiratory failure) ([place])   . Bladder cancer ([place])   . Cancer [facility])    prostate  . COPD (chronic obstructive pulmonary disease) ([place])   . Depression   . Dysphagia   . Family history of colon cancer   . Family history of kidney cancer   . Family history of leukemia   . Family history of prostate cancer   . GERD (gastroesophageal reflux disease)   . Hepatitis    chronic hep c  . Hydronephrosis   . Hyperlipidemia   . Knee pain    Left  . Malignant neoplasm of colon ([place])   . Nerve pain   . Peripheral vascular disease ([place])   .  Stroke ([place])   . Urinary frequency     SURGICAL HISTORY: Past Surgical History:  Procedure Laterality Date  . COLON SURGERY     En bloc extended right hemicolectomy 07/2017  . CYSTOSCOPY W/ RETROGRADES Right 08/30/2018   Procedure: CYSTOSCOPY WITH RETROGRADE PYELOGRAM;  Surgeon: Hollice Espy, MD;  Location: [facility] ORS;  Service: Urology;   Laterality: Right;  . CYSTOSCOPY WITH STENT PLACEMENT Right 04/25/2018   Procedure: CYSTOSCOPY WITH STENT PLACEMENT;  Surgeon: Hollice Espy, MD;  Location: [facility] OR[facility];  Service: Urology;  Laterality: Right;  . CYSTOSCOPY WITH STENT PLACEMENT Right 08/30/2018   Procedure: [place] WITH STENT Exchange;  Surgeon: Hollice Espy, MD;  Location: [facility] OR[facility];  Service: Urology;  Laterality: Right;  . CYSTOSCOPY WITH STENT PLACEMENT Right 03/07/2019   Procedure: CYSTOSCOPY WITH STENT Exchange;  Surgeon: Hollice Espy, MD;  Location: [facility] OR[facility];  Service: Urology;  Laterality: Right;  . LOWER EXTREMITY ANGIOGRAPHY Left 05/23/2019   Procedure: LOWER EXTREMITY ANGIOGRAPHY;  Surgeon: Algernon Huxley, MD;  Location: [place] [facility];  Service: Cardiovascular;  Laterality: Left;  . LOWER EXTREMITY ANGIOGRAPHY Right 05/30/2019   Procedure: LOWER EXTREMITY ANGIOGRAPHY;  Surgeon: Algernon Huxley, MD;  Location: [place] [facility];  Service: Cardiovascular;  Laterality: Right;  . PORTA CATH INSERTION N/A 02/28/2019   Procedure: PORTA CATH INSERTION;  Surgeon: Algernon Huxley, MD;  Location: [place] [facility];  Service: Cardiovascular;  Laterality: N/A;  . tumor removed       SOCIAL HISTORY: Social History   Socioeconomic History  . Marital status: Single    Spouse name: Not on file  . Number of children: Not on file  . Years of education: Not on file  . Highest education level: Not on file  Occupational History  . Not on file  Tobacco Use  . Smoking status: Current Every Day Smoker    Packs/day: 1.00    Types: Cigarettes  . Smokeless tobacco: Never Used  Substance and Sexual Activity  . Alcohol use: Not Currently  . Drug use: Not Currently  . Sexual activity: Not on file  Other Topics Concern  . Not on file  Social History Narrative    used to live [place]; moved  To [place]- end of April 2019; in Nursing home; 1pp/day; quit alcohol. 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        Family- dad- prostate ca [at 37y];  brother- 71 died of prostate cancer; brother- 44- no cancers [[place]]; sonGerald [name] [[place]];Chad [name]-32y prostate ca [facility] [place]]; daughter- 44 [[place]]; another daughter 74 [NM/addict]. will refer genetics counseling. Given MSI- abnormal; highly suspicious of Lynch syndrome.  Patient's son [name] aware of high possible lynch syndrome.   Social Determinants of Health   Financial Resource Strain:   . Difficulty of Paying Living Expenses:   Food Insecurity:   . Worried About Charity fundraiser in the Last Year:   . Arboriculturist in the Last Year:   Transportation Needs:   . Film/video editor (Medical):   Marland Kitchen Lack of Transportation (Non-Medical):   Physical Activity:   . Days of Exercise per Week:   . Minutes of Exercise per Session:   Stress:   . Feeling of Stress :   Social Connections:   . Frequency of Communication with Friends and Family:   . Frequency of Social Gatherings with Friends and Family:   . Attends Religious Services:   . Active Member of Clubs or Organizations:   .  Attends Archivist Meetings:   Marland Kitchen Marital Status:   Intimate Partner Violence:   . Fear of Current or Ex-Partner:   . Emotionally Abused:   Marland Kitchen Physically Abused:   . Sexually Abused:     FAMILY HISTORY: Family History  Problem Relation Age of Onset  . Prostate cancer Father 107  . Cancer Brother 68       unsure type  . Cancer Paternal Uncle        unsure type  . Cancer Maternal Grandmother        unsure type  . Cancer Paternal Grandmother        unsure type  . Kidney cancer Paternal Grandfather   . Cancer Other        unsure types  . Leukemia Son   . Cancer Son        other cancers, possibly colon    ALLERGIES:  is allergic to penicillins.  MEDICATIONS:  Current Outpatient Medications  Medication Sig Dispense Refill  . acetaminophen (TYLENOL) 325 MG tablet Take 650 mg by mouth 3 (three) times daily.    Marland Kitchen aspirin EC 81 MG tablet Take 1 tablet (81 mg total) by mouth daily.  150 tablet 2  . atorvastatin (LIPITOR) 10 MG tablet Take 1 tablet (10 mg total) by mouth daily. 30 tablet 11  . clopidogrel (PLAVIX) 75 MG tablet Take 1 tablet (75 mg total) by mouth daily. 30 tablet 11  . cyclobenzaprine (FLEXERIL) 10 MG tablet Take 10 mg by mouth at bedtime.    . diclofenac sodium (VOLTAREN) 1 % GEL Apply 2 g topically See admin instructions. Apply 2 g topically 4 times daily. Apply 2 g to left shoulder, apply 2 g to left hip and 2 g to left knee 4 times daily per [facility] instructions    . escitalopram (LEXAPRO) 5 MG tablet Take 5 mg by mouth daily.    Marland Kitchen gabapentin (NEURONTIN) 100 MG capsule Take 100 mg by mouth 3 (three) times daily.    . mirtazapine (REMERON) 7.5 MG tablet Take 7.5 mg by mouth at bedtime.    Marland Kitchen oxycodone (OXY-IR) 5 MG capsule Take 10 mg by mouth every 6 (six) hours as needed.    . pantoprazole (PROTONIX) 40 MG tablet Take 40 mg by mouth daily.    Marland Kitchen sulfamethoxazole-trimethoprim (BACTRIM DS) 800-160 MG tablet Take 1 tablet by mouth 2 (two) times daily for 5 days. 10 tablet 0   No current facility-administered medications for this visit.      Marland Kitchen  PHYSICAL EXAMINATION: ECOG PERFORMANCE STATUS: 1 - Symptomatic but completely ambulatory  Vitals:   10/07/19 0927  BP: 122/81  Pulse: 89  Resp: 20  Temp: (!) 95.3 F (35.2 C)   Filed Weights   10/07/19 0927  Weight: 170 lb 14.4 oz (77.5 kg)    Physical Exam  Constitutional: He is oriented to person, place, and time.  In wheelchair. Cachectic.  Appears older than his stated age.  Alone.  HENT:  Head: Normocephalic and atraumatic.  Mouth/Throat: Oropharynx is clear and moist. No oropharyngeal exudate.  Eyes: Pupils are equal, round, and reactive to light.  Cardiovascular: Normal rate and regular rhythm.  Pulmonary/Chest: No respiratory distress. He has no wheezes.  Abdominal: Soft. Bowel sounds are normal. He exhibits no distension and no mass. There is no abdominal tenderness. There is no rebound and  no guarding.  Musculoskeletal:        General: No tenderness or edema. Normal range of  motion.     Cervical back: Normal range of motion and neck supple.  Neurological: He is oriented to person, place, and time.  Sleepy but easily arousable.  Chronic weakness left upper and lower extremity.  Skin: Skin is warm.  Bilateral lower extremity ulcerations noted.  Pulses intact bilaterally.  Psychiatric: Affect normal.     LABORATORY DATA:  I have reviewed the data as listed Lab Results  Component Value Date   WBC 9.9 10/07/2019   HGB 11.2 (L) 10/07/2019   HCT 35.5 (L) 10/07/2019   MCV 74.4 (L) 10/07/2019   PLT 252 10/07/2019   Recent Labs    08/26/19 0916 09/16/19 1011 10/07/19 0858  NA 136 138 136  K 4.4 4.3 4.3  CL 104 104 105  CO2 '24 25 23  ' GLUCOSE 117* 113* 97  BUN 22* 24* 26*  CREATININE 1.09 1.02 0.92  CALCIUM 8.7* 8.6* 8.7*  GFRNONAA >60 >60 >60  GFRAA >60 >60 >60  PROT 8.0 8.1 8.3*  ALBUMIN 3.7 3.6 3.9  AST 44* 35 32  ALT 41 27 25  ALKPHOS 117 120 111  BILITOT 0.5 0.3 0.7    RADIOGRAPHIC STUDIES: I have personally reviewed the radiological images as listed and agreed with the findings in the report. No results found.  ASSESSMENT & PLAN:   Ureteral cancer, right ([place]) # High-grade urothelial cancer/cytology; likely of the right renal pelvis /upper ureter. JAN 2021- 17th 2020-C/A/P- STABLE;  slight ureteral thickening/small pelvic/retroperitoneal lymph nodes.  No lesions in the liver. Stable.   # Proceed with [place] today; Labs today reviewed;  acceptable for treatment today.   # LFTs-slightly elevated- ? Keytruda vs.others-Hx of hepatitis C. currently improved.  Monitor closely on Keytruda.   # Iron deficiency anemia-hemoglobin 11.5- CHECK iron studies/ferrtin  # Bilateral LE ulcers-s/p wound care evaluation; s/p PTCA with Dr.Dew. bil Arterial Dopp-wnl. STABLE.   # PROSTATISM- check UA/ culture; psa at next visit- will start flomax     #DISPOSITION:  ADD UA & CULTURE # [name] today; and venofer today # follow up in 3 weeks -MD labs-cbc/cmp; PSA;Keytruda/ venofer; Dr.B  Addendum: Urine culture shows-providentia-sensitive to Bactrim.  Ordered Bactrim DS twice a day x for 7 days.  Prescription sent to the nursing home.    All questions were answered. The patient knows to call the clinic with any problems, questions or concerns.    Cammie Sickle, MD 10/13/2019 6:20 AM

## 2019-10-07 NOTE — Assessment & Plan Note (Addendum)
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 JAN 2021- 17th 2020-C/A/P- STABLE;  slight ureteral thickening/small pelvic/retroperitoneal lymph nodes.  No lesions in the liver. Stable.   # Proceed with [place] today; Labs today reviewed;  acceptable for treatment today.   # LFTs-slightly elevated- ? Keytruda vs.others-Hx of hepatitis C. currently improved.  Monitor closely on Keytruda.   # Iron deficiency anemia-hemoglobin 11.5- CHECK iron studies/ferrtin  # Bilateral LE ulcers-s/p wound care evaluation; s/p PTCA with Dr.Dew. bil Arterial Dopp-wnl. STABLE.   # PROSTATISM- check UA/ culture; psa at next visit- will start flomax     #DISPOSITION: ADD UA & CULTURE # [name] today; and venofer today # follow up in 3 weeks -MD labs-cbc/cmp; PSA;Keytruda/ venofer; Dr.B  Addendum: Urine culture shows-providentia-sensitive to Bactrim.  Ordered Bactrim DS twice a day x for 7 days.  Prescription sent to the nursing home.

## 2019-10-07 NOTE — Progress Notes (Signed)
Unable to reconcile patient's medications today. facility mar did not accompany with patient today. Peck was transferred to the nursing station- no answer.

## 2019-10-09 LAB — URINE CULTURE: Culture: >=100000 — AB

## 2019-10-12 ENCOUNTER — Telehealth: Payer: Self-pay | Admitting: *Deleted

## 2019-10-12 MED ORDER — SULFAMETHOXAZOLE-TRIMETHOPRIM 800-160 MG PO TABS: 1.0000 | ORAL_TABLET | Freq: Two times a day (BID) | ORAL | 0 refills | Status: AC

## 2019-10-12 NOTE — Telephone Encounter (Signed)
-----   Message from Cammie Sickle, MD sent at 10/12/2019  3:59 PM EDT ----- H/T- needs Bactrim DS 1 BID x 5 days. This needs to be sent to his facility.  GB

## 2019-10-12 NOTE — Telephone Encounter (Signed)
[facility]. Wynonia Lawman, Therapist, sports for unit. Verbal MD Orders provided for patient to start bactrim DS asap 1 tablet twice daily x 5 days. Original script and orders faxed to [phone]

## 2019-10-14 ENCOUNTER — Encounter: Payer: Self-pay | Admitting: Nurse Practitioner

## 2019-10-14 ENCOUNTER — Other Ambulatory Visit: Payer: Self-pay

## 2019-10-14 ENCOUNTER — Non-Acute Institutional Stay: Payer: Medicaid Other | Admitting: Nurse Practitioner

## 2019-10-14 DIAGNOSIS — Z515 Encounter for palliative care: Secondary | ICD-10-CM

## 2019-10-14 DIAGNOSIS — C661 Malignant neoplasm of right ureter: Secondary | ICD-10-CM

## 2019-10-14 NOTE — Progress Notes (Signed)
Chad [name] Consult Note Telephone: [phone]  Fax: [phone]  PATIENT NAME: Chad [name] DOB: [birth year] MRN: [id number]  PROVIDER:Dr Hodges/[place] and PLAN: 1.ACP: DNR; treat what is treatable including hospitalizations if necessary  2.Anorexia secondary to protein calorie malnutritionrelated to cancerimprovingongoing.Continue to monitor daily weights, supplements, supportive measures and courage to eat  3.Generalized weaknesssecondary to multiple chronic disease progression including anemia, late onset CVA, urothelial and colon cancer.Encourage energy conservation and rest times.  4.Chronic pain monitor on pain scale, monitor efficacy vs adverse side effects. Currently takingscheduled gabapentin; tylenol in addition to oxycodone 10mg  q6hrs/not at maximum usage; will continue to monitor; encouraged to ask when in pain, may consider MsContin with worsening wounds if not managed on maximized dose of Oxycodone; Continue to follow by psychotherapy and psychiatry for counseling; supportive services.  5.Palliative care encounter;Palliative medicine team will continue to support patient, patient's family, and medical team. Visit consisted of counseling and education dealing with the complex and emotionally intense issues of symptom management and palliative care in the setting of serious and potentially life-threatening illness  I spent 35 minutes providing this consultation,  from 8:40am to 9:15am. More than 50% of the time in this consultation was spent coordinating communication.   HISTORY OF PRESENT ILLNESS:  Chad [name] is a 59 y.o. year old male with multiple medical problems including lateonset CVA, urothelial cancer, prostate cancer, chronic hepatitis C, hyperlipidemia, colon surgery, protein calorie malnutrition, anxiety, depression.  Chad.  Chad [name] continues to reside at [place] [facility]. Chad Chad [name] does transfer to the wheelchair on his own, performs adl's, toilets himself. Chad. Chad [name] does continue to feed himself an appetite has been there depending on what's being served. Current weight is 153.5 lb which is a weight gain. BMI 24. Chad Chad [name] does continue to go out to smoke and a client smoking cessation. Chad. Chad [name] is followed by wound care at the facility with last date of service 3 / 15 / 2021 for left malleous Venos measuring 4.5 x 3.5 x 0.2. He is followed by Dentistry at the facility. In addition he is followed by Psychiatry his last day to service 1 / 28 / 2021 for depression for which he is prescribed Remeron with escitalopram. No medication changes at this visit. Chad. Chad [name] was seen at the [facility] 3 / 12 / 2021 by Dr [name], Oncology for stage IV Ureteral cancer with multiple comorbidities currently on Keytruda.  Dr [name] plan: ASSESSMENT & PLAN:   Ureteral cancer, right [facility]) # High-grade urothelial cancer/cytology; likely of the right renal pelvis /upper ureter. JAN 2021- 17th 2020-C/A/P- STABLE;  slight ureteral thickening/small pelvic/retroperitoneal lymph nodes.  No lesions in the liver. Stable.   # Proceed with [place] today; Labs today reviewed;  acceptable for treatment today.   # LFTs-slightly elevated- ? Keytruda vs.others-Hx of hepatitis C. currently improved.  Monitor closely on Keytruda.   # Iron deficiency anemia-hemoglobin 11.5- CHECK iron studies/ferrtin  # Bilateral LE ulcers-s/p wound care evaluation; s/p PTCA with Dr.Dew. bil Arterial Dopp-wnl. STABLE.   # PROSTATISM- check UA/ culture; psa at next visit- will start flomax   Palliative care visit today for follow up. A present Chad. Chad [name] is lying in bed. He appears comfortable. No visitors present. I visit and observed Chad. Chad [name]. Chad Chad [name] sat up on his own on the  side of the bed. They talked about purpose of palliative care  visit. Chad Chad [name] in agreement. We talked about how he's feeling today. Chad Chad [name] endorses that he's doing better. We talked about smoking cessation and he politely declined. Chad Chad [name] endorses that's the one thing that he does enjoy. We talked about social isolation with covid pandemic. We talked about coping strategies. We talked about symptoms of pain what she currently is comfortable. We talked about shortness of breath which he denies. We talked about his appetite. Praised Chad. Chad [name] for gaining weight. Encourage to continue to get out of bed daily. We talked about medical goals of care. Chad Chad [name] does continue to go to the cancer center for treatment. We talked about role of palliative care and plan of care. Therapeutic listening, emotional support provided. Contact information. Questions answered to satisfaction. Will follow up in 4 weeks if needed or sooner said he declined. Chad [name] in agreement. I have updated nursing staff. Palliative Care was asked to help to continue address goals of care.   CODE STATUS: DNR  PPS: 50% HOSPICE ELIGIBILITY/DIAGNOSIS: TBD  PAST MEDICAL HISTORY:  Past Medical History:  Diagnosis Date  . Anemia   . Anxiety   . ARF (acute respiratory failure) ([place])   . Bladder cancer ([place])   . Cancer [facility])    prostate  . COPD (chronic obstructive pulmonary disease) ([place])   . Depression   . Dysphagia   . Family history of colon cancer   . Family history of kidney cancer   . Family history of leukemia   . Family history of prostate cancer   . GERD (gastroesophageal reflux disease)   . Hepatitis    chronic hep c  . Hydronephrosis   . Hyperlipidemia   . Knee pain    Left  . Malignant neoplasm of colon ([place])   . Nerve pain   . Peripheral vascular disease ([place])   . Stroke ([place])   . Urinary frequency     SOCIAL HX:  Social History   Tobacco Use  . Smoking status: Current Every Day  Smoker    Packs/day: 1.00    Types: Cigarettes  . Smokeless tobacco: Never Used  Substance Use Topics  . Alcohol use: Not Currently    ALLERGIES:  Allergies  Allergen Reactions  . Penicillins Rash     PERTINENT MEDICATIONS:  Outpatient Encounter Medications as of 10/14/2019  Medication Sig  . acetaminophen (TYLENOL) 325 MG tablet Take 650 mg by mouth 3 (three) times daily.  Marland Kitchen aspirin EC 81 MG tablet Take 1 tablet (81 mg total) by mouth daily.  Marland Kitchen atorvastatin (LIPITOR) 10 MG tablet Take 1 tablet (10 mg total) by mouth daily.  . clopidogrel (PLAVIX) 75 MG tablet Take 1 tablet (75 mg total) by mouth daily.  . cyclobenzaprine (FLEXERIL) 10 MG tablet Take 10 mg by mouth at bedtime.  . diclofenac sodium (VOLTAREN) 1 % GEL Apply 2 g topically See admin instructions. Apply 2 g topically 4 times daily. Apply 2 g to left shoulder, apply 2 g to left hip and 2 g to left knee 4 times daily per [facility] instructions  . escitalopram (LEXAPRO) 5 MG tablet Take 5 mg by mouth daily.  Marland Kitchen gabapentin (NEURONTIN) 100 MG capsule Take 100 mg by mouth 3 (three) times daily.  . mirtazapine (REMERON) 7.5 MG tablet Take 7.5 mg by mouth at bedtime.  Marland Kitchen oxycodone (OXY-IR) 5 MG capsule Take 10 mg by mouth every 6 (six) hours as needed.  . pantoprazole (PROTONIX) 40 MG tablet Take 40  mg by mouth daily.  Marland Kitchen sulfamethoxazole-trimethoprim (BACTRIM DS) 800-160 MG tablet Take 1 tablet by mouth 2 (two) times daily for 5 days.   No facility-administered encounter medications on file as of 10/14/2019.    PHYSICAL EXAM:   General: NAD, frail appearing, thin, pleasant male Cardiovascular: regular rate and rhythm Pulmonary: clear ant fields Abdomen: soft, nontender, + bowel sounds Extremities: no edema, no joint deformities Neurological: w/c dependent Kami Kube Ihor Gully, NP

## 2019-10-28 ENCOUNTER — Other Ambulatory Visit: Payer: Self-pay

## 2019-10-28 ENCOUNTER — Inpatient Hospital Stay: Payer: Medicaid Other

## 2019-10-28 ENCOUNTER — Inpatient Hospital Stay: Payer: Medicaid Other | Attending: Internal Medicine

## 2019-10-28 ENCOUNTER — Inpatient Hospital Stay (HOSPITAL_BASED_OUTPATIENT_CLINIC_OR_DEPARTMENT_OTHER): Payer: Medicaid Other | Admitting: Internal Medicine

## 2019-10-28 VITALS — BP 115/78 | HR 81 | Temp 97.5°F

## 2019-10-28 DIAGNOSIS — I739 Peripheral vascular disease, unspecified: Secondary | ICD-10-CM | POA: Diagnosis not present

## 2019-10-28 DIAGNOSIS — K219 Gastro-esophageal reflux disease without esophagitis: Secondary | ICD-10-CM | POA: Insufficient documentation

## 2019-10-28 DIAGNOSIS — Z7982 Long term (current) use of aspirin: Secondary | ICD-10-CM | POA: Diagnosis not present

## 2019-10-28 DIAGNOSIS — F17210 Nicotine dependence, cigarettes, uncomplicated: Secondary | ICD-10-CM | POA: Diagnosis not present

## 2019-10-28 DIAGNOSIS — E785 Hyperlipidemia, unspecified: Secondary | ICD-10-CM | POA: Insufficient documentation

## 2019-10-28 DIAGNOSIS — D509 Iron deficiency anemia, unspecified: Secondary | ICD-10-CM | POA: Diagnosis not present

## 2019-10-28 DIAGNOSIS — C661 Malignant neoplasm of right ureter: Secondary | ICD-10-CM | POA: Insufficient documentation

## 2019-10-28 DIAGNOSIS — Z9221 Personal history of antineoplastic chemotherapy: Secondary | ICD-10-CM | POA: Insufficient documentation

## 2019-10-28 DIAGNOSIS — Z79899 Other long term (current) drug therapy: Secondary | ICD-10-CM | POA: Diagnosis not present

## 2019-10-28 DIAGNOSIS — J449 Chronic obstructive pulmonary disease, unspecified: Secondary | ICD-10-CM | POA: Insufficient documentation

## 2019-10-28 DIAGNOSIS — B182 Chronic viral hepatitis C: Secondary | ICD-10-CM | POA: Insufficient documentation

## 2019-10-28 DIAGNOSIS — Z8673 Personal history of transient ischemic attack (TIA), and cerebral infarction without residual deficits: Secondary | ICD-10-CM | POA: Diagnosis not present

## 2019-10-28 DIAGNOSIS — Z8042 Family history of malignant neoplasm of prostate: Secondary | ICD-10-CM | POA: Diagnosis not present

## 2019-10-28 DIAGNOSIS — R64 Cachexia: Secondary | ICD-10-CM | POA: Diagnosis not present

## 2019-10-28 DIAGNOSIS — R5381 Other malaise: Secondary | ICD-10-CM | POA: Diagnosis not present

## 2019-10-28 DIAGNOSIS — D500 Iron deficiency anemia secondary to blood loss (chronic): Secondary | ICD-10-CM

## 2019-10-28 DIAGNOSIS — Z5112 Encounter for antineoplastic immunotherapy: Secondary | ICD-10-CM | POA: Insufficient documentation

## 2019-10-28 DIAGNOSIS — Z95828 Presence of other vascular implants and grafts: Secondary | ICD-10-CM

## 2019-10-28 DIAGNOSIS — Z791 Long term (current) use of non-steroidal anti-inflammatories (NSAID): Secondary | ICD-10-CM | POA: Diagnosis not present

## 2019-10-28 DIAGNOSIS — R5383 Other fatigue: Secondary | ICD-10-CM | POA: Insufficient documentation

## 2019-10-28 DIAGNOSIS — Z515 Encounter for palliative care: Secondary | ICD-10-CM

## 2019-10-28 LAB — PSA: Prostatic Specific Antigen: 4.04 ng/mL — ABNORMAL HIGH (ref 0.00–4.00)

## 2019-10-28 LAB — COMPREHENSIVE METABOLIC PANEL
ALT: 22 U/L (ref 0–44)
AST: 27 U/L (ref 15–41)
Albumin: 3.7 g/dL (ref 3.5–5.0)
Alkaline Phosphatase: 105 U/L (ref 38–126)
Anion gap: 8 (ref 5–15)
BUN: 20 mg/dL (ref 8–23)
CO2: 23 mmol/L (ref 22–32)
Calcium: 8.7 mg/dL — ABNORMAL LOW (ref 8.9–10.3)
Chloride: 104 mmol/L (ref 98–111)
Creatinine, Ser: 1.07 mg/dL (ref 0.61–1.24)
GFR calc Af Amer: >60 mL/min (ref >60)
GFR calc non Af Amer: >60 mL/min (ref >60)
Glucose, Bld: 97 mg/dL (ref 70–99)
Potassium: 4.2 mmol/L (ref 3.5–5.1)
Sodium: 135 mmol/L (ref 135–145)
Total Bilirubin: 0.4 mg/dL (ref 0.3–1.2)
Total Protein: 7.9 g/dL (ref 6.5–8.1)

## 2019-10-28 LAB — CBC WITH DIFFERENTIAL/PLATELET
Abs Immature Granulocytes: 0.01 10*3/uL (ref 0.00–0.07)
Basophils Absolute: 0.1 10*3/uL (ref 0.0–0.1)
Basophils Relative: 1 %
Eosinophils Absolute: 0.4 10*3/uL (ref 0.0–0.5)
Eosinophils Relative: 6 %
HCT: 37.9 % — ABNORMAL LOW (ref 39.0–52.0)
Hemoglobin: 11.9 g/dL — ABNORMAL LOW (ref 13.0–17.0)
Immature Granulocytes: 0 %
Lymphocytes Relative: 18 %
Lymphs Abs: 1.2 10*3/uL (ref 0.7–4.0)
MCH: 23.4 pg — ABNORMAL LOW (ref 26.0–34.0)
MCHC: 31.4 g/dL (ref 30.0–36.0)
MCV: 74.6 fL — ABNORMAL LOW (ref 80.0–100.0)
Monocytes Absolute: 0.5 10*3/uL (ref 0.1–1.0)
Monocytes Relative: 7 %
Neutro Abs: 4.5 10*3/uL (ref 1.7–7.7)
Neutrophils Relative %: 68 %
Platelets: 216 10*3/uL (ref 150–400)
RBC: 5.08 MIL/uL (ref 4.22–5.81)
RDW: 17.1 % — ABNORMAL HIGH (ref 11.5–15.5)
WBC: 6.7 10*3/uL (ref 4.0–10.5)
nRBC: 0 % (ref 0.0–0.2)

## 2019-10-28 MED ORDER — SODIUM CHLORIDE 0.9 % IV SOLN
200.0000 mg | Freq: Once | INTRAVENOUS | Status: AC
  Administered 2019-10-28: 10:00:00 200 mg via INTRAVENOUS
  Filled 2019-10-28: qty 8

## 2019-10-28 MED ORDER — SODIUM CHLORIDE 0.9 % IV SOLN
Freq: Once | INTRAVENOUS | Status: AC
  Filled 2019-10-28: qty 250

## 2019-10-28 MED ORDER — IRON SUCROSE 20 MG/ML IV SOLN
200.0000 mg | Freq: Once | INTRAVENOUS | Status: AC
  Administered 2019-10-28: 10:00:00 200 mg via INTRAVENOUS
  Filled 2019-10-28: qty 10

## 2019-10-28 MED ORDER — SODIUM CHLORIDE 0.9% FLUSH
10.0000 mL | Freq: Once | INTRAVENOUS | Status: AC
  Administered 2019-10-28: 10 mL via INTRAVENOUS
  Filled 2019-10-28: qty 10

## 2019-10-28 MED ORDER — HEPARIN SOD (PORK) LOCK FLUSH 100 UNIT/ML IV SOLN
500.0000 [IU] | Freq: Once | INTRAVENOUS | Status: AC | PRN
  Administered 2019-10-28: 11:00:00 500 [IU]
  Filled 2019-10-28: qty 5

## 2019-10-28 MED ORDER — HEPARIN SOD (PORK) LOCK FLUSH 100 UNIT/ML IV SOLN
INTRAVENOUS | Status: AC
  Filled 2019-10-28: qty 5

## 2019-10-28 NOTE — Progress Notes (Signed)
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; # Colon ca- stage III  GOALS: palliative  CURRENT/MOST RECENT THERAPY: Keytruda [C]    Urothelial cancer ([place])   Initial Diagnosis   Urothelial cancer ([place])   Ureteral cancer, right ([place])  05/26/2018 Initial Diagnosis   Ureteral cancer, right ([place])   06/21/2018 -  Chemotherapy   The patient had pembrolizumab (KEYTRUDA) 200 mg in sodium chloride 0.9 % 50 mL chemo infusion, 200 mg, Intravenous, Once, 18 of 22 cycles Administration: 200 mg (06/21/2018), 200 mg (07/12/2018), 200 mg (08/02/2018), 200 mg (08/23/2018), 200 mg (09/13/2018), 200 mg (10/04/2018), 200 mg (10/25/2018), 200 mg (11/22/2018), 200 mg (12/13/2018), 200 mg (01/05/2019), 200 mg (01/24/2019), 200 mg (02/14/2019), 200 mg (03/11/2019), 200 mg (06/17/2019), 200 mg (08/26/2019), 200 mg (09/16/2019), 200 mg (10/07/2019)  for chemotherapy treatment.       HISTORY OF PRESENTING ILLNESS: Patient is a poor historian.  Is alone.  Chad [name] 59 y.o.  male above history of stage IV-ureteral cancer/with multiple co-morbidities currently on Keytruda is here for follow-up.  Patient was treated for urinary tract infection with Bactrim recently.  Symptoms have improved.  Complains of soft stools this morning.  Otherwise denies any diarrhea.  Urinary frequency improved.  No blood in urine. Chronic shortness of breath chronic cough.  Review of  Systems  Constitutional: Positive for malaise/fatigue. Negative for chills, diaphoresis, fever and weight loss.  HENT: Negative for nosebleeds and sore throat.   Eyes: Negative for double vision.  Respiratory: Positive for cough and shortness of breath. Negative for hemoptysis and wheezing.   Cardiovascular: Negative for chest pain, palpitations and orthopnea.  Gastrointestinal: Negative for abdominal pain, blood in stool, constipation, diarrhea, heartburn, melena, nausea and vomiting.  Genitourinary: Negative for dysuria, frequency and urgency.  Musculoskeletal: Positive for back pain and joint pain.  Skin: Negative.  Negative for itching and rash.  Neurological: Positive for focal weakness. Negative for dizziness, tingling, weakness and headaches.       Chronic left-sided weakness upper than lower extremity.  Endo/Heme/Allergies: Does not bruise/bleed easily.  Psychiatric/Behavioral: Negative for depression. The patient is not nervous/anxious and does not have insomnia.      MEDICAL HISTORY:  Past Medical History:  Diagnosis Date  . Anemia   . Anxiety   . ARF (acute respiratory failure) ([place])   . Bladder cancer ([place])   . Cancer [facility])    prostate  . COPD (chronic obstructive pulmonary disease) ([place])   . Depression   . Dysphagia   . Family history of colon cancer   . Family history of kidney cancer   . Family history of leukemia   . Family history of prostate cancer   . GERD (gastroesophageal reflux disease)   . Hepatitis    chronic hep c  . Hydronephrosis   . Hyperlipidemia   . Knee pain    Left  . Malignant neoplasm of colon ([place])   . Nerve pain   .  Peripheral vascular disease ([place])   . Stroke ([place])   . Urinary frequency     SURGICAL HISTORY: Past Surgical History:  Procedure Laterality Date  . COLON SURGERY     En bloc extended right hemicolectomy 07/2017  . CYSTOSCOPY W/ RETROGRADES Right 08/30/2018   Procedure: CYSTOSCOPY WITH RETROGRADE PYELOGRAM;  Surgeon:  Hollice Espy, MD;  Location: [facility];  Service: Urology;  Laterality: Right;  . CYSTOSCOPY WITH STENT PLACEMENT Right 04/25/2018   Procedure: CYSTOSCOPY WITH STENT PLACEMENT;  Surgeon: Hollice Espy, MD;  Location: [facility];  Service: Urology;  Laterality: Right;  . CYSTOSCOPY WITH STENT PLACEMENT Right 08/30/2018   Procedure: [place] WITH STENT Exchange;  Surgeon: Hollice Espy, MD;  Location: [facility];  Service: Urology;  Laterality: Right;  . CYSTOSCOPY WITH STENT PLACEMENT Right 03/07/2019   Procedure: CYSTOSCOPY WITH STENT Exchange;  Surgeon: Hollice Espy, MD;  Location: [facility];  Service: Urology;  Laterality: Right;  . LOWER EXTREMITY ANGIOGRAPHY Left 05/23/2019   Procedure: LOWER EXTREMITY ANGIOGRAPHY;  Surgeon: Algernon Huxley, MD;  Location: [facility];  Service: Cardiovascular;  Laterality: Left;  . LOWER EXTREMITY ANGIOGRAPHY Right 05/30/2019   Procedure: LOWER EXTREMITY ANGIOGRAPHY;  Surgeon: Algernon Huxley, MD;  Location: [facility];  Service: Cardiovascular;  Laterality: Right;  . PORTA CATH INSERTION N/A 02/28/2019   Procedure: PORTA CATH INSERTION;  Surgeon: Algernon Huxley, MD;  Location: [facility];  Service: Cardiovascular;  Laterality: N/A;  . tumor removed       SOCIAL HISTORY: Social History   Socioeconomic History  . Marital status: Single    Spouse name: Not on file  . Number of children: Not on file  . Years of education: Not on file  . Highest education level: Not on file  Occupational History  . Not on file  Tobacco Use  . Smoking status: Current Every Day Smoker    Packs/day: 1.00    Types: Cigarettes  . Smokeless tobacco: Never Used  Substance and Sexual Activity  . Alcohol use: Not Currently  . Drug use: Not Currently  . Sexual activity: Not on file  Other Topics Concern  . Not on file  Social History Narrative    used to live [place]; moved  To [place]- end of April 2019; in Nursing home; 1pp/day; quit alcohol. 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        Chad [name]- prostate ca [at 89y]; Chad [name] died of prostate cancer; Chad [name]- no cancers [[place]]; Chad [name] [Chad [name]];Chad [name]-30y prostate ca [facility] [place]]; Chad [name] [[place]]; another daughter 72 [NM/addict]. will refer genetics counseling. Given MSI- abnormal; highly suspicious of Lynch syndrome.  Patient's son [name] aware of high possible lynch syndrome.   Social Determinants of Health   Financial Resource Strain:   . Difficulty of Paying Living Expenses:   Food Insecurity:   . Worried About Charity fundraiser in the Last Year:   . Arboriculturist in the Last Year:   Transportation Needs:   . Film/video editor (Medical):   Marland Kitchen Lack of Transportation (Non-Medical):   Physical Activity:   . Days of Exercise per Week:   . Minutes of Exercise per Session:   Stress:   . Feeling of Stress :   Social Connections:   . Frequency of Communication with Friends and Family:   . Frequency of Social Gatherings with Friends and Family:   . Attends Religious Services:   .  Active Member of Clubs or Organizations:   . Attends Archivist Meetings:   Marland Kitchen Marital Status:   Intimate Partner Violence:   . Fear of Current or Ex-Partner:   . Emotionally Abused:   Marland Kitchen Physically Abused:   . Sexually Abused:     FAMILY HISTORY: Family History  Problem Relation Age of Onset  . Prostate cancer Father 76  . Cancer Brother 86       unsure type  . Cancer Paternal Uncle        unsure type  . Cancer Maternal Grandmother        unsure type  . Cancer Paternal Grandmother        unsure type  . Kidney cancer Paternal Grandfather   . Cancer Other        unsure types  . Leukemia Son   . Cancer Son        other cancers, possibly colon    ALLERGIES:  is allergic to penicillins.  MEDICATIONS:  Current Outpatient Medications  Medication Sig Dispense Refill  . acetaminophen (TYLENOL) 325 MG tablet Take 650 mg by mouth 3 (three) times daily.    Marland Kitchen aspirin  EC 81 MG tablet Take 1 tablet (81 mg total) by mouth daily. 150 tablet 2  . atorvastatin (LIPITOR) 10 MG tablet Take 1 tablet (10 mg total) by mouth daily. 30 tablet 11  . clopidogrel (PLAVIX) 75 MG tablet Take 1 tablet (75 mg total) by mouth daily. 30 tablet 11  . cyclobenzaprine (FLEXERIL) 10 MG tablet Take 10 mg by mouth at bedtime.    . diclofenac sodium (VOLTAREN) 1 % GEL Apply 2 g topically See admin instructions. Apply 2 g topically 4 times daily. Apply 2 g to left shoulder, apply 2 g to left hip and 2 g to left knee 4 times daily per [facility] instructions    . escitalopram (LEXAPRO) 5 MG tablet Take 5 mg by mouth daily.    Marland Kitchen gabapentin (NEURONTIN) 100 MG capsule Take 100 mg by mouth 3 (three) times daily.    . mirtazapine (REMERON) 7.5 MG tablet Take 7.5 mg by mouth at bedtime.    Marland Kitchen oxycodone (OXY-IR) 5 MG capsule Take 10 mg by mouth every 6 (six) hours as needed.    . pantoprazole (PROTONIX) 40 MG tablet Take 40 mg by mouth daily.     No current facility-administered medications for this visit.   Facility-Administered Medications Ordered in Other Visits  Medication Dose Route Frequency Provider Last Rate Last Admin  . 0.9 %  sodium chloride infusion   Intravenous Once Charlaine Dalton R, MD      . iron sucrose (VENOFER) injection 200 mg  200 mg Intravenous Once Charlaine Dalton R, MD      . pembrolizumab [facility]) 200 mg in sodium chloride 0.9 % 50 mL chemo infusion  200 mg Intravenous Once Cammie Sickle, MD          .  PHYSICAL EXAMINATION: ECOG PERFORMANCE STATUS: 1 - Symptomatic but completely ambulatory  Vitals:   10/28/19 0921  BP: 108/72  Pulse: 84  Temp: (!) 97.5 F (36.4 C)   Filed Weights   10/28/19 0921  Weight: 167 lb (75.8 kg)    Physical Exam  Constitutional: He is oriented to person, place, and time.  In wheelchair. Cachectic.  Appears older than his stated age.  Alone.  HENT:  Head: Normocephalic and atraumatic.  Mouth/Throat: Oropharynx  is clear and moist. No oropharyngeal exudate.  Eyes: Pupils are equal, round, and reactive to light.  Cardiovascular: Normal rate and regular rhythm.  Pulmonary/Chest: No respiratory distress. He has no wheezes.  Abdominal: Soft. Bowel sounds are normal. He exhibits no distension and no mass. There is no abdominal tenderness. There is no rebound and no guarding.  Musculoskeletal:        General: No tenderness or edema. Normal range of motion.     Cervical back: Normal range of motion and neck supple.  Neurological: He is oriented to person, place, and time.  Sleepy but easily arousable.  Chronic weakness left upper and lower extremity.  Skin: Skin is warm.  Bilateral lower extremity ulcerations noted.  Pulses intact bilaterally.  Psychiatric: Affect normal.     LABORATORY DATA:  I have reviewed the data as listed Lab Results  Component Value Date   WBC 6.7 10/28/2019   HGB 11.9 (L) 10/28/2019   HCT 37.9 (L) 10/28/2019   MCV 74.6 (L) 10/28/2019   PLT 216 10/28/2019   Recent Labs    09/16/19 1011 10/07/19 0858 10/28/19 0855  NA 138 136 135  K 4.3 4.3 4.2  CL 104 105 104  CO2 '25 23 23  ' GLUCOSE 113* 97 97  BUN 24* 26* 20  CREATININE 1.02 0.92 1.07  CALCIUM 8.6* 8.7* 8.7*  GFRNONAA >60 >60 >60  GFRAA >60 >60 >60  PROT 8.1 8.3* 7.9  ALBUMIN 3.6 3.9 3.7  AST 35 32 27  ALT '27 25 22  ' ALKPHOS 120 111 105  BILITOT 0.3 0.7 0.4    RADIOGRAPHIC STUDIES: I have personally reviewed the radiological images as listed and agreed with the findings in the report. No results found.  ASSESSMENT & PLAN:   Ureteral cancer, right ([place]) # High-grade urothelial cancer/cytology; likely of the right renal pelvis /upper ureter. JAN 2021- 17th 2020-C/A/P- Stable; slight ureteral thickening/small pelvic/retroperitoneal lymph nodes.  No lesions in the liver.   # Proceed with [place] today; Labs today reviewed;  acceptable for treatment today. Will order Scan at next visit.   # Iron  deficiency anemia-hemoglobin 11.9-improved. Continue venofer.   # Bilateral LE ulcers-s/p wound care evaluation; s/p PTCA with Dr.Dew. bil Arterial Dopp-wnl. STABLE.   #Recent UTI-status post antibiotics symptoms improved.   #DISPOSITION: # [name] today; and venofer today # follow up in 3 weeks -MD labs-cbc/cmp; ;[place]ake Divine; Dr.B     All questions were answered. The patient knows to call the clinic with any problems, questions or concerns.    Cammie Sickle, MD 10/28/2019 10:05 AM

## 2019-10-28 NOTE — Assessment & Plan Note (Addendum)
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 JAN 2021- 17th 2020-C/A/P- Stable; slight ureteral thickening/small pelvic/retroperitoneal lymph nodes.  No lesions in the liver.   # Proceed with [place] today; Labs today reviewed;  acceptable for treatment today. Will order Scan at next visit.   # Iron deficiency anemia-hemoglobin 11.9-improved. Continue venofer.   # Bilateral LE ulcers-s/p wound care evaluation; s/p PTCA with Dr.Dew. bil Arterial Dopp-wnl. STABLE.   #Recent UTI-status post antibiotics symptoms improved.   #DISPOSITION: # [name] today; and venofer today # follow up in 3 weeks -MD labs-cbc/cmp; ;Keytruda/ venofer; Dr.B

## 2019-11-07 NOTE — Progress Notes (Signed)
11/08/19 8:50 PM   Chad [name] 08/27/1960 [id number]  Referring provider: [name], MD [address] [place],  [place] [address]  Chief Complaint  Patient presents with  . urothelial cancer    HPI: Chad [name] is a 59 y.o. M with metastatic upper tract urothelial carcinoma who returns today to discuss stent exchange.  He continues on Keytruda managed by Dr. Tish Men.  His most recent imaging from 07/2019 is stable.   His last exchange was on 02/2019.   He reports of burning with urination, frequency and urgency once in a while. Overall, not bothersome.  Asymptomatic today.  PMH: Past Medical History:  Diagnosis Date  . Anemia   . Anxiety   . ARF (acute respiratory failure) ([place])   . Bladder cancer ([place])   . Cancer [facility])    prostate  . COPD (chronic obstructive pulmonary disease) ([place])   . Depression   . Dysphagia   . Family history of colon cancer   . Family history of kidney cancer   . Family history of leukemia   . Family history of prostate cancer   . GERD (gastroesophageal reflux disease)   . Hepatitis    chronic hep c  . Hydronephrosis   . Hyperlipidemia   . Knee pain    Left  . Malignant neoplasm of colon ([place])   . Nerve pain   . Peripheral vascular disease ([place])   . Stroke ([place])   . Urinary frequency     Surgical History: Past Surgical History:  Procedure Laterality Date  . COLON SURGERY     En bloc extended right hemicolectomy 07/2017  . CYSTOSCOPY W/ RETROGRADES Right 08/30/2018   Procedure: CYSTOSCOPY WITH RETROGRADE PYELOGRAM;  Surgeon: Hollice Espy, MD;  Location: [facility];  Service: Urology;  Laterality: Right;  . CYSTOSCOPY WITH STENT PLACEMENT Right 04/25/2018   Procedure: CYSTOSCOPY WITH STENT PLACEMENT;  Surgeon: Hollice Espy, MD;  Location: [facility];  Service: Urology;  Laterality: Right;  . CYSTOSCOPY WITH STENT PLACEMENT Right 08/30/2018   Procedure: [place] WITH STENT Exchange;  Surgeon: Hollice Espy, MD;   Location: [facility];  Service: Urology;  Laterality: Right;  . CYSTOSCOPY WITH STENT PLACEMENT Right 03/07/2019   Procedure: CYSTOSCOPY WITH STENT Exchange;  Surgeon: Hollice Espy, MD;  Location: [facility];  Service: Urology;  Laterality: Right;  . LOWER EXTREMITY ANGIOGRAPHY Left 05/23/2019   Procedure: LOWER EXTREMITY ANGIOGRAPHY;  Surgeon: Algernon Huxley, MD;  Location: [facility];  Service: Cardiovascular;  Laterality: Left;  . LOWER EXTREMITY ANGIOGRAPHY Right 05/30/2019   Procedure: LOWER EXTREMITY ANGIOGRAPHY;  Surgeon: Algernon Huxley, MD;  Location: [facility];  Service: Cardiovascular;  Laterality: Right;  . PORTA CATH INSERTION N/A 02/28/2019   Procedure: PORTA CATH INSERTION;  Surgeon: Algernon Huxley, MD;  Location: [facility];  Service: Cardiovascular;  Laterality: N/A;  . tumor removed       Home Medications:  Allergies as of 11/08/2019      Reactions   Penicillins Rash      Medication List       Accurate as of November 08, 2019  8:50 PM. If you have any questions, ask your nurse or doctor.        acetaminophen 325 MG tablet Commonly known as: TYLENOL Take 650 mg by mouth 3 (three) times daily.   aspirin EC 81 MG tablet Take 1 tablet (81 mg total) by mouth daily.   atorvastatin 10 MG tablet Commonly known  as: Lipitor Take 1 tablet (10 mg total) by mouth daily.   clopidogrel 75 MG tablet Commonly known as: Plavix Take 1 tablet (75 mg total) by mouth daily.   cyclobenzaprine 10 MG tablet Commonly known as: FLEXERIL Take 10 mg by mouth at bedtime.   diclofenac sodium 1 % Gel Commonly known as: VOLTAREN Apply 2 g topically See admin instructions. Apply 2 g topically 4 times daily. Apply 2 g to left shoulder, apply 2 g to left hip and 2 g to left knee 4 times daily per [facility] instructions   escitalopram 5 MG tablet Commonly known as: LEXAPRO Take 5 mg by mouth daily.   gabapentin 100 MG capsule Commonly known as: NEURONTIN Take 100 mg by  mouth 3 (three) times daily.   mirtazapine 7.5 MG tablet Commonly known as: REMERON Take 7.5 mg by mouth at bedtime.   oxycodone 5 MG capsule Commonly known as: OXY-IR Take 10 mg by mouth every 6 (six) hours as needed.   pantoprazole 40 MG tablet Commonly known as: PROTONIX Take 40 mg by mouth daily.   pravastatin 10 MG tablet Commonly known as: PRAVACHOL Take by mouth.       Allergies:  Allergies  Allergen Reactions  . Penicillins Rash    Family History: Family History  Problem Relation Age of Onset  . Prostate cancer Father 78  . Cancer Brother 73       unsure type  . Cancer Paternal Uncle        unsure type  . Cancer Maternal Grandmother        unsure type  . Cancer Paternal Grandmother        unsure type  . Kidney cancer Paternal Grandfather   . Cancer Other        unsure types  . Leukemia Son   . Cancer Son        other cancers, possibly colon    Social History:  reports that he has been smoking cigarettes. He has been smoking about 1.00 pack per day. He has never used smokeless tobacco. He reports previous alcohol use. He reports previous drug use.   Physical Exam: BP 104/71   Pulse 85   Ht 5\' 7"  (1.702 m)   Wt 160 lb (72.6 kg)   BMI 25.06 kg/m   Constitutional:  Alert and oriented, No acute distress. HEENT: [place] AT, moist mucus membranes.  Trachea midline, no masses. Cardiovascular: No clubbing, cyanosis, or edema. Respiratory: Normal respiratory effort, no increased work of breathing. Skin: No rashes, bruises or suspicious lesions. Neurologic: Grossly intact, no focal deficits, moving all 4 extremities. Psychiatric: Normal mood and affect.  Laboratory Data:  Lab Results  Component Value Date   CREATININE 1.07 10/28/2019   Urinalysis  Pertinent Imaging: CLINICAL DATA:  History of right ureteral cancer.  EXAM: CT ABDOMEN AND PELVIS WITH CONTRAST  TECHNIQUE: Multidetector CT imaging of the abdomen and pelvis was performed using the  standard protocol following bolus administration of intravenous contrast.  CONTRAST:  185mL OMNIPAQUE IOHEXOL 300 MG/ML  SOLN  COMPARISON:  04/14/2019.  FINDINGS: Lower chest: Unremarkable  Hepatobiliary: No suspicious focal abnormality within the liver parenchyma. There is no evidence for gallstones, gallbladder wall thickening, or pericholecystic fluid. No intrahepatic or extrahepatic biliary dilation.  Pancreas: No focal mass lesion. No dilatation of the main duct. No intraparenchymal cyst. No peripancreatic edema.  Spleen: No splenomegaly. No focal mass lesion.  Adrenals/Urinary Tract: No adrenal nodule or mass.  Nonobstructing stone lower pole  right kidney is stable. Right double-J internal ureteral stent is stable in position. Mild right hydronephrosis with cortical thinning and decreased perfusion noted right kidney. There is wall thickening and ill definition of the right renal pelvis and right UPJ, similar to prior. Stable 15 mm complex cyst interpolar left kidney. Left ureter unremarkable. The urinary bladder appears normal for the degree of distention.  Stomach/Bowel: Stomach is unremarkable. No gastric wall thickening. No evidence of outlet obstruction. Duodenum is normally positioned as is the ligament of Treitz. No small bowel wall thickening. No small bowel dilatation. Status post right hemicolectomy No gross colonic mass. No colonic wall thickening.  Vascular/Lymphatic: There is abdominal aortic atherosclerosis without aneurysm. Upper normal hepatoduodenal ligament lymph nodes are stable. No retroperitoneal lymphadenopathy.  Stable appearance of upper normal pelvic sidewall lymphadenopathy bilaterally including 9 mm short axis left iliac node on 66/2 and 10 mm short axis left pelvic sidewall node on 69/2.  Index right groin node measured previously at 9 mm short axis is stable (75/2).  Index left inguinal node measured previously at 1.5 cm  short axis is stable today (93/2).  Reproductive: Prostate gland unremarkable.  Other: No intraperitoneal free fluid.  Musculoskeletal: No worrisome lytic or sclerotic osseous abnormality.  IMPRESSION: 1. No substantial interval change in exam. The irregular wall thickening noted previously in the right renal pelvis into the region of the right UPJ is similar. No substantial change in the mild right hydronephrosis with decreased perfusion/excretion from the right kidney. Right ureteral stent is stable in position. 2. Stable borderline to mild bilateral pelvic lymphadenopathy. 3.  Aortic Atherosclerois (ICD10-170.0)   Electronically Signed   By: Misty Stanley M.D.   On: 08/24/2019 11:43  I have personally reviewed the images and agree with radiologist interpretation.   Laboratory Data:   Urinalysis UA with pyuria and microscopic blood.   Assessment & Plan:    1. Urothelial cancer Metastatic currently on Keytruda with palliative intent, managed by Dr. [name] Most recent imaging May/2020 stable which is reassuring  2. Right hydronephrosis  No substantial change in mild right hydronephrosis based on CT imaging Discussed the risks and benefits  3. Urinary frequency  Occasionally symptomatic possibly due to stent UA with pyuria and microscopic blood  Likely related to chronic bacterial colonization, will treat w/ Abx based on urine culture findings  Recommended f/u as needed   4. Retained ureteral stent Last exchanged 02/2019 Due for ureteral stent exchange, will consider spacing out stent exchanges depending on degree of encrustation Discussed risk of the procedure including bleeding, infection, damage surrounding structures amongst others Need for preoperative COVID testing was also discussed He is agreeable this plan Preoperative UA/urine culture   [facility] [address] [place], [place] [address] [phone]  I, Lucas Mallow, am acting as a scribe for Dr. Hollice Espy,  I have reviewed the above documentation for accuracy and completeness, and I agree with the above.   Hollice Espy, MD

## 2019-11-07 NOTE — H&P (View-Only) (Signed)
11/08/19 8:50 PM   Chad [name] [birth year] [id number]  Referring provider: [name], MD [address] [place],  [place] [address]  Chief Complaint  Patient presents with  . urothelial cancer    HPI: Chad [name] is a 59 y.o. M with metastatic upper tract urothelial carcinoma who returns today to discuss stent exchange.  He continues on Keytruda managed by Dr. Tish Men.  His most recent imaging from 07/2019 is stable.   His last exchange was on 02/2019.   He reports of burning with urination, frequency and urgency once in a while. Overall, not bothersome.  Asymptomatic today.  PMH: Past Medical History:  Diagnosis Date  . Anemia   . Anxiety   . ARF (acute respiratory failure) ([place])   . Bladder cancer ([place])   . Cancer [facility])    prostate  . COPD (chronic obstructive pulmonary disease) ([place])   . Depression   . Dysphagia   . Family history of colon cancer   . Family history of kidney cancer   . Family history of leukemia   . Family history of prostate cancer   . GERD (gastroesophageal reflux disease)   . Hepatitis    chronic hep c  . Hydronephrosis   . Hyperlipidemia   . Knee pain    Left  . Malignant neoplasm of colon ([place])   . Nerve pain   . Peripheral vascular disease ([place])   . Stroke ([place])   . Urinary frequency     Surgical History: Past Surgical History:  Procedure Laterality Date  . COLON SURGERY     En bloc extended right hemicolectomy 07/2017  . CYSTOSCOPY W/ RETROGRADES Right 08/30/2018   Procedure: CYSTOSCOPY WITH RETROGRADE PYELOGRAM;  Surgeon: Hollice Espy, MD;  Location: [facility] ORS;  Service: Urology;  Laterality: Right;  . CYSTOSCOPY WITH STENT PLACEMENT Right 04/25/2018   Procedure: CYSTOSCOPY WITH STENT PLACEMENT;  Surgeon: Hollice Espy, MD;  Location: [facility] ORS;  Service: Urology;  Laterality: Right;  . CYSTOSCOPY WITH STENT PLACEMENT Right 08/30/2018   Procedure: [place] WITH STENT Exchange;  Surgeon: Hollice Espy, MD;   Location: [facility] OR[facility];  Service: Urology;  Laterality: Right;  . CYSTOSCOPY WITH STENT PLACEMENT Right 03/07/2019   Procedure: CYSTOSCOPY WITH STENT Exchange;  Surgeon: Hollice Espy, MD;  Location: [facility] OR[facility];  Service: Urology;  Laterality: Right;  . LOWER EXTREMITY ANGIOGRAPHY Left 05/23/2019   Procedure: LOWER EXTREMITY ANGIOGRAPHY;  Surgeon: Algernon Huxley, MD;  Location: [place] [facility];  Service: Cardiovascular;  Laterality: Left;  . LOWER EXTREMITY ANGIOGRAPHY Right 05/30/2019   Procedure: LOWER EXTREMITY ANGIOGRAPHY;  Surgeon: Algernon Huxley, MD;  Location: [place] [facility];  Service: Cardiovascular;  Laterality: Right;  . PORTA CATH INSERTION N/A 02/28/2019   Procedure: PORTA CATH INSERTION;  Surgeon: Algernon Huxley, MD;  Location: [place] [facility];  Service: Cardiovascular;  Laterality: N/A;  . tumor removed       Home Medications:  Allergies as of 11/08/2019      Reactions   Penicillins Rash      Medication List       Accurate as of November 08, 2019  8:50 PM. If you have any questions, ask your nurse or doctor.        acetaminophen 325 MG tablet Commonly known as: TYLENOL Take 650 mg by mouth 3 (three) times daily.   aspirin EC 81 MG tablet Take 1 tablet (81 mg total) by mouth daily.   atorvastatin 10 MG tablet Commonly known  as: Lipitor Take 1 tablet (10 mg total) by mouth daily.   clopidogrel 75 MG tablet Commonly known as: Plavix Take 1 tablet (75 mg total) by mouth daily.   cyclobenzaprine 10 MG tablet Commonly known as: FLEXERIL Take 10 mg by mouth at bedtime.   diclofenac sodium 1 % Gel Commonly known as: VOLTAREN Apply 2 g topically See admin instructions. Apply 2 g topically 4 times daily. Apply 2 g to left shoulder, apply 2 g to left hip and 2 g to left knee 4 times daily per [facility] instructions   escitalopram 5 MG tablet Commonly known as: LEXAPRO Take 5 mg by mouth daily.   gabapentin 100 MG capsule Commonly known as: NEURONTIN Take 100 mg by  mouth 3 (three) times daily.   mirtazapine 7.5 MG tablet Commonly known as: REMERON Take 7.5 mg by mouth at bedtime.   oxycodone 5 MG capsule Commonly known as: OXY-IR Take 10 mg by mouth every 6 (six) hours as needed.   pantoprazole 40 MG tablet Commonly known as: PROTONIX Take 40 mg by mouth daily.   pravastatin 10 MG tablet Commonly known as: PRAVACHOL Take by mouth.       Allergies:  Allergies  Allergen Reactions  . Penicillins Rash    Family History: Family History  Problem Relation Age of Onset  . Prostate cancer Father 71  . Cancer Brother 22       unsure type  . Cancer Paternal Uncle        unsure type  . Cancer Maternal Grandmother        unsure type  . Cancer Paternal Grandmother        unsure type  . Kidney cancer Paternal Grandfather   . Cancer Other        unsure types  . Leukemia Son   . Cancer Son        other cancers, possibly colon    Social History:  reports that he has been smoking cigarettes. He has been smoking about 1.00 pack per day. He has never used smokeless tobacco. He reports previous alcohol use. He reports previous drug use.   Physical Exam: BP 104/71   Pulse 85   Ht 5\' 7"  (1.702 m)   Wt 160 lb (72.6 kg)   BMI 25.06 kg/m   Constitutional:  Alert and oriented, No acute distress. HEENT: [place] AT, moist mucus membranes.  Trachea midline, no masses. Cardiovascular: No clubbing, cyanosis, or edema. Respiratory: Normal respiratory effort, no increased work of breathing. Skin: No rashes, bruises or suspicious lesions. Neurologic: Grossly intact, no focal deficits, moving all 4 extremities. Psychiatric: Normal mood and affect.  Laboratory Data:  Lab Results  Component Value Date   CREATININE 1.07 10/28/2019   Urinalysis  Pertinent Imaging: CLINICAL DATA:  History of right ureteral cancer.  EXAM: CT ABDOMEN AND PELVIS WITH CONTRAST  TECHNIQUE: Multidetector CT imaging of the abdomen and pelvis was performed using the  standard protocol following bolus administration of intravenous contrast.  CONTRAST:  154mL OMNIPAQUE IOHEXOL 300 MG/ML  SOLN  COMPARISON:  04/14/2019.  FINDINGS: Lower chest: Unremarkable  Hepatobiliary: No suspicious focal abnormality within the liver parenchyma. There is no evidence for gallstones, gallbladder wall thickening, or pericholecystic fluid. No intrahepatic or extrahepatic biliary dilation.  Pancreas: No focal mass lesion. No dilatation of the main duct. No intraparenchymal cyst. No peripancreatic edema.  Spleen: No splenomegaly. No focal mass lesion.  Adrenals/Urinary Tract: No adrenal nodule or mass.  Nonobstructing stone lower pole  right kidney is stable. Right double-J internal ureteral stent is stable in position. Mild right hydronephrosis with cortical thinning and decreased perfusion noted right kidney. There is wall thickening and ill definition of the right renal pelvis and right UPJ, similar to prior. Stable 15 mm complex cyst interpolar left kidney. Left ureter unremarkable. The urinary bladder appears normal for the degree of distention.  Stomach/Bowel: Stomach is unremarkable. No gastric wall thickening. No evidence of outlet obstruction. Duodenum is normally positioned as is the ligament of Treitz. No small bowel wall thickening. No small bowel dilatation. Status post right hemicolectomy No gross colonic mass. No colonic wall thickening.  Vascular/Lymphatic: There is abdominal aortic atherosclerosis without aneurysm. Upper normal hepatoduodenal ligament lymph nodes are stable. No retroperitoneal lymphadenopathy.  Stable appearance of upper normal pelvic sidewall lymphadenopathy bilaterally including 9 mm short axis left iliac node on 66/2 and 10 mm short axis left pelvic sidewall node on 69/2.  Index right groin node measured previously at 9 mm short axis is stable (75/2).  Index left inguinal node measured previously at 1.5 cm  short axis is stable today (93/2).  Reproductive: Prostate gland unremarkable.  Other: No intraperitoneal free fluid.  Musculoskeletal: No worrisome lytic or sclerotic osseous abnormality.  IMPRESSION: 1. No substantial interval change in exam. The irregular wall thickening noted previously in the right renal pelvis into the region of the right UPJ is similar. No substantial change in the mild right hydronephrosis with decreased perfusion/excretion from the right kidney. Right ureteral stent is stable in position. 2. Stable borderline to mild bilateral pelvic lymphadenopathy. 3.  Aortic Atherosclerois (ICD10-170.0)   Electronically Signed   By: Misty Stanley M.D.   On: 08/24/2019 11:43  I have personally reviewed the images and agree with radiologist interpretation.   Laboratory Data:   Urinalysis UA with pyuria and microscopic blood.   Assessment & Plan:    1. Urothelial cancer Metastatic currently on Keytruda with palliative intent, managed by Dr. [name] Most recent imaging May/2020 stable which is reassuring  2. Right hydronephrosis  No substantial change in mild right hydronephrosis based on CT imaging Discussed the risks and benefits  3. Urinary frequency  Occasionally symptomatic possibly due to stent UA with pyuria and microscopic blood  Likely related to chronic bacterial colonization, will treat w/ Abx based on urine culture findings  Recommended f/u as needed   4. Retained ureteral stent Last exchanged 02/2019 Due for ureteral stent exchange, will consider spacing out stent exchanges depending on degree of encrustation Discussed risk of the procedure including bleeding, infection, damage surrounding structures amongst others Need for preoperative COVID testing was also discussed He is agreeable this plan Preoperative UA/urine culture   [facility] [address] [place], [place] [address] [phone]  I, Lucas Mallow, am acting as a scribe for Dr. Hollice Espy,  I have reviewed the above documentation for accuracy and completeness, and I agree with the above.   Hollice Espy, MD

## 2019-11-08 ENCOUNTER — Other Ambulatory Visit: Payer: Self-pay | Admitting: Radiology

## 2019-11-08 ENCOUNTER — Other Ambulatory Visit: Payer: Self-pay

## 2019-11-08 ENCOUNTER — Ambulatory Visit (INDEPENDENT_AMBULATORY_CARE_PROVIDER_SITE_OTHER): Payer: Medicaid Other | Admitting: Urology

## 2019-11-08 VITALS — BP 104/71 | HR 85 | Ht 67.0 in | Wt 160.0 lb

## 2019-11-08 DIAGNOSIS — R350 Frequency of micturition: Secondary | ICD-10-CM

## 2019-11-08 DIAGNOSIS — C689 Malignant neoplasm of urinary organ, unspecified: Secondary | ICD-10-CM | POA: Diagnosis not present

## 2019-11-08 LAB — MICROSCOPIC EXAMINATION: WBC, UA: >30 /hpf — AB (ref 0–5)

## 2019-11-08 LAB — URINALYSIS, COMPLETE
Bilirubin, UA: NEGATIVE
Glucose, UA: NEGATIVE
Ketones, UA: NEGATIVE
Nitrite, UA: POSITIVE — AB
Specific Gravity, UA: 1.025 (ref 1.005–1.030)
Urobilinogen, Ur: 0.2 mg/dL (ref 0.2–1.0)
pH, UA: 7 (ref 5.0–7.5)

## 2019-11-10 NOTE — Progress Notes (Signed)
Pharmacist Chemotherapy Monitoring - Follow Up Assessment    I verify that I have reviewed each item in the below checklist:  . Regimen for the patient is scheduled for the appropriate day and plan matches scheduled date. Marland Kitchen Appropriate non-routine labs are ordered dependent on drug ordered. . If applicable, additional medications reviewed and ordered per protocol based on lifetime cumulative doses and/or treatment regimen.   Plan for follow-up and/or issues identified: No . I-vent associated with next due treatment: No . MD and/or nursing notified: No  Adelina Mings 11/10/2019 1:36 PM

## 2019-11-12 LAB — CULTURE, URINE COMPREHENSIVE

## 2019-11-14 ENCOUNTER — Telehealth: Payer: Self-pay | Admitting: Radiology

## 2019-11-14 ENCOUNTER — Other Ambulatory Visit: Payer: Self-pay | Admitting: Radiology

## 2019-11-14 DIAGNOSIS — N390 Urinary tract infection, site not specified: Secondary | ICD-10-CM

## 2019-11-14 DIAGNOSIS — C689 Malignant neoplasm of urinary organ, unspecified: Secondary | ICD-10-CM

## 2019-11-14 MED ORDER — CEFTRIAXONE SODIUM 1 G IJ SOLR: 1.0000 g | INTRAMUSCULAR | 0 refills | Status: DC

## 2019-11-14 NOTE — Telephone Encounter (Signed)
-----   Message from Hollice Espy, MD sent at 11/14/2019  3:37 PM EDT ----- This patient is asymptomatic but grew multiple species which are fairly resistant.  I like to decrease his bacterial load prior to surgery.  Ideally, would like him to have ceftriaxone for 3 days prior to the procedure as well as ceftriaxone intraoperatively.  Please see if this can be arranged [facility] otherwise we may need to change the time and date of his appointment or call for some sort of alternative plan.  Hollice Espy, MD

## 2019-11-14 NOTE — Telephone Encounter (Signed)
Order for rocephin 1g IM daily x 3 days to begin on 11/18/2019 faxed to and discussed with Fatmata at [facility].

## 2019-11-15 ENCOUNTER — Encounter
Admission: RE | Admit: 2019-11-15 | Discharge: 2019-11-15 | Disposition: A | Discharge status: Home / Self Care | Payer: Medicaid Other | Source: Ambulatory Visit | Attending: Urology | Admitting: Urology

## 2019-11-15 ENCOUNTER — Other Ambulatory Visit: Payer: Self-pay

## 2019-11-15 HISTORY — DX: Hydronephrosis with ureteral stricture, not elsewhere classified: N13.1

## 2019-11-15 HISTORY — DX: Chronic venous hypertension (idiopathic) without complications of bilateral lower extremity: I87.303

## 2019-11-15 NOTE — Patient Instructions (Addendum)
Friday, March 23: Between 8 am - 1 pm Come to the pre-admission testing department through the [place] entrance to get an EKG done.  After that, go through the [place] through at the [facility] entrance.    Your surgery is scheduled on: Monday, April 26 Report to Day Surgery on the 2nd floor of the [facility]. To find out your arrival time, please call [phone] between 1PM - 3PM on: Friday, April 23  REMEMBER: Instructions that are not followed completely may result in serious medical risk, up to and including death; or upon the discretion of your surgeon and anesthesiologist your surgery may need to be rescheduled.  Do not eat food after midnight the night before surgery.  No gum chewing, lozengers or hard candies.  You may however, drink CLEAR liquids up to 2 hours before you are scheduled to arrive for your surgery. Do not drink anything within 2 hours of the start of your surgery.  Clear liquids include: - water  - apple juice without pulp - gatorade (not RED) - black coffee or tea (Do NOT add milk or creamers to the coffee or tea) Do NOT drink anything that is not on this list.  TAKE THESE MEDICATIONS THE MORNING OF SURGERY WITH A SIP OF WATER:  1.  Tylenol 2.  Lexapro 3.  Gabapentin 4.  Pantoprazole - (take one the night before and one on the morning of surgery - helps to prevent nausea after surgery.)  Follow recommendations from Cardiologist, Pulmonologist or PCP regarding stopping Aspirin and Plavix. Last day to take is Monday, April 19. Resume after surgery as instructed.  Stop Anti-inflammatories (NSAIDS) such as Advil, Aleve, Ibuprofen, Motrin, Naproxen, Naprosyn and Aspirin based products such as Excedrin, Goodys Powder, BC Powder. (May take Tylenol or Acetaminophen if needed.)  Stop ANY OVER THE COUNTER supplements until after surgery.  No Alcohol for 24 hours before or after surgery.  No Smoking including e-cigarettes for 24 hours  prior to surgery.  No chewable tobacco products for at least 6 hours prior to surgery.  No nicotine patches on the day of surgery.  On the morning of surgery brush your teeth with toothpaste and water, you may rinse your mouth with mouthwash if you wish. Do not swallow any toothpaste or mouthwash.  Do not wear jewely.  Do not wear lotions, powders, or perfumes.   Do not shave 48 hours prior to surgery.   Contact lenses, hearing aids and dentures may not be worn into surgery.  Do not bring valuables to the hospital, including drivers license, insurance or credit cards.  [place] is not responsible for any belongings or valuables.   Notify your doctor if there is any change in your medical condition (cold, fever, infection).  Wear comfortable clothing (specific to your surgery type) to the hospital.  If you are being discharged the day of surgery, you will not be allowed to drive home. You will need a responsible adult to drive you home and stay with you that night.   If you are taking public transportation, you will need to have a responsible adult with you. Please confirm with your physician that it is acceptable to use public transportation.   Please call [phone] if you have any questions about these instructions.  Visitation Policy:  Patients undergoing a surgery or procedure in a hospital may have one family member or support person with them as long as that person is not COVID-19 positive or  experiencing its symptoms. That person may remain in the waiting area during the procedure. Should the patient need to stay at the hospital during part of their recovery, the support person may visit during visiting hours; 7 am to 8 pm.

## 2019-11-17 ENCOUNTER — Telehealth: Payer: Self-pay | Admitting: *Deleted

## 2019-11-17 NOTE — Telephone Encounter (Signed)
Apts have been r/s. Facility notified.

## 2019-11-17 NOTE — Telephone Encounter (Signed)
msg from scheduler. Tasha @ [facility] contacted cancer center. [phone].  patient has upcoming cystocopy planned next week. His covid testing an and preop screening is on Friday 4/26.  [place] [place] - Please cancel apts tomorrow in the cancer center. Reschedule lab/md/keytruda/venofer next Thursday or Friday. Contact Tasha @ [facility] with new apts.

## 2019-11-18 ENCOUNTER — Other Ambulatory Visit: Payer: Medicaid Other

## 2019-11-18 ENCOUNTER — Inpatient Hospital Stay: Payer: Medicaid Other

## 2019-11-18 ENCOUNTER — Inpatient Hospital Stay: Payer: Medicaid Other | Admitting: Internal Medicine

## 2019-11-18 ENCOUNTER — Other Ambulatory Visit: Payer: Self-pay

## 2019-11-18 ENCOUNTER — Encounter
Admission: RE | Admit: 2019-11-18 | Discharge: 2019-11-18 | Disposition: A | Discharge status: Home / Self Care | Payer: Medicaid Other | Source: Ambulatory Visit | Attending: Urology | Admitting: Urology

## 2019-11-18 DIAGNOSIS — Z20822 Contact with and (suspected) exposure to covid-19: Secondary | ICD-10-CM | POA: Insufficient documentation

## 2019-11-18 DIAGNOSIS — Z01812 Encounter for preprocedural laboratory examination: Secondary | ICD-10-CM | POA: Diagnosis not present

## 2019-11-18 LAB — SARS CORONAVIRUS 2 (TAT 6-24 HRS): SARS Coronavirus 2: NEGATIVE

## 2019-11-18 MED ORDER — LACTATED RINGERS IV SOLN: INTRAVENOUS | Status: DC

## 2019-11-21 ENCOUNTER — Encounter: Payer: Self-pay | Admitting: Urology

## 2019-11-21 ENCOUNTER — Other Ambulatory Visit: Payer: Self-pay

## 2019-11-21 ENCOUNTER — Ambulatory Visit: Payer: Medicaid Other | Admitting: Certified Registered"

## 2019-11-21 ENCOUNTER — Ambulatory Visit: Payer: Medicaid Other

## 2019-11-21 ENCOUNTER — Encounter
Admission: RE | Disposition: A | Discharge status: Skilled Nursing Facility | Payer: Self-pay | Source: Home / Self Care | Attending: Urology

## 2019-11-21 ENCOUNTER — Ambulatory Visit
Admission: RE | Admit: 2019-11-21 | Discharge: 2019-11-21 | Disposition: A | Discharge status: Skilled Nursing Facility | Payer: Medicaid Other | Attending: Urology | Admitting: Urology

## 2019-11-21 DIAGNOSIS — I739 Peripheral vascular disease, unspecified: Secondary | ICD-10-CM | POA: Diagnosis not present

## 2019-11-21 DIAGNOSIS — F17210 Nicotine dependence, cigarettes, uncomplicated: Secondary | ICD-10-CM | POA: Insufficient documentation

## 2019-11-21 DIAGNOSIS — Z79899 Other long term (current) drug therapy: Secondary | ICD-10-CM | POA: Insufficient documentation

## 2019-11-21 DIAGNOSIS — J449 Chronic obstructive pulmonary disease, unspecified: Secondary | ICD-10-CM | POA: Insufficient documentation

## 2019-11-21 DIAGNOSIS — F419 Anxiety disorder, unspecified: Secondary | ICD-10-CM | POA: Insufficient documentation

## 2019-11-21 DIAGNOSIS — Z8546 Personal history of malignant neoplasm of prostate: Secondary | ICD-10-CM | POA: Insufficient documentation

## 2019-11-21 DIAGNOSIS — N390 Urinary tract infection, site not specified: Secondary | ICD-10-CM

## 2019-11-21 DIAGNOSIS — N1330 Unspecified hydronephrosis: Secondary | ICD-10-CM | POA: Insufficient documentation

## 2019-11-21 DIAGNOSIS — Z8673 Personal history of transient ischemic attack (TIA), and cerebral infarction without residual deficits: Secondary | ICD-10-CM | POA: Diagnosis not present

## 2019-11-21 DIAGNOSIS — K219 Gastro-esophageal reflux disease without esophagitis: Secondary | ICD-10-CM | POA: Insufficient documentation

## 2019-11-21 DIAGNOSIS — Z85038 Personal history of other malignant neoplasm of large intestine: Secondary | ICD-10-CM | POA: Insufficient documentation

## 2019-11-21 DIAGNOSIS — Z7902 Long term (current) use of antithrombotics/antiplatelets: Secondary | ICD-10-CM | POA: Insufficient documentation

## 2019-11-21 DIAGNOSIS — Z7982 Long term (current) use of aspirin: Secondary | ICD-10-CM | POA: Insufficient documentation

## 2019-11-21 DIAGNOSIS — E785 Hyperlipidemia, unspecified: Secondary | ICD-10-CM | POA: Insufficient documentation

## 2019-11-21 DIAGNOSIS — B182 Chronic viral hepatitis C: Secondary | ICD-10-CM | POA: Insufficient documentation

## 2019-11-21 DIAGNOSIS — C7911 Secondary malignant neoplasm of bladder: Secondary | ICD-10-CM | POA: Insufficient documentation

## 2019-11-21 DIAGNOSIS — C689 Malignant neoplasm of urinary organ, unspecified: Secondary | ICD-10-CM

## 2019-11-21 HISTORY — PX: CYSTOSCOPY WITH STENT PLACEMENT: SHX5790

## 2019-11-21 SURGERY — CYSTOSCOPY, WITH STENT INSERTION
Anesthesia: General | Laterality: Right

## 2019-11-21 MED ORDER — FENTANYL CITRATE (PF) 100 MCG/2ML IJ SOLN: 25.0000 ug | INTRAMUSCULAR | Status: DC | PRN

## 2019-11-21 MED ORDER — DEXAMETHASONE SODIUM PHOSPHATE 10 MG/ML IJ SOLN
INTRAMUSCULAR | Status: DC | PRN
  Administered 2019-11-21: 10 mg via INTRAVENOUS

## 2019-11-21 MED ORDER — ROCURONIUM BROMIDE 10 MG/ML (PF) SYRINGE
PREFILLED_SYRINGE | INTRAVENOUS | Status: AC
  Filled 2019-11-21: qty 20

## 2019-11-21 MED ORDER — ONDANSETRON HCL 4 MG/2ML IJ SOLN
INTRAMUSCULAR | Status: AC
  Filled 2019-11-21: qty 2

## 2019-11-21 MED ORDER — ROCURONIUM BROMIDE 100 MG/10ML IV SOLN
INTRAVENOUS | Status: DC | PRN
  Administered 2019-11-21: 10 mg via INTRAVENOUS

## 2019-11-21 MED ORDER — SUCCINYLCHOLINE CHLORIDE 200 MG/10ML IV SOSY
PREFILLED_SYRINGE | INTRAVENOUS | Status: AC
  Filled 2019-11-21: qty 20

## 2019-11-21 MED ORDER — ONDANSETRON HCL 4 MG/2ML IJ SOLN
INTRAMUSCULAR | Status: DC | PRN
  Administered 2019-11-21: 4 mg via INTRAVENOUS

## 2019-11-21 MED ORDER — DEXAMETHASONE SODIUM PHOSPHATE 10 MG/ML IJ SOLN
INTRAMUSCULAR | Status: AC
  Filled 2019-11-21: qty 1

## 2019-11-21 MED ORDER — GLYCOPYRROLATE 0.2 MG/ML IJ SOLN
INTRAMUSCULAR | Status: AC
  Filled 2019-11-21: qty 1

## 2019-11-21 MED ORDER — GLYCOPYRROLATE 0.2 MG/ML IJ SOLN
INTRAMUSCULAR | Status: DC | PRN
  Administered 2019-11-21: .2 mg via INTRAVENOUS

## 2019-11-21 MED ORDER — SUCCINYLCHOLINE CHLORIDE 20 MG/ML IJ SOLN
INTRAMUSCULAR | Status: DC | PRN
  Administered 2019-11-21: 100 mg via INTRAVENOUS

## 2019-11-21 MED ORDER — SODIUM CHLORIDE 0.9 % IV SOLN
1.0000 g | Freq: Once | INTRAVENOUS | Status: AC
  Administered 2019-11-21: 12:00:00 1 g via INTRAVENOUS
  Filled 2019-11-21: qty 1

## 2019-11-21 MED ORDER — ONDANSETRON HCL 4 MG/2ML IJ SOLN: 4.0000 mg | Freq: Once | INTRAMUSCULAR | Status: DC | PRN

## 2019-11-21 MED ORDER — PROPOFOL 10 MG/ML IV BOLUS
INTRAVENOUS | Status: DC | PRN
  Administered 2019-11-21: 140 mg via INTRAVENOUS

## 2019-11-21 MED ORDER — FENTANYL CITRATE (PF) 100 MCG/2ML IJ SOLN
INTRAMUSCULAR | Status: AC
  Filled 2019-11-21: qty 2

## 2019-11-21 MED ORDER — SODIUM CHLORIDE 0.9 % IV SOLN
INTRAVENOUS | Status: DC
  Administered 2019-11-21: 10:00:00 75 mL/h via INTRAVENOUS

## 2019-11-21 MED ORDER — LIDOCAINE HCL (CARDIAC) PF 100 MG/5ML IV SOSY
PREFILLED_SYRINGE | INTRAVENOUS | Status: DC | PRN
  Administered 2019-11-21: 100 mg via INTRAVENOUS

## 2019-11-21 MED ORDER — FENTANYL CITRATE (PF) 100 MCG/2ML IJ SOLN
INTRAMUSCULAR | Status: DC | PRN
  Administered 2019-11-21: 25 ug via INTRAVENOUS

## 2019-11-21 MED ORDER — MIDAZOLAM HCL 2 MG/2ML IJ SOLN
INTRAMUSCULAR | Status: AC
  Filled 2019-11-21: qty 2

## 2019-11-21 MED ORDER — SUGAMMADEX SODIUM 200 MG/2ML IV SOLN
INTRAVENOUS | Status: DC | PRN
  Administered 2019-11-21: 200 mg via INTRAVENOUS

## 2019-11-21 SURGICAL SUPPLY — 18 items
BAG DRAIN CYSTO-URO LG1000N (MISCELLANEOUS) ×2 IMPLANT
BRUSH SCRUB EZ 1% IODOPHOR (MISCELLANEOUS) ×2 IMPLANT
CATH URETL 5X70 OPEN END (CATHETERS) ×2 IMPLANT
GLOVE BIO SURGEON STRL SZ 6.5 (GLOVE) ×2 IMPLANT
GOWN STRL REUS W/ TWL LRG LVL3 (GOWN DISPOSABLE) ×2 IMPLANT
GOWN STRL REUS W/TWL LRG LVL3 (GOWN DISPOSABLE) ×2
GUIDEWIRE STR DUAL SENSOR (WIRE) ×2 IMPLANT
KIT TURNOVER CYSTO (KITS) ×2 IMPLANT
PACK CYSTO AR (MISCELLANEOUS) ×2 IMPLANT
SET CYSTO W/LG BORE CLAMP LF (SET/KITS/TRAYS/PACK) ×2 IMPLANT
SOL .9 NS 3000ML IRR  AL (IV SOLUTION) ×1
SOL .9 NS 3000ML IRR UROMATIC (IV SOLUTION) ×1 IMPLANT
STENT URET 6FRX24 CONTOUR (STENTS) IMPLANT
STENT URET 6FRX26 CONTOUR (STENTS) IMPLANT
STENT URO INLAY 6FRX26CM (STENTS) ×2 IMPLANT
SURGILUBE 2OZ TUBE FLIPTOP (MISCELLANEOUS) ×2 IMPLANT
SYRINGE IRR TOOMEY STRL 70CC (SYRINGE) ×2 IMPLANT
WATER STERILE IRR 1000ML POUR (IV SOLUTION) ×2 IMPLANT

## 2019-11-21 NOTE — Anesthesia Procedure Notes (Signed)
Procedure Name: Intubation Performed by: Kelton Pillar, CRNA Pre-anesthesia Checklist: Patient identified, Emergency Drugs available, Suction available and Patient being monitored Patient Re-evaluated:Patient Re-evaluated prior to induction Oxygen Delivery Method: Circle system utilized Preoxygenation: Pre-oxygenation with 100% oxygen Induction Type: IV induction Ventilation: Mask ventilation without difficulty Laryngoscope Size: McGraph and 3 Grade View: Grade I Tube type: Oral Tube size: 7.0 mm Airway Equipment and Method: Stylet Placement Confirmation: ETT inserted through vocal cords under direct vision,  positive ETCO2,  breath sounds checked- equal and bilateral and CO2 detector Secured at: 21 cm Tube secured with: Tape Dental Injury: Teeth and Oropharynx as per pre-operative assessment

## 2019-11-21 NOTE — Interval H&P Note (Signed)
History and Physical Interval Note:  11/21/2019 11:42 AM  Chad [name]  has presented today for surgery, with the diagnosis of Right urothelial cancer.  The various methods of treatment have been discussed with the patient and family. After consideration of risks, benefits and other options for treatment, the patient has consented to  Procedure(s): CYSTOSCOPY WITH STENT Exchange (Right) as a surgical intervention.  The patient's history has been reviewed, patient examined, no change in status, stable for surgery.  I have reviewed the patient's chart and labs.  Questions were answered to the patient's satisfaction.    RRR CTAB  Received 3 days IV ceftriaxone preop based on culture  Hollice Espy

## 2019-11-21 NOTE — OR Nursing (Signed)
Plavix and aspirin may be restarted today per Dr Erlene Quan.  Patient discharge per order, Lorie Apley rn spoke with Caprice Renshaw and gave report.  Unable to reach anyone at office to schedule f/u in 11 months Caprice Renshaw aware that will need to be scheduled.  Patient discharge to [facility] per people's transportation.

## 2019-11-21 NOTE — Discharge Instructions (Signed)
AMBULATORY SURGERY  DISCHARGE INSTRUCTIONS   1) The drugs that you were given will stay in your system until tomorrow so for the next 24 hours you should not:  A) Drive an automobile B) Make any legal decisions C) Drink any alcoholic beverage   2) You may resume regular meals tomorrow.  Today it is better to start with liquids and gradually work up to solid foods.  You may eat anything you prefer, but it is better to start with liquids, then soup and crackers, and gradually work up to solid foods.   3) Please notify your doctor immediately if you have any unusual bleeding, trouble breathing, redness and pain at the surgery site, drainage, fever, or pain not relieved by medication.    4) Additional Instructions:        Please contact your physician with any problems or Same Day Surgery at [phone], Monday through Friday 6 am to 4 pm, or [place] at [facility] number at [phone].   You have a ureteral stent in place.  This is a tube that extends from your kidney to your bladder.  This may cause urinary bleeding, burning with urination, and urinary frequency.  Please call our office or present to the ED if you develop fevers >101 or pain which is not able to be controlled with oral pain medications.   [facility] [address] [place], [place] [address] [phone]

## 2019-11-21 NOTE — Anesthesia Postprocedure Evaluation (Signed)
Anesthesia Post Note  Patient: Chad [name]  Procedure(s) Performed: CYSTOSCOPY WITH STENT Exchange (Right )  Patient location during evaluation: PACU Anesthesia Type: General Level of consciousness: awake and alert and oriented Pain management: pain level controlled Vital Signs Assessment: post-procedure vital signs reviewed and stable Respiratory status: spontaneous breathing Cardiovascular status: blood pressure returned to baseline Anesthetic complications: no     Last Vitals:  Vitals:   11/21/19 1319 11/21/19 1335  BP: 137/84 118/85  Pulse: 74 95  Resp: 16 16  Temp: 36.6 C 36.7 C  SpO2: 98% 97%    Last Pain:  Vitals:   11/21/19 1335  TempSrc: Temporal  PainSc: 0-No pain                 Raybon Conard

## 2019-11-21 NOTE — Anesthesia Preprocedure Evaluation (Signed)
Anesthesia Evaluation  Patient identified by MRN, date of birth, ID band Patient awake    Reviewed: Allergy & Precautions, H&P , NPO status , Patient's Chart, lab work & pertinent test results, reviewed documented beta blocker date and time   Airway Mallampati: II  TM Distance: >3 FB Neck ROM: full    Dental  (+) Teeth Intact   Pulmonary COPD, Current Smoker,    Pulmonary exam normal        Cardiovascular Exercise Tolerance: Good + Peripheral Vascular Disease  Normal cardiovascular exam Rate:Normal     Neuro/Psych PSYCHIATRIC DISORDERS Anxiety Depression CVA    GI/Hepatic GERD  Medicated,(+) Hepatitis -, C  Endo/Other  negative endocrine ROS  Renal/GU Renal disease  negative genitourinary   Musculoskeletal   Abdominal   Peds  Hematology  (+) Blood dyscrasia, anemia ,   Anesthesia Other Findings   Reproductive/Obstetrics negative OB ROS                             Anesthesia Physical  Anesthesia Plan  ASA: III  Anesthesia Plan: General   Post-op Pain Management:    Induction: Intravenous  PONV Risk Score and Plan:   Airway Management Planned: LMA and Oral ETT  Additional Equipment:   Intra-op Plan:   Post-operative Plan: Extubation in OR  Informed Consent: I have reviewed the patients History and Physical, chart, labs and discussed the procedure including the risks, benefits and alternatives for the proposed anesthesia with the patient or authorized representative who has indicated his/her understanding and acceptance.       Plan Discussed with: CRNA  Anesthesia Plan Comments:         Anesthesia Quick Evaluation

## 2019-11-21 NOTE — Transfer of Care (Signed)
Immediate Anesthesia Transfer of Care Note  Patient: Chad [name]  Procedure(s) Performed: CYSTOSCOPY WITH STENT Exchange (Right )  Patient Location: PACU  Anesthesia Type:General  Level of Consciousness: awake, drowsy and patient cooperative  Airway & Oxygen Therapy: Patient Spontanous Breathing and Patient connected to face mask oxygen  Post-op Assessment: Report given to RN and Post -op Vital signs reviewed and stable  Post vital signs: Reviewed and stable  Last Vitals:  Vitals Value Taken Time  BP 127/80 11/21/19 1245  Temp 37 C 11/21/19 1245  Pulse 89 11/21/19 1248  Resp 15 11/21/19 1248  SpO2 98 % 11/21/19 1248  Vitals shown include unvalidated device data.  Last Pain:  Vitals:   11/21/19 1245  TempSrc:   PainSc: Asleep         Complications: No apparent anesthesia complications

## 2019-11-21 NOTE — OR Nursing (Signed)
Pt. C/o pain to left knee. Knee secured with 2 pillows.  Pt. Stated pillows improved pain. Pt. Asked for "oxycodone". He was advised after anesthesia seen him they would advised if he needed it. He acknowledged and verbalized an understanding.

## 2019-11-21 NOTE — Op Note (Signed)
11/21/19  Preoperative diagnosis: 1. Right upper tract urothelial carcinoma 2. Right hydronephrosis  Postoperative diagnosis: 1. Same as above  Procedure: 1. Cystoscopy 2. Right ureteral stent exchange  Surgeon: Hollice Espy, MD  Anesthesia: General  Complications: None  Intraoperative findings: Stent exchange without difficulty. Minimal to no encrustation.  [id number]  Specimens:None  Drains:6 x 26 French double-J ureteral stent on right, Bard optima  Indication:Chad Lindgrenis a59 y.o.patient withpresumed metastatic urothelial carcinoma with obstructing tumor involving the right UPJ. After reviewing the management options for treatment, he elected to proceed with the above surgical procedure(s). We have discussed the potential benefits and risks of the procedure, side effects of the proposed treatment, the likelihood of the patient achieving the goals of the procedure, and any potential problems that might occur during the procedure or recuperation. Informed consent has been obtained.  Description of procedure:  The patient was taken to the operating room and general anesthesia was induced. The patient was placed in the dorsal lithotomy position, prepped and draped in the usual sterile fashion, and preoperative antibiotics were administered. A preoperative time-out was performed.   21 [place] scope was advanced per urethra into the bladder. The bladder itself was carefully inspected and noted to be relatively normal without any evidence of tumor inflammation or infection. The right UO was visualized with a stent emanating with a full coil which had minimal to no encrustation. The distal coil the stent was grasped and brought to level the urethral meatus. The stent was then cannulated using a sensor wire up to level of the kidney. The wire was then backloaded over the rigid cystoscope. A 6 x 26 French Bard optima ureteral stent was advanced over  the wire up to level the kidney. The wires partially drawn till focal is noted within the renal pelvis. A full code was then noted within the bladder upon final removal. The bladder was drained. The patient was clean and dry, repositioned supine position, reversed anesthesia, taken the PACU in stable condition.  Plan: Patient return in 11 months to discuss his next stent exchange (will extend interval to 12 months given minimal to no encrustation)  Hollice Espy, M.D.

## 2019-11-24 ENCOUNTER — Encounter: Payer: Self-pay | Admitting: Internal Medicine

## 2019-11-24 ENCOUNTER — Other Ambulatory Visit: Payer: Self-pay

## 2019-11-24 ENCOUNTER — Inpatient Hospital Stay: Payer: Medicaid Other

## 2019-11-24 ENCOUNTER — Inpatient Hospital Stay (HOSPITAL_BASED_OUTPATIENT_CLINIC_OR_DEPARTMENT_OTHER): Payer: Medicaid Other | Admitting: Internal Medicine

## 2019-11-24 VITALS — BP 127/85 | HR 81 | Resp 20

## 2019-11-24 DIAGNOSIS — Z5112 Encounter for antineoplastic immunotherapy: Secondary | ICD-10-CM | POA: Diagnosis not present

## 2019-11-24 DIAGNOSIS — Z515 Encounter for palliative care: Secondary | ICD-10-CM

## 2019-11-24 DIAGNOSIS — C661 Malignant neoplasm of right ureter: Secondary | ICD-10-CM

## 2019-11-24 DIAGNOSIS — Z7189 Other specified counseling: Secondary | ICD-10-CM

## 2019-11-24 DIAGNOSIS — D500 Iron deficiency anemia secondary to blood loss (chronic): Secondary | ICD-10-CM

## 2019-11-24 LAB — CBC WITH DIFFERENTIAL/PLATELET
Abs Immature Granulocytes: 0.04 10*3/uL (ref 0.00–0.07)
Basophils Absolute: 0.1 10*3/uL (ref 0.0–0.1)
Basophils Relative: 1 %
Eosinophils Absolute: 0.5 10*3/uL (ref 0.0–0.5)
Eosinophils Relative: 5 %
HCT: 41 % (ref 39.0–52.0)
Hemoglobin: 12.9 g/dL — ABNORMAL LOW (ref 13.0–17.0)
Immature Granulocytes: 0 %
Lymphocytes Relative: 28 %
Lymphs Abs: 2.7 10*3/uL (ref 0.7–4.0)
MCH: 24.1 pg — ABNORMAL LOW (ref 26.0–34.0)
MCHC: 31.5 g/dL (ref 30.0–36.0)
MCV: 76.5 fL — ABNORMAL LOW (ref 80.0–100.0)
Monocytes Absolute: 0.7 10*3/uL (ref 0.1–1.0)
Monocytes Relative: 7 %
Neutro Abs: 5.8 10*3/uL (ref 1.7–7.7)
Neutrophils Relative %: 59 %
Platelets: 232 10*3/uL (ref 150–400)
RBC: 5.36 MIL/uL (ref 4.22–5.81)
RDW: 19.9 % — ABNORMAL HIGH (ref 11.5–15.5)
WBC: 9.9 10*3/uL (ref 4.0–10.5)
nRBC: 0 % (ref 0.0–0.2)

## 2019-11-24 LAB — COMPREHENSIVE METABOLIC PANEL
ALT: 23 U/L (ref 0–44)
AST: 28 U/L (ref 15–41)
Albumin: 3.7 g/dL (ref 3.5–5.0)
Alkaline Phosphatase: 101 U/L (ref 38–126)
Anion gap: 8 (ref 5–15)
BUN: 23 mg/dL (ref 8–23)
CO2: 25 mmol/L (ref 22–32)
Calcium: 8.6 mg/dL — ABNORMAL LOW (ref 8.9–10.3)
Chloride: 105 mmol/L (ref 98–111)
Creatinine, Ser: 1.04 mg/dL (ref 0.61–1.24)
GFR calc Af Amer: >60 mL/min (ref >60)
GFR calc non Af Amer: >60 mL/min (ref >60)
Glucose, Bld: 113 mg/dL — ABNORMAL HIGH (ref 70–99)
Potassium: 3.8 mmol/L (ref 3.5–5.1)
Sodium: 138 mmol/L (ref 135–145)
Total Bilirubin: 0.4 mg/dL (ref 0.3–1.2)
Total Protein: 7.6 g/dL (ref 6.5–8.1)

## 2019-11-24 MED ORDER — IRON SUCROSE 20 MG/ML IV SOLN
200.0000 mg | Freq: Once | INTRAVENOUS | Status: AC
  Administered 2019-11-24: 200 mg via INTRAVENOUS
  Filled 2019-11-24: qty 10

## 2019-11-24 MED ORDER — SODIUM CHLORIDE 0.9 % IV SOLN
200.0000 mg | Freq: Once | INTRAVENOUS | Status: AC
  Administered 2019-11-24: 200 mg via INTRAVENOUS
  Filled 2019-11-24: qty 8

## 2019-11-24 MED ORDER — HEPARIN SOD (PORK) LOCK FLUSH 100 UNIT/ML IV SOLN
500.0000 [IU] | Freq: Once | INTRAVENOUS | Status: AC
  Administered 2019-11-24: 500 [IU] via INTRAVENOUS
  Filled 2019-11-24: qty 5

## 2019-11-24 MED ORDER — SODIUM CHLORIDE 0.9 % IV SOLN
Freq: Once | INTRAVENOUS | Status: AC
  Filled 2019-11-24: qty 250

## 2019-11-24 MED ORDER — SODIUM CHLORIDE 0.9% FLUSH
10.0000 mL | Freq: Once | INTRAVENOUS | Status: AC
  Administered 2019-11-24: 10 mL via INTRAVENOUS
  Filled 2019-11-24: qty 10

## 2019-11-24 MED ORDER — HEPARIN SOD (PORK) LOCK FLUSH 100 UNIT/ML IV SOLN
INTRAVENOUS | Status: AC
  Filled 2019-11-24: qty 5

## 2019-11-24 NOTE — Assessment & Plan Note (Addendum)
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 JAN 2021- 17th 2020-C/A/P- Stable; slight ureteral thickening/small pelvic/retroperitoneal lymph nodes.  No lesions in the liver.   # Proceed with [place] today; Labs today reviewed;  acceptable for treatment today. Will order Scan today.   # Iron deficiency anemia-hemoglobin 12-improved. Continue venofer.   # Bilateral LE ulcers-s/p wound care evaluation; s/p PTCA with Dr.Dew. bil Arterial Dopp-wnl. STABLE.   #DISPOSITION: # Chad [name] today; and venofer today # follow up in 3 weeks -MD labs-cbc/cmp; ;Keytruda/ venofer;CT Chest/Abomen/pelvis prior- Dr.B

## 2019-11-24 NOTE — Progress Notes (Signed)
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; # Colon ca- stage III  GOALS: palliative  CURRENT/MOST RECENT THERAPY: Keytruda [C]    Urothelial cancer ([place])   Initial Diagnosis   Urothelial cancer ([place])   Ureteral cancer, right ([place])  05/26/2018 Initial Diagnosis   Ureteral cancer, right ([place])   06/21/2018 -  Chemotherapy   The patient had pembrolizumab (KEYTRUDA) 200 mg in sodium chloride 0.9 % 50 mL chemo infusion, 200 mg, Intravenous, Once, 19 of 22 cycles Administration: 200 mg (06/21/2018), 200 mg (07/12/2018), 200 mg (08/02/2018), 200 mg (08/23/2018), 200 mg (09/13/2018), 200 mg (10/04/2018), 200 mg (10/25/2018), 200 mg (11/22/2018), 200 mg (12/13/2018), 200 mg (01/05/2019), 200 mg (01/24/2019), 200 mg (02/14/2019), 200 mg (03/11/2019), 200 mg (06/17/2019), 200 mg (08/26/2019), 200 mg (09/16/2019), 200 mg (10/07/2019), 200 mg (10/28/2019), 200 mg (11/24/2019)  for chemotherapy treatment.       HISTORY OF PRESENTING ILLNESS: Patient is a poor historian.  Is alone.  Chad [name] 59 y.o.  male above history of stage IV-ureteral cancer/with multiple co-morbidities currently on Keytruda is here for follow-up.  Patient denies any nausea vomiting.  No headaches.  Chronic shortness of breath and chronic cough.  Not any worse.  Denies any diarrhea.  Review of Systems  Constitutional: Positive for malaise/fatigue. Negative for chills, diaphoresis,  fever and weight loss.  HENT: Negative for nosebleeds and sore throat.   Eyes: Negative for double vision.  Respiratory: Positive for cough and shortness of breath. Negative for hemoptysis and wheezing.   Cardiovascular: Negative for chest pain, palpitations and orthopnea.  Gastrointestinal: Negative for abdominal pain, blood in stool, constipation, diarrhea, heartburn, melena, nausea and vomiting.  Genitourinary: Negative for dysuria, frequency and urgency.  Musculoskeletal: Positive for back pain and joint pain.  Skin: Negative.  Negative for itching and rash.  Neurological: Positive for focal weakness. Negative for dizziness, tingling, weakness and headaches.       Chronic left-sided weakness upper than lower extremity.  Endo/Heme/Allergies: Does not bruise/bleed easily.  Psychiatric/Behavioral: Negative for depression. The patient is not nervous/anxious and does not have insomnia.      MEDICAL HISTORY:  Past Medical History:  Diagnosis Date  . Anemia   . Anxiety   . ARF (acute respiratory failure) ([place])   . Bladder cancer ([place])   . Cancer [facility])    prostate  . COPD (chronic obstructive pulmonary disease) ([place])   . Depression   . Dysphagia   . Family history of colon cancer   . Family history of kidney cancer   . Family history of leukemia   . Family history of prostate cancer   . GERD (gastroesophageal reflux disease)   . Hepatitis    chronic hep c  . Hydronephrosis   . Hydronephrosis with ureteral stricture   . Hyperlipidemia   . Knee pain    Left  . Malignant neoplasm of colon ([place])   . Nerve pain   . Peripheral vascular  disease ([place])   . Stroke ([place])   . Urinary frequency   . Venous hypertension of both lower extremities     SURGICAL HISTORY: Past Surgical History:  Procedure Laterality Date  . COLON SURGERY     En bloc extended right hemicolectomy 07/2017  . CYSTOSCOPY W/ RETROGRADES Right 08/30/2018   Procedure: CYSTOSCOPY WITH RETROGRADE PYELOGRAM;  Surgeon:  Hollice Espy, MD;  Location: [facility] ORS;  Service: Urology;  Laterality: Right;  . CYSTOSCOPY WITH STENT PLACEMENT Right 04/25/2018   Procedure: CYSTOSCOPY WITH STENT PLACEMENT;  Surgeon: Hollice Espy, MD;  Location: [facility] OR[facility];  Service: Urology;  Laterality: Right;  . CYSTOSCOPY WITH STENT PLACEMENT Right 08/30/2018   Procedure: [place] WITH STENT Exchange;  Surgeon: Hollice Espy, MD;  Location: [facility] OR[facility];  Service: Urology;  Laterality: Right;  . CYSTOSCOPY WITH STENT PLACEMENT Right 03/07/2019   Procedure: CYSTOSCOPY WITH STENT Exchange;  Surgeon: Hollice Espy, MD;  Location: [facility] OR[facility];  Service: Urology;  Laterality: Right;  . CYSTOSCOPY WITH STENT PLACEMENT Right 11/21/2019   Procedure: CYSTOSCOPY WITH STENT Exchange;  Surgeon: Hollice Espy, MD;  Location: [facility];  Service: Urology;  Laterality: Right;  . LOWER EXTREMITY ANGIOGRAPHY Left 05/23/2019   Procedure: LOWER EXTREMITY ANGIOGRAPHY;  Surgeon: Algernon Huxley, MD;  Location: [facility];  Service: Cardiovascular;  Laterality: Left;  . LOWER EXTREMITY ANGIOGRAPHY Right 05/30/2019   Procedure: LOWER EXTREMITY ANGIOGRAPHY;  Surgeon: Algernon Huxley, MD;  Location: [facility];  Service: Cardiovascular;  Laterality: Right;  . PORTA CATH INSERTION N/A 02/28/2019   Procedure: PORTA CATH INSERTION;  Surgeon: Algernon Huxley, MD;  Location: [facility];  Service: Cardiovascular;  Laterality: N/A;  . tumor removed       SOCIAL HISTORY: Social History   Socioeconomic History  . Marital status: Single    Spouse name: Not on file  . Number of children: Not on file  . Years of education: Not on file  . Highest education level: Not on file  Occupational History  . Not on file  Tobacco Use  . Smoking status: Current Every Day Smoker    Packs/day: 1.00    Types: Cigarettes  . Smokeless tobacco: Never Used  Substance and Sexual Activity  . Alcohol use: Not Currently  . Drug use: Not Currently  . Sexual  activity: Not on file  Other Topics Concern  . Not on file  Social History Narrative    used to live [place]; moved  To [place]- end of April 2019; in Nursing home; 1pp/day; quit alcohol. 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        Family- dad- prostate ca [at 51y]; brother- 63 died of prostate cancer; brother- 80- no cancers [[place]]; sonGerald Stabs [[place]];[name]-32y prostate ca [facility] [place]]; daughter- 30 [[place]]; another daughter 76 [[place]/addict]. will refer genetics counseling. Given MSI- abnormal; highly suspicious of Lynch syndrome.  Patient's son [name] aware of high possible lynch syndrome.   Social Determinants of Health   Financial Resource Strain:   . Difficulty of Paying Living Expenses:   Food Insecurity:   . Worried About Charity fundraiser in the Last Year:   . Arboriculturist in the Last Year:   Transportation Needs:   . Film/video editor (Medical):   Marland Kitchen Lack of Transportation (Non-Medical):   Physical Activity:   . Days of Exercise per Week:   . Minutes of Exercise per Session:   Stress:   . Feeling  of Stress :   Social Connections:   . Frequency of Communication with Friends and Family:   . Frequency of Social Gatherings with Friends and Family:   . Attends Religious Services:   . Active Member of Clubs or Organizations:   . Attends Archivist Meetings:   Marland Kitchen Marital Status:   Intimate Partner Violence:   . Fear of Current or Ex-Partner:   . Emotionally Abused:   Marland Kitchen Physically Abused:   . Sexually Abused:     FAMILY HISTORY: Family History  Problem Relation Age of Onset  . Prostate cancer Father 54  . Cancer Brother 52       unsure type  . Cancer Paternal Uncle        unsure type  . Cancer Maternal Grandmother        unsure type  . Cancer Paternal Grandmother        unsure type  . Kidney cancer Paternal Grandfather   . Cancer Other        unsure types  . Leukemia Son   . Cancer Son        other cancers, possibly colon    ALLERGIES:  is allergic to  penicillins.  MEDICATIONS:  Current Outpatient Medications  Medication Sig Dispense Refill  . acetaminophen (TYLENOL) 325 MG tablet Take 650 mg by mouth 3 (three) times daily.    Marland Kitchen aspirin EC 81 MG tablet Take 1 tablet (81 mg total) by mouth daily. 150 tablet 2  . atorvastatin (LIPITOR) 10 MG tablet Take 1 tablet (10 mg total) by mouth daily. 30 tablet 11  . clopidogrel (PLAVIX) 75 MG tablet Take 1 tablet (75 mg total) by mouth daily. 30 tablet 11  . collagenase (SANTYL) ointment Apply 1 application topically daily. LEFT LOWER LEG    . cyclobenzaprine (FLEXERIL) 10 MG tablet Take 10 mg by mouth at bedtime.    . diclofenac sodium (VOLTAREN) 1 % GEL Apply 2 g topically See admin instructions. Apply 2 g topically 4 times daily. Apply 2 g to left shoulder, apply 2 g to left hip and 2 g to left knee 4 times daily per [facility] instructions    . escitalopram (LEXAPRO) 5 MG tablet Take 5 mg by mouth daily.     Marland Kitchen gabapentin (NEURONTIN) 100 MG capsule Take 100 mg by mouth 3 (three) times daily.    . mirtazapine (REMERON) 7.5 MG tablet Take 7.5 mg by mouth at bedtime.    Marland Kitchen oxycodone (OXY-IR) 5 MG capsule Take 10 mg by mouth every 6 (six) hours as needed for pain.     . pantoprazole (PROTONIX) 40 MG tablet Take 40 mg by mouth daily.    . cefTRIAXone (ROCEPHIN) 1 g injection Inject 1 g into the muscle daily. for 3 days beginning 11/18/2019. (Patient not taking: Reported on 11/24/2019) 3 each 0   No current facility-administered medications for this visit.      Marland Kitchen  PHYSICAL EXAMINATION: ECOG PERFORMANCE STATUS: 1 - Symptomatic but completely ambulatory  Vitals:   11/24/19 0909  BP: 118/69  Pulse: 79  Temp: (!) 96.3 F (35.7 C)   Filed Weights   11/24/19 0909  Weight: 169 lb (76.7 kg)    Physical Exam  Constitutional: He is oriented to person, place, and time.  In wheelchair. Cachectic.  Appears older than his stated age.  Alone.  HENT:  Head: Normocephalic and atraumatic.  Mouth/Throat:  Oropharynx is clear and moist. No oropharyngeal exudate.  Eyes: Pupils are  equal, round, and reactive to light.  Cardiovascular: Normal rate and regular rhythm.  Pulmonary/Chest: No respiratory distress. He has no wheezes.  Abdominal: Soft. Bowel sounds are normal. He exhibits no distension and no mass. There is no abdominal tenderness. There is no rebound and no guarding.  Musculoskeletal:        General: No tenderness or edema. Normal range of motion.     Cervical back: Normal range of motion and neck supple.  Neurological: He is oriented to person, place, and time.  Sleepy but easily arousable.  Chronic weakness left upper and lower extremity.  Skin: Skin is warm.  Bilateral lower extremity ulcerations noted.  Pulses intact bilaterally.  Psychiatric: Affect normal.     LABORATORY DATA:  I have reviewed the data as listed Lab Results  Component Value Date   WBC 9.9 11/24/2019   HGB 12.9 (L) 11/24/2019   HCT 41.0 11/24/2019   MCV 76.5 (L) 11/24/2019   PLT 232 11/24/2019   Recent Labs    10/07/19 0858 10/28/19 0855 11/24/19 0853  NA 136 135 138  K 4.3 4.2 3.8  CL 105 104 105  CO2 _0 GLUCOSE 97 97 113*  BUN 26* 20 23  CREATININE 0.92 1.07 1.04  CALCIUM 8.7* 8.7* 8.6*  GFRNONAA >60 >60 >60  GFRAA >60 >60 >60  PROT 8.3* 7.9 7.6  ALBUMIN 3.9 3.7 3.7  AST 32 27 28  ALT _1 ALKPHOS 111 105 101  BILITOT 0.7 0.4 0.4    RADIOGRAPHIC STUDIES: I have personally reviewed the radiological images as listed and agreed with the findings in the report. DG OR UROLOGY CYSTO IMAGE ([facility] ONLY)  Result Date: 11/21/2019 There is no interpretation for this exam.  This order is for images obtained during a surgical procedure.  Please See "Surgeries" Tab for more information regarding the procedure.    ASSESSMENT & PLAN:   Ureteral cancer, right ([place]) # High-grade urothelial cancer/cytology; likely of the right renal pelvis /upper ureter. JAN 2021- 17th 2020-C/A/P-  Stable; slight ureteral thickening/small pelvic/retroperitoneal lymph nodes.  No lesions in the liver.   # Proceed with [place] today; Labs today reviewed;  acceptable for treatment today. Will order Scan today.   # Iron deficiency anemia-hemoglobin 12-improved. Continue venofer.   # Bilateral LE ulcers-s/p wound care evaluation; s/p PTCA with Dr.Dew. bil Arterial Dopp-wnl. STABLE.   #DISPOSITION: # [name] today; and venofer today # follow up in 3 weeks -MD labs-cbc/cmp; ;Keytruda/ venofer;CT Chest/Abomen/pelvis prior- Dr.B     All questions were answered. The patient knows to call the clinic with any problems, questions or concerns.    Cammie Sickle, MD 11/27/2019 4:41 PM

## 2019-11-25 ENCOUNTER — Non-Acute Institutional Stay: Payer: Medicaid Other | Admitting: Nurse Practitioner

## 2019-11-25 ENCOUNTER — Encounter: Payer: Self-pay | Admitting: Nurse Practitioner

## 2019-11-25 VITALS — HR 78 | Resp 18

## 2019-11-25 DIAGNOSIS — C661 Malignant neoplasm of right ureter: Secondary | ICD-10-CM

## 2019-11-25 DIAGNOSIS — Z515 Encounter for palliative care: Secondary | ICD-10-CM

## 2019-11-25 NOTE — Progress Notes (Signed)
[facility] Consult Note Telephone: [phone]  Fax: [phone]  PATIENT NAME: Chad [name] DOB: 1960-12-05 MRN: [id number] PROVIDER:Dr Hodges/[place] and PLAN: 1.ACP: DNR; treat what is treatable including hospitalizations if necessary  2.Anorexia secondary to protein calorie malnutritionrelated to cancerimprovingongoing.Continue to monitor daily weights, supplements, supportive measures and courage to eat  3.Chronic pain monitor on pain scale, monitor efficacy vs adverse side effects. Currently takingscheduled gabapentin; tylenol in addition to oxycodone 10mg  q6hrs/not at maximum usage; will continue to monitor; encouraged to ask when in pain, Continue to follow by psychotherapy and psychiatry for counseling; supportive services.  4.Palliative care encounter;Palliative medicine team will continue to support patient, patient's family, and medical team. Visit consisted of counseling and education dealing with the complex and emotionally intense issues of symptom management and palliative care in the setting of serious and potentially life-threatening illness  I spent 60 minutes providing this consultation,  Beginning 1:00pm. More than 50% of the time in this consultation was spent coordinating communication.   HISTORY OF PRESENT ILLNESS:  Chad [name] is a 59 y.o. year old male with multiple medical problems including lateonset CVA, urothelial cancer, prostate cancer, chronic hepatitis C, hyperlipidemia, colon surgery, protein calorie malnutrition, anxiety, depression.  Mr. Heinke continues to reside in [place] at [facility]. Mr Gerold does transfer himself to the wheelchair, is mobile as he wishes at the facility. Mr. Lafitte does perform adl's with some assistance, toilets himself. Mr. Cuthbertson does feed  himself with an improved appetite. Current weight 153.5. Mr. Baumhardt is followed by Psychiatry with last date of service 3/25 / 2021 for depression for which he takes Remeron and Escitalopram. Mr. Wesby is on Tylenol, Voltaren gel, Oxycodone 10 mg 6 hours for pain he is also on Tylenol as needed. He takes schedule Gabapentin. He takes Flexeril for muscle spasms. No recent Falls, infections. At present Mr. Pitta is sitting in the wheelchair. Mr Lochridge appears comfortable, denies any concerns or complaints. I visited and observed Mr. Shiver. We talked about purpose of palliative care visit, Mr. Frickey in agreement. We talked about how he was feeling today. Mr Chea endorse is that he is doing better. He talked about going to the cancer center.   Oncology History Overview Note   # SEP-OCT 2019-right renal pelvis/ ureteral [cytology positive HIGH grade urothelial carcinoma [Dr.Brandon]   # NOV 24th 2019-Keytruda [consent]  #Right ureteral obstruction status post stent placement  # JAN 2019- Right Colon ca [ T4N1]  [[facility]]; NO adjuvant therapy  # Hep C/ # stroke of left side/weakness-2018 Nov [[place]]; active smoker  DIAGNOSIS: # 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; # Colon ca- stage III  GOALS: palliative  CURRENT/MOST RECENT THERAPY: Keytruda [C]   Oncology Plan: from visit 10/28/2019 Dr [name] Ureteral cancer, right [facility]) # High-grade urothelial cancer/cytology; likely of the right renal pelvis /upper ureter. JAN 2021- 17th 2020-C/A/P- Stable; slight ureteral thickening/small pelvic/retroperitoneal lymph nodes.  No lesions in the liver.   # Proceed with [place] today; Labs today reviewed;  acceptable for treatment today. Will order Scan at next visit.   # Iron deficiency anemia-hemoglobin 11.9-improved. Continue venofer.   # Bilateral LE ulcers-s/p wound care evaluation; s/p PTCA with Dr.Dew. bil Arterial Dopp-wnl. STABLE.   #Recent UTI-status post antibiotics symptoms  improved.   #DISPOSITION: # [name] today; and venofer today # follow up in 3 weeks -MD labs-cbc/cmp; ;Keytruda/ venofer;  Mr Sy endorses that he is getting everything taken care of now that things have opened back up from covid. We talked about his appetite. Mr Gress endorses that his appetite has definitely improved. He is gaining weight and feels much better. We talked about residing at skilled facility challenges with loss of [place]. We talked about medical goals of care. DNR in place. We talked about role of palliative care and plan of care. We talked about follow-up visit in two months if needed her sooner should he declined. Mr. Dykhuizen in agreement. Therapeutic listening and emotional support provided. Questions answered to satisfaction. I have attempted to contact his son for update on palliative care visit. The new changes to current goals or plan of care.  Palliative Care was asked to help to continue to address goals of care.   CODE STATUS: DNR  PPS: 50% HOSPICE ELIGIBILITY/DIAGNOSIS: TBD  PAST MEDICAL HISTORY:  Past Medical History:  Diagnosis Date  . Anemia   . Anxiety   . ARF (acute respiratory failure) ([place])   . Bladder cancer ([place])   . Cancer [facility])    prostate  . COPD (chronic obstructive pulmonary disease) ([place])   . Depression   . Dysphagia   . Family history of colon cancer   . Family history of kidney cancer   . Family history of leukemia   . Family history of prostate cancer   . GERD (gastroesophageal reflux disease)   . Hepatitis    chronic hep c  . Hydronephrosis   . Hydronephrosis with ureteral stricture   . Hyperlipidemia   . Knee pain    Left  . Malignant neoplasm of colon ([place])   . Nerve pain   . Peripheral vascular disease ([place])   . Stroke ([place])   . Urinary frequency   . Venous hypertension of both lower extremities     SOCIAL HX:  Social History   Tobacco Use  . Smoking status: Current Every Day Smoker    Packs/day: 1.00     Types: Cigarettes  . Smokeless tobacco: Never Used  Substance Use Topics  . Alcohol use: Not Currently    ALLERGIES:  Allergies  Allergen Reactions  . Penicillins Rash     PERTINENT MEDICATIONS:  Outpatient Encounter Medications as of 11/25/2019  Medication Sig  . acetaminophen (TYLENOL) 325 MG tablet Take 650 mg by mouth 3 (three) times daily.  Marland Kitchen aspirin EC 81 MG tablet Take 1 tablet (81 mg total) by mouth daily.  Marland Kitchen atorvastatin (LIPITOR) 10 MG tablet Take 1 tablet (10 mg total) by mouth daily.  . cefTRIAXone (ROCEPHIN) 1 g injection Inject 1 g into the muscle daily. for 3 days beginning 11/18/2019. (Patient not taking: Reported on 11/24/2019)  . clopidogrel (PLAVIX) 75 MG tablet Take 1 tablet (75 mg total) by mouth daily.  . collagenase (SANTYL) ointment Apply 1 application topically daily. LEFT LOWER LEG  . cyclobenzaprine (FLEXERIL) 10 MG tablet Take 10 mg by mouth at bedtime.  . diclofenac sodium (VOLTAREN) 1 % GEL Apply 2 g topically See admin instructions. Apply 2 g topically 4 times daily. Apply 2 g to left shoulder, apply 2 g to left hip and 2 g to left knee 4 times daily per [facility] instructions  . escitalopram (LEXAPRO) 5 MG tablet Take 5 mg by mouth daily.   Marland Kitchen gabapentin (NEURONTIN) 100 MG capsule Take 100 mg by mouth 3 (three) times daily.  . mirtazapine (REMERON) 7.5 MG tablet Take 7.5 mg by mouth at  bedtime.  Marland Kitchen oxycodone (OXY-IR) 5 MG capsule Take 10 mg by mouth every 6 (six) hours as needed for pain.   . pantoprazole (PROTONIX) 40 MG tablet Take 40 mg by mouth daily.   No facility-administered encounter medications on file as of 11/25/2019.    PHYSICAL EXAM:   General: NAD, debilitated, pleasant male Cardiovascular: regular rate and rhythm Pulmonary: clear ant fields Abdomen: soft, nontender, + bowel sounds Neurological: w/c dependent  Sanaiyah Kirchhoff Ihor Gully, NP

## 2019-12-09 ENCOUNTER — Ambulatory Visit
Admission: RE | Admit: 2019-12-09 | Discharge: 2019-12-09 | Disposition: A | Discharge status: Home / Self Care | Payer: Medicaid Other | Source: Ambulatory Visit | Attending: Internal Medicine | Admitting: Internal Medicine

## 2019-12-09 ENCOUNTER — Other Ambulatory Visit: Payer: Self-pay

## 2019-12-09 DIAGNOSIS — C661 Malignant neoplasm of right ureter: Secondary | ICD-10-CM | POA: Diagnosis not present

## 2019-12-09 MED ORDER — IOHEXOL 300 MG/ML  SOLN
100.0000 mL | Freq: Once | INTRAMUSCULAR | Status: AC | PRN
  Administered 2019-12-09: 100 mL via INTRAVENOUS

## 2019-12-15 ENCOUNTER — Inpatient Hospital Stay (HOSPITAL_BASED_OUTPATIENT_CLINIC_OR_DEPARTMENT_OTHER): Payer: Medicaid Other | Admitting: Internal Medicine

## 2019-12-15 ENCOUNTER — Inpatient Hospital Stay: Payer: Medicaid Other | Attending: Internal Medicine

## 2019-12-15 ENCOUNTER — Encounter: Payer: Self-pay | Admitting: Internal Medicine

## 2019-12-15 ENCOUNTER — Inpatient Hospital Stay: Payer: Medicaid Other

## 2019-12-15 ENCOUNTER — Other Ambulatory Visit: Payer: Self-pay

## 2019-12-15 VITALS — BP 128/94 | HR 72 | Temp 96.1°F | Resp 18

## 2019-12-15 DIAGNOSIS — F17210 Nicotine dependence, cigarettes, uncomplicated: Secondary | ICD-10-CM | POA: Diagnosis not present

## 2019-12-15 DIAGNOSIS — F418 Other specified anxiety disorders: Secondary | ICD-10-CM | POA: Insufficient documentation

## 2019-12-15 DIAGNOSIS — Z8051 Family history of malignant neoplasm of kidney: Secondary | ICD-10-CM | POA: Diagnosis not present

## 2019-12-15 DIAGNOSIS — Z8673 Personal history of transient ischemic attack (TIA), and cerebral infarction without residual deficits: Secondary | ICD-10-CM | POA: Insufficient documentation

## 2019-12-15 DIAGNOSIS — C661 Malignant neoplasm of right ureter: Secondary | ICD-10-CM | POA: Diagnosis not present

## 2019-12-15 DIAGNOSIS — R5383 Other fatigue: Secondary | ICD-10-CM | POA: Insufficient documentation

## 2019-12-15 DIAGNOSIS — I739 Peripheral vascular disease, unspecified: Secondary | ICD-10-CM | POA: Insufficient documentation

## 2019-12-15 DIAGNOSIS — B182 Chronic viral hepatitis C: Secondary | ICD-10-CM | POA: Insufficient documentation

## 2019-12-15 DIAGNOSIS — D509 Iron deficiency anemia, unspecified: Secondary | ICD-10-CM | POA: Insufficient documentation

## 2019-12-15 DIAGNOSIS — Z7982 Long term (current) use of aspirin: Secondary | ICD-10-CM | POA: Diagnosis not present

## 2019-12-15 DIAGNOSIS — R5381 Other malaise: Secondary | ICD-10-CM | POA: Diagnosis not present

## 2019-12-15 DIAGNOSIS — Z8042 Family history of malignant neoplasm of prostate: Secondary | ICD-10-CM | POA: Diagnosis not present

## 2019-12-15 DIAGNOSIS — Z85038 Personal history of other malignant neoplasm of large intestine: Secondary | ICD-10-CM | POA: Insufficient documentation

## 2019-12-15 DIAGNOSIS — E785 Hyperlipidemia, unspecified: Secondary | ICD-10-CM | POA: Insufficient documentation

## 2019-12-15 DIAGNOSIS — Z9221 Personal history of antineoplastic chemotherapy: Secondary | ICD-10-CM | POA: Insufficient documentation

## 2019-12-15 DIAGNOSIS — Z791 Long term (current) use of non-steroidal anti-inflammatories (NSAID): Secondary | ICD-10-CM | POA: Insufficient documentation

## 2019-12-15 DIAGNOSIS — Z79899 Other long term (current) drug therapy: Secondary | ICD-10-CM | POA: Diagnosis not present

## 2019-12-15 DIAGNOSIS — J449 Chronic obstructive pulmonary disease, unspecified: Secondary | ICD-10-CM | POA: Insufficient documentation

## 2019-12-15 DIAGNOSIS — Z800 Family history of malignant neoplasm of digestive organs: Secondary | ICD-10-CM | POA: Insufficient documentation

## 2019-12-15 DIAGNOSIS — Z7902 Long term (current) use of antithrombotics/antiplatelets: Secondary | ICD-10-CM | POA: Diagnosis not present

## 2019-12-15 DIAGNOSIS — K219 Gastro-esophageal reflux disease without esophagitis: Secondary | ICD-10-CM | POA: Diagnosis not present

## 2019-12-15 DIAGNOSIS — Z5112 Encounter for antineoplastic immunotherapy: Secondary | ICD-10-CM | POA: Insufficient documentation

## 2019-12-15 DIAGNOSIS — Z515 Encounter for palliative care: Secondary | ICD-10-CM

## 2019-12-15 DIAGNOSIS — D500 Iron deficiency anemia secondary to blood loss (chronic): Secondary | ICD-10-CM

## 2019-12-15 DIAGNOSIS — Z7189 Other specified counseling: Secondary | ICD-10-CM

## 2019-12-15 LAB — CBC WITH DIFFERENTIAL/PLATELET
Abs Immature Granulocytes: 0.03 10*3/uL (ref 0.00–0.07)
Basophils Absolute: 0.1 10*3/uL (ref 0.0–0.1)
Basophils Relative: 1 %
Eosinophils Absolute: 0.5 10*3/uL (ref 0.0–0.5)
Eosinophils Relative: 5 %
HCT: 40.5 % (ref 39.0–52.0)
Hemoglobin: 12.7 g/dL — ABNORMAL LOW (ref 13.0–17.0)
Immature Granulocytes: 0 %
Lymphocytes Relative: 18 %
Lymphs Abs: 1.9 10*3/uL (ref 0.7–4.0)
MCH: 24.3 pg — ABNORMAL LOW (ref 26.0–34.0)
MCHC: 31.4 g/dL (ref 30.0–36.0)
MCV: 77.4 fL — ABNORMAL LOW (ref 80.0–100.0)
Monocytes Absolute: 0.8 10*3/uL (ref 0.1–1.0)
Monocytes Relative: 7 %
Neutro Abs: 7.4 10*3/uL (ref 1.7–7.7)
Neutrophils Relative %: 69 %
Platelets: 174 10*3/uL (ref 150–400)
RBC: 5.23 MIL/uL (ref 4.22–5.81)
RDW: 20.4 % — ABNORMAL HIGH (ref 11.5–15.5)
WBC: 10.7 10*3/uL — ABNORMAL HIGH (ref 4.0–10.5)
nRBC: 0 % (ref 0.0–0.2)

## 2019-12-15 LAB — COMPREHENSIVE METABOLIC PANEL
ALT: 25 U/L (ref 0–44)
AST: 30 U/L (ref 15–41)
Albumin: 3.6 g/dL (ref 3.5–5.0)
Alkaline Phosphatase: 128 U/L — ABNORMAL HIGH (ref 38–126)
Anion gap: 9 (ref 5–15)
BUN: 22 mg/dL (ref 8–23)
CO2: 25 mmol/L (ref 22–32)
Calcium: 8.5 mg/dL — ABNORMAL LOW (ref 8.9–10.3)
Chloride: 104 mmol/L (ref 98–111)
Creatinine, Ser: 0.89 mg/dL (ref 0.61–1.24)
GFR calc Af Amer: >60 mL/min (ref >60)
GFR calc non Af Amer: >60 mL/min (ref >60)
Glucose, Bld: 85 mg/dL (ref 70–99)
Potassium: 4.1 mmol/L (ref 3.5–5.1)
Sodium: 138 mmol/L (ref 135–145)
Total Bilirubin: 0.5 mg/dL (ref 0.3–1.2)
Total Protein: 7.3 g/dL (ref 6.5–8.1)

## 2019-12-15 MED ORDER — HEPARIN SOD (PORK) LOCK FLUSH 100 UNIT/ML IV SOLN
500.0000 [IU] | Freq: Once | INTRAVENOUS | Status: AC | PRN
  Administered 2019-12-15: 500 [IU]
  Filled 2019-12-15: qty 5

## 2019-12-15 MED ORDER — SODIUM CHLORIDE 0.9 % IV SOLN
Freq: Once | INTRAVENOUS | Status: AC
  Filled 2019-12-15: qty 250

## 2019-12-15 MED ORDER — SODIUM CHLORIDE 0.9 % IV SOLN
200.0000 mg | Freq: Once | INTRAVENOUS | Status: AC
  Administered 2019-12-15: 200 mg via INTRAVENOUS
  Filled 2019-12-15: qty 8

## 2019-12-15 MED ORDER — SODIUM CHLORIDE 0.9% FLUSH
10.0000 mL | Freq: Once | INTRAVENOUS | Status: AC
  Administered 2019-12-15: 10 mL via INTRAVENOUS
  Filled 2019-12-15: qty 10

## 2019-12-15 MED ORDER — IRON SUCROSE 20 MG/ML IV SOLN
200.0000 mg | Freq: Once | INTRAVENOUS | Status: AC
  Administered 2019-12-15: 200 mg via INTRAVENOUS
  Filled 2019-12-15: qty 10

## 2019-12-15 NOTE — Progress Notes (Signed)
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; # Colon ca- stage III  GOALS: palliative  CURRENT/MOST RECENT THERAPY: Keytruda [C]    Urothelial cancer ([place])   Initial Diagnosis   Urothelial cancer ([place])   Ureteral cancer, right ([place])  05/26/2018 Initial Diagnosis   Ureteral cancer, right ([place])   06/21/2018 -  Chemotherapy   The patient had pembrolizumab (KEYTRUDA) 200 mg in sodium chloride 0.9 % 50 mL chemo infusion, 200 mg, Intravenous, Once, 19 of 22 cycles Administration: 200 mg (06/21/2018), 200 mg (07/12/2018), 200 mg (08/02/2018), 200 mg (08/23/2018), 200 mg (09/13/2018), 200 mg (10/04/2018), 200 mg (10/25/2018), 200 mg (11/22/2018), 200 mg (12/13/2018), 200 mg (01/05/2019), 200 mg (01/24/2019), 200 mg (02/14/2019), 200 mg (03/11/2019), 200 mg (06/17/2019), 200 mg (08/26/2019), 200 mg (09/16/2019), 200 mg (10/07/2019), 200 mg (10/28/2019), 200 mg (11/24/2019)  for chemotherapy treatment.       HISTORY OF PRESENTING ILLNESS: Patient is a poor historian.  Is alone.  Chad [name] 59 y.o.  male above history of stage IV-ureteral cancer/with multiple co-morbidities currently on Keytruda is here for follow-up.  Patient admits to good appetite.  No weight loss.  In fact weight gain.  No nausea no vomiting.  No diarrhea.  Chronic knee pain not any worse.  Review of Systems  Constitutional: Positive for malaise/fatigue. Negative for chills,  diaphoresis, fever and weight loss.  HENT: Negative for nosebleeds and sore throat.   Eyes: Negative for double vision.  Respiratory: Positive for cough and shortness of breath. Negative for hemoptysis and wheezing.   Cardiovascular: Negative for chest pain, palpitations and orthopnea.  Gastrointestinal: Negative for abdominal pain, blood in stool, constipation, diarrhea, heartburn, melena, nausea and vomiting.  Genitourinary: Negative for dysuria, frequency and urgency.  Musculoskeletal: Positive for back pain and joint pain.  Skin: Negative.  Negative for itching and rash.  Neurological: Positive for focal weakness. Negative for dizziness, tingling, weakness and headaches.       Chronic left-sided weakness upper than lower extremity.  Endo/Heme/Allergies: Does not bruise/bleed easily.  Psychiatric/Behavioral: Negative for depression. The patient is not nervous/anxious and does not have insomnia.      MEDICAL HISTORY:  Past Medical History:  Diagnosis Date  . Anemia   . Anxiety   . ARF (acute respiratory failure) ([place])   . Bladder cancer ([place])   . Cancer [facility])    prostate  . COPD (chronic obstructive pulmonary disease) ([place])   . Depression   . Dysphagia   . Family history of colon cancer   . Family history of kidney cancer   . Family history of leukemia   . Family history of prostate cancer   . GERD (gastroesophageal reflux disease)   . Hepatitis    chronic hep c  . Hydronephrosis   . Hydronephrosis with ureteral stricture   . Hyperlipidemia   . Knee pain    Left  . Malignant neoplasm of colon ([place])   . Nerve pain   .  Peripheral vascular disease ([place])   . Stroke ([place])   . Urinary frequency   . Venous hypertension of both lower extremities     SURGICAL HISTORY: Past Surgical History:  Procedure Laterality Date  . COLON SURGERY     En bloc extended right hemicolectomy 07/2017  . CYSTOSCOPY W/ RETROGRADES Right 08/30/2018   Procedure: CYSTOSCOPY WITH RETROGRADE  PYELOGRAM;  Surgeon: Hollice Espy, MD;  Location: [facility] OR[facility];  Service: Urology;  Laterality: Right;  . CYSTOSCOPY WITH STENT PLACEMENT Right 04/25/2018   Procedure: CYSTOSCOPY WITH STENT PLACEMENT;  Surgeon: Hollice Espy, MD;  Location: [facility];  Service: Urology;  Laterality: Right;  . CYSTOSCOPY WITH STENT PLACEMENT Right 08/30/2018   Procedure: [place] WITH STENT Exchange;  Surgeon: Hollice Espy, MD;  Location: [facility];  Service: Urology;  Laterality: Right;  . CYSTOSCOPY WITH STENT PLACEMENT Right 03/07/2019   Procedure: CYSTOSCOPY WITH STENT Exchange;  Surgeon: Hollice Espy, MD;  Location: [facility];  Service: Urology;  Laterality: Right;  . CYSTOSCOPY WITH STENT PLACEMENT Right 11/21/2019   Procedure: CYSTOSCOPY WITH STENT Exchange;  Surgeon: Hollice Espy, MD;  Location: [facility];  Service: Urology;  Laterality: Right;  . LOWER EXTREMITY ANGIOGRAPHY Left 05/23/2019   Procedure: LOWER EXTREMITY ANGIOGRAPHY;  Surgeon: Algernon Huxley, MD;  Location: [place] [facility];  Service: Cardiovascular;  Laterality: Left;  . LOWER EXTREMITY ANGIOGRAPHY Right 05/30/2019   Procedure: LOWER EXTREMITY ANGIOGRAPHY;  Surgeon: Algernon Huxley, MD;  Location: [place] [facility];  Service: Cardiovascular;  Laterality: Right;  . PORTA CATH INSERTION N/A 02/28/2019   Procedure: PORTA CATH INSERTION;  Surgeon: Algernon Huxley, MD;  Location: [place] [facility];  Service: Cardiovascular;  Laterality: N/A;  . tumor removed       SOCIAL HISTORY: Social History   Socioeconomic History  . Marital status: Single    Spouse name: Not on file  . Number of children: Not on file  . Years of education: Not on file  . Highest education level: Not on file  Occupational History  . Not on file  Tobacco Use  . Smoking status: Current Every Day Smoker    Packs/day: 1.00    Types: Cigarettes  . Smokeless tobacco: Never Used  Substance and Sexual Activity  . Alcohol use: Not Currently  . Drug use: Not  Currently  . Sexual activity: Not on file  Other Topics Concern  . Not on file  Social History Narrative    used to live [place]; moved  To [place]- end of April 2019; in Nursing home; 1pp/day; quit alcohol. 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        Family- dad- prostate ca [at 62y]; brother- 22 died of prostate cancer; brother- 75- no cancers [[place]]; sonGerald Stabs [[place]];[name]-32y prostate ca [facility] [place]]; daughter- 60 [[place]]; another daughter 58 [[place]/addict]. will refer genetics counseling. Given MSI- abnormal; highly suspicious of Lynch syndrome.  Patient's son [name] aware of high possible lynch syndrome.   Social Determinants of Health   Financial Resource Strain:   . Difficulty of Paying Living Expenses:   Food Insecurity:   . Worried About Charity fundraiser in the Last Year:   . Arboriculturist in the Last Year:   Transportation Needs:   . Film/video editor (Medical):   Marland Kitchen Lack of Transportation (Non-Medical):   Physical Activity:   . Days of Exercise per Week:   . Minutes of Exercise per Session:   Stress:   .  Feeling of Stress :   Social Connections:   . Frequency of Communication with Friends and Family:   . Frequency of Social Gatherings with Friends and Family:   . Attends Religious Services:   . Active Member of Clubs or Organizations:   . Attends Archivist Meetings:   Marland Kitchen Marital Status:   Intimate Partner Violence:   . Fear of Current or Ex-Partner:   . Emotionally Abused:   Marland Kitchen Physically Abused:   . Sexually Abused:     FAMILY HISTORY: Family History  Problem Relation Age of Onset  . Prostate cancer Father 55  . Cancer Brother 60       unsure type  . Cancer Paternal Uncle        unsure type  . Cancer Maternal Grandmother        unsure type  . Cancer Paternal Grandmother        unsure type  . Kidney cancer Paternal Grandfather   . Cancer Other        unsure types  . Leukemia Son   . Cancer Son        other cancers, possibly colon     ALLERGIES:  is allergic to penicillins.  MEDICATIONS:  Current Outpatient Medications  Medication Sig Dispense Refill  . acetaminophen (TYLENOL) 325 MG tablet Take 650 mg by mouth 3 (three) times daily.    Marland Kitchen aspirin EC 81 MG tablet Take 1 tablet (81 mg total) by mouth daily. 150 tablet 2  . atorvastatin (LIPITOR) 10 MG tablet Take 1 tablet (10 mg total) by mouth daily. 30 tablet 11  . clopidogrel (PLAVIX) 75 MG tablet Take 1 tablet (75 mg total) by mouth daily. 30 tablet 11  . collagenase (SANTYL) ointment Apply 1 application topically daily. LEFT LOWER LEG    . cyclobenzaprine (FLEXERIL) 10 MG tablet Take 10 mg by mouth at bedtime.    . diclofenac sodium (VOLTAREN) 1 % GEL Apply 2 g topically See admin instructions. Apply 2 g topically 4 times daily. Apply 2 g to left shoulder, apply 2 g to left hip and 2 g to left knee 4 times daily per [facility] instructions    . escitalopram (LEXAPRO) 5 MG tablet Take 5 mg by mouth daily.     Marland Kitchen gabapentin (NEURONTIN) 100 MG capsule Take 100 mg by mouth 3 (three) times daily.    . mirtazapine (REMERON) 7.5 MG tablet Take 7.5 mg by mouth at bedtime.    Marland Kitchen oxycodone (OXY-IR) 5 MG capsule Take 10 mg by mouth every 6 (six) hours as needed for pain.     . pantoprazole (PROTONIX) 40 MG tablet Take 40 mg by mouth daily.    . cefTRIAXone (ROCEPHIN) 1 g injection Inject 1 g into the muscle daily. for 3 days beginning 11/18/2019. (Patient not taking: Reported on 12/15/2019) 3 each 0   No current facility-administered medications for this visit.      Marland Kitchen  PHYSICAL EXAMINATION: ECOG PERFORMANCE STATUS: 1 - Symptomatic but completely ambulatory  Vitals:   12/15/19 0921  BP: 114/84  Pulse: 79  Resp: 20  Temp: (!) 95.9 F (35.5 C)  SpO2: 96%   Filed Weights   12/15/19 0921  Weight: 174 lb 12.8 oz (79.3 kg)    Physical Exam  Constitutional: He is oriented to person, place, and time.  In wheelchair. Cachectic.  Appears older than his stated age.  Alone.   HENT:  Head: Normocephalic and atraumatic.  Mouth/Throat: Oropharynx is clear  and moist. No oropharyngeal exudate.  Eyes: Pupils are equal, round, and reactive to light.  Cardiovascular: Normal rate and regular rhythm.  Pulmonary/Chest: No respiratory distress. He has no wheezes.  Abdominal: Soft. Bowel sounds are normal. He exhibits no distension and no mass. There is no abdominal tenderness. There is no rebound and no guarding.  Musculoskeletal:        General: No tenderness or edema. Normal range of motion.     Cervical back: Normal range of motion and neck supple.  Neurological: He is oriented to person, place, and time.  Sleepy but easily arousable.  Chronic weakness left upper and lower extremity.  Skin: Skin is warm.  Bilateral lower extremity ulcerations noted.  Pulses intact bilaterally.  Psychiatric: Affect normal.     LABORATORY DATA:  I have reviewed the data as listed Lab Results  Component Value Date   WBC 10.7 (H) 12/15/2019   HGB 12.7 (L) 12/15/2019   HCT 40.5 12/15/2019   MCV 77.4 (L) 12/15/2019   PLT 174 12/15/2019   Recent Labs    10/28/19 0855 11/24/19 0853 12/15/19 0901  NA 135 138 138  K 4.2 3.8 4.1  CL 104 105 104  CO2 '23 25 25  ' GLUCOSE 97 113* 85  BUN '20 23 22  ' CREATININE 1.07 1.04 0.89  CALCIUM 8.7* 8.6* 8.5*  GFRNONAA >60 >60 >60  GFRAA >60 >60 >60  PROT 7.9 7.6 7.3  ALBUMIN 3.7 3.7 3.6  AST '27 28 30  ' ALT '22 23 25  ' ALKPHOS 105 101 128*  BILITOT 0.4 0.4 0.5    RADIOGRAPHIC STUDIES: I have personally reviewed the radiological images as listed and agreed with the findings in the report. CT CHEST W CONTRAST  Result Date: 12/09/2019 CLINICAL DATA:  Restaging urothelial carcinoma EXAM: CT CHEST, ABDOMEN, AND PELVIS WITH CONTRAST TECHNIQUE: Multidetector CT imaging of the chest, abdomen and pelvis was performed following the standard protocol during bolus administration of intravenous contrast. CONTRAST:  129m OMNIPAQUE IOHEXOL 300 MG/ML   SOLN COMPARISON:  CT AP 08/24/2019 and CT cap 04/14/2019 FINDINGS: CT CHEST FINDINGS Cardiovascular: Heart size appears within normal limits. No pericardial effusion. Aortic atherosclerosis. Lad and RCA coronary artery calcifications. Mediastinum/Nodes: Normal appearance of the thyroid gland. The trachea appears patent and is midline. Normal appearance of the esophagus. No mediastinal or hilar adenopathy. Morphologically benign right axillary lymph node is again noted with prominent fatty hilum measuring 0.8 cm short axis, unchanged. Lungs/Pleura: No pleural effusion identified. No airspace consolidation, atelectasis, or pneumothorax. Several calcified granulomas identified within the right upper lobe. No suspicious lung lesions. Musculoskeletal: No aggressive lytic or sclerotic bone lesions. CT ABDOMEN PELVIS FINDINGS Hepatobiliary: Caudate and lateral segment left lobe of liver enlargement with subtle irregular hepatic capsule. No focal liver lesions identified. Gallbladder negative. No bile duct dilatation. Pancreas: Unremarkable. No pancreatic ductal dilatation or surrounding inflammatory changes. Spleen: Normal in size without focal abnormality. Adrenals/Urinary Tract: Normal appearance of the adrenal glands. Inferior pole left kidney cyst measures 1.5 cm. Asymmetric cortical volume loss from the right kidney which appears hydronephrotic containing nephroureteral stent. Similar appearance of wall thickening and soft tissue stranding of the right renal pelvis and right UPJ. This measures 3.2 x 2.6 by 3.5 cm. No additional abnormal areas within the upper urinary tract. No suspicious enhancing bladder lesions. Stomach/Bowel: Stomach is within normal limits. No evidence of bowel wall thickening, distention, or inflammatory changes. Vascular/Lymphatic: Aortic atherosclerosis without aneurysm. Upper normal hepatic duodenal ligament lymph nodes are stable. No  retroperitoneal adenopathy. Upper limits of normal  bilateral pelvic sidewall lymph nodes are identified, similar. Index left iliac node measures 0.7 cm, image 103/507. Previously 0.9 cm. Right iliac node measures 0.7 cm, image 99/507. Previously 0.8 cm. Index right inguinal lymph node is unchanged measuring 8 mm, image 111/507. Index left inguinal lymph node measures 1.5 cm, image 131/507. Stable. Reproductive: Prostate gland enlargement. Other: No abdominopelvic ascites. Musculoskeletal: No acute or significant osseous findings. IMPRESSION: 1. Stable appearance of soft tissue stranding and wall thickening of the right renal pelvis and right UPJ. No specific findings identified to suggest metastatic disease within the chest, abdomen or pelvis. 2. Similar appearance of right-sided hydronephrosis containing nephroureteral stent. 3. Morphologic features of liver suggestive of early cirrhosis. 4. Aortic atherosclerosis, in addition to LAD and RCA coronary artery disease. Please note that although the presence of coronary artery calcium documents the presence of coronary artery disease, the severity of this disease and any potential stenosis cannot be assessed on this non-gated CT examination. Assessment for potential risk factor modification, dietary therapy or pharmacologic therapy may be warranted, if clinically indicated. Aortic Atherosclerosis (ICD10-I70.0). Electronically Signed   By: Kerby Moors M.D.   On: 12/09/2019 21:01   CT Abdomen Pelvis W Contrast  Result Date: 12/09/2019 CLINICAL DATA:  Restaging urothelial carcinoma EXAM: CT CHEST, ABDOMEN, AND PELVIS WITH CONTRAST TECHNIQUE: Multidetector CT imaging of the chest, abdomen and pelvis was performed following the standard protocol during bolus administration of intravenous contrast. CONTRAST:  133m OMNIPAQUE IOHEXOL 300 MG/ML  SOLN COMPARISON:  CT AP 08/24/2019 and CT cap 04/14/2019 FINDINGS: CT CHEST FINDINGS Cardiovascular: Heart size appears within normal limits. No pericardial effusion. Aortic  atherosclerosis. Lad and RCA coronary artery calcifications. Mediastinum/Nodes: Normal appearance of the thyroid gland. The trachea appears patent and is midline. Normal appearance of the esophagus. No mediastinal or hilar adenopathy. Morphologically benign right axillary lymph node is again noted with prominent fatty hilum measuring 0.8 cm short axis, unchanged. Lungs/Pleura: No pleural effusion identified. No airspace consolidation, atelectasis, or pneumothorax. Several calcified granulomas identified within the right upper lobe. No suspicious lung lesions. Musculoskeletal: No aggressive lytic or sclerotic bone lesions. CT ABDOMEN PELVIS FINDINGS Hepatobiliary: Caudate and lateral segment left lobe of liver enlargement with subtle irregular hepatic capsule. No focal liver lesions identified. Gallbladder negative. No bile duct dilatation. Pancreas: Unremarkable. No pancreatic ductal dilatation or surrounding inflammatory changes. Spleen: Normal in size without focal abnormality. Adrenals/Urinary Tract: Normal appearance of the adrenal glands. Inferior pole left kidney cyst measures 1.5 cm. Asymmetric cortical volume loss from the right kidney which appears hydronephrotic containing nephroureteral stent. Similar appearance of wall thickening and soft tissue stranding of the right renal pelvis and right UPJ. This measures 3.2 x 2.6 by 3.5 cm. No additional abnormal areas within the upper urinary tract. No suspicious enhancing bladder lesions. Stomach/Bowel: Stomach is within normal limits. No evidence of bowel wall thickening, distention, or inflammatory changes. Vascular/Lymphatic: Aortic atherosclerosis without aneurysm. Upper normal hepatic duodenal ligament lymph nodes are stable. No retroperitoneal adenopathy. Upper limits of normal bilateral pelvic sidewall lymph nodes are identified, similar. Index left iliac node measures 0.7 cm, image 103/507. Previously 0.9 cm. Right iliac node measures 0.7 cm, image  99/507. Previously 0.8 cm. Index right inguinal lymph node is unchanged measuring 8 mm, image 111/507. Index left inguinal lymph node measures 1.5 cm, image 131/507. Stable. Reproductive: Prostate gland enlargement. Other: No abdominopelvic ascites. Musculoskeletal: No acute or significant osseous findings. IMPRESSION: 1. Stable appearance of soft tissue  stranding and wall thickening of the right renal pelvis and right UPJ. No specific findings identified to suggest metastatic disease within the chest, abdomen or pelvis. 2. Similar appearance of right-sided hydronephrosis containing nephroureteral stent. 3. Morphologic features of liver suggestive of early cirrhosis. 4. Aortic atherosclerosis, in addition to LAD and RCA coronary artery disease. Please note that although the presence of coronary artery calcium documents the presence of coronary artery disease, the severity of this disease and any potential stenosis cannot be assessed on this non-gated CT examination. Assessment for potential risk factor modification, dietary therapy or pharmacologic therapy may be warranted, if clinically indicated. Aortic Atherosclerosis (ICD10-I70.0). Electronically Signed   By: Kerby Moors M.D.   On: 12/09/2019 21:01   DG OR UROLOGY CYSTO IMAGE ([place])  Result Date: 11/21/2019 There is no interpretation for this exam.  This order is for images obtained during a surgical procedure.  Please See "Surgeries" Tab for more information regarding the procedure.    ASSESSMENT & PLAN:   Ureteral cancer, right ([place]) # High-grade urothelial cancer/cytology; likely of the right renal pelvis /upper ureter.  May 2021 C/A/P-stable; Slight ureteral thickening/small pelvic/retroperitoneal lymph nodes.  No lesions in the liver.   # Proceed with [place] today; Labs today reviewed;  acceptable for treatment today.  # Iron deficiency anemia-hemoglobin 12-improved. Continue venofer.   # Bilateral LE ulcers-s/p wound care  evaluation; s/p PTCA with Dr.Dew. bil Arterial Dopp-wnl.stable.    #DISPOSITION: # [name] today; and venofer today # follow up in 3 weeks -MD labs-cbc/cmp; ;Keytruda/- Dr.B     All questions were answered. The patient knows to call the clinic with any problems, questions or concerns.    Cammie Sickle, MD 12/15/2019 9:45 AM

## 2019-12-15 NOTE — Assessment & Plan Note (Addendum)
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  May 2021 C/A/P-stable; Slight ureteral thickening/small pelvic/retroperitoneal lymph nodes.  No lesions in the liver.   # Proceed with [place] today; Labs today reviewed;  acceptable for treatment today.  # Iron deficiency anemia-hemoglobin 12-improved. Continue venofer.   # Bilateral LE ulcers-s/p wound care evaluation; s/p PTCA with Dr.Dew. bil Arterial Dopp-wnl.stable.    #DISPOSITION: # [name] today; and venofer today # follow up in 3 weeks -MD labs-cbc/cmp; ;Keytruda/- Dr.B

## 2019-12-15 NOTE — Progress Notes (Signed)
Hemoglobin 12.7, Per Dr. [name] proceed with treatment including Venofer.

## 2019-12-23 ENCOUNTER — Other Ambulatory Visit (INDEPENDENT_AMBULATORY_CARE_PROVIDER_SITE_OTHER): Payer: Self-pay | Admitting: Vascular Surgery

## 2019-12-23 DIAGNOSIS — I739 Peripheral vascular disease, unspecified: Secondary | ICD-10-CM

## 2019-12-27 ENCOUNTER — Encounter (INDEPENDENT_AMBULATORY_CARE_PROVIDER_SITE_OTHER): Payer: Medicaid Other

## 2019-12-27 ENCOUNTER — Ambulatory Visit (INDEPENDENT_AMBULATORY_CARE_PROVIDER_SITE_OTHER): Payer: Medicaid Other | Admitting: Vascular Surgery

## 2019-12-29 ENCOUNTER — Other Ambulatory Visit: Payer: Self-pay | Admitting: *Deleted

## 2019-12-29 DIAGNOSIS — C661 Malignant neoplasm of right ureter: Secondary | ICD-10-CM

## 2019-12-29 NOTE — Progress Notes (Signed)
Pharmacist Chemotherapy Monitoring - Follow Up Assessment    I verify that I have reviewed each item in the below checklist:  . Regimen for the patient is scheduled for the appropriate day and plan matches scheduled date. Marland Kitchen Appropriate non-routine labs are ordered dependent on drug ordered. . If applicable, additional medications reviewed and ordered per protocol based on lifetime cumulative doses and/or treatment regimen.   Plan for follow-up and/or issues identified: Yes . I-vent associated with next due treatment: No . MD and/or nursing notified: Yes - order tsh   Adelina Mings 12/29/2019 9:17 AM

## 2020-01-05 ENCOUNTER — Inpatient Hospital Stay: Payer: Medicaid Other

## 2020-01-05 ENCOUNTER — Encounter: Payer: Self-pay | Admitting: Internal Medicine

## 2020-01-05 ENCOUNTER — Other Ambulatory Visit: Payer: Self-pay

## 2020-01-05 ENCOUNTER — Inpatient Hospital Stay: Payer: Medicaid Other | Attending: Internal Medicine

## 2020-01-05 ENCOUNTER — Inpatient Hospital Stay (HOSPITAL_BASED_OUTPATIENT_CLINIC_OR_DEPARTMENT_OTHER): Payer: Medicaid Other | Admitting: Internal Medicine

## 2020-01-05 DIAGNOSIS — R5383 Other fatigue: Secondary | ICD-10-CM | POA: Insufficient documentation

## 2020-01-05 DIAGNOSIS — R5381 Other malaise: Secondary | ICD-10-CM | POA: Diagnosis not present

## 2020-01-05 DIAGNOSIS — Z9221 Personal history of antineoplastic chemotherapy: Secondary | ICD-10-CM | POA: Diagnosis not present

## 2020-01-05 DIAGNOSIS — Z800 Family history of malignant neoplasm of digestive organs: Secondary | ICD-10-CM | POA: Diagnosis not present

## 2020-01-05 DIAGNOSIS — Z95828 Presence of other vascular implants and grafts: Secondary | ICD-10-CM

## 2020-01-05 DIAGNOSIS — C661 Malignant neoplasm of right ureter: Secondary | ICD-10-CM | POA: Insufficient documentation

## 2020-01-05 DIAGNOSIS — I10 Essential (primary) hypertension: Secondary | ICD-10-CM | POA: Diagnosis not present

## 2020-01-05 DIAGNOSIS — B182 Chronic viral hepatitis C: Secondary | ICD-10-CM | POA: Diagnosis not present

## 2020-01-05 DIAGNOSIS — E785 Hyperlipidemia, unspecified: Secondary | ICD-10-CM | POA: Diagnosis not present

## 2020-01-05 DIAGNOSIS — K219 Gastro-esophageal reflux disease without esophagitis: Secondary | ICD-10-CM | POA: Diagnosis not present

## 2020-01-05 DIAGNOSIS — Z515 Encounter for palliative care: Secondary | ICD-10-CM

## 2020-01-05 DIAGNOSIS — J449 Chronic obstructive pulmonary disease, unspecified: Secondary | ICD-10-CM | POA: Insufficient documentation

## 2020-01-05 DIAGNOSIS — F17210 Nicotine dependence, cigarettes, uncomplicated: Secondary | ICD-10-CM | POA: Diagnosis not present

## 2020-01-05 DIAGNOSIS — Z7982 Long term (current) use of aspirin: Secondary | ICD-10-CM | POA: Diagnosis not present

## 2020-01-05 DIAGNOSIS — I739 Peripheral vascular disease, unspecified: Secondary | ICD-10-CM | POA: Diagnosis not present

## 2020-01-05 DIAGNOSIS — M2550 Pain in unspecified joint: Secondary | ICD-10-CM | POA: Insufficient documentation

## 2020-01-05 DIAGNOSIS — G8929 Other chronic pain: Secondary | ICD-10-CM | POA: Insufficient documentation

## 2020-01-05 DIAGNOSIS — Z791 Long term (current) use of non-steroidal anti-inflammatories (NSAID): Secondary | ICD-10-CM | POA: Diagnosis not present

## 2020-01-05 DIAGNOSIS — Z79899 Other long term (current) drug therapy: Secondary | ICD-10-CM | POA: Insufficient documentation

## 2020-01-05 DIAGNOSIS — Z5112 Encounter for antineoplastic immunotherapy: Secondary | ICD-10-CM | POA: Insufficient documentation

## 2020-01-05 DIAGNOSIS — Z8673 Personal history of transient ischemic attack (TIA), and cerebral infarction without residual deficits: Secondary | ICD-10-CM | POA: Diagnosis not present

## 2020-01-05 DIAGNOSIS — F418 Other specified anxiety disorders: Secondary | ICD-10-CM | POA: Diagnosis not present

## 2020-01-05 DIAGNOSIS — D509 Iron deficiency anemia, unspecified: Secondary | ICD-10-CM | POA: Insufficient documentation

## 2020-01-05 DIAGNOSIS — Z7189 Other specified counseling: Secondary | ICD-10-CM

## 2020-01-05 DIAGNOSIS — Z85038 Personal history of other malignant neoplasm of large intestine: Secondary | ICD-10-CM | POA: Diagnosis not present

## 2020-01-05 LAB — COMPREHENSIVE METABOLIC PANEL
ALT: 32 U/L (ref 0–44)
AST: 32 U/L (ref 15–41)
Albumin: 3.9 g/dL (ref 3.5–5.0)
Alkaline Phosphatase: 136 U/L — ABNORMAL HIGH (ref 38–126)
Anion gap: 8 (ref 5–15)
BUN: 22 mg/dL (ref 8–23)
CO2: 24 mmol/L (ref 22–32)
Calcium: 8.5 mg/dL — ABNORMAL LOW (ref 8.9–10.3)
Chloride: 107 mmol/L (ref 98–111)
Creatinine, Ser: 0.98 mg/dL (ref 0.61–1.24)
GFR calc Af Amer: >60 mL/min (ref >60)
GFR calc non Af Amer: >60 mL/min (ref >60)
Glucose, Bld: 104 mg/dL — ABNORMAL HIGH (ref 70–99)
Potassium: 3.9 mmol/L (ref 3.5–5.1)
Sodium: 139 mmol/L (ref 135–145)
Total Bilirubin: 0.5 mg/dL (ref 0.3–1.2)
Total Protein: 7.9 g/dL (ref 6.5–8.1)

## 2020-01-05 LAB — CBC WITH DIFFERENTIAL/PLATELET
Abs Immature Granulocytes: 0.03 10*3/uL (ref 0.00–0.07)
Basophils Absolute: 0.1 10*3/uL (ref 0.0–0.1)
Basophils Relative: 1 %
Eosinophils Absolute: 0.4 10*3/uL (ref 0.0–0.5)
Eosinophils Relative: 5 %
HCT: 41.9 % (ref 39.0–52.0)
Hemoglobin: 13.5 g/dL (ref 13.0–17.0)
Immature Granulocytes: 0 %
Lymphocytes Relative: 21 %
Lymphs Abs: 1.8 10*3/uL (ref 0.7–4.0)
MCH: 25.6 pg — ABNORMAL LOW (ref 26.0–34.0)
MCHC: 32.2 g/dL (ref 30.0–36.0)
MCV: 79.4 fL — ABNORMAL LOW (ref 80.0–100.0)
Monocytes Absolute: 0.7 10*3/uL (ref 0.1–1.0)
Monocytes Relative: 9 %
Neutro Abs: 5.3 10*3/uL (ref 1.7–7.7)
Neutrophils Relative %: 64 %
Platelets: 188 10*3/uL (ref 150–400)
RBC: 5.28 MIL/uL (ref 4.22–5.81)
RDW: 20.6 % — ABNORMAL HIGH (ref 11.5–15.5)
WBC: 8.3 10*3/uL (ref 4.0–10.5)
nRBC: 0 % (ref 0.0–0.2)

## 2020-01-05 LAB — TSH: TSH: 3.117 u[IU]/mL (ref 0.350–4.500)

## 2020-01-05 MED ORDER — HEPARIN SOD (PORK) LOCK FLUSH 100 UNIT/ML IV SOLN
INTRAVENOUS | Status: AC
  Filled 2020-01-05: qty 5

## 2020-01-05 MED ORDER — HEPARIN SOD (PORK) LOCK FLUSH 100 UNIT/ML IV SOLN
500.0000 [IU] | Freq: Once | INTRAVENOUS | Status: AC | PRN
  Administered 2020-01-05: 500 [IU]
  Filled 2020-01-05: qty 5

## 2020-01-05 MED ORDER — SODIUM CHLORIDE 0.9 % IV SOLN
Freq: Once | INTRAVENOUS | Status: AC
  Filled 2020-01-05: qty 250

## 2020-01-05 MED ORDER — SODIUM CHLORIDE 0.9 % IV SOLN
200.0000 mg | Freq: Once | INTRAVENOUS | Status: AC
  Administered 2020-01-05: 200 mg via INTRAVENOUS
  Filled 2020-01-05: qty 8

## 2020-01-05 MED ORDER — SODIUM CHLORIDE 0.9% FLUSH
10.0000 mL | Freq: Once | INTRAVENOUS | Status: AC
  Administered 2020-01-05: 10 mL via INTRAVENOUS
  Filled 2020-01-05: qty 10

## 2020-01-05 NOTE — Progress Notes (Signed)
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; # Colon ca- stage III  GOALS: palliative  CURRENT/MOST RECENT THERAPY: Keytruda [C]    Urothelial cancer ([place])   Initial Diagnosis   Urothelial cancer ([place])   Ureteral cancer, right ([place])  05/26/2018 Initial Diagnosis   Ureteral cancer, right ([place])   06/21/2018 -  Chemotherapy   The patient had pembrolizumab (KEYTRUDA) 200 mg in sodium chloride 0.9 % 50 mL chemo infusion, 200 mg, Intravenous, Once, 21 of 22 cycles Administration: 200 mg (06/21/2018), 200 mg (07/12/2018), 200 mg (08/02/2018), 200 mg (08/23/2018), 200 mg (09/13/2018), 200 mg (10/04/2018), 200 mg (10/25/2018), 200 mg (11/22/2018), 200 mg (12/13/2018), 200 mg (01/05/2019), 200 mg (01/24/2019), 200 mg (02/14/2019), 200 mg (03/11/2019), 200 mg (06/17/2019), 200 mg (08/26/2019), 200 mg (09/16/2019), 200 mg (10/07/2019), 200 mg (10/28/2019), 200 mg (11/24/2019), 200 mg (12/15/2019)  for chemotherapy treatment.       HISTORY OF PRESENTING ILLNESS: Patient is a poor historian.  Is alone.  Chad [name] 59 y.o.  male above history of stage IV-ureteral cancer/with multiple co-morbidities currently on Keytruda is here for follow-up.  His appetite is good. Is gaining weight. No nausea no vomiting. No diarrhea. Chronic joint pains not any worse.   Review of Systems  Constitutional: Positive for malaise/fatigue. Negative for chills, diaphoresis, fever  and weight loss.  HENT: Negative for nosebleeds and sore throat.   Eyes: Negative for double vision.  Respiratory: Positive for cough and shortness of breath. Negative for hemoptysis and wheezing.   Cardiovascular: Negative for chest pain, palpitations and orthopnea.  Gastrointestinal: Negative for abdominal pain, blood in stool, constipation, diarrhea, heartburn, melena, nausea and vomiting.  Genitourinary: Negative for dysuria, frequency and urgency.  Musculoskeletal: Positive for back pain and joint pain.  Skin: Negative.  Negative for itching and rash.  Neurological: Positive for focal weakness. Negative for dizziness, tingling, weakness and headaches.       Chronic left-sided weakness upper than lower extremity.  Endo/Heme/Allergies: Does not bruise/bleed easily.  Psychiatric/Behavioral: Negative for depression. The patient is not nervous/anxious and does not have insomnia.      MEDICAL HISTORY:  Past Medical History:  Diagnosis Date  . Anemia   . Anxiety   . ARF (acute respiratory failure) ([place])   . Bladder cancer ([place])   . Cancer [facility])    prostate  . COPD (chronic obstructive pulmonary disease) ([place])   . Depression   . Dysphagia   . Family history of colon cancer   . Family history of kidney cancer   . Family history of leukemia   . Family history of prostate cancer   . GERD (gastroesophageal reflux disease)   . Hepatitis    chronic hep c  . Hydronephrosis   . Hydronephrosis with ureteral stricture   . Hyperlipidemia   . Knee pain    Left  . Malignant neoplasm of colon ([place])   . Nerve pain   . Peripheral vascular disease ([place])   .  Stroke ([place])   . Urinary frequency   . Venous hypertension of both lower extremities     SURGICAL HISTORY: Past Surgical History:  Procedure Laterality Date  . COLON SURGERY     En bloc extended right hemicolectomy 07/2017  . CYSTOSCOPY W/ RETROGRADES Right 08/30/2018   Procedure: CYSTOSCOPY WITH RETROGRADE PYELOGRAM;  Surgeon:  Hollice Espy, MD;  Location: [facility] OR[facility];  Service: Urology;  Laterality: Right;  . CYSTOSCOPY WITH STENT PLACEMENT Right 04/25/2018   Procedure: CYSTOSCOPY WITH STENT PLACEMENT;  Surgeon: Hollice Espy, MD;  Location: [facility];  Service: Urology;  Laterality: Right;  . CYSTOSCOPY WITH STENT PLACEMENT Right 08/30/2018   Procedure: [place] WITH STENT Exchange;  Surgeon: Hollice Espy, MD;  Location: [facility];  Service: Urology;  Laterality: Right;  . CYSTOSCOPY WITH STENT PLACEMENT Right 03/07/2019   Procedure: CYSTOSCOPY WITH STENT Exchange;  Surgeon: Hollice Espy, MD;  Location: [facility];  Service: Urology;  Laterality: Right;  . CYSTOSCOPY WITH STENT PLACEMENT Right 11/21/2019   Procedure: CYSTOSCOPY WITH STENT Exchange;  Surgeon: Hollice Espy, MD;  Location: [facility];  Service: Urology;  Laterality: Right;  . LOWER EXTREMITY ANGIOGRAPHY Left 05/23/2019   Procedure: LOWER EXTREMITY ANGIOGRAPHY;  Surgeon: Algernon Huxley, MD;  Location: [facility];  Service: Cardiovascular;  Laterality: Left;  . LOWER EXTREMITY ANGIOGRAPHY Right 05/30/2019   Procedure: LOWER EXTREMITY ANGIOGRAPHY;  Surgeon: Algernon Huxley, MD;  Location: [facility];  Service: Cardiovascular;  Laterality: Right;  . PORTA CATH INSERTION N/A 02/28/2019   Procedure: PORTA CATH INSERTION;  Surgeon: Algernon Huxley, MD;  Location: [facility];  Service: Cardiovascular;  Laterality: N/A;  . tumor removed       SOCIAL HISTORY: Social History   Socioeconomic History  . Marital status: Single    Spouse name: Not on file  . Number of children: Not on file  . Years of education: Not on file  . Highest education level: Not on file  Occupational History  . Not on file  Tobacco Use  . Smoking status: Current Every Day Smoker    Packs/day: 1.00    Types: Cigarettes  . Smokeless tobacco: Never Used  Vaping Use  . Vaping Use: Never used  Substance and Sexual Activity  . Alcohol use: Not Currently  .  Drug use: Not Currently  . Sexual activity: Not on file  Other Topics Concern  . Not on file  Social History Narrative    used to live [place]; moved  To [place]- end of April 2019; in Nursing home; 1pp/day; quit alcohol. 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        Family- dad- prostate ca [at 88y]; brother- 23 died of prostate cancer; brother- 49- no cancers [[place]]; sonGerald Stabs [[place]];[name]-32y [place] ca [place] daughter- 56 [[place]]; another daughter 88 [[place]/addict]. will refer genetics counseling. Given MSI- abnormal; highly suspicious of Lynch syndrome.  Patient's son [name] aware of high possible lynch syndrome.   Social Determinants of Health   Financial Resource Strain:   . Difficulty of Paying Living Expenses:   Food Insecurity:   . Worried About Charity fundraiser in the Last Year:   . Arboriculturist in the Last Year:   Transportation Needs:   . Film/video editor (Medical):   Marland Kitchen Lack of Transportation (Non-Medical):   Physical Activity:   . Days of Exercise per Week:   . Minutes of Exercise per Session:   Stress:   .  Feeling of Stress :   Social Connections:   . Frequency of Communication with Friends and Family:   . Frequency of Social Gatherings with Friends and Family:   . Attends Religious Services:   . Active Member of Clubs or Organizations:   . Attends Archivist Meetings:   Marland Kitchen Marital Status:   Intimate Partner Violence:   . Fear of Current or Ex-Partner:   . Emotionally Abused:   Marland Kitchen Physically Abused:   . Sexually Abused:     FAMILY HISTORY: Family History  Problem Relation Age of Onset  . Prostate cancer Father 11  . Cancer Brother 38       unsure type  . Cancer Paternal Uncle        unsure type  . Cancer Maternal Grandmother        unsure type  . Cancer Paternal Grandmother        unsure type  . Kidney cancer Paternal Grandfather   . Cancer Other        unsure types  . Leukemia Son   . Cancer Son        other cancers, possibly  colon    ALLERGIES:  is allergic to penicillins.  MEDICATIONS:  Current Outpatient Medications  Medication Sig Dispense Refill  . aspirin EC 81 MG tablet Take 1 tablet (81 mg total) by mouth daily. 150 tablet 2  . atorvastatin (LIPITOR) 10 MG tablet Take 1 tablet (10 mg total) by mouth daily. 30 tablet 11  . cyclobenzaprine (FLEXERIL) 10 MG tablet Take 10 mg by mouth at bedtime.    . diclofenac sodium (VOLTAREN) 1 % GEL Apply 2 g topically See admin instructions. Apply 2 g topically 4 times daily. Apply 2 g to left shoulder, apply 2 g to left hip and 2 g to left knee 4 times daily per [facility] instructions    . escitalopram (LEXAPRO) 5 MG tablet Take 5 mg by mouth daily.     Marland Kitchen gabapentin (NEURONTIN) 100 MG capsule Take 100 mg by mouth 3 (three) times daily.    . mirtazapine (REMERON) 7.5 MG tablet Take 7.5 mg by mouth at bedtime.    Marland Kitchen oxycodone (OXY-IR) 5 MG capsule Take 10 mg by mouth every 6 (six) hours as needed for pain.     . pantoprazole (PROTONIX) 40 MG tablet Take 40 mg by mouth daily.    Marland Kitchen acetaminophen (TYLENOL) 325 MG tablet Take 650 mg by mouth 3 (three) times daily. (Patient not taking: Reported on 01/05/2020)    . clopidogrel (PLAVIX) 75 MG tablet Take 1 tablet (75 mg total) by mouth daily. (Patient not taking: Reported on 01/05/2020) 30 tablet 11  . collagenase (SANTYL) ointment Apply 1 application topically daily. LEFT LOWER LEG (Patient not taking: Reported on 01/05/2020)     No current facility-administered medications for this visit.   Facility-Administered Medications Ordered in Other Visits  Medication Dose Route Frequency Provider Last Rate Last Admin  . [COMPLETED] heparin lock flush 100 unit/mL  500 Units Intracatheter Once PRN Cammie Sickle, MD   500 Units at 01/05/20 1130  . pembrolizumab (KEYTRUDA) 200 mg in sodium chloride 0.9 % 50 mL chemo infusion  200 mg Intravenous Once Cammie Sickle, MD 116 mL/hr at 01/05/20 1037 200 mg at 01/05/20 1037       .  PHYSICAL EXAMINATION: ECOG PERFORMANCE STATUS: 1 - Symptomatic but completely ambulatory  Vitals:   01/05/20 0911  BP: (!) 132/99  Pulse: 80  Temp: (!) 95.4 F (35.2 C)   Filed Weights   01/05/20 0911  Weight: 175 lb (79.4 kg)    Physical Exam Constitutional:      Comments: In wheelchair. Cachectic.  Appears older than his stated age.  Alone.  HENT:     Head: Normocephalic and atraumatic.     Mouth/Throat:     Pharynx: No oropharyngeal exudate.  Eyes:     Pupils: Pupils are equal, round, and reactive to light.  Cardiovascular:     Rate and Rhythm: Normal rate and regular rhythm.  Pulmonary:     Effort: No respiratory distress.     Breath sounds: No wheezing.  Abdominal:     General: Bowel sounds are normal. There is no distension.     Palpations: Abdomen is soft. There is no mass.     Tenderness: There is no abdominal tenderness. There is no guarding or rebound.  Musculoskeletal:        General: No tenderness. Normal range of motion.     Cervical back: Normal range of motion and neck supple.  Skin:    General: Skin is warm.     Comments: Bilateral lower extremity ulcerations noted.  Pulses intact bilaterally.  Neurological:     Mental Status: He is oriented to person, place, and time.     Comments: Sleepy but easily arousable.  Chronic weakness left upper and lower extremity.  Psychiatric:        Mood and Affect: Affect normal.      LABORATORY DATA:  I have reviewed the data as listed Lab Results  Component Value Date   WBC 8.3 01/05/2020   HGB 13.5 01/05/2020   HCT 41.9 01/05/2020   MCV 79.4 (L) 01/05/2020   PLT 188 01/05/2020   Recent Labs    11/24/19 0853 12/15/19 0901 01/05/20 0900  NA 138 138 139  K 3.8 4.1 3.9  CL 105 104 107  CO2 '25 25 24  ' GLUCOSE 113* 85 104*  BUN '23 22 22  ' CREATININE 1.04 0.89 0.98  CALCIUM 8.6* 8.5* 8.5*  GFRNONAA >60 >60 >60  GFRAA >60 >60 >60  PROT 7.6 7.3 7.9  ALBUMIN 3.7 3.6 3.9  AST 28 30 32  ALT 23 25  32  ALKPHOS 101 128* 136*  BILITOT 0.4 0.5 0.5    RADIOGRAPHIC STUDIES: I have personally reviewed the radiological images as listed and agreed with the findings in the report. CT CHEST W CONTRAST  Result Date: 12/09/2019 CLINICAL DATA:  Restaging urothelial carcinoma EXAM: CT CHEST, ABDOMEN, AND PELVIS WITH CONTRAST TECHNIQUE: Multidetector CT imaging of the chest, abdomen and pelvis was performed following the standard protocol during bolus administration of intravenous contrast. CONTRAST:  18m OMNIPAQUE IOHEXOL 300 MG/ML  SOLN COMPARISON:  CT AP 08/24/2019 and CT cap 04/14/2019 FINDINGS: CT CHEST FINDINGS Cardiovascular: Heart size appears within normal limits. No pericardial effusion. Aortic atherosclerosis. Lad and RCA coronary artery calcifications. Mediastinum/Nodes: Normal appearance of the thyroid gland. The trachea appears patent and is midline. Normal appearance of the esophagus. No mediastinal or hilar adenopathy. Morphologically benign right axillary lymph node is again noted with prominent fatty hilum measuring 0.8 cm short axis, unchanged. Lungs/Pleura: No pleural effusion identified. No airspace consolidation, atelectasis, or pneumothorax. Several calcified granulomas identified within the right upper lobe. No suspicious lung lesions. Musculoskeletal: No aggressive lytic or sclerotic bone lesions. CT ABDOMEN PELVIS FINDINGS Hepatobiliary: Caudate and lateral segment left lobe of liver enlargement with subtle irregular hepatic capsule. No focal liver  lesions identified. Gallbladder negative. No bile duct dilatation. Pancreas: Unremarkable. No pancreatic ductal dilatation or surrounding inflammatory changes. Spleen: Normal in size without focal abnormality. Adrenals/Urinary Tract: Normal appearance of the adrenal glands. Inferior pole left kidney cyst measures 1.5 cm. Asymmetric cortical volume loss from the right kidney which appears hydronephrotic containing nephroureteral stent. Similar  appearance of wall thickening and soft tissue stranding of the right renal pelvis and right UPJ. This measures 3.2 x 2.6 by 3.5 cm. No additional abnormal areas within the upper urinary tract. No suspicious enhancing bladder lesions. Stomach/Bowel: Stomach is within normal limits. No evidence of bowel wall thickening, distention, or inflammatory changes. Vascular/Lymphatic: Aortic atherosclerosis without aneurysm. Upper normal hepatic duodenal ligament lymph nodes are stable. No retroperitoneal adenopathy. Upper limits of normal bilateral pelvic sidewall lymph nodes are identified, similar. Index left iliac node measures 0.7 cm, image 103/507. Previously 0.9 cm. Right iliac node measures 0.7 cm, image 99/507. Previously 0.8 cm. Index right inguinal lymph node is unchanged measuring 8 mm, image 111/507. Index left inguinal lymph node measures 1.5 cm, image 131/507. Stable. Reproductive: Prostate gland enlargement. Other: No abdominopelvic ascites. Musculoskeletal: No acute or significant osseous findings. IMPRESSION: 1. Stable appearance of soft tissue stranding and wall thickening of the right renal pelvis and right UPJ. No specific findings identified to suggest metastatic disease within the chest, abdomen or pelvis. 2. Similar appearance of right-sided hydronephrosis containing nephroureteral stent. 3. Morphologic features of liver suggestive of early cirrhosis. 4. Aortic atherosclerosis, in addition to LAD and RCA coronary artery disease. Please note that although the presence of coronary artery calcium documents the presence of coronary artery disease, the severity of this disease and any potential stenosis cannot be assessed on this non-gated CT examination. Assessment for potential risk factor modification, dietary therapy or pharmacologic therapy may be warranted, if clinically indicated. Aortic Atherosclerosis (ICD10-I70.0). Electronically Signed   By: Kerby Moors M.D.   On: 12/09/2019 21:01   CT  Abdomen Pelvis W Contrast  Result Date: 12/09/2019 CLINICAL DATA:  Restaging urothelial carcinoma EXAM: CT CHEST, ABDOMEN, AND PELVIS WITH CONTRAST TECHNIQUE: Multidetector CT imaging of the chest, abdomen and pelvis was performed following the standard protocol during bolus administration of intravenous contrast. CONTRAST:  156m OMNIPAQUE IOHEXOL 300 MG/ML  SOLN COMPARISON:  CT AP 08/24/2019 and CT cap 04/14/2019 FINDINGS: CT CHEST FINDINGS Cardiovascular: Heart size appears within normal limits. No pericardial effusion. Aortic atherosclerosis. Lad and RCA coronary artery calcifications. Mediastinum/Nodes: Normal appearance of the thyroid gland. The trachea appears patent and is midline. Normal appearance of the esophagus. No mediastinal or hilar adenopathy. Morphologically benign right axillary lymph node is again noted with prominent fatty hilum measuring 0.8 cm short axis, unchanged. Lungs/Pleura: No pleural effusion identified. No airspace consolidation, atelectasis, or pneumothorax. Several calcified granulomas identified within the right upper lobe. No suspicious lung lesions. Musculoskeletal: No aggressive lytic or sclerotic bone lesions. CT ABDOMEN PELVIS FINDINGS Hepatobiliary: Caudate and lateral segment left lobe of liver enlargement with subtle irregular hepatic capsule. No focal liver lesions identified. Gallbladder negative. No bile duct dilatation. Pancreas: Unremarkable. No pancreatic ductal dilatation or surrounding inflammatory changes. Spleen: Normal in size without focal abnormality. Adrenals/Urinary Tract: Normal appearance of the adrenal glands. Inferior pole left kidney cyst measures 1.5 cm. Asymmetric cortical volume loss from the right kidney which appears hydronephrotic containing nephroureteral stent. Similar appearance of wall thickening and soft tissue stranding of the right renal pelvis and right UPJ. This measures 3.2 x 2.6 by 3.5 cm. No additional abnormal  areas within the upper  urinary tract. No suspicious enhancing bladder lesions. Stomach/Bowel: Stomach is within normal limits. No evidence of bowel wall thickening, distention, or inflammatory changes. Vascular/Lymphatic: Aortic atherosclerosis without aneurysm. Upper normal hepatic duodenal ligament lymph nodes are stable. No retroperitoneal adenopathy. Upper limits of normal bilateral pelvic sidewall lymph nodes are identified, similar. Index left iliac node measures 0.7 cm, image 103/507. Previously 0.9 cm. Right iliac node measures 0.7 cm, image 99/507. Previously 0.8 cm. Index right inguinal lymph node is unchanged measuring 8 mm, image 111/507. Index left inguinal lymph node measures 1.5 cm, image 131/507. Stable. Reproductive: Prostate gland enlargement. Other: No abdominopelvic ascites. Musculoskeletal: No acute or significant osseous findings. IMPRESSION: 1. Stable appearance of soft tissue stranding and wall thickening of the right renal pelvis and right UPJ. No specific findings identified to suggest metastatic disease within the chest, abdomen or pelvis. 2. Similar appearance of right-sided hydronephrosis containing nephroureteral stent. 3. Morphologic features of liver suggestive of early cirrhosis. 4. Aortic atherosclerosis, in addition to LAD and RCA coronary artery disease. Please note that although the presence of coronary artery calcium documents the presence of coronary artery disease, the severity of this disease and any potential stenosis cannot be assessed on this non-gated CT examination. Assessment for potential risk factor modification, dietary therapy or pharmacologic therapy may be warranted, if clinically indicated. Aortic Atherosclerosis (ICD10-I70.0). Electronically Signed   By: Kerby Moors M.D.   On: 12/09/2019 21:01    ASSESSMENT & PLAN:   Ureteral cancer, right ([place]) # High-grade urothelial cancer/cytology; likely of the right renal pelvis /upper ureter.  May 2021 C/A/P-stable; Slight ureteral  thickening/small pelvic/retroperitoneal lymph nodes.  No lesions in the liver. STABLE.   # Proceed with [place] today; Labs today reviewed;  acceptable for treatment today.  # Iron deficiency anemia-hemoglobin 13-improved. HOLD venofer today  # Bilateral LE ulcers-s/p wound care evaluation; s/p PTCA with Dr.Dew. bil Arterial Dopp-wnl. STABLE>    #DISPOSITION: [name] today;  # follow up in 3 weeks -MD labs-cbc/cmp; TSH;Keytruda/- Dr.B     All questions were answered. The patient knows to call the clinic with any problems, questions or concerns.    Cammie Sickle, MD 01/05/2020 10:45 AM

## 2020-01-05 NOTE — Assessment & Plan Note (Addendum)
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  May 2021 C/A/P-stable; Slight ureteral thickening/small pelvic/retroperitoneal lymph nodes.  No lesions in the liver. STABLE.   # Proceed with [place] today; Labs today reviewed;  acceptable for treatment today.  # Iron deficiency anemia-hemoglobin 13-improved. HOLD venofer today  # Bilateral LE ulcers-s/p wound care evaluation; s/p PTCA with Dr.Dew. bil Arterial Dopp-wnl. STABLE>    #DISPOSITION: Chad [name] today;  # follow up in 3 weeks -MD labs-cbc/cmp; TSH;Keytruda/- Dr.B

## 2020-01-26 ENCOUNTER — Other Ambulatory Visit: Payer: Self-pay

## 2020-01-26 ENCOUNTER — Inpatient Hospital Stay: Payer: Medicaid Other | Attending: Internal Medicine

## 2020-01-26 ENCOUNTER — Inpatient Hospital Stay: Payer: Medicaid Other

## 2020-01-26 ENCOUNTER — Inpatient Hospital Stay (HOSPITAL_BASED_OUTPATIENT_CLINIC_OR_DEPARTMENT_OTHER): Payer: Medicaid Other | Admitting: Internal Medicine

## 2020-01-26 VITALS — BP 138/87 | HR 63 | Resp 18

## 2020-01-26 DIAGNOSIS — F418 Other specified anxiety disorders: Secondary | ICD-10-CM | POA: Insufficient documentation

## 2020-01-26 DIAGNOSIS — R531 Weakness: Secondary | ICD-10-CM | POA: Insufficient documentation

## 2020-01-26 DIAGNOSIS — R5381 Other malaise: Secondary | ICD-10-CM | POA: Insufficient documentation

## 2020-01-26 DIAGNOSIS — I739 Peripheral vascular disease, unspecified: Secondary | ICD-10-CM | POA: Insufficient documentation

## 2020-01-26 DIAGNOSIS — E785 Hyperlipidemia, unspecified: Secondary | ICD-10-CM | POA: Insufficient documentation

## 2020-01-26 DIAGNOSIS — D509 Iron deficiency anemia, unspecified: Secondary | ICD-10-CM | POA: Diagnosis not present

## 2020-01-26 DIAGNOSIS — Z79899 Other long term (current) drug therapy: Secondary | ICD-10-CM | POA: Insufficient documentation

## 2020-01-26 DIAGNOSIS — Z791 Long term (current) use of non-steroidal anti-inflammatories (NSAID): Secondary | ICD-10-CM | POA: Diagnosis not present

## 2020-01-26 DIAGNOSIS — Z8042 Family history of malignant neoplasm of prostate: Secondary | ICD-10-CM | POA: Insufficient documentation

## 2020-01-26 DIAGNOSIS — Z9221 Personal history of antineoplastic chemotherapy: Secondary | ICD-10-CM | POA: Diagnosis not present

## 2020-01-26 DIAGNOSIS — C661 Malignant neoplasm of right ureter: Secondary | ICD-10-CM | POA: Diagnosis not present

## 2020-01-26 DIAGNOSIS — F17210 Nicotine dependence, cigarettes, uncomplicated: Secondary | ICD-10-CM | POA: Diagnosis not present

## 2020-01-26 DIAGNOSIS — M2550 Pain in unspecified joint: Secondary | ICD-10-CM | POA: Insufficient documentation

## 2020-01-26 DIAGNOSIS — R5383 Other fatigue: Secondary | ICD-10-CM | POA: Diagnosis not present

## 2020-01-26 DIAGNOSIS — J449 Chronic obstructive pulmonary disease, unspecified: Secondary | ICD-10-CM | POA: Insufficient documentation

## 2020-01-26 DIAGNOSIS — Z7982 Long term (current) use of aspirin: Secondary | ICD-10-CM | POA: Insufficient documentation

## 2020-01-26 DIAGNOSIS — Z85038 Personal history of other malignant neoplasm of large intestine: Secondary | ICD-10-CM | POA: Insufficient documentation

## 2020-01-26 DIAGNOSIS — Z806 Family history of leukemia: Secondary | ICD-10-CM | POA: Diagnosis not present

## 2020-01-26 DIAGNOSIS — B182 Chronic viral hepatitis C: Secondary | ICD-10-CM | POA: Diagnosis not present

## 2020-01-26 DIAGNOSIS — Z5112 Encounter for antineoplastic immunotherapy: Secondary | ICD-10-CM | POA: Diagnosis not present

## 2020-01-26 DIAGNOSIS — R64 Cachexia: Secondary | ICD-10-CM | POA: Insufficient documentation

## 2020-01-26 DIAGNOSIS — Z8673 Personal history of transient ischemic attack (TIA), and cerebral infarction without residual deficits: Secondary | ICD-10-CM | POA: Insufficient documentation

## 2020-01-26 DIAGNOSIS — G8929 Other chronic pain: Secondary | ICD-10-CM | POA: Insufficient documentation

## 2020-01-26 DIAGNOSIS — I10 Essential (primary) hypertension: Secondary | ICD-10-CM | POA: Diagnosis not present

## 2020-01-26 DIAGNOSIS — Z515 Encounter for palliative care: Secondary | ICD-10-CM

## 2020-01-26 DIAGNOSIS — K219 Gastro-esophageal reflux disease without esophagitis: Secondary | ICD-10-CM | POA: Insufficient documentation

## 2020-01-26 LAB — CBC WITH DIFFERENTIAL/PLATELET
Abs Immature Granulocytes: 0.02 10*3/uL (ref 0.00–0.07)
Basophils Absolute: 0.1 10*3/uL (ref 0.0–0.1)
Basophils Relative: 1 %
Eosinophils Absolute: 0.4 10*3/uL (ref 0.0–0.5)
Eosinophils Relative: 4 %
HCT: 42.1 % (ref 39.0–52.0)
Hemoglobin: 13.5 g/dL (ref 13.0–17.0)
Immature Granulocytes: 0 %
Lymphocytes Relative: 18 %
Lymphs Abs: 1.5 10*3/uL (ref 0.7–4.0)
MCH: 25.6 pg — ABNORMAL LOW (ref 26.0–34.0)
MCHC: 32.1 g/dL (ref 30.0–36.0)
MCV: 79.7 fL — ABNORMAL LOW (ref 80.0–100.0)
Monocytes Absolute: 0.6 10*3/uL (ref 0.1–1.0)
Monocytes Relative: 7 %
Neutro Abs: 5.8 10*3/uL (ref 1.7–7.7)
Neutrophils Relative %: 70 %
Platelets: 170 10*3/uL (ref 150–400)
RBC: 5.28 MIL/uL (ref 4.22–5.81)
RDW: 18.5 % — ABNORMAL HIGH (ref 11.5–15.5)
WBC: 8.4 10*3/uL (ref 4.0–10.5)
nRBC: 0 % (ref 0.0–0.2)

## 2020-01-26 LAB — COMPREHENSIVE METABOLIC PANEL
ALT: 26 U/L (ref 0–44)
AST: 31 U/L (ref 15–41)
Albumin: 3.9 g/dL (ref 3.5–5.0)
Alkaline Phosphatase: 125 U/L (ref 38–126)
Anion gap: 8 (ref 5–15)
BUN: 20 mg/dL (ref 8–23)
CO2: 27 mmol/L (ref 22–32)
Calcium: 8.8 mg/dL — ABNORMAL LOW (ref 8.9–10.3)
Chloride: 104 mmol/L (ref 98–111)
Creatinine, Ser: 0.93 mg/dL (ref 0.61–1.24)
GFR calc Af Amer: >60 mL/min (ref >60)
GFR calc non Af Amer: >60 mL/min (ref >60)
Glucose, Bld: 94 mg/dL (ref 70–99)
Potassium: 4.2 mmol/L (ref 3.5–5.1)
Sodium: 139 mmol/L (ref 135–145)
Total Bilirubin: 0.6 mg/dL (ref 0.3–1.2)
Total Protein: 8 g/dL (ref 6.5–8.1)

## 2020-01-26 LAB — TSH: TSH: 1.358 u[IU]/mL (ref 0.350–4.500)

## 2020-01-26 MED ORDER — SODIUM CHLORIDE 0.9 % IV SOLN
Freq: Once | INTRAVENOUS | Status: AC
  Filled 2020-01-26: qty 250

## 2020-01-26 MED ORDER — SODIUM CHLORIDE 0.9% FLUSH
10.0000 mL | INTRAVENOUS | Status: DC | PRN
  Administered 2020-01-26: 10 mL via INTRAVENOUS
  Filled 2020-01-26: qty 10

## 2020-01-26 MED ORDER — SODIUM CHLORIDE 0.9 % IV SOLN
200.0000 mg | Freq: Once | INTRAVENOUS | Status: AC
  Administered 2020-01-26: 200 mg via INTRAVENOUS
  Filled 2020-01-26: qty 8

## 2020-01-26 MED ORDER — HEPARIN SOD (PORK) LOCK FLUSH 100 UNIT/ML IV SOLN
INTRAVENOUS | Status: AC
  Filled 2020-01-26: qty 5

## 2020-01-26 MED ORDER — HEPARIN SOD (PORK) LOCK FLUSH 100 UNIT/ML IV SOLN
500.0000 [IU] | Freq: Once | INTRAVENOUS | Status: AC
  Administered 2020-01-26: 500 [IU] via INTRAVENOUS
  Filled 2020-01-26: qty 5

## 2020-01-26 NOTE — Progress Notes (Signed)
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; # Colon ca- stage III  GOALS: palliative  CURRENT/MOST RECENT THERAPY: Keytruda [C]    Urothelial cancer ([place])   Initial Diagnosis   Urothelial cancer ([place])   Ureteral cancer, right ([place])  05/26/2018 Initial Diagnosis   Ureteral cancer, right ([place])   06/21/2018 -  Chemotherapy   The patient had pembrolizumab (KEYTRUDA) 200 mg in sodium chloride 0.9 % 50 mL chemo infusion, 200 mg, Intravenous, Once, 21 of 25 cycles Administration: 200 mg (06/21/2018), 200 mg (07/12/2018), 200 mg (08/02/2018), 200 mg (08/23/2018), 200 mg (09/13/2018), 200 mg (10/04/2018), 200 mg (10/25/2018), 200 mg (11/22/2018), 200 mg (12/13/2018), 200 mg (01/05/2019), 200 mg (01/24/2019), 200 mg (02/14/2019), 200 mg (03/11/2019), 200 mg (06/17/2019), 200 mg (08/26/2019), 200 mg (09/16/2019), 200 mg (10/07/2019), 200 mg (10/28/2019), 200 mg (11/24/2019), 200 mg (12/15/2019), 200 mg (01/05/2020)  for chemotherapy treatment.       HISTORY OF PRESENTING ILLNESS: Patient is a poor historian.  Is alone.  Chad [name] 59 y.o.  male above history of stage IV-ureteral cancer/with multiple co-morbidities currently on Keytruda is here for follow-up.  Denies any nausea vomiting.  Denies any diarrhea.  Chronic joint pains.  Chronic shortness of breath chronic cough not any worse.  Review of Systems  Constitutional: Positive for malaise/fatigue.  Negative for chills, diaphoresis, fever and weight loss.  HENT: Negative for nosebleeds and sore throat.   Eyes: Negative for double vision.  Respiratory: Positive for cough and shortness of breath. Negative for hemoptysis and wheezing.   Cardiovascular: Negative for chest pain, palpitations and orthopnea.  Gastrointestinal: Negative for abdominal pain, blood in stool, constipation, diarrhea, heartburn, melena, nausea and vomiting.  Genitourinary: Negative for dysuria, frequency and urgency.  Musculoskeletal: Positive for back pain and joint pain.  Skin: Negative.  Negative for itching and rash.  Neurological: Positive for focal weakness. Negative for dizziness, tingling, weakness and headaches.       Chronic left-sided weakness upper than lower extremity.  Endo/Heme/Allergies: Does not bruise/bleed easily.  Psychiatric/Behavioral: Negative for depression. The patient is not nervous/anxious and does not have insomnia.      MEDICAL HISTORY:  Past Medical History:  Diagnosis Date  . Anemia   . Anxiety   . ARF (acute respiratory failure) ([place])   . Bladder cancer ([place])   . Cancer [facility])    prostate  . COPD (chronic obstructive pulmonary disease) ([place])   . Depression   . Dysphagia   . Family history of colon cancer   . Family history of kidney cancer   . Family history of leukemia   . Family history of prostate cancer   . GERD (gastroesophageal reflux disease)   . Hepatitis    chronic hep c  . Hydronephrosis   . Hydronephrosis with ureteral stricture   . Hyperlipidemia   . Knee pain    Left  . Malignant neoplasm of colon ([place])   . Nerve pain   .  Peripheral vascular disease ([place])   . Stroke ([place])   . Urinary frequency   . Venous hypertension of both lower extremities     SURGICAL HISTORY: Past Surgical History:  Procedure Laterality Date  . COLON SURGERY     En bloc extended right hemicolectomy 07/2017  . CYSTOSCOPY W/ RETROGRADES Right 08/30/2018   Procedure: CYSTOSCOPY  WITH RETROGRADE PYELOGRAM;  Surgeon: Hollice Espy, MD;  Location: [facility];  Service: Urology;  Laterality: Right;  . CYSTOSCOPY WITH STENT PLACEMENT Right 04/25/2018   Procedure: CYSTOSCOPY WITH STENT PLACEMENT;  Surgeon: Hollice Espy, MD;  Location: [facility];  Service: Urology;  Laterality: Right;  . CYSTOSCOPY WITH STENT PLACEMENT Right 08/30/2018   Procedure: [place] WITH STENT Exchange;  Surgeon: Hollice Espy, MD;  Location: [facility];  Service: Urology;  Laterality: Right;  . CYSTOSCOPY WITH STENT PLACEMENT Right 03/07/2019   Procedure: CYSTOSCOPY WITH STENT Exchange;  Surgeon: Hollice Espy, MD;  Location: [facility];  Service: Urology;  Laterality: Right;  . CYSTOSCOPY WITH STENT PLACEMENT Right 11/21/2019   Procedure: CYSTOSCOPY WITH STENT Exchange;  Surgeon: Hollice Espy, MD;  Location: [facility];  Service: Urology;  Laterality: Right;  . LOWER EXTREMITY ANGIOGRAPHY Left 05/23/2019   Procedure: LOWER EXTREMITY ANGIOGRAPHY;  Surgeon: Algernon Huxley, MD;  Location: [facility];  Service: Cardiovascular;  Laterality: Left;  . LOWER EXTREMITY ANGIOGRAPHY Right 05/30/2019   Procedure: LOWER EXTREMITY ANGIOGRAPHY;  Surgeon: Algernon Huxley, MD;  Location: [facility] [facility];  Service: Cardiovascular;  Laterality: Right;  . PORTA CATH INSERTION N/A 02/28/2019   Procedure: PORTA CATH INSERTION;  Surgeon: Algernon Huxley, MD;  Location: [facility];  Service: Cardiovascular;  Laterality: N/A;  . tumor removed       SOCIAL HISTORY: Social History   Socioeconomic History  . Marital status: Single    Spouse name: Not on file  . Number of children: Not on file  . Years of education: Not on file  . Highest education level: Not on file  Occupational History  . Not on file  Tobacco Use  . Smoking status: Current Every Day Smoker    Packs/day: 1.00    Types: Cigarettes  . Smokeless tobacco: Never Used  Vaping Use  . Vaping Use: Never used  Substance and Sexual Activity   . Alcohol use: Not Currently  . Drug use: Not Currently  . Sexual activity: Not on file  Other Topics Concern  . Not on file  Social History Narrative    used to live [place]; moved  To [place]- end of April 2019; in Nursing home; 1pp/day; quit alcohol. 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        Family- dad- prostate ca [at 31y]; brother- 58 died of prostate cancer; brother- 31- no cancers [[place]]; sonGerald Stabs [];[name]-32y prostate ca [facility] [place]]; daughter- 33 [[place]]; another daughter 61 [[place]/addict]. will refer genetics counseling. Given MSI- abnormal; highly suspicious of Lynch syndrome.  Patient's son [name] aware of high possible lynch syndrome.   Social Determinants of Health   Financial Resource Strain:   . Difficulty of Paying Living Expenses:   Food Insecurity:   . Worried About Charity fundraiser in the Last Year:   . Arboriculturist in the Last Year:   Transportation Needs:   . Film/video editor (Medical):   Marland Kitchen Lack of Transportation (Non-Medical):   Physical Activity:   . Days of Exercise per Week:   . Minutes  of Exercise per Session:   Stress:   . Feeling of Stress :   Social Connections:   . Frequency of Communication with Friends and Family:   . Frequency of Social Gatherings with Friends and Family:   . Attends Religious Services:   . Active Member of Clubs or Organizations:   . Attends Archivist Meetings:   Marland Kitchen Marital Status:   Intimate Partner Violence:   . Fear of Current or Ex-Partner:   . Emotionally Abused:   Marland Kitchen Physically Abused:   . Sexually Abused:     FAMILY HISTORY: Family History  Problem Relation Age of Onset  . Prostate cancer Father 57  . Cancer Brother 25       unsure type  . Cancer Paternal Uncle        unsure type  . Cancer Maternal Grandmother        unsure type  . Cancer Paternal Grandmother        unsure type  . Kidney cancer Paternal Grandfather   . Cancer Other        unsure types  . Leukemia Son   . Cancer Son         other cancers, possibly colon    ALLERGIES:  is allergic to penicillins.  MEDICATIONS:  Current Outpatient Medications  Medication Sig Dispense Refill  . acetaminophen (TYLENOL) 325 MG tablet Take 650 mg by mouth 3 (three) times daily.     Marland Kitchen aspirin EC 81 MG tablet Take 1 tablet (81 mg total) by mouth daily. 150 tablet 2  . atorvastatin (LIPITOR) 10 MG tablet Take 1 tablet (10 mg total) by mouth daily. 30 tablet 11  . collagenase (SANTYL) ointment Apply 1 application topically daily. LEFT LOWER LEG    . cyclobenzaprine (FLEXERIL) 10 MG tablet Take 10 mg by mouth at bedtime.    . diclofenac sodium (VOLTAREN) 1 % GEL Apply 2 g topically See admin instructions. Apply 2 g topically 4 times daily. Apply 2 g to left shoulder, apply 2 g to left hip and 2 g to left knee 4 times daily per [facility] instructions    . escitalopram (LEXAPRO) 5 MG tablet Take 5 mg by mouth daily.     Marland Kitchen gabapentin (NEURONTIN) 100 MG capsule Take 100 mg by mouth 3 (three) times daily.    . mirtazapine (REMERON) 7.5 MG tablet Take 7.5 mg by mouth at bedtime.    Marland Kitchen oxycodone (OXY-IR) 5 MG capsule Take 10 mg by mouth every 6 (six) hours as needed for pain.     . pantoprazole (PROTONIX) 40 MG tablet Take 40 mg by mouth daily.    . clopidogrel (PLAVIX) 75 MG tablet Take 1 tablet (75 mg total) by mouth daily. (Patient not taking: Reported on 01/05/2020) 30 tablet 11   No current facility-administered medications for this visit.   Facility-Administered Medications Ordered in Other Visits  Medication Dose Route Frequency Provider Last Rate Last Admin  . heparin lock flush 100 unit/mL  500 Units Intravenous Once Charlaine Dalton R, MD      . sodium chloride flush (NS) 0.9 % injection 10 mL  10 mL Intravenous PRN Cammie Sickle, MD   10 mL at 01/26/20 0834      .  PHYSICAL EXAMINATION: ECOG PERFORMANCE STATUS: 1 - Symptomatic but completely ambulatory  Vitals:   01/26/20 0855  BP: 128/80  Pulse: 68  Temp: (!)  97 F (36.1 C)   Filed Weights   01/26/20 8101  Weight: 177 lb 6 oz (80.5 kg)    Physical Exam Constitutional:      Comments: In wheelchair. Cachectic.  Appears older than his stated age.  Alone.  HENT:     Head: Normocephalic and atraumatic.     Mouth/Throat:     Pharynx: No oropharyngeal exudate.  Eyes:     Pupils: Pupils are equal, round, and reactive to light.  Cardiovascular:     Rate and Rhythm: Normal rate and regular rhythm.  Pulmonary:     Effort: No respiratory distress.     Breath sounds: No wheezing.  Abdominal:     General: Bowel sounds are normal. There is no distension.     Palpations: Abdomen is soft. There is no mass.     Tenderness: There is no abdominal tenderness. There is no guarding or rebound.  Musculoskeletal:        General: No tenderness. Normal range of motion.     Cervical back: Normal range of motion and neck supple.  Skin:    General: Skin is warm.     Comments: Bilateral lower extremity ulcerations noted.  Pulses intact bilaterally.  Neurological:     Mental Status: He is oriented to person, place, and time.     Comments: Sleepy but easily arousable.  Chronic weakness left upper and lower extremity.  Psychiatric:        Mood and Affect: Affect normal.      LABORATORY DATA:  I have reviewed the data as listed Lab Results  Component Value Date   WBC 8.4 01/26/2020   HGB 13.5 01/26/2020   HCT 42.1 01/26/2020   MCV 79.7 (L) 01/26/2020   PLT 170 01/26/2020   Recent Labs    12/15/19 0901 01/05/20 0900 01/26/20 0834  NA 138 139 139  K 4.1 3.9 4.2  CL 104 107 104  CO2 _0 GLUCOSE 85 104* 94  BUN _1 CREATININE 0.89 0.98 0.93  CALCIUM 8.5* 8.5* 8.8*  GFRNONAA >60 >60 >60  GFRAA >60 >60 >60  PROT 7.3 7.9 8.0  ALBUMIN 3.6 3.9 3.9  AST 30 32 31  ALT 25 32 26  ALKPHOS 128* 136* 125  BILITOT 0.5 0.5 0.6    RADIOGRAPHIC STUDIES: I have personally reviewed the radiological images as listed and agreed with the  findings in the report. No results found.  ASSESSMENT & PLAN:   Ureteral cancer, right ([place]) # High-grade urothelial cancer/cytology; likely of the right renal pelvis /upper ureter.  May 2021 C/A/P-stable; Slight ureteral thickening/small pelvic/retroperitoneal lymph nodes.  No lesions in the liver. STABLE  # Proceed with [place] today; Labs today reviewed;  acceptable for treatment today.  # Iron deficiency anemia-hemoglobin 13-improved. STABLE.   # Bilateral LE ulcers-s/p wound care evaluation; s/p PTCA with Dr.Dew. bil Arterial Dopp-wnl. STABLE.  #DISPOSITION: [name] today;  # follow up in 3 weeks -MD labs-cbc/cmp; Keytruda/- Dr.B     All questions were answered. The patient knows to call the clinic with any problems, questions or concerns.    Cammie Sickle, MD 01/26/2020 9:13 AM

## 2020-01-26 NOTE — Assessment & Plan Note (Addendum)
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  May 2021 C/A/P-stable; Slight ureteral thickening/small pelvic/retroperitoneal lymph nodes.  No lesions in the liver. STABLE  # Proceed with [place] today; Labs today reviewed;  acceptable for treatment today.  # Iron deficiency anemia-hemoglobin 13-improved. STABLE.   # Bilateral LE ulcers-s/p wound care evaluation; s/p PTCA with Dr.Dew. bil Arterial Dopp-wnl. STABLE.  #DISPOSITION: Chad [name] today;  # follow up in 3 weeks -MD labs-cbc/cmp; Keytruda/- Dr.B

## 2020-02-01 ENCOUNTER — Encounter (INDEPENDENT_AMBULATORY_CARE_PROVIDER_SITE_OTHER): Payer: Self-pay | Admitting: Nurse Practitioner

## 2020-02-01 ENCOUNTER — Other Ambulatory Visit: Payer: Self-pay

## 2020-02-01 ENCOUNTER — Ambulatory Visit (INDEPENDENT_AMBULATORY_CARE_PROVIDER_SITE_OTHER): Payer: Medicaid Other | Admitting: Nurse Practitioner

## 2020-02-01 ENCOUNTER — Ambulatory Visit (INDEPENDENT_AMBULATORY_CARE_PROVIDER_SITE_OTHER): Payer: Medicaid Other

## 2020-02-01 ENCOUNTER — Encounter: Payer: Self-pay | Admitting: Nurse Practitioner

## 2020-02-01 ENCOUNTER — Non-Acute Institutional Stay: Payer: Medicaid Other | Admitting: Nurse Practitioner

## 2020-02-01 VITALS — BP 139/73 | HR 87 | Resp 16 | Wt 178.0 lb

## 2020-02-01 DIAGNOSIS — Z515 Encounter for palliative care: Secondary | ICD-10-CM

## 2020-02-01 DIAGNOSIS — E43 Unspecified severe protein-calorie malnutrition: Secondary | ICD-10-CM

## 2020-02-01 DIAGNOSIS — I739 Peripheral vascular disease, unspecified: Secondary | ICD-10-CM | POA: Diagnosis not present

## 2020-02-01 DIAGNOSIS — L97909 Non-pressure chronic ulcer of unspecified part of unspecified lower leg with unspecified severity: Secondary | ICD-10-CM | POA: Diagnosis not present

## 2020-02-01 DIAGNOSIS — C661 Malignant neoplasm of right ureter: Secondary | ICD-10-CM

## 2020-02-01 NOTE — Progress Notes (Signed)
[facility] Consult Note Telephone: [phone]  Fax: [phone]  PATIENT NAME: Chad [name] DOB: [birth year] MRN: [id number]  PROVIDER:Dr Hodges/[place] and PLAN: 1.ACP: DNR; treat what is treatable including hospitalizations if necessary  2.Anorexia secondary to protein calorie malnutritionrelated to cancerimprovingongoing.Continue to monitor daily weights, supplements, supportive measures and courage to eat  3.Chronic pain monitor on pain scale, monitor efficacy vs adverse side effects. Currently takingscheduled gabapentin; tylenol in addition to oxycodone 10mg  q6hrs/not at maximum usage; will continue to monitor; encouraged to ask when in pain, Continue to follow by psychotherapy and psychiatry for counseling; supportive services.  4.Palliative care encounter;Palliative medicine team will continue to support patient, patient's family, and medical team. Visit consisted of counseling and education dealing with the complex and emotionally intense issues of symptom management and palliative care in the setting of serious and potentially life-threatening illness  I spent 60 minutes providing this consultation, Start 10:30am. More than 50% of the time in this consultation was spent coordinating communication.   HISTORY OF PRESENT ILLNESS:  Chad [name] is a 59 y.o. year old male with multiple medical problems including lateonset CVA, urothelial cancer, prostate cancer, chronic hepatitis C, hyperlipidemia, colon surgery, protein calorie malnutrition, anxiety, depression.Chad. Chad [name] continues to reside in [place] at [facility]. Chad. Chad [name] does transfer to the wheelchair where he is mobile and able to get around. Chad Chad [name] does performance adl's, toilets and self. Chad Chad Chad [name] does feed himself with appetite  continues to be fair to good depending on what is being served. Last weight 153.6 lbs in 09/2019 with no current facility weight, last wait at the [facility] 177.0 pounds on 7 / 1 / 2021. Chad. Chad [name] is on a regular diet, regular texture, regular liquid consistency. Chad. Chad [name] is able to verbalize his needs. Staff endorses no new changes our concerns. He continues to be followed by wound care at [facility] for wounds to left mal medial and right foot with last day of service 6 / 20 / 2020. He is also followed by Psychiatry with last day of service 5 / 27 / 2021 for major depressive episodes, mood disorder prescribed Escitalopram and Remeron with no medication changes. No other changes per staff. At present Chad. Tooker is sitting in the wheelchair. No visitors present. Chad Voong does appear comfortable as he has been trying to get out to the smoking patio. We talked about purpose of palliative care visit and Chad Skoda in agreement. We talked about any concerns or complaints. Chad Mccombie endorse is that he has concerns that he can't get his smoking pass. We talked about smoking cessation. Chad Mazzoni endorses that he has not had the ability to smoke in a few weeks. Chad Saxer also endorses that he would like to have his money from the facility. Encourage Chad. [place]len to further discuss the concerns of the smoking pass and his money with social worker at the facility. We talked about how he was feeling overall. Chad Betzold endorse is that he seems to be doing okay. He continues to go to the cancer center. Chad Lybrand endorses that he is appointments later this afternoon. We talked about his functional-level. We talked about his appetite. We talked about edema lower extremities, importance of mobility. We talked about medical goals of care. We talked about treat what is treatable. We talked about challenges with residing at a facility and relying on others  for needs. We talked about coping strategies. We talked  about role of palliative care and plan of care. Most of visit has been supportive, emotional support provided, therapeutic listening. Who talked about follow-up visit in 2 months if needed or sooner said he declined. Chad Northup in agreement. I updated nursing staff. Chad Brandenburg endorses he is thankful for palliative care visit.   Oncology: ASSESSMENT & PLAN:   Ureteral cancer, right ([place]) # High-grade urothelial cancer/cytology; likely of the right renal pelvis /upper ureter.  May 2021 C/A/P-stable; Slight ureteral thickening/small pelvic/retroperitoneal lymph nodes.  No lesions in the liver. STABLE  # Proceed with [place] [name]; Labs [name] reviewed;  acceptable for treatment [name].  # Iron deficiency anemia-hemoglobin 13-improved. STABLE.   # Bilateral LE ulcers-s/p wound care evaluation; s/p PTCA with Dr.Dew. bil Arterial Dopp-wnl. STABLE.  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; # Colon ca- stage III  GOALS: palliative  CURRENT/MOST RECENT THERAPY: Keytruda [C]    Urothelial cancer ([place])    Initial Diagnosis    Urothelial cancer ([place])    Ureteral cancer, right ([place])   05/26/2018 Initial Diagnosis    Ureteral cancer, right ([place])    06/21/2018 -  Chemotherapy    The patient had pembrolizumab (KEYTRUDA) 200 mg in sodium chloride 0.9 % 50 mL chemo infusion, 200 mg, Intravenous, Once, 21 of 25 cycles Administration: 200 mg (06/21/2018), 200 mg (07/12/2018), 200 mg (08/02/2018), 200 mg  (08/23/2018), 200 mg (09/13/2018), 200 mg (10/04/2018), 200 mg (10/25/2018), 200 mg (11/22/2018), 200 mg (12/13/2018), 200 mg (01/05/2019), 200 mg (01/24/2019), 200 mg (02/14/2019), 200 mg (03/11/2019), 200 mg (06/17/2019), 200 mg (08/26/2019), 200 mg (09/16/2019), 200 mg (10/07/2019), 200 mg (10/28/2019), 200 mg (11/24/2019), 200 mg (12/15/2019), 200 mg (01/05/2020)  for chemotherapy treatment.      Palliative Care was asked to help to continue to address goals of care.   CODE STATUS: DNR  PPS: 50% HOSPICE ELIGIBILITY/DIAGNOSIS: TBD  PAST MEDICAL HISTORY:  Past Medical History:  Diagnosis Date  . Anemia   . Anxiety   . ARF (acute respiratory failure) ([place])   . Bladder cancer ([place])   . Cancer [facility])    prostate  . COPD (chronic obstructive pulmonary disease) ([place])   . Depression   . Dysphagia   . Family history of colon cancer   . Family history of kidney cancer   . Family history of leukemia   . Family history of prostate cancer   . GERD (gastroesophageal reflux disease)   . Hepatitis    chronic hep c  . Hydronephrosis   . Hydronephrosis with ureteral stricture   . Hyperlipidemia   . Knee pain    Left  . Malignant neoplasm of colon ([place])   . Nerve pain   . Peripheral vascular disease ([place])   . Stroke ([place])   . Urinary frequency   . Venous hypertension of both lower extremities     SOCIAL HX:  Social History   Tobacco Use  . Smoking status: Current Every Day Smoker  Packs/day: 1.00    Types: Cigarettes  . Smokeless tobacco: Never Used  Substance Use Topics  . Alcohol use: Not Currently    ALLERGIES:  Allergies  Allergen Reactions  . Penicillins Rash     PERTINENT MEDICATIONS:  Outpatient Encounter Medications as of 02/01/2020  Medication Sig  . acetaminophen (TYLENOL) 325 MG tablet Take 650 mg by mouth 3 (three) times daily.   Marland Kitchen aspirin EC 81 MG tablet Take 1 tablet (81 mg total) by mouth daily.  Marland Kitchen atorvastatin (LIPITOR) 10 MG tablet Take 1 tablet (10 mg total) by mouth  daily.  . clopidogrel (PLAVIX) 75 MG tablet Take 1 tablet (75 mg total) by mouth daily.  . collagenase (SANTYL) ointment Apply 1 application topically daily. LEFT LOWER LEG  . cyclobenzaprine (FLEXERIL) 10 MG tablet Take 10 mg by mouth at bedtime.  . diclofenac sodium (VOLTAREN) 1 % GEL Apply 2 g topically See admin instructions. Apply 2 g topically 4 times daily. Apply 2 g to left shoulder, apply 2 g to left hip and 2 g to left knee 4 times daily per [facility] instructions  . escitalopram (LEXAPRO) 5 MG tablet Take 5 mg by mouth daily.   Marland Kitchen gabapentin (NEURONTIN) 100 MG capsule Take 100 mg by mouth 3 (three) times daily.  . mirtazapine (REMERON) 7.5 MG tablet Take 7.5 mg by mouth at bedtime.  Marland Kitchen oxycodone (OXY-IR) 5 MG capsule Take 10 mg by mouth every 6 (six) hours as needed for pain.   . pantoprazole (PROTONIX) 40 MG tablet Take 40 mg by mouth daily.   No facility-administered encounter medications on file as of 02/01/2020.    PHYSICAL EXAM:   General: NAD, debilitated pleasant male Cardiovascular: regular rate and rhythm Pulmonary: clear ant fields Extremities: +BLE edema, no joint deformities Neurological: w/c dependent  Jonay Hitchcock Ihor Gully, NP

## 2020-02-01 NOTE — Progress Notes (Signed)
Subjective:    Patient ID: Chad [name], male    DOB: [birth year], 59 y.o.   MRN: [id number] Chief Complaint  Patient presents with  . Follow-up    ultrasound follow up    The patient is seen for evaluation of painful lower extremities and diminished pulses associated with ulceration of the foot.  The patient notes the ulcer has been present for multiple weeks and has not been improving the right lower extremity wound has been ongoing for several months but the left has been present for longer than 6 months.  It is very painful and has had some drainage.  No specific history of trauma noted by the patient.  The patient denies fever or chills.  the patient does have diabetes which has been difficult to control.  The patient had angiograms to the right lower extremity on 05/30/2019 and to the left on 05/23/2019.  The right lower extremity wounds have mostly healed since that time.  The current wounds are new wounds.  The patient is not extremely ambulatory so it is difficult to determine claudication-like symptoms.  The pain has been progressive over the past several years.   The patient denies rest pain or dangling of an extremity off the side of the bed during the night for relief. No prior interventions or surgeries.  No history of back problems or DJD of the lumbar sacral spine.   The patient denies amaurosis fugax or recent TIA symptoms. There are no recent neurological changes noted. The patient denies history of DVT, PE or superficial thrombophlebitis. The patient denies recent episodes of angina or shortness of breath.   Today noninvasive studies show a right ABI of 1.00 with a TBI of 0.37.  The left has an ABI of 1.20 with a TBI 0.75.  The previous ABI was 0.88 on the right with a TBI 0.71.  The left had an ABI of 1.09 with a TBI of 0.70.  The patient also underwent a bilateral lower extremity arterial duplex which revealed mostly triphasic and biphasic waveforms in the left lower  extremity except for monophasic waveforms in the posterior tibial artery.  The right lower extremity has biphasic waveforms but monophasic waveforms in the tibial arteries.  The toe waveforms on the right lower extremity are severely dampened and nearly flat.   Review of Systems  Skin: Positive for wound.  Neurological: Positive for weakness.  All other systems reviewed and are negative.      Objective:   Physical Exam Vitals reviewed.  HENT:     Head: Normocephalic.  Cardiovascular:     Rate and Rhythm: Normal rate and regular rhythm.     Pulses: Normal pulses.     Heart sounds: Normal heart sounds.  Pulmonary:     Effort: Pulmonary effort is normal.     Breath sounds: Normal breath sounds.  Musculoskeletal:       Feet:  Feet:     Right foot:     Skin integrity: Ulcer present.     Left foot:     Skin integrity: Ulcer present.  Neurological:     Mental Status: He is alert and oriented to person, place, and time.     Motor: Weakness present.  Psychiatric:        Mood and Affect: Mood normal.        Behavior: Behavior normal.        Thought Content: Thought content normal.        Judgment: Judgment normal.  BP 139/73 (BP Location: Right Arm)   Pulse 87   Resp 16   Wt 178 lb (80.7 kg)   BMI 27.88 kg/m   Past Medical History:  Diagnosis Date  . Anemia   . Anxiety   . ARF (acute respiratory failure) ([place])   . Bladder cancer ([place])   . Cancer [facility])    prostate  . COPD (chronic obstructive pulmonary disease) ([place])   . Depression   . Dysphagia   . Family history of colon cancer   . Family history of kidney cancer   . Family history of leukemia   . Family history of prostate cancer   . GERD (gastroesophageal reflux disease)   . Hepatitis    chronic hep c  . Hydronephrosis   . Hydronephrosis with ureteral stricture   . Hyperlipidemia   . Knee pain    Left  . Malignant neoplasm of colon ([place])   . Nerve pain   . Peripheral vascular disease ([place])   .  Stroke ([place])   . Urinary frequency   . Venous hypertension of both lower extremities     Social History   Socioeconomic History  . Marital status: Single    Spouse name: Not on file  . Number of children: Not on file  . Years of education: Not on file  . Highest education level: Not on file  Occupational History  . Not on file  Tobacco Use  . Smoking status: Current Every Day Smoker    Packs/day: 1.00    Types: Cigarettes  . Smokeless tobacco: Never Used  Vaping Use  . Vaping Use: Never used  Substance and Sexual Activity  . Alcohol use: Not Currently  . Drug use: Not Currently  . Sexual activity: Not on file  Other Topics Concern  . Not on file  Social History Narrative    used to live [place]; moved  To [place]- end of April 2019; in Nursing home; 1pp/day; quit alcohol. 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        Family- dad- prostate ca [at 29y]; brother- 63 died of prostate cancer; brother- 55- no cancers [[place]]; sonGerald Stabs [[place]];[name]-32y prostate ca [facility] [place]]; daughter- 22 [[place]]; another daughter 37 [[place]/addict]. will refer genetics counseling. Given MSI- abnormal; highly suspicious of Lynch syndrome.  Patient's son [name] aware of high possible lynch syndrome.   Social Determinants of Health   Financial Resource Strain:   . Difficulty of Paying Living Expenses:   Food Insecurity:   . Worried About Charity fundraiser in the Last Year:   . Arboriculturist in the Last Year:   Transportation Needs:   . Film/video editor (Medical):   Marland Kitchen Lack of Transportation (Non-Medical):   Physical Activity:   . Days of Exercise per Week:   . Minutes of Exercise per Session:   Stress:   . Feeling of Stress :   Social Connections:   . Frequency of Communication with Friends and Family:   . Frequency of Social Gatherings with Friends and Family:   . Attends Religious Services:   . Active Member of Clubs or Organizations:   . Attends Archivist Meetings:   Marland Kitchen  Marital Status:   Intimate Partner Violence:   . Fear of Current or Ex-Partner:   . Emotionally Abused:   Marland Kitchen Physically Abused:   . Sexually Abused:     Past Surgical History:  Procedure Laterality Date  . COLON SURGERY  En bloc extended right hemicolectomy 07/2017  . CYSTOSCOPY W/ RETROGRADES Right 08/30/2018   Procedure: CYSTOSCOPY WITH RETROGRADE PYELOGRAM;  Surgeon: Hollice Espy, MD;  Location: [facility];  Service: Urology;  Laterality: Right;  . CYSTOSCOPY WITH STENT PLACEMENT Right 04/25/2018   Procedure: CYSTOSCOPY WITH STENT PLACEMENT;  Surgeon: Hollice Espy, MD;  Location: [facility];  Service: Urology;  Laterality: Right;  . CYSTOSCOPY WITH STENT PLACEMENT Right 08/30/2018   Procedure: [place] WITH STENT Exchange;  Surgeon: Hollice Espy, MD;  Location: [facility];  Service: Urology;  Laterality: Right;  . CYSTOSCOPY WITH STENT PLACEMENT Right 03/07/2019   Procedure: CYSTOSCOPY WITH STENT Exchange;  Surgeon: Hollice Espy, MD;  Location: [facility];  Service: Urology;  Laterality: Right;  . CYSTOSCOPY WITH STENT PLACEMENT Right 11/21/2019   Procedure: CYSTOSCOPY WITH STENT Exchange;  Surgeon: Hollice Espy, MD;  Location: [facility];  Service: Urology;  Laterality: Right;  . LOWER EXTREMITY ANGIOGRAPHY Left 05/23/2019   Procedure: LOWER EXTREMITY ANGIOGRAPHY;  Surgeon: Algernon Huxley, MD;  Location: [place] [facility];  Service: Cardiovascular;  Laterality: Left;  . LOWER EXTREMITY ANGIOGRAPHY Right 05/30/2019   Procedure: LOWER EXTREMITY ANGIOGRAPHY;  Surgeon: Algernon Huxley, MD;  Location: [place] [facility];  Service: Cardiovascular;  Laterality: Right;  . PORTA CATH INSERTION N/A 02/28/2019   Procedure: PORTA CATH INSERTION;  Surgeon: Algernon Huxley, MD;  Location: [place] [facility];  Service: Cardiovascular;  Laterality: N/A;  . tumor removed       Family History  Problem Relation Age of Onset  . Prostate cancer Father 72  . Cancer Brother 48       unsure type  .  Cancer Paternal Uncle        unsure type  . Cancer Maternal Grandmother        unsure type  . Cancer Paternal Grandmother        unsure type  . Kidney cancer Paternal Grandfather   . Cancer Other        unsure types  . Leukemia Son   . Cancer Son        other cancers, possibly colon    Allergies  Allergen Reactions  . Penicillins Rash       Assessment & Plan:   1. 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  This represents a limb threatening ischemia and places the patient at the risk for limb loss.  Patient should undergo angiography of the right lower extremity followed by the left. with the hope for intervention for limb salvage.  The risks and benefits as well as the alternative therapies was discussed in detail with the patient.  All questions were answered.  Patient agrees to proceed with angiography.  The patient will follow up with me in the office after the procedure.    2. Protein-calorie malnutrition, severe This may also play a part in the patient's delayed wound healing.  Nursing facility encouraged to provide more protein in the patient's diet.    Current Outpatient Medications on File Prior to Visit  Medication Sig Dispense Refill  . acetaminophen (TYLENOL) 325 MG tablet Take 650 mg by mouth 3 (three) times daily.     Marland Kitchen aspirin EC 81 MG tablet Take 1 tablet (81 mg total) by mouth daily. 150 tablet 2  . atorvastatin (LIPITOR) 10 MG tablet Take  1 tablet (10 mg total) by mouth daily. 30 tablet 11  . clopidogrel (PLAVIX) 75 MG tablet Take 1 tablet (75 mg total) by mouth daily. 30 tablet 11  . collagenase (SANTYL) ointment Apply 1 application topically daily. LEFT LOWER LEG    . cyclobenzaprine (FLEXERIL) 10 MG tablet Take 10 mg by mouth at bedtime.    . diclofenac sodium (VOLTAREN) 1  % GEL Apply 2 g topically See admin instructions. Apply 2 g topically 4 times daily. Apply 2 g to left shoulder, apply 2 g to left hip and 2 g to left knee 4 times daily per [facility] instructions    . escitalopram (LEXAPRO) 5 MG tablet Take 5 mg by mouth daily.     Marland Kitchen gabapentin (NEURONTIN) 100 MG capsule Take 100 mg by mouth 3 (three) times daily.    . mirtazapine (REMERON) 7.5 MG tablet Take 7.5 mg by mouth at bedtime.    Marland Kitchen oxycodone (OXY-IR) 5 MG capsule Take 10 mg by mouth every 6 (six) hours as needed for pain.     . pantoprazole (PROTONIX) 40 MG tablet Take 40 mg by mouth daily.     No current facility-administered medications on file prior to visit.    There are no Patient Instructions on file for this visit. No follow-ups on file.   Kris Hartmann, NP

## 2020-02-01 NOTE — H&P (View-Only) (Signed)
Subjective:    Patient ID: Chad [name], male    DOB: 09-14-1960, 59 y.o.   MRN: [id number] Chief Complaint  Patient presents with  . Follow-up    ultrasound follow up    The patient is seen for evaluation of painful lower extremities and diminished pulses associated with ulceration of the foot.  The patient notes the ulcer has been present for multiple weeks and has not been improving the right lower extremity wound has been ongoing for several months but the left has been present for longer than 6 months.  It is very painful and has had some drainage.  No specific history of trauma noted by the patient.  The patient denies fever or chills.  the patient does have diabetes which has been difficult to control.  The patient had angiograms to the right lower extremity on 05/30/2019 and to the left on 05/23/2019.  The right lower extremity wounds have mostly healed since that time.  The current wounds are new wounds.  The patient is not extremely ambulatory so it is difficult to determine claudication-like symptoms.  The pain has been progressive over the past several years.   The patient denies rest pain or dangling of an extremity off the side of the bed during the night for relief. No prior interventions or surgeries.  No history of back problems or DJD of the lumbar sacral spine.   The patient denies amaurosis fugax or recent TIA symptoms. There are no recent neurological changes noted. The patient denies history of DVT, PE or superficial thrombophlebitis. The patient denies recent episodes of angina or shortness of breath.   Today noninvasive studies show a right ABI of 1.00 with a TBI of 0.37.  The left has an ABI of 1.20 with a TBI 0.75.  The previous ABI was 0.88 on the right with a TBI 0.71.  The left had an ABI of 1.09 with a TBI of 0.70.  The patient also underwent a bilateral lower extremity arterial duplex which revealed mostly triphasic and biphasic waveforms in the left lower  extremity except for monophasic waveforms in the posterior tibial artery.  The right lower extremity has biphasic waveforms but monophasic waveforms in the tibial arteries.  The toe waveforms on the right lower extremity are severely dampened and nearly flat.   Review of Systems  Skin: Positive for wound.  Neurological: Positive for weakness.  All other systems reviewed and are negative.      Objective:   Physical Exam Vitals reviewed.  HENT:     Head: Normocephalic.  Cardiovascular:     Rate and Rhythm: Normal rate and regular rhythm.     Pulses: Normal pulses.     Heart sounds: Normal heart sounds.  Pulmonary:     Effort: Pulmonary effort is normal.     Breath sounds: Normal breath sounds.  Musculoskeletal:       Feet:  Feet:     Right foot:     Skin integrity: Ulcer present.     Left foot:     Skin integrity: Ulcer present.  Neurological:     Mental Status: He is alert and oriented to person, place, and time.     Motor: Weakness present.  Psychiatric:        Mood and Affect: Mood normal.        Behavior: Behavior normal.        Thought Content: Thought content normal.        Judgment: Judgment normal.  BP 139/73 (BP Location: Right Arm)   Pulse 87   Resp 16   Wt 178 lb (80.7 kg)   BMI 27.88 kg/m   Past Medical History:  Diagnosis Date  . Anemia   . Anxiety   . ARF (acute respiratory failure) ([place])   . Bladder cancer ([place])   . Cancer [facility])    prostate  . COPD (chronic obstructive pulmonary disease) ([place])   . Depression   . Dysphagia   . Family history of colon cancer   . Family history of kidney cancer   . Family history of leukemia   . Family history of prostate cancer   . GERD (gastroesophageal reflux disease)   . Hepatitis    chronic hep c  . Hydronephrosis   . Hydronephrosis with ureteral stricture   . Hyperlipidemia   . Knee pain    Left  . Malignant neoplasm of colon ([place])   . Nerve pain   . Peripheral vascular disease ([place])   .  Stroke ([place])   . Urinary frequency   . Venous hypertension of both lower extremities     Social History   Socioeconomic History  . Marital status: Single    Spouse name: Not on file  . Number of children: Not on file  . Years of education: Not on file  . Highest education level: Not on file  Occupational History  . Not on file  Tobacco Use  . Smoking status: Current Every Day Smoker    Packs/day: 1.00    Types: Cigarettes  . Smokeless tobacco: Never Used  Vaping Use  . Vaping Use: Never used  Substance and Sexual Activity  . Alcohol use: Not Currently  . Drug use: Not Currently  . Sexual activity: Not on file  Other Topics Concern  . Not on file  Social History Narrative    used to live [place]; moved  To [place]- end of April 2019; in Nursing home; 1pp/day; quit alcohol. 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        Family- dad- prostate ca [at 25y]; brother- 5 died of prostate cancer; brother- 20- no cancers [[place]]; sonGerald [name] [[place]];Chad [name]-32y prostate ca [facility] [place]]; Chad [name]- 69 [[place]]; Chad [name] 75 [[place]/addict]. will refer genetics counseling. Given MSI- abnormal; highly suspicious of Lynch syndrome.  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   Social Determinants of Health   Financial Resource Strain:   . Difficulty of Paying Living Expenses:   Food Insecurity:   . Worried About Charity fundraiser in the Last Year:   . Arboriculturist in the Last Year:   Transportation Needs:   . Film/video editor (Medical):   Marland Kitchen Lack of Transportation (Non-Medical):   Physical Activity:   . Days of Exercise per Week:   . Minutes of Exercise per Session:   Stress:   . Feeling of Stress :   Social Connections:   . Frequency of Communication with Friends and Family:   . Frequency of Social Gatherings with Friends and Family:   . Attends Religious Services:   . Active Member of Clubs or Organizations:   . Attends Archivist Meetings:   Marland Kitchen  Marital Status:   Intimate Partner Violence:   . Fear of Current or Ex-Partner:   . Emotionally Abused:   Marland Kitchen Physically Abused:   . Sexually Abused:     Past Surgical History:  Procedure Laterality Date  . COLON SURGERY  En bloc extended right hemicolectomy 07/2017  . CYSTOSCOPY W/ RETROGRADES Right 08/30/2018   Procedure: CYSTOSCOPY WITH RETROGRADE PYELOGRAM;  Surgeon: Hollice Espy, MD;  Location: [facility];  Service: Urology;  Laterality: Right;  . CYSTOSCOPY WITH STENT PLACEMENT Right 04/25/2018   Procedure: CYSTOSCOPY WITH STENT PLACEMENT;  Surgeon: Hollice Espy, MD;  Location: [facility];  Service: Urology;  Laterality: Right;  . CYSTOSCOPY WITH STENT PLACEMENT Right 08/30/2018   Procedure: [place] WITH STENT Exchange;  Surgeon: Hollice Espy, MD;  Location: [facility];  Service: Urology;  Laterality: Right;  . CYSTOSCOPY WITH STENT PLACEMENT Right 03/07/2019   Procedure: CYSTOSCOPY WITH STENT Exchange;  Surgeon: Hollice Espy, MD;  Location: [facility];  Service: Urology;  Laterality: Right;  . CYSTOSCOPY WITH STENT PLACEMENT Right 11/21/2019   Procedure: CYSTOSCOPY WITH STENT Exchange;  Surgeon: Hollice Espy, MD;  Location: [facility];  Service: Urology;  Laterality: Right;  . LOWER EXTREMITY ANGIOGRAPHY Left 05/23/2019   Procedure: LOWER EXTREMITY ANGIOGRAPHY;  Surgeon: Algernon Huxley, MD;  Location: [place] [facility];  Service: Cardiovascular;  Laterality: Left;  . LOWER EXTREMITY ANGIOGRAPHY Right 05/30/2019   Procedure: LOWER EXTREMITY ANGIOGRAPHY;  Surgeon: Algernon Huxley, MD;  Location: [place] [facility];  Service: Cardiovascular;  Laterality: Right;  . PORTA CATH INSERTION N/A 02/28/2019   Procedure: PORTA CATH INSERTION;  Surgeon: Algernon Huxley, MD;  Location: [place] [facility];  Service: Cardiovascular;  Laterality: N/A;  . tumor removed       Family History  Problem Relation Age of Onset  . Prostate cancer Father 73  . Cancer Brother 39       unsure type  .  Cancer Paternal Uncle        unsure type  . Cancer Maternal Grandmother        unsure type  . Cancer Paternal Grandmother        unsure type  . Kidney cancer Paternal Grandfather   . Cancer Other        unsure types  . Leukemia Son   . Cancer Son        other cancers, possibly colon    Allergies  Allergen Reactions  . Penicillins Rash       Assessment & Plan:   1. 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  This represents a limb threatening ischemia and places the patient at the risk for limb loss.  Patient should undergo angiography of the right lower extremity followed by the left. with the hope for intervention for limb salvage.  The risks and benefits as well as the alternative therapies was discussed in detail with the patient.  All questions were answered.  Patient agrees to proceed with angiography.  The patient will follow up with me in the office after the procedure.    2. Protein-calorie malnutrition, severe This may also play a part in the patient's delayed wound healing.  Nursing facility encouraged to provide more protein in the patient's diet.    Current Outpatient Medications on File Prior to Visit  Medication Sig Dispense Refill  . acetaminophen (TYLENOL) 325 MG tablet Take 650 mg by mouth 3 (three) times daily.     Marland Kitchen aspirin EC 81 MG tablet Take 1 tablet (81 mg total) by mouth daily. 150 tablet 2  . atorvastatin (LIPITOR) 10 MG tablet Take  1 tablet (10 mg total) by mouth daily. 30 tablet 11  . clopidogrel (PLAVIX) 75 MG tablet Take 1 tablet (75 mg total) by mouth daily. 30 tablet 11  . collagenase (SANTYL) ointment Apply 1 application topically daily. LEFT LOWER LEG    . cyclobenzaprine (FLEXERIL) 10 MG tablet Take 10 mg by mouth at bedtime.    . diclofenac sodium (VOLTAREN) 1  % GEL Apply 2 g topically See admin instructions. Apply 2 g topically 4 times daily. Apply 2 g to left shoulder, apply 2 g to left hip and 2 g to left knee 4 times daily per [facility] instructions    . escitalopram (LEXAPRO) 5 MG tablet Take 5 mg by mouth daily.     Marland Kitchen gabapentin (NEURONTIN) 100 MG capsule Take 100 mg by mouth 3 (three) times daily.    . mirtazapine (REMERON) 7.5 MG tablet Take 7.5 mg by mouth at bedtime.    Marland Kitchen oxycodone (OXY-IR) 5 MG capsule Take 10 mg by mouth every 6 (six) hours as needed for pain.     . pantoprazole (PROTONIX) 40 MG tablet Take 40 mg by mouth daily.     No current facility-administered medications on file prior to visit.    There are no Patient Instructions on file for this visit. No follow-ups on file.   Kris Hartmann, NP

## 2020-02-01 NOTE — H&P (View-Only) (Signed)
Subjective:    Patient ID: Chad [name], male    DOB: Nov 08, 1960, 59 y.o.   MRN: [id number] Chief Complaint  Patient presents with  . Follow-up    ultrasound follow up    The patient is seen for evaluation of painful lower extremities and diminished pulses associated with ulceration of the foot.  The patient notes the ulcer has been present for multiple weeks and has not been improving the right lower extremity wound has been ongoing for several months but the left has been present for longer than 6 months.  It is very painful and has had some drainage.  No specific history of trauma noted by the patient.  The patient denies fever or chills.  the patient does have diabetes which has been difficult to control.  The patient had angiograms to the right lower extremity on 05/30/2019 and to the left on 05/23/2019.  The right lower extremity wounds have mostly healed since that time.  The current wounds are new wounds.  The patient is not extremely ambulatory so it is difficult to determine claudication-like symptoms.  The pain has been progressive over the past several years.   The patient denies rest pain or dangling of an extremity off the side of the bed during the night for relief. No prior interventions or surgeries.  No history of back problems or DJD of the lumbar sacral spine.   The patient denies amaurosis fugax or recent TIA symptoms. There are no recent neurological changes noted. The patient denies history of DVT, PE or superficial thrombophlebitis. The patient denies recent episodes of angina or shortness of breath.   Today noninvasive studies show a right ABI of 1.00 with a TBI of 0.37.  The left has an ABI of 1.20 with a TBI 0.75.  The previous ABI was 0.88 on the right with a TBI 0.71.  The left had an ABI of 1.09 with a TBI of 0.70.  The patient also underwent a bilateral lower extremity arterial duplex which revealed mostly triphasic and biphasic waveforms in the left lower  extremity except for monophasic waveforms in the posterior tibial artery.  The right lower extremity has biphasic waveforms but monophasic waveforms in the tibial arteries.  The toe waveforms on the right lower extremity are severely dampened and nearly flat.   Review of Systems  Skin: Positive for wound.  Neurological: Positive for weakness.  All other systems reviewed and are negative.      Objective:   Physical Exam Vitals reviewed.  HENT:     Head: Normocephalic.  Cardiovascular:     Rate and Rhythm: Normal rate and regular rhythm.     Pulses: Normal pulses.     Heart sounds: Normal heart sounds.  Pulmonary:     Effort: Pulmonary effort is normal.     Breath sounds: Normal breath sounds.  Musculoskeletal:       Feet:  Feet:     Right foot:     Skin integrity: Ulcer present.     Left foot:     Skin integrity: Ulcer present.  Neurological:     Mental Status: He is alert and oriented to person, place, and time.     Motor: Weakness present.  Psychiatric:        Mood and Affect: Mood normal.        Behavior: Behavior normal.        Thought Content: Thought content normal.        Judgment: Judgment normal.  BP 139/73 (BP Location: Right Arm)   Pulse 87   Resp 16   Wt 178 lb (80.7 kg)   BMI 27.88 kg/m   Past Medical History:  Diagnosis Date  . Anemia   . Anxiety   . ARF (acute respiratory failure) ([place])   . Bladder cancer ([place])   . Cancer [facility])    prostate  . COPD (chronic obstructive pulmonary disease) ([place])   . Depression   . Dysphagia   . Family history of colon cancer   . Family history of kidney cancer   . Family history of leukemia   . Family history of prostate cancer   . GERD (gastroesophageal reflux disease)   . Hepatitis    chronic hep c  . Hydronephrosis   . Hydronephrosis with ureteral stricture   . Hyperlipidemia   . Knee pain    Left  . Malignant neoplasm of colon ([place])   . Nerve pain   . Peripheral vascular disease ([place])   .  Stroke ([place])   . Urinary frequency   . Venous hypertension of both lower extremities     Social History   Socioeconomic History  . Marital status: Single    Spouse name: Not on file  . Number of children: Not on file  . Years of education: Not on file  . Highest education level: Not on file  Occupational History  . Not on file  Tobacco Use  . Smoking status: Current Every Day Smoker    Packs/day: 1.00    Types: Cigarettes  . Smokeless tobacco: Never Used  Vaping Use  . Vaping Use: Never used  Substance and Sexual Activity  . Alcohol use: Not Currently  . Drug use: Not Currently  . Sexual activity: Not on file  Other Topics Concern  . Not on file  Social History Narrative    used to live [place]; moved  To [place]- end of April 2019; in Nursing home; 1pp/day; quit alcohol. 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        Family- dad- prostate ca [at 19y]; brother- 9 died of prostate cancer; brother- 36- no cancers [[place]]; sonGerald [name] [[place]];Chad [name]-32y prostate ca [facility] [place]]; daughter- 48 [[place]]; another daughter 78 [[place]/addict]. will refer genetics counseling. Given MSI- abnormal; highly suspicious of Lynch syndrome.  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   Social Determinants of Health   Financial Resource Strain:   . Difficulty of Paying Living Expenses:   Food Insecurity:   . Worried About Charity fundraiser in the Last Year:   . Arboriculturist in the Last Year:   Transportation Needs:   . Film/video editor (Medical):   Marland Kitchen Lack of Transportation (Non-Medical):   Physical Activity:   . Days of Exercise per Week:   . Minutes of Exercise per Session:   Stress:   . Feeling of Stress :   Social Connections:   . Frequency of Communication with Friends and Family:   . Frequency of Social Gatherings with Friends and Family:   . Attends Religious Services:   . Active Member of Clubs or Organizations:   . Attends Archivist Meetings:   Marland Kitchen  Marital Status:   Intimate Partner Violence:   . Fear of Current or Ex-Partner:   . Emotionally Abused:   Marland Kitchen Physically Abused:   . Sexually Abused:     Past Surgical History:  Procedure Laterality Date  . COLON SURGERY  En bloc extended right hemicolectomy 07/2017  . CYSTOSCOPY W/ RETROGRADES Right 08/30/2018   Procedure: CYSTOSCOPY WITH RETROGRADE PYELOGRAM;  Surgeon: Hollice Espy, MD;  Location: [facility];  Service: Urology;  Laterality: Right;  . CYSTOSCOPY WITH STENT PLACEMENT Right 04/25/2018   Procedure: CYSTOSCOPY WITH STENT PLACEMENT;  Surgeon: Hollice Espy, MD;  Location: [facility];  Service: Urology;  Laterality: Right;  . CYSTOSCOPY WITH STENT PLACEMENT Right 08/30/2018   Procedure: [place] WITH STENT Exchange;  Surgeon: Hollice Espy, MD;  Location: [facility];  Service: Urology;  Laterality: Right;  . CYSTOSCOPY WITH STENT PLACEMENT Right 03/07/2019   Procedure: CYSTOSCOPY WITH STENT Exchange;  Surgeon: Hollice Espy, MD;  Location: [facility];  Service: Urology;  Laterality: Right;  . CYSTOSCOPY WITH STENT PLACEMENT Right 11/21/2019   Procedure: CYSTOSCOPY WITH STENT Exchange;  Surgeon: Hollice Espy, MD;  Location: [facility];  Service: Urology;  Laterality: Right;  . LOWER EXTREMITY ANGIOGRAPHY Left 05/23/2019   Procedure: LOWER EXTREMITY ANGIOGRAPHY;  Surgeon: Algernon Huxley, MD;  Location: [place] [facility];  Service: Cardiovascular;  Laterality: Left;  . LOWER EXTREMITY ANGIOGRAPHY Right 05/30/2019   Procedure: LOWER EXTREMITY ANGIOGRAPHY;  Surgeon: Algernon Huxley, MD;  Location: [place] [facility];  Service: Cardiovascular;  Laterality: Right;  . PORTA CATH INSERTION N/A 02/28/2019   Procedure: PORTA CATH INSERTION;  Surgeon: Algernon Huxley, MD;  Location: [place] [facility];  Service: Cardiovascular;  Laterality: N/A;  . tumor removed       Family History  Problem Relation Age of Onset  . Prostate cancer Father 66  . Cancer Brother 75       unsure type  .  Cancer Paternal Uncle        unsure type  . Cancer Maternal Grandmother        unsure type  . Cancer Paternal Grandmother        unsure type  . Kidney cancer Paternal Grandfather   . Cancer Other        unsure types  . Leukemia Son   . Cancer Son        other cancers, possibly colon    Allergies  Allergen Reactions  . Penicillins Rash       Assessment & Plan:   1. 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  This represents a limb threatening ischemia and places the patient at the risk for limb loss.  Patient should undergo angiography of the right lower extremity followed by the left. with the hope for intervention for limb salvage.  The risks and benefits as well as the alternative therapies was discussed in detail with the patient.  All questions were answered.  Patient agrees to proceed with angiography.  The patient will follow up with me in the office after the procedure.    2. Protein-calorie malnutrition, severe This may also play a part in the patient's delayed wound healing.  Nursing facility encouraged to provide more protein in the patient's diet.    Current Outpatient Medications on File Prior to Visit  Medication Sig Dispense Refill  . acetaminophen (TYLENOL) 325 MG tablet Take 650 mg by mouth 3 (three) times daily.     Marland Kitchen aspirin EC 81 MG tablet Take 1 tablet (81 mg total) by mouth daily. 150 tablet 2  . atorvastatin (LIPITOR) 10 MG tablet Take  1 tablet (10 mg total) by mouth daily. 30 tablet 11  . clopidogrel (PLAVIX) 75 MG tablet Take 1 tablet (75 mg total) by mouth daily. 30 tablet 11  . collagenase (SANTYL) ointment Apply 1 application topically daily. LEFT LOWER LEG    . cyclobenzaprine (FLEXERIL) 10 MG tablet Take 10 mg by mouth at bedtime.    . diclofenac sodium (VOLTAREN) 1  % GEL Apply 2 g topically See admin instructions. Apply 2 g topically 4 times daily. Apply 2 g to left shoulder, apply 2 g to left hip and 2 g to left knee 4 times daily per [facility] instructions    . escitalopram (LEXAPRO) 5 MG tablet Take 5 mg by mouth daily.     Marland Kitchen gabapentin (NEURONTIN) 100 MG capsule Take 100 mg by mouth 3 (three) times daily.    . mirtazapine (REMERON) 7.5 MG tablet Take 7.5 mg by mouth at bedtime.    Marland Kitchen oxycodone (OXY-IR) 5 MG capsule Take 10 mg by mouth every 6 (six) hours as needed for pain.     . pantoprazole (PROTONIX) 40 MG tablet Take 40 mg by mouth daily.     No current facility-administered medications on file prior to visit.    There are no Patient Instructions on file for this visit. No follow-ups on file.   Kris Hartmann, NP

## 2020-02-02 ENCOUNTER — Telehealth (INDEPENDENT_AMBULATORY_CARE_PROVIDER_SITE_OTHER): Payer: Self-pay

## 2020-02-02 NOTE — Telephone Encounter (Signed)
Spoke with Aniceto Boss at [facility] regarding the patient being scheduled for a bilateral leg angiograms with Dr. Lucky Cowboy. Patient has a RLE angio on 02/08/20 with a 8:45 am arrival time to the MM and covid testing on 02/06/20 between 8-1 pm at the [place]. Patient is scheduled for his LLE angio on 02/15/20 with a 8:00 am arrival time to the MM and covid testing on 02/13/20 between 8-1 pm at the [place]. Pre-procedure instructions will be faxed to [facility] at [facility].Marland Kitchen

## 2020-02-06 ENCOUNTER — Other Ambulatory Visit (INDEPENDENT_AMBULATORY_CARE_PROVIDER_SITE_OTHER): Payer: Self-pay | Admitting: Nurse Practitioner

## 2020-02-06 ENCOUNTER — Other Ambulatory Visit
Admission: RE | Admit: 2020-02-06 | Discharge: 2020-02-06 | Disposition: A | Discharge status: Home / Self Care | Payer: Medicaid Other | Source: Ambulatory Visit | Attending: Vascular Surgery | Admitting: Vascular Surgery

## 2020-02-06 ENCOUNTER — Other Ambulatory Visit: Payer: Self-pay

## 2020-02-06 DIAGNOSIS — Z01812 Encounter for preprocedural laboratory examination: Secondary | ICD-10-CM | POA: Diagnosis not present

## 2020-02-06 DIAGNOSIS — Z20822 Contact with and (suspected) exposure to covid-19: Secondary | ICD-10-CM | POA: Diagnosis not present

## 2020-02-06 LAB — SARS CORONAVIRUS 2 (TAT 6-24 HRS): SARS Coronavirus 2: NEGATIVE

## 2020-02-07 MED ORDER — CLINDAMYCIN PHOSPHATE 300 MG/50ML IV SOLN: 300.0000 mg | Freq: Once | INTRAVENOUS | Status: DC

## 2020-02-07 NOTE — Telephone Encounter (Signed)
Patient's arrival time for his LLE angio on 02/15/20 with Dr. Lucky Cowboy has been changed from 8:00 am to 10:00 am arrival to the MM. Tasha at [facility] was called and given the different arrival time as well.

## 2020-02-08 ENCOUNTER — Telehealth (INDEPENDENT_AMBULATORY_CARE_PROVIDER_SITE_OTHER): Payer: Self-pay

## 2020-02-08 ENCOUNTER — Ambulatory Visit
Admission: RE | Admit: 2020-02-08 | Discharge: 2020-02-08 | Disposition: A | Discharge status: Home / Self Care | Payer: Medicaid Other | Attending: Vascular Surgery | Admitting: Vascular Surgery

## 2020-02-08 DIAGNOSIS — L97909 Non-pressure chronic ulcer of unspecified part of unspecified lower leg with unspecified severity: Secondary | ICD-10-CM

## 2020-02-08 NOTE — Telephone Encounter (Signed)
Spoke with Valera Castle at [facility] to let her know that the patient has been rescheduled with Dr. Lucky Cowboy to 02/13/20 with a 9:00 am arrival time to the MM.

## 2020-02-12 ENCOUNTER — Other Ambulatory Visit (INDEPENDENT_AMBULATORY_CARE_PROVIDER_SITE_OTHER): Payer: Self-pay | Admitting: Nurse Practitioner

## 2020-02-13 ENCOUNTER — Other Ambulatory Visit: Payer: Medicaid Other

## 2020-02-13 ENCOUNTER — Ambulatory Visit
Admission: RE | Admit: 2020-02-13 | Discharge: 2020-02-13 | Disposition: A | Discharge status: Skilled Nursing Facility | Payer: Medicaid Other | Attending: Vascular Surgery | Admitting: Vascular Surgery

## 2020-02-13 ENCOUNTER — Encounter
Admission: RE | Disposition: A | Discharge status: Skilled Nursing Facility | Payer: Self-pay | Source: Home / Self Care | Attending: Vascular Surgery

## 2020-02-13 DIAGNOSIS — E1151 Type 2 diabetes mellitus with diabetic peripheral angiopathy without gangrene: Secondary | ICD-10-CM | POA: Insufficient documentation

## 2020-02-13 DIAGNOSIS — E785 Hyperlipidemia, unspecified: Secondary | ICD-10-CM | POA: Diagnosis not present

## 2020-02-13 DIAGNOSIS — I70239 Atherosclerosis of native arteries of right leg with ulceration of unspecified site: Secondary | ICD-10-CM | POA: Diagnosis not present

## 2020-02-13 DIAGNOSIS — K219 Gastro-esophageal reflux disease without esophagitis: Secondary | ICD-10-CM | POA: Diagnosis not present

## 2020-02-13 DIAGNOSIS — L97519 Non-pressure chronic ulcer of other part of right foot with unspecified severity: Secondary | ICD-10-CM | POA: Insufficient documentation

## 2020-02-13 DIAGNOSIS — F17210 Nicotine dependence, cigarettes, uncomplicated: Secondary | ICD-10-CM | POA: Insufficient documentation

## 2020-02-13 DIAGNOSIS — I70249 Atherosclerosis of native arteries of left leg with ulceration of unspecified site: Secondary | ICD-10-CM | POA: Diagnosis not present

## 2020-02-13 DIAGNOSIS — D649 Anemia, unspecified: Secondary | ICD-10-CM | POA: Diagnosis not present

## 2020-02-13 DIAGNOSIS — R1310 Dysphagia, unspecified: Secondary | ICD-10-CM | POA: Insufficient documentation

## 2020-02-13 DIAGNOSIS — I70299 Other atherosclerosis of native arteries of extremities, unspecified extremity: Secondary | ICD-10-CM

## 2020-02-13 DIAGNOSIS — Z7902 Long term (current) use of antithrombotics/antiplatelets: Secondary | ICD-10-CM | POA: Diagnosis not present

## 2020-02-13 DIAGNOSIS — J449 Chronic obstructive pulmonary disease, unspecified: Secondary | ICD-10-CM | POA: Diagnosis not present

## 2020-02-13 DIAGNOSIS — Z79899 Other long term (current) drug therapy: Secondary | ICD-10-CM | POA: Diagnosis not present

## 2020-02-13 DIAGNOSIS — Z8546 Personal history of malignant neoplasm of prostate: Secondary | ICD-10-CM | POA: Diagnosis not present

## 2020-02-13 DIAGNOSIS — F329 Major depressive disorder, single episode, unspecified: Secondary | ICD-10-CM | POA: Insufficient documentation

## 2020-02-13 DIAGNOSIS — Z8673 Personal history of transient ischemic attack (TIA), and cerebral infarction without residual deficits: Secondary | ICD-10-CM | POA: Insufficient documentation

## 2020-02-13 DIAGNOSIS — E43 Unspecified severe protein-calorie malnutrition: Secondary | ICD-10-CM | POA: Insufficient documentation

## 2020-02-13 DIAGNOSIS — Z8551 Personal history of malignant neoplasm of bladder: Secondary | ICD-10-CM | POA: Insufficient documentation

## 2020-02-13 DIAGNOSIS — Z6827 Body mass index (BMI) 27.0-27.9, adult: Secondary | ICD-10-CM | POA: Insufficient documentation

## 2020-02-13 DIAGNOSIS — I70235 Atherosclerosis of native arteries of right leg with ulceration of other part of foot: Secondary | ICD-10-CM | POA: Diagnosis not present

## 2020-02-13 DIAGNOSIS — Z7982 Long term (current) use of aspirin: Secondary | ICD-10-CM | POA: Insufficient documentation

## 2020-02-13 DIAGNOSIS — F419 Anxiety disorder, unspecified: Secondary | ICD-10-CM | POA: Insufficient documentation

## 2020-02-13 HISTORY — PX: LOWER EXTREMITY ANGIOGRAPHY: CATH118251

## 2020-02-13 SURGERY — LOWER EXTREMITY ANGIOGRAPHY
Anesthesia: Moderate Sedation | Laterality: Right

## 2020-02-13 MED ORDER — FENTANYL CITRATE (PF) 100 MCG/2ML IJ SOLN
INTRAMUSCULAR | Status: AC
  Filled 2020-02-13: qty 2

## 2020-02-13 MED ORDER — HEPARIN SODIUM (PORCINE) 1000 UNIT/ML IJ SOLN
INTRAMUSCULAR | Status: AC
  Filled 2020-02-13: qty 1

## 2020-02-13 MED ORDER — MIDAZOLAM HCL 2 MG/2ML IJ SOLN
INTRAMUSCULAR | Status: DC | PRN
  Administered 2020-02-13: 2 mg via INTRAVENOUS
  Administered 2020-02-13: 1 mg via INTRAVENOUS

## 2020-02-13 MED ORDER — CLINDAMYCIN PHOSPHATE 300 MG/50ML IV SOLN: 300.0000 mg | Freq: Once | INTRAVENOUS | Status: AC

## 2020-02-13 MED ORDER — HYDROMORPHONE HCL 1 MG/ML IJ SOLN: 1.0000 mg | Freq: Once | INTRAMUSCULAR | Status: DC | PRN

## 2020-02-13 MED ORDER — MIDAZOLAM HCL 5 MG/5ML IJ SOLN
INTRAMUSCULAR | Status: AC
  Filled 2020-02-13: qty 5

## 2020-02-13 MED ORDER — HEPARIN SOD (PORK) LOCK FLUSH 100 UNIT/ML IV SOLN
INTRAVENOUS | Status: AC
  Filled 2020-02-13: qty 5

## 2020-02-13 MED ORDER — HEPARIN SODIUM (PORCINE) 1000 UNIT/ML IJ SOLN
INTRAMUSCULAR | Status: DC | PRN
  Administered 2020-02-13: 4000 [IU] via INTRAVENOUS

## 2020-02-13 MED ORDER — DIPHENHYDRAMINE HCL 50 MG/ML IJ SOLN: 50.0000 mg | Freq: Once | INTRAMUSCULAR | Status: DC | PRN

## 2020-02-13 MED ORDER — FAMOTIDINE 20 MG PO TABS: 40.0000 mg | ORAL_TABLET | Freq: Once | ORAL | Status: DC | PRN

## 2020-02-13 MED ORDER — CLINDAMYCIN PHOSPHATE 300 MG/50ML IV SOLN
INTRAVENOUS | Status: AC
  Filled 2020-02-13: qty 50

## 2020-02-13 MED ORDER — METHYLPREDNISOLONE SODIUM SUCC 125 MG IJ SOLR: 125.0000 mg | Freq: Once | INTRAMUSCULAR | Status: DC | PRN

## 2020-02-13 MED ORDER — SODIUM CHLORIDE 0.9 % IV SOLN: INTRAVENOUS | Status: DC

## 2020-02-13 MED ORDER — MIDAZOLAM HCL 2 MG/ML PO SYRP: 8.0000 mg | ORAL_SOLUTION | Freq: Once | ORAL | Status: DC | PRN

## 2020-02-13 MED ORDER — IODIXANOL 320 MG/ML IV SOLN
INTRAVENOUS | Status: DC | PRN
  Administered 2020-02-13: 45 mL via INTRA_ARTERIAL

## 2020-02-13 MED ORDER — ONDANSETRON HCL 4 MG/2ML IJ SOLN: 4.0000 mg | Freq: Four times a day (QID) | INTRAMUSCULAR | Status: DC | PRN

## 2020-02-13 MED ORDER — FENTANYL CITRATE (PF) 100 MCG/2ML IJ SOLN
INTRAMUSCULAR | Status: DC | PRN
  Administered 2020-02-13 (×2): 50 ug via INTRAVENOUS

## 2020-02-13 SURGICAL SUPPLY — 18 items
BALLN LUTONIX  018 4X60X130 (BALLOONS) ×1
BALLN LUTONIX 018 4X60X130 (BALLOONS) ×1
BALLN ULTRVRSE 2.5X300X150 (BALLOONS) ×2
BALLOON LUTONIX 018 4X60X130 (BALLOONS) IMPLANT
BALLOON ULTRVRSE 2.5X300X150 (BALLOONS) IMPLANT
CATH ANGIO 5F PIGTAIL 65CM (CATHETERS) ×1 IMPLANT
CATH BEACON 5 .038 100 VERT TP (CATHETERS) ×1 IMPLANT
COVER PROBE U/S 5X48 (MISCELLANEOUS) ×1 IMPLANT
DEVICE PRESTO INFLATION (MISCELLANEOUS) ×1 IMPLANT
DEVICE STARCLOSE SE CLOSURE (Vascular Products) ×1 IMPLANT
PACK ANGIOGRAPHY (CUSTOM PROCEDURE TRAY) ×2 IMPLANT
SHEATH BRITE TIP 5FRX11 (SHEATH) ×1 IMPLANT
SHEATH RAABE 6FRX70 (SHEATH) ×1 IMPLANT
SYR MEDRAD MARK 7 150ML (SYRINGE) ×1 IMPLANT
TUBING CONTRAST HIGH PRESS 72 (TUBING) ×1 IMPLANT
WIRE G V18X300CM (WIRE) ×1 IMPLANT
WIRE J 3MM .035X145CM (WIRE) ×1 IMPLANT
WIRE MAGIC TOR.035 180C (WIRE) ×1 IMPLANT

## 2020-02-13 NOTE — Discharge Instructions (Signed)

## 2020-02-13 NOTE — Telephone Encounter (Signed)
Spoke with Chad [name] at [facility] and the patient has been rescheduled from 02/15/20 to 02/20/20 with a 9:00 am arrival time to the MM. Covid testing on 02/17/20 between 8-1 pm at the [place]. Pre-procedure instructions will be faxed to [facility] at [facility].

## 2020-02-13 NOTE — Op Note (Signed)
[facility]  Percutaneous Study/Intervention Procedural Note   Date of Surgery: 02/13/2020  Surgeon(s):Icholas Irby    Assistants:none  Pre-operative Diagnosis: PAD with ulceration bilateral lower extremities  Post-operative diagnosis:  Same  Procedure(s) Performed:             1.  Ultrasound guidance for vascular access left femoral artery             2.  Catheter placement into right common femoral artery from left femoral approach             3.  Aortogram and selective right lower extremity angiogram             4.  Percutaneous transluminal angioplasty of right posterior tibial artery with 2.5 mm diameter by 30 cm length angioplasty balloon in the mid and distal segments and with a 4 mm diameter by 6 cm length Lutonix drug-coated angioplasty balloon proximally             5.  StarClose closure device left femoral artery  EBL: 10 cc  Contrast: 45 cc  Fluoro Time: 3.4 minutes  Moderate Conscious Sedation Time: approximately 30 minutes using 3 mg of Versed and 100 mcg of Fentanyl              Indications:  Patient is a 59 y.o.male with nonhealing ulcerations bilaterally. The patient has noninvasive study showing tibial disease. The patient is brought in for angiography for further evaluation and potential treatment.  Due to the limb threatening nature of the situation, angiogram was performed for attempted limb salvage. The patient is aware that if the procedure fails, amputation would be expected.  The patient also understands that even with successful revascularization, amputation may still be required due to the severity of the situation.  Risks and benefits are discussed and informed consent is obtained.   Procedure:  The patient was identified and appropriate procedural time out was performed.  The patient was then placed supine on the table and prepped and draped in the usual sterile fashion. Moderate conscious sedation was administered during a face to  face encounter with the patient throughout the procedure with my supervision of the RN administering medicines and monitoring the patient's vital signs, pulse oximetry, telemetry and mental status throughout from the start of the procedure until the patient was taken to the recovery room. Ultrasound was used to evaluate the left common femoral artery.  It was patent .  A digital ultrasound image was acquired.  A Seldinger needle was used to access the left common femoral artery under direct ultrasound guidance and a permanent image was performed.  A 0.035 J wire was advanced without resistance and a 5Fr sheath was placed.  Pigtail catheter was placed into the aorta and an AP aortogram was performed. This demonstrated normal renal arteries and normal aorta and iliac segments without significant stenosis. I then crossed the aortic bifurcation and advanced to the right femoral head. Selective right lower extremity angiogram was then performed. This demonstrated normal common femoral artery, profunda femoris artery, superficial femoral artery, and popliteal artery.  There is a normal tibial trifurcation and the anterior tibial artery was continuous to the foot without focal stenosis.  The peroneal artery was small and did not provide much flow into the foot.  The posterior tibial artery had a proximal stenosis in the 70% range and then in the mid and distal segment there was diffuse disease with areas of short segment occlusion and high-grade stenosis  but there was continuation of the vessel into the foot. It was felt that it was in the patient's best interest to proceed with intervention after these images to avoid a second procedure and a larger amount of contrast and fluoroscopy based off of the findings from the initial angiogram. The patient was systemically heparinized and a 6 French 70 cm sheath was then placed over the Magic tourque wire. I then used a Kumpe catheter and the V18 wire to navigate into the  posterior tibial artery and across the multiple areas of stenosis parking the wire in the foot.  I then proceeded with angioplasty.  For the mid and distal segments down into the foot I used a 2.5 mm diameter by 30 cm length angioplasty balloon inflated to 8 atm for 1 minute.  For the proximal stenosis, we gently inflated a 4 mm diameter by 6 cm length Lutonix drug-coated angioplasty balloon up to about 6 to 8 atm for 1 minute.  Completion imaging showed only about a 15 to 20% residual stenosis in the proximal portion and then spasm limited visualization distally, but there did not appear to be greater than 30% residual stenosis in the areas treated. I elected to terminate the procedure. The sheath was removed and StarClose closure device was deployed in the left femoral artery with excellent hemostatic result. The patient was taken to the recovery room in stable condition having tolerated the procedure well.  Findings:               Aortogram:  Normal renal arteries, normal aorta and iliac arteries without significant stenosis             Right lower Extremity:  Normal common femoral artery, profunda femoris artery, superficial femoral artery, and popliteal artery.  There is a normal tibial trifurcation and the anterior tibial artery was continuous to the foot without focal stenosis.  The peroneal artery was small and did not provide much flow into the foot.  The posterior tibial artery had a proximal stenosis in the 70% range and then in the mid and distal segment there was diffuse disease with areas of short segment occlusion and high-grade stenosis but there was continuation of the vessel into the foot.   Disposition: Patient was taken to the recovery room in stable condition having tolerated the procedure well.  Complications: None  Leotis Pain 02/13/2020 10:40 AM   This note was created with Dragon Medical transcription system. Any errors in dictation are purely unintentional.

## 2020-02-13 NOTE — H&P (Signed)
[facility] History & Physical Update  The patient was interviewed and re-examined.  The patient's previous History and Physical has been reviewed and is unchanged.  There is no change in the plan of care. We plan to proceed with the scheduled procedure.  Leotis Pain, MD  02/13/2020, 8:08 AM

## 2020-02-14 ENCOUNTER — Encounter: Payer: Self-pay | Admitting: Vascular Surgery

## 2020-02-14 ENCOUNTER — Other Ambulatory Visit (INDEPENDENT_AMBULATORY_CARE_PROVIDER_SITE_OTHER): Payer: Self-pay | Admitting: Nurse Practitioner

## 2020-02-16 ENCOUNTER — Other Ambulatory Visit: Admission: RE | Admit: 2020-02-16 | Payer: Medicaid Other | Source: Ambulatory Visit

## 2020-02-16 ENCOUNTER — Other Ambulatory Visit: Payer: Self-pay

## 2020-02-16 ENCOUNTER — Encounter: Payer: Self-pay | Admitting: Internal Medicine

## 2020-02-16 ENCOUNTER — Inpatient Hospital Stay: Payer: Medicaid Other

## 2020-02-16 ENCOUNTER — Inpatient Hospital Stay (HOSPITAL_BASED_OUTPATIENT_CLINIC_OR_DEPARTMENT_OTHER): Payer: Medicaid Other | Admitting: Internal Medicine

## 2020-02-16 DIAGNOSIS — C661 Malignant neoplasm of right ureter: Secondary | ICD-10-CM | POA: Diagnosis not present

## 2020-02-16 DIAGNOSIS — Z7189 Other specified counseling: Secondary | ICD-10-CM

## 2020-02-16 DIAGNOSIS — Z515 Encounter for palliative care: Secondary | ICD-10-CM

## 2020-02-16 DIAGNOSIS — Z5112 Encounter for antineoplastic immunotherapy: Secondary | ICD-10-CM | POA: Diagnosis not present

## 2020-02-16 LAB — CBC WITH DIFFERENTIAL/PLATELET
Abs Immature Granulocytes: 0.03 10*3/uL (ref 0.00–0.07)
Basophils Absolute: 0.1 10*3/uL (ref 0.0–0.1)
Basophils Relative: 1 %
Eosinophils Absolute: 0.5 10*3/uL (ref 0.0–0.5)
Eosinophils Relative: 5 %
HCT: 42.4 % (ref 39.0–52.0)
Hemoglobin: 14 g/dL (ref 13.0–17.0)
Immature Granulocytes: 0 %
Lymphocytes Relative: 18 %
Lymphs Abs: 1.6 10*3/uL (ref 0.7–4.0)
MCH: 26.6 pg (ref 26.0–34.0)
MCHC: 33 g/dL (ref 30.0–36.0)
MCV: 80.5 fL (ref 80.0–100.0)
Monocytes Absolute: 0.6 10*3/uL (ref 0.1–1.0)
Monocytes Relative: 7 %
Neutro Abs: 6.2 10*3/uL (ref 1.7–7.7)
Neutrophils Relative %: 69 %
Platelets: 195 10*3/uL (ref 150–400)
RBC: 5.27 MIL/uL (ref 4.22–5.81)
RDW: 17.2 % — ABNORMAL HIGH (ref 11.5–15.5)
WBC: 8.9 10*3/uL (ref 4.0–10.5)
nRBC: 0 % (ref 0.0–0.2)

## 2020-02-16 LAB — COMPREHENSIVE METABOLIC PANEL
ALT: 21 U/L (ref 0–44)
AST: 29 U/L (ref 15–41)
Albumin: 4 g/dL (ref 3.5–5.0)
Alkaline Phosphatase: 109 U/L (ref 38–126)
Anion gap: 8 (ref 5–15)
BUN: 21 mg/dL (ref 8–23)
CO2: 26 mmol/L (ref 22–32)
Calcium: 8.9 mg/dL (ref 8.9–10.3)
Chloride: 104 mmol/L (ref 98–111)
Creatinine, Ser: 0.85 mg/dL (ref 0.61–1.24)
GFR calc Af Amer: >60 mL/min (ref >60)
GFR calc non Af Amer: >60 mL/min (ref >60)
Glucose, Bld: 88 mg/dL (ref 70–99)
Potassium: 4.1 mmol/L (ref 3.5–5.1)
Sodium: 138 mmol/L (ref 135–145)
Total Bilirubin: 0.7 mg/dL (ref 0.3–1.2)
Total Protein: 8.2 g/dL — ABNORMAL HIGH (ref 6.5–8.1)

## 2020-02-16 MED ORDER — HEPARIN SOD (PORK) LOCK FLUSH 100 UNIT/ML IV SOLN
INTRAVENOUS | Status: AC
  Filled 2020-02-16: qty 5

## 2020-02-16 MED ORDER — HEPARIN SOD (PORK) LOCK FLUSH 100 UNIT/ML IV SOLN
500.0000 [IU] | Freq: Once | INTRAVENOUS | Status: AC | PRN
  Administered 2020-02-16: 500 [IU]
  Filled 2020-02-16: qty 5

## 2020-02-16 MED ORDER — SODIUM CHLORIDE 0.9 % IV SOLN
200.0000 mg | Freq: Once | INTRAVENOUS | Status: AC
  Administered 2020-02-16: 200 mg via INTRAVENOUS
  Filled 2020-02-16: qty 8

## 2020-02-16 MED ORDER — SODIUM CHLORIDE 0.9 % IV SOLN
Freq: Once | INTRAVENOUS | Status: AC
  Filled 2020-02-16: qty 250

## 2020-02-16 NOTE — Progress Notes (Signed)
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; # Colon ca- stage III  GOALS: palliative  CURRENT/MOST RECENT THERAPY: Keytruda [C]    Urothelial cancer ([place])   Initial Diagnosis   Urothelial cancer ([place])   Ureteral cancer, right ([place])  05/26/2018 Initial Diagnosis   Ureteral cancer, right ([place])   06/21/2018 -  Chemotherapy   The patient had pembrolizumab (KEYTRUDA) 200 mg in sodium chloride 0.9 % 50 mL chemo infusion, 200 mg, Intravenous, Once, 22 of 25 cycles Administration: 200 mg (06/21/2018), 200 mg (07/12/2018), 200 mg (08/02/2018), 200 mg (08/23/2018), 200 mg (09/13/2018), 200 mg (10/04/2018), 200 mg (10/25/2018), 200 mg (11/22/2018), 200 mg (12/13/2018), 200 mg (01/05/2019), 200 mg (01/24/2019), 200 mg (02/14/2019), 200 mg (03/11/2019), 200 mg (06/17/2019), 200 mg (08/26/2019), 200 mg (09/16/2019), 200 mg (10/07/2019), 200 mg (10/28/2019), 200 mg (11/24/2019), 200 mg (12/15/2019), 200 mg (01/05/2020), 200 mg (01/26/2020)  for chemotherapy treatment.       HISTORY OF PRESENTING ILLNESS: Patient is a poor historian.  Is alone.  Chad [name] 59 y.o.  male above history of stage IV-ureteral cancer/with multiple co-morbidities currently on Keytruda is here for follow-up.  Denies any nausea vomiting.  Denies any diarrhea.  Chronic joint pains.  Chronic shortness of breath chronic cough not any worse.  Review of Systems  Constitutional: Positive for  malaise/fatigue. Negative for chills, diaphoresis, fever and weight loss.  HENT: Negative for nosebleeds and sore throat.   Eyes: Negative for double vision.  Respiratory: Positive for cough and shortness of breath. Negative for hemoptysis and wheezing.   Cardiovascular: Negative for chest pain, palpitations and orthopnea.  Gastrointestinal: Negative for abdominal pain, blood in stool, constipation, diarrhea, heartburn, melena, nausea and vomiting.  Genitourinary: Negative for dysuria, frequency and urgency.  Musculoskeletal: Positive for back pain and joint pain.  Skin: Negative.  Negative for itching and rash.  Neurological: Positive for focal weakness. Negative for dizziness, tingling, weakness and headaches.       Chronic left-sided weakness upper than lower extremity.  Endo/Heme/Allergies: Does not bruise/bleed easily.  Psychiatric/Behavioral: Negative for depression. The patient is not nervous/anxious and does not have insomnia.      MEDICAL HISTORY:  Past Medical History:  Diagnosis Date  . Anemia   . Anxiety   . ARF (acute respiratory failure) ([place])   . Bladder cancer ([place])   . Cancer [facility])    prostate  . COPD (chronic obstructive pulmonary disease) ([place])   . Depression   . Dysphagia   . Family history of colon cancer   . Family history of kidney cancer   . Family history of leukemia   . Family history of prostate cancer   . GERD (gastroesophageal reflux disease)   . Hepatitis    chronic hep c  . Hydronephrosis   . Hydronephrosis with ureteral stricture   . Hyperlipidemia   . Knee pain    Left  . Malignant neoplasm of colon ([place])   . Nerve  pain   . Peripheral vascular disease ([place] [address])   . Stroke ([place])   . Urinary frequency   . Venous hypertension of both lower extremities     SURGICAL HISTORY: Past Surgical History:  Procedure Laterality Date  . COLON SURGERY     En bloc extended right hemicolectomy 07/2017  . CYSTOSCOPY W/ RETROGRADES Right 08/30/2018    Procedure: CYSTOSCOPY WITH RETROGRADE PYELOGRAM;  Surgeon: Hollice Espy, MD;  Location: [facility];  Service: Urology;  Laterality: Right;  . CYSTOSCOPY WITH STENT PLACEMENT Right 04/25/2018   Procedure: CYSTOSCOPY WITH STENT PLACEMENT;  Surgeon: Hollice Espy, MD;  Location: [facility];  Service: Urology;  Laterality: Right;  . CYSTOSCOPY WITH STENT PLACEMENT Right 08/30/2018   Procedure: [place] WITH STENT Exchange;  Surgeon: Hollice Espy, MD;  Location: [facility];  Service: Urology;  Laterality: Right;  . CYSTOSCOPY WITH STENT PLACEMENT Right 03/07/2019   Procedure: CYSTOSCOPY WITH STENT Exchange;  Surgeon: Hollice Espy, MD;  Location: [facility];  Service: Urology;  Laterality: Right;  . CYSTOSCOPY WITH STENT PLACEMENT Right 11/21/2019   Procedure: CYSTOSCOPY WITH STENT Exchange;  Surgeon: Hollice Espy, MD;  Location: [facility];  Service: Urology;  Laterality: Right;  . LOWER EXTREMITY ANGIOGRAPHY Left 05/23/2019   Procedure: LOWER EXTREMITY ANGIOGRAPHY;  Surgeon: Algernon Huxley, MD;  Location: [place] [facility];  Service: Cardiovascular;  Laterality: Left;  . LOWER EXTREMITY ANGIOGRAPHY Right 05/30/2019   Procedure: LOWER EXTREMITY ANGIOGRAPHY;  Surgeon: Algernon Huxley, MD;  Location: [place] [facility];  Service: Cardiovascular;  Laterality: Right;  . LOWER EXTREMITY ANGIOGRAPHY Right 02/13/2020   Procedure: LOWER EXTREMITY ANGIOGRAPHY;  Surgeon: Algernon Huxley, MD;  Location: [place] [facility];  Service: Cardiovascular;  Laterality: Right;  . PORTA CATH INSERTION N/A 02/28/2019   Procedure: PORTA CATH INSERTION;  Surgeon: Algernon Huxley, MD;  Location: [place] [facility];  Service: Cardiovascular;  Laterality: N/A;  . tumor removed       SOCIAL HISTORY: Social History   Socioeconomic History  . Marital status: Single    Spouse name: Not on file  . Number of children: Not on file  . Years of education: Not on file  . Highest education level: Not on file  Occupational History  .  Not on file  Tobacco Use  . Smoking status: Current Every Day Smoker    Packs/day: 1.00    Types: Cigarettes  . Smokeless tobacco: Never Used  Vaping Use  . Vaping Use: Never used  Substance and Sexual Activity  . Alcohol use: Not Currently  . Drug use: Not Currently  . Sexual activity: Not on file  Other Topics Concern  . Not on file  Social History Narrative    used to live [place]; moved  To [place]- end of April 2019; in Nursing home; 1pp/day; quit alcohol. 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        Family- dad- prostate ca [at 46y]; brother- 83 died of prostate cancer; brother- 6- no cancers [[place]]; sonGerald Stabs [[place]];[name]-32y prostate ca [facility] [place]]; daughter- 35 [[place]]; another daughter 57 [[place]/addict]. will refer genetics counseling. Given MSI- abnormal; highly suspicious of Lynch syndrome.  Patient's son [name] aware of high possible lynch syndrome.   Social Determinants of Health   Financial Resource Strain:   . Difficulty of Paying Living Expenses:   Food Insecurity:   . Worried About Charity fundraiser in the Last Year:   . [place] in the Last  Year:   Transportation Needs:   . Film/video editor (Medical):   Marland Kitchen Lack of Transportation (Non-Medical):   Physical Activity:   . Days of Exercise per Week:   . Minutes of Exercise per Session:   Stress:   . Feeling of Stress :   Social Connections:   . Frequency of Communication with Friends and Family:   . Frequency of Social Gatherings with Friends and Family:   . Attends Religious Services:   . Active Member of Clubs or Organizations:   . Attends Archivist Meetings:   Marland Kitchen Marital Status:   Intimate Partner Violence:   . Fear of Current or Ex-Partner:   . Emotionally Abused:   Marland Kitchen Physically Abused:   . Sexually Abused:     FAMILY HISTORY: Family History  Problem Relation Age of Onset  . Prostate cancer Father 74  . Cancer Brother 50       unsure type  . Cancer Paternal Uncle        unsure  type  . Cancer Maternal Grandmother        unsure type  . Cancer Paternal Grandmother        unsure type  . Kidney cancer Paternal Grandfather   . Cancer Other        unsure types  . Leukemia Son   . Cancer Son        other cancers, possibly colon    ALLERGIES:  is allergic to penicillins.  MEDICATIONS:  Current Outpatient Medications  Medication Sig Dispense Refill  . acetaminophen (TYLENOL) 325 MG tablet Take 650 mg by mouth 3 (three) times daily.     Marland Kitchen aspirin EC 81 MG tablet Take 1 tablet (81 mg total) by mouth daily. 150 tablet 2  . atorvastatin (LIPITOR) 10 MG tablet Take 1 tablet (10 mg total) by mouth daily. 30 tablet 11  . clopidogrel (PLAVIX) 75 MG tablet Take 1 tablet (75 mg total) by mouth daily. 30 tablet 11  . collagenase (SANTYL) ointment Apply 1 application topically daily. LEFT LOWER LEG    . cyclobenzaprine (FLEXERIL) 10 MG tablet Take 10 mg by mouth at bedtime.    . diclofenac sodium (VOLTAREN) 1 % GEL Apply 2 g topically See admin instructions. Apply 2 g topically 4 times daily. Apply 2 g to left shoulder, apply 2 g to left hip and 2 g to left knee 4 times daily per [facility] instructions    . escitalopram (LEXAPRO) 5 MG tablet Take 5 mg by mouth daily.     Marland Kitchen gabapentin (NEURONTIN) 100 MG capsule Take 100 mg by mouth 3 (three) times daily.    . mirtazapine (REMERON) 7.5 MG tablet Take 7.5 mg by mouth at bedtime.    Marland Kitchen oxycodone (OXY-IR) 5 MG capsule Take 10 mg by mouth every 6 (six) hours as needed for pain.     . pantoprazole (PROTONIX) 40 MG tablet Take 40 mg by mouth daily.     No current facility-administered medications for this visit.      Marland Kitchen  PHYSICAL EXAMINATION: ECOG PERFORMANCE STATUS: 1 - Symptomatic but completely ambulatory  Vitals:   02/16/20 0909  BP: 121/88  Pulse: 78  Temp: (!) 96.6 F (35.9 C)  SpO2: 98%   Filed Weights   02/16/20 0909  Weight: 171 lb (77.6 kg)    Physical Exam Constitutional:      Comments: In  wheelchair. Cachectic.  Appears older than his stated age.  Alone.  HENT:  Head: Normocephalic and atraumatic.     Mouth/Throat:     Pharynx: No oropharyngeal exudate.  Eyes:     Pupils: Pupils are equal, round, and reactive to light.  Cardiovascular:     Rate and Rhythm: Normal rate and regular rhythm.  Pulmonary:     Effort: No respiratory distress.     Breath sounds: No wheezing.  Abdominal:     General: Bowel sounds are normal. There is no distension.     Palpations: Abdomen is soft. There is no mass.     Tenderness: There is no abdominal tenderness. There is no guarding or rebound.  Musculoskeletal:        General: No tenderness. Normal range of motion.     Cervical back: Normal range of motion and neck supple.  Skin:    General: Skin is warm.     Comments: Bilateral lower extremity ulcerations noted.  Pulses intact bilaterally.  Neurological:     Mental Status: He is oriented to person, place, and time.     Comments: Sleepy but easily arousable.  Chronic weakness left upper and lower extremity.  Psychiatric:        Mood and Affect: Affect normal.      LABORATORY DATA:  I have reviewed the data as listed Lab Results  Component Value Date   WBC 8.9 02/16/2020   HGB 14.0 02/16/2020   HCT 42.4 02/16/2020   MCV 80.5 02/16/2020   PLT 195 02/16/2020   Recent Labs    01/05/20 0900 01/26/20 0834 02/16/20 0855  NA 139 139 138  K 3.9 4.2 4.1  CL 107 104 104  CO2 _0 GLUCOSE 104* 94 88  BUN _1 CREATININE 0.98 0.93 0.85  CALCIUM 8.5* 8.8* 8.9  GFRNONAA >60 >60 >60  GFRAA >60 >60 >60  PROT 7.9 8.0 8.2*  ALBUMIN 3.9 3.9 4.0  AST 32 31 29  ALT 32 26 21  ALKPHOS 136* 125 109  BILITOT 0.5 0.6 0.7    RADIOGRAPHIC STUDIES: I have personally reviewed the radiological images as listed and agreed with the findings in the report. PERIPHERAL VASCULAR CATHETERIZATION  Result Date: 02/13/2020 See op note  VAS [place] ABI WITH/WO TBI  Result Date:  02/03/2020 LOWER EXTREMITY DOPPLER STUDY  Vascular Interventions: 05/23/2019 PTA left PTA.                         05/30/2019 PTA right distal PTA. Comparison Study: 06/27/2019 Performing Technologist: Concha Norway RVT  Examination Guidelines: A complete evaluation includes at minimum, Doppler waveform signals and systolic blood pressure reading at the level of bilateral brachial, anterior tibial, and posterior tibial arteries, when vessel segments are accessible. Bilateral testing is considered an integral part of a complete examination. Photoelectric Plethysmograph (PPG) waveforms and toe systolic pressure readings are included as required and additional duplex testing as needed. Limited examinations for reoccurring indications may be performed as noted.  ABI Findings: +---------+------------------+-----+--------+--------+ Right    Rt Pressure (mmHg)IndexWaveformComment  +---------+------------------+-----+--------+--------+ Brachial 132                                     +---------+------------------+-----+--------+--------+ ATA      128               0.97 biphasic         +---------+------------------+-----+--------+--------+ PTA  132               1.00 biphasic         +---------+------------------+-----+--------+--------+ Great Toe49                0.37 Abnormal         +---------+------------------+-----+--------+--------+ +---------+------------------+-----+---------+-------+ Left     Lt Pressure (mmHg)IndexWaveform Comment +---------+------------------+-----+---------+-------+ ATA      158               1.20 triphasic        +---------+------------------+-----+---------+-------+ PTA      133               1.01 biphasic         +---------+------------------+-----+---------+-------+ Great Toe99                0.75 Normal           +---------+------------------+-----+---------+-------+ +-------+-----------+-----------+------------+------------+  ABI/TBIToday's ABIToday's TBIPrevious ABIPrevious TBI +-------+-----------+-----------+------------+------------+ Right  1.00       .37        .88         .71          +-------+-----------+-----------+------------+------------+ Left   1.20       .75        1.09        .70          +-------+-----------+-----------+------------+------------+ Right TBIs appear decreased compared to prior study on 06/27/2019.  Summary: Right: Resting right ankle-brachial index is within normal range. No evidence of significant right lower extremity arterial disease. The right toe-brachial index is abnormal. Left: Resting left ankle-brachial index is within normal range. No evidence of significant left lower extremity arterial disease. The left toe-brachial index is normal.  *See table(s) above for measurements and observations.  Electronically signed by Leotis Pain MD on 02/03/2020 at 9:01:46 AM.   Final    VAS [place] LOWER EXTREMITY ARTERIAL DUPLEX  Result Date: 02/03/2020 LOWER EXTREMITY ARTERIAL DUPLEX STUDY  Vascular Interventions: Right SFA PTA. Current ABI:            rt = 1.00 lt= 1.20 Comparison Study: 04/27/2019 Performing Technologist: Concha Norway RVT  Examination Guidelines: A complete evaluation includes B-mode imaging, spectral Doppler, color Doppler, and power Doppler as needed of all accessible portions of each vessel. Bilateral testing is considered an integral part of a complete examination. Limited examinations for reoccurring indications may be performed as noted.  +----------+--------+-----+--------+----------+--------+ RIGHT     PSV cm/sRatioStenosisWaveform  Comments +----------+--------+-----+--------+----------+--------+ CFA Mid   121                  monophasic         +----------+--------+-----+--------+----------+--------+ DFA       86                   monophasic         +----------+--------+-----+--------+----------+--------+ SFA Prox  124                  triphasic           +----------+--------+-----+--------+----------+--------+ SFA Mid   111                  triphasic          +----------+--------+-----+--------+----------+--------+ SFA Distal114                  biphasic           +----------+--------+-----+--------+----------+--------+ POP Distal49  biphasic           +----------+--------+-----+--------+----------+--------+ ATA Distal67                   monophasic         +----------+--------+-----+--------+----------+--------+ PTA Distal56                   monophasic         +----------+--------+-----+--------+----------+--------+  +----------+--------+-----+--------+----------+--------+ LEFT      PSV cm/sRatioStenosisWaveform  Comments +----------+--------+-----+--------+----------+--------+ CFA Mid   94                   triphasic          +----------+--------+-----+--------+----------+--------+ DFA       67                   biphasic           +----------+--------+-----+--------+----------+--------+ SFA Prox  78                   triphasic          +----------+--------+-----+--------+----------+--------+ SFA Mid   106                  triphasic          +----------+--------+-----+--------+----------+--------+ SFA Distal96                   triphasic          +----------+--------+-----+--------+----------+--------+ POP Distal85                   triphasic          +----------+--------+-----+--------+----------+--------+ ATA Distal84                   biphasic           +----------+--------+-----+--------+----------+--------+ PTA Distal67                   monophasic         +----------+--------+-----+--------+----------+--------+  Summary: Right: Mild atherosclerosis to the knee. Moderate atherosclerosis in the tibial vessels. Left: Mild atherosclerosis to the knee. Moderate atherosclerosis in the tibial vessels.  See table(s) above for measurements and  observations. Electronically signed by Leotis Pain MD on 02/03/2020 at 9:01:49 AM.    Final     ASSESSMENT & PLAN:   Ureteral cancer, right ([place]) # High-grade urothelial cancer/cytology; likely of the right renal pelvis /upper ureter.  May 2021 C/A/P-stable; Slight ureteral thickening/small pelvic/retroperitoneal lymph nodes.  No lesions in the liver. STABLE  # Proceed with [place] today; Labs today reviewed;  acceptable for treatment today.  # Iron deficiency anemia-hemoglobin 13-improved. STABLE.   # Bilateral LE ulcers-s/p wound care evaluation; s/p PTCA with Dr.Dew. bil Arterial Dopp-wnl. STABLE.   #DISPOSITION: Chad [name] today;  # follow up in 3 weeks -MD labs-cbc/cmp; Keytruda/- Dr.B     All questions were answered. The patient knows to call the clinic with any problems, questions or concerns.    Cammie Sickle, MD 02/16/2020 9:40 AM

## 2020-02-16 NOTE — Assessment & Plan Note (Addendum)
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  May 2021 C/A/P-stable; Slight ureteral thickening/small pelvic/retroperitoneal lymph nodes.  No lesions in the liver. STABLE  # Proceed with [place] today; Labs today reviewed;  acceptable for treatment today.  # Iron deficiency anemia-hemoglobin 13-improved. STABLE.   # Bilateral LE ulcers-s/p wound care evaluation; s/p PTCA with Dr.Dew. bil Arterial Dopp-wnl. STABLE.   #DISPOSITION: [name] today;  # follow up in 3 weeks -MD labs-cbc/cmp; Keytruda/- Dr.B

## 2020-02-17 ENCOUNTER — Other Ambulatory Visit
Admission: RE | Admit: 2020-02-17 | Discharge: 2020-02-17 | Disposition: A | Discharge status: Home / Self Care | Payer: Medicaid Other | Source: Ambulatory Visit | Attending: Vascular Surgery | Admitting: Vascular Surgery

## 2020-02-17 DIAGNOSIS — Z01812 Encounter for preprocedural laboratory examination: Secondary | ICD-10-CM | POA: Insufficient documentation

## 2020-02-17 DIAGNOSIS — Z20822 Contact with and (suspected) exposure to covid-19: Secondary | ICD-10-CM | POA: Diagnosis not present

## 2020-02-17 LAB — SARS CORONAVIRUS 2 (TAT 6-24 HRS): SARS Coronavirus 2: NEGATIVE

## 2020-02-17 NOTE — H&P (Signed)
[facility] History & Physical Update  The patient was interviewed and re-examined.  The patient's previous History and Physical has been reviewed and is unchanged.  There is no change in the plan of care. We plan to proceed with the scheduled procedure.  Leotis Pain, MD  02/17/2020, 10:26 AM  See Eulogio Ditch note 02/01/20

## 2020-02-20 ENCOUNTER — Other Ambulatory Visit: Payer: Self-pay

## 2020-02-20 ENCOUNTER — Ambulatory Visit
Admission: RE | Admit: 2020-02-20 | Discharge: 2020-02-20 | Disposition: A | Discharge status: Home / Self Care | Payer: Medicaid Other | Attending: Vascular Surgery | Admitting: Vascular Surgery

## 2020-02-20 ENCOUNTER — Encounter
Admission: RE | Disposition: A | Discharge status: Home / Self Care | Payer: Self-pay | Source: Home / Self Care | Attending: Vascular Surgery

## 2020-02-20 ENCOUNTER — Encounter: Payer: Self-pay | Admitting: Vascular Surgery

## 2020-02-20 DIAGNOSIS — E1151 Type 2 diabetes mellitus with diabetic peripheral angiopathy without gangrene: Secondary | ICD-10-CM | POA: Insufficient documentation

## 2020-02-20 DIAGNOSIS — L97519 Non-pressure chronic ulcer of other part of right foot with unspecified severity: Secondary | ICD-10-CM | POA: Insufficient documentation

## 2020-02-20 DIAGNOSIS — I70235 Atherosclerosis of native arteries of right leg with ulceration of other part of foot: Secondary | ICD-10-CM | POA: Diagnosis not present

## 2020-02-20 DIAGNOSIS — L97529 Non-pressure chronic ulcer of other part of left foot with unspecified severity: Secondary | ICD-10-CM | POA: Insufficient documentation

## 2020-02-20 DIAGNOSIS — E785 Hyperlipidemia, unspecified: Secondary | ICD-10-CM | POA: Insufficient documentation

## 2020-02-20 DIAGNOSIS — E11621 Type 2 diabetes mellitus with foot ulcer: Secondary | ICD-10-CM | POA: Diagnosis not present

## 2020-02-20 DIAGNOSIS — Z8546 Personal history of malignant neoplasm of prostate: Secondary | ICD-10-CM | POA: Insufficient documentation

## 2020-02-20 DIAGNOSIS — J449 Chronic obstructive pulmonary disease, unspecified: Secondary | ICD-10-CM | POA: Insufficient documentation

## 2020-02-20 DIAGNOSIS — K219 Gastro-esophageal reflux disease without esophagitis: Secondary | ICD-10-CM | POA: Diagnosis not present

## 2020-02-20 DIAGNOSIS — F419 Anxiety disorder, unspecified: Secondary | ICD-10-CM | POA: Insufficient documentation

## 2020-02-20 DIAGNOSIS — F17210 Nicotine dependence, cigarettes, uncomplicated: Secondary | ICD-10-CM | POA: Insufficient documentation

## 2020-02-20 DIAGNOSIS — Z85038 Personal history of other malignant neoplasm of large intestine: Secondary | ICD-10-CM | POA: Diagnosis not present

## 2020-02-20 DIAGNOSIS — Z7982 Long term (current) use of aspirin: Secondary | ICD-10-CM | POA: Diagnosis not present

## 2020-02-20 DIAGNOSIS — R1310 Dysphagia, unspecified: Secondary | ICD-10-CM | POA: Insufficient documentation

## 2020-02-20 DIAGNOSIS — Z7902 Long term (current) use of antithrombotics/antiplatelets: Secondary | ICD-10-CM | POA: Insufficient documentation

## 2020-02-20 DIAGNOSIS — Z8551 Personal history of malignant neoplasm of bladder: Secondary | ICD-10-CM | POA: Diagnosis not present

## 2020-02-20 DIAGNOSIS — D649 Anemia, unspecified: Secondary | ICD-10-CM | POA: Insufficient documentation

## 2020-02-20 DIAGNOSIS — Z79899 Other long term (current) drug therapy: Secondary | ICD-10-CM | POA: Insufficient documentation

## 2020-02-20 DIAGNOSIS — L97909 Non-pressure chronic ulcer of unspecified part of unspecified lower leg with unspecified severity: Secondary | ICD-10-CM

## 2020-02-20 DIAGNOSIS — F329 Major depressive disorder, single episode, unspecified: Secondary | ICD-10-CM | POA: Diagnosis not present

## 2020-02-20 DIAGNOSIS — I70249 Atherosclerosis of native arteries of left leg with ulceration of unspecified site: Secondary | ICD-10-CM | POA: Diagnosis not present

## 2020-02-20 DIAGNOSIS — I70245 Atherosclerosis of native arteries of left leg with ulceration of other part of foot: Secondary | ICD-10-CM | POA: Diagnosis not present

## 2020-02-20 DIAGNOSIS — I70239 Atherosclerosis of native arteries of right leg with ulceration of unspecified site: Secondary | ICD-10-CM | POA: Diagnosis not present

## 2020-02-20 DIAGNOSIS — Z8673 Personal history of transient ischemic attack (TIA), and cerebral infarction without residual deficits: Secondary | ICD-10-CM | POA: Diagnosis not present

## 2020-02-20 HISTORY — PX: LOWER EXTREMITY ANGIOGRAPHY: CATH118251

## 2020-02-20 LAB — CREATININE, SERUM
Creatinine, Ser: 0.7 mg/dL (ref 0.61–1.24)
GFR calc Af Amer: >60 mL/min (ref >60)
GFR calc non Af Amer: >60 mL/min (ref >60)

## 2020-02-20 LAB — BUN: BUN: 17 mg/dL (ref 8–23)

## 2020-02-20 SURGERY — LOWER EXTREMITY ANGIOGRAPHY
Anesthesia: Moderate Sedation | Laterality: Left

## 2020-02-20 MED ORDER — HEPARIN SODIUM (PORCINE) 1000 UNIT/ML IJ SOLN
INTRAMUSCULAR | Status: AC
  Filled 2020-02-20: qty 1

## 2020-02-20 MED ORDER — HEPARIN SOD (PORK) LOCK FLUSH 100 UNIT/ML IV SOLN
INTRAVENOUS | Status: AC
  Filled 2020-02-20: qty 5

## 2020-02-20 MED ORDER — CLINDAMYCIN PHOSPHATE 300 MG/50ML IV SOLN
INTRAVENOUS | Status: AC
  Filled 2020-02-20: qty 50

## 2020-02-20 MED ORDER — FENTANYL CITRATE (PF) 100 MCG/2ML IJ SOLN
INTRAMUSCULAR | Status: AC
  Filled 2020-02-20: qty 2

## 2020-02-20 MED ORDER — DIPHENHYDRAMINE HCL 50 MG/ML IJ SOLN: 50.0000 mg | Freq: Once | INTRAMUSCULAR | Status: DC | PRN

## 2020-02-20 MED ORDER — MIDAZOLAM HCL 2 MG/2ML IJ SOLN
INTRAMUSCULAR | Status: DC | PRN
  Administered 2020-02-20 (×2): 2 mg via INTRAVENOUS

## 2020-02-20 MED ORDER — CEFAZOLIN SODIUM-DEXTROSE 2-4 GM/100ML-% IV SOLN
INTRAVENOUS | Status: AC
  Filled 2020-02-20: qty 100

## 2020-02-20 MED ORDER — HYDROMORPHONE HCL 1 MG/ML IJ SOLN: 1.0000 mg | Freq: Once | INTRAMUSCULAR | Status: DC | PRN

## 2020-02-20 MED ORDER — CLINDAMYCIN PHOSPHATE 300 MG/50ML IV SOLN: 300.0000 mg | Freq: Once | INTRAVENOUS | Status: AC

## 2020-02-20 MED ORDER — ONDANSETRON HCL 4 MG/2ML IJ SOLN: 4.0000 mg | Freq: Four times a day (QID) | INTRAMUSCULAR | Status: DC | PRN

## 2020-02-20 MED ORDER — MIDAZOLAM HCL 2 MG/ML PO SYRP: 8.0000 mg | ORAL_SOLUTION | Freq: Once | ORAL | Status: DC | PRN

## 2020-02-20 MED ORDER — SODIUM CHLORIDE 0.9 % IV SOLN: INTRAVENOUS | Status: DC

## 2020-02-20 MED ORDER — FAMOTIDINE 20 MG PO TABS: 40.0000 mg | ORAL_TABLET | Freq: Once | ORAL | Status: DC | PRN

## 2020-02-20 MED ORDER — METHYLPREDNISOLONE SODIUM SUCC 125 MG IJ SOLR: 125.0000 mg | Freq: Once | INTRAMUSCULAR | Status: DC | PRN

## 2020-02-20 MED ORDER — MIDAZOLAM HCL 5 MG/5ML IJ SOLN
INTRAMUSCULAR | Status: AC
  Filled 2020-02-20: qty 5

## 2020-02-20 MED ORDER — FENTANYL CITRATE (PF) 100 MCG/2ML IJ SOLN
INTRAMUSCULAR | Status: DC | PRN
  Administered 2020-02-20 (×2): 50 ug via INTRAVENOUS

## 2020-02-20 SURGICAL SUPPLY — 6 items
CATH BEACON 5 .035 65 RIM TIP (CATHETERS) ×2 IMPLANT
CATH BEACON 5 .038 100 VERT TP (CATHETERS) ×2 IMPLANT
DEVICE STARCLOSE SE CLOSURE (Vascular Products) ×2 IMPLANT
GLIDEWIRE ADV .035X260CM (WIRE) ×2 IMPLANT
PACK ANGIOGRAPHY (CUSTOM PROCEDURE TRAY) ×2 IMPLANT
SHEATH BRITE TIP 5FRX11 (SHEATH) ×2 IMPLANT

## 2020-02-20 NOTE — H&P (Signed)
[facility] History & Physical Update  The patient was interviewed and re-examined.  The patient's previous History and Physical has been reviewed and is unchanged.  There is no change in the plan of care. We plan to proceed with the scheduled procedure.  Leotis Pain, MD  02/20/2020, 9:21 AM

## 2020-02-20 NOTE — Discharge Instructions (Signed)

## 2020-02-20 NOTE — Interval H&P Note (Signed)
History and Physical Interval Note:  02/20/2020 9:21 AM  Chad [name]  has presented today for surgery, with the diagnosis of LLE Angiography   ASO with ulceration   BARD Rep   cc: M Godley, S Willey   Pt to have Covid test on 02-13-20.  The various methods of treatment have been discussed with the patient and family. After consideration of risks, benefits and other options for treatment, the patient has consented to  Procedure(s): LOWER EXTREMITY ANGIOGRAPHY (Left) as a surgical intervention.  The patient's history has been reviewed, patient examined, no change in status, stable for surgery.  I have reviewed the patient's chart and labs.  Questions were answered to the patient's satisfaction.     Leotis Pain

## 2020-02-20 NOTE — Op Note (Signed)
[facility]  Percutaneous Study/Intervention Procedural Note   Date of Surgery: 02/20/2020  Surgeon(s):Harold Moncus    Assistants:none  Pre-operative Diagnosis: PAD with ulceration bilateral lower extremities  Post-operative diagnosis:  Same  Procedure(s) Performed:             1.  Ultrasound guidance for vascular access right femoral artery             2.  Catheter placement into left SFA from right femoral approach             3.  Aortogram and selective left lower extremity angiogram             4.  StarClose closure device right femoral artery  EBL: 3 cc  Contrast: 30 cc  Fluoro Time: 1.1 minutes  Moderate Conscious Sedation Time: approximately 20 minutes using 4 mg of Versed and 100 mcg of Fentanyl              Indications:  Patient is a 59 y.o.male with nonhealing ulcerations bilaterally history of vascular disease status post previous interventions to both lower extremities. The patient is brought in for angiography for further evaluation and potential treatment.  Due to the limb threatening nature of the situation, angiogram was performed for attempted limb salvage. The patient is aware that if the procedure fails, amputation would be expected.  The patient also understands that even with successful revascularization, amputation may still be required due to the severity of the situation.  Risks and benefits are discussed and informed consent is obtained.   Procedure:  The patient was identified and appropriate procedural time out was performed.  The patient was then placed supine on the table and prepped and draped in the usual sterile fashion. Moderate conscious sedation was administered during a face to face encounter with the patient throughout the procedure with my supervision of the RN administering medicines and monitoring the patient's vital signs, pulse oximetry, telemetry and mental status throughout from the start of the procedure until the patient  was taken to the recovery room. Ultrasound was used to evaluate the right common femoral artery.  It was patent .  A digital ultrasound image was acquired.  A Seldinger needle was used to access the right common femoral artery under direct ultrasound guidance and a permanent image was performed.  A 0.035 J wire was advanced without resistance and a 5Fr sheath was placed.   Aortogram was not performed as one was done previously and no proximal disease was seen.  I then crossed the aortic bifurcation and advanced to the left femoral head.  I then advanced into the proximal to mid SFA to help opacify distally.  Selective left lower extremity angiogram was then performed. This demonstrated normal common femoral artery, profunda femoris artery, superficial femoral artery, and popliteal arteries.  There is a typical tibial trifurcation with a very large peroneal artery which was widely patent and was the best runoff distally without significant disease.  The posterior tibial artery is diffusely diseased proximally and occluded without distal reconstitution.  This is a vessel that had been treated a year or so ago, but no longer had a distal target for revascularization.  The anterior tibial artery occluded proximally without distal reconstitution.  There is no need for endovascular revascularization at this time.  He had 1 vessel runoff which was patent and no other targets for revascularization. I elected to terminate the procedure. The sheath was removed and StarClose closure device was deployed  in the right femoral artery with excellent hemostatic result. The patient was taken to the recovery room in stable condition having tolerated the procedure well.  Findings:                            Left Lower Extremity:  This demonstrated normal common femoral artery, profunda femoris artery, superficial femoral artery, and popliteal arteries.  There is a typical tibial trifurcation with a very large peroneal artery which  was widely patent and was the best runoff distally without significant disease.  The posterior tibial artery is diffusely diseased proximally and occluded without distal reconstitution.  This is a vessel that had been treated a year or so ago, but no longer had a distal target for revascularization.  The anterior tibial artery occluded proximally without distal reconstitution.   Disposition: Patient was taken to the recovery room in stable condition having tolerated the procedure well.  Complications: None  Leotis Pain 02/20/2020 10:49 AM   This note was created with Dragon Medical transcription system. Any errors in dictation are purely unintentional.

## 2020-02-20 NOTE — H&P (Signed)
[facility] History & Physical Update  The patient was interviewed and re-examined.  The patient's previous History and Physical has been reviewed and is unchanged.  There is no change in the plan of care. We plan to proceed with the scheduled procedure.  Leotis Pain, MD  02/20/2020, 5:56 PM

## 2020-02-20 NOTE — Interval H&P Note (Signed)
History and Physical Interval Note:  02/20/2020 5:56 PM  Chad [name]  has presented today for surgery, with the diagnosis of RT Leg Angiography   ASO with ulceration   BARD Rep  cc: M Godley,S Willey   Pt to have Covid on 02-06-20.  The various methods of treatment have been discussed with the patient and family. After consideration of risks, benefits and other options for treatment, the patient has consented to  Procedure(s): LOWER EXTREMITY ANGIOGRAPHY (Right) as a surgical intervention.  The patient's history has been reviewed, patient examined, no change in status, stable for surgery.  I have reviewed the patient's chart and labs.  Questions were answered to the patient's satisfaction.     Leotis Pain

## 2020-03-08 ENCOUNTER — Inpatient Hospital Stay: Payer: Medicaid Other

## 2020-03-08 ENCOUNTER — Other Ambulatory Visit: Payer: Self-pay

## 2020-03-08 ENCOUNTER — Inpatient Hospital Stay: Payer: Medicaid Other | Attending: Internal Medicine

## 2020-03-08 ENCOUNTER — Inpatient Hospital Stay (HOSPITAL_BASED_OUTPATIENT_CLINIC_OR_DEPARTMENT_OTHER): Payer: Medicaid Other | Admitting: Internal Medicine

## 2020-03-08 ENCOUNTER — Encounter: Payer: Self-pay | Admitting: Internal Medicine

## 2020-03-08 DIAGNOSIS — C661 Malignant neoplasm of right ureter: Secondary | ICD-10-CM

## 2020-03-08 DIAGNOSIS — Z7982 Long term (current) use of aspirin: Secondary | ICD-10-CM | POA: Insufficient documentation

## 2020-03-08 DIAGNOSIS — Z5112 Encounter for antineoplastic immunotherapy: Secondary | ICD-10-CM | POA: Diagnosis not present

## 2020-03-08 DIAGNOSIS — R5381 Other malaise: Secondary | ICD-10-CM | POA: Insufficient documentation

## 2020-03-08 DIAGNOSIS — E785 Hyperlipidemia, unspecified: Secondary | ICD-10-CM | POA: Insufficient documentation

## 2020-03-08 DIAGNOSIS — J449 Chronic obstructive pulmonary disease, unspecified: Secondary | ICD-10-CM | POA: Insufficient documentation

## 2020-03-08 DIAGNOSIS — F17210 Nicotine dependence, cigarettes, uncomplicated: Secondary | ICD-10-CM | POA: Insufficient documentation

## 2020-03-08 DIAGNOSIS — Z79899 Other long term (current) drug therapy: Secondary | ICD-10-CM | POA: Insufficient documentation

## 2020-03-08 DIAGNOSIS — B182 Chronic viral hepatitis C: Secondary | ICD-10-CM | POA: Insufficient documentation

## 2020-03-08 DIAGNOSIS — F418 Other specified anxiety disorders: Secondary | ICD-10-CM | POA: Diagnosis not present

## 2020-03-08 DIAGNOSIS — D509 Iron deficiency anemia, unspecified: Secondary | ICD-10-CM | POA: Diagnosis not present

## 2020-03-08 DIAGNOSIS — Z515 Encounter for palliative care: Secondary | ICD-10-CM

## 2020-03-08 DIAGNOSIS — K219 Gastro-esophageal reflux disease without esophagitis: Secondary | ICD-10-CM | POA: Diagnosis not present

## 2020-03-08 DIAGNOSIS — Z85038 Personal history of other malignant neoplasm of large intestine: Secondary | ICD-10-CM | POA: Insufficient documentation

## 2020-03-08 DIAGNOSIS — Z8042 Family history of malignant neoplasm of prostate: Secondary | ICD-10-CM | POA: Insufficient documentation

## 2020-03-08 DIAGNOSIS — Z9221 Personal history of antineoplastic chemotherapy: Secondary | ICD-10-CM | POA: Insufficient documentation

## 2020-03-08 DIAGNOSIS — Z7189 Other specified counseling: Secondary | ICD-10-CM

## 2020-03-08 DIAGNOSIS — R5383 Other fatigue: Secondary | ICD-10-CM | POA: Diagnosis not present

## 2020-03-08 LAB — COMPREHENSIVE METABOLIC PANEL
ALT: 26 U/L (ref 0–44)
AST: 32 U/L (ref 15–41)
Albumin: 3.8 g/dL (ref 3.5–5.0)
Alkaline Phosphatase: 127 U/L — ABNORMAL HIGH (ref 38–126)
Anion gap: 9 (ref 5–15)
BUN: 18 mg/dL (ref 8–23)
CO2: 25 mmol/L (ref 22–32)
Calcium: 8.8 mg/dL — ABNORMAL LOW (ref 8.9–10.3)
Chloride: 103 mmol/L (ref 98–111)
Creatinine, Ser: 0.85 mg/dL (ref 0.61–1.24)
GFR calc Af Amer: >60 mL/min (ref >60)
GFR calc non Af Amer: >60 mL/min (ref >60)
Glucose, Bld: 88 mg/dL (ref 70–99)
Potassium: 4.4 mmol/L (ref 3.5–5.1)
Sodium: 137 mmol/L (ref 135–145)
Total Bilirubin: 0.6 mg/dL (ref 0.3–1.2)
Total Protein: 8 g/dL (ref 6.5–8.1)

## 2020-03-08 LAB — CBC WITH DIFFERENTIAL/PLATELET
Abs Immature Granulocytes: 0.01 10*3/uL (ref 0.00–0.07)
Basophils Absolute: 0.1 10*3/uL (ref 0.0–0.1)
Basophils Relative: 1 %
Eosinophils Absolute: 0.4 10*3/uL (ref 0.0–0.5)
Eosinophils Relative: 5 %
HCT: 41.9 % (ref 39.0–52.0)
Hemoglobin: 13.9 g/dL (ref 13.0–17.0)
Immature Granulocytes: 0 %
Lymphocytes Relative: 19 %
Lymphs Abs: 1.5 10*3/uL (ref 0.7–4.0)
MCH: 27 pg (ref 26.0–34.0)
MCHC: 33.2 g/dL (ref 30.0–36.0)
MCV: 81.4 fL (ref 80.0–100.0)
Monocytes Absolute: 0.6 10*3/uL (ref 0.1–1.0)
Monocytes Relative: 8 %
Neutro Abs: 5 10*3/uL (ref 1.7–7.7)
Neutrophils Relative %: 67 %
Platelets: 171 10*3/uL (ref 150–400)
RBC: 5.15 MIL/uL (ref 4.22–5.81)
RDW: 16.4 % — ABNORMAL HIGH (ref 11.5–15.5)
WBC: 7.6 10*3/uL (ref 4.0–10.5)
nRBC: 0 % (ref 0.0–0.2)

## 2020-03-08 MED ORDER — SODIUM CHLORIDE 0.9 % IV SOLN
200.0000 mg | Freq: Once | INTRAVENOUS | Status: AC
  Administered 2020-03-08: 200 mg via INTRAVENOUS
  Filled 2020-03-08: qty 8

## 2020-03-08 MED ORDER — HEPARIN SOD (PORK) LOCK FLUSH 100 UNIT/ML IV SOLN
500.0000 [IU] | Freq: Once | INTRAVENOUS | Status: AC
  Administered 2020-03-08: 500 [IU] via INTRAVENOUS
  Filled 2020-03-08: qty 5

## 2020-03-08 MED ORDER — SODIUM CHLORIDE 0.9% FLUSH
10.0000 mL | INTRAVENOUS | Status: DC | PRN
  Administered 2020-03-08: 10 mL via INTRAVENOUS
  Filled 2020-03-08: qty 10

## 2020-03-08 MED ORDER — HEPARIN SOD (PORK) LOCK FLUSH 100 UNIT/ML IV SOLN
INTRAVENOUS | Status: AC
  Filled 2020-03-08: qty 5

## 2020-03-08 MED ORDER — SODIUM CHLORIDE 0.9 % IV SOLN
Freq: Once | INTRAVENOUS | Status: AC
  Filled 2020-03-08: qty 250

## 2020-03-08 MED ORDER — HEPARIN SOD (PORK) LOCK FLUSH 100 UNIT/ML IV SOLN
500.0000 [IU] | Freq: Once | INTRAVENOUS | Status: DC | PRN
  Filled 2020-03-08: qty 5

## 2020-03-08 NOTE — Assessment & Plan Note (Addendum)
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 14th  May 2021 C/A/P-stable; Slight ureteral thickening/small pelvic/retroperitoneal lymph nodes.  No lesions in the liver. STABLE.   # Proceed with [place] today; Labs today reviewed;  acceptable for treatment today.  Discussed with patient that treatments are indefinite.  will order CT scan at next visit.   # Iron deficiency anemia-hemoglobin 13-improved. STABLE.   # Bilateral LE ulcers-s/p wound care evaluation; s/p PTCA with Dr.Dew. bil Arterial Dopp-wnl. STABLE;   #DISPOSITION: Chad [name] today;  # follow up in 3 weeks -MD labs-cbc/cmp; Keytruda/- Dr.B

## 2020-03-08 NOTE — Progress Notes (Signed)
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; # Colon ca- stage III  GOALS: palliative  CURRENT/MOST RECENT THERAPY: Keytruda [C]    Urothelial cancer ([place])   Initial Diagnosis   Urothelial cancer ([place])   Ureteral cancer, right ([name])  05/26/2018 Initial Diagnosis   Ureteral cancer, right ([place])   06/21/2018 -  Chemotherapy   The patient had pembrolizumab (KEYTRUDA) 200 mg in sodium chloride 0.9 % 50 mL chemo infusion, 200 mg, Intravenous, Once, 24 of 25 cycles Administration: 200 mg (06/21/2018), 200 mg (07/12/2018), 200 mg (08/02/2018), 200 mg (08/23/2018), 200 mg (09/13/2018), 200 mg (10/04/2018), 200 mg (10/25/2018), 200 mg (11/22/2018), 200 mg (12/13/2018), 200 mg (01/05/2019), 200 mg (01/24/2019), 200 mg (02/14/2019), 200 mg (03/11/2019), 200 mg (06/17/2019), 200 mg (08/26/2019), 200 mg (09/16/2019), 200 mg (10/07/2019), 200 mg (10/28/2019), 200 mg (11/24/2019), 200 mg (12/15/2019), 200 mg (01/05/2020), 200 mg (01/26/2020), 200 mg (02/16/2020), 200 mg (03/08/2020)  for chemotherapy treatment.       HISTORY OF PRESENTING ILLNESS: Patient is a poor historian.  Is alone.  Chad [name] 59 y.o.  male above history of stage IV-ureteral cancer/with multiple co-morbidities currently on Keytruda is here for follow-up.  Appetite is good.  No weight loss.  Chronic shortness of breath.  Chronic cough.  No diarrhea.  No skin rash.  No blood in urine.   Review  of Systems  Constitutional: Positive for malaise/fatigue. Negative for chills, diaphoresis, fever and weight loss.  HENT: Negative for nosebleeds and sore throat.   Eyes: Negative for double vision.  Respiratory: Positive for cough and shortness of breath. Negative for hemoptysis and wheezing.   Cardiovascular: Negative for chest pain, palpitations and orthopnea.  Gastrointestinal: Negative for abdominal pain, blood in stool, constipation, diarrhea, heartburn, melena, nausea and vomiting.  Genitourinary: Negative for dysuria, frequency and urgency.  Musculoskeletal: Positive for back pain and joint pain.  Skin: Negative.  Negative for itching and rash.  Neurological: Positive for focal weakness. Negative for dizziness, tingling, weakness and headaches.       Chronic left-sided weakness upper than lower extremity.  Endo/Heme/Allergies: Does not bruise/bleed easily.  Psychiatric/Behavioral: Negative for depression. The patient is not nervous/anxious and does not have insomnia.      MEDICAL HISTORY:  Past Medical History:  Diagnosis Date  . Anemia   . Anxiety   . ARF (acute respiratory failure) ([place])   . Bladder cancer ([place])   . Cancer [facility])    prostate  . COPD (chronic obstructive pulmonary disease) ([place])   . Depression   . Dysphagia   . Family history of colon cancer   . Family history of kidney cancer   . Family history of leukemia   . Family history of prostate cancer   . GERD (gastroesophageal reflux disease)   . Hepatitis    chronic hep c  . Hydronephrosis   . Hydronephrosis with ureteral stricture   . Hyperlipidemia   . Knee pain  Left  . Malignant neoplasm of colon ([place])   . Nerve pain   . Peripheral vascular disease ([place])   . Stroke ([place])   . Urinary frequency   . Venous hypertension of both lower extremities     SURGICAL HISTORY: Past Surgical History:  Procedure Laterality Date  . COLON SURGERY     En bloc extended right hemicolectomy 07/2017  .  CYSTOSCOPY W/ RETROGRADES Right 08/30/2018   Procedure: CYSTOSCOPY WITH RETROGRADE PYELOGRAM;  Surgeon: Hollice Espy, MD;  Location: [facility];  Service: Urology;  Laterality: Right;  . CYSTOSCOPY WITH STENT PLACEMENT Right 04/25/2018   Procedure: CYSTOSCOPY WITH STENT PLACEMENT;  Surgeon: Hollice Espy, MD;  Location: [facility];  Service: Urology;  Laterality: Right;  . CYSTOSCOPY WITH STENT PLACEMENT Right 08/30/2018   Procedure: [place] WITH STENT Exchange;  Surgeon: Hollice Espy, MD;  Location: [facility];  Service: Urology;  Laterality: Right;  . CYSTOSCOPY WITH STENT PLACEMENT Right 03/07/2019   Procedure: CYSTOSCOPY WITH STENT Exchange;  Surgeon: Hollice Espy, MD;  Location: [facility];  Service: Urology;  Laterality: Right;  . CYSTOSCOPY WITH STENT PLACEMENT Right 11/21/2019   Procedure: CYSTOSCOPY WITH STENT Exchange;  Surgeon: Hollice Espy, MD;  Location: [facility];  Service: Urology;  Laterality: Right;  . LOWER EXTREMITY ANGIOGRAPHY Left 05/23/2019   Procedure: LOWER EXTREMITY ANGIOGRAPHY;  Surgeon: Algernon Huxley, MD;  Location: [facility];  Service: Cardiovascular;  Laterality: Left;  . LOWER EXTREMITY ANGIOGRAPHY Right 05/30/2019   Procedure: LOWER EXTREMITY ANGIOGRAPHY;  Surgeon: Algernon Huxley, MD;  Location: [facility];  Service: Cardiovascular;  Laterality: Right;  . LOWER EXTREMITY ANGIOGRAPHY Right 02/13/2020   Procedure: LOWER EXTREMITY ANGIOGRAPHY;  Surgeon: Algernon Huxley, MD;  Location: [facility];  Service: Cardiovascular;  Laterality: Right;  . LOWER EXTREMITY ANGIOGRAPHY Left 02/20/2020   Procedure: LOWER EXTREMITY ANGIOGRAPHY;  Surgeon: Algernon Huxley, MD;  Location: [facility];  Service: Cardiovascular;  Laterality: Left;  . PORTA CATH INSERTION N/A 02/28/2019   Procedure: PORTA CATH INSERTION;  Surgeon: Algernon Huxley, MD;  Location: [facility];  Service: Cardiovascular;  Laterality: N/A;  . tumor removed       SOCIAL  HISTORY: Social History   Socioeconomic History  . Marital status: Single    Spouse name: Not on file  . Number of children: Not on file  . Years of education: Not on file  . Highest education level: Not on file  Occupational History  . Not on file  Tobacco Use  . Smoking status: Current Every Day Smoker    Packs/day: 1.00    Types: Cigarettes  . Smokeless tobacco: Never Used  Vaping Use  . Vaping Use: Never used  Substance and Sexual Activity  . Alcohol use: Not Currently  . Drug use: Not Currently  . Sexual activity: Not Currently  Other Topics Concern  . Not on file  Social History Narrative    used to live [place]; moved  To [place]- end of April 2019; in Nursing home; 1pp/day; quit alcohol. 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        Family- dad- prostate ca [at 90y]; brother- 72 died of prostate cancer; brother- 52- no cancers [[place]]; sonGerald Stabs [[place]];[name]-32y prostate ca [facility] [place]]; daughter- 30 [[place]]; another daughter 32 [[place]/addict]. will refer genetics counseling. Given MSI- abnormal; highly suspicious of Lynch syndrome.  Patient's son [name] aware of high possible lynch syndrome.   Social Determinants  of Health   Financial Resource Strain:   . Difficulty of Paying Living Expenses:   Food Insecurity:   . Worried About Charity fundraiser in the Last Year:   . Arboriculturist in the Last Year:   Transportation Needs:   . Film/video editor (Medical):   Marland Kitchen Lack of Transportation (Non-Medical):   Physical Activity:   . Days of Exercise per Week:   . Minutes of Exercise per Session:   Stress:   . Feeling of Stress :   Social Connections:   . Frequency of Communication with Friends and Family:   . Frequency of Social Gatherings with Friends and Family:   . Attends Religious Services:   . Active Member of Clubs or Organizations:   . Attends Archivist Meetings:   Marland Kitchen Marital Status:   Intimate Partner Violence:   . Fear of Current or Ex-Partner:   .  Emotionally Abused:   Marland Kitchen Physically Abused:   . Sexually Abused:     FAMILY HISTORY: Family History  Problem Relation Age of Onset  . Prostate cancer Father 51  . Cancer Brother 38       unsure type  . Cancer Paternal Uncle        unsure type  . Cancer Maternal Grandmother        unsure type  . Cancer Paternal Grandmother        unsure type  . Kidney cancer Paternal Grandfather   . Cancer Other        unsure types  . Leukemia Son   . Cancer Son        other cancers, possibly colon    ALLERGIES:  is allergic to penicillins.  MEDICATIONS:  Current Outpatient Medications  Medication Sig Dispense Refill  . acetaminophen (TYLENOL) 325 MG tablet Take 650 mg by mouth 3 (three) times daily.     Marland Kitchen aspirin EC 81 MG tablet Take 1 tablet (81 mg total) by mouth daily. 150 tablet 2  . atorvastatin (LIPITOR) 10 MG tablet Take 1 tablet (10 mg total) by mouth daily. 30 tablet 11  . clopidogrel (PLAVIX) 75 MG tablet Take 1 tablet (75 mg total) by mouth daily. 30 tablet 11  . collagenase (SANTYL) ointment Apply 1 application topically daily. LEFT LOWER LEG    . cyclobenzaprine (FLEXERIL) 10 MG tablet Take 10 mg by mouth at bedtime.    . diclofenac sodium (VOLTAREN) 1 % GEL Apply 2 g topically See admin instructions. Apply 2 g topically 4 times daily. Apply 2 g to left shoulder, apply 2 g to left hip and 2 g to left knee 4 times daily per [facility] instructions    . escitalopram (LEXAPRO) 5 MG tablet Take 5 mg by mouth daily.     Marland Kitchen gabapentin (NEURONTIN) 100 MG capsule Take 100 mg by mouth 3 (three) times daily.    . mirtazapine (REMERON) 7.5 MG tablet Take 7.5 mg by mouth at bedtime.    Marland Kitchen oxycodone (OXY-IR) 5 MG capsule Take 10 mg by mouth every 6 (six) hours as needed for pain.     . pantoprazole (PROTONIX) 40 MG tablet Take 40 mg by mouth daily.     No current facility-administered medications for this visit.   Facility-Administered Medications Ordered in Other Visits  Medication Dose Route  Frequency Provider Last Rate Last Admin  . heparin lock flush 100 unit/mL  500 Units Intracatheter Once PRN Cammie Sickle, MD      .  sodium chloride flush (NS) 0.9 % injection 10 mL  10 mL Intravenous PRN Cammie Sickle, MD   10 mL at 03/08/20 0905      .  PHYSICAL EXAMINATION: ECOG PERFORMANCE STATUS: 1 - Symptomatic but completely ambulatory  Vitals:   03/08/20 0914  BP: 128/86  Pulse: 70  Resp: 16  Temp: (!) 95.7 F (35.4 C)  SpO2: 100%   Filed Weights   03/08/20 0914  Weight: 175 lb (79.4 kg)    Physical Exam Constitutional:      Comments: In wheelchair. Cachectic.  Appears older than his stated age.  Alone.  HENT:     Head: Normocephalic and atraumatic.     Mouth/Throat:     Pharynx: No oropharyngeal exudate.  Eyes:     Pupils: Pupils are equal, round, and reactive to light.  Cardiovascular:     Rate and Rhythm: Normal rate and regular rhythm.  Pulmonary:     Effort: No respiratory distress.     Breath sounds: No wheezing.  Abdominal:     General: Bowel sounds are normal. There is no distension.     Palpations: Abdomen is soft. There is no mass.     Tenderness: There is no abdominal tenderness. There is no guarding or rebound.  Musculoskeletal:        General: No tenderness. Normal range of motion.     Cervical back: Normal range of motion and neck supple.  Skin:    General: Skin is warm.     Comments: Bilateral lower extremity ulcerations noted.  Pulses intact bilaterally.  Neurological:     Mental Status: He is oriented to person, place, and time.     Comments: Sleepy but easily arousable.  Chronic weakness left upper and lower extremity.  Psychiatric:        Mood and Affect: Affect normal.      LABORATORY DATA:  I have reviewed the data as listed Lab Results  Component Value Date   WBC 7.6 03/08/2020   HGB 13.9 03/08/2020   HCT 41.9 03/08/2020   MCV 81.4 03/08/2020   PLT 171 03/08/2020   Recent Labs    01/26/20 0834  01/26/20 0834 02/16/20 0855 02/20/20 0951 03/08/20 0905  NA 139  --  138  --  137  K 4.2  --  4.1  --  4.4  CL 104  --  104  --  103  CO2 27  --  26  --  25  GLUCOSE 94  --  88  --  88  BUN 20   < > '21 17 18  ' CREATININE 0.93   < > 0.85 0.70 0.85  CALCIUM 8.8*  --  8.9  --  8.8*  GFRNONAA >60   < > >60 >60 >60  GFRAA >60   < > >60 >60 >60  PROT 8.0  --  8.2*  --  8.0  ALBUMIN 3.9  --  4.0  --  3.8  AST 31  --  29  --  32  ALT 26  --  21  --  26  ALKPHOS 125  --  109  --  127*  BILITOT 0.6  --  0.7  --  0.6   < > = values in this interval not displayed.    RADIOGRAPHIC STUDIES: I have personally reviewed the radiological images as listed and agreed with the findings in the report. 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 14th  May 2021 C/A/P-stable; Slight ureteral thickening/small pelvic/retroperitoneal lymph nodes.  No lesions in the liver. STABLE.   # Proceed with [place] today; Labs today reviewed;  acceptable for treatment today.  Discussed with patient that treatments are indefinite.  will order CT scan at next visit.   # Iron deficiency anemia-hemoglobin 13-improved. STABLE.   # Bilateral LE ulcers-s/p wound care evaluation; s/p PTCA with Dr.Dew. bil Arterial Dopp-wnl. STABLE;   #DISPOSITION: # [name] today;  # follow up in 3 weeks -MD labs-cbc/cmp; Keytruda/- Dr.B     All questions were answered. The patient knows to call the clinic with any problems, questions or concerns.    Cammie Sickle, MD 03/08/2020 12:15 PM

## 2020-03-12 ENCOUNTER — Other Ambulatory Visit (INDEPENDENT_AMBULATORY_CARE_PROVIDER_SITE_OTHER): Payer: Self-pay | Admitting: Vascular Surgery

## 2020-03-12 DIAGNOSIS — Z9862 Peripheral vascular angioplasty status: Secondary | ICD-10-CM

## 2020-03-12 DIAGNOSIS — I70239 Atherosclerosis of native arteries of right leg with ulceration of unspecified site: Secondary | ICD-10-CM

## 2020-03-13 ENCOUNTER — Encounter (INDEPENDENT_AMBULATORY_CARE_PROVIDER_SITE_OTHER): Payer: Self-pay | Admitting: Vascular Surgery

## 2020-03-13 ENCOUNTER — Ambulatory Visit (INDEPENDENT_AMBULATORY_CARE_PROVIDER_SITE_OTHER): Payer: Medicaid Other | Admitting: Vascular Surgery

## 2020-03-13 ENCOUNTER — Encounter (INDEPENDENT_AMBULATORY_CARE_PROVIDER_SITE_OTHER): Payer: Medicaid Other | Admitting: Nurse Practitioner

## 2020-03-13 ENCOUNTER — Other Ambulatory Visit: Payer: Self-pay

## 2020-03-13 ENCOUNTER — Ambulatory Visit (INDEPENDENT_AMBULATORY_CARE_PROVIDER_SITE_OTHER): Payer: Medicaid Other

## 2020-03-13 VITALS — BP 128/87 | HR 77 | Resp 16

## 2020-03-13 DIAGNOSIS — L97909 Non-pressure chronic ulcer of unspecified part of unspecified lower leg with unspecified severity: Secondary | ICD-10-CM

## 2020-03-13 DIAGNOSIS — E43 Unspecified severe protein-calorie malnutrition: Secondary | ICD-10-CM

## 2020-03-13 DIAGNOSIS — Z9862 Peripheral vascular angioplasty status: Secondary | ICD-10-CM | POA: Diagnosis not present

## 2020-03-13 DIAGNOSIS — I70239 Atherosclerosis of native arteries of right leg with ulceration of unspecified site: Secondary | ICD-10-CM

## 2020-03-13 DIAGNOSIS — I739 Peripheral vascular disease, unspecified: Secondary | ICD-10-CM

## 2020-03-13 NOTE — Assessment & Plan Note (Signed)
This impairs wound healing.  Promoting increased caloric intake should help wound healing.

## 2020-03-13 NOTE — Assessment & Plan Note (Signed)
His ABIs today are 0.97 on the right and 1.27 on the left with decent waveforms.  His digital pressure is reduced on the right at 56 but normal on the left at 103.  From a perfusion standpoint, he has adequate flow for wound healing at this point.  Continue local wound care.  Continue current medical regimen.  As long as he has wounds we will keep a short interval follow-up and I will plan to see him back in 3 months.

## 2020-03-13 NOTE — Progress Notes (Signed)
MRN : [id number]  Chad [name] is a 59 y.o. (1960-11-01) male who presents with chief complaint of  Chief Complaint  Patient presents with  . Follow-up    [facility] 4wk post le angio  .  History of Present Illness: Patient returns today in follow up of his PAD with ulceration.  He still has a persistent ulcer on the medial left ankle and the top of the right foot.  These are both granulating well and seem to be healing although they are still significant in size.  His multiple other ulcerations have now healed.  He has recently undergone staged bilateral lower extremity revascularizations with no periprocedural complications.  His ABIs today are 0.97 on the right and 1.27 on the left with decent waveforms.  His digital pressure is reduced on the right at 56 but normal on the left at 103.  Current Outpatient Medications  Medication Sig Dispense Refill  . acetaminophen (TYLENOL) 325 MG tablet Take 650 mg by mouth 3 (three) times daily.     Marland Kitchen aspirin EC 81 MG tablet Take 1 tablet (81 mg total) by mouth daily. 150 tablet 2  . atorvastatin (LIPITOR) 10 MG tablet Take 1 tablet (10 mg total) by mouth daily. 30 tablet 11  . clopidogrel (PLAVIX) 75 MG tablet Take 1 tablet (75 mg total) by mouth daily. 30 tablet 11  . collagenase (SANTYL) ointment Apply 1 application topically daily. LEFT LOWER LEG    . cyclobenzaprine (FLEXERIL) 10 MG tablet Take 10 mg by mouth at bedtime.    . diclofenac sodium (VOLTAREN) 1 % GEL Apply 2 g topically See admin instructions. Apply 2 g topically 4 times daily. Apply 2 g to left shoulder, apply 2 g to left hip and 2 g to left knee 4 times daily per [facility] instructions    . escitalopram (LEXAPRO) 5 MG tablet Take 5 mg by mouth daily.     Marland Kitchen gabapentin (NEURONTIN) 100 MG capsule Take 100 mg by mouth 3 (three) times daily.    . mirtazapine (REMERON) 7.5 MG tablet Take 7.5 mg by mouth at bedtime.    Marland Kitchen oxycodone (OXY-IR) 5 MG capsule Take 10 mg by mouth every 6 (six) hours as  needed for pain.     . pantoprazole (PROTONIX) 40 MG tablet Take 40 mg by mouth daily.     No current facility-administered medications for this visit.    Past Medical History:  Diagnosis Date  . Anemia   . Anxiety   . ARF (acute respiratory failure) ([place])   . Bladder cancer ([place])   . Cancer [facility])    prostate  . COPD (chronic obstructive pulmonary disease) ([place])   . Depression   . Dysphagia   . Family history of colon cancer   . Family history of kidney cancer   . Family history of leukemia   . Family history of prostate cancer   . GERD (gastroesophageal reflux disease)   . Hepatitis    chronic hep c  . Hydronephrosis   . Hydronephrosis with ureteral stricture   . Hyperlipidemia   . Knee pain    Left  . Malignant neoplasm of colon ([place])   . Nerve pain   . Peripheral vascular disease ([place])   . Stroke ([place])   . Urinary frequency   . Venous hypertension of both lower extremities     Past Surgical History:  Procedure Laterality Date  . COLON SURGERY     En bloc extended right hemicolectomy  07/2017  . CYSTOSCOPY W/ RETROGRADES Right 08/30/2018   Procedure: CYSTOSCOPY WITH RETROGRADE PYELOGRAM;  Surgeon: Hollice Espy, MD;  Location: [facility] ORS;  Service: Urology;  Laterality: Right;  . CYSTOSCOPY WITH STENT PLACEMENT Right 04/25/2018   Procedure: CYSTOSCOPY WITH STENT PLACEMENT;  Surgeon: Hollice Espy, MD;  Location: [facility] ORS;  Service: Urology;  Laterality: Right;  . CYSTOSCOPY WITH STENT PLACEMENT Right 08/30/2018   Procedure: [place] WITH STENT Exchange;  Surgeon: Hollice Espy, MD;  Location: [facility] ORS;  Service: Urology;  Laterality: Right;  . CYSTOSCOPY WITH STENT PLACEMENT Right 03/07/2019   Procedure: CYSTOSCOPY WITH STENT Exchange;  Surgeon: Hollice Espy, MD;  Location: [facility] ORS;  Service: Urology;  Laterality: Right;  . CYSTOSCOPY WITH STENT PLACEMENT Right 11/21/2019   Procedure: CYSTOSCOPY WITH STENT Exchange;  Surgeon: Hollice Espy, MD;  Location: [facility]  ORS;  Service: Urology;  Laterality: Right;  . LOWER EXTREMITY ANGIOGRAPHY Left 05/23/2019   Procedure: LOWER EXTREMITY ANGIOGRAPHY;  Surgeon: Algernon Huxley, MD;  Location: [place] CV LAB;  Service: Cardiovascular;  Laterality: Left;  . LOWER EXTREMITY ANGIOGRAPHY Right 05/30/2019   Procedure: LOWER EXTREMITY ANGIOGRAPHY;  Surgeon: Algernon Huxley, MD;  Location: [place] CV LAB;  Service: Cardiovascular;  Laterality: Right;  . LOWER EXTREMITY ANGIOGRAPHY Right 02/13/2020   Procedure: LOWER EXTREMITY ANGIOGRAPHY;  Surgeon: Algernon Huxley, MD;  Location: [place] CV LAB;  Service: Cardiovascular;  Laterality: Right;  . LOWER EXTREMITY ANGIOGRAPHY Left 02/20/2020   Procedure: LOWER EXTREMITY ANGIOGRAPHY;  Surgeon: Algernon Huxley, MD;  Location: [place] CV LAB;  Service: Cardiovascular;  Laterality: Left;  . PORTA CATH INSERTION N/A 02/28/2019   Procedure: PORTA CATH INSERTION;  Surgeon: Algernon Huxley, MD;  Location: [place] CV LAB;  Service: Cardiovascular;  Laterality: N/A;  . tumor removed        Social History   Tobacco Use  . Smoking status: Current Every Day Smoker    Packs/day: 1.00    Types: Cigarettes  . Smokeless tobacco: Never Used  Vaping Use  . Vaping Use: Never used  Substance Use Topics  . Alcohol use: Not Currently  . Drug use: Not Currently      Family History  Problem Relation Age of Onset  . Prostate cancer Father 77  . Cancer Brother 26       unsure type  . Cancer Paternal Uncle        unsure type  . Cancer Maternal Grandmother        unsure type  . Cancer Paternal Grandmother        unsure type  . Kidney cancer Paternal Grandfather   . Cancer Other        unsure types  . Leukemia Son   . Cancer Son        other cancers, possibly colon     Allergies  Allergen Reactions  . Penicillins Rash     REVIEW OF SYSTEMS (Negative unless checked)  Constitutional: [] Weight loss  [] Fever  [] Chills Cardiac: [] Chest pain   [] Chest pressure    [] Palpitations   [] Shortness of breath when laying flat   [] Shortness of breath at rest   [] Shortness of breath with exertion. Vascular:  [] Pain in legs with walking   [] Pain in legs at rest   [] Pain in legs when laying flat   [] Claudication   [] Pain in feet when walking  [] Pain in feet at rest  [] Pain in feet when laying flat   [] History of DVT   []   Phlebitis   [] Swelling in legs   [] Varicose veins   [x] Non-healing ulcers Pulmonary:   [] Uses home oxygen   [] Productive cough   [] Hemoptysis   [] Wheeze  [] COPD   [] Asthma Neurologic:  [] Dizziness  [] Blackouts   [] Seizures   [] History of stroke   [] History of TIA  [] Aphasia   [] Temporary blindness   [] Dysphagia   [] Weakness or numbness in arms   [] Weakness or numbness in legs Musculoskeletal:  [x] Arthritis   [] Joint swelling   [x] Joint pain   [] Low back pain Hematologic:  [] Easy bruising  [] Easy bleeding   [] Hypercoagulable state   [] Anemic   Gastrointestinal:  [] Blood in stool   [] Vomiting blood  [] Gastroesophageal reflux/heartburn   [] Abdominal pain Genitourinary:  [] Chronic kidney disease   [] Difficult urination  [] Frequent urination  [] Burning with urination   [] Hematuria Skin:  [] Rashes   [x] Ulcers   [x] Wounds Psychological:  [] History of anxiety   []  History of major depression.  Physical Examination  BP 128/87 (BP Location: Right Arm)   Pulse 77   Resp 16  Gen:  WD/WN, NAD.  Debilitated appearing.  Appears much older than stated age Head: [place]/AT, No temporalis wasting. Ear/Nose/Throat: Hearing grossly intact, nares w/o erythema or drainage Eyes: Conjunctiva clear. Sclera non-icteric Neck: Supple.  Trachea midline Pulmonary:  Good air movement, no use of accessory muscles.  Cardiac: Somewhat irregular Vascular:  Vessel Right Left  Radial Palpable Palpable                          PT  1+ palpable  1+ palpable  DP  1+ palpable  2+ palpable    Musculoskeletal: M/S 5/5 throughout.  No deformity or atrophy.  Ulcerations on the top of  the right foot and the medial left ankle area with reasonably good granulation tissue no surrounding erythema or drainage.  Mild bilateral lower extremity edema. Neurologic: Sensation grossly intact in extremities.  Symmetrical.  Speech is fluent.  Psychiatric: Judgment intact, Mood & affect appropriate for pt's clinical situation. Dermatologic: Bilateral lower extremity wounds as described above       Labs Recent Results (from the past 2160 hour(s))  Comprehensive metabolic panel     Status: Abnormal   Collection Time: 12/15/19  9:01 AM  Result Value Ref Range   Sodium 138 135 - 145 mmol/L   Potassium 4.1 3.5 - 5.1 mmol/L   Chloride 104 98 - 111 mmol/L   CO2 25 22 - 32 mmol/L   Glucose, Bld 85 70 - 99 mg/dL    Comment: Glucose reference range applies only to samples taken after fasting for at least 8 hours.   BUN 22 8 - 23 mg/dL   Creatinine, Ser 0.89 0.61 - 1.24 mg/dL   Calcium 8.5 (L) 8.9 - 10.3 mg/dL   Total Protein 7.3 6.5 - 8.1 g/dL   Albumin 3.6 3.5 - 5.0 g/dL   AST 30 15 - 41 U/L   ALT 25 0 - 44 U/L   Alkaline Phosphatase 128 (H) 38 - 126 U/L   Total Bilirubin 0.5 0.3 - 1.2 mg/dL   GFR calc non Af Amer >60 >60 mL/min   GFR calc Af Amer >60 >60 mL/min   Anion gap 9 5 - 15    Comment: Performed at [facility], [address] [place], [place] [address]  CBC with Differential     Status: Abnormal   Collection Time: 12/15/19  9:01 AM  Result Value Ref  Range   WBC 10.7 (H) 4.0 - 10.5 K/uL   RBC 5.23 4.22 - 5.81 MIL/uL   Hemoglobin 12.7 (L) 13.0 - 17.0 g/dL   HCT 40.5 39 - 52 %   MCV 77.4 (L) 80.0 - 100.0 fL   MCH 24.3 (L) 26.0 - 34.0 pg   MCHC 31.4 30.0 - 36.0 g/dL   RDW 20.4 (H) 11.5 - 15.5 %   Platelets 174 150 - 400 K/uL   nRBC 0.0 0.0 - 0.2 %   Neutrophils Relative % 69 %   Neutro Abs 7.4 1.7 - 7.7 K/uL   Lymphocytes Relative 18 %   Lymphs Abs 1.9 0.7 - 4.0 K/uL   Monocytes Relative 7 %   Monocytes Absolute 0.8 0 - 1 K/uL   Eosinophils Relative 5 %     Eosinophils Absolute 0.5 0 - 0 K/uL   Basophils Relative 1 %   Basophils Absolute 0.1 0 - 0 K/uL   Immature Granulocytes 0 %   Abs Immature Granulocytes 0.03 0.00 - 0.07 K/uL    Comment: Performed at [facility], [address] [place], [place] [address]  TSH     Status: None   Collection Time: 01/05/20  9:00 AM  Result Value Ref Range   TSH 3.117 0.350 - 4.500 uIU/mL    Comment: Performed by a 3rd Generation assay with a functional sensitivity of <=0.01 uIU/mL. Performed at [facility], [address] [place], [place] [address]   Comprehensive metabolic panel     Status: Abnormal   Collection Time: 01/05/20  9:00 AM  Result Value Ref Range   Sodium 139 135 - 145 mmol/L   Potassium 3.9 3.5 - 5.1 mmol/L   Chloride 107 98 - 111 mmol/L   CO2 24 22 - 32 mmol/L   Glucose, Bld 104 (H) 70 - 99 mg/dL    Comment: Glucose reference range applies only to samples taken after fasting for at least 8 hours.   BUN 22 8 - 23 mg/dL   Creatinine, Ser 0.98 0.61 - 1.24 mg/dL   Calcium 8.5 (L) 8.9 - 10.3 mg/dL   Total Protein 7.9 6.5 - 8.1 g/dL   Albumin 3.9 3.5 - 5.0 g/dL   AST 32 15 - 41 U/L   ALT 32 0 - 44 U/L   Alkaline Phosphatase 136 (H) 38 - 126 U/L   Total Bilirubin 0.5 0.3 - 1.2 mg/dL   GFR calc non Af Amer >60 >60 mL/min   GFR calc Af Amer >60 >60 mL/min   Anion gap 8 5 - 15    Comment: Performed at [facility], [address] [place], [place] [address]  CBC with Differential     Status: Abnormal   Collection Time: 01/05/20  9:00 AM  Result Value Ref Range   WBC 8.3 4.0 - 10.5 K/uL   RBC 5.28 4.22 - 5.81 MIL/uL   Hemoglobin 13.5 13.0 - 17.0 g/dL   HCT 41.9 39 - 52 %   MCV 79.4 (L) 80.0 - 100.0 fL   MCH 25.6 (L) 26.0 - 34.0 pg   MCHC 32.2 30.0 - 36.0 g/dL   RDW 20.6 (H) 11.5 - 15.5 %   Platelets 188 150 - 400 K/uL   nRBC 0.0 0.0 - 0.2 %   Neutrophils Relative % 64 %   Neutro Abs 5.3 1.7 - 7.7 K/uL   Lymphocytes Relative 21 %   Lymphs Abs 1.8 0.7  - 4.0 K/uL  Monocytes Relative 9 %   Monocytes Absolute 0.7 0 - 1 K/uL   Eosinophils Relative 5 %   Eosinophils Absolute 0.4 0 - 0 K/uL   Basophils Relative 1 %   Basophils Absolute 0.1 0 - 0 K/uL   Immature Granulocytes 0 %   Abs Immature Granulocytes 0.03 0.00 - 0.07 K/uL    Comment: Performed at [facility], [address] [place], [place] [address]  TSH     Status: None   Collection Time: 01/26/20  8:34 AM  Result Value Ref Range   TSH 1.358 0.350 - 4.500 uIU/mL    Comment: Performed by a 3rd Generation assay with a functional sensitivity of <=0.01 uIU/mL. Performed at [facility], [address] [place], [place] [address]   Comprehensive metabolic panel     Status: Abnormal   Collection Time: 01/26/20  8:34 AM  Result Value Ref Range   Sodium 139 135 - 145 mmol/L   Potassium 4.2 3.5 - 5.1 mmol/L   Chloride 104 98 - 111 mmol/L   CO2 27 22 - 32 mmol/L   Glucose, Bld 94 70 - 99 mg/dL    Comment: Glucose reference range applies only to samples taken after fasting for at least 8 hours.   BUN 20 8 - 23 mg/dL   Creatinine, Ser 0.93 0.61 - 1.24 mg/dL   Calcium 8.8 (L) 8.9 - 10.3 mg/dL   Total Protein 8.0 6.5 - 8.1 g/dL   Albumin 3.9 3.5 - 5.0 g/dL   AST 31 15 - 41 U/L   ALT 26 0 - 44 U/L   Alkaline Phosphatase 125 38 - 126 U/L   Total Bilirubin 0.6 0.3 - 1.2 mg/dL   GFR calc non Af Amer >60 >60 mL/min   GFR calc Af Amer >60 >60 mL/min   Anion gap 8 5 - 15    Comment: Performed at [facility], [address] [place], [place] [address]  CBC with Differential     Status: Abnormal   Collection Time: 01/26/20  8:34 AM  Result Value Ref Range   WBC 8.4 4.0 - 10.5 K/uL   RBC 5.28 4.22 - 5.81 MIL/uL   Hemoglobin 13.5 13.0 - 17.0 g/dL   HCT 42.1 39 - 52 %   MCV 79.7 (L) 80.0 - 100.0 fL   MCH 25.6 (L) 26.0 - 34.0 pg   MCHC 32.1 30.0 - 36.0 g/dL   RDW 18.5 (H) 11.5 - 15.5 %   Platelets 170 150 - 400 K/uL   nRBC 0.0 0.0 - 0.2 %   Neutrophils  Relative % 70 %   Neutro Abs 5.8 1.7 - 7.7 K/uL   Lymphocytes Relative 18 %   Lymphs Abs 1.5 0.7 - 4.0 K/uL   Monocytes Relative 7 %   Monocytes Absolute 0.6 0 - 1 K/uL   Eosinophils Relative 4 %   Eosinophils Absolute 0.4 0 - 0 K/uL   Basophils Relative 1 %   Basophils Absolute 0.1 0 - 0 K/uL   Immature Granulocytes 0 %   Abs Immature Granulocytes 0.02 0.00 - 0.07 K/uL    Comment: Performed at [facility], [place], [place] [address]  SARS CORONAVIRUS 2 (TAT 6-24 HRS) Nasopharyngeal Nasopharyngeal Swab     Status: None   Collection Time: 02/06/20  9:18 AM   Specimen: Nasopharyngeal Swab  Result Value Ref Range   SARS Coronavirus 2 NEGATIVE NEGATIVE    Comment: (NOTE) SARS-CoV-2 target  nucleic acids are NOT DETECTED.  The SARS-CoV-2 RNA is generally detectable in upper and lower respiratory specimens during the acute phase of infection. Negative results do not preclude SARS-CoV-2 infection, do not rule out co-infections with other pathogens, and should not be used as the sole basis for treatment or other patient management decisions. Negative results must be combined with clinical observations, patient history, and epidemiological information. The expected result is Negative.  Fact Sheet for Patients: SugarRoll.be  Fact Sheet for Healthcare Providers: https://www.woods-mathews.com/  This test is not yet approved or cleared by the [place] FDA and  has been authorized for detection and/or diagnosis of SARS-CoV-2 by FDA under an Emergency Use Authorization (EUA). This EUA will remain  in effect (meaning this test can be used) for the duration of the COVID-19 declaration under Se ction 564(b)(1) of the Act, 21 U.S.C. section 360bbb-3(b)(1), unless the authorization is terminated or revoked sooner.  Performed at [facility], [place] [address], [place], [place] [address]   Comprehensive metabolic panel      Status: Abnormal   Collection Time: 02/16/20  8:55 AM  Result Value Ref Range   Sodium 138 135 - 145 mmol/L   Potassium 4.1 3.5 - 5.1 mmol/L   Chloride 104 98 - 111 mmol/L   CO2 26 22 - 32 mmol/L   Glucose, Bld 88 70 - 99 mg/dL    Comment: Glucose reference range applies only to samples taken after fasting for at least 8 hours.   BUN 21 8 - 23 mg/dL   Creatinine, Ser 0.85 0.61 - 1.24 mg/dL   Calcium 8.9 8.9 - 10.3 mg/dL   Total Protein 8.2 (H) 6.5 - 8.1 g/dL   Albumin 4.0 3.5 - 5.0 g/dL   AST 29 15 - 41 U/L   ALT 21 0 - 44 U/L   Alkaline Phosphatase 109 38 - 126 U/L   Total Bilirubin 0.7 0.3 - 1.2 mg/dL   GFR calc non Af Amer >60 >60 mL/min   GFR calc Af Amer >60 >60 mL/min   Anion gap 8 5 - 15    Comment: Performed at [facility], [address] [place],  [address]  CBC with Differential     Status: Abnormal   Collection Time: 02/16/20  8:55 AM  Result Value Ref Range   WBC 8.9 4.0 - 10.5 K/uL   RBC 5.27 4.22 - 5.81 MIL/uL   Hemoglobin 14.0 13.0 - 17.0 g/dL   HCT 42.4 39 - 52 %   MCV 80.5 80.0 - 100.0 fL   MCH 26.6 26.0 - 34.0 pg   MCHC 33.0 30.0 - 36.0 g/dL   RDW 17.2 (H) 11.5 - 15.5 %   Platelets 195 150 - 400 K/uL   nRBC 0.0 0.0 - 0.2 %   Neutrophils Relative % 69 %   Neutro Abs 6.2 1.7 - 7.7 K/uL   Lymphocytes Relative 18 %   Lymphs Abs 1.6 0.7 - 4.0 K/uL   Monocytes Relative 7 %   Monocytes Absolute 0.6 0 - 1 K/uL   Eosinophils Relative 5 %   Eosinophils Absolute 0.5 0 - 0 K/uL   Basophils Relative 1 %   Basophils Absolute 0.1 0 - 0 K/uL   Immature Granulocytes 0 %   Abs Immature Granulocytes 0.03 0.00 - 0.07 K/uL    Comment: Performed at [facility], [place], [place] [address]  SARS CORONAVIRUS 2 (TAT 6-24 HRS) Nasopharyngeal Nasopharyngeal Swab  Status: None   Collection Time: 02/17/20 11:09 AM   Specimen: Nasopharyngeal Swab  Result Value Ref Range   SARS Coronavirus 2 NEGATIVE NEGATIVE    Comment:  (NOTE) SARS-CoV-2 target nucleic acids are NOT DETECTED.  The SARS-CoV-2 RNA is generally detectable in upper and lower respiratory specimens during the acute phase of infection. Negative results do not preclude SARS-CoV-2 infection, do not rule out co-infections with other pathogens, and should not be used as the sole basis for treatment or other patient management decisions. Negative results must be combined with clinical observations, patient history, and epidemiological information. The expected result is Negative.  Fact Sheet for Patients: SugarRoll.be  Fact Sheet for Healthcare Providers: https://www.woods-mathews.com/  This test is not yet approved or cleared by the [place] FDA and  has been authorized for detection and/or diagnosis of SARS-CoV-2 by FDA under an Emergency Use Authorization (EUA). This EUA will remain  in effect (meaning this test can be used) for the duration of the COVID-19 declaration under Se ction 564(b)(1) of the Act, 21 U.S.C. section 360bbb-3(b)(1), unless the authorization is terminated or revoked sooner.  Performed at [facility], [place] [address] [place], [place] [address]   BUN     Status: None   Collection Time: 02/20/20  9:51 AM  Result Value Ref Range   BUN 17 8 - 23 mg/dL    Comment: Performed at [facility], [address] [place], [place] [address]  Creatinine, serum     Status: None   Collection Time: 02/20/20  9:51 AM  Result Value Ref Range   Creatinine, Ser 0.70 0.61 - 1.24 mg/dL   GFR calc non Af Amer >60 >60 mL/min   GFR calc Af Amer >60 >60 mL/min    Comment: Performed at [facility], [address], [place], [place] [address]  Comprehensive metabolic panel     Status: Abnormal   Collection Time: 03/08/20  9:05 AM  Result Value Ref Range   Sodium 137 135 - 145 mmol/L   Potassium 4.4 3.5 - 5.1 mmol/L   Chloride 103 98 - 111 mmol/L   CO2 25 22 - 32  mmol/L   Glucose, Bld 88 70 - 99 mg/dL    Comment: Glucose reference range applies only to samples taken after fasting for at least 8 hours.   BUN 18 8 - 23 mg/dL   Creatinine, Ser 0.85 0.61 - 1.24 mg/dL   Calcium 8.8 (L) 8.9 - 10.3 mg/dL   Total Protein 8.0 6.5 - 8.1 g/dL   Albumin 3.8 3.5 - 5.0 g/dL   AST 32 15 - 41 U/L   ALT 26 0 - 44 U/L   Alkaline Phosphatase 127 (H) 38 - 126 U/L   Total Bilirubin 0.6 0.3 - 1.2 mg/dL   GFR calc non Af Amer >60 >60 mL/min   GFR calc Af Amer >60 >60 mL/min   Anion gap 9 5 - 15    Comment: Performed at [facility], [address] [place], [place] [address]  CBC with Differential     Status: Abnormal   Collection Time: 03/08/20  9:05 AM  Result Value Ref Range   WBC 7.6 4.0 - 10.5 K/uL   RBC 5.15 4.22 - 5.81 MIL/uL   Hemoglobin 13.9 13.0 - 17.0 g/dL   HCT 41.9 39 - 52 %   MCV 81.4 80.0 - 100.0 fL   MCH 27.0 26.0 - 34.0 pg   MCHC 33.2 30.0 -  36.0 g/dL   RDW 16.4 (H) 11.5 - 15.5 %   Platelets 171 150 - 400 K/uL   nRBC 0.0 0.0 - 0.2 %   Neutrophils Relative % 67 %   Neutro Abs 5.0 1.7 - 7.7 K/uL   Lymphocytes Relative 19 %   Lymphs Abs 1.5 0.7 - 4.0 K/uL   Monocytes Relative 8 %   Monocytes Absolute 0.6 0 - 1 K/uL   Eosinophils Relative 5 %   Eosinophils Absolute 0.4 0 - 0 K/uL   Basophils Relative 1 %   Basophils Absolute 0.1 0 - 0 K/uL   Immature Granulocytes 0 %   Abs Immature Granulocytes 0.01 0.00 - 0.07 K/uL    Comment: Performed at [facility], [address], [place], [place] [address]    Radiology PERIPHERAL VASCULAR CATHETERIZATION  Result Date: 02/20/2020 See op note  PERIPHERAL VASCULAR CATHETERIZATION  Result Date: 02/13/2020 See op note   Assessment/Plan  Peripheral vascular disease of lower extremity with ulceration ([place]) His ABIs today are 0.97 on the right and 1.27 on the left with decent waveforms.  His digital pressure is reduced on the right at 56 but normal on the left at 103.  From a  perfusion standpoint, he has adequate flow for wound healing at this point.  Continue local wound care.  Continue current medical regimen.  As long as he has wounds we will keep a short interval follow-up and I will plan to see him back in 3 months.  Protein-calorie malnutrition, severe This impairs wound healing.  Promoting increased caloric intake should help wound healing.    Leotis Pain, MD  03/13/2020 3:08 PM    This note was created with Dragon medical transcription system.  Any errors from dictation are purely unintentional

## 2020-03-13 NOTE — Patient Instructions (Signed)
Peripheral Vascular Disease  Peripheral vascular disease (PVD) is a disease of the blood vessels that are not part of your heart and brain. A simple term for PVD is poor circulation. In most cases, PVD narrows the blood vessels that carry blood from your heart to the rest of your body. This can reduce the supply of blood to your arms, legs, and internal organs, like your stomach or kidneys. However, PVD most often affects a person's lower legs and feet. Without treatment, PVD tends to get worse. PVD can also lead to acute ischemic limb. This is when an arm or leg suddenly cannot get enough blood. This is a medical emergency. Follow these instructions at home: Lifestyle  Do not use any products that contain nicotine or tobacco, such as cigarettes and e-cigarettes. If you need help quitting, ask your doctor.  Lose weight if you are overweight. Or, stay at a healthy weight as told by your doctor.  Eat a diet that is low in fat and cholesterol. If you need help, ask your doctor.  Exercise regularly. Ask your doctor for activities that are right for you. General instructions  Take over-the-counter and prescription medicines only as told by your doctor.  Take good care of your feet: ? Wear comfortable shoes that fit well. ? Check your feet often for any cuts or sores.  Keep all follow-up visits as told by your doctor This is important. Contact a doctor if:  You have cramps in your legs when you walk.  You have leg pain when you are at rest.  You have coldness in a leg or foot.  Your skin changes.  You are unable to get or have an erection (erectile dysfunction).  You have cuts or sores on your feet that do not heal. Get help right away if:  Your arm or leg turns cold, numb, and blue.  Your arms or legs become red, warm, swollen, painful, or numb.  You have chest pain.  You have trouble breathing.  You suddenly have weakness in your face, arm, or leg.  You become very  confused or you cannot speak.  You suddenly have a very bad headache.  You suddenly cannot see. Summary  Peripheral vascular disease (PVD) is a disease of the blood vessels.  A simple term for PVD is poor circulation. Without treatment, PVD tends to get worse.  Treatment may include exercise, low fat and low cholesterol diet, and quitting smoking. This information is not intended to replace advice given to you by your health care provider. Make sure you discuss any questions you have with your health care provider. Document Revised: 06/26/2017 Document Reviewed: 08/21/2016 Elsevier Patient Education  2020 [employer]  

## 2020-03-19 ENCOUNTER — Encounter: Payer: Self-pay | Admitting: Vascular Surgery

## 2020-03-28 ENCOUNTER — Other Ambulatory Visit: Payer: Self-pay

## 2020-03-28 ENCOUNTER — Encounter: Payer: Self-pay | Admitting: Nurse Practitioner

## 2020-03-28 ENCOUNTER — Non-Acute Institutional Stay: Payer: Medicaid Other | Admitting: Nurse Practitioner

## 2020-03-28 VITALS — BP 132/66 | HR 70 | Wt 172.5 lb

## 2020-03-28 DIAGNOSIS — Z515 Encounter for palliative care: Secondary | ICD-10-CM

## 2020-03-28 DIAGNOSIS — C661 Malignant neoplasm of right ureter: Secondary | ICD-10-CM

## 2020-03-28 NOTE — Progress Notes (Signed)
[facility] Consult Note Telephone: [phone]  Fax: [phone]  PATIENT NAME: Chad [name] DOB: 24-Jul-1961 MRN: [id number]  PROVIDER:Dr Hodges/E[facility] and PLAN: 1.ACP: DNR; treat what is treatable including hospitalizations if necessary  2.Anorexia secondary to protein calorie malnutritionrelated to cancerimprovingongoing.Continue to monitor daily weights, supplements, supportive measures and courage to eat  3.Chronic pain monitor on pain scale, monitor efficacy vs adverse side effects. Currently takingscheduled gabapentin; tylenol in addition to oxycodone 10mg  q6hrs/not at maximum usage; will continue to monitor; encouraged to ask when in pain, Continue to follow by psychotherapy and psychiatry for counseling; supportive services.  4.Palliative care encounter;Palliative medicine team will continue to support patient, patient's family, and medical team. Visit consisted of counseling and education dealing with the complex and emotionally intense issues of symptom management and palliative care in the setting of serious and potentially life-threatening illness  I spent 60 minutes providing this consultation,  Start at 11:45am. More than 50% of the time in this consultation was spent coordinating communication.   HISTORY OF PRESENT ILLNESS:  Chad [name] is a 59 y.o. year old male with multiple medical problems including lateonset CVA, urothelial cancer, prostate cancer, chronic hepatitis C, hyperlipidemia, colon surgery, protein calorie malnutrition, anxiety, depression.[name] continues to reside at [facility]. [name] does transfer himself to the wheelchair and is mobile at the facility. [name] does require assistance with bathing, dressing. [name] does feed himself and appetite  is fair. [name] does take Voltaren gel applied to his left shoulder left hip and left name qid though at times. Mr Lucus does refuse it. [name] also takes oxycodone 10 mg every 6 hours for pain 10mg /24hrs. [name] does have wounds to left lower extremity, left medial foot and right dorsal foot. Staff endorses no new changes or concern. [name] is able to advocate his own needs. At present [name] is sitting in the wheelchair in the hall. [name] and I talked about purpose of palliative care visit and he was in agreement. We talked about how he was feeling. [name] endorse is that he is doing okay. [name] was heading out to the smoking patio. Asked about smoking cessation but [name] politely declined. We talked about symptoms of pain when she does exhibit. [name] endorses the oxycodone does help relieve his pain. We talked about the Voltaren gel. Mr Hence endorse is he does not always use the Voltaren gel. [name] endorses sometimes it does help relieve his pain though. We talked about shortness of breath which he denies. We talked about his oncology visit. We talked about his appetite. [name] complained about the food as he feels like it does not taste good. We talked about his daily routine. We talked about his mobility. We talked about the edema in his lower extremities. We talked about residing at facility and challenges. We talked about medical goals of care. We talked about for all of palliative care and plan of care. We talked about palliative care visit in 2 months with ongoing cancer diagnosis and treatment for support and ongoing discussions of goals of care. [name] in agreement. Therapeutic listening and emotional support provided. I updated nursing staff no new changes to current goals or plan of care.  Palliative Care was asked to help to continue to address goals of care.   CODE STATUS: DNR  PPS: 50% HOSPICE  ELIGIBILITY/DIAGNOSIS: TBD  PAST MEDICAL HISTORY:  Past Medical History:  Diagnosis Date  . Anemia   . Anxiety   . ARF (acute respiratory failure) ([place])   . Bladder cancer ([place])   . Cancer [facility])    prostate  . COPD (chronic obstructive pulmonary disease) ([place])   . Depression   . Dysphagia   . Family history of colon cancer   . Family history of kidney cancer   . Family history of leukemia   . Family history of prostate cancer   . GERD (gastroesophageal reflux disease)   . Hepatitis    chronic hep c  . Hydronephrosis   . Hydronephrosis with ureteral stricture   . Hyperlipidemia   . Knee pain    Left  . Malignant neoplasm of colon ([place])   . Nerve pain   . Peripheral vascular disease ([place])   . Stroke ([place])   . Urinary frequency   . Venous hypertension of both lower extremities     SOCIAL HX:  Social History   Tobacco Use  . Smoking status: Current Every Day Smoker    Packs/day: 1.00    Types: Cigarettes  . Smokeless tobacco: Never Used  Substance Use Topics  . Alcohol use: Not Currently    ALLERGIES:  Allergies  Allergen Reactions  . Penicillins Rash     PERTINENT MEDICATIONS:  Outpatient Encounter Medications as of 03/28/2020  Medication Sig  . acetaminophen (TYLENOL) 325 MG tablet Take 650 mg by mouth 3 (three) times daily.   Marland Kitchen aspirin EC 81 MG tablet Take 1 tablet (81 mg total) by mouth daily.  Marland Kitchen atorvastatin (LIPITOR) 10 MG tablet Take 1 tablet (10 mg total) by mouth daily.  . clopidogrel (PLAVIX) 75 MG tablet Take 1 tablet (75 mg total) by mouth daily.  . collagenase (SANTYL) ointment Apply 1 application topically daily. LEFT LOWER LEG  . cyclobenzaprine (FLEXERIL) 10 MG tablet Take 10 mg by mouth at bedtime.  . diclofenac sodium (VOLTAREN) 1 % GEL Apply 2 g topically See admin instructions. Apply 2 g topically 4 times daily. Apply 2 g to left shoulder, apply 2 g to left hip and 2 g to left knee 4 times daily per [facility] instructions  . escitalopram (LEXAPRO) 5  MG tablet Take 5 mg by mouth daily.   Marland Kitchen gabapentin (NEURONTIN) 100 MG capsule Take 100 mg by mouth 3 (three) times daily.  . mirtazapine (REMERON) 7.5 MG tablet Take 7.5 mg by mouth at bedtime.  Marland Kitchen oxycodone (OXY-IR) 5 MG capsule Take 10 mg by mouth every 6 (six) hours as needed for pain.   . pantoprazole (PROTONIX) 40 MG tablet Take 40 mg by mouth daily.   No facility-administered encounter medications on file as of 03/28/2020.    PHYSICAL EXAM:   General: NAD, debilitated, pleasant male Cardiovascular: regular rate and rhythm Pulmonary: clear ant fields Neurological: W/C dependent  Ameka Krigbaum Ihor Gully, NP

## 2020-03-29 ENCOUNTER — Inpatient Hospital Stay (HOSPITAL_BASED_OUTPATIENT_CLINIC_OR_DEPARTMENT_OTHER): Payer: Medicaid Other | Admitting: Internal Medicine

## 2020-03-29 ENCOUNTER — Encounter: Payer: Self-pay | Admitting: Internal Medicine

## 2020-03-29 ENCOUNTER — Telehealth: Payer: Self-pay | Admitting: *Deleted

## 2020-03-29 ENCOUNTER — Inpatient Hospital Stay: Payer: Medicaid Other | Attending: Internal Medicine

## 2020-03-29 ENCOUNTER — Inpatient Hospital Stay: Payer: Medicaid Other

## 2020-03-29 DIAGNOSIS — F418 Other specified anxiety disorders: Secondary | ICD-10-CM | POA: Diagnosis not present

## 2020-03-29 DIAGNOSIS — Z5112 Encounter for antineoplastic immunotherapy: Secondary | ICD-10-CM | POA: Diagnosis not present

## 2020-03-29 DIAGNOSIS — Z8042 Family history of malignant neoplasm of prostate: Secondary | ICD-10-CM | POA: Diagnosis not present

## 2020-03-29 DIAGNOSIS — E785 Hyperlipidemia, unspecified: Secondary | ICD-10-CM | POA: Diagnosis not present

## 2020-03-29 DIAGNOSIS — I739 Peripheral vascular disease, unspecified: Secondary | ICD-10-CM | POA: Insufficient documentation

## 2020-03-29 DIAGNOSIS — Z9221 Personal history of antineoplastic chemotherapy: Secondary | ICD-10-CM | POA: Insufficient documentation

## 2020-03-29 DIAGNOSIS — Z85038 Personal history of other malignant neoplasm of large intestine: Secondary | ICD-10-CM | POA: Diagnosis not present

## 2020-03-29 DIAGNOSIS — C661 Malignant neoplasm of right ureter: Secondary | ICD-10-CM | POA: Diagnosis not present

## 2020-03-29 DIAGNOSIS — Z7982 Long term (current) use of aspirin: Secondary | ICD-10-CM | POA: Insufficient documentation

## 2020-03-29 DIAGNOSIS — D509 Iron deficiency anemia, unspecified: Secondary | ICD-10-CM | POA: Diagnosis not present

## 2020-03-29 DIAGNOSIS — M549 Dorsalgia, unspecified: Secondary | ICD-10-CM | POA: Insufficient documentation

## 2020-03-29 DIAGNOSIS — Z8673 Personal history of transient ischemic attack (TIA), and cerebral infarction without residual deficits: Secondary | ICD-10-CM | POA: Insufficient documentation

## 2020-03-29 DIAGNOSIS — I10 Essential (primary) hypertension: Secondary | ICD-10-CM | POA: Diagnosis not present

## 2020-03-29 DIAGNOSIS — B1920 Unspecified viral hepatitis C without hepatic coma: Secondary | ICD-10-CM | POA: Insufficient documentation

## 2020-03-29 DIAGNOSIS — F17210 Nicotine dependence, cigarettes, uncomplicated: Secondary | ICD-10-CM | POA: Diagnosis not present

## 2020-03-29 DIAGNOSIS — R5381 Other malaise: Secondary | ICD-10-CM | POA: Diagnosis not present

## 2020-03-29 DIAGNOSIS — J449 Chronic obstructive pulmonary disease, unspecified: Secondary | ICD-10-CM | POA: Diagnosis not present

## 2020-03-29 DIAGNOSIS — R5383 Other fatigue: Secondary | ICD-10-CM | POA: Insufficient documentation

## 2020-03-29 DIAGNOSIS — Z79899 Other long term (current) drug therapy: Secondary | ICD-10-CM | POA: Diagnosis not present

## 2020-03-29 DIAGNOSIS — M25569 Pain in unspecified knee: Secondary | ICD-10-CM | POA: Diagnosis not present

## 2020-03-29 DIAGNOSIS — Z515 Encounter for palliative care: Secondary | ICD-10-CM

## 2020-03-29 DIAGNOSIS — C189 Malignant neoplasm of colon, unspecified: Secondary | ICD-10-CM | POA: Diagnosis not present

## 2020-03-29 DIAGNOSIS — Z7189 Other specified counseling: Secondary | ICD-10-CM

## 2020-03-29 DIAGNOSIS — K219 Gastro-esophageal reflux disease without esophagitis: Secondary | ICD-10-CM | POA: Insufficient documentation

## 2020-03-29 LAB — COMPREHENSIVE METABOLIC PANEL
ALT: 20 U/L (ref 0–44)
AST: 28 U/L (ref 15–41)
Albumin: 3.6 g/dL (ref 3.5–5.0)
Alkaline Phosphatase: 118 U/L (ref 38–126)
Anion gap: 11 (ref 5–15)
BUN: 21 mg/dL (ref 8–23)
CO2: 24 mmol/L (ref 22–32)
Calcium: 8.6 mg/dL — ABNORMAL LOW (ref 8.9–10.3)
Chloride: 104 mmol/L (ref 98–111)
Creatinine, Ser: 0.76 mg/dL (ref 0.61–1.24)
GFR calc Af Amer: >60 mL/min (ref >60)
GFR calc non Af Amer: >60 mL/min (ref >60)
Glucose, Bld: 94 mg/dL (ref 70–99)
Potassium: 4.1 mmol/L (ref 3.5–5.1)
Sodium: 139 mmol/L (ref 135–145)
Total Bilirubin: 0.4 mg/dL (ref 0.3–1.2)
Total Protein: 7.7 g/dL (ref 6.5–8.1)

## 2020-03-29 LAB — CBC WITH DIFFERENTIAL/PLATELET
Abs Immature Granulocytes: 0.02 10*3/uL (ref 0.00–0.07)
Basophils Absolute: 0.1 10*3/uL (ref 0.0–0.1)
Basophils Relative: 1 %
Eosinophils Absolute: 0.4 10*3/uL (ref 0.0–0.5)
Eosinophils Relative: 5 %
HCT: 40.2 % (ref 39.0–52.0)
Hemoglobin: 13.3 g/dL (ref 13.0–17.0)
Immature Granulocytes: 0 %
Lymphocytes Relative: 17 %
Lymphs Abs: 1.4 10*3/uL (ref 0.7–4.0)
MCH: 27.3 pg (ref 26.0–34.0)
MCHC: 33.1 g/dL (ref 30.0–36.0)
MCV: 82.4 fL (ref 80.0–100.0)
Monocytes Absolute: 0.7 10*3/uL (ref 0.1–1.0)
Monocytes Relative: 9 %
Neutro Abs: 5.5 10*3/uL (ref 1.7–7.7)
Neutrophils Relative %: 68 %
Platelets: 196 10*3/uL (ref 150–400)
RBC: 4.88 MIL/uL (ref 4.22–5.81)
RDW: 14.9 % (ref 11.5–15.5)
WBC: 8 10*3/uL (ref 4.0–10.5)
nRBC: 0 % (ref 0.0–0.2)

## 2020-03-29 MED ORDER — SODIUM CHLORIDE 0.9 % IV SOLN
200.0000 mg | Freq: Once | INTRAVENOUS | Status: AC
  Administered 2020-03-29: 200 mg via INTRAVENOUS
  Filled 2020-03-29: qty 8

## 2020-03-29 MED ORDER — HEPARIN SOD (PORK) LOCK FLUSH 100 UNIT/ML IV SOLN
500.0000 [IU] | Freq: Once | INTRAVENOUS | Status: AC | PRN
  Administered 2020-03-29: 500 [IU]
  Filled 2020-03-29: qty 5

## 2020-03-29 MED ORDER — SODIUM CHLORIDE 0.9 % IV SOLN
Freq: Once | INTRAVENOUS | Status: AC
  Filled 2020-03-29: qty 250

## 2020-03-29 NOTE — Progress Notes (Signed)
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; # Colon ca- stage III  GOALS: palliative  CURRENT/MOST RECENT THERAPY: Keytruda [C]    Urothelial cancer ([place])   Initial Diagnosis   Urothelial cancer ([place])   Ureteral cancer, right ([place])  05/26/2018 Initial Diagnosis   Ureteral cancer, right ([place])   06/21/2018 -  Chemotherapy   The patient had pembrolizumab (KEYTRUDA) 200 mg in sodium chloride 0.9 % 50 mL chemo infusion, 200 mg, Intravenous, Once, 24 of 28 cycles Administration: 200 mg (06/21/2018), 200 mg (07/12/2018), 200 mg (08/02/2018), 200 mg (08/23/2018), 200 mg (09/13/2018), 200 mg (10/04/2018), 200 mg (10/25/2018), 200 mg (11/22/2018), 200 mg (12/13/2018), 200 mg (01/05/2019), 200 mg (01/24/2019), 200 mg (02/14/2019), 200 mg (03/11/2019), 200 mg (06/17/2019), 200 mg (08/26/2019), 200 mg (09/16/2019), 200 mg (10/07/2019), 200 mg (10/28/2019), 200 mg (11/24/2019), 200 mg (12/15/2019), 200 mg (01/05/2020), 200 mg (01/26/2020), 200 mg (02/16/2020), 200 mg (03/08/2020)  for chemotherapy treatment.       HISTORY OF PRESENTING ILLNESS: Patient is a poor historian.  Is alone.  Chad [name] 59 y.o.  male above history of stage IV-ureteral cancer/with multiple co-morbidities currently on Keytruda is here for follow-up.  Patient continues to complain of chronic knee pain back pain.  Not any worse.  Appetite is good.  No weight loss.  Chronic shortness of  breath chronic cough no diarrhea.  No skin rash.  No blood in urine..   Review of Systems  Constitutional: Positive for malaise/fatigue. Negative for chills, diaphoresis, fever and weight loss.  HENT: Negative for nosebleeds and sore throat.   Eyes: Negative for double vision.  Respiratory: Positive for cough and shortness of breath. Negative for hemoptysis and wheezing.   Cardiovascular: Negative for chest pain, palpitations and orthopnea.  Gastrointestinal: Negative for abdominal pain, blood in stool, constipation, diarrhea, heartburn, melena, nausea and vomiting.  Genitourinary: Negative for dysuria, frequency and urgency.  Musculoskeletal: Positive for back pain and joint pain.  Skin: Negative.  Negative for itching and rash.  Neurological: Positive for focal weakness. Negative for dizziness, tingling, weakness and headaches.       Chronic left-sided weakness upper than lower extremity.  Endo/Heme/Allergies: Does not bruise/bleed easily.  Psychiatric/Behavioral: Negative for depression. The patient is not nervous/anxious and does not have insomnia.      MEDICAL HISTORY:  Past Medical History:  Diagnosis Date  . Anemia   . Anxiety   . ARF (acute respiratory failure) ([place])   . Bladder cancer ([place])   . Cancer [facility])    prostate  . COPD (chronic obstructive pulmonary disease) ([place])   . Depression   . Dysphagia   . Family history of colon cancer   . Family history of kidney cancer   . Family history of leukemia   . Family history of prostate cancer   . GERD (gastroesophageal reflux disease)   . Hepatitis    chronic hep c  . Hydronephrosis   . Hydronephrosis with ureteral  stricture   . Hyperlipidemia   . Knee pain    Left  . Malignant neoplasm of colon ([place])   . Nerve pain   . Peripheral vascular disease ([place])   . Stroke ([place])   . Urinary frequency   . Venous hypertension of both lower extremities     SURGICAL HISTORY: Past Surgical History:  Procedure Laterality  Date  . COLON SURGERY     En bloc extended right hemicolectomy 07/2017  . CYSTOSCOPY W/ RETROGRADES Right 08/30/2018   Procedure: CYSTOSCOPY WITH RETROGRADE PYELOGRAM;  Surgeon: Hollice Espy, MD;  Location: [facility] ORS;  Service: Urology;  Laterality: Right;  . CYSTOSCOPY WITH STENT PLACEMENT Right 04/25/2018   Procedure: CYSTOSCOPY WITH STENT PLACEMENT;  Surgeon: Hollice Espy, MD;  Location: [facility];  Service: Urology;  Laterality: Right;  . CYSTOSCOPY WITH STENT PLACEMENT Right 08/30/2018   Procedure: [place] WITH STENT Exchange;  Surgeon: Hollice Espy, MD;  Location: [facility];  Service: Urology;  Laterality: Right;  . CYSTOSCOPY WITH STENT PLACEMENT Right 03/07/2019   Procedure: CYSTOSCOPY WITH STENT Exchange;  Surgeon: Hollice Espy, MD;  Location: [facility];  Service: Urology;  Laterality: Right;  . CYSTOSCOPY WITH STENT PLACEMENT Right 11/21/2019   Procedure: CYSTOSCOPY WITH STENT Exchange;  Surgeon: Hollice Espy, MD;  Location: [facility];  Service: Urology;  Laterality: Right;  . LOWER EXTREMITY ANGIOGRAPHY Left 05/23/2019   Procedure: LOWER EXTREMITY ANGIOGRAPHY;  Surgeon: Algernon Huxley, MD;  Location: [place] [facility];  Service: Cardiovascular;  Laterality: Left;  . LOWER EXTREMITY ANGIOGRAPHY Right 05/30/2019   Procedure: LOWER EXTREMITY ANGIOGRAPHY;  Surgeon: Algernon Huxley, MD;  Location: [place] [facility];  Service: Cardiovascular;  Laterality: Right;  . LOWER EXTREMITY ANGIOGRAPHY Right 02/13/2020   Procedure: LOWER EXTREMITY ANGIOGRAPHY;  Surgeon: Algernon Huxley, MD;  Location: [place] [facility];  Service: Cardiovascular;  Laterality: Right;  . LOWER EXTREMITY ANGIOGRAPHY Left 02/20/2020   Procedure: LOWER EXTREMITY ANGIOGRAPHY;  Surgeon: Algernon Huxley, MD;  Location: [place] [facility];  Service: Cardiovascular;  Laterality: Left;  . PORTA CATH INSERTION N/A 02/28/2019   Procedure: PORTA CATH INSERTION;  Surgeon: Algernon Huxley, MD;  Location: [place] [facility];  Service:  Cardiovascular;  Laterality: N/A;  . tumor removed       SOCIAL HISTORY: Social History   Socioeconomic History  . Marital status: Single    Spouse name: Not on file  . Number of children: Not on file  . Years of education: Not on file  . Highest education level: Not on file  Occupational History  . Not on file  Tobacco Use  . Smoking status: Current Every Day Smoker    Packs/day: 1.00    Types: Cigarettes  . Smokeless tobacco: Never Used  Vaping Use  . Vaping Use: Never used  Substance and Sexual Activity  . Alcohol use: Not Currently  . Drug use: Not Currently  . Sexual activity: Not Currently  Other Topics Concern  . Not on file  Social History Narrative    used to live [place]; moved  To [place]- end of April 2019; in Nursing home; 1pp/day; quit alcohol. 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        Family- dad- prostate ca [at 86y]; brother- 76 died of prostate cancer; brother- 44- no cancers [[place]]; sonGerald Stabs [[place]];[name]-32y prostate ca [facility] [place]]; daughter- 2 [[place]]; another daughter 41 [[place]/addict]. will refer genetics counseling. Given MSI- abnormal; highly suspicious of Lynch syndrome.  Patient's son [name] aware of high possible lynch syndrome.   Social Determinants of Health   Financial Resource Strain:   . Difficulty of Paying Living Expenses: Not on file  Food Insecurity:   . Worried About Charity fundraiser in the Last Year: Not on file  . Ran Out of Food in the Last Year: Not on file  Transportation Needs:   . Lack of Transportation (Medical): Not on file  . Lack of Transportation (Non-Medical): Not on file  Physical Activity:   . Days of Exercise per Week: Not on file  . Minutes of Exercise per Session: Not on file  Stress:   . Feeling of Stress : Not on file  Social Connections:   . Frequency of Communication with Friends and Family: Not on file  . Frequency of Social Gatherings with Friends and Family: Not on file  . Attends Religious Services: Not  on file  . Active Member of Clubs or Organizations: Not on file  . Attends Archivist Meetings: Not on file  . Marital Status: Not on file  Intimate Partner Violence:   . Fear of Current or Ex-Partner: Not on file  . Emotionally Abused: Not on file  . Physically Abused: Not on file  . Sexually Abused: Not on file    FAMILY HISTORY: Family History  Problem Relation Age of Onset  . Prostate cancer Father 7  . Cancer Brother 54       unsure type  . Cancer Paternal Uncle        unsure type  . Cancer Maternal Grandmother        unsure type  . Cancer Paternal Grandmother        unsure type  . Kidney cancer Paternal Grandfather   . Cancer Other        unsure types  . Leukemia Son   . Cancer Son        other cancers, possibly colon    ALLERGIES:  is allergic to penicillins.  MEDICATIONS:  Current Outpatient Medications  Medication Sig Dispense Refill  . acetaminophen (TYLENOL) 325 MG tablet Take 650 mg by mouth 3 (three) times daily.     Marland Kitchen aspirin EC 81 MG tablet Take 1 tablet (81 mg total) by mouth daily. 150 tablet 2  . atorvastatin (LIPITOR) 10 MG tablet Take 1 tablet (10 mg total) by mouth daily. 30 tablet 11  . clopidogrel (PLAVIX) 75 MG tablet Take 1 tablet (75 mg total) by mouth daily. 30 tablet 11  . collagenase (SANTYL) ointment Apply 1 application topically daily. LEFT LOWER LEG    . cyclobenzaprine (FLEXERIL) 10 MG tablet Take 10 mg by mouth at bedtime.    . diclofenac sodium (VOLTAREN) 1 % GEL Apply 2 g topically See admin instructions. Apply 2 g topically 4 times daily. Apply 2 g to left shoulder, apply 2 g to left hip and 2 g to left knee 4 times daily per [facility] instructions    . escitalopram (LEXAPRO) 5 MG tablet Take 5 mg by mouth daily.     Marland Kitchen gabapentin (NEURONTIN) 100 MG capsule Take 100 mg by mouth 3 (three) times daily.    . mirtazapine (REMERON) 7.5 MG tablet Take 7.5 mg by mouth at bedtime.    Marland Kitchen oxycodone (OXY-IR) 5 MG capsule Take 10 mg by mouth  every 6 (six) hours as needed for pain.     . pantoprazole (PROTONIX) 40 MG tablet Take 40 mg by mouth daily.  No current facility-administered medications for this visit.      Marland Kitchen  PHYSICAL EXAMINATION: ECOG PERFORMANCE STATUS: 1 - Symptomatic but completely ambulatory  Vitals:   03/29/20 0844  BP: 126/83  Pulse: 82  Resp: 16  Temp: (!) 96.4 F (35.8 C)  SpO2: 97%   Filed Weights   03/29/20 0844  Weight: 172 lb (78 kg)    Physical Exam Constitutional:      Comments: In wheelchair. Cachectic.  Appears older than his stated age.  Alone.  HENT:     Head: Normocephalic and atraumatic.     Mouth/Throat:     Pharynx: No oropharyngeal exudate.  Eyes:     Pupils: Pupils are equal, round, and reactive to light.  Cardiovascular:     Rate and Rhythm: Normal rate and regular rhythm.  Pulmonary:     Effort: No respiratory distress.     Breath sounds: No wheezing.  Abdominal:     General: Bowel sounds are normal. There is no distension.     Palpations: Abdomen is soft. There is no mass.     Tenderness: There is no abdominal tenderness. There is no guarding or rebound.  Musculoskeletal:        General: No tenderness. Normal range of motion.     Cervical back: Normal range of motion and neck supple.  Skin:    General: Skin is warm.     Comments: Bilateral lower extremity ulcerations noted.  Pulses intact bilaterally.  Neurological:     Mental Status: He is oriented to person, place, and time.     Comments: Sleepy but easily arousable.  Chronic weakness left upper and lower extremity.  Psychiatric:        Mood and Affect: Affect normal.      LABORATORY DATA:  I have reviewed the data as listed Lab Results  Component Value Date   WBC 8.0 03/29/2020   HGB 13.3 03/29/2020   HCT 40.2 03/29/2020   MCV 82.4 03/29/2020   PLT 196 03/29/2020   Recent Labs    02/16/20 0855 02/16/20 0855 02/20/20 0951 03/08/20 0905 03/29/20 0824  NA 138  --   --  137 139  K 4.1  --    --  4.4 4.1  CL 104  --   --  103 104  CO2 26  --   --  25 24  GLUCOSE 88  --   --  88 94  BUN 21   < > '17 18 21  ' CREATININE 0.85   < > 0.70 0.85 0.76  CALCIUM 8.9  --   --  8.8* 8.6*  GFRNONAA >60   < > >60 >60 >60  GFRAA >60   < > >60 >60 >60  PROT 8.2*  --   --  8.0 7.7  ALBUMIN 4.0  --   --  3.8 3.6  AST 29  --   --  32 28  ALT 21  --   --  26 20  ALKPHOS 109  --   --  127* 118  BILITOT 0.7  --   --  0.6 0.4   < > = values in this interval not displayed.    RADIOGRAPHIC STUDIES: I have personally reviewed the radiological images as listed and agreed with the findings in the report. VAS [place] ABI WITH/WO TBI  Result Date: 03/13/2020 LOWER EXTREMITY DOPPLER STUDY  Vascular Interventions: 05/23/2019 PTA left PTA.  05/30/2019 PTA right distal PTA.                         02/13/2020 PTA of right PTA.                         02/20/2020 Catheter placement of SFA. Comparison Study: 02/01/2020 Performing Technologist: Charlane Ferretti RT (R)(VS)  Examination Guidelines: A complete evaluation includes at minimum, Doppler waveform signals and systolic blood pressure reading at the level of bilateral brachial, anterior tibial, and posterior tibial arteries, when vessel segments are accessible. Bilateral testing is considered an integral part of a complete examination. Photoelectric Plethysmograph (PPG) waveforms and toe systolic pressure readings are included as required and additional duplex testing as needed. Limited examinations for reoccurring indications may be performed as noted.  ABI Findings: +---------+------------------+-----+----------+--------+ Right    Rt Pressure (mmHg)IndexWaveform  Comment  +---------+------------------+-----+----------+--------+ Brachial 131                                       +---------+------------------+-----+----------+--------+ ATA      130               0.97 monophasic          +---------+------------------+-----+----------+--------+ PTA      81                0.60 monophasic         +---------+------------------+-----+----------+--------+ Great Toe56                0.42 Abnormal           +---------+------------------+-----+----------+--------+ +---------+------------------+-----+---------+-------+ Left     Lt Pressure (mmHg)IndexWaveform Comment +---------+------------------+-----+---------+-------+ Brachial 134                                     +---------+------------------+-----+---------+-------+ ATA      170               1.27 triphasic        +---------+------------------+-----+---------+-------+ PTA      140               1.04 triphasic        +---------+------------------+-----+---------+-------+ Great Toe103               0.77 Normal           +---------+------------------+-----+---------+-------+ Summary: Right: Resting right ankle-brachial index is within normal range. No evidence of significant right lower extremity arterial disease. The right toe-brachial index is abnormal. Although ankle brachial indices are within normal limits (0.95-1.29), arterial Doppler waveforms at the ankle suggest some component of arterial occlusive disease. Left: Resting left ankle-brachial index is within normal range. No evidence of significant left lower extremity arterial disease. The left toe-brachial index is normal. *See table(s) above for measurements and observations.  Electronically signed by Leotis Pain MD on 03/13/2020 at 5:14:44 PM.   Final     ASSESSMENT & PLAN:   Ureteral cancer, right ([place]) # High-grade urothelial cancer/cytology; likely of the right renal pelvis /upper ureter. 14th  May 2021 C/A/P-stable; Slight ureteral thickening/small pelvic/retroperitoneal lymph nodes.  No lesions in the liver. STABLE.   # Proceed with [place] today; Labs today reviewed;  acceptable for treatment today.  Discussed with patient that treatments  are indefinite.  will order CT scan today  # Iron deficiency anemia-hemoglobin 13-improved. STABLE.   # Bilateral LE ulcers-s/p wound care evaluation; s/p PTCA with Dr.Dew. bil Arterial Dopp-wnl. Stable.  #DISPOSITION: [name] today;  # follow up in 3 weeks -MD labs-cbc/cmp; Keytruda/CT scan A/P-- Dr.B     All questions were answered. The patient knows to call the clinic with any problems, questions or concerns.    Cammie Sickle, MD 03/29/2020 9:28 AM

## 2020-03-29 NOTE — Assessment & Plan Note (Addendum)
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 14th  May 2021 C/A/P-stable; Slight ureteral thickening/small pelvic/retroperitoneal lymph nodes.  No lesions in the liver. STABLE.   # Proceed with [place] today; Labs today reviewed;  acceptable for treatment today.  Discussed with patient that treatments are indefinite.  will order CT scan today  # Iron deficiency anemia-hemoglobin 13-improved. STABLE.   # Bilateral LE ulcers-s/p wound care evaluation; s/p PTCA with Dr.Dew. bil Arterial Dopp-wnl. Stable.  #DISPOSITION: [name] today;  # follow up in 3 weeks -MD labs-cbc/cmp; Keytruda/CT scan A/P-- Dr.B

## 2020-04-17 ENCOUNTER — Ambulatory Visit
Admission: RE | Admit: 2020-04-17 | Discharge: 2020-04-17 | Disposition: A | Discharge status: Home / Self Care | Payer: Medicaid Other | Source: Ambulatory Visit | Attending: Internal Medicine | Admitting: Internal Medicine

## 2020-04-17 ENCOUNTER — Other Ambulatory Visit: Payer: Self-pay

## 2020-04-17 DIAGNOSIS — C661 Malignant neoplasm of right ureter: Secondary | ICD-10-CM | POA: Diagnosis not present

## 2020-04-17 MED ORDER — IOHEXOL 300 MG/ML  SOLN
100.0000 mL | Freq: Once | INTRAMUSCULAR | Status: AC | PRN
  Administered 2020-04-17: 100 mL via INTRAVENOUS

## 2020-04-19 ENCOUNTER — Inpatient Hospital Stay: Payer: Medicaid Other

## 2020-04-19 ENCOUNTER — Other Ambulatory Visit: Payer: Self-pay

## 2020-04-19 ENCOUNTER — Inpatient Hospital Stay (HOSPITAL_BASED_OUTPATIENT_CLINIC_OR_DEPARTMENT_OTHER): Payer: Medicaid Other | Admitting: Internal Medicine

## 2020-04-19 DIAGNOSIS — C661 Malignant neoplasm of right ureter: Secondary | ICD-10-CM

## 2020-04-19 DIAGNOSIS — Z7189 Other specified counseling: Secondary | ICD-10-CM

## 2020-04-19 DIAGNOSIS — Z515 Encounter for palliative care: Secondary | ICD-10-CM

## 2020-04-19 DIAGNOSIS — Z5112 Encounter for antineoplastic immunotherapy: Secondary | ICD-10-CM | POA: Diagnosis not present

## 2020-04-19 LAB — CBC WITH DIFFERENTIAL/PLATELET
Abs Immature Granulocytes: 0.03 10*3/uL (ref 0.00–0.07)
Basophils Absolute: 0.1 10*3/uL (ref 0.0–0.1)
Basophils Relative: 1 %
Eosinophils Absolute: 0.4 10*3/uL (ref 0.0–0.5)
Eosinophils Relative: 5 %
HCT: 41 % (ref 39.0–52.0)
Hemoglobin: 13.5 g/dL (ref 13.0–17.0)
Immature Granulocytes: 0 %
Lymphocytes Relative: 16 %
Lymphs Abs: 1.3 10*3/uL (ref 0.7–4.0)
MCH: 27 pg (ref 26.0–34.0)
MCHC: 32.9 g/dL (ref 30.0–36.0)
MCV: 82 fL (ref 80.0–100.0)
Monocytes Absolute: 0.6 10*3/uL (ref 0.1–1.0)
Monocytes Relative: 7 %
Neutro Abs: 5.5 10*3/uL (ref 1.7–7.7)
Neutrophils Relative %: 71 %
Platelets: 175 10*3/uL (ref 150–400)
RBC: 5 MIL/uL (ref 4.22–5.81)
RDW: 14.4 % (ref 11.5–15.5)
WBC: 7.8 10*3/uL (ref 4.0–10.5)
nRBC: 0 % (ref 0.0–0.2)

## 2020-04-19 LAB — COMPREHENSIVE METABOLIC PANEL
ALT: 24 U/L (ref 0–44)
AST: 33 U/L (ref 15–41)
Albumin: 3.7 g/dL (ref 3.5–5.0)
Alkaline Phosphatase: 115 U/L (ref 38–126)
Anion gap: 7 (ref 5–15)
BUN: 19 mg/dL (ref 8–23)
CO2: 26 mmol/L (ref 22–32)
Calcium: 8.4 mg/dL — ABNORMAL LOW (ref 8.9–10.3)
Chloride: 101 mmol/L (ref 98–111)
Creatinine, Ser: 0.92 mg/dL (ref 0.61–1.24)
GFR calc Af Amer: >60 mL/min (ref >60)
GFR calc non Af Amer: >60 mL/min (ref >60)
Glucose, Bld: 140 mg/dL — ABNORMAL HIGH (ref 70–99)
Potassium: 3.8 mmol/L (ref 3.5–5.1)
Sodium: 134 mmol/L — ABNORMAL LOW (ref 135–145)
Total Bilirubin: 0.4 mg/dL (ref 0.3–1.2)
Total Protein: 7.8 g/dL (ref 6.5–8.1)

## 2020-04-19 MED ORDER — HEPARIN SOD (PORK) LOCK FLUSH 100 UNIT/ML IV SOLN
INTRAVENOUS | Status: AC
  Filled 2020-04-19: qty 5

## 2020-04-19 MED ORDER — SODIUM CHLORIDE 0.9% FLUSH
10.0000 mL | INTRAVENOUS | Status: DC | PRN
  Administered 2020-04-19: 10 mL
  Filled 2020-04-19: qty 10

## 2020-04-19 MED ORDER — HEPARIN SOD (PORK) LOCK FLUSH 100 UNIT/ML IV SOLN
500.0000 [IU] | Freq: Once | INTRAVENOUS | Status: AC | PRN
  Administered 2020-04-19: 500 [IU]
  Filled 2020-04-19: qty 5

## 2020-04-19 MED ORDER — SODIUM CHLORIDE 0.9 % IV SOLN
200.0000 mg | Freq: Once | INTRAVENOUS | Status: AC
  Administered 2020-04-19: 200 mg via INTRAVENOUS
  Filled 2020-04-19: qty 8

## 2020-04-19 MED ORDER — SODIUM CHLORIDE 0.9 % IV SOLN
Freq: Once | INTRAVENOUS | Status: AC
  Filled 2020-04-19: qty 250

## 2020-04-19 NOTE — Assessment & Plan Note (Addendum)
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 SEP 21st 2021 C/A/P-stable; Slight ureteral thickening/small pelvic/retroperitoneal lymph nodes.  No lesions in the liver. STABLE.   # Proceed with [place] today; Labs today reviewed;  acceptable for treatment today.   # Iron deficiency anemia-hemoglobin 13.5- stable. .   # Bilateral LE ulcers-s/p wound care evaluation; s/p PTCA with Dr.Dew. bil Arterial Dopp-wnl. STABLE.   #DISPOSITION: [name] today;  # follow up in 3 weeks -MD labs-cbc/cmp;TSH; [name]-- Dr.B

## 2020-04-19 NOTE — Progress Notes (Signed)
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; # Colon ca- stage III  GOALS: palliative  CURRENT/MOST RECENT THERAPY: Keytruda [C]    Urothelial cancer ([place])   Initial Diagnosis   Urothelial cancer ([place])   Ureteral cancer, right ([place])  05/26/2018 Initial Diagnosis   Ureteral cancer, right ([place])   06/21/2018 -  Chemotherapy   The patient had pembrolizumab (KEYTRUDA) 200 mg in sodium chloride 0.9 % 50 mL chemo infusion, 200 mg, Intravenous, Once, 25 of 28 cycles Administration: 200 mg (06/21/2018), 200 mg (07/12/2018), 200 mg (08/02/2018), 200 mg (08/23/2018), 200 mg (09/13/2018), 200 mg (10/04/2018), 200 mg (10/25/2018), 200 mg (11/22/2018), 200 mg (12/13/2018), 200 mg (01/05/2019), 200 mg (01/24/2019), 200 mg (02/14/2019), 200 mg (03/11/2019), 200 mg (06/17/2019), 200 mg (08/26/2019), 200 mg (09/16/2019), 200 mg (10/07/2019), 200 mg (10/28/2019), 200 mg (11/24/2019), 200 mg (12/15/2019), 200 mg (01/05/2020), 200 mg (01/26/2020), 200 mg (02/16/2020), 200 mg (03/08/2020), 200 mg (03/29/2020)  for chemotherapy treatment.       HISTORY OF PRESENTING ILLNESS: Patient is a poor historian.  Is alone.  Chad [name] 59 y.o.  male above history of stage IV-ureteral cancer/with multiple co-morbidities currently on Keytruda is here for follow-up/review results of the restaging CAT scan.  Appetite is good.  No weight loss.  Chronic shortness of breath.  No diarrhea.  No  skin rash.  No blood in urine.  Complains of wound healing of the left heel.  Followed by wound care at the nursing home.  Review of Systems  Constitutional: Positive for malaise/fatigue. Negative for chills, diaphoresis, fever and weight loss.  HENT: Negative for nosebleeds and sore throat.   Eyes: Negative for double vision.  Respiratory: Positive for cough and shortness of breath. Negative for hemoptysis and wheezing.   Cardiovascular: Negative for chest pain, palpitations and orthopnea.  Gastrointestinal: Negative for abdominal pain, blood in stool, constipation, diarrhea, heartburn, melena, nausea and vomiting.  Genitourinary: Negative for dysuria, frequency and urgency.  Musculoskeletal: Positive for back pain and joint pain.  Skin: Negative.  Negative for itching and rash.  Neurological: Positive for focal weakness. Negative for dizziness, tingling, weakness and headaches.       Chronic left-sided weakness upper than lower extremity.  Endo/Heme/Allergies: Does not bruise/bleed easily.  Psychiatric/Behavioral: Negative for depression. The patient is not nervous/anxious and does not have insomnia.      MEDICAL HISTORY:  Past Medical History:  Diagnosis Date  . Anemia   . Anxiety   . ARF (acute respiratory failure) ([place])   . Bladder cancer ([place])   . Cancer [name])    prostate  . COPD (chronic obstructive pulmonary disease) ([place])   . Depression   . Dysphagia   . Family history of colon cancer   . Family history of kidney cancer   . Family history of leukemia   . Family history of prostate cancer   . GERD (gastroesophageal reflux disease)   . Hepatitis    chronic hep  c  . Hydronephrosis   . Hydronephrosis with ureteral stricture   . Hyperlipidemia   . Knee pain    Left  . Malignant neoplasm of colon ([place])   . Nerve pain   . Peripheral vascular disease ([place])   . Stroke ([place])   . Urinary frequency   . Venous hypertension of both lower extremities     SURGICAL  HISTORY: Past Surgical History:  Procedure Laterality Date  . COLON SURGERY     En bloc extended right hemicolectomy 07/2017  . CYSTOSCOPY W/ RETROGRADES Right 08/30/2018   Procedure: CYSTOSCOPY WITH RETROGRADE PYELOGRAM;  Surgeon: Hollice Espy, MD;  Location: [facility];  Service: Urology;  Laterality: Right;  . CYSTOSCOPY WITH STENT PLACEMENT Right 04/25/2018   Procedure: CYSTOSCOPY WITH STENT PLACEMENT;  Surgeon: Hollice Espy, MD;  Location: [facility];  Service: Urology;  Laterality: Right;  . CYSTOSCOPY WITH STENT PLACEMENT Right 08/30/2018   Procedure: [place] WITH STENT Exchange;  Surgeon: Hollice Espy, MD;  Location: [facility];  Service: Urology;  Laterality: Right;  . CYSTOSCOPY WITH STENT PLACEMENT Right 03/07/2019   Procedure: CYSTOSCOPY WITH STENT Exchange;  Surgeon: Hollice Espy, MD;  Location: [facility];  Service: Urology;  Laterality: Right;  . CYSTOSCOPY WITH STENT PLACEMENT Right 11/21/2019   Procedure: CYSTOSCOPY WITH STENT Exchange;  Surgeon: Hollice Espy, MD;  Location: [facility];  Service: Urology;  Laterality: Right;  . LOWER EXTREMITY ANGIOGRAPHY Left 05/23/2019   Procedure: LOWER EXTREMITY ANGIOGRAPHY;  Surgeon: Algernon Huxley, MD;  Location: [place] [facility];  Service: Cardiovascular;  Laterality: Left;  . LOWER EXTREMITY ANGIOGRAPHY Right 05/30/2019   Procedure: LOWER EXTREMITY ANGIOGRAPHY;  Surgeon: Algernon Huxley, MD;  Location: [place] [facility];  Service: Cardiovascular;  Laterality: Right;  . LOWER EXTREMITY ANGIOGRAPHY Right 02/13/2020   Procedure: LOWER EXTREMITY ANGIOGRAPHY;  Surgeon: Algernon Huxley, MD;  Location: [place] [facility];  Service: Cardiovascular;  Laterality: Right;  . LOWER EXTREMITY ANGIOGRAPHY Left 02/20/2020   Procedure: LOWER EXTREMITY ANGIOGRAPHY;  Surgeon: Algernon Huxley, MD;  Location: [place] [facility];  Service: Cardiovascular;  Laterality: Left;  . PORTA CATH INSERTION N/A 02/28/2019   Procedure: PORTA CATH INSERTION;  Surgeon: Algernon Huxley, MD;  Location: [place] [facility];  Service: Cardiovascular;  Laterality: N/A;  . tumor removed       SOCIAL HISTORY: Social History   Socioeconomic History  . Marital status: Single    Spouse name: Not on file  . Number of children: Not on file  . Years of education: Not on file  . Highest education level: Not on file  Occupational History  . Not on file  Tobacco Use  . Smoking status: Current Every Day Smoker    Packs/day: 1.00    Types: Cigarettes  . Smokeless tobacco: Never Used  Vaping Use  . Vaping Use: Never used  Substance and Sexual Activity  . Alcohol use: Not Currently  . Drug use: Not Currently  . Sexual activity: Not Currently  Other Topics Concern  . Not on file  Social History Narrative    used to live [place]; moved  To [place]- end of April 2019; in Nursing home; 1pp/day; quit alcohol. 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        Family- dad- prostate ca [at 62y]; brother- 69 died of prostate cancer; brother- 62- no cancers [[place]]; sonGerald Stabs [[place]];[name]-32y prostate ca [facility] [place]]; daughter- 41 [[place]]; another daughter 35 [[place]/addict]. will refer genetics  counseling. Given MSI- abnormal; highly suspicious of Lynch syndrome.  Patient's son [name] aware of high possible lynch syndrome.   Social Determinants of Health   Financial Resource Strain:   . Difficulty of Paying Living Expenses: Not on file  Food Insecurity:   . Worried About Charity fundraiser in the Last Year: Not on file  . Ran Out of Food in the Last Year: Not on file  Transportation Needs:   . Lack of Transportation (Medical): Not on file  . Lack of Transportation (Non-Medical): Not on file  Physical Activity:   . Days of Exercise per Week: Not on file  . Minutes of Exercise per Session: Not on file  Stress:   . Feeling of Stress : Not on file  Social Connections:   . Frequency of Communication with Friends and Family: Not on file  . Frequency of Social Gatherings with Friends and  Family: Not on file  . Attends Religious Services: Not on file  . Active Member of Clubs or Organizations: Not on file  . Attends Archivist Meetings: Not on file  . Marital Status: Not on file  Intimate Partner Violence:   . Fear of Current or Ex-Partner: Not on file  . Emotionally Abused: Not on file  . Physically Abused: Not on file  . Sexually Abused: Not on file    FAMILY HISTORY: Family History  Problem Relation Age of Onset  . Prostate cancer Father 100  . Cancer Brother 24       unsure type  . Cancer Paternal Uncle        unsure type  . Cancer Maternal Grandmother        unsure type  . Cancer Paternal Grandmother        unsure type  . Kidney cancer Paternal Grandfather   . Cancer Other        unsure types  . Leukemia Son   . Cancer Son        other cancers, possibly colon    ALLERGIES:  is allergic to penicillins.  MEDICATIONS:  Current Outpatient Medications  Medication Sig Dispense Refill  . acetaminophen (TYLENOL) 325 MG tablet Take 650 mg by mouth 3 (three) times daily.     Marland Kitchen aspirin EC 81 MG tablet Take 1 tablet (81 mg total) by mouth daily. 150 tablet 2  . atorvastatin (LIPITOR) 10 MG tablet Take 1 tablet (10 mg total) by mouth daily. 30 tablet 11  . clopidogrel (PLAVIX) 75 MG tablet Take 1 tablet (75 mg total) by mouth daily. 30 tablet 11  . collagenase (SANTYL) ointment Apply 1 application topically daily. LEFT LOWER LEG    . cyclobenzaprine (FLEXERIL) 10 MG tablet Take 10 mg by mouth at bedtime.    . diclofenac sodium (VOLTAREN) 1 % GEL Apply 2 g topically See admin instructions. Apply 2 g topically 4 times daily. Apply 2 g to left shoulder, apply 2 g to left hip and 2 g to left knee 4 times daily per [facility] instructions    . escitalopram (LEXAPRO) 5 MG tablet Take 5 mg by mouth daily.     Marland Kitchen gabapentin (NEURONTIN) 100 MG capsule Take 100 mg by mouth 3 (three) times daily.    . mirtazapine (REMERON) 7.5 MG tablet Take 7.5 mg by mouth at bedtime.     Marland Kitchen oxycodone (OXY-IR) 5 MG capsule Take 10 mg by mouth every 6 (six) hours as needed for pain.     . pantoprazole (PROTONIX)  40 MG tablet Take 40 mg by mouth daily.     No current facility-administered medications for this visit.      Marland Kitchen  PHYSICAL EXAMINATION: ECOG PERFORMANCE STATUS: 1 - Symptomatic but completely ambulatory  Vitals:   04/19/20 0917  BP: 115/85  Pulse: 90  Resp: 16  Temp: (!) 97.4 F (36.3 C)  SpO2: 100%   Filed Weights   04/19/20 0917  Weight: 176 lb (79.8 kg)    Physical Exam Constitutional:      Comments: In wheelchair. Cachectic.  Appears older than his stated age.  Alone.  HENT:     Head: Normocephalic and atraumatic.     Mouth/Throat:     Pharynx: No oropharyngeal exudate.  Eyes:     Pupils: Pupils are equal, round, and reactive to light.  Cardiovascular:     Rate and Rhythm: Normal rate and regular rhythm.  Pulmonary:     Effort: No respiratory distress.     Breath sounds: No wheezing.  Abdominal:     General: Bowel sounds are normal. There is no distension.     Palpations: Abdomen is soft. There is no mass.     Tenderness: There is no abdominal tenderness. There is no guarding or rebound.  Musculoskeletal:        General: No tenderness. Normal range of motion.     Cervical back: Normal range of motion and neck supple.  Skin:    General: Skin is warm.     Comments: Bilateral lower extremity ulcerations noted.  Pulses intact bilaterally.  Neurological:     Mental Status: He is oriented to person, place, and time.     Comments: Sleepy but easily arousable.  Chronic weakness left upper and lower extremity.  Psychiatric:        Mood and Affect: Affect normal.      LABORATORY DATA:  I have reviewed the data as listed Lab Results  Component Value Date   WBC 7.8 04/19/2020   HGB 13.5 04/19/2020   HCT 41.0 04/19/2020   MCV 82.0 04/19/2020   PLT 175 04/19/2020   Recent Labs    03/08/20 0905 03/29/20 0824 04/19/20 0858  NA 137  139 134*  K 4.4 4.1 3.8  CL 103 104 101  CO2 _0 GLUCOSE 88 94 140*  BUN _1 CREATININE 0.85 0.76 0.92  CALCIUM 8.8* 8.6* 8.4*  GFRNONAA >60 >60 >60  GFRAA >60 >60 >60  PROT 8.0 7.7 7.8  ALBUMIN 3.8 3.6 3.7  AST 32 28 33  ALT _2 ALKPHOS 127* 118 115  BILITOT 0.6 0.4 0.4    RADIOGRAPHIC STUDIES: I have personally reviewed the radiological images as listed and agreed with the findings in the report. CT Abdomen Pelvis W Contrast  Result Date: 04/17/2020 CLINICAL DATA:  History of resected right colon cancer in 2019. History of right renal pelvis/ureteral urothelial carcinoma status post stent placement. Restaging. EXAM: CT ABDOMEN AND PELVIS WITH CONTRAST TECHNIQUE: Multidetector CT imaging of the abdomen and pelvis was performed using the standard protocol following bolus administration of intravenous contrast. CONTRAST:  135m OMNIPAQUE IOHEXOL 300 MG/ML  SOLN COMPARISON:  12/09/2019 CT chest, abdomen and pelvis. FINDINGS: Lower chest: No significant pulmonary nodules or acute consolidative airspace disease. Hepatobiliary: Normal liver size. No liver mass. Normal gallbladder with no radiopaque cholelithiasis. No biliary ductal dilatation. Pancreas: Normal, with no mass or duct dilation. Spleen: Normal size. No mass. Adrenals/Urinary Tract: Normal adrenals. Progressive asymmetric  right renal atrophy. Well-positioned right nephroureteral stent with proximal pigtail portion in the right renal pelvis and distal pigtail portion in the bladder. Diffuse urothelial thickening and enhancement throughout central right renal collecting system and right renal pelvis and right lumbar ureter with hazy stranding of the surrounding fat, not substantially changed. Stable mild right hydronephrosis. No left hydronephrosis. Simple 1.9 cm anterior lower left renal cyst. Subcentimeter hypodense renal cortical lesion in the posterior upper right kidney is too small to characterize and is unchanged,  considered benign. No new renal lesions. Normal bladder. Stomach/Bowel: Normal non-distended stomach. Stable postsurgical changes from right hemicolectomy with intact appearing ileocolic anastomosis in mid abdomen. No dilated or thick-walled small bowel loops. Oral contrast transits to the left colon. No definite large bowel wall thickening, colonic diverticulosis or acute pericolonic fat stranding. Moderate stool in the remnant large-bowel. Vascular/Lymphatic: Atherosclerotic nonaneurysmal abdominal aorta. Patent portal, splenic, hepatic and renal veins. Mildly enlarged bilateral inguinal lymph nodes up to 1.4 cm on the left (series 2/image 95), all stable. Otherwise no pathologically enlarged abdominopelvic nodes. Reproductive: Borderline mild prostatomegaly. Other: No pneumoperitoneum, ascites or focal fluid collection. Musculoskeletal: No aggressive appearing focal osseous lesions. IMPRESSION: 1. Progressive asymmetric right renal atrophy. Well-positioned right nephroureteral stent. Stable mild right hydronephrosis. Stable diffuse urothelial thickening and enhancement throughout the central right renal collecting system, right renal pelvis and right lumbar ureter with hazy stranding of the surrounding fat, compatible with stable upper tract urothelial malignancy. 2. No findings highly suspicious for metastatic disease in the abdomen or pelvis. Nonspecific stable mild bilateral inguinal lymphadenopathy. 3. Aortic Atherosclerosis (ICD10-I70.0). Electronically Signed   By: Ilona Sorrel M.D.   On: 04/17/2020 12:44    ASSESSMENT & PLAN:   Ureteral cancer, right ([place]) # High-grade urothelial cancer/cytology; likely of the right renal pelvis /upper ureter. SEP 21st 2021 C/A/P-stable; Slight ureteral thickening/small pelvic/retroperitoneal lymph nodes.  No lesions in the liver. STABLE.   # Proceed with [place] today; Labs today reviewed;  acceptable for treatment today.   # Iron deficiency anemia-hemoglobin  13.5- stable. .   # Bilateral LE ulcers-s/p wound care evaluation; s/p PTCA with Dr.Dew. bil Arterial Dopp-wnl. STABLE.   #DISPOSITION: [name] today;  # follow up in 3 weeks -MD labs-cbc/cmp;TSH; [name]-- Dr.B     All questions were answered. The patient knows to call the clinic with any problems, questions or concerns.    Cammie Sickle, MD 04/19/2020 9:45 AM

## 2020-05-03 ENCOUNTER — Other Ambulatory Visit: Payer: Medicaid Other

## 2020-05-03 ENCOUNTER — Ambulatory Visit: Payer: Medicaid Other

## 2020-05-03 ENCOUNTER — Ambulatory Visit: Payer: Medicaid Other | Admitting: Internal Medicine

## 2020-05-10 ENCOUNTER — Inpatient Hospital Stay: Payer: Medicaid Other | Attending: Internal Medicine

## 2020-05-10 ENCOUNTER — Inpatient Hospital Stay (HOSPITAL_BASED_OUTPATIENT_CLINIC_OR_DEPARTMENT_OTHER): Payer: Medicaid Other | Admitting: Internal Medicine

## 2020-05-10 ENCOUNTER — Other Ambulatory Visit: Payer: Self-pay

## 2020-05-10 ENCOUNTER — Inpatient Hospital Stay: Payer: Medicaid Other

## 2020-05-10 ENCOUNTER — Encounter: Payer: Self-pay | Admitting: Internal Medicine

## 2020-05-10 DIAGNOSIS — R5383 Other fatigue: Secondary | ICD-10-CM | POA: Diagnosis not present

## 2020-05-10 DIAGNOSIS — R5381 Other malaise: Secondary | ICD-10-CM | POA: Insufficient documentation

## 2020-05-10 DIAGNOSIS — Z791 Long term (current) use of non-steroidal anti-inflammatories (NSAID): Secondary | ICD-10-CM | POA: Insufficient documentation

## 2020-05-10 DIAGNOSIS — Z9221 Personal history of antineoplastic chemotherapy: Secondary | ICD-10-CM | POA: Insufficient documentation

## 2020-05-10 DIAGNOSIS — C661 Malignant neoplasm of right ureter: Secondary | ICD-10-CM | POA: Diagnosis not present

## 2020-05-10 DIAGNOSIS — F418 Other specified anxiety disorders: Secondary | ICD-10-CM | POA: Insufficient documentation

## 2020-05-10 DIAGNOSIS — K219 Gastro-esophageal reflux disease without esophagitis: Secondary | ICD-10-CM | POA: Insufficient documentation

## 2020-05-10 DIAGNOSIS — C189 Malignant neoplasm of colon, unspecified: Secondary | ICD-10-CM | POA: Insufficient documentation

## 2020-05-10 DIAGNOSIS — Z79899 Other long term (current) drug therapy: Secondary | ICD-10-CM | POA: Diagnosis not present

## 2020-05-10 DIAGNOSIS — Z8673 Personal history of transient ischemic attack (TIA), and cerebral infarction without residual deficits: Secondary | ICD-10-CM | POA: Diagnosis not present

## 2020-05-10 DIAGNOSIS — I739 Peripheral vascular disease, unspecified: Secondary | ICD-10-CM | POA: Insufficient documentation

## 2020-05-10 DIAGNOSIS — F17210 Nicotine dependence, cigarettes, uncomplicated: Secondary | ICD-10-CM | POA: Insufficient documentation

## 2020-05-10 DIAGNOSIS — B182 Chronic viral hepatitis C: Secondary | ICD-10-CM | POA: Diagnosis not present

## 2020-05-10 DIAGNOSIS — Z7982 Long term (current) use of aspirin: Secondary | ICD-10-CM | POA: Diagnosis not present

## 2020-05-10 DIAGNOSIS — J449 Chronic obstructive pulmonary disease, unspecified: Secondary | ICD-10-CM | POA: Diagnosis not present

## 2020-05-10 DIAGNOSIS — Z5112 Encounter for antineoplastic immunotherapy: Secondary | ICD-10-CM | POA: Diagnosis not present

## 2020-05-10 DIAGNOSIS — E785 Hyperlipidemia, unspecified: Secondary | ICD-10-CM | POA: Insufficient documentation

## 2020-05-10 DIAGNOSIS — D509 Iron deficiency anemia, unspecified: Secondary | ICD-10-CM | POA: Diagnosis not present

## 2020-05-10 DIAGNOSIS — Z515 Encounter for palliative care: Secondary | ICD-10-CM

## 2020-05-10 DIAGNOSIS — Z95828 Presence of other vascular implants and grafts: Secondary | ICD-10-CM

## 2020-05-10 LAB — CBC WITH DIFFERENTIAL/PLATELET
Abs Immature Granulocytes: 0.02 10*3/uL (ref 0.00–0.07)
Basophils Absolute: 0.1 10*3/uL (ref 0.0–0.1)
Basophils Relative: 1 %
Eosinophils Absolute: 0.4 10*3/uL (ref 0.0–0.5)
Eosinophils Relative: 5 %
HCT: 39 % (ref 39.0–52.0)
Hemoglobin: 12.7 g/dL — ABNORMAL LOW (ref 13.0–17.0)
Immature Granulocytes: 0 %
Lymphocytes Relative: 19 %
Lymphs Abs: 1.4 10*3/uL (ref 0.7–4.0)
MCH: 26.5 pg (ref 26.0–34.0)
MCHC: 32.6 g/dL (ref 30.0–36.0)
MCV: 81.4 fL (ref 80.0–100.0)
Monocytes Absolute: 0.8 10*3/uL (ref 0.1–1.0)
Monocytes Relative: 10 %
Neutro Abs: 4.7 10*3/uL (ref 1.7–7.7)
Neutrophils Relative %: 65 %
Platelets: 198 10*3/uL (ref 150–400)
RBC: 4.79 MIL/uL (ref 4.22–5.81)
RDW: 14.1 % (ref 11.5–15.5)
WBC: 7.4 10*3/uL (ref 4.0–10.5)
nRBC: 0 % (ref 0.0–0.2)

## 2020-05-10 LAB — COMPREHENSIVE METABOLIC PANEL
ALT: 18 U/L (ref 0–44)
AST: 28 U/L (ref 15–41)
Albumin: 3.6 g/dL (ref 3.5–5.0)
Alkaline Phosphatase: 103 U/L (ref 38–126)
Anion gap: 8 (ref 5–15)
BUN: 20 mg/dL (ref 8–23)
CO2: 25 mmol/L (ref 22–32)
Calcium: 8.4 mg/dL — ABNORMAL LOW (ref 8.9–10.3)
Chloride: 103 mmol/L (ref 98–111)
Creatinine, Ser: 0.92 mg/dL (ref 0.61–1.24)
GFR, Estimated: >60 mL/min (ref >60)
Glucose, Bld: 96 mg/dL (ref 70–99)
Potassium: 3.7 mmol/L (ref 3.5–5.1)
Sodium: 136 mmol/L (ref 135–145)
Total Bilirubin: 0.8 mg/dL (ref 0.3–1.2)
Total Protein: 7.6 g/dL (ref 6.5–8.1)

## 2020-05-10 LAB — TSH: TSH: 1.943 u[IU]/mL (ref 0.350–4.500)

## 2020-05-10 MED ORDER — SODIUM CHLORIDE 0.9 % IV SOLN
Freq: Once | INTRAVENOUS | Status: AC
  Filled 2020-05-10: qty 250

## 2020-05-10 MED ORDER — SODIUM CHLORIDE 0.9% FLUSH
10.0000 mL | Freq: Once | INTRAVENOUS | Status: AC
  Administered 2020-05-10: 10 mL via INTRAVENOUS
  Filled 2020-05-10: qty 10

## 2020-05-10 MED ORDER — HEPARIN SOD (PORK) LOCK FLUSH 100 UNIT/ML IV SOLN
500.0000 [IU] | Freq: Once | INTRAVENOUS | Status: AC
  Administered 2020-05-10: 500 [IU] via INTRAVENOUS
  Filled 2020-05-10: qty 5

## 2020-05-10 MED ORDER — HEPARIN SOD (PORK) LOCK FLUSH 100 UNIT/ML IV SOLN
INTRAVENOUS | Status: AC
  Filled 2020-05-10: qty 5

## 2020-05-10 MED ORDER — SODIUM CHLORIDE 0.9 % IV SOLN
200.0000 mg | Freq: Once | INTRAVENOUS | Status: AC
  Administered 2020-05-10: 200 mg via INTRAVENOUS
  Filled 2020-05-10: qty 8

## 2020-05-10 NOTE — Progress Notes (Signed)
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; # Colon ca- stage III  GOALS: palliative  CURRENT/MOST RECENT THERAPY: Keytruda [C]    Urothelial cancer ([place])   Initial Diagnosis   Urothelial cancer ([place])   Ureteral cancer, right ([place])  05/26/2018 Initial Diagnosis   Ureteral cancer, right ([place])   06/21/2018 -  Chemotherapy   The patient had pembrolizumab (KEYTRUDA) 200 mg in sodium chloride 0.9 % 50 mL chemo infusion, 200 mg, Intravenous, Once, 26 of 29 cycles Administration: 200 mg (06/21/2018), 200 mg (07/12/2018), 200 mg (08/02/2018), 200 mg (08/23/2018), 200 mg (09/13/2018), 200 mg (10/04/2018), 200 mg (10/25/2018), 200 mg (11/22/2018), 200 mg (12/13/2018), 200 mg (01/05/2019), 200 mg (01/24/2019), 200 mg (02/14/2019), 200 mg (03/11/2019), 200 mg (06/17/2019), 200 mg (08/26/2019), 200 mg (09/16/2019), 200 mg (10/07/2019), 200 mg (10/28/2019), 200 mg (11/24/2019), 200 mg (12/15/2019), 200 mg (01/05/2020), 200 mg (01/26/2020), 200 mg (02/16/2020), 200 mg (03/08/2020), 200 mg (03/29/2020), 200 mg (04/19/2020)  for chemotherapy treatment.       HISTORY OF PRESENTING ILLNESS: Patient is a poor historian.  Is alone.  Chad [name] 59 y.o.  male above history of stage IV-ureteral cancer/with multiple co-morbidities currently on Keytruda is here for follow-up.  Patient appetite is good.  Gaining weight.  Chronic mild shortness of breath chronic mild fatigue.  Not  any worse.  No diarrhea.  No skin rash.  No blood in urine.   Review of Systems  Constitutional: Positive for malaise/fatigue. Negative for chills, diaphoresis, fever and weight loss.  HENT: Negative for nosebleeds and sore throat.   Eyes: Negative for double vision.  Respiratory: Positive for cough and shortness of breath. Negative for hemoptysis and wheezing.   Cardiovascular: Negative for chest pain, palpitations and orthopnea.  Gastrointestinal: Negative for abdominal pain, blood in stool, constipation, diarrhea, heartburn, melena, nausea and vomiting.  Genitourinary: Negative for dysuria, frequency and urgency.  Musculoskeletal: Positive for back pain and joint pain.  Skin: Negative.  Negative for itching and rash.  Neurological: Positive for focal weakness. Negative for dizziness, tingling, weakness and headaches.       Chronic left-sided weakness upper than lower extremity.  Endo/Heme/Allergies: Does not bruise/bleed easily.  Psychiatric/Behavioral: Negative for depression. The patient is not nervous/anxious and does not have insomnia.      MEDICAL HISTORY:  Past Medical History:  Diagnosis Date  . Anemia   . Anxiety   . ARF (acute respiratory failure) ([place])   . Bladder cancer ([place])   . Cancer [facility])    prostate  . COPD (chronic obstructive pulmonary disease) ([place])   . Depression   . Dysphagia   . Family history of colon cancer   . Family history of kidney cancer   . Family history of leukemia   . Family history of prostate cancer   . GERD (gastroesophageal reflux disease)   . Hepatitis    chronic hep c  . Hydronephrosis   . Hydronephrosis with ureteral stricture   .  Hyperlipidemia   . Knee pain    Left  . Malignant neoplasm of colon ([place])   . Nerve pain   . Peripheral vascular disease ([place])   . Stroke ([place])   . Urinary frequency   . Venous hypertension of both lower extremities     SURGICAL HISTORY: Past Surgical History:  Procedure Laterality Date  .  COLON SURGERY     En bloc extended right hemicolectomy 07/2017  . CYSTOSCOPY W/ RETROGRADES Right 08/30/2018   Procedure: CYSTOSCOPY WITH RETROGRADE PYELOGRAM;  Surgeon: Hollice Espy, MD;  Location: [facility] OR[facility];  Service: Urology;  Laterality: Right;  . CYSTOSCOPY WITH STENT PLACEMENT Right 04/25/2018   Procedure: CYSTOSCOPY WITH STENT PLACEMENT;  Surgeon: Hollice Espy, MD;  Location: [facility];  Service: Urology;  Laterality: Right;  . CYSTOSCOPY WITH STENT PLACEMENT Right 08/30/2018   Procedure: [place] WITH STENT Exchange;  Surgeon: Hollice Espy, MD;  Location: [facility];  Service: Urology;  Laterality: Right;  . CYSTOSCOPY WITH STENT PLACEMENT Right 03/07/2019   Procedure: CYSTOSCOPY WITH STENT Exchange;  Surgeon: Hollice Espy, MD;  Location: [facility];  Service: Urology;  Laterality: Right;  . CYSTOSCOPY WITH STENT PLACEMENT Right 11/21/2019   Procedure: CYSTOSCOPY WITH STENT Exchange;  Surgeon: Hollice Espy, MD;  Location: [facility];  Service: Urology;  Laterality: Right;  . LOWER EXTREMITY ANGIOGRAPHY Left 05/23/2019   Procedure: LOWER EXTREMITY ANGIOGRAPHY;  Surgeon: Algernon Huxley, MD;  Location: [place] [facility];  Service: Cardiovascular;  Laterality: Left;  . LOWER EXTREMITY ANGIOGRAPHY Right 05/30/2019   Procedure: LOWER EXTREMITY ANGIOGRAPHY;  Surgeon: Algernon Huxley, MD;  Location: [place] [facility];  Service: Cardiovascular;  Laterality: Right;  . LOWER EXTREMITY ANGIOGRAPHY Right 02/13/2020   Procedure: LOWER EXTREMITY ANGIOGRAPHY;  Surgeon: Algernon Huxley, MD;  Location: [place] [facility];  Service: Cardiovascular;  Laterality: Right;  . LOWER EXTREMITY ANGIOGRAPHY Left 02/20/2020   Procedure: LOWER EXTREMITY ANGIOGRAPHY;  Surgeon: Algernon Huxley, MD;  Location: [place] [facility];  Service: Cardiovascular;  Laterality: Left;  . PORTA CATH INSERTION N/A 02/28/2019   Procedure: PORTA CATH INSERTION;  Surgeon: Algernon Huxley, MD;  Location: [place] [facility];  Service:  Cardiovascular;  Laterality: N/A;  . tumor removed       SOCIAL HISTORY: Social History   Socioeconomic History  . Marital status: Single    Spouse name: Not on file  . Number of children: Not on file  . Years of education: Not on file  . Highest education level: Not on file  Occupational History  . Not on file  Tobacco Use  . Smoking status: Current Every Day Smoker    Packs/day: 1.00    Types: Cigarettes  . Smokeless tobacco: Never Used  Vaping Use  . Vaping Use: Never used  Substance and Sexual Activity  . Alcohol use: Not Currently  . Drug use: Not Currently  . Sexual activity: Not Currently  Other Topics Concern  . Not on file  Social History Narrative    used to live [place]; moved  To [place]- end of April 2019; in Nursing home; 1pp/day; quit alcohol. 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        Family- dad- prostate ca [at 55y]; brother- 78 died of prostate cancer; brother- 77- no cancers [[place]]; sonGerald Stabs [[place]];[name]-32y prostate ca [facility] [place]]; daughter- 33 [[place]]; another daughter 69 [[place]/addict]. will refer genetics counseling. Given MSI- abnormal; highly suspicious of Lynch syndrome.  Patient's son [name] aware  of high possible lynch syndrome.   Social Determinants of Health   Financial Resource Strain:   . Difficulty of Paying Living Expenses: Not on file  Food Insecurity:   . Worried About Charity fundraiser in the Last Year: Not on file  . Ran Out of Food in the Last Year: Not on file  Transportation Needs:   . Lack of Transportation (Medical): Not on file  . Lack of Transportation (Non-Medical): Not on file  Physical Activity:   . Days of Exercise per Week: Not on file  . Minutes of Exercise per Session: Not on file  Stress:   . Feeling of Stress : Not on file  Social Connections:   . Frequency of Communication with Friends and Family: Not on file  . Frequency of Social Gatherings with Friends and Family: Not on file  . Attends Religious Services: Not  on file  . Active Member of Clubs or Organizations: Not on file  . Attends Archivist Meetings: Not on file  . Marital Status: Not on file  Intimate Partner Violence:   . Fear of Current or Ex-Partner: Not on file  . Emotionally Abused: Not on file  . Physically Abused: Not on file  . Sexually Abused: Not on file    FAMILY HISTORY: Family History  Problem Relation Age of Onset  . Prostate cancer Father 57  . Cancer Brother 30       unsure type  . Cancer Paternal Uncle        unsure type  . Cancer Maternal Grandmother        unsure type  . Cancer Paternal Grandmother        unsure type  . Kidney cancer Paternal Grandfather   . Cancer Other        unsure types  . Leukemia Son   . Cancer Son        other cancers, possibly colon    ALLERGIES:  is allergic to penicillins.  MEDICATIONS:  Current Outpatient Medications  Medication Sig Dispense Refill  . acetaminophen (TYLENOL) 325 MG tablet Take 650 mg by mouth 3 (three) times daily.     Marland Kitchen aspirin EC 81 MG tablet Take 1 tablet (81 mg total) by mouth daily. 150 tablet 2  . atorvastatin (LIPITOR) 10 MG tablet Take 1 tablet (10 mg total) by mouth daily. 30 tablet 11  . clopidogrel (PLAVIX) 75 MG tablet Take 1 tablet (75 mg total) by mouth daily. 30 tablet 11  . collagenase (SANTYL) ointment Apply 1 application topically daily. LEFT LOWER LEG    . cyclobenzaprine (FLEXERIL) 10 MG tablet Take 10 mg by mouth at bedtime.    . diclofenac sodium (VOLTAREN) 1 % GEL Apply 2 g topically See admin instructions. Apply 2 g topically 4 times daily. Apply 2 g to left shoulder, apply 2 g to left hip and 2 g to left knee 4 times daily per [facility] instructions    . escitalopram (LEXAPRO) 5 MG tablet Take 5 mg by mouth daily.     Marland Kitchen gabapentin (NEURONTIN) 100 MG capsule Take 100 mg by mouth 3 (three) times daily.    . mirtazapine (REMERON) 7.5 MG tablet Take 7.5 mg by mouth at bedtime.    Marland Kitchen oxycodone (OXY-IR) 5 MG capsule Take 10 mg by mouth  every 6 (six) hours as needed for pain.     . pantoprazole (PROTONIX) 40 MG tablet Take 40 mg by mouth daily.     No  current facility-administered medications for this visit.   Facility-Administered Medications Ordered in Other Visits  Medication Dose Route Frequency Provider Last Rate Last Admin  . heparin lock flush 100 unit/mL  500 Units Intravenous Once Charlaine Dalton R, MD          .  PHYSICAL EXAMINATION: ECOG PERFORMANCE STATUS: 1 - Symptomatic but completely ambulatory  Vitals:   05/10/20 0921  BP: 119/85  Pulse: 80  Resp: 16  Temp: (!) 97.2 F (36.2 C)  SpO2: 100%   Filed Weights   05/10/20 0921  Weight: 184 lb (83.5 kg)    Physical Exam Constitutional:      Comments: In wheelchair. Cachectic.  Appears older than his stated age.  Alone.  HENT:     Head: Normocephalic and atraumatic.     Mouth/Throat:     Pharynx: No oropharyngeal exudate.  Eyes:     Pupils: Pupils are equal, round, and reactive to light.  Cardiovascular:     Rate and Rhythm: Normal rate and regular rhythm.  Pulmonary:     Effort: No respiratory distress.     Breath sounds: No wheezing.  Abdominal:     General: Bowel sounds are normal. There is no distension.     Palpations: Abdomen is soft. There is no mass.     Tenderness: There is no abdominal tenderness. There is no guarding or rebound.  Musculoskeletal:        General: No tenderness. Normal range of motion.     Cervical back: Normal range of motion and neck supple.  Skin:    General: Skin is warm.     Comments: Bilateral lower extremity ulcerations noted.  Pulses intact bilaterally.  Neurological:     Mental Status: He is oriented to person, place, and time.     Comments: Sleepy but easily arousable.  Chronic weakness left upper and lower extremity.  Psychiatric:        Mood and Affect: Affect normal.      LABORATORY DATA:  I have reviewed the data as listed Lab Results  Component Value Date   WBC 7.4 05/10/2020    HGB 12.7 (L) 05/10/2020   HCT 39.0 05/10/2020   MCV 81.4 05/10/2020   PLT 198 05/10/2020   Recent Labs    03/08/20 0905 03/08/20 0905 03/29/20 0824 04/19/20 0858 05/10/20 0915  NA 137   < > 139 134* 136  K 4.4   < > 4.1 3.8 3.7  CL 103   < > 104 101 103  CO2 25   < > '24 26 25  ' GLUCOSE 88   < > 94 140* 96  BUN 18   < > '21 19 20  ' CREATININE 0.85   < > 0.76 0.92 0.92  CALCIUM 8.8*   < > 8.6* 8.4* 8.4*  GFRNONAA >60   < > >60 >60 >60  GFRAA >60  --  >60 >60  --   PROT 8.0   < > 7.7 7.8 7.6  ALBUMIN 3.8   < > 3.6 3.7 3.6  AST 32   < > 28 33 28  ALT 26   < > '20 24 18  ' ALKPHOS 127*   < > 118 115 103  BILITOT 0.6   < > 0.4 0.4 0.8   < > = values in this interval not displayed.    RADIOGRAPHIC STUDIES: I have personally reviewed the radiological images as listed and agreed with the findings in the report. CT Abdomen Pelvis W  Contrast  Result Date: 04/17/2020 CLINICAL DATA:  History of resected right colon cancer in 2019. History of right renal pelvis/ureteral urothelial carcinoma status post stent placement. Restaging. EXAM: CT ABDOMEN AND PELVIS WITH CONTRAST TECHNIQUE: Multidetector CT imaging of the abdomen and pelvis was performed using the standard protocol following bolus administration of intravenous contrast. CONTRAST:  182m OMNIPAQUE IOHEXOL 300 MG/ML  SOLN COMPARISON:  12/09/2019 CT chest, abdomen and pelvis. FINDINGS: Lower chest: No significant pulmonary nodules or acute consolidative airspace disease. Hepatobiliary: Normal liver size. No liver mass. Normal gallbladder with no radiopaque cholelithiasis. No biliary ductal dilatation. Pancreas: Normal, with no mass or duct dilation. Spleen: Normal size. No mass. Adrenals/Urinary Tract: Normal adrenals. Progressive asymmetric right renal atrophy. Well-positioned right nephroureteral stent with proximal pigtail portion in the right renal pelvis and distal pigtail portion in the bladder. Diffuse urothelial thickening and enhancement  throughout central right renal collecting system and right renal pelvis and right lumbar ureter with hazy stranding of the surrounding fat, not substantially changed. Stable mild right hydronephrosis. No left hydronephrosis. Simple 1.9 cm anterior lower left renal cyst. Subcentimeter hypodense renal cortical lesion in the posterior upper right kidney is too small to characterize and is unchanged, considered benign. No new renal lesions. Normal bladder. Stomach/Bowel: Normal non-distended stomach. Stable postsurgical changes from right hemicolectomy with intact appearing ileocolic anastomosis in mid abdomen. No dilated or thick-walled small bowel loops. Oral contrast transits to the left colon. No definite large bowel wall thickening, colonic diverticulosis or acute pericolonic fat stranding. Moderate stool in the remnant large-bowel. Vascular/Lymphatic: Atherosclerotic nonaneurysmal abdominal aorta. Patent portal, splenic, hepatic and renal veins. Mildly enlarged bilateral inguinal lymph nodes up to 1.4 cm on the left (series 2/image 95), all stable. Otherwise no pathologically enlarged abdominopelvic nodes. Reproductive: Borderline mild prostatomegaly. Other: No pneumoperitoneum, ascites or focal fluid collection. Musculoskeletal: No aggressive appearing focal osseous lesions. IMPRESSION: 1. Progressive asymmetric right renal atrophy. Well-positioned right nephroureteral stent. Stable mild right hydronephrosis. Stable diffuse urothelial thickening and enhancement throughout the central right renal collecting system, right renal pelvis and right lumbar ureter with hazy stranding of the surrounding fat, compatible with stable upper tract urothelial malignancy. 2. No findings highly suspicious for metastatic disease in the abdomen or pelvis. Nonspecific stable mild bilateral inguinal lymphadenopathy. 3. Aortic Atherosclerosis (ICD10-I70.0). Electronically Signed   By: JIlona SorrelM.D.   On: 04/17/2020 12:44     ASSESSMENT & PLAN:   Ureteral cancer, right ([place] # High-grade urothelial cancer/cytology; likely of the right renal pelvis /upper ureter. SEP 21st 2021 C/A/P-stable; Slight ureteral thickening/small pelvic/retroperitoneal lymph nodes.  No lesions in the liver. STABLE  # Proceed [place]; Labs today reviewed;  acceptable for treatment today.   # Iron deficiency anemia-hemoglobin 12.7- STABLE  # Bilateral LE ulcers-s/p wound care evaluation; s/p PTCA with Dr.Dew. bil Arterial Dopp-wnl. STABLE   #DISPOSITION: #Beryle Flocktoday;  # follow up in 3 weeks -MD labs-cbc/cmp  Keytruda-- Dr.B     All questions were answered. The patient knows to call the clinic with any problems, questions or concerns.    GCammie Sickle MD 05/10/2020 9:50 AM

## 2020-05-10 NOTE — Assessment & Plan Note (Signed)
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 SEP 21st 2021 C/A/P-stable; Slight ureteral thickening/small pelvic/retroperitoneal lymph nodes.  No lesions in the liver. STABLE  # Proceed with [place] today; Labs today reviewed;  acceptable for treatment today.   # Iron deficiency anemia-hemoglobin 12.7- STABLE  # Bilateral LE ulcers-s/p wound care evaluation; s/p PTCA with Dr.Dew. bil Arterial Dopp-wnl. STABLE   #DISPOSITION: Chad [name] today;  # follow up in 3 weeks -MD labs-cbc/cmp  Keytruda-- Dr.B

## 2020-05-23 ENCOUNTER — Encounter: Payer: Self-pay | Admitting: Nurse Practitioner

## 2020-05-23 ENCOUNTER — Non-Acute Institutional Stay: Payer: Medicaid Other | Admitting: Nurse Practitioner

## 2020-05-23 DIAGNOSIS — C661 Malignant neoplasm of right ureter: Secondary | ICD-10-CM

## 2020-05-23 DIAGNOSIS — Z515 Encounter for palliative care: Secondary | ICD-10-CM

## 2020-05-23 NOTE — Progress Notes (Signed)
[facility] Consult Note Telephone: [phone]  Fax: [phone]  PATIENT NAME: Chad [name] DOB: 1960/10/07 MRN: [id number] PROVIDER:Dr Hodges/Jobos Winifred and PLAN: 1.ACP: DNR; treat what is treatable including hospitalizations if necessary  2.Anorexia secondary to protein calorie malnutritionrelated to cancerimprovingongoing.Continue to monitor daily weights, supplements, supportive measures and courage to eat  3.Chronic pain monitor on pain scale, monitor efficacy vs adverse side effects.Will continue to monitor; encouraged to ask when in pain, Continue to follow by psychotherapy and psychiatry for counseling; supportive services.  4.Palliative care encounter;Palliative medicine team will continue to support patient, patient's family, and medical team. Visit consisted of counseling and education dealing with the complex and emotionally intense issues of symptom management and palliative care in the setting of serious and potentially life-threatening illness  5. F/u 2 months for ongoing support, monitoring pain, weight, disease progression  I spent 60 minutes providing this consultation, starting at 11:30am More than 50% of the time in this consultation was spent coordinating communication.   HISTORY OF PRESENT ILLNESS:  Chad [name] is a 59 y.o. year old male with multiple medical problems including lateonset CVA, urothelial cancer, prostate cancer, chronic hepatitis C, hyperlipidemia, colon surgery, protein calorie malnutrition, anxiety, depression. Mr. Dumond continues to reside in [place] at [facility]. Mr. Rosenberg does transferred to the wheelchair, assisted in performing adl's so does require some assistance with bathing. Mr. Graumann does feed himself an appetite has improved, depends on what is being served..  04/12/2020 recurrent depression on Escitalopram and Remeron. No changes as t this visit. Wound care saw at facility 04/30/2020 for Left mal medical and right foot dorsal. Mr. Biven continues to go to the cancer center for his infusions. Staff endorses no new changes our concerns. At present Mr. Meroney is sitting in the wheelchair in his room. Mr. Ptacek appears chronically ill, debilitated but no distress. No visitors present. I visited and observed Mr. Smeltzer. We talked about purpose of palliative care visit. Mr. Schwandt agreement. We talked about how he was feeling today. Mr. Nikolai endorses he is he doing okay. Mr Dowell endorses the new smoking rules have been a little challenging. Mr Pollio did spend most of his time on the smoking patio prior to this change. We talked about smoking cessation for which Mr Tiegs declined. We talked about his appetite at length. We talked about the importance of nutrition. We talked about symptoms of pain which is currently being managed with current regiment. We talked about his wounds. We talked about his oncology visit and ongoing treatment plan. We talked about the challenges residing at skilled facility and coping strategies, quality of life. We talked about medical goals of care. Will continue current goals DNR, continue infusions, active treatment, treat what is treatable. We talked about his son. We talked about role palliative care and plan of care. Therapeutic listening, emotional support provided. Questions answered to satisfaction. I have updated nursing staff new new changes that present time will follow monitor with next visit in two months if needed or sooner should he declined.  Dr Michaelene Song Oncology visit 05/10/2020 per documentation:  ASSESSMENT & PLAN:   "Ureteral cancer, right ([place]) # High-grade urothelial cancer/cytology; likely of the right renal pelvis /upper ureter. SEP 21st 2021 C/A/P-stable; Slight ureteral thickening/small  pelvic/retroperitoneal lymph nodes.  No lesions in the liver. STABLE  # Proceed with [place] today; Labs today reviewed;  acceptable for treatment  today.   # Iron deficiency anemia-hemoglobin 12.7- STABLE  # Bilateral LE ulcers-s/p wound care evaluation; s/p PTCA with Dr.Dew. bil Arterial Dopp-wnl. STABLE   #DISPOSITION: # [name] today;  # follow up in 3 weeks -MD labs-cbc/cmp  Keytruda-"  Palliative Care was asked to help to continue to address goals of care.   CODE STATUS: DNR  PPS: 50% HOSPICE ELIGIBILITY/DIAGNOSIS: TBD  PAST MEDICAL HISTORY:  Past Medical History:  Diagnosis Date  . Anemia   . Anxiety   . ARF (acute respiratory failure) ([place])   . Bladder cancer ([place])   . Cancer [facility])    prostate  . COPD (chronic obstructive pulmonary disease) ([place])   . Depression   . Dysphagia   . Family history of colon cancer   . Family history of kidney cancer   . Family history of leukemia   . Family history of prostate cancer   . GERD (gastroesophageal reflux disease)   . Hepatitis    chronic hep c  . Hydronephrosis   . Hydronephrosis with ureteral stricture   . Hyperlipidemia   . Knee pain    Left  . Malignant neoplasm of colon ([place])   . Nerve pain   . Peripheral vascular disease ([place])   . Stroke ([place])   . Urinary frequency   . Venous hypertension of both lower extremities     SOCIAL HX:  Social History   Tobacco Use  . Smoking status: Current Every Day Smoker    Packs/day: 1.00    Types: Cigarettes  . Smokeless tobacco: Never Used  Substance Use Topics  . Alcohol use: Not Currently    ALLERGIES:  Allergies  Allergen Reactions  . Penicillins Rash     PHYSICAL EXAM:   General: chronically ill, w/c dependent, pleasant male Cardiovascular: regular rate and rhythm Pulmonary: clear ant fields Neurological: w/c dependent  Delayza Lungren Ihor Gully, NP

## 2020-05-24 ENCOUNTER — Other Ambulatory Visit: Payer: Self-pay

## 2020-05-31 ENCOUNTER — Encounter: Payer: Self-pay | Admitting: Internal Medicine

## 2020-05-31 ENCOUNTER — Other Ambulatory Visit: Payer: Self-pay

## 2020-05-31 ENCOUNTER — Inpatient Hospital Stay: Payer: Medicaid Other

## 2020-05-31 ENCOUNTER — Inpatient Hospital Stay (HOSPITAL_BASED_OUTPATIENT_CLINIC_OR_DEPARTMENT_OTHER): Payer: Medicaid Other | Admitting: Internal Medicine

## 2020-05-31 ENCOUNTER — Inpatient Hospital Stay: Payer: Medicaid Other | Attending: Internal Medicine

## 2020-05-31 DIAGNOSIS — R5381 Other malaise: Secondary | ICD-10-CM | POA: Insufficient documentation

## 2020-05-31 DIAGNOSIS — R5383 Other fatigue: Secondary | ICD-10-CM | POA: Insufficient documentation

## 2020-05-31 DIAGNOSIS — I10 Essential (primary) hypertension: Secondary | ICD-10-CM | POA: Diagnosis not present

## 2020-05-31 DIAGNOSIS — R531 Weakness: Secondary | ICD-10-CM | POA: Insufficient documentation

## 2020-05-31 DIAGNOSIS — Z791 Long term (current) use of non-steroidal anti-inflammatories (NSAID): Secondary | ICD-10-CM | POA: Diagnosis not present

## 2020-05-31 DIAGNOSIS — D509 Iron deficiency anemia, unspecified: Secondary | ICD-10-CM | POA: Diagnosis not present

## 2020-05-31 DIAGNOSIS — Z8042 Family history of malignant neoplasm of prostate: Secondary | ICD-10-CM | POA: Diagnosis not present

## 2020-05-31 DIAGNOSIS — I739 Peripheral vascular disease, unspecified: Secondary | ICD-10-CM | POA: Insufficient documentation

## 2020-05-31 DIAGNOSIS — Z8673 Personal history of transient ischemic attack (TIA), and cerebral infarction without residual deficits: Secondary | ICD-10-CM | POA: Insufficient documentation

## 2020-05-31 DIAGNOSIS — B182 Chronic viral hepatitis C: Secondary | ICD-10-CM | POA: Insufficient documentation

## 2020-05-31 DIAGNOSIS — C661 Malignant neoplasm of right ureter: Secondary | ICD-10-CM | POA: Diagnosis not present

## 2020-05-31 DIAGNOSIS — K219 Gastro-esophageal reflux disease without esophagitis: Secondary | ICD-10-CM | POA: Diagnosis not present

## 2020-05-31 DIAGNOSIS — Z5112 Encounter for antineoplastic immunotherapy: Secondary | ICD-10-CM | POA: Insufficient documentation

## 2020-05-31 DIAGNOSIS — J449 Chronic obstructive pulmonary disease, unspecified: Secondary | ICD-10-CM | POA: Diagnosis not present

## 2020-05-31 DIAGNOSIS — Z515 Encounter for palliative care: Secondary | ICD-10-CM

## 2020-05-31 DIAGNOSIS — F17210 Nicotine dependence, cigarettes, uncomplicated: Secondary | ICD-10-CM | POA: Diagnosis not present

## 2020-05-31 DIAGNOSIS — Z7982 Long term (current) use of aspirin: Secondary | ICD-10-CM | POA: Diagnosis not present

## 2020-05-31 DIAGNOSIS — E785 Hyperlipidemia, unspecified: Secondary | ICD-10-CM | POA: Insufficient documentation

## 2020-05-31 DIAGNOSIS — Z9221 Personal history of antineoplastic chemotherapy: Secondary | ICD-10-CM | POA: Insufficient documentation

## 2020-05-31 DIAGNOSIS — Z800 Family history of malignant neoplasm of digestive organs: Secondary | ICD-10-CM | POA: Diagnosis not present

## 2020-05-31 DIAGNOSIS — Z79899 Other long term (current) drug therapy: Secondary | ICD-10-CM | POA: Insufficient documentation

## 2020-05-31 DIAGNOSIS — Z85038 Personal history of other malignant neoplasm of large intestine: Secondary | ICD-10-CM | POA: Insufficient documentation

## 2020-05-31 DIAGNOSIS — Z7189 Other specified counseling: Secondary | ICD-10-CM

## 2020-05-31 LAB — CBC WITH DIFFERENTIAL/PLATELET
Abs Immature Granulocytes: 0.02 10*3/uL (ref 0.00–0.07)
Basophils Absolute: 0.1 10*3/uL (ref 0.0–0.1)
Basophils Relative: 1 %
Eosinophils Absolute: 0.5 10*3/uL (ref 0.0–0.5)
Eosinophils Relative: 5 %
HCT: 39.7 % (ref 39.0–52.0)
Hemoglobin: 13.1 g/dL (ref 13.0–17.0)
Immature Granulocytes: 0 %
Lymphocytes Relative: 16 %
Lymphs Abs: 1.4 10*3/uL (ref 0.7–4.0)
MCH: 26.8 pg (ref 26.0–34.0)
MCHC: 33 g/dL (ref 30.0–36.0)
MCV: 81.4 fL (ref 80.0–100.0)
Monocytes Absolute: 0.7 10*3/uL (ref 0.1–1.0)
Monocytes Relative: 8 %
Neutro Abs: 6.4 10*3/uL (ref 1.7–7.7)
Neutrophils Relative %: 70 %
Platelets: 287 10*3/uL (ref 150–400)
RBC: 4.88 MIL/uL (ref 4.22–5.81)
RDW: 13.6 % (ref 11.5–15.5)
WBC: 9.1 10*3/uL (ref 4.0–10.5)
nRBC: 0 % (ref 0.0–0.2)

## 2020-05-31 LAB — COMPREHENSIVE METABOLIC PANEL
ALT: 20 U/L (ref 0–44)
AST: 27 U/L (ref 15–41)
Albumin: 3.5 g/dL (ref 3.5–5.0)
Alkaline Phosphatase: 109 U/L (ref 38–126)
Anion gap: 9 (ref 5–15)
BUN: 18 mg/dL (ref 8–23)
CO2: 25 mmol/L (ref 22–32)
Calcium: 8.8 mg/dL — ABNORMAL LOW (ref 8.9–10.3)
Chloride: 102 mmol/L (ref 98–111)
Creatinine, Ser: 0.98 mg/dL (ref 0.61–1.24)
GFR, Estimated: >60 mL/min (ref >60)
Glucose, Bld: 99 mg/dL (ref 70–99)
Potassium: 4.1 mmol/L (ref 3.5–5.1)
Sodium: 136 mmol/L (ref 135–145)
Total Bilirubin: 0.5 mg/dL (ref 0.3–1.2)
Total Protein: 7.8 g/dL (ref 6.5–8.1)

## 2020-05-31 MED ORDER — HEPARIN SOD (PORK) LOCK FLUSH 100 UNIT/ML IV SOLN
500.0000 [IU] | Freq: Once | INTRAVENOUS | Status: AC | PRN
  Administered 2020-05-31: 500 [IU]
  Filled 2020-05-31: qty 5

## 2020-05-31 MED ORDER — SODIUM CHLORIDE 0.9 % IV SOLN
Freq: Once | INTRAVENOUS | Status: AC
  Filled 2020-05-31: qty 250

## 2020-05-31 MED ORDER — SODIUM CHLORIDE 0.9 % IV SOLN
200.0000 mg | Freq: Once | INTRAVENOUS | Status: AC
  Administered 2020-05-31: 200 mg via INTRAVENOUS
  Filled 2020-05-31: qty 8

## 2020-05-31 NOTE — Progress Notes (Signed)
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; # Colon ca- stage III  GOALS: palliative  CURRENT/MOST RECENT THERAPY: Keytruda [C]    Urothelial cancer ([place])   Initial Diagnosis   Urothelial cancer ([place])   Ureteral cancer, right ([place])  05/26/2018 Initial Diagnosis   Ureteral cancer, right ([place])   06/21/2018 -  Chemotherapy   The patient had pembrolizumab (KEYTRUDA) 200 mg in sodium chloride 0.9 % 50 mL chemo infusion, 200 mg, Intravenous, Once, 28 of 29 cycles Administration: 200 mg (06/21/2018), 200 mg (07/12/2018), 200 mg (08/02/2018), 200 mg (08/23/2018), 200 mg (09/13/2018), 200 mg (10/04/2018), 200 mg (10/25/2018), 200 mg (11/22/2018), 200 mg (12/13/2018), 200 mg (01/05/2019), 200 mg (01/24/2019), 200 mg (02/14/2019), 200 mg (03/11/2019), 200 mg (06/17/2019), 200 mg (08/26/2019), 200 mg (09/16/2019), 200 mg (10/07/2019), 200 mg (10/28/2019), 200 mg (11/24/2019), 200 mg (12/15/2019), 200 mg (01/05/2020), 200 mg (01/26/2020), 200 mg (02/16/2020), 200 mg (03/08/2020), 200 mg (03/29/2020), 200 mg (04/19/2020), 200 mg (05/10/2020), 200 mg (05/31/2020)  for chemotherapy treatment.       HISTORY OF PRESENTING ILLNESS: Patient is a poor historian.  Is alone.  Chad [name] 59 y.o.  male above history of stage IV-ureteral cancer/with multiple co-morbidities currently on Keytruda is here for follow-up.  Patient denies any nausea vomiting or diarrhea.  Denies any  blood in urine.  No skin rash.  Chronic shortness of breath chronic cough.  Appetite is good.  No weight loss.  Feels tired feels sleepy this morning.    Review of Systems  Constitutional: Positive for malaise/fatigue. Negative for chills, diaphoresis, fever and weight loss.  HENT: Negative for nosebleeds and sore throat.   Eyes: Negative for double vision.  Respiratory: Positive for cough and shortness of breath. Negative for hemoptysis and wheezing.   Cardiovascular: Negative for chest pain, palpitations and orthopnea.  Gastrointestinal: Negative for abdominal pain, blood in stool, constipation, diarrhea, heartburn, melena, nausea and vomiting.  Genitourinary: Negative for dysuria, frequency and urgency.  Musculoskeletal: Positive for back pain and joint pain.  Skin: Negative.  Negative for itching and rash.  Neurological: Positive for focal weakness. Negative for dizziness, tingling, weakness and headaches.       Chronic left-sided weakness upper than lower extremity.  Endo/Heme/Allergies: Does not bruise/bleed easily.  Psychiatric/Behavioral: Negative for depression. The patient is not nervous/anxious and does not have insomnia.      MEDICAL HISTORY:  Past Medical History:  Diagnosis Date  . Anemia   . Anxiety   . ARF (acute respiratory failure) ([place])   . Bladder cancer ([place])   . Cancer [facility])    prostate  . COPD (chronic obstructive pulmonary disease) ([place])   . Depression   . Dysphagia   . Family history of colon cancer   . Family history of kidney cancer   . Family history of leukemia   . Family history of prostate cancer   . GERD (gastroesophageal reflux disease)   . Hepatitis  chronic hep c  . Hydronephrosis   . Hydronephrosis with ureteral stricture   . Hyperlipidemia   . Knee pain    Left  . Malignant neoplasm of colon ([place])   . Nerve pain   . Peripheral vascular disease ([place])   . Stroke ([place])   . Urinary frequency   . Venous hypertension of both lower  extremities     SURGICAL HISTORY: Past Surgical History:  Procedure Laterality Date  . COLON SURGERY     En bloc extended right hemicolectomy 07/2017  . CYSTOSCOPY W/ RETROGRADES Right 08/30/2018   Procedure: CYSTOSCOPY WITH RETROGRADE PYELOGRAM;  Surgeon: Hollice Espy, MD;  Location: [facility] ORS;  Service: Urology;  Laterality: Right;  . CYSTOSCOPY WITH STENT PLACEMENT Right 04/25/2018   Procedure: CYSTOSCOPY WITH STENT PLACEMENT;  Surgeon: Hollice Espy, MD;  Location: [facility] OR[facility];  Service: Urology;  Laterality: Right;  . CYSTOSCOPY WITH STENT PLACEMENT Right 08/30/2018   Procedure: [facility] WITH STENT Exchange;  Surgeon: Hollice Espy, MD;  Location: [facility] OR[facility];  Service: Urology;  Laterality: Right;  . CYSTOSCOPY WITH STENT PLACEMENT Right 03/07/2019   Procedure: CYSTOSCOPY WITH STENT Exchange;  Surgeon: Hollice Espy, MD;  Location: [facility];  Service: Urology;  Laterality: Right;  . CYSTOSCOPY WITH STENT PLACEMENT Right 11/21/2019   Procedure: CYSTOSCOPY WITH STENT Exchange;  Surgeon: Hollice Espy, MD;  Location: [facility];  Service: Urology;  Laterality: Right;  . LOWER EXTREMITY ANGIOGRAPHY Left 05/23/2019   Procedure: LOWER EXTREMITY ANGIOGRAPHY;  Surgeon: Algernon Huxley, MD;  Location: [place] [facility];  Service: Cardiovascular;  Laterality: Left;  . LOWER EXTREMITY ANGIOGRAPHY Right 05/30/2019   Procedure: LOWER EXTREMITY ANGIOGRAPHY;  Surgeon: Algernon Huxley, MD;  Location: [place] [facility];  Service: Cardiovascular;  Laterality: Right;  . LOWER EXTREMITY ANGIOGRAPHY Right 02/13/2020   Procedure: LOWER EXTREMITY ANGIOGRAPHY;  Surgeon: Algernon Huxley, MD;  Location: [place] [facility];  Service: Cardiovascular;  Laterality: Right;  . LOWER EXTREMITY ANGIOGRAPHY Left 02/20/2020   Procedure: LOWER EXTREMITY ANGIOGRAPHY;  Surgeon: Algernon Huxley, MD;  Location: [place] [facility];  Service: Cardiovascular;  Laterality: Left;  . PORTA CATH INSERTION N/A 02/28/2019   Procedure: PORTA  CATH INSERTION;  Surgeon: Algernon Huxley, MD;  Location: [place] [facility];  Service: Cardiovascular;  Laterality: N/A;  . tumor removed       SOCIAL HISTORY: Social History   Socioeconomic History  . Marital status: Single    Spouse name: Not on file  . Number of children: Not on file  . Years of education: Not on file  . Highest education level: Not on file  Occupational History  . Not on file  Tobacco Use  . Smoking status: Current Every Day Smoker    Packs/day: 1.00    Types: Cigarettes  . Smokeless tobacco: Never Used  Vaping Use  . Vaping Use: Never used  Substance and Sexual Activity  . Alcohol use: Not Currently  . Drug use: Not Currently  . Sexual activity: Not Currently  Other Topics Concern  . Not on file  Social History Narrative    used to live [place]; moved  To [place]- end of April 2019; in Nursing home; 1pp/day; quit alcohol. 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        Family- dad- prostate ca [at 88y]; brother- 2 died of prostate cancer; brother- 37- no cancers [[place]]; sonGerald Stabs [[place]];[name]-32y prostate ca [facility] [place]]; daughter- 35 [[place]]; another daughter 31 [[place]/addict]. will  refer genetics counseling. Given MSI- abnormal; highly suspicious of Lynch syndrome.  Patient's son [name] aware of high possible lynch syndrome.   Social Determinants of Health   Financial Resource Strain:   . Difficulty of Paying Living Expenses: Not on file  Food Insecurity:   . Worried About Charity fundraiser in the Last Year: Not on file  . Ran Out of Food in the Last Year: Not on file  Transportation Needs:   . Lack of Transportation (Medical): Not on file  . Lack of Transportation (Non-Medical): Not on file  Physical Activity:   . Days of Exercise per Week: Not on file  . Minutes of Exercise per Session: Not on file  Stress:   . Feeling of Stress : Not on file  Social Connections:   . Frequency of Communication with Friends and Family: Not on file  . Frequency of  Social Gatherings with Friends and Family: Not on file  . Attends Religious Services: Not on file  . Active Member of Clubs or Organizations: Not on file  . Attends Archivist Meetings: Not on file  . Marital Status: Not on file  Intimate Partner Violence:   . Fear of Current or Ex-Partner: Not on file  . Emotionally Abused: Not on file  . Physically Abused: Not on file  . Sexually Abused: Not on file    FAMILY HISTORY: Family History  Problem Relation Age of Onset  . Prostate cancer Father 105  . Cancer Brother 56       unsure type  . Cancer Paternal Uncle        unsure type  . Cancer Maternal Grandmother        unsure type  . Cancer Paternal Grandmother        unsure type  . Kidney cancer Paternal Grandfather   . Cancer Other        unsure types  . Leukemia Son   . Cancer Son        other cancers, possibly colon    ALLERGIES:  is allergic to penicillins.  MEDICATIONS:  Current Outpatient Medications  Medication Sig Dispense Refill  . acetaminophen (TYLENOL) 325 MG tablet Take 650 mg by mouth 3 (three) times daily.     Marland Kitchen aspirin EC 81 MG tablet Take 1 tablet (81 mg total) by mouth daily. 150 tablet 2  . atorvastatin (LIPITOR) 10 MG tablet Take 1 tablet (10 mg total) by mouth daily. 30 tablet 11  . clopidogrel (PLAVIX) 75 MG tablet Take 1 tablet (75 mg total) by mouth daily. 30 tablet 11  . collagenase (SANTYL) ointment Apply 1 application topically daily. LEFT LOWER LEG    . cyclobenzaprine (FLEXERIL) 10 MG tablet Take 10 mg by mouth at bedtime.    . diclofenac sodium (VOLTAREN) 1 % GEL Apply 2 g topically See admin instructions. Apply 2 g topically 4 times daily. Apply 2 g to left shoulder, apply 2 g to left hip and 2 g to left knee 4 times daily per [facility] instructions    . gabapentin (NEURONTIN) 100 MG capsule Take 100 mg by mouth 3 (three) times daily.    Marland Kitchen oxycodone (OXY-IR) 5 MG capsule Take 10 mg by mouth every 6 (six) hours as needed for pain.     .  pantoprazole (PROTONIX) 40 MG tablet Take 40 mg by mouth daily.     No current facility-administered medications for this visit.      Marland Kitchen  PHYSICAL EXAMINATION: ECOG  PERFORMANCE STATUS: 1 - Symptomatic but completely ambulatory  Vitals:   05/31/20 0848  BP: 136/89  Pulse: 87  Resp: 16  Temp: (!) 97 F (36.1 C)  SpO2: 99%   Filed Weights   05/31/20 0848  Weight: 181 lb (82.1 kg)    Physical Exam Constitutional:      Comments: In wheelchair. Cachectic.  Appears older than his stated age.  Alone.  HENT:     Head: Normocephalic and atraumatic.     Mouth/Throat:     Pharynx: No oropharyngeal exudate.  Eyes:     Pupils: Pupils are equal, round, and reactive to light.  Cardiovascular:     Rate and Rhythm: Normal rate and regular rhythm.  Pulmonary:     Effort: No respiratory distress.     Breath sounds: No wheezing.  Abdominal:     General: Bowel sounds are normal. There is no distension.     Palpations: Abdomen is soft. There is no mass.     Tenderness: There is no abdominal tenderness. There is no guarding or rebound.  Musculoskeletal:        General: No tenderness. Normal range of motion.     Cervical back: Normal range of motion and neck supple.  Skin:    General: Skin is warm.     Comments: Bilateral lower extremity ulcerations noted.  Pulses intact bilaterally.  Neurological:     Mental Status: He is oriented to person, place, and time.     Comments: Sleepy but easily arousable.  Chronic weakness left upper and lower extremity.  Psychiatric:        Mood and Affect: Affect normal.      LABORATORY DATA:  I have reviewed the data as listed Lab Results  Component Value Date   WBC 9.1 05/31/2020   HGB 13.1 05/31/2020   HCT 39.7 05/31/2020   MCV 81.4 05/31/2020   PLT 287 05/31/2020   Recent Labs    03/08/20 0905 03/08/20 0905 03/29/20 0824 03/29/20 0824 04/19/20 0858 05/10/20 0915 05/31/20 0821  NA 137   < > 139   < > 134* 136 136  K 4.4   < > 4.1    < > 3.8 3.7 4.1  CL 103   < > 104   < > 101 103 102  CO2 25   < > 24   < > _0 GLUCOSE 88   < > 94   < > 140* 96 99  BUN 18   < > 21   < > _1 CREATININE 0.85   < > 0.76   < > 0.92 0.92 0.98  CALCIUM 8.8*   < > 8.6*   < > 8.4* 8.4* 8.8*  GFRNONAA >60   < > >60   < > >60 >60 >60  GFRAA >60  --  >60  --  >60  --   --   PROT 8.0   < > 7.7   < > 7.8 7.6 7.8  ALBUMIN 3.8   < > 3.6   < > 3.7 3.6 3.5  AST 32   < > 28   < > 33 28 27  ALT 26   < > 20   < > _2 ALKPHOS 127*   < > 118   < > 115 103 109  BILITOT 0.6   < > 0.4   < > 0.4 0.8 0.5   < > = values in  this interval not displayed.    RADIOGRAPHIC STUDIES: I have personally reviewed the radiological images as listed and agreed with the findings in the report. No results found.  ASSESSMENT & PLAN:   Ureteral cancer, right ([place]) # High-grade urothelial cancer/cytology; likely of the right renal pelvis /upper ureter. SEP 21st 2021 C/A/P-stable; Slight ureteral thickening/small pelvic/retroperitoneal lymph nodes.  No lesions in the liver. STABLE.   # Proceed with [place] today; Labs today reviewed;  acceptable for treatment today.   # Iron deficiency anemia-hemoglobin 13.0- STABLE.   # Bilateral LE ulcers-s/p wound care evaluation; s/p PTCA with Dr.Dew. bil Arterial Dopp-wnl. STABLE.   * TG #DISPOSITION: # keytruda today;  # follow up in 4 weeks -MD labs-cbc/cmp  Keytruda-- Dr.B     All questions were answered. The patient knows to call the clinic with any problems, questions or concerns.    Cammie Sickle, MD 05/31/2020 7:10 PM

## 2020-05-31 NOTE — Assessment & Plan Note (Signed)
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 SEP 21st 2021 C/A/P-stable; Slight ureteral thickening/small pelvic/retroperitoneal lymph nodes.  No lesions in the liver. STABLE.   # Proceed with [place] today; Labs today reviewed;  acceptable for treatment today.   # Iron deficiency anemia-hemoglobin 13.0- STABLE.   # Bilateral LE ulcers-s/p wound care evaluation; s/p PTCA with Dr.Dew. bil Arterial Dopp-wnl. STABLE.   * TG #DISPOSITION: # keytruda today;  # follow up in 4 weeks -MD labs-cbc/cmp  Keytruda-- Dr.B

## 2020-05-31 NOTE — Progress Notes (Signed)
Pt tolerated infusion well. No s/s of distress or reaction noted. Pt stable at discharge.  

## 2020-06-15 ENCOUNTER — Non-Acute Institutional Stay: Payer: Medicaid Other | Admitting: Nurse Practitioner

## 2020-06-15 ENCOUNTER — Encounter: Payer: Self-pay | Admitting: Nurse Practitioner

## 2020-06-15 ENCOUNTER — Other Ambulatory Visit: Payer: Self-pay

## 2020-06-15 DIAGNOSIS — Z515 Encounter for palliative care: Secondary | ICD-10-CM

## 2020-06-15 DIAGNOSIS — C661 Malignant neoplasm of right ureter: Secondary | ICD-10-CM

## 2020-06-15 NOTE — Progress Notes (Signed)
[facility] Consult Note Telephone: [phone]  Fax: [phone]  PATIENT NAME: Kunal Levario DOB: 11-Dec-1960 MRN: [id number]  PROVIDER:Dr Hodges/[facility] [place] and PLAN: 1.ACP: DNR; treat what is treatable including hospitalizations if necessary  2.Anorexia secondary to protein calorie malnutritionrelated to cancerimprovingongoing.Continue to monitor daily weights, supplements, supportive measures and courage to eat  3.Chronic pain monitor on pain scale, monitor efficacy vs adverse side effects.Will continue to monitor; encouraged to ask when in pain, Continue to follow by psychotherapy and psychiatry for counseling; supportive services.  4.Palliative care encounter;Palliative medicine team will continue to support patient, patient's family, and medical team. Visit consisted of counseling and education dealing with the complex and emotionally intense issues of symptom management and palliative care in the setting of serious and potentially life-threatening illness  5. F/u 2 months for ongoing support, monitoring pain, weight, disease progression  I spent 55 minutes providing this consultation, starting at 12:00pm. More than 50% of the time in this consultation was spent coordinating communication.   HISTORY OF PRESENT ILLNESS:  Chad [name] is a 59 y.o. year old male with multiple medical problems including lateonset CVA, urothelial cancer, prostate cancer, chronic hepatitis C, hyperlipidemia, colon surgery, protein calorie malnutrition, anxiety, depression.  Mr. Gens continues to reside in skilled [place] at [facility]. Mr. Manrique does transfer to the wheelchair and his mobile though with increase edema in his lower extremities it does take him a little longer to propel his wheelchair. Mr Ryder does require assistance  with ADL. Mr. Debord does feed himself. Current weight is 205 lb with BMI 32. Mr. Trigueros is followed by wound care at the facility. Mr. Ferencz has continued to be followed at the [facility] with ongoing treatments. Palliative care continues to follow with visit for today ongoing monitoring of symptoms of pain, appetite in addition too supportive role and following with goals of care with ongoing chemotherapy. Staff endorses no new changes or concerns. At present Mr. Anglin is lying in bed. Mr Ernsberger appears chronically ill though comfortable. No visitors present. I visited and observed Mr. Ashby. We talked about purpose of Palliative care visit. Mr Laws in agreement. We talked about how he is feeling today. Mr Lubitz endorses that he is tired. We talked about symptoms of pain but she does continue to experience. We talked about his pain regiment. We talked about his appetite. Mr. Favorite endorses he has not had much of an appetite. Mr. Kersten does continue to go out to the smoking patio. We talked about chemotherapy and ongoing treatments. Medical goals reviewed. Mr Ciolek does remain a DNR. Discuss will follow up in two months if needed or sooner should he declined. Mr. Arvelo in agreement. Therapeutic listening and emotional support provided. Questions answered to satisfaction. I updated staff  Oncology Plan  Ureteral cancer, right [facility]) # High-grade urothelial cancer/cytology; likely of the right renal pelvis /upper ureter. SEP 21st 2021 C/A/P-stable; Slight ureteral thickening/small pelvic/retroperitoneal lymph nodes.  No lesions in the liver. STABLE.   # Proceed with [place] today; Labs today reviewed;  acceptable for treatment today.   # Iron deficiency anemia-hemoglobin 13.0- STABLE.   # Bilateral LE ulcers-s/p wound care evaluation; s/p PTCA with Dr.Dew. bil Arterial Dopp-wnl. STABLE.   * TG #DISPOSITION: # keytruda today;  # follow up in 4 weeks -MD  labs-cbc/cmp  Keytruda-- Dr.B  Palliative Care was asked to help to continue to address  goals of care.   CODE STATUS: DNR  PPS: 50% HOSPICE ELIGIBILITY/DIAGNOSIS: TBD  PAST MEDICAL HISTORY:  Past Medical History:  Diagnosis Date  . Anemia   . Anxiety   . ARF (acute respiratory failure) ([place])   . Bladder cancer ([place])   . Cancer [facility])    prostate  . COPD (chronic obstructive pulmonary disease) ([place])   . Depression   . Dysphagia   . Family history of colon cancer   . Family history of kidney cancer   . Family history of leukemia   . Family history of prostate cancer   . GERD (gastroesophageal reflux disease)   . Hepatitis    chronic hep c  . Hydronephrosis   . Hydronephrosis with ureteral stricture   . Hyperlipidemia   . Knee pain    Left  . Malignant neoplasm of colon ([place])   . Nerve pain   . Peripheral vascular disease ([place])   . Stroke ([place])   . Urinary frequency   . Venous hypertension of both lower extremities     SOCIAL HX:  Social History   Tobacco Use  . Smoking status: Current Every Day Smoker    Packs/day: 1.00    Types: Cigarettes  . Smokeless tobacco: Never Used  Substance Use Topics  . Alcohol use: Not Currently    ALLERGIES:  Allergies  Allergen Reactions  . Penicillins Rash      PHYSICAL EXAM:   General: NAD, debilitated, chronically ill, pleasant male Cardiovascular: regular rate and rhythm Pulmonary: clear ant fields Neurological: generalized weakness; w/c dependent  [place] Fleek Ihor Gully, NP

## 2020-06-19 ENCOUNTER — Other Ambulatory Visit: Payer: Self-pay

## 2020-06-19 ENCOUNTER — Encounter (INDEPENDENT_AMBULATORY_CARE_PROVIDER_SITE_OTHER): Payer: Self-pay | Admitting: Nurse Practitioner

## 2020-06-19 ENCOUNTER — Ambulatory Visit (INDEPENDENT_AMBULATORY_CARE_PROVIDER_SITE_OTHER): Payer: Medicaid Other | Admitting: Nurse Practitioner

## 2020-06-19 ENCOUNTER — Ambulatory Visit (INDEPENDENT_AMBULATORY_CARE_PROVIDER_SITE_OTHER): Payer: Medicaid Other

## 2020-06-19 VITALS — BP 117/79 | HR 88 | Ht 69.0 in | Wt 180.0 lb

## 2020-06-19 DIAGNOSIS — I739 Peripheral vascular disease, unspecified: Secondary | ICD-10-CM | POA: Diagnosis not present

## 2020-06-19 DIAGNOSIS — E43 Unspecified severe protein-calorie malnutrition: Secondary | ICD-10-CM | POA: Diagnosis not present

## 2020-06-19 DIAGNOSIS — I70239 Atherosclerosis of native arteries of right leg with ulceration of unspecified site: Secondary | ICD-10-CM

## 2020-06-19 DIAGNOSIS — L97909 Non-pressure chronic ulcer of unspecified part of unspecified lower leg with unspecified severity: Secondary | ICD-10-CM

## 2020-06-25 ENCOUNTER — Encounter (INDEPENDENT_AMBULATORY_CARE_PROVIDER_SITE_OTHER): Payer: Self-pay | Admitting: Nurse Practitioner

## 2020-06-25 NOTE — Progress Notes (Signed)
Subjective:    Patient ID: Chad [name], male    DOB: May 15, 1961, 59 y.o.   MRN: [id number] No chief complaint on file.   The patient returns to the office for followup and review of the noninvasive studies. There have been no interval changes in lower extremity symptoms. No interval shortening of the patient's claudication distance or development of rest pain symptoms. No new ulcers or wounds have occurred since the last visit.  There have been no significant changes to the patient's overall health care.  The patient denies amaurosis fugax or recent TIA symptoms. There are no recent neurological changes noted. The patient denies history of DVT, PE or superficial thrombophlebitis. The patient denies recent episodes of angina or shortness of breath.   ABI Rt=1.16 and Lt=1.14  (previous ABI's Rt=0.97 and Lt=1.27) Duplex ultrasound of the bilateral tibial arteries has biphasic waveforms with good toe waveforms bilaterally   Review of Systems  Skin: Positive for wound.  Neurological: Positive for weakness.  All other systems reviewed and are negative.      Objective:   Physical Exam Vitals reviewed.  Constitutional:      Appearance: He is ill-appearing.  HENT:     Head: Normocephalic.  Cardiovascular:     Rate and Rhythm: Normal rate.     Pulses:          Dorsalis pedis pulses are 1+ on the right side and 1+ on the left side.       Posterior tibial pulses are 1+ on the right side and 1+ on the left side.  Pulmonary:     Effort: Pulmonary effort is normal.  Neurological:     Mental Status: He is alert and oriented to person, place, and time.     Motor: Weakness present.  Psychiatric:        Mood and Affect: Mood normal.        Behavior: Behavior normal.        Thought Content: Thought content normal.        Judgment: Judgment normal.     BP 117/79   Pulse 88   Ht _0  (1.753 m)   Wt 180 lb (81.6 kg)   BMI 26.58 kg/m   Past Medical History:  Diagnosis Date  .  Anemia   . Anxiety   . ARF (acute respiratory failure) ([place])   . Bladder cancer ([place])   . Cancer [facility])    prostate  . COPD (chronic obstructive pulmonary disease) ([place])   . Depression   . Dysphagia   . Family history of colon cancer   . Family history of kidney cancer   . Family history of leukemia   . Family history of prostate cancer   . GERD (gastroesophageal reflux disease)   . Hepatitis    chronic hep c  . Hydronephrosis   . Hydronephrosis with ureteral stricture   . Hyperlipidemia   . Knee pain    Left  . Malignant neoplasm of colon ([place])   . Nerve pain   . Peripheral vascular disease ([place])   . Stroke ([place])   . Urinary frequency   . Venous hypertension of both lower extremities     Social History   Socioeconomic History  . Marital status: Single    Spouse name: Not on file  . Number of children: Not on file  . Years of education: Not on file  . Highest education level: Not on file  Occupational History  . Not on file  Tobacco Use  . Smoking status: Current Every Day Smoker    Packs/day: 1.00    Types: Cigarettes  . Smokeless tobacco: Never Used  Vaping Use  . Vaping Use: Never used  Substance and Sexual Activity  . Alcohol use: Not Currently  . Drug use: Not Currently  . Sexual activity: Not Currently  Other Topics Concern  . Not on file  Social History Narrative    used to live [place]; moved  To [place]- end of April 2019; in Nursing home; 1pp/day; quit alcohol. 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        Family- dad- prostate ca [at 77y]; brother- 58 died of prostate cancer; brother- 28- no cancers [[place]]; sonGerald Stabs [[place]];[name]-32y prostate ca [facility]: [place]]; daughter- 54 [[place]]; another daughter 21 [[place]/addict]. will refer genetics counseling. Given MSI- abnormal; highly suspicious of Lynch syndrome.  Patient's son [name] aware of high possible lynch syndrome.   Social Determinants of Health   Financial Resource Strain:   . Difficulty of Paying Living  Expenses: Not on file  Food Insecurity:   . Worried About Charity fundraiser in the Last Year: Not on file  . Ran Out of Food in the Last Year: Not on file  Transportation Needs:   . Lack of Transportation (Medical): Not on file  . Lack of Transportation (Non-Medical): Not on file  Physical Activity:   . Days of Exercise per Week: Not on file  . Minutes of Exercise per Session: Not on file  Stress:   . Feeling of Stress : Not on file  Social Connections:   . Frequency of Communication with Friends and Family: Not on file  . Frequency of Social Gatherings with Friends and Family: Not on file  . Attends Religious Services: Not on file  . Active Member of Clubs or Organizations: Not on file  . Attends Archivist Meetings: Not on file  . Marital Status: Not on file  Intimate Partner Violence:   . Fear of Current or Ex-Partner: Not on file  . Emotionally Abused: Not on file  . Physically Abused: Not on file  . Sexually Abused: Not on file    Past Surgical History:  Procedure Laterality Date  . COLON SURGERY     En bloc extended right hemicolectomy 07/2017  . CYSTOSCOPY W/ RETROGRADES Right 08/30/2018   Procedure: CYSTOSCOPY WITH RETROGRADE PYELOGRAM;  Surgeon: Hollice Espy, MD;  Location: [facility];  Service: Urology;  Laterality: Right;  . CYSTOSCOPY WITH STENT PLACEMENT Right 04/25/2018   Procedure: CYSTOSCOPY WITH STENT PLACEMENT;  Surgeon: Hollice Espy, MD;  Location: [facility];  Service: Urology;  Laterality: Right;  . CYSTOSCOPY WITH STENT PLACEMENT Right 08/30/2018   Procedure: [place] WITH STENT Exchange;  Surgeon: Hollice Espy, MD;  Location: [facility];  Service: Urology;  Laterality: Right;  . CYSTOSCOPY WITH STENT PLACEMENT Right 03/07/2019   Procedure: CYSTOSCOPY WITH STENT Exchange;  Surgeon: Hollice Espy, MD;  Location: [facility];  Service: Urology;  Laterality: Right;  . CYSTOSCOPY WITH STENT PLACEMENT Right 11/21/2019   Procedure: CYSTOSCOPY WITH STENT  Exchange;  Surgeon: Hollice Espy, MD;  Location: [facility];  Service: Urology;  Laterality: Right;  . LOWER EXTREMITY ANGIOGRAPHY Left 05/23/2019   Procedure: LOWER EXTREMITY ANGIOGRAPHY;  Surgeon: Algernon Huxley, MD;  Location: [place] [facility];  Service: Cardiovascular;  Laterality: Left;  . LOWER EXTREMITY ANGIOGRAPHY Right 05/30/2019   Procedure: LOWER EXTREMITY ANGIOGRAPHY;  Surgeon: Algernon Huxley, MD;  Location:  [place] [facility];  Service: Cardiovascular;  Laterality: Right;  . LOWER EXTREMITY ANGIOGRAPHY Right 02/13/2020   Procedure: LOWER EXTREMITY ANGIOGRAPHY;  Surgeon: Algernon Huxley, MD;  Location: [place] [facility];  Service: Cardiovascular;  Laterality: Right;  . LOWER EXTREMITY ANGIOGRAPHY Left 02/20/2020   Procedure: LOWER EXTREMITY ANGIOGRAPHY;  Surgeon: Algernon Huxley, MD;  Location: [place] [facility];  Service: Cardiovascular;  Laterality: Left;  . PORTA CATH INSERTION N/A 02/28/2019   Procedure: PORTA CATH INSERTION;  Surgeon: Algernon Huxley, MD;  Location: [place] [facility];  Service: Cardiovascular;  Laterality: N/A;  . tumor removed       Family History  Problem Relation Age of Onset  . Prostate cancer Father 90  . Cancer Brother 36       unsure type  . Cancer Paternal Uncle        unsure type  . Cancer Maternal Grandmother        unsure type  . Cancer Paternal Grandmother        unsure type  . Kidney cancer Paternal Grandfather   . Cancer Other        unsure types  . Leukemia Son   . Cancer Son        other cancers, possibly colon    Allergies  Allergen Reactions  . Penicillins Rash    CBC Latest Ref Rng & Units 05/31/2020 05/10/2020 04/19/2020  WBC 4.0 - 10.5 K/uL 9.1 7.4 7.8  Hemoglobin 13.0 - 17.0 g/dL 13.1 12.7(L) 13.5  Hematocrit 39 - 52 % 39.7 39.0 41.0  Platelets 150 - 400 K/uL 287 198 175      CMP     Component Value Date/Time   NA 136 05/31/2020 0821   K 4.1 05/31/2020 0821   CL 102 05/31/2020 0821   CO2 25 05/31/2020 0821    GLUCOSE 99 05/31/2020 0821   BUN 18 05/31/2020 0821   CREATININE 0.98 05/31/2020 0821   CALCIUM 8.8 (L) 05/31/2020 0821   PROT 7.8 05/31/2020 0821   ALBUMIN 3.5 05/31/2020 0821   AST 27 05/31/2020 0821   ALT 20 05/31/2020 0821   ALKPHOS 109 05/31/2020 0821   BILITOT 0.5 05/31/2020 0821   GFRNONAA >60 05/31/2020 0821   GFRAA >60 04/19/2020 0858     No results found.     Assessment & Plan:   1. 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  The right lower extremity wound has essentially healed at this time however the left medial ankle ulceration continues to remain large and painful.  It has been ongoing for several months.  We will refer the patient to the wound care center for further evaluation and treatment of his wound.  The patient's peripheral vascular disease is stable, no changes needed today to medication.  We will have the patient return in 6 months with noninvasive studies or sooner if issues should arise. - Ambulatory referral to Wound Clinic  2. Protein-calorie malnutrition, severe This may also be complicating the patient's wound healing.   Current Outpatient Medications on File Prior to Visit  Medication Sig Dispense Refill  . acetaminophen (TYLENOL) 325 MG tablet Take 650 mg by mouth 3 (three) times daily.     Marland Kitchen aspirin EC 81 MG tablet Take 1 tablet (81 mg total) by mouth daily. 150 tablet  2  . clopidogrel (PLAVIX) 75 MG tablet Take 1 tablet (75 mg total) by mouth daily. 30 tablet 11  . cyclobenzaprine (FLEXERIL) 10 MG tablet Take 10 mg by mouth at bedtime.    . diclofenac sodium (VOLTAREN) 1 % GEL Apply 2 g topically See admin instructions. Apply 2 g topically 4 times daily. Apply 2 g to left shoulder, apply 2 g to left hip and 2 g to left knee 4 times daily per [facility] instructions     . escitalopram (LEXAPRO) 5 MG tablet Take 5 mg by mouth daily.    . mirtazapine (REMERON) 7.5 MG tablet Take 7.5 mg by mouth at bedtime.    . pantoprazole (PROTONIX) 40 MG tablet Take 40 mg by mouth daily.    Marland Kitchen atorvastatin (LIPITOR) 10 MG tablet Take 1 tablet (10 mg total) by mouth daily. 30 tablet 11  . collagenase (SANTYL) ointment Apply 1 application topically daily. LEFT LOWER LEG (Patient not taking: Reported on 06/19/2020)    . gabapentin (NEURONTIN) 100 MG capsule Take 100 mg by mouth 3 (three) times daily. (Patient not taking: Reported on 06/19/2020)    . oxycodone (OXY-IR) 5 MG capsule Take 10 mg by mouth every 6 (six) hours as needed for pain.  (Patient not taking: Reported on 06/19/2020)     No current facility-administered medications on file prior to visit.    There are no Patient Instructions on file for this visit. No follow-ups on file.   Kris Hartmann, NP

## 2020-06-28 ENCOUNTER — Telehealth: Payer: Self-pay | Admitting: *Deleted

## 2020-06-28 ENCOUNTER — Inpatient Hospital Stay: Payer: Medicaid Other

## 2020-06-28 ENCOUNTER — Inpatient Hospital Stay: Payer: Medicaid Other | Admitting: Internal Medicine

## 2020-06-28 NOTE — Assessment & Plan Note (Deleted)
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 SEP 21st 2021 C/A/P-stable; Slight ureteral thickening/small pelvic/retroperitoneal lymph nodes.  No lesions in the liver. STABLE.   # Proceed with [place] today; Labs today reviewed;  acceptable for treatment today.   # Iron deficiency anemia-hemoglobin 13.0- STABLE.   # Bilateral LE ulcers-s/p wound care evaluation; s/p PTCA with Dr.Dew. bil Arterial Dopp-wnl. STABLE.   * TG #DISPOSITION: # keytruda today;  # follow up in 4 weeks -MD labs-cbc/cmp  Keytruda-- Dr.B

## 2020-06-28 NOTE — Telephone Encounter (Signed)
Colette in Scheduling contacted [facility]. Patient missed his infusion apts. Facility did not have patient on the schedule today for them to transport patient.  Dr.B- Please advise Colette on when to reschedule patient due limited chair availability.

## 2020-07-12 ENCOUNTER — Inpatient Hospital Stay: Payer: Medicaid Other

## 2020-07-12 ENCOUNTER — Telehealth: Payer: Self-pay | Admitting: *Deleted

## 2020-07-12 ENCOUNTER — Inpatient Hospital Stay: Payer: Medicaid Other | Admitting: Internal Medicine

## 2020-07-12 NOTE — Telephone Encounter (Signed)
Spoke with [facility]. Facility did not bring patient to his appointment. New apts given to facility for 07/12/2020

## 2020-07-13 ENCOUNTER — Inpatient Hospital Stay (HOSPITAL_BASED_OUTPATIENT_CLINIC_OR_DEPARTMENT_OTHER): Payer: Medicaid Other | Admitting: Internal Medicine

## 2020-07-13 ENCOUNTER — Inpatient Hospital Stay: Payer: Medicaid Other

## 2020-07-13 ENCOUNTER — Other Ambulatory Visit: Payer: Self-pay

## 2020-07-13 ENCOUNTER — Inpatient Hospital Stay: Payer: Medicaid Other | Attending: Internal Medicine

## 2020-07-13 VITALS — BP 104/78 | HR 91 | Temp 98.0°F | Resp 18 | Wt 165.0 lb

## 2020-07-13 DIAGNOSIS — R634 Abnormal weight loss: Secondary | ICD-10-CM | POA: Diagnosis not present

## 2020-07-13 DIAGNOSIS — R5383 Other fatigue: Secondary | ICD-10-CM | POA: Diagnosis not present

## 2020-07-13 DIAGNOSIS — Z7982 Long term (current) use of aspirin: Secondary | ICD-10-CM | POA: Diagnosis not present

## 2020-07-13 DIAGNOSIS — I10 Essential (primary) hypertension: Secondary | ICD-10-CM | POA: Diagnosis not present

## 2020-07-13 DIAGNOSIS — Z8042 Family history of malignant neoplasm of prostate: Secondary | ICD-10-CM | POA: Insufficient documentation

## 2020-07-13 DIAGNOSIS — Z9221 Personal history of antineoplastic chemotherapy: Secondary | ICD-10-CM | POA: Insufficient documentation

## 2020-07-13 DIAGNOSIS — C661 Malignant neoplasm of right ureter: Secondary | ICD-10-CM

## 2020-07-13 DIAGNOSIS — Z85038 Personal history of other malignant neoplasm of large intestine: Secondary | ICD-10-CM | POA: Insufficient documentation

## 2020-07-13 DIAGNOSIS — C651 Malignant neoplasm of right renal pelvis: Secondary | ICD-10-CM | POA: Insufficient documentation

## 2020-07-13 DIAGNOSIS — Z791 Long term (current) use of non-steroidal anti-inflammatories (NSAID): Secondary | ICD-10-CM | POA: Insufficient documentation

## 2020-07-13 DIAGNOSIS — R5381 Other malaise: Secondary | ICD-10-CM | POA: Diagnosis not present

## 2020-07-13 DIAGNOSIS — D509 Iron deficiency anemia, unspecified: Secondary | ICD-10-CM | POA: Insufficient documentation

## 2020-07-13 DIAGNOSIS — D500 Iron deficiency anemia secondary to blood loss (chronic): Secondary | ICD-10-CM

## 2020-07-13 DIAGNOSIS — Z79899 Other long term (current) drug therapy: Secondary | ICD-10-CM | POA: Insufficient documentation

## 2020-07-13 DIAGNOSIS — J449 Chronic obstructive pulmonary disease, unspecified: Secondary | ICD-10-CM | POA: Diagnosis not present

## 2020-07-13 DIAGNOSIS — Z5112 Encounter for antineoplastic immunotherapy: Secondary | ICD-10-CM | POA: Diagnosis not present

## 2020-07-13 DIAGNOSIS — Z8673 Personal history of transient ischemic attack (TIA), and cerebral infarction without residual deficits: Secondary | ICD-10-CM | POA: Insufficient documentation

## 2020-07-13 DIAGNOSIS — I739 Peripheral vascular disease, unspecified: Secondary | ICD-10-CM | POA: Diagnosis not present

## 2020-07-13 DIAGNOSIS — F17210 Nicotine dependence, cigarettes, uncomplicated: Secondary | ICD-10-CM | POA: Insufficient documentation

## 2020-07-13 DIAGNOSIS — Z515 Encounter for palliative care: Secondary | ICD-10-CM

## 2020-07-13 LAB — CBC WITH DIFFERENTIAL/PLATELET
Abs Immature Granulocytes: 0.03 10*3/uL (ref 0.00–0.07)
Basophils Absolute: 0.1 10*3/uL (ref 0.0–0.1)
Basophils Relative: 1 %
Eosinophils Absolute: 0.4 10*3/uL (ref 0.0–0.5)
Eosinophils Relative: 4 %
HCT: 38.8 % — ABNORMAL LOW (ref 39.0–52.0)
Hemoglobin: 12.4 g/dL — ABNORMAL LOW (ref 13.0–17.0)
Immature Granulocytes: 0 %
Lymphocytes Relative: 16 %
Lymphs Abs: 1.5 10*3/uL (ref 0.7–4.0)
MCH: 24.6 pg — ABNORMAL LOW (ref 26.0–34.0)
MCHC: 32 g/dL (ref 30.0–36.0)
MCV: 77 fL — ABNORMAL LOW (ref 80.0–100.0)
Monocytes Absolute: 0.7 10*3/uL (ref 0.1–1.0)
Monocytes Relative: 8 %
Neutro Abs: 6.6 10*3/uL (ref 1.7–7.7)
Neutrophils Relative %: 71 %
Platelets: 272 10*3/uL (ref 150–400)
RBC: 5.04 MIL/uL (ref 4.22–5.81)
RDW: 14.1 % (ref 11.5–15.5)
WBC: 9.3 10*3/uL (ref 4.0–10.5)
nRBC: 0 % (ref 0.0–0.2)

## 2020-07-13 LAB — COMPREHENSIVE METABOLIC PANEL WITH GFR
ALT: 16 U/L (ref 0–44)
AST: 27 U/L (ref 15–41)
Albumin: 3.5 g/dL (ref 3.5–5.0)
Alkaline Phosphatase: 95 U/L (ref 38–126)
Anion gap: 9 (ref 5–15)
BUN: 19 mg/dL (ref 8–23)
CO2: 24 mmol/L (ref 22–32)
Calcium: 8.8 mg/dL — ABNORMAL LOW (ref 8.9–10.3)
Chloride: 105 mmol/L (ref 98–111)
Creatinine, Ser: 0.85 mg/dL (ref 0.61–1.24)
GFR, Estimated: >60 mL/min
Glucose, Bld: 93 mg/dL (ref 70–99)
Potassium: 4.4 mmol/L (ref 3.5–5.1)
Sodium: 138 mmol/L (ref 135–145)
Total Bilirubin: 0.5 mg/dL (ref 0.3–1.2)
Total Protein: 8 g/dL (ref 6.5–8.1)

## 2020-07-13 MED ORDER — SODIUM CHLORIDE 0.9 % IV SOLN
200.0000 mg | Freq: Once | INTRAVENOUS | Status: AC
  Administered 2020-07-13: 10:00:00 200 mg via INTRAVENOUS
  Filled 2020-07-13: qty 8

## 2020-07-13 MED ORDER — HEPARIN SOD (PORK) LOCK FLUSH 100 UNIT/ML IV SOLN
INTRAVENOUS | Status: AC
  Filled 2020-07-13: qty 5

## 2020-07-13 MED ORDER — SODIUM CHLORIDE 0.9 % IV SOLN
Freq: Once | INTRAVENOUS | Status: AC
  Filled 2020-07-13: qty 250

## 2020-07-13 MED ORDER — SODIUM CHLORIDE 0.9% FLUSH
10.0000 mL | INTRAVENOUS | Status: DC | PRN
  Administered 2020-07-13: 08:00:00 10 mL via INTRAVENOUS
  Filled 2020-07-13: qty 10

## 2020-07-13 MED ORDER — IRON SUCROSE 20 MG/ML IV SOLN: 200.0000 mg | Freq: Once | INTRAVENOUS | Status: DC

## 2020-07-13 MED ORDER — HEPARIN SOD (PORK) LOCK FLUSH 100 UNIT/ML IV SOLN
500.0000 [IU] | Freq: Once | INTRAVENOUS | Status: AC
  Administered 2020-07-13: 10:00:00 500 [IU] via INTRAVENOUS
  Filled 2020-07-13: qty 5

## 2020-07-13 NOTE — Progress Notes (Signed)
Pt tolerated infusion well with no complications. VSS. Pt stable for discharge. Transportation called.   Chizuko Trine [employer]

## 2020-07-13 NOTE — Progress Notes (Signed)
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; # Colon ca- stage III  GOALS: palliative  CURRENT/MOST RECENT THERAPY: Keytruda [C]    Urothelial cancer ([place])   Initial Diagnosis   Urothelial cancer ([place])   Ureteral cancer, right ([place])  05/26/2018 Initial Diagnosis   Ureteral cancer, right ([place])   06/21/2018 -  Chemotherapy   The patient had pembrolizumab (KEYTRUDA) 200 mg in sodium chloride 0.9 % 50 mL chemo infusion, 200 mg, Intravenous, Once, 29 of 32 cycles Administration: 200 mg (06/21/2018), 200 mg (07/12/2018), 200 mg (08/02/2018), 200 mg (08/23/2018), 200 mg (09/13/2018), 200 mg (10/04/2018), 200 mg (10/25/2018), 200 mg (11/22/2018), 200 mg (12/13/2018), 200 mg (01/05/2019), 200 mg (01/24/2019), 200 mg (02/14/2019), 200 mg (03/11/2019), 200 mg (06/17/2019), 200 mg (08/26/2019), 200 mg (09/16/2019), 200 mg (10/07/2019), 200 mg (10/28/2019), 200 mg (11/24/2019), 200 mg (12/15/2019), 200 mg (01/05/2020), 200 mg (01/26/2020), 200 mg (02/16/2020), 200 mg (03/08/2020), 200 mg (03/29/2020), 200 mg (04/19/2020), 200 mg (05/10/2020), 200 mg (05/31/2020), 200 mg (07/13/2020)  for chemotherapy treatment.       HISTORY OF PRESENTING ILLNESS: Patient is a poor historian.  Is alone.  Chad [name] 59 y.o.  male above history of stage IV-ureteral cancer/with multiple co-morbidities currently on Keytruda is here for follow-up.  Missed appts sec to transportation  issues.  Patient denies any nausea vomiting.  Denies abdominal pain or diarrhea.  No blood in stools.  No blood in urine.   Review of Systems  Constitutional: Positive for malaise/fatigue. Negative for chills, diaphoresis, fever and weight loss.  HENT: Negative for nosebleeds and sore throat.   Eyes: Negative for double vision.  Respiratory: Positive for cough and shortness of breath. Negative for hemoptysis and wheezing.   Cardiovascular: Negative for chest pain, palpitations and orthopnea.  Gastrointestinal: Negative for abdominal pain, blood in stool, constipation, diarrhea, heartburn, melena, nausea and vomiting.  Genitourinary: Negative for dysuria, frequency and urgency.  Musculoskeletal: Positive for back pain and joint pain.  Skin: Negative.  Negative for itching and rash.  Neurological: Positive for focal weakness. Negative for dizziness, tingling, weakness and headaches.       Chronic left-sided weakness upper than lower extremity.  Endo/Heme/Allergies: Does not bruise/bleed easily.  Psychiatric/Behavioral: Negative for depression. The patient is not nervous/anxious and does not have insomnia.      MEDICAL HISTORY:  Past Medical History:  Diagnosis Date  . Anemia   . Anxiety   . ARF (acute respiratory failure) ([place])   . Bladder cancer ([place])   . Cancer [facility])    prostate  . COPD (chronic obstructive pulmonary disease) ([place])   . Depression   . Dysphagia   . Family history of colon cancer   . Family history of kidney cancer   . Family history of leukemia   . Family history of prostate cancer   . GERD (gastroesophageal reflux disease)   . Hepatitis    chronic hep c  . Hydronephrosis   .  Hydronephrosis with ureteral stricture   . Hyperlipidemia   . Knee pain    Left  . Malignant neoplasm of colon ([place])   . Nerve pain   . Peripheral vascular disease ([place])   . Stroke ([place])   . Urinary frequency   . Venous hypertension of both lower extremities     SURGICAL  HISTORY: Past Surgical History:  Procedure Laterality Date  . COLON SURGERY     En bloc extended right hemicolectomy 07/2017  . CYSTOSCOPY W/ RETROGRADES Right 08/30/2018   Procedure: CYSTOSCOPY WITH RETROGRADE PYELOGRAM;  Surgeon: Hollice Espy, MD;  Location: [facility];  Service: Urology;  Laterality: Right;  . CYSTOSCOPY WITH STENT PLACEMENT Right 04/25/2018   Procedure: CYSTOSCOPY WITH STENT PLACEMENT;  Surgeon: Hollice Espy, MD;  Location: [facility];  Service: Urology;  Laterality: Right;  . CYSTOSCOPY WITH STENT PLACEMENT Right 08/30/2018   Procedure: Chloride WITH STENT Exchange;  Surgeon: Hollice Espy, MD;  Location: [facility];  Service: Urology;  Laterality: Right;  . CYSTOSCOPY WITH STENT PLACEMENT Right 03/07/2019   Procedure: CYSTOSCOPY WITH STENT Exchange;  Surgeon: Hollice Espy, MD;  Location: [facility];  Service: Urology;  Laterality: Right;  . CYSTOSCOPY WITH STENT PLACEMENT Right 11/21/2019   Procedure: CYSTOSCOPY WITH STENT Exchange;  Surgeon: Hollice Espy, MD;  Location: [facility];  Service: Urology;  Laterality: Right;  . LOWER EXTREMITY ANGIOGRAPHY Left 05/23/2019   Procedure: LOWER EXTREMITY ANGIOGRAPHY;  Surgeon: Algernon Huxley, MD;  Location: [place] [facility];  Service: Cardiovascular;  Laterality: Left;  . LOWER EXTREMITY ANGIOGRAPHY Right 05/30/2019   Procedure: LOWER EXTREMITY ANGIOGRAPHY;  Surgeon: Algernon Huxley, MD;  Location: [place] [facility];  Service: Cardiovascular;  Laterality: Right;  . LOWER EXTREMITY ANGIOGRAPHY Right 02/13/2020   Procedure: LOWER EXTREMITY ANGIOGRAPHY;  Surgeon: Algernon Huxley, MD;  Location: [place] [facility];  Service: Cardiovascular;  Laterality: Right;  . LOWER EXTREMITY ANGIOGRAPHY Left 02/20/2020   Procedure: LOWER EXTREMITY ANGIOGRAPHY;  Surgeon: Algernon Huxley, MD;  Location: [place] [facility];  Service: Cardiovascular;  Laterality: Left;  . PORTA CATH INSERTION N/A 02/28/2019   Procedure: PORTA CATH INSERTION;  Surgeon: Algernon Huxley, MD;  Location: [place] [facility];  Service: Cardiovascular;  Laterality: N/A;  . tumor removed       SOCIAL HISTORY: Social History   Socioeconomic History  . Marital status: Single    Spouse name: Not on file  . Number of children: Not on file  . Years of education: Not on file  . Highest education level: Not on file  Occupational History  . Not on file  Tobacco Use  . Smoking status: Current Every Day Smoker    Packs/day: 1.00    Types: Cigarettes  . Smokeless tobacco: Never Used  Vaping Use  . Vaping Use: Never used  Substance and Sexual Activity  . Alcohol use: Not Currently  . Drug use: Not Currently  . Sexual activity: Not Currently  Other Topics Concern  . Not on file  Social History Narrative    used to live [place]; moved  To [employer]- end of April 2019; in Nursing home; 1pp/day; quit alcohol. 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        Family- dad- prostate ca [at 11y]; brother- 60 died of prostate cancer; brother- 47- no cancers [[place]]; sonGerald Stabs [[place]];[name]-32y prostate ca [facility] [place]]; daughter- 14 [[place]]; another daughter 45 [[place]/addict]. will refer genetics counseling. Given MSI- abnormal; highly suspicious of  Lynch syndrome.  Patient's son [name] aware of high possible lynch syndrome.   Social Determinants of Health   Financial Resource Strain: Not on file  Food Insecurity: Not on file  Transportation Needs: Not on file  Physical Activity: Not on file  Stress: Not on file  Social Connections: Not on file  Intimate Partner Violence: Not on file    FAMILY HISTORY: Family History  Problem Relation Age of Onset  . Prostate cancer Father 49  . Cancer Brother 23       unsure type  . Cancer Paternal Uncle        unsure type  . Cancer Maternal Grandmother        unsure type  . Cancer Paternal Grandmother        unsure type  . Kidney cancer Paternal Grandfather   . Cancer Other        unsure types  . Leukemia Son   . Cancer Son        other  cancers, possibly colon    ALLERGIES:  is allergic to penicillins.  MEDICATIONS:  Current Outpatient Medications  Medication Sig Dispense Refill  . acetaminophen (TYLENOL) 325 MG tablet Take 650 mg by mouth 3 (three) times daily.     Marland Kitchen aspirin EC 81 MG tablet Take 1 tablet (81 mg total) by mouth daily. 150 tablet 2  . atorvastatin (LIPITOR) 10 MG tablet Take 1 tablet (10 mg total) by mouth daily. 30 tablet 11  . clopidogrel (PLAVIX) 75 MG tablet Take 1 tablet (75 mg total) by mouth daily. 30 tablet 11  . cyclobenzaprine (FLEXERIL) 10 MG tablet Take 10 mg by mouth at bedtime.    . diclofenac sodium (VOLTAREN) 1 % GEL Apply 2 g topically See admin instructions. Apply 2 g topically 4 times daily. Apply 2 g to left shoulder, apply 2 g to left hip and 2 g to left knee 4 times daily per [facility] instructions    . escitalopram (LEXAPRO) 5 MG tablet Take 5 mg by mouth daily.    Marland Kitchen gabapentin (NEURONTIN) 100 MG capsule Take 100 mg by mouth 3 (three) times daily.    . mirtazapine (REMERON) 7.5 MG tablet Take 7.5 mg by mouth at bedtime.    Marland Kitchen oxycodone (OXY-IR) 5 MG capsule Take 10 mg by mouth every 6 (six) hours as needed for pain.    . pantoprazole (PROTONIX) 40 MG tablet Take 40 mg by mouth daily.    . collagenase (SANTYL) ointment Apply 1 application topically daily. LEFT LOWER LEG (Patient not taking: No sig reported)     No current facility-administered medications for this visit.      Marland Kitchen  PHYSICAL EXAMINATION: ECOG PERFORMANCE STATUS: 1 - Symptomatic but completely ambulatory  Vitals:   07/13/20 0844  BP: 104/78  Pulse: 91  Resp: 18  Temp: 98 F (36.7 C)  SpO2: 95%   Filed Weights   07/13/20 0844  Weight: 165 lb (74.8 kg)    Physical Exam Constitutional:      Comments: In wheelchair. Cachectic.  Appears older than his stated age.  Alone.  HENT:     Head: Normocephalic and atraumatic.     Mouth/Throat:     Pharynx: No oropharyngeal exudate.  Eyes:     Pupils: Pupils are equal,  round, and reactive to light.  Cardiovascular:     Rate and Rhythm: Normal rate and regular rhythm.  Pulmonary:     Effort: No respiratory distress.  Breath sounds: No wheezing.  Abdominal:     General: Bowel sounds are normal. There is no distension.     Palpations: Abdomen is soft. There is no mass.     Tenderness: There is no abdominal tenderness. There is no guarding or rebound.  Musculoskeletal:        General: No tenderness. Normal range of motion.     Cervical back: Normal range of motion and neck supple.  Skin:    General: Skin is warm.     Comments: Bilateral lower extremity ulcerations noted.  Pulses intact bilaterally.  Neurological:     Mental Status: He is oriented to person, place, and time.     Comments: Sleepy but easily arousable.  Chronic weakness left upper and lower extremity.  Psychiatric:        Mood and Affect: Affect normal.      LABORATORY DATA:  I have reviewed the data as listed Lab Results  Component Value Date   WBC 9.3 07/13/2020   HGB 12.4 (L) 07/13/2020   HCT 38.8 (L) 07/13/2020   MCV 77.0 (L) 07/13/2020   PLT 272 07/13/2020   Recent Labs    03/08/20 0905 03/29/20 0824 04/19/20 0858 05/10/20 0915 05/31/20 0821 07/13/20 0811  NA 137 139 134* 136 136 138  K 4.4 4.1 3.8 3.7 4.1 4.4  CL 103 104 101 103 102 105  CO2 '25 24 26 25 25 24  ' GLUCOSE 88 94 140* 96 99 93  BUN '18 21 19 20 18 19  ' CREATININE 0.85 0.76 0.92 0.92 0.98 0.85  CALCIUM 8.8* 8.6* 8.4* 8.4* 8.8* 8.8*  GFRNONAA >60 >60 >60 >60 >60 >60  GFRAA >60 >60 >60  --   --   --   PROT 8.0 7.7 7.8 7.6 7.8 8.0  ALBUMIN 3.8 3.6 3.7 3.6 3.5 3.5  AST 32 28 33 '28 27 27  ' ALT '26 20 24 18 20 16  ' ALKPHOS 127* 118 115 103 109 95  BILITOT 0.6 0.4 0.4 0.8 0.5 0.5    RADIOGRAPHIC STUDIES: I have personally reviewed the radiological images as listed and agreed with the findings in the report. VAS [place] ABI WITH/WO TBI  Result Date: 06/26/2020 LOWER EXTREMITY DOPPLER STUDY Indications:  Peripheral artery disease.  Vascular Interventions: 05/23/2019 PTA left PTA.                         05/30/2019 PTA right distal PTA.                         02/13/2020 PTA of right PTA.                         02/20/2020 Catheter placement of SFA. Comparison Study: 03/13/2020 Performing Technologist: Concha Norway RVT  Examination Guidelines: A complete evaluation includes at minimum, Doppler waveform signals and systolic blood pressure reading at the level of bilateral brachial, anterior tibial, and posterior tibial arteries, when vessel segments are accessible. Bilateral testing is considered an integral part of a complete examination. Photoelectric Plethysmograph (PPG) waveforms and toe systolic pressure readings are included as required and additional duplex testing as needed. Limited examinations for reoccurring indications may be performed as noted.  ABI Findings: +---------+------------------+-----+--------+--------+ Right    Rt Pressure (mmHg)IndexWaveformComment  +---------+------------------+-----+--------+--------+ Brachial 106                                     +---------+------------------+-----+--------+--------+  ATA      116               1.09 biphasic         +---------+------------------+-----+--------+--------+ PTA      123               1.16 biphasic         +---------+------------------+-----+--------+--------+ Great Toe55                0.52 Abnormal         +---------+------------------+-----+--------+--------+ +---------+------------------+-----+--------+-------+ Left     Lt Pressure (mmHg)IndexWaveformComment +---------+------------------+-----+--------+-------+ ATA      118               1.11 biphasic        +---------+------------------+-----+--------+-------+ PTA      121               1.14 biphasic        +---------+------------------+-----+--------+-------+ Great Toe86                0.81 Normal           +---------+------------------+-----+--------+-------+ +-------+-----------+-----------+------------+------------+ ABI/TBIToday's ABIToday's TBIPrevious ABIPrevious TBI +-------+-----------+-----------+------------+------------+ Right  1.16       .52        .97         .42          +-------+-----------+-----------+------------+------------+ Left   1.14       .81        1.27        .77          +-------+-----------+-----------+------------+------------+ Bilateral ABIs and TBIs appear essentially unchanged compared to prior study on 03/13/2020.  Summary: Right: Resting right ankle-brachial index is within normal range. No evidence of significant right lower extremity arterial disease. The right toe-brachial index is abnormal. Left: Resting left ankle-brachial index is within normal range. No evidence of significant left lower extremity arterial disease. The left toe-brachial index is normal.  *See table(s) above for measurements and observations.  Electronically signed by Leotis Pain MD on 06/26/2020 at 9:36:03 AM.    Final     ASSESSMENT & PLAN:   Ureteral cancer, right ([place]) # High-grade urothelial cancer/cytology; likely of the right renal pelvis /upper ureter. SEP 21st 2021 C/A/P-stable; Slight ureteral thickening/small pelvic/retroperitoneal lymph nodes.  No lesions in the liver. STABLE.   # Proceed with [place] today; Labs today reviewed;  acceptable for treatment today.   # Iron deficiency anemia-hemoglobin 12.0-STABLE.   # Bilateral LE ulcers-s/p wound care evaluation; s/p PTCA with Dr.Dew. bil Arterial Dopp-wnl. STABLE.   # weight loss- ? Fluid vs. Diet at [facility]. await above imaging.   #DISPOSITION: [name] today;  # follow up in 10th Jan -MD labs-cbc/cmp; Leonia Reeves;  Andrey Cota CA/P prior; Dr.B     All questions were answered. The patient knows to call the clinic with any problems, questions or concerns.    Cammie Sickle, MD 07/15/2020 8:11 PM

## 2020-07-13 NOTE — Assessment & Plan Note (Signed)
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 SEP 21st 2021 C/A/P-stable; Slight ureteral thickening/small pelvic/retroperitoneal lymph nodes.  No lesions in the liver. STABLE.   # Proceed with [place] today; Labs today reviewed;  acceptable for treatment today.   # Iron deficiency anemia-hemoglobin 12.0-STABLE.   # Bilateral LE ulcers-s/p wound care evaluation; s/p PTCA with Dr.Dew. bil Arterial Dopp-wnl. STABLE.   # weight loss- ? Fluid vs. Diet at [facility]. await above imaging.   #DISPOSITION: Chad [name] today;  # follow up in 10th Jan -MD labs-cbc/cmp; Dalbert Batman CA/P prior; Dr.B

## 2020-07-19 ENCOUNTER — Non-Acute Institutional Stay: Payer: Medicaid Other | Admitting: Nurse Practitioner

## 2020-07-19 ENCOUNTER — Encounter: Payer: Self-pay | Admitting: Nurse Practitioner

## 2020-07-19 DIAGNOSIS — Z515 Encounter for palliative care: Secondary | ICD-10-CM

## 2020-07-19 DIAGNOSIS — C661 Malignant neoplasm of right ureter: Secondary | ICD-10-CM

## 2020-07-19 NOTE — Progress Notes (Signed)
Therapist, nutritional Palliative Care Consult Note Telephone: [phone]  Fax: [phone]  PATIENT NAME: Chad [name] DOB: 05-03-1961 MRN: [id number]  PRIMARY CARE PROVIDER:  [facility] RESPONSIBLE PARTY:Self  RECOMMENDATIONS and PLAN: 1.ACP: DNR; treat what is treatable including hospitalizations if necessary  2.Anorexia secondary to protein calorie malnutritionrelated to cancerimprovingongoing.Continue to monitor daily weights, supplements, supportive measures and courage to eat  3.Chronic pain monitor on pain scale, monitor efficacy vs adverse side effects.Will continue to monitor; encouraged to ask when in pain, Continue to follow by psychotherapy and psychiatry for counseling; supportive services.  4.Palliative care encounter;Palliative medicine team will continue to support patient, patient's family, and medical team. Visit consisted of counseling and education dealing with the complex and emotionally intense issues of symptom management and palliative care in the setting of serious and potentially life-threatening illness  5. F/u 2 months for ongoing support, monitoring pain, weight, disease progression   I spent 40 minutes providing this consultation,  Starting at 11:30am. More than 50% of the time in this consultation was spent coordinating communication.   HISTORY OF PRESENT ILLNESS:  Chad [name] is a 59 y.o. year old male with multiple medical problems including lateonset CVA, urothelial cancer, prostate cancer, chronic hepatitis C, hyperlipidemia, colon surgery, protein calorie malnutrition, anxiety, depression. Chad. [name] continues to reside in [facility]illed [facility]term care nursing facility and [facility]. Chad. [name] does transferred to the wheelchair where Chad. [name] is mobile. Chad. [name] does requires distance for adl's including bathing, dressing. Chad. [name] continues to have wounds lower  extremities for which wound care does manage at the facility. Chad [name] does feed himself with a decreased appetite. Current weight 160.0 lb with weight loss of 15.7 lb with BMI 25.1. Chad [name] continues to be on a regular diet regular texture regular liquid consistency. Chad [name] continues to be followed by wound at the facility for lower extremity wounds. No recent hospitalizations, infections, falls. Chad. [name] has continued to be followed by Psychiatry with last data service 11/18 / 2021 for depression and history of mood disorder prescribed escitalopram and Remeron. No changes at this visit. Staff endorses no new concerns or complaints. At present Chad [name] is sitting in the wheelchair in his room. Chad [name] appears chronically ill, comfortable. Chad. [name] and I talked about purpose of Palliative care visit. Chad [name] in agreement. We talked about how he is feeling. Chad [name] endorses that he is doing okay. Chad. [name] visits at the [facility] or going well. We talked about symptoms of pain which he intimately has though currently comfortable. Chad [name] denies shortness of breath. We talked about his appetite at length including nutrition. We talked about weight loss. We talked about smoking as he does continue to go out to smoke. Chad. [name] decline smoking cessation. Chad [name] endorses that the one thing that he does enjoy. We talked about the challenges with Skilled facility, barriers and coping strategies. We talked about his son who is supposed to come visit him tomorrow. We talked about his plans for Christmas. We talked about role Palliative care and plan of care. We talked about follow-up visit and 2 months if needed or sooner should he declined. Chad [name] in agreement. I updated staff no new changes to current goals or plan of care at present time Palliative Care was asked to help to continue to address goals of care.   CODE STATUS: DNR  PPS: 50% HOSPICE  ELIGIBILITY/DIAGNOSIS: TBD  PAST MEDICAL  HISTORY:  Past Medical History:  Diagnosis Date  . Anemia   . Anxiety   . ARF (acute respiratory failure) ([place])   . Bladder cancer ([place])   . Cancer [facility])    prostate  . COPD (chronic obstructive pulmonary disease) ([place])   . Depression   . Dysphagia   . Family history of colon cancer   . Family history of kidney cancer   . Family history of leukemia   . Family history of prostate cancer   . GERD (gastroesophageal reflux disease)   . Hepatitis    chronic hep c  . Hydronephrosis   . Hydronephrosis with ureteral stricture   . Hyperlipidemia   . Knee pain    Left  . Malignant neoplasm of colon ([place])   . Nerve pain   . Peripheral vascular disease ([place])   . Stroke ([place])   . Urinary frequency   . Venous hypertension of both lower extremities     SOCIAL HX:  Social History   Tobacco Use  . Smoking status: Current Every Day Smoker    Packs/day: 1.00    Types: Cigarettes  . Smokeless tobacco: Never Used  Substance Use Topics  . Alcohol use: Not Currently    ALLERGIES:  Allergies  Allergen Reactions  . Penicillins Rash     PHYSICAL EXAM:   General: NAD, chronically ill, w/c dependent pleasant male Cardiovascular: regular rate and rhythm Pulmonary: clear ant fields Extremities: +BLE edema, no joint deformities Neurological: w/c dependent  Tayte Mcwherter Ihor Gully, NP

## 2020-07-31 ENCOUNTER — Telehealth: Payer: Self-pay | Admitting: *Deleted

## 2020-07-31 ENCOUNTER — Ambulatory Visit: Admission: RE | Admit: 2020-07-31 | Payer: Medicaid Other | Source: Ambulatory Visit

## 2020-07-31 NOTE — Telephone Encounter (Signed)
Chad [name] from [facility] [place], [place]. Chad [name] stated that patient refused his ct scan. Nurse could not give me a reason to why patient refused scan.  So, I asked to speak to the patient directly to ask open-ended questions to determine why patient was "refusing his scan." pt stated that his facility brought him food this morning and he ate it. "no one told me that I had a scan and they still brought me a food tray. I haven't drank my contrast. They brought that too me too but it's still sitting here on my tray. Since I just ate, I have to be NPO 4 hours. So, I need to r/s my scan."  I spoke with Rosann Auerbach, RN. She states to r/s patient's ct scan if possible. Colette, patient wanted to know if this could be r/s for tomorrow. Please r/s scan and contact Tish, RN patient's nurse with new apts.

## 2020-07-31 NOTE — Telephone Encounter (Signed)
Per Colette- patient's imaging was r/s to 08/01/20

## 2020-08-01 ENCOUNTER — Other Ambulatory Visit: Payer: Self-pay

## 2020-08-01 ENCOUNTER — Ambulatory Visit
Admission: RE | Admit: 2020-08-01 | Discharge: 2020-08-01 | Disposition: A | Discharge status: Home / Self Care | Payer: Medicaid Other | Source: Ambulatory Visit | Attending: Internal Medicine | Admitting: Internal Medicine

## 2020-08-01 DIAGNOSIS — C661 Malignant neoplasm of right ureter: Secondary | ICD-10-CM | POA: Diagnosis not present

## 2020-08-01 HISTORY — DX: Malignant neoplasm of prostate: C61

## 2020-08-01 MED ORDER — IOHEXOL 300 MG/ML  SOLN
100.0000 mL | Freq: Once | INTRAMUSCULAR | Status: AC | PRN
  Administered 2020-08-01: 100 mL via INTRAVENOUS

## 2020-08-06 ENCOUNTER — Inpatient Hospital Stay: Payer: Medicaid Other

## 2020-08-06 ENCOUNTER — Inpatient Hospital Stay: Payer: Medicaid Other | Attending: Internal Medicine

## 2020-08-06 ENCOUNTER — Other Ambulatory Visit: Payer: Self-pay

## 2020-08-06 ENCOUNTER — Inpatient Hospital Stay (HOSPITAL_BASED_OUTPATIENT_CLINIC_OR_DEPARTMENT_OTHER): Payer: Medicaid Other | Admitting: Internal Medicine

## 2020-08-06 ENCOUNTER — Encounter: Payer: Self-pay | Admitting: Internal Medicine

## 2020-08-06 DIAGNOSIS — D509 Iron deficiency anemia, unspecified: Secondary | ICD-10-CM | POA: Insufficient documentation

## 2020-08-06 DIAGNOSIS — E785 Hyperlipidemia, unspecified: Secondary | ICD-10-CM | POA: Insufficient documentation

## 2020-08-06 DIAGNOSIS — C661 Malignant neoplasm of right ureter: Secondary | ICD-10-CM | POA: Diagnosis not present

## 2020-08-06 DIAGNOSIS — Z515 Encounter for palliative care: Secondary | ICD-10-CM

## 2020-08-06 DIAGNOSIS — Z8042 Family history of malignant neoplasm of prostate: Secondary | ICD-10-CM | POA: Insufficient documentation

## 2020-08-06 DIAGNOSIS — K219 Gastro-esophageal reflux disease without esophagitis: Secondary | ICD-10-CM | POA: Diagnosis not present

## 2020-08-06 DIAGNOSIS — Z5112 Encounter for antineoplastic immunotherapy: Secondary | ICD-10-CM | POA: Insufficient documentation

## 2020-08-06 DIAGNOSIS — B182 Chronic viral hepatitis C: Secondary | ICD-10-CM | POA: Diagnosis not present

## 2020-08-06 DIAGNOSIS — J449 Chronic obstructive pulmonary disease, unspecified: Secondary | ICD-10-CM | POA: Insufficient documentation

## 2020-08-06 DIAGNOSIS — Z800 Family history of malignant neoplasm of digestive organs: Secondary | ICD-10-CM | POA: Insufficient documentation

## 2020-08-06 DIAGNOSIS — Z5189 Encounter for other specified aftercare: Secondary | ICD-10-CM | POA: Diagnosis not present

## 2020-08-06 DIAGNOSIS — Z8673 Personal history of transient ischemic attack (TIA), and cerebral infarction without residual deficits: Secondary | ICD-10-CM | POA: Insufficient documentation

## 2020-08-06 DIAGNOSIS — Z7982 Long term (current) use of aspirin: Secondary | ICD-10-CM | POA: Insufficient documentation

## 2020-08-06 DIAGNOSIS — F419 Anxiety disorder, unspecified: Secondary | ICD-10-CM | POA: Insufficient documentation

## 2020-08-06 DIAGNOSIS — I739 Peripheral vascular disease, unspecified: Secondary | ICD-10-CM | POA: Insufficient documentation

## 2020-08-06 DIAGNOSIS — K5900 Constipation, unspecified: Secondary | ICD-10-CM | POA: Insufficient documentation

## 2020-08-06 DIAGNOSIS — F17210 Nicotine dependence, cigarettes, uncomplicated: Secondary | ICD-10-CM | POA: Insufficient documentation

## 2020-08-06 DIAGNOSIS — R5381 Other malaise: Secondary | ICD-10-CM | POA: Insufficient documentation

## 2020-08-06 DIAGNOSIS — Z8546 Personal history of malignant neoplasm of prostate: Secondary | ICD-10-CM | POA: Insufficient documentation

## 2020-08-06 DIAGNOSIS — F32A Depression, unspecified: Secondary | ICD-10-CM | POA: Insufficient documentation

## 2020-08-06 DIAGNOSIS — Z9221 Personal history of antineoplastic chemotherapy: Secondary | ICD-10-CM | POA: Diagnosis not present

## 2020-08-06 DIAGNOSIS — Z85038 Personal history of other malignant neoplasm of large intestine: Secondary | ICD-10-CM | POA: Insufficient documentation

## 2020-08-06 DIAGNOSIS — Z791 Long term (current) use of non-steroidal anti-inflammatories (NSAID): Secondary | ICD-10-CM | POA: Diagnosis not present

## 2020-08-06 DIAGNOSIS — Z79899 Other long term (current) drug therapy: Secondary | ICD-10-CM | POA: Diagnosis not present

## 2020-08-06 DIAGNOSIS — R5383 Other fatigue: Secondary | ICD-10-CM | POA: Diagnosis not present

## 2020-08-06 LAB — CBC WITH DIFFERENTIAL/PLATELET
Abs Immature Granulocytes: 0.03 10*3/uL (ref 0.00–0.07)
Basophils Absolute: 0.1 10*3/uL (ref 0.0–0.1)
Basophils Relative: 1 %
Eosinophils Absolute: 0.5 10*3/uL (ref 0.0–0.5)
Eosinophils Relative: 6 %
HCT: 39.2 % (ref 39.0–52.0)
Hemoglobin: 12.2 g/dL — ABNORMAL LOW (ref 13.0–17.0)
Immature Granulocytes: 0 %
Lymphocytes Relative: 19 %
Lymphs Abs: 1.8 10*3/uL (ref 0.7–4.0)
MCH: 23.6 pg — ABNORMAL LOW (ref 26.0–34.0)
MCHC: 31.1 g/dL (ref 30.0–36.0)
MCV: 75.8 fL — ABNORMAL LOW (ref 80.0–100.0)
Monocytes Absolute: 0.7 10*3/uL (ref 0.1–1.0)
Monocytes Relative: 7 %
Neutro Abs: 6 10*3/uL (ref 1.7–7.7)
Neutrophils Relative %: 67 %
Platelets: 255 10*3/uL (ref 150–400)
RBC: 5.17 MIL/uL (ref 4.22–5.81)
RDW: 15.1 % (ref 11.5–15.5)
WBC: 9.1 10*3/uL (ref 4.0–10.5)
nRBC: 0 % (ref 0.0–0.2)

## 2020-08-06 LAB — COMPREHENSIVE METABOLIC PANEL
ALT: 18 U/L (ref 0–44)
AST: 32 U/L (ref 15–41)
Albumin: 3.4 g/dL — ABNORMAL LOW (ref 3.5–5.0)
Alkaline Phosphatase: 99 U/L (ref 38–126)
Anion gap: 12 (ref 5–15)
BUN: 19 mg/dL (ref 8–23)
CO2: 21 mmol/L — ABNORMAL LOW (ref 22–32)
Calcium: 8.6 mg/dL — ABNORMAL LOW (ref 8.9–10.3)
Chloride: 103 mmol/L (ref 98–111)
Creatinine, Ser: 0.91 mg/dL (ref 0.61–1.24)
GFR, Estimated: >60 mL/min (ref >60)
Glucose, Bld: 92 mg/dL (ref 70–99)
Potassium: 4.5 mmol/L (ref 3.5–5.1)
Sodium: 136 mmol/L (ref 135–145)
Total Bilirubin: 0.6 mg/dL (ref 0.3–1.2)
Total Protein: 8 g/dL (ref 6.5–8.1)

## 2020-08-06 LAB — TSH: TSH: 2.727 u[IU]/mL (ref 0.350–4.500)

## 2020-08-06 MED ORDER — HEPARIN SOD (PORK) LOCK FLUSH 100 UNIT/ML IV SOLN
INTRAVENOUS | Status: AC
  Filled 2020-08-06: qty 5

## 2020-08-06 MED ORDER — SODIUM CHLORIDE 0.9 % IV SOLN
Freq: Once | INTRAVENOUS | Status: AC
  Filled 2020-08-06: qty 250

## 2020-08-06 MED ORDER — SODIUM CHLORIDE 0.9% FLUSH
10.0000 mL | INTRAVENOUS | Status: DC | PRN
  Administered 2020-08-06: 10 mL via INTRAVENOUS
  Filled 2020-08-06: qty 10

## 2020-08-06 MED ORDER — HEPARIN SOD (PORK) LOCK FLUSH 100 UNIT/ML IV SOLN
500.0000 [IU] | Freq: Once | INTRAVENOUS | Status: AC
  Administered 2020-08-06: 500 [IU] via INTRAVENOUS
  Filled 2020-08-06: qty 5

## 2020-08-06 MED ORDER — SODIUM CHLORIDE 0.9 % IV SOLN
200.0000 mg | Freq: Once | INTRAVENOUS | Status: AC
  Administered 2020-08-06: 200 mg via INTRAVENOUS
  Filled 2020-08-06: qty 8

## 2020-08-06 NOTE — Progress Notes (Signed)
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  Is alone.  Chad [name] 60 y.o.  male above history of stage IV-ureteral cancer/with multiple co-morbidities currently on Keytruda is here for follow-up/review results of the CT scan.  Patient unfortunate has missed appointments because of transportation issues.  He complains of intermittent constipation.  Denies any blood in stools or black-colored stools.  Denies abdominal distention.  No nausea vomiting.  Has not had any recent colonoscopy; last colonoscopy/right hemicolectomy colon surgery in 2019 in [place].   Review of Systems  Constitutional: Positive for malaise/fatigue. Negative for chills, diaphoresis, fever and weight loss.  HENT: Negative for nosebleeds and sore throat.   Eyes: Negative for double vision.  Respiratory: Positive for cough and shortness of breath. Negative for hemoptysis and wheezing.   Cardiovascular: Negative for chest pain,  palpitations and orthopnea.  Gastrointestinal: Positive for constipation. Negative for abdominal pain, blood in stool, diarrhea, heartburn, melena, nausea and vomiting.  Genitourinary: Negative for dysuria, frequency and urgency.  Musculoskeletal: Positive for back pain and joint pain.  Skin: Negative.  Negative for itching and rash.  Neurological: Positive for focal weakness. Negative for dizziness, tingling, weakness and headaches.       Chronic left-sided weakness upper than lower extremity.  Endo/Heme/Allergies: Does not bruise/bleed easily.  Psychiatric/Behavioral: Negative for depression. The patient is not nervous/anxious and does not have insomnia.      MEDICAL HISTORY:  Past Medical History:  Diagnosis Date   Anemia    Anxiety    ARF (acute respiratory failure) (HCC)    Bladder cancer (HCC)    COPD (chronic obstructive pulmonary disease) ([place])    Depression    Dysphagia    Family history of colon cancer    Family history of kidney cancer    Family history of leukemia    Family history of prostate cancer    GERD (gastroesophageal reflux disease)    Hepatitis    chronic hep c   Hydronephrosis    Hydronephrosis with ureteral stricture    Hyperlipidemia    Knee pain    Left   Malignant neoplasm of colon ([place])    Nerve pain    Peripheral vascular disease ([place])    Prostate cancer ([place])    Stroke ([place])    Urinary frequency    Venous hypertension of both lower extremities     SURGICAL HISTORY: Past Surgical History:  Procedure Laterality Date   COLON SURGERY     En bloc  extended right hemicolectomy 07/2017   CYSTOSCOPY W/ RETROGRADES Right 08/30/2018   Procedure: CYSTOSCOPY WITH RETROGRADE PYELOGRAM;  Surgeon: Hollice Espy, MD;  Location: [facility];  Service: Urology;  Laterality: Right;   CYSTOSCOPY WITH STENT PLACEMENT Right 04/25/2018   Procedure: CYSTOSCOPY WITH STENT PLACEMENT;  Surgeon: Hollice Espy, MD;  Location: [facility];   Service: Urology;  Laterality: Right;   CYSTOSCOPY WITH STENT PLACEMENT Right 08/30/2018   Procedure: [place] Exchange;  Surgeon: Hollice Espy, MD;  Location: [facility];  Service: Urology;  Laterality: Right;   CYSTOSCOPY WITH STENT PLACEMENT Right 03/07/2019   Procedure: CYSTOSCOPY WITH STENT Exchange;  Surgeon: Hollice Espy, MD;  Location: [facility];  Service: Urology;  Laterality: Right;   CYSTOSCOPY WITH STENT PLACEMENT Right 11/21/2019   Procedure: CYSTOSCOPY WITH STENT Exchange;  Surgeon: Hollice Espy, MD;  Location: [facility];  Service: Urology;  Laterality: Right;   LOWER EXTREMITY ANGIOGRAPHY Left 05/23/2019   Procedure: LOWER EXTREMITY ANGIOGRAPHY;  Surgeon: Algernon Huxley, MD;  Location: [facility];  Service: Cardiovascular;  Laterality: Left;   LOWER EXTREMITY ANGIOGRAPHY Right 05/30/2019   Procedure: LOWER EXTREMITY ANGIOGRAPHY;  Surgeon: Algernon Huxley, MD;  Location: [facility];  Service: Cardiovascular;  Laterality: Right;   LOWER EXTREMITY ANGIOGRAPHY Right 02/13/2020   Procedure: LOWER EXTREMITY ANGIOGRAPHY;  Surgeon: Algernon Huxley, MD;  Location: [facility];  Service: Cardiovascular;  Laterality: Right;   LOWER EXTREMITY ANGIOGRAPHY Left 02/20/2020   Procedure: LOWER EXTREMITY ANGIOGRAPHY;  Surgeon: Algernon Huxley, MD;  Location: [facility];  Service: Cardiovascular;  Laterality: Left;   PORTA CATH INSERTION N/A 02/28/2019   Procedure: PORTA CATH INSERTION;  Surgeon: Algernon Huxley, MD;  Location: [facility];  Service: Cardiovascular;  Laterality: N/A;   tumor removed       SOCIAL HISTORY: Social History   Socioeconomic History   Marital status: Single    Spouse name: Not on file   Number of children: Not on file   Years of education: Not on file   Highest education level: Not on file  Occupational History   Not on file  Tobacco Use   Smoking status: Current Every Day Smoker    Packs/day: 1.00     Types: Cigarettes   Smokeless tobacco: Never Used  Vaping Use   Vaping Use: Never used  Substance and Sexual Activity   Alcohol use: Not Currently   Drug use: Not Currently   Sexual activity: Not Currently  Other Topics Concern   Not on file  Social History Narrative    used to live [place]; moved  To [place]- end of April 2019; in Nursing home; 1pp/day; quit alcohol. 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        Family- dad- prostate ca [at 53y]; brother- 39 died of prostate cancer; brother- 90- no cancers [[place]]; sonGerald Stabs [[place]];[name]-32y prostate ca [facility] [place]]; daughter- 77 [[place]]; another daughter 79 [[place]/addict]. will refer genetics counseling. Given MSI- abnormal; highly suspicious of Lynch syndrome.  Patient's son [name] aware of high possible lynch syndrome.   Social Determinants of Health   Financial Resource Strain: Not on file  Food Insecurity: Not on file  Transportation Needs: Not on file  Physical Activity: Not on file  Stress: Not on file  Social Connections: Not on file  Intimate Partner Violence: Not on file    FAMILY HISTORY: Family History  Problem Relation Age of Onset   Prostate  cancer Father 53   Cancer Brother 79       unsure type   Cancer Paternal Uncle        unsure type   Cancer Maternal Grandmother        unsure type   Cancer Paternal Grandmother        unsure type   Kidney cancer Paternal Grandfather    Cancer Other        unsure types   Leukemia Son    Cancer Son        other cancers, possibly colon    ALLERGIES:  is allergic to penicillins.  MEDICATIONS:  Current Outpatient Medications  Medication Sig Dispense Refill   acetaminophen (TYLENOL) 325 MG tablet Take 650 mg by mouth 3 (three) times daily.      aspirin EC 81 MG tablet Take 1 tablet (81 mg total) by mouth daily. 150 tablet 2   clopidogrel (PLAVIX) 75 MG tablet Take 1 tablet (75 mg total) by mouth daily. 30 tablet 11   cyclobenzaprine (FLEXERIL) 10 MG tablet  Take 10 mg by mouth at bedtime.     diclofenac sodium (VOLTAREN) 1 % GEL Apply 2 g topically See admin instructions. Apply 2 g topically 4 times daily. Apply 2 g to left shoulder, apply 2 g to left hip and 2 g to left knee 4 times daily per [facility] instructions     escitalopram (LEXAPRO) 5 MG tablet Take 5 mg by mouth daily.     gabapentin (NEURONTIN) 100 MG capsule Take 100 mg by mouth 3 (three) times daily.     mirtazapine (REMERON) 7.5 MG tablet Take 7.5 mg by mouth at bedtime.     oxycodone (OXY-IR) 5 MG capsule Take 10 mg by mouth every 6 (six) hours as needed for pain.     pantoprazole (PROTONIX) 40 MG tablet Take 40 mg by mouth daily.     atorvastatin (LIPITOR) 10 MG tablet Take 1 tablet (10 mg total) by mouth daily. 30 tablet 11   collagenase (SANTYL) ointment Apply 1 application topically daily. LEFT LOWER LEG (Patient not taking: Reported on 08/06/2020)     No current facility-administered medications for this visit.      Marland Kitchen  PHYSICAL EXAMINATION: ECOG PERFORMANCE STATUS: 1 - Symptomatic but completely ambulatory  Vitals:   08/06/20 0847  BP: 103/74  Pulse: 82  Resp: 16  Temp: (!) 97.5 F (36.4 C)  SpO2: 100%   Filed Weights   08/06/20 0847  Weight: 165 lb (74.8 kg)    Physical Exam Constitutional:      Comments: In wheelchair. Cachectic.  Appears older than his stated age.  Alone.  HENT:     Head: Normocephalic and atraumatic.     Mouth/Throat:     Pharynx: No oropharyngeal exudate.  Eyes:     Pupils: Pupils are equal, round, and reactive to light.  Cardiovascular:     Rate and Rhythm: Normal rate and regular rhythm.  Pulmonary:     Effort: No respiratory distress.     Breath sounds: No wheezing.  Abdominal:     General: Bowel sounds are normal. There is no distension.     Palpations: Abdomen is soft. There is no mass.     Tenderness: There is no abdominal tenderness. There is no guarding or rebound.  Musculoskeletal:        General: No tenderness.  Normal range of motion.     Cervical back: Normal range of motion and neck supple.  Skin:    General: Skin is warm.     Comments: Bilateral lower extremity ulcerations noted.  Pulses intact bilaterally.  Neurological:     Mental Status: He is oriented to person, place, and time.     Comments: Sleepy but easily arousable.  Chronic weakness left upper and lower extremity.  Psychiatric:        Mood and Affect: Affect normal.      LABORATORY DATA:  I have reviewed the data as listed Lab Results  Component Value Date   WBC 9.1 08/06/2020   HGB 12.2 (L) 08/06/2020   HCT 39.2 08/06/2020   MCV 75.8 (L) 08/06/2020   PLT 255 08/06/2020   Recent Labs    03/08/20 0905 03/29/20 0824 04/19/20 0858 05/10/20 0915 05/31/20 0821 07/13/20 0811 08/06/20 0832  NA 137 139 134*   < > 136 138 136  K 4.4 4.1 3.8   < > 4.1 4.4 4.5  CL 103 104 101   < > 102 105 103  CO2 _0 < > 25 24 21*  GLUCOSE 88 94 140*   < > 99 93 92  BUN _1 < > _2 CREATININE 0.85 0.76 0.92   < > 0.98 0.85 0.91  CALCIUM 8.8* 8.6* 8.4*   < > 8.8* 8.8* 8.6*  GFRNONAA >60 >60 >60   < > >60 >60 >60  GFRAA >60 >60 >60  --   --   --   --   PROT 8.0 7.7 7.8   < > 7.8 8.0 8.0  ALBUMIN 3.8 3.6 3.7   < > 3.5 3.5 3.4*  AST 32 28 33   < > 27 27 32  ALT _3 < > _4 ALKPHOS 127* 118 115   < > 109 95 99  BILITOT 0.6 0.4 0.4   < > 0.5 0.5 0.6   < > = values in this interval not displayed.    RADIOGRAPHIC STUDIES: I have personally reviewed the radiological images as listed and agreed with the findings in the report. CT CHEST ABDOMEN PELVIS W CONTRAST  Result Date: 08/01/2020 CLINICAL DATA:  Restaging urologic neoplasm. History of right ureteral neoplasm. Patient undergoing immunotherapy with Keytruda. EXAM: CT CHEST, ABDOMEN, AND PELVIS WITH CONTRAST TECHNIQUE: Multidetector CT imaging of the chest, abdomen and pelvis was performed following the standard protocol during bolus administration of  intravenous contrast. CONTRAST:  11m OMNIPAQUE IOHEXOL 300 MG/ML  SOLN COMPARISON:  Abdominal/pelvic CT scan 04/17/2020 and chest CT 12/09/2019. FINDINGS: CT CHEST FINDINGS Cardiovascular: The heart is normal in size. No pericardial effusion. The aorta is normal in caliber. No dissection. Stable coronary artery calcifications. The right IJ power port is in good position. No complicating features. Mediastinum/Nodes: No mediastinal hilar mass or adenopathy. Small scattered lymph nodes are stable. The esophagus is grossly normal. Lungs/Pleura: Dependent bibasilar subpleural atelectasis but no infiltrates or effusions. Exam is somewhat limited by breathing motion artifact. No worrisome pulmonary nodules to suggest pulmonary metastatic disease. No pleural effusions or pleural nodules. Musculoskeletal: No chest wall mass, supraclavicular or axillary adenopathy. Small scattered axillary lymph nodes are stable bilaterally. The bony thorax is intact. No findings suspicious for osseous metastatic disease. CT ABDOMEN PELVIS FINDINGS Hepatobiliary: No hepatic lesions or intrahepatic biliary dilatation. Gallbladder is unremarkable. No common bile duct dilatation. Pancreas: No mass, inflammation or ductal dilatation. Stable mild pancreatic atrophy. Spleen: Stable borderline splenic enlargement. No splenic  lesions. Adrenals/Urinary Tract: The adrenal glands are normal in stable. The left kidney is unremarkable. There is a stable left renal cyst. No worrisome renal lesions. The left ureter is unremarkable. The right kidney demonstrates stable chronic hydronephrosis despite the double-J ureteral stent. There is associated moderate renal cortical thinning which appears progressive. Stable diffuse soft tissue thickening and enhancement involving the upper right ureter extending into the renal pelvis. Findings consistent with known ureteral cancer. The upper ureteral soft tissue thickening appears progressive with the ureter  measuring a maximum of 16 mm on image 80/2. This measured a maximum of 12 mm on the prior CT scan. Findings worrisome for progressive tumor. There is also vague soft tissue thickening around the distal right ureter/UVJ region which could also represent tumor. No obvious bladder mass. Median lobe hypertrophy of the prostate gland is noted impressing on the base of the bladder. Stomach/Bowel: The stomach, duodenum and small bowel are unremarkable. Asymmetric colonic wall thickening involving the descending colon upper sigmoid colon junction region. This was less apparent on the study from 12/09/2019 and 04/17/2020. In retrospect I think this area was hypermetabolic on the prior PET-CT from 2019. Could not exclude colonic neoplasm. Recommend correlation with colonoscopy. Vascular/Lymphatic: Stable age advanced atherosclerotic calcifications involving the aorta and branch vessels. No aneurysm or dissection. Stable small scattered upper abdominal lymph nodes. Small scattered stable retroperitoneal lymph nodes. Numerous bilateral pelvic lymph nodes are also stable. No new or progressive findings are identified. Small inguinal lymph nodes are also stable. Reproductive: Mild prostate gland enlargement with median lobe hypertrophy impressing on the base of the bladder. The seminal vesicles appear normal. Other: No free abdominal or free pelvic fluid collections. Musculoskeletal: No significant bony findings. No findings suspicious for metastatic bone disease. Stable age advanced degenerative changes involving both hips. IMPRESSION: 1. CT findings worrisome for progressive right renal pelvis and ureteral tumor. There is also progressive right renal cortical thinning, decreased perfusion and hydronephrosis. 2. Overall stable pelvic lymphadenopathy. No new or progressive findings. 3. Findings suspicious for colonic neoplasm involving the descending colon sigmoid colon junction region. Recommend colonoscopy. 4. Mild soft tissue  thickening around the distal right ureter could reflect ureteral tumor. Aortic Atherosclerosis (ICD10-I70.0). Electronically Signed   By: Marijo Sanes M.D.   On: 08/01/2020 16:31    ASSESSMENT & PLAN:   Ureteral cancer, right ([place]) # High-grade urothelial cancer/cytology; likely of the right renal pelvis /upper ureter. JAN 5th, 2022- CT findings worrisome for progressive right renal pelvis and ureteral tumor. There is also progressive right renal cortical thinning, decreased perfusion and hydronephrosis; and mass suspicious for colonic neoplasm involving the descending colon sigmoid colon junction region.  Mild soft tissue thickening around the distal right ureter could reflect ureteral tumor-see discussion below.    # Proceed with [place] today; Labs today reviewed;  acceptable for treatment today.  However recommended PET scan for further evaluation; and biopsy consideration-for NGS.  Otherwise patient will be candidate for antibody drug conjugate therapy. Will discuss at tumor conference.   #Descending colon mass/intermittent constipation-no evidence of obstruction.-history of Lynch syndrome.  Await PET scan patient will need repeat colonoscopy.  Will refer to GI.  # Iron deficiency anemia-hemoglobin 12.0-STABLE.   # Bilateral LE ulcers-s/p wound care evaluation; s/p PTCA with Dr.Dew. bil Arterial Dopp-wnl. STABLE.   #DISPOSITION: [name] today;  # PET scan ASAP # follow up in 3 weeks-;MD labs-cbc/cmp;  Keytruda Dr.B  # I reviewed the blood work- with the patient in detail; also reviewed  the imaging independently [as summarized above]; and with the patient in detail.    All questions were answered. The patient knows to call the clinic with any problems, questions or concerns.    Cammie Sickle, MD 08/07/2020 8:15 AM

## 2020-08-06 NOTE — Progress Notes (Signed)
Pt tolerated infusion well with no signs of complications. VSS. Transportation called. Pt escorted out via wheelchair. Pt stable for discharge.   Shaili Donalson [employer]

## 2020-08-06 NOTE — Assessment & Plan Note (Addendum)
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 JAN 5th, 2022- CT findings worrisome for progressive right renal pelvis and ureteral tumor. There is also progressive right renal cortical thinning, decreased perfusion and hydronephrosis; and mass suspicious for colonic neoplasm involving the descending colon sigmoid colon junction region.  Mild soft tissue thickening around the distal right ureter could reflect ureteral tumor-see discussion below.    # Proceed with [place] today; Labs today reviewed;  acceptable for treatment today.  However recommended PET scan for further evaluation; and biopsy consideration-for NGS.  Otherwise patient will be candidate for antibody drug conjugate therapy. Will discuss at tumor conference.   #Descending colon mass/intermittent constipation-no evidence of obstruction.-history of Lynch syndrome.  Await PET scan patient will need repeat colonoscopy.  Will refer to GI.  # Iron deficiency anemia-hemoglobin 12.0-STABLE.   # Bilateral LE ulcers-s/p wound care evaluation; s/p PTCA with Dr.Dew. bil Arterial Dopp-wnl. STABLE.   #DISPOSITION: [name] today;  # PET scan ASAP # follow up in 3 weeks-;MD labs-cbc/cmp;  Keytruda Dr.B  # I reviewed the blood work- with the patient in detail; also reviewed the imaging independently [as summarized above]; and with the patient in detail.

## 2020-08-09 ENCOUNTER — Other Ambulatory Visit: Payer: Medicaid Other

## 2020-08-09 NOTE — Progress Notes (Signed)
Tumor Board Documentation  Sterling Mondo was presented by Dr [name] at our Tumor Board on 08/09/2020, which included representatives from medical oncology,radiation oncology,internal medicine,navigation,pathology,radiology,surgical,pharmacy,genetics,research,palliative care,pulmonology.  Terry currently presents as a current patient,for discussion with history of the following treatments: active survellience,immunotherapy,surgical intervention(s).  Additionally, we reviewed previous medical and familial history, history of present illness, and recent lab results along with all available histopathologic and imaging studies. The tumor board considered available treatment options and made the following recommendations:   Colonoscopy  The following procedures/referrals were also placed: No orders of the defined types were placed in this encounter.   Clinical Trial Status: not discussed   Staging used:    AJCC Staging:       Group: Colon Cancer, Urothelial Cancer, Lynch Syndrome   National site-specific guidelines   were discussed with respect to the case.  Tumor board is a meeting of clinicians from various specialty areas who evaluate and discuss patients for whom a multidisciplinary approach is being considered. Final determinations in the plan of care are those of the provider(s). The responsibility for follow up of recommendations given during tumor board is that of the provider.   Today's extended care, comprehensive team conference, [place] was not present for the discussion and was not examined.   Multidisciplinary Tumor Board is a multidisciplinary case peer review process.  Decisions discussed in the Multidisciplinary Tumor Board reflect the opinions of the specialists present at the conference without having examined the patient.  Ultimately, treatment and diagnostic decisions rest with the primary provider(s) and the patient.

## 2020-08-13 ENCOUNTER — Ambulatory Visit: Payer: Medicaid Other | Admitting: Physician Assistant

## 2020-08-14 ENCOUNTER — Other Ambulatory Visit: Payer: Self-pay

## 2020-08-14 ENCOUNTER — Encounter: Payer: Medicaid Other | Attending: Physician Assistant | Admitting: Physician Assistant

## 2020-08-14 ENCOUNTER — Other Ambulatory Visit: Payer: Self-pay | Admitting: *Deleted

## 2020-08-14 DIAGNOSIS — L97412 Non-pressure chronic ulcer of right heel and midfoot with fat layer exposed: Secondary | ICD-10-CM | POA: Diagnosis not present

## 2020-08-14 DIAGNOSIS — L97322 Non-pressure chronic ulcer of left ankle with fat layer exposed: Secondary | ICD-10-CM | POA: Insufficient documentation

## 2020-08-14 DIAGNOSIS — L97512 Non-pressure chronic ulcer of other part of right foot with fat layer exposed: Secondary | ICD-10-CM | POA: Diagnosis not present

## 2020-08-14 DIAGNOSIS — Z880 Allergy status to penicillin: Secondary | ICD-10-CM | POA: Diagnosis not present

## 2020-08-14 DIAGNOSIS — I69354 Hemiplegia and hemiparesis following cerebral infarction affecting left non-dominant side: Secondary | ICD-10-CM | POA: Insufficient documentation

## 2020-08-14 DIAGNOSIS — I872 Venous insufficiency (chronic) (peripheral): Secondary | ICD-10-CM | POA: Insufficient documentation

## 2020-08-14 DIAGNOSIS — F17210 Nicotine dependence, cigarettes, uncomplicated: Secondary | ICD-10-CM | POA: Insufficient documentation

## 2020-08-14 DIAGNOSIS — C182 Malignant neoplasm of ascending colon: Secondary | ICD-10-CM

## 2020-08-14 DIAGNOSIS — Z1509 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: Secondary | ICD-10-CM

## 2020-08-14 NOTE — Progress Notes (Signed)
GI referral entered- Received message from Dr. [name]. "please make a referral to Dr.Anna/ Gi re: colon mass/Lynch syndrome; need for coloscnosopy"

## 2020-08-15 ENCOUNTER — Other Ambulatory Visit
Admission: RE | Admit: 2020-08-15 | Discharge: 2020-08-15 | Disposition: A | Discharge status: Home / Self Care | Payer: Medicaid Other | Source: Ambulatory Visit | Attending: Physician Assistant | Admitting: Physician Assistant

## 2020-08-15 DIAGNOSIS — L97322 Non-pressure chronic ulcer of left ankle with fat layer exposed: Secondary | ICD-10-CM | POA: Diagnosis not present

## 2020-08-16 ENCOUNTER — Ambulatory Visit: Admission: RE | Admit: 2020-08-16 | Payer: Medicaid Other | Source: Ambulatory Visit

## 2020-08-17 NOTE — Progress Notes (Signed)
Chad [name], Chad [name] ([id number]) Visit Report for 08/14/2020 Allergy List Details Patient Name: Chad [name], Chad [name] Date of Service: 08/14/2020 1:15 PM Medical Record Number: [id number] Patient Account Number: [id number] Date of Birth/Sex: 16-Apr-1961 (60 y.o. M) Treating RN: Carlene Coria Primary Care Perian Tedder: Clayborn Bigness Other Clinician: Referring Joann Kulpa: Clayborn Bigness Treating Daya Dutt/Extender: Jeri Cos Weeks in Treatment: 0 Allergies Active Allergies penicillin Reaction: rash Severity: Moderate Type: Food Allergy Notes Electronic Signature(s) Signed: 08/17/2020 10:25:07 AM By: Carlene Coria RN Entered By: Carlene Coria on 08/14/2020 14:01:38 Chad [name] ([id number]) -------------------------------------------------------------------------------- Arrival Information Details Patient Name: Chad [name] Date of Service: 08/14/2020 1:15 PM Medical Record Number: [id number] Patient Account Number: [id number] Date of Birth/Sex: [birth year] (60 y.o. M) Treating RN: Carlene Coria Primary Care Calib Wadhwa: Clayborn Bigness Other Clinician: Referring Jerusha Reising: Clayborn Bigness Treating Sakina Briones/Extender: Skipper Cliche in Treatment: 0 Visit Information Patient Arrived: Wheel Chair Arrival Time: 13:32 Accompanied By: self Transfer Assistance: None Patient Identification Verified: Yes Secondary Verification Process Completed: Yes Patient Requires Transmission-Based No Precautions: Patient Has Alerts: Yes Patient Alerts: Patient on Blood Thinner ABI right 1.16 ABI left 1.14 History Since Last Visit All ordered tests and consults were completed: No Added or deleted any medications: No Any new allergies or adverse reactions: No Had a fall or experienced change in activities of daily living that may affect risk of falls: No Signs or symptoms of abuse/neglect since last visito No Hospitalized since last visit: No Implantable device outside of the clinic excluding cellular tissue based products  placed in the center since last visit: No Has Dressing in Place as Prescribed: Yes Electronic Signature(s) Signed: 08/17/2020 10:25:07 AM By: Carlene Coria RN Entered By: Carlene Coria on 08/14/2020 14:01:31 Chad [name] ([id number]) -------------------------------------------------------------------------------- Clinic Level of Care Assessment Details Patient Name: Chad [name] Date of Service: 08/14/2020 1:15 PM Medical Record Number: [id number] Patient Account Number: [id number] Date of Birth/Sex: 1961/01/07 (60 y.o. M) Treating RN: Carlene Coria Primary Care Jodel Mayhall: Clayborn Bigness Other Clinician: Referring Alpha Mysliwiec: Clayborn Bigness Treating Randen Kauth/Extender: Skipper Cliche in Treatment: 0 Clinic Level of Care Assessment Items TOOL 2 Quantity Score X - Use when only an EandM is performed on the INITIAL visit 1 0 ASSESSMENTS - Nursing Assessment / Reassessment X - General Physical Exam (combine w/ comprehensive assessment (listed just below) when performed on new 1 20 pt. evals) X- 1 25 Comprehensive Assessment (HX, ROS, Risk Assessments, Wounds Hx, etc.) ASSESSMENTS - Wound and Skin Assessment / Reassessment []  - Simple Wound Assessment / Reassessment - one wound 0 X- 3 5 Complex Wound Assessment / Reassessment - multiple wounds []  - 0 Dermatologic / Skin Assessment (not related to wound area) ASSESSMENTS - Ostomy and/or Continence Assessment and Care []  - Incontinence Assessment and Management 0 []  - 0 Ostomy Care Assessment and Management (repouching, etc.) PROCESS - Coordination of Care X - Simple Patient / Family Education for ongoing care 1 15 []  - 0 Complex (extensive) Patient / Family Education for ongoing care X- 1 10 Staff obtains Programmer, systems, Records, Test Results / Process Orders []  - 0 Staff telephones HHA, Nursing Homes / Clarify orders / etc []  - 0 Routine Transfer to another Facility (non-emergent condition) []  - 0 Routine Hospital Admission (non-emergent  condition) X- 1 15 New Admissions / Biomedical engineer / Ordering NPWT, Apligraf, etc. []  - 0 Emergency Hospital Admission (emergent condition) X- 1 10 Simple Discharge Coordination []  - 0 Complex (extensive) Discharge Coordination PROCESS - Special Needs []  - Pediatric / Minor Patient Management 0 []  - 0 Isolation Patient  Management []  - 0 Hearing / Language / Visual special needs []  - 0 Assessment of Community assistance (transportation, D/C planning, etc.) []  - 0 Additional assistance / Altered mentation []  - 0 Support Surface(s) Assessment (bed, cushion, seat, etc.) INTERVENTIONS - Wound Cleansing / Measurement X - Wound Imaging (photographs - any number of wounds) 1 5 []  - 0 Wound Tracing (instead of photographs) []  - 0 Simple Wound Measurement - one wound X- 3 5 Complex Wound Measurement - multiple wounds Chad [name], Chad [name] ( ) []  - 0 Simple Wound Cleansing - one wound X- 3 5 Complex Wound Cleansing - multiple wounds INTERVENTIONS - Wound Dressings X - Small Wound Dressing one or multiple wounds 2 10 X- 1 15 Medium Wound Dressing one or multiple wounds []  - 0 Large Wound Dressing one or multiple wounds []  - 0 Application of Medications - injection INTERVENTIONS - Miscellaneous []  - External ear exam 0 X- 1 5 Specimen Collection (cultures, biopsies, blood, body fluids, etc.) []  - 0 Specimen(s) / Culture(s) sent or taken to Lab for analysis []  - 0 Patient Transfer (multiple staff / / Similar devices) []  - 0 Simple Staple / Suture removal (25 or less) []  - 0 Complex Staple / Suture removal (26 or more) []  - 0 Hypo / Hyperglycemic Management (close monitor of Blood Glucose) X- 1 15 Ankle / Brachial Index (ABI) - do not check if billed separately Has the patient been seen at the hospital within the last three years: Yes Total Score: 200 Level Of Care: New/Established - Level 5 Electronic Signature(s) Signed: 08/17/2020 10:25:07 AM  By: RN Entered By: on 08/14/2020 16:57:10 [id number] ( ) -------------------------------------------------------------------------------- Encounter Discharge Information Details Patient Name: Date of Service: 08/14/2020 1:15 PM Medical Record Number: Patient Account Number: Date of Birth/Sex: 1960-11-13 (60 y.o. M) Treating RN: Primary Care Buster Schueller: Other Clinician: Referring Aysel Gilchrest: Treating Deavion Strider/Extender: 08/19/2020 in Treatment: 0 Encounter Discharge Information Items Discharge Condition: Stable Ambulatory Status: Wheelchair Discharge Destination: Home Transportation: Private Auto Accompanied By: self Schedule Follow-up Appointment: Yes Clinical Summary of Care: Patient Declined Electronic Signature(s) Signed: 08/17/2020 10:25:07 AM By: Yevonne Pax RN Entered By: 08/16/2020 on 08/14/2020 17:01:13 [id number] (Chad [name]) -------------------------------------------------------------------------------- Lower Extremity Assessment Details Patient Name: 08/16/2020 Date of Service: 08/14/2020 1:15 PM Medical Record Number: [id number] Patient Account Number: 11/27/1960 Date of Birth/Sex: 1961/04/08 (59 y.o. M) Treating RN: Beverely Risen Primary Care Jashay Roddy: Beverely Risen Other Clinician: Referring Adrianah Prophete: Rowan Blase Treating Travus Oren/Extender: 08/19/2020 Weeks in Treatment: 0 Edema Assessment Assessed: [Left: No] [Right: No] [Left: Edema] [Right: :] Calf Left: Right: Point of Measurement: 33 cm From Medial Instep 33.6 cm 34 cm Ankle Left: Right: Point of Measurement: 10 cm From Medial Instep 20.5 cm 21 cm Vascular Assessment Pulses: Dorsalis Pedis Palpable: [Left:Yes] [Right:Yes] Electronic Signature(s) Signed: 08/17/2020 10:25:07 AM By: Yevonne Pax RN Entered By: 08/16/2020 on 08/14/2020 14:00:52 [id number]  (Chad [name]) -------------------------------------------------------------------------------- Multi Wound Chart Details Patient Name: 08/16/2020 Date of Service: 08/14/2020 1:15 PM Medical Record Number: [id number] Patient Account Number: 11/27/1960 Date of Birth/Sex: 14-Jul-1961 (60 y.o. M) Treating RN: Beverely Risen Primary Care Sharina Petre: Beverely Risen Other Clinician: Referring Kmari Brian: [name] Treating Sherre Wooton/Extender: 08/19/2020 Weeks in Treatment: 0 Vital Signs Height(in): 67 Pulse(bpm): 93 Weight(lbs): 160 Blood Pressure(mmHg): 128/90 Body Mass Index(BMI): 25 Temperature(F): 98.2 Respiratory Rate(breaths/min): 18 Photos: Wound Location: Right Toe Second Right Calcaneus Right, Medial Ankle Wounding Event: Gradually  Appeared Gradually Appeared Gradually Appeared Primary Etiology: To be determined To be determined To be determined Date Acquired: 03/28/2020 08/29/2019 07/12/2019 Weeks of Treatment: 0 0 0 Wound Status: Open Open Open Measurements L x W x D (cm) 1.7x0.9x0.1 1.1x0.5x0.1 3x2.5x0.1 Area (cm) : 1.202 0.432 5.89 Volume (cm) : 0.12 0.043 0.589 % Reduction in Area: 0.00% N/A N/A % Reduction in Volume: 0.00% N/A N/A Classification: Full Thickness Without Exposed Full Thickness Without Exposed Full Thickness Without Exposed Support Structures Support Structures Support Structures Exudate Amount: Medium Medium Medium Exudate Type: Serosanguineous Serosanguineous Serosanguineous Exudate Color: red, brown red, brown red, brown Granulation Amount: Small (1-33%) Medium (34-66%) Small (1-33%) Granulation Quality: Pink Pink, Pale Red, Pink Necrotic Amount: Large (67-100%) Medium (34-66%) Large (67-100%) Necrotic Tissue: Eschar, Adherent Chad [name] Exposed Structures: Fat Layer (Subcutaneous Tissue): Fat Layer (Subcutaneous Tissue): Fat Layer (Subcutaneous Tissue): Yes Yes Yes Fascia: No Fascia: No Fascia: No Tendon:  No Tendon: No Tendon: No Muscle: No Muscle: No Muscle: No Joint: No Joint: No Joint: No Bone: No Bone: No Bone: No Epithelialization: None None None Treatment Notes Electronic Signature(s) Signed: 08/17/2020 10:25:07 AM By: Carlene Coria RN Entered By: Carlene Coria on 08/14/2020 14:18:09 Chad [name] ([id number]) -------------------------------------------------------------------------------- Multi-Disciplinary Care Plan Details Patient Name: Chad [name] Date of Service: 08/14/2020 1:15 PM Medical Record Number: [id number] Patient Account Number: [id number] Date of Birth/Sex: 01-31-1961 (60 y.o. M) Treating RN: Carlene Coria Primary Care Angelia Hazell: Clayborn Bigness Other Clinician: Referring Meggin Ola: Clayborn Bigness Treating Domenica Weightman/Extender: Jeri Cos Weeks in Treatment: 0 Active Inactive Wound/Skin Impairment Nursing Diagnoses: Knowledge deficit related to ulceration/compromised skin integrity Goals: Patient/caregiver will verbalize understanding of skin care regimen Date Initiated: 08/14/2020 Target Resolution Date: 09/14/2020 Goal Status: Active Ulcer/skin breakdown will have a volume reduction of 30% by week 4 Date Initiated: 08/14/2020 Target Resolution Date: 09/14/2020 Goal Status: Active Interventions: Assess patient/caregiver ability to obtain necessary supplies Assess patient/caregiver ability to perform ulcer/skin care regimen upon admission and as needed Assess ulceration(s) every visit Notes: Electronic Signature(s) Signed: 08/17/2020 10:25:07 AM By: Carlene Coria RN Entered By: Carlene Coria on 08/14/2020 14:17:45 Chad [name] ([id number]) -------------------------------------------------------------------------------- Pain Assessment Details Patient Name: Chad [name] Date of Service: 08/14/2020 1:15 PM Medical Record Number: [id number] Patient Account Number: [id number] Date of Birth/Sex: [birth year] (60 y.o. M) Treating RN: Carlene Coria Primary Care  Delmer Kowalski: Clayborn Bigness Other Clinician: Referring Timohty Renbarger: Clayborn Bigness Treating Treylen Gibbs/Extender: Skipper Cliche in Treatment: 0 Active Problems Location of Pain Severity and Description of Pain Patient Has Paino Yes Site Locations With Dressing Change: Yes Duration of the Pain. Constant / Intermittento Constant Rate the pain. Current Pain Level: 5 Worst Pain Level: 10 Least Pain Level: 2 Tolerable Pain Level: 5 Character of Pain Describe the Pain: Burning Pain Management and Medication Current Pain Management: Medication: Yes Cold Application: No Rest: Yes Massage: No Activity: No T.E.N.S.: No Heat Application: No Leg drop or elevation: No Is the Current Pain Management Adequate: Inadequate How does your wound impact your activities of daily livingo Sleep: No Bathing: No Appetite: No Relationship With Others: No Bladder Continence: No Emotions: No Bowel Continence: No Work: No Toileting: No Drive: No Dressing: No Hobbies: No Electronic Signature(s) Signed: 08/17/2020 10:25:07 AM By: Carlene Coria RN Entered By: Carlene Coria on 08/14/2020 13:50:06 Chad [name] ([id number]) -------------------------------------------------------------------------------- Patient/Caregiver Education Details Patient Name: Chad [name] Date of Service: 08/14/2020 1:15 PM Medical Record Number: [id number] Patient Account Number: [id number] Date of Birth/Gender: [birth year] (60 y.o. M) Treating RN: Carlene Coria Primary Care  Physician: Clayborn Bigness Other Clinician: Referring Physician: Clayborn Bigness Treating Physician/Extender: Skipper Cliche in Treatment: 0 Education Assessment Education Provided To: Patient Education Topics Provided Wound/Skin Impairment: Methods: Explain/Verbal Responses: State content correctly Electronic Signature(s) Signed: 08/17/2020 10:25:07 AM By: Carlene Coria RN Entered By: Carlene Coria on 08/14/2020 17:00:19 Chad [name]  ([id number]) -------------------------------------------------------------------------------- Wound Assessment Details Patient Name: Chad [name] Date of Service: 08/14/2020 1:15 PM Medical Record Number: [id number] Patient Account Number: [id number] Date of Birth/Sex: 03-Jul-1961 (60 y.o. M) Treating RN: Carlene Coria Primary Care Kendre Jacinto: Clayborn Bigness Other Clinician: Referring Luree Palla: Clayborn Bigness Treating Fareeda Downard/Extender: Jeri Cos Weeks in Treatment: 0 Wound Status Wound Number: 6 Primary Etiology: To be determined Wound Location: Right Toe Second Wound Status: Open Wounding Event: Gradually Appeared Date Acquired: 03/28/2020 Weeks Of Treatment: 0 Clustered Wound: No Photos Wound Measurements Length: (cm) 1.7 Width: (cm) 0.9 Depth: (cm) 0.1 Area: (cm) 1.202 Volume: (cm) 0.12 % Reduction in Area: 0% % Reduction in Volume: 0% Epithelialization: None Tunneling: No Undermining: No Wound Description Classification: Full Thickness Without Exposed Support Structu Exudate Amount: Medium Exudate Type: Serosanguineous Exudate Color: red, brown res Foul Odor After Cleansing: No Slough/Fibrino Yes Wound Bed Granulation Amount: Small (1-33%) Exposed Structure Granulation Quality: Pink Fascia Exposed: No Necrotic Amount: Large (67-100%) Fat Layer (Subcutaneous Tissue) Exposed: Yes Necrotic Quality: Eschar, Adherent Slough Tendon Exposed: No Muscle Exposed: No Joint Exposed: No Bone Exposed: No Electronic Signature(s) Signed: 08/17/2020 10:25:07 AM By: Carlene Coria RN Entered By: Carlene Coria on 08/14/2020 13:57:21 Chad [name] ([id number]) -------------------------------------------------------------------------------- Wound Assessment Details Patient Name: Chad [name] Date of Service: 08/14/2020 1:15 PM Medical Record Number: [id number] Patient Account Number: [id number] Date of Birth/Sex: 04-24-1961 (60 y.o. M) Treating RN: Carlene Coria Primary Care  Tayten Bergdoll: Clayborn Bigness Other Clinician: Referring Kadasia Kassing: Clayborn Bigness Treating Lakeyia Surber/Extender: Jeri Cos Weeks in Treatment: 0 Wound Status Wound Number: 7 Primary Etiology: To be determined Wound Location: Right Calcaneus Wound Status: Open Wounding Event: Gradually Appeared Date Acquired: 08/29/2019 Weeks Of Treatment: 0 Clustered Wound: No Photos Wound Measurements Length: (cm) 1.1 Width: (cm) 0.5 Depth: (cm) 0.1 Area: (cm) 0.432 Volume: (cm) 0.043 % Reduction in Area: % Reduction in Volume: Epithelialization: None Tunneling: No Undermining: No Wound Description Classification: Full Thickness Without Exposed Support Structu Exudate Amount: Medium Exudate Type: Serosanguineous Exudate Color: red, brown res Foul Odor After Cleansing: No Slough/Fibrino Yes Wound Bed Granulation Amount: Medium (34-66%) Exposed Structure Granulation Quality: Pink, Pale Fascia Exposed: No Necrotic Amount: Medium (34-66%) Fat Layer (Subcutaneous Tissue) Exposed: Yes Necrotic Quality: Adherent Slough Tendon Exposed: No Muscle Exposed: No Joint Exposed: No Bone Exposed: No Electronic Signature(s) Signed: 08/17/2020 10:25:07 AM By: Carlene Coria RN Entered By: Carlene Coria on 08/14/2020 13:56:56 Chad [name] ([id number]) -------------------------------------------------------------------------------- Wound Assessment Details Patient Name: Chad [name] Date of Service: 08/14/2020 1:15 PM Medical Record Number: [id number] Patient Account Number: [id number] Date of Birth/Sex: Dec 02, 1960 (60 y.o. M) Treating RN: Carlene Coria Primary Care Masyn Rostro: Clayborn Bigness Other Clinician: Referring Seven Marengo: Clayborn Bigness Treating Jomel Whittlesey/Extender: Jeri Cos Weeks in Treatment: 0 Wound Status Wound Number: 8 Primary Etiology: To be determined Wound Location: Right, Medial Ankle Wound Status: Open Wounding Event: Gradually Appeared Date Acquired: 07/12/2019 Weeks Of Treatment:  0 Clustered Wound: No Photos Wound Measurements Length: (cm) 3 Width: (cm) 2.5 Depth: (cm) 0.1 Area: (cm) 5.89 Volume: (cm) 0.589 % Reduction in Area: % Reduction in Volume: Epithelialization: None Tunneling: No Undermining: No Wound Description Classification: Full Thickness Without Exposed Support Structu Exudate Amount: Medium Exudate Type: Serosanguineous Exudate Color: red,  brown res Foul Odor After Cleansing: No Slough/Fibrino Yes Wound Bed Granulation Amount: Small (1-33%) Exposed Structure Granulation Quality: Red, Pink Fascia Exposed: No Necrotic Amount: Large (67-100%) Fat Layer (Subcutaneous Tissue) Exposed: Yes Necrotic Quality: Eschar, Adherent Slough Tendon Exposed: No Muscle Exposed: No Joint Exposed: No Bone Exposed: No Electronic Signature(s) Signed: 08/17/2020 10:25:07 AM By: Yevonne Pax RN Entered By: Yevonne Pax on 08/14/2020 13:59:34 Chad [name] ([id number]) -------------------------------------------------------------------------------- Vitals Details Patient Name: Chad [name] Date of Service: 08/14/2020 1:15 PM Medical Record Number: [id number] Patient Account Number: [id number] Date of Birth/Sex: [birth year] (59 y.o. M) Treating RN: Yevonne Pax Primary Care Emeric Novinger: Beverely Risen Other Clinician: Referring Xitlalic Maslin: Beverely Risen Treating Kameka Whan/Extender: Rowan Blase in Treatment: 0 Vital Signs Time Taken: 13:50 Temperature (F): 98.2 Height (in): 67 Pulse (bpm): 93 Source: Stated Respiratory Rate (breaths/min): 18 Weight (lbs): 160 Blood Pressure (mmHg): 128/90 Source: Stated Reference Range: 80 - 120 mg / dl Body Mass Index (BMI): 25.1 Electronic Signature(s) Signed: 08/17/2020 10:25:07 AM By: Yevonne Pax RN Entered By: Yevonne Pax on 08/14/2020 13:50:39

## 2020-08-17 NOTE — Progress Notes (Addendum)
DRAPER, VALLIS ([id number]) Visit Report for 08/14/2020 Chief Complaint Document Details Patient Name: Chad [name] Date of Service: 08/14/2020 1:15 PM Medical Record Number: [id number] Patient Account Number: [id number] Date of Birth/Sex: 16-Aug-1960 (60 y.o. M) Treating RN: Cornell Barman Primary Care Provider: Clayborn Bigness Other Clinician: Referring Provider: Clayborn Bigness Treating Provider/Extender: Skipper Cliche in Treatment: 0 Information Obtained from: Patient Chief Complaint Left ankle and right foot and heel ulcers Electronic Signature(s) Signed: 08/14/2020 2:14:49 PM By: Worthy Keeler PA-C Entered By: Worthy Keeler on 08/14/2020 14:14:49 Chad [name] ([id number]) -------------------------------------------------------------------------------- HPI Details Patient Name: Chad [name] Date of Service: 08/14/2020 1:15 PM Medical Record Number: [id number] Patient Account Number: [id number] Date of Birth/Sex: 26-Feb-1961 (60 y.o. M) Treating RN: Cornell Barman Primary Care Provider: Clayborn Bigness Other Clinician: Referring Provider: Clayborn Bigness Treating Provider/Extender: Skipper Cliche in Treatment: 0 History of Present Illness HPI Description: 10/08/18 on evaluation today patient actually presents to our office for initial evaluation concerning wounds that he has of the bilateral lower extremities. 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 He also has bilateral venous stasis. He apparently has been homeless currently following discharge from the hospital apparently he has been placed at [facility] which is is a skilled nursing facility locally. Nonetheless fortunately he does not show any signs of infection at this time which is good news. In fact several of the wound actually appears to be showing some signs of  improvement already in my pinion. There are a couple areas in the left leg in particular there likely gonna require some sharp debridement to help clear away some necrotic tissue and help with more sufficient healing. No fevers, chills, nausea, or vomiting noted at this time. 10/15/18 on evaluation today patient actually appears to be doing very well in regard to his bilateral lower extremities. He's been tolerating the dressing changes without complication. Fortunately there does not appear to be any evidence of active infection at this time which is great news. Overall I'm actually very pleased with how this has progressed in just one visits time. Readmission: 08/14/2020 upon evaluation today patient presents for re-evaluation here in our clinic. He is having issues with his left ankle region as well as his right toe and his right heel. He tells me that the toe and heel actually began as a area that was itching that he was scratching and then subsequently opened up into wounds. These may have been abscess areas I presume based on what I am seeing currently. With regard to his left ankle region he tells me this was a similar type occurrence although he does have venous stasis this very well may be more of a venous leg ulcer more than anything. Nonetheless I do believe that the patient would benefit from appropriate and aggressive wound care to try to help get things under better control here. He does have history of a stroke on the left side affecting him to some degree there that he is able to stand although he does have some residual weakness. Otherwise again the patient does have chronic venous insufficiency as previously noted. His arterial studies most recently obtained showed that he had an ABI on the right of 1.16 with a TBI of 0.52 and on the left and ABI of 1.14 with a TBI of 0.81. That was obtained on 06/19/2020. Electronic Signature(s) Signed: 08/14/2020  3:29:22 PM By: Worthy Keeler  PA-C Entered By: Worthy Keeler on 08/14/2020 15:29:22 Chad [name] ([id number]) -------------------------------------------------------------------------------- Physical Exam Details Patient Name: Chad [name] Date of Service: 08/14/2020 1:15 PM Medical Record Number: [id number] Patient Account Number: [id number] Date of Birth/Sex: 17-May-1961 (60 y.o. M) Treating RN: Cornell Barman Primary Care Provider: Clayborn Bigness Other Clinician: Referring Provider: Clayborn Bigness Treating Provider/Extender: Jeri Cos Weeks in Treatment: 0 Constitutional sitting or standing blood pressure is within target range for patient.. pulse regular and within target range for patient.Marland Kitchen respirations regular, non- labored and within target range for patient.Marland Kitchen temperature within target range for patient.. Well-nourished and well-hydrated in no acute distress. Eyes conjunctiva clear no eyelid edema noted. pupils equal round and reactive to light and accommodation. Ears, Nose, Mouth, and Throat no gross abnormality of ear auricles or external auditory canals. normal hearing noted during conversation. mucus membranes moist. Respiratory normal breathing without difficulty. Cardiovascular 2+ dorsalis pedis/posterior tibialis pulses. 1+ pitting edema of the bilateral lower extremities. Musculoskeletal unsteady while walking. Psychiatric this patient is able to make decisions and demonstrates good insight into disease process. Alert and Oriented x 3. pleasant and cooperative. Notes Upon inspection patient's wounds currently showed signs of some inflammation especially on the left ankle area where there does appear to potentially be infection here as well. I am concerned about that. I am going to obtain a wound culture today for further evaluation of this area. I did actually go ahead as well and place him on Bactrim DS I think this is a good option as a broad-spectrum antibiotic. The patient is in agreement with that  plan. No sharp debridement was performed today especially in light of the discomfort that he was experiencing. Electronic Signature(s) Signed: 08/14/2020 3:30:40 PM By: Worthy Keeler PA-C Entered By: Worthy Keeler on 08/14/2020 15:30:39 Chad [name] ([id number]) -------------------------------------------------------------------------------- Physician Orders Details Patient Name: Chad [name] Date of Service: 08/14/2020 1:15 PM Medical Record Number: [id number] Patient Account Number: [id number] Date of Birth/Sex: 03-May-1961 (59 y.o. 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 Bathing/ Shower/ Hygiene o May shower with wound dressing protected with water repellent cover or cast protector. Edema Control - Lymphedema / Segmental Compressive Device / Other o Elevate, Exercise Daily and Avoid Standing for Long Periods of Time. o Elevate legs to the level of the heart and pump ankles as often as possible o Elevate leg(s) parallel to the floor when sitting. o DO YOUR BEST to sleep in the bed at night. DO NOT sleep in your recliner. Long hours of sitting in a recliner leads to swelling of the legs and/or potential wounds on your backside. Wound Treatment Wound #6 - Toe Second Wound Laterality: Right Cleanser: Normal Saline  (Generic) 1 x Per Day/30 Days Discharge Instructions: Wash your hands with soap and water. Remove old dressing, discard into plastic bag and place into trash. Cleanse the wound with Normal Saline prior to applying a clean dressing using gauze sponges, not tissues  or cotton balls. Do not scrub or use excessive force. Pat dry using gauze sponges, not tissue or cotton balls. Primary Dressing: Santyl Collagenase Ointment, 30 (gm), tube (Generic) 1 x Per Day/30 Days Discharge Instructions: apply nickle thick to wound bed Secondary Dressing: Bordered Gauze Sterile-HBD 4x4 (in/in) (Generic) 1 x Per Day/30 Days Discharge Instructions: Cover wound with Bordered Guaze Sterile as directed Wound #7 - Calcaneus Wound Laterality: Right Cleanser: Normal Saline (Generic) 1 x Per Day/30 Days Discharge Instructions: Wash your hands with soap and water. Remove old dressing, discard into plastic bag and place into trash. Cleanse the wound with Normal Saline prior to applying a clean dressing using gauze sponges, not tissues or cotton balls. Do not scrub or use excessive force. Pat dry using gauze sponges, not tissue or cotton balls. Primary Dressing: Santyl Collagenase Ointment, 30 (gm), tube (Generic) 1 x Per Day/30 Days Discharge Instructions: apply nickle thick to wound bed Secondary Dressing: Bordered Gauze Sterile-HBD 4x4 (in/in) (Generic) 1 x Per Day/30 Days Discharge Instructions: Cover wound with Bordered Guaze Sterile as directed Wound #8 - Ankle Wound Laterality: Left, Medial Cleanser: Normal Saline (Generic) 3 x Per Week/30 Days Discharge Instructions: Wash your hands with soap and water. Remove old dressing, discard into plastic bag and place into trash. Cleanse the wound with Normal Saline prior to applying a clean dressing using gauze sponges, not tissues or cotton balls. Do not scrub or use excessive force. Pat dry using gauze sponges, not tissue or cotton balls. Primary Dressing: Hydrofera Blue  Ready Transfer Foam, 2.5x2.5 (in/in) (Generic) 3 x Per Week/30 Days Chad [name], Chad [name] ([id number]) Discharge Instructions: Apply Hydrofera Blue Ready to wound bed as directed Secondary Dressing: ABD Pad 5x9 (in/in) 3 x Per Week/30 Days Discharge Instructions: Cover with ABD pad Secondary Dressing: Kerlix 4.5 x 4.1 (in/yd) 3 x Per Week/30 Days Discharge Instructions: Apply Kerlix 4.5 x 4.1 (in/yd) as instructed Secured With: 75M Medipore H Soft Cloth Surgical Tape, 2x2 (in/yd) (Generic) 3 x Per Week/30 Days Laboratory o Bacteria identified in Wound by Culture (MICRO) - non healing wound left medial ankle - (ICD10 L97.322 - Non-pressure chronic ulcer of left ankle with fat layer exposed) oooo LOINC Code: 6462-6 oooo Convenience Name: Wound culture routine Patient Medications Allergies: penicillin Notifications Medication Indication Start End Cipro 08/20/2020 DOSE 1 - oral 500 mg tablet - 1 tablet oral taken 2 times per day for 14 days. Discontinue Bactrim DS Electronic Signature(s) Signed: 08/20/2020 5:09:34 PM By: Worthy Keeler PA-C Previous Signature: 08/14/2020 5:06:45 PM Version By: Worthy Keeler PA-C Previous Signature: 08/17/2020 10:25:07 AM Version By: Carlene Coria RN Entered By: Worthy Keeler on 08/20/2020 17:02:32 Chad [name], Chad [name] ([id number]) -------------------------------------------------------------------------------- Prescription 08/14/2020 Patient Name: Chad [name] Provider: Jeri Cos PA-C Date of Birth: 18-Apr-1961 NPI#: [id number] Sex: M DEA#: [id number] Phone #: [phone] License #: Patient Address: [facility] [place], [place] [address] [address] [place], [place] [address] [phone] Allergies penicillin Medication Medication: Route: Strength: Form: Cipro oral 500 mg tablet Class: QUINOLONE ANTIBIOTICS Dose: Frequency / Time: Indication: 1 1 tablet  oral taken 2 times per day for 14 days. Discontinue Bactrim DS Number of Refills: Number of Units: 0 Twenty Eight (28) Tablet(s) Generic Substitution: Start Date: End Date: Administered at Facility: Substitution Permitted 3/81/0175 No Note to Pharmacy: Hand Signature: Date(s): Electronic Signature(s) Signed: 08/20/2020 5:09:34 PM By: Worthy Keeler PA-C Entered By: Worthy Keeler on 08/20/2020 17:02:33 Bobeck, Zerick (  [id number]) --------------------------------------------------------------------------------  Problem List Details Patient Name: Chad [name], Chad [name] Date of Service: 08/14/2020 1:15 PM Medical Record Number: [id number] Patient Account Number: [id number] Date of Birth/Sex: 11-07-1960 (60 y.o. M) Treating RN: Cornell Barman Primary Care Provider: Clayborn Bigness Other Clinician: Referring Provider: Clayborn Bigness Treating Provider/Extender: Jeri Cos Weeks in Treatment: 0 Active Problems ICD-10 Encounter Code Description Active Date MDM Diagnosis I87.2 Venous insufficiency (chronic) (peripheral) 08/14/2020 No Yes L97.322 Non-pressure chronic ulcer of left ankle with fat layer exposed 08/14/2020 No Yes L97.412 Non-pressure chronic ulcer of right heel and midfoot with fat layer 08/14/2020 No Yes exposed L97.512 Non-pressure chronic ulcer of other part of right foot with fat layer 08/14/2020 No Yes exposed I69.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 M) Treating RN: Cornell Barman Primary Care Provider: Clayborn Bigness Other Clinician: Referring Provider: Clayborn Bigness Treating Provider/Extender: Skipper Cliche in Treatment: 0 Subjective Chief Complaint Information obtained from Patient Left ankle and right foot and heel ulcers History of Present Illness (HPI) 10/08/18 on evaluation today patient actually presents to our office for initial evaluation concerning wounds that he has of the bilateral lower extremities. 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 He also has bilateral venous stasis. He apparently has been homeless currently following discharge from the hospital apparently he has been placed at [facility] which is is a skilled nursing facility locally. Nonetheless fortunately he does not show any signs of infection at this time which is good news. In fact several of the wound actually appears to be showing some signs of improvement already in my pinion. There are a couple areas in the left leg in particular there likely gonna require some sharp debridement to help clear away some necrotic tissue and help with more sufficient healing. No fevers, chills, nausea, or vomiting noted at this time. 10/15/18 on evaluation today patient actually appears to be doing very well in regard to his bilateral lower extremities. He's been tolerating the dressing changes without complication. Fortunately there does not appear to be any evidence of active infection at this time which is great news. Overall I'm actually very pleased with how this has progressed in just one visits time. Readmission: 08/14/2020 upon evaluation today patient presents for re-evaluation here in our clinic. He is having issues with his left ankle region as well as his right toe and his right heel. He tells me that  the toe and heel actually began as a area that was itching that he was scratching and then subsequently opened up into wounds. These may have been abscess areas I presume based on what I am seeing currently. With regard to his left ankle region he tells me this was a similar type occurrence although he does have venous stasis this very well may be more of a venous leg ulcer more than anything. Nonetheless I do believe that the patient would benefit from appropriate and aggressive wound care to try to help get things under better control here. He does have history of a  stroke on the left side affecting him to some degree there that he is able to stand although he does have some residual weakness. Otherwise again the patient does have chronic venous insufficiency as previously noted. His arterial studies most recently obtained showed that he had an ABI on the right of 1.16 with a TBI of 0.52 and on the left and ABI of 1.14 with a TBI of 0.81. That was obtained on 06/19/2020. Patient History Information obtained from Patient. Allergies penicillin (Severity: Moderate, Reaction: rash) Social History Current every day smoker - 1/2 pack per day, Marital Status - Single, Alcohol Use - Never, Drug Use - No History, Caffeine Use - Daily - cooffee. Medical History Eyes Denies history of Cataracts, Glaucoma, Optic Neuritis Ear/Nose/Mouth/Throat Denies history of Chronic sinus problems/congestion, Middle ear problems Hematologic/Lymphatic Patient has history of Anemia Denies history of Hemophilia, Human Immunodeficiency Virus, Lymphedema, Sickle Cell Disease Respiratory Patient has history of Chronic Obstructive Pulmonary Disease (COPD) Denies history of Aspiration, Asthma, Pneumothorax, Sleep Apnea, Tuberculosis Cardiovascular Denies history of Angina, Arrhythmia, Congestive Heart Failure, Coronary Artery Disease, Deep Vein Thrombosis, Hypertension, Hypotension, Myocardial Infarction, Peripheral  Arterial Disease, Peripheral Venous Disease, Phlebitis, Vasculitis Gastrointestinal Patient has history of Hepatitis C Denies history of Cirrhosis , Colitis, Crohn s, Hepatitis A, Hepatitis B Endocrine Denies history of Type I Diabetes, Type II Diabetes Genitourinary Denies history of End Stage Renal Disease Immunological Denies history of Lupus Erythematosus, Raynaud s, Scleroderma Integumentary (Skin) Chad [name], Chad [name] ([id number]) Denies history of History of Burn, History of pressure wounds Musculoskeletal Denies history of Gout, Rheumatoid Arthritis, Osteoarthritis, Osteomyelitis Neurologic Denies history of Dementia, Neuropathy, Quadriplegia, Paraplegia, Seizure Disorder Oncologic Patient has history of Received Chemotherapy - infusions Review of Systems (ROS) Constitutional Symptoms (General Health) Denies complaints or symptoms of Fatigue, Fever, Chills, Marked Weight Change. Eyes Denies complaints or symptoms of Dry Eyes, Vision Changes, Glasses / Contacts. Ear/Nose/Mouth/Throat Denies complaints or symptoms of Difficult clearing ears, Sinusitis. Hematologic/Lymphatic Denies complaints or symptoms of Bleeding / Clotting Disorders, Human Immunodeficiency Virus. Respiratory Denies complaints or symptoms of Chronic or frequent coughs, Shortness of Breath. Cardiovascular Denies complaints or symptoms of Chest pain, LE edema. Gastrointestinal Denies complaints or symptoms of Frequent diarrhea, Nausea, Vomiting. Endocrine Complains or has symptoms of Hepatitis - c. Denies complaints or symptoms of Thyroid disease, Polydypsia (Excessive Thirst). Genitourinary Denies complaints or symptoms of Kidney failure/ Dialysis, Incontinence/dribbling. Immunological Denies complaints or symptoms of Hives, Itching. Integumentary (Skin) Complains or has symptoms of Wounds, Bleeding or bruising tendency, Breakdown, Swelling. Musculoskeletal Denies complaints or symptoms of Muscle Pain,  Muscle Weakness. Neurologic Denies complaints or symptoms of Numbness/parasthesias, Focal/Weakness. Psychiatric Denies complaints or symptoms of Anxiety, Claustrophobia. Objective Constitutional sitting or standing blood pressure is within target range for patient.. pulse regular and within target range for patient.Marland Kitchen respirations regular, non- labored and within target range for patient.Marland Kitchen temperature within target range for patient.. Well-nourished and well-hydrated in no acute distress. Vitals Time Taken: 1:50 PM, Height: 67 in, Source: Stated, Weight: 160 lbs, Source: Stated, BMI: 25.1, Temperature: 98.2 F, Pulse: 93 bpm, Respiratory Rate: 18 breaths/min, Blood Pressure: 128/90 mmHg. Eyes conjunctiva clear no eyelid edema noted. pupils equal round and reactive to light and accommodation. Ears, Nose, Mouth, and Throat no gross abnormality of ear auricles or external auditory canals. normal hearing noted during conversation. mucus membranes moist. Respiratory normal breathing without difficulty. Cardiovascular 2+ dorsalis pedis/posterior tibialis pulses. 1+ pitting edema of the bilateral lower extremities. Musculoskeletal unsteady while walking. Psychiatric this patient is able to  make decisions and demonstrates good insight into disease process. Alert and Oriented x 3. pleasant and cooperative. General Notes: Upon inspection patient's wounds currently showed signs of some inflammation especially on the left ankle area where there does appear to potentially be infection here as well. I am concerned about that. I am going to obtain a wound culture today for further evaluation of this area. I did actually go ahead as well and place him on Bactrim DS I think this is a good option as a broad-spectrum antibiotic. The patient is REDRICK, NORDAHL ([id number]) in agreement with that plan. No sharp debridement was performed today especially in light of the discomfort that he was  experiencing. Integumentary (Hair, Skin) Wound #6 status is Open. Original cause of wound was Gradually Appeared. The wound is located on the Right Toe Second. The wound measures 1.7cm length x 0.9cm width x 0.1cm depth; 1.202cm^2 area and 0.12cm^3 volume. There is Fat Layer (Subcutaneous Tissue) exposed. There is no tunneling or undermining noted. There is a medium amount of serosanguineous drainage noted. There is small (1-33%) pink granulation within the wound bed. There is a large (67-100%) amount of necrotic tissue within the wound bed including Eschar and Adherent Slough. Wound #7 status is Open. Original cause of wound was Gradually Appeared. The wound is located on the Right Calcaneus. The wound measures 1.1cm length x 0.5cm width x 0.1cm depth; 0.432cm^2 area and 0.043cm^3 volume. There is Fat Layer (Subcutaneous Tissue) exposed. There is no tunneling or undermining noted. There is a medium amount of serosanguineous drainage noted. There is medium (34-66%) pink, pale granulation within the wound bed. There is a medium (34-66%) amount of necrotic tissue within the wound bed including Adherent Slough. Wound #8 status is Open. Original cause of wound was Gradually Appeared. The wound is located on the Left,Medial Ankle. The wound measures 3cm length x 2.5cm width x 0.1cm depth; 5.89cm^2 area and 0.589cm^3 volume. There is Fat Layer (Subcutaneous Tissue) exposed. There is no tunneling or undermining noted. There is a medium amount of serosanguineous drainage noted. There is small (1-33%) red, pink granulation within the wound bed. There is a large (67-100%) amount of necrotic tissue within the wound bed including Eschar and Adherent Slough. 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 Bathing/ Shower/ Hygiene: May shower with wound dressing protected with water repellent cover or cast protector. Edema Control - Lymphedema / Segmental Compressive Device / Other: Elevate, Exercise Daily and Avoid Standing for Long Periods of Time. Elevate legs to the level of the heart and pump ankles as often as possible Elevate leg(s) parallel to the floor when sitting. DO YOUR BEST to sleep in the bed at night. DO NOT sleep in your recliner. Long hours of sitting in a recliner leads to swelling of the legs and/or potential wounds on your backside. Laboratory ordered were: Wound culture routine - non healing wound left medial ankle The following medication(s) was prescribed: Cipro oral 500 mg tablet 1 1 tablet oral taken 2 times per day for 14 days. Discontinue Bactrim DS starting 08/20/2020 WOUND #6: - Toe Second Wound Laterality: Right Cleanser: Normal Saline (Generic) 1 x Per Day/30 Days Discharge Instructions: [facility]  your hands with soap and water. Remove old dressing, discard into plastic bag and place into trash. Cleanse the wound with Normal Saline prior to applying a clean dressing using gauze sponges, not tissues or cotton balls. Do not scrub or use excessive force. Pat dry using gauze sponges, not tissue or cotton balls. Primary Dressing: Santyl Collagenase Ointment, 30 (gm), tube (Generic) 1 x Per Day/30 Days Discharge Instructions: apply nickle thick to wound bed Secondary Dressing: Bordered Gauze Sterile-HBD 4x4 (in/in) (Generic) 1 x Per Day/30 Days Discharge Instructions: Cover wound with Bordered Guaze Sterile as directed WOUND #7: - Calcaneus Wound Laterality: Right Cleanser: Normal Saline (Generic) 1 x Per Day/30 Days Discharge Instructions: Wash your hands with soap and water.  Remove old dressing, discard into plastic bag and place into trash. Cleanse the wound with Normal Saline prior to applying a clean dressing using gauze sponges, not tissues or cotton balls. Do not scrub or use excessive force. Pat dry using gauze sponges, not tissue or cotton balls. Primary Dressing: Santyl Collagenase Ointment, 30 (gm), tube (Generic) 1 x Per Day/30 Days Discharge Instructions: apply nickle thick to wound bed Secondary Dressing: Bordered Gauze Sterile-HBD 4x4 (in/in) (Generic) 1 x Per Day/30 Days Discharge Instructions: Cover wound with Bordered Guaze Sterile as directed WOUND #8: - Ankle Wound Laterality: Left, Medial Cleanser: Normal Saline (Generic) 3 x Per Week/30 Days Discharge Instructions: Wash your hands with soap and water. Remove old dressing, discard into plastic bag and place into trash. Cleanse the wound with Normal Saline prior to applying a clean dressing using gauze sponges, not tissues or cotton balls. Do not scrub or use excessive force. Pat dry using gauze sponges, not tissue or cotton balls. Chad [name], Chad [name] ([id number]) Primary Dressing: Hydrofera Blue Ready Transfer Foam, 2.5x2.5 (in/in) (Generic) 3 x Per Week/30 Days Discharge Instructions: Apply Hydrofera Blue Ready to wound bed as directed Secondary Dressing: ABD Pad 5x9 (in/in) 3 x Per Week/30 Days Discharge Instructions: Cover with ABD pad Secondary Dressing: Kerlix 4.5 x 4.1 (in/yd) 3 x Per Week/30 Days Discharge Instructions: Apply Kerlix 4.5 x 4.1 (in/yd) as instructed Secured With: 51M Medipore H Soft Cloth Surgical Tape, 2x2 (in/yd) (Generic) 3 x Per Week/30 Days 1 would recommend currently that we actually go ahead and initiate treatment on the right at the heel and the toe ulcers with Santyl I think this is good to be the best option for him currently. 2. I am also can recommend that we have the patient go ahead and initiate treatment with a Hydrofera Blue dressing to the left ankle region  I think that is to be honest could be the best thing considering the hypergranular nature here. 3. I am going to start him on Bactrim DS I think this is a good option to begin with and will make any adjustments as we need to going forward. We will see patient back for reevaluation in 2 weeks here in the clinic. If anything worsens or changes patient will contact our office for additional recommendations. Electronic Signature(s) Signed: 08/20/2020 5:03:52 PM By: Worthy Keeler PA-C Previous Signature: 08/14/2020 3:31:28 PM Version By: Worthy Keeler PA-C Previous Signature: 08/14/2020 3:31:15 PM Version By: Worthy Keeler PA-C Entered By: Worthy Keeler on 08/20/2020 17:03:52 Chad [name] ([id number]) -------------------------------------------------------------------------------- ROS/PFSH Details Patient Name: Chad [name] Date of Service: 08/14/2020 1:15 PM Medical Record Number: [id number] Patient Account Number: [id number] Date of Birth/Sex: [birth year] (59 y.o. M) Treating RN: Carlene Coria Primary Care Provider: Clayborn Bigness Other Clinician: Referring  Provider: Clayborn Bigness Treating Provider/Extender: Skipper Cliche in Treatment: 0 Information Obtained From Patient Constitutional Symptoms (General Health) Complaints and Symptoms: Negative for: Fatigue; Fever; Chills; Marked Weight Change Eyes Complaints and Symptoms: Negative for: Dry Eyes; Vision Changes; Glasses / Contacts Medical History: Negative for: Cataracts; Glaucoma; Optic Neuritis Ear/Nose/Mouth/Throat Complaints and Symptoms: Negative for: Difficult clearing ears; Sinusitis Medical History: Negative for: Chronic sinus problems/congestion; Middle ear problems Hematologic/Lymphatic Complaints and Symptoms: Negative for: Bleeding / Clotting Disorders; Human Immunodeficiency Virus Medical History: Positive for: Anemia Negative for: Hemophilia; Human Immunodeficiency Virus; Lymphedema; Sickle Cell  Disease Respiratory Complaints and Symptoms: Negative for: Chronic or frequent coughs; Shortness of Breath Medical History: Positive for: Chronic Obstructive Pulmonary Disease (COPD) Negative for: Aspiration; Asthma; Pneumothorax; Sleep Apnea; Tuberculosis Cardiovascular Complaints and Symptoms: Negative for: Chest pain; LE edema Medical History: Negative for: Angina; Arrhythmia; Congestive Heart Failure; Coronary Artery Disease; Deep Vein Thrombosis; Hypertension; Hypotension; Myocardial Infarction; Peripheral Arterial Disease; Peripheral Venous Disease; Phlebitis; Vasculitis Gastrointestinal Complaints and Symptoms: Negative for: Frequent diarrhea; Nausea; Vomiting Medical History: Positive for: Hepatitis C Negative for: Cirrhosis ; Colitis; Crohnos; Hepatitis A; Hepatitis B Endocrine Chad [name], Chad [name] ([id number]) Complaints and Symptoms: Positive for: Hepatitis - c Negative for: Thyroid disease; Polydypsia (Excessive Thirst) Medical History: Negative for: Type I Diabetes; Type II Diabetes Genitourinary Complaints and Symptoms: Negative for: Kidney failure/ Dialysis; Incontinence/dribbling Medical History: Negative for: End Stage Renal Disease Immunological Complaints and Symptoms: Negative for: Hives; Itching Medical History: Negative for: Lupus Erythematosus; Raynaudos; Scleroderma Integumentary (Skin) Complaints and Symptoms: Positive for: Wounds; Bleeding or bruising tendency; Breakdown; Swelling Medical History: Negative for: History of Burn; History of pressure wounds Musculoskeletal Complaints and Symptoms: Negative for: Muscle Pain; Muscle Weakness Medical History: Negative for: Gout; Rheumatoid Arthritis; Osteoarthritis; Osteomyelitis Neurologic Complaints and Symptoms: Negative for: Numbness/parasthesias; Focal/Weakness Medical History: Negative for: Dementia; Neuropathy; Quadriplegia; Paraplegia; Seizure Disorder Psychiatric Complaints and  Symptoms: Negative for: Anxiety; Claustrophobia Oncologic Medical History: Positive for: Received Chemotherapy - infusions Immunizations Pneumococcal Vaccine: Received Pneumococcal Vaccination: Yes Implantable Devices None Family and Social History Current every day smoker - 1/2 pack per day; Marital Status - Single; Alcohol Use: Never; Drug Use: No History; Caffeine Use: Daily - cooffee; Financial Concerns: No; Food, Clothing or Shelter Needs: No; Support System Lacking: No; Transportation Concerns: No Chad [name], Chad [name] ([id number]) Electronic Signature(s) Signed: 08/14/2020 5:06:45 PM By: Worthy Keeler PA-C Signed: 08/17/2020 10:25:07 AM By: Carlene Coria RN Entered By: Carlene Coria on 08/14/2020 14:03:12 Chad [name] ([id number]) -------------------------------------------------------------------------------- SuperBill Details Patient Name: Chad [name] Date of Service: 08/14/2020 Medical Record Number: [id number] Patient Account Number: [id number] Date of Birth/Sex: 08-May-1961 (60 y.o. 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 Facility Procedures CPT4 Code: [id number] Description: [id number] - WOUND CARE VISIT-LEV 5 EST PT Modifier: Quantity: 1 Physician Procedures CPT4 Code: [id number] Description: 99214 - WC PHYS LEVEL 4 - EST PT Modifier: Quantity: 1 CPT4 Code: Description: ICD-10 Diagnosis Description I87.2  Venous insufficiency (chronic) (peripheral) L97.322 Non-pressure chronic ulcer of left ankle with fat layer exposed L97.412 Non-pressure chronic ulcer of right heel and midfoot with  fat layer exp L97.512  Non-pressure chronic ulcer of other part of right foot with fat layer e Modifier: osed xposed Quantity: Electronic Signature(s) Signed: 08/14/2020 5:06:45 PM By: Worthy Keeler PA-C Signed: 08/17/2020 10:25:07 AM By: Carlene Coria RN Previous Signature: 08/14/2020 3:31:54 PM Version By: Worthy Keeler PA-C Previous Signature: 08/14/2020 3:31:41 PM Version By: Worthy Keeler PA-C Entered By: Carlene Coria on 08/14/2020 16:57:23

## 2020-08-17 NOTE — Progress Notes (Signed)
SALOMON, GANSER ([id number]) Visit Report for 08/14/2020 Abuse/Suicide Risk Screen Details Patient Name: Chad [name], Chad [name] Date of Service: 08/14/2020 1:15 PM Medical Record Number: [id number] Patient Account Number: [id number] Date of Birth/Sex: 03-16-1961 (60 y.o. M) Treating RN: Carlene Coria Primary Care Etienne Mowers: Clayborn Bigness Other Clinician: Referring Daveigh Batty: Clayborn Bigness Treating Colbi Schiltz/Extender: Skipper Cliche in Treatment: 0 Abuse/Suicide Risk Screen Items Answer ABUSE RISK SCREEN: Has anyone close to you tried to hurt or harm you recentlyo No Do you feel uncomfortable with anyone in your familyo No Has anyone forced you do things that you didnot want to doo No Electronic Signature(s) Signed: 08/17/2020 10:25:07 AM By: Carlene Coria RN Entered By: Carlene Coria on 08/14/2020 14:03:17 Chad [name] ([id number]) -------------------------------------------------------------------------------- Activities of Daily Living Details Patient Name: Chad [name] Date of Service: 08/14/2020 1:15 PM Medical Record Number: [id number] Patient Account Number: [id number] Date of Birth/Sex: 1960/10/29 (60 y.o. M) Treating RN: Carlene Coria Primary Care Anala Whisenant: Clayborn Bigness Other Clinician: Referring Jawad Wiacek: Clayborn Bigness Treating Aanchal Cope/Extender: Skipper Cliche in Treatment: 0 Activities of Daily Living Items Answer Activities of Daily Living (Please select one for each item) Drive Automobile Not Able Take Medications Need Assistance Use Telephone Need Assistance Care for Appearance Need Assistance Use Toilet Need Assistance Bath / Shower Need Assistance Dress Self Need Assistance Feed Self Completely Able Walk Not Able Get In / Out Bed Need Assistance Housework Not Able Prepare Meals Not Able Handle Money Not Able Shop for Self Need Assistance Electronic Signature(s) Signed: 08/17/2020 10:25:07 AM By: Carlene Coria RN Entered By: Carlene Coria on 08/14/2020  14:03:54 Chad [name] ([id number]) -------------------------------------------------------------------------------- Education Screening Details Patient Name: Chad [name] Date of Service: 08/14/2020 1:15 PM Medical Record Number: [id number] Patient Account Number: [id number] Date of Birth/Sex: 11-16-1960 (59 y.o. M) Treating RN: Carlene Coria Primary Care Avion Patella: Clayborn Bigness Other Clinician: Referring Sheamus Hasting: Clayborn Bigness Treating Jacquiline Zurcher/Extender: Skipper Cliche in Treatment: 0 Primary Learner Assessed: Patient Learning Preferences/Education Level/Primary Language Learning Preference: Explanation Highest Education Level: High School Preferred Language: English Cognitive Barrier Language Barrier: No Translator Needed: No Memory Deficit: No Emotional Barrier: No Cultural/Religious Beliefs Affecting Medical Care: No Physical Barrier Impaired Vision: No Impaired Hearing: No Decreased Hand dexterity: No Knowledge/Comprehension Knowledge Level: Medium Comprehension Level: Medium Ability to understand written instructions: Medium Ability to understand verbal instructions: Medium Motivation Anxiety Level: Anxious Cooperation: Cooperative Education Importance: Acknowledges Need Interest in Health Problems: Asks Questions Perception: Coherent Willingness to Engage in Self-Management Medium Activities: Readiness to Engage in Self-Management Medium Activities: Electronic Signature(s) Signed: 08/17/2020 10:25:07 AM By: Carlene Coria RN Entered By: Carlene Coria on 08/14/2020 14:04:27 Chad [name] ([id number]) -------------------------------------------------------------------------------- Fall Risk Assessment Details Patient Name: Chad [name] Date of Service: 08/14/2020 1:15 PM Medical Record Number: [id number] Patient Account Number: [id number] Date of Birth/Sex: 20-Mar-1961 (59 y.o. M) Treating RN: Carlene Coria Primary Care Shilo Pauwels: Clayborn Bigness Other  Clinician: Referring Lucretia Pendley: Clayborn Bigness Treating Zaina Jenkin/Extender: Skipper Cliche in Treatment: 0 Fall Risk Assessment Items Have you had 2 or more falls in the last 12 monthso 0 No Have you had any fall that resulted in injury in the last 12 monthso 0 No FALLS RISK SCREEN History of falling - immediate or within 3 months 0 No Secondary diagnosis (Do you have 2 or more medical diagnoseso) 0 No Ambulatory aid None/bed rest/wheelchair/nurse 0 No Crutches/cane/walker 0 No Furniture 0 No Intravenous therapy Access/Saline/Heparin Lock 0 No Gait/Transferring Normal/ bed rest/ wheelchair 0 No Weak (short steps with or without shuffle, stooped but able to lift head  while walking, may 0 No seek support from furniture) Impaired (short steps with shuffle, may have difficulty arising from chair, head down, impaired 0 No balance) Mental Status Oriented to own ability 0 No Electronic Signature(s) Signed: 08/17/2020 10:25:07 AM By: Carlene Coria RN Entered By: Carlene Coria on 08/14/2020 14:04:32 Chad [name] ([id number]) -------------------------------------------------------------------------------- Foot Assessment Details Patient Name: Chad [name] Date of Service: 08/14/2020 1:15 PM Medical Record Number: [id number] Patient Account Number: [id number] Date of Birth/Sex: 05/21/1961 (59 y.o. M) Treating RN: Carlene Coria Primary Care Odaly Peri: Clayborn Bigness Other Clinician: Referring Jakeia Carreras: Clayborn Bigness Treating Brylan Seubert/Extender: Jeri Cos Weeks in Treatment: 0 Foot Assessment Items Site Locations + = Sensation present, - = Sensation absent, C = Callus, U = Ulcer R = Redness, W = Warmth, M = Maceration, PU = Pre-ulcerative lesion F = Fissure, S = Swelling, D = Dryness Assessment Right: Left: Other Deformity: No No Prior Foot Ulcer: No No Prior Amputation: No No Charcot Joint: No No Ambulatory Status: Non-ambulatory Assistance Device: Wheelchair Gait:  Administrator, arts) Signed: 08/17/2020 10:25:07 AM By: Carlene Coria RN Entered By: Carlene Coria on 08/14/2020 14:05:17 Chad [name] ([id number]) -------------------------------------------------------------------------------- Nutrition Risk Screening Details Patient Name: Chad [name] Date of Service: 08/14/2020 1:15 PM Medical Record Number: [id number] Patient Account Number: [id number] Date of Birth/Sex: 12-13-1960 (59 y.o. M) Treating RN: Carlene Coria Primary Care Sheyna Pettibone: Clayborn Bigness Other Clinician: Referring Dresean Beckel: Clayborn Bigness Treating Rolla Kedzierski/Extender: Jeri Cos Weeks in Treatment: 0 Height (in): 67 Weight (lbs): 160 Body Mass Index (BMI): 25.1 Nutrition Risk Screening Items Score Screening NUTRITION RISK SCREEN: I have an illness or condition that made me change the kind and/or amount of food I eat 0 No I eat fewer than two meals per day 0 No I eat few fruits and vegetables, or milk products 0 No I have three or more drinks of beer, liquor or wine almost every day 0 No I have tooth or mouth problems that make it hard for me to eat 0 No I don't always have enough money to buy the food I need 0 No I eat alone most of the time 0 No I take three or more different prescribed or over-the-counter drugs a day 1 Yes Without wanting to, I have lost or gained 10 pounds in the last six months 0 No I am not always physically able to shop, cook and/or feed myself 0 No Nutrition Protocols Good Risk Protocol 0 No interventions needed Moderate Risk Protocol High Risk Proctocol Risk Level: Good Risk Score: 1 Electronic Signature(s) Signed: 08/17/2020 10:25:07 AM By: Carlene Coria RN Entered By: Carlene Coria on 08/14/2020 14:04:49

## 2020-08-18 LAB — AEROBIC CULTURE W GRAM STAIN (SUPERFICIAL SPECIMEN): Gram Stain: NONE SEEN

## 2020-08-27 ENCOUNTER — Encounter: Payer: Self-pay | Admitting: Internal Medicine

## 2020-08-27 ENCOUNTER — Inpatient Hospital Stay (HOSPITAL_BASED_OUTPATIENT_CLINIC_OR_DEPARTMENT_OTHER): Payer: Medicaid Other | Admitting: Internal Medicine

## 2020-08-27 ENCOUNTER — Inpatient Hospital Stay: Payer: Medicaid Other

## 2020-08-27 ENCOUNTER — Other Ambulatory Visit: Payer: Self-pay

## 2020-08-27 DIAGNOSIS — C661 Malignant neoplasm of right ureter: Secondary | ICD-10-CM

## 2020-08-27 DIAGNOSIS — C182 Malignant neoplasm of ascending colon: Secondary | ICD-10-CM

## 2020-08-27 DIAGNOSIS — Z5112 Encounter for antineoplastic immunotherapy: Secondary | ICD-10-CM | POA: Diagnosis not present

## 2020-08-27 LAB — CBC WITH DIFFERENTIAL/PLATELET
Abs Immature Granulocytes: 0.03 10*3/uL (ref 0.00–0.07)
Basophils Absolute: 0.1 10*3/uL (ref 0.0–0.1)
Basophils Relative: 1 %
Eosinophils Absolute: 0.3 10*3/uL (ref 0.0–0.5)
Eosinophils Relative: 3 %
HCT: 37.5 % — ABNORMAL LOW (ref 39.0–52.0)
Hemoglobin: 12.1 g/dL — ABNORMAL LOW (ref 13.0–17.0)
Immature Granulocytes: 0 %
Lymphocytes Relative: 13 %
Lymphs Abs: 1.4 10*3/uL (ref 0.7–4.0)
MCH: 24 pg — ABNORMAL LOW (ref 26.0–34.0)
MCHC: 32.3 g/dL (ref 30.0–36.0)
MCV: 74.3 fL — ABNORMAL LOW (ref 80.0–100.0)
Monocytes Absolute: 0.9 10*3/uL (ref 0.1–1.0)
Monocytes Relative: 9 %
Neutro Abs: 7.7 10*3/uL (ref 1.7–7.7)
Neutrophils Relative %: 74 %
Platelets: 265 10*3/uL (ref 150–400)
RBC: 5.05 MIL/uL (ref 4.22–5.81)
RDW: 16 % — ABNORMAL HIGH (ref 11.5–15.5)
WBC: 10.3 10*3/uL (ref 4.0–10.5)
nRBC: 0 % (ref 0.0–0.2)

## 2020-08-27 LAB — COMPREHENSIVE METABOLIC PANEL
ALT: 18 U/L (ref 0–44)
AST: 29 U/L (ref 15–41)
Albumin: 3.5 g/dL (ref 3.5–5.0)
Alkaline Phosphatase: 88 U/L (ref 38–126)
Anion gap: 8 (ref 5–15)
BUN: 16 mg/dL (ref 8–23)
CO2: 22 mmol/L (ref 22–32)
Calcium: 8.6 mg/dL — ABNORMAL LOW (ref 8.9–10.3)
Chloride: 102 mmol/L (ref 98–111)
Creatinine, Ser: 0.97 mg/dL (ref 0.61–1.24)
GFR, Estimated: >60 mL/min (ref >60)
Glucose, Bld: 120 mg/dL — ABNORMAL HIGH (ref 70–99)
Potassium: 3.8 mmol/L (ref 3.5–5.1)
Sodium: 132 mmol/L — ABNORMAL LOW (ref 135–145)
Total Bilirubin: 0.7 mg/dL (ref 0.3–1.2)
Total Protein: 8.1 g/dL (ref 6.5–8.1)

## 2020-08-27 MED ORDER — HEPARIN SOD (PORK) LOCK FLUSH 100 UNIT/ML IV SOLN
500.0000 [IU] | Freq: Once | INTRAVENOUS | Status: AC
  Administered 2020-08-27: 500 [IU]
  Filled 2020-08-27: qty 5

## 2020-08-27 NOTE — Assessment & Plan Note (Addendum)
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 JAN 5th, 2022- CT findings worrisome for progressive right renal pelvis and ureteral tumor. There is also progressive right renal cortical thinning, decreased perfusion and hydronephrosis; and mass suspicious for colonic neoplasm involving the descending colon sigmoid colon junction region.  Mild soft tissue thickening around the distal right ureter could reflect ureteral tumor-see discussion below. currently awaiting a PET scan.    # HOLD keytruda today; Labs today reviewed;  Given wound healing issues. Await PET scan and biopsy consideration-for NGS.  Otherwise patient will be candidate for antibody drug conjugate therapy. Will discuss at tumor conference.  #Descending colon mass/intermittent constipation-no evidence of obstruction.-history of Lynch syndrome.  Await PET scan patient on 2/04; will need repeat colonoscopy. Discussed with Dr.Anna; appt on 02/01.   # Iron deficiency anemia-hemoglobin 12.0- STABLE.    # Bilateral LE ulcers-s/p wound care evaluation; s/p PTCA with Dr.Dew. bil Arterial Dopp-wnl. Continue wound care/antibiotics.    #DISPOSITION: # Meyer Russel today; de-acccesed # follow up in 3 weeks-;MD labs-cbc/cmp;  Keytruda Dr.B

## 2020-08-27 NOTE — Progress Notes (Signed)
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  Is alone.  Chad [name] 60 y.o.  male above history of stage IV-ureteral cancer/with multiple co-morbidities currently on Keytruda is here for follow-up.    Of note patient was noted to have progressive disease on recent CT scan 3 weeks ago. Patient's PET scan was also rescheduled; is due for later in this week.  Patient states that he has been diagnosed with repeated wound infection of his left lower extremity.  Is currently on antibiotics as per wound care.  Patient appointment with GI is in March.  Denies any blood in stools or black-colored stools.  Denies any abdominal distention.  Review of Systems  Constitutional: Positive for malaise/fatigue. Negative for chills, diaphoresis, fever and weight loss.  HENT: Negative for nosebleeds and sore throat.   Eyes: Negative for double vision.  Respiratory: Positive for cough and shortness of breath. Negative for  hemoptysis and wheezing.   Cardiovascular: Negative for chest pain, palpitations and orthopnea.  Gastrointestinal: Positive for constipation. Negative for abdominal pain, blood in stool, diarrhea, heartburn, melena, nausea and vomiting.  Genitourinary: Negative for dysuria, frequency and urgency.  Musculoskeletal: Positive for back pain and joint pain.  Skin: Negative.  Negative for itching and rash.  Neurological: Positive for focal weakness. Negative for dizziness, tingling, weakness and headaches.       Chronic left-sided weakness upper than lower extremity.  Endo/Heme/Allergies: Does not bruise/bleed easily.  Psychiatric/Behavioral: Negative for depression. The patient is not nervous/anxious and does not have insomnia.      MEDICAL HISTORY:  Past Medical History:  Diagnosis Date  . Anemia   . Anxiety   . ARF (acute respiratory failure) (Chad [name])   . Bladder cancer ([place])   . COPD (chronic obstructive pulmonary disease) ([place])   . Depression   . Dysphagia   . Family history of colon cancer   . Family history of kidney cancer   . Family history of leukemia   . Family history of prostate cancer   . GERD (gastroesophageal reflux disease)   . Hepatitis    chronic hep c  . Hydronephrosis   . Hydronephrosis with ureteral stricture   . Hyperlipidemia   . Knee pain    Left  . Malignant neoplasm of colon (Chad [name])   . Nerve pain   . Peripheral vascular disease ([place])   . Prostate cancer (Chad [name])   . Stroke (Chad [name])   . Urinary frequency   . Venous hypertension of both lower  extremities     SURGICAL HISTORY: Past Surgical History:  Procedure Laterality Date  . COLON SURGERY     En bloc extended right hemicolectomy 07/2017  . CYSTOSCOPY W/ RETROGRADES Right 08/30/2018   Procedure: CYSTOSCOPY WITH RETROGRADE PYELOGRAM;  Surgeon: Chad Espy, MD;  Location: [facility];  Service: Urology;  Laterality: Right;  . CYSTOSCOPY WITH STENT PLACEMENT Right 04/25/2018   Procedure: CYSTOSCOPY WITH STENT  PLACEMENT;  Surgeon: Chad Espy, MD;  Location: [facility];  Service: Urology;  Laterality: Right;  . CYSTOSCOPY WITH STENT PLACEMENT Right 08/30/2018   Procedure: [place] WITH STENT Exchange;  Surgeon: Chad Espy, MD;  Location: [facility];  Service: Urology;  Laterality: Right;  . CYSTOSCOPY WITH STENT PLACEMENT Right 03/07/2019   Procedure: CYSTOSCOPY WITH STENT Exchange;  Surgeon: Chad Espy, MD;  Location: [facility];  Service: Urology;  Laterality: Right;  . CYSTOSCOPY WITH STENT PLACEMENT Right 11/21/2019   Procedure: CYSTOSCOPY WITH STENT Exchange;  Surgeon: Chad Espy, MD;  Location: [facility];  Service: Urology;  Laterality: Right;  . LOWER EXTREMITY ANGIOGRAPHY Left 05/23/2019   Procedure: LOWER EXTREMITY ANGIOGRAPHY;  Surgeon: Chad Huxley, MD;  Location: [place] [facility];  Service: Cardiovascular;  Laterality: Left;  . LOWER EXTREMITY ANGIOGRAPHY Right 05/30/2019   Procedure: LOWER EXTREMITY ANGIOGRAPHY;  Surgeon: Chad Huxley, MD;  Location: [facility];  Service: Cardiovascular;  Laterality: Right;  . LOWER EXTREMITY ANGIOGRAPHY Right 02/13/2020   Procedure: LOWER EXTREMITY ANGIOGRAPHY;  Surgeon: Chad Huxley, MD;  Location: [place];  Service: Cardiovascular;  Laterality: Right;  . LOWER EXTREMITY ANGIOGRAPHY Left 02/20/2020   Procedure: LOWER EXTREMITY ANGIOGRAPHY;  Surgeon: Chad Huxley, MD;  Location: [place] [facility];  Service: Cardiovascular;  Laterality: Left;  . PORTA CATH INSERTION N/A 02/28/2019   Procedure: PORTA CATH INSERTION;  Surgeon: Chad Huxley, MD;  Location: [facility];  Service: Cardiovascular;  Laterality: N/A;  . tumor removed       SOCIAL HISTORY: Social History   Socioeconomic History  . Marital status: Single    Spouse name: Not on file  . Number of children: Not on file  . Years of education: Not on file  . Highest education level: Not on file  Occupational History  . Not on file  Tobacco Use  .  Smoking status: Current Every Day Smoker    Packs/day: 1.00    Types: Cigarettes  . Smokeless tobacco: Never Used  Vaping Use  . Vaping Use: Never used  Substance and Sexual Activity  . Alcohol use: Not Currently  . Drug use: Not Currently  . Sexual activity: Not Currently  Other Topics Concern  . Not on file  Social History Narrative    used to live [place]; moved  To [place]- end of April 2019; in Nursing home; 1pp/day; quit alcohol. 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        Family- dad- prostate ca [at 70y]; brother- 45 died of prostate cancer; brother- 79- no cancers [[place]]; sonGerald Stabs [[place]];[name]-32y prostate ca [facility] [place]]; daughter- 18 [[place]]; another daughter 53 [[place]/addict]. will refer genetics counseling. Given MSI- abnormal; highly suspicious of Lynch syndrome.  Patient's son [name] aware of high possible lynch syndrome.   Social Determinants of Health   Financial Resource Strain: Not on file  Food Insecurity: Not on file  Transportation Needs: Not on file  Physical Activity: Not on file  Stress: Not on file  Social Connections: Not on  file  Intimate Partner Violence: Not on file    FAMILY HISTORY: Family History  Problem Relation Age of Onset  . Prostate cancer Father 71  . Cancer Brother 80       unsure type  . Cancer Paternal Uncle        unsure type  . Cancer Maternal Grandmother        unsure type  . Cancer Paternal Grandmother        unsure type  . Kidney cancer Paternal Grandfather   . Cancer Other        unsure types  . Leukemia Son   . Cancer Son        other cancers, possibly colon    ALLERGIES:  is allergic to penicillins.  MEDICATIONS:  Current Outpatient Medications  Medication Sig Dispense Refill  . acetaminophen (TYLENOL) 325 MG tablet Take 650 mg by mouth 3 (three) times daily.     Marland Kitchen aspirin EC 81 MG tablet Take 1 tablet (81 mg total) by mouth daily. 150 tablet 2  . atorvastatin (LIPITOR) 10 MG tablet Take 1 tablet (10 mg total) by mouth  daily. 30 tablet 11  . Cholecalciferol (VITAMIN D) 50 MCG (2000 UT) CAPS Take by mouth.    . ciprofloxacin (CIPRO) 500 MG tablet Take 500 mg by mouth 2 (two) times daily.    . clopidogrel (PLAVIX) 75 MG tablet Take 1 tablet (75 mg total) by mouth daily. 30 tablet 11  . cyclobenzaprine (FLEXERIL) 10 MG tablet Take 10 mg by mouth at bedtime.    . diclofenac sodium (VOLTAREN) 1 % GEL Apply 2 g topically See admin instructions. Apply 2 g topically 4 times daily. Apply 2 g to left shoulder, apply 2 g to left hip and 2 g to left knee 4 times daily per [facility] instructions    . escitalopram (LEXAPRO) 5 MG tablet Take 5 mg by mouth daily.    Marland Kitchen gabapentin (NEURONTIN) 100 MG capsule Take 100 mg by mouth 3 (three) times daily.    . hydroxypropyl methylcellulose / hypromellose (ISOPTO TEARS / GONIOVISC) 2.5 % ophthalmic solution Place 1 drop into both eyes every 12 (twelve) hours.    . mirtazapine (REMERON) 7.5 MG tablet Take 7.5 mg by mouth at bedtime.    . Oxycodone HCl 10 MG TABS Take 10 mg by mouth every 6 (six) hours as needed for pain.    . pantoprazole (PROTONIX) 40 MG tablet Take 40 mg by mouth daily.    . Pollen Extracts (PROSTAT PO) Take 30 mLs by mouth.    . RESTORE CALCIUM ALGINATE EX Apply topically. Note: Dressing located on left ankle. Facility cleaning wound "every day shift and applying wound cleanser and appication of calcium Alginate and cover with dry dressing and wrapped with Kerlix"     No current facility-administered medications for this visit.      Marland Kitchen  PHYSICAL EXAMINATION: ECOG PERFORMANCE STATUS: 1 - Symptomatic but completely ambulatory  Vitals:   08/27/20 0959  BP: 96/82  Pulse: 92  Resp: 18  Temp: (!) 97.5 F (36.4 C)   Filed Weights   08/27/20 0959  Weight: 163 lb (73.9 kg)    Physical Exam Constitutional:      Comments: In wheelchair. Cachectic.  Appears older than his stated age.  Alone.  HENT:     Head: Normocephalic and atraumatic.     Mouth/Throat:      Pharynx: No oropharyngeal exudate.  Eyes:     Pupils: Pupils  are equal, round, and reactive to light.  Cardiovascular:     Rate and Rhythm: Normal rate and regular rhythm.  Pulmonary:     Effort: No respiratory distress.     Breath sounds: No wheezing.  Abdominal:     General: Bowel sounds are normal. There is no distension.     Palpations: Abdomen is soft. There is no mass.     Tenderness: There is no abdominal tenderness. There is no guarding or rebound.  Musculoskeletal:        General: No tenderness. Normal range of motion.     Cervical back: Normal range of motion and neck supple.  Skin:    General: Skin is warm.     Comments: Bilateral lower extremity ulcerations noted.  Pulses intact bilaterally.  Neurological:     Mental Status: He is oriented to person, place, and time.     Comments: Sleepy but easily arousable.  Chronic weakness left upper and lower extremity.  Psychiatric:        Mood and Affect: Affect normal.      LABORATORY DATA:  I have reviewed the data as listed Lab Results  Component Value Date   WBC 10.3 08/27/2020   HGB 12.1 (L) 08/27/2020   HCT 37.5 (L) 08/27/2020   MCV 74.3 (L) 08/27/2020   PLT 265 08/27/2020   Recent Labs    03/08/20 0905 03/29/20 0824 04/19/20 0858 05/10/20 0915 07/13/20 0811 08/06/20 0832 08/27/20 0946  NA 137 139 134*   < > 138 136 132*  K 4.4 4.1 3.8   < > 4.4 4.5 3.8  CL 103 104 101   < > 105 103 102  CO2 '25 24 26   ' < > 24 21* 22  GLUCOSE 88 94 140*   < > 93 92 120*  BUN '18 21 19   ' < > '19 19 16  ' CREATININE 0.85 0.76 0.92   < > 0.85 0.91 0.97  CALCIUM 8.8* 8.6* 8.4*   < > 8.8* 8.6* 8.6*  GFRNONAA >60 >60 >60   < > >60 >60 >60  GFRAA >60 >60 >60  --   --   --   --   PROT 8.0 7.7 7.8   < > 8.0 8.0 8.1  ALBUMIN 3.8 3.6 3.7   < > 3.5 3.4* 3.5  AST 32 28 33   < > 27 32 29  ALT '26 20 24   ' < > '16 18 18  ' ALKPHOS 127* 118 115   < > 95 99 88  BILITOT 0.6 0.4 0.4   < > 0.5 0.6 0.7   < > = values in this interval not  displayed.    RADIOGRAPHIC STUDIES: I have personally reviewed the radiological images as listed and agreed with the findings in the report. CT CHEST ABDOMEN PELVIS W CONTRAST  Result Date: 08/01/2020 CLINICAL DATA:  Restaging urologic neoplasm. History of right ureteral neoplasm. Patient undergoing immunotherapy with Keytruda. EXAM: CT CHEST, ABDOMEN, AND PELVIS WITH CONTRAST TECHNIQUE: Multidetector CT imaging of the chest, abdomen and pelvis was performed following the standard protocol during bolus administration of intravenous contrast. CONTRAST:  123m OMNIPAQUE IOHEXOL 300 MG/ML  SOLN COMPARISON:  Abdominal/pelvic CT scan 04/17/2020 and chest CT 12/09/2019. FINDINGS: CT CHEST FINDINGS Cardiovascular: The heart is normal in size. No pericardial effusion. The aorta is normal in caliber. No dissection. Stable coronary artery calcifications. The right IJ power port is in good position. No complicating features. Mediastinum/Nodes: No mediastinal hilar mass  or adenopathy. Small scattered lymph nodes are stable. The esophagus is grossly normal. Lungs/Pleura: Dependent bibasilar subpleural atelectasis but no infiltrates or effusions. Exam is somewhat limited by breathing motion artifact. No worrisome pulmonary nodules to suggest pulmonary metastatic disease. No pleural effusions or pleural nodules. Musculoskeletal: No chest wall mass, supraclavicular or axillary adenopathy. Small scattered axillary lymph nodes are stable bilaterally. The bony thorax is intact. No findings suspicious for osseous metastatic disease. CT ABDOMEN PELVIS FINDINGS Hepatobiliary: No hepatic lesions or intrahepatic biliary dilatation. Gallbladder is unremarkable. No common bile duct dilatation. Pancreas: No mass, inflammation or ductal dilatation. Stable mild pancreatic atrophy. Spleen: Stable borderline splenic enlargement. No splenic lesions. Adrenals/Urinary Tract: The adrenal glands are normal in stable. The left kidney is  unremarkable. There is a stable left renal cyst. No worrisome renal lesions. The left ureter is unremarkable. The right kidney demonstrates stable chronic hydronephrosis despite the double-J ureteral stent. There is associated moderate renal cortical thinning which appears progressive. Stable diffuse soft tissue thickening and enhancement involving the upper right ureter extending into the renal pelvis. Findings consistent with known ureteral cancer. The upper ureteral soft tissue thickening appears progressive with the ureter measuring a maximum of 16 mm on image 80/2. This measured a maximum of 12 mm on the prior CT scan. Findings worrisome for progressive tumor. There is also vague soft tissue thickening around the distal right ureter/UVJ region which could also represent tumor. No obvious bladder mass. Median lobe hypertrophy of the prostate gland is noted impressing on the base of the bladder. Stomach/Bowel: The stomach, duodenum and small bowel are unremarkable. Asymmetric colonic wall thickening involving the descending colon upper sigmoid colon junction region. This was less apparent on the study from 12/09/2019 and 04/17/2020. In retrospect I think this area was hypermetabolic on the prior PET-CT from 2019. Could not exclude colonic neoplasm. Recommend correlation with colonoscopy. Vascular/Lymphatic: Stable age advanced atherosclerotic calcifications involving the aorta and branch vessels. No aneurysm or dissection. Stable small scattered upper abdominal lymph nodes. Small scattered stable retroperitoneal lymph nodes. Numerous bilateral pelvic lymph nodes are also stable. No new or progressive findings are identified. Small inguinal lymph nodes are also stable. Reproductive: Mild prostate gland enlargement with median lobe hypertrophy impressing on the base of the bladder. The seminal vesicles appear normal. Other: No free abdominal or free pelvic fluid collections. Musculoskeletal: No significant bony  findings. No findings suspicious for metastatic bone disease. Stable age advanced degenerative changes involving both hips. IMPRESSION: 1. CT findings worrisome for progressive right renal pelvis and ureteral tumor. There is also progressive right renal cortical thinning, decreased perfusion and hydronephrosis. 2. Overall stable pelvic lymphadenopathy. No new or progressive findings. 3. Findings suspicious for colonic neoplasm involving the descending colon sigmoid colon junction region. Recommend colonoscopy. 4. Mild soft tissue thickening around the distal right ureter could reflect ureteral tumor. Aortic Atherosclerosis (ICD10-I70.0). Electronically Signed   By: Marijo Sanes M.D.   On: 08/01/2020 16:31    ASSESSMENT & PLAN:   Ureteral cancer, right ([place]) # High-grade urothelial cancer/cytology; likely of the right renal pelvis /upper ureter. JAN 5th, 2022- CT findings worrisome for progressive right renal pelvis and ureteral tumor. There is also progressive right renal cortical thinning, decreased perfusion and hydronephrosis; and mass suspicious for colonic neoplasm involving the descending colon sigmoid colon junction region.  Mild soft tissue thickening around the distal right ureter could reflect ureteral tumor-see discussion below. currently awaiting a PET scan.    # HOLD keytruda today; Labs today reviewed;  Given wound healing issues. Await PET scan and biopsy consideration-for NGS.  Otherwise patient will be candidate for antibody drug conjugate therapy. Will discuss at tumor conference.  #Descending colon mass/intermittent constipation-no evidence of obstruction.-history of Lynch syndrome.  Await PET scan patient on 2/04; will need repeat colonoscopy. Discussed with Dr.Anna; appt on 02/01.   # Iron deficiency anemia-hemoglobin 12.0- STABLE.    # Bilateral LE ulcers-s/p wound care evaluation; s/p PTCA with Dr.Dew. bil Arterial Dopp-wnl. Continue wound care/antibiotics.    #DISPOSITION: #  Meyer Russel today; de-acccesed # follow up in 3 weeks-;MD labs-cbc/cmp;  Keytruda Dr.B    All questions were answered. The patient knows to call the clinic with any problems, questions or concerns.    Cammie Sickle, MD 08/27/2020 11:17 AM

## 2020-08-28 ENCOUNTER — Encounter: Payer: Self-pay | Admitting: Gastroenterology

## 2020-08-28 ENCOUNTER — Ambulatory Visit (INDEPENDENT_AMBULATORY_CARE_PROVIDER_SITE_OTHER): Payer: Medicaid Other | Admitting: Gastroenterology

## 2020-08-28 ENCOUNTER — Other Ambulatory Visit: Payer: Self-pay

## 2020-08-28 ENCOUNTER — Encounter: Payer: Medicaid Other | Attending: Physician Assistant | Admitting: Physician Assistant

## 2020-08-28 VITALS — BP 125/86 | HR 88 | Ht 69.0 in | Wt 164.0 lb

## 2020-08-28 DIAGNOSIS — R9389 Abnormal findings on diagnostic imaging of other specified body structures: Secondary | ICD-10-CM | POA: Diagnosis not present

## 2020-08-28 DIAGNOSIS — L97412 Non-pressure chronic ulcer of right heel and midfoot with fat layer exposed: Secondary | ICD-10-CM | POA: Insufficient documentation

## 2020-08-28 DIAGNOSIS — I872 Venous insufficiency (chronic) (peripheral): Secondary | ICD-10-CM | POA: Diagnosis not present

## 2020-08-28 DIAGNOSIS — I69354 Hemiplegia and hemiparesis following cerebral infarction affecting left non-dominant side: Secondary | ICD-10-CM | POA: Diagnosis not present

## 2020-08-28 DIAGNOSIS — L299 Pruritus, unspecified: Secondary | ICD-10-CM | POA: Diagnosis not present

## 2020-08-28 DIAGNOSIS — L97512 Non-pressure chronic ulcer of other part of right foot with fat layer exposed: Secondary | ICD-10-CM | POA: Insufficient documentation

## 2020-08-28 DIAGNOSIS — L97322 Non-pressure chronic ulcer of left ankle with fat layer exposed: Secondary | ICD-10-CM | POA: Diagnosis not present

## 2020-08-28 MED ORDER — NA SULFATE-K SULFATE-MG SULF 17.5-3.13-1.6 GM/177ML PO SOLN: 1.0000 | Freq: Once | ORAL | 0 refills | Status: AC

## 2020-08-28 NOTE — Progress Notes (Addendum)
WISE, FEES ([id number]) Visit Report for 08/28/2020 Chief Complaint Document Details Patient Name: Chad [name], Chad [name] Date of Service: 08/28/2020 9:00 AM Medical Record Number: [id number] Patient Account Number: [id number] Date of Birth/Sex: 05/23/1961 (60 y.o. M) Treating RN: Dolan Amen Primary Care Provider: Clayborn Bigness Other Clinician: Referring Provider: Clayborn Bigness Treating Provider/Extender: Skipper Cliche in Treatment: 2 Information Obtained from: Patient Chief Complaint Left ankle and right foot and heel ulcers Electronic Signature(s) Signed: 08/28/2020 9:29:19 AM By: Worthy Keeler PA-C Entered By: Worthy Keeler on 08/28/2020 09:29:18 Chad [name] ([id number]) -------------------------------------------------------------------------------- HPI Details Patient Name: Chad [name] Date of Service: 08/28/2020 9:00 AM Medical Record Number: [id number] Patient Account Number: [id number] Date of Birth/Sex: 03-01-1961 (59 y.o. M) Treating RN: Dolan Amen Primary Care Provider: Clayborn Bigness Other Clinician: Referring Provider: Clayborn Bigness Treating Provider/Extender: Skipper Cliche in Treatment: 2 History of Present Illness HPI Description: 10/08/18 on evaluation today patient actually presents to our office for initial evaluation concerning wounds that he has of the bilateral lower extremities. 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 He also has bilateral venous stasis. He apparently has been homeless currently following discharge from the hospital apparently he has been placed at [facility] which is is a skilled nursing facility locally. Nonetheless fortunately he does not show any signs of infection at this time which is good news. In fact several of the wound actually appears to be showing some signs of  improvement already in my pinion. There are a couple areas in the left leg in particular there likely gonna require some sharp debridement to help clear away some necrotic tissue and help with more sufficient healing. No fevers, chills, nausea, or vomiting noted at this time. 10/15/18 on evaluation today patient actually appears to be doing very well in regard to his bilateral lower extremities. He's been tolerating the dressing changes without complication. Fortunately there does not appear to be any evidence of active infection at this time which is great news. Overall I'm actually very pleased with how this has progressed in just one visits time. Readmission: 08/14/2020 upon evaluation today patient presents for re-evaluation here in our clinic. He is having issues with his left ankle region as well as his right toe and his right heel. He tells me that the toe and heel actually began as a area that was itching that he was scratching and then subsequently opened up into wounds. These may have been abscess areas I presume based on what I am seeing currently. With regard to his left ankle region he tells me this was a similar type occurrence although he does have venous stasis this very well may be more of a venous leg ulcer more than anything. Nonetheless I do believe that the patient would benefit from appropriate and aggressive wound care to try to help get things under better control here. He does have history of a stroke on the left side affecting him to some degree there that he is able to stand although he does have some residual weakness. Otherwise again the patient does have chronic venous insufficiency as previously noted. His arterial studies most recently obtained showed that he had an ABI on the right of 1.16 with a TBI of 0.52 and on the left and ABI of 1.14 with a TBI of 0.81. That was obtained on 06/19/2020. 08/28/2020 upon evaluation today  patient appears to be doing decently well in  regard to his wounds in general. He has been tolerating the dressing changes without complication. Fortunately there does not appear to be any signs of active infection which is great news. With that being said I think the [facility] is doing a good job I would recommend that we likely continue with that currently. Electronic Signature(s) Signed: 08/28/2020 9:35:08 AM By: Worthy Keeler PA-C Entered By: Worthy Keeler on 08/28/2020 09:35:08 Chad [name] ([id number]) -------------------------------------------------------------------------------- Physical Exam Details Patient Name: Chad [name] Date of Service: 08/28/2020 9:00 AM Medical Record Number: [id number] Patient Account Number: [id number] Date of Birth/Sex: 09-28-1960 (59 y.o. M) Treating RN: Dolan Amen Primary Care Provider: Clayborn Bigness Other Clinician: Referring Provider: Clayborn Bigness Treating Provider/Extender: Jeri Cos Weeks in Treatment: 2 Constitutional Well-nourished and well-hydrated in no acute distress. Respiratory normal breathing without difficulty. Psychiatric this patient is able to make decisions and demonstrates good insight into disease process. Alert and Oriented x 3. pleasant and cooperative. Notes Upon inspection patient's wound bed showed signs of good granulation epithelization at this point. There does not appear to be any evidence of active infection which is great news and overall very pleased with where things stand today. No fevers, chills, nausea, vomiting, or diarrhea. Electronic Signature(s) Signed: 08/28/2020 9:35:24 AM By: Worthy Keeler PA-C Entered By: Worthy Keeler on 08/28/2020 09:35:24 Chad [name] ([id number]) -------------------------------------------------------------------------------- Physician Orders Details Patient Name: Chad [name] Date of Service: 08/28/2020 9:00 AM Medical Record Number: [id number] Patient Account Number: [id number] Date of Birth/Sex:  1960/10/29 (59 y.o. 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 Bathing/ Shower/ Hygiene o May shower with wound dressing protected with water repellent cover or cast protector. Edema Control - Lymphedema / Segmental Compressive Device / Other o Elevate, Exercise Daily and Avoid Standing for Long Periods of Time. o Elevate legs to the level of the heart and pump ankles as often as possible o Elevate leg(s) parallel to the floor when sitting. o DO YOUR BEST to sleep in the bed at night. DO NOT sleep in your recliner. Long hours of sitting in a recliner leads to swelling of the legs and/or potential wounds on your backside. Wound Treatment Wound #6 - Toe Second Wound Laterality: Right Cleanser: Normal Saline 3 x Per Week/30 Days Discharge Instructions: Wash your hands with soap and water. Remove old dressing, discard into plastic bag and place into trash. Cleanse the wound with Normal Saline prior to applying a clean dressing using gauze sponges, not tissues or cotton balls. Do not scrub or use excessive force. Pat dry using gauze sponges, not tissue or cotton balls. Primary Dressing: Hydrofera Blue Ready Transfer Foam, 2.5x2.5 (in/in) (Generic) 3 x Per Week/30 Days Discharge Instructions: Apply Hydrofera Blue Ready to wound bed as  directed Secondary Dressing: Bordered Gauze Sterile-HBD 4x4 (in/in) (Generic) 3 x Per Week/30 Days Discharge Instructions: Cover wound with Bordered Guaze Sterile as directed Wound #7 - Calcaneus Wound Laterality: Right  Cleanser: Normal Saline 3 x Per Week/30 Days Discharge Instructions: Wash your hands with soap and water. Remove old dressing, discard into plastic bag and place into trash. Cleanse the wound with Normal Saline prior to applying a clean dressing using gauze sponges, not tissues or cotton balls. Do not scrub or use excessive force. Pat dry using gauze sponges, not tissue or cotton balls. Primary Dressing: Hydrofera Blue Ready Transfer Foam, 2.5x2.5 (in/in) 3 x Per Week/30 Days Discharge Instructions: Apply Hydrofera Blue Ready to wound bed as directed Secondary Dressing: Bordered Gauze Sterile-HBD 4x4 (in/in) (Generic) 3 x Per Week/30 Days Discharge Instructions: Cover wound with Bordered Guaze Sterile as directed Wound #8 - Ankle Wound Laterality: Left, Medial Cleanser: Normal Saline (Generic) 3 x Per Week/30 Days Discharge Instructions: Wash your hands with soap and water. Remove old dressing, discard into plastic bag and place into trash. Cleanse the wound with Normal Saline prior to applying a clean dressing using gauze sponges, not tissues or cotton balls. Do not scrub or use excessive force. Pat dry using gauze sponges, not tissue or cotton balls. Primary Dressing: Hydrofera Blue Ready Transfer Foam, 2.5x2.5 (in/in) (Generic) 3 x Per Week/30 Days DAMANTE, TREWIN ([id number]) Discharge Instructions: Apply Hydrofera Blue Ready to wound bed as directed Secondary Dressing: ABD Pad 5x9 (in/in) 3 x Per Week/30 Days Discharge Instructions: Cover with ABD pad Secondary Dressing: Kerlix 4.5 x 4.1 (in/yd) 3 x Per Week/30 Days Discharge Instructions: Apply Kerlix 4.5 x 4.1 (in/yd) as instructed Secured With: 82M Medipore H Soft Cloth Surgical Tape, 2x2 (in/yd) (Generic) 3 x Per  Week/30 Days Electronic Signature(s) Signed: 08/28/2020 11:59:19 AM By: Georges Mouse, Minus Breeding RN Signed: 08/28/2020 9:50:44 PM By: Worthy Keeler PA-C Entered By: Georges Mouse, Minus Breeding on 08/28/2020 09:35:32 Chad [name] ([id number]) -------------------------------------------------------------------------------- Problem List Details Patient Name: Chad [name] Date of Service: 08/28/2020 9:00 AM Medical Record Number: [id number] Patient Account Number: [id number] Date of Birth/Sex: Mar 09, 1961 (60 y.o. M) Treating RN: Dolan Amen Primary Care Provider: Clayborn Bigness Other Clinician: Referring Provider: Clayborn Bigness Treating Provider/Extender: Skipper Cliche in Treatment: 2 Active Problems ICD-10 Encounter Code Description Active Date MDM Diagnosis I87.2 Venous insufficiency (chronic) (peripheral) 08/14/2020 No Yes L97.322 Non-pressure chronic ulcer of left ankle with fat layer exposed 08/14/2020 No Yes L97.412 Non-pressure chronic ulcer of right heel and midfoot with fat layer 08/14/2020 No Yes exposed L97.512 Non-pressure chronic ulcer of other part of right foot with fat layer 08/14/2020 No Yes exposed I69.354 Hemiplegia and hemiparesis following cerebral infarction affecting left 08/14/2020 No Yes non-dominant side Inactive Problems Resolved Problems Electronic Signature(s) Signed: 08/28/2020 9:29:12 AM By: Worthy Keeler PA-C Entered By: Worthy Keeler on 08/28/2020 09:29:12 Chad [name] ([id number]) -------------------------------------------------------------------------------- Progress Note Details Patient Name: Chad [name] Date of Service: 08/28/2020 9:00 AM Medical Record Number: [id number] Patient Account Number: [id number] Date of Birth/Sex: 07/31/1960 (59 y.o. M) Treating RN: Dolan Amen Primary Care Provider: Clayborn Bigness Other Clinician: Referring Provider: Clayborn Bigness Treating Provider/Extender: Skipper Cliche in Treatment: 2 Subjective Chief  Complaint Information obtained from Patient Left ankle and right foot and heel ulcers History of Present Illness (HPI) 10/08/18 on evaluation today patient actually presents to our office for initial evaluation concerning wounds that he has of the bilateral lower extremities. 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 He also has bilateral venous stasis. He apparently has been homeless  currently following discharge from the hospital apparently he has been placed at [facility] which is is a skilled nursing facility locally. Nonetheless fortunately he does not show any signs of infection at this time which is good news. In fact several of the wound actually appears to be showing some signs of improvement already in my pinion. There are a couple areas in the left leg in particular there likely gonna require some sharp debridement to help clear away some necrotic tissue and help with more sufficient healing. No fevers, chills, nausea, or vomiting noted at this time. 10/15/18 on evaluation today patient actually appears to be doing very well in regard to his bilateral lower extremities. He's been tolerating the dressing changes without complication. Fortunately there does not appear to be any evidence of active infection at this time which is great news. Overall I'm actually very pleased with how this has progressed in just one visits time. Readmission: 08/14/2020 upon evaluation today patient presents for re-evaluation here in our clinic. He is having issues with his left ankle region as well as his right toe and his right heel. He tells me that the toe and heel actually began as a area that was itching that he was scratching and then subsequently opened up into wounds. These may have been abscess areas I presume based on what I am seeing  currently. With regard to his left ankle region he tells me this was a similar type occurrence although he does have venous stasis this very well may be more of a venous leg ulcer more than anything. Nonetheless I do believe that the patient would benefit from appropriate and aggressive wound care to try to help get things under better control here. He does have history of a stroke on the left side affecting him to some degree there that he is able to stand although he does have some residual weakness. Otherwise again the patient does have chronic venous insufficiency as previously noted. His arterial studies most recently obtained showed that he had an ABI on the right of 1.16 with a TBI of 0.52 and on the left and ABI of 1.14 with a TBI of 0.81. That was obtained on 06/19/2020. 08/28/2020 upon evaluation today patient appears to be doing decently well in regard to his wounds in general. He has been tolerating the dressing changes without complication. Fortunately there does not appear to be any signs of active infection which is great news. With that being said I think the [facility] is doing a good job I would recommend that we likely continue with that currently. Objective Constitutional Well-nourished and well-hydrated in no acute distress. Vitals Time Taken: 9:05 AM, Height: 67 in, Weight: 160 lbs, BMI: 25.1, Temperature: 98.4 F, Pulse: 98 bpm, Respiratory Rate: 16 breaths/min, Blood Pressure: 108/75 mmHg. Respiratory normal breathing without difficulty. Psychiatric this patient is able to make decisions and demonstrates good insight into disease process. Alert and Oriented x 3. pleasant and cooperative. General Notes: Upon inspection patient's wound bed showed signs of good granulation epithelization at this point. There does not appear to be any evidence of active infection which is great news and overall very pleased with where things stand today. No fevers, chills, nausea,  vomiting, or diarrhea. Integumentary (Hair, Skin) Wound #6 status is Open. Original cause of wound was Gradually Appeared. The wound is located on the Right Toe Second. The wound measures Bily, Chivas ([id number]) 1.6cm length x 0.4cm width x 0.1cm depth; 0.503cm^2 area and 0.05cm^3 volume.  There is no tunneling or undermining noted. There is a none present amount of drainage noted. There is no granulation within the wound bed. There is a large (67-100%) amount of necrotic tissue within the wound bed including Eschar and Adherent Slough. Wound #7 status is Open. Original cause of wound was Gradually Appeared. The wound is located on the Right Calcaneus. The wound measures 0.7cm length x 0.3cm width x 0.1cm depth; 0.165cm^2 area and 0.016cm^3 volume. There is Fat Layer (Subcutaneous Tissue) exposed. There is no tunneling or undermining noted. There is a medium amount of serosanguineous drainage noted. There is medium (34-66%) pink, pale granulation within the wound bed. There is a medium (34-66%) amount of necrotic tissue within the wound bed including Adherent Slough. Wound #8 status is Open. Original cause of wound was Gradually Appeared. The wound is located on the Left,Medial Ankle. The wound measures 5.5cm length x 2.7cm width x 0.1cm depth; 11.663cm^2 area and 1.166cm^3 volume. There is Fat Layer (Subcutaneous Tissue) exposed. There is no tunneling or undermining noted. There is a medium amount of serosanguineous drainage noted. There is small (1-33%) red, pink granulation within the wound bed. There is a large (67-100%) amount of necrotic tissue within the wound bed including Eschar and Adherent Slough. 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 Bathing/ Shower/ Hygiene: May shower with wound dressing protected with water repellent cover or cast protector. Edema Control - Lymphedema / Segmental Compressive Device / Other: Elevate, Exercise Daily and Avoid Standing for Long Periods of Time. Elevate legs to the level of the heart and pump ankles as often as possible Elevate leg(s) parallel to the floor when sitting. DO YOUR BEST to sleep in the bed at night. DO NOT sleep in your recliner. Long hours of sitting in a recliner leads to swelling of the legs and/or potential wounds on your backside. WOUND #6: - Toe Second Wound Laterality: Right Cleanser: Normal Saline 3 x Per Week/30 Days Discharge Instructions: Wash your hands with soap and water. Remove old dressing, discard into plastic bag and place into trash. Cleanse the wound with Normal Saline prior to applying a clean dressing using gauze sponges, not tissues or cotton balls. Do not scrub or use excessive force. Pat dry using gauze sponges, not tissue or cotton balls. Primary Dressing: Hydrofera Blue Ready Transfer Foam, 2.5x2.5 (in/in) (Generic) 3 x Per Week/30 Days Discharge Instructions: Apply Hydrofera Blue Ready to wound bed as directed Secondary Dressing: Bordered Gauze Sterile-HBD 4x4 (in/in) (Generic) 3 x Per Week/30 Days Discharge Instructions: Cover wound with Bordered Guaze Sterile as directed WOUND #7: - Calcaneus Wound Laterality: Right Cleanser: Normal Saline 3 x Per Week/30 Days Discharge Instructions: Wash your hands with soap and water. Remove old dressing, discard into plastic bag and place into trash. Cleanse the wound with Normal Saline prior to applying a clean dressing using gauze sponges, not tissues or cotton balls. Do not scrub or use excessive force. Pat dry using gauze sponges, not tissue  or cotton balls. Primary Dressing: Hydrofera Blue Ready Transfer Foam, 2.5x2.5 (in/in) 3 x Per Week/30 Days Discharge Instructions: Apply Hydrofera Blue Ready to wound bed as directed Secondary Dressing: Bordered Gauze Sterile-HBD 4x4 (in/in) (  Generic) 3 x Per Week/30 Days Discharge Instructions: Cover wound with Bordered Guaze Sterile as directed WOUND #8: - Ankle Wound Laterality: Left, Medial Cleanser: Normal Saline (Generic) 3 x Per Week/30 Days Discharge Instructions: Wash your hands with soap and water. Remove old dressing, discard into plastic bag and place into trash. Cleanse the wound with Normal Saline prior to applying a clean dressing using gauze sponges, not tissues or cotton balls. Do not scrub or use excessive force. Pat dry using gauze sponges, not tissue or cotton balls. Primary Dressing: Hydrofera Blue Ready Transfer Foam, 2.5x2.5 (in/in) (Generic) 3 x Per Week/30 Days Discharge Instructions: Apply Hydrofera Blue Ready to wound bed as directed Secondary Dressing: ABD Pad 5x9 (in/in) 3 x Per Week/30 Days Discharge Instructions: Cover with ABD pad Secondary Dressing: Kerlix 4.5 x 4.1 (in/yd) 3 x Per Week/30 Days Discharge Instructions: Apply Kerlix 4.5 x 4.1 (in/yd) as instructed Secured With: 34M Medipore H Soft Cloth Surgical Tape, 2x2 (in/yd) (Generic) 3 x Per Week/30 Days Zelaya, Mavric ([id number]) 1. I would suggest that we go ahead and continue with the [facility] I think we can discontinue the Santyl as the patient's wounds where we were using the Santyl seem to be nice and clean upon evaluation today. 2. I would also recommend that we continue with the [facility] Blue to all 3 wounds at this point. 3. I would also recommend we wrapped with conformer and then make sure that there is no tape on the patient's leg at any point. ABD pads can be used over the ankle to help with excessive draining. We will see patient back for reevaluation in 2 weeks here in the clinic.  If anything worsens or changes patient will contact our office for additional recommendations. Electronic Signature(s) Signed: 08/28/2020 9:36:13 AM By: Worthy Keeler PA-C Previous Signature: 08/28/2020 9:36:00 AM Version By: Worthy Keeler PA-C Entered By: Worthy Keeler on 08/28/2020 09:36:12 Chad [name] ([id number]) -------------------------------------------------------------------------------- SuperBill Details Patient Name: Chad [name] Date of Service: 08/28/2020 Medical Record Number: [id number] Patient Account Number: [id number] Date of Birth/Sex: Sep 17, 1960 (59 y.o. 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 Facility Procedures CPT4 Code: [id number] Description: 99213 - WOUND CARE VISIT-LEV 3 EST PT Modifier: Quantity: 1 Physician Procedures CPT4 Code: [id number] Description: 99213 - WC PHYS LEVEL 3 - EST PT Modifier: Quantity: 1 CPT4 Code: Description: ICD-10 Diagnosis Description I87.2 Venous insufficiency (chronic) (peripheral) L97.322 Non-pressure chronic ulcer of left ankle with fat layer exposed L97.412 Non-pressure chronic ulcer of right heel and midfoot with fat layer exp L97.512  Non-pressure chronic ulcer of other part of right foot with fat layer e Modifier: osed xposed Quantity: Electronic Signature(s) Signed: 08/28/2020 9:36:50 AM By: Worthy Keeler PA-C Entered By: Worthy Keeler on  08/28/2020 09:36:48

## 2020-08-28 NOTE — Progress Notes (Deleted)
  The note originally documented on this encounter has been moved the the encounter in which it belongs.  

## 2020-08-28 NOTE — Progress Notes (Signed)
Jonathon Bellows MD, MRCP(U.K) [address]  [place]  [place], [place] [address]  Main: [phone]  Fax: [phone]   Gastroenterology Consultation  Referring Provider:     Cammie Sickle, * Primary Care Physician:  [name], MD Primary Gastroenterologist:  Dr. Jonathon Bellows  Reason for Consultation:     Abnormal CT scan         HPI:   Chad [name] is a 60 y.o. y/o male underwent a CT scan of Chest/abdomen on 08/01/2020 for restaging urological neoplasm . Noted progressive Rt renal pelvis and ureteral tumor and findings suspicious of a neoplasm of the descending and sigmoid colon junction.  He states that he has never had a colonoscopy.  No family history of colon cancer or polyps.  He has had difficulty having a bowel movement recently.  States the stool is very hard.  He has to strain a lot.  Denies any rectal bleeding.  Past Medical History:  Diagnosis Date  . Anemia   . Anxiety   . ARF (acute respiratory failure) ([place])   . Bladder cancer ([place])   . COPD (chronic obstructive pulmonary disease) ([place])   . Depression   . Dysphagia   . Family history of colon cancer   . Family history of kidney cancer   . Family history of leukemia   . Family history of prostate cancer   . GERD (gastroesophageal reflux disease)   . Hepatitis    chronic hep c  . Hydronephrosis   . Hydronephrosis with ureteral stricture   . Hyperlipidemia   . Knee pain    Left  . Malignant neoplasm of colon ([place])   . Nerve pain   . Peripheral vascular disease ([place])   . Prostate cancer ([place])   . Stroke ([place])   . Urinary frequency   . Venous hypertension of both lower extremities     Past Surgical History:  Procedure Laterality Date  . COLON SURGERY     En bloc extended right hemicolectomy 07/2017  . CYSTOSCOPY W/ RETROGRADES Right 08/30/2018   Procedure: CYSTOSCOPY WITH RETROGRADE PYELOGRAM;  Surgeon: Hollice Espy, MD;  Location: [facility];  Service: Urology;  Laterality: Right;  .  CYSTOSCOPY WITH STENT PLACEMENT Right 04/25/2018   Procedure: CYSTOSCOPY WITH STENT PLACEMENT;  Surgeon: Hollice Espy, MD;  Location: [facility];  Service: Urology;  Laterality: Right;  . CYSTOSCOPY WITH STENT PLACEMENT Right 08/30/2018   Procedure: [place] WITH STENT Exchange;  Surgeon: Hollice Espy, MD;  Location: [facility];  Service: Urology;  Laterality: Right;  . CYSTOSCOPY WITH STENT PLACEMENT Right 03/07/2019   Procedure: CYSTOSCOPY WITH STENT Exchange;  Surgeon: Hollice Espy, MD;  Location: [facility];  Service: Urology;  Laterality: Right;  . CYSTOSCOPY WITH STENT PLACEMENT Right 11/21/2019   Procedure: CYSTOSCOPY WITH STENT Exchange;  Surgeon: Hollice Espy, MD;  Location: [facility];  Service: Urology;  Laterality: Right;  . LOWER EXTREMITY ANGIOGRAPHY Left 05/23/2019   Procedure: LOWER EXTREMITY ANGIOGRAPHY;  Surgeon: Algernon Huxley, MD;  Location: [facility];  Service: Cardiovascular;  Laterality: Left;  . LOWER EXTREMITY ANGIOGRAPHY Right 05/30/2019   Procedure: LOWER EXTREMITY ANGIOGRAPHY;  Surgeon: Algernon Huxley, MD;  Location: [facility] [facility] [facility];  Service: Cardiovascular;  Laterality: Right;  . LOWER EXTREMITY ANGIOGRAPHY Right 02/13/2020   Procedure: LOWER EXTREMITY ANGIOGRAPHY;  Surgeon: Algernon Huxley, MD;  Location: [place] [facility];  Service: Cardiovascular;  Laterality: Right;  . LOWER EXTREMITY ANGIOGRAPHY Left 02/20/2020  Procedure: LOWER EXTREMITY ANGIOGRAPHY;  Surgeon: Algernon Huxley, MD;  Location: [facility] [facility];  Service: Cardiovascular;  Laterality: Left;  . PORTA CATH INSERTION N/A 02/28/2019   Procedure: PORTA CATH INSERTION;  Surgeon: Algernon Huxley, MD;  Location: [facility];  Service: Cardiovascular;  Laterality: N/A;  . tumor removed       Prior to Admission medications   Medication Sig Start Date End Date Taking? Authorizing Provider  acetaminophen (TYLENOL) 325 MG tablet Take 650 mg by mouth 3 (three) times daily.     [provider]  aspirin EC 81 MG tablet Take 1 tablet (81 mg total) by mouth daily. 05/23/19   Algernon Huxley, MD  atorvastatin (LIPITOR) 10 MG tablet Take 1 tablet (10 mg total) by mouth daily. 05/23/19 05/31/20  Algernon Huxley, MD  Cholecalciferol (VITAMIN D) 50 MCG (2000 UT) CAPS Take by mouth.    [provider]  ciprofloxacin (CIPRO) 500 MG tablet Take 500 mg by mouth 2 (two) times daily.    [provider]  clopidogrel (PLAVIX) 75 MG tablet Take 1 tablet (75 mg total) by mouth daily. 05/23/19   Algernon Huxley, MD  cyclobenzaprine (FLEXERIL) 10 MG tablet Take 10 mg by mouth at bedtime.    [provider]  diclofenac sodium (VOLTAREN) 1 % GEL Apply 2 g topically See admin instructions. Apply 2 g topically 4 times daily. Apply 2 g to left shoulder, apply 2 g to left hip and 2 g to left knee 4 times daily per [facility] instructions    [provider]  escitalopram (LEXAPRO) 5 MG tablet Take 5 mg by mouth daily.    [provider]  gabapentin (NEURONTIN) 100 MG capsule Take 100 mg by mouth 3 (three) times daily.    [provider]  hydroxypropyl methylcellulose / hypromellose (ISOPTO TEARS / GONIOVISC) 2.5 % ophthalmic solution Place 1 drop into both eyes every 12 (twelve) hours.    [provider]  mirtazapine (REMERON) 7.5 MG tablet Take 7.5 mg by mouth at bedtime.    [provider]  Oxycodone HCl 10 MG TABS Take 10 mg by mouth every 6 (six) hours as needed for pain.    [provider]  pantoprazole (PROTONIX) 40 MG tablet Take 40 mg by mouth daily.    [provider]  Pollen Extracts (PROSTAT PO) Take 30 mLs by mouth.    [provider]  RESTORE CALCIUM ALGINATE EX Apply topically. Note: Dressing located on left ankle. Facility cleaning wound "every day shift and applying wound cleanser and appication of calcium Alginate and cover with dry dressing and wrapped with Kerlix"    [provider]     Family History  Problem Relation Age of Onset  . Prostate cancer Father 60  . Cancer Brother 33       unsure type  . Cancer Paternal Uncle        unsure type  . Cancer Maternal Grandmother        unsure type  . Cancer Paternal Grandmother        unsure type  . Kidney cancer Paternal Grandfather   . Cancer Other        unsure types  . Leukemia Son   . Cancer Son        other cancers, possibly colon     Social History   Tobacco Use  . Smoking status: Current Every Day Smoker    Packs/day:  1.00    Types: Cigarettes  . Smokeless tobacco: Never Used  Vaping Use  . Vaping Use: Never used  Substance Use Topics  . Alcohol use: Not Currently  . Drug use: Not Currently    Allergies as of 08/28/2020 - Review Complete 08/27/2020  Allergen Reaction Noted  . Penicillins Rash 11/11/2017    Review of Systems:    All systems reviewed and negative except where noted in HPI.   Physical Exam:  There were no vitals taken for this visit. No LMP for male patient. Psych:  Alert and cooperative. Normal mood and affect. General:   Alert,  Well-developed, well-nourished, pleasant and cooperative in NAD Head:  Normocephalic and atraumatic. Eyes:  Sclera clear, no icterus.   Conjunctiva pink. Lungs:  Respirations even and unlabored.  Clear throughout to auscultation.   No wheezes, crackles, or rhonchi. No acute distress. Heart:  Regular rate and rhythm; no murmurs, clicks, rubs, or gallops. Abdomen:  Normal bowel sounds.  No bruits.  Soft, non-tender and non-distended without masses, hepatosplenomegaly or hernias noted.  No guarding or rebound tenderness.    Neurologic:  Alert and oriented x3;  grossly normal neurologically. Psych:  Alert and cooperative. Normal mood and affect.  Imaging Studies: CT CHEST ABDOMEN PELVIS W CONTRAST  Result Date: 08/01/2020 CLINICAL DATA:  Restaging urologic neoplasm. History of right ureteral neoplasm. Patient undergoing immunotherapy with Keytruda.  EXAM: CT CHEST, ABDOMEN, AND PELVIS WITH CONTRAST TECHNIQUE: Multidetector CT imaging of the chest, abdomen and pelvis was performed following the standard protocol during bolus administration of intravenous contrast. CONTRAST:  175mL OMNIPAQUE IOHEXOL 300 MG/ML  SOLN COMPARISON:  Abdominal/pelvic CT scan 04/17/2020 and chest CT 12/09/2019. FINDINGS: CT CHEST FINDINGS Cardiovascular: The heart is normal in size. No pericardial effusion. The aorta is normal in caliber. No dissection. Stable coronary artery calcifications. The right IJ power port is in good position. No complicating features. Mediastinum/Nodes: No mediastinal hilar mass or adenopathy. Small scattered lymph nodes are stable. The esophagus is grossly normal. Lungs/Pleura: Dependent bibasilar subpleural atelectasis but no infiltrates or effusions. Exam is somewhat limited by breathing motion artifact. No worrisome pulmonary nodules to suggest pulmonary metastatic disease. No pleural effusions or pleural nodules. Musculoskeletal: No chest wall mass, supraclavicular or axillary adenopathy. Small scattered axillary lymph nodes are stable bilaterally. The bony thorax is intact. No findings suspicious for osseous metastatic disease. CT ABDOMEN PELVIS FINDINGS Hepatobiliary: No hepatic lesions or intrahepatic biliary dilatation. Gallbladder is unremarkable. No common bile duct dilatation. Pancreas: No mass, inflammation or ductal dilatation. Stable mild pancreatic atrophy. Spleen: Stable borderline splenic enlargement. No splenic lesions. Adrenals/Urinary Tract: The adrenal glands are normal in stable. The left kidney is unremarkable. There is a stable left renal cyst. No worrisome renal lesions. The left ureter is unremarkable. The right kidney demonstrates stable chronic hydronephrosis despite the double-J ureteral stent. There is associated moderate renal cortical thinning which appears progressive. Stable diffuse soft tissue thickening and enhancement  involving the upper right ureter extending into the renal pelvis. Findings consistent with known ureteral cancer. The upper ureteral soft tissue thickening appears progressive with the ureter measuring a maximum of 16 mm on image 80/2. This measured a maximum of 12 mm on the prior CT scan. Findings worrisome for progressive tumor. There is also vague soft tissue thickening around the distal right ureter/UVJ region which could also represent tumor. No obvious bladder mass. Median lobe hypertrophy of the prostate gland is noted impressing on the base of the bladder. Stomach/Bowel: The stomach,  duodenum and small bowel are unremarkable. Asymmetric colonic wall thickening involving the descending colon upper sigmoid colon junction region. This was less apparent on the study from 12/09/2019 and 04/17/2020. In retrospect I think this area was hypermetabolic on the prior PET-CT from 2019. Could not exclude colonic neoplasm. Recommend correlation with colonoscopy. Vascular/Lymphatic: Stable age advanced atherosclerotic calcifications involving the aorta and branch vessels. No aneurysm or dissection. Stable small scattered upper abdominal lymph nodes. Small scattered stable retroperitoneal lymph nodes. Numerous bilateral pelvic lymph nodes are also stable. No new or progressive findings are identified. Small inguinal lymph nodes are also stable. Reproductive: Mild prostate gland enlargement with median lobe hypertrophy impressing on the base of the bladder. The seminal vesicles appear normal. Other: No free abdominal or free pelvic fluid collections. Musculoskeletal: No significant bony findings. No findings suspicious for metastatic bone disease. Stable age advanced degenerative changes involving both hips. IMPRESSION: 1. CT findings worrisome for progressive right renal pelvis and ureteral tumor. There is also progressive right renal cortical thinning, decreased perfusion and hydronephrosis. 2. Overall stable pelvic  lymphadenopathy. No new or progressive findings. 3. Findings suspicious for colonic neoplasm involving the descending colon sigmoid colon junction region. Recommend colonoscopy. 4. Mild soft tissue thickening around the distal right ureter could reflect ureteral tumor. Aortic Atherosclerosis (ICD10-I70.0). Electronically Signed   By: Marijo Sanes M.D.   On: 08/01/2020 16:31    Assessment and Plan:   Clint Biello is a 60 y.o. y/o male has been referred for urgent colonoscopy for findings of possible colonic neoplasm on CT scan affecting junction of descending and sigmoid colon on CT scan performed to stage an urological/uroterial  cancer.    Plan  1. Colonoscopy urgent  : Plan has been discussed with Dr [name]   I have discussed alternative options, risks & benefits,  which include, but are not limited to, bleeding, infection, perforation,respiratory complication & drug reaction.  The patient agrees with this plan & written consent will be obtained.    Follow up as needed  Dr Jonathon Bellows MD,MRCP(U.K)

## 2020-08-29 NOTE — Progress Notes (Signed)
VALIANT, SINGLER ([id number]) Visit Report for 08/28/2020 Arrival Information Details Patient Name: Chad [name], Chad [name] Date of Service: 08/28/2020 9:00 AM Medical Record Number: [id number] Patient Account Number: [id number] Date of Birth/Sex: [birth year] (60 y.o. M) Treating RN: Dolan Amen Primary Care Lillan Mccreadie: Clayborn Bigness Other Clinician: Referring Patrena Santalucia: Clayborn Bigness Treating Doctor Sheahan/Extender: Skipper Cliche in Treatment: 2 Visit Information History Since Last Visit Added or deleted any medications: No Patient Arrived: Wheel Chair Any new allergies or adverse reactions: No Arrival Time: 09:04 Had a fall or experienced change in No Accompanied By: self activities of daily living that may affect Transfer Assistance: Manual risk of falls: Patient Identification Verified: Yes Signs or symptoms of abuse/neglect since last visito No Secondary Verification Process Completed: Yes Hospitalized since last visit: No Patient Requires Transmission-Based No Implantable device outside of the clinic excluding No Precautions: cellular tissue based products placed in the center Patient Has Alerts: Yes since last visit: Patient Alerts: Patient on Blood Has Dressing in Place as Prescribed: Yes Thinner Has Compression in Place as Prescribed: Yes ABI right 1.16 Pain Present Now: Yes ABI left 1.14 Electronic Signature(s) Signed: 08/28/2020 4:20:22 PM By: Lorine Bears RCP, RRT, CHT Entered By: Lorine Bears on 08/28/2020 09:06:08 Chad [name] ([id number]) -------------------------------------------------------------------------------- Clinic Level of Care Assessment Details Patient Name: Chad [name] Date of Service: 08/28/2020 9:00 AM Medical Record Number: [id number] Patient Account Number: [id number] Date of Birth/Sex: 07-24-1961 (59 y.o. M) Treating RN: Dolan Amen Primary Care Dara Beidleman: Clayborn Bigness Other Clinician: Referring Lissandra Keil: Clayborn Bigness Treating Bernie Ransford/Extender: Skipper Cliche in Treatment: 2 Clinic Level of Care Assessment Items TOOL 4 Quantity Score X - Use when only an EandM is performed on FOLLOW-UP visit 1 0 ASSESSMENTS - Nursing Assessment / Reassessment X - Reassessment of Co-morbidities (includes updates in patient status) 1 10 X- 1 5 Reassessment of Adherence to Treatment Plan ASSESSMENTS - Wound and Skin Assessment / Reassessment []  - Simple Wound Assessment / Reassessment - one wound 0 X- 1 5 Complex Wound Assessment / Reassessment - multiple wounds []  - 0 Dermatologic / Skin Assessment (not related to wound area) ASSESSMENTS - Focused Assessment X - Circumferential Edema Measurements - multi extremities 1 5 []  - 0 Nutritional Assessment / Counseling / Intervention []  - 0 Lower Extremity Assessment (monofilament, tuning fork, pulses) []  - 0 Peripheral Arterial Disease Assessment (using hand held doppler) ASSESSMENTS - Ostomy and/or Continence Assessment and Care []  - Incontinence Assessment and Management 0 []  - 0 Ostomy Care Assessment and Management (repouching, etc.) PROCESS - Coordination of Care X - Simple Patient / Family Education for ongoing care 1 15 []  - 0 Complex (extensive) Patient / Family Education for ongoing care []  - 0 Staff obtains Programmer, systems, Records, Test Results / Process Orders []  - 0 Staff telephones HHA, Nursing Homes / Clarify orders / etc []  - 0 Routine Transfer to another Facility (non-emergent condition) []  - 0 Routine Hospital Admission (non-emergent condition) []  - 0 New Admissions / Biomedical engineer / Ordering NPWT, Apligraf, etc. []  - 0 Emergency Hospital Admission (emergent condition) X- 1 10 Simple Discharge Coordination []  - 0 Complex (extensive) Discharge Coordination PROCESS - Special Needs []  - Pediatric / Minor Patient Management 0 []  - 0 Isolation Patient Management []  - 0 Hearing / Language / Visual special needs []  -  0 Assessment of Community assistance (transportation, D/C planning, etc.) []  - 0 Additional assistance / Altered mentation []  - 0 Support Surface(s) Assessment (bed, cushion, seat, etc.) INTERVENTIONS - Wound Cleansing /  Measurement Chad [name], Chad [name] ([id number]) []  - 0 Simple Wound Cleansing - one wound X- 3 5 Complex Wound Cleansing - multiple wounds X- 1 5 Wound Imaging (photographs - any number of wounds) []  - 0 Wound Tracing (instead of photographs) []  - 0 Simple Wound Measurement - one wound X- 3 5 Complex Wound Measurement - multiple wounds INTERVENTIONS - Wound Dressings []  - Small Wound Dressing one or multiple wounds 0 X- 1 15 Medium Wound Dressing one or multiple wounds []  - 0 Large Wound Dressing one or multiple wounds []  - 0 Application of Medications - topical []  - 0 Application of Medications - injection INTERVENTIONS - Miscellaneous []  - External ear exam 0 []  - 0 Specimen Collection (cultures, biopsies, blood, body fluids, etc.) []  - 0 Specimen(s) / Culture(s) sent or taken to Lab for analysis []  - 0 Patient Transfer (multiple staff / Civil Service fast streamer / Similar devices) []  - 0 Simple Staple / Suture removal (25 or less) []  - 0 Complex Staple / Suture removal (26 or more) []  - 0 Hypo / Hyperglycemic Management (close monitor of Blood Glucose) []  - 0 Ankle / Brachial Index (ABI) - do not check if billed separately X- 1 5 Vital Signs Has the patient been seen at the hospital within the last three years: Yes Total Score: 105 Level Of Care: New/Established - Level 3 Electronic Signature(s) Signed: 08/28/2020 11:59:19 AM By: Georges Mouse, Minus Breeding RN Entered By: Georges Mouse, Minus Breeding on 08/28/2020 09:36:10 Chad [name] ([id number]) -------------------------------------------------------------------------------- Encounter Discharge Information Details Patient Name: Chad [name] Date of Service: 08/28/2020 9:00 AM Medical Record Number:  [id number] Patient Account Number: [id number] Date of Birth/Sex: 03-18-1961 (59 y.o. M) Treating RN: Carlene Coria Primary Care Noelani Harbach: Clayborn Bigness Other Clinician: Referring Muaad Boehning: Clayborn Bigness Treating Cervando Durnin/Extender: Skipper Cliche in Treatment: 2 Encounter Discharge Information Items Discharge Condition: Stable Ambulatory Status: Ambulatory Discharge Destination: Home Transportation: Private Auto Accompanied By: self Schedule Follow-up Appointment: Yes Clinical Summary of Care: Patient Declined Electronic Signature(s) Signed: 08/29/2020 9:59:20 AM By: Carlene Coria RN Entered By: Carlene Coria on 08/28/2020 09:49:36 Chad [name] ([id number]) -------------------------------------------------------------------------------- Lower Extremity Assessment Details Patient Name: Chad [name] Date of Service: 08/28/2020 9:00 AM Medical Record Number: [id number] Patient Account Number: [id number] Date of Birth/Sex: 1961/04/29 (59 y.o. M) Treating RN: Carlene Coria Primary Care Maysie Parkhill: Clayborn Bigness Other Clinician: Referring Lucerito Rosinski: Clayborn Bigness Treating Soyla Bainter/Extender: Jeri Cos Weeks in Treatment: 2 Edema Assessment Assessed: [Left: No] [Right: No] [Left: Edema] [Right: :] Calf Left: Right: Point of Measurement: 33 cm From Medial Instep 33 cm 34 cm Ankle Left: Right: Point of Measurement: 10 cm From Medial Instep 20 cm 20.6 cm Vascular Assessment Pulses: Dorsalis Pedis Palpable: [Left:Yes] [Right:Yes] Electronic Signature(s) Signed: 08/29/2020 9:59:20 AM By: Carlene Coria RN Entered By: Carlene Coria on 08/28/2020 09:25:25 Chad [name] ([id number]) -------------------------------------------------------------------------------- Multi Wound Chart Details Patient Name: Chad [name] Date of Service: 08/28/2020 9:00 AM Medical Record Number: [id number] Patient Account Number: [id number] Date of Birth/Sex: 25-Jul-1961 (59 y.o. M) Treating RN: Dolan Amen Primary  Care Amaya Blakeman: Clayborn Bigness Other Clinician: Referring Madisynn Plair: Clayborn Bigness Treating Cyle Kenyon/Extender: Jeri Cos Weeks in Treatment: 2 Vital Signs Height(in): 49 Pulse(bpm): 48 Weight(lbs): 160 Blood Pressure(mmHg): 108/75 Body Mass Index(BMI): 25 Temperature(F): 98.4 Respiratory Rate(breaths/min): 16 Photos: Wound Location: Right Toe Second Right Calcaneus Left, Medial Ankle Wounding Event: Gradually Appeared Gradually Appeared Gradually Appeared Primary Etiology: Abscess Abscess Venous Leg Ulcer Comorbid History: Anemia, Chronic Obstructive Anemia, Chronic Obstructive Anemia, Chronic Obstructive Pulmonary Disease (COPD), Pulmonary Disease (COPD), Pulmonary Disease (COPD),  Hepatitis C, Received Hepatitis C, Received Hepatitis C, Received Chemotherapy Chemotherapy Chemotherapy Date Acquired: 03/28/2020 08/29/2019 07/12/2019 Weeks of Treatment: 2 2 2  Wound Status: Open Open Open Measurements L x W x D (cm) 1.6x0.4x0.1 0.7x0.3x0.1 5.5x2.7x0.1 Area (cm) : 0.503 0.165 11.663 Volume (cm) : 0.05 0.016 1.166 % Reduction in Area: 58.20% 61.80% -98.00% % Reduction in Volume: 58.30% 62.80% -98.00% Classification: Full Thickness Without Exposed Full Thickness Without Exposed Full Thickness Without Exposed Support Structures Support Structures Support Structures Exudate Amount: None Present Medium Medium Exudate Type: N/A Serosanguineous Serosanguineous Exudate Color: N/A red, brown red, brown Granulation Amount: None Present (0%) Medium (34-66%) Small (1-33%) Granulation Quality: N/A Pink, Pale Red, Pink Necrotic Amount: Large (67-100%) Medium (34-66%) Large (67-100%) Necrotic Tissue: Eschar, Adherent [place] Exposed Structures: Fascia: No Fat Layer (Subcutaneous Tissue): Fat Layer (Subcutaneous Tissue): Fat Layer (Subcutaneous Tissue): Yes Yes No Fascia: No Fascia: No Tendon: No Tendon: No Tendon: No Muscle: No Muscle: No Muscle:  No Joint: No Joint: No Joint: No Bone: No Bone: No Bone: No Epithelialization: None None None Treatment Notes Electronic Signature(s) Signed: 08/28/2020 11:59:19 AM By: Georges Mouse, Minus Breeding RN Entered By: Georges Mouse, Minus Breeding on 08/28/2020 09:31:24 Chad [name] ([id number]) -------------------------------------------------------------------------------- Multi-Disciplinary Care Plan Details Patient Name: Chad [name] Date of Service: 08/28/2020 9:00 AM Medical Record Number: [id number] Patient Account Number: [id number] Date of Birth/Sex: 1961-01-03 (59 y.o. M) Treating RN: Dolan Amen Primary Care Delance Weide: Clayborn Bigness Other Clinician: Referring Jarron Curley: Clayborn Bigness Treating Indiyah Paone/Extender: Skipper Cliche in Treatment: 2 Active Inactive Wound/Skin Impairment Nursing Diagnoses: Knowledge deficit related to ulceration/compromised skin integrity Goals: Patient/caregiver will verbalize understanding of skin care regimen Date Initiated: 08/14/2020 Target Resolution Date: 09/14/2020 Goal Status: Active Ulcer/skin breakdown will have a volume reduction of 30% by week 4 Date Initiated: 08/14/2020 Target Resolution Date: 09/14/2020 Goal Status: Active Interventions: Assess patient/caregiver ability to obtain necessary supplies Assess patient/caregiver ability to perform ulcer/skin care regimen upon admission and as needed Assess ulceration(s) every visit Notes: Electronic Signature(s) Signed: 08/28/2020 11:59:19 AM By: Georges Mouse, Minus Breeding RN Entered By: Georges Mouse, Minus Breeding on 08/28/2020 09:31:13 Chad [name] ([id number]) -------------------------------------------------------------------------------- Pain Assessment Details Patient Name: Chad [name] Date of Service: 08/28/2020 9:00 AM Medical Record Number: [id number] Patient Account Number: [id number] Date of Birth/Sex: Dec 29, 1960 (59 y.o. M) Treating RN: Carlene Coria Primary Care Synai Prettyman: Clayborn Bigness  Other Clinician: Referring Preeya Cleckley: Clayborn Bigness Treating Latoyna Hird/Extender: Skipper Cliche in Treatment: 2 Active Problems Location of Pain Severity and Description of Pain Patient Has Paino Yes Site Locations With Dressing Change: Yes Duration of the Pain. Constant / Intermittento Intermittent Rate the pain. Current Pain Level: 4 Worst Pain Level: 5 Least Pain Level: 0 Tolerable Pain Level: 5 Character of Pain Describe the Pain: Aching Pain Management and Medication Current Pain Management: Medication: Yes Cold Application: No Rest: Yes Massage: No Activity: No T.E.N.S.: No Heat Application: No Leg drop or elevation: No Is the Current Pain Management Adequate: Inadequate How does your wound impact your activities of daily livingo Sleep: Yes Bathing: No Appetite: No Relationship With Others: No Bladder Continence: No Emotions: No Bowel Continence: No Work: No Toileting: No Drive: No Dressing: No Hobbies: No Electronic Signature(s) Signed: 08/29/2020 9:59:20 AM By: Carlene Coria RN Entered By: Carlene Coria on 08/28/2020 09:12:43 Chad [name] ([id number]) -------------------------------------------------------------------------------- Patient/Caregiver Education Details Patient Name: Chad [name] Date of Service: 08/28/2020 9:00 AM Medical Record Number: [id number] Patient Account Number: [id number] Date of Birth/Gender: 12/14/1960 (60 y.o. M) Treating RN: Dolan Amen Primary  Care Physician: Beverely Risen Other Clinician: Referring Physician: Beverely Risen Treating Physician/Extender: Rowan Blase in Treatment: 2 Education Assessment Education Provided To: Patient Education Topics Provided Wound/Skin Impairment: Methods: Explain/Verbal Responses: State content correctly Electronic Signature(s) Signed: 08/28/2020 11:59:19 AM By: Phillis Haggis, Dondra Prader RN Entered By: Phillis Haggis, Dondra Prader on 08/28/2020 09:36:31 Chad [name]  ([id number]) -------------------------------------------------------------------------------- Wound Assessment Details Patient Name: Chad [name] Date of Service: 08/28/2020 9:00 AM Medical Record Number: [id number] Patient Account Number: [id number] Date of Birth/Sex: 1961/06/10 (59 y.o. M) Treating RN: Yevonne Pax Primary Care Amanat Hackel: Beverely Risen Other Clinician: Referring Breyah Akhter: Beverely Risen Treating Kaileah Shevchenko/Extender: [name] Weeks in Treatment: 2 Wound Status Wound Number: 6 Primary Abscess Etiology: Wound Location: Right Toe Second Wound Open Wounding Event: Gradually Appeared Status: Date Acquired: 03/28/2020 Comorbid Anemia, Chronic Obstructive Pulmonary Disease (COPD), Weeks Of Treatment: 2 History: Hepatitis C, Received Chemotherapy Clustered Wound: No Photos Wound Measurements Length: (cm) 1.6 Width: (cm) 0.4 Depth: (cm) 0.1 Area: (cm) 0.503 Volume: (cm) 0.05 % Reduction in Area: 58.2% % Reduction in Volume: 58.3% Epithelialization: None Tunneling: No Undermining: No Wound Description Classification: Full Thickness Without Exposed Support Structures Exudate Amount: None Present Foul Odor After Cleansing: No Slough/Fibrino Yes Wound Bed Granulation Amount: None Present (0%) Exposed Structure Necrotic Amount: Large (67-100%) Fascia Exposed: No Necrotic Quality: Eschar, Adherent Slough Fat Layer (Subcutaneous Tissue) Exposed: No Tendon Exposed: No Muscle Exposed: No Joint Exposed: No Bone Exposed: No Treatment Notes Wound #6 (Toe Second) Wound Laterality: Right Cleanser Normal Saline Discharge Instruction: Wash your hands with soap and water. Remove old dressing, discard into plastic bag and place into trash. Cleanse the wound with Normal Saline prior to applying a clean dressing using gauze sponges, not tissues or cotton balls. Do not scrub or use excessive force. Pat dry using gauze sponges, not tissue or cotton balls. Peri-Wound  Care Chad [name], Chad [name] ([id number]) Topical Primary Dressing Hydrofera Blue Ready Transfer Foam, 2.5x2.5 (in/in) Discharge Instruction: Apply Hydrofera Blue Ready to wound bed as directed Secondary Dressing Bordered Gauze Sterile-HBD 4x4 (in/in) Discharge Instruction: Cover wound with Bordered Guaze Sterile as directed Secured With Compression Wrap Compression Stockings Add-Ons Electronic Signature(s) Signed: 08/29/2020 9:59:20 AM By: Yevonne Pax RN Entered By: Yevonne Pax on 08/28/2020 09:23:36 Chad [name] ([id number]) -------------------------------------------------------------------------------- Wound Assessment Details Patient Name: Chad [name] Date of Service: 08/28/2020 9:00 AM Medical Record Number: [id number] Patient Account Number: [id number] Date of Birth/Sex: [birth year] (59 y.o. M) Treating RN: Yevonne Pax Primary Care Peace Noyes: Beverely Risen Other Clinician: Referring Friend Dorfman: Beverely Risen Treating Jonne Rote/Extender: [name] Weeks in Treatment: 2 Wound Status Wound Number: 7 Primary Abscess Etiology: Wound Location: Right Calcaneus Wound Open Wounding Event: Gradually Appeared Status: Date Acquired: 08/29/2019 Comorbid Anemia, Chronic Obstructive Pulmonary Disease (COPD), Weeks Of Treatment: 2 History: Hepatitis C, Received Chemotherapy Clustered Wound: No Photos Wound Measurements Length: (cm) 0.7 Width: (cm) 0.3 Depth: (cm) 0.1 Area: (cm) 0.165 Volume: (cm) 0.016 % Reduction in Area: 61.8% % Reduction in Volume: 62.8% Epithelialization: None Tunneling: No Undermining: No Wound Description Classification: Full Thickness Without Exposed Support Structures Exudate Amount: Medium Exudate Type: Serosanguineous Exudate Color: red, brown Foul Odor After Cleansing: No Slough/Fibrino Yes Wound Bed Granulation Amount: Medium (34-66%) Exposed Structure Granulation Quality: Pink, Pale Fascia Exposed: No Necrotic Amount: Medium (34-66%) Fat Layer  (Subcutaneous Tissue) Exposed: Yes Necrotic Quality: Adherent Slough Tendon Exposed: No Muscle Exposed: No Joint Exposed: No Bone Exposed: No Treatment Notes Wound #7 (Calcaneus) Wound Laterality: Right Cleanser Normal Saline Discharge Instruction: Wash your hands with soap and water.  Remove old dressing, discard into plastic bag and place into trash. Cleanse the wound with Normal Saline prior to applying a clean dressing using gauze sponges, not tissues or cotton balls. Do not scrub or use excessive force. Pat dry using gauze sponges, not tissue or cotton balls. Chad [name], Chad [name] ([id number]) Peri-Wound Care Topical Primary Dressing Hydrofera Blue Ready Transfer Foam, 2.5x2.5 (in/in) Discharge Instruction: Apply Hydrofera Blue Ready to wound bed as directed Secondary Dressing Bordered Gauze Sterile-HBD 4x4 (in/in) Discharge Instruction: Cover wound with Bordered Guaze Sterile as directed Secured With Compression Wrap Compression Stockings Add-Ons Electronic Signature(s) Signed: 08/29/2020 9:59:20 AM By: Carlene Coria RN Entered By: Carlene Coria on 08/28/2020 09:24:05 Chad [name] ([id number]) -------------------------------------------------------------------------------- Wound Assessment Details Patient Name: Chad [name] Date of Service: 08/28/2020 9:00 AM Medical Record Number: [id number] Patient Account Number: [id number] Date of Birth/Sex: 1961-03-20 (59 y.o. M) Treating RN: Carlene Coria Primary Care Charleen Madera: Clayborn Bigness Other Clinician: Referring Kaina Orengo: Clayborn Bigness Treating Anikah Hogge/Extender: Jeri Cos Weeks in Treatment: 2 Wound Status Wound Number: 8 Primary Venous Leg Ulcer Etiology: Wound Location: Left, Medial Ankle Wound Open Wounding Event: Gradually Appeared Status: Date Acquired: 07/12/2019 Comorbid Anemia, Chronic Obstructive Pulmonary Disease (COPD), Weeks Of Treatment: 2 History: Hepatitis C, Received Chemotherapy Clustered Wound:  No Photos Wound Measurements Length: (cm) 5.5 Width: (cm) 2.7 Depth: (cm) 0.1 Area: (cm) 11.663 Volume: (cm) 1.166 % Reduction in Area: -98% % Reduction in Volume: -98% Epithelialization: None Tunneling: No Undermining: No Wound Description Classification: Full Thickness Without Exposed Support Structures Exudate Amount: Medium Exudate Type: Serosanguineous Exudate Color: red, brown Foul Odor After Cleansing: No Slough/Fibrino Yes Wound Bed Granulation Amount: Small (1-33%) Exposed Structure Granulation Quality: Red, Pink Fascia Exposed: No Necrotic Amount: Large (67-100%) Fat Layer (Subcutaneous Tissue) Exposed: Yes Necrotic Quality: Eschar, Adherent Slough Tendon Exposed: No Muscle Exposed: No Joint Exposed: No Bone Exposed: No Treatment Notes Wound #8 (Ankle) Wound Laterality: Left, Medial Cleanser Normal Saline Discharge Instruction: Wash your hands with soap and water. Remove old dressing, discard into plastic bag and place into trash. Cleanse the wound with Normal Saline prior to applying a clean dressing using gauze sponges, not tissues or cotton balls. Do not scrub or use excessive force. Pat dry using gauze sponges, not tissue or cotton balls. Chad [name], Chad [name] ([id number]) Peri-Wound Care Topical Primary Dressing Hydrofera Blue Ready Transfer Foam, 2.5x2.5 (in/in) Discharge Instruction: Apply Hydrofera Blue Ready to wound bed as directed Secondary Dressing ABD Pad 5x9 (in/in) Discharge Instruction: Cover with ABD pad Kerlix 4.5 x 4.1 (in/yd) Discharge Instruction: Apply Kerlix 4.5 x 4.1 (in/yd) as instructed Secured With 42M Sunol Surgical Tape, 2x2 (in/yd) Compression Wrap Compression Stockings Add-Ons Electronic Signature(s) Signed: 08/29/2020 9:59:20 AM By: Carlene Coria RN Entered By: Carlene Coria on 08/28/2020 09:24:52 Chad [name]  ([id number]) -------------------------------------------------------------------------------- [place] Details Patient Name: Chad [name] Date of Service: 08/28/2020 9:00 AM Medical Record Number: [id number] Patient Account Number: [id number] Date of Birth/Sex: 1960-07-30 (60 y.o. M) Treating RN: Dolan Amen Primary Care Lukas Pelcher: Clayborn Bigness Other Clinician: Referring Vaiden Adames: Clayborn Bigness Treating Sidni Fusco/Extender: Skipper Cliche in Treatment: 2 Vital Signs Time Taken: 09:05 Temperature (F): 98.4 Height (in): 67 Pulse (bpm): 98 Weight (lbs): 160 Respiratory Rate (breaths/min): 16 Body Mass Index (BMI): 25.1 Blood Pressure (mmHg): 108/75 Reference Range: 80 - 120 mg / dl Electronic Signature(s) Signed: 08/28/2020 4:20:22 PM By: Lorine Bears RCP, RRT, CHT Entered By: Lorine Bears on 08/28/2020 09:06:57

## 2020-08-30 ENCOUNTER — Other Ambulatory Visit: Payer: Self-pay

## 2020-08-30 ENCOUNTER — Ambulatory Visit
Admission: RE | Admit: 2020-08-30 | Discharge: 2020-08-30 | Disposition: A | Discharge status: Home / Self Care | Payer: Medicaid Other | Source: Ambulatory Visit | Attending: Internal Medicine | Admitting: Internal Medicine

## 2020-08-30 DIAGNOSIS — Z960 Presence of urogenital implants: Secondary | ICD-10-CM | POA: Insufficient documentation

## 2020-08-30 DIAGNOSIS — R590 Localized enlarged lymph nodes: Secondary | ICD-10-CM | POA: Diagnosis not present

## 2020-08-30 DIAGNOSIS — C661 Malignant neoplasm of right ureter: Secondary | ICD-10-CM | POA: Insufficient documentation

## 2020-08-30 DIAGNOSIS — C679 Malignant neoplasm of bladder, unspecified: Secondary | ICD-10-CM | POA: Insufficient documentation

## 2020-08-30 LAB — GLUCOSE, CAPILLARY: Glucose-Capillary: 88 mg/dL (ref 70–99)

## 2020-08-30 MED ORDER — FLUDEOXYGLUCOSE F - 18 (FDG) INJECTION
9.9000 | Freq: Once | INTRAVENOUS | Status: AC | PRN
  Administered 2020-08-30: 8.54 via INTRAVENOUS

## 2020-08-31 ENCOUNTER — Other Ambulatory Visit
Admission: RE | Admit: 2020-08-31 | Discharge: 2020-08-31 | Disposition: A | Discharge status: Home / Self Care | Payer: Medicaid Other | Source: Ambulatory Visit | Attending: Gastroenterology | Admitting: Gastroenterology

## 2020-08-31 DIAGNOSIS — Z01812 Encounter for preprocedural laboratory examination: Secondary | ICD-10-CM | POA: Insufficient documentation

## 2020-08-31 DIAGNOSIS — Z20822 Contact with and (suspected) exposure to covid-19: Secondary | ICD-10-CM | POA: Diagnosis not present

## 2020-08-31 LAB — SARS CORONAVIRUS 2 (TAT 6-24 HRS): SARS Coronavirus 2: NEGATIVE

## 2020-09-04 ENCOUNTER — Encounter: Payer: Self-pay | Admitting: Anesthesiology

## 2020-09-04 ENCOUNTER — Encounter
Admission: RE | Disposition: A | Discharge status: Home / Self Care | Payer: Self-pay | Source: Ambulatory Visit | Attending: Gastroenterology

## 2020-09-04 ENCOUNTER — Ambulatory Visit
Admission: RE | Admit: 2020-09-04 | Discharge: 2020-09-04 | Disposition: A | Discharge status: Home / Self Care | Payer: Medicaid Other | Source: Ambulatory Visit | Attending: Gastroenterology | Admitting: Gastroenterology

## 2020-09-04 ENCOUNTER — Ambulatory Visit: Admission: RE | Admit: 2020-09-04 | Payer: Medicaid Other | Source: Home / Self Care | Admitting: Gastroenterology

## 2020-09-04 ENCOUNTER — Encounter: Admission: RE | Payer: Self-pay | Source: Home / Self Care

## 2020-09-04 SURGERY — COLONOSCOPY WITH PROPOFOL
Anesthesia: General

## 2020-09-04 SURGERY — COLONOSCOPY
Anesthesia: General

## 2020-09-05 ENCOUNTER — Encounter: Payer: Self-pay | Admitting: Gastroenterology

## 2020-09-05 ENCOUNTER — Encounter
Admission: RE | Disposition: A | Discharge status: Skilled Nursing Facility | Payer: Self-pay | Source: Home / Self Care | Attending: Gastroenterology

## 2020-09-05 ENCOUNTER — Telehealth: Payer: Self-pay | Admitting: Gastroenterology

## 2020-09-05 ENCOUNTER — Telehealth: Payer: Self-pay

## 2020-09-05 ENCOUNTER — Other Ambulatory Visit: Payer: Self-pay

## 2020-09-05 ENCOUNTER — Ambulatory Visit
Admission: RE | Admit: 2020-09-05 | Discharge: 2020-09-05 | Disposition: A | Discharge status: Skilled Nursing Facility | Payer: Medicaid Other | Attending: Gastroenterology | Admitting: Gastroenterology

## 2020-09-05 DIAGNOSIS — R9389 Abnormal findings on diagnostic imaging of other specified body structures: Secondary | ICD-10-CM

## 2020-09-05 SURGERY — COLONOSCOPY
Anesthesia: General

## 2020-09-05 MED ORDER — SODIUM CHLORIDE 0.9 % IV SOLN: INTRAVENOUS | Status: DC

## 2020-09-05 NOTE — Telephone Encounter (Signed)
Please call scheduler [name] to reschedule his Colonoscopy .  [phone]

## 2020-09-05 NOTE — Progress Notes (Signed)
Patient arrived today for scheduled for Colonoscopy with Dr Vicente Males.  After talking with the patient and verifying with the nurse at [facility], it was determined that the patient has continued to take his Plavix daily.  Dr. Vicente Males spoke with the patient and informed him that the Plavix must be held for 5 days prior to the procedure.  Patient verbalized understanding, but he is very frustrated that he completed the prep and has been on clear liquids for 2 days.  Dr Georgeann Oppenheim office will notify [facility] [address] with further instructions regarding rescheduling the Colonoscopy, administering the prep for optimal results, and holding the Plavix for 5 days.  The patient will be transported back to [facility] at this time.

## 2020-09-05 NOTE — Telephone Encounter (Signed)
Left message for scheduler at [facility] to call us back to schedule procedure for patient.

## 2020-09-06 NOTE — Telephone Encounter (Signed)
Returned Magda Paganini call to schedule appointment for patient. LVM to call office back.

## 2020-09-10 ENCOUNTER — Telehealth: Payer: Self-pay | Admitting: Gastroenterology

## 2020-09-10 ENCOUNTER — Other Ambulatory Visit: Payer: Self-pay

## 2020-09-10 DIAGNOSIS — R9389 Abnormal findings on diagnostic imaging of other specified body structures: Secondary | ICD-10-CM

## 2020-09-10 MED ORDER — NA SULFATE-K SULFATE-MG SULF 17.5-3.13-1.6 GM/177ML PO SOLN: 1.0000 | Freq: Once | ORAL | 0 refills | Status: AC

## 2020-09-10 NOTE — Telephone Encounter (Signed)
Please call to reschedule procedure  

## 2020-09-10 NOTE — Progress Notes (Signed)
Procedure has been rescheduled and updated instructions will be mailed out. Prep has been sent to [facility].

## 2020-09-10 NOTE — Telephone Encounter (Signed)
Procedure has been reschedule. Spoke with Magda Paganini in regards to instructions for colonoscopy and prep for patient. She verbalized understanding.

## 2020-09-10 NOTE — Telephone Encounter (Signed)
error 

## 2020-09-11 ENCOUNTER — Other Ambulatory Visit: Payer: Self-pay

## 2020-09-11 ENCOUNTER — Encounter: Payer: Medicaid Other | Admitting: Physician Assistant

## 2020-09-11 DIAGNOSIS — L97512 Non-pressure chronic ulcer of other part of right foot with fat layer exposed: Secondary | ICD-10-CM | POA: Diagnosis not present

## 2020-09-11 NOTE — Progress Notes (Addendum)
JUWON, SCRIPTER ([id number]) Visit Report for 09/11/2020 Chief Complaint Document Details Patient Name: Chad [name], Chad [name] Date of Service: 09/11/2020 9:00 AM Medical Record Number: [id number] Patient Account Number: [id number] Date of Birth/Sex: 02-15-1961 (60 y.o. M) Treating RN: Dolan Amen Primary Care Provider: Clayborn Bigness Other Clinician: Referring Provider: Clayborn Bigness Treating Provider/Extender: Skipper Cliche in Treatment: 4 Information Obtained from: Patient Chief Complaint Left ankle and right foot and heel ulcers Electronic Signature(s) Signed: 09/11/2020 8:56:04 AM By: Worthy Keeler PA-C Entered By: Worthy Keeler on 09/11/2020 08:56:04 Chad [name] ([id number]) -------------------------------------------------------------------------------- Debridement Details Patient Name: Chad [name] Date of Service: 09/11/2020 9:00 AM Medical Record Number: [id number] Patient Account Number: [id number] Date of Birth/Sex: Feb 04, 1961 (59 y.o. M) Treating RN: Dolan Amen Primary Care Provider: Clayborn Bigness Other Clinician: Referring Provider: Clayborn Bigness Treating Provider/Extender: Skipper Cliche in Treatment: 4 Debridement Performed for Wound #8 Left,Medial Ankle Assessment: Performed By: Physician Tommie Sams., PA-C Debridement Type: Debridement Severity of Tissue Pre Debridement: Fat layer exposed Level of Consciousness (Pre- Awake and Alert procedure): Pre-procedure Verification/Time Out Yes - 08:59 Taken: Start Time: 08:59 Pain Control: Lidocaine 4% Topical Solution Total Area Debrided (L x W): 5.5 (cm) x 2.3 (cm) = 12.65 (cm) Tissue and other material Viable, Non-Viable, Slough, Subcutaneous, Slough debrided: Level: Skin/Subcutaneous Tissue Debridement Description: Excisional Instrument: Curette Bleeding: Minimum Hemostasis Achieved: Pressure Response to Treatment: Procedure was tolerated well Level of Consciousness (Post- Awake and  Alert procedure): Post Debridement Measurements of Total Wound Length: (cm) 5.5 Width: (cm) 2.3 Depth: (cm) 0.2 Volume: (cm) 1.987 Character of Wound/Ulcer Post Debridement: Stable Severity of Tissue Post Debridement: Fat layer exposed Post Procedure Diagnosis Same as Pre-procedure Electronic Signature(s) Signed: 09/11/2020 9:30:16 AM By: Georges Mouse, Minus Breeding RN Signed: 09/11/2020 9:55:27 AM By: Worthy Keeler PA-C Entered By: Georges Mouse, Minus Breeding on 09/11/2020 09:00:40 Chad [name] ([id number]) -------------------------------------------------------------------------------- HPI Details Patient Name: Chad [name] Date of Service: 09/11/2020 9:00 AM Medical Record Number: [id number] Patient Account Number: [id number] Date of Birth/Sex: [birth year] (59 y.o. M) Treating RN: Dolan Amen Primary Care Provider: Clayborn Bigness Other Clinician: Referring Provider: Clayborn Bigness Treating Provider/Extender: Skipper Cliche in Treatment: 4 History of Present Illness HPI Description: 10/08/18 on evaluation today patient actually presents to our office for initial evaluation concerning wounds that he has of the bilateral lower extremities. 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 He also has bilateral venous stasis. He apparently has been homeless currently following discharge from the hospital apparently he has been placed at [facility] which is is a skilled nursing facility locally. Nonetheless fortunately he does not show any signs of infection at this time which is good news. In fact several of the wound actually appears to be showing some signs of improvement already in my pinion. There are a couple areas in the left leg in particular there likely gonna require some sharp debridement to help clear away some necrotic  tissue and help with more sufficient healing. No fevers, chills, nausea, or vomiting noted at this time. 10/15/18 on evaluation today patient actually appears to be doing very well in regard to his bilateral lower extremities. He's been tolerating the dressing changes without complication. Fortunately there does not appear to be any evidence of active infection at this time which is great news. Overall I'm actually very pleased with how this has progressed in just one  visits time. Readmission: 08/14/2020 upon evaluation today patient presents for re-evaluation here in our clinic. He is having issues with his left ankle region as well as his right toe and his right heel. He tells me that the toe and heel actually began as a area that was itching that he was scratching and then subsequently opened up into wounds. These may have been abscess areas I presume based on what I am seeing currently. With regard to his left ankle region he tells me this was a similar type occurrence although he does have venous stasis this very well may be more of a venous leg ulcer more than anything. Nonetheless I do believe that the patient would benefit from appropriate and aggressive wound care to try to help get things under better control here. He does have history of a stroke on the left side affecting him to some degree there that he is able to stand although he does have some residual weakness. Otherwise again the patient does have chronic venous insufficiency as previously noted. His arterial studies most recently obtained showed that he had an ABI on the right of 1.16 with a TBI of 0.52 and on the left and ABI of 1.14 with a TBI of 0.81. That was obtained on 06/19/2020. 08/28/2020 upon evaluation today patient appears to be doing decently well in regard to his wounds in general. He has been tolerating the dressing changes without complication. Fortunately there does not appear to be any signs of active infection which  is great news. With that being said I think the [facility] is doing a good job I would recommend that we likely continue with that currently. 09/11/2020 upon evaluation today patient's wounds did not appear to be doing too poorly but again he is not really showing signs of significant improvement with regard to any of the wounds on the right. None of them have Hydrofera Blue on them I am not exactly sure why this is not being followed as the facility did not contact us to let us know of any issues with obtaining dressings or otherwise. With that being said he is supposed to be using Hydrofera Blue on both of the wounds on the right foot as well as the ankle wound on the left side. Electronic Signature(s) Signed: 09/11/2020 10:20:43 AM By: Worthy Keeler PA-C Entered By: Worthy Keeler on 09/11/2020 10:20:42 Chad [name] ([id number]) -------------------------------------------------------------------------------- Physical Exam Details Patient Name: Chad [name] Date of Service: 09/11/2020 9:00 AM Medical Record Number: [id number] Patient Account Number: [id number] Date of Birth/Sex: [birth year] (59 y.o. M) Treating RN: Dolan Amen Primary Care Provider: Clayborn Bigness Other Clinician: Referring Provider: Clayborn Bigness Treating Provider/Extender: Jeri Cos Weeks in Treatment: 4 Constitutional Well-nourished and well-hydrated in no acute distress. Respiratory normal breathing without difficulty. Psychiatric this patient is able to make decisions and demonstrates good insight into disease process. Alert and Oriented x 3. pleasant and cooperative. Notes Upon inspection patient's wound bed actually showed signs of good granulation at this time at all locations. There is no significant slough buildup itself on the ankle which I did perform sharp debridement on today. He tolerated that without complication post debridement the wound bed appears to be doing better which is great  news. Electronic Signature(s) Signed: 09/11/2020 10:21:00 AM By: Worthy Keeler PA-C Entered By: Worthy Keeler on 09/11/2020 10:20:59 Chad [name] ([id number]) -------------------------------------------------------------------------------- Physician Orders Details Patient Name: Chad [name] Date of Service: 09/11/2020 9:00 AM Medical Record Number: [id number] Patient Account Number:  [id number] Date of Birth/Sex: 09/14/1960 (60 y.o. 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 Wound #7 Right Calcaneus o Clean wound with Normal Saline or wound cleanser. Wound #8 Left,Medial Ankle o Clean wound with Normal Saline or wound cleanser. Edema Control - Lymphedema / Segmental Compressive Device / Other Wound #6 Right Toe Second o Elevate, Exercise Daily and Avoid Standing for Long Periods of Time. o Elevate legs to the level of the heart and pump ankles as often as possible o Elevate leg(s) parallel to the floor when sitting. o DO YOUR BEST to sleep in the bed at night. DO NOT sleep in your recliner. Long hours of sitting in a recliner leads to swelling of the legs and/or potential wounds on your backside. Wound #7 Right Calcaneus o Elevate, Exercise Daily and Avoid Standing for  Long Periods of Time. o Elevate legs to the level of the heart and pump ankles as often as possible o Elevate leg(s) parallel to the floor when sitting. o DO YOUR BEST to sleep in the bed at night. DO NOT sleep in your recliner. Long hours of sitting in a recliner leads to swelling of the legs and/or potential wounds on your backside. Wound #8 Left,Medial Ankle o Elevate, Exercise Daily and Avoid Standing for Long Periods of Time. o Elevate legs to the level of the heart and pump ankles as often as possible o Elevate leg(s) parallel to the floor when sitting. o DO YOUR BEST to sleep in the bed at night. DO NOT sleep in your recliner. Long hours of sitting in a recliner leads to swelling of the legs and/or potential wounds on your backside. Wound Treatment Wound #6 - Toe Second Wound Laterality: Right Cleanser: Normal Saline 3 x Per Week/30 Days Discharge Instructions: Wash your hands with soap and water. Remove old dressing, discard into plastic bag and place into trash. Cleanse the wound with Normal Saline prior to applying a clean dressing using gauze sponges, not tissues or cotton balls. Do not scrub or use excessive force. Pat dry using gauze sponges, not tissue or cotton balls. Primary Dressing: Hydrofera Blue Ready Transfer Foam, 2.5x2.5 (in/in) (Generic) 3 x Per Week/30 Days Discharge Instructions: Apply Hydrofera Blue Ready to wound bed as directed Secondary Dressing: Bordered Gauze Sterile-HBD 4x4 (in/in) (Generic) 3 x Per Week/30 Days Discharge Instructions: Cover wound with Bordered Guaze Sterile as directed Wound #7 - Calcaneus Wound Laterality: Right Cleanser: Normal Saline 3 x Per Week/30 Days Chad [name], Chad [name] ([id number]) Discharge Instructions: Wash your hands with soap and water. Remove old dressing, discard into plastic bag and place into trash. Cleanse the wound with Normal Saline prior to applying a clean dressing using gauze sponges, not tissues or cotton  balls. Do not scrub or use excessive force. Pat dry using gauze sponges, not tissue or cotton balls. Primary Dressing: Hydrofera Blue Ready Transfer Foam, 2.5x2.5 (in/in) 3 x Per Week/30 Days Discharge Instructions: Apply Hydrofera  Blue Ready to wound bed as directed Secondary Dressing: Bordered Gauze Sterile-HBD 4x4 (in/in) (Generic) 3 x Per Week/30 Days Discharge Instructions: Cover wound with Bordered Guaze Sterile as directed Wound #8 - Ankle Wound Laterality: Left, Medial Cleanser: Normal Saline (Generic) 3 x Per Week/30 Days Discharge Instructions: Wash your hands with soap and water. Remove old dressing, discard into plastic bag and place into trash. Cleanse the wound with Normal Saline prior to applying a clean dressing using gauze sponges, not tissues or cotton balls. Do not scrub or use excessive force. Pat dry using gauze sponges, not tissue or cotton balls. Primary Dressing: Hydrofera Blue Ready Transfer Foam, 2.5x2.5 (in/in) (Generic) 3 x Per Week/30 Days Discharge Instructions: Apply Hydrofera Blue Ready to wound bed as directed Secondary Dressing: ABD Pad 5x9 (in/in) 3 x Per Week/30 Days Discharge Instructions: Cover with ABD pad Secondary Dressing: Kerlix 4.5 x 4.1 (in/yd) 3 x Per Week/30 Days Discharge Instructions: Apply Kerlix 4.5 x 4.1 (in/yd) as instructed Secured With: 42M Medipore H Soft Cloth Surgical Tape, 2x2 (in/yd) (Generic) 3 x Per Week/30 Days Notes Dressings need to be changed 3 x a week-Monday, Wednesday and Friday Electronic Signature(s) Signed: 09/11/2020 9:30:16 AM By: Georges Mouse, Minus Breeding RN Signed: 09/11/2020 9:55:27 AM By: Worthy Keeler PA-C Entered By: Georges Mouse, Minus Breeding on 09/11/2020 09:13:42 Chad [name] ([id number]) -------------------------------------------------------------------------------- Problem List Details Patient Name: Chad [name] Date of Service: 09/11/2020 9:00 AM Medical Record Number: [id number] Patient Account Number:  [id number] Date of Birth/Sex: [birth year] (60 y.o. M) Treating RN: Dolan Amen Primary Care Provider: Clayborn Bigness Other Clinician: Referring Provider: Clayborn Bigness Treating Provider/Extender: Skipper Cliche in Treatment: 4 Active Problems ICD-10 Encounter Code Description Active Date MDM Diagnosis I87.2 Venous insufficiency (chronic) (peripheral) 08/14/2020 No Yes L97.322 Non-pressure chronic ulcer of left ankle with fat layer exposed 08/14/2020 No Yes L97.412 Non-pressure chronic ulcer of right heel and midfoot with fat layer 08/14/2020 No Yes exposed L97.512 Non-pressure chronic ulcer of other part of right foot with fat layer 08/14/2020 No Yes exposed 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 M) Treating RN: Dolan Amen Primary Care Provider: Clayborn Bigness Other Clinician: Referring Provider: Clayborn Bigness Treating Provider/Extender: Skipper Cliche in Treatment: 4 Subjective Chief Complaint Information obtained from Patient Left ankle and right foot and heel ulcers History of Present Illness (HPI) 10/08/18 on evaluation today patient actually presents to our office for initial evaluation concerning wounds that he has of the bilateral lower extremities. 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 He also has bilateral venous stasis. He apparently has been homeless currently following discharge from the hospital apparently he has been placed at [facility] which is is a skilled nursing facility locally. Nonetheless fortunately he does not show any signs of infection at this time which is good news. In fact several of the wound actually appears to be showing some signs of improvement already in my pinion. There are a couple areas in the left leg in particular there likely gonna require  some sharp debridement to help clear away some necrotic tissue and help with more sufficient healing. No fevers, chills, nausea, or vomiting noted at this time. 10/15/18 on evaluation today patient actually appears to be doing very well in regard to his bilateral lower extremities. He's been tolerating the dressing changes without complication. Fortunately there does not appear to be any evidence of active infection at this time which is great news. Overall I'm actually very pleased with how this has progressed in just one visits time. Readmission: 08/14/2020 upon evaluation today patient presents for re-evaluation here in our clinic. He is having issues with his left ankle region as well as his right toe and his right heel. He tells me that the toe and heel actually began as a area that was itching that he was scratching and then subsequently opened up into wounds. These may have been abscess areas I presume based on what I am seeing currently. With regard to his left ankle region he tells me this was a similar type occurrence although he does have venous stasis this very well may be more of a venous leg ulcer more than anything. Nonetheless I do believe that the patient would benefit from appropriate and aggressive wound care to try to help get things under better control here. He does have history of a stroke on the left side affecting him to some degree there that he is able to stand although  he does have some residual weakness. Otherwise again the patient does have chronic venous insufficiency as previously noted. His arterial studies most recently obtained showed that he had an ABI on the right of 1.16 with a TBI of 0.52 and on the left and ABI of 1.14 with a TBI of 0.81. That was obtained on 06/19/2020. 08/28/2020 upon evaluation today patient appears to be doing decently well in regard to his wounds in general. He has been tolerating the dressing changes without complication. Fortunately there does not appear to be any signs of active infection which is great news. With that being said I think the [facility] is doing a good job I would recommend that we likely continue with that currently. 09/11/2020 upon evaluation today patient's wounds did not appear to be doing too poorly but again he is not really showing signs of significant improvement with regard to any of the wounds on the right. None of them have Hydrofera Blue on them I am not exactly sure why this is not being followed as the facility did not contact us to let us know of any issues with obtaining dressings or otherwise. With that being said he is supposed to be using Hydrofera Blue on both of the wounds on the right foot as well as the ankle wound on the left side. Objective Constitutional Well-nourished and well-hydrated in no acute distress. Vitals Time Taken: 8:47 AM, Height: 67 in, Weight: 160 lbs, BMI: 25.1, Temperature: 98.2 F, Pulse: 81 bpm, Respiratory Rate: 20 breaths/min, Blood Pressure: 110/75 mmHg. Respiratory normal breathing without difficulty. Psychiatric this patient is able to make decisions and demonstrates good insight into disease process. Alert and Oriented x 3. pleasant and cooperative. Chad [name], Chad [name] ([id number]) General Notes: Upon inspection patient's wound bed actually showed signs of good granulation at this time at all locations. There is no significant slough buildup itself on the  ankle which I did perform sharp debridement on today. He tolerated that without complication post debridement the wound bed appears to be doing better which is great news.  Integumentary (Hair, Skin) Wound #6 status is Open. Original cause of wound was Gradually Appeared. The wound is located on the Right Toe Second. The wound measures 1.5cm length x 0.5cm width x 0.2cm depth; 0.589cm^2 area and 0.118cm^3 volume. There is Fat Layer (Subcutaneous Tissue) exposed. There is no tunneling or undermining noted. There is a medium amount of serosanguineous drainage noted. There is medium (34-66%) granulation within the wound bed. There is a medium (34-66%) amount of necrotic tissue within the wound bed including Adherent Slough. Wound #7 status is Open. Original cause of wound was Gradually Appeared. The wound is located on the Right Calcaneus. The wound measures 0.7cm length x 0.3cm width x 0.1cm depth; 0.165cm^2 area and 0.016cm^3 volume. There is Fat Layer (Subcutaneous Tissue) exposed. There is no tunneling or undermining noted. There is a medium amount of serosanguineous drainage noted. There is large (67-100%) pink, pale granulation within the wound bed. There is no necrotic tissue within the wound bed. Wound #8 status is Open. Original cause of wound was Gradually Appeared. The wound is located on the Left,Medial Ankle. The wound measures 5.5cm length x 2.3cm width x 0.1cm depth; 9.935cm^2 area and 0.994cm^3 volume. There is Fat Layer (Subcutaneous Tissue) exposed. There is no tunneling or undermining noted. There is a medium amount of serosanguineous drainage noted. There is small (1-33%) red, pink granulation within the wound bed. There is a large (67-100%) amount of necrotic tissue within the wound bed including Adherent Slough. 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 There was a Excisional Skin/Subcutaneous Tissue Debridement with a total area of 12.65 sq cm performed by Tommie Sams., PA-C. With the following instrument(s): Curette to remove Viable and Non-Viable tissue/material. Material removed includes Subcutaneous Tissue and Slough and after achieving pain control using Lidocaine 4% Topical Solution. A time out was conducted at 08:59, prior to the start of the procedure. A Minimum amount of bleeding was controlled with Pressure. The procedure was tolerated well. Post Debridement Measurements: 5.5cm length x 2.3cm width x 0.2cm depth; 1.987cm^3 volume. Character of Wound/Ulcer Post Debridement is stable. Severity of Tissue Post Debridement is: Fat layer exposed. Post procedure Diagnosis Wound #8: Same as Pre-Procedure Plan Bathing/ Shower/ Hygiene: Wound #6 Right Toe Second: Clean wound with Normal Saline or wound cleanser. Wound #7 Right Calcaneus: Clean wound with Normal Saline or wound cleanser. Wound #8 Left,Medial Ankle: Clean wound with Normal Saline or wound cleanser. Edema Control - Lymphedema / Segmental Compressive Device / Other: Wound #6 Right Toe Second: Elevate, Exercise Daily and Avoid Standing for Long Periods of Time. Elevate legs to the level of the heart and pump ankles as often as possible Elevate leg(s) parallel to the floor when sitting. DO YOUR BEST to sleep in the bed at night. DO NOT sleep in your recliner. Long hours of sitting in a recliner leads to swelling of the legs and/or potential wounds on your backside. Wound #7 Right  Calcaneus: Elevate, Exercise Daily and Avoid Standing for Long Periods of Time. Elevate legs to the level of the heart and pump ankles  as often as possible Chad [name], Chad [name] ([id number]) Elevate leg(s) parallel to the floor when sitting. DO YOUR BEST to sleep in the bed at night. DO NOT sleep in your recliner. Long hours of sitting in a recliner leads to swelling of the legs and/or potential wounds on your backside. Wound #8 Left,Medial Ankle: Elevate, Exercise Daily and Avoid Standing for Long Periods of Time. Elevate legs to the level of the heart and pump ankles as often as possible Elevate leg(s) parallel to the floor when sitting. DO YOUR BEST to sleep in the bed at night. DO NOT sleep in your recliner. Long hours of sitting in a recliner leads to swelling of the legs and/or potential wounds on your backside. General Notes: Dressings need to be changed 3 x a week-Monday, Wednesday and Friday WOUND #6: - Toe Second Wound Laterality: Right Cleanser: Normal Saline 3 x Per Week/30 Days Discharge Instructions: Wash your hands with soap and water. Remove old dressing, discard into plastic bag and place into trash. Cleanse the wound with Normal Saline prior to applying a clean dressing using gauze sponges, not tissues or cotton balls. Do not scrub or use excessive force. Pat dry using gauze sponges, not tissue or cotton balls. Primary Dressing: Hydrofera Blue Ready Transfer Foam, 2.5x2.5 (in/in) (Generic) 3 x Per Week/30 Days Discharge Instructions: Apply Hydrofera Blue Ready to wound bed as directed Secondary Dressing: Bordered Gauze Sterile-HBD 4x4 (in/in) (Generic) 3 x Per Week/30 Days Discharge Instructions: Cover wound with Bordered Guaze Sterile as directed WOUND #7: - Calcaneus Wound Laterality: Right Cleanser: Normal Saline 3 x Per Week/30 Days Discharge Instructions: Wash your hands with soap and water. Remove old dressing, discard into plastic bag and place into trash. Cleanse the  wound with Normal Saline prior to applying a clean dressing using gauze sponges, not tissues or cotton balls. Do not scrub or use excessive force. Pat dry using gauze sponges, not tissue or cotton balls. Primary Dressing: Hydrofera Blue Ready Transfer Foam, 2.5x2.5 (in/in) 3 x Per Week/30 Days Discharge Instructions: Apply Hydrofera Blue Ready to wound bed as directed Secondary Dressing: Bordered Gauze Sterile-HBD 4x4 (in/in) (Generic) 3 x Per Week/30 Days Discharge Instructions: Cover wound with Bordered Guaze Sterile as directed WOUND #8: - Ankle Wound Laterality: Left, Medial Cleanser: Normal Saline (Generic) 3 x Per Week/30 Days Discharge Instructions: Wash your hands with soap and water. Remove old dressing, discard into plastic bag and place into trash. Cleanse the wound with Normal Saline prior to applying a clean dressing using gauze sponges, not tissues or cotton balls. Do not scrub or use excessive force. Pat dry using gauze sponges, not tissue or cotton balls. Primary Dressing: Hydrofera Blue Ready Transfer Foam, 2.5x2.5 (in/in) (Generic) 3 x Per Week/30 Days Discharge Instructions: Apply Hydrofera Blue Ready to wound bed as directed Secondary Dressing: ABD Pad 5x9 (in/in) 3 x Per Week/30 Days Discharge Instructions: Cover with ABD pad Secondary Dressing: Kerlix 4.5 x 4.1 (in/yd) 3 x Per Week/30 Days Discharge Instructions: Apply Kerlix 4.5 x 4.1 (in/yd) as instructed Secured With: 98M Medipore H Soft Cloth Surgical Tape, 2x2 (in/yd) (Generic) 3 x Per Week/30 Days 1. Would recommend currently that we continue with the [facility] I think that skin to be the best way to go I did replace that order today and we did contact the facility as well to try to ensure that utilizing the right thing. [place] was able to discuss this with the patient's nurse who is new herself. 2. I am also can  recommend that we continue with ABD pads and roll gauze to secure this get more room for the area to  drain which is more appropriate. 3. I am also can recommend the patient continue to elevate his legs much as possible to help with edema control. We will see patient back for reevaluation in 1 week here in the clinic. If anything worsens or changes patient will contact our office for additional recommendations. Electronic Signature(s) Signed: 09/11/2020 10:21:48 AM By: Worthy Keeler PA-C Entered By: Worthy Keeler on 09/11/2020 10:21:48 Chad [name] ([id number]) -------------------------------------------------------------------------------- SuperBill Details Patient Name: Chad [name] Date of Service: 09/11/2020 Medical Record Number: [id number] Patient Account Number: [id number] Date of Birth/Sex: Jun 01, 1961 (59 y.o. 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 part of right foot with fat layer exposed 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 side Facility Procedures CPT4 Code: 34742595 Description: 63875 - DEB SUBQ TISSUE 20 SQ CM/< Modifier: Quantity: 1 CPT4 Code: Description: ICD-10 Diagnosis Description L97.322 Non-pressure chronic ulcer of left ankle with fat layer exposed Modifier: Quantity: Physician Procedures CPT4 Code: 6433295 Description: 11042 - WC PHYS SUBQ TISS 20 SQ CM Modifier: Quantity: 1 CPT4 Code: Description: ICD-10 Diagnosis Description L97.322 Non-pressure chronic ulcer of left ankle with fat layer exposed Modifier: Quantity: Electronic Signature(s) Signed: 09/11/2020  10:21:55 AM By: Worthy Keeler PA-C Previous Signature: 09/11/2020 9:30:16 AM Version By: Georges Mouse, Minus Breeding RN Previous Signature: 09/11/2020 9:55:27 AM Version By: Worthy Keeler PA-C Entered By: Worthy Keeler on 09/11/2020 10:21:55

## 2020-09-13 NOTE — Progress Notes (Signed)
GARELD, OBRECHT ([id number]) Visit Report for 09/11/2020 Arrival Information Details Patient Name: Chad [name], Chad [name] Date of Service: 09/11/2020 9:00 AM Medical Record Number: [id number] Patient Account Number: [id number] Date of Birth/Sex: September 29, 1960 (60 y.o. M) Treating RN: Carlene Coria Primary Care Kyleah Pensabene: Clayborn Bigness Other Clinician: Referring Rhetta Cleek: Clayborn Bigness Treating Cola Highfill/Extender: Skipper Cliche in Treatment: 4 Visit Information History Since Last Visit All ordered tests and consults were completed: No Patient Arrived: Wheel Chair Added or deleted any medications: No Arrival Time: 08:35 Any new allergies or adverse reactions: No Accompanied By: self Had a fall or experienced change in No Transfer Assistance: None activities of daily living that may affect Patient Identification Verified: Yes risk of falls: Secondary Verification Process Completed: Yes Signs or symptoms of abuse/neglect since last visito No Patient Requires Transmission-Based No Hospitalized since last visit: No Precautions: Implantable device outside of the clinic excluding No Patient Has Alerts: Yes cellular tissue based products placed in the center Patient Alerts: Patient on Blood since last visit: Thinner Has Dressing in Place as Prescribed: Yes ABI right 1.16 Pain Present Now: No ABI left 1.14 Electronic Signature(s) Signed: 09/13/2020 9:40:15 AM By: Carlene Coria RN Entered By: Carlene Coria on 09/11/2020 08:47:16 Chad [name] ([id number]) -------------------------------------------------------------------------------- Clinic Level of Care Assessment Details Patient Name: Chad [name] Date of Service: 09/11/2020 9:00 AM Medical Record Number: [id number] Patient Account Number: [id number] Date of Birth/Sex: 1960-11-23 (59 y.o. M) Treating RN: Dolan Amen Primary Care Macgregor Aeschliman: Clayborn Bigness Other Clinician: Referring Tristyn Pharris: Clayborn Bigness Treating Tai Skelly/Extender: Skipper Cliche in Treatment: 4 Clinic Level of Care Assessment Items TOOL 1 Quantity Score []  - Use when EandM and Procedure is performed on INITIAL visit 0 ASSESSMENTS - Nursing Assessment / Reassessment []  - General Physical Exam (combine w/ comprehensive assessment (listed just below) when performed on new 0 pt. evals) []  - 0 Comprehensive Assessment (HX, ROS, Risk Assessments, Wounds Hx, etc.) ASSESSMENTS - Wound and Skin Assessment / Reassessment []  - Dermatologic / Skin Assessment (not related to wound area) 0 ASSESSMENTS - Ostomy and/or Continence Assessment and Care []  - Incontinence Assessment and Management 0 []  - 0 Ostomy Care Assessment and Management (repouching, etc.) PROCESS - Coordination of Care []  - Simple Patient / Family Education for ongoing care 0 []  - 0 Complex (extensive) Patient / Family Education for ongoing care []  - 0 Staff obtains Programmer, systems, Records, Test Results / Process Orders []  - 0 Staff telephones HHA, Nursing Homes / Clarify orders / etc []  - 0 Routine Transfer to another Facility (non-emergent condition) []  - 0 Routine Hospital Admission (non-emergent condition) []  - 0 New Admissions / Biomedical engineer / Ordering NPWT, Apligraf, etc. []  - 0 Emergency Hospital Admission (emergent condition) PROCESS - Special Needs []  - Pediatric / Minor Patient Management 0 []  - 0 Isolation Patient Management []  - 0 Hearing / Language / Visual special needs []  - 0 Assessment of Community assistance (transportation, D/C planning, etc.) []  - 0 Additional assistance / Altered mentation []  - 0 Support Surface(s) Assessment (bed, cushion, seat, etc.) INTERVENTIONS - Miscellaneous []  - External ear exam 0 []  - 0 Patient Transfer (multiple staff / Civil Service fast streamer / Similar devices) []  - 0 Simple Staple / Suture removal (25 or less) []  - 0 Complex Staple / Suture removal (26 or more) []  - 0 Hypo/Hyperglycemic Management (do not check if billed  separately) []  - 0 Ankle / Brachial Index (ABI) - do not check if billed separately Has the patient been seen at the hospital within  the last three years: Yes Total Score: 0 Level Of Care: ____ Chad [name] ([id number]) Electronic Signature(s) Signed: 09/11/2020 9:30:16 AM By: Georges Mouse, Minus Breeding RN Entered By: Georges Mouse, Minus Breeding on 09/11/2020 09:05:48 Chad [name] ([id number]) -------------------------------------------------------------------------------- Encounter Discharge Information Details Patient Name: Chad [name] Date of Service: 09/11/2020 9:00 AM Medical Record Number: [id number] Patient Account Number: [id number] Date of Birth/Sex: 11-Jul-1961 (59 y.o. M) Treating RN: Dolan Amen Primary Care Ryeleigh Santore: Clayborn Bigness Other Clinician: Referring Filicia Scogin: Clayborn Bigness Treating Mervil Wacker/Extender: Skipper Cliche in Treatment: 4 Encounter Discharge Information Items Post Procedure Vitals Discharge Condition: Stable Temperature (F): 98.2 Ambulatory Status: Wheelchair Pulse (bpm): 81 Discharge Destination: [place] Respiratory Rate (breaths/min): 20 Orders Sent: Yes Blood Pressure (mmHg): 110/75 Transportation: Private Auto Accompanied By: self Schedule Follow-up Appointment: Yes Clinical Summary of Care: Electronic Signature(s) Signed: 09/11/2020 9:30:16 AM By: Georges Mouse, Minus Breeding RN Entered By: Georges Mouse, Minus Breeding on 09/11/2020 09:22:25 Chad [name] ([id number]) -------------------------------------------------------------------------------- Lower Extremity Assessment Details Patient Name: Chad [name] Date of Service: 09/11/2020 9:00 AM Medical Record Number: [id number] Patient Account Number: [id number] Date of Birth/Sex: 03/28/1961 (59 y.o. M) Treating RN: Carlene Coria Primary Care Dayle Mcnerney: Clayborn Bigness Other Clinician: Referring Tamie Minteer: Clayborn Bigness Treating Macey Wurtz/Extender: Jeri Cos Weeks in Treatment: 4 Edema  Assessment Assessed: [Left: No] [Right: No] [Left: Edema] [Right: :] Calf Left: Right: Point of Measurement: 33 cm From Medial Instep 34 cm 34 cm Ankle Left: Right: Point of Measurement: 10 cm From Medial Instep 20.7 cm 20.2 cm Vascular Assessment Pulses: Dorsalis Pedis Palpable: [Left:Yes] [Right:Yes] Electronic Signature(s) Signed: 09/13/2020 9:40:15 AM By: Carlene Coria RN Entered By: Carlene Coria on 09/11/2020 08:53:10 Chad [name] ([id number]) -------------------------------------------------------------------------------- Multi Wound Chart Details Patient Name: Chad [name] Date of Service: 09/11/2020 9:00 AM Medical Record Number: [id number] Patient Account Number: [id number] Date of Birth/Sex: 1961-03-15 (59 y.o. M) Treating RN: Dolan Amen Primary Care Fernie Grimm: Clayborn Bigness Other Clinician: Referring Clyda Smyth: Clayborn Bigness Treating Launi Asencio/Extender: Skipper Cliche in Treatment: 4 Vital Signs Height(in): 6 Pulse(bpm): 37 Weight(lbs): 160 Blood Pressure(mmHg): 110/75 Body Mass Index(BMI): 25 Temperature(F): 98.2 Respiratory Rate(breaths/min): 20 Photos: Wound Location: Right Toe Second Right Calcaneus Left, Medial Ankle Wounding Event: Gradually Appeared Gradually Appeared Gradually Appeared Primary Etiology: Abscess Abscess Venous Leg Ulcer Comorbid History: Anemia, Chronic Obstructive Anemia, Chronic Obstructive Anemia, Chronic Obstructive Pulmonary Disease (COPD), Pulmonary Disease (COPD), Pulmonary Disease (COPD), Hepatitis C, Received Hepatitis C, Received Hepatitis C, Received Chemotherapy Chemotherapy Chemotherapy Date Acquired: 03/28/2020 08/29/2019 07/12/2019 Weeks of Treatment: 4 4 4  Wound Status: Open Open Open Measurements L x W x D (cm) 1.5x0.5x0.2 0.7x0.3x0.1 5.5x2.3x0.1 Area (cm) : 0.589 0.165 9.935 Volume (cm) : 0.118 0.016 0.994 % Reduction in Area: 51.00% 61.80% -68.70% % Reduction in Volume: 1.70% 62.80% -68.80% Classification:  Full Thickness Without Exposed Full Thickness Without Exposed Full Thickness Without Exposed Support Structures Support Structures Support Structures Exudate Amount: Medium Medium Medium Exudate Type: Serosanguineous Serosanguineous Serosanguineous Exudate Color: red, brown red, brown red, brown Granulation Amount: Medium (34-66%) Large (67-100%) Small (1-33%) Granulation Quality: N/A Pink, Pale Red, Pink Necrotic Amount: Medium (34-66%) None Present (0%) Large (67-100%) Exposed Structures: Fat Layer (Subcutaneous Tissue): Fat Layer (Subcutaneous Tissue): Fat Layer (Subcutaneous Tissue): Yes Yes Yes Fascia: No Fascia: No Fascia: No Tendon: No Tendon: No Tendon: No Muscle: No Muscle: No Muscle: No Joint: No Joint: No Joint: No Bone: No Bone: No Bone: No Epithelialization: None None Small (1-33%) Treatment Notes Electronic Signature(s) Signed: 09/11/2020 9:30:16 AM By: Georges Mouse, Minus Breeding RN Entered By: Georges Mouse, Minus Breeding on  09/11/2020 08:57:32 Chad [name], Chad [name] ([id number]) -------------------------------------------------------------------------------- Multi-Disciplinary Care Plan Details Patient Name: Chad [name], Chad [name] Date of Service: 09/11/2020 9:00 AM Medical Record Number: [id number] Patient Account Number: [id number] Date of Birth/Sex: [birth year] (60 y.o. M) Treating RN: Dolan Amen Primary Care Aaryanna Hyden: Clayborn Bigness Other Clinician: Referring Jesua Tamblyn: Clayborn Bigness Treating Polk Minor/Extender: Skipper Cliche in Treatment: 4 Active Inactive Wound/Skin Impairment Nursing Diagnoses: Knowledge deficit related to ulceration/compromised skin integrity Goals: Patient/caregiver will verbalize understanding of skin care regimen Date Initiated: 08/14/2020 Target Resolution Date: 09/14/2020 Goal Status: Active Ulcer/skin breakdown will have a volume reduction of 30% by week 4 Date Initiated: 08/14/2020 Target Resolution Date: 09/14/2020 Goal Status:  Active Interventions: Assess patient/caregiver ability to obtain necessary supplies Assess patient/caregiver ability to perform ulcer/skin care regimen upon admission and as needed Assess ulceration(s) every visit Notes: Electronic Signature(s) Signed: 09/11/2020 9:30:16 AM By: Georges Mouse, Minus Breeding RN Entered By: Georges Mouse, Minus Breeding on 09/11/2020 08:57:18 Chad [name] ([id number]) -------------------------------------------------------------------------------- Pain Assessment Details Patient Name: Chad [name] Date of Service: 09/11/2020 9:00 AM Medical Record Number: [id number] Patient Account Number: [id number] Date of Birth/Sex: 05-28-1961 (59 y.o. M) Treating RN: Carlene Coria Primary Care Yatziri Wainwright: Clayborn Bigness Other Clinician: Referring Shandria Clinch: Clayborn Bigness Treating Gustie Bobb/Extender: Skipper Cliche in Treatment: 4 Active Problems Location of Pain Severity and Description of Pain Patient Has Paino No Site Locations Pain Management and Medication Current Pain Management: Electronic Signature(s) Signed: 09/13/2020 9:40:15 AM By: Carlene Coria RN Entered By: Carlene Coria on 09/11/2020 08:47:59 Chad [name] ([id number]) -------------------------------------------------------------------------------- Patient/Caregiver Education Details Patient Name: Chad [name] Date of Service: 09/11/2020 9:00 AM Medical Record Number: [id number] Patient Account Number: [id number] Date of Birth/Gender: 26-Jun-1961 (59 y.o. M) Treating RN: Dolan Amen Primary Care Physician: Clayborn Bigness Other Clinician: Referring Physician: Clayborn Bigness Treating Physician/Extender: Skipper Cliche in Treatment: 4 Education Assessment Education Provided To: Patient Education Topics Provided Wound/Skin Impairment: Methods: Explain/Verbal Responses: State content correctly Electronic Signature(s) Signed: 09/11/2020 9:30:16 AM By: Georges Mouse, Minus Breeding RN Entered By: Georges Mouse, Minus Breeding  on 09/11/2020 09:06:09 Chad [name] ([id number]) -------------------------------------------------------------------------------- Wound Assessment Details Patient Name: Chad [name] Date of Service: 09/11/2020 9:00 AM Medical Record Number: [id number] Patient Account Number: [id number] Date of Birth/Sex: 02-13-1961 (59 y.o. M) Treating RN: Carlene Coria Primary Care Yanet Balliet: Clayborn Bigness Other Clinician: Referring Shermeka Rutt: Clayborn Bigness Treating Deijah Spikes/Extender: Jeri Cos Weeks in Treatment: 4 Wound Status Wound Number: 6 Primary Abscess Etiology: Wound Location: Right Toe Second Wound Open Wounding Event: Gradually Appeared Status: Date Acquired: 03/28/2020 Comorbid Anemia, Chronic Obstructive Pulmonary Disease (COPD), Weeks Of Treatment: 4 History: Hepatitis C, Received Chemotherapy Clustered Wound: No Photos Wound Measurements Length: (cm) 1.5 Width: (cm) 0.5 Depth: (cm) 0.2 Area: (cm) 0.589 Volume: (cm) 0.118 % Reduction in Area: 51% % Reduction in Volume: 1.7% Epithelialization: None Tunneling: No Undermining: No Wound Description Classification: Full Thickness Without Exposed Support Structures Exudate Amount: Medium Exudate Type: Serosanguineous Exudate Color: red, brown Foul Odor After Cleansing: No Slough/Fibrino Yes Wound Bed Granulation Amount: Medium (34-66%) Exposed Structure Necrotic Amount: Medium (34-66%) Fascia Exposed: No Necrotic Quality: Adherent Slough Fat Layer (Subcutaneous Tissue) Exposed: Yes Tendon Exposed: No Muscle Exposed: No Joint Exposed: No Bone Exposed: No Treatment Notes Wound #6 (Toe Second) Wound Laterality: Right Cleanser Normal Saline Discharge Instruction: Wash your hands with soap and water. Remove old dressing, discard into plastic bag and place into trash. Cleanse the wound with Normal Saline prior to applying a clean dressing using gauze sponges, not tissues or cotton balls. Do not scrub or use excessive  force. Fraser Din  dry using gauze sponges, not tissue or cotton balls. Chad [name], Chad [name] ([id number]) Peri-Wound Care Topical Primary Dressing Hydrofera Blue Ready Transfer Foam, 2.5x2.5 (in/in) Discharge Instruction: Apply Hydrofera Blue Ready to wound bed as directed Secondary Dressing Bordered Gauze Sterile-HBD 4x4 (in/in) Discharge Instruction: Cover wound with Bordered Guaze Sterile as directed Secured With Compression Wrap Compression Stockings Add-Ons Electronic Signature(s) Signed: 09/13/2020 9:40:15 AM By: Carlene Coria RN Entered By: Carlene Coria on 09/11/2020 08:49:29 Chad [name] ([id number]) -------------------------------------------------------------------------------- Wound Assessment Details Patient Name: Chad [name] Date of Service: 09/11/2020 9:00 AM Medical Record Number: [id number] Patient Account Number: [id number] Date of Birth/Sex: 10-May-1961 (59 y.o. M) Treating RN: Carlene Coria Primary Care Stephano Arrants: Clayborn Bigness Other Clinician: Referring Hiyab Nhem: Clayborn Bigness Treating Dorri Ozturk/Extender: Jeri Cos Weeks in Treatment: 4 Wound Status Wound Number: 7 Primary Abscess Etiology: Wound Location: Right Calcaneus Wound Open Wounding Event: Gradually Appeared Status: Date Acquired: 08/29/2019 Comorbid Anemia, Chronic Obstructive Pulmonary Disease (COPD), Weeks Of Treatment: 4 History: Hepatitis C, Received Chemotherapy Clustered Wound: No Photos Wound Measurements Length: (cm) 0.7 Width: (cm) 0.3 Depth: (cm) 0.1 Area: (cm) 0.165 Volume: (cm) 0.016 % Reduction in Area: 61.8% % Reduction in Volume: 62.8% Epithelialization: None Tunneling: No Undermining: No Wound Description Classification: Full Thickness Without Exposed Support Structures Exudate Amount: Medium Exudate Type: Serosanguineous Exudate Color: red, brown Foul Odor After Cleansing: No Slough/Fibrino No Wound Bed Granulation Amount: Large (67-100%) Exposed Structure Granulation  Quality: Pink, Pale Fascia Exposed: No Necrotic Amount: None Present (0%) Fat Layer (Subcutaneous Tissue) Exposed: Yes Tendon Exposed: No Muscle Exposed: No Joint Exposed: No Bone Exposed: No Treatment Notes Wound #7 (Calcaneus) Wound Laterality: Right Cleanser Normal Saline Discharge Instruction: Wash your hands with soap and water. Remove old dressing, discard into plastic bag and place into trash. Cleanse the wound with Normal Saline prior to applying a clean dressing using gauze sponges, not tissues or cotton balls. Do not scrub or use excessive force. Pat dry using gauze sponges, not tissue or cotton balls. Chad [name], Chad [name] ([id number]) Peri-Wound Care Topical Primary Dressing Hydrofera Blue Ready Transfer Foam, 2.5x2.5 (in/in) Discharge Instruction: Apply Hydrofera Blue Ready to wound bed as directed Secondary Dressing Bordered Gauze Sterile-HBD 4x4 (in/in) Discharge Instruction: Cover wound with Bordered Guaze Sterile as directed Secured With Compression Wrap Compression Stockings Add-Ons Electronic Signature(s) Signed: 09/13/2020 9:40:15 AM By: Carlene Coria RN Entered By: Carlene Coria on 09/11/2020 08:51:27 Chad [name] ([id number]) -------------------------------------------------------------------------------- Wound Assessment Details Patient Name: Chad [name] Date of Service: 09/11/2020 9:00 AM Medical Record Number: [id number] Patient Account Number: [id number] Date of Birth/Sex: 06/18/1961 (59 y.o. M) Treating RN: Carlene Coria Primary Care Iram Lundberg: Clayborn Bigness Other Clinician: Referring Tyris Eliot: Clayborn Bigness Treating Stoy Fenn/Extender: Jeri Cos Weeks in Treatment: 4 Wound Status Wound Number: 8 Primary Venous Leg Ulcer Etiology: Wound Location: Left, Medial Ankle Wound Open Wounding Event: Gradually Appeared Status: Date Acquired: 07/12/2019 Comorbid Anemia, Chronic Obstructive Pulmonary Disease (COPD), Weeks Of Treatment: 4 History: Hepatitis  C, Received Chemotherapy Clustered Wound: No Photos Wound Measurements Length: (cm) 5.5 Width: (cm) 2.3 Depth: (cm) 0.1 Area: (cm) 9.935 Volume: (cm) 0.994 % Reduction in Area: -68.7% % Reduction in Volume: -68.8% Epithelialization: Small (1-33%) Tunneling: No Undermining: No Wound Description Classification: Full Thickness Without Exposed Support Structures Exudate Amount: Medium Exudate Type: Serosanguineous Exudate Color: red, brown Foul Odor After Cleansing: No Slough/Fibrino Yes Wound Bed Granulation Amount: Small (1-33%) Exposed Structure Granulation Quality: Red, Pink Fascia Exposed: No Necrotic Amount: Large (67-100%) Fat Layer (Subcutaneous Tissue) Exposed: Yes Necrotic Quality: Adherent Slough Tendon Exposed: No Muscle  Exposed: No Joint Exposed: No Bone Exposed: No Treatment Notes Wound #8 (Ankle) Wound Laterality: Left, Medial Cleanser Normal Saline Discharge Instruction: Wash your hands with soap and water. Remove old dressing, discard into plastic bag and place into trash. Cleanse the wound with Normal Saline prior to applying a clean dressing using gauze sponges, not tissues or cotton balls. Do not scrub or use excessive force. Pat dry using gauze sponges, not tissue or cotton balls. Chad [name], Chad [name] ([id number]) Peri-Wound Care Topical Primary Dressing Hydrofera Blue Ready Transfer Foam, 2.5x2.5 (in/in) Discharge Instruction: Apply Hydrofera Blue Ready to wound bed as directed Secondary Dressing ABD Pad 5x9 (in/in) Discharge Instruction: Cover with ABD pad Kerlix 4.5 x 4.1 (in/yd) Discharge Instruction: Apply Kerlix 4.5 x 4.1 (in/yd) as instructed Secured With 86M Unity Surgical Tape, 2x2 (in/yd) Compression Wrap Compression Stockings Add-Ons Electronic Signature(s) Signed: 09/13/2020 9:40:15 AM By: Carlene Coria RN Entered By: Carlene Coria on 09/11/2020 08:52:03 Chad [name]  ([id number]) -------------------------------------------------------------------------------- [place] Details Patient Name: Chad [name] Date of Service: 09/11/2020 9:00 AM Medical Record Number: [id number] Patient Account Number: [id number] Date of Birth/Sex: Dec 20, 1960 (60 y.o. M) Treating RN: Carlene Coria Primary Care Adelina Collard: Clayborn Bigness Other Clinician: Referring Ladarrius Bogdanski: Clayborn Bigness Treating Jayleen Afonso/Extender: Skipper Cliche in Treatment: 4 Vital Signs Time Taken: 08:47 Temperature (F): 98.2 Height (in): 67 Pulse (bpm): 81 Weight (lbs): 160 Respiratory Rate (breaths/min): 20 Body Mass Index (BMI): 25.1 Blood Pressure (mmHg): 110/75 Reference Range: 80 - 120 mg / dl Electronic Signature(s) Signed: 09/13/2020 9:40:15 AM By: Carlene Coria RN Entered By: Carlene Coria on 09/11/2020 08:47:51

## 2020-09-14 ENCOUNTER — Other Ambulatory Visit: Payer: Self-pay

## 2020-09-14 ENCOUNTER — Encounter: Payer: Self-pay | Admitting: Nurse Practitioner

## 2020-09-14 ENCOUNTER — Non-Acute Institutional Stay: Payer: Medicaid Other | Admitting: Nurse Practitioner

## 2020-09-14 DIAGNOSIS — Z515 Encounter for palliative care: Secondary | ICD-10-CM

## 2020-09-14 DIAGNOSIS — C661 Malignant neoplasm of right ureter: Secondary | ICD-10-CM

## 2020-09-14 DIAGNOSIS — C61 Malignant neoplasm of prostate: Secondary | ICD-10-CM

## 2020-09-14 NOTE — Progress Notes (Signed)
[facility] Consult Note Telephone: [phone]  Fax: [phone]  PATIENT NAME: Chad [name] DOB: [birth year] MRN: [id number]  PRIMARY CARE PROVIDER:  [facility] RESPONSIBLE PARTY:Self  RECOMMENDATIONS and PLAN: 1.ACP: DNR; treat what is treatable including hospitalizations if necessary  2.Anorexia secondary to protein calorie malnutritionrelated to cancerimprovingongoing.Continue to monitor daily weights, supplements, supportive measures and courage to eat  3.Chronic pain monitor on pain scale, monitor efficacy vs adverse side effects.Will continue to monitor; encouraged to ask when in pain, Continue to follow by psychotherapy and psychiatry for counseling; supportive services.  4.Palliative care encounter;Palliative medicine team will continue to support patient, patient's family, and medical team. Visit consisted of counseling and education dealing with the complex and emotionally intense issues of symptom management and palliative care in the setting of serious and potentially life-threatening illness  5. F/u 1 months for ongoing support, monitoring pain, weight, disease progression   I spent 35 minutes providing this consultation, starting at 10:00am. More than 50% of the time in this consultation was spent coordinating communication.   HISTORY OF PRESENT ILLNESS:  Trayce Maino is a 60 y.o. year old male with multiple medical problems including lateonset CVA, urothelial cancer, prostate cancer, chronic hepatitis C, hyperlipidemia, colon surgery, protein calorie malnutrition, anxiety, depression.The [place] continues to reside in [place] at [facility] Mr Rosenberg does transfer himself to the wheelchair where he is mobile. Mr Coger does continue to require assistance with bathing and dressing. Mr Bond does feed himself. Current weight 159.6  lbs with BMI 25. No recent falls, infections, hospitalizations. Mr Marzette continues to get Oxycodone 10 mg 6 hours for pain is needed with relief. Mr Brew continues to be followed by wound care for multiple wounds at the facility. Staff endorses new new changes or concerns. I visited and observed Mr Russomanno . We talked about purpose of Palliative care visit. Mr Tracz in agreement. We talked about how he has been feeling today. Mr Baack endorses that he is doing okay sleepy. Mr Godsey is ready for his nap. Mr Quiroa currently deny symptoms of pain or shortness of breath. We talked about appetite. We talked about last Oncology visit. We talked about the new room Mr Burdo was transferred to. We talked about facility briefly. Therapeutic listening and emotional support provided. Mr Kassel endorses he was tired and ready to return to his nap. Medical goals reviewed. I updated staffing any changes to current goals or plan of care.  Mr. 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 JAN 5th, 2022- CT findings worrisome for progressive right renal pelvis and ureteral tumor. There is also progressive right renal cortical thinning, decreased perfusion and hydronephrosis; and mass suspicious for colonic neoplasm involving the descending colon sigmoid colon junction region.  Mild soft tissue thickening around the distal right ureter could reflect ureteral tumor-see discussion below. currently awaiting a PET scan.    # HOLD keytruda today; Labs today reviewed;  Given wound healing issues. Await PET scan and biopsy consideration-for NGS.  Otherwise patient will be candidate for antibody drug conjugate therapy. Will discuss at tumor conference.  #Descending colon mass/intermittent constipation-no evidence of obstruction.-history of  Lynch syndrome.  Await PET scan patient on 2/04; will need repeat colonoscopy. Discussed with Dr.Anna; appt on 02/01.   # Iron deficiency anemia-hemoglobin  12.0- STABLE.    # Bilateral LE ulcers-s/p wound care evaluation; s/p PTCA with Dr.Dew. bil Arterial Dopp-wnl. Continue wound care/antibiotics.     Palliative Care was asked to help to continue to address goals of care.   CODE STATUS: DNR  PPS: 50% HOSPICE ELIGIBILITY/DIAGNOSIS: TBD  PAST MEDICAL HISTORY:  Past Medical History:  Diagnosis Date  . Anemia   . Anxiety   . ARF (acute respiratory failure) ([place])   . Bladder cancer ([place])   . COPD (chronic obstructive pulmonary disease) ([place])   . Depression   . Dysphagia   . Family history of colon cancer   . Family history of kidney cancer   . Family history of leukemia   . Family history of prostate cancer   . GERD (gastroesophageal reflux disease)   . Hepatitis    chronic hep c  . Hydronephrosis   . Hydronephrosis with ureteral stricture   . Hyperlipidemia   . Knee pain    Left  . Malignant neoplasm of colon ([place])   . Nerve pain   . Peripheral vascular disease ([place])   . Prostate cancer ([place])   . Stroke ([place])   . Urinary frequency   . Venous hypertension of both lower extremities     SOCIAL HX:  Social History   Tobacco Use  . Smoking status: Current Every Day Smoker    Packs/day: 1.00    Types: Cigarettes  . Smokeless tobacco: Never Used  Substance Use Topics  . Alcohol use: Not Currently    ALLERGIES:  Allergies  Allergen Reactions  . Penicillins Rash       PHYSICAL EXAM:   General: debilitated, chronically ill, pleasant male Cardiovascular: regular rate and rhythm Pulmonary: clear ant fields Neurological: w/c dependent  Debbie Bellucci Ihor Gully, NP

## 2020-09-17 ENCOUNTER — Inpatient Hospital Stay: Payer: Medicaid Other | Attending: Internal Medicine

## 2020-09-17 ENCOUNTER — Other Ambulatory Visit: Payer: Self-pay | Admitting: *Deleted

## 2020-09-17 ENCOUNTER — Other Ambulatory Visit: Payer: Self-pay

## 2020-09-17 ENCOUNTER — Inpatient Hospital Stay (HOSPITAL_BASED_OUTPATIENT_CLINIC_OR_DEPARTMENT_OTHER): Payer: Medicaid Other | Admitting: Internal Medicine

## 2020-09-17 ENCOUNTER — Telehealth: Payer: Self-pay

## 2020-09-17 ENCOUNTER — Inpatient Hospital Stay: Payer: Medicaid Other

## 2020-09-17 VITALS — Resp 20

## 2020-09-17 VITALS — BP 94/71 | HR 85 | Temp 97.7°F

## 2020-09-17 DIAGNOSIS — Z7189 Other specified counseling: Secondary | ICD-10-CM

## 2020-09-17 DIAGNOSIS — L97929 Non-pressure chronic ulcer of unspecified part of left lower leg with unspecified severity: Secondary | ICD-10-CM | POA: Insufficient documentation

## 2020-09-17 DIAGNOSIS — Z1509 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: Secondary | ICD-10-CM | POA: Insufficient documentation

## 2020-09-17 DIAGNOSIS — R933 Abnormal findings on diagnostic imaging of other parts of digestive tract: Secondary | ICD-10-CM | POA: Diagnosis not present

## 2020-09-17 DIAGNOSIS — D509 Iron deficiency anemia, unspecified: Secondary | ICD-10-CM | POA: Diagnosis not present

## 2020-09-17 DIAGNOSIS — C661 Malignant neoplasm of right ureter: Secondary | ICD-10-CM

## 2020-09-17 DIAGNOSIS — Z8042 Family history of malignant neoplasm of prostate: Secondary | ICD-10-CM | POA: Diagnosis not present

## 2020-09-17 DIAGNOSIS — Z5112 Encounter for antineoplastic immunotherapy: Secondary | ICD-10-CM | POA: Diagnosis not present

## 2020-09-17 DIAGNOSIS — L97919 Non-pressure chronic ulcer of unspecified part of right lower leg with unspecified severity: Secondary | ICD-10-CM | POA: Diagnosis not present

## 2020-09-17 DIAGNOSIS — C182 Malignant neoplasm of ascending colon: Secondary | ICD-10-CM

## 2020-09-17 DIAGNOSIS — F17210 Nicotine dependence, cigarettes, uncomplicated: Secondary | ICD-10-CM | POA: Diagnosis not present

## 2020-09-17 DIAGNOSIS — Z8051 Family history of malignant neoplasm of kidney: Secondary | ICD-10-CM | POA: Diagnosis not present

## 2020-09-17 DIAGNOSIS — Z515 Encounter for palliative care: Secondary | ICD-10-CM

## 2020-09-17 DIAGNOSIS — Z809 Family history of malignant neoplasm, unspecified: Secondary | ICD-10-CM | POA: Insufficient documentation

## 2020-09-17 DIAGNOSIS — Z85038 Personal history of other malignant neoplasm of large intestine: Secondary | ICD-10-CM | POA: Diagnosis not present

## 2020-09-17 DIAGNOSIS — K6389 Other specified diseases of intestine: Secondary | ICD-10-CM | POA: Insufficient documentation

## 2020-09-17 LAB — CBC WITH DIFFERENTIAL/PLATELET
Abs Immature Granulocytes: 0.02 10*3/uL (ref 0.00–0.07)
Basophils Absolute: 0.1 10*3/uL (ref 0.0–0.1)
Basophils Relative: 1 %
Eosinophils Absolute: 0.3 10*3/uL (ref 0.0–0.5)
Eosinophils Relative: 5 %
HCT: 36.7 % — ABNORMAL LOW (ref 39.0–52.0)
Hemoglobin: 11.4 g/dL — ABNORMAL LOW (ref 13.0–17.0)
Immature Granulocytes: 0 %
Lymphocytes Relative: 19 %
Lymphs Abs: 1.3 10*3/uL (ref 0.7–4.0)
MCH: 23.2 pg — ABNORMAL LOW (ref 26.0–34.0)
MCHC: 31.1 g/dL (ref 30.0–36.0)
MCV: 74.6 fL — ABNORMAL LOW (ref 80.0–100.0)
Monocytes Absolute: 0.5 10*3/uL (ref 0.1–1.0)
Monocytes Relative: 7 %
Neutro Abs: 4.6 10*3/uL (ref 1.7–7.7)
Neutrophils Relative %: 68 %
Platelets: 200 10*3/uL (ref 150–400)
RBC: 4.92 MIL/uL (ref 4.22–5.81)
RDW: 15.9 % — ABNORMAL HIGH (ref 11.5–15.5)
WBC: 6.7 10*3/uL (ref 4.0–10.5)
nRBC: 0 % (ref 0.0–0.2)

## 2020-09-17 LAB — COMPREHENSIVE METABOLIC PANEL
ALT: 22 U/L (ref 0–44)
AST: 37 U/L (ref 15–41)
Albumin: 3.3 g/dL — ABNORMAL LOW (ref 3.5–5.0)
Alkaline Phosphatase: 102 U/L (ref 38–126)
Anion gap: 10 (ref 5–15)
BUN: 17 mg/dL (ref 8–23)
CO2: 24 mmol/L (ref 22–32)
Calcium: 8.5 mg/dL — ABNORMAL LOW (ref 8.9–10.3)
Chloride: 102 mmol/L (ref 98–111)
Creatinine, Ser: 0.83 mg/dL (ref 0.61–1.24)
GFR, Estimated: >60 mL/min (ref >60)
Glucose, Bld: 149 mg/dL — ABNORMAL HIGH (ref 70–99)
Potassium: 3.9 mmol/L (ref 3.5–5.1)
Sodium: 136 mmol/L (ref 135–145)
Total Bilirubin: 0.5 mg/dL (ref 0.3–1.2)
Total Protein: 7.4 g/dL (ref 6.5–8.1)

## 2020-09-17 LAB — IRON AND TIBC
Iron: 24 ug/dL — ABNORMAL LOW (ref 45–182)
Saturation Ratios: 6 % — ABNORMAL LOW (ref 17.9–39.5)
TIBC: 409 ug/dL (ref 250–450)
UIBC: 385 ug/dL

## 2020-09-17 LAB — FERRITIN: Ferritin: 14 ng/mL — ABNORMAL LOW (ref 24–336)

## 2020-09-17 MED ORDER — SODIUM CHLORIDE 0.9 % IV SOLN
Freq: Once | INTRAVENOUS | Status: AC
  Filled 2020-09-17: qty 250

## 2020-09-17 MED ORDER — SODIUM CHLORIDE 0.9% FLUSH
10.0000 mL | INTRAVENOUS | Status: DC | PRN
  Administered 2020-09-17: 10 mL via INTRAVENOUS
  Filled 2020-09-17: qty 10

## 2020-09-17 MED ORDER — HEPARIN SOD (PORK) LOCK FLUSH 100 UNIT/ML IV SOLN
500.0000 [IU] | Freq: Once | INTRAVENOUS | Status: AC
  Administered 2020-09-17: 500 [IU] via INTRAVENOUS
  Filled 2020-09-17: qty 5

## 2020-09-17 MED ORDER — SODIUM CHLORIDE 0.9 % IV SOLN
200.0000 mg | Freq: Once | INTRAVENOUS | Status: AC
  Administered 2020-09-17: 200 mg via INTRAVENOUS
  Filled 2020-09-17: qty 8

## 2020-09-17 MED ORDER — HEPARIN SOD (PORK) LOCK FLUSH 100 UNIT/ML IV SOLN
INTRAVENOUS | Status: AC
  Filled 2020-09-17: qty 5

## 2020-09-17 NOTE — Progress Notes (Signed)
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  Is alone.  Chad [name] 60 y.o.  male above history of stage IV-ureteral cancer/with multiple co-morbidities currently on Keytruda is here for follow-up/reveals a PET scan.  In the interim patient has been evaluated by GI for his colon mass.  Awaiting colonoscopy on the 24th.  He also follows up with wound care for his bilateral extremity wounds-healing.  Not any worse.  No nausea no vomiting.  No diarrhea.  No blood in stools or black or stools.  Review of Systems  Constitutional: Positive for malaise/fatigue. Negative for chills, diaphoresis, fever and weight loss.  HENT: Negative for nosebleeds and sore throat.   Eyes: Negative for double vision.  Respiratory: Positive for cough and shortness of breath. Negative for hemoptysis and wheezing.   Cardiovascular: Negative for chest pain, palpitations and orthopnea.  Gastrointestinal: Positive for constipation.  Negative for abdominal pain, blood in stool, diarrhea, heartburn, melena, nausea and vomiting.  Genitourinary: Negative for dysuria, frequency and urgency.  Musculoskeletal: Positive for back pain and joint pain.  Skin: Negative.  Negative for itching and rash.  Neurological: Positive for focal weakness. Negative for dizziness, tingling, weakness and headaches.       Chronic left-sided weakness upper than lower extremity.  Endo/Heme/Allergies: Does not bruise/bleed easily.  Psychiatric/Behavioral: Negative for depression. The patient is not nervous/anxious and does not have insomnia.      MEDICAL HISTORY:  Past Medical History:  Diagnosis Date  . Anemia   . Anxiety   . ARF (acute respiratory failure) ([place])   . Bladder cancer ([place])   . COPD (chronic obstructive pulmonary disease) ([place])   . Depression   . Dysphagia   . Family history of colon cancer   . Family history of kidney cancer   . Family history of leukemia   . Family history of prostate cancer   . GERD (gastroesophageal reflux disease)   . Hepatitis    chronic hep c  . Hydronephrosis   . Hydronephrosis with ureteral stricture   . Hyperlipidemia   . Knee pain    Left  . Malignant neoplasm of colon ([place])   . Nerve pain   . Peripheral vascular disease ([place])   . Prostate cancer ([place])   . Stroke ([place])   . Urinary frequency   . Venous hypertension of both lower extremities     SURGICAL HISTORY: Past Surgical History:  Procedure Laterality Date  . COLON SURGERY     En  bloc extended right hemicolectomy 07/2017  . CYSTOSCOPY W/ RETROGRADES Right 08/30/2018   Procedure: CYSTOSCOPY WITH RETROGRADE PYELOGRAM;  Surgeon: Hollice Espy, MD;  Location: [facility] ORS;  Service: Urology;  Laterality: Right;  . CYSTOSCOPY WITH STENT PLACEMENT Right 04/25/2018   Procedure: CYSTOSCOPY WITH STENT PLACEMENT;  Surgeon: Hollice Espy, MD;  Location: [facility];  Service: Urology;  Laterality: Right;  . CYSTOSCOPY WITH STENT PLACEMENT Right  08/30/2018   Procedure: [place] WITH STENT Exchange;  Surgeon: Hollice Espy, MD;  Location: [facility];  Service: Urology;  Laterality: Right;  . CYSTOSCOPY WITH STENT PLACEMENT Right 03/07/2019   Procedure: CYSTOSCOPY WITH STENT Exchange;  Surgeon: Hollice Espy, MD;  Location: [facility];  Service: Urology;  Laterality: Right;  . CYSTOSCOPY WITH STENT PLACEMENT Right 11/21/2019   Procedure: CYSTOSCOPY WITH STENT Exchange;  Surgeon: Hollice Espy, MD;  Location: [facility];  Service: Urology;  Laterality: Right;  . LOWER EXTREMITY ANGIOGRAPHY Left 05/23/2019   Procedure: LOWER EXTREMITY ANGIOGRAPHY;  Surgeon: Algernon Huxley, MD;  Location: [facility];  Service: Cardiovascular;  Laterality: Left;  . LOWER EXTREMITY ANGIOGRAPHY Right 05/30/2019   Procedure: LOWER EXTREMITY ANGIOGRAPHY;  Surgeon: Algernon Huxley, MD;  Location: [facility];  Service: Cardiovascular;  Laterality: Right;  . LOWER EXTREMITY ANGIOGRAPHY Right 02/13/2020   Procedure: LOWER EXTREMITY ANGIOGRAPHY;  Surgeon: Algernon Huxley, MD;  Location: [facility];  Service: Cardiovascular;  Laterality: Right;  . LOWER EXTREMITY ANGIOGRAPHY Left 02/20/2020   Procedure: LOWER EXTREMITY ANGIOGRAPHY;  Surgeon: Algernon Huxley, MD;  Location: [facility] [place] [facility];  Service: Cardiovascular;  Laterality: Left;  . PORTA CATH INSERTION N/A 02/28/2019   Procedure: PORTA CATH INSERTION;  Surgeon: Algernon Huxley, MD;  Location: [facility];  Service: Cardiovascular;  Laterality: N/A;  . tumor removed       SOCIAL HISTORY: Social History   Socioeconomic History  . Marital status: Single    Spouse name: Not on file  . Number of children: Not on file  . Years of education: Not on file  . Highest education level: Not on file  Occupational History  . Not on file  Tobacco Use  . Smoking status: Current Every Day Smoker    Packs/day: 1.00    Types: Cigarettes  . Smokeless tobacco: Never Used  Vaping Use  . Vaping Use:  Never used  Substance and Sexual Activity  . Alcohol use: Not Currently  . Drug use: Not Currently  . Sexual activity: Not Currently  Other Topics Concern  . Not on file  Social History Narrative    used to live [place]; moved  To [place]- end of April 2019; in Nursing home; 1pp/day; quit alcohol. 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        Family- dad- prostate ca [at 53y]; brother- 64 died of prostate cancer; brother- 15- no cancers [[place]]; sonGerald Stabs [[place]];[name]-32y prostate ca [facility] [place]]; daughter- 73 [[place]]; another daughter 43 [[place]/addict]. will refer genetics counseling. Given MSI- abnormal; highly suspicious of Lynch syndrome.  Patient's son [name] aware of high possible lynch syndrome.   Social Determinants of Health   Financial Resource Strain: Not on file  Food Insecurity: Not on file  Transportation Needs: Not on file  Physical Activity: Not on file  Stress: Not on file  Social Connections: Not on file  Intimate Partner Violence: Not on file    FAMILY HISTORY: Family History  Problem Relation Age of Onset  .  Prostate cancer Father 37  . Cancer Brother 64       unsure type  . Cancer Paternal Uncle        unsure type  . Cancer Maternal Grandmother        unsure type  . Cancer Paternal Grandmother        unsure type  . Kidney cancer Paternal Grandfather   . Cancer Other        unsure types  . Leukemia Son   . Cancer Son        other cancers, possibly colon    ALLERGIES:  is allergic to penicillins.  MEDICATIONS:  Current Outpatient Medications  Medication Sig Dispense Refill  . acetaminophen (TYLENOL) 325 MG tablet Take 650 mg by mouth 3 (three) times daily.     Marland Kitchen aspirin EC 81 MG tablet Take 1 tablet (81 mg total) by mouth daily. 150 tablet 2  . Cholecalciferol (VITAMIN D) 50 MCG (2000 UT) CAPS Take by mouth.    . clopidogrel (PLAVIX) 75 MG tablet Take 1 tablet (75 mg total) by mouth daily. 30 tablet 11  . cyclobenzaprine (FLEXERIL) 10 MG tablet Take 10 mg by  mouth at bedtime.    . diclofenac sodium (VOLTAREN) 1 % GEL Apply 2 g topically See admin instructions. Apply 2 g topically 4 times daily. Apply 2 g to left shoulder, apply 2 g to left hip and 2 g to left knee 4 times daily per [facility] instructions    . escitalopram (LEXAPRO) 5 MG tablet Take 5 mg by mouth daily.    Marland Kitchen gabapentin (NEURONTIN) 100 MG capsule Take 100 mg by mouth 3 (three) times daily.    . hydroxypropyl methylcellulose / hypromellose (ISOPTO TEARS / GONIOVISC) 2.5 % ophthalmic solution Place 1 drop into both eyes every 12 (twelve) hours.    . mirtazapine (REMERON) 7.5 MG tablet Take 7.5 mg by mouth at bedtime.    . Oxycodone HCl 10 MG TABS Take 10 mg by mouth every 6 (six) hours as needed for pain.    . pantoprazole (PROTONIX) 40 MG tablet Take 40 mg by mouth daily.    . Pollen Extracts (PROSTAT PO) Take 30 mLs by mouth.    . pravastatin (PRAVACHOL) 10 MG tablet Take 10 mg by mouth daily.    . RESTORE CALCIUM ALGINATE EX Apply topically. Note: Dressing located on left ankle. Facility cleaning wound "every day shift and applying wound cleanser and appication of calcium Alginate and cover with dry dressing and wrapped with Kerlix"    . atorvastatin (LIPITOR) 10 MG tablet Take 1 tablet (10 mg total) by mouth daily. 30 tablet 11  . ciprofloxacin (CIPRO) 500 MG tablet Take 500 mg by mouth 2 (two) times daily. (Patient not taking: Reported on 09/17/2020)     No current facility-administered medications for this visit.   Facility-Administered Medications Ordered in Other Visits  Medication Dose Route Frequency Provider Last Rate Last Admin  . sodium chloride flush (NS) 0.9 % injection 10 mL  10 mL Intravenous PRN Cammie Sickle, MD   10 mL at 09/17/20 0925      .  PHYSICAL EXAMINATION: ECOG PERFORMANCE STATUS: 1 - Symptomatic but completely ambulatory  Vitals:   09/17/20 0936  BP: 94/71  Pulse: 85  Temp: 97.7 F (36.5 C)   Filed Weights    Physical Exam Constitutional:       Comments: In wheelchair. Cachectic.  Appears older than his stated age.  Alone.  HENT:  Head: Normocephalic and atraumatic.     Mouth/Throat:     Pharynx: No oropharyngeal exudate.  Eyes:     Pupils: Pupils are equal, round, and reactive to light.  Cardiovascular:     Rate and Rhythm: Normal rate and regular rhythm.  Pulmonary:     Effort: No respiratory distress.     Breath sounds: No wheezing.  Abdominal:     General: Bowel sounds are normal. There is no distension.     Palpations: Abdomen is soft. There is no mass.     Tenderness: There is no abdominal tenderness. There is no guarding or rebound.  Musculoskeletal:        General: No tenderness. Normal range of motion.     Cervical back: Normal range of motion and neck supple.  Skin:    General: Skin is warm.     Comments: Bilateral lower extremity ulcerations noted.  Pulses intact bilaterally.  Neurological:     Mental Status: He is oriented to person, place, and time.     Comments: Sleepy but easily arousable.  Chronic weakness left upper and lower extremity.  Psychiatric:        Mood and Affect: Affect normal.      LABORATORY DATA:  I have reviewed the data as listed Lab Results  Component Value Date   WBC 6.7 09/17/2020   HGB 11.4 (L) 09/17/2020   HCT 36.7 (L) 09/17/2020   MCV 74.6 (L) 09/17/2020   PLT 200 09/17/2020   Recent Labs    03/08/20 0905 03/29/20 0824 04/19/20 0858 05/10/20 0915 08/06/20 0832 08/27/20 0946 09/17/20 0925  NA 137 139 134*   < > 136 132* 136  K 4.4 4.1 3.8   < > 4.5 3.8 3.9  CL 103 104 101   < > 103 102 102  CO2 '25 24 26   ' < > 21* 22 24  GLUCOSE 88 94 140*   < > 92 120* 149*  BUN '18 21 19   ' < > '19 16 17  ' CREATININE 0.85 0.76 0.92   < > 0.91 0.97 0.83  CALCIUM 8.8* 8.6* 8.4*   < > 8.6* 8.6* 8.5*  GFRNONAA >60 >60 >60   < > >60 >60 >60  GFRAA >60 >60 >60  --   --   --   --   PROT 8.0 7.7 7.8   < > 8.0 8.1 7.4  ALBUMIN 3.8 3.6 3.7   < > 3.4* 3.5 3.3*  AST 32 28 33   < >  32 29 37  ALT '26 20 24   ' < > '18 18 22  ' ALKPHOS 127* 118 115   < > 99 88 102  BILITOT 0.6 0.4 0.4   < > 0.6 0.7 0.5   < > = values in this interval not displayed.    RADIOGRAPHIC STUDIES: I have personally reviewed the radiological images as listed and agreed with the findings in the report. NM PET Image Restag (PS) Skull Base To Thigh  Result Date: 08/30/2020 CLINICAL DATA:  Subsequent treatment strategy for urologic cancer. Prostate and bladder cancer. Immunotherapy ongoing. EXAM: NUCLEAR MEDICINE PET SKULL BASE TO THIGH TECHNIQUE: 8.5 mCi F-18 FDG was injected intravenously. Full-ring PET imaging was performed from the skull base to thigh after the radiotracer. CT data was obtained and used for attenuation correction and anatomic localization. Fasting blood glucose: 88 mg/dl COMPARISON:  CT 08/01/2020 com PET-CT 05/31/2018 FINDINGS: Mediastinal blood pool activity: SUV max 2.6 Liver activity: SUV  max NA NECK: No hypermetabolic adenopathy in the neck. Physiologic activity associated perispinal neck musculature. Incidental CT findings: none CHEST: No hypermetabolic mediastinal or hilar nodes. No suspicious pulmonary nodules on the CT scan. Intense metabolic activity associated with a RIGHT axial lymph node with SUV max equal 6.4. This is not changed from exam from 2019 with SUV max equal 6.8. Incidental CT findings: Port in the anterior chest wall with tip in distal SVC. ABDOMEN/PELVIS: The RIGHT renal pelvis and bladder are not well evaluated due to intense radiotracer activity within excreted urine. There is a double-J ureteral stent on the RIGHT. There are no hypermetabolic abdominal or pelvic lymph nodes. No abnormal activity liver. There is a concentric focus of intense metabolic activity associated with the proximal sigmoid colon with SUV max 6.3. There is low attenuation thickening through this region on most recent CT scan from 06/01/2021. On more or more remote PET-CT scan from 05/31/2018 there  was hypermetabolic activity through this region in a similar pattern. There is thickening through this region on CT from 08/19/2018. Incidental CT findings: none SKELETON: No focal hypermetabolic activity to suggest skeletal metastasis. Incidental CT findings: none IMPRESSION: 1. RIGHT renal pelvis and bladder are not evaluable by FDG PET imaging due to high activity of radiotracer within the urine. 2. There is no evidence of metastatic bladder cancer on skull base to thigh FDG PET scan. 3. Hypermetabolic crescentic mural thickening within the proximal sigmoid colon which is stable over multiple comparison exams. Findings would favor a nonaggressive neoplasm. Recommend correlation with endoscopy. 4. No evidence skeletal metastasis. 5. Persistent intense metabolic activity associated with enlarged RIGHT axial lymph node. No interval change from PET-CT scan 05/31/2018. Undetermined etiology. Electronically Signed   By: Suzy Bouchard M.D.   On: 08/30/2020 17:25    ASSESSMENT & PLAN:   Ureteral cancer, right ([place]) # High-grade urothelial cancer/cytology; likely of the right renal pelvis /upper ureter. JAN 5th, 2022- CT findings worrisome for progressive right renal pelvis and ureteral tumor and mass suspicious for colonic neoplasm involving the descending colon sigmoid colon junction region [see below].  Mild soft tissue thickening around the distal right ureter could reflect ureteral tumor; FEB 2022-  RIGHT renal pelvis and bladder are not evaluable by FDG PET imaging due to high activity of radiotracer within the urine.  #  # proceed with  [place] today; Labs today reviewed; Labs today reviewed;  acceptable for treatment today.   #Descending colon mass/intermittent constipation-PET scan- FEB 2022-uptake in the cecum;  [history of Lynch syndrome].  Awaiting colonoscopy on 2/24.    # Iron deficiency anemia-hemoglobin 11.4; February 2022 ron studies/ferritin-LOW; recommend plan for next visit  #  Bilateral LE ulcers-s/p wound care evaluation; s/p PTCA with Dr.Dew. bil Arterial Dopp-wnl; STABLE [Dr.]  #DISPOSITION: #  [name] today;  # follow up in 3 weeks-;MD labs-cbc/cmp;  Keytruda; venofer- Dr.B    All questions were answered. The patient knows to call the clinic with any problems, questions or concerns.    Cammie Sickle, MD 09/17/2020 1:18 PM

## 2020-09-17 NOTE — Assessment & Plan Note (Addendum)
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 JAN 5th, 2022- CT findings worrisome for progressive right renal pelvis and ureteral tumor and mass suspicious for colonic neoplasm involving the descending colon sigmoid colon junction region [see below].  Mild soft tissue thickening around the distal right ureter could reflect ureteral tumor; FEB 2022-  RIGHT renal pelvis and bladder are not evaluable by FDG PET imaging due to high activity of radiotracer within the urine.  #  # proceed with  [place] today; Labs today reviewed; Labs today reviewed;  acceptable for treatment today.   #Descending colon mass/intermittent constipation-PET scan- FEB 2022-uptake in the cecum;  [history of Lynch syndrome].  Awaiting colonoscopy on 2/24.    # Iron deficiency anemia-hemoglobin 11.4; February 2022 ron studies/ferritin-LOW; recommend plan for next visit  # Bilateral LE ulcers-s/p wound care evaluation; s/p PTCA with Dr.Dew. bil Arterial Dopp-wnl; STABLE [Dr.]  #DISPOSITION: #  [name] today;  # follow up in 3 weeks-;MD labs-cbc/cmp;  Keytruda; venofer- Dr.B

## 2020-09-17 NOTE — Telephone Encounter (Signed)
Returned call to patients care facility to explain prep instructions. Spoke with caregiver and Arbie Cookey verbalized understanding.

## 2020-09-18 ENCOUNTER — Other Ambulatory Visit
Admission: RE | Admit: 2020-09-18 | Discharge: 2020-09-18 | Disposition: A | Discharge status: Home / Self Care | Payer: Medicaid Other | Source: Ambulatory Visit | Attending: Gastroenterology | Admitting: Gastroenterology

## 2020-09-18 ENCOUNTER — Encounter: Payer: Medicaid Other | Admitting: Physician Assistant

## 2020-09-18 DIAGNOSIS — Z20822 Contact with and (suspected) exposure to covid-19: Secondary | ICD-10-CM | POA: Diagnosis not present

## 2020-09-18 DIAGNOSIS — Z01812 Encounter for preprocedural laboratory examination: Secondary | ICD-10-CM | POA: Insufficient documentation

## 2020-09-18 DIAGNOSIS — L97512 Non-pressure chronic ulcer of other part of right foot with fat layer exposed: Secondary | ICD-10-CM | POA: Diagnosis not present

## 2020-09-18 LAB — SARS CORONAVIRUS 2 (TAT 6-24 HRS): SARS Coronavirus 2: NEGATIVE

## 2020-09-18 LAB — CEA: CEA: 2.1 ng/mL (ref 0.0–4.7)

## 2020-09-18 NOTE — Progress Notes (Addendum)
AMELIO, BROSKY ([id number]) Visit Report for 09/18/2020 Chief Complaint Document Details Patient Name: Chad [name], Chad [name] Date of Service: 09/18/2020 9:00 AM Medical Record Number: [id number] Patient Account Number: [id number] Date of Birth/Sex: 04-19-1961 (60 y.o. M) Treating RN: Dolan Amen Primary Care Provider: Clayborn Bigness Other Clinician: Referring Provider: Clayborn Bigness Treating Provider/Extender: Skipper Cliche in Treatment: 5 Information Obtained from: Patient Chief Complaint Left ankle and right foot and heel ulcers Electronic Signature(s) Signed: 09/18/2020 8:22:07 AM By: Worthy Keeler PA-C Entered By: Worthy Keeler on 09/18/2020 08:22:07 Chad [name] ([id number]) -------------------------------------------------------------------------------- HPI Details Patient Name: Chad [name] Date of Service: 09/18/2020 9:00 AM Medical Record Number: [id number] Patient Account Number: [id number] Date of Birth/Sex: May 25, 1961 (60 y.o. M) Treating RN: Dolan Amen Primary Care Provider: Clayborn Bigness Other Clinician: Referring Provider: Clayborn Bigness Treating Provider/Extender: Skipper Cliche in Treatment: 5 History of Present Illness HPI Description: 10/08/18 on evaluation today patient actually presents to our office for initial evaluation concerning wounds that he has of the bilateral lower extremities. 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 He also has bilateral venous stasis. He apparently has been homeless currently following discharge from the hospital apparently he has been placed at [facility] which is is a skilled nursing facility locally. Nonetheless fortunately he does not show any signs of infection at this time which is good news. In fact several of the wound actually appears to be showing some  signs of improvement already in my pinion. There are a couple areas in the left leg in particular there likely gonna require some sharp debridement to help clear away some necrotic tissue and help with more sufficient healing. No fevers, chills, nausea, or vomiting noted at this time. 10/15/18 on evaluation today patient actually appears to be doing very well in regard to his bilateral lower extremities. He's been tolerating the dressing changes without complication. Fortunately there does not appear to be any evidence of active infection at this time which is great news. Overall I'm actually very pleased with how this has progressed in just one visits time. Readmission: 08/14/2020 upon evaluation today patient presents for re-evaluation here in our clinic. He is having issues with his left ankle region as well as his right toe and his right heel. He tells me that the toe and heel actually began as a area that was itching that he was scratching and then subsequently opened up into wounds. These may have been abscess areas I presume based on what I am seeing currently. With regard to his left ankle region he tells me this was a similar type occurrence although he does have venous stasis this very well may be more of a venous leg ulcer more than anything. Nonetheless I do believe that the patient would benefit from appropriate and aggressive wound care to try to help get things under better control here. He does have history of a stroke on the left side affecting him to some degree there that he is able to stand although he does have some residual weakness. Otherwise again the patient does have chronic venous insufficiency as previously noted. His arterial studies most recently obtained showed that he had an ABI on the right of 1.16 with a TBI of 0.52 and on the left and ABI of 1.14 with a TBI of 0.81. That was obtained on 06/19/2020. 08/28/2020 upon evaluation today  patient appears to be doing decently  well in regard to his wounds in general. He has been tolerating the dressing changes without complication. Fortunately there does not appear to be any signs of active infection which is great news. With that being said I think the [facility] is doing a good job I would recommend that we likely continue with that currently. 09/11/2020 upon evaluation today patient's wounds did not appear to be doing too poorly but again he is not really showing signs of significant improvement with regard to any of the wounds on the right. None of them have Hydrofera Blue on them I am not exactly sure why this is not being followed as the facility did not contact us to let us know of any issues with obtaining dressings or otherwise. With that being said he is supposed to be using Hydrofera Blue on both of the wounds on the right foot as well as the ankle wound on the left side. 09/18/2020 upon evaluation today patient appears to be doing poorly with regard to his wounds. Again right now the left ankle in particular showed signs of extreme maceration. Apparently he was told by someone with staff at [facility] they could not get the [facility]. With that being said this is something that is never been relayed to [place] one way or another. Also the patient subsequently has not supposed to have a border gauze dressing on. He should have an ABD pad and roll gauze to secure as this drains much too much just to have a border gauze dressing to cover. Nonetheless the fact that they are not using the appropriate dressing is directly causing deterioration of the left ankle wound it is significantly worse today compared to what it was previous. I did attempt to call [facility] while the patient was here I called three times and got no one to even pick up the phone. After this I had my for an office coordinator call and she was able to finally get through and leave a message with the D ON as of dictation of this  note which is roughly about an hour and a half later I still have not been able to speak with anyone at the facility. Electronic Signature(s) Signed: 09/18/2020 10:09:33 AM By: Worthy Keeler PA-C Entered By: Worthy Keeler on 09/18/2020 10:09:33 Chad [name] ([id number]) -------------------------------------------------------------------------------- Physical Exam Details Patient Name: Chad [name] Date of Service: 09/18/2020 9:00 AM Medical Record Number: [id number] Patient Account Number: [id number] Date of Birth/Sex: Dec 25, 1960 (59 y.o. M) Treating RN: Dolan Amen Primary Care Provider: Clayborn Bigness Other Clinician: Referring Provider: Clayborn Bigness Treating Provider/Extender: Jeri Cos Weeks in Treatment: 5 Constitutional Well-nourished and well-hydrated in no acute distress. Respiratory normal breathing without difficulty. Psychiatric this patient is able to make decisions and demonstrates good insight into disease process. Alert and Oriented x 3. pleasant and cooperative. Notes Upon inspection patient's wounds on the toe and heel area appear to be doing okay. I think were probably ready to switch over to collagen here. However the left ankle region is doing in my opinion significantly worse due to breakdown secondary to maceration in the adhesive dressing. Again we called him last time he was seen here a week ago and let the nursing staff know that this was not supposed to be an adhesive dressing that he should have an ABD pad and roll gauze to secure. With that being said the patient unfortunately still states that they have continue to put  a large Band-Aid type dressing over this area such as a Marriott. He tells me that he mentioned something to them one time about this and they told him that that is what they begin to be doing. Again I have try to contact someone today including the DME and I have not heard back from them yet but as soon as I do I will discuss the  situation as well. Electronic Signature(s) Signed: 09/18/2020 10:11:58 AM By: Worthy Keeler PA-C Entered By: Worthy Keeler on 09/18/2020 10:11:58 Chad [name] ([id number]) -------------------------------------------------------------------------------- Physician Orders Details Patient Name: Chad [name] Date of Service: 09/18/2020 9:00 AM Medical Record Number: [id number] Patient Account Number: [id number] Date of Birth/Sex: 10/10/1960 (59 y.o. 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 Wound #7 Right Calcaneus o Return Appointment in 1 week. Wound #8 Left,Medial Ankle o Return Appointment in 1 week. Bathing/ Shower/ Hygiene Wound #6 Right Toe Second o Clean wound with Normal Saline or wound cleanser. o May shower; gently cleanse wound with antibacterial soap, rinse and pat dry prior to dressing wounds Wound #7 Right Calcaneus o Clean wound with Normal Saline or wound cleanser. o May shower; gently cleanse wound with antibacterial soap, rinse and pat dry prior to dressing wounds Wound #8 Left,Medial Ankle o Clean wound with Normal Saline or wound cleanser. o May shower; gently cleanse wound with antibacterial soap, rinse and pat dry prior to dressing wounds Wound Treatment Wound #6 -  Toe Second Wound Laterality: Right Cleanser: Normal Saline 1 x Per Day/30 Days Discharge Instructions: Wash your hands with soap and water. Remove old dressing, discard into plastic bag and place into trash. Cleanse the wound with Normal Saline prior to applying a clean dressing using gauze sponges, not tissues or cotton balls. Do not scrub or use excessive force. Pat dry using gauze sponges, not tissue or cotton balls. Primary Dressing: Silvercel Small 2x2 (in/in) 1 x Per Day/30 Days Discharge Instructions: Apply Silvercel Small 2x2 (in/in) as instructed Secondary Dressing: Coverlet Latex-Free Fabric Adhesive Dressings 1 x Per Day/30 Days Discharge Instructions: Secure just the wounded toe Wound #7 - Calcaneus Wound Laterality: Right Cleanser: Normal Saline 1 x Per Day/30 Days Discharge Instructions: Wash your hands with soap and water. Remove old dressing, discard into plastic bag and place into trash. Cleanse the wound with Normal Saline prior to applying a clean dressing using gauze sponges, not tissues or cotton balls. Do not scrub or use excessive force. Pat dry using gauze sponges, not tissue or cotton balls. Primary Dressing: Silvercel Small 2x2 (in/in) 1 x Per Day/30 Days Discharge Instructions: Apply Silvercel Small 2x2 (in/in) as instructed Secondary Dressing: Coverlet Latex-Free Fabric Adhesive Dressings 1 x Per Day/30 Days MOROCCO, GIPE ([id number]) Discharge Instructions: 1.5 x 2 Wound #8 - Ankle Wound Laterality: Left, Medial Cleanser: Normal Saline 1 x Per Day/30 Days Discharge Instructions: Wash your hands with soap and water. Remove old dressing, discard into plastic bag and place  into trash. Cleanse the wound with Normal Saline prior to applying a clean dressing using gauze sponges, not tissues or cotton balls. Do not scrub or use excessive force. Pat dry using gauze sponges, not tissue or cotton balls. Peri-Wound Care: Desitin Maximum Strength Ointment 4 (oz) 1 x Per  Day/30 Days Primary Dressing: Silvercel Small 2x2 (in/in) 1 x Per Day/30 Days Discharge Instructions: Apply Silvercel Small 2x2 (in/in) as instructed Secondary Dressing: ABD Pad 5x9 (in/in) 1 x Per Day/30 Days Discharge Instructions: Cover with ABD pad Secondary Dressing: Kerlix 4.5 x 4.1 (in/yd) 1 x Per Day/30 Days Discharge Instructions: Apply Kerlix 4.5 x 4.1 (in/yd) as instructed Secured With: 61M Medipore H Soft Cloth Surgical Tape, 2x2 (in/yd) 1 x Per Day/30 Days Electronic Signature(s) Signed: 09/18/2020 5:02:46 PM By: Georges Mouse, Minus Breeding RN Signed: 09/20/2020 5:47:45 PM By: Worthy Keeler PA-C Entered By: Georges Mouse, Minus Breeding on 09/18/2020 09:45:16 Chad [name] ([id number]) -------------------------------------------------------------------------------- Problem List Details Patient Name: Chad [name] Date of Service: 09/18/2020 9:00 AM Medical Record Number: [id number] Patient Account Number: [id number] Date of Birth/Sex: 11-14-1960 (60 y.o. M) Treating RN: Dolan Amen Primary Care Provider: Clayborn Bigness Other Clinician: Referring Provider: Clayborn Bigness Treating Provider/Extender: Skipper Cliche in Treatment: 5 Active Problems ICD-10 Encounter Code Description Active Date MDM Diagnosis I87.2 Venous insufficiency (chronic) (peripheral) 08/14/2020 No Yes L97.322 Non-pressure chronic ulcer of left ankle with fat layer exposed 08/14/2020 No Yes L97.412 Non-pressure chronic ulcer of right heel and midfoot with fat layer 08/14/2020 No Yes exposed L97.512 Non-pressure chronic ulcer of other part of right foot with fat layer 08/14/2020 No Yes exposed I69.354 Hemiplegia and hemiparesis following cerebral infarction affecting left 08/14/2020 No Yes non-dominant side Inactive Problems Resolved Problems Electronic Signature(s) Signed: 09/18/2020 8:22:03 AM By: Worthy Keeler PA-C Entered By: Worthy Keeler on 09/18/2020 08:22:02 Chad [name]  ([id number]) -------------------------------------------------------------------------------- Progress Note Details Patient Name: Chad [name] Date of Service: 09/18/2020 9:00 AM Medical Record Number: [id number] Patient Account Number: [id number] Date of Birth/Sex: 1960/11/12 (59 y.o. M) Treating RN: Dolan Amen Primary Care Provider: Clayborn Bigness Other Clinician: Referring Provider: Clayborn Bigness Treating Provider/Extender: Skipper Cliche in Treatment: 5 Subjective Chief Complaint Information obtained from Patient Left ankle and right foot and heel ulcers History of Present Illness (HPI) 10/08/18 on evaluation today patient actually presents to our office for initial evaluation concerning wounds that he has of the bilateral lower extremities. 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 He also has bilateral venous stasis. He apparently has been homeless currently following discharge from the hospital apparently he has been placed at [facility] which is is a skilled nursing facility locally. Nonetheless fortunately he does not show any signs of infection at this time which is good news. In fact several of the wound actually appears to be showing some signs of improvement already in my pinion. There are a couple areas in the left leg in particular there likely gonna require some sharp debridement to help clear away some necrotic tissue and help with more sufficient healing. No fevers, chills, nausea, or vomiting noted at this time. 10/15/18 on evaluation today patient actually appears to be doing very well in regard to his bilateral lower extremities. He's been tolerating the dressing changes without complication. Fortunately there does not appear to be any evidence of active infection at this time which is great news.  Overall  I'm actually very pleased with how this has progressed in just one visits time. Readmission: 08/14/2020 upon evaluation today patient presents for re-evaluation here in our clinic. He is having issues with his left ankle region as well as his right toe and his right heel. He tells me that the toe and heel actually began as a area that was itching that he was scratching and then subsequently opened up into wounds. These may have been abscess areas I presume based on what I am seeing currently. With regard to his left ankle region he tells me this was a similar type occurrence although he does have venous stasis this very well may be more of a venous leg ulcer more than anything. Nonetheless I do believe that the patient would benefit from appropriate and aggressive wound care to try to help get things under better control here. He does have history of a stroke on the left side affecting him to some degree there that he is able to stand although he does have some residual weakness. Otherwise again the patient does have chronic venous insufficiency as previously noted. His arterial studies most recently obtained showed that he had an ABI on the right of 1.16 with a TBI of 0.52 and on the left and ABI of 1.14 with a TBI of 0.81. That was obtained on 06/19/2020. 08/28/2020 upon evaluation today patient appears to be doing decently well in regard to his wounds in general. He has been tolerating the dressing changes without complication. Fortunately there does not appear to be any signs of active infection which is great news. With that being said I think the [facility] is doing a good job I would recommend that we likely continue with that currently. 09/11/2020 upon evaluation today patient's wounds did not appear to be doing too poorly but again he is not really showing signs of significant improvement with regard to any of the wounds on the right. None of them have Hydrofera Blue on them I am not exactly  sure why this is not being followed as the facility did not contact us to let us know of any issues with obtaining dressings or otherwise. With that being said he is supposed to be using Hydrofera Blue on both of the wounds on the right foot as well as the ankle wound on the left side. 09/18/2020 upon evaluation today patient appears to be doing poorly with regard to his wounds. Again right now the left ankle in particular showed signs of extreme maceration. Apparently he was told by someone with staff at [facility] they could not get the [facility]. With that being said this is something that is never been relayed to [place] one way or another. Also the patient subsequently has not supposed to have a border gauze dressing on. He should have an ABD pad and roll gauze to secure as this drains much too much just to have a border gauze dressing to cover. Nonetheless the fact that they are not using the appropriate dressing is directly causing deterioration of the left ankle wound it is significantly worse today compared to what it was previous. I did attempt to call [facility] while the patient was here I called three times and got no one to even pick up the phone. After this I had my for an office coordinator call and she was able to finally get through and leave a message with the D ON as of dictation of this note which is  roughly about an hour and a half later I still have not been able to speak with anyone at the facility. Objective Constitutional Well-nourished and well-hydrated in no acute distress. Vitals Time Taken: 9:11 AM, Height: 67 in, Weight: 160 lbs, BMI: 25.1, Temperature: 98.2 F, Pulse: 96 bpm, Respiratory Rate: 20 breaths/min, Peregrina, Horace ([id number]) Blood Pressure: 116/74 mmHg. Respiratory normal breathing without difficulty. Psychiatric this patient is able to make decisions and demonstrates good insight into disease process. Alert and Oriented x 3.  pleasant and cooperative. General Notes: Upon inspection patient's wounds on the toe and heel area appear to be doing okay. I think were probably ready to switch over to collagen here. However the left ankle region is doing in my opinion significantly worse due to breakdown secondary to maceration in the adhesive dressing. Again we called him last time he was seen here a week ago and let the nursing staff know that this was not supposed to be an adhesive dressing that he should have an ABD pad and roll gauze to secure. With that being said the patient unfortunately still states that they have continue to put a large Band-Aid type dressing over this area such as a Marriott. He tells me that he mentioned something to them one time about this and they told him that that is what they begin to be doing. Again I have try to contact someone today including the DME and I have not heard back from them yet but as soon as I do I will discuss the situation as well. Integumentary (Hair, Skin) Wound #6 status is Open. Original cause of wound was Gradually Appeared. The date acquired was: 03/28/2020. The wound has been in treatment 5 weeks. The wound is located on the Right Toe Second. The wound measures 1.5cm length x 0.5cm width x 0.3cm depth; 0.589cm^2 area and 0.177cm^3 volume. There is Fat Layer (Subcutaneous Tissue) exposed. There is no tunneling or undermining noted. There is a medium amount of serosanguineous drainage noted. There is medium (34-66%) granulation within the wound bed. There is a medium (34-66%) amount of necrotic tissue within the wound bed including Adherent Slough. Wound #7 status is Open. Original cause of wound was Gradually Appeared. The date acquired was: 08/29/2019. The wound has been in treatment 5 weeks. The wound is located on the Right Calcaneus. The wound measures 0.5cm length x 0.3cm width x 0.1cm depth; 0.118cm^2 area and 0.012cm^3 volume. There is Fat Layer (Subcutaneous  Tissue) exposed. There is no tunneling or undermining noted. There is a medium amount of serosanguineous drainage noted. There is large (67-100%) pink, pale granulation within the wound bed. There is a small (1-33%) amount of necrotic tissue within the wound bed including Adherent Slough. Wound #8 status is Open. Original cause of wound was Gradually Appeared. The date acquired was: 07/12/2019. The wound has been in treatment 5 weeks. The wound is located on the Left,Medial Ankle. The wound measures 7.5cm length x 6.5cm width x 0.1cm depth; 38.288cm^2 area and 3.829cm^3 volume. There is Fat Layer (Subcutaneous Tissue) exposed. There is no tunneling or undermining noted. There is a medium amount of serosanguineous drainage noted. There is small (1-33%) red, pink granulation within the wound bed. There is a large (67-100%) amount of necrotic tissue within the wound bed including Adherent Slough. 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 Wound #7 Right Calcaneus: Return Appointment in 1 week. Wound #8 Left,Medial Ankle: Return Appointment in 1 week. Bathing/ Shower/ Hygiene: Wound #6 Right Toe Second: Clean wound with Normal Saline or wound cleanser. May shower; gently cleanse wound with antibacterial soap, rinse and pat dry prior to dressing wounds Wound #7 Right Calcaneus: Clean wound with Normal Saline or wound cleanser. May shower; gently cleanse wound with antibacterial soap, rinse and pat dry prior to dressing wounds Wound #8 Left,Medial Ankle: Clean wound  with Normal Saline or wound cleanser. May shower; gently cleanse wound with antibacterial soap, rinse and pat dry prior to dressing wounds WOUND #6: - Toe Second Wound Laterality: Right Cleanser: Normal Saline 1 x Per Day/30 Days PADRAIG, NHAN ([id number]) Discharge Instructions: Wash your hands with soap and water. Remove old dressing, discard into plastic bag and place into trash. Cleanse the wound with Normal Saline prior to applying a clean dressing using gauze sponges, not tissues or cotton balls. Do not scrub or use excessive force. Pat dry using gauze sponges, not tissue or cotton balls. Primary Dressing: Silvercel Small 2x2 (in/in) 1 x Per Day/30 Days Discharge Instructions: Apply Silvercel Small 2x2 (in/in) as instructed Secondary Dressing: Coverlet Latex-Free Fabric Adhesive Dressings 1 x Per Day/30 Days Discharge Instructions: Secure just the wounded toe WOUND #7: - Calcaneus Wound Laterality: Right Cleanser: Normal Saline 1 x Per Day/30 Days Discharge Instructions: Wash your hands with soap and water. Remove old dressing, discard into plastic bag and place into trash. Cleanse the wound with Normal Saline prior to applying a clean dressing using gauze sponges, not tissues or cotton balls. Do not scrub or use excessive force. Pat dry using gauze sponges, not tissue or cotton balls. Primary Dressing: Silvercel Small 2x2 (in/in) 1 x Per Day/30 Days Discharge Instructions: Apply Silvercel Small 2x2 (in/in) as instructed Secondary Dressing: Coverlet Latex-Free Fabric Adhesive Dressings 1 x Per Day/30 Days Discharge Instructions: 1.5 x 2 WOUND #8: - Ankle Wound Laterality: Left, Medial Cleanser: Normal Saline 1 x Per Day/30 Days Discharge Instructions: Wash your hands with soap and water. Remove old dressing, discard into plastic bag and place into trash. Cleanse the wound with Normal Saline prior to applying a clean dressing using gauze sponges, not tissues or cotton balls. Do not  scrub or use excessive force. Pat dry using gauze sponges, not tissue or cotton balls. Peri-Wound Care: Desitin Maximum Strength Ointment 4 (oz) 1 x Per Day/30 Days Primary Dressing: Silvercel Small 2x2 (in/in) 1 x Per Day/30 Days Discharge Instructions: Apply Silvercel Small 2x2 (in/in) as instructed Secondary Dressing: ABD Pad 5x9 (in/in) 1 x Per Day/30 Days Discharge Instructions: Cover with ABD pad Secondary Dressing: Kerlix 4.5 x 4.1 (in/yd) 1 x Per Day/30 Days Discharge Instructions: Apply Kerlix 4.5 x 4.1 (in/yd) as instructed Secured With: 70M Medipore H Soft Cloth Surgical Tape, 2x2 (in/yd) 1 x Per Day/30 Days 1. At this point I would recommend that the patient be switched to a silver alginate dressing for the left ankle area. Again the unfortunate thing here is that I do believe the [facility] would be better but nonetheless I think the silver alginate will do okay. Either way I do wish the facility had contacted me to let me know they did not have the ability to get the [facility] again  I have heard nothing from them in this regard the patient told me this today. 2. I am to recommend that she still he needs to have an ABD pad and roll gauze to secure her on the ankle area. This should not be a border gauze/T[place]a island type dressing. He is draining much too much to allow this to be the primary covering for the alginate. Using a border gauze dressing is directly causing breakdown of deterioration of the wound and this again is pointless that should not be something that is happening. 3. I am good recommend a switch to silver collagen followed by a coverlet/Band-Aid dressing over the heel as well as the toe I think this will be appropriate. We will see patient back for reevaluation in 1 week here in the clinic. If anything worsens or changes patient will contact our office for additional recommendations. Electronic Signature(s) Signed: 09/18/2020 10:13:13 AM By: Worthy Keeler  PA-C Entered By: Worthy Keeler on 09/18/2020 10:13:13 Chad [name] ([id number]) -------------------------------------------------------------------------------- SuperBill Details Patient Name: Chad [name] Date of Service: 09/18/2020 Medical Record Number: [id number] Patient Account Number: [id number] Date of Birth/Sex: 07-24-1961 (59 y.o. 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 part of right foot with fat layer exposed 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 side Facility Procedures CPT4 Code: [id number] Description: 99213 - WOUND CARE VISIT-LEV 3 EST PT Modifier: Quantity: 1 Physician Procedures CPT4 Code: 7340370 Description: 96438 - WC PHYS LEVEL 4 - EST PT Modifier: Quantity: 1 CPT4 Code: Description: ICD-10 Diagnosis Description I87.2 Venous insufficiency (chronic) (peripheral) 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

## 2020-09-18 NOTE — Progress Notes (Addendum)
LOIS, OSTROM ([id number]) Visit Report for 09/18/2020 Arrival Information Details Patient Name: Chad [name], Chad [name] Date of Service: 09/18/2020 9:00 AM Medical Record Number: [id number] Patient Account Number: [id number] Date of Birth/Sex: 1961-02-13 (60 y.o. M) Treating RN: Carlene Coria Primary Care Provider: Clayborn Bigness Other Clinician: Referring Provider: Clayborn Bigness Treating Provider/Extender: Skipper Cliche in Treatment: 5 Visit Information History Since Last Visit All ordered tests and consults were completed: No Patient Arrived: Wheel Chair Added or deleted any medications: No Arrival Time: 09:06 Any new allergies or adverse reactions: No Accompanied By: self Had a fall or experienced change in No Transfer Assistance: None activities of daily living that may affect Patient Identification Verified: Yes risk of falls: Secondary Verification Process Completed: Yes Signs or symptoms of abuse/neglect since last visito No Patient Requires Transmission-Based No Hospitalized since last visit: No Precautions: Implantable device outside of the clinic excluding No Patient Has Alerts: Yes cellular tissue based products placed in the center Patient Alerts: Patient on Blood since last visit: Thinner Has Dressing in Place as Prescribed: Yes ABI right 1.16 Has Compression in Place as Prescribed: Yes ABI left 1.14 Pain Present Now: No Electronic Signature(s) Signed: 09/20/2020 11:27:08 AM By: Carlene Coria RN Entered By: Carlene Coria on 09/18/2020 09:11:36 Chad [name] ([id number]) -------------------------------------------------------------------------------- Clinic Level of Care Assessment Details Patient Name: Chad [name] Date of Service: 09/18/2020 9:00 AM Medical Record Number: [id number] Patient Account Number: [id number] Date of Birth/Sex: 03-May-1961 (60 y.o. M) Treating RN: Dolan Amen Primary Care Provider: Clayborn Bigness Other Clinician: Referring Provider: Clayborn Bigness Treating Provider/Extender: Skipper Cliche in Treatment: 5 Clinic Level of Care Assessment Items TOOL 4 Quantity Score X - Use when only an EandM is performed on FOLLOW-UP visit 1 0 ASSESSMENTS - Nursing Assessment / Reassessment X - Reassessment of Co-morbidities (includes updates in patient status) 1 10 X- 1 5 Reassessment of Adherence to Treatment Plan ASSESSMENTS - Wound and Skin Assessment / Reassessment []  - Simple Wound Assessment / Reassessment - one wound 0 X- 3 5 Complex Wound Assessment / Reassessment - multiple wounds []  - 0 Dermatologic / Skin Assessment (not related to wound area) ASSESSMENTS - Focused Assessment []  - Circumferential Edema Measurements - multi extremities 0 []  - 0 Nutritional Assessment / Counseling / Intervention []  - 0 Lower Extremity Assessment (monofilament, tuning fork, pulses) []  - 0 Peripheral Arterial Disease Assessment (using hand held doppler) ASSESSMENTS - Ostomy and/or Continence Assessment and Care []  - Incontinence Assessment and Management 0 []  - 0 Ostomy Care Assessment and Management (repouching, etc.) PROCESS - Coordination of Care X - Simple Patient / Family Education for ongoing care 1 15 []  - 0 Complex (extensive) Patient / Family Education for ongoing care []  - 0 Staff obtains Programmer, systems, Records, Test Results / Process Orders []  - 0 Staff telephones HHA, Nursing Homes / Clarify orders / etc []  - 0 Routine Transfer to another Facility (non-emergent condition) []  - 0 Routine Hospital Admission (non-emergent condition) []  - 0 New Admissions / Biomedical engineer / Ordering NPWT, Apligraf, etc. []  - 0 Emergency Hospital Admission (emergent condition) X- 1 10 Simple Discharge Coordination []  - 0 Complex (extensive) Discharge Coordination PROCESS - Special Needs []  - Pediatric / Minor Patient Management 0 []  - 0 Isolation Patient Management []  - 0 Hearing / Language / Visual special needs []  -  0 Assessment of Community assistance (transportation, D/C planning, etc.) []  - 0 Additional assistance / Altered mentation []  - 0 Support Surface(s) Assessment (bed, cushion, seat, etc.) INTERVENTIONS -  Wound Cleansing / Measurement Chad [name], Chad [name] ([id number]) []  - 0 Simple Wound Cleansing - one wound X- 3 5 Complex Wound Cleansing - multiple wounds X- 1 5 Wound Imaging (photographs - any number of wounds) []  - 0 Wound Tracing (instead of photographs) []  - 0 Simple Wound Measurement - one wound X- 3 5 Complex Wound Measurement - multiple wounds INTERVENTIONS - Wound Dressings []  - Small Wound Dressing one or multiple wounds 0 X- 1 15 Medium Wound Dressing one or multiple wounds []  - 0 Large Wound Dressing one or multiple wounds []  - 0 Application of Medications - topical []  - 0 Application of Medications - injection INTERVENTIONS - Miscellaneous []  - External ear exam 0 []  - 0 Specimen Collection (cultures, biopsies, blood, body fluids, etc.) []  - 0 Specimen(s) / Culture(s) sent or taken to Lab for analysis []  - 0 Patient Transfer (multiple staff / Civil Service fast streamer / Similar devices) []  - 0 Simple Staple / Suture removal (25 or less) []  - 0 Complex Staple / Suture removal (26 or more) []  - 0 Hypo / Hyperglycemic Management (close monitor of Blood Glucose) []  - 0 Ankle / Brachial Index (ABI) - do not check if billed separately X- 1 5 Vital Signs Has the patient been seen at the hospital within the last three years: Yes Total Score: 110 Level Of Care: New/Established - Level 3 Electronic Signature(s) Signed: 09/18/2020 5:02:46 PM By: Georges Mouse, Minus Breeding RN Entered By: Georges Mouse, Minus Breeding on 09/18/2020 09:46:14 Chad [name] ([id number]) -------------------------------------------------------------------------------- Encounter Discharge Information Details Patient Name: Chad [name] Date of Service: 09/18/2020 9:00 AM Medical Record Number:  [id number] Patient Account Number: [id number] Date of Birth/Sex: [birth year] (60 y.o. M) Treating RN: Dolan Amen Primary Care Provider: Clayborn Bigness Other Clinician: Referring Provider: Clayborn Bigness Treating Provider/Extender: Skipper Cliche in Treatment: 5 Encounter Discharge Information Items Discharge Condition: Stable Ambulatory Status: Wheelchair Discharge Destination: Skilled Nursing Facility Orders Sent: Yes Transportation: Private Auto Accompanied By: self Schedule Follow-up Appointment: Yes Clinical Summary of Care: Electronic Signature(s) Signed: 09/18/2020 5:02:46 PM By: Georges Mouse, Minus Breeding RN Entered By: Georges Mouse, Minus Breeding on 09/18/2020 09:47:14 Chad [name] ([id number]) -------------------------------------------------------------------------------- Lower Extremity Assessment Details Patient Name: Chad [name] Date of Service: 09/18/2020 9:00 AM Medical Record Number: [id number] Patient Account Number: [id number] Date of Birth/Sex: [birth year] (60 y.o. M) Treating RN: Carlene Coria Primary Care Provider: Clayborn Bigness Other Clinician: Referring Provider: Clayborn Bigness Treating Provider/Extender: Jeri Cos Weeks in Treatment: 5 Edema Assessment Assessed: [Left: No] [Right: No] [Left: Edema] [Right: :] Calf Left: Right: Point of Measurement: 33 cm From Medial Instep 32.4 cm 33 cm Ankle Left: Right: Point of Measurement: 10 cm From Medial Instep 22.6 cm 20.5 cm Vascular Assessment Pulses: Dorsalis Pedis Palpable: [Left:Yes] [Right:Yes] Electronic Signature(s) Signed: 09/20/2020 11:27:08 AM By: Carlene Coria RN Entered By: Carlene Coria on 09/18/2020 09:22:15 Chad [name] ([id number]) -------------------------------------------------------------------------------- Multi Wound Chart Details Patient Name: Chad [name] Date of Service: 09/18/2020 9:00 AM Medical Record Number: [id number] Patient Account Number: [id number] Date of Birth/Sex: [birth year]  (60 y.o. M) Treating RN: Dolan Amen Primary Care Provider: Clayborn Bigness Other Clinician: Referring Provider: Clayborn Bigness Treating Provider/Extender: Skipper Cliche in Treatment: 5 Vital Signs Height(in): 36 Pulse(bpm): 32 Weight(lbs): 160 Blood Pressure(mmHg): 116/74 Body Mass Index(BMI): 25 Temperature(F): 98.2 Respiratory Rate(breaths/min): 20 Photos: Wound Location: Right Toe Second Right Calcaneus Left, Medial Ankle Wounding Event: Gradually Appeared Gradually Appeared Gradually Appeared Primary Etiology: Abscess Abscess Venous Leg Ulcer Comorbid History: Anemia, Chronic Obstructive Anemia, Chronic Obstructive Anemia, Chronic Obstructive Pulmonary  Disease (COPD), Pulmonary Disease (COPD), Pulmonary Disease (COPD), Hepatitis C, Received Hepatitis C, Received Hepatitis C, Received Chemotherapy Chemotherapy Chemotherapy Date Acquired: 03/28/2020 08/29/2019 07/12/2019 Weeks of Treatment: 5 5 5  Wound Status: Open Open Open Measurements L x W x D (cm) 1.5x0.5x0.3 0.5x0.3x0.1 7.5x6.5x0.1 Area (cm) : 0.589 0.118 38.288 Volume (cm) : 0.177 0.012 3.829 % Reduction in Area: 51.00% 72.70% -550.10% % Reduction in Volume: -47.50% 72.10% -550.10% Classification: Full Thickness Without Exposed Full Thickness Without Exposed Full Thickness Without Exposed Support Structures Support Structures Support Structures Exudate Amount: Medium Medium Medium Exudate Type: Serosanguineous Serosanguineous Serosanguineous Exudate Color: red, brown red, brown red, brown Granulation Amount: Medium (34-66%) Large (67-100%) Small (1-33%) Granulation Quality: N/A Pink, Pale Red, Pink Necrotic Amount: Medium (34-66%) Small (1-33%) Large (67-100%) Exposed Structures: Fat Layer (Subcutaneous Tissue): Fat Layer (Subcutaneous Tissue): Fat Layer (Subcutaneous Tissue): Yes Yes Yes Fascia: No Fascia: No Fascia: No Tendon: No Tendon: No Tendon: No Muscle: No Muscle: No Muscle: No Joint: No Joint:  No Joint: No Bone: No Bone: No Bone: No Epithelialization: None None Small (1-33%) Treatment Notes Electronic Signature(s) Signed: 09/18/2020 5:02:46 PM By: Georges Mouse, Minus Breeding RN Entered By: Georges Mouse, Minus Breeding on 09/18/2020 09:36:47 Chad [name] ([id number]) -------------------------------------------------------------------------------- Multi-Disciplinary Care Plan Details Patient Name: Chad [name] Date of Service: 09/18/2020 9:00 AM Medical Record Number: [id number] Patient Account Number: [id number] Date of Birth/Sex: [birth year] (60 y.o. M) Treating RN: Dolan Amen Primary Care Provider: Clayborn Bigness Other Clinician: Referring Provider: Clayborn Bigness Treating Provider/Extender: Skipper Cliche in Treatment: 5 Active Inactive Wound/Skin Impairment Nursing Diagnoses: Knowledge deficit related to ulceration/compromised skin integrity Goals: Patient/caregiver will verbalize understanding of skin care regimen Date Initiated: 08/14/2020 Target Resolution Date: 09/14/2020 Goal Status: Active Ulcer/skin breakdown will have a volume reduction of 30% by week 4 Date Initiated: 08/14/2020 Target Resolution Date: 09/14/2020 Goal Status: Active Interventions: Assess patient/caregiver ability to obtain necessary supplies Assess patient/caregiver ability to perform ulcer/skin care regimen upon admission and as needed Assess ulceration(s) every visit Notes: Electronic Signature(s) Signed: 09/18/2020 5:02:46 PM By: Georges Mouse, Minus Breeding RN Entered By: Georges Mouse, Minus Breeding on 09/18/2020 09:36:22 Chad [name] ([id number]) -------------------------------------------------------------------------------- Pain Assessment Details Patient Name: Chad [name] Date of Service: 09/18/2020 9:00 AM Medical Record Number: [id number] Patient Account Number: [id number] Date of Birth/Sex: July 24, 1961 (60 y.o. M) Treating RN: Carlene Coria Primary Care Provider: Clayborn Bigness Other  Clinician: Referring Provider: Clayborn Bigness Treating Provider/Extender: Skipper Cliche in Treatment: 5 Active Problems Location of Pain Severity and Description of Pain Patient Has Paino No Site Locations Pain Management and Medication Current Pain Management: Electronic Signature(s) Signed: 09/20/2020 11:27:08 AM By: Carlene Coria RN Entered By: Carlene Coria on 09/18/2020 09:11:58 Chad [name] ([id number]) -------------------------------------------------------------------------------- Patient/Caregiver Education Details Patient Name: Chad [name] Date of Service: 09/18/2020 9:00 AM Medical Record Number: [id number] Patient Account Number: [id number] Date of Birth/Gender: 12/24/1960 (60 y.o. M) Treating RN: Dolan Amen Primary Care Physician: Clayborn Bigness Other Clinician: Referring Physician: Clayborn Bigness Treating Physician/Extender: Skipper Cliche in Treatment: 5 Education Assessment Education Provided To: Patient Education Topics Provided Wound/Skin Impairment: Methods: Explain/Verbal Responses: State content correctly Electronic Signature(s) Signed: 09/18/2020 5:02:46 PM By: Georges Mouse, Minus Breeding RN Entered By: Georges Mouse, Minus Breeding on 09/18/2020 09:46:33 Chad [name] ([id number]) -------------------------------------------------------------------------------- Wound Assessment Details Patient Name: Chad [name] Date of Service: 09/18/2020 9:00 AM Medical Record Number: [id number] Patient Account Number: [id number] Date of Birth/Sex: July 07, 1961 (60 y.o. M) Treating RN: Carlene Coria Primary Care Provider: Clayborn Bigness Other Clinician: Referring Provider: Clayborn Bigness Treating Provider/Extender: Skipper Cliche  in Treatment: 5 Wound Status Wound Number: 6 Primary Abscess Etiology: Wound Location: Right Toe Second Wound Open Wounding Event: Gradually Appeared Status: Date Acquired: 03/28/2020 Comorbid Anemia, Chronic Obstructive Pulmonary Disease  (COPD), Weeks Of Treatment: 5 History: Hepatitis C, Received Chemotherapy Clustered Wound: No Photos Wound Measurements Length: (cm) 1.5 Width: (cm) 0.5 Depth: (cm) 0.3 Area: (cm) 0.589 Volume: (cm) 0.177 % Reduction in Area: 51% % Reduction in Volume: -47.5% Epithelialization: None Tunneling: No Undermining: No Wound Description Classification: Full Thickness Without Exposed Support Structures Exudate Amount: Medium Exudate Type: Serosanguineous Exudate Color: red, brown Foul Odor After Cleansing: No Slough/Fibrino Yes Wound Bed Granulation Amount: Medium (34-66%) Exposed Structure Necrotic Amount: Medium (34-66%) Fascia Exposed: No Necrotic Quality: Adherent Slough Fat Layer (Subcutaneous Tissue) Exposed: Yes Tendon Exposed: No Muscle Exposed: No Joint Exposed: No Bone Exposed: No Treatment Notes Wound #6 (Toe Second) Wound Laterality: Right Cleanser Normal Saline Discharge Instruction: Wash your hands with soap and water. Remove old dressing, discard into plastic bag and place into trash. Cleanse the wound with Normal Saline prior to applying a clean dressing using gauze sponges, not tissues or cotton balls. Do not scrub or use excessive force. Pat dry using gauze sponges, not tissue or cotton balls. Chad [name], Chad [name] ([id number]) Peri-Wound Care Topical Primary Dressing Silvercel Small 2x2 (in/in) Discharge Instruction: Apply Silvercel Small 2x2 (in/in) as instructed Secondary Dressing Coverlet Latex-Free Fabric Adhesive Dressings Discharge Instruction: Secure just the wounded toe Secured With Compression Wrap Compression Stockings Add-Ons Electronic Signature(s) Signed: 09/20/2020 11:27:08 AM By: Carlene Coria RN Entered By: Carlene Coria on 09/18/2020 09:20:42 Chad [name] ([id number]) -------------------------------------------------------------------------------- Wound Assessment Details Patient Name: Chad [name] Date of Service: 09/18/2020 9:00  AM Medical Record Number: [id number] Patient Account Number: [id number] Date of Birth/Sex: [birth year] (60 y.o. M) Treating RN: Carlene Coria Primary Care Provider: Clayborn Bigness Other Clinician: Referring Provider: Clayborn Bigness Treating Provider/Extender: Jeri Cos Weeks in Treatment: 5 Wound Status Wound Number: 7 Primary Abscess Etiology: Wound Location: Right Calcaneus Wound Open Wounding Event: Gradually Appeared Status: Date Acquired: 08/29/2019 Comorbid Anemia, Chronic Obstructive Pulmonary Disease (COPD), Weeks Of Treatment: 5 History: Hepatitis C, Received Chemotherapy Clustered Wound: No Photos Wound Measurements Length: (cm) 0.5 Width: (cm) 0.3 Depth: (cm) 0.1 Area: (cm) 0.118 Volume: (cm) 0.012 % Reduction in Area: 72.7% % Reduction in Volume: 72.1% Epithelialization: None Tunneling: No Undermining: No Wound Description Classification: Full Thickness Without Exposed Support Structures Exudate Amount: Medium Exudate Type: Serosanguineous Exudate Color: red, brown Foul Odor After Cleansing: No Slough/Fibrino Yes Wound Bed Granulation Amount: Large (67-100%) Exposed Structure Granulation Quality: Pink, Pale Fascia Exposed: No Necrotic Amount: Small (1-33%) Fat Layer (Subcutaneous Tissue) Exposed: Yes Necrotic Quality: Adherent Slough Tendon Exposed: No Muscle Exposed: No Joint Exposed: No Bone Exposed: No Treatment Notes Wound #7 (Calcaneus) Wound Laterality: Right Cleanser Normal Saline Discharge Instruction: Wash your hands with soap and water. Remove old dressing, discard into plastic bag and place into trash. Cleanse the wound with Normal Saline prior to applying a clean dressing using gauze sponges, not tissues or cotton balls. Do not scrub or use excessive force. Pat dry using gauze sponges, not tissue or cotton balls. Chad [name], Chad [name] ([id number]) Peri-Wound Care Topical Primary Dressing Silvercel Small 2x2 (in/in) Discharge Instruction: Apply  Silvercel Small 2x2 (in/in) as instructed Secondary Dressing Coverlet Latex-Free Fabric Adhesive Dressings Discharge Instruction: 1.5 x 2 Secured With Compression Wrap Compression Stockings Add-Ons Electronic Signature(s) Signed: 09/20/2020 11:27:08 AM By: Carlene Coria RN Entered By: Carlene Coria on 09/18/2020 09:21:15 Chad [name] ([id number]) -------------------------------------------------------------------------------- Wound  Assessment Details Patient Name: Chad [name], Chad [name] Date of Service: 09/18/2020 9:00 AM Medical Record Number: [id number] Patient Account Number: [id number] Date of Birth/Sex: 03/29/1961 (60 y.o. M) Treating RN: Carlene Coria Primary Care Provider: Clayborn Bigness Other Clinician: Referring Provider: Clayborn Bigness Treating Provider/Extender: Jeri Cos Weeks in Treatment: 5 Wound Status Wound Number: 8 Primary Venous Leg Ulcer Etiology: Wound Location: Left, Medial Ankle Wound Open Wounding Event: Gradually Appeared Status: Date Acquired: 07/12/2019 Comorbid Anemia, Chronic Obstructive Pulmonary Disease (COPD), Weeks Of Treatment: 5 History: Hepatitis C, Received Chemotherapy Clustered Wound: No Photos Wound Measurements Length: (cm) 7.5 Width: (cm) 6.5 Depth: (cm) 0.1 Area: (cm) 38.288 Volume: (cm) 3.829 % Reduction in Area: -550.1% % Reduction in Volume: -550.1% Epithelialization: Small (1-33%) Tunneling: No Undermining: No Wound Description Classification: Full Thickness Without Exposed Support Structures Exudate Amount: Medium Exudate Type: Serosanguineous Exudate Color: red, brown Foul Odor After Cleansing: No Slough/Fibrino Yes Wound Bed Granulation Amount: Small (1-33%) Exposed Structure Granulation Quality: Red, Pink Fascia Exposed: No Necrotic Amount: Large (67-100%) Fat Layer (Subcutaneous Tissue) Exposed: Yes Necrotic Quality: Adherent Slough Tendon Exposed: No Muscle Exposed: No Joint Exposed: No Bone Exposed: No Treatment  Notes Wound #8 (Ankle) Wound Laterality: Left, Medial Cleanser Normal Saline Discharge Instruction: Wash your hands with soap and water. Remove old dressing, discard into plastic bag and place into trash. Cleanse the wound with Normal Saline prior to applying a clean dressing using gauze sponges, not tissues or cotton balls. Do not scrub or use excessive force. Pat dry using gauze sponges, not tissue or cotton balls. Chad [name], Chad [name] ([id number]) Peri-Wound Care Desitin Maximum Strength Ointment 4 (oz) Topical Primary Dressing Silvercel Small 2x2 (in/in) Discharge Instruction: Apply Silvercel Small 2x2 (in/in) as instructed Secondary Dressing ABD Pad 5x9 (in/in) Discharge Instruction: Cover with ABD pad Kerlix 4.5 x 4.1 (in/yd) Discharge Instruction: Apply Kerlix 4.5 x 4.1 (in/yd) as instructed Secured With 65M [place] Surgical Tape, 2x2 (in/yd) Compression Wrap Compression Stockings Add-Ons Electronic Signature(s) Signed: 09/20/2020 11:27:08 AM By: Carlene Coria RN Entered By: Carlene Coria on 09/18/2020 09:21:41 Chad [name] ([id number]) -------------------------------------------------------------------------------- Sharon Details Patient Name: Chad [name] Date of Service: 09/18/2020 9:00 AM Medical Record Number: [id number] Patient Account Number: [id number] Date of Birth/Sex: [birth year] (60 y.o. M) Treating RN: Carlene Coria Primary Care Provider: Clayborn Bigness Other Clinician: Referring Provider: Clayborn Bigness Treating Provider/Extender: Skipper Cliche in Treatment: 5 Vital Signs Time Taken: 09:11 Temperature (F): 98.2 Height (in): 67 Pulse (bpm): 96 Weight (lbs): 160 Respiratory Rate (breaths/min): 20 Body Mass Index (BMI): 25.1 Blood Pressure (mmHg): 116/74 Reference Range: 80 - 120 mg / dl Electronic Signature(s) Signed: 09/20/2020 11:27:08 AM By: Carlene Coria RN Entered By: Carlene Coria on 09/18/2020 09:11:52

## 2020-09-20 ENCOUNTER — Encounter: Admission: RE | Payer: Self-pay | Source: Home / Self Care

## 2020-09-20 ENCOUNTER — Ambulatory Visit: Admission: RE | Admit: 2020-09-20 | Payer: Medicaid Other | Source: Home / Self Care | Admitting: Gastroenterology

## 2020-09-20 SURGERY — COLONOSCOPY WITH PROPOFOL
Anesthesia: General

## 2020-09-25 ENCOUNTER — Other Ambulatory Visit: Payer: Self-pay

## 2020-09-25 ENCOUNTER — Encounter: Payer: Medicaid Other | Attending: Physician Assistant | Admitting: Physician Assistant

## 2020-09-25 DIAGNOSIS — L97322 Non-pressure chronic ulcer of left ankle with fat layer exposed: Secondary | ICD-10-CM | POA: Insufficient documentation

## 2020-09-25 DIAGNOSIS — I872 Venous insufficiency (chronic) (peripheral): Secondary | ICD-10-CM | POA: Insufficient documentation

## 2020-09-25 DIAGNOSIS — L97412 Non-pressure chronic ulcer of right heel and midfoot with fat layer exposed: Secondary | ICD-10-CM | POA: Insufficient documentation

## 2020-09-25 DIAGNOSIS — L97419 Non-pressure chronic ulcer of right heel and midfoot with unspecified severity: Secondary | ICD-10-CM | POA: Diagnosis present

## 2020-09-25 DIAGNOSIS — L97512 Non-pressure chronic ulcer of other part of right foot with fat layer exposed: Secondary | ICD-10-CM | POA: Diagnosis not present

## 2020-09-25 DIAGNOSIS — I69354 Hemiplegia and hemiparesis following cerebral infarction affecting left non-dominant side: Secondary | ICD-10-CM | POA: Insufficient documentation

## 2020-09-25 NOTE — Progress Notes (Addendum)
ANTIONE, OBAR ([id number]) Visit Report for 09/25/2020 Chief Complaint Document Details Patient Name: Chad [name], Chad [name] Date of Service: 09/25/2020 9:00 AM Medical Record Number: [id number] Patient Account Number: [id number] Date of Birth/Sex: November 02, 1960 (60 y.o. M) Treating RN: Dolan Amen Primary Care Provider: Clayborn Bigness Other Clinician: Referring Provider: Clayborn Bigness Treating Provider/Extender: Skipper Cliche in Treatment: 6 Information Obtained from: Patient Chief Complaint Left ankle and right foot and heel ulcers Electronic Signature(s) Signed: 09/25/2020 9:54:15 AM By: Worthy Keeler PA-C Entered By: Worthy Keeler on 09/25/2020 09:54:15 Chad [name] ([id number]) -------------------------------------------------------------------------------- HPI Details Patient Name: Chad [name] Date of Service: 09/25/2020 9:00 AM Medical Record Number: [id number] Patient Account Number: [id number] Date of Birth/Sex: Apr 15, 1961 (59 y.o. M) Treating RN: Dolan Amen Primary Care Provider: Clayborn Bigness Other Clinician: Referring Provider: Clayborn Bigness Treating Provider/Extender: Skipper Cliche in Treatment: 6 History of Present Illness HPI Description: 10/08/18 on evaluation today patient actually presents to our office for initial evaluation concerning wounds that he has of the bilateral lower extremities. 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 He also has bilateral venous stasis. He apparently has been homeless currently following discharge from the hospital apparently he has been placed at [facility] which is is a skilled nursing facility locally. Nonetheless fortunately he does not show any signs of infection at this time which is good news. In fact several of the wound actually appears to be showing some signs of  improvement already in my pinion. There are a couple areas in the left leg in particular there likely gonna require some sharp debridement to help clear away some necrotic tissue and help with more sufficient healing. No fevers, chills, nausea, or vomiting noted at this time. 10/15/18 on evaluation today patient actually appears to be doing very well in regard to his bilateral lower extremities. He's been tolerating the dressing changes without complication. Fortunately there does not appear to be any evidence of active infection at this time which is great news. Overall I'm actually very pleased with how this has progressed in just one visits time. Readmission: 08/14/2020 upon evaluation today patient presents for re-evaluation here in our clinic. He is having issues with his left ankle region as well as his right toe and his right heel. He tells me that the toe and heel actually began as a area that was itching that he was scratching and then subsequently opened up into wounds. These may have been abscess areas I presume based on what I am seeing currently. With regard to his left ankle region he tells me this was a similar type occurrence although he does have venous stasis this very well may be more of a venous leg ulcer more than anything. Nonetheless I do believe that the patient would benefit from appropriate and aggressive wound care to try to help get things under better control here. He does have history of a stroke on the left side affecting him to some degree there that he is able to stand although he does have some residual weakness. Otherwise again the patient does have chronic venous insufficiency as previously noted. His arterial studies most recently obtained showed that he had an ABI on the right of 1.16 with a TBI of 0.52 and on the left and ABI of 1.14 with a TBI of 0.81. That was obtained on 06/19/2020. 08/28/2020 upon evaluation today  patient appears to be doing decently well in  regard to his wounds in general. He has been tolerating the dressing changes without complication. Fortunately there does not appear to be any signs of active infection which is great news. With that being said I think the [facility] is doing a good job I would recommend that we likely continue with that currently. 09/11/2020 upon evaluation today patient's wounds did not appear to be doing too poorly but again he is not really showing signs of significant improvement with regard to any of the wounds on the right. None of them have Hydrofera Blue on them I am not exactly sure why this is not being followed as the facility did not contact us to let us know of any issues with obtaining dressings or otherwise. With that being said he is supposed to be using Hydrofera Blue on both of the wounds on the right foot as well as the ankle wound on the left side. 09/18/2020 upon evaluation today patient appears to be doing poorly with regard to his wounds. Again right now the left ankle in particular showed signs of extreme maceration. Apparently he was told by someone with staff at [facility] they could not get the [facility]. With that being said this is something that is never been relayed to [place] one way or another. Also the patient subsequently has not supposed to have a border gauze dressing on. He should have an ABD pad and roll gauze to secure as this drains much too much just to have a border gauze dressing to cover. Nonetheless the fact that they are not using the appropriate dressing is directly causing deterioration of the left ankle wound it is significantly worse today compared to what it was previous. I did attempt to call [facility] while the patient was here I called three times and got no one to even pick up the phone. After this I had my for an office coordinator call and she was able to finally get through and leave a message with the D ON as of dictation of this note  which is roughly about an hour and a half later I still have not been able to speak with anyone at the facility. 09/25/2020 upon evaluation today patient actually showing signs of good improvement which is excellent news. He has been tolerating the dressing changes without complication. Fortunately there is no signs of active infection which is great news. No fevers, chills, nausea, vomiting, or diarrhea. I do feel like the facility has been doing a much better job at taking care of him as far as the dressings are concerned. However the director of nursing never did call me back. Electronic Signature(s) Signed: 09/25/2020 10:02:34 AM By: Worthy Keeler PA-C Entered By: Worthy Keeler on 09/25/2020 10:02:34 Chad [name] ([id number]) -------------------------------------------------------------------------------- Otelia Sergeant TISS Details Patient Name: Chad [name] Date of Service: 09/25/2020 9:00 AM Medical Record Number: [id number] Patient Account Number: [id number] Date of Birth/Sex: 04-20-1961 (59 y.o. M) Treating RN: Cornell Barman Primary Care Provider: Clayborn Bigness Other Clinician: Referring Provider: Clayborn Bigness Treating Provider/Extender: Skipper Cliche in Treatment: 6 Procedure Performed for: Wound #8 Left,Medial Ankle Performed By: Physician Tommie Sams., PA-C Post Procedure Diagnosis Same as Pre-procedure Notes 1 silver nitrate stick used Electronic Signature(s) Signed: 10/12/2020 5:22:15 PM By: Gretta Cool, BSN, RN, CWS, Kim RN, BSN Previous Signature: 09/25/2020 5:03:53 PM Version By: Georges Mouse, Minus Breeding RN Entered By: Gretta Cool, BSN, RN, CWS, Kim on 10/12/2020 17:22:15  Chad [name], Chad [name] ([id number]) -------------------------------------------------------------------------------- Physical Exam Details Patient Name: Chad [name], Chad [name] Date of Service: 09/25/2020 9:00 AM Medical Record Number: [id number] Patient Account Number: [id number] Date of Birth/Sex: 10/09/1960 (60 y.o.  M) Treating RN: Dolan Amen Primary Care Provider: Clayborn Bigness Other Clinician: Referring Provider: Clayborn Bigness Treating Provider/Extender: Jeri Cos Weeks in Treatment: 6 Constitutional Well-nourished and well-hydrated in no acute distress. Respiratory normal breathing without difficulty. Psychiatric this patient is able to make decisions and demonstrates good insight into disease process. Alert and Oriented x 3. pleasant and cooperative. Notes Upon inspection patient's wound bed actually showed signs of good granulation epithelization for the most part there is a little bit of hyper granulation in regard to the left ankle region. With that being said I do think that a little bit of silver nitrate could help in this regard I did perform chemical cauterization with silver nitrate using 1 stick on the left ankle today he tolerated that with no pain. We will see how this looks in a couple weeks. In regard to the right toe and heel I think that the collagen is doing a good job. Electronic Signature(s) Signed: 09/25/2020 10:03:03 AM By: Worthy Keeler PA-C Entered By: Worthy Keeler on 09/25/2020 10:03:03 Chad [name] ([id number]) -------------------------------------------------------------------------------- Physician Orders Details Patient Name: Chad [name] Date of Service: 09/25/2020 9:00 AM Medical Record Number: [id number] Patient Account Number: [id number] Date of Birth/Sex: May 17, 1961 (59 y.o. 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 Wound #7 Right Calcaneus o Return Appointment in 2 weeks. Wound #8 Left,Medial Ankle o Return Appointment in 2 weeks. Bathing/ Shower/ Hygiene Wound #6 Right Toe Second o Clean wound with Normal Saline or wound cleanser. o May shower; gently cleanse wound with antibacterial soap, rinse and pat dry prior to dressing wounds Wound #7 Right Calcaneus o Clean wound with Normal Saline or wound cleanser. o May shower; gently cleanse wound with antibacterial soap, rinse and pat dry prior to dressing wounds Wound #8 Left,Medial Ankle o Clean wound with Normal Saline or wound cleanser. o May shower; gently cleanse wound with antibacterial soap, rinse and pat dry prior to dressing wounds Wound Treatment Wound #6 - Toe Second Wound Laterality: Right Cleanser: Normal Saline 1 x Per Day/30 Days Discharge Instructions: Wash your hands with soap and water. Remove old dressing, discard into plastic bag and place into trash. Cleanse the wound with Normal Saline prior to applying a clean dressing using gauze sponges, not tissues or cotton balls. Do not scrub or use excessive force. Pat dry using gauze sponges, not tissue or cotton balls. Primary Dressing: Prisma 4.34 (in) 1 x Per Day/30 Days Discharge Instructions: Moisten w/normal saline or sterile water; Cover wound as directed. Do not remove from wound bed. Secondary Dressing: Coverlet Latex-Free Fabric Adhesive Dressings 1 x Per Day/30 Days Discharge Instructions: Secure just the wounded toe Wound #7 - Calcaneus Wound Laterality: Right Cleanser: Normal Saline 1 x Per Day/30 Days Discharge Instructions: [facility]  your hands with soap and water. Remove old dressing, discard into plastic bag and  place into trash. Cleanse the wound with Normal Saline prior to applying a clean dressing using gauze sponges, not tissues or cotton balls. Do not scrub or use excessive force. Pat dry using gauze sponges, not tissue or cotton balls. Primary Dressing: Prisma 4.34 (in) 1 x Per Day/30 Days Discharge Instructions: Moisten w/normal saline or sterile water; Cover wound as directed. Do not remove from wound bed. Secondary Dressing: Coverlet Latex-Free Fabric Adhesive Dressings 1 x Per Day/30 Days Chad [name], Chad [name] ([id number]) Discharge Instructions: 1.5 x 2 Wound #8 - Ankle Wound Laterality: Left, Medial Cleanser: Normal Saline 1 x Per Day/30 Days Discharge Instructions: Wash your hands with soap and water. Remove old dressing, discard into plastic bag and place into trash. Cleanse the wound with Normal Saline prior to applying a clean dressing using gauze sponges, not tissues or cotton balls. Do not scrub or use excessive force. Pat dry using gauze sponges, not tissue or cotton balls. Peri-Wound Care: Desitin Maximum Strength Ointment 4 (oz) 1 x Per Day/30 Days Primary Dressing: Silvercel Small 2x2 (in/in) 1 x Per Day/30 Days Discharge Instructions: Apply Silvercel Small 2x2 (in/in) as instructed Secondary Dressing: ABD Pad 5x9 (in/in) 1 x Per Day/30 Days Discharge Instructions: Cover with ABD pad Secondary Dressing: Kerlix 4.5 x 4.1 (in/yd) 1 x Per Day/30 Days Discharge Instructions: Apply Kerlix 4.5 x 4.1 (in/yd) as instructed Secured With: 55M Medipore H Soft Cloth Surgical Tape, 2x2 (in/yd) 1 x Per Day/30 Days Electronic Signature(s) Signed: 09/25/2020 4:50:13 PM By: Worthy Keeler PA-C Signed: 09/25/2020 5:03:53 PM By: Georges Mouse, Minus Breeding RN Entered By: Georges Mouse, Minus Breeding on 09/25/2020 10:00:03 Chad [name] ([id number]) -------------------------------------------------------------------------------- Problem List Details Patient Name: Chad [name] Date of Service: 09/25/2020 9:00  AM Medical Record Number: [id number] Patient Account Number: [id number] Date of Birth/Sex: [birth year] (60 y.o. M) Treating RN: Dolan Amen Primary Care Provider: Clayborn Bigness Other Clinician: Referring Provider: Clayborn Bigness Treating Provider/Extender: Skipper Cliche in Treatment: 6 Active Problems ICD-10 Encounter Code Description Active Date MDM Diagnosis I87.2 Venous insufficiency (chronic) (peripheral) 08/14/2020 No Yes L97.322 Non-pressure chronic ulcer of left ankle with fat layer exposed 08/14/2020 No Yes L97.412 Non-pressure chronic ulcer of right heel and midfoot with fat layer 08/14/2020 No Yes exposed L97.512 Non-pressure chronic ulcer of other part of right foot with fat layer 08/14/2020 No Yes exposed I69.354 Hemiplegia and hemiparesis following cerebral infarction affecting left 08/14/2020 No Yes non-dominant side Inactive Problems Resolved Problems Electronic Signature(s) Signed: 09/25/2020 9:54:08 AM By: Worthy Keeler PA-C Entered By: Worthy Keeler on 09/25/2020 09:54:08 Chad [name] ([id number]) -------------------------------------------------------------------------------- Progress Note Details Patient Name: Chad [name] Date of Service: 09/25/2020 9:00 AM Medical Record Number: [id number] Patient Account Number: [id number] Date of Birth/Sex: [birth year] (59 y.o. M) Treating RN: Dolan Amen Primary Care Provider: Clayborn Bigness Other Clinician: Referring Provider: Clayborn Bigness Treating Provider/Extender: Skipper Cliche in Treatment: 6 Subjective Chief Complaint Information obtained from Patient Left ankle and right foot and heel ulcers History of Present Illness (HPI) 10/08/18 on evaluation today patient actually presents to our office for initial evaluation concerning wounds that he has of the bilateral lower extremities. 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 He also has bilateral venous stasis. He apparently has been homeless currently following discharge from the hospital apparently he  has been placed at [facility] which is is a skilled nursing facility locally. Nonetheless fortunately he does not show any signs of infection at this time which is good news. In fact several of the wound actually appears to be showing some signs of improvement already in my pinion. There are a couple areas in the left leg in particular there likely gonna require some sharp debridement to help clear away some necrotic tissue and help with more sufficient healing. No fevers, chills, nausea, or vomiting noted at this time. 10/15/18 on evaluation today patient actually appears to be doing very well in regard to his bilateral lower extremities. He's been tolerating the dressing changes without complication. Fortunately there does not appear to be any evidence of active infection at this time which is great news. Overall I'm actually very pleased with how this has progressed in just one visits time. Readmission: 08/14/2020 upon evaluation today patient presents for re-evaluation here in our clinic. He is having issues with his left ankle region as well as his right toe and his right heel. He tells me that the toe and heel actually began as a area that was itching that he was scratching and then subsequently opened up into wounds. These may have been abscess areas I presume based on what I am seeing currently. With regard to his left ankle region he tells me this was a similar type occurrence although he does have venous stasis this very well may be more of a venous leg ulcer more than anything. Nonetheless I do believe that the patient would benefit from appropriate and aggressive wound care to try to help get things under better control here. He does have history of a stroke on the left  side affecting him to some degree there that he is able to stand although he does have some residual weakness. Otherwise again the patient does have chronic venous insufficiency as previously noted. His arterial studies most recently obtained showed that he had an ABI on the right of 1.16 with a TBI of 0.52 and on the left and ABI of 1.14 with a TBI of 0.81. That was obtained on 06/19/2020. 08/28/2020 upon evaluation today patient appears to be doing decently well in regard to his wounds in general. He has been tolerating the dressing changes without complication. Fortunately there does not appear to be any signs of active infection which is great news. With that being said I think the [facility] is doing a good job I would recommend that we likely continue with that currently. 09/11/2020 upon evaluation today patient's wounds did not appear to be doing too poorly but again he is not really showing signs of significant improvement with regard to any of the wounds on the right. None of them have Hydrofera Blue on them I am not exactly sure why this is not being followed as the facility did not contact us to let us know of any issues with obtaining dressings or otherwise. With that being said he is supposed to be using Hydrofera Blue on both of the wounds on the right foot as well as the ankle wound on the left side. 09/18/2020 upon evaluation today patient appears to be doing poorly with regard to his wounds. Again right now the left ankle in particular showed signs of extreme maceration. Apparently he was told by someone with staff at [facility] they could not get the [facility]. With that being said this is something that is never been relayed to  [place] one way or another. Also the patient subsequently has not supposed to have a border gauze dressing on. He should have an ABD pad and roll gauze to secure as this drains much too much just to have a border gauze dressing to cover. Nonetheless  the fact that they are not using the appropriate dressing is directly causing deterioration of the left ankle wound it is significantly worse today compared to what it was previous. I did attempt to call [facility] while the patient was here I called three times and got no one to even pick up the phone. After this I had my for an office coordinator call and she was able to finally get through and leave a message with the D ON as of dictation of this note which is roughly about an hour and a half later I still have not been able to speak with anyone at the facility. 09/25/2020 upon evaluation today patient actually showing signs of good improvement which is excellent news. He has been tolerating the dressing changes without complication. Fortunately there is no signs of active infection which is great news. No fevers, chills, nausea, vomiting, or diarrhea. I do feel like the facility has been doing a much better job at taking care of him as far as the dressings are concerned. However the director of nursing never did call me back. Objective JOURDON, ZIMMERLE ([id number]) Constitutional Well-nourished and well-hydrated in no acute distress. Vitals Time Taken: 9:10 AM, Height: 67 in, Weight: 160 lbs, BMI: 25.1, Temperature: 98.2 F, Pulse: 78 bpm, Respiratory Rate: 18 breaths/min, Blood Pressure: 132/85 mmHg. Respiratory normal breathing without difficulty. Psychiatric this patient is able to make decisions and demonstrates good insight into disease process. Alert and Oriented x 3. pleasant and cooperative. General Notes: Upon inspection patient's wound bed actually showed signs of good granulation epithelization for the most part there is a little bit of hyper granulation in regard to the left ankle region. With that being said I do think that a little bit of silver nitrate could help in this regard I did perform chemical cauterization with silver nitrate using 1 stick on the left ankle  today he tolerated that with no pain. We will see how this looks in a couple weeks. In regard to the right toe and heel I think that the collagen is doing a good job. Integumentary (Hair, Skin) Wound #6 status is Open. Original cause of wound was Gradually Appeared. The date acquired was: 03/28/2020. The wound has been in treatment 6 weeks. The wound is located on the Right Toe Second. The wound measures 1.4cm length x 0.4cm width x 0.2cm depth; 0.44cm^2 area and 0.088cm^3 volume. There is Fat Layer (Subcutaneous Tissue) exposed. There is no tunneling or undermining noted. There is a small amount of serosanguineous drainage noted. There is medium (34-66%) red granulation within the wound bed. There is no necrotic tissue within the wound bed. Wound #7 status is Open. Original cause of wound was Gradually Appeared. The date acquired was: 08/29/2019. The wound has been in treatment 6 weeks. The wound is located on the Right Calcaneus. The wound measures 0.5cm length x 0.3cm width x 0.1cm depth; 0.118cm^2 area and 0.012cm^3 volume. There is Fat Layer (Subcutaneous Tissue) exposed. There is no tunneling or undermining noted. There is a none present amount of drainage noted. There is no granulation within the wound bed. There is a small (1-33%) amount of necrotic tissue within the wound bed including Eschar. Wound #8 status is  Open. Original cause of wound was Gradually Appeared. The date acquired was: 07/12/2019. The wound has been in treatment 6 weeks. The wound is located on the Left,Medial Ankle. The wound measures 5.5cm length x 2.6cm width x 0.1cm depth; 11.231cm^2 area and 1.123cm^3 volume. There is Fat Layer (Subcutaneous Tissue) exposed. There is no tunneling or undermining noted. There is a medium amount of serosanguineous drainage noted. There is small (1-33%) red, pink granulation within the wound bed. There is a large (67-100%) amount of necrotic tissue within the wound bed including Adherent  Slough. 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 An CHEM CAUT GRANULATION TISS procedure was performed by Tommie Sams., PA-C. Post procedure Diagnosis Wound #8: Same as Pre-Procedure Notes: 1 silver nitrate stick used Plan Follow-up Appointments: Wound #6 Right Toe Second: Return Appointment in 2 weeks. Chad [name], Chad [name] ([id number]) Wound #7 Right Calcaneus: Return Appointment in 2 weeks. Wound #8 Left,Medial Ankle: Return Appointment in 2 weeks. Bathing/ Shower/ Hygiene: Wound #6 Right Toe Second: Clean wound with Normal Saline or wound cleanser. May shower; gently cleanse wound with antibacterial soap, rinse and pat dry prior to dressing wounds Wound #7 Right Calcaneus: Clean wound with Normal Saline or wound cleanser. May shower; gently cleanse wound with antibacterial soap, rinse and pat dry prior to dressing wounds Wound #8 Left,Medial Ankle: Clean wound with Normal Saline or wound cleanser. May shower; gently cleanse wound with antibacterial soap, rinse and pat dry prior to dressing wounds WOUND #6: - Toe Second Wound Laterality: Right Cleanser: Normal Saline 1 x Per Day/30 Days Discharge Instructions: Wash your hands with soap and water. Remove old dressing, discard into plastic bag and place into trash. Cleanse the wound with Normal Saline prior to applying a clean dressing using gauze sponges, not tissues or cotton balls. Do not scrub or use excessive force. Pat dry using gauze sponges, not tissue or cotton  balls. Primary Dressing: Prisma 4.34 (in) 1 x Per Day/30 Days Discharge Instructions: Moisten w/normal saline or sterile water; Cover wound as directed. Do not remove from wound bed. Secondary Dressing: Coverlet Latex-Free Fabric Adhesive Dressings 1 x Per Day/30 Days Discharge Instructions: Secure just the wounded toe WOUND #7: - Calcaneus Wound Laterality: Right Cleanser: Normal Saline 1 x Per Day/30 Days Discharge Instructions: Wash your hands with soap and water. Remove old dressing, discard into plastic bag and place into trash. Cleanse the wound with Normal Saline prior to applying a clean dressing using gauze sponges, not tissues or cotton balls. Do not scrub or use excessive force. Pat dry using gauze sponges, not tissue or cotton balls. Primary Dressing: Prisma 4.34 (in) 1 x Per Day/30 Days Discharge Instructions: Moisten w/normal saline or sterile water; Cover wound as directed. Do not remove from wound bed. Secondary Dressing: Coverlet Latex-Free Fabric Adhesive Dressings 1 x Per Day/30 Days Discharge Instructions: 1.5 x 2 WOUND #8: - Ankle Wound Laterality: Left, Medial Cleanser: Normal Saline 1 x Per Day/30 Days Discharge Instructions: Wash your hands with soap and water. Remove old dressing, discard into plastic bag and place into trash. Cleanse the wound with Normal Saline prior to applying  a clean dressing using gauze sponges, not tissues or cotton balls. Do not scrub or use excessive force. Pat dry using gauze sponges, not tissue or cotton balls. Peri-Wound Care: Desitin Maximum Strength Ointment 4 (oz) 1 x Per Day/30 Days Primary Dressing: Silvercel Small 2x2 (in/in) 1 x Per Day/30 Days Discharge Instructions: Apply Silvercel Small 2x2 (in/in) as instructed Secondary Dressing: ABD Pad 5x9 (in/in) 1 x Per Day/30 Days Discharge Instructions: Cover with ABD pad Secondary Dressing: Kerlix 4.5 x 4.1 (in/yd) 1 x Per Day/30 Days Discharge Instructions: Apply Kerlix 4.5 x 4.1  (in/yd) as instructed Secured With: 65M Medipore H Soft Cloth Surgical Tape, 2x2 (in/yd) 1 x Per Day/30 Days 1. Would recommend currently that we continue with the collagen followed by a coverlet/Band-Aid for the right heel and right toe. 2. For the left heel I would recommend that we continue with the silver alginate and then again were using an ABD pad and roll gauze to secure in place. This should not be an adhesive dressing. 3. I am also can recommend this continue to be changed on a regular basis in order to keep things clean and moving in the right direction. We will see patient back for reevaluation in 2 weeks here in the clinic. If anything worsens or changes patient will contact our office for additional recommendations. Electronic Signature(s) Signed: 10/12/2020 5:22:34 PM By: Gretta Cool, BSN, RN, CWS, Kim RN, BSN Signed: 10/15/2020 4:49:49 PM By: Worthy Keeler PA-C Previous Signature: 09/25/2020 10:03:46 AM Version By: Worthy Keeler PA-C Entered By: Gretta Cool BSN, RN, CWS, Kim on 10/12/2020 17:22:34 Chad [name] ([id number]) -------------------------------------------------------------------------------- SuperBill Details Patient Name: Chad [name] Date of Service: 09/25/2020 Medical Record Number: [id number] Patient Account Number: [id number] Date of Birth/Sex: 10/17/1960 (59 y.o. 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 part of right foot with fat layer exposed 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  side Facility Procedures CPT4 Code: 94801655 Description: 37482 - CHEM CAUT GRANULATION TISS Modifier: Quantity: 1 CPT4 Code: Description: ICD-10 Diagnosis Description L97.322 Non-pressure chronic ulcer of left ankle with fat layer exposed Modifier: Quantity: Physician Procedures CPT4 Code: 7078675 Description: 99213 - WC PHYS LEVEL 3 - EST PT Modifier: 25 Quantity: 1 CPT4 Code: Description: ICD-10 Diagnosis Description I87.2 Venous insufficiency (chronic) (peripheral) L97.322 Non-pressure chronic ulcer of left ankle with fat layer exposed L97.412 Non-pressure chronic ulcer of right heel and midfoot with fat layer expos L97.512  Non-pressure chronic ulcer of other part of right foot with fat layer exp Modifier: ed osed Quantity: CPT4 Code: 4492010 Description: 07121 - WC PHYS CHEM CAUT GRAN TISSUE Modifier: Quantity: 1 CPT4 Code: Description: ICD-10 Diagnosis Description 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

## 2020-09-26 NOTE — Progress Notes (Signed)
CAMEREN, EARNEST ([id number]) Visit Report for 09/25/2020 Arrival Information Details Patient Name: Chad [name], Chad [name] Date of Service: 09/25/2020 9:00 AM Medical Record Number: [id number] Patient Account Number: [id number] Date of Birth/Sex: 02-Nov-1960 (59 y.o. Male) Treating RN: Dolan Amen Primary Care Provider: Clayborn Bigness Other Clinician: Referring Provider: Clayborn Bigness Treating Provider/Extender: Skipper Cliche in Treatment: 6 Visit Information History Since Last Visit Added or deleted any medications: No Patient Arrived: Wheel Chair Had a fall or experienced change in No Arrival Time: 09:06 activities of daily living that may affect Accompanied By: self risk of falls: Transfer Assistance: None Hospitalized since last visit: No Patient Identification Verified: Yes Pain Present Now: Yes Secondary Verification Process Completed: Yes Patient Requires Transmission-Based No Precautions: Patient Has Alerts: Yes Patient Alerts: Patient on Blood Thinner ABI right 1.16 ABI left 1.14 Electronic Signature(s) Signed: 09/25/2020 2:13:05 PM By: Jeanine Luz Entered By: Jeanine Luz on 09/25/2020 09:11:28 Chad [name] ([id number]) -------------------------------------------------------------------------------- Encounter Discharge Information Details Patient Name: Chad [name] Date of Service: 09/25/2020 9:00 AM Medical Record Number: [id number] Patient Account Number: [id number] Date of Birth/Sex: 1960-12-23 (59 y.o. Male) Treating RN: Dolan Amen Primary Care Provider: Clayborn Bigness Other Clinician: Referring Provider: Clayborn Bigness Treating Provider/Extender: Skipper Cliche in Treatment: 6 Encounter Discharge Information Items Discharge Condition: Stable Ambulatory Status: Wheelchair Discharge Destination: Skilled Nursing Facility Orders Sent: Yes Transportation: Private Auto Accompanied By: self Schedule Follow-up Appointment: Yes Clinical Summary of  Care: Electronic Signature(s) Signed: 09/25/2020 5:03:53 PM By: Georges Mouse, Minus Breeding RN Entered By: Georges Mouse, Minus Breeding on 09/25/2020 10:01:34 Chad [name] ([id number]) -------------------------------------------------------------------------------- Lower Extremity Assessment Details Patient Name: Chad [name] Date of Service: 09/25/2020 9:00 AM Medical Record Number: [id number] Patient Account Number: [id number] Date of Birth/Sex: [birth year] (59 y.o. Male) Treating RN: Dolan Amen Primary Care Provider: Clayborn Bigness Other Clinician: Referring Provider: Clayborn Bigness Treating Provider/Extender: Jeri Cos Weeks in Treatment: 6 Edema Assessment Assessed: [Left: Yes] [Right: Yes] [Left: Edema] [Right: :] Calf Left: Right: Point of Measurement: 33 cm From Medial Instep 30 cm 33 cm Ankle Left: Right: Point of Measurement: 10 cm From Medial Instep 22.4 cm 20.4 cm Vascular Assessment Pulses: Dorsalis Pedis Palpable: [Left:Yes] [Right:Yes] Electronic Signature(s) Signed: 09/25/2020 2:13:05 PM By: Jeanine Luz Signed: 09/25/2020 5:03:53 PM By: Georges Mouse, Minus Breeding RN Entered By: Jeanine Luz on 09/25/2020 09:29:44 Chad [name] ([id number]) -------------------------------------------------------------------------------- Multi Wound Chart Details Patient Name: Chad [name] Date of Service: 09/25/2020 9:00 AM Medical Record Number: [id number] Patient Account Number: [id number] Date of Birth/Sex: [birth year] (59 y.o. Male) Treating RN: Dolan Amen Primary Care Provider: Clayborn Bigness Other Clinician: Referring Provider: Clayborn Bigness Treating Provider/Extender: Skipper Cliche in Treatment: 6 Vital Signs Height(in): 19 Pulse(bpm): 41 Weight(lbs): 160 Blood Pressure(mmHg): 132/85 Body Mass Index(BMI): 25 Temperature(F): 98.2 Respiratory Rate(breaths/min): 18 Photos: Wound Location: Right Toe Second Right Calcaneus Left, Medial Ankle Wounding Event: Gradually  Appeared Gradually Appeared Gradually Appeared Primary Etiology: Abscess Abscess Venous Leg Ulcer Comorbid History: Anemia, Chronic Obstructive Anemia, Chronic Obstructive Anemia, Chronic Obstructive Pulmonary Disease (COPD), Pulmonary Disease (COPD), Pulmonary Disease (COPD), Hepatitis C, Received Hepatitis C, Received Hepatitis C, Received Chemotherapy Chemotherapy Chemotherapy Date Acquired: 03/28/2020 08/29/2019 07/12/2019 Weeks of Treatment: 6 6 6  Wound Status: Open Open Open Measurements L x W x D (cm) 1.4x0.4x0.2 0.5x0.3x0.1 5.5x2.6x0.1 Area (cm) : 0.44 0.118 11.231 Volume (cm) : 0.088 0.012 1.123 % Reduction in Area: 63.40% 72.70% -90.70% % Reduction in Volume: 26.70% 72.10% -90.70% Classification: Full Thickness Without Exposed Full Thickness Without Exposed Full Thickness Without Exposed Support Structures Support Structures Support Structures  Exudate Amount: Small None Present Medium Exudate Type: Serosanguineous N/A Serosanguineous Exudate Color: red, brown N/A red, brown Granulation Amount: Medium (34-66%) None Present (0%) Small (1-33%) Granulation Quality: Red N/A Red, Pink Necrotic Amount: None Present (0%) Small (1-33%) Large (67-100%) Necrotic Tissue: N/A Eschar Adherent Slough Exposed Structures: Fat Layer (Subcutaneous Tissue): Fat Layer (Subcutaneous Tissue): Fat Layer (Subcutaneous Tissue): Yes Yes Yes Fascia: No Fascia: No Fascia: No Tendon: No Tendon: No Tendon: No Muscle: No Muscle: No Muscle: No Joint: No Joint: No Joint: No Bone: No Bone: No Bone: No Epithelialization: None None Small (1-33%) Treatment Notes Electronic Signature(s) Signed: 09/25/2020 5:03:53 PM By: Georges Mouse, Minus Breeding RN Entered By: Georges Mouse, Minus Breeding on 09/25/2020 09:56:41 Chad [name] ([id number]) -------------------------------------------------------------------------------- Multi-Disciplinary Care Plan Details Patient Name: Chad [name] Date of Service:  09/25/2020 9:00 AM Medical Record Number: [id number] Patient Account Number: [id number] Date of Birth/Sex: 10-Jun-1961 (60 y.o. Male) Treating RN: Dolan Amen Primary Care Provider: Clayborn Bigness Other Clinician: Referring Provider: Clayborn Bigness Treating Provider/Extender: Skipper Cliche in Treatment: 6 Active Inactive Wound/Skin Impairment Nursing Diagnoses: Knowledge deficit related to ulceration/compromised skin integrity Goals: Patient/caregiver will verbalize understanding of skin care regimen Date Initiated: 08/14/2020 Target Resolution Date: 09/14/2020 Goal Status: Active Ulcer/skin breakdown will have a volume reduction of 30% by week 4 Date Initiated: 08/14/2020 Target Resolution Date: 09/14/2020 Goal Status: Active Interventions: Assess patient/caregiver ability to obtain necessary supplies Assess patient/caregiver ability to perform ulcer/skin care regimen upon admission and as needed Assess ulceration(s) every visit Notes: Electronic Signature(s) Signed: 09/25/2020 5:03:53 PM By: Georges Mouse, Minus Breeding RN Entered By: Georges Mouse, Minus Breeding on 09/25/2020 09:56:33 Chad [name] ([id number]) -------------------------------------------------------------------------------- Pain Assessment Details Patient Name: Chad [name] Date of Service: 09/25/2020 9:00 AM Medical Record Number: [id number] Patient Account Number: [id number] Date of Birth/Sex: 08/03/1960 (60 y.o. Male) Treating RN: Dolan Amen Primary Care Provider: Clayborn Bigness Other Clinician: Referring Provider: Clayborn Bigness Treating Provider/Extender: Skipper Cliche in Treatment: 6 Active Problems Location of Pain Severity and Description of Pain Patient Has Paino Yes Site Locations Rate the pain. Current Pain Level: 8 Pain Management and Medication Current Pain Management: Electronic Signature(s) Signed: 09/25/2020 2:13:05 PM By: Jeanine Luz Signed: 09/25/2020 5:03:53 PM By: Georges Mouse, Minus Breeding  RN Entered By: Jeanine Luz on 09/25/2020 09:12:03 Chad [name] ([id number]) -------------------------------------------------------------------------------- Patient/Caregiver Education Details Patient Name: Chad [name] Date of Service: 09/25/2020 9:00 AM Medical Record Number: [id number] Patient Account Number: [id number] Date of Birth/Gender: 1961-01-17 (60 y.o. Male) Treating RN: Dolan Amen Primary Care Physician: Clayborn Bigness Other Clinician: Referring Physician: Clayborn Bigness Treating Physician/Extender: Skipper Cliche in Treatment: 6 Education Assessment Education Provided To: Patient Education Topics Provided Wound/Skin Impairment: Methods: Explain/Verbal Responses: State content correctly Electronic Signature(s) Signed: 09/25/2020 5:03:53 PM By: Georges Mouse, Minus Breeding RN Entered By: Georges Mouse, Minus Breeding on 09/25/2020 10:01:03 Chad [name] ([id number]) -------------------------------------------------------------------------------- Wound Assessment Details Patient Name: Chad [name] Date of Service: 09/25/2020 9:00 AM Medical Record Number: [id number] Patient Account Number: [id number] Date of Birth/Sex: November 21, 1960 (59 y.o. Male) Treating RN: Dolan Amen Primary Care Provider: Clayborn Bigness Other Clinician: Referring Provider: Clayborn Bigness Treating Provider/Extender: Jeri Cos Weeks in Treatment: 6 Wound Status Wound Number: 6 Primary Abscess Etiology: Wound Location: Right Toe Second Wound Open Wounding Event: Gradually Appeared Status: Date Acquired: 03/28/2020 Comorbid Anemia, Chronic Obstructive Pulmonary Disease (COPD), Weeks Of Treatment: 6 History: Hepatitis C, Received Chemotherapy Clustered Wound: No Photos Wound Measurements Length: (cm) 1.4 Width: (cm) 0.4 Depth: (cm) 0.2 Area: (cm) 0.44 Volume: (cm) 0.088 % Reduction in Area: 63.4% %  Reduction in Volume: 26.7% Epithelialization: None Tunneling: No Undermining: No Wound  Description Classification: Full Thickness Without Exposed Support Structures Exudate Amount: Small Exudate Type: Serosanguineous Exudate Color: red, brown Foul Odor After Cleansing: No Slough/Fibrino No Wound Bed Granulation Amount: Medium (34-66%) Exposed Structure Granulation Quality: Red Fascia Exposed: No Necrotic Amount: None Present (0%) Fat Layer (Subcutaneous Tissue) Exposed: Yes Tendon Exposed: No Muscle Exposed: No Joint Exposed: No Bone Exposed: No Treatment Notes Wound #6 (Toe Second) Wound Laterality: Right Cleanser Normal Saline Discharge Instruction: Wash your hands with soap and water. Remove old dressing, discard into plastic bag and place into trash. Cleanse the wound with Normal Saline prior to applying a clean dressing using gauze sponges, not tissues or cotton balls. Do not scrub or use excessive force. Pat dry using gauze sponges, not tissue or cotton balls. Chad [name], Chad [name] ([id number]) Peri-Wound Care Topical Primary Dressing Prisma 4.34 (in) Discharge Instruction: Moisten w/normal saline or sterile water; Cover wound as directed. Do not remove from wound bed. Secondary Dressing Coverlet Latex-Free Fabric Adhesive Dressings Discharge Instruction: Secure just the wounded toe Secured With Compression Wrap Compression Stockings Add-Ons Electronic Signature(s) Signed: 09/25/2020 2:13:05 PM By: Jeanine Luz Signed: 09/25/2020 5:03:53 PM By: Georges Mouse, Minus Breeding RN Entered By: Jeanine Luz on 09/25/2020 09:26:56 Chad [name] ([id number]) -------------------------------------------------------------------------------- Wound Assessment Details Patient Name: Chad [name] Date of Service: 09/25/2020 9:00 AM Medical Record Number: [id number] Patient Account Number: [id number] Date of Birth/Sex: 1960-09-14 (60 y.o. Male) Treating RN: Dolan Amen Primary Care Provider: Clayborn Bigness Other Clinician: Referring Provider: Clayborn Bigness Treating  Provider/Extender: Skipper Cliche in Treatment: 6 Wound Status Wound Number: 7 Primary Abscess Etiology: Wound Location: Right Calcaneus Wound Open Wounding Event: Gradually Appeared Status: Date Acquired: 08/29/2019 Comorbid Anemia, Chronic Obstructive Pulmonary Disease (COPD), Weeks Of Treatment: 6 History: Hepatitis C, Received Chemotherapy Clustered Wound: No Photos Wound Measurements Length: (cm) 0.5 Width: (cm) 0.3 Depth: (cm) 0.1 Area: (cm) 0.118 Volume: (cm) 0.012 % Reduction in Area: 72.7% % Reduction in Volume: 72.1% Epithelialization: None Tunneling: No Undermining: No Wound Description Classification: Full Thickness Without Exposed Support Structures Exudate Amount: None Present Foul Odor After Cleansing: No Slough/Fibrino No Wound Bed Granulation Amount: None Present (0%) Exposed Structure Necrotic Amount: Small (1-33%) Fascia Exposed: No Necrotic Quality: Eschar Fat Layer (Subcutaneous Tissue) Exposed: Yes Tendon Exposed: No Muscle Exposed: No Joint Exposed: No Bone Exposed: No Treatment Notes Wound #7 (Calcaneus) Wound Laterality: Right Cleanser Normal Saline Discharge Instruction: Wash your hands with soap and water. Remove old dressing, discard into plastic bag and place into trash. Cleanse the wound with Normal Saline prior to applying a clean dressing using gauze sponges, not tissues or cotton balls. Do not scrub or use excessive force. Pat dry using gauze sponges, not tissue or cotton balls. Peri-Wound Care CLIF, SERIO ([id number]) Topical Primary Dressing Prisma 4.34 (in) Discharge Instruction: Moisten w/normal saline or sterile water; Cover wound as directed. Do not remove from wound bed. Secondary Dressing Coverlet Latex-Free Fabric Adhesive Dressings Discharge Instruction: 1.5 x 2 Secured With Compression Wrap Compression Stockings Add-Ons Electronic Signature(s) Signed: 09/25/2020 2:13:05 PM By: Jeanine Luz Signed:  09/25/2020 5:03:53 PM By: Georges Mouse, Minus Breeding RN Entered By: Jeanine Luz on 09/25/2020 09:27:38 Chad [name] ([id number]) -------------------------------------------------------------------------------- Wound Assessment Details Patient Name: Chad [name] Date of Service: 09/25/2020 9:00 AM Medical Record Number: [id number] Patient Account Number: [id number] Date of Birth/Sex: 10/29/1960 (60 y.o. Male) Treating RN: Dolan Amen Primary Care Provider: Clayborn Bigness Other Clinician: Referring Provider: Clayborn Bigness Treating Provider/Extender: Skipper Cliche  in Treatment: 6 Wound Status Wound Number: 8 Primary Venous Leg Ulcer Etiology: Wound Location: Left, Medial Ankle Wound Open Wounding Event: Gradually Appeared Status: Date Acquired: 07/12/2019 Comorbid Anemia, Chronic Obstructive Pulmonary Disease (COPD), Weeks Of Treatment: 6 History: Hepatitis C, Received Chemotherapy Clustered Wound: No Photos Wound Measurements Length: (cm) 5.5 Width: (cm) 2.6 Depth: (cm) 0.1 Area: (cm) 11.231 Volume: (cm) 1.123 % Reduction in Area: -90.7% % Reduction in Volume: -90.7% Epithelialization: Small (1-33%) Tunneling: No Undermining: No Wound Description Classification: Full Thickness Without Exposed Support Structures Exudate Amount: Medium Exudate Type: Serosanguineous Exudate Color: red, brown Foul Odor After Cleansing: No Slough/Fibrino Yes Wound Bed Granulation Amount: Small (1-33%) Exposed Structure Granulation Quality: Red, Pink Fascia Exposed: No Necrotic Amount: Large (67-100%) Fat Layer (Subcutaneous Tissue) Exposed: Yes Necrotic Quality: Adherent Slough Tendon Exposed: No Muscle Exposed: No Joint Exposed: No Bone Exposed: No Treatment Notes Wound #8 (Ankle) Wound Laterality: Left, Medial Cleanser Normal Saline Discharge Instruction: Wash your hands with soap and water. Remove old dressing, discard into plastic bag and place into trash. Cleanse the  wound with Normal Saline prior to applying a clean dressing using gauze sponges, not tissues or cotton balls. Do not scrub or use excessive force. Pat dry using gauze sponges, not tissue or cotton balls. Chad [name], Chad [name] ([id number]) Peri-Wound Care Desitin Maximum Strength Ointment 4 (oz) Topical Primary Dressing Silvercel Small 2x2 (in/in) Discharge Instruction: Apply Silvercel Small 2x2 (in/in) as instructed Secondary Dressing ABD Pad 5x9 (in/in) Discharge Instruction: Cover with ABD pad Kerlix 4.5 x 4.1 (in/yd) Discharge Instruction: Apply Kerlix 4.5 x 4.1 (in/yd) as instructed Secured With 44M [employer] Surgical Tape, 2x2 (in/yd) Compression Wrap Compression Stockings Add-Ons Electronic Signature(s) Signed: 09/25/2020 2:13:05 PM By: Jeanine Luz Signed: 09/25/2020 5:03:53 PM By: Georges Mouse, Minus Breeding RN Entered By: Jeanine Luz on 09/25/2020 09:28:30 Chad [name] ([id number]) -------------------------------------------------------------------------------- Vitals Details Patient Name: Chad [name] Date of Service: 09/25/2020 9:00 AM Medical Record Number: [id number] Patient Account Number: [id number] Date of Birth/Sex: 1960-10-19 (60 y.o. Male) Treating RN: Dolan Amen Primary Care Provider: Clayborn Bigness Other Clinician: Referring Provider: Clayborn Bigness Treating Provider/Extender: Skipper Cliche in Treatment: 6 Vital Signs Time Taken: 09:10 Temperature (F): 98.2 Height (in): 67 Pulse (bpm): 78 Weight (lbs): 160 Respiratory Rate (breaths/min): 18 Body Mass Index (BMI): 25.1 Blood Pressure (mmHg): 132/85 Reference Range: 80 - 120 mg / dl Electronic Signature(s) Signed: 09/25/2020 2:13:05 PM By: Jeanine Luz Entered By: Jeanine Luz on 09/25/2020 09:11:51

## 2020-10-08 ENCOUNTER — Inpatient Hospital Stay (HOSPITAL_BASED_OUTPATIENT_CLINIC_OR_DEPARTMENT_OTHER): Payer: Medicaid Other | Admitting: Internal Medicine

## 2020-10-08 ENCOUNTER — Encounter: Payer: Self-pay | Admitting: Internal Medicine

## 2020-10-08 ENCOUNTER — Other Ambulatory Visit: Payer: Self-pay

## 2020-10-08 ENCOUNTER — Ambulatory Visit: Payer: Medicaid Other | Admitting: Gastroenterology

## 2020-10-08 ENCOUNTER — Inpatient Hospital Stay: Payer: Medicaid Other | Attending: Internal Medicine

## 2020-10-08 ENCOUNTER — Inpatient Hospital Stay: Payer: Medicaid Other

## 2020-10-08 DIAGNOSIS — Z5112 Encounter for antineoplastic immunotherapy: Secondary | ICD-10-CM | POA: Diagnosis not present

## 2020-10-08 DIAGNOSIS — Z7982 Long term (current) use of aspirin: Secondary | ICD-10-CM | POA: Diagnosis not present

## 2020-10-08 DIAGNOSIS — Z515 Encounter for palliative care: Secondary | ICD-10-CM

## 2020-10-08 DIAGNOSIS — Z8673 Personal history of transient ischemic attack (TIA), and cerebral infarction without residual deficits: Secondary | ICD-10-CM | POA: Diagnosis not present

## 2020-10-08 DIAGNOSIS — C182 Malignant neoplasm of ascending colon: Secondary | ICD-10-CM

## 2020-10-08 DIAGNOSIS — I10 Essential (primary) hypertension: Secondary | ICD-10-CM | POA: Insufficient documentation

## 2020-10-08 DIAGNOSIS — D509 Iron deficiency anemia, unspecified: Secondary | ICD-10-CM | POA: Insufficient documentation

## 2020-10-08 DIAGNOSIS — Z8042 Family history of malignant neoplasm of prostate: Secondary | ICD-10-CM | POA: Diagnosis not present

## 2020-10-08 DIAGNOSIS — C661 Malignant neoplasm of right ureter: Secondary | ICD-10-CM | POA: Insufficient documentation

## 2020-10-08 DIAGNOSIS — Z79899 Other long term (current) drug therapy: Secondary | ICD-10-CM | POA: Insufficient documentation

## 2020-10-08 DIAGNOSIS — Z8546 Personal history of malignant neoplasm of prostate: Secondary | ICD-10-CM | POA: Insufficient documentation

## 2020-10-08 DIAGNOSIS — Z8051 Family history of malignant neoplasm of kidney: Secondary | ICD-10-CM | POA: Diagnosis not present

## 2020-10-08 DIAGNOSIS — Z808 Family history of malignant neoplasm of other organs or systems: Secondary | ICD-10-CM | POA: Diagnosis not present

## 2020-10-08 DIAGNOSIS — Z1509 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: Secondary | ICD-10-CM

## 2020-10-08 DIAGNOSIS — D500 Iron deficiency anemia secondary to blood loss (chronic): Secondary | ICD-10-CM

## 2020-10-08 DIAGNOSIS — Z806 Family history of leukemia: Secondary | ICD-10-CM | POA: Insufficient documentation

## 2020-10-08 DIAGNOSIS — F17210 Nicotine dependence, cigarettes, uncomplicated: Secondary | ICD-10-CM | POA: Diagnosis not present

## 2020-10-08 DIAGNOSIS — Z7189 Other specified counseling: Secondary | ICD-10-CM

## 2020-10-08 LAB — CBC WITH DIFFERENTIAL/PLATELET
Abs Immature Granulocytes: 0.02 10*3/uL (ref 0.00–0.07)
Basophils Absolute: 0.1 10*3/uL (ref 0.0–0.1)
Basophils Relative: 1 %
Eosinophils Absolute: 0.4 10*3/uL (ref 0.0–0.5)
Eosinophils Relative: 4 %
HCT: 36.7 % — ABNORMAL LOW (ref 39.0–52.0)
Hemoglobin: 11.5 g/dL — ABNORMAL LOW (ref 13.0–17.0)
Immature Granulocytes: 0 %
Lymphocytes Relative: 17 %
Lymphs Abs: 1.3 10*3/uL (ref 0.7–4.0)
MCH: 23 pg — ABNORMAL LOW (ref 26.0–34.0)
MCHC: 31.3 g/dL (ref 30.0–36.0)
MCV: 73.4 fL — ABNORMAL LOW (ref 80.0–100.0)
Monocytes Absolute: 0.6 10*3/uL (ref 0.1–1.0)
Monocytes Relative: 8 %
Neutro Abs: 5.5 10*3/uL (ref 1.7–7.7)
Neutrophils Relative %: 70 %
Platelets: 224 10*3/uL (ref 150–400)
RBC: 5 MIL/uL (ref 4.22–5.81)
RDW: 16.3 % — ABNORMAL HIGH (ref 11.5–15.5)
WBC: 7.9 10*3/uL (ref 4.0–10.5)
nRBC: 0 % (ref 0.0–0.2)

## 2020-10-08 LAB — COMPREHENSIVE METABOLIC PANEL
ALT: 20 U/L (ref 0–44)
AST: 37 U/L (ref 15–41)
Albumin: 3.6 g/dL (ref 3.5–5.0)
Alkaline Phosphatase: 109 U/L (ref 38–126)
Anion gap: 8 (ref 5–15)
BUN: 23 mg/dL (ref 8–23)
CO2: 25 mmol/L (ref 22–32)
Calcium: 8.7 mg/dL — ABNORMAL LOW (ref 8.9–10.3)
Chloride: 103 mmol/L (ref 98–111)
Creatinine, Ser: 1 mg/dL (ref 0.61–1.24)
GFR, Estimated: >60 mL/min (ref >60)
Glucose, Bld: 99 mg/dL (ref 70–99)
Potassium: 4 mmol/L (ref 3.5–5.1)
Sodium: 136 mmol/L (ref 135–145)
Total Bilirubin: 0.6 mg/dL (ref 0.3–1.2)
Total Protein: 7.9 g/dL (ref 6.5–8.1)

## 2020-10-08 MED ORDER — IRON SUCROSE 20 MG/ML IV SOLN
200.0000 mg | Freq: Once | INTRAVENOUS | Status: AC
  Administered 2020-10-08: 200 mg via INTRAVENOUS
  Filled 2020-10-08: qty 10

## 2020-10-08 MED ORDER — SODIUM CHLORIDE 0.9 % IV SOLN
Freq: Once | INTRAVENOUS | Status: AC
  Filled 2020-10-08: qty 250

## 2020-10-08 MED ORDER — HEPARIN SOD (PORK) LOCK FLUSH 100 UNIT/ML IV SOLN
500.0000 [IU] | Freq: Once | INTRAVENOUS | Status: AC | PRN
  Administered 2020-10-08: 500 [IU]
  Filled 2020-10-08: qty 5

## 2020-10-08 MED ORDER — SODIUM CHLORIDE 0.9% FLUSH
10.0000 mL | Freq: Once | INTRAVENOUS | Status: AC
  Administered 2020-10-08: 10 mL via INTRAVENOUS
  Filled 2020-10-08: qty 10

## 2020-10-08 MED ORDER — HEPARIN SOD (PORK) LOCK FLUSH 100 UNIT/ML IV SOLN
500.0000 [IU] | Freq: Once | INTRAVENOUS | Status: DC
  Filled 2020-10-08: qty 5

## 2020-10-08 MED ORDER — SODIUM CHLORIDE 0.9 % IV SOLN
200.0000 mg | Freq: Once | INTRAVENOUS | Status: AC
  Administered 2020-10-08: 200 mg via INTRAVENOUS
  Filled 2020-10-08: qty 8

## 2020-10-08 MED ORDER — HEPARIN SOD (PORK) LOCK FLUSH 100 UNIT/ML IV SOLN
INTRAVENOUS | Status: AC
  Filled 2020-10-08: qty 5

## 2020-10-08 NOTE — Assessment & Plan Note (Addendum)
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 JAN 5th, 2022- CT findings worrisome for progressive right renal pelvis and ureteral tumor and mass suspicious for colonic neoplasm involving the descending colon sigmoid colon junction region [see below].  Mild soft tissue thickening around the distal right ureter could reflect ureteral tumor; FEB 2022-  RIGHT renal pelvis and bladder are not evaluable by FDG PET imaging due to high activity of radiotracer within the urine. STABLE.   # proceed with  [place] today; Labs today reviewed; Labs today reviewed;  acceptable for treatment today.   #Descending colon mass/intermittent constipation-PET scan- FEB 2022-uptake in the cecum;  [history of Lynch syndrome].  Canceled- sec to blood thinner- Awaiting colonoscopy on 3/24.    # Iron deficiency anemia-hemoglobin 11.5; February 2022 ron studies/ferritin-LOW; plan Venofer with infusions;  STABLE.   # Bilateral LE ulcers-s/p wound care evaluation; s/p PTCA with Dr.Dew. bil Arterial Dopp-wnl; STABLE [Dr.]  #DISPOSITION: #  keytruda/venofer today;  # follow up in 3 weeks-;MD labs-cbc/cmp;  Keytruda; venofer- Dr.B

## 2020-10-08 NOTE — Progress Notes (Signed)
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  Is alone.  Chad [name] 60 y.o.  male above history of stage IV-ureteral cancer/with multiple co-morbidities currently on Keytruda is here for follow-up.  Patient's colonoscopy for a cecal mass was postponed because patient was on blood thinners.  Is currently awaiting colonoscopy on March 24.  He denies any new ulcers on his feet.  Denies any new lumps or bumps.  Denies any blood in stools or black or stools.  Chronic fatigue.  Review of Systems  Constitutional: Positive for malaise/fatigue. Negative for chills, diaphoresis, fever and weight loss.  HENT: Negative for nosebleeds and sore throat.   Eyes: Negative for double vision.  Respiratory: Positive for cough and shortness of breath. Negative for hemoptysis and wheezing.   Cardiovascular: Negative for chest pain, palpitations and orthopnea.  Gastrointestinal: Positive for constipation. Negative for abdominal  pain, blood in stool, diarrhea, heartburn, melena, nausea and vomiting.  Genitourinary: Negative for dysuria, frequency and urgency.  Musculoskeletal: Positive for back pain and joint pain.  Skin: Negative.  Negative for itching and rash.  Neurological: Positive for focal weakness. Negative for dizziness, tingling, weakness and headaches.       Chronic left-sided weakness upper than lower extremity.  Endo/Heme/Allergies: Does not bruise/bleed easily.  Psychiatric/Behavioral: Negative for depression. The patient is not nervous/anxious and does not have insomnia.      MEDICAL HISTORY:  Past Medical History:  Diagnosis Date  . Anemia   . Anxiety   . ARF (acute respiratory failure) ([place])   . Bladder cancer ([place])   . COPD (chronic obstructive pulmonary disease) ([place])   . Depression   . Dysphagia   . Family history of colon cancer   . Family history of kidney cancer   . Family history of leukemia   . Family history of prostate cancer   . GERD (gastroesophageal reflux disease)   . Hepatitis    chronic hep c  . Hydronephrosis   . Hydronephrosis with ureteral stricture   . Hyperlipidemia   . Knee pain    Left  . Malignant neoplasm of colon ([place])   . Nerve pain   . Peripheral vascular disease ([place])   . Prostate cancer ([place])   . Stroke ([place])   . Urinary frequency   . Venous hypertension of both lower extremities     SURGICAL HISTORY: Past Surgical History:  Procedure Laterality Date  . COLON SURGERY     En bloc extended right hemicolectomy 07/2017  .  CYSTOSCOPY W/ RETROGRADES Right 08/30/2018   Procedure: CYSTOSCOPY WITH RETROGRADE PYELOGRAM;  Surgeon: Hollice Espy, MD;  Location: [facility];  Service: Urology;  Laterality: Right;  . CYSTOSCOPY WITH STENT PLACEMENT Right 04/25/2018   Procedure: CYSTOSCOPY WITH STENT PLACEMENT;  Surgeon: Hollice Espy, MD;  Location: [facility];  Service: Urology;  Laterality: Right;  . CYSTOSCOPY WITH STENT PLACEMENT Right 08/30/2018   Procedure:  [place] WITH STENT Exchange;  Surgeon: Hollice Espy, MD;  Location: [facility];  Service: Urology;  Laterality: Right;  . CYSTOSCOPY WITH STENT PLACEMENT Right 03/07/2019   Procedure: CYSTOSCOPY WITH STENT Exchange;  Surgeon: Hollice Espy, MD;  Location: [facility];  Service: Urology;  Laterality: Right;  . CYSTOSCOPY WITH STENT PLACEMENT Right 11/21/2019   Procedure: CYSTOSCOPY WITH STENT Exchange;  Surgeon: Hollice Espy, MD;  Location: [facility];  Service: Urology;  Laterality: Right;  . LOWER EXTREMITY ANGIOGRAPHY Left 05/23/2019   Procedure: LOWER EXTREMITY ANGIOGRAPHY;  Surgeon: Algernon Huxley, MD;  Location: [place] [facility];  Service: Cardiovascular;  Laterality: Left;  . LOWER EXTREMITY ANGIOGRAPHY Right 05/30/2019   Procedure: LOWER EXTREMITY ANGIOGRAPHY;  Surgeon: Algernon Huxley, MD;  Location: [place] [facility];  Service: Cardiovascular;  Laterality: Right;  . LOWER EXTREMITY ANGIOGRAPHY Right 02/13/2020   Procedure: LOWER EXTREMITY ANGIOGRAPHY;  Surgeon: Algernon Huxley, MD;  Location: [place] [facility];  Service: Cardiovascular;  Laterality: Right;  . LOWER EXTREMITY ANGIOGRAPHY Left 02/20/2020   Procedure: LOWER EXTREMITY ANGIOGRAPHY;  Surgeon: Algernon Huxley, MD;  Location: [place] [facility];  Service: Cardiovascular;  Laterality: Left;  . PORTA CATH INSERTION N/A 02/28/2019   Procedure: PORTA CATH INSERTION;  Surgeon: Algernon Huxley, MD;  Location: [place] [facility];  Service: Cardiovascular;  Laterality: N/A;  . tumor removed       SOCIAL HISTORY: Social History   Socioeconomic History  . Marital status: Single    Spouse name: Not on file  . Number of children: Not on file  . Years of education: Not on file  . Highest education level: Not on file  Occupational History  . Not on file  Tobacco Use  . Smoking status: Current Every Day Smoker    Packs/day: 1.00    Types: Cigarettes  . Smokeless tobacco: Never Used  Vaping Use  . Vaping Use: Never used  Substance  and Sexual Activity  . Alcohol use: Not Currently  . Drug use: Not Currently  . Sexual activity: Not Currently  Other Topics Concern  . Not on file  Social History Narrative    used to live [place]; moved  To [place]- end of April 2019; in Nursing home; 1pp/day; quit alcohol. 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        Family- dad- prostate ca [at 41y]; brother- 40 died of prostate cancer; brother- 81- no cancers [[place]]; sonGerald Stabs [[place]];[name]-32y prostate ca [facility] [place]]; daughter- 75 [[place]]; another daughter 91 [[place]/addict]. will refer genetics counseling. Given MSI- abnormal; highly suspicious of Lynch syndrome.  Patient's son [name] aware of high possible lynch syndrome.   Social Determinants of Health   Financial Resource Strain: Not on file  Food Insecurity: Not on file  Transportation Needs: Not on file  Physical Activity: Not on file  Stress: Not on file  Social Connections: Not on file  Intimate Partner Violence: Not on file    FAMILY HISTORY: Family History  Problem Relation Age of Onset  . Prostate cancer Father 54  . Cancer  Brother 18       unsure type  . Cancer Paternal Uncle        unsure type  . Cancer Maternal Grandmother        unsure type  . Cancer Paternal Grandmother        unsure type  . Kidney cancer Paternal Grandfather   . Cancer Other        unsure types  . Leukemia Son   . Cancer Son        other cancers, possibly colon    ALLERGIES:  is allergic to penicillins.  MEDICATIONS:  Current Outpatient Medications  Medication Sig Dispense Refill  . acetaminophen (TYLENOL) 325 MG tablet Take 650 mg by mouth 3 (three) times daily.     Marland Kitchen aspirin EC 81 MG tablet Take 1 tablet (81 mg total) by mouth daily. 150 tablet 2  . Cholecalciferol (VITAMIN D) 50 MCG (2000 UT) CAPS Take by mouth.    . clopidogrel (PLAVIX) 75 MG tablet Take 1 tablet (75 mg total) by mouth daily. 30 tablet 11  . cyclobenzaprine (FLEXERIL) 10 MG tablet Take 10 mg by mouth at bedtime.     . diclofenac sodium (VOLTAREN) 1 % GEL Apply 2 g topically See admin instructions. Apply 2 g topically 4 times daily. Apply 2 g to left shoulder, apply 2 g to left hip and 2 g to left knee 4 times daily per [facility] instructions    . escitalopram (LEXAPRO) 5 MG tablet Take 5 mg by mouth daily.    Marland Kitchen gabapentin (NEURONTIN) 100 MG capsule Take 100 mg by mouth 3 (three) times daily.    . hydroxypropyl methylcellulose / hypromellose (ISOPTO TEARS / GONIOVISC) 2.5 % ophthalmic solution Place 1 drop into both eyes every 12 (twelve) hours.    . mirtazapine (REMERON) 7.5 MG tablet Take 7.5 mg by mouth at bedtime.    . Oxycodone HCl 10 MG TABS Take 10 mg by mouth every 6 (six) hours as needed for pain.    . pantoprazole (PROTONIX) 40 MG tablet Take 40 mg by mouth daily.    . Pollen Extracts (PROSTAT PO) Take 30 mLs by mouth.    . pravastatin (PRAVACHOL) 10 MG tablet Take 10 mg by mouth daily.    . RESTORE CALCIUM ALGINATE EX Apply topically. Note: Dressing located on left ankle. Facility cleaning wound "every day shift and applying wound cleanser and appication of calcium Alginate and cover with dry dressing and wrapped with Kerlix"    . atorvastatin (LIPITOR) 10 MG tablet Take 1 tablet (10 mg total) by mouth daily. 30 tablet 11  . ciprofloxacin (CIPRO) 500 MG tablet Take 500 mg by mouth 2 (two) times daily. (Patient not taking: No sig reported)     No current facility-administered medications for this visit.   Facility-Administered Medications Ordered in Other Visits  Medication Dose Route Frequency Provider Last Rate Last Admin  . heparin lock flush 100 unit/mL  500 Units Intravenous Once Charlaine Dalton R, MD          .  PHYSICAL EXAMINATION: ECOG PERFORMANCE STATUS: 1 - Symptomatic but completely ambulatory  Vitals:   10/08/20 0922  BP: 105/77  Pulse: 84  Resp: 16  Temp: 97.7 F (36.5 C)  SpO2: 99%   Filed Weights   10/08/20 0922  Weight: 150 lb (68 kg)    Physical  Exam Constitutional:      Comments: In wheelchair. Cachectic.  Appears older than his stated age.  Alone.  HENT:     Head: Normocephalic and atraumatic.     Mouth/Throat:     Pharynx: No oropharyngeal exudate.  Eyes:     Pupils: Pupils are equal, round, and reactive to light.  Cardiovascular:     Rate and Rhythm: Normal rate and regular rhythm.  Pulmonary:     Effort: No respiratory distress.     Breath sounds: No wheezing.  Abdominal:     General: Bowel sounds are normal. There is no distension.     Palpations: Abdomen is soft. There is no mass.     Tenderness: There is no abdominal tenderness. There is no guarding or rebound.  Musculoskeletal:        General: No tenderness. Normal range of motion.     Cervical back: Normal range of motion and neck supple.  Skin:    General: Skin is warm.     Comments: Bilateral lower extremity ulcerations noted.  Pulses intact bilaterally.  Neurological:     Mental Status: He is oriented to person, place, and time.     Comments: Sleepy but easily arousable.  Chronic weakness left upper and lower extremity.  Psychiatric:        Mood and Affect: Affect normal.      LABORATORY DATA:  I have reviewed the data as listed Lab Results  Component Value Date   WBC 7.9 10/08/2020   HGB 11.5 (L) 10/08/2020   HCT 36.7 (L) 10/08/2020   MCV 73.4 (L) 10/08/2020   PLT 224 10/08/2020   Recent Labs    03/08/20 0905 03/29/20 0824 04/19/20 0858 05/10/20 0915 08/27/20 0946 09/17/20 0925 10/08/20 0900  NA 137 139 134*   < > 132* 136 136  K 4.4 4.1 3.8   < > 3.8 3.9 4.0  CL 103 104 101   < > 102 102 103  CO2 _0 < > _1 GLUCOSE 88 94 140*   < > 120* 149* 99  BUN _2 < > _3 CREATININE 0.85 0.76 0.92   < > 0.97 0.83 1.00  CALCIUM 8.8* 8.6* 8.4*   < > 8.6* 8.5* 8.7*  GFRNONAA >60 >60 >60   < > >60 >60 >60  GFRAA >60 >60 >60  --   --   --   --   PROT 8.0 7.7 7.8   < > 8.1 7.4 7.9  ALBUMIN 3.8 3.6 3.7   < > 3.5 3.3* 3.6   AST 32 28 33   < > 29 37 37  ALT _4 < > _5 ALKPHOS 127* 118 115   < > 88 102 109  BILITOT 0.6 0.4 0.4   < > 0.7 0.5 0.6   < > = values in this interval not displayed.    RADIOGRAPHIC STUDIES: I have personally reviewed the radiological images as listed and agreed with the findings in the report. No results found.  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 JAN 5th, 2022- CT findings worrisome for progressive right renal pelvis and ureteral tumor and mass suspicious for colonic neoplasm involving the descending colon sigmoid colon junction region [see below].  Mild soft tissue thickening around the distal right ureter could reflect ureteral tumor; FEB 2022-  RIGHT renal pelvis and bladder are not evaluable by FDG PET imaging due to high activity  of radiotracer within the urine. STABLE.   # proceed with  [place] today; Labs today reviewed; Labs today reviewed;  acceptable for treatment today.   #Descending colon mass/intermittent constipation-PET scan- FEB 2022-uptake in the cecum;  [history of Lynch syndrome].  Canceled- sec to blood thinner- Awaiting colonoscopy on 3/24.    # Iron deficiency anemia-hemoglobin 11.5; February 2022 ron studies/ferritin-LOW; plan Venofer with infusions;  STABLE.   # Bilateral LE ulcers-s/p wound care evaluation; s/p PTCA with Dr.Dew. bil Arterial Dopp-wnl; STABLE [Dr.]  #DISPOSITION: #  keytruda/venofer today;  # follow up in 3 weeks-;MD labs-cbc/cmp;  Keytruda; venofer- Dr.B    All questions were answered. The patient knows to call the clinic with any problems, questions or concerns.    Cammie Sickle, MD 10/08/2020 9:49 AM

## 2020-10-09 ENCOUNTER — Other Ambulatory Visit: Payer: Self-pay

## 2020-10-09 ENCOUNTER — Encounter: Payer: Medicaid Other | Admitting: Physician Assistant

## 2020-10-09 DIAGNOSIS — I872 Venous insufficiency (chronic) (peripheral): Secondary | ICD-10-CM | POA: Diagnosis not present

## 2020-10-09 NOTE — Progress Notes (Addendum)
Chad, [name] ([id number]) Visit Report for 10/09/2020 Chief Complaint Document Details Patient Name: Chad [name], Chad [name] Date of Service: 10/09/2020 9:00 AM Medical Record Number: [id number] Patient Account Number: [id number] Date of Birth/Sex: [birth year] (60 y.o. M) Treating RN: Dolan Amen Primary Care Provider: Clayborn Bigness Other Clinician: Referring Provider: Clayborn Bigness Treating Provider/Extender: Skipper Cliche in Treatment: 8 Information Obtained from: Patient Chief Complaint Left ankle and right foot and heel ulcers Electronic Signature(s) Signed: 10/09/2020 9:01:07 AM By: Worthy Keeler PA-C Entered By: Worthy Keeler on 10/09/2020 09:01:07 Chad [name] ([id number]) -------------------------------------------------------------------------------- Debridement Details Patient Name: Chad [name] Date of Service: 10/09/2020 9:00 AM Medical Record Number: [id number] Patient Account Number: [id number] Date of Birth/Sex: Dec 23, 1960 (60 y.o. M) Treating RN: Dolan Amen Primary Care Provider: Clayborn Bigness Other Clinician: Referring Provider: Clayborn Bigness Treating Provider/Extender: Skipper Cliche in Treatment: 8 Debridement Performed for Wound #6 Right Toe Second Assessment: Performed By: Physician Tommie Sams., PA-C Debridement Type: Debridement Level of Consciousness (Pre- Awake and Alert procedure): Pre-procedure Verification/Time Out Yes - 09:27 Taken: Start Time: 09:27 Total Area Debrided (L x W): 1 (cm) x 0.3 (cm) = 0.3 (cm) Tissue and other material Viable, Non-Viable, Slough, Subcutaneous, Slough debrided: Level: Skin/Subcutaneous Tissue Debridement Description: Excisional Instrument: Curette Bleeding: Minimum Hemostasis Achieved: Pressure Response to Treatment: Procedure was tolerated well Level of Consciousness (Post- Awake and Alert procedure): Post Debridement Measurements of Total Wound Length: (cm) 1 Width: (cm) 0.3 Depth: (cm)  0.2 Volume: (cm) 0.047 Character of Wound/Ulcer Post Debridement: Stable Post Procedure Diagnosis Same as Pre-procedure Electronic Signature(s) Signed: 10/09/2020 4:39:22 PM By: Worthy Keeler PA-C Signed: 10/09/2020 4:53:32 PM By: Georges Mouse, Minus Breeding RN Entered By: Georges Mouse, Minus Breeding on 10/09/2020 09:27:51 Chad [name] ([id number]) -------------------------------------------------------------------------------- HPI Details Patient Name: Chad [name] Date of Service: 10/09/2020 9:00 AM Medical Record Number: [id number] Patient Account Number: [id number] Date of Birth/Sex: 05-06-1961 (60 y.o. M) Treating RN: Dolan Amen Primary Care Provider: Clayborn Bigness Other Clinician: Referring Provider: Clayborn Bigness Treating Provider/Extender: Skipper Cliche in Treatment: 8 History of Present Illness HPI Description: 10/08/18 on evaluation today patient actually presents to our office for initial evaluation concerning wounds that he has of the bilateral lower extremities. 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 He also has bilateral venous stasis. He apparently has been homeless currently following discharge from the hospital apparently he has been placed at [facility] which is is a skilled nursing facility locally. Nonetheless fortunately he does not show any signs of infection at this time which is good news. In fact several of the wound actually appears to be showing some signs of improvement already in my pinion. There are a couple areas in the left leg in particular there likely gonna require some sharp debridement to help clear away some necrotic tissue and help with more sufficient healing. No fevers, chills, nausea, or vomiting noted at this time. 10/15/18 on evaluation today patient actually appears to be doing  very well in regard to his bilateral lower extremities. He's been tolerating the dressing changes without complication. Fortunately there does not appear to be any evidence of active infection at this time which is great news. Overall I'm actually very pleased with how this has progressed in just one visits time. Readmission: 08/14/2020 upon evaluation today patient presents for re-evaluation here in our clinic. He is having issues with his  left ankle region as well as his right toe and his right heel. He tells me that the toe and heel actually began as a area that was itching that he was scratching and then subsequently opened up into wounds. These may have been abscess areas I presume based on what I am seeing currently. With regard to his left ankle region he tells me this was a similar type occurrence although he does have venous stasis this very well may be more of a venous leg ulcer more than anything. Nonetheless I do believe that the patient would benefit from appropriate and aggressive wound care to try to help get things under better control here. He does have history of a stroke on the left side affecting him to some degree there that he is able to stand although he does have some residual weakness. Otherwise again the patient does have chronic venous insufficiency as previously noted. His arterial studies most recently obtained showed that he had an ABI on the right of 1.16 with a TBI of 0.52 and on the left and ABI of 1.14 with a TBI of 0.81. That was obtained on 06/19/2020. 08/28/2020 upon evaluation today patient appears to be doing decently well in regard to his wounds in general. He has been tolerating the dressing changes without complication. Fortunately there does not appear to be any signs of active infection which is great news. With that being said I think the [facility] is doing a good job I would recommend that we likely continue with that currently. 09/11/2020 upon  evaluation today patient's wounds did not appear to be doing too poorly but again he is not really showing signs of significant improvement with regard to any of the wounds on the right. None of them have Hydrofera Blue on them I am not exactly sure why this is not being followed as the facility did not contact us to let us know of any issues with obtaining dressings or otherwise. With that being said he is supposed to be using Hydrofera Blue on both of the wounds on the right foot as well as the ankle wound on the left side. 09/18/2020 upon evaluation today patient appears to be doing poorly with regard to his wounds. Again right now the left ankle in particular showed signs of extreme maceration. Apparently he was told by someone with staff at [place] they could not get the [facility]. With that being said this is something that is never been relayed to [place] one way or another. Also the patient subsequently has not supposed to have a border gauze dressing on. He should have an ABD pad and roll gauze to secure as this drains much too much just to have a border gauze dressing to cover. Nonetheless the fact that they are not using the appropriate dressing is directly causing deterioration of the left ankle wound it is significantly worse today compared to what it was previous. I did attempt to call [facility] while the patient was here I called three times and got no one to even pick up the phone. After this I had my for an office coordinator call and she was able to finally get through and leave a message with the D ON as of dictation of this note which is roughly about an hour and a half later I still have not been able to speak with anyone at the facility. 09/25/2020 upon evaluation today patient actually showing signs of good improvement which is excellent  news. He has been tolerating the dressing changes without complication. Fortunately there is no signs of active infection  which is great news. No fevers, chills, nausea, vomiting, or diarrhea. I do feel like the facility has been doing a much better job at taking care of him as far as the dressings are concerned. However the director of nursing never did call me back. 10/09/2020 upon evaluation today patient appears to be doing well with regard to his wound. The toe ulcer did require some debridement but the other 2 areas actually appear to be doing quite well. Electronic Signature(s) Signed: 10/09/2020 9:35:43 AM By: Worthy Keeler PA-C Entered By: Worthy Keeler on 10/09/2020 09:35:42 Chad [name] ([id number]) -------------------------------------------------------------------------------- Physical Exam Details Patient Name: Chad [name] Date of Service: 10/09/2020 9:00 AM Medical Record Number: [id number] Patient Account Number: [id number] Date of Birth/Sex: 05-14-1961 (60 y.o. M) Treating RN: Dolan Amen Primary Care Provider: Clayborn Bigness Other Clinician: Referring Provider: Clayborn Bigness Treating Provider/Extender: Jeri Cos Weeks in Treatment: 8 Constitutional Well-nourished and well-hydrated in no acute distress. Respiratory normal breathing without difficulty. Psychiatric this patient is able to make decisions and demonstrates good insight into disease process. Alert and Oriented x 3. pleasant and cooperative. Notes Patient's wound bed actually showed signs of good granulation epithelization at this point. Fortunately there is no sign of active infection at this time. The patient overall seems to be doing much better that we do need to be using some Desitin around the ankle wound to protect the good skin I think that the only thing I see otherwise I am very pleased with where we stand today. Electronic Signature(s) Signed: 10/09/2020 9:36:04 AM By: Worthy Keeler PA-C Entered By: Worthy Keeler on 10/09/2020 09:36:04 Chad [name]  ([id number]) -------------------------------------------------------------------------------- Physician Orders Details Patient Name: Chad [name] Date of Service: 10/09/2020 9:00 AM Medical Record Number: [id number] Patient Account Number: [id number] Date of Birth/Sex: 1961-02-16 (60 y.o. 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 Wound Treatment Wound #6 - Toe Second Wound Laterality: Right Cleanser: Normal Saline 1 x Per Day/30 Days Discharge Instructions: Wash your hands with soap and water. Remove old dressing, discard into plastic bag and place into trash. Cleanse the wound with Normal Saline prior to applying a clean dressing using gauze sponges, not tissues or cotton balls. Do not scrub or use excessive force. Pat dry using gauze sponges, not tissue or cotton balls. Primary Dressing: Prisma 4.34 (in) 1 x Per Day/30 Days Discharge Instructions: Moisten w/normal saline or sterile water; Cover wound as directed. Do not remove from wound bed. Secondary Dressing: Coverlet Latex-Free Fabric Adhesive Dressings 1 x Per Day/30 Days Discharge Instructions: Secure just the wounded toe Wound #7 - Calcaneus Wound Laterality: Right Cleanser: Normal Saline 1 x Per Day/30 Days Discharge Instructions: Wash your hands with soap and water. Remove old  dressing, discard into plastic bag and place into trash. Cleanse the wound with Normal Saline prior to applying a clean dressing using gauze sponges,  not tissues or cotton balls. Do not scrub or use excessive force. Pat dry using gauze sponges, not tissue or cotton balls. Primary Dressing: Prisma 4.34 (in) 1 x Per Day/30 Days Discharge Instructions: Moisten w/normal saline or sterile water; Cover wound as directed. Do not remove from wound bed. Secondary Dressing: Coverlet Latex-Free Fabric Adhesive Dressings 1 x Per Day/30 Days Discharge Instructions: 1.5 x 2 Wound #8 - Ankle Wound Laterality: Left, Medial Cleanser: Normal Saline 1 x Per Day/30 Days Discharge Instructions: Wash your hands with soap and water. Remove old dressing, discard into plastic bag and place into trash. Cleanse the wound with Normal Saline prior to applying a clean dressing using gauze sponges, not tissues or cotton balls. Do not scrub or use excessive force. Pat dry using gauze sponges, not tissue or cotton balls. Peri-Wound Care: Desitin Maximum Strength Ointment 4 (oz) 1 x Per Day/30 Days Discharge Instructions: Zinc periwound Primary Dressing: Silvercel Small 2x2 (in/in) 1 x Per Day/30 Days Discharge Instructions: Apply Silvercel Small 2x2 (in/in) as instructed Secondary Dressing: ABD Pad 5x9 (in/in) 1 x Per Day/30 Days Discharge Instructions: Cover with ABD pad Secondary Dressing: Kerlix 4.5 x 4.1 (in/yd) 1 x Per Day/30 Days Discharge Instructions: Apply Kerlix 4.5 x 4.1 (in/yd) as instructed ANDREA, COLGLAZIER ([id number]) Secured With: 58M Medipore H Soft Cloth Surgical Tape, 2x2 (in/yd) 1 x Per Day/30 Days Electronic Signature(s) Signed: 10/09/2020 4:39:22 PM By: Worthy Keeler PA-C Signed: 10/09/2020 4:53:32 PM By: Georges Mouse, Minus Breeding RN Entered By: Georges Mouse, Minus Breeding on 10/09/2020 09:29:56 Chad [name]  ([id number]) -------------------------------------------------------------------------------- Problem List Details Patient Name: Chad [name] Date of Service: 10/09/2020 9:00 AM Medical Record Number: [id number] Patient Account Number: [id number] Date of Birth/Sex: June 10, 1961 (60 y.o. M) Treating RN: Dolan Amen Primary Care Provider: Clayborn Bigness Other Clinician: Referring Provider: Clayborn Bigness Treating Provider/Extender: Skipper Cliche in Treatment: 8 Active Problems ICD-10 Encounter Code Description Active Date MDM Diagnosis I87.2 Venous insufficiency (chronic) (peripheral) 08/14/2020 No Yes L97.322 Non-pressure chronic ulcer of left ankle with fat layer exposed 08/14/2020 No Yes L97.412 Non-pressure chronic ulcer of right heel and midfoot with fat layer 08/14/2020 No Yes exposed L97.512 Non-pressure chronic ulcer of other part of right foot with fat layer 08/14/2020 No Yes exposed I69.354 Hemiplegia and hemiparesis following cerebral infarction affecting left 08/14/2020 No Yes non-dominant side Inactive Problems Resolved Problems Electronic Signature(s) Signed: 10/09/2020 9:01:00 AM By: Worthy Keeler PA-C Entered By: Worthy Keeler on 10/09/2020 09:00:59 Chad [name] ([id number]) -------------------------------------------------------------------------------- Progress Note Details Patient Name: Chad [name] Date of Service: 10/09/2020 9:00 AM Medical Record Number: [id number] Patient Account Number: [id number] Date of Birth/Sex: Dec 20, 1960 (60 y.o. M) Treating RN: Dolan Amen Primary Care Provider: Clayborn Bigness Other Clinician: Referring Provider: Clayborn Bigness Treating Provider/Extender: Skipper Cliche in Treatment: 8 Subjective Chief Complaint Information obtained from Patient Left ankle and right foot and heel ulcers History of Present Illness (HPI) 10/08/18 on evaluation today patient actually presents to our office for initial evaluation concerning wounds  that he has of the bilateral lower extremities. 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 He also has bilateral venous stasis. He apparently has been homeless currently following discharge from the hospital apparently he has been placed at [facility] which is is a skilled nursing facility locally. Nonetheless fortunately he does not show any signs of infection at this time which is  good news. In fact several of the wound actually appears to be showing some signs of improvement already in my pinion. There are a couple areas in the left leg in particular there likely gonna require some sharp debridement to help clear away some necrotic tissue and help with more sufficient healing. No fevers, chills, nausea, or vomiting noted at this time. 10/15/18 on evaluation today patient actually appears to be doing very well in regard to his bilateral lower extremities. He's been tolerating the dressing changes without complication. Fortunately there does not appear to be any evidence of active infection at this time which is great news. Overall I'm actually very pleased with how this has progressed in just one visits time. Readmission: 08/14/2020 upon evaluation today patient presents for re-evaluation here in our clinic. He is having issues with his left ankle region as well as his right toe and his right heel. He tells me that the toe and heel actually began as a area that was itching that he was scratching and then subsequently opened up into wounds. These may have been abscess areas I presume based on what I am seeing currently. With regard to his left ankle region he tells me this was a similar type occurrence although he does have venous stasis this very well may be more of a venous leg ulcer more than anything. Nonetheless I do believe that the  patient would benefit from appropriate and aggressive wound care to try to help get things under better control here. He does have history of a stroke on the left side affecting him to some degree there that he is able to stand although he does have some residual weakness. Otherwise again the patient does have chronic venous insufficiency as previously noted. His arterial studies most recently obtained showed that he had an ABI on the right of 1.16 with a TBI of 0.52 and on the left and ABI of 1.14 with a TBI of 0.81. That was obtained on 06/19/2020. 08/28/2020 upon evaluation today patient appears to be doing decently well in regard to his wounds in general. He has been tolerating the dressing changes without complication. Fortunately there does not appear to be any signs of active infection which is great news. With that being said I think the [facility] is doing a good job I would recommend that we likely continue with that currently. 09/11/2020 upon evaluation today patient's wounds did not appear to be doing too poorly but again he is not really showing signs of significant improvement with regard to any of the wounds on the right. None of them have Hydrofera Blue on them I am not exactly sure why this is not being followed as the facility did not contact us to let us know of any issues with obtaining dressings or otherwise. With that being said he is supposed to be using Hydrofera Blue on both of the wounds on the right foot as well as the ankle wound on the left side. 09/18/2020 upon evaluation today patient appears to be doing poorly with regard to his wounds. Again right now the left ankle in particular showed signs of extreme maceration. Apparently he was told by someone with staff at [place] they could not get the [facility]. With that being said this is something that is never been relayed to [place] one way or another. Also the patient subsequently has not supposed to have a  border gauze dressing on. He should have an ABD pad and roll gauze  to secure as this drains much too much just to have a border gauze dressing to cover. Nonetheless the fact that they are not using the appropriate dressing is directly causing deterioration of the left ankle wound it is significantly worse today compared to what it was previous. I did attempt to call [facility] while the patient was here I called three times and got no one to even pick up the phone. After this I had my for an office coordinator call and she was able to finally get through and leave a message with the D ON as of dictation of this note which is roughly about an hour and a half later I still have not been able to speak with anyone at the facility. 09/25/2020 upon evaluation today patient actually showing signs of good improvement which is excellent news. He has been tolerating the dressing changes without complication. Fortunately there is no signs of active infection which is great news. No fevers, chills, nausea, vomiting, or diarrhea. I do feel like the facility has been doing a much better job at taking care of him as far as the dressings are concerned. However the director of nursing never did call me back. 10/09/2020 upon evaluation today patient appears to be doing well with regard to his wound. The toe ulcer did require some debridement but the other 2 areas actually appear to be doing quite well. JAYDENCE, ARNESEN ([id number]) Objective Constitutional Well-nourished and well-hydrated in no acute distress. Vitals Time Taken: 8:56 AM, Height: 67 in, Weight: 160 lbs, BMI: 25.1, Temperature: 98.1 F, Pulse: 84 bpm, Respiratory Rate: 18 breaths/min, Blood Pressure: 96/64 mmHg. Respiratory normal breathing without difficulty. Psychiatric this patient is able to make decisions and demonstrates good insight into disease process. Alert and Oriented x 3. pleasant and cooperative. General Notes: Patient's wound  bed actually showed signs of good granulation epithelization at this point. Fortunately there is no sign of active infection at this time. The patient overall seems to be doing much better that we do need to be using some Desitin around the ankle wound to protect the good skin I think that the only thing I see otherwise I am very pleased with where we stand today. Integumentary (Hair, Skin) Wound #6 status is Open. Original cause of wound was Gradually Appeared. The date acquired was: 03/28/2020. The wound has been in treatment 8 weeks. The wound is located on the Right Toe Second. The wound measures 1cm length x 0.3cm width x 0.1cm depth; 0.236cm^2 area and 0.024cm^3 volume. There is Fat Layer (Subcutaneous Tissue) exposed. There is no tunneling or undermining noted. There is a none present amount of drainage noted. There is small (1-33%) red granulation within the wound bed. There is a medium (34-66%) amount of necrotic tissue within the wound bed including Adherent Slough. Wound #7 status is Open. Original cause of wound was Gradually Appeared. The date acquired was: 08/29/2019. The wound has been in treatment 8 weeks. The wound is located on the Right Calcaneus. The wound measures 0.1cm length x 0.1cm width x 0.1cm depth; 0.008cm^2 area and 0.001cm^3 volume. There is Fat Layer (Subcutaneous Tissue) exposed. There is a none present amount of drainage noted. There is no granulation within the wound bed. There is a large (67-100%) amount of necrotic tissue within the wound bed including Eschar. Wound #8 status is Open. Original cause of wound was Gradually Appeared. The date acquired was: 07/12/2019. The wound has been in treatment 8 weeks. The wound is located on  the Left,Medial Ankle. The wound measures 5cm length x 2.8cm width x 0.1cm depth; 10.996cm^2 area and 1.1cm^3 volume. There is Fat Layer (Subcutaneous Tissue) exposed. There is no tunneling or undermining noted. There is a large amount of  serous drainage noted. The wound margin is flat and intact. There is medium (34-66%) red, hyper - granulation within the wound bed. There is a medium (34-66%) amount of necrotic tissue within the wound bed including Adherent Slough. 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 There was a Excisional Skin/Subcutaneous Tissue Debridement with a total area of 0.3 sq cm performed by Tommie Sams., PA-C. With the following instrument(s): Curette to remove Viable and Non-Viable tissue/material. Material removed includes Subcutaneous Tissue and Slough and. A time out was conducted at 09:27, prior to the start of the procedure. A Minimum amount of bleeding was controlled with Pressure. The procedure was tolerated well. Post Debridement Measurements: 1cm length x 0.3cm width x 0.2cm depth; 0.047cm^3 volume. Character of Wound/Ulcer Post Debridement is stable. Post procedure Diagnosis Wound #6: Same as Pre-Procedure Plan NISHANT, SCHRECENGOST ([id number]) Follow-up Appointments: Return Appointment in 1 week. WOUND #6: - Toe Second Wound Laterality: Right Cleanser: Normal Saline 1 x Per Day/30 Days Discharge Instructions: Wash your hands with soap and water. Remove old dressing, discard into plastic bag and place into trash. Cleanse the wound with Normal Saline prior to applying a clean dressing using gauze sponges, not tissues or cotton balls. Do not scrub or use excessive force. Pat dry using gauze sponges, not tissue or cotton balls. Primary Dressing: Prisma 4.34  (in) 1 x Per Day/30 Days Discharge Instructions: Moisten w/normal saline or sterile water; Cover wound as directed. Do not remove from wound bed. Secondary Dressing: Coverlet Latex-Free Fabric Adhesive Dressings 1 x Per Day/30 Days Discharge Instructions: Secure just the wounded toe WOUND #7: - Calcaneus Wound Laterality: Right Cleanser: Normal Saline 1 x Per Day/30 Days Discharge Instructions: Wash your hands with soap and water. Remove old dressing, discard into plastic bag and place into trash. Cleanse the wound with Normal Saline prior to applying a clean dressing using gauze sponges, not tissues or cotton balls. Do not scrub or use excessive force. Pat dry using gauze sponges, not tissue or cotton balls. Primary Dressing: Prisma 4.34 (in) 1 x Per Day/30 Days Discharge Instructions: Moisten w/normal saline or sterile water; Cover wound as directed. Do not remove from wound bed. Secondary Dressing: Coverlet Latex-Free Fabric Adhesive Dressings 1 x Per Day/30 Days Discharge Instructions: 1.5 x 2 WOUND #8: - Ankle Wound Laterality: Left, Medial Cleanser: Normal Saline 1 x Per Day/30 Days Discharge Instructions: Wash your hands with soap and water. Remove old dressing, discard into plastic bag and place into trash. Cleanse the wound with Normal Saline prior to applying a clean dressing using gauze sponges, not tissues or cotton balls. Do not scrub or use excessive force. Pat dry using gauze sponges, not tissue or cotton balls. Peri-Wound Care: Desitin Maximum Strength Ointment 4 (oz) 1 x Per Day/30 Days Discharge Instructions: Zinc periwound Primary Dressing: Silvercel Small 2x2 (in/in) 1 x Per Day/30 Days Discharge Instructions: Apply Silvercel Small  2x2 (in/in) as instructed Secondary Dressing: ABD Pad 5x9 (in/in) 1 x Per Day/30 Days Discharge Instructions: Cover with ABD pad Secondary Dressing: Kerlix 4.5 x 4.1 (in/yd) 1 x Per Day/30 Days Discharge Instructions: Apply Kerlix 4.5 x 4.1  (in/yd) as instructed Secured With: 44M Medipore H Soft Cloth Surgical Tape, 2x2 (in/yd) 1 x Per Day/30 Days 1. I would recommend currently that we going continue with the wound care measures as before the patient is in agreement with the plan this includes the use of a silver alginate dressing for the left ankle after putting zinc around the edges of the wound. 2. I am also can recommend that we have the patient go ahead and continue with the ABD pad and roll gauze to secure here. 3. I am and I suggest that we continue with the silver collagen to the toe and heel ulcerations. This seems to be doing decently well we will continue the Cipro. We will see patient back for reevaluation in 1 week here in the clinic. If anything worsens or changes patient will contact our office for additional recommendations. Electronic Signature(s) Signed: 10/09/2020 9:36:54 AM By: Worthy Keeler PA-C Entered By: Worthy Keeler on 10/09/2020 09:36:54 Chad [name] ([id number]) -------------------------------------------------------------------------------- SuperBill Details Patient Name: Chad [name] Date of Service: 10/09/2020 Medical Record Number: [id number] Patient Account Number: [id number] Date of Birth/Sex: [birth year] (60 y.o. 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 part of right foot with fat layer exposed 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 side Facility Procedures CPT4 Code: [id number] Description: 97353 - DEB  SUBQ TISSUE 20 SQ CM/< Modifier: Quantity: 1 CPT4 Code: Description: ICD-10 Diagnosis Description L97.512 Non-pressure chronic ulcer of other part of right foot with fat layer exp Modifier: osed Quantity: Physician Procedures CPT4 Code: 2992426 Description: 11042 - WC PHYS SUBQ TISS 20 SQ CM Modifier: Quantity: 1 CPT4 Code: Description: ICD-10 Diagnosis Description L97.512 Non-pressure chronic ulcer of other part of right foot with fat layer exp Modifier: osed Quantity: Electronic Signature(s) Signed: 10/09/2020 9:37:06 AM By: Worthy Keeler PA-C Entered By: Worthy Keeler on 10/09/2020 09:37:06

## 2020-10-11 NOTE — Progress Notes (Signed)
Chad [name], Chad [name] ([id number]) Visit Report for 10/09/2020 Arrival Information Details Patient Name: Chad, [name] Date of Service: 10/09/2020 9:00 AM Medical Record Number: [id number] Patient Account Number: [id number] Date of Birth/Sex: [birth year] (60 y.o. M) Treating RN: Cornell Barman Primary Care Jaymian Bogart: Clayborn Bigness Other Clinician: Referring Aiyannah Fayad: Clayborn Bigness Treating Emmakate Hypes/Extender: Skipper Cliche in Treatment: 8 Visit Information History Since Last Visit Has Dressing in Place as Prescribed: Yes Patient Arrived: Ambulatory Pain Present Now: No Arrival Time: 08:54 Accompanied By: self Transfer Assistance: None Patient Identification Verified: Yes Secondary Verification Process Completed: Yes Patient Requires Transmission-Based No Precautions: Patient Has Alerts: Yes Patient Alerts: Patient on Blood Thinner ABI right 1.16 ABI left 1.14 Electronic Signature(s) Signed: 10/10/2020 6:32:03 PM By: Gretta Cool, BSN, RN, CWS, Kim RN, BSN Entered By: Gretta Cool, BSN, RN, CWS, Kim on 10/09/2020 08:56:16 Chad [name] ([id number]) -------------------------------------------------------------------------------- Clinic Level of Care Assessment Details Patient Name: Chad [name] Date of Service: 10/09/2020 9:00 AM Medical Record Number: [id number] Patient Account Number: [id number] Date of Birth/Sex: 08-27-1960 (60 y.o. M) Treating RN: Dolan Amen Primary Care Lamyiah Crawshaw: Clayborn Bigness Other Clinician: Referring Piper Albro: Clayborn Bigness Treating Sherryn Pollino/Extender: Skipper Cliche in Treatment: 8 Clinic Level of Care Assessment Items TOOL 1 Quantity Score _0  - Use when EandM and Procedure is performed on INITIAL visit 0 ASSESSMENTS - Nursing Assessment / Reassessment _1  - General Physical Exam (combine w/ comprehensive assessment (listed just below) when performed on new 0 pt. evals) _2  - 0 Comprehensive Assessment (HX, ROS, Risk Assessments, Wounds Hx, etc.) ASSESSMENTS - Wound  and Skin Assessment / Reassessment _3  - Dermatologic / Skin Assessment (not related to wound area) 0 ASSESSMENTS - Ostomy and/or Continence Assessment and Care _4  - Incontinence Assessment and Management 0 _5  - 0 Ostomy Care Assessment and Management (repouching, etc.) PROCESS - Coordination of Care _6  - Simple Patient / Family Education for ongoing care 0 _7  - 0 Complex (extensive) Patient / Family Education for ongoing care _8  - 0 Staff obtains Programmer, systems, Records, Test Results / Process Orders _9  - 0 Staff telephones HHA, Nursing Homes / Clarify orders / etc _10  - 0 Routine Transfer to another Facility (non-emergent condition) _11  - 0 Routine Hospital Admission (non-emergent condition) _12  - 0 New Admissions / Biomedical engineer / Ordering NPWT, Apligraf, etc. _13  - 0 Emergency Hospital Admission (emergent condition) PROCESS - Special Needs _14  - Pediatric / Minor Patient Management 0 _15  - 0 Isolation Patient Management _16  - 0 Hearing / Language / Visual special needs _17  - 0 Assessment of Community assistance (transportation, D/C planning, etc.) _18  - 0 Additional assistance / Altered mentation _19  - 0 Support Surface(s) Assessment (bed, cushion, seat, etc.) INTERVENTIONS - Miscellaneous _20  - External ear exam 0 _21  - 0 Patient Transfer (multiple staff / Civil Service fast streamer / Similar devices) _22  - 0 Simple Staple / Suture removal (25 or less) _23  - 0 Complex Staple / Suture removal (26 or more) _24  - 0 Hypo/Hyperglycemic Management (do not check if billed separately) _25  - 0 Ankle / Brachial Index (ABI) - do not check if billed separately Has the patient been seen at the hospital within the last three years: Yes Total Score: 0 Level Of Care: ____ Chad [name] ([id number]) Electronic Signature(s) Signed: 10/09/2020 4:53:32 PM By: Georges Mouse, Minus Breeding RN Entered By: Georges Mouse, Minus Breeding on 10/09/2020 09:30:09 Chad [name]  ([id number]) -------------------------------------------------------------------------------- Encounter Discharge Information Details Patient Name: Chad [name] Date of Service: 10/09/2020 9:00 AM Medical Record Number: [id number] Patient Account Number: [id number] Date of Birth/Sex: Dec 01, 1960 (60 y.o.  M) Treating RN: Dolan Amen Primary Care Deari Sessler: Clayborn Bigness Other Clinician: Referring Salma Walrond: Clayborn Bigness Treating Annika Selke/Extender: Skipper Cliche in Treatment: 8 Encounter Discharge Information Items Post Procedure Vitals Discharge Condition: Stable Temperature (F): 98.1 Ambulatory Status: Wheelchair Pulse (bpm): 84 Discharge Destination: [place] Respiratory Rate (breaths/min): 16 Orders Sent: Yes Blood Pressure (mmHg): 96/64 Transportation: Private Auto Accompanied By: self Schedule Follow-up Appointment: Yes Clinical Summary of Care: Electronic Signature(s) Signed: 10/09/2020 4:53:32 PM By: Georges Mouse, Minus Breeding RN Entered By: Georges Mouse, Minus Breeding on 10/09/2020 09:31:31 Chad [name] ([id number]) -------------------------------------------------------------------------------- Lower Extremity Assessment Details Patient Name: Chad [name] Date of Service: 10/09/2020 9:00 AM Medical Record Number: [id number] Patient Account Number: [id number] Date of Birth/Sex: [birth year] (60 y.o. M) Treating RN: Cornell Barman Primary Care Thane Age: Clayborn Bigness Other Clinician: Referring Britain Anagnos: Clayborn Bigness Treating Brittie Whisnant/Extender: Jeri Cos Weeks in Treatment: 8 Edema Assessment Assessed: [Left: Yes] [Right: Yes] Edema: [Left: No] [Right: No] Vascular Assessment Pulses: Dorsalis Pedis Palpable: [Left:Yes] [Right:Yes] Electronic Signature(s) Signed: 10/10/2020 6:32:03 PM By: Gretta Cool, BSN, RN, CWS, Kim RN, BSN Entered By: Gretta Cool, BSN, RN, CWS, Kim on 10/09/2020 09:13:57 Chad [name]  ([id number]) -------------------------------------------------------------------------------- Multi Wound Chart Details Patient Name: Chad [name] Date of Service: 10/09/2020 9:00 AM Medical Record Number: [id number] Patient Account Number: [id number] Date of Birth/Sex: 05/16/1961 (60 y.o. M) Treating RN: Dolan Amen Primary Care Hobson Lax: Clayborn Bigness Other Clinician: Referring Chrissi Crow: Clayborn Bigness Treating Lowanda Cashaw/Extender: Skipper Cliche in Treatment: 8 Vital Signs Height(in): 7 Pulse(bpm): 98 Weight(lbs): 160 Blood Pressure(mmHg): 96/64 Body Mass Index(BMI): 25 Temperature(F): 98.1 Respiratory Rate(breaths/min): 18 Photos: Wound Location: Right Toe Second Right Calcaneus Left, Medial Ankle Wounding Event: Gradually Appeared Gradually Appeared Gradually Appeared Primary Etiology: Abscess Abscess Venous Leg Ulcer Comorbid History: Anemia, Chronic Obstructive Anemia, Chronic Obstructive Anemia, Chronic Obstructive Pulmonary Disease (COPD), Pulmonary Disease (COPD), Pulmonary Disease (COPD), Hepatitis C, Received Hepatitis C, Received Hepatitis C, Received Chemotherapy Chemotherapy Chemotherapy Date Acquired: 03/28/2020 08/29/2019 07/12/2019 Weeks of Treatment: _0 Wound Status: Open Open Open Measurements L x W x D (cm) 1x0.3x0.1 0.1x0.1x0.1 5x2.8x0.1 Area (cm) : 0.236 0.008 10.996 Volume (cm) : 0.024 0.001 1.1 % Reduction in Area: 80.40% 98.10% -86.70% % Reduction in Volume: 80.00% 97.70% -86.80% Classification: Full Thickness Without Exposed Full Thickness Without Exposed Full Thickness Without Exposed Support Structures Support Structures Support Structures Exudate Amount: None Present None Present Large Exudate Type: N/A N/A Serous Exudate Color: N/A N/A amber Wound Margin: N/A N/A Flat and Intact Granulation Amount: Small (1-33%) None Present (0%) Medium (34-66%) Granulation Quality: Red N/A Red, Hyper-granulation Necrotic Amount: Medium (34-66%) Large  (67-100%) Medium (34-66%) Necrotic Tissue: Adherent [place] Exposed Structures: Fat Layer (Subcutaneous Tissue): Fat Layer (Subcutaneous Tissue): Fat Layer (Subcutaneous Tissue): Yes Yes Yes Fascia: No Fascia: No Fascia: No Tendon: No Tendon: No Tendon: No Muscle: No Muscle: No Muscle: No Joint: No Joint: No Joint: No Bone: No Bone: No Bone: No Epithelialization: None None Small (1-33%) Treatment Notes Electronic Signature(s) Signed: 10/09/2020 4:53:32 PM By: Georges Mouse, Minus Breeding RN Entered By: Georges Mouse, Minus Breeding on 10/09/2020 09:24:51 Chad [name] ([id number]) Chad [name] ([id number]) -------------------------------------------------------------------------------- Multi-Disciplinary Care Plan Details Patient Name: Chad [name] Date of Service: 10/09/2020 9:00 AM Medical Record Number: [id number] Patient Account Number: [id number] Date of Birth/Sex: 1960-10-21 (60 y.o. M) Treating RN: Dolan Amen Primary Care Luqman Perrelli: Clayborn Bigness Other Clinician: Referring Kyair Ditommaso: Clayborn Bigness Treating Tahiry Spicer/Extender: Skipper Cliche in Treatment: 8 Active Inactive Wound/Skin Impairment Nursing Diagnoses: Knowledge deficit related to ulceration/compromised skin integrity Goals: Patient/caregiver  will verbalize understanding of skin care regimen Date Initiated: 08/14/2020 Date Inactivated: 10/09/2020 Target Resolution Date: 09/14/2020 Goal Status: Met Ulcer/skin breakdown will have a volume reduction of 30% by week 4 Date Initiated: 08/14/2020 Target Resolution Date: 09/14/2020 Goal Status: Active Interventions: Assess patient/caregiver ability to obtain necessary supplies Assess patient/caregiver ability to perform ulcer/skin care regimen upon admission and as needed Assess ulceration(s) every visit Notes: Electronic Signature(s) Signed: 10/09/2020 4:53:32 PM By: Georges Mouse, Minus Breeding RN Entered By: Georges Mouse, Minus Breeding on 10/09/2020  09:24:31 Chad [name] ([id number]) -------------------------------------------------------------------------------- Pain Assessment Details Patient Name: Chad [name] Date of Service: 10/09/2020 9:00 AM Medical Record Number: [id number] Patient Account Number: [id number] Date of Birth/Sex: January 11, 1961 (60 y.o. M) Treating RN: Cornell Barman Primary Care Murphy Duzan: Clayborn Bigness Other Clinician: Referring Oliviarose Punch: Clayborn Bigness Treating Roniel Halloran/Extender: Skipper Cliche in Treatment: 8 Active Problems Location of Pain Severity and Description of Pain Patient Has Paino No Site Locations Pain Management and Medication Current Pain Management: Notes Patient denies pain at this time. Electronic Signature(s) Signed: 10/10/2020 6:32:03 PM By: Gretta Cool, BSN, RN, CWS, Kim RN, BSN Entered By: Gretta Cool, BSN, RN, CWS, Kim on 10/09/2020 08:57:23 Chad [name] ([id number]) -------------------------------------------------------------------------------- Patient/Caregiver Education Details Patient Name: Chad [name] Date of Service: 10/09/2020 9:00 AM Medical Record Number: [id number] Patient Account Number: [id number] Date of Birth/Gender: 06-19-1961 (60 y.o. M) Treating RN: Dolan Amen Primary Care Physician: Clayborn Bigness Other Clinician: Referring Physician: Clayborn Bigness Treating Physician/Extender: Skipper Cliche in Treatment: 8 Education Assessment Education Provided To: Patient Education Topics Provided Wound/Skin Impairment: Methods: Explain/Verbal Responses: State content correctly Electronic Signature(s) Signed: 10/09/2020 4:53:32 PM By: Georges Mouse, Minus Breeding RN Entered By: Georges Mouse, Minus Breeding on 10/09/2020 09:30:30 Chad [name] ([id number]) -------------------------------------------------------------------------------- Wound Assessment Details Patient Name: Chad [name] Date of Service: 10/09/2020 9:00 AM Medical Record Number: [id number] Patient Account Number:  [id number] Date of Birth/Sex: May 14, 1961 (60 y.o. M) Treating RN: Cornell Barman Primary Care Xandrea Clarey: Clayborn Bigness Other Clinician: Referring Percival Glasheen: Clayborn Bigness Treating Rahsaan Weakland/Extender: Jeri Cos Weeks in Treatment: 8 Wound Status Wound Number: 6 Primary Abscess Etiology: Wound Location: Right Toe Second Wound Open Wounding Event: Gradually Appeared Status: Date Acquired: 03/28/2020 Comorbid Anemia, Chronic Obstructive Pulmonary Disease (COPD), Weeks Of Treatment: 8 History: Hepatitis C, Received Chemotherapy Clustered Wound: No Photos Wound Measurements Length: (cm) 1 Width: (cm) 0.3 Depth: (cm) 0.1 Area: (cm) 0.236 Volume: (cm) 0.024 % Reduction in Area: 80.4% % Reduction in Volume: 80% Epithelialization: None Tunneling: No Undermining: No Wound Description Classification: Full Thickness Without Exposed Support Structures Exudate Amount: None Present Foul Odor After Cleansing: No Slough/Fibrino No Wound Bed Granulation Amount: Small (1-33%) Exposed Structure Granulation Quality: Red Fascia Exposed: No Necrotic Amount: Medium (34-66%) Fat Layer (Subcutaneous Tissue) Exposed: Yes Necrotic Quality: Adherent Slough Tendon Exposed: No Muscle Exposed: No Joint Exposed: No Bone Exposed: No Treatment Notes Wound #6 (Toe Second) Wound Laterality: Right Cleanser Normal Saline Discharge Instruction: Wash your hands with soap and water. Remove old dressing, discard into plastic bag and place into trash. Cleanse the wound with Normal Saline prior to applying a clean dressing using gauze sponges, not tissues or cotton balls. Do not scrub or use excessive force. Pat dry using gauze sponges, not tissue or cotton balls. Peri-Wound Care Chad [name], Chad [name] ([id number]) Topical Primary Dressing Prisma 4.34 (in) Discharge Instruction: Moisten w/normal saline or sterile water; Cover wound as directed. Do not remove from wound bed. Secondary Dressing Coverlet Latex-Free Fabric  Adhesive Dressings Discharge Instruction: Secure just the wounded toe Secured With Compression Wrap Compression Stockings Add-Ons  Electronic Signature(s) Signed: 10/10/2020 6:32:03 PM By: Gretta Cool, BSN, RN, CWS, Kim RN, BSN Entered By: Gretta Cool, BSN, RN, CWS, Kim on 10/09/2020 09:10:07 Chad [name] ([id number]) -------------------------------------------------------------------------------- Wound Assessment Details Patient Name: Chad [name] Date of Service: 10/09/2020 9:00 AM Medical Record Number: [id number] Patient Account Number: [id number] Date of Birth/Sex: 11-May-1961 (60 y.o. M) Treating RN: Cornell Barman Primary Care Zakeria Kulzer: Clayborn Bigness Other Clinician: Referring Sallee Hogrefe: Clayborn Bigness Treating Jacobo Moncrief/Extender: Jeri Cos Weeks in Treatment: 8 Wound Status Wound Number: 7 Primary Abscess Etiology: Wound Location: Right Calcaneus Wound Open Wounding Event: Gradually Appeared Status: Date Acquired: 08/29/2019 Comorbid Anemia, Chronic Obstructive Pulmonary Disease (COPD), Weeks Of Treatment: 8 History: Hepatitis C, Received Chemotherapy Clustered Wound: No Photos Wound Measurements Length: (cm) 0.1 Width: (cm) 0.1 Depth: (cm) 0.1 Area: (cm) 0.008 Volume: (cm) 0.001 % Reduction in Area: 98.1% % Reduction in Volume: 97.7% Epithelialization: None Wound Description Classification: Full Thickness Without Exposed Support Structures Exudate Amount: None Present Foul Odor After Cleansing: No Slough/Fibrino No Wound Bed Granulation Amount: None Present (0%) Exposed Structure Necrotic Amount: Large (67-100%) Fascia Exposed: No Necrotic Quality: Eschar Fat Layer (Subcutaneous Tissue) Exposed: Yes Tendon Exposed: No Muscle Exposed: No Joint Exposed: No Bone Exposed: No Treatment Notes Wound #7 (Calcaneus) Wound Laterality: Right Cleanser Normal Saline Discharge Instruction: Wash your hands with soap and water. Remove old dressing, discard into plastic bag and place  into trash. Cleanse the wound with Normal Saline prior to applying a clean dressing using gauze sponges, not tissues or cotton balls. Do not scrub or use excessive force. Pat dry using gauze sponges, not tissue or cotton balls. Peri-Wound Care Chad [name], Chad [name] ([id number]) Topical Primary Dressing Prisma 4.34 (in) Discharge Instruction: Moisten w/normal saline or sterile water; Cover wound as directed. Do not remove from wound bed. Secondary Dressing Coverlet Latex-Free Fabric Adhesive Dressings Discharge Instruction: 1.5 x 2 Secured With Compression Wrap Compression Stockings Add-Ons Electronic Signature(s) Signed: 10/10/2020 6:32:03 PM By: Gretta Cool, BSN, RN, CWS, Kim RN, BSN Entered By: Gretta Cool, BSN, RN, CWS, Kim on 10/09/2020 09:12:38 Chad [name] ([id number]) -------------------------------------------------------------------------------- Wound Assessment Details Patient Name: Chad [name] Date of Service: 10/09/2020 9:00 AM Medical Record Number: [id number] Patient Account Number: [id number] Date of Birth/Sex: [birth year] (60 y.o. M) Treating RN: Cornell Barman Primary Care Annelyse Rey: Clayborn Bigness Other Clinician: Referring Bakary Bramer: Clayborn Bigness Treating Wei Poplaski/Extender: Jeri Cos Weeks in Treatment: 8 Wound Status Wound Number: 8 Primary Venous Leg Ulcer Etiology: Wound Location: Left, Medial Ankle Wound Open Wounding Event: Gradually Appeared Status: Date Acquired: 07/12/2019 Comorbid Anemia, Chronic Obstructive Pulmonary Disease (COPD), Weeks Of Treatment: 8 History: Hepatitis C, Received Chemotherapy Clustered Wound: No Photos Wound Measurements Length: (cm) 5 Width: (cm) 2.8 Depth: (cm) 0.1 Area: (cm) 10.996 Volume: (cm) 1.1 % Reduction in Area: -86.7% % Reduction in Volume: -86.8% Epithelialization: Small (1-33%) Tunneling: No Undermining: No Wound Description Classification: Full Thickness Without Exposed Support Structures Wound Margin: Flat and  Intact Exudate Amount: Large Exudate Type: Serous Exudate Color: amber Foul Odor After Cleansing: No Slough/Fibrino Yes Wound Bed Granulation Amount: Medium (34-66%) Exposed Structure Granulation Quality: Red, Hyper-granulation Fascia Exposed: No Necrotic Amount: Medium (34-66%) Fat Layer (Subcutaneous Tissue) Exposed: Yes Necrotic Quality: Adherent Slough Tendon Exposed: No Muscle Exposed: No Joint Exposed: No Bone Exposed: No Treatment Notes Wound #8 (Ankle) Wound Laterality: Left, Medial Cleanser Normal Saline Discharge Instruction: Wash your hands with soap and water. Remove old dressing, discard into plastic bag and place into trash. Cleanse the wound with Normal Saline prior to applying a clean dressing using gauze  sponges, not tissues or cotton balls. Do not scrub or use excessive force. Pat dry using gauze sponges, not tissue or cotton balls. Chad [name], Chad [name] ([id number]) Peri-Wound Care Desitin Maximum Strength Ointment 4 (oz) Discharge Instruction: Zinc periwound Topical Primary Dressing Silvercel Small 2x2 (in/in) Discharge Instruction: Apply Silvercel Small 2x2 (in/in) as instructed Secondary Dressing ABD Pad 5x9 (in/in) Discharge Instruction: Cover with ABD pad Kerlix 4.5 x 4.1 (in/yd) Discharge Instruction: Apply Kerlix 4.5 x 4.1 (in/yd) as instructed Secured With 65M [place] Surgical Tape, 2x2 (in/yd) Compression Wrap Compression Stockings Environmental education officer) Signed: 10/10/2020 6:32:03 PM By: Gretta Cool, BSN, RN, CWS, Kim RN, BSN Entered By: Gretta Cool, BSN, RN, CWS, Kim on 10/09/2020 09:13:34 Chad [name] ([id number]) -------------------------------------------------------------------------------- [place] Details Patient Name: Chad [name] Date of Service: 10/09/2020 9:00 AM Medical Record Number: [id number] Patient Account Number: [id number] Date of Birth/Sex: Dec 17, 1960 (60 y.o. M) Treating RN: Cornell Barman Primary Care Jayce Boyko:  Clayborn Bigness Other Clinician: Referring Sewell Pitner: Clayborn Bigness Treating Aeisha Minarik/Extender: Skipper Cliche in Treatment: 8 Vital Signs Time Taken: 08:56 Temperature (F): 98.1 Height (in): 67 Pulse (bpm): 84 Weight (lbs): 160 Respiratory Rate (breaths/min): 18 Body Mass Index (BMI): 25.1 Blood Pressure (mmHg): 96/64 Reference Range: 80 - 120 mg / dl Electronic Signature(s) Signed: 10/10/2020 6:32:03 PM By: Gretta Cool, BSN, RN, CWS, Kim RN, BSN Entered By: Gretta Cool, BSN, RN, CWS, Kim on 10/09/2020 08:56:58

## 2020-10-16 ENCOUNTER — Encounter: Payer: Medicaid Other | Admitting: Physician Assistant

## 2020-10-19 ENCOUNTER — Encounter: Payer: Medicaid Other | Admitting: Physician Assistant

## 2020-10-19 ENCOUNTER — Non-Acute Institutional Stay: Payer: Medicaid Other | Admitting: Nurse Practitioner

## 2020-10-19 ENCOUNTER — Other Ambulatory Visit: Payer: Self-pay

## 2020-10-19 ENCOUNTER — Encounter: Payer: Self-pay | Admitting: Nurse Practitioner

## 2020-10-19 VITALS — BP 98/68 | HR 78 | Resp 18 | Wt 159.6 lb

## 2020-10-19 DIAGNOSIS — Z515 Encounter for palliative care: Secondary | ICD-10-CM

## 2020-10-19 DIAGNOSIS — C661 Malignant neoplasm of right ureter: Secondary | ICD-10-CM

## 2020-10-19 DIAGNOSIS — R630 Anorexia: Secondary | ICD-10-CM

## 2020-10-19 DIAGNOSIS — I872 Venous insufficiency (chronic) (peripheral): Secondary | ICD-10-CM | POA: Diagnosis not present

## 2020-10-19 NOTE — Progress Notes (Addendum)
Chad [name] ([id number]) Visit Report for 10/19/2020 Arrival Information Details Patient Name: Chad [name] Date of Service: 10/19/2020 12:45 PM Medical Record Number: [id number] Patient Account Number: [id number] Date of Birth/Sex: [birth year] (60 y.o. M) Treating RN: Chad [name] Primary Care Chad [name]: Chad [name] Other Clinician: Jeanine [name] Referring Chad [name]: Chad [name] Treating Chad [name]/Extender: Chad [name] in Treatment: 9 Visit Information History Since Last Visit Added or deleted any medications: No Patient Arrived: Wheel Chair Had a fall or experienced change in No Arrival Time: 12:56 activities of daily living that may affect Accompanied By: self risk of falls: Transfer Assistance: None Hospitalized since last visit: No Patient Identification Verified: Yes Pain Present Now: No Secondary Verification Process Completed: Yes Patient Requires Transmission-Based No Precautions: Patient Has Alerts: Yes Patient Alerts: Patient on Blood Thinner ABI right 1.16 ABI left 1.14 Electronic Signature(s) Signed: 10/19/2020 2:45:39 PM By: Chad [name] Entered By: Chad [name] on 10/19/2020 12:58:39 Chad [name] ([id number]) -------------------------------------------------------------------------------- Clinic Level of Care Assessment Details Patient Name: Chad [name] Date of Service: 10/19/2020 12:45 PM Medical Record Number: [id number] Patient Account Number: [id number] Date of Birth/Sex: 08-Jul-1961 (60 y.o. M) Treating RN: Chad [name] Primary Care Chad [name]: Chad [name] Other Clinician: Jeanine [name] Referring Chad [name]: Chad [name] Treating Chad [name] in Treatment: 9 Clinic Level of Care Assessment Items TOOL 4 Quantity Score X - Use when only an EandM is performed on FOLLOW-UP visit 1 0 ASSESSMENTS - Nursing Assessment / Reassessment X - Reassessment of Co-morbidities (includes updates in patient status) 1 10 X- 1  5 Reassessment of Adherence to Treatment Plan ASSESSMENTS - Wound and Skin Assessment / Reassessment []  - Simple Wound Assessment / Reassessment - one wound 0 X- 2 5 Complex Wound Assessment / Reassessment - multiple wounds []  - 0 Dermatologic / Skin Assessment (not related to wound area) ASSESSMENTS - Focused Assessment []  - Circumferential Edema Measurements - multi extremities 0 []  - 0 Nutritional Assessment / Counseling / Intervention []  - 0 Lower Extremity Assessment (monofilament, tuning fork, pulses) []  - 0 Peripheral Arterial Disease Assessment (using hand held doppler) ASSESSMENTS - Ostomy and/or Continence Assessment and Care []  - Incontinence Assessment and Management 0 []  - 0 Ostomy Care Assessment and Management (repouching, etc.) PROCESS - Coordination of Care X - Simple Patient / Family Education for ongoing care 1 15 []  - 0 Complex (extensive) Patient / Family Education for ongoing care []  - 0 Staff obtains Programmer, systems, Records, Test Results / Process Orders []  - 0 Staff telephones HHA, Nursing Homes / Clarify orders / etc []  - 0 Routine Transfer to another Facility (non-emergent condition) []  - 0 Routine Hospital Admission (non-emergent condition) []  - 0 New Admissions / Biomedical engineer / Ordering NPWT, Apligraf, etc. []  - 0 Emergency Hospital Admission (emergent condition) X- 1 10 Simple Discharge Coordination []  - 0 Complex (extensive) Discharge Coordination PROCESS - Special Needs []  - Pediatric / Minor Patient Management 0 []  - 0 Isolation Patient Management []  - 0 Hearing / Language / Visual special needs []  - 0 Assessment of Community assistance (transportation, D/C planning, etc.) []  - 0 Additional assistance / Altered mentation []  - 0 Support Surface(s) Assessment (bed, cushion, seat, etc.) INTERVENTIONS - Wound Cleansing / Measurement Chad [name] ([id number]) []  - 0 Simple Wound Cleansing - one wound X- 2 5 Complex Wound  Cleansing - multiple wounds X- 1 5 Wound Imaging (photographs - any number of wounds) []  - 0 Wound Tracing (instead of photographs) []  - 0 Simple Wound Measurement - one wound X-  2 5 Complex Wound Measurement - multiple wounds INTERVENTIONS - Wound Dressings X - Small Wound Dressing one or multiple wounds 1 10 X- 1 15 Medium Wound Dressing one or multiple wounds []  - 0 Large Wound Dressing one or multiple wounds X- 1 5 Application of Medications - topical []  - 0 Application of Medications - injection INTERVENTIONS - Miscellaneous []  - External ear exam 0 []  - 0 Specimen Collection (cultures, biopsies, blood, body fluids, etc.) []  - 0 Specimen(s) / Culture(s) sent or taken to Lab for analysis []  - 0 Patient Transfer (multiple staff / Civil Service fast streamer / Similar devices) []  - 0 Simple Staple / Suture removal (25 or less) []  - 0 Complex Staple / Suture removal (26 or more) []  - 0 Hypo / Hyperglycemic Management (close monitor of Blood Glucose) []  - 0 Ankle / Brachial Index (ABI) - do not check if billed separately X- 1 5 Vital Signs Has the patient been seen at the hospital within the last three years: Yes Total Score: 110 Level Of Care: New/Established - Level 3 Electronic Signature(s) Signed: 10/22/2020 5:08:01 PM By: Chad Coria RN Entered By: Chad [name] on 10/19/2020 13:21:30 Chad [name] ([id number]) -------------------------------------------------------------------------------- Lower Extremity Assessment Details Patient Name: Chad [name] Date of Service: 10/19/2020 12:45 PM Medical Record Number: [id number] Patient Account Number: [id number] Date of Birth/Sex: 1960/09/30 (60 y.o. M) Treating RN: Chad [name] Primary Care Chad [name]: Chad [name] Other Clinician: Jeanine [name] Referring Chad [name]: Chad [name] Treating Chad [name]/Extender: Jeri Cos Weeks in Treatment: 9 Electronic Signature(s) Signed: 10/19/2020 2:45:39 PM By: Chad [name] Signed:  10/22/2020 5:08:01 PM By: Chad Coria RN Entered By: Chad [name] on 10/19/2020 13:13:45 Chad [name] ([id number]) -------------------------------------------------------------------------------- Multi Wound Chart Details Patient Name: Chad [name] Date of Service: 10/19/2020 12:45 PM Medical Record Number: [id number] Patient Account Number: [id number] Date of Birth/Sex: June 24, 1961 (60 y.o. M) Treating RN: Chad [name] Primary Care Leo Weyandt: Chad [name] Other Clinician: Jeanine [name] Referring Elle Vezina: Chad [name] Treating Junior Huezo/Extender: Chad [name] in Treatment: 9 Vital Signs Height(in): 73 Pulse(bpm): 5 Weight(lbs): 160 Blood Pressure(mmHg): 115/66 Body Mass Index(BMI): 25 Temperature(F): 98.2 Respiratory Rate(breaths/min): 18 Photos: Wound Location: Right Toe Second Right Calcaneus Left, Medial Ankle Wounding Event: Gradually Appeared Gradually Appeared Gradually Appeared Primary Etiology: Abscess Abscess Venous Leg Ulcer Comorbid History: Anemia, Chronic Obstructive Anemia, Chronic Obstructive Anemia, Chronic Obstructive Pulmonary Disease (COPD), Pulmonary Disease (COPD), Pulmonary Disease (COPD), Hepatitis C, Received Hepatitis C, Received Hepatitis C, Received Chemotherapy Chemotherapy Chemotherapy Date Acquired: 03/28/2020 08/29/2019 07/12/2019 Weeks of Treatment: 9 9 9  Wound Status: Open Open Open Measurements L x W x D (cm) 1.3x0.3x0.1 0.1x0.1x0.1 5x2.8x0.1 Area (cm) : 0.306 0.008 10.996 Volume (cm) : 0.031 0.001 1.1 % Reduction in Area: 74.50% 98.10% -86.70% % Reduction in Volume: 74.20% 97.70% -86.80% Classification: Full Thickness Without Exposed Full Thickness Without Exposed Full Thickness Without Exposed Support Structures Support Structures Support Structures Exudate Amount: Small None Present Large Exudate Type: Serous N/A Serous Exudate Color: amber N/A amber Wound Margin: N/A N/A Flat and Intact Granulation Amount: Small (1-33%) None  Present (0%) Medium (34-66%) Granulation Quality: Red N/A Red, Pink Necrotic Amount: Medium (34-66%) Large (67-100%) Medium (34-66%) Necrotic Tissue: Adherent [place] Exposed Structures: Fat Layer (Subcutaneous Tissue): Fat Layer (Subcutaneous Tissue): Fat Layer (Subcutaneous Tissue): Yes Yes Yes Fascia: No Fascia: No Fascia: No Tendon: No Tendon: No Tendon: No Muscle: No Muscle: No Muscle: No Joint: No Joint: No Joint: No Bone: No Bone: No Bone: No Epithelialization: None None Small (1-33%) Treatment Notes Electronic Signature(s)  Signed: 10/22/2020 5:08:01 PM By: Chad Coria RN Entered By: Chad [name] on 10/19/2020 13:18:17 Chad [name] ([id number]) Chad [name] ([id number]) -------------------------------------------------------------------------------- Multi-Disciplinary Care Plan Details Patient Name: Chad [name] Date of Service: 10/19/2020 12:45 PM Medical Record Number: [id number] Patient Account Number: [id number] Date of Birth/Sex: Jun 11, 1961 (60 y.o. M) Treating RN: Chad [name] Primary Care Dejuana Weist: Chad [name] Other Clinician: Jeanine [name] Referring Tannisha Kennington: Chad [name] Treating Rasaan Brotherton/Extender: Jeri Cos Weeks in Treatment: 9 Active Inactive Electronic Signature(s) Signed: 10/22/2020 5:08:01 PM By: Chad Coria RN Entered By: Chad [name] on 10/19/2020 13:17:28 Chad [name] ([id number]) -------------------------------------------------------------------------------- Pain Assessment Details Patient Name: Chad [name] Date of Service: 10/19/2020 12:45 PM Medical Record Number: [id number] Patient Account Number: [id number] Date of Birth/Sex: [birth year] (60 y.o. M) Treating RN: Chad [name] Primary Care Dyrell Tuccillo: Chad [name] Other Clinician: Jeanine [name] Referring Austyn Seier: Chad [name] Treating Ebelyn Bohnet/Extender: Chad [name] in Treatment: 9 Active Problems Location of Pain Severity and Description of  Pain Patient Has Paino No Site Locations Pain Management and Medication Current Pain Management: Electronic Signature(s) Signed: 10/19/2020 2:45:39 PM By: Chad [name] Signed: 10/22/2020 5:08:01 PM By: Chad Coria RN Entered By: Chad [name] on 10/19/2020 13:01:16 Chad [name] ([id number]) -------------------------------------------------------------------------------- Patient/Caregiver Education Details Patient Name: Chad [name] Date of Service: 10/19/2020 12:45 PM Medical Record Number: [id number] Patient Account Number: [id number] Date of Birth/Gender: [birth year] (60 y.o. M) Treating RN: Chad [name] Primary Care Physician: Chad [name] Other Clinician: Jeanine [name] Referring Physician: Clayborn [name] Treating Physician/Extender: Chad [name] in Treatment: 9 Education Assessment Education Provided To: Patient Education Topics Provided Wound/Skin Impairment: Methods: Explain/Verbal Responses: State content correctly [employer]) Signed: 10/22/2020 5:08:01 PM By: Chad Coria RN Entered By: Chad [name] on 10/19/2020 13:21:47 Chad [name] ([id number]) -------------------------------------------------------------------------------- Wound Assessment Details Patient Name: Chad [name] Date of Service: 10/19/2020 12:45 PM Medical Record Number: [id number] Patient Account Number: [id number] Date of Birth/Sex: [birth year] (60 y.o. M) Treating RN: Chad [name] Primary Care Godric Lavell: Chad [name] Other Clinician: Jeanine [name] Referring Tavon Magnussen: Chad [name] Treating Mirella Gueye/Extender: Jeri Cos Weeks in Treatment: 9 Wound Status Wound Number: 6 Primary Abscess Etiology: Wound Location: Right Toe Second Wound Open Wounding Event: Gradually Appeared Status: Date Acquired: 03/28/2020 Comorbid Anemia, Chronic Obstructive Pulmonary Disease (COPD), Weeks Of Treatment: 9 History: Hepatitis C, Received Chemotherapy Clustered Wound: No Photos Wound  Measurements Length: (cm) 1.3 Width: (cm) 0.3 Depth: (cm) 0.1 Area: (cm) 0.306 Volume: (cm) 0.031 % Reduction in Area: 74.5% % Reduction in Volume: 74.2% Epithelialization: None Tunneling: No Undermining: No Wound Description Classification: Full Thickness Without Exposed Support Structu Exudate Amount: Small Exudate Type: Serous Exudate Color: amber res Foul Odor After Cleansing: No Slough/Fibrino Yes Wound Bed Granulation Amount: Small (1-33%) Exposed Structure Granulation Quality: Red Fascia Exposed: No Necrotic Amount: Medium (34-66%) Fat Layer (Subcutaneous Tissue) Exposed: Yes Necrotic Quality: Adherent Slough Tendon Exposed: No Muscle Exposed: No Joint Exposed: No Bone Exposed: No Electronic Signature(s) Signed: 10/19/2020 2:45:39 PM By: Chad [name] Signed: 10/22/2020 5:08:01 PM By: Chad Coria RN Entered By: Chad [name] on 10/19/2020 13:12:13 Chad [name] ([id number]) -------------------------------------------------------------------------------- Wound Assessment Details Patient Name: Chad [name] Date of Service: 10/19/2020 12:45 PM Medical Record Number: [id number] Patient Account Number: [id number] Date of Birth/Sex: 1961/06/28 (60 y.o. M) Treating RN: Chad [name] Primary Care Keidra Withers: Chad [name] Other Clinician: Jeanine [name] Referring Nation Cradle: Chad [name] Treating Fransisco Messmer/Extender: Jeri Cos Weeks in Treatment: 9 Wound Status Wound Number: 7 Primary Abscess Etiology: Wound Location: Right Calcaneus Wound Healed - Epithelialized Wounding Event: Gradually Appeared Status: Date Acquired: 08/29/2019 Comorbid Anemia, Chronic Obstructive  Pulmonary Disease (COPD), Weeks Of Treatment: 9 History: Hepatitis C, Received Chemotherapy Clustered Wound: No Photos Wound Measurements Length: (cm) 0 Width: (cm) 0 Depth: (cm) 0 Area: (cm) 0 Volume: (cm) 0 % Reduction in Area: 100% % Reduction in Volume: 100% Epithelialization: Large  (67-100%) Tunneling: No Undermining: No Wound Description Classification: Full Thickness Without Exposed Support Structure Exudate Amount: None Present s Foul Odor After Cleansing: No Slough/Fibrino No Wound Bed Granulation Amount: None Present (0%) Exposed Structure Necrotic Amount: None Present (0%) Fascia Exposed: No Fat Layer (Subcutaneous Tissue) Exposed: No Tendon Exposed: No Muscle Exposed: No Joint Exposed: No Bone Exposed: No Electronic Signature(s) Signed: 10/22/2020 5:08:01 PM By: Chad Coria RN Entered By: Chad [name] on 10/19/2020 13:19:04 Chad [name] ([id number]) -------------------------------------------------------------------------------- Wound Assessment Details Patient Name: Chad [name] Date of Service: 10/19/2020 12:45 PM Medical Record Number: [id number] Patient Account Number: [id number] Date of Birth/Sex: Aug 23, 1960 (60 y.o. M) Treating RN: Chad [name] Primary Care Autrey Human: Chad [name] Other Clinician: Jeanine [name] Referring Laresa Oshiro: Chad [name] Treating Baldomero Mirarchi/Extender: Jeri Cos Weeks in Treatment: 9 Wound Status Wound Number: 8 Primary Venous Leg Ulcer Etiology: Wound Location: Left, Medial Ankle Wound Open Wounding Event: Gradually Appeared Status: Date Acquired: 07/12/2019 Comorbid Anemia, Chronic Obstructive Pulmonary Disease (COPD), Weeks Of Treatment: 9 History: Hepatitis C, Received Chemotherapy Clustered Wound: No Photos Wound Measurements Length: (cm) 5 Width: (cm) 2.8 Depth: (cm) 0.1 Area: (cm) 10.996 Volume: (cm) 1.1 % Reduction in Area: -86.7% % Reduction in Volume: -86.8% Epithelialization: Small (1-33%) Tunneling: No Undermining: No Wound Description Classification: Full Thickness Without Exposed Support Structu Wound Margin: Flat and Intact Exudate Amount: Large Exudate Type: Serous Exudate Color: amber res Foul Odor After Cleansing: No Slough/Fibrino Yes Wound Bed Granulation Amount: Medium  (34-66%) Exposed Structure Granulation Quality: Red, Pink Fascia Exposed: No Necrotic Amount: Medium (34-66%) Fat Layer (Subcutaneous Tissue) Exposed: Yes Necrotic Quality: Adherent Slough Tendon Exposed: No Muscle Exposed: No Joint Exposed: No Bone Exposed: No Electronic Signature(s) Signed: 10/19/2020 2:45:39 PM By: Chad [name] Signed: 10/22/2020 5:08:01 PM By: Chad Coria RN Entered By: Chad [name] on 10/19/2020 13:13:21 Chad [name] ([id number]) -------------------------------------------------------------------------------- Vitals Details Patient Name: Chad [name] Date of Service: 10/19/2020 12:45 PM Medical Record Number: [id number] Patient Account Number: [id number] Date of Birth/Sex: 14-Mar-1961 (60 y.o. M) Treating RN: Chad [name] Primary Care Donnamae Muil[place]: Chad [name] Other Clinician: Jeanine [name] Referring Zanovia Rotz: Chad [name] Treating Kashonda Sarkisyan/Extender: Chad [name] in Treatment: 9 Vital Signs Time Taken: 11:58 Temperature (F): 98.2 Height (in): 67 Pulse (bpm): 66 Weight (lbs): 160 Respiratory Rate (breaths/min): 18 Body Mass Index (BMI): 25.1 Blood Pressure (mmHg): 115/66 Reference Range: 80 - 120 mg / dl Electronic Signature(s) Signed: 10/19/2020 2:45:39 PM By: Chad [name] Entered By: Chad [name] on 10/19/2020 13:01:10

## 2020-10-19 NOTE — Progress Notes (Addendum)
Chad [name], Chad [name] ([id number]) Visit Report for 10/19/2020 Chief Complaint Document Details Patient Name: Chad [name], Chad [name] Date of Service: 10/19/2020 12:45 PM Medical Record Number: [id number] Patient Account Number: [id number] Date of Birth/Sex: Sep 19, 1960 (61 y.o. M) Treating RN: Carlene Coria Primary Care Provider: Clayborn Bigness Other Clinician: Jeanine Luz Referring Provider: Clayborn Bigness Treating Provider/Extender: Skipper Cliche in Treatment: 9 Information Obtained from: Patient Chief Complaint Left ankle and right foot and heel ulcers Electronic Signature(s) Signed: 10/19/2020 12:48:28 PM By: Worthy Keeler PA-C Entered By: Worthy Keeler on 10/19/2020 12:48:28 Chad [name] ([id number]) -------------------------------------------------------------------------------- HPI Details Patient Name: Chad [name] Date of Service: 10/19/2020 12:45 PM Medical Record Number: [id number] Patient Account Number: [id number] Date of Birth/Sex: 1961-06-06 (60 y.o. M) Treating RN: Carlene Coria Primary Care Provider: Clayborn Bigness Other Clinician: Jeanine Luz Referring Provider: Clayborn Bigness Treating Provider/Extender: Skipper Cliche in Treatment: 9 History of Present Illness HPI Description: 10/08/18 on evaluation today patient actually presents to our office for initial evaluation concerning wounds that he has of the bilateral lower extremities. 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 He also has bilateral venous stasis. He apparently has been homeless currently following discharge from the hospital apparently he has been placed at [facility] which is is a skilled nursing facility locally. Nonetheless fortunately he does not show any signs of infection at this time which is good news. In fact several of the wound  actually appears to be showing some signs of improvement already in my pinion. There are a couple areas in the left leg in particular there likely gonna require some sharp debridement to help clear away some necrotic tissue and help with more sufficient healing. No fevers, chills, nausea, or vomiting noted at this time. 10/15/18 on evaluation today patient actually appears to be doing very well in regard to his bilateral lower extremities. He's been tolerating the dressing changes without complication. Fortunately there does not appear to be any evidence of active infection at this time which is great news. Overall I'm actually very pleased with how this has progressed in just one visits time. Readmission: 08/14/2020 upon evaluation today patient presents for re-evaluation here in our clinic. He is having issues with his left ankle region as well as his right toe and his right heel. He tells me that the toe and heel actually began as a area that was itching that he was scratching and then subsequently opened up into wounds. These may have been abscess areas I presume based on what I am seeing currently. With regard to his left ankle region he tells me this was a similar type occurrence although he does have venous stasis this very well may be more of a venous leg ulcer more than anything. Nonetheless I do believe that the patient would benefit from appropriate and aggressive wound care to try to help get things under better control here. He does have history of a stroke on the left side affecting him to some degree there that he is able to stand although he does have some residual weakness. Otherwise again the patient does have chronic venous insufficiency as previously noted. His arterial studies most recently obtained showed that he had an ABI on the right of 1.16 with a TBI of 0.52 and on the left and ABI of 1.14 with a TBI of 0.81. That was obtained on 06/19/2020.  08/28/2020 upon evaluation today  patient appears to be doing decently well in regard to his wounds in general. He has been tolerating the dressing changes without complication. Fortunately there does not appear to be any signs of active infection which is great news. With that being said I think the [facility] is doing a good job I would recommend that we likely continue with that currently. 09/11/2020 upon evaluation today patient's wounds did not appear to be doing too poorly but again he is not really showing signs of significant improvement with regard to any of the wounds on the right. None of them have Hydrofera Blue on them I am not exactly sure why this is not being followed as the facility did not contact us to let us know of any issues with obtaining dressings or otherwise. With that being said he is supposed to be using Hydrofera Blue on both of the wounds on the right foot as well as the ankle wound on the left side. 09/18/2020 upon evaluation today patient appears to be doing poorly with regard to his wounds. Again right now the left ankle in particular showed signs of extreme maceration. Apparently he was told by someone with staff at [place] they could not get the [facility]. With that being said this is something that is never been relayed to [place] one way or another. Also the patient subsequently has not supposed to have a border gauze dressing on. He should have an ABD pad and roll gauze to secure as this drains much too much just to have a border gauze dressing to cover. Nonetheless the fact that they are not using the appropriate dressing is directly causing deterioration of the left ankle wound it is significantly worse today compared to what it was previous. I did attempt to call [facility] while the patient was here I called three times and got no one to even pick up the phone. After this I had my for an office coordinator call and she was able to finally get through and leave a message  with the D ON as of dictation of this note which is roughly about an hour and a half later I still have not been able to speak with anyone at the facility. 09/25/2020 upon evaluation today patient actually showing signs of good improvement which is excellent news. He has been tolerating the dressing changes without complication. Fortunately there is no signs of active infection which is great news. No fevers, chills, nausea, vomiting, or diarrhea. I do feel like the facility has been doing a much better job at taking care of him as far as the dressings are concerned. However the director of nursing never did call me back. 10/09/2020 upon evaluation today patient appears to be doing well with regard to his wound. The toe ulcer did require some debridement but the other 2 areas actually appear to be doing quite well. 10/19/2020 upon evaluation today patient actually appears to be doing very well in regard to his wounds. In fact the heel does appear to be completely healed. The toe is doing better in the medial ankle on the left is also doing better. Overall I think he is headed in the right direction. Electronic Signature(s) Signed: 10/19/2020 1:20:46 PM By: Worthy Keeler PA-C Entered By: Worthy Keeler on 10/19/2020 13:20:46 Chad [name] ([id number]) -------------------------------------------------------------------------------- Physical Exam Details Patient Name: Chad [name] Date of Service: 10/19/2020 12:45 PM Medical Record Number: [id number] Patient Account Number: [id number] Date of  Birth/Sex: Nov 18, 1960 (60 y.o. M) Treating RN: Carlene Coria Primary Care Provider: Clayborn Bigness Other Clinician: Jeanine Luz Referring Provider: Clayborn Bigness Treating Provider/Extender: Jeri Cos Weeks in Treatment: 9 Constitutional Well-nourished and well-hydrated in no acute distress. Respiratory normal breathing without difficulty. Psychiatric this patient is able to make decisions and  demonstrates good insight into disease process. Alert and Oriented x 3. pleasant and cooperative. Notes Patient's wound bed showed signs of good granulation epithelization at this point. There does not appear to be any evidence of active infection which is great news and overall I am very pleased with where things stand. No fevers, chills, nausea, vomiting, or diarrhea. Electronic Signature(s) Signed: 10/19/2020 1:21:01 PM By: Worthy Keeler PA-C Entered By: Worthy Keeler on 10/19/2020 13:21:01 Chad [name] ([id number]) -------------------------------------------------------------------------------- Physician Orders Details Patient Name: Chad [name] Date of Service: 10/19/2020 12:45 PM Medical Record Number: [id number] Patient Account Number: [id number] Date of Birth/Sex: [birth year] (60 y.o. 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 Wound Treatment Wound #6 - Toe Second Wound Laterality: Right Cleanser: Normal Saline 1 x Per Day/30 Days Discharge Instructions: Wash your hands with soap and water. Remove old dressing, discard into plastic bag and place into trash. Cleanse the wound with Normal Saline prior to applying a clean dressing using gauze sponges, not tissues or cotton balls. Do not scrub or use excessive  force. Pat dry using gauze sponges, not tissue or cotton balls. Primary Dressing: Silvercel Small 2x2 (in/in) (Generic) 1 x Per Day/30 Days Discharge Instructions: Apply Silvercel Small 2x2 (in/in) as instructed Secondary Dressing: Coverlet Latex-Free Fabric Adhesive Dressings 1 x Per Day/30 Days Discharge Instructions: Secure just the wounded toe Wound #8 - Ankle Wound Laterality: Left, Medial Cleanser: Normal Saline 1 x Per Day/30 Days Discharge Instructions: Wash your hands with soap and water. Remove old dressing, discard into plastic bag and place into trash. Cleanse the wound with Normal Saline prior to applying a clean dressing using gauze sponges, not tissues or cotton balls. Do not scrub or use excessive force. Pat dry using gauze sponges, not tissue or cotton balls. Peri-Wound Care: Desitin Maximum Strength Ointment 4 (oz) 1 x Per Day/30 Days Discharge Instructions: Zinc periwound Primary Dressing: Silvercel Small 2x2 (in/in) 1 x Per Day/30 Days Discharge Instructions: Apply Silvercel Small 2x2 (in/in) as instructed Secondary Dressing: ABD Pad 5x9 (in/in) 1 x Per Day/30 Days Discharge Instructions: Cover with ABD pad Secondary Dressing: Kerlix 4.5 x 4.1 (in/yd) 1 x Per Day/30 Days Discharge Instructions: Apply Kerlix 4.5 x 4.1 (in/yd) as instructed Secured With: 73M Medipore H Soft Cloth Surgical Tape, 2x2 (in/yd) 1 x Per Day/30 Days Notes protective dressing to right heel times 1 week Electronic Signature(s) Signed: 10/19/2020 2:05:44 PM By: Worthy Keeler PA-C Signed: 10/22/2020 5:08:01 PM By: Carlene Coria RN Chad [name] ([id number]) Entered By: Carlene Coria on 10/19/2020 13:20:19 Chad [name] ([id number]) -------------------------------------------------------------------------------- Problem List Details Patient Name: Chad [name] Date of Service: 10/19/2020 12:45 PM Medical Record Number:  [id number] Patient Account Number: [id number] Date of Birth/Sex: 1961-03-10  (60 y.o. M) Treating RN: Carlene Coria Primary Care Provider: Clayborn Bigness Other Clinician: Jeanine Luz Referring Provider: Clayborn Bigness Treating Provider/Extender: Skipper Cliche in Treatment: 9 Active Problems ICD-10 Encounter Code Description Active Date MDM Diagnosis I87.2 Venous insufficiency (chronic) (peripheral) 08/14/2020 No Yes L97.322 Non-pressure chronic ulcer of left ankle with fat layer exposed 08/14/2020 No Yes L97.412 Non-pressure chronic ulcer of right heel and midfoot with fat layer 08/14/2020 No Yes exposed L97.512 Non-pressure chronic ulcer of other part of right foot with fat layer 08/14/2020 No Yes exposed I69.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 M) Treating RN: Carlene Coria Primary Care Provider: Clayborn Bigness Other Clinician: Jeanine Luz Referring Provider: Clayborn Bigness Treating Provider/Extender: Skipper Cliche in Treatment: 9 Subjective Chief Complaint Information obtained from Patient Left ankle and right foot and heel ulcers History of Present Illness (HPI) 10/08/18 on evaluation today patient actually presents to our office for initial evaluation concerning wounds that he has of the bilateral lower extremities. 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  He also has bilateral venous stasis. He apparently has been homeless currently following discharge from the hospital apparently he has been placed at [facility] which is is a skilled nursing facility locally. Nonetheless fortunately he does not show any signs of infection at this time which is good news. In fact several of the wound actually appears to be showing some signs of improvement already in my pinion. There are a couple areas in the left leg in particular there likely gonna require some sharp debridement to help clear away some necrotic tissue and help with more sufficient healing. No fevers, chills, nausea, or vomiting noted at this time. 10/15/18 on evaluation today patient actually appears to be doing very well in regard to his bilateral lower extremities. He's been tolerating the dressing changes without complication. Fortunately there does not appear to be any evidence of active infection at this time which is great news. Overall I'm actually very pleased with how this has progressed in just one visits time. Readmission: 08/14/2020 upon evaluation today patient presents for re-evaluation here in our clinic. He is having issues with his left ankle region as well as his right toe and his right heel. He tells me that the toe and heel actually began as a area that was itching that he was scratching and then subsequently opened up into wounds. These may have been abscess areas I presume based on what I am seeing currently. With regard to his left ankle region he tells me this was a similar type occurrence although he does have venous stasis this very well may be more of a venous leg ulcer more than anything. Nonetheless I do believe that the patient would benefit from appropriate and aggressive wound care to try to help get things under better control here. He does have history of a stroke on the left side affecting him to some degree there that he is able  to stand although he does  have some residual weakness. Otherwise again the patient does have chronic venous insufficiency as previously noted. His arterial studies most recently obtained showed that he had an ABI on the right of 1.16 with a TBI of 0.52 and on the left and ABI of 1.14 with a TBI of 0.81. That was obtained on 06/19/2020. 08/28/2020 upon evaluation today patient appears to be doing decently well in regard to his wounds in general. He has been tolerating the dressing changes without complication. Fortunately there does not appear to be any signs of active infection which is great news. With that being said I think the [facility] is doing a good job I would recommend that we likely continue with that currently. 09/11/2020 upon evaluation today patient's wounds did not appear to be doing too poorly but again he is not really showing signs of significant improvement with regard to any of the wounds on the right. None of them have Hydrofera Blue on them I am not exactly sure why this is not being followed as the facility did not contact us to let us know of any issues with obtaining dressings or otherwise. With that being said he is supposed to be using Hydrofera Blue on both of the wounds on the right foot as well as the ankle wound on the left side. 09/18/2020 upon evaluation today patient appears to be doing poorly with regard to his wounds. Again right now the left ankle in particular showed signs of extreme maceration. Apparently he was told by someone with staff at [place] they could not get the [facility]. With that being said this is something that is never been relayed to [place] one way or another. Also the patient subsequently has not supposed to have a border gauze dressing on. He should have an ABD pad and roll gauze to secure as this drains much too much just to have a border gauze dressing to cover. Nonetheless the fact that they are not using the appropriate dressing is directly causing  deterioration of the left ankle wound it is significantly worse today compared to what it was previous. I did attempt to call [facility] while the patient was here I called three times and got no one to even pick up the phone. After this I had my for an office coordinator call and she was able to finally get through and leave a message with the D ON as of dictation of this note which is roughly about an hour and a half later I still have not been able to speak with anyone at the facility. 09/25/2020 upon evaluation today patient actually showing signs of good improvement which is excellent news. He has been tolerating the dressing changes without complication. Fortunately there is no signs of active infection which is great news. No fevers, chills, nausea, vomiting, or diarrhea. I do feel like the facility has been doing a much better job at taking care of him as far as the dressings are concerned. However the director of nursing never did call me back. 10/09/2020 upon evaluation today patient appears to be doing well with regard to his wound. The toe ulcer did require some debridement but the other 2 areas actually appear to be doing quite well. 10/19/2020 upon evaluation today patient actually appears to be doing very well in regard to his wounds. In fact the heel does appear to be completely healed. The toe is doing better in the medial ankle on the  left is also doing better. Overall I think he is headed in the right direction. Chad [name], Chad [name] ([id number]) Objective Constitutional Well-nourished and well-hydrated in no acute distress. Vitals Time Taken: 11:58 AM, Height: 67 in, Weight: 160 lbs, BMI: 25.1, Temperature: 98.2 F, Pulse: 66 bpm, Respiratory Rate: 18 breaths/min, Blood Pressure: 115/66 mmHg. Respiratory normal breathing without difficulty. Psychiatric this patient is able to make decisions and demonstrates good insight into disease process. Alert and Oriented x 3. pleasant  and cooperative. General Notes: Patient's wound bed showed signs of good granulation epithelization at this point. There does not appear to be any evidence of active infection which is great news and overall I am very pleased with where things stand. No fevers, chills, nausea, vomiting, or diarrhea. Integumentary (Hair, Skin) Wound #6 status is Open. Original cause of wound was Gradually Appeared. The date acquired was: 03/28/2020. The wound has been in treatment 9 weeks. The wound is located on the Right Toe Second. The wound measures 1.3cm length x 0.3cm width x 0.1cm depth; 0.306cm^2 area and 0.031cm^3 volume. There is Fat Layer (Subcutaneous Tissue) exposed. There is no tunneling or undermining noted. There is a small amount of serous drainage noted. There is small (1-33%) red granulation within the wound bed. There is a medium (34-66%) amount of necrotic tissue within the wound bed including Adherent Slough. Wound #7 status is Healed - Epithelialized. Original cause of wound was Gradually Appeared. The date acquired was: 08/29/2019. The wound has been in treatment 9 weeks. The wound is located on the Right Calcaneus. The wound measures 0cm length x 0cm width x 0cm depth; 0cm^2 area and 0cm^3 volume. There is no tunneling or undermining noted. There is a none present amount of drainage noted. There is no granulation within the wound bed. There is no necrotic tissue within the wound bed. Wound #8 status is Open. Original cause of wound was Gradually Appeared. The date acquired was: 07/12/2019. The wound has been in treatment 9 weeks. The wound is located on the Left,Medial Ankle. The wound measures 5cm length x 2.8cm width x 0.1cm depth; 10.996cm^2 area and 1.1cm^3 volume. There is Fat Layer (Subcutaneous Tissue) exposed. There is no tunneling or undermining noted. There is a large amount of serous drainage noted. The wound margin is flat and intact. There is medium (34-66%) red, pink granulation  within the wound bed. There is a medium (34-66%) amount of necrotic tissue within the wound bed including Adherent Slough. 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 General Notes: protective dressing to right heel times 1 week WOUND #6: - Toe Second Wound Laterality: Right Cleanser: Normal Saline 1 x Per Day/30 Days Discharge Instructions: Wash your hands with soap and water. Remove old dressing, discard into plastic bag and place into trash. Cleanse the wound with Normal Saline prior to applying a clean dressing using gauze sponges, not tissues or cotton balls. Do not scrub or use excessive force. Pat dry using gauze sponges, not tissue or cotton balls. Primary Dressing: Silvercel Small 2x2 (in/in) (Generic) 1 x Per Day/30 Days Discharge Instructions: Apply Silvercel Small 2x2 (in/in) as instructed Secondary Dressing: Coverlet Latex-Free Fabric Adhesive Dressings 1 x Per Day/30 Days Discharge  Instructions: Secure just the wounded toe Chad [name], Chad [name] ([id number]) WOUND #8: - Ankle Wound Laterality: Left, Medial Cleanser: Normal Saline 1 x Per Day/30 Days Discharge Instructions: Wash your hands with soap and water. Remove old dressing, discard into plastic bag and place into trash. Cleanse the wound with Normal Saline prior to applying a clean dressing using gauze sponges, not tissues or cotton balls. Do not scrub or use excessive force. Pat dry using gauze sponges, not tissue or cotton balls. Peri-Wound Care: Desitin Maximum Strength Ointment 4 (oz) 1 x Per Day/30 Days Discharge  Instructions: Zinc periwound Primary Dressing: Silvercel Small 2x2 (in/in) 1 x Per Day/30 Days Discharge Instructions: Apply Silvercel Small 2x2 (in/in) as instructed Secondary Dressing: ABD Pad 5x9 (in/in) 1 x Per Day/30 Days Discharge Instructions: Cover with ABD pad Secondary Dressing: Kerlix 4.5 x 4.1 (in/yd) 1 x Per Day/30 Days Discharge Instructions: Apply Kerlix 4.5 x 4.1 (in/yd) as instructed Secured With: 66M Medipore H Soft Cloth Surgical Tape, 2x2 (in/yd) 1 x Per Day/30 Days 1. Would recommend that we going to continue with the wound care measures as before and the patient is in agreement with the plan and includes the use of the silver alginate dressing which we can use to both wound locations on the toe as well as on the medial ankle region. 2. I am going to recommend continued use of the Desitin as well. 3. I would also recommend the patient continue to be monitored for any signs of worsening infection obviously right now I see no infection we will keep a close eye on things however. We will see patient back for reevaluation in 1 week here in the clinic. If anything worsens or changes patient will contact our office for additional recommendations. Electronic Signature(s) Signed: 10/19/2020 1:21:42 PM By: Worthy Keeler PA-C Entered By: Worthy Keeler on 10/19/2020 13:21:42 Chad [name] ([id number]) -------------------------------------------------------------------------------- SuperBill Details Patient Name: Chad [name] Date of Service: 10/19/2020 Medical Record Number: [id number] Patient Account Number: [id number] Date of Birth/Sex: 02-Mar-1961 (60 y.o. M) Treating RN: Carlene Coria Primary Care Provider: Clayborn Bigness Other Clinician: Jeanine Luz Referring Provider: Clayborn Bigness Treating Provider/Extender: Skipper Cliche in Treatment: 9 Diagnosis Coding ICD-10 Codes Code Description I87.2 Venous insufficiency (chronic) (peripheral) L97.322 Non-pressure  chronic ulcer of left ankle with fat layer exposed L97.412 Non-pressure chronic ulcer of right heel and midfoot with fat layer exposed L97.512 Non-pressure chronic ulcer of other part of right foot with fat layer exposed 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 side Facility Procedures CPT4 Code: 52778242 Description: 99213 - WOUND CARE VISIT-LEV 3 EST PT Modifier: Quantity: 1 Physician Procedures CPT4 Code: 3536144 Description: 99213 - WC PHYS LEVEL 3 - EST PT Modifier: Quantity: 1 CPT4 Code: Description: ICD-10 Diagnosis Description I87.2 Venous insufficiency (chronic) (peripheral) L97.322 Non-pressure chronic ulcer of left ankle with fat layer exposed L97.412 Non-pressure chronic ulcer of right heel and midfoot with fat layer exp L97.512  Non-pressure chronic ulcer of other part of right foot with fat layer e Modifier: osed xposed Quantity: Electronic Signature(s) Signed: 10/19/2020 1:21:56 PM By: Worthy Keeler PA-C Entered By: Worthy Keeler on 10/19/2020 13:21:56

## 2020-10-19 NOTE — Progress Notes (Signed)
[facility] Consult Note Telephone: [phone]  Fax: [phone]  PATIENT NAME: Chad [name] DOB: 1960/08/14 MRN: [id number]   Black RESPONSIBLE PARTY:Self  RECOMMENDATIONS and PLAN: 1.ACP: DNR; treat what is treatable including hospitalizations if necessary  2.Anorexia secondary to protein calorie malnutritionrelated to cancerimprovingongoing.Continue to monitor daily weights, supplements, supportive measures and courage to eat  3.Chronic pain monitor on pain scale, monitor efficacy vs adverse side effects.Will continue to monitor; encouraged to ask when in pain, Continue to follow by psychotherapy and psychiatry for counseling; supportive services.  4.Palliative care encounter;Palliative medicine team will continue to support patient, patient's family, and medical team. Visit consisted of counseling and education dealing with the complex and emotionally intense issues of symptom management and palliative care in the setting of serious and potentially life-threatening illness  5. F/u 1 months for ongoing support, monitoring pain, weight, disease progression  I spent 40 minutes providing this consultation, starting at 2:00pm. More than 50% of the time in this consultation was spent coordinating communication.   HISTORY OF PRESENT ILLNESS:  Chad [name] is a 60 y.o. year old male with multiple medical problems including .lateonset CVA, urothelial cancer, prostate cancer, chronic hepatitis C, hyperlipidemia, colon surgery, protein calorie malnutrition, anxiety, depression.Chad [name] continues to reside at [facility] care center. Chad [name] transfers himself to w/c, requires assistance for ADL's. Chad [name] feeds himself with fair to declined appetite. Chad [name] continues to smoke daily.  Continues to be followed wound center for  bilateral lower extremity wounds. He is also followed by psychiatry with last date seen 09/13/2020 for recurrent major depressive episode moderate with history of mood disorder prescribed escitalopram with remeron.  No recent falls, hospitalizations. Staff endorses no new concerns or changes. I visited and observed Chad [name]. We talked about purpose of PC visit. Chad [name]. We talked about symptoms of pain, dyspnea which he denies. We talked about the wounds, improving and continues to go to wound clinic. We talked about Oncology visits with plan. Medical goals of care reviewed. We talked about residing at facility, coping strategies. We talked about smoking cessation, declined. We talked about f/u PC visit in 1 month for ongoing support, monitoring, chronic disease progression. Chad [name] in agreement, no changes to POC today. I updated nursing staff.   Dr [name] Oncology plan from 10/08/2020 visit.  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 JAN 5th, 2022- CT findings worrisome for progressive right renal pelvis and ureteral tumor and mass suspicious for colonic neoplasm involving the descending colon sigmoid colon junction region [see below].  Mild soft tissue thickening around the distal right ureter could reflect ureteral tumor; FEB 2022-  RIGHT renal pelvis and bladder are not evaluable by FDG PET imaging due to high activity of radiotracer within the urine. STABLE.   # proceed with  [place] today; Labs today reviewed; Labs today reviewed;  acceptable for treatment today.   #Descending colon mass/intermittent constipation-PET scan- FEB 2022-uptake in the cecum;  [history of Lynch syndrome].  Canceled- sec to blood thinner- Awaiting colonoscopy on 3/24.    # Iron deficiency anemia-hemoglobin 11.5; February 2022 ron studies/ferritin-LOW; plan Venofer with infusions;  STABLE.   # Bilateral LE ulcers-s/p wound care  evaluation; s/p PTCA with Dr.Dew. bil Arterial Dopp-wnl; STABLE [Dr.]  #DISPOSITION: #  keytruda/venofer today;  # follow up in 3 weeks-;MD labs-cbc/cmp;  Keytruda; venofer-  Palliative Care was asked to help to continue to address goals of care.   Oncology History Overview Note   # SEP-OCT 2019-right renal pelvis/ ureteral [cytology positive HIGH grade urothelial carcinoma [Dr.Brandon]   # NOV 24th 2019-Keytruda [consent]  #Right ureteral obstruction status post stent placement  # JAN 2019- Right Colon ca [ T4N1]  [[facility]]; NO adjuvant therapy  # Hep C/ # stroke of left side/weakness-2018 Nov [[place]]; active smoker  DIAGNOSIS: # 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; # Colon ca- stage III  GOALS: palliative  CURRENT/MOST RECENT THERAPY: Keytruda [C]    Urothelial cancer ([place])    Initial Diagnosis    Urothelial cancer ([place])    Ureteral cancer, right ([place])   05/26/2018 Initial Diagnosis    Ureteral cancer, right ([place])    06/21/2018 -  Chemotherapy     Patient is on Treatment Plan: UROTHELIAL CANCER- PEMBROLIZUMAB Q21D       CODE STATUS: 50%  PPS: DNR HOSPICE ELIGIBILITY/DIAGNOSIS: TBD  PAST MEDICAL HISTORY:  Past Medical History:  Diagnosis Date  . Anemia   . Anxiety   . ARF (acute respiratory failure) ([place])   . Bladder cancer ([place])   . COPD (chronic obstructive pulmonary disease) ([place])   . Depression   . Dysphagia   . Family history of colon cancer   . Family history of kidney cancer   . Family history of leukemia   . Family history of prostate cancer   . GERD (gastroesophageal reflux disease)   . Hepatitis    chronic hep c  . Hydronephrosis   . Hydronephrosis with ureteral stricture   . Hyperlipidemia   . Knee pain    Left  . Malignant neoplasm of colon ([place])   . Nerve pain   . Peripheral vascular disease ([place])   . Prostate cancer ([place])   . Stroke ([place])   . Urinary frequency   . Venous hypertension of both lower extremities     SOCIAL  HX:  Social History   Tobacco Use  . Smoking status: Current Every Day Smoker    Packs/day: 1.00    Types: Cigarettes  . Smokeless tobacco: Never Used  Substance Use Topics  . Alcohol use: Not Currently    ALLERGIES:  Allergies  Allergen Reactions  . Penicillins Rash     PHYSICAL EXAM:   General: debilitated, chronically ill, pleasant male Cardiovascular: regular rate and rhythm Pulmonary: clear ant fields Extremities: +BLE edema, no joint deformities Neurological: w/c dependent  Christin Ihor Gully, NP

## 2020-10-22 ENCOUNTER — Telehealth: Payer: Self-pay | Admitting: Gastroenterology

## 2020-10-22 NOTE — Telephone Encounter (Signed)
Needs to reschedule Colonoscopy, please call scheduler [name]- [phone]

## 2020-10-23 ENCOUNTER — Other Ambulatory Visit: Payer: Self-pay

## 2020-10-23 DIAGNOSIS — R9389 Abnormal findings on diagnostic imaging of other specified body structures: Secondary | ICD-10-CM | POA: Insufficient documentation

## 2020-10-23 NOTE — Telephone Encounter (Signed)
Facility called back to schedule, please call to schedule.

## 2020-10-23 NOTE — Telephone Encounter (Signed)
Spoke with [name] the scheduler at the care facility. Patients procedure has been rescheduled and instructions will be mailed out. Informed Chad [name] his nurse I will send a blood thinner clearance to his cardiologist to sign. All person involved verbalized understanding.

## 2020-10-23 NOTE — Telephone Encounter (Signed)
Returned call to patients care giver. LVM to call office back.

## 2020-10-23 NOTE — Progress Notes (Signed)
Procedure instructions will be mailed out today. Blood thinner request has been sent to Dr. Lucky Cowboy via right fax. Will leave sample prep at the front desk for pick up. Informed patient nurse Raelyn Number of all information.

## 2020-10-24 ENCOUNTER — Telehealth: Payer: Self-pay

## 2020-10-24 NOTE — Telephone Encounter (Signed)
-----   Message from Algernon Huxley, MD sent at 10/24/2020  9:53 AM EDT ----- Can stop five days before and resume the day after the procedure.   ----- Message ----- From: Storm Frisk, CMA Sent: 10/23/2020  12:18 PM EDT To: Algernon Huxley, MD

## 2020-10-24 NOTE — Telephone Encounter (Signed)
Received blood thinner clearance from Dr. Lucky Cowboy. See note. I will contact patient's nurse regarding Plavix.

## 2020-10-26 ENCOUNTER — Other Ambulatory Visit: Payer: Self-pay

## 2020-10-26 ENCOUNTER — Encounter: Payer: Medicaid Other | Attending: Physician Assistant | Admitting: Physician Assistant

## 2020-10-26 DIAGNOSIS — L97322 Non-pressure chronic ulcer of left ankle with fat layer exposed: Secondary | ICD-10-CM | POA: Diagnosis not present

## 2020-10-26 DIAGNOSIS — I69354 Hemiplegia and hemiparesis following cerebral infarction affecting left non-dominant side: Secondary | ICD-10-CM | POA: Diagnosis not present

## 2020-10-26 DIAGNOSIS — L97512 Non-pressure chronic ulcer of other part of right foot with fat layer exposed: Secondary | ICD-10-CM | POA: Insufficient documentation

## 2020-10-26 DIAGNOSIS — I872 Venous insufficiency (chronic) (peripheral): Secondary | ICD-10-CM | POA: Diagnosis not present

## 2020-10-26 DIAGNOSIS — L97412 Non-pressure chronic ulcer of right heel and midfoot with fat layer exposed: Secondary | ICD-10-CM | POA: Insufficient documentation

## 2020-10-26 NOTE — Progress Notes (Addendum)
Chad [name], Chad [name] ([id number]) Visit Report for 10/26/2020 Chief Complaint Document Details Patient Name: Chad [name], Chad [name] Date of Service: 10/26/2020 9:00 AM Medical Record Number: [id number] Patient Account Number: [id number] Date of Birth/Sex: Dec 23, 1960 (60 y.o. M) Treating RN: Carlene Coria Primary Care Provider: Clayborn [name] Other Clinician: Jeanine [name] Referring Provider: Clayborn [name] Treating Provider/Extender: Chad [name] in Treatment: 10 Information Obtained from: Patient Chief Complaint Left ankle and right foot and heel ulcers Electronic Signature(s) Signed: 10/26/2020 9:26:17 AM By: Chad Keeler PA-C Entered By: Chad [name] on 10/26/2020 09:26:17 Chad [name] ([id number]) -------------------------------------------------------------------------------- HPI Details Patient Name: Chad [name] Date of Service: 10/26/2020 9:00 AM Medical Record Number: [id number] Patient Account Number: [id number] Date of Birth/Sex: 15-Sep-1960 (60 y.o. M) Treating RN: Carlene Coria Primary Care Provider: Clayborn [name] Other Clinician: Jeanine [name] Referring Provider: Clayborn [name] Treating Provider/Extender: Chad [name] in Treatment: 10 History of Present Illness HPI Description: 10/08/18 on evaluation today patient actually presents to our office for initial evaluation concerning wounds that he has of the bilateral lower extremities. 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 He also has bilateral venous stasis. He apparently has been homeless currently following discharge from the hospital apparently he has been placed at [facility] which is is a skilled nursing facility locally. Nonetheless fortunately he does not show any signs of infection at this time which is good news. In fact several of the wound  actually appears to be showing some signs of improvement already in my pinion. There are a couple areas in the left leg in particular there likely gonna require some sharp debridement to help clear away some necrotic tissue and help with more sufficient healing. No fevers, chills, nausea, or vomiting noted at this time. 10/15/18 on evaluation today patient actually appears to be doing very well in regard to his bilateral lower extremities. He's been tolerating the dressing changes without complication. Fortunately there does not appear to be any evidence of active infection at this time which is great news. Overall I'm actually very pleased with how this has progressed in just one visits time. Readmission: 08/14/2020 upon evaluation today patient presents for re-evaluation here in our clinic. He is having issues with his left ankle region as well as his right toe and his right heel. He tells me that the toe and heel actually began as a area that was itching that he was scratching and then subsequently opened up into wounds. These may have been abscess areas I presume based on what I am seeing currently. With regard to his left ankle region he tells me this was a similar type occurrence although he does have venous stasis this very well may be more of a venous leg ulcer more than anything. Nonetheless I do believe that the patient would benefit from appropriate and aggressive wound care to try to help get things under better control here. He does have history of a stroke on the left side affecting him to some degree there that he is able to stand although he does have some residual weakness. Otherwise again the patient does have chronic venous insufficiency as previously noted. His arterial studies most recently obtained showed that he had an ABI on the right of 1.16 with a TBI of 0.52 and on the left and ABI of 1.14 with a TBI of 0.81. That was obtained on 06/19/2020.  08/28/2020 upon evaluation today  patient appears to be doing decently well in regard to his wounds in general. He has been tolerating the dressing changes without complication. Fortunately there does not appear to be any signs of active infection which is great news. With that being said I think the [facility] is doing a good job I would recommend that we likely continue with that currently. 09/11/2020 upon evaluation today patient's wounds did not appear to be doing too poorly but again he is not really showing signs of significant improvement with regard to any of the wounds on the right. None of them have Hydrofera Blue on them I am not exactly sure why this is not being followed as the facility did not contact us to let us know of any issues with obtaining dressings or otherwise. With that being said he is supposed to be using Hydrofera Blue on both of the wounds on the right foot as well as the ankle wound on the left side. 09/18/2020 upon evaluation today patient appears to be doing poorly with regard to his wounds. Again right now the left ankle in particular showed signs of extreme maceration. Apparently he was told by someone with staff at [facility] they could not get the [facility]. With that being said this is something that is never been relayed to [place] one way or another. Also the patient subsequently has not supposed to have a border gauze dressing on. He should have an ABD pad and roll gauze to secure as this drains much too much just to have a border gauze dressing to cover. Nonetheless the fact that they are not using the appropriate dressing is directly causing deterioration of the left ankle wound it is significantly worse today compared to what it was previous. I did attempt to call [employer] [facility] while the patient was here I called three times and got no one to even pick up the phone. After this I had my for an office coordinator call and she was able to finally get through and leave a message  with the D ON as of dictation of this note which is roughly about an hour and a half later I still have not been able to speak with anyone at the facility. 09/25/2020 upon evaluation today patient actually showing signs of good improvement which is excellent news. He has been tolerating the dressing changes without complication. Fortunately there is no signs of active infection which is great news. No fevers, chills, nausea, vomiting, or diarrhea. I do feel like the facility has been doing a much better job at taking care of him as far as the dressings are concerned. However the director of nursing never did call me back. 10/09/2020 upon evaluation today patient appears to be doing well with regard to his wound. The toe ulcer did require some debridement but the other 2 areas actually appear to be doing quite well. 10/19/2020 upon evaluation today patient actually appears to be doing very well in regard to his wounds. In fact the heel does appear to be completely healed. The toe is doing better in the medial ankle on the left is also doing better. Overall I think he is headed in the right direction. 10/26/2020 upon evaluation today patient appears to be doing well with regard to his wound. He is showing signs of improvement which is great news and overall I am very pleased with where things stand today. No fevers, chills, nausea, vomiting, or diarrhea. Electronic Signature(s) Signed:  10/26/2020 10:00:28 AM By: Chad Keeler PA-C Entered By: Chad [name] on 10/26/2020 10:00:28 Chad [name] ([id number]) Chad [name] ([id number]) -------------------------------------------------------------------------------- Physical Exam Details Patient Name: Chad [name] Date of Service: 10/26/2020 9:00 AM Medical Record Number: [id number] Patient Account Number: [id number] Date of Birth/Sex: April 09, 1961 (60 y.o. M) Treating RN: Carlene Coria Primary Care Provider: Clayborn [name] Other Clinician: Jeanine [name] Referring Provider: Clayborn [name] Treating Provider/Extender: Jeri Cos Weeks in Treatment: 28 Constitutional Well-nourished and well-hydrated in no acute distress. Respiratory normal breathing without difficulty. Psychiatric this patient is able to make decisions and demonstrates good insight into disease process. Alert and Oriented x 3. pleasant and cooperative. Notes Upon inspection patient's wounds did not require any sharp debridement today which is great news and overall I think he is making good progress. I am pleased with the overall appearance. Electronic Signature(s) Signed: 10/26/2020 10:09:22 AM By: Chad Keeler PA-C Entered By: Chad [name] on 10/26/2020 10:09:22 Chad [name] ([id number]) -------------------------------------------------------------------------------- Physician Orders Details Patient Name: Chad [name] Date of Service: 10/26/2020 9:00 AM Medical Record Number: [id number] Patient Account Number: [id number] Date of Birth/Sex: 05-04-1961 (60 y.o. 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 Wound Treatment Wound #6 - Toe Second Wound Laterality: Right Cleanser: Normal Saline 1 x Per Day/30 Days Discharge Instructions: Wash your hands with soap and water. Remove old dressing,  discard into plastic bag and place into trash. Cleanse the wound with Normal Saline prior to applying a clean dressing using gauze sponges, not tissues or cotton balls. Do not scrub or use excessive force. Pat dry using gauze sponges, not tissue or cotton balls. Primary Dressing: Silvercel Small 2x2 (in/in) (Generic) 1 x Per Day/30 Days Discharge Instructions: Apply Silvercel Small 2x2 (in/in) as instructed Secondary Dressing: Coverlet Latex-Free Fabric Adhesive Dressings 1 x Per Day/30 Days Discharge Instructions: Secure just the wounded toe Wound #8 - Ankle Wound Laterality: Left, Medial Cleanser: Normal Saline 1 x Per Day/30 Days Discharge Instructions: Wash your hands with soap and water. Remove old dressing, discard into plastic bag and place into trash. Cleanse the wound with Normal Saline prior to applying a clean dressing using gauze sponges, not tissues or cotton balls. Do not scrub or use excessive force. Pat dry using gauze sponges, not tissue or cotton balls. Peri-Wound Care: Desitin Maximum Strength Ointment 4 (oz) 1 x Per Day/30 Days Discharge Instructions: Zinc periwound Primary Dressing: Silvercel Small 2x2 (in/in) 1 x Per Day/30 Days Discharge Instructions: Apply Silvercel Small 2x2 (in/in) as instructed Secondary Dressing: ABD Pad 5x9 (in/in) 1 x Per Day/30 Days Discharge Instructions: Cover with ABD pad Secondary Dressing: Kerlix 4.5 x 4.1 (in/yd) 1 x Per Day/30 Days Discharge Instructions: Apply Kerlix 4.5 x 4.1 (in/yd) as instructed Secured With: 37M Medipore H Soft Cloth Surgical Tape, 2x2 (in/yd) 1 x Per Day/30 Days Electronic Signature(s) Signed: 10/26/2020 4:34:06 PM By: Chad Keeler PA-C Signed: 11/02/2020 8:11:40 AM By: Carlene Coria RN Entered By: Carlene Coria on 10/26/2020  09:52:56 Chad [name], Chad [name] ([id number]) -------------------------------------------------------------------------------- Problem List Details Patient Name: Chad [name], Chad [name] Date of Service:  10/26/2020 9:00 AM Medical Record Number: [id number] Patient Account Number: [id number] Date of Birth/Sex: [birth year] (60 y.o. M) Treating RN: Carlene Coria Primary Care Provider: Clayborn [name] Other Clinician: Jeanine [name] Referring Provider: Clayborn [name] Treating Provider/Extender: Chad [name] in Treatment: 10 Active Problems ICD-10 Encounter Code Description Active Date MDM Diagnosis I87.2 Venous insufficiency (chronic) (peripheral) 08/14/2020 No Yes L97.322 Non-pressure chronic ulcer of left ankle with fat layer exposed 08/14/2020 No Yes L97.412 Non-pressure chronic ulcer of right heel and midfoot with fat layer 08/14/2020 No Yes exposed L97.512 Non-pressure chronic ulcer of other part of right foot with fat layer 08/14/2020 No Yes exposed I69.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 M) Treating RN: Carlene Coria Primary Care Provider: Clayborn [name] Other Clinician: Jeanine [name] Referring Provider: Clayborn [name] Treating Provider/Extender: Chad [name] in Treatment: 10 Subjective Chief Complaint Information obtained from Patient Left ankle and right foot and heel ulcers History of Present Illness (HPI) 10/08/18 on evaluation today patient actually presents to our office for initial evaluation concerning wounds that he has of the bilateral lower extremities. 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 He also has bilateral venous stasis. He apparently has been homeless currently following discharge from the hospital apparently he has been placed at [facility] which is is a skilled nursing facility locally. Nonetheless fortunately he does not show any signs of infection at this time which is good news. In fact several of the wound actually appears to be showing some signs of improvement already in my pinion. There are a couple areas in the left leg in particular there likely gonna require some sharp debridement to help clear away some necrotic tissue and help with more sufficient healing. No fevers, chills, nausea, or vomiting noted at this time. 10/15/18 on evaluation today patient actually appears to be doing very well in regard to his bilateral lower extremities. He's been tolerating the dressing changes without complication. Fortunately there does not appear to be any evidence of active infection at this time which is great news. Overall I'm actually very pleased with how this has progressed in just one visits time. Readmission: 08/14/2020 upon evaluation today patient presents for re-evaluation here in our clinic. He is having issues with his left ankle region as well as his right toe and his right heel. He tells me that the toe and heel actually began as a area that was itching that he was scratching and then subsequently opened up into wounds. These may have been abscess areas I presume based on what I am seeing currently. With regard to his left ankle region he tells me this was a similar type occurrence although he does have venous stasis this very well may be more of a venous leg ulcer more than anything. Nonetheless I do believe that the patient would benefit from appropriate and aggressive wound care to try to help get things under better control here.  He does have  history of a stroke on the left side affecting him to some degree there that he is able to stand although he does have some residual weakness. Otherwise again the patient does have chronic venous insufficiency as previously noted. His arterial studies most recently obtained showed that he had an ABI on the right of 1.16 with a TBI of 0.52 and on the left and ABI of 1.14 with a TBI of 0.81. That was obtained on 06/19/2020. 08/28/2020 upon evaluation today patient appears to be doing decently well in regard to his wounds in general. He has been tolerating the dressing changes without complication. Fortunately there does not appear to be any signs of active infection which is great news. With that being said I think the [facility] is doing a good job I would recommend that we likely continue with that currently. 09/11/2020 upon evaluation today patient's wounds did not appear to be doing too poorly but again he is not really showing signs of significant improvement with regard to any of the wounds on the right. None of them have Hydrofera Blue on them I am not exactly sure why this is not being followed as the facility did not contact us to let us know of any issues with obtaining dressings or otherwise. With that being said he is supposed to be using Hydrofera Blue on both of the wounds on the right foot as well as the ankle wound on the left side. 09/18/2020 upon evaluation today patient appears to be doing poorly with regard to his wounds. Again right now the left ankle in particular showed signs of extreme maceration. Apparently he was told by someone with staff at [facility] they could not get the [facility]. With that being said this is something that is never been relayed to [place] one way or another. Also the patient subsequently has not supposed to have a border gauze dressing on. He should have an ABD pad and roll gauze to secure as this drains much too much just to have a border gauze  dressing to cover. Nonetheless the fact that they are not using the appropriate dressing is directly causing deterioration of the left ankle wound it is significantly worse today compared to what it was previous. I did attempt to call  healthcare while the patient was here I called three times and got no one to even pick up the phone. After this I had my for an office coordinator call and she was able to finally get through and leave a message with the D ON as of dictation of this note which is roughly about an hour and a half later I still have not been able to speak with anyone at the facility. 09/25/2020 upon evaluation today patient actually showing signs of good improvement which is excellent news. He has been tolerating the dressing changes without complication. Fortunately there is no signs of active infection which is great news. No fevers, chills, nausea, vomiting, or diarrhea. I do feel like the facility has been doing a much better job at taking care of him as far as the dressings are concerned. However the director of nursing never did call me back. 10/09/2020 upon evaluation today patient appears to be doing well with regard to his wound. The toe ulcer did require some debridement but the other 2 areas actually appear to be doing quite well. 10/19/2020 upon evaluation today patient actually appears to be doing very well in regard to his wounds.  In fact the heel does appear to be completely healed. The toe is doing better in the medial ankle on the left is also doing better. Overall I think he is headed in the right direction. 10/26/2020 upon evaluation today patient appears to be doing well with regard to his wound. He is showing signs of improvement which is great news and overall I am very pleased with where things stand today. No fevers, chills, nausea, vomiting, or diarrhea. Chad [name], Chad [name] ([id number]) Objective Constitutional Well-nourished and well-hydrated in no acute  distress. Vitals Time Taken: 9:16 AM, Height: 67 in, Weight: 160 lbs, BMI: 25.1, Temperature: 98.2 F, Pulse: 83 bpm, Respiratory Rate: 16 breaths/min, Blood Pressure: 107/74 mmHg. Respiratory normal breathing without difficulty. Psychiatric this patient is able to make decisions and demonstrates good insight into disease process. Alert and Oriented x 3. pleasant and cooperative. General Notes: Upon inspection patient's wounds did not require any sharp debridement today which is great news and overall I think he is making good progress. I am pleased with the overall appearance. Integumentary (Hair, Skin) Wound #6 status is Open. Original cause of wound was Gradually Appeared. The date acquired was: 03/28/2020. The wound has been in treatment 10 weeks. The wound is located on the Right Toe Second. The wound measures 1.4cm length x 0.4cm width x 0.1cm depth; 0.44cm^2 area and 0.044cm^3 volume. There is Fat Layer (Subcutaneous Tissue) exposed. There is no tunneling or undermining noted. There is a small amount of serous drainage noted. There is large (67-100%) red granulation within the wound bed. There is no necrotic tissue within the wound bed. Wound #8 status is Open. Original cause of wound was Gradually Appeared. The date acquired was: 07/12/2019. The wound has been in treatment 10 weeks. The wound is located on the Left,Medial Ankle. The wound measures 4.9cm length x 3cm width x 0.1cm depth; 11.545cm^2 area and 1.155cm^3 volume. There is Fat Layer (Subcutaneous Tissue) exposed. There is no tunneling or undermining noted. There is a large amount of serous drainage noted. The wound margin is flat and intact. There is large (67-100%) red, pink granulation within the wound bed. There is a small (1-33%) amount of necrotic tissue within the wound bed including Adherent Slough. 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 WOUND #6: - Toe Second Wound Laterality: Right Cleanser: Normal Saline 1 x Per Day/30 Days Discharge Instructions: Wash your hands with soap and water. Remove old dressing, discard into plastic bag and place into trash. Cleanse the wound with Normal Saline prior to applying a clean dressing using gauze sponges, not tissues or cotton balls. Do not scrub or use excessive force. Pat dry using gauze sponges, not tissue or cotton balls. Primary Dressing: Silvercel Small 2x2 (in/in) (Generic) 1 x Per Day/30 Days Discharge Instructions: Apply Silvercel Small 2x2 (in/in) as instructed Secondary Dressing: Coverlet Latex-Free Fabric Adhesive Dressings 1 x Per Day/30 Days Discharge Instructions: Secure just the wounded toe WOUND #8: - Ankle Wound Laterality: Left, Medial Cleanser: Normal Saline 1 x Per Day/30 Days Discharge Instructions: Wash your hands with soap and water. Remove old dressing, discard into plastic bag and place into trash. Cleanse the wound with Normal Saline prior to  applying a clean dressing using gauze sponges, not tissues or cotton balls. Do not scrub or use excessive force. Pat dry using gauze sponges, not tissue or cotton balls. Peri-Wound Care: Desitin Maximum Strength Ointment 4 (oz) 1 x Per Day/30 Days JIANNI, BATTEN ([id number]) Discharge Instructions: Zinc periwound Primary Dressing: Silvercel Small 2x2 (in/in) 1 x Per Day/30 Days Discharge Instructions: Apply Silvercel Small 2x2 (in/in) as instructed Secondary Dressing: ABD Pad 5x9 (in/in) 1 x Per Day/30 Days Discharge Instructions: Cover with ABD pad Secondary Dressing: Kerlix 4.5 x 4.1 (in/yd) 1  x Per Day/30 Days Discharge Instructions: Apply Kerlix 4.5 x 4.1 (in/yd) as instructed Secured With: 58M Medipore H Soft Cloth Surgical Tape, 2x2 (in/yd) 1 x Per Day/30 Days 1. Would recommend currently that going to continue with the wound care measures as before and the patient is in agreement the plan this includes the use of the silver alginate dressing which I think is doing a good job. 2. I am also can recommend Desitin around the edges of the ankle wound. 3. I am also can recommend that we have this secured with Curlex no adhesive dressing over the ankle area. We will see patient back for reevaluation in 1 week here in the clinic. If anything worsens or changes patient will contact our office for additional recommendations. Electronic Signature(s) Signed: 10/26/2020 10:09:47 AM By: Chad Keeler PA-C Entered By: Chad [name] on 10/26/2020 10:09:46 Chad [name] ([id number]) -------------------------------------------------------------------------------- SuperBill Details Patient Name: Chad [name] Date of Service: 10/26/2020 Medical Record Number: [id number] Patient Account Number: [id number] Date of Birth/Sex: 04-03-1961 (60 y.o. 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 Facility Procedures CPT4 Code: 94076808 Description: 99213 - WOUND CARE VISIT-LEV 3 EST PT Modifier: Quantity: 1 Physician  Procedures CPT4 Code: 8110315 Description: 99213 - WC PHYS LEVEL 3 - EST PT Modifier: Quantity: 1 CPT4 Code: Description: ICD-10 Diagnosis Description I87.2 Venous insufficiency (chronic) (peripheral) L97.322 Non-pressure chronic ulcer of left ankle with fat layer exposed L97.412 Non-pressure chronic ulcer of right heel and midfoot with fat layer exp L97.512  Non-pressure chronic ulcer of other part of right foot with fat layer e Modifier: osed xposed Quantity: Electronic Signature(s) Signed: 10/26/2020 10:10:02 AM By: Chad Keeler PA-C Entered By: Chad [name] on 10/26/2020 10:10:01

## 2020-10-29 ENCOUNTER — Inpatient Hospital Stay: Payer: Medicaid Other

## 2020-10-29 ENCOUNTER — Telehealth: Payer: Self-pay

## 2020-10-29 ENCOUNTER — Inpatient Hospital Stay: Payer: Medicaid Other | Attending: Internal Medicine

## 2020-10-29 ENCOUNTER — Other Ambulatory Visit: Payer: Self-pay

## 2020-10-29 ENCOUNTER — Inpatient Hospital Stay (HOSPITAL_BASED_OUTPATIENT_CLINIC_OR_DEPARTMENT_OTHER): Payer: Medicaid Other | Admitting: Internal Medicine

## 2020-10-29 DIAGNOSIS — Z79899 Other long term (current) drug therapy: Secondary | ICD-10-CM | POA: Insufficient documentation

## 2020-10-29 DIAGNOSIS — M2550 Pain in unspecified joint: Secondary | ICD-10-CM | POA: Insufficient documentation

## 2020-10-29 DIAGNOSIS — Z7982 Long term (current) use of aspirin: Secondary | ICD-10-CM | POA: Diagnosis not present

## 2020-10-29 DIAGNOSIS — F17210 Nicotine dependence, cigarettes, uncomplicated: Secondary | ICD-10-CM | POA: Diagnosis not present

## 2020-10-29 DIAGNOSIS — J449 Chronic obstructive pulmonary disease, unspecified: Secondary | ICD-10-CM | POA: Insufficient documentation

## 2020-10-29 DIAGNOSIS — Z9221 Personal history of antineoplastic chemotherapy: Secondary | ICD-10-CM | POA: Diagnosis not present

## 2020-10-29 DIAGNOSIS — R5382 Chronic fatigue, unspecified: Secondary | ICD-10-CM | POA: Insufficient documentation

## 2020-10-29 DIAGNOSIS — Z515 Encounter for palliative care: Secondary | ICD-10-CM

## 2020-10-29 DIAGNOSIS — K219 Gastro-esophageal reflux disease without esophagitis: Secondary | ICD-10-CM | POA: Insufficient documentation

## 2020-10-29 DIAGNOSIS — Z8042 Family history of malignant neoplasm of prostate: Secondary | ICD-10-CM | POA: Insufficient documentation

## 2020-10-29 DIAGNOSIS — R5381 Other malaise: Secondary | ICD-10-CM | POA: Insufficient documentation

## 2020-10-29 DIAGNOSIS — G8929 Other chronic pain: Secondary | ICD-10-CM | POA: Diagnosis not present

## 2020-10-29 DIAGNOSIS — Z5112 Encounter for antineoplastic immunotherapy: Secondary | ICD-10-CM | POA: Insufficient documentation

## 2020-10-29 DIAGNOSIS — Z85038 Personal history of other malignant neoplasm of large intestine: Secondary | ICD-10-CM | POA: Insufficient documentation

## 2020-10-29 DIAGNOSIS — C182 Malignant neoplasm of ascending colon: Secondary | ICD-10-CM

## 2020-10-29 DIAGNOSIS — Z8673 Personal history of transient ischemic attack (TIA), and cerebral infarction without residual deficits: Secondary | ICD-10-CM | POA: Diagnosis not present

## 2020-10-29 DIAGNOSIS — R531 Weakness: Secondary | ICD-10-CM | POA: Insufficient documentation

## 2020-10-29 DIAGNOSIS — Z8546 Personal history of malignant neoplasm of prostate: Secondary | ICD-10-CM | POA: Insufficient documentation

## 2020-10-29 DIAGNOSIS — Z7189 Other specified counseling: Secondary | ICD-10-CM

## 2020-10-29 DIAGNOSIS — C661 Malignant neoplasm of right ureter: Secondary | ICD-10-CM

## 2020-10-29 DIAGNOSIS — Z806 Family history of leukemia: Secondary | ICD-10-CM | POA: Diagnosis not present

## 2020-10-29 DIAGNOSIS — D509 Iron deficiency anemia, unspecified: Secondary | ICD-10-CM | POA: Insufficient documentation

## 2020-10-29 DIAGNOSIS — I739 Peripheral vascular disease, unspecified: Secondary | ICD-10-CM | POA: Insufficient documentation

## 2020-10-29 DIAGNOSIS — I10 Essential (primary) hypertension: Secondary | ICD-10-CM | POA: Diagnosis not present

## 2020-10-29 DIAGNOSIS — B182 Chronic viral hepatitis C: Secondary | ICD-10-CM | POA: Insufficient documentation

## 2020-10-29 DIAGNOSIS — E785 Hyperlipidemia, unspecified: Secondary | ICD-10-CM | POA: Diagnosis not present

## 2020-10-29 DIAGNOSIS — K639 Disease of intestine, unspecified: Secondary | ICD-10-CM | POA: Diagnosis not present

## 2020-10-29 DIAGNOSIS — D500 Iron deficiency anemia secondary to blood loss (chronic): Secondary | ICD-10-CM

## 2020-10-29 DIAGNOSIS — F418 Other specified anxiety disorders: Secondary | ICD-10-CM | POA: Diagnosis not present

## 2020-10-29 DIAGNOSIS — Z1509 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: Secondary | ICD-10-CM

## 2020-10-29 LAB — CBC WITH DIFFERENTIAL/PLATELET
Abs Immature Granulocytes: 0.01 10*3/uL (ref 0.00–0.07)
Basophils Absolute: 0.1 10*3/uL (ref 0.0–0.1)
Basophils Relative: 1 %
Eosinophils Absolute: 0.5 10*3/uL (ref 0.0–0.5)
Eosinophils Relative: 7 %
HCT: 40.2 % (ref 39.0–52.0)
Hemoglobin: 12.3 g/dL — ABNORMAL LOW (ref 13.0–17.0)
Immature Granulocytes: 0 %
Lymphocytes Relative: 23 %
Lymphs Abs: 1.6 10*3/uL (ref 0.7–4.0)
MCH: 23.4 pg — ABNORMAL LOW (ref 26.0–34.0)
MCHC: 30.6 g/dL (ref 30.0–36.0)
MCV: 76.6 fL — ABNORMAL LOW (ref 80.0–100.0)
Monocytes Absolute: 0.5 10*3/uL (ref 0.1–1.0)
Monocytes Relative: 7 %
Neutro Abs: 4.3 10*3/uL (ref 1.7–7.7)
Neutrophils Relative %: 62 %
Platelets: 222 10*3/uL (ref 150–400)
RBC: 5.25 MIL/uL (ref 4.22–5.81)
RDW: 19.2 % — ABNORMAL HIGH (ref 11.5–15.5)
WBC: 7 10*3/uL (ref 4.0–10.5)
nRBC: 0 % (ref 0.0–0.2)

## 2020-10-29 LAB — COMPREHENSIVE METABOLIC PANEL
ALT: 20 U/L (ref 0–44)
AST: 35 U/L (ref 15–41)
Albumin: 3.8 g/dL (ref 3.5–5.0)
Alkaline Phosphatase: 103 U/L (ref 38–126)
Anion gap: 8 (ref 5–15)
BUN: 19 mg/dL (ref 8–23)
CO2: 25 mmol/L (ref 22–32)
Calcium: 8.7 mg/dL — ABNORMAL LOW (ref 8.9–10.3)
Chloride: 104 mmol/L (ref 98–111)
Creatinine, Ser: 0.94 mg/dL (ref 0.61–1.24)
GFR, Estimated: >60 mL/min (ref >60)
Glucose, Bld: 89 mg/dL (ref 70–99)
Potassium: 4.2 mmol/L (ref 3.5–5.1)
Sodium: 137 mmol/L (ref 135–145)
Total Bilirubin: 0.5 mg/dL (ref 0.3–1.2)
Total Protein: 7.9 g/dL (ref 6.5–8.1)

## 2020-10-29 MED ORDER — SODIUM CHLORIDE 0.9 % IV SOLN
Freq: Once | INTRAVENOUS | Status: AC
Start: 2020-10-29 — End: 2020-10-29
  Filled 2020-10-29: qty 250

## 2020-10-29 MED ORDER — SODIUM CHLORIDE 0.9% FLUSH
10.0000 mL | INTRAVENOUS | Status: DC | PRN
  Administered 2020-10-29: 10 mL via INTRAVENOUS
  Filled 2020-10-29: qty 10

## 2020-10-29 MED ORDER — HEPARIN SOD (PORK) LOCK FLUSH 100 UNIT/ML IV SOLN
INTRAVENOUS | Status: AC
  Filled 2020-10-29: qty 5

## 2020-10-29 MED ORDER — SODIUM CHLORIDE 0.9 % IV SOLN
200.0000 mg | Freq: Once | INTRAVENOUS | Status: AC
  Administered 2020-10-29: 200 mg via INTRAVENOUS
  Filled 2020-10-29: qty 8

## 2020-10-29 MED ORDER — HEPARIN SOD (PORK) LOCK FLUSH 100 UNIT/ML IV SOLN
500.0000 [IU] | Freq: Once | INTRAVENOUS | Status: DC | PRN
  Filled 2020-10-29: qty 5

## 2020-10-29 MED ORDER — IRON SUCROSE 20 MG/ML IV SOLN
200.0000 mg | Freq: Once | INTRAVENOUS | Status: AC
  Administered 2020-10-29: 200 mg via INTRAVENOUS
  Filled 2020-10-29: qty 10

## 2020-10-29 MED ORDER — HEPARIN SOD (PORK) LOCK FLUSH 100 UNIT/ML IV SOLN
500.0000 [IU] | Freq: Once | INTRAVENOUS | Status: AC
  Administered 2020-10-29: 500 [IU] via INTRAVENOUS
  Filled 2020-10-29: qty 5

## 2020-10-29 NOTE — Progress Notes (Signed)
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  Is alone.  Marshall Kampf 60 y.o.  male above history of stage IV-ureteral cancer/with multiple co-morbidities currently on Keytruda is here for follow-up.  Patient still currently awaiting colonoscopy.  Denies any abdominal pain.  Any blood in stools or black or stools.  Chronic fatigue.  Chronic joint pains.   Review of Systems  Constitutional: Positive for malaise/fatigue. Negative for chills, diaphoresis, fever and weight loss.  HENT: Negative for nosebleeds and sore throat.   Eyes: Negative for double vision.  Respiratory: Positive for cough and shortness of breath. Negative for hemoptysis and wheezing.   Cardiovascular: Negative for chest pain, palpitations and orthopnea.  Gastrointestinal: Positive for constipation. Negative for abdominal pain, blood in stool, diarrhea, heartburn, melena, nausea and vomiting.  Genitourinary: Negative for dysuria, frequency and  urgency.  Musculoskeletal: Positive for back pain and joint pain.  Skin: Negative.  Negative for itching and rash.  Neurological: Positive for focal weakness. Negative for dizziness, tingling, weakness and headaches.       Chronic left-sided weakness upper than lower extremity.  Endo/Heme/Allergies: Does not bruise/bleed easily.  Psychiatric/Behavioral: Negative for depression. The patient is not nervous/anxious and does not have insomnia.      MEDICAL HISTORY:  Past Medical History:  Diagnosis Date  . Anemia   . Anxiety   . ARF (acute respiratory failure) ([place])   . Bladder cancer ([place])   . COPD (chronic obstructive pulmonary disease) ([place])   . Depression   . Dysphagia   . Family history of colon cancer   . Family history of kidney cancer   . Family history of leukemia   . Family history of prostate cancer   . GERD (gastroesophageal reflux disease)   . Hepatitis    chronic hep c  . Hydronephrosis   . Hydronephrosis with ureteral stricture   . Hyperlipidemia   . Knee pain    Left  . Malignant neoplasm of colon ([place])   . Nerve pain   . Peripheral vascular disease ([place])   . Prostate cancer ([place])   . Stroke ([place])   . Urinary frequency   . Venous hypertension of both lower extremities     SURGICAL HISTORY: Past Surgical History:  Procedure Laterality Date  . COLON SURGERY     En bloc extended right hemicolectomy 07/2017  . CYSTOSCOPY W/ RETROGRADES Right 08/30/2018   Procedure: CYSTOSCOPY WITH RETROGRADE PYELOGRAM;  Surgeon: Hollice Espy, MD;  Location: [facility];  Service: Urology;  Laterality: Right;  . CYSTOSCOPY WITH STENT PLACEMENT Right 04/25/2018   Procedure: CYSTOSCOPY WITH STENT PLACEMENT;  Surgeon: Hollice Espy, MD;  Location: [facility];  Service: Urology;  Laterality: Right;  . CYSTOSCOPY WITH STENT PLACEMENT Right 08/30/2018   Procedure: [place] WITH STENT Exchange;  Surgeon: Hollice Espy, MD;  Location: [facility];  Service: Urology;  Laterality: Right;  .  CYSTOSCOPY WITH STENT PLACEMENT Right 03/07/2019   Procedure: CYSTOSCOPY WITH STENT Exchange;  Surgeon: Hollice Espy, MD;  Location: [facility];  Service: Urology;  Laterality: Right;  . CYSTOSCOPY WITH STENT PLACEMENT Right 11/21/2019   Procedure: CYSTOSCOPY WITH STENT Exchange;  Surgeon: Hollice Espy, MD;  Location: [facility];  Service: Urology;  Laterality: Right;  . LOWER EXTREMITY ANGIOGRAPHY Left 05/23/2019   Procedure: LOWER EXTREMITY ANGIOGRAPHY;  Surgeon: Algernon Huxley, MD;  Location: [facility];  Service: Cardiovascular;  Laterality: Left;  . LOWER EXTREMITY ANGIOGRAPHY Right 05/30/2019   Procedure: LOWER EXTREMITY ANGIOGRAPHY;  Surgeon: Algernon Huxley, MD;  Location: [facility];  Service: Cardiovascular;  Laterality: Right;  . LOWER EXTREMITY ANGIOGRAPHY Right 02/13/2020   Procedure: LOWER EXTREMITY ANGIOGRAPHY;  Surgeon: Algernon Huxley, MD;  Location: [facility];  Service: Cardiovascular;  Laterality: Right;  . LOWER EXTREMITY ANGIOGRAPHY Left 02/20/2020   Procedure: LOWER EXTREMITY ANGIOGRAPHY;  Surgeon: Algernon Huxley, MD;  Location: [facility];  Service: Cardiovascular;  Laterality: Left;  . PORTA CATH INSERTION N/A 02/28/2019   Procedure: PORTA CATH INSERTION;  Surgeon: Algernon Huxley, MD;  Location: [facility];  Service: Cardiovascular;  Laterality: N/A;  . tumor removed       SOCIAL HISTORY: Social History   Socioeconomic History  . Marital status: Single    Spouse name: Not on file  . Number of children: Not on file  . Years of education: Not on file  . Highest education level: Not on file  Occupational History  . Not on file  Tobacco Use  . Smoking status: Current Every Day Smoker    Packs/day: 1.00    Types: Cigarettes  . Smokeless tobacco: Never Used  Vaping Use  . Vaping Use: Never used  Substance and Sexual Activity  . Alcohol use: Not Currently  . Drug use: Not Currently  . Sexual activity: Not Currently  Other  Topics Concern  . Not on file  Social History Narrative    used to live [place]; moved  To [place]- end of April 2019; in Nursing home; 1pp/day; quit alcohol. 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        Family- dad- prostate ca [at 19y]; brother- 20 died of prostate cancer; brother- 57- no cancers [[place]]; sonGerald Stabs [[place]];[name]-32y prostate ca [facility] [place]]; daughter- 43 [[place]]; another daughter 81 [[place]/addict]. will refer genetics counseling. Given MSI- abnormal; highly suspicious of Lynch syndrome.  Patient's son [name] aware of high possible lynch syndrome.   Social Determinants of Health   Financial Resource Strain: Not on file  Food Insecurity: Not on file  Transportation Needs: Not on file  Physical Activity: Not on file  Stress: Not on file  Social Connections: Not on file  Intimate Partner Violence: Not on file    FAMILY HISTORY: Family History  Problem Relation Age of Onset  . Prostate cancer Father 52  . Cancer Brother 40       unsure type  . Cancer Paternal Uncle  unsure type  . Cancer Maternal Grandmother        unsure type  . Cancer Paternal Grandmother        unsure type  . Kidney cancer Paternal Grandfather   . Cancer Other        unsure types  . Leukemia Son   . Cancer Son        other cancers, possibly colon    ALLERGIES:  is allergic to penicillins.  MEDICATIONS:  Current Outpatient Medications  Medication Sig Dispense Refill  . acetaminophen (TYLENOL) 325 MG tablet Take 650 mg by mouth 3 (three) times daily.     Marland Kitchen aspirin EC 81 MG tablet Take 1 tablet (81 mg total) by mouth daily. 150 tablet 2  . atorvastatin (LIPITOR) 10 MG tablet Take 1 tablet (10 mg total) by mouth daily. 30 tablet 11  . Cholecalciferol (VITAMIN D) 50 MCG (2000 UT) CAPS Take by mouth.    . clopidogrel (PLAVIX) 75 MG tablet Take 1 tablet (75 mg total) by mouth daily. 30 tablet 11  . cyclobenzaprine (FLEXERIL) 10 MG tablet Take 10 mg by mouth at bedtime.    Marland Kitchen escitalopram (LEXAPRO)  5 MG tablet Take 5 mg by mouth daily.    Marland Kitchen gabapentin (NEURONTIN) 100 MG capsule Take 100 mg by mouth 3 (three) times daily.    . hydroxypropyl methylcellulose / hypromellose (ISOPTO TEARS / GONIOVISC) 2.5 % ophthalmic solution Place 1 drop into both eyes every 12 (twelve) hours.    . mirtazapine (REMERON) 7.5 MG tablet Take 7.5 mg by mouth at bedtime.    . Oxycodone HCl 10 MG TABS Take 10 mg by mouth every 6 (six) hours as needed for pain.    . pantoprazole (PROTONIX) 40 MG tablet Take 40 mg by mouth daily.    . Pollen Extracts (PROSTAT PO) Take 30 mLs by mouth.    . pravastatin (PRAVACHOL) 10 MG tablet Take 10 mg by mouth daily.    . RESTORE CALCIUM ALGINATE EX Apply topically. Note: Dressing located on left ankle. Facility cleaning wound "every day shift and applying wound cleanser and appication of calcium Alginate and cover with dry dressing and wrapped with Kerlix"     No current facility-administered medications for this visit.   Facility-Administered Medications Ordered in Other Visits  Medication Dose Route Frequency Provider Last Rate Last Admin  . heparin lock flush 100 unit/mL  500 Units Intracatheter Once PRN Charlaine Dalton R, MD      . sodium chloride flush (NS) 0.9 % injection 10 mL  10 mL Intravenous PRN Cammie Sickle, MD   10 mL at 10/29/20 0823      .  PHYSICAL EXAMINATION: ECOG PERFORMANCE STATUS: 1 - Symptomatic but completely ambulatory  Vitals:   10/29/20 0902  BP: 117/79  Pulse: 74  Resp: 20  Temp: (!) 96.9 F (36.1 C)   Filed Weights   10/29/20 0902  Weight: 155 lb 3.2 oz (70.4 kg)    Physical Exam Constitutional:      Comments: In wheelchair. Cachectic.  Appears older than his stated age.  Alone.  HENT:     Head: Normocephalic and atraumatic.     Mouth/Throat:     Pharynx: No oropharyngeal exudate.  Eyes:     Pupils: Pupils are equal, round, and reactive to light.  Cardiovascular:     Rate and Rhythm: Normal rate and regular  rhythm.  Pulmonary:     Effort: No respiratory distress.  Breath sounds: No wheezing.  Abdominal:     General: Bowel sounds are normal. There is no distension.     Palpations: Abdomen is soft. There is no mass.     Tenderness: There is no abdominal tenderness. There is no guarding or rebound.  Musculoskeletal:        General: No tenderness. Normal range of motion.     Cervical back: Normal range of motion and neck supple.  Skin:    General: Skin is warm.     Comments: Bilateral lower extremity ulcerations noted.  Pulses intact bilaterally.  Neurological:     Mental Status: He is oriented to person, place, and time.     Comments: Sleepy but easily arousable.  Chronic weakness left upper and lower extremity.  Psychiatric:        Mood and Affect: Affect normal.      LABORATORY DATA:  I have reviewed the data as listed Lab Results  Component Value Date   WBC 7.0 10/29/2020   HGB 12.3 (L) 10/29/2020   HCT 40.2 10/29/2020   MCV 76.6 (L) 10/29/2020   PLT 222 10/29/2020   Recent Labs    03/08/20 0905 03/29/20 0824 04/19/20 0858 05/10/20 0915 09/17/20 0925 10/08/20 0900 10/29/20 0816  NA 137 139 134*   < > 136 136 137  K 4.4 4.1 3.8   < > 3.9 4.0 4.2  CL 103 104 101   < > 102 103 104  CO2 _0 < > _1 GLUCOSE 88 94 140*   < > 149* 99 89  BUN _2 < > _3 CREATININE 0.85 0.76 0.92   < > 0.83 1.00 0.94  CALCIUM 8.8* 8.6* 8.4*   < > 8.5* 8.7* 8.7*  GFRNONAA >60 >60 >60   < > >60 >60 >60  GFRAA >60 >60 >60  --   --   --   --   PROT 8.0 7.7 7.8   < > 7.4 7.9 7.9  ALBUMIN 3.8 3.6 3.7   < > 3.3* 3.6 3.8  AST 32 28 33   < > 37 37 35  ALT _4 < > _5 ALKPHOS 127* 118 115   < > 102 109 103  BILITOT 0.6 0.4 0.4   < > 0.5 0.6 0.5   < > = values in this interval not displayed.    RADIOGRAPHIC STUDIES: I have personally reviewed the radiological images as listed and agreed with the findings in the report. No results found.  ASSESSMENT  & PLAN:   Ureteral cancer, right ([place]) # High-grade urothelial cancer/cytology; likely of the right renal pelvis /upper ureter. JAN 5th, 2022- CT findings worrisome for progressive right renal pelvis and ureteral tumor and mass suspicious for colonic neoplasm involving the descending colon sigmoid colon junction region [see below].  Mild soft tissue thickening around the distal right ureter could reflect ureteral tumor; FEB 2022-  RIGHT renal pelvis and bladder are not evaluable by FDG PET imaging due to high activity of radiotracer within the urine. STABLE.   # proceed with  [place] today; Labs today reviewed; Labs today reviewed;  acceptable for treatment today.   #Descending colon mass/intermittent constipation-PET scan- FEB 2022-uptake in the cecum;  [history of Lynch syndrome].  Canceled- sec to blood thinner- Awaiting colonoscopy on  4/12  # Iron deficiency anemia-hemoglobin 11.5; February 2022 ron studies/ferritin-LOW; plan Venofer with  infusions;  STABLE.   # Bilateral LE ulcers-s/p wound care evaluation; s/p PTCA with Dr.Dew. bil Arterial Dopp-wnl; STABLE [Dr.]  #DISPOSITION: #  keytruda/venofer today;  # follow up in 3 weeks-;MD labs-cbc/cmp;  Keytruda; venofer- Dr.B    All questions were answered. The patient knows to call the clinic with any problems, questions or concerns.    Cammie Sickle, MD 10/29/2020 3:28 PM

## 2020-10-29 NOTE — Telephone Encounter (Signed)
[facility] per Dr. B to remind them to make sure patient gets to his scheduled colonoscopy appointment on 4/12. LVM with this information. Asked for a return call if needed.

## 2020-10-29 NOTE — Progress Notes (Addendum)
Chad, [name] ([id number]) Visit Report for 10/26/2020 Arrival Information Details Patient Name: Chad [name], [place] Date of Service: 10/26/2020 9:00 AM Medical Record Number: [id number] Patient Account Number: [id number] Date of Birth/Sex: 12/24/1960 (60 y.o. M) Treating RN: Dolan Amen Primary Care Baylee Mccorkel: Clayborn Bigness Other Clinician: Jeanine Luz Referring Teniqua Marron: Clayborn Bigness Treating Jeanna Giuffre/Extender: Skipper Cliche in Treatment: 10 Visit Information History Since Last Visit Had a fall or experienced change in No Patient Arrived: Wheel Chair activities of daily living that may affect Arrival Time: 09:16 risk of falls: Accompanied By: self Hospitalized since last visit: No Transfer Assistance: None Has Dressing in Place as Prescribed: Yes Patient Identification Verified: Yes Pain Present Now: No Secondary Verification Process Completed: Yes Patient Requires Transmission-Based No Precautions: Patient Has Alerts: Yes Patient Alerts: Patient on Blood Thinner ABI right 1.16 ABI left 1.14 Electronic Signature(s) Signed: 10/26/2020 3:53:39 PM By: Georges Mouse, Minus Breeding RN Entered By: Georges Mouse, Minus Breeding on 10/26/2020 09:27:45 Chad [name] ([id number]) -------------------------------------------------------------------------------- Clinic Level of Care Assessment Details Patient Name: Chad [name] Date of Service: 10/26/2020 9:00 AM Medical Record Number: [id number] Patient Account Number: [id number] Date of Birth/Sex: Nov 30, 1960 (60 y.o. M) Treating RN: Carlene Coria Primary Care Alexxia Stankiewicz: Clayborn Bigness Other Clinician: Jeanine Luz Referring Rosser Collington: Clayborn Bigness Treating Rika Daughdrill/Extender: Skipper Cliche in Treatment: 10 Clinic Level of Care Assessment Items TOOL 4 Quantity Score X - Use when only an EandM is performed on FOLLOW-UP visit 1 0 ASSESSMENTS - Nursing Assessment / Reassessment X - Reassessment of Co-morbidities (includes updates in patient  status) 1 10 X- 1 5 Reassessment of Adherence to Treatment Plan ASSESSMENTS - Wound and Skin Assessment / Reassessment []  - Simple Wound Assessment / Reassessment - one wound 0 X- 2 5 Complex Wound Assessment / Reassessment - multiple wounds []  - 0 Dermatologic / Skin Assessment (not related to wound area) ASSESSMENTS - Focused Assessment []  - Circumferential Edema Measurements - multi extremities 0 []  - 0 Nutritional Assessment / Counseling / Intervention []  - 0 Lower Extremity Assessment (monofilament, tuning fork, pulses) []  - 0 Peripheral Arterial Disease Assessment (using hand held doppler) ASSESSMENTS - Ostomy and/or Continence Assessment and Care []  - Incontinence Assessment and Management 0 []  - 0 Ostomy Care Assessment and Management (repouching, etc.) PROCESS - Coordination of Care X - Simple Patient / Family Education for ongoing care 1 15 []  - 0 Complex (extensive) Patient / Family Education for ongoing care X- 1 10 Staff obtains Programmer, systems, Records, Test Results / Process Orders []  - 0 Staff telephones HHA, Nursing Homes / Clarify orders / etc []  - 0 Routine Transfer to another Facility (non-emergent condition) []  - 0 Routine Hospital Admission (non-emergent condition) []  - 0 New Admissions / Biomedical engineer / Ordering NPWT, Apligraf, etc. []  - 0 Emergency Hospital Admission (emergent condition) X- 1 10 Simple Discharge Coordination []  - 0 Complex (extensive) Discharge Coordination PROCESS - Special Needs []  - Pediatric / Minor Patient Management 0 []  - 0 Isolation Patient Management []  - 0 Hearing / Language / Visual special needs []  - 0 Assessment of Community assistance (transportation, D/C planning, etc.) []  - 0 Additional assistance / Altered mentation []  - 0 Support Surface(s) Assessment (bed, cushion, seat, etc.) INTERVENTIONS - Wound Cleansing / Measurement Chastain, Damiano ([id number]) X- 1 5 Simple Wound Cleansing - one wound []  -  0 Complex Wound Cleansing - multiple wounds X- 1 5 Wound Imaging (photographs - any number of wounds) []  - 0 Wound Tracing (instead of photographs) X- 1 5 Simple Wound Measurement -  one wound []  - 0 Complex Wound Measurement - multiple wounds INTERVENTIONS - Wound Dressings X - Small Wound Dressing one or multiple wounds 2 10 []  - 0 Medium Wound Dressing one or multiple wounds []  - 0 Large Wound Dressing one or multiple wounds X- 1 5 Application of Medications - topical []  - 0 Application of Medications - injection INTERVENTIONS - Miscellaneous []  - External ear exam 0 []  - 0 Specimen Collection (cultures, biopsies, blood, body fluids, etc.) []  - 0 Specimen(s) / Culture(s) sent or taken to Lab for analysis []  - 0 Patient Transfer (multiple staff / Harrel Lemon Lift / Similar devices) []  - 0 Simple Staple / Suture removal (25 or less) []  - 0 Complex Staple / Suture removal (26 or more) []  - 0 Hypo / Hyperglycemic Management (close monitor of Blood Glucose) []  - 0 Ankle / Brachial Index (ABI) - do not check if billed separately X- 1 5 Vital Signs Has the patient been seen at the hospital within the last three years: Yes Total Score: 105 Level Of Care: New/Established - Level 3 Electronic Signature(s) Signed: 11/02/2020 8:11:40 AM By: Carlene Coria RN Entered By: Carlene Coria on 10/26/2020 09:53:59 Chad [name] ([id number]) -------------------------------------------------------------------------------- Encounter Discharge Information Details Patient Name: Chad [name] Date of Service: 10/26/2020 9:00 AM Medical Record Number: [id number] Patient Account Number: [id number] Date of Birth/Sex: 1961/06/26 (60 y.o. M) Treating RN: Carlene Coria Primary Care Hasani Diemer: Clayborn Bigness Other Clinician: Jeanine Luz Referring Javen Hinderliter: Clayborn Bigness Treating Sugey Trevathan/Extender: Skipper Cliche in Treatment: 10 Encounter Discharge Information Items Discharge Condition:  Stable Ambulatory Status: Wheelchair Discharge Destination: Home Transportation: Other Accompanied By: self Schedule Follow-up Appointment: Yes Clinical Summary of Care: Notes TRANSPORTATION BRINGS PATIENT Electronic Signature(s) Signed: 11/02/2020 8:11:40 AM By: Carlene Coria RN Entered By: Carlene Coria on 10/26/2020 10:23:38 Chad [name] ([id number]) -------------------------------------------------------------------------------- Lower Extremity Assessment Details Patient Name: Chad [name] Date of Service: 10/26/2020 9:00 AM Medical Record Number: [id number] Patient Account Number: [id number] Date of Birth/Sex: 24-Dec-1960 (60 y.o. M) Treating RN: Dolan Amen Primary Care Maedell Hedger: Clayborn Bigness Other Clinician: Jeanine Luz Referring Lindamarie Maclachlan: Clayborn Bigness Treating Tongela Encinas/Extender: Jeri Cos Weeks in Treatment: 10 Edema Assessment Assessed: Shirlyn Goltz: Yes] [Right: Yes] Edema: [Left: No] [Right: No] Vascular Assessment Pulses: Dorsalis Pedis Palpable: [Left:Yes] [Right:Yes] Electronic Signature(s) Signed: 10/26/2020 3:53:39 PM By: Georges Mouse, Minus Breeding RN Entered By: Georges Mouse, Minus Breeding on 10/26/2020 09:30:05 Chad [name] ([id number]) -------------------------------------------------------------------------------- Multi Wound Chart Details Patient Name: Chad [name] Date of Service: 10/26/2020 9:00 AM Medical Record Number: [id number] Patient Account Number: [id number] Date of Birth/Sex: 1961-07-01 (60 y.o. M) Treating RN: Carlene Coria Primary Care Everhett Bozard: Clayborn Bigness Other Clinician: Jeanine Luz Referring Nero Sawatzky: Clayborn Bigness Treating Tkeyah Burkman/Extender: Skipper Cliche in Treatment: 10 Vital Signs Height(in): 19 Pulse(bpm): 32 Weight(lbs): 160 Blood Pressure(mmHg): 107/74 Body Mass Index(BMI): 25 Temperature(F): 98.2 Respiratory Rate(breaths/min): 16 Photos: [N/A:N/A] Wound Location: Right Toe Second Left, Medial Ankle N/A Wounding Event:  Gradually Appeared Gradually Appeared N/A Primary Etiology: Abscess Venous Leg Ulcer N/A Comorbid History: Anemia, Chronic Obstructive Anemia, Chronic Obstructive N/A Pulmonary Disease (COPD), Pulmonary Disease (COPD), Hepatitis C, Received Hepatitis C, Received Chemotherapy Chemotherapy Date Acquired: 03/28/2020 07/12/2019 N/A Weeks of Treatment: 10 10 N/A Wound Status: Open Open N/A Measurements L x W x D (cm) 1.4x0.4x0.1 4.9x3x0.1 N/A Area (cm) : 0.44 11.545 N/A Volume (cm) : 0.044 1.155 N/A % Reduction in Area: 63.40% -96.00% N/A % Reduction in Volume: 63.30% -96.10% N/A Classification: Full Thickness Without Exposed Full Thickness Without Exposed N/A Support Structures Support Structures Exudate  Amount: Small Large N/A Exudate Type: Serous Serous N/A Exudate Color: amber amber N/A Wound Margin: N/A Flat and Intact N/A Granulation Amount: Large (67-100%) Large (67-100%) N/A Granulation Quality: Red Red, Pink N/A Necrotic Amount: None Present (0%) Small (1-33%) N/A Exposed Structures: Fat Layer (Subcutaneous Tissue): Fat Layer (Subcutaneous Tissue): N/A Yes Yes Fascia: No Fascia: No Tendon: No Tendon: No Muscle: No Muscle: No Joint: No Joint: No Bone: No Bone: No Epithelialization: None Small (1-33%) N/A Treatment Notes Electronic Signature(s) Signed: 11/02/2020 8:11:40 AM By: Carlene Coria RN Entered By: Carlene Coria on 10/26/2020 09:50:33 Chad [name] ([id number]) -------------------------------------------------------------------------------- Multi-Disciplinary Care Plan Details Patient Name: Chad [name] Date of Service: 10/26/2020 9:00 AM Medical Record Number: [id number] Patient Account Number: [id number] Date of Birth/Sex: 1961-01-12 (60 y.o. M) Treating RN: Carlene Coria Primary Care Mayeli Bornhorst: Clayborn Bigness Other Clinician: Jeanine Luz Referring Kiondra Caicedo: Clayborn Bigness Treating Yunique Dearcos/Extender: Jeri Cos Weeks in Treatment: 10 Active  Inactive Electronic Signature(s) Signed: 11/02/2020 8:11:40 AM By: Carlene Coria RN Entered By: Carlene Coria on 10/26/2020 09:50:24 Chad [name] ([id number]) -------------------------------------------------------------------------------- Pain Assessment Details Patient Name: Chad [name] Date of Service: 10/26/2020 9:00 AM Medical Record Number: [id number] Patient Account Number: [id number] Date of Birth/Sex: November 13, 1960 (60 y.o. M) Treating RN: Dolan Amen Primary Care Nivedita Mirabella: Clayborn Bigness Other Clinician: Jeanine Luz Referring Tylin Force: Clayborn Bigness Treating Kinnley Paulson/Extender: Skipper Cliche in Treatment: 10 Active Problems Location of Pain Severity and Description of Pain Patient Has Paino No Site Locations Rate the pain. Current Pain Level: 0 Pain Management and Medication Current Pain Management: Electronic Signature(s) Signed: 10/26/2020 3:53:39 PM By: Georges Mouse, Minus Breeding RN Entered By: Georges Mouse, Minus Breeding on 10/26/2020 09:28:09 Chad [name] ([id number]) -------------------------------------------------------------------------------- Patient/Caregiver Education Details Patient Name: Chad [name] Date of Service: 10/26/2020 9:00 AM Medical Record Number: [id number] Patient Account Number: [id number] Date of Birth/Gender: 02-07-1961 (60 y.o. M) Treating RN: Carlene Coria Primary Care Physician: Clayborn Bigness Other Clinician: Jeanine Luz Referring Physician: Clayborn Bigness Treating Physician/Extender: Skipper Cliche in Treatment: 10 Education Assessment Education Provided To: Patient Education Topics Provided Wound/Skin Impairment: Methods: Explain/Verbal Responses: State content correctly Electronic Signature(s) Signed: 11/02/2020 8:11:40 AM By: Carlene Coria RN Entered By: Carlene Coria on 10/26/2020 09:54:16 Chad [name] ([id number]) -------------------------------------------------------------------------------- Wound Assessment  Details Patient Name: Chad [name] Date of Service: 10/26/2020 9:00 AM Medical Record Number: [id number] Patient Account Number: [id number] Date of Birth/Sex: April 03, 1961 (60 y.o. M) Treating RN: Dolan Amen Primary Care Justene Jensen: Clayborn Bigness Other Clinician: Jeanine Luz Referring Zayven Powe: Clayborn Bigness Treating Cornelious Diven/Extender: Jeri Cos Weeks in Treatment: 10 Wound Status Wound Number: 6 Primary Abscess Etiology: Wound Location: Right Toe Second Wound Open Wounding Event: Gradually Appeared Status: Date Acquired: 03/28/2020 Comorbid Anemia, Chronic Obstructive Pulmonary Disease (COPD), Weeks Of Treatment: 10 History: Hepatitis C, Received Chemotherapy Clustered Wound: No Photos Wound Measurements Length: (cm) 1.4 Width: (cm) 0.4 Depth: (cm) 0.1 Area: (cm) 0.44 Volume: (cm) 0.044 % Reduction in Area: 63.4% % Reduction in Volume: 63.3% Epithelialization: None Tunneling: No Undermining: No Wound Description Classification: Full Thickness Without Exposed Support Structures Exudate Amount: Small Exudate Type: Serous Exudate Color: amber Foul Odor After Cleansing: No Slough/Fibrino Yes Wound Bed Granulation Amount: Large (67-100%) Exposed Structure Granulation Quality: Red Fascia Exposed: No Necrotic Amount: None Present (0%) Fat Layer (Subcutaneous Tissue) Exposed: Yes Tendon Exposed: No Muscle Exposed: No Joint Exposed: No Bone Exposed: No Treatment Notes Wound #6 (Toe Second) Wound Laterality: Right Cleanser Normal Saline Discharge Instruction: Wash your hands with soap and water. Remove old dressing, discard into plastic bag and place  into trash. Cleanse the wound with Normal Saline prior to applying a clean dressing using gauze sponges, not tissues or cotton balls. Do not scrub or use excessive force. Pat dry using gauze sponges, not tissue or cotton balls. GOPAL, MALTER ([id number]) Peri-Wound Care Topical Primary Dressing Silvercel Small 2x2  (in/in) Quantity: 1 Discharge Instruction: Apply Silvercel Small 2x2 (in/in) as instructed Secondary Dressing Coverlet Latex-Free Fabric Adhesive Dressings Quantity: 1 Discharge Instruction: Secure just the wounded toe Secured With Compression Wrap Compression Stockings Add-Ons Electronic Signature(s) Signed: 10/26/2020 3:53:39 PM By: Georges Mouse, Minus Breeding RN Entered By: Georges Mouse, Minus Breeding on 10/26/2020 09:29:12 Chad [name] ([id number]) -------------------------------------------------------------------------------- Wound Assessment Details Patient Name: Chad [name] Date of Service: 10/26/2020 9:00 AM Medical Record Number: [id number] Patient Account Number: [id number] Date of Birth/Sex: [birth year] (60 y.o. M) Treating RN: Dolan Amen Primary Care Kaeleen Odom: Clayborn Bigness Other Clinician: Jeanine Luz Referring Rome Schlauch: Clayborn Bigness Treating Festus Pursel/Extender: Jeri Cos Weeks in Treatment: 10 Wound Status Wound Number: 8 Primary Venous Leg Ulcer Etiology: Wound Location: Left, Medial Ankle Wound Open Wounding Event: Gradually Appeared Status: Date Acquired: 07/12/2019 Comorbid Anemia, Chronic Obstructive Pulmonary Disease (COPD), Weeks Of Treatment: 10 History: Hepatitis C, Received Chemotherapy Clustered Wound: No Photos Wound Measurements Length: (cm) 4.9 Width: (cm) 3 Depth: (cm) 0.1 Area: (cm) 11.545 Volume: (cm) 1.155 % Reduction in Area: -96% % Reduction in Volume: -96.1% Epithelialization: Small (1-33%) Tunneling: No Undermining: No Wound Description Classification: Full Thickness Without Exposed Support Structures Wound Margin: Flat and Intact Exudate Amount: Large Exudate Type: Serous Exudate Color: amber Foul Odor After Cleansing: No Slough/Fibrino Yes Wound Bed Granulation Amount: Large (67-100%) Exposed Structure Granulation Quality: Red, Pink Fascia Exposed: No Necrotic Amount: Small (1-33%) Fat Layer (Subcutaneous Tissue)  Exposed: Yes Necrotic Quality: Adherent Slough Tendon Exposed: No Muscle Exposed: No Joint Exposed: No Bone Exposed: No Treatment Notes Wound #8 (Ankle) Wound Laterality: Left, Medial Cleanser Normal Saline Discharge Instruction: Wash your hands with soap and water. Remove old dressing, discard into plastic bag and place into trash. Cleanse the wound with Normal Saline prior to applying a clean dressing using gauze sponges, not tissues or cotton balls. Do not scrub or use excessive force. Pat dry using gauze sponges, not tissue or cotton balls. DAVONE, SHINAULT ([id number]) Peri-Wound Care Desitin Maximum Strength Ointment 4 (oz) Discharge Instruction: Zinc periwound Topical Primary Dressing Silvercel Small 2x2 (in/in) Quantity: 1 Discharge Instruction: Apply Silvercel Small 2x2 (in/in) as instructed Secondary Dressing ABD Pad 5x9 (in/in) Quantity: 1 Discharge Instruction: Cover with ABD pad Kerlix 4.5 x 4.1 (in/yd) Quantity: 1 Discharge Instruction: Apply Kerlix 4.5 x 4.1 (in/yd) as instructed Secured With 9M Medipore H Soft Cloth Surgical Tape, 2x2 (in/yd) Compression Wrap Compression Stockings Add-Ons Electronic Signature(s) Signed: 10/26/2020 3:53:39 PM By: Georges Mouse, Minus Breeding RN Entered By: Georges Mouse, Minus Breeding on 10/26/2020 09:29:50 Chad [name] ([id number]) -------------------------------------------------------------------------------- [place] Details Patient Name: Chad [name] Date of Service: 10/26/2020 9:00 AM Medical Record Number: [id number] Patient Account Number: [id number] Date of Birth/Sex: [birth year] (60 y.o. M) Treating RN: Dolan Amen Primary Care Jilliane Kazanjian: Clayborn Bigness Other Clinician: Jeanine Luz Referring Aziah Brostrom: Clayborn Bigness Treating Kawehi Hostetter/Extender: Skipper Cliche in Treatment: 10 Vital Signs Time Taken: 09:16 Temperature (F): 98.2 Height (in): 67 Pulse (bpm): 83 Weight (lbs): 160 Respiratory Rate (breaths/min):  16 Body Mass Index (BMI): 25.1 Blood Pressure (mmHg): 107/74 Reference Range: 80 - 120 mg / dl Electronic Signature(s) Signed: 10/26/2020 3:53:39 PM By: Georges Mouse, Minus Breeding RN Entered By: Georges Mouse, Minus Breeding on 10/26/2020 09:28:03

## 2020-10-29 NOTE — Assessment & Plan Note (Addendum)
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 JAN 5th, 2022- CT findings worrisome for progressive right renal pelvis and ureteral tumor and mass suspicious for colonic neoplasm involving the descending colon sigmoid colon junction region [see below].  Mild soft tissue thickening around the distal right ureter could reflect ureteral tumor; FEB 2022-  RIGHT renal pelvis and bladder are not evaluable by FDG PET imaging due to high activity of radiotracer within the urine. STABLE.   # proceed with  [place] today; Labs today reviewed; Labs today reviewed;  acceptable for treatment today.   #Descending colon mass/intermittent constipation-PET scan- FEB 2022-uptake in the cecum;  [history of Lynch syndrome].  Canceled- sec to blood thinner- Awaiting colonoscopy on  4/12  # Iron deficiency anemia-hemoglobin 11.5; February 2022 ron studies/ferritin-LOW; plan Venofer with infusions;  STABLE.   # Bilateral LE ulcers-s/p wound care evaluation; s/p PTCA with Dr.Dew. bil Arterial Dopp-wnl; STABLE [Dr.]  #DISPOSITION: #  keytruda/venofer today;  # follow up in 3 weeks-;MD labs-cbc/cmp;  Keytruda; venofer- Dr.B

## 2020-11-02 ENCOUNTER — Other Ambulatory Visit: Admission: RE | Admit: 2020-11-02 | Payer: Medicaid Other | Source: Ambulatory Visit

## 2020-11-02 ENCOUNTER — Other Ambulatory Visit: Payer: Self-pay

## 2020-11-02 ENCOUNTER — Encounter: Payer: Medicaid Other | Admitting: Physician Assistant

## 2020-11-02 DIAGNOSIS — L97322 Non-pressure chronic ulcer of left ankle with fat layer exposed: Secondary | ICD-10-CM | POA: Diagnosis not present

## 2020-11-02 NOTE — Progress Notes (Addendum)
[name], [name] ([id number]) Visit Report for 11/02/2020 Chief Complaint Document Details Patient Name: Chad [name], Chad [name] Date of Service: 11/02/2020 10:15 AM Medical Record Number: [id number] Patient Account Number: [id number] Date of Birth/Sex: 1960-08-18 (60 y.o. M) Treating RN: Carlene Coria Primary Care Provider: Clayborn Bigness Other Clinician: Jeanine Luz Referring Provider: Clayborn Bigness Treating Provider/Extender: Skipper Cliche in Treatment: 11 Information Obtained from: Patient Chief Complaint Left ankle and right foot and heel ulcers Electronic Signature(s) Signed: 11/02/2020 9:50:57 AM By: Worthy Keeler PA-C Entered By: Worthy Keeler on 11/02/2020 09:50:56 Chad [name] ([id number]) -------------------------------------------------------------------------------- Debridement Details Patient Name: Chad [name] Date of Service: 11/02/2020 10:15 AM Medical Record Number: [id number] Patient Account Number: [id number] Date of Birth/Sex: 1961-04-08 (60 y.o. M) Treating RN: Carlene Coria Primary Care Provider: Clayborn Bigness Other Clinician: Jeanine Luz Referring Provider: Clayborn Bigness Treating Provider/Extender: Skipper Cliche in Treatment: 11 Debridement Performed for Wound #6 Right Toe Second Assessment: Performed By: Physician Tommie Sams., PA-C Debridement Type: Debridement Level of Consciousness (Pre- Awake and Alert procedure): Pre-procedure Verification/Time Out Yes - 10:07 Taken: Start Time: 10:07 Total Area Debrided (L x W): 1 (cm) x 0.2 (cm) = 0.2 (cm) Tissue and other material Eschar, Skin: Dermis , Skin: Epidermis debrided: Level: Skin/Epidermis Debridement Description: Selective/Open Wound Instrument: Curette Bleeding: Minimum Hemostasis Achieved: Pressure End Time: 10:11 Procedural Pain: 0 Post Procedural Pain: 0 Response to Treatment: Procedure was tolerated well Level of Consciousness (Post- Awake and Alert procedure): Post Debridement  Measurements of Total Wound Length: (cm) 0.5 Width: (cm) 0.2 Depth: (cm) 0.1 Volume: (cm) 0.008 Character of Wound/Ulcer Post Debridement: Improved Post Procedure Diagnosis Same as Pre-procedure Electronic Signature(s) Signed: 11/02/2020 5:24:00 PM By: Worthy Keeler PA-C Signed: 11/05/2020 7:55:29 AM By: Carlene Coria RN Entered By: Carlene Coria on 11/02/2020 10:09:58 Chad [name] ([id number]) -------------------------------------------------------------------------------- Debridement Details Patient Name: Chad [name] Date of Service: 11/02/2020 10:15 AM Medical Record Number: [id number] Patient Account Number: [id number] Date of Birth/Sex: 27-Apr-1961 (60 y.o. M) Treating RN: Carlene Coria Primary Care Provider: Clayborn Bigness Other Clinician: Jeanine Luz Referring Provider: Clayborn Bigness Treating Provider/Extender: Skipper Cliche in Treatment: 11 Debridement Performed for Wound #8 Left,Medial Ankle Assessment: Performed By: Physician Tommie Sams., PA-C Debridement Type: Debridement Severity of Tissue Pre Debridement: Fat layer exposed Level of Consciousness (Pre- Awake and Alert procedure): Pre-procedure Verification/Time Out Yes - 10:07 Taken: Start Time: 10:07 Pain Control: Lidocaine 4% Topical Solution Total Area Debrided (L x W): 4.9 (cm) x 2.7 (cm) = 13.23 (cm) Tissue and other material Eschar, Slough, Subcutaneous, Slough, Other: biofilm debrided: Level: Skin/Subcutaneous Tissue Debridement Description: Excisional Instrument: Curette Bleeding: Minimum Hemostasis Achieved: Pressure End Time: 10:11 Procedural Pain: 0 Post Procedural Pain: 0 Response to Treatment: Procedure was tolerated well Level of Consciousness (Post- Awake and Alert procedure): Post Debridement Measurements of Total Wound Length: (cm) 4.9 Width: (cm) 2.7 Depth: (cm) 0.1 Volume: (cm) 1.039 Character of Wound/Ulcer Post Debridement: Improved Severity of Tissue Post  Debridement: Fat layer exposed Post Procedure Diagnosis Same as Pre-procedure Electronic Signature(s) Signed: 11/02/2020 5:24:00 PM By: Worthy Keeler PA-C Signed: 11/05/2020 7:55:29 AM By: Carlene Coria RN Entered By: Carlene Coria on 11/02/2020 10:11:01 Chad [name] ([id number]) -------------------------------------------------------------------------------- HPI Details Patient Name: Chad [name] Date of Service: 11/02/2020 10:15 AM Medical Record Number: [id number] Patient Account Number: [id number] Date of Birth/Sex: 1961/03/08 (60 y.o. M) Treating RN: Carlene Coria Primary Care Provider: Clayborn Bigness Other Clinician: Jeanine Luz Referring Provider: Clayborn Bigness Treating Provider/Extender: Skipper Cliche in Treatment: 11 History of Present  Illness HPI Description: 10/08/18 on evaluation today patient actually presents to our office for initial evaluation concerning wounds that he has of the bilateral lower extremities. 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 He also has bilateral venous stasis. He apparently has been homeless currently following discharge from the hospital apparently he has been placed at [facility] which is is a skilled nursing facility locally. Nonetheless fortunately he does not show any signs of infection at this time which is good news. In fact several of the wound actually appears to be showing some signs of improvement already in my pinion. There are a couple areas in the left leg in particular there likely gonna require some sharp debridement to help clear away some necrotic tissue and help with more sufficient healing. No fevers, chills, nausea, or vomiting noted at this time. 10/15/18 on evaluation today patient actually appears to be doing very well in regard to his bilateral lower  extremities. He's been tolerating the dressing changes without complication. Fortunately there does not appear to be any evidence of active infection at this time which is great news. Overall I'm actually very pleased with how this has progressed in just one visits time. Readmission: 08/14/2020 upon evaluation today patient presents for re-evaluation here in our clinic. He is having issues with his left ankle region as well as his right toe and his right heel. He tells me that the toe and heel actually began as a area that was itching that he was scratching and then subsequently opened up into wounds. These may have been abscess areas I presume based on what I am seeing currently. With regard to his left ankle region he tells me this was a similar type occurrence although he does have venous stasis this very well may be more of a venous leg ulcer more than anything. Nonetheless I do believe that the patient would benefit from appropriate and aggressive wound care to try to help get things under better control here. He does have history of a stroke on the left side affecting him to some degree there that he is able to stand although he does have some residual weakness. Otherwise again the patient does have chronic venous insufficiency as previously noted. His arterial studies most recently obtained showed that he had an ABI on the right of 1.16 with a TBI of 0.52 and on the left and ABI of 1.14 with a TBI of 0.81. That was obtained on 06/19/2020. 08/28/2020 upon evaluation today patient appears to be doing decently well in regard to his wounds in general. He has been tolerating the dressing changes without complication. Fortunately there does not appear to be any signs of active infection which is great news. With that being said I think the [facility] is doing a good job I would recommend that we likely continue with that currently. 09/11/2020 upon evaluation today patient's wounds did not appear to  be doing too poorly but again he is not really showing signs of significant improvement with regard to any of the wounds on the right. None of them have Hydrofera Blue on them I am not exactly sure why this is not being followed as the facility did not contact us to let us know of any issues with obtaining dressings or otherwise. With that being said he is supposed to be using [facility]  on both of the wounds on the right foot as well as the ankle wound on the left side. 09/18/2020 upon evaluation today patient appears to be doing poorly with regard to his wounds. Again right now the left ankle in particular showed signs of extreme maceration. Apparently he was told by someone with staff at [place] they could not get the [facility] With that being said this is something that is never been relayed to [place] one way or another. Also the patient subsequently has not supposed to have a border gauze dressing on. He should have an ABD pad and roll gauze to secure as this drains much too much just to have a border gauze dressing to cover. Nonetheless the fact that they are not using the appropriate dressing is directly causing deterioration of the left ankle wound it is significantly worse today compared to what it was previous. I did attempt to call [facility] while the patient was here I called three times and got no one to even pick up the phone. After this I had my for an office coordinator call and she was able to finally get through and leave a message with the D ON as of dictation of this note which is roughly about an hour and a half later I still have not been able to speak with anyone at the facility. 09/25/2020 upon evaluation today patient actually showing signs of good improvement which is excellent news. He has been tolerating the dressing changes without complication. Fortunately there is no signs of active infection which is great news. No fevers, chills, nausea,  vomiting, or diarrhea. I do feel like the facility has been doing a much better job at taking care of him as far as the dressings are concerned. However the director of nursing never did call me back. 10/09/2020 upon evaluation today patient appears to be doing well with regard to his wound. The toe ulcer did require some debridement but the other 2 areas actually appear to be doing quite well. 10/19/2020 upon evaluation today patient actually appears to be doing very well in regard to his wounds. In fact the heel does appear to be completely healed. The toe is doing better in the medial ankle on the left is also doing better. Overall I think he is headed in the right direction. 10/26/2020 upon evaluation today patient appears to be doing well with regard to his wound. He is showing signs of improvement which is great news and overall I am very pleased with where things stand today. No fevers, chills, nausea, vomiting, or diarrhea. 11/02/2020 upon evaluation today patient appears to be doing well with regard to his wounds. He has been tolerating the dressing changes without complication overall I am extremely pleased with where things stand today. He in regard to the toe is almost completely healed and the medial ankle on the left is doing much better. Electronic Signature(s) YOSMAR, RYKER ([id number]) Signed: 11/02/2020 5:15:14 PM By: Worthy Keeler PA-C Entered By: Worthy Keeler on 11/02/2020 17:15:14 Chad [name] ([id number]) -------------------------------------------------------------------------------- Physical Exam Details Patient Name: Chad [name] Date of Service: 11/02/2020 10:15 AM Medical Record Number: [id number] Patient Account Number: [id number] Date of Birth/Sex: 02-May-1961 (60 y.o. M) Treating RN: Carlene Coria Primary Care Provider: Clayborn Bigness Other Clinician: Jeanine Luz Referring Provider: Clayborn Bigness Treating Provider/Extender: Skipper Cliche in Treatment:  60 Constitutional Well-nourished and well-hydrated in no acute distress. Respiratory normal breathing without difficulty. Psychiatric this patient is able to make decisions  and demonstrates good insight into disease process. Alert and Oriented x 3. pleasant and cooperative. Notes Upon inspection patient's wound bed showed signs of good granulation epithelization at this point. I do not see any evidence whatsoever of infection and I think he is actually managing quite nicely. The patient is extremely pleased to hear things are doing so well. Electronic Signature(s) Signed: 11/02/2020 5:15:29 PM By: Worthy Keeler PA-C Entered By: Worthy Keeler on 11/02/2020 17:15:29 Chad [name] ([id number]) -------------------------------------------------------------------------------- Physician Orders Details Patient Name: Chad [name] Date of Service: 11/02/2020 10:15 AM Medical Record Number: [id number] Patient Account Number: [id number] Date of Birth/Sex: 02-07-1961 (60 y.o. 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 Wound Treatment Wound #6 - Toe Second Wound Laterality: Right Cleanser: Normal Saline 1 x Per Day/30 Days Discharge Instructions: Wash your hands with soap and water. Remove old dressing, discard into plastic bag  and place into trash. Cleanse the wound with Normal Saline prior to applying a clean dressing using gauze sponges, not tissues or cotton balls. Do not scrub or use excessive force. Pat dry using gauze sponges, not tissue or cotton balls. Primary Dressing: Silvercel Small 2x2 (in/in) (Generic) 1 x Per Day/30 Days Discharge Instructions: Apply Silvercel Small 2x2 (in/in) as instructed Secondary Dressing: Coverlet Latex-Free Fabric Adhesive Dressings 1 x Per Day/30 Days Discharge Instructions: Secure just the wounded toe Wound #8 - Ankle Wound Laterality: Left, Medial Cleanser: Normal Saline 1 x Per Day/30 Days Discharge Instructions: Wash your hands with soap and water. Remove old dressing, discard into plastic bag and place into trash. Cleanse the wound with Normal Saline prior to applying a clean dressing using gauze sponges, not tissues or cotton balls. Do not scrub or use excessive force. Pat dry using gauze sponges, not tissue or cotton balls. Peri-Wound Care: Desitin Maximum Strength Ointment 4 (oz) 1 x Per Day/30 Days Discharge Instructions: Zinc periwound Primary Dressing: Silvercel Small 2x2 (in/in) 1 x Per Day/30 Days Discharge Instructions: Apply Silvercel Small 2x2 (in/in) as instructed Secondary Dressing: ABD Pad 5x9 (in/in) 1 x Per Day/30 Days Discharge Instructions: Cover with ABD pad Secondary Dressing: Kerlix 4.5 x 4.1 (in/yd) 1 x Per Day/30 Days Discharge Instructions: Apply Kerlix 4.5 x 4.1 (in/yd) as instructed Secured With: 79M Medipore H Soft Cloth Surgical Tape, 2x2 (in/yd) 1 x Per Day/30 Days Electronic Signature(s) Signed: 11/02/2020 5:24:00 PM By: Worthy Keeler PA-C Signed: 11/05/2020 7:55:29 AM By: Carlene Coria RN Entered By: Carlene Coria on 11/02/2020 10:11:52 Chad [name] ([id number]) -------------------------------------------------------------------------------- Problem List Details Patient Name: Chad [name] Date of Service: 11/02/2020 10:15 AM Medical  Record Number: [id number] Patient Account Number: [id number] Date of Birth/Sex: 07/10/1961 (60 y.o. M) Treating RN: Carlene Coria Primary Care Provider: Clayborn Bigness Other Clinician: Jeanine Luz Referring Provider: Clayborn Bigness Treating Provider/Extender: Skipper Cliche in Treatment: 11 Active Problems ICD-10 Encounter Code Description Active Date MDM Diagnosis I87.2 Venous insufficiency (chronic) (peripheral) 08/14/2020 No Yes L97.322 Non-pressure chronic ulcer of left ankle with  fat layer exposed 08/14/2020 No Yes L97.412 Non-pressure chronic ulcer of right heel and midfoot with fat layer 08/14/2020 No Yes exposed L97.512 Non-pressure chronic ulcer of other part of right foot with fat layer 08/14/2020 No Yes exposed 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 M) Treating RN: Carlene Coria Primary Care Provider: Clayborn Bigness Other Clinician: Jeanine Luz Referring Provider: Clayborn Bigness Treating Provider/Extender: Skipper Cliche in Treatment: 11 Subjective Chief Complaint Information obtained from Patient Left ankle and right foot and heel ulcers History of Present Illness (HPI) 10/08/18 on evaluation today patient actually presents to our office for initial evaluation concerning wounds that he has of the bilateral lower extremities. He has no history of known diabetes, he does have hepatitis C, urinary tract cancer for which she receives  infusions not chemotherapy, and the history of the left-sided stroke with residual weakness. He also has bilateral venous stasis. He apparently has been homeless currently following discharge from the hospital apparently he has been placed at [facility] which is is a skilled nursing facility locally. Nonetheless fortunately he does not show any signs of infection at this time which is good news. In fact several of the wound actually appears to be showing some signs of improvement already in my pinion. There are a couple areas in the left leg in particular there likely gonna require some sharp debridement to help clear away some necrotic tissue and help with more sufficient healing. No fevers, chills, nausea, or vomiting noted at this time. 10/15/18 on evaluation today patient actually appears to be doing very well in regard to his bilateral lower extremities. He's been tolerating the dressing changes without complication. Fortunately there does not appear to be any evidence of active infection at this time which is great news. Overall I'm actually very pleased with how this has progressed in just one visits time. Readmission: 08/14/2020 upon evaluation today patient presents for re-evaluation here in our clinic. He is having issues with his left ankle region as well as his right toe and his right heel. He tells me that the toe and heel actually began as a area that was itching that he was scratching and then subsequently opened up into wounds. These may have been abscess areas I presume based on what I am seeing currently. With regard to his left ankle region he tells me this was a similar type occurrence although he does have venous stasis this very well may be more of a venous leg ulcer more than anything. Nonetheless I do believe that the patient would benefit from appropriate and aggressive wound care to try to help get things under better control here. He does have history of a stroke on  the left side affecting him to some degree there that he is able to stand although he does have some residual weakness. Otherwise again the patient does have chronic venous insufficiency as previously noted. His arterial studies most recently obtained showed that he had an ABI on the right of 1.16 with a TBI of 0.52 and on the left and ABI of 1.14 with a TBI of 0.81. That was obtained on 06/19/2020. 08/28/2020 upon  evaluation today patient appears to be doing decently well in regard to his wounds in general. He has been tolerating the dressing changes without complication. Fortunately there does not appear to be any signs of active infection which is great news. With that being said I think the [facility] is doing a good job I would recommend that we likely continue with that currently. 09/11/2020 upon evaluation today patient's wounds did not appear to be doing too poorly but again he is not really showing signs of significant improvement with regard to any of the wounds on the right. None of them have Hydrofera Blue on them I am not exactly sure why this is not being followed as the facility did not contact us to let us know of any issues with obtaining dressings or otherwise. With that being said he is supposed to be using Hydrofera Blue on both of the wounds on the right foot as well as the ankle wound on the left side. 09/18/2020 upon evaluation today patient appears to be doing poorly with regard to his wounds. Again right now the left ankle in particular showed signs of extreme maceration. Apparently he was told by someone with staff at [place] they could not get the [facility]. With that being said this is something that is never been relayed to [place] one way or another. Also the patient subsequently has not supposed to have a border gauze dressing on. He should have an ABD pad and roll gauze to secure as this drains much too much just to have a border gauze dressing to cover.  Nonetheless the fact that they are not using the appropriate dressing is directly causing deterioration of the left ankle wound it is significantly worse today compared to what it was previous. I did attempt to call [facility] while the patient was here I called three times and got no one to even pick up the phone. After this I had my for an office coordinator call and she was able to finally get through and leave a message with the D ON as of dictation of this note which is roughly about an hour and a half later I still have not been able to speak with anyone at the facility. 09/25/2020 upon evaluation today patient actually showing signs of good improvement which is excellent news. He has been tolerating the dressing changes without complication. Fortunately there is no signs of active infection which is great news. No fevers, chills, nausea, vomiting, or diarrhea. I do feel like the facility has been doing a much better job at taking care of him as far as the dressings are concerned. However the director of nursing never did call me back. 10/09/2020 upon evaluation today patient appears to be doing well with regard to his wound. The toe ulcer did require some debridement but the other 2 areas actually appear to be doing quite well. 10/19/2020 upon evaluation today patient actually appears to be doing very well in regard to his wounds. In fact the heel does appear to be completely healed. The toe is doing better in the medial ankle on the left is also doing better. Overall I think he is headed in the right direction. 10/26/2020 upon evaluation today patient appears to be doing well with regard to his wound. He is showing signs of improvement which is great news and overall I am very pleased with where things stand today. No fevers, chills, nausea, vomiting, or diarrhea. 11/02/2020 upon evaluation today patient appears  to be doing well with regard to his wounds. He has been tolerating the dressing  changes without complication overall I am extremely pleased with where things stand today. He in regard to the toe is almost completely healed and the medial [facility], Chad [name] ([id number]) ankle on the left is doing much better. Objective Constitutional Well-nourished and well-hydrated in no acute distress. Vitals Time Taken: 9:37 AM, Height: 67 in, Weight: 160 lbs, BMI: 25.1, Temperature: 98.1 F, Pulse: 91 bpm, Respiratory Rate: 18 breaths/min, Blood Pressure: 123/75 mmHg. Respiratory normal breathing without difficulty. Psychiatric this patient is able to make decisions and demonstrates good insight into disease process. Alert and Oriented x 3. pleasant and cooperative. General Notes: Upon inspection patient's wound bed showed signs of good granulation epithelization at this point. I do not see any evidence whatsoever of infection and I think he is actually managing quite nicely. The patient is extremely pleased to hear things are doing so well. Integumentary (Hair, Skin) Wound #6 status is Open. Original cause of wound was Gradually Appeared. The date acquired was: 03/28/2020. The wound has been in treatment 11 weeks. The wound is located on the Right Toe Second. The wound measures 1cm length x 0.2cm width x 0.1cm depth; 0.157cm^2 area and 0.016cm^3 volume. There is Fat Layer (Subcutaneous Tissue) exposed. There is no tunneling or undermining noted. There is a small amount of serous drainage noted. There is small (1-33%) red granulation within the wound bed. There is a large (67-100%) amount of necrotic tissue within the wound bed including Eschar. Wound #8 status is Open. Original cause of wound was Gradually Appeared. The date acquired was: 07/12/2019. The wound has been in treatment 11 weeks. The wound is located on the Left,Medial Ankle. The wound measures 4.9cm length x 2.7cm width x 0.1cm depth; 10.391cm^2 area and 1.039cm^3 volume. There is Fat Layer (Subcutaneous Tissue) exposed.  There is no tunneling or undermining noted. There is a large amount of serous drainage noted. The wound margin is flat and intact. There is small (1-33%) red, pink granulation within the wound bed. There is a large (67-100%) amount of necrotic tissue within the wound bed including Adherent Slough. 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 There was a Selective/Open Wound Skin/Epidermis Debridement with a total area of 0.2 sq cm performed by Tommie Sams., PA-C. With the following instrument(s): Curette Material removed includes Eschar, Skin: Dermis, and Skin: Epidermis. No specimens were taken. A time out was conducted at 10:07, prior to the start of the procedure. A Minimum amount of bleeding was controlled with Pressure. The procedure was tolerated well with a pain level of 0 throughout and a pain level of 0 following the procedure. Post Debridement Measurements: 0.5cm length x 0.2cm width x 0.1cm depth; 0.008cm^3 volume. Character of Wound/Ulcer Post Debridement is improved. Post procedure Diagnosis Wound #6: Same as Pre-Procedure Wound #8 Pre-procedure diagnosis of Wound #8 is a Venous Leg Ulcer located on the Left,Medial Ankle .Severity of Tissue Pre Debridement is: Fat layer CODEE, TUTSON ([id number]) exposed. There was a Excisional Skin/Subcutaneous Tissue Debridement with a total area of 13.23 sq cm performed by Joaquim Lai,  Shirlee Latch., PA-C. With the following instrument(s): Curette Material removed includes Eschar, Subcutaneous Tissue, Slough, and  Other: biofilm after achieving pain control using Lidocaine 4% Topical Solution. No specimens were taken. A time out was conducted at 10:07, prior to the start of the procedure. A Minimum amount of bleeding was controlled with Pressure. The procedure was tolerated well with a pain level of 0 throughout and a pain level of 0 following the procedure. Post Debridement Measurements: 4.9cm length x 2.7cm width x 0.1cm depth; 1.039cm^3 volume. Character of Wound/Ulcer Post Debridement is improved. Severity of Tissue Post Debridement is: Fat layer exposed. Post procedure Diagnosis Wound #8: Same as Pre-Procedure Plan Follow-up Appointments: Return Appointment in 1 week. WOUND #6: - Toe Second Wound Laterality: Right Cleanser: Normal Saline 1 x Per Day/30 Days Discharge Instructions: Wash your hands with soap and water. Remove old dressing, discard into plastic bag and place into trash. Cleanse the wound with Normal Saline prior to applying a clean dressing using gauze sponges, not tissues or cotton balls. Do not scrub or use excessive force. Pat dry using gauze sponges, not tissue or cotton balls. Primary Dressing: Silvercel Small 2x2 (in/in) (Generic) 1 x Per Day/30 Days Discharge Instructions: Apply Silvercel Small 2x2 (in/in) as instructed Secondary Dressing: Coverlet Latex-Free Fabric Adhesive Dressings 1 x Per Day/30 Days Discharge Instructions: Secure just the wounded toe WOUND #8: - Ankle Wound Laterality: Left, Medial Cleanser: Normal Saline 1 x Per Day/30 Days Discharge Instructions: Wash your hands with soap and water. Remove old dressing, discard into plastic bag and place into trash. Cleanse the wound with Normal Saline prior to applying a clean dressing using gauze sponges, not tissues or cotton balls. Do not scrub or use excessive force. Pat dry using gauze sponges, not tissue or cotton balls. Peri-Wound Care: Desitin Maximum Strength Ointment 4 (oz) 1 x Per Day/30 Days Discharge  Instructions: Zinc periwound Primary Dressing: Silvercel Small 2x2 (in/in) 1 x Per Day/30 Days Discharge Instructions: Apply Silvercel Small 2x2 (in/in) as instructed Secondary Dressing: ABD Pad 5x9 (in/in) 1 x Per Day/30 Days Discharge Instructions: Cover with ABD pad Secondary Dressing: Kerlix 4.5 x 4.1 (in/yd) 1 x Per Day/30 Days Discharge Instructions: Apply Kerlix 4.5 x 4.1 (in/yd) as instructed Secured With: 74M Medipore H Soft Cloth Surgical Tape, 2x2 (in/yd) 1 x Per Day/30 Days 1. Would recommend currently that we continue with the wound care measures as before this includes the use of the silver alginate dressing which I think is done a great job of both wound locations. 2. We are using some Desitin around the edges of the medial ankle area to protect the good skin. Cover this with an ABD pad and roll gauze. We will see patient back for reevaluation in 1 week here in the clinic. If anything worsens or changes patient will contact our office for additional recommendations. Electronic Signature(s) Signed: 11/02/2020 5:15:52 PM By: Worthy Keeler PA-C Entered By: Worthy Keeler on 11/02/2020 17:15:52 Chad [name] ([id number]) -------------------------------------------------------------------------------- SuperBill Details Patient Name: Chad [name] Date of Service: 11/02/2020 Medical Record Number: [id number] Patient Account Number: [id number] Date of Birth/Sex: [birth year] (60 y.o. M) Treating RN: Carlene Coria Primary Care Provider: Clayborn Bigness Other Clinician: Jeanine Luz Referring Provider: Clayborn Bigness Treating Provider/Extender: Skipper Cliche in Treatment: 11 Diagnosis Coding ICD-10 Codes Code Description I87.2 Venous insufficiency (chronic) (peripheral) L97.322 Non-pressure chronic ulcer of left ankle with fat layer exposed L97.412 Non-pressure chronic ulcer of right heel and midfoot with fat layer exposed L97.512 Non-pressure  chronic ulcer of other part of  right foot with fat layer exposed 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 side Facility Procedures CPT4 Code: 93112162 Description: 44695 - DEB SUBQ TISSUE 20 SQ CM/< Modifier: Quantity: 1 CPT4 Code: Description: ICD-10 Diagnosis Description Q72.257 Non-pressure chronic ulcer of left ankle with fat layer exposed Modifier: Quantity: CPT4 Code: [id number] Description: 82518 - DEBRIDE WOUND 1ST 20 SQ CM OR < Modifier: Quantity: 1 CPT4 Code: Description: ICD-10 Diagnosis Description L97.512 Non-pressure chronic ulcer of other part of right foot with fat layer exp Modifier: osed Quantity: Physician Procedures CPT4 Code: 9842103 Description: 11042 - WC PHYS SUBQ TISS 20 SQ CM Modifier: Quantity: 1 CPT4 Code: Description: ICD-10 Diagnosis Description X28.118 Non-pressure chronic ulcer of left ankle with fat layer exposed Modifier: Quantity: CPT4 Code: [id number] Description: 66815 - WC PHYS DEBR WO ANESTH 20 SQ CM Modifier: Quantity: 1 CPT4 Code: Description: ICD-10 Diagnosis Description L97.512 Non-pressure chronic ulcer of other part of right foot with fat layer exp Modifier: osed Quantity: Electronic Signature(s) Signed: 11/02/2020 5:16:18 PM By: Worthy Keeler PA-C Entered By: Worthy Keeler on 11/02/2020 17:16:18

## 2020-11-05 NOTE — Progress Notes (Signed)
CHESTER, SIBERT ([id number]) Visit Report for 11/02/2020 Arrival Information Details Patient Name: Chad [name], Chad [name] Date of Service: 11/02/2020 10:15 AM Medical Record Number: [id number] Patient Account Number: [id number] Date of Birth/Sex: 10-17-1960 (60 y.o. M) Treating RN: Donnamarie Poag Primary Care Britiney Blahnik: Clayborn Bigness Other Clinician: Jeanine Luz Referring Everlee Quakenbush: Clayborn Bigness Treating Seneca Gadbois/Extender: Skipper Cliche in Treatment: 11 Visit Information History Since Last Visit Added or deleted any medications: No Patient Arrived: Wheel Chair Had a fall or experienced change in No Arrival Time: 09:37 activities of daily living that may affect Accompanied By: self risk of falls: Transfer Assistance: EasyPivot Patient Lift Hospitalized since last visit: No Patient Requires Transmission-Based No Has Dressing in Place as Prescribed: Yes Precautions: Pain Present Now: Yes Patient Has Alerts: Yes Patient Alerts: Patient on Blood Thinner ABI right 1.16 ABI left 1.14 Electronic Signature(s) Signed: 11/02/2020 4:43:09 PM By: Donnamarie Poag Entered By: Donnamarie Poag on 11/02/2020 09:40:51 Chad [name] ([id number]) -------------------------------------------------------------------------------- Clinic Level of Care Assessment Details Patient Name: Chad [name] Date of Service: 11/02/2020 10:15 AM Medical Record Number: [id number] Patient Account Number: [id number] Date of Birth/Sex: 1961-07-20 (60 y.o. M) Treating RN: Carlene Coria Primary Care Tarryn Bogdan: Clayborn Bigness Other Clinician: Jeanine Luz Referring Mackayla Mullins: Clayborn Bigness Treating Sashia Campas/Extender: Skipper Cliche in Treatment: 11 Clinic Level of Care Assessment Items TOOL 1 Quantity Score []  - Use when EandM and Procedure is performed on INITIAL visit 0 ASSESSMENTS - Nursing Assessment / Reassessment []  - General Physical Exam (combine w/ comprehensive assessment (listed just below) when performed on new 0 pt.  evals) []  - 0 Comprehensive Assessment (HX, ROS, Risk Assessments, Wounds Hx, etc.) ASSESSMENTS - Wound and Skin Assessment / Reassessment []  - Dermatologic / Skin Assessment (not related to wound area) 0 ASSESSMENTS - Ostomy and/or Continence Assessment and Care []  - Incontinence Assessment and Management 0 []  - 0 Ostomy Care Assessment and Management (repouching, etc.) PROCESS - Coordination of Care []  - Simple Patient / Family Education for ongoing care 0 []  - 0 Complex (extensive) Patient / Family Education for ongoing care []  - 0 Staff obtains Programmer, systems, Records, Test Results / Process Orders []  - 0 Staff telephones HHA, Nursing Homes / Clarify orders / etc []  - 0 Routine Transfer to another Facility (non-emergent condition) []  - 0 Routine Hospital Admission (non-emergent condition) []  - 0 New Admissions / Biomedical engineer / Ordering NPWT, Apligraf, etc. []  - 0 Emergency Hospital Admission (emergent condition) PROCESS - Special Needs []  - Pediatric / Minor Patient Management 0 []  - 0 Isolation Patient Management []  - 0 Hearing / Language / Visual special needs []  - 0 Assessment of Community assistance (transportation, D/C planning, etc.) []  - 0 Additional assistance / Altered mentation []  - 0 Support Surface(s) Assessment (bed, cushion, seat, etc.) INTERVENTIONS - Miscellaneous []  - External ear exam 0 []  - 0 Patient Transfer (multiple staff / Civil Service fast streamer / Similar devices) []  - 0 Simple Staple / Suture removal (25 or less) []  - 0 Complex Staple / Suture removal (26 or more) []  - 0 Hypo/Hyperglycemic Management (do not check if billed separately) []  - 0 Ankle / Brachial Index (ABI) - do not check if billed separately Has the patient been seen at the hospital within the last three years: Yes Total Score: 0 Level Of Care: ____ Chad [name] ([id number]) Electronic Signature(s) Signed: 11/05/2020 7:55:29 AM By: Carlene Coria RN Entered By: Carlene Coria  on 11/02/2020 10:12:02 Chad [name] ([id number]) -------------------------------------------------------------------------------- Encounter Discharge Information Details Patient Name: Chad [name] Date of Service:  11/02/2020 10:15 AM Medical Record Number: [id number] Patient Account Number: [id number] Date of Birth/Sex: 04-01-1961 (60 y.o. M) Treating RN: Carlene Coria Primary Care Santo Zahradnik: Clayborn Bigness Other Clinician: Jeanine Luz Referring Crescencio Jozwiak: Clayborn Bigness Treating Altheria Shadoan/Extender: Skipper Cliche in Treatment: 11 Encounter Discharge Information Items Post Procedure Vitals Discharge Condition: Stable Temperature (F): 98.1 Ambulatory Status: Wheelchair Pulse (bpm): 91 Discharge Destination: Home Respiratory Rate (breaths/min): 18 Transportation: Other Blood Pressure (mmHg): 123/75 Accompanied By: self Schedule Follow-up Appointment: Yes Clinical Summary of Care: Electronic Signature(s) Signed: 11/02/2020 2:20:27 PM By: Jeanine Luz Previous Signature: 11/02/2020 12:09:11 PM Version By: Jeanine Luz Entered By: Jeanine Luz on 11/02/2020 14:20:27 Chad [name] ([id number]) -------------------------------------------------------------------------------- Lower Extremity Assessment Details Patient Name: Chad [name] Date of Service: 11/02/2020 10:15 AM Medical Record Number: [id number] Patient Account Number: [id number] Date of Birth/Sex: 07/06/1961 (60 y.o. M) Treating RN: Donnamarie Poag Primary Care Loucile Posner: Clayborn Bigness Other Clinician: Jeanine Luz Referring Carson Meche: Clayborn Bigness Treating Felesia Stahlecker/Extender: Jeri Cos Weeks in Treatment: 11 Edema Assessment Assessed: [Left: Yes] [Right: Yes] Edema: [Left: No] [Right: No] Vascular Assessment Pulses: Dorsalis Pedis Palpable: [Left:Yes] [Right:Yes] Electronic Signature(s) Signed: 11/02/2020 4:43:09 PM By: Donnamarie Poag Entered By: Donnamarie Poag on 11/02/2020 09:46:25 Chad [name]  ([id number]) -------------------------------------------------------------------------------- Multi Wound Chart Details Patient Name: Chad [name] Date of Service: 11/02/2020 10:15 AM Medical Record Number: [id number] Patient Account Number: [id number] Date of Birth/Sex: 03/01/1961 (60 y.o. M) Treating RN: Carlene Coria Primary Care Shaniqua Guillot: Clayborn Bigness Other Clinician: Jeanine Luz Referring Sanaii Caporaso: Clayborn Bigness Treating Bogdan Vivona/Extender: Skipper Cliche in Treatment: 11 Vital Signs Height(in): 50 Pulse(bpm): 40 Weight(lbs): 160 Blood Pressure(mmHg): 123/75 Body Mass Index(BMI): 25 Temperature(F): 98.1 Respiratory Rate(breaths/min): 18 Photos: [N/A:N/A] Wound Location: Right Toe Second Left, Medial Ankle N/A Wounding Event: Gradually Appeared Gradually Appeared N/A Primary Etiology: Abscess Venous Leg Ulcer N/A Comorbid History: Anemia, Chronic Obstructive Anemia, Chronic Obstructive N/A Pulmonary Disease (COPD), Pulmonary Disease (COPD), Hepatitis C, Received Hepatitis C, Received Chemotherapy Chemotherapy Date Acquired: 03/28/2020 07/12/2019 N/A Weeks of Treatment: 11 11 N/A Wound Status: Open Open N/A Measurements L x W x D (cm) 1x0.2x0.1 4.9x2.7x0.1 N/A Area (cm) : 0.157 10.391 N/A Volume (cm) : 0.016 1.039 N/A % Reduction in Area: 86.90% -76.40% N/A % Reduction in Volume: 86.70% -76.40% N/A Classification: Full Thickness Without Exposed Full Thickness Without Exposed N/A Support Structures Support Structures Exudate Amount: Small Large N/A Exudate Type: Serous Serous N/A Exudate Color: amber amber N/A Wound Margin: N/A Flat and Intact N/A Granulation Amount: Small (1-33%) Small (1-33%) N/A Granulation Quality: Red Red, Pink N/A Necrotic Amount: Large (67-100%) Large (67-100%) N/A Necrotic Tissue: [place] N/A Exposed Structures: Fat Layer (Subcutaneous Tissue): Fat Layer (Subcutaneous Tissue): N/A Yes Yes Fascia: No Fascia: No Tendon:  No Tendon: No Muscle: No Muscle: No Joint: No Joint: No Bone: No Bone: No Epithelialization: None Small (1-33%) N/A Treatment Notes Electronic Signature(s) Signed: 11/05/2020 7:55:29 AM By: Carlene Coria RN Entered By: Carlene Coria on 11/02/2020 10:07:37 Chad [name] ([id number]) Chad [name] ([id number]) -------------------------------------------------------------------------------- Multi-Disciplinary Care Plan Details Patient Name: Chad [name] Date of Service: 11/02/2020 10:15 AM Medical Record Number: [id number] Patient Account Number: [id number] Date of Birth/Sex: 1960-08-25 (61 y.o. M) Treating RN: Carlene Coria Primary Care Cammy Sanjurjo: Clayborn Bigness Other Clinician: Jeanine Luz Referring Astra Gregg: Clayborn Bigness Treating Saadia Dewitt/Extender: Jeri Cos Weeks in Treatment: 11 Active Inactive Electronic Signature(s) Signed: 11/05/2020 7:55:29 AM By: Carlene Coria RN Entered By: Carlene Coria on 11/02/2020 10:07:29 Chad [name] ([id number]) -------------------------------------------------------------------------------- Pain Assessment Details Patient Name: Chad [name] Date of Service: 11/02/2020 10:15  AM Medical Record Number: [id number] Patient Account Number: [id number] Date of Birth/Sex: July 13, 1961 (60 y.o. M) Treating RN: Donnamarie Poag Primary Care Damere Brandenburg: Clayborn Bigness Other Clinician: Jeanine Luz Referring Vaiden Adames: Clayborn Bigness Treating Kolbe Delmonaco/Extender: Skipper Cliche in Treatment: 11 Active Problems Location of Pain Severity and Description of Pain Patient Has Paino Yes Site Locations Pain Location: Pain in Ulcers Rate the pain. Current Pain Level: 3 Pain Management and Medication Current Pain Management: Electronic Signature(s) Signed: 11/02/2020 4:43:09 PM By: Donnamarie Poag Entered By: Donnamarie Poag on 11/02/2020 09:41:26 Chad [name]  ([id number]) -------------------------------------------------------------------------------- Patient/Caregiver Education Details Patient Name: Chad [name] Date of Service: 11/02/2020 10:15 AM Medical Record Number: [id number] Patient Account Number: [id number] Date of Birth/Gender: 12-11-1960 (60 y.o. M) Treating RN: Carlene Coria Primary Care Physician: Clayborn Bigness Other Clinician: Jeanine Luz Referring Physician: Clayborn Bigness Treating Physician/Extender: Skipper Cliche in Treatment: 11 Education Assessment Education Provided To: Patient Education Topics Provided Wound/Skin Impairment: Methods: Explain/Verbal Responses: State content correctly Electronic Signature(s) Signed: 11/05/2020 7:55:29 AM By: Carlene Coria RN Entered By: Carlene Coria on 11/02/2020 10:12:20 Chad [name] ([id number]) -------------------------------------------------------------------------------- Wound Assessment Details Patient Name: Chad [name] Date of Service: 11/02/2020 10:15 AM Medical Record Number: [id number] Patient Account Number: [id number] Date of Birth/Sex: [birth year] (60 y.o. M) Treating RN: Donnamarie Poag Primary Care Jurni Cesaro: Clayborn Bigness Other Clinician: Jeanine Luz Referring Hartwell Vandiver: Clayborn Bigness Treating Tahesha Skeet/Extender: Jeri Cos Weeks in Treatment: 11 Wound Status Wound Number: 6 Primary Abscess Etiology: Wound Location: Right Toe Second Wound Open Wounding Event: Gradually Appeared Status: Date Acquired: 03/28/2020 Comorbid Anemia, Chronic Obstructive Pulmonary Disease (COPD), Weeks Of Treatment: 11 History: Hepatitis C, Received Chemotherapy Clustered Wound: No Photos Wound Measurements Length: (cm) 1 Width: (cm) 0.2 Depth: (cm) 0.1 Area: (cm) 0.157 Volume: (cm) 0.016 % Reduction in Area: 86.9% % Reduction in Volume: 86.7% Epithelialization: None Tunneling: No Undermining: No Wound Description Classification: Full Thickness Without Exposed Support  Structures Exudate Amount: Small Exudate Type: Serous Exudate Color: amber Foul Odor After Cleansing: No Slough/Fibrino Yes Wound Bed Granulation Amount: Small (1-33%) Exposed Structure Granulation Quality: Red Fascia Exposed: No Necrotic Amount: Large (67-100%) Fat Layer (Subcutaneous Tissue) Exposed: Yes Necrotic Quality: Eschar Tendon Exposed: No Muscle Exposed: No Joint Exposed: No Bone Exposed: No Treatment Notes Wound #6 (Toe Second) Wound Laterality: Right Cleanser Normal Saline Discharge Instruction: Wash your hands with soap and water. Remove old dressing, discard into plastic bag and place into trash. Cleanse the wound with Normal Saline prior to applying a clean dressing using gauze sponges, not tissues or cotton balls. Do not scrub or use excessive force. Pat dry using gauze sponges, not tissue or cotton balls. Chad [name], Chad [name] ([id number]) Peri-Wound Care Topical Primary Dressing Silvercel Small 2x2 (in/in) Discharge Instruction: Apply Silvercel Small 2x2 (in/in) as instructed Secondary Dressing Coverlet Latex-Free Fabric Adhesive Dressings Discharge Instruction: Secure just the wounded toe Secured With Compression Wrap Compression Stockings Add-Ons Electronic Signature(s) Signed: 11/02/2020 4:43:09 PM By: Donnamarie Poag Entered By: Donnamarie Poag on 11/02/2020 09:44:50 Chad [name] ([id number]) -------------------------------------------------------------------------------- Wound Assessment Details Patient Name: Chad [name] Date of Service: 11/02/2020 10:15 AM Medical Record Number: [id number] Patient Account Number: [id number] Date of Birth/Sex: September 07, 1960 (60 y.o. M) Treating RN: Donnamarie Poag Primary Care Astryd Pearcy: Clayborn Bigness Other Clinician: Jeanine Luz Referring [place]: Clayborn Bigness Treating Kissa Campoy/Extender: Jeri Cos Weeks in Treatment: 11 Wound Status Wound Number: 8 Primary Venous Leg Ulcer Etiology: Wound Location: Left, Medial  Ankle Wound Open Wounding Event: Gradually Appeared Status: Date Acquired: 07/12/2019 Comorbid Anemia, Chronic Obstructive Pulmonary Disease (COPD), Weeks  Of Treatment: 11 History: Hepatitis C, Received Chemotherapy Clustered Wound: No Photos Wound Measurements Length: (cm) 4.9 Width: (cm) 2.7 Depth: (cm) 0.1 Area: (cm) 10.391 Volume: (cm) 1.039 % Reduction in Area: -76.4% % Reduction in Volume: -76.4% Epithelialization: Small (1-33%) Tunneling: No Undermining: No Wound Description Classification: Full Thickness Without Exposed Support Structures Wound Margin: Flat and Intact Exudate Amount: Large Exudate Type: Serous Exudate Color: amber Foul Odor After Cleansing: No Slough/Fibrino Yes Wound Bed Granulation Amount: Small (1-33%) Exposed Structure Granulation Quality: Red, Pink Fascia Exposed: No Necrotic Amount: Large (67-100%) Fat Layer (Subcutaneous Tissue) Exposed: Yes Necrotic Quality: Adherent Slough Tendon Exposed: No Muscle Exposed: No Joint Exposed: No Bone Exposed: No Treatment Notes Wound #8 (Ankle) Wound Laterality: Left, Medial Cleanser Normal Saline Discharge Instruction: Wash your hands with soap and water. Remove old dressing, discard into plastic bag and place into trash. Cleanse the wound with Normal Saline prior to applying a clean dressing using gauze sponges, not tissues or cotton balls. Do not scrub or use excessive force. Pat dry using gauze sponges, not tissue or cotton balls. Chad [name], Chad [name] ([id number]) Peri-Wound Care Desitin Maximum Strength Ointment 4 (oz) Discharge Instruction: Zinc periwound Topical Primary Dressing Silvercel Small 2x2 (in/in) Quantity: 1 Discharge Instruction: Apply Silvercel Small 2x2 (in/in) as instructed Secondary Dressing ABD Pad 5x9 (in/in) Quantity: 1 Discharge Instruction: Cover with ABD pad Kerlix 4.5 x 4.1 (in/yd) Quantity: 1 Discharge Instruction: Apply Kerlix 4.5 x 4.1 (in/yd) as  instructed Secured With 83M Medipore H Soft Cloth Surgical Tape, 2x2 (in/yd) Compression Wrap Compression Stockings Add-Ons Electronic Signature(s) Signed: 11/02/2020 4:43:09 PM By: Donnamarie Poag Entered By: Donnamarie Poag on 11/02/2020 09:45:47 Chad [name] ([id number]) -------------------------------------------------------------------------------- [place] Details Patient Name: Chad [name] Date of Service: 11/02/2020 10:15 AM Medical Record Number: [id number] Patient Account Number: [id number] Date of Birth/Sex: [birth year] (60 y.o. M) Treating RN: Donnamarie Poag Primary Care Catarino Vold: Clayborn Bigness Other Clinician: Jeanine Luz Referring Jonquil Stubbe: Clayborn Bigness Treating Nichlas Pitera/Extender: Skipper Cliche in Treatment: 11 Vital Signs Time Taken: 09:37 Temperature (F): 98.1 Height (in): 67 Pulse (bpm): 91 Weight (lbs): 160 Respiratory Rate (breaths/min): 18 Body Mass Index (BMI): 25.1 Blood Pressure (mmHg): 123/75 Reference Range: 80 - 120 mg / dl Electronic Signature(s) Signed: 11/02/2020 4:43:09 PM By: Donnamarie Poag Entered ByDonnamarie Poag on 11/02/2020 09:41:15

## 2020-11-06 ENCOUNTER — Ambulatory Visit
Admission: RE | Admit: 2020-11-06 | Discharge: 2020-11-06 | Disposition: A | Discharge status: Home / Self Care | Payer: Medicaid Other | Attending: Gastroenterology | Admitting: Gastroenterology

## 2020-11-06 ENCOUNTER — Encounter
Admission: RE | Disposition: A | Discharge status: Home / Self Care | Payer: Self-pay | Source: Home / Self Care | Attending: Gastroenterology

## 2020-11-06 ENCOUNTER — Other Ambulatory Visit: Payer: Self-pay

## 2020-11-06 ENCOUNTER — Ambulatory Visit: Payer: Medicaid Other | Admitting: Anesthesiology

## 2020-11-06 ENCOUNTER — Encounter: Payer: Self-pay | Admitting: Gastroenterology

## 2020-11-06 DIAGNOSIS — Z79899 Other long term (current) drug therapy: Secondary | ICD-10-CM | POA: Insufficient documentation

## 2020-11-06 DIAGNOSIS — K633 Ulcer of intestine: Secondary | ICD-10-CM | POA: Insufficient documentation

## 2020-11-06 DIAGNOSIS — C187 Malignant neoplasm of sigmoid colon: Secondary | ICD-10-CM | POA: Insufficient documentation

## 2020-11-06 DIAGNOSIS — Z980 Intestinal bypass and anastomosis status: Secondary | ICD-10-CM | POA: Insufficient documentation

## 2020-11-06 DIAGNOSIS — Z85038 Personal history of other malignant neoplasm of large intestine: Secondary | ICD-10-CM | POA: Diagnosis not present

## 2020-11-06 DIAGNOSIS — F17210 Nicotine dependence, cigarettes, uncomplicated: Secondary | ICD-10-CM | POA: Insufficient documentation

## 2020-11-06 DIAGNOSIS — R9389 Abnormal findings on diagnostic imaging of other specified body structures: Secondary | ICD-10-CM | POA: Diagnosis not present

## 2020-11-06 DIAGNOSIS — Z7902 Long term (current) use of antithrombotics/antiplatelets: Secondary | ICD-10-CM | POA: Diagnosis not present

## 2020-11-06 DIAGNOSIS — Z880 Allergy status to penicillin: Secondary | ICD-10-CM | POA: Insufficient documentation

## 2020-11-06 DIAGNOSIS — K621 Rectal polyp: Secondary | ICD-10-CM | POA: Diagnosis not present

## 2020-11-06 DIAGNOSIS — Z7982 Long term (current) use of aspirin: Secondary | ICD-10-CM | POA: Insufficient documentation

## 2020-11-06 DIAGNOSIS — Z8546 Personal history of malignant neoplasm of prostate: Secondary | ICD-10-CM | POA: Insufficient documentation

## 2020-11-06 DIAGNOSIS — Z8551 Personal history of malignant neoplasm of bladder: Secondary | ICD-10-CM | POA: Insufficient documentation

## 2020-11-06 DIAGNOSIS — Z800 Family history of malignant neoplasm of digestive organs: Secondary | ICD-10-CM | POA: Insufficient documentation

## 2020-11-06 DIAGNOSIS — R933 Abnormal findings on diagnostic imaging of other parts of digestive tract: Secondary | ICD-10-CM | POA: Diagnosis present

## 2020-11-06 HISTORY — PX: COLONOSCOPY WITH PROPOFOL: SHX5780

## 2020-11-06 SURGERY — COLONOSCOPY WITH PROPOFOL
Anesthesia: General

## 2020-11-06 MED ORDER — LIDOCAINE HCL (PF) 2 % IJ SOLN
INTRAMUSCULAR | Status: AC
  Filled 2020-11-06: qty 5

## 2020-11-06 MED ORDER — SODIUM CHLORIDE 0.9 % IV SOLN: INTRAVENOUS | Status: DC

## 2020-11-06 MED ORDER — PROPOFOL 10 MG/ML IV BOLUS
INTRAVENOUS | Status: AC
  Filled 2020-11-06: qty 20

## 2020-11-06 MED ORDER — PROPOFOL 500 MG/50ML IV EMUL
INTRAVENOUS | Status: AC
  Filled 2020-11-06: qty 100

## 2020-11-06 MED ORDER — PROPOFOL 10 MG/ML IV BOLUS
INTRAVENOUS | Status: DC | PRN
  Administered 2020-11-06: 60 mg via INTRAVENOUS

## 2020-11-06 MED ORDER — PROPOFOL 500 MG/50ML IV EMUL
INTRAVENOUS | Status: DC | PRN
  Administered 2020-11-06: 140 ug/kg/min via INTRAVENOUS

## 2020-11-06 MED ORDER — HEPARIN SOD (PORK) LOCK FLUSH 100 UNIT/ML IV SOLN
INTRAVENOUS | Status: AC
  Filled 2020-11-06: qty 5

## 2020-11-06 NOTE — Transfer of Care (Signed)
Immediate Anesthesia Transfer of Care Note  Patient: Chad [name]  Procedure(s) Performed: COLONOSCOPY WITH PROPOFOL (N/A )  Patient Location: PACU  Anesthesia Type:General  Level of Consciousness: sedated  Airway & Oxygen Therapy: Patient Spontanous Breathing  Post-op Assessment: Report given to RN and Post -op Vital signs reviewed and stable  Post vital signs: Reviewed and stable  Last Vitals:  Vitals Value Taken Time  BP 96/62 11/06/20 0904  Temp    Pulse 84 11/06/20 0904  Resp 9 11/06/20 0904  SpO2 93 % 11/06/20 0904  Vitals shown include unvalidated device data.  Last Pain:  Vitals:   11/06/20 0809  TempSrc: Temporal         Complications: No complications documented.

## 2020-11-06 NOTE — Op Note (Signed)
[facility] Gastroenterology Patient Name: Chad [name] Procedure Date: 11/06/2020 8:27 AM MRN: [id number] Account #: [id number] Date of Birth: [birth year] Admit Type: Outpatient Age: 60 Room: [facility] ENDO ROOM 2 Gender: Male Note Status: Finalized Procedure:             Colonoscopy Indications:           Abnormal CT of the GI tract Providers:             Jonathon Bellows MD, MD Referring MD:          [name], MD (Referring MD) Medicines:             Monitored Anesthesia Care Complications:         No immediate complications. Procedure:             Pre-Anesthesia Assessment:                        - Prior to the procedure, a History and Physical was                         performed, and patient medications, allergies and                         sensitivities were reviewed. The patient's tolerance                         of previous anesthesia was reviewed.                        - The risks and benefits of the procedure and the                         sedation options and risks were discussed with the                         patient. All questions were answered and informed                         consent was obtained.                        - ASA Grade Assessment: III - A patient with severe                         systemic disease.                        After obtaining informed consent, the colonoscope was                         passed under direct vision. Throughout the procedure,                         the patient's blood pressure, pulse, and oxygen                         saturations were monitored continuously. The                         Colonoscope was introduced through the  anus and                         advanced to the the ileocolonic anastomosis. The                         colonoscopy was performed with ease. The patient                         tolerated the procedure well. The quality of the bowel                         preparation was  excellent. Findings:      The perianal and digital rectal examinations were normal.      There was evidence of a prior end-to-end ileo-colonic anastomosis in the       transverse colon. This was characterized by healthy appearing mucosa.       The anastomosis was not traversed.      A 25 mm polypoid lesion was found in the sigmoid colon. The lesion was       granular lateral spreading. No bleeding was present. This was biopsied       with a cold forceps for histology.      Two semi-sessile polyps were found in the rectum and proximal rectum.       The polyps were 3 to 4 mm in size. I did not attempt to resect it since       the lesion higher up appears malignant .      The exam was otherwise without abnormality. Impression:            - End-to-end ileo-colonic anastomosis, characterized                         by healthy appearing mucosa.                        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 Biopsied.                        - Two 3 to 4 mm polyps in the rectum and in the                         proximal rectum.                        - The examination was otherwise normal. Recommendation:        - Discharge patient to home (with escort).                        - Resume previous diet.                        - Continue present medications.                        - Await pathology results.                        -  Sigmoid colon ulcertated lesion appears malignant ,                         did not resect rectal polyps as it wouldnt change                         management . If rectal lesions need to be resected ,                         it can be done after discussion with Dr [name]                         considering overall prognosis and goals to determine                         risk vs benefit ratio of performing endoscopic mucosal                         resection. Procedure Code(s):     --- Professional ---                         [phone], Colonoscopy, flexible; with biopsy, single or                         multiple Diagnosis Code(s):     --- Professional ---                        Z98.0, Intestinal bypass and anastomosis status                        D49.0, Neoplasm of unspecified behavior of digestive                         system                        K62.1, Rectal polyp                        R93.3, Abnormal findings on diagnostic imaging of                         other parts of digestive tract CPT copyright 2019 [employer]. All rights reserved. The codes documented in this report are preliminary and upon coder review may  be revised to meet current compliance requirements. Jonathon Bellows, MD Jonathon Bellows MD, MD 11/06/2020 9:04:41 AM This report has been signed electronically. Number of Addenda: 0 Note Initiated On: 11/06/2020 8:27 AM Scope Withdrawal Time: 0 hours 5 minutes 9 seconds  Total Procedure Duration: 0 hours 10 minutes 52 seconds  Estimated Blood Loss:  Estimated blood loss: none.      [facility]

## 2020-11-06 NOTE — Progress Notes (Signed)
Port a cath accessed by Peter Garter RN

## 2020-11-06 NOTE — H&P (Signed)
Chad Bellows, MD [address] [place], [place], [place], [address] [address], [place], [place], [place], [address] Phone: [phone]  Fax: [phone]  Primary Care Physician:  [name], MD   Pre-Procedure History & Physical: HPI:  Chad [name] is a 60 y.o. male is here for an colonoscopy.   Past Medical History:  Diagnosis Date  . Anemia   . Anxiety   . ARF (acute respiratory failure) ([place])   . Bladder cancer ([place])   . COPD (chronic obstructive pulmonary disease) ([facility])   . Depression   . Dysphagia   . Family history of colon cancer   . Family history of kidney cancer   . Family history of leukemia   . Family history of prostate cancer   . GERD (gastroesophageal reflux disease)   . Hepatitis    chronic hep c  . Hydronephrosis   . Hydronephrosis with ureteral stricture   . Hyperlipidemia   . Knee pain    Left  . Malignant neoplasm of colon ([place])   . Nerve pain   . Peripheral vascular disease ([place])   . Prostate cancer ([place])   . Stroke ([place])   . Urinary frequency   . Venous hypertension of both lower extremities     Past Surgical History:  Procedure Laterality Date  . COLON SURGERY     En bloc extended right hemicolectomy 07/2017  . CYSTOSCOPY W/ RETROGRADES Right 08/30/2018   Procedure: CYSTOSCOPY WITH RETROGRADE PYELOGRAM;  Surgeon: Hollice Espy, MD;  Location: [facility];  Service: Urology;  Laterality: Right;  . CYSTOSCOPY WITH STENT PLACEMENT Right 04/25/2018   Procedure: CYSTOSCOPY WITH STENT PLACEMENT;  Surgeon: Hollice Espy, MD;  Location: [facility];  Service: Urology;  Laterality: Right;  . CYSTOSCOPY WITH STENT PLACEMENT Right 08/30/2018   Procedure: [place] WITH STENT Exchange;  Surgeon: Hollice Espy, MD;  Location: [facility];  Service: Urology;  Laterality: Right;  . CYSTOSCOPY WITH STENT PLACEMENT Right 03/07/2019   Procedure: CYSTOSCOPY WITH STENT Exchange;  Surgeon: Hollice Espy, MD;  Location: [facility];  Service: Urology;   Laterality: Right;  . CYSTOSCOPY WITH STENT PLACEMENT Right 11/21/2019   Procedure: CYSTOSCOPY WITH STENT Exchange;  Surgeon: Hollice Espy, MD;  Location: [facility];  Service: Urology;  Laterality: Right;  . LOWER EXTREMITY ANGIOGRAPHY Left 05/23/2019   Procedure: LOWER EXTREMITY ANGIOGRAPHY;  Surgeon: Algernon Huxley, MD;  Location: [facility];  Service: Cardiovascular;  Laterality: Left;  . LOWER EXTREMITY ANGIOGRAPHY Right 05/30/2019   Procedure: LOWER EXTREMITY ANGIOGRAPHY;  Surgeon: Algernon Huxley, MD;  Location: [facility];  Service: Cardiovascular;  Laterality: Right;  . LOWER EXTREMITY ANGIOGRAPHY Right 02/13/2020   Procedure: LOWER EXTREMITY ANGIOGRAPHY;  Surgeon: Algernon Huxley, MD;  Location: [facility];  Service: Cardiovascular;  Laterality: Right;  . LOWER EXTREMITY ANGIOGRAPHY Left 02/20/2020   Procedure: LOWER EXTREMITY ANGIOGRAPHY;  Surgeon: Algernon Huxley, MD;  Location: [facility];  Service: Cardiovascular;  Laterality: Left;  . PORTA CATH INSERTION N/A 02/28/2019   Procedure: PORTA CATH INSERTION;  Surgeon: Algernon Huxley, MD;  Location: [facility];  Service: Cardiovascular;  Laterality: N/A;  . tumor removed       Prior to Admission medications   Medication Sig Start Date End Date Taking? Authorizing Provider  aspirin EC 81 MG tablet Take 1 tablet (81 mg total) by mouth daily. 05/23/19  Yes Algernon Huxley, MD  clopidogrel (PLAVIX) 75 MG  tablet Take 1 tablet (75 mg total) by mouth daily. 05/23/19  Yes Dew, Erskine Squibb, MD  cyclobenzaprine (FLEXERIL) 10 MG tablet Take 10 mg by mouth at bedtime.   Yes [provider]  escitalopram (LEXAPRO) 5 MG tablet Take 5 mg by mouth daily.   Yes [provider]  gabapentin (NEURONTIN) 100 MG capsule Take 100 mg by mouth 3 (three) times daily.   Yes [provider]  mirtazapine (REMERON) 7.5 MG tablet Take 7.5 mg by mouth at bedtime.   Yes [provider]  pantoprazole  (PROTONIX) 40 MG tablet Take 40 mg by mouth daily.   Yes [provider]  pravastatin (PRAVACHOL) 10 MG tablet Take 10 mg by mouth daily.   Yes [provider]  RESTORE CALCIUM ALGINATE EX Apply topically. Note: Dressing located on left ankle. Facility cleaning wound "every day shift and applying wound cleanser and appication of calcium Alginate and cover with dry dressing and wrapped with Kerlix"   Yes [provider]  acetaminophen (TYLENOL) 325 MG tablet Take 650 mg by mouth 3 (three) times daily.     [provider]  atorvastatin (LIPITOR) 10 MG tablet Take 1 tablet (10 mg total) by mouth daily. 05/23/19 05/31/20  Algernon Huxley, MD  Cholecalciferol (VITAMIN D) 50 MCG (2000 UT) CAPS Take by mouth.    [provider]  hydroxypropyl methylcellulose / hypromellose (ISOPTO TEARS / GONIOVISC) 2.5 % ophthalmic solution Place 1 drop into both eyes every 12 (twelve) hours.    [provider]  Oxycodone HCl 10 MG TABS Take 10 mg by mouth every 6 (six) hours as needed for pain.    [provider]  Pollen Extracts (PROSTAT PO) Take 30 mLs by mouth.    [provider]    Allergies as of 10/23/2020 - Review Complete 10/19/2020  Allergen Reaction Noted  . Penicillins Rash 11/11/2017    Family History  Problem Relation Age of Onset  . Prostate cancer Father 68  . Cancer Brother 34       unsure type  . Cancer Paternal Uncle        unsure type  . Cancer Maternal Grandmother        unsure type  . Cancer Paternal Grandmother        unsure type  . Kidney cancer Paternal Grandfather   . Cancer Other        unsure types  . Leukemia Son   . Cancer Son        other cancers, possibly colon    Social History   Socioeconomic History  . Marital status: Single    Spouse name: Not on file  . Number of children: Not on file  . Years of education: Not on file  . Highest education level: Not on file  Occupational History  . Not on  file  Tobacco Use  . Smoking status: Current Every Day Smoker    Packs/day: 1.00    Types: Cigarettes  . Smokeless tobacco: Never Used  Vaping Use  . Vaping Use: Never used  Substance and Sexual Activity  . Alcohol use: Not Currently  . Drug use: Not Currently  . Sexual activity: Not Currently  Other Topics Concern  . Not on file  Social History Narrative    used to live [place]; moved  To [place]- end of April 2019; in Nursing home; 1pp/day; quit alcohol. 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  Family- dad- prostate ca [at 40y]; brother- 71 died of prostate cancer; brother- 66- no cancers [[place]]; sonGerald Stabs [[place]];[name]-32y prostate ca [facility] [place]]; daughter- 52 [[place]]; another daughter 61 [[place]/addict]. will refer genetics counseling. Given MSI- abnormal; highly suspicious of Lynch syndrome.  Patient's son [name] aware of high possible lynch syndrome.   Social Determinants of Health   Financial Resource Strain: Not on file  Food Insecurity: Not on file  Transportation Needs: Not on file  Physical Activity: Not on file  Stress: Not on file  Social Connections: Not on file  Intimate Partner Violence: Not on file    Review of Systems: See HPI, otherwise negative ROS  Physical Exam: There were no vitals taken for this visit. General:   Alert,  pleasant and cooperative in NAD Head:  Normocephalic and atraumatic. Neck:  Supple; no masses or thyromegaly. Lungs:  Clear throughout to auscultation, normal respiratory effort.    Heart:  +S1, +S2, Regular rate and rhythm, No edema. Abdomen:  Soft, nontender and nondistended. Normal bowel sounds, without guarding, and without rebound.   Neurologic:  Alert and  oriented x4;  grossly normal neurologically.  Impression/Plan: Chad [name] is here for an colonoscopy to be performed for abnormal ct scan Risks, benefits, limitations, and alternatives regarding  colonoscopy have been reviewed with the patient.  Questions have been answered.   All parties agreeable.   Chad Bellows, MD  11/06/2020, 7:59 AM

## 2020-11-06 NOTE — Anesthesia Postprocedure Evaluation (Signed)
Anesthesia Post Note  Patient: Chad [name]  Procedure(s) Performed: COLONOSCOPY WITH PROPOFOL (N/A )  Patient location during evaluation: Endoscopy Anesthesia Type: General Level of consciousness: awake and alert Pain management: pain level controlled Vital Signs Assessment: post-procedure vital signs reviewed and stable Respiratory status: spontaneous breathing, nonlabored ventilation, respiratory function stable and patient connected to nasal cannula oxygen Cardiovascular status: blood pressure returned to baseline and stable Postop Assessment: no apparent nausea or vomiting Anesthetic complications: no   No complications documented.   Last Vitals:  Vitals:   11/06/20 0904 11/06/20 0924  BP: 96/62 124/84  Pulse:    Resp:    Temp: (!) 36.1 C   SpO2:      Last Pain:  Vitals:   11/06/20 0924  TempSrc:   PainSc: 0-No pain                 Martha Clan

## 2020-11-06 NOTE — Progress Notes (Signed)
No risk

## 2020-11-06 NOTE — Anesthesia Preprocedure Evaluation (Signed)
Anesthesia Evaluation  Patient identified by MRN, date of birth, ID band Patient awake    Reviewed: Allergy & Precautions, H&P , NPO status , Patient's Chart, lab work & pertinent test results, reviewed documented beta blocker date and time   History of Anesthesia Complications Negative for: history of anesthetic complications  Airway Mallampati: II  TM Distance: >3 FB Neck ROM: full    Dental  (+) Edentulous Upper, Edentulous Lower   Pulmonary neg shortness of breath, COPD, neg recent URI, Current Smoker,    Pulmonary exam normal        Cardiovascular Exercise Tolerance: Good (-) hypertension(-) angina+ Peripheral Vascular Disease  Normal cardiovascular exam(-) dysrhythmias (-) Valvular Problems/Murmurs Rate:Normal     Neuro/Psych neg Seizures PSYCHIATRIC DISORDERS Anxiety Depression CVA    GI/Hepatic GERD  Medicated,(+) Hepatitis -, C  Endo/Other  negative endocrine ROS  Renal/GU Renal disease  negative genitourinary   Musculoskeletal   Abdominal   Peds  Hematology  (+) Blood dyscrasia, anemia ,   Anesthesia Other Findings Past Medical History: No date: Anemia No date: Anxiety No date: ARF (acute respiratory failure) ([facility]) No date: Bladder cancer ([place]) No date: COPD (chronic obstructive pulmonary disease) ([facility]) No date: Depression No date: Dysphagia No date: Family history of colon cancer No date: Family history of kidney cancer No date: Family history of leukemia No date: Family history of prostate cancer No date: GERD (gastroesophageal reflux disease) No date: Hepatitis     Comment:  chronic hep c No date: Hydronephrosis No date: Hydronephrosis with ureteral stricture No date: Hyperlipidemia No date: Knee pain     Comment:  Left No date: Malignant neoplasm of colon ([facility]) No date: Nerve pain No date: Peripheral vascular disease ([facility]) No date: Prostate cancer ([place]) No date: Stroke ([place]) No date:  Urinary frequency No date: Venous hypertension of both lower extremities   Reproductive/Obstetrics negative OB ROS                             Anesthesia Physical  Anesthesia Plan  ASA: III  Anesthesia Plan: General   Post-op Pain Management:    Induction: Intravenous  PONV Risk Score and Plan: 1 and TIVA and Propofol infusion  Airway Management Planned: Natural Airway and Nasal Cannula  Additional Equipment:   Intra-op Plan:   Post-operative Plan: Extubation in OR  Informed Consent: I have reviewed the patients History and Physical, chart, labs and discussed the procedure including the risks, benefits and alternatives for the proposed anesthesia with the patient or authorized representative who has indicated his/her understanding and acceptance.       Plan Discussed with: CRNA  Anesthesia Plan Comments:         Anesthesia Quick Evaluation

## 2020-11-07 ENCOUNTER — Encounter: Payer: Self-pay | Admitting: Gastroenterology

## 2020-11-08 LAB — SURGICAL PATHOLOGY

## 2020-11-09 ENCOUNTER — Other Ambulatory Visit: Payer: Self-pay

## 2020-11-09 ENCOUNTER — Encounter: Payer: Medicaid Other | Admitting: Physician Assistant

## 2020-11-09 ENCOUNTER — Other Ambulatory Visit
Admission: RE | Admit: 2020-11-09 | Discharge: 2020-11-09 | Disposition: A | Discharge status: Home / Self Care | Payer: Medicaid Other | Source: Ambulatory Visit | Attending: Physician Assistant | Admitting: Physician Assistant

## 2020-11-09 DIAGNOSIS — L089 Local infection of the skin and subcutaneous tissue, unspecified: Secondary | ICD-10-CM | POA: Diagnosis not present

## 2020-11-09 DIAGNOSIS — L97322 Non-pressure chronic ulcer of left ankle with fat layer exposed: Secondary | ICD-10-CM | POA: Diagnosis not present

## 2020-11-09 NOTE — Progress Notes (Addendum)
[name], [name] ([id number]) Visit Report for 11/09/2020 Chief Complaint Document Details Patient Name: Chad [name] Date of Service: 11/09/2020 10:30 AM Medical Record Number: [id number] Patient Account Number: [id number] Date of Birth/Sex: [birth year] (60 y.o. M) Treating RN: Carlene Coria Primary Care Provider: Clayborn Bigness Other Clinician: Referring Provider: Clayborn Bigness Treating Provider/Extender: Skipper Cliche in Treatment: 12 Information Obtained from: Patient Chief Complaint Left ankle and right foot and heel ulcers Electronic Signature(s) Signed: 11/09/2020 11:38:44 AM By: Worthy Keeler PA-C Entered By: Worthy Keeler on 11/09/2020 11:38:44 Chad [name] ([id number]) -------------------------------------------------------------------------------- HPI Details Patient Name: Chad [name] Date of Service: 11/09/2020 10:30 AM Medical Record Number: [id number] Patient Account Number: [id number] Date of Birth/Sex: [birth year] (60 y.o. M) Treating RN: Carlene Coria Primary Care Provider: Clayborn Bigness Other Clinician: Referring Provider: Clayborn Bigness Treating Provider/Extender: Skipper Cliche in Treatment: 12 History of Present Illness HPI Description: 10/08/18 on evaluation today patient actually presents to our office for initial evaluation concerning wounds that he has of the bilateral lower extremities. 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 He also has bilateral venous stasis. He apparently has been homeless currently following discharge from the hospital apparently he has been placed at [facility] which is is a skilled nursing facility locally. Nonetheless fortunately he does not show any signs of infection at this time which is good news. In fact several of the wound actually appears to be showing some  signs of improvement already in my pinion. There are a couple areas in the left leg in particular there likely gonna require some sharp debridement to help clear away some necrotic tissue and help with more sufficient healing. No fevers, chills, nausea, or vomiting noted at this time. 10/15/18 on evaluation today patient actually appears to be doing very well in regard to his bilateral lower extremities. He's been tolerating the dressing changes without complication. Fortunately there does not appear to be any evidence of active infection at this time which is great news. Overall I'm actually very pleased with how this has progressed in just one visits time. Readmission: 08/14/2020 upon evaluation today patient presents for re-evaluation here in our clinic. He is having issues with his left ankle region as well as his right toe and his right heel. He tells me that the toe and heel actually began as a area that was itching that he was scratching and then subsequently opened up into wounds. These may have been abscess areas I presume based on what I am seeing currently. With regard to his left ankle region he tells me this was a similar type occurrence although he does have venous stasis this very well may be more of a venous leg ulcer more than anything. Nonetheless I do believe that the patient would benefit from appropriate and aggressive wound care to try to help get things under better control here. He does have history of a stroke on the left side affecting him to some degree there that he is able to stand although he does have some residual weakness. Otherwise again the patient does have chronic venous insufficiency as previously noted. His arterial studies most recently obtained showed that he had an ABI on the right of 1.16 with a TBI of 0.52 and on the left and ABI of 1.14 with a TBI of 0.81. That was obtained on 06/19/2020. 08/28/2020 upon evaluation today  patient appears to be doing decently  well in regard to his wounds in general. He has been tolerating the dressing changes without complication. Fortunately there does not appear to be any signs of active infection which is great news. With that being said I think the [facility] is doing a good job I would recommend that we likely continue with that currently. 09/11/2020 upon evaluation today patient's wounds did not appear to be doing too poorly but again he is not really showing signs of significant improvement with regard to any of the wounds on the right. None of them have Hydrofera Blue on them I am not exactly sure why this is not being followed as the facility did not contact us to let us know of any issues with obtaining dressings or otherwise. With that being said he is supposed to be using Hydrofera Blue on both of the wounds on the right foot as well as the ankle wound on the left side. 09/18/2020 upon evaluation today patient appears to be doing poorly with regard to his wounds. Again right now the left ankle in particular showed signs of extreme maceration. Apparently he was told by someone with staff at [facility] they could not get the [facility]. With that being said this is something that is never been relayed to [place] one way or another. Also the patient subsequently has not supposed to have a border gauze dressing on. He should have an ABD pad and roll gauze to secure as this drains much too much just to have a border gauze dressing to cover. Nonetheless the fact that they are not using the appropriate dressing is directly causing deterioration of the left ankle wound it is significantly worse today compared to what it was previous. I did attempt to call [facility] while the patient was here I called three times and got no one to even pick up the phone. After this I had my for an office coordinator call and she was able to finally get through and leave a message with the D ON as of dictation of this  note which is roughly about an hour and a half later I still have not been able to speak with anyone at the facility. 09/25/2020 upon evaluation today patient actually showing signs of good improvement which is excellent news. He has been tolerating the dressing changes without complication. Fortunately there is no signs of active infection which is great news. No fevers, chills, nausea, vomiting, or diarrhea. I do feel like the facility has been doing a much better job at taking care of him as far as the dressings are concerned. However the director of nursing never did call me back. 10/09/2020 upon evaluation today patient appears to be doing well with regard to his wound. The toe ulcer did require some debridement but the other 2 areas actually appear to be doing quite well. 10/19/2020 upon evaluation today patient actually appears to be doing very well in regard to his wounds. In fact the heel does appear to be completely healed. The toe is doing better in the medial ankle on the left is also doing better. Overall I think he is headed in the right direction. 10/26/2020 upon evaluation today patient appears to be doing well with regard to his wound. He is showing signs of improvement which is great news and overall I am very pleased with where things stand today. No fevers, chills, nausea, vomiting, or diarrhea. 11/02/2020 upon evaluation today patient appears to  be doing well with regard to his wounds. He has been tolerating the dressing changes without complication overall I am extremely pleased with where things stand today. He in regard to the toe is almost completely healed and the medial ankle on the left is doing much better. 11/09/2020 upon evaluation today patient appears to be doing a little poorly in regard to his left medial ankle ulcer. Fortunately there does not appear to be any signs of systemic infection but unfortunately locally he does appear to be infected in fact he has blue-green  drainage consistent with Pseudomonas. CAIDYN, HENRICKSEN ([id number]) Electronic Signature(s) Signed: 11/09/2020 2:56:35 PM By: Worthy Keeler PA-C Entered By: Worthy Keeler on 11/09/2020 14:56:35 Chad [name] ([id number]) -------------------------------------------------------------------------------- Physical Exam Details Patient Name: Chad [name] Date of Service: 11/09/2020 10:30 AM Medical Record Number: [id number] Patient Account Number: [id number] Date of Birth/Sex: 09-19-1960 (60 y.o. M) Treating RN: Carlene Coria Primary Care Provider: Clayborn Bigness Other Clinician: Referring Provider: Clayborn Bigness Treating Provider/Extender: Skipper Cliche in Treatment: 4 Constitutional Well-nourished and well-hydrated in no acute distress. Respiratory normal breathing without difficulty. Psychiatric this patient is able to make decisions and demonstrates good insight into disease process. Alert and Oriented x 3. pleasant and cooperative. Notes Patient has a blue-green drainage from the left medial ankle region. With that being said I do think that we can go ahead and continue with the alginate dressings although I think that we do need to get him on an antibiotic as well a wound culture was also taken from the site for verification. Electronic Signature(s) Signed: 11/09/2020 2:57:47 PM By: Worthy Keeler PA-C Entered By: Worthy Keeler on 11/09/2020 14:57:46 Chad [name] ([id number]) -------------------------------------------------------------------------------- Physician Orders Details Patient Name: Chad [name] Date of Service: 11/09/2020 10:30 AM Medical Record Number: [id number] Patient Account Number: [id number] Date of Birth/Sex: 1961-03-10 (60 y.o. 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 Wound Treatment Wound #6 - Toe Second Wound Laterality: Right Cleanser: Normal Saline 1 x Per Day/30 Days Discharge Instructions: Wash your hands with soap and water. Remove old dressing, discard into plastic bag and place into trash. Cleanse the wound with Normal Saline prior to applying a clean dressing using gauze sponges, not tissues or cotton balls. Do not scrub or use excessive force. Pat dry using gauze sponges, not tissue or cotton balls. Primary Dressing: Silvercel Small 2x2 (in/in) (Generic) 1 x Per Day/30 Days Discharge Instructions: Apply Silvercel Small 2x2 (in/in) as instructed Secondary Dressing: Coverlet Latex-Free Fabric Adhesive Dressings 1 x Per Day/30 Days Discharge Instructions: Secure just the wounded toe Wound #8 - Ankle Wound Laterality: Left, Medial Cleanser: Normal Saline 1 x Per Day/30 Days Discharge Instructions: Wash your hands with soap and water. Remove old dressing, discard into plastic bag and place into trash. Cleanse the wound with Normal Saline prior to applying a clean dressing using gauze sponges, not tissues or cotton balls. Do not scrub or use excessive force. Pat dry using gauze sponges, not  tissue or cotton balls. Peri-Wound Care: Desitin Maximum Strength Ointment 4 (oz) 1 x Per Day/30 Days Discharge Instructions: Zinc periwound Primary Dressing: Silvercel Small 2x2 (in/in) 1 x Per Day/30 Days Discharge Instructions: Apply Silvercel Small 2x2 (in/in) as  instructed Secondary Dressing: ABD Pad 5x9 (in/in) 1 x Per Day/30 Days Discharge Instructions: Cover with ABD pad Secondary Dressing: Kerlix 4.5 x 4.1 (in/yd) 1 x Per Day/30 Days Discharge Instructions: Apply Kerlix 4.5 x 4.1 (in/yd) as instructed Secured With: 30M Medipore H Soft Cloth Surgical Tape, 2x2 (in/yd) 1 x Per Day/30 Days Laboratory o Bacteria identified in Wound by Culture (MICRO) - left medial ankle non healing wounds , green drainage , redness, pain - (ICD10 L97.322 - Non-pressure chronic ulcer of left ankle with fat layer exposed) oooo LOINC Code: 6462-6 oooo Convenience Name: Wound culture routine WHITTEN, ANDREONI ([id number]) Electronic Signature(s) Signed: 11/09/2020 5:20:58 PM By: Worthy Keeler PA-C Signed: 11/12/2020 7:55:11 AM By: Carlene Coria RN Entered By: Carlene Coria on 11/09/2020 12:50:09 Chad [name] ([id number]) -------------------------------------------------------------------------------- Problem List Details Patient Name: Chad [name] Date of Service: 11/09/2020 10:30 AM Medical Record Number: [id number] Patient Account Number: [id number] Date of Birth/Sex: 18-Aug-1960 (60 y.o. M) Treating RN: Carlene Coria Primary Care Provider: Clayborn Bigness Other Clinician: Referring Provider: Clayborn Bigness Treating Provider/Extender: Skipper Cliche in Treatment: 12 Active Problems ICD-10 Encounter Code Description Active Date MDM Diagnosis I87.2 Venous insufficiency (chronic) (peripheral) 08/14/2020 No Yes L97.322 Non-pressure chronic ulcer of left ankle with fat layer exposed 08/14/2020 No Yes L97.412 Non-pressure chronic ulcer of right heel and midfoot with fat layer 08/14/2020 No Yes exposed L97.512 Non-pressure chronic ulcer of other part of right foot with fat layer 08/14/2020 No Yes exposed I69.354 Hemiplegia and hemiparesis following cerebral infarction affecting left 08/14/2020 No Yes non-dominant side Inactive Problems Resolved Problems Electronic  Signature(s) Signed: 11/09/2020 11:38:39 AM By: Worthy Keeler PA-C Entered By: Worthy Keeler on 11/09/2020 11:38:39 Chad [name] ([id number]) -------------------------------------------------------------------------------- Progress Note Details Patient Name: Chad [name] Date of Service: 11/09/2020 10:30 AM Medical Record Number: [id number] Patient Account Number: [id number] Date of Birth/Sex: 1960/12/30 (60 y.o. M) Treating RN: Carlene Coria Primary Care Provider: Clayborn Bigness Other Clinician: Referring Provider: Clayborn Bigness Treating Provider/Extender: Skipper Cliche in Treatment: 12 Subjective Chief Complaint Information obtained from Patient Left ankle and right foot and heel ulcers History of Present Illness (HPI) 10/08/18 on evaluation today patient actually presents to our office for initial evaluation concerning wounds that he has of the bilateral lower extremities. 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 He also has bilateral venous stasis. He apparently has been homeless currently following discharge from the hospital apparently he has been placed at [facility] which is is a skilled nursing facility locally. Nonetheless fortunately he does not show any signs of infection at this time which is good news. In fact several of the wound actually appears to be showing some signs of improvement already in my pinion. There are a couple areas in the left leg in particular there likely gonna require some sharp debridement to help clear away some necrotic tissue and help with more sufficient healing. No fevers, chills, nausea, or vomiting noted at this time. 10/15/18 on evaluation today patient actually appears to be doing very well in regard to his bilateral lower extremities. He's been tolerating the dressing changes  without complication. Fortunately there  does not appear to be any evidence of active infection at this time which is great news. Overall I'm actually very pleased with how this has progressed in just one visits time. Readmission: 08/14/2020 upon evaluation today patient presents for re-evaluation here in our clinic. He is having issues with his left ankle region as well as his right toe and his right heel. He tells me that the toe and heel actually began as a area that was itching that he was scratching and then subsequently opened up into wounds. These may have been abscess areas I presume based on what I am seeing currently. With regard to his left ankle region he tells me this was a similar type occurrence although he does have venous stasis this very well may be more of a venous leg ulcer more than anything. Nonetheless I do believe that the patient would benefit from appropriate and aggressive wound care to try to help get things under better control here. He does have history of a stroke on the left side affecting him to some degree there that he is able to stand although he does have some residual weakness. Otherwise again the patient does have chronic venous insufficiency as previously noted. His arterial studies most recently obtained showed that he had an ABI on the right of 1.16 with a TBI of 0.52 and on the left and ABI of 1.14 with a TBI of 0.81. That was obtained on 06/19/2020. 08/28/2020 upon evaluation today patient appears to be doing decently well in regard to his wounds in general. He has been tolerating the dressing changes without complication. Fortunately there does not appear to be any signs of active infection which is great news. With that being said I think the [facility] is doing a good job I would recommend that we likely continue with that currently. 09/11/2020 upon evaluation today patient's wounds did not appear to be doing too poorly but again he is not really showing  signs of significant improvement with regard to any of the wounds on the right. None of them have Hydrofera Blue on them I am not exactly sure why this is not being followed as the facility did not contact us to let us know of any issues with obtaining dressings or otherwise. With that being said he is supposed to be using Hydrofera Blue on both of the wounds on the right foot as well as the ankle wound on the left side. 09/18/2020 upon evaluation today patient appears to be doing poorly with regard to his wounds. Again right now the left ankle in particular showed signs of extreme maceration. Apparently he was told by someone with staff at [facility] [place] they could not get the [facility]. With that being said this is something that is never been relayed to [place] one way or another. Also the patient subsequently has not supposed to have a border gauze dressing on. He should have an ABD pad and roll gauze to secure as this drains much too much just to have a border gauze dressing to cover. Nonetheless the fact that they are not using the appropriate dressing is directly causing deterioration of the left ankle wound it is significantly worse today compared to what it was previous. I did attempt to call [facility] while the patient was here I called three times and got no one to even pick up the phone. After this I had my for an office coordinator call and she was able to finally get through  and leave a message with the D ON as of dictation of this note which is roughly about an hour and a half later I still have not been able to speak with anyone at the facility. 09/25/2020 upon evaluation today patient actually showing signs of good improvement which is excellent news. He has been tolerating the dressing changes without complication. Fortunately there is no signs of active infection which is great news. No fevers, chills, nausea, vomiting, or diarrhea. I do feel like the facility has been  doing a much better job at taking care of him as far as the dressings are concerned. However the director of nursing never did call me back. 10/09/2020 upon evaluation today patient appears to be doing well with regard to his wound. The toe ulcer did require some debridement but the other 2 areas actually appear to be doing quite well. 10/19/2020 upon evaluation today patient actually appears to be doing very well in regard to his wounds. In fact the heel does appear to be completely healed. The toe is doing better in the medial ankle on the left is also doing better. Overall I think he is headed in the right direction. 10/26/2020 upon evaluation today patient appears to be doing well with regard to his wound. He is showing signs of improvement which is great news and overall I am very pleased with where things stand today. No fevers, chills, nausea, vomiting, or diarrhea. 11/02/2020 upon evaluation today patient appears to be doing well with regard to his wounds. He has been tolerating the dressing changes without complication overall I am extremely pleased with where things stand today. He in regard to the toe is almost completely healed and the medial [facility] ([id number]) ankle on the left is doing much better. 11/09/2020 upon evaluation today patient appears to be doing a little poorly in regard to his left medial ankle ulcer. Fortunately there does not appear to be any signs of systemic infection but unfortunately locally he does appear to be infected in fact he has blue-green drainage consistent with Pseudomonas. Objective Constitutional Well-nourished and well-hydrated in no acute distress. Vitals Time Taken: 10:43 AM, Height: 67 in, Weight: 160 lbs, BMI: 25.1, Temperature: 98.2 F, Pulse: 84 bpm, Respiratory Rate: 18 breaths/min, Blood Pressure: 106/74 mmHg. Respiratory normal breathing without difficulty. Psychiatric this patient is able to make decisions and demonstrates good  insight into disease process. Alert and Oriented x 3. pleasant and cooperative. General Notes: Patient has a blue-green drainage from the left medial ankle region. With that being said I do think that we can go ahead and continue with the alginate dressings although I think that we do need to get him on an antibiotic as well a wound culture was also taken from the site for verification. Integumentary (Hair, Skin) Wound #6 status is Open. Original cause of wound was Gradually Appeared. The date acquired was: 03/28/2020. The wound has been in treatment 12 weeks. The wound is located on the Right Toe Second. The wound measures 0.6cm length x 0.2cm width x 0.1cm depth; 0.094cm^2 area and 0.009cm^3 volume. There is Fat Layer (Subcutaneous Tissue) exposed. There is no tunneling or undermining noted. There is a none present amount of drainage noted. There is small (1-33%) pink granulation within the wound bed. There is a large (67-100%) amount of necrotic tissue within the wound bed including Eschar. Wound #8 status is Open. Original cause of wound was Gradually Appeared. The date acquired was: 07/12/2019. The wound has been in treatment  12 weeks. The wound is located on the Left,Medial Ankle. The wound measures 4.5cm length x 2.7cm width x 0.1cm depth; 9.543cm^2 area and 0.954cm^3 volume. There is Fat Layer (Subcutaneous Tissue) exposed. There is no tunneling or undermining noted. There is a large amount of purulent drainage noted. The wound margin is flat and intact. There is small (1-33%) red, pink granulation within the wound bed. There is a large (67-100%) amount of necrotic tissue within the wound bed including Adherent Slough. 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 Laboratory ordered were: Wound culture routine - left medial ankle non healing wounds , green drainage , redness, pain WOUND #6: - Toe Second Wound Laterality: Right Cleanser: Normal Saline 1 x Per Day/30 Days Discharge Instructions: Wash your hands with soap and water. Remove old dressing, discard into plastic bag and place into trash. LEMOINE, GOYNE ([id number]) Cleanse the wound with Normal Saline prior to applying a clean dressing using gauze sponges, not tissues or cotton balls. Do not scrub or use excessive force. Pat dry using gauze sponges, not tissue or cotton balls. Primary Dressing: Silvercel Small 2x2 (in/in) (Generic) 1 x Per Day/30 Days Discharge Instructions: Apply Silvercel Small 2x2 (in/in) as instructed Secondary Dressing: Coverlet Latex-Free Fabric Adhesive Dressings 1 x Per Day/30 Days Discharge Instructions: Secure just the wounded toe WOUND #8: - Ankle Wound Laterality: Left, Medial Cleanser: Normal Saline 1 x Per Day/30 Days Discharge Instructions: Wash your hands with soap and water. Remove old dressing, discard into plastic bag and place into trash. Cleanse the wound with Normal Saline prior to applying a clean dressing using gauze sponges, not tissues or cotton balls. Do not scrub or use excessive force. Pat dry using gauze sponges, not tissue or cotton balls. Peri-Wound Care: Desitin Maximum Strength Ointment 4 (oz) 1 x Per Day/30 Days Discharge Instructions: Zinc periwound Primary Dressing: Silvercel Small 2x2 (in/in) 1 x Per Day/30 Days Discharge Instructions: Apply Silvercel Small 2x2 (in/in) as instructed Secondary Dressing: ABD Pad 5x9 (in/in) 1 x Per Day/30 Days Discharge Instructions: Cover with ABD pad Secondary Dressing: Kerlix 4.5 x 4.1 (in/yd) 1 x Per Day/30 Days Discharge  Instructions: Apply Kerlix 4.5 x 4.1 (in/yd) as instructed Secured With: 69M Medipore H Soft Cloth Surgical Tape, 2x2 (in/yd) 1 x Per Day/30 Days 1. Would recommend currently that we going continue with the wound care measures as before with the silver alginate I think that still the appropriate thing to do on the wounds. 2. I am also can recommend that we have the patient continue to utilize the roll gauze to secure in place on the left ankle especially when using a Band-Aid on the right toe. 3. I did obtain a wound culture today also started the patient on an antibiotic, Cipro, we will see how this does. We will see patient back for reevaluation in 1 week here in the clinic. If anything worsens or changes patient will contact our office for additional recommendations. Electronic Signature(s) Signed: 11/09/2020 2:58:29 PM By: Worthy Keeler PA-C Entered By: Worthy Keeler on 11/09/2020 14:58:29 Chad [name] ([id number]) -------------------------------------------------------------------------------- SuperBill Details Patient Name: Chad [name] Date of Service: 11/09/2020 Medical Record Number: [id number] Patient Account Number:  [id number] Date of Birth/Sex: 1960/11/04 (60 y.o. 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 part of right foot with fat layer exposed 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 side Facility Procedures CPT4 Code: 30104045 Description: 99213 - WOUND CARE VISIT-LEV 3 EST  PT Modifier: Quantity: 1 Physician Procedures CPT4 Code: 9136859 Description: 99213 - WC PHYS LEVEL 3 - EST PT Modifier: Quantity: 1 CPT4 Code: Description: ICD-10 Diagnosis Description I87.2 Venous insufficiency (chronic) (peripheral) 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

## 2020-11-12 NOTE — Progress Notes (Signed)
CACHE, BILLS ([id number]) Visit Report for 11/09/2020 Arrival Information Details Patient Name: Chad [name], Chad [name] Date of Service: 11/09/2020 10:30 AM Medical Record Number: [id number] Patient Account Number: [id number] Date of Birth/Sex: 1961/07/16 (60 y.o. M) Treating RN: Chad [name] Primary Care Chad [name]: Chad [name] Other Clinician: Referring Chad [name]: Chad [name] Treating Chad [name]/Extender: Chad [name] in Treatment: 12 Visit Information History Since Last Visit Added or deleted any medications: No Patient Arrived: Wheel Chair Had a fall or experienced change in No Arrival Time: 10:45 activities of daily living that may affect Accompanied By: self risk of falls: Transfer Assistance: None Hospitalized since last visit: No Patient Identification Verified: Yes Pain Present Now: No Secondary Verification Process Completed: Yes Patient Requires Transmission-Based No Precautions: Patient Has Alerts: Yes Patient Alerts: Patient on Blood Thinner ABI right 1.16 ABI left 1.14 Electronic Signature(s) Signed: 11/09/2020 4:49:43 PM By: Chad [name] Entered By: Chad [name] on 11/09/2020 10:45:55 Chad [name] ([id number]) -------------------------------------------------------------------------------- Clinic Level of Care Assessment Details Patient Name: Chad [name] Date of Service: 11/09/2020 10:30 AM Medical Record Number: [id number] Patient Account Number: [id number] Date of Birth/Sex: 11-13-1960 (60 y.o. M) Treating RN: Chad [name] Primary Care Tinsley Lomas: Chad [name] Other Clinician: Referring Chad [name]: Chad [name] Treating Whitni Pasquini/Extender: Chad [name] in Treatment: 12 Clinic Level of Care Assessment Items TOOL 4 Quantity Score X - Use when only an EandM is performed on FOLLOW-UP visit 1 0 ASSESSMENTS - Nursing Assessment / Reassessment X - Reassessment of Co-morbidities (includes updates in patient status) 1 10 X- 1 5 Reassessment of Adherence to  Treatment Plan ASSESSMENTS - Wound and Skin Assessment / Reassessment []  - Simple Wound Assessment / Reassessment - one wound 0 X- 2 5 Complex Wound Assessment / Reassessment - multiple wounds []  - 0 Dermatologic / Skin Assessment (not related to wound area) ASSESSMENTS - Focused Assessment []  - Circumferential Edema Measurements - multi extremities 0 []  - 0 Nutritional Assessment / Counseling / Intervention []  - 0 Lower Extremity Assessment (monofilament, tuning fork, pulses) []  - 0 Peripheral Arterial Disease Assessment (using hand held doppler) ASSESSMENTS - Ostomy and/or Continence Assessment and Care []  - Incontinence Assessment and Management 0 []  - 0 Ostomy Care Assessment and Management (repouching, etc.) PROCESS - Coordination of Care X - Simple Patient / Family Education for ongoing care 1 15 []  - 0 Complex (extensive) Patient / Family Education for ongoing care []  - 0 Staff obtains Programmer, systems, Records, Test Results / Process Orders []  - 0 Staff telephones HHA, Nursing Homes / Clarify orders / etc []  - 0 Routine Transfer to another Facility (non-emergent condition) []  - 0 Routine Hospital Admission (non-emergent condition) []  - 0 New Admissions / Biomedical engineer / Ordering NPWT, Apligraf, etc. []  - 0 Emergency Hospital Admission (emergent condition) X- 1 10 Simple Discharge Coordination []  - 0 Complex (extensive) Discharge Coordination PROCESS - Special Needs []  - Pediatric / Minor Patient Management 0 []  - 0 Isolation Patient Management []  - 0 Hearing / Language / Visual special needs []  - 0 Assessment of Community assistance (transportation, D/C planning, etc.) []  - 0 Additional assistance / Altered mentation []  - 0 Support Surface(s) Assessment (bed, cushion, seat, etc.) INTERVENTIONS - Wound Cleansing / Measurement Chad [name], Chad [name] ([id number]) []  - 0 Simple Wound Cleansing - one wound X- 2 5 Complex Wound Cleansing - multiple wounds X- 1  5 Wound Imaging (photographs - any number of wounds) []  - 0 Wound Tracing (instead of photographs) []  - 0 Simple Wound Measurement - one wound X- 2 5 Complex Wound  Measurement - multiple wounds INTERVENTIONS - Wound Dressings X - Small Wound Dressing one or multiple wounds 2 10 []  - 0 Medium Wound Dressing one or multiple wounds []  - 0 Large Wound Dressing one or multiple wounds X- 1 5 Application of Medications - topical []  - 0 Application of Medications - injection INTERVENTIONS - Miscellaneous []  - External ear exam 0 []  - 0 Specimen Collection (cultures, biopsies, blood, body fluids, etc.) []  - 0 Specimen(s) / Culture(s) sent or taken to Lab for analysis []  - 0 Patient Transfer (multiple staff / Civil Service fast streamer / Similar devices) []  - 0 Simple Staple / Suture removal (25 or less) []  - 0 Complex Staple / Suture removal (26 or more) []  - 0 Hypo / Hyperglycemic Management (close monitor of Blood Glucose) []  - 0 Ankle / Brachial Index (ABI) - do not check if billed separately X- 1 5 Vital Signs Has the patient been seen at the hospital within the last three years: Yes Total Score: 105 Level Of Care: New/Established - Level 3 Electronic Signature(s) Signed: 11/12/2020 7:55:11 AM By: Chad Coria RN Entered By: Chad [name] on 11/09/2020 12:51:39 Chad [name] ([id number]) -------------------------------------------------------------------------------- Encounter Discharge Information Details Patient Name: Chad [name] Date of Service: 11/09/2020 10:30 AM Medical Record Number: [id number] Patient Account Number: [id number] Date of Birth/Sex: Dec 26, 1960 (60 y.o. M) Treating RN: Chad [name] Primary Care Bailea Beed: Chad [name] Other Clinician: Referring Chad Saxer: Chad [name] Treating Laneah Luft/Extender: Chad [name] in Treatment: 12 Encounter Discharge Information Items Discharge Condition: Stable Ambulatory Status: Wheelchair Discharge Destination: Skilled Nursing  Facility Telephoned: No Orders Sent: Yes Transportation: Other Accompanied By: self Schedule Follow-up Appointment: Yes Clinical Summary of Care: Electronic Signature(s) Signed: 11/09/2020 1:17:09 PM By: Chad [name] Entered By: Chad [name] on 11/09/2020 11:59:48 Chad [name] ([id number]) -------------------------------------------------------------------------------- Lower Extremity Assessment Details Patient Name: Chad [name] Date of Service: 11/09/2020 10:30 AM Medical Record Number: [id number] Patient Account Number: [id number] Date of Birth/Sex: 08/04/1960 (60 y.o. M) Treating RN: Chad [name] Primary Care Noelle Hoogland: Chad [name] Other Clinician: Referring Imara Standiford: Chad [name] Treating Ennifer Harston/Extender: Chad [name] Weeks in Treatment: 12 Electronic Signature(s) Signed: 11/09/2020 4:49:43 PM By: Chad [name] Signed: 11/12/2020 7:55:11 AM By: Chad Coria RN Entered By: Chad [name] on 11/09/2020 10:55:44 Chad [name] ([id number]) -------------------------------------------------------------------------------- Multi Wound Chart Details Patient Name: Chad [name] Date of Service: 11/09/2020 10:30 AM Medical Record Number: [id number] Patient Account Number: [id number] Date of Birth/Sex: Feb 09, 1961 (60 y.o. M) Treating RN: Chad [name] Primary Care Nathali Vent: Chad [name] Other Clinician: Referring Lorma Heater: Chad [name] Treating Mariya Mottley/Extender: Chad [name] in Treatment: 12 Vital Signs Height(in): 48 Pulse(bpm): 27 Weight(lbs): 160 Blood Pressure(mmHg): 106/74 Body Mass Index(BMI): 25 Temperature(F): 98.2 Respiratory Rate(breaths/min): 18 Photos: [N/A:N/A] Wound Location: Right Toe Second Left, Medial Ankle N/A Wounding Event: Gradually Appeared Gradually Appeared N/A Primary Etiology: Abscess Venous Leg Ulcer N/A Comorbid History: Anemia, Chronic Obstructive Anemia, Chronic Obstructive N/A Pulmonary Disease (COPD), Pulmonary Disease  (COPD), Hepatitis C, Received Hepatitis C, Received Chemotherapy Chemotherapy Date Acquired: 03/28/2020 07/12/2019 N/A Weeks of Treatment: 12 12 N/A Wound Status: Open Open N/A Measurements L x W x D (cm) 0.6x0.2x0.1 4.5x2.7x0.1 N/A Area (cm) : 0.094 9.543 N/A Volume (cm) : 0.009 0.954 N/A % Reduction in Area: 92.20% -62.00% N/A % Reduction in Volume: 92.50% -62.00% N/A Classification: Full Thickness Without Exposed Full Thickness Without Exposed N/A Support Structures Support Structures Exudate Amount: None Present Large N/A Exudate Type: N/A Purulent N/A Exudate Color: N/A yellow, brown, green N/A Wound Margin: N/A Flat and Intact N/A Granulation Amount:  Small (1-33%) Small (1-33%) N/A Granulation Quality: Pink Red, Pink N/A Necrotic Amount: Large (67-100%) Large (67-100%) N/A Necrotic Tissue: [place] N/A Exposed Structures: Fat Layer (Subcutaneous Tissue): Fat Layer (Subcutaneous Tissue): N/A Yes Yes Fascia: No Fascia: No Tendon: No Tendon: No Muscle: No Muscle: No Joint: No Joint: No Bone: No Bone: No Epithelialization: None Small (1-33%) N/A Treatment Notes Electronic Signature(s) Signed: 11/12/2020 7:55:11 AM By: Chad Coria RN Entered By: Chad [name] on 11/09/2020 11:45:38 Chad [name] ([id number]) Chad [name] ([id number]) -------------------------------------------------------------------------------- Multi-Disciplinary Care Plan Details Patient Name: Chad [name] Date of Service: 11/09/2020 10:30 AM Medical Record Number: [id number] Patient Account Number: [id number] Date of Birth/Sex: [birth year] (60 y.o. M) Treating RN: Chad [name] Primary Care Daeton Kluth: Chad [name] Other Clinician: Referring Chad [name]: Chad [name] Treating Shauna Bodkins/Extender: Chad [name] Weeks in Treatment: 12 Active Inactive Electronic Signature(s) Signed: 11/12/2020 7:55:11 AM By: Chad Coria RN Entered By: Chad [name] on 11/09/2020 11:45:30 Chad [name]  ([id number]) -------------------------------------------------------------------------------- Pain Assessment Details Patient Name: Chad [name] Date of Service: 11/09/2020 10:30 AM Medical Record Number: [id number] Patient Account Number: [id number] Date of Birth/Sex: 1961-02-15 (60 y.o. M) Treating RN: Chad [name] Primary Care Adelayde Minney: Chad [name] Other Clinician: Referring Shivonne Schwartzman: Chad [name] Treating Fajr Fife/Extender: Chad [name] in Treatment: 12 Active Problems Location of Pain Severity and Description of Pain Patient Has Paino No Site Locations Rate the pain. Current Pain Level: 0 Pain Management and Medication Current Pain Management: Electronic Signature(s) Signed: 11/09/2020 4:49:43 PM By: Chad [name] Signed: 11/12/2020 7:55:11 AM By: Chad Coria RN Entered By: Chad [name] on 11/09/2020 10:46:51 Chad [name] ([id number]) -------------------------------------------------------------------------------- Patient/Caregiver Education Details Patient Name: Chad [name] Date of Service: 11/09/2020 10:30 AM Medical Record Number: [id number] Patient Account Number: [id number] Date of Birth/Gender: January 07, 1961 (60 y.o. M) Treating RN: Chad [name] Primary Care Physician: Chad [name] Other Clinician: Referring Physician: Clayborn [name] Treating Physician/Extender: Chad [name] in Treatment: 12 Education Assessment Education Provided To: Patient Education Topics Provided Wound/Skin Impairment: Methods: Explain/Verbal Responses: State content correctly Electronic Signature(s) Signed: 11/12/2020 7:55:11 AM By: Chad Coria RN Entered By: Chad [name] on 11/09/2020 12:51:55 Chad [name] ([id number]) -------------------------------------------------------------------------------- Wound Assessment Details Patient Name: Chad [name] Date of Service: 11/09/2020 10:30 AM Medical Record Number: [id number] Patient Account Number: [id number] Date of  Birth/Sex: July 28, 1961 (60 y.o. M) Treating RN: Chad [name] Primary Care Amee Boothe: Chad [name] Other Clinician: Referring Lyndall Bellot: Chad [name] Treating Mayana Irigoyen/Extender: Chad [name] in Treatment: 12 Wound Status Wound Number: 6 Primary Abscess Etiology: Wound Location: Right Toe Second Wound Open Wounding Event: Gradually Appeared Status: Date Acquired: 03/28/2020 Comorbid Anemia, Chronic Obstructive Pulmonary Disease (COPD), Weeks Of Treatment: 12 History: Hepatitis C, Received Chemotherapy Clustered Wound: No Photos Wound Measurements Length: (cm) 0.6 Width: (cm) 0.2 Depth: (cm) 0.1 Area: (cm) 0.094 Volume: (cm) 0.009 % Reduction in Area: 92.2% % Reduction in Volume: 92.5% Epithelialization: None Tunneling: No Undermining: No Wound Description Classification: Full Thickness Without Exposed Support Structures Exudate Amount: None Present Foul Odor After Cleansing: No Slough/Fibrino No Wound Bed Granulation Amount: Small (1-33%) Exposed Structure Granulation Quality: Pink Fascia Exposed: No Necrotic Amount: Large (67-100%) Fat Layer (Subcutaneous Tissue) Exposed: Yes Necrotic Quality: Eschar Tendon Exposed: No Muscle Exposed: No Joint Exposed: No Bone Exposed: No Treatment Notes Wound #6 (Toe Second) Wound Laterality: Right Cleanser Normal Saline Discharge Instruction: Wash your hands with soap and water. Remove old dressing, discard into plastic bag and place into trash. Cleanse the wound with Normal Saline prior to applying a clean dressing using gauze sponges, not tissues or cotton  balls. Do not scrub or use excessive force. Pat dry using gauze sponges, not tissue or cotton balls. Peri-Wound Care KAYEN, GRABEL ([id number]) Topical Primary Dressing Silvercel Small 2x2 (in/in) Discharge Instruction: Apply Silvercel Small 2x2 (in/in) as instructed Secondary Dressing Coverlet Latex-Free Fabric Adhesive Dressings Discharge Instruction: Secure just  the wounded toe Secured With Compression Wrap Compression Stockings Add-Ons Electronic Signature(s) Signed: 11/09/2020 4:49:43 PM By: Chad [name] Signed: 11/12/2020 7:55:11 AM By: Chad Coria RN Entered By: Chad [name] on 11/09/2020 10:54:50 Chad [name] ([id number]) -------------------------------------------------------------------------------- Wound Assessment Details Patient Name: Chad [name] Date of Service: 11/09/2020 10:30 AM Medical Record Number: [id number] Patient Account Number: [id number] Date of Birth/Sex: 30-Oct-1960 (60 y.o. M) Treating RN: Chad [name] Primary Care Jaden Abreu: Chad [name] Other Clinician: Referring Ladean Steinmeyer: Chad [name] Treating Christo Hain/Extender: Chad [name] Weeks in Treatment: 12 Wound Status Wound Number: 8 Primary Venous Leg Ulcer Etiology: Wound Location: Left, Medial Ankle Wound Open Wounding Event: Gradually Appeared Status: Date Acquired: 07/12/2019 Comorbid Anemia, Chronic Obstructive Pulmonary Disease (COPD), Weeks Of Treatment: 12 History: Hepatitis C, Received Chemotherapy Clustered Wound: No Photos Wound Measurements Length: (cm) 4.5 Width: (cm) 2.7 Depth: (cm) 0.1 Area: (cm) 9.543 Volume: (cm) 0.954 % Reduction in Area: -62% % Reduction in Volume: -62% Epithelialization: Small (1-33%) Tunneling: No Undermining: No Wound Description Classification: Full Thickness Without Exposed Support Structures Wound Margin: Flat and Intact Exudate Amount: Large Exudate Type: Purulent Exudate Color: yellow, brown, green Foul Odor After Cleansing: No Slough/Fibrino Yes Wound Bed Granulation Amount: Small (1-33%) Exposed Structure Granulation Quality: Red, Pink Fascia Exposed: No Necrotic Amount: Large (67-100%) Fat Layer (Subcutaneous Tissue) Exposed: Yes Necrotic Quality: Adherent Slough Tendon Exposed: No Muscle Exposed: No Joint Exposed: No Bone Exposed: No Treatment Notes Wound #8 (Ankle) Wound Laterality:  Left, Medial Cleanser Normal Saline Discharge Instruction: Wash your hands with soap and water. Remove old dressing, discard into plastic bag and place into trash. Cleanse the wound with Normal Saline prior to applying a clean dressing using gauze sponges, not tissues or cotton balls. Do not scrub or use excessive force. Pat dry using gauze sponges, not tissue or cotton balls. MATHHEW, BUYSSE ([id number]) Peri-Wound Care Desitin Maximum Strength Ointment 4 (oz) Discharge Instruction: Zinc periwound Topical Primary Dressing Silvercel Small 2x2 (in/in) Discharge Instruction: Apply Silvercel Small 2x2 (in/in) as instructed Secondary Dressing ABD Pad 5x9 (in/in) Discharge Instruction: Cover with ABD pad Kerlix 4.5 x 4.1 (in/yd) Discharge Instruction: Apply Kerlix 4.5 x 4.1 (in/yd) as instructed Secured With 36M [place] Surgical Tape, 2x2 (in/yd) Compression Wrap Compression Stockings Add-Ons Electronic Signature(s) Signed: 11/09/2020 4:49:43 PM By: Chad [name] Signed: 11/12/2020 7:55:11 AM By: Chad Coria RN Entered By: Chad [name] on 11/09/2020 10:55:31 Chad [name] ([id number]) -------------------------------------------------------------------------------- [place] Details Patient Name: Chad [name] Date of Service: 11/09/2020 10:30 AM Medical Record Number: [id number] Patient Account Number: [id number] Date of Birth/Sex: December 31, 1960 (60 y.o. M) Treating RN: Chad [name] Primary Care Kingsley Herandez: Chad [name] Other Clinician: Referring Xoey Warmoth: Chad [name] Treating Lorianne Malbrough/Extender: Chad [name] in Treatment: 12 Vital Signs Time Taken: 10:43 Temperature (F): 98.2 Height (in): 67 Pulse (bpm): 84 Weight (lbs): 160 Respiratory Rate (breaths/min): 18 Body Mass Index (BMI): 25.1 Blood Pressure (mmHg): 106/74 Reference Range: 80 - 120 mg / dl Electronic Signature(s) Signed: 11/09/2020 4:49:43 PM By: Chad [name] Entered By: Chad [name] on  11/09/2020 10:46:44

## 2020-11-13 LAB — AEROBIC CULTURE W GRAM STAIN (SUPERFICIAL SPECIMEN): Gram Stain: NONE SEEN

## 2020-11-16 ENCOUNTER — Other Ambulatory Visit: Payer: Self-pay

## 2020-11-16 ENCOUNTER — Encounter: Payer: Medicaid Other | Admitting: Physician Assistant

## 2020-11-16 DIAGNOSIS — L97322 Non-pressure chronic ulcer of left ankle with fat layer exposed: Secondary | ICD-10-CM | POA: Diagnosis not present

## 2020-11-16 NOTE — Progress Notes (Addendum)
Chad [name], Chad [name] ([id number]) Visit Report for 11/16/2020 Chief Complaint Document Details Patient Name: Chad [name], Chad [name] Date of Service: 11/16/2020 11:00 AM Medical Record Number: [id number] Patient Account Number: [id number] Date of Birth/Sex: [birth year] (60 y.o. M) Treating RN: Carlene Coria Primary Care Provider: Clayborn Bigness Other Clinician: Referring Provider: Clayborn Bigness Treating Provider/Extender: Skipper Cliche in Treatment: 13 Information Obtained from: Patient Chief Complaint Left ankle and right foot and heel ulcers Electronic Signature(s) Signed: 11/16/2020 11:10:16 AM By: Worthy Keeler PA-C Entered By: Worthy Keeler on 11/16/2020 11:10:16 Chad [name] ([id number]) -------------------------------------------------------------------------------- HPI Details Patient Name: Chad [name] Date of Service: 11/16/2020 11:00 AM Medical Record Number: [id number] Patient Account Number: [id number] Date of Birth/Sex: 1961/01/05 (60 y.o. M) Treating RN: Carlene Coria Primary Care Provider: Clayborn Bigness Other Clinician: Referring Provider: Clayborn Bigness Treating Provider/Extender: Skipper Cliche in Treatment: 13 History of Present Illness HPI Description: 10/08/18 on evaluation today patient actually presents to our office for initial evaluation concerning wounds that he has of the bilateral lower extremities. 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 He also has bilateral venous stasis. He apparently has been homeless currently following discharge from the hospital apparently he has been placed at [facility] which is is a skilled nursing facility locally. Nonetheless fortunately he does not show any signs of infection at this time which is good news. In fact several of the wound actually appears to be showing some  signs of improvement already in my pinion. There are a couple areas in the left leg in particular there likely gonna require some sharp debridement to help clear away some necrotic tissue and help with more sufficient healing. No fevers, chills, nausea, or vomiting noted at this time. 10/15/18 on evaluation today patient actually appears to be doing very well in regard to his bilateral lower extremities. He's been tolerating the dressing changes without complication. Fortunately there does not appear to be any evidence of active infection at this time which is great news. Overall I'm actually very pleased with how this has progressed in just one visits time. Readmission: 08/14/2020 upon evaluation today patient presents for re-evaluation here in our clinic. He is having issues with his left ankle region as well as his right toe and his right heel. He tells me that the toe and heel actually began as a area that was itching that he was scratching and then subsequently opened up into wounds. These may have been abscess areas I presume based on what I am seeing currently. With regard to his left ankle region he tells me this was a similar type occurrence although he does have venous stasis this very well may be more of a venous leg ulcer more than anything. Nonetheless I do believe that the patient would benefit from appropriate and aggressive wound care to try to help get things under better control here. He does have history of a stroke on the left side affecting him to some degree there that he is able to stand although he does have some residual weakness. Otherwise again the patient does have chronic venous insufficiency as previously noted. His arterial studies most recently obtained showed that he had an ABI on the right of 1.16 with a TBI of 0.52 and on the left and ABI of 1.14 with a TBI of 0.81. That was obtained on 06/19/2020. 08/28/2020 upon evaluation today  patient appears to be doing decently  well in regard to his wounds in general. He has been tolerating the dressing changes without complication. Fortunately there does not appear to be any signs of active infection which is great news. With that being said I think the [facility] is doing a good job I would recommend that we likely continue with that currently. 09/11/2020 upon evaluation today patient's wounds did not appear to be doing too poorly but again he is not really showing signs of significant improvement with regard to any of the wounds on the right. None of them have Hydrofera Blue on them I am not exactly sure why this is not being followed as the facility did not contact us to let us know of any issues with obtaining dressings or otherwise. With that being said he is supposed to be using Hydrofera Blue on both of the wounds on the right foot as well as the ankle wound on the left side. 09/18/2020 upon evaluation today patient appears to be doing poorly with regard to his wounds. Again right now the left ankle in particular showed signs of extreme maceration. Apparently he was told by someone with staff at [facility] they could not get the [facility]. With that being said this is something that is never been relayed to [place] one way or another. Also the patient subsequently has not supposed to have a border gauze dressing on. He should have an ABD pad and roll gauze to secure as this drains much too much just to have a border gauze dressing to cover. Nonetheless the fact that they are not using the appropriate dressing is directly causing deterioration of the left ankle wound it is significantly worse today compared to what it was previous. I did attempt to call [facility] while the patient was here I called three times and got no one to even pick up the phone. After this I had my for an office coordinator call and she was able to finally get through and leave a message with the D ON as of dictation of this  note which is roughly about an hour and a half later I still have not been able to speak with anyone at the facility. 09/25/2020 upon evaluation today patient actually showing signs of good improvement which is excellent news. He has been tolerating the dressing changes without complication. Fortunately there is no signs of active infection which is great news. No fevers, chills, nausea, vomiting, or diarrhea. I do feel like the facility has been doing a much better job at taking care of him as far as the dressings are concerned. However the director of nursing never did call me back. 10/09/2020 upon evaluation today patient appears to be doing well with regard to his wound. The toe ulcer did require some debridement but the other 2 areas actually appear to be doing quite well. 10/19/2020 upon evaluation today patient actually appears to be doing very well in regard to his wounds. In fact the heel does appear to be completely healed. The toe is doing better in the medial ankle on the left is also doing better. Overall I think he is headed in the right direction. 10/26/2020 upon evaluation today patient appears to be doing well with regard to his wound. He is showing signs of improvement which is great news and overall I am very pleased with where things stand today. No fevers, chills, nausea, vomiting, or diarrhea. 11/02/2020 upon evaluation today patient appears to  be doing well with regard to his wounds. He has been tolerating the dressing changes without complication overall I am extremely pleased with where things stand today. He in regard to the toe is almost completely healed and the medial ankle on the left is doing much better. 11/09/2020 upon evaluation today patient appears to be doing a little poorly in regard to his left medial ankle ulcer. Fortunately there does not appear to be any signs of systemic infection but unfortunately locally he does appear to be infected in fact he has blue-green  drainage consistent with Pseudomonas. Chad [name], Chad [name] ([id number]) 11/16/2020 upon evaluation today patient appears to be doing well with regard to his wound. It actually appears to be doing better. I did place him on gentamicin cream since the Cipro was actually resistant even though he was positive for Pseudomonas on culture. Overall I think that he does seem to be doing better though I am unsure whether or not they have actually been putting the cream on. The patient is not sure that we did talk to the nurse directly and she was going to initiate that treatment. Fortunately there does not appear to be any signs of active infection at this time. No fevers, chills, nausea, vomiting, or diarrhea. Electronic Signature(s) Signed: 11/16/2020 1:01:29 PM By: Worthy Keeler PA-C Entered By: Worthy Keeler on 11/16/2020 13:01:28 Chad [name] ([id number]) -------------------------------------------------------------------------------- Physical Exam Details Patient Name: Chad [name] Date of Service: 11/16/2020 11:00 AM Medical Record Number: [id number] Patient Account Number: [id number] Date of Birth/Sex: [birth year] (60 y.o. M) Treating RN: Carlene Coria Primary Care Provider: Clayborn Bigness Other Clinician: Referring Provider: Clayborn Bigness Treating Provider/Extender: Skipper Cliche in Treatment: 29 Constitutional Well-nourished and well-hydrated in no acute distress. Respiratory normal breathing without difficulty. Psychiatric this patient is able to make decisions and demonstrates good insight into disease process. Alert and Oriented x 3. pleasant and cooperative. Notes Upon inspection patient's wound bed actually showed signs of good granulation and epithelization at this point. There does not appear to be any evidence of active infection which is great news at this point especially considering it was positive for Pseudomonas but seems to be doing much better. No fevers, chills, nausea,  vomiting, or diarrhea. Electronic Signature(s) Signed: 11/16/2020 1:01:45 PM By: Worthy Keeler PA-C Entered By: Worthy Keeler on 11/16/2020 13:01:44 Chad [name] ([id number]) -------------------------------------------------------------------------------- Physician Orders Details Patient Name: Chad [name] Date of Service: 11/16/2020 11:00 AM Medical Record Number: [id number] Patient Account Number: [id number] Date of Birth/Sex: [birth year] (60 y.o. 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 Wound Treatment Wound #6 - Toe Second Wound Laterality: Right Cleanser: Normal Saline 1 x Per Day/30 Days Discharge Instructions: Wash your hands with soap and water. Remove old dressing, discard into plastic bag and place into trash. Cleanse the wound with Normal Saline prior to applying a clean dressing using gauze sponges, not tissues or cotton balls. Do not scrub or use excessive force. Pat dry using gauze sponges, not  tissue or cotton balls. Primary Dressing: Silvercel Small 2x2 (in/in) (Generic) 1 x Per Day/30 Days Discharge Instructions: Apply Silvercel Small 2x2 (in/in) as instructed Secondary Dressing: Coverlet Latex-Free Fabric Adhesive Dressings 1 x Per Day/30 Days Discharge Instructions: Secure just the wounded toe Wound #8 - Ankle  Wound Laterality: Left, Medial Cleanser: Normal Saline 1 x Per Day/30 Days Discharge Instructions: Wash your hands with soap and water. Remove old dressing, discard into plastic bag and place into trash. Cleanse the wound with Normal Saline prior to applying a clean dressing using gauze sponges, not tissues or cotton balls. Do not scrub or use excessive force. Pat dry using gauze sponges, not tissue or cotton balls. Peri-Wound Care: Desitin Maximum Strength Ointment 4 (oz) 1 x Per Day/30 Days Discharge Instructions: Zinc periwound Topical: Gentamicin 1 x Per Day/30 Days Discharge Instructions: Apply to wound bed Primary Dressing: Silvercel Small 2x2 (in/in) 1 x Per Day/30 Days Discharge Instructions: Apply Silvercel Small 2x2 (in/in) as instructed Secondary Dressing: ABD Pad 5x9 (in/in) 1 x Per Day/30 Days Discharge Instructions: Cover with ABD pad Secondary Dressing: Kerlix 4.5 x 4.1 (in/yd) 1 x Per Day/30 Days Discharge Instructions: Apply Kerlix 4.5 x 4.1 (in/yd) as instructed Secured With: 77M Medipore H Soft Cloth Surgical Tape, 2x2 (in/yd) 1 x Per Day/30 Days Electronic Signature(s) Signed: 11/16/2020 1:09:49 PM By: Worthy Keeler PA-C Signed: 11/21/2020 3:51:10 PM By: Carlene Coria RN Chad [name] ([id number]) Entered By: Carlene Coria on 11/16/2020 11:44:24 Chad [name] ([id number]) -------------------------------------------------------------------------------- Problem List Details Patient Name: Chad [name] Date of Service: 11/16/2020 11:00 AM Medical Record Number: [id number] Patient Account Number: [id number] Date of Birth/Sex: [birth year] (60 y.o. M) Treating RN: Carlene Coria Primary Care Provider: Clayborn Bigness Other Clinician: Referring Provider: Clayborn Bigness Treating Provider/Extender: Skipper Cliche in Treatment: 13 Active Problems ICD-10 Encounter Code Description Active Date MDM Diagnosis I87.2 Venous insufficiency (chronic) (peripheral) 08/14/2020 No  Yes L97.322 Non-pressure chronic ulcer of left ankle with fat layer exposed 08/14/2020 No Yes L97.412 Non-pressure chronic ulcer of right heel and midfoot with fat layer 08/14/2020 No Yes exposed L97.512 Non-pressure chronic ulcer of other part of right foot with fat layer 08/14/2020 No Yes exposed 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 M) Treating RN: Carlene Coria Primary Care Provider: Clayborn Bigness Other Clinician: Referring Provider: Clayborn Bigness Treating Provider/Extender: Skipper Cliche in Treatment: 13 Subjective Chief Complaint Information obtained from Patient Left ankle and right foot and heel ulcers History of Present Illness (HPI) 10/08/18 on evaluation today patient actually presents to our office for initial evaluation concerning wounds that he has of the bilateral lower extremities. 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 He also has bilateral venous stasis. He apparently has been homeless currently following discharge from the hospital apparently he has been placed at [facility] which is is a skilled nursing facility locally. Nonetheless fortunately he does not show any signs of infection at this time which is good news. In fact several of the wound actually appears to  be showing some signs of improvement already in my pinion. There are a couple areas in the left leg in particular there likely gonna require some sharp debridement to help clear away some necrotic tissue and help with more sufficient healing. No fevers, chills, nausea, or vomiting noted at this time. 10/15/18 on evaluation today patient actually appears to be doing very well in regard to his bilateral lower extremities. He's been tolerating the dressing changes without complication. Fortunately there does not appear to be any evidence of active infection at this time which is great news. Overall I'm actually very pleased with how this has progressed in just one visits time. Readmission: 08/14/2020 upon evaluation today patient presents for re-evaluation here in our clinic. He is having issues with his left ankle region as well as his right toe and his right heel. He tells me that the toe and heel actually began as a area that was itching that he was scratching and then subsequently opened up into wounds. These may have been abscess areas I presume based on what I am seeing currently. With regard to his left ankle region he tells me this was a similar type occurrence although he does have venous stasis this very well may be more of a venous leg ulcer more than anything. Nonetheless I do believe that the patient would benefit from appropriate and aggressive wound care to try to help get things under better control here. He does have history of a stroke on the left side affecting him to some degree there that he is able to stand although he does have some residual weakness. Otherwise again the patient does have chronic venous insufficiency as previously noted. His arterial studies most recently obtained showed that he had an ABI on the right of 1.16 with a TBI of 0.52 and on the left and ABI of 1.14 with a TBI of 0.81. That was obtained on 06/19/2020. 08/28/2020 upon evaluation today patient appears to be  doing decently well in regard to his wounds in general. He has been tolerating the dressing changes without complication. Fortunately there does not appear to be any signs of active infection which is great news. With that being said I think the [facility] is doing a good job I would recommend that we likely continue with that currently. 09/11/2020 upon evaluation today patient's wounds did not appear to be doing too poorly but again he is not really showing signs of significant improvement with regard to any of the wounds on the right. None of them have Hydrofera Blue on them I am not exactly sure why this is not being followed as the facility did not contact us to let us know of any issues with obtaining dressings or otherwise. With that being said he is supposed to be using Hydrofera Blue on both of the wounds on the right foot as well as the ankle wound on the left side. 09/18/2020 upon evaluation today patient appears to be doing poorly with regard to his wounds. Again right now the left ankle in particular showed signs of extreme maceration. Apparently he was told by someone with staff at [facility] they could not get the [facility]. With that being said this is something that is never been relayed to [place] one way or another. Also the patient subsequently has not supposed to have a border gauze dressing on. He should have an ABD pad and roll gauze to secure as this drains much too much just to have a  border gauze dressing to cover. Nonetheless the fact that they are not using the appropriate dressing is directly causing deterioration of the left ankle wound it is significantly worse today compared to what it was previous. I did attempt to call [facility] while the patient was here I called three times and got no one to even pick up the phone. After this I had my for an office coordinator call and she was able to finally get through and leave a message with the D ON as of  dictation of this note which is roughly about an hour and a half later I still have not been able to speak with anyone at the facility. 09/25/2020 upon evaluation today patient actually showing signs of good improvement which is excellent news. He has been tolerating the dressing changes without complication. Fortunately there is no signs of active infection which is great news. No fevers, chills, nausea, vomiting, or diarrhea. I do feel like the facility has been doing a much better job at taking care of him as far as the dressings are concerned. However the director of nursing never did call me back. 10/09/2020 upon evaluation today patient appears to be doing well with regard to his wound. The toe ulcer did require some debridement but the other 2 areas actually appear to be doing quite well. 10/19/2020 upon evaluation today patient actually appears to be doing very well in regard to his wounds. In fact the heel does appear to be completely healed. The toe is doing better in the medial ankle on the left is also doing better. Overall I think he is headed in the right direction. 10/26/2020 upon evaluation today patient appears to be doing well with regard to his wound. He is showing signs of improvement which is great news and overall I am very pleased with where things stand today. No fevers, chills, nausea, vomiting, or diarrhea. 11/02/2020 upon evaluation today patient appears to be doing well with regard to his wounds. He has been tolerating the dressing changes without complication overall I am extremely pleased with where things stand today. He in regard to the toe is almost completely healed and the medial [facility], Chad [name] ([id number]) ankle on the left is doing much better. 11/09/2020 upon evaluation today patient appears to be doing a little poorly in regard to his left medial ankle ulcer. Fortunately there does not appear to be any signs of systemic infection but unfortunately locally he does  appear to be infected in fact he has blue-green drainage consistent with Pseudomonas. 11/16/2020 upon evaluation today patient appears to be doing well with regard to his wound. It actually appears to be doing better. I did place him on gentamicin cream since the Cipro was actually resistant even though he was positive for Pseudomonas on culture. Overall I think that he does seem to be doing better though I am unsure whether or not they have actually been putting the cream on. The patient is not sure that we did talk to the nurse directly and she was going to initiate that treatment. Fortunately there does not appear to be any signs of active infection at this time. No fevers, chills, nausea, vomiting, or diarrhea. Objective Constitutional Well-nourished and well-hydrated in no acute distress. Vitals Time Taken: 11:23 AM, Height: 67 in, Weight: 160 lbs, BMI: 25.1, Temperature: 98.2 F, Pulse: 74 bpm, Respiratory Rate: 16 breaths/min, Blood Pressure: 113/63 mmHg. Respiratory normal breathing without difficulty. Psychiatric this patient is able to make decisions and demonstrates good  insight into disease process. Alert and Oriented x 3. pleasant and cooperative. General Notes: Upon inspection patient's wound bed actually showed signs of good granulation and epithelization at this point. There does not appear to be any evidence of active infection which is great news at this point especially considering it was positive for Pseudomonas but seems to be doing much better. No fevers, chills, nausea, vomiting, or diarrhea. Integumentary (Hair, Skin) Wound #6 status is Open. Original cause of wound was Gradually Appeared. The date acquired was: 03/28/2020. The wound has been in treatment 13 weeks. The wound is located on the Right Toe Second. The wound measures 0.1cm length x 0.1cm width x 0.1cm depth; 0.008cm^2 area and 0.001cm^3 volume. There is Fat Layer (Subcutaneous Tissue) exposed. There is no  tunneling or undermining noted. There is a none present amount of drainage noted. There is small (1-33%) pink granulation within the wound bed. There is a large (67-100%) amount of necrotic tissue within the wound bed including Eschar. Wound #8 status is Open. Original cause of wound was Gradually Appeared. The date acquired was: 07/12/2019. The wound has been in treatment 13 weeks. The wound is located on the Left,Medial Ankle. The wound measures 4.3cm length x 2.8cm width x 0.1cm depth; 9.456cm^2 area and 0.946cm^3 volume. There is Fat Layer (Subcutaneous Tissue) exposed. There is no tunneling or undermining noted. There is a large amount of purulent drainage noted. The wound margin is flat and intact. There is small (1-33%) red, pink granulation within the wound bed. There is a large (67-100%) amount of necrotic tissue within the wound bed including Adherent Slough. 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 WOUND #6: - Toe Second Wound Laterality: Right Cleanser: Normal Saline 1 x Per Day/30 Days Discharge Instructions: Wash your hands with soap and water. Remove old dressing, discard into plastic bag and place into trash. Cleanse the wound with Normal Saline prior to applying a clean dressing using gauze sponges, not tissues or cotton balls. Do not scrub or use excessive force. Pat dry using gauze sponges, not tissue or cotton balls. Primary Dressing: Silvercel Small 2x2 (in/in) (Generic) 1 x Per Day/30 Days Discharge Instructions: Apply Silvercel Small 2x2  (in/in) as instructed Secondary Dressing: Coverlet Latex-Free Fabric Adhesive Dressings 1 x Per Day/30 Days Discharge Instructions: Secure just the wounded toe WOUND #8: - Ankle Wound Laterality: Left, Medial Cleanser: Normal Saline 1 x Per Day/30 Days Discharge Instructions: Wash your hands with soap and water. Remove old dressing, discard into plastic bag and place into trash. Cleanse the wound with Normal Saline prior to applying a clean dressing using gauze sponges, not tissues or cotton balls. Do not scrub or use excessive force. Pat dry using gauze sponges, not tissue or cotton balls. Peri-Wound Care: Desitin Maximum Strength Ointment 4 (oz) 1 x Per Day/30 Days Discharge Instructions: Zinc periwound Topical: Gentamicin 1 x Per Day/30 Days Discharge Instructions: Apply to wound bed Primary Dressing: Silvercel Small 2x2 (in/in) 1 x Per Day/30 Days Discharge Instructions: Apply Silvercel Small 2x2 (in/in) as instructed Secondary Dressing: ABD Pad 5x9 (in/in) 1 x Per Day/30 Days Discharge Instructions: Cover with ABD pad Secondary Dressing: Kerlix 4.5 x  4.1 (in/yd) 1 x Per Day/30 Days Discharge Instructions: Apply Kerlix 4.5 x 4.1 (in/yd) as instructed Secured With: 45M Medipore H Soft Cloth Surgical Tape, 2x2 (in/yd) 1 x Per Day/30 Days 1. Would recommend currently that we going continue with the wound care measures as before and the patient is in agreement with the plan. This includes the use of the silver alginate dressing. The toe ulcer is really about completely healed I think to be done by next week. In regard to the ankle region this I think is doing better I do think we need to continue with gentamicin cream which I think would be absolutely appropriate. 2. I am also can recommend that we have the patient going continue with the ABD and roll gauze to secure in place to be changed daily on the ankle area this seems to be doing a great job. 3. I am also can recommend that the patient  continue with monitoring for any signs of worsening with regard to pain. We will see patient back for reevaluation in 1 week here in the clinic. If anything worsens or changes patient will contact our office for additional recommendations. Electronic Signature(s) Signed: 11/16/2020 1:02:39 PM By: Worthy Keeler PA-C Entered By: Worthy Keeler on 11/16/2020 13:02:39 Chad [name] ([id number]) -------------------------------------------------------------------------------- SuperBill Details Patient Name: Chad [name] Date of Service: 11/16/2020 Medical Record Number: [id number] Patient Account Number: [id number] Date of Birth/Sex: 05/24/1961 (60 y.o. M) Treating RN: Carlene Coria Primary Care Provider: Clayborn Bigness Other Clinician: Referring Provider: Clayborn Bigness Treating Provider/Extender: Skipper Cliche in Treatment: 13 Diagnosis Coding ICD-10 Codes Code Description I87.2 Venous insufficiency (chronic) (peripheral) L97.322 Non-pressure chronic ulcer of left ankle with fat layer exposed L97.412 Non-pressure chronic ulcer of right heel and midfoot with fat layer exposed L97.512 Non-pressure chronic ulcer of other part of right foot with fat layer exposed 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 side Facility Procedures CPT4 Code: 01093235 Description: 99214 - WOUND CARE VISIT-LEV 4 EST PT Modifier: Quantity: 1 Physician Procedures CPT4 Code: 5732202 Description: 99213 - WC PHYS LEVEL 3 - EST PT Modifier: Quantity: 1 CPT4 Code: Description: ICD-10 Diagnosis Description I87.2 Venous insufficiency (chronic) (peripheral) L97.322 Non-pressure chronic ulcer of left ankle with fat layer exposed L97.412 Non-pressure chronic ulcer of right heel and midfoot with fat layer exp L97.512  Non-pressure chronic ulcer of other part of right foot with fat layer e Modifier: osed xposed Quantity: Electronic Signature(s) Signed: 11/16/2020  1:02:52 PM By: Worthy Keeler PA-C Entered By: Worthy Keeler on 11/16/2020 13:02:51

## 2020-11-19 ENCOUNTER — Inpatient Hospital Stay (HOSPITAL_BASED_OUTPATIENT_CLINIC_OR_DEPARTMENT_OTHER): Payer: Medicaid Other | Admitting: Internal Medicine

## 2020-11-19 ENCOUNTER — Inpatient Hospital Stay: Payer: Medicaid Other

## 2020-11-19 ENCOUNTER — Telehealth: Payer: Self-pay | Admitting: *Deleted

## 2020-11-19 ENCOUNTER — Encounter: Payer: Self-pay | Admitting: Internal Medicine

## 2020-11-19 ENCOUNTER — Other Ambulatory Visit: Payer: Self-pay

## 2020-11-19 VITALS — BP 118/70 | HR 77 | Temp 97.0°F | Resp 18

## 2020-11-19 VITALS — BP 107/74 | HR 79 | Temp 96.8°F | Resp 16 | Ht 69.0 in

## 2020-11-19 DIAGNOSIS — D500 Iron deficiency anemia secondary to blood loss (chronic): Secondary | ICD-10-CM

## 2020-11-19 DIAGNOSIS — C661 Malignant neoplasm of right ureter: Secondary | ICD-10-CM

## 2020-11-19 DIAGNOSIS — Z5112 Encounter for antineoplastic immunotherapy: Secondary | ICD-10-CM | POA: Diagnosis not present

## 2020-11-19 DIAGNOSIS — Z1509 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: Secondary | ICD-10-CM

## 2020-11-19 DIAGNOSIS — Z515 Encounter for palliative care: Secondary | ICD-10-CM

## 2020-11-19 DIAGNOSIS — Z7189 Other specified counseling: Secondary | ICD-10-CM

## 2020-11-19 DIAGNOSIS — C182 Malignant neoplasm of ascending colon: Secondary | ICD-10-CM

## 2020-11-19 LAB — CBC WITH DIFFERENTIAL/PLATELET
Abs Immature Granulocytes: 0.01 10*3/uL (ref 0.00–0.07)
Basophils Absolute: 0.1 10*3/uL (ref 0.0–0.1)
Basophils Relative: 1 %
Eosinophils Absolute: 0.3 10*3/uL (ref 0.0–0.5)
Eosinophils Relative: 5 %
HCT: 39.7 % (ref 39.0–52.0)
Hemoglobin: 12.4 g/dL — ABNORMAL LOW (ref 13.0–17.0)
Immature Granulocytes: 0 %
Lymphocytes Relative: 24 %
Lymphs Abs: 1.6 10*3/uL (ref 0.7–4.0)
MCH: 24 pg — ABNORMAL LOW (ref 26.0–34.0)
MCHC: 31.2 g/dL (ref 30.0–36.0)
MCV: 76.8 fL — ABNORMAL LOW (ref 80.0–100.0)
Monocytes Absolute: 0.5 10*3/uL (ref 0.1–1.0)
Monocytes Relative: 8 %
Neutro Abs: 4.1 10*3/uL (ref 1.7–7.7)
Neutrophils Relative %: 62 %
Platelets: 193 10*3/uL (ref 150–400)
RBC: 5.17 MIL/uL (ref 4.22–5.81)
RDW: 19.6 % — ABNORMAL HIGH (ref 11.5–15.5)
WBC: 6.7 10*3/uL (ref 4.0–10.5)
nRBC: 0 % (ref 0.0–0.2)

## 2020-11-19 LAB — COMPREHENSIVE METABOLIC PANEL
ALT: 22 U/L (ref 0–44)
AST: 37 U/L (ref 15–41)
Albumin: 3.8 g/dL (ref 3.5–5.0)
Alkaline Phosphatase: 96 U/L (ref 38–126)
Anion gap: 11 (ref 5–15)
BUN: 23 mg/dL (ref 8–23)
CO2: 24 mmol/L (ref 22–32)
Calcium: 8.8 mg/dL — ABNORMAL LOW (ref 8.9–10.3)
Chloride: 101 mmol/L (ref 98–111)
Creatinine, Ser: 0.93 mg/dL (ref 0.61–1.24)
GFR, Estimated: >60 mL/min (ref >60)
Glucose, Bld: 89 mg/dL (ref 70–99)
Potassium: 4.1 mmol/L (ref 3.5–5.1)
Sodium: 136 mmol/L (ref 135–145)
Total Bilirubin: 0.7 mg/dL (ref 0.3–1.2)
Total Protein: 7.9 g/dL (ref 6.5–8.1)

## 2020-11-19 LAB — TSH: TSH: 2.106 u[IU]/mL (ref 0.350–4.500)

## 2020-11-19 MED ORDER — SODIUM CHLORIDE 0.9% FLUSH
10.0000 mL | Freq: Once | INTRAVENOUS | Status: AC
  Administered 2020-11-19: 10 mL via INTRAVENOUS
  Filled 2020-11-19: qty 10

## 2020-11-19 MED ORDER — HEPARIN SOD (PORK) LOCK FLUSH 100 UNIT/ML IV SOLN
500.0000 [IU] | Freq: Once | INTRAVENOUS | Status: AC | PRN
  Administered 2020-11-19: 500 [IU]
  Filled 2020-11-19: qty 5

## 2020-11-19 MED ORDER — SODIUM CHLORIDE 0.9 % IV SOLN
Freq: Once | INTRAVENOUS | Status: AC
  Filled 2020-11-19: qty 250

## 2020-11-19 MED ORDER — SODIUM CHLORIDE 0.9 % IV SOLN
200.0000 mg | Freq: Once | INTRAVENOUS | Status: AC
  Administered 2020-11-19: 200 mg via INTRAVENOUS
  Filled 2020-11-19: qty 8

## 2020-11-19 MED ORDER — IRON SUCROSE 20 MG/ML IV SOLN
200.0000 mg | Freq: Once | INTRAVENOUS | Status: AC
  Administered 2020-11-19: 200 mg via INTRAVENOUS
  Filled 2020-11-19: qty 10

## 2020-11-19 MED ORDER — HEPARIN SOD (PORK) LOCK FLUSH 100 UNIT/ML IV SOLN
INTRAVENOUS | Status: AC
  Filled 2020-11-19: qty 5

## 2020-11-19 NOTE — Progress Notes (Signed)
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  Is alone.  Mohit Zirbes 60 y.o.  male above history of stage IV-ureteral cancer/history of stage III colon cancer right side and multiple other comorbidities currently on Keytruda is here for follow-up.  The interim patient underwent colonoscopy-that showed a sigmoid lesion-biopsy concerning for malignancy.  Otherwise denies any abdominal pain.  Any nausea vomiting.  Chronic fatigue.  Chronic joint pains.   Review of Systems  Constitutional: Positive for malaise/fatigue. Negative for chills, diaphoresis, fever and weight loss.  HENT: Negative for nosebleeds and sore throat.   Eyes: Negative for double vision.   Respiratory: Positive for cough and shortness of breath. Negative for hemoptysis and wheezing.   Cardiovascular: Negative for chest pain, palpitations and orthopnea.  Gastrointestinal: Positive for constipation. Negative for abdominal pain, blood in stool, diarrhea, heartburn, melena, nausea and vomiting.  Genitourinary: Negative for dysuria, frequency and urgency.  Musculoskeletal: Positive for back pain and joint pain.  Skin: Negative.  Negative for itching and rash.  Neurological: Positive for focal weakness. Negative for dizziness, tingling, weakness and headaches.       Chronic left-sided weakness upper than lower extremity.  Endo/Heme/Allergies: Does not bruise/bleed easily.  Psychiatric/Behavioral: Negative for depression. The patient is not nervous/anxious and does not have insomnia.      MEDICAL HISTORY:  Past Medical History:  Diagnosis Date  . Anemia   . Anxiety   . ARF (acute respiratory failure) ([place])   . Bladder cancer ([place])   . COPD (chronic obstructive pulmonary disease) ([place])   . Depression   . Dysphagia   . Family history of colon cancer   . Family history of kidney cancer   . Family history of leukemia   . Family history of prostate cancer   . GERD (gastroesophageal reflux disease)   . Hepatitis    chronic hep c  . Hydronephrosis   . Hydronephrosis with ureteral stricture   . Hyperlipidemia   . Knee pain    Left  . Malignant neoplasm of colon ([place])   . Nerve pain   . Peripheral vascular disease ([place])   . Prostate cancer ([place])   . Stroke ([place])   . Urinary frequency   . Venous hypertension of both lower extremities     SURGICAL HISTORY:  Past Surgical History:  Procedure Laterality Date  . COLON SURGERY     En bloc extended right hemicolectomy 07/2017  . COLONOSCOPY WITH PROPOFOL N/A 11/06/2020   Procedure: COLONOSCOPY WITH PROPOFOL;  Surgeon: Jonathon Bellows, MD;  Location: [facility] ENDOSCOPY;  Service: Gastroenterology;  Laterality: N/A;  . CYSTOSCOPY W/  RETROGRADES Right 08/30/2018   Procedure: CYSTOSCOPY WITH RETROGRADE PYELOGRAM;  Surgeon: Hollice Espy, MD;  Location: [facility] ORS;  Service: Urology;  Laterality: Right;  . CYSTOSCOPY WITH STENT PLACEMENT Right 04/25/2018   Procedure: CYSTOSCOPY WITH STENT PLACEMENT;  Surgeon: Hollice Espy, MD;  Location: [facility] ORS;  Service: Urology;  Laterality: Right;  . CYSTOSCOPY WITH STENT PLACEMENT Right 08/30/2018   Procedure: [place] WITH STENT Exchange;  Surgeon: Hollice Espy, MD;  Location: [facility] ORS;  Service: Urology;  Laterality: Right;  . CYSTOSCOPY WITH STENT PLACEMENT Right 03/07/2019   Procedure: CYSTOSCOPY WITH STENT Exchange;  Surgeon: Hollice Espy, MD;  Location: [facility] ORS;  Service: Urology;  Laterality: Right;  . CYSTOSCOPY WITH STENT PLACEMENT Right 11/21/2019   Procedure: CYSTOSCOPY WITH STENT Exchange;  Surgeon: Hollice Espy, MD;  Location: [facility] ORS;  Service: Urology;  Laterality: Right;  . LOWER EXTREMITY ANGIOGRAPHY Left 05/23/2019   Procedure: LOWER EXTREMITY ANGIOGRAPHY;  Surgeon: Algernon Huxley, MD;  Location: [place] CV LAB;  Service: Cardiovascular;  Laterality: Left;  . LOWER EXTREMITY ANGIOGRAPHY Right 05/30/2019   Procedure: LOWER EXTREMITY ANGIOGRAPHY;  Surgeon: Algernon Huxley, MD;  Location: [place] CV LAB;  Service: Cardiovascular;  Laterality: Right;  . LOWER EXTREMITY ANGIOGRAPHY Right 02/13/2020   Procedure: LOWER EXTREMITY ANGIOGRAPHY;  Surgeon: Algernon Huxley, MD;  Location: [place] [facility] LAB;  Service: Cardiovascular;  Laterality: Right;  . LOWER EXTREMITY ANGIOGRAPHY Left 02/20/2020   Procedure: LOWER EXTREMITY ANGIOGRAPHY;  Surgeon: Algernon Huxley, MD;  Location: [place] CV LAB;  Service: Cardiovascular;  Laterality: Left;  . PORTA CATH INSERTION N/A 02/28/2019   Procedure: PORTA CATH INSERTION;  Surgeon: Algernon Huxley, MD;  Location: [place] [facility] LAB;  Service: Cardiovascular;  Laterality: N/A;  . tumor removed       SOCIAL HISTORY: Social  History   Socioeconomic History  . Marital status: Single    Spouse name: Not on file  . Number of children: Not on file  . Years of education: Not on file  . Highest education level: Not on file  Occupational History  . Not on file  Tobacco Use  . Smoking status: Current Every Day Smoker    Packs/day: 1.00    Types: Cigarettes  . Smokeless tobacco: Never Used  Vaping Use  . Vaping Use: Never used  Substance and Sexual Activity  . Alcohol use: Not Currently  . Drug use: Not Currently  . Sexual activity: Not Currently  Other Topics Concern  . Not on file  Social History Narrative    used to live [place]; moved  To [place]- end of April 2019; in Nursing home; 1pp/day; quit alcohol. 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        Family- dad- prostate ca [at 57y]; brother- 86 died of prostate cancer; brother- 31- no cancers [[place]]; sonGerald Stabs [];[name]-32y prostate ca [facility] [place]]; daughter- 59 [[place]]; another daughter 43 [[place]/addict]. will refer genetics counseling. Given MSI- abnormal; highly suspicious of Lynch syndrome.  Patient's son [name] aware of high possible lynch syndrome.   Social Determinants of Health   Financial Resource Strain: Not on file  Food Insecurity: Not on file  Transportation Needs: Not on file  Physical Activity: Not on file  Stress: Not on file  Social Connections: Not on file  Intimate Partner Violence: Not on file    FAMILY HISTORY: Family History  Problem Relation Age of Onset  . Prostate cancer Father 20  . Cancer Brother 53       unsure type  . Cancer Paternal Uncle        unsure type  . Cancer Maternal Grandmother        unsure type  . Cancer Paternal Grandmother        unsure type  . Kidney cancer Paternal Grandfather   . Cancer Other        unsure types  . Leukemia Son   . Cancer Son        other cancers, possibly colon    ALLERGIES:  is allergic to penicillins.  MEDICATIONS:  Current Outpatient Medications  Medication Sig  Dispense Refill  . acetaminophen (TYLENOL) 325 MG tablet Take 650 mg by mouth 3 (three) times daily.     Marland Kitchen aspirin EC 81 MG tablet Take 1 tablet (81 mg total) by mouth daily. 150 tablet 2  . Cholecalciferol (VITAMIN D) 50 MCG (2000 UT) CAPS Take by mouth.    . clopidogrel (PLAVIX) 75 MG tablet Take 1 tablet (75 mg total) by mouth daily. 30 tablet 11  . cyclobenzaprine (FLEXERIL) 10 MG tablet Take 10 mg by mouth at bedtime.    Marland Kitchen escitalopram (LEXAPRO) 5 MG tablet Take 5 mg by mouth daily.    Marland Kitchen gabapentin (NEURONTIN) 100 MG capsule Take 100 mg by mouth 3 (three) times daily.    . hydroxypropyl methylcellulose / hypromellose (ISOPTO TEARS / GONIOVISC) 2.5 % ophthalmic solution Place 1 drop into both eyes every 12 (twelve) hours.    . mirtazapine (REMERON) 7.5 MG tablet Take 7.5 mg by mouth at bedtime.    . Oxycodone HCl 10 MG TABS Take 10 mg by mouth every 6 (six) hours as needed for pain.    . pantoprazole (PROTONIX) 40 MG tablet Take 40 mg by mouth daily.    . Pollen Extracts (PROSTAT PO) Take 30 mLs by mouth.    . pravastatin (PRAVACHOL) 10 MG tablet Take 10 mg by mouth daily.    . RESTORE CALCIUM ALGINATE EX Apply topically. Note: Dressing located on left ankle. Facility cleaning wound "every day shift and applying wound cleanser and appication of calcium Alginate and cover with dry dressing and wrapped with Kerlix"    . atorvastatin (LIPITOR) 10 MG tablet Take 1 tablet (10 mg total) by mouth daily. 30 tablet 11   No current facility-administered medications for this visit.      Marland Kitchen  PHYSICAL EXAMINATION: ECOG PERFORMANCE STATUS: 1 - Symptomatic but completely ambulatory  Vitals:   11/19/20 0924  BP: 107/74  Pulse: 79  Resp: 16  Temp: (!) 96.8 F (36 C)  SpO2: 95%   There were no vitals filed for this visit.  Physical Exam Constitutional:      Comments: In wheelchair. Cachectic.  Appears older than his stated age.  Alone.  HENT:     Head: Normocephalic and atraumatic.      Mouth/Throat:     Pharynx: No oropharyngeal exudate.  Eyes:     Pupils: Pupils are equal, round, and reactive to light.  Cardiovascular:     Rate and Rhythm: Normal rate and regular rhythm.  Pulmonary:     Effort: No respiratory distress.  Breath sounds: No wheezing.  Abdominal:     General: Bowel sounds are normal. There is no distension.     Palpations: Abdomen is soft. There is no mass.     Tenderness: There is no abdominal tenderness. There is no guarding or rebound.  Musculoskeletal:        General: No tenderness. Normal range of motion.     Cervical back: Normal range of motion and neck supple.  Skin:    General: Skin is warm.     Comments: Bilateral lower extremity ulcerations noted.  Pulses intact bilaterally.  Neurological:     Mental Status: He is oriented to person, place, and time.     Comments: Sleepy but easily arousable.  Chronic weakness left upper and lower extremity.  Psychiatric:        Mood and Affect: Affect normal.      LABORATORY DATA:  I have reviewed the data as listed Lab Results  Component Value Date   WBC 6.7 11/19/2020   HGB 12.4 (L) 11/19/2020   HCT 39.7 11/19/2020   MCV 76.8 (L) 11/19/2020   PLT 193 11/19/2020   Recent Labs    03/08/20 0905 03/29/20 0824 04/19/20 0858 05/10/20 0915 10/08/20 0900 10/29/20 0816 11/19/20 0850  NA 137 139 134*   < > 136 137 136  K 4.4 4.1 3.8   < > 4.0 4.2 4.1  CL 103 104 101   < > 103 104 101  CO2 '25 24 26   ' < > '25 25 24  ' GLUCOSE 88 94 140*   < > 99 89 89  BUN '18 21 19   ' < > '23 19 23  ' CREATININE 0.85 0.76 0.92   < > 1.00 0.94 0.93  CALCIUM 8.8* 8.6* 8.4*   < > 8.7* 8.7* 8.8*  GFRNONAA >60 >60 >60   < > >60 >60 >60  GFRAA >60 >60 >60  --   --   --   --   PROT 8.0 7.7 7.8   < > 7.9 7.9 7.9  ALBUMIN 3.8 3.6 3.7   < > 3.6 3.8 3.8  AST 32 28 33   < > 37 35 37  ALT '26 20 24   ' < > '20 20 22  ' ALKPHOS 127* 118 115   < > 109 103 96  BILITOT 0.6 0.4 0.4   < > 0.6 0.5 0.7   < > = values in this interval  not displayed.    RADIOGRAPHIC STUDIES: I have personally reviewed the radiological images as listed and agreed with the findings in the report. No results found.  ASSESSMENT & PLAN:   Ureteral cancer, right ([place]) # High-grade urothelial cancer/cytology; likely of the right renal pelvis /upper ureter. JAN 5th, 2022- CT findings worrisome for progressive right renal pelvis and ureteral tumor and mass suspicious for colonic neoplasm involving the descending colon sigmoid colon junction region [see below].  Mild soft tissue thickening around the distal right ureter could reflect ureteral tumor; FEB 2022-  RIGHT renal pelvis and bladder are not evaluable by FDG PET imaging due to high activity of radiotracer within the urine. STABLE; but see below re: sigmoid mass/colon cancer  # proceed with  [place] today; Labs today reviewed; Labs today reviewed;  acceptable for treatment today.   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  Options include surgery vs-continue surveillance given his multiple comorbidities /stage IV  ureteral cancer. Referral to Dr.Byrnett.  Discussed with Dr. Bary Castilla.  # Iron deficiency anemia-hemoglobin 11.5; February 2022 ron studies/ferritin-LOW; plan Venofer with infusions;  STABLE.   # Bilateral LE ulcers-s/p wound care evaluation; s/p PTCA with Dr.Dew. bil Arterial Dopp-wnl; STABLE [Dr.]  *vac #DISPOSITION: # referral to Dr.Byrnett re: colon cancer #  keytruda/venofer today;  # follow up in 4 weeks-;MD labs-cbc/cmp;  Keytruda; venofer- Dr.B  # 40 minutes face-to-face with the patient discussing the above plan of care; more than 50% of time spent on prognosis/ natural history; counseling and coordination.    All questions were answered. The patient knows to call the clinic with any problems, questions or concerns.    Cammie Sickle, MD 11/20/2020 7:51  AM

## 2020-11-19 NOTE — Progress Notes (Addendum)
CORLEONE, BIEGLER ([id number]) Visit Report for 11/16/2020 Arrival Information Details Patient Name: Chad [name], Chad [name] Date of Service: 11/16/2020 11:00 AM Medical Record Number: [id number] Patient Account Number: [id number] Date of Birth/Sex: 1960-07-29 (60 y.o. M) Treating RN: Donnamarie Poag Primary Care Dhruti Ghuman: Clayborn Bigness Other Clinician: Referring Milly Goggins: Clayborn Bigness Treating Basilia Stuckert/Extender: Skipper Cliche in Treatment: 17 Visit Information History Since Last Visit Added or deleted any medications: No Patient Arrived: Wheel Chair Had a fall or experienced change in No Arrival Time: 11:22 activities of daily living that may affect Accompanied By: self risk of falls: Transfer Assistance: None Hospitalized since last visit: No Patient Identification Verified: Yes Has Dressing in Place as Prescribed: Yes Secondary Verification Process Completed: Yes Pain Present Now: Yes Patient Requires Transmission-Based No Precautions: Patient Has Alerts: Yes Patient Alerts: Patient on Blood Thinner ABI right 1.16 ABI left 1.14 Electronic Signature(s) Signed: 11/16/2020 4:01:56 PM By: Donnamarie Poag Entered By: Donnamarie Poag on 11/16/2020 11:22:48 Chad [name] ([id number]) -------------------------------------------------------------------------------- Clinic Level of Care Assessment Details Patient Name: Chad [name] Date of Service: 11/16/2020 11:00 AM Medical Record Number: [id number] Patient Account Number: [id number] Date of Birth/Sex: 05-21-1961 (60 y.o. M) Treating RN: Carlene Coria Primary Care Marketia Stallsmith: Clayborn Bigness Other Clinician: Referring Kollen Armenti: Clayborn Bigness Treating Lenix Kidd/Extender: Skipper Cliche in Treatment: 13 Clinic Level of Care Assessment Items TOOL 4 Quantity Score X - Use when only an EandM is performed on FOLLOW-UP visit 1 0 ASSESSMENTS - Nursing Assessment / Reassessment X - Reassessment of Co-morbidities (includes updates in patient status) 1 10 X- 1  5 Reassessment of Adherence to Treatment Plan ASSESSMENTS - Wound and Skin Assessment / Reassessment X - Simple Wound Assessment / Reassessment - one wound 1 5 []  - 0 Complex Wound Assessment / Reassessment - multiple wounds []  - 0 Dermatologic / Skin Assessment (not related to wound area) ASSESSMENTS - Focused Assessment []  - Circumferential Edema Measurements - multi extremities 0 []  - 0 Nutritional Assessment / Counseling / Intervention []  - 0 Lower Extremity Assessment (monofilament, tuning fork, pulses) []  - 0 Peripheral Arterial Disease Assessment (using hand held doppler) ASSESSMENTS - Ostomy and/or Continence Assessment and Care []  - Incontinence Assessment and Management 0 []  - 0 Ostomy Care Assessment and Management (repouching, etc.) PROCESS - Coordination of Care X - Simple Patient / Family Education for ongoing care 1 15 []  - 0 Complex (extensive) Patient / Family Education for ongoing care X- 1 10 Staff obtains Programmer, systems, Records, Test Results / Process Orders X- 1 10 Staff telephones HHA, Nursing Homes / Clarify orders / etc []  - 0 Routine Transfer to another Facility (non-emergent condition) []  - 0 Routine Hospital Admission (non-emergent condition) []  - 0 New Admissions / Biomedical engineer / Ordering NPWT, Apligraf, etc. []  - 0 Emergency Hospital Admission (emergent condition) X- 1 10 Simple Discharge Coordination []  - 0 Complex (extensive) Discharge Coordination PROCESS - Special Needs []  - Pediatric / Minor Patient Management 0 []  - 0 Isolation Patient Management []  - 0 Hearing / Language / Visual special needs []  - 0 Assessment of Community assistance (transportation, D/C planning, etc.) []  - 0 Additional assistance / Altered mentation []  - 0 Support Surface(s) Assessment (bed, cushion, seat, etc.) INTERVENTIONS - Wound Cleansing / Measurement Gwyn, Brennan ([id number]) []  - 0 Simple Wound Cleansing - one wound X- 2 5 Complex Wound  Cleansing - multiple wounds []  - 0 Wound Imaging (photographs - any number of wounds) X- 1 5 Wound Tracing (instead of photographs) []  - 0 Simple Wound Measurement -  one wound X- 2 5 Complex Wound Measurement - multiple wounds INTERVENTIONS - Wound Dressings X - Small Wound Dressing one or multiple wounds 1 10 X- 1 15 Medium Wound Dressing one or multiple wounds []  - 0 Large Wound Dressing one or multiple wounds X- 1 5 Application of Medications - topical []  - 0 Application of Medications - injection INTERVENTIONS - Miscellaneous []  - External ear exam 0 []  - 0 Specimen Collection (cultures, biopsies, blood, body fluids, etc.) []  - 0 Specimen(s) / Culture(s) sent or taken to Lab for analysis []  - 0 Patient Transfer (multiple staff / Civil Service fast streamer / Similar devices) []  - 0 Simple Staple / Suture removal (25 or less) []  - 0 Complex Staple / Suture removal (26 or more) []  - 0 Hypo / Hyperglycemic Management (close monitor of Blood Glucose) []  - 0 Ankle / Brachial Index (ABI) - do not check if billed separately X- 1 5 Vital Signs Has the patient been seen at the hospital within the last three years: Yes Total Score: 125 Level Of Care: New/Established - Level 4 Electronic Signature(s) Signed: 11/21/2020 3:51:10 PM By: Carlene Coria RN Entered By: Carlene Coria on 11/16/2020 11:49:00 Chad [name] ([id number]) -------------------------------------------------------------------------------- Encounter Discharge Information Details Patient Name: Chad [name] Date of Service: 11/16/2020 11:00 AM Medical Record Number: [id number] Patient Account Number: [id number] Date of Birth/Sex: Sep 17, 1960 (60 y.o. M) Treating RN: Donnamarie Poag Primary Care Chayse Zatarain: Clayborn Bigness Other Clinician: Referring Monroe Qin: Clayborn Bigness Treating Jamarii Banks/Extender: Skipper Cliche in Treatment: 13 Encounter Discharge Information Items Discharge Condition: Stable Ambulatory Status:  Wheelchair Discharge Destination: Skilled Nursing Facility Telephoned: No Orders Sent: Yes Transportation: Other Accompanied By: self Schedule Follow-up Appointment: Yes Clinical Summary of Care: Electronic Signature(s) Signed: 11/16/2020 4:01:56 PM By: Donnamarie Poag Entered By: Donnamarie Poag on 11/16/2020 12:09:14 Chad [name] ([id number]) -------------------------------------------------------------------------------- Lower Extremity Assessment Details Patient Name: Chad [name] Date of Service: 11/16/2020 11:00 AM Medical Record Number: [id number] Patient Account Number: [id number] Date of Birth/Sex: [birth year] (60 y.o. M) Treating RN: Donnamarie Poag Primary Care Carrissa Taitano: Clayborn Bigness Other Clinician: Referring Selisa Tensley: Clayborn Bigness Treating Bravery Ketcham/Extender: Jeri Cos Weeks in Treatment: 13 Edema Assessment Assessed: [Left: Yes] Patrice Paradise: Yes] [Left: Edema] [Right: :] Vascular Assessment Pulses: Dorsalis Pedis Palpable: [Left:Yes] [Right:Yes] Electronic Signature(s) Signed: 11/16/2020 4:01:56 PM By: Donnamarie Poag Entered By: Donnamarie Poag on 11/16/2020 11:27:32 Chad [name] ([id number]) -------------------------------------------------------------------------------- Multi Wound Chart Details Patient Name: Chad [name] Date of Service: 11/16/2020 11:00 AM Medical Record Number: [id number] Patient Account Number: [id number] Date of Birth/Sex: Sep 21, 1960 (60 y.o. M) Treating RN: Carlene Coria Primary Care Kayden Hutmacher: Clayborn Bigness Other Clinician: Referring Juluis Fitzsimmons: Clayborn Bigness Treating Haivyn Oravec/Extender: Skipper Cliche in Treatment: 13 Vital Signs Height(in): 85 Pulse(bpm): 71 Weight(lbs): 160 Blood Pressure(mmHg): 113/63 Body Mass Index(BMI): 25 Temperature(F): 98.2 Respiratory Rate(breaths/min): 16 Photos: [N/A:N/A] Wound Location: Right Toe Second Left, Medial Ankle N/A Wounding Event: Gradually Appeared Gradually Appeared N/A Primary Etiology: Abscess Venous  Leg Ulcer N/A Comorbid History: Anemia, Chronic Obstructive Anemia, Chronic Obstructive N/A Pulmonary Disease (COPD), Pulmonary Disease (COPD), Hepatitis C, Received Hepatitis C, Received Chemotherapy Chemotherapy Date Acquired: 03/28/2020 07/12/2019 N/A Weeks of Treatment: 13 13 N/A Wound Status: Open Open N/A Measurements L x W x D (cm) 0.1x0.1x0.1 4.3x2.8x0.1 N/A Area (cm) : 0.008 9.456 N/A Volume (cm) : 0.001 0.946 N/A % Reduction in Area: 99.30% -60.50% N/A % Reduction in Volume: 99.20% -60.60% N/A Classification: Full Thickness Without Exposed Full Thickness Without Exposed N/A Support Structures Support Structures Exudate Amount: None Present Large N/A Exudate Type: N/A  Purulent N/A Exudate Color: N/A yellow, brown, green N/A Wound Margin: N/A Flat and Intact N/A Granulation Amount: Small (1-33%) Small (1-33%) N/A Granulation Quality: Pink Red, Pink N/A Necrotic Amount: Large (67-100%) Large (67-100%) N/A Necrotic Tissue: Eschar Adherent Slough N/A Exposed Structures: Fat Layer (Subcutaneous Tissue): Fat Layer (Subcutaneous Tissue): N/A Yes Yes Fascia: No Fascia: No Tendon: No Tendon: No Muscle: No Muscle: No Joint: No Joint: No Bone: No Bone: No Epithelialization: None Small (1-33%) N/A Treatment Notes Electronic Signature(s) Signed: 11/21/2020 3:51:10 PM By: Carlene Coria RN Entered By: Carlene Coria on 11/16/2020 11:41:41 Chad [name] ([id number]) Chad [name] ([id number]) -------------------------------------------------------------------------------- Multi-Disciplinary Care Plan Details Patient Name: Chad [name] Date of Service: 11/16/2020 11:00 AM Medical Record Number: [id number] Patient Account Number: [id number] Date of Birth/Sex: [birth year] (60 y.o. M) Treating RN: Carlene Coria Primary Care Rosemary Mossbarger: Clayborn Bigness Other Clinician: Referring Yasmen Cortner: Clayborn Bigness Treating Faline Langer/Extender: Skipper Cliche in Treatment: 13 Active  Inactive Electronic Signature(s) Signed: 11/21/2020 3:51:10 PM By: Carlene Coria RN Entered By: Carlene Coria on 11/16/2020 11:40:46 Chad [name] ([id number]) -------------------------------------------------------------------------------- Pain Assessment Details Patient Name: Chad [name] Date of Service: 11/16/2020 11:00 AM Medical Record Number: [id number] Patient Account Number: [id number] Date of Birth/Sex: 1961/01/31 (60 y.o. M) Treating RN: Donnamarie Poag Primary Care Trell Secrist: Clayborn Bigness Other Clinician: Referring Janna Oak: Clayborn Bigness Treating Audrick Lamoureaux/Extender: Skipper Cliche in Treatment: 13 Active Problems Location of Pain Severity and Description of Pain Patient Has Paino Yes Site Locations Rate the pain. Current Pain Level: 4 Pain Management and Medication Current Pain Management: Electronic Signature(s) Signed: 11/16/2020 4:01:56 PM By: Donnamarie Poag Entered By: Donnamarie Poag on 11/16/2020 11:23:29 Chad [name] ([id number]) -------------------------------------------------------------------------------- Patient/Caregiver Education Details Patient Name: Chad [name] Date of Service: 11/16/2020 11:00 AM Medical Record Number: [id number] Patient Account Number: [id number] Date of Birth/Gender: [birth year] (60 y.o. M) Treating RN: Carlene Coria Primary Care Physician: Clayborn Bigness Other Clinician: Referring Physician: Clayborn Bigness Treating Physician/Extender: Skipper Cliche in Treatment: 13 Education Assessment Education Provided To: Patient Education Topics Provided Wound/Skin Impairment: Methods: Explain/Verbal Responses: State content correctly Electronic Signature(s) Signed: 11/21/2020 3:51:10 PM By: Carlene Coria RN Entered By: Carlene Coria on 11/16/2020 11:49:16 Chad [name] ([id number]) -------------------------------------------------------------------------------- Wound Assessment Details Patient Name: Chad [name] Date of Service: 11/16/2020  11:00 AM Medical Record Number: [id number] Patient Account Number: [id number] Date of Birth/Sex: [birth year] (60 y.o. M) Treating RN: Donnamarie Poag Primary Care Jelesa Mangini: Clayborn Bigness Other Clinician: Referring Hidaya Daniel: Clayborn Bigness Treating Captain Blucher/Extender: Skipper Cliche in Treatment: 13 Wound Status Wound Number: 6 Primary Abscess Etiology: Wound Location: Right Toe Second Wound Open Wounding Event: Gradually Appeared Status: Date Acquired: 03/28/2020 Comorbid Anemia, Chronic Obstructive Pulmonary Disease (COPD), Weeks Of Treatment: 13 History: Hepatitis C, Received Chemotherapy Clustered Wound: No Photos Wound Measurements Length: (cm) 0.1 Width: (cm) 0.1 Depth: (cm) 0.1 Area: (cm) 0.008 Volume: (cm) 0.001 % Reduction in Area: 99.3% % Reduction in Volume: 99.2% Epithelialization: None Tunneling: No Undermining: No Wound Description Classification: Full Thickness Without Exposed Support Structures Exudate Amount: None Present Foul Odor After Cleansing: No Slough/Fibrino No Wound Bed Granulation Amount: Small (1-33%) Exposed Structure Granulation Quality: Pink Fascia Exposed: No Necrotic Amount: Large (67-100%) Fat Layer (Subcutaneous Tissue) Exposed: Yes Necrotic Quality: Eschar Tendon Exposed: No Muscle Exposed: No Joint Exposed: No Bone Exposed: No Treatment Notes Wound #6 (Toe Second) Wound Laterality: Right Cleanser Normal Saline Discharge Instruction: Wash your hands with soap and water. Remove old dressing, discard into plastic bag and place into trash. Cleanse the wound with Normal Saline prior to applying  a clean dressing using gauze sponges, not tissues or cotton balls. Do not scrub or use excessive force. Pat dry using gauze sponges, not tissue or cotton balls. Peri-Wound Care FAHD, GALEA ([id number]) Topical Primary Dressing Silvercel Small 2x2 (in/in) Discharge Instruction: Apply Silvercel Small 2x2 (in/in) as instructed Secondary  Dressing Coverlet Latex-Free Fabric Adhesive Dressings Discharge Instruction: Secure just the wounded toe Secured With Compression Wrap Compression Stockings Add-Ons Electronic Signature(s) Signed: 11/16/2020 4:01:56 PM By: Donnamarie Poag Entered By: Donnamarie Poag on 11/16/2020 11:26:11 Chad [name] ([id number]) -------------------------------------------------------------------------------- Wound Assessment Details Patient Name: Chad [name] Date of Service: 11/16/2020 11:00 AM Medical Record Number: [id number] Patient Account Number: [id number] Date of Birth/Sex: 1961/02/04 (60 y.o. M) Treating RN: Donnamarie Poag Primary Care Louise Rawson: Clayborn Bigness Other Clinician: Referring Simmone Cape: Clayborn Bigness Treating Kenli Waldo/Extender: Skipper Cliche in Treatment: 13 Wound Status Wound Number: 8 Primary Venous Leg Ulcer Etiology: Wound Location: Left, Medial Ankle Wound Open Wounding Event: Gradually Appeared Status: Date Acquired: 07/12/2019 Comorbid Anemia, Chronic Obstructive Pulmonary Disease (COPD), Weeks Of Treatment: 13 History: Hepatitis C, Received Chemotherapy Clustered Wound: No Photos Wound Measurements Length: (cm) 4.3 Width: (cm) 2.8 Depth: (cm) 0.1 Area: (cm) 9.456 Volume: (cm) 0.946 % Reduction in Area: -60.5% % Reduction in Volume: -60.6% Epithelialization: Small (1-33%) Tunneling: No Undermining: No Wound Description Classification: Full Thickness Without Exposed Support Structures Wound Margin: Flat and Intact Exudate Amount: Large Exudate Type: Purulent Exudate Color: yellow, brown, green Foul Odor After Cleansing: No Slough/Fibrino Yes Wound Bed Granulation Amount: Small (1-33%) Exposed Structure Granulation Quality: Red, Pink Fascia Exposed: No Necrotic Amount: Large (67-100%) Fat Layer (Subcutaneous Tissue) Exposed: Yes Necrotic Quality: Adherent Slough Tendon Exposed: No Muscle Exposed: No Joint Exposed: No Bone Exposed: No Treatment  Notes Wound #8 (Ankle) Wound Laterality: Left, Medial Cleanser Normal Saline Discharge Instruction: Wash your hands with soap and water. Remove old dressing, discard into plastic bag and place into trash. Cleanse the wound with Normal Saline prior to applying a clean dressing using gauze sponges, not tissues or cotton balls. Do not scrub or use excessive force. Pat dry using gauze sponges, not tissue or cotton balls. JEVONTE, CLANTON ([id number]) Peri-Wound Care Desitin Maximum Strength Ointment 4 (oz) Discharge Instruction: Zinc periwound Topical Gentamicin Discharge Instruction: Apply to wound bed Primary Dressing Silvercel Small 2x2 (in/in) Discharge Instruction: Apply Silvercel Small 2x2 (in/in) as instructed Secondary Dressing ABD Pad 5x9 (in/in) Discharge Instruction: Cover with ABD pad Kerlix 4.5 x 4.1 (in/yd) Discharge Instruction: Apply Kerlix 4.5 x 4.1 (in/yd) as instructed Secured With 58M Medipore H Soft Cloth Surgical Tape, 2x2 (in/yd) Compression Wrap Compression Stockings Add-Ons Electronic Signature(s) Signed: 11/16/2020 4:01:56 PM By: Donnamarie Poag Entered By: Donnamarie Poag on 11/16/2020 11:27:10 Chad [name] ([id number]) -------------------------------------------------------------------------------- Tea Details Patient Name: Chad [name] Date of Service: 11/16/2020 11:00 AM Medical Record Number: [id number] Patient Account Number: [id number] Date of Birth/Sex: July 30, 1960 (60 y.o. M) Treating RN: Donnamarie Poag Primary Care Recia Sons: Clayborn Bigness Other Clinician: Referring Evett Kassa: Clayborn Bigness Treating Quency Tober/Extender: Skipper Cliche in Treatment: 13 Vital Signs Time Taken: 11:23 Temperature (F): 98.2 Height (in): 67 Pulse (bpm): 74 Weight (lbs): 160 Respiratory Rate (breaths/min): 16 Body Mass Index (BMI): 25.1 Blood Pressure (mmHg): 113/63 Reference Range: 80 - 120 mg / dl Electronic Signature(s) Signed: 11/16/2020 4:01:56 PM By: Donnamarie Poag Entered ByDonnamarie Poag on 11/16/2020 11:23:19

## 2020-11-19 NOTE — Progress Notes (Signed)
Pt received venofer and keytruda infusions in clinic today. Tolerated well.

## 2020-11-19 NOTE — Assessment & Plan Note (Addendum)
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 JAN 5th, 2022- CT findings worrisome for progressive right renal pelvis and ureteral tumor and mass suspicious for colonic neoplasm involving the descending colon sigmoid colon junction region [see below].  Mild soft tissue thickening around the distal right ureter could reflect ureteral tumor; FEB 2022-  RIGHT renal pelvis and bladder are not evaluable by FDG PET imaging due to high activity of radiotracer within the urine. STABLE; but see below re: sigmoid mass/colon cancer  # proceed with  [place] today; Labs today reviewed; Labs today reviewed;  acceptable for treatment today.   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  Options include surgery vs-continue surveillance given his multiple comorbidities /stage IV ureteral cancer. Referral to Dr.Byrnett.  Discussed with Dr. Bary Castilla.  # Iron deficiency anemia-hemoglobin 11.5; February 2022 ron studies/ferritin-LOW; plan Venofer with infusions;  STABLE.   # Bilateral LE ulcers-s/p wound care evaluation; s/p PTCA with Dr.Dew. bil Arterial Dopp-wnl; STABLE [Dr.]  *vac #DISPOSITION: # referral to Dr.Byrnett re: colon cancer #  keytruda/venofer today;  # follow up in 4 weeks-;MD labs-cbc/cmp;  Keytruda; venofer- Dr.B  # 40 minutes face-to-face with the patient discussing the above plan of care; more than 50% of time spent on prognosis/ natural history; counseling and coordination.

## 2020-11-19 NOTE — Progress Notes (Signed)
Has 2 cysts on right arm. Has been there for a while but has noticed it more since it is causing discomfort with certain arm placements.

## 2020-11-19 NOTE — Telephone Encounter (Signed)
referral faxed to Dr. Dwyane Luo office. Referral for colon cancer.

## 2020-11-19 NOTE — Patient Instructions (Signed)
[facility]   Discharge Instructions: Thank you for choosing [facility] to provide your oncology and hematology care.  If you have a lab appointment with the [facility], please go directly to the [facility] and check in at the registration area.  Wear comfortable clothing and clothing appropriate for easy access to any Portacath or PICC line.   We strive to give you quality time with yo  Pembrolizumab injection What is this medicine? PEMBROLIZUMAB (pem broe liz ue mab) is a monoclonal antibody. It is used to treat certain types of cancer. This medicine may be used for other purposes; ask your health care provider or pharmacist if you have questions. COMMON BRAND NAME(S): Keytruda What should I tell my health care provider before I take this medicine? They need to know if you have any of these conditions:  autoimmune diseases like Crohn's disease, ulcerative colitis, or lupus  have had or planning to have an allogeneic stem cell transplant (uses someone else's stem cells)  history of organ transplant  history of chest radiation  nervous system problems like myasthenia gravis or Guillain-Barre syndrome  an unusual or allergic reaction to pembrolizumab, other medicines, foods, dyes, or preservatives  pregnant or trying to get pregnant  breast-feeding How should I use this medicine? This medicine is for infusion into a vein. It is given by a health care professional in a hospital or clinic setting. A special MedGuide will be given to you before each treatment. Be sure to read this information carefully each time. Talk to your pediatrician regarding the use of this medicine in children. While this drug may be prescribed for children as young as 6 months for selected conditions, precautions do apply. Overdosage: If you think you have taken too much of this medicine contact a poison control center or emergency room at once. NOTE:  This medicine is only for you. Do not share this medicine with others. What if I miss a dose? It is important not to miss your dose. Call your doctor or health care professional if you are unable to keep an appointment. What may interact with this medicine? Interactions have not been studied. This list may not describe all possible interactions. Give your health care provider a list of all the medicines, herbs, non-prescription drugs, or dietary supplements you use. Also tell them if you smoke, drink alcohol, or use illegal drugs. Some items may interact with your medicine. What should I watch for while using this medicine? Your condition will be monitored carefully while you are receiving this medicine. You may need blood work done while you are taking this medicine. Do not become pregnant while taking this medicine or for 4 months after stopping it. Women should inform their doctor if they wish to become pregnant or think they might be pregnant. There is a potential for serious side effects to an unborn child. Talk to your health care professional or pharmacist for more information. Do not breast-feed an infant while taking this medicine or for 4 months after the last dose. What side effects may I notice from receiving this medicine? Side effects that you should report to your doctor or health care professional as soon as possible:  allergic reactions like skin rash, itching or hives, swelling of the face, lips, or tongue  bloody or black, tarry  breathing problems  changes in vision  chest pain  chills  confusion  constipation  cough  diarrhea  dizziness or feeling faint  or lightheaded  fast or irregular heartbeat  fever  flushing  joint pain  low blood counts - this medicine may decrease the number of [place] blood cells, red blood cells and platelets. You may be at increased risk for infections and bleeding.  muscle pain  muscle weakness  pain, tingling, numbness  in the hands or feet  persistent headache  redness, blistering, peeling or loosening of the skin, including inside the mouth  signs and symptoms of high blood sugar such as dizziness; dry mouth; dry skin; fruity breath; nausea; stomach pain; increased hunger or thirst; increased urination  signs and symptoms of kidney injury like trouble passing urine or change in the amount of urine  signs and symptoms of liver injury like dark urine, light-colored stools, loss of appetite, nausea, right upper belly pain, yellowing of the eyes or skin  sweating  swollen lymph nodes  weight loss Side effects that usually do not require medical attention (report to your doctor or health care professional if they continue or are bothersome):  decreased appetite  hair loss  tiredness This list may not describe all possible side effects. Call your doctor for medical advice about side effects. You may report side effects to FDA at 1-800-FDA-1088. Where should I keep my medicine? This drug is given in a hospital or clinic and will not be stored at home. NOTE: This sheet is a summary. It may not cover all possible information. If you have questions about this medicine, talk to your doctor, pharmacist, or health care provider.  2021 Elsevier/Gold Standard (2019-06-15 21:44:53)   Iron Sucrose injection What is this medicine? IRON SUCROSE (AHY ern SOO krohs) is an iron complex. Iron is used to make healthy red blood cells, which carry oxygen and nutrients throughout the body. This medicine is used to treat iron deficiency anemia in people with chronic kidney disease. This medicine may be used for other purposes; ask your health care provider or pharmacist if you have questions. COMMON BRAND NAME(S): Venofer What should I tell my health care provider before I take this medicine? They need to know if you have any of these conditions:  anemia not caused by low iron levels  heart disease  high levels of  iron in the blood  kidney disease  liver disease  an unusual or allergic reaction to iron, other medicines, foods, dyes, or preservatives  pregnant or trying to get pregnant  breast-feeding How should I use this medicine? This medicine is for infusion into a vein. It is given by a health care professional in a hospital or clinic setting. Talk to your pediatrician regarding the use of this medicine in children. While this drug may be prescribed for children as young as 2 years for selected conditions, precautions do apply. Overdosage: If you think you have taken too much of this medicine contact a poison control center or emergency room at once. NOTE: This medicine is only for you. Do not share this medicine with others. What if I miss a dose? It is important not to miss your dose. Call your doctor or health care professional if you are unable to keep an appointment. What may interact with this medicine? Do not take this medicine with any of the following medications:  deferoxamine  dimercaprol  other iron products This medicine may also interact with the following medications:  chloramphenicol  deferasirox This list may not describe all possible interactions. Give your health care provider a list of all the medicines, herbs, non-prescription  drugs, or dietary supplements you use. Also tell them if you smoke, drink alcohol, or use illegal drugs. Some items may interact with your medicine. What should I watch for while using this medicine? Visit your doctor or healthcare professional regularly. Tell your doctor or healthcare professional if your symptoms do not start to get better or if they get worse. You may need blood work done while you are taking this medicine. You may need to follow a special diet. Talk to your doctor. Foods that contain iron include: whole grains/cereals, dried fruits, beans, or peas, leafy green vegetables, and organ meats (liver, kidney). What side effects  may I notice from receiving this medicine? Side effects that you should report to your doctor or health care professional as soon as possible:  allergic reactions like skin rash, itching or hives, swelling of the face, lips, or tongue  breathing problems  changes in blood pressure  cough  fast, irregular heartbeat  feeling faint or lightheaded, falls  fever or chills  flushing, sweating, or hot feelings  joint or muscle aches/pains  seizures  swelling of the ankles or feet  unusually weak or tired Side effects that usually do not require medical attention (report to your doctor or health care professional if they continue or are bothersome):  diarrhea  feeling achy  headache  irritation at site where injected  nausea, vomiting  stomach upset  tiredness This list may not describe all possible side effects. Call your doctor for medical advice about side effects. You may report side effects to FDA at 1-800-FDA-1088. Where should I keep my medicine? This drug is given in a hospital or clinic and will not be stored at home. NOTE: This sheet is a summary. It may not cover all possible information. If you have questions about this medicine, talk to your doctor, pharmacist, or health care provider.  2021 Elsevier/Gold Standard (2011-04-24 17:14:35) ur provider. You may need to reschedule your appointment if you arrive late (15 or more minutes).  Arriving late affects you and other patients whose appointments are after yours.  Also, if you miss three or more appointments without notifying the office, you may be dismissed from the clinic at the provider's discretion.      For prescription refill requests, have your pharmacy contact our office and allow 72 hours for refills to be completed.    Today you received the following chemotherapy and/or immunotherapy agents - [name]      To help prevent nausea and vomiting after your treatment, we encourage you to take your nausea  medication as directed.  BELOW ARE SYMPTOMS THAT SHOULD BE REPORTED IMMEDIATELY: . *FEVER GREATER THAN 100.4 F (38 C) OR HIGHER . *CHILLS OR SWEATING . *NAUSEA AND VOMITING THAT IS NOT CONTROLLED WITH YOUR NAUSEA MEDICATION . *UNUSUAL SHORTNESS OF BREATH . *UNUSUAL BRUISING OR BLEEDING . *URINARY PROBLEMS (pain or burning when urinating, or frequent urination) . *BOWEL PROBLEMS (unusual diarrhea, constipation, pain near the anus) . TENDERNESS IN MOUTH AND THROAT WITH OR WITHOUT PRESENCE OF ULCERS (sore throat, sores in mouth, or a toothache) . UNUSUAL RASH, SWELLING OR PAIN  . UNUSUAL VAGINAL DISCHARGE OR ITCHING   Items with * indicate a potential emergency and should be followed up as soon as possible or go to the Emergency Department if any problems should occur.  Please show the CHEMOTHERAPY ALERT CARD or IMMUNOTHERAPY ALERT CARD at check-in to the Emergency Department and triage nurse.  Should you have questions after your visit or  need to cancel or reschedule your appointment, please contact [facility]  [phone] and follow the prompts.  Office hours are 8:00 a.m. to 4:30 p.m. Monday - Friday. Please note that voicemails left after 4:00 p.m. may not be returned until the following business day.  We are closed weekends and major holidays. You have access to a nurse at all times for urgent questions. Please call the main number to the clinic [phone] and follow the prompts.  For any non-urgent questions, you may also contact your provider using MyChart. We now offer e-Visits for anyone 29 and older to request care online for non-urgent symptoms. For details visit [email or web address].   Also download the MyChart app! Go to the app store, search "MyChart", open the app, select [facility], and log in with your MyChart username and password.  Due to Covid, a mask is required upon entering the hospital/clinic. If you do not have a mask, one  will be given to you upon arrival. For doctor visits, patients may have 1 support person aged 60 or older with them. For treatment visits, patients cannot have anyone with them due to current Covid guidelines and our immunocompromised population.

## 2020-11-22 ENCOUNTER — Encounter: Payer: Medicaid Other | Admitting: Internal Medicine

## 2020-11-22 ENCOUNTER — Other Ambulatory Visit: Payer: Self-pay

## 2020-11-22 ENCOUNTER — Other Ambulatory Visit: Payer: Medicaid Other

## 2020-11-22 DIAGNOSIS — L97322 Non-pressure chronic ulcer of left ankle with fat layer exposed: Secondary | ICD-10-CM | POA: Diagnosis not present

## 2020-11-22 NOTE — Progress Notes (Signed)
Chad [name], Chad [name] ([id number]) Visit Report for 11/22/2020 Debridement Details Patient Name: Chad [name], Chad [name] Date of Service: 11/22/2020 10:30 AM Medical Record Number: [id number] Patient Account Number: [id number] Date of Birth/Sex: October 22, 1960 (60 y.o. M) Treating RN: Dolan Amen Primary Care Provider: Clayborn Bigness Other Clinician: Referring Provider: Clayborn Bigness Treating Provider/Extender: Tito Dine in Treatment: 14 Debridement Performed for Wound #8 Left,Medial Ankle Assessment: Performed By: Physician Ricard Dillon, MD Debridement Type: Debridement Severity of Tissue Pre Debridement: Fat layer exposed Level of Consciousness (Pre- Awake and Alert procedure): Pre-procedure Verification/Time Out Yes - 10:48 Taken: Start Time: 10:48 Total Area Debrided (L x W): 4.5 (cm) x 2.4 (cm) = 10.8 (cm) Tissue and other material Viable, Non-Viable, Slough, Subcutaneous, Slough debrided: Level: Skin/Subcutaneous Tissue Debridement Description: Excisional Instrument: Curette Bleeding: Minimum Hemostasis Achieved: Pressure Response to Treatment: Procedure was tolerated well Level of Consciousness (Post- Awake and Alert procedure): Post Debridement Measurements of Total Wound Length: (cm) 4.5 Width: (cm) 2.4 Depth: (cm) 0.2 Volume: (cm) 1.696 Character of Wound/Ulcer Post Debridement: Stable Severity of Tissue Post Debridement: Fat layer exposed Post Procedure Diagnosis Same as Pre-procedure Electronic Signature(s) Signed: 11/22/2020 5:05:31 PM By: Charlett Nose RN Signed: 11/22/2020 5:07:17 PM By: Linton Ham MD Entered By: Linton Ham on 11/22/2020 12:54:10 Chad [name] ([id number]) -------------------------------------------------------------------------------- HPI Details Patient Name: Chad [name] Date of Service: 11/22/2020 10:30 AM Medical Record Number: [id number] Patient Account Number: [id number] Date of Birth/Sex: 1961/02/08 (60  y.o. M) Treating RN: Dolan Amen Primary Care Provider: Clayborn Bigness Other Clinician: Referring Provider: Clayborn Bigness Treating Provider/Extender: Tito Dine in Treatment: 14 History of Present Illness HPI Description: 10/08/18 on evaluation today patient actually presents to our office for initial evaluation concerning wounds that he has of the bilateral lower extremities. 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 He also has bilateral venous stasis. He apparently has been homeless currently following discharge from the hospital apparently he has been placed at [facility] which is is a skilled nursing facility locally. Nonetheless fortunately he does not show any signs of infection at this time which is good news. In fact several of the wound actually appears to be showing some signs of improvement already in my pinion. There are a couple areas in the left leg in particular there likely gonna require some sharp debridement to help clear away some necrotic tissue and help with more sufficient healing. No fevers, chills, nausea, or vomiting noted at this time. 10/15/18 on evaluation today patient actually appears to be doing very well in regard to his bilateral lower extremities. He's been tolerating the dressing changes without complication. Fortunately there does not appear to be any evidence of active infection at this time which is great news. Overall I'm actually very pleased with how this has progressed in just one visits time. Readmission: 08/14/2020 upon evaluation today patient presents for re-evaluation here in our clinic. He is having issues with his left ankle region as well as his right toe and his right heel. He tells me that the toe and heel actually began as a area that was itching that he was scratching and  then subsequently opened up into wounds. These may have been abscess areas I presume based on what I am seeing currently. With regard to his left ankle region he tells me this was a similar type occurrence although he does  have venous stasis this very well may be more of a venous leg ulcer more than anything. Nonetheless I do believe that the patient would benefit from appropriate and aggressive wound care to try to help get things under better control here. He does have history of a stroke on the left side affecting him to some degree there that he is able to stand although he does have some residual weakness. Otherwise again the patient does have chronic venous insufficiency as previously noted. His arterial studies most recently obtained showed that he had an ABI on the right of 1.16 with a TBI of 0.52 and on the left and ABI of 1.14 with a TBI of 0.81. That was obtained on 06/19/2020. 08/28/2020 upon evaluation today patient appears to be doing decently well in regard to his wounds in general. He has been tolerating the dressing changes without complication. Fortunately there does not appear to be any signs of active infection which is great news. With that being said I think the [facility] is doing a good job I would recommend that we likely continue with that currently. 09/11/2020 upon evaluation today patient's wounds did not appear to be doing too poorly but again he is not really showing signs of significant improvement with regard to any of the wounds on the right. None of them have Hydrofera Blue on them I am not exactly sure why this is not being followed as the facility did not contact us to let us know of any issues with obtaining dressings or otherwise. With that being said he is supposed to be using Hydrofera Blue on both of the wounds on the right foot as well as the ankle wound on the left side. 09/18/2020 upon evaluation today patient appears to be doing poorly with regard to his  wounds. Again right now the left ankle in particular showed signs of extreme maceration. Apparently he was told by someone with staff at [facility] they could not get the [facility]. With that being said this is something that is never been relayed to [place] one way or another. Also the patient subsequently has not supposed to have a border gauze dressing on. He should have an ABD pad and roll gauze to secure as this drains much too much just to have a border gauze dressing to cover. Nonetheless the fact that they are not using the appropriate dressing is directly causing deterioration of the left ankle wound it is significantly worse today compared to what it was previous. I did attempt to call [facility] while the patient was here I called three times and got no one to even pick up the phone. After this I had my for an office coordinator call and she was able to finally get through and leave a message with the D ON as of dictation of this note which is roughly about an hour and a half later I still have not been able to speak with anyone at the facility. 09/25/2020 upon evaluation today patient actually showing signs of good improvement which is excellent news. He has been tolerating the dressing changes without complication. Fortunately there is no signs of active infection which is great news. No fevers, chills, nausea, vomiting, or diarrhea. I do feel like the facility has been doing a much better job at taking care of him as far as the dressings are concerned. However the director of nursing never did call me back. 10/09/2020 upon evaluation today patient appears to be doing well  with regard to his wound. The toe ulcer did require some debridement but the other 2 areas actually appear to be doing quite well. 10/19/2020 upon evaluation today patient actually appears to be doing very well in regard to his wounds. In fact the heel does appear to be completely healed. The toe is doing  better in the medial ankle on the left is also doing better. Overall I think he is headed in the right direction. 10/26/2020 upon evaluation today patient appears to be doing well with regard to his wound. He is showing signs of improvement which is great news and overall I am very pleased with where things stand today. No fevers, chills, nausea, vomiting, or diarrhea. 11/02/2020 upon evaluation today patient appears to be doing well with regard to his wounds. He has been tolerating the dressing changes without complication overall I am extremely pleased with where things stand today. He in regard to the toe is almost completely healed and the medial ankle on the left is doing much better. 11/09/2020 upon evaluation today patient appears to be doing a little poorly in regard to his left medial ankle ulcer. Fortunately there does not appear to be any signs of systemic infection but unfortunately locally he does appear to be infected in fact he has blue-green drainage consistent with Pseudomonas. Chad [name], Chad [name] ([id number]) 11/16/2020 upon evaluation today patient appears to be doing well with regard to his wound. It actually appears to be doing better. I did place him on gentamicin cream since the Cipro was actually resistant even though he was positive for Pseudomonas on culture. Overall I think that he does seem to be doing better though I am unsure whether or not they have actually been putting the cream on. The patient is not sure that we did talk to the nurse directly and she was going to initiate that treatment. Fortunately there does not appear to be any signs of active infection at this time. No fevers, chills, nausea, vomiting, or diarrhea. 4/28; the area on the right second toe is close to healed. Left medial ankle required debridement Electronic Signature(s) Signed: 11/22/2020 5:07:17 PM By: Linton Ham MD Entered By: Linton Ham on 11/22/2020 12:56:17 Chad [name]  ([id number]) -------------------------------------------------------------------------------- Physical Exam Details Patient Name: Chad [name] Date of Service: 11/22/2020 10:30 AM Medical Record Number: [id number] Patient Account Number: [id number] Date of Birth/Sex: 1961/04/28 (60 y.o. M) Treating RN: Dolan Amen Primary Care Provider: Clayborn Bigness Other Clinician: Referring Provider: Clayborn Bigness Treating Provider/Extender: Tito Dine in Treatment: 14 Constitutional Sitting or standing Blood Pressure is within target range for patient.. Pulse regular and within target range for patient.Marland Kitchen Respirations regular, non- labored and within target range.. Temperature is normal and within the target range for the patient.Marland Kitchen appears in no distress. Notes Wound exam; under illumination the patient's wound had some very adherent surface slough. Debrided with a #5 curette with some difficulty subcutaneous tissue to remove and clean up the wound surface. Hemostasis with direct pressure. I think the right second toe is close to healed may be a small open area remaining Electronic Signature(s) Signed: 11/22/2020 5:07:17 PM By: Linton Ham MD Entered By: Linton Ham on 11/22/2020 12:55:41 Chad [name] ([id number]) -------------------------------------------------------------------------------- Physician Orders Details Patient Name: Chad [name] Date of Service: 11/22/2020 10:30 AM Medical Record Number: [id number] Patient Account Number: [id number] Date of Birth/Sex: Oct 19, 1960 (60 y.o. M) Treating RN: Dolan Amen Primary Care Provider: Clayborn Bigness Other Clinician: Referring Provider: Clayborn Bigness Treating Provider/Extender: Tito Dine in  Treatment: 14 Verbal / Phone Orders: No Diagnosis Coding Follow-up Appointments o Return Appointment in 1 week. Wound Treatment Wound #6 - Toe Second Wound Laterality: Right Cleanser: Normal Saline 1 x Per Day/30  Days Discharge Instructions: Wash your hands with soap and water. Remove old dressing, discard into plastic bag and place into trash. Cleanse the wound with Normal Saline prior to applying a clean dressing using gauze sponges, not tissues or cotton balls. Do not scrub or use excessive force. Pat dry using gauze sponges, not tissue or cotton balls. Primary Dressing: Silvercel Small 2x2 (in/in) (Generic) 1 x Per Day/30 Days Discharge Instructions: Apply Silvercel Small 2x2 (in/in) as instructed Secondary Dressing: Coverlet Latex-Free Fabric Adhesive Dressings 1 x Per Day/30 Days Discharge Instructions: Secure just the wounded toe Wound #8 - Ankle Wound Laterality: Left, Medial Cleanser: Normal Saline 1 x Per Day/30 Days Discharge Instructions: Wash your hands with soap and water. Remove old dressing, discard into plastic bag and place into trash. Cleanse the wound with Normal Saline prior to applying a clean dressing using gauze sponges, not tissues or cotton balls. Do not scrub or use excessive force. Pat dry using gauze sponges, not tissue or cotton balls. Peri-Wound Care: Desitin Maximum Strength Ointment 4 (oz) 1 x Per Day/30 Days Discharge Instructions: Zinc periwound Topical: Gentamicin 1 x Per Day/30 Days Discharge Instructions: Apply to wound bed lightly Primary Dressing: Silvercel Small 2x2 (in/in) 1 x Per Day/30 Days Discharge Instructions: Apply Silvercel Small 2x2 (in/in) as instructed Secondary Dressing: ABD Pad 5x9 (in/in) 1 x Per Day/30 Days Discharge Instructions: Cover with ABD pad Secondary Dressing: Kerlix 4.5 x 4.1 (in/yd) 1 x Per Day/30 Days Discharge Instructions: Apply Kerlix 4.5 x 4.1 (in/yd) as instructed Secured With: 82M Medipore H Soft Cloth Surgical Tape, 2x2 (in/yd) 1 x Per Day/30 Days Electronic Signature(s) Signed: 11/22/2020 5:05:31 PM By: Georges Mouse, Minus Breeding RN Signed: 11/22/2020 5:07:17 PM By: Linton Ham MD Entered By: Georges Mouse, Minus Breeding on  11/22/2020 10:51:02 Chad [name] ([id number]) -------------------------------------------------------------------------------- Problem List Details Patient Name: Chad [name] Date of Service: 11/22/2020 10:30 AM Medical Record Number: [id number] Patient Account Number: [id number] Date of Birth/Sex: 19-May-1961 (60 y.o. 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 08/14/2020 No Yes exposed L97.512 Non-pressure chronic ulcer of other part of right foot with fat layer 08/14/2020 No Yes exposed 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 M) Treating RN: Dolan Amen Primary Care Provider: Clayborn Bigness Other Clinician: Referring Provider: Clayborn Bigness Treating Provider/Extender: Tito Dine in Treatment: 14 Subjective History of Present Illness (HPI) 10/08/18 on evaluation today patient actually presents to our office for initial evaluation concerning wounds that he has of the bilateral  lower extremities. He has no history of known diabetes, he does have hepatitis C, urinary tract cancer for which she  receives infusions not chemotherapy, and the history of the left-sided stroke with residual weakness. He also has bilateral venous stasis. He apparently has been homeless currently following discharge from the hospital apparently he has been placed at [facility] which is is a skilled nursing facility locally. Nonetheless fortunately he does not show any signs of infection at this time which is good news. In fact several of the wound actually appears to be showing some signs of improvement already in my pinion. There are a couple areas in the left leg in particular there likely gonna require some sharp debridement to help clear away some necrotic tissue and help with more sufficient healing. No fevers, chills, nausea, or vomiting noted at this time. 10/15/18 on evaluation today patient actually appears to be doing very well in regard to his bilateral lower extremities. He's been tolerating the dressing changes without complication. Fortunately there does not appear to be any evidence of active infection at this time which is great news. Overall I'm actually very pleased with how this has progressed in just one visits time. Readmission: 08/14/2020 upon evaluation today patient presents for re-evaluation here in our clinic. He is having issues with his left ankle region as well as his right toe and his right heel. He tells me that the toe and heel actually began as a area that was itching that he was scratching and then subsequently opened up into wounds. These may have been abscess areas I presume based on what I am seeing currently. With regard to his left ankle region he tells me this was a similar type occurrence although he does have venous stasis this very well may be more of a venous leg ulcer more than anything. Nonetheless I do believe that the patient would benefit from  appropriate and aggressive wound care to try to help get things under better control here. He does have history of a stroke on the left side affecting him to some degree there that he is able to stand although he does have some residual weakness. Otherwise again the patient does have chronic venous insufficiency as previously noted. His arterial studies most recently obtained showed that he had an ABI on the right of 1.16 with a TBI of 0.52 and on the left and ABI of 1.14 with a TBI of 0.81. That was obtained on 06/19/2020. 08/28/2020 upon evaluation today patient appears to be doing decently well in regard to his wounds in general. He has been tolerating the dressing changes without complication. Fortunately there does not appear to be any signs of active infection which is great news. With that being said I think the [facility] is doing a good job I would recommend that we likely continue with that currently. 09/11/2020 upon evaluation today patient's wounds did not appear to be doing too poorly but again he is not really showing signs of significant improvement with regard to any of the wounds on the right. None of them have Hydrofera Blue on them I am not exactly sure why this is not being followed as the facility did not contact us to let us know of any issues with obtaining dressings or otherwise. With that being said he is supposed to be using Hydrofera Blue on both of the wounds on the right foot as well as the ankle wound on the left side. 09/18/2020 upon evaluation today patient appears to be doing poorly with regard to his wounds. Again right now the left ankle in particular showed signs of  extreme maceration. Apparently he was [facility] With that being said this is something that is never been relayed to us one way or another. Also the patient subsequently has not supposed to have a border gauze dressing on. He  should have an ABD pad and roll gauze to secure as this drains much too much just to have a border gauze dressing to cover. Nonetheless the fact that they are not using the appropriate dressing is directly causing deterioration of the left ankle wound it is significantly worse today compared to what it was previous. I did attempt to call [facility] while the patient was here I called three times and got no one to even pick up the phone. After this I had my for an office coordinator call and she was able to finally get through and leave a message with the D ON as of dictation of this note which is roughly about an hour and a half later I still have not been able to speak with anyone at the facility. 09/25/2020 upon evaluation today patient actually showing signs of good improvement which is excellent news. He has been tolerating the dressing changes without complication. Fortunately there is no signs of active infection which is great news. No fevers, chills, nausea, vomiting, or diarrhea. I do feel like the facility has been doing a much better job at taking care of him as far as the dressings are concerned. However the director of nursing never did call me back. 10/09/2020 upon evaluation today patient appears to be doing well with regard to his wound. The toe ulcer did require some debridement but the other 2 areas actually appear to be doing quite well. 10/19/2020 upon evaluation today patient actually appears to be doing very well in regard to his wounds. In fact the heel does appear to be completely healed. The toe is doing better in the medial ankle on the left is also doing better. Overall I think he is headed in the right direction. 10/26/2020 upon evaluation today patient appears to be doing well with regard to his wound. He is showing signs of improvement which is great news and overall I am very pleased with where things stand today. No fevers, chills, nausea, vomiting, or  diarrhea. 11/02/2020 upon evaluation today patient appears to be doing well with regard to his wounds. He has been tolerating the dressing changes without complication overall I am extremely pleased with where things stand today. He in regard to the toe is almost completely healed and the medial ankle on the left is doing much better. 11/09/2020 upon evaluation today patient appears to be doing a little poorly in regard to his left medial ankle ulcer. Fortunately there does not appear to be any signs of systemic infection but unfortunately locally he does appear to be infected in fact he has blue-green drainage consistent with Pseudomonas. Chad [name], Chad [name] ([id number]) 11/16/2020 upon evaluation today patient appears to be doing well with regard to his wound. It actually appears to be doing better. I did place him on gentamicin cream since the Cipro was actually resistant even though he was positive for Pseudomonas on culture. Overall I think that he does seem to be doing better though I am unsure whether or not they have actually been putting the cream on. The patient is not sure that we did talk to the nurse directly and she was going to initiate that treatment. Fortunately  there does not appear to be any signs of active infection at this time. No fevers, chills, nausea, vomiting, or diarrhea. 4/28; the area on the right second toe is close to healed. Left medial ankle required debridement Objective Constitutional Sitting or standing Blood Pressure is within target range for patient.. Pulse regular and within target range for patient.Marland Kitchen Respirations regular, non- labored and within target range.. Temperature is normal and within the target range for the patient.Marland Kitchen appears in no distress. Vitals Time Taken: 10:38 AM, Height: 67 in, Weight: 160 lbs, BMI: 25.1, Temperature: 98 F, Pulse: 91 bpm, Respiratory Rate: 18 breaths/min, Blood Pressure: 109/70 mmHg. General Notes: Wound exam; under illumination  the patient's wound had some very adherent surface slough. Debrided with a #5 curette with some difficulty subcutaneous tissue to remove and clean up the wound surface. Hemostasis with direct pressure. I think the right second toe is close to healed may be a small open area remaining Integumentary (Hair, Skin) Wound #6 status is Open. Original cause of wound was Gradually Appeared. The date acquired was: 03/28/2020. The wound has been in treatment 14 weeks. The wound is located on the Right Toe Second. The wound measures 0.1cm length x 0.1cm width x 0.1cm depth; 0.008cm^2 area and 0.001cm^3 volume. There is no tunneling or undermining noted. There is a none present amount of drainage noted. There is no granulation within the wound bed. There is a large (67-100%) amount of necrotic tissue within the wound bed including Eschar. Wound #8 status is Open. Original cause of wound was Gradually Appeared. The date acquired was: 07/12/2019. The wound has been in treatment 14 weeks. The wound is located on the Left,Medial Ankle. The wound measures 4.5cm length x 2.4cm width x 0.1cm depth; 8.482cm^2 area and 0.848cm^3 volume. There is Fat Layer (Subcutaneous Tissue) exposed. There is no tunneling or undermining noted. There is a medium amount of serosanguineous drainage noted. The wound margin is flat and intact. There is medium (34-66%) red, pink granulation within the wound bed. There is a medium (34-66%) amount of necrotic tissue within the wound bed including Adherent Slough. 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 There was a Excisional Skin/Subcutaneous Tissue Debridement with a total area of 10.8 sq cm performed by Ricard Dillon, MD. With the following instrument(s): Curette to remove Viable and Non-Viable tissue/material. Material removed includes Subcutaneous Tissue and Slough and. A time out was conducted at 10:48, prior to the start of the procedure. A Minimum amount of bleeding was controlled with Pressure. The procedure was tolerated well. Post Debridement Measurements: 4.5cm length x 2.4cm width x 0.2cm depth; 1.696cm^3 volume. Character of Wound/Ulcer Post Debridement is stable. Severity of Tissue Post Debridement is: Fat layer exposed. Post procedure Diagnosis Wound #8: Same as Pre-Procedure Chad [name], Chad [name] ([id number]) Plan Follow-up Appointments: Return Appointment in 1 week. WOUND #6: - Toe Second Wound Laterality: Right Cleanser: Normal Saline 1 x Per Day/30 Days Discharge Instructions: Wash your hands with soap and water. Remove old dressing, discard into plastic bag and place into trash. Cleanse the wound with Normal  Saline prior to applying a clean dressing using gauze sponges, not tissues or cotton balls. Do not scrub or use excessive force. Pat dry using gauze sponges, not tissue or cotton balls. Primary Dressing: Silvercel Small 2x2 (in/in) (Generic) 1 x Per Day/30 Days Discharge Instructions: Apply Silvercel Small 2x2 (in/in) as instructed Secondary Dressing: Coverlet Latex-Free Fabric Adhesive Dressings 1 x Per Day/30 Days Discharge Instructions: Secure just the wounded toe WOUND #8: - Ankle Wound Laterality: Left, Medial Cleanser: Normal Saline 1 x Per Day/30 Days Discharge Instructions: Wash your hands with soap and water. Remove old dressing, discard into plastic bag and place into trash. Cleanse the wound with Normal Saline prior to applying a clean  dressing using gauze sponges, not tissues or cotton balls. Do not scrub or use excessive force. Pat dry using gauze sponges, not tissue or cotton balls. Peri-Wound Care: Desitin Maximum Strength Ointment 4 (oz) 1 x Per Day/30 Days Discharge Instructions: Zinc periwound Topical: Gentamicin 1 x Per Day/30 Days Discharge Instructions: Apply to wound bed lightly Primary Dressing: Silvercel Small 2x2 (in/in) 1 x Per Day/30 Days Discharge Instructions: Apply Silvercel Small 2x2 (in/in) as instructed Secondary Dressing: ABD Pad 5x9 (in/in) 1 x Per Day/30 Days Discharge Instructions: Cover with ABD pad Secondary Dressing: Kerlix 4.5 x 4.1 (in/yd) 1 x Per Day/30 Days Discharge Instructions: Apply Kerlix 4.5 x 4.1 (in/yd) as instructed Secured With: 45M Medipore H Soft Cloth Surgical Tape, 2x2 (in/yd) 1 x Per Day/30 Days 1. Continuing with silver cell to the right second toe that should be healed by the next time we see him 2. Left medial ankle we continued with the gentamicin lightly under silver cell. Electronic Signature(s) Signed: 11/22/2020 5:07:17 PM By: Linton Ham MD Entered By: Linton Ham on 11/22/2020 12:57:16 Chad [name] ([id number]) -------------------------------------------------------------------------------- [place] Details Patient Name: Chad [name] Date of Service: 11/22/2020 Medical Record Number: [id number] Patient Account Number: [id number] Date of Birth/Sex: 1960-09-03 (60 y.o. M) Treating RN: Dolan Amen Primary Care Provider: Clayborn Bigness Other Clinician: Referring Provider: Clayborn Bigness Treating Provider/Extender: Tito Dine in Treatment: 14 Diagnosis Coding ICD-10 Codes Code Description I87.2 Venous insufficiency (chronic) (peripheral) L97.322 Non-pressure chronic ulcer of left ankle with fat layer exposed L97.412 Non-pressure chronic ulcer of right heel and midfoot with fat layer exposed L97.512 Non-pressure chronic ulcer of other part  of right foot with fat layer exposed 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 side Facility Procedures CPT4 Code: [id number] Description: [id number] - DEB SUBQ TISSUE 20 SQ CM/< Modifier: Quantity: 1 CPT4 Code: Description: ICD-10 Diagnosis Description [id number] Non-pressure chronic ulcer of left ankle with fat layer exposed Modifier: Quantity: Physician Procedures CPT4 Code: [id number] Description: 11042 - WC PHYS SUBQ TISS 20 SQ CM Modifier: Quantity: 1 CPT4 Code: Description: ICD-10 Diagnosis Description [id number] Non-pressure chronic ulcer of left ankle with fat layer exposed Modifier: Quantity: Electronic Signature(s) Signed: 11/22/2020 5:07:17 PM By: Linton Ham MD Entered By: Linton Ham on 11/22/2020 12:57:27

## 2020-11-23 NOTE — Progress Notes (Signed)
Tumor Board Documentation  Chad [name] was presented by Dr [name] at our Tumor Board on 11/22/2020, which included representatives from medical oncology,radiation oncology,surgical oncology,internal medicine,navigation,pathology,radiology,surgical,genetics,research,palliative care,pulmonology.  Chad [name] currently presents as a current patient,for MDC,for new positive pathology with history of the following treatments: neoadjuvant chemotherapy,immunotherapy,active survellience.  Additionally, we reviewed previous medical and familial history, history of present illness, and recent lab results along with all available histopathologic and imaging studies. The tumor board considered available treatment options and made the following recommendations:   Referred to Dr Bary Castilla to see if he is a surgical candidate  The following procedures/referrals were also placed: No orders of the defined types were placed in this encounter.   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  Tumor board is a meeting of clinicians from various specialty areas who evaluate and discuss patients for whom a multidisciplinary approach is being considered. Final determinations in the plan of care are those of the provider(s). The responsibility for follow up of recommendations given during tumor board is that of the provider.   Today's extended care, comprehensive team conference, Chad [name] was not present for the discussion and was not examined.   Multidisciplinary Tumor Board is a multidisciplinary case peer review process.  Decisions discussed in the Multidisciplinary Tumor Board reflect the opinions of the specialists present at the conference without having examined the patient.  Ultimately, treatment and diagnostic decisions rest  with the primary provider(s) and the patient.

## 2020-11-23 NOTE — Progress Notes (Signed)
Chad [name] ([id number]) Visit Report for 11/22/2020 Arrival Information Details Patient Name: Chad [name], Chad [name] Date of Service: 11/22/2020 10:30 AM Medical Record Number: [id number] Patient Account Number: [id number] Date of Birth/Sex: 1961/03/07 (60 y.o. M) Treating RN: Carlene Coria Primary Care Graceson Nichelson: Clayborn Bigness Other Clinician: Referring Ikenna Ohms: Clayborn Bigness Treating Keliah Harned/Extender: Tito Dine in Treatment: 14 Visit Information History Since Last Visit All ordered tests and consults were completed: No Patient Arrived: Wheel Chair Added or deleted any medications: No Arrival Time: 10:38 Any new allergies or adverse reactions: No Accompanied By: self Had a fall or experienced change in No Transfer Assistance: None activities of daily living that may affect Patient Identification Verified: Yes risk of falls: Secondary Verification Process Completed: Yes Signs or symptoms of abuse/neglect since last visito No Patient Requires Transmission-Based No Hospitalized since last visit: No Precautions: Implantable device outside of the clinic excluding No Patient Has Alerts: Yes cellular tissue based products placed in the center Patient Alerts: Patient on Blood since last visit: Thinner Has Dressing in Place as Prescribed: Yes ABI right 1.16 Pain Present Now: No ABI left 1.14 Electronic Signature(s) Signed: 11/23/2020 8:24:32 AM By: Carlene Coria RN Entered By: Carlene Coria on 11/22/2020 10:38:45 Chad [name] ([id number]) -------------------------------------------------------------------------------- Clinic Level of Care Assessment Details Patient Name: Chad [name] Date of Service: 11/22/2020 10:30 AM Medical Record Number: [id number] Patient Account Number: [id number] Date of Birth/Sex: 1961/02/13 (60 y.o. M) Treating RN: Dolan Amen Primary Care Jearldean Gutt: Clayborn Bigness Other Clinician: Referring Praise Dolecki: Clayborn Bigness Treating Jovani Flury/Extender:  Tito Dine in Treatment: 14 Clinic Level of Care Assessment Items TOOL 1 Quantity Score []  - Use when EandM and Procedure is performed on INITIAL visit 0 ASSESSMENTS - Nursing Assessment / Reassessment []  - General Physical Exam (combine w/ comprehensive assessment (listed just below) when performed on new 0 pt. evals) []  - 0 Comprehensive Assessment (HX, ROS, Risk Assessments, Wounds Hx, etc.) ASSESSMENTS - Wound and Skin Assessment / Reassessment []  - Dermatologic / Skin Assessment (not related to wound area) 0 ASSESSMENTS - Ostomy and/or Continence Assessment and Care []  - Incontinence Assessment and Management 0 []  - 0 Ostomy Care Assessment and Management (repouching, etc.) PROCESS - Coordination of Care []  - Simple Patient / Family Education for ongoing care 0 []  - 0 Complex (extensive) Patient / Family Education for ongoing care []  - 0 Staff obtains Programmer, systems, Records, Test Results / Process Orders []  - 0 Staff telephones HHA, Nursing Homes / Clarify orders / etc []  - 0 Routine Transfer to another Facility (non-emergent condition) []  - 0 Routine Hospital Admission (non-emergent condition) []  - 0 New Admissions / Biomedical engineer / Ordering NPWT, Apligraf, etc. []  - 0 Emergency Hospital Admission (emergent condition) PROCESS - Special Needs []  - Pediatric / Minor Patient Management 0 []  - 0 Isolation Patient Management []  - 0 Hearing / Language / Visual special needs []  - 0 Assessment of Community assistance (transportation, D/C planning, etc.) []  - 0 Additional assistance / Altered mentation []  - 0 Support Surface(s) Assessment (bed, cushion, seat, etc.) INTERVENTIONS - Miscellaneous []  - External ear exam 0 []  - 0 Patient Transfer (multiple staff / Civil Service fast streamer / Similar devices) []  - 0 Simple Staple / Suture removal (25 or less) []  - 0 Complex Staple / Suture removal (26 or more) []  - 0 Hypo/Hyperglycemic Management (do not check if  billed separately) []  - 0 Ankle / Brachial Index (ABI) - do not check if billed separately Has the patient been seen at the  hospital within the last three years: Yes Total Score: 0 Level Of Care: ____ Chad [name] ([id number]) Electronic Signature(s) Signed: 11/22/2020 5:05:31 PM By: Georges Mouse, Minus Breeding RN Entered By: Georges Mouse, Minus Breeding on 11/22/2020 10:51:07 Chad [name] ([id number]) -------------------------------------------------------------------------------- Lower Extremity Assessment Details Patient Name: Chad [name] Date of Service: 11/22/2020 10:30 AM Medical Record Number: [id number] Patient Account Number: [id number] Date of Birth/Sex: 1961/02/07 (60 y.o. M) Treating RN: Carlene Coria Primary Care Uriyah Raska: Clayborn Bigness Other Clinician: Referring Tyniah Kastens: Clayborn Bigness Treating Madyx Delfin/Extender: Tito Dine in Treatment: 14 Vascular Assessment Pulses: Dorsalis Pedis Palpable: [Left:Yes] [Right:Yes] Electronic Signature(s) Signed: 11/23/2020 8:24:32 AM By: Carlene Coria RN Entered By: Carlene Coria on 11/22/2020 10:40:58 Chad [name] ([id number]) -------------------------------------------------------------------------------- Multi Wound Chart Details Patient Name: Chad [name] Date of Service: 11/22/2020 10:30 AM Medical Record Number: [id number] Patient Account Number: [id number] Date of Birth/Sex: [birth year] (60 y.o. M) Treating RN: Dolan Amen Primary Care Wenda Vanschaick: Clayborn Bigness Other Clinician: Referring Othel Hoogendoorn: Clayborn Bigness Treating Elvan Ebron/Extender: Tito Dine in Treatment: 14 Vital Signs Height(in): 26 Pulse(bpm): 73 Weight(lbs): 160 Blood Pressure(mmHg): 109/70 Body Mass Index(BMI): 25 Temperature(F): 98 Respiratory Rate(breaths/min): 18 Photos: [N/A:N/A] Wound Location: Right Toe Second Left, Medial Ankle N/A Wounding Event: Gradually Appeared Gradually Appeared N/A Primary Etiology: Abscess Venous Leg  Ulcer N/A Comorbid History: Anemia, Chronic Obstructive Anemia, Chronic Obstructive N/A Pulmonary Disease (COPD), Pulmonary Disease (COPD), Hepatitis C, Received Hepatitis C, Received Chemotherapy Chemotherapy Date Acquired: 03/28/2020 07/12/2019 N/A Weeks of Treatment: 14 14 N/A Wound Status: Open Open N/A Measurements L x W x D (cm) 0.1x0.1x0.1 4.5x2.4x0.1 N/A Area (cm) : 0.008 8.482 N/A Volume (cm) : 0.001 0.848 N/A % Reduction in Area: 99.30% -44.00% N/A % Reduction in Volume: 99.20% -44.00% N/A Classification: Full Thickness Without Exposed Full Thickness Without Exposed N/A Support Structures Support Structures Exudate Amount: None Present Medium N/A Exudate Type: N/A Serosanguineous N/A Exudate Color: N/A red, brown N/A Wound Margin: N/A Flat and Intact N/A Granulation Amount: None Present (0%) Medium (34-66%) N/A Granulation Quality: N/A Red, Pink N/A Necrotic Amount: Large (67-100%) Medium (34-66%) N/A Necrotic Tissue: Eschar Adherent Slough N/A Exposed Structures: Fascia: No Fat Layer (Subcutaneous Tissue): N/A Fat Layer (Subcutaneous Tissue): Yes No Fascia: No Tendon: No Tendon: No Muscle: No Muscle: No Joint: No Joint: No Bone: No Bone: No Epithelialization: Large (67-100%) Small (1-33%) N/A Debridement: N/A Debridement - Excisional N/A Pre-procedure Verification/Time N/A 10:48 N/A Out Taken: Tissue Debrided: N/A Subcutaneous, Slough N/A Level: N/A Skin/Subcutaneous Tissue N/A Debridement Area (sq cm): N/A 10.8 N/A Instrument: N/A Curette N/A Bleeding: N/A Minimum N/A Hemostasis Achieved: N/A Pressure N/A N/A Procedure was tolerated well N/A KHYRON, GARNO ([id number]) Debridement Treatment Response: Post Debridement N/A 4.5x2.4x0.2 N/A Measurements L x W x D (cm) Post Debridement Volume: N/A 1.696 N/A (cm) Procedures Performed: N/A Debridement N/A Treatment Notes Electronic Signature(s) Signed: 11/22/2020 5:07:17 PM By: Linton Ham  MD Entered By: Linton Ham on 11/22/2020 12:49:45 Chad [name] ([id number]) -------------------------------------------------------------------------------- Multi-Disciplinary Care Plan Details Patient Name: Chad [name] Date of Service: 11/22/2020 10:30 AM Medical Record Number: [id number] Patient Account Number: [id number] Date of Birth/Sex: Jul 18, 1961 (60 y.o. M) Treating RN: Dolan Amen Primary Care Jonice Cerra: Clayborn Bigness Other Clinician: Referring Nyoka Alcoser: Clayborn Bigness Treating Corliss Lamartina/Extender: Tito Dine in Treatment: 14 Active Inactive Electronic Signature(s) Signed: 11/22/2020 5:05:31 PM By: Georges Mouse, Minus Breeding RN Entered By: Georges Mouse, Minus Breeding on 11/22/2020 10:46:29 Chad [name] ([id number]) -------------------------------------------------------------------------------- Pain Assessment Details Patient Name: Chad [name] Date of Service: 11/22/2020 10:30 AM Medical Record Number: [id number] Patient  Account Number: [id number] Date of Birth/Sex: January 26, 1961 (60 y.o. M) Treating RN: Carlene Coria Primary Care Spence Soberano: Clayborn Bigness Other Clinician: Referring Anari Evitt: Clayborn Bigness Treating Danyiel Crespin/Extender: Tito Dine in Treatment: 14 Active Problems Location of Pain Severity and Description of Pain Patient Has Paino No Site Locations Pain Management and Medication Current Pain Management: Electronic Signature(s) Signed: 11/23/2020 8:24:32 AM By: Carlene Coria RN Entered By: Carlene Coria on 11/22/2020 10:39:11 Chad [name] ([id number]) -------------------------------------------------------------------------------- Patient/Caregiver Education Details Patient Name: Chad [name] Date of Service: 11/22/2020 10:30 AM Medical Record Number: [id number] Patient Account Number: [id number] Date of Birth/Gender: 08/09/1960 (60 y.o. M) Treating RN: Dolan Amen Primary Care Physician: Clayborn Bigness Other Clinician: Referring  Physician: Clayborn Bigness Treating Physician/Extender: Tito Dine in Treatment: 14 Education Assessment Education Provided To: Patient Education Topics Provided Wound/Skin Impairment: Methods: Explain/Verbal Responses: State content correctly [employer]) Signed: 11/22/2020 5:05:31 PM By: Georges Mouse, Minus Breeding RN Entered By: Georges Mouse, Minus Breeding on 11/22/2020 10:51:23 Chad [name] ([id number]) -------------------------------------------------------------------------------- Wound Assessment Details Patient Name: Chad [name] Date of Service: 11/22/2020 10:30 AM Medical Record Number: [id number] Patient Account Number: [id number] Date of Birth/Sex: 1960-12-21 (60 y.o. M) Treating RN: Carlene Coria Primary Care Emelee Rodocker: Clayborn Bigness Other Clinician: Referring Deaundra Dupriest: Clayborn Bigness Treating Ixel Boehning/Extender: Tito Dine in Treatment: 14 Wound Status Wound Number: 6 Primary Abscess Etiology: Wound Location: Right Toe Second Wound Open Wounding Event: Gradually Appeared Status: Date Acquired: 03/28/2020 Comorbid Anemia, Chronic Obstructive Pulmonary Disease (COPD), Weeks Of Treatment: 14 History: Hepatitis C, Received Chemotherapy Clustered Wound: No Photos Wound Measurements Length: (cm) 0.1 Width: (cm) 0.1 Depth: (cm) 0.1 Area: (cm) 0.008 Volume: (cm) 0.001 % Reduction in Area: 99.3% % Reduction in Volume: 99.2% Epithelialization: Large (67-100%) Tunneling: No Undermining: No Wound Description Classification: Full Thickness Without Exposed Support Structure Exudate Amount: None Present s Foul Odor After Cleansing: No Slough/Fibrino No Wound Bed Granulation Amount: None Present (0%) Exposed Structure Necrotic Amount: Large (67-100%) Fascia Exposed: No Necrotic Quality: Eschar Fat Layer (Subcutaneous Tissue) Exposed: No Tendon Exposed: No Muscle Exposed: No Joint Exposed: No Bone Exposed: No Electronic  Signature(s) Signed: 11/23/2020 8:24:32 AM By: Carlene Coria RN Entered By: Carlene Coria on 11/22/2020 10:40:03 Chad [name] ([id number]) -------------------------------------------------------------------------------- Wound Assessment Details Patient Name: Chad [name] Date of Service: 11/22/2020 10:30 AM Medical Record Number: [id number] Patient Account Number: [id number] Date of Birth/Sex: 1961/02/16 (60 y.o. M) Treating RN: Carlene Coria Primary Care Channon Ambrosini: Clayborn Bigness Other Clinician: Referring Arnita Koons: Clayborn Bigness Treating Keyatta Tolles/Extender: Tito Dine in Treatment: 14 Wound Status Wound Number: 8 Primary Venous Leg Ulcer Etiology: Wound Location: Left, Medial Ankle Wound Open Wounding Event: Gradually Appeared Status: Date Acquired: 07/12/2019 Comorbid Anemia, Chronic Obstructive Pulmonary Disease (COPD), Weeks Of Treatment: 14 History: Hepatitis C, Received Chemotherapy Clustered Wound: No Photos Wound Measurements Length: (cm) 4.5 Width: (cm) 2.4 Depth: (cm) 0.1 Area: (cm) 8.482 Volume: (cm) 0.848 % Reduction in Area: -44% % Reduction in Volume: -44% Epithelialization: Small (1-33%) Tunneling: No Undermining: No Wound Description Classification: Full Thickness Without Exposed Support Structu Wound Margin: Flat and Intact Exudate Amount: Medium Exudate Type: Serosanguineous Exudate Color: red, brown res Foul Odor After Cleansing: No Slough/Fibrino Yes Wound Bed Granulation Amount: Medium (34-66%) Exposed Structure Granulation Quality: Red, Pink Fascia Exposed: No Necrotic Amount: Medium (34-66%) Fat Layer (Subcutaneous Tissue) Exposed: Yes Necrotic Quality: Adherent Slough Tendon Exposed: No Muscle Exposed: No Joint Exposed: No Bone Exposed: No Electronic Signature(s) Signed: 11/23/2020 8:24:32 AM By: Carlene Coria RN Entered By: Carlene Coria on 11/22/2020  10:40:35 ABHIRAJ, DOZAL  ([id number]) -------------------------------------------------------------------------------- Vitals Details Patient Name: BRONC, BROSSEAU Date of Service: 11/22/2020 10:30 AM Medical Record Number: [id number] Patient Account Number: [id number] Date of Birth/Sex: 1961-01-28 (60 y.o. M) Treating RN: Carlene Coria Primary Care Fayette [place]: Clayborn Bigness Other Clinician: Referring Kryssa Risenhoover: Clayborn Bigness Treating Samarth Ogle/Extender: Tito Dine in Treatment: 14 Vital Signs Time Taken: 10:38 Temperature (F): 98 Height (in): 67 Pulse (bpm): 91 Weight (lbs): 160 Respiratory Rate (breaths/min): 18 Body Mass Index (BMI): 25.1 Blood Pressure (mmHg): 109/70 Reference Range: 80 - 120 mg / dl Electronic Signature(s) Signed: 11/23/2020 8:24:32 AM By: Carlene Coria RN Entered By: Carlene Coria on 11/22/2020 10:39:01

## 2020-11-30 ENCOUNTER — Encounter: Payer: Self-pay | Admitting: Nurse Practitioner

## 2020-11-30 ENCOUNTER — Other Ambulatory Visit: Payer: Self-pay

## 2020-11-30 ENCOUNTER — Ambulatory Visit: Payer: Medicaid Other | Admitting: Physician Assistant

## 2020-11-30 ENCOUNTER — Non-Acute Institutional Stay: Payer: Medicaid Other | Admitting: Nurse Practitioner

## 2020-11-30 VITALS — BP 115/56 | HR 88 | Temp 98.2°F | Resp 18 | Wt 159.6 lb

## 2020-11-30 DIAGNOSIS — G894 Chronic pain syndrome: Secondary | ICD-10-CM

## 2020-11-30 DIAGNOSIS — Z515 Encounter for palliative care: Secondary | ICD-10-CM

## 2020-11-30 DIAGNOSIS — R630 Anorexia: Secondary | ICD-10-CM

## 2020-11-30 NOTE — Progress Notes (Signed)
[facility] Palliative Care Consult Note Telephone: [phone]  Fax: [phone]    Date of encounter: 11/30/20 PATIENT NAME: [place] [id number]   [phone] (home)  DOB: Feb 28, 1961 MRN: [id number] PRIMARY CARE PROVIDER:   Dr [facility] [name], MD,  [place] [address] [phone]  RESPONSIBLE PARTY:    Contact Information    Name Relation Home Work Mobile   Kimmie, Doren   [phone]     I met face to face with patient facility. Palliative Care was asked to follow this patient by consultation request of  [name], MD to address advance care planning and complex medical decision making. This is a follow up visit  ASSESSMENT AND PLAN / RECOMMENDATIONS:   Advance Care Planning/Goals of Care: Goals include to maximize quality of life and symptom management. Our advance care planning conversation included a discussion about:     Symptom Management/Plan: 1.ACP: DNR; treat what is treatable including hospitalizations if necessary  2.Anorexia secondary to protein calorie malnutritionrelated to cancerimprovingongoing.Continue to monitor daily weights, supplements, supportive measures and courage to eat  3.Chronic pain monitor on pain scale, monitor efficacy vs adverse side effects.Will continue to monitor; encouraged to ask when in pain, Continue to follow by psychotherapy and psychiatry for counseling; supportive services.Continue with Oxycodone 79m q6 prn  4.Palliative care encounter;Palliative medicine team will continue to support patient, patient's family, and medical team. Visit consisted of counseling and education dealing with the complex and emotionally intense issues of symptom management and palliative care in the setting of serious and potentially life-threatening illness   Follow up Palliative Care Visit: Palliative care will  continue to follow for complex medical decision making, advance care planning, and clarification of goals. Return 4weeks or prn.  I spent 35 minutes providing this consultation. More than 50% of the time in this consultation was spent in counseling and care coordination.  PPS: 50%  HOSPICE ELIGIBILITY/DIAGNOSIS: TBD  Chief Complaint: Follow-up palliative consult for complex medical decision making  HISTORY OF PRESENT ILLNESS:  RHuston Stonehockeris a 60y.o. year old male  with lateonset CVA, urothelial cancer, prostate cancer, chronic hepatitis C, hyperlipidemia, colon surgery, protein calorie malnutrition, anxiety, depression.[place] [facility] [place]. Mr. LPetrosyantransfers himself to w/c, requires assistance for ADL's. Mr. LBollardfeeds himself with fair to declined appetite. Mr. LTennellcontinues to smoke daily. Continues to be followed wound center for bilateral lower extremity wounds. RRonnel Zuercherwas presented by Dr BRogue Bussingat our Tumor Board on 11/22/2020. 11/29/2020 care plan meeting completed, no changes to poc. Followed by Psychiatry with last visit 11/01/2020 for recurrent depression prescribed escitalopram with remeron. No changes at this visit. Staff endorses no new changes or concerns. Mr. LGilbergcontinues to take oxycodone 62mq6hrs for pain with effective relief. Staff endorses no new changes. At present, Mr. LiLitles sitting in the w/c in the front of the building. I talked with Mr. LiBhardwajbout PC visit. Mr. LiQuen agreement. We talked about how Mr. LiBastins feeling. Mr. LiMajettendorses he missed his wound care appointment today at the wound care center which has been rescheduled. Mr. LiFentressndorses aside from that he has been comfortable. We talked about his appetite. We talked about foods he likes. We talked about recent visit at Cancer center. We talked about his treatments and currently  tolerating. We reviewed about medical  goals of care. We talked about quality of life. We talked about residing at [facility]. We talked about mobility with w/c. We talked about sitting outside when he is able. We talked about smoking cessation which he declined. We talked about PC in POC. Discussed will continue to follow, monitor, support for chronic disease progression. Mr. Brindisi in agreement. I updated staff, no new changes.  History obtained from review of EMR, discussion with interview with facility staff and  Mr. Jungwirth. I reviewed available labs, medications, imaging, studies and related documents from the EMR.  Records reviewed and summarized above.   Dr [name] Oncology plan: # High-grade urothelial cancer/cytology; likely of the right renal pelvis /upper ureter. JAN 5th, 2022- CT findings worrisome for progressive right renal pelvis and ureteral tumor and mass suspicious for colonic neoplasm involving the descending colon sigmoid colon junction region [see below].  Mild soft tissue thickening around the distal right ureter could reflect ureteral tumor; FEB 2022-  RIGHT renal pelvis and bladder are not evaluable by FDG PET imaging due to high activity of radiotracer within the urine. STABLE; but see below re: sigmoid mass/colon cancer  # proceed with  [place] today; Labs today reviewed; Labs today reviewed;  acceptable for treatment today.   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  Options include surgery vs-continue surveillance given his multiple comorbidities /stage IV ureteral cancer. Referral to Dr.Byrnett.  Discussed with Dr. Bary Castilla.  # Iron deficiency anemia-hemoglobin 11.5; February 2022 ron studies/ferritin-LOW; plan Venofer with infusions;  STABLE.   # Bilateral LE ulcers-s/p wound care evaluation; s/p PTCA with Dr.Dew. bil Arterial Dopp-wnl; STABLE [Dr.]  ROS  Full 14  system review of systems performed and negative with exception of: as per HPI.  Physical Exam: Constitutional: NAD General: frail appearing, chronically ill, w/c dependent pleasant male EYES: lids intact ENMT: oral mucous membranes moist CV: S1S2, RRR, no LE edema Pulmonary: LCTA, no increased work of breathing, room air Abdomen:  normo-active BS + 4 quadrants, soft and non tender GU: deferred MSK: w/c dependent Skin: warm and dry, wounds to BLE; right elbow, forearm large epidermoid cyst Neuro:  + generalized weakness,  no cognitive impairment Psych: non-anxious affect, A and O x 3  Questions and concerns were addressed. The patient/family was encouraged to call with questions and/or concerns.  Provided general support and encouragement, no other unmet needs identified  Thank you for the opportunity to participate in the care of Mr. Huish.  The palliative care team will continue to follow. Please call our office at [phone] if we can be of additional assistance.   This chart was dictated using voice recognition software. Despite best efforts to proofread, errors can occur which can change the documentation meaning.   Daziya Redmond Ihor Gully, NP , MSN, [facility]

## 2020-12-07 ENCOUNTER — Other Ambulatory Visit: Payer: Self-pay

## 2020-12-07 ENCOUNTER — Encounter: Payer: Medicaid Other | Attending: Physician Assistant | Admitting: Physician Assistant

## 2020-12-07 DIAGNOSIS — I872 Venous insufficiency (chronic) (peripheral): Secondary | ICD-10-CM | POA: Diagnosis not present

## 2020-12-07 DIAGNOSIS — L97512 Non-pressure chronic ulcer of other part of right foot with fat layer exposed: Secondary | ICD-10-CM | POA: Diagnosis not present

## 2020-12-07 DIAGNOSIS — J449 Chronic obstructive pulmonary disease, unspecified: Secondary | ICD-10-CM | POA: Diagnosis not present

## 2020-12-07 DIAGNOSIS — I69354 Hemiplegia and hemiparesis following cerebral infarction affecting left non-dominant side: Secondary | ICD-10-CM | POA: Insufficient documentation

## 2020-12-07 DIAGNOSIS — L97412 Non-pressure chronic ulcer of right heel and midfoot with fat layer exposed: Secondary | ICD-10-CM | POA: Insufficient documentation

## 2020-12-07 DIAGNOSIS — L97322 Non-pressure chronic ulcer of left ankle with fat layer exposed: Secondary | ICD-10-CM | POA: Insufficient documentation

## 2020-12-07 NOTE — Progress Notes (Addendum)
HERBY, AMICK ([id number]) Visit Report for 12/07/2020 Chief Complaint Document Details Patient Name: Chad [name], Chad [name] Date of Service: 12/07/2020 9:00 AM Medical Record Number: [id number] Patient Account Number: [id number] Date of Birth/Sex: [birth year] (60 y.o. M) Treating RN: Dolan Amen Primary Care Provider: Clayborn Bigness Other Clinician: Referring Provider: Clayborn Bigness Treating Provider/Extender: Skipper Cliche in Treatment: 16 Information Obtained from: Patient Chief Complaint Left ankle and right foot and heel ulcers Electronic Signature(s) Signed: 12/07/2020 9:04:32 AM By: Worthy Keeler PA-C Entered By: Worthy Keeler on 12/07/2020 09:04:32 Chad [name] ([id number]) -------------------------------------------------------------------------------- HPI Details Patient Name: Chad [name] Date of Service: 12/07/2020 9:00 AM Medical Record Number: [id number] Patient Account Number: [id number] Date of Birth/Sex: Nov 22, 1960 (60 y.o. M) Treating RN: Dolan Amen Primary Care Provider: Clayborn Bigness Other Clinician: Referring Provider: Clayborn Bigness Treating Provider/Extender: Skipper Cliche in Treatment: 16 History of Present Illness HPI Description: 10/08/18 on evaluation today patient actually presents to our office for initial evaluation concerning wounds that he has of the bilateral lower extremities. 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 He also has bilateral venous stasis. He apparently has been homeless currently following discharge from the hospital apparently he has been placed at [facility] which is is a skilled nursing facility locally. Nonetheless fortunately he does not show any signs of infection at this time which is good news. In fact several of the wound actually appears to be showing some  signs of improvement already in my pinion. There are a couple areas in the left leg in particular there likely gonna require some sharp debridement to help clear away some necrotic tissue and help with more sufficient healing. No fevers, chills, nausea, or vomiting noted at this time. 10/15/18 on evaluation today patient actually appears to be doing very well in regard to his bilateral lower extremities. He's been tolerating the dressing changes without complication. Fortunately there does not appear to be any evidence of active infection at this time which is great news. Overall I'm actually very pleased with how this has progressed in just one visits time. Readmission: 08/14/2020 upon evaluation today patient presents for re-evaluation here in our clinic. He is having issues with his left ankle region as well as his right toe and his right heel. He tells me that the toe and heel actually began as a area that was itching that he was scratching and then subsequently opened up into wounds. These may have been abscess areas I presume based on what I am seeing currently. With regard to his left ankle region he tells me this was a similar type occurrence although he does have venous stasis this very well may be more of a venous leg ulcer more than anything. Nonetheless I do believe that the patient would benefit from appropriate and aggressive wound care to try to help get things under better control here. He does have history of a stroke on the left side affecting him to some degree there that he is able to stand although he does have some residual weakness. Otherwise again the patient does have chronic venous insufficiency as previously noted. His arterial studies most recently obtained showed that he had an ABI on the right of 1.16 with a TBI of 0.52 and on the left and ABI of 1.14 with a TBI of 0.81. That was obtained on 06/19/2020. 08/28/2020 upon evaluation today  patient appears to be doing decently  well in regard to his wounds in general. He has been tolerating the dressing changes without complication. Fortunately there does not appear to be any signs of active infection which is great news. With that being said I think the [facility] is doing a good job I would recommend that we likely continue with that currently. 09/11/2020 upon evaluation today patient's wounds did not appear to be doing too poorly but again he is not really showing signs of significant improvement with regard to any of the wounds on the right. None of them have Hydrofera Blue on them I am not exactly sure why this is not being followed as the facility did not contact us to let us know of any issues with obtaining dressings or otherwise. With that being said he is supposed to be using Hydrofera Blue on both of the wounds on the right foot as well as the ankle wound on the left side. 09/18/2020 upon evaluation today patient appears to be doing poorly with regard to his wounds. Again right now the left ankle in particular showed signs of extreme maceration. Apparently he was told by someone with staff at [facility] they could not get the [facility]. With that being said this is something that is never been relayed to [place] one way or another. Also the patient subsequently has not supposed to have a border gauze dressing on. He should have an ABD pad and roll gauze to secure as this drains much too much just to have a border gauze dressing to cover. Nonetheless the fact that they are not using the appropriate dressing is directly causing deterioration of the left ankle wound it is significantly worse today compared to what it was previous. I did attempt to call [facility] while the patient was here I called three times and got no one to even pick up the phone. After this I had my for an office coordinator call and she was able to finally get through and leave a message with the D ON as of dictation of this  note which is roughly about an hour and a half later I still have not been able to speak with anyone at the facility. 09/25/2020 upon evaluation today patient actually showing signs of good improvement which is excellent news. He has been tolerating the dressing changes without complication. Fortunately there is no signs of active infection which is great news. No fevers, chills, nausea, vomiting, or diarrhea. I do feel like the facility has been doing a much better job at taking care of him as far as the dressings are concerned. However the director of nursing never did call me back. 10/09/2020 upon evaluation today patient appears to be doing well with regard to his wound. The toe ulcer did require some debridement but the other 2 areas actually appear to be doing quite well. 10/19/2020 upon evaluation today patient actually appears to be doing very well in regard to his wounds. In fact the heel does appear to be completely healed. The toe is doing better in the medial ankle on the left is also doing better. Overall I think he is headed in the right direction. 10/26/2020 upon evaluation today patient appears to be doing well with regard to his wound. He is showing signs of improvement which is great news and overall I am very pleased with where things stand today. No fevers, chills, nausea, vomiting, or diarrhea. 11/02/2020 upon evaluation today patient appears to  be doing well with regard to his wounds. He has been tolerating the dressing changes without complication overall I am extremely pleased with where things stand today. He in regard to the toe is almost completely healed and the medial ankle on the left is doing much better. 11/09/2020 upon evaluation today patient appears to be doing a little poorly in regard to his left medial ankle ulcer. Fortunately there does not appear to be any signs of systemic infection but unfortunately locally he does appear to be infected in fact he has blue-green  drainage consistent with Pseudomonas. PILOT, PRINDLE ([id number]) 11/16/2020 upon evaluation today patient appears to be doing well with regard to his wound. It actually appears to be doing better. I did place him on gentamicin cream since the Cipro was actually resistant even though he was positive for Pseudomonas on culture. Overall I think that he does seem to be doing better though I am unsure whether or not they have actually been putting the cream on. The patient is not sure that we did talk to the nurse directly and she was going to initiate that treatment. Fortunately there does not appear to be any signs of active infection at this time. No fevers, chills, nausea, vomiting, or diarrhea. 4/28; the area on the right second toe is close to healed. Left medial ankle required debridement 12/07/2020 upon evaluation today patient appears to be doing well with regard to his wounds. In fact the right second toe appears to be completely healed which is great news. Fortunately there does not appear to be any signs of active infection at this time which is also great news. I think we can probably discontinue the gentamicin on top of everything else. Electronic Signature(s) Signed: 12/07/2020 9:12:41 AM By: Worthy Keeler PA-C Entered By: Worthy Keeler on 12/07/2020 09:12:41 Chad [name] ([id number]) -------------------------------------------------------------------------------- Physical Exam Details Patient Name: Chad [name] Date of Service: 12/07/2020 9:00 AM Medical Record Number: [id number] Patient Account Number: [id number] Date of Birth/Sex: 01-15-1961 (60 y.o. M) Treating RN: Dolan Amen Primary Care Provider: Clayborn Bigness Other Clinician: Referring Provider: Clayborn Bigness Treating Provider/Extender: Skipper Cliche in Treatment: 56 Constitutional Well-nourished and well-hydrated in no acute distress. Respiratory normal breathing without difficulty. Psychiatric this patient  is able to make decisions and demonstrates good insight into disease process. Alert and Oriented x 3. pleasant and cooperative. Notes With regard to the patient's second toe this is completely healed and I am very pleased in that regard. There does not appear to be any signs of infection and I think that probably were just going to be using a small Band-Aid here just for protection. With regard to the left ankle no sharp debridement was necessary though I do believe that the patient is doing excellent here as well. I am very pleased with the overall appearance of the wound. I think we can switch over to collagen. Electronic Signature(s) Signed: 12/07/2020 9:13:12 AM By: Worthy Keeler PA-C Entered By: Worthy Keeler on 12/07/2020 09:13:11 Chad [name] ([id number]) -------------------------------------------------------------------------------- Physician Orders Details Patient Name: Chad [name] Date of Service: 12/07/2020 9:00 AM Medical Record Number: [id number] Patient Account Number: [id number] Date of Birth/Sex: [birth year] (60 y.o. 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 Non-Wound Condition o Additional non-wound orders/instructions: - Keep band-aid on right 2nd toe for another 2 weeks until the skin gets tougher. Wound  Treatment Wound #8 - Ankle Wound Laterality: Left, Medial Cleanser: Normal Saline 1 x Per Day/30 Days Discharge Instructions: Wash your hands with soap and water. Remove old dressing, discard into plastic bag and place into trash. Cleanse the wound with Normal Saline prior to applying a clean dressing using gauze sponges, not tissues or cotton balls. Do not scrub or use excessive force. Pat dry using gauze sponges, not tissue or cotton balls. Peri-Wound Care: Desitin Maximum Strength Ointment 4 (oz) 1 x Per Day/30 Days Discharge Instructions: Zinc periwound Primary Dressing: Prisma 4.34 (in) 1 x Per Day/30 Days Discharge Instructions: Moisten w/normal saline or sterile water; Cover wound as directed. Do not remove from wound bed. Secondary Dressing: ABD Pad 5x9 (in/in) 1 x Per Day/30 Days Discharge Instructions: Cover with ABD pad Secondary Dressing: Kerlix 4.5 x 4.1 (in/yd) 1 x Per Day/30 Days Discharge Instructions: Apply Kerlix 4.5 x 4.1 (in/yd) as instructed Secured With: 36M Medipore H Soft Cloth Surgical Tape, 2x2 (in/yd) 1 x Per Day/30 Days Electronic Signature(s) Signed: 12/07/2020 5:04:08 PM By: Gretta Cool, BSN, RN, CWS, Kim RN, BSN Signed: 12/10/2020 5:15:41 PM By: Worthy Keeler PA-C Entered By: Gretta Cool, BSN, RN, CWS, Kim on 12/07/2020 09:10:57 Chad [name] ([id number]) -------------------------------------------------------------------------------- Problem List Details Patient Name: Chad [name] Date of Service: 12/07/2020 9:00 AM Medical Record Number: [id number] Patient Account Number: [id number] Date of Birth/Sex: [birth year] (60 y.o. M) Treating RN: Dolan Amen Primary Care Provider: Clayborn Bigness Other Clinician: Referring Provider: Clayborn Bigness Treating Provider/Extender: Skipper Cliche in Treatment: 16 Active Problems ICD-10 Encounter Code Description Active Date MDM Diagnosis I87.2 Venous insufficiency (chronic) (peripheral) 08/14/2020 No Yes L97.322 Non-pressure  chronic ulcer of left ankle with fat layer exposed 08/14/2020 No Yes L97.412 Non-pressure chronic ulcer of right heel and midfoot with fat layer 08/14/2020 No Yes exposed L97.512 Non-pressure chronic ulcer of other part of right foot with fat layer 08/14/2020 No Yes exposed 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 M) Treating RN: Dolan Amen Primary Care Provider: Clayborn Bigness Other Clinician: Referring Provider: Clayborn Bigness Treating Provider/Extender: Skipper Cliche in Treatment: 16 Subjective Chief Complaint Information obtained from Patient Left ankle and right foot and heel ulcers History of Present Illness (HPI) 10/08/18 on evaluation today patient actually presents to our office for initial evaluation concerning wounds that he has of the bilateral lower extremities. 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 He also has bilateral venous stasis. He apparently has been homeless currently following discharge from the hospital apparently he has been placed at [facility] which is is a skilled nursing facility locally. Nonetheless fortunately he does  not show any signs of infection at this time which is good news. In fact several of the wound actually appears to be showing some signs of  improvement already in my pinion. There are a couple areas in the left leg in particular there likely gonna require some sharp debridement to help clear away some necrotic tissue and help with more sufficient healing. No fevers, chills, nausea, or vomiting noted at this time. 10/15/18 on evaluation today patient actually appears to be doing very well in regard to his bilateral lower extremities. He's been tolerating the dressing changes without complication. Fortunately there does not appear to be any evidence of active infection at this time which is great news. Overall I'm actually very pleased with how this has progressed in just one visits time. Readmission: 08/14/2020 upon evaluation today patient presents for re-evaluation here in our clinic. He is having issues with his left ankle region as well as his right toe and his right heel. He tells me that the toe and heel actually began as a area that was itching that he was scratching and then subsequently opened up into wounds. These may have been abscess areas I presume based on what I am seeing currently. With regard to his left ankle region he tells me this was a similar type occurrence although he does have venous stasis this very well may be more of a venous leg ulcer more than anything. Nonetheless I do believe that the patient would benefit from appropriate and aggressive wound care to try to help get things under better control here. He does have history of a stroke on the left side affecting him to some degree there that he is able to stand although he does have some residual weakness. Otherwise again the patient does have chronic venous insufficiency as previously noted. His arterial studies most recently obtained showed that he had an ABI on the right of 1.16 with a TBI of 0.52 and on the left and ABI of 1.14 with a TBI of 0.81. That was obtained on 06/19/2020. 08/28/2020 upon evaluation today patient appears to be doing decently well in  regard to his wounds in general. He has been tolerating the dressing changes without complication. Fortunately there does not appear to be any signs of active infection which is great news. With that being said I think the [facility] is doing a good job I would recommend that we likely continue with that currently. 09/11/2020 upon evaluation today patient's wounds did not appear to be doing too poorly but again he is not really showing signs of significant improvement with regard to any of the wounds on the right. None of them have Hydrofera Blue on them I am not exactly sure why this is not being followed as the facility did not contact us to let us know of any issues with obtaining dressings or otherwise. With that being said he is supposed to be using Hydrofera Blue on both of the wounds on the right foot as well as the ankle wound on the left side. 09/18/2020 upon evaluation today patient appears to be doing poorly with regard to his wounds. Again right now the left ankle in particular showed signs of extreme maceration. Apparently he was told by someone with staff at [facility] they could not get the [facility]. With that being said this is something that is never been relayed to [place] one way or another. Also the patient subsequently has not supposed to have a border gauze  dressing on. He should have an ABD pad and roll gauze to secure as this drains much too much just to have a border gauze dressing to cover. Nonetheless the fact that they are not using the appropriate dressing is directly causing deterioration of the left ankle wound it is significantly worse today compared to what it was previous. I did attempt to call [facility] while the patient was here I called three times and got no one to even pick up the phone. After this I had my for an office coordinator call and she was able to finally get through and leave a message with the D ON as of dictation of this note  which is roughly about an hour and a half later I still have not been able to speak with anyone at the facility. 09/25/2020 upon evaluation today patient actually showing signs of good improvement which is excellent news. He has been tolerating the dressing changes without complication. Fortunately there is no signs of active infection which is great news. No fevers, chills, nausea, vomiting, or diarrhea. I do feel like the facility has been doing a much better job at taking care of him as far as the dressings are concerned. However the director of nursing never did call me back. 10/09/2020 upon evaluation today patient appears to be doing well with regard to his wound. The toe ulcer did require some debridement but the other 2 areas actually appear to be doing quite well. 10/19/2020 upon evaluation today patient actually appears to be doing very well in regard to his wounds. In fact the heel does appear to be completely healed. The toe is doing better in the medial ankle on the left is also doing better. Overall I think he is headed in the right direction. 10/26/2020 upon evaluation today patient appears to be doing well with regard to his wound. He is showing signs of improvement which is great news and overall I am very pleased with where things stand today. No fevers, chills, nausea, vomiting, or diarrhea. 11/02/2020 upon evaluation today patient appears to be doing well with regard to his wounds. He has been tolerating the dressing changes without complication overall I am extremely pleased with where things stand today. He in regard to the toe is almost completely healed and the medial [facility]rie ([id number]) ankle on the left is doing much better. 11/09/2020 upon evaluation today patient appears to be doing a little poorly in regard to his left medial ankle ulcer. Fortunately there does not appear to be any signs of systemic infection but unfortunately locally he does appear to be infected in  fact he has blue-green drainage consistent with Pseudomonas. 11/16/2020 upon evaluation today patient appears to be doing well with regard to his wound. It actually appears to be doing better. I did place him on gentamicin cream since the Cipro was actually resistant even though he was positive for Pseudomonas on culture. Overall I think that he does seem to be doing better though I am unsure whether or not they have actually been putting the cream on. The patient is not sure that we did talk to the nurse directly and she was going to initiate that treatment. Fortunately there does not appear to be any signs of active infection at this time. No fevers, chills, nausea, vomiting, or diarrhea. 4/28; the area on the right second toe is close to healed. Left medial ankle required debridement 12/07/2020 upon evaluation today patient appears to be doing well with regard  to his wounds. In fact the right second toe appears to be completely healed which is great news. Fortunately there does not appear to be any signs of active infection at this time which is also great news. I think we can probably discontinue the gentamicin on top of everything else. Objective Constitutional Well-nourished and well-hydrated in no acute distress. Vitals Time Taken: 8:45 AM, Height: 67 in, Weight: 160 lbs, BMI: 25.1, Temperature: 98.1 F, Pulse: 78 bpm, Respiratory Rate: 18 breaths/min, Blood Pressure: 114/75 mmHg. Respiratory normal breathing without difficulty. Psychiatric this patient is able to make decisions and demonstrates good insight into disease process. Alert and Oriented x 3. pleasant and cooperative. General Notes: With regard to the patient's second toe this is completely healed and I am very pleased in that regard. There does not appear to be any signs of infection and I think that probably were just going to be using a small Band-Aid here just for protection. With regard to the left ankle no sharp  debridement was necessary though I do believe that the patient is doing excellent here as well. I am very pleased with the overall appearance of the wound. I think we can switch over to collagen. Integumentary (Hair, Skin) Wound #6 status is Healed - Epithelialized. Original cause of wound was Gradually Appeared. The date acquired was: 03/28/2020. The wound has been in treatment 16 weeks. The wound is located on the Right Toe Second. The wound measures 0cm length x 0cm width x 0cm depth; 0cm^2 area and 0cm^3 volume. There is no tunneling or undermining noted. There is a none present amount of drainage noted. There is no granulation within the wound bed. There is a large (67-100%) amount of necrotic tissue within the wound bed including Eschar. Wound #8 status is Open. Original cause of wound was Gradually Appeared. The date acquired was: 07/12/2019. The wound has been in treatment 16 weeks. The wound is located on the Left,Medial Ankle. The wound measures 3.2cm length x 2cm width x 0.1cm depth; 5.027cm^2 area and 0.503cm^3 volume. There is Fat Layer (Subcutaneous Tissue) exposed. There is no tunneling or undermining noted. There is a medium amount of serous drainage noted. The wound margin is flat and intact. There is medium (34-66%) red, pink granulation within the wound bed. There is a small (1-33%) amount of necrotic tissue within the wound bed including Adherent Slough. 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 Non-Wound Condition: Additional non-wound  orders/instructions: - Keep band-aid on right 2nd toe for another 2 weeks until the skin gets tougher. WOUND #8: - Ankle Wound Laterality: Left, Medial Cleanser: Normal Saline 1 x Per Day/30 Days Discharge Instructions: Wash your hands with soap and water. Remove old dressing, discard into plastic bag and place into trash. Cleanse the wound with Normal Saline prior to applying a clean dressing using gauze sponges, not tissues or cotton balls. Do not scrub or use excessive force. Pat dry using gauze sponges, not tissue or cotton balls. Peri-Wound Care: Desitin Maximum Strength Ointment 4 (oz) 1 x Per Day/30 Days Discharge Instructions: Zinc periwound Primary Dressing: Prisma 4.34 (in) 1 x Per Day/30 Days Discharge Instructions: Moisten w/normal saline or  sterile water; Cover wound as directed. Do not remove from wound bed. Secondary Dressing: ABD Pad 5x9 (in/in) 1 x Per Day/30 Days Discharge Instructions: Cover with ABD pad Secondary Dressing: Kerlix 4.5 x 4.1 (in/yd) 1 x Per Day/30 Days Discharge Instructions: Apply Kerlix 4.5 x 4.1 (in/yd) as instructed Secured With: 75M Medipore H Soft Cloth Surgical Tape, 2x2 (in/yd) 1 x Per Day/30 Days 1. Would recommend currently that we going to continue with the wound care measures as before and the patient is in agreement with the plan. This includes the use of the silver collagen that will be the one change. With that being said I am also going to recommend that we continue to wrap the leg and not put a sticky bandage on this as he is never done well with that in the past. 2. I am also can recommend that we use just a small coverlet/Band-Aid over the right second toe for protection to let this area toughen up. We will see patient back for reevaluation in 1 week here in the clinic. If anything worsens or changes patient will contact our office for additional recommendations. Electronic Signature(s) Signed: 12/07/2020 9:13:56 AM By: Worthy Keeler  PA-C Entered By: Worthy Keeler on 12/07/2020 09:13:56 Chad [name] ([id number]) -------------------------------------------------------------------------------- SuperBill Details Patient Name: Chad [name] Date of Service: 12/07/2020 Medical Record Number: [id number] Patient Account Number: [id number] Date of Birth/Sex: 1960/08/27 (60 y.o. M) Treating RN: Dolan Amen Primary Care Provider: Clayborn Bigness Other Clinician: Referring Provider: Clayborn Bigness Treating Provider/Extender: Skipper Cliche in Treatment: 16 Diagnosis Coding ICD-10 Codes Code Description I87.2 Venous insufficiency (chronic) (peripheral) L97.322 Non-pressure chronic ulcer of left ankle with fat layer exposed L97.412 Non-pressure chronic ulcer of right heel and midfoot with fat layer exposed L97.512 Non-pressure chronic ulcer of other part of right foot with fat layer exposed 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 side Facility Procedures CPT4 Code: 66294765 Description: 99213 - WOUND CARE VISIT-LEV 3 EST PT Modifier: Quantity: 1 Physician Procedures CPT4 Code: 4650354 Description: 99213 - WC PHYS LEVEL 3 - EST PT Modifier: Quantity: 1 CPT4 Code: Description: ICD-10 Diagnosis Description I87.2 Venous insufficiency (chronic) (peripheral) L97.322 Non-pressure chronic ulcer of left ankle with fat layer exposed L97.412 Non-pressure chronic ulcer of right heel and midfoot with fat layer exp L97.512  Non-pressure chronic ulcer of other part of right foot with fat layer e Modifier: osed xposed Quantity: Electronic Signature(s) Signed: 12/07/2020 9:14:13 AM By: Worthy Keeler PA-C Entered By: Worthy Keeler on 12/07/2020 09:14:12

## 2020-12-07 NOTE — Progress Notes (Signed)
Chad, [name] ([id number]) Visit Report for 12/07/2020 Arrival Information Details Patient Name: Chad [name], Chad [name] Date of Service: 12/07/2020 9:00 AM Medical Record Number: [id number] Patient Account Number: [id number] Date of Birth/Sex: [birth year] (60 y.o. M) Treating RN: Dolan Amen Primary Care : Clayborn Bigness Other Clinician: Referring : Clayborn Bigness Treating /Extender: Skipper Cliche in Treatment: 50 Visit Information History Since Last Visit Added or deleted any medications: No Patient Arrived: Wheel Chair Had a fall or experienced change in No Arrival Time: 08:46 activities of daily living that may affect Accompanied By: self risk of falls: Transfer Assistance: None Hospitalized since last visit: No Patient Identification Verified: Yes Pain Present Now: No Secondary Verification Process Completed: Yes Patient Requires Transmission-Based No Precautions: Patient Has Alerts: Yes Patient Alerts: Patient on Blood Thinner ABI right 1.16 ABI left 1.14 Electronic Signature(s) Signed: 12/07/2020 11:20:32 AM By: Jeanine Luz Entered By: Jeanine Luz on 12/07/2020 08:46:21 Chad [name] ([id number]) -------------------------------------------------------------------------------- Clinic Level of Care Assessment Details Patient Name: Chad [name] Date of Service: 12/07/2020 9:00 AM Medical Record Number: [id number] Patient Account Number: [id number] Date of Birth/Sex: 1960-10-06 (60 y.o. M) Treating RN: Cornell Barman Primary Care : Clayborn Bigness Other Clinician: Referring : Clayborn Bigness Treating /Extender: Skipper Cliche in Treatment: 16 Clinic Level of Care Assessment Items TOOL 4 Quantity Score [] - Use when only an EandM is performed on FOLLOW-UP visit 0 ASSESSMENTS - Nursing Assessment / Reassessment X - Reassessment of Co-morbidities (includes updates in patient status) 1 10 X- 1 5 Reassessment of Adherence to  Treatment Plan ASSESSMENTS - Wound and Skin Assessment / Reassessment [] - Simple Wound Assessment / Reassessment - one wound 0 X- 2 5 Complex Wound Assessment / Reassessment - multiple wounds [] - 0 Dermatologic / Skin Assessment (not related to wound area) ASSESSMENTS - Focused Assessment [] - Circumferential Edema Measurements - multi extremities 0 [] - 0 Nutritional Assessment / Counseling / Intervention [] - 0 Lower Extremity Assessment (monofilament, tuning fork, pulses) [] - 0 Peripheral Arterial Disease Assessment (using hand held doppler) ASSESSMENTS - Ostomy and/or Continence Assessment and Care [] - Incontinence Assessment and Management 0 [] - 0 Ostomy Care Assessment and Management (repouching, etc.) PROCESS - Coordination of Care X - Simple Patient / Family Education for ongoing care 1 15 [] - 0 Complex (extensive) Patient / Family Education for ongoing care X- 1 10 Staff obtains Programmer, systems, Records, Test Results / Process Orders [] - 0 Staff telephones HHA, Nursing Homes / Clarify orders / etc [] - 0 Routine Transfer to another Facility (non-emergent condition) [] - 0 Routine Hospital Admission (non-emergent condition) [] - 0 New Admissions / Biomedical engineer / Ordering NPWT, Apligraf, etc. [] - 0 Emergency Hospital Admission (emergent condition) X- 1 10 Simple Discharge Coordination [] - 0 Complex (extensive) Discharge Coordination PROCESS - Special Needs [] - Pediatric / Minor Patient Management 0 [] - 0 Isolation Patient Management [] - 0 Hearing / Language / Visual special needs [] - 0 Assessment of Community assistance (transportation, D/C planning, etc.) [] - 0 Additional assistance / Altered mentation [] - 0 Support Surface(s) Assessment (bed, cushion, seat, etc.) INTERVENTIONS - Wound Cleansing / Measurement Case, Rmani ([id number]) [] - 0 Simple Wound Cleansing - one wound X- 2 5 Complex Wound Cleansing - multiple wounds X- 1  5 Wound Imaging (photographs - any number of wounds) [] - 0 Wound Tracing (instead of photographs) [] - 0 Simple Wound Measurement - one wound X- 2 5 Complex Wound Measurement -  multiple wounds INTERVENTIONS - Wound Dressings [] - Small Wound Dressing one or multiple wounds 0 X- 1 15 Medium Wound Dressing one or multiple wounds [] - 0 Large Wound Dressing one or multiple wounds [] - 0 Application of Medications - topical [] - 0 Application of Medications - injection INTERVENTIONS - Miscellaneous [] - External ear exam 0 [] - 0 Specimen Collection (cultures, biopsies, blood, body fluids, etc.) [] - 0 Specimen(s) / Culture(s) sent or taken to Lab for analysis [] - 0 Patient Transfer (multiple staff / Civil Service fast streamer / Similar devices) [] - 0 Simple Staple / Suture removal (25 or less) [] - 0 Complex Staple / Suture removal (26 or more) [] - 0 Hypo / Hyperglycemic Management (close monitor of Blood Glucose) [] - 0 Ankle / Brachial Index (ABI) - do not check if billed separately X- 1 5 Vital Signs Has the patient been seen at the hospital within the last three years: Yes Total Score: 105 Level Of Care: New/Established - Level 3 Electronic Signature(s) Signed: 12/07/2020 5:04:08 PM By: Gretta Cool, BSN, RN, CWS, Kim RN, BSN Entered By: Gretta Cool, BSN, RN, CWS, Kim on 12/07/2020 09:11:53 Chad [name] ([id number]) -------------------------------------------------------------------------------- Encounter Discharge Information Details Patient Name: Chad [name] Date of Service: 12/07/2020 9:00 AM Medical Record Number: [id number] Patient Account Number: [id number] Date of Birth/Sex: November 16, 1960 (60 y.o. M) Treating RN: Donnamarie Poag Primary Care Daundre Biel: Clayborn Bigness Other Clinician: Referring Kaleeyah Cuffie: Clayborn Bigness Treating Alegra Rost/Extender: Skipper Cliche in Treatment: 16 Encounter Discharge Information Items Discharge Condition: Stable Ambulatory Status: Wheelchair Discharge  Destination: Skilled Nursing Facility Telephoned: No Orders Sent: Yes Transportation: Other Accompanied By: self Schedule Follow-up Appointment: Yes Clinical Summary of Care: Electronic Signature(s) Signed: 12/07/2020 10:34:15 AM By: Donnamarie Poag Entered By: Donnamarie Poag on 12/07/2020 09:29:33 Chad [name] ([id number]) -------------------------------------------------------------------------------- Lower Extremity Assessment Details Patient Name: Chad [name] Date of Service: 12/07/2020 9:00 AM Medical Record Number: [id number] Patient Account Number: [id number] Date of Birth/Sex: 1960/10/02 (60 y.o. M) Treating RN: Dolan Amen Primary Care Jette Lewan: Clayborn Bigness Other Clinician: Referring Tariq Pernell: Clayborn Bigness Treating Esten Dollar/Extender: Jeri Cos Weeks in Treatment: 16 Electronic Signature(s) Signed: 12/07/2020 11:20:32 AM By: Jeanine Luz Signed: 12/07/2020 11:53:36 AM By: Georges Mouse, Minus Breeding RN Entered By: Jeanine Luz on 12/07/2020 08:57:45 Chad [name] ([id number]) -------------------------------------------------------------------------------- Multi Wound Chart Details Patient Name: Chad [name] Date of Service: 12/07/2020 9:00 AM Medical Record Number: [id number] Patient Account Number: [id number] Date of Birth/Sex: 1961/04/26 (60 y.o. M) Treating RN: Cornell Barman Primary Care Talan Gildner: Clayborn Bigness Other Clinician: Referring Ronnald Shedden: Clayborn Bigness Treating Jobin Montelongo/Extender: Skipper Cliche in Treatment: 16 Vital Signs Height(in): 41 Pulse(bpm): 72 Weight(lbs): 160 Blood Pressure(mmHg): 114/75 Body Mass Index(BMI): 25 Temperature(F): 98.1 Respiratory Rate(breaths/min): 18 Photos: [N/A:N/A] Wound Location: Right Toe Second Left, Medial Ankle N/A Wounding Event: Gradually Appeared Gradually Appeared N/A Primary Etiology: Abscess Venous Leg Ulcer N/A Comorbid History: Anemia, Chronic Obstructive Anemia, Chronic Obstructive N/A Pulmonary Disease  (COPD), Pulmonary Disease (COPD), Hepatitis C, Received Hepatitis C, Received Chemotherapy Chemotherapy Date Acquired: 03/28/2020 07/12/2019 N/A Weeks of Treatment: 16 16 N/A Wound Status: Healed - Epithelialized Open N/A Measurements L x W x D (cm) 0x0x0 3.2x2x0.1 N/A Area (cm) : 0 5.027 N/A Volume (cm) : 0 0.503 N/A % Reduction in Area: 100.00% 14.70% N/A % Reduction in Volume: 100.00% 14.60% N/A Classification: Full Thickness Without Exposed Full Thickness Without Exposed N/A Support Structures Support Structures Exudate Amount: None Present Medium N/A Exudate Type: N/A Serous N/A Exudate Color: N/A amber N/A Wound Margin: N/A Flat  and Intact N/A Granulation Amount: None Present (0%) Medium (34-66%) N/A Granulation Quality: N/A Red, Pink N/A Necrotic Amount: Large (67-100%) Small (1-33%) N/A Necrotic Tissue: Eschar Adherent Slough N/A Exposed Structures: Fascia: No Fat Layer (Subcutaneous Tissue): N/A Fat Layer (Subcutaneous Tissue): Yes No Fascia: No Tendon: No Tendon: No Muscle: No Muscle: No Joint: No Joint: No Bone: No Bone: No Epithelialization: Large (67-100%) Small (1-33%) N/A Treatment Notes Electronic Signature(s) Signed: 12/07/2020 5:04:08 PM By: Gretta Cool, BSN, RN, CWS, Kim RN, BSN Entered By: Gretta Cool, BSN, RN, CWS, Kim on 12/07/2020 09:08:48 Chad [name] ([name] ([id number]) -------------------------------------------------------------------------------- Multi-Disciplinary Care Plan Details Patient Name: Chad [name] Date of Service: 12/07/2020 9:00 AM Medical Record Number: [id number] Patient Account Number: [id number] Date of Birth/Sex: October 27, 1960 (60 y.o. M) Treating RN: Cornell Barman Primary Care : Clayborn Bigness Other Clinician: Referring : Clayborn Bigness Treating /Extender: Skipper Cliche in Treatment: 16 Active Inactive Wound/Skin Impairment Nursing Diagnoses: Knowledge deficit related to  ulceration/compromised skin integrity Goals: Patient/caregiver will verbalize understanding of skin care regimen Date Initiated: 08/14/2020 Date Inactivated: 10/09/2020 Target Resolution Date: 09/14/2020 Goal Status: Met Ulcer/skin breakdown will have a volume reduction of 30% by week 4 Date Initiated: 08/14/2020 Date Inactivated: 10/19/2020 Target Resolution Date: 09/14/2020 Goal Status: Met Ulcer/skin breakdown will heal within 14 weeks Date Initiated: 12/07/2020 Target Resolution Date: 01/23/2021 Goal Status: Active Interventions: Assess patient/caregiver ability to obtain necessary supplies Assess patient/caregiver ability to perform ulcer/skin care regimen upon admission and as needed Assess ulceration(s) every visit Notes: Electronic Signature(s) Signed: 12/07/2020 5:04:08 PM By: Gretta Cool, BSN, RN, CWS, Kim RN, BSN Entered By: Gretta Cool, BSN, RN, CWS, Kim on 12/07/2020 09:08:36 Chad [name] ([id number]) -------------------------------------------------------------------------------- Pain Assessment Details Patient Name: Chad [name] Date of Service: 12/07/2020 9:00 AM Medical Record Number: [id number] Patient Account Number: [id number] Date of Birth/Sex: 11/13/1960 (60 y.o. M) Treating RN: Dolan Amen Primary Care : Clayborn Bigness Other Clinician: Referring : Clayborn Bigness Treating /Extender: Skipper Cliche in Treatment: 16 Active Problems Location of Pain Severity and Description of Pain Patient Has Paino No Site Locations Rate the pain. Current Pain Level: 0 Pain Management and Medication Current Pain Management: Electronic Signature(s) Signed: 12/07/2020 11:20:32 AM By: Jeanine Luz Signed: 12/07/2020 11:53:36 AM By: Georges Mouse, Minus Breeding RN Entered By: Jeanine Luz on 12/07/2020 08:49:13 Chad [name] ([id number]) -------------------------------------------------------------------------------- Patient/Caregiver Education Details Patient  Name: Chad [name] Date of Service: 12/07/2020 9:00 AM Medical Record Number: [id number] Patient Account Number: [id number] Date of Birth/Gender: 1961/06/02 (60 y.o. M) Treating RN: Cornell Barman Primary Care Physician: Clayborn Bigness Other Clinician: Referring Physician: Clayborn Bigness Treating Physician/Extender: Skipper Cliche in Treatment: 16 Education Assessment Education Provided To: Patient Education Topics Provided Wound/Skin Impairment: Handouts: Caring for Your Ulcer, Other: continue wound care as prescribed. Methods: Demonstration, Explain/Verbal Responses: State content correctly Electronic Signature(s) Signed: 12/07/2020 5:04:08 PM By: Gretta Cool, BSN, RN, CWS, Kim RN, BSN Entered By: Gretta Cool, BSN, RN, CWS, Kim on 12/07/2020 09:12:16 Chad [name] ([id number]) -------------------------------------------------------------------------------- Wound Assessment Details Patient Name: Chad [name] Date of Service: 12/07/2020 9:00 AM Medical Record Number: [id number] Patient Account Number: [id number] Date of Birth/Sex: [birth year] (60 y.o. M) Treating RN: Cornell Barman Primary Care : Clayborn Bigness Other Clinician: Referring : Clayborn Bigness Treating /Extender: Jeri Cos Weeks in Treatment: 16 Wound Status Wound Number: 6 Primary Abscess Etiology: Wound Location: Right Toe Second Wound Healed - Epithelialized Wounding Event: Gradually Appeared Status: Date Acquired: 03/28/2020 Comorbid Anemia, Chronic Obstructive Pulmonary Disease (COPD), Weeks Of Treatment: 16 History: Hepatitis C, Received Chemotherapy Clustered Wound: No  Photos Wound Measurements Length: (cm) 0 Width: (cm) 0 Depth: (cm) 0 Area: (cm) 0 Volume: (cm) 0 % Reduction in Area: 100% % Reduction in Volume: 100% Epithelialization: Large (67-100%) Tunneling: No Undermining: No Wound Description Classification: Full Thickness Without Exposed Support Structure Exudate Amount: None Present s  Foul Odor After Cleansing: No Slough/Fibrino No Wound Bed Granulation Amount: None Present (0%) Exposed Structure Necrotic Amount: Large (67-100%) Fascia Exposed: No Necrotic Quality: Eschar Fat Layer (Subcutaneous Tissue) Exposed: No Tendon Exposed: No Muscle Exposed: No Joint Exposed: No Bone Exposed: No Treatment Notes Wound #6 (Toe Second) Wound Laterality: Right Cleanser Peri-Wound Care Topical Primary Dressing CHRSTOPHER, MALENFANT ([id number]) Secondary Dressing Secured With Compression Wrap Compression Stockings Add-Ons Electronic Signature(s) Signed: 12/07/2020 5:04:08 PM By: Gretta Cool, BSN, RN, CWS, Kim RN, BSN Entered By: Gretta Cool, BSN, RN, CWS, Kim on 12/07/2020 09:07:31 Chad [name] ([id number]) -------------------------------------------------------------------------------- Wound Assessment Details Patient Name: Chad [name] Date of Service: 12/07/2020 9:00 AM Medical Record Number: [id number] Patient Account Number: [id number] Date of Birth/Sex: Jan 14, 1961 (60 y.o. M) Treating RN: Dolan Amen Primary Care : Clayborn Bigness Other Clinician: Referring : Clayborn Bigness Treating /Extender: Jeri Cos Weeks in Treatment: 16 Wound Status Wound Number: 8 Primary Venous Leg Ulcer Etiology: Wound Location: Left, Medial Ankle Wound Open Wounding Event: Gradually Appeared Status: Date Acquired: 07/12/2019 Comorbid Anemia, Chronic Obstructive Pulmonary Disease (COPD), Weeks Of Treatment: 16 History: Hepatitis C, Received Chemotherapy Clustered Wound: No Photos Wound Measurements Length: (cm) 3.2 Width: (cm) 2 Depth: (cm) 0.1 Area: (cm) 5.027 Volume: (cm) 0.503 % Reduction in Area: 14.7% % Reduction in Volume: 14.6% Epithelialization: Small (1-33%) Tunneling: No Undermining: No Wound Description Classification: Full Thickness Without Exposed Support Structures Wound Margin: Flat and Intact Exudate Amount: Medium Exudate Type:  Serous Exudate Color: amber Foul Odor After Cleansing: No Slough/Fibrino Yes Wound Bed Granulation Amount: Medium (34-66%) Exposed Structure Granulation Quality: Red, Pink Fascia Exposed: No Necrotic Amount: Small (1-33%) Fat Layer (Subcutaneous Tissue) Exposed: Yes Necrotic Quality: Adherent Slough Tendon Exposed: No Muscle Exposed: No Joint Exposed: No Bone Exposed: No Treatment Notes Wound #8 (Ankle) Wound Laterality: Left, Medial Cleanser Normal Saline Discharge Instruction: Wash your hands with soap and water. Remove old dressing, discard into plastic bag and place into trash. Cleanse the wound with Normal Saline prior to applying a clean dressing using gauze sponges, not tissues or cotton balls. Do not scrub or use excessive force. Pat dry using gauze sponges, not tissue or cotton balls. ELIASAR, HLAVATY ([id number]) Peri-Wound Care Desitin Maximum Strength Ointment 4 (oz) Discharge Instruction: Zinc periwound Topical Primary Dressing Prisma 4.34 (in) Discharge Instruction: Moisten w/normal saline or sterile water; Cover wound as directed. Do not remove from wound bed. Secondary Dressing ABD Pad 5x9 (in/in) Discharge Instruction: Cover with ABD pad Kerlix 4.5 x 4.1 (in/yd) Discharge Instruction: Apply Kerlix 4.5 x 4.1 (in/yd) as instructed Secured With [place] Surgical Tape, 2x2 (in/yd) Compression Wrap Compression Stockings Add-Ons Electronic Signature(s) Signed: 12/07/2020 11:20:32 AM By: Jeanine Luz Signed: 12/07/2020 11:53:36 AM By: Georges Mouse, Minus Breeding RN Entered By: Jeanine Luz on 12/07/2020 08:57:35 Chad [name] ([id number]) -------------------------------------------------------------------------------- Vitals Details Patient Name: Chad [name] Date of Service: 12/07/2020 9:00 AM Medical Record Number: [id number] Patient Account Number: [id number] Date of Birth/Sex: 03-25-1961 (60 y.o. M) Treating RN: Dolan Amen Primary  Care : Clayborn Bigness Other Clinician: Referring : Clayborn Bigness Treating /Extender: Skipper Cliche in Treatment: 16 Vital Signs Time Taken: 08:45 Temperature (F): 98.1 Height (in): 67 Pulse (bpm): 78 Weight (lbs): 160 Respiratory  Rate (breaths/min): 18 Body Mass Index (BMI): 25.1 Blood Pressure (mmHg): 114/75 Reference Range: 80 - 120 mg / dl Electronic Signature(s) Signed: 12/07/2020 11:20:32 AM By: Jeanine Luz Entered By: Jeanine Luz on 12/07/2020 08:48:49

## 2020-12-14 ENCOUNTER — Other Ambulatory Visit: Payer: Self-pay

## 2020-12-14 ENCOUNTER — Encounter: Payer: Medicaid Other | Admitting: Physician Assistant

## 2020-12-14 DIAGNOSIS — L97512 Non-pressure chronic ulcer of other part of right foot with fat layer exposed: Secondary | ICD-10-CM | POA: Diagnosis not present

## 2020-12-14 NOTE — Progress Notes (Addendum)
ROALD, WOLLAM ([id number]) Visit Report for 12/14/2020 Chief Complaint Document Details Patient Name: Chad [name], Chad [name] Date of Service: 12/14/2020 11:15 AM Medical Record Number: [id number] Patient Account Number: [id number] Date of Birth/Sex: 1960/10/22 (60 y.o. M) Treating RN: Carlene Coria Primary Care Provider: Clayborn Bigness Other Clinician: Referring Provider: Clayborn Bigness Treating Provider/Extender: Skipper Cliche in Treatment: 17 Information Obtained from: Patient Chief Complaint Left ankle and right foot and heel ulcers Electronic Signature(s) Signed: 12/14/2020 11:06:09 AM By: Worthy Keeler PA-C Entered By: Worthy Keeler on 12/14/2020 11:06:09 Chad [name] ([id number]) -------------------------------------------------------------------------------- Debridement Details Patient Name: Chad [name] Date of Service: 12/14/2020 11:15 AM Medical Record Number: [id number] Patient Account Number: [id number] Date of Birth/Sex: July 03, 1961 (60 y.o. M) Treating RN: Carlene Coria Primary Care Provider: Clayborn Bigness Other Clinician: Referring Provider: Clayborn Bigness Treating Provider/Extender: Skipper Cliche in Treatment: 17 Debridement Performed for Wound #8 Left,Medial Ankle Assessment: Performed By: Physician Tommie Sams., PA-C Debridement Type: Debridement Severity of Tissue Pre Debridement: Fat layer exposed Level of Consciousness (Pre- Awake and Alert procedure): Pre-procedure Verification/Time Out Yes - 11:30 Taken: Start Time: 11:30 Pain Control: Lidocaine 4% Topical Solution Total Area Debrided (L x W): 3.2 (cm) x 2 (cm) = 6.4 (cm) Tissue and other material Viable, Non-Viable, Slough, Subcutaneous, Skin: Dermis , Skin: Epidermis, Slough debrided: Level: Skin/Subcutaneous Tissue Debridement Description: Excisional Instrument: Curette Bleeding: Minimum Hemostasis Achieved: Pressure End Time: 11:32 Procedural Pain: 0 Post Procedural Pain: 0 Response to  Treatment: Procedure was tolerated well Level of Consciousness (Post- Awake and Alert procedure): Post Debridement Measurements of Total Wound Length: (cm) 3.2 Width: (cm) 2 Depth: (cm) 0.2 Volume: (cm) 1.005 Character of Wound/Ulcer Post Debridement: Improved Severity of Tissue Post Debridement: Fat layer exposed Post Procedure Diagnosis Same as Pre-procedure Electronic Signature(s) Signed: 12/14/2020 4:32:16 PM By: Worthy Keeler PA-C Signed: 12/17/2020 2:18:56 PM By: Carlene Coria RN Entered By: Carlene Coria on 12/14/2020 11:32:15 Chad [name] ([id number]) -------------------------------------------------------------------------------- HPI Details Patient Name: Chad [name] Date of Service: 12/14/2020 11:15 AM Medical Record Number: [id number] Patient Account Number: [id number] Date of Birth/Sex: [birth year] (60 y.o. M) Treating RN: Carlene Coria Primary Care Provider: Clayborn Bigness Other Clinician: Referring Provider: Clayborn Bigness Treating Provider/Extender: Skipper Cliche in Treatment: 17 History of Present Illness HPI Description: 10/08/18 on evaluation today patient actually presents to our office for initial evaluation concerning wounds that he has of the bilateral lower extremities. 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 He also has bilateral venous stasis. He apparently has been homeless currently following discharge from the hospital apparently he has been placed at [facility] which is is a skilled nursing facility locally. Nonetheless fortunately he does not show any signs of infection at this time which is good news. In fact several of the wound actually appears to be showing some signs of improvement already in my pinion. There are a couple areas in the left leg in particular there likely gonna require  some sharp debridement to help clear away some necrotic tissue and help with more sufficient healing. No fevers, chills, nausea, or vomiting noted at this time. 10/15/18 on evaluation today patient actually appears to be doing very well in regard to his bilateral lower extremities. He's been tolerating the dressing changes without complication. Fortunately there does not appear to be any evidence of active infection at this time which is great news.  Overall I'm actually very pleased with how this has progressed in just one visits time. Readmission: 08/14/2020 upon evaluation today patient presents for re-evaluation here in our clinic. He is having issues with his left ankle region as well as his right toe and his right heel. He tells me that the toe and heel actually began as a area that was itching that he was scratching and then subsequently opened up into wounds. These may have been abscess areas I presume based on what I am seeing currently. With regard to his left ankle region he tells me this was a similar type occurrence although he does have venous stasis this very well may be more of a venous leg ulcer more than anything. Nonetheless I do believe that the patient would benefit from appropriate and aggressive wound care to try to help get things under better control here. He does have history of a stroke on the left side affecting him to some degree there that he is able to stand although he does have some residual weakness. Otherwise again the patient does have chronic venous insufficiency as previously noted. His arterial studies most recently obtained showed that he had an ABI on the right of 1.16 with a TBI of 0.52 and on the left and ABI of 1.14 with a TBI of 0.81. That was obtained on 06/19/2020. 08/28/2020 upon evaluation today patient appears to be doing decently well in regard to his wounds in general. He has been tolerating the dressing changes without complication. Fortunately there  does not appear to be any signs of active infection which is great news. With that being said I think the [facility] is doing a good job I would recommend that we likely continue with that currently. 09/11/2020 upon evaluation today patient's wounds did not appear to be doing too poorly but again he is not really showing signs of significant improvement with regard to any of the wounds on the right. None of them have Hydrofera Blue on them I am not exactly sure why this is not being followed as the facility did not contact us to let us know of any issues with obtaining dressings or otherwise. With that being said he is supposed to be using Hydrofera Blue on both of the wounds on the right foot as well as the ankle wound on the left side. 09/18/2020 upon evaluation today patient appears to be doing poorly with regard to his wounds. Again right now the left ankle in particular showed signs of extreme maceration. Apparently he was told by someone with staff at [facility] they could not get the [facility]. With that being said this is something that is never been relayed to [place] one way or another. Also the patient subsequently has not supposed to have a border gauze dressing on. He should have an ABD pad and roll gauze to secure as this drains much too much just to have a border gauze dressing to cover. Nonetheless the fact that they are not using the appropriate dressing is directly causing deterioration of the left ankle wound it is significantly worse today compared to what it was previous. I did attempt to call [facility] while the patient was here I called three times and got no one to even pick up the phone. After this I had my for an office coordinator call and she was able to finally get through and leave a message with the D ON as of dictation of this note which is roughly  about an hour and a half later I still have not been able to speak with anyone at the  facility. 09/25/2020 upon evaluation today patient actually showing signs of good improvement which is excellent news. He has been tolerating the dressing changes without complication. Fortunately there is no signs of active infection which is great news. No fevers, chills, nausea, vomiting, or diarrhea. I do feel like the facility has been doing a much better job at taking care of him as far as the dressings are concerned. However the director of nursing never did call me back. 10/09/2020 upon evaluation today patient appears to be doing well with regard to his wound. The toe ulcer did require some debridement but the other 2 areas actually appear to be doing quite well. 10/19/2020 upon evaluation today patient actually appears to be doing very well in regard to his wounds. In fact the heel does appear to be completely healed. The toe is doing better in the medial ankle on the left is also doing better. Overall I think he is headed in the right direction. 10/26/2020 upon evaluation today patient appears to be doing well with regard to his wound. He is showing signs of improvement which is great news and overall I am very pleased with where things stand today. No fevers, chills, nausea, vomiting, or diarrhea. 11/02/2020 upon evaluation today patient appears to be doing well with regard to his wounds. He has been tolerating the dressing changes without complication overall I am extremely pleased with where things stand today. He in regard to the toe is almost completely healed and the medial ankle on the left is doing much better. 11/09/2020 upon evaluation today patient appears to be doing a little poorly in regard to his left medial ankle ulcer. Fortunately there does not appear to be any signs of systemic infection but unfortunately locally he does appear to be infected in fact he has blue-green drainage consistent with Pseudomonas. Chad [name], Chad [name] ([id number]) 11/16/2020 upon evaluation today patient  appears to be doing well with regard to his wound. It actually appears to be doing better. I did place him on gentamicin cream since the Cipro was actually resistant even though he was positive for Pseudomonas on culture. Overall I think that he does seem to be doing better though I am unsure whether or not they have actually been putting the cream on. The patient is not sure that we did talk to the nurse directly and she was going to initiate that treatment. Fortunately there does not appear to be any signs of active infection at this time. No fevers, chills, nausea, vomiting, or diarrhea. 4/28; the area on the right second toe is close to healed. Left medial ankle required debridement 12/07/2020 upon evaluation today patient appears to be doing well with regard to his wounds. In fact the right second toe appears to be completely healed which is great news. Fortunately there does not appear to be any signs of active infection at this time which is also great news. I think we can probably discontinue the gentamicin on top of everything else. 12/14/2020 upon evaluation today patient appears to be doing well with regard to his wound. He is making good progress and overall very pleased with where things stand today. There is no signs of active infection at this time which is great news. Electronic Signature(s) Signed: 12/14/2020 12:42:27 PM By: Worthy Keeler PA-C Entered By: Worthy Keeler on 12/14/2020 12:42:27 Chad [name] ([id number]) -------------------------------------------------------------------------------- Physical Exam Details  Patient Name: Chad [name], Chad [name] Date of Service: 12/14/2020 11:15 AM Medical Record Number: [id number] Patient Account Number: [id number] Date of Birth/Sex: 08-17-1960 (60 y.o. M) Treating RN: Carlene Coria Primary Care Provider: Clayborn Bigness Other Clinician: Referring Provider: Clayborn Bigness Treating Provider/Extender: Skipper Cliche in Treatment:  24 Constitutional Well-nourished and well-hydrated in no acute distress. Respiratory normal breathing without difficulty. Psychiatric this patient is able to make decisions and demonstrates good insight into disease process. Alert and Oriented x 3. pleasant and cooperative. Notes Upon inspection patient's wound bed showed signs of good granulation epithelization at this point. There does not appear to be any evidence of infection which is great and overall I am extremely pleased with where things stand today. The patient likewise is happy compared to where he has come from. Electronic Signature(s) Signed: 12/14/2020 12:42:40 PM By: Worthy Keeler PA-C Entered By: Worthy Keeler on 12/14/2020 12:42:40 Chad [name] ([id number]) -------------------------------------------------------------------------------- Physician Orders Details Patient Name: Chad [name] Date of Service: 12/14/2020 11:15 AM Medical Record Number: [id number] Patient Account Number: [id number] Date of Birth/Sex: [birth year] (60 y.o. 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 Wound Treatment Wound #8 - Ankle Wound Laterality: Left, Medial Cleanser: Normal Saline 1 x Per Day/30 Days Discharge Instructions: Wash your hands with soap and water. Remove old dressing,  discard into plastic bag and place into trash. Cleanse the wound with Normal Saline prior to applying a clean dressing using gauze sponges, not tissues or cotton balls. Do not scrub or use excessive force. Pat dry using gauze sponges, not tissue or cotton balls. Peri-Wound Care: Desitin Maximum Strength Ointment 4 (oz) 1 x Per Day/30 Days Discharge Instructions: Zinc periwound Primary Dressing: Prisma 4.34 (in) 1 x Per Day/30 Days Discharge Instructions: Moisten w/normal saline or sterile water; Cover wound as directed. Do not remove from wound bed. Secondary Dressing: ABD Pad 5x9 (in/in) 1 x Per Day/30 Days Discharge Instructions: Cover with ABD pad Secondary Dressing: Kerlix 4.5 x 4.1 (in/yd) 1 x Per Day/30 Days Discharge Instructions: Apply Kerlix 4.5 x 4.1 (in/yd) as instructed Secured With: 25M Medipore H Soft Cloth Surgical Tape, 2x2 (in/yd) 1 x Per Day/30 Days Electronic Signature(s) Signed: 12/14/2020 4:32:16 PM By: Worthy Keeler PA-C Signed: 12/17/2020 2:18:56 PM By: Carlene Coria RN Entered By: Carlene Coria on 12/14/2020 11:32:36 Chad [name] ([id number]) -------------------------------------------------------------------------------- Problem List Details Patient Name: Chad [name] Date of Service: 12/14/2020 11:15 AM Medical Record Number: [id number] Patient Account Number: [id number] Date of Birth/Sex: 1960/10/11 (60 y.o. M) Treating RN: Carlene Coria Primary Care Provider: Clayborn Bigness Other Clinician: Referring Provider: Clayborn Bigness Treating Provider/Extender: Skipper Cliche in Treatment: 17 Active Problems ICD-10 Encounter Code Description Active Date MDM Diagnosis I87.2 Venous insufficiency (chronic) (peripheral) 08/14/2020 No Yes L97.322 Non-pressure chronic ulcer of left ankle with fat layer exposed 08/14/2020 No Yes L97.412 Non-pressure chronic ulcer of right heel and midfoot with fat layer 08/14/2020 No Yes exposed L97.512 Non-pressure chronic ulcer of other  part of right foot with  fat layer 08/14/2020 No Yes exposed 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 M) Treating RN: Carlene Coria Primary Care Provider: Clayborn Bigness Other Clinician: Referring Provider: Clayborn Bigness Treating Provider/Extender: Skipper Cliche in Treatment: 17 Subjective Chief Complaint Information obtained from Patient Left ankle and right foot and heel ulcers History of Present Illness (HPI) 10/08/18 on evaluation today patient actually presents to our office for initial evaluation concerning wounds that he has of the bilateral lower extremities. 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 He also has bilateral venous stasis. He apparently has been homeless currently following discharge from the hospital apparently he has been placed at [facility] which is is a skilled nursing facility locally. Nonetheless fortunately he does not show any signs of infection at this time which is good news. In fact several of the wound actually appears to be showing some signs of improvement already in my pinion. There are a couple areas in the left leg in particular there likely gonna require some sharp debridement to help clear away some necrotic tissue and help with more sufficient  healing. No fevers, chills, nausea, or vomiting noted at this time. 10/15/18 on evaluation today patient actually appears to be doing very well in regard to his bilateral lower extremities. He's been tolerating the dressing changes without complication. Fortunately there does not appear to be any evidence of active infection at this time which is great news. Overall I'm actually very pleased with how this has progressed in just one visits time. Readmission: 08/14/2020 upon evaluation today patient presents for re-evaluation here in our clinic. He is having issues with his left ankle region as well as his right toe and his right heel. He tells me that the toe and heel actually began as a area that was itching that he was scratching and then subsequently opened up into wounds. These may have been abscess areas I presume based on what I am seeing currently. With regard to his left ankle region he tells me this was a similar type occurrence although he does have venous stasis this very well may be more of a venous leg ulcer more than anything. Nonetheless I do believe that the patient would benefit from appropriate and aggressive wound care to try to help get things under better control here. He does have history of a stroke on the left side affecting him to some degree there that he is able to stand although he does have some residual weakness. Otherwise again the patient does have chronic venous insufficiency as previously noted. His arterial studies most recently obtained showed that he had an ABI on the right of 1.16 with a TBI of 0.52 and on the left and ABI of 1.14 with a TBI of 0.81. That was obtained on 06/19/2020. 08/28/2020 upon evaluation today patient appears to be doing decently well in regard to his wounds in general. He has been tolerating the dressing changes without complication. Fortunately there does not appear to be any signs of active  infection which is great news. With that being said  I think the [facility] is doing a good job I would recommend that we likely continue with that currently. 09/11/2020 upon evaluation today patient's wounds did not appear to be doing too poorly but again he is not really showing signs of significant improvement with regard to any of the wounds on the right. None of them have Hydrofera Blue on them I am not exactly sure why this is not being followed as the facility did not contact us to let us know of any issues with obtaining dressings or otherwise. With that being said he is supposed to be using Hydrofera Blue on both of the wounds on the right foot as well as the ankle wound on the left side. 09/18/2020 upon evaluation today patient appears to be doing poorly with regard to his wounds. Again right now the left ankle in particular showed signs of extreme maceration. Apparently he was told by someone with staff at [facility] they could not get the [facility]. With that being said this is something that is never been relayed to [place] one way or another. Also the patient subsequently has not supposed to have a border gauze dressing on. He should have an ABD pad and roll gauze to secure as this drains much too much just to have a border gauze dressing to cover. Nonetheless the fact that they are not using the appropriate dressing is directly causing deterioration of the left ankle wound it is significantly worse today compared to what it was previous. I did attempt to call [facility] while the patient was here I called three times and got no one to even pick up the phone. After this I had my for an office coordinator call and she was able to finally get through and leave a message with the D ON as of dictation of this note which is roughly about an hour and a half later I still have not been able to speak with anyone at the facility. 09/25/2020 upon evaluation today patient actually showing signs of good improvement which is excellent  news. He has been tolerating the dressing changes without complication. Fortunately there is no signs of active infection which is great news. No fevers, chills, nausea, vomiting, or diarrhea. I do feel like the facility has been doing a much better job at taking care of him as far as the dressings are concerned. However the director of nursing never did call me back. 10/09/2020 upon evaluation today patient appears to be doing well with regard to his wound. The toe ulcer did require some debridement but the other 2 areas actually appear to be doing quite well. 10/19/2020 upon evaluation today patient actually appears to be doing very well in regard to his wounds. In fact the heel does appear to be completely healed. The toe is doing better in the medial ankle on the left is also doing better. Overall I think he is headed in the right direction. 10/26/2020 upon evaluation today patient appears to be doing well with regard to his wound. He is showing signs of improvement which is great news and overall I am very pleased with where things stand today. No fevers, chills, nausea, vomiting, or diarrhea. 11/02/2020 upon evaluation today patient appears to be doing well with regard to his wounds. He has been tolerating the dressing changes without complication overall I am extremely pleased with where things stand today. He in regard to the toe is  almost completely healed and the medial [facility], Chad [name] ([id number]) ankle on the left is doing much better. 11/09/2020 upon evaluation today patient appears to be doing a little poorly in regard to his left medial ankle ulcer. Fortunately there does not appear to be any signs of systemic infection but unfortunately locally he does appear to be infected in fact he has blue-green drainage consistent with Pseudomonas. 11/16/2020 upon evaluation today patient appears to be doing well with regard to his wound. It actually appears to be doing better. I did place him on  gentamicin cream since the Cipro was actually resistant even though he was positive for Pseudomonas on culture. Overall I think that he does seem to be doing better though I am unsure whether or not they have actually been putting the cream on. The patient is not sure that we did talk to the nurse directly and she was going to initiate that treatment. Fortunately there does not appear to be any signs of active infection at this time. No fevers, chills, nausea, vomiting, or diarrhea. 4/28; the area on the right second toe is close to healed. Left medial ankle required debridement 12/07/2020 upon evaluation today patient appears to be doing well with regard to his wounds. In fact the right second toe appears to be completely healed which is great news. Fortunately there does not appear to be any signs of active infection at this time which is also great news. I think we can probably discontinue the gentamicin on top of everything else. 12/14/2020 upon evaluation today patient appears to be doing well with regard to his wound. He is making good progress and overall very pleased with where things stand today. There is no signs of active infection at this time which is great news. Objective Constitutional Well-nourished and well-hydrated in no acute distress. Vitals Time Taken: 10:48 AM, Height: 67 in, Weight: 160 lbs, BMI: 25.1, Temperature: 98.2 F, Pulse: 81 bpm, Respiratory Rate: 16 breaths/min, Blood Pressure: 127/76 mmHg. Respiratory normal breathing without difficulty. Psychiatric this patient is able to make decisions and demonstrates good insight into disease process. Alert and Oriented x 3. pleasant and cooperative. General Notes: Upon inspection patient's wound bed showed signs of good granulation epithelization at this point. There does not appear to be any evidence of infection which is great and overall I am extremely pleased with where things stand today. The patient likewise is  happy compared to where he has come from. Integumentary (Hair, Skin) Wound #8 status is Open. Original cause of wound was Gradually Appeared. The date acquired was: 07/12/2019. The wound has been in treatment 17 weeks. The wound is located on the Left,Medial Ankle. The wound measures 3.2cm length x 2cm width x 0.2cm depth; 5.027cm^2 area and 1.005cm^3 volume. There is Fat Layer (Subcutaneous Tissue) exposed. There is a medium amount of serous drainage noted. The wound margin is flat and intact. There is small (1-33%) pink granulation within the wound bed. There is a large (67-100%) amount of necrotic tissue within the wound bed including Adherent Slough. 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 There was a Excisional Skin/Subcutaneous Tissue Debridement with a total area of 6.4 sq cm performed by Tommie Sams., PA-C. With the following instrument(s): Curette to remove Viable and Non-Viable tissue/material. Material removed includes Subcutaneous Tissue, Slough, Skin: Dermis, and Skin: Epidermis after achieving pain control using Lidocaine 4% Topical Solution. No specimens were taken. A time out was conducted at 11:30, prior to the start of the procedure. A Minimum amount of bleeding was controlled with Pressure. The procedure was tolerated well with a pain level of 0 throughout and a pain level of 0 following the procedure. Post Debridement Measurements:  3.2cm length x 2cm width x 0.2cm depth; 1.005cm^3 volume. Character of Wound/Ulcer Post Debridement is improved. Severity of Tissue Post Debridement is: Fat layer exposed. Post procedure Diagnosis Wound #8: Same as Pre-Procedure Plan Follow-up Appointments: Return Appointment in 2 weeks. WOUND #8: - Ankle Wound Laterality: Left, Medial Cleanser: Normal Saline 1 x Per Day/30 Days Discharge Instructions: Wash your hands with soap and water. Remove old dressing, discard into plastic bag and place into trash. Cleanse the wound with Normal Saline prior to applying a clean dressing using gauze sponges, not tissues or cotton balls. Do not scrub or use excessive force. Pat dry using gauze sponges, not tissue or cotton balls. Peri-Wound Care: Desitin Maximum Strength Ointment 4 (oz) 1 x Per Day/30 Days Discharge Instructions: Zinc periwound Primary Dressing: Prisma 4.34 (in) 1 x Per Day/30 Days Discharge Instructions: Moisten w/normal saline or sterile water; Cover wound as directed. Do not remove from wound bed. Secondary Dressing: ABD Pad 5x9 (in/in) 1 x Per Day/30 Days Discharge Instructions: Cover with ABD pad Secondary Dressing: Kerlix 4.5 x 4.1 (in/yd) 1 x Per Day/30 Days Discharge Instructions: Apply Kerlix 4.5 x 4.1 (in/yd) as instructed Secured With: 49M Medipore H Soft Cloth Surgical Tape, 2x2 (in/yd) 1 x Per Day/30 Days 1. Would recommend currently that we actually going continue with the wound care measures as before. I do feel like the collagen has been beneficial. I think that he is doing excellent at this time. 2. I am also can recommend that we have the patient continue with the ABD pad to cover followed by roll gauze to secure in place. 3. I am also going to suggest that we have the patient continue to elevate his legs much as possible to try to keep edema under good control. We will see patient back for reevaluation in 1 week here in the clinic. If anything worsens or changes  patient will contact our office for additional recommendations. Electronic Signature(s) Signed: 12/14/2020 12:43:04 PM By: Worthy Keeler PA-C Entered By: Worthy Keeler on 12/14/2020 12:43:04 Chad [name] ([id number]) -------------------------------------------------------------------------------- SuperBill Details Patient Name: Chad [name] Date of Service: 12/14/2020 Medical Record Number: [id number] Patient Account Number: [id number] Date of Birth/Sex: 10/09/1960 (60 y.o. M) Treating RN: Carlene Coria Primary Care Provider: Clayborn Bigness Other Clinician: Referring Provider: Clayborn Bigness Treating Provider/Extender: Skipper Cliche in Treatment: 17 Diagnosis Coding ICD-10 Codes Code Description I87.2 Venous insufficiency (chronic) (peripheral) L97.322 Non-pressure chronic ulcer of left ankle with fat layer exposed L97.412 Non-pressure chronic ulcer of right heel and midfoot with fat layer exposed L97.512 Non-pressure chronic ulcer of other part of right foot with fat layer exposed 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 side Facility Procedures CPT4 Code: [id number] Description: 11042 - DEB SUBQ TISSUE 20 SQ CM/< Modifier: Quantity: 1 CPT4 Code: Description: ICD-10 Diagnosis Description L97.322 Non-pressure chronic ulcer of  left ankle with fat layer exposed Modifier: Quantity: Physician Procedures CPT4 Code: [id number] Description: [id number] - WC PHYS SUBQ TISS 20 SQ CM Modifier: Quantity: 1 CPT4 Code: Description: ICD-10 Diagnosis Description [id number] Non-pressure chronic ulcer of left ankle with fat layer exposed Modifier: Quantity: Electronic Signature(s) Signed: 12/14/2020 12:43:14 PM By: Worthy Keeler PA-C Entered By: Worthy Keeler on 12/14/2020 12:43:14

## 2020-12-17 ENCOUNTER — Inpatient Hospital Stay: Payer: Medicaid Other | Attending: Internal Medicine

## 2020-12-17 ENCOUNTER — Inpatient Hospital Stay (HOSPITAL_BASED_OUTPATIENT_CLINIC_OR_DEPARTMENT_OTHER): Payer: Medicaid Other | Admitting: Internal Medicine

## 2020-12-17 ENCOUNTER — Inpatient Hospital Stay: Payer: Medicaid Other

## 2020-12-17 ENCOUNTER — Other Ambulatory Visit: Payer: Self-pay

## 2020-12-17 ENCOUNTER — Encounter: Payer: Self-pay | Admitting: Internal Medicine

## 2020-12-17 DIAGNOSIS — D374 Neoplasm of uncertain behavior of colon: Secondary | ICD-10-CM | POA: Diagnosis not present

## 2020-12-17 DIAGNOSIS — F17210 Nicotine dependence, cigarettes, uncomplicated: Secondary | ICD-10-CM | POA: Insufficient documentation

## 2020-12-17 DIAGNOSIS — D509 Iron deficiency anemia, unspecified: Secondary | ICD-10-CM | POA: Diagnosis not present

## 2020-12-17 DIAGNOSIS — Z8546 Personal history of malignant neoplasm of prostate: Secondary | ICD-10-CM | POA: Insufficient documentation

## 2020-12-17 DIAGNOSIS — R5383 Other fatigue: Secondary | ICD-10-CM | POA: Diagnosis not present

## 2020-12-17 DIAGNOSIS — K219 Gastro-esophageal reflux disease without esophagitis: Secondary | ICD-10-CM | POA: Insufficient documentation

## 2020-12-17 DIAGNOSIS — C182 Malignant neoplasm of ascending colon: Secondary | ICD-10-CM

## 2020-12-17 DIAGNOSIS — Z5111 Encounter for antineoplastic chemotherapy: Secondary | ICD-10-CM | POA: Diagnosis not present

## 2020-12-17 DIAGNOSIS — C661 Malignant neoplasm of right ureter: Secondary | ICD-10-CM

## 2020-12-17 DIAGNOSIS — I10 Essential (primary) hypertension: Secondary | ICD-10-CM | POA: Diagnosis not present

## 2020-12-17 DIAGNOSIS — J449 Chronic obstructive pulmonary disease, unspecified: Secondary | ICD-10-CM | POA: Insufficient documentation

## 2020-12-17 DIAGNOSIS — Z1509 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: Secondary | ICD-10-CM

## 2020-12-17 DIAGNOSIS — B182 Chronic viral hepatitis C: Secondary | ICD-10-CM | POA: Insufficient documentation

## 2020-12-17 DIAGNOSIS — Z8559 Personal history of malignant neoplasm of other urinary tract organ: Secondary | ICD-10-CM | POA: Insufficient documentation

## 2020-12-17 DIAGNOSIS — Z79899 Other long term (current) drug therapy: Secondary | ICD-10-CM | POA: Insufficient documentation

## 2020-12-17 DIAGNOSIS — Z5112 Encounter for antineoplastic immunotherapy: Secondary | ICD-10-CM | POA: Diagnosis not present

## 2020-12-17 DIAGNOSIS — Z7982 Long term (current) use of aspirin: Secondary | ICD-10-CM | POA: Insufficient documentation

## 2020-12-17 DIAGNOSIS — R5381 Other malaise: Secondary | ICD-10-CM | POA: Insufficient documentation

## 2020-12-17 DIAGNOSIS — Z515 Encounter for palliative care: Secondary | ICD-10-CM

## 2020-12-17 DIAGNOSIS — Z7189 Other specified counseling: Secondary | ICD-10-CM

## 2020-12-17 DIAGNOSIS — I739 Peripheral vascular disease, unspecified: Secondary | ICD-10-CM | POA: Diagnosis not present

## 2020-12-17 LAB — CBC WITH DIFFERENTIAL/PLATELET
Abs Immature Granulocytes: 0.02 10*3/uL (ref 0.00–0.07)
Basophils Absolute: 0.1 10*3/uL (ref 0.0–0.1)
Basophils Relative: 1 %
Eosinophils Absolute: 0.3 10*3/uL (ref 0.0–0.5)
Eosinophils Relative: 4 %
HCT: 40.2 % (ref 39.0–52.0)
Hemoglobin: 12.7 g/dL — ABNORMAL LOW (ref 13.0–17.0)
Immature Granulocytes: 0 %
Lymphocytes Relative: 17 %
Lymphs Abs: 1.4 10*3/uL (ref 0.7–4.0)
MCH: 24.8 pg — ABNORMAL LOW (ref 26.0–34.0)
MCHC: 31.6 g/dL (ref 30.0–36.0)
MCV: 78.4 fL — ABNORMAL LOW (ref 80.0–100.0)
Monocytes Absolute: 0.5 10*3/uL (ref 0.1–1.0)
Monocytes Relative: 7 %
Neutro Abs: 5.7 10*3/uL (ref 1.7–7.7)
Neutrophils Relative %: 71 %
Platelets: 196 10*3/uL (ref 150–400)
RBC: 5.13 MIL/uL (ref 4.22–5.81)
RDW: 18.9 % — ABNORMAL HIGH (ref 11.5–15.5)
WBC: 8.1 10*3/uL (ref 4.0–10.5)
nRBC: 0 % (ref 0.0–0.2)

## 2020-12-17 LAB — COMPREHENSIVE METABOLIC PANEL
ALT: 18 U/L (ref 0–44)
AST: 30 U/L (ref 15–41)
Albumin: 3.8 g/dL (ref 3.5–5.0)
Alkaline Phosphatase: 100 U/L (ref 38–126)
Anion gap: 10 (ref 5–15)
BUN: 25 mg/dL — ABNORMAL HIGH (ref 8–23)
CO2: 24 mmol/L (ref 22–32)
Calcium: 8.6 mg/dL — ABNORMAL LOW (ref 8.9–10.3)
Chloride: 102 mmol/L (ref 98–111)
Creatinine, Ser: 0.96 mg/dL (ref 0.61–1.24)
GFR, Estimated: >60 mL/min (ref >60)
Glucose, Bld: 82 mg/dL (ref 70–99)
Potassium: 4 mmol/L (ref 3.5–5.1)
Sodium: 136 mmol/L (ref 135–145)
Total Bilirubin: 0.6 mg/dL (ref 0.3–1.2)
Total Protein: 7.8 g/dL (ref 6.5–8.1)

## 2020-12-17 MED ORDER — HEPARIN SOD (PORK) LOCK FLUSH 100 UNIT/ML IV SOLN
500.0000 [IU] | Freq: Once | INTRAVENOUS | Status: AC | PRN
  Administered 2020-12-17: 500 [IU]
  Filled 2020-12-17: qty 5

## 2020-12-17 MED ORDER — SODIUM CHLORIDE 0.9 % IV SOLN
Freq: Once | INTRAVENOUS | Status: AC
  Filled 2020-12-17: qty 250

## 2020-12-17 MED ORDER — SODIUM CHLORIDE 0.9 % IV SOLN
200.0000 mg | Freq: Once | INTRAVENOUS | Status: AC
  Administered 2020-12-17: 200 mg via INTRAVENOUS
  Filled 2020-12-17: qty 8

## 2020-12-17 NOTE — Assessment & Plan Note (Addendum)
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 JAN 5th, 2022- CT findings worrisome for progressive right renal pelvis and ureteral tumor and mass suspicious for colonic neoplasm involving the descending colon sigmoid colon junction region [see below].  Mild soft tissue thickening around the distal right ureter could reflect ureteral tumor; FEB 2022-  RIGHT renal pelvis and bladder are not evaluable by FDG PET imaging due to high activity of radiotracer within the urine. STABLE; but see below re: sigmoid mass/colon cancer  # proceed with  [place] today; Labs today reviewed; Labs today reviewed;  acceptable for treatment today.   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  Discussed with Dr. Bary Castilla given patient's stage IV ureteral malignancy/also given patient multiple medical problems including severe vascular disease-I think it is reasonable to continue observation with a repeat colonoscopy in 6 months.  Patient is high risk of surgical complications at this time.  # Iron deficiency anemia-hemoglobin 11.5; February 2022 ron studies/ferritin-LOW; plan Venofer with infusions;  STABLE.   # Bilateral LE ulcers-s/p wound care evaluation; s/p PTCA with Dr.Dew. bil Arterial Dopp-wnl; STABLE [Dr.]  #DISPOSITION: #  Keytruda; HOLD venofer today;  # follow up in 3 weeks-;MD labs-cbc/cmp;  Keytruda; - Dr.B.

## 2020-12-17 NOTE — Patient Instructions (Signed)
[facility]  Discharge Instructions: Thank you for choosing [facility] to provide your oncology and hematology care.  If you have a lab appointment with the Cancer Center, please go directly to the Cancer Center and check in at the registration area.  Wear comfortable clothing and clothing appropriate for easy access to any Portacath or PICC line.   We strive to give you quality time with your provider. You may need to reschedule your appointment if you arrive late (15 or more minutes).  Arriving late affects you and other patients whose appointments are after yours.  Also, if you miss three or more appointments without notifying the office, you may be dismissed from the clinic at the provider's discretion.      For prescription refill requests, have your pharmacy contact our office and allow 72 hours for refills to be completed.    Today you received the following chemotherapy and/or immunotherapy agents :Keytruda      To help prevent nausea and vomiting after your treatment, we encourage you to take your nausea medication as directed.  BELOW ARE SYMPTOMS THAT SHOULD BE REPORTED IMMEDIATELY: . *FEVER GREATER THAN 100.4 F (38 C) OR HIGHER . *CHILLS OR SWEATING . *NAUSEA AND VOMITING THAT IS NOT CONTROLLED WITH YOUR NAUSEA MEDICATION . *UNUSUAL SHORTNESS OF BREATH . *UNUSUAL BRUISING OR BLEEDING . *URINARY PROBLEMS (pain or burning when urinating, or frequent urination) . *BOWEL PROBLEMS (unusual diarrhea, constipation, pain near the anus) . TENDERNESS IN MOUTH AND THROAT WITH OR WITHOUT PRESENCE OF ULCERS (sore throat, sores in mouth, or a toothache) . UNUSUAL RASH, SWELLING OR PAIN  . UNUSUAL VAGINAL DISCHARGE OR ITCHING   Items with * indicate a potential emergency and should be followed up as soon as possible or go to the Emergency Department if any problems should occur.  Please show the CHEMOTHERAPY ALERT CARD or IMMUNOTHERAPY  ALERT CARD at check-in to the Emergency Department and triage nurse.  Should you have questions after your visit or need to cancel or reschedule your appointment, please contact [facility]  [phone] and follow the prompts.  Office hours are 8:00 a.m. to 4:30 p.m. Monday - Friday. Please note that voicemails left after 4:00 p.m. may not be returned until the following business day.  We are closed weekends and major holidays. You have access to a nurse at all times for urgent questions. Please call the main number to the clinic [phone] and follow the prompts.  For any non-urgent questions, you may also contact your provider using MyChart. We now offer e-Visits for anyone 18 and older to request care online for non-urgent symptoms. For details visit [email or web address].   Also download the MyChart app! Go to the app store, search "MyChart", open the app, select [facility], and log in with your MyChart username and password.  Due to Covid, a mask is required upon entering the hospital/clinic. If you do not have a mask, one will be given to you upon arrival. For doctor visits, patients may have 1 support person aged 18 or older with them. For treatment visits, patients cannot have anyone with them due to current Covid guidelines and our immunocompromised population.   Pembrolizumab injection What is this medicine? PEMBROLIZUMAB (pem broe liz ue mab) is a monoclonal antibody. It is used to treat certain types of cancer. This medicine may be used for other purposes; ask your health care provider or pharmacist if you have questions. COMMON   BRAND NAME(S): Keytruda What should I tell my health care provider before I take this medicine? They need to know if you have any of these conditions:  autoimmune diseases like Crohn's disease, ulcerative colitis, or lupus  have had or planning to have an allogeneic stem cell transplant (uses someone else's stem  cells)  history of organ transplant  history of chest radiation  nervous system problems like myasthenia gravis or Guillain-Barre syndrome  an unusual or allergic reaction to pembrolizumab, other medicines, foods, dyes, or preservatives  pregnant or trying to get pregnant  breast-feeding How should I use this medicine? This medicine is for infusion into a vein. It is given by a health care professional in a hospital or clinic setting. A special MedGuide will be given to you before each treatment. Be sure to read this information carefully each time. Talk to your pediatrician regarding the use of this medicine in children. While this drug may be prescribed for children as young as 6 months for selected conditions, precautions do apply. Overdosage: If you think you have taken too much of this medicine contact a poison control center or emergency room at once. NOTE: This medicine is only for you. Do not share this medicine with others. What if I miss a dose? It is important not to miss your dose. Call your doctor or health care professional if you are unable to keep an appointment. What may interact with this medicine? Interactions have not been studied. This list may not describe all possible interactions. Give your health care provider a list of all the medicines, herbs, non-prescription drugs, or dietary supplements you use. Also tell them if you smoke, drink alcohol, or use illegal drugs. Some items may interact with your medicine. What should I watch for while using this medicine? Your condition will be monitored carefully while you are receiving this medicine. You may need blood work done while you are taking this medicine. Do not become pregnant while taking this medicine or for 4 months after stopping it. Women should inform their doctor if they wish to become pregnant or think they might be pregnant. There is a potential for serious side effects to an unborn child. Talk to your  health care professional or pharmacist for more information. Do not breast-feed an infant while taking this medicine or for 4 months after the last dose. What side effects may I notice from receiving this medicine? Side effects that you should report to your doctor or health care professional as soon as possible:  allergic reactions like skin rash, itching or hives, swelling of the face, lips, or tongue  bloody or black, tarry  breathing problems  changes in vision  chest pain  chills  confusion  constipation  cough  diarrhea  dizziness or feeling faint or lightheaded  fast or irregular heartbeat  fever  flushing  joint pain  low blood counts - this medicine may decrease the number of white blood cells, red blood cells and platelets. You may be at increased risk for infections and bleeding.  muscle pain  muscle weakness  pain, tingling, numbness in the hands or feet  persistent headache  redness, blistering, peeling or loosening of the skin, including inside the mouth  signs and symptoms of high blood sugar such as dizziness; dry mouth; dry skin; fruity breath; nausea; stomach pain; increased hunger or thirst; increased urination  signs and symptoms of kidney injury like trouble passing urine or change in the amount of urine  signs and   symptoms of liver injury like dark urine, light-colored stools, loss of appetite, nausea, right upper belly pain, yellowing of the eyes or skin  sweating  swollen lymph nodes  weight loss Side effects that usually do not require medical attention (report to your doctor or health care professional if they continue or are bothersome):  decreased appetite  hair loss  tiredness This list may not describe all possible side effects. Call your doctor for medical advice about side effects. You may report side effects to FDA at 1-800-FDA-1088. Where should I keep my medicine? This drug is given in a hospital or clinic and will  not be stored at home. NOTE: This sheet is a summary. It may not cover all possible information. If you have questions about this medicine, talk to your doctor, pharmacist, or health care provider.  2021 Elsevier/Gold Standard (2019-06-15 21:44:53)  

## 2020-12-17 NOTE — Progress Notes (Signed)
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  Is alone.  Chad [name] 60 y.o.  male above history of stage IV-ureteral cancer/history of stage III colon cancer right side and multiple other comorbidities currently on Keytruda is here for follow-up.  In the interim patient was evaluated by surgery for his sigmoid colon cancer.  Patient denies any worsening abdominal pain denies any blood in stools or black or stools.  Denies constipation.   Review of Systems  Constitutional: Positive for malaise/fatigue. Negative for chills, diaphoresis, fever and weight loss.  HENT: Negative for nosebleeds and sore throat.   Eyes: Negative for double vision.  Respiratory:  Positive for cough and shortness of breath. Negative for hemoptysis and wheezing.   Cardiovascular: Negative for chest pain, palpitations and orthopnea.  Gastrointestinal: Positive for constipation. Negative for abdominal pain, blood in stool, diarrhea, heartburn, melena, nausea and vomiting.  Genitourinary: Negative for dysuria, frequency and urgency.  Musculoskeletal: Positive for back pain and joint pain.  Skin: Negative.  Negative for itching and rash.  Neurological: Positive for focal weakness. Negative for dizziness, tingling, weakness and headaches.       Chronic left-sided weakness upper than lower extremity.  Endo/Heme/Allergies: Does not bruise/bleed easily.  Psychiatric/Behavioral: Negative for depression. The patient is not nervous/anxious and does not have insomnia.      MEDICAL HISTORY:  Past Medical History:  Diagnosis Date  . Anemia   . Anxiety   . ARF (acute respiratory failure) ([place])   . Bladder cancer ([place])   . COPD (chronic obstructive pulmonary disease) ([place])   . Depression   . Dysphagia   . Family history of colon cancer   . Family history of kidney cancer   . Family history of leukemia   . Family history of prostate cancer   . GERD (gastroesophageal reflux disease)   . Hepatitis    chronic hep c  . Hydronephrosis   . Hydronephrosis with ureteral stricture   . Hyperlipidemia   . Knee pain    Left  . Malignant neoplasm of colon ([place])   . Nerve pain   . Peripheral vascular disease ([place])   . Prostate cancer ([place])   . Stroke ([place])   . Urinary frequency   . Venous hypertension of both lower extremities  SURGICAL HISTORY: Past Surgical History:  Procedure Laterality Date  . COLON SURGERY     En bloc extended right hemicolectomy 07/2017  . COLONOSCOPY WITH PROPOFOL N/A 11/06/2020   Procedure: COLONOSCOPY WITH PROPOFOL;  Surgeon: Jonathon Bellows, MD;  [facility];  Service: Gastroenterology;  Laterality: N/A;  . CYSTOSCOPY W/ RETROGRADES Right  08/30/2018   Procedure: CYSTOSCOPY WITH RETROGRADE PYELOGRAM;  Surgeon: Hollice Espy, MD;  Location: [facility] ORS;  Service: Urology;  Laterality: Right;  . CYSTOSCOPY WITH STENT PLACEMENT Right 04/25/2018   Procedure: CYSTOSCOPY WITH STENT PLACEMENT;  Surgeon: Hollice Espy, MD;  Location: [facility] ORS;  Service: Urology;  Laterality: Right;  . CYSTOSCOPY WITH STENT PLACEMENT Right 08/30/2018   Procedure: [place] WITH STENT Exchange;  Surgeon: Hollice Espy, MD;  Location: [facility] ORS;  Service: Urology;  Laterality: Right;  . CYSTOSCOPY WITH STENT PLACEMENT Right 03/07/2019   Procedure: CYSTOSCOPY WITH STENT Exchange;  Surgeon: Hollice Espy, MD;  Location: [facility] ORS;  Service: Urology;  Laterality: Right;  . CYSTOSCOPY WITH STENT PLACEMENT Right 11/21/2019   Procedure: CYSTOSCOPY WITH STENT Exchange;  Surgeon: Hollice Espy, MD;  Location: [facility] ORS;  Service: Urology;  Laterality: Right;  . LOWER EXTREMITY ANGIOGRAPHY Left 05/23/2019   Procedure: LOWER EXTREMITY ANGIOGRAPHY;  Surgeon: Algernon Huxley, MD;  Location: [place] [facility];  Service: Cardiovascular;  Laterality: Left;  . LOWER EXTREMITY ANGIOGRAPHY Right 05/30/2019   Procedure: LOWER EXTREMITY ANGIOGRAPHY;  Surgeon: Algernon Huxley, MD;  Location: [place] [facility];  Service: Cardiovascular;  Laterality: Right;  . LOWER EXTREMITY ANGIOGRAPHY Right 02/13/2020   Procedure: LOWER EXTREMITY ANGIOGRAPHY;  Surgeon: Algernon Huxley, MD;  Location: [place] [facility];  Service: Cardiovascular;  Laterality: Right;  . LOWER EXTREMITY ANGIOGRAPHY Left 02/20/2020   Procedure: LOWER EXTREMITY ANGIOGRAPHY;  Surgeon: Algernon Huxley, MD;  Location: [place] [facility];  Service: Cardiovascular;  Laterality: Left;  . PORTA CATH INSERTION N/A 02/28/2019   Procedure: PORTA CATH INSERTION;  Surgeon: Algernon Huxley, MD;  Location: [place] [facility];  Service: Cardiovascular;  Laterality: N/A;  . tumor removed       SOCIAL HISTORY: Social History    Socioeconomic History  . Marital status: Single    Spouse name: Not on file  . Number of children: Not on file  . Years of education: Not on file  . Highest education level: Not on file  Occupational History  . Not on file  Tobacco Use  . Smoking status: Current Every Day Smoker    Packs/day: 1.00    Types: Cigarettes  . Smokeless tobacco: Never Used  Vaping Use  . Vaping Use: Never used  Substance and Sexual Activity  . Alcohol use: Not Currently  . Drug use: Not Currently  . Sexual activity: Not Currently  Other Topics Concern  . Not on file  Social History Narrative    used to live [place]; moved  To [place]- end of April 2019; in Nursing home; 1pp/day; quit alcohol. 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        Family- dad- prostate ca [at 2y]; brother- 73 died of prostate cancer; brother- 63- no cancers [[place]]; sonGerald Stabs [[place]];[name]-32y prostate ca [facility] [place]]; daughter- 58 [[place]]; another daughter 70 [[place]/addict]. will refer genetics counseling. Given MSI- abnormal; highly suspicious of Lynch syndrome.  Patient's son [name] aware of high possible lynch syndrome.   Social Determinants of Health   Financial Resource Strain: Not on file  Food Insecurity: Not on  file  Transportation Needs: Not on file  Physical Activity: Not on file  Stress: Not on file  Social Connections: Not on file  Intimate Partner Violence: Not on file    FAMILY HISTORY: Family History  Problem Relation Age of Onset  . Prostate cancer Father 53  . Cancer Brother 27       unsure type  . Cancer Paternal Uncle        unsure type  . Cancer Maternal Grandmother        unsure type  . Cancer Paternal Grandmother        unsure type  . Kidney cancer Paternal Grandfather   . Cancer Other        unsure types  . Leukemia Son   . Cancer Son        other cancers, possibly colon    ALLERGIES:  is allergic to penicillins.  MEDICATIONS:  Current Outpatient Medications  Medication Sig Dispense Refill   . acetaminophen (TYLENOL) 325 MG tablet Take 650 mg by mouth 3 (three) times daily.     Marland Kitchen aspirin EC 81 MG tablet Take 1 tablet (81 mg total) by mouth daily. 150 tablet 2  . Cholecalciferol (VITAMIN D) 50 MCG (2000 UT) CAPS Take by mouth.    . clopidogrel (PLAVIX) 75 MG tablet Take 1 tablet (75 mg total) by mouth daily. 30 tablet 11  . cyclobenzaprine (FLEXERIL) 10 MG tablet Take 10 mg by mouth at bedtime.    Marland Kitchen escitalopram (LEXAPRO) 5 MG tablet Take 5 mg by mouth daily.    Marland Kitchen gabapentin (NEURONTIN) 100 MG capsule Take 100 mg by mouth 3 (three) times daily.    . hydroxypropyl methylcellulose / hypromellose (ISOPTO TEARS / GONIOVISC) 2.5 % ophthalmic solution Place 1 drop into both eyes every 12 (twelve) hours.    . mirtazapine (REMERON) 7.5 MG tablet Take 7.5 mg by mouth at bedtime.    . Oxycodone HCl 10 MG TABS Take 10 mg by mouth every 6 (six) hours as needed for pain.    . pantoprazole (PROTONIX) 40 MG tablet Take 40 mg by mouth daily.    . Pollen Extracts (PROSTAT PO) Take 30 mLs by mouth.    . pravastatin (PRAVACHOL) 10 MG tablet Take 10 mg by mouth daily.    . RESTORE CALCIUM ALGINATE EX Apply topically. Note: Dressing located on left ankle. Facility cleaning wound "every day shift and applying wound cleanser and appication of calcium Alginate and cover with dry dressing and wrapped with Kerlix"    . atorvastatin (LIPITOR) 10 MG tablet Take 1 tablet (10 mg total) by mouth daily. 30 tablet 11  . polyvinyl alcohol (LIQUIFILM TEARS) 1.4 % ophthalmic solution 1 gtt both eyes every 12 hours     No current facility-administered medications for this visit.      Marland Kitchen  PHYSICAL EXAMINATION: ECOG PERFORMANCE STATUS: 1 - Symptomatic but completely ambulatory  Vitals:   12/17/20 0917  BP: 112/78  Pulse: 74  Resp: 16  Temp: 98.8 F (37.1 C)  SpO2: 97%   Filed Weights   12/17/20 0917  Weight: 155 lb (70.3 kg)    Physical Exam Constitutional:      Comments: In wheelchair. Cachectic.   Appears older than his stated age.  Alone.  HENT:     Head: Normocephalic and atraumatic.     Mouth/Throat:     Pharynx: No oropharyngeal exudate.  Eyes:     Pupils: Pupils are equal, round, and reactive to light.  Cardiovascular:     Rate and Rhythm: Normal rate and regular rhythm.  Pulmonary:     Effort: No respiratory distress.     Breath sounds: No wheezing.  Abdominal:     General: Bowel sounds are normal. There is no distension.     Palpations: Abdomen is soft. There is no mass.     Tenderness: There is no abdominal tenderness. There is no guarding or rebound.  Musculoskeletal:        General: No tenderness. Normal range of motion.     Cervical back: Normal range of motion and neck supple.  Skin:    General: Skin is warm.     Comments: Bilateral lower extremity ulcerations noted.  Pulses intact bilaterally.  Neurological:     Mental Status: He is oriented to person, place, and time.     Comments: Sleepy but easily arousable.  Chronic weakness left upper and lower extremity.  Psychiatric:        Mood and Affect: Affect normal.      LABORATORY DATA:  I have reviewed the data as listed Lab Results  Component Value Date   WBC 8.1 12/17/2020   HGB 12.7 (L) 12/17/2020   HCT 40.2 12/17/2020   MCV 78.4 (L) 12/17/2020   PLT 196 12/17/2020   Recent Labs    03/08/20 0905 03/29/20 0824 04/19/20 0858 05/10/20 0915 10/29/20 0816 11/19/20 0850 12/17/20 0858  NA 137 139 134*   < > 137 136 136  K 4.4 4.1 3.8   < > 4.2 4.1 4.0  CL 103 104 101   < > 104 101 102  CO2 '25 24 26   ' < > '25 24 24  ' GLUCOSE 88 94 140*   < > 89 89 82  BUN '18 21 19   ' < > 19 23 25*  CREATININE 0.85 0.76 0.92   < > 0.94 0.93 0.96  CALCIUM 8.8* 8.6* 8.4*   < > 8.7* 8.8* 8.6*  GFRNONAA >60 >60 >60   < > >60 >60 >60  GFRAA >60 >60 >60  --   --   --   --   PROT 8.0 7.7 7.8   < > 7.9 7.9 7.8  ALBUMIN 3.8 3.6 3.7   < > 3.8 3.8 3.8  AST 32 28 33   < > 35 37 30  ALT '26 20 24   ' < > '20 22 18  ' ALKPHOS 127*  118 115   < > 103 96 100  BILITOT 0.6 0.4 0.4   < > 0.5 0.7 0.6   < > = values in this interval not displayed.    RADIOGRAPHIC STUDIES: I have personally reviewed the radiological images as listed and agreed with the findings in the report. No results found.  ASSESSMENT & PLAN:   Ureteral cancer, right ([place]) # High-grade urothelial cancer/cytology; likely of the right renal pelvis /upper ureter. JAN 5th, 2022- CT findings worrisome for progressive right renal pelvis and ureteral tumor and mass suspicious for colonic neoplasm involving the descending colon sigmoid colon junction region [see below].  Mild soft tissue thickening around the distal right ureter could reflect ureteral tumor; FEB 2022-  RIGHT renal pelvis and bladder are not evaluable by FDG PET imaging due to high activity of radiotracer within the urine. STABLE; but see below re: sigmoid mass/colon cancer  # proceed with  [place] today; Labs today reviewed; Labs today reviewed;  acceptable for treatment today.   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  Discussed with Dr. Bary Castilla given patient's stage IV ureteral malignancy/also given patient multiple medical problems including severe vascular disease-I think it is reasonable to continue observation with a repeat colonoscopy in 6 months.  Patient is high risk of surgical complications at this time.  # Iron deficiency anemia-hemoglobin 11.5; February 2022 ron studies/ferritin-LOW; plan Venofer with infusions;  STABLE.   # Bilateral LE ulcers-s/p wound care evaluation; s/p PTCA with Dr.Dew. bil Arterial Dopp-wnl; STABLE [Dr.]  #DISPOSITION: #  Keytruda; HOLD venofer today;  # follow up in 3 weeks-;MD labs-cbc/cmp;  Keytruda; - Dr.B.   All questions were answered. The patient knows to call the clinic with any problems, questions or concerns.    Cammie Sickle,  MD 12/20/2020 7:32 AM

## 2020-12-19 NOTE — Progress Notes (Signed)
CLIMMIE, BUELOW ([id number]) Visit Report for 12/14/2020 Arrival Information Details Patient Name: Chad [name], Chad [name] Date of Service: 12/14/2020 11:15 AM Medical Record Number: [id number] Patient Account Number: [id number] Date of Birth/Sex: [birth year] (60 y.o. M) Treating RN: Cornell Barman Primary Care Reba Hulett: Clayborn Bigness Other Clinician: Referring Stylianos Stradling: Clayborn Bigness Treating Alylah Blakney/Extender: Skipper Cliche in Treatment: 17 Visit Information History Since Last Visit Added or deleted any medications: No Patient Arrived: Wheel Chair Has Dressing in Place as Prescribed: Yes Arrival Time: 10:48 Pain Present Now: No Accompanied By: self Transfer Assistance: None Patient Identification Verified: Yes Secondary Verification Process Completed: Yes Patient Requires Transmission-Based No Precautions: Patient Has Alerts: Yes Patient Alerts: Patient on Blood Thinner ABI right 1.16 ABI left 1.14 Electronic Signature(s) Signed: 12/18/2020 5:46:47 PM By: Gretta Cool, BSN, RN, CWS, Kim RN, BSN Entered By: Gretta Cool, BSN, RN, CWS, Kim on 12/14/2020 10:48:21 Chad [name] ([id number]) -------------------------------------------------------------------------------- Clinic Level of Care Assessment Details Patient Name: Chad [name] Date of Service: 12/14/2020 11:15 AM Medical Record Number: [id number] Patient Account Number: [id number] Date of Birth/Sex: 10-15-1960 (60 y.o. M) Treating RN: Carlene Coria Primary Care Lashona Schaaf: Clayborn Bigness Other Clinician: Referring Tarron Krolak: Clayborn Bigness Treating Brock Larmon/Extender: Skipper Cliche in Treatment: 17 Clinic Level of Care Assessment Items TOOL 1 Quantity Score '[]'  - Use when EandM and Procedure is performed on INITIAL visit 0 ASSESSMENTS - Nursing Assessment / Reassessment '[]'  - General Physical Exam (combine w/ comprehensive assessment (listed just below) when performed on new 0 pt. evals) '[]'  - 0 Comprehensive Assessment (HX, ROS, Risk Assessments,  Wounds Hx, etc.) ASSESSMENTS - Wound and Skin Assessment / Reassessment '[]'  - Dermatologic / Skin Assessment (not related to wound area) 0 ASSESSMENTS - Ostomy and/or Continence Assessment and Care '[]'  - Incontinence Assessment and Management 0 '[]'  - 0 Ostomy Care Assessment and Management (repouching, etc.) PROCESS - Coordination of Care '[]'  - Simple Patient / Family Education for ongoing care 0 '[]'  - 0 Complex (extensive) Patient / Family Education for ongoing care '[]'  - 0 Staff obtains Programmer, systems, Records, Test Results / Process Orders '[]'  - 0 Staff telephones HHA, Nursing Homes / Clarify orders / etc '[]'  - 0 Routine Transfer to another Facility (non-emergent condition) '[]'  - 0 Routine Hospital Admission (non-emergent condition) '[]'  - 0 New Admissions / Biomedical engineer / Ordering NPWT, Apligraf, etc. '[]'  - 0 Emergency Hospital Admission (emergent condition) PROCESS - Special Needs '[]'  - Pediatric / Minor Patient Management 0 '[]'  - 0 Isolation Patient Management '[]'  - 0 Hearing / Language / Visual special needs '[]'  - 0 Assessment of Community assistance (transportation, D/C planning, etc.) '[]'  - 0 Additional assistance / Altered mentation '[]'  - 0 Support Surface(s) Assessment (bed, cushion, seat, etc.) INTERVENTIONS - Miscellaneous '[]'  - External ear exam 0 '[]'  - 0 Patient Transfer (multiple staff / Civil Service fast streamer / Similar devices) '[]'  - 0 Simple Staple / Suture removal (25 or less) '[]'  - 0 Complex Staple / Suture removal (26 or more) '[]'  - 0 Hypo/Hyperglycemic Management (do not check if billed separately) '[]'  - 0 Ankle / Brachial Index (ABI) - do not check if billed separately Has the patient been seen at the hospital within the last three years: Yes Total Score: 0 Level Of Care: ____ Chad [name] ([id number]) Electronic Signature(s) Signed: 12/17/2020 2:18:56 PM By: Carlene Coria RN Entered By: Carlene Coria on 12/14/2020 11:34:45 Chad [name]  ([id number]) -------------------------------------------------------------------------------- Lower Extremity Assessment Details Patient Name: Chad [name] Date of Service: 12/14/2020 11:15 AM Medical Record Number: [id number] Patient Account Number: [id number] Date  of Birth/Sex: 12/09/1960 (60 y.o. M) Treating RN: Cornell Barman Primary Care Eddye Broxterman: Clayborn Bigness Other Clinician: Referring Juergen Hardenbrook: Clayborn Bigness Treating Rexford Prevo/Extender: Jeri Cos Weeks in Treatment: 17 Edema Assessment Assessed: [Left: No] [Right: No] Edema: [Left: N] [Right: o] Vascular Assessment Pulses: Dorsalis Pedis Palpable: [Left:Yes] Electronic Signature(s) Signed: 12/18/2020 5:46:47 PM By: Gretta Cool, BSN, RN, CWS, Kim RN, BSN Entered By: Gretta Cool, BSN, RN, CWS, Kim on 12/14/2020 10:53:51 Chad [name] ([id number]) -------------------------------------------------------------------------------- Multi Wound Chart Details Patient Name: Chad [name] Date of Service: 12/14/2020 11:15 AM Medical Record Number: [id number] Patient Account Number: [id number] Date of Birth/Sex: 09-01-1960 (60 y.o. M) Treating RN: Carlene Coria Primary Care [place]: Clayborn Bigness Other Clinician: Referring Allie Gerhold: Clayborn Bigness Treating Johnmatthew Solorio/Extender: Skipper Cliche in Treatment: 17 Vital Signs Height(in): 64 Pulse(bpm): 25 Weight(lbs): 160 Blood Pressure(mmHg): 127/76 Body Mass Index(BMI): 25 Temperature(F): 98.2 Respiratory Rate(breaths/min): 16 Photos: [N/A:N/A] Wound Location: Left, Medial Ankle N/A N/A Wounding Event: Gradually Appeared N/A N/A Primary Etiology: Venous Leg Ulcer N/A N/A Comorbid History: Anemia, Chronic Obstructive N/A N/A Pulmonary Disease (COPD), Hepatitis C, Received Chemotherapy Date Acquired: 07/12/2019 N/A N/A Weeks of Treatment: 17 N/A N/A Wound Status: Open N/A N/A Measurements L x W x D (cm) 3.2x2x0.2 N/A N/A Area (cm) : 5.027 N/A N/A Volume (cm) : 1.005 N/A N/A % Reduction in  Area: 14.70% N/A N/A % Reduction in Volume: -70.60% N/A N/A Classification: Full Thickness Without Exposed N/A N/A Support Structures Exudate Amount: Medium N/A N/A Exudate Type: Serous N/A N/A Exudate Color: amber N/A N/A Wound Margin: Flat and Intact N/A N/A Granulation Amount: Small (1-33%) N/A N/A Granulation Quality: Pink N/A N/A Necrotic Amount: Large (67-100%) N/A N/A Exposed Structures: Fat Layer (Subcutaneous Tissue): N/A N/A Yes Fascia: No Tendon: No Muscle: No Joint: No Bone: No Epithelialization: Small (1-33%) N/A N/A Treatment Notes Electronic Signature(s) Signed: 12/17/2020 2:18:56 PM By: Carlene Coria RN Entered By: Carlene Coria on 12/14/2020 11:28:45 Chad [name] ([id number]) -------------------------------------------------------------------------------- Multi-Disciplinary Care Plan Details Patient Name: Chad [name] Date of Service: 12/14/2020 11:15 AM Medical Record Number: [id number] Patient Account Number: [id number] Date of Birth/Sex: 03/05/1961 (60 y.o. M) Treating RN: Carlene Coria Primary Care Kadesia Robel: Clayborn Bigness Other Clinician: Referring Elane Peabody: Clayborn Bigness Treating Daanya Lanphier/Extender: Skipper Cliche in Treatment: 17 Active Inactive Wound/Skin Impairment Nursing Diagnoses: Knowledge deficit related to ulceration/compromised skin integrity Goals: Patient/caregiver will verbalize understanding of skin care regimen Date Initiated: 08/14/2020 Date Inactivated: 10/09/2020 Target Resolution Date: 09/14/2020 Goal Status: Met Ulcer/skin breakdown will have a volume reduction of 30% by week 4 Date Initiated: 08/14/2020 Date Inactivated: 10/19/2020 Target Resolution Date: 09/14/2020 Goal Status: Met Ulcer/skin breakdown will heal within 14 weeks Date Initiated: 12/07/2020 Target Resolution Date: 01/23/2021 Goal Status: Active Interventions: Assess patient/caregiver ability to obtain necessary supplies Assess patient/caregiver ability to perform  ulcer/skin care regimen upon admission and as needed Assess ulceration(s) every visit Notes: Electronic Signature(s) Signed: 12/17/2020 2:18:56 PM By: Carlene Coria RN Entered By: Carlene Coria on 12/14/2020 11:28:36 Chad [name] ([id number]) -------------------------------------------------------------------------------- Pain Assessment Details Patient Name: Chad [name] Date of Service: 12/14/2020 11:15 AM Medical Record Number: [id number] Patient Account Number: [id number] Date of Birth/Sex: 1960-11-05 (60 y.o. M) Treating RN: Cornell Barman Primary Care Kazimierz Springborn: Clayborn Bigness Other Clinician: Referring Marcia Lepera: Clayborn Bigness Treating Nathyn Luiz/Extender: Skipper Cliche in Treatment: 17 Active Problems Location of Pain Severity and Description of Pain Patient Has Paino Yes Site Locations Pain Location: Generalized Pain Rate the pain. Current Pain Level: 5 Pain Management and Medication Current Pain Management: Notes Patient states he takes oxy  for pain in knees. Electronic Signature(s) Signed: 12/18/2020 5:46:47 PM By: Gretta Cool, BSN, RN, CWS, Kim RN, BSN Entered By: Gretta Cool, BSN, RN, CWS, Kim on 12/14/2020 10:49:47 Chad [name] ([id number]) -------------------------------------------------------------------------------- Patient/Caregiver Education Details Patient Name: Chad [name] Date of Service: 12/14/2020 11:15 AM Medical Record Number: [id number] Patient Account Number: [id number] Date of Birth/Gender: 1960/09/07 (60 y.o. M) Treating RN: Carlene Coria Primary Care Physician: Clayborn Bigness Other Clinician: Referring Physician: Clayborn Bigness Treating Physician/Extender: Skipper Cliche in Treatment: 17 Education Assessment Education Provided To: Patient Education Topics Provided Wound/Skin Impairment: Methods: Explain/Verbal Responses: State content correctly Electronic Signature(s) Signed: 12/17/2020 2:18:56 PM By: Carlene Coria RN Entered By: Carlene Coria on  12/14/2020 11:34:58 Chad [name] ([id number]) -------------------------------------------------------------------------------- Wound Assessment Details Patient Name: Chad [name] Date of Service: 12/14/2020 11:15 AM Medical Record Number: [id number] Patient Account Number: [id number] Date of Birth/Sex: 02-Apr-1961 (60 y.o. M) Treating RN: Cornell Barman Primary Care Rogen Porte: Clayborn Bigness Other Clinician: Referring Katalia Choma: Clayborn Bigness Treating Leon Montoya/Extender: Jeri Cos Weeks in Treatment: 17 Wound Status Wound Number: [id number] Primary Venous Leg Ulcer Etiology: Wound Location: Left, Medial Ankle Wound Open Wounding Event: Gradually Appeared Status: Date Acquired: 07/12/2019 Comorbid Anemia, Chronic Obstructive Pulmonary Disease (COPD), Weeks Of Treatment: 17 History: Hepatitis C, Received Chemotherapy Clustered Wound: No Photos Wound Measurements Length: (cm) 3.2 Width: (cm) 2 Depth: (cm) 0.2 Area: (cm) 5.027 Volume: (cm) 1.005 % Reduction in Area: 14.7% % Reduction in Volume: -70.6% Epithelialization: Small (1-33%) Wound Description Classification: Full Thickness Without Exposed Support Structu Wound Margin: Flat and Intact Exudate Amount: Medium Exudate Type: Serous Exudate Color: amber res Foul Odor After Cleansing: No Slough/Fibrino Yes Wound Bed Granulation Amount: Small (1-33%) Exposed Structure Granulation Quality: Pink Fascia Exposed: No Necrotic Amount: Large (67-100%) Fat Layer (Subcutaneous Tissue) Exposed: Yes Necrotic Quality: Adherent Slough Tendon Exposed: No Muscle Exposed: No Joint Exposed: No Bone Exposed: No Electronic Signature(s) Signed: 12/18/2020 5:46:47 PM By: Gretta Cool, BSN, RN, CWS, Kim RN, BSN Entered By: Gretta Cool, BSN, RN, CWS, Kim on 12/14/2020 10:54:56 Chad [name] ([id number]) -------------------------------------------------------------------------------- [place] Details Patient Name: Chad [name] Date of Service: 12/14/2020  11:15 AM Medical Record Number: [id number] Patient Account Number: [id number] Date of Birth/Sex: [birth year] (60 y.o. M) Treating RN: Cornell Barman Primary Care Meshell Abdulaziz: Clayborn Bigness Other Clinician: Referring Arelene Moroni: Clayborn Bigness Treating Shiquan Mathieu/Extender: Skipper Cliche in Treatment: 17 Vital Signs Time Taken: 10:48 Temperature (F): 98.2 Height (in): 67 Pulse (bpm): 81 Weight (lbs): 160 Respiratory Rate (breaths/min): 16 Body Mass Index (BMI): 25.1 Blood Pressure (mmHg): 127/76 Reference Range: 80 - 120 mg / dl Electronic Signature(s) Signed: 12/18/2020 5:46:47 PM By: Gretta Cool, BSN, RN, CWS, Kim RN, BSN Entered By: Gretta Cool, BSN, RN, CWS, Kim on 12/14/2020 10:48:45

## 2020-12-20 ENCOUNTER — Encounter: Payer: Self-pay | Admitting: Internal Medicine

## 2020-12-21 ENCOUNTER — Ambulatory Visit: Payer: Medicaid Other | Admitting: Internal Medicine

## 2020-12-28 ENCOUNTER — Other Ambulatory Visit: Payer: Self-pay

## 2020-12-28 ENCOUNTER — Encounter: Payer: Medicaid Other | Attending: Physician Assistant | Admitting: Physician Assistant

## 2020-12-28 DIAGNOSIS — L97322 Non-pressure chronic ulcer of left ankle with fat layer exposed: Secondary | ICD-10-CM | POA: Insufficient documentation

## 2020-12-28 DIAGNOSIS — L97412 Non-pressure chronic ulcer of right heel and midfoot with fat layer exposed: Secondary | ICD-10-CM | POA: Diagnosis not present

## 2020-12-28 DIAGNOSIS — I872 Venous insufficiency (chronic) (peripheral): Secondary | ICD-10-CM | POA: Diagnosis not present

## 2020-12-28 DIAGNOSIS — I69354 Hemiplegia and hemiparesis following cerebral infarction affecting left non-dominant side: Secondary | ICD-10-CM | POA: Insufficient documentation

## 2020-12-28 DIAGNOSIS — Z5900 Homelessness unspecified: Secondary | ICD-10-CM | POA: Diagnosis not present

## 2020-12-28 DIAGNOSIS — L97512 Non-pressure chronic ulcer of other part of right foot with fat layer exposed: Secondary | ICD-10-CM | POA: Diagnosis not present

## 2020-12-28 DIAGNOSIS — C689 Malignant neoplasm of urinary organ, unspecified: Secondary | ICD-10-CM | POA: Insufficient documentation

## 2020-12-28 DIAGNOSIS — B1920 Unspecified viral hepatitis C without hepatic coma: Secondary | ICD-10-CM | POA: Diagnosis not present

## 2020-12-28 NOTE — Progress Notes (Addendum)
GARISON, GENOVA ([id number]) Visit Report for 12/28/2020 Chief Complaint Document Details Patient Name: Chad [name], Chad [name] Date of Service: 12/28/2020 8:15 AM Medical Record Number: [id number] Patient Account Number: [id number] Date of Birth/Sex: Nov 30, 1960 (60 y.o. M) Treating RN: Carlene Coria Primary Care Provider: Clayborn Bigness Other Clinician: Referring Provider: Clayborn Bigness Treating Provider/Extender: Skipper Cliche in Treatment: 19 Information Obtained from: Patient Chief Complaint Left ankle and right foot and heel ulcers Electronic Signature(s) Signed: 12/28/2020 8:19:49 AM By: Worthy Keeler PA-C Entered By: Worthy Keeler on 12/28/2020 08:19:49 Chad [name] ([id number]) -------------------------------------------------------------------------------- HPI Details Patient Name: Chad [name] Date of Service: 12/28/2020 8:15 AM Medical Record Number: [id number] Patient Account Number: [id number] Date of Birth/Sex: 19-May-1961 (60 y.o. M) Treating RN: Carlene Coria Primary Care Provider: Clayborn Bigness Other Clinician: Referring Provider: Clayborn Bigness Treating Provider/Extender: Skipper Cliche in Treatment: 19 History of Present Illness HPI Description: 10/08/18 on evaluation today patient actually presents to our office for initial evaluation concerning wounds that he has of the bilateral lower extremities. 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 He also has bilateral venous stasis. He apparently has been homeless currently following discharge from the hospital apparently he has been placed at [facility] which is is a skilled nursing facility locally. Nonetheless fortunately he does not show any signs of infection at this time which is good news. In fact several of the wound actually appears to be showing some signs of  improvement already in my pinion. There are a couple areas in the left leg in particular there likely gonna require some sharp debridement to help clear away some necrotic tissue and help with more sufficient healing. No fevers, chills, nausea, or vomiting noted at this time. 10/15/18 on evaluation today patient actually appears to be doing very well in regard to his bilateral lower extremities. He's been tolerating the dressing changes without complication. Fortunately there does not appear to be any evidence of active infection at this time which is great news. Overall I'm actually very pleased with how this has progressed in just one visits time. Readmission: 08/14/2020 upon evaluation today patient presents for re-evaluation here in our clinic. He is having issues with his left ankle region as well as his right toe and his right heel. He tells me that the toe and heel actually began as a area that was itching that he was scratching and then subsequently opened up into wounds. These may have been abscess areas I presume based on what I am seeing currently. With regard to his left ankle region he tells me this was a similar type occurrence although he does have venous stasis this very well may be more of a venous leg ulcer more than anything. Nonetheless I do believe that the patient would benefit from appropriate and aggressive wound care to try to help get things under better control here. He does have history of a stroke on the left side affecting him to some degree there that he is able to stand although he does have some residual weakness. Otherwise again the patient does have chronic venous insufficiency as previously noted. His arterial studies most recently obtained showed that he had an ABI on the right of 1.16 with a TBI of 0.52 and on the left and ABI of 1.14 with a TBI of 0.81. That was obtained on 06/19/2020. 08/28/2020 upon evaluation today  patient appears to be doing decently well in  regard to his wounds in general. He has been tolerating the dressing changes without complication. Fortunately there does not appear to be any signs of active infection which is great news. With that being said I think the [facility] is doing a good job I would recommend that we likely continue with that currently. 09/11/2020 upon evaluation today patient's wounds did not appear to be doing too poorly but again he is not really showing signs of significant improvement with regard to any of the wounds on the right. None of them have Hydrofera Blue on them I am not exactly sure why this is not being followed as the facility did not contact us to let us know of any issues with obtaining dressings or otherwise. With that being said he is supposed to be using Hydrofera Blue on both of the wounds on the right foot as well as the ankle wound on the left side. 09/18/2020 upon evaluation today patient appears to be doing poorly with regard to his wounds. Again right now the left ankle in particular showed signs of extreme maceration. Apparently he was told by someone with staff at [place] they could not get the [facility]. With that being said this is something that is never been relayed to [place] one way or another. Also the patient subsequently has not supposed to have a border gauze dressing on. He should have an ABD pad and roll gauze to secure as this drains much too much just to have a border gauze dressing to cover. Nonetheless the fact that they are not using the appropriate dressing is directly causing deterioration of the left ankle wound it is significantly worse today compared to what it was previous. I did attempt to call [facility] while the patient was here I called three times and got no one to even pick up the phone. After this I had my for an office coordinator call and she was able to finally get through and leave a message with the D ON as of dictation of this note  which is roughly about an hour and a half later I still have not been able to speak with anyone at the facility. 09/25/2020 upon evaluation today patient actually showing signs of good improvement which is excellent news. He has been tolerating the dressing changes without complication. Fortunately there is no signs of active infection which is great news. No fevers, chills, nausea, vomiting, or diarrhea. I do feel like the facility has been doing a much better job at taking care of him as far as the dressings are concerned. However the director of nursing never did call me back. 10/09/2020 upon evaluation today patient appears to be doing well with regard to his wound. The toe ulcer did require some debridement but the other 2 areas actually appear to be doing quite well. 10/19/2020 upon evaluation today patient actually appears to be doing very well in regard to his wounds. In fact the heel does appear to be completely healed. The toe is doing better in the medial ankle on the left is also doing better. Overall I think he is headed in the right direction. 10/26/2020 upon evaluation today patient appears to be doing well with regard to his wound. He is showing signs of improvement which is great news and overall I am very pleased with where things stand today. No fevers, chills, nausea, vomiting, or diarrhea. 11/02/2020 upon evaluation today patient appears to  be doing well with regard to his wounds. He has been tolerating the dressing changes without complication overall I am extremely pleased with where things stand today. He in regard to the toe is almost completely healed and the medial ankle on the left is doing much better. 11/09/2020 upon evaluation today patient appears to be doing a little poorly in regard to his left medial ankle ulcer. Fortunately there does not appear to be any signs of systemic infection but unfortunately locally he does appear to be infected in fact he has blue-green drainage  consistent with Pseudomonas. Chad [name], Chad [name] ([id number]) 11/16/2020 upon evaluation today patient appears to be doing well with regard to his wound. It actually appears to be doing better. I did place him on gentamicin cream since the Cipro was actually resistant even though he was positive for Pseudomonas on culture. Overall I think that he does seem to be doing better though I am unsure whether or not they have actually been putting the cream on. The patient is not sure that we did talk to the nurse directly and she was going to initiate that treatment. Fortunately there does not appear to be any signs of active infection at this time. No fevers, chills, nausea, vomiting, or diarrhea. 4/28; the area on the right second toe is close to healed. Left medial ankle required debridement 12/07/2020 upon evaluation today patient appears to be doing well with regard to his wounds. In fact the right second toe appears to be completely healed which is great news. Fortunately there does not appear to be any signs of active infection at this time which is also great news. I think we can probably discontinue the gentamicin on top of everything else. 12/14/2020 upon evaluation today patient appears to be doing well with regard to his wound. He is making good progress and overall very pleased with where things stand today. There is no signs of active infection at this time which is great news. 12/28/2020 upon evaluation today patient appears to be doing well with regard to his wounds. He has been tolerating the dressing changes without complication. Fortunately there is no signs of active infection at this time. No fevers, chills, nausea, vomiting, or diarrhea. 12/28/2020 upon evaluation today patient's wound bed actually showed signs of excellent improvement. He has great epithelization and granulation I do not see any signs of infection overall I am extremely pleased with where things stand at this point. No fevers,  chills, nausea, vomiting, or diarrhea. Electronic Signature(s) Signed: 12/28/2020 10:21:53 AM By: Worthy Keeler PA-C Entered By: Worthy Keeler on 12/28/2020 10:21:53 Chad [name] ([id number]) -------------------------------------------------------------------------------- Physical Exam Details Patient Name: Chad [name] Date of Service: 12/28/2020 8:15 AM Medical Record Number: [id number] Patient Account Number: [id number] Date of Birth/Sex: Mar 28, 1961 (60 y.o. M) Treating RN: Carlene Coria Primary Care Provider: Clayborn Bigness Other Clinician: Referring Provider: Clayborn Bigness Treating Provider/Extender: Skipper Cliche in Treatment: 19 Constitutional Well-nourished and well-hydrated in no acute distress. Respiratory normal breathing without difficulty. Psychiatric this patient is able to make decisions and demonstrates good insight into disease process. Alert and Oriented x 3. pleasant and cooperative. Notes Patient's wound bed showed signs of good granulation epithelization again I think that the collagen is doing a great job for him I do not see any evidence of infection and I think at this point my suggestion would be for [place] to continue with such with the current wound care measures. Electronic Signature(s) Signed: 12/28/2020 10:22:11 AM By: Worthy Keeler  PA-C Entered By: Worthy Keeler on 12/28/2020 10:22:10 Chad [name] ([id number]) -------------------------------------------------------------------------------- Physician Orders Details Patient Name: Chad [name] Date of Service: 12/28/2020 8:15 AM Medical Record Number: [id number] Patient Account Number: [id number] Date of Birth/Sex: April 03, 1961 (60 y.o. 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 Wound Treatment Wound #8 - Ankle Wound Laterality: Left, Medial Cleanser: Normal Saline 1 x Per Day/30 Days Discharge Instructions: Wash your hands with soap and water. Remove old dressing, discard into plastic bag and place into trash. Cleanse the wound with Normal Saline prior to applying a clean dressing using gauze sponges, not tissues or cotton balls. Do not scrub or use excessive force. Pat dry using gauze sponges, not tissue or cotton balls. Peri-Wound Care: Desitin Maximum Strength Ointment 4 (oz) 1 x Per Day/30 Days Discharge Instructions: Zinc periwound Primary Dressing: Prisma 4.34 (in) 1 x Per Day/30 Days Discharge Instructions: Moisten w/normal saline or sterile water; Cover wound as directed. Do not remove from wound bed. Secondary Dressing: ABD Pad 5x9 (in/in) 1 x Per Day/30 Days Discharge Instructions: Cover with ABD pad Secondary Dressing: Kerlix 4.5 x 4.1 (in/yd) 1 x Per Day/30 Days Discharge Instructions: Apply Kerlix 4.5 x 4.1 (in/yd) as instructed Secured With: 16M Medipore H Soft Cloth Surgical Tape, 2x2 (in/yd) 1 x Per Day/30 Days Electronic Signature(s) Signed: 12/28/2020 5:30:36 PM By: Worthy Keeler PA-C Signed: 01/01/2021 8:01:20 AM By: Carlene Coria RN Entered By: Carlene Coria on 12/28/2020 08:46:36 Chad [name] ([id number]) -------------------------------------------------------------------------------- Problem List Details Patient Name: Chad [name] Date of Service: 12/28/2020 8:15 AM Medical Record Number: [id number] Patient  Account Number: [id number] Date of Birth/Sex: Feb 07, 1961 (60 y.o. M) Treating RN: Carlene Coria Primary Care Provider: Clayborn Bigness Other Clinician: Referring Provider: Clayborn Bigness Treating Provider/Extender: Skipper Cliche in Treatment: 19 Active Problems ICD-10 Encounter Code Description Active Date MDM Diagnosis I87.2 Venous insufficiency (chronic) (peripheral) 08/14/2020 No Yes L97.322 Non-pressure chronic ulcer of left ankle with fat layer exposed 08/14/2020 No Yes L97.412 Non-pressure chronic ulcer of right heel and midfoot with fat layer 08/14/2020 No Yes exposed L97.512 Non-pressure chronic ulcer of other part of right foot with fat layer 08/14/2020 No Yes exposed 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 M) Treating RN: Carlene Coria Primary Care Provider: Clayborn Bigness Other Clinician: Referring Provider: Clayborn Bigness Treating Provider/Extender: Skipper Cliche in Treatment: 19 Subjective Chief Complaint Information obtained from Patient Left ankle and right foot and heel ulcers History of Present Illness (HPI) 10/08/18 on evaluation today patient actually presents to our office for initial evaluation concerning wounds that he has of the bilateral lower extremities. 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 He also has bilateral venous stasis. He apparently has been homeless currently following discharge from the hospital apparently he has been placed at [facility] which is is a skilled nursing facility locally. Nonetheless fortunately he does not show any signs of infection at this time which is good news. In fact several of the wound actually appears to be showing some signs of improvement already in my pinion. There are a couple areas in the left leg in particular there likely gonna require some sharp debridement to help clear away some necrotic tissue and help with more sufficient healing. No fevers, chills, nausea, or vomiting noted at this time. 10/15/18 on evaluation today patient actually appears to be doing very well in regard to his bilateral lower extremities. He's been tolerating the dressing changes without complication. Fortunately there does not appear to be any evidence of active infection at this time which is great news. Overall I'm actually very pleased with how this has progressed in just one visits time. Readmission: 08/14/2020 upon evaluation today patient presents for re-evaluation here in our clinic. He is having issues with his left ankle region as well as his right toe and his right heel. He tells me that the toe and heel actually began as a area that was itching that he was scratching and then subsequently opened up into wounds. These may have been abscess areas I presume based on what I am seeing currently. With regard to his left ankle region he tells me this was a similar type occurrence although he does have venous stasis this very well may be more of a venous leg ulcer more than anything. Nonetheless I do believe that the patient would benefit from appropriate and aggressive wound care to try to help get things under better control here. He does have history of a stroke on the left side affecting him to some degree there that he is able to stand  although he does have some residual weakness. Otherwise again the patient does have chronic venous insufficiency as previously noted. His arterial studies most recently obtained showed that he had an ABI on the right of 1.16 with a TBI of 0.52 and on the left and ABI of 1.14 with a TBI of 0.81. That was obtained on 06/19/2020. 08/28/2020 upon evaluation today patient appears to be doing decently well in regard to his wounds in general. He has been tolerating the dressing changes without complication. Fortunately there does not appear to be any signs of active infection which is great news. With that being said I think the [facility] is doing a good job I would recommend that we likely continue with that currently. 09/11/2020 upon evaluation today patient's wounds did not appear to be doing too poorly but again he is not really showing signs of significant improvement with regard to any of the wounds on the right. None of them have Hydrofera Blue on them I am not exactly sure why this is not being followed as the facility did not contact us to let us know of any issues with obtaining dressings or otherwise. With that being said he is supposed to be using Hydrofera Blue on both of the wounds on the right foot as well as the ankle wound on the left side. 09/18/2020 upon evaluation today patient appears to be doing poorly with regard  to his wounds. Again right now the left ankle in particular showed signs of extreme maceration. Apparently he was told by someone with staff at [place] they could not get the [facility]. With that being said this is something that is never been relayed to [place] one way or another. Also the patient subsequently has not supposed to have a border gauze dressing on. He should have an ABD pad and roll gauze to secure as this drains much too much just to have a border gauze dressing to cover. Nonetheless the fact that they are not using the appropriate dressing is  directly causing deterioration of the left ankle wound it is significantly worse today compared to what it was previous. I did attempt to call [facility] while the patient was here I called three times and got no one to even pick up the phone. After this I had my for an office coordinator call and she was able to finally get through and leave a message with the D ON as of dictation of this note which is roughly about an hour and a half later I still have not been able to speak with anyone at the facility. 09/25/2020 upon evaluation today patient actually showing signs of good improvement which is excellent news. He has been tolerating the dressing changes without complication. Fortunately there is no signs of active infection which is great news. No fevers, chills, nausea, vomiting, or diarrhea. I do feel like the facility has been doing a much better job at taking care of him as far as the dressings are concerned. However the director of nursing never did call me back. 10/09/2020 upon evaluation today patient appears to be doing well with regard to his wound. The toe ulcer did require some debridement but the other 2 areas actually appear to be doing quite well. 10/19/2020 upon evaluation today patient actually appears to be doing very well in regard to his wounds. In fact the heel does appear to be completely healed. The toe is doing better in the medial ankle on the left is also doing better. Overall I think he is headed in the right direction. 10/26/2020 upon evaluation today patient appears to be doing well with regard to his wound. He is showing signs of improvement which is great news and overall I am very pleased with where things stand today. No fevers, chills, nausea, vomiting, or diarrhea. 11/02/2020 upon evaluation today patient appears to be doing well with regard to his wounds. He has been tolerating the dressing changes without complication overall I am extremely pleased with where  things stand today. He in regard to the toe is almost completely healed and the medial [facility], Saurav ([id number]) ankle on the left is doing much better. 11/09/2020 upon evaluation today patient appears to be doing a little poorly in regard to his left medial ankle ulcer. Fortunately there does not appear to be any signs of systemic infection but unfortunately locally he does appear to be infected in fact he has blue-green drainage consistent with Pseudomonas. 11/16/2020 upon evaluation today patient appears to be doing well with regard to his wound. It actually appears to be doing better. I did place him on gentamicin cream since the Cipro was actually resistant even though he was positive for Pseudomonas on culture. Overall I think that he does seem to be doing better though I am unsure whether or not they have actually been putting the cream on. The patient is not sure that we did  talk to the nurse directly and she was going to initiate that treatment. Fortunately there does not appear to be any signs of active infection at this time. No fevers, chills, nausea, vomiting, or diarrhea. 4/28; the area on the right second toe is close to healed. Left medial ankle required debridement 12/07/2020 upon evaluation today patient appears to be doing well with regard to his wounds. In fact the right second toe appears to be completely healed which is great news. Fortunately there does not appear to be any signs of active infection at this time which is also great news. I think we can probably discontinue the gentamicin on top of everything else. 12/14/2020 upon evaluation today patient appears to be doing well with regard to his wound. He is making good progress and overall very pleased with where things stand today. There is no signs of active infection at this time which is great news. 12/28/2020 upon evaluation today patient appears to be doing well with regard to his wounds. He has been tolerating the  dressing changes without complication. Fortunately there is no signs of active infection at this time. No fevers, chills, nausea, vomiting, or diarrhea. 12/28/2020 upon evaluation today patient's wound bed actually showed signs of excellent improvement. He has great epithelization and granulation I do not see any signs of infection overall I am extremely pleased with where things stand at this point. No fevers, chills, nausea, vomiting, or diarrhea. Objective Constitutional Well-nourished and well-hydrated in no acute distress. Vitals Time Taken: 8:26 AM, Height: 67 in, Weight: 160 lbs, BMI: 25.1, Temperature: 97.8 F, Pulse: 80 bpm, Respiratory Rate: 18 breaths/min, Blood Pressure: 120/82 mmHg. Respiratory normal breathing without difficulty. Psychiatric this patient is able to make decisions and demonstrates good insight into disease process. Alert and Oriented x 3. pleasant and cooperative. General Notes: Patient's wound bed showed signs of good granulation epithelization again I think that the collagen is doing a great job for him I do not see any evidence of infection and I think at this point my suggestion would be for [place] to continue with such with the current wound care measures. Integumentary (Hair, Skin) Wound #8 status is Open. Original cause of wound was Gradually Appeared. The date acquired was: 07/12/2019. The wound has been in treatment 19 weeks. The wound is located on the Left,Medial Ankle. The wound measures 2.8cm length x 1.9cm width x 0.2cm depth; 4.178cm^2 area and 0.836cm^3 volume. There is Fat Layer (Subcutaneous Tissue) exposed. There is no tunneling or undermining noted. There is a medium amount of serous drainage noted. The wound margin is flat and intact. There is small (1-33%) red, pink granulation within the wound bed. There is a large (67-100%) amount of necrotic tissue within the wound bed including Adherent Slough. 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 WOUND #8: - Ankle Wound Laterality: Left, Medial Cleanser: Normal Saline 1 x Per Day/30 Days Discharge Instructions: Wash your hands with soap and water. Remove old dressing, discard into plastic bag and place into trash. Cleanse the wound with Normal Saline prior to applying a clean dressing using gauze sponges, not tissues or cotton balls. Do not scrub or use  excessive force. Pat dry using gauze sponges, not tissue or cotton balls. Peri-Wound Care: Desitin Maximum Strength Ointment 4 (oz) 1 x Per Day/30 Days Discharge Instructions: Zinc periwound Primary Dressing: Prisma 4.34 (in) 1 x Per Day/30 Days Discharge Instructions: Moisten w/normal saline or sterile water; Cover wound as directed. Do not remove from wound bed. Secondary Dressing: ABD Pad 5x9 (in/in) 1 x Per Day/30 Days Discharge Instructions: Cover with ABD pad Secondary Dressing: Kerlix 4.5 x 4.1 (in/yd) 1 x Per Day/30 Days Discharge Instructions: Apply Kerlix 4.5 x 4.1 (in/yd) as instructed Secured With: 68M Medipore H Soft Cloth Surgical Tape, 2x2 (in/yd) 1 x Per Day/30 Days 1. Would recommend that we continue with the silver collagen as I feel like this is doing a great job for him he is in agreement with that plan. We did discuss the possibility of a skin substitute although with his Medicaid and being in a facility I am not sure this would even be covered. 2. I am also going to recommend that we continue to cover with ABD pad and roll gauze. 3. I am also going to suggest the patient continue to  monitor for any signs of worsening or infection such as increased pain he should let me know if that occurs. We will see patient back for reevaluation in 2 weeks here in the clinic. If anything worsens or changes patient will contact our office for additional recommendations. Electronic Signature(s) Signed: 12/28/2020 10:22:53 AM By: Worthy Keeler PA-C Entered By: Worthy Keeler on 12/28/2020 10:22:52 Chad [name] ([id number]) -------------------------------------------------------------------------------- SuperBill Details Patient Name: Chad [name] Date of Service: 12/28/2020 Medical Record Number: [id number] Patient Account Number: [id number] Date of Birth/Sex: [birth year] (60 y.o. M) Treating RN: Carlene Coria Primary Care Provider: Clayborn Bigness Other Clinician: Referring Provider: Clayborn Bigness Treating Provider/Extender: Skipper Cliche in Treatment: 19 Diagnosis Coding ICD-10 Codes Code Description I87.2 Venous insufficiency (chronic) (peripheral) L97.322 Non-pressure chronic ulcer of left ankle with fat layer exposed L97.412 Non-pressure chronic ulcer of right heel and midfoot with fat layer exposed L97.512 Non-pressure chronic ulcer of other part of right foot with fat layer exposed 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 side Facility Procedures CPT4 Code: 65790383 Description: 99213 - WOUND CARE VISIT-LEV 3 EST PT Modifier: Quantity: 1 Physician Procedures CPT4 Code: 3383291 Description: 99213 - WC PHYS LEVEL 3 - EST PT Modifier: Quantity: 1 CPT4 Code: Description: ICD-10 Diagnosis Description I87.2 Venous insufficiency (chronic) (peripheral) L97.322 Non-pressure chronic ulcer of left ankle with fat layer exposed L97.412 Non-pressure chronic ulcer of right heel and midfoot with fat layer exp L97.512  Non-pressure chronic ulcer of other part of right foot with fat layer e Modifier: osed  xposed Quantity: Electronic Signature(s) Signed: 12/28/2020 10:23:05 AM By: Worthy Keeler PA-C Entered By: Worthy Keeler on 12/28/2020 10:23:04

## 2021-01-01 NOTE — Progress Notes (Addendum)
LIOR, HOEN ([id number]) Visit Report for 12/28/2020 Arrival Information Details Patient Name: Chad [name], Chad [name] Date of Service: 12/28/2020 8:15 AM Medical Record Number: [id number] Patient Account Number: [id number] Date of Birth/Sex: 1960-10-28 (60 y.o. M) Treating RN: Carlene Coria Primary Care Audrie Kuri: Clayborn Bigness Other Clinician: Referring Zeth Buday: Clayborn Bigness Treating Angeliyah Kirkey/Extender: Skipper Cliche in Treatment: 19 Visit Information History Since Last Visit Added or deleted any medications: No Patient Arrived: Wheel Chair Had a fall or experienced change in No Arrival Time: 08:26 activities of daily living that may affect Accompanied By: self risk of falls: Transfer Assistance: None Hospitalized since last visit: No Patient Identification Verified: Yes Pain Present Now: No Secondary Verification Process Completed: Yes Patient Requires Transmission-Based No Precautions: Patient Has Alerts: Yes Patient Alerts: Patient on Blood Thinner ABI right 1.16 ABI left 1.14 Electronic Signature(s) Signed: 12/31/2020 3:36:07 PM By: Jeanine Luz Entered By: Jeanine Luz on 12/28/2020 08:26:19 Chad [name] ([id number]) -------------------------------------------------------------------------------- Clinic Level of Care Assessment Details Patient Name: Chad [name] Date of Service: 12/28/2020 8:15 AM Medical Record Number: [id number] Patient Account Number: [id number] Date of Birth/Sex: 07-26-1961 (60 y.o. M) Treating RN: Carlene Coria Primary Care Guneet Delpino: Clayborn Bigness Other Clinician: Referring Ludger Bones: Clayborn Bigness Treating Phinley Schall/Extender: Skipper Cliche in Treatment: 19 Clinic Level of Care Assessment Items TOOL 4 Quantity Score X - Use when only an EandM is performed on FOLLOW-UP visit 1 0 ASSESSMENTS - Nursing Assessment / Reassessment X - Reassessment of Co-morbidities (includes updates in patient status) 1 10 X- 1 5 Reassessment of Adherence to  Treatment Plan ASSESSMENTS - Wound and Skin Assessment / Reassessment X - Simple Wound Assessment / Reassessment - one wound 1 5 '[]'  - 0 Complex Wound Assessment / Reassessment - multiple wounds '[]'  - 0 Dermatologic / Skin Assessment (not related to wound area) ASSESSMENTS - Focused Assessment '[]'  - Circumferential Edema Measurements - multi extremities 0 '[]'  - 0 Nutritional Assessment / Counseling / Intervention '[]'  - 0 Lower Extremity Assessment (monofilament, tuning fork, pulses) '[]'  - 0 Peripheral Arterial Disease Assessment (using hand held doppler) ASSESSMENTS - Ostomy and/or Continence Assessment and Care '[]'  - Incontinence Assessment and Management 0 '[]'  - 0 Ostomy Care Assessment and Management (repouching, etc.) PROCESS - Coordination of Care X - Simple Patient / Family Education for ongoing care 1 15 '[]'  - 0 Complex (extensive) Patient / Family Education for ongoing care X- 1 10 Staff obtains Programmer, systems, Records, Test Results / Process Orders '[]'  - 0 Staff telephones HHA, Nursing Homes / Clarify orders / etc '[]'  - 0 Routine Transfer to another Facility (non-emergent condition) '[]'  - 0 Routine Hospital Admission (non-emergent condition) '[]'  - 0 New Admissions / Biomedical engineer / Ordering NPWT, Apligraf, etc. '[]'  - 0 Emergency Hospital Admission (emergent condition) X- 1 10 Simple Discharge Coordination '[]'  - 0 Complex (extensive) Discharge Coordination PROCESS - Special Needs '[]'  - Pediatric / Minor Patient Management 0 '[]'  - 0 Isolation Patient Management '[]'  - 0 Hearing / Language / Visual special needs '[]'  - 0 Assessment of Community assistance (transportation, D/C planning, etc.) '[]'  - 0 Additional assistance / Altered mentation '[]'  - 0 Support Surface(s) Assessment (bed, cushion, seat, etc.) INTERVENTIONS - Wound Cleansing / Measurement Chad [name], Chad [name] ([id number]) X- 1 5 Simple Wound Cleansing - one wound '[]'  - 0 Complex Wound Cleansing - multiple wounds X- 1  5 Wound Imaging (photographs - any number of wounds) '[]'  - 0 Wound Tracing (instead of photographs) X- 1 5 Simple Wound Measurement - one wound '[]'  - 0 Complex  Wound Measurement - multiple wounds INTERVENTIONS - Wound Dressings '[]'  - Small Wound Dressing one or multiple wounds 0 X- 1 15 Medium Wound Dressing one or multiple wounds '[]'  - 0 Large Wound Dressing one or multiple wounds X- 1 5 Application of Medications - topical '[]'  - 0 Application of Medications - injection INTERVENTIONS - Miscellaneous '[]'  - External ear exam 0 '[]'  - 0 Specimen Collection (cultures, biopsies, blood, body fluids, etc.) '[]'  - 0 Specimen(s) / Culture(s) sent or taken to Lab for analysis '[]'  - 0 Patient Transfer (multiple staff / Civil Service fast streamer / Similar devices) '[]'  - 0 Simple Staple / Suture removal (25 or less) '[]'  - 0 Complex Staple / Suture removal (26 or more) '[]'  - 0 Hypo / Hyperglycemic Management (close monitor of Blood Glucose) '[]'  - 0 Ankle / Brachial Index (ABI) - do not check if billed separately X- 1 5 Vital Signs Has the patient been seen at the hospital within the last three years: Yes Total Score: 95 Level Of Care: New/Established - Level 3 Electronic Signature(s) Signed: 01/01/2021 8:01:20 AM By: Carlene Coria RN Entered By: Carlene Coria on 12/28/2020 08:47:34 Chad [name] ([id number]) -------------------------------------------------------------------------------- Encounter Discharge Information Details Patient Name: Chad [name] Date of Service: 12/28/2020 8:15 AM Medical Record Number: [id number] Patient Account Number: [id number] Date of Birth/Sex: [birth year] (60 y.o. M) Treating RN: Donnamarie Poag Primary Care Sheyenne Konz: Clayborn Bigness Other Clinician: Referring Nohely Whitehorn: Clayborn Bigness Treating Montrice Montuori/Extender: Skipper Cliche in Treatment: 19 Encounter Discharge Information Items Discharge Condition: Stable Ambulatory Status: Wheelchair Discharge Destination: Skilled Nursing  Facility Telephoned: No Orders Sent: Yes Transportation: Other Accompanied By: self Schedule Follow-up Appointment: Yes Clinical Summary of Care: Electronic Signature(s) Signed: 12/28/2020 12:04:49 PM By: Donnamarie Poag Entered By: Donnamarie Poag on 12/28/2020 09:02:35 Chad [name] ([id number]) -------------------------------------------------------------------------------- Lower Extremity Assessment Details Patient Name: Chad [name] Date of Service: 12/28/2020 8:15 AM Medical Record Number: [id number] Patient Account Number: [id number] Date of Birth/Sex: 06-06-1961 (60 y.o. M) Treating RN: Carlene Coria Primary Care Demetra Moya: Clayborn Bigness Other Clinician: Referring Yumalay Circle: Clayborn Bigness Treating Terina Mcelhinny/Extender: Jeri Cos Weeks in Treatment: 19 Vascular Assessment Pulses: Dorsalis Pedis Palpable: [Left:Yes] Electronic Signature(s) Signed: 12/31/2020 3:36:07 PM By: Jeanine Luz Signed: 01/01/2021 8:01:20 AM By: Carlene Coria RN Entered By: Jeanine Luz on 12/28/2020 08:33:03 Chad [name] ([id number]) -------------------------------------------------------------------------------- Multi Wound Chart Details Patient Name: Chad [name] Date of Service: 12/28/2020 8:15 AM Medical Record Number: [id number] Patient Account Number: [id number] Date of Birth/Sex: [birth year] (60 y.o. M) Treating RN: Carlene Coria Primary Care Leanora Murin: Clayborn Bigness Other Clinician: Referring Chai Routh: Clayborn Bigness Treating Calyn Sivils/Extender: Skipper Cliche in Treatment: 19 Vital Signs Height(in): 43 Pulse(bpm): 80 Weight(lbs): 160 Blood Pressure(mmHg): 120/82 Body Mass Index(BMI): 25 Temperature(F): 97.8 Respiratory Rate(breaths/min): 18 Photos: [N/A:N/A] Wound Location: Left, Medial Ankle N/A N/A Wounding Event: Gradually Appeared N/A N/A Primary Etiology: Venous Leg Ulcer N/A N/A Comorbid History: Anemia, Chronic Obstructive N/A N/A Pulmonary Disease (COPD), Hepatitis C,  Received Chemotherapy Date Acquired: 07/12/2019 N/A N/A Weeks of Treatment: 19 N/A N/A Wound Status: Open N/A N/A Measurements L x W x D (cm) 2.8x1.9x0.2 N/A N/A Area (cm) : 4.178 N/A N/A Volume (cm) : 0.836 N/A N/A % Reduction in Area: 29.10% N/A N/A % Reduction in Volume: -41.90% N/A N/A Classification: Full Thickness Without Exposed N/A N/A Support Structures Exudate Amount: Medium N/A N/A Exudate Type: Serous N/A N/A Exudate Color: amber N/A N/A Wound Margin: Flat and Intact N/A N/A Granulation Amount: Small (1-33%) N/A N/A Granulation Quality: Red, Pink N/A N/A Necrotic Amount: Large (  67-100%) N/A N/A Exposed Structures: Fat Layer (Subcutaneous Tissue): N/A N/A Yes Fascia: No Tendon: No Muscle: No Joint: No Bone: No Epithelialization: Small (1-33%) N/A N/A Treatment Notes Electronic Signature(s) Signed: 01/01/2021 8:01:20 AM By: Carlene Coria RN Entered By: Carlene Coria on 12/28/2020 08:45:47 Chad [name] ([id number]) -------------------------------------------------------------------------------- Multi-Disciplinary Care Plan Details Patient Name: Chad [name] Date of Service: 12/28/2020 8:15 AM Medical Record Number: [id number] Patient Account Number: [id number] Date of Birth/Sex: 03/01/1961 (60 y.o. M) Treating RN: Carlene Coria Primary Care Shivon Hackel: Clayborn Bigness Other Clinician: Referring Ivry Pigue: Clayborn Bigness Treating Evi Mccomb/Extender: Skipper Cliche in Treatment: 19 Active Inactive Wound/Skin Impairment Nursing Diagnoses: Knowledge deficit related to ulceration/compromised skin integrity Goals: Patient/caregiver will verbalize understanding of skin care regimen Date Initiated: 08/14/2020 Date Inactivated: 10/09/2020 Target Resolution Date: 09/14/2020 Goal Status: Met Ulcer/skin breakdown will have a volume reduction of 30% by week 4 Date Initiated: 08/14/2020 Date Inactivated: 10/19/2020 Target Resolution Date: 09/14/2020 Goal Status: Met Ulcer/skin  breakdown will heal within 14 weeks Date Initiated: 12/07/2020 Target Resolution Date: 01/23/2021 Goal Status: Active Interventions: Assess patient/caregiver ability to obtain necessary supplies Assess patient/caregiver ability to perform ulcer/skin care regimen upon admission and as needed Assess ulceration(s) every visit Notes: Electronic Signature(s) Signed: 01/01/2021 8:01:20 AM By: Carlene Coria RN Entered By: Carlene Coria on 12/28/2020 08:45:38 Chad [name] ([id number]) -------------------------------------------------------------------------------- Pain Assessment Details Patient Name: Chad [name] Date of Service: 12/28/2020 8:15 AM Medical Record Number: [id number] Patient Account Number: [id number] Date of Birth/Sex: July 29, 1960 (60 y.o. M) Treating RN: Carlene Coria Primary Care Khristie Sak: Clayborn Bigness Other Clinician: Referring Terra Aveni: Clayborn Bigness Treating Sophiarose Eades/Extender: Skipper Cliche in Treatment: 19 Active Problems Location of Pain Severity and Description of Pain Patient Has Paino No Site Locations Rate the pain. Current Pain Level: 0 Pain Management and Medication Current Pain Management: Electronic Signature(s) Signed: 12/31/2020 3:36:07 PM By: Jeanine Luz Signed: 01/01/2021 8:01:20 AM By: Carlene Coria RN Entered By: Jeanine Luz on 12/28/2020 08:28:19 Chad [name] ([id number]) -------------------------------------------------------------------------------- Patient/Caregiver Education Details Patient Name: Chad [name] Date of Service: 12/28/2020 8:15 AM Medical Record Number: [id number] Patient Account Number: [id number] Date of Birth/Gender: Nov 26, 1960 (60 y.o. M) Treating RN: Carlene Coria Primary Care Physician: Clayborn Bigness Other Clinician: Referring Physician: Clayborn Bigness Treating Physician/Extender: Skipper Cliche in Treatment: 19 Education Assessment Education Provided To: Patient Education Topics Provided Wound/Skin  Impairment: Methods: Explain/Verbal Responses: State content correctly Electronic Signature(s) Signed: 01/01/2021 8:01:20 AM By: Carlene Coria RN Entered By: Carlene Coria on 12/28/2020 08:47:51 Chad [name] ([id number]) -------------------------------------------------------------------------------- Wound Assessment Details Patient Name: Chad [name] Date of Service: 12/28/2020 8:15 AM Medical Record Number: [id number] Patient Account Number: [id number] Date of Birth/Sex: 05-29-1961 (60 y.o. M) Treating RN: Carlene Coria Primary Care Tyjay Galindo: Clayborn Bigness Other Clinician: Referring Remmy Riffe: Clayborn Bigness Treating Amarian Botero/Extender: Skipper Cliche in Treatment: 19 Wound Status Wound Number: 8 Primary Venous Leg Ulcer Etiology: Wound Location: Left, Medial Ankle Wound Open Wounding Event: Gradually Appeared Status: Date Acquired: 07/12/2019 Comorbid Anemia, Chronic Obstructive Pulmonary Disease (COPD), Weeks Of Treatment: 19 History: Hepatitis C, Received Chemotherapy Clustered Wound: No Photos Wound Measurements Length: (cm) 2.8 Width: (cm) 1.9 Depth: (cm) 0.2 Area: (cm) 4.178 Volume: (cm) 0.836 % Reduction in Area: 29.1% % Reduction in Volume: -41.9% Epithelialization: Small (1-33%) Tunneling: No Undermining: No Wound Description Classification: Full Thickness Without Exposed Support Structures Wound Margin: Flat and Intact Exudate Amount: Medium Exudate Type: Serous Exudate Color: amber Foul Odor After Cleansing: No Slough/Fibrino Yes Wound Bed Granulation Amount: Small (1-33%) Exposed Structure Granulation Quality: Red, Pink Fascia Exposed:  No Necrotic Amount: Large (67-100%) Fat Layer (Subcutaneous Tissue) Exposed: Yes Necrotic Quality: Adherent Slough Tendon Exposed: No Muscle Exposed: No Joint Exposed: No Bone Exposed: No Treatment Notes Wound #8 (Ankle) Wound Laterality: Left, Medial Cleanser Normal Saline Discharge Instruction: Wash your hands  with soap and water. Remove old dressing, discard into plastic bag and place into trash. Cleanse the wound with Normal Saline prior to applying a clean dressing using gauze sponges, not tissues or cotton balls. Do not scrub or use excessive force. Pat dry using gauze sponges, not tissue or cotton balls. Chad [name], Chad [name] ([id number]) Peri-Wound Care Desitin Maximum Strength Ointment 4 (oz) Discharge Instruction: Zinc periwound Topical Primary Dressing Prisma 4.34 (in) Discharge Instruction: Moisten w/normal saline or sterile water; Cover wound as directed. Do not remove from wound bed. Secondary Dressing ABD Pad 5x9 (in/in) Discharge Instruction: Cover with ABD pad Kerlix 4.5 x 4.1 (in/yd) Discharge Instruction: Apply Kerlix 4.5 x 4.1 (in/yd) as instructed Secured With 100M [place] Surgical Tape, 2x2 (in/yd) Compression Wrap Compression Stockings Add-Ons Electronic Signature(s) Signed: 12/31/2020 3:36:07 PM By: Jeanine Luz Signed: 01/01/2021 8:01:20 AM By: Carlene Coria RN Entered By: Jeanine Luz on 12/28/2020 08:32:27 Chad [name] ([id number]) -------------------------------------------------------------------------------- Vitals Details Patient Name: Chad [name] Date of Service: 12/28/2020 8:15 AM Medical Record Number: [id number] Patient Account Number: [id number] Date of Birth/Sex: 09/12/1960 (60 y.o. M) Treating RN: Carlene Coria Primary Care Amariz Flamenco: Clayborn Bigness Other Clinician: Referring Lonya Johannesen: Clayborn Bigness Treating Osmel Dykstra/Extender: Skipper Cliche in Treatment: 19 Vital Signs Time Taken: 08:26 Temperature (F): 97.8 Height (in): 67 Pulse (bpm): 80 Weight (lbs): 160 Respiratory Rate (breaths/min): 18 Body Mass Index (BMI): 25.1 Blood Pressure (mmHg): 120/82 Reference Range: 80 - 120 mg / dl Electronic Signature(s) Signed: 12/31/2020 3:36:07 PM By: Jeanine Luz Entered By: Jeanine Luz on 12/28/2020 08:27:57

## 2021-01-07 ENCOUNTER — Encounter: Payer: Self-pay | Admitting: Internal Medicine

## 2021-01-07 ENCOUNTER — Inpatient Hospital Stay (HOSPITAL_BASED_OUTPATIENT_CLINIC_OR_DEPARTMENT_OTHER): Payer: Medicaid Other | Admitting: Internal Medicine

## 2021-01-07 ENCOUNTER — Other Ambulatory Visit: Payer: Self-pay

## 2021-01-07 ENCOUNTER — Inpatient Hospital Stay: Payer: Medicaid Other | Attending: Internal Medicine

## 2021-01-07 ENCOUNTER — Inpatient Hospital Stay: Payer: Medicaid Other

## 2021-01-07 VITALS — HR 66

## 2021-01-07 DIAGNOSIS — C182 Malignant neoplasm of ascending colon: Secondary | ICD-10-CM

## 2021-01-07 DIAGNOSIS — C661 Malignant neoplasm of right ureter: Secondary | ICD-10-CM

## 2021-01-07 DIAGNOSIS — Z5112 Encounter for antineoplastic immunotherapy: Secondary | ICD-10-CM | POA: Insufficient documentation

## 2021-01-07 DIAGNOSIS — Z1509 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: Secondary | ICD-10-CM

## 2021-01-07 DIAGNOSIS — Z515 Encounter for palliative care: Secondary | ICD-10-CM

## 2021-01-07 DIAGNOSIS — D509 Iron deficiency anemia, unspecified: Secondary | ICD-10-CM | POA: Diagnosis not present

## 2021-01-07 LAB — COMPREHENSIVE METABOLIC PANEL
ALT: 19 U/L (ref 0–44)
AST: 32 U/L (ref 15–41)
Albumin: 3.7 g/dL (ref 3.5–5.0)
Alkaline Phosphatase: 94 U/L (ref 38–126)
Anion gap: 7 (ref 5–15)
BUN: 19 mg/dL (ref 8–23)
CO2: 27 mmol/L (ref 22–32)
Calcium: 8.5 mg/dL — ABNORMAL LOW (ref 8.9–10.3)
Chloride: 101 mmol/L (ref 98–111)
Creatinine, Ser: 0.82 mg/dL (ref 0.61–1.24)
GFR, Estimated: >60 mL/min (ref >60)
Glucose, Bld: 87 mg/dL (ref 70–99)
Potassium: 4.3 mmol/L (ref 3.5–5.1)
Sodium: 135 mmol/L (ref 135–145)
Total Bilirubin: 0.4 mg/dL (ref 0.3–1.2)
Total Protein: 7.6 g/dL (ref 6.5–8.1)

## 2021-01-07 LAB — CBC WITH DIFFERENTIAL/PLATELET
Abs Immature Granulocytes: 0.02 10*3/uL (ref 0.00–0.07)
Basophils Absolute: 0.1 10*3/uL (ref 0.0–0.1)
Basophils Relative: 1 %
Eosinophils Absolute: 0.3 10*3/uL (ref 0.0–0.5)
Eosinophils Relative: 5 %
HCT: 38.7 % — ABNORMAL LOW (ref 39.0–52.0)
Hemoglobin: 12.4 g/dL — ABNORMAL LOW (ref 13.0–17.0)
Immature Granulocytes: 0 %
Lymphocytes Relative: 21 %
Lymphs Abs: 1.3 10*3/uL (ref 0.7–4.0)
MCH: 25.6 pg — ABNORMAL LOW (ref 26.0–34.0)
MCHC: 32 g/dL (ref 30.0–36.0)
MCV: 80 fL (ref 80.0–100.0)
Monocytes Absolute: 0.5 10*3/uL (ref 0.1–1.0)
Monocytes Relative: 8 %
Neutro Abs: 3.9 10*3/uL (ref 1.7–7.7)
Neutrophils Relative %: 65 %
Platelets: 176 10*3/uL (ref 150–400)
RBC: 4.84 MIL/uL (ref 4.22–5.81)
RDW: 17.6 % — ABNORMAL HIGH (ref 11.5–15.5)
WBC: 6.1 10*3/uL (ref 4.0–10.5)
nRBC: 0 % (ref 0.0–0.2)

## 2021-01-07 MED ORDER — HEPARIN SOD (PORK) LOCK FLUSH 100 UNIT/ML IV SOLN
INTRAVENOUS | Status: AC
  Filled 2021-01-07: qty 5

## 2021-01-07 MED ORDER — SODIUM CHLORIDE 0.9 % IV SOLN
Freq: Once | INTRAVENOUS | Status: AC
  Filled 2021-01-07: qty 250

## 2021-01-07 MED ORDER — SODIUM CHLORIDE 0.9 % IV SOLN
200.0000 mg | Freq: Once | INTRAVENOUS | Status: AC
  Administered 2021-01-07: 200 mg via INTRAVENOUS
  Filled 2021-01-07: qty 8

## 2021-01-07 MED ORDER — SODIUM CHLORIDE 0.9% FLUSH
10.0000 mL | INTRAVENOUS | Status: DC | PRN
  Administered 2021-01-07: 10 mL via INTRAVENOUS
  Filled 2021-01-07: qty 10

## 2021-01-07 MED ORDER — HEPARIN SOD (PORK) LOCK FLUSH 100 UNIT/ML IV SOLN
500.0000 [IU] | Freq: Once | INTRAVENOUS | Status: AC
  Administered 2021-01-07: 500 [IU] via INTRAVENOUS
  Filled 2021-01-07: qty 5

## 2021-01-07 NOTE — Progress Notes (Signed)
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  Is alone.  Chad [name] 60 y.o.  male above history of stage IV-ureteral cancer/history of stage III colon cancer right side; recurrent sigmoid colon cancer [un-resected/under surveillance] and multiple other comorbidities currently on Keytruda is here for follow-up.  Patient denies any blood in stools or black or stools.  Denies abdominal pain.  No worsening constipation or diarrhea.   Review of Systems  Constitutional:  Positive for malaise/fatigue. Negative for chills, diaphoresis, fever and weight loss.  HENT:  Negative for  nosebleeds and sore throat.   Eyes:  Negative for double vision.  Respiratory:  Positive for cough and shortness of breath. Negative for hemoptysis and wheezing.   Cardiovascular:  Negative for chest pain, palpitations and orthopnea.  Gastrointestinal:  Positive for constipation. Negative for abdominal pain, blood in stool, diarrhea, heartburn, melena, nausea and vomiting.  Genitourinary:  Negative for dysuria, frequency and urgency.  Musculoskeletal:  Positive for back pain and joint pain.  Skin: Negative.  Negative for itching and rash.  Neurological:  Positive for focal weakness. Negative for dizziness, tingling, weakness and headaches.       Chronic left-sided weakness upper than lower extremity.  Endo/Heme/Allergies:  Does not bruise/bleed easily.  Psychiatric/Behavioral:  Negative for depression. The patient is not nervous/anxious and does not have insomnia.     MEDICAL HISTORY:  Past Medical History:  Diagnosis Date  . Anemia   . Anxiety   . ARF (acute respiratory failure) ([place])   . Bladder cancer ([place])   . COPD (chronic obstructive pulmonary disease) ([place])   . Depression   . Dysphagia   . Family history of colon cancer   . Family history of kidney cancer   . Family history of leukemia   . Family history of prostate cancer   . GERD (gastroesophageal reflux disease)   . Hepatitis    chronic hep c  . Hydronephrosis   . Hydronephrosis with ureteral stricture   . Hyperlipidemia   . Knee pain    Left  . Malignant neoplasm of colon ([place])   . Nerve pain   . Peripheral vascular disease ([place])   . Prostate cancer ([place])   . Stroke ([place])   .  Urinary frequency   . Venous hypertension of both lower extremities     SURGICAL HISTORY: Past Surgical History:  Procedure Laterality Date  . COLON SURGERY     En bloc extended right hemicolectomy 07/2017  . COLONOSCOPY WITH PROPOFOL N/A 11/06/2020   Procedure: COLONOSCOPY WITH PROPOFOL;  Surgeon: Jonathon Bellows, MD;  [facility]  ENDOSCOPY;  Service: Gastroenterology;  Laterality: N/A;  . CYSTOSCOPY W/ RETROGRADES Right 08/30/2018   Procedure: CYSTOSCOPY WITH RETROGRADE PYELOGRAM;  Surgeon: Hollice Espy, MD;  Location: [facility] ORS;  Service: Urology;  Laterality: Right;  . CYSTOSCOPY WITH STENT PLACEMENT Right 04/25/2018   Procedure: CYSTOSCOPY WITH STENT PLACEMENT;  Surgeon: Hollice Espy, MD;  Location: [facility] ORS;  Service: Urology;  Laterality: Right;  . CYSTOSCOPY WITH STENT PLACEMENT Right 08/30/2018   Procedure: [place] WITH STENT Exchange;  Surgeon: Hollice Espy, MD;  Location: [facility] ORS;  Service: Urology;  Laterality: Right;  . CYSTOSCOPY WITH STENT PLACEMENT Right 03/07/2019   Procedure: CYSTOSCOPY WITH STENT Exchange;  Surgeon: Hollice Espy, MD;  Location: [facility] ORS;  Service: Urology;  Laterality: Right;  . CYSTOSCOPY WITH STENT PLACEMENT Right 11/21/2019   Procedure: CYSTOSCOPY WITH STENT Exchange;  Surgeon: Hollice Espy, MD;  Location: [facility] ORS;  Service: Urology;  Laterality: Right;  . LOWER EXTREMITY ANGIOGRAPHY Left 05/23/2019   Procedure: LOWER EXTREMITY ANGIOGRAPHY;  Surgeon: Algernon Huxley, MD;  Location: [place] [facility];  Service: Cardiovascular;  Laterality: Left;  . LOWER EXTREMITY ANGIOGRAPHY Right 05/30/2019   Procedure: LOWER EXTREMITY ANGIOGRAPHY;  Surgeon: Algernon Huxley, MD;  Location: [place] [facility];  Service: Cardiovascular;  Laterality: Right;  . LOWER EXTREMITY ANGIOGRAPHY Right 02/13/2020   Procedure: LOWER EXTREMITY ANGIOGRAPHY;  Surgeon: Algernon Huxley, MD;  Location: [place] [facility];  Service: Cardiovascular;  Laterality: Right;  . LOWER EXTREMITY ANGIOGRAPHY Left 02/20/2020   Procedure: LOWER EXTREMITY ANGIOGRAPHY;  Surgeon: Algernon Huxley, MD;  Location: [place] [facility];  Service: Cardiovascular;  Laterality: Left;  . PORTA CATH INSERTION N/A 02/28/2019   Procedure: PORTA CATH INSERTION;  Surgeon: Algernon Huxley, MD;  Location: [place] [facility];  Service:  Cardiovascular;  Laterality: N/A;  . tumor removed       SOCIAL HISTORY: Social History   Socioeconomic History  . Marital status: Single    Spouse name: Not on file  . Number of children: Not on file  . Years of education: Not on file  . Highest education level: Not on file  Occupational History  . Not on file  Tobacco Use  . Smoking status: Every Day    Packs/day: 1.00    Pack years: 0.00    Types: Cigarettes  . Smokeless tobacco: Never  Vaping Use  . Vaping Use: Never used  Substance and Sexual Activity  . Alcohol use: Not Currently  . Drug use: Not Currently  . Sexual activity: Not Currently  Other Topics Concern  . Not on file  Social History Narrative    used to live [place]; moved  To [place]- end of April 2019; in Nursing home; 1pp/day; quit alcohol. 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        Family- dad- prostate ca [at 60y]; brother- 42 died of prostate cancer; brother- 73- no cancers [[place]]; sonGerald Stabs [[place]];[name]-32y prostate ca [facility] [place]]; daughter- 27 [[place]]; another daughter 60 [[place]/addict]. will refer genetics counseling. Given MSI- abnormal; highly suspicious of Lynch syndrome.  Patient's son [name] aware of high possible lynch syndrome.  Social Determinants of Health   Financial Resource Strain: Not on file  Food Insecurity: Not on file  Transportation Needs: Not on file  Physical Activity: Not on file  Stress: Not on file  Social Connections: Not on file  Intimate Partner Violence: Not on file    FAMILY HISTORY: Family History  Problem Relation Age of Onset  . Prostate cancer Father 45  . Cancer Brother 100       unsure type  . Cancer Paternal Uncle        unsure type  . Cancer Maternal Grandmother        unsure type  . Cancer Paternal Grandmother        unsure type  . Kidney cancer Paternal Grandfather   . Cancer Other        unsure types  . Leukemia Son   . Cancer Son        other cancers, possibly colon    ALLERGIES:  is allergic to  penicillins.  MEDICATIONS:  Current Outpatient Medications  Medication Sig Dispense Refill  . acetaminophen (TYLENOL) 325 MG tablet Take 650 mg by mouth 3 (three) times daily.     Marland Kitchen aspirin EC 81 MG tablet Take 1 tablet (81 mg total) by mouth daily. 150 tablet 2  . Cholecalciferol (VITAMIN D) 50 MCG (2000 UT) CAPS Take by mouth.    . clopidogrel (PLAVIX) 75 MG tablet Take 1 tablet (75 mg total) by mouth daily. 30 tablet 11  . cyclobenzaprine (FLEXERIL) 10 MG tablet Take 10 mg by mouth at bedtime.    Marland Kitchen escitalopram (LEXAPRO) 5 MG tablet Take 5 mg by mouth daily.    Marland Kitchen gabapentin (NEURONTIN) 100 MG capsule Take 100 mg by mouth 3 (three) times daily.    . hydroxypropyl methylcellulose / hypromellose (ISOPTO TEARS / GONIOVISC) 2.5 % ophthalmic solution Place 1 drop into both eyes every 12 (twelve) hours.    . mirtazapine (REMERON) 7.5 MG tablet Take 7.5 mg by mouth at bedtime.    . Oxycodone HCl 10 MG TABS Take 10 mg by mouth every 6 (six) hours as needed for pain.    . pantoprazole (PROTONIX) 40 MG tablet Take 40 mg by mouth daily.    . Pollen Extracts (PROSTAT PO) Take 30 mLs by mouth.    . polyvinyl alcohol (LIQUIFILM TEARS) 1.4 % ophthalmic solution 1 gtt both eyes every 12 hours    . pravastatin (PRAVACHOL) 10 MG tablet Take 10 mg by mouth daily.    . RESTORE CALCIUM ALGINATE EX Apply topically. Note: Dressing located on left ankle. Facility cleaning wound "every day shift and applying wound cleanser and appication of calcium Alginate and cover with dry dressing and wrapped with Kerlix"    . atorvastatin (LIPITOR) 10 MG tablet Take 1 tablet (10 mg total) by mouth daily. 30 tablet 11   No current facility-administered medications for this visit.      Marland Kitchen  PHYSICAL EXAMINATION: ECOG PERFORMANCE STATUS: 1 - Symptomatic but completely ambulatory  Vitals:   01/07/21 0850  BP: 120/84  Pulse: (!) 102  Resp: 16  Temp: (!) 96.5 F (35.8 C)  SpO2: 95%   Filed Weights   01/07/21 0850   Weight: 160 lb (72.6 kg)    Physical Exam Constitutional:      Comments: In wheelchair. Cachectic.  Appears older than his stated age.  Alone.  HENT:     Head: Normocephalic and atraumatic.     Mouth/Throat:  Pharynx: No oropharyngeal exudate.  Eyes:     Pupils: Pupils are equal, round, and reactive to light.  Cardiovascular:     Rate and Rhythm: Normal rate and regular rhythm.  Pulmonary:     Effort: No respiratory distress.     Breath sounds: No wheezing.  Abdominal:     General: Bowel sounds are normal. There is no distension.     Palpations: Abdomen is soft. There is no mass.     Tenderness: no abdominal tenderness There is no guarding or rebound.  Musculoskeletal:        General: No tenderness. Normal range of motion.     Cervical back: Normal range of motion and neck supple.  Skin:    General: Skin is warm.     Comments: Bilateral lower extremity ulcerations noted.  Pulses intact bilaterally.  Neurological:     Mental Status: He is oriented to person, place, and time.     Comments: Sleepy but easily arousable.  Chronic weakness left upper and lower extremity.  Psychiatric:        Mood and Affect: Affect normal.     LABORATORY DATA:  I have reviewed the data as listed Lab Results  Component Value Date   WBC 6.1 01/07/2021   HGB 12.4 (L) 01/07/2021   HCT 38.7 (L) 01/07/2021   MCV 80.0 01/07/2021   PLT 176 01/07/2021   Recent Labs    03/08/20 0905 03/29/20 0824 04/19/20 0858 05/10/20 0915 11/19/20 0850 12/17/20 0858 01/07/21 0823  NA 137 139 134*   < > 136 136 135  K 4.4 4.1 3.8   < > 4.1 4.0 4.3  CL 103 104 101   < > 101 102 101  CO2 _0 < > _1 GLUCOSE 88 94 140*   < > 89 82 87  BUN _2 < > 23 25* 19  CREATININE 0.85 0.76 0.92   < > 0.93 0.96 0.82  CALCIUM 8.8* 8.6* 8.4*   < > 8.8* 8.6* 8.5*  GFRNONAA >60 >60 >60   < > >60 >60 >60  GFRAA >60 >60 >60  --   --   --   --   PROT 8.0 7.7 7.8   < > 7.9 7.8 7.6  ALBUMIN 3.8 3.6  3.7   < > 3.8 3.8 3.7  AST 32 28 33   < > 37 30 32  ALT _3 < > _4 ALKPHOS 127* 118 115   < > 96 100 94  BILITOT 0.6 0.4 0.4   < > 0.7 0.6 0.4   < > = values in this interval not displayed.    RADIOGRAPHIC STUDIES: I have personally reviewed the radiological images as listed and agreed with the findings in the report. No results found.  ASSESSMENT & PLAN:   Ureteral cancer, right ([place]) # High-grade urothelial cancer/cytology; likely of the right renal pelvis /upper ureter. JAN 5th, 2022- CT findings worrisome for progressive right renal pelvis and ureteral tumor and mass suspicious for colonic neoplasm involving the descending colon sigmoid colon junction region [see below].  Mild soft tissue thickening around the distal right ureter could reflect ureteral tumor; FEB 2022-  RIGHT renal pelvis and bladder are not evaluable by FDG PET imaging due to high activity of radiotracer within the urine. STABLE; ; but see below re: sigmoid mass/colon cancer  # proceed with  [place] today; Labs  today reviewed; Labs today reviewed;  acceptable for treatment today.   #Descending colon mass- PET scan- FEB 2022-uptake in the cecum;  [history of Lynch syndrome]-colonoscopy 25 mm polyp; biopsy mucinous carcinoma- given risk vs benefits -hold surgery for now; re-eval with colonoscopy in October.   # Iron deficiency anemia-hemoglobin 11.5; February 2022 ron studies/ferritin-LOW; hold Venofer with infusions;  STABLE.   # Bilateral LE ulcers-s/p wound care evaluation; s/p PTCA with Dr.Dew. bil Arterial Dopp-wnl; stable.  #DISPOSITION: #  Keytruda; HOLD venofer today;  # follow up in 3 weeks-;MD labs-cbc/cmp;  Keytruda; - Dr.B.   All questions were answered. The patient knows to call the clinic with any problems, questions or concerns.     R , MD 01/08/2021 9:30 AM    

## 2021-01-07 NOTE — Assessment & Plan Note (Addendum)
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 JAN 5th, 2022- CT findings worrisome for progressive right renal pelvis and ureteral tumor and mass suspicious for colonic neoplasm involving the descending colon sigmoid colon junction region [see below].  Mild soft tissue thickening around the distal right ureter could reflect ureteral tumor; FEB 2022-  RIGHT renal pelvis and bladder are not evaluable by FDG PET imaging due to high activity of radiotracer within the urine. STABLE; ; but see below re: sigmoid mass/colon cancer  # proceed with  [place] today; Labs today reviewed; Labs today reviewed;  acceptable for treatment today.   #Descending colon mass- PET scan- FEB 2022-uptake in the cecum;  [history of Lynch syndrome]-colonoscopy 25 mm polyp; biopsy mucinous carcinoma- given risk vs benefits -hold surgery for now; re-eval with colonoscopy in October.   # Iron deficiency anemia-hemoglobin 11.5; February 2022 ron studies/ferritin-LOW; hold Venofer with infusions;  STABLE.   # Bilateral LE ulcers-s/p wound care evaluation; s/p PTCA with Dr.Dew. bil Arterial Dopp-wnl; stable.  #DISPOSITION: #  Keytruda; HOLD venofer today;  # follow up in 3 weeks-;MD labs-cbc/cmp;  Keytruda; - Dr.B.

## 2021-01-07 NOTE — Patient Instructions (Addendum)
[facility]  Discharge Instructions: Thank you for choosing [facility] to provide your oncology and hematology care.  If you have a lab appointment with the Cancer Center, please go directly to the Cancer Center and check in at the registration area.  Wear comfortable clothing and clothing appropriate for easy access to any Portacath or PICC line.   We strive to give you quality time with your provider. You may need to reschedule your appointment if you arrive late (15 or more minutes).  Arriving late affects you and other patients whose appointments are after yours.  Also, if you miss three or more appointments without notifying the office, you may be dismissed from the clinic at the provider's discretion.      For prescription refill requests, have your pharmacy contact our office and allow 72 hours for refills to be completed.    Today you received the following chemotherapy and/or immunotherapy agents keytruda      To help prevent nausea and vomiting after your treatment, we encourage you to take your nausea medication as directed.  BELOW ARE SYMPTOMS THAT SHOULD BE REPORTED IMMEDIATELY: *FEVER GREATER THAN 100.4 F (38 C) OR HIGHER *CHILLS OR SWEATING *NAUSEA AND VOMITING THAT IS NOT CONTROLLED WITH YOUR NAUSEA MEDICATION *UNUSUAL SHORTNESS OF BREATH *UNUSUAL BRUISING OR BLEEDING *URINARY PROBLEMS (pain or burning when urinating, or frequent urination) *BOWEL PROBLEMS (unusual diarrhea, constipation, pain near the anus) TENDERNESS IN MOUTH AND THROAT WITH OR WITHOUT PRESENCE OF ULCERS (sore throat, sores in mouth, or a toothache) UNUSUAL RASH, SWELLING OR PAIN  UNUSUAL VAGINAL DISCHARGE OR ITCHING   Items with * indicate a potential emergency and should be followed up as soon as possible or go to the Emergency Department if any problems should occur.  Please show the CHEMOTHERAPY ALERT CARD or IMMUNOTHERAPY ALERT CARD at check-in to  the Emergency Department and triage nurse.  Should you have questions after your visit or need to cancel or reschedule your appointment, please contact [facility]  [phone] and follow the prompts.  Office hours are 8:00 a.m. to 4:30 p.m. Monday - Friday. Please note that voicemails left after 4:00 p.m. may not be returned until the following business day.  We are closed weekends and major holidays. You have access to a nurse at all times for urgent questions. Please call the main number to the clinic [phone] and follow the prompts.  For any non-urgent questions, you may also contact your provider using MyChart. We now offer e-Visits for anyone 18 and older to request care online for non-urgent symptoms. For details visit [email or web address].   Also download the MyChart app! Go to the app store, search "MyChart", open the app, select [place] [facility], and log in with your MyChart username and password.  Due to Covid, a mask is required upon entering the hospital/clinic. If you do not have a mask, one will be given to you upon arrival. For doctor visits, patients may have 1 support person aged 18 or older with them. For treatment visits, patients cannot have anyone with them due to current Covid guidelines and our immunocompromised population.   Pembrolizumab injection What is this medication? PEMBROLIZUMAB (pem broe liz ue mab) is a monoclonal antibody. It is used totreat certain types of cancer. This medicine may be used for other purposes; ask your health care provider orpharmacist if you have questions. COMMON BRAND NAME(S): Keytruda What should I tell my care team before I   take this medication? They need to know if you have any of these conditions: autoimmune diseases like Crohn's disease, ulcerative colitis, or lupus have had or planning to have an allogeneic stem cell transplant (uses someone else's stem cells) history of organ transplant history  of chest radiation nervous system problems like myasthenia gravis or Guillain-Barre syndrome an unusual or allergic reaction to pembrolizumab, other medicines, foods, dyes, or preservatives pregnant or trying to get pregnant breast-feeding How should I use this medication? This medicine is for infusion into a vein. It is given by a health careprofessional in a hospital or clinic setting. A special MedGuide will be given to you before each treatment. Be sure to readthis information carefully each time. Talk to your pediatrician regarding the use of this medicine in children. While this drug may be prescribed for children as young as 6 months for selectedconditions, precautions do apply. Overdosage: If you think you have taken too much of this medicine contact apoison control center or emergency room at once. NOTE: This medicine is only for you. Do not share this medicine with others. What if I miss a dose? It is important not to miss your dose. Call your doctor or health careprofessional if you are unable to keep an appointment. What may interact with this medication? Interactions have not been studied. This list may not describe all possible interactions. Give your health care provider a list of all the medicines, herbs, non-prescription drugs, or dietary supplements you use. Also tell them if you smoke, drink alcohol, or use illegaldrugs. Some items may interact with your medicine. What should I watch for while using this medication? Your condition will be monitored carefully while you are receiving thismedicine. You may need blood work done while you are taking this medicine. Do not become pregnant while taking this medicine or for 4 months after stopping it. Women should inform their doctor if they wish to become pregnant or think they might be pregnant. There is a potential for serious side effects to an unborn child. Talk to your health care professional or pharmacist for more information. Do  not breast-feed an infant while taking this medicine orfor 4 months after the last dose. What side effects may I notice from receiving this medication? Side effects that you should report to your doctor or health care professionalas soon as possible: allergic reactions like skin rash, itching or hives, swelling of the face, lips, or tongue bloody or black, tarry breathing problems changes in vision chest pain chills confusion constipation cough diarrhea dizziness or feeling faint or lightheaded fast or irregular heartbeat fever flushing joint pain low blood counts - this medicine may decrease the number of white blood cells, red blood cells and platelets. You may be at increased risk for infections and bleeding. muscle pain muscle weakness pain, tingling, numbness in the hands or feet persistent headache redness, blistering, peeling or loosening of the skin, including inside the mouth signs and symptoms of high blood sugar such as dizziness; dry mouth; dry skin; fruity breath; nausea; stomach pain; increased hunger or thirst; increased urination signs and symptoms of kidney injury like trouble passing urine or change in the amount of urine signs and symptoms of liver injury like dark urine, light-colored stools, loss of appetite, nausea, right upper belly pain, yellowing of the eyes or skin sweating swollen lymph nodes weight loss Side effects that usually do not require medical attention (report to yourdoctor or health care professional if they continue or are bothersome): decreased appetite hair   loss tiredness This list may not describe all possible side effects. Call your doctor for medical advice about side effects. You may report side effects to FDA at1-800-FDA-1088. Where should I keep my medication? This drug is given in a hospital or clinic and will not be stored at home. NOTE: This sheet is a summary. It may not cover all possible information. If you have questions about  this medicine, talk to your doctor, pharmacist, orhealth care provider.  2022 Elsevier/Gold Standard (2019-06-15 21:44:53)  

## 2021-01-08 ENCOUNTER — Encounter: Payer: Self-pay | Admitting: Internal Medicine

## 2021-01-11 ENCOUNTER — Other Ambulatory Visit: Payer: Self-pay

## 2021-01-11 ENCOUNTER — Encounter: Payer: Medicaid Other | Admitting: Physician Assistant

## 2021-01-11 DIAGNOSIS — I872 Venous insufficiency (chronic) (peripheral): Secondary | ICD-10-CM | POA: Diagnosis not present

## 2021-01-11 NOTE — Progress Notes (Addendum)
[name], [name] ([id number]) Visit Report for 01/11/2021 Chief Complaint Document Details Patient Name: Chad [name], Chad [name] Date of Service: 01/11/2021 9:15 AM Medical Record Number: [id number] Patient Account Number: [id number] Date of Birth/Sex: March 20, 1961 (60 y.o. M) Treating RN: Carlene Coria Primary Care Provider: Clayborn Bigness Other Clinician: Referring Provider: Clayborn Bigness Treating Provider/Extender: Skipper Cliche in Treatment: 21 Information Obtained from: Patient Chief Complaint Left ankle and right foot and heel ulcers Electronic Signature(s) Signed: 01/11/2021 9:26:56 AM By: Worthy Keeler PA-C Entered By: Worthy Keeler on 01/11/2021 09:26:56 Chad [name] ([id number]) -------------------------------------------------------------------------------- HPI Details Patient Name: Chad [name] Date of Service: 01/11/2021 9:15 AM Medical Record Number: [id number] Patient Account Number: [id number] Date of Birth/Sex: 1960-08-08 (60 y.o. M) Treating RN: Carlene Coria Primary Care Provider: Clayborn Bigness Other Clinician: Referring Provider: Clayborn Bigness Treating Provider/Extender: Skipper Cliche in Treatment: 21 History of Present Illness HPI Description: 10/08/18 on evaluation today patient actually presents to our office for initial evaluation concerning wounds that he has of the bilateral lower extremities. 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 He also has bilateral venous stasis. He apparently has been homeless currently following discharge from the hospital apparently he has been placed at [facility] which is is a skilled nursing facility locally. Nonetheless fortunately he does not show any signs of infection at this time which is good news. In fact several of the wound actually appears to be showing some signs  of improvement already in my pinion. There are a couple areas in the left leg in particular there likely gonna require some sharp debridement to help clear away some necrotic tissue and help with more sufficient healing. No fevers, chills, nausea, or vomiting noted at this time. 10/15/18 on evaluation today patient actually appears to be doing very well in regard to his bilateral lower extremities. He's been tolerating the dressing changes without complication. Fortunately there does not appear to be any evidence of active infection at this time which is great news. Overall I'm actually very pleased with how this has progressed in just one visits time. Readmission: 08/14/2020 upon evaluation today patient presents for re-evaluation here in our clinic. He is having issues with his left ankle region as well as his right toe and his right heel. He tells me that the toe and heel actually began as a area that was itching that he was scratching and then subsequently opened up into wounds. These may have been abscess areas I presume based on what I am seeing currently. With regard to his left ankle region he tells me this was a similar type occurrence although he does have venous stasis this very well may be more of a venous leg ulcer more than anything. Nonetheless I do believe that the patient would benefit from appropriate and aggressive wound care to try to help get things under better control here. He does have history of a stroke on the left side affecting him to some degree there that he is able to stand although he does have some residual weakness. Otherwise again the patient does have chronic venous insufficiency as previously noted. His arterial studies most recently obtained showed that he had an ABI on the right of 1.16 with a TBI of 0.52 and on the left and ABI of 1.14 with a TBI of 0.81. That was obtained on 06/19/2020. 08/28/2020 upon evaluation today  patient appears to be doing decently well in  regard to his wounds in general. He has been tolerating the dressing changes without complication. Fortunately there does not appear to be any signs of active infection which is great news. With that being said I think the [facility] is doing a good job I would recommend that we likely continue with that currently. 09/11/2020 upon evaluation today patient's wounds did not appear to be doing too poorly but again he is not really showing signs of significant improvement with regard to any of the wounds on the right. None of them have Hydrofera Blue on them I am not exactly sure why this is not being followed as the facility did not contact us to let us know of any issues with obtaining dressings or otherwise. With that being said he is supposed to be using Hydrofera Blue on both of the wounds on the right foot as well as the ankle wound on the left side. 09/18/2020 upon evaluation today patient appears to be doing poorly with regard to his wounds. Again right now the left ankle in particular showed signs of extreme maceration. Apparently he was told by someone with staff at [place] they could not get the [facility]. With that being said this is something that is never been relayed to [place] one way or another. Also the patient subsequently has not supposed to have a border gauze dressing on. He should have an ABD pad and roll gauze to secure as this drains much too much just to have a border gauze dressing to cover. Nonetheless the fact that they are not using the appropriate dressing is directly causing deterioration of the left ankle wound it is significantly worse today compared to what it was previous. I did attempt to call [facility] while the patient was here I called three times and got no one to even pick up the phone. After this I had my for an office coordinator call and she was able to finally get through and leave a message with the D ON as of dictation of this note  which is roughly about an hour and a half later I still have not been able to speak with anyone at the facility. 09/25/2020 upon evaluation today patient actually showing signs of good improvement which is excellent news. He has been tolerating the dressing changes without complication. Fortunately there is no signs of active infection which is great news. No fevers, chills, nausea, vomiting, or diarrhea. I do feel like the facility has been doing a much better job at taking care of him as far as the dressings are concerned. However the director of nursing never did call me back. 10/09/2020 upon evaluation today patient appears to be doing well with regard to his wound. The toe ulcer did require some debridement but the other 2 areas actually appear to be doing quite well. 10/19/2020 upon evaluation today patient actually appears to be doing very well in regard to his wounds. In fact the heel does appear to be completely healed. The toe is doing better in the medial ankle on the left is also doing better. Overall I think he is headed in the right direction. 10/26/2020 upon evaluation today patient appears to be doing well with regard to his wound. He is showing signs of improvement which is great news and overall I am very pleased with where things stand today. No fevers, chills, nausea, vomiting, or diarrhea. 11/02/2020 upon evaluation today patient appears to  be doing well with regard to his wounds. He has been tolerating the dressing changes without complication overall I am extremely pleased with where things stand today. He in regard to the toe is almost completely healed and the medial ankle on the left is doing much better. 11/09/2020 upon evaluation today patient appears to be doing a little poorly in regard to his left medial ankle ulcer. Fortunately there does not appear to be any signs of systemic infection but unfortunately locally he does appear to be infected in fact he has blue-green drainage  consistent with Pseudomonas. NERO, SAWATZKY ([id number]) 11/16/2020 upon evaluation today patient appears to be doing well with regard to his wound. It actually appears to be doing better. I did place him on gentamicin cream since the Cipro was actually resistant even though he was positive for Pseudomonas on culture. Overall I think that he does seem to be doing better though I am unsure whether or not they have actually been putting the cream on. The patient is not sure that we did talk to the nurse directly and she was going to initiate that treatment. Fortunately there does not appear to be any signs of active infection at this time. No fevers, chills, nausea, vomiting, or diarrhea. 4/28; the area on the right second toe is close to healed. Left medial ankle required debridement 12/07/2020 upon evaluation today patient appears to be doing well with regard to his wounds. In fact the right second toe appears to be completely healed which is great news. Fortunately there does not appear to be any signs of active infection at this time which is also great news. I think we can probably discontinue the gentamicin on top of everything else. 12/14/2020 upon evaluation today patient appears to be doing well with regard to his wound. He is making good progress and overall very pleased with where things stand today. There is no signs of active infection at this time which is great news. 12/28/2020 upon evaluation today patient appears to be doing well with regard to his wounds. He has been tolerating the dressing changes without complication. Fortunately there is no signs of active infection at this time. No fevers, chills, nausea, vomiting, or diarrhea. 12/28/2020 upon evaluation today patient's wound bed actually showed signs of excellent improvement. He has great epithelization and granulation I do not see any signs of infection overall I am extremely pleased with where things stand at this point. No fevers,  chills, nausea, vomiting, or diarrhea. 01/11/2021 upon evaluation today patient appears to be doing well with regard to his wound on his leg. He has been tolerating the dressing changes without complication. Fortunately there does not appear to be any signs of active infection which is great news. No fevers, chills, nausea, vomiting, or diarrhea. Electronic Signature(s) Signed: 01/11/2021 10:26:20 AM By: Worthy Keeler PA-C Entered By: Worthy Keeler on 01/11/2021 10:26:20 Chad [name] ([id number]) -------------------------------------------------------------------------------- Physical Exam Details Patient Name: Chad [name] Date of Service: 01/11/2021 9:15 AM Medical Record Number: [id number] Patient Account Number: [id number] Date of Birth/Sex: 07/21/1961 (60 y.o. M) Treating RN: Carlene Coria Primary Care Provider: Clayborn Bigness Other Clinician: Referring Provider: Clayborn Bigness Treating Provider/Extender: Skipper Cliche in Treatment: 34 Constitutional Well-nourished and well-hydrated in no acute distress. Respiratory normal breathing without difficulty. Psychiatric this patient is able to make decisions and demonstrates good insight into disease process. Alert and Oriented x 3. pleasant and cooperative. Notes Patient's wound bed does not require any sharp debridement at this point.  The facility is managing this quite nicely and overall I think this is definitely headed in an appropriate direction. No fevers, chills, nausea, vomiting, or diarrhea. Electronic Signature(s) Signed: 01/11/2021 10:26:37 AM By: Worthy Keeler PA-C Entered By: Worthy Keeler on 01/11/2021 10:26:37 Chad [name] ([id number]) -------------------------------------------------------------------------------- Physician Orders Details Patient Name: Chad [name] Date of Service: 01/11/2021 9:15 AM Medical Record Number: [id number] Patient Account Number: [id number] Date of Birth/Sex: 19-Jan-1961 (60  y.o. 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 Wound Treatment Wound #8 - Ankle Wound Laterality: Left, Medial Cleanser: Normal Saline 1 x Per Day/30 Days Discharge Instructions: Wash your hands with soap and water. Remove old dressing, discard into plastic bag and place into trash. Cleanse the wound with Normal Saline prior to applying a clean dressing using gauze sponges, not tissues or cotton balls. Do not scrub or use excessive force. Pat dry using gauze sponges, not tissue or cotton balls. Peri-Wound Care: Desitin Maximum Strength Ointment 4 (oz) 1 x Per Day/30 Days Discharge Instructions: Zinc periwound Primary Dressing: Prisma 4.34 (in) 1 x Per Day/30 Days Discharge Instructions: Moisten w/normal saline or sterile water; Cover wound as directed. Do not remove from wound bed. Secondary Dressing: ABD Pad 5x9 (in/in) 1 x Per Day/30 Days Discharge Instructions: Cover with ABD pad Secondary Dressing: Kerlix 4.5 x 4.1 (in/yd) 1 x Per Day/30 Days Discharge Instructions: Apply Kerlix 4.5 x 4.1 (in/yd) as instructed Secured With: 35M Medipore H Soft Cloth Surgical Tape, 2x2 (in/yd) 1 x Per Day/30 Days Electronic Signature(s) Signed: 01/11/2021 4:51:48 PM By: Worthy Keeler PA-C Signed: 01/16/2021 4:27:57 PM By: Carlene Coria RN Entered By:  Carlene Coria on 01/11/2021 10:21:45 Chad [name] ([id number]) -------------------------------------------------------------------------------- Problem List Details Patient Name: Chad [name] Date of Service: 01/11/2021 9:15 AM Medical Record Number: [id number] Patient Account Number: [id number] Date of Birth/Sex: 16-Apr-1961 (60 y.o. M) Treating RN: Carlene Coria Primary Care Provider: Clayborn Bigness Other Clinician: Referring Provider: Clayborn Bigness Treating Provider/Extender: Skipper Cliche in Treatment: 21 Active Problems ICD-10 Encounter Code Description Active Date MDM Diagnosis I87.2 Venous insufficiency (chronic) (peripheral) 08/14/2020 No Yes L97.322 Non-pressure chronic ulcer of left ankle with fat layer exposed 08/14/2020 No Yes L97.412 Non-pressure chronic ulcer of right heel and midfoot with fat layer 08/14/2020 No Yes exposed L97.512 Non-pressure chronic ulcer of other part of right foot with fat layer 08/14/2020 No Yes exposed 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 M) Treating RN: Carlene Coria Primary Care Provider: Clayborn Bigness Other Clinician: Referring Provider: Clayborn Bigness Treating Provider/Extender: Skipper Cliche in Treatment: 21 Subjective Chief Complaint Information obtained from Patient Left ankle and right foot  and heel ulcers History of Present Illness (HPI) 10/08/18 on evaluation today patient actually presents to  our office for initial evaluation concerning wounds that he has of the bilateral lower extremities. 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 He also has bilateral venous stasis. He apparently has been homeless currently following discharge from the hospital apparently he has been placed at [facility] which is is a skilled nursing facility locally. Nonetheless fortunately he does not show any signs of infection at this time which is good news. In fact several of the wound actually appears to be showing some signs of improvement already in my pinion. There are a couple areas in the left leg in particular there likely gonna require some sharp debridement to help clear away some necrotic tissue and help with more sufficient healing. No fevers, chills, nausea, or vomiting noted at this time. 10/15/18 on evaluation today patient actually appears to be doing very well in regard to his bilateral lower extremities. He's been tolerating the dressing changes without complication. Fortunately there does not appear to be any evidence of active infection at this time which is great news. Overall I'm actually very pleased with how this has progressed in just one visits time. Readmission: 08/14/2020 upon evaluation today patient presents for re-evaluation here in our clinic. He is having issues with his left ankle region as well as his right toe and his right heel. He tells me that the toe and heel actually began as a area that was itching that he was scratching and then subsequently opened up into wounds. These may have been abscess areas I presume based on what I am seeing currently. With regard to his left ankle region he tells me this was a similar type occurrence although he does have venous stasis this very well may be more of a venous leg  ulcer more than anything. Nonetheless I do believe that the patient would benefit from appropriate and aggressive wound care to try to help get things under better control here. He does have history of a stroke on the left side affecting him to some degree there that he is able to stand although he does have some residual weakness. Otherwise again the patient does have chronic venous insufficiency as previously noted. His arterial studies most recently obtained showed that he had an ABI on the right of 1.16 with a TBI of 0.52 and on the left and ABI of 1.14 with a TBI of 0.81. That was obtained on 06/19/2020. 08/28/2020 upon evaluation today patient appears to be doing decently well in regard to his wounds in general. He has been tolerating the dressing changes without complication. Fortunately there does not appear to be any signs of active infection which is great news. With that being said I think the [facility] is doing a good job I would recommend that we likely continue with that currently. 09/11/2020 upon evaluation today patient's wounds did not appear to be doing too poorly but again he is not really showing signs of significant improvement with regard to any of the wounds on the right. None of them have Hydrofera Blue on them I am not exactly sure why this is not being followed as the facility did not contact us to let us know of any issues with obtaining dressings or otherwise. With that being said he is supposed  to be using Hydrofera Blue on both of the wounds on the right foot as well as the ankle wound on the left side. 09/18/2020 upon evaluation today patient appears to be doing poorly with regard to his wounds. Again right now the left ankle in particular showed signs of extreme maceration. Apparently he was told by someone with staff at [place] they could not get the [facility]. With that being said this is something that is never been relayed to [place] one way or another.  Also the patient subsequently has not supposed to have a border gauze dressing on. He should have an ABD pad and roll gauze to secure as this drains much too much just to have a border gauze dressing to cover. Nonetheless the fact that they are not using the appropriate dressing is directly causing deterioration of the left ankle wound it is significantly worse today compared to what it was previous. I did attempt to call [facility] while the patient was here I called three times and got no one to even pick up the phone. After this I had my for an office coordinator call and she was able to finally get through and leave a message with the D ON as of dictation of this note which is roughly about an hour and a half later I still have not been able to speak with anyone at the facility. 09/25/2020 upon evaluation today patient actually showing signs of good improvement which is excellent news. He has been tolerating the dressing changes without complication. Fortunately there is no signs of active infection which is great news. No fevers, chills, nausea, vomiting, or diarrhea. I do feel like the facility has been doing a much better job at taking care of him as far as the dressings are concerned. However the director of nursing never did call me back. 10/09/2020 upon evaluation today patient appears to be doing well with regard to his wound. The toe ulcer did require some debridement but the other 2 areas actually appear to be doing quite well. 10/19/2020 upon evaluation today patient actually appears to be doing very well in regard to his wounds. In fact the heel does appear to be completely healed. The toe is doing better in the medial ankle on the left is also doing better. Overall I think he is headed in the right direction. 10/26/2020 upon evaluation today patient appears to be doing well with regard to his wound. He is showing signs of improvement which is great news and overall I am very  pleased with where things stand today. No fevers, chills, nausea, vomiting, or diarrhea. 11/02/2020 upon evaluation today patient appears to be doing well with regard to his wounds. He has been tolerating the dressing changes without complication overall I am extremely pleased with where things stand today. He in regard to the toe is almost completely healed and the medial [facility] ([id number]) ankle on the left is doing much better. 11/09/2020 upon evaluation today patient appears to be doing a little poorly in regard to his left medial ankle ulcer. Fortunately there does not appear to be any signs of systemic infection but unfortunately locally he does appear to be infected in fact he has blue-green drainage consistent with Pseudomonas. 11/16/2020 upon evaluation today patient appears to be doing well with regard to his wound. It actually appears to be doing better. I did place him on gentamicin cream since the Cipro was actually resistant even though he was positive for Pseudomonas  on culture. Overall I think that he does seem to be doing better though I am unsure whether or not they have actually been putting the cream on. The patient is not sure that we did talk to the nurse directly and she was going to initiate that treatment. Fortunately there does not appear to be any signs of active infection at this time. No fevers, chills, nausea, vomiting, or diarrhea. 4/28; the area on the right second toe is close to healed. Left medial ankle required debridement 12/07/2020 upon evaluation today patient appears to be doing well with regard to his wounds. In fact the right second toe appears to be completely healed which is great news. Fortunately there does not appear to be any signs of active infection at this time which is also great news. I think we can probably discontinue the gentamicin on top of everything else. 12/14/2020 upon evaluation today patient appears to be doing well with regard to  his wound. He is making good progress and overall very pleased with where things stand today. There is no signs of active infection at this time which is great news. 12/28/2020 upon evaluation today patient appears to be doing well with regard to his wounds. He has been tolerating the dressing changes without complication. Fortunately there is no signs of active infection at this time. No fevers, chills, nausea, vomiting, or diarrhea. 12/28/2020 upon evaluation today patient's wound bed actually showed signs of excellent improvement. He has great epithelization and granulation I do not see any signs of infection overall I am extremely pleased with where things stand at this point. No fevers, chills, nausea, vomiting, or diarrhea. 01/11/2021 upon evaluation today patient appears to be doing well with regard to his wound on his leg. He has been tolerating the dressing changes without complication. Fortunately there does not appear to be any signs of active infection which is great news. No fevers, chills, nausea, vomiting, or diarrhea. Objective Constitutional Well-nourished and well-hydrated in no acute distress. Vitals Time Taken: 9:18 AM, Height: 67 in, Weight: 160 lbs, BMI: 25.1, Temperature: 98.2 F, Pulse: 80 bpm, Respiratory Rate: 18 breaths/min, Blood Pressure: 124/82 mmHg. Respiratory normal breathing without difficulty. Psychiatric this patient is able to make decisions and demonstrates good insight into disease process. Alert and Oriented x 3. pleasant and cooperative. General Notes: Patient's wound bed does not require any sharp debridement at this point. The facility is managing this quite nicely and overall I think this is definitely headed in an appropriate direction. No fevers, chills, nausea, vomiting, or diarrhea. Integumentary (Hair, Skin) Wound #8 status is Open. Original cause of wound was Gradually Appeared. The date acquired was: 07/12/2019. The wound has been in treatment 21  weeks. The wound is located on the Left,Medial Ankle. The wound measures 2cm length x 1.9cm width x 0.1cm depth; 2.985cm^2 area and 0.298cm^3 volume. There is Fat Layer (Subcutaneous Tissue) exposed. There is no tunneling or undermining noted. There is a medium amount of serous drainage noted. The wound margin is flat and intact. There is medium (34-66%) red, pink granulation within the wound bed. There is a medium (34-66%) amount of necrotic tissue within the wound bed including Adherent Slough. 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 WOUND #8: - Ankle Wound Laterality: Left, Medial Cleanser: Normal Saline 1 x Per Day/30 Days Discharge Instructions: Wash your hands with soap and water. Remove old dressing, discard into plastic bag and place into trash. Cleanse the wound with Normal Saline prior to applying a clean dressing using gauze sponges, not tissues or cotton balls. Do not scrub or use excessive force. Pat dry using gauze sponges, not tissue or cotton balls. Peri-Wound Care: Desitin Maximum Strength Ointment 4 (oz) 1 x Per Day/30 Days Discharge Instructions: Zinc periwound Primary Dressing: Prisma 4.34 (in) 1 x Per Day/30 Days Discharge Instructions: Moisten w/normal saline or sterile water; Cover wound as directed. Do not remove from wound bed. Secondary Dressing: ABD Pad 5x9 (in/in) 1 x Per Day/30 Days Discharge Instructions: Cover with ABD pad Secondary Dressing: Kerlix 4.5 x 4.1 (in/yd) 1 x Per Day/30 Days Discharge Instructions: Apply Kerlix 4.5 x  4.1 (in/yd) as instructed Secured With: 53M Medipore H Soft Cloth Surgical Tape, 2x2 (in/yd) 1 x Per Day/30 Days 1. Would recommend that we going continue with wound care measures as before and the patient is in agreement with plan. This includes the use of the silver collagen dressing which is doing a great job. 2. We will cover this with an ABD pad secured with tape. 3. Muscle and recommend he continue to elevate his legs much as possible to help with edema control. We will see patient back for reevaluation in 1 week here in the clinic. If anything worsens or changes patient will contact our office for additional recommendations. Electronic Signature(s) Signed: 01/11/2021 10:26:59 AM By: Worthy Keeler PA-C Entered By: Worthy Keeler on 01/11/2021 10:26:59 Chad [name] ([id number]) -------------------------------------------------------------------------------- SuperBill Details Patient Name: Chad [name] Date of Service: 01/11/2021 Medical Record Number: [id number] Patient Account Number: [id number] Date of Birth/Sex: 1961-06-21 (60 y.o. M) Treating RN: Carlene Coria Primary Care Provider: Clayborn Bigness Other Clinician: Referring Provider: Clayborn Bigness Treating Provider/Extender: Skipper Cliche in Treatment: 21 Diagnosis Coding ICD-10 Codes Code Description I87.2 Venous insufficiency (chronic) (peripheral) L97.322 Non-pressure chronic ulcer of left ankle with fat layer exposed L97.412 Non-pressure chronic ulcer of right heel and midfoot with fat layer exposed L97.512 Non-pressure chronic ulcer of other part of right foot with fat layer exposed 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 side Facility Procedures CPT4 Code: 44010272 Description: [phone] - WOUND CARE VISIT-LEV 2 EST PT Modifier: Quantity: 1 Physician Procedures CPT4 Code: 4034742 Description: 99213 - WC PHYS LEVEL 3 - EST PT Modifier: Quantity: 1 CPT4  Code: Description: ICD-10 Diagnosis Description I87.2 Venous insufficiency (chronic) (peripheral) L97.322 Non-pressure chronic ulcer of left ankle with fat layer exposed L97.412 Non-pressure chronic ulcer of right heel and midfoot with fat layer exp L97.512  Non-pressure chronic ulcer of other part of right foot with fat layer e Modifier: osed xposed Quantity: Electronic Signature(s) Signed: 01/11/2021 10:27:10 AM By: Worthy Keeler PA-C Entered By: Worthy Keeler on 01/11/2021 10:27:10

## 2021-01-16 NOTE — Progress Notes (Signed)
HOGAN, HOOBLER ([id number]) Visit Report for 01/11/2021 Arrival Information Details Patient Name: Chad [name], Chad [name] Date of Service: 01/11/2021 9:15 AM Medical Record Number: [id number] Patient Account Number: [id number] Date of Birth/Sex: 09/12/1960 (60 y.o. M) Treating RN: Carlene Coria Primary Care Emmily Pellegrin: Clayborn Bigness Other Clinician: Referring Hudson Lehmkuhl: Clayborn Bigness Treating Vada Swift/Extender: Skipper Cliche in Treatment: 21 Visit Information History Since Last Visit Added or deleted any medications: No Patient Arrived: Wheel Chair Had a fall or experienced change in No Arrival Time: 09:21 activities of daily living that may affect Accompanied By: self risk of falls: Transfer Assistance: None Hospitalized since last visit: No Patient Identification Verified: Yes Pain Present Now: No Secondary Verification Process Completed: Yes Patient Requires Transmission-Based No Precautions: Patient Has Alerts: Yes Patient Alerts: Patient on Blood Thinner ABI right 1.16 ABI left 1.14 Electronic Signature(s) Signed: 01/15/2021 4:23:36 PM By: Jeanine Luz Entered By: Jeanine Luz on 01/11/2021 09:22:02 Chad [name] ([id number]) -------------------------------------------------------------------------------- Clinic Level of Care Assessment Details Patient Name: Chad [name] Date of Service: 01/11/2021 9:15 AM Medical Record Number: [id number] Patient Account Number: [id number] Date of Birth/Sex: 03/22/1961 (60 y.o. M) Treating RN: Carlene Coria Primary Care Angelus Hoopes: Clayborn Bigness Other Clinician: Referring Nathanyl Andujo: Clayborn Bigness Treating Jasiri Hanawalt/Extender: Skipper Cliche in Treatment: 21 Clinic Level of Care Assessment Items TOOL 4 Quantity Score X - Use when only an EandM is performed on FOLLOW-UP visit 1 0 ASSESSMENTS - Nursing Assessment / Reassessment '[]'  - Reassessment of Co-morbidities (includes updates in patient status) 0 '[]'  - 0 Reassessment of Adherence to  Treatment Plan ASSESSMENTS - Wound and Skin Assessment / Reassessment X - Simple Wound Assessment / Reassessment - one wound 1 5 '[]'  - 0 Complex Wound Assessment / Reassessment - multiple wounds '[]'  - 0 Dermatologic / Skin Assessment (not related to wound area) ASSESSMENTS - Focused Assessment '[]'  - Circumferential Edema Measurements - multi extremities 0 '[]'  - 0 Nutritional Assessment / Counseling / Intervention '[]'  - 0 Lower Extremity Assessment (monofilament, tuning fork, pulses) '[]'  - 0 Peripheral Arterial Disease Assessment (using hand held doppler) ASSESSMENTS - Ostomy and/or Continence Assessment and Care '[]'  - Incontinence Assessment and Management 0 '[]'  - 0 Ostomy Care Assessment and Management (repouching, etc.) PROCESS - Coordination of Care X - Simple Patient / Family Education for ongoing care 1 15 '[]'  - 0 Complex (extensive) Patient / Family Education for ongoing care '[]'  - 0 Staff obtains Programmer, systems, Records, Test Results / Process Orders '[]'  - 0 Staff telephones HHA, Nursing Homes / Clarify orders / etc '[]'  - 0 Routine Transfer to another Facility (non-emergent condition) '[]'  - 0 Routine Hospital Admission (non-emergent condition) '[]'  - 0 New Admissions / Biomedical engineer / Ordering NPWT, Apligraf, etc. '[]'  - 0 Emergency Hospital Admission (emergent condition) X- 1 10 Simple Discharge Coordination '[]'  - 0 Complex (extensive) Discharge Coordination PROCESS - Special Needs '[]'  - Pediatric / Minor Patient Management 0 '[]'  - 0 Isolation Patient Management '[]'  - 0 Hearing / Language / Visual special needs '[]'  - 0 Assessment of Community assistance (transportation, D/C planning, etc.) '[]'  - 0 Additional assistance / Altered mentation '[]'  - 0 Support Surface(s) Assessment (bed, cushion, seat, etc.) INTERVENTIONS - Wound Cleansing / Measurement Kuriakose, Christina ([id number]) X- 1 5 Simple Wound Cleansing - one wound '[]'  - 0 Complex Wound Cleansing - multiple wounds X- 1  5 Wound Imaging (photographs - any number of wounds) '[]'  - 0 Wound Tracing (instead of photographs) X- 1 5 Simple Wound Measurement - one wound '[]'  - 0 Complex Wound  Measurement - multiple wounds INTERVENTIONS - Wound Dressings X - Small Wound Dressing one or multiple wounds 1 10 '[]'  - 0 Medium Wound Dressing one or multiple wounds '[]'  - 0 Large Wound Dressing one or multiple wounds X- 1 5 Application of Medications - topical '[]'  - 0 Application of Medications - injection INTERVENTIONS - Miscellaneous '[]'  - External ear exam 0 '[]'  - 0 Specimen Collection (cultures, biopsies, blood, body fluids, etc.) '[]'  - 0 Specimen(s) / Culture(s) sent or taken to Lab for analysis '[]'  - 0 Patient Transfer (multiple staff / Harrel Lemon Lift / Similar devices) '[]'  - 0 Simple Staple / Suture removal (25 or less) '[]'  - 0 Complex Staple / Suture removal (26 or more) '[]'  - 0 Hypo / Hyperglycemic Management (close monitor of Blood Glucose) '[]'  - 0 Ankle / Brachial Index (ABI) - do not check if billed separately X- 1 5 Vital Signs Has the patient been seen at the hospital within the last three years: Yes Total Score: 65 Level Of Care: New/Established - Level 2 Electronic Signature(s) Signed: 01/16/2021 4:27:57 PM By: Carlene Coria RN Entered By: Carlene Coria on 01/11/2021 10:22:17 Chad [name] ([id number]) -------------------------------------------------------------------------------- Lower Extremity Assessment Details Patient Name: Chad [name] Date of Service: 01/11/2021 9:15 AM Medical Record Number: [id number] Patient Account Number: [id number] Date of Birth/Sex: [birth year] (60 y.o. M) Treating RN: Carlene Coria Primary Care Meshulem Onorato: Clayborn Bigness Other Clinician: Referring Cambry Spampinato: Clayborn Bigness Treating Shamonica Schadt/Extender: Jeri Cos Weeks in Treatment: 21 Vascular Assessment Pulses: Dorsalis Pedis Palpable: [Left:Yes] Electronic Signature(s) Signed: 01/15/2021 4:23:36 PM By: Jeanine Luz Signed: 01/16/2021 4:27:57 PM By: Carlene Coria RN Entered By: Jeanine Luz on 01/11/2021 09:29:44 Chad [name] ([id number]) -------------------------------------------------------------------------------- Multi Wound Chart Details Patient Name: Chad [name] Date of Service: 01/11/2021 9:15 AM Medical Record Number: [id number] Patient Account Number: [id number] Date of Birth/Sex: 12-21-1960 (60 y.o. M) Treating RN: Carlene Coria Primary Care Lauriann Milillo: Clayborn Bigness Other Clinician: Referring Brandin Dilday: Clayborn Bigness Treating Halaina Vanduzer/Extender: Skipper Cliche in Treatment: 21 Vital Signs Height(in): 67 Pulse(bpm): 80 Weight(lbs): 160 Blood Pressure(mmHg): 124/82 Body Mass Index(BMI): 25 Temperature(F): 98.2 Respiratory Rate(breaths/min): 18 Photos: [N/A:N/A] Wound Location: Left, Medial Ankle N/A N/A Wounding Event: Gradually Appeared N/A N/A Primary Etiology: Venous Leg Ulcer N/A N/A Comorbid History: Anemia, Chronic Obstructive N/A N/A Pulmonary Disease (COPD), Hepatitis C, Received Chemotherapy Date Acquired: 07/12/2019 N/A N/A Weeks of Treatment: 21 N/A N/A Wound Status: Open N/A N/A Measurements L x W x D (cm) 2x1.9x0.1 N/A N/A Area (cm) : 2.985 N/A N/A Volume (cm) : 0.298 N/A N/A % Reduction in Area: 49.30% N/A N/A % Reduction in Volume: 49.40% N/A N/A Classification: Full Thickness Without Exposed N/A N/A Support Structures Exudate Amount: Medium N/A N/A Exudate Type: Serous N/A N/A Exudate Color: amber N/A N/A Wound Margin: Flat and Intact N/A N/A Granulation Amount: Medium (34-66%) N/A N/A Granulation Quality: Red, Pink N/A N/A Necrotic Amount: Medium (34-66%) N/A N/A Exposed Structures: Fat Layer (Subcutaneous Tissue): N/A N/A Yes Fascia: No Tendon: No Muscle: No Joint: No Bone: No Epithelialization: Small (1-33%) N/A N/A Treatment Notes Electronic Signature(s) Signed: 01/16/2021 4:27:57 PM By: Carlene Coria RN Entered By: Carlene Coria on  01/11/2021 10:21:21 Chad [name] ([id number]) -------------------------------------------------------------------------------- Multi-Disciplinary Care Plan Details Patient Name: Chad [name] Date of Service: 01/11/2021 9:15 AM Medical Record Number: [id number] Patient Account Number: [id number] Date of Birth/Sex: Jun 05, 1961 (60 y.o. M) Treating RN: Carlene Coria Primary Care Sibyl Mikula: Clayborn Bigness Other Clinician: Referring Branon Sabine: Clayborn Bigness Treating Marrietta Thunder/Extender: Skipper Cliche in Treatment: 21 Active Inactive Wound/Skin  Impairment Nursing Diagnoses: Knowledge deficit related to ulceration/compromised skin integrity Goals: Patient/caregiver will verbalize understanding of skin care regimen Date Initiated: 08/14/2020 Date Inactivated: 10/09/2020 Target Resolution Date: 09/14/2020 Goal Status: Met Ulcer/skin breakdown will have a volume reduction of 30% by week 4 Date Initiated: 08/14/2020 Date Inactivated: 10/19/2020 Target Resolution Date: 09/14/2020 Goal Status: Met Ulcer/skin breakdown will heal within 14 weeks Date Initiated: 12/07/2020 Target Resolution Date: 01/23/2021 Goal Status: Active Interventions: Assess patient/caregiver ability to obtain necessary supplies Assess patient/caregiver ability to perform ulcer/skin care regimen upon admission and as needed Assess ulceration(s) every visit Notes: Electronic Signature(s) Signed: 01/16/2021 4:27:57 PM By: Carlene Coria RN Entered By: Carlene Coria on 01/11/2021 10:21:13 Chad [name] ([id number]) -------------------------------------------------------------------------------- Pain Assessment Details Patient Name: Chad [name] Date of Service: 01/11/2021 9:15 AM Medical Record Number: [id number] Patient Account Number: [id number] Date of Birth/Sex: 1961-04-15 (60 y.o. M) Treating RN: Carlene Coria Primary Care Sukari Grist: Clayborn Bigness Other Clinician: Referring Elbert Polyakov: Clayborn Bigness Treating Miski Feldpausch/Extender:  Skipper Cliche in Treatment: 21 Active Problems Location of Pain Severity and Description of Pain Patient Has Paino No Site Locations Rate the pain. Current Pain Level: 0 Pain Management and Medication Current Pain Management: Electronic Signature(s) Signed: 01/15/2021 4:23:36 PM By: Jeanine Luz Signed: 01/16/2021 4:27:57 PM By: Carlene Coria RN Entered By: Jeanine Luz on 01/11/2021 09:23:04 Chad [name] ([id number]) -------------------------------------------------------------------------------- Patient/Caregiver Education Details Patient Name: Chad [name] Date of Service: 01/11/2021 9:15 AM Medical Record Number: [id number] Patient Account Number: [id number] Date of Birth/Gender: 08/14/1960 (60 y.o. M) Treating RN: Carlene Coria Primary Care Physician: Clayborn Bigness Other Clinician: Referring Physician: Clayborn Bigness Treating Physician/Extender: Skipper Cliche in Treatment: 21 Education Assessment Education Provided To: Patient Education Topics Provided Wound/Skin Impairment: Methods: Explain/Verbal Responses: State content correctly Electronic Signature(s) Signed: 01/16/2021 4:27:57 PM By: Carlene Coria RN Entered By: Carlene Coria on 01/11/2021 10:22:34 Chad [name] ([id number]) -------------------------------------------------------------------------------- Wound Assessment Details Patient Name: Chad [name] Date of Service: 01/11/2021 9:15 AM Medical Record Number: [id number] Patient Account Number: [id number] Date of Birth/Sex: [birth year] (60 y.o. M) Treating RN: Carlene Coria Primary Care Kimala Horne: Clayborn Bigness Other Clinician: Referring Reade Trefz: Clayborn Bigness Treating Patrick Sohm/Extender: Skipper Cliche in Treatment: 21 Wound Status Wound Number: 8 Primary Venous Leg Ulcer Etiology: Wound Location: Left, Medial Ankle Wound Open Wounding Event: Gradually Appeared Status: Date Acquired: 07/12/2019 Comorbid Anemia, Chronic Obstructive Pulmonary  Disease (COPD), Weeks Of Treatment: 21 History: Hepatitis C, Received Chemotherapy Clustered Wound: No Photos Wound Measurements Length: (cm) 2 Width: (cm) 1.9 Depth: (cm) 0.1 Area: (cm) 2.985 Volume: (cm) 0.298 % Reduction in Area: 49.3% % Reduction in Volume: 49.4% Epithelialization: Small (1-33%) Tunneling: No Undermining: No Wound Description Classification: Full Thickness Without Exposed Support Structu Wound Margin: Flat and Intact Exudate Amount: Medium Exudate Type: Serous Exudate Color: amber res Foul Odor After Cleansing: No Slough/Fibrino Yes Wound Bed Granulation Amount: Medium (34-66%) Exposed Structure Granulation Quality: Red, Pink Fascia Exposed: No Necrotic Amount: Medium (34-66%) Fat Layer (Subcutaneous Tissue) Exposed: Yes Necrotic Quality: Adherent Slough Tendon Exposed: No Muscle Exposed: No Joint Exposed: No Bone Exposed: No Electronic Signature(s) Signed: 01/15/2021 4:23:36 PM By: Jeanine Luz Signed: 01/16/2021 4:27:57 PM By: Carlene Coria RN Entered By: Jeanine Luz on 01/11/2021 09:29:06 Chad [name] ([id number]) -------------------------------------------------------------------------------- Vitals Details Patient Name: Chad [name] Date of Service: 01/11/2021 9:15 AM Medical Record Number: [id number] Patient Account Number: [id number] Date of Birth/Sex: 1961-04-02 (60 y.o. M) Treating RN: Carlene Coria Primary Care Donia Yokum: Clayborn Bigness Other Clinician: Referring Tauheedah Bok: Clayborn Bigness Treating Fintan Grater/Extender: Jeri Cos Weeks in Treatment:  21 Vital Signs Time Taken: 09:18 Temperature (F): 98.2 Height (in): 67 Pulse (bpm): 80 Weight (lbs): 160 Respiratory Rate (breaths/min): 18 Body Mass Index (BMI): 25.1 Blood Pressure (mmHg): 124/82 Reference Range: 80 - 120 mg / dl Electronic Signature(s) Signed: 01/15/2021 4:23:36 PM By: Jeanine Luz Entered By: Jeanine Luz on 01/11/2021 09:22:55

## 2021-01-25 ENCOUNTER — Other Ambulatory Visit: Payer: Self-pay

## 2021-01-25 ENCOUNTER — Encounter: Payer: Medicaid Other | Attending: Physician Assistant | Admitting: Physician Assistant

## 2021-01-25 DIAGNOSIS — I872 Venous insufficiency (chronic) (peripheral): Secondary | ICD-10-CM | POA: Diagnosis not present

## 2021-01-25 DIAGNOSIS — L97512 Non-pressure chronic ulcer of other part of right foot with fat layer exposed: Secondary | ICD-10-CM | POA: Diagnosis not present

## 2021-01-25 DIAGNOSIS — L97322 Non-pressure chronic ulcer of left ankle with fat layer exposed: Secondary | ICD-10-CM | POA: Insufficient documentation

## 2021-01-25 DIAGNOSIS — I69354 Hemiplegia and hemiparesis following cerebral infarction affecting left non-dominant side: Secondary | ICD-10-CM | POA: Diagnosis not present

## 2021-01-25 DIAGNOSIS — L97412 Non-pressure chronic ulcer of right heel and midfoot with fat layer exposed: Secondary | ICD-10-CM | POA: Insufficient documentation

## 2021-01-25 NOTE — Progress Notes (Signed)
Chad [name], Chad [name] ([id number]) Visit Report for 01/25/2021 Arrival Information Details Patient Name: Chad [name], Chad [name] Date of Service: 01/25/2021 9:15 AM Medical Record Number: [id number] Patient Account Number: [id number] Date of Birth/Sex: 01-Apr-1961 (60 y.o. M) Treating RN: Dolan Amen Primary Care Welda Azzarello: Clayborn Bigness Other Clinician: Referring Akiel Fennell: Clayborn Bigness Treating Allizon Woznick/Extender: Skipper Cliche in Treatment: 23 Visit Information History Since Last Visit Had a fall or experienced change in No Patient Arrived: Wheel Chair activities of daily living that may affect Arrival Time: 08:51 risk of falls: Accompanied By: self Hospitalized since last visit: No Transfer Assistance: None Has Dressing in Place as Prescribed: Yes Patient Identification Verified: Yes Pain Present Now: No Secondary Verification Process Completed: Yes Patient Requires Transmission-Based No Precautions: Patient Has Alerts: Yes Patient Alerts: Patient on Blood Thinner ABI right 1.16 ABI left 1.14 Electronic Signature(s) Signed: 01/25/2021 3:39:58 PM By: Dolan Amen RN Entered By: Dolan Amen on 01/25/2021 08:54:18 Chad [name] ([id number]) -------------------------------------------------------------------------------- Clinic Level of Care Assessment Details Patient Name: Chad [name] Date of Service: 01/25/2021 9:15 AM Medical Record Number: [id number] Patient Account Number: [id number] Date of Birth/Sex: 1960-11-26 (60 y.o. M) Treating RN: Dolan Amen Primary Care Lexie Morini: Clayborn Bigness Other Clinician: Referring Aadil Sur: Clayborn Bigness Treating Janashia Parco/Extender: Skipper Cliche in Treatment: 23 Clinic Level of Care Assessment Items TOOL 4 Quantity Score X - Use when only an EandM is performed on FOLLOW-UP visit 1 0 ASSESSMENTS - Nursing Assessment / Reassessment X - Reassessment of Co-morbidities (includes updates in patient status) 1 10 X- 1 5 Reassessment of Adherence  to Treatment Plan ASSESSMENTS - Wound and Skin Assessment / Reassessment X - Simple Wound Assessment / Reassessment - one wound 1 5 '[]'  - 0 Complex Wound Assessment / Reassessment - multiple wounds '[]'  - 0 Dermatologic / Skin Assessment (not related to wound area) ASSESSMENTS - Focused Assessment '[]'  - Circumferential Edema Measurements - multi extremities 0 '[]'  - 0 Nutritional Assessment / Counseling / Intervention '[]'  - 0 Lower Extremity Assessment (monofilament, tuning fork, pulses) '[]'  - 0 Peripheral Arterial Disease Assessment (using hand held doppler) ASSESSMENTS - Ostomy and/or Continence Assessment and Care '[]'  - Incontinence Assessment and Management 0 '[]'  - 0 Ostomy Care Assessment and Management (repouching, etc.) PROCESS - Coordination of Care X - Simple Patient / Family Education for ongoing care 1 15 '[]'  - 0 Complex (extensive) Patient / Family Education for ongoing care '[]'  - 0 Staff obtains Programmer, systems, Records, Test Results / Process Orders '[]'  - 0 Staff telephones HHA, Nursing Homes / Clarify orders / etc '[]'  - 0 Routine Transfer to another Facility (non-emergent condition) '[]'  - 0 Routine Hospital Admission (non-emergent condition) '[]'  - 0 New Admissions / Biomedical engineer / Ordering NPWT, Apligraf, etc. '[]'  - 0 Emergency Hospital Admission (emergent condition) X- 1 10 Simple Discharge Coordination '[]'  - 0 Complex (extensive) Discharge Coordination PROCESS - Special Needs '[]'  - Pediatric / Minor Patient Management 0 '[]'  - 0 Isolation Patient Management '[]'  - 0 Hearing / Language / Visual special needs '[]'  - 0 Assessment of Community assistance (transportation, D/C planning, etc.) '[]'  - 0 Additional assistance / Altered mentation '[]'  - 0 Support Surface(s) Assessment (bed, cushion, seat, etc.) INTERVENTIONS - Wound Cleansing / Measurement Chad [name], Chad [name] ([id number]) X- 1 5 Simple Wound Cleansing - one wound '[]'  - 0 Complex Wound Cleansing - multiple wounds X-  1 5 Wound Imaging (photographs - any number of wounds) '[]'  - 0 Wound Tracing (instead of photographs) X- 1 5 Simple Wound Measurement - one wound '[]'  -  0 Complex Wound Measurement - multiple wounds INTERVENTIONS - Wound Dressings '[]'  - Small Wound Dressing one or multiple wounds 0 X- 1 15 Medium Wound Dressing one or multiple wounds '[]'  - 0 Large Wound Dressing one or multiple wounds '[]'  - 0 Application of Medications - topical '[]'  - 0 Application of Medications - injection INTERVENTIONS - Miscellaneous '[]'  - External ear exam 0 '[]'  - 0 Specimen Collection (cultures, biopsies, blood, body fluids, etc.) '[]'  - 0 Specimen(s) / Culture(s) sent or taken to Lab for analysis '[]'  - 0 Patient Transfer (multiple staff / Harrel Lemon Lift / Similar devices) '[]'  - 0 Simple Staple / Suture removal (25 or less) '[]'  - 0 Complex Staple / Suture removal (26 or more) '[]'  - 0 Hypo / Hyperglycemic Management (close monitor of Blood Glucose) '[]'  - 0 Ankle / Brachial Index (ABI) - do not check if billed separately X- 1 5 Vital Signs Has the patient been seen at the hospital within the last three years: Yes Total Score: 80 Level Of Care: New/Established - Level 3 Electronic Signature(s) Signed: 01/25/2021 3:39:58 PM By: Dolan Amen RN Entered By: Dolan Amen on 01/25/2021 09:07:11 Chad [name] ([id number]) -------------------------------------------------------------------------------- Encounter Discharge Information Details Patient Name: Chad [name] Date of Service: 01/25/2021 9:15 AM Medical Record Number: [id number] Patient Account Number: [id number] Date of Birth/Sex: August 21, 1960 (60 y.o. M) Treating RN: Dolan Amen Primary Care Nikodem Leadbetter: Clayborn Bigness Other Clinician: Referring Daelan Gatt: Clayborn Bigness Treating Dabney Schanz/Extender: Skipper Cliche in Treatment: 23 Encounter Discharge Information Items Discharge Condition: Stable Ambulatory Status: Wheelchair Discharge Destination: [place] Telephoned: No Orders Sent: Yes Transportation: Private Auto Accompanied By: self Schedule Follow-up Appointment: Yes Clinical Summary of Care: Electronic Signature(s) Signed: 01/25/2021 1:52:13 PM By: Dolan Amen RN Entered By: Dolan Amen on 01/25/2021 13:52:12 Chad [name] ([id number]) -------------------------------------------------------------------------------- Lower Extremity Assessment Details Patient Name: Chad [name] Date of Service: 01/25/2021 9:15 AM Medical Record Number: [id number] Patient Account Number: [id number] Date of Birth/Sex: Dec 13, 1960 (60 y.o. M) Treating RN: Dolan Amen Primary Care Yoel Kaufhold: Clayborn Bigness Other Clinician: Referring Valmai Vandenberghe: Clayborn Bigness Treating Mikael Debell/Extender: Jeri Cos Weeks in Treatment: 23 Edema Assessment Assessed: [Left: Yes] [Right: No] Edema: [Left: N] [Right: o] Vascular Assessment Pulses: Dorsalis Pedis Palpable: [Left:No Yes] Electronic Signature(s) Signed: 01/25/2021 3:39:58 PM By: Dolan Amen RN Entered By: Dolan Amen on 01/25/2021 09:00:13 Chad [name] ([id number]) -------------------------------------------------------------------------------- Multi Wound Chart Details Patient Name: Chad [name] Date of Service: 01/25/2021 9:15 AM Medical Record Number: [id number] Patient Account Number: [id number] Date of Birth/Sex: 1960/12/15 (60 y.o. M) Treating RN: Dolan Amen Primary Care Marriah Sanderlin: Clayborn Bigness Other Clinician: Referring Sakai Wolford: Clayborn Bigness Treating Quana Chamberlain/Extender: Skipper Cliche in Treatment: 23 Vital Signs Height(in): 84 Pulse(bpm): 23 Weight(lbs): 160 Blood Pressure(mmHg): 117/76 Body Mass Index(BMI): 25 Temperature(F): 98.5 Respiratory Rate(breaths/min): 18 Photos: [N/A:N/A] Wound Location: Left, Medial Ankle N/A N/A Wounding Event: Gradually Appeared N/A N/A Primary Etiology: Venous Leg Ulcer N/A N/A Comorbid History: Anemia, Chronic  Obstructive N/A N/A Pulmonary Disease (COPD), Hepatitis C, Received Chemotherapy Date Acquired: 07/12/2019 N/A N/A Weeks of Treatment: 23 N/A N/A Wound Status: Open N/A N/A Measurements L x W x D (cm) 2.5x1.3x0.1 N/A N/A Area (cm) : 2.553 N/A N/A Volume (cm) : 0.255 N/A N/A % Reduction in Area: 56.70% N/A N/A % Reduction in Volume: 56.70% N/A N/A Classification: Full Thickness Without Exposed N/A N/A Support Structures Exudate Amount: Medium N/A N/A Exudate Type: Serosanguineous N/A N/A Exudate Color: red, brown N/A N/A Wound Margin: Flat and Intact  N/A N/A Granulation Amount: Large (67-100%) N/A N/A Granulation Quality: Red, Pink N/A N/A Necrotic Amount: None Present (0%) N/A N/A Exposed Structures: Fat Layer (Subcutaneous Tissue): N/A N/A Yes Fascia: No Tendon: No Muscle: No Joint: No Bone: No Epithelialization: Medium (34-66%) N/A N/A Treatment Notes Electronic Signature(s) Signed: 01/25/2021 3:39:58 PM By: Dolan Amen RN Entered By: Dolan Amen on 01/25/2021 09:05:30 Chad [name] ([id number]) -------------------------------------------------------------------------------- Multi-Disciplinary Care Plan Details Patient Name: Chad [name] Date of Service: 01/25/2021 9:15 AM Medical Record Number: [id number] Patient Account Number: [id number] Date of Birth/Sex: 10-06-1960 (60 y.o. M) Treating RN: Dolan Amen Primary Care Breshay Ilg: Clayborn Bigness Other Clinician: Referring Vera Wishart: Clayborn Bigness Treating Cardarius Senat/Extender: Skipper Cliche in Treatment: 23 Active Inactive Wound/Skin Impairment Nursing Diagnoses: Knowledge deficit related to ulceration/compromised skin integrity Goals: Patient/caregiver will verbalize understanding of skin care regimen Date Initiated: 08/14/2020 Date Inactivated: 10/09/2020 Target Resolution Date: 09/14/2020 Goal Status: Met Ulcer/skin breakdown will have a volume reduction of 30% by week 4 Date Initiated: 08/14/2020 Date  Inactivated: 10/19/2020 Target Resolution Date: 09/14/2020 Goal Status: Met Ulcer/skin breakdown will heal within 14 weeks Date Initiated: 12/07/2020 Target Resolution Date: 01/23/2021 Goal Status: Active Interventions: Assess patient/caregiver ability to obtain necessary supplies Assess patient/caregiver ability to perform ulcer/skin care regimen upon admission and as needed Assess ulceration(s) every visit Notes: Electronic Signature(s) Signed: 01/25/2021 3:39:58 PM By: Dolan Amen RN Entered By: Dolan Amen on 01/25/2021 09:05:24 Chad [name] ([id number]) -------------------------------------------------------------------------------- Pain Assessment Details Patient Name: Chad [name] Date of Service: 01/25/2021 9:15 AM Medical Record Number: [id number] Patient Account Number: [id number] Date of Birth/Sex: [birth year] (60 y.o. M) Treating RN: Dolan Amen Primary Care Wylee Ogden: Clayborn Bigness Other Clinician: Referring Liseth Wann: Clayborn Bigness Treating Mikhia Dusek/Extender: Skipper Cliche in Treatment: 23 Active Problems Location of Pain Severity and Description of Pain Patient Has Paino No Site Locations Rate the pain. Current Pain Level: 0 Pain Management and Medication Current Pain Management: Electronic Signature(s) Signed: 01/25/2021 3:39:58 PM By: Dolan Amen RN Entered By: Dolan Amen on 01/25/2021 08:55:00 Chad [name] ([id number]) -------------------------------------------------------------------------------- Patient/Caregiver Education Details Patient Name: Chad [name] Date of Service: 01/25/2021 9:15 AM Medical Record Number: [id number] Patient Account Number: [id number] Date of Birth/Gender: October 09, 1960 (60 y.o. M) Treating RN: Dolan Amen Primary Care Physician: Clayborn Bigness Other Clinician: Referring Physician: Clayborn Bigness Treating Physician/Extender: Skipper Cliche in Treatment: 23 Education Assessment Education Provided  To: Patient Education Topics Provided Wound/Skin Impairment: Methods: Explain/Verbal Responses: State content correctly Electronic Signature(s) Signed: 01/25/2021 3:39:58 PM By: Dolan Amen RN Entered By: Dolan Amen on 01/25/2021 09:07:23 Chad [name] ([id number]) -------------------------------------------------------------------------------- Wound Assessment Details Patient Name: Chad [name] Date of Service: 01/25/2021 9:15 AM Medical Record Number: [id number] Patient Account Number: [id number] Date of Birth/Sex: 19-Jan-1961 (60 y.o. M) Treating RN: Dolan Amen Primary Care Anam Bobby: Clayborn Bigness Other Clinician: Referring Ahmar Pickrell: Clayborn Bigness Treating Auguste Tebbetts/Extender: Jeri Cos Weeks in Treatment: 23 Wound Status Wound Number: 8 Primary Venous Leg Ulcer Etiology: Wound Location: Left, Medial Ankle Wound Open Wounding Event: Gradually Appeared Status: Date Acquired: 07/12/2019 Comorbid Anemia, Chronic Obstructive Pulmonary Disease (COPD), Weeks Of Treatment: 23 History: Hepatitis C, Received Chemotherapy Clustered Wound: No Photos Wound Measurements Length: (cm) 2.5 Width: (cm) 1.3 Depth: (cm) 0.1 Area: (cm) 2.553 Volume: (cm) 0.255 % Reduction in Area: 56.7% % Reduction in Volume: 56.7% Epithelialization: Medium (34-66%) Tunneling: No Undermining: No Wound Description Classification: Full Thickness Without Exposed Support Structures Wound Margin: Flat and Intact Exudate Amount: Medium Exudate Type: Serosanguineous Exudate Color: red, brown Foul  Odor After Cleansing: No Slough/Fibrino Yes Wound Bed Granulation Amount: Large (67-100%) Exposed Structure Granulation Quality: Red, Pink Fascia Exposed: No Necrotic Amount: None Present (0%) Fat Layer (Subcutaneous Tissue) Exposed: Yes Tendon Exposed: No Muscle Exposed: No Joint Exposed: No Bone Exposed: No Treatment Notes Wound #8 (Ankle) Wound Laterality: Left, Medial Cleanser Normal  Saline Discharge Instruction: Wash your hands with soap and water. Remove old dressing, discard into plastic bag and place into trash. Cleanse the wound with Normal Saline prior to applying a clean dressing using gauze sponges, not tissues or cotton balls. Do not scrub or use excessive force. Pat dry using gauze sponges, not tissue or cotton balls. Chad [name], Chad [name] ([id number]) Peri-Wound Care Desitin Maximum Strength Ointment 4 (oz) Discharge Instruction: Zinc periwound Topical Primary Dressing Prisma 4.34 (in) Discharge Instruction: Moisten w/normal saline or sterile water; Cover wound as directed. Do not remove from wound bed. Secondary Dressing ABD Pad 5x9 (in/in) Discharge Instruction: Cover with ABD pad Kerlix 4.5 x 4.1 (in/yd) Discharge Instruction: Apply Kerlix 4.5 x 4.1 (in/yd) as instructed Secured With [place] Surgical Tape, 2x2 (in/yd) Discharge Instruction: Secure kerlix Compression Wrap Compression Stockings Add-Ons Electronic Signature(s) Signed: 01/25/2021 3:39:58 PM By: Dolan Amen RN Entered By: Dolan Amen on 01/25/2021 08:57:56 Chad [name] ([id number]) -------------------------------------------------------------------------------- [place] Details Patient Name: Chad [name] Date of Service: 01/25/2021 9:15 AM Medical Record Number: [id number] Patient Account Number: [id number] Date of Birth/Sex: November 01, 1960 (60 y.o. M) Treating RN: Dolan Amen Primary Care Vue Pavon: Clayborn Bigness Other Clinician: Referring Tonny Isensee: Clayborn Bigness Treating Erric Machnik/Extender: Skipper Cliche in Treatment: 23 Vital Signs Time Taken: 08:54 Temperature (F): 98.5 Height (in): 67 Pulse (bpm): 82 Weight (lbs): 160 Respiratory Rate (breaths/min): 18 Body Mass Index (BMI): 25.1 Blood Pressure (mmHg): 117/76 Reference Range: 80 - 120 mg / dl Electronic Signature(s) Signed: 01/25/2021 3:39:58 PM By: Dolan Amen RN Entered By: Dolan Amen on  01/25/2021 08:54:46

## 2021-01-25 NOTE — Progress Notes (Addendum)
Chad [name], Chad [name] ([id number]) Visit Report for 01/25/2021 Chief Complaint Document Details Patient Name: Chad [name] Date of Service: 01/25/2021 9:15 AM Medical Record Number: [id number] Patient Account Number: [id number] Date of Birth/Sex: 08/08/1960 (59 y.o. M) Treating RN: Carlene Coria Primary Care Provider: Clayborn Bigness Other Clinician: Referring Provider: Clayborn Bigness Treating Provider/Extender: Skipper Cliche in Treatment: 23 Information Obtained from: Patient Chief Complaint Left ankle and right foot and heel ulcers Electronic Signature(s) Signed: 01/25/2021 9:03:04 AM By: Worthy Keeler PA-C Entered By: Worthy Keeler on 01/25/2021 09:03:03 Chad [name] ([id number]) -------------------------------------------------------------------------------- HPI Details Patient Name: Chad [name] Date of Service: 01/25/2021 9:15 AM Medical Record Number: [id number] Patient Account Number: [id number] Date of Birth/Sex: 29-Oct-1960 (60 y.o. M) Treating RN: Carlene Coria Primary Care Provider: Clayborn Bigness Other Clinician: Referring Provider: Clayborn Bigness Treating Provider/Extender: Skipper Cliche in Treatment: 23 History of Present Illness HPI Description: 10/08/18 on evaluation today patient actually presents to our office for initial evaluation concerning wounds that he has of the bilateral lower extremities. 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 He also has bilateral venous stasis. He apparently has been homeless currently following discharge from the hospital apparently he has been placed at [facility] which is is a skilled nursing facility locally. Nonetheless fortunately he does not show any signs of infection at this time which is good news. In fact several of the wound actually appears to be showing some signs of  improvement already in my pinion. There are a couple areas in the left leg in particular there likely gonna require some sharp debridement to help clear away some necrotic tissue and help with more sufficient healing. No fevers, chills, nausea, or vomiting noted at this time. 10/15/18 on evaluation today patient actually appears to be doing very well in regard to his bilateral lower extremities. He's been tolerating the dressing changes without complication. Fortunately there does not appear to be any evidence of active infection at this time which is great news. Overall I'm actually very pleased with how this has progressed in just one visits time. Readmission: 08/14/2020 upon evaluation today patient presents for re-evaluation here in our clinic. He is having issues with his left ankle region as well as his right toe and his right heel. He tells me that the toe and heel actually began as a area that was itching that he was scratching and then subsequently opened up into wounds. These may have been abscess areas I presume based on what I am seeing currently. With regard to his left ankle region he tells me this was a similar type occurrence although he does have venous stasis this very well may be more of a venous leg ulcer more than anything. Nonetheless I do believe that the patient would benefit from appropriate and aggressive wound care to try to help get things under better control here. He does have history of a stroke on the left side affecting him to some degree there that he is able to stand although he does have some residual weakness. Otherwise again the patient does have chronic venous insufficiency as previously noted. His arterial studies most recently obtained showed that he had an ABI on the right of 1.16 with a TBI of 0.52 and on the left and ABI of 1.14 with a TBI of 0.81. That was obtained on 06/19/2020. 08/28/2020 upon evaluation today  patient appears to be doing decently well in  regard to his wounds in general. He has been tolerating the dressing changes without complication. Fortunately there does not appear to be any signs of active infection which is great news. With that being said I think the [facility] is doing a good job I would recommend that we likely continue with that currently. 09/11/2020 upon evaluation today patient's wounds did not appear to be doing too poorly but again he is not really showing signs of significant improvement with regard to any of the wounds on the right. None of them have Hydrofera Blue on them I am not exactly sure why this is not being followed as the facility did not contact us to let us know of any issues with obtaining dressings or otherwise. With that being said he is supposed to be using Hydrofera Blue on both of the wounds on the right foot as well as the ankle wound on the left side. 09/18/2020 upon evaluation today patient appears to be doing poorly with regard to his wounds. Again right now the left ankle in particular showed signs of extreme maceration. Apparently he was told by someone with staff at [facility] they could not get the [facility]. With that being said this is something that is never been relayed to [place] one way or another. Also the patient subsequently has not supposed to have a border gauze dressing on. He should have an ABD pad and roll gauze to secure as this drains much too much just to have a border gauze dressing to cover. Nonetheless the fact that they are not using the appropriate dressing is directly causing deterioration of the left ankle wound it is significantly worse today compared to what it was previous. I did attempt to call [facility] while the patient was here I called three times and got no one to even pick up the phone. After this I had my for an office coordinator call and she was able to finally get through and leave a message with the D ON as of dictation of this note  which is roughly about an hour and a half later I still have not been able to speak with anyone at the facility. 09/25/2020 upon evaluation today patient actually showing signs of good improvement which is excellent news. He has been tolerating the dressing changes without complication. Fortunately there is no signs of active infection which is great news. No fevers, chills, nausea, vomiting, or diarrhea. I do feel like the facility has been doing a much better job at taking care of him as far as the dressings are concerned. However the director of nursing never did call me back. 10/09/2020 upon evaluation today patient appears to be doing well with regard to his wound. The toe ulcer did require some debridement but the other 2 areas actually appear to be doing quite well. 10/19/2020 upon evaluation today patient actually appears to be doing very well in regard to his wounds. In fact the heel does appear to be completely healed. The toe is doing better in the medial ankle on the left is also doing better. Overall I think he is headed in the right direction. 10/26/2020 upon evaluation today patient appears to be doing well with regard to his wound. He is showing signs of improvement which is great news and overall I am very pleased with where things stand today. No fevers, chills, nausea, vomiting, or diarrhea. 11/02/2020 upon evaluation today patient appears to  be doing well with regard to his wounds. He has been tolerating the dressing changes without complication overall I am extremely pleased with where things stand today. He in regard to the toe is almost completely healed and the medial ankle on the left is doing much better. 11/09/2020 upon evaluation today patient appears to be doing a little poorly in regard to his left medial ankle ulcer. Fortunately there does not appear to be any signs of systemic infection but unfortunately locally he does appear to be infected in fact he has blue-green drainage  consistent with Pseudomonas. Chad [name], Chad [name] ([id number]) 11/16/2020 upon evaluation today patient appears to be doing well with regard to his wound. It actually appears to be doing better. I did place him on gentamicin cream since the Cipro was actually resistant even though he was positive for Pseudomonas on culture. Overall I think that he does seem to be doing better though I am unsure whether or not they have actually been putting the cream on. The patient is not sure that we did talk to the nurse directly and she was going to initiate that treatment. Fortunately there does not appear to be any signs of active infection at this time. No fevers, chills, nausea, vomiting, or diarrhea. 4/28; the area on the right second toe is close to healed. Left medial ankle required debridement 12/07/2020 upon evaluation today patient appears to be doing well with regard to his wounds. In fact the right second toe appears to be completely healed which is great news. Fortunately there does not appear to be any signs of active infection at this time which is also great news. I think we can probably discontinue the gentamicin on top of everything else. 12/14/2020 upon evaluation today patient appears to be doing well with regard to his wound. He is making good progress and overall very pleased with where things stand today. There is no signs of active infection at this time which is great news. 12/28/2020 upon evaluation today patient appears to be doing well with regard to his wounds. He has been tolerating the dressing changes without complication. Fortunately there is no signs of active infection at this time. No fevers, chills, nausea, vomiting, or diarrhea. 12/28/2020 upon evaluation today patient's wound bed actually showed signs of excellent improvement. He has great epithelization and granulation I do not see any signs of infection overall I am extremely pleased with where things stand at this point. No fevers,  chills, nausea, vomiting, or diarrhea. 01/11/2021 upon evaluation today patient appears to be doing well with regard to his wound on his leg. He has been tolerating the dressing changes without complication. Fortunately there does not appear to be any signs of active infection which is great news. No fevers, chills, nausea, vomiting, or diarrhea. 01/25/2021 upon evaluation today patient appears to be doing well with regard to his wound. He has been tolerating the dressing changes without complication. Fortunately the collagen seems to be doing a great job which is excellent news. No fevers, chills, nausea, vomiting, or diarrhea. Electronic Signature(s) Signed: 01/25/2021 9:14:18 AM By: Worthy Keeler PA-C Entered By: Worthy Keeler on 01/25/2021 09:14:17 Chad [name] ([id number]) -------------------------------------------------------------------------------- Physical Exam Details Patient Name: Chad [name] Date of Service: 01/25/2021 9:15 AM Medical Record Number: [id number] Patient Account Number: [id number] Date of Birth/Sex: 1960/12/17 (60 y.o. M) Treating RN: Carlene Coria Primary Care Provider: Clayborn Bigness Other Clinician: Referring Provider: Clayborn Bigness Treating Provider/Extender: Skipper Cliche in Treatment: 23 Constitutional Well-nourished and well-hydrated  in no acute distress. Respiratory normal breathing without difficulty. Psychiatric this patient is able to make decisions and demonstrates good insight into disease process. Alert and Oriented x 3. pleasant and cooperative. Notes Upon inspection patient's wound bed showed signs of good granulation epithelization at this point. There does not appear to be any evidence of active infection which is great news fortunately I feel like the patient is showing signs of excellent improvement currently. Electronic Signature(s) Signed: 01/25/2021 9:14:54 AM By: Worthy Keeler PA-C Entered By: Worthy Keeler on 01/25/2021  09:14:54 Chad [name] ([id number]) -------------------------------------------------------------------------------- Physician Orders Details Patient Name: Chad [name] Date of Service: 01/25/2021 9:15 AM Medical Record Number: [id number] Patient Account Number: [id number] Date of Birth/Sex: 09-30-1960 (60 y.o. 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 Bathing/ Shower/ Hygiene o May shower; gently cleanse wound with antibacterial soap, rinse and pat dry prior to dressing wounds Wound Treatment Wound #8 - Ankle Wound Laterality: Left, Medial Cleanser: Normal Saline 1 x Per Day/30 Days Discharge Instructions: Wash your hands with soap and water. Remove old dressing, discard into plastic bag and place into trash. Cleanse the wound with Normal Saline prior to applying a clean dressing using gauze sponges, not tissues or cotton balls. Do not scrub or use excessive force. Pat dry using gauze sponges, not tissue or cotton balls. Peri-Wound Care: Desitin Maximum Strength Ointment 4 (oz) 1 x Per Day/30 Days Discharge Instructions: Zinc periwound Primary Dressing: Prisma 4.34 (in) 1 x Per Day/30 Days Discharge Instructions: Moisten w/normal saline or sterile water; Cover wound as directed. Do not remove from wound bed. Secondary Dressing: ABD  Pad 5x9 (in/in) 1 x Per Day/30 Days Discharge Instructions: Cover with ABD pad Secondary Dressing: Kerlix 4.5 x 4.1 (in/yd) 1 x Per Day/30 Days Discharge Instructions: Apply Kerlix 4.5 x 4.1 (in/yd) as instructed Secured With: 34M Medipore H Soft Cloth Surgical Tape, 2x2 (in/yd) 1 x Per Day/30 Days Discharge Instructions: Secure kerlix Electronic Signature(s) Signed: 01/25/2021 3:39:58 PM By: Dolan Amen RN Signed: 01/25/2021 4:09:56 PM By: Worthy Keeler PA-C Entered By: Dolan Amen on 01/25/2021 09:06:53 Chad [name] ([id number]) -------------------------------------------------------------------------------- Problem List Details Patient Name: Chad [name] Date of Service: 01/25/2021 9:15 AM Medical Record Number: [id number] Patient Account Number: [id number] Date of Birth/Sex: 11/06/1960 (60 y.o. M) Treating RN: Carlene Coria Primary Care Provider: Clayborn Bigness Other Clinician: Referring Provider: Clayborn Bigness Treating Provider/Extender: Skipper Cliche in Treatment: 23 Active Problems ICD-10 Encounter Code Description Active Date MDM Diagnosis I87.2 Venous insufficiency (chronic) (peripheral) 08/14/2020 No Yes L97.322 Non-pressure chronic ulcer of left ankle with fat layer exposed 08/14/2020 No Yes L97.412 Non-pressure chronic ulcer of right heel and midfoot with fat layer 08/14/2020 No Yes exposed L97.512 Non-pressure chronic ulcer of other part of right foot with fat layer 08/14/2020 No Yes exposed 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 M) Treating RN: Carlene Coria Primary Care Provider: Clayborn Bigness Other Clinician: Referring Provider: Clayborn Bigness Treating Provider/Extender: Skipper Cliche in Treatment: 23 Subjective Chief Complaint Information obtained from Patient Left ankle and right foot and heel ulcers History of Present Illness (HPI) 10/08/18 on evaluation today patient actually presents to our office for initial evaluation concerning wounds that he has of the bilateral lower extremities. 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 He also has bilateral venous stasis. He apparently has been homeless currently following discharge from the hospital apparently he has been placed at [facility] which is is a skilled nursing facility locally. Nonetheless fortunately he does not show any signs of infection at this time which is good news. In fact several of the wound actually appears to be showing some signs of improvement already in my pinion. There are a couple areas in the left leg in particular there likely gonna require some sharp debridement to help clear away some necrotic tissue and help with more sufficient healing. No fevers, chills, nausea, or vomiting noted at this time. 10/15/18 on evaluation today patient actually appears to be doing very well in regard to his bilateral lower extremities. He's been tolerating the dressing changes without complication. Fortunately there does not appear to be any evidence of active infection at this time which is great news. Overall I'm actually very pleased with how this has progressed in just one visits time. Readmission: 08/14/2020 upon evaluation today patient presents for re-evaluation here in our clinic. He  is having issues with his left ankle region as well as his right toe and his right heel. He tells me that the toe and heel actually began as a area that was itching that he was scratching and then subsequently opened up into wounds. These may have been abscess areas I presume based on what I am seeing currently. With regard to his left ankle region he tells me this was a similar type occurrence although he does have venous stasis this very well may be more of a venous leg ulcer more than anything. Nonetheless I do believe that the patient would benefit from appropriate and aggressive wound care to try to help get things under better control here. He does have history of a stroke on the left side affecting him to some degree there that he is able to stand although he does have some residual weakness. Otherwise again the patient does have chronic venous insufficiency as previously noted. His arterial studies most recently obtained showed that he had an ABI on the right of 1.16 with a TBI of 0.52 and on the left and ABI of 1.14 with a TBI of 0.81. That was obtained on 06/19/2020. 08/28/2020 upon evaluation today patient appears to be doing decently well in regard to his wounds in general. He has been tolerating the dressing changes without complication. Fortunately there does not appear to be any signs of active infection which is great news. With that being said I think the [facility] is doing a good job I would recommend that we likely continue with that currently. 09/11/2020 upon evaluation today patient's  wounds did not appear to be doing too poorly but again he is not really showing signs of significant improvement with regard to any of the wounds on the right. None of them have Hydrofera Blue on them I am not exactly sure why this is not being followed as the facility did not contact us to let us know of any issues with obtaining dressings or otherwise. With that being said he is supposed to be  using Hydrofera Blue on both of the wounds on the right foot as well as the ankle wound on the left side. 09/18/2020 upon evaluation today patient appears to be doing poorly with regard to his wounds. Again right now the left ankle in particular showed signs of extreme maceration. Apparently he was told by someone with staff at [facility] they could not get the [facility]. With that being said this is something that is never been relayed to [place] one way or another. Also the patient subsequently has not supposed to have a border gauze dressing on. He should have an ABD pad and roll gauze to secure as this drains much too much just to have a border gauze dressing to cover. Nonetheless the fact that they are not using the appropriate dressing is directly causing deterioration of the left ankle wound it is significantly worse today compared to what it was previous. I did attempt to call [facility] while the patient was here I called three times and got no one to even pick up the phone. After this I had my for an office coordinator call and she was able to finally get through and leave a message with the D ON as of dictation of this note which is roughly about an hour and a half later I still have not been able to speak with anyone at the facility. 09/25/2020 upon evaluation today patient actually showing signs of good improvement which is excellent news. He has been tolerating the dressing changes without complication. Fortunately there is no signs of active infection which is great news. No fevers, chills, nausea, vomiting, or diarrhea. I do feel like the facility has been doing a much better job at taking care of him as far as the dressings are concerned. However the director of nursing never did call me back. 10/09/2020 upon evaluation today patient appears to be doing well with regard to his wound. The toe ulcer did require some debridement but the other 2 areas actually appear to be  doing quite well. 10/19/2020 upon evaluation today patient actually appears to be doing very well in regard to his wounds. In fact the heel does appear to be completely healed. The toe is doing better in the medial ankle on the left is also doing better. Overall I think he is headed in the right direction. 10/26/2020 upon evaluation today patient appears to be doing well with regard to his wound. He is showing signs of improvement which is great news and overall I am very pleased with where things stand today. No fevers, chills, nausea, vomiting, or diarrhea. 11/02/2020 upon evaluation today patient appears to be doing well with regard to his wounds. He has been tolerating the dressing changes without complication overall I am extremely pleased with where things stand today. He in regard to the toe is almost completely healed and the medial [facility] ([id number]) ankle on the left is doing much better. 11/09/2020 upon evaluation today patient appears to be doing a little poorly in regard to his left  medial ankle ulcer. Fortunately there does not appear to be any signs of systemic infection but unfortunately locally he does appear to be infected in fact he has blue-green drainage consistent with Pseudomonas. 11/16/2020 upon evaluation today patient appears to be doing well with regard to his wound. It actually appears to be doing better. I did place him on gentamicin cream since the Cipro was actually resistant even though he was positive for Pseudomonas on culture. Overall I think that he does seem to be doing better though I am unsure whether or not they have actually been putting the cream on. The patient is not sure that we did talk to the nurse directly and she was going to initiate that treatment. Fortunately there does not appear to be any signs of active infection at this time. No fevers, chills, nausea, vomiting, or diarrhea. 4/28; the area on the right second toe is close to healed. Left  medial ankle required debridement 12/07/2020 upon evaluation today patient appears to be doing well with regard to his wounds. In fact the right second toe appears to be completely healed which is great news. Fortunately there does not appear to be any signs of active infection at this time which is also great news. I think we can probably discontinue the gentamicin on top of everything else. 12/14/2020 upon evaluation today patient appears to be doing well with regard to his wound. He is making good progress and overall very pleased with where things stand today. There is no signs of active infection at this time which is great news. 12/28/2020 upon evaluation today patient appears to be doing well with regard to his wounds. He has been tolerating the dressing changes without complication. Fortunately there is no signs of active infection at this time. No fevers, chills, nausea, vomiting, or diarrhea. 12/28/2020 upon evaluation today patient's wound bed actually showed signs of excellent improvement. He has great epithelization and granulation I do not see any signs of infection overall I am extremely pleased with where things stand at this point. No fevers, chills, nausea, vomiting, or diarrhea. 01/11/2021 upon evaluation today patient appears to be doing well with regard to his wound on his leg. He has been tolerating the dressing changes without complication. Fortunately there does not appear to be any signs of active infection which is great news. No fevers, chills, nausea, vomiting, or diarrhea. 01/25/2021 upon evaluation today patient appears to be doing well with regard to his wound. He has been tolerating the dressing changes without complication. Fortunately the collagen seems to be doing a great job which is excellent news. No fevers, chills, nausea, vomiting, or diarrhea. Objective Constitutional Well-nourished and well-hydrated in no acute distress. Vitals Time Taken: 8:54 AM, Height: 67 in,  Weight: 160 lbs, BMI: 25.1, Temperature: 98.5 F, Pulse: 82 bpm, Respiratory Rate: 18 breaths/min, Blood Pressure: 117/76 mmHg. Respiratory normal breathing without difficulty. Psychiatric this patient is able to make decisions and demonstrates good insight into disease process. Alert and Oriented x 3. pleasant and cooperative. General Notes: Upon inspection patient's wound bed showed signs of good granulation epithelization at this point. There does not appear to be any evidence of active infection which is great news fortunately I feel like the patient is showing signs of excellent improvement currently. Integumentary (Hair, Skin) Wound #8 status is Open. Original cause of wound was Gradually Appeared. The date acquired was: 07/12/2019. The wound has been in treatment 23 weeks. The wound is located on the Left,Medial Ankle. The wound  measures 2.5cm length x 1.3cm width x 0.1cm depth; 2.553cm^2 area and 0.255cm^3 volume. There is Fat Layer (Subcutaneous Tissue) exposed. There is no tunneling or undermining noted. There is a medium amount of serosanguineous drainage noted. The wound margin is flat and intact. There is large (67-100%) red, pink granulation within the wound bed. There is no necrotic tissue within the wound bed. 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 Bathing/ Shower/ Hygiene: May shower; gently cleanse wound with antibacterial soap, rinse and pat dry prior to dressing wounds WOUND #8: - Ankle Wound Laterality: Left, Medial Cleanser: Normal  Saline 1 x Per Day/30 Days Discharge Instructions: Wash your hands with soap and water. Remove old dressing, discard into plastic bag and place into trash. Cleanse the wound with Normal Saline prior to applying a clean dressing using gauze sponges, not tissues or cotton balls. Do not scrub or use excessive force. Pat dry using gauze sponges, not tissue or cotton balls. Peri-Wound Care: Desitin Maximum Strength Ointment 4 (oz) 1 x Per Day/30 Days Discharge Instructions: Zinc periwound Primary Dressing: Prisma 4.34 (in) 1 x Per Day/30 Days Discharge Instructions: Moisten w/normal saline or sterile water; Cover wound as directed. Do not remove from wound bed. Secondary Dressing: ABD Pad 5x9 (in/in) 1 x Per Day/30 Days Discharge Instructions: Cover with ABD pad Secondary Dressing: Kerlix 4.5 x 4.1 (in/yd) 1 x Per Day/30 Days Discharge Instructions: Apply Kerlix 4.5 x 4.1 (in/yd) as instructed Secured With: 68M Medipore H Soft Cloth Surgical Tape, 2x2 (in/yd) 1 x Per Day/30 Days Discharge Instructions: Secure kerlix 1. Would recommend that we going continue with the wound care measures as before and the patient is in agreement with the plan. This includes the use of the silver collagen dressing which I think is doing a great job. 2. We will continue to cover this with an ABD pad. 3. I am also can recommend the patient continue to monitor for any signs of infection if anything occurs he should let me know. We will see patient back for reevaluation in 2 weeks here in the clinic. If anything worsens or changes patient will contact our office for additional recommendations. Electronic Signature(s) Signed: 01/25/2021 9:15:38 AM By: Worthy Keeler PA-C Entered By: Worthy Keeler on 01/25/2021 09:15:38 Chad [name] ([id number]) -------------------------------------------------------------------------------- SuperBill Details Patient Name: Chad [name] Date of Service: 01/25/2021 Medical Record  Number: [id number] Patient Account Number: [id number] Date of Birth/Sex: 31-Oct-1960 (60 y.o. M) Treating RN: Dolan Amen Primary Care Provider: Clayborn Bigness Other Clinician: Referring Provider: Clayborn Bigness Treating Provider/Extender: Skipper Cliche in Treatment: 23 Diagnosis Coding ICD-10 Codes Code Description I87.2 Venous insufficiency (chronic) (peripheral) L97.322 Non-pressure chronic ulcer of left ankle with fat layer exposed L97.412 Non-pressure chronic ulcer of right heel and midfoot with fat layer exposed L97.512 Non-pressure chronic ulcer of other part of right foot with fat layer exposed 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 side Facility Procedures CPT4 Code: 52778242 Description: 99213 - WOUND CARE VISIT-LEV 3 EST PT Modifier: Quantity: 1 Physician Procedures CPT4 Code: 3536144 Description: 99213 - WC PHYS LEVEL 3 - EST PT Modifier: Quantity: 1 CPT4 Code: Description: ICD-10 Diagnosis Description  I87.2 Venous insufficiency (chronic) (peripheral) L97.322 Non-pressure chronic ulcer of left ankle with fat layer exposed L97.412 Non-pressure chronic ulcer of right heel and midfoot with fat layer exp L97.512  Non-pressure chronic ulcer of other part of right foot with fat layer e Modifier: osed xposed Quantity: Electronic Signature(s) Signed: 01/25/2021 9:15:51 AM By: Worthy Keeler PA-C Entered By: Worthy Keeler on 01/25/2021 09:15:51

## 2021-01-30 ENCOUNTER — Inpatient Hospital Stay: Payer: Medicaid Other

## 2021-01-30 ENCOUNTER — Other Ambulatory Visit: Payer: Self-pay

## 2021-01-30 ENCOUNTER — Inpatient Hospital Stay (HOSPITAL_BASED_OUTPATIENT_CLINIC_OR_DEPARTMENT_OTHER): Payer: Medicaid Other | Admitting: Internal Medicine

## 2021-01-30 ENCOUNTER — Inpatient Hospital Stay: Payer: Medicaid Other | Attending: Internal Medicine

## 2021-01-30 DIAGNOSIS — Z5112 Encounter for antineoplastic immunotherapy: Secondary | ICD-10-CM | POA: Diagnosis not present

## 2021-01-30 DIAGNOSIS — C182 Malignant neoplasm of ascending colon: Secondary | ICD-10-CM

## 2021-01-30 DIAGNOSIS — C661 Malignant neoplasm of right ureter: Secondary | ICD-10-CM

## 2021-01-30 DIAGNOSIS — Z1509 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: Secondary | ICD-10-CM

## 2021-01-30 DIAGNOSIS — Z79899 Other long term (current) drug therapy: Secondary | ICD-10-CM | POA: Diagnosis not present

## 2021-01-30 DIAGNOSIS — Z7189 Other specified counseling: Secondary | ICD-10-CM

## 2021-01-30 DIAGNOSIS — D509 Iron deficiency anemia, unspecified: Secondary | ICD-10-CM | POA: Diagnosis not present

## 2021-01-30 DIAGNOSIS — Z515 Encounter for palliative care: Secondary | ICD-10-CM

## 2021-01-30 LAB — CBC WITH DIFFERENTIAL/PLATELET
Abs Immature Granulocytes: 0.02 10*3/uL (ref 0.00–0.07)
Basophils Absolute: 0.1 10*3/uL (ref 0.0–0.1)
Basophils Relative: 1 %
Eosinophils Absolute: 0.4 10*3/uL (ref 0.0–0.5)
Eosinophils Relative: 6 %
HCT: 38.8 % — ABNORMAL LOW (ref 39.0–52.0)
Hemoglobin: 12.2 g/dL — ABNORMAL LOW (ref 13.0–17.0)
Immature Granulocytes: 0 %
Lymphocytes Relative: 19 %
Lymphs Abs: 1.5 10*3/uL (ref 0.7–4.0)
MCH: 25 pg — ABNORMAL LOW (ref 26.0–34.0)
MCHC: 31.4 g/dL (ref 30.0–36.0)
MCV: 79.5 fL — ABNORMAL LOW (ref 80.0–100.0)
Monocytes Absolute: 0.6 10*3/uL (ref 0.1–1.0)
Monocytes Relative: 8 %
Neutro Abs: 5.1 10*3/uL (ref 1.7–7.7)
Neutrophils Relative %: 66 %
Platelets: 216 10*3/uL (ref 150–400)
RBC: 4.88 MIL/uL (ref 4.22–5.81)
RDW: 15.7 % — ABNORMAL HIGH (ref 11.5–15.5)
WBC: 7.7 10*3/uL (ref 4.0–10.5)
nRBC: 0 % (ref 0.0–0.2)

## 2021-01-30 LAB — COMPREHENSIVE METABOLIC PANEL
ALT: 23 U/L (ref 0–44)
AST: 35 U/L (ref 15–41)
Albumin: 3.8 g/dL (ref 3.5–5.0)
Alkaline Phosphatase: 113 U/L (ref 38–126)
Anion gap: 7 (ref 5–15)
BUN: 21 mg/dL (ref 8–23)
CO2: 25 mmol/L (ref 22–32)
Calcium: 8.7 mg/dL — ABNORMAL LOW (ref 8.9–10.3)
Chloride: 103 mmol/L (ref 98–111)
Creatinine, Ser: 0.81 mg/dL (ref 0.61–1.24)
GFR, Estimated: >60 mL/min (ref >60)
Glucose, Bld: 90 mg/dL (ref 70–99)
Potassium: 4.4 mmol/L (ref 3.5–5.1)
Sodium: 135 mmol/L (ref 135–145)
Total Bilirubin: 0.7 mg/dL (ref 0.3–1.2)
Total Protein: 7.6 g/dL (ref 6.5–8.1)

## 2021-01-30 MED ORDER — HEPARIN SOD (PORK) LOCK FLUSH 100 UNIT/ML IV SOLN
INTRAVENOUS | Status: AC
  Filled 2021-01-30: qty 5

## 2021-01-30 MED ORDER — SODIUM CHLORIDE 0.9% FLUSH
10.0000 mL | Freq: Once | INTRAVENOUS | Status: AC
Start: 2021-01-30 — End: 2021-01-30
  Administered 2021-01-30: 10 mL via INTRAVENOUS
  Filled 2021-01-30: qty 10

## 2021-01-30 MED ORDER — SODIUM CHLORIDE 0.9 % IV SOLN
200.0000 mg | Freq: Once | INTRAVENOUS | Status: AC
  Administered 2021-01-30: 200 mg via INTRAVENOUS
  Filled 2021-01-30: qty 8

## 2021-01-30 MED ORDER — SODIUM CHLORIDE 0.9 % IV SOLN
Freq: Once | INTRAVENOUS | Status: AC
  Filled 2021-01-30: qty 250

## 2021-01-30 MED ORDER — HEPARIN SOD (PORK) LOCK FLUSH 100 UNIT/ML IV SOLN
500.0000 [IU] | Freq: Once | INTRAVENOUS | Status: AC
  Administered 2021-01-30: 500 [IU] via INTRAVENOUS
  Filled 2021-01-30: qty 5

## 2021-01-30 NOTE — Patient Instructions (Signed)
[facility]  Discharge Instructions: Thank you for choosing [facility] to provide your oncology and hematology care.  If you have a lab appointment with the Cancer Center, please go directly to the Cancer Center and check in at the registration area.  Wear comfortable clothing and clothing appropriate for easy access to any Portacath or PICC line.   We strive to give you quality time with your provider. You may need to reschedule your appointment if you arrive late (15 or more minutes).  Arriving late affects you and other patients whose appointments are after yours.  Also, if you miss three or more appointments without notifying the office, you may be dismissed from the clinic at the provider's discretion.      For prescription refill requests, have your pharmacy contact our office and allow 72 hours for refills to be completed.    Today you received the following chemotherapy and/or immunotherapy agents : Keytruda    To help prevent nausea and vomiting after your treatment, we encourage you to take your nausea medication as directed.  BELOW ARE SYMPTOMS THAT SHOULD BE REPORTED IMMEDIATELY: *FEVER GREATER THAN 100.4 F (38 C) OR HIGHER *CHILLS OR SWEATING *NAUSEA AND VOMITING THAT IS NOT CONTROLLED WITH YOUR NAUSEA MEDICATION *UNUSUAL SHORTNESS OF BREATH *UNUSUAL BRUISING OR BLEEDING *URINARY PROBLEMS (pain or burning when urinating, or frequent urination) *BOWEL PROBLEMS (unusual diarrhea, constipation, pain near the anus) TENDERNESS IN MOUTH AND THROAT WITH OR WITHOUT PRESENCE OF ULCERS (sore throat, sores in mouth, or a toothache) UNUSUAL RASH, SWELLING OR PAIN  UNUSUAL VAGINAL DISCHARGE OR ITCHING   Items with * indicate a potential emergency and should be followed up as soon as possible or go to the Emergency Department if any problems should occur.  Please show the CHEMOTHERAPY ALERT CARD or IMMUNOTHERAPY ALERT CARD at check-in  to the Emergency Department and triage nurse.  Should you have questions after your visit or need to cancel or reschedule your appointment, please contact [facility]  [phone] and follow the prompts.  Office hours are 8:00 a.m. to 4:30 p.m. Monday - Friday. Please note that voicemails left after 4:00 p.m. may not be returned until the following business day.  We are closed weekends and major holidays. You have access to a nurse at all times for urgent questions. Please call the main number to the clinic [phone] and follow the prompts.  For any non-urgent questions, you may also contact your provider using MyChart. We now offer e-Visits for anyone 18 and older to request care online for non-urgent symptoms. For details visit [email or web address].   Also download the MyChart app! Go to the app store, search "MyChart", open the app, select [facility], and log in with your MyChart username and password.  Due to Covid, a mask is required upon entering the hospital/clinic. If you do not have a mask, one will be given to you upon arrival. For doctor visits, patients may have 1 support person aged 18 or older with them. For treatment visits, patients cannot have anyone with them due to current Covid guidelines and our immunocompromised population.  

## 2021-01-30 NOTE — Assessment & Plan Note (Signed)
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 JAN 5th, 2022- CT findings worrisome for progressive right renal pelvis and ureteral tumor and mass suspicious for colonic neoplasm involving the descending colon sigmoid colon junction region [see below].  Mild soft tissue thickening around the distal right ureter could reflect ureteral tumor; FEB 2022-  RIGHT renal pelvis and bladder are not evaluable by FDG PET imaging due to high activity of radiotracer within the urine-STABLE.  but see below re: sigmoid mass/colon cancer  # proceed with  [place] today; Labs today reviewed; Labs today reviewed;  acceptable for treatment today.   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 bil Arterial Dopp-wnl-STABLE.   #DISPOSITION: #  Keytruda; HOLD venofer today;  # follow up in 3 weeks-;MD labs-cbc/cmp;  Keytruda; - Dr.B.

## 2021-02-04 ENCOUNTER — Other Ambulatory Visit: Payer: Self-pay

## 2021-02-04 ENCOUNTER — Encounter: Payer: Self-pay | Admitting: Nurse Practitioner

## 2021-02-04 ENCOUNTER — Non-Acute Institutional Stay: Payer: Medicaid Other | Admitting: Nurse Practitioner

## 2021-02-04 VITALS — BP 117/67 | HR 67 | Temp 97.0°F | Resp 18 | Wt 160.0 lb

## 2021-02-04 DIAGNOSIS — Z515 Encounter for palliative care: Secondary | ICD-10-CM

## 2021-02-04 DIAGNOSIS — G894 Chronic pain syndrome: Secondary | ICD-10-CM

## 2021-02-04 DIAGNOSIS — R630 Anorexia: Secondary | ICD-10-CM

## 2021-02-04 NOTE — Progress Notes (Signed)
[facility] Consult Note Telephone: [phone]  Fax: [phone]    Date of encounter: 02/04/21 PATIENT NAME: Chad [name] [id number]   [phone] (home)  DOB: 1960/08/26 MRN: [id number] PRIMARY CARE PROVIDER:   Dr Lucianne Lei [facility]  RESPONSIBLE PARTY:    Contact Information     Name Relation Home Work [place], [place] Son   [phone]      I met face to face with patient in facility. Palliative Care was asked to follow this patient by consultation request of  Chad Nona Dell to address advance care planning and complex medical decision making. This is a follow up visit. ASSESSMENT AND PLAN / RECOMMENDATIONS:   Symptom Management/Plan: 1. ACP: DNR; treat what is treatable including hospitalizations if necessary   2.Anorexia secondary to protein calorie malnutrition related to cancer improving ongoing. Continue to monitor daily weights, supplements, supportive measures and courage to eat   3. Chronic pain monitor on pain scale, monitor efficacy vs adverse side effects.Will continue to monitor; encouraged to ask when in pain, Continue to follow by psychotherapy and psychiatry for counseling; supportive services. Continue with Oxycodone 45m q6 prn   4. Palliative care encounter; Palliative medicine team will continue to support patient, patient's family, and medical team. Visit consisted of counseling and education dealing with the complex and emotionally intense issues of symptom management and palliative care in the setting of serious and potentially life-threatening illness   Follow up Palliative Care Visit: Palliative care will continue to follow for complex medical decision making, advance care planning, and clarification of goals. Return 6 weeks or prn.  I spent 43 minutes providing this consultation. More than 50% of the time in this consultation was spent in  counseling and care coordination.  PPS: 50%  Chief Complaint: Follow up Palliative consult for complex medical decision making  HISTORY OF PRESENT ILLNESS:  Chad [name] a 60y.o. year old male  with multiple medical problems including   late onset CVA, urothelial cancer, prostate cancer, chronic hepatitis C, hyperlipidemia, colon surgery, protein calorie malnutrition, anxiety, depression. [name] Chad [name] himself to w/c, requires assistance for ADL's. Chad [name] himself with fair to declined appetite. Chad [name] to smoke daily. Followed by Psychiatry 12/27/2020 for recurrent depressive episode with h/o MDD prescribed escitalopram. No changes at this visit. 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 No recent falls, infections, hospitalization. 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 JAN 5th, 2022- CT findings worrisome for progressive right renal pelvis and ureteral tumor and mass suspicious for colonic neoplasm involving the descending colon sigmoid colon junction region [see below].  Mild soft tissue thickening around the distal right ureter could reflect ureteral tumor; FEB 2022-  RIGHT renal pelvis and bladder are not evaluable by FDG PET imaging due to high activity of radiotracer within the urine-STABLE.  but see below re: sigmoid mass/colon cancer. Staff [name] no new changes or concens. Current weight 160 lbs stable with BMI 25.1. I visited and  observed Chad [name]. Chad [name] was lying in bed, sleeping. Chad [name] awoke with verbal cues. We talked about purpose of PC, Chad [name] in agreement. We talked about how Chad [name] has been feeling. Chad [name] [name] he has been doing okay. We talked about recent visit with Chad Rogue [name], infusion. We talked about symptoms of pain. Chad [name] his pain has been controlled with oxycodone. We talked about no shortness of breath. We talked about appetite. We talked about foods he likes to eat, challenging with facility foods. We  talked about supplements and choices. We talked about Chad [name] daily routine at facility. We talked about mobility, Chad [name] able to maneuver himself in w/c. We talked about smoking cessation, Chad [name] declined. We talked about challenges residing at SNF with coping strategies. We talked about medical goals of care. We talked about f/u pc visit, will continue to monitor pain, appetite, weights. Wounds ongoing monitoring by wound care at facility. Therapeutic listening, emotional support provided. No changes to goc today. I updated staff.  11/09/2020 WBC 5.2, hemoglobin 11.3, hematocrit 34.7, platelets 166, sodium 138, potassium 4.2, chloride 103, co2 26, calcium 9.0, bun 14.9, creatinine 0.86, glucose 81  History obtained from review of EMR, discussion with facility staff and Chad [name].  I reviewed available labs, medications, imaging, studies and related documents from the EMR.  Records reviewed and summarized above.   ROS Full 14 system review of systems performed and negative with exception of: as per HPI.   Physical Exam: Constitutional: NAD General: frail appearing, chronically ill pleasant male EYES: lids intact ENMT: oral mucous membranes moist CV: S1S2, RRR, +BLE edema Pulmonary: LCTA, no increased work of breathing, no cough, room air Abdomen: soft and non tender MSK: w/c dependent Skin: warm and dry, wound multiple BLE Neuro:   + generalized weakness,  no cognitive impairment Psych: non-anxious affect, A and O x 3  Questions and concerns were addressed. The facility staff was encouraged to call with questions and/or concerns. Provided general support and encouragement, no other unmet needs identified   Thank you for the opportunity to participate in the care of Chad. Cogdell.  The palliative care team will continue to follow. Please call our office at [phone] if we can be of additional assistance.   This chart was dictated using voice recognition software.  Despite best efforts to proofread,  errors can occur which can change the documentation meaning.   Shawnise Peterkin Ihor Gully, NP

## 2021-02-05 ENCOUNTER — Encounter: Payer: Self-pay | Admitting: Internal Medicine

## 2021-02-05 NOTE — Progress Notes (Signed)
[facility] NOTE  Patient Care Team: [name], MD as PCP - General (Internal Medicine) Cammie Sickle, MD as Consulting Physician (Internal Medicine) Bary Castilla Forest Gleason, MD as Consulting Physician (General Surgery)  CHIEF COMPLAINTS/PURPOSE OF CONSULTATION: Urothelial cancer  #  Oncology History Overview Note  # SEP-OCT 2019-right renal pelvis/ ureteral [cytology positive HIGH grade urothelial carcinoma [Dr.Brandon]    # NOV 24th 2019-Keytruda [consent]  # April 2022- colonoscopy [Dr.Anna;incidental PET- sigmoid uptake] ~25 mm polypoid lesion-biopsy mucinous carcinoma- s/p Dr.Byrnett [facility] 2022]-repeat colonoscopy in 6 months [multiple comorbidities]  #Right ureteral obstruction status post stent placement  # JAN 2019- Right Colon ca [ T4N1]  [[facility]]; NO adjuvant therapy  # Hep C/ # stroke of left side/weakness-2018 Nov [[place]]; active smoker  DIAGNOSIS: # 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  Is alone.  Freemon Binford 60 y.o.  male above history of stage IV-ureteral cancer/history of stage III colon cancer right side; recurrent sigmoid colon cancer [un-resected/under surveillance] and multiple other comorbidities currently on Keytruda is here for follow-up.  Denies any worsening shortness of breath or cough.  Denies any worsening constipation or diarrhea.  No worsening abdominal pain.  No nausea no vomiting.  No blood in stools or black-colored stools.  Review of Systems  Constitutional:  Positive for malaise/fatigue.  Negative for chills, diaphoresis, fever and weight loss.  HENT:  Negative for nosebleeds and sore throat.   Eyes:  Negative for double vision.  Respiratory:  Positive for cough and shortness of breath. Negative for hemoptysis and wheezing.   Cardiovascular:  Negative for chest pain, palpitations and orthopnea.  Gastrointestinal:  Positive for constipation. Negative for abdominal pain, blood in stool, diarrhea, heartburn, melena, nausea and vomiting.  Genitourinary:  Negative for dysuria, frequency and urgency.  Musculoskeletal:  Positive for back pain and joint pain.  Skin: Negative.  Negative for itching and rash.  Neurological:  Positive for focal weakness. Negative for dizziness, tingling, weakness and headaches.       Chronic left-sided weakness upper than lower extremity.  Endo/Heme/Allergies:  Does not bruise/bleed easily.  Psychiatric/Behavioral:  Negative for depression. The patient is not nervous/anxious and does not have insomnia.     MEDICAL HISTORY:  Past Medical History:  Diagnosis Date  . Anemia   . Anxiety   . ARF (acute respiratory failure) ([place])   . Bladder cancer ([place])   . COPD (chronic obstructive pulmonary disease) ([place])   . Depression   . Dysphagia   . Family history of colon cancer   . Family history of kidney cancer   . Family history of leukemia   . Family history of prostate cancer   . GERD (gastroesophageal reflux disease)   . Hepatitis    chronic hep c  . Hydronephrosis   . Hydronephrosis with ureteral stricture   . Hyperlipidemia   . Knee pain    Left  . Malignant neoplasm of colon ([place])   . Nerve pain   . Peripheral vascular disease (  [place])   . Prostate cancer ([place])   . Stroke ([place])   . Urinary frequency   . Venous hypertension of both lower extremities     SURGICAL HISTORY: Past Surgical History:  Procedure Laterality Date  . COLON SURGERY     En bloc extended right hemicolectomy 07/2017  . COLONOSCOPY WITH PROPOFOL N/A 11/06/2020    Procedure: COLONOSCOPY WITH PROPOFOL;  Surgeon: Jonathon Bellows, MD;  Location: [facility] ENDOSCOPY;  Service: Gastroenterology;  Laterality: N/A;  . CYSTOSCOPY W/ RETROGRADES Right 08/30/2018   Procedure: CYSTOSCOPY WITH RETROGRADE PYELOGRAM;  Surgeon: Hollice Espy, MD;  Location: [facility] ORS;  Service: Urology;  Laterality: Right;  . CYSTOSCOPY WITH STENT PLACEMENT Right 04/25/2018   Procedure: CYSTOSCOPY WITH STENT PLACEMENT;  Surgeon: Hollice Espy, MD;  Location: [facility] ORS;  Service: Urology;  Laterality: Right;  . CYSTOSCOPY WITH STENT PLACEMENT Right 08/30/2018   Procedure: [place] WITH STENT Exchange;  Surgeon: Hollice Espy, MD;  Location: [facility] ORS;  Service: Urology;  Laterality: Right;  . CYSTOSCOPY WITH STENT PLACEMENT Right 03/07/2019   Procedure: CYSTOSCOPY WITH STENT Exchange;  Surgeon: Hollice Espy, MD;  Location: [facility] ORS;  Service: Urology;  Laterality: Right;  . CYSTOSCOPY WITH STENT PLACEMENT Right 11/21/2019   Procedure: CYSTOSCOPY WITH STENT Exchange;  Surgeon: Hollice Espy, MD;  Location: [facility] ORS;  Service: Urology;  Laterality: Right;  . LOWER EXTREMITY ANGIOGRAPHY Left 05/23/2019   Procedure: LOWER EXTREMITY ANGIOGRAPHY;  Surgeon: Algernon Huxley, MD;  Location: [place] CV LAB;  Service: Cardiovascular;  Laterality: Left;  . LOWER EXTREMITY ANGIOGRAPHY Right 05/30/2019   Procedure: LOWER EXTREMITY ANGIOGRAPHY;  Surgeon: Algernon Huxley, MD;  Location: [place] [facility] LAB;  Service: Cardiovascular;  Laterality: Right;  . LOWER EXTREMITY ANGIOGRAPHY Right 02/13/2020   Procedure: LOWER EXTREMITY ANGIOGRAPHY;  Surgeon: Algernon Huxley, MD;  Location: [place] [facility];  Service: Cardiovascular;  Laterality: Right;  . LOWER EXTREMITY ANGIOGRAPHY Left 02/20/2020   Procedure: LOWER EXTREMITY ANGIOGRAPHY;  Surgeon: Algernon Huxley, MD;  Location: [place] CV LAB;  Service: Cardiovascular;  Laterality: Left;  . PORTA CATH INSERTION N/A 02/28/2019   Procedure: PORTA CATH INSERTION;   Surgeon: Algernon Huxley, MD;  Location: [place] [facility] LAB;  Service: Cardiovascular;  Laterality: N/A;  . tumor removed       SOCIAL HISTORY: Social History   Socioeconomic History  . Marital status: Single    Spouse name: Not on file  . Number of children: Not on file  . Years of education: Not on file  . Highest education level: Not on file  Occupational History  . Not on file  Tobacco Use  . Smoking status: Every Day    Packs/day: 1.00    Pack years: 0.00    Types: Cigarettes  . Smokeless tobacco: Never  Vaping Use  . Vaping Use: Never used  Substance and Sexual Activity  . Alcohol use: Not Currently  . Drug use: Not Currently  . Sexual activity: Not Currently  Other Topics Concern  . Not on file  Social History Narrative    used to live [place]; moved  To [place]- end of April 2019; in Nursing home; 1pp/day; quit alcohol. 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        Family- dad- prostate ca [at 67y]; brother- 3 died of prostate cancer; brother- 29- no cancers [[place]]; sonGerald Stabs [[place]];[name]-32y prostate ca [facility] [place]]; daughter- 3 [[place]]; another daughter 32 [[place]/addict]. will refer genetics counseling. Given MSI- abnormal; highly  suspicious of Lynch syndrome.  Patient's son [name] aware of high possible lynch syndrome.   Social Determinants of Health   Financial Resource Strain: Not on file  Food Insecurity: Not on file  Transportation Needs: Not on file  Physical Activity: Not on file  Stress: Not on file  Social Connections: Not on file  Intimate Partner Violence: Not on file    FAMILY HISTORY: Family History  Problem Relation Age of Onset  . Prostate cancer Father 98  . Cancer Brother 54       unsure type  . Cancer Paternal Uncle        unsure type  . Cancer Maternal Grandmother        unsure type  . Cancer Paternal Grandmother        unsure type  . Kidney cancer Paternal Grandfather   . Cancer Other        unsure types  . Leukemia Son   . Cancer Son         other cancers, possibly colon    ALLERGIES:  is allergic to penicillins.  MEDICATIONS:  Current Outpatient Medications  Medication Sig Dispense Refill  . acetaminophen (TYLENOL) 325 MG tablet Take 650 mg by mouth 3 (three) times daily.     Marland Kitchen aspirin EC 81 MG tablet Take 1 tablet (81 mg total) by mouth daily. 150 tablet 2  . atorvastatin (LIPITOR) 10 MG tablet Take 1 tablet (10 mg total) by mouth daily. 30 tablet 11  . Cholecalciferol (VITAMIN D) 50 MCG (2000 UT) CAPS Take by mouth.    . clopidogrel (PLAVIX) 75 MG tablet Take 1 tablet (75 mg total) by mouth daily. 30 tablet 11  . cyclobenzaprine (FLEXERIL) 10 MG tablet Take 10 mg by mouth at bedtime.    Marland Kitchen escitalopram (LEXAPRO) 5 MG tablet Take 5 mg by mouth daily.    Marland Kitchen gabapentin (NEURONTIN) 100 MG capsule Take 100 mg by mouth 3 (three) times daily.    . hydroxypropyl methylcellulose / hypromellose (ISOPTO TEARS / GONIOVISC) 2.5 % ophthalmic solution Place 1 drop into both eyes every 12 (twelve) hours.    . mirtazapine (REMERON) 7.5 MG tablet Take 7.5 mg by mouth at bedtime.    . Oxycodone HCl 10 MG TABS Take 10 mg by mouth every 6 (six) hours as needed for pain.    . pantoprazole (PROTONIX) 40 MG tablet Take 40 mg by mouth daily.    . Pollen Extracts (PROSTAT PO) Take 30 mLs by mouth.    . polyvinyl alcohol (LIQUIFILM TEARS) 1.4 % ophthalmic solution 1 gtt both eyes every 12 hours    . pravastatin (PRAVACHOL) 10 MG tablet Take 10 mg by mouth daily.    . RESTORE CALCIUM ALGINATE EX Apply topically. Note: Dressing located on left ankle. Facility cleaning wound "every day shift and applying wound cleanser and appication of calcium Alginate and cover with dry dressing and wrapped with Kerlix"     No current facility-administered medications for this visit.      Marland Kitchen  PHYSICAL EXAMINATION: ECOG PERFORMANCE STATUS: 1 - Symptomatic but completely ambulatory  Vitals:   01/30/21 0834  BP: 110/76  Pulse: 80  Resp: 18  Temp: (!) 96.1  F (35.6 C)   Filed Weights   01/30/21 0834  Weight: 151 lb (68.5 kg)    Physical Exam Constitutional:      Comments: In wheelchair. Cachectic.  Appears older than his stated age.  Alone.  HENT:     Head:  Normocephalic and atraumatic.     Mouth/Throat:     Pharynx: No oropharyngeal exudate.  Eyes:     Pupils: Pupils are equal, round, and reactive to light.  Cardiovascular:     Rate and Rhythm: Normal rate and regular rhythm.  Pulmonary:     Effort: No respiratory distress.     Breath sounds: No wheezing.  Abdominal:     General: Bowel sounds are normal. There is no distension.     Palpations: Abdomen is soft. There is no mass.     Tenderness: There is no abdominal tenderness. There is no guarding or rebound.  Musculoskeletal:        General: No tenderness. Normal range of motion.     Cervical back: Normal range of motion and neck supple.  Skin:    General: Skin is warm.     Comments: Bilateral lower extremity ulcerations noted.  Pulses intact bilaterally.  Neurological:     Mental Status: He is oriented to person, place, and time.     Comments: Sleepy but easily arousable.  Chronic weakness left upper and lower extremity.  Psychiatric:        Mood and Affect: Affect normal.     LABORATORY DATA:  I have reviewed the data as listed Lab Results  Component Value Date   WBC 7.7 01/30/2021   HGB 12.2 (L) 01/30/2021   HCT 38.8 (L) 01/30/2021   MCV 79.5 (L) 01/30/2021   PLT 216 01/30/2021   Recent Labs    03/08/20 0905 03/29/20 0824 04/19/20 0858 05/10/20 0915 12/17/20 0858 01/07/21 0823 01/30/21 0815  NA 137 139 134*   < > 136 135 135  K 4.4 4.1 3.8   < > 4.0 4.3 4.4  CL 103 104 101   < > 102 101 103  CO2 _0 < > _1 GLUCOSE 88 94 140*   < > 82 87 90  BUN _2 < > 25* 19 21  CREATININE 0.85 0.76 0.92   < > 0.96 0.82 0.81  CALCIUM 8.8* 8.6* 8.4*   < > 8.6* 8.5* 8.7*  GFRNONAA >60 >60 >60   < > >60 >60 >60  GFRAA >60 >60 >60  --   --   --    --   PROT 8.0 7.7 7.8   < > 7.8 7.6 7.6  ALBUMIN 3.8 3.6 3.7   < > 3.8 3.7 3.8  AST 32 28 33   < > 30 32 35  ALT _3 < > _4 ALKPHOS 127* 118 115   < > 100 94 113  BILITOT 0.6 0.4 0.4   < > 0.6 0.4 0.7   < > = values in this interval not displayed.    RADIOGRAPHIC STUDIES: I have personally reviewed the radiological images as listed and agreed with the findings in the report. No results found.  ASSESSMENT & PLAN:   Ureteral cancer, right ([place]) # High-grade urothelial cancer/cytology; likely of the right renal pelvis /upper ureter. JAN 5th, 2022- CT findings worrisome for progressive right renal pelvis and ureteral tumor and mass suspicious for colonic neoplasm involving the descending colon sigmoid colon junction region [see below].  Mild soft tissue thickening around the distal right ureter could reflect ureteral tumor; FEB 2022-  RIGHT renal pelvis and bladder are not evaluable by FDG PET imaging due to high activity of radiotracer within the urine-STABLE.  but  see below re: sigmoid mass/colon cancer  # proceed with  [place] today; Labs today reviewed; Labs today reviewed;  acceptable for treatment today.   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 bil Arterial Dopp-wnl-STABLE.   #DISPOSITION: #  Keytruda; HOLD venofer today;  # follow up in 3 weeks-;MD labs-cbc/cmp;  Keytruda; - Dr.B.   All questions were answered. The patient knows to call the clinic with any problems, questions or concerns.    Cammie Sickle, MD 02/05/2021 9:31 PM

## 2021-02-08 ENCOUNTER — Encounter: Payer: Medicaid Other | Admitting: Physician Assistant

## 2021-02-08 ENCOUNTER — Other Ambulatory Visit: Payer: Self-pay

## 2021-02-08 DIAGNOSIS — L97322 Non-pressure chronic ulcer of left ankle with fat layer exposed: Secondary | ICD-10-CM | POA: Diagnosis not present

## 2021-02-08 NOTE — Progress Notes (Addendum)
Chad, [name] ([id number]) Visit Report for 02/08/2021 Chief Complaint Document Details Patient Name: Chad [name], Chad [name] Date of Service: 02/08/2021 9:15 AM Medical Record Number: [id number] Patient Account Number: [id number] Date of Birth/Sex: April 21, 1961 (60 y.o. M) Treating RN: Carlene Coria Primary Care Provider: Clayborn Bigness Other Clinician: Referring Provider: Clayborn Bigness Treating Provider/Extender: Skipper Cliche in Treatment: 25 Information Obtained from: Patient Chief Complaint Left ankle and right foot and heel ulcers Electronic Signature(s) Signed: 02/08/2021 9:17:54 AM By: Worthy Keeler PA-C Entered By: Worthy Keeler on 02/08/2021 09:17:54 Chad [name] ([id number]) -------------------------------------------------------------------------------- Debridement Details Patient Name: Chad [name] Date of Service: 02/08/2021 9:15 AM Medical Record Number: [id number] Patient Account Number: [id number] Date of Birth/Sex: 1961-07-18 (60 y.o. M) Treating RN: Dolan Amen Primary Care Provider: Clayborn Bigness Other Clinician: Referring Provider: Clayborn Bigness Treating Provider/Extender: Skipper Cliche in Treatment: 25 Debridement Performed for Wound #8 Left,Medial Ankle Assessment: Performed By: Physician Tommie Sams., PA-C Debridement Type: Debridement Severity of Tissue Pre Debridement: Fat layer exposed Level of Consciousness (Pre- Awake and Alert procedure): Pre-procedure Verification/Time Out Yes - 09:30 Taken: Start Time: 09:30 Total Area Debrided (L x W): 2.6 (cm) x 1.6 (cm) = 4.16 (cm) Tissue and other material Viable, Non-Viable, Slough, Subcutaneous, Biofilm, Slough debrided: Level: Skin/Subcutaneous Tissue Debridement Description: Excisional Instrument: Curette Bleeding: Minimum Hemostasis Achieved: Pressure Response to Treatment: Procedure was tolerated well Level of Consciousness (Post- Awake and Alert procedure): Post Debridement Measurements  of Total Wound Length: (cm) 2.6 Width: (cm) 1.6 Depth: (cm) 0.2 Volume: (cm) 0.653 Character of Wound/Ulcer Post Debridement: Stable Severity of Tissue Post Debridement: Fat layer exposed Post Procedure Diagnosis Same as Pre-procedure Electronic Signature(s) Signed: 02/08/2021 4:02:25 PM By: Dolan Amen RN Signed: 02/08/2021 4:42:29 PM By: Worthy Keeler PA-C Entered By: Dolan Amen on 02/08/2021 09:35:31 Chad [name] ([id number]) -------------------------------------------------------------------------------- HPI Details Patient Name: Chad [name] Date of Service: 02/08/2021 9:15 AM Medical Record Number: [id number] Patient Account Number: [id number] Date of Birth/Sex: [birth year] (60 y.o. M) Treating RN: Carlene Coria Primary Care Provider: Clayborn Bigness Other Clinician: Referring Provider: Clayborn Bigness Treating Provider/Extender: Skipper Cliche in Treatment: 25 History of Present Illness HPI Description: 10/08/18 on evaluation today patient actually presents to our office for initial evaluation concerning wounds that he has of the bilateral lower extremities. 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 He also has bilateral venous stasis. He apparently has been homeless currently following discharge from the hospital apparently he has been placed at [facility] which is is a skilled nursing facility locally. Nonetheless fortunately he does not show any signs of infection at this time which is good news. In fact several of the wound actually appears to be showing some signs of improvement already in my pinion. There are a couple areas in the left leg in particular there likely gonna require some sharp debridement to help clear away some necrotic tissue and help with more sufficient healing. No fevers, chills,  nausea, or vomiting noted at this time. 10/15/18 on evaluation today patient actually appears to be doing very well in regard to his bilateral lower extremities. He's been tolerating the dressing changes without complication. Fortunately there does not appear to be any evidence of active infection at this time which is great news. Overall I'm actually very pleased with how this has progressed in just one visits time. Readmission: 08/14/2020 upon  evaluation today patient presents for re-evaluation here in our clinic. He is having issues with his left ankle region as well as his right toe and his right heel. He tells me that the toe and heel actually began as a area that was itching that he was scratching and then subsequently opened up into wounds. These may have been abscess areas I presume based on what I am seeing currently. With regard to his left ankle region he tells me this was a similar type occurrence although he does have venous stasis this very well may be more of a venous leg ulcer more than anything. Nonetheless I do believe that the patient would benefit from appropriate and aggressive wound care to try to help get things under better control here. He does have history of a stroke on the left side affecting him to some degree there that he is able to stand although he does have some residual weakness. Otherwise again the patient does have chronic venous insufficiency as previously noted. His arterial studies most recently obtained showed that he had an ABI on the right of 1.16 with a TBI of 0.52 and on the left and ABI of 1.14 with a TBI of 0.81. That was obtained on 06/19/2020. 08/28/2020 upon evaluation today patient appears to be doing decently well in regard to his wounds in general. He has been tolerating the dressing changes without complication. Fortunately there does not appear to be any signs of active infection which is great news. With that being said I think the [facility]  is doing a good job I would recommend that we likely continue with that currently. 09/11/2020 upon evaluation today patient's wounds did not appear to be doing too poorly but again he is not really showing signs of significant improvement with regard to any of the wounds on the right. None of them have Hydrofera Blue on them I am not exactly sure why this is not being followed as the facility did not contact us to let us know of any issues with obtaining dressings or otherwise. With that being said he is supposed to be using Hydrofera Blue on both of the wounds on the right foot as well as the ankle wound on the left side. 09/18/2020 upon evaluation today patient appears to be doing poorly with regard to his wounds. Again right now the left ankle in particular showed signs of extreme maceration. Apparently he was told by someone with staff at [facility] they could not get the [facility]. With that being said this is something that is never been relayed to [place] one way or another. Also the patient subsequently has not supposed to have a border gauze dressing on. He should have an ABD pad and roll gauze to secure as this drains much too much just to have a border gauze dressing to cover. Nonetheless the fact that they are not using the appropriate dressing is directly causing deterioration of the left ankle wound it is significantly worse today compared to what it was previous. I did attempt to call [name] while the patient was here I called three times and got no one to even pick up the phone. After this I had my for an office coordinator call and she was able to finally get through and leave a message with the D ON as of dictation of this note which is roughly about an hour and a half later I still have not been able to speak with anyone at  the facility. 09/25/2020 upon evaluation today patient actually showing signs of good improvement which is excellent news. He has been  tolerating the dressing changes without complication. Fortunately there is no signs of active infection which is great news. No fevers, chills, nausea, vomiting, or diarrhea. I do feel like the facility has been doing a much better job at taking care of him as far as the dressings are concerned. However the director of nursing never did call me back. 10/09/2020 upon evaluation today patient appears to be doing well with regard to his wound. The toe ulcer did require some debridement but the other 2 areas actually appear to be doing quite well. 10/19/2020 upon evaluation today patient actually appears to be doing very well in regard to his wounds. In fact the heel does appear to be completely healed. The toe is doing better in the medial ankle on the left is also doing better. Overall I think he is headed in the right direction. 10/26/2020 upon evaluation today patient appears to be doing well with regard to his wound. He is showing signs of improvement which is great news and overall I am very pleased with where things stand today. No fevers, chills, nausea, vomiting, or diarrhea. 11/02/2020 upon evaluation today patient appears to be doing well with regard to his wounds. He has been tolerating the dressing changes without complication overall I am extremely pleased with where things stand today. He in regard to the toe is almost completely healed and the medial ankle on the left is doing much better. 11/09/2020 upon evaluation today patient appears to be doing a little poorly in regard to his left medial ankle ulcer. Fortunately there does not appear to be any signs of systemic infection but unfortunately locally he does appear to be infected in fact he has blue-green drainage consistent with Pseudomonas. LUNDEN, MCLEISH ([id number]) 11/16/2020 upon evaluation today patient appears to be doing well with regard to his wound. It actually appears to be doing better. I did place him on gentamicin cream since  the Cipro was actually resistant even though he was positive for Pseudomonas on culture. Overall I think that he does seem to be doing better though I am unsure whether or not they have actually been putting the cream on. The patient is not sure that we did talk to the nurse directly and she was going to initiate that treatment. Fortunately there does not appear to be any signs of active infection at this time. No fevers, chills, nausea, vomiting, or diarrhea. 4/28; the area on the right second toe is close to healed. Left medial ankle required debridement 12/07/2020 upon evaluation today patient appears to be doing well with regard to his wounds. In fact the right second toe appears to be completely healed which is great news. Fortunately there does not appear to be any signs of active infection at this time which is also great news. I think we can probably discontinue the gentamicin on top of everything else. 12/14/2020 upon evaluation today patient appears to be doing well with regard to his wound. He is making good progress and overall very pleased with where things stand today. There is no signs of active infection at this time which is great news. 12/28/2020 upon evaluation today patient appears to be doing well with regard to his wounds. He has been tolerating the dressing changes without complication. Fortunately there is no signs of active infection at this time. No fevers, chills, nausea, vomiting, or diarrhea. 12/28/2020 upon  evaluation today patient's wound bed actually showed signs of excellent improvement. He has great epithelization and granulation I do not see any signs of infection overall I am extremely pleased with where things stand at this point. No fevers, chills, nausea, vomiting, or diarrhea. 01/11/2021 upon evaluation today patient appears to be doing well with regard to his wound on his leg. He has been tolerating the dressing changes without complication. Fortunately there does  not appear to be any signs of active infection which is great news. No fevers, chills, nausea, vomiting, or diarrhea. 01/25/2021 upon evaluation today patient appears to be doing well with regard to his wound. He has been tolerating the dressing changes without complication. Fortunately the collagen seems to be doing a great job which is excellent news. No fevers, chills, nausea, vomiting, or diarrhea. 02/08/2021 upon evaluation today patient's wound is actually looking a little bit worse especially in the periwound compared to previous. Fortunately there does not appear to be any signs of infection which is great news with that being said he does have some irritation around the periphery of the wound which has me more concerned. He actually had a dressing on that had not been changed in 3 days. He also is supposed to have daily dressing changes. With regard to the dressing applied he had a silver alginate dressing and silver collagen is what is recommended and ordered. He also had no Desitin around the edges of the wound in the periwound region although that is on the order inspect to be done as well. In general I was very concerned I did contact [facility] actually spoke with Chad [name] who is the scheduling individual and subsequently she stated that she would pass the information to the D ON apparently the D ON was not available to talk to me when I call today. Electronic Signature(s) Signed: 02/08/2021 10:22:30 AM By: Worthy Keeler PA-C Entered By: Worthy Keeler on 02/08/2021 10:22:30 Chad [name] ([id number]) -------------------------------------------------------------------------------- Physical Exam Details Patient Name: Chad [name] Date of Service: 02/08/2021 9:15 AM Medical Record Number: [id number] Patient Account Number: [id number] Date of Birth/Sex: 15-Jul-1961 (60 y.o. M) Treating RN: Carlene Coria Primary Care Provider: Clayborn Bigness Other Clinician: Referring Provider:  Clayborn Bigness Treating Provider/Extender: Skipper Cliche in Treatment: 70 Constitutional Well-nourished and well-hydrated in no acute distress. Respiratory normal breathing without difficulty. Psychiatric this patient is able to make decisions and demonstrates good insight into disease process. Alert and Oriented x 3. pleasant and cooperative. Notes Upon inspection patient's wound bed actually showed signs of fairly good granulation with some biofilm and slough noted on the surface of the wound I did perform sharp debridement to clear this away today. He tolerated that without complication minimal bleeding noted hemostasis achieved by pressure. Electronic Signature(s) Signed: 02/08/2021 10:23:15 AM By: Worthy Keeler PA-C Entered By: Worthy Keeler on 02/08/2021 10:23:14 Chad [name] ([id number]) -------------------------------------------------------------------------------- Physician Orders Details Patient Name: Chad [name] Date of Service: 02/08/2021 9:15 AM Medical Record Number: [id number] Patient Account Number: [id number] Date of Birth/Sex: July 12, 1961 (60 y.o. 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 Bathing/ Shower/ Hygiene o May shower; gently cleanse wound with antibacterial soap, rinse and pat dry prior to  dressing wounds Wound Treatment Wound #8 - Ankle Wound Laterality: Left, Medial Cleanser: Normal Saline 1 x Per Day/30 Days Discharge Instructions: Wash your hands with soap and water. Remove old dressing, discard into plastic bag and place into trash. Cleanse the wound with Normal Saline prior to applying a clean dressing using gauze sponges, not tissues or cotton balls. Do not scrub or use excessive force. Pat dry using gauze sponges, not tissue or cotton balls. Peri-Wound Care: Desitin Maximum Strength Ointment 4 (oz) 1 x Per Day/30 Days Discharge Instructions: Zinc periwound Primary Dressing: Prisma 4.34 (in) 1 x Per Day/30 Days Discharge Instructions: Moisten w/normal saline or sterile water; Cover wound as directed. Do not remove from wound bed. Secondary Dressing: ABD Pad 5x9 (in/in) 1 x Per Day/30 Days Discharge Instructions: Cover with ABD pad Secondary Dressing: Kerlix 4.5 x 4.1 (in/yd) 1 x Per Day/30 Days Discharge Instructions: Apply Kerlix 4.5 x 4.1 (in/yd) as instructed Secured With: 61M Medipore H Soft Cloth Surgical Tape, 2x2 (in/yd) 1 x Per Day/30 Days Discharge Instructions: Secure kerlix Electronic Signature(s) Signed: 02/08/2021 4:02:25 PM By: Dolan Amen RN Signed: 02/08/2021 4:42:29 PM By: Worthy Keeler PA-C Entered By: Dolan Amen on 02/08/2021 09:35:58 Chad [name] ([id number]) -------------------------------------------------------------------------------- Problem List Details Patient Name: Chad [name] Date of Service: 02/08/2021 9:15 AM Medical Record Number: [id number] Patient Account Number: [id number] Date of Birth/Sex: 12/19/1960 (60 y.o. M) Treating RN: Carlene Coria Primary Care Provider: Clayborn Bigness Other Clinician: Referring Provider: Clayborn Bigness Treating Provider/Extender: Skipper Cliche in Treatment: 25 Active Problems ICD-10 Encounter Code Description Active Date MDM Diagnosis I87.2 Venous insufficiency (chronic) (peripheral)  08/14/2020 No Yes L97.322 Non-pressure chronic ulcer of left ankle with fat layer exposed 08/14/2020 No Yes L97.412 Non-pressure chronic ulcer of right heel and midfoot with fat layer 08/14/2020 No Yes exposed L97.512 Non-pressure chronic ulcer of other part of right foot with fat layer 08/14/2020 No Yes exposed 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 M) Treating RN: Carlene Coria Primary Care Provider: Clayborn Bigness Other Clinician: Referring Provider: Clayborn Bigness Treating Provider/Extender: Skipper Cliche in Treatment: 25 Subjective Chief Complaint Information obtained from Patient Left ankle and right foot and heel ulcers History of Present Illness (HPI) 10/08/18 on evaluation today patient actually presents to our office for initial evaluation concerning wounds that he has of the bilateral lower extremities. 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 He also has bilateral venous stasis. He apparently has been homeless currently following discharge from the hospital apparently he has been placed at [facility] which is is a skilled nursing facility locally. Nonetheless fortunately he does not show any signs of infection at this time which is good news. In fact several of the wound  actually appears to be showing some signs  of improvement already in my pinion. There are a couple areas in the left leg in particular there likely gonna require some sharp debridement to help clear away some necrotic tissue and help with more sufficient healing. No fevers, chills, nausea, or vomiting noted at this time. 10/15/18 on evaluation today patient actually appears to be doing very well in regard to his bilateral lower extremities. He's been tolerating the dressing changes without complication. Fortunately there does not appear to be any evidence of active infection at this time which is great news. Overall I'm actually very pleased with how this has progressed in just one visits time. Readmission: 08/14/2020 upon evaluation today patient presents for re-evaluation here in our clinic. He is having issues with his left ankle region as well as his right toe and his right heel. He tells me that the toe and heel actually began as a area that was itching that he was scratching and then subsequently opened up into wounds. These may have been abscess areas I presume based on what I am seeing currently. With regard to his left ankle region he tells me this was a similar type occurrence although he does have venous stasis this very well may be more of a venous leg ulcer more than anything. Nonetheless I do believe that the patient would benefit from appropriate and aggressive wound care to try to help get things under better control here. He does have history of a stroke on the left side affecting him to some degree there that he is able to stand although he does have some residual weakness. Otherwise again the patient does have chronic venous insufficiency as previously noted. His arterial studies most recently obtained showed that he had an ABI on the right of 1.16 with a TBI of 0.52 and on the left and ABI of 1.14 with a TBI of 0.81. That was obtained on 06/19/2020. 08/28/2020 upon evaluation today  patient appears to be doing decently well in regard to his wounds in general. He has been tolerating the dressing changes without complication. Fortunately there does not appear to be any signs of active infection which is great news. With that being said I think the [facility] is doing a good job I would recommend that we likely continue with that currently. 09/11/2020 upon evaluation today patient's wounds did not appear to be doing too poorly but again he is not really showing signs of significant improvement with regard to any of the wounds on the right. None of them have Hydrofera Blue on them I am not exactly sure why this is not being followed as the facility did not contact us to let us know of any issues with obtaining dressings or otherwise. With that being said he is supposed to be using Hydrofera Blue on both of the wounds on the right foot as well as the ankle wound on the left side. 09/18/2020 upon evaluation today patient appears to be doing poorly with regard to his wounds. Again right now the left ankle in particular showed signs of extreme maceration. Apparently he was told by someone with staff at [facility] they could not get the [facility]. With that being said this is something that is never been relayed to [place] one way or another. Also the patient subsequently has not supposed to have a border gauze dressing on. He should have an ABD pad and roll gauze to secure as this drains much too much just to have a border gauze dressing to  cover. Nonetheless the fact that they are not using the appropriate dressing is directly causing deterioration of the left ankle wound it is significantly worse today compared to what it was previous. I did attempt to call [facility] while the patient was here I called three times and got no one to even pick up the phone. After this I had my for an office coordinator call and she was able to finally get through and leave a message  with the D ON as of dictation of this note which is roughly about an hour and a half later I still have not been able to speak with anyone at the facility. 09/25/2020 upon evaluation today patient actually showing signs of good improvement which is excellent news. He has been tolerating the dressing changes without complication. Fortunately there is no signs of active infection which is great news. No fevers, chills, nausea, vomiting, or diarrhea. I do feel like the facility has been doing a much better job at taking care of him as far as the dressings are concerned. However the director of nursing never did call me back. 10/09/2020 upon evaluation today patient appears to be doing well with regard to his wound. The toe ulcer did require some debridement but the other 2 areas actually appear to be doing quite well. 10/19/2020 upon evaluation today patient actually appears to be doing very well in regard to his wounds. In fact the heel does appear to be completely healed. The toe is doing better in the medial ankle on the left is also doing better. Overall I think he is headed in the right direction. 10/26/2020 upon evaluation today patient appears to be doing well with regard to his wound. He is showing signs of improvement which is great news and overall I am very pleased with where things stand today. No fevers, chills, nausea, vomiting, or diarrhea. 11/02/2020 upon evaluation today patient appears to be doing well with regard to his wounds. He has been tolerating the dressing changes without complication overall I am extremely pleased with where things stand today. He in regard to the toe is almost completely healed and the medial [facility], Chrishaun ([id number]) ankle on the left is doing much better. 11/09/2020 upon evaluation today patient appears to be doing a little poorly in regard to his left medial ankle ulcer. Fortunately there does not appear to be any signs of systemic infection but unfortunately  locally he does appear to be infected in fact he has blue-green drainage consistent with Pseudomonas. 11/16/2020 upon evaluation today patient appears to be doing well with regard to his wound. It actually appears to be doing better. I did place him on gentamicin cream since the Cipro was actually resistant even though he was positive for Pseudomonas on culture. Overall I think that he does seem to be doing better though I am unsure whether or not they have actually been putting the cream on. The patient is not sure that we did talk to the nurse directly and she was going to initiate that treatment. Fortunately there does not appear to be any signs of active infection at this time. No fevers, chills, nausea, vomiting, or diarrhea. 4/28; the area on the right second toe is close to healed. Left medial ankle required debridement 12/07/2020 upon evaluation today patient appears to be doing well with regard to his wounds. In fact the right second toe appears to be completely healed which is great news. Fortunately there does not appear to be any signs  of active infection at this time which is also great news. I think we can probably discontinue the gentamicin on top of everything else. 12/14/2020 upon evaluation today patient appears to be doing well with regard to his wound. He is making good progress and overall very pleased with where things stand today. There is no signs of active infection at this time which is great news. 12/28/2020 upon evaluation today patient appears to be doing well with regard to his wounds. He has been tolerating the dressing changes without complication. Fortunately there is no signs of active infection at this time. No fevers, chills, nausea, vomiting, or diarrhea. 12/28/2020 upon evaluation today patient's wound bed actually showed signs of excellent improvement. He has great epithelization and granulation I do not see any signs of infection overall I am extremely pleased with  where things stand at this point. No fevers, chills, nausea, vomiting, or diarrhea. 01/11/2021 upon evaluation today patient appears to be doing well with regard to his wound on his leg. He has been tolerating the dressing changes without complication. Fortunately there does not appear to be any signs of active infection which is great news. No fevers, chills, nausea, vomiting, or diarrhea. 01/25/2021 upon evaluation today patient appears to be doing well with regard to his wound. He has been tolerating the dressing changes without complication. Fortunately the collagen seems to be doing a great job which is excellent news. No fevers, chills, nausea, vomiting, or diarrhea. 02/08/2021 upon evaluation today patient's wound is actually looking a little bit worse especially in the periwound compared to previous. Fortunately there does not appear to be any signs of infection which is great news with that being said he does have some irritation around the periphery of the wound which has me more concerned. He actually had a dressing on that had not been changed in 3 days. He also is supposed to have daily dressing changes. With regard to the dressing applied he had a silver alginate dressing and silver collagen is what is recommended and ordered. He also had no Desitin around the edges of the wound in the periwound region although that is on the order inspect to be done as well. In general I was very concerned I did contact [facility] actually spoke with Chad [name] who is the scheduling individual and subsequently she stated that she would pass the information to the D ON apparently the D ON was not available to talk to me when I call today. Objective Constitutional Well-nourished and well-hydrated in no acute distress. Vitals Time Taken: 9:19 AM, Height: 67 in, Weight: 160 lbs, BMI: 25.1, Temperature: 98.2 F, Pulse: 80 bpm, Respiratory Rate: 18 breaths/min, Blood Pressure: 112/74  mmHg. Respiratory normal breathing without difficulty. Psychiatric this patient is able to make decisions and demonstrates good insight into disease process. Alert and Oriented x 3. pleasant and cooperative. General Notes: Upon inspection patient's wound bed actually showed signs of fairly good granulation with some biofilm and slough noted on the surface of the wound I did perform sharp debridement to clear this away today. He tolerated that without complication minimal bleeding noted hemostasis achieved by pressure. Integumentary (Hair, Skin) Wound #8 status is Open. Original cause of wound was Gradually Appeared. The date acquired was: 07/12/2019. The wound has been in treatment 25 weeks. The wound is located on the Left,Medial Ankle. The wound measures 2.6cm length x 1.6cm width x 0.1cm depth; 3.267cm^2 area and 0.327cm^3 volume. There is Fat Layer (Subcutaneous Tissue) exposed. There is  no tunneling or undermining noted. There is a medium amount of serosanguineous drainage noted. The wound margin is flat and intact. There is medium (34-66%) red, pink granulation within the wound bed. There is a medium (34-66%) amount of necrotic tissue within the wound bed including Adherent Slough. 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 There was a Excisional Skin/Subcutaneous Tissue Debridement with a total area of 4.16 sq cm performed by Tommie Sams., PA-C.  With the following instrument(s): Curette to remove Viable and Non-Viable tissue/material. Material removed includes Subcutaneous Tissue, Slough, and Biofilm. A time out was conducted at 09:30, prior to the start of the procedure. A Minimum amount of bleeding was controlled with Pressure. The procedure was tolerated well. Post Debridement Measurements: 2.6cm length x 1.6cm width x 0.2cm depth; 0.653cm^3 volume. Character of Wound/Ulcer Post Debridement is stable. Severity of Tissue Post Debridement is: Fat layer exposed. Post procedure Diagnosis Wound #8: Same as Pre-Procedure Plan Follow-up Appointments: Return Appointment in 1 week. Bathing/ Shower/ Hygiene: May shower; gently cleanse wound with antibacterial soap, rinse and pat dry prior to dressing wounds WOUND #8: - Ankle Wound Laterality: Left, Medial Cleanser: Normal Saline 1 x Per Day/30 Days Discharge Instructions: Wash your hands with soap and water. Remove old dressing, discard into plastic bag and place into trash. Cleanse the wound with Normal Saline prior to applying a clean dressing using gauze sponges, not tissues or cotton balls. Do not scrub or use excessive force. Pat dry using gauze sponges, not tissue or cotton balls. Peri-Wound Care: Desitin Maximum Strength Ointment 4 (oz) 1 x Per Day/30 Days Discharge Instructions: Zinc periwound Primary Dressing: Prisma 4.34 (in) 1 x Per Day/30 Days Discharge Instructions: Moisten w/normal saline or sterile water; Cover wound as directed. Do not remove from wound bed. Secondary Dressing: ABD Pad 5x9 (in/in) 1 x Per Day/30 Days Discharge Instructions: Cover with ABD pad Secondary Dressing: Kerlix 4.5 x 4.1 (in/yd) 1 x Per Day/30 Days Discharge Instructions: Apply Kerlix 4.5 x 4.1 (in/yd) as instructed Secured With: 76M Medipore H Soft Cloth Surgical Tape, 2x2 (in/yd) 1 x Per Day/30 Days Discharge Instructions: Secure kerlix 1. Recommend currently that we going continue with the wound  care measures as before and the patient is in agreement with the plan. This includes the use of the silver collagen dressing again with changing this daily. 2. I am also can recommend at this time that we have the patient continue with the zinc around the periwound that should be still the thing that we utilize at this point. It has not been being done and I did contact the facility about that today. 3. Also think the patient's wound that needs to have the collagen in place again had alginate on today and did not even have the appropriate dressing. Obviously this is very frustrating that order is not being followed appropriately. We will see patient back for reevaluation in 1 week here in the clinic. If anything worsens or changes patient will contact our office for additional recommendations. Electronic Signature(s) Signed: 02/08/2021 10:23:55 AM By:  Melburn Hake, Anyla Israelson PA-C Entered By: Worthy Keeler on 02/08/2021 10:23:55 Chad [name] ([id number]) Chad [name] ([id number]) -------------------------------------------------------------------------------- SuperBill Details Patient Name: Chad [name] Date of Service: 02/08/2021 Medical Record Number: [id number] Patient Account Number: [id number] Date of Birth/Sex: [birth year] (60 y.o. M) Treating RN: Dolan Amen Primary Care Provider: Clayborn Bigness Other Clinician: Referring Provider: Clayborn Bigness Treating Provider/Extender: Skipper Cliche in Treatment: 25 Diagnosis Coding ICD-10 Codes Code Description I87.2 Venous insufficiency (chronic) (peripheral) L97.322 Non-pressure chronic ulcer of left ankle with fat layer exposed L97.412 Non-pressure chronic ulcer of right heel and midfoot with fat layer exposed L97.512 Non-pressure chronic ulcer of other part of right foot with fat layer exposed 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 side Facility Procedures CPT4 Code:  38329191 Description: 66060 - DEB SUBQ TISSUE 20 SQ CM/< Modifier: Quantity: 1 CPT4 Code: Description: ICD-10 Diagnosis Description L97.322 Non-pressure chronic ulcer of left ankle with fat layer exposed Modifier: Quantity: Physician Procedures CPT4 Code: 0459977 Description: 41423 - WC PHYS LEVEL 4 - EST PT Modifier: 25 Quantity: 1 CPT4 Code: Description: ICD-10 Diagnosis Description I87.2 Venous insufficiency (chronic) (peripheral) L97.322 Non-pressure chronic ulcer of left ankle with fat layer exposed L97.412 Non-pressure chronic ulcer of right heel and midfoot with fat layer expos L97.512  Non-pressure chronic ulcer of other part of right foot with fat layer exp Modifier: ed osed Quantity: CPT4 Code: 9532023 Description: 11042 - WC PHYS SUBQ TISS 20 SQ CM Modifier: Quantity: 1 CPT4 Code: Description: ICD-10 Diagnosis Description L97.322 Non-pressure chronic ulcer of left ankle with fat layer exposed Modifier: Quantity: Electronic Signature(s) Signed: 02/08/2021 10:24:15 AM By: Worthy Keeler PA-C Previous Signature: 02/08/2021 9:55:24 AM Version By: Dolan Amen RN Entered By: Worthy Keeler on 02/08/2021 10:24:15

## 2021-02-08 NOTE — Progress Notes (Addendum)
SIYON, LINCK ([id number]) Visit Report for 02/08/2021 Arrival Information Details Patient Name: Chad [name], Chad [name] Date of Service: 02/08/2021 9:15 AM Medical Record Number: [id number] Patient Account Number: [id number] Date of Birth/Sex: 1961-02-16 (60 y.o. M) Treating RN: Dolan Amen Primary Care Oddie Kuhlmann: Clayborn Bigness Other Clinician: Referring Coutney Wildermuth: Clayborn Bigness Treating Liandro Thelin/Extender: Skipper Cliche in Treatment: 25 Visit Information History Since Last Visit Added or deleted any medications: No Patient Arrived: Wheel Chair Had a fall or experienced change in No Arrival Time: 09:18 activities of daily living that may affect Accompanied By: self risk of falls: Transfer Assistance: None Pain Present Now: No Patient Identification Verified: Yes Secondary Verification Process Completed: Yes Patient Requires Transmission-Based No Precautions: Patient Has Alerts: Yes Patient Alerts: Patient on Blood Thinner ABI right 1.16 ABI left 1.14 Electronic Signature(s) Signed: 02/08/2021 4:02:25 PM By: Dolan Amen RN Entered By: Dolan Amen on 02/08/2021 09:19:10 Chad [name] ([id number]) -------------------------------------------------------------------------------- Clinic Level of Care Assessment Details Patient Name: Chad [name] Date of Service: 02/08/2021 9:15 AM Medical Record Number: [id number] Patient Account Number: [id number] Date of Birth/Sex: 11/27/1960 (60 y.o. M) Treating RN: Dolan Amen Primary Care Hayla Hinger: Clayborn Bigness Other Clinician: Referring Artemisia Auvil: Clayborn Bigness Treating [name] [name]/Extender: Skipper Cliche in Treatment: 25 Clinic Level of Care Assessment Items TOOL 1 Quantity Score '[]'  - Use when EandM and Procedure is performed on INITIAL visit 0 ASSESSMENTS - Nursing Assessment / Reassessment '[]'  - General Physical Exam (combine w/ comprehensive assessment (listed just below) when performed on new 0 pt. evals) '[]'  - 0 Comprehensive  Assessment (HX, ROS, Risk Assessments, Wounds Hx, etc.) ASSESSMENTS - Wound and Skin Assessment / Reassessment '[]'  - Dermatologic / Skin Assessment (not related to wound area) 0 ASSESSMENTS - Ostomy and/or Continence Assessment and Care '[]'  - Incontinence Assessment and Management 0 '[]'  - 0 Ostomy Care Assessment and Management (repouching, etc.) PROCESS - Coordination of Care '[]'  - Simple Patient / Family Education for ongoing care 0 '[]'  - 0 Complex (extensive) Patient / Family Education for ongoing care '[]'  - 0 Staff obtains Programmer, systems, Records, Test Results / Process Orders '[]'  - 0 Staff telephones HHA, Nursing Homes / Clarify orders / etc '[]'  - 0 Routine Transfer to another Facility (non-emergent condition) '[]'  - 0 Routine Hospital Admission (non-emergent condition) '[]'  - 0 New Admissions / Biomedical engineer / Ordering NPWT, Apligraf, etc. '[]'  - 0 Emergency Hospital Admission (emergent condition) PROCESS - Special Needs '[]'  - Pediatric / Minor Patient Management 0 '[]'  - 0 Isolation Patient Management '[]'  - 0 Hearing / Language / Visual special needs '[]'  - 0 Assessment of Community assistance (transportation, D/C planning, etc.) '[]'  - 0 Additional assistance / Altered mentation '[]'  - 0 Support Surface(s) Assessment (bed, cushion, seat, etc.) INTERVENTIONS - Miscellaneous '[]'  - External ear exam 0 '[]'  - 0 Patient Transfer (multiple staff / Civil Service fast streamer / Similar devices) '[]'  - 0 Simple Staple / Suture removal (25 or less) '[]'  - 0 Complex Staple / Suture removal (26 or more) '[]'  - 0 Hypo/Hyperglycemic Management (do not check if billed separately) '[]'  - 0 Ankle / Brachial Index (ABI) - do not check if billed separately Has the patient been seen at the hospital within the last three years: Yes Total Score: 0 Level Of Care: ____ Chad [name] ([id number]) Electronic Signature(s) Signed: 02/08/2021 4:02:25 PM By: Dolan Amen RN Entered By: Dolan Amen on 02/08/2021  09:36:13 Chad [name] ([id number]) -------------------------------------------------------------------------------- Encounter Discharge Information Details Patient Name: Chad [name] Date of Service: 02/08/2021 9:15 AM Medical  Record Number: [id number] Patient Account Number: [id number] Date of Birth/Sex: 25-Sep-1960 (60 y.o. M) Treating RN: Dolan Amen Primary Care Cassara Nida: Clayborn Bigness Other Clinician: Referring Lorenso Quirino: Clayborn Bigness Treating Seerat Peaden/Extender: Skipper Cliche in Treatment: 25 Encounter Discharge Information Items Post Procedure Vitals Discharge Condition: Stable Temperature (F): 98.2 Ambulatory Status: Wheelchair Pulse (bpm): 80 Discharge Destination: [place] Respiratory Rate (breaths/min): 18 Orders Sent: Yes Blood Pressure (mmHg): 112/74 Transportation: Private Auto Accompanied By: self Schedule Follow-up Appointment: Yes Clinical Summary of Care: Electronic Signature(s) Signed: 02/08/2021 9:57:56 AM By: Dolan Amen RN Entered By: Dolan Amen on 02/08/2021 09:57:56 Chad [name] ([id number]) -------------------------------------------------------------------------------- Lower Extremity Assessment Details Patient Name: Chad [name] Date of Service: 02/08/2021 9:15 AM Medical Record Number: [id number] Patient Account Number: [id number] Date of Birth/Sex: 03-08-1961 (60 y.o. M) Treating RN: Dolan Amen Primary Care Almendra Loria: Clayborn Bigness Other Clinician: Referring Chardonay Scritchfield: Clayborn Bigness Treating Oluwatimileyin Vivier/Extender: Jeri Cos Weeks in Treatment: 25 Edema Assessment Assessed: [Left: Yes] [Right: No] Edema: [Left: N] [Right: o] Vascular Assessment Pulses: Dorsalis Pedis Palpable: [Left:No Yes] Electronic Signature(s) Signed: 02/08/2021 4:02:25 PM By: Dolan Amen RN Entered By: Dolan Amen on 02/08/2021 09:28:05 Chad [name]  ([id number]) -------------------------------------------------------------------------------- Multi Wound Chart Details Patient Name: Chad [name] Date of Service: 02/08/2021 9:15 AM Medical Record Number: [id number] Patient Account Number: [id number] Date of Birth/Sex: 07-31-1960 (60 y.o. M) Treating RN: Dolan Amen Primary Care Artis Beggs: Clayborn Bigness Other Clinician: Referring Breelle Hollywood: Clayborn Bigness Treating Adriann Thau/Extender: Skipper Cliche in Treatment: 25 Vital Signs Height(in): 67 Pulse(bpm): 80 Weight(lbs): 160 Blood Pressure(mmHg): 112/74 Body Mass Index(BMI): 25 Temperature(F): 98.2 Respiratory Rate(breaths/min): 18 Photos: [N/A:N/A] Wound Location: Left, Medial Ankle N/A N/A Wounding Event: Gradually Appeared N/A N/A Primary Etiology: Venous Leg Ulcer N/A N/A Comorbid History: Anemia, Chronic Obstructive N/A N/A Pulmonary Disease (COPD), Hepatitis C, Received Chemotherapy Date Acquired: 07/12/2019 N/A N/A Weeks of Treatment: 25 N/A N/A Wound Status: Open N/A N/A Measurements L x W x D (cm) 2.6x1.6x0.1 N/A N/A Area (cm) : 3.267 N/A N/A Volume (cm) : 0.327 N/A N/A % Reduction in Area: 44.50% N/A N/A % Reduction in Volume: 44.50% N/A N/A Classification: Full Thickness Without Exposed N/A N/A Support Structures Exudate Amount: Medium N/A N/A Exudate Type: Serosanguineous N/A N/A Exudate Color: red, brown N/A N/A Wound Margin: Flat and Intact N/A N/A Granulation Amount: Medium (34-66%) N/A N/A Granulation Quality: Red, Pink N/A N/A Necrotic Amount: Medium (34-66%) N/A N/A Exposed Structures: Fat Layer (Subcutaneous Tissue): N/A N/A Yes Fascia: No Tendon: No Muscle: No Joint: No Bone: No Epithelialization: Medium (34-66%) N/A N/A Treatment Notes Electronic Signature(s) Signed: 02/08/2021 4:02:25 PM By: Dolan Amen RN Entered By: Dolan Amen on 02/08/2021 09:28:20 Chad [name]  ([id number]) -------------------------------------------------------------------------------- Multi-Disciplinary Care Plan Details Patient Name: Chad [name] Date of Service: 02/08/2021 9:15 AM Medical Record Number: [id number] Patient Account Number: [id number] Date of Birth/Sex: 07-Jun-1961 (60 y.o. M) Treating RN: Dolan Amen Primary Care Aimee Timmons: Clayborn Bigness Other Clinician: Referring Ronnett Pullin: Clayborn Bigness Treating Dalphine Cowie/Extender: Skipper Cliche in Treatment: 25 Active Inactive Wound/Skin Impairment Nursing Diagnoses: Knowledge deficit related to ulceration/compromised skin integrity Goals: Patient/caregiver will verbalize understanding of skin care regimen Date Initiated: 08/14/2020 Date Inactivated: 10/09/2020 Target Resolution Date: 09/14/2020 Goal Status: Met Ulcer/skin breakdown will have a volume reduction of 30% by week 4 Date Initiated: 08/14/2020 Date Inactivated: 10/19/2020 Target Resolution Date: 09/14/2020 Goal Status: Met Ulcer/skin breakdown will heal within 14 weeks Date Initiated: 12/07/2020 Target Resolution Date: 01/23/2021 Goal Status: Active Interventions: Assess patient/caregiver ability to obtain  necessary supplies Assess patient/caregiver ability to perform ulcer/skin care regimen upon admission and as needed Assess ulceration(s) every visit Notes: Electronic Signature(s) Signed: 02/08/2021 4:02:25 PM By: Dolan Amen RN Entered By: Dolan Amen on 02/08/2021 09:28:13 Chad [name] ([id number]) -------------------------------------------------------------------------------- Pain Assessment Details Patient Name: Chad [name] Date of Service: 02/08/2021 9:15 AM Medical Record Number: [id number] Patient Account Number: [id number] Date of Birth/Sex: [birth year] (60 y.o. M) Treating RN: Dolan Amen Primary Care Ronin Crager: Clayborn Bigness Other Clinician: Referring Willaim Mode: Clayborn Bigness Treating Ariela Mochizuki/Extender: Skipper Cliche in  Treatment: 25 Active Problems Location of Pain Severity and Description of Pain Patient Has Paino No Site Locations Rate the pain. Current Pain Level: 0 Pain Management and Medication Current Pain Management: Electronic Signature(s) Signed: 02/08/2021 4:02:25 PM By: Dolan Amen RN Entered By: Dolan Amen on 02/08/2021 09:19:33 Chad [name] ([id number]) -------------------------------------------------------------------------------- Patient/Caregiver Education Details Patient Name: Chad [name] Date of Service: 02/08/2021 9:15 AM Medical Record Number: [id number] Patient Account Number: [id number] Date of Birth/Gender: 1961/02/08 (60 y.o. M) Treating RN: Dolan Amen Primary Care Physician: Clayborn Bigness Other Clinician: Referring Physician: Clayborn Bigness Treating Physician/Extender: Skipper Cliche in Treatment: 25 Education Assessment Education Provided To: Patient Education Topics Provided Wound/Skin Impairment: Methods: Explain/Verbal Responses: State content correctly Electronic Signature(s) Signed: 02/08/2021 4:02:25 PM By: Dolan Amen RN Entered By: Dolan Amen on 02/08/2021 09:55:32 Chad [name] ([id number]) -------------------------------------------------------------------------------- Wound Assessment Details Patient Name: Chad [name] Date of Service: 02/08/2021 9:15 AM Medical Record Number: [id number] Patient Account Number: [id number] Date of Birth/Sex: November 24, 1960 (60 y.o. M) Treating RN: Dolan Amen Primary Care Zilah Villaflor: Clayborn Bigness Other Clinician: Referring Bayley Yarborough: Clayborn Bigness Treating Bijan Ridgley/Extender: Jeri Cos Weeks in Treatment: 25 Wound Status Wound Number: 8 Primary Venous Leg Ulcer Etiology: Wound Location: Left, Medial Ankle Wound Open Wounding Event: Gradually Appeared Status: Date Acquired: 07/12/2019 Comorbid Anemia, Chronic Obstructive Pulmonary Disease (COPD), Weeks Of Treatment: 25 History: Hepatitis  C, Received Chemotherapy Clustered Wound: No Photos Wound Measurements Length: (cm) 2.6 Width: (cm) 1.6 Depth: (cm) 0.1 Area: (cm) 3.267 Volume: (cm) 0.327 % Reduction in Area: 44.5% % Reduction in Volume: 44.5% Epithelialization: Medium (34-66%) Tunneling: No Undermining: No Wound Description Classification: Full Thickness Without Exposed Support Structures Wound Margin: Flat and Intact Exudate Amount: Medium Exudate Type: Serosanguineous Exudate Color: red, brown Foul Odor After Cleansing: No Slough/Fibrino Yes Wound Bed Granulation Amount: Medium (34-66%) Exposed Structure Granulation Quality: Red, Pink Fascia Exposed: No Necrotic Amount: Medium (34-66%) Fat Layer (Subcutaneous Tissue) Exposed: Yes Necrotic Quality: Adherent Slough Tendon Exposed: No Muscle Exposed: No Joint Exposed: No Bone Exposed: No Treatment Notes Wound #8 (Ankle) Wound Laterality: Left, Medial Cleanser Normal Saline Discharge Instruction: Wash your hands with soap and water. Remove old dressing, discard into plastic bag and place into trash. Cleanse the wound with Normal Saline prior to applying a clean dressing using gauze sponges, not tissues or cotton balls. Do not scrub or use excessive force. Pat dry using gauze sponges, not tissue or cotton balls. Chad [name], Chad [name] ([id number]) Peri-Wound Care Desitin Maximum Strength Ointment 4 (oz) Discharge Instruction: Zinc periwound Topical Primary Dressing Prisma 4.34 (in) Discharge Instruction: Moisten w/normal saline or sterile water; Cover wound as directed. Do not remove from wound bed. Secondary Dressing ABD Pad 5x9 (in/in) Discharge Instruction: Cover with ABD pad Kerlix 4.5 x 4.1 (in/yd) Discharge Instruction: Apply Kerlix 4.5 x 4.1 (in/yd) as instructed Secured With 71M Vandiver Surgical Tape, 2x2 (in/yd) Discharge Instruction: Secure kerlix Compression Wrap Compression Stockings Add-Ons Electronic  Signature(s)  Signed: 02/08/2021 4:02:25 PM By: Dolan Amen RN Entered By: Dolan Amen on 02/08/2021 09:22:56 Chad [name] ([id number]) -------------------------------------------------------------------------------- [place] Details Patient Name: Chad [name] Date of Service: 02/08/2021 9:15 AM Medical Record Number: [id number] Patient Account Number: [id number] Date of Birth/Sex: 01/11/1961 (60 y.o. M) Treating RN: Dolan Amen Primary Care Carron Mcmurry: Clayborn Bigness Other Clinician: Referring Samaiya Awadallah: Clayborn Bigness Treating Anelisse Jacobson/Extender: Skipper Cliche in Treatment: 25 Vital Signs Time Taken: 09:19 Temperature (F): 98.2 Height (in): 67 Pulse (bpm): 80 Weight (lbs): 160 Respiratory Rate (breaths/min): 18 Body Mass Index (BMI): 25.1 Blood Pressure (mmHg): 112/74 Reference Range: 80 - 120 mg / dl Electronic Signature(s) Signed: 02/08/2021 4:02:25 PM By: Dolan Amen RN Entered By: Dolan Amen on 02/08/2021 09:19:28

## 2021-02-18 ENCOUNTER — Other Ambulatory Visit: Payer: Self-pay

## 2021-02-18 ENCOUNTER — Encounter: Payer: Medicaid Other | Admitting: Physician Assistant

## 2021-02-18 DIAGNOSIS — L97322 Non-pressure chronic ulcer of left ankle with fat layer exposed: Secondary | ICD-10-CM | POA: Diagnosis not present

## 2021-02-18 NOTE — Progress Notes (Addendum)
DEVONNE, SNEIDER ([id number]) Visit Report for 02/18/2021 Chief Complaint Document Details Patient Name: Chad [name], Chad [name] Date of Service: 02/18/2021 11:15 AM Medical Record Number: [id number] Patient Account Number: [id number] Date of Birth/Sex: Feb 09, 1961 (60 y.o. M) Treating RN: Dolan Amen Primary Care Provider: Clayborn Bigness Other Clinician: Referring Provider: Clayborn Bigness Treating Provider/Extender: Skipper Cliche in Treatment: 26 Information Obtained from: Patient Chief Complaint Left ankle and right foot and heel ulcers Electronic Signature(s) Signed: 02/18/2021 11:26:57 AM By: Worthy Keeler PA-C Entered By: Worthy Keeler on 02/18/2021 11:26:57 Chad [name] ([id number]) -------------------------------------------------------------------------------- Debridement Details Patient Name: Chad [name] Date of Service: 02/18/2021 11:15 AM Medical Record Number: [id number] Patient Account Number: [id number] Date of Birth/Sex: Jun 03, 1961 (60 y.o. M) Treating RN: Dolan Amen Primary Care Provider: Clayborn Bigness Other Clinician: Referring Provider: Clayborn Bigness Treating Provider/Extender: Skipper Cliche in Treatment: 26 Debridement Performed for Wound #8 Left,Medial Ankle Assessment: Performed By: Physician Tommie Sams., PA-C Debridement Type: Debridement Severity of Tissue Pre Debridement: Fat layer exposed Level of Consciousness (Pre- Awake and Alert procedure): Pre-procedure Verification/Time Out Yes - 11:50 Taken: Start Time: 11:50 Total Area Debrided (L x W): 2.5 (cm) x 1.4 (cm) = 3.5 (cm) Tissue and other material Viable, Non-Viable, Slough, Subcutaneous, Biofilm, Slough debrided: Level: Skin/Subcutaneous Tissue Debridement Description: Excisional Instrument: Curette Bleeding: Minimum Hemostasis Achieved: Pressure Response to Treatment: Procedure was tolerated well Level of Consciousness (Post- Awake and Alert procedure): Post Debridement  Measurements of Total Wound Length: (cm) 2.5 Width: (cm) 1.4 Depth: (cm) 0.2 Volume: (cm) 0.55 Character of Wound/Ulcer Post Debridement: Stable Severity of Tissue Post Debridement: Fat layer exposed Post Procedure Diagnosis Same as Pre-procedure Electronic Signature(s) Signed: 02/18/2021 4:44:39 PM By: Worthy Keeler PA-C Signed: 02/18/2021 4:47:50 PM By: Dolan Amen RN Entered By: Dolan Amen on 02/18/2021 11:52:45 Chad [name] ([id number]) -------------------------------------------------------------------------------- HPI Details Patient Name: Chad [name] Date of Service: 02/18/2021 11:15 AM Medical Record Number: [id number] Patient Account Number: [id number] Date of Birth/Sex: 03/02/1961 (60 y.o. M) Treating RN: Dolan Amen Primary Care Provider: Clayborn Bigness Other Clinician: Referring Provider: Clayborn Bigness Treating Provider/Extender: Skipper Cliche in Treatment: 26 History of Present Illness HPI Description: 10/08/18 on evaluation today patient actually presents to our office for initial evaluation concerning wounds that he has of the bilateral lower extremities. 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 He also has bilateral venous stasis. He apparently has been homeless currently following discharge from the hospital apparently he has been placed at [facility] which is is a skilled nursing facility locally. Nonetheless fortunately he does not show any signs of infection at this time which is good news. In fact several of the wound actually appears to be showing some signs of improvement already in my pinion. There are a couple areas in the left leg in particular there likely gonna require some sharp debridement to help clear away some necrotic tissue and help with more sufficient healing. No  fevers, chills, nausea, or vomiting noted at this time. 10/15/18 on evaluation today patient actually appears to be doing very well in regard to his bilateral lower extremities. He's been tolerating the dressing changes without complication. Fortunately there does not appear to be any evidence of active infection at this time which is great news. Overall I'm actually very pleased with how this has progressed in just one visits time. Readmission: 08/14/2020 upon  evaluation today patient presents for re-evaluation here in our clinic. He is having issues with his left ankle region as well as his right toe and his right heel. He tells me that the toe and heel actually began as a area that was itching that he was scratching and then subsequently opened up into wounds. These may have been abscess areas I presume based on what I am seeing currently. With regard to his left ankle region he tells me this was a similar type occurrence although he does have venous stasis this very well may be more of a venous leg ulcer more than anything. Nonetheless I do believe that the patient would benefit from appropriate and aggressive wound care to try to help get things under better control here. He does have history of a stroke on the left side affecting him to some degree there that he is able to stand although he does have some residual weakness. Otherwise again the patient does have chronic venous insufficiency as previously noted. His arterial studies most recently obtained showed that he had an ABI on the right of 1.16 with a TBI of 0.52 and on the left and ABI of 1.14 with a TBI of 0.81. That was obtained on 06/19/2020. 08/28/2020 upon evaluation today patient appears to be doing decently well in regard to his wounds in general. He has been tolerating the dressing changes without complication. Fortunately there does not appear to be any signs of active infection which is great news. With that being said I think the  [facility] is doing a good job I would recommend that we likely continue with that currently. 09/11/2020 upon evaluation today patient's wounds did not appear to be doing too poorly but again he is not really showing signs of significant improvement with regard to any of the wounds on the right. None of them have Hydrofera Blue on them I am not exactly sure why this is not being followed as the facility did not contact us to let us know of any issues with obtaining dressings or otherwise. With that being said he is supposed to be using Hydrofera Blue on both of the wounds on the right foot as well as the ankle wound on the left side. 09/18/2020 upon evaluation today patient appears to be doing poorly with regard to his wounds. Again right now the left ankle in particular showed signs of extreme maceration. Apparently he was told by someone with staff at [place] they could not get the [facility]. With that being said this is something that is never been relayed to [place] one way or another. Also the patient subsequently has not supposed to have a border gauze dressing on. He should have an ABD pad and roll gauze to secure as this drains much too much just to have a border gauze dressing to cover. Nonetheless the fact that they are not using the appropriate dressing is directly causing deterioration of the left ankle wound it is significantly worse today compared to what it was previous. I did attempt to call [facility] while the patient was here I called three times and got no one to even pick up the phone. After this I had my for an office coordinator call and she was able to finally get through and leave a message with the D ON as of dictation of this note which is roughly about an hour and a half later I still have not been able to speak with anyone at  the facility. 09/25/2020 upon evaluation today patient actually showing signs of good improvement which is excellent news. He has  been tolerating the dressing changes without complication. Fortunately there is no signs of active infection which is great news. No fevers, chills, nausea, vomiting, or diarrhea. I do feel like the facility has been doing a much better job at taking care of him as far as the dressings are concerned. However the director of nursing never did call me back. 10/09/2020 upon evaluation today patient appears to be doing well with regard to his wound. The toe ulcer did require some debridement but the other 2 areas actually appear to be doing quite well. 10/19/2020 upon evaluation today patient actually appears to be doing very well in regard to his wounds. In fact the heel does appear to be completely healed. The toe is doing better in the medial ankle on the left is also doing better. Overall I think he is headed in the right direction. 10/26/2020 upon evaluation today patient appears to be doing well with regard to his wound. He is showing signs of improvement which is great news and overall I am very pleased with where things stand today. No fevers, chills, nausea, vomiting, or diarrhea. 11/02/2020 upon evaluation today patient appears to be doing well with regard to his wounds. He has been tolerating the dressing changes without complication overall I am extremely pleased with where things stand today. He in regard to the toe is almost completely healed and the medial ankle on the left is doing much better. 11/09/2020 upon evaluation today patient appears to be doing a little poorly in regard to his left medial ankle ulcer. Fortunately there does not appear to be any signs of systemic infection but unfortunately locally he does appear to be infected in fact he has blue-green drainage consistent with Pseudomonas. Chad [name], Chad [name] ([id number]) 11/16/2020 upon evaluation today patient appears to be doing well with regard to his wound. It actually appears to be doing better. I did place him on gentamicin cream  since the Cipro was actually resistant even though he was positive for Pseudomonas on culture. Overall I think that he does seem to be doing better though I am unsure whether or not they have actually been putting the cream on. The patient is not sure that we did talk to the nurse directly and she was going to initiate that treatment. Fortunately there does not appear to be any signs of active infection at this time. No fevers, chills, nausea, vomiting, or diarrhea. 4/28; the area on the right second toe is close to healed. Left medial ankle required debridement 12/07/2020 upon evaluation today patient appears to be doing well with regard to his wounds. In fact the right second toe appears to be completely healed which is great news. Fortunately there does not appear to be any signs of active infection at this time which is also great news. I think we can probably discontinue the gentamicin on top of everything else. 12/14/2020 upon evaluation today patient appears to be doing well with regard to his wound. He is making good progress and overall very pleased with where things stand today. There is no signs of active infection at this time which is great news. 12/28/2020 upon evaluation today patient appears to be doing well with regard to his wounds. He has been tolerating the dressing changes without complication. Fortunately there is no signs of active infection at this time. No fevers, chills, nausea, vomiting, or diarrhea. 12/28/2020 upon  evaluation today patient's wound bed actually showed signs of excellent improvement. He has great epithelization and granulation I do not see any signs of infection overall I am extremely pleased with where things stand at this point. No fevers, chills, nausea, vomiting, or diarrhea. 01/11/2021 upon evaluation today patient appears to be doing well with regard to his wound on his leg. He has been tolerating the dressing changes without complication. Fortunately there  does not appear to be any signs of active infection which is great news. No fevers, chills, nausea, vomiting, or diarrhea. 01/25/2021 upon evaluation today patient appears to be doing well with regard to his wound. He has been tolerating the dressing changes without complication. Fortunately the collagen seems to be doing a great job which is excellent news. No fevers, chills, nausea, vomiting, or diarrhea. 02/08/2021 upon evaluation today patient's wound is actually looking a little bit worse especially in the periwound compared to previous. Fortunately there does not appear to be any signs of infection which is great news with that being said he does have some irritation around the periphery of the wound which has me more concerned. He actually had a dressing on that had not been changed in 3 days. He also is supposed to have daily dressing changes. With regard to the dressing applied he had a silver alginate dressing and silver collagen is what is recommended and ordered. He also had no Desitin around the edges of the wound in the periwound region although that is on the order inspect to be done as well. In general I was very concerned I did contact [facility] actually spoke with Magda Paganini who is the scheduling individual and subsequently she stated that she would pass the information to the D ON apparently the D ON was not available to talk to me when I call today. 02/18/2021 upon evaluation today patient's wound is actually showing signs of improvement. Fortunately there does not appear to be any evidence of infection which is great news overall I am extremely pleased with where things stand today. No fevers, chills, nausea, vomiting, or diarrhea. Electronic Signature(s) Signed: 02/18/2021 12:51:41 PM By: Worthy Keeler PA-C Entered By: Worthy Keeler on 02/18/2021 12:51:41 Chad [name] ([id number]) -------------------------------------------------------------------------------- Physical  Exam Details Patient Name: Chad [name] Date of Service: 02/18/2021 11:15 AM Medical Record Number: [id number] Patient Account Number: [id number] Date of Birth/Sex: 10/10/1960 (60 y.o. M) Treating RN: Dolan Amen Primary Care Provider: Clayborn Bigness Other Clinician: Referring Provider: Clayborn Bigness Treating Provider/Extender: Skipper Cliche in Treatment: 68 Constitutional Well-nourished and well-hydrated in no acute distress. Respiratory normal breathing without difficulty. Psychiatric this patient is able to make decisions and demonstrates good insight into disease process. Alert and Oriented x 3. pleasant and cooperative. Notes Patient's wound did have silver alginate not silver collagen on the wound today upon inspection. With that being said no one from the facility is contacted me after I contacted the DO in last week. I had to leave a message with the transportation individual but nonetheless unfortunately I have not heard anything back yet they still continue to use alginate on the area again the best treatment option here is collagen which I think will help it heal faster I discussed that with the patient I put in the notes and I think that still what we need to stick with at this point. Electronic Signature(s) Signed: 02/18/2021 12:52:18 PM By: Worthy Keeler PA-C Entered By: Worthy Keeler on 02/18/2021 12:52:17 Chad [name] ([id number]) -------------------------------------------------------------------------------- Physician  Orders Details Patient Name: Chad [name], Chad [name] Date of Service: 02/18/2021 11:15 AM Medical Record Number: [id number] Patient Account Number: [id number] Date of Birth/Sex: [birth year] (60 y.o. 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 Bathing/ Shower/ Hygiene o May shower; gently cleanse wound with antibacterial soap, rinse and pat dry prior to dressing wounds Wound Treatment Wound #8 - Ankle Wound Laterality: Left, Medial Cleanser: Normal Saline 1 x Per Day/30 Days Discharge Instructions: Wash your hands with soap and water. Remove old dressing, discard into plastic bag and place into trash. Cleanse the wound with Normal Saline prior to applying a clean dressing using gauze sponges, not tissues or cotton balls. Do not scrub or use excessive force. Pat dry using gauze sponges, not tissue or cotton balls. Peri-Wound Care: Desitin Maximum Strength Ointment 4 (oz) 1 x Per Day/30 Days Discharge Instructions: Zinc periwound Primary Dressing: Prisma 4.34 (in) 1 x Per Day/30 Days Discharge Instructions: Moisten w/normal saline or sterile water; Cover wound as directed. Do not remove from wound bed. Secondary Dressing: ABD Pad 5x9 (in/in) 1 x Per Day/30 Days Discharge Instructions: Cover with ABD pad Secondary Dressing: Kerlix 4.5 x 4.1 (in/yd) 1 x Per Day/30 Days Discharge Instructions: Apply Kerlix 4.5 x 4.1 (in/yd) as instructed Secured With: 42M Medipore H Soft Cloth Surgical Tape, 2x2 (in/yd) 1 x Per Day/30 Days Discharge Instructions: Secure kerlix Electronic Signature(s) Signed: 02/18/2021 4:44:39 PM By: Worthy Keeler PA-C Signed: 02/18/2021 4:47:50 PM By: Dolan Amen RN Entered By: Dolan Amen on 02/18/2021 11:52:59 Chad [name]  ([id number]) -------------------------------------------------------------------------------- Problem List Details Patient Name: Chad [name] Date of Service: 02/18/2021 11:15 AM Medical Record Number: [id number] Patient Account Number: [id number] Date of Birth/Sex: [birth year] (60 y.o. M) Treating RN: Dolan Amen Primary Care Provider: Clayborn Bigness Other Clinician: Referring Provider: Clayborn Bigness Treating Provider/Extender: Skipper Cliche in Treatment: 26 Active Problems ICD-10 Encounter Code Description Active Date MDM Diagnosis I87.2 Venous insufficiency (chronic) (peripheral) 08/14/2020 No Yes L97.322 Non-pressure chronic ulcer of left ankle with fat layer exposed 08/14/2020 No Yes L97.412 Non-pressure chronic ulcer of right heel and midfoot with fat layer 08/14/2020 No Yes exposed L97.512 Non-pressure chronic ulcer of other part of right foot with fat layer 08/14/2020 No Yes exposed 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 M) Treating RN: Dolan Amen Primary Care Provider: Clayborn Bigness Other Clinician: Referring Provider: Clayborn Bigness Treating Provider/Extender: Skipper Cliche in Treatment: 26 Subjective Chief Complaint Information obtained from Patient Left ankle and right foot and heel ulcers History of Present Illness (HPI) 10/08/18 on evaluation today patient actually presents to our office for initial evaluation concerning  wounds  that he has of the bilateral lower extremities. 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 He also has bilateral venous stasis. He apparently has been homeless currently following discharge from the hospital apparently he has been placed at [facility] which is is a skilled nursing facility locally. Nonetheless fortunately he does not show any signs of infection at this time which is good news. In fact several of the wound actually appears to be showing some signs of improvement already in my pinion. There are a couple areas in the left leg in particular there likely gonna require some sharp debridement to help clear away some necrotic tissue and help with more sufficient healing. No fevers, chills, nausea, or vomiting noted at this time. 10/15/18 on evaluation today patient actually appears to be doing very well in regard to his bilateral lower extremities. He's been tolerating the dressing changes without complication. Fortunately there does not appear to be any evidence of active infection at this time which is great news. Overall I'm actually very pleased with how this has progressed in just one visits time. Readmission: 08/14/2020 upon evaluation today patient presents for re-evaluation here in our clinic. He is having issues with his left ankle region as well as his right toe and his right heel. He tells me that the toe and heel actually began as a area that was itching that he was scratching and then subsequently opened up into wounds. These may have been abscess areas I presume based on what I am seeing currently. With regard to his left ankle region he tells me this was a similar type occurrence although he does have venous stasis this very well may be more of a venous leg ulcer more than anything. Nonetheless I do believe  that the patient would benefit from appropriate and aggressive wound care to try to help get things under better control here. He does have history of a stroke on the left side affecting him to some degree there that he is able to stand although he does have some residual weakness. Otherwise again the patient does have chronic venous insufficiency as previously noted. His arterial studies most recently obtained showed that he had an ABI on the right of 1.16 with a TBI of 0.52 and on the left and ABI of 1.14 with a TBI of 0.81. That was obtained on 06/19/2020. 08/28/2020 upon evaluation today patient appears to be doing decently well in regard to his wounds in general. He has been tolerating the dressing changes without complication. Fortunately there does not appear to be any signs of active infection which is great news. With that being said I think the [facility] is doing a good job I would recommend that we likely continue with that currently. 09/11/2020 upon evaluation today patient's wounds did not appear to be doing too poorly but again he is not really showing signs of significant improvement with regard to any of the wounds on the right. None of them have Hydrofera Blue on them I am not exactly sure why this is not being followed as the facility did not contact us to let us know of any issues with obtaining dressings or otherwise. With that being said he is supposed to be using Hydrofera Blue on both of the wounds on the right foot as well as the ankle wound on the left side.  09/18/2020 upon evaluation today patient appears to be doing poorly with regard to his wounds. Again right now the left ankle in particular showed signs of extreme maceration. Apparently he was told by someone with staff at [place] they could not get the [facility]. With that being said this is something that is never been relayed to [place] one way or another. Also the patient subsequently has not supposed to  have a border gauze dressing on. He should have an ABD pad and roll gauze to secure as this drains much too much just to have a border gauze dressing to cover. Nonetheless the fact that they are not using the appropriate dressing is directly causing deterioration of the left ankle wound it is significantly worse today compared to what it was previous. I did attempt to call [facility] while the patient was here I called three times and got no one to even pick up the phone. After this I had my for an office coordinator call and she was able to finally get through and leave a message with the D ON as of dictation of this note which is roughly about an hour and a half later I still have not been able to speak with anyone at the facility. 09/25/2020 upon evaluation today patient actually showing signs of good improvement which is excellent news. He has been tolerating the dressing changes without complication. Fortunately there is no signs of active infection which is great news. No fevers, chills, nausea, vomiting, or diarrhea. I do feel like the facility has been doing a much better job at taking care of him as far as the dressings are concerned. However the director of nursing never did call me back. 10/09/2020 upon evaluation today patient appears to be doing well with regard to his wound. The toe ulcer did require some debridement but the other 2 areas actually appear to be doing quite well. 10/19/2020 upon evaluation today patient actually appears to be doing very well in regard to his wounds. In fact the heel does appear to be completely healed. The toe is doing better in the medial ankle on the left is also doing better. Overall I think he is headed in the right direction. 10/26/2020 upon evaluation today patient appears to be doing well with regard to his wound. He is showing signs of improvement which is great news and overall I am very pleased with where things stand today. No fevers, chills,  nausea, vomiting, or diarrhea. 11/02/2020 upon evaluation today patient appears to be doing well with regard to his wounds. He has been tolerating the dressing changes without complication overall I am extremely pleased with where things stand today. He in regard to the toe is almost completely healed and the medial [facility] [place] ([id number]) ankle on the left is doing much better. 11/09/2020 upon evaluation today patient appears to be doing a little poorly in regard to his left medial ankle ulcer. Fortunately there does not appear to be any signs of systemic infection but unfortunately locally he does appear to be infected in fact he has blue-green drainage consistent with Pseudomonas. 11/16/2020 upon evaluation today patient appears to be doing well with regard to his wound. It actually appears to be doing better. I did place him on gentamicin cream since the Cipro was actually resistant even though he was positive for Pseudomonas on culture. Overall I think that he does seem to be doing better though I am unsure whether or not they have actually been  putting the cream on. The patient is not sure that we did talk to the nurse directly and she was going to initiate that treatment. Fortunately there does not appear to be any signs of active infection at this time. No fevers, chills, nausea, vomiting, or diarrhea. 4/28; the area on the right second toe is close to healed. Left medial ankle required debridement 12/07/2020 upon evaluation today patient appears to be doing well with regard to his wounds. In fact the right second toe appears to be completely healed which is great news. Fortunately there does not appear to be any signs of active infection at this time which is also great news. I think we can probably discontinue the gentamicin on top of everything else. 12/14/2020 upon evaluation today patient appears to be doing well with regard to his wound. He is making good progress and overall very  pleased with where things stand today. There is no signs of active infection at this time which is great news. 12/28/2020 upon evaluation today patient appears to be doing well with regard to his wounds. He has been tolerating the dressing changes without complication. Fortunately there is no signs of active infection at this time. No fevers, chills, nausea, vomiting, or diarrhea. 12/28/2020 upon evaluation today patient's wound bed actually showed signs of excellent improvement. He has great epithelization and granulation I do not see any signs of infection overall I am extremely pleased with where things stand at this point. No fevers, chills, nausea, vomiting, or diarrhea. 01/11/2021 upon evaluation today patient appears to be doing well with regard to his wound on his leg. He has been tolerating the dressing changes without complication. Fortunately there does not appear to be any signs of active infection which is great news. No fevers, chills, nausea, vomiting, or diarrhea. 01/25/2021 upon evaluation today patient appears to be doing well with regard to his wound. He has been tolerating the dressing changes without complication. Fortunately the collagen seems to be doing a great job which is excellent news. No fevers, chills, nausea, vomiting, or diarrhea. 02/08/2021 upon evaluation today patient's wound is actually looking a little bit worse especially in the periwound compared to previous. Fortunately there does not appear to be any signs of infection which is great news with that being said he does have some irritation around the periphery of the wound which has me more concerned. He actually had a dressing on that had not been changed in 3 days. He also is supposed to have daily dressing changes. With regard to the dressing applied he had a silver alginate dressing and silver collagen is what is recommended and ordered. He also had no Desitin around the edges of the wound in the periwound region  although that is on the order inspect to be done as well. In general I was very concerned I did contact [facility] actually spoke with Magda Paganini who is the scheduling individual and subsequently she stated that she would pass the information to the D ON apparently the D ON was not available to talk to me when I call today. 02/18/2021 upon evaluation today patient's wound is actually showing signs of improvement. Fortunately there does not appear to be any evidence of infection which is great news overall I am extremely pleased with where things stand today. No fevers, chills, nausea, vomiting, or diarrhea. Objective Constitutional Well-nourished and well-hydrated in no acute distress. Vitals Time Taken: 11:38 AM, Height: 67 in, Weight: 160 lbs, BMI: 25.1, Temperature: 98.3 F, Pulse:  82 bpm, Respiratory Rate: 18 breaths/min, Blood Pressure: 111/65 mmHg. Respiratory normal breathing without difficulty. Psychiatric this patient is able to make decisions and demonstrates good insight into disease process. Alert and Oriented x 3. pleasant and cooperative. General Notes: Patient's wound did have silver alginate not silver collagen on the wound today upon inspection. With that being said no one from the facility is contacted me after I contacted the DO in last week. I had to leave a message with the transportation individual but nonetheless unfortunately I have not heard anything back yet they still continue to use alginate on the area again the best treatment option here is collagen which I think will help it heal faster I discussed that with the patient I put in the notes and I think that still what we need to stick with at this point. Integumentary (Hair, Skin) Wound #8 status is Open. Original cause of wound was Gradually Appeared. The date acquired was: 07/12/2019. The wound has been in treatment 26 weeks. The wound is located on the Left,Medial Ankle. The wound measures 2.5cm length x  1.4cm width x 0.1cm depth; 2.749cm^2 Chad [name], Chad [name] ([id number]) area and 0.275cm^3 volume. There is Fat Layer (Subcutaneous Tissue) exposed. There is no tunneling or undermining noted. There is a medium amount of serosanguineous drainage noted. The wound margin is flat and intact. There is large (67-100%) red, pink granulation within the wound bed. There is a small (1-33%) amount of necrotic tissue within the wound bed including Adherent Slough. 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 There was a Excisional Skin/Subcutaneous Tissue Debridement with a total area of 3.5 sq cm performed by Tommie Sams., PA-C. With the following instrument(s): Curette to remove Viable and Non-Viable tissue/material. Material removed includes Subcutaneous Tissue, Slough, and Biofilm. A time out was conducted at 11:50, prior to the start of the procedure. A Minimum amount of bleeding was controlled with Pressure. The procedure was tolerated well. Post Debridement Measurements: 2.5cm length x 1.4cm width x 0.2cm depth; 0.55cm^3 volume. Character of Wound/Ulcer Post Debridement is stable. Severity of Tissue Post Debridement is: Fat layer exposed. Post procedure Diagnosis Wound #8: Same as Pre-Procedure Plan Follow-up Appointments: Return Appointment in 1 week. Bathing/ Shower/ Hygiene: May shower; gently cleanse wound with antibacterial soap, rinse and pat dry prior to dressing wounds WOUND #8: -  Ankle Wound Laterality: Left, Medial Cleanser: Normal Saline 1 x Per Day/30 Days Discharge Instructions: Wash your hands with soap and water. Remove old dressing, discard into plastic bag and place into trash. Cleanse the wound with Normal Saline prior to applying a clean dressing using gauze sponges, not tissues or cotton balls. Do not scrub or use excessive force. Pat dry using gauze sponges, not tissue or cotton balls. Peri-Wound Care: Desitin Maximum Strength Ointment 4 (oz) 1 x Per Day/30 Days Discharge Instructions: Zinc periwound Primary Dressing: Prisma 4.34 (in) 1 x Per Day/30 Days Discharge Instructions: Moisten w/normal saline or sterile water; Cover wound as directed. Do not remove from wound bed. Secondary Dressing: ABD Pad 5x9 (in/in) 1 x Per Day/30 Days  Discharge Instructions: Cover with ABD pad Secondary Dressing: Kerlix 4.5 x 4.1 (in/yd) 1 x Per Day/30 Days Discharge Instructions: Apply Kerlix 4.5 x 4.1 (in/yd) as instructed Secured With: 96M Medipore H Soft Cloth Surgical Tape, 2x2 (in/yd) 1 x Per Day/30 Days Discharge Instructions: Secure kerlix 1. I would recommend currently that we going to continue with the wound care measures as before and the patient is in agreement with plan. This includes the use of the silver collagen dressing which I think is can be the best way to go. 2. 1 recommend as well that we continue with zinc around the edges of the wound followed by an ABD pad and roll gauze. 3. I am also going to suggest patient continue to monitor for any signs of worsening with regard to infection. If he has any issues he should let me know. We will see patient back for reevaluation in 1 week here in the clinic. If anything worsens or changes patient will contact our office for additional recommendations. Chad [name], Chad [name] ([id number]) Electronic Signature(s) Signed: 02/18/2021 12:53:01 PM By: Worthy Keeler PA-C Entered By: Worthy Keeler on 02/18/2021  12:53:01 Chad [name] ([id number]) -------------------------------------------------------------------------------- SuperBill Details Patient Name: Chad [name] Date of Service: 02/18/2021 Medical Record Number: [id number] Patient Account Number: [id number] Date of Birth/Sex: 1961-04-07 (60 y.o. M) Treating RN: Dolan Amen Primary Care Provider: Clayborn Bigness Other Clinician: Referring Provider: Clayborn Bigness Treating Provider/Extender: Skipper Cliche in Treatment: 26 Diagnosis Coding ICD-10 Codes Code Description I87.2 Venous insufficiency (chronic) (peripheral) L97.322 Non-pressure chronic ulcer of left ankle with fat layer exposed L97.412 Non-pressure chronic ulcer of right heel and midfoot with fat layer exposed L97.512 Non-pressure chronic ulcer of other part of right foot with fat layer exposed 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 side Facility Procedures CPT4 Code: [id number] Description: [id number] - DEB SUBQ TISSUE 20 SQ CM/< Modifier: Quantity: 1 CPT4 Code: Description: ICD-10 Diagnosis Description L97.322 Non-pressure chronic ulcer of left ankle with fat layer exposed Modifier: Quantity: Physician Procedures CPT4 Code: [id number] Description: 11042 - WC PHYS SUBQ TISS 20 SQ CM Modifier: Quantity: 1 CPT4 Code: Description: ICD-10 Diagnosis Description [id number] Non-pressure chronic ulcer of left ankle with fat layer exposed Modifier: Quantity: Electronic Signature(s) Signed: 02/18/2021 12:58:15 PM By: Worthy Keeler PA-C Entered By: Worthy Keeler on 02/18/2021 12:58:15

## 2021-02-19 ENCOUNTER — Other Ambulatory Visit: Payer: Self-pay | Admitting: *Deleted

## 2021-02-19 DIAGNOSIS — C182 Malignant neoplasm of ascending colon: Secondary | ICD-10-CM

## 2021-02-19 DIAGNOSIS — C661 Malignant neoplasm of right ureter: Secondary | ICD-10-CM

## 2021-02-20 ENCOUNTER — Inpatient Hospital Stay: Payer: Medicaid Other

## 2021-02-20 ENCOUNTER — Encounter: Payer: Self-pay | Admitting: Internal Medicine

## 2021-02-20 ENCOUNTER — Other Ambulatory Visit: Payer: Self-pay

## 2021-02-20 ENCOUNTER — Inpatient Hospital Stay (HOSPITAL_BASED_OUTPATIENT_CLINIC_OR_DEPARTMENT_OTHER): Payer: Medicaid Other | Admitting: Internal Medicine

## 2021-02-20 DIAGNOSIS — C182 Malignant neoplasm of ascending colon: Secondary | ICD-10-CM

## 2021-02-20 DIAGNOSIS — C661 Malignant neoplasm of right ureter: Secondary | ICD-10-CM | POA: Diagnosis not present

## 2021-02-20 DIAGNOSIS — Z95828 Presence of other vascular implants and grafts: Secondary | ICD-10-CM

## 2021-02-20 DIAGNOSIS — Z515 Encounter for palliative care: Secondary | ICD-10-CM

## 2021-02-20 DIAGNOSIS — Z1509 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: Secondary | ICD-10-CM

## 2021-02-20 DIAGNOSIS — Z7189 Other specified counseling: Secondary | ICD-10-CM

## 2021-02-20 DIAGNOSIS — Z5112 Encounter for antineoplastic immunotherapy: Secondary | ICD-10-CM | POA: Diagnosis not present

## 2021-02-20 LAB — CBC WITH DIFFERENTIAL/PLATELET
Abs Immature Granulocytes: 0.02 10*3/uL (ref 0.00–0.07)
Basophils Absolute: 0.1 10*3/uL (ref 0.0–0.1)
Basophils Relative: 1 %
Eosinophils Absolute: 0.4 10*3/uL (ref 0.0–0.5)
Eosinophils Relative: 6 %
HCT: 37.4 % — ABNORMAL LOW (ref 39.0–52.0)
Hemoglobin: 11.6 g/dL — ABNORMAL LOW (ref 13.0–17.0)
Immature Granulocytes: 0 %
Lymphocytes Relative: 21 %
Lymphs Abs: 1.5 10*3/uL (ref 0.7–4.0)
MCH: 24.5 pg — ABNORMAL LOW (ref 26.0–34.0)
MCHC: 31 g/dL (ref 30.0–36.0)
MCV: 79.1 fL — ABNORMAL LOW (ref 80.0–100.0)
Monocytes Absolute: 0.6 10*3/uL (ref 0.1–1.0)
Monocytes Relative: 8 %
Neutro Abs: 4.5 10*3/uL (ref 1.7–7.7)
Neutrophils Relative %: 64 %
Platelets: 180 10*3/uL (ref 150–400)
RBC: 4.73 MIL/uL (ref 4.22–5.81)
RDW: 14.9 % (ref 11.5–15.5)
WBC: 7.1 10*3/uL (ref 4.0–10.5)
nRBC: 0 % (ref 0.0–0.2)

## 2021-02-20 LAB — COMPREHENSIVE METABOLIC PANEL
ALT: 21 U/L (ref 0–44)
AST: 35 U/L (ref 15–41)
Albumin: 3.7 g/dL (ref 3.5–5.0)
Alkaline Phosphatase: 102 U/L (ref 38–126)
Anion gap: 6 (ref 5–15)
BUN: 24 mg/dL — ABNORMAL HIGH (ref 8–23)
CO2: 24 mmol/L (ref 22–32)
Calcium: 8.4 mg/dL — ABNORMAL LOW (ref 8.9–10.3)
Chloride: 104 mmol/L (ref 98–111)
Creatinine, Ser: 0.96 mg/dL (ref 0.61–1.24)
GFR, Estimated: >60 mL/min (ref >60)
Glucose, Bld: 88 mg/dL (ref 70–99)
Potassium: 4.3 mmol/L (ref 3.5–5.1)
Sodium: 134 mmol/L — ABNORMAL LOW (ref 135–145)
Total Bilirubin: 0.3 mg/dL (ref 0.3–1.2)
Total Protein: 7.5 g/dL (ref 6.5–8.1)

## 2021-02-20 LAB — TSH: TSH: 2.551 u[IU]/mL (ref 0.350–4.500)

## 2021-02-20 MED ORDER — SODIUM CHLORIDE 0.9 % IV SOLN
200.0000 mg | Freq: Once | INTRAVENOUS | Status: AC
  Administered 2021-02-20: 200 mg via INTRAVENOUS
  Filled 2021-02-20: qty 8

## 2021-02-20 MED ORDER — SODIUM CHLORIDE 0.9% FLUSH
10.0000 mL | Freq: Once | INTRAVENOUS | Status: AC
  Administered 2021-02-20: 10 mL via INTRAVENOUS
  Filled 2021-02-20: qty 10

## 2021-02-20 MED ORDER — SODIUM CHLORIDE 0.9 % IV SOLN
Freq: Once | INTRAVENOUS | Status: AC
  Filled 2021-02-20: qty 250

## 2021-02-20 MED ORDER — HEPARIN SOD (PORK) LOCK FLUSH 100 UNIT/ML IV SOLN
500.0000 [IU] | Freq: Once | INTRAVENOUS | Status: DC | PRN
  Filled 2021-02-20: qty 5

## 2021-02-20 MED ORDER — HEPARIN SOD (PORK) LOCK FLUSH 100 UNIT/ML IV SOLN
500.0000 [IU] | Freq: Once | INTRAVENOUS | Status: AC
  Administered 2021-02-20: 500 [IU] via INTRAVENOUS
  Filled 2021-02-20: qty 5

## 2021-02-20 NOTE — Progress Notes (Signed)
[place] NOTE  Patient Care Team: [name], MD as PCP - General (Internal Medicine) Cammie Sickle, MD as Consulting Physician (Internal Medicine) Bary Castilla Forest Gleason, MD as Consulting Physician (General Surgery)  CHIEF COMPLAINTS/PURPOSE OF CONSULTATION: Urothelial cancer  #  Oncology History Overview Note  # SEP-OCT 2019-right renal pelvis/ ureteral [cytology positive HIGH grade urothelial carcinoma [Dr.Brandon]    # NOV 24th 2019-Keytruda [consent]  # April 2022- colonoscopy [Dr.Anna;incidental PET- sigmoid uptake] ~25 mm polypoid lesion-biopsy mucinous carcinoma- s/p Dr.Byrnett [facility] 2022]-repeat colonoscopy in 6 months [multiple comorbidities]  #Right ureteral obstruction status post stent placement  # JAN 2019- Right Colon ca [ T4N1]  [[facility]]; NO adjuvant therapy  # Hep C/ # stroke of left side/weakness-2018 Nov [[place]]; active smoker  DIAGNOSIS: # 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  Is alone.  Chad [name] 61 y.o.  male above history of stage IV-ureteral cancer/history of stage III colon cancer right side; recurrent sigmoid colon cancer [un-resected/under surveillance] and multiple other comorbidities currently on Keytruda is here for follow-up.  Patient denies any blood in stools or black or stools but denies any abdominal pain nausea vomiting.  No worsening constipation.  Chronic shortness of breath on exertion; not any worse.  Review of Systems  Constitutional:  Positive for malaise/fatigue. Negative for  chills, diaphoresis, fever and weight loss.  HENT:  Negative for nosebleeds and sore throat.   Eyes:  Negative for double vision.  Respiratory:  Positive for cough and shortness of breath. Negative for hemoptysis and wheezing.   Cardiovascular:  Negative for chest pain, palpitations and orthopnea.  Gastrointestinal:  Positive for constipation. Negative for abdominal pain, blood in stool, diarrhea, heartburn, melena, nausea and vomiting.  Genitourinary:  Negative for dysuria, frequency and urgency.  Musculoskeletal:  Positive for back pain and joint pain.  Skin: Negative.  Negative for itching and rash.  Neurological:  Positive for focal weakness. Negative for dizziness, tingling, weakness and headaches.       Chronic left-sided weakness upper than lower extremity.  Endo/Heme/Allergies:  Does not bruise/bleed easily.  Psychiatric/Behavioral:  Negative for depression. The patient is not nervous/anxious and does not have insomnia.     MEDICAL HISTORY:  Past Medical History:  Diagnosis Date   Anemia    Anxiety    ARF (acute respiratory failure) (HCC)    Bladder cancer (HCC)    COPD (chronic obstructive pulmonary disease) ([place])    Depression    Dysphagia    Family history of colon cancer    Family history of kidney cancer    Family history of leukemia    Family history of prostate cancer    GERD (gastroesophageal reflux disease)    Hepatitis    chronic hep c   Hydronephrosis    Hydronephrosis with ureteral stricture    Hyperlipidemia    Knee pain    Left   Malignant neoplasm of colon ([place])    Nerve pain    Peripheral vascular disease ([place])  Prostate cancer ([place])    Stroke [facility])    Urinary frequency    Venous hypertension of both lower extremities     SURGICAL HISTORY: Past Surgical History:  Procedure Laterality Date   COLON SURGERY     En bloc extended right hemicolectomy 07/2017   COLONOSCOPY WITH PROPOFOL N/A 11/06/2020   Procedure: COLONOSCOPY WITH PROPOFOL;   Surgeon: Jonathon Bellows, MD;  [facility] END[facility]COPY;  Service: Gastroenterology;  Laterality: N/A;   CYSTOSCOPY W/ RETROGRADES Right 08/30/2018   Procedure: CYSTOSCOPY WITH RETROGRADE PYELOGRAM;  Surgeon: Hollice Espy, MD;  Location: [facility] ORS;  Service: Urology;  Laterality: Right;   CYSTOSCOPY WITH STENT PLACEMENT Right 04/25/2018   Procedure: CYSTOSCOPY WITH STENT PLACEMENT;  Surgeon: Hollice Espy, MD;  Location: [facility] ORS;  Service: Urology;  Laterality: Right;   CYSTOSCOPY WITH STENT PLACEMENT Right 08/30/2018   Procedure: [place] Exchange;  Surgeon: Hollice Espy, MD;  Location: [facility] ORS;  Service: Urology;  Laterality: Right;   CYSTOSCOPY WITH STENT PLACEMENT Right 03/07/2019   Procedure: CYSTOSCOPY WITH STENT Exchange;  Surgeon: Hollice Espy, MD;  Location: [facility] ORS;  Service: Urology;  Laterality: Right;   CYSTOSCOPY WITH STENT PLACEMENT Right 11/21/2019   Procedure: CYSTOSCOPY WITH STENT Exchange;  Surgeon: Hollice Espy, MD;  Location: [facility] ORS;  Service: Urology;  Laterality: Right;   LOWER EXTREMITY ANGIOGRAPHY Left 05/23/2019   Procedure: LOWER EXTREMITY ANGIOGRAPHY;  Surgeon: Algernon Huxley, MD;  Location: [place] CV LAB;  Service: Cardiovascular;  Laterality: Left;   LOWER EXTREMITY ANGIOGRAPHY Right 05/30/2019   Procedure: LOWER EXTREMITY ANGIOGRAPHY;  Surgeon: Algernon Huxley, MD;  Location: [place] CV LAB;  Service: Cardiovascular;  Laterality: Right;   LOWER EXTREMITY ANGIOGRAPHY Right 02/13/2020   Procedure: LOWER EXTREMITY ANGIOGRAPHY;  Surgeon: Algernon Huxley, MD;  Location: [place] CV LAB;  Service: Cardiovascular;  Laterality: Right;   LOWER EXTREMITY ANGIOGRAPHY Left 02/20/2020   Procedure: LOWER EXTREMITY ANGIOGRAPHY;  Surgeon: Algernon Huxley, MD;  Location: [place] CV LAB;  Service: Cardiovascular;  Laterality: Left;   PORTA CATH INSERTION N/A 02/28/2019   Procedure: PORTA CATH INSERTION;  Surgeon: Algernon Huxley, MD;  Location: [place]  CV LAB;  Service: Cardiovascular;  Laterality: N/A;   tumor removed       SOCIAL HISTORY: Social History   Socioeconomic History   Marital status: Single    Spouse name: Not on file   Number of children: Not on file   Years of education: Not on file   Highest education level: Not on file  Occupational History   Not on file  Tobacco Use   Smoking status: Every Day    Packs/day: 1.00    Types: Cigarettes   Smokeless tobacco: Never  Vaping Use   Vaping Use: Never used  Substance and Sexual Activity   Alcohol use: Not Currently   Drug use: Not Currently   Sexual activity: Not Currently  Other Topics Concern   Not on file  Social History Narrative    used to live [place]; moved  To [place]- end of April 2019; in Nursing home; 1pp/day; quit alcohol. 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        Family- dad- prostate ca [at 109y]; brother- 14 died of prostate cancer; brother- 3- no cancers [[place]]; sonGerald [name] [[place]];[name]-32y prostate ca [facility] [place]]; daughter- 56 [[place]]; another daughter 27 [[place]/addict]. will refer genetics counseling. Given MSI- abnormal; highly suspicious of Lynch syndrome.  Patient's son Chad [name] aware of  high possible lynch syndrome.   Social Determinants of Health   Financial Resource Strain: Not on file  Food Insecurity: Not on file  Transportation Needs: Not on file  Physical Activity: Not on file  Stress: Not on file  Social Connections: Not on file  Intimate Partner Violence: Not on file    FAMILY HISTORY: Family History  Problem Relation Age of Onset   Prostate cancer Father 90   Cancer Brother 65       unsure type   Cancer Paternal Uncle        unsure type   Cancer Maternal Grandmother        unsure type   Cancer Paternal Grandmother        unsure type   Kidney cancer Paternal Grandfather    Cancer Other        unsure types   Leukemia Son    Cancer Son        other cancers, possibly colon    ALLERGIES:  is allergic to  penicillins.  MEDICATIONS:  Current Outpatient Medications  Medication Sig Dispense Refill   acetaminophen (TYLENOL) 325 MG tablet Take 650 mg by mouth 3 (three) times daily.      aspirin EC 81 MG tablet Take 1 tablet (81 mg total) by mouth daily. 150 tablet 2   atorvastatin (LIPITOR) 10 MG tablet Take 1 tablet (10 mg total) by mouth daily. 30 tablet 11   carboxymethylcellulose 1 % ophthalmic solution Apply 1 drop to eye 2 (two) times daily.     Cholecalciferol (VITAMIN D) 50 MCG (2000 UT) CAPS Take by mouth.     clopidogrel (PLAVIX) 75 MG tablet Take 1 tablet (75 mg total) by mouth daily. 30 tablet 11   cyclobenzaprine (FLEXERIL) 10 MG tablet Take 10 mg by mouth at bedtime.     escitalopram (LEXAPRO) 5 MG tablet Take 5 mg by mouth daily.     gabapentin (NEURONTIN) 100 MG capsule Take 100 mg by mouth 3 (three) times daily.     mirtazapine (REMERON) 7.5 MG tablet Take 7.5 mg by mouth at bedtime.     Oxycodone HCl 10 MG TABS Take 10 mg by mouth every 6 (six) hours as needed for pain.     pantoprazole (PROTONIX) 40 MG tablet Take 40 mg by mouth daily.     RESTORE CALCIUM ALGINATE EX Apply topically. Note: Dressing located on left ankle. Facility cleaning wound "every day shift and applying wound cleanser and appication of calcium Alginate and cover with dry dressing and wrapped with Kerlix"     senna-docusate (SENOKOT-S) 8.6-50 MG tablet Take 1 tablet by mouth daily.     No current facility-administered medications for this visit.      Marland Kitchen  PHYSICAL EXAMINATION: ECOG PERFORMANCE STATUS: 1 - Symptomatic but completely ambulatory  Vitals:   02/20/21 0834  BP: 115/78  Pulse: 79  Resp: 18  Temp: 98.2 F (36.8 C)   Filed Weights   02/20/21 0834  Weight: 162 lb (73.5 kg)    Physical Exam Constitutional:      Comments: In wheelchair. Cachectic.  Appears older than his stated age.  Alone.  HENT:     Head: Normocephalic and atraumatic.     Mouth/Throat:     Pharynx: No  oropharyngeal exudate.  Eyes:     Pupils: Pupils are equal, round, and reactive to light.  Cardiovascular:     Rate and Rhythm: Normal rate and regular rhythm.  Pulmonary:  Effort: No respiratory distress.     Breath sounds: No wheezing.  Abdominal:     General: Bowel sounds are normal. There is no distension.     Palpations: Abdomen is soft. There is no mass.     Tenderness: no abdominal tenderness There is no guarding or rebound.  Musculoskeletal:        General: No tenderness. Normal range of motion.     Cervical back: Normal range of motion and neck supple.  Skin:    General: Skin is warm.     Comments: Bilateral lower extremity ulcerations noted.  Pulses intact bilaterally.  Neurological:     Mental Status: He is oriented to person, place, and time.     Comments: Sleepy but easily arousable.  Chronic weakness left upper and lower extremity.  Psychiatric:        Mood and Affect: Affect normal.     LABORATORY DATA:  I have reviewed the data as listed Lab Results  Component Value Date   WBC 7.1 02/20/2021   HGB 11.6 (L) 02/20/2021   HCT 37.4 (L) 02/20/2021   MCV 79.1 (L) 02/20/2021   PLT 180 02/20/2021   Recent Labs    03/08/20 0905 03/29/20 0824 04/19/20 0858 05/10/20 0915 01/07/21 0823 01/30/21 0815 02/20/21 0815  NA 137 139 134*   < > 135 135 134*  K 4.4 4.1 3.8   < > 4.3 4.4 4.3  CL 103 104 101   < > 101 103 104  CO2 '25 24 26   ' < > '27 25 24  ' GLUCOSE 88 94 140*   < > 87 90 88  BUN '18 21 19   ' < > 19 21 24*  CREATININE 0.85 0.76 0.92   < > 0.82 0.81 0.96  CALCIUM 8.8* 8.6* 8.4*   < > 8.5* 8.7* 8.4*  GFRNONAA >60 >60 >60   < > >60 >60 >60  GFRAA >60 >60 >60  --   --   --   --   PROT 8.0 7.7 7.8   < > 7.6 7.6 7.5  ALBUMIN 3.8 3.6 3.7   < > 3.7 3.8 3.7  AST 32 28 33   < > 32 35 35  ALT '26 20 24   ' < > '19 23 21  ' ALKPHOS 127* 118 115   < > 94 113 102  BILITOT 0.6 0.4 0.4   < > 0.4 0.7 0.3   < > = values in this interval not displayed.    RADIOGRAPHIC  STUDIES: I have personally reviewed the radiological images as listed and agreed with the findings in the report. No results found.  ASSESSMENT & PLAN:   Ureteral cancer, right ([place]) # High-grade urothelial cancer/cytology; likely of the right renal pelvis /upper ureter. JAN 5th, 2022- CT findings worrisome for progressive right renal pelvis and ureteral tumor and mass suspicious for colonic neoplasm involving the descending colon sigmoid colon junction region [see below].  Mild soft tissue thickening around the distal right ureter could reflect ureteral tumor; FEB 2022-  RIGHT renal pelvis and bladder are not evaluable by FDG PET imaging due to high activity of radiotracer within the urine-STABLE.   but see below re: sigmoid mass/colon cancer.  # proceed with  [place] today; Labs today reviewed; Labs today reviewed;  acceptable for treatment today.   #Descending colon mass- PET scan- FEB 2022-uptake in the cecum;  [history of Lynch syndrome]-colonoscopy 25 mm polyp; biopsy mucinous carcinoma- given risk vs benefits -hold  surgery for now; re-eval with colonoscopy in October-STABLE.   # Iron deficiency anemia-hemoglobin 11.5; February 2022 ron studies/ferritin-LOW; hold Venofer with infusions;STABLE.   # Bilateral LE ulcers-s/p wound care evaluation; s/p PTCA with Dr.Dew. bil Arterial Dopp-wnl-STABLE.   #DISPOSITION: #  [name];  # follow up in 3 weeks-;MD labs-cbc/cmp;  Keytruda; - Dr.B.   All questions were answered. The patient knows to call the clinic with any problems, questions or concerns.    Cammie Sickle, MD 02/20/2021 7:46 PM

## 2021-02-20 NOTE — Assessment & Plan Note (Addendum)
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 JAN 5th, 2022- CT findings worrisome for progressive right renal pelvis and ureteral tumor and mass suspicious for colonic neoplasm involving the descending colon sigmoid colon junction region [see below].  Mild soft tissue thickening around the distal right ureter could reflect ureteral tumor; FEB 2022-  RIGHT renal pelvis and bladder are not evaluable by FDG PET imaging due to high activity of radiotracer within the urine-STABLE.   but see below re: sigmoid mass/colon cancer.  # proceed with  [place] today; Labs today reviewed; Labs today reviewed;  acceptable for treatment today.   #Descending colon mass- PET scan- FEB 2022-uptake in the cecum;  [history of Lynch syndrome]-colonoscopy 25 mm polyp; biopsy mucinous carcinoma- given risk vs benefits -hold surgery for now; re-eval with colonoscopy in October-STABLE.   # Iron deficiency anemia-hemoglobin 11.5; February 2022 ron studies/ferritin-LOW; hold Venofer with infusions;STABLE.   # Bilateral LE ulcers-s/p wound care evaluation; s/p PTCA with Dr.Dew. bil Arterial Dopp-wnl-STABLE.   #DISPOSITION: #  Chad [name];  # follow up in 3 weeks-;MD labs-cbc/cmp;  Keytruda; - Dr.B.

## 2021-02-25 ENCOUNTER — Ambulatory Visit: Payer: Medicaid Other | Admitting: Physician Assistant

## 2021-02-27 ENCOUNTER — Other Ambulatory Visit: Payer: Self-pay

## 2021-02-27 ENCOUNTER — Encounter: Payer: Medicaid Other | Attending: Internal Medicine | Admitting: Internal Medicine

## 2021-02-27 DIAGNOSIS — I69354 Hemiplegia and hemiparesis following cerebral infarction affecting left non-dominant side: Secondary | ICD-10-CM | POA: Diagnosis not present

## 2021-02-27 DIAGNOSIS — I872 Venous insufficiency (chronic) (peripheral): Secondary | ICD-10-CM | POA: Insufficient documentation

## 2021-02-27 DIAGNOSIS — L97412 Non-pressure chronic ulcer of right heel and midfoot with fat layer exposed: Secondary | ICD-10-CM | POA: Insufficient documentation

## 2021-02-27 DIAGNOSIS — L97322 Non-pressure chronic ulcer of left ankle with fat layer exposed: Secondary | ICD-10-CM | POA: Diagnosis not present

## 2021-02-27 DIAGNOSIS — L97512 Non-pressure chronic ulcer of other part of right foot with fat layer exposed: Secondary | ICD-10-CM | POA: Insufficient documentation

## 2021-02-27 NOTE — Progress Notes (Signed)
LAITHAN, CONCHAS ([id number]) Visit Report for 02/27/2021 Arrival Information Details Patient Name: Chad [name], Chad [name] Date of Service: 02/27/2021 8:00 AM Medical Record Number: [id number] Patient Account Number: [id number] Date of Birth/Sex: 10-04-1960 (60 y.o. M) Treating RN: Carlene Coria Primary Care Jennie Hannay: Clayborn Bigness Other Clinician: Referring Nirvi Boehler: Clayborn Bigness Treating Kelsha Older/Extender: Yaakov Guthrie in Treatment: 28 Visit Information History Since Last Visit All ordered tests and consults were completed: No Patient Arrived: Wheel Chair Added or deleted any medications: No Arrival Time: 08:20 Any new allergies or adverse reactions: No Accompanied By: self Had a fall or experienced change in No Transfer Assistance: None activities of daily living that may affect Patient Identification Verified: Yes risk of falls: Secondary Verification Process Completed: Yes Signs or symptoms of abuse/neglect since last visito No Patient Requires Transmission-Based No Hospitalized since last visit: No Precautions: Implantable device outside of the clinic excluding No Patient Has Alerts: Yes cellular tissue based products placed in the center Patient Alerts: Patient on Blood since last visit: Thinner Has Dressing in Place as Prescribed: Yes ABI right 1.16 Pain Present Now: No ABI left 1.14 Electronic Signature(s) Signed: 02/27/2021 2:51:00 PM By: Carlene Coria RN Entered By: Carlene Coria on 02/27/2021 08:20:49 Chad [name] ([id number]) -------------------------------------------------------------------------------- Clinic Level of Care Assessment Details Patient Name: Chad [name] Date of Service: 02/27/2021 8:00 AM Medical Record Number: [id number] Patient Account Number: [id number] Date of Birth/Sex: 1960-09-19 (60 y.o. M) Treating RN: Carlene Coria Primary Care Zyanne Schumm: Clayborn Bigness Other Clinician: Referring Deleon Passe: Clayborn Bigness Treating Maddelynn Moosman/Extender: Yaakov Guthrie in Treatment: 28 Clinic Level of Care Assessment Items TOOL 4 Quantity Score X - Use when only an EandM is performed on FOLLOW-UP visit 1 0 ASSESSMENTS - Nursing Assessment / Reassessment X - Reassessment of Co-morbidities (includes updates in patient status) 1 10 X- 1 5 Reassessment of Adherence to Treatment Plan ASSESSMENTS - Wound and Skin Assessment / Reassessment X - Simple Wound Assessment / Reassessment - one wound 1 5 _0  - 0 Complex Wound Assessment / Reassessment - multiple wounds _1  - 0 Dermatologic / Skin Assessment (not related to wound area) ASSESSMENTS - Focused Assessment _2  - Circumferential Edema Measurements - multi extremities 0 _3  - 0 Nutritional Assessment / Counseling / Intervention _4  - 0 Lower Extremity Assessment (monofilament, tuning fork, pulses) _5  - 0 Peripheral Arterial Disease Assessment (using hand held doppler) ASSESSMENTS - Ostomy and/or Continence Assessment and Care _6  - Incontinence Assessment and Management 0 _7  - 0 Ostomy Care Assessment and Management (repouching, etc.) PROCESS - Coordination of Care X - Simple Patient / Family Education for ongoing care 1 15 _8  - 0 Complex (extensive) Patient / Family Education for ongoing care _9  - 0 Staff obtains Programmer, systems, Records, Test Results / Process Orders _10  - 0 Staff telephones HHA, Nursing Homes / Clarify orders / etc _11  - 0 Routine Transfer to another Facility (non-emergent condition) _12  - 0 Routine Hospital Admission (non-emergent condition) _13  - 0 New Admissions / Biomedical engineer / Ordering NPWT, Apligraf, etc. _14  - 0 Emergency Hospital Admission (emergent condition) X- 1 10 Simple Discharge Coordination _15  - 0 Complex (extensive) Discharge Coordination PROCESS - Special Needs _16  - Pediatric / Minor Patient Management 0 _17  - 0 Isolation Patient Management _18  - 0 Hearing / Language / Visual special needs _19  - 0 Assessment of Community assistance  (transportation, D/C planning, etc.) _20  - 0 Additional assistance / Altered mentation _21  - 0 Support Surface(s) Assessment (bed, cushion, seat, etc.) INTERVENTIONS - Wound Cleansing / Measurement Chad [name], Chad [name] (  [id number]) X- 1 5 Simple Wound Cleansing - one wound _0  - 0 Complex Wound Cleansing - multiple wounds X- 1 5 Wound Imaging (photographs - any number of wounds) _1  - 0 Wound Tracing (instead of photographs) X- 1 5 Simple Wound Measurement - one wound _2  - 0 Complex Wound Measurement - multiple wounds INTERVENTIONS - Wound Dressings X - Small Wound Dressing one or multiple wounds 1 10 _3  - 0 Medium Wound Dressing one or multiple wounds _4  - 0 Large Wound Dressing one or multiple wounds X- 1 5 Application of Medications - topical <[id number]>_6<\/[id number]>_2  - 0 Application of Medications - injection INTERVENTIONS - Miscellaneous _6  - External ear exam 0 _7  - 0 Specimen Collection (cultures, biopsies, blood, body fluids, etc.) _8  - 0 Specimen(s) / Culture(s) sent or taken to Lab for analysis _9  - 0 Patient Transfer (multiple staff / Civil Service fast streamer / Similar devices) _10  - 0 Simple Staple / Suture removal (25 or less) _11  - 0 Complex Staple / Suture removal (26 or more) _12  - 0 Hypo / Hyperglycemic Management (close monitor of Blood Glucose) _13  - 0 Ankle / Brachial Index (ABI) - do not check if billed separately X- 1 5 Vital Signs Has the patient been seen at the hospital within the last three years: Yes Total Score: 80 Level Of Care: New/Established - Level 3 Electronic Signature(s) Signed: 02/27/2021 2:51:00 PM By: Carlene Coria RN Entered By: Carlene Coria on 02/27/2021 08:47:18 Chad [name] ([id number]) -------------------------------------------------------------------------------- Encounter Discharge Information Details Patient Name: Chad [name] Date of Service: 02/27/2021 8:00 AM Medical Record Number: [id number] Patient Account Number: [id number] Date of Birth/Sex: 1960-12-16  (60 y.o. M) Treating RN: Carlene Coria Primary Care Willadeen Colantuono: Clayborn Bigness Other Clinician: Referring Lorianne Malbrough: Clayborn Bigness Treating Elmor Kost/Extender: Yaakov Guthrie in Treatment: 28 Encounter Discharge Information Items Discharge Condition: Stable Ambulatory Status: Wheelchair Discharge Destination: Home Transportation: Private Auto Accompanied By: self Schedule Follow-up Appointment: Yes Clinical Summary of Care: Patient Declined Electronic Signature(s) Signed: 02/27/2021 8:48:39 AM By: Carlene Coria RN Entered By: Carlene Coria on 02/27/2021 08:48:39 Chad [name] ([id number]) -------------------------------------------------------------------------------- Lower Extremity Assessment Details Patient Name: Chad [name] Date of Service: 02/27/2021 8:00 AM Medical Record Number: [id number] Patient Account Number: [id number] Date of Birth/Sex: Mar 21, 1961 (60 y.o. M) Treating RN: Carlene Coria Primary Care Andre Gallego: Clayborn Bigness Other Clinician: Referring Maybelline Kolarik: Clayborn Bigness Treating Markeise Mathews/Extender: Yaakov Guthrie in Treatment: 28 Edema Assessment Assessed: [Left: No] [Right: No] Edema: [Left: Ye] [Right: s] Vascular Assessment Pulses: Dorsalis Pedis Palpable: [Left:Yes] Electronic Signature(s) Signed: 02/27/2021 2:51:00 PM By: Carlene Coria RN Entered By: Carlene Coria on 02/27/2021 08:24:49 Chad [name] ([id number]) -------------------------------------------------------------------------------- Multi Wound Chart Details Patient Name: Chad [name] Date of Service: 02/27/2021 8:00 AM Medical Record Number: [id number] Patient Account Number: [id number] Date of Birth/Sex: 07-14-1961 (60 y.o. M) Treating RN: Carlene Coria Primary Care Atina Feeley: Clayborn Bigness Other Clinician: Referring Mady Oubre: Clayborn Bigness Treating Larraine Argo/Extender: Yaakov Guthrie in Treatment: 28 Vital Signs Height(in): 67 Pulse(bpm): 35 Weight(lbs): 160 Blood Pressure(mmHg):  149/81 Body Mass Index(BMI): 25 Temperature(F): 97.6 Respiratory Rate(breaths/min): 18 Photos: [N/A:N/A] Wound Location: Left, Medial Ankle N/A N/A Wounding Event: Gradually Appeared N/A N/A Primary Etiology: Venous Leg Ulcer N/A N/A Comorbid History: Anemia, Chronic Obstructive N/A N/A Pulmonary Disease (COPD), Hepatitis C, Received Chemotherapy Date Acquired: 07/12/2019 N/A N/A Weeks of Treatment: 28 N/A N/A Wound Status: Open N/A N/A Measurements L x W x D (cm) 2.3x1.4x0.1 N/A N/A Area (cm) : 2.529 N/A N/A Volume (cm) : 0.253 N/A N/A % Reduction in Area: 57.10% N/A  N/A % Reduction in Volume: 57.00% N/A N/A Classification: Full Thickness Without Exposed N/A N/A Support Structures Exudate Amount: Medium N/A N/A Exudate Type: Serosanguineous N/A N/A Exudate Color: red, brown N/A N/A Wound Margin: Flat and Intact N/A N/A Granulation Amount: Large (67-100%) N/A N/A Granulation Quality: Red, Pink N/A N/A Necrotic Amount: Small (1-33%) N/A N/A Exposed Structures: Fat Layer (Subcutaneous Tissue): N/A N/A Yes Fascia: No Tendon: No Muscle: No Joint: No Bone: No Epithelialization: Medium (34-66%) N/A N/A Treatment Notes Wound #8 (Ankle) Wound Laterality: Left, Medial Cleanser Normal Saline Discharge Instruction: Wash your hands with soap and water. Remove old dressing, discard into plastic bag and place into trash. Cleanse the wound with Normal Saline prior to applying a clean dressing using gauze sponges, not tissues or cotton balls. Do not Chad [name], Chad ([id number]) scrub or use excessive force. Pat dry using gauze sponges, not tissue or cotton balls. Peri-Wound Care Desitin Maximum Strength Ointment 4 (oz) Discharge Instruction: Zinc periwound Topical Primary Dressing Prisma 4.34 (in) Discharge Instruction: Moisten w/normal saline or sterile water; Cover wound as directed. Do not remove from wound bed. Secondary Dressing ABD Pad 5x9 (in/in) Discharge  Instruction: Cover with ABD pad Kerlix 4.5 x 4.1 (in/yd) Discharge Instruction: Apply Kerlix 4.5 x 4.1 (in/yd) as instructed Secured With Impact Surgical Tape, 2x2 (in/yd) Discharge Instruction: Secure kerlix Compression Wrap Compression Stockings Add-Ons Electronic Signature(s) Signed: 02/27/2021 9:00:08 AM By: Kalman Shan DO Entered By: Kalman Shan on 02/27/2021 08:51:57 Chad [name] ([id number]) -------------------------------------------------------------------------------- Multi-Disciplinary Care Plan Details Patient Name: Chad [name] Date of Service: 02/27/2021 8:00 AM Medical Record Number: [id number] Patient Account Number: [id number] Date of Birth/Sex: 03-11-1961 (60 y.o. M) Treating RN: Carlene Coria Primary Care Jonus Coble: Clayborn Bigness Other Clinician: Referring Shelina Luo: Clayborn Bigness Treating Alireza Pollack/Extender: Yaakov Guthrie in Treatment: 28 Active Inactive Wound/Skin Impairment Nursing Diagnoses: Knowledge deficit related to ulceration/compromised skin integrity Goals: Patient/caregiver will verbalize understanding of skin care regimen Date Initiated: 08/14/2020 Date Inactivated: 10/09/2020 Target Resolution Date: 09/14/2020 Goal Status: Met Ulcer/skin breakdown will have a volume reduction of 30% by week 4 Date Initiated: 08/14/2020 Date Inactivated: 10/19/2020 Target Resolution Date: 09/14/2020 Goal Status: Met Ulcer/skin breakdown will heal within 14 weeks Date Initiated: 12/07/2020 Target Resolution Date: 01/23/2021 Goal Status: Active Interventions: Assess patient/caregiver ability to obtain necessary supplies Assess patient/caregiver ability to perform ulcer/skin care regimen upon admission and as needed Assess ulceration(s) every visit Notes: Electronic Signature(s) Signed: 02/27/2021 2:51:00 PM By: Carlene Coria RN Entered By: Carlene Coria on 02/27/2021 08:40:11 Chad [name]  ([id number]) -------------------------------------------------------------------------------- Pain Assessment Details Patient Name: Chad [name] Date of Service: 02/27/2021 8:00 AM Medical Record Number: [id number] Patient Account Number: [id number] Date of Birth/Sex: October 01, 1960 (60 y.o. M) Treating RN: Carlene Coria Primary Care Amada Hallisey: Clayborn Bigness Other Clinician: Referring Noa Constante: Clayborn Bigness Treating Emaly Boschert/Extender: Yaakov Guthrie in Treatment: 28 Active Problems Location of Pain Severity and Description of Pain Patient Has Paino No Site Locations Pain Management and Medication Current Pain Management: Electronic Signature(s) Signed: 02/27/2021 2:51:00 PM By: Carlene Coria RN Entered By: Carlene Coria on 02/27/2021 08:21:12 Chad [name] ([id number]) -------------------------------------------------------------------------------- Patient/Caregiver Education Details Patient Name: Chad [name] Date of Service: 02/27/2021 8:00 AM Medical Record Number: [id number] Patient Account Number: [id number] Date of Birth/Gender: [birth year] (60 y.o. M) Treating RN: Carlene Coria Primary Care Physician: Clayborn Bigness Other Clinician: Referring Physician: Clayborn Bigness Treating Physician/Extender: Yaakov Guthrie in Treatment: 28 Education Assessment Education Provided To: Patient Education Topics Provided Wound/Skin Impairment: Methods: Explain/Verbal Responses: State content correctly Electronic  Signature(s) Signed: 02/27/2021 2:51:00 PM By: Carlene Coria RN Entered By: Carlene Coria on 02/27/2021 08:47:52 Chad [name] ([id number]) -------------------------------------------------------------------------------- Wound Assessment Details Patient Name: Chad [name] Date of Service: 02/27/2021 8:00 AM Medical Record Number: [id number] Patient Account Number: [id number] Date of Birth/Sex: 1961-04-08 (60 y.o. M) Treating RN: Carlene Coria Primary Care Chesky Heyer: Clayborn Bigness  Other Clinician: Referring Zidan Helget: Clayborn Bigness Treating Stoney Karczewski/Extender: Yaakov Guthrie in Treatment: 28 Wound Status Wound Number: 8 Primary Venous Leg Ulcer Etiology: Wound Location: Left, Medial Ankle Wound Open Wounding Event: Gradually Appeared Status: Date Acquired: 07/12/2019 Comorbid Anemia, Chronic Obstructive Pulmonary Disease (COPD), Weeks Of Treatment: 28 History: Hepatitis C, Received Chemotherapy Clustered Wound: No Photos Wound Measurements Length: (cm) 2.3 Width: (cm) 1.4 Depth: (cm) 0.1 Area: (cm) 2.529 Volume: (cm) 0.253 % Reduction in Area: 57.1% % Reduction in Volume: 57% Epithelialization: Medium (34-66%) Tunneling: No Undermining: No Wound Description Classification: Full Thickness Without Exposed Support Structures Wound Margin: Flat and Intact Exudate Amount: Medium Exudate Type: Serosanguineous Exudate Color: red, brown Foul Odor After Cleansing: No Slough/Fibrino Yes Wound Bed Granulation Amount: Large (67-100%) Exposed Structure Granulation Quality: Red, Pink Fascia Exposed: No Necrotic Amount: Small (1-33%) Fat Layer (Subcutaneous Tissue) Exposed: Yes Necrotic Quality: Adherent Slough Tendon Exposed: No Muscle Exposed: No Joint Exposed: No Bone Exposed: No Treatment Notes Wound #8 (Ankle) Wound Laterality: Left, Medial Cleanser Normal Saline Discharge Instruction: Wash your hands with soap and water. Remove old dressing, discard into plastic bag and place into trash. Cleanse the wound with Normal Saline prior to applying a clean dressing using gauze sponges, not tissues or cotton balls. Do not scrub or use excessive force. Pat dry using gauze sponges, not tissue or cotton balls. Chad [name], Chad [name] ([id number]) Peri-Wound Care Desitin Maximum Strength Ointment 4 (oz) Discharge Instruction: Zinc periwound Topical Primary Dressing Prisma 4.34 (in) Discharge Instruction: Moisten w/normal saline or sterile water; Cover  wound as directed. Do not remove from wound bed. Secondary Dressing ABD Pad 5x9 (in/in) Discharge Instruction: Cover with ABD pad Kerlix 4.5 x 4.1 (in/yd) Discharge Instruction: Apply Kerlix 4.5 x 4.1 (in/yd) as instructed Secured With [place] Surgical Tape, 2x2 (in/yd) Discharge Instruction: Secure kerlix Compression Wrap Compression Stockings Add-Ons Electronic Signature(s) Signed: 02/27/2021 2:51:00 PM By: Carlene Coria RN Entered By: Carlene Coria on 02/27/2021 08:24:30 Chad [name] ([id number]) -------------------------------------------------------------------------------- [place] Details Patient Name: Chad [name] Date of Service: 02/27/2021 8:00 AM Medical Record Number: [id number] Patient Account Number: [id number] Date of Birth/Sex: [birth year] (60 y.o. M) Treating RN: Carlene Coria Primary Care Tabari Volkert: Clayborn Bigness Other Clinician: Referring Kamarri Lovvorn: Clayborn Bigness Treating Alberta Cairns/Extender: Yaakov Guthrie in Treatment: 28 Vital Signs Time Taken: 08:20 Temperature (F): 97.6 Height (in): 67 Pulse (bpm): 79 Weight (lbs): 160 Respiratory Rate (breaths/min): 18 Body Mass Index (BMI): 25.1 Blood Pressure (mmHg): 149/81 Reference Range: 80 - 120 mg / dl Electronic Signature(s) Signed: 02/27/2021 2:51:00 PM By: Carlene Coria RN Entered By: Carlene Coria on 02/27/2021 08:21:06

## 2021-02-27 NOTE — Progress Notes (Signed)
Chad [name] ([id number]) Visit Report for 02/27/2021 Chief Complaint Document Details Patient Name: Chad [name] Date of Service: 02/27/2021 8:00 AM Medical Record Number: [id number] Patient Account Number: [id number] Date of Birth/Sex: 22-Nov-1960 (60 y.o. M) Treating RN: Carlene Coria Primary Care Provider: Clayborn Bigness Other Clinician: Referring Provider: Clayborn Bigness Treating Provider/Extender: Yaakov Guthrie in Treatment: 28 Information Obtained from: Patient Chief Complaint Left ankle and right foot and heel ulcers Electronic Signature(s) Signed: 02/27/2021 9:00:08 AM By: Kalman Shan DO Entered By: Kalman Shan on 02/27/2021 08:52:15 Chad [name] ([id number]) -------------------------------------------------------------------------------- HPI Details Patient Name: Chad [name] Date of Service: 02/27/2021 8:00 AM Medical Record Number: [id number] Patient Account Number: [id number] Date of Birth/Sex: Jul 25, 1961 (60 y.o. M) Treating RN: Carlene Coria Primary Care Provider: Clayborn Bigness Other Clinician: Referring Provider: Clayborn Bigness Treating Provider/Extender: Yaakov Guthrie in Treatment: 28 History of Present Illness HPI Description: 10/08/18 on evaluation today patient actually presents to our office for initial evaluation concerning wounds that he has of the bilateral lower extremities. 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 He also has bilateral venous stasis. He apparently has been homeless currently following discharge from the hospital apparently he has been placed at [facility] which is is a skilled nursing facility locally. Nonetheless fortunately he does not show any signs of infection at this time which is good news. In fact several of the wound actually appears to be showing some  signs of improvement already in my pinion. There are a couple areas in the left leg in particular there likely gonna require some sharp debridement to help clear away some necrotic tissue and help with more sufficient healing. No fevers, chills, nausea, or vomiting noted at this time. 10/15/18 on evaluation today patient actually appears to be doing very well in regard to his bilateral lower extremities. He's been tolerating the dressing changes without complication. Fortunately there does not appear to be any evidence of active infection at this time which is great news. Overall I'm actually very pleased with how this has progressed in just one visits time. Readmission: 08/14/2020 upon evaluation today patient presents for re-evaluation here in our clinic. He is having issues with his left ankle region as well as his right toe and his right heel. He tells me that the toe and heel actually began as a area that was itching that he was scratching and then subsequently opened up into wounds. These may have been abscess areas I presume based on what I am seeing currently. With regard to his left ankle region he tells me this was a similar type occurrence although he does have venous stasis this very well may be more of a venous leg ulcer more than anything. Nonetheless I do believe that the patient would benefit from appropriate and aggressive wound care to try to help get things under better control here. He does have history of a stroke on the left side affecting him to some degree there that he is able to stand although he does have some residual weakness. Otherwise again the patient does have chronic venous insufficiency as previously noted. His arterial studies most recently obtained showed that he had an ABI on the right of 1.16 with a TBI of 0.52 and on the left and ABI of 1.14 with a TBI of 0.81. That was obtained on 06/19/2020. 08/28/2020 upon evaluation today patient appears  to be doing decently  well in regard to his wounds in general. He has been tolerating the dressing changes without complication. Fortunately there does not appear to be any signs of active infection which is great news. With that being said I think the [facility] is doing a good job I would recommend that we likely continue with that currently. 09/11/2020 upon evaluation today patient's wounds did not appear to be doing too poorly but again he is not really showing signs of significant improvement with regard to any of the wounds on the right. None of them have Hydrofera Blue on them I am not exactly sure why this is not being followed as the facility did not contact us to let us know of any issues with obtaining dressings or otherwise. With that being said he is supposed to be using Hydrofera Blue on both of the wounds on the right foot as well as the ankle wound on the left side. 09/18/2020 upon evaluation today patient appears to be doing poorly with regard to his wounds. Again right now the left ankle in particular showed signs of extreme maceration. Apparently he was told by someone with staff at [facility] they could not get the [facility]. With that being said this is something that is never been relayed to [place] one way or another. Also the patient subsequently has not supposed to have a border gauze dressing on. He should have an ABD pad and roll gauze to secure as this drains much too much just to have a border gauze dressing to cover. Nonetheless the fact that they are not using the appropriate dressing is directly causing deterioration of the left ankle wound it is significantly worse today compared to what it was previous. I did attempt to call [facility] while the patient was here I called three times and got no one to even pick up the phone. After this I had my for an office coordinator call and she was able to finally get through and leave a message with the D ON as of dictation of this  note which is roughly about an hour and a half later I still have not been able to speak with anyone at the facility. 09/25/2020 upon evaluation today patient actually showing signs of good improvement which is excellent news. He has been tolerating the dressing changes without complication. Fortunately there is no signs of active infection which is great news. No fevers, chills, nausea, vomiting, or diarrhea. I do feel like the facility has been doing a much better job at taking care of him as far as the dressings are concerned. However the director of nursing never did call me back. 10/09/2020 upon evaluation today patient appears to be doing well with regard to his wound. The toe ulcer did require some debridement but the other 2 areas actually appear to be doing quite well. 10/19/2020 upon evaluation today patient actually appears to be doing very well in regard to his wounds. In fact the heel does appear to be completely healed. The toe is doing better in the medial ankle on the left is also doing better. Overall I think he is headed in the right direction. 10/26/2020 upon evaluation today patient appears to be doing well with regard to his wound. He is showing signs of improvement which is great news and overall I am very pleased with where things stand today. No fevers, chills, nausea, vomiting, or diarrhea. 11/02/2020 upon evaluation today patient appears to be doing  well with regard to his wounds. He has been tolerating the dressing changes without complication overall I am extremely pleased with where things stand today. He in regard to the toe is almost completely healed and the medial ankle on the left is doing much better. 11/09/2020 upon evaluation today patient appears to be doing a little poorly in regard to his left medial ankle ulcer. Fortunately there does not appear to be any signs of systemic infection but unfortunately locally he does appear to be infected in fact he has blue-green  drainage consistent with Pseudomonas. Chad, [name] ([id number]) 11/16/2020 upon evaluation today patient appears to be doing well with regard to his wound. It actually appears to be doing better. I did place him on gentamicin cream since the Cipro was actually resistant even though he was positive for Pseudomonas on culture. Overall I think that he does seem to be doing better though I am unsure whether or not they have actually been putting the cream on. The patient is not sure that we did talk to the nurse directly and she was going to initiate that treatment. Fortunately there does not appear to be any signs of active infection at this time. No fevers, chills, nausea, vomiting, or diarrhea. 4/28; the area on the right second toe is close to healed. Left medial ankle required debridement 12/07/2020 upon evaluation today patient appears to be doing well with regard to his wounds. In fact the right second toe appears to be completely healed which is great news. Fortunately there does not appear to be any signs of active infection at this time which is also great news. I think we can probably discontinue the gentamicin on top of everything else. 12/14/2020 upon evaluation today patient appears to be doing well with regard to his wound. He is making good progress and overall very pleased with where things stand today. There is no signs of active infection at this time which is great news. 12/28/2020 upon evaluation today patient appears to be doing well with regard to his wounds. He has been tolerating the dressing changes without complication. Fortunately there is no signs of active infection at this time. No fevers, chills, nausea, vomiting, or diarrhea. 12/28/2020 upon evaluation today patient's wound bed actually showed signs of excellent improvement. He has great epithelization and granulation I do not see any signs of infection overall I am extremely pleased with where things stand at this point. No  fevers, chills, nausea, vomiting, or diarrhea. 01/11/2021 upon evaluation today patient appears to be doing well with regard to his wound on his leg. He has been tolerating the dressing changes without complication. Fortunately there does not appear to be any signs of active infection which is great news. No fevers, chills, nausea, vomiting, or diarrhea. 01/25/2021 upon evaluation today patient appears to be doing well with regard to his wound. He has been tolerating the dressing changes without complication. Fortunately the collagen seems to be doing a great job which is excellent news. No fevers, chills, nausea, vomiting, or diarrhea. 02/08/2021 upon evaluation today patient's wound is actually looking a little bit worse especially in the periwound compared to previous. Fortunately there does not appear to be any signs of infection which is great news with that being said he does have some irritation around the periphery of the wound which has me more concerned. He actually had a dressing on that had not been changed in 3 days. He also is supposed to have daily dressing changes. With regard  to the dressing applied he had a silver alginate dressing and silver collagen is what is recommended and ordered. He also had no Desitin around the edges of the wound in the periwound region although that is on the order inspect to be done as well. In general I was very concerned I did contact [facility] actually spoke with Magda Paganini who is the scheduling individual and subsequently she stated that she would pass the information to the D ON apparently the D ON was not available to talk to me when I call today. 02/18/2021 upon evaluation today patient's wound is actually showing signs of improvement. Fortunately there does not appear to be any evidence of infection which is great news overall I am extremely pleased with where things stand today. No fevers, chills, nausea, vomiting, or diarrhea. 8/3; patient  presents for 1 week follow-up. He has no issues or complaints today. He denies signs of infection. Electronic Signature(s) Signed: 02/27/2021 9:00:08 AM By: Kalman Shan DO Entered By: Kalman Shan on 02/27/2021 08:53:00 Chad [name] ([id number]) -------------------------------------------------------------------------------- Physical Exam Details Patient Name: Chad [name] Date of Service: 02/27/2021 8:00 AM Medical Record Number: [id number] Patient Account Number: [id number] Date of Birth/Sex: 30-Sep-1960 (60 y.o. M) Treating RN: Carlene Coria Primary Care Provider: Clayborn Bigness Other Clinician: Referring Provider: Clayborn Bigness Treating Provider/Extender: Yaakov Guthrie in Treatment: 28 Constitutional . Cardiovascular . Psychiatric . Notes Left ankle: To the medial aspect there is an open wound with granulation tissue present. Electronic Signature(s) Signed: 02/27/2021 9:00:08 AM By: Kalman Shan DO Entered By: Kalman Shan on 02/27/2021 08:55:24 Chad [name] ([id number]) -------------------------------------------------------------------------------- Physician Orders Details Patient Name: Chad [name] Date of Service: 02/27/2021 8:00 AM Medical Record Number: [id number] Patient Account Number: [id number] Date of Birth/Sex: 28-Jan-1961 (60 y.o. M) Treating RN: Carlene Coria Primary Care Provider: Clayborn Bigness Other Clinician: Referring Provider: Clayborn Bigness Treating Provider/Extender: Yaakov Guthrie in Treatment: 67 Verbal / Phone Orders: No Diagnosis Coding Follow-up Appointments o Return Appointment in 1 week. Bathing/ Shower/ Hygiene o May shower; gently cleanse wound with antibacterial soap, rinse and pat dry prior to dressing wounds Wound Treatment Wound #8 - Ankle Wound Laterality: Left, Medial Cleanser: Normal Saline 1 x Per Day/30 Days Discharge Instructions: Wash your hands with soap and water. Remove old dressing, discard into  plastic bag and place into trash. Cleanse the wound with Normal Saline prior to applying a clean dressing using gauze sponges, not tissues or cotton balls. Do not scrub or use excessive force. Pat dry using gauze sponges, not tissue or cotton balls. Peri-Wound Care: Desitin Maximum Strength Ointment 4 (oz) 1 x Per Day/30 Days Discharge Instructions: Zinc periwound Primary Dressing: Prisma 4.34 (in) 1 x Per Day/30 Days Discharge Instructions: Moisten w/normal saline or sterile water; Cover wound as directed. Do not remove from wound bed. Secondary Dressing: ABD Pad 5x9 (in/in) 1 x Per Day/30 Days Discharge Instructions: Cover with ABD pad Secondary Dressing: Kerlix 4.5 x 4.1 (in/yd) 1 x Per Day/30 Days Discharge Instructions: Apply Kerlix 4.5 x 4.1 (in/yd) as instructed Secured With: 32M Medipore H Soft Cloth Surgical Tape, 2x2 (in/yd) 1 x Per Day/30 Days Discharge Instructions: Secure kerlix Electronic Signature(s) Signed: 02/27/2021 9:00:08 AM By: Kalman Shan DO Signed: 02/27/2021 2:51:00 PM By: Carlene Coria RN Entered By: Carlene Coria on 02/27/2021 08:44:50 Chad [name] ([id number]) -------------------------------------------------------------------------------- Problem List Details Patient Name: Chad [name] Date of Service: 02/27/2021 8:00 AM Medical Record Number: [id number] Patient Account Number: [id number] Date of Birth/Sex: [birth year] (60 y.o. 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 08/14/2020 No Yes exposed L97.512 Non-pressure  chronic ulcer of other part of right foot with fat layer 08/14/2020 No Yes exposed 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 M) Treating RN: Carlene Coria Primary Care Provider: Clayborn Bigness Other Clinician: Referring Provider: Clayborn Bigness Treating Provider/Extender: Yaakov Guthrie in Treatment: 28 Subjective Chief Complaint Information obtained from Patient Left ankle and right foot and heel ulcers History of Present Illness (HPI) 10/08/18 on evaluation today patient actually presents to our office for initial evaluation concerning wounds that he has of the bilateral lower extremities. 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 He also has bilateral venous stasis. He apparently has been homeless currently following discharge from the hospital apparently he has been placed at [facility] which is is a skilled nursing facility locally. Nonetheless fortunately he does not show any signs of infection at this time which is good news. In fact several of the wound actually appears to be showing some signs of improvement already in my pinion. There are a couple areas in the left leg in particular there likely gonna require some sharp debridement to help clear away some necrotic tissue and help with  more sufficient healing. No fevers, chills, nausea, or vomiting noted at this time. 10/15/18 on evaluation today patient actually appears to be doing very well in regard to his bilateral lower extremities. He's been tolerating the dressing changes without complication. Fortunately there does not appear to be any evidence of active infection at this time which is great news. Overall I'm actually very pleased with how this has progressed in just one visits time. Readmission: 08/14/2020 upon evaluation today patient presents for re-evaluation here in our clinic. He is having issues with his left ankle region as well as his right toe and his right heel. He tells me that the toe and heel actually began as a area that was itching that he was scratching and then subsequently opened up into wounds. These may have been abscess areas I presume based on what I am seeing currently. With regard to his left ankle region he tells me this was a similar type occurrence although he does have venous stasis this very well may be more of a venous leg ulcer more than anything. Nonetheless I do believe that the patient would benefit from appropriate and aggressive wound care to try to help get things under better control here. He does have history of a stroke on the left side affecting him to some degree there that he is able to stand although he does have some residual weakness. Otherwise again the patient does have chronic venous insufficiency as previously noted.  His arterial studies most recently obtained showed that he had an ABI on the right of 1.16 with a TBI of 0.52 and on the left and ABI of 1.14 with a TBI of 0.81. That was obtained on 06/19/2020. 08/28/2020 upon evaluation today patient appears to be doing decently well in regard to his wounds in general. He has been tolerating the dressing changes without complication. Fortunately there does not appear to be any signs of active infection which is great news. With  that being said I think the [facility] is doing a good job I would recommend that we likely continue with that currently. 09/11/2020 upon evaluation today patient's wounds did not appear to be doing too poorly but again he is not really showing signs of significant improvement with regard to any of the wounds on the right. None of them have Hydrofera Blue on them I am not exactly sure why this is not being followed as the facility did not contact us to let us know of any issues with obtaining dressings or otherwise. With that being said he is supposed to be using Hydrofera Blue on both of the wounds on the right foot as well as the ankle wound on the left side. 09/18/2020 upon evaluation today patient appears to be doing poorly with regard to his wounds. Again right now the left ankle in particular showed signs of extreme maceration. Apparently he was told by someone with staff at [facility] they could not get the [facility]. With that being said this is something that is never been relayed to [place] one way or another. Also the patient subsequently has not supposed to have a border gauze dressing on. He should have an ABD pad and roll gauze to secure as this drains much too much just to have a border gauze dressing to cover. Nonetheless the fact that they are not using the appropriate dressing is directly causing deterioration of the left ankle wound it is significantly worse today compared to what it was previous. I did attempt to call [facility] while the patient was here I called three times and got no one to even pick up the phone. After this I had my for an office coordinator call and she was able to finally get through and leave a message with the D ON as of dictation of this note which is roughly about an hour and a half later I still have not been able to speak with anyone at the facility. 09/25/2020 upon evaluation today patient actually showing signs of good improvement  which is excellent news. He has been tolerating the dressing changes without complication. Fortunately there is no signs of active infection which is great news. No fevers, chills, nausea, vomiting, or diarrhea. I do feel like the facility has been doing a much better job at taking care of him as far as the dressings are concerned. However the director of nursing never did call me back. 10/09/2020 upon evaluation today patient appears to be doing well with regard to his wound. The toe ulcer did require some debridement but the other 2 areas actually appear to be doing quite well. 10/19/2020 upon evaluation today patient actually appears to be doing very well in regard to his wounds. In fact the heel does appear to be completely healed. The toe is doing better in the medial ankle on the left is also doing better. Overall I think he is headed in the right direction. 10/26/2020 upon evaluation today patient appears  to be doing well with regard to his wound. He is showing signs of improvement which is great news and overall I am very pleased with where things stand today. No fevers, chills, nausea, vomiting, or diarrhea. 11/02/2020 upon evaluation today patient appears to be doing well with regard to his wounds. He has been tolerating the dressing changes without complication overall I am extremely pleased with where things stand today. He in regard to the toe is almost completely healed and the medial [facility] ([id number]) ankle on the left is doing much better. 11/09/2020 upon evaluation today patient appears to be doing a little poorly in regard to his left medial ankle ulcer. Fortunately there does not appear to be any signs of systemic infection but unfortunately locally he does appear to be infected in fact he has blue-green drainage consistent with Pseudomonas. 11/16/2020 upon evaluation today patient appears to be doing well with regard to his wound. It actually appears to be doing better. I did  place him on gentamicin cream since the Cipro was actually resistant even though he was positive for Pseudomonas on culture. Overall I think that he does seem to be doing better though I am unsure whether or not they have actually been putting the cream on. The patient is not sure that we did talk to the nurse directly and she was going to initiate that treatment. Fortunately there does not appear to be any signs of active infection at this time. No fevers, chills, nausea, vomiting, or diarrhea. 4/28; the area on the right second toe is close to healed. Left medial ankle required debridement 12/07/2020 upon evaluation today patient appears to be doing well with regard to his wounds. In fact the right second toe appears to be completely healed which is great news. Fortunately there does not appear to be any signs of active infection at this time which is also great news. I think we can probably discontinue the gentamicin on top of everything else. 12/14/2020 upon evaluation today patient appears to be doing well with regard to his wound. He is making good progress and overall very pleased with where things stand today. There is no signs of active infection at this time which is great news. 12/28/2020 upon evaluation today patient appears to be doing well with regard to his wounds. He has been tolerating the dressing changes without complication. Fortunately there is no signs of active infection at this time. No fevers, chills, nausea, vomiting, or diarrhea. 12/28/2020 upon evaluation today patient's wound bed actually showed signs of excellent improvement. He has great epithelization and granulation I do not see any signs of infection overall I am extremely pleased with where things stand at this point. No fevers, chills, nausea, vomiting, or diarrhea. 01/11/2021 upon evaluation today patient appears to be doing well with regard to his wound on his leg. He has been tolerating the dressing changes without  complication. Fortunately there does not appear to be any signs of active infection which is great news. No fevers, chills, nausea, vomiting, or diarrhea. 01/25/2021 upon evaluation today patient appears to be doing well with regard to his wound. He has been tolerating the dressing changes without complication. Fortunately the collagen seems to be doing a great job which is excellent news. No fevers, chills, nausea, vomiting, or diarrhea. 02/08/2021 upon evaluation today patient's wound is actually looking a little bit worse especially in the periwound compared to previous. Fortunately there does not appear to be any signs of infection which is great  news with that being said he does have some irritation around the periphery of the wound which has me more concerned. He actually had a dressing on that had not been changed in 3 days. He also is supposed to have daily dressing changes. With regard to the dressing applied he had a silver alginate dressing and silver collagen is what is recommended and ordered. He also had no Desitin around the edges of the wound in the periwound region although that is on the order inspect to be done as well. In general I was very concerned I did contact [facility] actually spoke with Magda Paganini who is the scheduling individual and subsequently she stated that she would pass the information to the D ON apparently the D ON was not available to talk to me when I call today. 02/18/2021 upon evaluation today patient's wound is actually showing signs of improvement. Fortunately there does not appear to be any evidence of infection which is great news overall I am extremely pleased with where things stand today. No fevers, chills, nausea, vomiting, or diarrhea. 8/3; patient presents for 1 week follow-up. He has no issues or complaints today. He denies signs of infection. Patient History Information obtained from Patient. Social History Current every day smoker - 1/2 pack per  day, Marital Status - Single, Alcohol Use - Never, Drug Use - No History, Caffeine Use - Daily - cooffee. Medical History Eyes Denies history of Cataracts, Glaucoma, Optic Neuritis Ear/Nose/Mouth/Throat Denies history of Chronic sinus problems/congestion, Middle ear problems Hematologic/Lymphatic Patient has history of Anemia Denies history of Hemophilia, Human Immunodeficiency Virus, Lymphedema, Sickle Cell Disease Respiratory Patient has history of Chronic Obstructive Pulmonary Disease (COPD) Denies history of Aspiration, Asthma, Pneumothorax, Sleep Apnea, Tuberculosis Cardiovascular Denies history of Angina, Arrhythmia, Congestive Heart Failure, Coronary Artery Disease, Deep Vein Thrombosis, Hypertension, Hypotension, Myocardial Infarction, Peripheral Arterial Disease, Peripheral Venous Disease, Phlebitis, Vasculitis Gastrointestinal Patient has history of Hepatitis C Denies history of Cirrhosis , Colitis, Crohn s, Hepatitis A, Hepatitis B Endocrine Denies history of Type I Diabetes, Type II Diabetes Genitourinary Denies history of End Stage Renal Disease Immunological Denies history of Lupus Erythematosus, Raynaud s, Scleroderma Integumentary (Skin) Denies history of History of Burn, History of pressure wounds Musculoskeletal Chad, [name] ([id number]) Denies history of Gout, Rheumatoid Arthritis, Osteoarthritis, Osteomyelitis Neurologic Denies history of Dementia, Neuropathy, Quadriplegia, Paraplegia, Seizure Disorder Oncologic Patient has history of Received Chemotherapy - infusions Objective Constitutional Vitals Time Taken: 8:20 AM, Height: 67 in, Weight: 160 lbs, BMI: 25.1, Temperature: 97.6 F, Pulse: 79 bpm, Respiratory Rate: 18 breaths/min, Blood Pressure: 149/81 mmHg. General Notes: Left ankle: To the medial aspect there is an open wound with granulation tissue present. Integumentary (Hair, Skin) Wound #8 status is Open. Original cause of wound was Gradually  Appeared. The date acquired was: 07/12/2019. The wound has been in treatment 28 weeks. The wound is located on the Left,Medial Ankle. The wound measures 2.3cm length x 1.4cm width x 0.1cm depth; 2.529cm^2 area and 0.253cm^3 volume. There is Fat Layer (Subcutaneous Tissue) exposed. There is no tunneling or undermining noted. There is a medium amount of serosanguineous drainage noted. The wound margin is flat and intact. There is large (67-100%) red, pink granulation within the wound bed. There is a small (1-33%) amount of necrotic tissue within the wound bed including Adherent Slough. 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 No signs of infection on exam. I recommended continuing with silver collagen. Follow-up in 1 week with [facility] Plan Follow-up Appointments: Return Appointment in 1 week. Bathing/ Shower/ Hygiene: May shower; gently cleanse wound with antibacterial soap, rinse and pat dry prior to dressing wounds WOUND #8: - Ankle Wound Laterality: Left, Medial Cleanser: Normal Saline 1 x Per Day/30 Days Discharge Instructions: Wash your hands with soap and water. Remove old dressing, discard into plastic bag and place into trash. Cleanse the wound with Normal Saline prior to applying a clean dressing using gauze sponges, not tissues or cotton balls. Do not scrub or use excessive force. Pat dry using gauze sponges, not tissue or cotton balls. Peri-Wound Care: Desitin Maximum Strength Ointment 4 (oz) 1 x Per Day/30 Days Discharge Instructions: Zinc periwound Primary Dressing: Prisma 4.34 (in) 1 x Per Day/30 Days Discharge  Instructions: Moisten w/normal saline or sterile water; Cover wound as directed. Do not remove from wound bed. Secondary Dressing: ABD Pad 5x9 (in/in) 1 x Per Day/30 Days Discharge Instructions: Cover with ABD pad Secondary Dressing: Kerlix 4.5 x 4.1 (in/yd) 1 x Per Day/30 Days Discharge Instructions: Apply Kerlix 4.5 x 4.1 (in/yd) as instructed Secured With: 74M Medipore H Soft Cloth Surgical Tape, 2x2 (in/yd) 1 x Per Day/30 Days Discharge Instructions: Secure Chad, [name] ([id number]) 1. Silver collagen 2. Follow-up in 1 week Electronic Signature(s) Signed: 02/27/2021 9:00:08 AM By: Kalman Shan DO Entered By: Kalman Shan on 02/27/2021 08:55:35 Chad [name] ([id number]) -------------------------------------------------------------------------------- ROS/PFSH Details Patient Name: Chad [name] Date of Service: 02/27/2021 8:00 AM Medical Record Number: [id number] Patient Account Number: [id number] Date of Birth/Sex: [birth year] (60 y.o. M) Treating RN: Carlene Coria Primary Care Provider: Clayborn Bigness Other Clinician: Referring Provider: Clayborn Bigness Treating Provider/Extender: Yaakov Guthrie in Treatment: 28 Information Obtained From Patient Eyes Medical History: Negative for: Cataracts; Glaucoma; Optic Neuritis Ear/Nose/Mouth/Throat Medical History: Negative for: Chronic sinus problems/congestion; Middle ear problems Hematologic/Lymphatic Medical History: Positive for: Anemia Negative for: Hemophilia; Human Immunodeficiency Virus; Lymphedema; Sickle Cell Disease Respiratory Medical History: Positive for: Chronic Obstructive Pulmonary Disease (COPD) Negative for: Aspiration; Asthma; Pneumothorax; Sleep Apnea; Tuberculosis Cardiovascular Medical History: Negative for: Angina; Arrhythmia; Congestive Heart Failure; Coronary Artery Disease; Deep Vein Thrombosis; Hypertension; Hypotension; Myocardial Infarction; Peripheral Arterial Disease; Peripheral  Venous Disease; Phlebitis; Vasculitis Gastrointestinal Medical History: Positive for: Hepatitis C Negative for: Cirrhosis ; Colitis; Crohnos; Hepatitis A; Hepatitis B Endocrine Medical History: Negative for: Type I Diabetes; Type II Diabetes Genitourinary Medical History: Negative for: End Stage Renal Disease Immunological Medical History: Negative for: Lupus Erythematosus; Raynaudos; Scleroderma Integumentary (Skin) Medical History: Negative for: History of Burn; History of pressure wounds Musculoskeletal Chad, [name] ([id number]) Medical History: Negative for: Gout; Rheumatoid Arthritis; Osteoarthritis; Osteomyelitis Neurologic Medical History: Negative for: Dementia; Neuropathy; Quadriplegia; Paraplegia; Seizure Disorder Oncologic Medical History: Positive for: Received Chemotherapy - infusions Immunizations Pneumococcal Vaccine: Received Pneumococcal Vaccination: Yes Received Pneumococcal Vaccination On or After 60th Birthday: No Implantable Devices None Family and Social History Current every day smoker - 1/2 pack per day; Marital Status - Single; Alcohol Use: Never; Drug Use: No History; Caffeine Use: Daily - cooffee; Financial Concerns: No; Food, Clothing or Shelter Needs: No; Support System Lacking: No; Transportation Concerns: No Electronic Signature(s) Signed: 02/27/2021 9:00:08 AM By: Kalman Shan DO Signed: 02/27/2021 2:51:00 PM By: Carlene Coria RN Entered By: Kalman Shan on 02/27/2021 08:53:19 Chad [name],  Chad [name] ([id number]) -------------------------------------------------------------------------------- SuperBill Details Patient Name: Chad, [name] Date of Service: 02/27/2021 Medical Record Number: [id number] Patient Account Number: [id number] Date of Birth/Sex: 16-May-1961 (60 y.o. M) Treating RN: Carlene Coria Primary Care Provider: Clayborn Bigness Other Clinician: Referring Provider: Clayborn Bigness Treating Provider/Extender: Yaakov Guthrie in  Treatment: 28 Diagnosis Coding ICD-10 Codes Code Description I87.2 Venous insufficiency (chronic) (peripheral) L97.322 Non-pressure chronic ulcer of left ankle with fat layer exposed L97.412 Non-pressure chronic ulcer of right heel and midfoot with fat layer exposed L97.512 Non-pressure chronic ulcer of other part of right foot with fat layer exposed 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 side Facility Procedures CPT4 Code: [id number] Description: 99213 - WOUND CARE VISIT-LEV 3 EST PT Modifier: Quantity: 1 Physician Procedures CPT4 Code: [id number] Description: 99213 - WC PHYS LEVEL 3 - EST PT Modifier: Quantity: 1 CPT4 Code: Description: ICD-10 Diagnosis Description L97.322 Non-pressure chronic ulcer of left ankle with fat layer exposed I87.2 Venous insufficiency (chronic) (peripheral) I69.354 Hemiplegia and hemiparesis following cerebral infarction affecting left Modifier: non-dominant side Quantity: Electronic Signature(s) Signed: 02/27/2021 9:00:08 AM By: Kalman Shan DO Previous Signature: 02/27/2021 8:47:41 AM Version By: Carlene Coria RN Entered By: Kalman Shan on 02/27/2021 08:55:59

## 2021-03-07 ENCOUNTER — Ambulatory Visit: Payer: Medicaid Other | Admitting: Physician Assistant

## 2021-03-11 ENCOUNTER — Other Ambulatory Visit: Payer: Self-pay

## 2021-03-11 ENCOUNTER — Encounter: Payer: Medicaid Other | Admitting: Physician Assistant

## 2021-03-11 DIAGNOSIS — L97512 Non-pressure chronic ulcer of other part of right foot with fat layer exposed: Secondary | ICD-10-CM | POA: Diagnosis not present

## 2021-03-11 NOTE — Progress Notes (Addendum)
ADRIK, PANNO ([id number]) Visit Report for 03/11/2021 Chief Complaint Document Details Patient Name: Chad [name], Chad [name] Date of Service: 03/11/2021 11:00 AM Medical Record Number: [id number] Patient Account Number: [id number] Date of Birth/Sex: [birth year] (60 y.o. M) Treating RN: Donnamarie Poag Primary Care Provider: Clayborn Bigness Other Clinician: Referring Provider: Clayborn Bigness Treating Provider/Extender: Skipper Cliche in Treatment: 29 Information Obtained from: Patient Chief Complaint Left ankle and right foot and heel ulcers Electronic Signature(s) Signed: 03/11/2021 11:07:35 AM By: Worthy Keeler PA-C Entered By: Worthy Keeler on 03/11/2021 11:07:35 Chad [name] ([id number]) -------------------------------------------------------------------------------- Debridement Details Patient Name: Chad [name] Date of Service: 03/11/2021 11:00 AM Medical Record Number: [id number] Patient Account Number: [id number] Date of Birth/Sex: 11/05/1960 (60 y.o. M) Treating RN: Donnamarie Poag Primary Care Provider: Clayborn Bigness Other Clinician: Referring Provider: Clayborn Bigness Treating Provider/Extender: Skipper Cliche in Treatment: 29 Debridement Performed for Wound #8 Left,Medial Ankle Assessment: Performed By: Physician Tommie Sams., PA-C Debridement Type: Debridement Severity of Tissue Pre Debridement: Fat layer exposed Level of Consciousness (Pre- Awake and Alert procedure): Pre-procedure Verification/Time Out Yes - 11:27 Taken: Start Time: 11:27 Pain Control: Lidocaine Total Area Debrided (L x W): 2.1 (cm) x 1.1 (cm) = 2.31 (cm) Tissue and other material Viable, Non-Viable, Slough, Subcutaneous, Biofilm, Slough debrided: Level: Skin/Subcutaneous Tissue Debridement Description: Excisional Instrument: Curette Bleeding: Minimum Hemostasis Achieved: Pressure End Time: 11:28 Response to Treatment: Procedure was tolerated well Level of Consciousness (Post- Awake and  Alert procedure): Post Debridement Measurements of Total Wound Length: (cm) 2.1 Width: (cm) 1.1 Depth: (cm) 0.1 Volume: (cm) 0.181 Character of Wound/Ulcer Post Debridement: Improved Severity of Tissue Post Debridement: Fat layer exposed Post Procedure Diagnosis Same as Pre-procedure Electronic Signature(s) Signed: 03/11/2021 11:50:15 AM By: Donnamarie Poag Signed: 03/11/2021 4:23:01 PM By: Worthy Keeler PA-C Entered By: Donnamarie Poag on 03/11/2021 11:28:55 Chad [name] ([id number]) -------------------------------------------------------------------------------- HPI Details Patient Name: Chad [name] Date of Service: 03/11/2021 11:00 AM Medical Record Number: [id number] Patient Account Number: [id number] Date of Birth/Sex: 1960/09/14 (60 y.o. M) Treating RN: Donnamarie Poag Primary Care Provider: Clayborn Bigness Other Clinician: Referring Provider: Clayborn Bigness Treating Provider/Extender: Skipper Cliche in Treatment: 29 History of Present Illness HPI Description: 10/08/18 on evaluation today patient actually presents to our office for initial evaluation concerning wounds that he has of the bilateral lower extremities. 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 He also has bilateral venous stasis. He apparently has been homeless currently following discharge from the hospital apparently he has been placed at [facility] which is is a skilled nursing facility locally. Nonetheless fortunately he does not show any signs of infection at this time which is good news. In fact several of the wound actually appears to be showing some signs of improvement already in my pinion. There are a couple areas in the left leg in particular there likely gonna require some sharp debridement to help clear away some necrotic tissue and help with more  sufficient healing. No fevers, chills, nausea, or vomiting noted at this time. 10/15/18 on evaluation today patient actually appears to be doing very well in regard to his bilateral lower extremities. He's been tolerating the dressing changes without complication. Fortunately there does not appear to be any evidence of active infection at this time which is great news. Overall I'm actually very pleased with how this has progressed in just one visits time.  Readmission: 08/14/2020 upon evaluation today patient presents for re-evaluation here in our clinic. He is having issues with his left ankle region as well as his right toe and his right heel. He tells me that the toe and heel actually began as a area that was itching that he was scratching and then subsequently opened up into wounds. These may have been abscess areas I presume based on what I am seeing currently. With regard to his left ankle region he tells me this was a similar type occurrence although he does have venous stasis this very well may be more of a venous leg ulcer more than anything. Nonetheless I do believe that the patient would benefit from appropriate and aggressive wound care to try to help get things under better control here. He does have history of a stroke on the left side affecting him to some degree there that he is able to stand although he does have some residual weakness. Otherwise again the patient does have chronic venous insufficiency as previously noted. His arterial studies most recently obtained showed that he had an ABI on the right of 1.16 with a TBI of 0.52 and on the left and ABI of 1.14 with a TBI of 0.81. That was obtained on 06/19/2020. 08/28/2020 upon evaluation today patient appears to be doing decently well in regard to his wounds in general. He has been tolerating the dressing changes without complication. Fortunately there does not appear to be any signs of active infection which is great news. With that  being said I think the [facility] is doing a good job I would recommend that we likely continue with that currently. 09/11/2020 upon evaluation today patient's wounds did not appear to be doing too poorly but again he is not really showing signs of significant improvement with regard to any of the wounds on the right. None of them have Hydrofera Blue on them I am not exactly sure why this is not being followed as the facility did not contact us to let us know of any issues with obtaining dressings or otherwise. With that being said he is supposed to be using Hydrofera Blue on both of the wounds on the right foot as well as the ankle wound on the left side. 09/18/2020 upon evaluation today patient appears to be doing poorly with regard to his wounds. Again right now the left ankle in particular showed signs of extreme maceration. Apparently he was told by someone with staff at [facility] they could not get the [facility]. With that being said this is something that is never been relayed to [place] one way or another. Also the patient subsequently has not supposed to have a border gauze dressing on. He should have an ABD pad and roll gauze to secure as this drains much too much just to have a border gauze dressing to cover. Nonetheless the fact that they are not using the appropriate dressing is directly causing deterioration of the left ankle wound it is significantly worse today compared to what it was previous. I did attempt to call [facility] while the patient was here I called three times and got no one to even pick up the phone. After this I had my for an office coordinator call and she was able to finally get through and leave a message with the D ON as of dictation of this note which is roughly about an hour and a half later I still have not been able to speak  with anyone at the facility. 09/25/2020 upon evaluation today patient actually showing signs of good improvement which  is excellent news. He has been tolerating the dressing changes without complication. Fortunately there is no signs of active infection which is great news. No fevers, chills, nausea, vomiting, or diarrhea. I do feel like the facility has been doing a much better job at taking care of him as far as the dressings are concerned. However the director of nursing never did call me back. 10/09/2020 upon evaluation today patient appears to be doing well with regard to his wound. The toe ulcer did require some debridement but the other 2 areas actually appear to be doing quite well. 10/19/2020 upon evaluation today patient actually appears to be doing very well in regard to his wounds. In fact the heel does appear to be completely healed. The toe is doing better in the medial ankle on the left is also doing better. Overall I think he is headed in the right direction. 10/26/2020 upon evaluation today patient appears to be doing well with regard to his wound. He is showing signs of improvement which is great news and overall I am very pleased with where things stand today. No fevers, chills, nausea, vomiting, or diarrhea. 11/02/2020 upon evaluation today patient appears to be doing well with regard to his wounds. He has been tolerating the dressing changes without complication overall I am extremely pleased with where things stand today. He in regard to the toe is almost completely healed and the medial ankle on the left is doing much better. 11/09/2020 upon evaluation today patient appears to be doing a little poorly in regard to his left medial ankle ulcer. Fortunately there does not appear to be any signs of systemic infection but unfortunately locally he does appear to be infected in fact he has blue-green drainage consistent with Pseudomonas. Chad [name], Chad [name] ([id number]) 11/16/2020 upon evaluation today patient appears to be doing well with regard to his wound. It actually appears to be doing better. I did place  him on gentamicin cream since the Cipro was actually resistant even though he was positive for Pseudomonas on culture. Overall I think that he does seem to be doing better though I am unsure whether or not they have actually been putting the cream on. The patient is not sure that we did talk to the nurse directly and she was going to initiate that treatment. Fortunately there does not appear to be any signs of active infection at this time. No fevers, chills, nausea, vomiting, or diarrhea. 4/28; the area on the right second toe is close to healed. Left medial ankle required debridement 12/07/2020 upon evaluation today patient appears to be doing well with regard to his wounds. In fact the right second toe appears to be completely healed which is great news. Fortunately there does not appear to be any signs of active infection at this time which is also great news. I think we can probably discontinue the gentamicin on top of everything else. 12/14/2020 upon evaluation today patient appears to be doing well with regard to his wound. He is making good progress and overall very pleased with where things stand today. There is no signs of active infection at this time which is great news. 12/28/2020 upon evaluation today patient appears to be doing well with regard to his wounds. He has been tolerating the dressing changes without complication. Fortunately there is no signs of active infection at this time. No fevers, chills, nausea, vomiting, or  diarrhea. 12/28/2020 upon evaluation today patient's wound bed actually showed signs of excellent improvement. He has great epithelization and granulation I do not see any signs of infection overall I am extremely pleased with where things stand at this point. No fevers, chills, nausea, vomiting, or diarrhea. 01/11/2021 upon evaluation today patient appears to be doing well with regard to his wound on his leg. He has been tolerating the dressing changes without  complication. Fortunately there does not appear to be any signs of active infection which is great news. No fevers, chills, nausea, vomiting, or diarrhea. 01/25/2021 upon evaluation today patient appears to be doing well with regard to his wound. He has been tolerating the dressing changes without complication. Fortunately the collagen seems to be doing a great job which is excellent news. No fevers, chills, nausea, vomiting, or diarrhea. 02/08/2021 upon evaluation today patient's wound is actually looking a little bit worse especially in the periwound compared to previous. Fortunately there does not appear to be any signs of infection which is great news with that being said he does have some irritation around the periphery of the wound which has me more concerned. He actually had a dressing on that had not been changed in 3 days. He also is supposed to have daily dressing changes. With regard to the dressing applied he had a silver alginate dressing and silver collagen is what is recommended and ordered. He also had no Desitin around the edges of the wound in the periwound region although that is on the order inspect to be done as well. In general I was very concerned I did contact [facility] actually spoke with Magda Paganini who is the scheduling individual and subsequently she stated that she would pass the information to the D ON apparently the D ON was not available to talk to me when I call today. 02/18/2021 upon evaluation today patient's wound is actually showing signs of improvement. Fortunately there does not appear to be any evidence of infection which is great news overall I am extremely pleased with where things stand today. No fevers, chills, nausea, vomiting, or diarrhea. 8/3; patient presents for 1 week follow-up. He has no issues or complaints today. He denies signs of infection. 03/11/2021 upon evaluation today patient appears to be doing well with regard to his wound. He does have a  little bit of slough noted on the surface of the wound but fortunately there does not appear to be any signs of active infection at this time. No fevers, chills, nausea, vomiting, or diarrhea. Electronic Signature(s) Signed: 03/11/2021 11:34:30 AM By: Worthy Keeler PA-C Entered By: Worthy Keeler on 03/11/2021 11:34:29 Chad [name] ([id number]) -------------------------------------------------------------------------------- Physical Exam Details Patient Name: Chad [name] Date of Service: 03/11/2021 11:00 AM Medical Record Number: [id number] Patient Account Number: [id number] Date of Birth/Sex: [birth year] (60 y.o. M) Treating RN: Donnamarie Poag Primary Care Provider: Clayborn Bigness Other Clinician: Referring Provider: Clayborn Bigness Treating Provider/Extender: Skipper Cliche in Treatment: 29 Constitutional Well-nourished and well-hydrated in no acute distress. Respiratory normal breathing without difficulty. Psychiatric this patient is able to make decisions and demonstrates good insight into disease process. Alert and Oriented x 3. pleasant and cooperative. Notes Upon inspection patient's wound bed actually showed signs of good granulation and epithelization at this point. Fortunately there does not appear to be any evidence of active infection which is great news. He does have some slough noted on the surface of the wound along with biofilm sharp debridement was performed to clear away  the necrotic debris he tolerated that today without complication. Electronic Signature(s) Signed: 03/11/2021 11:34:50 AM By: Worthy Keeler PA-C Entered By: Worthy Keeler on 03/11/2021 11:34:50 Chad [name] ([id number]) -------------------------------------------------------------------------------- Physician Orders Details Patient Name: Chad [name] Date of Service: 03/11/2021 11:00 AM Medical Record Number: [id number] Patient Account Number: [id number] Date of Birth/Sex: 1961-02-24 (60 y.o.  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 Bathing/ Shower/ Hygiene o May shower; gently cleanse wound with antibacterial soap, rinse and pat dry prior to dressing wounds Wound Treatment Wound #8 - Ankle Wound Laterality: Left, Medial Cleanser: Normal Saline 1 x Per Day/30 Days Discharge Instructions: Wash your hands with soap and water. Remove old dressing, discard into plastic bag and place into trash. Cleanse the wound with Normal Saline prior to applying a clean dressing using gauze sponges, not tissues or cotton balls. Do not scrub or use excessive force. Pat dry using gauze sponges, not tissue or cotton balls. Peri-Wound Care: Desitin Maximum Strength Ointment 4 (oz) 1 x Per Day/30 Days Discharge Instructions: Zinc periwound Primary Dressing: Prisma 4.34 (in) 1 x Per Day/30 Days Discharge Instructions: Moisten w/normal saline or sterile water; Cover wound as directed. Do not remove from wound bed. Secondary Dressing: ABD Pad 5x9 (in/in) 1 x Per Day/30 Days Discharge Instructions: Cover with ABD pad Secondary Dressing: Kerlix 4.5 x 4.1 (in/yd) 1 x Per Day/30 Days Discharge Instructions: Apply Kerlix 4.5 x 4.1 (in/yd) as instructed Secured With: 48M Medipore H Soft Cloth Surgical Tape, 2x2 (in/yd) 1 x Per Day/30 Days Discharge  Instructions: Secure kerlix Electronic Signature(s) Signed: 03/11/2021 11:50:15 AM By: Donnamarie Poag Signed: 03/11/2021 4:23:01 PM By: Worthy Keeler PA-C Entered By: Donnamarie Poag on 03/11/2021 11:30:01 Chad [name] ([id number]) -------------------------------------------------------------------------------- Problem List Details Patient Name: Chad [name] Date of Service: 03/11/2021 11:00 AM Medical Record Number: [id number] Patient Account Number: [id number] Date of Birth/Sex: Jul 09, 1961 (60 y.o. M) Treating RN: Donnamarie Poag Primary Care Provider: Clayborn Bigness Other Clinician: Referring Provider: Clayborn Bigness Treating Provider/Extender: Skipper Cliche in Treatment: 29 Active Problems ICD-10 Encounter Code Description Active Date MDM Diagnosis I87.2 Venous insufficiency (chronic) (peripheral) 08/14/2020 No Yes L97.322 Non-pressure chronic ulcer of left ankle with fat layer exposed 08/14/2020 No Yes L97.412 Non-pressure chronic ulcer of right heel and midfoot with fat layer 08/14/2020 No Yes exposed L97.512 Non-pressure chronic ulcer of other part of right foot with fat layer 08/14/2020 No Yes exposed 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 M) Treating RN: Donnamarie Poag Primary Care Provider: Clayborn Bigness Other Clinician: Referring Provider: Clayborn Bigness Treating Provider/Extender: Skipper Cliche in Treatment: 29 Subjective Chief  Complaint Information obtained  from Patient Left ankle and right foot and heel ulcers History of Present Illness (HPI) 10/08/18 on evaluation today patient actually presents to our office for initial evaluation concerning wounds that he has of the bilateral lower extremities. 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 He also has bilateral venous stasis. He apparently has been homeless currently following discharge from the hospital apparently he has been placed at [facility] which is is a skilled nursing facility locally. Nonetheless fortunately he does not show any signs of infection at this time which is good news. In fact several of the wound actually appears to be showing some signs of improvement already in my pinion. There are a couple areas in the left leg in particular there likely gonna require some sharp debridement to help clear away some necrotic tissue and help with more sufficient healing. No fevers, chills, nausea, or vomiting noted at this time. 10/15/18 on evaluation today patient actually appears to be doing very well in regard to his bilateral lower extremities. He's been tolerating the dressing changes without complication. Fortunately there does not appear to be any evidence of active infection at this time which is great news. Overall I'm actually very pleased with how this has progressed in just one visits time. Readmission: 08/14/2020 upon evaluation today patient presents for re-evaluation here in our clinic. He is having issues with his left ankle region as well as his right toe and his right heel. He tells me that the toe and heel actually began as a area that was itching that he was scratching and then subsequently opened up into wounds. These may have been abscess areas I presume based on what I am seeing  currently. With regard to his left ankle region he tells me this was a similar type occurrence although he does have venous stasis this very well may be more of a venous leg ulcer more than anything. Nonetheless I do believe that the patient would benefit from appropriate and aggressive wound care to try to help get things under better control here. He does have history of a stroke on the left side affecting him to some degree there that he is able to stand although he does have some residual weakness. Otherwise again the patient does have chronic venous insufficiency as previously noted. His arterial studies most recently obtained showed that he had an ABI on the right of 1.16 with a TBI of 0.52 and on the left and ABI of 1.14 with a TBI of 0.81. That was obtained on 06/19/2020. 08/28/2020 upon evaluation today patient appears to be doing decently well in regard to his wounds in general. He has been tolerating the dressing changes without complication. Fortunately there does not appear to be any signs of active infection which is great news. With that being said I think the [facility] is doing a good job I would recommend that we likely continue with that currently. 09/11/2020 upon evaluation today patient's wounds did not appear to be doing too poorly but again he is not really showing signs of significant improvement with regard to any of the wounds on the right. None of them have Hydrofera Blue on them I am not exactly sure why this is not being followed as the facility did not contact us to let us know of any issues with obtaining dressings or otherwise. With  that being said he is supposed to be using Hydrofera Blue on both of the wounds on the right foot as well as the ankle wound on the left side. 09/18/2020 upon evaluation today patient appears to be doing poorly with regard to his wounds. Again right now the left ankle in particular showed signs of extreme maceration. Apparently he was told  by someone with staff at [facility] they could not get the [facility]. With that being said this is something that is never been relayed to [place] one way or another. Also the patient subsequently has not supposed to have a border gauze dressing on. He should have an ABD pad and roll gauze to secure as this drains much too much just to have a border gauze dressing to cover. Nonetheless the fact that they are not using the appropriate dressing is directly causing deterioration of the left ankle wound it is significantly worse today compared to what it was previous. I did attempt to call [facility] while the patient was here I called three times and got no one to even pick up the phone. After this I had my for an office coordinator call and she was able to finally get through and leave a message with the D ON as of dictation of this note which is roughly about an hour and a half later I still have not been able to speak with anyone at the facility. 09/25/2020 upon evaluation today patient actually showing signs of good improvement which is excellent news. He has been tolerating the dressing changes without complication. Fortunately there is no signs of active infection which is great news. No fevers, chills, nausea, vomiting, or diarrhea. I do feel like the facility has been doing a much better job at taking care of him as far as the dressings are concerned. However the director of nursing never did call me back. 10/09/2020 upon evaluation today patient appears to be doing well with regard to his wound. The toe ulcer did require some debridement but the other 2 areas actually appear to be doing quite well. 10/19/2020 upon evaluation today patient actually appears to be doing very well in regard to his wounds. In fact the heel does appear to be completely healed. The toe is doing better in the medial ankle on the left is also doing better. Overall I think he is headed in the right  direction. 10/26/2020 upon evaluation today patient appears to be doing well with regard to his wound. He is showing signs of improvement which is great news and overall I am very pleased with where things stand today. No fevers, chills, nausea, vomiting, or diarrhea. 11/02/2020 upon evaluation today patient appears to be doing well with regard to his wounds. He has been tolerating the dressing changes without complication overall I am extremely pleased with where things stand today. He in regard to the toe is almost completely healed and the medial [facility], Chad ([id number]) ankle on the left is doing much better. 11/09/2020 upon evaluation today patient appears to be doing a little poorly in regard to his left medial ankle ulcer. Fortunately there does not appear to be any signs of systemic infection but unfortunately locally he does appear to be infected in fact he has blue-green drainage consistent with Pseudomonas. 11/16/2020 upon evaluation today patient appears to be doing well with regard to his wound. It actually appears to be doing better. I did place him on gentamicin cream since the Cipro was actually resistant even  though he was positive for Pseudomonas on culture. Overall I think that he does seem to be doing better though I am unsure whether or not they have actually been putting the cream on. The patient is not sure that we did talk to the nurse directly and she was going to initiate that treatment. Fortunately there does not appear to be any signs of active infection at this time. No fevers, chills, nausea, vomiting, or diarrhea. 4/28; the area on the right second toe is close to healed. Left medial ankle required debridement 12/07/2020 upon evaluation today patient appears to be doing well with regard to his wounds. In fact the right second toe appears to be completely healed which is great news. Fortunately there does not appear to be any signs of active infection at this time which is  also great news. I think we can probably discontinue the gentamicin on top of everything else. 12/14/2020 upon evaluation today patient appears to be doing well with regard to his wound. He is making good progress and overall very pleased with where things stand today. There is no signs of active infection at this time which is great news. 12/28/2020 upon evaluation today patient appears to be doing well with regard to his wounds. He has been tolerating the dressing changes without complication. Fortunately there is no signs of active infection at this time. No fevers, chills, nausea, vomiting, or diarrhea. 12/28/2020 upon evaluation today patient's wound bed actually showed signs of excellent improvement. He has great epithelization and granulation I do not see any signs of infection overall I am extremely pleased with where things stand at this point. No fevers, chills, nausea, vomiting, or diarrhea. 01/11/2021 upon evaluation today patient appears to be doing well with regard to his wound on his leg. He has been tolerating the dressing changes without complication. Fortunately there does not appear to be any signs of active infection which is great news. No fevers, chills, nausea, vomiting, or diarrhea. 01/25/2021 upon evaluation today patient appears to be doing well with regard to his wound. He has been tolerating the dressing changes without complication. Fortunately the collagen seems to be doing a great job which is excellent news. No fevers, chills, nausea, vomiting, or diarrhea. 02/08/2021 upon evaluation today patient's wound is actually looking a little bit worse especially in the periwound compared to previous. Fortunately there does not appear to be any signs of infection which is great news with that being said he does have some irritation around the periphery of the wound which has me more concerned. He actually had a dressing on that had not been changed in 3 days. He also is supposed  to have daily dressing changes. With regard to the dressing applied he had a silver alginate dressing and silver collagen is what is recommended and ordered. He also had no Desitin around the edges of the wound in the periwound region although that is on the order inspect to be done as well. In general I was very concerned I did contact  healthcare actually spoke with Magda Paganini who is the scheduling individual and subsequently she stated that she would pass the information to the D ON apparently the D ON was not available to talk to me when I call today. 02/18/2021 upon evaluation today patient's wound is actually showing signs of improvement. Fortunately there does not appear to be any evidence of infection which is great news overall I am extremely pleased with where things stand today. No fevers, chills,  nausea, vomiting, or diarrhea. 8/3; patient presents for 1 week follow-up. He has no issues or complaints today. He denies signs of infection. 03/11/2021 upon evaluation today patient appears to be doing well with regard to his wound. He does have a little bit of slough noted on the surface of the wound but fortunately there does not appear to be any signs of active infection at this time. No fevers, chills, nausea, vomiting, or diarrhea. Objective Constitutional Well-nourished and well-hydrated in no acute distress. Vitals Time Taken: 11:14 AM, Height: 67 in, Weight: 160 lbs, BMI: 25.1, Temperature: 98.3 F, Pulse: 74 bpm, Respiratory Rate: 16 breaths/min, Blood Pressure: 126/85 mmHg. Respiratory normal breathing without difficulty. Psychiatric this patient is able to make decisions and demonstrates good insight into disease process. Alert and Oriented x 3. pleasant and cooperative. General Notes: Upon inspection patient's wound bed actually showed signs of good granulation and epithelization at this point. Fortunately there does not appear to be any evidence of active infection which is  great news. He does have some slough noted on the surface of the wound along with biofilm sharp debridement was performed to clear away the necrotic debris he tolerated that today without complication. Chad [name], Chad [name] ([id number]) Integumentary (Hair, Skin) Wound #8 status is Open. Original cause of wound was Gradually Appeared. The date acquired was: 07/12/2019. The wound has been in treatment 29 weeks. The wound is located on the Left,Medial Ankle. The wound measures 2.1cm length x 1.1cm width x 0.1cm depth; 1.814cm^2 area and 0.181cm^3 volume. There is Fat Layer (Subcutaneous Tissue) exposed. There is no tunneling or undermining noted. There is a medium amount of serosanguineous drainage noted. The wound margin is flat and intact. There is large (67-100%) pink, pale granulation within the wound bed. There is a small (1-33%) amount of necrotic tissue within the wound bed including Adherent Slough. 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 There was a Excisional Skin/Subcutaneous Tissue Debridement with a total area of 2.31 sq cm performed by Tommie Sams., PA-C. With the following instrument(s): Curette to remove Viable and Non-Viable tissue/material. Material removed includes Subcutaneous Tissue, Slough, and Biofilm after achieving pain control using Lidocaine. A time out was conducted at 11:27, prior to the start of the procedure. A Minimum amount of bleeding was controlled with  Pressure. The procedure was tolerated well. Post Debridement Measurements: 2.1cm length x 1.1cm width x 0.1cm depth; 0.181cm^3 volume. Character of Wound/Ulcer Post Debridement is improved. Severity of Tissue Post Debridement is: Fat layer exposed. Post procedure Diagnosis Wound #8: Same as Pre-Procedure Plan Follow-up Appointments: Return Appointment in 1 week. Bathing/ Shower/ Hygiene: May shower; gently cleanse wound with antibacterial soap, rinse and pat dry prior to dressing wounds WOUND #8: - Ankle Wound Laterality: Left, Medial Cleanser: Normal Saline 1 x Per Day/30 Days Discharge Instructions: Wash your hands with soap and water. Remove old dressing, discard into plastic bag and place into trash. Cleanse the wound with Normal Saline prior to applying a clean dressing using gauze sponges, not tissues or cotton balls. Do not scrub or use excessive force.  Pat dry using gauze sponges, not tissue or cotton balls. Peri-Wound Care: Desitin Maximum Strength Ointment 4 (oz) 1 x Per Day/30 Days Discharge Instructions: Zinc periwound Primary Dressing: Prisma 4.34 (in) 1 x Per Day/30 Days Discharge Instructions: Moisten w/normal saline or sterile water; Cover wound as directed. Do not remove from wound bed. Secondary Dressing: ABD Pad 5x9 (in/in) 1 x Per Day/30 Days Discharge Instructions: Cover with ABD pad Secondary Dressing: Kerlix 4.5 x 4.1 (in/yd) 1 x Per Day/30 Days Discharge Instructions: Apply Kerlix 4.5 x 4.1 (in/yd) as instructed Secured With: 39M Medipore H Soft Cloth Surgical Tape, 2x2 (in/yd) 1 x Per Day/30 Days Discharge Instructions: Secure kerlix 1. Would recommend currently that we going continue with the silver collagen which I think is doing a great job. 2. I am also can recommend the patient continue with the Desitin around the wound. 3. And also can recommend that he continue with ABD pad and roll gauze to secure in place. We will see patient back for reevaluation in 1  week here in the clinic. If anything worsens or changes patient will contact our office for additional recommendations. Chad [name], Chad [name] ([id number]) Electronic Signature(s) Signed: 03/11/2021 11:35:07 AM By: Worthy Keeler PA-C Entered By: Worthy Keeler on 03/11/2021 11:35:07 Chad [name] ([id number]) -------------------------------------------------------------------------------- SuperBill Details Patient Name: Chad [name] Date of Service: 03/11/2021 Medical Record Number: [id number] Patient Account Number: [id number] Date of Birth/Sex: 09/24/1960 (60 y.o. M) Treating RN: Donnamarie Poag Primary Care Provider: Clayborn Bigness Other Clinician: Referring Provider: Clayborn Bigness Treating Provider/Extender: Skipper Cliche in Treatment: 29 Diagnosis Coding ICD-10 Codes Code Description I87.2 Venous insufficiency (chronic) (peripheral) L97.322 Non-pressure chronic ulcer of left ankle with fat layer exposed L97.412 Non-pressure chronic ulcer of right heel and midfoot with fat layer exposed L97.512 Non-pressure chronic ulcer of other part of right foot with fat layer exposed 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 side Facility Procedures CPT4 Code: [id number] Description: 11042 - DEB SUBQ TISSUE 20 SQ CM/< Modifier: Quantity: 1 CPT4 Code: Description: ICD-10 Diagnosis Description L97.322 Non-pressure chronic ulcer of left ankle with fat layer exposed Modifier: Quantity: Physician Procedures CPT4 Code: [id number] Description: 11042 - WC PHYS SUBQ TISS 20 SQ CM Modifier: Quantity: 1 CPT4 Code: Description: ICD-10 Diagnosis Description L97.322 Non-pressure chronic ulcer of left ankle with fat layer exposed Modifier: Quantity: Electronic Signature(s) Signed: 03/11/2021 4:40:31 PM By: Donnamarie Poag Previous Signature: 03/11/2021 11:35:19 AM Version By: Worthy Keeler PA-C Entered By: Donnamarie Poag on 03/11/2021 16:40:31

## 2021-03-11 NOTE — Progress Notes (Addendum)
Chad, DOELL ([id number]) Visit Report for 03/11/2021 Arrival Information Details Patient Name: Chad [name], Chad [name] Date of Service: 03/11/2021 11:00 AM Medical Record Number: [id number] Patient Account Number: [id number] Date of Birth/Sex: Aug 27, 1960 (60 y.o. M) Treating RN: Donnamarie Poag Primary Care Merton Wadlow: Clayborn Bigness Other Clinician: Referring Ashtian Villacis: Clayborn Bigness Treating Austen Wygant/Extender: Skipper Cliche in Treatment: 29 Visit Information History Since Last Visit Added or deleted any medications: No Patient Arrived: Wheel Chair Had a fall or experienced change in No Arrival Time: 11:06 activities of daily living that may affect Accompanied By: self risk of falls: Transfer Assistance: EasyPivot Patient Lift Hospitalized since last visit: No Patient Identification Verified: Yes Has Dressing in Place as Prescribed: Yes Secondary Verification Process Completed: Yes Pain Present Now: No Patient Requires Transmission-Based No Precautions: Patient Has Alerts: Yes Patient Alerts: Patient on Blood Thinner ABI right 1.16 ABI left 1.14 Electronic Signature(s) Signed: 03/11/2021 11:50:15 AM By: Donnamarie Poag Entered By: Donnamarie Poag on 03/11/2021 11:13:47 Chad [name] ([id number]) -------------------------------------------------------------------------------- Clinic Level of Care Assessment Details Patient Name: Chad [name] Date of Service: 03/11/2021 11:00 AM Medical Record Number: [id number] Patient Account Number: [id number] Date of Birth/Sex: Sep 11, 1960 (60 y.o. M) Treating RN: Donnamarie Poag Primary Care Cheikh Bramble: Clayborn Bigness Other Clinician: Referring Mekenzie Modeste: Clayborn Bigness Treating Casandra Dallaire/Extender: Skipper Cliche in Treatment: 29 Clinic Level of Care Assessment Items TOOL 4 Quantity Score '[]'$  - Use when only an EandM is performed on FOLLOW-UP visit 0 ASSESSMENTS - Nursing Assessment / Reassessment '[]'$  - Reassessment of Co-morbidities (includes updates in patient  status) 0 '[]'$  - 0 Reassessment of Adherence to Treatment Plan ASSESSMENTS - Wound and Skin Assessment / Reassessment X - Simple Wound Assessment / Reassessment - one wound 1 5 '[]'$  - 0 Complex Wound Assessment / Reassessment - multiple wounds '[]'$  - 0 Dermatologic / Skin Assessment (not related to wound area) ASSESSMENTS - Focused Assessment '[]'$  - Circumferential Edema Measurements - multi extremities 0 '[]'$  - 0 Nutritional Assessment / Counseling / Intervention '[]'$  - 0 Lower Extremity Assessment (monofilament, tuning fork, pulses) '[]'$  - 0 Peripheral Arterial Disease Assessment (using hand held doppler) ASSESSMENTS - Ostomy and/or Continence Assessment and Care '[]'$  - Incontinence Assessment and Management 0 '[]'$  - 0 Ostomy Care Assessment and Management (repouching, etc.) PROCESS - Coordination of Care X - Simple Patient / Family Education for ongoing care 1 15 '[]'$  - 0 Complex (extensive) Patient / Family Education for ongoing care '[]'$  - 0 Staff obtains Programmer, systems, Records, Test Results / Process Orders X- 1 10 Staff telephones HHA, Nursing Homes / Clarify orders / etc '[]'$  - 0 Routine Transfer to another Facility (non-emergent condition) '[]'$  - 0 Routine Hospital Admission (non-emergent condition) '[]'$  - 0 New Admissions / Biomedical engineer / Ordering NPWT, Apligraf, etc. '[]'$  - 0 Emergency Hospital Admission (emergent condition) X- 1 10 Simple Discharge Coordination '[]'$  - 0 Complex (extensive) Discharge Coordination PROCESS - Special Needs '[]'$  - Pediatric / Minor Patient Management 0 '[]'$  - 0 Isolation Patient Management '[]'$  - 0 Hearing / Language / Visual special needs '[]'$  - 0 Assessment of Community assistance (transportation, D/C planning, etc.) '[]'$  - 0 Additional assistance / Altered mentation '[]'$  - 0 Support Surface(s) Assessment (bed, cushion, seat, etc.) INTERVENTIONS - Wound Cleansing / Measurement Holwerda, Sunny ([id number]) X- 1 5 Simple Wound Cleansing - one wound '[]'$  -  0 Complex Wound Cleansing - multiple wounds X- 1 5 Wound Imaging (photographs - any number of wounds) '[]'$  - 0 Wound Tracing (instead of photographs) X- 1 5 Simple Wound Measurement -  one wound '[]'$  - 0 Complex Wound Measurement - multiple wounds INTERVENTIONS - Wound Dressings X - Small Wound Dressing one or multiple wounds 1 10 '[]'$  - 0 Medium Wound Dressing one or multiple wounds '[]'$  - 0 Large Wound Dressing one or multiple wounds X- 1 5 Application of Medications - topical '[]'$  - 0 Application of Medications - injection INTERVENTIONS - Miscellaneous '[]'$  - External ear exam 0 '[]'$  - 0 Specimen Collection (cultures, biopsies, blood, body fluids, etc.) '[]'$  - 0 Specimen(s) / Culture(s) sent or taken to Lab for analysis '[]'$  - 0 Patient Transfer (multiple staff / Civil Service fast streamer / Similar devices) '[]'$  - 0 Simple Staple / Suture removal (25 or less) '[]'$  - 0 Complex Staple / Suture removal (26 or more) '[]'$  - 0 Hypo / Hyperglycemic Management (close monitor of Blood Glucose) '[]'$  - 0 Ankle / Brachial Index (ABI) - do not check if billed separately X- 1 5 Vital Signs Has the patient been seen at the hospital within the last three years: Yes Total Score: 75 Level Of Care: New/Established - Level 2 Electronic Signature(s) Signed: 03/11/2021 11:50:15 AM By: Donnamarie Poag Entered By: Donnamarie Poag on 03/11/2021 11:30:35 Chad [name] ([id number]) -------------------------------------------------------------------------------- Encounter Discharge Information Details Patient Name: Chad [name] Date of Service: 03/11/2021 11:00 AM Medical Record Number: [id number] Patient Account Number: [id number] Date of Birth/Sex: 1961-03-19 (60 y.o. M) Treating RN: Donnamarie Poag Primary Care Jrue Jarriel: Clayborn Bigness Other Clinician: Referring Jaynee Winters: Clayborn Bigness Treating Ariyanna Oien/Extender: Skipper Cliche in Treatment: 29 Encounter Discharge Information Items Post Procedure Vitals Discharge Condition:  Stable Temperature (F): 98.3 Ambulatory Status: Wheelchair Pulse (bpm): 74 Discharge Destination: [place] Respiratory Rate (breaths/min): 16 Telephoned: No Blood Pressure (mmHg): 126/85 Orders Sent: Yes Transportation: Other Accompanied By: self Schedule Follow-up Appointment: Yes Clinical Summary of Care: Electronic Signature(s) Signed: 03/11/2021 11:50:15 AM By: Donnamarie Poag Entered By: Donnamarie Poag on 03/11/2021 11:32:01 Chad [name] ([id number]) -------------------------------------------------------------------------------- Lower Extremity Assessment Details Patient Name: Chad [name] Date of Service: 03/11/2021 11:00 AM Medical Record Number: [id number] Patient Account Number: [id number] Date of Birth/Sex: [birth year] (60 y.o. M) Treating RN: Donnamarie Poag Primary Care Donnald Tabar: Clayborn Bigness Other Clinician: Referring Quade Ramirez: Clayborn Bigness Treating Lejuan Botto/Extender: Jeri Cos Weeks in Treatment: 29 Edema Assessment Assessed: [Left: Yes] [Right: No] Edema: [Left: N] [Right: o] Vascular Assessment Pulses: Dorsalis Pedis Doppler Audible: [Left:Yes] Electronic Signature(s) Signed: 03/11/2021 11:50:15 AM By: Donnamarie Poag Entered By: Donnamarie Poag on 03/11/2021 11:17:56 Chad [name] ([id number]) -------------------------------------------------------------------------------- Multi Wound Chart Details Patient Name: Chad [name] Date of Service: 03/11/2021 11:00 AM Medical Record Number: [id number] Patient Account Number: [id number] Date of Birth/Sex: May 04, 1961 (60 y.o. M) Treating RN: Donnamarie Poag Primary Care Rita Vialpando: Clayborn Bigness Other Clinician: Referring Pantera Winterrowd: Clayborn Bigness Treating Myan Locatelli/Extender: Skipper Cliche in Treatment: 29 Vital Signs Height(in): 67 Pulse(bpm): 67 Weight(lbs): 160 Blood Pressure(mmHg): 126/85 Body Mass Index(BMI): 25 Temperature(F): 98.3 Respiratory Rate(breaths/min): 16 Photos: [N/A:N/A] Wound Location:  Left, Medial Ankle N/A N/A Wounding Event: Gradually Appeared N/A N/A Primary Etiology: Venous Leg Ulcer N/A N/A Comorbid History: Anemia, Chronic Obstructive N/A N/A Pulmonary Disease (COPD), Hepatitis C, Received Chemotherapy Date Acquired: 07/12/2019 N/A N/A Weeks of Treatment: 29 N/A N/A Wound Status: Open N/A N/A Measurements L x W x D (cm) 2.1x1.1x0.1 N/A N/A Area (cm) : 1.814 N/A N/A Volume (cm) : 0.181 N/A N/A % Reduction in Area: 69.20% N/A N/A % Reduction in Volume: 69.30% N/A N/A Classification: Full Thickness Without Exposed N/A N/A Support Structures Exudate Amount: Medium N/A N/A Exudate Type: Serosanguineous N/A  N/A Exudate Color: red, brown N/A N/A Wound Margin: Flat and Intact N/A N/A Granulation Amount: Large (67-100%) N/A N/A Granulation Quality: Pink, Pale N/A N/A Necrotic Amount: Small (1-33%) N/A N/A Exposed Structures: Fat Layer (Subcutaneous Tissue): N/A N/A Yes Fascia: No Tendon: No Muscle: No Joint: No Bone: No Epithelialization: Medium (34-66%) N/A N/A Treatment Notes Electronic Signature(s) Signed: 03/11/2021 11:50:15 AM By: Donnamarie Poag Entered By: Donnamarie Poag on 03/11/2021 11:19:26 Chad [name] ([id number]) -------------------------------------------------------------------------------- Multi-Disciplinary Care Plan Details Patient Name: Chad [name] Date of Service: 03/11/2021 11:00 AM Medical Record Number: [id number] Patient Account Number: [id number] Date of Birth/Sex: [birth year] (60 y.o. M) Treating RN: Donnamarie Poag Primary Care Yan Okray: Clayborn Bigness Other Clinician: Referring Lazlo Tunney: Clayborn Bigness Treating Hoa Deriso/Extender: Jeri Cos Weeks in Treatment: 29 Active Inactive Electronic Signature(s) Signed: 03/11/2021 11:50:15 AM By: Donnamarie Poag Entered By: Donnamarie Poag on 03/11/2021 11:18:17 Chad [name] ([id number]) -------------------------------------------------------------------------------- Pain Assessment  Details Patient Name: Chad [name] Date of Service: 03/11/2021 11:00 AM Medical Record Number: [id number] Patient Account Number: [id number] Date of Birth/Sex: 06/09/1961 (60 y.o. M) Treating RN: Donnamarie Poag Primary Care Nikitas Davtyan: Clayborn Bigness Other Clinician: Referring Stryder Poitra: Clayborn Bigness Treating Averey Koning/Extender: Skipper Cliche in Treatment: 29 Active Problems Location of Pain Severity and Description of Pain Patient Has Paino No Site Locations Rate the pain. Current Pain Level: 0 Pain Management and Medication Current Pain Management: Electronic Signature(s) Signed: 03/11/2021 11:50:15 AM By: Donnamarie Poag Entered By: Donnamarie Poag on 03/11/2021 11:16:08 Chad [name] ([id number]) -------------------------------------------------------------------------------- Patient/Caregiver Education Details Patient Name: Chad [name] Date of Service: 03/11/2021 11:00 AM Medical Record Number: [id number] Patient Account Number: [id number] Date of Birth/Gender: [birth year] (60 y.o. M) Treating RN: Donnamarie Poag Primary Care Physician: Clayborn Bigness Other Clinician: Referring Physician: Clayborn Bigness Treating Physician/Extender: Skipper Cliche in Treatment: 29 Education Assessment Education Provided To: Patient Education Topics Provided Basic Hygiene: Wound Debridement: Wound/Skin Impairment: Electronic Signature(s) Signed: 03/11/2021 11:50:15 AM By: Donnamarie Poag Entered By: Donnamarie Poag on 03/11/2021 11:30:58 Chad [name] ([id number]) -------------------------------------------------------------------------------- Wound Assessment Details Patient Name: Chad [name] Date of Service: 03/11/2021 11:00 AM Medical Record Number: [id number] Patient Account Number: [id number] Date of Birth/Sex: 24-Oct-1960 (60 y.o. M) Treating RN: Donnamarie Poag Primary Care Cullen Vanallen: Clayborn Bigness Other Clinician: Referring Jovanne Riggenbach: Clayborn Bigness Treating Elaine Roanhorse/Extender: Jeri Cos Weeks in  Treatment: 29 Wound Status Wound Number: 8 Primary Venous Leg Ulcer Etiology: Wound Location: Left, Medial Ankle Wound Open Wounding Event: Gradually Appeared Status: Date Acquired: 07/12/2019 Comorbid Anemia, Chronic Obstructive Pulmonary Disease (COPD), Weeks Of Treatment: 29 History: Hepatitis C, Received Chemotherapy Clustered Wound: No Photos Wound Measurements Length: (cm) 2.1 Width: (cm) 1.1 Depth: (cm) 0.1 Area: (cm) 1.814 Volume: (cm) 0.181 % Reduction in Area: 69.2% % Reduction in Volume: 69.3% Epithelialization: Medium (34-66%) Tunneling: No Undermining: No Wound Description Classification: Full Thickness Without Exposed Support Structures Wound Margin: Flat and Intact Exudate Amount: Medium Exudate Type: Serosanguineous Exudate Color: red, brown Foul Odor After Cleansing: No Slough/Fibrino Yes Wound Bed Granulation Amount: Large (67-100%) Exposed Structure Granulation Quality: Pink, Pale Fascia Exposed: No Necrotic Amount: Small (1-33%) Fat Layer (Subcutaneous Tissue) Exposed: Yes Necrotic Quality: Adherent Slough Tendon Exposed: No Muscle Exposed: No Joint Exposed: No Bone Exposed: No Treatment Notes Wound #8 (Ankle) Wound Laterality: Left, Medial Cleanser Normal Saline Discharge Instruction: Wash your hands with soap and water. Remove old dressing, discard into plastic bag and place into trash. Cleanse the wound with Normal Saline prior to applying a clean dressing using gauze sponges, not tissues or cotton balls. Do not scrub or use  excessive force. Pat dry using gauze sponges, not tissue or cotton balls. TALLEY, SCHLUND ([id number]) Peri-Wound Care Desitin Maximum Strength Ointment 4 (oz) Discharge Instruction: Zinc periwound Topical Primary Dressing Prisma 4.34 (in) Discharge Instruction: Moisten w/normal saline or sterile water; Cover wound as directed. Do not remove from wound bed. Secondary Dressing ABD Pad 5x9 (in/in) Discharge  Instruction: Cover with ABD pad Kerlix 4.5 x 4.1 (in/yd) Discharge Instruction: Apply Kerlix 4.5 x 4.1 (in/yd) as instructed Secured With Box Surgical Tape, 2x2 (in/yd) Discharge Instruction: Secure kerlix Compression Wrap Compression Stockings Add-Ons Electronic Signature(s) Signed: 03/11/2021 11:50:15 AM By: Donnamarie Poag Entered By: Donnamarie Poag on 03/11/2021 11:17:01 Chad [name] ([id number]) -------------------------------------------------------------------------------- [place] Details Patient Name: Chad [name] Date of Service: 03/11/2021 11:00 AM Medical Record Number: [id number] Patient Account Number: [id number] Date of Birth/Sex: 01-Jan-1961 (60 y.o. M) Treating RN: Donnamarie Poag Primary Care Yailine Ballard: Clayborn Bigness Other Clinician: Referring Kenyon Eshleman: Clayborn Bigness Treating [place] Bryngelson/Extender: Skipper Cliche in Treatment: 29 Vital Signs Time Taken: 11:14 Temperature (F): 98.3 Height (in): 67 Pulse (bpm): 74 Weight (lbs): 160 Respiratory Rate (breaths/min): 16 Body Mass Index (BMI): 25.1 Blood Pressure (mmHg): 126/85 Reference Range: 80 - 120 mg / dl Electronic Signature(s) Signed: 03/11/2021 11:50:15 AM By: Donnamarie Poag Entered ByDonnamarie Poag on 03/11/2021 11:14:29

## 2021-03-13 ENCOUNTER — Inpatient Hospital Stay: Payer: Medicaid Other

## 2021-03-13 ENCOUNTER — Inpatient Hospital Stay: Payer: Medicaid Other | Admitting: Internal Medicine

## 2021-03-18 ENCOUNTER — Other Ambulatory Visit: Payer: Self-pay

## 2021-03-18 ENCOUNTER — Encounter: Payer: Medicaid Other | Admitting: Physician Assistant

## 2021-03-18 DIAGNOSIS — L97512 Non-pressure chronic ulcer of other part of right foot with fat layer exposed: Secondary | ICD-10-CM | POA: Diagnosis not present

## 2021-03-18 NOTE — Progress Notes (Signed)
Chad [name], Chad [name] ([id number]) Visit Report for 03/18/2021 Arrival Information Details Patient Name: Chad [name] Date of Service: 03/18/2021 8:15 AM Medical Record Number: [id number] Patient Account Number: [id number] Date of Birth/Sex: [birth year] (60 y.o. M) Treating RN: Chad [name] Primary Care Chad [name]: Chad [name] Other Clinician: Referring Chad [name]: Chad [name] Treating Trig Mcbryar/Extender: Chad [name]: 30 Visit Information History Since Last Visit Pain Present Now: No Patient Arrived: Wheel Chair Arrival Time: 08:21 Accompanied By: self Transfer Assistance: None Patient Identification Verified: Yes Secondary Verification Process Completed: Yes Patient Requires Transmission-Based No Precautions: Patient Has Alerts: Yes Patient Alerts: Patient on Blood Thinner ABI right 1.16 ABI left 1.14 Electronic Signature(s) Signed: 03/18/2021 4:26:23 PM By: Chad Amen RN Entered By: Chad [name] on 03/18/2021 08:22:02 Chad [name] ([id number]) -------------------------------------------------------------------------------- Clinic Level of Care Assessment Details Patient Name: Chad [name] Date of Service: 03/18/2021 8:15 AM Medical Record Number: [id number] Patient Account Number: [id number] Date of Birth/Sex: [birth year] (60 y.o. M) Treating RN: Chad [name] Primary Care Ananda Sitzer: Chad [name] Other Clinician: Referring Chad [name]: Chad [name] Treating Aeon Koors/Extender: Chad [name]: 30 Clinic Level of Care Assessment Items TOOL 1 Quantity Score '[]'$  - Use when EandM and Procedure is performed on INITIAL visit 0 ASSESSMENTS - Nursing Assessment / Reassessment '[]'$  - General Physical Exam (combine w/ comprehensive assessment (listed just below) when performed on new 0 pt. evals) '[]'$  - 0 Comprehensive Assessment (HX, ROS, Risk Assessments, Wounds Hx, etc.) ASSESSMENTS - Wound and Skin Assessment / Reassessment '[]'$  - Dermatologic / Skin  Assessment (not related to wound area) 0 ASSESSMENTS - Ostomy and/or Continence Assessment and Care '[]'$  - Incontinence Assessment and Management 0 '[]'$  - 0 Ostomy Care Assessment and Management (repouching, etc.) PROCESS - Coordination of Care '[]'$  - Simple Patient / Family Education for ongoing care 0 '[]'$  - 0 Complex (extensive) Patient / Family Education for ongoing care '[]'$  - 0 Staff obtains Programmer, systems, Records, Test Results / Process Orders '[]'$  - 0 Staff telephones HHA, Nursing Homes / Clarify orders / etc '[]'$  - 0 Routine Transfer to another Facility (non-emergent condition) '[]'$  - 0 Routine Hospital Admission (non-emergent condition) '[]'$  - 0 New Admissions / Biomedical engineer / Ordering NPWT, Apligraf, etc. '[]'$  - 0 Emergency Hospital Admission (emergent condition) PROCESS - Special Needs '[]'$  - Pediatric / Minor Patient Management 0 '[]'$  - 0 Isolation Patient Management '[]'$  - 0 Hearing / Language / Visual special needs '[]'$  - 0 Assessment of Community assistance (transportation, D/C planning, etc.) '[]'$  - 0 Additional assistance / Altered mentation '[]'$  - 0 Support Surface(s) Assessment (bed, cushion, seat, etc.) INTERVENTIONS - Miscellaneous '[]'$  - External ear exam 0 '[]'$  - 0 Patient Transfer (multiple staff / Civil Service fast streamer / Similar devices) '[]'$  - 0 Simple Staple / Suture removal (25 or less) '[]'$  - 0 Complex Staple / Suture removal (26 or more) '[]'$  - 0 Hypo/Hyperglycemic Management (do not check if billed separately) '[]'$  - 0 Ankle / Brachial Index (ABI) - do not check if billed separately Has the patient been seen at the hospital within the last three years: Yes Total Score: 0 Level Of Care: ____ Chad [name] ([id number]) Electronic Signature(s) Signed: 03/18/2021 4:26:23 PM By: Chad Amen RN Entered By: Chad [name] on 03/18/2021 08:44:17 Chad [name] ([id number]) -------------------------------------------------------------------------------- Encounter Discharge  Information Details Patient Name: Chad [name] Date of Service: 03/18/2021 8:15 AM Medical Record Number: [id number] Patient Account Number: [id number] Date of Birth/Sex: [birth year] (60 y.o. M) Treating RN: Chad [name] Primary Care Geraline Halberstadt: Chad [name] Other  Clinician: Referring Merriam Brandner: Chad [name] Treating Sheretha Shadd/Extender: Chad [name]: 30 Encounter Discharge Information Items Post Procedure Vitals Discharge Condition: Stable Temperature (F): 98.7 Ambulatory Status: Wheelchair Pulse (bpm): 81 Discharge Destination: [place] Respiratory Rate (breaths/min): 18 Orders Sent: Yes Blood Pressure (mmHg): 112/73 Transportation: Private Auto Accompanied By: self Schedule Follow-up Appointment: Yes Clinical Summary of Care: Electronic Signature(s) Signed: 03/18/2021 9:36:17 AM By: Chad Amen RN Entered By: Chad [name] on 03/18/2021 09:36:17 Chad [name] ([id number]) -------------------------------------------------------------------------------- Lower Extremity Assessment Details Patient Name: Chad [name] Date of Service: 03/18/2021 8:15 AM Medical Record Number: [id number] Patient Account Number: [id number] Date of Birth/Sex: 1961/03/15 (60 y.o. M) Treating RN: Chad [name] Primary Care Berklie Dethlefs: Chad [name] Other Clinician: Referring Bronsyn Shappell: Chad [name] Treating Ellagrace Yoshida/Extender: Jeri Cos Weeks in [name]: 30 Edema Assessment Assessed: [Left: Yes] [Right: No] Edema: [Left: N] [Right: o] Vascular Assessment Pulses: Dorsalis Pedis Palpable: [Left:Yes] Electronic Signature(s) Signed: 03/18/2021 4:26:23 PM By: Chad Amen RN Entered By: Chad [name] on 03/18/2021 08:31:33 Chad [name] ([id number]) -------------------------------------------------------------------------------- Multi Wound Chart Details Patient Name: Chad [name] Date of Service: 03/18/2021 8:15 AM Medical Record Number:  [id number] Patient Account Number: [id number] Date of Birth/Sex: 1960/09/08 (60 y.o. M) Treating RN: Chad [name] Primary Care Salvatrice Morandi: Chad [name] Other Clinician: Referring Antuane Eastridge: Chad [name] Treating Leonce Bale/Extender: Chad [name]: 30 Vital Signs Height(in): 67 Pulse(bpm): 41 Weight(lbs): 160 Blood Pressure(mmHg): 112/73 Body Mass Index(BMI): 25 Temperature(F): 98.7 Respiratory Rate(breaths/min): 18 Photos: [N/A:N/A] Wound Location: Left, Medial Ankle N/A N/A Wounding Event: Gradually Appeared N/A N/A Primary Etiology: Venous Leg Ulcer N/A N/A Comorbid History: Anemia, Chronic Obstructive N/A N/A Pulmonary Disease (COPD), Hepatitis C, Received Chemotherapy Date Acquired: 07/12/2019 N/A N/A Weeks of [name]: 30 N/A N/A Wound Status: Open N/A N/A Measurements L x W x D (cm) 2.2x1.6x0.1 N/A N/A Area (cm) : 2.765 N/A N/A Volume (cm) : 0.276 N/A N/A % Reduction in Area: 53.10% N/A N/A % Reduction in Volume: 53.10% N/A N/A Classification: Full Thickness Without Exposed N/A N/A Support Structures Exudate Amount: Medium N/A N/A Exudate Type: Serosanguineous N/A N/A Exudate Color: red, brown N/A N/A Wound Margin: Flat and Intact N/A N/A Granulation Amount: Medium (34-66%) N/A N/A Granulation Quality: Pink N/A N/A Necrotic Amount: Medium (34-66%) N/A N/A Exposed Structures: Fat Layer (Subcutaneous Tissue): N/A N/A Yes Fascia: No Tendon: No Muscle: No Joint: No Bone: No Epithelialization: Medium (34-66%) N/A N/A Assessment Notes: irritation noted above wound N/A N/A [name] Notes Electronic Signature(s) Signed: 03/18/2021 4:26:23 PM By: Chad Amen RN Entered By: Chad [name] on 03/18/2021 08:40:54 Chad [name] ([id number]) Chad [name] ([id number]) -------------------------------------------------------------------------------- Multi-Disciplinary Care Plan Details Patient Name: Chad [name] Date of Service: 03/18/2021  8:15 AM Medical Record Number: [id number] Patient Account Number: [id number] Date of Birth/Sex: Sep 26, 1960 (60 y.o. M) Treating RN: Chad [name] Primary Care Maxximus Gotay: Chad [name] Other Clinician: Referring Sonja Manseau: Chad [name] Treating Lejuan Botto/Extender: Jeri Cos Weeks in [name]: 30 Active Inactive Electronic Signature(s) Signed: 03/18/2021 4:26:23 PM By: Chad Amen RN Entered By: Chad [name] on 03/18/2021 08:40:46 Chad [name] ([id number]) -------------------------------------------------------------------------------- Pain Assessment Details Patient Name: Chad [name] Date of Service: 03/18/2021 8:15 AM Medical Record Number: [id number] Patient Account Number: [id number] Date of Birth/Sex: 1961-07-10 (60 y.o. M) Treating RN: Chad [name] Primary Care Venissa Nappi: Chad [name] Other Clinician: Referring Loretha Ure: Chad [name] Treating Jonell Krontz/Extender: Chad [name]: 30 Active Problems Location of Pain Severity and Description of Pain Patient Has Paino No Site Locations Rate the pain. Current Pain Level:  0 Pain Management and Medication Current Pain Management: Electronic Signature(s) Signed: 03/18/2021 4:26:23 PM By: Chad Amen RN Entered By: Chad [name] on 03/18/2021 08:23:59 Chad [name] ([id number]) -------------------------------------------------------------------------------- Patient/Caregiver Education Details Patient Name: Chad [name] Date of Service: 03/18/2021 8:15 AM Medical Record Number: [id number] Patient Account Number: [id number] Date of Birth/Gender: 04/14/1961 (60 y.o. M) Treating RN: Chad [name] Primary Care Physician: Chad [name] Other Clinician: Referring Physician: Clayborn [name] Treating Physician/Extender: Chad [name]: 30 Education Assessment Education Provided To: Patient Education Topics Provided Wound/Skin Impairment: Methods: Explain/Verbal Responses: State content  correctly Electronic Signature(s) Signed: 03/18/2021 4:26:23 PM By: Chad Amen RN Entered By: Chad [name] on 03/18/2021 08:50:13 Chad [name] ([id number]) -------------------------------------------------------------------------------- Wound Assessment Details Patient Name: Chad [name] Date of Service: 03/18/2021 8:15 AM Medical Record Number: [id number] Patient Account Number: [id number] Date of Birth/Sex: 1960-10-27 (60 y.o. M) Treating RN: Chad [name] Primary Care Meric Joye: Chad [name] Other Clinician: Referring Gaven Eugene: Chad [name] Treating Omir Cooprider/Extender: Jeri Cos Weeks in [name]: 30 Wound Status Wound Number: 8 Primary Venous Leg Ulcer Etiology: Wound Location: Left, Medial Ankle Wound Open Wounding Event: Gradually Appeared Status: Date Acquired: 07/12/2019 Comorbid Anemia, Chronic Obstructive Pulmonary Disease (COPD), Weeks Of [name]: 30 History: Hepatitis C, Received Chemotherapy Clustered Wound: No Photos Wound Measurements Length: (cm) 2.2 Width: (cm) 1.6 Depth: (cm) 0.1 Area: (cm) 2.765 Volume: (cm) 0.276 % Reduction in Area: 53.1% % Reduction in Volume: 53.1% Epithelialization: Medium (34-66%) Tunneling: No Undermining: No Wound Description Classification: Full Thickness Without Exposed Support Structu Wound Margin: Flat and Intact Exudate Amount: Medium Exudate Type: Serosanguineous Exudate Color: red, brown res Foul Odor After Cleansing: No Slough/Fibrino Yes Wound Bed Granulation Amount: Medium (34-66%) Exposed Structure Granulation Quality: Pink Fascia Exposed: No Necrotic Amount: Medium (34-66%) Fat Layer (Subcutaneous Tissue) Exposed: Yes Necrotic Quality: Adherent Slough Tendon Exposed: No Muscle Exposed: No Joint Exposed: No Bone Exposed: No Assessment Notes irritation noted above wound [name] Notes Wound #8 (Ankle) Wound Laterality: Left, Medial Cleanser Normal Saline GAVAN, LOUGHEED  ([id number]) Discharge Instruction: Wash your hands with soap and water. Remove old dressing, discard into plastic bag and place into trash. Cleanse the wound with Normal Saline prior to applying a clean dressing using gauze sponges, not tissues or cotton balls. Do not scrub or use excessive force. Pat dry using gauze sponges, not tissue or cotton balls. Peri-Wound Care Desitin Maximum Strength Ointment 4 (oz) Discharge Instruction: Apply zinc periwound Topical Primary Dressing Prisma 4.34 (in) Discharge Instruction: Moisten w/normal saline or sterile water; Cover wound bed ONLY. Do not remove from wound bed. Secondary Dressing ABD Pad 5x9 (in/in) Discharge Instruction: Cover with ABD pad Kerlix 4.5 x 4.1 (in/yd) Discharge Instruction: Apply kerlix over ABD pad Secured With [place] Surgical Tape, 2x2 (in/yd) Discharge Instruction: Secure kerlix Compression Wrap Compression Stockings Add-Ons Electronic Signature(s) Signed: 03/18/2021 4:26:23 PM By: Chad Amen RN Entered By: Chad [name] on 03/18/2021 08:30:13 Chad [name] ([id number]) -------------------------------------------------------------------------------- [place] Details Patient Name: Chad [name] Date of Service: 03/18/2021 8:15 AM Medical Record Number: [id number] Patient Account Number: [id number] Date of Birth/Sex: 01-03-1961 (60 y.o. M) Treating RN: Chad [name] Primary Care Atasha Colebank: Chad [name] Other Clinician: Referring Karron Goens: Chad [name] Treating Jolynne Spurgin/Extender: Chad [name]: 30 Vital Signs Time Taken: 08:22 Temperature (F): 98.7 Height (in): 67 Pulse (bpm): 81 Weight (lbs): 160 Respiratory Rate (breaths/min): 18 Body Mass Index (BMI): 25.1 Blood Pressure (mmHg): 112/73 Reference Range: 80 - 120 mg / dl Electronic Signature(s) Signed: 03/18/2021  4:26:23 PM By: Chad Amen RN Entered By: Chad [name] on 03/18/2021 08:23:30

## 2021-03-18 NOTE — Progress Notes (Addendum)
Chad [name], Chad [name] ([id number]) Visit Report for 03/18/2021 Chief Complaint Document Details Patient Name: Chad [name], Chad [name] Date of Service: 03/18/2021 8:15 AM Medical Record Number: [id number] Patient Account Number: [id number] Date of Birth/Sex: July 27, 1961 (60 y.o. M) Treating RN: Dolan Amen Primary Care Provider: Clayborn Bigness Other Clinician: Referring Provider: Clayborn Bigness Treating Provider/Extender: Skipper Cliche in Treatment: 30 Information Obtained from: Patient Chief Complaint Left ankle and right foot and heel ulcers Electronic Signature(s) Signed: 03/18/2021 8:32:34 AM By: Worthy Keeler PA-C Entered By: Worthy Keeler on 03/18/2021 08:32:34 Chad [name] ([id number]) -------------------------------------------------------------------------------- Debridement Details Patient Name: Chad [name] Date of Service: 03/18/2021 8:15 AM Medical Record Number: [id number] Patient Account Number: [id number] Date of Birth/Sex: 08-Apr-1961 (60 y.o. M) Treating RN: Dolan Amen Primary Care Provider: Clayborn Bigness Other Clinician: Referring Provider: Clayborn Bigness Treating Provider/Extender: Skipper Cliche in Treatment: 30 Debridement Performed for Wound #8 Left,Medial Ankle Assessment: Performed By: Physician Tommie Sams., PA-C Debridement Type: Debridement Severity of Tissue Pre Debridement: Fat layer exposed Level of Consciousness (Pre- Awake and Alert procedure): Pre-procedure Verification/Time Out Yes - 08:41 Taken: Start Time: 08:41 Total Area Debrided (L x W): 2.2 (cm) x 1.6 (cm) = 3.52 (cm) Tissue and other material Viable, Non-Viable, Slough, Subcutaneous, Slough debrided: Level: Skin/Subcutaneous Tissue Debridement Description: Excisional Instrument: Curette Bleeding: Moderate Hemostasis Achieved: Silver Nitrate Response to Treatment: Procedure was tolerated well Level of Consciousness (Post- Awake and Alert procedure): Post Debridement Measurements  of Total Wound Length: (cm) 2.2 Width: (cm) 1.6 Depth: (cm) 0.2 Volume: (cm) 0.553 Character of Wound/Ulcer Post Debridement: Stable Severity of Tissue Post Debridement: Fat layer exposed Post Procedure Diagnosis Same as Pre-procedure Notes 1 silver nitrate stick used Electronic Signature(s) Signed: 03/18/2021 4:26:23 PM By: Dolan Amen RN Signed: 03/18/2021 5:15:29 PM By: Worthy Keeler PA-C Entered By: Dolan Amen on 03/18/2021 08:50:40 Chad [name] ([id number]) -------------------------------------------------------------------------------- HPI Details Patient Name: Chad [name] Date of Service: 03/18/2021 8:15 AM Medical Record Number: [id number] Patient Account Number: [id number] Date of Birth/Sex: 1961-01-11 (60 y.o. M) Treating RN: Dolan Amen Primary Care Provider: Clayborn Bigness Other Clinician: Referring Provider: Clayborn Bigness Treating Provider/Extender: Skipper Cliche in Treatment: 30 History of Present Illness HPI Description: 10/08/18 on evaluation today patient actually presents to our office for initial evaluation concerning wounds that he has of the bilateral lower extremities. 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 He also has bilateral venous stasis. He apparently has been homeless currently following discharge from the hospital apparently he has been placed at [facility] which is is a skilled nursing facility locally. Nonetheless fortunately he does not show any signs of infection at this time which is good news. In fact several of the wound actually appears to be showing some signs of improvement already in my pinion. There are a couple areas in the left leg in particular there likely gonna require some sharp debridement to help clear away some necrotic tissue and help with more  sufficient healing. No fevers, chills, nausea, or vomiting noted at this time. 10/15/18 on evaluation today patient actually appears to be doing very well in regard to his bilateral lower extremities. He's been tolerating the dressing changes without complication. Fortunately there does not appear to be any evidence of active infection at this time which is great news. Overall I'm actually very pleased with how this has progressed in just  one visits time. Readmission: 08/14/2020 upon evaluation today patient presents for re-evaluation here in our clinic. He is having issues with his left ankle region as well as his right toe and his right heel. He tells me that the toe and heel actually began as a area that was itching that he was scratching and then subsequently opened up into wounds. These may have been abscess areas I presume based on what I am seeing currently. With regard to his left ankle region he tells me this was a similar type occurrence although he does have venous stasis this very well may be more of a venous leg ulcer more than anything. Nonetheless I do believe that the patient would benefit from appropriate and aggressive wound care to try to help get things under better control here. He does have history of a stroke on the left side affecting him to some degree there that he is able to stand although he does have some residual weakness. Otherwise again the patient does have chronic venous insufficiency as previously noted. His arterial studies most recently obtained showed that he had an ABI on the right of 1.16 with a TBI of 0.52 and on the left and ABI of 1.14 with a TBI of 0.81. That was obtained on 06/19/2020. 08/28/2020 upon evaluation today patient appears to be doing decently well in regard to his wounds in general. He has been tolerating the dressing changes without complication. Fortunately there does not appear to be any signs of active infection which is great news. With that  being said I think the [facility] is doing a good job I would recommend that we likely continue with that currently. 09/11/2020 upon evaluation today patient's wounds did not appear to be doing too poorly but again he is not really showing signs of significant improvement with regard to any of the wounds on the right. None of them have Hydrofera Blue on them I am not exactly sure why this is not being followed as the facility did not contact us to let us know of any issues with obtaining dressings or otherwise. With that being said he is supposed to be using Hydrofera Blue on both of the wounds on the right foot as well as the ankle wound on the left side. 09/18/2020 upon evaluation today patient appears to be doing poorly with regard to his wounds. Again right now the left ankle in particular showed signs of extreme maceration. Apparently he was told by someone with staff at [facility] they could not get the [facility]. With that being said this is something that is never been relayed to [place] one way or another. Also the patient subsequently has not supposed to have a border gauze dressing on. He should have an ABD pad and roll gauze to secure as this drains much too much just to have a border gauze dressing to cover. Nonetheless the fact that they are not using the appropriate dressing is directly causing deterioration of the left ankle wound it is significantly worse today compared to what it was previous. I did attempt to call [facility] while the patient was here I called three times and got no one to even pick up the phone. After this I had my for an office coordinator call and she was able to finally get through and leave a message with the D ON as of dictation of this note which is roughly about an hour and a half later I still have not been  able to speak with anyone at the facility. 09/25/2020 upon evaluation today patient actually showing signs of good improvement which  is excellent news. He has been tolerating the dressing changes without complication. Fortunately there is no signs of active infection which is great news. No fevers, chills, nausea, vomiting, or diarrhea. I do feel like the facility has been doing a much better job at taking care of him as far as the dressings are concerned. However the director of nursing never did call me back. 10/09/2020 upon evaluation today patient appears to be doing well with regard to his wound. The toe ulcer did require some debridement but the other 2 areas actually appear to be doing quite well. 10/19/2020 upon evaluation today patient actually appears to be doing very well in regard to his wounds. In fact the heel does appear to be completely healed. The toe is doing better in the medial ankle on the left is also doing better. Overall I think he is headed in the right direction. 10/26/2020 upon evaluation today patient appears to be doing well with regard to his wound. He is showing signs of improvement which is great news and overall I am very pleased with where things stand today. No fevers, chills, nausea, vomiting, or diarrhea. 11/02/2020 upon evaluation today patient appears to be doing well with regard to his wounds. He has been tolerating the dressing changes without complication overall I am extremely pleased with where things stand today. He in regard to the toe is almost completely healed and the medial ankle on the left is doing much better. 11/09/2020 upon evaluation today patient appears to be doing a little poorly in regard to his left medial ankle ulcer. Fortunately there does not appear to be any signs of systemic infection but unfortunately locally he does appear to be infected in fact he has blue-green drainage consistent with Pseudomonas. Chad [name], Chad [name] ([id number]) 11/16/2020 upon evaluation today patient appears to be doing well with regard to his wound. It actually appears to be doing better. I did place  him on gentamicin cream since the Cipro was actually resistant even though he was positive for Pseudomonas on culture. Overall I think that he does seem to be doing better though I am unsure whether or not they have actually been putting the cream on. The patient is not sure that we did talk to the nurse directly and she was going to initiate that treatment. Fortunately there does not appear to be any signs of active infection at this time. No fevers, chills, nausea, vomiting, or diarrhea. 4/28; the area on the right second toe is close to healed. Left medial ankle required debridement 12/07/2020 upon evaluation today patient appears to be doing well with regard to his wounds. In fact the right second toe appears to be completely healed which is great news. Fortunately there does not appear to be any signs of active infection at this time which is also great news. I think we can probably discontinue the gentamicin on top of everything else. 12/14/2020 upon evaluation today patient appears to be doing well with regard to his wound. He is making good progress and overall very pleased with where things stand today. There is no signs of active infection at this time which is great news. 12/28/2020 upon evaluation today patient appears to be doing well with regard to his wounds. He has been tolerating the dressing changes without complication. Fortunately there is no signs of active infection at this time. No fevers, chills,  nausea, vomiting, or diarrhea. 12/28/2020 upon evaluation today patient's wound bed actually showed signs of excellent improvement. He has great epithelization and granulation I do not see any signs of infection overall I am extremely pleased with where things stand at this point. No fevers, chills, nausea, vomiting, or diarrhea. 01/11/2021 upon evaluation today patient appears to be doing well with regard to his wound on his leg. He has been tolerating the dressing changes without  complication. Fortunately there does not appear to be any signs of active infection which is great news. No fevers, chills, nausea, vomiting, or diarrhea. 01/25/2021 upon evaluation today patient appears to be doing well with regard to his wound. He has been tolerating the dressing changes without complication. Fortunately the collagen seems to be doing a great job which is excellent news. No fevers, chills, nausea, vomiting, or diarrhea. 02/08/2021 upon evaluation today patient's wound is actually looking a little bit worse especially in the periwound compared to previous. Fortunately there does not appear to be any signs of infection which is great news with that being said he does have some irritation around the periphery of the wound which has me more concerned. He actually had a dressing on that had not been changed in 3 days. He also is supposed to have daily dressing changes. With regard to the dressing applied he had a silver alginate dressing and silver collagen is what is recommended and ordered. He also had no Desitin around the edges of the wound in the periwound region although that is on the order inspect to be done as well. In general I was very concerned I did contact [facility] actually spoke with Magda Paganini who is the scheduling individual and subsequently she stated that she would pass the information to the D ON apparently the D ON was not available to talk to me when I call today. 02/18/2021 upon evaluation today patient's wound is actually showing signs of improvement. Fortunately there does not appear to be any evidence of infection which is great news overall I am extremely pleased with where things stand today. No fevers, chills, nausea, vomiting, or diarrhea. 8/3; patient presents for 1 week follow-up. He has no issues or complaints today. He denies signs of infection. 03/11/2021 upon evaluation today patient appears to be doing well with regard to his wound. He does have a  little bit of slough noted on the surface of the wound but fortunately there does not appear to be any signs of active infection at this time. No fevers, chills, nausea, vomiting, or diarrhea. 03/18/2021 upon evaluation today patient appears to be doing well with regard to his wound. He has been tolerating the dressing changes without complication. There was a little irritation more proximal to where the wound was that was not noted last week but nonetheless this is very superficial just seems to be more irritation we just need to make sure to put a good amount of the zinc over the area in my opinion. Otherwise he does not seem to be doing significantly worse at all which is great news. Electronic Signature(s) Signed: 03/18/2021 8:56:16 AM By: Worthy Keeler PA-C Entered By: Worthy Keeler on 03/18/2021 08:56:16 Chad [name], Chad [name] ([id number]) -------------------------------------------------------------------------------- Physical Exam Details Patient Name: Chad [name] Date of Service: 03/18/2021 8:15 AM Medical Record Number: [id number] Patient Account Number: [id number] Date of Birth/Sex: 09/06/1960 (60 y.o. M) Treating RN: Dolan Amen Primary Care Provider: Clayborn Bigness Other Clinician: Referring Provider: Clayborn Bigness Treating Provider/Extender: Jeri Cos Weeks in  Treatment: 92 Constitutional Well-nourished and well-hydrated in no acute distress. Respiratory normal breathing without difficulty. Psychiatric this patient is able to make decisions and demonstrates good insight into disease process. Alert and Oriented x 3. pleasant and cooperative. Notes Upon inspection patient's wound bed did require sharp debridement. He did have some somewhat poor granulation tissue noted at this point which was addressed today as well by way of debridement. Post debridement the wound bed appears to be better quality but again it did bleed a little bit we had to use a little silver nitrate to  achieve hemostasis. Electronic Signature(s) Signed: 03/18/2021 8:58:06 AM By: Worthy Keeler PA-C Entered By: Worthy Keeler on 03/18/2021 08:58:06 Chad [name] ([id number]) -------------------------------------------------------------------------------- Physician Orders Details Patient Name: Chad [name] Date of Service: 03/18/2021 8:15 AM Medical Record Number: [id number] Patient Account Number: [id number] Date of Birth/Sex: 07/20/1961 (60 y.o. 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 Bathing/ Shower/ Hygiene o May shower; gently cleanse wound with antibacterial soap, rinse and pat dry prior to dressing wounds Wound Treatment Wound #8 - Ankle Wound Laterality: Left, Medial Cleanser: Normal Saline 1 x Per Day/30 Days Discharge Instructions: Wash your hands with soap and water. Remove old dressing, discard into plastic bag and place into trash. Cleanse the wound with Normal Saline prior to applying a clean dressing using gauze sponges, not tissues or cotton balls. Do not scrub or use excessive force. Pat dry using gauze sponges, not tissue or cotton balls. Peri-Wound Care: Desitin Maximum Strength Ointment 4 (oz) 1 x Per Day/30 Days Discharge Instructions: Apply zinc periwound Primary Dressing: Prisma 4.34 (in) 1 x Per Day/30  Days Discharge Instructions: Moisten w/normal saline or sterile water; Cover wound bed ONLY. Do not remove from wound bed. Secondary Dressing: ABD Pad 5x9 (in/in) 1 x Per Day/30 Days Discharge Instructions: Cover with ABD pad Secondary Dressing: Kerlix 4.5 x 4.1 (in/yd) 1 x Per Day/30 Days Discharge Instructions: Apply kerlix over ABD pad Secured With: 19M Medipore H Soft Cloth Surgical Tape, 2x2 (in/yd) 1 x Per Day/30 Days Discharge Instructions: Secure kerlix Notes Silver nitrate used in office, wound may have greyish appearance Electronic Signature(s) Signed: 03/18/2021 4:26:23 PM By: Dolan Amen RN Signed: 03/18/2021 5:15:29 PM By: Worthy Keeler PA-C Entered By: Dolan Amen on 03/18/2021 08:50:03 Chad [name] ([id number]) -------------------------------------------------------------------------------- Problem List Details Patient Name: Chad [name] Date of Service: 03/18/2021 8:15 AM Medical Record Number: [id number] Patient Account Number: [id number] Date of Birth/Sex: 1961/05/04 (60 y.o. M) Treating RN: Dolan Amen Primary Care Provider: Clayborn Bigness Other Clinician: Referring Provider: Clayborn Bigness Treating Provider/Extender: Skipper Cliche in Treatment: 30 Active Problems ICD-10 Encounter Code Description Active Date MDM Diagnosis I87.2 Venous insufficiency (chronic) (peripheral) 08/14/2020 No Yes L97.322 Non-pressure chronic ulcer of left ankle with fat layer exposed 08/14/2020 No Yes L97.412 Non-pressure chronic ulcer of right heel and midfoot with fat layer 08/14/2020 No Yes exposed L97.512 Non-pressure chronic ulcer of other part of right foot with fat layer  08/14/2020 No Yes exposed 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 M) Treating RN: Dolan Amen Primary Care Provider: Clayborn Bigness Other Clinician: Referring Provider: Clayborn Bigness Treating Provider/Extender: Skipper Cliche in Treatment: 30 Subjective Chief Complaint Information obtained from Patient Left ankle and right foot and heel ulcers History of Present Illness (HPI) 10/08/18 on evaluation today patient actually presents to our office for initial evaluation concerning wounds that he has of the bilateral lower extremities. 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 He also has bilateral venous stasis. He apparently has been homeless currently following discharge from the hospital apparently he has been placed at [facility] which is is a skilled nursing facility locally. Nonetheless fortunately he does not show any signs of infection at this time which is good news. In fact several of the wound actually appears to be showing some signs of improvement already in my pinion. There are a couple areas in the left leg in particular there likely gonna require some sharp debridement to help clear away some necrotic tissue and help with more sufficient healing. No fevers, chills, nausea, or vomiting noted at this time. 10/15/18 on evaluation today patient actually appears to be doing very well in regard to his bilateral lower extremities. He's been tolerating the dressing changes without complication. Fortunately there does not appear to be any evidence of active infection at this time  which is great news. Overall I'm actually very pleased with how this has progressed in just one visits time. Readmission: 08/14/2020 upon evaluation today patient presents for re-evaluation here in our clinic. He is having issues with his left ankle region as well as his right toe and his right heel. He tells me that the toe and heel actually began as a area that was itching that he was scratching and then subsequently opened up into wounds. These may have been abscess areas I presume based on what I am seeing currently. With regard to his left ankle region he tells me this was a similar type occurrence although he does have venous stasis this very well may be more of a venous leg ulcer more than anything. Nonetheless I do believe that the patient would benefit from appropriate and aggressive wound care to try to help get things under better control here. He does have history of a stroke on the left side affecting him to some degree there that he is able to stand although he does have some residual weakness. Otherwise again the patient does have chronic venous insufficiency as previously noted. His arterial studies most recently obtained showed that he had an ABI on the right of 1.16 with a TBI of 0.52 and on the left and ABI of 1.14 with a TBI of 0.81. That was obtained on 06/19/2020. 08/28/2020 upon evaluation today patient appears to be doing decently well in regard to his wounds in general. He has been tolerating the dressing changes without complication. Fortunately there does not appear to be any signs of active infection  which is great news. With that being said I think the [facility] is doing a good job I would recommend that we likely continue with that currently. 09/11/2020 upon evaluation today patient's wounds did not appear to be doing too poorly but again he is not really showing signs of significant improvement with regard to any of the wounds on the right. None of them have Hydrofera  Blue on them I am not exactly sure why this is not being followed as the facility did not contact us to let us know of any issues with obtaining dressings or otherwise. With that being said he is supposed to be using Hydrofera Blue on both of the wounds on the right foot as well as the ankle wound on the left side. 09/18/2020 upon evaluation today patient appears to be doing poorly with regard to his wounds. Again right now the left ankle in particular showed signs of extreme maceration. Apparently he was told by someone with staff at [place] they could not get the [facility]. With that being said this is something that is never been relayed to [place] one way or another. Also the patient subsequently has not supposed to have a border gauze dressing on. He should have an ABD pad and roll gauze to secure as this drains much too much just to have a border gauze dressing to cover. Nonetheless the fact that they are not using the appropriate dressing is directly causing deterioration of the left ankle wound it is significantly worse today compared to what it was previous. I did attempt to call  healthcare while the patient was here I called three times and got no one to even pick up the phone. After this I had my for an office coordinator call and she was able to finally get through and leave a message with the D ON as of dictation of this note which is roughly about an hour and a half later I still have not been able to speak with anyone at the facility. 09/25/2020 upon evaluation today patient actually showing signs of good improvement which is excellent news. He has been tolerating the dressing changes without complication. Fortunately there is no signs of active infection which is great news. No fevers, chills, nausea, vomiting, or diarrhea. I do feel like the facility has been doing a much better job at taking care of him as far as the dressings are concerned. However the director of  nursing never did call me back. 10/09/2020 upon evaluation today patient appears to be doing well with regard to his wound. The toe ulcer did require some debridement but the other 2 areas actually appear to be doing quite well. 10/19/2020 upon evaluation today patient actually appears to be doing very well in regard to his wounds. In fact the heel does appear to be completely healed. The toe is doing better in the medial ankle on the left is also doing better. Overall I think he is headed in the right direction. 10/26/2020 upon evaluation today patient appears to be doing well with regard to his wound. He is showing signs of improvement which is great news and overall I am very pleased with where things stand today. No fevers, chills, nausea, vomiting, or diarrhea. 11/02/2020 upon evaluation today patient appears to be doing well with regard to his wounds. He has been tolerating the dressing changes without complication overall I am extremely pleased with where things stand today. He in regard to the toe is almost  completely healed and the medial [facility] [place] ([id number]) ankle on the left is doing much better. 11/09/2020 upon evaluation today patient appears to be doing a little poorly in regard to his left medial ankle ulcer. Fortunately there does not appear to be any signs of systemic infection but unfortunately locally he does appear to be infected in fact he has blue-green drainage consistent with Pseudomonas. 11/16/2020 upon evaluation today patient appears to be doing well with regard to his wound. It actually appears to be doing better. I did place him on gentamicin cream since the Cipro was actually resistant even though he was positive for Pseudomonas on culture. Overall I think that he does seem to be doing better though I am unsure whether or not they have actually been putting the cream on. The patient is not sure that we did talk to the nurse directly and she was going to initiate that  treatment. Fortunately there does not appear to be any signs of active infection at this time. No fevers, chills, nausea, vomiting, or diarrhea. 4/28; the area on the right second toe is close to healed. Left medial ankle required debridement 12/07/2020 upon evaluation today patient appears to be doing well with regard to his wounds. In fact the right second toe appears to be completely healed which is great news. Fortunately there does not appear to be any signs of active infection at this time which is also great news. I think we can probably discontinue the gentamicin on top of everything else. 12/14/2020 upon evaluation today patient appears to be doing well with regard to his wound. He is making good progress and overall very pleased with where things stand today. There is no signs of active infection at this time which is great news. 12/28/2020 upon evaluation today patient appears to be doing well with regard to his wounds. He has been tolerating the dressing changes without complication. Fortunately there is no signs of active infection at this time. No fevers, chills, nausea, vomiting, or diarrhea. 12/28/2020 upon evaluation today patient's wound bed actually showed signs of excellent improvement. He has great epithelization and granulation I do not see any signs of infection overall I am extremely pleased with where things stand at this point. No fevers, chills, nausea, vomiting, or diarrhea. 01/11/2021 upon evaluation today patient appears to be doing well with regard to his wound on his leg. He has been tolerating the dressing changes without complication. Fortunately there does not appear to be any signs of active infection which is great news. No fevers, chills, nausea, vomiting, or diarrhea. 01/25/2021 upon evaluation today patient appears to be doing well with regard to his wound. He has been tolerating the dressing changes without complication. Fortunately the collagen seems to be doing a  great job which is excellent news. No fevers, chills, nausea, vomiting, or diarrhea. 02/08/2021 upon evaluation today patient's wound is actually looking a little bit worse especially in the periwound compared to previous. Fortunately there does not appear to be any signs of infection which is great news with that being said he does have some irritation around the periphery of the wound which has me more concerned. He actually had a dressing on that had not been changed in 3 days. He also is supposed to have daily dressing changes. With regard to the dressing applied he had a silver alginate dressing and silver collagen is what is recommended and ordered. He also had no Desitin around the edges of the wound in the periwound  region although that is on the order inspect to be done as well. In general I was very concerned I did contact [facility] actually spoke with Magda Paganini who is the scheduling individual and subsequently she stated that she would pass the information to the D ON apparently the D ON was not available to talk to me when I call today. 02/18/2021 upon evaluation today patient's wound is actually showing signs of improvement. Fortunately there does not appear to be any evidence of infection which is great news overall I am extremely pleased with where things stand today. No fevers, chills, nausea, vomiting, or diarrhea. 8/3; patient presents for 1 week follow-up. He has no issues or complaints today. He denies signs of infection. 03/11/2021 upon evaluation today patient appears to be doing well with regard to his wound. He does have a little bit of slough noted on the surface of the wound but fortunately there does not appear to be any signs of active infection at this time. No fevers, chills, nausea, vomiting, or diarrhea. 03/18/2021 upon evaluation today patient appears to be doing well with regard to his wound. He has been tolerating the dressing changes without complication. There  was a little irritation more proximal to where the wound was that was not noted last week but nonetheless this is very superficial just seems to be more irritation we just need to make sure to put a good amount of the zinc over the area in my opinion. Otherwise he does not seem to be doing significantly worse at all which is great news. Objective Constitutional Well-nourished and well-hydrated in no acute distress. Vitals Time Taken: 8:22 AM, Height: 67 in, Weight: 160 lbs, BMI: 25.1, Temperature: 98.7 F, Pulse: 81 bpm, Respiratory Rate: 18 breaths/min, Blood Pressure: 112/73 mmHg. Respiratory normal breathing without difficulty. Psychiatric this patient is able to make decisions and demonstrates good insight into disease process. Alert and Oriented x 3. pleasant and cooperative. Chad [name], Chad [name] ([id number]) General Notes: Upon inspection patient's wound bed did require sharp debridement. He did have some somewhat poor granulation tissue noted at this point which was addressed today as well by way of debridement. Post debridement the wound bed appears to be better quality but again it did bleed a little bit we had to use a little silver nitrate to achieve hemostasis. Integumentary (Hair, Skin) Wound #8 status is Open. Original cause of wound was Gradually Appeared. The date acquired was: 07/12/2019. The wound has been in treatment 30 weeks. The wound is located on the Left,Medial Ankle. The wound measures 2.2cm length x 1.6cm width x 0.1cm depth; 2.765cm^2 area and 0.276cm^3 volume. There is Fat Layer (Subcutaneous Tissue) exposed. There is no tunneling or undermining noted. There is a medium amount of serosanguineous drainage noted. The wound margin is flat and intact. There is medium (34-66%) pink granulation within the wound bed. There is a medium (34-66%) amount of necrotic tissue within the wound bed including Adherent Slough. 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 There was a Excisional Skin/Subcutaneous Tissue Debridement  with a total area of 3.52 sq cm performed by Tommie Sams., PA-C. With the following instrument(s): Curette to remove Viable and Non-Viable tissue/material. Material removed includes Subcutaneous Tissue and Slough and. A time out was conducted at 08:41, prior to the start of the procedure. A Moderate amount of bleeding was controlled with Silver Nitrate. The procedure was tolerated well. Post Debridement Measurements: 2.2cm length x 1.6cm width x 0.2cm depth; 0.553cm^3 volume. Character of Wound/Ulcer Post Debridement is stable. Severity of Tissue Post Debridement is: Fat layer exposed. Post procedure Diagnosis Wound #8: Same as Pre-Procedure General Notes: 1 silver nitrate stick used. Plan Follow-up Appointments: Return Appointment in 1 week. Bathing/ Shower/ Hygiene: May shower; gently cleanse wound with antibacterial soap, rinse and pat dry prior to dressing wounds General Notes: Silver nitrate used in office, wound may have greyish appearance WOUND #8: - Ankle Wound Laterality: Left, Medial Cleanser: Normal Saline 1 x Per Day/30 Days Discharge Instructions: Wash your hands with soap and water. Remove old dressing, discard into plastic bag and place into trash. Cleanse the wound with Normal Saline prior to applying a clean dressing using gauze sponges, not tissues or cotton  balls. Do not scrub or use excessive force. Pat dry using gauze sponges, not tissue or cotton balls. Peri-Wound Care: Desitin Maximum Strength Ointment 4 (oz) 1 x Per Day/30 Days Discharge Instructions: Apply zinc periwound Primary Dressing: Prisma 4.34 (in) 1 x Per Day/30 Days Discharge Instructions: Moisten w/normal saline or sterile water; Cover wound bed ONLY. Do not remove from wound bed. Secondary Dressing: ABD Pad 5x9 (in/in) 1 x Per Day/30 Days Discharge Instructions: Cover with ABD pad Secondary Dressing: Kerlix 4.5 x 4.1 (in/yd) 1 x Per Day/30 Days Discharge Instructions: Apply kerlix over ABD pad Secured With: 72M Medipore H Soft Cloth Surgical Tape, 2x2 (in/yd) 1 x Per Day/30 Days Discharge Instructions: Secure Chad [name], Chad [name] ([id number]) 1. Would recommend currently that we going continue with the wound care measures as before and the patient is in agreement with the plan this includes the use of the silver collagen which I think is still a good option. 2. We will cover this with zinc around the periwound to protect the skin followed by an ABD pad and roll gauze to secure. 3. I am also can recommend the patient should continue to monitor for any signs of worsening if anything changes he should let me know. We will see patient back for reevaluation in 1 week here in the clinic. If anything worsens or changes patient will contact our office for additional recommendations. Electronic Signature(s) Signed: 03/18/2021 8:58:36 AM By: Worthy Keeler PA-C Entered By: Worthy Keeler on 03/18/2021 08:58:36 Chad [name] ([id number]) -------------------------------------------------------------------------------- SuperBill Details Patient Name: Chad [name] Date of Service: 03/18/2021 Medical Record Number: [id number] Patient Account Number: [id number] Date of Birth/Sex: 03-15-1961 (60 y.o. M) Treating RN: Dolan Amen Primary Care Provider: Clayborn Bigness Other  Clinician: Referring Provider: Clayborn Bigness Treating Provider/Extender: Skipper Cliche in Treatment: 30 Diagnosis Coding ICD-10 Codes Code Description I87.2 Venous insufficiency (chronic) (peripheral) L97.322 Non-pressure chronic ulcer of left ankle with fat layer exposed L97.412 Non-pressure chronic ulcer of right heel and midfoot with fat layer exposed L97.512 Non-pressure chronic ulcer of other part of right foot with fat layer exposed 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 side Facility Procedures CPT4 Code: [id number] Description: 11042 - DEB SUBQ TISSUE 20 SQ CM/< Modifier: Quantity: 1 CPT4 Code: Description: ICD-10 Diagnosis Description L97.322 Non-pressure chronic ulcer of  left ankle with fat layer exposed Modifier: Quantity: Physician Procedures CPT4 Code: [id number] Description: [id number] - WC PHYS SUBQ TISS 20 SQ CM Modifier: Quantity: 1 CPT4 Code: Description: ICD-10 Diagnosis Description [id number] Non-pressure chronic ulcer of left ankle with fat layer exposed Modifier: Quantity: Electronic Signature(s) Signed: 03/18/2021 8:59:29 AM By: Worthy Keeler PA-C Entered By: Worthy Keeler on 03/18/2021 08:59:28

## 2021-03-20 ENCOUNTER — Inpatient Hospital Stay: Payer: Medicaid Other

## 2021-03-20 ENCOUNTER — Inpatient Hospital Stay: Payer: Medicaid Other | Attending: Internal Medicine

## 2021-03-20 ENCOUNTER — Inpatient Hospital Stay (HOSPITAL_BASED_OUTPATIENT_CLINIC_OR_DEPARTMENT_OTHER): Payer: Medicaid Other | Admitting: Internal Medicine

## 2021-03-20 ENCOUNTER — Encounter: Payer: Self-pay | Admitting: Internal Medicine

## 2021-03-20 DIAGNOSIS — Z8546 Personal history of malignant neoplasm of prostate: Secondary | ICD-10-CM | POA: Insufficient documentation

## 2021-03-20 DIAGNOSIS — C661 Malignant neoplasm of right ureter: Secondary | ICD-10-CM

## 2021-03-20 DIAGNOSIS — E785 Hyperlipidemia, unspecified: Secondary | ICD-10-CM | POA: Diagnosis not present

## 2021-03-20 DIAGNOSIS — Z7982 Long term (current) use of aspirin: Secondary | ICD-10-CM | POA: Insufficient documentation

## 2021-03-20 DIAGNOSIS — D509 Iron deficiency anemia, unspecified: Secondary | ICD-10-CM | POA: Insufficient documentation

## 2021-03-20 DIAGNOSIS — J449 Chronic obstructive pulmonary disease, unspecified: Secondary | ICD-10-CM | POA: Diagnosis not present

## 2021-03-20 DIAGNOSIS — C187 Malignant neoplasm of sigmoid colon: Secondary | ICD-10-CM | POA: Diagnosis not present

## 2021-03-20 DIAGNOSIS — Z8673 Personal history of transient ischemic attack (TIA), and cerebral infarction without residual deficits: Secondary | ICD-10-CM | POA: Insufficient documentation

## 2021-03-20 DIAGNOSIS — C182 Malignant neoplasm of ascending colon: Secondary | ICD-10-CM

## 2021-03-20 DIAGNOSIS — Z7189 Other specified counseling: Secondary | ICD-10-CM

## 2021-03-20 DIAGNOSIS — Z1509 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: Secondary | ICD-10-CM | POA: Diagnosis not present

## 2021-03-20 DIAGNOSIS — Z5112 Encounter for antineoplastic immunotherapy: Secondary | ICD-10-CM | POA: Insufficient documentation

## 2021-03-20 DIAGNOSIS — Z79899 Other long term (current) drug therapy: Secondary | ICD-10-CM | POA: Insufficient documentation

## 2021-03-20 DIAGNOSIS — Z515 Encounter for palliative care: Secondary | ICD-10-CM

## 2021-03-20 DIAGNOSIS — Z95828 Presence of other vascular implants and grafts: Secondary | ICD-10-CM

## 2021-03-20 LAB — COMPREHENSIVE METABOLIC PANEL
ALT: 17 U/L (ref 0–44)
AST: 30 U/L (ref 15–41)
Albumin: 3.7 g/dL (ref 3.5–5.0)
Alkaline Phosphatase: 109 U/L (ref 38–126)
Anion gap: 7 (ref 5–15)
BUN: 23 mg/dL (ref 8–23)
CO2: 25 mmol/L (ref 22–32)
Calcium: 8.5 mg/dL — ABNORMAL LOW (ref 8.9–10.3)
Chloride: 102 mmol/L (ref 98–111)
Creatinine, Ser: 0.96 mg/dL (ref 0.61–1.24)
GFR, Estimated: >60 mL/min (ref >60)
Glucose, Bld: 95 mg/dL (ref 70–99)
Potassium: 4.3 mmol/L (ref 3.5–5.1)
Sodium: 134 mmol/L — ABNORMAL LOW (ref 135–145)
Total Bilirubin: 0.4 mg/dL (ref 0.3–1.2)
Total Protein: 8.3 g/dL — ABNORMAL HIGH (ref 6.5–8.1)

## 2021-03-20 LAB — CBC WITH DIFFERENTIAL/PLATELET
Abs Immature Granulocytes: 0.05 10*3/uL (ref 0.00–0.07)
Basophils Absolute: 0.1 10*3/uL (ref 0.0–0.1)
Basophils Relative: 1 %
Eosinophils Absolute: 0.4 10*3/uL (ref 0.0–0.5)
Eosinophils Relative: 6 %
HCT: 35.9 % — ABNORMAL LOW (ref 39.0–52.0)
Hemoglobin: 11.1 g/dL — ABNORMAL LOW (ref 13.0–17.0)
Immature Granulocytes: 1 %
Lymphocytes Relative: 19 %
Lymphs Abs: 1.3 10*3/uL (ref 0.7–4.0)
MCH: 24.2 pg — ABNORMAL LOW (ref 26.0–34.0)
MCHC: 30.9 g/dL (ref 30.0–36.0)
MCV: 78.2 fL — ABNORMAL LOW (ref 80.0–100.0)
Monocytes Absolute: 0.6 10*3/uL (ref 0.1–1.0)
Monocytes Relative: 9 %
Neutro Abs: 4.4 10*3/uL (ref 1.7–7.7)
Neutrophils Relative %: 64 %
Platelets: 271 10*3/uL (ref 150–400)
RBC: 4.59 MIL/uL (ref 4.22–5.81)
RDW: 14.6 % (ref 11.5–15.5)
WBC: 6.7 10*3/uL (ref 4.0–10.5)
nRBC: 0 % (ref 0.0–0.2)

## 2021-03-20 MED ORDER — SODIUM CHLORIDE 0.9 % IV SOLN
Freq: Once | INTRAVENOUS | Status: AC
  Filled 2021-03-20: qty 250

## 2021-03-20 MED ORDER — HEPARIN SOD (PORK) LOCK FLUSH 100 UNIT/ML IV SOLN
500.0000 [IU] | Freq: Once | INTRAVENOUS | Status: DC
  Filled 2021-03-20: qty 5

## 2021-03-20 MED ORDER — HEPARIN SOD (PORK) LOCK FLUSH 100 UNIT/ML IV SOLN
500.0000 [IU] | Freq: Once | INTRAVENOUS | Status: AC | PRN
Start: 2021-03-20 — End: 2021-03-20
  Administered 2021-03-20: 500 [IU]
  Filled 2021-03-20: qty 5

## 2021-03-20 MED ORDER — HEPARIN SOD (PORK) LOCK FLUSH 100 UNIT/ML IV SOLN
INTRAVENOUS | Status: AC
  Filled 2021-03-20: qty 5

## 2021-03-20 MED ORDER — SODIUM CHLORIDE 0.9 % IV SOLN
200.0000 mg | Freq: Once | INTRAVENOUS | Status: AC
  Administered 2021-03-20: 200 mg via INTRAVENOUS
  Filled 2021-03-20: qty 8

## 2021-03-20 MED ORDER — SODIUM CHLORIDE 0.9% FLUSH
10.0000 mL | Freq: Once | INTRAVENOUS | Status: AC
  Administered 2021-03-20: 10 mL via INTRAVENOUS
  Filled 2021-03-20: qty 10

## 2021-03-20 NOTE — Patient Instructions (Signed)
[facility] [place] [facility]  Discharge Instructions: Thank you for choosing [facility] to provide your oncology and hematology care.  If you have a lab appointment with the Cancer Center, please go directly to the Cancer Center and check in at the registration area.  Wear comfortable clothing and clothing appropriate for easy access to any Portacath or PICC line.   We strive to give you quality time with your provider. You may need to reschedule your appointment if you arrive late (15 or more minutes).  Arriving late affects you and other patients whose appointments are after yours.  Also, if you miss three or more appointments without notifying the office, you may be dismissed from the clinic at the provider's discretion.      For prescription refill requests, have your pharmacy contact our office and allow 72 hours for refills to be completed.    Today you received the following chemotherapy and/or immunotherapy agents keytruda   To help prevent nausea and vomiting after your treatment, we encourage you to take your nausea medication as directed.  BELOW ARE SYMPTOMS THAT SHOULD BE REPORTED IMMEDIATELY: *FEVER GREATER THAN 100.4 F (38 C) OR HIGHER *CHILLS OR SWEATING *NAUSEA AND VOMITING THAT IS NOT CONTROLLED WITH YOUR NAUSEA MEDICATION *UNUSUAL SHORTNESS OF BREATH *UNUSUAL BRUISING OR BLEEDING *URINARY PROBLEMS (pain or burning when urinating, or frequent urination) *BOWEL PROBLEMS (unusual diarrhea, constipation, pain near the anus) TENDERNESS IN MOUTH AND THROAT WITH OR WITHOUT PRESENCE OF ULCERS (sore throat, sores in mouth, or a toothache) UNUSUAL RASH, SWELLING OR PAIN  UNUSUAL VAGINAL DISCHARGE OR ITCHING   Items with * indicate a potential emergency and should be followed up as soon as possible or go to the Emergency Department if any problems should occur.  Please show the CHEMOTHERAPY ALERT CARD or IMMUNOTHERAPY ALERT CARD at check-in to  the Emergency Department and triage nurse.  Should you have questions after your visit or need to cancel or reschedule your appointment, please contact [facility] [place] [facility]  [phone] and follow the prompts.  Office hours are 8:00 a.m. to 4:30 p.m. Monday - Friday. Please note that voicemails left after 4:00 p.m. may not be returned until the following business day.  We are closed weekends and major holidays. You have access to a nurse at all times for urgent questions. Please call the main number to the clinic [phone] and follow the prompts.  For any non-urgent questions, you may also contact your provider using MyChart. We now offer e-Visits for anyone 18 and older to request care online for non-urgent symptoms. For details visit [email or web address].   Also download the MyChart app! Go to the app store, search "MyChart", open the app, select [facility], and log in with your MyChart username and password.  Due to Covid, a mask is required upon entering the hospital/clinic. If you do not have a mask, one will be given to you upon arrival. For doctor visits, patients may have 1 support person aged 18 or older with them. For treatment visits, patients cannot have anyone with them due to current Covid guidelines and our immunocompromised population.  

## 2021-03-20 NOTE — Progress Notes (Signed)
[place] NOTE  Patient Care Team: [name], MD as PCP - General (Internal Medicine) Cammie Sickle, MD as Consulting Physician (Internal Medicine) Bary Castilla Forest Gleason, MD as Consulting Physician (General Surgery)  CHIEF COMPLAINTS/PURPOSE OF CONSULTATION: Urothelial cancer  #  Oncology History Overview Note  # SEP-OCT 2019-right renal pelvis/ ureteral [cytology positive HIGH grade urothelial carcinoma [Dr.Brandon]    # NOV 24th 2019-Keytruda [consent]  # April 2022- colonoscopy [Dr.Anna;incidental PET- sigmoid uptake] ~25 mm polypoid lesion-biopsy mucinous carcinoma- s/p Dr.Byrnett [facility] 2022]-repeat colonoscopy in 6 months [multiple comorbidities]  #Right ureteral obstruction status post stent placement  # JAN 2019- Right Colon ca [ T4N1]  [[facility] NO adjuvant therapy  # Hep C/ # stroke of left side/weakness-2018 Nov [[facility]]; active smoker  DIAGNOSIS: # 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  Is alone.  Chad [name] 60 y.o.  male above history of stage IV-ureteral cancer/history of stage III colon cancer right side; recurrent sigmoid colon cancer [un-resected/under surveillance] and multiple other comorbidities currently on Keytruda is here for follow-up.  No blood in stools no black or stools.  No new abdominal pain nausea vomiting.  No blood in urine.  Chronic shortness of breath. Review of Systems  Constitutional:  Positive for malaise/fatigue. Negative for chills, diaphoresis, fever and weight loss.  HENT:  Negative for  nosebleeds and sore throat.   Eyes:  Negative for double vision.  Respiratory:  Positive for cough and shortness of breath. Negative for hemoptysis and wheezing.   Cardiovascular:  Negative for chest pain, palpitations and orthopnea.  Gastrointestinal:  Positive for constipation. Negative for abdominal pain, blood in stool, diarrhea, heartburn, melena, nausea and vomiting.  Genitourinary:  Negative for dysuria, frequency and urgency.  Musculoskeletal:  Positive for back pain and joint pain.  Skin: Negative.  Negative for itching and rash.  Neurological:  Positive for focal weakness. Negative for dizziness, tingling, weakness and headaches.       Chronic left-sided weakness upper than lower extremity.  Endo/Heme/Allergies:  Does not bruise/bleed easily.  Psychiatric/Behavioral:  Negative for depression. The patient is not nervous/anxious and does not have insomnia.     MEDICAL HISTORY:  Past Medical History:  Diagnosis Date   Anemia    Anxiety    ARF (acute respiratory failure) (HCC)    Bladder cancer (HCC)    COPD (chronic obstructive pulmonary disease) ([place])    Depression    Dysphagia    Family history of colon cancer    Family history of kidney cancer    Family history of leukemia    Family history of prostate cancer    GERD (gastroesophageal reflux disease)    Hepatitis    chronic hep c   Hydronephrosis    Hydronephrosis with ureteral stricture    Hyperlipidemia    Knee pain    Left   Malignant neoplasm of colon ([place])    Nerve pain    Peripheral vascular disease ([place])    Prostate cancer ([place])    Stroke (  [place])    Urinary frequency    Venous hypertension of both lower extremities     SURGICAL HISTORY: Past Surgical History:  Procedure Laterality Date   COLON SURGERY     En bloc extended right hemicolectomy 07/2017   COLONOSCOPY WITH PROPOFOL N/A 11/06/2020   Procedure: COLONOSCOPY WITH PROPOFOL;  Surgeon: Jonathon Bellows, MD;  Location: [facility] ENDOSCOPY;  Service:  Gastroenterology;  Laterality: N/A;   CYSTOSCOPY W/ RETROGRADES Right 08/30/2018   Procedure: CYSTOSCOPY WITH RETROGRADE PYELOGRAM;  Surgeon: Hollice Espy, MD;  Location: [facility] ORS;  Service: Urology;  Laterality: Right;   CYSTOSCOPY WITH STENT PLACEMENT Right 04/25/2018   Procedure: CYSTOSCOPY WITH STENT PLACEMENT;  Surgeon: Hollice Espy, MD;  Location: [facility] ORS;  Service: Urology;  Laterality: Right;   CYSTOSCOPY WITH STENT PLACEMENT Right 08/30/2018   Procedure: [place];  Surgeon: Hollice Espy, MD;  Location: [facility] ORS;  Service: Urology;  Laterality: Right;   CYSTOSCOPY WITH STENT PLACEMENT Right 03/07/2019   Procedure: CYSTOSCOPY WITH STENT [place];  Surgeon: Hollice Espy, MD;  Location: [facility] ORS;  Service: Urology;  Laterality: Right;   CYSTOSCOPY WITH STENT PLACEMENT Right 11/21/2019   Procedure: CYSTOSCOPY WITH STENT [place];  Surgeon: Hollice Espy, MD;  Location: [facility] ORS;  Service: Urology;  Laterality: Right;   LOWER EXTREMITY ANGIOGRAPHY Left 05/23/2019   Procedure: LOWER EXTREMITY ANGIOGRAPHY;  Surgeon: Algernon Huxley, MD;  Location: [place] CV LAB;  Service: Cardiovascular;  Laterality: Left;   LOWER EXTREMITY ANGIOGRAPHY Right 05/30/2019   Procedure: LOWER EXTREMITY ANGIOGRAPHY;  Surgeon: Algernon Huxley, MD;  Location: [place] CV LAB;  Service: Cardiovascular;  Laterality: Right;   LOWER EXTREMITY ANGIOGRAPHY Right 02/13/2020   Procedure: LOWER EXTREMITY ANGIOGRAPHY;  Surgeon: Algernon Huxley, MD;  Location: [place] CV LAB;  Service: Cardiovascular;  Laterality: Right;   LOWER EXTREMITY ANGIOGRAPHY Left 02/20/2020   Procedure: LOWER EXTREMITY ANGIOGRAPHY;  Surgeon: Algernon Huxley, MD;  Location: [place] CV LAB;  Service: Cardiovascular;  Laterality: Left;   PORTA CATH INSERTION N/A 02/28/2019   Procedure: PORTA CATH INSERTION;  Surgeon: Algernon Huxley, MD;  Location: [place] CV LAB;  Service: Cardiovascular;  Laterality: N/A;   tumor  removed       SOCIAL HISTORY: Social History   Socioeconomic History   Marital status: Single    Spouse name: Not on file   Number of children: Not on file   Years of education: Not on file   Highest education level: Not on file  Occupational History   Not on file  Tobacco Use   Smoking status: Every Day    Packs/day: 1.00    Types: Cigarettes   Smokeless tobacco: Never  Vaping Use   Vaping Use: Never used  Substance and Sexual Activity   Alcohol use: Not Currently   Drug use: Not Currently   Sexual activity: Not Currently  Other Topics Concern   Not on file  Social History Narrative    used to live [place]; moved  To [place]- end of April 2019; in Nursing home; 1pp/day; quit alcohol. 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        Family- dad- prostate ca [at 27y]; brother- 13 died of prostate cancer; brother- 24- no cancers [[place]]; sonGerald Stabs [[place]];[name]-32y prostate ca [facility] [place]]; daughter- 56 [[place]]; another daughter 75 [[place]/addict]. will refer genetics counseling. Given MSI- abnormal; highly suspicious of Lynch syndrome.  Patient's son [name] aware of high possible lynch syndrome.   Social  Determinants of Health   Financial Resource Strain: Not on file  Food Insecurity: Not on file  Transportation Needs: Not on file  Physical Activity: Not on file  Stress: Not on file  Social Connections: Not on file  Intimate Partner Violence: Not on file    FAMILY HISTORY: Family History  Problem Relation Age of Onset   Prostate cancer Father 31   Cancer Brother 51       unsure type   Cancer Paternal Uncle        unsure type   Cancer Maternal Grandmother        unsure type   Cancer Paternal Grandmother        unsure type   Kidney cancer Paternal Grandfather    Cancer Other        unsure types   Leukemia Son    Cancer Son        other cancers, possibly colon    ALLERGIES:  is allergic to penicillins.  MEDICATIONS:  Current Outpatient Medications  Medication Sig  Dispense Refill   acetaminophen (TYLENOL) 325 MG tablet Take 650 mg by mouth 3 (three) times daily.      aspirin EC 81 MG tablet Take 1 tablet (81 mg total) by mouth daily. 150 tablet 2   atorvastatin (LIPITOR) 10 MG tablet Take 1 tablet (10 mg total) by mouth daily. 30 tablet 11   carboxymethylcellulose 1 % ophthalmic solution Apply 1 drop to eye 2 (two) times daily.     Cholecalciferol (VITAMIN D) 50 MCG (2000 UT) CAPS Take by mouth.     clopidogrel (PLAVIX) 75 MG tablet Take 1 tablet (75 mg total) by mouth daily. 30 tablet 11   cyclobenzaprine (FLEXERIL) 10 MG tablet Take 10 mg by mouth at bedtime.     escitalopram (LEXAPRO) 5 MG tablet Take 5 mg by mouth daily.     gabapentin (NEURONTIN) 100 MG capsule Take 100 mg by mouth 3 (three) times daily.     mirtazapine (REMERON) 7.5 MG tablet Take 7.5 mg by mouth at bedtime.     Oxycodone HCl 10 MG TABS Take 10 mg by mouth every 6 (six) hours as needed for pain.     pantoprazole (PROTONIX) 40 MG tablet Take 40 mg by mouth daily.     RESTORE CALCIUM ALGINATE EX Apply topically. Note: Dressing located on left ankle. Facility cleaning wound "every day shift and applying wound cleanser and appication of calcium Alginate and cover with dry dressing and wrapped with Kerlix"     senna-docusate (SENOKOT-S) 8.6-50 MG tablet Take 1 tablet by mouth daily.     No current facility-administered medications for this visit.      Marland Kitchen  PHYSICAL EXAMINATION: ECOG PERFORMANCE STATUS: 1 - Symptomatic but completely ambulatory  Vitals:   03/20/21 0833  BP: 100/69  Pulse: 65  Resp: 16  Temp: 97.6 F (36.4 C)  SpO2: 98%   Filed Weights   03/20/21 0833  Weight: 160 lb (72.6 kg)    Physical Exam Constitutional:      Comments: In wheelchair. Cachectic.  Appears older than his stated age.  Alone.  HENT:     Head: Normocephalic and atraumatic.     Mouth/Throat:     Pharynx: No oropharyngeal exudate.  Eyes:     Pupils: Pupils are equal, round, and  reactive to light.  Cardiovascular:     Rate and Rhythm: Normal rate and regular rhythm.  Pulmonary:     Effort: No respiratory distress.  Breath sounds: No wheezing.  Abdominal:     General: Bowel sounds are normal. There is no distension.     Palpations: Abdomen is soft. There is no mass.     Tenderness: no abdominal tenderness There is no guarding or rebound.  Musculoskeletal:        General: No tenderness. Normal range of motion.     Cervical back: Normal range of motion and neck supple.  Skin:    General: Skin is warm.     Comments: Bilateral lower extremity ulcerations noted.  Pulses intact bilaterally.  Neurological:     Mental Status: He is oriented to person, place, and time.     Comments: Sleepy but easily arousable.  Chronic weakness left upper and lower extremity.  Psychiatric:        Mood and Affect: Affect normal.     LABORATORY DATA:  I have reviewed the data as listed Lab Results  Component Value Date   WBC 6.7 03/20/2021   HGB 11.1 (L) 03/20/2021   HCT 35.9 (L) 03/20/2021   MCV 78.2 (L) 03/20/2021   PLT 271 03/20/2021   Recent Labs    03/29/20 0824 04/19/20 0858 05/10/20 0915 01/30/21 0815 02/20/21 0815 03/20/21 0757  NA 139 134*   < > 135 134* 134*  K 4.1 3.8   < > 4.4 4.3 4.3  CL 104 101   < > 103 104 102  CO2 24 26   < > _0 GLUCOSE 94 140*   < > 90 88 95  BUN 21 19   < > 21 24* 23  CREATININE 0.76 0.92   < > 0.81 0.96 0.96  CALCIUM 8.6* 8.4*   < > 8.7* 8.4* 8.5*  GFRNONAA >60 >60   < > >60 >60 >60  GFRAA >60 >60  --   --   --   --   PROT 7.7 7.8   < > 7.6 7.5 8.3*  ALBUMIN 3.6 3.7   < > 3.8 3.7 3.7  AST 28 33   < > 35 35 30  ALT 20 24   < > _1 ALKPHOS 118 115   < > 113 102 109  BILITOT 0.4 0.4   < > 0.7 0.3 0.4   < > = values in this interval not displayed.    RADIOGRAPHIC STUDIES: I have personally reviewed the radiological images as listed and agreed with the findings in the report. No results found.  ASSESSMENT &  PLAN:   Ureteral cancer, right ([place]) # High-grade urothelial cancer/cytology; likely of the right renal pelvis /upper ureter. JAN 5th, 2022- CT findings worrisome for progressive right renal pelvis and ureteral tumor and mass suspicious for colonic neoplasm involving the descending colon sigmoid colon junction region [see below].  Mild soft tissue thickening around the distal right ureter could reflect ureteral tumor; FEB 2022-  RIGHT renal pelvis and bladder are not evaluable by FDG PET imaging due to high activity of radiotracer within the urine-STABLE.   but see below re: sigmoid mass/colon cancer. STABLE.   # proceed with  [place] today; Labs today reviewed; Labs today reviewed;  acceptable for treatment today.   #Descending colon mass- PET scan- FEB 2022-uptake in the cecum;  [history of Lynch syndrome]-colonoscopy 25 mm polyp; biopsy mucinous carcinoma- given risk vs benefits -hold surgery for now; re-eval with colonoscopy in October- STABLE.   # Iron deficiency anemia-hemoglobin 11.5; February 2022 ron studies/ferritin-LOW; hold Venofer with infusions-  STABLE.  # Bilateral LE ulcers-s/p wound care evaluation; s/p PTCA with Dr.Dew. bil Arterial Dopp-wnl- STABLE.   #DISPOSITION: #  Keytruda; todya # follow up in 3 weeks-;MD labs-cbc/cmp;  Keytruda; possible venofer- Dr.B.   All questions were answered. The patient knows to call the clinic with any problems, questions or concerns.    Cammie Sickle, MD 03/20/2021 11:26 AM

## 2021-03-20 NOTE — Assessment & Plan Note (Addendum)
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 JAN 5th, 2022- CT findings worrisome for progressive right renal pelvis and ureteral tumor and mass suspicious for colonic neoplasm involving the descending colon sigmoid colon junction region [see below].  Mild soft tissue thickening around the distal right ureter could reflect ureteral tumor; FEB 2022-  RIGHT renal pelvis and bladder are not evaluable by FDG PET imaging due to high activity of radiotracer within the urine-STABLE.   but see below re: sigmoid mass/colon cancer. STABLE.   # proceed with  [place] today; Labs today reviewed; Labs today reviewed;  acceptable for treatment today.   #Descending colon mass- PET scan- FEB 2022-uptake in the cecum;  [history of Lynch syndrome]-colonoscopy 25 mm polyp; biopsy mucinous carcinoma- given risk vs benefits -hold surgery for now; re-eval with colonoscopy in October- STABLE.   # Iron deficiency anemia-hemoglobin 11.5; February 2022 ron studies/ferritin-LOW; hold Venofer with infusions- STABLE.  # Bilateral LE ulcers-s/p wound care evaluation; s/p PTCA with Dr.Dew. bil Arterial Dopp-wnl- STABLE.   #DISPOSITION: #  Keytruda; todya # follow up in 3 weeks-;MD labs-cbc/cmp;  Keytruda; possible venofer- Dr.B.

## 2021-03-25 ENCOUNTER — Other Ambulatory Visit: Payer: Self-pay

## 2021-03-25 ENCOUNTER — Encounter: Payer: Self-pay | Admitting: Nurse Practitioner

## 2021-03-25 ENCOUNTER — Encounter: Payer: Medicaid Other | Admitting: Physician Assistant

## 2021-03-25 ENCOUNTER — Non-Acute Institutional Stay: Payer: Medicaid Other | Admitting: Nurse Practitioner

## 2021-03-25 VITALS — BP 132/72 | HR 78 | Temp 97.9°F | Resp 18 | Wt 162.0 lb

## 2021-03-25 DIAGNOSIS — L97512 Non-pressure chronic ulcer of other part of right foot with fat layer exposed: Secondary | ICD-10-CM | POA: Diagnosis not present

## 2021-03-25 DIAGNOSIS — G894 Chronic pain syndrome: Secondary | ICD-10-CM

## 2021-03-25 DIAGNOSIS — R630 Anorexia: Secondary | ICD-10-CM

## 2021-03-25 DIAGNOSIS — Z515 Encounter for palliative care: Secondary | ICD-10-CM

## 2021-03-25 NOTE — Progress Notes (Signed)
Chad [name] Consult Note Telephone: [phone]  Fax: [phone]    Date of encounter: 03/25/21 5:28 PM PATIENT NAME: Chad [name] [id number]   [phone] (home)  DOB: [birth year] MRN: [id number] PRIMARY CARE PROVIDER:    Dr Lucianne Lei [facility]  RESPONSIBLE PARTY:    Contact Information     Name Relation Home Work [place], [place] Son   [phone]      I met face to face with patient in facility. Palliative Care was asked to follow this patient by consultation request of  Dr Nona Dell to address advance care planning and complex medical decision making. This is a follow up visit.                                  ASSESSMENT AND PLAN / RECOMMENDATIONS:  Symptom Management/Plan: 1. ACP: DNR; treat what is treatable including hospitalizations if necessary   2.Anorexia secondary to protein calorie malnutrition related to cancer improving ongoing. 6lbs weight gain with BMI 25.4; current weight 162 lbs. Continue to monitor daily weights, supplements, supportive measures and courage to eat   3. Chronic pain reviewed, discussed and educated continue to monitor on pain scale, monitor efficacy vs adverse side effects.Will continue to monitor; encouraged to ask when in pain, Continue to follow by psychotherapy and psychiatry for counseling; supportive services. Continue with Oxycodone 36m q6 prn   4. Palliative care encounter; Palliative medicine team will continue to support patient, patient's family, and medical team. Visit consisted of counseling and education dealing with the complex and emotionally intense issues of symptom management and palliative care in the setting of serious and potentially life-threatening illness  Follow up Palliative Care Visit: Palliative care will continue to follow for complex medical decision making, advance care planning, and clarification of goals.  Return 8 weeks or prn.  I spent 66 minutes providing this consultation. More than 50% of the time in this consultation was spent in counseling and care coordination PPS: 50%  Chief Complaint: Follow up palliative consult for complex medical decision making  HISTORY OF PRESENT ILLNESS:  RExcell [name] a 60y.o. year old male  with multiple medical problems including   late onset CVA, urothelial cancer, prostate cancer, chronic hepatitis C, hyperlipidemia, colon surgery, protein calorie malnutrition, anxiety, depression. [facility]. Mr. LNovicktransfers himself to w/c, requires assistance for ADL's. Mr. LBlackwellfeeds himself with fair to declined appetite. Mr. LRawdoncontinues to smoke daily. Followed by Psychiatry 12/27/2020 for recurrent depressive episode with h/o MDD prescribed escitalopram. No changes at this visit. 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 No recent falls, infections, hospitalization. Saw Dr BRogue Bussing8/24/2022 for High-grade urothelial cancer/cytology; likely of the right renal pelvis /upper ureter, proceeded with keytruda infusion. IDA stable. Bilateral LE ulcers s/p PTCA seen at wound care today. Staff endorses no other changes or concerns, pain appears controlled. I visited and observed Mr. LKeltner We talked about purpose of PC visit, Mr. LMisnerin agreement. We talked about how he is feeling. Mr. LRoudebushendorses he is doing better, returned from wound clinic today with improvements. We talked about symptoms of pain, reviewed with  current regimen. No sob. We talked about appetite which continues to improve, discussed 6lbs weight gain. We talked about recent psychiatry visit 03/07/2021 for major  depression currently taking escitalopram and remeron for mood with improvement. We talked about smoking as Chad [name] is currently an active smoker, frequent visitor on smoking patio. Chad [name] endorses that one thing that brings him joy. Chad [name] declines smoking cessation. We talked about self care. No change in functional abilities. We talked about w/c dependent with limitations. We talked about residing at SNF with barriers and coping strategies. We talked about dx cancer, treatment with [place]. We talked about medical goals of care. We talked about role pc in poc. We talked about f/u pc visit, Chad [name] in agreement, thankful for pc visit. Therapeutic listening, emotional support provided. Questions answered. I updated staff, no new changes to poc  Oncology History Overview Note   # SEP-OCT 2019-right renal pelvis/ ureteral [cytology positive HIGH grade urothelial carcinoma [Dr.Brandon]     # NOV 24th 2019-Keytruda [consent]   # April 2022- colonoscopy [Dr.Anna;incidental PET- sigmoid uptake] ~25 mm polypoid lesion-biopsy mucinous carcinoma- s/p Dr.Byrnett [May 2022]-repeat colonoscopy in 6 months [multiple comorbidities]   #Right ureteral obstruction status post stent placement   # JAN 2019- Right Colon ca [ T4N1]  [[facility] NO adjuvant therapy   # Hep C/ # stroke of left side/weakness-2018 Nov [[facility]]; active smoker   DIAGNOSIS: # 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; # Colon ca- stage III   GOALS: palliative   CURRENT/MOST RECENT THERAPY: Keytruda [C]      Urothelial cancer ([place])     Initial Diagnosis     Urothelial cancer ([place])     Ureteral cancer, right ([place])   05/26/2018 Initial Diagnosis     Ureteral cancer, right ([place])     06/21/2018 -  Chemotherapy      Patient is on Treatment Plan: UROTHELIAL CANCER- PEMBROLIZUMAB Q21D              History obtained from review of EMR, discussion with facility staff and Chad [name].  I reviewed available labs, medications,  imaging, studies and related documents from the EMR.  Records reviewed and summarized above.   ROS Full 14 system review of systems performed and negative with exception of: as per HPI.   Physical Exam: Constitutional: NAD General: chronically ill, w/c dependent pleasant male EYES:  lids intact ENMT: oral mucous membranes moist CV: S1S2, RRR, +BLE edema Pulmonary: LCTA, no increased work of breathing, no cough, room air Abdomen: normo-active BS + 4 quadrants, soft and non tender MSK: w/c dependent  Skin: warm and dry Neuro:  + generalized weakness,  no cognitive impairment Psych: non-anxious affect, A and O x 3  Questions and concerns were addressed. Provided general support and encouragement, no other unmet needs identified   Thank you for the opportunity to participate in the care of Mr. Putman.  The palliative care team will continue to follow. Please call our office at [phone] if we can be of additional assistance.   This chart was dictated using voice recognition software.  Despite best efforts to proofread,  errors can occur which can change the documentation meaning.   Lycan Davee Ihor Gully, NP

## 2021-03-25 NOTE — Progress Notes (Addendum)
Chad, [name] ([id number]) Visit Report for 03/25/2021 Chief Complaint Document Details Patient Name: Chad [name], Chad [name] Date of Service: 03/25/2021 9:00 AM Medical Record Number: [id number] Patient Account Number: [id number] Date of Birth/Sex: 08/11/1960 (60 y.o. M) Treating RN: Dolan Amen Primary Care Provider: Clayborn Bigness Other Clinician: Referring Provider: Clayborn Bigness Treating Provider/Extender: Skipper Cliche in Treatment: 31 Information Obtained from: Patient Chief Complaint Left ankle and right foot and heel ulcers Electronic Signature(s) Signed: 03/25/2021 9:01:26 AM By: Worthy Keeler PA-C Entered By: Worthy Keeler on 03/25/2021 09:01:26 Chad [name] ([id number]) -------------------------------------------------------------------------------- Debridement Details Patient Name: Chad [name] Date of Service: 03/25/2021 9:00 AM Medical Record Number: [id number] Patient Account Number: [id number] Date of Birth/Sex: Sep 29, 1960 (60 y.o. M) Treating RN: Donnamarie Poag Primary Care Provider: Clayborn Bigness Other Clinician: Referring Provider: Clayborn Bigness Treating Provider/Extender: Skipper Cliche in Treatment: 31 Debridement Performed for Wound #8 Left,Medial Ankle Assessment: Performed By: Physician Tommie Sams., PA-C Debridement Type: Debridement Severity of Tissue Pre Debridement: Fat layer exposed Level of Consciousness (Pre- Awake and Alert procedure): Pre-procedure Verification/Time Out Yes - 09:38 Taken: Start Time: 09:38 Pain Control: Lidocaine Total Area Debrided (L x W): 2 (cm) x 1.5 (cm) = 3 (cm) Tissue and other material Slough, Subcutaneous, Biofilm, Slough debrided: Level: Skin/Subcutaneous Tissue Debridement Description: Excisional Instrument: Curette Bleeding: Minimum Hemostasis Achieved: Pressure Response to Treatment: Procedure was tolerated well Level of Consciousness (Post- Awake and Alert procedure): Post Debridement Measurements  of Total Wound Length: (cm) 2 Width: (cm) 1.5 Depth: (cm) 0.1 Volume: (cm) 0.236 Character of Wound/Ulcer Post Debridement: Improved Severity of Tissue Post Debridement: Fat layer exposed Post Procedure Diagnosis Same as Pre-procedure Electronic Signature(s) Signed: 03/25/2021 9:42:50 AM By: Donnamarie Poag Signed: 03/25/2021 5:05:16 PM By: Worthy Keeler PA-C Entered By: Donnamarie Poag on 03/25/2021 09:39:37 Chad [name] ([id number]) -------------------------------------------------------------------------------- HPI Details Patient Name: Chad [name] Date of Service: 03/25/2021 9:00 AM Medical Record Number: [id number] Patient Account Number: [id number] Date of Birth/Sex: 1961-04-04 (60 y.o. M) Treating RN: Dolan Amen Primary Care Provider: Clayborn Bigness Other Clinician: Referring Provider: Clayborn Bigness Treating Provider/Extender: Skipper Cliche in Treatment: 31 History of Present Illness HPI Description: 10/08/18 on evaluation today patient actually presents to our office for initial evaluation concerning wounds that he has of the bilateral lower extremities. 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 He also has bilateral venous stasis. He apparently has been homeless currently following discharge from the hospital apparently he has been placed at [facility] which is is a skilled nursing facility locally. Nonetheless fortunately he does not show any signs of infection at this time which is good news. In fact several of the wound actually appears to be showing some signs of improvement already in my pinion. There are a couple areas in the left leg in particular there likely gonna require some sharp debridement to help clear away some necrotic tissue and help with more sufficient healing. No fevers, chills, nausea,  or vomiting noted at this time. 10/15/18 on evaluation today patient actually appears to be doing very well in regard to his bilateral lower extremities. He's been tolerating the dressing changes without complication. Fortunately there does not appear to be any evidence of active infection at this time which is great news. Overall I'm actually very pleased with how this has progressed in just one visits time. Readmission: 08/14/2020 upon evaluation today  patient presents for re-evaluation here in our clinic. He is having issues with his left ankle region as well as his right toe and his right heel. He tells me that the toe and heel actually began as a area that was itching that he was scratching and then subsequently opened up into wounds. These may have been abscess areas I presume based on what I am seeing currently. With regard to his left ankle region he tells me this was a similar type occurrence although he does have venous stasis this very well may be more of a venous leg ulcer more than anything. Nonetheless I do believe that the patient would benefit from appropriate and aggressive wound care to try to help get things under better control here. He does have history of a stroke on the left side affecting him to some degree there that he is able to stand although he does have some residual weakness. Otherwise again the patient does have chronic venous insufficiency as previously noted. His arterial studies most recently obtained showed that he had an ABI on the right of 1.16 with a TBI of 0.52 and on the left and ABI of 1.14 with a TBI of 0.81. That was obtained on 06/19/2020. 08/28/2020 upon evaluation today patient appears to be doing decently well in regard to his wounds in general. He has been tolerating the dressing changes without complication. Fortunately there does not appear to be any signs of active infection which is great news. With that being said I think the [facility] is doing  a good job I would recommend that we likely continue with that currently. 09/11/2020 upon evaluation today patient's wounds did not appear to be doing too poorly but again he is not really showing signs of significant improvement with regard to any of the wounds on the right. None of them have Hydrofera Blue on them I am not exactly sure why this is not being followed as the facility did not contact us to let us know of any issues with obtaining dressings or otherwise. With that being said he is supposed to be using Hydrofera Blue on both of the wounds on the right foot as well as the ankle wound on the left side. 09/18/2020 upon evaluation today patient appears to be doing poorly with regard to his wounds. Again right now the left ankle in particular showed signs of extreme maceration. Apparently he was told by someone with staff at [place] they could not get the [facility]. With that being said this is something that is never been relayed to [place] one way or another. Also the patient subsequently has not supposed to have a border gauze dressing on. He should have an ABD pad and roll gauze to secure as this drains much too much just to have a border gauze dressing to cover. Nonetheless the fact that they are not using the appropriate dressing is directly causing deterioration of the left ankle wound it is significantly worse today compared to what it was previous. I did attempt to call [facility] while the patient was here I called three times and got no one to even pick up the phone. After this I had my for an office coordinator call and she was able to finally get through and leave a message with the D ON as of dictation of this note which is roughly about an hour and a half later I still have not been able to speak with anyone at the facility.  09/25/2020 upon evaluation today patient actually showing signs of good improvement which is excellent news. He has been tolerating the  dressing changes without complication. Fortunately there is no signs of active infection which is great news. No fevers, chills, nausea, vomiting, or diarrhea. I do feel like the facility has been doing a much better job at taking care of him as far as the dressings are concerned. However the director of nursing never did call me back. 10/09/2020 upon evaluation today patient appears to be doing well with regard to his wound. The toe ulcer did require some debridement but the other 2 areas actually appear to be doing quite well. 10/19/2020 upon evaluation today patient actually appears to be doing very well in regard to his wounds. In fact the heel does appear to be completely healed. The toe is doing better in the medial ankle on the left is also doing better. Overall I think he is headed in the right direction. 10/26/2020 upon evaluation today patient appears to be doing well with regard to his wound. He is showing signs of improvement which is great news and overall I am very pleased with where things stand today. No fevers, chills, nausea, vomiting, or diarrhea. 11/02/2020 upon evaluation today patient appears to be doing well with regard to his wounds. He has been tolerating the dressing changes without complication overall I am extremely pleased with where things stand today. He in regard to the toe is almost completely healed and the medial ankle on the left is doing much better. 11/09/2020 upon evaluation today patient appears to be doing a little poorly in regard to his left medial ankle ulcer. Fortunately there does not appear to be any signs of systemic infection but unfortunately locally he does appear to be infected in fact he has blue-green drainage consistent with Pseudomonas. ROALD, WOLLAM ([id number]) 11/16/2020 upon evaluation today patient appears to be doing well with regard to his wound. It actually appears to be doing better. I did place him on gentamicin cream since the Cipro was  actually resistant even though he was positive for Pseudomonas on culture. Overall I think that he does seem to be doing better though I am unsure whether or not they have actually been putting the cream on. The patient is not sure that we did talk to the nurse directly and she was going to initiate that treatment. Fortunately there does not appear to be any signs of active infection at this time. No fevers, chills, nausea, vomiting, or diarrhea. 4/28; the area on the right second toe is close to healed. Left medial ankle required debridement 12/07/2020 upon evaluation today patient appears to be doing well with regard to his wounds. In fact the right second toe appears to be completely healed which is great news. Fortunately there does not appear to be any signs of active infection at this time which is also great news. I think we can probably discontinue the gentamicin on top of everything else. 12/14/2020 upon evaluation today patient appears to be doing well with regard to his wound. He is making good progress and overall very pleased with where things stand today. There is no signs of active infection at this time which is great news. 12/28/2020 upon evaluation today patient appears to be doing well with regard to his wounds. He has been tolerating the dressing changes without complication. Fortunately there is no signs of active infection at this time. No fevers, chills, nausea, vomiting, or diarrhea. 12/28/2020 upon evaluation today  patient's wound bed actually showed signs of excellent improvement. He has great epithelization and granulation I do not see any signs of infection overall I am extremely pleased with where things stand at this point. No fevers, chills, nausea, vomiting, or diarrhea. 01/11/2021 upon evaluation today patient appears to be doing well with regard to his wound on his leg. He has been tolerating the dressing changes without complication. Fortunately there does not appear to be  any signs of active infection which is great news. No fevers, chills, nausea, vomiting, or diarrhea. 01/25/2021 upon evaluation today patient appears to be doing well with regard to his wound. He has been tolerating the dressing changes without complication. Fortunately the collagen seems to be doing a great job which is excellent news. No fevers, chills, nausea, vomiting, or diarrhea. 02/08/2021 upon evaluation today patient's wound is actually looking a little bit worse especially in the periwound compared to previous. Fortunately there does not appear to be any signs of infection which is great news with that being said he does have some irritation around the periphery of the wound which has me more concerned. He actually had a dressing on that had not been changed in 3 days. He also is supposed to have daily dressing changes. With regard to the dressing applied he had a silver alginate dressing and silver collagen is what is recommended and ordered. He also had no Desitin around the edges of the wound in the periwound region although that is on the order inspect to be done as well. In general I was very concerned I did contact [facility] actually spoke with Magda Paganini who is the scheduling individual and subsequently she stated that she would pass the information to the D ON apparently the D ON was not available to talk to me when I call today. 02/18/2021 upon evaluation today patient's wound is actually showing signs of improvement. Fortunately there does not appear to be any evidence of infection which is great news overall I am extremely pleased with where things stand today. No fevers, chills, nausea, vomiting, or diarrhea. 8/3; patient presents for 1 week follow-up. He has no issues or complaints today. He denies signs of infection. 03/11/2021 upon evaluation today patient appears to be doing well with regard to his wound. He does have a little bit of slough noted on the surface of the wound  but fortunately there does not appear to be any signs of active infection at this time. No fevers, chills, nausea, vomiting, or diarrhea. 03/18/2021 upon evaluation today patient appears to be doing well with regard to his wound. He has been tolerating the dressing changes without complication. There was a little irritation more proximal to where the wound was that was not noted last week but nonetheless this is very superficial just seems to be more irritation we just need to make sure to put a good amount of the zinc over the area in my opinion. Otherwise he does not seem to be doing significantly worse at all which is great news. 03/25/2021 upon evaluation today patient appears to be doing well with regard to his wound. He is going require some sharp debridement today to clear with some of the necrotic debris. I did perform this today without complication postdebridement wound bed appears to be doing much better this is great news. Electronic Signature(s) Signed: 03/25/2021 9:44:05 AM By: Worthy Keeler PA-C Entered By: Worthy Keeler on 03/25/2021 09:44:05 Chad [name] ([id number]) -------------------------------------------------------------------------------- Physical Exam Details Patient Name: Almyra Brace,  Madsen Date of Service: 03/25/2021 9:00 AM Medical Record Number: [id number] Patient Account Number: [id number] Date of Birth/Sex: 22-Nov-1960 (60 y.o. M) Treating RN: Dolan Amen Primary Care Provider: Clayborn Bigness Other Clinician: Referring Provider: Clayborn Bigness Treating Provider/Extender: Skipper Cliche in Treatment: 65 Constitutional Well-nourished and well-hydrated in no acute distress. Respiratory normal breathing without difficulty. Psychiatric this patient is able to make decisions and demonstrates good insight into disease process. Alert and Oriented x 3. pleasant and cooperative. Notes Upon inspection patient's wound bed showed signs of good granulation epithelization  at this point. Fortunately there does not appear to be any signs of active infection which is great and overall I think that the patient is doing awesome. I did perform debridement to clear away some of the slough and necrotic debris as well as biofilm off the surface of the wound he tolerated that without complication. Electronic Signature(s) Signed: 03/25/2021 9:44:50 AM By: Worthy Keeler PA-C Entered By: Worthy Keeler on 03/25/2021 09:44:50 Chad [name] ([id number]) -------------------------------------------------------------------------------- Physician Orders Details Patient Name: Chad [name] Date of Service: 03/25/2021 9:00 AM Medical Record Number: [id number] Patient Account Number: [id number] Date of Birth/Sex: 08/02/1960 (60 y.o. 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 Bathing/ Shower/ Hygiene o May shower; gently cleanse wound with antibacterial soap, rinse and pat dry prior to dressing wounds Edema Control - Lymphedema / Segmental Compressive Device / Other o Elevate legs to the level of the heart and pump ankles as often as possible o Elevate leg(s) parallel to the floor when sitting. o DO YOUR BEST to sleep in the bed at night. DO NOT sleep in your recliner. Long hours of sitting in a recliner leads  to swelling of the legs and/or potential wounds on your backside. Wound Treatment Wound #8 - Ankle Wound Laterality: Left, Medial Cleanser: Normal Saline 1 x Per Day/30 Days Discharge Instructions: Wash your hands with soap and water. Remove old dressing, discard into plastic bag and place into trash. Cleanse the wound with Normal Saline prior to applying a clean dressing using gauze sponges, not tissues or cotton balls. Do not scrub or use excessive force. Pat dry using gauze sponges, not tissue or cotton balls. Peri-Wound Care: Desitin Maximum Strength Ointment 4 (oz) 1 x Per Day/30 Days Discharge Instructions: Apply zinc periwound Primary Dressing: Prisma 4.34 (in) 1 x Per Day/30 Days Discharge Instructions: Moisten w/normal saline or sterile water; Cover wound bed ONLY. Do not remove from wound bed. Secondary Dressing: ABD Pad 5x9 (in/in) 1 x Per Day/30 Days Discharge Instructions: Cover with ABD pad Secondary Dressing: Kerlix 4.5 x 4.1 (in/yd) 1 x Per Day/30 Days Discharge Instructions: Apply kerlix over ABD pad Secured With: 59M Medipore H Soft Cloth Surgical Tape, 2x2 (in/yd) 1 x Per Day/30 Days Discharge Instructions: Secure kerlix Electronic Signature(s) Signed: 03/25/2021 9:42:50 AM By: Donnamarie Poag Signed: 03/25/2021 5:05:16 PM By: Worthy Keeler PA-C Entered By: Donnamarie Poag on 03/25/2021 09:41:13 Chad [name] ([id number]) -------------------------------------------------------------------------------- Problem List Details Patient  Name: STEVYN, MADDEN Date of Service: 03/25/2021 9:00 AM Medical Record Number: [id number] Patient Account Number: [id number] Date of Birth/Sex: 12/19/1960 (60 y.o. M) Treating RN: Dolan Amen Primary Care Provider: Clayborn Bigness Other Clinician: Referring Provider: Clayborn Bigness Treating Provider/Extender: Skipper Cliche in Treatment: 31 Active Problems ICD-10 Encounter Code Description Active Date MDM Diagnosis I87.2 Venous insufficiency  (chronic) (peripheral) 08/14/2020 No Yes L97.322 Non-pressure chronic ulcer of left ankle with fat layer exposed 08/14/2020 No Yes L97.412 Non-pressure chronic ulcer of right heel and midfoot with fat layer 08/14/2020 No Yes exposed L97.512 Non-pressure chronic ulcer of other part of right foot with fat layer 08/14/2020 No Yes exposed 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 M) Treating RN: Dolan Amen Primary Care Provider: Clayborn Bigness Other Clinician: Referring Provider: Clayborn Bigness Treating Provider/Extender: Skipper Cliche in Treatment: 31 Subjective Chief Complaint Information obtained from Patient Left ankle and right foot and heel ulcers History of Present Illness (HPI) 10/08/18 on evaluation today patient actually presents to our office for initial evaluation concerning wounds that he has of the bilateral lower extremities. 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 He also has bilateral venous stasis. He apparently has been homeless currently following discharge from the hospital apparently he has been placed at [facility] which is is a skilled nursing facility locally. Nonetheless fortunately he does not show any signs of infection at this time which is good news. In fact  several of the wound actually appears to be showing some signs of improvement already in my pinion. There are a couple areas in the left leg in particular there likely gonna require some sharp debridement to help clear away some necrotic tissue and help with more sufficient healing. No fevers, chills, nausea, or vomiting noted at this time. 10/15/18 on evaluation today patient actually appears to be doing very well in regard to his bilateral lower extremities. He's been tolerating the dressing changes without complication. Fortunately there does not appear to be any evidence of active infection at this time which is great news. Overall I'm actually very pleased with how this has progressed in just one visits time. Readmission: 08/14/2020 upon evaluation today patient presents for re-evaluation here in our clinic. He is having issues with his left ankle region as well as his right toe and his right heel. He tells me that the toe and heel actually began as a area that was itching that he was scratching and then subsequently opened up into wounds. These may have been abscess areas I presume based on what I am seeing currently. With regard to his left ankle region he tells me this was a similar type occurrence although he does have venous stasis this very well may be more of a venous leg ulcer more than anything. Nonetheless I do believe that the patient would benefit from appropriate and aggressive wound care to try to help get things under better control here. He does have history of a stroke on the left side affecting him  to some degree there that he is able to stand although he does have some residual weakness. Otherwise again the patient does have chronic venous insufficiency as previously noted. His arterial studies most recently obtained showed that he had an ABI on the right of 1.16 with a TBI of 0.52 and on the left and ABI of 1.14 with a TBI of 0.81. That was obtained on 06/19/2020. 08/28/2020  upon evaluation today patient appears to be doing decently well in regard to his wounds in general. He has been tolerating the dressing changes without complication. Fortunately there does not appear to be any signs of active infection which is great news. With that being said I think the [facility] is doing a good job I would recommend that we likely continue with that currently. 09/11/2020 upon evaluation today patient's wounds did not appear to be doing too poorly but again he is not really showing signs of significant improvement with regard to any of the wounds on the right. None of them have Hydrofera Blue on them I am not exactly sure why this is not being followed as the facility did not contact us to let us know of any issues with obtaining dressings or otherwise. With that being said he is supposed to be using Hydrofera Blue on both of the wounds on the right foot as well as the ankle wound on the left side. 09/18/2020 upon evaluation today patient appears to be doing poorly with regard to his wounds. Again right now the left ankle in particular showed signs of extreme maceration. Apparently he was told by someone with staff at [facility] they could not get the [facility]. With that being said this is something that is never been relayed to [place] one way or another. Also the patient subsequently has not supposed to have a border gauze dressing on. He should have an ABD pad and roll gauze to secure as this drains much too much just to have a border gauze dressing to cover. Nonetheless the fact that they are not using the appropriate dressing is directly causing deterioration of the left ankle wound it is significantly worse today compared to what it was previous. I did attempt to call [facility] while the patient was here I called three times and got no one to even pick up the phone. After this I had my for an office coordinator call and she was able to finally get through  and leave a message with the D ON as of dictation of this note which is roughly about an hour and a half later I still have not been able to speak with anyone at the facility. 09/25/2020 upon evaluation today patient actually showing signs of good improvement which is excellent news. He has been tolerating the dressing changes without complication. Fortunately there is no signs of active infection which is great news. No fevers, chills, nausea, vomiting, or diarrhea. I do feel like the facility has been doing a much better job at taking care of him as far as the dressings are concerned. However the director of nursing never did call me back. 10/09/2020 upon evaluation today patient appears to be doing well with regard to his wound. The toe ulcer did require some debridement but the other 2 areas actually appear to be doing quite well. 10/19/2020 upon evaluation today patient actually appears to be doing very well in regard to his wounds. In fact the heel does appear to be completely healed. The toe is  doing better in the medial ankle on the left is also doing better. Overall I think he is headed in the right direction. 10/26/2020 upon evaluation today patient appears to be doing well with regard to his wound. He is showing signs of improvement which is great news and overall I am very pleased with where things stand today. No fevers, chills, nausea, vomiting, or diarrhea. 11/02/2020 upon evaluation today patient appears to be doing well with regard to his wounds. He has been tolerating the dressing changes without complication overall I am extremely pleased with where things stand today. He in regard to the toe is almost completely healed and the medial [facility] [place] ([id number]) ankle on the left is doing much better. 11/09/2020 upon evaluation today patient appears to be doing a little poorly in regard to his left medial ankle ulcer. Fortunately there does not appear to be any signs of systemic infection  but unfortunately locally he does appear to be infected in fact he has blue-green drainage consistent with Pseudomonas. 11/16/2020 upon evaluation today patient appears to be doing well with regard to his wound. It actually appears to be doing better. I did place him on gentamicin cream since the Cipro was actually resistant even though he was positive for Pseudomonas on culture. Overall I think that he does seem to be doing better though I am unsure whether or not they have actually been putting the cream on. The patient is not sure that we did talk to the nurse directly and she was going to initiate that treatment. Fortunately there does not appear to be any signs of active infection at this time. No fevers, chills, nausea, vomiting, or diarrhea. 4/28; the area on the right second toe is close to healed. Left medial ankle required debridement 12/07/2020 upon evaluation today patient appears to be doing well with regard to his wounds. In fact the right second toe appears to be completely healed which is great news. Fortunately there does not appear to be any signs of active infection at this time which is also great news. I think we can probably discontinue the gentamicin on top of everything else. 12/14/2020 upon evaluation today patient appears to be doing well with regard to his wound. He is making good progress and overall very pleased with where things stand today. There is no signs of active infection at this time which is great news. 12/28/2020 upon evaluation today patient appears to be doing well with regard to his wounds. He has been tolerating the dressing changes without complication. Fortunately there is no signs of active infection at this time. No fevers, chills, nausea, vomiting, or diarrhea. 12/28/2020 upon evaluation today patient's wound bed actually showed signs of excellent improvement. He has great epithelization and granulation I do not see any signs of infection overall I am  extremely pleased with where things stand at this point. No fevers, chills, nausea, vomiting, or diarrhea. 01/11/2021 upon evaluation today patient appears to be doing well with regard to his wound on his leg. He has been tolerating the dressing changes without complication. Fortunately there does not appear to be any signs of active infection which is great news. No fevers, chills, nausea, vomiting, or diarrhea. 01/25/2021 upon evaluation today patient appears to be doing well with regard to his wound. He has been tolerating the dressing changes without complication. Fortunately the collagen seems to be doing a great job which is excellent news. No fevers, chills, nausea, vomiting, or diarrhea. 02/08/2021 upon evaluation today patient's  wound is actually looking a little bit worse especially in the periwound compared to previous. Fortunately there does not appear to be any signs of infection which is great news with that being said he does have some irritation around the periphery of the wound which has me more concerned. He actually had a dressing on that had not been changed in 3 days. He also is supposed to have daily dressing changes. With regard to the dressing applied he had a silver alginate dressing and silver collagen is what is recommended and ordered. He also had no Desitin around the edges of the wound in the periwound region although that is on the order inspect to be done as well. In general I was very concerned I did contact [facility] actually spoke with Magda Paganini who is the scheduling individual and subsequently she stated that she would pass the information to the D ON apparently the D ON was not available to talk to me when I call today. 02/18/2021 upon evaluation today patient's wound is actually showing signs of improvement. Fortunately there does not appear to be any evidence of infection which is great news overall I am extremely pleased with where things stand today. No  fevers, chills, nausea, vomiting, or diarrhea. 8/3; patient presents for 1 week follow-up. He has no issues or complaints today. He denies signs of infection. 03/11/2021 upon evaluation today patient appears to be doing well with regard to his wound. He does have a little bit of slough noted on the surface of the wound but fortunately there does not appear to be any signs of active infection at this time. No fevers, chills, nausea, vomiting, or diarrhea. 03/18/2021 upon evaluation today patient appears to be doing well with regard to his wound. He has been tolerating the dressing changes without complication. There was a little irritation more proximal to where the wound was that was not noted last week but nonetheless this is very superficial just seems to be more irritation we just need to make sure to put a good amount of the zinc over the area in my opinion. Otherwise he does not seem to be doing significantly worse at all which is great news. 03/25/2021 upon evaluation today patient appears to be doing well with regard to his wound. He is going require some sharp debridement today to clear with some of the necrotic debris. I did perform this today without complication postdebridement wound bed appears to be doing much better this is great news. Objective Constitutional Well-nourished and well-hydrated in no acute distress. Vitals Time Taken: 9:08 AM, Height: 67 in, Weight: 160 lbs, BMI: 25.1, Temperature: 98.1 F, Pulse: 76 bpm, Respiratory Rate: 16 breaths/min, Blood Pressure: 118/78 mmHg. Respiratory Stucker, Amram ([id number]) normal breathing without difficulty. Psychiatric this patient is able to make decisions and demonstrates good insight into disease process. Alert and Oriented x 3. pleasant and cooperative. General Notes: Upon inspection patient's wound bed showed signs of good granulation epithelization at this point. Fortunately there does not appear to be any signs of active  infection which is great and overall I think that the patient is doing awesome. I did perform debridement to clear away some of the slough and necrotic debris as well as biofilm off the surface of the wound he tolerated that without complication. Integumentary (Hair, Skin) Wound #8 status is Open. Original cause of wound was Gradually Appeared. The date acquired was: 07/12/2019. The wound has been in treatment 31 weeks. The wound is located on the  Left,Medial Ankle. The wound measures 2cm length x 1.5cm width x 0.1cm depth; 2.356cm^2 area and 0.236cm^3 volume. There is Fat Layer (Subcutaneous Tissue) exposed. There is no tunneling or undermining noted. There is a medium amount of serosanguineous drainage noted. The wound margin is flat and intact. There is small (1-33%) pink granulation within the wound bed. There is a large (67-100%) amount of necrotic tissue within the wound bed including Adherent Slough. 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 There was a Excisional Skin/Subcutaneous Tissue Debridement with a total area of 3 sq cm performed by Tommie Sams., PA-C. With the following instrument(s): Curette Material removed includes Subcutaneous Tissue, Slough, and Biofilm after achieving pain control using Lidocaine. A time out was conducted at 09:38, prior to the start of the procedure. A Minimum amount of bleeding was controlled with Pressure. The procedure was tolerated well. Post  Debridement Measurements: 2cm length x 1.5cm width x 0.1cm depth; 0.236cm^3 volume. Character of Wound/Ulcer Post Debridement is improved. Severity of Tissue Post Debridement is: Fat layer exposed. Post procedure Diagnosis Wound #8: Same as Pre-Procedure Plan Follow-up Appointments: Return Appointment in 2 weeks. Bathing/ Shower/ Hygiene: May shower; gently cleanse wound with antibacterial soap, rinse and pat dry prior to dressing wounds Edema Control - Lymphedema / Segmental Compressive Device / Other: Elevate legs to the level of the heart and pump ankles as often as possible Elevate leg(s) parallel to the floor when sitting. DO YOUR BEST to sleep in the bed at night. DO NOT sleep in your recliner. Long hours of sitting in a recliner leads to swelling of the legs and/or potential wounds on your backside. WOUND #8: - Ankle Wound Laterality: Left, Medial Cleanser: Normal Saline 1 x Per Day/30 Days Discharge Instructions: Wash your hands with soap and water. Remove old dressing, discard into plastic bag and place into trash. Cleanse the wound with Normal Saline prior to applying a clean dressing using gauze sponges, not tissues or cotton balls. Do not scrub or use excessive force. Pat dry using gauze sponges, not tissue or cotton balls. Peri-Wound Care: Desitin Maximum Strength Ointment 4 (oz) 1 x Per Day/30 Days Discharge Instructions: Apply zinc periwound Primary Dressing: Prisma 4.34 (in) 1 x Per Day/30 Days Discharge Instructions: Moisten w/normal saline or sterile water; Cover wound bed ONLY. Do not remove from wound bed. Secondary Dressing: ABD Pad 5x9 (in/in) 1 x Per Day/30 Days Discharge Instructions: Cover with ABD pad Secondary Dressing: Kerlix 4.5 x 4.1 (in/yd) 1 x Per Day/30 Days BRONTE, DEWATER ([id number]) Discharge Instructions: Apply kerlix over ABD pad Secured With: 71M [place] [employer] Tape, 2x2 (in/yd) 1 x Per Day/30 Days Discharge Instructions: Secure  kerlix 1. Would recommend currently that we going to continue with the wound care measures as before and the patient is in agreement the plan. This includes the use of an ABD pad over top of the silver collagen which I think is doing a great job with putting zinc around the edges of the wound. 2. I am also going to recommend that we go ahead  and use the Curlex to secure in place. 3. I am also going to suggest that the patient continue to monitor for any signs of worsening fainting changes to let me know. We will see patient back for reevaluation in 2 weeks here in the clinic. If anything worsens or changes patient will contact our office for additional recommendations. Electronic Signature(s) Signed: 03/25/2021 9:45:32 AM By: Worthy Keeler PA-C Entered By: Worthy Keeler on 03/25/2021 09:45:32 Chad [name] ([id number]) -------------------------------------------------------------------------------- SuperBill Details Patient Name: Chad [name] Date of Service: 03/25/2021 Medical Record Number: [id number] Patient Account Number: [id number] Date of Birth/Sex: 1960-11-14 (60 y.o. M) Treating RN: Donnamarie Poag Primary Care Provider: Clayborn Bigness Other Clinician: Referring Provider: Clayborn Bigness Treating Provider/Extender: Skipper Cliche in Treatment: 31 Diagnosis Coding ICD-10 Codes Code Description I87.2 Venous insufficiency (chronic) (peripheral) L97.322 Non-pressure chronic ulcer of left ankle with fat layer exposed L97.412 Non-pressure chronic ulcer of right heel and midfoot with fat layer exposed L97.512 Non-pressure chronic ulcer of other part of right foot with fat layer exposed 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 side Facility Procedures CPT4 Code: [id number] Description: 11042 - DEB SUBQ TISSUE 20 SQ CM/< Modifier: Quantity: 1 CPT4 Code: Description: ICD-10 Diagnosis Description L97.322 Non-pressure chronic ulcer of  left ankle with fat layer exposed Modifier: Quantity: Physician Procedures CPT4 Code: [id number] Description: 11042 - WC PHYS SUBQ TISS 20 SQ CM Modifier: Quantity: 1 CPT4 Code: Description: ICD-10 Diagnosis Description L97.322 Non-pressure chronic ulcer of left ankle with fat layer exposed Modifier: Quantity: Electronic Signature(s) Signed: 03/25/2021 9:45:44 AM By: Worthy Keeler PA-C Previous Signature: 03/25/2021 9:42:50 AM Version By: Donnamarie Poag Entered By: Worthy Keeler on 03/25/2021 09:45:44

## 2021-04-08 ENCOUNTER — Other Ambulatory Visit: Payer: Self-pay

## 2021-04-08 ENCOUNTER — Encounter: Payer: Medicaid Other | Attending: Physician Assistant | Admitting: Physician Assistant

## 2021-04-08 DIAGNOSIS — Z5900 Homelessness unspecified: Secondary | ICD-10-CM | POA: Insufficient documentation

## 2021-04-08 DIAGNOSIS — J449 Chronic obstructive pulmonary disease, unspecified: Secondary | ICD-10-CM | POA: Diagnosis not present

## 2021-04-08 DIAGNOSIS — L97512 Non-pressure chronic ulcer of other part of right foot with fat layer exposed: Secondary | ICD-10-CM | POA: Diagnosis not present

## 2021-04-08 DIAGNOSIS — I872 Venous insufficiency (chronic) (peripheral): Secondary | ICD-10-CM | POA: Diagnosis not present

## 2021-04-08 DIAGNOSIS — L97322 Non-pressure chronic ulcer of left ankle with fat layer exposed: Secondary | ICD-10-CM | POA: Diagnosis not present

## 2021-04-08 DIAGNOSIS — B1920 Unspecified viral hepatitis C without hepatic coma: Secondary | ICD-10-CM | POA: Diagnosis not present

## 2021-04-08 DIAGNOSIS — I69354 Hemiplegia and hemiparesis following cerebral infarction affecting left non-dominant side: Secondary | ICD-10-CM | POA: Diagnosis not present

## 2021-04-08 DIAGNOSIS — L97412 Non-pressure chronic ulcer of right heel and midfoot with fat layer exposed: Secondary | ICD-10-CM | POA: Insufficient documentation

## 2021-04-08 NOTE — Progress Notes (Addendum)
Chad [name] ([id number]) Visit Report for 04/08/2021 Chief Complaint Document Details Patient Name: Chad [name], Chad [name] Date of Service: 04/08/2021 9:00 AM Medical Record Number: [id number] Patient Account Number: [id number] Date of Birth/Sex: 11-28-1960 (60 y.o. M) Treating RN: Dolan Amen Primary Care Provider: Clayborn Bigness Other Clinician: Referring Provider: Clayborn Bigness Treating Provider/Extender: Skipper Cliche in Treatment: 33 Information Obtained from: Patient Chief Complaint Left ankle and right foot and heel ulcers Electronic Signature(s) Signed: 04/08/2021 9:49:53 AM By: Worthy Keeler PA-C Entered By: Worthy Keeler on 04/08/2021 09:49:52 Chad [name] ([id number]) -------------------------------------------------------------------------------- Debridement Details Patient Name: Chad [name] Date of Service: 04/08/2021 9:00 AM Medical Record Number: [id number] Patient Account Number: [id number] Date of Birth/Sex: 1961-04-12 (60 y.o. M) Treating RN: Dolan Amen Primary Care Provider: Clayborn Bigness Other Clinician: Referring Provider: Clayborn Bigness Treating Provider/Extender: Skipper Cliche in Treatment: 33 Debridement Performed for Wound #8 Left,Medial Ankle Assessment: Performed By: Physician Tommie Sams., PA-C Debridement Type: Debridement Severity of Tissue Pre Debridement: Fat layer exposed Level of Consciousness (Pre- Awake and Alert procedure): Pre-procedure Verification/Time Out Yes - 09:57 Taken: Start Time: 09:57 Total Area Debrided (L x W): 6 (cm) x 3.5 (cm) = 21 (cm) Tissue and other material Viable, Non-Viable, Slough, Subcutaneous, Biofilm, Slough debrided: Level: Skin/Subcutaneous Tissue Debridement Description: Excisional Instrument: Curette Bleeding: Minimum Hemostasis Achieved: Pressure End Time: 10:02 Response to Treatment: Procedure was tolerated well Level of Consciousness (Post- Awake and Alert procedure): Post  Debridement Measurements of Total Wound Length: (cm) 2 Width: (cm) 1.2 Depth: (cm) 0.2 Volume: (cm) 0.377 Character of Wound/Ulcer Post Debridement: Improved Severity of Tissue Post Debridement: Fat layer exposed Post Procedure Diagnosis Same as Pre-procedure Electronic Signature(s) Signed: 04/08/2021 4:52:35 PM By: Dolan Amen RN Signed: 04/08/2021 6:07:16 PM By: Worthy Keeler PA-C Entered By: Dolan Amen on 04/08/2021 10:03:36 Chad [name] ([id number]) -------------------------------------------------------------------------------- HPI Details Patient Name: Chad [name] Date of Service: 04/08/2021 9:00 AM Medical Record Number: [id number] Patient Account Number: [id number] Date of Birth/Sex: 27-Dec-1960 (60 y.o. M) Treating RN: Dolan Amen Primary Care Provider: Clayborn Bigness Other Clinician: Referring Provider: Clayborn Bigness Treating Provider/Extender: Skipper Cliche in Treatment: 33 History of Present Illness HPI Description: 10/08/18 on evaluation today patient actually presents to our office for initial evaluation concerning wounds that he has of the bilateral lower extremities. 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 He also has bilateral venous stasis. He apparently has been homeless currently following discharge from the hospital apparently he has been placed at [facility] which is is a skilled nursing facility locally. Nonetheless fortunately he does not show any signs of infection at this time which is good news. In fact several of the wound actually appears to be showing some signs of improvement already in my pinion. There are a couple areas in the left leg in particular there likely gonna require some sharp debridement to help clear away some necrotic tissue and help with more sufficient  healing. No fevers, chills, nausea, or vomiting noted at this time. 10/15/18 on evaluation today patient actually appears to be doing very well in regard to his bilateral lower extremities. He's been tolerating the dressing changes without complication. Fortunately there does not appear to be any evidence of active infection at this time which is great news. Overall I'm actually very pleased with how this has progressed in just one visits time.  Readmission: 08/14/2020 upon evaluation today patient presents for re-evaluation here in our clinic. He is having issues with his left ankle region as well as his right toe and his right heel. He tells me that the toe and heel actually began as a area that was itching that he was scratching and then subsequently opened up into wounds. These may have been abscess areas I presume based on what I am seeing currently. With regard to his left ankle region he tells me this was a similar type occurrence although he does have venous stasis this very well may be more of a venous leg ulcer more than anything. Nonetheless I do believe that the patient would benefit from appropriate and aggressive wound care to try to help get things under better control here. He does have history of a stroke on the left side affecting him to some degree there that he is able to stand although he does have some residual weakness. Otherwise again the patient does have chronic venous insufficiency as previously noted. His arterial studies most recently obtained showed that he had an ABI on the right of 1.16 with a TBI of 0.52 and on the left and ABI of 1.14 with a TBI of 0.81. That was obtained on 06/19/2020. 08/28/2020 upon evaluation today patient appears to be doing decently well in regard to his wounds in general. He has been tolerating the dressing changes without complication. Fortunately there does not appear to be any signs of active infection which is great news. With that being said  I think the [facility] is doing a good job I would recommend that we likely continue with that currently. 09/11/2020 upon evaluation today patient's wounds did not appear to be doing too poorly but again he is not really showing signs of significant improvement with regard to any of the wounds on the right. None of them have Hydrofera Blue on them I am not exactly sure why this is not being followed as the facility did not contact us to let us know of any issues with obtaining dressings or otherwise. With that being said he is supposed to be using Hydrofera Blue on both of the wounds on the right foot as well as the ankle wound on the left side. 09/18/2020 upon evaluation today patient appears to be doing poorly with regard to his wounds. Again right now the left ankle in particular showed signs of extreme maceration. Apparently he was told by someone with staff at [facility] they could not get the [facility] With that being said this is something that is never been relayed to [place] one way or another. Also the patient subsequently has not supposed to have a border gauze dressing on. He should have an ABD pad and roll gauze to secure as this drains much too much just to have a border gauze dressing to cover. Nonetheless the fact that they are not using the appropriate dressing is directly causing deterioration of the left ankle wound it is significantly worse today compared to what it was previous. I did attempt to call [facility] while the patient was here I called three times and got no one to even pick up the phone. After this I had my for an office coordinator call and she was able to finally get through and leave a message with the D ON as of dictation of this note which is roughly about an hour and a half later I still have not been able to speak  with anyone at the facility. 09/25/2020 upon evaluation today patient actually showing signs of good improvement which is excellent  news. He has been tolerating the dressing changes without complication. Fortunately there is no signs of active infection which is great news. No fevers, chills, nausea, vomiting, or diarrhea. I do feel like the facility has been doing a much better job at taking care of him as far as the dressings are concerned. However the director of nursing never did call me back. 10/09/2020 upon evaluation today patient appears to be doing well with regard to his wound. The toe ulcer did require some debridement but the other 2 areas actually appear to be doing quite well. 10/19/2020 upon evaluation today patient actually appears to be doing very well in regard to his wounds. In fact the heel does appear to be completely healed. The toe is doing better in the medial ankle on the left is also doing better. Overall I think he is headed in the right direction. 10/26/2020 upon evaluation today patient appears to be doing well with regard to his wound. He is showing signs of improvement which is great news and overall I am very pleased with where things stand today. No fevers, chills, nausea, vomiting, or diarrhea. 11/02/2020 upon evaluation today patient appears to be doing well with regard to his wounds. He has been tolerating the dressing changes without complication overall I am extremely pleased with where things stand today. He in regard to the toe is almost completely healed and the medial ankle on the left is doing much better. 11/09/2020 upon evaluation today patient appears to be doing a little poorly in regard to his left medial ankle ulcer. Fortunately there does not appear to be any signs of systemic infection but unfortunately locally he does appear to be infected in fact he has blue-green drainage consistent with Pseudomonas. Chad [name], Chad [name] ([id number]) 11/16/2020 upon evaluation today patient appears to be doing well with regard to his wound. It actually appears to be doing better. I did place him on  gentamicin cream since the Cipro was actually resistant even though he was positive for Pseudomonas on culture. Overall I think that he does seem to be doing better though I am unsure whether or not they have actually been putting the cream on. The patient is not sure that we did talk to the nurse directly and she was going to initiate that treatment. Fortunately there does not appear to be any signs of active infection at this time. No fevers, chills, nausea, vomiting, or diarrhea. 4/28; the area on the right second toe is close to healed. Left medial ankle required debridement 12/07/2020 upon evaluation today patient appears to be doing well with regard to his wounds. In fact the right second toe appears to be completely healed which is great news. Fortunately there does not appear to be any signs of active infection at this time which is also great news. I think we can probably discontinue the gentamicin on top of everything else. 12/14/2020 upon evaluation today patient appears to be doing well with regard to his wound. He is making good progress and overall very pleased with where things stand today. There is no signs of active infection at this time which is great news. 12/28/2020 upon evaluation today patient appears to be doing well with regard to his wounds. He has been tolerating the dressing changes without complication. Fortunately there is no signs of active infection at this time. No fevers, chills, nausea, vomiting, or  diarrhea. 12/28/2020 upon evaluation today patient's wound bed actually showed signs of excellent improvement. He has great epithelization and granulation I do not see any signs of infection overall I am extremely pleased with where things stand at this point. No fevers, chills, nausea, vomiting, or diarrhea. 01/11/2021 upon evaluation today patient appears to be doing well with regard to his wound on his leg. He has been tolerating the dressing changes without complication.  Fortunately there does not appear to be any signs of active infection which is great news. No fevers, chills, nausea, vomiting, or diarrhea. 01/25/2021 upon evaluation today patient appears to be doing well with regard to his wound. He has been tolerating the dressing changes without complication. Fortunately the collagen seems to be doing a great job which is excellent news. No fevers, chills, nausea, vomiting, or diarrhea. 02/08/2021 upon evaluation today patient's wound is actually looking a little bit worse especially in the periwound compared to previous. Fortunately there does not appear to be any signs of infection which is great news with that being said he does have some irritation around the periphery of the wound which has me more concerned. He actually had a dressing on that had not been changed in 3 days. He also is supposed to have daily dressing changes. With regard to the dressing applied he had a silver alginate dressing and silver collagen is what is recommended and ordered. He also had no Desitin around the edges of the wound in the periwound region although that is on the order inspect to be done as well. In general I was very concerned I did contact [facility] actually spoke with Magda Paganini who is the scheduling individual and subsequently she stated that she would pass the information to the D ON apparently the D ON was not available to talk to me when I call today. 02/18/2021 upon evaluation today patient's wound is actually showing signs of improvement. Fortunately there does not appear to be any evidence of infection which is great news overall I am extremely pleased with where things stand today. No fevers, chills, nausea, vomiting, or diarrhea. 8/3; patient presents for 1 week follow-up. He has no issues or complaints today. He denies signs of infection. 03/11/2021 upon evaluation today patient appears to be doing well with regard to his wound. He does have a little bit of  slough noted on the surface of the wound but fortunately there does not appear to be any signs of active infection at this time. No fevers, chills, nausea, vomiting, or diarrhea. 03/18/2021 upon evaluation today patient appears to be doing well with regard to his wound. He has been tolerating the dressing changes without complication. There was a little irritation more proximal to where the wound was that was not noted last week but nonetheless this is very superficial just seems to be more irritation we just need to make sure to put a good amount of the zinc over the area in my opinion. Otherwise he does not seem to be doing significantly worse at all which is great news. 03/25/2021 upon evaluation today patient appears to be doing well with regard to his wound. He is going require some sharp debridement today to clear with some of the necrotic debris. I did perform this today without complication postdebridement wound bed appears to be doing much better this is great news. 04/08/2021 upon evaluation today patient appears to be doing decently well in regard to his wound although the overall measurement is not significantly smaller  compared to previous. It is gone down a little bit but still the facility continues to not really put the appropriate dressings in place in fact he was supposed to have collagen we think he probably had more of an allergy to At this point. Fortunately there does not appear to be any signs of active infection systemically though locally I do not see anything on initial visualization either as far as erythema or warmth. Electronic Signature(s) Signed: 04/08/2021 10:22:56 AM By: Worthy Keeler PA-C Entered By: Worthy Keeler on 04/08/2021 10:22:56 Chad [name] ([id number]) -------------------------------------------------------------------------------- Physical Exam Details Patient Name: Chad [name] Date of Service: 04/08/2021 9:00 AM Medical Record Number:  [id number] Patient Account Number: [id number] Date of Birth/Sex: [birth year] (60 y.o. M) Treating RN: Dolan Amen Primary Care Provider: Clayborn Bigness Other Clinician: Referring Provider: Clayborn Bigness Treating Provider/Extender: Skipper Cliche in Treatment: 62 Constitutional Well-nourished and well-hydrated in no acute distress. Respiratory normal breathing without difficulty. Psychiatric this patient is able to make decisions and demonstrates good insight into disease process. Alert and Oriented x 3. pleasant and cooperative. Notes Upon inspection patient's wound bed actually showed signs of good granulation for the most part he did have some slough and biofilm buildup which was noted today as well. With that being said we did do pre and post debridement [employer] imaging. This did show that he had both blush as well as cyan fluorescence noted. I did debride away a lot of the area where this I am was noted in the periphery of the wound and postdebridement this appeared to be much better which is great news. In general I think that still there was enough of the blushing pattern to go ahead and proceed with an antibiotic to try to help clear this up specially in regard to how long this wound has been going on. Electronic Signature(s) Signed: 04/08/2021 10:23:45 AM By: Worthy Keeler PA-C Entered By: Worthy Keeler on 04/08/2021 10:23:45 Chad [name] ([id number]) -------------------------------------------------------------------------------- Physician Orders Details Patient Name: Chad [name] Date of Service: 04/08/2021 9:00 AM Medical Record Number: [id number] Patient Account Number: [id number] Date of Birth/Sex: Oct 25, 1960 (60 y.o. 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 Bathing/ Shower/ Hygiene o May shower; gently cleanse wound with antibacterial soap, rinse and pat dry prior to dressing wounds Edema Control - Lymphedema / Segmental Compressive Device / Other o Elevate legs to the level of the heart and pump ankles as often as possible o Elevate leg(s) parallel to the floor when sitting. o DO YOUR BEST to sleep in the bed at night. DO NOT sleep in your recliner. Long hours of sitting in a recliner leads to swelling of the legs and/or potential wounds on your backside. Wound Treatment Wound #8 - Ankle Wound Laterality: Left, Medial Cleanser: Normal Saline 3 x Per Week/30 Days Discharge Instructions: Wash your hands with soap and water. Remove old dressing, discard into plastic bag and place into trash. Cleanse the wound with Normal Saline prior  to applying a clean dressing using gauze sponges, not tissues or cotton balls. Do not scrub or use excessive force. Pat dry using gauze sponges, not tissue or cotton balls. Peri-Wound Care: Desitin Maximum Strength Ointment 4 (oz) 3 x Per Week/30 Days Discharge Instructions: Apply zinc periwound Primary Dressing: Hydrofera Blue Ready Transfer Foam, 2.5x2.5 (in/in) 3 x Per Week/30 Days Discharge Instructions: Apply Hydrofera Blue Ready to wound bed as directed (EXTRA SENT WITH PATIENT, PLEASE USE) Secondary Dressing: ABD Pad 5x9 (in/in) 3 x Per Week/30 Days Discharge Instructions: Cover with ABD pad Secondary Dressing: Kerlix 4.5 x 4.1 (in/yd) 3 x Per  Week/30 Days Discharge Instructions: Apply kerlix over ABD pad Secured With: 45M Medipore H Soft Cloth Surgical Tape, 2x2 (in/yd) 3 x Per Week/30 Days Discharge Instructions: Secure kerlix Electronic Signature(s) Signed: 04/08/2021 4:52:35 PM By: Dolan Amen RN Signed: 04/08/2021 6:07:16 PM By: Worthy Keeler PA-C Entered By: Dolan Amen on 04/08/2021 10:05:05 Chad [name] ([id number]) -------------------------------------------------------------------------------- Problem List Details Patient Name: Chad [name] Date of Service: 04/08/2021 9:00 AM Medical Record Number: [id number] Patient Account Number: [id number] Date of Birth/Sex: 1960/08/16 (60 y.o. M) Treating RN: Dolan Amen Primary Care Provider: Clayborn Bigness Other Clinician: Referring Provider: Clayborn Bigness Treating Provider/Extender: Skipper Cliche in Treatment: 33 Active Problems ICD-10 Encounter Code Description Active Date MDM Diagnosis I87.2 Venous insufficiency (chronic) (peripheral) 08/14/2020 No Yes L97.322 Non-pressure chronic ulcer of left ankle with fat layer exposed 08/14/2020 No Yes L97.412 Non-pressure chronic ulcer of right heel and midfoot with fat layer 08/14/2020 No Yes exposed L97.512 Non-pressure chronic ulcer of other part of right foot with fat layer 08/14/2020 No Yes exposed 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 M) Treating RN: Dolan Amen Primary Care Provider: Clayborn Bigness Other Clinician: Referring Provider: Clayborn Bigness Treating Provider/Extender: Skipper Cliche in Treatment: 33 Subjective Chief Complaint Information obtained from Patient Left ankle and right foot and heel ulcers History of Present Illness (HPI) 10/08/18 on evaluation today patient actually presents to our office for initial evaluation concerning wounds that he has of the bilateral lower extremities. 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 He also has bilateral venous stasis. He apparently has been homeless currently following discharge from the hospital apparently he has been placed at [facility] which is is a skilled nursing facility locally. Nonetheless fortunately he does not show any signs of infection at this time which is good news. In fact several of the wound actually appears to be showing some signs of improvement already in my pinion. There are a couple areas in the left leg in particular there likely gonna require some sharp debridement to help clear away some necrotic tissue and help with more sufficient healing. No fevers, chills, nausea, or vomiting noted at this time. 10/15/18 on evaluation today patient actually appears to be doing very well in regard to his bilateral lower extremities. He's been tolerating the dressing changes without complication. Fortunately there does not appear to be any evidence of active  infection at this time which is great news. Overall I'm actually very pleased with how this has progressed in just one visits time. Readmission: 08/14/2020 upon evaluation today patient presents for re-evaluation here in our clinic. He is having issues with his left ankle region as well as his right toe and his right heel. He tells me that the toe and heel actually  began as a area that was itching that he was scratching and then subsequently opened up into wounds. These may have been abscess areas I presume based on what I am seeing currently. With regard to his left ankle region he tells me this was a similar type occurrence although he does have venous stasis this very well may be more of a venous leg ulcer more than anything. Nonetheless I do believe that the patient would benefit from appropriate and aggressive wound care to try to help get things under better control here. He does have history of a stroke on the left side affecting him to some degree there that he is able to stand although he does have some residual weakness. Otherwise again the patient does have chronic venous insufficiency as previously noted. His arterial studies most recently obtained showed that he had an ABI on the right of 1.16 with a TBI of 0.52 and on the left and ABI of 1.14 with a TBI of 0.81. That was obtained on 06/19/2020. 08/28/2020 upon evaluation today patient appears to be doing decently well in regard to his wounds in general. He has been tolerating the dressing changes without complication. Fortunately there does not appear to be any signs of active infection which is great news. With that being said I think the [facility] is doing a good job I would recommend that we likely continue with that currently. 09/11/2020 upon evaluation today patient's wounds did not appear to be doing too poorly but again he is not really showing signs of significant improvement with regard to any of the wounds on the right. None of them have Hydrofera Blue on them I am not exactly sure why this is not being followed as the facility did not contact us to let us know of any issues with obtaining dressings or otherwise. With that being said he is supposed to be using Hydrofera Blue on both of the wounds on the right foot as well as the ankle wound on the left side. 09/18/2020 upon evaluation  today patient appears to be doing poorly with regard to his wounds. Again right now the left ankle in particular showed signs of extreme maceration. Apparently he was told by someone with staff at [place] they could not get the [facility]. With that being said this is something that is never been relayed to [place] one way or another. Also the patient subsequently has not supposed to have a border gauze dressing on. He should have an ABD pad and roll gauze to secure as this drains much too much just to have a border gauze dressing to cover. Nonetheless the fact that they are not using the appropriate dressing is directly causing deterioration of the left ankle wound it is significantly worse today compared to what it was previous. I did attempt to call [facility] while the patient was here I called three times and got no one to even pick up the phone. After this I had my for an office coordinator call and she was able to finally get through and leave a message with the D ON  as of dictation of this note which is roughly about an hour and a half later I still have not been able to speak with anyone at the facility. 09/25/2020 upon evaluation today patient actually showing signs of good improvement which is excellent news. He has been tolerating the dressing changes without complication. Fortunately there is no signs of active infection which is great news. No fevers, chills, nausea, vomiting, or diarrhea. I do feel like the facility has been doing a much better job at taking care of him as far as the dressings are concerned. However the director of nursing never did call me back. 10/09/2020 upon evaluation today patient appears to be doing well with regard to his wound. The toe ulcer did require some debridement but the other 2 areas actually appear to be doing quite well. 10/19/2020 upon evaluation today patient actually appears to be doing very well in regard to his wounds. In fact the  heel does appear to be completely healed. The toe is doing better in the medial ankle on the left is also doing better. Overall I think he is headed in the right direction. 10/26/2020 upon evaluation today patient appears to be doing well with regard to his wound. He is showing signs of improvement which is great news and overall I am very pleased with where things stand today. No fevers, chills, nausea, vomiting, or diarrhea. 11/02/2020 upon evaluation today patient appears to be doing well with regard to his wounds. He has been tolerating the dressing changes without complication overall I am extremely pleased with where things stand today. He in regard to the toe is almost completely healed and the medial [facility] ([id number]) ankle on the left is doing much better. 11/09/2020 upon evaluation today patient appears to be doing a little poorly in regard to his left medial ankle ulcer. Fortunately there does not appear to be any signs of systemic infection but unfortunately locally he does appear to be infected in fact he has blue-green drainage consistent with Pseudomonas. 11/16/2020 upon evaluation today patient appears to be doing well with regard to his wound. It actually appears to be doing better. I did place him on gentamicin cream since the Cipro was actually resistant even though he was positive for Pseudomonas on culture. Overall I think that he does seem to be doing better though I am unsure whether or not they have actually been putting the cream on. The patient is not sure that we did talk to the nurse directly and she was going to initiate that treatment. Fortunately there does not appear to be any signs of active infection at this time. No fevers, chills, nausea, vomiting, or diarrhea. 4/28; the area on the right second toe is close to healed. Left medial ankle required debridement 12/07/2020 upon evaluation today patient appears to be doing well with regard to his wounds. In fact the  right second toe appears to be completely healed which is great news. Fortunately there does not appear to be any signs of active infection at this time which is also great news. I think we can probably discontinue the gentamicin on top of everything else. 12/14/2020 upon evaluation today patient appears to be doing well with regard to his wound. He is making good progress and overall very pleased with where things stand today. There is no signs of active infection at this time which is great news. 12/28/2020 upon evaluation today patient appears to be doing well with regard to his wounds. He has  been tolerating the dressing changes without complication. Fortunately there is no signs of active infection at this time. No fevers, chills, nausea, vomiting, or diarrhea. 12/28/2020 upon evaluation today patient's wound bed actually showed signs of excellent improvement. He has great epithelization and granulation I do not see any signs of infection overall I am extremely pleased with where things stand at this point. No fevers, chills, nausea, vomiting, or diarrhea. 01/11/2021 upon evaluation today patient appears to be doing well with regard to his wound on his leg. He has been tolerating the dressing changes without complication. Fortunately there does not appear to be any signs of active infection which is great news. No fevers, chills, nausea, vomiting, or diarrhea. 01/25/2021 upon evaluation today patient appears to be doing well with regard to his wound. He has been tolerating the dressing changes without complication. Fortunately the collagen seems to be doing a great job which is excellent news. No fevers, chills, nausea, vomiting, or diarrhea. 02/08/2021 upon evaluation today patient's wound is actually looking a little bit worse especially in the periwound compared to previous. Fortunately there does not appear to be any signs of infection which is great news with that being said he does have some  irritation around the periphery of the wound which has me more concerned. He actually had a dressing on that had not been changed in 3 days. He also is supposed to have daily dressing changes. With regard to the dressing applied he had a silver alginate dressing and silver collagen is what is recommended and ordered. He also had no Desitin around the edges of the wound in the periwound region although that is on the order inspect to be done as well. In general I was very concerned I did contact [facility] actually spoke with Magda Paganini who is the scheduling individual and subsequently she stated that she would pass the information to the D ON apparently the D ON was not available to talk to me when I call today. 02/18/2021 upon evaluation today patient's wound is actually showing signs of improvement. Fortunately there does not appear to be any evidence of infection which is great news overall I am extremely pleased with where things stand today. No fevers, chills, nausea, vomiting, or diarrhea. 8/3; patient presents for 1 week follow-up. He has no issues or complaints today. He denies signs of infection. 03/11/2021 upon evaluation today patient appears to be doing well with regard to his wound. He does have a little bit of slough noted on the surface of the wound but fortunately there does not appear to be any signs of active infection at this time. No fevers, chills, nausea, vomiting, or diarrhea. 03/18/2021 upon evaluation today patient appears to be doing well with regard to his wound. He has been tolerating the dressing changes without complication. There was a little irritation more proximal to where the wound was that was not noted last week but nonetheless this is very superficial just seems to be more irritation we just need to make sure to put a good amount of the zinc over the area in my opinion. Otherwise he does not seem to be doing significantly worse at all which is great  news. 03/25/2021 upon evaluation today patient appears to be doing well with regard to his wound. He is going require some sharp debridement today to clear with some of the necrotic debris. I did perform this today without complication postdebridement wound bed appears to be doing much better this is great news.  04/08/2021 upon evaluation today patient appears to be doing decently well in regard to his wound although the overall measurement is not significantly smaller compared to previous. It is gone down a little bit but still the facility continues to not really put the appropriate dressings in place in fact he was supposed to have collagen we think he probably had more of an allergy to At this point. Fortunately there does not appear to be any signs of active infection systemically though locally I do not see anything on initial visualization either as far as erythema or warmth. Objective Constitutional Well-nourished and well-hydrated in no acute distress. Chad [name], Chad [name] ([id number]) Vitals Time Taken: 9:29 AM, Height: 67 in, Weight: 160 lbs, BMI: 25.1, Temperature: 98.1 F, Pulse: 70 bpm, Respiratory Rate: 18 breaths/min, Blood Pressure: 114/75 mmHg. Respiratory normal breathing without difficulty. Psychiatric this patient is able to make decisions and demonstrates good insight into disease process. Alert and Oriented x 3. pleasant and cooperative. General Notes: Upon inspection patient's wound bed actually showed signs of good granulation for the most part he did have some slough and biofilm buildup which was noted today as well. With that being said we did do pre and post debridement MolecuLight imaging. This did show that he had both blush as well as cyan fluorescence noted. I did debride away a lot of the area where this I am was noted in the periphery of the wound and postdebridement this appeared to be much better which is great news. In general I think that still there was enough  of the blushing pattern to go ahead and proceed with an antibiotic to try to help clear this up specially in regard to how long this wound has been going on. Integumentary (Hair, Skin) Wound #8 status is Open. Original cause of wound was Gradually Appeared. The date acquired was: 07/12/2019. The wound has been in treatment 33 weeks. The wound is located on the Left,Medial Ankle. The wound measures 2cm length x 1.2cm width x 0.1cm depth; 1.885cm^2 area and 0.188cm^3 volume. There is Fat Layer (Subcutaneous Tissue) exposed. There is no tunneling or undermining noted. There is a medium amount of serosanguineous drainage noted. The wound margin is flat and intact. There is large (67-100%) pink granulation within the wound bed. There is a small (1-33%) amount of necrotic tissue within the wound bed including Adherent Slough. 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 There was a Excisional Skin/Subcutaneous Tissue Debridement with a total area of 21 sq cm performed by Tommie Sams., PA-C. With the following instrument(s): Curette to remove Viable and Non-Viable tissue/material. Material removed includes Subcutaneous Tissue, Slough, and Biofilm. A time out was conducted at 09:57, prior to the start of the procedure. A Minimum amount of bleeding was controlled with Pressure. The procedure was tolerated well. Post Debridement Measurements: 2cm length x 1.2cm width x 0.2cm depth; 0.377cm^3  volume. Character of Wound/Ulcer Post Debridement is improved. Severity of Tissue Post Debridement is: Fat layer exposed. Post procedure Diagnosis Wound #8: Same as Pre-Procedure Plan Follow-up Appointments: Return Appointment in 1 week.  Bathing/ Shower/ Hygiene: May shower; gently cleanse wound with antibacterial soap, rinse and pat dry prior to dressing wounds Edema Control - Lymphedema / Segmental Compressive Device / Other: Elevate legs to the level of the heart and pump ankles as often as possible Elevate leg(s) parallel to the floor when sitting. DO YOUR BEST to sleep in the bed at night. DO NOT sleep in your recliner. Long hours of sitting in a recliner leads to swelling of the legs and/or potential wounds on your backside. WOUND #8: - Ankle Wound Laterality: Left, Medial Cleanser: Normal Saline 3 x Per Week/30 Days Discharge Instructions: Wash your hands with soap and water. Remove old dressing, discard into plastic bag and place into trash. Cleanse the wound with Normal Saline prior to applying a clean dressing using gauze sponges, not tissues or cotton balls. Do not scrub or use excessive force. Pat dry using gauze sponges, not tissue or cotton balls. Chad [name], Chad [name] ([id number]) Peri-Wound Care: Desitin Maximum Strength Ointment 4 (oz) 3 x Per Week/30 Days Discharge Instructions: Apply zinc periwound Primary Dressing: Hydrofera Blue Ready Transfer Foam, 2.5x2.5 (in/in) 3 x Per Week/30 Days Discharge Instructions: Apply Hydrofera Blue Ready to wound bed as directed (EXTRA SENT WITH PATIENT, PLEASE USE) Secondary Dressing: ABD Pad 5x9 (in/in) 3 x Per Week/30 Days Discharge Instructions: Cover with ABD pad Secondary Dressing: Kerlix 4.5 x 4.1 (in/yd) 3 x Per Week/30 Days Discharge Instructions: Apply kerlix over ABD pad Secured With: 39M Medipore H Soft Cloth Surgical Tape, 2x2 (in/yd) 3 x Per Week/30 Days Discharge Instructions: Secure kerlix 1. Would recommend currently that we  going to continue with the wound care measures as before with regard to the dressing changes 3 times a week although I do want him to switch to the [facility] and that is something that I went ahead and initiated today as well this should hopefully help with some of the bacterial load. 2. I am also can recommend for this patient we continue with the ABD pad and the Curlex around the edges of the wound. 3. I am also going to suggest that we have the patient continue to be monitored at the facility for any signs of worsening as far as infection is concerned and in general I do believe that the treatment plan currently is good we will see what the imaging looks like with the MolecuLight next week as well following the interventions. 4. I did actually go ahead and start him on Cipro as well to help with the bacteria noted during evaluation today. He will be taking that for next 14 days. We will see patient back for reevaluation in 1 week here in the clinic. If anything worsens or changes patient will contact our office for additional recommendations. MolecuLight Medical Necessity Initial Evaluation of the wound to determine baseline bacterial bioburden Fluorescence bacterial imaging was medically necessary today due to: level Wound(s) Scanned The following wound(s) were scanned with MolecuLight DX Left Ankle MolecuLight Procedure The MolecularLight DX device was cleaned with a disinfectant wipe prior to use., The correct patient profile was confirmed and correct wound was verified., Range finder sensor used to ensure appropriate distance The following was completed: selected between imaging unit and wound bed, Room lights were turned off and the ambient light sensor was checked., Blue circle appeared around the lightbulb., The fluorescence icon was selected. Screen was tapped to enhance focus and the image was captured. Additional drapes were used to ensure adequate darknesso No MolecuLight  Results The results  of todayos scan revealed: Red Colors, Yellow Colors, Cyan Colors, Green Colors The indicated colors were noted in the following areas. In the periphery of the wound As a result of todayos scan, the following treatment plans were put in Initiated antibiotics and changed to [facility] place. Potential ICD-10 Codes B96.5 pseudomonas (Cyan Color), 49.9 Bacterial infection unspecified ICD-10 (Red Color) Electronic Signature(s) Signed: 04/08/2021 10:33:18 AM By: Worthy Keeler PA-C Previous Signature: 04/08/2021 10:25:57 AM Version By: Worthy Keeler PA-C Entered By: Worthy Keeler on 04/08/2021 10:33:17 Chad [name] ([id number]) -------------------------------------------------------------------------------- SuperBill Details Patient Name: Chad [name] Date of Service: 04/08/2021 Medical Record Number: [id number] Patient Account Number: [id number] Date of Birth/Sex: 1960/09/22 (60 y.o. M) Treating RN: Dolan Amen Primary Care Provider: Clayborn Bigness Other Clinician: Referring Provider: Clayborn Bigness Treating Provider/Extender: Skipper Cliche in Treatment: 33 Diagnosis Coding ICD-10 Codes Code Description I87.2 Venous insufficiency (chronic) (peripheral) L97.322 Non-pressure chronic ulcer of left ankle with fat layer exposed L97.412 Non-pressure chronic ulcer of right heel and midfoot with fat layer exposed L97.512 Non-pressure chronic ulcer of other part of right foot with fat layer exposed 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 side Facility Procedures CPT4 Code: [id number] Description: [id number] - DEB SUBQ TISSUE 20 SQ CM/< Modifier: Quantity: 1 CPT4 Code: Description: ICD-10 Diagnosis Description L97.322 Non-pressure chronic ulcer of left ankle with fat layer exposed Modifier: Quantity: CPT4 Code: [id number] Description: [id number] - DEB SUBQ TISS EA ADDL 20CM Modifier: Quantity: 1 CPT4  Code: Description: ICD-10 Diagnosis Description [id number] Non-pressure chronic ulcer of left ankle with fat layer exposed Modifier: Quantity: CPT4 Code: [id number] Description: NONCNTACT RT FLORO WND 1ST STE Modifier: Quantity: 1 CPT4 Code: Description: ICD-10 Diagnosis Description L97.322 Non-pressure chronic ulcer of left ankle with fat layer exposed Modifier: Quantity: Physician Procedures CPT4 [id number] Description: 99214 - WC PHYS LEVEL 4 - EST PT Modifier: 25 Quantity: 1 CPT4 Code: Description: ICD-10 Diagnosis Description I87.2 Venous insufficiency (chronic) (peripheral) L97.322 Non-pressure chronic ulcer of left ankle with fat layer exposed L97.412 Non-pressure chronic ulcer of right heel and midfoot with fat layer expose L97.512  Non-pressure chronic ulcer of other part of right foot with fat layer expo Modifier: d sed Quantity: CPT4 [id number] Description: [id number] - WC PHYS SUBQ TISS 20 SQ CM Modifier: Quantity: 1 CPT4 Code: Description: ICD-10 Diagnosis Description [id number] Non-pressure chronic ulcer of left ankle with fat layer exposed Modifier: Quantity: CPT4 [id number] Description: 11045 - WC PHYS SUBQ TISS EA ADDL 20 CM Modifier: Quantity: 1 CPT4 Code: Description: ICD-10 Diagnosis Description L97.322 Non-pressure chronic ulcer of left ankle with fat layer exposed Modifier: Quantity: CPT4 Code: [id number] Description: HC MOLECULIGHT WND IMG 1ST ANATOMIC SITE Modifier: Quantity: 1 CPT4 Code: Description: ICD-10 Diagnosis Description L97.322 Non-pressure chronic ulcer of left ankle with fat layer exposed Modifier: Quantity: Electronic Signature(s) Signed: 04/08/2021 4:41:00 PM By: Dolan Amen RN Chad [name], Chad [name] ([id number]) Signed: 04/08/2021 6:07:16 PM By: Worthy Keeler PA-C Previous Signature: 04/08/2021 10:33:32 AM Version By: Worthy Keeler PA-C Previous Signature: 04/08/2021 10:26:21 AM Version By: Worthy Keeler PA-C Entered By: Dolan Amen on  04/08/2021 16:40:59

## 2021-04-09 NOTE — Progress Notes (Signed)
MATTOX, BUSCAGLIA ([id number]) Visit Report for 04/08/2021 Arrival Information Details Patient Name: Chad [name], Chad [name] Date of Service: 04/08/2021 9:00 AM Medical Record Number: [id number] Patient Account Number: [id number] Date of Birth/Sex: October 14, 1960 (59 y.o. M) Treating RN: Dolan Amen Primary Care Dupree Givler: Clayborn Bigness Other Clinician: Referring Kyrsten Deleeuw: Clayborn Bigness Treating Yasenia Reedy/Extender: Skipper Cliche in Treatment: 33 Visit Information History Since Last Visit Pain Present Now: No Patient Arrived: Wheel Chair Arrival Time: 09:28 Accompanied By: self Transfer Assistance: None Patient Identification Verified: Yes Secondary Verification Process Completed: Yes Patient Requires Transmission-Based No Precautions: Patient Has Alerts: Yes Patient Alerts: Patient on Blood Thinner ABI right 1.16 ABI left 1.14 Electronic Signature(s) Signed: 04/08/2021 4:52:35 PM By: Dolan Amen RN Entered By: Dolan Amen on 04/08/2021 09:28:11 Chad [name] ([id number]) -------------------------------------------------------------------------------- Complex / Palliative Patient Assessment Details Patient Name: Chad [name] Date of Service: 04/08/2021 9:00 AM Medical Record Number: [id number] Patient Account Number: [id number] Date of Birth/Sex: 08/08/1960 (60 y.o. M) Treating RN: Dolan Amen Primary Care Savino Whisenant: Clayborn Bigness Other Clinician: Referring Korrina Zern: Clayborn Bigness Treating Ansley Mangiapane/Extender: Skipper Cliche in Treatment: 33 Palliative Management Criteria Complex Wound Management Criteria Patient has remarkable or complex co-morbidities requiring medications or treatments that extend wound healing times. Examples: o Diabetes mellitus with chronic renal failure or end stage renal disease requiring dialysis o Advanced or poorly controlled rheumatoid arthritis o Diabetes mellitus and end stage chronic obstructive pulmonary disease o Active cancer with  current chemo- or radiation therapy stroke hx with hemiparesis Care Approach Wound Care Plan: Complex Wound Management Electronic Signature(s) Signed: 04/08/2021 4:45:17 PM By: Dolan Amen RN Signed: 04/08/2021 6:07:16 PM By: Worthy Keeler PA-C Entered By: Dolan Amen on 04/08/2021 16:45:16 Chad [name] ([id number]) -------------------------------------------------------------------------------- Encounter Discharge Information Details Patient Name: Chad [name] Date of Service: 04/08/2021 9:00 AM Medical Record Number: [id number] Patient Account Number: [id number] Date of Birth/Sex: Jun 13, 1961 (60 y.o. M) Treating RN: Dolan Amen Primary Care Keajah Killough: Clayborn Bigness Other Clinician: Referring Abie Cheek: Clayborn Bigness Treating [name] Friedland/Extender: Skipper Cliche in Treatment: 33 Encounter Discharge Information Items Post Procedure Vitals Discharge Condition: Stable Temperature (F): 98.1 Ambulatory Status: Wheelchair Pulse (bpm): 70 Discharge Destination: [place] Respiratory Rate (breaths/min): 18 Orders Sent: Yes Blood Pressure (mmHg): 114/75 Transportation: Private Auto Accompanied By: self Schedule Follow-up Appointment: Yes Clinical Summary of Care: Electronic Signature(s) Signed: 04/08/2021 4:46:13 PM By: Dolan Amen RN Entered By: Dolan Amen on 04/08/2021 16:46:12 Chad [name] ([id number]) -------------------------------------------------------------------------------- Lower Extremity Assessment Details Patient Name: Chad [name] Date of Service: 04/08/2021 9:00 AM Medical Record Number: [id number] Patient Account Number: [id number] Date of Birth/Sex: 10/17/1960 (60 y.o. M) Treating RN: Dolan Amen Primary Care Havyn Ramo: Clayborn Bigness Other Clinician: Referring Constance Whittle: Clayborn Bigness Treating Kynsie Falkner/Extender: Jeri Cos Weeks in Treatment: 33 Edema Assessment Assessed: [Left: Yes] [Right: No] Edema: [Left: N] [Right:  o] Vascular Assessment Pulses: Dorsalis Pedis Palpable: [Left:Yes] Electronic Signature(s) Signed: 04/08/2021 4:52:35 PM By: Dolan Amen RN Entered By: Dolan Amen on 04/08/2021 09:46:16 Chad [name] ([id number]) -------------------------------------------------------------------------------- Multi Wound Chart Details Patient Name: Chad [name] Date of Service: 04/08/2021 9:00 AM Medical Record Number: [id number] Patient Account Number: [id number] Date of Birth/Sex: May 09, 1961 (60 y.o. M) Treating RN: Dolan Amen Primary Care Kala Ambriz: Clayborn Bigness Other Clinician: Referring Pritesh Sobecki: Clayborn Bigness Treating Alter Moss/Extender: Skipper Cliche in Treatment: 33 Vital Signs Height(in): 67 Pulse(bpm): 70 Weight(lbs): 160 Blood Pressure(mmHg): 114/75 Body Mass Index(BMI): 25 Temperature(F): 98.1 Respiratory Rate(breaths/min): 18 Photos: [N/A:N/A] Wound Location: Left, Medial Ankle N/A N/A Wounding Event: Gradually Appeared  N/A N/A Primary Etiology: Venous Leg Ulcer N/A N/A Comorbid History: Anemia, Chronic Obstructive N/A N/A Pulmonary Disease (COPD), Hepatitis C, Received Chemotherapy Date Acquired: 07/12/2019 N/A N/A Weeks of Treatment: 33 N/A N/A Wound Status: Open N/A N/A Measurements L x W x D (cm) 2x1.2x0.1 N/A N/A Area (cm) : 1.885 N/A N/A Volume (cm) : 0.188 N/A N/A % Reduction in Area: 68.00% N/A N/A % Reduction in Volume: 68.10% N/A N/A Classification: Full Thickness Without Exposed N/A N/A Support Structures Exudate Amount: Medium N/A N/A Exudate Type: Serosanguineous N/A N/A Exudate Color: red, brown N/A N/A Wound Margin: Flat and Intact N/A N/A Granulation Amount: Large (67-100%) N/A N/A Granulation Quality: Pink N/A N/A Necrotic Amount: Small (1-33%) N/A N/A Exposed Structures: Fat Layer (Subcutaneous Tissue): N/A N/A Yes Fascia: No Tendon: No Muscle: No Joint: No Bone: No Epithelialization: Medium (34-66%) N/A N/A Chad [name], Chad [name]  ([id number]) Treatment Notes Electronic Signature(s) Signed: 04/08/2021 4:52:35 PM By: Dolan Amen RN Entered By: Dolan Amen on 04/08/2021 09:58:33 Chad [name] ([id number]) -------------------------------------------------------------------------------- Multi-Disciplinary Care Plan Details Patient Name: Chad [name] Date of Service: 04/08/2021 9:00 AM Medical Record Number: [id number] Patient Account Number: [id number] Date of Birth/Sex: 05/11/1961 (60 y.o. M) Treating RN: Dolan Amen Primary Care Roddy Bellamy: Clayborn Bigness Other Clinician: Referring Coda Mathey: Clayborn Bigness Treating Yeraldi Fidler/Extender: Jeri Cos Weeks in Treatment: 60 Active Inactive Electronic Signature(s) Signed: 04/08/2021 4:52:35 PM By: Dolan Amen RN Entered By: Dolan Amen on 04/08/2021 09:58:26 Chad [name] ([id number]) -------------------------------------------------------------------------------- Pain Assessment Details Patient Name: Chad [name] Date of Service: 04/08/2021 9:00 AM Medical Record Number: [id number] Patient Account Number: [id number] Date of Birth/Sex: 10-27-1960 (60 y.o. M) Treating RN: Dolan Amen Primary Care Jasaun Carn: Clayborn Bigness Other Clinician: Referring Analysia Dungee: Clayborn Bigness Treating Marney Treloar/Extender: Skipper Cliche in Treatment: 33 Active Problems Location of Pain Severity and Description of Pain Patient Has Paino No Site Locations Rate the pain. Current Pain Level: 0 Pain Management and Medication Current Pain Management: Notes c/o chronic pain, denies wound pain Electronic Signature(s) Signed: 04/08/2021 4:52:35 PM By: Dolan Amen RN Entered By: Dolan Amen on 04/08/2021 09:30:33 Chad [name] ([id number]) -------------------------------------------------------------------------------- Patient/Caregiver Education Details Patient Name: Chad [name] Date of Service: 04/08/2021 9:00 AM Medical Record Number: [id number] Patient  Account Number: [id number] Date of Birth/Gender: Apr 17, 1961 (60 y.o. M) Treating RN: Dolan Amen Primary Care Physician: Clayborn Bigness Other Clinician: Referring Physician: Clayborn Bigness Treating Physician/Extender: Skipper Cliche in Treatment: 52 Education Assessment Education Provided To: Patient Education Topics Provided Wound/Skin Impairment: Methods: Explain/Verbal Responses: State content correctly Electronic Signature(s) Signed: 04/08/2021 4:52:35 PM By: Dolan Amen RN Entered By: Dolan Amen on 04/08/2021 16:42:36 Chad [name] ([id number]) -------------------------------------------------------------------------------- Wound Assessment Details Patient Name: Chad [name] Date of Service: 04/08/2021 9:00 AM Medical Record Number: [id number] Patient Account Number: [id number] Date of Birth/Sex: 12-07-1960 (60 y.o. M) Treating RN: Dolan Amen Primary Care Sebastiano Luecke: Clayborn Bigness Other Clinician: Referring Jamiesha Victoria: Clayborn Bigness Treating Nalah Macioce/Extender: Skipper Cliche in Treatment: 33 Wound Status Wound Number: 8 Primary Venous Leg Ulcer Etiology: Wound Location: Left, Medial Ankle Wound Open Wounding Event: Gradually Appeared Status: Date Acquired: 07/12/2019 Comorbid Anemia, Chronic Obstructive Pulmonary Disease (COPD), Weeks Of Treatment: 33 History: Hepatitis C, Received Chemotherapy Clustered Wound: No Photos Wound Measurements Length: (cm) 2 Width: (cm) 1.2 Depth: (cm) 0.1 Area: (cm) 1.885 Volume: (cm) 0.188 % Reduction in Area: 68% % Reduction in Volume: 68.1% Epithelialization: Medium (34-66%) Tunneling: No Undermining: No Wound Description Classification: Full Thickness Without Exposed Support Structu Wound Margin: Flat and  Intact Exudate Amount: Medium Exudate Type: Serosanguineous Exudate Color: red, brown res Foul Odor After Cleansing: No Slough/Fibrino Yes Wound Bed Granulation Amount: Large (67-100%) Exposed  Structure Granulation Quality: Pink Fascia Exposed: No Necrotic Amount: Small (1-33%) Fat Layer (Subcutaneous Tissue) Exposed: Yes Necrotic Quality: Adherent Slough Tendon Exposed: No Muscle Exposed: No Joint Exposed: No Chad [name], Chad [name] ([id number]) Bone Exposed: No Treatment Notes Wound #8 (Ankle) Wound Laterality: Left, Medial Cleanser Normal Saline Discharge Instruction: Wash your hands with soap and water. Remove old dressing, discard into plastic bag and place into trash. Cleanse the wound with Normal Saline prior to applying a clean dressing using gauze sponges, not tissues or cotton balls. Do not scrub or use excessive force. Pat dry using gauze sponges, not tissue or cotton balls. Peri-Wound Care Desitin Maximum Strength Ointment 4 (oz) Discharge Instruction: Apply zinc periwound Topical Primary Dressing Hydrofera Blue Ready Transfer Foam, 2.5x2.5 (in/in) Discharge Instruction: Apply Hydrofera Blue Ready to wound bed as directed (EXTRA SENT WITH PATIENT, PLEASE USE) Secondary Dressing ABD Pad 5x9 (in/in) Discharge Instruction: Cover with ABD pad Kerlix 4.5 x 4.1 (in/yd) Discharge Instruction: Apply kerlix over ABD pad Secured With 57M [place] Surgical Tape, 2x2 (in/yd) Discharge Instruction: Secure kerlix Compression Wrap Compression Stockings Add-Ons Electronic Signature(s) Signed: 04/08/2021 4:52:35 PM By: Dolan Amen RN Entered By: Dolan Amen on 04/08/2021 10:14:58 Chad [name] ([id number]) -------------------------------------------------------------------------------- [place] Details Patient Name: Chad [name] Date of Service: 04/08/2021 9:00 AM Medical Record Number: [id number] Patient Account Number: [id number] Date of Birth/Sex: 05-18-1961 (60 y.o. M) Treating RN: Dolan Amen Primary Care Nas Wafer: Clayborn Bigness Other Clinician: Referring Quest Tavenner: Clayborn Bigness Treating [place]/Extender: Skipper Cliche in Treatment:  33 Vital Signs Time Taken: 09:29 Temperature (F): 98.1 Height (in): 67 Pulse (bpm): 70 Weight (lbs): 160 Respiratory Rate (breaths/min): 18 Body Mass Index (BMI): 25.1 Blood Pressure (mmHg): 114/75 Reference Range: 80 - 120 mg / dl Electronic Signature(s) Signed: 04/08/2021 4:52:35 PM By: Dolan Amen RN Entered By: Dolan Amen on 04/08/2021 09:30:07

## 2021-04-10 ENCOUNTER — Inpatient Hospital Stay: Payer: Medicaid Other | Attending: Internal Medicine

## 2021-04-10 ENCOUNTER — Inpatient Hospital Stay: Payer: Medicaid Other

## 2021-04-10 ENCOUNTER — Other Ambulatory Visit: Payer: Self-pay

## 2021-04-10 ENCOUNTER — Inpatient Hospital Stay (HOSPITAL_BASED_OUTPATIENT_CLINIC_OR_DEPARTMENT_OTHER): Payer: Medicaid Other | Admitting: Internal Medicine

## 2021-04-10 ENCOUNTER — Encounter: Payer: Self-pay | Admitting: Internal Medicine

## 2021-04-10 VITALS — BP 121/79 | HR 69

## 2021-04-10 DIAGNOSIS — Z800 Family history of malignant neoplasm of digestive organs: Secondary | ICD-10-CM | POA: Diagnosis not present

## 2021-04-10 DIAGNOSIS — Z79899 Other long term (current) drug therapy: Secondary | ICD-10-CM | POA: Diagnosis not present

## 2021-04-10 DIAGNOSIS — Z7982 Long term (current) use of aspirin: Secondary | ICD-10-CM | POA: Diagnosis not present

## 2021-04-10 DIAGNOSIS — Z1509 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: Secondary | ICD-10-CM

## 2021-04-10 DIAGNOSIS — Z8042 Family history of malignant neoplasm of prostate: Secondary | ICD-10-CM | POA: Diagnosis not present

## 2021-04-10 DIAGNOSIS — Z8051 Family history of malignant neoplasm of kidney: Secondary | ICD-10-CM | POA: Insufficient documentation

## 2021-04-10 DIAGNOSIS — F17210 Nicotine dependence, cigarettes, uncomplicated: Secondary | ICD-10-CM | POA: Diagnosis not present

## 2021-04-10 DIAGNOSIS — Z5112 Encounter for antineoplastic immunotherapy: Secondary | ICD-10-CM | POA: Diagnosis not present

## 2021-04-10 DIAGNOSIS — D509 Iron deficiency anemia, unspecified: Secondary | ICD-10-CM | POA: Insufficient documentation

## 2021-04-10 DIAGNOSIS — C661 Malignant neoplasm of right ureter: Secondary | ICD-10-CM | POA: Insufficient documentation

## 2021-04-10 DIAGNOSIS — Z515 Encounter for palliative care: Secondary | ICD-10-CM

## 2021-04-10 DIAGNOSIS — D500 Iron deficiency anemia secondary to blood loss (chronic): Secondary | ICD-10-CM

## 2021-04-10 DIAGNOSIS — Z806 Family history of leukemia: Secondary | ICD-10-CM | POA: Insufficient documentation

## 2021-04-10 DIAGNOSIS — C182 Malignant neoplasm of ascending colon: Secondary | ICD-10-CM

## 2021-04-10 DIAGNOSIS — Z7189 Other specified counseling: Secondary | ICD-10-CM

## 2021-04-10 LAB — CBC WITH DIFFERENTIAL/PLATELET
Abs Immature Granulocytes: 0.01 10*3/uL (ref 0.00–0.07)
Basophils Absolute: 0.1 10*3/uL (ref 0.0–0.1)
Basophils Relative: 1 %
Eosinophils Absolute: 0.4 10*3/uL (ref 0.0–0.5)
Eosinophils Relative: 6 %
HCT: 35.2 % — ABNORMAL LOW (ref 39.0–52.0)
Hemoglobin: 11 g/dL — ABNORMAL LOW (ref 13.0–17.0)
Immature Granulocytes: 0 %
Lymphocytes Relative: 18 %
Lymphs Abs: 1.2 10*3/uL (ref 0.7–4.0)
MCH: 24.2 pg — ABNORMAL LOW (ref 26.0–34.0)
MCHC: 31.3 g/dL (ref 30.0–36.0)
MCV: 77.4 fL — ABNORMAL LOW (ref 80.0–100.0)
Monocytes Absolute: 0.6 10*3/uL (ref 0.1–1.0)
Monocytes Relative: 8 %
Neutro Abs: 4.4 10*3/uL (ref 1.7–7.7)
Neutrophils Relative %: 67 %
Platelets: 219 10*3/uL (ref 150–400)
RBC: 4.55 MIL/uL (ref 4.22–5.81)
RDW: 15.4 % (ref 11.5–15.5)
WBC: 6.7 10*3/uL (ref 4.0–10.5)
nRBC: 0 % (ref 0.0–0.2)

## 2021-04-10 LAB — COMPREHENSIVE METABOLIC PANEL
ALT: 25 U/L (ref 0–44)
AST: 39 U/L (ref 15–41)
Albumin: 3.7 g/dL (ref 3.5–5.0)
Alkaline Phosphatase: 121 U/L (ref 38–126)
Anion gap: 7 (ref 5–15)
BUN: 20 mg/dL (ref 8–23)
CO2: 24 mmol/L (ref 22–32)
Calcium: 8.6 mg/dL — ABNORMAL LOW (ref 8.9–10.3)
Chloride: 104 mmol/L (ref 98–111)
Creatinine, Ser: 0.9 mg/dL (ref 0.61–1.24)
GFR, Estimated: >60 mL/min (ref >60)
Glucose, Bld: 102 mg/dL — ABNORMAL HIGH (ref 70–99)
Potassium: 4.3 mmol/L (ref 3.5–5.1)
Sodium: 135 mmol/L (ref 135–145)
Total Bilirubin: 0.4 mg/dL (ref 0.3–1.2)
Total Protein: 8.1 g/dL (ref 6.5–8.1)

## 2021-04-10 MED ORDER — IRON SUCROSE 20 MG/ML IV SOLN
200.0000 mg | Freq: Once | INTRAVENOUS | Status: AC
  Administered 2021-04-10: 200 mg via INTRAVENOUS
  Filled 2021-04-10: qty 10

## 2021-04-10 MED ORDER — SODIUM CHLORIDE 0.9 % IV SOLN
Freq: Once | INTRAVENOUS | Status: AC
Start: 2021-04-10 — End: 2021-04-10
  Filled 2021-04-10: qty 250

## 2021-04-10 MED ORDER — HEPARIN SOD (PORK) LOCK FLUSH 100 UNIT/ML IV SOLN
500.0000 [IU] | Freq: Once | INTRAVENOUS | Status: AC | PRN
  Filled 2021-04-10: qty 5

## 2021-04-10 MED ORDER — HEPARIN SOD (PORK) LOCK FLUSH 100 UNIT/ML IV SOLN
INTRAVENOUS | Status: AC
  Administered 2021-04-10: 500 [IU]
  Filled 2021-04-10: qty 5

## 2021-04-10 MED ORDER — SODIUM CHLORIDE 0.9 % IV SOLN
200.0000 mg | Freq: Once | INTRAVENOUS | Status: AC
  Administered 2021-04-10: 200 mg via INTRAVENOUS
  Filled 2021-04-10: qty 8

## 2021-04-10 NOTE — Progress Notes (Signed)
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  Is alone/in a wheelchair.  Vaiden Adames 60 y.o.  male above history of stage IV-ureteral cancer/history of stage III colon cancer right side; recurrent sigmoid colon cancer [un-resected/under surveillance] and multiple other comorbidities currently on Keytruda is here for follow-up.  No nausea no vomiting.  No abdominal pain.  No blood in urine.  Chronic shortness of breath.  Patient awaiting evaluation with physician-for the cyst on his right elbow.   Review of Systems  Constitutional:  Positive for malaise/fatigue. Negative for  chills, diaphoresis, fever and weight loss.  HENT:  Negative for nosebleeds and sore throat.   Eyes:  Negative for double vision.  Respiratory:  Positive for cough and shortness of breath. Negative for hemoptysis and wheezing.   Cardiovascular:  Negative for chest pain, palpitations and orthopnea.  Gastrointestinal:  Positive for constipation. Negative for abdominal pain, blood in stool, diarrhea, heartburn, melena, nausea and vomiting.  Genitourinary:  Negative for dysuria, frequency and urgency.  Musculoskeletal:  Positive for back pain and joint pain.  Skin: Negative.  Negative for itching and rash.  Neurological:  Positive for focal weakness. Negative for dizziness, tingling, weakness and headaches.       Chronic left-sided weakness upper than lower extremity.  Endo/Heme/Allergies:  Does not bruise/bleed easily.  Psychiatric/Behavioral:  Negative for depression. The patient is not nervous/anxious and does not have insomnia.     MEDICAL HISTORY:  Past Medical History:  Diagnosis Date   Anemia    Anxiety    ARF (acute respiratory failure) (HCC)    Bladder cancer (HCC)    COPD (chronic obstructive pulmonary disease) ([place])    Depression    Dysphagia    Family history of colon cancer    Family history of kidney cancer    Family history of leukemia    Family history of prostate cancer    GERD (gastroesophageal reflux disease)    Hepatitis    chronic hep c   Hydronephrosis    Hydronephrosis with ureteral stricture    Hyperlipidemia    Knee pain    Left   Malignant neoplasm of colon ([place])    Nerve pain    Peripheral vascular disease ([place])  Prostate cancer ([place])    Stroke [facility])    Urinary frequency    Venous hypertension of both lower extremities     SURGICAL HISTORY: Past Surgical History:  Procedure Laterality Date   COLON SURGERY     En bloc extended right hemicolectomy 07/2017   COLONOSCOPY WITH PROPOFOL N/A 11/06/2020   Procedure: COLONOSCOPY WITH PROPOFOL;   Surgeon: Jonathon Bellows, MD;  [facility] ENDOSCOPY;  Service: Gastroenterology;  Laterality: N/A;   CYSTOSCOPY W/ RETROGRADES Right 08/30/2018   Procedure: CYSTOSCOPY WITH RETROGRADE PYELOGRAM;  Surgeon: Hollice Espy, MD;  Location: [facility] ORS;  Service: Urology;  Laterality: Right;   CYSTOSCOPY WITH STENT PLACEMENT Right 04/25/2018   Procedure: CYSTOSCOPY WITH STENT PLACEMENT;  Surgeon: Hollice Espy, MD;  Location: [facility] ORS;  Service: Urology;  Laterality: Right;   CYSTOSCOPY WITH STENT PLACEMENT Right 08/30/2018   Procedure: [place];  Surgeon: Hollice Espy, MD;  Location: [facility] ORS;  Service: Urology;  Laterality: Right;   CYSTOSCOPY WITH STENT PLACEMENT Right 03/07/2019   Procedure: CYSTOSCOPY WITH STENT [place];  Surgeon: Hollice Espy, MD;  Location: [facility] ORS;  Service: Urology;  Laterality: Right;   CYSTOSCOPY WITH STENT PLACEMENT Right 11/21/2019   Procedure: CYSTOSCOPY WITH STENT [place];  Surgeon: Hollice Espy, MD;  Location: [facility] ORS;  Service: Urology;  Laterality: Right;   LOWER EXTREMITY ANGIOGRAPHY Left 05/23/2019   Procedure: LOWER EXTREMITY ANGIOGRAPHY;  Surgeon: Algernon Huxley, MD;  Location: [place] CV LAB;  Service: Cardiovascular;  Laterality: Left;   LOWER EXTREMITY ANGIOGRAPHY Right 05/30/2019   Procedure: LOWER EXTREMITY ANGIOGRAPHY;  Surgeon: Algernon Huxley, MD;  Location: [place] CV LAB;  Service: Cardiovascular;  Laterality: Right;   LOWER EXTREMITY ANGIOGRAPHY Right 02/13/2020   Procedure: LOWER EXTREMITY ANGIOGRAPHY;  Surgeon: Algernon Huxley, MD;  Location: [place] CV LAB;  Service: Cardiovascular;  Laterality: Right;   LOWER EXTREMITY ANGIOGRAPHY Left 02/20/2020   Procedure: LOWER EXTREMITY ANGIOGRAPHY;  Surgeon: Algernon Huxley, MD;  Location: [place] CV LAB;  Service: Cardiovascular;  Laterality: Left;   PORTA CATH INSERTION N/A 02/28/2019   Procedure: PORTA CATH INSERTION;  Surgeon: Algernon Huxley, MD;  Location: [place]  CV LAB;  Service: Cardiovascular;  Laterality: N/A;   tumor removed       SOCIAL HISTORY: Social History   Socioeconomic History   Marital status: Single    Spouse name: Not on file   Number of children: Not on file   Years of education: Not on file   Highest education level: Not on file  Occupational History   Not on file  Tobacco Use   Smoking status: Every Day    Packs/day: 1.00    Types: Cigarettes   Smokeless tobacco: Never  Vaping Use   Vaping Use: Never used  Substance and Sexual Activity   Alcohol use: Not Currently   Drug use: Not Currently   Sexual activity: Not Currently  Other Topics Concern   Not on file  Social History Narrative    used to live [place]; moved  To [place]- end of April 2019; in Nursing home; 1pp/day; quit alcohol. 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        Family- dad- prostate ca [at 81y]; brother- 93 died of prostate cancer; brother- 69- no cancers [[place]]; sonGerald Stabs [[place]];[name]-32y prostate ca [facility] [place]]; daughter- 59 [[place]]; another daughter 37 [[place]/addict]. will refer genetics counseling. Given MSI- abnormal; highly suspicious of Lynch syndrome.  Patient's son [name] aware of  high possible lynch syndrome.   Social Determinants of Health   Financial Resource Strain: Not on file  Food Insecurity: Not on file  Transportation Needs: Not on file  Physical Activity: Not on file  Stress: Not on file  Social Connections: Not on file  Intimate Partner Violence: Not on file    FAMILY HISTORY: Family History  Problem Relation Age of Onset   Prostate cancer Father 38   Cancer Brother 72       unsure type   Cancer Paternal Uncle        unsure type   Cancer Maternal Grandmother        unsure type   Cancer Paternal Grandmother        unsure type   Kidney cancer Paternal Grandfather    Cancer Other        unsure types   Leukemia Son    Cancer Son        other cancers, possibly colon    ALLERGIES:  is allergic to  penicillins.  MEDICATIONS:  Current Outpatient Medications  Medication Sig Dispense Refill   acetaminophen (TYLENOL) 325 MG tablet Take 650 mg by mouth 3 (three) times daily.      aspirin EC 81 MG tablet Take 1 tablet (81 mg total) by mouth daily. 150 tablet 2   carboxymethylcellulose 1 % ophthalmic solution Apply 1 drop to eye 2 (two) times daily.     Cholecalciferol (VITAMIN D) 50 MCG (2000 UT) CAPS Take by mouth.     clopidogrel (PLAVIX) 75 MG tablet Take 1 tablet (75 mg total) by mouth daily. 30 tablet 11   cyclobenzaprine (FLEXERIL) 10 MG tablet Take 10 mg by mouth at bedtime.     escitalopram (LEXAPRO) 5 MG tablet Take 5 mg by mouth daily.     gabapentin (NEURONTIN) 100 MG capsule Take 100 mg by mouth 3 (three) times daily.     mirtazapine (REMERON) 7.5 MG tablet Take 7.5 mg by mouth at bedtime.     Oxycodone HCl 10 MG TABS Take 10 mg by mouth every 6 (six) hours as needed for pain.     pantoprazole (PROTONIX) 40 MG tablet Take 40 mg by mouth daily.     RESTORE CALCIUM ALGINATE EX Apply topically. Note: Dressing located on left ankle. Facility cleaning wound "every day shift and applying wound cleanser and appication of calcium Alginate and cover with dry dressing and wrapped with Kerlix"     senna-docusate (SENOKOT-S) 8.6-50 MG tablet Take 1 tablet by mouth daily.     atorvastatin (LIPITOR) 10 MG tablet Take 1 tablet (10 mg total) by mouth daily. 30 tablet 11   No current facility-administered medications for this visit.      Marland Kitchen  PHYSICAL EXAMINATION: ECOG PERFORMANCE STATUS: 1 - Symptomatic but completely ambulatory  Vitals:   04/10/21 0901  BP: 121/83  Pulse: 71  Resp: 16  Temp: (!) 97.4 F (36.3 C)  SpO2: 96%   Filed Weights   04/10/21 0901  Weight: 159 lb 7 oz (72.3 kg)    Physical Exam HENT:     Head: Normocephalic and atraumatic.     Mouth/Throat:     Pharynx: No oropharyngeal exudate.  Eyes:     Pupils: Pupils are equal, round, and reactive to light.   Cardiovascular:     Rate and Rhythm: Normal rate and regular rhythm.  Pulmonary:     Effort: No respiratory distress.     Breath sounds: No wheezing.  Abdominal:  General: Bowel sounds are normal. There is no distension.     Palpations: Abdomen is soft. There is no mass.     Tenderness: There is no abdominal tenderness. There is no guarding or rebound.  Musculoskeletal:        General: No tenderness. Normal range of motion.     Cervical back: Normal range of motion and neck supple.  Skin:    General: Skin is warm.     Comments: Bilateral lower extremity ulcerations noted.  Pulses intact bilaterally.  Cystlike lesion noted right elbow-nontender.  No erythema noted.  Neurological:     Mental Status: He is oriented to person, place, and time.     Comments: Sleepy but easily arousable.  Chronic weakness left upper and lower extremity.  Psychiatric:        Mood and Affect: Affect normal.     LABORATORY DATA:  I have reviewed the data as listed Lab Results  Component Value Date   WBC 6.7 04/10/2021   HGB 11.0 (L) 04/10/2021   HCT 35.2 (L) 04/10/2021   MCV 77.4 (L) 04/10/2021   PLT 219 04/10/2021   Recent Labs    04/19/20 0858 05/10/20 0915 02/20/21 0815 03/20/21 0757 04/10/21 0811  NA 134*   < > 134* 134* 135  K 3.8   < > 4.3 4.3 4.3  CL 101   < > 104 102 104  CO2 26   < > '24 25 24  ' GLUCOSE 140*   < > 88 95 102*  BUN 19   < > 24* 23 20  CREATININE 0.92   < > 0.96 0.96 0.90  CALCIUM 8.4*   < > 8.4* 8.5* 8.6*  GFRNONAA >60   < > >60 >60 >60  GFRAA >60  --   --   --   --   PROT 7.8   < > 7.5 8.3* 8.1  ALBUMIN 3.7   < > 3.7 3.7 3.7  AST 33   < > 35 30 39  ALT 24   < > '21 17 25  ' ALKPHOS 115   < > 102 109 121  BILITOT 0.4   < > 0.3 0.4 0.4   < > = values in this interval not displayed.    RADIOGRAPHIC STUDIES: I have personally reviewed the radiological images as listed and agreed with the findings in the report. No results found.  ASSESSMENT & PLAN:    Ureteral cancer, right ([place]) # High-grade urothelial cancer/cytology; likely of the right renal pelvis /upper ureter. JAN 5th, 2022- CT findings worrisome for progressive right renal pelvis and ureteral tumor and mass suspicious for colonic neoplasm involving the descending colon sigmoid colon junction region [see below].  Mild soft tissue thickening around the distal right ureter could reflect ureteral tumor; FEB 2022-  RIGHT renal pelvis and bladder are not evaluable by FDG PET imaging due to high activity of radiotracer within the urine-STABLE.   but see below re: sigmoid mass/colon cancer. STABLE.   # proceed with  [place] today; Labs today reviewed; Labs today reviewed;  acceptable for treatment today.  We will repeat imaging prior to next visit.  #Descending colon mass- PET scan- FEB 2022-uptake in the cecum;  [history of Lynch syndrome]-colonoscopy 25 mm polyp; biopsy mucinous carcinoma- given risk vs benefits -hold surgery for now; re-eval with colonoscopy in October- STABLE  # Iron deficiency anemia-hemoglobin 11.5; February 2022 ron studies/ferritin-LOW; hold Venofer with infusions- STABLE  # Bilateral LE ulcers-s/p wound care evaluation;  s/p PTCA with Dr.Dew. bil Arterial Dopp-wnl- STABLE.   # Right elbow bursitis:? Awaiting excision tomorrow.  No contraindications from oncology standpoint.  #DISPOSITION: #  Keytruda; venofer todya # follow up in 3 weeks-;MD labs-cbc/cmp;  Keytruda; possible venofer; CT CAP-prior- Dr.B.   All questions were answered. The patient knows to call the clinic with any problems, questions or concerns.    Cammie Sickle, MD 04/10/2021 8:35 PM

## 2021-04-10 NOTE — Assessment & Plan Note (Addendum)
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 JAN 5th, 2022- CT findings worrisome for progressive right renal pelvis and ureteral tumor and mass suspicious for colonic neoplasm involving the descending colon sigmoid colon junction region [see below].  Mild soft tissue thickening around the distal right ureter could reflect ureteral tumor; FEB 2022-  RIGHT renal pelvis and bladder are not evaluable by FDG PET imaging due to high activity of radiotracer within the urine-STABLE.   but see below re: sigmoid mass/colon cancer. STABLE.   # proceed with  [place] today; Labs today reviewed; Labs today reviewed;  acceptable for treatment today.  We will repeat imaging prior to next visit.  #Descending colon mass- PET scan- FEB 2022-uptake in the cecum;  [history of Lynch syndrome]-colonoscopy 25 mm polyp; biopsy mucinous carcinoma- given risk vs benefits -hold surgery for now; re-eval with colonoscopy in October- STABLE  # Iron deficiency anemia-hemoglobin 11.5; February 2022 ron studies/ferritin-LOW; hold Venofer with infusions- STABLE  # Bilateral LE ulcers-s/p wound care evaluation; s/p PTCA with Dr.Dew. bil Arterial Dopp-wnl- STABLE.   # Right elbow bursitis:? Awaiting excision tomorrow.  No contraindications from oncology standpoint.  #DISPOSITION: #  Keytruda; venofer todya # follow up in 3 weeks-;MD labs-cbc/cmp;  Keytruda; possible venofer; CT CAP-prior- Dr.B.

## 2021-04-10 NOTE — Patient Instructions (Signed)
[facility]COL[facility]  Discharge Instructions: Thank you for choosing [facility] to provide your oncology and hematology care.  If you have a lab appointment with the [facility], please go directly to the [facility] and check in at the registration area.  Wear comfortable clothing and clothing appropriate for easy access to any Portacath or PICC line.   We strive to give you quality time with your provider. You may need to reschedule your appointment if you arrive late (15 or more minutes).  Arriving late affects you and other patients whose appointments are after yours.  Also, if you miss three or more appointments without notifying the office, you may be dismissed from the clinic at the provider's discretion.      For prescription refill requests, have your pharmacy contact our office and allow 72 hours for refills to be completed.    Today you received the following chemotherapy and/or immunotherapy agents Keytruda & venofer   To help prevent nausea and vomiting after your treatment, we encourage you to take your nausea medication as directed.  BELOW ARE SYMPTOMS THAT SHOULD BE REPORTED IMMEDIATELY: *FEVER GREATER THAN 100.4 F (38 C) OR HIGHER *CHILLS OR SWEATING *NAUSEA AND VOMITING THAT IS NOT CONTROLLED WITH YOUR NAUSEA MEDICATION *UNUSUAL SHORTNESS OF BREATH *UNUSUAL BRUISING OR BLEEDING *URINARY PROBLEMS (pain or burning when urinating, or frequent urination) *BOWEL PROBLEMS (unusual diarrhea, constipation, pain near the anus) TENDERNESS IN MOUTH AND THROAT WITH OR WITHOUT PRESENCE OF ULCERS (sore throat, sores in mouth, or a toothache) UNUSUAL RASH, SWELLING OR PAIN  UNUSUAL VAGINAL DISCHARGE OR ITCHING   Items with * indicate a potential emergency and should be followed up as soon as possible or go to the Emergency Department if any problems should occur.  Please show the CHEMOTHERAPY ALERT CARD or IMMUNOTHERAPY ALERT CARD at  check-in to the Emergency Department and triage nurse.  Should you have questions after your visit or need to cancel or reschedule your appointment, please contact [facility]  [phone] and follow the prompts.  Office hours are 8:00 a.m. to 4:30 p.m. Monday - Friday. Please note that voicemails left after 4:00 p.m. may not be returned until the following business day.  We are closed weekends and major holidays. You have access to a nurse at all times for urgent questions. Please call the main number to the clinic [phone] and follow the prompts.  For any non-urgent questions, you may also contact your provider using MyChart. We now offer e-Visits for anyone 47 and older to request care online for non-urgent symptoms. For details visit [email or web address].   Also download the MyChart app! Go to the app store, search "MyChart", open the app, select [facility], and log in with your MyChart username and password.  Due to Covid, a mask is required upon entering the hospital/clinic. If you do not have a mask, one will be given to you upon arrival. For doctor visits, patients may have 1 support person aged 74 or older with them. For treatment visits, patients cannot have anyone with them due to current Covid guidelines and our immunocompromised population.

## 2021-04-15 ENCOUNTER — Other Ambulatory Visit: Payer: Self-pay

## 2021-04-15 ENCOUNTER — Encounter: Payer: Medicaid Other | Admitting: Physician Assistant

## 2021-04-15 DIAGNOSIS — L97322 Non-pressure chronic ulcer of left ankle with fat layer exposed: Secondary | ICD-10-CM | POA: Diagnosis not present

## 2021-04-15 NOTE — Progress Notes (Addendum)
EUSEBIO, STUTZMAN ([id number]) Visit Report for 04/15/2021 Arrival Information Details Patient Name: Chad [name], Chad [name] Date of Service: 04/15/2021 2:30 PM Medical Record Number: [id number] Patient Account Number: [id number] Date of Birth/Sex: [birth year] (60 y.o. M) Treating RN: Cornell Barman Primary Care Jaliel Deavers: Clayborn Bigness Other Clinician: Referring Geneen Dieter: Clayborn Bigness Treating Jamyrah Saur/Extender: Skipper Cliche in Treatment: 34 Visit Information History Since Last Visit Pain Present Now: No Patient Arrived: Wheel Chair Arrival Time: 14:57 Accompanied By: self Transfer Assistance: None Patient Identification Verified: Yes Secondary Verification Process Completed: Yes Patient Requires Transmission-Based No Precautions: Patient Has Alerts: Yes Patient Alerts: Patient on Blood Thinner ABI right 1.16 ABI left 1.14 Electronic Signature(s) Signed: 04/15/2021 4:05:19 PM By: Gretta Cool, BSN, RN, CWS, Kim RN, BSN Entered By: Gretta Cool, BSN, RN, CWS, Kim on 04/15/2021 14:58:17 Chad [name] ([id number]) -------------------------------------------------------------------------------- Clinic Level of Care Assessment Details Patient Name: Chad [name] Date of Service: 04/15/2021 2:30 PM Medical Record Number: [id number] Patient Account Number: [id number] Date of Birth/Sex: 1961/01/13 (60 y.o. M) Treating RN: Donnamarie Poag Primary Care Anastashia Westerfeld: Clayborn Bigness Other Clinician: Referring Peyson Delao: Clayborn Bigness Treating Leander Tout/Extender: Skipper Cliche in Treatment: 34 Clinic Level of Care Assessment Items TOOL 1 Quantity Score '[]'$  - Use when EandM and Procedure is performed on INITIAL visit 0 ASSESSMENTS - Nursing Assessment / Reassessment '[]'$  - General Physical Exam (combine w/ comprehensive assessment (listed just below) when performed on new 0 pt. evals) '[]'$  - 0 Comprehensive Assessment (HX, ROS, Risk Assessments, Wounds Hx, etc.) ASSESSMENTS - Wound and Skin Assessment / Reassessment '[]'$  -  Dermatologic / Skin Assessment (not related to wound area) 0 ASSESSMENTS - Ostomy and/or Continence Assessment and Care '[]'$  - Incontinence Assessment and Management 0 '[]'$  - 0 Ostomy Care Assessment and Management (repouching, etc.) PROCESS - Coordination of Care '[]'$  - Simple Patient / Family Education for ongoing care 0 '[]'$  - 0 Complex (extensive) Patient / Family Education for ongoing care '[]'$  - 0 Staff obtains Programmer, systems, Records, Test Results / Process Orders '[]'$  - 0 Staff telephones HHA, Nursing Homes / Clarify orders / etc '[]'$  - 0 Routine Transfer to another Facility (non-emergent condition) '[]'$  - 0 Routine Hospital Admission (non-emergent condition) '[]'$  - 0 New Admissions / Biomedical engineer / Ordering NPWT, Apligraf, etc. '[]'$  - 0 Emergency Hospital Admission (emergent condition) PROCESS - Special Needs '[]'$  - Pediatric / Minor Patient Management 0 '[]'$  - 0 Isolation Patient Management '[]'$  - 0 Hearing / Language / Visual special needs '[]'$  - 0 Assessment of Community assistance (transportation, D/C planning, etc.) '[]'$  - 0 Additional assistance / Altered mentation '[]'$  - 0 Support Surface(s) Assessment (bed, cushion, seat, etc.) INTERVENTIONS - Miscellaneous '[]'$  - External ear exam 0 '[]'$  - 0 Patient Transfer (multiple staff / Civil Service fast streamer / Similar devices) '[]'$  - 0 Simple Staple / Suture removal (25 or less) '[]'$  - 0 Complex Staple / Suture removal (26 or more) '[]'$  - 0 Hypo/Hyperglycemic Management (do not check if billed separately) '[]'$  - 0 Ankle / Brachial Index (ABI) - do not check if billed separately Has the patient been seen at the hospital within the last three years: Yes Total Score: 0 Level Of Care: ____ Chad [name] ([id number]) Electronic Signature(s) Signed: 04/15/2021 3:50:58 PM By: Donnamarie Poag Entered By: Donnamarie Poag on 04/15/2021 15:27:11 Chad [name] ([id number]) -------------------------------------------------------------------------------- Encounter  Discharge Information Details Patient Name: Chad [name] Date of Service: 04/15/2021 2:30 PM Medical Record Number: [id number] Patient Account Number: [id number] Date of Birth/Sex: 1961/02/27 (60 y.o. M) Treating RN: Donnamarie Poag Primary Care Ernestene Coover: Humphrey Rolls,  Latricia Heft Other Clinician: Referring Margaree Sandhu: Clayborn Bigness Treating Lucciano Vitali/Extender: Skipper Cliche in Treatment: 34 Encounter Discharge Information Items Post Procedure Vitals Discharge Condition: Stable Temperature (F): 98.7 Ambulatory Status: Wheelchair Pulse (bpm): 87 Discharge Destination: [place] Respiratory Rate (breaths/min): 16 Telephoned: No Blood Pressure (mmHg): 115/79 Orders Sent: Yes Transportation: Other Accompanied By: self Schedule Follow-up Appointment: Yes Clinical Summary of Care: Electronic Signature(s) Signed: 04/15/2021 3:50:58 PM By: Donnamarie Poag Entered By: Donnamarie Poag on 04/15/2021 15:41:17 Chad [name] ([id number]) -------------------------------------------------------------------------------- Lower Extremity Assessment Details Patient Name: Chad [name] Date of Service: 04/15/2021 2:30 PM Medical Record Number: [id number] Patient Account Number: [id number] Date of Birth/Sex: 11-02-1960 (60 y.o. M) Treating RN: Cornell Barman Primary Care Shauntee Karp: Clayborn Bigness Other Clinician: Referring Dennison Mcdaid: Clayborn Bigness Treating Keli Buehner/Extender: Skipper Cliche in Treatment: 34 Vascular Assessment Pulses: Dorsalis Pedis Palpable: [Left:Yes] Electronic Signature(s) Signed: 04/15/2021 4:05:19 PM By: Gretta Cool, BSN, RN, CWS, Kim RN, BSN Entered By: Gretta Cool, BSN, RN, CWS, Kim on 04/15/2021 15:13:03 Chad [name] ([id number]) -------------------------------------------------------------------------------- Multi Wound Chart Details Patient Name: Chad [name] Date of Service: 04/15/2021 2:30 PM Medical Record Number: [id number] Patient Account Number: [id number] Date of Birth/Sex:  Mar 11, 1961 (60 y.o. M) Treating RN: Donnamarie Poag Primary Care Marolyn Urschel: Clayborn Bigness Other Clinician: Referring Gustava Berland: Clayborn Bigness Treating Cassondra Stachowski/Extender: Skipper Cliche in Treatment: 34 Vital Signs Height(in): 67 Pulse(bpm): 79 Weight(lbs): 160 Blood Pressure(mmHg): 115/79 Body Mass Index(BMI): 25 Temperature(F): 98.7 Respiratory Rate(breaths/min): 16 Photos: [N/A:N/A] Wound Location: Left, Medial Ankle N/A N/A Wounding Event: Gradually Appeared N/A N/A Primary Etiology: Venous Leg Ulcer N/A N/A Comorbid History: Anemia, Chronic Obstructive N/A N/A Pulmonary Disease (COPD), Hepatitis C, Received Chemotherapy Date Acquired: 07/12/2019 N/A N/A Weeks of Treatment: 34 N/A N/A Wound Status: Open N/A N/A Measurements L x W x D (cm) 2x1.1x0.2 N/A N/A Area (cm) : 1.728 N/A N/A Volume (cm) : 0.346 N/A N/A % Reduction in Area: 70.70% N/A N/A % Reduction in Volume: 41.30% N/A N/A Classification: Full Thickness Without Exposed N/A N/A Support Structures Exudate Amount: Medium N/A N/A Exudate Type: Serosanguineous N/A N/A Exudate Color: red, brown N/A N/A Wound Margin: Flat and Intact N/A N/A Chad [name], Chad [name] ([id number]) Granulation Amount: Medium (34-66%) N/A N/A Granulation Quality: Pink N/A N/A Necrotic Amount: Medium (34-66%) N/A N/A Exposed Structures: Fat Layer (Subcutaneous Tissue): N/A N/A Yes Fascia: No Tendon: No Muscle: No Joint: No Bone: No Epithelialization: None N/A N/A Treatment Notes Electronic Signature(s) Signed: 04/15/2021 3:50:58 PM By: Donnamarie Poag Entered By: Donnamarie Poag on 04/15/2021 15:22:21 Chad [name] ([id number]) -------------------------------------------------------------------------------- Multi-Disciplinary Care Plan Details Patient Name: Chad [name] Date of Service: 04/15/2021 2:30 PM Medical Record Number: [id number] Patient Account Number: [id number] Date of Birth/Sex: [birth year] (60 y.o. M) Treating RN: Donnamarie Poag Primary Care Skyla Champagne: Clayborn Bigness Other Clinician: Referring Sherissa Tenenbaum: Clayborn Bigness Treating Terren Haberle/Extender: Jeri Cos Weeks in Treatment: 34 Active Inactive Electronic Signature(s) Signed: 04/15/2021 3:50:58 PM By: Donnamarie Poag Entered By: Donnamarie Poag on 04/15/2021 15:22:11 Chad [name] ([id number]) -------------------------------------------------------------------------------- Pain Assessment Details Patient Name: Chad [name] Date of Service: 04/15/2021 2:30 PM Medical Record Number: [id number] Patient Account Number: [id number] Date of Birth/Sex: [birth year] (60 y.o. M) Treating RN: Cornell Barman Primary Care Kotaro Buer: Clayborn Bigness Other Clinician: Referring Equilla Que: Clayborn Bigness Treating Meygan Kyser/Extender: Skipper Cliche in Treatment: 34 Active Problems Location of Pain Severity and Description of Pain Patient Has Paino No Site Locations Pain Management and Medication Current Pain Management: Electronic Signature(s) Signed: 04/15/2021 4:05:19 PM By: Gretta Cool, BSN, RN, CWS, Kim RN, BSN Entered By: Gretta Cool, BSN, RN, CWS, Kim  on 04/15/2021 14:59:06 Chad [name], Chad [name] ([id number]) -------------------------------------------------------------------------------- Patient/Caregiver Education Details Patient Name: Chad [name] Date of Service: 04/15/2021 2:30 PM Medical Record Number: [id number] Patient Account Number: [id number] Date of Birth/Gender: Nov 16, 1960 (61 y.o. M) Treating RN: Donnamarie Poag Primary Care Physician: Clayborn Bigness Other Clinician: Referring Physician: Clayborn Bigness Treating Physician/Extender: Skipper Cliche in Treatment: 29 Education Assessment Education Provided To: Patient Education Topics Provided Basic Hygiene: Wound/Skin Impairment: Electronic Signature(s) Signed: 04/15/2021 3:50:58 PM By: Donnamarie Poag Entered By: Donnamarie Poag on 04/15/2021 15:22:40 Chad [name]  ([id number]) -------------------------------------------------------------------------------- Wound Assessment Details Patient Name: Chad [name] Date of Service: 04/15/2021 2:30 PM Medical Record Number: [id number] Patient Account Number: [id number] Date of Birth/Sex: 10-Jun-1961 (60 y.o. M) Treating RN: Cornell Barman Primary Care Remie Mathison: Clayborn Bigness Other Clinician: Referring Briena Swingler: Clayborn Bigness Treating Ambre Kobayashi/Extender: Jeri Cos Weeks in Treatment: 34 Wound Status Wound Number: 8 Primary Venous Leg Ulcer Etiology: Wound Location: Left, Medial Ankle Wound Open Wounding Event: Gradually Appeared Status: Date Acquired: 07/12/2019 Comorbid Anemia, Chronic Obstructive Pulmonary Disease (COPD), Weeks Of Treatment: 34 History: Hepatitis C, Received Chemotherapy Clustered Wound: No Photos Wound Measurements Length: (cm) 2 Width: (cm) 1.1 Depth: (cm) 0.2 Area: (cm) 1.728 Volume: (cm) 0.346 % Reduction in Area: 70.7% % Reduction in Volume: 41.3% Epithelialization: None Tunneling: No Undermining: No Wound Description Classification: Full Thickness Without Exposed Support Structu Wound Margin: Flat and Intact Exudate Amount: Medium Exudate Type: Serosanguineous Exudate Color: red, brown res Foul Odor After Cleansing: No Slough/Fibrino Yes Wound Bed Granulation Amount: Medium (34-66%) Exposed Structure Granulation Quality: Pink Fascia Exposed: No Necrotic Amount: Medium (34-66%) Fat Layer (Subcutaneous Tissue) Exposed: Yes Necrotic Quality: Adherent Slough Tendon Exposed: No Muscle Exposed: No Chad [name], Chad [name] ([id number]) Joint Exposed: No Bone Exposed: No Treatment Notes Wound #8 (Ankle) Wound Laterality: Left, Medial Cleanser Normal Saline Discharge Instruction: Wash your hands with soap and water. Remove old dressing, discard into plastic bag and place into trash. Cleanse the wound with Normal Saline prior to applying a clean dressing using gauze sponges,  not tissues or cotton balls. Do not scrub or use excessive force. Pat dry using gauze sponges, not tissue or cotton balls. Peri-Wound Care Desitin Maximum Strength Ointment 4 (oz) Discharge Instruction: Apply zinc periwound Topical Primary Dressing Hydrofera Blue Ready Transfer Foam, 2.5x2.5 (in/in) Discharge Instruction: Apply Hydrofera Blue Ready to wound bed as directed (EXTRA SENT WITH PATIENT, PLEASE USE) Secondary Dressing ABD Pad 5x9 (in/in) Discharge Instruction: Cover with ABD pad Kerlix 4.5 x 4.1 (in/yd) Discharge Instruction: Apply kerlix over ABD pad Secured With 74M [place] [facility] Tape, 2x2 (in/yd) Discharge Instruction: Secure kerlix Compression Wrap Compression Stockings Add-Ons Electronic Signature(s) Signed: 04/15/2021 3:50:58 PM By: Donnamarie Poag Signed: 04/15/2021 4:05:19 PM By: Gretta Cool, BSN, RN, CWS, Kim RN, BSN Entered By: Donnamarie Poag on 04/15/2021 15:32:05 Chad [name] ([id number]) -------------------------------------------------------------------------------- [place] Details Patient Name: Chad [name] Date of Service: 04/15/2021 2:30 PM Medical Record Number: [id number] Patient Account Number: [id number] Date of Birth/Sex: [birth year] (60 y.o. M) Treating RN: Cornell Barman Primary Care Shyann Hefner: Clayborn Bigness Other Clinician: Referring Bricelyn Freestone: Clayborn Bigness Treating Johann Gascoigne/Extender: Skipper Cliche in Treatment: 34 Vital Signs Time Taken: 14:58 Temperature (F): 98.7 Height (in): 67 Pulse (bpm): 87 Weight (lbs): 160 Respiratory Rate (breaths/min): 16 Body Mass Index (BMI): 25.1 Blood Pressure (mmHg): 115/79 Reference Range: 80 - 120 mg / dl Electronic Signature(s) Signed: 04/15/2021 4:05:19 PM By: Gretta Cool, BSN, RN, CWS, Kim RN, BSN Entered By: Gretta Cool, BSN, RN, CWS, Kim on 04/15/2021 14:58:46

## 2021-04-15 NOTE — Progress Notes (Addendum)
HALE, HARTIGAN ([id number]) Visit Report for 04/15/2021 Chief Complaint Document Details Patient Name: Chad [name], Chad [name] Date of Service: 04/15/2021 2:30 PM Medical Record Number: [id number] Patient Account Number: [id number] Date of Birth/Sex: 1961-04-20 (60 y.o. M) Treating RN: Donnamarie Poag Primary Care Provider: Clayborn Bigness Other Clinician: Referring Provider: Clayborn Bigness Treating Provider/Extender: Skipper Cliche in Treatment: 34 Information Obtained from: Patient Chief Complaint Left ankle and right foot and heel ulcers Electronic Signature(s) Signed: 04/15/2021 2:47:01 PM By: Worthy Keeler PA-C Entered By: Worthy Keeler on 04/15/2021 14:47:01 Chad [name] ([id number]) -------------------------------------------------------------------------------- Debridement Details Patient Name: Chad [name] Date of Service: 04/15/2021 2:30 PM Medical Record Number: [id number] Patient Account Number: [id number] Date of Birth/Sex: Sep 06, 1960 (60 y.o. M) Treating RN: Donnamarie Poag Primary Care Provider: Clayborn Bigness Other Clinician: Referring Provider: Clayborn Bigness Treating Provider/Extender: Skipper Cliche in Treatment: 34 Debridement Performed for Wound #8 Left,Medial Ankle Assessment: Performed By: Physician Tommie Sams., PA-C Debridement Type: Debridement Severity of Tissue Pre Debridement: Fat layer exposed Level of Consciousness (Pre- Awake and Alert procedure): Pre-procedure Verification/Time Out Yes - 15:24 Taken: Start Time: 15:24 Pain Control: Lidocaine Total Area Debrided (L x W): 2 (cm) x 1.1 (cm) = 2.2 (cm) Tissue and other material Viable, Non-Viable, Slough, Subcutaneous, Slough debrided: Level: Skin/Subcutaneous Tissue Debridement Description: Excisional Instrument: Curette Bleeding: Minimum Hemostasis Achieved: Pressure Response to Treatment: Procedure was tolerated well Level of Consciousness (Post- Awake and Alert procedure): Post Debridement  Measurements of Total Wound Length: (cm) 2 Width: (cm) 1.1 Depth: (cm) 0.2 Volume: (cm) 0.346 Character of Wound/Ulcer Post Debridement: Improved Severity of Tissue Post Debridement: Fat layer exposed Post Procedure Diagnosis Same as Pre-procedure Electronic Signature(s) Signed: 04/15/2021 3:50:58 PM By: Donnamarie Poag Signed: 04/15/2021 6:26:07 PM By: Worthy Keeler PA-C Entered By: Donnamarie Poag on 04/15/2021 15:27:01 Chad [name] ([id number]) -------------------------------------------------------------------------------- HPI Details Patient Name: Chad [name] Date of Service: 04/15/2021 2:30 PM Medical Record Number: [id number] Patient Account Number: [id number] Date of Birth/Sex: 02-Feb-1961 (60 y.o. M) Treating RN: Donnamarie Poag Primary Care Provider: Clayborn Bigness Other Clinician: Referring Provider: Clayborn Bigness Treating Provider/Extender: Skipper Cliche in Treatment: 34 History of Present Illness HPI Description: 10/08/18 on evaluation today patient actually presents to our office for initial evaluation concerning wounds that he has of the bilateral lower extremities. 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 He also has bilateral venous stasis. He apparently has been homeless currently following discharge from the hospital apparently he has been placed at [facility] which is is a skilled nursing facility locally. Nonetheless fortunately he does not show any signs of infection at this time which is good news. In fact several of the wound actually appears to be showing some signs of improvement already in my pinion. There are a couple areas in the left leg in particular there likely gonna require some sharp debridement to help clear away some necrotic tissue and help with more sufficient healing. No fevers, chills,  nausea, or vomiting noted at this time. 10/15/18 on evaluation today patient actually appears to be doing very well in regard to his bilateral lower extremities. He's been tolerating the dressing changes without complication. Fortunately there does not appear to be any evidence of active infection at this time which is great news. Overall I'm actually very pleased with how this has progressed in just one visits time. Readmission: 08/14/2020 upon evaluation  today patient presents for re-evaluation here in our clinic. He is having issues with his left ankle region as well as his right toe and his right heel. He tells me that the toe and heel actually began as a area that was itching that he was scratching and then subsequently opened up into wounds. These may have been abscess areas I presume based on what I am seeing currently. With regard to his left ankle region he tells me this was a similar type occurrence although he does have venous stasis this very well may be more of a venous leg ulcer more than anything. Nonetheless I do believe that the patient would benefit from appropriate and aggressive wound care to try to help get things under better control here. He does have history of a stroke on the left side affecting him to some degree there that he is able to stand although he does have some residual weakness. Otherwise again the patient does have chronic venous insufficiency as previously noted. His arterial studies most recently obtained showed that he had an ABI on the right of 1.16 with a TBI of 0.52 and on the left and ABI of 1.14 with a TBI of 0.81. That was obtained on 06/19/2020. 08/28/2020 upon evaluation today patient appears to be doing decently well in regard to his wounds in general. He has been tolerating the dressing changes without complication. Fortunately there does not appear to be any signs of active infection which is great news. With that being said I think the [facility]  is doing a good job I would recommend that we likely continue with that currently. 09/11/2020 upon evaluation today patient's wounds did not appear to be doing too poorly but again he is not really showing signs of significant improvement with regard to any of the wounds on the right. None of them have Hydrofera Blue on them I am not exactly sure why this is not being followed as the facility did not contact us to let us know of any issues with obtaining dressings or otherwise. With that being said he is supposed to be using Hydrofera Blue on both of the wounds on the right foot as well as the ankle wound on the left side. 09/18/2020 upon evaluation today patient appears to be doing poorly with regard to his wounds. Again right now the left ankle in particular showed signs of extreme maceration. Apparently he was told by someone with staff at [facility] they could not get the [facility]. With that being said this is something that is never been relayed to [place] one way or another. Also the patient subsequently has not supposed to have a border gauze dressing on. He should have an ABD pad and roll gauze to secure as this drains much too much just to have a border gauze dressing to cover. Nonetheless the fact that they are not using the appropriate dressing is directly causing deterioration of the left ankle wound it is significantly worse today compared to what it was previous. I did attempt to call [facility] while the patient was here I called three times and got no one to even pick up the phone. After this I had my for an office coordinator call and she was able to finally get through and leave a message with the D ON as of dictation of this note which is roughly about an hour and a half later I still have not been able to speak with anyone at the  facility. 09/25/2020 upon evaluation today patient actually showing signs of good improvement which is excellent news. He has been  tolerating the dressing changes without complication. Fortunately there is no signs of active infection which is great news. No fevers, chills, nausea, vomiting, or diarrhea. I do feel like the facility has been doing a much better job at taking care of him as far as the dressings are concerned. However the director of nursing never did call me back. 10/09/2020 upon evaluation today patient appears to be doing well with regard to his wound. The toe ulcer did require some debridement but the other 2 areas actually appear to be doing quite well. 10/19/2020 upon evaluation today patient actually appears to be doing very well in regard to his wounds. In fact the heel does appear to be completely healed. The toe is doing better in the medial ankle on the left is also doing better. Overall I think he is headed in the right direction. 10/26/2020 upon evaluation today patient appears to be doing well with regard to his wound. He is showing signs of improvement which is great news and overall I am very pleased with where things stand today. No fevers, chills, nausea, vomiting, or diarrhea. 11/02/2020 upon evaluation today patient appears to be doing well with regard to his wounds. He has been tolerating the dressing changes without complication overall I am extremely pleased with where things stand today. He in regard to the toe is almost completely healed and the medial ankle on the left is doing much better. 11/09/2020 upon evaluation today patient appears to be doing a little poorly in regard to his left medial ankle ulcer. Fortunately there does not appear to be any signs of systemic infection but unfortunately locally he does appear to be infected in fact he has blue-green drainage consistent with Pseudomonas. Chad [name], Chad [name] ([id number]) 11/16/2020 upon evaluation today patient appears to be doing well with regard to his wound. It actually appears to be doing better. I did place him on gentamicin cream since  the Cipro was actually resistant even though he was positive for Pseudomonas on culture. Overall I think that he does seem to be doing better though I am unsure whether or not they have actually been putting the cream on. The patient is not sure that we did talk to the nurse directly and she was going to initiate that treatment. Fortunately there does not appear to be any signs of active infection at this time. No fevers, chills, nausea, vomiting, or diarrhea. 4/28; the area on the right second toe is close to healed. Left medial ankle required debridement 12/07/2020 upon evaluation today patient appears to be doing well with regard to his wounds. In fact the right second toe appears to be completely healed which is great news. Fortunately there does not appear to be any signs of active infection at this time which is also great news. I think we can probably discontinue the gentamicin on top of everything else. 12/14/2020 upon evaluation today patient appears to be doing well with regard to his wound. He is making good progress and overall very pleased with where things stand today. There is no signs of active infection at this time which is great news. 12/28/2020 upon evaluation today patient appears to be doing well with regard to his wounds. He has been tolerating the dressing changes without complication. Fortunately there is no signs of active infection at this time. No fevers, chills, nausea, vomiting, or diarrhea. 12/28/2020 upon evaluation  today patient's wound bed actually showed signs of excellent improvement. He has great epithelization and granulation I do not see any signs of infection overall I am extremely pleased with where things stand at this point. No fevers, chills, nausea, vomiting, or diarrhea. 01/11/2021 upon evaluation today patient appears to be doing well with regard to his wound on his leg. He has been tolerating the dressing changes without complication. Fortunately there does  not appear to be any signs of active infection which is great news. No fevers, chills, nausea, vomiting, or diarrhea. 01/25/2021 upon evaluation today patient appears to be doing well with regard to his wound. He has been tolerating the dressing changes without complication. Fortunately the collagen seems to be doing a great job which is excellent news. No fevers, chills, nausea, vomiting, or diarrhea. 02/08/2021 upon evaluation today patient's wound is actually looking a little bit worse especially in the periwound compared to previous. Fortunately there does not appear to be any signs of infection which is great news with that being said he does have some irritation around the periphery of the wound which has me more concerned. He actually had a dressing on that had not been changed in 3 days. He also is supposed to have daily dressing changes. With regard to the dressing applied he had a silver alginate dressing and silver collagen is what is recommended and ordered. He also had no Desitin around the edges of the wound in the periwound region although that is on the order inspect to be done as well. In general I was very concerned I did contact [facility] actually spoke with Magda Paganini who is the scheduling individual and subsequently she stated that she would pass the information to the D ON apparently the D ON was not available to talk to me when I call today. 02/18/2021 upon evaluation today patient's wound is actually showing signs of improvement. Fortunately there does not appear to be any evidence of infection which is great news overall I am extremely pleased with where things stand today. No fevers, chills, nausea, vomiting, or diarrhea. 8/3; patient presents for 1 week follow-up. He has no issues or complaints today. He denies signs of infection. 03/11/2021 upon evaluation today patient appears to be doing well with regard to his wound. He does have a little bit of slough noted on  the surface of the wound but fortunately there does not appear to be any signs of active infection at this time. No fevers, chills, nausea, vomiting, or diarrhea. 03/18/2021 upon evaluation today patient appears to be doing well with regard to his wound. He has been tolerating the dressing changes without complication. There was a little irritation more proximal to where the wound was that was not noted last week but nonetheless this is very superficial just seems to be more irritation we just need to make sure to put a good amount of the zinc over the area in my opinion. Otherwise he does not seem to be doing significantly worse at all which is great news. 03/25/2021 upon evaluation today patient appears to be doing well with regard to his wound. He is going require some sharp debridement today to clear with some of the necrotic debris. I did perform this today without complication postdebridement wound bed appears to be doing much better this is great news. 04/08/2021 upon evaluation today patient appears to be doing decently well in regard to his wound although the overall measurement is not significantly smaller compared to previous. It  is gone down a little bit but still the facility continues to not really put the appropriate dressings in place in fact he was supposed to have collagen we think he probably had more of an allergy to At this point. Fortunately there does not appear to be any signs of active infection systemically though locally I do not see anything on initial visualization either as far as erythema or warmth. 04/15/2021 upon evaluation today patient appears to be doing well with regard to his wound. He is actually showing signs of improvement. I did place him on antibiotics last week, Cipro. He has been taking that 2 times a day and seems to be tolerating it very well. I do not see any evidence of worsening and in fact the overall appearance of the wound is smaller today which is  also great news. Electronic Signature(s) Signed: 04/15/2021 3:33:15 PM By: Worthy Keeler PA-C Entered By: Worthy Keeler on 04/15/2021 15:33:15 Chad [name] (Chad [name]) -------------------------------------------------------------------------------- Physical Exam Details Patient Name: Chad [name] Date of Service: 04/15/2021 2:30 PM Medical Record Number: Chad [name] Patient Account Number: [id number] Date of Birth/Sex: 12-22-1960 (60 y.o. M) Treating RN: Donnamarie Poag Primary Care Provider: Clayborn Bigness Other Clinician: Referring Provider: Clayborn Bigness Treating Provider/Extender: Skipper Cliche in Treatment: 68 Constitutional Well-nourished and well-hydrated in no acute distress. Respiratory normal breathing without difficulty. Psychiatric this patient is able to make decisions and demonstrates good insight into disease process. Alert and Oriented x 3. pleasant and cooperative. Notes Upon inspection patient's wound bed showed signs of good granulation at this point. There does not appear to be any signs of active infection which is great news. There was some slough and biofilm noted on the surface of the wound and we did do fluorescence imaging to follow-up from last week to see where we were on the bioburden level. The before picture actually did appear to be significantly improved compared to last week. I did not see any of the cyan fluorescence mainly I saw blush Fluorescence This was around the perimeter of the wound. With that being said postdebridement there was more of the red fluorescence noted. Indicative of bacterial infection. Electronic Signature(s) Signed: 04/15/2021 3:35:09 PM By: Worthy Keeler PA-C Entered By: Worthy Keeler on 04/15/2021 15:35:09 Chad [name] (Chad [name]) -------------------------------------------------------------------------------- Physician Orders Details Patient Name: Chad [name] Date of Service: 04/15/2021 2:30 PM Medical Record  Number: Chad [name] Patient Account Number: [id number] Date of Birth/Sex: February 23, 1961 (60 y.o. 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 Bathing/ Shower/ Hygiene o May shower; gently cleanse wound with antibacterial soap, rinse and pat dry prior to dressing wounds Edema Control - Lymphedema / Segmental Compressive Device / Other o Elevate legs to the level of the heart and pump ankles as often as possible o Elevate leg(s) parallel to the floor when sitting. o DO YOUR BEST to sleep in the bed at night. DO NOT sleep in your recliner. Long hours of sitting in a recliner leads to swelling  of the legs and/or potential wounds on your backside. Medications-Please add to medication list. o P.O. Antibiotics - Continue as ordered Wound Treatment Wound #8 - Ankle Wound Laterality: Left, Medial Cleanser: Normal Saline 3 x Per Week/30 Days Discharge Instructions: Wash your hands with soap and water. Remove old dressing, discard into plastic bag and place into trash. Cleanse the wound with Normal Saline prior to applying a clean dressing using gauze sponges, not tissues or cotton balls. Do not scrub or use excessive force. Pat dry using gauze sponges, not tissue or cotton balls. Peri-Wound Care: Desitin Maximum Strength Ointment 4 (oz) 3 x Per Week/30  Days Discharge Instructions: Apply zinc periwound Primary Dressing: Hydrofera Blue Ready Transfer Foam, 2.5x2.5 (in/in) 3 x Per Week/30 Days Discharge Instructions: Apply Hydrofera Blue Ready to wound bed as directed (EXTRA SENT WITH PATIENT, PLEASE USE) Secondary Dressing: ABD Pad 5x9 (in/in) 3 x Per Week/30 Days Discharge Instructions: Cover with ABD pad Secondary Dressing: Kerlix 4.5 x 4.1 (in/yd) 3 x Per Week/30 Days Discharge Instructions: Apply kerlix over ABD pad Secured With: 68M Medipore H Soft Cloth Surgical Tape, 2x2 (in/yd) 3 x Per Week/30 Days Discharge Instructions: Secure kerlix Electronic Signature(s) Signed: 04/15/2021 3:50:58 PM By: Donnamarie Poag Signed: 04/15/2021 6:26:07 PM By: Worthy Keeler PA-C Entered By: Donnamarie Poag on 04/15/2021 15:25:25 Chad [name] (Chad [name]) -------------------------------------------------------------------------------- Problem List Details Patient Name: Chad [name] Date of Service: 04/15/2021 2:30 PM Medical Record Number: Chad [name] Patient Account Number: [id number] Date of Birth/Sex: 1960/12/24 (60 y.o. M) Treating RN: Donnamarie Poag Primary Care Provider: Clayborn Bigness Other Clinician: Referring Provider: Clayborn Bigness Treating Provider/Extender: Skipper Cliche in Treatment: 34 Active Problems ICD-10 Encounter Code Description Active Date MDM Diagnosis I87.2 Venous insufficiency (chronic) (peripheral) 08/14/2020 No Yes L97.322 Non-pressure chronic ulcer of left ankle with fat layer exposed 08/14/2020 No Yes L97.412 Non-pressure chronic ulcer of right heel and midfoot with fat layer 08/14/2020 No Yes exposed L97.512 Non-pressure chronic ulcer of other part of right foot with fat layer 08/14/2020 No Yes exposed 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 M) Treating RN: Donnamarie Poag Primary Care Provider: Clayborn Bigness Other Clinician: Referring Provider: Clayborn Bigness Treating Provider/Extender: Skipper Cliche in Treatment: 34 Subjective Chief Complaint Information obtained from Patient Left ankle and right foot and heel ulcers History of Present Illness (HPI) 10/08/18 on evaluation today patient actually presents to our office for initial evaluation concerning wounds that he has of the bilateral lower extremities. 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 He also has bilateral venous stasis. He apparently has been homeless currently following discharge from the hospital apparently he has been placed at [facility] which is is a skilled nursing facility locally. Nonetheless fortunately he does not show any signs of infection at this time which is good news. In fact several of the wound actually appears to be showing some signs of improvement already in my pinion. There are a couple areas in the left leg in particular there likely gonna require some  sharp debridement to help clear away some necrotic tissue and help with more sufficient healing. No fevers, chills, nausea, or vomiting noted at this time. 10/15/18 on evaluation today patient actually appears to be doing very well in regard to his bilateral lower extremities. He's been tolerating the dressing changes without complication. Fortunately there does not appear to be any  evidence of active infection at this time which is great news. Overall I'm actually very pleased with how this has progressed in just one visits time. Readmission: 08/14/2020 upon evaluation today patient presents for re-evaluation here in our clinic. He is having issues with his left ankle region as well as his right toe and his right heel. He tells me that the toe and heel actually began as a area that was itching that he was scratching and then subsequently opened up into wounds. These may have been abscess areas I presume based on what I am seeing currently. With regard to his left ankle region he tells me this was a similar type occurrence although he does have venous stasis this very well may be more of a venous leg ulcer more than anything. Nonetheless I do believe that the patient would benefit from appropriate and aggressive wound care to try to help get things under better control here. He does have history of a stroke on the left side affecting him to some degree there that he is able to stand although he does have some residual weakness. Otherwise again the patient does have chronic venous insufficiency as previously noted. His arterial studies most recently obtained showed that he had an ABI on the right of 1.16 with a TBI of 0.52 and on the left and ABI of 1.14 with a TBI of 0.81. That was obtained on 06/19/2020. 08/28/2020 upon evaluation today patient appears to be doing decently well in regard to his wounds in general. He has been tolerating the dressing changes without complication. Fortunately there does not appear to be any signs of active infection which is great news. With that being said I think the [facility] is doing a good job I would recommend that we likely continue with that currently. 09/11/2020 upon evaluation today patient's wounds did not appear to be doing too poorly but again he is not really showing signs of significant improvement with regard to any of the wounds  on the right. None of them have Hydrofera Blue on them I am not exactly sure why this is not being followed as the facility did not contact us to let us know of any issues with obtaining dressings or otherwise. With that being said he is supposed to be using Hydrofera Blue on both of the wounds on the right foot as well as the ankle wound on the left side. 09/18/2020 upon evaluation today patient appears to be doing poorly with regard to his wounds. Again right now the left ankle in particular showed signs of extreme maceration. Apparently he was told by someone with staff at [facility] they could not get the [facility]. With that being said this is something that is never been relayed to [place] one way or another. Also the patient subsequently has not supposed to have a border gauze dressing on. He should have an ABD pad and roll gauze to secure as this drains much too much just to have a border gauze dressing to cover. Nonetheless the fact that they are not using the appropriate dressing is directly causing deterioration of the left ankle wound  it is significantly worse today compared to what it was previous. I did attempt to call [facility] while the patient was here I called three times and got no one to even pick up the phone. After this I had my for an office coordinator call and she was able to finally get through and leave a message with the D ON as of dictation of this note which is roughly about an hour and a half later I still have not been able to speak with anyone at the facility. 09/25/2020 upon evaluation today patient actually showing signs of good improvement which is excellent news. He has been tolerating the dressing changes without complication. Fortunately there is no signs of active infection which is great news. No fevers, chills, nausea, vomiting, or diarrhea. I do feel like the facility has been doing a much better job at taking care of him as far as the  dressings are concerned. However the director of nursing never did call me back. 10/09/2020 upon evaluation today patient appears to be doing well with regard to his wound. The toe ulcer did require some debridement but the other 2 areas actually appear to be doing quite well. 10/19/2020 upon evaluation today patient actually appears to be doing very well in regard to his wounds. In fact the heel does appear to be completely healed. The toe is doing better in the medial ankle on the left is also doing better. Overall I think he is headed in the right direction. 10/26/2020 upon evaluation today patient appears to be doing well with regard to his wound. He is showing signs of improvement which is great news and overall I am very pleased with where things stand today. No fevers, chills, nausea, vomiting, or diarrhea. 11/02/2020 upon evaluation today patient appears to be doing well with regard to his wounds. He has been tolerating the dressing changes without complication overall I am extremely pleased with where things stand today. He in regard to the toe is almost completely healed and the medial [employer], Chad [name] ([id number]) ankle on the left is doing much better. 11/09/2020 upon evaluation today patient appears to be doing a little poorly in regard to his left medial ankle ulcer. Fortunately there does not appear to be any signs of systemic infection but unfortunately locally he does appear to be infected in fact he has blue-green drainage consistent with Pseudomonas. 11/16/2020 upon evaluation today patient appears to be doing well with regard to his wound. It actually appears to be doing better. I did place him on gentamicin cream since the Cipro was actually resistant even though he was positive for Pseudomonas on culture. Overall I think that he does seem to be doing better though I am unsure whether or not they have actually been putting the cream on. The patient is not sure that we did talk to the  nurse directly and she was going to initiate that treatment. Fortunately there does not appear to be any signs of active infection at this time. No fevers, chills, nausea, vomiting, or diarrhea. 4/28; the area on the right second toe is close to healed. Left medial ankle required debridement 12/07/2020 upon evaluation today patient appears to be doing well with regard to his wounds. In fact the right second toe appears to be completely healed which is great news. Fortunately there does not appear to be any signs of active infection at this time which is also great news. I think we can probably discontinue the gentamicin on top  of everything else. 12/14/2020 upon evaluation today patient appears to be doing well with regard to his wound. He is making good progress and overall very pleased with where things stand today. There is no signs of active infection at this time which is great news. 12/28/2020 upon evaluation today patient appears to be doing well with regard to his wounds. He has been tolerating the dressing changes without complication. Fortunately there is no signs of active infection at this time. No fevers, chills, nausea, vomiting, or diarrhea. 12/28/2020 upon evaluation today patient's wound bed actually showed signs of excellent improvement. He has great epithelization and granulation I do not see any signs of infection overall I am extremely pleased with where things stand at this point. No fevers, chills, nausea, vomiting, or diarrhea. 01/11/2021 upon evaluation today patient appears to be doing well with regard to his wound on his leg. He has been tolerating the dressing changes without complication. Fortunately there does not appear to be any signs of active infection which is great news. No fevers, chills, nausea, vomiting, or diarrhea. 01/25/2021 upon evaluation today patient appears to be doing well with regard to his wound. He has been tolerating the dressing changes  without complication. Fortunately the collagen seems to be doing a great job which is excellent news. No fevers, chills, nausea, vomiting, or diarrhea. 02/08/2021 upon evaluation today patient's wound is actually looking a little bit worse especially in the periwound compared to previous. Fortunately there does not appear to be any signs of infection which is great news with that being said he does have some irritation around the periphery of the wound which has me more concerned. He actually had a dressing on that had not been changed in 3 days. He also is supposed to have daily dressing changes. With regard to the dressing applied he had a silver alginate dressing and silver collagen is what is recommended and ordered. He also had no Desitin around the edges of the wound in the periwound region although that is on the order inspect to be done as well. In general I was very concerned I did contact [facility] actually spoke with Magda Paganini who is the scheduling individual and subsequently she stated that she would pass the information to the D ON apparently the D ON was not available to talk to me when I call today. 02/18/2021 upon evaluation today patient's wound is actually showing signs of improvement. Fortunately there does not appear to be any evidence of infection which is great news overall I am extremely pleased with where things stand today. No fevers, chills, nausea, vomiting, or diarrhea. 8/3; patient presents for 1 week follow-up. He has no issues or complaints today. He denies signs of infection. 03/11/2021 upon evaluation today patient appears to be doing well with regard to his wound. He does have a little bit of slough noted on the surface of the wound but fortunately there does not appear to be any signs of active infection at this time. No fevers, chills, nausea, vomiting, or diarrhea. 03/18/2021 upon evaluation today patient appears to be doing well with regard to his wound. He has  been tolerating the dressing changes without complication. There was a little irritation more proximal to where the wound was that was not noted last week but nonetheless this is very superficial just seems to be more irritation we just need to make sure to put a good amount of the zinc over the area in my opinion. Otherwise he does not  seem to be doing significantly worse at all which is great news. 03/25/2021 upon evaluation today patient appears to be doing well with regard to his wound. He is going require some sharp debridement today to clear with some of the necrotic debris. I did perform this today without complication postdebridement wound bed appears to be doing much better this is great news. 04/08/2021 upon evaluation today patient appears to be doing decently well in regard to his wound although the overall measurement is not significantly smaller compared to previous. It is gone down a little bit but still the facility continues to not really put the appropriate dressings in place in fact he was supposed to have collagen we think he probably had more of an allergy to At this point. Fortunately there does not appear to be any signs of active infection systemically though locally I do not see anything on initial visualization either as far as erythema or warmth. 04/15/2021 upon evaluation today patient appears to be doing well with regard to his wound. He is actually showing signs of improvement. I did place him on antibiotics last week, Cipro. He has been taking that 2 times a day and seems to be tolerating it very well. I do not see any evidence of worsening and in fact the overall appearance of the wound is smaller today which is also great news. Objective MANOLITO, Chad [name] (Chad [name]) Constitutional Well-nourished and well-hydrated in no acute distress. Vitals Time Taken: 2:58 PM, Height: 67 in, Weight: 160 lbs, BMI: 25.1, Temperature: 98.7 F, Pulse: 87 bpm, Respiratory Rate: 16  breaths/min, Blood Pressure: 115/79 mmHg. Respiratory normal breathing without difficulty. Psychiatric this patient is able to make decisions and demonstrates good insight into disease process. Alert and Oriented x 3. pleasant and cooperative. General Notes: Upon inspection patient's wound bed showed signs of good granulation at this point. There does not appear to be any signs of active infection which is great news. There was some slough and biofilm noted on the surface of the wound and we did do fluorescence imaging to follow-up from last week to see where we were on the bioburden level. The before picture actually did appear to be significantly improved compared to last week. I did not see any of the cyan fluorescence mainly I saw blush Fluorescence This was around the perimeter of the wound. With that being said postdebridement there was more of the red fluorescence noted. Indicative of bacterial infection. Integumentary (Hair, Skin) Wound #8 status is Open. Original cause of wound was Gradually Appeared. The date acquired was: 07/12/2019. The wound has been in treatment 34 weeks. The wound is located on the Left,Medial Ankle. The wound measures 2cm length x 1.1cm width x 0.2cm depth; 1.728cm^2 area and 0.346cm^3 volume. There is Fat Layer (Subcutaneous Tissue) exposed. There is no tunneling or undermining noted. There is a medium amount of serosanguineous drainage noted. The wound margin is flat and intact. There is medium (34-66%) pink granulation within the wound bed. There is a medium (34-66%) amount of necrotic tissue within the wound bed including Adherent Slough. 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 There was a Excisional Skin/Subcutaneous Tissue Debridement with a total area of 2.2 sq cm performed by Tommie Sams., PA-C. With the following instrument(s): Curette to remove Viable and Non-Viable tissue/material. Material removed includes Subcutaneous Tissue and Slough and after achieving pain control using Lidocaine. A time out was conducted at 15:24, prior to the start of the procedure. A Minimum amount of bleeding was controlled with Pressure. The procedure was tolerated well. Post Debridement Measurements: 2cm length x 1.1cm width x 0.2cm depth; 0.346cm^3 volume. Character of Wound/Ulcer Post Debridement is improved. Severity of Tissue Post Debridement is: Fat layer exposed. Post procedure Diagnosis Wound #8: Same as Pre-Procedure Plan Follow-up Appointments: Return Appointment in 1 week. Bathing/ Shower/ Hygiene: May shower; gently cleanse wound with antibacterial soap, rinse and pat dry prior to dressing wounds Edema Control - Lymphedema / Segmental Compressive Device / Other: Elevate legs to the level of the heart and pump ankles as often as possible Elevate leg(s) parallel to the floor when sitting. DO YOUR BEST to sleep in the bed at night. DO NOT sleep in your recliner. Long hours of sitting in a recliner leads to swelling of the legs and/or potential wounds on your backside. Chad [name], Chad [name] ([id number]) Medications-Please add to medication list.: P.O. Antibiotics - Continue as ordered WOUND #8: - Ankle Wound Laterality: Left, Medial Cleanser: Normal Saline 3 x Per Week/30 Days Discharge Instructions: Wash your hands with soap and water. Remove old dressing, discard into plastic bag and place into trash. Cleanse the wound with Normal Saline prior  to applying a clean dressing using gauze sponges, not tissues or cotton balls. Do not scrub or use excessive force. Pat dry using gauze sponges, not tissue or cotton balls. Peri-Wound Care: Desitin Maximum Strength Ointment 4 (oz) 3 x Per Week/30 Days Discharge Instructions: Apply zinc periwound Primary Dressing: Hydrofera Blue Ready Transfer Foam, 2.5x2.5 (in/in) 3 x Per Week/30 Days Discharge Instructions: Apply Hydrofera Blue Ready to wound bed as directed (EXTRA SENT WITH PATIENT, PLEASE USE) Secondary Dressing: ABD Pad 5x9 (in/in) 3 x Per Week/30 Days Discharge Instructions: Cover with ABD pad Secondary Dressing: Kerlix 4.5 x 4.1 (in/yd) 3 x Per Week/30 Days Discharge Instructions: Apply kerlix over ABD pad Secured With: 28M Medipore H Soft Cloth Surgical Tape, 2x2 (in/yd) 3 x Per Week/30 Days Discharge Instructions: Secure kerlix 1. Based on what I am seeing currently I am going to recommend that we actually go ahead and initiate treatment with a continuation of the wound care measures as before utilizing the [facility] as well as the Cipro which does seem to be doing a great job. 2. I am also going to recommend that the patient be cleaned well whenever the wound care occurs at the facility. This should include antibacterial soap and water or a good wound cleanser. 3. I am also can recommend that he continue to monitor for any signs of worsening or infection if anything changes definitely should let me know but right now I think he is actually had an appropriate directions. We will see patient back for reevaluation in 1 week here in the clinic. If anything worsens or changes patient will contact our office for additional recommendations. MolecuLight DX: 1st Scanned Wound The following wound was scanned with MolecuLight DX): Left medial ankle ulcer Fluorescence bacterial imaging was medically necessary today due to Prior visit fluorescence image demonstrated a high bioburden  load (Indication): monitoring impact of prescribe tx MolecuLight Results Red Colors,  Blush Colors, [place] The indicated colors were noted in the following area(s). In the periphery of the wound As a result of todayos scan, the following treatment plans were put in Continue Hydrofera Blue and Cipro. Targeted debridement performed place. MolecuLight Procedure The MolecularLight DX device was cleaned with a disinfectant wipe prior to use., The correct patient profile was confirmed and correct wound was verified., Range finder sensor used to ensure appropriate distance The following was completed: selected between imaging unit and wound bed, Room lights were turned off and the ambient light sensor was checked., Blue circle appeared around the lightbulb., The fluorescence icon was selected. Screen was tapped to enhance focus and the image was captured. Additional drapes were used to ensure adequate darknesso No Potential ICD-10 Codes ICD-10 A49.9 Bacterial infection unspecified (Red/Blush/Yellow Color) Additional Scanned Wounds Did you scan any additional Woundso No Electronic Signature(s) Signed: 04/15/2021 3:39:47 PM By: Worthy Keeler PA-C Previous Signature: 04/15/2021 3:39:27 PM Version By: Worthy Keeler PA-C Entered By: Worthy Keeler on 04/15/2021 15:39:47 Chad [name] ([id number]) -------------------------------------------------------------------------------- SuperBill Details Patient Name: Chad [name] Date of Service: 04/15/2021 Medical Record Number: [id number] Patient Account Number: [id number] Date of Birth/Sex: [name], 1960 (60 y.o. M) Treating RN: Donnamarie Poag Primary Care Provider: Clayborn Bigness Other Clinician: Referring Provider: Clayborn Bigness Treating Provider/Extender: Skipper Cliche in Treatment: 34 Diagnosis Coding ICD-10 Codes Code Description I87.2 Venous insufficiency (chronic) (peripheral) L97.322 Non-pressure chronic ulcer of left ankle with fat  layer exposed L97.412 Non-pressure chronic ulcer of right heel and midfoot with fat layer exposed L97.512 Non-pressure chronic ulcer of other part of right foot with fat layer exposed 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 side Facility Procedures CPT4 Code: [id number] Description: [id number] - DEB SUBQ TISSUE 20 SQ CM/< Modifier: Quantity: 1 CPT4 Code: Description: ICD-10 Diagnosis Description [id number] Non-pressure chronic ulcer of left ankle with fat layer exposed Modifier: Quantity: CPT4 Code: [id number] Description: NONCNTACT RT FLORO WND 1ST STE (CDM [id number]) Modifier: Quantity: 1 CPT4 Code: Description: ICD-10 Diagnosis Description L97.322 Non-pressure chronic ulcer of left ankle with fat layer exposed Modifier: Quantity: Physician Procedures CPT4 [id number] Description: 11042 - WC PHYS SUBQ TISS 20 SQ CM Modifier: Quantity: 1 CPT4 Code: Description: ICD-10 Diagnosis Description L97.322 Non-pressure chronic ulcer of left ankle with fat layer exposed Modifier: Quantity: CPT4 Code: [id number] Description: HC MOLECULIGHT WND IMG 1ST ANATOMIC SITE Modifier: Quantity: 1 CPT4 Code: Description: ICD-10 Diagnosis Description L97.322 Non-pressure chronic ulcer of left ankle with fat layer exposed Modifier: Quantity: Electronic Signature(s) Signed: 04/15/2021 3:40:57 PM By: Worthy Keeler PA-C Previous Signature: 04/15/2021 3:40:23 PM Version By: Worthy Keeler PA-C Entered By: Worthy Keeler on 04/15/2021 15:40:57

## 2021-04-22 ENCOUNTER — Encounter: Payer: Medicaid Other | Admitting: Internal Medicine

## 2021-04-22 ENCOUNTER — Other Ambulatory Visit: Payer: Self-pay

## 2021-04-22 DIAGNOSIS — L97322 Non-pressure chronic ulcer of left ankle with fat layer exposed: Secondary | ICD-10-CM | POA: Diagnosis not present

## 2021-04-22 NOTE — Progress Notes (Signed)
Chad [name], Chad [name] ([id number]) Visit Report for 04/22/2021 Arrival Information Details Patient Name: Chad [name], Chad [name] Date of Service: 04/22/2021 9:00 AM Medical Record Number: [id number] Patient Account Number: [id number] Date of Birth/Sex: 06-08-1961 (60 y.o. M) Treating RN: Cornell Barman Primary Care Jennet Scroggin: Clayborn Bigness Other Clinician: Referring Aizley Stenseth: Clayborn Bigness Treating Adra Shepler/Extender: Tito Dine in Treatment: 64 Visit Information History Since Last Visit Added or deleted any medications: No Patient Arrived: Wheel Chair Has Dressing in Place as Prescribed: Yes Arrival Time: 08:41 Pain Present Now: No Accompanied By: self Transfer Assistance: None Patient Identification Verified: Yes Secondary Verification Process Completed: Yes Patient Requires Transmission-Based No Precautions: Patient Has Alerts: Yes Patient Alerts: Patient on Blood Thinner ABI right 1.16 ABI left 1.14 Electronic Signature(s) Signed: 04/22/2021 4:30:56 PM By: Gretta Cool, BSN, RN, CWS, Kim RN, BSN Entered By: Gretta Cool, BSN, RN, CWS, Kim on 04/22/2021 08:43:42 Chad [name] ([id number]) -------------------------------------------------------------------------------- Clinic Level of Care Assessment Details Patient Name: Chad [name] Date of Service: 04/22/2021 9:00 AM Medical Record Number: [id number] Patient Account Number: [id number] Date of Birth/Sex: 1961-06-20 (60 y.o. M) Treating RN: Cornell Barman Primary Care Kelsie Zaborowski: Clayborn Bigness Other Clinician: Referring Dary Dilauro: Clayborn Bigness Treating Braycen Burandt/Extender: Tito Dine in Treatment: 35 Clinic Level of Care Assessment Items TOOL 4 Quantity Score []  - Use when only an EandM is performed on FOLLOW-UP visit 0 ASSESSMENTS - Nursing Assessment / Reassessment X - Reassessment of Co-morbidities (includes updates in patient status) 1 10 X- 1 5 Reassessment of Adherence to Treatment Plan ASSESSMENTS - Wound and Skin Assessment /  Reassessment X - Simple Wound Assessment / Reassessment - one wound 1 5 []  - 0 Complex Wound Assessment / Reassessment - multiple wounds []  - 0 Dermatologic / Skin Assessment (not related to wound area) ASSESSMENTS - Focused Assessment []  - Circumferential Edema Measurements - multi extremities 0 []  - 0 Nutritional Assessment / Counseling / Intervention X- 1 5 Lower Extremity Assessment (monofilament, tuning fork, pulses) []  - 0 Peripheral Arterial Disease Assessment (using hand held doppler) ASSESSMENTS - Ostomy and/or Continence Assessment and Care []  - Incontinence Assessment and Management 0 []  - 0 Ostomy Care Assessment and Management (repouching, etc.) PROCESS - Coordination of Care X - Simple Patient / Family Education for ongoing care 1 15 []  - 0 Complex (extensive) Patient / Family Education for ongoing care X- 1 10 Staff obtains Consents, Records, Test Results / Process Orders []  - 0 Staff telephones HHA, Nursing Homes / Clarify orders / etc []  - 0 Routine Transfer to another Facility (non-emergent condition) []  - 0 Routine Hospital Admission (non-emergent condition) []  - 0 New Admissions / Biomedical engineer / Ordering NPWT, Apligraf, etc. []  - 0 Emergency Hospital Admission (emergent condition) X- 1 10 Simple Discharge Coordination []  - 0 Complex (extensive) Discharge Coordination PROCESS - Special Needs []  - Pediatric / Minor Patient Management 0 []  - 0 Isolation Patient Management []  - 0 Hearing / Language / Visual special needs []  - 0 Assessment of Community assistance (transportation, D/C planning, etc.) []  - 0 Additional assistance / Altered mentation []  - 0 Support Surface(s) Assessment (bed, cushion, seat, etc.) INTERVENTIONS - Wound Cleansing / Measurement Chad [name], Chad [name] ([id number]) X- 1 5 Simple Wound Cleansing - one wound []  - 0 Complex Wound Cleansing - multiple wounds X- 1 5 Wound Imaging (photographs - any number of wounds) []   - 0 Wound Tracing (instead of photographs) X- 1 5 Simple Wound Measurement - one wound []  - 0 Complex Wound Measurement - multiple wounds INTERVENTIONS -  Wound Dressings []  - Small Wound Dressing one or multiple wounds 0 X- 1 15 Medium Wound Dressing one or multiple wounds []  - 0 Large Wound Dressing one or multiple wounds []  - 0 Application of Medications - topical []  - 0 Application of Medications - injection INTERVENTIONS - Miscellaneous []  - External ear exam 0 []  - 0 Specimen Collection (cultures, biopsies, blood, body fluids, etc.) []  - 0 Specimen(s) / Culture(s) sent or taken to Lab for analysis []  - 0 Patient Transfer (multiple staff / Harrel Lemon Lift / Similar devices) []  - 0 Simple Staple / Suture removal (25 or less) []  - 0 Complex Staple / Suture removal (26 or more) []  - 0 Hypo / Hyperglycemic Management (close monitor of Blood Glucose) []  - 0 Ankle / Brachial Index (ABI) - do not check if billed separately X- 1 5 Vital Signs Has the patient been seen at the hospital within the last three years: Yes Total Score: 95 Level Of Care: New/Established - Level 3 Electronic Signature(s) Signed: 04/22/2021 4:30:56 PM By: Gretta Cool, BSN, RN, CWS, Kim RN, BSN Entered By: Gretta Cool, BSN, RN, CWS, Kim on 04/22/2021 09:12:30 Chad [name] ([id number]) -------------------------------------------------------------------------------- Encounter Discharge Information Details Patient Name: Chad [name] Date of Service: 04/22/2021 9:00 AM Medical Record Number: [id number] Patient Account Number: [id number] Date of Birth/Sex: 07-14-1961 (60 y.o. M) Treating RN: Cornell Barman Primary Care Larua Collier: Clayborn Bigness Other Clinician: Referring Jaisean Monteforte: Clayborn Bigness Treating Avonne Berkery/Extender: Tito Dine in Treatment: 60 Encounter Discharge Information Items Discharge Condition: Stable Ambulatory Status: Wheelchair Discharge Destination: Skilled Nursing Facility Telephoned:  No Orders Sent: Yes Transportation: Private Auto Accompanied By: self Schedule Follow-up Appointment: Yes Clinical Summary of Care: Electronic Signature(s) Signed: 04/22/2021 4:30:56 PM By: Gretta Cool, BSN, RN, CWS, Kim RN, BSN Entered By: Gretta Cool, BSN, RN, CWS, Kim on 04/22/2021 09:16:17 Chad [name] ([id number]) -------------------------------------------------------------------------------- Lower Extremity Assessment Details Patient Name: Chad [name] Date of Service: 04/22/2021 9:00 AM Medical Record Number: [id number] Patient Account Number: [id number] Date of Birth/Sex: Apr 27, 1961 (60 y.o. M) Treating RN: Cornell Barman Primary Care Oleta Gunnoe: Clayborn Bigness Other Clinician: Referring Damien Batty: Clayborn Bigness Treating Kammy Klett/Extender: Tito Dine in Treatment: 35 Edema Assessment Assessed: [Left: Yes] [Right: No] Edema: [Left: N] [Right: o] Vascular Assessment Pulses: Dorsalis Pedis Palpable: [Left:Yes] Electronic Signature(s) Signed: 04/22/2021 4:30:56 PM By: Gretta Cool, BSN, RN, CWS, Kim RN, BSN Entered By: Gretta Cool, BSN, RN, CWS, Kim on 04/22/2021 08:54:15 Chad [name] ([id number]) -------------------------------------------------------------------------------- Multi Wound Chart Details Patient Name: Chad [name] Date of Service: 04/22/2021 9:00 AM Medical Record Number: [id number] Patient Account Number: [id number] Date of Birth/Sex: 1961/05/11 (60 y.o. M) Treating RN: Cornell Barman Primary Care Eudelia Hiltunen: Clayborn Bigness Other Clinician: Referring Galena Logie: Clayborn Bigness Treating Efrem Pitstick/Extender: Tito Dine in Treatment: 35 Vital Signs Height(in): 50 Pulse(bpm): 100 Weight(lbs): 160 Blood Pressure(mmHg): 120/77 Body Mass Index(BMI): 25 Temperature(F): 99.1 Respiratory Rate(breaths/min): 16 Photos: [N/A:N/A] Wound Location: Left, Medial Ankle N/A N/A Wounding Event: Gradually Appeared N/A N/A Primary Etiology: Venous Leg Ulcer N/A N/A Comorbid History:  Anemia, Chronic Obstructive N/A N/A Pulmonary Disease (COPD), Hepatitis C, Received Chemotherapy Date Acquired: 07/12/2019 N/A N/A Weeks of Treatment: 35 N/A N/A Wound Status: Open N/A N/A Measurements L x W x D (cm) 1.6x1.1x0.2 N/A N/A Area (cm) : 1.382 N/A N/A Volume (cm) : 0.276 N/A N/A % Reduction in Area: 76.50% N/A N/A % Reduction in Volume: 53.10% N/A N/A Classification: Full Thickness Without Exposed N/A N/A Support Structures Exudate Amount: Medium N/A N/A Exudate Type: Serosanguineous N/A N/A Exudate Color: red,  brown N/A N/A Wound Margin: Flat and Intact N/A N/A Granulation Amount: Medium (34-66%) N/A N/A Granulation Quality: Pink N/A N/A Necrotic Amount: Medium (34-66%) N/A N/A Exposed Structures: Fat Layer (Subcutaneous Tissue): N/A N/A Yes Fascia: No Tendon: No Muscle: No Joint: No Bone: No Epithelialization: None N/A N/A Treatment Notes Electronic Signature(s) Signed: 04/22/2021 4:23:07 PM By: Linton Ham MD Entered By: Linton Ham on 04/22/2021 09:10:49 Chad [name] ([id number]) -------------------------------------------------------------------------------- Multi-Disciplinary Care Plan Details Patient Name: Chad [name] Date of Service: 04/22/2021 9:00 AM Medical Record Number: [id number] Patient Account Number: [id number] Date of Birth/Sex: Feb 02, 1961 (60 y.o. M) Treating RN: Cornell Barman Primary Care Aldair Rickel: Clayborn Bigness Other Clinician: Referring Keshana Klemz: Clayborn Bigness Treating Ruthanne Mcneish/Extender: Tito Dine in Treatment: 68 Active Inactive Electronic Signature(s) Signed: 04/22/2021 4:30:56 PM By: Gretta Cool, BSN, RN, CWS, Kim RN, BSN Entered By: Gretta Cool, BSN, RN, CWS, Kim on 04/22/2021 09:06:26 Chad [name] ([id number]) -------------------------------------------------------------------------------- Pain Assessment Details Patient Name: Chad [name] Date of Service: 04/22/2021 9:00 AM Medical Record Number:  [id number] Patient Account Number: [id number] Date of Birth/Sex: 1960/09/19 (60 y.o. M) Treating RN: Cornell Barman Primary Care Shamyah Stantz: Clayborn Bigness Other Clinician: Referring Remi Rester: Clayborn Bigness Treating Lynnell Fiumara/Extender: Tito Dine in Treatment: 90 Active Problems Location of Pain Severity and Description of Pain Patient Has Paino No Site Locations Pain Management and Medication Current Pain Management: Notes Patient denies pain at this time. Electronic Signature(s) Signed: 04/22/2021 4:30:56 PM By: Gretta Cool, BSN, RN, CWS, Kim RN, BSN Entered By: Gretta Cool, BSN, RN, CWS, Kim on 04/22/2021 08:46:34 Chad [name] ([id number]) -------------------------------------------------------------------------------- Patient/Caregiver Education Details Patient Name: Chad [name] Date of Service: 04/22/2021 9:00 AM Medical Record Number: [id number] Patient Account Number: [id number] Date of Birth/Gender: Mar 31, 1961 (60 y.o. M) Treating RN: Cornell Barman Primary Care Physician: Clayborn Bigness Other Clinician: Referring Physician: Clayborn Bigness Treating Physician/Extender: Tito Dine in Treatment: 61 Education Assessment Education Provided To: Patient Education Topics Provided Wound/Skin Impairment: Handouts: Caring for Your Ulcer Methods: Demonstration, Explain/Verbal Responses: State content correctly Electronic Signature(s) Signed: 04/22/2021 4:30:56 PM By: Gretta Cool, BSN, RN, CWS, Kim RN, BSN Entered By: Gretta Cool, BSN, RN, CWS, Kim on 04/22/2021 09:13:46 Chad [name] ([id number]) -------------------------------------------------------------------------------- Wound Assessment Details Patient Name: Chad [name] Date of Service: 04/22/2021 9:00 AM Medical Record Number: [id number] Patient Account Number: [id number] Date of Birth/Sex: 10-05-1960 (60 y.o. M) Treating RN: Cornell Barman Primary Care Jumar Greenstreet: Clayborn Bigness Other Clinician: Referring Halie Gass: Clayborn Bigness Treating  Teryn Gust/Extender: Tito Dine in Treatment: 35 Wound Status Wound Number: 8 Primary Venous Leg Ulcer Etiology: Wound Location: Left, Medial Ankle Wound Open Wounding Event: Gradually Appeared Status: Date Acquired: 07/12/2019 Comorbid Anemia, Chronic Obstructive Pulmonary Disease (COPD), Weeks Of Treatment: 35 History: Hepatitis C, Received Chemotherapy Clustered Wound: No Photos Wound Measurements Length: (cm) 1.6 Width: (cm) 1.1 Depth: (cm) 0.2 Area: (cm) 1.382 Volume: (cm) 0.276 % Reduction in Area: 76.5% % Reduction in Volume: 53.1% Epithelialization: None Tunneling: No Undermining: No Wound Description Classification: Full Thickness Without Exposed Support Structures Wound Margin: Flat and Intact Exudate Amount: Medium Exudate Type: Serosanguineous Exudate Color: red, brown Foul Odor After Cleansing: No Slough/Fibrino Yes Wound Bed Granulation Amount: Medium (34-66%) Exposed Structure Granulation Quality: Pink Fascia Exposed: No Necrotic Amount: Medium (34-66%) Fat Layer (Subcutaneous Tissue) Exposed: Yes Necrotic Quality: Adherent Slough Tendon Exposed: No Muscle Exposed: No Joint Exposed: No Bone Exposed: No Treatment Notes Wound #8 (Ankle) Wound Laterality: Left, Medial Cleanser Normal Saline Discharge Instruction: Wash your hands with soap and water. Remove old dressing, discard into plastic bag  and place into trash. Cleanse the wound with Normal Saline prior to applying a clean dressing using gauze sponges, not tissues or cotton balls. Do not scrub or use excessive force. Pat dry using gauze sponges, not tissue or cotton balls. Chad [name], Chad [name] ([id number]) Peri-Wound Care Desitin Maximum Strength Ointment 4 (oz) Discharge Instruction: Apply zinc periwound Topical Primary Dressing Hydrofera Blue Ready Transfer Foam, 2.5x2.5 (in/in) Discharge Instruction: Apply Hydrofera Blue Ready to wound bed as directed (EXTRA SENT WITH PATIENT,  PLEASE USE) Secondary Dressing Kerlix 4.5 x 4.1 (in/yd) Discharge Instruction: Apply kerlix over ABD pad Secured With 55M [employer] Surgical Tape, 2x2 (in/yd) Discharge Instruction: Secure kerlix Compression Wrap Compression Stockings Add-Ons Electronic Signature(s) Signed: 04/22/2021 4:30:56 PM By: Gretta Cool, BSN, RN, CWS, Kim RN, BSN Entered By: Gretta Cool, BSN, RN, CWS, Kim on 04/22/2021 08:51:51 Chad [name] ([id number]) -------------------------------------------------------------------------------- [place] Details Patient Name: Chad [name] Date of Service: 04/22/2021 9:00 AM Medical Record Number: [id number] Patient Account Number: [id number] Date of Birth/Sex: 11-14-1960 (60 y.o. M) Treating RN: Cornell Barman Primary Care Ura Hausen: Clayborn Bigness Other Clinician: Referring Birgit Nowling: Clayborn Bigness Treating Kortnie Stovall/Extender: Tito Dine in Treatment: 35 Vital Signs Time Taken: 08:45 Temperature (F): 99.1 Height (in): 67 Pulse (bpm): 86 Weight (lbs): 160 Respiratory Rate (breaths/min): 16 Body Mass Index (BMI): 25.1 Blood Pressure (mmHg): 120/77 Reference Range: 80 - 120 mg / dl Electronic Signature(s) Signed: 04/22/2021 4:30:56 PM By: Gretta Cool, BSN, RN, CWS, Kim RN, BSN Entered By: Gretta Cool, BSN, RN, CWS, Kim on 04/22/2021 08:45:46

## 2021-04-22 NOTE — Progress Notes (Signed)
ARLO, BUTT ([id number]) Visit Report for 04/22/2021 HPI Details Patient Name: Chad [name], Chad [name] Date of Service: 04/22/2021 9:00 AM Medical Record Number: [id number] Patient Account Number: [id number] Date of Birth/Sex: 1960-09-03 (60 y.o. M) Treating RN: Cornell Barman Primary Care Provider: Clayborn Bigness Other Clinician: Referring Provider: Clayborn Bigness Treating Provider/Extender: Tito Dine in Treatment: 35 History of Present Illness HPI Description: 10/08/18 on evaluation today patient actually presents to our office for initial evaluation concerning wounds that he has of the bilateral lower extremities. 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 He also has bilateral venous stasis. He apparently has been homeless currently following discharge from the hospital apparently he has been placed at [facility] which is is a skilled nursing facility locally. Nonetheless fortunately he does not show any signs of infection at this time which is good news. In fact several of the wound actually appears to be showing some signs of improvement already in my pinion. There are a couple areas in the left leg in particular there likely gonna require some sharp debridement to help clear away some necrotic tissue and help with more sufficient healing. No fevers, chills, nausea, or vomiting noted at this time. 10/15/18 on evaluation today patient actually appears to be doing very well in regard to his bilateral lower extremities. He's been tolerating the dressing changes without complication. Fortunately there does not appear to be any evidence of active infection at this time which is great news. Overall I'm actually very pleased with how this has progressed in just one visits time. Readmission: 08/14/2020 upon evaluation today patient  presents for re-evaluation here in our clinic. He is having issues with his left ankle region as well as his right toe and his right heel. He tells me that the toe and heel actually began as a area that was itching that he was scratching and then subsequently opened up into wounds. These may have been abscess areas I presume based on what I am seeing currently. With regard to his left ankle region he tells me this was a similar type occurrence although he does have venous stasis this very well may be more of a venous leg ulcer more than anything. Nonetheless I do believe that the patient would benefit from appropriate and aggressive wound care to try to help get things under better control here. He does have history of a stroke on the left side affecting him to some degree there that he is able to stand although he does have some residual weakness. Otherwise again the patient does have chronic venous insufficiency as previously noted. His arterial studies most recently obtained showed that he had an ABI on the right of 1.16 with a TBI of 0.52 and on the left and ABI of 1.14 with a TBI of 0.81. That was obtained on 06/19/2020. 08/28/2020 upon evaluation today patient appears to be doing decently well in regard to his wounds in general. He has been tolerating the dressing changes without complication. Fortunately there does not appear to be any signs of active infection which is great news. With that being said I think the [facility] is doing a good job I would recommend that we likely continue with that currently. 09/11/2020 upon evaluation today patient's wounds did not appear to be doing too poorly but again he is not really showing signs of significant improvement  with regard to any of the wounds on the right. None of them have Hydrofera Blue on them I am not exactly sure why this is not being followed as the facility did not contact us to let us know of any issues with obtaining dressings or  otherwise. With that being said he is supposed to be using Hydrofera Blue on both of the wounds on the right foot as well as the ankle wound on the left side. 09/18/2020 upon evaluation today patient appears to be doing poorly with regard to his wounds. Again right now the left ankle in particular showed signs of extreme maceration. Apparently he was told by someone with staff at [facility] they could not get the [facility]. With that being said this is something that is never been relayed to [place] one way or another. Also the patient subsequently has not supposed to have a border gauze dressing on. He should have an ABD pad and roll gauze to secure as this drains much too much just to have a border gauze dressing to cover. Nonetheless the fact that they are not using the appropriate dressing is directly causing deterioration of the left ankle wound it is significantly worse today compared to what it was previous. I did attempt to call [facility] while the patient was here I called three times and got no one to even pick up the phone. After this I had my for an office coordinator call and she was able to finally get through and leave a message with the D ON as of dictation of this note which is roughly about an hour and a half later I still have not been able to speak with anyone at the facility. 09/25/2020 upon evaluation today patient actually showing signs of good improvement which is excellent news. He has been tolerating the dressing changes without complication. Fortunately there is no signs of active infection which is great news. No fevers, chills, nausea, vomiting, or diarrhea. I do feel like the facility has been doing a much better job at taking care of him as far as the dressings are concerned. However the director of nursing never did call me back. 10/09/2020 upon evaluation today patient appears to be doing well with regard to his wound. The toe ulcer did require some  debridement but the other 2 areas actually appear to be doing quite well. 10/19/2020 upon evaluation today patient actually appears to be doing very well in regard to his wounds. In fact the heel does appear to be completely healed. The toe is doing better in the medial ankle on the left is also doing better. Overall I think he is headed in the right direction. 10/26/2020 upon evaluation today patient appears to be doing well with regard to his wound. He is showing signs of improvement which is great news and overall I am very pleased with where things stand today. No fevers, chills, nausea, vomiting, or diarrhea. 11/02/2020 upon evaluation today patient appears to be doing well with regard to his wounds. He has been tolerating the dressing changes without complication overall I am extremely pleased with where things stand today. He in regard to the toe is almost completely healed and the medial ankle on the left is doing much better. LAZLO, TUNNEY ([id number]) 11/09/2020 upon evaluation today patient appears to be doing a little poorly in regard to his left medial ankle ulcer. Fortunately there does not appear to be any signs of systemic infection but unfortunately locally he does  appear to be infected in fact he has blue-green drainage consistent with Pseudomonas. 11/16/2020 upon evaluation today patient appears to be doing well with regard to his wound. It actually appears to be doing better. I did place him on gentamicin cream since the Cipro was actually resistant even though he was positive for Pseudomonas on culture. Overall I think that he does seem to be doing better though I am unsure whether or not they have actually been putting the cream on. The patient is not sure that we did talk to the nurse directly and she was going to initiate that treatment. Fortunately there does not appear to be any signs of active infection at this time. No fevers, chills, nausea, vomiting, or diarrhea. 4/28; the  area on the right second toe is close to healed. Left medial ankle required debridement 12/07/2020 upon evaluation today patient appears to be doing well with regard to his wounds. In fact the right second toe appears to be completely healed which is great news. Fortunately there does not appear to be any signs of active infection at this time which is also great news. I think we can probably discontinue the gentamicin on top of everything else. 12/14/2020 upon evaluation today patient appears to be doing well with regard to his wound. He is making good progress and overall very pleased with where things stand today. There is no signs of active infection at this time which is great news. 12/28/2020 upon evaluation today patient appears to be doing well with regard to his wounds. He has been tolerating the dressing changes without complication. Fortunately there is no signs of active infection at this time. No fevers, chills, nausea, vomiting, or diarrhea. 12/28/2020 upon evaluation today patient's wound bed actually showed signs of excellent improvement. He has great epithelization and granulation I do not see any signs of infection overall I am extremely pleased with where things stand at this point. No fevers, chills, nausea, vomiting, or diarrhea. 01/11/2021 upon evaluation today patient appears to be doing well with regard to his wound on his leg. He has been tolerating the dressing changes without complication. Fortunately there does not appear to be any signs of active infection which is great news. No fevers, chills, nausea, vomiting, or diarrhea. 01/25/2021 upon evaluation today patient appears to be doing well with regard to his wound. He has been tolerating the dressing changes without complication. Fortunately the collagen seems to be doing a great job which is excellent news. No fevers, chills, nausea, vomiting, or diarrhea. 02/08/2021 upon evaluation today patient's wound is actually looking a  little bit worse especially in the periwound compared to previous. Fortunately there does not appear to be any signs of infection which is great news with that being said he does have some irritation around the periphery of the wound which has me more concerned. He actually had a dressing on that had not been changed in 3 days. He also is supposed to have daily dressing changes. With regard to the dressing applied he had a silver alginate dressing and silver collagen is what is recommended and ordered. He also had no Desitin around the edges of the wound in the periwound region although that is on the order inspect to be done as well. In general I was very concerned I did contact [facility] actually spoke with Magda Paganini who is the scheduling individual and subsequently she stated that she would pass the information to the D ON apparently the D ON was not  available to talk to me when I call today. 02/18/2021 upon evaluation today patient's wound is actually showing signs of improvement. Fortunately there does not appear to be any evidence of infection which is great news overall I am extremely pleased with where things stand today. No fevers, chills, nausea, vomiting, or diarrhea. 8/3; patient presents for 1 week follow-up. He has no issues or complaints today. He denies signs of infection. 03/11/2021 upon evaluation today patient appears to be doing well with regard to his wound. He does have a little bit of slough noted on the surface of the wound but fortunately there does not appear to be any signs of active infection at this time. No fevers, chills, nausea, vomiting, or diarrhea. 03/18/2021 upon evaluation today patient appears to be doing well with regard to his wound. He has been tolerating the dressing changes without complication. There was a little irritation more proximal to where the wound was that was not noted last week but nonetheless this is very superficial just seems to be more  irritation we just need to make sure to put a good amount of the zinc over the area in my opinion. Otherwise he does not seem to be doing significantly worse at all which is great news. 03/25/2021 upon evaluation today patient appears to be doing well with regard to his wound. He is going require some sharp debridement today to clear with some of the necrotic debris. I did perform this today without complication postdebridement wound bed appears to be doing much better this is great news. 04/08/2021 upon evaluation today patient appears to be doing decently well in regard to his wound although the overall measurement is not significantly smaller compared to previous. It is gone down a little bit but still the facility continues to not really put the appropriate dressings in place in fact he was supposed to have collagen we think he probably had more of an allergy to At this point. Fortunately there does not appear to be any signs of active infection systemically though locally I do not see anything on initial visualization either as far as erythema or warmth. 04/15/2021 upon evaluation today patient appears to be doing well with regard to his wound. He is actually showing signs of improvement. I did place him on antibiotics last week, Cipro. He has been taking that 2 times a day and seems to be tolerating it very well. I do not see any evidence of worsening and in fact the overall appearance of the wound is smaller today which is also great news. 9/26; left medial ankle chronic venous insufficiency wound is improved. Using [employer] Electronic Signature(s) Signed: 04/22/2021 4:23:07 PM By: Linton Ham MD Entered By: Linton Ham on 04/22/2021 09:11:42 [name] ([id number]) -------------------------------------------------------------------------------- Physical Exam Details Patient Name: [name] Date of Service: 04/22/2021 9:00 AM Medical Record Number: [id number] Patient  Account Number: [id number] Date of Birth/Sex: 04-20-1961 (60 y.o. M) Treating RN: Cornell Barman Primary Care Provider: Clayborn Bigness Other Clinician: Referring Provider: Clayborn Bigness Treating Provider/Extender: Tito Dine in Treatment: 35 Constitutional Sitting or standing Blood Pressure is within target range for patient.. Pulse regular and within target range for patient.Marland Kitchen Respirations regular, non- labored and within target range.. Temperature is normal and within the target range for the patient.Marland Kitchen appears in no distress. Notes Wound exam; under illumination tissue looks healthy. There is epithelialization. No evidence of surrounding infection. His edema control looked quite good he does have hemosiderin deposition. Apparently this area was  macerated last week the zinc oxide seems to have helped with this. Electronic Signature(s) Signed: 04/22/2021 4:23:07 PM By: Linton Ham MD Entered By: Linton Ham on 04/22/2021 09:12:55 [name] ([id number]) -------------------------------------------------------------------------------- Physician Orders Details Patient Name: [name] Date of Service: 04/22/2021 9:00 AM Medical Record Number: [id number] Patient Account Number: [id number] Date of Birth/Sex: 1961-02-22 (60 y.o. M) Treating RN: Cornell Barman Primary Care Provider: Clayborn Bigness Other Clinician: Referring Provider: Clayborn Bigness Treating Provider/Extender: Tito Dine in Treatment: 61 Verbal / Phone Orders: No Diagnosis Coding Follow-up Appointments o Return Appointment in 1 week. Bathing/ Shower/ Hygiene o May shower; gently cleanse wound with antibacterial soap, rinse and pat dry prior to dressing wounds Edema Control - Lymphedema / Segmental Compressive Device / Other o Elevate legs to the level of the heart and pump ankles as often as possible o Elevate leg(s) parallel to the floor when sitting. o DO YOUR BEST to sleep in the bed at  night. DO NOT sleep in your recliner. Long hours of sitting in a recliner leads to swelling of the legs and/or potential wounds on your backside. Medications-Please add to medication list. o P.O. Antibiotics - Continue as ordered Wound Treatment Wound #8 - Ankle Wound Laterality: Left, Medial Cleanser: Normal Saline 3 x Per Week/30 Days Discharge Instructions: Wash your hands with soap and water. Remove old dressing, discard into plastic bag and place into trash. Cleanse the wound with Normal Saline prior to applying a clean dressing using gauze sponges, not tissues or cotton balls. Do not scrub or use excessive force. Pat dry using gauze sponges, not tissue or cotton balls. Peri-Wound Care: Desitin Maximum Strength Ointment 4 (oz) 3 x Per Week/30 Days Discharge Instructions: Apply zinc periwound Primary Dressing: Hydrofera Blue Ready Transfer Foam, 2.5x2.5 (in/in) 3 x Per Week/30 Days Discharge Instructions: Apply Hydrofera Blue Ready to wound bed as directed (EXTRA SENT WITH PATIENT, PLEASE USE) Secondary Dressing: Kerlix 4.5 x 4.1 (in/yd) 3 x Per Week/30 Days Discharge Instructions: Apply kerlix over ABD pad Secured With: 52M Medipore H Soft Cloth Surgical Tape, 2x2 (in/yd) 3 x Per Week/30 Days Discharge Instructions: Secure kerlix Electronic Signature(s) Signed: 04/22/2021 4:23:07 PM By: Linton Ham MD Signed: 04/22/2021 4:30:56 PM By: Gretta Cool, BSN, RN, CWS, Kim RN, BSN Entered By: Gretta Cool, BSN, RN, CWS, Kim on 04/22/2021 09:10:06 [name] ([id number]) -------------------------------------------------------------------------------- Problem List Details Patient Name: [name] Date of Service: 04/22/2021 9:00 AM Medical Record Number: [id number] Patient Account Number: [id number] Date of Birth/Sex: [birth year] (60 y.o. 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 08/14/2020 No Yes exposed L97.512 Non-pressure chronic ulcer of other part of right foot with fat layer 08/14/2020 No Yes exposed 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 M) Treating RN: Cornell Barman Primary Care Provider: Clayborn Bigness Other Clinician: Referring  Provider: Clayborn Bigness Treating Provider/Extender: Tito Dine in Treatment: 35 Subjective History of Present Illness (HPI) 10/08/18 on evaluation today patient actually presents to our office for initial evaluation concerning wounds that he has of the bilateral lower extremities. 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 He also has bilateral venous stasis. He apparently has been homeless currently following discharge from the hospital apparently he has been placed at [facility] which is is a skilled nursing facility locally. Nonetheless fortunately he does not show any signs of infection at this  time which is good news. In fact several of the wound actually appears to be showing some signs of improvement already in my pinion. There are a couple areas in the left leg in particular there likely gonna require some sharp debridement to help clear away some necrotic tissue and help with more sufficient healing. No fevers, chills, nausea, or vomiting noted at this time. 10/15/18 on evaluation today patient actually appears to be doing very well in regard to his bilateral lower extremities. He's been tolerating the dressing changes without complication. Fortunately there does not appear to be any evidence of active infection at this time which is great news. Overall I'm actually very pleased with how this has progressed in just one visits time. Readmission: 08/14/2020 upon evaluation today patient presents for re-evaluation here in our clinic. He is having issues with his left ankle region as well as his right toe and his right heel. He tells me that the toe and heel actually began as a area that was itching that he was scratching and then subsequently opened up into wounds. These may have been abscess areas I presume based on what I am seeing currently. With regard to his left ankle region he tells me this was a similar type occurrence although he does have venous stasis this very well may be more of a venous leg ulcer more than anything. Nonetheless I do believe that the patient would benefit from appropriate and aggressive wound care to try to help get things under better control here. He does have history of a stroke on the left side affecting him to some degree there that he is able to stand although he does have some residual weakness. Otherwise again the patient does have chronic venous insufficiency as previously noted. His arterial studies most recently obtained showed that he had an ABI on the right of 1.16 with a TBI of 0.52 and on the left and ABI of 1.14 with a TBI of 0.81. That was  obtained on 06/19/2020. 08/28/2020 upon evaluation today patient appears to be doing decently well in regard to his wounds in general. He has been tolerating the dressing changes without complication. Fortunately there does not appear to be any signs of active infection which is great news. With that being said I think the [facility] is doing a good job I would recommend that we likely continue with that currently. 09/11/2020 upon evaluation today patient's wounds did not appear to be doing too poorly but again he is not really showing signs of significant improvement with regard to any of the wounds on the right. None of them have Hydrofera Blue on them I am not exactly sure why this is not being followed as the facility did not contact us to let us know of any issues with obtaining  dressings or otherwise. With that being said he is supposed to be using Hydrofera Blue on both of the wounds on the right foot as well as the ankle wound on the left side. 09/18/2020 upon evaluation today patient appears to be doing poorly with regard to his wounds. Again right now the left ankle in particular showed signs of extreme maceration. Apparently he was told by someone with staff at [facility] they could not get the [facility]. With that being said this is something that is never been relayed to [place] one way or another. Also the patient subsequently has not supposed to have a border gauze dressing on. He should have an ABD pad and roll gauze to secure as this drains much too much just to have a border gauze dressing to cover. Nonetheless the fact that they are not using the appropriate dressing is directly causing deterioration of the left ankle wound it is significantly worse today compared to what it was previous. I did attempt to call [facility] while the patient was here I called three times and got no one to even pick up the phone. After this I had my for an office coordinator call and  she was able to finally get through and leave a message with the D ON as of dictation of this note which is roughly about an hour and a half later I still have not been able to speak with anyone at the facility. 09/25/2020 upon evaluation today patient actually showing signs of good improvement which is excellent news. He has been tolerating the dressing changes without complication. Fortunately there is no signs of active infection which is great news. No fevers, chills, nausea, vomiting, or diarrhea. I do feel like the facility has been doing a much better job at taking care of him as far as the dressings are concerned. However the director of nursing never did call me back. 10/09/2020 upon evaluation today patient appears to be doing well with regard to his wound. The toe ulcer did require some debridement but the other 2 areas actually appear to be doing quite well. 10/19/2020 upon evaluation today patient actually appears to be doing very well in regard to his wounds. In fact the heel does appear to be completely healed. The toe is doing better in the medial ankle on the left is also doing better. Overall I think he is headed in the right direction. 10/26/2020 upon evaluation today patient appears to be doing well with regard to his wound. He is showing signs of improvement which is great news and overall I am very pleased with where things stand today. No fevers, chills, nausea, vomiting, or diarrhea. 11/02/2020 upon evaluation today patient appears to be doing well with regard to his wounds. He has been tolerating the dressing changes without complication overall I am extremely pleased with where things stand today. He in regard to the toe is almost completely healed and the medial ankle on the left is doing much better. 11/09/2020 upon evaluation today patient appears to be doing a little poorly in regard to his left medial ankle ulcer. Fortunately there does not appear to be any signs of systemic  infection but unfortunately locally he does appear to be infected in fact he has blue-green drainage consistent with Pseudomonas. NAZARETH, KIRK ([id number]) 11/16/2020 upon evaluation today patient appears to be doing well with regard to his wound. It actually appears to be doing better. I did place him on gentamicin cream since the Cipro  was actually resistant even though he was positive for Pseudomonas on culture. Overall I think that he does seem to be doing better though I am unsure whether or not they have actually been putting the cream on. The patient is not sure that we did talk to the nurse directly and she was going to initiate that treatment. Fortunately there does not appear to be any signs of active infection at this time. No fevers, chills, nausea, vomiting, or diarrhea. 4/28; the area on the right second toe is close to healed. Left medial ankle required debridement 12/07/2020 upon evaluation today patient appears to be doing well with regard to his wounds. In fact the right second toe appears to be completely healed which is great news. Fortunately there does not appear to be any signs of active infection at this time which is also great news. I think we can probably discontinue the gentamicin on top of everything else. 12/14/2020 upon evaluation today patient appears to be doing well with regard to his wound. He is making good progress and overall very pleased with where things stand today. There is no signs of active infection at this time which is great news. 12/28/2020 upon evaluation today patient appears to be doing well with regard to his wounds. He has been tolerating the dressing changes without complication. Fortunately there is no signs of active infection at this time. No fevers, chills, nausea, vomiting, or diarrhea. 12/28/2020 upon evaluation today patient's wound bed actually showed signs of excellent improvement. He has great epithelization and granulation I do not see any  signs of infection overall I am extremely pleased with where things stand at this point. No fevers, chills, nausea, vomiting, or diarrhea. 01/11/2021 upon evaluation today patient appears to be doing well with regard to his wound on his leg. He has been tolerating the dressing changes without complication. Fortunately there does not appear to be any signs of active infection which is great news. No fevers, chills, nausea, vomiting, or diarrhea. 01/25/2021 upon evaluation today patient appears to be doing well with regard to his wound. He has been tolerating the dressing changes without complication. Fortunately the collagen seems to be doing a great job which is excellent news. No fevers, chills, nausea, vomiting, or diarrhea. 02/08/2021 upon evaluation today patient's wound is actually looking a little bit worse especially in the periwound compared to previous. Fortunately there does not appear to be any signs of infection which is great news with that being said he does have some irritation around the periphery of the wound which has me more concerned. He actually had a dressing on that had not been changed in 3 days. He also is supposed to have daily dressing changes. With regard to the dressing applied he had a silver alginate dressing and silver collagen is what is recommended and ordered. He also had no Desitin around the edges of the wound in the periwound region although that is on the order inspect to be done as well. In general I was very concerned I did contact [facility] actually spoke with Magda Paganini who is the scheduling individual and subsequently she stated that she would pass the information to the D ON apparently the D ON was not available to talk to me when I call today. 02/18/2021 upon evaluation today patient's wound is actually showing signs of improvement. Fortunately there does not appear to be any evidence of infection which is great news overall I am extremely pleased with  where things stand  today. No fevers, chills, nausea, vomiting, or diarrhea. 8/3; patient presents for 1 week follow-up. He has no issues or complaints today. He denies signs of infection. 03/11/2021 upon evaluation today patient appears to be doing well with regard to his wound. He does have a little bit of slough noted on the surface of the wound but fortunately there does not appear to be any signs of active infection at this time. No fevers, chills, nausea, vomiting, or diarrhea. 03/18/2021 upon evaluation today patient appears to be doing well with regard to his wound. He has been tolerating the dressing changes without complication. There was a little irritation more proximal to where the wound was that was not noted last week but nonetheless this is very superficial just seems to be more irritation we just need to make sure to put a good amount of the zinc over the area in my opinion. Otherwise he does not seem to be doing significantly worse at all which is great news. 03/25/2021 upon evaluation today patient appears to be doing well with regard to his wound. He is going require some sharp debridement today to clear with some of the necrotic debris. I did perform this today without complication postdebridement wound bed appears to be doing much better this is great news. 04/08/2021 upon evaluation today patient appears to be doing decently well in regard to his wound although the overall measurement is not significantly smaller compared to previous. It is gone down a little bit but still the facility continues to not really put the appropriate dressings in place in fact he was supposed to have collagen we think he probably had more of an allergy to At this point. Fortunately there does not appear to be any signs of active infection systemically though locally I do not see anything on initial visualization either as far as erythema or warmth. 04/15/2021 upon evaluation today patient appears to be  doing well with regard to his wound. He is actually showing signs of improvement. I did place him on antibiotics last week, Cipro. He has been taking that 2 times a day and seems to be tolerating it very well. I do not see any evidence of worsening and in fact the overall appearance of the wound is smaller today which is also great news. 9/26; left medial ankle chronic venous insufficiency wound is improved. Using Hydrofera Blue Objective Constitutional Sitting or standing Blood Pressure is within target range for patient.. Pulse regular and within target range for patient.Marland Kitchen Respirations regular, non- [place], James ([id number]) labored and within target range.. Temperature is normal and within the target range for the patient.Marland Kitchen appears in no distress. Vitals Time Taken: 8:45 AM, Height: 67 in, Weight: 160 lbs, BMI: 25.1, Temperature: 99.1 F, Pulse: 86 bpm, Respiratory Rate: 16 breaths/min, Blood Pressure: 120/77 mmHg. General Notes: Wound exam; under illumination tissue looks healthy. There is epithelialization. No evidence of surrounding infection. His edema control looked quite good he does have hemosiderin deposition. Apparently this area was macerated last week the zinc oxide seems to have helped with this. Integumentary (Hair, Skin) Wound #8 status is Open. Original cause of wound was Gradually Appeared. The date acquired was: 07/12/2019. The wound has been in treatment 35 weeks. The wound is located on the Left,Medial Ankle. The wound measures 1.6cm length x 1.1cm width x 0.2cm depth; 1.382cm^2 area and 0.276cm^3 volume. There is Fat Layer (Subcutaneous Tissue) exposed. There is no tunneling or undermining noted. There is a medium amount of serosanguineous drainage noted.  The wound margin is flat and intact. There is medium (34-66%) pink granulation within the wound bed. There is a medium (34-66%) amount of necrotic tissue within the wound bed including Adherent  Slough. 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 Bathing/ Shower/ Hygiene: May shower; gently cleanse wound with antibacterial soap, rinse and pat dry prior to dressing wounds Edema Control - Lymphedema / Segmental Compressive Device / Other: Elevate legs to the level of the heart and pump ankles as often as possible Elevate leg(s) parallel to the floor when sitting. DO YOUR BEST to sleep in the bed at night. DO NOT sleep in your recliner. Long hours of sitting in a recliner leads to swelling of the legs and/or potential wounds on your backside. Medications-Please add to medication list.: P.O. Antibiotics - Continue as ordered WOUND #8: - Ankle Wound Laterality: Left, Medial Cleanser: Normal Saline 3 x Per Week/30 Days Discharge Instructions: Wash your hands with soap and water. Remove old dressing, discard into plastic bag and place into trash. Cleanse the wound with Normal Saline prior to applying a clean dressing using gauze sponges, not tissues or cotton balls. Do not scrub or use excessive force. Pat dry using gauze sponges, not tissue or cotton balls. Peri-Wound Care: Desitin Maximum Strength Ointment 4 (oz) 3 x Per Week/30 Days Discharge Instructions: Apply zinc periwound Primary Dressing: Hydrofera Blue Ready Transfer Foam, 2.5x2.5 (in/in) 3 x Per Week/30 Days Discharge Instructions: Apply Hydrofera Blue Ready to wound bed as directed (EXTRA SENT WITH PATIENT, PLEASE USE) Secondary Dressing: Kerlix 4.5 x 4.1 (in/yd) 3 x Per Week/30 Days Discharge Instructions: Apply kerlix over  ABD pad Secured With: 37M Medipore H Soft Cloth Surgical Tape, 2x2 (in/yd) 3 x Per Week/30 Days Discharge Instructions: Secure kerlix 1. Seems little reason to change the primary dressing the wound is looking better. 2. No debridement required wound surface appears healthy 3. The patient is at [facility]. They are changing the dressing. Follow-up in 2 weeks Electronic Signature(s) Signed: 04/22/2021 4:23:07 PM By: Linton Ham MD Entered By: Linton Ham on 04/22/2021 09:13:56 [name] ([id number]) DEVARION, MCCLANAHAN ([id number]) -------------------------------------------------------------------------------- [facility] Details Patient Name: [name] Date of Service: 04/22/2021 Medical Record Number: [id number] Patient Account Number: [id number] Date of Birth/Sex: 23-Jul-1961 (60 y.o. M) Treating RN: Cornell Barman Primary Care Provider: Clayborn Bigness Other Clinician: Referring Provider: Clayborn Bigness Treating Provider/Extender: Tito Dine in Treatment: 35 Diagnosis Coding ICD-10 Codes Code Description I87.2 Venous insufficiency (chronic) (peripheral) L97.322 Non-pressure chronic ulcer of left ankle with fat layer exposed L97.412 Non-pressure chronic ulcer of right heel and midfoot with fat layer exposed L97.512 Non-pressure chronic ulcer of other part of right foot with fat layer exposed 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 side Facility Procedures CPT4 Code: [id number] Description: 99213 - WOUND CARE VISIT-LEV 3 EST PT Modifier: Quantity: 1 Physician Procedures CPT4 Code: [id number] Description: 99213 - WC PHYS LEVEL 3 - EST PT Modifier: Quantity: 1 CPT4 Code: Description: ICD-10 Diagnosis Description M76.720 Non-pressure chronic ulcer of left ankle with fat layer exposed I87.2 Venous insufficiency (chronic) (peripheral) Modifier: Quantity: Electronic Signature(s) Signed: 04/22/2021 4:23:07 PM By:  Linton Ham MD Entered By: Linton Ham  on 04/22/2021 09:14:47

## 2021-04-29 ENCOUNTER — Encounter: Payer: Medicaid Other | Admitting: Internal Medicine

## 2021-04-30 ENCOUNTER — Ambulatory Visit
Admission: RE | Admit: 2021-04-30 | Discharge: 2021-04-30 | Disposition: A | Discharge status: Home / Self Care | Payer: Medicaid Other | Source: Ambulatory Visit | Attending: Internal Medicine | Admitting: Internal Medicine

## 2021-04-30 ENCOUNTER — Other Ambulatory Visit: Payer: Self-pay

## 2021-04-30 DIAGNOSIS — C661 Malignant neoplasm of right ureter: Secondary | ICD-10-CM | POA: Diagnosis not present

## 2021-04-30 LAB — POCT I-STAT CREATININE: Creatinine, Ser: 1 mg/dL (ref 0.61–1.24)

## 2021-04-30 MED ORDER — IOHEXOL 350 MG/ML SOLN
75.0000 mL | Freq: Once | INTRAVENOUS | Status: AC | PRN
  Administered 2021-04-30: 75 mL via INTRAVENOUS

## 2021-04-30 MED ORDER — IOHEXOL 350 MG/ML SOLN: 100.0000 mL | Freq: Once | INTRAVENOUS | Status: DC | PRN

## 2021-05-01 ENCOUNTER — Inpatient Hospital Stay (HOSPITAL_BASED_OUTPATIENT_CLINIC_OR_DEPARTMENT_OTHER): Payer: Medicaid Other | Admitting: Internal Medicine

## 2021-05-01 ENCOUNTER — Inpatient Hospital Stay: Payer: Medicaid Other

## 2021-05-01 ENCOUNTER — Inpatient Hospital Stay: Payer: Medicaid Other | Attending: Internal Medicine

## 2021-05-01 DIAGNOSIS — Z15068 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 of digestive system: Secondary | ICD-10-CM

## 2021-05-01 DIAGNOSIS — C661 Malignant neoplasm of right ureter: Secondary | ICD-10-CM | POA: Insufficient documentation

## 2021-05-01 DIAGNOSIS — Z1509 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: Secondary | ICD-10-CM

## 2021-05-01 DIAGNOSIS — Z7189 Other specified counseling: Secondary | ICD-10-CM

## 2021-05-01 DIAGNOSIS — Z515 Encounter for palliative care: Secondary | ICD-10-CM

## 2021-05-01 DIAGNOSIS — D500 Iron deficiency anemia secondary to blood loss (chronic): Secondary | ICD-10-CM

## 2021-05-01 DIAGNOSIS — Z5112 Encounter for antineoplastic immunotherapy: Secondary | ICD-10-CM | POA: Diagnosis not present

## 2021-05-01 DIAGNOSIS — C182 Malignant neoplasm of ascending colon: Secondary | ICD-10-CM

## 2021-05-01 DIAGNOSIS — D509 Iron deficiency anemia, unspecified: Secondary | ICD-10-CM | POA: Insufficient documentation

## 2021-05-01 LAB — COMPREHENSIVE METABOLIC PANEL
ALT: 27 U/L (ref 0–44)
AST: 38 U/L (ref 15–41)
Albumin: 3.7 g/dL (ref 3.5–5.0)
Alkaline Phosphatase: 125 U/L (ref 38–126)
Anion gap: 7 (ref 5–15)
BUN: 21 mg/dL (ref 8–23)
CO2: 26 mmol/L (ref 22–32)
Calcium: 8.5 mg/dL — ABNORMAL LOW (ref 8.9–10.3)
Chloride: 104 mmol/L (ref 98–111)
Creatinine, Ser: 0.87 mg/dL (ref 0.61–1.24)
GFR, Estimated: >60 mL/min (ref >60)
Glucose, Bld: 84 mg/dL (ref 70–99)
Potassium: 4.1 mmol/L (ref 3.5–5.1)
Sodium: 137 mmol/L (ref 135–145)
Total Bilirubin: 0.5 mg/dL (ref 0.3–1.2)
Total Protein: 7.7 g/dL (ref 6.5–8.1)

## 2021-05-01 LAB — CBC WITH DIFFERENTIAL/PLATELET
Abs Immature Granulocytes: 0.04 10*3/uL (ref 0.00–0.07)
Basophils Absolute: 0.1 10*3/uL (ref 0.0–0.1)
Basophils Relative: 1 %
Eosinophils Absolute: 0.4 10*3/uL (ref 0.0–0.5)
Eosinophils Relative: 5 %
HCT: 37.2 % — ABNORMAL LOW (ref 39.0–52.0)
Hemoglobin: 11.7 g/dL — ABNORMAL LOW (ref 13.0–17.0)
Immature Granulocytes: 1 %
Lymphocytes Relative: 18 %
Lymphs Abs: 1.4 10*3/uL (ref 0.7–4.0)
MCH: 24.8 pg — ABNORMAL LOW (ref 26.0–34.0)
MCHC: 31.5 g/dL (ref 30.0–36.0)
MCV: 78.8 fL — ABNORMAL LOW (ref 80.0–100.0)
Monocytes Absolute: 0.5 10*3/uL (ref 0.1–1.0)
Monocytes Relative: 7 %
Neutro Abs: 5.2 10*3/uL (ref 1.7–7.7)
Neutrophils Relative %: 68 %
Platelets: 185 10*3/uL (ref 150–400)
RBC: 4.72 MIL/uL (ref 4.22–5.81)
RDW: 17.6 % — ABNORMAL HIGH (ref 11.5–15.5)
WBC: 7.6 10*3/uL (ref 4.0–10.5)
nRBC: 0 % (ref 0.0–0.2)

## 2021-05-01 MED ORDER — IRON SUCROSE 20 MG/ML IV SOLN
200.0000 mg | Freq: Once | INTRAVENOUS | Status: AC
Start: 2021-05-01 — End: 2021-05-01
  Administered 2021-05-01: 200 mg via INTRAVENOUS
  Filled 2021-05-01: qty 10

## 2021-05-01 MED ORDER — HEPARIN SOD (PORK) LOCK FLUSH 100 UNIT/ML IV SOLN
INTRAVENOUS | Status: AC
  Filled 2021-05-01: qty 5

## 2021-05-01 MED ORDER — SODIUM CHLORIDE 0.9% FLUSH
10.0000 mL | INTRAVENOUS | Status: DC | PRN
  Administered 2021-05-01: 10 mL via INTRAVENOUS
  Filled 2021-05-01: qty 10

## 2021-05-01 MED ORDER — HEPARIN SOD (PORK) LOCK FLUSH 100 UNIT/ML IV SOLN
500.0000 [IU] | Freq: Once | INTRAVENOUS | Status: AC
  Administered 2021-05-01: 500 [IU] via INTRAVENOUS
  Filled 2021-05-01: qty 5

## 2021-05-01 MED ORDER — HEPARIN SOD (PORK) LOCK FLUSH 100 UNIT/ML IV SOLN
500.0000 [IU] | Freq: Once | INTRAVENOUS | Status: DC | PRN
  Filled 2021-05-01: qty 5

## 2021-05-01 MED ORDER — SODIUM CHLORIDE 0.9 % IV SOLN
Freq: Once | INTRAVENOUS | Status: AC
  Filled 2021-05-01: qty 250

## 2021-05-01 MED ORDER — SODIUM CHLORIDE 0.9 % IV SOLN
200.0000 mg | Freq: Once | INTRAVENOUS | Status: AC
  Administered 2021-05-01: 200 mg via INTRAVENOUS
  Filled 2021-05-01: qty 8

## 2021-05-01 MED ORDER — SODIUM CHLORIDE 0.9% FLUSH
10.0000 mL | INTRAVENOUS | Status: DC | PRN
Start: 2021-05-01 — End: 2021-05-01
  Filled 2021-05-01: qty 10

## 2021-05-01 NOTE — Progress Notes (Signed)
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  Is alone/in a wheelchair.  Chad [name] 60 y.o.  male above history of stage IV-ureteral cancer/history of stage III colon cancer right side; recurrent sigmoid colon cancer [un-resected/under surveillance] and multiple other comorbidities currently on Keytruda is here for follow-up.  No nausea no vomiting.  No abdominal pain.  No blood in urine.  Chronic shortness of breath.  Patient awaiting evaluation with physician-for the cyst on his right elbow.   Review of Systems  Constitutional:  Positive for malaise/fatigue. Negative for chills, diaphoresis, fever and weight  loss.  HENT:  Negative for nosebleeds and sore throat.   Eyes:  Negative for double vision.  Respiratory:  Positive for cough and shortness of breath. Negative for hemoptysis and wheezing.   Cardiovascular:  Negative for chest pain, palpitations and orthopnea.  Gastrointestinal:  Positive for constipation. Negative for abdominal pain, blood in stool, diarrhea, heartburn, melena, nausea and vomiting.  Genitourinary:  Negative for dysuria, frequency and urgency.  Musculoskeletal:  Positive for back pain and joint pain.  Skin: Negative.  Negative for itching and rash.  Neurological:  Positive for focal weakness. Negative for dizziness, tingling, weakness and headaches.       Chronic left-sided weakness upper than lower extremity.  Endo/Heme/Allergies:  Does not bruise/bleed easily.  Psychiatric/Behavioral:  Negative for depression. The patient is not nervous/anxious and does not have insomnia.     MEDICAL HISTORY:  Past Medical History:  Diagnosis Date   Anemia    Anxiety    ARF (acute respiratory failure) (HCC)    Bladder cancer (HCC)    COPD (chronic obstructive pulmonary disease) ([place])    Depression    Dysphagia    Family history of colon cancer    Family history of kidney cancer    Family history of leukemia    Family history of prostate cancer    GERD (gastroesophageal reflux disease)    Hepatitis    chronic hep c   Hydronephrosis    Hydronephrosis with ureteral stricture    Hyperlipidemia    Knee pain    Left   Malignant neoplasm of colon ([place])    Nerve pain    Peripheral vascular disease ([place])    Prostate cancer ([place])  Stroke [facility])    Urinary frequency    Venous hypertension of both lower extremities     SURGICAL HISTORY: Past Surgical History:  Procedure Laterality Date   COLON SURGERY     En bloc extended right hemicolectomy 07/2017   COLONOSCOPY WITH PROPOFOL N/A 11/06/2020   Procedure: COLONOSCOPY WITH PROPOFOL;  Surgeon: Jonathon Bellows, MD;  Location: [facility]  ENDOSCOPY;  Service: Gastroenterology;  Laterality: N/A;   CYSTOSCOPY W/ RETROGRADES Right 08/30/2018   Procedure: CYSTOSCOPY WITH RETROGRADE PYELOGRAM;  Surgeon: Hollice Espy, MD;  Location: [facility] ORS;  Service: Urology;  Laterality: Right;   CYSTOSCOPY WITH STENT PLACEMENT Right 04/25/2018   Procedure: CYSTOSCOPY WITH STENT PLACEMENT;  Surgeon: Hollice Espy, MD;  Location: [facility] ORS;  Service: Urology;  Laterality: Right;   CYSTOSCOPY WITH STENT PLACEMENT Right 08/30/2018   Procedure: [place];  Surgeon: Hollice Espy, MD;  Location: [facility] ORS;  Service: Urology;  Laterality: Right;   CYSTOSCOPY WITH STENT PLACEMENT Right 03/07/2019   Procedure: CYSTOSCOPY WITH STENT [place];  Surgeon: Hollice Espy, MD;  Location: [facility] ORS;  Service: Urology;  Laterality: Right;   CYSTOSCOPY WITH STENT PLACEMENT Right 11/21/2019   Procedure: CYSTOSCOPY WITH STENT [place];  Surgeon: Hollice Espy, MD;  Location: [facility] ORS;  Service: Urology;  Laterality: Right;   LOWER EXTREMITY ANGIOGRAPHY Left 05/23/2019   Procedure: LOWER EXTREMITY ANGIOGRAPHY;  Surgeon: Algernon Huxley, MD;  Location: [facility];  Service: Cardiovascular;  Laterality: Left;   LOWER EXTREMITY ANGIOGRAPHY Right 05/30/2019   Procedure: LOWER EXTREMITY ANGIOGRAPHY;  Surgeon: Algernon Huxley, MD;  Location: [place] [facility];  Service: Cardiovascular;  Laterality: Right;   LOWER EXTREMITY ANGIOGRAPHY Right 02/13/2020   Procedure: LOWER EXTREMITY ANGIOGRAPHY;  Surgeon: Algernon Huxley, MD;  Location: [place] [facility];  Service: Cardiovascular;  Laterality: Right;   LOWER EXTREMITY ANGIOGRAPHY Left 02/20/2020   Procedure: LOWER EXTREMITY ANGIOGRAPHY;  Surgeon: Algernon Huxley, MD;  Location: [facility] [facility];  Service: Cardiovascular;  Laterality: Left;   PORTA CATH INSERTION N/A 02/28/2019   Procedure: PORTA CATH INSERTION;  Surgeon: Algernon Huxley, MD;  Location: [facility];  Service: Cardiovascular;   Laterality: N/A;   tumor removed       SOCIAL HISTORY: Social History   Socioeconomic History   Marital status: Single    Spouse name: Not on file   Number of children: Not on file   Years of education: Not on file   Highest education level: Not on file  Occupational History   Not on file  Tobacco Use   Smoking status: Every Day    Packs/day: 1.00    Types: Cigarettes   Smokeless tobacco: Never  Vaping Use   Vaping Use: Never used  Substance and Sexual Activity   Alcohol use: Not Currently   Drug use: Not Currently   Sexual activity: Not Currently  Other Topics Concern   Not on file  Social History Narrative    used to live [place]; moved  To [place]- end of April 2019; in Nursing home; 1pp/day; quit alcohol. 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        Family- dad- prostate ca [at 36y]; brother- 33 died of prostate cancer; brother- 73- no cancers [[place]]; sonGerald Stabs [[place]];[name]-32y prostate ca [facility] [place] daughter- 54 [[place]]; another daughter 54 [[place]/addict]. will refer genetics counseling. Given MSI- abnormal; highly suspicious of Lynch syndrome.  Patient's son [name] aware of high possible lynch syndrome.  Social Determinants of Health   Financial Resource Strain: Not on file  Food Insecurity: Not on file  Transportation Needs: Not on file  Physical Activity: Not on file  Stress: Not on file  Social Connections: Not on file  Intimate Partner Violence: Not on file    FAMILY HISTORY: Family History  Problem Relation Age of Onset   Prostate cancer Father 38   Cancer Brother 7       unsure type   Cancer Paternal Uncle        unsure type   Cancer Maternal Grandmother        unsure type   Cancer Paternal Grandmother        unsure type   Kidney cancer Paternal Grandfather    Cancer Other        unsure types   Leukemia Son    Cancer Son        other cancers, possibly colon    ALLERGIES:  is allergic to penicillins.  MEDICATIONS:  Current Outpatient  Medications  Medication Sig Dispense Refill   acetaminophen (TYLENOL) 325 MG tablet Take 650 mg by mouth 3 (three) times daily.      aspirin EC 81 MG tablet Take 1 tablet (81 mg total) by mouth daily. 150 tablet 2   carboxymethylcellulose 1 % ophthalmic solution Apply 1 drop to eye 2 (two) times daily.     Cholecalciferol (VITAMIN D) 50 MCG (2000 UT) CAPS Take by mouth.     Cholecalciferol 50 MCG (2000 UT) CAPS SMARTSIG:1 Tablet(s) By Mouth Daily     clopidogrel (PLAVIX) 75 MG tablet Take 1 tablet (75 mg total) by mouth daily. 30 tablet 11   cyclobenzaprine (FLEXERIL) 10 MG tablet Take 10 mg by mouth at bedtime.     escitalopram (LEXAPRO) 5 MG tablet Take 5 mg by mouth daily.     gabapentin (NEURONTIN) 100 MG capsule Take 100 mg by mouth 3 (three) times daily.     mirtazapine (REMERON) 7.5 MG tablet Take 7.5 mg by mouth at bedtime.     Oxycodone HCl 10 MG TABS Take 10 mg by mouth every 6 (six) hours as needed for pain.     pantoprazole (PROTONIX) 40 MG tablet Take 40 mg by mouth daily.     RESTORE CALCIUM ALGINATE EX Apply topically. Note: Dressing located on left ankle. Facility cleaning wound "every day shift and applying wound cleanser and appication of calcium Alginate and cover with dry dressing and wrapped with Kerlix"     senna-docusate (SENOKOT-S) 8.6-50 MG tablet Take 1 tablet by mouth daily.     atorvastatin (LIPITOR) 10 MG tablet Take 1 tablet (10 mg total) by mouth daily. 30 tablet 11   No current facility-administered medications for this visit.      Marland Kitchen  PHYSICAL EXAMINATION: ECOG PERFORMANCE STATUS: 1 - Symptomatic but completely ambulatory  Vitals:   05/01/21 0846  BP: 108/82  Pulse: 73  Resp: 18  Temp: 98.3 F (36.8 C)  SpO2: 100%   Filed Weights   05/01/21 0846  Weight: 161 lb (73 kg)    Physical Exam HENT:     Head: Normocephalic and atraumatic.     Mouth/Throat:     Pharynx: No oropharyngeal exudate.  Eyes:     Pupils: Pupils are equal, round, and  reactive to light.  Cardiovascular:     Rate and Rhythm: Normal rate and regular rhythm.  Pulmonary:     Effort: No respiratory distress.  Breath sounds: No wheezing.  Abdominal:     General: Bowel sounds are normal. There is no distension.     Palpations: Abdomen is soft. There is no mass.     Tenderness: There is no abdominal tenderness. There is no guarding or rebound.  Musculoskeletal:        General: No tenderness. Normal range of motion.     Cervical back: Normal range of motion and neck supple.  Skin:    General: Skin is warm.     Comments: Bilateral lower extremity ulcerations noted.  Pulses intact bilaterally.  Cystlike lesion noted right elbow-nontender.  No erythema noted.  Neurological:     Mental Status: He is oriented to person, place, and time.     Comments: Sleepy but easily arousable.  Chronic weakness left upper and lower extremity.  Psychiatric:        Mood and Affect: Affect normal.     LABORATORY DATA:  I have reviewed the data as listed Lab Results  Component Value Date   WBC 7.3 05/22/2021   HGB 12.2 (L) 05/22/2021   HCT 39.4 05/22/2021   MCV 80.7 05/22/2021   PLT 214 05/22/2021   Recent Labs    04/10/21 0811 04/30/21 0940 05/01/21 0819 05/22/21 0835  NA 135  --  137 134*  K 4.3  --  4.1 4.0  CL 104  --  104 105  CO2 24  --  26 23  GLUCOSE 102*  --  84 82  BUN 20  --  21 22  CREATININE 0.90 1.00 0.87 0.87  CALCIUM 8.6*  --  8.5* 8.4*  GFRNONAA >60  --  >60 >60  PROT 8.1  --  7.7 7.6  ALBUMIN 3.7  --  3.7 3.8  AST 39  --  38 32  ALT 25  --  27 21  ALKPHOS 121  --  125 111  BILITOT 0.4  --  0.5 0.2*    RADIOGRAPHIC STUDIES: I have personally reviewed the radiological images as listed and agreed with the findings in the report. CT CHEST ABDOMEN PELVIS W CONTRAST  Result Date: 05/01/2021 CLINICAL DATA:  Stage IV ureteral cancer. History of stage III right colon cancer with recurrent sigmoid colon cancer EXAM: CT CHEST, ABDOMEN, AND  PELVIS WITH CONTRAST TECHNIQUE: Multidetector CT imaging of the chest, abdomen and pelvis was performed following the standard protocol during bolus administration of intravenous contrast. CONTRAST:  65m OMNIPAQUE IOHEXOL 350 MG/ML SOLN COMPARISON:  PET-CT 08/30/2020. Chest abdomen pelvis CT 08/01/2020. FINDINGS: CT CHEST FINDINGS Cardiovascular: The heart size is normal. No substantial pericardial effusion. Coronary artery calcification is evident. Right Port-A-Cath tip is positioned in the upper right atrium. Mediastinum/Nodes: No mediastinal lymphadenopathy. There is no hilar lymphadenopathy. The esophagus has normal imaging features. Prominent 8 mm short axis right axillary node seen to be hypermetabolic on previous PET-CT is stable in size. There is no left axillary lymphadenopathy. Lungs/Pleura: 13 mm lobular polypoid lesion noted posterior wall right mainstem bronchus, new in the interval. This does appear to have some trace gas in at laterally suggesting that it represents mucus/secretion. Calcified granuloma noted right upper lobe on 34/4. No new suspicious pulmonary nodule or mass. No focal airspace consolidation. No pleural effusion. Musculoskeletal: No worrisome lytic or sclerotic osseous abnormality. CT ABDOMEN PELVIS FINDINGS Hepatobiliary: No suspicious focal abnormality within the liver parenchyma. There is no evidence for gallstones, gallbladder wall thickening, or pericholecystic fluid. No intrahepatic or extrahepatic biliary dilation. Pancreas: No focal mass  lesion. No dilatation of the main duct. No intraparenchymal cyst. No peripancreatic edema. Spleen: No splenomegaly. No focal mass lesion. Adrenals/Urinary Tract: No adrenal nodule or mass. Similar appearance of right hydronephrosis with cortical thinning in the right kidney. The irregular/ill-defined wall thickening seen previously in the right renal pelvis and proximal ureter appears decreased in the interval with less enhancing tissue  visible on today's study, but this is a subtle change. Stable right double-J internal ureteral stent. The soft tissue fullness seen around the distal right ureter on the previous study is less evident today. Small cyst lower pole left kidney is similar to prior. Left ureter unremarkable. Mild bladder wall thickening may be related to underdistention. Stomach/Bowel: Stomach is unremarkable. No gastric wall thickening. No evidence of outlet obstruction. Duodenum is normally positioned as is the ligament of Treitz. No small bowel wall thickening. No small bowel dilatation. Status post right hemicolectomy previously identified wall thickening noted along the proximal sigmoid colon is not substantially changed (105/2). This region was noted to be hypermetabolic on previous PET-CT. Vascular/Lymphatic: There is moderate atherosclerotic calcification of the abdominal aorta without aneurysm. 10 mm short axis portal caval node on image 60/2 is stable since CT of 08/01/2020. Small lymph nodes seen along both pelvic sidewalls are decreased since prior CT. Index external iliac node measuring 6 mm on image 100/2 today was 9 mm previously (remeasured). 5 mm external iliac node on 108/2 today was 7 mm (remeasured) previously 5 mm short axis left external iliac node on 103/2 has decreased from 8 mm previously (remeasured). Reproductive: The prostate gland and seminal vesicles are unremarkable. Other: No intraperitoneal free fluid. Musculoskeletal: No worrisome lytic or sclerotic osseous abnormality. IMPRESSION: 1. Interval development of a 13 mm lobular polypoid lesion posterior wall right mainstem bronchus. This does appear to have some trace gas in it laterally suggesting that it represents mucus/secretion. Polypoid endobronchial lesion is considered much less likely but not excluded. Follow-up CT chest in 4-6 weeks could be used to re-evaluate. 2. The irregular/ill-defined wall thickening seen previously in the right renal pelvis  and proximal ureter appears decreased in the interval with less enhancing tissue visible on today's study, but this is a subtle change. 3. Stable right double-J internal ureteral stent with similar appearance of mild right hydronephrosis and cortical thinning in the right kidney. 4. Stable appearance of the wall thickening along the proximal sigmoid colon. This region was noted to be hypermetabolic on previous PET-CT. 5. Stable 10 mm short axis portal caval lymph node. Scattered upper normal lymph nodes seen in the pelvis bilaterally on the prior study have decreased in the interval. No pelvic lymphadenopathy by CT size criteria on today's study. 6. Aortic Atherosclerosis (ICD10-I70.0). Electronically Signed   By: Misty Stanley M.D.   On: 05/01/2021 07:57    ASSESSMENT & PLAN:   Ureteral cancer, right ([place]) # High-grade urothelial cancer/cytology; likely of the right renal pelvis /upper ureter. On keytruda-  CT scan- The irregular/ill-defined wall thickening seen previously in the right renal pelvis and proximal ureter appears decreased in the interval with less enhancing tissue visible on today's study, but this is a subtle change.Stable 10 mm short axis portal caval lymph node.  # Proceed with  [place] today; Labs today reviewed; Labs today reviewed;  acceptable for treatment today.  We will repeat imaging prior to next visit.  # Incidental on CT SEp 4th, 2022- 1. Interval development of a 13 mm lobular polypoid lesion right mainstem- mucus/secretion Vs true lesion- Follow-up  CT chest in 6 weeks.   #Descending colon mass- PET scan- FEB 2022-uptake in the cecum;  [history of Lynch syndrome]-colonoscopy 25 mm polyp; biopsy mucinous carcinoma- given risk vs benefits -hold surgery for now; re-eval with colonoscopy in Octobe. 4. Stable appearance of the wall thickening along the proximal sigmoid colon. This region was noted to be hypermetabolic on previous PET-CT.  # Iron deficiency anemia-hemoglobin  11.5; February 2022 ron studies/ferritin-LOW; hold Venofer with infusions- STABLE  # Bilateral LE ulcers-s/p wound care evaluation; s/p PTCA with Dr.Dew. bil Arterial Dopp-wnl- STABLE.   # Right elbow bursitis:s/p steroid injection.  No contraindications from oncology standpoint.  #DISPOSITION: #  Keytruda; venofer today # follow up in 3 weeks-;MD labs-cbc/cmp;  Keytruda; ;Dr.B.   All questions were answered. The patient knows to call the clinic with any problems, questions or concerns.    Cammie Sickle, MD 05/29/2021 10:11 PM

## 2021-05-01 NOTE — Assessment & Plan Note (Addendum)
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 On keytruda-  CT scan- The irregular/ill-defined wall thickening seen previously in the right renal pelvis and proximal ureter appears decreased in the interval with less enhancing tissue visible on today's study, but this is a subtle change.Stable 10 mm short axis portal caval lymph node.  # Proceed with  [place] today; Labs today reviewed; Labs today reviewed;  acceptable for treatment today.  We will repeat imaging prior to next visit.  # Incidental on CT SEp 4th, 2022- 1. Interval development of a 13 mm lobular polypoid lesion right mainstem- mucus/secretion Vs true lesion- Follow-up CT chest in 6 weeks.   #Descending colon mass- PET scan- FEB 2022-uptake in the cecum;  [history of Lynch syndrome]-colonoscopy 25 mm polyp; biopsy mucinous carcinoma- given risk vs benefits -hold surgery for now; re-eval with colonoscopy in Octobe. 4. Stable appearance of the wall thickening along the proximal sigmoid colon. This region was noted to be hypermetabolic on previous PET-CT.  # Iron deficiency anemia-hemoglobin 11.5; February 2022 ron studies/ferritin-LOW; hold Venofer with infusions- STABLE  # Bilateral LE ulcers-s/p wound care evaluation; s/p PTCA with Dr.Dew. bil Arterial Dopp-wnl- STABLE.   # Right elbow bursitis:s/p steroid injection.  No contraindications from oncology standpoint.  #DISPOSITION: #  Keytruda; venofer today # follow up in 3 weeks-;MD labs-cbc/cmp;  Keytruda; ;Dr.B.

## 2021-05-01 NOTE — Progress Notes (Signed)
Pt tolerated both venofer and keytruda infusions well today with no problems or concerns.  Pt left infusion suite stable in his wheelchair.

## 2021-05-01 NOTE — Patient Instructions (Signed)
[facility]  Discharge Instructions: Thank you for choosing [facility] to provide your oncology and hematology care.  If you have a lab appointment with the [facility], please go directly to the [facility] and check in at the registration area.  Wear comfortable clothing and clothing appropriate for easy access to any Portacath or PICC line.   We strive to give you quality time with your provider. You may need to reschedule your appointment if you arrive late (15 or more minutes).  Arriving late affects you and other patients whose appointments are after yours.  Also, if you miss three or more appointments without notifying the office, you may be dismissed from the clinic at the provider's discretion.      For prescription refill requests, have your pharmacy contact our office and allow 72 hours for refills to be completed.    Today you received the following chemotherapy and/or immunotherapy agents venofer, [name]      To help prevent nausea and vomiting after your treatment, we encourage you to take your nausea medication as directed.  BELOW ARE SYMPTOMS THAT SHOULD BE REPORTED IMMEDIATELY: *FEVER GREATER THAN 100.4 F (38 C) OR HIGHER *CHILLS OR SWEATING *NAUSEA AND VOMITING THAT IS NOT CONTROLLED WITH YOUR NAUSEA MEDICATION *UNUSUAL SHORTNESS OF BREATH *UNUSUAL BRUISING OR BLEEDING *URINARY PROBLEMS (pain or burning when urinating, or frequent urination) *BOWEL PROBLEMS (unusual diarrhea, constipation, pain near the anus) TENDERNESS IN MOUTH AND THROAT WITH OR WITHOUT PRESENCE OF ULCERS (sore throat, sores in mouth, or a toothache) UNUSUAL RASH, SWELLING OR PAIN  UNUSUAL VAGINAL DISCHARGE OR ITCHING   Items with * indicate a potential emergency and should be followed up as soon as possible or go to the Emergency Department if any problems should occur.  Please show the CHEMOTHERAPY ALERT CARD or IMMUNOTHERAPY ALERT CARD at  check-in to the Emergency Department and triage nurse.  Should you have questions after your visit or need to cancel or reschedule your appointment, please contact [facility]  [phone] and follow the prompts.  Office hours are 8:00 a.m. to 4:30 p.m. Monday - Friday. Please note that voicemails left after 4:00 p.m. may not be returned until the following business day.  We are closed weekends and major holidays. You have access to a nurse at all times for urgent questions. Please call the main number to the clinic [phone] and follow the prompts.  For any non-urgent questions, you may also contact your provider using MyChart. We now offer e-Visits for anyone 60 and older to request care online for non-urgent symptoms. For details visit [email or web address].   Also download the MyChart app! Go to the app store, search "MyChart", open the app, select [facility], and log in with your MyChart username and password.  Due to Covid, a mask is required upon entering the hospital/clinic. If you do not have a mask, one will be given to you upon arrival. For doctor visits, patients may have 1 support person aged 39 or older with them. For treatment visits, patients cannot have anyone with them due to current Covid guidelines and our immunocompromised population.   Pembrolizumab injection What is this medication? PEMBROLIZUMAB (pem broe liz ue mab) is a monoclonal antibody. It is used to treat certain types of cancer. This medicine may be used for other purposes; ask your health care provider or pharmacist if you have questions. COMMON BRAND NAME(S): Keytruda What should I tell my care  team before I take this medication? They need to know if you have any of these conditions: autoimmune diseases like Crohn's disease, ulcerative colitis, or lupus have had or planning to have an allogeneic stem cell transplant (uses someone else's stem cells) history of organ  transplant history of chest radiation nervous system problems like myasthenia gravis or Guillain-Barre syndrome an unusual or allergic reaction to pembrolizumab, other medicines, foods, dyes, or preservatives pregnant or trying to get pregnant breast-feeding How should I use this medication? This medicine is for infusion into a vein. It is given by a health care professional in a hospital or clinic setting. A special MedGuide will be given to you before each treatment. Be sure to read this information carefully each time. Talk to your pediatrician regarding the use of this medicine in children. While this drug may be prescribed for children as young as 6 months for selected conditions, precautions do apply. Overdosage: If you think you have taken too much of this medicine contact a poison control center or emergency room at once. NOTE: This medicine is only for you. Do not share this medicine with others. What if I miss a dose? It is important not to miss your dose. Call your doctor or health care professional if you are unable to keep an appointment. What may interact with this medication? Interactions have not been studied. This list may not describe all possible interactions. Give your health care provider a list of all the medicines, herbs, non-prescription drugs, or dietary supplements you use. Also tell them if you smoke, drink alcohol, or use illegal drugs. Some items may interact with your medicine. What should I watch for while using this medication? Your condition will be monitored carefully while you are receiving this medicine. You may need blood work done while you are taking this medicine. Do not become pregnant while taking this medicine or for 4 months after stopping it. Women should inform their doctor if they wish to become pregnant or think they might be pregnant. There is a potential for serious side effects to an unborn child. Talk to your health care professional or  pharmacist for more information. Do not breast-feed an infant while taking this medicine or for 4 months after the last dose. What side effects may I notice from receiving this medication? Side effects that you should report to your doctor or health care professional as soon as possible: allergic reactions like skin rash, itching or hives, swelling of the face, lips, or tongue bloody or black, tarry breathing problems changes in vision chest pain chills confusion constipation cough diarrhea dizziness or feeling faint or lightheaded fast or irregular heartbeat fever flushing joint pain low blood counts - this medicine may decrease the number of white blood cells, red blood cells and platelets. You may be at increased risk for infections and bleeding. muscle pain muscle weakness pain, tingling, numbness in the hands or feet persistent headache redness, blistering, peeling or loosening of the skin, including inside the mouth signs and symptoms of high blood sugar such as dizziness; dry mouth; dry skin; fruity breath; nausea; stomach pain; increased hunger or thirst; increased urination signs and symptoms of kidney injury like trouble passing urine or change in the amount of urine signs and symptoms of liver injury like dark urine, light-colored stools, loss of appetite, nausea, right upper belly pain, yellowing of the eyes or skin sweating swollen lymph nodes weight loss Side effects that usually do not require medical attention (report to your doctor   or health care professional if they continue or are bothersome): decreased appetite hair loss tiredness This list may not describe all possible side effects. Call your doctor for medical advice about side effects. You may report side effects to FDA at 1-800-FDA-1088. Where should I keep my medication? This drug is given in a hospital or clinic and will not be stored at home. NOTE: This sheet is a summary. It may not cover all possible  information. If you have questions about this medicine, talk to your doctor, pharmacist, or health care provider.  2022 Elsevier/Gold Standard (2019-06-15 21:44:53)  Iron Sucrose Injection What is this medication? IRON SUCROSE (EYE ern SOO krose) treats low levels of iron (iron deficiency anemia) in people with kidney disease. Iron is a mineral that plays an important role in making red blood cells, which carry oxygen from your lungs to the rest of your body. This medicine may be used for other purposes; ask your health care provider or pharmacist if you have questions. COMMON BRAND NAME(S): Venofer What should I tell my care team before I take this medication? They need to know if you have any of these conditions: Anemia not caused by low iron levels Heart disease High levels of iron in the blood Kidney disease Liver disease An unusual or allergic reaction to iron, other medications, foods, dyes, or preservatives Pregnant or trying to get pregnant Breast-feeding How should I use this medication? This medication is for infusion into a vein. It is given in a hospital or clinic setting. Talk to your care team about the use of this medication in children. While this medication may be prescribed for children as young as 2 years for selected conditions, precautions do apply. Overdosage: If you think you have taken too much of this medicine contact a poison control center or emergency room at once. NOTE: This medicine is only for you. Do not share this medicine with others. What if I miss a dose? It is important not to miss your dose. Call your care team if you are unable to keep an appointment. What may interact with this medication? Do not take this medication with any of the following: Deferoxamine Dimercaprol Other iron products This medication may also interact with the following: Chloramphenicol Deferasirox This list may not describe all possible interactions. Give your health care  provider a list of all the medicines, herbs, non-prescription drugs, or dietary supplements you use. Also tell them if you smoke, drink alcohol, or use illegal drugs. Some items may interact with your medicine. What should I watch for while using this medication? Visit your care team regularly. Tell your care team if your symptoms do not start to get better or if they get worse. You may need blood work done while you are taking this medication. You may need to follow a special diet. Talk to your care team. Foods that contain iron include: whole grains/cereals, dried fruits, beans, or peas, leafy green vegetables, and organ meats (liver, kidney). What side effects may I notice from receiving this medication? Side effects that you should report to your care team as soon as possible: Allergic reactions-skin rash, itching, hives, swelling of the face, lips, tongue, or throat Low blood pressure-dizziness, feeling faint or lightheaded, blurry vision Shortness of breath Side effects that usually do not require medical attention (report to your care team if they continue or are bothersome): Flushing Headache Joint pain Muscle pain Nausea Pain, redness, or irritation at injection site This list may not describe all  possible side effects. Call your doctor for medical advice about side effects. You may report side effects to FDA at 1-800-FDA-1088. Where should I keep my medication? This medication is given in a hospital or clinic and will not be stored at home. NOTE: This sheet is a summary. It may not cover all possible information. If you have questions about this medicine, talk to your doctor, pharmacist, or health care provider.  2022 Elsevier/Gold Standard (2020-10-09 12:52:06)

## 2021-05-03 ENCOUNTER — Encounter: Payer: Self-pay | Admitting: Nurse Practitioner

## 2021-05-03 ENCOUNTER — Other Ambulatory Visit: Payer: Self-pay

## 2021-05-03 ENCOUNTER — Non-Acute Institutional Stay: Payer: Medicaid Other | Admitting: Nurse Practitioner

## 2021-05-03 VITALS — BP 132/64 | HR 78 | Temp 97.0°F | Resp 18 | Wt 161.8 lb

## 2021-05-03 DIAGNOSIS — R630 Anorexia: Secondary | ICD-10-CM

## 2021-05-03 DIAGNOSIS — G894 Chronic pain syndrome: Secondary | ICD-10-CM

## 2021-05-03 DIAGNOSIS — Z515 Encounter for palliative care: Secondary | ICD-10-CM

## 2021-05-03 NOTE — Progress Notes (Signed)
[facility] Consult Note Telephone: [phone]  Fax: [phone]    Date of encounter: 05/03/21 3:23 PM PATIENT NAME: Chad [name] [id number]   [phone] (home)  DOB: Oct 14, 1960 MRN: [id number] PRIMARY CARE PROVIDER:    Dr [facility]  RESPONSIBLE PARTY:    Contact Information     Name Relation Home Work Mobile   Bradlee, Heitman   [phone]      I met face to face with patient in facility. Palliative Care was asked to follow this patient by consultation request of  Dr Nicole Kindred to address advance care planning and complex medical decision making. This is a follow up visit.                                  ASSESSMENT AND PLAN / RECOMMENDATIONS: Symptom Management/Plan: 1. ACP: DNR; treat what is treatable including hospitalizations if necessary   2.Anorexia secondary to protein calorie malnutrition related to cancer improving ongoing. Current weight 161.8 lbs. Continue to monitor daily weights, supplements, supportive measures and courage to eat   3. Chronic pain reviewed, discussed and educated continue to monitor on pain scale, monitor efficacy vs adverse side effects.Will continue to monitor; encouraged to ask when in pain, Continue to follow by psychotherapy and psychiatry for counseling; supportive services. Continue with Oxycodone 2m q6 prn   4. Palliative care encounter; Palliative medicine team will continue to support patient, patient's family, and medical team. Visit consisted of counseling and education dealing with the complex and emotionally intense issues of symptom management and palliative care in the setting of serious and potentially life-threatening illness  Follow up Palliative Care Visit: Palliative care will continue to follow for complex medical decision making, advance care planning, and clarification of goals. Return 8 weeks or prn.  I  spent 38 minutes providing this consultation starting at 11:45am. More than 50% of the time in this consultation was spent in counseling and care coordination. PPS: 50%  Chief Complaint: Follow up palliative consult for complex medical decision making  HISTORY OF PRESENT ILLNESS:  Chad [name] a 60y.o. year old male  with multiple medical problems including   late onset CVA, urothelial cancer, prostate cancer, chronic hepatitis C, hyperlipidemia, colon surgery, protein calorie malnutrition, anxiety, depression. [name] [name] himself to w/c, requires assistance for ADL's. Chad [name] himself with fair to declined appetite. Mr. LCalicocontinues to smoke daily. Followed by Psychiatry 12/27/2020 for recurrent depressive episode with h/o MDD prescribed escitalopram. No changes at this visit. 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 No recent [name], infections, hospitalization. Saw Dr BRogue Bussing10/11/2020 for High-grade urothelial cancer/cytology; likely of the right renal pelvis /upper ureter, proceeded with keytruda infusion. IDA stable. Bilateral LE ulcers s/p PTCA seen at wound care today. Staff endorses no other changes or concerns, pain appears controlled. I visited and observed Chad [name] talked about purpose of PC visit, Chad [name] agreement. [name] talked about how he is feeling. Mr. LBelvedereendorses he is doing better, returned from wound clinic today with improvements. [name] talked about symptoms of pain, reviewed with current regimen. No sob. Chad [name]  continues to smoke daily, propel in w/c at the facility. Appetite is fair per Chad [name]. [name] talked about cancer, Keytruda. [name] talked  about facility, concerns with coping strategies. Chad [name] endorses he likes his new room. Chad [name] endorses he continues to have wound care with improvements of BLE wounds. [name] talked about smoking cessation which he declined. [name] talked about medical goals, quality of life. [name] talked about role pc in poc. Chad [name] expressed he was thankful for [facility] visit today. Therapeutic listening, emotional support provided. I updated staff, no changes to poc  History obtained from review of EMR, discussion with facility staff and Chad [name].  I reviewed available labs, medications, imaging, studies and related documents from the EMR.  Records reviewed and summarized above.   ROS Full 10 system review of systems performed and negative with exception of: as per HPI.   Physical Exam: Constitutional: NAD General: debilitated, pleasant male EYES:  lids intact ENMT: oral mucous membranes moist CV: S1S2, RRR, +BLE edema Pulmonary: LCTA, no increased work of breathing, +chronic cough Abdomen: intake 100%, normo-active BS + 4 quadrants, soft and non tender, no ascites GU: deferred MSK: w/c dependent Skin: warm and dry Neuro:  + generalized weakness,  no cognitive impairment Psych: non-anxious affect, A and O x 3 Questions and concerns were addressed.  Provided general support and encouragement, no other unmet needs identified   Thank you for the opportunity to participate in the care of [name].  The palliative care team will continue to follow. Please call our office at [phone] if [name] can be of additional assistance.   This chart was dictated using voice recognition software.  Despite best efforts to proofread,  errors can occur which can change the documentation meaning.   Riot Barrick Ihor Gully, NP

## 2021-05-06 ENCOUNTER — Encounter: Payer: Medicaid Other | Attending: Internal Medicine | Admitting: Internal Medicine

## 2021-05-06 ENCOUNTER — Other Ambulatory Visit: Payer: Self-pay

## 2021-05-06 DIAGNOSIS — I872 Venous insufficiency (chronic) (peripheral): Secondary | ICD-10-CM | POA: Insufficient documentation

## 2021-05-06 DIAGNOSIS — L97322 Non-pressure chronic ulcer of left ankle with fat layer exposed: Secondary | ICD-10-CM | POA: Diagnosis not present

## 2021-05-06 DIAGNOSIS — I69354 Hemiplegia and hemiparesis following cerebral infarction affecting left non-dominant side: Secondary | ICD-10-CM | POA: Insufficient documentation

## 2021-05-06 DIAGNOSIS — B1920 Unspecified viral hepatitis C without hepatic coma: Secondary | ICD-10-CM | POA: Insufficient documentation

## 2021-05-06 DIAGNOSIS — L97512 Non-pressure chronic ulcer of other part of right foot with fat layer exposed: Secondary | ICD-10-CM | POA: Diagnosis not present

## 2021-05-06 DIAGNOSIS — J449 Chronic obstructive pulmonary disease, unspecified: Secondary | ICD-10-CM | POA: Insufficient documentation

## 2021-05-06 DIAGNOSIS — L97412 Non-pressure chronic ulcer of right heel and midfoot with fat layer exposed: Secondary | ICD-10-CM | POA: Insufficient documentation

## 2021-05-06 DIAGNOSIS — Z5900 Homelessness unspecified: Secondary | ICD-10-CM | POA: Diagnosis not present

## 2021-05-06 NOTE — Progress Notes (Signed)
Toomsuba, ANTOLIN ([id number]) Visit Report for 05/06/2021 Debridement Details Patient Name: Chad [name] Date of Service: 05/06/2021 9:00 AM Medical Record Number: [id number] Patient Account Number: [id number] Date of Birth/Sex: 1961-01-21 (60 y.o. M) Treating RN: Dolan Amen Primary Care Provider: SYSTEM, PCP Other Clinician: Referring Provider: Clayborn Bigness Treating Provider/Extender: Tito Dine in Treatment: 37 Debridement Performed for Wound #8 Left,Medial Ankle Assessment: Performed By: Physician Ricard Dillon, MD Debridement Type: Debridement Severity of Tissue Pre Debridement: Fat layer exposed Level of Consciousness (Pre- Awake and Alert procedure): Pre-procedure Verification/Time Out Yes - 09:08 Taken: Start Time: 09:08 Total Area Debrided (L x W): 1.8 (cm) x 1.2 (cm) = 2.16 (cm) Tissue and other material Viable, Non-Viable, Slough, Subcutaneous, Biofilm, Slough debrided: Level: Skin/Subcutaneous Tissue Debridement Description: Excisional Instrument: Curette Bleeding: Moderate Hemostasis Achieved: Silver Nitrate Response to Treatment: Procedure was tolerated well Level of Consciousness (Post- Awake and Alert procedure): Post Debridement Measurements of Total Wound Length: (cm) 1.8 Width: (cm) 1.2 Depth: (cm) 0.2 Volume: (cm) 0.339 Character of Wound/Ulcer Post Debridement: Stable Severity of Tissue Post Debridement: Fat layer exposed Post Procedure Diagnosis Same as Pre-procedure Notes 1 silver nitrate stick used Electronic Signature(s) Signed: 05/06/2021 5:03:00 PM By: Dolan Amen RN Signed: 05/06/2021 5:14:01 PM By: Linton Ham MD Entered By: Dolan Amen on 05/06/2021 09:17:11 Chad [name] ([id number]) -------------------------------------------------------------------------------- HPI Details Patient Name: Chad [name] Date of Service: 05/06/2021 9:00 AM Medical Record Number: [id number] Patient Account  Number: [id number] Date of Birth/Sex: 1961-05-06 (60 y.o. M) Treating RN: Dolan Amen Primary Care Provider: SYSTEM, PCP Other Clinician: Referring Provider: Clayborn Bigness Treating Provider/Extender: Tito Dine in Treatment: 11 History of Present Illness HPI Description: 10/08/18 on evaluation today patient actually presents to our office for initial evaluation concerning wounds that he has of the bilateral lower extremities. 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 He also has bilateral venous stasis. He apparently has been homeless currently following discharge from the hospital apparently he has been placed at [facility] which is is a skilled nursing facility locally. Nonetheless fortunately he does not show any signs of infection at this time which is good news. In fact several of the wound actually appears to be showing some signs of improvement already in my pinion. There are a couple areas in the left leg in particular there likely gonna require some sharp debridement to help clear away some necrotic tissue and help with more sufficient healing. No fevers, chills, nausea, or vomiting noted at this time. 10/15/18 on evaluation today patient actually appears to be doing very well in regard to his bilateral lower extremities. He's been tolerating the dressing changes without complication. Fortunately there does not appear to be any evidence of active infection at this time which is great news. Overall I'm actually very pleased with how this has progressed in just one visits time. Readmission: 08/14/2020 upon evaluation today patient presents for re-evaluation here in our clinic. He is having issues with his left ankle region as well as his right toe and his right heel. He tells me that the toe and heel actually began as  a area that was itching that he was scratching and then subsequently opened up into wounds. These may have been abscess areas I presume based on what I am seeing currently. With regard to his left ankle region he tells me this was  a similar type occurrence although he does have venous stasis this very well may be more of a venous leg ulcer more than anything. Nonetheless I do believe that the patient would benefit from appropriate and aggressive wound care to try to help get things under better control here. He does have history of a stroke on the left side affecting him to some degree there that he is able to stand although he does have some residual weakness. Otherwise again the patient does have chronic venous insufficiency as previously noted. His arterial studies most recently obtained showed that he had an ABI on the right of 1.16 with a TBI of 0.52 and on the left and ABI of 1.14 with a TBI of 0.81. That was obtained on 06/19/2020. 08/28/2020 upon evaluation today patient appears to be doing decently well in regard to his wounds in general. He has been tolerating the dressing changes without complication. Fortunately there does not appear to be any signs of active infection which is great news. With that being said I think the [facility] is doing a good job I would recommend that we likely continue with that currently. 09/11/2020 upon evaluation today patient's wounds did not appear to be doing too poorly but again he is not really showing signs of significant improvement with regard to any of the wounds on the right. None of them have Hydrofera Blue on them I am not exactly sure why this is not being followed as the facility did not contact us to let us know of any issues with obtaining dressings or otherwise. With that being said he is supposed to be using Hydrofera Blue on both of the wounds on the right foot as well as the ankle wound on the left side. 09/18/2020 upon evaluation today  patient appears to be doing poorly with regard to his wounds. Again right now the left ankle in particular showed signs of extreme maceration. Apparently he was told by someone with staff at [facility] they could not get the [facility]. With that being said this is something that is never been relayed to [place] one way or another. Also the patient subsequently has not supposed to have a border gauze dressing on. He should have an ABD pad and roll gauze to secure as this drains much too much just to have a border gauze dressing to cover. Nonetheless the fact that they are not using the appropriate dressing is directly causing deterioration of the left ankle wound it is significantly worse today compared to what it was previous. I did attempt to call [facility] while the patient was here I called three times and got no one to even pick up the phone. After this I had my for an office coordinator call and she was able to finally get through and leave a message with the D ON as of dictation of this note which is roughly about an hour and a half later I still have not been able to speak with anyone at the facility. 09/25/2020 upon evaluation today patient actually showing signs of good improvement which is excellent news. He has been tolerating the dressing changes without complication. Fortunately there is no signs of active infection which is great news. No fevers, chills, nausea, vomiting, or diarrhea. I do feel like the facility has been doing a much better job at taking care of him as far as the dressings are concerned. However the director of nursing never did call me back. 10/09/2020 upon evaluation  today patient appears to be doing well with regard to his wound. The toe ulcer did require some debridement but the other 2 areas actually appear to be doing quite well. 10/19/2020 upon evaluation today patient actually appears to be doing very well in regard to his wounds. In fact the heel  does appear to be completely healed. The toe is doing better in the medial ankle on the left is also doing better. Overall I think he is headed in the right direction. 10/26/2020 upon evaluation today patient appears to be doing well with regard to his wound. He is showing signs of improvement which is great news and overall I am very pleased with where things stand today. No fevers, chills, nausea, vomiting, or diarrhea. 11/02/2020 upon evaluation today patient appears to be doing well with regard to his wounds. He has been tolerating the dressing changes without complication overall I am extremely pleased with where things stand today. He in regard to the toe is almost completely healed and the medial ankle on the left is doing much better. 11/09/2020 upon evaluation today patient appears to be doing a little poorly in regard to his left medial ankle ulcer. Fortunately there does not appear to be any signs of systemic infection but unfortunately locally he does appear to be infected in fact he has blue-green drainage consistent with Pseudomonas. YOUCEF, KLAS ([id number]) 11/16/2020 upon evaluation today patient appears to be doing well with regard to his wound. It actually appears to be doing better. I did place him on gentamicin cream since the Cipro was actually resistant even though he was positive for Pseudomonas on culture. Overall I think that he does seem to be doing better though I am unsure whether or not they have actually been putting the cream on. The patient is not sure that we did talk to the nurse directly and she was going to initiate that treatment. Fortunately there does not appear to be any signs of active infection at this time. No fevers, chills, nausea, vomiting, or diarrhea. 4/28; the area on the right second toe is close to healed. Left medial ankle required debridement 12/07/2020 upon evaluation today patient appears to be doing well with regard to his wounds. In fact the  right second toe appears to be completely healed which is great news. Fortunately there does not appear to be any signs of active infection at this time which is also great news. I think we can probably discontinue the gentamicin on top of everything else. 12/14/2020 upon evaluation today patient appears to be doing well with regard to his wound. He is making good progress and overall very pleased with where things stand today. There is no signs of active infection at this time which is great news. 12/28/2020 upon evaluation today patient appears to be doing well with regard to his wounds. He has been tolerating the dressing changes without complication. Fortunately there is no signs of active infection at this time. No fevers, chills, nausea, vomiting, or diarrhea. 12/28/2020 upon evaluation today patient's wound bed actually showed signs of excellent improvement. He has great epithelization and granulation I do not see any signs of infection overall I am extremely pleased with where things stand at this point. No fevers, chills, nausea, vomiting, or diarrhea. 01/11/2021 upon evaluation today patient appears to be doing well with regard to his wound on his leg. He has been tolerating the dressing changes without complication. Fortunately there does not appear to be any signs of active infection  which is great news. No fevers, chills, nausea, vomiting, or diarrhea. 01/25/2021 upon evaluation today patient appears to be doing well with regard to his wound. He has been tolerating the dressing changes without complication. Fortunately the collagen seems to be doing a great job which is excellent news. No fevers, chills, nausea, vomiting, or diarrhea. 02/08/2021 upon evaluation today patient's wound is actually looking a little bit worse especially in the periwound compared to previous. Fortunately there does not appear to be any signs of infection which is great news with that being said he does have some  irritation around the periphery of the wound which has me more concerned. He actually had a dressing on that had not been changed in 3 days. He also is supposed to have daily dressing changes. With regard to the dressing applied he had a silver alginate dressing and silver collagen is what is recommended and ordered. He also had no Desitin around the edges of the wound in the periwound region although that is on the order inspect to be done as well. In general I was very concerned I did contact [facility] actually spoke with Magda Paganini who is the scheduling individual and subsequently she stated that she would pass the information to the D ON apparently the D ON was not available to talk to me when I call today. 02/18/2021 upon evaluation today patient's wound is actually showing signs of improvement. Fortunately there does not appear to be any evidence of infection which is great news overall I am extremely pleased with where things stand today. No fevers, chills, nausea, vomiting, or diarrhea. 8/3; patient presents for 1 week follow-up. He has no issues or complaints today. He denies signs of infection. 03/11/2021 upon evaluation today patient appears to be doing well with regard to his wound. He does have a little bit of slough noted on the surface of the wound but fortunately there does not appear to be any signs of active infection at this time. No fevers, chills, nausea, vomiting, or diarrhea. 03/18/2021 upon evaluation today patient appears to be doing well with regard to his wound. He has been tolerating the dressing changes without complication. There was a little irritation more proximal to where the wound was that was not noted last week but nonetheless this is very superficial just seems to be more irritation we just need to make sure to put a good amount of the zinc over the area in my opinion. Otherwise he does not seem to be doing significantly worse at all which is great  news. 03/25/2021 upon evaluation today patient appears to be doing well with regard to his wound. He is going require some sharp debridement today to clear with some of the necrotic debris. I did perform this today without complication postdebridement wound bed appears to be doing much better this is great news. 04/08/2021 upon evaluation today patient appears to be doing decently well in regard to his wound although the overall measurement is not significantly smaller compared to previous. It is gone down a little bit but still the facility continues to not really put the appropriate dressings in place in fact he was supposed to have collagen we think he probably had more of an allergy to At this point. Fortunately there does not appear to be any signs of active infection systemically though locally I do not see anything on initial visualization either as far as erythema or warmth. 04/15/2021 upon evaluation today patient appears to be doing well with  regard to his wound. He is actually showing signs of improvement. I did place him on antibiotics last week, Cipro. He has been taking that 2 times a day and seems to be tolerating it very well. I do not see any evidence of worsening and in fact the overall appearance of the wound is smaller today which is also great news. 9/26; left medial ankle chronic venous insufficiency wound is improved. Using Hydrofera Blue 10/10; left medial ankle chronic venous insufficiency. Wound has not changed much in appearance completely nonviable surface. Apparently there have been problems getting the right product on the wound at the facility although he came in with [facility] on today Electronic Signature(s) Signed: 05/06/2021 5:14:01 PM By: Linton Ham MD Entered By: Linton Ham on 05/06/2021 09:15:12 Chad [name] ([id number]) -------------------------------------------------------------------------------- Physical Exam Details Patient Name:  Chad [name] Date of Service: 05/06/2021 9:00 AM Medical Record Number: [id number] Patient Account Number: [id number] Date of Birth/Sex: 1961/06/21 (60 y.o. M) Treating RN: Dolan Amen Primary Care Provider: SYSTEM, PCP Other Clinician: Referring Provider: Clayborn Bigness Treating Provider/Extender: Tito Dine in Treatment: 37 Constitutional Sitting or standing Blood Pressure is within target range for patient.. Pulse regular and within target range for patient.Marland Kitchen Respirations regular, non- labored and within target range.. Temperature is normal and within the target range for the patient.Marland Kitchen appears in no distress. Notes Wound exam; completely nonviable surface. Quite a changes from what I saw 2 weeks ago. Using a #5 curette a reasonably aggressive debridement. Surface slough removed followed by a very fibrinous gritty surface. Hemostasis with a pressure dressing. Electronic Signature(s) Signed: 05/06/2021 5:14:01 PM By: Linton Ham MD Entered By: Linton Ham on 05/06/2021 09:16:11 Chad [name] ([id number]) -------------------------------------------------------------------------------- Physician Orders Details Patient Name: Chad [name] Date of Service: 05/06/2021 9:00 AM Medical Record Number: [id number] Patient Account Number: [id number] Date of Birth/Sex: 1960-09-07 (61 y.o. M) Treating RN: Dolan Amen Primary Care Provider: SYSTEM, PCP Other Clinician: Referring Provider: Clayborn Bigness Treating Provider/Extender: Tito Dine in Treatment: 19 Verbal / Phone Orders: No Diagnosis Coding Follow-up Appointments o Return Appointment in 1 week. Bathing/ Shower/ Hygiene o May shower; gently cleanse wound with antibacterial soap, rinse and pat dry prior to dressing wounds Edema Control - Lymphedema / Segmental Compressive Device / Other o Elevate legs to the level of the heart and pump ankles as often as possible o Elevate leg(s) parallel  to the floor when sitting. o DO YOUR BEST to sleep in the bed at night. DO NOT sleep in your recliner. Long hours of sitting in a recliner leads to swelling of the legs and/or potential wounds on your backside. Wound Treatment Wound #8 - Ankle Wound Laterality: Left, Medial Cleanser: Normal Saline 3 x Per Week/30 Days Discharge Instructions: Wash your hands with soap and water. Remove old dressing, discard into plastic bag and place into trash. Cleanse the wound with Normal Saline prior to applying a clean dressing using gauze sponges, not tissues or cotton balls. Do not scrub or use excessive force. Pat dry using gauze sponges, not tissue or cotton balls. Peri-Wound Care: Desitin Maximum Strength Ointment 4 (oz) 3 x Per Week/30 Days Discharge Instructions: Apply zinc periwound Primary Dressing: Hydrofera Blue Ready Transfer Foam, 2.5x2.5 (in/in) 3 x Per Week/30 Days Discharge Instructions: Apply Hydrofera Blue Ready to wound bed as directed (EXTRA SENT WITH PATIENT, PLEASE USE) Secondary Dressing: Kerlix 4.5 x 4.1 (in/yd) 3 x Per Week/30 Days Discharge Instructions: Apply kerlix over ABD pad Secured With: 29M Medipore H  Soft Cloth Surgical Tape, 2x2 (in/yd) 3 x Per Week/30 Days Discharge Instructions: Secure kerlix Electronic Signature(s) Signed: 05/06/2021 5:03:00 PM By: Dolan Amen RN Signed: 05/06/2021 5:14:01 PM By: Linton Ham MD Entered By: Dolan Amen on 05/06/2021 09:10:56 Chad [name] ([id number]) -------------------------------------------------------------------------------- Problem List Details Patient Name: Chad [name] Date of Service: 05/06/2021 9:00 AM Medical Record Number: [id number] Patient Account Number: [id number] Date of Birth/Sex: [birth year] (60 y.o. 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 08/14/2020 No Yes non-dominant side Inactive Problems ICD-10 Code Description Active Date Inactive Date L97.412 Non-pressure chronic ulcer of right heel and midfoot with fat layer exposed 08/14/2020 08/14/2020 L97.512 Non-pressure chronic ulcer of other part of right foot with fat layer exposed 08/14/2020 08/14/2020 Resolved Problems Electronic Signature(s) Signed: 05/06/2021 5:14:01 PM By: Linton Ham MD Entered By: Linton Ham on 05/06/2021 09:14:13 Chad [name] ([id number]) -------------------------------------------------------------------------------- Progress Note Details Patient Name: Chad [name] Date of Service: 05/06/2021 9:00 AM Medical Record Number: [id number] Patient Account Number: [id number] Date of Birth/Sex: 1961/02/09 (60 y.o. M) Treating RN: Dolan Amen Primary Care Provider: SYSTEM, PCP Other Clinician: Referring Provider: Clayborn Bigness Treating Provider/Extender: Tito Dine in Treatment: 37 Subjective History of Present Illness (HPI) 10/08/18 on evaluation today patient actually presents to our office for initial evaluation concerning wounds that he has of the bilateral lower extremities. 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 He also has bilateral venous stasis. He apparently has been homeless currently following discharge from the hospital apparently he has been placed at [facility] which is is a skilled nursing facility locally. Nonetheless fortunately he does not show  any signs of infection at this time which is good news. In fact several of the wound actually appears to be showing some signs of improvement already in my pinion. There are a couple areas in the left leg in particular there likely gonna require some sharp debridement to help clear away some necrotic tissue and help with more sufficient healing. No fevers, chills, nausea, or vomiting noted at this time. 10/15/18 on evaluation today patient actually appears to be doing very well in regard to his bilateral lower extremities. He's been tolerating the dressing changes without complication. Fortunately there does not appear to be any evidence of active infection at this time which is great news. Overall I'm actually very pleased with how this has progressed in just one visits time. Readmission: 08/14/2020 upon evaluation today patient presents for re-evaluation here in our clinic. He is having issues with his left ankle region as well as his right toe and his right heel. He tells me that the toe and heel actually began as a area that was itching that he was scratching and then subsequently opened up into wounds. These may have been abscess areas I presume based on what I am seeing currently. With regard to his left ankle region he tells me this was a similar type occurrence although he does have venous stasis this very well may be more of a venous leg ulcer more than  anything. Nonetheless I do believe that the patient would benefit from appropriate and aggressive wound care to try to help get things under better control here. He does have history of a stroke on the left side affecting him to some degree there that he is able to stand although he does have some residual weakness. Otherwise again the patient does have chronic venous insufficiency as previously noted. His arterial studies most recently obtained showed that he had an ABI on the right of 1.16 with a TBI of 0.52 and on the left and ABI of 1.14  with a TBI of 0.81. That was obtained on 06/19/2020. 08/28/2020 upon evaluation today patient appears to be doing decently well in regard to his wounds in general. He has been tolerating the dressing changes without complication. Fortunately there does not appear to be any signs of active infection which is great news. With that being said I think the [facility] is doing a good job I would recommend that we likely continue with that currently. 09/11/2020 upon evaluation today patient's wounds did not appear to be doing too poorly but again he is not really showing signs of significant improvement with regard to any of the wounds on the right. None of them have Hydrofera Blue on them I am not exactly sure why this is not being followed as the facility did not contact us to let us know of any issues with obtaining dressings or otherwise. With that being said he is supposed to be using Hydrofera Blue on both of the wounds on the right foot as well as the ankle wound on the left side. 09/18/2020 upon evaluation today patient appears to be doing poorly with regard to his wounds. Again right now the left ankle in particular showed signs of extreme maceration. Apparently he was told by someone with staff at [facility] they could not get the [facility]. With that being said this is something that is never been relayed to [place] one way or another. Also the patient subsequently has not supposed to have a border gauze dressing on. He should have an ABD pad and roll gauze to secure as this drains much too much just to have a border gauze dressing to cover. Nonetheless the fact that they are not using the appropriate dressing is directly causing deterioration of the left ankle wound it is significantly worse today compared to what it was previous. I did attempt to call [facility] while the patient was here I called three times and got no one to even pick up the phone. After this I had my for an  office coordinator call and she was able to finally get through and leave a message with the D ON as of dictation of this note which is roughly about an hour and a half later I still have not been able to speak with anyone at the facility. 09/25/2020 upon evaluation today patient actually showing signs of good improvement which is excellent news. He has been tolerating the dressing changes without complication. Fortunately there is no signs of active infection which is great news. No fevers, chills, nausea, vomiting, or diarrhea. I do feel like the facility has been doing a much better job at taking care of him as far as the dressings are concerned. However the director of nursing never did call me back. 10/09/2020 upon evaluation today patient appears to be doing well with regard to his wound. The toe ulcer did require some debridement but the other  2 areas actually appear to be doing quite well. 10/19/2020 upon evaluation today patient actually appears to be doing very well in regard to his wounds. In fact the heel does appear to be completely healed. The toe is doing better in the medial ankle on the left is also doing better. Overall I think he is headed in the right direction. 10/26/2020 upon evaluation today patient appears to be doing well with regard to his wound. He is showing signs of improvement which is great news and overall I am very pleased with where things stand today. No fevers, chills, nausea, vomiting, or diarrhea. 11/02/2020 upon evaluation today patient appears to be doing well with regard to his wounds. He has been tolerating the dressing changes without complication overall I am extremely pleased with where things stand today. He in regard to the toe is almost completely healed and the medial ankle on the left is doing much better. 11/09/2020 upon evaluation today patient appears to be doing a little poorly in regard to his left medial ankle ulcer. Fortunately there does not appear to  be any signs of systemic infection but unfortunately locally he does appear to be infected in fact he has blue-green drainage consistent with Pseudomonas. NANCY, MANUELE ([id number]) 11/16/2020 upon evaluation today patient appears to be doing well with regard to his wound. It actually appears to be doing better. I did place him on gentamicin cream since the Cipro was actually resistant even though he was positive for Pseudomonas on culture. Overall I think that he does seem to be doing better though I am unsure whether or not they have actually been putting the cream on. The patient is not sure that we did talk to the nurse directly and she was going to initiate that treatment. Fortunately there does not appear to be any signs of active infection at this time. No fevers, chills, nausea, vomiting, or diarrhea. 4/28; the area on the right second toe is close to healed. Left medial ankle required debridement 12/07/2020 upon evaluation today patient appears to be doing well with regard to his wounds. In fact the right second toe appears to be completely healed which is great news. Fortunately there does not appear to be any signs of active infection at this time which is also great news. I think we can probably discontinue the gentamicin on top of everything else. 12/14/2020 upon evaluation today patient appears to be doing well with regard to his wound. He is making good progress and overall very pleased with where things stand today. There is no signs of active infection at this time which is great news. 12/28/2020 upon evaluation today patient appears to be doing well with regard to his wounds. He has been tolerating the dressing changes without complication. Fortunately there is no signs of active infection at this time. No fevers, chills, nausea, vomiting, or diarrhea. 12/28/2020 upon evaluation today patient's wound bed actually showed signs of excellent improvement. He has great epithelization and  granulation I do not see any signs of infection overall I am extremely pleased with where things stand at this point. No fevers, chills, nausea, vomiting, or diarrhea. 01/11/2021 upon evaluation today patient appears to be doing well with regard to his wound on his leg. He has been tolerating the dressing changes without complication. Fortunately there does not appear to be any signs of active infection which is great news. No fevers, chills, nausea, vomiting, or diarrhea. 01/25/2021 upon evaluation today patient appears to be doing well with  regard to his wound. He has been tolerating the dressing changes without complication. Fortunately the collagen seems to be doing a great job which is excellent news. No fevers, chills, nausea, vomiting, or diarrhea. 02/08/2021 upon evaluation today patient's wound is actually looking a little bit worse especially in the periwound compared to previous. Fortunately there does not appear to be any signs of infection which is great news with that being said he does have some irritation around the periphery of the wound which has me more concerned. He actually had a dressing on that had not been changed in 3 days. He also is supposed to have daily dressing changes. With regard to the dressing applied he had a silver alginate dressing and silver collagen is what is recommended and ordered. He also had no Desitin around the edges of the wound in the periwound region although that is on the order inspect to be done as well. In general I was very concerned I did contact [facility] actually spoke with Magda Paganini who is the scheduling individual and subsequently she stated that she would pass the information to the D ON apparently the D ON was not available to talk to me when I call today. 02/18/2021 upon evaluation today patient's wound is actually showing signs of improvement. Fortunately there does not appear to be any evidence of infection which is great news overall  I am extremely pleased with where things stand today. No fevers, chills, nausea, vomiting, or diarrhea. 8/3; patient presents for 1 week follow-up. He has no issues or complaints today. He denies signs of infection. 03/11/2021 upon evaluation today patient appears to be doing well with regard to his wound. He does have a little bit of slough noted on the surface of the wound but fortunately there does not appear to be any signs of active infection at this time. No fevers, chills, nausea, vomiting, or diarrhea. 03/18/2021 upon evaluation today patient appears to be doing well with regard to his wound. He has been tolerating the dressing changes without complication. There was a little irritation more proximal to where the wound was that was not noted last week but nonetheless this is very superficial just seems to be more irritation we just need to make sure to put a good amount of the zinc over the area in my opinion. Otherwise he does not seem to be doing significantly worse at all which is great news. 03/25/2021 upon evaluation today patient appears to be doing well with regard to his wound. He is going require some sharp debridement today to clear with some of the necrotic debris. I did perform this today without complication postdebridement wound bed appears to be doing much better this is great news. 04/08/2021 upon evaluation today patient appears to be doing decently well in regard to his wound although the overall measurement is not significantly smaller compared to previous. It is gone down a little bit but still the facility continues to not really put the appropriate dressings in place in fact he was supposed to have collagen we think he probably had more of an allergy to At this point. Fortunately there does not appear to be any signs of active infection systemically though locally I do not see anything on initial visualization either as far as erythema or warmth. 04/15/2021 upon evaluation  today patient appears to be doing well with regard to his wound. He is actually showing signs of improvement. I did place him on antibiotics last week, Cipro. He has  been taking that 2 times a day and seems to be tolerating it very well. I do not see any evidence of worsening and in fact the overall appearance of the wound is smaller today which is also great news. 9/26; left medial ankle chronic venous insufficiency wound is improved. Using Hydrofera Blue 10/10; left medial ankle chronic venous insufficiency. Wound has not changed much in appearance completely nonviable surface. Apparently there have been problems getting the right product on the wound at the facility although he came in with [facility] on today Objective Snellings, Yordin ([id number]) Constitutional Sitting or standing Blood Pressure is within target range for patient.. Pulse regular and within target range for patient.Marland Kitchen Respirations regular, non- labored and within target range.. Temperature is normal and within the target range for the patient.Marland Kitchen appears in no distress. Vitals Time Taken: 8:54 AM, Height: 67 in, Weight: 160 lbs, BMI: 25.1, Temperature: 97.8 F, Pulse: 76 bpm, Respiratory Rate: 18 breaths/min, Blood Pressure: 118/84 mmHg. General Notes: Wound exam; completely nonviable surface. Quite a changes from what I saw 2 weeks ago. Using a #5 curette a reasonably aggressive debridement. Surface slough removed followed by a very fibrinous gritty surface. Hemostasis with a pressure dressing. Integumentary (Hair, Skin) Wound #8 status is Open. Original cause of wound was Gradually Appeared. The date acquired was: 07/12/2019. The wound has been in treatment 37 weeks. The wound is located on the Left,Medial Ankle. The wound measures 1.8cm length x 1.2cm width x 0.1cm depth; 1.696cm^2 area and 0.17cm^3 volume. There is Fat Layer (Subcutaneous Tissue) exposed. There is no tunneling or undermining noted. There is a  medium amount of serosanguineous drainage noted. The wound margin is flat and intact. There is large (67-100%) red, pink granulation within the wound bed. There is a small (1-33%) amount of necrotic tissue within the wound bed including Adherent Slough. 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 There was a Excisional Skin/Subcutaneous Tissue Debridement with a total area of 2.16 sq cm performed by Ricard Dillon, MD. With the following instrument(s): Curette to remove Viable and Non-Viable tissue/material. Material removed includes Subcutaneous Tissue, Slough, and Biofilm. A time out was conducted at 09:08, prior to the start of the procedure. A Minimum amount of bleeding was controlled with Pressure. The procedure was tolerated well. Post Debridement Measurements: 1.8cm length x 1.2cm width x 0.2cm depth; 0.339cm^3 volume. Character of Wound/Ulcer Post Debridement is stable. Severity of Tissue Post Debridement is: Fat layer exposed. Post procedure Diagnosis Wound #8: Same as Pre-Procedure Plan Follow-up Appointments: Return Appointment in 1 week. Bathing/ Shower/ Hygiene: May shower; gently cleanse wound with antibacterial soap, rinse and pat dry prior to dressing wounds Edema Control - Lymphedema / Segmental Compressive Device / Other: Elevate legs to the level of the heart and pump ankles as often as possible Elevate leg(s) parallel to the floor when sitting. DO YOUR BEST to sleep in the bed at night. DO NOT sleep in your recliner. Long hours of sitting in a recliner leads to swelling of the legs and/or potential wounds on your  backside. WOUND #8: - Ankle Wound Laterality: Left, Medial Cleanser: Normal Saline 3 x Per Week/30 Days Discharge Instructions: Wash your hands with soap and water. Remove  old dressing, discard into plastic bag and place into trash. Cleanse the wound with Normal Saline prior to applying a clean dressing using gauze sponges, not tissues or cotton balls. Do not scrub or use excessive force. Pat dry using gauze sponges, not tissue or cotton balls. Peri-Wound Care: Desitin Maximum Strength Ointment 4 (oz) 3 x Per Week/30 Days Discharge Instructions: Apply zinc periwound Primary Dressing: Hydrofera Blue Ready Transfer Foam, 2.5x2.5 (in/in) 3 x Per Week/30 Days Discharge Instructions: Apply Hydrofera Blue Ready to wound bed as directed (EXTRA SENT WITH PATIENT, PLEASE USE) Secondary Dressing: Kerlix 4.5 x 4.1 (in/yd) 3 x Per Week/30 Days Discharge Instructions: Apply kerlix over ABD pad DELWIN, RACZKOWSKI ([id number]) Secured With: 69M Medipore H Soft Cloth Surgical Tape, 2x2 (in/yd) 3 x Per Week/30 Days Discharge Instructions: Secure kerlix 1. I am continuing with [facility] although if indeed he is getting this at the facility may be ready to change this in the next week if there is not measurable improvement. 2. He apparently has had MolecuLight done #3 ABIs were checked previously in the normal range 4. No evidence of surrounding infection Electronic Signature(s) Signed: 05/06/2021 5:14:01 PM By: Linton Ham MD Entered By: Linton Ham on 05/06/2021 09:17:17 Chad [name] ([id number]) -------------------------------------------------------------------------------- SuperBill Details Patient Name: Chad [name] Date of Service: 05/06/2021 Medical Record Number: [id number] Patient Account Number: [id number] Date of Birth/Sex: 1960-11-23 (60 y.o. M) Treating RN: Dolan Amen Primary Care Provider: SYSTEM, PCP Other Clinician: Referring Provider: Clayborn Bigness Treating  Provider/Extender: Tito Dine in Treatment: 37 Diagnosis Coding ICD-10 Codes Code Description I87.2 Venous insufficiency (chronic) (peripheral) L97.322 Non-pressure chronic ulcer of left ankle with fat layer exposed 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 side Facility Procedures CPT4 Code: [id number] Description: 67014 - DEB SUBQ TISSUE 20 SQ CM/< Modifier: Quantity: 1 CPT4 Code: Description: ICD-10 Diagnosis Description D03.013 Non-pressure chronic ulcer of left ankle with fat layer exposed I87.2 Venous insufficiency (chronic) (peripheral) Modifier: Quantity: Physician Procedures CPT4 Code: [id number] Description: 57972 - WC PHYS SUBQ TISS 20 SQ CM Modifier: Quantity: 1 CPT4 Code: Description: ICD-10 Diagnosis Description Q20.601 Non-pressure chronic ulcer of left ankle with fat layer exposed I87.2 Venous insufficiency (chronic) (peripheral) Modifier: Quantity: Electronic Signature(s) Signed: 05/06/2021 5:14:01 PM By: Linton Ham MD Entered By: Linton Ham on 05/06/2021 09:17:30

## 2021-05-06 NOTE — Progress Notes (Signed)
CHLOE, BAIG ([id number]) Visit Report for 05/06/2021 Arrival Information Details Patient Name: Chad [name] Date of Service: 05/06/2021 9:00 AM Medical Record Number: [id number] Patient Account Number: [id number] Date of Birth/Sex: 29-Dec-1960 (60 y.o. M) Treating RN: Dolan Amen Primary Care Edna Grover: SYSTEM, PCP Other Clinician: Referring Nicolo Tomko: Clayborn Bigness Treating Wiley Flicker/Extender: Tito Dine in Treatment: 62 Visit Information History Since Last Visit Pain Present Now: No Patient Arrived: Wheel Chair Arrival Time: 08:54 Accompanied By: self Transfer Assistance: Stretcher Patient Identification Verified: Yes Secondary Verification Process Completed: Yes Patient Requires Transmission-Based No Precautions: Patient Has Alerts: Yes Patient Alerts: Patient on Blood Thinner ABI right 1.16 ABI left 1.14 Electronic Signature(s) Signed: 05/06/2021 5:03:00 PM By: Dolan Amen RN Entered By: Dolan Amen on 05/06/2021 08:54:49 Chad [name] ([id number]) -------------------------------------------------------------------------------- Clinic Level of Care Assessment Details Patient Name: Chad [name] Date of Service: 05/06/2021 9:00 AM Medical Record Number: [id number] Patient Account Number: [id number] Date of Birth/Sex: Jun 16, 1961 (60 y.o. M) Treating RN: Dolan Amen Primary Care Ali Mclaurin: SYSTEM, PCP Other Clinician: Referring Netta Fodge: Clayborn Bigness Treating Zeshan Sena/Extender: Tito Dine in Treatment: 37 Clinic Level of Care Assessment Items TOOL 1 Quantity Score []  - Use when EandM and Procedure is performed on INITIAL visit 0 ASSESSMENTS - Nursing Assessment / Reassessment []  - General Physical Exam (combine w/ comprehensive assessment (listed just below) when performed on new 0 pt. evals) []  - 0 Comprehensive Assessment (HX, ROS, Risk Assessments, Wounds Hx, etc.) ASSESSMENTS - Wound and Skin Assessment / Reassessment []  -  Dermatologic / Skin Assessment (not related to wound area) 0 ASSESSMENTS - Ostomy and/or Continence Assessment and Care []  - Incontinence Assessment and Management 0 []  - 0 Ostomy Care Assessment and Management (repouching, etc.) PROCESS - Coordination of Care []  - Simple Patient / Family Education for ongoing care 0 []  - 0 Complex (extensive) Patient / Family Education for ongoing care []  - 0 Staff obtains Programmer, systems, Records, Test Results / Process Orders []  - 0 Staff telephones HHA, Nursing Homes / Clarify orders / etc []  - 0 Routine Transfer to another Facility (non-emergent condition) []  - 0 Routine Hospital Admission (non-emergent condition) []  - 0 New Admissions / Biomedical engineer / Ordering NPWT, Apligraf, etc. []  - 0 Emergency Hospital Admission (emergent condition) PROCESS - Special Needs []  - Pediatric / Minor Patient Management 0 []  - 0 Isolation Patient Management []  - 0 Hearing / Language / Visual special needs []  - 0 Assessment of Community assistance (transportation, D/C planning, etc.) []  - 0 Additional assistance / Altered mentation []  - 0 Support Surface(s) Assessment (bed, cushion, seat, etc.) INTERVENTIONS - Miscellaneous []  - External ear exam 0 []  - 0 Patient Transfer (multiple staff / Civil Service fast streamer / Similar devices) []  - 0 Simple Staple / Suture removal (25 or less) []  - 0 Complex Staple / Suture removal (26 or more) []  - 0 Hypo/Hyperglycemic Management (do not check if billed separately) []  - 0 Ankle / Brachial Index (ABI) - do not check if billed separately Has the patient been seen at the hospital within the last three years: Yes Total Score: 0 Level Of Care: ____ Chad [name] ([id number]) Electronic Signature(s) Signed: 05/06/2021 5:03:00 PM By: Dolan Amen RN Entered By: Dolan Amen on 05/06/2021 09:14:50 Chad [name]  ([id number]) -------------------------------------------------------------------------------- Encounter Discharge Information Details Patient Name: Chad [name] Date of Service: 05/06/2021 9:00 AM Medical Record Number: [id number] Patient Account Number: [id number] Date of Birth/Sex: May 28, 1961 (60 y.o. M) Treating RN: Dolan Amen Primary Care Bellanie Matthew: SYSTEM, PCP Other Clinician: Referring  Kanani Mowbray: Clayborn Bigness Treating Kasir Hallenbeck/Extender: Tito Dine in Treatment: 37 Encounter Discharge Information Items Post Procedure Vitals Discharge Condition: Stable Temperature (F): 97.8 Ambulatory Status: Wheelchair Pulse (bpm): 76 Discharge Destination: Home Respiratory Rate (breaths/min): 18 Transportation: Private Auto Blood Pressure (mmHg): 118/84 Accompanied By: self Schedule Follow-up Appointment: Yes Clinical Summary of Care: Electronic Signature(s) Signed: 05/06/2021 5:03:00 PM By: Dolan Amen RN Entered By: Dolan Amen on 05/06/2021 09:23:41 Chad [name] ([id number]) -------------------------------------------------------------------------------- Lower Extremity Assessment Details Patient Name: Chad [name] Date of Service: 05/06/2021 9:00 AM Medical Record Number: [id number] Patient Account Number: [id number] Date of Birth/Sex: [birth year] (60 y.o. M) Treating RN: Dolan Amen Primary Care Taimi Towe: SYSTEM, PCP Other Clinician: Referring Marisel Tostenson: Clayborn Bigness Treating Isabel Ardila/Extender: Ricard Dillon Weeks in Treatment: 37 Edema Assessment Assessed: [Left: Yes] [Right: No] Edema: [Left: N] [Right: o] Vascular Assessment Pulses: Dorsalis Pedis Palpable: [Left:Yes] Electronic Signature(s) Signed: 05/06/2021 5:03:00 PM By: Dolan Amen RN Entered By: Dolan Amen on 05/06/2021 09:04:10 Chad [name] ([id number]) -------------------------------------------------------------------------------- Multi Wound Chart Details Patient Name:  Chad [name] Date of Service: 05/06/2021 9:00 AM Medical Record Number: [id number] Patient Account Number: [id number] Date of Birth/Sex: [birth year] (60 y.o. M) Treating RN: Dolan Amen Primary Care Machai Desmith: SYSTEM, PCP Other Clinician: Referring Nuno Brubacher: Clayborn Bigness Treating Angeligue Bowne/Extender: Tito Dine in Treatment: 37 Vital Signs Height(in): 9 Pulse(bpm): 16 Weight(lbs): 160 Blood Pressure(mmHg): 118/84 Body Mass Index(BMI): 25 Temperature(F): 97.8 Respiratory Rate(breaths/min): 18 Photos: [N/A:N/A] Wound Location: Left, Medial Ankle N/A N/A Wounding Event: Gradually Appeared N/A N/A Primary Etiology: Venous Leg Ulcer N/A N/A Comorbid History: Anemia, Chronic Obstructive N/A N/A Pulmonary Disease (COPD), Hepatitis C, Received Chemotherapy Date Acquired: 07/12/2019 N/A N/A Weeks of Treatment: 37 N/A N/A Wound Status: Open N/A N/A Measurements L x W x D (cm) 1.8x1.2x0.1 N/A N/A Area (cm) : 1.696 N/A N/A Volume (cm) : 0.17 N/A N/A % Reduction in Area: 71.20% N/A N/A % Reduction in Volume: 71.10% N/A N/A Classification: Full Thickness Without Exposed N/A N/A Support Structures Exudate Amount: Medium N/A N/A Exudate Type: Serosanguineous N/A N/A Exudate Color: red, brown N/A N/A Wound Margin: Flat and Intact N/A N/A Granulation Amount: Large (67-100%) N/A N/A Granulation Quality: Red, Pink N/A N/A Necrotic Amount: Small (1-33%) N/A N/A Exposed Structures: Fat Layer (Subcutaneous Tissue): N/A N/A Yes Fascia: No Tendon: No Muscle: No Joint: No Bone: No Epithelialization: None N/A N/A Debridement: Debridement - Excisional N/A N/A Pre-procedure Verification/Time 09:08 N/A N/A Out Taken: Tissue Debrided: Subcutaneous, Slough N/A N/A Level: Skin/Subcutaneous Tissue N/A N/A Debridement Area (sq cm): 2.16 N/A N/A Instrument: Curette N/A N/A Bleeding: Minimum N/A N/A Hemostasis Achieved: Pressure N/A N/A Debridement Treatment Procedure was  tolerated well N/A N/A ResponseTYSEAN, Chad [name] ([id number]) Post Debridement 1.8x1.2x0.2 N/A N/A Measurements L x W x D (cm) Post Debridement Volume: 0.339 N/A N/A (cm) Procedures Performed: Debridement N/A N/A Treatment Notes Electronic Signature(s) Signed: 05/06/2021 5:14:01 PM By: Linton Ham MD Entered By: Linton Ham on 05/06/2021 09:14:21 Chad [name] ([id number]) -------------------------------------------------------------------------------- Multi-Disciplinary Care Plan Details Patient Name: Chad [name] Date of Service: 05/06/2021 9:00 AM Medical Record Number: [id number] Patient Account Number: [id number] Date of Birth/Sex: 16-Jun-1961 (60 y.o. M) Treating RN: Dolan Amen Primary Care Tiffanyann Deroo: SYSTEM, PCP Other Clinician: Referring Shandie Bertz: Clayborn Bigness Treating Tarena Gockley/Extender: Tito Dine in Treatment: 37 Active Inactive Electronic Signature(s) Signed: 05/06/2021 5:03:00 PM By: Dolan Amen RN Entered By: Dolan Amen on 05/06/2021 09:07:13 Chad [name] ([id number]) -------------------------------------------------------------------------------- Pain Assessment Details Patient Name: Chad [name] Date of Service: 05/06/2021 9:00 AM Medical Record  Number: [id number] Patient Account Number: [id number] Date of Birth/Sex: 10/03/1960 (60 y.o. M) Treating RN: Dolan Amen Primary Care Janson Lamar: SYSTEM, PCP Other Clinician: Referring Everlina Gotts: Clayborn Bigness Treating Marshia Tropea/Extender: Tito Dine in Treatment: 37 Active Problems Location of Pain Severity and Description of Pain Patient Has Paino No Site Locations Pain Management and Medication Current Pain Management: Electronic Signature(s) Signed: 05/06/2021 5:03:00 PM By: Dolan Amen RN Entered By: Dolan Amen on 05/06/2021 08:56:24 Chad [name]  ([id number]) -------------------------------------------------------------------------------- Patient/Caregiver Education Details Patient Name: Chad [name] Date of Service: 05/06/2021 9:00 AM Medical Record Number: [id number] Patient Account Number: [id number] Date of Birth/Gender: 08/30/1960 (60 y.o. M) Treating RN: Dolan Amen Primary Care Physician: SYSTEM, PCP Other Clinician: Referring Physician: Clayborn Bigness Treating Physician/Extender: Tito Dine in Treatment: 83 Education Assessment Education Provided To: Patient Education Topics Provided Wound Debridement: Methods: Explain/Verbal Responses: State content correctly Wound/Skin Impairment: Methods: Explain/Verbal Responses: State content correctly Electronic Signature(s) Signed: 05/06/2021 5:03:00 PM By: Dolan Amen RN Entered By: Dolan Amen on 05/06/2021 09:15:05 Chad [name] ([id number]) -------------------------------------------------------------------------------- Wound Assessment Details Patient Name: Chad [name] Date of Service: 05/06/2021 9:00 AM Medical Record Number: [id number] Patient Account Number: [id number] Date of Birth/Sex: 1960-12-19 (60 y.o. M) Treating RN: Dolan Amen Primary Care Danny Zimny: SYSTEM, PCP Other Clinician: Referring Gionni Vaca: Clayborn Bigness Treating Gaylin Osoria/Extender: Tito Dine in Treatment: 37 Wound Status Wound Number: 8 Primary Venous Leg Ulcer Etiology: Wound Location: Left, Medial Ankle Wound Open Wounding Event: Gradually Appeared Status: Date Acquired: 07/12/2019 Comorbid Anemia, Chronic Obstructive Pulmonary Disease (COPD), Weeks Of Treatment: 37 History: Hepatitis C, Received Chemotherapy Clustered Wound: No Photos Wound Measurements Length: (cm) 1.8 Width: (cm) 1.2 Depth: (cm) 0.1 Area: (cm) 1.696 Volume: (cm) 0.17 % Reduction in Area: 71.2% % Reduction in Volume: 71.1% Epithelialization: None Tunneling:  No Undermining: No Wound Description Classification: Full Thickness Without Exposed Support Structures Wound Margin: Flat and Intact Exudate Amount: Medium Exudate Type: Serosanguineous Exudate Color: red, brown Foul Odor After Cleansing: No Slough/Fibrino Yes Wound Bed Granulation Amount: Large (67-100%) Exposed Structure Granulation Quality: Red, Pink Fascia Exposed: No Necrotic Amount: Small (1-33%) Fat Layer (Subcutaneous Tissue) Exposed: Yes Necrotic Quality: Adherent Slough Tendon Exposed: No Muscle Exposed: No Joint Exposed: No Bone Exposed: No Treatment Notes Wound #8 (Ankle) Wound Laterality: Left, Medial Cleanser Normal Saline Discharge Instruction: Wash your hands with soap and water. Remove old dressing, discard into plastic bag and place into trash. Cleanse the wound with Normal Saline prior to applying a clean dressing using gauze sponges, not tissues or cotton balls. Do not scrub or use excessive force. Pat dry using gauze sponges, not tissue or cotton balls. Chad [name], Chad [name] ([id number]) Peri-Wound Care Desitin Maximum Strength Ointment 4 (oz) Discharge Instruction: Apply zinc periwound Topical Primary Dressing Hydrofera Blue Ready Transfer Foam, 2.5x2.5 (in/in) Discharge Instruction: Apply Hydrofera Blue Ready to wound bed as directed (EXTRA SENT WITH PATIENT, PLEASE USE) Secondary Dressing Kerlix 4.5 x 4.1 (in/yd) Discharge Instruction: Apply kerlix over ABD pad Secured With 46M [employer] Surgical Tape, 2x2 (in/yd) Discharge Instruction: Secure kerlix Compression Wrap Compression Stockings Add-Ons Electronic Signature(s) Signed: 05/06/2021 5:03:00 PM By: Dolan Amen RN Entered By: Dolan Amen on 05/06/2021 09:02:37 Chad [name] ([id number]) -------------------------------------------------------------------------------- [place] Details Patient Name: Chad [name] Date of Service: 05/06/2021 9:00 AM Medical Record Number:  [id number] Patient Account Number: [id number] Date of Birth/Sex: 30-Sep-1960 (60 y.o. M) Treating RN: Dolan Amen Primary Care Aliceson Dolbow: SYSTEM, PCP Other Clinician: Referring Doriann Zuch: Clayborn Bigness Treating Cristiano Capri/Extender: Tito Dine in  Treatment: 37 Vital Signs Time Taken: 08:54 Temperature (F): 97.8 Height (in): 67 Pulse (bpm): 76 Weight (lbs): 160 Respiratory Rate (breaths/min): 18 Body Mass Index (BMI): 25.1 Blood Pressure (mmHg): 118/84 Reference Range: 80 - 120 mg / dl Electronic Signature(s) Signed: 05/06/2021 5:03:00 PM By: Dolan Amen RN Entered By: Dolan Amen on 05/06/2021 08:56:11

## 2021-05-14 ENCOUNTER — Encounter: Payer: Medicaid Other | Admitting: Physician Assistant

## 2021-05-14 ENCOUNTER — Other Ambulatory Visit: Payer: Self-pay

## 2021-05-14 DIAGNOSIS — L97322 Non-pressure chronic ulcer of left ankle with fat layer exposed: Secondary | ICD-10-CM | POA: Diagnosis not present

## 2021-05-14 NOTE — Progress Notes (Signed)
DEMARUS, LATTERELL ([id number]) Visit Report for 05/14/2021 Chief Complaint Document Details Patient Name: Chad [name], Chad [name] Date of Service: 05/14/2021 8:15 AM Medical Record Number: [id number] Patient Account Number: [id number] Date of Birth/Sex: 09-11-1960 (60 y.o. M) Treating RN: Dolan Amen Primary Care Provider: SYSTEM, PCP Other Clinician: Referring Provider: Clayborn Bigness Treating Provider/Extender: Skipper Cliche in Treatment: 39 Information Obtained from: Patient Chief Complaint Left ankle and right foot and heel ulcers Electronic Signature(s) Signed: 05/14/2021 4:43:23 PM By: Worthy Keeler PA-C Entered By: Worthy Keeler on 05/14/2021 08:23:40 Chad [name] ([id number]) -------------------------------------------------------------------------------- Debridement Details Patient Name: Chad [name] Date of Service: 05/14/2021 8:15 AM Medical Record Number: [id number] Patient Account Number: [id number] Date of Birth/Sex: 1960-11-09 (60 y.o. M) Treating RN: Dolan Amen Primary Care Provider: SYSTEM, PCP Other Clinician: Referring Provider: Clayborn Bigness Treating Provider/Extender: Skipper Cliche in Treatment: 39 Debridement Performed for Wound #8 Left,Medial Ankle Assessment: Performed By: Physician Tommie Sams., PA-C Debridement Type: Debridement Severity of Tissue Pre Debridement: Fat layer exposed Level of Consciousness (Pre- Awake and Alert procedure): Pre-procedure Verification/Time Out Yes - 08:49 Taken: Start Time: 08:49 Total Area Debrided (L x W): 1.7 (cm) x 1 (cm) = 1.7 (cm) Tissue and other material Viable, Non-Viable, Slough, Subcutaneous, Biofilm, Slough debrided: Level: Skin/Subcutaneous Tissue Debridement Description: Excisional Instrument: Curette Bleeding: Minimum Hemostasis Achieved: Pressure Response to Treatment: Procedure was tolerated well Level of Consciousness (Post- Awake and Alert procedure): Post Debridement  Measurements of Total Wound Length: (cm) 1.7 Width: (cm) 1 Depth: (cm) 0.2 Volume: (cm) 0.267 Character of Wound/Ulcer Post Debridement: Stable Severity of Tissue Post Debridement: Fat layer exposed Post Procedure Diagnosis Same as Pre-procedure Electronic Signature(s) Signed: 05/14/2021 4:32:38 PM By: Dolan Amen RN Signed: 05/14/2021 4:43:23 PM By: Worthy Keeler PA-C Entered By: Dolan Amen on 05/14/2021 08:49:57 Chad [name] ([id number]) -------------------------------------------------------------------------------- HPI Details Patient Name: Chad [name] Date of Service: 05/14/2021 8:15 AM Medical Record Number: [id number] Patient Account Number: [id number] Date of Birth/Sex: 07-28-1961 (60 y.o. M) Treating RN: Dolan Amen Primary Care Provider: SYSTEM, PCP Other Clinician: Referring Provider: Clayborn Bigness Treating Provider/Extender: Skipper Cliche in Treatment: 39 History of Present Illness HPI Description: 10/08/18 on evaluation today patient actually presents to our office for initial evaluation concerning wounds that he has of the bilateral lower extremities. 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 He also has bilateral venous stasis. He apparently has been homeless currently following discharge from the hospital apparently he has been placed at [facility] which is is a skilled nursing facility locally. Nonetheless fortunately he does not show any signs of infection at this time which is good news. In fact several of the wound actually appears to be showing some signs of improvement already in my pinion. There are a couple areas in the left leg in particular there likely gonna require some sharp debridement to help clear away some necrotic tissue and help with more sufficient healing. No fevers,  chills, nausea, or vomiting noted at this time. 10/15/18 on evaluation today patient actually appears to be doing very well in regard to his bilateral lower extremities. He's been tolerating the dressing changes without complication. Fortunately there does not appear to be any evidence of active infection at this time which is great news. Overall I'm actually very pleased with how this has progressed in just one visits time. Readmission: 08/14/2020 upon evaluation today  patient presents for re-evaluation here in our clinic. He is having issues with his left ankle region as well as his right toe and his right heel. He tells me that the toe and heel actually began as a area that was itching that he was scratching and then subsequently opened up into wounds. These may have been abscess areas I presume based on what I am seeing currently. With regard to his left ankle region he tells me this was a similar type occurrence although he does have venous stasis this very well may be more of a venous leg ulcer more than anything. Nonetheless I do believe that the patient would benefit from appropriate and aggressive wound care to try to help get things under better control here. He does have history of a stroke on the left side affecting him to some degree there that he is able to stand although he does have some residual weakness. Otherwise again the patient does have chronic venous insufficiency as previously noted. His arterial studies most recently obtained showed that he had an ABI on the right of 1.16 with a TBI of 0.52 and on the left and ABI of 1.14 with a TBI of 0.81. That was obtained on 06/19/2020. 08/28/2020 upon evaluation today patient appears to be doing decently well in regard to his wounds in general. He has been tolerating the dressing changes without complication. Fortunately there does not appear to be any signs of active infection which is great news. With that being said I think the  [facility] is doing a good job I would recommend that we likely continue with that currently. 09/11/2020 upon evaluation today patient's wounds did not appear to be doing too poorly but again he is not really showing signs of significant improvement with regard to any of the wounds on the right. None of them have Hydrofera Blue on them I am not exactly sure why this is not being followed as the facility did not contact us to let us know of any issues with obtaining dressings or otherwise. With that being said he is supposed to be using Hydrofera Blue on both of the wounds on the right foot as well as the ankle wound on the left side. 09/18/2020 upon evaluation today patient appears to be doing poorly with regard to his wounds. Again right now the left ankle in particular showed signs of extreme maceration. Apparently he was told by someone with staff at [facility] they could not get the [facility]. With that being said this is something that is never been relayed to [place] one way or another. Also the patient subsequently has not supposed to have a border gauze dressing on. He should have an ABD pad and roll gauze to secure as this drains much too much just to have a border gauze dressing to cover. Nonetheless the fact that they are not using the appropriate dressing is directly causing deterioration of the left ankle wound it is significantly worse today compared to what it was previous. I did attempt to call [facility] while the patient was here I called three times and got no one to even pick up the phone. After this I had my for an office coordinator call and she was able to finally get through and leave a message with the D ON as of dictation of this note which is roughly about an hour and a half later I still have not been able to speak with anyone at the facility.  09/25/2020 upon evaluation today patient actually showing signs of good improvement which is excellent news. He has  been tolerating the dressing changes without complication. Fortunately there is no signs of active infection which is great news. No fevers, chills, nausea, vomiting, or diarrhea. I do feel like the facility has been doing a much better job at taking care of him as far as the dressings are concerned. However the director of nursing never did call me back. 10/09/2020 upon evaluation today patient appears to be doing well with regard to his wound. The toe ulcer did require some debridement but the other 2 areas actually appear to be doing quite well. 10/19/2020 upon evaluation today patient actually appears to be doing very well in regard to his wounds. In fact the heel does appear to be completely healed. The toe is doing better in the medial ankle on the left is also doing better. Overall I think he is headed in the right direction. 10/26/2020 upon evaluation today patient appears to be doing well with regard to his wound. He is showing signs of improvement which is great news and overall I am very pleased with where things stand today. No fevers, chills, nausea, vomiting, or diarrhea. 11/02/2020 upon evaluation today patient appears to be doing well with regard to his wounds. He has been tolerating the dressing changes without complication overall I am extremely pleased with where things stand today. He in regard to the toe is almost completely healed and the medial ankle on the left is doing much better. 11/09/2020 upon evaluation today patient appears to be doing a little poorly in regard to his left medial ankle ulcer. Fortunately there does not appear to be any signs of systemic infection but unfortunately locally he does appear to be infected in fact he has blue-green drainage consistent with Pseudomonas. Chad [name], Chad [name] ([id number]) 11/16/2020 upon evaluation today patient appears to be doing well with regard to his wound. It actually appears to be doing better. I did place him on gentamicin cream  since the Cipro was actually resistant even though he was positive for Pseudomonas on culture. Overall I think that he does seem to be doing better though I am unsure whether or not they have actually been putting the cream on. The patient is not sure that we did talk to the nurse directly and she was going to initiate that treatment. Fortunately there does not appear to be any signs of active infection at this time. No fevers, chills, nausea, vomiting, or diarrhea. 4/28; the area on the right second toe is close to healed. Left medial ankle required debridement 12/07/2020 upon evaluation today patient appears to be doing well with regard to his wounds. In fact the right second toe appears to be completely healed which is great news. Fortunately there does not appear to be any signs of active infection at this time which is also great news. I think we can probably discontinue the gentamicin on top of everything else. 12/14/2020 upon evaluation today patient appears to be doing well with regard to his wound. He is making good progress and overall very pleased with where things stand today. There is no signs of active infection at this time which is great news. 12/28/2020 upon evaluation today patient appears to be doing well with regard to his wounds. He has been tolerating the dressing changes without complication. Fortunately there is no signs of active infection at this time. No fevers, chills, nausea, vomiting, or diarrhea. 12/28/2020 upon evaluation today  patient's wound bed actually showed signs of excellent improvement. He has great epithelization and granulation I do not see any signs of infection overall I am extremely pleased with where things stand at this point. No fevers, chills, nausea, vomiting, or diarrhea. 01/11/2021 upon evaluation today patient appears to be doing well with regard to his wound on his leg. He has been tolerating the dressing changes without complication. Fortunately there  does not appear to be any signs of active infection which is great news. No fevers, chills, nausea, vomiting, or diarrhea. 01/25/2021 upon evaluation today patient appears to be doing well with regard to his wound. He has been tolerating the dressing changes without complication. Fortunately the collagen seems to be doing a great job which is excellent news. No fevers, chills, nausea, vomiting, or diarrhea. 02/08/2021 upon evaluation today patient's wound is actually looking a little bit worse especially in the periwound compared to previous. Fortunately there does not appear to be any signs of infection which is great news with that being said he does have some irritation around the periphery of the wound which has me more concerned. He actually had a dressing on that had not been changed in 3 days. He also is supposed to have daily dressing changes. With regard to the dressing applied he had a silver alginate dressing and silver collagen is what is recommended and ordered. He also had no Desitin around the edges of the wound in the periwound region although that is on the order inspect to be done as well. In general I was very concerned I did contact [facility] actually spoke with Magda Paganini who is the scheduling individual and subsequently she stated that she would pass the information to the D ON apparently the D ON was not available to talk to me when I call today. 02/18/2021 upon evaluation today patient's wound is actually showing signs of improvement. Fortunately there does not appear to be any evidence of infection which is great news overall I am extremely pleased with where things stand today. No fevers, chills, nausea, vomiting, or diarrhea. 8/3; patient presents for 1 week follow-up. He has no issues or complaints today. He denies signs of infection. 03/11/2021 upon evaluation today patient appears to be doing well with regard to his wound. He does have a little bit of slough noted on  the surface of the wound but fortunately there does not appear to be any signs of active infection at this time. No fevers, chills, nausea, vomiting, or diarrhea. 03/18/2021 upon evaluation today patient appears to be doing well with regard to his wound. He has been tolerating the dressing changes without complication. There was a little irritation more proximal to where the wound was that was not noted last week but nonetheless this is very superficial just seems to be more irritation we just need to make sure to put a good amount of the zinc over the area in my opinion. Otherwise he does not seem to be doing significantly worse at all which is great news. 03/25/2021 upon evaluation today patient appears to be doing well with regard to his wound. He is going require some sharp debridement today to clear with some of the necrotic debris. I did perform this today without complication postdebridement wound bed appears to be doing much better this is great news. 04/08/2021 upon evaluation today patient appears to be doing decently well in regard to his wound although the overall measurement is not significantly smaller compared to previous. It is  gone down a little bit but still the facility continues to not really put the appropriate dressings in place in fact he was supposed to have collagen we think he probably had more of an allergy to At this point. Fortunately there does not appear to be any signs of active infection systemically though locally I do not see anything on initial visualization either as far as erythema or warmth. 04/15/2021 upon evaluation today patient appears to be doing well with regard to his wound. He is actually showing signs of improvement. I did place him on antibiotics last week, Cipro. He has been taking that 2 times a day and seems to be tolerating it very well. I do not see any evidence of worsening and in fact the overall appearance of the wound is smaller today which is  also great news. 9/26; left medial ankle chronic venous insufficiency wound is improved. Using Hydrofera Blue 10/10; left medial ankle chronic venous insufficiency. Wound has not changed much in appearance completely nonviable surface. Apparently there have been problems getting the right product on the wound at the facility although he came in with [facility] on today 05/14/2021 upon evaluation today patient appears to be doing well with regard to his wound. I think he is making progress here which is good news. Fortunately there does not appear to be any signs of active infection at this time. No fevers, chills, nausea, vomiting, or diarrhea. Electronic Signature(s) Signed: 05/14/2021 9:24:20 AM By: Worthy Keeler PA-C Entered By: Worthy Keeler on 05/14/2021 09:24:19 Chad [name] ([id number]) -------------------------------------------------------------------------------- Physical Exam Details Patient Name: Chad [name] Date of Service: 05/14/2021 8:15 AM Medical Record Number: [id number] Patient Account Number: [id number] Date of Birth/Sex: November 12, 1960 (60 y.o. M) Treating RN: Dolan Amen Primary Care Provider: SYSTEM, PCP Other Clinician: Referring Provider: Clayborn Bigness Treating Provider/Extender: Skipper Cliche in Treatment: 66 Constitutional Well-nourished and well-hydrated in no acute distress. Respiratory normal breathing without difficulty. Psychiatric this patient is able to make decisions and demonstrates good insight into disease process. Alert and Oriented x 3. pleasant and cooperative. Notes Upon inspection patient's wound bed showed signs of good granulation epithelization at this point. Fortunately there does not appear to be anything worsening which is great news as well. I did have to perform sharp debridement to clear away some of the necrotic debris which he tolerated today without complication. Post debridement the wound bed appears to be doing much  better. Electronic Signature(s) Signed: 05/14/2021 9:25:24 AM By: Worthy Keeler PA-C Previous Signature: 05/14/2021 9:24:59 AM Version By: Worthy Keeler PA-C Entered By: Worthy Keeler on 05/14/2021 09:25:23 Chad [name] ([id number]) -------------------------------------------------------------------------------- Physician Orders Details Patient Name: Chad [name] Date of Service: 05/14/2021 8:15 AM Medical Record Number: [id number] Patient Account Number: [id number] Date of Birth/Sex: [birth year] (60 y.o. 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 Bathing/ Shower/ Hygiene o May shower; gently cleanse wound with antibacterial soap, rinse and pat dry prior to dressing wounds Edema Control - Lymphedema / Segmental Compressive Device / Other o Elevate legs to the level of the heart and pump ankles as often as possible o Elevate leg(s) parallel  to the floor when sitting. o DO YOUR BEST to sleep in the bed at night. DO NOT sleep in your recliner. Long hours of sitting in a recliner leads to swelling of the legs and/or potential wounds on your backside. Wound Treatment Wound #8 - Ankle Wound Laterality: Left, Medial Cleanser: Normal Saline 3 x Per Week/30 Days Discharge Instructions: Wash your hands with soap and water. Remove old dressing, discard into plastic bag and place into trash. Cleanse the wound with Normal Saline prior to applying a clean dressing using gauze sponges, not tissues or  cotton balls. Do not scrub or use excessive force. Pat dry using gauze sponges, not tissue or cotton balls. Peri-Wound Care: Desitin Maximum Strength Ointment 4 (oz) 3 x Per Week/30 Days Discharge Instructions: Apply zinc periwound Primary Dressing: Hydrofera Blue Ready Transfer Foam, 2.5x2.5 (in/in) 3 x Per Week/30 Days Discharge Instructions: Apply Hydrofera Blue Ready to wound bed as directed (EXTRA SENT WITH PATIENT, PLEASE USE) Secondary Dressing: Kerlix 4.5 x 4.1 (in/yd) 3 x Per Week/30 Days Discharge Instructions: Apply kerlix over ABD pad Secured With: 81M Medipore H Soft Cloth Surgical Tape, 2x2 (in/yd) 3 x Per Week/30 Days Discharge Instructions: Secure kerlix Electronic Signature(s) Signed: 05/14/2021 4:32:38 PM By: Dolan Amen RN Signed: 05/14/2021 4:43:23 PM By: Worthy Keeler PA-C Entered By: Dolan Amen on 05/14/2021 08:50:41 Chad [name] ([id number]) -------------------------------------------------------------------------------- Problem List Details Patient Name: Chad [name] Date of Service: 05/14/2021 8:15 AM Medical Record Number: [id number] Patient Account Number: [id number] Date of Birth/Sex: 1961/03/06 (60 y.o. 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 08/14/2020 No Yes non-dominant side Inactive Problems ICD-10 Code Description Active Date Inactive Date L97.412 Non-pressure chronic ulcer of right heel and midfoot with fat layer exposed 08/14/2020 08/14/2020 L97.512 Non-pressure chronic ulcer of other part of  right foot with fat layer exposed 08/14/2020 08/14/2020 Resolved Problems Electronic Signature(s) Signed: 05/14/2021 4:43:23 PM By: Worthy Keeler PA-C Entered By: Worthy Keeler on 05/14/2021 58:85:02 Chad [name] ([id number]) -------------------------------------------------------------------------------- Progress Note Details Patient Name: Chad [name] Date of Service: 05/14/2021 8:15 AM Medical Record Number: [id number] Patient Account Number: [id number] Date of Birth/Sex: Dec 16, 1960 (60 y.o. M) Treating RN: Dolan Amen Primary Care Provider: SYSTEM, PCP Other Clinician: Referring Provider: Clayborn Bigness Treating Provider/Extender: Skipper Cliche in Treatment: 39 Subjective Chief Complaint Information obtained from Patient Left ankle and right foot and heel ulcers History of Present Illness (HPI) 10/08/18 on evaluation today patient actually presents to our office for initial evaluation concerning wounds that he has of the bilateral lower extremities. 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 He also has bilateral venous stasis. He apparently has been homeless currently following discharge from the hospital apparently he has been placed at [facility] which is is a skilled nursing facility locally. Nonetheless fortunately he does not show any signs of infection at this time which is good news. In fact several of the wound actually appears to be showing some signs of improvement already in my pinion. There are a couple areas in the  left leg in particular there likely gonna require some sharp debridement to help clear away some necrotic tissue and help with more sufficient healing. No fevers, chills, nausea, or vomiting noted at this time. 10/15/18 on evaluation today patient actually appears to be doing very well in  regard to his bilateral lower extremities. He's been tolerating the dressing changes without complication. Fortunately there does not appear to be any evidence of active infection at this time which is great news. Overall I'm actually very pleased with how this has progressed in just one visits time. Readmission: 08/14/2020 upon evaluation today patient presents for re-evaluation here in our clinic. He is having issues with his left ankle region as well as his right toe and his right heel. He tells me that the toe and heel actually began as a area that was itching that he was scratching and then subsequently opened up into wounds. These may have been abscess areas I presume based on what I am seeing currently. With regard to his left ankle region he tells me this was a similar type occurrence although he does have venous stasis this very well may be more of a venous leg ulcer more than anything. Nonetheless I do believe that the patient would benefit from appropriate and aggressive wound care to try to help get things under better control here. He does have history of a stroke on the left side affecting him to some degree there that he is able to stand although he does have some residual weakness. Otherwise again the patient does have chronic venous insufficiency as previously noted. His arterial studies most recently obtained showed that he had an ABI on the right of 1.16 with a TBI of 0.52 and on the left and ABI of 1.14 with a TBI of 0.81. That was obtained on 06/19/2020. 08/28/2020 upon evaluation today patient appears to be doing decently well in regard to his wounds in general. He has been tolerating the dressing changes without complication. Fortunately there does not appear to be any signs of active infection which is great news. With that being said I think the [facility] is doing a good job I would recommend that we likely continue with that currently. 09/11/2020 upon evaluation today  patient's wounds did not appear to be doing too poorly but again he is not really showing signs of significant improvement with regard to any of the wounds on the right. None of them have Hydrofera Blue on them I am not exactly sure why this is not being followed as the facility did not contact us to let us know of any issues with obtaining dressings or otherwise. With that being said he is supposed to be using Hydrofera Blue on both of the wounds on the right foot as well as the ankle wound on the left side. 09/18/2020 upon evaluation today patient appears to be doing poorly with regard to his wounds. Again right now the left ankle in particular showed signs of extreme maceration. Apparently he was told by someone with staff at [facility] they could not get the [facility]. With that being said this is something that is never been relayed to [place] one way or another. Also the patient subsequently has not supposed to have a border gauze dressing on. He should have an ABD pad and roll gauze to secure as this drains much too much just to have a border gauze dressing to cover. Nonetheless the fact that they are not using the appropriate dressing  is directly causing deterioration of the left ankle wound it is significantly worse today compared to what it was previous. I did attempt to call [facility] while the patient was here I called three times and got no one to even pick up the phone. After this I had my for an office coordinator call and she was able to finally get through and leave a message with the D ON as of dictation of this note which is roughly about an hour and a half later I still have not been able to speak with anyone at the facility. 09/25/2020 upon evaluation today patient actually showing signs of good improvement which is excellent news. He has been tolerating the dressing changes without complication. Fortunately there is no signs of active infection which is great news.  No fevers, chills, nausea, vomiting, or diarrhea. I do feel like the facility has been doing a much better job at taking care of him as far as the dressings are concerned. However the director of nursing never did call me back. 10/09/2020 upon evaluation today patient appears to be doing well with regard to his wound. The toe ulcer did require some debridement but the other 2 areas actually appear to be doing quite well. 10/19/2020 upon evaluation today patient actually appears to be doing very well in regard to his wounds. In fact the heel does appear to be completely healed. The toe is doing better in the medial ankle on the left is also doing better. Overall I think he is headed in the right direction. 10/26/2020 upon evaluation today patient appears to be doing well with regard to his wound. He is showing signs of improvement which is great news and overall I am very pleased with where things stand today. No fevers, chills, nausea, vomiting, or diarrhea. 11/02/2020 upon evaluation today patient appears to be doing well with regard to his wounds. He has been tolerating the dressing changes without complication overall I am extremely pleased with where things stand today. He in regard to the toe is almost completely healed and the medial [facility] ([id number]) ankle on the left is doing much better. 11/09/2020 upon evaluation today patient appears to be doing a little poorly in regard to his left medial ankle ulcer. Fortunately there does not appear to be any signs of systemic infection but unfortunately locally he does appear to be infected in fact he has blue-green drainage consistent with Pseudomonas. 11/16/2020 upon evaluation today patient appears to be doing well with regard to his wound. It actually appears to be doing better. I did place him on gentamicin cream since the Cipro was actually resistant even though he was positive for Pseudomonas on culture. Overall I think that he does seem to  be doing better though I am unsure whether or not they have actually been putting the cream on. The patient is not sure that we did talk to the nurse directly and she was going to initiate that treatment. Fortunately there does not appear to be any signs of active infection at this time. No fevers, chills, nausea, vomiting, or diarrhea. 4/28; the area on the right second toe is close to healed. Left medial ankle required debridement 12/07/2020 upon evaluation today patient appears to be doing well with regard to his wounds. In fact the right second toe appears to be completely healed which is great news. Fortunately there does not appear to be any signs of active infection at this time which is also great news. I  think we can probably discontinue the gentamicin on top of everything else. 12/14/2020 upon evaluation today patient appears to be doing well with regard to his wound. He is making good progress and overall very pleased with where things stand today. There is no signs of active infection at this time which is great news. 12/28/2020 upon evaluation today patient appears to be doing well with regard to his wounds. He has been tolerating the dressing changes without complication. Fortunately there is no signs of active infection at this time. No fevers, chills, nausea, vomiting, or diarrhea. 12/28/2020 upon evaluation today patient's wound bed actually showed signs of excellent improvement. He has great epithelization and granulation I do not see any signs of infection overall I am extremely pleased with where things stand at this point. No fevers, chills, nausea, vomiting, or diarrhea. 01/11/2021 upon evaluation today patient appears to be doing well with regard to his wound on his leg. He has been tolerating the dressing changes without complication. Fortunately there does not appear to be any signs of active infection which is great news. No fevers, chills, nausea, vomiting, or diarrhea. 01/25/2021  upon evaluation today patient appears to be doing well with regard to his wound. He has been tolerating the dressing changes without complication. Fortunately the collagen seems to be doing a great job which is excellent news. No fevers, chills, nausea, vomiting, or diarrhea. 02/08/2021 upon evaluation today patient's wound is actually looking a little bit worse especially in the periwound compared to previous. Fortunately there does not appear to be any signs of infection which is great news with that being said he does have some irritation around the periphery of the wound which has me more concerned. He actually had a dressing on that had not been changed in 3 days. He also is supposed to have daily dressing changes. With regard to the dressing applied he had a silver alginate dressing and silver collagen is what is recommended and ordered. He also had no Desitin around the edges of the wound in the periwound region although that is on the order inspect to be done as well. In general I was very concerned I did contact [facility] actually spoke with Magda Paganini who is the scheduling individual and subsequently she stated that she would pass the information to the D ON apparently the D ON was not available to talk to me when I call today. 02/18/2021 upon evaluation today patient's wound is actually showing signs of improvement. Fortunately there does not appear to be any evidence of infection which is great news overall I am extremely pleased with where things stand today. No fevers, chills, nausea, vomiting, or diarrhea. 8/3; patient presents for 1 week follow-up. He has no issues or complaints today. He denies signs of infection. 03/11/2021 upon evaluation today patient appears to be doing well with regard to his wound. He does have a little bit of slough noted on the surface of the wound but fortunately there does not appear to be any signs of active infection at this time. No fevers, chills,  nausea, vomiting, or diarrhea. 03/18/2021 upon evaluation today patient appears to be doing well with regard to his wound. He has been tolerating the dressing changes without complication. There was a little irritation more proximal to where the wound was that was not noted last week but nonetheless this is very superficial just seems to be more irritation we just need to make sure to put a good amount of the zinc over  the area in my opinion. Otherwise he does not seem to be doing significantly worse at all which is great news. 03/25/2021 upon evaluation today patient appears to be doing well with regard to his wound. He is going require some sharp debridement today to clear with some of the necrotic debris. I did perform this today without complication postdebridement wound bed appears to be doing much better this is great news. 04/08/2021 upon evaluation today patient appears to be doing decently well in regard to his wound although the overall measurement is not significantly smaller compared to previous. It is gone down a little bit but still the facility continues to not really put the appropriate dressings in place in fact he was supposed to have collagen we think he probably had more of an allergy to At this point. Fortunately there does not appear to be any signs of active infection systemically though locally I do not see anything on initial visualization either as far as erythema or warmth. 04/15/2021 upon evaluation today patient appears to be doing well with regard to his wound. He is actually showing signs of improvement. I did place him on antibiotics last week, Cipro. He has been taking that 2 times a day and seems to be tolerating it very well. I do not see any evidence of worsening and in fact the overall appearance of the wound is smaller today which is also great news. 9/26; left medial ankle chronic venous insufficiency wound is improved. Using Hydrofera Blue 10/10; left medial  ankle chronic venous insufficiency. Wound has not changed much in appearance completely nonviable surface. Apparently there have been problems getting the right product on the wound at the facility although he came in with [name] on today 05/14/2021 upon evaluation today patient appears to be doing well with regard to his wound. I think he is making progress here which is good news. Fortunately there does not appear to be any signs of active infection at this time. No fevers, chills, nausea, vomiting, or diarrhea. Chad [name], Chad [name] ([id number]) Objective Constitutional Well-nourished and well-hydrated in no acute distress. Vitals Time Taken: 8:15 AM, Height: 67 in, Weight: 160 lbs, BMI: 25.1, Temperature: 97.6 F, Pulse: 88 bpm, Respiratory Rate: 18 breaths/min, Blood Pressure: 129/83 mmHg. Respiratory normal breathing without difficulty. Psychiatric this patient is able to make decisions and demonstrates good insight into disease process. Alert and Oriented x 3. pleasant and cooperative. General Notes: Upon inspection patient's wound bed showed signs of good granulation epithelization at this point. Fortunately there does not appear to be anything worsening which is great news as well. I did have to perform sharp debridement to clear away some of the necrotic debris which he tolerated today without complication. Post debridement the wound bed appears to be doing much better. Integumentary (Hair, Skin) Wound #8 status is Open. Original cause of wound was Gradually Appeared. The date acquired was: 07/12/2019. The wound has been in treatment 39 weeks. The wound is located on the Left,Medial Ankle. The wound measures 1.7cm length x 1cm width x 0.1cm depth; 1.335cm^2 area and 0.134cm^3 volume. There is Fat Layer (Subcutaneous Tissue) exposed. There is no tunneling or undermining noted. There is a medium amount of serosanguineous drainage noted. The wound margin is flat and intact. There is  small (1-33%) red granulation within the wound bed. There is a large (67-100%) amount of necrotic tissue within the wound bed including Adherent Slough. 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 There was a Excisional Skin/Subcutaneous Tissue Debridement with a total area of 1.7 sq cm performed by Tommie Sams., PA-C. With the following instrument(s): Curette to remove Viable and Non-Viable tissue/material. Material removed includes Subcutaneous Tissue, Slough, and Biofilm. A time out was conducted at 08:49, prior to the start of the procedure. A Minimum amount of bleeding was controlled with Pressure. The procedure was tolerated well. Post Debridement Measurements: 1.7cm length x 1cm width x 0.2cm depth; 0.267cm^3 volume. Character of Wound/Ulcer Post Debridement is stable. Severity of Tissue Post Debridement is: Fat layer exposed. Post procedure Diagnosis Wound #8: Same as Pre-Procedure Plan Follow-up Appointments: Return Appointment in 1 week. Bathing/ Shower/ Hygiene: May shower; gently cleanse wound with antibacterial soap, rinse and pat dry prior to dressing wounds Edema Control - Lymphedema / Segmental Compressive Device / Other: Elevate legs to the level of the heart and pump ankles as often as possible Elevate leg(s) parallel to the floor when sitting. DO YOUR BEST to sleep in the bed at night. DO NOT sleep in your recliner. Long hours of sitting in a recliner leads to swelling of the legs and/or potential wounds on your backside. Chad [name], Chad [name] ([id number]) WOUND #8: - Ankle Wound Laterality: Left, Medial Cleanser: Normal  Saline 3 x Per Week/30 Days Discharge Instructions: Wash your hands with soap and water. Remove old dressing, discard into plastic bag and place into trash. Cleanse the wound with Normal Saline prior to applying a clean dressing using gauze sponges, not tissues or cotton balls. Do not scrub or use excessive force. Pat dry using gauze sponges, not tissue or cotton balls. Peri-Wound Care: Desitin Maximum Strength Ointment 4 (oz) 3 x Per Week/30 Days Discharge Instructions: Apply zinc periwound Primary Dressing: Hydrofera Blue Ready Transfer Foam, 2.5x2.5 (in/in) 3 x Per Week/30 Days Discharge Instructions: Apply Hydrofera Blue Ready to wound bed as directed (EXTRA SENT WITH PATIENT, PLEASE USE) Secondary Dressing: Kerlix 4.5 x 4.1 (in/yd) 3 x Per Week/30 Days Discharge Instructions: Apply kerlix over ABD pad Secured With: 66M Medipore H Soft Cloth Surgical Tape, 2x2 (in/yd) 3 x Per Week/30 Days Discharge Instructions: Secure kerlix 1. I would recommend currently that we going to continue with the wound care measures as before using [facility] which I think is doing a good job. 2. I am also can recommend that we continue with the be monitored for any signs of worsening or infection if anything changes he should let me know soon as possible. We will see patient back for reevaluation in 1 week here in the clinic. If anything worsens or changes patient will contact our office for additional recommendations. Desitin around the edges of the wound to help protect the good skin. 3. I would also suggest that the patient continue to Electronic Signature(s) Signed: 05/14/2021 9:25:55 AM By: Worthy Keeler PA-C Entered By: Worthy Keeler on 05/14/2021 09:25:55 Chad [name] ([id number]) -------------------------------------------------------------------------------- SuperBill Details Patient Name: Chad [name] Date of Service: 05/14/2021 Medical Record Number: [id number] Patient Account  Number: [id number] Date of Birth/Sex: [birth year] (60 y.o. M) Treating RN: Dolan Amen Primary Care Provider: SYSTEM, PCP Other Clinician: Referring Provider: Clayborn Bigness Treating Provider/Extender: Skipper Cliche in Treatment: 39 Diagnosis Coding ICD-10 Codes Code Description I87.2 Venous insufficiency (chronic) (peripheral) L97.322 Non-pressure  chronic ulcer of left ankle with fat layer exposed 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 side Facility Procedures CPT4 Code: 44967591 Description: 63846 - DEB SUBQ TISSUE 20 SQ CM/< Modifier: Quantity: 1 CPT4 Code: Description: ICD-10 Diagnosis Description K59.935 Non-pressure chronic ulcer of left ankle with fat layer exposed Modifier: Quantity: Physician Procedures CPT4 Code: 7017793 Description: 90300 - WC PHYS SUBQ TISS 20 SQ CM Modifier: Quantity: 1 CPT4 Code: Description: ICD-10 Diagnosis Description P23.300 Non-pressure chronic ulcer of left ankle with fat layer exposed Modifier: Quantity: Electronic Signature(s) Signed: 05/14/2021 9:26:20 AM By: Worthy Keeler PA-C Entered By: Worthy Keeler on 05/14/2021 09:26:20

## 2021-05-14 NOTE — Progress Notes (Signed)
Chad [name] ([id number]) Visit Report for 05/14/2021 Arrival Information Details Patient Name: Chad [name] Date of Service: 05/14/2021 8:15 AM Medical Record Number: [id number] Patient Account Number: [id number] Date of Birth/Sex: 19-Apr-1961 (60 y.o. M) Treating RN: Dolan Amen Primary Care Klaryssa Fauth: SYSTEM, PCP Other Clinician: Referring Tammra Pressman: Clayborn Bigness Treating Chayce Rullo/Extender: Skipper Cliche in Treatment: 39 Visit Information History Since Last Visit Pain Present Now: No Patient Arrived: Wheel Chair Arrival Time: 08:14 Accompanied By: self Transfer Assistance: None Patient Identification Verified: Yes Secondary Verification Process Completed: Yes Patient Requires Transmission-Based No Precautions: Patient Has Alerts: Yes Patient Alerts: Patient on Blood Thinner ABI right 1.16 ABI left 1.14 Electronic Signature(s) Signed: 05/14/2021 4:32:38 PM By: Dolan Amen RN Entered By: Dolan Amen on 05/14/2021 08:15:07 Chad [name] ([id number]) -------------------------------------------------------------------------------- Clinic Level of Care Assessment Details Patient Name: Chad [name] Date of Service: 05/14/2021 8:15 AM Medical Record Number: [id number] Patient Account Number: [id number] Date of Birth/Sex: 1961-05-23 (60 y.o. M) Treating RN: Dolan Amen Primary Care Hermelinda Diegel: SYSTEM, PCP Other Clinician: Referring Monika Chestang: Clayborn Bigness Treating Berish Bohman/Extender: Skipper Cliche in Treatment: 39 Clinic Level of Care Assessment Items TOOL 1 Quantity Score []  - Use when EandM and Procedure is performed on INITIAL visit 0 ASSESSMENTS - Nursing Assessment / Reassessment []  - General Physical Exam (combine w/ comprehensive assessment (listed just below) when performed on new 0 pt. evals) []  - 0 Comprehensive Assessment (HX, ROS, Risk Assessments, Wounds Hx, etc.) ASSESSMENTS - Wound and Skin Assessment / Reassessment []  - Dermatologic /  Skin Assessment (not related to wound area) 0 ASSESSMENTS - Ostomy and/or Continence Assessment and Care []  - Incontinence Assessment and Management 0 []  - 0 Ostomy Care Assessment and Management (repouching, etc.) PROCESS - Coordination of Care []  - Simple Patient / Family Education for ongoing care 0 []  - 0 Complex (extensive) Patient / Family Education for ongoing care []  - 0 Staff obtains Programmer, systems, Records, Test Results / Process Orders []  - 0 Staff telephones HHA, Nursing Homes / Clarify orders / etc []  - 0 Routine Transfer to another Facility (non-emergent condition) []  - 0 Routine Hospital Admission (non-emergent condition) []  - 0 New Admissions / Biomedical engineer / Ordering NPWT, Apligraf, etc. []  - 0 Emergency Hospital Admission (emergent condition) PROCESS - Special Needs []  - Pediatric / Minor Patient Management 0 []  - 0 Isolation Patient Management []  - 0 Hearing / Language / Visual special needs []  - 0 Assessment of Community assistance (transportation, D/C planning, etc.) []  - 0 Additional assistance / Altered mentation []  - 0 Support Surface(s) Assessment (bed, cushion, seat, etc.) INTERVENTIONS - Miscellaneous []  - External ear exam 0 []  - 0 Patient Transfer (multiple staff / Civil Service fast streamer / Similar devices) []  - 0 Simple Staple / Suture removal (25 or less) []  - 0 Complex Staple / Suture removal (26 or more) []  - 0 Hypo/Hyperglycemic Management (do not check if billed separately) []  - 0 Ankle / Brachial Index (ABI) - do not check if billed separately Has the patient been seen at the hospital within the last three years: Yes Total Score: 0 Level Of Care: ____ Chad [name] ([id number]) Electronic Signature(s) Signed: 05/14/2021 4:32:38 PM By: Dolan Amen RN Entered By: Dolan Amen on 05/14/2021 08:50:54 Chad [name] ([id number]) -------------------------------------------------------------------------------- Encounter Discharge  Information Details Patient Name: Chad [name] Date of Service: 05/14/2021 8:15 AM Medical Record Number: [id number] Patient Account Number: [id number] Date of Birth/Sex: 1960/09/11 (60 y.o. M) Treating RN: Dolan Amen Primary Care Brittanie Dosanjh: SYSTEM, PCP Other Clinician: Referring Kase Shughart: Humphrey Rolls,  Latricia Heft Treating Katyra Tomassetti/Extender: Skipper Cliche in Treatment: 39 Encounter Discharge Information Items Post Procedure Vitals Discharge Condition: Stable Temperature (F): 97.6 Ambulatory Status: Wheelchair Pulse (bpm): 88 Discharge Destination: [place] Respiratory Rate (breaths/min): 18 Orders Sent: Yes Blood Pressure (mmHg): 129/83 Transportation: Private Auto Accompanied By: self Schedule Follow-up Appointment: Yes Clinical Summary of Care: Electronic Signature(s) Signed: 05/14/2021 9:04:25 AM By: Dolan Amen RN Entered By: Dolan Amen on 05/14/2021 09:04:25 Chad [name] ([id number]) -------------------------------------------------------------------------------- Lower Extremity Assessment Details Patient Name: Chad [name] Date of Service: 05/14/2021 8:15 AM Medical Record Number: [id number] Patient Account Number: [id number] Date of Birth/Sex: [birth year] (60 y.o. M) Treating RN: Dolan Amen Primary Care Gianni Fuchs: SYSTEM, PCP Other Clinician: Referring Annagrace Carr: Clayborn Bigness Treating Cabria Micalizzi/Extender: Jeri Cos Weeks in Treatment: 39 Edema Assessment Assessed: [Left: Yes] [Right: No] Edema: [Left: N] [Right: o] Vascular Assessment Pulses: Dorsalis Pedis Palpable: [Left:Yes] Electronic Signature(s) Signed: 05/14/2021 4:32:38 PM By: Dolan Amen RN Entered By: Dolan Amen on 05/14/2021 08:21:50 Chad [name] ([id number]) -------------------------------------------------------------------------------- Multi Wound Chart Details Patient Name: Chad [name] Date of Service: 05/14/2021 8:15 AM Medical Record Number:  [id number] Patient Account Number: [id number] Date of Birth/Sex: 1961/06/26 (60 y.o. M) Treating RN: Dolan Amen Primary Care Erric Machnik: SYSTEM, PCP Other Clinician: Referring Aliyana Dlugosz: Clayborn Bigness Treating Archana Eckman/Extender: Skipper Cliche in Treatment: 39 Vital Signs Height(in): 1 Pulse(bpm): 78 Weight(lbs): 160 Blood Pressure(mmHg): 129/83 Body Mass Index(BMI): 25 Temperature(F): 97.6 Respiratory Rate(breaths/min): 18 Photos: [N/A:N/A] Wound Location: Left, Medial Ankle N/A N/A Wounding Event: Gradually Appeared N/A N/A Primary Etiology: Venous Leg Ulcer N/A N/A Comorbid History: Anemia, Chronic Obstructive N/A N/A Pulmonary Disease (COPD), Hepatitis C, Received Chemotherapy Date Acquired: 07/12/2019 N/A N/A Weeks of Treatment: 39 N/A N/A Wound Status: Open N/A N/A Measurements L x W x D (cm) 1.7x1x0.1 N/A N/A Area (cm) : 1.335 N/A N/A Volume (cm) : 0.134 N/A N/A % Reduction in Area: 77.30% N/A N/A % Reduction in Volume: 77.20% N/A N/A Classification: Full Thickness Without Exposed N/A N/A Support Structures Exudate Amount: Medium N/A N/A Exudate Type: Serosanguineous N/A N/A Exudate Color: red, brown N/A N/A Wound Margin: Flat and Intact N/A N/A Granulation Amount: Small (1-33%) N/A N/A Granulation Quality: Red N/A N/A Necrotic Amount: Large (67-100%) N/A N/A Exposed Structures: Fat Layer (Subcutaneous Tissue): N/A N/A Yes Fascia: No Tendon: No Muscle: No Joint: No Bone: No Epithelialization: Small (1-33%) N/A N/A Treatment Notes Electronic Signature(s) Signed: 05/14/2021 4:32:38 PM By: Dolan Amen RN Entered By: Dolan Amen on 05/14/2021 08:49:08 Chad [name] ([id number]) -------------------------------------------------------------------------------- Multi-Disciplinary Care Plan Details Patient Name: Chad [name] Date of Service: 05/14/2021 8:15 AM Medical Record Number: [id number] Patient Account Number: [id number] Date of  Birth/Sex: [birth year] (60 y.o. M) Treating RN: Dolan Amen Primary Care Tressie Ragin: SYSTEM, PCP Other Clinician: Referring Che Below: Clayborn Bigness Treating Teresa Nicodemus/Extender: Skipper Cliche in Treatment: 39 Active Inactive Electronic Signature(s) Signed: 05/14/2021 4:32:38 PM By: Dolan Amen RN Entered By: Dolan Amen on 05/14/2021 08:48:43 Chad [name] ([id number]) -------------------------------------------------------------------------------- Pain Assessment Details Patient Name: Chad [name] Date of Service: 05/14/2021 8:15 AM Medical Record Number: [id number] Patient Account Number: [id number] Date of Birth/Sex: August 04, 1960 (60 y.o. M) Treating RN: Dolan Amen Primary Care Annibelle Brazie: SYSTEM, PCP Other Clinician: Referring Nylee Barbuto: Clayborn Bigness Treating Rion Catala/Extender: Skipper Cliche in Treatment: 39 Active Problems Location of Pain Severity and Description of Pain Patient Has Paino No Site Locations Pain Management and Medication Current Pain Management: Electronic Signature(s) Signed: 05/14/2021 4:32:38 PM By: Dolan Amen RN Entered By: Dolan Amen on 05/14/2021 08:15:28 Chad [name] ([id number]) -------------------------------------------------------------------------------- Patient/Caregiver  Education Details Patient Name: GODRIC, LAVELL Date of Service: 05/14/2021 8:15 AM Medical Record Number: [id number] Patient Account Number: [id number] Date of Birth/Gender: January 02, 1961 (60 y.o. M) Treating RN: Dolan Amen Primary Care Physician: SYSTEM, PCP Other Clinician: Referring Physician: Clayborn Bigness Treating Physician/Extender: Skipper Cliche in Treatment: 38 Education Assessment Education Provided To: Patient Education Topics Provided Wound/Skin Impairment: Methods: Explain/Verbal Responses: State content correctly Electronic Signature(s) Signed: 05/14/2021 4:32:38 PM By: Dolan Amen RN Entered By: Dolan Amen on 05/14/2021  08:51:13 Chad [name] ([id number]) -------------------------------------------------------------------------------- Wound Assessment Details Patient Name: Chad [name] Date of Service: 05/14/2021 8:15 AM Medical Record Number: [id number] Patient Account Number: [id number] Date of Birth/Sex: 18-Apr-1961 (60 y.o. M) Treating RN: Dolan Amen Primary Care Giovoni Bunch: SYSTEM, PCP Other Clinician: Referring Azyah Flett: Clayborn Bigness Treating Kaymarie Wynn/Extender: Skipper Cliche in Treatment: 39 Wound Status Wound Number: 8 Primary Venous Leg Ulcer Etiology: Wound Location: Left, Medial Ankle Wound Open Wounding Event: Gradually Appeared Status: Date Acquired: 07/12/2019 Comorbid Anemia, Chronic Obstructive Pulmonary Disease (COPD), Weeks Of Treatment: 39 History: Hepatitis C, Received Chemotherapy Clustered Wound: No Photos Wound Measurements Length: (cm) 1.7 Width: (cm) 1 Depth: (cm) 0.1 Area: (cm) 1.335 Volume: (cm) 0.134 % Reduction in Area: 77.3% % Reduction in Volume: 77.2% Epithelialization: Small (1-33%) Tunneling: No Undermining: No Wound Description Classification: Full Thickness Without Exposed Support Structures Wound Margin: Flat and Intact Exudate Amount: Medium Exudate Type: Serosanguineous Exudate Color: red, brown Foul Odor After Cleansing: No Slough/Fibrino Yes Wound Bed Granulation Amount: Small (1-33%) Exposed Structure Granulation Quality: Red Fascia Exposed: No Necrotic Amount: Large (67-100%) Fat Layer (Subcutaneous Tissue) Exposed: Yes Necrotic Quality: Adherent Slough Tendon Exposed: No Muscle Exposed: No Joint Exposed: No Bone Exposed: No Treatment Notes Wound #8 (Ankle) Wound Laterality: Left, Medial Cleanser Normal Saline Discharge Instruction: Wash your hands with soap and water. Remove old dressing, discard into plastic bag and place into trash. Cleanse the wound with Normal Saline prior to applying a clean dressing using gauze  sponges, not tissues or cotton balls. Do not scrub or use excessive force. Pat dry using gauze sponges, not tissue or cotton balls. TIBURCIO, LINDER ([id number]) Peri-Wound Care Desitin Maximum Strength Ointment 4 (oz) Discharge Instruction: Apply zinc periwound Topical Primary Dressing Hydrofera Blue Ready Transfer Foam, 2.5x2.5 (in/in) Discharge Instruction: Apply Hydrofera Blue Ready to wound bed as directed (EXTRA SENT WITH PATIENT, PLEASE USE) Secondary Dressing Kerlix 4.5 x 4.1 (in/yd) Discharge Instruction: Apply kerlix over ABD pad Secured With 67M [place] Surgical Tape, 2x2 (in/yd) Discharge Instruction: Secure kerlix Compression Wrap Compression Stockings Add-Ons Electronic Signature(s) Signed: 05/14/2021 4:32:38 PM By: Dolan Amen RN Entered By: Dolan Amen on 05/14/2021 08:21:09 Chad [name] ([id number]) -------------------------------------------------------------------------------- [place] Details Patient Name: Chad [name] Date of Service: 05/14/2021 8:15 AM Medical Record Number: [id number] Patient Account Number: [id number] Date of Birth/Sex: [birth year] (60 y.o. M) Treating RN: Dolan Amen Primary Care Zianna Dercole: SYSTEM, PCP Other Clinician: Referring Winston Sobczyk: Clayborn Bigness Treating Andreea Arca/Extender: Skipper Cliche in Treatment: 39 Vital Signs Time Taken: 08:15 Temperature (F): 97.6 Height (in): 67 Pulse (bpm): 88 Weight (lbs): 160 Respiratory Rate (breaths/min): 18 Body Mass Index (BMI): 25.1 Blood Pressure (mmHg): 129/83 Reference Range: 80 - 120 mg / dl Electronic Signature(s) Signed: 05/14/2021 4:32:38 PM By: Dolan Amen RN Entered By: Dolan Amen on 05/14/2021 08:15:23

## 2021-05-16 ENCOUNTER — Other Ambulatory Visit: Payer: Self-pay | Admitting: *Deleted

## 2021-05-16 DIAGNOSIS — R5383 Other fatigue: Secondary | ICD-10-CM

## 2021-05-17 ENCOUNTER — Other Ambulatory Visit: Payer: Self-pay | Admitting: General Surgery

## 2021-05-17 NOTE — Progress Notes (Signed)
Subjective:     Patient ID: Chad [name] is a 60 y.o. male.   HPI   The following portions of the patient's history were reviewed and updated as appropriate.   This an established patient is here today for: office visit. Patient reports he still has constipation. The patient reports bleeding with hard stools.    The patient is a resident at [facility].  The patient reports that the food is not the best, but he is maintaining his weight.      Chief Complaint  Patient presents with   Pre-op Exam      BP 122/72   Pulse 67   Temp 36.7 C (98 F)   Ht 175.3 cm (5\' 9" )   Wt 73 kg (161 lb)   SpO2 99%   BMI 23.78 kg/m        Past Medical History:  Diagnosis Date   Anemia     Anxiety     ARF (acute renal failure) (CMS-HCC)     Bladder cancer (CMS-HCC)     COPD (chronic obstructive pulmonary disease) (CMS-HCC)     COPD (chronic obstructive pulmonary disease) (CMS-HCC)     Depression     Dysphagia     Family history of colon cancer     Family history of kidney cancer     Family history of leukemia     GERD (gastroesophageal reflux disease)     Hepatitis     Hepatitis C, chronic (CMS-HCC)     History of stroke     Hydronephrosis     Hyperlipidemia     Hypertension     Malignant neoplasm of colon (CMS-HCC)     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    Dr Vicente Males   Nerve pain     Peripheral vascular disease (CMS-HCC)     Prostate cancer (CMS-HCC)     Ureteral cancer, 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   Urinary frequency     Venous hypertension of lower extremity, bilateral             Past Surgical History:  Procedure Laterality Date   ANGIOGRAPHY LOWER EXTREMITY   05/30/2019    Dr.Jason Dew, 10.26.20    ANGIOGRAPHY LOWER EXTREMITY Left 02/20/2020   ANGIOGRAPHY LOWER EXTREMITY Right 02/13/2020   colon surgery    07/2017    [place] Extended right hemicolectomy    COLONOSCOPY   11/06/2020   CYSTOSCOPY        With retrogrades right 2.3.20     CYSTOSCOPY Right 11/21/2019    with stent   CYSTOSCOPY   03/07/2019   cystoscopy with sten placement  Right 08/30/2018    Surgeon Kandis Fantasia, MD, 8.10.20,9.29.19   [place] cath insertion    02/28/2019    Dr Lucky Cowboy   tumor removed               Social History           Socioeconomic History   Marital status: Single  Tobacco Use   Smoking status: Current Every Day Smoker   Smokeless tobacco: Never Used  Substance and Sexual Activity   Alcohol use: Not Currently   Drug use: Not Currently      Comment: history of maijuana use   Sexual activity: Defer            Allergies  Allergen Reactions   Penicillins Rash      Current Medications        Current Outpatient Medications  Medication  Sig Dispense Refill   acetaminophen (TYLENOL) 325 MG tablet Take 650 mg by mouth 3 (three) times a day       aspirin 81 MG EC tablet Take 1 tablet by mouth once daily       carboxymethylcellulose sodium 1 % Drop Apply to eye 2 (two) times daily       cholecalciferol (VITAMIN D3) 2,000 unit capsule Take 2,000 Units by mouth once daily       clopidogreL (PLAVIX) 75 mg tablet Take 1 tablet by mouth once daily       cyclobenzaprine (FLEXERIL) 10 MG tablet Take 10 mg by mouth nightly       gabapentin (NEURONTIN) 100 MG capsule Take 100 mg by mouth 3 (three) times a day       gentamicin 0.1 % cream Apply topically once daily       mirtazapine (REMERON) 7.5 MG tablet Take 7.5 mg by mouth nightly       oxyCODONE (DAZIDOX) 10 mg immediate release tablet Take 10 mg by mouth 4 (four) times daily       pantoprazole (PROTONIX) 40 MG DR tablet Take 40 mg by mouth once daily       sennosides-docusate (SENOKOT-S) 8.6-50 mg tablet Take 1 tablet by mouth once daily       silver-calcium alginate 2 X 2 " Bndg Apply topically once daily       Zinc Mth/Copper/Saw Palm/Gnsg (PROSTATE HEALTH FORMULA ORAL) Take 30 mLs by mouth 2 (two) times daily       atorvastatin (LIPITOR) 10 MG tablet Take 1 tablet by mouth once  daily       collagenase (SANTYL) ointment Apply 1 Application topically once daily (Patient not taking: No sig reported)       diclofenac (VOLTAREN) 1 % topical gel Apply 2 g topically 4 (four) times daily as needed (Patient not taking: No sig reported)       escitalopram oxalate (LEXAPRO) 5 MG tablet Take 5 mg by mouth once daily (Patient not taking: No sig reported)        No current facility-administered medications for this visit.             Family History  Problem Relation Age of Onset   Prostate cancer Father     Cancer Brother     Leukemia Son     Cancer Paternal Uncle     Cancer Maternal Grandmother     Kidney cancer Paternal Grandfather          Labs and Radiology:    November 07, 2020 colonoscopy reviewed:   Description of a 25 mm polypoid lesion in the sigmoid colon granular, lateral spreading.  Image labeling states descending colon.   A. COLON MASS, SIGMOID; COLD BIOPSY:  - FOCAL MUCIN POOLS WITH CARCINOMA (MUCINOUS CARCINOMA), SEE COMMENT.  - INFLAMED GRANULATION TISSUE CONSISTENT WITH ULCERATION.  - DEEPER SECTIONS EXAMINED.    Small polyps in the rectum.  Evidence of prior right hemicolectomy.   April 30, 2021 CT of the chest abdomen and pelvis:   This study was independently reviewed.   IMPRESSION: 1. Interval development of a 13 mm lobular polypoid lesion posterior wall right mainstem bronchus. This does appear to have some trace gas in it laterally suggesting that it represents mucus/secretion. Polypoid endobronchial lesion is considered much less likely but not excluded. Follow-up CT chest in 4-6 weeks could be used to re-evaluate. 2. The irregular/ill-defined wall thickening seen previously in the right renal  pelvis and proximal ureter appears decreased in the interval with less enhancing tissue visible on today's study, but this is a subtle change. 3. Stable right double-J internal ureteral stent with similar appearance of mild right  hydronephrosis and cortical thinning in the right kidney. 4. Stable appearance of the wall thickening along the proximal sigmoid colon. This region was noted to be hypermetabolic on previous PET-CT. 5. Stable 10 mm short axis portal caval lymph node. Scattered upper normal lymph nodes seen in the pelvis bilaterally on the prior study have decreased in the interval. No pelvic lymphadenopathy by CT size criteria on today's study. 6. Aortic Atherosclerosis (ICD10-I70.0).   May 01, 2021 laboratory:  Component Ref Range & Units 2 wk ago  (05/01/21) 1 mo ago  (04/10/21) 1 mo ago  (03/20/21) 2 mo ago  (02/20/21) 3 mo ago  (01/30/21) 4 mo ago  (01/07/21) 5 mo ago  (12/17/20)  WBC 4.0 - 10.5 K/uL 7.6  6.7  6.7  7.1  7.7  6.1  8.1   RBC 4.22 - 5.81 MIL/uL 4.72  4.55  4.59  4.73  4.88  4.84  5.13   Hemoglobin 13.0 - 17.0 g/dL 11.7 Low   11.0 Low   11.1 Low   11.6 Low   12.2 Low   12.4 Low   12.7 Low    HCT 39.0 - 52.0 % 37.2 Low   35.2 Low   35.9 Low   37.4 Low   38.8 Low   38.7 Low   40.2   MCV 80.0 - 100.0 fL 78.8 Low   77.4 Low   78.2 Low   79.1 Low   79.5 Low   80.0  78.4 Low    MCH 26.0 - 34.0 pg 24.8 Low   24.2 Low   24.2 Low   24.5 Low   25.0 Low   25.6 Low   24.8 Low    MCHC 30.0 - 36.0 g/dL 31.5  31.3  30.9  31.0  31.4  32.0  31.6   RDW 11.5 - 15.5 % 17.6 High   15.4  14.6  14.9  15.7 High   17.6 High   18.9 High    Platelets 150 - 400 K/uL 185  219  271  180  216  176  196   nRBC 0.0 - 0.2 % 0.0  0.0  0.0  0.0  0.0  0.0  0.0   Neutrophils Relative % % 68  67  64  64  66  65  71   Neutro Abs 1.7 - 7.7 K/uL 5.2  4.4  4.4  4.5  5.1  3.9  5.7   Lymphocytes Relative % 18  18  19  21  19  21  17    Lymphs Abs 0.7 - 4.0 K/uL 1.4  1.2  1.3  1.5  1.5  1.3  1.4   Monocytes Relative % 7  8  9  8  8  8  7    Monocytes Absolute 0.1 - 1.0 K/uL 0.5  0.6  0.6  0.6  0.6  0.5  0.5   Eosinophils Relative % 5  6  6  6  6  5  4     Component Ref Range & Units 6 mo ago  (10/29/20) 7 mo ago  (10/08/20) 8 mo  ago  (09/17/20) 8 mo ago  (08/27/20) 9 mo ago  (08/06/20) 10 mo ago  (07/13/20) 11 mo ago  (05/31/20)  WBC 4.0 -  10.5 K/uL 7.0  7.9  6.7  10.3  9.1  9.3  9.1   RBC 4.22 - 5.81 MIL/uL 5.25  5.00  4.92  5.05  5.17  5.04  4.88   Hemoglobin 13.0 - 17.0 g/dL 12.3 Low   11.5 Low   11.4 Low   12.1 Low   12.2 Low   12.4 Low   13.1   HCT 39.0 - 52.0 % 40.2  36.7 Low   36.7 Low   37.5 Low   39.2  38.8 Low   39.7   MCV 80.0 - 100.0 fL 76.6 Low   73.4 Low   74.6 Low   74.3 Low   75.8 Low   77.0 Low   81.4   MCH 26.0 - 34.0 pg 23.4 Low   23.0 Low   23.2 Low   24.0 Low   23.6 Low   24.6 Low   26.8   MCHC 30.0 - 36.0 g/dL 30.6  31.3  31.1  32.3  31.1  32.0  33.0   RDW 11.5 - 15.5 % 19.2 High   16.3 High   15.9 High   16.0 High   15.1  14.1  13.6   Platelets 150 - 400 K/uL 222  224  200  265  255  272  287   nRBC 0.0 - 0.2 % 0.0  0.0  0.0  0.0  0.0  0.0  0.0   Neutrophils Relative % % 62  70  68  74  67  71  70   Neutro Abs 1.7 - 7.7 K/uL 4.3  5.5  4.6  7.7  6.0  6.6  6.4   Lymphocytes Relative % 23  17  19  13  19  16  16    Lymphs Abs 0.7 - 4.0 K/uL 1.6  1.3  1.3  1.4  1.8  1.5  1.4    Component Ref Range & Units 2 wk ago   Sodium 135 - 145 mmol/L 137   Potassium 3.5 - 5.1 mmol/L 4.1   Chloride 98 - 111 mmol/L 104   CO2 22 - 32 mmol/L 26   Glucose, Bld 70 - 99 mg/dL 84   Comment: Glucose reference range applies only to samples taken after fasting for at least 8 hours.  BUN 8 - 23 mg/dL 21   Creatinine, Ser 0.61 - 1.24 mg/dL 0.87   Calcium 8.9 - 10.3 mg/dL 8.5 Low    Total Protein 6.5 - 8.1 g/dL 7.7   Albumin 3.5 - 5.0 g/dL 3.7   AST 15 - 41 U/L 38   ALT 0 - 44 U/L 27   Alkaline Phosphatase 38 - 126 U/L 125   Total Bilirubin 0.3 - 1.2 mg/dL 0.5   GFR, Estimated >60 mL/min >60   Comment: (NOTE)  Calculated using the CKD-EPI Creatinine Equation (2021)   Anion gap 5 - 15 7       Review of Systems  Constitutional: Negative for chills and fever.  Respiratory: Negative for cough.           Objective:   Physical Exam Constitutional:      Appearance: Normal appearance.  Cardiovascular:     Rate and Rhythm: Normal rate and regular rhythm.     Pulses: Normal pulses.     Heart sounds: Normal heart sounds.  Pulmonary:     Effort: Pulmonary effort is normal.     Breath sounds: Normal breath sounds.  Musculoskeletal:  Cervical back: Neck supple.  Neurological:     Mental Status: He is alert and oriented to person, place, and time.  Psychiatric:        Mood and Affect: Mood normal.        Behavior: Behavior normal.           Assessment:     Candidate for repeat colonoscopy to assess disease progression of the sigmoid colon lesion.   Remarkably stable ureteropelvic carcinoma disease.   CT abnormality of the chest perhaps unrelated to a new metastatic/primary foci.    Plan:     The case has been discussed in detail with Dr. [name] from medical oncology.  We will repeat his endoscopy and make a decision at that time of whether the patient could be considered a candidate for focal resection.  His mobility issues, barely bed to chair, would make a formal left colectomy resection likely leave him with 6+/- stools per day which might present a significant hygiene problem.  It would be ideal to avoid a stoma in this gentleman.   A more limited resection may be considered.  Based on review of the CT and PET scan, it is unlikely that a simple endoscopic resection could be completed without high risk of violating the bowel serosa.  The area could be kept "in check" if it develops a polypoid component with serial "trimming". Patient to be scheduled for a colonoscopy on 06-05-21 at [facility]. Magda Paganini at [facility] has been notified of this. She is aware to call [facility] on 06-04-21 between 1 and 3 pm to get patient's arrival time- [phone]. Prescriptions and colonoscopy instructions have been faxed to [facility] to the nurse's attention as requested today  (Fax: [phone]).     This note is partially prepared by Ledell Noss, CMA acting as a scribe in the presence of Dr. Hervey Ard, MD.    The documentation recorded by the scribe accurately reflects the service I personally performed and the decisions made by me.    Sohan Bellow, MD FACS

## 2021-05-20 ENCOUNTER — Encounter: Payer: Medicaid Other | Admitting: Physician Assistant

## 2021-05-20 ENCOUNTER — Other Ambulatory Visit: Payer: Self-pay

## 2021-05-20 DIAGNOSIS — L97322 Non-pressure chronic ulcer of left ankle with fat layer exposed: Secondary | ICD-10-CM | POA: Diagnosis not present

## 2021-05-20 NOTE — Progress Notes (Addendum)
BOB, DAVERSA ([id number]) Visit Report for 05/20/2021 Chief Complaint Document Details Patient Name: Chad [name], Chad [name] Date of Service: 05/20/2021 10:00 AM Medical Record Number: [id number] Patient Account Number: [id number] Date of Birth/Sex: 19-Feb-1961 (60 y.o. M) Treating RN: Cornell Barman Primary Care Provider: SYSTEM, PCP Other Clinician: Referring Provider: Clayborn Bigness Treating Provider/Extender: Skipper Cliche in Treatment: 39 Information Obtained from: Patient Chief Complaint Left ankle and right foot and heel ulcers Electronic Signature(s) Signed: 05/20/2021 9:20:54 AM By: Worthy Keeler PA-C Entered By: Worthy Keeler on 05/20/2021 09:20:53 Chad [name] ([id number]) -------------------------------------------------------------------------------- Debridement Details Patient Name: Chad [name] Date of Service: 05/20/2021 10:00 AM Medical Record Number: [id number] Patient Account Number: [id number] Date of Birth/Sex: 08/01/1960 (60 y.o. M) Treating RN: Cornell Barman Primary Care Provider: SYSTEM, PCP Other Clinician: Referring Provider: Clayborn Bigness Treating Provider/Extender: Skipper Cliche in Treatment: 39 Debridement Performed for Wound #8 Left,Medial Ankle Assessment: Performed By: Physician Tommie Sams., PA-C Debridement Type: Debridement Severity of Tissue Pre Debridement: Fat layer exposed Level of Consciousness (Pre- Awake and Alert procedure): Pre-procedure Verification/Time Out Yes - 10:32 Taken: Total Area Debrided (L x W): 1.5 (cm) x 0.9 (cm) = 1.35 (cm) Tissue and other material Viable, Non-Viable, Slough, Subcutaneous, Slough debrided: Level: Skin/Subcutaneous Tissue Debridement Description: Excisional Instrument: Curette Bleeding: Minimum Hemostasis Achieved: Pressure Response to Treatment: Procedure was tolerated well Level of Consciousness (Post- Awake and Alert procedure): Post Debridement Measurements of Total Wound Length: (cm)  1.5 Width: (cm) 0.9 Depth: (cm) 0.2 Volume: (cm) 0.212 Character of Wound/Ulcer Post Debridement: Stable Severity of Tissue Post Debridement: Limited to breakdown of skin Post Procedure Diagnosis Same as Pre-procedure Electronic Signature(s) Signed: 05/20/2021 4:30:36 PM By: Gretta Cool, BSN, RN, CWS, Kim RN, BSN Signed: 05/20/2021 4:31:19 PM By: Worthy Keeler PA-C Entered By: Gretta Cool, BSN, RN, CWS, Kim on 05/20/2021 10:33:06 Chad [name] ([id number]) -------------------------------------------------------------------------------- HPI Details Patient Name: Chad [name] Date of Service: 05/20/2021 10:00 AM Medical Record Number: [id number] Patient Account Number: [id number] Date of Birth/Sex: [birth year] (60 y.o. M) Treating RN: Cornell Barman Primary Care Provider: SYSTEM, PCP Other Clinician: Referring Provider: Clayborn Bigness Treating Provider/Extender: Skipper Cliche in Treatment: 39 History of Present Illness HPI Description: 10/08/18 on evaluation today patient actually presents to our office for initial evaluation concerning wounds that he has of the bilateral lower extremities. 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 He also has bilateral venous stasis. He apparently has been homeless currently following discharge from the hospital apparently he has been placed at [facility] which is is a skilled nursing facility locally. Nonetheless fortunately he does not show any signs of infection at this time which is good news. In fact several of the wound actually appears to be showing some signs of improvement already in my pinion. There are a couple areas in the left leg in particular there likely gonna require some sharp debridement to help clear away some necrotic tissue and help with more sufficient healing. No fevers,  chills, nausea, or vomiting noted at this time. 10/15/18 on evaluation today patient actually appears to be doing very well in regard to his bilateral lower extremities. He's been tolerating the dressing changes without complication. Fortunately there does not appear to be any evidence of active infection at this time which is great news. Overall I'm actually very pleased with how this has progressed in just one visits time.  Readmission: 08/14/2020 upon evaluation today patient presents for re-evaluation here in our clinic. He is having issues with his left ankle region as well as his right toe and his right heel. He tells me that the toe and heel actually began as a area that was itching that he was scratching and then subsequently opened up into wounds. These may have been abscess areas I presume based on what I am seeing currently. With regard to his left ankle region he tells me this was a similar type occurrence although he does have venous stasis this very well may be more of a venous leg ulcer more than anything. Nonetheless I do believe that the patient would benefit from appropriate and aggressive wound care to try to help get things under better control here. He does have history of a stroke on the left side affecting him to some degree there that he is able to stand although he does have some residual weakness. Otherwise again the patient does have chronic venous insufficiency as previously noted. His arterial studies most recently obtained showed that he had an ABI on the right of 1.16 with a TBI of 0.52 and on the left and ABI of 1.14 with a TBI of 0.81. That was obtained on 06/19/2020. 08/28/2020 upon evaluation today patient appears to be doing decently well in regard to his wounds in general. He has been tolerating the dressing changes without complication. Fortunately there does not appear to be any signs of active infection which is great news. With that being said I think the  [facility] is doing a good job I would recommend that we likely continue with that currently. 09/11/2020 upon evaluation today patient's wounds did not appear to be doing too poorly but again he is not really showing signs of significant improvement with regard to any of the wounds on the right. None of them have Hydrofera Blue on them I am not exactly sure why this is not being followed as the facility did not contact us to let us know of any issues with obtaining dressings or otherwise. With that being said he is supposed to be using Hydrofera Blue on both of the wounds on the right foot as well as the ankle wound on the left side. 09/18/2020 upon evaluation today patient appears to be doing poorly with regard to his wounds. Again right now the left ankle in particular showed signs of extreme maceration. Apparently he was told by someone with staff at [facility] they could not get the [facility]. With that being said this is something that is never been relayed to [place] one way or another. Also the patient subsequently has not supposed to have a border gauze dressing on. He should have an ABD pad and roll gauze to secure as this drains much too much just to have a border gauze dressing to cover. Nonetheless the fact that they are not using the appropriate dressing is directly causing deterioration of the left ankle wound it is significantly worse today compared to what it was previous. I did attempt to call [facility] while the patient was here I called three times and got no one to even pick up the phone. After this I had my for an office coordinator call and she was able to finally get through and leave a message with the D ON as of dictation of this note which is roughly about an hour and a half later I still have not been able to speak  with anyone at the facility. 09/25/2020 upon evaluation today patient actually showing signs of good improvement which is excellent news. He has  been tolerating the dressing changes without complication. Fortunately there is no signs of active infection which is great news. No fevers, chills, nausea, vomiting, or diarrhea. I do feel like the facility has been doing a much better job at taking care of him as far as the dressings are concerned. However the director of nursing never did call me back. 10/09/2020 upon evaluation today patient appears to be doing well with regard to his wound. The toe ulcer did require some debridement but the other 2 areas actually appear to be doing quite well. 10/19/2020 upon evaluation today patient actually appears to be doing very well in regard to his wounds. In fact the heel does appear to be completely healed. The toe is doing better in the medial ankle on the left is also doing better. Overall I think he is headed in the right direction. 10/26/2020 upon evaluation today patient appears to be doing well with regard to his wound. He is showing signs of improvement which is great news and overall I am very pleased with where things stand today. No fevers, chills, nausea, vomiting, or diarrhea. 11/02/2020 upon evaluation today patient appears to be doing well with regard to his wounds. He has been tolerating the dressing changes without complication overall I am extremely pleased with where things stand today. He in regard to the toe is almost completely healed and the medial ankle on the left is doing much better. 11/09/2020 upon evaluation today patient appears to be doing a little poorly in regard to his left medial ankle ulcer. Fortunately there does not appear to be any signs of systemic infection but unfortunately locally he does appear to be infected in fact he has blue-green drainage consistent with Pseudomonas. FRANS, VALENTE ([id number]) 11/16/2020 upon evaluation today patient appears to be doing well with regard to his wound. It actually appears to be doing better. I did place him on gentamicin cream  since the Cipro was actually resistant even though he was positive for Pseudomonas on culture. Overall I think that he does seem to be doing better though I am unsure whether or not they have actually been putting the cream on. The patient is not sure that we did talk to the nurse directly and she was going to initiate that treatment. Fortunately there does not appear to be any signs of active infection at this time. No fevers, chills, nausea, vomiting, or diarrhea. 4/28; the area on the right second toe is close to healed. Left medial ankle required debridement 12/07/2020 upon evaluation today patient appears to be doing well with regard to his wounds. In fact the right second toe appears to be completely healed which is great news. Fortunately there does not appear to be any signs of active infection at this time which is also great news. I think we can probably discontinue the gentamicin on top of everything else. 12/14/2020 upon evaluation today patient appears to be doing well with regard to his wound. He is making good progress and overall very pleased with where things stand today. There is no signs of active infection at this time which is great news. 12/28/2020 upon evaluation today patient appears to be doing well with regard to his wounds. He has been tolerating the dressing changes without complication. Fortunately there is no signs of active infection at this time. No fevers, chills, nausea, vomiting, or  diarrhea. 12/28/2020 upon evaluation today patient's wound bed actually showed signs of excellent improvement. He has great epithelization and granulation I do not see any signs of infection overall I am extremely pleased with where things stand at this point. No fevers, chills, nausea, vomiting, or diarrhea. 01/11/2021 upon evaluation today patient appears to be doing well with regard to his wound on his leg. He has been tolerating the dressing changes without complication. Fortunately there  does not appear to be any signs of active infection which is great news. No fevers, chills, nausea, vomiting, or diarrhea. 01/25/2021 upon evaluation today patient appears to be doing well with regard to his wound. He has been tolerating the dressing changes without complication. Fortunately the collagen seems to be doing a great job which is excellent news. No fevers, chills, nausea, vomiting, or diarrhea. 02/08/2021 upon evaluation today patient's wound is actually looking a little bit worse especially in the periwound compared to previous. Fortunately there does not appear to be any signs of infection which is great news with that being said he does have some irritation around the periphery of the wound which has me more concerned. He actually had a dressing on that had not been changed in 3 days. He also is supposed to have daily dressing changes. With regard to the dressing applied he had a silver alginate dressing and silver collagen is what is recommended and ordered. He also had no Desitin around the edges of the wound in the periwound region although that is on the order inspect to be done as well. In general I was very concerned I did contact [facility] actually spoke with Magda Paganini who is the scheduling individual and subsequently she stated that she would pass the information to the D ON apparently the D ON was not available to talk to me when I call today. 02/18/2021 upon evaluation today patient's wound is actually showing signs of improvement. Fortunately there does not appear to be any evidence of infection which is great news overall I am extremely pleased with where things stand today. No fevers, chills, nausea, vomiting, or diarrhea. 8/3; patient presents for 1 week follow-up. He has no issues or complaints today. He denies signs of infection. 03/11/2021 upon evaluation today patient appears to be doing well with regard to his wound. He does have a little bit of slough noted on  the surface of the wound but fortunately there does not appear to be any signs of active infection at this time. No fevers, chills, nausea, vomiting, or diarrhea. 03/18/2021 upon evaluation today patient appears to be doing well with regard to his wound. He has been tolerating the dressing changes without complication. There was a little irritation more proximal to where the wound was that was not noted last week but nonetheless this is very superficial just seems to be more irritation we just need to make sure to put a good amount of the zinc over the area in my opinion. Otherwise he does not seem to be doing significantly worse at all which is great news. 03/25/2021 upon evaluation today patient appears to be doing well with regard to his wound. He is going require some sharp debridement today to clear with some of the necrotic debris. I did perform this today without complication postdebridement wound bed appears to be doing much better this is great news. 04/08/2021 upon evaluation today patient appears to be doing decently well in regard to his wound although the overall measurement is not significantly smaller  compared to previous. It is gone down a little bit but still the facility continues to not really put the appropriate dressings in place in fact he was supposed to have collagen we think he probably had more of an allergy to At this point. Fortunately there does not appear to be any signs of active infection systemically though locally I do not see anything on initial visualization either as far as erythema or warmth. 04/15/2021 upon evaluation today patient appears to be doing well with regard to his wound. He is actually showing signs of improvement. I did place him on antibiotics last week, Cipro. He has been taking that 2 times a day and seems to be tolerating it very well. I do not see any evidence of worsening and in fact the overall appearance of the wound is smaller today which is  also great news. 9/26; left medial ankle chronic venous insufficiency wound is improved. Using Hydrofera Blue 10/10; left medial ankle chronic venous insufficiency. Wound has not changed much in appearance completely nonviable surface. Apparently there have been problems getting the right product on the wound at the facility although he came in with [facility] on today 05/14/2021 upon evaluation today patient appears to be doing well with regard to his wound. I think he is making progress here which is good news. Fortunately there does not appear to be any signs of active infection at this time. No fevers, chills, nausea, vomiting, or diarrhea. 05/20/2021 upon evaluation today patient appears to be doing well with regard to his wound. He is showing signs of good improvement which is great news. There does not appear to be any evidence of active infection which is also excellent news. No fevers, chills, nausea, vomiting, or diarrhea. Electronic Signature(s) Signed: 05/20/2021 10:42:59 AM By: Worthy Keeler PA-C Entered By: Worthy Keeler on 05/20/2021 10:42:59 Chad [name] ([id number]) MICAI, APOLINAR ([id number]) -------------------------------------------------------------------------------- Physical Exam Details Patient Name: Chad [name] Date of Service: 05/20/2021 10:00 AM Medical Record Number: [id number] Patient Account Number: [id number] Date of Birth/Sex: May 28, 1961 (60 y.o. M) Treating RN: Cornell Barman Primary Care Provider: SYSTEM, PCP Other Clinician: Referring Provider: Clayborn Bigness Treating Provider/Extender: Skipper Cliche in Treatment: 78 Constitutional Well-nourished and well-hydrated in no acute distress. Respiratory normal breathing without difficulty. Psychiatric this patient is able to make decisions and demonstrates good insight into disease process. Alert and Oriented x 3. pleasant and cooperative. Notes Upon inspection patient's wound bed showed signs  of good granulation epithelization at this point. Fortunately I do not see any signs of active infection which is great news and I will overall I think that he is moving in the right direction. I did perform sharp debridement to clear away some of the slough and biofilm on the surface of the wound he tolerated that today without complication postdebridement wound bed appears to be doing much better. Electronic Signature(s) Signed: 05/20/2021 10:43:21 AM By: Worthy Keeler PA-C Entered By: Worthy Keeler on 05/20/2021 10:43:21 Chad [name] ([id number]) -------------------------------------------------------------------------------- Physician Orders Details Patient Name: Chad [name] Date of Service: 05/20/2021 10:00 AM Medical Record Number: [id number] Patient Account Number: [id number] Date of Birth/Sex: 1961-05-04 (60 y.o. 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 Bathing/ Shower/ Hygiene o May shower; gently cleanse wound with antibacterial soap, rinse and pat dry prior to dressing wounds Edema Control - Lymphedema / Segmental Compressive Device / Other o Elevate legs to the level of the heart and pump ankles as often as possible o Elevate leg(s) parallel to the floor when sitting. o DO YOUR BEST to sleep in the bed at night. DO NOT sleep in your recliner. Long hours of sitting in a recliner leads to swelling of the legs and/or potential wounds on your backside. Wound Treatment Wound #8 -  Ankle Wound Laterality: Left, Medial Cleanser: Normal Saline 3 x Per Week/30 Days Discharge Instructions: Wash your hands with soap and water. Remove old dressing, discard into plastic bag and place into trash. Cleanse the wound with Normal Saline prior to applying a clean dressing using gauze sponges, not tissues or cotton balls. Do not scrub or use excessive force. Pat dry using gauze sponges, not tissue or cotton balls. Peri-Wound Care: Desitin Maximum Strength Ointment 4 (oz) 3 x Per Week/30 Days Discharge Instructions: Apply zinc periwound Primary Dressing: Hydrofera Blue Ready Transfer Foam, 2.5x2.5 (in/in) 3 x Per Week/30 Days Discharge Instructions: Apply Hydrofera Blue Ready to wound bed as directed (EXTRA SENT WITH PATIENT, PLEASE USE) Secondary Dressing: Kerlix 4.5 x 4.1 (in/yd) 3 x Per Week/30 Days Discharge Instructions: Apply kerlix over ABD pad Secured With: 25M Medipore H Soft Cloth Surgical Tape, 2x2 (in/yd) 3 x Per Week/30 Days Discharge Instructions: Secure kerlix Electronic Signature(s) Signed: 05/20/2021 4:30:36 PM By: Gretta Cool, BSN, RN, CWS, Kim RN, BSN Signed: 05/20/2021 4:31:19 PM By: Worthy Keeler PA-C Entered By: Gretta Cool, BSN, RN, CWS, Kim on 05/20/2021 10:34:40 Chad [name] ([id number]) -------------------------------------------------------------------------------- Problem List Details Patient Name: Chad [name] Date of Service: 05/20/2021 10:00 AM Medical Record Number: [id number] Patient Account Number: [id number] Date of Birth/Sex: April 16, 1961 (60 y.o. 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 08/14/2020 No Yes non-dominant side Inactive Problems ICD-10 Code Description Active Date Inactive Date L97.412 Non-pressure chronic ulcer of right heel and midfoot with fat layer exposed 08/14/2020 08/14/2020 L97.512 Non-pressure chronic ulcer of other part of right foot with fat layer exposed 08/14/2020 08/14/2020 Resolved Problems Electronic Signature(s) Signed: 05/20/2021 9:20:47 AM By: Worthy Keeler PA-C Entered By: Worthy Keeler on 05/20/2021 09:20:47 Chad [name] ([id number]) -------------------------------------------------------------------------------- Progress Note Details Patient Name: Chad [name] Date of Service: 05/20/2021 10:00 AM Medical Record Number: [id number] Patient Account Number: [id number] Date of Birth/Sex: 03/19/1961 (60 y.o. M) Treating RN: Cornell Barman Primary Care Provider: SYSTEM, PCP Other Clinician: Referring Provider: Clayborn Bigness Treating Provider/Extender: Skipper Cliche in Treatment: 39 Subjective Chief Complaint Information obtained from Patient Left ankle and right foot and heel ulcers History of Present Illness (HPI) 10/08/18 on evaluation today patient actually presents to our office for initial evaluation concerning wounds that he has of the bilateral lower extremities. 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 He also has bilateral venous stasis. He  apparently has been homeless currently following discharge from the hospital apparently he has been placed at [facility] which is is a skilled nursing facility locally. Nonetheless fortunately he does not show any signs of infection at this time which is good news. In fact several of the wound actually appears to be showing some signs of  improvement already in my pinion. There are a couple areas in the left leg in particular there likely gonna require some sharp debridement to help clear away some necrotic tissue and help with more sufficient healing. No fevers, chills, nausea, or vomiting noted at this time. 10/15/18 on evaluation today patient actually appears to be doing very well in regard to his bilateral lower extremities. He's been tolerating the dressing changes without complication. Fortunately there does not appear to be any evidence of active infection at this time which is great news. Overall I'm actually very pleased with how this has progressed in just one visits time. Readmission: 08/14/2020 upon evaluation today patient presents for re-evaluation here in our clinic. He is having issues with his left ankle region as well as his right toe and his right heel. He tells me that the toe and heel actually began as a area that was itching that he was scratching and then subsequently opened up into wounds. These may have been abscess areas I presume based on what I am seeing currently. With regard to his left ankle region he tells me this was a similar type occurrence although he does have venous stasis this very well may be more of a venous leg ulcer more than anything. Nonetheless I do believe that the patient would benefit from appropriate and aggressive wound care to try to help get things under better control here. He does have history of a stroke on the left side affecting him to some degree there that he is able to stand although he does have some residual weakness. Otherwise again the patient does have chronic venous insufficiency as previously noted. His arterial studies most recently obtained showed that he had an ABI on the right of 1.16 with a TBI of 0.52 and on the left and ABI of 1.14 with a TBI of 0.81. That was obtained on 06/19/2020. 08/28/2020 upon evaluation today patient appears to be doing decently well in  regard to his wounds in general. He has been tolerating the dressing changes without complication. Fortunately there does not appear to be any signs of active infection which is great news. With that being said I think the [facility] is doing a good job I would recommend that we likely continue with that currently. 09/11/2020 upon evaluation today patient's wounds did not appear to be doing too poorly but again he is not really showing signs of significant improvement with regard to any of the wounds on the right. None of them have Hydrofera Blue on them I am not exactly sure why this is not being followed as the facility did not contact us to let us know of any issues with obtaining dressings or otherwise. With that being said he is supposed to be using Hydrofera Blue on both of the wounds on the right foot as well as the ankle wound on the left side. 09/18/2020 upon evaluation today patient appears to be doing poorly with regard to his wounds. Again right now the left ankle in particular showed signs of extreme maceration. Apparently he was told by someone with staff at [facility] they could not get the [facility].  With that being said this is something that is never been relayed to [place] one way or another. Also the patient subsequently has not supposed to have a border gauze dressing on. He should have an ABD pad and roll gauze to secure as this drains much too much just to have a border gauze dressing to cover. Nonetheless the fact that they are not using the appropriate dressing is directly causing deterioration of the left ankle wound it is significantly worse today compared to what it was previous. I did attempt to call [facility] while the patient was here I called three times and got no one to even pick up the phone. After this I had my for an office coordinator call and she was able to finally get through and leave a message with the D ON as of dictation of this note  which is roughly about an hour and a half later I still have not been able to speak with anyone at the facility. 09/25/2020 upon evaluation today patient actually showing signs of good improvement which is excellent news. He has been tolerating the dressing changes without complication. Fortunately there is no signs of active infection which is great news. No fevers, chills, nausea, vomiting, or diarrhea. I do feel like the facility has been doing a much better job at taking care of him as far as the dressings are concerned. However the director of nursing never did call me back. 10/09/2020 upon evaluation today patient appears to be doing well with regard to his wound. The toe ulcer did require some debridement but the other 2 areas actually appear to be doing quite well. 10/19/2020 upon evaluation today patient actually appears to be doing very well in regard to his wounds. In fact the heel does appear to be completely healed. The toe is doing better in the medial ankle on the left is also doing better. Overall I think he is headed in the right direction. 10/26/2020 upon evaluation today patient appears to be doing well with regard to his wound. He is showing signs of improvement which is great news and overall I am very pleased with where things stand today. No fevers, chills, nausea, vomiting, or diarrhea. 11/02/2020 upon evaluation today patient appears to be doing well with regard to his wounds. He has been tolerating the dressing changes without complication overall I am extremely pleased with where things stand today. He in regard to the toe is almost completely healed and the medial [facility] ([id number]) ankle on the left is doing much better. 11/09/2020 upon evaluation today patient appears to be doing a little poorly in regard to his left medial ankle ulcer. Fortunately there does not appear to be any signs of systemic infection but unfortunately locally he does appear to be infected in  fact he has blue-green drainage consistent with Pseudomonas. 11/16/2020 upon evaluation today patient appears to be doing well with regard to his wound. It actually appears to be doing better. I did place him on gentamicin cream since the Cipro was actually resistant even though he was positive for Pseudomonas on culture. Overall I think that he does seem to be doing better though I am unsure whether or not they have actually been putting the cream on. The patient is not sure that we did talk to the nurse directly and she was going to initiate that treatment. Fortunately there does not appear to be any signs of active infection at this time. No fevers, chills, nausea, vomiting, or  diarrhea. 4/28; the area on the right second toe is close to healed. Left medial ankle required debridement 12/07/2020 upon evaluation today patient appears to be doing well with regard to his wounds. In fact the right second toe appears to be completely healed which is great news. Fortunately there does not appear to be any signs of active infection at this time which is also great news. I think we can probably discontinue the gentamicin on top of everything else. 12/14/2020 upon evaluation today patient appears to be doing well with regard to his wound. He is making good progress and overall very pleased with where things stand today. There is no signs of active infection at this time which is great news. 12/28/2020 upon evaluation today patient appears to be doing well with regard to his wounds. He has been tolerating the dressing changes without complication. Fortunately there is no signs of active infection at this time. No fevers, chills, nausea, vomiting, or diarrhea. 12/28/2020 upon evaluation today patient's wound bed actually showed signs of excellent improvement. He has great epithelization and granulation I do not see any signs of infection overall I am extremely pleased with where things stand at this point. No  fevers, chills, nausea, vomiting, or diarrhea. 01/11/2021 upon evaluation today patient appears to be doing well with regard to his wound on his leg. He has been tolerating the dressing changes without complication. Fortunately there does not appear to be any signs of active infection which is great news. No fevers, chills, nausea, vomiting, or diarrhea. 01/25/2021 upon evaluation today patient appears to be doing well with regard to his wound. He has been tolerating the dressing changes without complication. Fortunately the collagen seems to be doing a great job which is excellent news. No fevers, chills, nausea, vomiting, or diarrhea. 02/08/2021 upon evaluation today patient's wound is actually looking a little bit worse especially in the periwound compared to previous. Fortunately there does not appear to be any signs of infection which is great news with that being said he does have some irritation around the periphery of the wound which has me more concerned. He actually had a dressing on that had not been changed in 3 days. He also is supposed to have daily dressing changes. With regard to the dressing applied he had a silver alginate dressing and silver collagen is what is recommended and ordered. He also had no Desitin around the edges of the wound in the periwound region although that is on the order inspect to be done as well. In general I was very concerned I did contact [facility] actually spoke with Magda Paganini who is the scheduling individual and subsequently she stated that she would pass the information to the D ON apparently the D ON was not available to talk to me when I call today. 02/18/2021 upon evaluation today patient's wound is actually showing signs of improvement. Fortunately there does not appear to be any evidence of infection which is great news overall I am extremely pleased with where things stand today. No fevers, chills, nausea, vomiting, or diarrhea. 8/3; patient  presents for 1 week follow-up. He has no issues or complaints today. He denies signs of infection. 03/11/2021 upon evaluation today patient appears to be doing well with regard to his wound. He does have a little bit of slough noted on the surface of the wound but fortunately there does not appear to be any signs of active infection at this time. No fevers, chills, nausea, vomiting, or diarrhea.  03/18/2021 upon evaluation today patient appears to be doing well with regard to his wound. He has been tolerating the dressing changes without complication. There was a little irritation more proximal to where the wound was that was not noted last week but nonetheless this is very superficial just seems to be more irritation we just need to make sure to put a good amount of the zinc over the area in my opinion. Otherwise he does not seem to be doing significantly worse at all which is great news. 03/25/2021 upon evaluation today patient appears to be doing well with regard to his wound. He is going require some sharp debridement today to clear with some of the necrotic debris. I did perform this today without complication postdebridement wound bed appears to be doing much better this is great news. 04/08/2021 upon evaluation today patient appears to be doing decently well in regard to his wound although the overall measurement is not significantly smaller compared to previous. It is gone down a little bit but still the facility continues to not really put the appropriate dressings in place in fact he was supposed to have collagen we think he probably had more of an allergy to At this point. Fortunately there does not appear to be any signs of active infection systemically though locally I do not see anything on initial visualization either as far as erythema or warmth. 04/15/2021 upon evaluation today patient appears to be doing well with regard to his wound. He is actually showing signs of improvement. I  did place him on antibiotics last week, Cipro. He has been taking that 2 times a day and seems to be tolerating it very well. I do not see any evidence of worsening and in fact the overall appearance of the wound is smaller today which is also great news. 9/26; left medial ankle chronic venous insufficiency wound is improved. Using Hydrofera Blue 10/10; left medial ankle chronic venous insufficiency. Wound has not changed much in appearance completely nonviable surface. Apparently there have been problems getting the right product on the wound at the facility although he came in with [facility] on today 05/14/2021 upon evaluation today patient appears to be doing well with regard to his wound. I think he is making progress here which is good news. Fortunately there does not appear to be any signs of active infection at this time. No fevers, chills, nausea, vomiting, or diarrhea. 05/20/2021 upon evaluation today patient appears to be doing well with regard to his wound. He is showing signs of good improvement which is great news. There does not appear to be any evidence of active infection which is also excellent news. No fevers, chills, nausea, vomiting, or diarrhea. BLADE, SCHEFF ([id number]) Objective Constitutional Well-nourished and well-hydrated in no acute distress. Vitals Time Taken: 10:05 AM, Height: 67 in, Weight: 160 lbs, BMI: 25.1, Temperature: 98.7 F, Pulse: 74 bpm, Respiratory Rate: 26 breaths/min, Blood Pressure: 120/82 mmHg. Respiratory normal breathing without difficulty. Psychiatric this patient is able to make decisions and demonstrates good insight into disease process. Alert and Oriented x 3. pleasant and cooperative. General Notes: Upon inspection patient's wound bed showed signs of good granulation epithelization at this point. Fortunately I do not see any signs of active infection which is great news and I will overall I think that he is moving in the right  direction. I did perform sharp debridement to clear away some of the slough and biofilm on the surface of the wound he tolerated that  today without complication postdebridement wound bed appears to be doing much better. Integumentary (Hair, Skin) Wound #8 status is Open. Original cause of wound was Gradually Appeared. The date acquired was: 07/12/2019. The wound has been in treatment 39 weeks. The wound is located on the Left,Medial Ankle. The wound measures 1.5cm length x 0.9cm width x 0.1cm depth; 1.06cm^2 area and 0.106cm^3 volume. There is Fat Layer (Subcutaneous Tissue) exposed. There is no tunneling or undermining noted. There is a medium amount of serous drainage noted. The wound margin is flat and intact. There is medium (34-66%) red granulation within the wound bed. There is a medium (34-66%) amount of necrotic tissue within the wound bed including Adherent Slough. 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 There was a Excisional Skin/Subcutaneous Tissue Debridement with a total area of 1.35 sq cm performed by Tommie Sams., PA-C. With the following instrument(s): Curette to remove Viable and Non-Viable tissue/material. Material removed includes Subcutaneous Tissue and Slough and. No specimens were taken. A time out was conducted at 10:32, prior to the start of the procedure. A Minimum amount of bleeding was controlled with Pressure. The procedure was tolerated well. Post Debridement Measurements: 1.5cm length x 0.9cm width x 0.2cm depth; 0.212cm^3 volume. Character of Wound/Ulcer Post Debridement is stable. Severity of Tissue Post  Debridement is: Limited to breakdown of skin. Post procedure Diagnosis Wound #8: Same as Pre-Procedure Plan Follow-up Appointments: Return Appointment in 1 week. ERICO, STAN ([id number]) Bathing/ Shower/ Hygiene: May shower; gently cleanse wound with antibacterial soap, rinse and pat dry prior to dressing wounds Edema Control - Lymphedema / Segmental Compressive Device / Other: Elevate legs to the level of the heart and pump ankles as often as possible Elevate leg(s) parallel to the floor when sitting. DO YOUR BEST to sleep in the bed at night. DO NOT sleep in your recliner. Long hours of sitting in a recliner leads to swelling of the legs and/or potential wounds on your backside. WOUND #8: - Ankle Wound Laterality: Left, Medial Cleanser: Normal Saline 3 x Per Week/30 Days Discharge Instructions: Wash your hands with soap and water. Remove old dressing, discard into plastic bag and place into trash. Cleanse the wound with Normal Saline prior to applying a clean dressing using gauze sponges, not tissues or cotton balls. Do not scrub or use excessive force. Pat dry using gauze sponges, not tissue or cotton balls. Peri-Wound Care: Desitin Maximum Strength Ointment 4 (oz) 3 x Per Week/30 Days Discharge Instructions: Apply zinc periwound Primary Dressing: Hydrofera Blue Ready Transfer Foam, 2.5x2.5 (in/in) 3 x Per Week/30 Days Discharge Instructions: Apply Hydrofera Blue Ready to wound bed as directed (EXTRA SENT WITH PATIENT, PLEASE USE) Secondary Dressing: Kerlix 4.5 x 4.1 (in/yd) 3 x Per Week/30 Days Discharge Instructions: Apply kerlix over ABD pad Secured With: 69M Medipore H Soft Cloth Surgical Tape, 2x2 (in/yd) 3 x Per Week/30 Days Discharge Instructions: Secure kerlix 1. Would recommend that we going to continue with the wound care measures as before and the patient is in agreement with the plan this includes the use of the [facility] which I think is doing quite well for the  patient. 2. I am also can recommend that we have the patient  continue with the roll gauze to secure in place along with an ABD pad this seems to be better than anything adhesive is that just takes to his skin. 3. Were continuing with the Desitin as well around the edges of the wound to prevent anything from breaking down. We will see patient back for reevaluation in 1 week here in the clinic. If anything worsens or changes patient will contact our office for additional recommendations. Electronic Signature(s) Signed: 05/20/2021 10:44:02 AM By: Worthy Keeler PA-C Entered By: Worthy Keeler on 05/20/2021 10:44:01 Chad [name] ([id number]) -------------------------------------------------------------------------------- SuperBill Details Patient Name: Chad [name] Date of Service: 05/20/2021 Medical Record Number: [id number] Patient Account Number: [id number] Date of Birth/Sex: May 04, 1961 (60 y.o. M) Treating RN: Cornell Barman Primary Care Provider: SYSTEM, PCP Other Clinician: Referring Provider: Clayborn Bigness Treating Provider/Extender: Skipper Cliche in Treatment: 39 Diagnosis Coding ICD-10 Codes Code Description I87.2 Venous insufficiency (chronic) (peripheral) L97.322 Non-pressure chronic ulcer of left ankle with fat layer exposed 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 side Facility Procedures CPT4 Code: 38937342 Description: 87681 - DEB SUBQ TISSUE 20 SQ CM/< Modifier: Quantity: 1 CPT4 Code: Description: ICD-10 Diagnosis Description L57.262 Non-pressure chronic ulcer of left ankle with fat layer exposed Modifier: Quantity: Physician Procedures CPT4 Code: 0355974 Description: 16384 - WC PHYS SUBQ TISS 20 SQ CM Modifier: Quantity: 1 CPT4 Code: Description: ICD-10 Diagnosis Description T36.468 Non-pressure chronic ulcer of left ankle with fat layer exposed Modifier: Quantity: Electronic Signature(s) Signed:  05/20/2021 10:44:11 AM By: Worthy Keeler PA-C Entered By: Worthy Keeler on 05/20/2021 10:44:10

## 2021-05-20 NOTE — Progress Notes (Addendum)
KEEVAN, WOLZ ([id number]) Visit Report for 05/20/2021 Arrival Information Details Patient Name: Chad [name], Chad [name] Date of Service: 05/20/2021 10:00 AM Medical Record Number: [id number] Patient Account Number: [id number] Date of Birth/Sex: 1960/12/27 (60 y.o. M) Treating RN: Cornell Barman Primary Care Dishawn Bhargava: SYSTEM, PCP Other Clinician: Referring Tabari Volkert: Clayborn Bigness Treating Dellanira Dillow/Extender: Skipper Cliche in Treatment: 39 Visit Information History Since Last Visit Added or deleted any medications: No Patient Arrived: Wheel Chair Has Dressing in Place as Prescribed: Yes Arrival Time: 10:05 Pain Present Now: No Accompanied By: self Transfer Assistance: Manual Patient Identification Verified: Yes Secondary Verification Process Completed: Yes Patient Requires Transmission-Based No Precautions: Patient Has Alerts: Yes Patient Alerts: Patient on Blood Thinner ABI right 1.16 ABI left 1.14 Electronic Signature(s) Signed: 05/20/2021 4:30:36 PM By: Gretta Cool, BSN, RN, CWS, Kim RN, BSN Entered By: Gretta Cool, BSN, RN, CWS, Kim on 05/20/2021 10:05:43 Chad [name] ([id number]) -------------------------------------------------------------------------------- Encounter Discharge Information Details Patient Name: Chad [name] Date of Service: 05/20/2021 10:00 AM Medical Record Number: [id number] Patient Account Number: [id number] Date of Birth/Sex: Dec 09, 1960 (60 y.o. M) Treating RN: Cornell Barman Primary Care Dellene Mcgroarty: SYSTEM, PCP Other Clinician: Referring Shavaun Osterloh: Clayborn Bigness Treating Lariya Kinzie/Extender: Skipper Cliche in Treatment: 39 Encounter Discharge Information Items Post Procedure Vitals Discharge Condition: Stable Temperature (F): 98.2 Ambulatory Status: Ambulatory Pulse (bpm): 74 Discharge Destination: Home Respiratory Rate (breaths/min): 16 Transportation: Private Auto Blood Pressure (mmHg): 120/82 Schedule Follow-up Appointment: Yes Clinical Summary of  Care: Electronic Signature(s) Signed: 05/20/2021 4:30:36 PM By: Gretta Cool, BSN, RN, CWS, Kim RN, BSN Entered By: Gretta Cool, BSN, RN, CWS, Kim on 05/20/2021 10:38:38 Chad [name] ([id number]) -------------------------------------------------------------------------------- Lower Extremity Assessment Details Patient Name: Chad [name] Date of Service: 05/20/2021 10:00 AM Medical Record Number: [id number] Patient Account Number: [id number] Date of Birth/Sex: November 25, 1960 (60 y.o. M) Treating RN: Cornell Barman Primary Care Aarav Burgett: SYSTEM, PCP Other Clinician: Referring Malike Foglio: Clayborn Bigness Treating Wrangler Penning/Extender: Skipper Cliche in Treatment: 39 Edema Assessment Assessed: [Left: Yes] [Right: No] Edema: [Left: N] [Right: o] Vascular Assessment Pulses: Dorsalis Pedis Palpable: [Left:Yes] Electronic Signature(s) Signed: 05/20/2021 4:30:36 PM By: Gretta Cool, BSN, RN, CWS, Kim RN, BSN Entered By: Gretta Cool, BSN, RN, CWS, Kim on 05/20/2021 10:11:49 Chad [name] ([id number]) -------------------------------------------------------------------------------- Multi Wound Chart Details Patient Name: Chad [name] Date of Service: 05/20/2021 10:00 AM Medical Record Number: [id number] Patient Account Number: [id number] Date of Birth/Sex: February 25, 1961 (60 y.o. M) Treating RN: Cornell Barman Primary Care Lougenia Morrissey: SYSTEM, PCP Other Clinician: Referring Marly Schuld: Clayborn Bigness Treating Vila Dory/Extender: Skipper Cliche in Treatment: 39 Vital Signs Height(in): 67 Pulse(bpm): 60 Weight(lbs): 160 Blood Pressure(mmHg): 120/82 Body Mass Index(BMI): 25 Temperature(F): 98.7 Respiratory Rate(breaths/min): 26 Photos: [N/A:N/A] Wound Location: Left, Medial Ankle N/A N/A Wounding Event: Gradually Appeared N/A N/A Primary Etiology: Venous Leg Ulcer N/A N/A Comorbid History: Anemia, Chronic Obstructive N/A N/A Pulmonary Disease (COPD), Hepatitis C, Received Chemotherapy Date Acquired: 07/12/2019 N/A  N/A Weeks of Treatment: 39 N/A N/A Wound Status: Open N/A N/A Measurements L x W x D (cm) 1.5x0.9x0.1 N/A N/A Area (cm) : 1.06 N/A N/A Volume (cm) : 0.106 N/A N/A % Reduction in Area: 82.00% N/A N/A % Reduction in Volume: 82.00% N/A N/A Classification: Full Thickness Without Exposed N/A N/A Support Structures Exudate Amount: Medium N/A N/A Exudate Type: Serous N/A N/A Exudate Color: amber N/A N/A Wound Margin: Flat and Intact N/A N/A Granulation Amount: Medium (34-66%) N/A N/A Granulation Quality: Red N/A N/A Necrotic Amount: Medium (34-66%) N/A N/A Exposed Structures: Fat Layer (Subcutaneous Tissue): N/A N/A Yes Fascia: No Tendon: No Muscle: No Joint: No Bone:  No Epithelialization: None N/A N/A Treatment Notes Electronic Signature(s) Signed: 05/20/2021 4:30:36 PM By: Gretta Cool, BSN, RN, CWS, Kim RN, BSN Entered By: Gretta Cool, BSN, RN, CWS, Kim on 05/20/2021 10:13:34 Chad [name] ([id number]) -------------------------------------------------------------------------------- Multi-Disciplinary Care Plan Details Patient Name: Chad [name] Date of Service: 05/20/2021 10:00 AM Medical Record Number: [id number] Patient Account Number: [id number] Date of Birth/Sex: Apr 13, 1961 (60 y.o. M) Treating RN: Cornell Barman Primary Care Jewelianna Pancoast: SYSTEM, PCP Other Clinician: Referring Takesha Steger: Clayborn Bigness Treating Corbitt Cloke/Extender: Skipper Cliche in Treatment: 39 Active Inactive Necrotic Tissue Nursing Diagnoses: Impaired tissue integrity related to necrotic/devitalized tissue Knowledge deficit related to management of necrotic/devitalized tissue Goals: Necrotic/devitalized tissue will be minimized in the wound bed Date Initiated: 05/20/2021 Target Resolution Date: 05/20/2021 Goal Status: Active Patient/caregiver will verbalize understanding of reason and process for debridement of necrotic tissue Date Initiated: 05/20/2021 Target Resolution Date: 05/20/2021 Goal Status:  Active Interventions: Assess patient pain level pre-, during and post procedure and prior to discharge Provide education on necrotic tissue and debridement process Treatment Activities: Apply topical anesthetic as ordered : 05/20/2021 Notes: Electronic Signature(s) Signed: 05/20/2021 4:30:36 PM By: Gretta Cool, BSN, RN, CWS, Kim RN, BSN Entered By: Gretta Cool, BSN, RN, CWS, Kim on 05/20/2021 10:13:26 Chad [name] ([id number]) -------------------------------------------------------------------------------- Pain Assessment Details Patient Name: Chad [name] Date of Service: 05/20/2021 10:00 AM Medical Record Number: [id number] Patient Account Number: [id number] Date of Birth/Sex: 02-01-1961 (60 y.o. M) Treating RN: Cornell Barman Primary Care Ayeisha Lindenberger: SYSTEM, PCP Other Clinician: Referring Nehemiah Mcfarren: Clayborn Bigness Treating Stiven Kaspar/Extender: Skipper Cliche in Treatment: 39 Active Problems Location of Pain Severity and Description of Pain Patient Has Paino No Site Locations Pain Management and Medication Current Pain Management: Notes Patient denies pain at this time. Electronic Signature(s) Signed: 05/20/2021 4:30:36 PM By: Gretta Cool, BSN, RN, CWS, Kim RN, BSN Entered By: Gretta Cool, BSN, RN, CWS, Kim on 05/20/2021 10:07:18 Chad [name] ([id number]) -------------------------------------------------------------------------------- Patient/Caregiver Education Details Patient Name: Chad [name] Date of Service: 05/20/2021 10:00 AM Medical Record Number: [id number] Patient Account Number: [id number] Date of Birth/Gender: 09/20/1960 (60 y.o. M) Treating RN: Cornell Barman Primary Care Physician: SYSTEM, PCP Other Clinician: Referring Physician: Clayborn Bigness Treating Physician/Extender: Skipper Cliche in Treatment: 39 Education Assessment Education Provided To: Patient Education Topics Provided Wound Debridement: Handouts: Wound Debridement Methods: Demonstration, Explain/Verbal Responses:  State content correctly Electronic Signature(s) Signed: 05/20/2021 4:30:36 PM By: Gretta Cool, BSN, RN, CWS, Kim RN, BSN Entered By: Gretta Cool, BSN, RN, CWS, Kim on 05/20/2021 10:34:55 Chad [name] ([id number]) -------------------------------------------------------------------------------- Wound Assessment Details Patient Name: Chad [name] Date of Service: 05/20/2021 10:00 AM Medical Record Number: [id number] Patient Account Number: [id number] Date of Birth/Sex: Jul 06, 1961 (60 y.o. M) Treating RN: Cornell Barman Primary Care Kiptyn Rafuse: SYSTEM, PCP Other Clinician: Referring Yuliet Needs: Clayborn Bigness Treating Amely Voorheis/Extender: Skipper Cliche in Treatment: 39 Wound Status Wound Number: 8 Primary Venous Leg Ulcer Etiology: Wound Location: Left, Medial Ankle Wound Open Wounding Event: Gradually Appeared Status: Date Acquired: 07/12/2019 Comorbid Anemia, Chronic Obstructive Pulmonary Disease (COPD), Weeks Of Treatment: 39 History: Hepatitis C, Received Chemotherapy Clustered Wound: No Photos Wound Measurements Length: (cm) 1.5 Width: (cm) 0.9 Depth: (cm) 0.1 Area: (cm) 1.06 Volume: (cm) 0.106 % Reduction in Area: 82% % Reduction in Volume: 82% Epithelialization: None Tunneling: No Undermining: No Wound Description Classification: Full Thickness Without Exposed Support Structures Wound Margin: Flat and Intact Exudate Amount: Medium Exudate Type: Serous Exudate Color: amber Foul Odor After Cleansing: No Slough/Fibrino Yes Wound Bed Granulation Amount: Medium (34-66%) Exposed Structure Granulation Quality: Red Fascia Exposed: No Necrotic Amount: Medium (34-66%) Fat  Layer (Subcutaneous Tissue) Exposed: Yes Necrotic Quality: Adherent Slough Tendon Exposed: No Muscle Exposed: No Joint Exposed: No Bone Exposed: No Treatment Notes Wound #8 (Ankle) Wound Laterality: Left, Medial Cleanser Normal Saline Discharge Instruction: Wash your hands with soap and water. Remove old  dressing, discard into plastic bag and place into trash. Cleanse the wound with Normal Saline prior to applying a clean dressing using gauze sponges, not tissues or cotton balls. Do not scrub or use excessive force. Pat dry using gauze sponges, not tissue or cotton balls. Chad [name], Chad [name] ([id number]) Peri-Wound Care Desitin Maximum Strength Ointment 4 (oz) Discharge Instruction: Apply zinc periwound Topical Primary Dressing Hydrofera Blue Ready Transfer Foam, 2.5x2.5 (in/in) Discharge Instruction: Apply Hydrofera Blue Ready to wound bed as directed (EXTRA SENT WITH PATIENT, PLEASE USE) Secondary Dressing Kerlix 4.5 x 4.1 (in/yd) Discharge Instruction: Apply kerlix over ABD pad Secured With 42M [place] [employer] Tape, 2x2 (in/yd) Discharge Instruction: Secure kerlix Compression Wrap Compression Stockings Add-Ons Electronic Signature(s) Signed: 05/20/2021 4:30:36 PM By: Gretta Cool, BSN, RN, CWS, Kim RN, BSN Entered By: Gretta Cool, BSN, RN, CWS, Kim on 05/20/2021 10:11:28 Chad [name] ([id number]) -------------------------------------------------------------------------------- [place] Details Patient Name: Chad [name] Date of Service: 05/20/2021 10:00 AM Medical Record Number: [id number] Patient Account Number: [id number] Date of Birth/Sex: Jun 29, 1961 (60 y.o. M) Treating RN: Cornell Barman Primary Care Christopherjohn Schiele: SYSTEM, PCP Other Clinician: Referring Maizee Reinhold: Clayborn Bigness Treating Miray Mancino/Extender: Skipper Cliche in Treatment: 39 Vital Signs Time Taken: 10:05 Temperature (F): 98.7 Height (in): 67 Pulse (bpm): 74 Weight (lbs): 160 Respiratory Rate (breaths/min): 26 Body Mass Index (BMI): 25.1 Blood Pressure (mmHg): 120/82 Reference Range: 80 - 120 mg / dl Electronic Signature(s) Signed: 05/20/2021 4:30:36 PM By: Gretta Cool, BSN, RN, CWS, Kim RN, BSN Entered By: Gretta Cool, BSN, RN, CWS, Kim on 05/20/2021 10:06:06

## 2021-05-22 ENCOUNTER — Inpatient Hospital Stay: Payer: Medicaid Other

## 2021-05-22 ENCOUNTER — Other Ambulatory Visit: Payer: Self-pay

## 2021-05-22 ENCOUNTER — Encounter: Payer: Self-pay | Admitting: Internal Medicine

## 2021-05-22 ENCOUNTER — Inpatient Hospital Stay (HOSPITAL_BASED_OUTPATIENT_CLINIC_OR_DEPARTMENT_OTHER): Payer: Medicaid Other | Admitting: Internal Medicine

## 2021-05-22 ENCOUNTER — Other Ambulatory Visit: Payer: Self-pay | Admitting: Internal Medicine

## 2021-05-22 DIAGNOSIS — C182 Malignant neoplasm of ascending colon: Secondary | ICD-10-CM

## 2021-05-22 DIAGNOSIS — R5383 Other fatigue: Secondary | ICD-10-CM

## 2021-05-22 DIAGNOSIS — C661 Malignant neoplasm of right ureter: Secondary | ICD-10-CM | POA: Diagnosis not present

## 2021-05-22 DIAGNOSIS — Z1509 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: Secondary | ICD-10-CM

## 2021-05-22 DIAGNOSIS — Z5112 Encounter for antineoplastic immunotherapy: Secondary | ICD-10-CM | POA: Diagnosis not present

## 2021-05-22 DIAGNOSIS — Z95828 Presence of other vascular implants and grafts: Secondary | ICD-10-CM

## 2021-05-22 DIAGNOSIS — Z7189 Other specified counseling: Secondary | ICD-10-CM

## 2021-05-22 DIAGNOSIS — Z515 Encounter for palliative care: Secondary | ICD-10-CM

## 2021-05-22 LAB — TSH: TSH: 2.433 u[IU]/mL (ref 0.350–4.500)

## 2021-05-22 LAB — CBC WITH DIFFERENTIAL/PLATELET
Abs Immature Granulocytes: 0.02 10*3/uL (ref 0.00–0.07)
Basophils Absolute: 0.1 10*3/uL (ref 0.0–0.1)
Basophils Relative: 1 %
Eosinophils Absolute: 0.3 10*3/uL (ref 0.0–0.5)
Eosinophils Relative: 4 %
HCT: 39.4 % (ref 39.0–52.0)
Hemoglobin: 12.2 g/dL — ABNORMAL LOW (ref 13.0–17.0)
Immature Granulocytes: 0 %
Lymphocytes Relative: 20 %
Lymphs Abs: 1.5 10*3/uL (ref 0.7–4.0)
MCH: 25 pg — ABNORMAL LOW (ref 26.0–34.0)
MCHC: 31 g/dL (ref 30.0–36.0)
MCV: 80.7 fL (ref 80.0–100.0)
Monocytes Absolute: 0.6 10*3/uL (ref 0.1–1.0)
Monocytes Relative: 8 %
Neutro Abs: 4.9 10*3/uL (ref 1.7–7.7)
Neutrophils Relative %: 67 %
Platelets: 214 10*3/uL (ref 150–400)
RBC: 4.88 MIL/uL (ref 4.22–5.81)
RDW: 18.4 % — ABNORMAL HIGH (ref 11.5–15.5)
WBC: 7.3 10*3/uL (ref 4.0–10.5)
nRBC: 0 % (ref 0.0–0.2)

## 2021-05-22 LAB — COMPREHENSIVE METABOLIC PANEL
ALT: 21 U/L (ref 0–44)
AST: 32 U/L (ref 15–41)
Albumin: 3.8 g/dL (ref 3.5–5.0)
Alkaline Phosphatase: 111 U/L (ref 38–126)
Anion gap: 6 (ref 5–15)
BUN: 22 mg/dL (ref 8–23)
CO2: 23 mmol/L (ref 22–32)
Calcium: 8.4 mg/dL — ABNORMAL LOW (ref 8.9–10.3)
Chloride: 105 mmol/L (ref 98–111)
Creatinine, Ser: 0.87 mg/dL (ref 0.61–1.24)
GFR, Estimated: >60 mL/min (ref >60)
Glucose, Bld: 82 mg/dL (ref 70–99)
Potassium: 4 mmol/L (ref 3.5–5.1)
Sodium: 134 mmol/L — ABNORMAL LOW (ref 135–145)
Total Bilirubin: 0.2 mg/dL — ABNORMAL LOW (ref 0.3–1.2)
Total Protein: 7.6 g/dL (ref 6.5–8.1)

## 2021-05-22 MED ORDER — SODIUM CHLORIDE 0.9 % IV SOLN
Freq: Once | INTRAVENOUS | Status: AC
  Filled 2021-05-22: qty 250

## 2021-05-22 MED ORDER — SODIUM CHLORIDE 0.9% FLUSH
10.0000 mL | Freq: Once | INTRAVENOUS | Status: AC
  Administered 2021-05-22: 10 mL via INTRAVENOUS
  Filled 2021-05-22: qty 10

## 2021-05-22 MED ORDER — SODIUM CHLORIDE 0.9 % IV SOLN
200.0000 mg | Freq: Once | INTRAVENOUS | Status: AC
  Administered 2021-05-22: 200 mg via INTRAVENOUS
  Filled 2021-05-22: qty 8

## 2021-05-22 MED ORDER — HEPARIN SOD (PORK) LOCK FLUSH 100 UNIT/ML IV SOLN
500.0000 [IU] | Freq: Once | INTRAVENOUS | Status: AC
  Filled 2021-05-22: qty 5

## 2021-05-22 MED ORDER — HEPARIN SOD (PORK) LOCK FLUSH 100 UNIT/ML IV SOLN
INTRAVENOUS | Status: AC
  Administered 2021-05-22: 500 [IU] via INTRAVENOUS
  Filled 2021-05-22: qty 5

## 2021-05-22 NOTE — Progress Notes (Signed)
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  Is alone/in a wheelchair.  Chad [name] 60 y.o.  male above history of stage IV-ureteral cancer/history of stage III colon cancer right side; recurrent sigmoid colon cancer [un-resected/under surveillance] and multiple other comorbidities currently on Keytruda is here for follow-up.  Interim s/p evaluation with Dr.Byrnett.   No nausea no vomiting.  No abdominal pain.  No blood in urine.  Chronic shortness of breath.   Review of Systems  Constitutional:  Positive for malaise/fatigue. Negative for chills, diaphoresis, fever and weight loss.  HENT:  Negative for  nosebleeds and sore throat.   Eyes:  Negative for double vision.  Respiratory:  Positive for cough and shortness of breath. Negative for hemoptysis and wheezing.   Cardiovascular:  Negative for chest pain, palpitations and orthopnea.  Gastrointestinal:  Positive for constipation. Negative for abdominal pain, blood in stool, diarrhea, heartburn, melena, nausea and vomiting.  Genitourinary:  Negative for dysuria, frequency and urgency.  Musculoskeletal:  Positive for back pain and joint pain.  Skin: Negative.  Negative for itching and rash.  Neurological:  Positive for focal weakness. Negative for dizziness, tingling, weakness and headaches.       Chronic left-sided weakness upper than lower extremity.  Endo/Heme/Allergies:  Does not bruise/bleed easily.  Psychiatric/Behavioral:  Negative for depression. The patient is not nervous/anxious and does not have insomnia.     MEDICAL HISTORY:  Past Medical History:  Diagnosis Date   Anemia    Anxiety    ARF (acute respiratory failure) (HCC)    Bladder cancer (HCC)    COPD (chronic obstructive pulmonary disease) ([place])    Depression    Dysphagia    Family history of colon cancer    Family history of kidney cancer    Family history of leukemia    Family history of prostate cancer    GERD (gastroesophageal reflux disease)    Hepatitis    chronic hep c   Hydronephrosis    Hydronephrosis with ureteral stricture    Hyperlipidemia    Knee pain    Left   Malignant neoplasm of colon ([name])    Nerve pain    Peripheral vascular disease ([place])    Prostate cancer ([place])    Stroke ([name])  Urinary frequency    Venous hypertension of both lower extremities     SURGICAL HISTORY: Past Surgical History:  Procedure Laterality Date   COLON SURGERY     En bloc extended right hemicolectomy 07/2017   COLONOSCOPY WITH PROPOFOL N/A 11/06/2020   Procedure: COLONOSCOPY WITH PROPOFOL;  Surgeon: Jonathon Bellows, MD;  Location: [facility] END[facility]COPY;  Service:  Gastroenterology;  Laterality: N/A;   CYSTOSCOPY W/ RETROGRADES Right 08/30/2018   Procedure: CYSTOSCOPY WITH RETROGRADE PYELOGRAM;  Surgeon: Hollice Espy, MD;  Location: [facility] ORS;  Service: Urology;  Laterality: Right;   CYSTOSCOPY WITH STENT PLACEMENT Right 04/25/2018   Procedure: CYSTOSCOPY WITH STENT PLACEMENT;  Surgeon: Hollice Espy, MD;  Location: [facility] OR[facility];  Service: Urology;  Laterality: Right;   CYSTOSCOPY WITH STENT PLACEMENT Right 08/30/2018   Procedure: [place] [facility];  Surgeon: Hollice Espy, MD;  Location: [facility] OR[facility];  Service: Urology;  Laterality: Right;   CYSTOSCOPY WITH STENT PLACEMENT Right 03/07/2019   Procedure: CYSTOSCOPY WITH STENT [place];  Surgeon: Hollice Espy, MD;  Location: [facility];  Service: Urology;  Laterality: Right;   CYSTOSCOPY WITH STENT PLACEMENT Right 11/21/2019   Procedure: CYSTOSCOPY WITH STENT [place];  Surgeon: Hollice Espy, MD;  Location: [facility];  Service: Urology;  Laterality: Right;   LOWER EXTREMITY ANGIOGRAPHY Left 05/23/2019   Procedure: LOWER EXTREMITY ANGIOGRAPHY;  Surgeon: Algernon Huxley, MD;  Location: [facility];  Service: Cardiovascular;  Laterality: Left;   LOWER EXTREMITY ANGIOGRAPHY Right 05/30/2019   Procedure: LOWER EXTREMITY ANGIOGRAPHY;  Surgeon: Algernon Huxley, MD;  Location: [place] [facility];  Service: Cardiovascular;  Laterality: Right;   LOWER EXTREMITY ANGIOGRAPHY Right 02/13/2020   Procedure: LOWER EXTREMITY ANGIOGRAPHY;  Surgeon: Algernon Huxley, MD;  Location: [place] [facility];  Service: Cardiovascular;  Laterality: Right;   LOWER EXTREMITY ANGIOGRAPHY Left 02/20/2020   Procedure: LOWER EXTREMITY ANGIOGRAPHY;  Surgeon: Algernon Huxley, MD;  Location: [facility] [facility];  Service: Cardiovascular;  Laterality: Left;   PORTA CATH INSERTION N/A 02/28/2019   Procedure: PORTA CATH INSERTION;  Surgeon: Algernon Huxley, MD;  Location: [facility];  Service: Cardiovascular;  Laterality: N/A;   tumor  removed       SOCIAL HISTORY: Social History   Socioeconomic History   Marital status: Single    Spouse name: Not on file   Number of children: Not on file   Years of education: Not on file   Highest education level: Not on file  Occupational History   Not on file  Tobacco Use   Smoking status: Every Day    Packs/day: 1.00    Types: Cigarettes   Smokeless tobacco: Never  Vaping Use   Vaping Use: Never used  Substance and Sexual Activity   Alcohol use: Not Currently   Drug use: Not Currently   Sexual activity: Not Currently  Other Topics Concern   Not on file  Social History Narrative    used to live [place]; moved  To [place]- end of April 2019; in Nursing home; 1pp/day; quit alcohol. 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        Family- dad- prostate ca [at 61y]; brother- 56 died of prostate cancer; brother- 62- no cancers [[place]]; sonGerald Stabs [[place]];[name]-32y prostate ca [facility] [place]]; daughter- 78 [[place]]; another daughter 65 [[place]/addict]. will refer genetics counseling. Given MSI- abnormal; highly suspicious of Lynch syndrome.  Patient's son [name] aware of high possible lynch syndrome.   Social Determinants of Health  Financial Resource Strain: Not on file  Food Insecurity: Not on file  Transportation Needs: Not on file  Physical Activity: Not on file  Stress: Not on file  Social Connections: Not on file  Intimate Partner Violence: Not on file    FAMILY HISTORY: Family History  Problem Relation Age of Onset   Prostate cancer Father 84   Cancer Brother 24       unsure type   Cancer Paternal Uncle        unsure type   Cancer Maternal Grandmother        unsure type   Cancer Paternal Grandmother        unsure type   Kidney cancer Paternal Grandfather    Cancer Other        unsure types   Leukemia Son    Cancer Son        other cancers, possibly colon    ALLERGIES:  is allergic to penicillins.  MEDICATIONS:  Current Outpatient Medications  Medication Sig  Dispense Refill   acetaminophen (TYLENOL) 325 MG tablet Take 650 mg by mouth 3 (three) times daily.      aspirin EC 81 MG tablet Take 1 tablet (81 mg total) by mouth daily. 150 tablet 2   carboxymethylcellulose 1 % ophthalmic solution Apply 1 drop to eye 2 (two) times daily.     Cholecalciferol (VITAMIN D) 50 MCG (2000 UT) CAPS Take by mouth.     Cholecalciferol 50 MCG (2000 UT) CAPS SMARTSIG:1 Tablet(s) By Mouth Daily     clopidogrel (PLAVIX) 75 MG tablet Take 1 tablet (75 mg total) by mouth daily. 30 tablet 11   cyclobenzaprine (FLEXERIL) 10 MG tablet Take 10 mg by mouth at bedtime.     escitalopram (LEXAPRO) 5 MG tablet Take 5 mg by mouth daily.     gabapentin (NEURONTIN) 100 MG capsule Take 100 mg by mouth 3 (three) times daily.     mirtazapine (REMERON) 7.5 MG tablet Take 7.5 mg by mouth at bedtime.     Oxycodone HCl 10 MG TABS Take 10 mg by mouth every 6 (six) hours as needed for pain.     pantoprazole (PROTONIX) 40 MG tablet Take 40 mg by mouth daily.     RESTORE CALCIUM ALGINATE EX Apply topically. Note: Dressing located on left ankle. Facility cleaning wound "every day shift and applying wound cleanser and appication of calcium Alginate and cover with dry dressing and wrapped with Kerlix"     senna-docusate (SENOKOT-S) 8.6-50 MG tablet Take 1 tablet by mouth daily.     atorvastatin (LIPITOR) 10 MG tablet Take 1 tablet (10 mg total) by mouth daily. 30 tablet 11   No current facility-administered medications for this visit.   Facility-Administered Medications Ordered in Other Visits  Medication Dose Route Frequency Provider Last Rate Last Admin   heparin lock flush 100 unit/mL  500 Units Intravenous Once Charlaine Dalton R, MD          .  PHYSICAL EXAMINATION: ECOG PERFORMANCE STATUS: 1 - Symptomatic but completely ambulatory  Vitals:   05/22/21 0855  BP: 121/81  Pulse: 70  Resp: 18  Temp: 97.8 F (36.6 C)  SpO2: 96%   Filed Weights   05/22/21 0855  Weight: 159 lb  11.2 oz (72.4 kg)    Physical Exam HENT:     Head: Normocephalic and atraumatic.     Mouth/Throat:     Pharynx: No oropharyngeal exudate.  Eyes:     Pupils: Pupils are  equal, round, and reactive to light.  Cardiovascular:     Rate and Rhythm: Normal rate and regular rhythm.  Pulmonary:     Effort: No respiratory distress.     Breath sounds: No wheezing.  Abdominal:     General: Bowel sounds are normal. There is no distension.     Palpations: Abdomen is soft. There is no mass.     Tenderness: There is no abdominal tenderness. There is no guarding or rebound.  Musculoskeletal:        General: No tenderness. Normal range of motion.     Cervical back: Normal range of motion and neck supple.  Skin:    General: Skin is warm.     Comments: Bilateral lower extremity ulcerations noted.  Pulses intact bilaterally.  Cystlike lesion noted right elbow-nontender.  No erythema noted.  Neurological:     Mental Status: He is oriented to person, place, and time.     Comments: Sleepy but easily arousable.  Chronic weakness left upper and lower extremity.  Psychiatric:        Mood and Affect: Affect normal.     LABORATORY DATA:  I have reviewed the data as listed Lab Results  Component Value Date   WBC 7.3 05/22/2021   HGB 12.2 (L) 05/22/2021   HCT 39.4 05/22/2021   MCV 80.7 05/22/2021   PLT 214 05/22/2021   Recent Labs    03/20/21 0757 04/10/21 0811 04/30/21 0940 05/01/21 0819  NA 134* 135  --  137  K 4.3 4.3  --  4.1  CL 102 104  --  104  CO2 25 24  --  26  GLUCOSE 95 102*  --  84  BUN 23 20  --  21  CREATININE 0.96 0.90 1.00 0.87  CALCIUM 8.5* 8.6*  --  8.5*  GFRNONAA >60 >60  --  >60  PROT 8.3* 8.1  --  7.7  ALBUMIN 3.7 3.7  --  3.7  AST 30 39  --  38  ALT 17 25  --  27  ALKPHOS 109 121  --  125  BILITOT 0.4 0.4  --  0.5    RADIOGRAPHIC STUDIES: I have personally reviewed the radiological images as listed and agreed with the findings in the report. CT CHEST  ABDOMEN PELVIS W CONTRAST  Result Date: 05/01/2021 CLINICAL DATA:  Stage IV ureteral cancer. History of stage III right colon cancer with recurrent sigmoid colon cancer EXAM: CT CHEST, ABDOMEN, AND PELVIS WITH CONTRAST TECHNIQUE: Multidetector CT imaging of the chest, abdomen and pelvis was performed following the standard protocol during bolus administration of intravenous contrast. CONTRAST:  22m OMNIPAQUE IOHEXOL 350 MG/ML SOLN COMPARISON:  PET-CT 08/30/2020. Chest abdomen pelvis CT 08/01/2020. FINDINGS: CT CHEST FINDINGS Cardiovascular: The heart size is normal. No substantial pericardial effusion. Coronary artery calcification is evident. Right Port-A-Cath tip is positioned in the upper right atrium. Mediastinum/Nodes: No mediastinal lymphadenopathy. There is no hilar lymphadenopathy. The esophagus has normal imaging features. Prominent 8 mm short axis right axillary node seen to be hypermetabolic on previous PET-CT is stable in size. There is no left axillary lymphadenopathy. Lungs/Pleura: 13 mm lobular polypoid lesion noted posterior wall right mainstem bronchus, new in the interval. This does appear to have some trace gas in at laterally suggesting that it represents mucus/secretion. Calcified granuloma noted right upper lobe on 34/4. No new suspicious pulmonary nodule or mass. No focal airspace consolidation. No pleural effusion. Musculoskeletal: No worrisome lytic or sclerotic osseous abnormality. CT  ABDOMEN PELVIS FINDINGS Hepatobiliary: No suspicious focal abnormality within the liver parenchyma. There is no evidence for gallstones, gallbladder wall thickening, or pericholecystic fluid. No intrahepatic or extrahepatic biliary dilation. Pancreas: No focal mass lesion. No dilatation of the main duct. No intraparenchymal cyst. No peripancreatic edema. Spleen: No splenomegaly. No focal mass lesion. Adrenals/Urinary Tract: No adrenal nodule or mass. Similar appearance of right hydronephrosis with cortical  thinning in the right kidney. The irregular/ill-defined wall thickening seen previously in the right renal pelvis and proximal ureter appears decreased in the interval with less enhancing tissue visible on today's study, but this is a subtle change. Stable right double-J internal ureteral stent. The soft tissue fullness seen around the distal right ureter on the previous study is less evident today. Small cyst lower pole left kidney is similar to prior. Left ureter unremarkable. Mild bladder wall thickening may be related to underdistention. Stomach/Bowel: Stomach is unremarkable. No gastric wall thickening. No evidence of outlet obstruction. Duodenum is normally positioned as is the ligament of Treitz. No small bowel wall thickening. No small bowel dilatation. Status post right hemicolectomy previously identified wall thickening noted along the proximal sigmoid colon is not substantially changed (105/2). This region was noted to be hypermetabolic on previous PET-CT. Vascular/Lymphatic: There is moderate atherosclerotic calcification of the abdominal aorta without aneurysm. 10 mm short axis portal caval node on image 60/2 is stable since CT of 08/01/2020. Small lymph nodes seen along both pelvic sidewalls are decreased since prior CT. Index external iliac node measuring 6 mm on image 100/2 today was 9 mm previously (remeasured). 5 mm external iliac node on 108/2 today was 7 mm (remeasured) previously 5 mm short axis left external iliac node on 103/2 has decreased from 8 mm previously (remeasured). Reproductive: The prostate gland and seminal vesicles are unremarkable. Other: No intraperitoneal free fluid. Musculoskeletal: No worrisome lytic or sclerotic osseous abnormality. IMPRESSION: 1. Interval development of a 13 mm lobular polypoid lesion posterior wall right mainstem bronchus. This does appear to have some trace gas in it laterally suggesting that it represents mucus/secretion. Polypoid endobronchial lesion is  considered much less likely but not excluded. Follow-up CT chest in 4-6 weeks could be used to re-evaluate. 2. The irregular/ill-defined wall thickening seen previously in the right renal pelvis and proximal ureter appears decreased in the interval with less enhancing tissue visible on today's study, but this is a subtle change. 3. Stable right double-J internal ureteral stent with similar appearance of mild right hydronephrosis and cortical thinning in the right kidney. 4. Stable appearance of the wall thickening along the proximal sigmoid colon. This region was noted to be hypermetabolic on previous PET-CT. 5. Stable 10 mm short axis portal caval lymph node. Scattered upper normal lymph nodes seen in the pelvis bilaterally on the prior study have decreased in the interval. No pelvic lymphadenopathy by CT size criteria on today's study. 6. Aortic Atherosclerosis (ICD10-I70.0). Electronically Signed   By: Misty Stanley M.D.   On: 05/01/2021 07:57    ASSESSMENT & PLAN:   Ureteral cancer, right ([place]) # High-grade urothelial cancer/cytology; likely of the right renal pelvis /upper ureter. On keytruda-  OCT 4th, 2022-  The irregular/ill-defined wall thickening seen previously in the right renal pelvis and proximal ureter appears decreased in the interval with less enhancing tissue visible on today's study, but this is a subtle change. Stable 10 mm short axis portal caval lymph node.  # Proceed with  [place] today; Labs today reviewed; Labs today reviewed;  acceptable  for treatment today.  We will repeat imaging prior to next visit.  # Incidental on CT OCT 10th, 2022-Interval development of a 13 mm lobular polypoid lesion right mainstem- mucus/secretion Vs true lesion- Follow-up CT chest in 6 weeks.   #Descending colon mass- PET scan- FEB 2022-uptake in the cecum;  [history of Lynch syndrome]-colonoscopy 25 mm polyp; biopsy mucinous carcinoma- given risk vs benefits -hold surgery for now; re-eval with  colonoscopy in NOV  6th [discussed with dr.Byrnett]. Stable appearance of the wall thickening along the proximal sigmoid colon.   # Iron deficiency anemia-hemoglobin 11-12; February 2022 iron studies/ferritin-LOW; hold Venofer with infusions- STABLE  # Bilateral LE ulcers-s/p wound care evaluation; s/p PTCA with Dr.Dew. bil Arterial Dopp-wnl- STABLE.   # Right elbow bursitis:s/p steroid injection.  No contraindications from oncology standpoint.-STABLE  #DISPOSITION: #  Keytruda;  # follow up in 3 weeks-;MD labs-cbc/cmp;  Keytruda; ;Dr.B.   All questions were answered. The patient knows to call the clinic with any problems, questions or concerns.    Cammie Sickle, MD 05/22/2021 9:30 AM

## 2021-05-22 NOTE — Patient Instructions (Signed)
[facility]  Discharge Instructions: Thank you for choosing [facility] to provide your oncology and hematology care.  If you have a lab appointment with the Cancer Center, please go directly to the Cancer Center and check in at the registration area.  Wear comfortable clothing and clothing appropriate for easy access to any Portacath or PICC line.   We strive to give you quality time with your provider. You may need to reschedule your appointment if you arrive late (15 or more minutes).  Arriving late affects you and other patients whose appointments are after yours.  Also, if you miss three or more appointments without notifying the office, you may be dismissed from the clinic at the provider's discretion.      For prescription refill requests, have your pharmacy contact our office and allow 72 hours for refills to be completed.    Today you received the following chemotherapy and/or immunotherapy agents : Keytruda    To help prevent nausea and vomiting after your treatment, we encourage you to take your nausea medication as directed.  BELOW ARE SYMPTOMS THAT SHOULD BE REPORTED IMMEDIATELY: *FEVER GREATER THAN 100.4 F (38 C) OR HIGHER *CHILLS OR SWEATING *NAUSEA AND VOMITING THAT IS NOT CONTROLLED WITH YOUR NAUSEA MEDICATION *UNUSUAL SHORTNESS OF BREATH *UNUSUAL BRUISING OR BLEEDING *URINARY PROBLEMS (pain or burning when urinating, or frequent urination) *BOWEL PROBLEMS (unusual diarrhea, constipation, pain near the anus) TENDERNESS IN MOUTH AND THROAT WITH OR WITHOUT PRESENCE OF ULCERS (sore throat, sores in mouth, or a toothache) UNUSUAL RASH, SWELLING OR PAIN  UNUSUAL VAGINAL DISCHARGE OR ITCHING   Items with * indicate a potential emergency and should be followed up as soon as possible or go to the Emergency Department if any problems should occur.  Please show the CHEMOTHERAPY ALERT CARD or IMMUNOTHERAPY ALERT CARD at check-in  to the Emergency Department and triage nurse.  Should you have questions after your visit or need to cancel or reschedule your appointment, please contact [facility]  [phone] and follow the prompts.  Office hours are 8:00 a.m. to 4:30 p.m. Monday - Friday. Please note that voicemails left after 4:00 p.m. may not be returned until the following business day.  We are closed weekends and major holidays. You have access to a nurse at all times for urgent questions. Please call the main number to the clinic [phone] and follow the prompts.  For any non-urgent questions, you may also contact your provider using MyChart. We now offer e-Visits for anyone 18 and older to request care online for non-urgent symptoms. For details visit [email or web address].   Also download the MyChart app! Go to the app store, search "MyChart", open the app, select [facility], and log in with your MyChart username and password.  Due to Covid, a mask is required upon entering the hospital/clinic. If you do not have a mask, one will be given to you upon arrival. For doctor visits, patients may have 1 support person aged 18 or older with them. For treatment visits, patients cannot have anyone with them due to current Covid guidelines and our immunocompromised population.  

## 2021-05-22 NOTE — Assessment & Plan Note (Addendum)
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 On keytruda-  OCT 4th, 2022-  The irregular/ill-defined wall thickening seen previously in the right renal pelvis and proximal ureter appears decreased in the interval with less enhancing tissue visible on today's study, but this is a subtle change. Stable 10 mm short axis portal caval lymph node.  # Proceed with  [place] today; Labs today reviewed; Labs today reviewed;  acceptable for treatment today.  We will repeat imaging prior to next visit.  # Incidental on CT OCT 10th, 2022-Interval development of a 13 mm lobular polypoid lesion right mainstem- mucus/secretion Vs true lesion- Follow-up CT chest in 6 weeks.   #Descending colon mass- PET scan- FEB 2022-uptake in the cecum;  [history of Lynch syndrome]-colonoscopy 25 mm polyp; biopsy mucinous carcinoma- given risk vs benefits -hold surgery for now; re-eval with colonoscopy in NOV  6th [discussed with dr.Byrnett]. Stable appearance of the wall thickening along the proximal sigmoid colon.   # Iron deficiency anemia-hemoglobin 11-12; February 2022 iron studies/ferritin-LOW; hold Venofer with infusions- STABLE  # Bilateral LE ulcers-s/p wound care evaluation; s/p PTCA with Dr.Dew. bil Arterial Dopp-wnl- STABLE.   # Right elbow bursitis:s/p steroid injection.  No contraindications from oncology standpoint.-STABLE  #DISPOSITION: #  Keytruda;  # follow up in 3 weeks-;MD labs-cbc/cmp;  Keytruda; ;Dr.B.

## 2021-05-28 ENCOUNTER — Encounter: Payer: Medicaid Other | Attending: Physician Assistant | Admitting: Physician Assistant

## 2021-05-28 ENCOUNTER — Other Ambulatory Visit: Payer: Self-pay

## 2021-05-28 DIAGNOSIS — Z5900 Homelessness unspecified: Secondary | ICD-10-CM | POA: Diagnosis not present

## 2021-05-28 DIAGNOSIS — C689 Malignant neoplasm of urinary organ, unspecified: Secondary | ICD-10-CM | POA: Insufficient documentation

## 2021-05-28 DIAGNOSIS — B1920 Unspecified viral hepatitis C without hepatic coma: Secondary | ICD-10-CM | POA: Insufficient documentation

## 2021-05-28 DIAGNOSIS — L89894 Pressure ulcer of other site, stage 4: Secondary | ICD-10-CM | POA: Insufficient documentation

## 2021-05-28 DIAGNOSIS — I872 Venous insufficiency (chronic) (peripheral): Secondary | ICD-10-CM | POA: Insufficient documentation

## 2021-05-28 DIAGNOSIS — L97322 Non-pressure chronic ulcer of left ankle with fat layer exposed: Secondary | ICD-10-CM | POA: Diagnosis not present

## 2021-05-28 DIAGNOSIS — I69354 Hemiplegia and hemiparesis following cerebral infarction affecting left non-dominant side: Secondary | ICD-10-CM | POA: Insufficient documentation

## 2021-05-28 DIAGNOSIS — I878 Other specified disorders of veins: Secondary | ICD-10-CM | POA: Diagnosis not present

## 2021-05-28 NOTE — Progress Notes (Addendum)
Chad [name] ([id number]) Visit Report for 05/28/2021 Chief Complaint Document Details Patient Name: Chad [name], Chad [name] Date of Service: 05/28/2021 10:45 AM Medical Record Number: [id number] Patient Account Number: [id number] Date of Birth/Sex: 03-05-1961 (60 y.o. M) Treating RN: Cornell Barman Primary Care Provider: SYSTEM, PCP Other Clinician: Referring Provider: Clayborn Bigness Treating Provider/Extender: Skipper Cliche in Treatment: 41 Information Obtained from: Patient Chief Complaint Left ankle and right foot and heel ulcers Electronic Signature(s) Signed: 05/28/2021 10:34:06 AM By: Worthy Keeler PA-C Entered By: Worthy Keeler on 05/28/2021 10:34:06 Chad [name] ([id number]) -------------------------------------------------------------------------------- Debridement Details Patient Name: Chad [name] Date of Service: 05/28/2021 10:45 AM Medical Record Number: [id number] Patient Account Number: [id number] Date of Birth/Sex: 1961/04/08 (60 y.o. M) Treating RN: Cornell Barman Primary Care Provider: SYSTEM, PCP Other Clinician: Referring Provider: Clayborn Bigness Treating Provider/Extender: Skipper Cliche in Treatment: 41 Debridement Performed for Wound #8 Left,Medial Ankle Assessment: Performed By: Physician Tommie Sams., PA-C Debridement Type: Debridement Severity of Tissue Pre Debridement: Fat layer exposed Level of Consciousness (Pre- Awake and Alert procedure): Pre-procedure Verification/Time Out Yes - 11:43 Taken: Total Area Debrided (L x W): 1.4 (cm) x 0.9 (cm) = 1.26 (cm) Tissue and other material Non-Viable, Slough, Subcutaneous, Slough debrided: Level: Skin/Subcutaneous Tissue Debridement Description: Excisional Instrument: Curette Bleeding: Minimum Hemostasis Achieved: Pressure Response to Treatment: Procedure was tolerated well Level of Consciousness (Post- Awake and Alert procedure): Post Debridement Measurements of Total Wound Length: (cm)  1.4 Width: (cm) 0.9 Depth: (cm) 0.2 Volume: (cm) 0.198 Character of Wound/Ulcer Post Debridement: Stable Severity of Tissue Post Debridement: Fat layer exposed Post Procedure Diagnosis Same as Pre-procedure Electronic Signature(s) Signed: 05/29/2021 9:48:11 AM By: Gretta Cool, BSN, RN, CWS, Kim RN, BSN Signed: 05/29/2021 6:58:05 PM By: Worthy Keeler PA-C Entered By: Gretta Cool, BSN, RN, CWS, Kim on 05/28/2021 11:44:00 Chad [name] ([id number]) -------------------------------------------------------------------------------- HPI Details Patient Name: Chad [name] Date of Service: 05/28/2021 10:45 AM Medical Record Number: [id number] Patient Account Number: [id number] Date of Birth/Sex: 03-10-1961 (60 y.o. M) Treating RN: Cornell Barman Primary Care Provider: SYSTEM, PCP Other Clinician: Referring Provider: Clayborn Bigness Treating Provider/Extender: Skipper Cliche in Treatment: 41 History of Present Illness HPI Description: 10/08/18 on evaluation today patient actually presents to our office for initial evaluation concerning wounds that he has of the bilateral lower extremities. 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 He also has bilateral venous stasis. He apparently has been homeless currently following discharge from the hospital apparently he has been placed at [facility] which is is a skilled nursing facility locally. Nonetheless fortunately he does not show any signs of infection at this time which is good news. In fact several of the wound actually appears to be showing some signs of improvement already in my pinion. There are a couple areas in the left leg in particular there likely gonna require some sharp debridement to help clear away some necrotic tissue and help with more sufficient healing. No fevers, chills, nausea,  or vomiting noted at this time. 10/15/18 on evaluation today patient actually appears to be doing very well in regard to his bilateral lower extremities. He's been tolerating the dressing changes without complication. Fortunately there does not appear to be any evidence of active infection at this time which is great news. Overall I'm actually very pleased with how this has progressed in just one visits time. Readmission: 08/14/2020 upon  evaluation today patient presents for re-evaluation here in our clinic. He is having issues with his left ankle region as well as his right toe and his right heel. He tells me that the toe and heel actually began as a area that was itching that he was scratching and then subsequently opened up into wounds. These may have been abscess areas I presume based on what I am seeing currently. With regard to his left ankle region he tells me this was a similar type occurrence although he does have venous stasis this very well may be more of a venous leg ulcer more than anything. Nonetheless I do believe that the patient would benefit from appropriate and aggressive wound care to try to help get things under better control here. He does have history of a stroke on the left side affecting him to some degree there that he is able to stand although he does have some residual weakness. Otherwise again the patient does have chronic venous insufficiency as previously noted. His arterial studies most recently obtained showed that he had an ABI on the right of 1.16 with a TBI of 0.52 and on the left and ABI of 1.14 with a TBI of 0.81. That was obtained on 06/19/2020. 08/28/2020 upon evaluation today patient appears to be doing decently well in regard to his wounds in general. He has been tolerating the dressing changes without complication. Fortunately there does not appear to be any signs of active infection which is great news. With that being said I think the [facility] is doing  a good job I would recommend that we likely continue with that currently. 09/11/2020 upon evaluation today patient's wounds did not appear to be doing too poorly but again he is not really showing signs of significant improvement with regard to any of the wounds on the right. None of them have Hydrofera Blue on them I am not exactly sure why this is not being followed as the facility did not contact us to let us know of any issues with obtaining dressings or otherwise. With that being said he is supposed to be using Hydrofera Blue on both of the wounds on the right foot as well as the ankle wound on the left side. 09/18/2020 upon evaluation today patient appears to be doing poorly with regard to his wounds. Again right now the left ankle in particular showed signs of extreme maceration. Apparently he was told by someone with staff at [facility] they could not get the [facility]. With that being said this is something that is never been relayed to [place] one way or another. Also the patient subsequently has not supposed to have a border gauze dressing on. He should have an ABD pad and roll gauze to secure as this drains much too much just to have a border gauze dressing to cover. Nonetheless the fact that they are not using the appropriate dressing is directly causing deterioration of the left ankle wound it is significantly worse today compared to what it was previous. I did attempt to call  healthcare while the patient was here I called three times and got no one to even pick up the phone. After this I had my for an office coordinator call and she was able to finally get through and leave a message with the D ON as of dictation of this note which is roughly about an hour and a half later I still have not been able to speak with anyone at  the facility. 09/25/2020 upon evaluation today patient actually showing signs of good improvement which is excellent news. He has been tolerating the  dressing changes without complication. Fortunately there is no signs of active infection which is great news. No fevers, chills, nausea, vomiting, or diarrhea. I do feel like the facility has been doing a much better job at taking care of him as far as the dressings are concerned. However the director of nursing never did call me back. 10/09/2020 upon evaluation today patient appears to be doing well with regard to his wound. The toe ulcer did require some debridement but the other 2 areas actually appear to be doing quite well. 10/19/2020 upon evaluation today patient actually appears to be doing very well in regard to his wounds. In fact the heel does appear to be completely healed. The toe is doing better in the medial ankle on the left is also doing better. Overall I think he is headed in the right direction. 10/26/2020 upon evaluation today patient appears to be doing well with regard to his wound. He is showing signs of improvement which is great news and overall I am very pleased with where things stand today. No fevers, chills, nausea, vomiting, or diarrhea. 11/02/2020 upon evaluation today patient appears to be doing well with regard to his wounds. He has been tolerating the dressing changes without complication overall I am extremely pleased with where things stand today. He in regard to the toe is almost completely healed and the medial ankle on the left is doing much better. 11/09/2020 upon evaluation today patient appears to be doing a little poorly in regard to his left medial ankle ulcer. Fortunately there does not appear to be any signs of systemic infection but unfortunately locally he does appear to be infected in fact he has blue-green drainage consistent with Pseudomonas. SANDEEP, Chad [name] ([id number]) 11/16/2020 upon evaluation today patient appears to be doing well with regard to his wound. It actually appears to be doing better. I did place him on gentamicin cream since the Cipro was  actually resistant even though he was positive for Pseudomonas on culture. Overall I think that he does seem to be doing better though I am unsure whether or not they have actually been putting the cream on. The patient is not sure that we did talk to the nurse directly and she was going to initiate that treatment. Fortunately there does not appear to be any signs of active infection at this time. No fevers, chills, nausea, vomiting, or diarrhea. 4/28; the area on the right second toe is close to healed. Left medial ankle required debridement 12/07/2020 upon evaluation today patient appears to be doing well with regard to his wounds. In fact the right second toe appears to be completely healed which is great news. Fortunately there does not appear to be any signs of active infection at this time which is also great news. I think we can probably discontinue the gentamicin on top of everything else. 12/14/2020 upon evaluation today patient appears to be doing well with regard to his wound. He is making good progress and overall very pleased with where things stand today. There is no signs of active infection at this time which is great news. 12/28/2020 upon evaluation today patient appears to be doing well with regard to his wounds. He has been tolerating the dressing changes without complication. Fortunately there is no signs of active infection at this time. No fevers, chills, nausea, vomiting, or diarrhea. 12/28/2020 upon  evaluation today patient's wound bed actually showed signs of excellent improvement. He has great epithelization and granulation I do not see any signs of infection overall I am extremely pleased with where things stand at this point. No fevers, chills, nausea, vomiting, or diarrhea. 01/11/2021 upon evaluation today patient appears to be doing well with regard to his wound on his leg. He has been tolerating the dressing changes without complication. Fortunately there does not appear to be  any signs of active infection which is great news. No fevers, chills, nausea, vomiting, or diarrhea. 01/25/2021 upon evaluation today patient appears to be doing well with regard to his wound. He has been tolerating the dressing changes without complication. Fortunately the collagen seems to be doing a great job which is excellent news. No fevers, chills, nausea, vomiting, or diarrhea. 02/08/2021 upon evaluation today patient's wound is actually looking a little bit worse especially in the periwound compared to previous. Fortunately there does not appear to be any signs of infection which is great news with that being said he does have some irritation around the periphery of the wound which has me more concerned. He actually had a dressing on that had not been changed in 3 days. He also is supposed to have daily dressing changes. With regard to the dressing applied he had a silver alginate dressing and silver collagen is what is recommended and ordered. He also had no Desitin around the edges of the wound in the periwound region although that is on the order inspect to be done as well. In general I was very concerned I did contact [facility] actually spoke with Magda Paganini who is the scheduling individual and subsequently she stated that she would pass the information to the D ON apparently the D ON was not available to talk to me when I call today. 02/18/2021 upon evaluation today patient's wound is actually showing signs of improvement. Fortunately there does not appear to be any evidence of infection which is great news overall I am extremely pleased with where things stand today. No fevers, chills, nausea, vomiting, or diarrhea. 8/3; patient presents for 1 week follow-up. He has no issues or complaints today. He denies signs of infection. 03/11/2021 upon evaluation today patient appears to be doing well with regard to his wound. He does have a little bit of slough noted on the surface of the wound  but fortunately there does not appear to be any signs of active infection at this time. No fevers, chills, nausea, vomiting, or diarrhea. 03/18/2021 upon evaluation today patient appears to be doing well with regard to his wound. He has been tolerating the dressing changes without complication. There was a little irritation more proximal to where the wound was that was not noted last week but nonetheless this is very superficial just seems to be more irritation we just need to make sure to put a good amount of the zinc over the area in my opinion. Otherwise he does not seem to be doing significantly worse at all which is great news. 03/25/2021 upon evaluation today patient appears to be doing well with regard to his wound. He is going require some sharp debridement today to clear with some of the necrotic debris. I did perform this today without complication postdebridement wound bed appears to be doing much better this is great news. 04/08/2021 upon evaluation today patient appears to be doing decently well in regard to his wound although the overall measurement is not significantly smaller compared to previous.  It is gone down a little bit but still the facility continues to not really put the appropriate dressings in place in fact he was supposed to have collagen we think he probably had more of an allergy to At this point. Fortunately there does not appear to be any signs of active infection systemically though locally I do not see anything on initial visualization either as far as erythema or warmth. 04/15/2021 upon evaluation today patient appears to be doing well with regard to his wound. He is actually showing signs of improvement. I did place him on antibiotics last week, Cipro. He has been taking that 2 times a day and seems to be tolerating it very well. I do not see any evidence of worsening and in fact the overall appearance of the wound is smaller today which is also great news. 9/26; left  medial ankle chronic venous insufficiency wound is improved. Using Hydrofera Blue 10/10; left medial ankle chronic venous insufficiency. Wound has not changed much in appearance completely nonviable surface. Apparently there have been problems getting the right product on the wound at the facility although he came in with [facility] on today 05/14/2021 upon evaluation today patient appears to be doing well with regard to his wound. I think he is making progress here which is good news. Fortunately there does not appear to be any signs of active infection at this time. No fevers, chills, nausea, vomiting, or diarrhea. 05/20/2021 upon evaluation today patient appears to be doing well with regard to his wound. He is showing signs of good improvement which is great news. There does not appear to be any evidence of active infection which is also excellent news. No fevers, chills, nausea, vomiting, or diarrhea. 05/28/2021 upon evaluation today patient appears to be doing quite well. There does not appear to be any signs of active infection at this time which is great news. Overall I am extremely pleased with where things stand today. I think he is headed in the right direction. Electronic Signature(s) Chad [name], Chad [name] ([id number]) Signed: 05/29/2021 6:41:35 PM By: Worthy Keeler PA-C Entered By: Worthy Keeler on 05/29/2021 18:41:35 Chad [name], Chad [name] ([id number]) -------------------------------------------------------------------------------- Physical Exam Details Patient Name: Chad [name] Date of Service: 05/28/2021 10:45 AM Medical Record Number: [id number] Patient Account Number: [id number] Date of Birth/Sex: 03/08/1961 (60 y.o. M) Treating RN: Cornell Barman Primary Care Provider: SYSTEM, PCP Other Clinician: Referring Provider: Clayborn Bigness Treating Provider/Extender: Skipper Cliche in Treatment: 26 Constitutional Well-nourished and well-hydrated in no acute distress. Respiratory normal  breathing without difficulty. Psychiatric this patient is able to make decisions and demonstrates good insight into disease process. Alert and Oriented x 3. pleasant and cooperative. Notes Patient's wound bed require sharp debridement to clear away some of the necrotic debris he tolerated that today without complication postdebridement wound bed appears to be doing much better. Electronic Signature(s) Signed: 05/29/2021 6:41:50 PM By: Worthy Keeler PA-C Entered By: Worthy Keeler on 05/29/2021 18:41:50 Chad [name] ([id number]) -------------------------------------------------------------------------------- Physician Orders Details Patient Name: Chad [name] Date of Service: 05/28/2021 10:45 AM Medical Record Number: [id number] Patient Account Number: [id number] Date of Birth/Sex: 1961-04-22 (60 y.o. 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 Bathing/ Shower/ Hygiene o May shower; gently cleanse wound with antibacterial soap, rinse and pat dry prior to dressing wounds Edema Control - Lymphedema / Segmental Compressive Device / Other o Elevate legs to the level of the heart and pump ankles as often as possible o Elevate leg(s) parallel to the floor when sitting. o DO YOUR BEST to sleep in the bed at night. DO NOT sleep in your recliner. Long hours of sitting in a recliner leads to swelling of the legs and/or potential wounds on your backside. Wound Treatment Wound #8 - Ankle Wound Laterality:  Left, Medial Cleanser: Normal Saline 3 x Per Week/30 Days Discharge Instructions: Wash your hands with soap and water. Remove old dressing, discard into plastic bag and place into trash. Cleanse the wound with Normal Saline prior to applying a clean dressing using gauze sponges, not tissues or cotton balls. Do not scrub or use excessive force. Pat dry using gauze sponges, not tissue or cotton balls. Peri-Wound Care: Desitin Maximum Strength Ointment 4 (oz) 3 x Per Week/30 Days Discharge Instructions: Apply zinc periwound Primary Dressing: Hydrofera Blue Ready Transfer Foam, 2.5x2.5 (in/in) 3 x Per Week/30 Days Discharge Instructions: Apply Hydrofera Blue Ready to wound bed as directed (EXTRA SENT WITH PATIENT, PLEASE USE) Secondary Dressing: Kerlix 4.5 x 4.1 (in/yd) 3 x Per Week/30 Days Discharge Instructions: Apply kerlix over ABD pad Secured With: 46M Medipore H Soft Cloth Surgical Tape, 2x2 (in/yd) 3 x Per Week/30 Days Discharge Instructions: Secure kerlix Electronic Signature(s) Signed: 05/29/2021 9:48:11 AM By: Gretta Cool, BSN, RN, CWS, Kim RN, BSN Signed: 05/29/2021 6:58:05 PM By: Worthy Keeler PA-C Entered By: Gretta Cool, BSN, RN, CWS, Kim on 05/28/2021 11:48:00 Chad [name] ([id number]) -------------------------------------------------------------------------------- Problem List Details Patient Name: Chad [name] Date of Service: 05/28/2021 10:45 AM Medical Record Number: [id number] Patient Account Number: [id number] Date of Birth/Sex: Dec 22, 1960 (60 y.o. M) Treating RN: Cornell Barman Primary Care Provider: SYSTEM, PCP Other Clinician: Referring Provider: Clayborn Bigness Treating Provider/Extender: Skipper Cliche in Treatment: 41 Active Problems ICD-10 Encounter Code Description Active Date MDM Diagnosis I87.2 Venous insufficiency (chronic) (peripheral) 08/14/2020 No Yes L97.322 Non-pressure chronic ulcer of left ankle with fat layer exposed 08/14/2020 No Yes I69.354 Hemiplegia and  hemiparesis following cerebral infarction affecting left 08/14/2020 No Yes non-dominant side Inactive Problems ICD-10 Code Description Active Date Inactive Date L97.412 Non-pressure chronic ulcer of right heel and midfoot with fat layer exposed 08/14/2020 08/14/2020 L97.512 Non-pressure chronic ulcer of other part of right foot with fat layer exposed 08/14/2020 08/14/2020 Resolved Problems Electronic Signature(s) Signed: 05/28/2021 10:34:00 AM By: Worthy Keeler PA-C Entered By: Worthy Keeler on 05/28/2021 10:34:00 Chad [name] ([id number]) -------------------------------------------------------------------------------- Progress Note Details Patient Name: Chad [name] Date of Service: 05/28/2021 10:45 AM Medical Record Number: [id number] Patient Account Number: [id number] Date of Birth/Sex: 11-13-1960 (60 y.o. M) Treating RN: Cornell Barman Primary Care Provider: SYSTEM, PCP Other Clinician: Referring Provider: Clayborn Bigness Treating Provider/Extender: Skipper Cliche in Treatment: 41 Subjective Chief Complaint Information obtained from Patient Left ankle and right foot and heel ulcers History of Present Illness (HPI) 10/08/18 on evaluation today patient actually presents to our office for initial evaluation concerning wounds that he has of the bilateral lower extremities. 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 He also has bilateral venous stasis. He apparently has been  homeless currently following discharge from the hospital apparently he has been placed at [facility] which is is a skilled nursing facility locally. Nonetheless fortunately he does not show any signs of infection at this time which is good news. In fact several of the wound actually appears to be showing some signs of improvement already in my pinion. There  are a couple areas in the left leg in particular there likely gonna require some sharp debridement to help clear away some necrotic tissue and help with more sufficient healing. No fevers, chills, nausea, or vomiting noted at this time. 10/15/18 on evaluation today patient actually appears to be doing very well in regard to his bilateral lower extremities. He's been tolerating the dressing changes without complication. Fortunately there does not appear to be any evidence of active infection at this time which is great news. Overall I'm actually very pleased with how this has progressed in just one visits time. Readmission: 08/14/2020 upon evaluation today patient presents for re-evaluation here in our clinic. He is having issues with his left ankle region as well as his right toe and his right heel. He tells me that the toe and heel actually began as a area that was itching that he was scratching and then subsequently opened up into wounds. These may have been abscess areas I presume based on what I am seeing currently. With regard to his left ankle region he tells me this was a similar type occurrence although he does have venous stasis this very well may be more of a venous leg ulcer more than anything. Nonetheless I do believe that the patient would benefit from appropriate and aggressive wound care to try to help get things under better control here. He does have history of a stroke on the left side affecting him to some degree there that he is able to stand although he does have some residual weakness. Otherwise again the patient does have chronic venous insufficiency as previously noted. His arterial studies most recently obtained showed that he had an ABI on the right of 1.16 with a TBI of 0.52 and on the left and ABI of 1.14 with a TBI of 0.81. That was obtained on 06/19/2020. 08/28/2020 upon evaluation today patient appears to be doing decently well in regard to his wounds in general. He has  been tolerating the dressing changes without complication. Fortunately there does not appear to be any signs of active infection which is great news. With that being said I think the [facility] is doing a good job I would recommend that we likely continue with that currently. 09/11/2020 upon evaluation today patient's wounds did not appear to be doing too poorly but again he is not really showing signs of significant improvement with regard to any of the wounds on the right. None of them have Hydrofera Blue on them I am not exactly sure why this is not being followed as the facility did not contact us to let us know of any issues with obtaining dressings or otherwise. With that being said he is supposed to be using Hydrofera Blue on both of the wounds on the right foot as well as the ankle wound on the left side. 09/18/2020 upon evaluation today patient appears to be doing poorly with regard to his wounds. Again right now the left ankle in particular showed signs of extreme maceration. Apparently he was told by someone with staff at [facility] they could not get the [facility]. With that being  said this is something that is never been relayed to [place] one way or another. Also the patient subsequently has not supposed to have a border gauze dressing on. He should have an ABD pad and roll gauze to secure as this drains much too much just to have a border gauze dressing to cover. Nonetheless the fact that they are not using the appropriate dressing is directly causing deterioration of the left ankle wound it is significantly worse today compared to what it was previous. I did attempt to call [facility] while the patient was here I called three times and got no one to even pick up the phone. After this I had my for an office coordinator call and she was able to finally get through and leave a message with the D ON as of dictation of this note which is roughly about an hour and a half  later I still have not been able to speak with anyone at the facility. 09/25/2020 upon evaluation today patient actually showing signs of good improvement which is excellent news. He has been tolerating the dressing changes without complication. Fortunately there is no signs of active infection which is great news. No fevers, chills, nausea, vomiting, or diarrhea. I do feel like the facility has been doing a much better job at taking care of him as far as the dressings are concerned. However the director of nursing never did call me back. 10/09/2020 upon evaluation today patient appears to be doing well with regard to his wound. The toe ulcer did require some debridement but the other 2 areas actually appear to be doing quite well. 10/19/2020 upon evaluation today patient actually appears to be doing very well in regard to his wounds. In fact the heel does appear to be completely healed. The toe is doing better in the medial ankle on the left is also doing better. Overall I think he is headed in the right direction. 10/26/2020 upon evaluation today patient appears to be doing well with regard to his wound. He is showing signs of improvement which is great news and overall I am very pleased with where things stand today. No fevers, chills, nausea, vomiting, or diarrhea. 11/02/2020 upon evaluation today patient appears to be doing well with regard to his wounds. He has been tolerating the dressing changes without complication overall I am extremely pleased with where things stand today. He in regard to the toe is almost completely healed and the medial [facility], Chad ([id number]) ankle on the left is doing much better. 11/09/2020 upon evaluation today patient appears to be doing a little poorly in regard to his left medial ankle ulcer. Fortunately there does not appear to be any signs of systemic infection but unfortunately locally he does appear to be infected in fact he has blue-green drainage  consistent with Pseudomonas. 11/16/2020 upon evaluation today patient appears to be doing well with regard to his wound. It actually appears to be doing better. I did place him on gentamicin cream since the Cipro was actually resistant even though he was positive for Pseudomonas on culture. Overall I think that he does seem to be doing better though I am unsure whether or not they have actually been putting the cream on. The patient is not sure that we did talk to the nurse directly and she was going to initiate that treatment. Fortunately there does not appear to be any signs of active infection at this time. No fevers, chills, nausea, vomiting, or diarrhea. 4/28; the  area on the right second toe is close to healed. Left medial ankle required debridement 12/07/2020 upon evaluation today patient appears to be doing well with regard to his wounds. In fact the right second toe appears to be completely healed which is great news. Fortunately there does not appear to be any signs of active infection at this time which is also great news. I think we can probably discontinue the gentamicin on top of everything else. 12/14/2020 upon evaluation today patient appears to be doing well with regard to his wound. He is making good progress and overall very pleased with where things stand today. There is no signs of active infection at this time which is great news. 12/28/2020 upon evaluation today patient appears to be doing well with regard to his wounds. He has been tolerating the dressing changes without complication. Fortunately there is no signs of active infection at this time. No fevers, chills, nausea, vomiting, or diarrhea. 12/28/2020 upon evaluation today patient's wound bed actually showed signs of excellent improvement. He has great epithelization and granulation I do not see any signs of infection overall I am extremely pleased with where things stand at this point. No fevers, chills, nausea, vomiting,  or diarrhea. 01/11/2021 upon evaluation today patient appears to be doing well with regard to his wound on his leg. He has been tolerating the dressing changes without complication. Fortunately there does not appear to be any signs of active infection which is great news. No fevers, chills, nausea, vomiting, or diarrhea. 01/25/2021 upon evaluation today patient appears to be doing well with regard to his wound. He has been tolerating the dressing changes without complication. Fortunately the collagen seems to be doing a great job which is excellent news. No fevers, chills, nausea, vomiting, or diarrhea. 02/08/2021 upon evaluation today patient's wound is actually looking a little bit worse especially in the periwound compared to previous. Fortunately there does not appear to be any signs of infection which is great news with that being said he does have some irritation around the periphery of the wound which has me more concerned. He actually had a dressing on that had not been changed in 3 days. He also is supposed to have daily dressing changes. With regard to the dressing applied he had a silver alginate dressing and silver collagen is what is recommended and ordered. He also had no Desitin around the edges of the wound in the periwound region although that is on the order inspect to be done as well. In general I was very concerned I did contact [facility] actually spoke with Magda Paganini who is the scheduling individual and subsequently she stated that she would pass the information to the D ON apparently the D ON was not available to talk to me when I call today. 02/18/2021 upon evaluation today patient's wound is actually showing signs of improvement. Fortunately there does not appear to be any evidence of infection which is great news overall I am extremely pleased with where things stand today. No fevers, chills, nausea, vomiting, or diarrhea. 8/3; patient presents for 1 week follow-up. He has  no issues or complaints today. He denies signs of infection. 03/11/2021 upon evaluation today patient appears to be doing well with regard to his wound. He does have a little bit of slough noted on the surface of the wound but fortunately there does not appear to be any signs of active infection at this time. No fevers, chills, nausea, vomiting, or diarrhea. 03/18/2021 upon evaluation  today patient appears to be doing well with regard to his wound. He has been tolerating the dressing changes without complication. There was a little irritation more proximal to where the wound was that was not noted last week but nonetheless this is very superficial just seems to be more irritation we just need to make sure to put a good amount of the zinc over the area in my opinion. Otherwise he does not seem to be doing significantly worse at all which is great news. 03/25/2021 upon evaluation today patient appears to be doing well with regard to his wound. He is going require some sharp debridement today to clear with some of the necrotic debris. I did perform this today without complication postdebridement wound bed appears to be doing much better this is great news. 04/08/2021 upon evaluation today patient appears to be doing decently well in regard to his wound although the overall measurement is not significantly smaller compared to previous. It is gone down a little bit but still the facility continues to not really put the appropriate dressings in place in fact he was supposed to have collagen we think he probably had more of an allergy to At this point. Fortunately there does not appear to be any signs of active infection systemically though locally I do not see anything on initial visualization either as far as erythema or warmth. 04/15/2021 upon evaluation today patient appears to be doing well with regard to his wound. He is actually showing signs of improvement. I did place him on antibiotics last week,  Cipro. He has been taking that 2 times a day and seems to be tolerating it very well. I do not see any evidence of worsening and in fact the overall appearance of the wound is smaller today which is also great news. 9/26; left medial ankle chronic venous insufficiency wound is improved. Using Hydrofera Blue 10/10; left medial ankle chronic venous insufficiency. Wound has not changed much in appearance completely nonviable surface. Apparently there have been problems getting the right product on the wound at the facility although he came in with [facility] on today 05/14/2021 upon evaluation today patient appears to be doing well with regard to his wound. I think he is making progress here which is good news. Fortunately there does not appear to be any signs of active infection at this time. No fevers, chills, nausea, vomiting, or diarrhea. 05/20/2021 upon evaluation today patient appears to be doing well with regard to his wound. He is showing signs of good improvement which is great news. There does not appear to be any evidence of active infection which is also excellent news. No fevers, chills, nausea, vomiting, or diarrhea. Chad [name], Chad [name] ([id number]) 05/28/2021 upon evaluation today patient appears to be doing quite well. There does not appear to be any signs of active infection at this time which is great news. Overall I am extremely pleased with where things stand today. I think he is headed in the right direction. Objective Constitutional Well-nourished and well-hydrated in no acute distress. Vitals Time Taken: 11:00 AM, Height: 67 in, Weight: 160 lbs, BMI: 25.1, Temperature: 98.7 F, Pulse: 83 bpm, Respiratory Rate: 20 breaths/min, Blood Pressure: 118/66 mmHg. Respiratory normal breathing without difficulty. Psychiatric this patient is able to make decisions and demonstrates good insight into disease process. Alert and Oriented x 3. pleasant and cooperative. General Notes:  Patient's wound bed require sharp debridement to clear away some of the necrotic debris he tolerated that today without complication  postdebridement wound bed appears to be doing much better. Integumentary (Hair, Skin) Wound #8 status is Open. Original cause of wound was Gradually Appeared. The date acquired was: 07/12/2019. The wound has been in treatment 41 weeks. The wound is located on the Left,Medial Ankle. The wound measures 1.4cm length x 0.9cm width x 0.1cm depth; 0.99cm^2 area and 0.099cm^3 volume. There is Fat Layer (Subcutaneous Tissue) exposed. There is no tunneling noted. There is a medium amount of serous drainage noted. The wound margin is flat and intact. There is medium (34-66%) red granulation within the wound bed. There is a medium (34-66%) amount of necrotic tissue within the wound bed including Adherent Slough. 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 There was a Excisional Skin/Subcutaneous Tissue Debridement with a total area of 1.26 sq cm performed by Tommie Sams., PA-C. With the following instrument(s): Curette to remove Non-Viable tissue/material. Material removed includes Subcutaneous Tissue and Slough and. No specimens were taken. A time out was conducted at 11:43, prior to the start of the procedure. A Minimum amount of bleeding was controlled with Pressure. The procedure was tolerated well. Post Debridement Measurements: 1.4cm length x 0.9cm width x 0.2cm depth; 0.198cm^3 volume. Character of Wound/Ulcer Post Debridement is stable. Severity of Tissue Post Debridement is: Fat layer exposed. Post procedure  Diagnosis Wound #8: Same as Pre-Procedure Plan Follow-up Appointments: Return Appointment in 1 week. Chad [name], Chad [name] ([id number]) Bathing/ Shower/ Hygiene: May shower; gently cleanse wound with antibacterial soap, rinse and pat dry prior to dressing wounds Edema Control - Lymphedema / Segmental Compressive Device / Other: Elevate legs to the level of the heart and pump ankles as often as possible Elevate leg(s) parallel to the floor when sitting. DO YOUR BEST to sleep in the bed at night. DO NOT sleep in your recliner. Long hours of sitting in a recliner leads to swelling of the legs and/or potential wounds on your backside. WOUND #8: - Ankle Wound Laterality: Left, Medial Cleanser: Normal Saline 3 x Per Week/30 Days Discharge Instructions: Wash your hands with soap and water. Remove old dressing, discard into plastic bag and place into trash. Cleanse the wound with Normal Saline prior to applying a clean dressing using gauze sponges, not tissues or cotton balls. Do not scrub or use excessive force. Pat dry using gauze sponges, not tissue or cotton balls. Peri-Wound Care: Desitin Maximum Strength Ointment 4 (oz) 3 x Per Week/30 Days Discharge Instructions: Apply zinc periwound Primary Dressing: Hydrofera Blue Ready Transfer Foam, 2.5x2.5 (in/in) 3 x Per Week/30 Days Discharge Instructions: Apply Hydrofera Blue Ready to wound bed as directed (EXTRA SENT WITH PATIENT, PLEASE USE) Secondary Dressing: Kerlix 4.5 x 4.1 (in/yd) 3 x Per Week/30 Days Discharge Instructions: Apply kerlix over ABD pad Secured With: 30M Medipore H Soft Cloth Surgical Tape, 2x2 (in/yd) 3 x Per Week/30 Days Discharge Instructions: Secure kerlix 1. Would recommend currently that we going to continue with the wound care measures as before and the patient is in agreement with that plan this includes the use of the [facility] dressing which is doing a great job. 2. We will also continue with the ABD pad and roll gauze  to secure in place. We will see patient back for  reevaluation in 1 week here in the clinic. If anything worsens or changes patient will contact our office for additional recommendations. Electronic Signature(s) Signed: 05/29/2021 6:42:13 PM By: Worthy Keeler PA-C Entered By: Worthy Keeler on 05/29/2021 18:42:12 Chad [name] ([id number]) -------------------------------------------------------------------------------- SuperBill Details Patient Name: Chad [name] Date of Service: 05/28/2021 Medical Record Number: [id number] Patient Account Number: [id number] Date of Birth/Sex: [birth year] (60 y.o. M) Treating RN: Cornell Barman Primary Care Provider: SYSTEM, PCP Other Clinician: Referring Provider: Clayborn Bigness Treating Provider/Extender: Skipper Cliche in Treatment: 41 Diagnosis Coding ICD-10 Codes Code Description I87.2 Venous insufficiency (chronic) (peripheral) L97.322 Non-pressure chronic ulcer of left ankle with fat layer exposed 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 side Facility Procedures CPT4 Code: [id number] Description: 42353 - DEB SUBQ TISSUE 20 SQ CM/< Modifier: Quantity: 1 CPT4 Code: Description: ICD-10 Diagnosis Description I14.431 Non-pressure chronic ulcer of left ankle with fat layer exposed Modifier: Quantity: Physician Procedures CPT4 Code: 5400867 Description: 61950 - WC PHYS SUBQ TISS 20 SQ CM Modifier: Quantity: 1 CPT4 Code: Description: ICD-10 Diagnosis Description D32.671 Non-pressure chronic ulcer of left ankle with fat layer exposed Modifier: Quantity: Electronic Signature(s) Signed: 05/29/2021 6:42:49 PM By: Worthy Keeler PA-C Entered By: Worthy Keeler on 05/29/2021 18:42:49

## 2021-05-29 ENCOUNTER — Encounter: Payer: Self-pay | Admitting: Internal Medicine

## 2021-05-29 NOTE — Progress Notes (Addendum)
EDIN, KON ([id number]) Visit Report for 05/28/2021 Arrival Information Details Patient Name: Chad [name], Chad [name] Date of Service: 05/28/2021 10:45 AM Medical Record Number: [id number] Patient Account Number: [id number] Date of Birth/Sex: 04-22-1961 (60 y.o. M) Treating RN: Cornell Barman Primary Care Shaketa Serafin: SYSTEM, PCP Other Clinician: Referring Irasema Chalk: Clayborn Bigness Treating Vastie Douty/Extender: Skipper Cliche in Treatment: 41 Visit Information History Since Last Visit Added or deleted any medications: No Patient Arrived: Ambulatory Has Dressing in Place as Prescribed: Yes Arrival Time: 11:00 Pain Present Now: No Accompanied By: self Transfer Assistance: Manual Patient Identification Verified: Yes Secondary Verification Process Completed: Yes Patient Requires Transmission-Based No Precautions: Patient Has Alerts: Yes Patient Alerts: Patient on Blood Thinner ABI right 1.16 ABI left 1.14 Electronic Signature(s) Signed: 05/29/2021 9:48:11 AM By: Gretta Cool, BSN, RN, CWS, Kim RN, BSN Entered By: Gretta Cool, BSN, RN, CWS, Kim on 05/28/2021 11:00:48 Chad [name] ([id number]) -------------------------------------------------------------------------------- Encounter Discharge Information Details Patient Name: Chad [name] Date of Service: 05/28/2021 10:45 AM Medical Record Number: [id number] Patient Account Number: [id number] Date of Birth/Sex: 1960/10/16 (60 y.o. M) Treating RN: Cornell Barman Primary Care Richele Strand: SYSTEM, PCP Other Clinician: Referring Kymoni Monday: Clayborn Bigness Treating Helana Macbride/Extender: Skipper Cliche in Treatment: 53 Encounter Discharge Information Items Post Procedure Vitals Discharge Condition: Stable Unable to obtain vitals Reason: timing Ambulatory Status: Ambulatory Discharge Destination: Home Transportation: Private Auto Schedule Follow-up Appointment: Yes Clinical Summary of Care: Electronic Signature(s) Signed: 05/28/2021 4:44:25 PM By: Gretta Cool, BSN, RN,  CWS, Kim RN, BSN Entered By: Gretta Cool, BSN, RN, CWS, Kim on 05/28/2021 16:44:24 Chad [name] ([id number]) -------------------------------------------------------------------------------- Lower Extremity Assessment Details Patient Name: Chad [name] Date of Service: 05/28/2021 10:45 AM Medical Record Number: [id number] Patient Account Number: [id number] Date of Birth/Sex: 05/28/1961 (60 y.o. M) Treating RN: Cornell Barman Primary Care Daphna Lafuente: SYSTEM, PCP Other Clinician: Referring Fayetta Sorenson: Clayborn Bigness Treating Soundra Lampley/Extender: Skipper Cliche in Treatment: 41 Edema Assessment Assessed: [Left: Yes] [Right: No] Edema: [Left: N] [Right: o] Vascular Assessment Pulses: Dorsalis Pedis Palpable: [Left:Yes] Electronic Signature(s) Signed: 05/29/2021 9:48:11 AM By: Gretta Cool, BSN, RN, CWS, Kim RN, BSN Entered By: Gretta Cool, BSN, RN, CWS, Kim on 05/28/2021 11:16:36 Chad [name] ([id number]) -------------------------------------------------------------------------------- Multi Wound Chart Details Patient Name: Chad [name] Date of Service: 05/28/2021 10:45 AM Medical Record Number: [id number] Patient Account Number: [id number] Date of Birth/Sex: 1961/05/28 (60 y.o. M) Treating RN: Cornell Barman Primary Care Taylore Hinde: SYSTEM, PCP Other Clinician: Referring Fara Worthy: Clayborn Bigness Treating Kassy Mcenroe/Extender: Skipper Cliche in Treatment: 41 Vital Signs Height(in): 75 Pulse(bpm): 28 Weight(lbs): 160 Blood Pressure(mmHg): 118/66 Body Mass Index(BMI): 25 Temperature(F): 98.7 Respiratory Rate(breaths/min): 20 Photos: [N/A:N/A] Wound Location: Left, Medial Ankle N/A N/A Wounding Event: Gradually Appeared N/A N/A Primary Etiology: Venous Leg Ulcer N/A N/A Comorbid History: Anemia, Chronic Obstructive N/A N/A Pulmonary Disease (COPD), Hepatitis C, Received Chemotherapy Date Acquired: 07/12/2019 N/A N/A Weeks of Treatment: 41 N/A N/A Wound Status: Open N/A N/A Measurements L x W x D (cm)  1.4x0.9x0.1 N/A N/A Area (cm) : 0.99 N/A N/A Volume (cm) : 0.099 N/A N/A % Reduction in Area: 83.20% N/A N/A % Reduction in Volume: 83.20% N/A N/A Classification: Full Thickness Without Exposed N/A N/A Support Structures Exudate Amount: Medium N/A N/A Exudate Type: Serous N/A N/A Exudate Color: amber N/A N/A Wound Margin: Flat and Intact N/A N/A Granulation Amount: Medium (34-66%) N/A N/A Granulation Quality: Red N/A N/A Necrotic Amount: Medium (34-66%) N/A N/A Exposed Structures: Fat Layer (Subcutaneous Tissue): N/A N/A Yes Fascia: No Tendon: No Muscle: No Joint: No Bone: No Epithelialization: None N/A N/A Treatment Notes Electronic Signature(s)  Signed: 05/29/2021 9:48:11 AM By: Gretta Cool, BSN, RN, CWS, Kim RN, BSN Entered By: Gretta Cool, BSN, RN, CWS, Kim on 05/28/2021 11:40:23 Chad [name] ([id number]) -------------------------------------------------------------------------------- Multi-Disciplinary Care Plan Details Patient Name: Chad [name] Date of Service: 05/28/2021 10:45 AM Medical Record Number: [id number] Patient Account Number: [id number] Date of Birth/Sex: 1961-01-20 (60 y.o. M) Treating RN: Cornell Barman Primary Care Bryla Burek: SYSTEM, PCP Other Clinician: Referring [place] Warriner: Clayborn Bigness Treating Trinity Hyland/Extender: Skipper Cliche in Treatment: 41 Active Inactive Necrotic Tissue Nursing Diagnoses: Impaired tissue integrity related to necrotic/devitalized tissue Knowledge deficit related to management of necrotic/devitalized tissue Goals: Necrotic/devitalized tissue will be minimized in the wound bed Date Initiated: 05/20/2021 Target Resolution Date: 05/20/2021 Goal Status: Active Patient/caregiver will verbalize understanding of reason and process for debridement of necrotic tissue Date Initiated: 05/20/2021 Target Resolution Date: 05/20/2021 Goal Status: Active Interventions: Assess patient pain level pre-, during and post procedure and prior to  discharge Provide education on necrotic tissue and debridement process Treatment Activities: Apply topical anesthetic as ordered : 05/20/2021 Notes: Electronic Signature(s) Signed: 05/29/2021 9:48:11 AM By: Gretta Cool, BSN, RN, CWS, Kim RN, BSN Entered By: Gretta Cool, BSN, RN, CWS, Kim on 05/28/2021 11:40:03 Chad [name] ([id number]) -------------------------------------------------------------------------------- Pain Assessment Details Patient Name: Chad [name] Date of Service: 05/28/2021 10:45 AM Medical Record Number: [id number] Patient Account Number: [id number] Date of Birth/Sex: 1961-02-19 (60 y.o. M) Treating RN: Cornell Barman Primary Care Naleyah Ohlinger: SYSTEM, PCP Other Clinician: Referring Hillel Card: Clayborn Bigness Treating Waymond Meador/Extender: Skipper Cliche in Treatment: 41 Active Problems Location of Pain Severity and Description of Pain Patient Has Paino No Site Locations Pain Management and Medication Current Pain Management: Notes Patient denies pain at this time. Electronic Signature(s) Signed: 05/29/2021 9:48:11 AM By: Gretta Cool, BSN, RN, CWS, Kim RN, BSN Entered By: Gretta Cool, BSN, RN, CWS, Kim on 05/28/2021 11:12:14 Chad [name] ([id number]) -------------------------------------------------------------------------------- Patient/Caregiver Education Details Patient Name: Chad [name] Date of Service: 05/28/2021 10:45 AM Medical Record Number: [id number] Patient Account Number: [id number] Date of Birth/Gender: [birth year] (60 y.o. M) Treating RN: Cornell Barman Primary Care Physician: SYSTEM, PCP Other Clinician: Referring Physician: Clayborn Bigness Treating Physician/Extender: Skipper Cliche in Treatment: 56 Education Assessment Education Provided To: Patient Education Topics Provided Wound/Skin Impairment: Handouts: Caring for Your Ulcer Methods: Demonstration, Explain/Verbal Responses: State content correctly Electronic Signature(s) Signed: 05/29/2021 9:48:11 AM By: Gretta Cool,  BSN, RN, CWS, Kim RN, BSN Entered By: Gretta Cool, BSN, RN, CWS, Kim on 05/28/2021 16:43:39 Chad [name] ([id number]) -------------------------------------------------------------------------------- Wound Assessment Details Patient Name: Chad [name] Date of Service: 05/28/2021 10:45 AM Medical Record Number: [id number] Patient Account Number: [id number] Date of Birth/Sex: 04-19-1961 (60 y.o. M) Treating RN: Cornell Barman Primary Care Demetrius Barrell: SYSTEM, PCP Other Clinician: Referring Sirenia Whitis: Clayborn Bigness Treating Rogue Rafalski/Extender: Skipper Cliche in Treatment: 41 Wound Status Wound Number: 8 Primary Venous Leg Ulcer Etiology: Wound Location: Left, Medial Ankle Wound Open Wounding Event: Gradually Appeared Status: Date Acquired: 07/12/2019 Comorbid Anemia, Chronic Obstructive Pulmonary Disease (COPD), Weeks Of Treatment: 41 History: Hepatitis C, Received Chemotherapy Clustered Wound: No Photos Wound Measurements Length: (cm) 1.4 Width: (cm) 0.9 Depth: (cm) 0.1 Area: (cm) 0.99 Volume: (cm) 0.099 % Reduction in Area: 83.2% % Reduction in Volume: 83.2% Epithelialization: None Tunneling: No Wound Description Classification: Full Thickness Without Exposed Support Structures Wound Margin: Flat and Intact Exudate Amount: Medium Exudate Type: Serous Exudate Color: amber Foul Odor After Cleansing: No Slough/Fibrino Yes Wound Bed Granulation Amount: Medium (34-66%) Exposed Structure Granulation Quality: Red Fascia Exposed: No Necrotic Amount: Medium (34-66%) Fat Layer (Subcutaneous Tissue) Exposed: Yes Necrotic Quality: Adherent Slough  Tendon Exposed: No Muscle Exposed: No Joint Exposed: No Bone Exposed: No Treatment Notes Wound #8 (Ankle) Wound Laterality: Left, Medial Cleanser Normal Saline Discharge Instruction: Wash your hands with soap and water. Remove old dressing, discard into plastic bag and place into trash. Cleanse the wound with Normal Saline prior to  applying a clean dressing using gauze sponges, not tissues or cotton balls. Do not scrub or use excessive force. Pat dry using gauze sponges, not tissue or cotton balls. DAYVION, SANS ([id number]) Peri-Wound Care Desitin Maximum Strength Ointment 4 (oz) Discharge Instruction: Apply zinc periwound Topical Primary Dressing Hydrofera Blue Ready Transfer Foam, 2.5x2.5 (in/in) Discharge Instruction: Apply Hydrofera Blue Ready to wound bed as directed (EXTRA SENT WITH PATIENT, PLEASE USE) Secondary Dressing Kerlix 4.5 x 4.1 (in/yd) Discharge Instruction: Apply kerlix over ABD pad Secured With 73M [place] Surgical Tape, 2x2 (in/yd) Discharge Instruction: Secure kerlix Compression Wrap Compression Stockings Add-Ons Electronic Signature(s) Signed: 05/29/2021 9:48:11 AM By: Gretta Cool, BSN, RN, CWS, Kim RN, BSN Entered By: Gretta Cool, BSN, RN, CWS, Kim on 05/28/2021 11:15:40 Chad [name] ([id number]) -------------------------------------------------------------------------------- [place] Details Patient Name: Chad [name] Date of Service: 05/28/2021 10:45 AM Medical Record Number: [id number] Patient Account Number: [id number] Date of Birth/Sex: March 11, 1961 (60 y.o. M) Treating RN: Cornell Barman Primary Care Aribella Vavra: SYSTEM, PCP Other Clinician: Referring Rosanna Bickle: Clayborn Bigness Treating Makynzi Eastland/Extender: Skipper Cliche in Treatment: 41 Vital Signs Time Taken: 11:00 Temperature (F): 98.7 Height (in): 67 Pulse (bpm): 83 Weight (lbs): 160 Respiratory Rate (breaths/min): 20 Body Mass Index (BMI): 25.1 Blood Pressure (mmHg): 118/66 Reference Range: 80 - 120 mg / dl Electronic Signature(s) Signed: 05/29/2021 9:48:11 AM By: Gretta Cool, BSN, RN, CWS, Kim RN, BSN Entered By: Gretta Cool, BSN, RN, CWS, Kim on 05/28/2021 11:11:48

## 2021-06-03 ENCOUNTER — Non-Acute Institutional Stay: Payer: Medicaid Other | Admitting: Nurse Practitioner

## 2021-06-03 ENCOUNTER — Encounter: Payer: Self-pay | Admitting: Nurse Practitioner

## 2021-06-03 VITALS — BP 108/70 | HR 78 | Temp 98.7°F | Resp 18 | Wt 162.5 lb

## 2021-06-03 DIAGNOSIS — Z515 Encounter for palliative care: Secondary | ICD-10-CM

## 2021-06-03 DIAGNOSIS — C661 Malignant neoplasm of right ureter: Secondary | ICD-10-CM

## 2021-06-03 DIAGNOSIS — R630 Anorexia: Secondary | ICD-10-CM

## 2021-06-03 DIAGNOSIS — G894 Chronic pain syndrome: Secondary | ICD-10-CM

## 2021-06-03 NOTE — Progress Notes (Addendum)
[place] Consult Note Telephone: [phone]  Fax: [phone]    Date of encounter: 06/03/21 8:38 PM PATIENT NAME: Chad [name] [id number]   [id number] (home)  DOB: Dec 09, 1960 MRN: [id number] PRIMARY CARE PROVIDER:    Dr [facility]  RESPONSIBLE PARTY:    Contact Information     Name Relation Home Work Mobile   Cipriano, [place]   [phone]      I met face to face with patient in facility. Palliative Care was asked to follow this patient by consultation request of  Dr Nicole Kindred to address advance care planning and complex medical decision making. This is a follow up visit.                                  ASSESSMENT AND PLAN / RECOMMENDATIONS: Symptom Management/Plan: 1. ACP: DNR; treat what is treatable including hospitalizations if necessary   2.Anorexia secondary to protein calorie malnutrition related to cancer improving ongoing. Current weight 161.8 lbs. Continue to monitor daily weights, supplements, supportive measures and courage to eat   3. Chronic pain reviewed, discussed and educated continue to monitor on pain scale, monitor efficacy vs adverse side effects.Will continue to monitor; encouraged to ask when in pain, Continue to follow by psychotherapy and psychiatry for counseling; supportive services. Continue with Oxycodone 46m q6 prn   4. Palliative care encounter; Palliative medicine team will continue to support patient, patient's family, and medical team. Visit consisted of counseling and education dealing with the complex and emotionally intense issues of symptom management and palliative care in the setting of serious and potentially life-threatening illness  Follow up Palliative Care Visit: Palliative care will continue to follow for complex medical decision making, advance care planning, and clarification of goals. Return 4 weeks or prn.  I  spent 67 minutes providing this consultation. More than 50% of the time in this consultation was spent in counseling and care coordination.  PPS: 50%  Chief Complaint: Follow up palliative consult for complex medical decision making  HISTORY OF PRESENT ILLNESS:  [name] a 60y.o. year old male  with multiple medical problems including   late onset CVA, urothelial cancer, prostate cancer, chronic hepatitis C, hyperlipidemia, colon surgery, protein calorie malnutrition, anxiety, depression. [facility]. Mr. LAldermantransfers himself to w/c, requires assistance for ADL's. Mr. LRiddlefeeds himself with fair to declined appetite. Current weight 162.5 lbs with bmi 25.4 with stable weight. 05/28/2021 care plan meeting done,  no new changes. He continues to be followed by wound at the facility in addition to psychiatry with last visit 05/09/2021 for recurrent depression with mood disorder prescribed escitalopram and remeron with no changes. Mr. LOdohertycontinues to smoke daily. 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 No recent falls, infections, hospitalization. Saw Dr BRogue Bussingfor High-grade urothelial cancer/cytology; likely of the right renal pelvis /upper ureter, proceeded with keytruda infusion. IDA stable. Bilateral LE ulcers s/p PTCA seen at wound care today. Staff endorses no other changes or concerns, pain appears controlled. I visited and observed [name] he was sitting in the w/c in his room. We talked about how he has been feeling,  symptoms, ros, appetite, weight, wounds with wound care following. We talked about functional abilities. [name] continues to go to smoking patio to smoke. Encouraged smoking  cessation, declined. We talked about upcoming colonscopy this week, last Oncology appointment with treatment. We talked about residing at [facility] for L[facility]. We talked about coping strategies. Reviewed medical goals. No new changes, pain managed with current regimen. Therapeutic listening, emotional support provided. [name] talked about his son, he has not seen in a while. [name] endorses his son is working 7 days a week. [name] will update son when he talks to him about pc visit. Questions answered. Updated staff, no new recommendations  History obtained from review of EMR, discussion with facilty staff and [name].  I reviewed available labs, medications, imaging, studies and related documents from the EMR.  Records reviewed and summarized above.   ROS Full 10 system review of systems performed and negative with exception of: as per HPI.   Physical Exam: Constitutional: NAD General: debilitated, pleasant male EYES:  lids intact ENMT: oral mucous membranes moist CV: S1S2, RRR, +BLE edema Pulmonary: LCTA, no increased work of breathing, no cough, room air Abdomen: normo-active BS + 4 quadrants, soft and non tender MSK: w/c dependent Skin: warm and dry Neuro:  + generalized weakness,  no cognitive impairment Psych: non-anxious affect, A and O x 3  Questions and concerns were addressed. Provided general support and encouragement, no other unmet needs identified   Thank you for the opportunity to participate in the care of [name].  The palliative care team will continue to follow. Please call our office at [phone] if we can be of additional assistance.   This chart was dictated using voice recognition software.  Despite best efforts to proofread,  errors can occur which can change the documentation meaning.   Creighton Longley Ihor Gully, NP

## 2021-06-04 ENCOUNTER — Encounter: Payer: Self-pay | Admitting: General Surgery

## 2021-06-04 ENCOUNTER — Ambulatory Visit: Payer: Medicaid Other | Admitting: Physician Assistant

## 2021-06-04 ENCOUNTER — Other Ambulatory Visit: Payer: Self-pay

## 2021-06-05 ENCOUNTER — Ambulatory Visit: Admission: RE | Admit: 2021-06-05 | Payer: Medicaid Other | Source: Home / Self Care | Admitting: General Surgery

## 2021-06-05 ENCOUNTER — Encounter: Payer: Self-pay | Admitting: Anesthesiology

## 2021-06-05 ENCOUNTER — Encounter: Admission: RE | Payer: Self-pay | Source: Home / Self Care

## 2021-06-05 HISTORY — DX: Malignant neoplasm of right ureter: C66.1

## 2021-06-05 SURGERY — COLONOSCOPY WITH PROPOFOL
Anesthesia: General

## 2021-06-05 NOTE — Anesthesia Preprocedure Evaluation (Deleted)
Anesthesia Evaluation  Patient identified by MRN, date of birth, ID band Patient awake    Reviewed: Allergy & Precautions, NPO status , Patient's Chart, lab work & pertinent test results  History of Anesthesia Complications Negative for: history of anesthetic complications  Airway        Dental   Pulmonary COPD, Current Smoker,           Cardiovascular + Peripheral Vascular Disease       Neuro/Psych Anxiety Depression   Nerve pain CVA    GI/Hepatic GERD  ,(+) Hepatitis -, C  Endo/Other    Renal/GU   Hydronephrosis with ureteral stricture, s/p multiple stents     Musculoskeletal   Abdominal   Peds  Hematology  (+) anemia ,   Bladder cancer Ureteral cancer, right Prostate cancer Colon cancer   Anesthesia Other Findings EKG 11/18/19 NSR  Reproductive/Obstetrics                             Anesthesia Physical Anesthesia Plan  ASA:   Anesthesia Plan:    Post-op Pain Management:    Induction:   PONV Risk Score and Plan:   Airway Management Planned:   Additional Equipment:   Intra-op Plan:   Post-operative Plan:   Informed Consent:   Plan Discussed with:   Anesthesia Plan Comments: (NO SHOW - case cancelled)        Anesthesia Quick Evaluation

## 2021-06-11 ENCOUNTER — Other Ambulatory Visit: Payer: Self-pay | Admitting: Internal Medicine

## 2021-06-11 ENCOUNTER — Encounter: Payer: Medicaid Other | Admitting: Physician Assistant

## 2021-06-11 ENCOUNTER — Other Ambulatory Visit: Payer: Self-pay

## 2021-06-11 DIAGNOSIS — I872 Venous insufficiency (chronic) (peripheral): Secondary | ICD-10-CM | POA: Diagnosis not present

## 2021-06-11 NOTE — Progress Notes (Addendum)
ROGELIO, WAYNICK ([id number]) Visit Report for 06/11/2021 Chief Complaint Document Details Patient Name: Chad [name], Chad [name] Date of Service: 06/11/2021 2:45 PM Medical Record Number: [id number] Patient Account Number: [id number] Date of Birth/Sex: 1961-03-08 (60 y.o. M) Treating RN: Carlene Coria Primary Care Provider: SYSTEM, PCP Other Clinician: Referring Provider: Clayborn Bigness Treating Provider/Extender: Skipper Cliche in Treatment: 43 Information Obtained from: Patient Chief Complaint Left ankle and right foot ulcers Electronic Signature(s) Signed: 06/11/2021 3:12:47 PM By: Worthy Keeler PA-C Previous Signature: 06/11/2021 2:53:08 PM Version By: Worthy Keeler PA-C Entered By: Worthy Keeler on 06/11/2021 15:12:47 Chad [name] ([id number]) -------------------------------------------------------------------------------- Debridement Details Patient Name: Chad [name] Date of Service: 06/11/2021 2:45 PM Medical Record Number: [id number] Patient Account Number: [id number] Date of Birth/Sex: 12-04-1960 (60 y.o. M) Treating RN: Carlene Coria Primary Care Provider: SYSTEM, PCP Other Clinician: Referring Provider: Clayborn Bigness Treating Provider/Extender: Skipper Cliche in Treatment: 43 Debridement Performed for Wound #9 Right Toe Second Assessment: Performed By: Physician Tommie Sams., PA-C Debridement Type: Debridement Level of Consciousness (Pre- Awake and Alert procedure): Pre-procedure Verification/Time Out Yes - 15:19 Taken: Start Time: 15:19 Pain Control: Lidocaine 4% Topical Solution Total Area Debrided (L x W): 0.6 (cm) x 0.8 (cm) = 0.48 (cm) Tissue and other material Viable, Non-Viable, Bone, Slough, Subcutaneous, Skin: Dermis , Skin: Epidermis, Slough debrided: Level: Skin/Subcutaneous Tissue/Muscle/Bone Debridement Description: Excisional Instrument: Curette Specimen: Tissue Culture Number of Specimens Taken: 1 Bleeding: Minimum Hemostasis Achieved:  Pressure End Time: 15:23 Procedural Pain: 0 Post Procedural Pain: 0 Response to Treatment: Procedure was tolerated well Level of Consciousness (Post- Awake and Alert procedure): Post Debridement Measurements of Total Wound Length: (cm) 0.6 Stage: Category/Stage IV Width: (cm) 0.8 Depth: (cm) 0.2 Volume: (cm) 0.075 Character of Wound/Ulcer Post Debridement: Improved Post Procedure Diagnosis Same as Pre-procedure Electronic Signature(s) Signed: 06/11/2021 4:13:23 PM By: Worthy Keeler PA-C Signed: 06/14/2021 10:13:49 AM By: Carlene Coria RN Entered By: Worthy Keeler on 06/11/2021 16:13:23 Chad [name] ([id number]) -------------------------------------------------------------------------------- HPI Details Patient Name: Chad [name] Date of Service: 06/11/2021 2:45 PM Medical Record Number: [id number] Patient Account Number: [id number] Date of Birth/Sex: [birth year] (60 y.o. M) Treating RN: Carlene Coria Primary Care Provider: SYSTEM, PCP Other Clinician: Referring Provider: Clayborn Bigness Treating Provider/Extender: Skipper Cliche in Treatment: 43 History of Present Illness HPI Description: 10/08/18 on evaluation today patient actually presents to our office for initial evaluation concerning wounds that he has of the bilateral lower extremities. 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 He also has bilateral venous stasis. He apparently has been homeless currently following discharge from the hospital apparently he has been placed at [facility] which is is a skilled nursing facility locally. Nonetheless fortunately he does not show any signs of infection at this time which is good news. In fact several of the wound actually appears to be showing some signs of improvement already in my pinion. There are a couple  areas in the left leg in particular there likely gonna require some sharp debridement to help clear away some necrotic tissue and help with more sufficient healing. No fevers, chills, nausea, or vomiting noted at this time. 10/15/18 on evaluation today patient actually appears to be doing very well in regard to his bilateral lower extremities. He's been tolerating the dressing changes without complication. Fortunately there does not appear to be any evidence of active infection  at this time which is great news. Overall I'm actually very pleased with how this has progressed in just one visits time. Readmission: 08/14/2020 upon evaluation today patient presents for re-evaluation here in our clinic. He is having issues with his left ankle region as well as his right toe and his right heel. He tells me that the toe and heel actually began as a area that was itching that he was scratching and then subsequently opened up into wounds. These may have been abscess areas I presume based on what I am seeing currently. With regard to his left ankle region he tells me this was a similar type occurrence although he does have venous stasis this very well may be more of a venous leg ulcer more than anything. Nonetheless I do believe that the patient would benefit from appropriate and aggressive wound care to try to help get things under better control here. He does have history of a stroke on the left side affecting him to some degree there that he is able to stand although he does have some residual weakness. Otherwise again the patient does have chronic venous insufficiency as previously noted. His arterial studies most recently obtained showed that he had an ABI on the right of 1.16 with a TBI of 0.52 and on the left and ABI of 1.14 with a TBI of 0.81. That was obtained on 06/19/2020. 08/28/2020 upon evaluation today patient appears to be doing decently well in regard to his wounds in general. He has been tolerating  the dressing changes without complication. Fortunately there does not appear to be any signs of active infection which is great news. With that being said I think the [facility] is doing a good job I would recommend that we likely continue with that currently. 09/11/2020 upon evaluation today patient's wounds did not appear to be doing too poorly but again he is not really showing signs of significant improvement with regard to any of the wounds on the right. None of them have Hydrofera Blue on them I am not exactly sure why this is not being followed as the facility did not contact us to let us know of any issues with obtaining dressings or otherwise. With that being said he is supposed to be using Hydrofera Blue on both of the wounds on the right foot as well as the ankle wound on the left side. 09/18/2020 upon evaluation today patient appears to be doing poorly with regard to his wounds. Again right now the left ankle in particular showed signs of extreme maceration. Apparently he was told by someone with staff at [facility] they could not get the [facility]. With that being said this is something that is never been relayed to [place] one way or another. Also the patient subsequently has not supposed to have a border gauze dressing on. He should have an ABD pad and roll gauze to secure as this drains much too much just to have a border gauze dressing to cover. Nonetheless the fact that they are not using the appropriate dressing is directly causing deterioration of the left ankle wound it is significantly worse today compared to what it was previous. I did attempt to call [facility] while the patient was here I called three times and got no one to even pick up the phone. After this I had my for an office coordinator call and she was able to finally get through and leave a message with the D ON as of  dictation of this note which is roughly about an hour and a half later I still  have not been able to speak with anyone at the facility. 09/25/2020 upon evaluation today patient actually showing signs of good improvement which is excellent news. He has been tolerating the dressing changes without complication. Fortunately there is no signs of active infection which is great news. No fevers, chills, nausea, vomiting, or diarrhea. I do feel like the facility has been doing a much better job at taking care of him as far as the dressings are concerned. However the director of nursing never did call me back. 10/09/2020 upon evaluation today patient appears to be doing well with regard to his wound. The toe ulcer did require some debridement but the other 2 areas actually appear to be doing quite well. 10/19/2020 upon evaluation today patient actually appears to be doing very well in regard to his wounds. In fact the heel does appear to be completely healed. The toe is doing better in the medial ankle on the left is also doing better. Overall I think he is headed in the right direction. 10/26/2020 upon evaluation today patient appears to be doing well with regard to his wound. He is showing signs of improvement which is great news and overall I am very pleased with where things stand today. No fevers, chills, nausea, vomiting, or diarrhea. 11/02/2020 upon evaluation today patient appears to be doing well with regard to his wounds. He has been tolerating the dressing changes without complication overall I am extremely pleased with where things stand today. He in regard to the toe is almost completely healed and the medial ankle on the left is doing much better. 11/09/2020 upon evaluation today patient appears to be doing a little poorly in regard to his left medial ankle ulcer. Fortunately there does not appear to be any signs of systemic infection but unfortunately locally he does appear to be infected in fact he has blue-green drainage consistent with Pseudomonas. VINAY, ERTL  ([id number]) 11/16/2020 upon evaluation today patient appears to be doing well with regard to his wound. It actually appears to be doing better. I did place him on gentamicin cream since the Cipro was actually resistant even though he was positive for Pseudomonas on culture. Overall I think that he does seem to be doing better though I am unsure whether or not they have actually been putting the cream on. The patient is not sure that we did talk to the nurse directly and she was going to initiate that treatment. Fortunately there does not appear to be any signs of active infection at this time. No fevers, chills, nausea, vomiting, or diarrhea. 4/28; the area on the right second toe is close to healed. Left medial ankle required debridement 12/07/2020 upon evaluation today patient appears to be doing well with regard to his wounds. In fact the right second toe appears to be completely healed which is great news. Fortunately there does not appear to be any signs of active infection at this time which is also great news. I think we can probably discontinue the gentamicin on top of everything else. 12/14/2020 upon evaluation today patient appears to be doing well with regard to his wound. He is making good progress and overall very pleased with where things stand today. There is no signs of active infection at this time which is great news. 12/28/2020 upon evaluation today patient appears to be doing well with regard to his wounds. He has been tolerating  the dressing changes without complication. Fortunately there is no signs of active infection at this time. No fevers, chills, nausea, vomiting, or diarrhea. 12/28/2020 upon evaluation today patient's wound bed actually showed signs of excellent improvement. He has great epithelization and granulation I do not see any signs of infection overall I am extremely pleased with where things stand at this point. No fevers, chills, nausea, vomiting,  or diarrhea. 01/11/2021 upon evaluation today patient appears to be doing well with regard to his wound on his leg. He has been tolerating the dressing changes without complication. Fortunately there does not appear to be any signs of active infection which is great news. No fevers, chills, nausea, vomiting, or diarrhea. 01/25/2021 upon evaluation today patient appears to be doing well with regard to his wound. He has been tolerating the dressing changes without complication. Fortunately the collagen seems to be doing a great job which is excellent news. No fevers, chills, nausea, vomiting, or diarrhea. 02/08/2021 upon evaluation today patient's wound is actually looking a little bit worse especially in the periwound compared to previous. Fortunately there does not appear to be any signs of infection which is great news with that being said he does have some irritation around the periphery of the wound which has me more concerned. He actually had a dressing on that had not been changed in 3 days. He also is supposed to have daily dressing changes. With regard to the dressing applied he had a silver alginate dressing and silver collagen is what is recommended and ordered. He also had no Desitin around the edges of the wound in the periwound region although that is on the order inspect to be done as well. In general I was very concerned I did contact [facility] actually spoke with Magda Paganini who is the scheduling individual and subsequently she stated that she would pass the information to the D ON apparently the D ON was not available to talk to me when I call today. 02/18/2021 upon evaluation today patient's wound is actually showing signs of improvement. Fortunately there does not appear to be any evidence of infection which is great news overall I am extremely pleased with where things stand today. No fevers, chills, nausea, vomiting, or diarrhea. 8/3; patient presents for 1 week follow-up. He has  no issues or complaints today. He denies signs of infection. 03/11/2021 upon evaluation today patient appears to be doing well with regard to his wound. He does have a little bit of slough noted on the surface of the wound but fortunately there does not appear to be any signs of active infection at this time. No fevers, chills, nausea, vomiting, or diarrhea. 03/18/2021 upon evaluation today patient appears to be doing well with regard to his wound. He has been tolerating the dressing changes without complication. There was a little irritation more proximal to where the wound was that was not noted last week but nonetheless this is very superficial just seems to be more irritation we just need to make sure to put a good amount of the zinc over the area in my opinion. Otherwise he does not seem to be doing significantly worse at all which is great news. 03/25/2021 upon evaluation today patient appears to be doing well with regard to his wound. He is going require some sharp debridement today to clear with some of the necrotic debris. I did perform this today without complication postdebridement wound bed appears to be doing much better this is great news. 04/08/2021 upon  evaluation today patient appears to be doing decently well in regard to his wound although the overall measurement is not significantly smaller compared to previous. It is gone down a little bit but still the facility continues to not really put the appropriate dressings in place in fact he was supposed to have collagen we think he probably had more of an allergy to At this point. Fortunately there does not appear to be any signs of active infection systemically though locally I do not see anything on initial visualization either as far as erythema or warmth. 04/15/2021 upon evaluation today patient appears to be doing well with regard to his wound. He is actually showing signs of improvement. I did place him on antibiotics last week,  Cipro. He has been taking that 2 times a day and seems to be tolerating it very well. I do not see any evidence of worsening and in fact the overall appearance of the wound is smaller today which is also great news. 9/26; left medial ankle chronic venous insufficiency wound is improved. Using Hydrofera Blue 10/10; left medial ankle chronic venous insufficiency. Wound has not changed much in appearance completely nonviable surface. Apparently there have been problems getting the right product on the wound at the facility although he came in with [facility] on today 05/14/2021 upon evaluation today patient appears to be doing well with regard to his wound. I think he is making progress here which is good news. Fortunately there does not appear to be any signs of active infection at this time. No fevers, chills, nausea, vomiting, or diarrhea. 05/20/2021 upon evaluation today patient appears to be doing well with regard to his wound. He is showing signs of good improvement which is great news. There does not appear to be any evidence of active infection which is also excellent news. No fevers, chills, nausea, vomiting, or diarrhea. 05/28/2021 upon evaluation today patient appears to be doing quite well. There does not appear to be any signs of active infection at this time which is great news. Overall I am extremely pleased with where things stand today. I think he is headed in the right direction. 06/11/2021 upon evaluation today patient appears to be doing well with regard to his left ankle ulcer and poorly in regard to the toe ulcer on the second toe right foot. This appears to show signs of joint exposure. Apparently this has been present for 1 to 2 months although he kept [facility] ([id number]) forgetting to tell me about it. That is unfortunate as right now it definitely appears to be doing significantly worse than what I would like to see. There does not appear to be any signs of active  infection systemically though locally I am concerned about the possibility of infection the toe is quite red. Again no one from the facility ever contacted [place] to advise that this was going on in the interim either. Electronic Signature(s) Signed: 06/11/2021 4:11:15 PM By: Worthy Keeler PA-C Entered By: Worthy Keeler on 06/11/2021 16:11:14 Chad [name] ([id number]) -------------------------------------------------------------------------------- Physical Exam Details Patient Name: Chad [name] Date of Service: 06/11/2021 2:45 PM Medical Record Number: [id number] Patient Account Number: [id number] Date of Birth/Sex: 11-Feb-1961 (60 y.o. M) Treating RN: Carlene Coria Primary Care Provider: SYSTEM, PCP Other Clinician: Referring Provider: Clayborn Bigness Treating Provider/Extender: Skipper Cliche in Treatment: 37 Constitutional Well-nourished and well-hydrated in no acute distress. Respiratory normal breathing without difficulty. Psychiatric this patient is able to make decisions and demonstrates good insight into  disease process. Alert and Oriented x 3. pleasant and cooperative. Notes Upon inspection patient's wound on the left ankle seems to be doing quite well. On the right second toe this unfortunately is not doing nearly as well and I am much more concerned about what I see here. I did perform debridement and remove some of the what appears to be necrotic bone with a curette this is good to be sent for pathology. Nonetheless I am concerned about the fact that the patient may end up losing this toe I am going to go and place him on doxycycline though I think risk of amputation is quite significant. Electronic Signature(s) Signed: 06/11/2021 4:11:52 PM By: Worthy Keeler PA-C Entered By: Worthy Keeler on 06/11/2021 16:11:51 Chad [name] ([id number]) -------------------------------------------------------------------------------- Physician Orders Details Patient Name:  Chad [name] Date of Service: 06/11/2021 2:45 PM Medical Record Number: [id number] Patient Account Number: [id number] Date of Birth/Sex: 04-11-1961 (60 y.o. 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 Bathing/ Shower/ Hygiene o May shower; gently cleanse wound with antibacterial soap, rinse and pat dry prior to dressing wounds Edema Control - Lymphedema / Segmental Compressive Device / Other o Elevate legs to the level of the heart and pump ankles as often as possible o Elevate leg(s) parallel to the floor when sitting. o DO YOUR BEST to sleep in the bed at night. DO NOT sleep in your recliner. Long hours of sitting in a recliner leads to swelling of the legs and/or potential wounds on your backside. Wound Treatment Wound #8 - Ankle Wound Laterality: Left, Medial Cleanser: Normal Saline 3 x Per Week/30 Days Discharge Instructions: Wash your hands with soap and water. Remove old dressing, discard into plastic bag and place into trash. Cleanse the wound with Normal Saline prior to applying a clean dressing using gauze sponges, not tissues or cotton balls. Do not scrub or use excessive force. Pat dry using gauze sponges, not tissue or cotton balls. Peri-Wound Care: Desitin Maximum Strength Ointment 4 (oz) 3 x Per Week/30 Days Discharge Instructions: Apply zinc periwound Primary Dressing: Hydrofera Blue Ready Transfer Foam, 2.5x2.5 (in/in) 3 x Per Week/30  Days Discharge Instructions: Apply Hydrofera Blue Ready to wound bed as directed (EXTRA SENT WITH PATIENT, PLEASE USE) Secondary Dressing: Kerlix 4.5 x 4.1 (in/yd) 3 x Per Week/30 Days Discharge Instructions: Apply kerlix over ABD pad Secured With: 4M Medipore H Soft Cloth Surgical Tape, 2x2 (in/yd) 3 x Per Week/30 Days Discharge Instructions: Secure kerlix Wound #9 - Toe Second Wound Laterality: Right Cleanser: Normal Saline 3 x Per Week/30 Days Discharge Instructions: Wash your hands with soap and water. Remove old dressing, discard into plastic bag and place into trash. Cleanse the wound with Normal Saline prior to applying a clean dressing using gauze sponges, not tissues or cotton balls. Do not scrub or use excessive force. Pat dry using gauze sponges, not tissue or cotton balls. Peri-Wound Care: Desitin Maximum Strength Ointment 4 (oz) 3 x Per Week/30 Days Discharge Instructions: Apply zinc periwound Primary Dressing: Hydrofera Blue Ready Transfer Foam, 2.5x2.5 (in/in) 3 x Per Week/30 Days Discharge  Instructions: Apply Hydrofera Blue Ready to wound bed as directed (EXTRA SENT WITH PATIENT, PLEASE USE) Secondary Dressing: Coverlet Latex-Free Fabric Adhesive Dressings 3 x Per Week/30 Days Discharge Instructions: 1.5 x 2 Secured With: 9M Medipore H Soft Cloth Surgical Tape, 2x2 (in/yd) 3 x Per Week/30 Days CYLAN, BORUM ([id number]) Discharge Instructions: Secure kerlix Laboratory o Bacteria identified in Tissue by Biopsy culture (MICRO) oooo LOINC Code: 62703-5 oooo Convenience Name: Biopsy specimen culture Radiology o X-ray, toes right foot - non healing wound with bone exposed 2nd toe right foot to be performed at [facility] Patient Medications Allergies: penicillin Notifications Medication Indication Start End doxycycline hyclate 06/12/2021 DOSE 1 - oral 100 mg capsule - 1 capsule oral take 2 times per day for 30 days Electronic Signature(s) Signed:  07/03/2021 1:44:34 PM By: Carlene Coria RN Signed: 07/19/2021 12:11:03 PM By: Worthy Keeler PA-C Previous Signature: 06/11/2021 4:51:13 PM Version By: Worthy Keeler PA-C Previous Signature: 06/14/2021 10:13:49 AM Version By: Carlene Coria RN Entered By: Carlene Coria on 07/01/2021 13:53:45 Chad [name] ([id number]) -------------------------------------------------------------------------------- Prescription 06/11/2021 Patient Name: Chad [name] Provider: Jeri Cos PA-C Date of Birth: 08/20/1960 NPI#: [id number] Sex: M DEA#: [id number] Phone #: [phone] License #: Patient Address: [facility] [place], [place] [address] [address] [place], [place] [address] [phone] Allergies penicillin Medication Medication: Route: Strength: Form: doxycycline hyclate 100 mg capsule oral 100 mg capsule Class: TETRACYCLINE ANTIBIOTICS Dose: Frequency / Time: Indication: 1 1 capsule oral take 2 times per day for 30 days Number of Refills: Number of Units: 0 Sixty (60) Generic Substitution: Start Date: End Date: One Time Use: Substitution Permitted 50/93/2671 No Note to Pharmacy: Hand Signature: Date(s): Electronic Signature(s) Signed: 07/03/2021 1:44:34 PM By: Carlene Coria RN Signed: 07/19/2021 12:11:03 PM By: Worthy Keeler PA-C Previous Signature: 06/11/2021 4:51:13 PM Version By: Worthy Keeler PA-C Previous Signature: 06/14/2021 10:13:49 AM Version By: Carlene Coria RN Entered By: Carlene Coria on 07/01/2021 13:53:46 Chad [name] ([id number]) --------------------------------------------------------------------------------  Problem List Details Patient Name: Chad [name] Date of Service: 06/11/2021 2:45 PM Medical Record Number: [id number] Patient Account Number: [id number] Date of Birth/Sex: January 25, 1961 (60 y.o. 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 08/14/2020 No Yes non-dominant side Inactive Problems ICD-10 Code Description Active Date Inactive Date L97.412 Non-pressure chronic ulcer of right heel and midfoot with fat layer exposed 08/14/2020 08/14/2020 L97.512 Non-pressure chronic ulcer of other part of right foot with fat layer exposed 08/14/2020 08/14/2020 Resolved Problems Electronic Signature(s) Signed: 06/11/2021 3:33:01 PM By: Worthy Keeler PA-C Previous Signature: 06/11/2021 3:12:27 PM Version By: Worthy Keeler PA-C Previous Signature: 06/11/2021 2:52:55 PM Version By: Worthy Keeler PA-C Entered By: Worthy Keeler on 06/11/2021 15:33:01 Chad [name] ([id number]) -------------------------------------------------------------------------------- Progress Note Details Patient Name: Chad [name] Date of Service: 06/11/2021 2:45 PM Medical Record Number: [id number] Patient Account Number: [id number] Date of Birth/Sex: Dec 23, 1960 (60 y.o. M) Treating RN: Carlene Coria Primary Care Provider: SYSTEM, PCP Other Clinician: Referring Provider: Clayborn Bigness Treating Provider/Extender: Skipper Cliche in Treatment: [phone]  Subjective Chief Complaint Information obtained from Patient Left ankle and right foot ulcers History of Present Illness (HPI) 10/08/18 on evaluation today patient actually presents to our office for initial evaluation concerning wounds that he has of the bilateral lower extremities. 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 He also has bilateral venous stasis. He apparently has been homeless currently following discharge from the hospital apparently he has been placed at [facility] which is is a skilled nursing facility locally. Nonetheless fortunately he does not show any signs of infection at this time which is good news. In fact several of the wound actually appears to be showing some signs of improvement already in my pinion. There are a couple areas in the left leg in particular there likely gonna require some sharp debridement to help clear away some necrotic tissue and help with more sufficient healing. No fevers, chills, nausea, or vomiting noted at this time. 10/15/18 on evaluation today patient actually appears to be doing very well in regard to his bilateral lower extremities. He's been tolerating the dressing changes without complication. Fortunately there does not appear to be any evidence of active infection at this time which is great news. Overall I'm actually very pleased with how this has progressed in just one visits time. Readmission: 08/14/2020 upon evaluation today patient presents for re-evaluation here in our clinic. He is having issues with his left ankle region as well as his right toe and his right heel. He tells me that the toe and heel actually began as a area that was itching that he was scratching and then subsequently opened up into wounds. These may have been abscess areas I presume based on what I am seeing currently. With regard to his left ankle region he tells me this was a similar type occurrence although he does have venous stasis this very well may be more of a venous leg ulcer more than anything. Nonetheless I do believe that the patient would benefit from appropriate and aggressive wound care to try to  help get things under better control here. He does have history of a stroke on the left side affecting him to some degree there that he is able to stand although he does have some residual weakness. Otherwise again the patient does have chronic venous insufficiency as previously noted. His arterial studies most recently obtained showed that he had an ABI on the right of 1.16 with a TBI of 0.52 and on the left and ABI of 1.14 with a TBI of 0.81. That was obtained on 06/19/2020. 08/28/2020 upon evaluation today patient appears to be doing decently well in regard to his wounds in general. He has been tolerating the dressing changes without complication. Fortunately there does not appear to be any signs of active infection which is great news. With that being said I think the [facility] is doing a good job I would recommend that we likely continue with that currently. 09/11/2020 upon evaluation today patient's wounds did not appear to be doing too poorly but again he is not really showing signs of significant improvement with regard to any of the wounds on the right. None of them have Hydrofera Blue on them I am not exactly sure why this is not being followed as the facility did not contact us to let us know of any issues with obtaining  dressings or otherwise. With that being said he is supposed to be using Hydrofera Blue on both of the wounds on the right foot as well as the ankle wound on the left side. 09/18/2020 upon evaluation today patient appears to be doing poorly with regard to his wounds. Again right now the left ankle in particular showed signs of extreme maceration. Apparently he was told by someone with staff at [facility] they could not get the [facility]. With that being said this is something that is never been relayed to [place] one way or another. Also the patient subsequently has not supposed to have a border gauze dressing on. He should have an ABD pad and roll gauze to secure  as this drains much too much just to have a border gauze dressing to cover. Nonetheless the fact that they are not using the appropriate dressing is directly causing deterioration of the left ankle wound it is significantly worse today compared to what it was previous. I did attempt to call [facility] while the patient was here I called three times and got no one to even pick up the phone. After this I had my for an office coordinator call and she was able to finally get through and leave a message with the D ON as of dictation of this note which is roughly about an hour and a half later I still have not been able to speak with anyone at the facility. 09/25/2020 upon evaluation today patient actually showing signs of good improvement which is excellent news. He has been tolerating the dressing changes without complication. Fortunately there is no signs of active infection which is great news. No fevers, chills, nausea, vomiting, or diarrhea. I do feel like the facility has been doing a much better job at taking care of him as far as the dressings are concerned. However the director of nursing never did call me back. 10/09/2020 upon evaluation today patient appears to be doing well with regard to his wound. The toe ulcer did require some debridement but the other 2 areas actually appear to be doing quite well. 10/19/2020 upon evaluation today patient actually appears to be doing very well in regard to his wounds. In fact the heel does appear to be completely healed. The toe is doing better in the medial ankle on the left is also doing better. Overall I think he is headed in the right direction. 10/26/2020 upon evaluation today patient appears to be doing well with regard to his wound. He is showing signs of improvement which is great news and overall I am very pleased with where things stand today. No fevers, chills, nausea, vomiting, or diarrhea. 11/02/2020 upon evaluation today patient appears to  be doing well with regard to his wounds. He has been tolerating the dressing changes without complication overall I am extremely pleased with where things stand today. He in regard to the toe is almost completely healed and the medial [facility], [place] ([id number]) ankle on the left is doing much better. 11/09/2020 upon evaluation today patient appears to be doing a little poorly in regard to his left medial ankle ulcer. Fortunately there does not appear to be any signs of systemic infection but unfortunately locally he does appear to be infected in fact he has blue-green drainage consistent with Pseudomonas. 11/16/2020 upon evaluation today patient appears to be doing well with regard to his wound. It actually appears to be doing better. I did place him on gentamicin cream since the Cipro  was actually resistant even though he was positive for Pseudomonas on culture. Overall I think that he does seem to be doing better though I am unsure whether or not they have actually been putting the cream on. The patient is not sure that we did talk to the nurse directly and she was going to initiate that treatment. Fortunately there does not appear to be any signs of active infection at this time. No fevers, chills, nausea, vomiting, or diarrhea. 4/28; the area on the right second toe is close to healed. Left medial ankle required debridement 12/07/2020 upon evaluation today patient appears to be doing well with regard to his wounds. In fact the right second toe appears to be completely healed which is great news. Fortunately there does not appear to be any signs of active infection at this time which is also great news. I think we can probably discontinue the gentamicin on top of everything else. 12/14/2020 upon evaluation today patient appears to be doing well with regard to his wound. He is making good progress and overall very pleased with where things stand today. There is no signs of active infection at this  time which is great news. 12/28/2020 upon evaluation today patient appears to be doing well with regard to his wounds. He has been tolerating the dressing changes without complication. Fortunately there is no signs of active infection at this time. No fevers, chills, nausea, vomiting, or diarrhea. 12/28/2020 upon evaluation today patient's wound bed actually showed signs of excellent improvement. He has great epithelization and granulation I do not see any signs of infection overall I am extremely pleased with where things stand at this point. No fevers, chills, nausea, vomiting, or diarrhea. 01/11/2021 upon evaluation today patient appears to be doing well with regard to his wound on his leg. He has been tolerating the dressing changes without complication. Fortunately there does not appear to be any signs of active infection which is great news. No fevers, chills, nausea, vomiting, or diarrhea. 01/25/2021 upon evaluation today patient appears to be doing well with regard to his wound. He has been tolerating the dressing changes without complication. Fortunately the collagen seems to be doing a great job which is excellent news. No fevers, chills, nausea, vomiting, or diarrhea. 02/08/2021 upon evaluation today patient's wound is actually looking a little bit worse especially in the periwound compared to previous. Fortunately there does not appear to be any signs of infection which is great news with that being said he does have some irritation around the periphery of the wound which has me more concerned. He actually had a dressing on that had not been changed in 3 days. He also is supposed to have daily dressing changes. With regard to the dressing applied he had a silver alginate dressing and silver collagen is what is recommended and ordered. He also had no Desitin around the edges of the wound in the periwound region although that is on the order inspect to be done as well. In general I was very  concerned I did contact [facility] actually spoke with Magda Paganini who is the scheduling individual and subsequently she stated that she would pass the information to the D ON apparently the D ON was not available to talk to me when I call today. 02/18/2021 upon evaluation today patient's wound is actually showing signs of improvement. Fortunately there does not appear to be any evidence of infection which is great news overall I am extremely pleased with where things stand  today. No fevers, chills, nausea, vomiting, or diarrhea. 8/3; patient presents for 1 week follow-up. He has no issues or complaints today. He denies signs of infection. 03/11/2021 upon evaluation today patient appears to be doing well with regard to his wound. He does have a little bit of slough noted on the surface of the wound but fortunately there does not appear to be any signs of active infection at this time. No fevers, chills, nausea, vomiting, or diarrhea. 03/18/2021 upon evaluation today patient appears to be doing well with regard to his wound. He has been tolerating the dressing changes without complication. There was a little irritation more proximal to where the wound was that was not noted last week but nonetheless this is very superficial just seems to be more irritation we just need to make sure to put a good amount of the zinc over the area in my opinion. Otherwise he does not seem to be doing significantly worse at all which is great news. 03/25/2021 upon evaluation today patient appears to be doing well with regard to his wound. He is going require some sharp debridement today to clear with some of the necrotic debris. I did perform this today without complication postdebridement wound bed appears to be doing much better this is great news. 04/08/2021 upon evaluation today patient appears to be doing decently well in regard to his wound although the overall measurement is not significantly smaller compared to  previous. It is gone down a little bit but still the facility continues to not really put the appropriate dressings in place in fact he was supposed to have collagen we think he probably had more of an allergy to At this point. Fortunately there does not appear to be any signs of active infection systemically though locally I do not see anything on initial visualization either as far as erythema or warmth. 04/15/2021 upon evaluation today patient appears to be doing well with regard to his wound. He is actually showing signs of improvement. I did place him on antibiotics last week, Cipro. He has been taking that 2 times a day and seems to be tolerating it very well. I do not see any evidence of worsening and in fact the overall appearance of the wound is smaller today which is also great news. 9/26; left medial ankle chronic venous insufficiency wound is improved. Using Hydrofera Blue 10/10; left medial ankle chronic venous insufficiency. Wound has not changed much in appearance completely nonviable surface. Apparently there have been problems getting the right product on the wound at the facility although he came in with [facility] on today 05/14/2021 upon evaluation today patient appears to be doing well with regard to his wound. I think he is making progress here which is good news. Fortunately there does not appear to be any signs of active infection at this time. No fevers, chills, nausea, vomiting, or diarrhea. 05/20/2021 upon evaluation today patient appears to be doing well with regard to his wound. He is showing signs of good improvement which is great news. There does not appear to be any evidence of active infection which is also excellent news. No fevers, chills, nausea, vomiting, or diarrhea. ODA, [place] ([id number]) 05/28/2021 upon evaluation today patient appears to be doing quite well. There does not appear to be any signs of active infection at this time which is great news.  Overall I am extremely pleased with where things stand today. I think he is headed in the right direction. 06/11/2021 upon evaluation  today patient appears to be doing well with regard to his left ankle ulcer and poorly in regard to the toe ulcer on the second toe right foot. This appears to show signs of joint exposure. Apparently this has been present for 1 to 2 months although he kept forgetting to tell me about it. That is unfortunate as right now it definitely appears to be doing significantly worse than what I would like to see. There does not appear to be any signs of active infection systemically though locally I am concerned about the possibility of infection the toe is quite red. Again no one from the facility ever contacted [place] to advise that this was going on in the interim either. Objective Constitutional Well-nourished and well-hydrated in no acute distress. Vitals Time Taken: 3:01 PM, Height: 67 in, Weight: 160 lbs, BMI: 25.1, Temperature: 98.4 F, Pulse: 91 bpm, Respiratory Rate: 18 breaths/min, Blood Pressure: 122/80 mmHg. Respiratory normal breathing without difficulty. Psychiatric this patient is able to make decisions and demonstrates good insight into disease process. Alert and Oriented x 3. pleasant and cooperative. General Notes: Upon inspection patient's wound on the left ankle seems to be doing quite well. On the right second toe this unfortunately is not doing nearly as well and I am much more concerned about what I see here. I did perform debridement and remove some of the what appears to be necrotic bone with a curette this is good to be sent for pathology. Nonetheless I am concerned about the fact that the patient may end up losing this toe I am going to go and place him on doxycycline though I think risk of amputation is quite significant. Integumentary (Hair, Skin) Wound #8 status is Open. Original cause of wound was Gradually Appeared. The date acquired was:  07/12/2019. The wound has been in treatment 43 weeks. The wound is located on the Left,Medial Ankle. The wound measures 1cm length x 0.5cm width x 0.1cm depth; 0.393cm^2 area and 0.039cm^3 volume. There is Fat Layer (Subcutaneous Tissue) exposed. There is no tunneling or undermining noted. There is a medium amount of serosanguineous drainage noted. The wound margin is flat and intact. There is medium (34-66%) red granulation within the wound bed. There is a medium (34-66%) amount of necrotic tissue within the wound bed including Adherent Slough. Wound #9 status is Open. Original cause of wound was Gradually Appeared. The date acquired was: 05/31/2021. The wound is located on the Right Toe Second. The wound measures 0.6cm length x 0.8cm width x 0.2cm depth; 0.377cm^2 area and 0.075cm^3 volume. There is bone, joint, tendon, and Fat Layer (Subcutaneous Tissue) exposed. There is no tunneling or undermining noted. There is a medium amount of serosanguineous drainage noted. There is small (1-33%) pink granulation within the wound bed. There is a large (67-100%) amount of necrotic tissue within the wound bed including Adherent Slough. 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 There was a Excisional Skin/Subcutaneous Tissue/Muscle/Bone Debridement with a total area of 0.48 sq cm performed by Tommie Sams., PA-C. With the following instrument(s): Curette to remove Viable and Non-Viable tissue/material. Material removed includes Bone,Subcutaneous Tissue, Slough, Skin: Dermis, and Skin: Epidermis after achieving pain control using Lidocaine 4% Topical Solution. 1 specimen  was taken by a Tissue Culture and sent to the lab per facility [facility], [name] ([id number]) protocol. A time out was conducted at 15:19, prior to the start of the procedure. A Minimum amount of bleeding was controlled with Pressure. The procedure was tolerated well with a pain level of 0 throughout and a pain level of 0 following the procedure. Post Debridement Measurements: 0.6cm length x 0.8cm width x 0.2cm depth; 0.075cm^3 volume. Post debridement Stage noted as Category/Stage IV. Character of Wound/Ulcer Post Debridement is improved. Post procedure Diagnosis Wound #9: Same as Pre-Procedure Plan Follow-up Appointments: Return Appointment in 1 week. Bathing/ Shower/ Hygiene: May shower; gently cleanse wound with antibacterial soap, rinse and pat dry prior to dressing wounds Edema Control - Lymphedema / Segmental Compressive Device / Other: Elevate legs to the level of the heart and pump ankles as often as possible Elevate leg(s) parallel to the floor when sitting. DO YOUR BEST to sleep in the bed at night. DO NOT sleep in your recliner. Long hours of sitting in a recliner leads to swelling of the legs and/or potential wounds on your backside. Radiology ordered were: X-ray, toes right foot - non healing wound with bone exposed 2nd toe right foot to be performed at [facility] facility The following medication(s) was prescribed: doxycycline hyclate oral 100 mg capsule 1 1 capsule oral take 2 times per day for 30 days starting 06/11/2021 WOUND #8: - Ankle Wound Laterality: Left, Medial Cleanser: Normal Saline 3 x Per Week/30 Days Discharge Instructions: Wash your hands with soap and water. Remove old dressing, discard into plastic bag and place into trash. Cleanse the wound with Normal Saline prior to applying a clean dressing using gauze sponges, not tissues or cotton balls. Do not scrub or use excessive force. Pat dry using gauze sponges, not tissue or cotton balls. Peri-Wound  Care: Desitin Maximum Strength Ointment 4 (oz) 3 x Per Week/30 Days Discharge Instructions: Apply zinc periwound Primary Dressing: Hydrofera Blue Ready Transfer Foam, 2.5x2.5 (in/in) 3 x Per Week/30 Days Discharge Instructions: Apply Hydrofera Blue Ready to wound bed as directed (EXTRA SENT WITH PATIENT, PLEASE USE) Secondary Dressing: Kerlix 4.5 x 4.1 (in/yd) 3 x Per Week/30 Days Discharge Instructions: Apply kerlix over ABD pad Secured With: 77M Medipore H Soft Cloth Surgical Tape, 2x2 (in/yd) 3 x Per Week/30 Days Discharge Instructions: Secure kerlix WOUND #9: - Toe Second Wound Laterality: Right Cleanser: Normal Saline 3 x Per Week/30 Days Discharge Instructions: Wash your hands with soap and water. Remove old dressing, discard into plastic bag and place into trash. Cleanse the wound with Normal Saline prior to applying a clean dressing using gauze sponges, not tissues or cotton balls. Do not scrub or use excessive force. Pat dry using gauze sponges, not tissue or cotton balls. Peri-Wound Care: Desitin Maximum Strength Ointment 4 (oz) 3 x Per Week/30 Days Discharge Instructions: Apply zinc periwound Primary Dressing: Hydrofera Blue Ready Transfer Foam, 2.5x2.5 (in/in) 3 x Per Week/30 Days Discharge Instructions: Apply Hydrofera Blue Ready to wound bed as directed (EXTRA SENT WITH PATIENT, PLEASE USE) Secondary Dressing: Coverlet Latex-Free Fabric Adhesive Dressings 3 x Per Week/30 Days Discharge Instructions: 1.5 x 2 Secured With: 77M Medipore H Soft Cloth Surgical Tape, 2x2 (in/yd) 3 x Per Week/30 Days Discharge Instructions: Secure kerlix 1. I would recommend currently that we go ahead and continue with the wound care measures as before and the patient is in agreement the plan this is including the [facility] we can use at both  locations. 2. I am also can recommend that we have the patient continue with dressing changes 3 times a week at both sites. 3. I am also can recommend an  x-ray of the right foot looking for any signs of osteomyelitis of the toe on the second toe right foot. 4. I am also can a send the pathology and we will see what that shows as well. 5. I am going to go ahead and place the patient with doxycycline based on what I am seeing as well as this is likely an osteomyelitis type situation. We will see patient back for reevaluation in 1 week here in the clinic. If anything worsens or changes patient will contact our office for additional recommendations. Electronic Signature(s) Signed: 06/11/2021 4:13:48 PM By: Worthy Keeler PA-C Previous Signature: 06/11/2021 4:12:37 PM Version By: Worthy Keeler PA-C Entered By: Worthy Keeler on 06/11/2021 16:13:45 Chad [name] ([id number]) -------------------------------------------------------------------------------- SuperBill Details Patient Name: Chad [name] Date of Service: 06/11/2021 Medical Record Number: [id number] Patient Account Number: [id number] Date of Birth/Sex: Dec 09, 1960 (60 y.o. 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

## 2021-06-11 NOTE — Progress Notes (Addendum)
Chad [name] ([id number]) Visit Report for 06/11/2021 Arrival Information Details Patient Name: Chad [name] Date of Service: 06/11/2021 2:45 PM Medical Record Number: [id number] Patient Account Number: [id number] Date of Birth/Sex: 09-Oct-1960 (60 y.o. M) Treating RN: Carlene Coria Primary Care Kassandra Meriweather: SYSTEM, PCP Other Clinician: Referring Tosha Belgarde: Clayborn Bigness Treating Harol Shabazz/Extender: Skipper Cliche in Treatment: 43 Visit Information History Since Last Visit All ordered tests and consults were completed: No Patient Arrived: Wheel Chair Added or deleted any medications: No Arrival Time: 14:56 Any new allergies or adverse reactions: No Accompanied By: self Had a fall or experienced change in No Transfer Assistance: None activities of daily living that may affect Patient Identification Verified: Yes risk of falls: Secondary Verification Process Completed: Yes Signs or symptoms of abuse/neglect since last visito No Patient Requires Transmission-Based No Hospitalized since last visit: No Precautions: Implantable device outside of the clinic excluding No Patient Has Alerts: Yes cellular tissue based products placed in the center Patient Alerts: Patient on Blood since last visit: Thinner Has Dressing in Place as Prescribed: Yes ABI right 1.16 Pain Present Now: No ABI left 1.14 Electronic Signature(s) Signed: 06/14/2021 10:13:49 AM By: Carlene Coria RN Entered By: Carlene Coria on 06/11/2021 15:01:04 Chad [name] ([id number]) -------------------------------------------------------------------------------- Clinic Level of Care Assessment Details Patient Name: Chad [name] Date of Service: 06/11/2021 2:45 PM Medical Record Number: [id number] Patient Account Number: [id number] Date of Birth/Sex: 1960-08-07 (60 y.o. M) Treating RN: Carlene Coria Primary Care Christean Silvestri: SYSTEM, PCP Other Clinician: Referring Glorine Hanratty: Clayborn Bigness Treating Teofila Bowery/Extender: Skipper Cliche in Treatment: 43 Clinic Level of Care Assessment Items TOOL 1 Quantity Score []  - Use when EandM and Procedure is performed on INITIAL visit 0 ASSESSMENTS - Nursing Assessment / Reassessment []  - General Physical Exam (combine w/ comprehensive assessment (listed just below) when performed on new 0 pt. evals) []  - 0 Comprehensive Assessment (HX, ROS, Risk Assessments, Wounds Hx, etc.) ASSESSMENTS - Wound and Skin Assessment / Reassessment []  - Dermatologic / Skin Assessment (not related to wound area) 0 ASSESSMENTS - Ostomy and/or Continence Assessment and Care []  - Incontinence Assessment and Management 0 []  - 0 Ostomy Care Assessment and Management (repouching, etc.) PROCESS - Coordination of Care []  - Simple Patient / Family Education for ongoing care 0 []  - 0 Complex (extensive) Patient / Family Education for ongoing care []  - 0 Staff obtains Programmer, systems, Records, Test Results / Process Orders []  - 0 Staff telephones HHA, Nursing Homes / Clarify orders / etc []  - 0 Routine Transfer to another Facility (non-emergent condition) []  - 0 Routine Hospital Admission (non-emergent condition) []  - 0 New Admissions / Biomedical engineer / Ordering NPWT, Apligraf, etc. []  - 0 Emergency Hospital Admission (emergent condition) PROCESS - Special Needs []  - Pediatric / Minor Patient Management 0 []  - 0 Isolation Patient Management []  - 0 Hearing / Language / Visual special needs []  - 0 Assessment of Community assistance (transportation, D/C planning, etc.) []  - 0 Additional assistance / Altered mentation []  - 0 Support Surface(s) Assessment (bed, cushion, seat, etc.) INTERVENTIONS - Miscellaneous []  - External ear exam 0 []  - 0 Patient Transfer (multiple staff / Civil Service fast streamer / Similar devices) []  - 0 Simple Staple / Suture removal (25 or less) []  - 0 Complex Staple / Suture removal (26 or more) []  - 0 Hypo/Hyperglycemic Management (do not check if billed  separately) []  - 0 Ankle / Brachial Index (ABI) - do not check if billed separately Has the patient been seen at the hospital within  the last three years: Yes Total Score: 0 Level Of Care: ____ Chad [name] ([id number]) Electronic Signature(s) Signed: 06/14/2021 10:13:49 AM By: Carlene Coria RN Entered By: Carlene Coria on 06/11/2021 15:49:51 Chad [name] ([id number]) -------------------------------------------------------------------------------- Encounter Discharge Information Details Patient Name: Chad [name] Date of Service: 06/11/2021 2:45 PM Medical Record Number: [id number] Patient Account Number: [id number] Date of Birth/Sex: [birth year] (60 y.o. M) Treating RN: Carlene Coria Primary Care Olin Gurski: SYSTEM, PCP Other Clinician: Referring Kaegan Hettich: Clayborn Bigness Treating Marine Lezotte/Extender: Skipper Cliche in Treatment: 43 Encounter Discharge Information Items Post Procedure Vitals Discharge Condition: Stable Temperature (F): 98.4 Ambulatory Status: Ambulatory Pulse (bpm): 91 Discharge Destination: Home Respiratory Rate (breaths/min): 18 Transportation: Private Auto Blood Pressure (mmHg): 122/80 Accompanied By: self Schedule Follow-up Appointment: Yes Clinical Summary of Care: Patient Declined Electronic Signature(s) Signed: 06/11/2021 3:56:23 PM By: Carlene Coria RN Entered By: Carlene Coria on 06/11/2021 15:56:23 Chad [name] ([id number]) -------------------------------------------------------------------------------- Lower Extremity Assessment Details Patient Name: Chad [name] Date of Service: 06/11/2021 2:45 PM Medical Record Number: [id number] Patient Account Number: [id number] Date of Birth/Sex: 1960/09/16 (60 y.o. M) Treating RN: Carlene Coria Primary Care Kyston Gonce: SYSTEM, PCP Other Clinician: Referring Nashya Garlington: Clayborn Bigness Treating Pavle Wiler/Extender: Skipper Cliche in Treatment: 43 Vascular Assessment Pulses: Dorsalis Pedis Palpable:  [Right:Yes] Electronic Signature(s) Signed: 06/14/2021 10:13:49 AM By: Carlene Coria RN Entered By: Carlene Coria on 06/11/2021 15:09:00 Chad [name] ([id number]) -------------------------------------------------------------------------------- Multi Wound Chart Details Patient Name: Chad [name] Date of Service: 06/11/2021 2:45 PM Medical Record Number: [id number] Patient Account Number: [id number] Date of Birth/Sex: 07-Sep-1960 (60 y.o. M) Treating RN: Carlene Coria Primary Care Zaiden Ludlum: SYSTEM, PCP Other Clinician: Referring Theodoro Koval: Clayborn Bigness Treating Herby Amick/Extender: Skipper Cliche in Treatment: 43 Vital Signs Height(in): 32 Pulse(bpm): 45 Weight(lbs): 160 Blood Pressure(mmHg): 122/80 Body Mass Index(BMI): 25 Temperature(F): 98.4 Respiratory Rate(breaths/min): 18 Photos: [N/A:N/A] Wound Location: Left, Medial Ankle Right Toe Second N/A Wounding Event: Gradually Appeared Gradually Appeared N/A Primary Etiology: Venous Leg Ulcer Pressure Ulcer N/A Comorbid History: Anemia, Chronic Obstructive Anemia, Chronic Obstructive N/A Pulmonary Disease (COPD), Pulmonary Disease (COPD), Hepatitis C, Received Hepatitis C, Received Chemotherapy Chemotherapy Date Acquired: 07/12/2019 05/31/2021 N/A Weeks of Treatment: 43 0 N/A Wound Status: Open Open N/A Measurements L x W x D (cm) 1x0.5x0.1 0.6x0.8x0.2 N/A Area (cm) : 0.393 0.377 N/A Volume (cm) : 0.039 0.075 N/A % Reduction in Area: 93.30% 0.00% N/A % Reduction in Volume: 93.40% 0.00% N/A Classification: Full Thickness Without Exposed Category/Stage IV N/A Support Structures Exudate Amount: Medium Medium N/A Exudate Type: Serosanguineous Serosanguineous N/A Exudate Color: red, brown red, brown N/A Wound Margin: Flat and Intact N/A N/A Granulation Amount: Medium (34-66%) Small (1-33%) N/A Granulation Quality: Red Pink N/A Necrotic Amount: Medium (34-66%) Large (67-100%) N/A Exposed Structures: Fat Layer (Subcutaneous  Tissue): Fat Layer (Subcutaneous Tissue): N/A Yes Yes Fascia: No Tendon: Yes Tendon: No Joint: Yes Muscle: No Bone: Yes Joint: No Fascia: No Bone: No Muscle: No Epithelialization: None None N/A Treatment Notes Electronic Signature(s) Signed: 06/14/2021 10:13:49 AM By: Carlene Coria RN Entered By: Carlene Coria on 06/11/2021 15:17:40 Chad [name] ([id number]) -------------------------------------------------------------------------------- Multi-Disciplinary Care Plan Details Patient Name: Chad [name] Date of Service: 06/11/2021 2:45 PM Medical Record Number: [id number] Patient Account Number: [id number] Date of Birth/Sex: 07/23/1961 (60 y.o. M) Treating RN: Carlene Coria Primary Care Latara Micheli: SYSTEM, PCP Other Clinician: Referring Rodneshia Greenhouse: Clayborn Bigness Treating Martavis Gurney/Extender: Skipper Cliche in Treatment: 43 Active Inactive Necrotic Tissue Nursing Diagnoses: Impaired tissue integrity related to necrotic/devitalized tissue Knowledge deficit related to management of necrotic/devitalized tissue Goals: Necrotic/devitalized tissue will  be minimized in the wound bed Date Initiated: 05/20/2021 Target Resolution Date: 05/20/2021 Goal Status: Active Patient/caregiver will verbalize understanding of reason and process for debridement of necrotic tissue Date Initiated: 05/20/2021 Target Resolution Date: 05/20/2021 Goal Status: Active Interventions: Assess patient pain level pre-, during and post procedure and prior to discharge Provide education on necrotic tissue and debridement process Treatment Activities: Apply topical anesthetic as ordered : 05/20/2021 Notes: Electronic Signature(s) Signed: 06/14/2021 10:13:49 AM By: Carlene Coria RN Entered By: Carlene Coria on 06/11/2021 15:14:16 Chad [name] ([id number]) -------------------------------------------------------------------------------- Pain Assessment Details Patient Name: Chad [name] Date of Service:  06/11/2021 2:45 PM Medical Record Number: [id number] Patient Account Number: [id number] Date of Birth/Sex: 04-01-1961 (60 y.o. M) Treating RN: Carlene Coria Primary Care Shirell Struthers: SYSTEM, PCP Other Clinician: Referring Lisabeth Mian: Clayborn Bigness Treating Odilia Damico/Extender: Skipper Cliche in Treatment: 43 Active Problems Location of Pain Severity and Description of Pain Patient Has Paino No Site Locations Pain Management and Medication Current Pain Management: Electronic Signature(s) Signed: 06/14/2021 10:13:49 AM By: Carlene Coria RN Entered By: Carlene Coria on 06/11/2021 15:02:07 Chad [name] ([id number]) -------------------------------------------------------------------------------- Patient/Caregiver Education Details Patient Name: Chad [name] Date of Service: 06/11/2021 2:45 PM Medical Record Number: [id number] Patient Account Number: [id number] Date of Birth/Gender: 1960-08-13 (60 y.o. M) Treating RN: Carlene Coria Primary Care Physician: SYSTEM, PCP Other Clinician: Referring Physician: Clayborn Bigness Treating Physician/Extender: Skipper Cliche in Treatment: 53 Education Assessment Education Provided To: Patient Education Topics Provided Wound Debridement: Methods: Explain/Verbal Responses: State content correctly Electronic Signature(s) Signed: 06/14/2021 10:13:49 AM By: Carlene Coria RN Entered By: Carlene Coria on 06/11/2021 15:50:14 Chad [name] ([id number]) -------------------------------------------------------------------------------- Wound Assessment Details Patient Name: Chad [name] Date of Service: 06/11/2021 2:45 PM Medical Record Number: [id number] Patient Account Number: [id number] Date of Birth/Sex: 1960/07/31 (60 y.o. M) Treating RN: Carlene Coria Primary Care Torie Towle: SYSTEM, PCP Other Clinician: Referring Enijah Furr: Clayborn Bigness Treating Jaqualyn Juday/Extender: Skipper Cliche in Treatment: 43 Wound Status Wound Number: 8 Primary Venous Leg  Ulcer Etiology: Wound Location: Left, Medial Ankle Wound Open Wounding Event: Gradually Appeared Status: Date Acquired: 07/12/2019 Comorbid Anemia, Chronic Obstructive Pulmonary Disease (COPD), Weeks Of Treatment: 43 History: Hepatitis C, Received Chemotherapy Clustered Wound: No Photos Wound Measurements Length: (cm) 1 Width: (cm) 0.5 Depth: (cm) 0.1 Area: (cm) 0.393 Volume: (cm) 0.039 % Reduction in Area: 93.3% % Reduction in Volume: 93.4% Epithelialization: None Tunneling: No Undermining: No Wound Description Classification: Full Thickness Without Exposed Support Structures Wound Margin: Flat and Intact Exudate Amount: Medium Exudate Type: Serosanguineous Exudate Color: red, brown Foul Odor After Cleansing: No Slough/Fibrino Yes Wound Bed Granulation Amount: Medium (34-66%) Exposed Structure Granulation Quality: Red Fascia Exposed: No Necrotic Amount: Medium (34-66%) Fat Layer (Subcutaneous Tissue) Exposed: Yes Necrotic Quality: Adherent Slough Tendon Exposed: No Muscle Exposed: No Joint Exposed: No Bone Exposed: No Treatment Notes Wound #8 (Ankle) Wound Laterality: Left, Medial Cleanser Normal Saline Discharge Instruction: Wash your hands with soap and water. Remove old dressing, discard into plastic bag and place into trash. Cleanse the wound with Normal Saline prior to applying a clean dressing using gauze sponges, not tissues or cotton balls. Do not scrub or use excessive force. Pat dry using gauze sponges, not tissue or cotton balls. KAYAN, BLISSETT ([id number]) Peri-Wound Care Desitin Maximum Strength Ointment 4 (oz) Discharge Instruction: Apply zinc periwound Topical Primary Dressing Hydrofera Blue Ready Transfer Foam, 2.5x2.5 (in/in) Discharge Instruction: Apply Hydrofera Blue Ready to wound bed as directed (EXTRA SENT WITH PATIENT, PLEASE USE) Secondary Dressing Kerlix 4.5 x 4.1 (in/yd) Discharge Instruction: Apply  kerlix over ABD pad Secured  With 18M [place] Surgical Tape, 2x2 (in/yd) Discharge Instruction: Secure kerlix Compression Wrap Compression Stockings Add-Ons Electronic Signature(s) Signed: 06/14/2021 10:13:49 AM By: Carlene Coria RN Entered By: Carlene Coria on 06/11/2021 15:08:44 Chad [name] ([id number]) -------------------------------------------------------------------------------- Wound Assessment Details Patient Name: Chad [name] Date of Service: 06/11/2021 2:45 PM Medical Record Number: [id number] Patient Account Number: [id number] Date of Birth/Sex: 1961-03-25 (60 y.o. M) Treating RN: Carlene Coria Primary Care Claudell Wohler: SYSTEM, PCP Other Clinician: Referring Ira Dougher: Clayborn Bigness Treating Muhammad Vacca/Extender: Skipper Cliche in Treatment: 43 Wound Status Wound Number: 9 Primary Pressure Ulcer Etiology: Wound Location: Right Toe Second Wound Open Wounding Event: Gradually Appeared Status: Date Acquired: 05/31/2021 Comorbid Anemia, Chronic Obstructive Pulmonary Disease (COPD), Weeks Of Treatment: 0 History: Hepatitis C, Received Chemotherapy Clustered Wound: No Photos Wound Measurements Length: (cm) 0.6 Width: (cm) 0.8 Depth: (cm) 0.2 Area: (cm) 0.377 Volume: (cm) 0.075 % Reduction in Area: 0% % Reduction in Volume: 0% Epithelialization: None Tunneling: No Undermining: No Wound Description Classification: Category/Stage IV Exudate Amount: Medium Exudate Type: Serosanguineous Exudate Color: red, brown Foul Odor After Cleansing: No Slough/Fibrino Yes Wound Bed Granulation Amount: Small (1-33%) Exposed Structure Granulation Quality: Pink Fascia Exposed: No Necrotic Amount: Large (67-100%) Fat Layer (Subcutaneous Tissue) Exposed: Yes Necrotic Quality: Adherent Slough Tendon Exposed: Yes Muscle Exposed: No Joint Exposed: Yes Bone Exposed: Yes Treatment Notes Wound #9 (Toe Second) Wound Laterality: Right Cleanser Normal Saline Discharge Instruction: Wash your  hands with soap and water. Remove old dressing, discard into plastic bag and place into trash. Cleanse the wound with Normal Saline prior to applying a clean dressing using gauze sponges, not tissues or cotton balls. Do not scrub or use excessive force. Pat dry using gauze sponges, not tissue or cotton balls. JARIAN, LONGORIA ([id number]) Peri-Wound Care Desitin Maximum Strength Ointment 4 (oz) Discharge Instruction: Apply zinc periwound Topical Primary Dressing Hydrofera Blue Ready Transfer Foam, 2.5x2.5 (in/in) Discharge Instruction: Apply Hydrofera Blue Ready to wound bed as directed (EXTRA SENT WITH PATIENT, PLEASE USE) Secondary Dressing Coverlet Latex-Free Fabric Adhesive Dressings Discharge Instruction: 1.5 x 2 Secured With 18M Medipore H Soft Cloth Surgical Tape, 2x2 (in/yd) Discharge Instruction: Secure kerlix Compression Wrap Compression Stockings Add-Ons Electronic Signature(s) Signed: 06/14/2021 10:13:49 AM By: Carlene Coria RN Entered By: Carlene Coria on 06/11/2021 15:16:44 Chad [name] ([id number]) -------------------------------------------------------------------------------- Vitals Details Patient Name: Chad [name] Date of Service: 06/11/2021 2:45 PM Medical Record Number: [id number] Patient Account Number: [id number] Date of Birth/Sex: [birth year] (60 y.o. M) Treating RN: Carlene Coria Primary Care Erving Sassano: SYSTEM, PCP Other Clinician: Referring Abby Stines: Clayborn Bigness Treating Paiden Cavell/Extender: Skipper Cliche in Treatment: 43 Vital Signs Time Taken: 15:01 Temperature (F): 98.4 Height (in): 67 Pulse (bpm): 91 Weight (lbs): 160 Respiratory Rate (breaths/min): 18 Body Mass Index (BMI): 25.1 Blood Pressure (mmHg): 122/80 Reference Range: 80 - 120 mg / dl Electronic Signature(s) Signed: 06/14/2021 10:13:49 AM By: Carlene Coria RN Entered By: Carlene Coria on 06/11/2021 15:01:58

## 2021-06-12 ENCOUNTER — Inpatient Hospital Stay: Payer: Medicaid Other | Attending: Internal Medicine

## 2021-06-12 ENCOUNTER — Inpatient Hospital Stay: Payer: Medicaid Other

## 2021-06-12 ENCOUNTER — Inpatient Hospital Stay (HOSPITAL_BASED_OUTPATIENT_CLINIC_OR_DEPARTMENT_OTHER): Payer: Medicaid Other | Admitting: Internal Medicine

## 2021-06-12 VITALS — BP 105/73 | HR 78 | Temp 97.8°F | Resp 16 | Wt 160.0 lb

## 2021-06-12 DIAGNOSIS — C661 Malignant neoplasm of right ureter: Secondary | ICD-10-CM

## 2021-06-12 DIAGNOSIS — C189 Malignant neoplasm of colon, unspecified: Secondary | ICD-10-CM | POA: Diagnosis not present

## 2021-06-12 DIAGNOSIS — Z515 Encounter for palliative care: Secondary | ICD-10-CM

## 2021-06-12 DIAGNOSIS — Z7189 Other specified counseling: Secondary | ICD-10-CM

## 2021-06-12 DIAGNOSIS — Z5112 Encounter for antineoplastic immunotherapy: Secondary | ICD-10-CM | POA: Insufficient documentation

## 2021-06-12 DIAGNOSIS — C182 Malignant neoplasm of ascending colon: Secondary | ICD-10-CM

## 2021-06-12 DIAGNOSIS — Z1509 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: Secondary | ICD-10-CM

## 2021-06-12 LAB — CBC WITH DIFFERENTIAL/PLATELET
Abs Immature Granulocytes: 0.02 10*3/uL (ref 0.00–0.07)
Basophils Absolute: 0.1 10*3/uL (ref 0.0–0.1)
Basophils Relative: 1 %
Eosinophils Absolute: 0.4 10*3/uL (ref 0.0–0.5)
Eosinophils Relative: 6 %
HCT: 38.9 % — ABNORMAL LOW (ref 39.0–52.0)
Hemoglobin: 12.1 g/dL — ABNORMAL LOW (ref 13.0–17.0)
Immature Granulocytes: 0 %
Lymphocytes Relative: 17 %
Lymphs Abs: 1.2 10*3/uL (ref 0.7–4.0)
MCH: 25 pg — ABNORMAL LOW (ref 26.0–34.0)
MCHC: 31.1 g/dL (ref 30.0–36.0)
MCV: 80.4 fL (ref 80.0–100.0)
Monocytes Absolute: 0.5 10*3/uL (ref 0.1–1.0)
Monocytes Relative: 7 %
Neutro Abs: 4.7 10*3/uL (ref 1.7–7.7)
Neutrophils Relative %: 69 %
Platelets: 195 10*3/uL (ref 150–400)
RBC: 4.84 MIL/uL (ref 4.22–5.81)
RDW: 17.2 % — ABNORMAL HIGH (ref 11.5–15.5)
WBC: 6.9 10*3/uL (ref 4.0–10.5)
nRBC: 0 % (ref 0.0–0.2)

## 2021-06-12 LAB — COMPREHENSIVE METABOLIC PANEL
ALT: 19 U/L (ref 0–44)
AST: 32 U/L (ref 15–41)
Albumin: 3.7 g/dL (ref 3.5–5.0)
Alkaline Phosphatase: 109 U/L (ref 38–126)
Anion gap: 9 (ref 5–15)
BUN: 20 mg/dL (ref 8–23)
CO2: 24 mmol/L (ref 22–32)
Calcium: 8.5 mg/dL — ABNORMAL LOW (ref 8.9–10.3)
Chloride: 101 mmol/L (ref 98–111)
Creatinine, Ser: 0.81 mg/dL (ref 0.61–1.24)
GFR, Estimated: >60 mL/min (ref >60)
Glucose, Bld: 154 mg/dL — ABNORMAL HIGH (ref 70–99)
Potassium: 3.7 mmol/L (ref 3.5–5.1)
Sodium: 134 mmol/L — ABNORMAL LOW (ref 135–145)
Total Bilirubin: 0.2 mg/dL — ABNORMAL LOW (ref 0.3–1.2)
Total Protein: 7.8 g/dL (ref 6.5–8.1)

## 2021-06-12 MED ORDER — HEPARIN SOD (PORK) LOCK FLUSH 100 UNIT/ML IV SOLN
INTRAVENOUS | Status: AC
  Administered 2021-06-12: 500 [IU]
  Filled 2021-06-12: qty 5

## 2021-06-12 MED ORDER — SODIUM CHLORIDE 0.9 % IV SOLN
Freq: Once | INTRAVENOUS | Status: AC
  Filled 2021-06-12: qty 250

## 2021-06-12 MED ORDER — SODIUM CHLORIDE 0.9 % IV SOLN
200.0000 mg | Freq: Once | INTRAVENOUS | Status: AC
  Administered 2021-06-12: 200 mg via INTRAVENOUS
  Filled 2021-06-12: qty 8

## 2021-06-12 MED ORDER — HEPARIN SOD (PORK) LOCK FLUSH 100 UNIT/ML IV SOLN
500.0000 [IU] | Freq: Once | INTRAVENOUS | Status: AC | PRN
  Filled 2021-06-12: qty 5

## 2021-06-12 MED ORDER — SODIUM CHLORIDE 0.9% FLUSH
10.0000 mL | Freq: Once | INTRAVENOUS | Status: AC
  Administered 2021-06-12: 10 mL via INTRAVENOUS
  Filled 2021-06-12: qty 10

## 2021-06-12 NOTE — Patient Instructions (Signed)
[facility] [place] [facility]  Discharge Instructions: Thank you for choosing [facility] to provide your oncology and hematology care.  If you have a lab appointment with the Cancer Center, please go directly to the Cancer Center and check in at the registration area.  Wear comfortable clothing and clothing appropriate for easy access to any Portacath or PICC line.   We strive to give you quality time with your provider. You may need to reschedule your appointment if you arrive late (15 or more minutes).  Arriving late affects you and other patients whose appointments are after yours.  Also, if you miss three or more appointments without notifying the office, you may be dismissed from the clinic at the provider's discretion.      For prescription refill requests, have your pharmacy contact our office and allow 72 hours for refills to be completed.    Today you received the following chemotherapy and/or immunotherapy agents : Keytruda    To help prevent nausea and vomiting after your treatment, we encourage you to take your nausea medication as directed.  BELOW ARE SYMPTOMS THAT SHOULD BE REPORTED IMMEDIATELY: *FEVER GREATER THAN 100.4 F (38 C) OR HIGHER *CHILLS OR SWEATING *NAUSEA AND VOMITING THAT IS NOT CONTROLLED WITH YOUR NAUSEA MEDICATION *UNUSUAL SHORTNESS OF BREATH *UNUSUAL BRUISING OR BLEEDING *URINARY PROBLEMS (pain or burning when urinating, or frequent urination) *BOWEL PROBLEMS (unusual diarrhea, constipation, pain near the anus) TENDERNESS IN MOUTH AND THROAT WITH OR WITHOUT PRESENCE OF ULCERS (sore throat, sores in mouth, or a toothache) UNUSUAL RASH, SWELLING OR PAIN  UNUSUAL VAGINAL DISCHARGE OR ITCHING   Items with * indicate a potential emergency and should be followed up as soon as possible or go to the Emergency Department if any problems should occur.  Please show the CHEMOTHERAPY ALERT CARD or IMMUNOTHERAPY ALERT CARD at check-in  to the Emergency Department and triage nurse.  Should you have questions after your visit or need to cancel or reschedule your appointment, please contact [facility]  [phone] and follow the prompts.  Office hours are 8:00 a.m. to 4:30 p.m. Monday - Friday. Please note that voicemails left after 4:00 p.m. may not be returned until the following business day.  We are closed weekends and major holidays. You have access to a nurse at all times for urgent questions. Please call the main number to the clinic [phone] and follow the prompts.  For any non-urgent questions, you may also contact your provider using MyChart. We now offer e-Visits for anyone 18 and older to request care online for non-urgent symptoms. For details visit [email or web address].   Also download the MyChart app! Go to the app store, search "MyChart", open the app, select , and log in with your MyChart username and password.  Due to Covid, a mask is required upon entering the hospital/clinic. If you do not have a mask, one will be given to you upon arrival. For doctor visits, patients may have 1 support person aged 18 or older with them. For treatment visits, patients cannot have anyone with them due to current Covid guidelines and our immunocompromised population.  

## 2021-06-12 NOTE — Progress Notes (Signed)
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  Is alone/in a wheelchair.  Chad [name] 60 y.o.  male above history of stage IV-ureteral cancer/history of stage III colon cancer right side; recurrent sigmoid colon cancer [un-resected/under surveillance] and multiple other comorbidities currently on Keytruda is here for follow-up.  Patient's colonoscopy was not done as planned earlier in the month because of logistical reasons.  Denies any new symptoms of abdominal pain nausea vomiting.  No blood in urine.  Chronic shortness of breath.  Review of Systems  Constitutional:  Positive for malaise/fatigue. Negative for  chills, diaphoresis, fever and weight loss.  HENT:  Negative for nosebleeds and sore throat.   Eyes:  Negative for double vision.  Respiratory:  Positive for cough and shortness of breath. Negative for hemoptysis and wheezing.   Cardiovascular:  Negative for chest pain, palpitations and orthopnea.  Gastrointestinal:  Positive for constipation. Negative for abdominal pain, blood in stool, diarrhea, heartburn, melena, nausea and vomiting.  Genitourinary:  Negative for dysuria, frequency and urgency.  Musculoskeletal:  Positive for back pain and joint pain.  Skin: Negative.  Negative for itching and rash.  Neurological:  Positive for focal weakness. Negative for dizziness, tingling, weakness and headaches.       Chronic left-sided weakness upper than lower extremity.  Endo/Heme/Allergies:  Does not bruise/bleed easily.  Psychiatric/Behavioral:  Negative for depression. The patient is not nervous/anxious and does not have insomnia.     MEDICAL HISTORY:  Past Medical History:  Diagnosis Date   Anemia    Anxiety    ARF (acute respiratory failure) (HCC)    Bladder cancer (HCC)    COPD (chronic obstructive pulmonary disease) ([place])    Depression    Dysphagia    Family history of colon cancer    Family history of kidney cancer    Family history of leukemia    Family history of prostate cancer    GERD (gastroesophageal reflux disease)    Hepatitis    chronic hep c   Hydronephrosis    Hydronephrosis with ureteral stricture    Hyperlipidemia    Knee pain    Left   Malignant neoplasm of colon ([place])    Nerve pain    Peripheral vascular disease ([place])  Prostate cancer [facility])    Stroke [facility])    Ureteral cancer, right ([place])    Urinary frequency    Venous hypertension of both lower extremities     SURGICAL HISTORY: Past Surgical History:  Procedure Laterality Date   COLON SURGERY     En bloc extended right hemicolectomy 07/2017   COLONOSCOPY WITH PROPOFOL N/A 11/06/2020   Procedure:  COLONOSCOPY WITH PROPOFOL;  Surgeon: Jonathon Bellows, MD;  Location: [facility] ENDOSCOPY;  Service: Gastroenterology;  Laterality: N/A;   CYSTOSCOPY W/ RETROGRADES Right 08/30/2018   Procedure: CYSTOSCOPY WITH RETROGRADE PYELOGRAM;  Surgeon: Hollice Espy, MD;  Location: [facility] ORS;  Service: Urology;  Laterality: Right;   CYSTOSCOPY WITH STENT PLACEMENT Right 04/25/2018   Procedure: CYSTOSCOPY WITH STENT PLACEMENT;  Surgeon: Hollice Espy, MD;  Location: [facility] ORS;  Service: Urology;  Laterality: Right;   CYSTOSCOPY WITH STENT PLACEMENT Right 08/30/2018   Procedure: [place];  Surgeon: Hollice Espy, MD;  Location: [facility] ORS;  Service: Urology;  Laterality: Right;   CYSTOSCOPY WITH STENT PLACEMENT Right 03/07/2019   Procedure: CYSTOSCOPY WITH STENT Exchange;  Surgeon: Hollice Espy, MD;  Location: [facility] ORS;  Service: Urology;  Laterality: Right;   CYSTOSCOPY WITH STENT PLACEMENT Right 11/21/2019   Procedure: CYSTOSCOPY WITH STENT [place];  Surgeon: Hollice Espy, MD;  Location: [facility] ORS;  Service: Urology;  Laterality: Right;   LOWER EXTREMITY ANGIOGRAPHY Left 05/23/2019   Procedure: LOWER EXTREMITY ANGIOGRAPHY;  Surgeon: Algernon Huxley, MD;  Location: [place] CV LAB;  Service: Cardiovascular;  Laterality: Left;   LOWER EXTREMITY ANGIOGRAPHY Right 05/30/2019   Procedure: LOWER EXTREMITY ANGIOGRAPHY;  Surgeon: Algernon Huxley, MD;  Location: [place] CV LAB;  Service: Cardiovascular;  Laterality: Right;   LOWER EXTREMITY ANGIOGRAPHY Right 02/13/2020   Procedure: LOWER EXTREMITY ANGIOGRAPHY;  Surgeon: Algernon Huxley, MD;  Location: [place] CV LAB;  Service: Cardiovascular;  Laterality: Right;   LOWER EXTREMITY ANGIOGRAPHY Left 02/20/2020   Procedure: LOWER EXTREMITY ANGIOGRAPHY;  Surgeon: Algernon Huxley, MD;  Location: [place] CV LAB;  Service: Cardiovascular;  Laterality: Left;   PORTA CATH INSERTION N/A 02/28/2019   Procedure: PORTA CATH INSERTION;  Surgeon: Algernon Huxley,  MD;  Location: [place] CV LAB;  Service: Cardiovascular;  Laterality: N/A;   tumor removed       SOCIAL HISTORY: Social History   Socioeconomic History   Marital status: Single    Spouse name: Not on file   Number of children: Not on file   Years of education: Not on file   Highest education level: Not on file  Occupational History   Not on file  Tobacco Use   Smoking status: Every Day    Packs/day: 1.00    Types: Cigarettes   Smokeless tobacco: Never  Vaping Use   Vaping Use: Never used  Substance and Sexual Activity   Alcohol use: Not Currently   Drug use: Not Currently   Sexual activity: Not Currently  Other Topics Concern   Not on file  Social History Narrative    used to live [place]; moved  To [place]- end of April 2019; in Nursing home; 1pp/day; quit alcohol. 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        Family- dad- prostate ca [at 58y]; brother- 55 died of prostate cancer; brother- 72- no cancers [[place]]; sonGerald Stabs [[place]];[name]-32y prostate ca [facility] [place]]; daughter- 47 [[place]]; another daughter 49 [[place]/addict]. will refer genetics counseling. Given MSI- abnormal; highly suspicious of Lynch  syndrome.  Patient's son [name] aware of high possible lynch syndrome.   Social Determinants of Health   Financial Resource Strain: Not on file  Food Insecurity: Not on file  Transportation Needs: Not on file  Physical Activity: Not on file  Stress: Not on file  Social Connections: Not on file  Intimate Partner Violence: Not on file    FAMILY HISTORY: Family History  Problem Relation Age of Onset   Prostate cancer Father 33   Cancer Brother 60       unsure type   Cancer Paternal Uncle        unsure type   Cancer Maternal Grandmother        unsure type   Cancer Paternal Grandmother        unsure type   Kidney cancer Paternal Grandfather    Cancer Other        unsure types   Leukemia Son    Cancer Son        other cancers, possibly colon    ALLERGIES:  is allergic  to penicillins.  MEDICATIONS:  Current Outpatient Medications  Medication Sig Dispense Refill   acetaminophen (TYLENOL) 325 MG tablet Take 650 mg by mouth 3 (three) times daily.      aspirin EC 81 MG tablet Take 1 tablet (81 mg total) by mouth daily. 150 tablet 2   bisacodyl (DULCOLAX) 5 MG EC tablet Take two tablets morning and two tablets afternoon day prior to Miralax prep.     carboxymethylcellulose 1 % ophthalmic solution Apply 1 drop to eye 2 (two) times daily.     Cholecalciferol (VITAMIN D) 50 MCG (2000 UT) CAPS Take by mouth.     Cholecalciferol 50 MCG (2000 UT) CAPS SMARTSIG:1 Tablet(s) By Mouth Daily     clopidogrel (PLAVIX) 75 MG tablet Take 1 tablet (75 mg total) by mouth daily. 30 tablet 11   cyclobenzaprine (FLEXERIL) 10 MG tablet Take 10 mg by mouth at bedtime.     escitalopram (LEXAPRO) 5 MG tablet Take 5 mg by mouth daily.     gabapentin (NEURONTIN) 100 MG capsule Take 100 mg by mouth 3 (three) times daily.     mirtazapine (REMERON) 7.5 MG tablet Take 7.5 mg by mouth at bedtime.     Oxycodone HCl 10 MG TABS Take 10 mg by mouth every 6 (six) hours as needed for pain.     pantoprazole (PROTONIX) 40 MG tablet Take 40 mg by mouth daily.     polyethylene glycol powder (GLYCOLAX/MIRALAX) 17 GM/SCOOP powder One bottle for colonoscopy prep. Use as directed.     RESTORE CALCIUM ALGINATE EX Apply topically. Note: Dressing located on left ankle. Facility cleaning wound "every day shift and applying wound cleanser and appication of calcium Alginate and cover with dry dressing and wrapped with Kerlix"     senna-docusate (SENOKOT-S) 8.6-50 MG tablet Take 1 tablet by mouth daily.     atorvastatin (LIPITOR) 10 MG tablet Take 1 tablet (10 mg total) by mouth daily. 30 tablet 11   No current facility-administered medications for this visit.   Facility-Administered Medications Ordered in Other Visits  Medication Dose Route Frequency Provider Last Rate Last Admin   heparin lock flush 100  unit/mL  500 Units Intracatheter Once PRN Cammie Sickle, MD       pembrolizumab [facility]) 200 mg in sodium chloride 0.9 % 50 mL chemo infusion  200 mg Intravenous Once Cammie Sickle, MD 116 mL/hr at 06/12/21 1024 200 mg at  06/12/21 1024      .  PHYSICAL EXAMINATION: ECOG PERFORMANCE STATUS: 1 - Symptomatic but completely ambulatory  Vitals:   06/12/21 0911  BP: 107/73  Pulse: 83  Resp: 18  Temp: (!) 97.5 F (36.4 C)   Filed Weights   06/12/21 0911  Weight: 160 lb (72.6 kg)    Physical Exam HENT:     Head: Normocephalic and atraumatic.     Mouth/Throat:     Pharynx: No oropharyngeal exudate.  Eyes:     Pupils: Pupils are equal, round, and reactive to light.  Cardiovascular:     Rate and Rhythm: Normal rate and regular rhythm.  Pulmonary:     Effort: No respiratory distress.     Breath sounds: No wheezing.  Abdominal:     General: Bowel sounds are normal. There is no distension.     Palpations: Abdomen is soft. There is no mass.     Tenderness: There is no abdominal tenderness. There is no guarding or rebound.  Musculoskeletal:        General: No tenderness. Normal range of motion.     Cervical back: Normal range of motion and neck supple.  Skin:    General: Skin is warm.     Comments: Bilateral lower extremity ulcerations noted.  Pulses intact bilaterally.  Cystlike lesion noted right elbow-nontender.  No erythema noted.  Neurological:     Mental Status: He is oriented to person, place, and time.     Comments: Sleepy but easily arousable.  Chronic weakness left upper and lower extremity.  Psychiatric:        Mood and Affect: Affect normal.     LABORATORY DATA:  I have reviewed the data as listed Lab Results  Component Value Date   WBC 6.9 06/12/2021   HGB 12.1 (L) 06/12/2021   HCT 38.9 (L) 06/12/2021   MCV 80.4 06/12/2021   PLT 195 06/12/2021   Recent Labs    05/01/21 0819 05/22/21 0835 06/12/21 0855  NA 137 134* 134*  K 4.1 4.0  3.7  CL 104 105 101  CO2 _0 GLUCOSE 84 82 154*  BUN _1 CREATININE 0.87 0.87 0.81  CALCIUM 8.5* 8.4* 8.5*  GFRNONAA >60 >60 >60  PROT 7.7 7.6 7.8  ALBUMIN 3.7 3.8 3.7  AST 38 32 32  ALT _2 ALKPHOS 125 111 109  BILITOT 0.5 0.2* 0.2*    RADIOGRAPHIC STUDIES: I have personally reviewed the radiological images as listed and agreed with the findings in the report. No results found.  ASSESSMENT & PLAN:   Ureteral cancer, right ([place]) # High-grade urothelial cancer/cytology; likely of the right renal pelvis /upper ureter. On keytruda- OCT 4th, 2022-  The irregular/ill-defined wall thickening seen previously in the right renal pelvis and proximal ureter appears decreased in the interval with less enhancing tissue visible on today's study, but this is a subtle change. Stable 10 mm short axis portal caval lymph node.STABLE.   # Proceed with  [place] today; Labs today reviewed; Labs today reviewed;  acceptable for treatment today.  # Incidental on CT OCT 10th, 2022-Interval development of a 13 mm lobular polypoid lesion right mainstem- mucus/secretion Vs true lesion- will repeat scan in Jan 2023.   #Descending colon mass- PET scan- FEB 2022-uptake in the cecum;  [history of Lynch syndrome]-colonoscopy 25 mm polyp; biopsy mucinous carcinoma- given risk vs benefits -hold surgery for now; re-scheduled for logistical reasons.  # Iron deficiency anemia-hemoglobin  11-12; February 2022 iron studies/ferritin-LOW; hold Venofer with infusions- STABLE.   # Bilateral LE ulcers-s/p wound care evaluation; s/p PTCA with Dr.Dew. bil Arterial Dopp-wnl-STABLE.   #DISPOSITION: #  Chad [name];  # follow up in 3 weeks-;MD labs-cbc/cmp;  Keytruda; ;Dr.B.   All questions were answered. The patient knows to call the clinic with any problems, questions or concerns.    Cammie Sickle, MD 06/12/2021 10:32 AM

## 2021-06-12 NOTE — Assessment & Plan Note (Addendum)
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 On keytruda- OCT 4th, 2022-  The irregular/ill-defined wall thickening seen previously in the right renal pelvis and proximal ureter appears decreased in the interval with less enhancing tissue visible on today's study, but this is a subtle change. Stable 10 mm short axis portal caval lymph node.STABLE.   # Proceed with  [place] today; Labs today reviewed; Labs today reviewed;  acceptable for treatment today.  # Incidental on CT OCT 10th, 2022-Interval development of a 13 mm lobular polypoid lesion right mainstem- mucus/secretion Vs true lesion- will repeat scan in Jan 2023.   #Descending colon mass- PET scan- FEB 2022-uptake in the cecum;  [history of Lynch syndrome]-colonoscopy 25 mm polyp; biopsy mucinous carcinoma- given risk vs benefits -hold surgery for now; re-scheduled for logistical reasons.  # Iron deficiency anemia-hemoglobin 11-12; February 2022 iron studies/ferritin-LOW; hold Venofer with infusions- STABLE.   # Bilateral LE ulcers-s/p wound care evaluation; s/p PTCA with Dr.Dew. bil Arterial Dopp-wnl-STABLE.   #DISPOSITION: #  [name];  # follow up in 3 weeks-;MD labs-cbc/cmp;  Keytruda; ;Dr.B.

## 2021-06-13 LAB — SURGICAL PATHOLOGY

## 2021-06-17 ENCOUNTER — Other Ambulatory Visit: Payer: Self-pay

## 2021-06-17 ENCOUNTER — Encounter: Payer: Medicaid Other | Admitting: Physician Assistant

## 2021-06-17 DIAGNOSIS — I872 Venous insufficiency (chronic) (peripheral): Secondary | ICD-10-CM | POA: Diagnosis not present

## 2021-06-18 ENCOUNTER — Other Ambulatory Visit (HOSPITAL_COMMUNITY): Payer: Self-pay | Admitting: Physician Assistant

## 2021-06-18 ENCOUNTER — Other Ambulatory Visit: Payer: Self-pay | Admitting: Physician Assistant

## 2021-06-18 DIAGNOSIS — L89894 Pressure ulcer of other site, stage 4: Secondary | ICD-10-CM

## 2021-06-21 ENCOUNTER — Ambulatory Visit
Admission: RE | Admit: 2021-06-21 | Discharge: 2021-06-21 | Disposition: A | Discharge status: Home / Self Care | Payer: Medicaid Other | Source: Ambulatory Visit | Attending: Physician Assistant | Admitting: Physician Assistant

## 2021-06-21 ENCOUNTER — Other Ambulatory Visit: Payer: Self-pay

## 2021-06-21 DIAGNOSIS — L89894 Pressure ulcer of other site, stage 4: Secondary | ICD-10-CM | POA: Insufficient documentation

## 2021-06-24 ENCOUNTER — Other Ambulatory Visit: Payer: Self-pay

## 2021-06-24 ENCOUNTER — Encounter: Payer: Medicaid Other | Admitting: Physician Assistant

## 2021-06-24 DIAGNOSIS — I872 Venous insufficiency (chronic) (peripheral): Secondary | ICD-10-CM | POA: Diagnosis not present

## 2021-06-24 NOTE — Progress Notes (Addendum)
MORLEY, GAUMER ([id number]) Visit Report for 06/24/2021 Chief Complaint Document Details Patient Name: Chad [name], Chad [name] Date of Service: 06/24/2021 9:00 AM Medical Record Number: [id number] Patient Account Number: [id number] Date of Birth/Sex: 1961/03/26 (60 y.o. M) Treating RN: Cornell Barman Primary Care Provider: SYSTEM, PCP Other Clinician: Referring Provider: Clayborn Bigness Treating Provider/Extender: Skipper Cliche in Treatment: 44 Information Obtained from: Patient Chief Complaint Left ankle and right foot ulcers Electronic Signature(s) Signed: 06/24/2021 9:08:10 AM By: Worthy Keeler PA-C Entered By: Worthy Keeler on 06/24/2021 09:08:10 Chad [name] ([id number]) -------------------------------------------------------------------------------- Debridement Details Patient Name: Chad [name] Date of Service: 06/24/2021 9:00 AM Medical Record Number: [id number] Patient Account Number: [id number] Date of Birth/Sex: 03/07/1961 (60 y.o. M) Treating RN: Cornell Barman Primary Care Provider: SYSTEM, PCP Other Clinician: Referring Provider: Clayborn Bigness Treating Provider/Extender: Skipper Cliche in Treatment: 44 Debridement Performed for Wound #8 Left,Medial Ankle Assessment: Performed By: Physician Tommie Sams., PA-C Debridement Type: Debridement Severity of Tissue Pre Debridement: Fat layer exposed Level of Consciousness (Pre- Awake and Alert procedure): Pre-procedure Verification/Time Out Yes - 09:24 Taken: Pain Control: Lidocaine Total Area Debrided (L x W): 1 (cm) x 0.6 (cm) = 0.6 (cm) Tissue and other material Slough, Subcutaneous, Biofilm, Slough debrided: Level: Skin/Subcutaneous Tissue Debridement Description: Excisional Instrument: Curette Bleeding: Minimum Hemostasis Achieved: Pressure Response to Treatment: Procedure was tolerated well Level of Consciousness (Post- Awake and Alert procedure): Post Debridement Measurements of Total Wound Length: (cm)  1 Width: (cm) 0.7 Depth: (cm) 0.2 Volume: (cm) 0.11 Character of Wound/Ulcer Post Debridement: Stable Severity of Tissue Post Debridement: Fat layer exposed Post Procedure Diagnosis Same as Pre-procedure Electronic Signature(s) Signed: 06/24/2021 5:14:38 PM By: Worthy Keeler PA-C Signed: 06/25/2021 9:26:19 AM By: Gretta Cool, BSN, RN, CWS, Kim RN, BSN Entered By: Gretta Cool, BSN, RN, CWS, Kim on 06/24/2021 09:24:51 Chad [name] ([id number]) -------------------------------------------------------------------------------- HPI Details Patient Name: Chad [name] Date of Service: 06/24/2021 9:00 AM Medical Record Number: [id number] Patient Account Number: [id number] Date of Birth/Sex: 07-17-1961 (60 y.o. M) Treating RN: Cornell Barman Primary Care Provider: SYSTEM, PCP Other Clinician: Referring Provider: Clayborn Bigness Treating Provider/Extender: Skipper Cliche in Treatment: 44 History of Present Illness HPI Description: 10/08/18 on evaluation today patient actually presents to our office for initial evaluation concerning wounds that he has of the bilateral lower extremities. 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 He also has bilateral venous stasis. He apparently has been homeless currently following discharge from the hospital apparently he has been placed at [facility] which is is a skilled nursing facility locally. Nonetheless fortunately he does not show any signs of infection at this time which is good news. In fact several of the wound actually appears to be showing some signs of improvement already in my pinion. There are a couple areas in the left leg in particular there likely gonna require some sharp debridement to help clear away some necrotic tissue and help with more sufficient healing. No fevers, chills, nausea,  or vomiting noted at this time. 10/15/18 on evaluation today patient actually appears to be doing very well in regard to his bilateral lower extremities. He's been tolerating the dressing changes without complication. Fortunately there does not appear to be any evidence of active infection at this time which is great news. Overall I'm actually very pleased with how this has progressed in just one visits time. Readmission: 08/14/2020  upon evaluation today patient presents for re-evaluation here in our clinic. He is having issues with his left ankle region as well as his right toe and his right heel. He tells me that the toe and heel actually began as a area that was itching that he was scratching and then subsequently opened up into wounds. These may have been abscess areas I presume based on what I am seeing currently. With regard to his left ankle region he tells me this was a similar type occurrence although he does have venous stasis this very well may be more of a venous leg ulcer more than anything. Nonetheless I do believe that the patient would benefit from appropriate and aggressive wound care to try to help get things under better control here. He does have history of a stroke on the left side affecting him to some degree there that he is able to stand although he does have some residual weakness. Otherwise again the patient does have chronic venous insufficiency as previously noted. His arterial studies most recently obtained showed that he had an ABI on the right of 1.16 with a TBI of 0.52 and on the left and ABI of 1.14 with a TBI of 0.81. That was obtained on 06/19/2020. 08/28/2020 upon evaluation today patient appears to be doing decently well in regard to his wounds in general. He has been tolerating the dressing changes without complication. Fortunately there does not appear to be any signs of active infection which is great news. With that being said I think the [facility] is doing  a good job I would recommend that we likely continue with that currently. 09/11/2020 upon evaluation today patient's wounds did not appear to be doing too poorly but again he is not really showing signs of significant improvement with regard to any of the wounds on the right. None of them have Hydrofera Blue on them I am not exactly sure why this is not being followed as the facility did not contact us to let us know of any issues with obtaining dressings or otherwise. With that being said he is supposed to be using Hydrofera Blue on both of the wounds on the right foot as well as the ankle wound on the left side. 09/18/2020 upon evaluation today patient appears to be doing poorly with regard to his wounds. Again right now the left ankle in particular showed signs of extreme maceration. Apparently he was told by someone with staff at [facility] they could not get the [facility]. With that being said this is something that is never been relayed to [place] one way or another. Also the patient subsequently has not supposed to have a border gauze dressing on. He should have an ABD pad and roll gauze to secure as this drains much too much just to have a border gauze dressing to cover. Nonetheless the fact that they are not using the appropriate dressing is directly causing deterioration of the left ankle wound it is significantly worse today compared to what it was previous. I did attempt to call [facility] while the patient was here I called three times and got no one to even pick up the phone. After this I had my for an office coordinator call and she was able to finally get through and leave a message with the D ON as of dictation of this note which is roughly about an hour and a half later I still have not been able to speak with anyone  at the facility. 09/25/2020 upon evaluation today patient actually showing signs of good improvement which is excellent news. He has been tolerating the  dressing changes without complication. Fortunately there is no signs of active infection which is great news. No fevers, chills, nausea, vomiting, or diarrhea. I do feel like the facility has been doing a much better job at taking care of him as far as the dressings are concerned. However the director of nursing never did call me back. 10/09/2020 upon evaluation today patient appears to be doing well with regard to his wound. The toe ulcer did require some debridement but the other 2 areas actually appear to be doing quite well. 10/19/2020 upon evaluation today patient actually appears to be doing very well in regard to his wounds. In fact the heel does appear to be completely healed. The toe is doing better in the medial ankle on the left is also doing better. Overall I think he is headed in the right direction. 10/26/2020 upon evaluation today patient appears to be doing well with regard to his wound. He is showing signs of improvement which is great news and overall I am very pleased with where things stand today. No fevers, chills, nausea, vomiting, or diarrhea. 11/02/2020 upon evaluation today patient appears to be doing well with regard to his wounds. He has been tolerating the dressing changes without complication overall I am extremely pleased with where things stand today. He in regard to the toe is almost completely healed and the medial ankle on the left is doing much better. 11/09/2020 upon evaluation today patient appears to be doing a little poorly in regard to his left medial ankle ulcer. Fortunately there does not appear to be any signs of systemic infection but unfortunately locally he does appear to be infected in fact he has blue-green drainage consistent with Pseudomonas. Chad [name], Chad [name] ([id number]) 11/16/2020 upon evaluation today patient appears to be doing well with regard to his wound. It actually appears to be doing better. I did place him on gentamicin cream since the Cipro was  actually resistant even though he was positive for Pseudomonas on culture. Overall I think that he does seem to be doing better though I am unsure whether or not they have actually been putting the cream on. The patient is not sure that we did talk to the nurse directly and she was going to initiate that treatment. Fortunately there does not appear to be any signs of active infection at this time. No fevers, chills, nausea, vomiting, or diarrhea. 4/28; the area on the right second toe is close to healed. Left medial ankle required debridement 12/07/2020 upon evaluation today patient appears to be doing well with regard to his wounds. In fact the right second toe appears to be completely healed which is great news. Fortunately there does not appear to be any signs of active infection at this time which is also great news. I think we can probably discontinue the gentamicin on top of everything else. 12/14/2020 upon evaluation today patient appears to be doing well with regard to his wound. He is making good progress and overall very pleased with where things stand today. There is no signs of active infection at this time which is great news. 12/28/2020 upon evaluation today patient appears to be doing well with regard to his wounds. He has been tolerating the dressing changes without complication. Fortunately there is no signs of active infection at this time. No fevers, chills, nausea, vomiting, or diarrhea. 12/28/2020  upon evaluation today patient's wound bed actually showed signs of excellent improvement. He has great epithelization and granulation I do not see any signs of infection overall I am extremely pleased with where things stand at this point. No fevers, chills, nausea, vomiting, or diarrhea. 01/11/2021 upon evaluation today patient appears to be doing well with regard to his wound on his leg. He has been tolerating the dressing changes without complication. Fortunately there does not appear to be  any signs of active infection which is great news. No fevers, chills, nausea, vomiting, or diarrhea. 01/25/2021 upon evaluation today patient appears to be doing well with regard to his wound. He has been tolerating the dressing changes without complication. Fortunately the collagen seems to be doing a great job which is excellent news. No fevers, chills, nausea, vomiting, or diarrhea. 02/08/2021 upon evaluation today patient's wound is actually looking a little bit worse especially in the periwound compared to previous. Fortunately there does not appear to be any signs of infection which is great news with that being said he does have some irritation around the periphery of the wound which has me more concerned. He actually had a dressing on that had not been changed in 3 days. He also is supposed to have daily dressing changes. With regard to the dressing applied he had a silver alginate dressing and silver collagen is what is recommended and ordered. He also had no Desitin around the edges of the wound in the periwound region although that is on the order inspect to be done as well. In general I was very concerned I did contact [facility] actually spoke with Magda Paganini who is the scheduling individual and subsequently she stated that she would pass the information to the D ON apparently the D ON was not available to talk to me when I call today. 02/18/2021 upon evaluation today patient's wound is actually showing signs of improvement. Fortunately there does not appear to be any evidence of infection which is great news overall I am extremely pleased with where things stand today. No fevers, chills, nausea, vomiting, or diarrhea. 8/3; patient presents for 1 week follow-up. He has no issues or complaints today. He denies signs of infection. 03/11/2021 upon evaluation today patient appears to be doing well with regard to his wound. He does have a little bit of slough noted on the surface of the wound  but fortunately there does not appear to be any signs of active infection at this time. No fevers, chills, nausea, vomiting, or diarrhea. 03/18/2021 upon evaluation today patient appears to be doing well with regard to his wound. He has been tolerating the dressing changes without complication. There was a little irritation more proximal to where the wound was that was not noted last week but nonetheless this is very superficial just seems to be more irritation we just need to make sure to put a good amount of the zinc over the area in my opinion. Otherwise he does not seem to be doing significantly worse at all which is great news. 03/25/2021 upon evaluation today patient appears to be doing well with regard to his wound. He is going require some sharp debridement today to clear with some of the necrotic debris. I did perform this today without complication postdebridement wound bed appears to be doing much better this is great news. 04/08/2021 upon evaluation today patient appears to be doing decently well in regard to his wound although the overall measurement is not significantly smaller compared to  previous. It is gone down a little bit but still the facility continues to not really put the appropriate dressings in place in fact he was supposed to have collagen we think he probably had more of an allergy to At this point. Fortunately there does not appear to be any signs of active infection systemically though locally I do not see anything on initial visualization either as far as erythema or warmth. 04/15/2021 upon evaluation today patient appears to be doing well with regard to his wound. He is actually showing signs of improvement. I did place him on antibiotics last week, Cipro. He has been taking that 2 times a day and seems to be tolerating it very well. I do not see any evidence of worsening and in fact the overall appearance of the wound is smaller today which is also great news. 9/26; left  medial ankle chronic venous insufficiency wound is improved. Using Hydrofera Blue 10/10; left medial ankle chronic venous insufficiency. Wound has not changed much in appearance completely nonviable surface. Apparently there have been problems getting the right product on the wound at the facility although he came in with [facility] on today 05/14/2021 upon evaluation today patient appears to be doing well with regard to his wound. I think he is making progress here which is good news. Fortunately there does not appear to be any signs of active infection at this time. No fevers, chills, nausea, vomiting, or diarrhea. 05/20/2021 upon evaluation today patient appears to be doing well with regard to his wound. He is showing signs of good improvement which is great news. There does not appear to be any evidence of active infection which is also excellent news. No fevers, chills, nausea, vomiting, or diarrhea. 05/28/2021 upon evaluation today patient appears to be doing quite well. There does not appear to be any signs of active infection at this time which is great news. Overall I am extremely pleased with where things stand today. I think he is headed in the right direction. 06/11/2021 upon evaluation today patient appears to be doing well with regard to his left ankle ulcer and poorly in regard to the toe ulcer on the second toe right foot. This appears to show signs of joint exposure. Apparently this has been present for 1 to 2 months although he kept [facility], Chad [name] ([id number]) forgetting to tell me about it. That is unfortunate as right now it definitely appears to be doing significantly worse than what I would like to see. There does not appear to be any signs of active infection systemically though locally I am concerned about the possibility of infection the toe is quite red. Again no one from the facility ever contacted [place] to advise that this was going on in the interim either. 06/17/2021  upon evaluation today patient presents for follow-up I did review his x-ray which showed a navicular bone fracture I am unsure of the chronicity of this. Subsequently he also had osteomyelitis of the toe which was what I was more concerned about this did not show up on x-ray but did show up on the pathology scrapings. This was listed as acute osteomyelitis. Nonetheless at this point I think that the antibiotic treatment is the best regimen to go with currently. The patient is in agreement with that plan. Nonetheless he has initially 30 days of doxycycline off likely extend that towards the end of the treatment cycle that will be around the middle of December for an additional 2 weeks. That all  depends on how well he continues to heal. Nonetheless based on what I am seeing in the foot I did want a proceed with an MRI as well which I think will be helpful to identify if there is anything else that needs to be addressed from the standpoint of infection. 06/24/2021 upon evaluation today patient appears to be doing pretty well in regards to his wounds. I think both are actually showing signs of improvement which is good I did review his MRI today which did show signs of osteomyelitis of the middle and proximal phalanx on his right foot of the affected toe. With that being said this is actually showing signs of significant improvement today already with the antibiotic therapy I think the redness is also improved. Overall I think that we just need to give this some time with appropriate wound care we will see how things go potentially hyperbarics could be considered. Electronic Signature(s) Signed: 06/24/2021 5:03:10 PM By: Worthy Keeler PA-C Entered By: Worthy Keeler on 06/24/2021 17:03:09 Chad [name] ([id number]) -------------------------------------------------------------------------------- Physical Exam Details Patient Name: Chad [name] Date of Service: 06/24/2021 9:00 AM Medical  Record Number: [id number] Patient Account Number: [id number] Date of Birth/Sex: [birth year] (60 y.o. M) Treating RN: Cornell Barman Primary Care Provider: SYSTEM, PCP Other Clinician: Referring Provider: Clayborn Bigness Treating Provider/Extender: Skipper Cliche in Treatment: 62 Constitutional Well-nourished and well-hydrated in no acute distress. Respiratory normal breathing without difficulty. Psychiatric this patient is able to make decisions and demonstrates good insight into disease process. Alert and Oriented x 3. pleasant and cooperative. Notes Upon inspection patient's wound bed actually showed signs of good granulation and epithelization at this point. Fortunately there does not appear to be any signs of active infection at this time which is good both the toe on the right foot as well as the left ankle seems to be doing quite well. Electronic Signature(s) Signed: 06/24/2021 5:03:29 PM By: Worthy Keeler PA-C Entered By: Worthy Keeler on 06/24/2021 17:03:29 Chad [name] ([id number]) -------------------------------------------------------------------------------- Physician Orders Details Patient Name: Chad [name] Date of Service: 06/24/2021 9:00 AM Medical Record Number: [id number] Patient Account Number: [id number] Date of Birth/Sex: [birth year] (60 y.o. 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 Bathing/ Shower/ Hygiene o May shower; gently  cleanse wound with antibacterial soap, rinse and pat dry prior to dressing wounds Edema Control - Lymphedema / Segmental Compressive Device / Other o Elevate legs to the level of the heart and pump ankles as often as possible o Elevate leg(s) parallel to the floor when sitting. Wound Treatment Wound #8 - Ankle Wound Laterality: Left, Medial Cleanser: Normal Saline 3 x Per Week/30 Days Discharge Instructions: Wash your hands with soap and water. Remove old dressing, discard into plastic bag and place into trash. Cleanse the wound with Normal Saline prior to applying a clean dressing using gauze sponges, not tissues or cotton balls. Do not scrub or use excessive force. Pat dry using gauze sponges, not tissue or cotton balls. Peri-Wound Care: Desitin Maximum Strength Ointment 4 (oz) 3 x Per Week/30 Days Discharge Instructions: Apply zinc periwound Primary  Dressing: Hydrofera Blue Ready Transfer Foam, 2.5x2.5 (in/in) 3 x Per Week/30 Days Discharge Instructions: Apply Hydrofera Blue Ready to wound bed as directed (EXTRA SENT WITH PATIENT, PLEASE USE) Secondary Dressing: Kerlix 4.5 x 4.1 (in/yd) 3 x Per Week/30 Days Discharge Instructions: Apply kerlix over ABD pad Secured With: 52M Medipore H Soft Cloth Surgical Tape, 2x2 (in/yd) 3 x Per Week/30 Days Discharge Instructions: Secure kerlix Wound #9 - Toe Second Wound Laterality: Right Cleanser: Normal Saline 3 x Per Week/30 Days Discharge Instructions: Wash your hands with soap and water. Remove old dressing, discard into plastic bag and place into trash. Cleanse the wound with Normal Saline prior to applying a clean dressing using gauze sponges, not tissues or cotton balls. Do not scrub or use excessive force. Pat dry using gauze sponges, not tissue or cotton balls. Peri-Wound Care: Desitin Maximum Strength Ointment 4 (oz) 3 x Per Week/30 Days Discharge Instructions: Apply zinc periwound Primary Dressing: Hydrofera Blue Ready Transfer Foam,  2.5x2.5 (in/in) 3 x Per Week/30 Days Discharge Instructions: Apply Hydrofera Blue Ready to wound bed as directed (EXTRA SENT WITH PATIENT, PLEASE USE) Secondary Dressing: Coverlet Latex-Free Fabric Adhesive Dressings 3 x Per Week/30 Days Discharge Instructions: 1.5 x 2 Secured With: 52M Medipore H Soft Cloth Surgical Tape, 2x2 (in/yd) 3 x Per Week/30 Days Discharge Instructions: Secure YSMAEL, HIRES ([id number]) Electronic Signature(s) Signed: 06/24/2021 5:14:38 PM By: Worthy Keeler PA-C Signed: 06/25/2021 9:26:19 AM By: Gretta Cool, BSN, RN, CWS, Kim RN, BSN Entered By: Gretta Cool, BSN, RN, CWS, Kim on 06/24/2021 09:25:34 Chad [name] ([id number]) -------------------------------------------------------------------------------- Problem List Details Patient Name: Chad [name] Date of Service: 06/24/2021 9:00 AM Medical Record Number: [id number] Patient Account Number: [id number] Date of Birth/Sex: 05-May-1961 (60 y.o. 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 08/14/2020 No Yes non-dominant side Inactive Problems ICD-10 Code Description Active Date Inactive Date L97.412 Non-pressure chronic ulcer of right heel and midfoot with fat layer exposed 08/14/2020 08/14/2020 L97.512 Non-pressure chronic ulcer of other part of right foot with fat layer exposed 08/14/2020 08/14/2020 Resolved Problems Electronic Signature(s) Signed: 06/24/2021 9:08:01 AM By: Worthy Keeler PA-C Entered By:  Worthy Keeler on 06/24/2021 Chad [name] ([id number]) -------------------------------------------------------------------------------- Progress Note Details Patient Name: Chad [name] Date of Service: 06/24/2021 9:00 AM Medical Record Number: [id number] Patient Account Number: [id number] Date of Birth/Sex: Aug 22, 1960 (60 y.o. M) Treating RN: Cornell Barman Primary Care Provider: SYSTEM, PCP Other Clinician: Referring Provider: Clayborn Bigness Treating Provider/Extender: Skipper Cliche in Treatment: 44 Subjective Chief Complaint Information obtained from Patient Left ankle and right foot ulcers History of Present Illness (HPI) 10/08/18 on evaluation today patient actually presents to our office for initial evaluation concerning wounds that he has of the bilateral lower extremities. 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 He also has bilateral venous stasis. He apparently has been homeless currently following discharge from the hospital apparently he has been placed at [facility] which is is a skilled nursing facility locally.  Nonetheless fortunately he does not show any signs of infection at this time which is good news. In fact several of the wound actually appears to be showing some signs of improvement already in my pinion. There are a couple areas in the left leg in particular there likely gonna require some sharp debridement to help clear away some necrotic tissue and help with more sufficient healing. No fevers, chills, nausea, or vomiting noted at this time. 10/15/18 on evaluation today patient actually appears to be doing very well in regard to his bilateral lower extremities. He's been tolerating the dressing changes without complication. Fortunately there does not appear to be any evidence of active  infection at this time which is great news. Overall I'm actually very pleased with how this has progressed in just one visits time. Readmission: 08/14/2020 upon evaluation today patient presents for re-evaluation here in our clinic. He is having issues with his left ankle region as well as his right toe and his right heel. He tells me that the toe and heel actually began as a area that was itching that he was scratching and then subsequently opened up into wounds. These may have been abscess areas I presume based on what I am seeing currently. With regard to his left ankle region he tells me this was a similar type occurrence although he does have venous stasis this very well may be more of a venous leg ulcer more than anything. Nonetheless I do believe that the patient would benefit from appropriate and aggressive wound care to try to help get things under better control here. He does have history of a stroke on the left side affecting him to some degree there that he is able to stand although he does have some residual weakness. Otherwise again the patient does have chronic venous insufficiency as previously noted. His arterial studies most recently obtained showed that he had an ABI on the right of 1.16 with a TBI of 0.52 and on the left and ABI of 1.14 with a TBI of 0.81. That was obtained on 06/19/2020. 08/28/2020 upon evaluation today patient appears to be doing decently well in regard to his wounds in general. He has been tolerating the dressing changes without complication. Fortunately there does not appear to be any signs of active infection which is great news. With that being said I think the [facility] is doing a good job I would recommend that we likely continue with that currently. 09/11/2020 upon evaluation today patient's wounds did not appear to be doing too poorly but again he is not really showing signs of significant improvement with regard to any of the wounds on the right. None  of them have Hydrofera Blue on them I am not exactly sure why this is not being followed as the facility did not contact us to let us know of any issues with obtaining dressings or otherwise. With that being said he is supposed to be using Hydrofera Blue on both of the wounds on the right foot as well as the ankle wound on the left side. 09/18/2020 upon evaluation today patient appears to be doing poorly with regard to his wounds. Again right now the left ankle in particular showed signs of extreme maceration. Apparently he was told by someone with staff at [facility] they could not get the [facility]. With that being said this is something that is never been relayed to [place] one way or another. Also the patient subsequently has not supposed to  have a border gauze dressing on. He should have an ABD pad and roll gauze to secure as this drains much too much just to have a border gauze dressing to cover. Nonetheless the fact that they are not using the appropriate dressing is directly causing deterioration of the left ankle wound it is significantly worse today compared to what it was previous. I did attempt to call [facility] while the patient was here I called three times and got no one to even pick up the phone. After this I had my for an office coordinator call and she was able to finally get through and leave a message with the D ON as of dictation of this note which is roughly about an hour and a half later I still have not been able to speak with anyone at the facility. 09/25/2020 upon evaluation today patient actually showing signs of good improvement which is excellent news. He has been tolerating the dressing changes without complication. Fortunately there is no signs of active infection which is great news. No fevers, chills, nausea, vomiting, or diarrhea. I do feel like the facility has been doing a much better job at taking care of him as far as the dressings are concerned.  However the director of nursing never did call me back. 10/09/2020 upon evaluation today patient appears to be doing well with regard to his wound. The toe ulcer did require some debridement but the other 2 areas actually appear to be doing quite well. 10/19/2020 upon evaluation today patient actually appears to be doing very well in regard to his wounds. In fact the heel does appear to be completely healed. The toe is doing better in the medial ankle on the left is also doing better. Overall I think he is headed in the right direction. 10/26/2020 upon evaluation today patient appears to be doing well with regard to his wound. He is showing signs of improvement which is great news and overall I am very pleased with where things stand today. No fevers, chills, nausea, vomiting, or diarrhea. 11/02/2020 upon evaluation today patient appears to be doing well with regard to his wounds. He has been tolerating the dressing changes without complication overall I am extremely pleased with where things stand today. He in regard to the toe is almost completely healed and the medial [facility], Chad [name] ([id number]) ankle on the left is doing much better. 11/09/2020 upon evaluation today patient appears to be doing a little poorly in regard to his left medial ankle ulcer. Fortunately there does not appear to be any signs of systemic infection but unfortunately locally he does appear to be infected in fact he has blue-green drainage consistent with Pseudomonas. 11/16/2020 upon evaluation today patient appears to be doing well with regard to his wound. It actually appears to be doing better. I did place him on gentamicin cream since the Cipro was actually resistant even though he was positive for Pseudomonas on culture. Overall I think that he does seem to be doing better though I am unsure whether or not they have actually been putting the cream on. The patient is not sure that we did talk to the nurse directly and she was  going to initiate that treatment. Fortunately there does not appear to be any signs of active infection at this time. No fevers, chills, nausea, vomiting, or diarrhea. 4/28; the area on the right second toe is close to healed. Left medial ankle required debridement 12/07/2020 upon evaluation today patient appears to be  doing well with regard to his wounds. In fact the right second toe appears to be completely healed which is great news. Fortunately there does not appear to be any signs of active infection at this time which is also great news. I think we can probably discontinue the gentamicin on top of everything else. 12/14/2020 upon evaluation today patient appears to be doing well with regard to his wound. He is making good progress and overall very pleased with where things stand today. There is no signs of active infection at this time which is great news. 12/28/2020 upon evaluation today patient appears to be doing well with regard to his wounds. He has been tolerating the dressing changes without complication. Fortunately there is no signs of active infection at this time. No fevers, chills, nausea, vomiting, or diarrhea. 12/28/2020 upon evaluation today patient's wound bed actually showed signs of excellent improvement. He has great epithelization and granulation I do not see any signs of infection overall I am extremely pleased with where things stand at this point. No fevers, chills, nausea, vomiting, or diarrhea. 01/11/2021 upon evaluation today patient appears to be doing well with regard to his wound on his leg. He has been tolerating the dressing changes without complication. Fortunately there does not appear to be any signs of active infection which is great news. No fevers, chills, nausea, vomiting, or diarrhea. 01/25/2021 upon evaluation today patient appears to be doing well with regard to his wound. He has been tolerating the dressing changes without complication. Fortunately the collagen  seems to be doing a great job which is excellent news. No fevers, chills, nausea, vomiting, or diarrhea. 02/08/2021 upon evaluation today patient's wound is actually looking a little bit worse especially in the periwound compared to previous. Fortunately there does not appear to be any signs of infection which is great news with that being said he does have some irritation around the periphery of the wound which has me more concerned. He actually had a dressing on that had not been changed in 3 days. He also is supposed to have daily dressing changes. With regard to the dressing applied he had a silver alginate dressing and silver collagen is what is recommended and ordered. He also had no Desitin around the edges of the wound in the periwound region although that is on the order inspect to be done as well. In general I was very concerned I did contact [facility] actually spoke with Magda Paganini who is the scheduling individual and subsequently she stated that she would pass the information to the D ON apparently the D ON was not available to talk to me when I call today. 02/18/2021 upon evaluation today patient's wound is actually showing signs of improvement. Fortunately there does not appear to be any evidence of infection which is great news overall I am extremely pleased with where things stand today. No fevers, chills, nausea, vomiting, or diarrhea. 8/3; patient presents for 1 week follow-up. He has no issues or complaints today. He denies signs of infection. 03/11/2021 upon evaluation today patient appears to be doing well with regard to his wound. He does have a little bit of slough noted on the surface of the wound but fortunately there does not appear to be any signs of active infection at this time. No fevers, chills, nausea, vomiting, or diarrhea. 03/18/2021 upon evaluation today patient appears to be doing well with regard to his wound. He has been tolerating the dressing changes  without complication. There was  a little irritation more proximal to where the wound was that was not noted last week but nonetheless this is very superficial just seems to be more irritation we just need to make sure to put a good amount of the zinc over the area in my opinion. Otherwise he does not seem to be doing significantly worse at all which is great news. 03/25/2021 upon evaluation today patient appears to be doing well with regard to his wound. He is going require some sharp debridement today to clear with some of the necrotic debris. I did perform this today without complication postdebridement wound bed appears to be doing much better this is great news. 04/08/2021 upon evaluation today patient appears to be doing decently well in regard to his wound although the overall measurement is not significantly smaller compared to previous. It is gone down a little bit but still the facility continues to not really put the appropriate dressings in place in fact he was supposed to have collagen we think he probably had more of an allergy to At this point. Fortunately there does not appear to be any signs of active infection systemically though locally I do not see anything on initial visualization either as far as erythema or warmth. 04/15/2021 upon evaluation today patient appears to be doing well with regard to his wound. He is actually showing signs of improvement. I did place him on antibiotics last week, Cipro. He has been taking that 2 times a day and seems to be tolerating it very well. I do not see any evidence of worsening and in fact the overall appearance of the wound is smaller today which is also great news. 9/26; left medial ankle chronic venous insufficiency wound is improved. Using Hydrofera Blue 10/10; left medial ankle chronic venous insufficiency. Wound has not changed much in appearance completely nonviable surface. Apparently there have been problems getting the right product on  the wound at the facility although he came in with [facility] on today 05/14/2021 upon evaluation today patient appears to be doing well with regard to his wound. I think he is making progress here which is good news. Fortunately there does not appear to be any signs of active infection at this time. No fevers, chills, nausea, vomiting, or diarrhea. 05/20/2021 upon evaluation today patient appears to be doing well with regard to his wound. He is showing signs of good improvement which is great news. There does not appear to be any evidence of active infection which is also excellent news. No fevers, chills, nausea, vomiting, or diarrhea. Chad [name], Chad [name] ([id number]) 05/28/2021 upon evaluation today patient appears to be doing quite well. There does not appear to be any signs of active infection at this time which is great news. Overall I am extremely pleased with where things stand today. I think he is headed in the right direction. 06/11/2021 upon evaluation today patient appears to be doing well with regard to his left ankle ulcer and poorly in regard to the toe ulcer on the second toe right foot. This appears to show signs of joint exposure. Apparently this has been present for 1 to 2 months although he kept forgetting to tell me about it. That is unfortunate as right now it definitely appears to be doing significantly worse than what I would like to see. There does not appear to be any signs of active infection systemically though locally I am concerned about the possibility of infection the toe is quite red. Again no one from  the facility ever contacted [place] to advise that this was going on in the interim either. 06/17/2021 upon evaluation today patient presents for follow-up I did review his x-ray which showed a navicular bone fracture I am unsure of the chronicity of this. Subsequently he also had osteomyelitis of the toe which was what I was more concerned about this did not show up on  x-ray but did show up on the pathology scrapings. This was listed as acute osteomyelitis. Nonetheless at this point I think that the antibiotic treatment is the best regimen to go with currently. The patient is in agreement with that plan. Nonetheless he has initially 30 days of doxycycline off likely extend that towards the end of the treatment cycle that will be around the middle of December for an additional 2 weeks. That all depends on how well he continues to heal. Nonetheless based on what I am seeing in the foot I did want a proceed with an MRI as well which I think will be helpful to identify if there is anything else that needs to be addressed from the standpoint of infection. 06/24/2021 upon evaluation today patient appears to be doing pretty well in regards to his wounds. I think both are actually showing signs of improvement which is good I did review his MRI today which did show signs of osteomyelitis of the middle and proximal phalanx on his right foot of the affected toe. With that being said this is actually showing signs of significant improvement today already with the antibiotic therapy I think the redness is also improved. Overall I think that we just need to give this some time with appropriate wound care we will see how things go potentially hyperbarics could be considered. Objective Constitutional Well-nourished and well-hydrated in no acute distress. Vitals Time Taken: 9:05 AM, Height: 67 in, Weight: 160 lbs, BMI: 25.1, Temperature: 98.3 F, Pulse: 84 bpm, Respiratory Rate: 16 breaths/min, Blood Pressure: 117/75 mmHg. Respiratory normal breathing without difficulty. Psychiatric this patient is able to make decisions and demonstrates good insight into disease process. Alert and Oriented x 3. pleasant and cooperative. General Notes: Upon inspection patient's wound bed actually showed signs of good granulation and epithelization at this point. Fortunately there does not  appear to be any signs of active infection at this time which is good both the toe on the right foot as well as the left ankle seems to be doing quite well. Integumentary (Hair, Skin) Wound #8 status is Open. Original cause of wound was Gradually Appeared. The date acquired was: 07/12/2019. The wound has been in treatment 44 weeks. The wound is located on the Left,Medial Ankle. The wound measures 1cm length x 0.6cm width x 0.1cm depth; 0.471cm^2 area and 0.047cm^3 volume. There is Fat Layer (Subcutaneous Tissue) exposed. There is a medium amount of serosanguineous drainage noted. The wound margin is flat and intact. There is large (67-100%) red granulation within the wound bed. There is a small (1-33%) amount of necrotic tissue within the wound bed including Adherent Slough. Wound #9 status is Open. Original cause of wound was Gradually Appeared. The date acquired was: 05/31/2021. The wound has been in treatment 1 weeks. The wound is located on the Right Toe Second. The wound measures 0.4cm length x 0.7cm width x 0.2cm depth; 0.22cm^2 area and 0.044cm^3 volume. There is bone, joint, tendon, and Fat Layer (Subcutaneous Tissue) exposed. There is a medium amount of serosanguineous drainage noted. There is small (1-33%) pink granulation within the wound bed. There  is a large (67-100%) amount of necrotic tissue within the wound bed including Adherent Slough. 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 There was a Excisional Skin/Subcutaneous Tissue Debridement with a total area of  0.6 sq cm performed by Tommie Sams., PA-C. With the following instrument(s): Curette Material removed includes Subcutaneous Tissue, Slough, and Biofilm after achieving pain control using Lidocaine. No specimens were taken. A time out was conducted at 09:24, prior to the start of the procedure. A Minimum amount of bleeding was controlled with Pressure. The procedure was tolerated well. Post Debridement Measurements: 1cm length x 0.7cm width x 0.2cm depth; 0.11cm^3 volume. Character of Wound/Ulcer Post Debridement is stable. Severity of Tissue Post Debridement is: Fat layer exposed. Post procedure Diagnosis Wound #8: Same as Pre-Procedure Plan Follow-up Appointments: Return Appointment in 1 week. Bathing/ Shower/ Hygiene: May shower; gently cleanse wound with antibacterial soap, rinse and pat dry prior to dressing wounds Edema Control - Lymphedema / Segmental Compressive Device / Other: Elevate legs to the level of the heart and pump ankles as often as possible Elevate leg(s) parallel to the floor when sitting. WOUND #8: - Ankle Wound Laterality: Left, Medial Cleanser: Normal Saline 3 x Per Week/30 Days Discharge Instructions: Wash your hands with soap and water. Remove old dressing, discard into plastic bag and place into trash. Cleanse the wound with Normal Saline prior to applying a clean dressing using gauze sponges, not tissues or cotton balls. Do not scrub or use excessive force. Pat dry using gauze sponges, not tissue or cotton balls. Peri-Wound Care: Desitin Maximum Strength Ointment 4 (oz) 3 x Per Week/30 Days Discharge Instructions: Apply zinc periwound Primary Dressing: Hydrofera Blue Ready Transfer Foam, 2.5x2.5 (in/in) 3 x Per Week/30 Days Discharge Instructions: Apply Hydrofera Blue Ready to wound bed as directed (EXTRA SENT WITH PATIENT, PLEASE USE) Secondary Dressing: Kerlix 4.5 x 4.1 (in/yd) 3 x Per Week/30 Days Discharge Instructions: Apply kerlix over ABD pad Secured  With: 58M Medipore H Soft Cloth Surgical Tape, 2x2 (in/yd) 3 x Per Week/30 Days Discharge Instructions: Secure kerlix WOUND #9: - Toe Second Wound Laterality: Right Cleanser: Normal Saline 3 x Per Week/30 Days Discharge Instructions: Wash your hands with soap and water. Remove old dressing, discard into plastic bag and place into trash. Cleanse the wound with Normal Saline prior to applying a clean dressing using gauze sponges, not tissues or cotton balls. Do not scrub or use excessive force. Pat dry using gauze sponges, not tissue or cotton balls. Peri-Wound Care: Desitin Maximum Strength Ointment 4 (oz) 3 x Per Week/30 Days Discharge Instructions: Apply zinc periwound Primary Dressing: Hydrofera Blue Ready Transfer Foam, 2.5x2.5 (in/in) 3 x Per Week/30 Days Discharge Instructions: Apply Hydrofera Blue Ready to wound bed as directed (EXTRA SENT WITH PATIENT, PLEASE USE) Secondary Dressing: Coverlet Latex-Free Fabric Adhesive Dressings 3 x Per Week/30 Days Discharge Instructions: 1.5 x 2 Secured With: 58M Medipore H Soft Cloth Surgical Tape, 2x2 (in/yd) 3 x Per Week/30 Days Discharge Instructions: Secure kerlix 1. Would recommend that we continue with the Hydrofera Blue to both locations and [employer] this is doing decently well. 2. I am also can recommend that we have  the patient continue with the coverlet for the toe to secure in place and subsequently were still doing the roll gauze to secure in place on the left ankle with an ABD pad. We will see patient back for reevaluation in 1 week here in the clinic. If anything worsens or changes patient will contact our office for additional recommendations. Electronic Signature(s) Signed: 06/24/2021 5:03:56 PM By: Worthy Keeler PA-C Entered By: Worthy Keeler on 06/24/2021 17:03:56 Chad [name] ([id number]) -------------------------------------------------------------------------------- SuperBill Details Patient Name: Chad [name] Date of Service: 06/24/2021 Medical Record Number: [id number] Patient Account Number: [id number] Date of Birth/Sex: 12/04/1960 (60 y.o. 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 Facility Procedures CPT4 Code: 30940768 Description: 08811 - DEB SUBQ TISSUE 20 SQ CM/< Modifier: Quantity: 1 CPT4 Code: Description: ICD-10 Diagnosis Description S31.594 Non-pressure chronic ulcer of left ankle with fat layer exposed Modifier: Quantity: Physician Procedures CPT4 Code: 5859292 Description: 44628 - WC PHYS LEVEL 4 - EST PT Modifier: 25 Quantity: 1 CPT4 Code: Description: ICD-10 Diagnosis Description I87.2 Venous insufficiency (chronic) (peripheral) 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

## 2021-06-25 NOTE — Progress Notes (Signed)
Chad [name], Chad [name] ([id number]) Visit Report for 06/24/2021 Arrival Information Details Patient Name: Chad [name], Chad [name] Date of Service: 06/24/2021 9:00 AM Medical Record Number: [id number] Patient Account Number: [id number] Date of Birth/Sex: 1960/12/09 (60 y.o. M) Treating RN: Cornell Barman Primary Care : SYSTEM, PCP Other Clinician: Referring : Clayborn Bigness Treating /Extender: Skipper Cliche in Treatment: 44 Visit Information History Since Last Visit Has Dressing in Place as Prescribed: Yes Patient Arrived: Wheel Chair Pain Present Now: Yes Arrival Time: 08:57 Accompanied By: self Transfer Assistance: None Patient Identification Verified: Yes Secondary Verification Process Completed: Yes Patient Requires Transmission-Based No Precautions: Patient Has Alerts: Yes Patient Alerts: Patient on Blood Thinner ABI right 1.16 ABI left 1.14 Electronic Signature(s) Signed: 06/25/2021 9:26:19 AM By: Gretta Cool, BSN, RN, CWS, Kim RN, BSN Entered By: Gretta Cool, BSN, RN, CWS, Kim on 06/24/2021 09:04:53 Chad [name] ([id number]) -------------------------------------------------------------------------------- Encounter Discharge Information Details Patient Name: Chad [name] Date of Service: 06/24/2021 9:00 AM Medical Record Number: [id number] Patient Account Number: [id number] Date of Birth/Sex: Sep 21, 1960 (60 y.o. M) Treating RN: Cornell Barman Primary Care : SYSTEM, PCP Other Clinician: Referring : Clayborn Bigness Treating /Extender: Skipper Cliche in Treatment: 32 Encounter Discharge Information Items Post Procedure Vitals Discharge Condition: Stable Temperature (F): 98.7 Ambulatory Status: Wheelchair Pulse (bpm): 78 Discharge Destination: [place] Respiratory Rate (breaths/min): 16 Telephoned: No Blood Pressure (mmHg): 147/88 Orders Sent: Yes Transportation: Private Auto Accompanied By: self Schedule Follow-up Appointment:  Yes Clinical Summary of Care: Electronic Signature(s) Signed: 06/25/2021 9:26:19 AM By: Gretta Cool, BSN, RN, CWS, Kim RN, BSN Entered By: Gretta Cool, BSN, RN, CWS, Kim on 06/24/2021 09:43:14 Chad [name] ([id number]) -------------------------------------------------------------------------------- Lower Extremity Assessment Details Patient Name: Chad [name] Date of Service: 06/24/2021 9:00 AM Medical Record Number: [id number] Patient Account Number: [id number] Date of Birth/Sex: 1961/04/05 (60 y.o. M) Treating RN: Cornell Barman Primary Care : SYSTEM, PCP Other Clinician: Referring : Clayborn Bigness Treating /Extender: Skipper Cliche in Treatment: 44 Edema Assessment Assessed: [Left: No] [Right: No] [Left: Edema] [Right: :] Calf Left: Right: Point of Measurement: 30 cm From Medial Instep 32 cm 31.5 cm Ankle Left: Right: Point of Measurement: 9 cm From Medial Instep 24.5 cm 24 cm Vascular Assessment Pulses: Dorsalis Pedis Palpable: [Left:Yes] [Right:Yes] Electronic Signature(s) Signed: 06/25/2021 9:26:19 AM By: Gretta Cool, BSN, RN, CWS, Kim RN, BSN Entered By: Gretta Cool, BSN, RN, CWS, Kim on 06/24/2021 09:15:13 Chad [name] ([id number]) -------------------------------------------------------------------------------- Multi Wound Chart Details Patient Name: Chad [name] Date of Service: 06/24/2021 9:00 AM Medical Record Number: [id number] Patient Account Number: [id number] Date of Birth/Sex: [birth year] (60 y.o. M) Treating RN: Cornell Barman Primary Care : SYSTEM, PCP Other Clinician: Referring : Clayborn Bigness Treating /Extender: Skipper Cliche in Treatment: 44 Vital Signs Height(in): 19 Pulse(bpm): 62 Weight(lbs): 160 Blood Pressure(mmHg): 117/75 Body Mass Index(BMI): 25 Temperature(F): 98.3 Respiratory Rate(breaths/min): 16 Photos: [N/A:N/A] Wound Location: Left, Medial Ankle Right Toe Second N/A Wounding Event: Gradually Appeared  Gradually Appeared N/A Primary Etiology: Venous Leg Ulcer Pressure Ulcer N/A Comorbid History: Anemia, Chronic Obstructive Anemia, Chronic Obstructive N/A Pulmonary Disease (COPD), Pulmonary Disease (COPD), Hepatitis C, Received Hepatitis C, Received Chemotherapy Chemotherapy Date Acquired: 07/12/2019 05/31/2021 N/A Weeks of Treatment: 44 1 N/A Wound Status: Open Open N/A Measurements L x W x D (cm) 1x0.6x0.1 0.4x0.7x0.2 N/A Area (cm) : 0.471 0.22 N/A Volume (cm) : 0.047 0.044 N/A % Reduction in Area: 92.00% 41.60% N/A % Reduction in Volume: 92.00% 41.30% N/A Classification: Full Thickness Without Exposed Category/Stage IV N/A Support Structures Exudate Amount: Medium Medium N/A Exudate Type: Serosanguineous Serosanguineous  N/A Exudate Color: red, brown red, brown N/A Wound Margin: Flat and Intact N/A N/A Granulation Amount: Large (67-100%) Small (1-33%) N/A Granulation Quality: Red Pink N/A Necrotic Amount: Small (1-33%) Large (67-100%) N/A Exposed Structures: Fat Layer (Subcutaneous Tissue): Fat Layer (Subcutaneous Tissue): N/A Yes Yes Fascia: No Tendon: Yes Tendon: No Joint: Yes Muscle: No Bone: Yes Joint: No Fascia: No Bone: No Muscle: No Epithelialization: Medium (34-66%) None N/A Treatment Notes Electronic Signature(s) Signed: 06/25/2021 9:26:19 AM By: Gretta Cool, BSN, RN, CWS, Kim RN, BSN Entered By: Gretta Cool, BSN, RN, CWS, Kim on 06/24/2021 09:23:50 Chad [name] ([id number]) -------------------------------------------------------------------------------- Multi-Disciplinary Care Plan Details Patient Name: Chad [name] Date of Service: 06/24/2021 9:00 AM Medical Record Number: [id number] Patient Account Number: [id number] Date of Birth/Sex: 19-May-1961 (60 y.o. M) Treating RN: Cornell Barman Primary Care Ileen Kahre: SYSTEM, PCP Other Clinician: Referring Ivanka Kirshner: Clayborn Bigness Treating Perseus Westall/Extender: Skipper Cliche in Treatment: 44 Active Inactive Necrotic  Tissue Nursing Diagnoses: Impaired tissue integrity related to necrotic/devitalized tissue Knowledge deficit related to management of necrotic/devitalized tissue Goals: Necrotic/devitalized tissue will be minimized in the wound bed Date Initiated: 05/20/2021 Target Resolution Date: 05/20/2021 Goal Status: Active Patient/caregiver will verbalize understanding of reason and process for debridement of necrotic tissue Date Initiated: 05/20/2021 Date Inactivated: 06/17/2021 Target Resolution Date: 05/20/2021 Goal Status: Met Interventions: Assess patient pain level pre-, during and post procedure and prior to discharge Provide education on necrotic tissue and debridement process Treatment Activities: Apply topical anesthetic as ordered : 05/20/2021 Notes: Osteomyelitis Nursing Diagnoses: Infection: osteomyelitis Knowledge deficit related to disease process and management Goals: Diagnostic evaluation for osteomyelitis completed as ordered Date Initiated: 06/24/2021 Target Resolution Date: 06/21/2021 Goal Status: Active Patient/caregiver will verbalize understanding of disease process and disease management Date Initiated: 06/24/2021 Target Resolution Date: 06/24/2021 Goal Status: Active Patient's osteomyelitis will resolve Date Initiated: 06/24/2021 Target Resolution Date: 07/22/2021 Goal Status: Active Signs and symptoms for osteomyelitis will be recognized and promptly addressed Date Initiated: 06/24/2021 Target Resolution Date: 06/24/2021 Goal Status: Active Interventions: Assess for signs and symptoms of osteomyelitis resolution every visit Provide education on osteomyelitis Treatment Activities: MRI : 06/21/2021 Systemic antibiotics : 06/24/2021 Notes: YANCEY, PEDLEY ([id number]) Electronic Signature(s) Signed: 06/25/2021 9:26:19 AM By: Gretta Cool, BSN, RN, CWS, Kim RN, BSN Entered By: Gretta Cool, BSN, RN, CWS, Kim on 06/24/2021 20:10:07 Chad [name]  ([id number]) -------------------------------------------------------------------------------- Pain Assessment Details Patient Name: Chad [name] Date of Service: 06/24/2021 9:00 AM Medical Record Number: [id number] Patient Account Number: [id number] Date of Birth/Sex: [birth year] (60 y.o. M) Treating RN: Cornell Barman Primary Care [place]: SYSTEM, PCP Other Clinician: Referring Malori Myers: Clayborn Bigness Treating Pilot Prindle/Extender: Skipper Cliche in Treatment: 44 Active Problems Location of Pain Severity and Description of Pain Patient Has Paino Yes Site Locations Pain Location: Pain in Ulcers Rate the pain. Current Pain Level: 5 Pain Management and Medication Current Pain Management: Electronic Signature(s) Signed: 06/25/2021 9:26:19 AM By: Gretta Cool, BSN, RN, CWS, Kim RN, BSN Entered By: Gretta Cool, BSN, RN, CWS, Kim on 06/24/2021 09:05:46 Chad [name] ([id number]) -------------------------------------------------------------------------------- Patient/Caregiver Education Details Patient Name: Chad [name] Date of Service: 06/24/2021 9:00 AM Medical Record Number: [id number] Patient Account Number: [id number] Date of Birth/Gender: 1961/06/07 (60 y.o. M) Treating RN: Cornell Barman Primary Care Physician: SYSTEM, PCP Other Clinician: Referring Physician: Clayborn Bigness Treating Physician/Extender: Skipper Cliche in Treatment: 10 Education Assessment Education Provided To: Patient Education Topics Provided Infection: Handouts: Infection Prevention and Management, Other: take antibiotics as prescribed Methods: Demonstration Responses: State content correctly Wound/Skin Impairment: Handouts: Caring for Your Ulcer Methods: Demonstration, Explain/Verbal Responses: State  content correctly Electronic Signature(s) Signed: 06/25/2021 9:26:19 AM By: Gretta Cool, BSN, RN, CWS, Kim RN, BSN Entered By: Gretta Cool, BSN, RN, CWS, Kim on 06/24/2021 09:41:09 Chad [name]  ([id number]) -------------------------------------------------------------------------------- Wound Assessment Details Patient Name: Chad [name] Date of Service: 06/24/2021 9:00 AM Medical Record Number: [id number] Patient Account Number: [id number] Date of Birth/Sex: 11-10-1960 (60 y.o. M) Treating RN: Cornell Barman Primary Care : SYSTEM, PCP Other Clinician: Referring : Clayborn Bigness Treating /Extender: Skipper Cliche in Treatment: 44 Wound Status Wound Number: 8 Primary Venous Leg Ulcer Etiology: Wound Location: Left, Medial Ankle Wound Open Wounding Event: Gradually Appeared Status: Date Acquired: 07/12/2019 Comorbid Anemia, Chronic Obstructive Pulmonary Disease (COPD), Weeks Of Treatment: 44 History: Hepatitis C, Received Chemotherapy Clustered Wound: No Photos Wound Measurements Length: (cm) 1 Width: (cm) 0.6 Depth: (cm) 0.1 Area: (cm) 0.471 Volume: (cm) 0.047 % Reduction in Area: 92% % Reduction in Volume: 92% Epithelialization: Medium (34-66%) Wound Description Classification: Full Thickness Without Exposed Support Structures Wound Margin: Flat and Intact Exudate Amount: Medium Exudate Type: Serosanguineous Exudate Color: red, brown Foul Odor After Cleansing: No Slough/Fibrino Yes Wound Bed Granulation Amount: Large (67-100%) Exposed Structure Granulation Quality: Red Fascia Exposed: No Necrotic Amount: Small (1-33%) Fat Layer (Subcutaneous Tissue) Exposed: Yes Necrotic Quality: Adherent Slough Tendon Exposed: No Muscle Exposed: No Joint Exposed: No Bone Exposed: No Treatment Notes Wound #8 (Ankle) Wound Laterality: Left, Medial Cleanser Normal Saline Discharge Instruction: Wash your hands with soap and water. Remove old dressing, discard into plastic bag and place into trash. Cleanse the wound with Normal Saline prior to applying a clean dressing using gauze sponges, not tissues or cotton balls. Do not scrub or use excessive  force. Pat dry using gauze sponges, not tissue or cotton balls. ZAYDN, GUTRIDGE ([id number]) Peri-Wound Care Desitin Maximum Strength Ointment 4 (oz) Discharge Instruction: Apply zinc periwound Topical Primary Dressing Hydrofera Blue Ready Transfer Foam, 2.5x2.5 (in/in) Discharge Instruction: Apply Hydrofera Blue Ready to wound bed as directed (EXTRA SENT WITH PATIENT, PLEASE USE) Secondary Dressing Kerlix 4.5 x 4.1 (in/yd) Discharge Instruction: Apply kerlix over ABD pad Secured With 89M [place] Surgical Tape, 2x2 (in/yd) Discharge Instruction: Secure kerlix Compression Wrap Compression Stockings Add-Ons Electronic Signature(s) Signed: 06/25/2021 9:26:19 AM By: Gretta Cool, BSN, RN, CWS, Kim RN, BSN Entered By: Gretta Cool, BSN, RN, CWS, Kim on 06/24/2021 09:12:14 Chad [name] ([id number]) -------------------------------------------------------------------------------- Wound Assessment Details Patient Name: Chad [name] Date of Service: 06/24/2021 9:00 AM Medical Record Number: [id number] Patient Account Number: [id number] Date of Birth/Sex: 27-Mar-1961 (60 y.o. M) Treating RN: Cornell Barman Primary Care : SYSTEM, PCP Other Clinician: Referring : Clayborn Bigness Treating /Extender: Skipper Cliche in Treatment: 44 Wound Status Wound Number: 9 Primary Pressure Ulcer Etiology: Wound Location: Right Toe Second Wound Open Wounding Event: Gradually Appeared Status: Date Acquired: 05/31/2021 Comorbid Anemia, Chronic Obstructive Pulmonary Disease (COPD), Weeks Of Treatment: 1 History: Hepatitis C, Received Chemotherapy Clustered Wound: No Photos Wound Measurements Length: (cm) 0.4 Width: (cm) 0.7 Depth: (cm) 0.2 Area: (cm) 0.22 Volume: (cm) 0.044 % Reduction in Area: 41.6% % Reduction in Volume: 41.3% Epithelialization: None Wound Description Classification: Category/Stage IV Exudate Amount: Medium Exudate Type: Serosanguineous Exudate  Color: red, brown Foul Odor After Cleansing: No Slough/Fibrino Yes Wound Bed Granulation Amount: Small (1-33%) Exposed Structure Granulation Quality: Pink Fascia Exposed: No Necrotic Amount: Large (67-100%) Fat Layer (Subcutaneous Tissue) Exposed: Yes Necrotic Quality: Adherent Slough Tendon Exposed: Yes Muscle Exposed: No Joint Exposed: Yes Bone Exposed: Yes Treatment Notes Wound #9 (Toe Second) Wound Laterality: Right Cleanser  Normal Saline Discharge Instruction: Wash your hands with soap and water. Remove old dressing, discard into plastic bag and place into trash. Cleanse the wound with Normal Saline prior to applying a clean dressing using gauze sponges, not tissues or cotton balls. Do not scrub or use excessive force. Pat dry using gauze sponges, not tissue or cotton balls. BLAND, RUDZINSKI ([id number]) Peri-Wound Care Desitin Maximum Strength Ointment 4 (oz) Discharge Instruction: Apply zinc periwound Topical Primary Dressing Hydrofera Blue Ready Transfer Foam, 2.5x2.5 (in/in) Discharge Instruction: Apply Hydrofera Blue Ready to wound bed as directed (EXTRA SENT WITH PATIENT, PLEASE USE) Secondary Dressing Coverlet Latex-Free Fabric Adhesive Dressings Discharge Instruction: 1.5 x 2 Secured With 69M Medipore H Soft Cloth Surgical Tape, 2x2 (in/yd) Discharge Instruction: Secure kerlix Compression Wrap Compression Stockings Add-Ons Electronic Signature(s) Signed: 06/25/2021 9:26:19 AM By: Gretta Cool, BSN, RN, CWS, Kim RN, BSN Entered By: Gretta Cool, BSN, RN, CWS, Kim on 06/24/2021 09:21:59 Chad [name] ([id number]) -------------------------------------------------------------------------------- Ward Details Patient Name: Chad [name] Date of Service: 06/24/2021 9:00 AM Medical Record Number: [id number] Patient Account Number: [id number] Date of Birth/Sex: [birth year] (60 y.o. M) Treating RN: Cornell Barman Primary Care Aymee Fomby: SYSTEM, PCP Other Clinician: Referring  Chermaine Schnyder: Clayborn Bigness Treating Catheleen Langhorne/Extender: Skipper Cliche in Treatment: 44 Vital Signs Time Taken: 09:05 Temperature (F): 98.3 Height (in): 67 Pulse (bpm): 84 Weight (lbs): 160 Respiratory Rate (breaths/min): 16 Body Mass Index (BMI): 25.1 Blood Pressure (mmHg): 117/75 Reference Range: 80 - 120 mg / dl Electronic Signature(s) Signed: 06/25/2021 9:26:19 AM By: Gretta Cool, BSN, RN, CWS, Kim RN, BSN Entered By: Gretta Cool, BSN, RN, CWS, Kim on 06/24/2021 09:05:27

## 2021-07-02 ENCOUNTER — Other Ambulatory Visit
Admission: RE | Admit: 2021-07-02 | Discharge: 2021-07-02 | Disposition: A | Discharge status: Home / Self Care | Payer: Medicaid Other | Source: Ambulatory Visit | Attending: Physician Assistant | Admitting: Physician Assistant

## 2021-07-02 ENCOUNTER — Other Ambulatory Visit: Payer: Self-pay | Admitting: General Surgery

## 2021-07-02 ENCOUNTER — Encounter: Payer: Medicaid Other | Attending: Physician Assistant | Admitting: Physician Assistant

## 2021-07-02 ENCOUNTER — Other Ambulatory Visit: Payer: Self-pay

## 2021-07-02 ENCOUNTER — Telehealth: Payer: Self-pay | Admitting: Gastroenterology

## 2021-07-02 DIAGNOSIS — L089 Local infection of the skin and subcutaneous tissue, unspecified: Secondary | ICD-10-CM | POA: Diagnosis present

## 2021-07-02 DIAGNOSIS — L97322 Non-pressure chronic ulcer of left ankle with fat layer exposed: Secondary | ICD-10-CM | POA: Insufficient documentation

## 2021-07-02 DIAGNOSIS — B1920 Unspecified viral hepatitis C without hepatic coma: Secondary | ICD-10-CM | POA: Insufficient documentation

## 2021-07-02 DIAGNOSIS — L89894 Pressure ulcer of other site, stage 4: Secondary | ICD-10-CM | POA: Insufficient documentation

## 2021-07-02 DIAGNOSIS — I69354 Hemiplegia and hemiparesis following cerebral infarction affecting left non-dominant side: Secondary | ICD-10-CM | POA: Insufficient documentation

## 2021-07-02 DIAGNOSIS — I872 Venous insufficiency (chronic) (peripheral): Secondary | ICD-10-CM | POA: Insufficient documentation

## 2021-07-02 NOTE — Telephone Encounter (Signed)
Inbound call from Jettie Booze requesting a call back to sch appt for colonoscopy. Best contact info is [phone]. Thank you.

## 2021-07-02 NOTE — Progress Notes (Signed)
Subjective:     Patient ID: Chad [name] is a 60 y.o. male.   HPI   The following portions of the patient's history were reviewed and updated as appropriate.   This an established patient is here today for: office visit. Patient reports he still has constipation. The patient reports bleeding with hard stools.    The patient is a resident at [facility].  The patient reports that the food is not the best, but he is maintaining his weight.      Chief Complaint  Patient presents with   Pre-op Exam      BP 122/72   Pulse 67   Temp 36.7 C (98 F)   Ht 175.3 cm (5\' 9" )   Wt 73 kg (161 lb)   SpO2 99%   BMI 23.78 kg/m        Past Medical History:  Diagnosis Date   Anemia     Anxiety     ARF (acute renal failure) (CMS-HCC)     Bladder cancer (CMS-HCC)     COPD (chronic obstructive pulmonary disease) (CMS-HCC)     COPD (chronic obstructive pulmonary disease) (CMS-HCC)     Depression     Dysphagia     Family history of colon cancer     Family history of kidney cancer     Family history of leukemia     GERD (gastroesophageal reflux disease)     Hepatitis     Hepatitis C, chronic (CMS-HCC)     History of stroke     Hydronephrosis     Hyperlipidemia     Hypertension     Malignant neoplasm of colon (CMS-HCC)     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    Dr Vicente Males   Nerve pain     Peripheral vascular disease (CMS-HCC)     Prostate cancer (CMS-HCC)     Ureteral cancer, 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   Urinary frequency     Venous hypertension of lower extremity, bilateral             Past Surgical History:  Procedure Laterality Date   ANGIOGRAPHY LOWER EXTREMITY   05/30/2019    Dr.Jason Dew, 10.26.20    ANGIOGRAPHY LOWER EXTREMITY Left 02/20/2020   ANGIOGRAPHY LOWER EXTREMITY Right 02/13/2020   colon surgery    07/2017    [place] Extended right hemicolectomy    COLONOSCOPY   11/06/2020   CYSTOSCOPY        With retrogrades right 2.3.20     CYSTOSCOPY Right 11/21/2019    with stent   CYSTOSCOPY   03/07/2019   cystoscopy with sten placement  Right 08/30/2018    Surgeon Kandis Fantasia, MD, 8.10.20,9.29.19   [place] cath insertion    02/28/2019    Dr Lucky Cowboy   tumor removed               Social History           Socioeconomic History   Marital status: Single  Tobacco Use   Smoking status: Current Every Day Smoker   Smokeless tobacco: Never Used  Substance and Sexual Activity   Alcohol use: Not Currently   Drug use: Not Currently      Comment: history of maijuana use   Sexual activity: Defer            Allergies  Allergen Reactions   Penicillins Rash      Current Medications        Current Outpatient Medications  Medication  Sig Dispense Refill   acetaminophen (TYLENOL) 325 MG tablet Take 650 mg by mouth 3 (three) times a day       aspirin 81 MG EC tablet Take 1 tablet by mouth once daily       carboxymethylcellulose sodium 1 % Drop Apply to eye 2 (two) times daily       cholecalciferol (VITAMIN D3) 2,000 unit capsule Take 2,000 Units by mouth once daily       clopidogreL (PLAVIX) 75 mg tablet Take 1 tablet by mouth once daily       cyclobenzaprine (FLEXERIL) 10 MG tablet Take 10 mg by mouth nightly       gabapentin (NEURONTIN) 100 MG capsule Take 100 mg by mouth 3 (three) times a day       gentamicin 0.1 % cream Apply topically once daily       mirtazapine (REMERON) 7.5 MG tablet Take 7.5 mg by mouth nightly       oxyCODONE (DAZIDOX) 10 mg immediate release tablet Take 10 mg by mouth 4 (four) times daily       pantoprazole (PROTONIX) 40 MG DR tablet Take 40 mg by mouth once daily       sennosides-docusate (SENOKOT-S) 8.6-50 mg tablet Take 1 tablet by mouth once daily       silver-calcium alginate 2 X 2 " Bndg Apply topically once daily       Zinc Mth/Copper/Saw Palm/Gnsg (PROSTATE HEALTH FORMULA ORAL) Take 30 mLs by mouth 2 (two) times daily       atorvastatin (LIPITOR) 10 MG tablet Take 1 tablet by mouth once  daily       collagenase (SANTYL) ointment Apply 1 Application topically once daily (Patient not taking: No sig reported)       diclofenac (VOLTAREN) 1 % topical gel Apply 2 g topically 4 (four) times daily as needed (Patient not taking: No sig reported)       escitalopram oxalate (LEXAPRO) 5 MG tablet Take 5 mg by mouth once daily (Patient not taking: No sig reported)        No current facility-administered medications for this visit.             Family History  Problem Relation Age of Onset   Prostate cancer Father     Cancer Brother     Leukemia Son     Cancer Paternal Uncle     Cancer Maternal Grandmother     Kidney cancer Paternal Grandfather          Labs and Radiology:    November 07, 2020 colonoscopy reviewed:   Description of a 25 mm polypoid lesion in the sigmoid colon granular, lateral spreading.  Image labeling states descending colon.   A. COLON MASS, SIGMOID; COLD BIOPSY:  - FOCAL MUCIN POOLS WITH CARCINOMA (MUCINOUS CARCINOMA), SEE COMMENT.  - INFLAMED GRANULATION TISSUE CONSISTENT WITH ULCERATION.  - DEEPER SECTIONS EXAMINED.    Small polyps in the rectum.  Evidence of prior right hemicolectomy.   April 30, 2021 CT of the chest abdomen and pelvis:   This study was independently reviewed.   IMPRESSION: 1. Interval development of a 13 mm lobular polypoid lesion posterior wall right mainstem bronchus. This does appear to have some trace gas in it laterally suggesting that it represents mucus/secretion. Polypoid endobronchial lesion is considered much less likely but not excluded. Follow-up CT chest in 4-6 weeks could be used to re-evaluate. 2. The irregular/ill-defined wall thickening seen previously in the right renal  pelvis and proximal ureter appears decreased in the interval with less enhancing tissue visible on today's study, but this is a subtle change. 3. Stable right double-J internal ureteral stent with similar appearance of mild right  hydronephrosis and cortical thinning in the right kidney. 4. Stable appearance of the wall thickening along the proximal sigmoid colon. This region was noted to be hypermetabolic on previous PET-CT. 5. Stable 10 mm short axis portal caval lymph node. Scattered upper normal lymph nodes seen in the pelvis bilaterally on the prior study have decreased in the interval. No pelvic lymphadenopathy by CT size criteria on today's study. 6. Aortic Atherosclerosis (ICD10-I70.0).   May 01, 2021 laboratory:   Component Ref Range & Units 2 wk ago  (05/01/21) 1 mo ago  (04/10/21) 1 mo ago  (03/20/21) 2 mo ago  (02/20/21) 3 mo ago  (01/30/21) 4 mo ago  (01/07/21) 5 mo ago  (12/17/20)  WBC 4.0 - 10.5 K/uL 7.6  6.7  6.7  7.1  7.7  6.1  8.1   RBC 4.22 - 5.81 MIL/uL 4.72  4.55  4.59  4.73  4.88  4.84  5.13   Hemoglobin 13.0 - 17.0 g/dL 11.7 Low   11.0 Low   11.1 Low   11.6 Low   12.2 Low   12.4 Low   12.7 Low    HCT 39.0 - 52.0 % 37.2 Low   35.2 Low   35.9 Low   37.4 Low   38.8 Low   38.7 Low   40.2   MCV 80.0 - 100.0 fL 78.8 Low   77.4 Low   78.2 Low   79.1 Low   79.5 Low   80.0  78.4 Low    MCH 26.0 - 34.0 pg 24.8 Low   24.2 Low   24.2 Low   24.5 Low   25.0 Low   25.6 Low   24.8 Low    MCHC 30.0 - 36.0 g/dL 31.5  31.3  30.9  31.0  31.4  32.0  31.6   RDW 11.5 - 15.5 % 17.6 High   15.4  14.6  14.9  15.7 High   17.6 High   18.9 High    Platelets 150 - 400 K/uL 185  219  271  180  216  176  196   nRBC 0.0 - 0.2 % 0.0  0.0  0.0  0.0  0.0  0.0  0.0   Neutrophils Relative % % 68  67  64  64  66  65  71   Neutro Abs 1.7 - 7.7 K/uL 5.2  4.4  4.4  4.5  5.1  3.9  5.7   Lymphocytes Relative % 18  18  19  21  19  21  17    Lymphs Abs 0.7 - 4.0 K/uL 1.4  1.2  1.3  1.5  1.5  1.3  1.4   Monocytes Relative % 7  8  9  8  8  8  7    Monocytes Absolute 0.1 - 1.0 K/uL 0.5  0.6  0.6  0.6  0.6  0.5  0.5   Eosinophils Relative % 5  6  6  6  6  5  4      Component Ref Range & Units 6 mo ago  (10/29/20) 7 mo ago  (10/08/20) 8 mo  ago  (09/17/20) 8 mo ago  (08/27/20) 9 mo ago  (08/06/20) 10 mo ago  (07/13/20) 11 mo ago  (05/31/20)  WBC  4.0 - 10.5 K/uL 7.0  7.9  6.7  10.3  9.1  9.3  9.1   RBC 4.22 - 5.81 MIL/uL 5.25  5.00  4.92  5.05  5.17  5.04  4.88   Hemoglobin 13.0 - 17.0 g/dL 12.3 Low   11.5 Low   11.4 Low   12.1 Low   12.2 Low   12.4 Low   13.1   HCT 39.0 - 52.0 % 40.2  36.7 Low   36.7 Low   37.5 Low   39.2  38.8 Low   39.7   MCV 80.0 - 100.0 fL 76.6 Low   73.4 Low   74.6 Low   74.3 Low   75.8 Low   77.0 Low   81.4   MCH 26.0 - 34.0 pg 23.4 Low   23.0 Low   23.2 Low   24.0 Low   23.6 Low   24.6 Low   26.8   MCHC 30.0 - 36.0 g/dL 30.6  31.3  31.1  32.3  31.1  32.0  33.0   RDW 11.5 - 15.5 % 19.2 High   16.3 High   15.9 High   16.0 High   15.1  14.1  13.6   Platelets 150 - 400 K/uL 222  224  200  265  255  272  287   nRBC 0.0 - 0.2 % 0.0  0.0  0.0  0.0  0.0  0.0  0.0   Neutrophils Relative % % 62  70  68  74  67  71  70   Neutro Abs 1.7 - 7.7 K/uL 4.3  5.5  4.6  7.7  6.0  6.6  6.4   Lymphocytes Relative % 23  17  19  13  19  16  16    Lymphs Abs 0.7 - 4.0 K/uL 1.6  1.3  1.3  1.4  1.8  1.5  1.4     Component Ref Range & Units 2 wk ago   Sodium 135 - 145 mmol/L 137   Potassium 3.5 - 5.1 mmol/L 4.1   Chloride 98 - 111 mmol/L 104   CO2 22 - 32 mmol/L 26   Glucose, Bld 70 - 99 mg/dL 84   Comment: Glucose reference range applies only to samples taken after fasting for at least 8 hours.  BUN 8 - 23 mg/dL 21   Creatinine, Ser 0.61 - 1.24 mg/dL 0.87   Calcium 8.9 - 10.3 mg/dL 8.5 Low    Total Protein 6.5 - 8.1 g/dL 7.7   Albumin 3.5 - 5.0 g/dL 3.7   AST 15 - 41 U/L 38   ALT 0 - 44 U/L 27   Alkaline Phosphatase 38 - 126 U/L 125   Total Bilirubin 0.3 - 1.2 mg/dL 0.5   GFR, Estimated >60 mL/min >60   Comment: (NOTE)  Calculated using the CKD-EPI Creatinine Equation (2021)   Anion gap 5 - 15 7           Review of Systems  Constitutional: Negative for chills and fever.  Respiratory: Negative for cough.           Objective:   Physical Exam Constitutional:      Appearance: Normal appearance.  Cardiovascular:     Rate and Rhythm: Normal rate and regular rhythm.     Pulses: Normal pulses.     Heart sounds: Normal heart sounds.  Pulmonary:     Effort: Pulmonary effort is normal.     Breath sounds: Normal  breath sounds.  Musculoskeletal:     Cervical back: Neck supple.  Neurological:     Mental Status: He is alert and oriented to person, place, and time.  Psychiatric:        Mood and Affect: Mood normal.        Behavior: Behavior normal.           Assessment:     Candidate for repeat colonoscopy to assess disease progression of the sigmoid colon lesion.   Remarkably stable ureteropelvic carcinoma disease.   CT abnormality of the chest perhaps unrelated to a new metastatic/primary foci.    Plan:     The case has been discussed in detail with Dr. [name] from medical oncology.  We will repeat his endoscopy and make a decision at that time of whether the patient could be considered a candidate for focal resection.  His mobility issues, barely bed to chair, would make a formal left colectomy resection likely leave him with 6+/- stools per day which might present a significant hygiene problem.  It would be ideal to avoid a stoma in this gentleman.   A more limited resection may be considered.  Based on review of the CT and PET scan, it is unlikely that a simple endoscopic resection could be completed without high risk of violating the bowel serosa.  The area could be kept "in check" if it develops a polypoid component with serial "trimming". Patient to be scheduled for a colonoscopy on 06-05-21 at [facility]. Magda Paganini at [facility] has been notified of this. She is aware to call [facility] on 06-04-21 between 1 and 3 pm to get patient's arrival time- [phone]. Prescriptions and colonoscopy instructions have been faxed to [facility] to the nurse's attention as requested today  (Fax: [phone]).     This note is partially prepared by Ledell Noss, CMA acting as a scribe in the presence of Dr. Hervey Ard, MD.    The documentation recorded by the scribe accurately reflects the service I personally performed and the decisions made by me.    Keilon Bellow, MD FACS

## 2021-07-02 NOTE — Telephone Encounter (Signed)
Dr. Vicente Males, can you please let me know if patient needs to schedule a colonoscopy?

## 2021-07-02 NOTE — Telephone Encounter (Signed)
Dr [name] - I reviewed his chart- do not see any indication for a repeat colonoscopy at this time as we know he has colon cancer and not had surgery - let me know if needed   [facility]

## 2021-07-02 NOTE — Progress Notes (Signed)
Chad [name], Chad [name] ([id number]) Visit Report for 07/02/2021 Arrival Information Details Patient Name: Chad, [name] Date of Service: 07/02/2021 9:45 AM Medical Record Number: [id number] Patient Account Number: [id number] Date of Birth/Sex: Apr 17, 1961 (60 y.o. M) Treating RN: Cornell Barman Primary Care Alayja Armas: SYSTEM, PCP Other Clinician: Referring Monea Pesantez: Clayborn Bigness Treating Ericia Moxley/Extender: Skipper Cliche in Treatment: 46 Visit Information History Since Last Visit Added or deleted any medications: No Patient Arrived: Wheel Chair Has Dressing in Place as Prescribed: Yes Arrival Time: 10:01 Pain Present Now: Yes Accompanied By: self Transfer Assistance: None Patient Identification Verified: Yes Secondary Verification Process Completed: Yes Patient Requires Transmission-Based No Precautions: Patient Has Alerts: Yes Patient Alerts: Patient on Blood Thinner ABI right 1.16 ABI left 1.14 Electronic Signature(s) Signed: 07/02/2021 3:50:05 PM By: Gretta Cool, BSN, RN, CWS, Kim RN, BSN Entered By: Gretta Cool, BSN, RN, CWS, Kim on 07/02/2021 10:01:54 Chad [name] ([id number]) -------------------------------------------------------------------------------- Encounter Discharge Information Details Patient Name: Chad [name] Date of Service: 07/02/2021 9:45 AM Medical Record Number: [id number] Patient Account Number: [id number] Date of Birth/Sex: [birth year] (60 y.o. M) Treating RN: Cornell Barman Primary Care Lajune Perine: SYSTEM, PCP Other Clinician: Referring Rosalee Tolley: Clayborn Bigness Treating Devantae Babe/Extender: Skipper Cliche in Treatment: 46 Encounter Discharge Information Items Post Procedure Vitals Discharge Condition: Stable Temperature (F): 98.5 Ambulatory Status: Wheelchair Pulse (bpm): 94 Discharge Destination: [place] Respiratory Rate (breaths/min): 16 Orders Sent: Yes Blood Pressure (mmHg): 105/72 Transportation: Other Accompanied By: self Schedule Follow-up  Appointment: Yes Clinical Summary of Care: Electronic Signature(s) Signed: 07/02/2021 3:50:05 PM By: Gretta Cool, BSN, RN, CWS, Kim RN, BSN Entered By: Gretta Cool, BSN, RN, CWS, Kim on 07/02/2021 10:48:13 Chad [name] ([id number]) -------------------------------------------------------------------------------- Lower Extremity Assessment Details Patient Name: Chad [name] Date of Service: 07/02/2021 9:45 AM Medical Record Number: [id number] Patient Account Number: [id number] Date of Birth/Sex: 08-28-1960 (60 y.o. M) Treating RN: Cornell Barman Primary Care Londin Antone: SYSTEM, PCP Other Clinician: Referring Anwitha Mapes: Clayborn Bigness Treating Zhania Shaheen/Extender: Skipper Cliche in Treatment: 46 Edema Assessment Assessed: [Left: Yes] [Right: Yes] Edema: [Left: No] [Right: No] Vascular Assessment Pulses: Dorsalis Pedis Palpable: [Left:Yes] [Right:Yes] Notes Patient has palpable pulses bilateraly. Electronic Signature(s) Signed: 07/02/2021 3:50:05 PM By: Gretta Cool, BSN, RN, CWS, Kim RN, BSN Entered By: Gretta Cool, BSN, RN, CWS, Kim on 07/02/2021 10:16:57 Chad [name] ([id number]) -------------------------------------------------------------------------------- Multi Wound Chart Details Patient Name: Chad [name] Date of Service: 07/02/2021 9:45 AM Medical Record Number: [id number] Patient Account Number: [id number] Date of Birth/Sex: [birth year] (60 y.o. M) Treating RN: Cornell Barman Primary Care Freman Lapage: SYSTEM, PCP Other Clinician: Referring Norleen Xie: Clayborn Bigness Treating Michail Boyte/Extender: Skipper Cliche in Treatment: 46 Vital Signs Height(in): 28 Pulse(bpm): 85 Weight(lbs): 160 Blood Pressure(mmHg): 106/72 Body Mass Index(BMI): 25 Temperature(F): 98.5 Respiratory Rate(breaths/min): 16 Photos: [N/A:N/A] Wound Location: Left, Medial Ankle Right Toe Second N/A Wounding Event: Gradually Appeared Gradually Appeared N/A Primary Etiology: Venous Leg Ulcer Pressure Ulcer N/A Comorbid History:  Anemia, Chronic Obstructive Anemia, Chronic Obstructive N/A Pulmonary Disease (COPD), Pulmonary Disease (COPD), Hepatitis C, Received Hepatitis C, Received Chemotherapy Chemotherapy Date Acquired: 07/12/2019 05/31/2021 N/A Weeks of Treatment: 46 3 N/A Wound Status: Open Open N/A Measurements L x W x D (cm) 1x0.6x0.1 0.3x0.7x0.2 N/A Area (cm) : 0.471 0.165 N/A Volume (cm) : 0.047 0.033 N/A % Reduction in Area: 92.00% 56.20% N/A % Reduction in Volume: 92.00% 56.00% N/A Classification: Full Thickness Without Exposed Category/Stage IV N/A Support Structures Exudate Amount: Medium Medium N/A Exudate Type: Serosanguineous Purulent N/A Exudate Color: red, brown yellow, brown, green N/A Wound Margin: Flat and Intact Flat and Intact N/A Granulation Amount:  Large (67-100%) Small (1-33%) N/A Granulation Quality: Red Pink, Hyper-granulation N/A Necrotic Amount: Small (1-33%) Large (67-100%) N/A Necrotic Tissue: Eschar, Adherent [place] N/A Exposed Structures: Fat Layer (Subcutaneous Tissue): Fat Layer (Subcutaneous Tissue): N/A Yes Yes Fascia: No Tendon: Yes Tendon: No Joint: Yes Muscle: No Bone: Yes Joint: No Fascia: No Bone: No Muscle: No Howes, Rodrigo ([id number]) Epithelialization: Medium (34-66%) None N/A Assessment Notes: N/A Right second toe red and swollen. N/A Purulent drainage prior to cleaning. Treatment Notes Electronic Signature(s) Signed: 07/02/2021 3:50:05 PM By: Gretta Cool, BSN, RN, CWS, Kim RN, BSN Entered By: Gretta Cool, BSN, RN, CWS, Kim on 07/02/2021 10:28:34 Chad [name] ([id number]) -------------------------------------------------------------------------------- Multi-Disciplinary Care Plan Details Patient Name: Chad [name] Date of Service: 07/02/2021 9:45 AM Medical Record Number: [id number] Patient Account Number: [id number] Date of Birth/Sex: July 12, 1961 (60 y.o. M) Treating RN: Cornell Barman Primary Care Catrena Vari: SYSTEM, PCP Other  Clinician: Referring Tacoya Altizer: Clayborn Bigness Treating Haylen Shelnutt/Extender: Skipper Cliche in Treatment: 46 Active Inactive Necrotic Tissue Nursing Diagnoses: Impaired tissue integrity related to necrotic/devitalized tissue Knowledge deficit related to management of necrotic/devitalized tissue Goals: Necrotic/devitalized tissue will be minimized in the wound bed Date Initiated: 05/20/2021 Target Resolution Date: 05/20/2021 Goal Status: Active Patient/caregiver will verbalize understanding of reason and process for debridement of necrotic tissue Date Initiated: 05/20/2021 Date Inactivated: 06/17/2021 Target Resolution Date: 05/20/2021 Goal Status: Met Interventions: Assess patient pain level pre-, during and post procedure and prior to discharge Provide education on necrotic tissue and debridement process Treatment Activities: Apply topical anesthetic as ordered : 05/20/2021 Notes: Osteomyelitis Nursing Diagnoses: Infection: osteomyelitis Knowledge deficit related to disease process and management Goals: Diagnostic evaluation for osteomyelitis completed as ordered Date Initiated: 06/24/2021 Target Resolution Date: 06/21/2021 Goal Status: Active Patient/caregiver will verbalize understanding of disease process and disease management Date Initiated: 06/24/2021 Target Resolution Date: 06/24/2021 Goal Status: Active Patient's osteomyelitis will resolve Date Initiated: 06/24/2021 Target Resolution Date: 07/22/2021 Goal Status: Active Signs and symptoms for osteomyelitis will be recognized and promptly addressed Date Initiated: 06/24/2021 Target Resolution Date: 06/24/2021 Goal Status: Active Interventions: Assess for signs and symptoms of osteomyelitis resolution every visit Provide education on osteomyelitis Treatment Activities: MRI : 06/21/2021 Systemic antibiotics : 06/24/2021 Notes: Chad [name], Chad [name] ([id number]) Electronic Signature(s) Signed: 07/02/2021 3:50:05 PM  By: Gretta Cool, BSN, RN, CWS, Kim RN, BSN Entered By: Gretta Cool, BSN, RN, CWS, Kim on 07/02/2021 10:45:37 Chad [name] ([id number]) -------------------------------------------------------------------------------- Pain Assessment Details Patient Name: Chad [name] Date of Service: 07/02/2021 9:45 AM Medical Record Number: [id number] Patient Account Number: [id number] Date of Birth/Sex: [birth year] (60 y.o. M) Treating RN: Cornell Barman Primary Care Grete Bosko: SYSTEM, PCP Other Clinician: Referring Jamila Slatten: Clayborn Bigness Treating Arbadella Kimbler/Extender: Skipper Cliche in Treatment: 46 Active Problems Location of Pain Severity and Description of Pain Patient Has Paino Yes Site Locations Pain Location: Pain in Ulcers Rate the pain. Current Pain Level: 8 Character of Pain Describe the Pain: Sharp, Shooting Pain Management and Medication Current Pain Management: Notes Patient states right toe is killing him. Electronic Signature(s) Signed: 07/02/2021 3:50:05 PM By: Gretta Cool, BSN, RN, CWS, Kim RN, BSN Entered By: Gretta Cool, BSN, RN, CWS, Kim on 07/02/2021 10:05:53 Chad [name] ([id number]) -------------------------------------------------------------------------------- Patient/Caregiver Education Details Patient Name: Chad [name] Date of Service: 07/02/2021 9:45 AM Medical Record Number: [id number] Patient Account Number: [id number] Date of Birth/Gender: [birth year] (60 y.o. M) Treating RN: Cornell Barman Primary Care Physician: SYSTEM, PCP Other Clinician: Referring Physician: Clayborn Bigness Treating Physician/Extender: Skipper Cliche in Treatment: 80 Education Assessment Education Provided To: Patient Education Topics Provided Infection: Handouts: Infection  Prevention and Management Methods: Explain/Verbal Responses: State content correctly Wound Debridement: Handouts: Wound Debridement Methods: Explain/Verbal Responses: State content correctly Wound/Skin Impairment: Handouts: Caring for  Your Ulcer Methods: Explain/Verbal Responses: State content correctly Electronic Signature(s) Signed: 07/02/2021 3:50:05 PM By: Gretta Cool, BSN, RN, CWS, Kim RN, BSN Entered By: Gretta Cool, BSN, RN, CWS, Kim on 07/02/2021 10:37:17 Chad [name] ([id number]) -------------------------------------------------------------------------------- Wound Assessment Details Patient Name: Chad [name] Date of Service: 07/02/2021 9:45 AM Medical Record Number: [id number] Patient Account Number: [id number] Date of Birth/Sex: [birth year] (60 y.o. M) Treating RN: Cornell Barman Primary Care Dwyane Dupree: SYSTEM, PCP Other Clinician: Referring Jyll Tomaro: Clayborn Bigness Treating Drena Ham/Extender: Skipper Cliche in Treatment: 46 Wound Status Wound Number: 8 Primary Venous Leg Ulcer Etiology: Wound Location: Left, Medial Ankle Wound Open Wounding Event: Gradually Appeared Status: Date Acquired: 07/12/2019 Comorbid Anemia, Chronic Obstructive Pulmonary Disease (COPD), Weeks Of Treatment: 46 History: Hepatitis C, Received Chemotherapy Clustered Wound: No Photos Wound Measurements Length: (cm) 1 Width: (cm) 0.6 Depth: (cm) 0.1 Area: (cm) 0.471 Volume: (cm) 0.047 % Reduction in Area: 92% % Reduction in Volume: 92% Epithelialization: Medium (34-66%) Wound Description Classification: Full Thickness Without Exposed Support Structures Wound Margin: Flat and Intact Exudate Amount: Medium Exudate Type: Serosanguineous Exudate Color: red, brown Foul Odor After Cleansing: No Slough/Fibrino Yes Wound Bed Granulation Amount: Large (67-100%) Exposed Structure Granulation Quality: Red Fascia Exposed: No Necrotic Amount: Small (1-33%) Fat Layer (Subcutaneous Tissue) Exposed: Yes Necrotic Quality: Eschar, Adherent Slough Tendon Exposed: No Muscle Exposed: No Joint Exposed: No Bone Exposed: No Treatment Notes Wound #8 (Ankle) Wound Laterality: Left, Medial Cleanser Normal Saline Discharge Instruction: Wash  your hands with soap and water. Remove old dressing, discard into plastic bag and place into trash. Cleanse the wound with Normal Saline prior to applying a clean dressing using gauze sponges, not tissues or cotton balls. Do not scrub or use excessive force. Pat dry using gauze sponges, not tissue or cotton balls. Chad [name], Chad [name] ([id number]) Peri-Wound Care Desitin Maximum Strength Ointment 4 (oz) Discharge Instruction: Apply zinc periwound Topical Primary Dressing Hydrofera Blue Ready Transfer Foam, 2.5x2.5 (in/in) Discharge Instruction: Apply Hydrofera Blue Ready to wound bed as directed (EXTRA SENT WITH PATIENT, PLEASE USE) Secondary Dressing Kerlix 4.5 x 4.1 (in/yd) Discharge Instruction: Apply kerlix over ABD pad Secured With 43M [place] Surgical Tape, 2x2 (in/yd) Discharge Instruction: Secure kerlix Compression Wrap Compression Stockings Add-Ons Electronic Signature(s) Signed: 07/02/2021 3:50:05 PM By: Gretta Cool, BSN, RN, CWS, Kim RN, BSN Entered By: Gretta Cool, BSN, RN, CWS, Kim on 07/02/2021 10:14:10 Chad [name] ([id number]) -------------------------------------------------------------------------------- Wound Assessment Details Patient Name: Chad [name] Date of Service: 07/02/2021 9:45 AM Medical Record Number: [id number] Patient Account Number: [id number] Date of Birth/Sex: December 28, 1960 (60 y.o. M) Treating RN: Cornell Barman Primary Care Enaya Howze: SYSTEM, PCP Other Clinician: Referring Brevin Mcfadden: Clayborn Bigness Treating Deaja Rizo/Extender: Skipper Cliche in Treatment: 46 Wound Status Wound Number: 9 Primary Pressure Ulcer Etiology: Wound Location: Right Toe Second Wound Open Wounding Event: Gradually Appeared Status: Date Acquired: 05/31/2021 Comorbid Anemia, Chronic Obstructive Pulmonary Disease (COPD), Weeks Of Treatment: 3 History: Hepatitis C, Received Chemotherapy Clustered Wound: No Photos Wound Measurements Length: (cm) 0.3 Width: (cm) 0.7 Depth:  (cm) 0.2 Area: (cm) 0.165 Volume: (cm) 0.033 % Reduction in Area: 56.2% % Reduction in Volume: 56% Epithelialization: None Tunneling: No Undermining: No Wound Description Classification: Category/Stage IV Wound Margin: Flat and Intact Exudate Amount: Medium Exudate Type: Purulent Exudate Color: yellow, brown, green Foul Odor After Cleansing: No Slough/Fibrino Yes Wound Bed Granulation Amount: Small (1-33%) Exposed Structure  Granulation Quality: Pink, Hyper-granulation Fascia Exposed: No Necrotic Amount: Large (67-100%) Fat Layer (Subcutaneous Tissue) Exposed: Yes Necrotic Quality: Adherent Slough Tendon Exposed: Yes Muscle Exposed: No Joint Exposed: Yes Bone Exposed: Yes Assessment Notes Right second toe red and swollen. Purulent drainage prior to cleaning. Treatment Notes Wound #9 (Toe Second) Wound Laterality: Right Cleanser Normal Saline Chad [name], Chad [name] ([id number]) Discharge Instruction: Wash your hands with soap and water. Remove old dressing, discard into plastic bag and place into trash. Cleanse the wound with Normal Saline prior to applying a clean dressing using gauze sponges, not tissues or cotton balls. Do not scrub or use excessive force. Pat dry using gauze sponges, not tissue or cotton balls. Peri-Wound Care Desitin Maximum Strength Ointment 4 (oz) Discharge Instruction: Apply zinc periwound Topical Primary Dressing Hydrofera Blue Ready Transfer Foam, 2.5x2.5 (in/in) Discharge Instruction: Apply Hydrofera Blue Ready to wound bed as directed (EXTRA SENT WITH PATIENT, PLEASE USE) Secondary Dressing Coverlet Latex-Free Fabric Adhesive Dressings Discharge Instruction: 1.5 x 2 Secured With 56M Medipore H Soft Cloth Surgical Tape, 2x2 (in/yd) Discharge Instruction: Secure kerlix Compression Wrap Compression Stockings Add-Ons Electronic Signature(s) Signed: 07/02/2021 3:50:05 PM By: Gretta Cool, BSN, RN, CWS, Kim RN, BSN Entered By: Gretta Cool, BSN, RN, CWS, Kim on  07/02/2021 10:16:03 Chad [name] ([id number]) -------------------------------------------------------------------------------- [place] Details Patient Name: Chad [name] Date of Service: 07/02/2021 9:45 AM Medical Record Number: [id number] Patient Account Number: [id number] Date of Birth/Sex: 08-Sep-1960 (60 y.o. M) Treating RN: Cornell Barman Primary Care Atalaya Zappia: SYSTEM, PCP Other Clinician: Referring Shakiara Lukic: Clayborn Bigness Treating Edona Schreffler/Extender: Skipper Cliche in Treatment: 46 Vital Signs Time Taken: 10:04 Temperature (F): 98.5 Height (in): 67 Pulse (bpm): 94 Weight (lbs): 160 Respiratory Rate (breaths/min): 16 Body Mass Index (BMI): 25.1 Blood Pressure (mmHg): 106/72 Reference Range: 80 - 120 mg / dl Electronic Signature(s) Signed: 07/02/2021 3:50:05 PM By: Gretta Cool, BSN, RN, CWS, Kim RN, BSN Entered By: Gretta Cool, BSN, RN, CWS, Kim on 07/02/2021 10:05:05

## 2021-07-02 NOTE — Progress Notes (Addendum)
RAEVON, BROOM ([id number]) Visit Report for 07/02/2021 Chief Complaint Document Details Patient Name: Chad [name], Chad [name] Date of Service: 07/02/2021 9:45 AM Medical Record Number: [id number] Patient Account Number: [id number] Date of Birth/Sex: October 29, 1960 (60 y.o. M) Treating RN: Cornell Barman Primary Care Provider: SYSTEM, PCP Other Clinician: Referring Provider: Clayborn Bigness Treating Provider/Extender: Skipper Cliche in Treatment: 46 Information Obtained from: Patient Chief Complaint Left ankle and right foot ulcers Electronic Signature(s) Signed: 07/02/2021 10:20:24 AM By: Worthy Keeler PA-C Entered By: Worthy Keeler on 07/02/2021 10:20:23 Chad [name] ([id number]) -------------------------------------------------------------------------------- Debridement Details Patient Name: Chad [name] Date of Service: 07/02/2021 9:45 AM Medical Record Number: [id number] Patient Account Number: [id number] Date of Birth/Sex: 1960-12-15 (60 y.o. M) Treating RN: Cornell Barman Primary Care Provider: SYSTEM, PCP Other Clinician: Referring Provider: Clayborn Bigness Treating Provider/Extender: Skipper Cliche in Treatment: 46 Debridement Performed for Wound #8 Left,Medial Ankle Assessment: Performed By: Physician Tommie Sams., PA-C Debridement Type: Debridement Severity of Tissue Pre Debridement: Fat layer exposed Level of Consciousness (Pre- Awake and Alert procedure): Pre-procedure Verification/Time Out Yes - 10:28 Taken: Pain Control: Lidocaine Total Area Debrided (L x W): 1 (cm) x 0.6 (cm) = 0.6 (cm) Tissue and other material Viable, Non-Viable, Callus, Slough, Subcutaneous, Biofilm, Slough debrided: Level: Skin/Subcutaneous Tissue Debridement Description: Excisional Instrument: Curette Bleeding: Minimum Hemostasis Achieved: Pressure Response to Treatment: Procedure was tolerated well Level of Consciousness (Post- Awake and Alert procedure): Post Debridement Measurements of  Total Wound Length: (cm) 1 Width: (cm) 0.6 Depth: (cm) 0.2 Volume: (cm) 0.094 Character of Wound/Ulcer Post Debridement: Stable Severity of Tissue Post Debridement: Fat layer exposed Post Procedure Diagnosis Same as Pre-procedure Electronic Signature(s) Signed: 07/02/2021 12:06:21 PM By: Worthy Keeler PA-C Signed: 07/02/2021 3:50:05 PM By: Gretta Cool, BSN, RN, CWS, Kim RN, BSN Entered By: Gretta Cool, BSN, RN, CWS, Kim on 07/02/2021 10:31:12 Chad [name] ([id number]) -------------------------------------------------------------------------------- HPI Details Patient Name: Chad [name] Date of Service: 07/02/2021 9:45 AM Medical Record Number: [id number] Patient Account Number: [id number] Date of Birth/Sex: 21-May-1961 (60 y.o. M) Treating RN: Cornell Barman Primary Care Provider: SYSTEM, PCP Other Clinician: Referring Provider: Clayborn Bigness Treating Provider/Extender: Skipper Cliche in Treatment: 46 History of Present Illness HPI Description: 10/08/18 on evaluation today patient actually presents to our office for initial evaluation concerning wounds that he has of the bilateral lower extremities. 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 He also has bilateral venous stasis. He apparently has been homeless currently following discharge from the hospital apparently he has been placed at [facility] which is is a skilled nursing facility locally. Nonetheless fortunately he does not show any signs of infection at this time which is good news. In fact several of the wound actually appears to be showing some signs of improvement already in my pinion. There are a couple areas in the left leg in particular there likely gonna require some sharp debridement to help clear away some necrotic tissue and help with more sufficient healing. No  fevers, chills, nausea, or vomiting noted at this time. 10/15/18 on evaluation today patient actually appears to be doing very well in regard to his bilateral lower extremities. He's been tolerating the dressing changes without complication. Fortunately there does not appear to be any evidence of active infection at this time which is great news. Overall I'm actually very pleased with how this has progressed in just one visits  time. Readmission: 08/14/2020 upon evaluation today patient presents for re-evaluation here in our clinic. He is having issues with his left ankle region as well as his right toe and his right heel. He tells me that the toe and heel actually began as a area that was itching that he was scratching and then subsequently opened up into wounds. These may have been abscess areas I presume based on what I am seeing currently. With regard to his left ankle region he tells me this was a similar type occurrence although he does have venous stasis this very well may be more of a venous leg ulcer more than anything. Nonetheless I do believe that the patient would benefit from appropriate and aggressive wound care to try to help get things under better control here. He does have history of a stroke on the left side affecting him to some degree there that he is able to stand although he does have some residual weakness. Otherwise again the patient does have chronic venous insufficiency as previously noted. His arterial studies most recently obtained showed that he had an ABI on the right of 1.16 with a TBI of 0.52 and on the left and ABI of 1.14 with a TBI of 0.81. That was obtained on 06/19/2020. 08/28/2020 upon evaluation today patient appears to be doing decently well in regard to his wounds in general. He has been tolerating the dressing changes without complication. Fortunately there does not appear to be any signs of active infection which is great news. With that being said I think the  [facility] is doing a good job I would recommend that we likely continue with that currently. 09/11/2020 upon evaluation today patient's wounds did not appear to be doing too poorly but again he is not really showing signs of significant improvement with regard to any of the wounds on the right. None of them have Hydrofera Blue on them I am not exactly sure why this is not being followed as the facility did not contact us to let us know of any issues with obtaining dressings or otherwise. With that being said he is supposed to be using Hydrofera Blue on both of the wounds on the right foot as well as the ankle wound on the left side. 09/18/2020 upon evaluation today patient appears to be doing poorly with regard to his wounds. Again right now the left ankle in particular showed signs of extreme maceration. Apparently he was told by someone with staff at [facility] they could not get the [facility]. With that being said this is something that is never been relayed to [place] one way or another. Also the patient subsequently has not supposed to have a border gauze dressing on. He should have an ABD pad and roll gauze to secure as this drains much too much just to have a border gauze dressing to cover. Nonetheless the fact that they are not using the appropriate dressing is directly causing deterioration of the left ankle wound it is significantly worse today compared to what it was previous. I did attempt to call [facility] while the patient was here I called three times and got no one to even pick up the phone. After this I had my for an office coordinator call and she was able to finally get through and leave a message with the D ON as of dictation of this note which is roughly about an hour and a half later I still have not been able to  speak with anyone at the facility. 09/25/2020 upon evaluation today patient actually showing signs of good improvement which is excellent news. He has  been tolerating the dressing changes without complication. Fortunately there is no signs of active infection which is great news. No fevers, chills, nausea, vomiting, or diarrhea. I do feel like the facility has been doing a much better job at taking care of him as far as the dressings are concerned. However the director of nursing never did call me back. 10/09/2020 upon evaluation today patient appears to be doing well with regard to his wound. The toe ulcer did require some debridement but the other 2 areas actually appear to be doing quite well. 10/19/2020 upon evaluation today patient actually appears to be doing very well in regard to his wounds. In fact the heel does appear to be completely healed. The toe is doing better in the medial ankle on the left is also doing better. Overall I think he is headed in the right direction. 10/26/2020 upon evaluation today patient appears to be doing well with regard to his wound. He is showing signs of improvement which is great news and overall I am very pleased with where things stand today. No fevers, chills, nausea, vomiting, or diarrhea. 11/02/2020 upon evaluation today patient appears to be doing well with regard to his wounds. He has been tolerating the dressing changes without complication overall I am extremely pleased with where things stand today. He in regard to the toe is almost completely healed and the medial ankle on the left is doing much better. 11/09/2020 upon evaluation today patient appears to be doing a little poorly in regard to his left medial ankle ulcer. Fortunately there does not appear to be any signs of systemic infection but unfortunately locally he does appear to be infected in fact he has blue-green drainage consistent with Pseudomonas. Chad [name], Chad [name] ([id number]) 11/16/2020 upon evaluation today patient appears to be doing well with regard to his wound. It actually appears to be doing better. I did place him on gentamicin cream  since the Cipro was actually resistant even though he was positive for Pseudomonas on culture. Overall I think that he does seem to be doing better though I am unsure whether or not they have actually been putting the cream on. The patient is not sure that we did talk to the nurse directly and she was going to initiate that treatment. Fortunately there does not appear to be any signs of active infection at this time. No fevers, chills, nausea, vomiting, or diarrhea. 4/28; the area on the right second toe is close to healed. Left medial ankle required debridement 12/07/2020 upon evaluation today patient appears to be doing well with regard to his wounds. In fact the right second toe appears to be completely healed which is great news. Fortunately there does not appear to be any signs of active infection at this time which is also great news. I think we can probably discontinue the gentamicin on top of everything else. 12/14/2020 upon evaluation today patient appears to be doing well with regard to his wound. He is making good progress and overall very pleased with where things stand today. There is no signs of active infection at this time which is great news. 12/28/2020 upon evaluation today patient appears to be doing well with regard to his wounds. He has been tolerating the dressing changes without complication. Fortunately there is no signs of active infection at this time. No fevers, chills, nausea, vomiting,  or diarrhea. 12/28/2020 upon evaluation today patient's wound bed actually showed signs of excellent improvement. He has great epithelization and granulation I do not see any signs of infection overall I am extremely pleased with where things stand at this point. No fevers, chills, nausea, vomiting, or diarrhea. 01/11/2021 upon evaluation today patient appears to be doing well with regard to his wound on his leg. He has been tolerating the dressing changes without complication. Fortunately there  does not appear to be any signs of active infection which is great news. No fevers, chills, nausea, vomiting, or diarrhea. 01/25/2021 upon evaluation today patient appears to be doing well with regard to his wound. He has been tolerating the dressing changes without complication. Fortunately the collagen seems to be doing a great job which is excellent news. No fevers, chills, nausea, vomiting, or diarrhea. 02/08/2021 upon evaluation today patient's wound is actually looking a little bit worse especially in the periwound compared to previous. Fortunately there does not appear to be any signs of infection which is great news with that being said he does have some irritation around the periphery of the wound which has me more concerned. He actually had a dressing on that had not been changed in 3 days. He also is supposed to have daily dressing changes. With regard to the dressing applied he had a silver alginate dressing and silver collagen is what is recommended and ordered. He also had no Desitin around the edges of the wound in the periwound region although that is on the order inspect to be done as well. In general I was very concerned I did contact [facility] actually spoke with Magda Paganini who is the scheduling individual and subsequently she stated that she would pass the information to the D ON apparently the D ON was not available to talk to me when I call today. 02/18/2021 upon evaluation today patient's wound is actually showing signs of improvement. Fortunately there does not appear to be any evidence of infection which is great news overall I am extremely pleased with where things stand today. No fevers, chills, nausea, vomiting, or diarrhea. 8/3; patient presents for 1 week follow-up. He has no issues or complaints today. He denies signs of infection. 03/11/2021 upon evaluation today patient appears to be doing well with regard to his wound. He does have a little bit of slough noted on  the surface of the wound but fortunately there does not appear to be any signs of active infection at this time. No fevers, chills, nausea, vomiting, or diarrhea. 03/18/2021 upon evaluation today patient appears to be doing well with regard to his wound. He has been tolerating the dressing changes without complication. There was a little irritation more proximal to where the wound was that was not noted last week but nonetheless this is very superficial just seems to be more irritation we just need to make sure to put a good amount of the zinc over the area in my opinion. Otherwise he does not seem to be doing significantly worse at all which is great news. 03/25/2021 upon evaluation today patient appears to be doing well with regard to his wound. He is going require some sharp debridement today to clear with some of the necrotic debris. I did perform this today without complication postdebridement wound bed appears to be doing much better this is great news. 04/08/2021 upon evaluation today patient appears to be doing decently well in regard to his wound although the overall measurement is not significantly  smaller compared to previous. It is gone down a little bit but still the facility continues to not really put the appropriate dressings in place in fact he was supposed to have collagen we think he probably had more of an allergy to At this point. Fortunately there does not appear to be any signs of active infection systemically though locally I do not see anything on initial visualization either as far as erythema or warmth. 04/15/2021 upon evaluation today patient appears to be doing well with regard to his wound. He is actually showing signs of improvement. I did place him on antibiotics last week, Cipro. He has been taking that 2 times a day and seems to be tolerating it very well. I do not see any evidence of worsening and in fact the overall appearance of the wound is smaller today which is  also great news. 9/26; left medial ankle chronic venous insufficiency wound is improved. Using Hydrofera Blue 10/10; left medial ankle chronic venous insufficiency. Wound has not changed much in appearance completely nonviable surface. Apparently there have been problems getting the right product on the wound at the facility although he came in with [facility] on today 05/14/2021 upon evaluation today patient appears to be doing well with regard to his wound. I think he is making progress here which is good news. Fortunately there does not appear to be any signs of active infection at this time. No fevers, chills, nausea, vomiting, or diarrhea. 05/20/2021 upon evaluation today patient appears to be doing well with regard to his wound. He is showing signs of good improvement which is great news. There does not appear to be any evidence of active infection which is also excellent news. No fevers, chills, nausea, vomiting, or diarrhea. 05/28/2021 upon evaluation today patient appears to be doing quite well. There does not appear to be any signs of active infection at this time which is great news. Overall I am extremely pleased with where things stand today. I think he is headed in the right direction. 06/11/2021 upon evaluation today patient appears to be doing well with regard to his left ankle ulcer and poorly in regard to the toe ulcer on the second toe right foot. This appears to show signs of joint exposure. Apparently this has been present for 1 to 2 months although he kept [facility] ([id number]) forgetting to tell me about it. That is unfortunate as right now it definitely appears to be doing significantly worse than what I would like to see. There does not appear to be any signs of active infection systemically though locally I am concerned about the possibility of infection the toe is quite red. Again no one from the facility ever contacted [place] to advise that this was going on in the  interim either. 06/17/2021 upon evaluation today patient presents for follow-up I did review his x-ray which showed a navicular bone fracture I am unsure of the chronicity of this. Subsequently he also had osteomyelitis of the toe which was what I was more concerned about this did not show up on x-ray but did show up on the pathology scrapings. This was listed as acute osteomyelitis. Nonetheless at this point I think that the antibiotic treatment is the best regimen to go with currently. The patient is in agreement with that plan. Nonetheless he has initially 30 days of doxycycline off likely extend that towards the end of the treatment cycle that will be around the middle of December for an additional 2  weeks. That all depends on how well he continues to heal. Nonetheless based on what I am seeing in the foot I did want a proceed with an MRI as well which I think will be helpful to identify if there is anything else that needs to be addressed from the standpoint of infection. 06/24/2021 upon evaluation today patient appears to be doing pretty well in regards to his wounds. I think both are actually showing signs of improvement which is good I did review his MRI today which did show signs of osteomyelitis of the middle and proximal phalanx on his right foot of the affected toe. With that being said this is actually showing signs of significant improvement today already with the antibiotic therapy I think the redness is also improved. Overall I think that we just need to give this some time with appropriate wound care we will see how things go potentially hyperbarics could be considered. 07/02/2021 upon inspection today patient actually appears to be doing well in regard to his left ankle which is getting very close to complete resolution of pleased in that regard. Unfortunately he is continuing to have issues with his second toe right foot and this seems to still be very painful for him. Recommend he  try something different from the standpoint of antibiotics. Electronic Signature(s) Signed: 07/02/2021 11:59:30 AM By: Worthy Keeler PA-C Entered By: Worthy Keeler on 07/02/2021 11:59:29 Chad [name] ([id number]) -------------------------------------------------------------------------------- Physical Exam Details Patient Name: Chad [name] Date of Service: 07/02/2021 9:45 AM Medical Record Number: [id number] Patient Account Number: [id number] Date of Birth/Sex: Dec 15, 1960 (60 y.o. M) Treating RN: Cornell Barman Primary Care Provider: SYSTEM, PCP Other Clinician: Referring Provider: Clayborn Bigness Treating Provider/Extender: Skipper Cliche in Treatment: 90 Constitutional Well-nourished and well-hydrated in no acute distress. Respiratory normal breathing without difficulty. Psychiatric this patient is able to make decisions and demonstrates good insight into disease process. Alert and Oriented x 3. pleasant and cooperative. Notes Currently my recommendation is good to be that we go ahead and get the patient started on Bactrim DS. I think this is probably can to be the best option at this time we will discontinue the doxycycline he is not allergic to the Bactrim and there is no any interactions. This is in regard to the second toe right foot. I also think having him see Dr. Amalia Hailey is good to be ideal here. Electronic Signature(s) Signed: 07/02/2021 12:01:18 PM By: Worthy Keeler PA-C Entered By: Worthy Keeler on 07/02/2021 12:01:18 Chad [name] ([id number]) -------------------------------------------------------------------------------- Physician Orders Details Patient Name: Chad [name] Date of Service: 07/02/2021 9:45 AM Medical Record Number: [id number] Patient Account Number: [id number] Date of Birth/Sex: 1960/12/18 (60 y.o. 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 Bathing/ Shower/ Hygiene o May shower; gently cleanse wound with antibacterial soap, rinse and pat dry prior to dressing wounds Edema Control - Lymphedema / Segmental Compressive Device / Other o Elevate legs to the level of the heart and pump ankles as often as possible o Elevate leg(s) parallel to the floor when sitting. Non-Wound Condition o Additional non-wound orders/instructions: -  patient needs toenails clipped asap Wound Treatment Wound #8 - Ankle Wound Laterality: Left, Medial Cleanser: Normal Saline 3 x Per Week/30 Days Discharge Instructions: Wash your hands with soap and water. Remove old dressing, discard into plastic bag and place into trash. Cleanse the wound with Normal Saline prior to applying a clean dressing using gauze sponges, not tissues or cotton balls. Do not scrub or use excessive force. Pat dry using gauze sponges, not tissue or cotton balls. Peri-Wound Care: Desitin Maximum Strength Ointment 4 (oz) 3 x Per Week/30 Days Discharge Instructions: Apply zinc periwound Primary Dressing: Hydrofera Blue Ready Transfer Foam, 2.5x2.5 (in/in) 3 x Per Week/30 Days Discharge Instructions: Apply Hydrofera Blue Ready to wound bed as directed (EXTRA SENT WITH PATIENT, PLEASE USE) Secondary Dressing: Kerlix 4.5 x 4.1 (in/yd) 3 x Per Week/30 Days Discharge Instructions: Apply kerlix over ABD pad Secured With: 7M Medipore H Soft Cloth Surgical Tape, 2x2 (in/yd) 3 x Per Week/30 Days Discharge Instructions: Secure kerlix Wound #9 - Toe Second Wound  Laterality: Right Cleanser: Normal Saline 3 x Per Week/30 Days Discharge Instructions: Wash your hands with soap and water. Remove old dressing, discard into plastic bag and place into trash. Cleanse the wound with Normal Saline prior to applying a clean dressing using gauze sponges, not tissues or cotton balls. Do not scrub or use excessive force. Pat dry using gauze sponges, not tissue or cotton balls. Peri-Wound Care: Desitin Maximum Strength Ointment 4 (oz) 3 x Per Week/30 Days Discharge Instructions: Apply zinc periwound Primary Dressing: Hydrofera Blue Ready Transfer Foam, 2.5x2.5 (in/in) 3 x Per Week/30 Days Discharge Instructions: Apply Hydrofera Blue Ready to wound bed as directed (EXTRA SENT WITH PATIENT, PLEASE USE) Secondary Dressing: Coverlet Latex-Free Fabric Adhesive Dressings 3 x Per Week/30 Days Discharge Instructions: 1.5 x 2 Chad [name], Chad [name] ([id number]) Secured With: 7M Medipore H Soft Cloth Surgical Tape, 2x2 (in/yd) 3 x Per Week/30 Days Discharge Instructions: Secure kerlix Consults o Podiatry - MD Amalia Hailey [facility] o Bacteria identified in Wound by Culture (MICRO) - right second toe oooo LOINC Code: 2035-5 oooo Convenience Name: Wound culture routine Patient Medications Allergies: penicillin Notifications Medication Indication Start End Bactrim DS 07/02/2021 DOSE 1 - oral 800 mg-160 mg tablet - 1 tablet oral taken 2 times per day for 30 days Electronic Signature(s) Signed: 07/02/2021 12:02:47 PM By: Worthy Keeler PA-C Entered By: Worthy Keeler on 07/02/2021 12:02:46 Chad [name] ([id number]) -------------------------------------------------------------------------------- Prescription 07/02/2021 Patient Name: Chad [name] Provider: Jeri Cos PA-C Date of Birth: 1960-11-25 NPI#: [id number] Sex: M DEA#: [id number] Phone #: [phone] License #: Patient Address: [place], [place] [address] [address] [place], [place] [address] [phone] Allergies penicillin Medication Medication: Route: Strength: Form: Bactrim DS oral 800 mg-160 mg tablet Class: ABSORBABLE SULFONAMIDE ANTIBACTERIAL AGENTS Dose: Frequency / Time: Indication: 1 1 tablet oral taken 2 times per day for 30 days Number of Refills: Number of Units: 0 Sixty (60) Tablet(s) Generic Substitution: Start Date: End Date: Administered at Facility: Substitution Permitted 69/01/9479 No Note to Pharmacy: Hand Signature: Date(s): Electronic Signature(s) Signed: 07/02/2021 12:06:21 PM By: Worthy Keeler PA-C Entered By: Worthy Keeler on 07/02/2021 12:02:47 Chad [name] ([id number]) --------------------------------------------------------------------------------  Problem List Details Patient Name: Chad [name] Date of Service: 07/02/2021 9:45 AM Medical Record Number: [id number] Patient Account Number: [id number] Date of Birth/Sex: Jun 25, 1961 (60 y.o. 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 08/14/2020 No Yes non-dominant side Inactive Problems ICD-10 Code Description Active Date Inactive Date L97.412 Non-pressure chronic ulcer of right heel and midfoot with fat layer exposed 08/14/2020 08/14/2020 L97.512 Non-pressure chronic ulcer of other part of right foot with fat layer exposed 08/14/2020 08/14/2020 Resolved Problems Electronic  Signature(s) Signed: 07/02/2021 10:20:20 AM By: Worthy Keeler PA-C Entered By: Worthy Keeler on 07/02/2021 10:20:20 Chad [name] ([id number]) -------------------------------------------------------------------------------- Progress Note Details Patient Name: Chad [name] Date of Service: 07/02/2021 9:45 AM Medical Record Number: [id number] Patient Account Number: [id number] Date of Birth/Sex: [birth year] (60 y.o. M) Treating RN: Cornell Barman Primary Care Provider: SYSTEM, PCP Other Clinician: Referring Provider: Clayborn Bigness Treating Provider/Extender: Skipper Cliche in Treatment: 46 Subjective Chief Complaint Information obtained from Patient Left ankle and right foot ulcers History of Present Illness (HPI) 10/08/18 on evaluation today patient actually presents to our office for initial evaluation concerning wounds that he has of the bilateral lower extremities. 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 He also has bilateral venous stasis. He apparently has been homeless currently following discharge from the hospital apparently he has been placed at [facility] which is is a skilled nursing facility locally. Nonetheless fortunately he does not show any signs of infection at this time which is good news. In fact several of the wound actually appears to be showing some signs of improvement already in my pinion. There are a couple areas in the left leg in particular there likely gonna require some sharp debridement to help clear away some necrotic tissue and help with more sufficient healing. No fevers, chills, nausea, or vomiting noted at this time. 10/15/18 on evaluation today patient actually appears to be doing very well in regard to his bilateral lower extremities. He's been tolerating the dressing changes without  complication. Fortunately there does not appear to be any evidence of active infection at this time which is great news. Overall I'm actually very pleased with how this has progressed in just one visits time. Readmission: 08/14/2020 upon evaluation today patient presents for re-evaluation here in our clinic. He is having issues with his left ankle region as well as his right toe and his right heel. He tells me that the toe and heel actually began as a area that was itching that he was scratching and then subsequently opened up into wounds. These may have been abscess areas I presume based on what I am seeing currently. With regard to his left ankle region he tells me this was a similar type occurrence although he does have venous stasis this very well may be more of a venous leg ulcer more than anything. Nonetheless I do believe that the patient would benefit from appropriate and aggressive wound care to try to help get things under better control here. He does have history of a stroke on the left side affecting him to some degree there that he is able to stand although he does have some residual weakness. Otherwise again the patient  does have chronic venous insufficiency as previously noted. His arterial studies most recently obtained showed that he had an ABI on the right of 1.16 with a TBI of 0.52 and on the left and ABI of 1.14 with a TBI of 0.81. That was obtained on 06/19/2020. 08/28/2020 upon evaluation today patient appears to be doing decently well in regard to his wounds in general. He has been tolerating the dressing changes without complication. Fortunately there does not appear to be any signs of active infection which is great news. With that being said I think the [facility] is doing a good job I would recommend that we likely continue with that currently. 09/11/2020 upon evaluation today patient's wounds did not appear to be doing too poorly but again he is not really showing signs of  significant improvement with regard to any of the wounds on the right. None of them have Hydrofera Blue on them I am not exactly sure why this is not being followed as the facility did not contact us to let us know of any issues with obtaining dressings or otherwise. With that being said he is supposed to be using Hydrofera Blue on both of the wounds on the right foot as well as the ankle wound on the left side. 09/18/2020 upon evaluation today patient appears to be doing poorly with regard to his wounds. Again right now the left ankle in particular showed signs of extreme maceration. Apparently he was told by someone with staff at [facility] they could not get the [facility]. With that being said this is something that is never been relayed to [place] one way or another. Also the patient subsequently has not supposed to have a border gauze dressing on. He should have an ABD pad and roll gauze to secure as this drains much too much just to have a border gauze dressing to cover. Nonetheless the fact that they are not using the appropriate dressing is directly causing deterioration of the left ankle wound it is significantly worse today compared to what it was previous. I did attempt to call [facility] while the patient was here I called three times and got no one to even pick up the phone. After this I had my for an office coordinator call and she was able to finally get through and leave a message with the D ON as of dictation of this note which is roughly about an hour and a half later I still have not been able to speak with anyone at the facility. 09/25/2020 upon evaluation today patient actually showing signs of good improvement which is excellent news. He has been tolerating the dressing changes without complication. Fortunately there is no signs of active infection which is great news. No fevers, chills, nausea, vomiting, or diarrhea. I do feel like the facility has been doing a  much better job at taking care of him as far as the dressings are concerned. However the director of nursing never did call me back. 10/09/2020 upon evaluation today patient appears to be doing well with regard to his wound. The toe ulcer did require some debridement but the other 2 areas actually appear to be doing quite well. 10/19/2020 upon evaluation today patient actually appears to be doing very well in regard to his wounds. In fact the heel does appear to be completely healed. The toe is doing better in the medial ankle on the left is also doing better. Overall I think he is headed in the  right direction. 10/26/2020 upon evaluation today patient appears to be doing well with regard to his wound. He is showing signs of improvement which is great news and overall I am very pleased with where things stand today. No fevers, chills, nausea, vomiting, or diarrhea. 11/02/2020 upon evaluation today patient appears to be doing well with regard to his wounds. He has been tolerating the dressing changes without complication overall I am extremely pleased with where things stand today. He in regard to the toe is almost completely healed and the medial [facility] ([id number]) ankle on the left is doing much better. 11/09/2020 upon evaluation today patient appears to be doing a little poorly in regard to his left medial ankle ulcer. Fortunately there does not appear to be any signs of systemic infection but unfortunately locally he does appear to be infected in fact he has blue-green drainage consistent with Pseudomonas. 11/16/2020 upon evaluation today patient appears to be doing well with regard to his wound. It actually appears to be doing better. I did place him on gentamicin cream since the Cipro was actually resistant even though he was positive for Pseudomonas on culture. Overall I think that he does seem to be doing better though I am unsure whether or not they have actually been putting the cream on.  The patient is not sure that we did talk to the nurse directly and she was going to initiate that treatment. Fortunately there does not appear to be any signs of active infection at this time. No fevers, chills, nausea, vomiting, or diarrhea. 4/28; the area on the right second toe is close to healed. Left medial ankle required debridement 12/07/2020 upon evaluation today patient appears to be doing well with regard to his wounds. In fact the right second toe appears to be completely healed which is great news. Fortunately there does not appear to be any signs of active infection at this time which is also great news. I think we can probably discontinue the gentamicin on top of everything else. 12/14/2020 upon evaluation today patient appears to be doing well with regard to his wound. He is making good progress and overall very pleased with where things stand today. There is no signs of active infection at this time which is great news. 12/28/2020 upon evaluation today patient appears to be doing well with regard to his wounds. He has been tolerating the dressing changes without complication. Fortunately there is no signs of active infection at this time. No fevers, chills, nausea, vomiting, or diarrhea. 12/28/2020 upon evaluation today patient's wound bed actually showed signs of excellent improvement. He has great epithelization and granulation I do not see any signs of infection overall I am extremely pleased with where things stand at this point. No fevers, chills, nausea, vomiting, or diarrhea. 01/11/2021 upon evaluation today patient appears to be doing well with regard to his wound on his leg. He has been tolerating the dressing changes without complication. Fortunately there does not appear to be any signs of active infection which is great news. No fevers, chills, nausea, vomiting, or diarrhea. 01/25/2021 upon evaluation today patient appears to be doing well with regard to his wound. He has been  tolerating the dressing changes without complication. Fortunately the collagen seems to be doing a great job which is excellent news. No fevers, chills, nausea, vomiting, or diarrhea. 02/08/2021 upon evaluation today patient's wound is actually looking a little bit worse especially in the periwound compared to previous. Fortunately there does not appear to  be any signs of infection which is great news with that being said he does have some irritation around the periphery of the wound which has me more concerned. He actually had a dressing on that had not been changed in 3 days. He also is supposed to have daily dressing changes. With regard to the dressing applied he had a silver alginate dressing and silver collagen is what is recommended and ordered. He also had no Desitin around the edges of the wound in the periwound region although that is on the order inspect to be done as well. In general I was very concerned I did contact [facility] actually spoke with Magda Paganini who is the scheduling individual and subsequently she stated that she would pass the information to the D ON apparently the D ON was not available to talk to me when I call today. 02/18/2021 upon evaluation today patient's wound is actually showing signs of improvement. Fortunately there does not appear to be any evidence of infection which is great news overall I am extremely pleased with where things stand today. No fevers, chills, nausea, vomiting, or diarrhea. 8/3; patient presents for 1 week follow-up. He has no issues or complaints today. He denies signs of infection. 03/11/2021 upon evaluation today patient appears to be doing well with regard to his wound. He does have a little bit of slough noted on the surface of the wound but fortunately there does not appear to be any signs of active infection at this time. No fevers, chills, nausea, vomiting, or diarrhea. 03/18/2021 upon evaluation today patient appears to be doing well  with regard to his wound. He has been tolerating the dressing changes without complication. There was a little irritation more proximal to where the wound was that was not noted last week but nonetheless this is very superficial just seems to be more irritation we just need to make sure to put a good amount of the zinc over the area in my opinion. Otherwise he does not seem to be doing significantly worse at all which is great news. 03/25/2021 upon evaluation today patient appears to be doing well with regard to his wound. He is going require some sharp debridement today to clear with some of the necrotic debris. I did perform this today without complication postdebridement wound bed appears to be doing much better this is great news. 04/08/2021 upon evaluation today patient appears to be doing decently well in regard to his wound although the overall measurement is not significantly smaller compared to previous. It is gone down a little bit but still the facility continues to not really put the appropriate dressings in place in fact he was supposed to have collagen we think he probably had more of an allergy to At this point. Fortunately there does not appear to be any signs of active infection systemically though locally I do not see anything on initial visualization either as far as erythema or warmth. 04/15/2021 upon evaluation today patient appears to be doing well with regard to his wound. He is actually showing signs of improvement. I did place him on antibiotics last week, Cipro. He has been taking that 2 times a day and seems to be tolerating it very well. I do not see any evidence of worsening and in fact the overall appearance of the wound is smaller today which is also great news. 9/26; left medial ankle chronic venous insufficiency wound is improved. Using Hydrofera Blue 10/10; left medial ankle chronic venous insufficiency. Wound  has not changed much in appearance completely nonviable  surface. Apparently there have been problems getting the right product on the wound at the facility although he came in with [facility] on today 05/14/2021 upon evaluation today patient appears to be doing well with regard to his wound. I think he is making progress here which is good news. Fortunately there does not appear to be any signs of active infection at this time. No fevers, chills, nausea, vomiting, or diarrhea. 05/20/2021 upon evaluation today patient appears to be doing well with regard to his wound. He is showing signs of good improvement which is great news. There does not appear to be any evidence of active infection which is also excellent news. No fevers, chills, nausea, vomiting, or diarrhea. Chad [name], Chad [name] ([id number]) 05/28/2021 upon evaluation today patient appears to be doing quite well. There does not appear to be any signs of active infection at this time which is great news. Overall I am extremely pleased with where things stand today. I think he is headed in the right direction. 06/11/2021 upon evaluation today patient appears to be doing well with regard to his left ankle ulcer and poorly in regard to the toe ulcer on the second toe right foot. This appears to show signs of joint exposure. Apparently this has been present for 1 to 2 months although he kept forgetting to tell me about it. That is unfortunate as right now it definitely appears to be doing significantly worse than what I would like to see. There does not appear to be any signs of active infection systemically though locally I am concerned about the possibility of infection the toe is quite red. Again no one from the facility ever contacted [place] to advise that this was going on in the interim either. 06/17/2021 upon evaluation today patient presents for follow-up I did review his x-ray which showed a navicular bone fracture I am unsure of the chronicity of this. Subsequently he also had osteomyelitis of the  toe which was what I was more concerned about this did not show up on x-ray but did show up on the pathology scrapings. This was listed as acute osteomyelitis. Nonetheless at this point I think that the antibiotic treatment is the best regimen to go with currently. The patient is in agreement with that plan. Nonetheless he has initially 30 days of doxycycline off likely extend that towards the end of the treatment cycle that will be around the middle of December for an additional 2 weeks. That all depends on how well he continues to heal. Nonetheless based on what I am seeing in the foot I did want a proceed with an MRI as well which I think will be helpful to identify if there is anything else that needs to be addressed from the standpoint of infection. 06/24/2021 upon evaluation today patient appears to be doing pretty well in regards to his wounds. I think both are actually showing signs of improvement which is good I did review his MRI today which did show signs of osteomyelitis of the middle and proximal phalanx on his right foot of the affected toe. With that being said this is actually showing signs of significant improvement today already with the antibiotic therapy I think the redness is also improved. Overall I think that we just need to give this some time with appropriate wound care we will see how things go potentially hyperbarics could be considered. 07/02/2021 upon inspection today patient actually appears to be doing  well in regard to his left ankle which is getting very close to complete resolution of pleased in that regard. Unfortunately he is continuing to have issues with his second toe right foot and this seems to still be very painful for him. Recommend he try something different from the standpoint of antibiotics. Objective Constitutional Well-nourished and well-hydrated in no acute distress. Vitals Time Taken: 10:04 AM, Height: 67 in, Weight: 160 lbs, BMI: 25.1, Temperature:  98.5 F, Pulse: 94 bpm, Respiratory Rate: 16 breaths/min, Blood Pressure: 106/72 mmHg. Respiratory normal breathing without difficulty. Psychiatric this patient is able to make decisions and demonstrates good insight into disease process. Alert and Oriented x 3. pleasant and cooperative. General Notes: Currently my recommendation is good to be that we go ahead and get the patient started on Bactrim DS. I think this is probably can to be the best option at this time we will discontinue the doxycycline he is not allergic to the Bactrim and there is no any interactions. This is in regard to the second toe right foot. I also think having him see Dr. Amalia Hailey is good to be ideal here. Integumentary (Hair, Skin) Wound #8 status is Open. Original cause of wound was Gradually Appeared. The date acquired was: 07/12/2019. The wound has been in treatment 46 weeks. The wound is located on the Left,Medial Ankle. The wound measures 1cm length x 0.6cm width x 0.1cm depth; 0.471cm^2 area and 0.047cm^3 volume. There is Fat Layer (Subcutaneous Tissue) exposed. There is a medium amount of serosanguineous drainage noted. The wound margin is flat and intact. There is large (67-100%) red granulation within the wound bed. There is a small (1-33%) amount of necrotic tissue within the wound bed including Eschar and Adherent Slough. Wound #9 status is Open. Original cause of wound was Gradually Appeared. The date acquired was: 05/31/2021. The wound has been in treatment 3 weeks. The wound is located on the Right Toe Second. The wound measures 0.3cm length x 0.7cm width x 0.2cm depth; 0.165cm^2 area and 0.033cm^3 volume. There is bone, joint, tendon, and Fat Layer (Subcutaneous Tissue) exposed. There is no tunneling or undermining noted. There is a medium amount of purulent drainage noted. The wound margin is flat and intact. There is small (1-33%) pink, hyper - granulation within the wound bed. There is a large (67-100%)  amount of necrotic tissue within the wound bed including Adherent Slough. General Notes: Right second toe red and swollen. Purulent drainage prior to cleaning. 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 There was a Excisional Skin/Subcutaneous Tissue Debridement with a total area of 0.6 sq cm performed by Tommie Sams., PA-C. With the following instrument(s): Curette to remove Viable and Non-Viable tissue/material. Material removed includes Callus, Subcutaneous Tissue, Slough, and Biofilm after achieving pain control using Lidocaine. A time out was conducted at 10:28, prior to the start of the procedure. A Minimum amount of bleeding was controlled with Pressure. The procedure was tolerated well. Post Debridement Measurements: 1cm length x 0.6cm width x 0.2cm depth; 0.094cm^3 volume. Character of Wound/Ulcer Post Debridement is stable. Severity of Tissue Post Debridement is: Fat layer exposed. Post procedure Diagnosis Wound #8: Same as  Pre-Procedure Plan Follow-up Appointments: Return Appointment in 1 week. Bathing/ Shower/ Hygiene: May shower; gently cleanse wound with antibacterial soap, rinse and pat dry prior to dressing wounds Edema Control - Lymphedema / Segmental Compressive Device / Other: Elevate legs to the level of the heart and pump ankles as often as possible Elevate leg(s) parallel to the floor when sitting. Non-Wound Condition: Additional non-wound orders/instructions: - patient needs toenails clipped asap Laboratory ordered were: Wound culture routine  - right second toe Consults ordered were: Podiatry - MD Amalia Hailey [facility] The following medication(s) was prescribed: Bactrim DS oral 800 mg-160 mg tablet 1 1 tablet oral taken 2 times per day for 30 days starting 07/02/2021 WOUND #8: - Ankle Wound Laterality: Left, Medial Cleanser: Normal Saline 3 x Per Week/30 Days Discharge Instructions: Wash your hands with soap and water. Remove old dressing, discard into plastic bag and place into trash. Cleanse the wound with Normal Saline prior to applying a clean dressing using gauze sponges, not tissues or cotton balls. Do not scrub or use excessive force. Pat dry using gauze sponges, not tissue or cotton balls. Peri-Wound Care: Desitin Maximum Strength Ointment 4 (oz) 3 x Per Week/30 Days Discharge Instructions: Apply zinc periwound Primary Dressing: Hydrofera Blue Ready Transfer Foam, 2.5x2.5 (in/in) 3 x Per Week/30 Days Discharge Instructions: Apply Hydrofera Blue Ready to wound bed as directed (EXTRA SENT WITH PATIENT, PLEASE USE) Secondary Dressing: Kerlix 4.5 x 4.1 (in/yd) 3 x Per Week/30 Days Discharge Instructions: Apply kerlix over ABD pad Secured With: 77M Medipore H Soft Cloth Surgical Tape, 2x2 (in/yd) 3 x Per Week/30 Days Discharge Instructions: Secure kerlix WOUND #9: - Toe Second Wound Laterality: Right Cleanser: Normal Saline 3 x Per Week/30 Days Discharge Instructions: Wash your hands with soap and water. Remove old dressing, discard into plastic bag and place into trash. Cleanse the wound with Normal Saline prior to applying a clean dressing using gauze sponges, not tissues or cotton balls. Do not scrub or use excessive force. Pat dry using gauze sponges, not tissue or cotton balls. Peri-Wound Care: Desitin Maximum Strength Ointment 4 (oz) 3 x Per Week/30 Days Discharge Instructions: Apply zinc periwound Primary Dressing: Hydrofera Blue Ready Transfer Foam, 2.5x2.5 (in/in) 3 x Per Week/30 Days Discharge Instructions: Apply  Hydrofera Blue Ready to wound bed as directed (EXTRA SENT WITH PATIENT, PLEASE USE) Secondary Dressing: Coverlet Latex-Free Fabric Adhesive Dressings 3 x Per Week/30 Days Discharge Instructions: 1.5 x 2 Secured With: 77M Medipore H Soft Cloth Surgical Tape, 2x2 (in/yd) 3 x Per Week/30 Days Discharge Instructions: Secure kerlix 1. I am going to go ahead and get the patient started on Bactrim DS I think that is probably can be the best way to go and I will get this sent into the pharmacy for him. DERMOT, Chad [name] ([id number]) 2. I am also going to recommend at this time that we have the patient go ahead and continue with the [facility] which I think is good to be the best way to go currently as well. 3. I am also going to recommend that we continue with the coverlet for the toe and for the ankle ABD and roll gauze to secure in place. 4. With regard to the osteomyelitis I am concerned about the fact that if we do not get this under control we could end up losing the toe I did would like to refer him to Dr. Merril Abbe who is a local podiatrist to see what he recommends. We will see patient  back for reevaluation in 1 week here in the clinic. If anything worsens or changes patient will contact our office for additional recommendations. Electronic Signature(s) Signed: 07/02/2021 12:05:03 PM By: Worthy Keeler PA-C Entered By: Worthy Keeler on 07/02/2021 12:05:03 Chad [name] ([id number]) -------------------------------------------------------------------------------- SuperBill Details Patient Name: Chad [name] Date of Service: 07/02/2021 Medical Record Number: [id number] Patient Account Number: [id number] Date of Birth/Sex: 1960/12/05 (60 y.o. 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 Facility Procedures CPT4 Code: 54627035 Description: 00938 - DEB SUBQ TISSUE 20 SQ CM/< Modifier: Quantity: 1 CPT4 Code: Description: ICD-10 Diagnosis Description 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, stage 4 Modifier: Quantity: Physician Procedures CPT4 Code: 1829937 Description: 16967 - WC PHYS LEVEL 4 - EST PT Modifier: 25 Quantity: 1 CPT4 Code: Description: ICD-10 Diagnosis Description I87.2 Venous insufficiency (chronic) (peripheral) L97.322 Non-pressure chronic ulcer of left ankle with fat layer exposed 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, stage 4 I69.354 Hemiplegia and hemiparesis following  cerebral infarction affecting left non Modifier: -dominant side Quantity: CPT4 Code: 8938101 Description: 75102 - WC PHYS SUBQ TISS 20 SQ CM Modifier: Quantity: 1 CPT4 Code: Description: ICD-10 Diagnosis Description 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, stage 4 Modifier: Quantity: Electronic Signature(s) Signed: 07/02/2021 12:05:34 PM By: Worthy Keeler PA-C Entered By: Worthy Keeler on 07/02/2021 12:05:34

## 2021-07-03 ENCOUNTER — Inpatient Hospital Stay: Payer: Medicaid Other | Attending: Internal Medicine

## 2021-07-03 ENCOUNTER — Inpatient Hospital Stay (HOSPITAL_BASED_OUTPATIENT_CLINIC_OR_DEPARTMENT_OTHER): Payer: Medicaid Other | Admitting: Internal Medicine

## 2021-07-03 ENCOUNTER — Encounter: Payer: Self-pay | Admitting: Internal Medicine

## 2021-07-03 ENCOUNTER — Inpatient Hospital Stay: Payer: Medicaid Other

## 2021-07-03 DIAGNOSIS — C661 Malignant neoplasm of right ureter: Secondary | ICD-10-CM

## 2021-07-03 DIAGNOSIS — Z1509 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: Secondary | ICD-10-CM

## 2021-07-03 DIAGNOSIS — C189 Malignant neoplasm of colon, unspecified: Secondary | ICD-10-CM | POA: Diagnosis not present

## 2021-07-03 DIAGNOSIS — Z95828 Presence of other vascular implants and grafts: Secondary | ICD-10-CM

## 2021-07-03 DIAGNOSIS — Z5112 Encounter for antineoplastic immunotherapy: Secondary | ICD-10-CM | POA: Insufficient documentation

## 2021-07-03 DIAGNOSIS — Z15068 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 of digestive system: Secondary | ICD-10-CM

## 2021-07-03 DIAGNOSIS — Z79899 Other long term (current) drug therapy: Secondary | ICD-10-CM | POA: Insufficient documentation

## 2021-07-03 DIAGNOSIS — C182 Malignant neoplasm of ascending colon: Secondary | ICD-10-CM

## 2021-07-03 DIAGNOSIS — Z7189 Other specified counseling: Secondary | ICD-10-CM

## 2021-07-03 DIAGNOSIS — Z515 Encounter for palliative care: Secondary | ICD-10-CM

## 2021-07-03 LAB — COMPREHENSIVE METABOLIC PANEL
ALT: 22 U/L (ref 0–44)
AST: 34 U/L (ref 15–41)
Albumin: 3.6 g/dL (ref 3.5–5.0)
Alkaline Phosphatase: 97 U/L (ref 38–126)
Anion gap: 10 (ref 5–15)
BUN: 21 mg/dL (ref 8–23)
CO2: 24 mmol/L (ref 22–32)
Calcium: 8.9 mg/dL (ref 8.9–10.3)
Chloride: 104 mmol/L (ref 98–111)
Creatinine, Ser: 1.03 mg/dL (ref 0.61–1.24)
GFR, Estimated: >60 mL/min (ref >60)
Glucose, Bld: 90 mg/dL (ref 70–99)
Potassium: 4.2 mmol/L (ref 3.5–5.1)
Sodium: 138 mmol/L (ref 135–145)
Total Bilirubin: 0.4 mg/dL (ref 0.3–1.2)
Total Protein: 7.5 g/dL (ref 6.5–8.1)

## 2021-07-03 LAB — CBC WITH DIFFERENTIAL/PLATELET
Abs Immature Granulocytes: 0.01 10*3/uL (ref 0.00–0.07)
Basophils Absolute: 0.1 10*3/uL (ref 0.0–0.1)
Basophils Relative: 1 %
Eosinophils Absolute: 0.3 10*3/uL (ref 0.0–0.5)
Eosinophils Relative: 5 %
HCT: 39.7 % (ref 39.0–52.0)
Hemoglobin: 12.4 g/dL — ABNORMAL LOW (ref 13.0–17.0)
Immature Granulocytes: 0 %
Lymphocytes Relative: 26 %
Lymphs Abs: 1.3 10*3/uL (ref 0.7–4.0)
MCH: 25.2 pg — ABNORMAL LOW (ref 26.0–34.0)
MCHC: 31.2 g/dL (ref 30.0–36.0)
MCV: 80.5 fL (ref 80.0–100.0)
Monocytes Absolute: 0.6 10*3/uL (ref 0.1–1.0)
Monocytes Relative: 13 %
Neutro Abs: 2.7 10*3/uL (ref 1.7–7.7)
Neutrophils Relative %: 55 %
Platelets: 129 10*3/uL — ABNORMAL LOW (ref 150–400)
RBC: 4.93 MIL/uL (ref 4.22–5.81)
RDW: 16.5 % — ABNORMAL HIGH (ref 11.5–15.5)
WBC: 4.9 10*3/uL (ref 4.0–10.5)
nRBC: 0 % (ref 0.0–0.2)

## 2021-07-03 MED ORDER — HEPARIN SOD (PORK) LOCK FLUSH 100 UNIT/ML IV SOLN
500.0000 [IU] | Freq: Once | INTRAVENOUS | Status: AC | PRN
  Administered 2021-07-03: 500 [IU]
  Filled 2021-07-03: qty 5

## 2021-07-03 MED ORDER — SODIUM CHLORIDE 0.9 % IV SOLN
200.0000 mg | Freq: Once | INTRAVENOUS | Status: AC
  Administered 2021-07-03: 200 mg via INTRAVENOUS
  Filled 2021-07-03: qty 8

## 2021-07-03 MED ORDER — SODIUM CHLORIDE 0.9 % IV SOLN
Freq: Once | INTRAVENOUS | Status: AC
  Filled 2021-07-03: qty 250

## 2021-07-03 MED ORDER — SODIUM CHLORIDE 0.9% FLUSH
10.0000 mL | Freq: Once | INTRAVENOUS | Status: AC
  Administered 2021-07-03: 10 mL via INTRAVENOUS
  Filled 2021-07-03: qty 10

## 2021-07-03 NOTE — Progress Notes (Signed)
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  Is alone/in a wheelchair.  Chad [name] 60 y.o.  male above history of stage IV-ureteral cancer/history of stage III colon cancer right side; recurrent sigmoid colon cancer [un-resected/under surveillance] and multiple other comorbidities currently on Keytruda is here for follow-up.  In the interim patient brother came from [place] for Thanksgiving.  Patient's colonoscopy was again not done as planned earlier in the month because of logistical reasons.   Denies any new symptoms of abdominal pain nausea vomiting.  No blood in urine.  Chronic shortness of breath.  Review  of Systems  Constitutional:  Positive for malaise/fatigue. Negative for chills, diaphoresis, fever and weight loss.  HENT:  Negative for nosebleeds and sore throat.   Eyes:  Negative for double vision.  Respiratory:  Positive for cough and shortness of breath. Negative for hemoptysis and wheezing.   Cardiovascular:  Negative for chest pain, palpitations and orthopnea.  Gastrointestinal:  Positive for constipation. Negative for abdominal pain, blood in stool, diarrhea, heartburn, melena, nausea and vomiting.  Genitourinary:  Negative for dysuria, frequency and urgency.  Musculoskeletal:  Positive for back pain and joint pain.  Skin: Negative.  Negative for itching and rash.  Neurological:  Positive for focal weakness. Negative for dizziness, tingling, weakness and headaches.       Chronic left-sided weakness upper than lower extremity.  Endo/Heme/Allergies:  Does not bruise/bleed easily.  Psychiatric/Behavioral:  Negative for depression. The patient is not nervous/anxious and does not have insomnia.     MEDICAL HISTORY:  Past Medical History:  Diagnosis Date   Anemia    Anxiety    ARF (acute respiratory failure) (HCC)    Bladder cancer (HCC)    COPD (chronic obstructive pulmonary disease) ([place])    Depression    Dysphagia    Family history of colon cancer    Family history of kidney cancer    Family history of leukemia    Family history of prostate cancer    GERD (gastroesophageal reflux disease)    Hepatitis    chronic hep c   Hydronephrosis    Hydronephrosis with ureteral stricture    Hyperlipidemia    Knee pain    Left   Malignant neoplasm of colon ([place])  Nerve pain    Peripheral vascular disease (HCC)    Prostate cancer (HCC)    Stroke [facility])    Ureteral cancer, right ([place])    Urinary frequency    Venous hypertension of both lower extremities     SURGICAL HISTORY: Past Surgical History:  Procedure Laterality Date   COLON SURGERY     En bloc extended right  hemicolectomy 07/2017   COLONOSCOPY WITH PROPOFOL N/A 11/06/2020   Procedure: COLONOSCOPY WITH PROPOFOL;  Surgeon: Jonathon Bellows, MD;  Location[facility] ENDOSCOPY;  Service: Gastroenterology;  Laterality: N/A;   CYSTOSCOPY W/ RETROGRADES Right 08/30/2018   Procedure: CYSTOSCOPY WITH RETROGRADE PYELOGRAM;  Surgeon: Hollice Espy, MD;  Location: [facility] ORS;  Service: Urology;  Laterality: Right;   CYSTOSCOPY WITH STENT PLACEMENT Right 04/25/2018   Procedure: CYSTOSCOPY WITH STENT PLACEMENT;  Surgeon: Hollice Espy, MD;  Location: [facility] ORS;  Service: Urology;  Laterality: Right;   CYSTOSCOPY WITH STENT PLACEMENT Right 08/30/2018   Procedure: [place];  Surgeon: Hollice Espy, MD;  Location: [facility] ORS;  Service: Urology;  Laterality: Right;   CYSTOSCOPY WITH STENT PLACEMENT Right 03/07/2019   Procedure: CYSTOSCOPY WITH STENT Exchange;  Surgeon: Hollice Espy, MD;  Location: [facility] ORS;  Service: Urology;  Laterality: Right;   CYSTOSCOPY WITH STENT PLACEMENT Right 11/21/2019   Procedure: CYSTOSCOPY WITH STENT Exchange;  Surgeon: Hollice Espy, MD;  Location: [facility] ORS;  Service: Urology;  Laterality: Right;   LOWER EXTREMITY ANGIOGRAPHY Left 05/23/2019   Procedure: LOWER EXTREMITY ANGIOGRAPHY;  Surgeon: Algernon Huxley, MD;  Location: [place] CV LAB;  Service: Cardiovascular;  Laterality: Left;   LOWER EXTREMITY ANGIOGRAPHY Right 05/30/2019   Procedure: LOWER EXTREMITY ANGIOGRAPHY;  Surgeon: Algernon Huxley, MD;  Location: [place] CV LAB;  Service: Cardiovascular;  Laterality: Right;   LOWER EXTREMITY ANGIOGRAPHY Right 02/13/2020   Procedure: LOWER EXTREMITY ANGIOGRAPHY;  Surgeon: Algernon Huxley, MD;  Location: [place] CV LAB;  Service: Cardiovascular;  Laterality: Right;   LOWER EXTREMITY ANGIOGRAPHY Left 02/20/2020   Procedure: LOWER EXTREMITY ANGIOGRAPHY;  Surgeon: Algernon Huxley, MD;  Location: [place] CV LAB;  Service: Cardiovascular;  Laterality: Left;   PORTA CATH  INSERTION N/A 02/28/2019   Procedure: PORTA CATH INSERTION;  Surgeon: Algernon Huxley, MD;  Location: [place] CV LAB;  Service: Cardiovascular;  Laterality: N/A;   tumor removed       SOCIAL HISTORY: Social History   Socioeconomic History   Marital status: Single    Spouse name: Not on file   Number of children: Not on file   Years of education: Not on file   Highest education level: Not on file  Occupational History   Not on file  Tobacco Use   Smoking status: Every Day    Packs/day: 1.00    Types: Cigarettes   Smokeless tobacco: Never  Vaping Use   Vaping Use: Never used  Substance and Sexual Activity   Alcohol use: Not Currently   Drug use: Not Currently   Sexual activity: Not Currently  Other Topics Concern   Not on file  Social History Narrative    used to live [place]; moved  To [place]- end of April 2019; in Nursing home; 1pp/day; quit alcohol. 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        Family- dad- prostate ca [at 2y]; brother- 13 died of prostate cancer; brother- 76- no cancers [[place]]; sonGerald Stabs [[place]];[id number] prostate ca [facility] [place]]; daughter- 86 [[place]]; another daughter 38 [  [place]/addict]. will refer genetics counseling. Given MSI- abnormal; highly suspicious of Lynch syndrome.  Patient's son [name] aware of high possible lynch syndrome.   Social Determinants of Health   Financial Resource Strain: Not on file  Food Insecurity: Not on file  Transportation Needs: Not on file  Physical Activity: Not on file  Stress: Not on file  Social Connections: Not on file  Intimate Partner Violence: Not on file    FAMILY HISTORY: Family History  Problem Relation Age of Onset   Prostate cancer Father 18   Cancer Brother 18       unsure type   Cancer Paternal Uncle        unsure type   Cancer Maternal Grandmother        unsure type   Cancer Paternal Grandmother        unsure type   Kidney cancer Paternal Grandfather    Cancer Other        unsure types   Leukemia Son     Cancer Son        other cancers, possibly colon    ALLERGIES:  is allergic to penicillins.  MEDICATIONS:  Current Outpatient Medications  Medication Sig Dispense Refill   acetaminophen (TYLENOL) 325 MG tablet Take 650 mg by mouth 3 (three) times daily.      aspirin EC 81 MG tablet Take 1 tablet (81 mg total) by mouth daily. 150 tablet 2   atorvastatin (LIPITOR) 10 MG tablet Take 1 tablet (10 mg total) by mouth daily. 30 tablet 11   bisacodyl (DULCOLAX) 5 MG EC tablet Take two tablets morning and two tablets afternoon day prior to Miralax prep.     carboxymethylcellulose 1 % ophthalmic solution Apply 1 drop to eye 2 (two) times daily.     clopidogrel (PLAVIX) 75 MG tablet Take 1 tablet (75 mg total) by mouth daily. 30 tablet 11   cyclobenzaprine (FLEXERIL) 10 MG tablet Take 10 mg by mouth at bedtime.     doxycycline (VIBRA-TABS) 100 MG tablet Take 100 mg by mouth 2 (two) times daily.     escitalopram (LEXAPRO) 5 MG tablet Take 5 mg by mouth daily.     gabapentin (NEURONTIN) 100 MG capsule Take 100 mg by mouth 3 (three) times daily.     mirtazapine (REMERON) 7.5 MG tablet Take 7.5 mg by mouth at bedtime.     Oxycodone HCl 10 MG TABS Take 10 mg by mouth every 6 (six) hours as needed for pain.     senna-docusate (SENOKOT-S) 8.6-50 MG tablet Take 1 tablet by mouth daily.     tamsulosin (FLOMAX) 0.4 MG CAPS capsule Take 0.4 mg by mouth daily.     Cholecalciferol (VITAMIN D) 50 MCG (2000 UT) CAPS Take by mouth. (Patient not taking: Reported on 07/03/2021)     Cholecalciferol 50 MCG (2000 UT) CAPS SMARTSIG:1 Tablet(s) By Mouth Daily (Patient not taking: Reported on 07/03/2021)     pantoprazole (PROTONIX) 40 MG tablet Take 40 mg by mouth daily. (Patient not taking: Reported on 07/03/2021)     polyethylene glycol powder (GLYCOLAX/MIRALAX) 17 GM/SCOOP powder One bottle for colonoscopy prep. Use as directed. (Patient not taking: Reported on 07/03/2021)     RESTORE CALCIUM ALGINATE EX Apply topically.  Note: Dressing located on left ankle. Facility cleaning wound "every day shift and applying wound cleanser and appication of calcium Alginate and cover with dry dressing and wrapped with Kerlix" (Patient not taking: Reported on 07/03/2021)     No current  facility-administered medications for this visit.   Facility-Administered Medications Ordered in Other Visits  Medication Dose Route Frequency Provider Last Rate Last Admin   heparin lock flush 100 unit/mL  500 Units Intracatheter Once PRN Cammie Sickle, MD       pembrolizumab [facility]) 200 mg in sodium chloride 0.9 % 50 mL chemo infusion  200 mg Intravenous Once Cammie Sickle, MD 116 mL/hr at 07/03/21 0929 200 mg at 07/03/21 0929      .  PHYSICAL EXAMINATION: ECOG PERFORMANCE STATUS: 1 - Symptomatic but completely ambulatory  Vitals:   07/03/21 0848  BP: 108/76  Pulse: 80  Resp: 16  Temp: 98.6 F (37 C)  SpO2: 98%   Filed Weights   07/03/21 0848  Weight: 160 lb (72.6 kg)    Physical Exam HENT:     Head: Normocephalic and atraumatic.     Mouth/Throat:     Pharynx: No oropharyngeal exudate.  Eyes:     Pupils: Pupils are equal, round, and reactive to light.  Cardiovascular:     Rate and Rhythm: Normal rate and regular rhythm.  Pulmonary:     Effort: No respiratory distress.     Breath sounds: No wheezing.  Abdominal:     General: Bowel sounds are normal. There is no distension.     Palpations: Abdomen is soft. There is no mass.     Tenderness: There is no abdominal tenderness. There is no guarding or rebound.  Musculoskeletal:        General: No tenderness. Normal range of motion.     Cervical back: Normal range of motion and neck supple.  Skin:    General: Skin is warm.     Comments: Bilateral lower extremity ulcerations noted.  Pulses intact bilaterally.  Cystlike lesion noted right elbow-nontender.  No erythema noted.  Neurological:     Mental Status: He is oriented to person, place, and time.      Comments: Sleepy but easily arousable.  Chronic weakness left upper and lower extremity.  Psychiatric:        Mood and Affect: Affect normal.     LABORATORY DATA:  I have reviewed the data as listed Lab Results  Component Value Date   WBC 4.9 07/03/2021   HGB 12.4 (L) 07/03/2021   HCT 39.7 07/03/2021   MCV 80.5 07/03/2021   PLT 129 (L) 07/03/2021   Recent Labs    05/22/21 0835 06/12/21 0855 07/03/21 0819  NA 134* 134* 138  K 4.0 3.7 4.2  CL 105 101 104  CO2 _0 GLUCOSE 82 154* 90  BUN _1 CREATININE 0.87 0.81 1.03  CALCIUM 8.4* 8.5* 8.9  GFRNONAA >60 >60 >60  PROT 7.6 7.8 7.5  ALBUMIN 3.8 3.7 3.6  AST 32 32 34  ALT _2 ALKPHOS 111 109 97  BILITOT 0.2* 0.2* 0.4    RADIOGRAPHIC STUDIES: I have personally reviewed the radiological images as listed and agreed with the findings in the report. MR FOOT RIGHT WO CONTRAST  Result Date: 06/23/2021 CLINICAL DATA:  Nonhealing wound on top of foot for 2 months. No known injury or prior relevant surgery. EXAM: MRI OF THE RIGHT FOREFOOT WITHOUT CONTRAST TECHNIQUE: Multiplanar, multisequence MR imaging of the right forefoot was performed. No intravenous contrast was administered. COMPARISON:  None. FINDINGS: Bones/Joint/Cartilage There is apparent skin ulceration dorsal to the 2nd proximal interphalangeal joint with suspected osseous exposure. There is abnormal T1 and T2 marrow signal within  the head of the 2nd proximal phalanx and the base of the middle phalanx, highly suspicious for osteomyelitis. No significant joint effusion. No other significant bone marrow signal abnormalities are identified. There are mild degenerative changes at the 1st metatarsophalangeal joint. Ligaments The Lisfranc ligament is intact. The collateral ligaments of the metatarsophalangeal joints appear intact. Muscles and Tendons Diffuse forefoot muscular T2 hyperintensity, without focal fluid collection. The forefoot tendons appear intact.  Soft tissues As above, apparent skin ulceration dorsal to the 2nd proximal interphalangeal joint with suspected osseous exposure. No focal fluid collection is seen in this area or elsewhere in the forefoot. There is nonspecific fluid in the 1st through 3rd intermetatarsal bursa. IMPRESSION: 1. Apparent skin ulceration dorsal to the 2nd proximal interphalangeal joint with suspected osseous erosion and marrow changes in the 2nd proximal and middle phalanges, highly suspicious for osteomyelitis. 2. No evidence of soft tissue abscess. Electronically Signed   By: Richardean Sale M.D.   On: 06/23/2021 10:06    ASSESSMENT & PLAN:   Ureteral cancer, right ([place]) # High-grade urothelial cancer/cytology; likely of the right renal pelvis /upper ureter. On keytruda- OCT 4th, 2022-  The irregular/ill-defined wall thickening seen previously in the right renal pelvis and proximal ureter appears decreased in the interval with less enhancing tissue visible on today's study, but this is a subtle change. Stable 10 mm short axis portal caval lymph node.STABLE.   # Proceed with  [place] today; Labs today reviewed; Labs today reviewed;  acceptable for treatment today. will repeat scan in Jan 2023/willorder at next visit.  # Incidental on CT OCT 10th, 2022-Interval development of a 13 mm lobular polypoid lesion right mainstem- mucus/secretion Vs true lesion- will repeat scan in Jan 2023/willorder at next visit.  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 Discussed with Dr.Byrnett/Dr.Anna  # Iron deficiency anemia-hemoglobin 11-12; February 2022 iron studies/ferritin-LOW; hold Venofer with infusions- STABLE.   # Bilateral LE ulcers-s/p wound care evaluation; s/p PTCA with Dr.Dew. bil Arterial Dopp-wnl-STABLE.   #DISPOSITION: #   [name];  # follow up in 3 weeks-;MD labs-cbc/cmp;TSH  Keytruda; ;Dr.B.   All questions were answered. The patient knows to call the clinic with any problems, questions or concerns.    Cammie Sickle, MD 07/03/2021 9:49 AM

## 2021-07-03 NOTE — Progress Notes (Signed)
Chad [name] ([id number]) Visit Report for 06/17/2021 Chief Complaint Document Details Patient Name: Chad [name] Date of Service: 06/17/2021 8:00 AM Medical Record Number: [id number] Patient Account Number: [id number] Date of Birth/Sex: [birth year] (60 y.o. M) Treating RN: Carlene Coria Primary Care Provider: SYSTEM, PCP Other Clinician: Referring Provider: Clayborn Bigness Treating Provider/Extender: Skipper Cliche in Treatment: 43 Information Obtained from: Patient Chief Complaint Left ankle and right foot ulcers Electronic Signature(s) Signed: 06/17/2021 5:26:28 PM By: Worthy Keeler PA-C Entered By: Worthy Keeler on 06/17/2021 08:22:48 Chad [name] ([id number]) -------------------------------------------------------------------------------- Debridement Details Patient Name: Chad [name] Date of Service: 06/17/2021 8:00 AM Medical Record Number: [id number] Patient Account Number: [id number] Date of Birth/Sex: 1960/12/17 (60 y.o. M) Treating RN: Carlene Coria Primary Care Provider: SYSTEM, PCP Other Clinician: Referring Provider: Clayborn Bigness Treating Provider/Extender: Skipper Cliche in Treatment: 43 Debridement Performed for Wound #8 Left,Medial Ankle Assessment: Performed By: Physician Tommie Sams., PA-C Debridement Type: Debridement Severity of Tissue Pre Debridement: Fat layer exposed Level of Consciousness (Pre- Awake and Alert procedure): Pre-procedure Verification/Time Out Yes - 08:38 Taken: Start Time: 08:38 Pain Control: Lidocaine 4% Topical Solution Total Area Debrided (L x W): 0.7 (cm) x 0.3 (cm) = 0.21 (cm) Tissue and other material Viable, Non-Viable, Slough, Subcutaneous, Skin: Dermis , Skin: Epidermis, Biofilm, Slough debrided: Level: Skin/Subcutaneous Tissue Debridement Description: Excisional Instrument: Curette Bleeding: Minimum Hemostasis Achieved: Pressure End Time: 08:40 Procedural Pain: 0 Post Procedural Pain: 0 Response to  Treatment: Procedure was tolerated well Level of Consciousness (Post- Awake and Alert procedure): Post Debridement Measurements of Total Wound Length: (cm) 0.7 Width: (cm) 0.3 Depth: (cm) 0.1 Volume: (cm) 0.016 Character of Wound/Ulcer Post Debridement: Improved Severity of Tissue Post Debridement: Fat layer exposed Post Procedure Diagnosis Same as Pre-procedure Electronic Signature(s) Signed: 06/17/2021 5:26:28 PM By: Worthy Keeler PA-C Signed: 07/03/2021 1:44:34 PM By: Carlene Coria RN Entered By: Carlene Coria on 06/17/2021 08:39:02 Chad [name] ([id number]) -------------------------------------------------------------------------------- Debridement Details Patient Name: Chad [name] Date of Service: 06/17/2021 8:00 AM Medical Record Number: [id number] Patient Account Number: [id number] Date of Birth/Sex: 10/19/1960 (60 y.o. M) Treating RN: Carlene Coria Primary Care Provider: SYSTEM, PCP Other Clinician: Referring Provider: Clayborn Bigness Treating Provider/Extender: Skipper Cliche in Treatment: 43 Debridement Performed for Wound #9 Right Toe Second Assessment: Performed By: Physician Tommie Sams., PA-C Debridement Type: Debridement Level of Consciousness (Pre- Awake and Alert procedure): Pre-procedure Verification/Time Out Yes - 08:38 Taken: Start Time: 08:38 Pain Control: Lidocaine 4% Topical Solution Total Area Debrided (L x W): 0.5 (cm) x 1 (cm) = 0.5 (cm) Tissue and other material Viable, Non-Viable, Slough, Subcutaneous, Skin: Dermis , Skin: Epidermis, Biofilm, Slough debrided: Level: Skin/Subcutaneous Tissue Debridement Description: Excisional Instrument: Curette Bleeding: Minimum Hemostasis Achieved: Pressure End Time: 08:40 Procedural Pain: 0 Post Procedural Pain: 0 Response to Treatment: Procedure was tolerated well Level of Consciousness (Post- Awake and Alert procedure): Post Debridement Measurements of Total Wound Length: (cm)  0.5 Stage: Category/Stage IV Width: (cm) 1 Depth: (cm) 0.2 Volume: (cm) 0.079 Character of Wound/Ulcer Post Debridement: Improved Post Procedure Diagnosis Same as Pre-procedure Electronic Signature(s) Signed: 06/17/2021 5:26:28 PM By: Worthy Keeler PA-C Signed: 07/03/2021 1:44:34 PM By: Carlene Coria RN Entered By: Carlene Coria on 06/17/2021 08:40:29 Chad [name] ([id number]) -------------------------------------------------------------------------------- HPI Details Patient Name: Chad [name] Date of Service: 06/17/2021 8:00 AM Medical Record Number: [id number] Patient Account Number: [id number] Date of Birth/Sex: [birth year] (60 y.o. M) Treating RN: Carlene Coria Primary Care Provider: SYSTEM, PCP Other Clinician: Referring Provider: Clayborn Bigness Treating Provider/Extender:  Jeri Cos Weeks in Treatment: 43 History of Present Illness HPI Description: 10/08/18 on evaluation today patient actually presents to our office for initial evaluation concerning wounds that he has of the bilateral lower extremities. 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 He also has bilateral venous stasis. He apparently has been homeless currently following discharge from the hospital apparently he has been placed at [facility] which is is a skilled nursing facility locally. Nonetheless fortunately he does not show any signs of infection at this time which is good news. In fact several of the wound actually appears to be showing some signs of improvement already in my pinion. There are a couple areas in the left leg in particular there likely gonna require some sharp debridement to help clear away some necrotic tissue and help with more sufficient healing. No fevers, chills, nausea, or vomiting noted at this time. 10/15/18 on evaluation  today patient actually appears to be doing very well in regard to his bilateral lower extremities. He's been tolerating the dressing changes without complication. Fortunately there does not appear to be any evidence of active infection at this time which is great news. Overall I'm actually very pleased with how this has progressed in just one visits time. Readmission: 08/14/2020 upon evaluation today patient presents for re-evaluation here in our clinic. He is having issues with his left ankle region as well as his right toe and his right heel. He tells me that the toe and heel actually began as a area that was itching that he was scratching and then subsequently opened up into wounds. These may have been abscess areas I presume based on what I am seeing currently. With regard to his left ankle region he tells me this was a similar type occurrence although he does have venous stasis this very well may be more of a venous leg ulcer more than anything. Nonetheless I do believe that the patient would benefit from appropriate and aggressive wound care to try to help get things under better control here. He does have history of a stroke on the left side affecting him to some degree there that he is able to stand although he does have some residual weakness. Otherwise again the patient does have chronic venous insufficiency as previously noted. His arterial studies most recently obtained showed that he had an ABI on the right of 1.16 with a TBI of 0.52 and on the left and ABI of 1.14 with a TBI of 0.81. That was obtained on 06/19/2020. 08/28/2020 upon evaluation today patient appears to be doing decently well in regard to his wounds in general. He has been tolerating the dressing changes without complication. Fortunately there does not appear to be any signs of active infection which is great news. With that being said I think the [facility] is doing a good job I would recommend that we likely continue  with that currently. 09/11/2020 upon evaluation today patient's wounds did not appear to be doing too poorly but again he is not really showing signs of significant improvement with regard to any of the wounds on the right. None of them have Hydrofera Blue on them I am not exactly sure why this is not being followed as the facility did not contact us to let us know of any issues with obtaining dressings or otherwise. With that being  said he is supposed to be using Hydrofera Blue on both of the wounds on the right foot as well as the ankle wound on the left side. 09/18/2020 upon evaluation today patient appears to be doing poorly with regard to his wounds. Again right now the left ankle in particular showed signs of extreme maceration. Apparently he was told by someone with staff at [facility] they could not get the [facility]. With that being said this is something that is never been relayed to [place] one way or another. Also the patient subsequently has not supposed to have a border gauze dressing on. He should have an ABD pad and roll gauze to secure as this drains much too much just to have a border gauze dressing to cover. Nonetheless the fact that they are not using the appropriate dressing is directly causing deterioration of the left ankle wound it is significantly worse today compared to what it was previous. I did attempt to call [facility] while the patient was here I called three times and got no one to even pick up the phone. After this I had my for an office coordinator call and she was able to finally get through and leave a message with the D ON as of dictation of this note which is roughly about an hour and a half later I still have not been able to speak with anyone at the facility. 09/25/2020 upon evaluation today patient actually showing signs of good improvement which is excellent news. He has been tolerating the dressing changes without complication. Fortunately  there is no signs of active infection which is great news. No fevers, chills, nausea, vomiting, or diarrhea. I do feel like the facility has been doing a much better job at taking care of him as far as the dressings are concerned. However the director of nursing never did call me back. 10/09/2020 upon evaluation today patient appears to be doing well with regard to his wound. The toe ulcer did require some debridement but the other 2 areas actually appear to be doing quite well. 10/19/2020 upon evaluation today patient actually appears to be doing very well in regard to his wounds. In fact the heel does appear to be completely healed. The toe is doing better in the medial ankle on the left is also doing better. Overall I think he is headed in the right direction. 10/26/2020 upon evaluation today patient appears to be doing well with regard to his wound. He is showing signs of improvement which is great news and overall I am very pleased with where things stand today. No fevers, chills, nausea, vomiting, or diarrhea. 11/02/2020 upon evaluation today patient appears to be doing well with regard to his wounds. He has been tolerating the dressing changes without complication overall I am extremely pleased with where things stand today. He in regard to the toe is almost completely healed and the medial ankle on the left is doing much better. 11/09/2020 upon evaluation today patient appears to be doing a little poorly in regard to his left medial ankle ulcer. Fortunately there does not appear to be any signs of systemic infection but unfortunately locally he does appear to be infected in fact he has blue-green drainage consistent with Pseudomonas. Chad [name], Chad [name] ([id number]) 11/16/2020 upon evaluation today patient appears to be doing well with regard to his wound. It actually appears to be doing better. I did place him on gentamicin cream since the Cipro was actually resistant even though he was  positive for  Pseudomonas on culture. Overall I think that he does seem to be doing better though I am unsure whether or not they have actually been putting the cream on. The patient is not sure that we did talk to the nurse directly and she was going to initiate that treatment. Fortunately there does not appear to be any signs of active infection at this time. No fevers, chills, nausea, vomiting, or diarrhea. 4/28; the area on the right second toe is close to healed. Left medial ankle required debridement 12/07/2020 upon evaluation today patient appears to be doing well with regard to his wounds. In fact the right second toe appears to be completely healed which is great news. Fortunately there does not appear to be any signs of active infection at this time which is also great news. I think we can probably discontinue the gentamicin on top of everything else. 12/14/2020 upon evaluation today patient appears to be doing well with regard to his wound. He is making good progress and overall very pleased with where things stand today. There is no signs of active infection at this time which is great news. 12/28/2020 upon evaluation today patient appears to be doing well with regard to his wounds. He has been tolerating the dressing changes without complication. Fortunately there is no signs of active infection at this time. No fevers, chills, nausea, vomiting, or diarrhea. 12/28/2020 upon evaluation today patient's wound bed actually showed signs of excellent improvement. He has great epithelization and granulation I do not see any signs of infection overall I am extremely pleased with where things stand at this point. No fevers, chills, nausea, vomiting, or diarrhea. 01/11/2021 upon evaluation today patient appears to be doing well with regard to his wound on his leg. He has been tolerating the dressing changes without complication. Fortunately there does not appear to be any signs of active infection which is great news.  No fevers, chills, nausea, vomiting, or diarrhea. 01/25/2021 upon evaluation today patient appears to be doing well with regard to his wound. He has been tolerating the dressing changes without complication. Fortunately the collagen seems to be doing a great job which is excellent news. No fevers, chills, nausea, vomiting, or diarrhea. 02/08/2021 upon evaluation today patient's wound is actually looking a little bit worse especially in the periwound compared to previous. Fortunately there does not appear to be any signs of infection which is great news with that being said he does have some irritation around the periphery of the wound which has me more concerned. He actually had a dressing on that had not been changed in 3 days. He also is supposed to have daily dressing changes. With regard to the dressing applied he had a silver alginate dressing and silver collagen is what is recommended and ordered. He also had no Desitin around the edges of the wound in the periwound region although that is on the order inspect to be done as well. In general I was very concerned I did contact [facility] actually spoke with Magda Paganini who is the scheduling individual and subsequently she stated that she would pass the information to the D ON apparently the D ON was not available to talk to me when I call today. 02/18/2021 upon evaluation today patient's wound is actually showing signs of improvement. Fortunately there does not appear to be any evidence of infection which is great news overall I am extremely pleased with where things stand today. No fevers, chills, nausea, vomiting, or  diarrhea. 8/3; patient presents for 1 week follow-up. He has no issues or complaints today. He denies signs of infection. 03/11/2021 upon evaluation today patient appears to be doing well with regard to his wound. He does have a little bit of slough noted on the surface of the wound but fortunately there does not appear to be any  signs of active infection at this time. No fevers, chills, nausea, vomiting, or diarrhea. 03/18/2021 upon evaluation today patient appears to be doing well with regard to his wound. He has been tolerating the dressing changes without complication. There was a little irritation more proximal to where the wound was that was not noted last week but nonetheless this is very superficial just seems to be more irritation we just need to make sure to put a good amount of the zinc over the area in my opinion. Otherwise he does not seem to be doing significantly worse at all which is great news. 03/25/2021 upon evaluation today patient appears to be doing well with regard to his wound. He is going require some sharp debridement today to clear with some of the necrotic debris. I did perform this today without complication postdebridement wound bed appears to be doing much better this is great news. 04/08/2021 upon evaluation today patient appears to be doing decently well in regard to his wound although the overall measurement is not significantly smaller compared to previous. It is gone down a little bit but still the facility continues to not really put the appropriate dressings in place in fact he was supposed to have collagen we think he probably had more of an allergy to At this point. Fortunately there does not appear to be any signs of active infection systemically though locally I do not see anything on initial visualization either as far as erythema or warmth. 04/15/2021 upon evaluation today patient appears to be doing well with regard to his wound. He is actually showing signs of improvement. I did place him on antibiotics last week, Cipro. He has been taking that 2 times a day and seems to be tolerating it very well. I do not see any evidence of worsening and in fact the overall appearance of the wound is smaller today which is also great news. 9/26; left medial ankle chronic venous insufficiency wound  is improved. Using Hydrofera Blue 10/10; left medial ankle chronic venous insufficiency. Wound has not changed much in appearance completely nonviable surface. Apparently there have been problems getting the right product on the wound at the facility although he came in with [facility] on today 05/14/2021 upon evaluation today patient appears to be doing well with regard to his wound. I think he is making progress here which is good news. Fortunately there does not appear to be any signs of active infection at this time. No fevers, chills, nausea, vomiting, or diarrhea. 05/20/2021 upon evaluation today patient appears to be doing well with regard to his wound. He is showing signs of good improvement which is great news. There does not appear to be any evidence of active infection which is also excellent news. No fevers, chills, nausea, vomiting, or diarrhea. 05/28/2021 upon evaluation today patient appears to be doing quite well. There does not appear to be any signs of active infection at this time which is great news. Overall I am extremely pleased with where things stand today. I think he is headed in the right direction. 06/11/2021 upon evaluation today patient appears to be doing well with regard to  his left ankle ulcer and poorly in regard to the toe ulcer on the second toe right foot. This appears to show signs of joint exposure. Apparently this has been present for 1 to 2 months although he kept [facility] ([id number]) forgetting to tell me about it. That is unfortunate as right now it definitely appears to be doing significantly worse than what I would like to see. There does not appear to be any signs of active infection systemically though locally I am concerned about the possibility of infection the toe is quite red. Again no one from the facility ever contacted [place] to advise that this was going on in the interim either. 06/17/2021 upon evaluation today patient presents for  follow-up I did review his x-ray which showed a navicular bone fracture I am unsure of the chronicity of this. Subsequently he also had osteomyelitis of the toe which was what I was more concerned about this did not show up on x-ray but did show up on the pathology scrapings. This was listed as acute osteomyelitis. Nonetheless at this point I think that the antibiotic treatment is the best regimen to go with currently. The patient is in agreement with that plan. Nonetheless he has initially 30 days of doxycycline off likely extend that towards the end of the treatment cycle that will be around the middle of December for an additional 2 weeks. That all depends on how well he continues to heal. Nonetheless based on what I am seeing in the foot I did want a proceed with an MRI as well which I think will be helpful to identify if there is anything else that needs to be addressed from the standpoint of infection. Electronic Signature(s) Signed: 06/17/2021 10:15:19 AM By: Worthy Keeler PA-C Entered By: Worthy Keeler on 06/17/2021 10:15:18 Chad [name] ([id number]) -------------------------------------------------------------------------------- Physical Exam Details Patient Name: Chad [name] Date of Service: 06/17/2021 8:00 AM Medical Record Number: [id number] Patient Account Number: [id number] Date of Birth/Sex: [birth year] (60 y.o. M) Treating RN: Carlene Coria Primary Care Provider: SYSTEM, PCP Other Clinician: Referring Provider: Clayborn Bigness Treating Provider/Extender: Skipper Cliche in Treatment: 16 Constitutional Well-nourished and well-hydrated in no acute distress. Respiratory normal breathing without difficulty. Psychiatric this patient is able to make decisions and demonstrates good insight into disease process. Alert and Oriented x 3. pleasant and cooperative. Notes Based on what I am seeing today I do believe that the patient's wounds on the toe actually appear to be doing  significantly better which is great news. I do not see any signs of active infection systemically at this point. With that being said I do think that there is definitive evidence locally that the patient is having an ongoing infection and that needs to be addressed. We do have him on oral medications currently, doxycycline, and that seems to be a little bit better this week. Electronic Signature(s) Signed: 06/17/2021 10:17:00 AM By: Worthy Keeler PA-C Entered By: Worthy Keeler on 06/17/2021 10:17:00 Chad [name] ([id number]) -------------------------------------------------------------------------------- Physician Orders Details Patient Name: Chad [name] Date of Service: 06/17/2021 8:00 AM Medical Record Number: [id number] Patient Account Number: [id number] Date of Birth/Sex: October 06, 1960 (60 y.o. 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 Bathing/ Shower/ Hygiene o May shower; gently cleanse wound with antibacterial soap, rinse and pat dry prior to dressing wounds Edema Control - Lymphedema / Segmental Compressive Device / Other o Elevate legs to the level of the heart and pump ankles as often as possible o Elevate leg(s) parallel to the floor when sitting. o DO YOUR BEST to sleep in the bed at night. DO NOT sleep in your recliner. Long hours of sitting in a recliner leads to swelling of the legs and/or potential wounds on your backside. Wound  Treatment Wound #8 - Ankle Wound Laterality: Left, Medial Cleanser: Normal Saline 3 x Per Week/30 Days Discharge Instructions: Wash your hands with soap and water. Remove old dressing, discard into plastic bag and place into trash. Cleanse the wound with Normal Saline prior to applying a clean dressing using gauze sponges, not tissues or cotton balls. Do not scrub or use excessive force. Pat dry using gauze sponges, not tissue or cotton balls. Peri-Wound Care: Desitin Maximum Strength Ointment 4 (oz) 3 x Per Week/30 Days Discharge Instructions: Apply zinc periwound Primary Dressing: Hydrofera Blue Ready Transfer Foam, 2.5x2.5 (in/in) 3 x Per Week/30 Days Discharge Instructions: Apply Hydrofera Blue Ready to wound bed as directed (EXTRA SENT WITH PATIENT, PLEASE USE) Secondary Dressing: Kerlix 4.5 x 4.1 (in/yd) 3 x Per Week/30 Days Discharge Instructions: Apply kerlix over ABD pad Secured With: 62M Medipore H Soft Cloth Surgical Tape, 2x2 (in/yd) 3 x Per Week/30 Days Discharge Instructions: Secure kerlix Wound #9 - Toe Second Wound Laterality: Right Cleanser: Normal Saline 3 x Per Week/30 Days Discharge Instructions: Wash your hands with soap and water. Remove old dressing, discard into plastic bag and place into trash. Cleanse the wound with Normal Saline prior to applying a clean dressing using gauze sponges, not tissues or cotton balls. Do not scrub or use excessive force. Pat dry using gauze sponges, not tissue or cotton balls. Peri-Wound Care: Desitin Maximum Strength Ointment 4 (oz) 3 x Per Week/30 Days Discharge Instructions: Apply zinc periwound Primary Dressing: Hydrofera Blue Ready Transfer Foam, 2.5x2.5 (in/in) 3 x Per Week/30 Days Discharge Instructions: Apply Hydrofera Blue Ready to wound bed as directed (EXTRA SENT WITH PATIENT, PLEASE USE) Secondary Dressing: Coverlet Latex-Free Fabric Adhesive Dressings 3 x Per Week/30 Days Discharge Instructions: 1.5 x 2 Secured With: 62M  Medipore H Soft Cloth Surgical Tape, 2x2 (in/yd) 3 x Per Week/30 Days Chad [name], Chad [name] ([id number]) Discharge Instructions: Secure kerlix Radiology o MRI with and without Contrast right 2nd toe - non healing wound , bone culture positive for osteomylitis , - (ICD10 G18.299 - Pressure ulcer of other site, stage 4) Electronic Signature(s) Signed: 06/17/2021 5:26:28 PM By: Worthy Keeler PA-C Signed: 07/03/2021 1:44:34 PM By: Carlene Coria RN Entered By: Carlene Coria on 06/17/2021 08:51:53 Chad [name] ([id number]) -------------------------------------------------------------------------------- Problem List Details Patient Name: Chad [name] Date of Service: 06/17/2021 8:00 AM Medical Record Number: [id number] Patient Account Number: [id number] Date of Birth/Sex: 1960/08/30 (60 y.o. 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 08/14/2020 No Yes non-dominant side Inactive Problems ICD-10 Code Description Active Date Inactive Date L97.412 Non-pressure chronic ulcer of right heel and midfoot with fat layer exposed 08/14/2020 08/14/2020 L97.512 Non-pressure chronic ulcer of other part of right foot with fat layer exposed 08/14/2020 08/14/2020 Resolved Problems Electronic Signature(s) Signed: 06/17/2021 5:26:28 PM By: Worthy Keeler PA-C Entered By: Worthy Keeler on 06/17/2021 08:22:32 Chad [name]  ([id number]) -------------------------------------------------------------------------------- Progress Note Details Patient Name: Chad [name] Date of Service: 06/17/2021 8:00 AM Medical Record Number: [id number] Patient Account Number: [id number] Date of Birth/Sex: [birth year] (60 y.o. M) Treating RN: Carlene Coria Primary Care Provider: SYSTEM, PCP Other Clinician: Referring Provider: Clayborn Bigness Treating Provider/Extender: Skipper Cliche in Treatment: 43 Subjective Chief Complaint Information obtained from Patient Left ankle and right foot ulcers History of Present Illness (HPI) 10/08/18 on evaluation today patient actually presents to our office for initial evaluation concerning wounds that he has of the bilateral lower extremities. 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 He also has bilateral venous stasis. He apparently has been homeless currently following discharge from the hospital apparently he has been placed at [facility] which is is a skilled nursing facility locally. Nonetheless fortunately he does not show any signs of infection at this time which is good news. In fact several of the wound actually appears to be showing some signs of improvement already in my pinion. There are a couple areas in the left leg in particular there likely gonna require some sharp debridement to help clear away some necrotic tissue and help with more sufficient healing. No fevers, chills, nausea, or vomiting noted at this time. 10/15/18 on evaluation today patient actually appears to be doing very well in regard to his bilateral lower extremities. He's been tolerating the dressing changes without complication. Fortunately there does not appear to be any evidence of active infection at this time which is great news. Overall I'm  actually very pleased with how this has progressed in just one visits time. Readmission: 08/14/2020 upon evaluation today patient presents for re-evaluation here in our clinic. He is having issues with his left ankle region as well as his right toe and his right heel. He tells me that the toe and heel actually began as a area that was itching that he was scratching and then subsequently opened up into wounds. These may have been abscess areas I presume based on what I am seeing currently. With regard to his left ankle region he tells me this was a similar type occurrence although he does have venous stasis this very well may be more of a venous leg ulcer more than anything. Nonetheless I do believe that the patient would benefit from appropriate and aggressive wound care to try to help get things under better control here. He does have history of a stroke on the left side affecting him to some degree there that he is able to stand although he does have some residual weakness. Otherwise again the patient does have chronic venous insufficiency as previously noted. His arterial studies most recently obtained showed that he had an ABI on the right of 1.16 with a TBI of 0.52 and on the left and ABI of 1.14 with a TBI of 0.81. That was obtained on 06/19/2020. 08/28/2020 upon evaluation today patient appears to be doing decently well in regard  to his wounds in general. He has been tolerating the dressing changes without complication. Fortunately there does not appear to be any signs of active infection which is great news. With that being said I think the [facility] is doing a good job I would recommend that we likely continue with that currently. 09/11/2020 upon evaluation today patient's wounds did not appear to be doing too poorly but again he is not really showing signs of significant improvement with regard to any of the wounds on the right. None of them have Hydrofera Blue on them I am not exactly sure  why this is not being followed as the facility did not contact us to let us know of any issues with obtaining dressings or otherwise. With that being said he is supposed to be using Hydrofera Blue on both of the wounds on the right foot as well as the ankle wound on the left side. 09/18/2020 upon evaluation today patient appears to be doing poorly with regard to his wounds. Again right now the left ankle in particular showed signs of extreme maceration. Apparently he was told by someone with staff at [facility] [place] they could not get the [facility]. With that being said this is something that is never been relayed to [place] one way or another. Also the patient subsequently has not supposed to have a border gauze dressing on. He should have an ABD pad and roll gauze to secure as this drains much too much just to have a border gauze dressing to cover. Nonetheless the fact that they are not using the appropriate dressing is directly causing deterioration of the left ankle wound it is significantly worse today compared to what it was previous. I did attempt to call [facility] while the patient was here I called three times and got no one to even pick up the phone. After this I had my for an office coordinator call and she was able to finally get through and leave a message with the D ON as of dictation of this note which is roughly about an hour and a half later I still have not been able to speak with anyone at the facility. 09/25/2020 upon evaluation today patient actually showing signs of good improvement which is excellent news. He has been tolerating the dressing changes without complication. Fortunately there is no signs of active infection which is great news. No fevers, chills, nausea, vomiting, or diarrhea. I do feel like the facility has been doing a much better job at taking care of him as far as the dressings are concerned. However the director of nursing never did call me  back. 10/09/2020 upon evaluation today patient appears to be doing well with regard to his wound. The toe ulcer did require some debridement but the other 2 areas actually appear to be doing quite well. 10/19/2020 upon evaluation today patient actually appears to be doing very well in regard to his wounds. In fact the heel does appear to be completely healed. The toe is doing better in the medial ankle on the left is also doing better. Overall I think he is headed in the right direction. 10/26/2020 upon evaluation today patient appears to be doing well with regard to his wound. He is showing signs of improvement which is great news and overall I am very pleased with where things stand today. No fevers, chills, nausea, vomiting, or diarrhea. 11/02/2020 upon evaluation today patient appears to be doing well with regard to his wounds. He  has been tolerating the dressing changes without complication overall I am extremely pleased with where things stand today. He in regard to the toe is almost completely healed and the medial [facility], Keyshawn ([id number]) ankle on the left is doing much better. 11/09/2020 upon evaluation today patient appears to be doing a little poorly in regard to his left medial ankle ulcer. Fortunately there does not appear to be any signs of systemic infection but unfortunately locally he does appear to be infected in fact he has blue-green drainage consistent with Pseudomonas. 11/16/2020 upon evaluation today patient appears to be doing well with regard to his wound. It actually appears to be doing better. I did place him on gentamicin cream since the Cipro was actually resistant even though he was positive for Pseudomonas on culture. Overall I think that he does seem to be doing better though I am unsure whether or not they have actually been putting the cream on. The patient is not sure that we did talk to the nurse directly and she was going to initiate that treatment. Fortunately there  does not appear to be any signs of active infection at this time. No fevers, chills, nausea, vomiting, or diarrhea. 4/28; the area on the right second toe is close to healed. Left medial ankle required debridement 12/07/2020 upon evaluation today patient appears to be doing well with regard to his wounds. In fact the right second toe appears to be completely healed which is great news. Fortunately there does not appear to be any signs of active infection at this time which is also great news. I think we can probably discontinue the gentamicin on top of everything else. 12/14/2020 upon evaluation today patient appears to be doing well with regard to his wound. He is making good progress and overall very pleased with where things stand today. There is no signs of active infection at this time which is great news. 12/28/2020 upon evaluation today patient appears to be doing well with regard to his wounds. He has been tolerating the dressing changes without complication. Fortunately there is no signs of active infection at this time. No fevers, chills, nausea, vomiting, or diarrhea. 12/28/2020 upon evaluation today patient's wound bed actually showed signs of excellent improvement. He has great epithelization and granulation I do not see any signs of infection overall I am extremely pleased with where things stand at this point. No fevers, chills, nausea, vomiting, or diarrhea. 01/11/2021 upon evaluation today patient appears to be doing well with regard to his wound on his leg. He has been tolerating the dressing changes without complication. Fortunately there does not appear to be any signs of active infection which is great news. No fevers, chills, nausea, vomiting, or diarrhea. 01/25/2021 upon evaluation today patient appears to be doing well with regard to his wound. He has been tolerating the dressing changes without complication. Fortunately the collagen seems to be doing a great job which is excellent  news. No fevers, chills, nausea, vomiting, or diarrhea. 02/08/2021 upon evaluation today patient's wound is actually looking a little bit worse especially in the periwound compared to previous. Fortunately there does not appear to be any signs of infection which is great news with that being said he does have some irritation around the periphery of the wound which has me more concerned. He actually had a dressing on that had not been changed in 3 days. He also is supposed to have daily dressing changes. With regard to the dressing applied he had a  silver alginate dressing and silver collagen is what is recommended and ordered. He also had no Desitin around the edges of the wound in the periwound region although that is on the order inspect to be done as well. In general I was very concerned I did contact [facility] actually spoke with Magda Paganini who is the scheduling individual and subsequently she stated that she would pass the information to the D ON apparently the D ON was not available to talk to me when I call today. 02/18/2021 upon evaluation today patient's wound is actually showing signs of improvement. Fortunately there does not appear to be any evidence of infection which is great news overall I am extremely pleased with where things stand today. No fevers, chills, nausea, vomiting, or diarrhea. 8/3; patient presents for 1 week follow-up. He has no issues or complaints today. He denies signs of infection. 03/11/2021 upon evaluation today patient appears to be doing well with regard to his wound. He does have a little bit of slough noted on the surface of the wound but fortunately there does not appear to be any signs of active infection at this time. No fevers, chills, nausea, vomiting, or diarrhea. 03/18/2021 upon evaluation today patient appears to be doing well with regard to his wound. He has been tolerating the dressing changes without complication. There was a little irritation more  proximal to where the wound was that was not noted last week but nonetheless this is very superficial just seems to be more irritation we just need to make sure to put a good amount of the zinc over the area in my opinion. Otherwise he does not seem to be doing significantly worse at all which is great news. 03/25/2021 upon evaluation today patient appears to be doing well with regard to his wound. He is going require some sharp debridement today to clear with some of the necrotic debris. I did perform this today without complication postdebridement wound bed appears to be doing much better this is great news. 04/08/2021 upon evaluation today patient appears to be doing decently well in regard to his wound although the overall measurement is not significantly smaller compared to previous. It is gone down a little bit but still the facility continues to not really put the appropriate dressings in place in fact he was supposed to have collagen we think he probably had more of an allergy to At this point. Fortunately there does not appear to be any signs of active infection systemically though locally I do not see anything on initial visualization either as far as erythema or warmth. 04/15/2021 upon evaluation today patient appears to be doing well with regard to his wound. He is actually showing signs of improvement. I did place him on antibiotics last week, Cipro. He has been taking that 2 times a day and seems to be tolerating it very well. I do not see any evidence of worsening and in fact the overall appearance of the wound is smaller today which is also great news. 9/26; left medial ankle chronic venous insufficiency wound is improved. Using Hydrofera Blue 10/10; left medial ankle chronic venous insufficiency. Wound has not changed much in appearance completely nonviable surface. Apparently there have been problems getting the right product on the wound at the facility although he came in with  [facility] on today 05/14/2021 upon evaluation today patient appears to be doing well with regard to his wound. I think he is making progress here which is good news. Fortunately  there does not appear to be any signs of active infection at this time. No fevers, chills, nausea, vomiting, or diarrhea. 05/20/2021 upon evaluation today patient appears to be doing well with regard to his wound. He is showing signs of good improvement which is great news. There does not appear to be any evidence of active infection which is also excellent news. No fevers, chills, nausea, vomiting, or diarrhea. Chad [name], Chad [name] ([id number]) 05/28/2021 upon evaluation today patient appears to be doing quite well. There does not appear to be any signs of active infection at this time which is great news. Overall I am extremely pleased with where things stand today. I think he is headed in the right direction. 06/11/2021 upon evaluation today patient appears to be doing well with regard to his left ankle ulcer and poorly in regard to the toe ulcer on the second toe right foot. This appears to show signs of joint exposure. Apparently this has been present for 1 to 2 months although he kept forgetting to tell me about it. That is unfortunate as right now it definitely appears to be doing significantly worse than what I would like to see. There does not appear to be any signs of active infection systemically though locally I am concerned about the possibility of infection the toe is quite red. Again no one from the facility ever contacted [place] to advise that this was going on in the interim either. 06/17/2021 upon evaluation today patient presents for follow-up I did review his x-ray which showed a navicular bone fracture I am unsure of the chronicity of this. Subsequently he also had osteomyelitis of the toe which was what I was more concerned about this did not show up on x-ray but did show up on the pathology scrapings. This  was listed as acute osteomyelitis. Nonetheless at this point I think that the antibiotic treatment is the best regimen to go with currently. The patient is in agreement with that plan. Nonetheless he has initially 30 days of doxycycline off likely extend that towards the end of the treatment cycle that will be around the middle of December for an additional 2 weeks. That all depends on how well he continues to heal. Nonetheless based on what I am seeing in the foot I did want a proceed with an MRI as well which I think will be helpful to identify if there is anything else that needs to be addressed from the standpoint of infection. Objective Constitutional Well-nourished and well-hydrated in no acute distress. Vitals Time Taken: 8:07 AM, Height: 67 in, Weight: 160 lbs, BMI: 25.1, Temperature: 97.8 F, Pulse: 78 bpm, Respiratory Rate: 18 breaths/min, Blood Pressure: 130/84 mmHg. Respiratory normal breathing without difficulty. Psychiatric this patient is able to make decisions and demonstrates good insight into disease process. Alert and Oriented x 3. pleasant and cooperative. General Notes: Based on what I am seeing today I do believe that the patient's wounds on the toe actually appear to be doing significantly better which is great news. I do not see any signs of active infection systemically at this point. With that being said I do think that there is definitive evidence locally that the patient is having an ongoing infection and that needs to be addressed. We do have him on oral medications currently, doxycycline, and that seems to be a little bit better this week. Integumentary (Hair, Skin) Wound #8 status is Open. Original cause of wound was Gradually Appeared. The date acquired was: 07/12/2019. The wound has  been in treatment 43 weeks. The wound is located on the Left,Medial Ankle. The wound measures 0.7cm length x 0.3cm width x 0.1cm depth; 0.165cm^2 area and 0.016cm^3 volume. There is  Fat Layer (Subcutaneous Tissue) exposed. There is no tunneling or undermining noted. There is a medium amount of serosanguineous drainage noted. The wound margin is flat and intact. There is medium (34-66%) red granulation within the wound bed. There is a medium (34-66%) amount of necrotic tissue within the wound bed including Adherent Slough. Wound #9 status is Open. Original cause of wound was Gradually Appeared. The date acquired was: 05/31/2021. The wound is located on the Right Toe Second. The wound measures 0.5cm length x 1cm width x 0.2cm depth; 0.393cm^2 area and 0.079cm^3 volume. There is bone, joint, tendon, and Fat Layer (Subcutaneous Tissue) exposed. There is no tunneling or undermining noted. There is a medium amount of serosanguineous drainage noted. There is small (1-33%) pink granulation within the wound bed. There is a large (67-100%) amount of necrotic tissue within the wound bed including Adherent Slough. 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 There was a Excisional Skin/Subcutaneous Tissue Debridement with a total area of 0.21 sq cm performed by Tommie Sams., PA-C. With the following instrument(s): Curette to remove Viable and Non-Viable tissue/material. Material removed includes Subcutaneous Tissue, Slough, Skin: Dermis, Skin: Epidermis, and Biofilm after achieving pain control using Lidocaine 4% Topical Solution. No specimens were taken. A time out was conducted at 08:38, prior to the start of the procedure. A Minimum amount of bleeding was  controlled with Pressure. The procedure was tolerated well with a pain level of 0 throughout and a pain level of 0 following the procedure. Post Debridement Measurements: 0.7cm length x 0.3cm width x 0.1cm depth; 0.016cm^3 volume. Character of Wound/Ulcer Post Debridement is improved. Severity of Tissue Post Debridement is: Fat layer exposed. Post procedure Diagnosis Wound #8: Same as Pre-Procedure Wound #9 Pre-procedure diagnosis of Wound #9 is a Pressure Ulcer located on the Right Toe Second . There was a Excisional Skin/Subcutaneous Tissue Debridement with a total area of 0.5 sq cm performed by Tommie Sams., PA-C. With the following instrument(s): Curette to remove Viable and Non-Viable tissue/material. Material removed includes Subcutaneous Tissue, Slough, Skin: Dermis, Skin: Epidermis, and Biofilm after achieving pain control using Lidocaine 4% Topical Solution. No specimens were taken. A time out was conducted at 08:38, prior to the start of the procedure. A Minimum amount of bleeding was controlled with Pressure. The procedure was tolerated well with a pain level of 0 throughout and a pain level of 0 following the procedure. Post Debridement Measurements: 0.5cm length x 1cm width x 0.2cm depth; 0.079cm^3 volume. Post debridement Stage noted as Category/Stage IV. Character of Wound/Ulcer Post Debridement is improved. Post procedure Diagnosis Wound #9: Same as Pre-Procedure Plan Follow-up Appointments: Return Appointment in 1 week. Bathing/ Shower/ Hygiene: May shower; gently cleanse wound with antibacterial soap, rinse and pat dry prior to dressing wounds Edema Control - Lymphedema / Segmental Compressive Device / Other: Elevate legs to the level of the heart and pump ankles as often as possible Elevate leg(s) parallel to the floor when sitting. DO  YOUR BEST to sleep in the bed at night. DO NOT sleep in your recliner. Long hours of sitting in a recliner leads to swelling of the legs  and/or potential wounds on your backside. Radiology ordered were: MRI with and without Contrast right 2nd toe - non healing wound , bone culture positive for osteomylitis , WOUND #8: - Ankle Wound Laterality: Left, Medial Cleanser: Normal Saline 3 x Per Week/30 Days Discharge Instructions: Wash your hands with soap and water. Remove old dressing, discard into plastic bag and place into trash. Cleanse the wound with Normal Saline prior to applying a clean dressing using gauze sponges, not tissues or cotton balls. Do not scrub or use excessive force. Pat dry using gauze sponges, not tissue or cotton balls. Peri-Wound Care: Desitin Maximum Strength Ointment 4 (oz) 3 x Per Week/30 Days Discharge Instructions: Apply zinc periwound Primary Dressing: Hydrofera Blue Ready Transfer Foam, 2.5x2.5 (in/in) 3 x Per Week/30 Days Discharge Instructions: Apply Hydrofera Blue Ready to wound bed as directed (EXTRA SENT WITH PATIENT, PLEASE USE) Secondary Dressing: Kerlix 4.5 x 4.1 (in/yd) 3 x Per Week/30 Days Discharge Instructions: Apply kerlix over ABD pad Secured With: 55M Medipore H Soft Cloth Surgical Tape, 2x2 (in/yd) 3 x Per Week/30 Days Discharge Instructions: Secure kerlix WOUND #9: - Toe Second Wound Laterality: Right Cleanser: Normal Saline 3 x Per Week/30 Days Discharge Instructions: Wash your hands with soap and water. Remove old dressing, discard into plastic bag and place into trash. Cleanse the wound with Normal Saline prior to applying a clean dressing using gauze sponges, not tissues or cotton balls. Do not scrub or use excessive force. Pat dry using gauze sponges, not tissue or cotton balls. Peri-Wound Care: Desitin Maximum Strength Ointment 4 (oz) 3 x Per Week/30 Days Discharge Instructions: Apply zinc periwound Primary Dressing: Hydrofera Blue Ready Transfer Foam, 2.5x2.5 (in/in) 3 x Per Week/30 Days Discharge Instructions: Apply Hydrofera Blue Ready to wound bed as directed (EXTRA SENT  WITH PATIENT, PLEASE USE) Secondary Dressing: Coverlet Latex-Free Fabric Adhesive Dressings 3 x Per Week/30 Days Discharge Instructions: 1.5 x 2 Secured With: 55M Medipore H Soft Cloth Surgical Tape, 2x2 (in/yd) 3 x Per Week/30 Days Discharge Instructions: Secure kerlix 1. Would recommend currently that we going to continue with the wound care measures as before and the patient is in agreement with the plan. This includes the use of the [facility] dressing which I think is doing a good job currently. 2. Also can recommend that we continue to cover with a coverlet over the toe and an ABD and roll gauze for the left ankle. Chad [name], Chad [name] ([id number]) 3. I am also can recommend the patient should continue to monitor and let me know if anything changes or worsens if he has any issues I need to know that as soon as possible. 4. I am can also go ahead and get him set up for an MRI I think this needs to be done as soon as possible. We will see patient back for reevaluation in 1 week here in the clinic. If anything worsens or changes patient will contact our office for additional recommendations. Electronic Signature(s) Signed: 06/17/2021 10:18:17 AM By: Worthy Keeler PA-C Entered By: Worthy Keeler on 06/17/2021 10:18:16 Chad [name] ([id number]) -------------------------------------------------------------------------------- SuperBill Details Patient Name: Chad [name] Date of Service: 06/17/2021 Medical Record Number: [id number] Patient Account Number: [id number] Date of Birth/Sex: [birth year] (60 y.o. 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 Facility Procedures CPT4 Code: 34356861 Description: 68372 - DEB SUBQ TISSUE 20 SQ CM/< Modifier: Quantity: 1 CPT4 Code: Description: ICD-10 Diagnosis Description B02.111 Non-pressure chronic ulcer of left ankle with fat layer exposed Modifier: Quantity: Physician Procedures CPT4 Code: 5520802 Description: 23361 - WC PHYS LEVEL 4 - EST PT Modifier: 25 Quantity: 1 CPT4 Code: Description: ICD-10 Diagnosis Description I87.2 Venous insufficiency (chronic) (peripheral) L97.322 Non-pressure chronic ulcer of left ankle with fat layer exposed 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

## 2021-07-03 NOTE — Telephone Encounter (Signed)
Called Chad [name] back to let her know that the patient will not be getting a colonoscopy at this time. I also informed her that if she had further questions about it, to call the cancer center with Dr. [name] to ask if the patient will need a colooscopy or not.

## 2021-07-03 NOTE — Progress Notes (Signed)
Pt in for follow up, currently at [facility].  Denies any changes or concerns today.

## 2021-07-03 NOTE — Assessment & Plan Note (Addendum)
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 On keytruda- OCT 4th, 2022-  The irregular/ill-defined wall thickening seen previously in the right renal pelvis and proximal ureter appears decreased in the interval with less enhancing tissue visible on today's study, but this is a subtle change. Stable 10 mm short axis portal caval lymph node.STABLE.   # Proceed with  [place] today; Labs today reviewed; Labs today reviewed;  acceptable for treatment today. will repeat scan in Jan 2023/willorder at next visit.  # Incidental on CT OCT 10th, 2022-Interval development of a 13 mm lobular polypoid lesion right mainstem- mucus/secretion Vs true lesion- will repeat scan in Jan 2023/willorder at next visit.  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 Discussed with Dr.Byrnett/Dr.Anna  # Iron deficiency anemia-hemoglobin 11-12; February 2022 iron studies/ferritin-LOW; hold Venofer with infusions- STABLE.   # Bilateral LE ulcers-s/p wound care evaluation; s/p PTCA with Dr.Dew. bil Arterial Dopp-wnl-STABLE.   #DISPOSITION: #  [name];  # follow up in 3 weeks-;MD labs-cbc/cmp;TSH  Keytruda; ;Dr.B.

## 2021-07-03 NOTE — Patient Instructions (Signed)
[facility]  Discharge Instructions: ?Thank you for choosing [facility] to provide your oncology and hematology care.  ?If you have a lab appointment with the Cancer Center, please go directly to the Cancer Center and check in at the registration area. ? ?Wear comfortable clothing and clothing appropriate for easy access to any Portacath or PICC line.  ? ?We strive to give you quality time with your provider. You may need to reschedule your appointment if you arrive late (15 or more minutes).  Arriving late affects you and other patients whose appointments are after yours.  Also, if you miss three or more appointments without notifying the office, you may be dismissed from the clinic at the provider?s discretion.    ?  ?For prescription refill requests, have your pharmacy contact our office and allow 72 hours for refills to be completed.   ? ?Today you received the following chemotherapy and/or immunotherapy agents KEYTRUDA ?    ?  ?To help prevent nausea and vomiting after your treatment, we encourage you to take your nausea medication as directed. ? ?BELOW ARE SYMPTOMS THAT SHOULD BE REPORTED IMMEDIATELY: ?*FEVER GREATER THAN 100.4 F (38 ?C) OR HIGHER ?*CHILLS OR SWEATING ?*NAUSEA AND VOMITING THAT IS NOT CONTROLLED WITH YOUR NAUSEA MEDICATION ?*UNUSUAL SHORTNESS OF BREATH ?*UNUSUAL BRUISING OR BLEEDING ?*URINARY PROBLEMS (pain or burning when urinating, or frequent urination) ?*BOWEL PROBLEMS (unusual diarrhea, constipation, pain near the anus) ?TENDERNESS IN MOUTH AND THROAT WITH OR WITHOUT PRESENCE OF ULCERS (sore throat, sores in mouth, or a toothache) ?UNUSUAL RASH, SWELLING OR PAIN  ?UNUSUAL VAGINAL DISCHARGE OR ITCHING  ? ?Items with * indicate a potential emergency and should be followed up as soon as possible or go to the Emergency Department if any problems should occur. ? ?Please show the CHEMOTHERAPY ALERT CARD or IMMUNOTHERAPY ALERT CARD at check-in to  the Emergency Department and triage nurse. ? ?Should you have questions after your visit or need to cancel or reschedule your appointment, please contact [facility]  [phone] and follow the prompts.  Office hours are 8:00 a.m. to 4:30 p.m. Monday - Friday. Please note that voicemails left after 4:00 p.m. may not be returned until the following business day.  We are closed weekends and major holidays. You have access to a nurse at all times for urgent questions. Please call the main number to the clinic [phone] and follow the prompts. ? ?For any non-urgent questions, you may also contact your provider using MyChart. We now offer e-Visits for anyone 18 and older to request care online for non-urgent symptoms. For details visit [email or web address]. ?  ?Also download the MyChart app! Go to the app store, search "MyChart", open the app, select [place], and log in with your MyChart username and password. ? ?Due to Covid, a mask is required upon entering the hospital/clinic. If you do not have a mask, one will be given to you upon arrival. For doctor visits, patients may have 1 support person aged 18 or older with them. For treatment visits, patients cannot have anyone with them due to current Covid guidelines and our immunocompromised population.  ? ?Pembrolizumab injection ?What is this medication? ?PEMBROLIZUMAB (pem broe liz ue mab) is a monoclonal antibody. It is used to treat certain types of cancer. ?This medicine may be used for other purposes; ask your health care provider or pharmacist if you have questions. ?COMMON BRAND NAME(S): Keytruda ?What should I tell my care   team before I take this medication? ?They need to know if you have any of these conditions: ?autoimmune diseases like Crohn's disease, ulcerative colitis, or lupus ?have had or planning to have an allogeneic stem cell transplant (uses someone else's stem cells) ?history of organ transplant ?history  of chest radiation ?nervous system problems like myasthenia gravis or Guillain-Barre syndrome ?an unusual or allergic reaction to pembrolizumab, other medicines, foods, dyes, or preservatives ?pregnant or trying to get pregnant ?breast-feeding ?How should I use this medication? ?This medicine is for infusion into a vein. It is given by a health care professional in a hospital or clinic setting. ?A special MedGuide will be given to you before each treatment. Be sure to read this information carefully each time. ?Talk to your pediatrician regarding the use of this medicine in children. While this drug may be prescribed for children as young as 6 months for selected conditions, precautions do apply. ?Overdosage: If you think you have taken too much of this medicine contact a poison control center or emergency room at once. ?NOTE: This medicine is only for you. Do not share this medicine with others. ?What if I miss a dose? ?It is important not to miss your dose. Call your doctor or health care professional if you are unable to keep an appointment. ?What may interact with this medication? ?Interactions have not been studied. ?This list may not describe all possible interactions. Give your health care provider a list of all the medicines, herbs, non-prescription drugs, or dietary supplements you use. Also tell them if you smoke, drink alcohol, or use illegal drugs. Some items may interact with your medicine. ?What should I watch for while using this medication? ?Your condition will be monitored carefully while you are receiving this medicine. ?You may need blood work done while you are taking this medicine. ?Do not become pregnant while taking this medicine or for 4 months after stopping it. Women should inform their doctor if they wish to become pregnant or think they might be pregnant. There is a potential for serious side effects to an unborn child. Talk to your health care professional or pharmacist for more  information. Do not breast-feed an infant while taking this medicine or for 4 months after the last dose. ?What side effects may I notice from receiving this medication? ?Side effects that you should report to your doctor or health care professional as soon as possible: ?allergic reactions like skin rash, itching or hives, swelling of the face, lips, or tongue ?bloody or black, tarry ?breathing problems ?changes in vision ?chest pain ?chills ?confusion ?constipation ?cough ?diarrhea ?dizziness or feeling faint or lightheaded ?fast or irregular heartbeat ?fever ?flushing ?joint pain ?low blood counts - this medicine may decrease the number of white blood cells, red blood cells and platelets. You may be at increased risk for infections and bleeding. ?muscle pain ?muscle weakness ?pain, tingling, numbness in the hands or feet ?persistent headache ?redness, blistering, peeling or loosening of the skin, including inside the mouth ?signs and symptoms of high blood sugar such as dizziness; dry mouth; dry skin; fruity breath; nausea; stomach pain; increased hunger or thirst; increased urination ?signs and symptoms of kidney injury like trouble passing urine or change in the amount of urine ?signs and symptoms of liver injury like dark urine, light-colored stools, loss of appetite, nausea, right upper belly pain, yellowing of the eyes or skin ?sweating ?swollen lymph nodes ?weight loss ?Side effects that usually do not require medical attention (report to your doctor   or health care professional if they continue or are bothersome): ?decreased appetite ?hair loss ?tiredness ?This list may not describe all possible side effects. Call your doctor for medical advice about side effects. You may report side effects to FDA at 1-800-FDA-1088. ?Where should I keep my medication? ?This drug is given in a hospital or clinic and will not be stored at home. ?NOTE: This sheet is a summary. It may not cover all possible information. If you  have questions about this medicine, talk to your doctor, pharmacist, or health care provider. ?? 2022 [employer]vier/Gold Standard (2021-04-02 00:00:00) ? ?

## 2021-07-03 NOTE — Progress Notes (Signed)
Chad [name] ([id number]) Visit Report for 06/17/2021 Arrival Information Details Patient Name: Chad [name] Date of Service: 06/17/2021 8:00 AM Medical Record Number: [id number] Patient Account Number: [id number] Date of Birth/Sex: May 12, 1961 (60 y.o. M) Treating RN: Carlene Coria Primary Care Sheron Robin: SYSTEM, PCP Other Clinician: Referring Georgianne Gritz: Clayborn Bigness Treating Milika Ventress/Extender: Skipper Cliche in Treatment: 43 Visit Information History Since Last Visit All ordered tests and consults were completed: No Patient Arrived: Wheel Chair Added or deleted any medications: No Arrival Time: 08:03 Any new allergies or adverse reactions: No Accompanied By: self Had a fall or experienced change in No Transfer Assistance: None activities of daily living that may affect Patient Identification Verified: Yes risk of falls: Secondary Verification Process Completed: Yes Signs or symptoms of abuse/neglect since last visito No Patient Requires Transmission-Based No Hospitalized since last visit: No Precautions: Implantable device outside of the clinic excluding No Patient Has Alerts: Yes cellular tissue based products placed in the center Patient Alerts: Patient on Blood since last visit: Thinner Has Dressing in Place as Prescribed: Yes ABI right 1.16 Pain Present Now: No ABI left 1.14 Electronic Signature(s) Signed: 07/03/2021 1:44:34 PM By: Carlene Coria RN Entered By: Carlene Coria on 06/17/2021 08:07:49 Chad [name] ([id number]) -------------------------------------------------------------------------------- Clinic Level of Care Assessment Details Patient Name: Chad [name] Date of Service: 06/17/2021 8:00 AM Medical Record Number: [id number] Patient Account Number: [id number] Date of Birth/Sex: 04/08/1961 (60 y.o. M) Treating RN: Carlene Coria Primary Care Marice Guidone: SYSTEM, PCP Other Clinician: Referring Geniyah Eischeid: Clayborn Bigness Treating Jaceion Aday/Extender: Skipper Cliche in Treatment: 43 Clinic Level of Care Assessment Items TOOL 1 Quantity Score '[]'  - Use when EandM and Procedure is performed on INITIAL visit 0 ASSESSMENTS - Nursing Assessment / Reassessment '[]'  - General Physical Exam (combine w/ comprehensive assessment (listed just below) when performed on new 0 pt. evals) '[]'  - 0 Comprehensive Assessment (HX, ROS, Risk Assessments, Wounds Hx, etc.) ASSESSMENTS - Wound and Skin Assessment / Reassessment '[]'  - Dermatologic / Skin Assessment (not related to wound area) 0 ASSESSMENTS - Ostomy and/or Continence Assessment and Care '[]'  - Incontinence Assessment and Management 0 '[]'  - 0 Ostomy Care Assessment and Management (repouching, etc.) PROCESS - Coordination of Care '[]'  - Simple Patient / Family Education for ongoing care 0 '[]'  - 0 Complex (extensive) Patient / Family Education for ongoing care '[]'  - 0 Staff obtains Programmer, systems, Records, Test Results / Process Orders '[]'  - 0 Staff telephones HHA, Nursing Homes / Clarify orders / etc '[]'  - 0 Routine Transfer to another Facility (non-emergent condition) '[]'  - 0 Routine Hospital Admission (non-emergent condition) '[]'  - 0 New Admissions / Biomedical engineer / Ordering NPWT, Apligraf, etc. '[]'  - 0 Emergency Hospital Admission (emergent condition) PROCESS - Special Needs '[]'  - Pediatric / Minor Patient Management 0 '[]'  - 0 Isolation Patient Management '[]'  - 0 Hearing / Language / Visual special needs '[]'  - 0 Assessment of Community assistance (transportation, D/C planning, etc.) '[]'  - 0 Additional assistance / Altered mentation '[]'  - 0 Support Surface(s) Assessment (bed, cushion, seat, etc.) INTERVENTIONS - Miscellaneous '[]'  - External ear exam 0 '[]'  - 0 Patient Transfer (multiple staff / Civil Service fast streamer / Similar devices) '[]'  - 0 Simple Staple / Suture removal (25 or less) '[]'  - 0 Complex Staple / Suture removal (26 or more) '[]'  - 0 Hypo/Hyperglycemic Management (do not check if billed  separately) '[]'  - 0 Ankle / Brachial Index (ABI) - do not check if billed separately Has the patient been seen at the hospital within  the last three years: Yes Total Score: 0 Level Of Care: ____ Chad [name] ([id number]) Electronic Signature(s) Signed: 07/03/2021 1:44:34 PM By: Carlene Coria RN Entered By: Carlene Coria on 06/17/2021 08:42:25 Chad [name] ([id number]) -------------------------------------------------------------------------------- Encounter Discharge Information Details Patient Name: Chad [name] Date of Service: 06/17/2021 8:00 AM Medical Record Number: [id number] Patient Account Number: [id number] Date of Birth/Sex: 1961-04-10 (60 y.o. M) Treating RN: Carlene Coria Primary Care Vergil Burby: SYSTEM, PCP Other Clinician: Referring Marleen Moret: Clayborn Bigness Treating Hatsuko Bizzarro/Extender: Skipper Cliche in Treatment: 48 Encounter Discharge Information Items Post Procedure Vitals Discharge Condition: Stable Temperature (F): 97.8 Ambulatory Status: Wheelchair Pulse (bpm): 78 Discharge Destination: Home Respiratory Rate (breaths/min): 18 Transportation: Private Auto Blood Pressure (mmHg): 130/84 Accompanied By: self Schedule Follow-up Appointment: Yes Clinical Summary of Care: Patient Declined Electronic Signature(s) Signed: 07/03/2021 1:44:34 PM By: Carlene Coria RN Entered By: Carlene Coria on 06/17/2021 08:43:47 Chad [name] ([id number]) -------------------------------------------------------------------------------- Lower Extremity Assessment Details Patient Name: Chad [name] Date of Service: 06/17/2021 8:00 AM Medical Record Number: [id number] Patient Account Number: [id number] Date of Birth/Sex: March 23, 1961 (60 y.o. M) Treating RN: Carlene Coria Primary Care Ciera Beckum: SYSTEM, PCP Other Clinician: Referring Kee Drudge: Clayborn Bigness Treating Jaxton Casale/Extender: Skipper Cliche in Treatment: 43 Edema Assessment Assessed: [Left: No] [Right: No] [Left: Edema]  [Right: :] Calf Left: Right: Point of Measurement: 40 cm From Medial Instep 30 cm 32 cm Ankle Left: Right: Point of Measurement: 9 cm From Medial Instep 21 cm 20 cm Vascular Assessment Pulses: Dorsalis Pedis Palpable: [Left:Yes] [Right:Yes] Electronic Signature(s) Signed: 07/03/2021 1:44:34 PM By: Carlene Coria RN Entered By: Carlene Coria on 06/17/2021 08:16:00 Chad [name] ([id number]) -------------------------------------------------------------------------------- Multi Wound Chart Details Patient Name: Chad [name] Date of Service: 06/17/2021 8:00 AM Medical Record Number: [id number] Patient Account Number: [id number] Date of Birth/Sex: October 12, 1960 (60 y.o. M) Treating RN: Carlene Coria Primary Care Abuk Selleck: SYSTEM, PCP Other Clinician: Referring Ripken Rekowski: Clayborn Bigness Treating Smita Lesh/Extender: Skipper Cliche in Treatment: 43 Vital Signs Height(in): 70 Pulse(bpm): 74 Weight(lbs): 160 Blood Pressure(mmHg): 130/84 Body Mass Index(BMI): 25 Temperature(F): 97.8 Respiratory Rate(breaths/min): 18 Photos: [N/A:N/A] Wound Location: Left, Medial Ankle Right Toe Second N/A Wounding Event: Gradually Appeared Gradually Appeared N/A Primary Etiology: Venous Leg Ulcer Pressure Ulcer N/A Comorbid History: Anemia, Chronic Obstructive Anemia, Chronic Obstructive N/A Pulmonary Disease (COPD), Pulmonary Disease (COPD), Hepatitis C, Received Hepatitis C, Received Chemotherapy Chemotherapy Date Acquired: 07/12/2019 05/31/2021 N/A Weeks of Treatment: 43 0 N/A Wound Status: Open Open N/A Measurements L x W x D (cm) 0.7x0.3x0.1 0.5x1x0.2 N/A Area (cm) : 0.165 0.393 N/A Volume (cm) : 0.016 0.079 N/A % Reduction in Area: 97.20% -4.20% N/A % Reduction in Volume: 97.30% -5.30% N/A Classification: Full Thickness Without Exposed Category/Stage IV N/A Support Structures Exudate Amount: Medium Medium N/A Exudate Type: Serosanguineous Serosanguineous N/A Exudate Color: red, brown red,  brown N/A Wound Margin: Flat and Intact N/A N/A Granulation Amount: Medium (34-66%) Small (1-33%) N/A Granulation Quality: Red Pink N/A Necrotic Amount: Medium (34-66%) Large (67-100%) N/A Exposed Structures: Fat Layer (Subcutaneous Tissue): Fat Layer (Subcutaneous Tissue): N/A Yes Yes Fascia: No Tendon: Yes Tendon: No Joint: Yes Muscle: No Bone: Yes Joint: No Fascia: No Bone: No Muscle: No Epithelialization: Medium (34-66%) None N/A Treatment Notes Electronic Signature(s) Signed: 07/03/2021 1:44:34 PM By: Carlene Coria RN Entered By: Carlene Coria on 06/17/2021 08:38:00 Chad [name] ([id number]) -------------------------------------------------------------------------------- Multi-Disciplinary Care Plan Details Patient Name: Chad [name] Date of Service: 06/17/2021 8:00 AM Medical Record Number: [id number] Patient Account Number: [id number] Date of Birth/Sex: 03-25-1961 (60 y.o. M) Treating RN: Carlene Coria Primary  Care Kassadie Pancake: SYSTEM, PCP Other Clinician: Referring Morrissa Shein: Clayborn Bigness Treating Ardenia Stiner/Extender: Skipper Cliche in Treatment: 43 Active Inactive Necrotic Tissue Nursing Diagnoses: Impaired tissue integrity related to necrotic/devitalized tissue Knowledge deficit related to management of necrotic/devitalized tissue Goals: Necrotic/devitalized tissue will be minimized in the wound bed Date Initiated: 05/20/2021 Target Resolution Date: 05/20/2021 Goal Status: Active Patient/caregiver will verbalize understanding of reason and process for debridement of necrotic tissue Date Initiated: 05/20/2021 Date Inactivated: 06/17/2021 Target Resolution Date: 05/20/2021 Goal Status: Met Interventions: Assess patient pain level pre-, during and post procedure and prior to discharge Provide education on necrotic tissue and debridement process Treatment Activities: Apply topical anesthetic as ordered : 05/20/2021 Notes: Electronic Signature(s) Signed:  07/03/2021 1:44:34 PM By: Carlene Coria RN Entered By: Carlene Coria on 06/17/2021 08:34:46 Chad [name] ([id number]) -------------------------------------------------------------------------------- Pain Assessment Details Patient Name: Chad [name] Date of Service: 06/17/2021 8:00 AM Medical Record Number: [id number] Patient Account Number: [id number] Date of Birth/Sex: May 17, 1961 (60 y.o. M) Treating RN: Carlene Coria Primary Care Braylon Grenda: SYSTEM, PCP Other Clinician: Referring Ahnika Hannibal: Clayborn Bigness Treating Ellina Sivertsen/Extender: Skipper Cliche in Treatment: 43 Active Problems Location of Pain Severity and Description of Pain Patient Has Paino No Site Locations Pain Management and Medication Current Pain Management: Electronic Signature(s) Signed: 07/03/2021 1:44:34 PM By: Carlene Coria RN Entered By: Carlene Coria on 06/17/2021 08:08:25 Chad [name] ([id number]) -------------------------------------------------------------------------------- Patient/Caregiver Education Details Patient Name: Chad [name] Date of Service: 06/17/2021 8:00 AM Medical Record Number: [id number] Patient Account Number: [id number] Date of Birth/Gender: Jan 28, 1961 (60 y.o. M) Treating RN: Carlene Coria Primary Care Physician: SYSTEM, PCP Other Clinician: Referring Physician: Clayborn Bigness Treating Physician/Extender: Skipper Cliche in Treatment: 67 Education Assessment Education Provided To: Patient Education Topics Provided Wound Debridement: Methods: Explain/Verbal Responses: State content correctly Electronic Signature(s) Signed: 07/03/2021 1:44:34 PM By: Carlene Coria RN Entered By: Carlene Coria on 06/17/2021 08:42:44 Chad [name] ([id number]) -------------------------------------------------------------------------------- Wound Assessment Details Patient Name: Chad [name] Date of Service: 06/17/2021 8:00 AM Medical Record Number: [id number] Patient Account Number:  [id number] Date of Birth/Sex: 1960/08/03 (60 y.o. M) Treating RN: Carlene Coria Primary Care Daksha Koone: SYSTEM, PCP Other Clinician: Referring Jailon Schaible: Clayborn Bigness Treating Aurore Redinger/Extender: Skipper Cliche in Treatment: 43 Wound Status Wound Number: 8 Primary Venous Leg Ulcer Etiology: Wound Location: Left, Medial Ankle Wound Open Wounding Event: Gradually Appeared Status: Date Acquired: 07/12/2019 Comorbid Anemia, Chronic Obstructive Pulmonary Disease (COPD), Weeks Of Treatment: 43 History: Hepatitis C, Received Chemotherapy Clustered Wound: No Photos Wound Measurements Length: (cm) 0.7 Width: (cm) 0.3 Depth: (cm) 0.1 Area: (cm) 0.165 Volume: (cm) 0.016 % Reduction in Area: 97.2% % Reduction in Volume: 97.3% Epithelialization: Medium (34-66%) Tunneling: No Undermining: No Wound Description Classification: Full Thickness Without Exposed Support Structu Wound Margin: Flat and Intact Exudate Amount: Medium Exudate Type: Serosanguineous Exudate Color: red, brown res Foul Odor After Cleansing: No Slough/Fibrino Yes Wound Bed Granulation Amount: Medium (34-66%) Exposed Structure Granulation Quality: Red Fascia Exposed: No Necrotic Amount: Medium (34-66%) Fat Layer (Subcutaneous Tissue) Exposed: Yes Necrotic Quality: Adherent Slough Tendon Exposed: No Muscle Exposed: No Joint Exposed: No Bone Exposed: No Electronic Signature(s) Signed: 07/03/2021 1:44:34 PM By: Carlene Coria RN Entered By: Carlene Coria on 06/17/2021 08:13:46 Chad [name] ([id number]) -------------------------------------------------------------------------------- Wound Assessment Details Patient Name: Chad [name] Date of Service: 06/17/2021 8:00 AM Medical Record Number: [id number] Patient Account Number: [id number] Date of Birth/Sex: 05-14-1961 (60 y.o. M) Treating RN: Carlene Coria Primary Care Aubriegh Minch: SYSTEM, PCP Other Clinician: Referring Jenina Moening: Clayborn Bigness Treating  Clemence Lengyel/Extender: Skipper Cliche in Treatment: 43 Wound  Status Wound Number: 9 Primary Pressure Ulcer Etiology: Wound Location: Right Toe Second Wound Open Wounding Event: Gradually Appeared Status: Date Acquired: 05/31/2021 Comorbid Anemia, Chronic Obstructive Pulmonary Disease (COPD), Weeks Of Treatment: 0 History: Hepatitis C, Received Chemotherapy Clustered Wound: No Photos Wound Measurements Length: (cm) 0.5 Width: (cm) 1 Depth: (cm) 0.2 Area: (cm) 0.393 Volume: (cm) 0.079 % Reduction in Area: -4.2% % Reduction in Volume: -5.3% Epithelialization: None Tunneling: No Undermining: No Wound Description Classification: Category/Stage IV Exudate Amount: Medium Exudate Type: Serosanguineous Exudate Color: red, brown Foul Odor After Cleansing: No Slough/Fibrino Yes Wound Bed Granulation Amount: Small (1-33%) Exposed Structure Granulation Quality: Pink Fascia Exposed: No Necrotic Amount: Large (67-100%) Fat Layer (Subcutaneous Tissue) Exposed: Yes Necrotic Quality: Adherent Slough Tendon Exposed: Yes Muscle Exposed: No Joint Exposed: Yes Bone Exposed: Yes Electronic Signature(s) Signed: 07/03/2021 1:44:34 PM By: Carlene Coria RN Entered By: Carlene Coria on 06/17/2021 08:14:34 Chad [name] ([id number]) -------------------------------------------------------------------------------- Vitals Details Patient Name: Chad [name] Date of Service: 06/17/2021 8:00 AM Medical Record Number: [id number] Patient Account Number: [id number] Date of Birth/Sex: 09/14/1960 (60 y.o. M) Treating RN: Carlene Coria Primary Care Jericka Kadar: SYSTEM, PCP Other Clinician: Referring Kellon Chalk: Clayborn Bigness Treating Natacha Jepsen/Extender: Skipper Cliche in Treatment: 43 Vital Signs Time Taken: 08:07 Temperature (F): 97.8 Height (in): 67 Pulse (bpm): 78 Weight (lbs): 160 Respiratory Rate (breaths/min): 18 Body Mass Index (BMI): 25.1 Blood Pressure (mmHg): 130/84 Reference Range:  80 - 120 mg / dl Electronic Signature(s) Signed: 07/03/2021 1:44:34 PM By: Carlene Coria RN Entered By: Carlene Coria on 06/17/2021 08:08:15

## 2021-07-05 LAB — AEROBIC CULTURE W GRAM STAIN (SUPERFICIAL SPECIMEN)
Culture: NORMAL
Gram Stain: NONE SEEN

## 2021-07-08 ENCOUNTER — Other Ambulatory Visit: Payer: Self-pay

## 2021-07-08 DIAGNOSIS — I872 Venous insufficiency (chronic) (peripheral): Secondary | ICD-10-CM | POA: Diagnosis present

## 2021-07-08 DIAGNOSIS — I69354 Hemiplegia and hemiparesis following cerebral infarction affecting left non-dominant side: Secondary | ICD-10-CM | POA: Diagnosis not present

## 2021-07-08 DIAGNOSIS — L97322 Non-pressure chronic ulcer of left ankle with fat layer exposed: Secondary | ICD-10-CM | POA: Diagnosis not present

## 2021-07-08 DIAGNOSIS — B1920 Unspecified viral hepatitis C without hepatic coma: Secondary | ICD-10-CM | POA: Diagnosis not present

## 2021-07-08 DIAGNOSIS — L89894 Pressure ulcer of other site, stage 4: Secondary | ICD-10-CM | POA: Diagnosis not present

## 2021-07-08 NOTE — Progress Notes (Addendum)
Chad, [name] ([id number]) Visit Report for 07/08/2021 Arrival Information Details Patient Name: Chad [name], Chad [name] Date of Service: 07/08/2021 9:45 AM Medical Record Number: [id number] Patient Account Number: [id number] Date of Birth/Sex: May 29, 1961 (60 y.o. M) Treating RN: [name] Primary Care Asheley Hellberg: SYSTEM, PCP Other Clinician: Cornell Barman Referring Masayo Fera: Clayborn Bigness Treating Iyahna Obriant/Extender: Skipper Cliche in Treatment: 46 Visit Information History Since Last Visit Pain Present Now: No Patient Arrived: Wheel Chair Arrival Time: 09:59 Accompanied By: self Transfer Assistance: None Patient Identification Verified: Yes Secondary Verification Process Completed: Yes Patient Requires Transmission-Based No Precautions: Patient Has Alerts: Yes Patient Alerts: Patient on Blood Thinner ABI right 1.16 ABI left 1.14 Electronic Signature(s) Signed: 07/08/2021 11:55:30 AM By: Gretta Cool, BSN, RN, CWS, Kim RN, BSN Previous Signature: 07/08/2021 11:01:31 AM Version By: [name] Entered By: Gretta Cool BSN, RN, CWS, Kim on 07/08/2021 11:55:30 Chad [name] ([id number]) -------------------------------------------------------------------------------- Clinic Level of Care Assessment Details Patient Name: Chad [name] Date of Service: 07/08/2021 9:45 AM Medical Record Number: [id number] Patient Account Number: [id number] Date of Birth/Sex: [birth year] (60 y.o. M) Treating RN: [name] Primary Care Richa Shor: SYSTEM, PCP Other Clinician: Referring Woodrow Drab: Clayborn Bigness Treating Arletha Marschke/Extender: Skipper Cliche in Treatment: 46 Clinic Level of Care Assessment Items TOOL 4 Quantity Score X - Use when only an EandM is performed on FOLLOW-UP visit 1 0 ASSESSMENTS - Nursing Assessment / Reassessment []  - Reassessment of Co-morbidities (includes updates in patient status) 0 []  - 0 Reassessment of Adherence to Treatment Plan ASSESSMENTS - Wound and Skin Assessment /  Reassessment X - Simple Wound Assessment / Reassessment - one wound 1 5 []  - 0 Complex Wound Assessment / Reassessment - multiple wounds []  - 0 Dermatologic / Skin Assessment (not related to wound area) ASSESSMENTS - Focused Assessment []  - Circumferential Edema Measurements - multi extremities 0 []  - 0 Nutritional Assessment / Counseling / Intervention []  - 0 Lower Extremity Assessment (monofilament, tuning fork, pulses) []  - 0 Peripheral Arterial Disease Assessment (using hand held doppler) ASSESSMENTS - Ostomy and/or Continence Assessment and Care []  - Incontinence Assessment and Management 0 []  - 0 Ostomy Care Assessment and Management (repouching, etc.) PROCESS - Coordination of Care X - Simple Patient / Family Education for ongoing care 1 15 []  - 0 Complex (extensive) Patient / Family Education for ongoing care []  - 0 Staff obtains Programmer, systems, Records, Test Results / Process Orders []  - 0 Staff telephones HHA, Nursing Homes / Clarify orders / etc []  - 0 Routine Transfer to another Facility (non-emergent condition) []  - 0 Routine Hospital Admission (non-emergent condition) []  - 0 New Admissions / Biomedical engineer / Ordering NPWT, Apligraf, etc. []  - 0 Emergency Hospital Admission (emergent condition) X- 1 10 Simple Discharge Coordination []  - 0 Complex (extensive) Discharge Coordination PROCESS - Special Needs []  - Pediatric / Minor Patient Management 0 []  - 0 Isolation Patient Management []  - 0 Hearing / Language / Visual special needs []  - 0 Assessment of Community assistance (transportation, D/C planning, etc.) []  - 0 Additional assistance / Altered mentation []  - 0 Support Surface(s) Assessment (bed, cushion, seat, etc.) INTERVENTIONS - Wound Cleansing / Measurement Borge, Akram ([id number]) X- 1 5 Simple Wound Cleansing - one wound []  - 0 Complex Wound Cleansing - multiple wounds []  - 0 Wound Imaging (photographs - any number of wounds) []   - 0 Wound Tracing (instead of photographs) []  - 0 Simple Wound Measurement - one wound []  - 0 Complex Wound Measurement - multiple wounds INTERVENTIONS - Wound Dressings X -  Small Wound Dressing one or multiple wounds 1 10 []  - 0 Medium Wound Dressing one or multiple wounds []  - 0 Large Wound Dressing one or multiple wounds []  - 0 Application of Medications - topical []  - 0 Application of Medications - injection INTERVENTIONS - Miscellaneous []  - External ear exam 0 []  - 0 Specimen Collection (cultures, biopsies, blood, body fluids, etc.) []  - 0 Specimen(s) / Culture(s) sent or taken to Lab for analysis []  - 0 Patient Transfer (multiple staff / Civil Service fast streamer / Similar devices) []  - 0 Simple Staple / Suture removal (25 or less) []  - 0 Complex Staple / Suture removal (26 or more) []  - 0 Hypo / Hyperglycemic Management (close monitor of Blood Glucose) []  - 0 Ankle / Brachial Index (ABI) - do not check if billed separately []  - 0 Vital Signs Has the patient been seen at the hospital within the last three years: Yes Total Score: 45 Level Of Care: New/Established - Level 2 Electronic Signature(s) Signed: 07/08/2021 11:01:31 AM By: [name] Entered By: [name] on 07/08/2021 10:30:49 Chad [name] ([id number]) -------------------------------------------------------------------------------- Encounter Discharge Information Details Patient Name: Chad [name] Date of Service: 07/08/2021 9:45 AM Medical Record Number: [id number] Patient Account Number: [id number] Date of Birth/Sex: 16-Nov-1960 (60 y.o. M) Treating RN: [name] Primary Care Sacha Topor: SYSTEM, PCP Other Clinician: Cornell Barman Referring Tryson Lumley: Clayborn Bigness Treating Dior Dominik/Extender: Skipper Cliche in Treatment: 46 Encounter Discharge Information Items Discharge Condition: Stable Ambulatory Status: Wheelchair Discharge Destination: [place] Transportation:  Other Accompanied By: self/transport Schedule Follow-up Appointment: Yes Clinical Summary of Care: Electronic Signature(s) Signed: 07/08/2021 11:59:01 AM By: Gretta Cool, BSN, RN, CWS, Kim RN, BSN Previous Signature: 07/08/2021 10:29:41 AM Version By: [name] Entered By: Gretta Cool BSN, RN, CWS, Kim on 07/08/2021 11:59:01 Chad [name] ([id number]) -------------------------------------------------------------------------------- Wound Assessment Details Patient Name: Chad [name] Date of Service: 07/08/2021 9:45 AM Medical Record Number: [id number] Patient Account Number: [id number] Date of Birth/Sex: 1960-12-06 (60 y.o. M) Treating RN: [name] Primary Care Mozell Hardacre: SYSTEM, PCP Other Clinician: Cornell Barman Referring Jonee Lamore: Clayborn Bigness Treating Corde Antonini/Extender: Skipper Cliche in Treatment: 46 Wound Status Wound Number: 8 Primary Venous Leg Ulcer Etiology: Wound Location: Left, Medial Ankle Wound Open Wounding Event: Gradually Appeared Status: Date Acquired: 07/12/2019 Comorbid Anemia, Chronic Obstructive Pulmonary Disease Weeks Of Treatment: 59 History: (COPD), Hepatitis C, Received Chemotherapy Clustered Wound: No Wound Measurements Length: (cm) 1 Width: (cm) 6 Depth: (cm) 0.1 Area: (cm) 4.712 Volume: (cm) 0.471 % Reduction in Area: 20% % Reduction in Volume: 20% Epithelialization: Medium (34-66%) Wound Description Classification: Full Thickness Without Exposed Support Structu Wound Margin: Flat and Intact Exudate Amount: Medium Exudate Type: Serosanguineous Exudate Color: red, brown res Foul Odor After Cleansing: No Slough/Fibrino Yes Wound Bed Granulation Amount: Large (67-100%) Exposed Structure Granulation Quality: Red Fascia Exposed: No Necrotic Amount: Small (1-33%) Fat Layer (Subcutaneous Tissue) Exposed: Yes Necrotic Quality: Adherent Slough Tendon Exposed: No Muscle Exposed: No Joint Exposed: No Bone Exposed: No Treatment  Notes Wound #8 (Ankle) Wound Laterality: Left, Medial Cleanser Normal Saline Discharge Instruction: Wash your hands with soap and water. Remove old dressing, discard into plastic bag and place into trash. Cleanse the wound with Normal Saline prior to applying a clean dressing using gauze sponges, not tissues or cotton balls. Do not scrub or use excessive force. Pat dry using gauze sponges, not tissue or cotton balls. Peri-Wound Care Desitin Maximum Strength Ointment 4 (oz) Discharge Instruction: Apply zinc periwound Topical Primary Dressing Hydrofera Blue Ready Transfer Foam, 2.5x2.5 (  in/in) Discharge Instruction: Apply Hydrofera Blue Ready to wound bed as directed (EXTRA SENT WITH PATIENT, PLEASE USE) Secondary Dressing Kerlix 4.5 x 4.1 (in/yd) Discharge Instruction: Apply kerlix over ABD pad JALEEL, ALLEN ([id number]) Secured With [place] Surgical Tape, 2x2 (in/yd) Discharge Instruction: Secure kerlix Compression Wrap Compression Stockings Add-Ons Electronic Signature(s) Signed: 07/08/2021 11:56:00 AM By: Gretta Cool, BSN, RN, CWS, Kim RN, BSN Signed: 07/10/2021 4:05:40 PM By: [name] Previous Signature: 07/08/2021 11:01:31 AM Version By: [name] Entered By: Gretta Cool BSN, RN, CWS, Kim on 07/08/2021 11:55:59 Chad [name] ([id number]) -------------------------------------------------------------------------------- Wound Assessment Details Patient Name: Chad [name] Date of Service: 07/08/2021 9:45 AM Medical Record Number: [id number] Patient Account Number: [id number] Date of Birth/Sex: Nov 16, 1960 (60 y.o. M) Treating RN: [name] Primary Care Aneya Daddona: SYSTEM, PCP Other Clinician: Cornell Barman Referring Carrell Rahmani: Clayborn Bigness Treating Darian Ace/Extender: Skipper Cliche in Treatment: 46 Wound Status Wound Number: 9 Primary Pressure Ulcer Etiology: Wound Location: Right Toe Second Wound Open Wounding Event: Gradually  Appeared Status: Date Acquired: 05/31/2021 Comorbid Anemia, Chronic Obstructive Pulmonary Disease Weeks Of Treatment: 3 History: (COPD), Hepatitis C, Received Chemotherapy Clustered Wound: No Wound Measurements Length: (cm) 0.3 Width: (cm) 0.7 Depth: (cm) 0.2 Area: (cm) 0.165 Volume: (cm) 0.033 % Reduction in Area: 56.2% % Reduction in Volume: 56% Epithelialization: None Wound Description Classification: Category/Stage IV Wound Margin: Flat and Intact Exudate Amount: Medium Exudate Type: Purulent Exudate Color: yellow, brown, green Foul Odor After Cleansing: No Slough/Fibrino Yes Wound Bed Granulation Amount: Small (1-33%) Exposed Structure Granulation Quality: Pink Fascia Exposed: No Necrotic Amount: Large (67-100%) Fat Layer (Subcutaneous Tissue) Exposed: Yes Necrotic Quality: Adherent Slough Tendon Exposed: Yes Muscle Exposed: No Joint Exposed: Yes Bone Exposed: Yes Treatment Notes Wound #9 (Toe Second) Wound Laterality: Right Cleanser Normal Saline Discharge Instruction: Wash your hands with soap and water. Remove old dressing, discard into plastic bag and place into trash. Cleanse the wound with Normal Saline prior to applying a clean dressing using gauze sponges, not tissues or cotton balls. Do not scrub or use excessive force. Pat dry using gauze sponges, not tissue or cotton balls. Peri-Wound Care Desitin Maximum Strength Ointment 4 (oz) Discharge Instruction: Apply zinc periwound Topical Primary Dressing Hydrofera Blue Ready Transfer Foam, 2.5x2.5 (in/in) Discharge Instruction: Apply Hydrofera Blue Ready to wound bed as directed (EXTRA SENT WITH PATIENT, PLEASE USE) Secondary Dressing Coverlet Latex-Free Fabric Adhesive Dressings Discharge Instruction: 1.5 x 2 JAMAIR, CATO ([id number]) Secured With 50M Medipore H Soft Cloth Surgical Tape, 2x2 (in/yd) Discharge Instruction: Secure kerlix Compression Wrap Compression Stockings Add-Ons Electronic  Signature(s) Signed: 07/08/2021 11:56:23 AM By: Gretta Cool, BSN, RN, CWS, Kim RN, BSN Signed: 07/10/2021 4:05:40 PM By: [name] Previous Signature: 07/08/2021 11:01:31 AM Version By: [name] Entered By: Gretta Cool, BSN, RN, CWS, Kim on 07/08/2021 11:56:23

## 2021-07-10 NOTE — Progress Notes (Signed)
MERCURY, ROCK ([id number]) Visit Report for 07/08/2021 Physician Orders Details Patient Name: Chad [name], Chad [name] Date of Service: 07/08/2021 9:45 AM Medical Record Number: [id number] Patient Account Number: [id number] Date of Birth/Sex: 03-02-1961 (60 y.o. M) Treating RN: [name] Primary Care Provider: SYSTEM, PCP Other Clinician: Referring Provider: Clayborn Bigness Treating Provider/Extender: Skipper Cliche in Treatment: 4 Verbal / Phone Orders: No Diagnosis Coding Follow-up Appointments o Return Appointment in 1 week. o Nurse Visit as needed Bathing/ Shower/ Hygiene o May shower; gently cleanse wound with antibacterial soap, rinse and pat dry prior to dressing wounds Edema Control - Lymphedema / Segmental Compressive Device / Other o Elevate legs to the level of the heart and pump ankles as often as possible o Elevate leg(s) parallel to the floor when sitting. Non-Wound Condition o Additional non-wound orders/instructions: - patient needs toenails clipped asap Wound Treatment Wound #8 - Ankle Wound Laterality: Left, Medial Cleanser: Normal Saline 3 x Per Week/30 Days Discharge Instructions: Wash your hands with soap and water. Remove old dressing, discard into plastic bag and place into trash. Cleanse the wound with Normal Saline prior to applying a clean dressing using gauze sponges, not tissues or cotton balls. Do not scrub or use excessive force. Pat dry using gauze sponges, not tissue or cotton balls. Peri-Wound Care: Desitin Maximum Strength Ointment 4 (oz) 3 x Per Week/30 Days Discharge Instructions: Apply zinc periwound Primary Dressing: Hydrofera Blue Ready Transfer Foam, 2.5x2.5 (in/in) 3 x Per Week/30 Days Discharge Instructions: Apply Hydrofera Blue Ready to wound bed as directed (EXTRA SENT WITH PATIENT, PLEASE USE) Secondary Dressing: Kerlix 4.5 x 4.1 (in/yd) 3 x Per Week/30 Days Discharge Instructions: Apply kerlix over ABD pad Secured With: 41M  Medipore H Soft Cloth Surgical Tape, 2x2 (in/yd) 3 x Per Week/30 Days Discharge Instructions: Secure kerlix Wound #9 - Toe Second Wound Laterality: Right Cleanser: Normal Saline 3 x Per Week/30 Days Discharge Instructions: Wash your hands with soap and water. Remove old dressing, discard into plastic bag and place into trash. Cleanse the wound with Normal Saline prior to applying a clean dressing using gauze sponges, not tissues or cotton balls. Do not scrub or use excessive force. Pat dry using gauze sponges, not tissue or cotton balls. Peri-Wound Care: Desitin Maximum Strength Ointment 4 (oz) 3 x Per Week/30 Days Discharge Instructions: Apply zinc periwound Primary Dressing: Hydrofera Blue Ready Transfer Foam, 2.5x2.5 (in/in) 3 x Per Week/30 Days Discharge Instructions: Apply Hydrofera Blue Ready to wound bed as directed (EXTRA SENT WITH PATIENT, PLEASE USE) Secondary Dressing: Coverlet Latex-Free Fabric Adhesive Dressings 3 x Per Week/30 Days Discharge Instructions: 1.5 x 2 Secured With: 41M Medipore H Soft Cloth Surgical Tape, 2x2 (in/yd) 3 x Per Week/30 Days Discharge Instructions: Secure Chad [name], Chad [name] ([id number]) Electronic Signature(s) Signed: 07/08/2021 2:51:09 PM By: Gretta Cool, BSN, RN, CWS, Kim RN, BSN Signed: 07/09/2021 5:37:18 PM By: Worthy Keeler PA-C Previous Signature: 07/08/2021 11:01:31 AM Version By: [name] Entered By: Gretta Cool BSN, RN, CWS, Kim on 07/08/2021 11:56:59 Chad [name] ([id number]) -------------------------------------------------------------------------------- SuperBill Details Patient Name: Chad [name] Date of Service: 07/08/2021 Medical Record Number: [id number] Patient Account Number: [id number] Date of Birth/Sex: October 30, 1960 (60 y.o. M) Treating RN: [name] Primary Care Provider: SYSTEM, PCP Other Clinician: Cornell Barman Referring Provider: Clayborn Bigness Treating Provider/Extender: Skipper Cliche in Treatment: 46 Diagnosis  Coding ICD-10 Codes Code Description I87.2 Venous insufficiency (chronic) (peripheral) L97.322 Non-pressure chronic ulcer of left ankle with fat layer exposed 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, stage 4 I69.354 Hemiplegia  and hemiparesis following cerebral infarction affecting left non-dominant side Facility Procedures CPT4 Code: 63016010 Description: [phone] - WOUND CARE VISIT-LEV 2 EST PT Modifier: Quantity: 1 Electronic Signature(s) Signed: 07/08/2021 11:59:22 AM By: Gretta Cool, BSN, RN, CWS, Kim RN, BSN Signed: 07/09/2021 5:37:18 PM By: Worthy Keeler PA-C Previous Signature: 07/08/2021 10:31:06 AM Version By: [name] Entered By: Gretta Cool BSN, RN, CWS, Kim on 07/08/2021 11:59:22

## 2021-07-14 ENCOUNTER — Other Ambulatory Visit: Payer: Self-pay

## 2021-07-15 ENCOUNTER — Non-Acute Institutional Stay: Payer: Medicaid Other | Admitting: Nurse Practitioner

## 2021-07-15 ENCOUNTER — Other Ambulatory Visit: Payer: Self-pay

## 2021-07-15 ENCOUNTER — Encounter: Payer: Medicaid Other | Admitting: Physician Assistant

## 2021-07-15 ENCOUNTER — Encounter: Payer: Self-pay | Admitting: Nurse Practitioner

## 2021-07-15 VITALS — BP 110/60 | HR 74 | Temp 97.8°F | Resp 20 | Wt 160.4 lb

## 2021-07-15 DIAGNOSIS — I872 Venous insufficiency (chronic) (peripheral): Secondary | ICD-10-CM | POA: Diagnosis not present

## 2021-07-15 DIAGNOSIS — Z515 Encounter for palliative care: Secondary | ICD-10-CM

## 2021-07-15 DIAGNOSIS — G894 Chronic pain syndrome: Secondary | ICD-10-CM

## 2021-07-15 DIAGNOSIS — R630 Anorexia: Secondary | ICD-10-CM

## 2021-07-15 NOTE — Progress Notes (Addendum)
[place] Consult Note Telephone: [phone]  Fax: [phone]    Date of encounter: 07/15/21 10:05 PM PATIENT NAME: Chad [name] [id number]   [phone] (home)  DOB: 1960-09-08 MRN: [id number] PRIMARY CARE PROVIDER:    [place]:    Contact Information     Name Relation Home Work Mobile   Khayden, Herzberg   [phone]      I met face to face with patient in facility. Palliative Care was asked to follow this patient by consultation request of [place] to address advance care planning and complex medical decision making. This is a follow up visit.                                  ASSESSMENT AND PLAN / RECOMMENDATIONS: Symptom Management/Plan: 1. ACP: DNR; treat what is treatable including hospitalizations if necessary   2.Anorexia secondary to protein calorie malnutrition related to cancer improving ongoing. Current weight 160.4 lbs. Continue to monitor daily weights, supplements, supportive measures and courage to eat   3. Chronic pain reviewed, discussed and educated continue to monitor on pain scale, monitor efficacy vs adverse side effects.Will continue to monitor; encouraged to ask when in pain, Continue to follow by psychotherapy and psychiatry for counseling; supportive services. Continue with Oxycodone 47m q6 prn   4. Palliative care encounter; Palliative medicine team will continue to support patient, patient's family, and medical team. Visit consisted of counseling and education dealing with the complex and emotionally intense issues of symptom management and palliative care in the setting of serious and potentially life-threatening illness  Follow up Palliative Care Visit: Palliative care will continue to follow for complex medical decision making, advance care planning, and clarification of goals. Return 8  weeks or  prn.  I spent 62 minutes providing this consultation. More than 50% of the time in this consultation was spent in counseling and care coordination. PPS: 50%  Chief Complaint: Follow up palliative consult for complex medical decision making  HISTORY OF PRESENT ILLNESS:  RShyloh Krinkeis a 60y.o. year old male  with multiple medical problems including   late onset CVA, urothelial cancer, prostate cancer, chronic hepatitis C, hyperlipidemia, colon surgery, protein calorie malnutrition, anxiety, depression. [name]. [name] himself to w/c, requires assistance for ADL's. [name] himself with fair to declined appetite. Current weight 162.5 lbs with bmi 25.4 with stable weight. 05/28/2021 care plan meeting done,  no new changes. He continues to be followed by wound at the facility in addition to psychiatry with last visit 05/09/2021 for recurrent depression with mood disorder prescribed escitalopram and remeron with no changes. [name] to smoke daily. 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 No recent [name], infections, hospitalization. Saw Dr BRogue Bussingfor High-grade urothelial cancer/cytology; likely of the right renal pelvis /upper ureter, proceeded with keytruda infusion. IDA stable. Bilateral LE ulcers s/p PTCA seen at wound care today. Staff endorses no other changes or concerns, pain appears controlled. I visited and observed [name] he was sitting in the w/c. We talked about how he has been feeling. Mr. LVanduyneendorses  he is doing well, pain controlled, continues to be mobile. [name] endorses he continues to go to the smoking patio, declines smoking cessation, "one thing I  enjoy". We talked about sleep patterns, hygiene. We talked about appetite, nutrition. We talked about wounds. Medical goals reviewed. We talked about residing at facility, daily routine, quality of life. We talked about role pc in poc. Discussed f/u visit, no changes in goc today. Updated staff.   ROS Full 10 system review of systems performed and negative with exception of: as per HPI.   History obtained from review of EMR, discussion with Facility staff and Mr. Grieshaber.  I reviewed available labs, medications, imaging, studies and related documents from the EMR.  Records reviewed and summarized above.    Physical Exam: Constitutional: NAD General: frail appearing, debilitated pleasant male EYES: lids intact, ENMT:  oral mucous membranes moist CV: S1S2, RRR Pulmonary: LCTA, no increased work of breathing, no cough, room air Abdomen: normo-active BS + 4 quadrants, soft and non tender MSK: w/c dependent Skin: warm and dry Neuro:  +BLE generalized weakness,  no cognitive impairment Psych: non-anxious affect, A and O x 3  Thank you for the opportunity to participate in the care of [name].  The palliative care team will continue to follow. Please call our office at [phone] if we can be of additional assistance.   Questions and concerns were addressed. Provided general support and encouragement, no other unmet needs identified   This chart was dictated using voice recognition software.  Despite best efforts to proofread,  errors can occur which can change the documentation meaning.    Ihor Gully, NP

## 2021-07-15 NOTE — Progress Notes (Addendum)
TEAGAN, HEIDRICK ([id number]) Visit Report for 07/15/2021 Chief Complaint Document Details Patient Name: Chad [name], Chad [name] Date of Service: 07/15/2021 9:45 AM Medical Record Number: [id number] Patient Account Number: [id number] Date of Birth/Sex: 1961-07-18 (60 y.o. M) Treating RN: [name] Primary Care Provider: SYSTEM, PCP Other Clinician: Referring Provider: Clayborn Bigness Treating Provider/Extender: Skipper Cliche in Treatment: 47 Information Obtained from: Patient Chief Complaint Left ankle and right foot ulcers Electronic Signature(s) Signed: 07/15/2021 10:08:07 AM By: Worthy Keeler PA-C Entered By: Worthy Keeler on 07/15/2021 10:08:06 Chad [name] ([id number]) -------------------------------------------------------------------------------- Debridement Details Patient Name: Chad [name] Date of Service: 07/15/2021 9:45 AM Medical Record Number: [id number] Patient Account Number: [id number] Date of Birth/Sex: 1961-03-24 (60 y.o. M) Treating RN: [name] Primary Care Provider: SYSTEM, PCP Other Clinician: Referring Provider: Clayborn Bigness Treating Provider/Extender: Skipper Cliche in Treatment: 47 Debridement Performed for Wound #9 Right Toe Second Assessment: Performed By: Physician Tommie Sams., PA-C Debridement Type: Debridement Level of Consciousness (Pre- Awake and Alert procedure): Pre-procedure Verification/Time Out Yes - 10:12 Taken: Total Area Debrided (L x W): 0.2 (cm) x 0.8 (cm) = 0.16 (cm) Tissue and other material Viable, Non-Viable, Bone, Slough, Biofilm, Slough debrided: Level: Skin/Subcutaneous Tissue/Muscle/Bone Debridement Description: Excisional Instrument: Curette, Scissors Bleeding: Minimum Hemostasis Achieved: Pressure Response to Treatment: Procedure was tolerated well Level of Consciousness (Post- Awake and Alert procedure): Post Debridement Measurements of Total Wound Length: (cm) 0.2 Stage: Category/Stage  IV Width: (cm) 0.8 Depth: (cm) 0.2 Volume: (cm) 0.025 Character of Wound/Ulcer Post Debridement: Stable Post Procedure Diagnosis Same as Pre-procedure Notes right second toenail already mostly off at wound care appointment, removed completely with scissors. Electronic Signature(s) Signed: 07/15/2021 5:42:10 PM By: Worthy Keeler PA-C Signed: 07/17/2021 12:56:33 PM By: [name] Entered By: [name] on 07/15/2021 10:15:48 Chad [name] ([id number]) -------------------------------------------------------------------------------- HPI Details Patient Name: Chad [name] Date of Service: 07/15/2021 9:45 AM Medical Record Number: [id number] Patient Account Number: [id number] Date of Birth/Sex: 06/14/1961 (60 y.o. M) Treating RN: [name] Primary Care Provider: SYSTEM, PCP Other Clinician: Referring Provider: Clayborn Bigness Treating Provider/Extender: Skipper Cliche in Treatment: 47 History of Present Illness HPI Description: 10/08/18 on evaluation today patient actually presents to our office for initial evaluation concerning wounds that he has of the bilateral lower extremities. 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 He also has bilateral venous stasis. He apparently has been homeless currently following discharge from the hospital apparently he has been placed at [facility] which is is a skilled nursing facility locally. Nonetheless fortunately he does not show any signs of infection at this time which is good news. In fact several of the wound actually appears to be showing some signs of improvement already in my pinion. There are a couple areas in the left leg in particular there likely gonna require some sharp debridement to help clear away some necrotic tissue and help with more sufficient  healing. No fevers, chills, nausea, or vomiting noted at this time. 10/15/18 on evaluation today patient actually appears to be doing very well in regard to his bilateral lower extremities. He's been tolerating the dressing changes without complication. Fortunately there does not appear to be any evidence of active infection at this time which is great news. Overall I'm actually very pleased with how this has progressed in just one visits time. Readmission: 08/14/2020 upon evaluation today patient presents  for re-evaluation here in our clinic. He is having issues with his left ankle region as well as his right toe and his right heel. He tells me that the toe and heel actually began as a area that was itching that he was scratching and then subsequently opened up into wounds. These may have been abscess areas I presume based on what I am seeing currently. With regard to his left ankle region he tells me this was a similar type occurrence although he does have venous stasis this very well may be more of a venous leg ulcer more than anything. Nonetheless I do believe that the patient would benefit from appropriate and aggressive wound care to try to help get things under better control here. He does have history of a stroke on the left side affecting him to some degree there that he is able to stand although he does have some residual weakness. Otherwise again the patient does have chronic venous insufficiency as previously noted. His arterial studies most recently obtained showed that he had an ABI on the right of 1.16 with a TBI of 0.52 and on the left and ABI of 1.14 with a TBI of 0.81. That was obtained on 06/19/2020. 08/28/2020 upon evaluation today patient appears to be doing decently well in regard to his wounds in general. He has been tolerating the dressing changes without complication. Fortunately there does not appear to be any signs of active infection which is great news. With that being said  I think the [facility] is doing a good job I would recommend that we likely continue with that currently. 09/11/2020 upon evaluation today patient's wounds did not appear to be doing too poorly but again he is not really showing signs of significant improvement with regard to any of the wounds on the right. None of them have Hydrofera Blue on them I am not exactly sure why this is not being followed as the facility did not contact us to let us know of any issues with obtaining dressings or otherwise. With that being said he is supposed to be using Hydrofera Blue on both of the wounds on the right foot as well as the ankle wound on the left side. 09/18/2020 upon evaluation today patient appears to be doing poorly with regard to his wounds. Again right now the left ankle in particular showed signs of extreme maceration. Apparently he was told by someone with staff at [facility] they could not get the [facility]. With that being said this is something that is never been relayed to [place] one way or another. Also the patient subsequently has not supposed to have a border gauze dressing on. He should have an ABD pad and roll gauze to secure as this drains much too much just to have a border gauze dressing to cover. Nonetheless the fact that they are not using the appropriate dressing is directly causing deterioration of the left ankle wound it is significantly worse today compared to what it was previous. I did attempt to call [facility] while the patient was here I called three times and got no one to even pick up the phone. After this I had my for an office coordinator call and she was able to finally get through and leave a message with the D ON as of dictation of this note which is roughly about an hour and a half later I still have not been able to speak with anyone at the facility. 09/25/2020 upon  evaluation today patient actually showing signs of good improvement which is excellent  news. He has been tolerating the dressing changes without complication. Fortunately there is no signs of active infection which is great news. No fevers, chills, nausea, vomiting, or diarrhea. I do feel like the facility has been doing a much better job at taking care of him as far as the dressings are concerned. However the director of nursing never did call me back. 10/09/2020 upon evaluation today patient appears to be doing well with regard to his wound. The toe ulcer did require some debridement but the other 2 areas actually appear to be doing quite well. 10/19/2020 upon evaluation today patient actually appears to be doing very well in regard to his wounds. In fact the heel does appear to be completely healed. The toe is doing better in the medial ankle on the left is also doing better. Overall I think he is headed in the right direction. 10/26/2020 upon evaluation today patient appears to be doing well with regard to his wound. He is showing signs of improvement which is great news and overall I am very pleased with where things stand today. No fevers, chills, nausea, vomiting, or diarrhea. 11/02/2020 upon evaluation today patient appears to be doing well with regard to his wounds. He has been tolerating the dressing changes without complication overall I am extremely pleased with where things stand today. He in regard to the toe is almost completely healed and the medial ankle on the left is doing much better. 11/09/2020 upon evaluation today patient appears to be doing a little poorly in regard to his left medial ankle ulcer. Fortunately there does not appear to be any signs of systemic infection but unfortunately locally he does appear to be infected in fact he has blue-green drainage consistent with Pseudomonas. Chad [name], Chad [name] ([id number]) 11/16/2020 upon evaluation today patient appears to be doing well with regard to his wound. It actually appears to be doing better. I did place him on  gentamicin cream since the Cipro was actually resistant even though he was positive for Pseudomonas on culture. Overall I think that he does seem to be doing better though I am unsure whether or not they have actually been putting the cream on. The patient is not sure that we did talk to the nurse directly and she was going to initiate that treatment. Fortunately there does not appear to be any signs of active infection at this time. No fevers, chills, nausea, vomiting, or diarrhea. 4/28; the area on the right second toe is close to healed. Left medial ankle required debridement 12/07/2020 upon evaluation today patient appears to be doing well with regard to his wounds. In fact the right second toe appears to be completely healed which is great news. Fortunately there does not appear to be any signs of active infection at this time which is also great news. I think we can probably discontinue the gentamicin on top of everything else. 12/14/2020 upon evaluation today patient appears to be doing well with regard to his wound. He is making good progress and overall very pleased with where things stand today. There is no signs of active infection at this time which is great news. 12/28/2020 upon evaluation today patient appears to be doing well with regard to his wounds. He has been tolerating the dressing changes without complication. Fortunately there is no signs of active infection at this time. No fevers, chills, nausea, vomiting, or diarrhea. 12/28/2020 upon evaluation today patient's wound  bed actually showed signs of excellent improvement. He has great epithelization and granulation I do not see any signs of infection overall I am extremely pleased with where things stand at this point. No fevers, chills, nausea, vomiting, or diarrhea. 01/11/2021 upon evaluation today patient appears to be doing well with regard to his wound on his leg. He has been tolerating the dressing changes without complication.  Fortunately there does not appear to be any signs of active infection which is great news. No fevers, chills, nausea, vomiting, or diarrhea. 01/25/2021 upon evaluation today patient appears to be doing well with regard to his wound. He has been tolerating the dressing changes without complication. Fortunately the collagen seems to be doing a great job which is excellent news. No fevers, chills, nausea, vomiting, or diarrhea. 02/08/2021 upon evaluation today patient's wound is actually looking a little bit worse especially in the periwound compared to previous. Fortunately there does not appear to be any signs of infection which is great news with that being said he does have some irritation around the periphery of the wound which has me more concerned. He actually had a dressing on that had not been changed in 3 days. He also is supposed to have daily dressing changes. With regard to the dressing applied he had a silver alginate dressing and silver collagen is what is recommended and ordered. He also had no Desitin around the edges of the wound in the periwound region although that is on the order inspect to be done as well. In general I was very concerned I did contact [facility] actually spoke with Magda Paganini who is the scheduling individual and subsequently she stated that she would pass the information to the D ON apparently the D ON was not available to talk to me when I call today. 02/18/2021 upon evaluation today patient's wound is actually showing signs of improvement. Fortunately there does not appear to be any evidence of infection which is great news overall I am extremely pleased with where things stand today. No fevers, chills, nausea, vomiting, or diarrhea. 8/3; patient presents for 1 week follow-up. He has no issues or complaints today. He denies signs of infection. 03/11/2021 upon evaluation today patient appears to be doing well with regard to his wound. He does have a little bit of  slough noted on the surface of the wound but fortunately there does not appear to be any signs of active infection at this time. No fevers, chills, nausea, vomiting, or diarrhea. 03/18/2021 upon evaluation today patient appears to be doing well with regard to his wound. He has been tolerating the dressing changes without complication. There was a little irritation more proximal to where the wound was that was not noted last week but nonetheless this is very superficial just seems to be more irritation we just need to make sure to put a good amount of the zinc over the area in my opinion. Otherwise he does not seem to be doing significantly worse at all which is great news. 03/25/2021 upon evaluation today patient appears to be doing well with regard to his wound. He is going require some sharp debridement today to clear with some of the necrotic debris. I did perform this today without complication postdebridement wound bed appears to be doing much better this is great news. 04/08/2021 upon evaluation today patient appears to be doing decently well in regard to his wound although the overall measurement is not significantly smaller compared to previous. It is gone down  a little bit but still the facility continues to not really put the appropriate dressings in place in fact he was supposed to have collagen we think he probably had more of an allergy to At this point. Fortunately there does not appear to be any signs of active infection systemically though locally I do not see anything on initial visualization either as far as erythema or warmth. 04/15/2021 upon evaluation today patient appears to be doing well with regard to his wound. He is actually showing signs of improvement. I did place him on antibiotics last week, Cipro. He has been taking that 2 times a day and seems to be tolerating it very well. I do not see any evidence of worsening and in fact the overall appearance of the wound is smaller  today which is also great news. 9/26; left medial ankle chronic venous insufficiency wound is improved. Using Hydrofera Blue 10/10; left medial ankle chronic venous insufficiency. Wound has not changed much in appearance completely nonviable surface. Apparently there have been problems getting the right product on the wound at the facility although he came in with [facility] on today 05/14/2021 upon evaluation today patient appears to be doing well with regard to his wound. I think he is making progress here which is good news. Fortunately there does not appear to be any signs of active infection at this time. No fevers, chills, nausea, vomiting, or diarrhea. 05/20/2021 upon evaluation today patient appears to be doing well with regard to his wound. He is showing signs of good improvement which is great news. There does not appear to be any evidence of active infection which is also excellent news. No fevers, chills, nausea, vomiting, or diarrhea. 05/28/2021 upon evaluation today patient appears to be doing quite well. There does not appear to be any signs of active infection at this time which is great news. Overall I am extremely pleased with where things stand today. I think he is headed in the right direction. 06/11/2021 upon evaluation today patient appears to be doing well with regard to his left ankle ulcer and poorly in regard to the toe ulcer on the second toe right foot. This appears to show signs of joint exposure. Apparently this has been present for 1 to 2 months although he kept [facility], Chad [name] ([id number]) forgetting to tell me about it. That is unfortunate as right now it definitely appears to be doing significantly worse than what I would like to see. There does not appear to be any signs of active infection systemically though locally I am concerned about the possibility of infection the toe is quite red. Again no one from the facility ever contacted [place] to advise that this was  going on in the interim either. 06/17/2021 upon evaluation today patient presents for follow-up I did review his x-ray which showed a navicular bone fracture I am unsure of the chronicity of this. Subsequently he also had osteomyelitis of the toe which was what I was more concerned about this did not show up on x-ray but did show up on the pathology scrapings. This was listed as acute osteomyelitis. Nonetheless at this point I think that the antibiotic treatment is the best regimen to go with currently. The patient is in agreement with that plan. Nonetheless he has initially 30 days of doxycycline off likely extend that towards the end of the treatment cycle that will be around the middle of December for an additional 2 weeks. That all depends on how well he  continues to heal. Nonetheless based on what I am seeing in the foot I did want a proceed with an MRI as well which I think will be helpful to identify if there is anything else that needs to be addressed from the standpoint of infection. 06/24/2021 upon evaluation today patient appears to be doing pretty well in regards to his wounds. I think both are actually showing signs of improvement which is good I did review his MRI today which did show signs of osteomyelitis of the middle and proximal phalanx on his right foot of the affected toe. With that being said this is actually showing signs of significant improvement today already with the antibiotic therapy I think the redness is also improved. Overall I think that we just need to give this some time with appropriate wound care we will see how things go potentially hyperbarics could be considered. 07/02/2021 upon inspection today patient actually appears to be doing well in regard to his left ankle which is getting very close to complete resolution of pleased in that regard. Unfortunately he is continuing to have issues with his second toe right foot and this seems to still be very painful for  him. Recommend he try something different from the standpoint of antibiotics. 07/15/2021 upon evaluation today patient appears to be doing actually pretty well in regard to his foot. This is actually showing signs of significant improvement which is great news. Overall I feel like the patient is improving both in regard to the second toe as well as the ankle on the left. With that being said the biggest issue that I do see currently is that he is needing to have a refill of the doxycycline that we previously treated him with. He also did see podiatry they are not going to recommend any amputation at this point since he seems to be doing quite well. For that reason we just need to keep things under control from an infection standpoint. Electronic Signature(s) Signed: 07/15/2021 10:22:35 AM By: Worthy Keeler PA-C Entered By: Worthy Keeler on 07/15/2021 10:22:34 Chad [name] ([id number]) -------------------------------------------------------------------------------- Physical Exam Details Patient Name: Chad [name] Date of Service: 07/15/2021 9:45 AM Medical Record Number: [id number] Patient Account Number: [id number] Date of Birth/Sex: 10-19-1960 (60 y.o. M) Treating RN: [name] Primary Care Provider: SYSTEM, PCP Other Clinician: Referring Provider: Clayborn Bigness Treating Provider/Extender: Skipper Cliche in Treatment: 65 Constitutional Well-nourished and well-hydrated in no acute distress. Respiratory normal breathing without difficulty. Psychiatric this patient is able to make decisions and demonstrates good insight into disease process. Alert and Oriented x 3. pleasant and cooperative. Notes Upon inspection patient's wound bed showed signs of good granulation and epithelization at this point. I did actively perform some debridement in regard to the right second toe. This actually included some bone debridement as well today and he tolerated that without  complication. Electronic Signature(s) Signed: 07/15/2021 10:23:01 AM By: Worthy Keeler PA-C Entered By: Worthy Keeler on 07/15/2021 10:23:00 Chad [name] ([id number]) -------------------------------------------------------------------------------- Physician Orders Details Patient Name: Chad [name] Date of Service: 07/15/2021 9:45 AM Medical Record Number: [id number] Patient Account Number: [id number] Date of Birth/Sex: [birth year] (60 y.o. 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 o Nurse Visit as needed Bathing/ Shower/ Hygiene o May shower; gently cleanse wound with antibacterial soap, rinse and pat dry prior to dressing wounds Edema Control - Lymphedema / Segmental Compressive Device / Other o Elevate legs to the level of the heart and pump ankles as often as possible o Elevate leg(s) parallel to the floor when sitting. Non-Wound Condition o Additional non-wound orders/instructions: - patient needs toenails clipped asap Wound Treatment Wound #10 - Toe Second Wound Laterality: Right, Distal Cleanser: Normal Saline Discharge Instructions: Wash your hands with soap and water. Remove old dressing, discard into plastic bag and place into trash. Cleanse the wound with Normal Saline prior to applying a clean dressing using gauze sponges, not tissues or cotton balls. Do not scrub or use excessive force. Pat dry using gauze sponges, not  tissue or cotton balls. Primary Dressing: Xeroform-HBD 2x2 (in/in) Discharge Instructions: Apply Xeroform-HBD 2x2 (in/in) as directed Secondary Dressing: Gauze Discharge Instructions: As directed: dry, moistened with saline or moistened with Dakins Solution Secured With: 8M Medipore H Soft Cloth Surgical Tape, 2x2 (in/yd) Wound #8 - Ankle Wound Laterality: Left, Medial Cleanser: Normal Saline 3 x Per Week/30 Days Discharge Instructions: Wash your hands with soap and water. Remove old dressing, discard into plastic bag and place into trash. Cleanse the wound with Normal Saline prior to applying a clean dressing using gauze sponges, not tissues or cotton balls. Do not scrub or use excessive force. Pat dry using gauze sponges, not tissue or cotton balls. Peri-Wound Care: Desitin Maximum Strength Ointment 4 (oz) 3 x Per Week/30 Days Discharge Instructions: Apply zinc periwound Primary Dressing: Hydrofera Blue Ready Transfer Foam, 2.5x2.5 (in/in) 3 x Per Week/30 Days Discharge Instructions: Apply Hydrofera Blue Ready to wound bed as directed (EXTRA SENT WITH PATIENT, PLEASE USE) Secondary Dressing: Kerlix 4.5 x 4.1 (in/yd) 3 x Per Week/30 Days Discharge Instructions: Apply kerlix over ABD pad Secured With: 8M Medipore H Soft Cloth Surgical Tape, 2x2 (in/yd) 3 x Per Week/30 Days Discharge Instructions: Secure DONIS, PINDER ([id number]) Wound #9 - Toe Second Wound Laterality: Right Cleanser: Normal Saline 3 x Per Week/30 Days Discharge Instructions: Wash your hands with soap and water. Remove old dressing, discard into plastic bag and place into trash. Cleanse the wound with Normal Saline prior to applying a clean dressing using gauze sponges, not tissues or cotton balls. Do not scrub or use excessive force. Pat dry using gauze sponges, not tissue or cotton balls. Primary Dressing: Xeroform-HBD 2x2 (in/in) 3 x Per Week/30 Days Discharge Instructions: Apply Xeroform-HBD 2x2 (in/in) as  directed Secondary Dressing: Gauze 3 x Per Week/30 Days Discharge Instructions: As directed: dry, moistened with saline or moistened with Dakins Solution Secured With: 8M Medipore H Soft Cloth Surgical Tape, 2x2 (in/yd) 3 x Per Week/30 Days Discharge Instructions: Secure kerlix Electronic Signature(s) Signed: 07/15/2021 5:42:10 PM By: Worthy Keeler PA-C Signed: 07/17/2021 12:56:33 PM By: [name] Entered By: [name] on 07/15/2021 10:36:37 Chad [name] ([id number]) -------------------------------------------------------------------------------- Problem List Details Patient Name: Chad [name] Date of Service: 07/15/2021 9:45 AM Medical Record Number: [id number] Patient Account Number: [id number] Date of Birth/Sex: [birth year] (60 y.o. 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 08/14/2020 No Yes non-dominant side Inactive Problems ICD-10 Code Description Active Date Inactive Date L97.412 Non-pressure chronic ulcer of right heel and midfoot with fat layer exposed 08/14/2020 08/14/2020 L97.512 Non-pressure chronic ulcer of other part of right foot with fat layer exposed 08/14/2020 08/14/2020 Resolved Problems Electronic Signature(s) Signed: 07/15/2021 10:07:50 AM By: Worthy Keeler PA-C Entered By: Worthy Keeler on 07/15/2021 10:07:50 Chad [name] ([id number]) -------------------------------------------------------------------------------- Progress Note  Details Patient Name: Chad [name] Date of Service: 07/15/2021 9:45 AM Medical Record Number: [id number] Patient Account Number: [id number] Date of Birth/Sex: 10-Feb-1961 (60 y.o. M) Treating RN: [name] Primary Care Provider: SYSTEM, PCP Other Clinician: Referring Provider: Clayborn Bigness Treating Provider/Extender: Skipper Cliche in Treatment: 47 Subjective Chief Complaint Information obtained from Patient Left ankle and right foot ulcers History of Present Illness (HPI) 10/08/18 on evaluation today patient actually presents to our office for initial evaluation concerning wounds that he has of the bilateral lower extremities. 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 He also has bilateral venous stasis. He apparently has been homeless currently following discharge from the hospital apparently he has been placed at [facility] which is is a skilled nursing facility locally. Nonetheless fortunately he does not show any signs of infection at this time which is good news. In fact several of the wound actually appears to be showing some signs of improvement already in my pinion. There are a couple areas in the left leg in particular there likely gonna require some sharp debridement to help clear away some necrotic tissue and help with more sufficient healing. No fevers, chills, nausea, or vomiting noted at this time. 10/15/18 on evaluation today patient actually appears to be doing very well in regard to his bilateral lower extremities. He's been tolerating the dressing changes without complication. Fortunately there does not appear to be any evidence of active infection at this time which is great news. Overall I'm actually very pleased with how this has progressed in just one visits time. Readmission: 08/14/2020 upon  evaluation today patient presents for re-evaluation here in our clinic. He is having issues with his left ankle region as well as his right toe and his right heel. He tells me that the toe and heel actually began as a area that was itching that he was scratching and then subsequently opened up into wounds. These may have been abscess areas I presume based on what I am seeing currently. With regard to his left ankle region he tells me this was a similar type occurrence although he does have venous stasis this very well may be more of a venous leg ulcer more than anything. Nonetheless I do believe that the patient would benefit from appropriate and aggressive wound care to try to help get things under better control here. He does have history of a stroke on the left side affecting him to some degree there that he is able to stand although he does have some residual weakness. Otherwise again the patient does have chronic venous insufficiency as previously noted. His arterial studies most recently obtained showed that he had an ABI on the right of 1.16 with a TBI of 0.52 and  on the left and ABI of 1.14 with a TBI of 0.81. That was obtained on 06/19/2020. 08/28/2020 upon evaluation today patient appears to be doing decently well in regard to his wounds in general. He has been tolerating the dressing changes without complication. Fortunately there does not appear to be any signs of active infection which is great news. With that being said I think the [facility] is doing a good job I would recommend that we likely continue with that currently. 09/11/2020 upon evaluation today patient's wounds did not appear to be doing too poorly but again he is not really showing signs of significant improvement with regard to any of the wounds on the right. None of them have Hydrofera Blue on them I am not exactly sure why this is not being followed as the facility did not contact us to let us know of any issues with  obtaining dressings or otherwise. With that being said he is supposed to be using Hydrofera Blue on both of the wounds on the right foot as well as the ankle wound on the left side. 09/18/2020 upon evaluation today patient appears to be doing poorly with regard to his wounds. Again right now the left ankle in particular showed signs of extreme maceration. Apparently he was told by someone with staff at [facility] they could not get the [facility]. With that being said this is something that is never been relayed to [place] one way or another. Also the patient subsequently has not supposed to have a border gauze dressing on. He should have an ABD pad and roll gauze to secure as this drains much too much just to have a border gauze dressing to cover. Nonetheless the fact that they are not using the appropriate dressing is directly causing deterioration of the left ankle wound it is significantly worse today compared to what it was previous. I did attempt to call [facility] while the patient was here I called three times and got no one to even pick up the phone. After this I had my for an office coordinator call and she was able to finally get through and leave a message with the D ON as of dictation of this note which is roughly about an hour and a half later I still have not been able to speak with anyone at the facility. 09/25/2020 upon evaluation today patient actually showing signs of good improvement which is excellent news. He has been tolerating the dressing changes without complication. Fortunately there is no signs of active infection which is great news. No fevers, chills, nausea, vomiting, or diarrhea. I do feel like the facility has been doing a much better job at taking care of him as far as the dressings are concerned. However the director of nursing never did call me back. 10/09/2020 upon evaluation today patient appears to be doing well with regard to his wound. The toe  ulcer did require some debridement but the other 2 areas actually appear to be doing quite well. 10/19/2020 upon evaluation today patient actually appears to be doing very well in regard to his wounds. In fact the heel does appear to be completely healed. The toe is doing better in the medial ankle on the left is also doing better. Overall I think he is headed in the right direction. 10/26/2020 upon evaluation today patient appears to be doing well with regard to his wound. He is showing signs of improvement which is great news and overall I am  very pleased with where things stand today. No fevers, chills, nausea, vomiting, or diarrhea. 11/02/2020 upon evaluation today patient appears to be doing well with regard to his wounds. He has been tolerating the dressing changes without complication overall I am extremely pleased with where things stand today. He in regard to the toe is almost completely healed and the medial [facility], Chad [name] ([id number]) ankle on the left is doing much better. 11/09/2020 upon evaluation today patient appears to be doing a little poorly in regard to his left medial ankle ulcer. Fortunately there does not appear to be any signs of systemic infection but unfortunately locally he does appear to be infected in fact he has blue-green drainage consistent with Pseudomonas. 11/16/2020 upon evaluation today patient appears to be doing well with regard to his wound. It actually appears to be doing better. I did place him on gentamicin cream since the Cipro was actually resistant even though he was positive for Pseudomonas on culture. Overall I think that he does seem to be doing better though I am unsure whether or not they have actually been putting the cream on. The patient is not sure that we did talk to the nurse directly and she was going to initiate that treatment. Fortunately there does not appear to be any signs of active infection at this time. No fevers, chills, nausea, vomiting,  or diarrhea. 4/28; the area on the right second toe is close to healed. Left medial ankle required debridement 12/07/2020 upon evaluation today patient appears to be doing well with regard to his wounds. In fact the right second toe appears to be completely healed which is great news. Fortunately there does not appear to be any signs of active infection at this time which is also great news. I think we can probably discontinue the gentamicin on top of everything else. 12/14/2020 upon evaluation today patient appears to be doing well with regard to his wound. He is making good progress and overall very pleased with where things stand today. There is no signs of active infection at this time which is great news. 12/28/2020 upon evaluation today patient appears to be doing well with regard to his wounds. He has been tolerating the dressing changes without complication. Fortunately there is no signs of active infection at this time. No fevers, chills, nausea, vomiting, or diarrhea. 12/28/2020 upon evaluation today patient's wound bed actually showed signs of excellent improvement. He has great epithelization and granulation I do not see any signs of infection overall I am extremely pleased with where things stand at this point. No fevers, chills, nausea, vomiting, or diarrhea. 01/11/2021 upon evaluation today patient appears to be doing well with regard to his wound on his leg. He has been tolerating the dressing changes without complication. Fortunately there does not appear to be any signs of active infection which is great news. No fevers, chills, nausea, vomiting, or diarrhea. 01/25/2021 upon evaluation today patient appears to be doing well with regard to his wound. He has been tolerating the dressing changes without complication. Fortunately the collagen seems to be doing a great job which is excellent news. No fevers, chills, nausea, vomiting, or diarrhea. 02/08/2021 upon evaluation today patient's wound  is actually looking a little bit worse especially in the periwound compared to previous. Fortunately there does not appear to be any signs of infection which is great news with that being said he does have some irritation around the periphery of the wound which has me more concerned. He actually  had a dressing on that had not been changed in 3 days. He also is supposed to have daily dressing changes. With regard to the dressing applied he had a silver alginate dressing and silver collagen is what is recommended and ordered. He also had no Desitin around the edges of the wound in the periwound region although that is on the order inspect to be done as well. In general I was very concerned I did contact [facility] actually spoke with Magda Paganini who is the scheduling individual and subsequently she stated that she would pass the information to the D ON apparently the D ON was not available to talk to me when I call today. 02/18/2021 upon evaluation today patient's wound is actually showing signs of improvement. Fortunately there does not appear to be any evidence of infection which is great news overall I am extremely pleased with where things stand today. No fevers, chills, nausea, vomiting, or diarrhea. 8/3; patient presents for 1 week follow-up. He has no issues or complaints today. He denies signs of infection. 03/11/2021 upon evaluation today patient appears to be doing well with regard to his wound. He does have a little bit of slough noted on the surface of the wound but fortunately there does not appear to be any signs of active infection at this time. No fevers, chills, nausea, vomiting, or diarrhea. 03/18/2021 upon evaluation today patient appears to be doing well with regard to his wound. He has been tolerating the dressing changes without complication. There was a little irritation more proximal to where the wound was that was not noted last week but nonetheless this is very superficial  just seems to be more irritation we just need to make sure to put a good amount of the zinc over the area in my opinion. Otherwise he does not seem to be doing significantly worse at all which is great news. 03/25/2021 upon evaluation today patient appears to be doing well with regard to his wound. He is going require some sharp debridement today to clear with some of the necrotic debris. I did perform this today without complication postdebridement wound bed appears to be doing much better this is great news. 04/08/2021 upon evaluation today patient appears to be doing decently well in regard to his wound although the overall measurement is not significantly smaller compared to previous. It is gone down a little bit but still the facility continues to not really put the appropriate dressings in place in fact he was supposed to have collagen we think he probably had more of an allergy to At this point. Fortunately there does not appear to be any signs of active infection systemically though locally I do not see anything on initial visualization either as far as erythema or warmth. 04/15/2021 upon evaluation today patient appears to be doing well with regard to his wound. He is actually showing signs of improvement. I did place him on antibiotics last week, Cipro. He has been taking that 2 times a day and seems to be tolerating it very well. I do not see any evidence of worsening and in fact the overall appearance of the wound is smaller today which is also great news. 9/26; left medial ankle chronic venous insufficiency wound is improved. Using Hydrofera Blue 10/10; left medial ankle chronic venous insufficiency. Wound has not changed much in appearance completely nonviable surface. Apparently there have been problems getting the right product on the wound at the facility although he came in with [facility]  on today 05/14/2021 upon evaluation today patient appears to be doing well with regard to his  wound. I think he is making progress here which is good news. Fortunately there does not appear to be any signs of active infection at this time. No fevers, chills, nausea, vomiting, or diarrhea. 05/20/2021 upon evaluation today patient appears to be doing well with regard to his wound. He is showing signs of good improvement which is great news. There does not appear to be any evidence of active infection which is also excellent news. No fevers, chills, nausea, vomiting, or diarrhea. Chad [name], Chad [name] ([id number]) 05/28/2021 upon evaluation today patient appears to be doing quite well. There does not appear to be any signs of active infection at this time which is great news. Overall I am extremely pleased with where things stand today. I think he is headed in the right direction. 06/11/2021 upon evaluation today patient appears to be doing well with regard to his left ankle ulcer and poorly in regard to the toe ulcer on the second toe right foot. This appears to show signs of joint exposure. Apparently this has been present for 1 to 2 months although he kept forgetting to tell me about it. That is unfortunate as right now it definitely appears to be doing significantly worse than what I would like to see. There does not appear to be any signs of active infection systemically though locally I am concerned about the possibility of infection the toe is quite red. Again no one from the facility ever contacted [place] to advise that this was going on in the interim either. 06/17/2021 upon evaluation today patient presents for follow-up I did review his x-ray which showed a navicular bone fracture I am unsure of the chronicity of this. Subsequently he also had osteomyelitis of the toe which was what I was more concerned about this did not show up on x-ray but did show up on the pathology scrapings. This was listed as acute osteomyelitis. Nonetheless at this point I think that the antibiotic treatment is the best  regimen to go with currently. The patient is in agreement with that plan. Nonetheless he has initially 30 days of doxycycline off likely extend that towards the end of the treatment cycle that will be around the middle of December for an additional 2 weeks. That all depends on how well he continues to heal. Nonetheless based on what I am seeing in the foot I did want a proceed with an MRI as well which I think will be helpful to identify if there is anything else that needs to be addressed from the standpoint of infection. 06/24/2021 upon evaluation today patient appears to be doing pretty well in regards to his wounds. I think both are actually showing signs of improvement which is good I did review his MRI today which did show signs of osteomyelitis of the middle and proximal phalanx on his right foot of the affected toe. With that being said this is actually showing signs of significant improvement today already with the antibiotic therapy I think the redness is also improved. Overall I think that we just need to give this some time with appropriate wound care we will see how things go potentially hyperbarics could be considered. 07/02/2021 upon inspection today patient actually appears to be doing well in regard to his left ankle which is getting very close to complete resolution of pleased in that regard. Unfortunately he is continuing to have issues with his second toe  right foot and this seems to still be very painful for him. Recommend he try something different from the standpoint of antibiotics. 07/15/2021 upon evaluation today patient appears to be doing actually pretty well in regard to his foot. This is actually showing signs of significant improvement which is great news. Overall I feel like the patient is improving both in regard to the second toe as well as the ankle on the left. With that being said the biggest issue that I do see currently is that he is needing to have a refill of the  doxycycline that we previously treated him with. He also did see podiatry they are not going to recommend any amputation at this point since he seems to be doing quite well. For that reason we just need to keep things under control from an infection standpoint. Objective Constitutional Well-nourished and well-hydrated in no acute distress. Vitals Time Taken: 9:24 AM, Height: 67 in, Weight: 160 lbs, BMI: 25.1, Temperature: 98.3 F, Pulse: 90 bpm, Respiratory Rate: 18 breaths/min, Blood Pressure: 114/79 mmHg. Respiratory normal breathing without difficulty. Psychiatric this patient is able to make decisions and demonstrates good insight into disease process. Alert and Oriented x 3. pleasant and cooperative. General Notes: Upon inspection patient's wound bed showed signs of good granulation and epithelization at this point. I did actively perform some debridement in regard to the right second toe. This actually included some bone debridement as well today and he tolerated that without complication. Integumentary (Hair, Skin) Wound #10 status is Open. Original cause of wound was Trauma. The date acquired was: 07/15/2021. The wound is located on the Right,Distal Toe Second. The wound measures 1cm length x 1cm width x 0.1cm depth; 0.785cm^2 area and 0.079cm^3 volume. There is Fat Layer (Subcutaneous Tissue) exposed. There is a small amount of sanguinous drainage noted. There is large (67-100%) red granulation within the wound bed. There is no necrotic tissue within the wound bed. Wound #8 status is Open. Original cause of wound was Gradually Appeared. The date acquired was: 07/12/2019. The wound has been in treatment 47 weeks. The wound is located on the Left,Medial Ankle. The wound measures 1.3cm length x 0.5cm width x 0.1cm depth; 0.511cm^2 area and 0.051cm^3 volume. There is Fat Layer (Subcutaneous Tissue) exposed. There is a medium amount of serosanguineous drainage noted. The wound margin is  flat and intact. There is large (67-100%) pink, pale granulation within the wound bed. There is a small (1-33%) amount of necrotic tissue within the wound bed including Adherent Slough. Wound #9 status is Open. Original cause of wound was Gradually Appeared. The date acquired was: 05/31/2021. The wound has been in treatment 4 weeks. The wound is located on the Right Toe Second. The wound measures 0.2cm length x 0.8cm width x 0.2cm depth; 0.126cm^2 area and 0.025cm^3 volume. There is bone, joint, tendon, and Fat Layer (Subcutaneous Tissue) exposed. There is a medium amount of serosanguineous drainage noted. The wound margin is flat and intact. There is small (1-33%) pink granulation within the wound bed. There is a large (67-100%) amount of necrotic tissue within the wound bed including Adherent Slough. 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 There was a Excisional Skin/Subcutaneous Tissue/Muscle/Bone Debridement with  a total area of 0.16 sq cm performed by Tommie Sams., PA-C. With the following instrument(s): Curette, and Scissors to remove Viable and Non-Viable tissue/material. Material removed includes Bone,Slough, and Biofilm. A time out was conducted at 10:12, prior to the start of the procedure. A Minimum amount of bleeding was controlled with Pressure. The procedure was tolerated well. Post Debridement Measurements: 0.2cm length x 0.8cm width x 0.2cm depth; 0.025cm^3 volume. Post debridement Stage noted as Category/Stage IV. Character of Wound/Ulcer Post Debridement is stable. Post procedure Diagnosis Wound #9: Same as  Pre-Procedure General Notes: right second toenail already mostly off at wound care appointment, removed completely with scissors.. Plan Follow-up Appointments: Return Appointment in 1 week. Nurse Visit as needed Bathing/ Shower/ Hygiene: May shower; gently cleanse wound with antibacterial soap, rinse and pat dry prior to dressing wounds Edema Control - Lymphedema / Segmental Compressive Device / Other: Elevate legs to the level of the heart and pump ankles as often as possible Elevate leg(s) parallel to the floor when sitting. Non-Wound Condition: Additional non-wound orders/instructions: - patient needs toenails clipped asap WOUND #10: - Toe Second Wound Laterality: Right, Distal Cleanser: Normal Saline Discharge Instructions: Wash your hands with soap and water. Remove old dressing, discard into plastic bag and place into trash. Cleanse the wound with Normal Saline prior to applying a clean dressing using gauze sponges, not tissues or cotton balls. Do not scrub or use excessive force. Pat dry using gauze sponges, not tissue or cotton balls. Primary Dressing: Xeroform-HBD 2x2 (in/in) Discharge Instructions: Apply Xeroform-HBD 2x2 (in/in) as directed Secondary Dressing: Gauze Discharge Instructions: As directed: dry, moistened with saline or moistened with Dakins Solution Secured With: 59M Medipore H Soft Cloth Surgical Tape, 2x2 (in/yd) WOUND #8: - Ankle Wound Laterality: Left, Medial Cleanser: Normal Saline 3 x Per Week/30 Days Discharge Instructions: Wash your hands with soap and water. Remove old dressing, discard into plastic bag and place into trash. Cleanse the wound with Normal Saline prior to applying a clean dressing using gauze sponges, not tissues or cotton balls. Do not scrub or use excessive force. Pat dry using gauze sponges, not tissue or cotton balls. Peri-Wound Care: Desitin Maximum Strength Ointment 4 (oz) 3 x Per Week/30 Days Discharge Instructions: Apply zinc  periwound Primary Dressing: Hydrofera Blue Ready Transfer Foam, 2.5x2.5 (in/in) 3 x Per Week/30 Days Discharge Instructions: Apply Hydrofera Blue Ready to wound bed as directed (EXTRA SENT WITH PATIENT, PLEASE USE) Secondary Dressing: Kerlix 4.5 x 4.1 (in/yd) 3 x Per Week/30 Days Discharge Instructions: Apply kerlix over ABD pad Secured With: 59M Medipore H Soft Cloth Surgical Tape, 2x2 (in/yd) 3 x Per Week/30 Days Discharge Instructions: Secure kerlix WOUND #9: - Toe Second Wound Laterality: Right Cleanser: Normal Saline 3 x Per Week/30 Days Discharge Instructions: Wash your hands with soap and water. Remove old dressing, discard into plastic bag and place into trash. Cleanse the wound with Normal Saline prior to applying a clean dressing using gauze sponges, not tissues or cotton balls. Do not scrub or use excessive force. Pat dry using gauze sponges, not tissue or cotton balls. Primary Dressing: Xeroform-HBD 2x2 (in/in) 3 x Per Week/30 Days Chad [name], Chad [name] ([id number]) Discharge Instructions: Apply Xeroform-HBD 2x2 (in/in) as directed Secondary Dressing: Gauze 3 x Per Week/30 Days Discharge Instructions: As directed: dry, moistened with saline or moistened with Dakins Solution Secured With: 59M Medipore H Soft Cloth Surgical Tape, 2x2 (in/yd) 3 x Per Week/30 Days Discharge Instructions: Secure kerlix 1. I would recommend currently that we  go ahead and initiate treatment with a continuation of the [facility] for the left ankle area that seems to be doing quite well. 2. Also can recommend that we use Xeroform for the right toe. Actually did remove the toenail as well as this was hanging on by just a little corner. That is on the second toe right foot. This should allow that to heal much more effectively and make sure this is not a problem. Also did have to perform some debridement in regard to the joint area where there is a wound and there was a little bit of bone removed here as well  but it seems to be solid underneath I am hopeful that with a continuation of doxycycline this will continue to show signs of good improvement. 3. I did go ahead and refill doxycycline for him today this will be 1 capsule by mouth 2 times a day for 30 days dispense 60. We will see patient back for reevaluation in 1 week here in the clinic. If anything worsens or changes patient will contact our office for additional recommendations. Electronic Signature(s) Signed: 07/15/2021 5:33:47 PM By: Worthy Keeler PA-C Previous Signature: 07/15/2021 10:26:49 AM Version By: Worthy Keeler PA-C Previous Signature: 07/15/2021 10:24:38 AM Version By: Worthy Keeler PA-C Entered By: Worthy Keeler on 07/15/2021 17:33:47 Chad [name] ([id number]) -------------------------------------------------------------------------------- SuperBill Details Patient Name: Chad [name] Date of Service: 07/15/2021 Medical Record Number: [id number] Patient Account Number: [id number] Date of Birth/Sex: [birth year] (60 y.o. 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

## 2021-07-17 NOTE — Progress Notes (Signed)
ERNIE, KASLER ([id number]) Visit Report for 07/15/2021 Arrival Information Details Patient Name: Chad [name], Chad [name] Date of Service: 07/15/2021 9:45 AM Medical Record Number: [id number] Patient Account Number: [id number] Date of Birth/Sex: May 15, 1961 (60 y.o. M) Treating RN: [name] Primary Care Jerline Linzy: SYSTEM, PCP Other Clinician: Referring Erik Nessel: Clayborn Bigness Treating Quaneshia Wareing/Extender: Skipper Cliche in Treatment: 47 Visit Information History Since Last Visit Added or deleted any medications: No Patient Arrived: Wheel Chair Any new allergies or adverse reactions: No Arrival Time: 09:21 Had a fall or experienced change in No Accompanied By: self activities of daily living that may affect Transfer Assistance: None risk of falls: Patient Identification Verified: Yes Hospitalized since last visit: No Secondary Verification Process Completed: Yes Has Dressing in Place as Prescribed: Yes Patient Requires Transmission-Based No Pain Present Now: No Precautions: Patient Has Alerts: Yes Patient Alerts: Patient on Blood Thinner ABI right 1.16 ABI left 1.14 SNF/[facility] Electronic Signature(s) Signed: 07/15/2021 11:37:03 AM By: Donnamarie Poag Entered By: Donnamarie Poag on 07/15/2021 11:37:02 Chad [name] ([id number]) -------------------------------------------------------------------------------- Encounter Discharge Information Details Patient Name: Chad [name] Date of Service: 07/15/2021 9:45 AM Medical Record Number: [id number] Patient Account Number: [id number] Date of Birth/Sex: 02-15-1961 (60 y.o. M) Treating RN: [name] Primary Care Rye Decoste: SYSTEM, PCP Other Clinician: Referring Skyanne Welle: Clayborn Bigness Treating Iaan Oregel/Extender: Skipper Cliche in Treatment: 47 Encounter Discharge Information Items Post Procedure Vitals Discharge Condition: Stable Temperature (F): 98.3 Ambulatory Status: Wheelchair Pulse (bpm): 90 Discharge  Destination: [place] Respiratory Rate (breaths/min): 18 Orders Sent: Yes Blood Pressure (mmHg): 114/79 Transportation: Other Accompanied By: self/transport Schedule Follow-up Appointment: Yes Clinical Summary of Care: Electronic Signature(s) Signed: 07/17/2021 12:56:33 PM By: [name] Entered By: [name] on 07/15/2021 10:43:15 Chad [name] ([id number]) -------------------------------------------------------------------------------- Lower Extremity Assessment Details Patient Name: Chad [name] Date of Service: 07/15/2021 9:45 AM Medical Record Number: [id number] Patient Account Number: [id number] Date of Birth/Sex: Apr 20, 1961 (60 y.o. M) Treating RN: [name] Primary Care Kieffer Blatz: SYSTEM, PCP Other Clinician: Referring Tyrianna Lightle: Clayborn Bigness Treating Rakeen Gaillard/Extender: Jeri Cos Weeks in Treatment: 47 Edema Assessment Assessed: [Left: No] [Right: No] Edema: [Left: No] [Right: No] Vascular Assessment Pulses: Dorsalis Pedis Palpable: [Left:Yes] [Right:Yes] Electronic Signature(s) Signed: 07/17/2021 12:56:33 PM By: [name] Entered By: [name] on 07/15/2021 09:38:48 Chad [name] ([id number]) -------------------------------------------------------------------------------- Multi Wound Chart Details Patient Name: Chad [name] Date of Service: 07/15/2021 9:45 AM Medical Record Number: [id number] Patient Account Number: [id number] Date of Birth/Sex: 1961/03/25 (60 y.o. M) Treating RN: [name] Primary Care Raksha Wolfgang: SYSTEM, PCP Other Clinician: Referring Mavi Un: Clayborn Bigness Treating Panda Crossin/Extender: Skipper Cliche in Treatment: 47 Vital Signs Height(in): 69 Pulse(bpm): 62 Weight(lbs): 160 Blood Pressure(mmHg): 114/79 Body Mass Index(BMI): 25 Temperature(F): 98.3 Respiratory Rate(breaths/min): 18 Photos: [N/A:N/A] Wound Location: Left, Medial Ankle Right Toe Second N/A Wounding Event:  Gradually Appeared Gradually Appeared N/A Primary Etiology: Venous Leg Ulcer Pressure Ulcer N/A Comorbid History: Anemia, Chronic Obstructive Anemia, Chronic Obstructive N/A Pulmonary Disease (COPD), Pulmonary Disease (COPD), Hepatitis C, Received Hepatitis C, Received Chemotherapy Chemotherapy Date Acquired: 07/12/2019 05/31/2021 N/A Weeks of Treatment: 47 4 N/A Wound Status: Open Open N/A Measurements L x W x D (cm) 1.3x0.5x0.1 0.2x0.8x0.2 N/A Area (cm) : 0.511 0.126 N/A Volume (cm) : 0.051 0.025 N/A % Reduction in Area: 91.30% 66.60% N/A % Reduction in Volume: 91.30% 66.70% N/A Classification: Full Thickness Without Exposed Category/Stage IV N/A Support Structures Exudate Amount: Medium Medium N/A Exudate Type: Serosanguineous Serosanguineous N/A Exudate Color: red, brown red, brown N/A Wound Margin: Flat and Intact Flat and Intact N/A Granulation  Amount: Large (67-100%) Small (1-33%) N/A Granulation Quality: Pink, Pale Pink N/A Necrotic Amount: Small (1-33%) Large (67-100%) N/A Exposed Structures: Fat Layer (Subcutaneous Tissue): Fat Layer (Subcutaneous Tissue): N/A Yes Yes Fascia: No Tendon: Yes Tendon: No Joint: Yes Muscle: No Bone: Yes Joint: No Fascia: No Bone: No Muscle: No Epithelialization: Medium (34-66%) None N/A Treatment Notes Electronic Signature(s) Signed: 07/17/2021 12:56:33 PM By: [name] Entered By: [name] on 07/15/2021 10:09:36 Chad [name] ([id number]) -------------------------------------------------------------------------------- Multi-Disciplinary Care Plan Details Patient Name: Chad [name] Date of Service: 07/15/2021 9:45 AM Medical Record Number: [id number] Patient Account Number: [id number] Date of Birth/Sex: September 08, 1960 (60 y.o. M) Treating RN: [name] Primary Care Anushka Hartinger: SYSTEM, PCP Other Clinician: Referring Shantea Poulton: Clayborn Bigness Treating Mayme Profeta/Extender: Skipper Cliche in Treatment: 47 Active  Inactive Necrotic Tissue Nursing Diagnoses: Impaired tissue integrity related to necrotic/devitalized tissue Knowledge deficit related to management of necrotic/devitalized tissue Goals: Necrotic/devitalized tissue will be minimized in the wound bed Date Initiated: 05/20/2021 Target Resolution Date: 05/20/2021 Goal Status: Active Patient/caregiver will verbalize understanding of reason and process for debridement of necrotic tissue Date Initiated: 05/20/2021 Date Inactivated: 06/17/2021 Target Resolution Date: 05/20/2021 Goal Status: Met Interventions: Assess patient pain level pre-, during and post procedure and prior to discharge Provide education on necrotic tissue and debridement process Treatment Activities: Apply topical anesthetic as ordered : 05/20/2021 Notes: Osteomyelitis Nursing Diagnoses: Infection: osteomyelitis Knowledge deficit related to disease process and management Goals: Diagnostic evaluation for osteomyelitis completed as ordered Date Initiated: 06/24/2021 Target Resolution Date: 06/21/2021 Goal Status: Active Patient/caregiver will verbalize understanding of disease process and disease management Date Initiated: 06/24/2021 Target Resolution Date: 06/24/2021 Goal Status: Active Patient's osteomyelitis will resolve Date Initiated: 06/24/2021 Target Resolution Date: 07/22/2021 Goal Status: Active Signs and symptoms for osteomyelitis will be recognized and promptly addressed Date Initiated: 06/24/2021 Target Resolution Date: 06/24/2021 Goal Status: Active Interventions: Assess for signs and symptoms of osteomyelitis resolution every visit Provide education on osteomyelitis Treatment Activities: MRI : 06/21/2021 Systemic antibiotics : 06/24/2021 Notes: AMALIO, LOE ([id number]) Electronic Signature(s) Signed: 07/17/2021 12:56:33 PM By: [name] Entered By: [name] on 07/15/2021 10:08:58 Chad [name]  ([id number]) -------------------------------------------------------------------------------- Pain Assessment Details Patient Name: Chad [name] Date of Service: 07/15/2021 9:45 AM Medical Record Number: [id number] Patient Account Number: [id number] Date of Birth/Sex: 15-Feb-1961 (60 y.o. M) Treating RN: [name] Primary Care Chaddrick Brue: SYSTEM, PCP Other Clinician: Referring Oliana Gowens: Clayborn Bigness Treating Mathea Frieling/Extender: Skipper Cliche in Treatment: 47 Active Problems Location of Pain Severity and Description of Pain Patient Has Paino No Site Locations Rate the pain. Current Pain Level: 0 Pain Management and Medication Current Pain Management: Electronic Signature(s) Signed: 07/17/2021 12:56:33 PM By: [name] Entered By: [name] on 07/15/2021 09:25:01 Chad [name] ([id number]) -------------------------------------------------------------------------------- Patient/Caregiver Education Details Patient Name: Chad [name] Date of Service: 07/15/2021 9:45 AM Medical Record Number: [id number] Patient Account Number: [id number] Date of Birth/Gender: Jun 01, 1961 (60 y.o. M) Treating RN: [name] Primary Care Physician: SYSTEM, PCP Other Clinician: Referring Physician: Clayborn Bigness Treating Physician/Extender: Skipper Cliche in Treatment: 73 Education Assessment Education Provided To: Patient Education Topics Provided Infection: Handouts: Infection Prevention and Management Methods: Explain/Verbal Responses: State content correctly Wound Debridement: Handouts: Wound Debridement Methods: Explain/Verbal Responses: State content correctly Wound/Skin Impairment: Handouts: Caring for Your Ulcer Methods: Explain/Verbal Responses: State content correctly Electronic Signature(s) Signed: 07/17/2021 12:56:33 PM By: [name] Entered By: [name] on 07/15/2021 10:40:44 Chad [name]  ([id number]) -------------------------------------------------------------------------------- Wound Assessment Details Patient Name: Chad [name] Date of Service: 07/15/2021 9:45 AM Medical Record Number:  [id number] Patient Account Number: [id number] Date of Birth/Sex: 02/13/1961 (60 y.o. M) Treating RN: [name] Primary Care Stan Cantave: SYSTEM, PCP Other Clinician: Referring Mallorey Odonell: Clayborn Bigness Treating Ria Redcay/Extender: Skipper Cliche in Treatment: 47 Wound Status Wound Number: 10 Primary Trauma, Other Etiology: Wound Location: Right, Distal Toe Second Wound Open Wounding Event: Trauma Status: Date Acquired: 07/15/2021 Comorbid Anemia, Chronic Obstructive Pulmonary Disease (COPD), Weeks Of Treatment: 0 History: Hepatitis C, Received Chemotherapy Clustered Wound: No Photos Wound Measurements Length: (cm) Width: (cm) Depth: (cm) Area: (cm) Volume: (cm) 1 % Reduction in Area: 1 % Reduction in Volume: 0.1 0.785 0.079 Wound Description Classification: Full Thickness Without Exposed Support Structures Exudate Amount: Small Exudate Type: Sanguinous Exudate Color: red Foul Odor After Cleansing: No Slough/Fibrino No Wound Bed Granulation Amount: Large (67-100%) Exposed Structure Granulation Quality: Red Fat Layer (Subcutaneous Tissue) Exposed: Yes Necrotic Amount: None Present (0%) Treatment Notes Wound #10 (Toe Second) Wound Laterality: Right, Distal Cleanser Normal Saline Discharge Instruction: Wash your hands with soap and water. Remove old dressing, discard into plastic bag and place into trash. Cleanse the wound with Normal Saline prior to applying a clean dressing using gauze sponges, not tissues or cotton balls. Do not scrub or use excessive force. Pat dry using gauze sponges, not tissue or cotton balls. Peri-Wound Care Topical Primary Dressing SAMBA, CUMBA ([id number]) Xeroform-HBD 2x2 (in/in) Discharge Instruction: Apply Xeroform-HBD 2x2  (in/in) as directed Secondary Dressing Gauze Discharge Instruction: As directed: dry, moistened with saline or moistened with Dakins Solution Secured With 52M Medipore H Soft Cloth Surgical Tape, 2x2 (in/yd) Compression Wrap Compression Stockings Add-Ons Electronic Signature(s) Signed: 07/17/2021 12:56:33 PM By: [name] Entered By: [name] on 07/15/2021 10:21:45 Chad [name] ([id number]) -------------------------------------------------------------------------------- Wound Assessment Details Patient Name: Chad [name] Date of Service: 07/15/2021 9:45 AM Medical Record Number: [id number] Patient Account Number: [id number] Date of Birth/Sex: October 29, 1960 (60 y.o. M) Treating RN: [name] Primary Care Evanee Lubrano: SYSTEM, PCP Other Clinician: Referring Laurent Cargile: Clayborn Bigness Treating Marcia Lepera/Extender: Skipper Cliche in Treatment: 47 Wound Status Wound Number: 8 Primary Venous Leg Ulcer Etiology: Wound Location: Left, Medial Ankle Wound Open Wounding Event: Gradually Appeared Status: Date Acquired: 07/12/2019 Comorbid Anemia, Chronic Obstructive Pulmonary Disease (COPD), Weeks Of Treatment: 47 History: Hepatitis C, Received Chemotherapy Clustered Wound: No Photos Wound Measurements Length: (cm) 1.3 Width: (cm) 0.5 Depth: (cm) 0.1 Area: (cm) 0.511 Volume: (cm) 0.051 % Reduction in Area: 91.3% % Reduction in Volume: 91.3% Epithelialization: Medium (34-66%) Wound Description Classification: Full Thickness Without Exposed Support Structures Wound Margin: Flat and Intact Exudate Amount: Medium Exudate Type: Serosanguineous Exudate Color: red, brown Foul Odor After Cleansing: No Slough/Fibrino Yes Wound Bed Granulation Amount: Large (67-100%) Exposed Structure Granulation Quality: Pink, Pale Fascia Exposed: No Necrotic Amount: Small (1-33%) Fat Layer (Subcutaneous Tissue) Exposed: Yes Necrotic Quality: Adherent Slough Tendon Exposed:  No Muscle Exposed: No Joint Exposed: No Bone Exposed: No Treatment Notes Wound #8 (Ankle) Wound Laterality: Left, Medial Cleanser Normal Saline Discharge Instruction: Wash your hands with soap and water. Remove old dressing, discard into plastic bag and place into trash. Cleanse the wound with Normal Saline prior to applying a clean dressing using gauze sponges, not tissues or cotton balls. Do not scrub or use excessive force. Pat dry using gauze sponges, not tissue or cotton balls. JARION, HAWTHORNE ([id number]) Peri-Wound Care Desitin Maximum Strength Ointment 4 (oz) Discharge Instruction: Apply zinc periwound Topical Primary Dressing Hydrofera Blue Ready Transfer Foam, 2.5x2.5 (in/in) Discharge Instruction: Apply Hydrofera Blue Ready to wound bed as directed (EXTRA SENT  WITH PATIENT, PLEASE USE) Secondary Dressing Kerlix 4.5 x 4.1 (in/yd) Discharge Instruction: Apply kerlix over ABD pad Secured With 61M [place] Surgical Tape, 2x2 (in/yd) Discharge Instruction: Secure kerlix Compression Wrap Compression Stockings Add-Ons Electronic Signature(s) Signed: 07/17/2021 12:56:33 PM By: [name] Entered By: [name] on 07/15/2021 09:35:17 Chad [name] ([id number]) -------------------------------------------------------------------------------- Wound Assessment Details Patient Name: Chad [name] Date of Service: 07/15/2021 9:45 AM Medical Record Number: [id number] Patient Account Number: [id number] Date of Birth/Sex: [birth year] (60 y.o. M) Treating RN: [name] Primary Care Ema Hebner: SYSTEM, PCP Other Clinician: Referring Rella Egelston: Clayborn Bigness Treating Jaydeen Darley/Extender: Skipper Cliche in Treatment: 47 Wound Status Wound Number: 9 Primary Pressure Ulcer Etiology: Wound Location: Right Toe Second Wound Open Wounding Event: Gradually Appeared Status: Date Acquired: 05/31/2021 Comorbid Anemia, Chronic Obstructive Pulmonary Disease  (COPD), Weeks Of Treatment: 4 History: Hepatitis C, Received Chemotherapy Clustered Wound: No Photos Wound Measurements Length: (cm) 0.2 Width: (cm) 0.8 Depth: (cm) 0.2 Area: (cm) 0.126 Volume: (cm) 0.025 % Reduction in Area: 66.6% % Reduction in Volume: 66.7% Epithelialization: None Wound Description Classification: Category/Stage IV Wound Margin: Flat and Intact Exudate Amount: Medium Exudate Type: Serosanguineous Exudate Color: red, brown Foul Odor After Cleansing: No Slough/Fibrino Yes Wound Bed Granulation Amount: Small (1-33%) Exposed Structure Granulation Quality: Pink Fascia Exposed: No Necrotic Amount: Large (67-100%) Fat Layer (Subcutaneous Tissue) Exposed: Yes Necrotic Quality: Adherent Slough Tendon Exposed: Yes Muscle Exposed: No Joint Exposed: Yes Bone Exposed: Yes Treatment Notes Wound #9 (Toe Second) Wound Laterality: Right Cleanser Normal Saline Discharge Instruction: Wash your hands with soap and water. Remove old dressing, discard into plastic bag and place into trash. Cleanse the wound with Normal Saline prior to applying a clean dressing using gauze sponges, not tissues or cotton balls. Do not scrub or use excessive force. Pat dry using gauze sponges, not tissue or cotton balls. JABEZ, MOLNER ([id number]) Peri-Wound Care Topical Primary Dressing Xeroform-HBD 2x2 (in/in) Discharge Instruction: Apply Xeroform-HBD 2x2 (in/in) as directed Secondary Dressing Gauze Discharge Instruction: As directed: dry, moistened with saline or moistened with Dakins Solution Secured With 61M Medipore H Soft Cloth Surgical Tape, 2x2 (in/yd) Discharge Instruction: Secure kerlix Compression Wrap Compression Stockings Add-Ons Electronic Signature(s) Signed: 07/17/2021 12:56:33 PM By: [name] Entered By: [name] on 07/15/2021 09:36:52 Chad [name]  ([id number]) -------------------------------------------------------------------------------- [place] Details Patient Name: Chad [name] Date of Service: 07/15/2021 9:45 AM Medical Record Number: [id number] Patient Account Number: [id number] Date of Birth/Sex: 1960-12-21 (60 y.o. M) Treating RN: [name] Primary Care Jazminn Pomales: SYSTEM, PCP Other Clinician: Referring Atzin Buchta: Clayborn Bigness Treating Hallie Ertl/Extender: Skipper Cliche in Treatment: 47 Vital Signs Time Taken: 09:24 Temperature (F): 98.3 Height (in): 67 Pulse (bpm): 90 Weight (lbs): 160 Respiratory Rate (breaths/min): 18 Body Mass Index (BMI): 25.1 Blood Pressure (mmHg): 114/79 Reference Range: 80 - 120 mg / dl Electronic Signature(s) Signed: 07/17/2021 12:56:33 PM By: [name] Entered By: [name] on 07/15/2021 09:24:46

## 2021-07-23 ENCOUNTER — Ambulatory Visit: Payer: Medicaid Other | Admitting: Internal Medicine

## 2021-07-24 ENCOUNTER — Inpatient Hospital Stay: Payer: Medicaid Other

## 2021-07-24 ENCOUNTER — Encounter: Payer: Self-pay | Admitting: Internal Medicine

## 2021-07-24 ENCOUNTER — Other Ambulatory Visit: Payer: Self-pay

## 2021-07-24 ENCOUNTER — Inpatient Hospital Stay (HOSPITAL_BASED_OUTPATIENT_CLINIC_OR_DEPARTMENT_OTHER): Payer: Medicaid Other | Admitting: Internal Medicine

## 2021-07-24 DIAGNOSIS — Z15068 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 of digestive system: Secondary | ICD-10-CM

## 2021-07-24 DIAGNOSIS — C661 Malignant neoplasm of right ureter: Secondary | ICD-10-CM

## 2021-07-24 DIAGNOSIS — Z7189 Other specified counseling: Secondary | ICD-10-CM

## 2021-07-24 DIAGNOSIS — Z5112 Encounter for antineoplastic immunotherapy: Secondary | ICD-10-CM | POA: Diagnosis not present

## 2021-07-24 DIAGNOSIS — C182 Malignant neoplasm of ascending colon: Secondary | ICD-10-CM

## 2021-07-24 DIAGNOSIS — Z1509 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: Secondary | ICD-10-CM

## 2021-07-24 DIAGNOSIS — Z515 Encounter for palliative care: Secondary | ICD-10-CM

## 2021-07-24 LAB — COMPREHENSIVE METABOLIC PANEL
ALT: 17 U/L (ref 0–44)
AST: 30 U/L (ref 15–41)
Albumin: 3.8 g/dL (ref 3.5–5.0)
Alkaline Phosphatase: 94 U/L (ref 38–126)
Anion gap: 8 (ref 5–15)
BUN: 25 mg/dL — ABNORMAL HIGH (ref 8–23)
CO2: 23 mmol/L (ref 22–32)
Calcium: 8.9 mg/dL (ref 8.9–10.3)
Chloride: 103 mmol/L (ref 98–111)
Creatinine, Ser: 1.09 mg/dL (ref 0.61–1.24)
GFR, Estimated: >60 mL/min (ref >60)
Glucose, Bld: 97 mg/dL (ref 70–99)
Potassium: 4.6 mmol/L (ref 3.5–5.1)
Sodium: 134 mmol/L — ABNORMAL LOW (ref 135–145)
Total Bilirubin: 0.4 mg/dL (ref 0.3–1.2)
Total Protein: 7.8 g/dL (ref 6.5–8.1)

## 2021-07-24 LAB — CBC WITH DIFFERENTIAL/PLATELET
Abs Immature Granulocytes: 0.01 10*3/uL (ref 0.00–0.07)
Basophils Absolute: 0.1 10*3/uL (ref 0.0–0.1)
Basophils Relative: 1 %
Eosinophils Absolute: 0.3 10*3/uL (ref 0.0–0.5)
Eosinophils Relative: 4 %
HCT: 40.2 % (ref 39.0–52.0)
Hemoglobin: 12.7 g/dL — ABNORMAL LOW (ref 13.0–17.0)
Immature Granulocytes: 0 %
Lymphocytes Relative: 20 %
Lymphs Abs: 1.4 10*3/uL (ref 0.7–4.0)
MCH: 25.4 pg — ABNORMAL LOW (ref 26.0–34.0)
MCHC: 31.6 g/dL (ref 30.0–36.0)
MCV: 80.4 fL (ref 80.0–100.0)
Monocytes Absolute: 0.6 10*3/uL (ref 0.1–1.0)
Monocytes Relative: 8 %
Neutro Abs: 4.8 10*3/uL (ref 1.7–7.7)
Neutrophils Relative %: 67 %
Platelets: 216 10*3/uL (ref 150–400)
RBC: 5 MIL/uL (ref 4.22–5.81)
RDW: 15.4 % (ref 11.5–15.5)
WBC: 7.2 10*3/uL (ref 4.0–10.5)
nRBC: 0 % (ref 0.0–0.2)

## 2021-07-24 LAB — TSH: TSH: 2 u[IU]/mL (ref 0.350–4.500)

## 2021-07-24 MED ORDER — SODIUM CHLORIDE 0.9 % IV SOLN
Freq: Once | INTRAVENOUS | Status: AC
  Filled 2021-07-24: qty 250

## 2021-07-24 MED ORDER — HEPARIN SOD (PORK) LOCK FLUSH 100 UNIT/ML IV SOLN
500.0000 [IU] | Freq: Once | INTRAVENOUS | Status: AC | PRN
  Administered 2021-07-24: 10:00:00 500 [IU]
  Filled 2021-07-24: qty 5

## 2021-07-24 MED ORDER — SODIUM CHLORIDE 0.9 % IV SOLN
200.0000 mg | Freq: Once | INTRAVENOUS | Status: AC
  Administered 2021-07-24: 10:00:00 200 mg via INTRAVENOUS
  Filled 2021-07-24: qty 8

## 2021-07-24 NOTE — Progress Notes (Signed)
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  Is alone/in a wheelchair.  Chad [name] 60 y.o.  male above history of stage IV-ureteral cancer/history of stage III colon cancer right side; recurrent sigmoid colon cancer [un-resected/under surveillance] and multiple other comorbidities currently on Keytruda is here for follow-up.  Patient's colonoscopy pending next week on January 4.  Denies any blood in stool or black-colored stools.  Denies any constipation.  Denies any new symptoms of abdominal pain nausea vomiting.  No blood in urine.  Chronic shortness of breath.  Review of Systems  Constitutional:  Positive for  malaise/fatigue. Negative for chills, diaphoresis, fever and weight loss.  HENT:  Negative for nosebleeds and sore throat.   Eyes:  Negative for double vision.  Respiratory:  Positive for cough and shortness of breath. Negative for hemoptysis and wheezing.   Cardiovascular:  Negative for chest pain, palpitations and orthopnea.  Gastrointestinal:  Positive for constipation. Negative for abdominal pain, blood in stool, diarrhea, heartburn, melena, nausea and vomiting.  Genitourinary:  Negative for dysuria, frequency and urgency.  Musculoskeletal:  Positive for back pain and joint pain.  Skin: Negative.  Negative for itching and rash.  Neurological:  Positive for focal weakness. Negative for dizziness, tingling, weakness and headaches.       Chronic left-sided weakness upper than lower extremity.  Endo/Heme/Allergies:  Does not bruise/bleed easily.  Psychiatric/Behavioral:  Negative for depression. The patient is not nervous/anxious and does not have insomnia.     MEDICAL HISTORY:  Past Medical History:  Diagnosis Date   Anemia    Anxiety    ARF (acute respiratory failure) (HCC)    Bladder cancer (HCC)    COPD (chronic obstructive pulmonary disease) ([place])    Depression    Dysphagia    Family history of colon cancer    Family history of kidney cancer    Family history of leukemia    Family history of prostate cancer    GERD (gastroesophageal reflux disease)    Hepatitis    chronic hep c   Hydronephrosis    Hydronephrosis with ureteral stricture    Hyperlipidemia    Knee pain    Left   Malignant neoplasm of colon ([place])    Nerve pain    Peripheral  vascular disease ([place])    Prostate cancer ([place])    Stroke [facility])    Ureteral cancer, right ([place])    Urinary frequency    Venous hypertension of both lower extremities     SURGICAL HISTORY: Past Surgical History:  Procedure Laterality Date   COLON SURGERY     En bloc extended right hemicolectomy 07/2017   COLONOSCOPY WITH PROPOFOL  N/A 11/06/2020   Procedure: COLONOSCOPY WITH PROPOFOL;  Surgeon: Jonathon Bellows, MD;  Location: [facility] ENDOSCOPY;  Service: Gastroenterology;  Laterality: N/A;   CYSTOSCOPY W/ RETROGRADES Right 08/30/2018   Procedure: CYSTOSCOPY WITH RETROGRADE PYELOGRAM;  Surgeon: Hollice Espy, MD;  Location: [facility] ORS;  Service: Urology;  Laterality: Right;   CYSTOSCOPY WITH STENT PLACEMENT Right 04/25/2018   Procedure: CYSTOSCOPY WITH STENT PLACEMENT;  Surgeon: Hollice Espy, MD;  Location: [facility] ORS;  Service: Urology;  Laterality: Right;   CYSTOSCOPY WITH STENT PLACEMENT Right 08/30/2018   Procedure: [place];  Surgeon: Hollice Espy, MD;  Location: [facility] ORS;  Service: Urology;  Laterality: Right;   CYSTOSCOPY WITH STENT PLACEMENT Right 03/07/2019   Procedure: CYSTOSCOPY WITH STENT Exchange;  Surgeon: Hollice Espy, MD;  Location: [facility] ORS;  Service: Urology;  Laterality: Right;   CYSTOSCOPY WITH STENT PLACEMENT Right 11/21/2019   Procedure: CYSTOSCOPY WITH STENT Exchange;  Surgeon: Hollice Espy, MD;  Location: [facility] ORS;  Service: Urology;  Laterality: Right;   LOWER EXTREMITY ANGIOGRAPHY Left 05/23/2019   Procedure: LOWER EXTREMITY ANGIOGRAPHY;  Surgeon: Algernon Huxley, MD;  Location: [place] CV LAB;  Service: Cardiovascular;  Laterality: Left;   LOWER EXTREMITY ANGIOGRAPHY Right 05/30/2019   Procedure: LOWER EXTREMITY ANGIOGRAPHY;  Surgeon: Algernon Huxley, MD;  Location: [place] CV LAB;  Service: Cardiovascular;  Laterality: Right;   LOWER EXTREMITY ANGIOGRAPHY Right 02/13/2020   Procedure: LOWER EXTREMITY ANGIOGRAPHY;  Surgeon: Algernon Huxley, MD;  Location: [place] CV LAB;  Service: Cardiovascular;  Laterality: Right;   LOWER EXTREMITY ANGIOGRAPHY Left 02/20/2020   Procedure: LOWER EXTREMITY ANGIOGRAPHY;  Surgeon: Algernon Huxley, MD;  Location: [place] CV LAB;  Service: Cardiovascular;  Laterality: Left;   PORTA CATH INSERTION N/A 02/28/2019   Procedure: PORTA CATH  INSERTION;  Surgeon: Algernon Huxley, MD;  Location: [place] CV LAB;  Service: Cardiovascular;  Laterality: N/A;   tumor removed       SOCIAL HISTORY: Social History   Socioeconomic History   Marital status: Single    Spouse name: Not on file   Number of children: Not on file   Years of education: Not on file   Highest education level: Not on file  Occupational History   Not on file  Tobacco Use   Smoking status: Every Day    Packs/day: 1.00    Types: Cigarettes   Smokeless tobacco: Never  Vaping Use   Vaping Use: Never used  Substance and Sexual Activity   Alcohol use: Not Currently   Drug use: Not Currently   Sexual activity: Not Currently  Other Topics Concern   Not on file  Social History Narrative    used to live [place]; moved  To [place]- end of April 2019; in Nursing home; 1pp/day; quit alcohol. 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        Family- dad- prostate ca [at 55y]; brother- 38 died of prostate cancer; brother- 24- no cancers [[place]]; sonGerald Stabs [[place]];[name]-32y prostate ca [facility] [place]]; daughter- 52 [[place]]; another daughter 76 [[place]/addict]. will refer genetics counseling. Given  MSI- abnormal; highly suspicious of Lynch syndrome.  Patient's son [name] aware of high possible lynch syndrome.   Social Determinants of Health   Financial Resource Strain: Not on file  Food Insecurity: Not on file  Transportation Needs: Not on file  Physical Activity: Not on file  Stress: Not on file  Social Connections: Not on file  Intimate Partner Violence: Not on file    FAMILY HISTORY: Family History  Problem Relation Age of Onset   Prostate cancer Father 36   Cancer Brother 60       unsure type   Cancer Paternal Uncle        unsure type   Cancer Maternal Grandmother        unsure type   Cancer Paternal Grandmother        unsure type   Kidney cancer Paternal Grandfather    Cancer Other        unsure types   Leukemia Son    Cancer Son        other cancers, possibly  colon    ALLERGIES:  is allergic to penicillins.  MEDICATIONS:  Current Outpatient Medications  Medication Sig Dispense Refill   acetaminophen (TYLENOL) 325 MG tablet Take 650 mg by mouth 3 (three) times daily.      aspirin EC 81 MG tablet Take 1 tablet (81 mg total) by mouth daily. 150 tablet 2   carboxymethylcellulose 1 % ophthalmic solution Apply 1 drop to eye 2 (two) times daily.     clopidogrel (PLAVIX) 75 MG tablet Take 1 tablet (75 mg total) by mouth daily. 30 tablet 11   cyclobenzaprine (FLEXERIL) 10 MG tablet Take 10 mg by mouth at bedtime.     escitalopram (LEXAPRO) 5 MG tablet Take 5 mg by mouth daily.     gabapentin (NEURONTIN) 100 MG capsule Take 100 mg by mouth 3 (three) times daily.     mirtazapine (REMERON) 7.5 MG tablet Take 7.5 mg by mouth at bedtime.     Oxycodone HCl 10 MG TABS Take 10 mg by mouth every 6 (six) hours as needed for pain.     pantoprazole (PROTONIX) 40 MG tablet Take 40 mg by mouth daily.     senna-docusate (SENOKOT-S) 8.6-50 MG tablet Take 1 tablet by mouth daily.     tamsulosin (FLOMAX) 0.4 MG CAPS capsule Take 0.4 mg by mouth daily.     atorvastatin (LIPITOR) 10 MG tablet Take 1 tablet (10 mg total) by mouth daily. 30 tablet 11   bisacodyl (DULCOLAX) 5 MG EC tablet Take two tablets morning and two tablets afternoon day prior to Miralax prep. (Patient not taking: Reported on 07/24/2021)     Cholecalciferol (VITAMIN D) 50 MCG (2000 UT) CAPS Take by mouth. (Patient not taking: Reported on 07/03/2021)     Cholecalciferol 50 MCG (2000 UT) CAPS SMARTSIG:1 Tablet(s) By Mouth Daily (Patient not taking: Reported on 07/03/2021)     doxycycline (VIBRA-TABS) 100 MG tablet Take 100 mg by mouth 2 (two) times daily. (Patient not taking: Reported on 07/24/2021)     polyethylene glycol powder (GLYCOLAX/MIRALAX) 17 GM/SCOOP powder One bottle for colonoscopy prep. Use as directed. (Patient not taking: Reported on 07/03/2021)     RESTORE CALCIUM ALGINATE EX Apply topically.  Note: Dressing located on left ankle. Facility cleaning wound "every day shift and applying wound cleanser and appication of calcium Alginate and cover with dry dressing and wrapped with Kerlix" (Patient not taking: Reported on 07/03/2021)     No current  facility-administered medications for this visit.      Marland Kitchen  PHYSICAL EXAMINATION: ECOG PERFORMANCE STATUS: 1 - Symptomatic but completely ambulatory  Vitals:   07/24/21 0847  BP: 117/80  Pulse: 81  Temp: (!) 97.1 F (36.2 C)  SpO2: 100%   Filed Weights   07/24/21 0847  Weight: 156 lb (70.8 kg)    Physical Exam HENT:     Head: Normocephalic and atraumatic.     Mouth/Throat:     Pharynx: No oropharyngeal exudate.  Eyes:     Pupils: Pupils are equal, round, and reactive to light.  Cardiovascular:     Rate and Rhythm: Normal rate and regular rhythm.  Pulmonary:     Effort: No respiratory distress.     Breath sounds: No wheezing.  Abdominal:     General: Bowel sounds are normal. There is no distension.     Palpations: Abdomen is soft. There is no mass.     Tenderness: There is no abdominal tenderness. There is no guarding or rebound.  Musculoskeletal:        General: No tenderness. Normal range of motion.     Cervical back: Normal range of motion and neck supple.  Skin:    General: Skin is warm.     Comments: Bilateral lower extremity ulcerations noted.  Pulses intact bilaterally.  Cystlike lesion noted right elbow-nontender.  No erythema noted.  Neurological:     Mental Status: He is oriented to person, place, and time.     Comments: Sleepy but easily arousable.  Chronic weakness left upper and lower extremity.  Psychiatric:        Mood and Affect: Affect normal.     LABORATORY DATA:  I have reviewed the data as listed Lab Results  Component Value Date   WBC 7.2 07/24/2021   HGB 12.7 (L) 07/24/2021   HCT 40.2 07/24/2021   MCV 80.4 07/24/2021   PLT 216 07/24/2021   Recent Labs    06/12/21 0855  07/03/21 0819 07/24/21 0820  NA 134* 138 134*  K 3.7 4.2 4.6  CL 101 104 103  CO2 _0 GLUCOSE 154* 90 97  BUN 20 21 25*  CREATININE 0.81 1.03 1.09  CALCIUM 8.5* 8.9 8.9  GFRNONAA >60 >60 >60  PROT 7.8 7.5 7.8  ALBUMIN 3.7 3.6 3.8  AST 32 34 30  ALT _1 ALKPHOS 109 97 94  BILITOT 0.2* 0.4 0.4    RADIOGRAPHIC STUDIES: I have personally reviewed the radiological images as listed and agreed with the findings in the report. No results found.  ASSESSMENT & PLAN:   Ureteral cancer, right ([place]) # High-grade urothelial cancer/cytology; likely of the right renal pelvis /upper ureter. On keytruda- OCT 4th, 2022-  The irregular/ill-defined wall thickening seen previously in the right renal pelvis and proximal ureter appears decreased in the interval with less enhancing tissue visible on today's study, but this is a subtle change. Stable 10 mm short axis portal caval lymph node. STABLE.   # Proceed with  [place] today; Labs today reviewed; Labs today reviewed;  acceptable for treatment today. will repeat scans at next visit  # Incidental on CT OCT 10th, 2022-Interval development of a 13 mm lobular polypoid lesion right mainstem- mucus/secretion Vs true lesion- will repeat scans at next visit.    #Descending colon mass- PET scan- FEB 2022-uptake in the cecum;  [history of Lynch syndrome]-colonoscopy 25 mm polyp; biopsy mucinous carcinoma- given risk vs benefits -hold surgery for  now;awaiting repeat colonoscopy on Jan 4th 2023. Discussed with Dr.Byrnett.   # Iron deficiency anemia-hemoglobin 11-12; February 2022 iron studies/ferritin-LOW; hold Venofer with infusions- STABLE.   # Bilateral LE ulcers-s/p wound care evaluation; s/p PTCA with Dr.Dew. bil Arterial Dopp-wnl-STABLE.    #DISPOSITION: #  Keytruda today # follow up in 3 weeks-;MD labs-cbc/cmp;  Keytruda;- Dr.B.   All questions were answered. The patient knows to call the clinic with any problems, questions or  concerns.    Cammie Sickle, MD 07/24/2021 12:25 PM

## 2021-07-24 NOTE — Patient Instructions (Signed)
[facility]  Discharge Instructions: ?Thank you for choosing [facility] to provide your oncology and hematology care.  ?If you have a lab appointment with the Cancer Center, please go directly to the Cancer Center and check in at the registration area. ? ?Wear comfortable clothing and clothing appropriate for easy access to any Portacath or PICC line.  ? ?We strive to give you quality time with your provider. You may need to reschedule your appointment if you arrive late (15 or more minutes).  Arriving late affects you and other patients whose appointments are after yours.  Also, if you miss three or more appointments without notifying the office, you may be dismissed from the clinic at the provider?s discretion.    ?  ?For prescription refill requests, have your pharmacy contact our office and allow 72 hours for refills to be completed.   ? ?  ?To help prevent nausea and vomiting after your treatment, we encourage you to take your nausea medication as directed. ? ?BELOW ARE SYMPTOMS THAT SHOULD BE REPORTED IMMEDIATELY: ?*FEVER GREATER THAN 100.4 F (38 ?C) OR HIGHER ?*CHILLS OR SWEATING ?*NAUSEA AND VOMITING THAT IS NOT CONTROLLED WITH YOUR NAUSEA MEDICATION ?*UNUSUAL SHORTNESS OF BREATH ?*UNUSUAL BRUISING OR BLEEDING ?*URINARY PROBLEMS (pain or burning when urinating, or frequent urination) ?*BOWEL PROBLEMS (unusual diarrhea, constipation, pain near the anus) ?TENDERNESS IN MOUTH AND THROAT WITH OR WITHOUT PRESENCE OF ULCERS (sore throat, sores in mouth, or a toothache) ?UNUSUAL RASH, SWELLING OR PAIN  ?UNUSUAL VAGINAL DISCHARGE OR ITCHING  ? ?Items with * indicate a potential emergency and should be followed up as soon as possible or go to the Emergency Department if any problems should occur. ? ?Please show the CHEMOTHERAPY ALERT CARD or IMMUNOTHERAPY ALERT CARD at check-in to the Emergency Department and triage nurse. ? ?Should you have questions after your visit  or need to cancel or reschedule your appointment, please contact [facility]  [phone] and follow the prompts.  Office hours are 8:00 a.m. to 4:30 p.m. Monday - Friday. Please note that voicemails left after 4:00 p.m. may not be returned until the following business day.  We are closed weekends and major holidays. You have access to a nurse at all times for urgent questions. Please call the main number to the clinic [phone] and follow the prompts. ? ?For any non-urgent questions, you may also contact your provider using MyChart. We now offer e-Visits for anyone 18 and older to request care online for non-urgent symptoms. For details visit [email or web address]. ?  ?Also download the MyChart app! Go to the app store, search "MyChart", open the app, select , and log in with your MyChart username and password. ? ?Due to Covid, a mask is required upon entering the hospital/clinic. If you do not have a mask, one will be given to you upon arrival. For doctor visits, patients may have 1 support person aged 18 or older with them. For treatment visits, patients cannot have anyone with them due to current Covid guidelines and our immunocompromised population.  ?

## 2021-07-24 NOTE — Assessment & Plan Note (Addendum)
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 On keytruda- OCT 4th, 2022-  The irregular/ill-defined wall thickening seen previously in the right renal pelvis and proximal ureter appears decreased in the interval with less enhancing tissue visible on today's study, but this is a subtle change. Stable 10 mm short axis portal caval lymph node. STABLE.   # Proceed with  [place] today; Labs today reviewed; Labs today reviewed;  acceptable for treatment today. will repeat scans at next visit  # Incidental on CT OCT 10th, 2022-Interval development of a 13 mm lobular polypoid lesion right mainstem- mucus/secretion Vs true lesion- will repeat scans at next visit.    #Descending colon mass- PET scan- FEB 2022-uptake in the cecum;  [history of Lynch syndrome]-colonoscopy 25 mm polyp; biopsy mucinous carcinoma- given risk vs benefits -hold surgery for now;awaiting repeat colonoscopy on Jan 4th 2023. Discussed with Dr.Byrnett.   # Iron deficiency anemia-hemoglobin 11-12; February 2022 iron studies/ferritin-LOW; hold Venofer with infusions- STABLE.   # Bilateral LE ulcers-s/p wound care evaluation; s/p PTCA with Dr.Dew. bil Arterial Dopp-wnl-STABLE.    #DISPOSITION: #  Keytruda today # follow up in 3 weeks-;MD labs-cbc/cmp;  Keytruda;- Dr.B.

## 2021-07-30 ENCOUNTER — Encounter: Payer: Self-pay | Admitting: General Surgery

## 2021-07-31 ENCOUNTER — Encounter
Admission: RE | Disposition: A | Discharge status: Home / Self Care | Payer: Self-pay | Source: Home / Self Care | Attending: General Surgery

## 2021-07-31 ENCOUNTER — Ambulatory Visit: Payer: Medicaid Other | Admitting: Certified Registered Nurse Anesthetist

## 2021-07-31 ENCOUNTER — Encounter: Payer: Self-pay | Admitting: General Surgery

## 2021-07-31 ENCOUNTER — Ambulatory Visit
Admission: RE | Admit: 2021-07-31 | Discharge: 2021-07-31 | Disposition: A | Discharge status: Home / Self Care | Payer: Medicaid Other | Attending: General Surgery | Admitting: General Surgery

## 2021-07-31 ENCOUNTER — Other Ambulatory Visit: Payer: Self-pay

## 2021-07-31 DIAGNOSIS — D128 Benign neoplasm of rectum: Secondary | ICD-10-CM | POA: Diagnosis not present

## 2021-07-31 DIAGNOSIS — Z1211 Encounter for screening for malignant neoplasm of colon: Secondary | ICD-10-CM | POA: Insufficient documentation

## 2021-07-31 DIAGNOSIS — F17210 Nicotine dependence, cigarettes, uncomplicated: Secondary | ICD-10-CM | POA: Insufficient documentation

## 2021-07-31 DIAGNOSIS — Z85038 Personal history of other malignant neoplasm of large intestine: Secondary | ICD-10-CM | POA: Diagnosis not present

## 2021-07-31 HISTORY — PX: COLONOSCOPY WITH PROPOFOL: SHX5780

## 2021-07-31 SURGERY — COLONOSCOPY WITH PROPOFOL
Anesthesia: General

## 2021-07-31 MED ORDER — CLOPIDOGREL BISULFATE 75 MG PO TABS: 75.0000 mg | ORAL_TABLET | Freq: Every day | ORAL | 11 refills | Status: DC

## 2021-07-31 MED ORDER — PROPOFOL 500 MG/50ML IV EMUL
INTRAVENOUS | Status: DC | PRN
  Administered 2021-07-31: 140 ug/kg/min via INTRAVENOUS

## 2021-07-31 MED ORDER — HEPARIN SOD (PORK) LOCK FLUSH 100 UNIT/ML IV SOLN
INTRAVENOUS | Status: AC
  Filled 2021-07-31: qty 5

## 2021-07-31 MED ORDER — PROPOFOL 10 MG/ML IV BOLUS
INTRAVENOUS | Status: DC | PRN
  Administered 2021-07-31 (×2): 10 mg via INTRAVENOUS
  Administered 2021-07-31: 50 mg via INTRAVENOUS

## 2021-07-31 MED ORDER — LIDOCAINE HCL (CARDIAC) PF 100 MG/5ML IV SOSY
PREFILLED_SYRINGE | INTRAVENOUS | Status: DC | PRN
  Administered 2021-07-31: 70 mg via INTRAVENOUS

## 2021-07-31 MED ORDER — PROPOFOL 500 MG/50ML IV EMUL
INTRAVENOUS | Status: AC
  Filled 2021-07-31: qty 150

## 2021-07-31 MED ORDER — SODIUM CHLORIDE 0.9 % IV SOLN
INTRAVENOUS | Status: DC
Start: 2021-07-31 — End: 2021-07-31

## 2021-07-31 MED ORDER — PHENYLEPHRINE HCL-NACL 20-0.9 MG/250ML-% IV SOLN
INTRAVENOUS | Status: AC
  Filled 2021-07-31: qty 250

## 2021-07-31 MED ORDER — PHENYLEPHRINE 40 MCG/ML (10ML) SYRINGE FOR IV PUSH (FOR BLOOD PRESSURE SUPPORT)
PREFILLED_SYRINGE | INTRAVENOUS | Status: DC | PRN
  Administered 2021-07-31: 160 ug via INTRAVENOUS
  Administered 2021-07-31: 80 ug via INTRAVENOUS
  Administered 2021-07-31: 160 ug via INTRAVENOUS
  Administered 2021-07-31 (×2): 80 ug via INTRAVENOUS
  Administered 2021-07-31: 160 ug via INTRAVENOUS

## 2021-07-31 NOTE — Op Note (Signed)
[facility] Gastroenterology Patient Name: Chad [name] Procedure Date: 07/31/2021 8:03 AM MRN: [id number] Account #: [id number] Date of Birth: 1960-12-01 Admit Type: Outpatient Age: 61 Room: [facility] ENDO ROOM 1 Gender: Male Note Status: Finalized Instrument Name: Peds Colonoscope [id number] Procedure:             Colonoscopy Indications:           High risk colon cancer surveillance: Personal history                         of colon cancer Providers:             Yehya Bellow, MD Medicines:             Propofol per Anesthesia Procedure:             Pre-Anesthesia Assessment:                        - Prior to the procedure, a History and Physical was                         performed, and patient medications, allergies and                         sensitivities were reviewed. The patient's tolerance                         of previous anesthesia was reviewed.                        - The risks and benefits of the procedure and the                         sedation options and risks were discussed with the                         patient. All questions were answered and informed                         consent was obtained.                        After obtaining informed consent, the colonoscope was                         passed under direct vision. Throughout the procedure,                         the patient's blood pressure, pulse, and oxygen                         saturations were monitored continuously. The                         Colonoscope was introduced through the anus and                         advanced to the the sigmoid colon. The colonoscopy was  performed with difficulty due to inadequate bowel prep                         and restricted mobility of the colon. The patient                         tolerated the procedure well. The quality of the bowel                         preparation was unsatisfactory. Findings:      A 20  mm polyp was found in the recto-sigmoid colon. The polyp was       sessile. The polyp was removed with a hot snare. Resection and retrieval       were complete. Impression:            - Preparation of the colon was unsatisfactory.                        - One 20 mm polyp at the recto-sigmoid colon, removed                         with a hot snare. Resected and retrieved. Recommendation:        - Discharge patient to a nursing home (via wheelchair). Procedure Code(s):     --- Professional ---                        [phone], 52, Colonoscopy, flexible; with removal of                         tumor(s), polyp(s), or other lesion(s) by snare                         technique Diagnosis Code(s):     --- Professional ---                        H68.372, Personal history of other malignant neoplasm                         of large intestine                        K63.5, Polyp of colon CPT copyright 2019 American Medical Association. All rights reserved. The codes documented in this report are preliminary and upon coder review may  be revised to meet current compliance requirements. Sergey Bellow, MD 07/31/2021 9:07:24 AM This report has been signed electronically. Number of Addenda: 0 Note Initiated On: 07/31/2021 8:03 AM Total Procedure Duration: 0 hours 25 minutes 16 seconds  Estimated Blood Loss:  Estimated blood loss: none.      [facility]

## 2021-07-31 NOTE — Anesthesia Postprocedure Evaluation (Signed)
Anesthesia Post Note  Patient: Chad [name]  Procedure(s) Performed: COLONOSCOPY WITH PROPOFOL  Patient location during evaluation: Endoscopy Anesthesia Type: General Level of consciousness: awake and alert Pain management: pain level controlled Vital Signs Assessment: post-procedure vital signs reviewed and stable Respiratory status: spontaneous breathing, nonlabored ventilation, respiratory function stable and patient connected to nasal cannula oxygen Cardiovascular status: blood pressure returned to baseline and stable Postop Assessment: no apparent nausea or vomiting Anesthetic complications: no   No notable events documented.   Last Vitals:  Vitals:   07/31/21 0930 07/31/21 0940  BP: 122/81 122/84  Pulse: 78 78  Resp: 14 17  Temp:    SpO2: 94% 97%    Last Pain:  Vitals:   07/31/21 0900  TempSrc: Temporal  PainSc:                  Precious Haws Irbin Fines

## 2021-07-31 NOTE — Anesthesia Preprocedure Evaluation (Signed)
Anesthesia Evaluation  Patient identified by MRN, date of birth, ID band Patient awake    Reviewed: Allergy & Precautions, NPO status , Patient's Chart, lab work & pertinent test results  History of Anesthesia Complications Negative for: history of anesthetic complications  Airway Mallampati: III  TM Distance: >3 FB Neck ROM: full    Dental  (+) Missing   Pulmonary COPD, Current Smoker,    Pulmonary exam normal        Cardiovascular (-) angina+ Peripheral Vascular Disease  Normal cardiovascular exam     Neuro/Psych PSYCHIATRIC DISORDERS CVA, Residual Symptoms negative psych ROS   GI/Hepatic GERD  Medicated and Controlled,(+) Hepatitis -  Endo/Other  negative endocrine ROS  Renal/GU Renal disease  negative genitourinary   Musculoskeletal   Abdominal   Peds  Hematology negative hematology ROS (+)   Anesthesia Other Findings Past Medical History: No date: Anemia No date: Anxiety No date: ARF (acute respiratory failure) (HCC) No date: Bladder cancer ([place]) No date: COPD (chronic obstructive pulmonary disease) (HCC) No date: Depression No date: Dysphagia No date: Family history of colon cancer No date: Family history of kidney cancer No date: Family history of leukemia No date: Family history of prostate cancer No date: GERD (gastroesophageal reflux disease) No date: Hepatitis     Comment:  chronic hep c No date: Hydronephrosis No date: Hydronephrosis with ureteral stricture No date: Hyperlipidemia No date: Knee pain     Comment:  Left No date: Malignant neoplasm of colon (HCC) No date: Nerve pain No date: Peripheral vascular disease (HCC) No date: Prostate cancer ([place]) No date: Stroke [facility]) No date: Ureteral cancer, right ([place]) No date: Urinary frequency No date: Venous hypertension of both lower extremities  Past Surgical History: No date: COLON SURGERY     Comment:  En bloc extended right  hemicolectomy 07/2017 11/06/2020: COLONOSCOPY WITH PROPOFOL; N/A     Comment:  Procedure: COLONOSCOPY WITH PROPOFOL;  Surgeon: Jonathon Bellows, MD;  Location: [facility] ENDOSCOPY;  Service:               Gastroenterology;  Laterality: N/A; 08/30/2018: CYSTOSCOPY W/ RETROGRADES; Right     Comment:  Procedure: CYSTOSCOPY WITH RETROGRADE PYELOGRAM;                Surgeon: Hollice Espy, MD;  Location: [facility] ORS;                Service: Urology;  Laterality: Right; 04/25/2018: CYSTOSCOPY WITH STENT PLACEMENT; Right     Comment:  Procedure: CYSTOSCOPY WITH STENT PLACEMENT;  Surgeon:               Hollice Espy, MD;  Location: [facility] ORS;  Service:               Urology;  Laterality: Right; 08/30/2018: CYSTOSCOPY WITH STENT PLACEMENT; Right     Comment:  Procedure: [place] Exchange;  Surgeon:               Hollice Espy, MD;  Location: [facility] ORS;  Service:               Urology;  Laterality: Right; 03/07/2019: CYSTOSCOPY WITH STENT PLACEMENT; Right     Comment:  Procedure: [place] Exchange;  Surgeon:               Hollice Espy, MD;  Location: [facility] ORS;  Service:  Urology;  Laterality: Right; 11/21/2019: CYSTOSCOPY WITH STENT PLACEMENT; Right     Comment:  Procedure: [place] Exchange;  Surgeon:               Hollice Espy, MD;  Location: [facility] ORS;  Service:               Urology;  Laterality: Right; 05/23/2019: LOWER EXTREMITY ANGIOGRAPHY; Left     Comment:  Procedure: LOWER EXTREMITY ANGIOGRAPHY;  Surgeon: Algernon Huxley, MD;  Location: [place] CV LAB;  Service:               Cardiovascular;  Laterality: Left; 05/30/2019: LOWER EXTREMITY ANGIOGRAPHY; Right     Comment:  Procedure: LOWER EXTREMITY ANGIOGRAPHY;  Surgeon: Algernon Huxley, MD;  Location: [place] CV LAB;  Service:               Cardiovascular;  Laterality: Right; 02/13/2020: LOWER EXTREMITY ANGIOGRAPHY; Right     Comment:  Procedure:  LOWER EXTREMITY ANGIOGRAPHY;  Surgeon: Algernon Huxley, MD;  Location: [place] CV LAB;  Service:               Cardiovascular;  Laterality: Right; 02/20/2020: LOWER EXTREMITY ANGIOGRAPHY; Left     Comment:  Procedure: LOWER EXTREMITY ANGIOGRAPHY;  Surgeon: Algernon Huxley, MD;  Location: [place] CV LAB;  Service:               Cardiovascular;  Laterality: Left; 02/28/2019: PORTA CATH INSERTION; N/A     Comment:  Procedure: PORTA CATH INSERTION;  Surgeon: Algernon Huxley,              MD;  Location: [place] CV LAB;  Service:               Cardiovascular;  Laterality: N/A; No date: tumor removed   BMI    Body Mass Index: 23.63 kg/m      Reproductive/Obstetrics negative OB ROS                             Anesthesia Physical Anesthesia Plan  ASA: 3  Anesthesia Plan: General   Post-op Pain Management:    Induction: Intravenous  PONV Risk Score and Plan: Propofol infusion and TIVA  Airway Management Planned: Natural Airway and Nasal Cannula  Additional Equipment:   Intra-op Plan:   Post-operative Plan:   Informed Consent: I have reviewed the patients History and Physical, chart, labs and discussed the procedure including the risks, benefits and alternatives for the proposed anesthesia with the patient or authorized representative who has indicated his/her understanding and acceptance.     Dental Advisory Given  Plan Discussed with: Anesthesiologist, CRNA and Surgeon  Anesthesia Plan Comments: (Patient consented for risks of anesthesia including but not limited to:  - adverse reactions to medications - risk of airway placement if required - damage to eyes, teeth, lips or other oral mucosa - nerve damage due to positioning  - sore throat or hoarseness - Damage to heart, brain, nerves, lungs, other parts of body or loss of life  Patient voiced understanding.)  Anesthesia Quick Evaluation

## 2021-07-31 NOTE — Anesthesia Procedure Notes (Signed)
Procedure Name: MAC Date/Time: 07/31/2021 8:39 AM Performed by: Lily Peer, Lawernce Earll, CRNA Pre-anesthesia Checklist: Patient identified, Emergency Drugs available, Suction available, Patient being monitored and Timeout performed Patient Re-evaluated:Patient Re-evaluated prior to induction Oxygen Delivery Method: Nasal cannula Induction Type: IV induction

## 2021-07-31 NOTE — H&P (Signed)
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  For repeat assessment. Of luminal integrity. Tolerated prep well.   Medications Prior to Admission  Medication Sig Dispense Refill Last Dose   acetaminophen (TYLENOL) 325 MG tablet Take 650 mg by mouth 3 (three) times daily.    Past Week   aspirin EC 81 MG tablet Take 1 tablet (81 mg total) by mouth daily. 150 tablet 2 07/30/2021   bisacodyl (DULCOLAX) 5 MG EC tablet    Past Week   carboxymethylcellulose 1 % ophthalmic solution Apply 1 drop to eye 2 (two) times daily.   Past Week   Cholecalciferol (VITAMIN D) 50 MCG (2000 UT) CAPS Take by mouth.   Past Week   Cholecalciferol 50 MCG (2000 UT) CAPS    Past Week   clopidogrel (PLAVIX) 75 MG tablet Take 1 tablet (75 mg total) by mouth daily. 30 tablet 11 Past Week   cyclobenzaprine (FLEXERIL) 10 MG tablet Take 10 mg by mouth at bedtime.   Past Week   doxycycline (VIBRA-TABS) 100 MG tablet Take 100 mg by mouth 2 (two) times daily.   Past Week   escitalopram (LEXAPRO) 5 MG tablet Take 5 mg by mouth daily.   Past Week   gabapentin (NEURONTIN) 100 MG capsule Take 100 mg by mouth 3 (three) times daily.   Past Week   mirtazapine (REMERON) 7.5 MG tablet Take 7.5 mg by mouth at bedtime.   Past Week   Oxycodone HCl 10 MG TABS Take 10 mg by mouth every 6 (six) hours as needed for pain.   Past Week   pantoprazole (PROTONIX) 40 MG tablet Take 40 mg by mouth daily.   Past Week   polyethylene glycol powder (GLYCOLAX/MIRALAX) 17 GM/SCOOP powder    Past Week   RESTORE CALCIUM ALGINATE EX Apply topically. Note: Dressing located on left ankle. Facility cleaning wound "every day shift and applying wound cleanser and appication of calcium Alginate and cover with dry dressing and wrapped with Kerlix"   Past Week   senna-docusate (SENOKOT-S) 8.6-50 MG tablet Take 1 tablet by mouth daily.   Past Week   tamsulosin (FLOMAX) 0.4 MG  CAPS capsule Take 0.4 mg by mouth daily.   Past Week   atorvastatin (LIPITOR) 10 MG tablet Take 1 tablet (10 mg total) by mouth daily. 30 tablet 11    Allergies  Allergen Reactions   Penicillins Rash   Past Medical History:  Diagnosis Date   Anemia    Anxiety    ARF (acute respiratory failure) (HCC)    Bladder cancer (HCC)    COPD (chronic obstructive pulmonary disease) ([place])    Depression    Dysphagia    Family history of colon cancer    Family history of kidney cancer    Family history of leukemia    Family history of prostate cancer    GERD (gastroesophageal reflux disease)    Hepatitis    chronic hep c   Hydronephrosis    Hydronephrosis with ureteral stricture    Hyperlipidemia    Knee pain    Left   Malignant neoplasm of colon (HCC)    Nerve pain    Peripheral vascular disease ([place])    Prostate cancer ([place])    Stroke ([place])    Ureteral cancer, right ([place])    Urinary frequency    Venous hypertension of both lower extremities    Past  Surgical History:  Procedure Laterality Date   COLON SURGERY     En bloc extended right hemicolectomy 07/2017   COLONOSCOPY WITH PROPOFOL N/A 11/06/2020   Procedure: COLONOSCOPY WITH PROPOFOL;  Surgeon: Jonathon Bellows, MD;  Location: [facility] ENDOSCOPY;  Service: Gastroenterology;  Laterality: N/A;   CYSTOSCOPY W/ RETROGRADES Right 08/30/2018   Procedure: CYSTOSCOPY WITH RETROGRADE PYELOGRAM;  Surgeon: Hollice Espy, MD;  Location: [facility] ORS;  Service: Urology;  Laterality: Right;   CYSTOSCOPY WITH STENT PLACEMENT Right 04/25/2018   Procedure: CYSTOSCOPY WITH STENT PLACEMENT;  Surgeon: Hollice Espy, MD;  Location: [facility] ORS;  Service: Urology;  Laterality: Right;   CYSTOSCOPY WITH STENT PLACEMENT Right 08/30/2018   Procedure: [place] Exchange;  Surgeon: Hollice Espy, MD;  Location: [facility] ORS;  Service: Urology;  Laterality: Right;   CYSTOSCOPY WITH STENT PLACEMENT Right 03/07/2019   Procedure: CYSTOSCOPY WITH STENT Exchange;   Surgeon: Hollice Espy, MD;  Location: [facility] ORS;  Service: Urology;  Laterality: Right;   CYSTOSCOPY WITH STENT PLACEMENT Right 11/21/2019   Procedure: CYSTOSCOPY WITH STENT Exchange;  Surgeon: Hollice Espy, MD;  Location: [facility] ORS;  Service: Urology;  Laterality: Right;   LOWER EXTREMITY ANGIOGRAPHY Left 05/23/2019   Procedure: LOWER EXTREMITY ANGIOGRAPHY;  Surgeon: Algernon Huxley, MD;  Location: [place] CV LAB;  Service: Cardiovascular;  Laterality: Left;   LOWER EXTREMITY ANGIOGRAPHY Right 05/30/2019   Procedure: LOWER EXTREMITY ANGIOGRAPHY;  Surgeon: Algernon Huxley, MD;  Location: [place] CV LAB;  Service: Cardiovascular;  Laterality: Right;   LOWER EXTREMITY ANGIOGRAPHY Right 02/13/2020   Procedure: LOWER EXTREMITY ANGIOGRAPHY;  Surgeon: Algernon Huxley, MD;  Location: [place] CV LAB;  Service: Cardiovascular;  Laterality: Right;   LOWER EXTREMITY ANGIOGRAPHY Left 02/20/2020   Procedure: LOWER EXTREMITY ANGIOGRAPHY;  Surgeon: Algernon Huxley, MD;  Location: [place] CV LAB;  Service: Cardiovascular;  Laterality: Left;   PORTA CATH INSERTION N/A 02/28/2019   Procedure: PORTA CATH INSERTION;  Surgeon: Algernon Huxley, MD;  Location: [place] CV LAB;  Service: Cardiovascular;  Laterality: N/A;   tumor removed      Social History   Socioeconomic History   Marital status: Single    Spouse name: Not on file   Number of children: Not on file   Years of education: Not on file   Highest education level: Not on file  Occupational History   Not on file  Tobacco Use   Smoking status: Every Day    Packs/day: 1.00    Types: Cigarettes   Smokeless tobacco: Never  Vaping Use   Vaping Use: Never used  Substance and Sexual Activity   Alcohol use: Not Currently   Drug use: Not Currently   Sexual activity: Not Currently  Other Topics Concern   Not on file  Social History Narrative    used to live [place]; moved  To [place]- end of April 2019; in Nursing home; 1pp/day; quit alcohol. 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        Family- dad- prostate ca [at 38y]; brother- 76 died of prostate cancer; brother- 49- no cancers [[place]]; sonGerald Stabs [[place]];[name]-32y prostate ca [facility] [place]]; daughter- 45 [[place]]; another daughter 43 [NM/addict]. will refer genetics counseling. Given MSI- abnormal; highly suspicious of Lynch syndrome.  Patient's son [name] aware of high possible lynch syndrome.   Social Determinants of Health   Financial Resource Strain: Not on file  Food Insecurity: Not on file  Transportation Needs: Not on file  Physical  Activity: Not on file  Stress: Not on file  Social Connections: Not on file  Intimate Partner Violence: Not on file   Social History   Social History Narrative    used to live [place]; moved  To [place]- end of April 2019; in Nursing home; 1pp/day; quit alcohol. 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        Family- dad- prostate ca [at 47y]; brother- 87 died of prostate cancer; brother- 36- no cancers [[place]]; sonGerald Stabs [[place]];[name]-32y prostate ca [facility] [place]]; daughter- 49 [[place]]; another daughter 98 [NM/addict]. will refer genetics counseling. Given MSI- abnormal; highly suspicious of Lynch syndrome.  Patient's son [name] aware of high possible lynch syndrome.     ROS: Negative.     PE: HEENT: Negative. Lungs: Clear. Cardio: RR.  Assessment/Plan:  Proceed with planned endoscopy.  Forest Gleason [facility] 07/31/2021

## 2021-07-31 NOTE — Transfer of Care (Signed)
Immediate Anesthesia Transfer of Care Note  Patient: Chad [name]  Procedure(s) Performed: COLONOSCOPY WITH PROPOFOL  Patient Location: Endoscopy Unit  Anesthesia Type:General  Level of Consciousness: sedated  Airway & Oxygen Therapy: Patient Spontanous Breathing and Patient connected to face mask oxygen  Post-op Assessment: Report given to RN and Post -op Vital signs reviewed and stable  Post vital signs: Reviewed and stable  Last Vitals:  Vitals Value Taken Time  BP 100/60   Temp    Pulse 76   Resp 10 07/31/21 0907  SpO2 94   Vitals shown include unvalidated device data.  Last Pain:  Vitals:   07/31/21 0803  TempSrc: Temporal  PainSc: 0-No pain         Complications: No notable events documented.

## 2021-08-01 ENCOUNTER — Encounter: Payer: Self-pay | Admitting: General Surgery

## 2021-08-01 ENCOUNTER — Encounter: Payer: Medicaid Other | Attending: Physician Assistant | Admitting: Physician Assistant

## 2021-08-01 DIAGNOSIS — L97322 Non-pressure chronic ulcer of left ankle with fat layer exposed: Secondary | ICD-10-CM | POA: Insufficient documentation

## 2021-08-01 DIAGNOSIS — I69354 Hemiplegia and hemiparesis following cerebral infarction affecting left non-dominant side: Secondary | ICD-10-CM | POA: Insufficient documentation

## 2021-08-01 DIAGNOSIS — L89894 Pressure ulcer of other site, stage 4: Secondary | ICD-10-CM | POA: Insufficient documentation

## 2021-08-01 DIAGNOSIS — I872 Venous insufficiency (chronic) (peripheral): Secondary | ICD-10-CM | POA: Diagnosis present

## 2021-08-01 LAB — SURGICAL PATHOLOGY

## 2021-08-01 NOTE — Progress Notes (Addendum)
DEEP, BONAWITZ ([id number]) Visit Report for 08/01/2021 Chief Complaint Document Details Patient Name: Chad [name], Chad [name] Date of Service: 08/01/2021 9:00 AM Medical Record Number: [id number] Patient Account Number: [id number] Date of Birth/Sex: 1960/07/31 (61 y.o. M) Treating RN: [name] Primary Care Provider: SYSTEM, PCP Other Clinician: Referring Provider: Clayborn Bigness Treating Provider/Extender: Skipper Cliche in Treatment: 20 Information Obtained from: Patient Chief Complaint Left ankle and right foot ulcers Electronic Signature(s) Signed: 08/01/2021 8:49:18 AM By: Worthy Keeler PA-C Entered By: Worthy Keeler on 08/01/2021 08:49:18 Chad [name] ([id number]) -------------------------------------------------------------------------------- HPI Details Patient Name: Chad [name] Date of Service: 08/01/2021 9:00 AM Medical Record Number: [id number] Patient Account Number: [id number] Date of Birth/Sex: 07/11/1961 (60 y.o. M) Treating RN: [name] Primary Care Provider: SYSTEM, PCP Other Clinician: Referring Provider: Clayborn Bigness Treating Provider/Extender: Skipper Cliche in Treatment: 90 History of Present Illness HPI Description: 10/08/18 on evaluation today patient actually presents to our office for initial evaluation concerning wounds that he has of the bilateral lower extremities. 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 He also has bilateral venous stasis. He apparently has been homeless currently following discharge from the hospital apparently he has been placed at [facility] which is is a skilled nursing facility locally. Nonetheless fortunately he does not show any signs of infection at this time which is good news. In fact several of the wound actually appears to be showing some signs of  improvement already in my pinion. There are a couple areas in the left leg in particular there likely gonna require some sharp debridement to help clear away some necrotic tissue and help with more sufficient healing. No fevers, chills, nausea, or vomiting noted at this time. 10/15/18 on evaluation today patient actually appears to be doing very well in regard to his bilateral lower extremities. He's been tolerating the dressing changes without complication. Fortunately there does not appear to be any evidence of active infection at this time which is great news. Overall I'm actually very pleased with how this has progressed in just one visits time. Readmission: 08/14/2020 upon evaluation today patient presents for re-evaluation here in our clinic. He is having issues with his left ankle region as well as his right toe and his right heel. He tells me that the toe and heel actually began as a area that was itching that he was scratching and then subsequently opened up into wounds. These may have been abscess areas I presume based on what I am seeing currently. With regard to his left ankle region he tells me this was a similar type occurrence although he does have venous stasis this very well may be more of a venous leg ulcer more than anything. Nonetheless I do believe that the patient would benefit from appropriate and aggressive wound care to try to help get things under better control here. He does have history of a stroke on the left side affecting him to some degree there that he is able to stand although he does have some residual weakness. Otherwise again the patient does have chronic venous insufficiency as previously noted. His arterial studies most recently obtained showed that he had an ABI on the right of 1.16 with a TBI of 0.52 and on the left and ABI of 1.14 with a TBI of 0.81. That was obtained on 06/19/2020. 08/28/2020 upon evaluation today patient appears  to be doing decently well in  regard to his wounds in general. He has been tolerating the dressing changes without complication. Fortunately there does not appear to be any signs of active infection which is great news. With that being said I think the [facility] is doing a good job I would recommend that we likely continue with that currently. 09/11/2020 upon evaluation today patient's wounds did not appear to be doing too poorly but again he is not really showing signs of significant improvement with regard to any of the wounds on the right. None of them have Hydrofera Blue on them I am not exactly sure why this is not being followed as the facility did not contact us to let us know of any issues with obtaining dressings or otherwise. With that being said he is supposed to be using Hydrofera Blue on both of the wounds on the right foot as well as the ankle wound on the left side. 09/18/2020 upon evaluation today patient appears to be doing poorly with regard to his wounds. Again right now the left ankle in particular showed signs of extreme maceration. Apparently he was told by someone with staff at [place] they could not get the [facility]. With that being said this is something that is never been relayed to [place] one way or another. Also the patient subsequently has not supposed to have a border gauze dressing on. He should have an ABD pad and roll gauze to secure as this drains much too much just to have a border gauze dressing to cover. Nonetheless the fact that they are not using the appropriate dressing is directly causing deterioration of the left ankle wound it is significantly worse today compared to what it was previous. I did attempt to call [facility] while the patient was here I called three times and got no one to even pick up the phone. After this I had my for an office coordinator call and she was able to finally get through and leave a message with the D ON as of dictation of this note  which is roughly about an hour and a half later I still have not been able to speak with anyone at the facility. 09/25/2020 upon evaluation today patient actually showing signs of good improvement which is excellent news. He has been tolerating the dressing changes without complication. Fortunately there is no signs of active infection which is great news. No fevers, chills, nausea, vomiting, or diarrhea. I do feel like the facility has been doing a much better job at taking care of him as far as the dressings are concerned. However the director of nursing never did call me back. 10/09/2020 upon evaluation today patient appears to be doing well with regard to his wound. The toe ulcer did require some debridement but the other 2 areas actually appear to be doing quite well. 10/19/2020 upon evaluation today patient actually appears to be doing very well in regard to his wounds. In fact the heel does appear to be completely healed. The toe is doing better in the medial ankle on the left is also doing better. Overall I think he is headed in the right direction. 10/26/2020 upon evaluation today patient appears to be doing well with regard to his wound. He is showing signs of improvement which is great news and overall I am very pleased with where things stand today. No fevers, chills, nausea, vomiting, or diarrhea. 11/02/2020 upon evaluation today patient appears to be doing  well with regard to his wounds. He has been tolerating the dressing changes without complication overall I am extremely pleased with where things stand today. He in regard to the toe is almost completely healed and the medial ankle on the left is doing much better. 11/09/2020 upon evaluation today patient appears to be doing a little poorly in regard to his left medial ankle ulcer. Fortunately there does not appear to be any signs of systemic infection but unfortunately locally he does appear to be infected in fact he has blue-green drainage  consistent with Pseudomonas. MAVERICK, DIEUDONNE ([id number]) 11/16/2020 upon evaluation today patient appears to be doing well with regard to his wound. It actually appears to be doing better. I did place him on gentamicin cream since the Cipro was actually resistant even though he was positive for Pseudomonas on culture. Overall I think that he does seem to be doing better though I am unsure whether or not they have actually been putting the cream on. The patient is not sure that we did talk to the nurse directly and she was going to initiate that treatment. Fortunately there does not appear to be any signs of active infection at this time. No fevers, chills, nausea, vomiting, or diarrhea. 4/28; the area on the right second toe is close to healed. Left medial ankle required debridement 12/07/2020 upon evaluation today patient appears to be doing well with regard to his wounds. In fact the right second toe appears to be completely healed which is great news. Fortunately there does not appear to be any signs of active infection at this time which is also great news. I think we can probably discontinue the gentamicin on top of everything else. 12/14/2020 upon evaluation today patient appears to be doing well with regard to his wound. He is making good progress and overall very pleased with where things stand today. There is no signs of active infection at this time which is great news. 12/28/2020 upon evaluation today patient appears to be doing well with regard to his wounds. He has been tolerating the dressing changes without complication. Fortunately there is no signs of active infection at this time. No fevers, chills, nausea, vomiting, or diarrhea. 12/28/2020 upon evaluation today patient's wound bed actually showed signs of excellent improvement. He has great epithelization and granulation I do not see any signs of infection overall I am extremely pleased with where things stand at this point. No fevers,  chills, nausea, vomiting, or diarrhea. 01/11/2021 upon evaluation today patient appears to be doing well with regard to his wound on his leg. He has been tolerating the dressing changes without complication. Fortunately there does not appear to be any signs of active infection which is great news. No fevers, chills, nausea, vomiting, or diarrhea. 01/25/2021 upon evaluation today patient appears to be doing well with regard to his wound. He has been tolerating the dressing changes without complication. Fortunately the collagen seems to be doing a great job which is excellent news. No fevers, chills, nausea, vomiting, or diarrhea. 02/08/2021 upon evaluation today patient's wound is actually looking a little bit worse especially in the periwound compared to previous. Fortunately there does not appear to be any signs of infection which is great news with that being said he does have some irritation around the periphery of the wound which has me more concerned. He actually had a dressing on that had not been changed in 3 days. He also is supposed to have daily dressing changes. With regard  to the dressing applied he had a silver alginate dressing and silver collagen is what is recommended and ordered. He also had no Desitin around the edges of the wound in the periwound region although that is on the order inspect to be done as well. In general I was very concerned I did contact [facility] actually spoke with Magda Paganini who is the scheduling individual and subsequently she stated that she would pass the information to the D ON apparently the D ON was not available to talk to me when I call today. 02/18/2021 upon evaluation today patient's wound is actually showing signs of improvement. Fortunately there does not appear to be any evidence of infection which is great news overall I am extremely pleased with where things stand today. No fevers, chills, nausea, vomiting, or diarrhea. 8/3; patient presents for  1 week follow-up. He has no issues or complaints today. He denies signs of infection. 03/11/2021 upon evaluation today patient appears to be doing well with regard to his wound. He does have a little bit of slough noted on the surface of the wound but fortunately there does not appear to be any signs of active infection at this time. No fevers, chills, nausea, vomiting, or diarrhea. 03/18/2021 upon evaluation today patient appears to be doing well with regard to his wound. He has been tolerating the dressing changes without complication. There was a little irritation more proximal to where the wound was that was not noted last week but nonetheless this is very superficial just seems to be more irritation we just need to make sure to put a good amount of the zinc over the area in my opinion. Otherwise he does not seem to be doing significantly worse at all which is great news. 03/25/2021 upon evaluation today patient appears to be doing well with regard to his wound. He is going require some sharp debridement today to clear with some of the necrotic debris. I did perform this today without complication postdebridement wound bed appears to be doing much better this is great news. 04/08/2021 upon evaluation today patient appears to be doing decently well in regard to his wound although the overall measurement is not significantly smaller compared to previous. It is gone down a little bit but still the facility continues to not really put the appropriate dressings in place in fact he was supposed to have collagen we think he probably had more of an allergy to At this point. Fortunately there does not appear to be any signs of active infection systemically though locally I do not see anything on initial visualization either as far as erythema or warmth. 04/15/2021 upon evaluation today patient appears to be doing well with regard to his wound. He is actually showing signs of improvement. I did place him on  antibiotics last week, Cipro. He has been taking that 2 times a day and seems to be tolerating it very well. I do not see any evidence of worsening and in fact the overall appearance of the wound is smaller today which is also great news. 9/26; left medial ankle chronic venous insufficiency wound is improved. Using Hydrofera Blue 10/10; left medial ankle chronic venous insufficiency. Wound has not changed much in appearance completely nonviable surface. Apparently there have been problems getting the right product on the wound at the facility although he came in with [facility] on today 05/14/2021 upon evaluation today patient appears to be doing well with regard to his wound. I think he is making  progress here which is good news. Fortunately there does not appear to be any signs of active infection at this time. No fevers, chills, nausea, vomiting, or diarrhea. 05/20/2021 upon evaluation today patient appears to be doing well with regard to his wound. He is showing signs of good improvement which is great news. There does not appear to be any evidence of active infection which is also excellent news. No fevers, chills, nausea, vomiting, or diarrhea. 05/28/2021 upon evaluation today patient appears to be doing quite well. There does not appear to be any signs of active infection at this time which is great news. Overall I am extremely pleased with where things stand today. I think he is headed in the right direction. 06/11/2021 upon evaluation today patient appears to be doing well with regard to his left ankle ulcer and poorly in regard to the toe ulcer on the second toe right foot. This appears to show signs of joint exposure. Apparently this has been present for 1 to 2 months although he kept [facility] ([id number]) forgetting to tell me about it. That is unfortunate as right now it definitely appears to be doing significantly worse than what I would like to see. There does not appear to  be any signs of active infection systemically though locally I am concerned about the possibility of infection the toe is quite red. Again no one from the facility ever contacted [place] to advise that this was going on in the interim either. 06/17/2021 upon evaluation today patient presents for follow-up I did review his x-ray which showed a navicular bone fracture I am unsure of the chronicity of this. Subsequently he also had osteomyelitis of the toe which was what I was more concerned about this did not show up on x-ray but did show up on the pathology scrapings. This was listed as acute osteomyelitis. Nonetheless at this point I think that the antibiotic treatment is the best regimen to go with currently. The patient is in agreement with that plan. Nonetheless he has initially 30 days of doxycycline off likely extend that towards the end of the treatment cycle that will be around the middle of December for an additional 2 weeks. That all depends on how well he continues to heal. Nonetheless based on what I am seeing in the foot I did want a proceed with an MRI as well which I think will be helpful to identify if there is anything else that needs to be addressed from the standpoint of infection. 06/24/2021 upon evaluation today patient appears to be doing pretty well in regards to his wounds. I think both are actually showing signs of improvement which is good I did review his MRI today which did show signs of osteomyelitis of the middle and proximal phalanx on his right foot of the affected toe. With that being said this is actually showing signs of significant improvement today already with the antibiotic therapy I think the redness is also improved. Overall I think that we just need to give this some time with appropriate wound care we will see how things go potentially hyperbarics could be considered. 07/02/2021 upon inspection today patient actually appears to be doing well in regard to his left  ankle which is getting very close to complete resolution of pleased in that regard. Unfortunately he is continuing to have issues with his second toe right foot and this seems to still be very painful for him. Recommend he try something different from the standpoint of antibiotics.  07/15/2021 upon evaluation today patient appears to be doing actually pretty well in regard to his foot. This is actually showing signs of significant improvement which is great news. Overall I feel like the patient is improving both in regard to the second toe as well as the ankle on the left. With that being said the biggest issue that I do see currently is that he is needing to have a refill of the doxycycline that we previously treated him with. He also did see podiatry they are not going to recommend any amputation at this point since he seems to be doing quite well. For that reason we just need to keep things under control from an infection standpoint. 08/01/2021 upon evaluation today patient appears to be doing well with regard to his wound. He has been tolerating the dressing changes without complication. Fortunately there does not appear to be any evidence of active infection locally nor systemically at this point. In fact I think everything is doing excellent in fact his second toe on the right foot is almost healed and the ankle on the left ankle region is actually very close to being healed as well. Electronic Signature(s) Signed: 08/01/2021 10:17:17 AM By: Worthy Keeler PA-C Entered By: Worthy Keeler on 08/01/2021 10:17:17 Chad [name] ([id number]) -------------------------------------------------------------------------------- Physical Exam Details Patient Name: Chad [name] Date of Service: 08/01/2021 9:00 AM Medical Record Number: [id number] Patient Account Number: [id number] Date of Birth/Sex: 08-Nov-1960 (60 y.o. M) Treating RN: [name] Primary Care Provider: SYSTEM, PCP Other  Clinician: Referring Provider: Clayborn Bigness Treating Provider/Extender: Skipper Cliche in Treatment: 37 Constitutional Well-nourished and well-hydrated in no acute distress. Respiratory normal breathing without difficulty. Psychiatric this patient is able to make decisions and demonstrates good insight into disease process. Alert and Oriented x 3. pleasant and cooperative. Notes Patient's toenail region has completely resolved on the second toe right foot which is great news. The actual wound itself also appears to be pretty much resolved I Minna monitor for 1 more week before completely closing this out but I think it may be healed by next week. With regard to the left ankle region this is significantly smaller think the [facility] is doing well here. Electronic Signature(s) Signed: 08/01/2021 10:17:41 AM By: Worthy Keeler PA-C Entered By: Worthy Keeler on 08/01/2021 10:17:40 Chad [name] ([id number]) -------------------------------------------------------------------------------- Physician Orders Details Patient Name: Chad [name] Date of Service: 08/01/2021 9:00 AM Medical Record Number: [id number] Patient Account Number: [id number] Date of Birth/Sex: 01-27-1961 (60 y.o. 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 o Nurse Visit as needed Bathing/ Shower/ Hygiene o May shower; gently cleanse wound with antibacterial soap, rinse and pat  dry prior to dressing wounds Edema Control - Lymphedema / Segmental Compressive Device / Other o Elevate legs to the level of the heart and pump ankles as often as possible o Elevate leg(s) parallel to the floor when sitting. Non-Wound Condition o Additional non-wound orders/instructions: - patient needs toenails clipped asap Wound Treatment Wound #8 - Ankle Wound Laterality: Left, Medial Cleanser: Normal Saline 3 x Per Week/30 Days Discharge  Instructions: Wash your hands with soap and water. Remove old dressing, discard into plastic bag and place into trash. Cleanse the wound with Normal Saline prior to applying a clean dressing using gauze sponges, not tissues or cotton balls. Do not scrub or use excessive force. Pat dry using gauze sponges, not tissue or cotton balls. Peri-Wound Care: Desitin Maximum Strength Ointment 4 (oz) 3 x Per Week/30 Days Discharge Instructions: Apply zinc periwound Primary Dressing: Hydrofera Blue Ready Transfer Foam, 2.5x2.5 (in/in) 3 x Per Week/30 Days Discharge Instructions: Apply Hydrofera Blue Ready to wound bed as directed (EXTRA SENT WITH PATIENT, PLEASE USE) Secondary Dressing: Kerlix 4.5 x 4.1 (in/yd) 3 x Per Week/30 Days Discharge Instructions: Apply kerlix over ABD pad Secured With: 56M Medipore H Soft Cloth Surgical Tape, 2x2 (in/yd) 3 x Per Week/30 Days Discharge Instructions: Secure kerlix Wound #9 - Toe Second Wound Laterality: Right Cleanser: Normal Saline 3 x Per Week/30 Days Discharge Instructions: Wash your hands with soap and water. Remove old dressing, discard into plastic bag and place into trash. Cleanse the wound with Normal Saline prior to applying a clean dressing using gauze sponges, not tissues or cotton balls. Do not scrub or use excessive force. Pat dry using gauze sponges, not tissue or cotton balls. Primary Dressing: Xeroform-HBD 2x2 (in/in) 3 x Per Week/30 Days Discharge Instructions: Apply Xeroform-HBD 2x2 (in/in) as  directed Secondary Dressing: Gauze 3 x Per Week/30 Days Discharge Instructions: As directed: dry, moistened with saline or moistened with Dakins Solution Secured With: 56M Medipore H Soft Cloth Surgical Tape, 2x2 (in/yd) 3 x Per Week/30 Days LESHAWN, STRAKA ([id number]) Discharge Instructions: Secure kerlix Electronic Signature(s) Signed: 08/02/2021 5:21:10 PM By: Worthy Keeler PA-C Signed: 08/05/2021 9:27:28 AM By: [name] Entered By: [name] on 08/01/2021 09:59:52 Chad [name] ([id number]) -------------------------------------------------------------------------------- Problem List Details Patient Name: Chad [name] Date of Service: 08/01/2021 9:00 AM Medical Record Number: [id number] Patient Account Number: [id number] Date of Birth/Sex: 06/17/1961 (61 y.o. 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 08/14/2020 No Yes non-dominant side Inactive Problems ICD-10 Code Description Active Date Inactive Date L97.412 Non-pressure chronic ulcer of right heel and midfoot with fat layer exposed 08/14/2020 08/14/2020 L97.512 Non-pressure chronic ulcer of other part of right foot with fat layer exposed 08/14/2020 08/14/2020 Resolved Problems Electronic Signature(s) Signed: 08/01/2021 8:48:21 AM By: Worthy Keeler PA-C Entered By: Worthy Keeler on 08/01/2021 08:48:20 Chad [name]  ([id number]) -------------------------------------------------------------------------------- Progress Note Details Patient Name: Chad [name] Date of Service: 08/01/2021 9:00 AM Medical Record Number: [id number] Patient Account Number: [id number] Date of Birth/Sex: 1961/03/14 (60 y.o. M) Treating RN: [name] Primary Care Provider: SYSTEM, PCP Other Clinician: Referring Provider: Clayborn Bigness Treating Provider/Extender: Skipper Cliche in Treatment: 26 Subjective Chief Complaint Information obtained from Patient Left ankle and right foot ulcers History of Present Illness (HPI) 10/08/18 on evaluation today patient actually presents to our office for initial evaluation concerning wounds that he has of the bilateral lower extremities. 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 He also has bilateral venous stasis. He apparently has been homeless currently following discharge from the hospital apparently he has been placed at [facility] which is is a skilled nursing facility locally. Nonetheless fortunately he does not show any signs of infection at this time which is good news. In fact several of the wound actually appears to be showing some signs of improvement already in my pinion. There are a couple areas in the left leg in particular there likely gonna require some sharp debridement to help clear away some necrotic tissue and help with more sufficient healing. No fevers, chills, nausea, or vomiting noted at this time. 10/15/18 on evaluation today patient actually appears to be doing very well in regard to his bilateral lower extremities. He's been tolerating the dressing changes without complication. Fortunately there does not appear to be any evidence of active infection at this time which is great news. Overall I'm  actually very pleased with how this has progressed in just one visits time. Readmission: 08/14/2020 upon evaluation today patient presents for re-evaluation here in our clinic. He is having issues with his left ankle region as well as his right toe and his right heel. He tells me that the toe and heel actually began as a area that was itching that he was scratching and then subsequently opened up into wounds. These may have been abscess areas I presume based on what I am seeing currently. With regard to his left ankle region he tells me this was a similar type occurrence although he does have venous stasis this very well may be more of a venous leg ulcer more than anything. Nonetheless I do believe that the patient would benefit from appropriate and aggressive wound care to try to help get things under better control here. He does have history of a stroke on the left side affecting him to some degree there that he is able to stand although he does have some residual weakness. Otherwise again the patient does have chronic venous insufficiency as previously noted. His arterial studies most recently obtained showed that he had an ABI on the right of 1.16 with a TBI of 0.52 and on the left and ABI of 1.14 with a TBI of 0.81. That was obtained on 06/19/2020. 08/28/2020 upon evaluation today patient appears to be doing decently well in regard to his wounds in general. He has been tolerating the dressing changes without complication. Fortunately there does not appear to be any signs of active infection which is great news. With that being said I think the [facility] is doing a good job I would recommend that we likely continue with that currently. 09/11/2020 upon evaluation today patient's wounds did not appear to be doing too poorly but again he is not really showing signs of significant improvement with regard to any of the wounds on the right. None of them have Hydrofera Blue on them I am not exactly sure  why this is not being followed as the facility did not contact us to let us know of any issues with obtaining dressings or otherwise. With that being said he is supposed to be using Hydrofera Blue on both of the wounds on the right foot as well as the ankle wound on the left side. 09/18/2020 upon evaluation today patient appears to be doing poorly with regard to his wounds. Again right now the left ankle in particular showed signs of extreme maceration. Apparently he was told  by someone with staff at [facility] they could not get the [facility]. With that being said this is something that is never been relayed to [place] one way or another. Also the patient subsequently has not supposed to have a border gauze dressing on. He should have an ABD pad and roll gauze to secure as this drains much too much just to have a border gauze dressing to cover. Nonetheless the fact that they are not using the appropriate dressing is directly causing deterioration of the left ankle wound it is significantly worse today compared to what it was previous. I did attempt to call [facility] while the patient was here I called three times and got no one to even pick up the phone. After this I had my for an office coordinator call and she was able to finally get through and leave a message with the D ON as of dictation of this note which is roughly about an hour and a half later I still have not been able to speak with anyone at the facility. 09/25/2020 upon evaluation today patient actually showing signs of good improvement which is excellent news. He has been tolerating the dressing changes without complication. Fortunately there is no signs of active infection which is great news. No fevers, chills, nausea, vomiting, or diarrhea. I do feel like the facility has been doing a much better job at taking care of him as far as the dressings are concerned. However the director of nursing never did call me  back. 10/09/2020 upon evaluation today patient appears to be doing well with regard to his wound. The toe ulcer did require some debridement but the other 2 areas actually appear to be doing quite well. 10/19/2020 upon evaluation today patient actually appears to be doing very well in regard to his wounds. In fact the heel does appear to be completely healed. The toe is doing better in the medial ankle on the left is also doing better. Overall I think he is headed in the right direction. 10/26/2020 upon evaluation today patient appears to be doing well with regard to his wound. He is showing signs of improvement which is great news and overall I am very pleased with where things stand today. No fevers, chills, nausea, vomiting, or diarrhea. 11/02/2020 upon evaluation today patient appears to be doing well with regard to his wounds. He has been tolerating the dressing changes without complication overall I am extremely pleased with where things stand today. He in regard to the toe is almost completely healed and the medial [facility] ([id number]) ankle on the left is doing much better. 11/09/2020 upon evaluation today patient appears to be doing a little poorly in regard to his left medial ankle ulcer. Fortunately there does not appear to be any signs of systemic infection but unfortunately locally he does appear to be infected in fact he has blue-green drainage consistent with Pseudomonas. 11/16/2020 upon evaluation today patient appears to be doing well with regard to his wound. It actually appears to be doing better. I did place him on gentamicin cream since the Cipro was actually resistant even though he was positive for Pseudomonas on culture. Overall I think that he does seem to be doing better though I am unsure whether or not they have actually been putting the cream on. The patient is not sure that we did talk to the nurse directly and she was going to initiate that treatment. Fortunately there  does not appear to be  any signs of active infection at this time. No fevers, chills, nausea, vomiting, or diarrhea. 4/28; the area on the right second toe is close to healed. Left medial ankle required debridement 12/07/2020 upon evaluation today patient appears to be doing well with regard to his wounds. In fact the right second toe appears to be completely healed which is great news. Fortunately there does not appear to be any signs of active infection at this time which is also great news. I think we can probably discontinue the gentamicin on top of everything else. 12/14/2020 upon evaluation today patient appears to be doing well with regard to his wound. He is making good progress and overall very pleased with where things stand today. There is no signs of active infection at this time which is great news. 12/28/2020 upon evaluation today patient appears to be doing well with regard to his wounds. He has been tolerating the dressing changes without complication. Fortunately there is no signs of active infection at this time. No fevers, chills, nausea, vomiting, or diarrhea. 12/28/2020 upon evaluation today patient's wound bed actually showed signs of excellent improvement. He has great epithelization and granulation I do not see any signs of infection overall I am extremely pleased with where things stand at this point. No fevers, chills, nausea, vomiting, or diarrhea. 01/11/2021 upon evaluation today patient appears to be doing well with regard to his wound on his leg. He has been tolerating the dressing changes without complication. Fortunately there does not appear to be any signs of active infection which is great news. No fevers, chills, nausea, vomiting, or diarrhea. 01/25/2021 upon evaluation today patient appears to be doing well with regard to his wound. He has been tolerating the dressing changes without complication. Fortunately the collagen seems to be doing a great job which is excellent  news. No fevers, chills, nausea, vomiting, or diarrhea. 02/08/2021 upon evaluation today patient's wound is actually looking a little bit worse especially in the periwound compared to previous. Fortunately there does not appear to be any signs of infection which is great news with that being said he does have some irritation around the periphery of the wound which has me more concerned. He actually had a dressing on that had not been changed in 3 days. He also is supposed to have daily dressing changes. With regard to the dressing applied he had a silver alginate dressing and silver collagen is what is recommended and ordered. He also had no Desitin around the edges of the wound in the periwound region although that is on the order inspect to be done as well. In general I was very concerned I did contact [facility] actually spoke with Magda Paganini who is the scheduling individual and subsequently she stated that she would pass the information to the D ON apparently the D ON was not available to talk to me when I call today. 02/18/2021 upon evaluation today patient's wound is actually showing signs of improvement. Fortunately there does not appear to be any evidence of infection which is great news overall I am extremely pleased with where things stand today. No fevers, chills, nausea, vomiting, or diarrhea. 8/3; patient presents for 1 week follow-up. He has no issues or complaints today. He denies signs of infection. 03/11/2021 upon evaluation today patient appears to be doing well with regard to his wound. He does have a little bit of slough noted on the surface of the wound but fortunately there does not appear to be any signs  of active infection at this time. No fevers, chills, nausea, vomiting, or diarrhea. 03/18/2021 upon evaluation today patient appears to be doing well with regard to his wound. He has been tolerating the dressing changes without complication. There was a little irritation more  proximal to where the wound was that was not noted last week but nonetheless this is very superficial just seems to be more irritation we just need to make sure to put a good amount of the zinc over the area in my opinion. Otherwise he does not seem to be doing significantly worse at all which is great news. 03/25/2021 upon evaluation today patient appears to be doing well with regard to his wound. He is going require some sharp debridement today to clear with some of the necrotic debris. I did perform this today without complication postdebridement wound bed appears to be doing much better this is great news. 04/08/2021 upon evaluation today patient appears to be doing decently well in regard to his wound although the overall measurement is not significantly smaller compared to previous. It is gone down a little bit but still the facility continues to not really put the appropriate dressings in place in fact he was supposed to have collagen we think he probably had more of an allergy to At this point. Fortunately there does not appear to be any signs of active infection systemically though locally I do not see anything on initial visualization either as far as erythema or warmth. 04/15/2021 upon evaluation today patient appears to be doing well with regard to his wound. He is actually showing signs of improvement. I did place him on antibiotics last week, Cipro. He has been taking that 2 times a day and seems to be tolerating it very well. I do not see any evidence of worsening and in fact the overall appearance of the wound is smaller today which is also great news. 9/26; left medial ankle chronic venous insufficiency wound is improved. Using Hydrofera Blue 10/10; left medial ankle chronic venous insufficiency. Wound has not changed much in appearance completely nonviable surface. Apparently there have been problems getting the right product on the wound at the facility although he came in with  [facility] on today 05/14/2021 upon evaluation today patient appears to be doing well with regard to his wound. I think he is making progress here which is good news. Fortunately there does not appear to be any signs of active infection at this time. No fevers, chills, nausea, vomiting, or diarrhea. 05/20/2021 upon evaluation today patient appears to be doing well with regard to his wound. He is showing signs of good improvement which is great news. There does not appear to be any evidence of active infection which is also excellent news. No fevers, chills, nausea, vomiting, or diarrhea. NICOLI, NARDOZZI ([id number]) 05/28/2021 upon evaluation today patient appears to be doing quite well. There does not appear to be any signs of active infection at this time which is great news. Overall I am extremely pleased with where things stand today. I think he is headed in the right direction. 06/11/2021 upon evaluation today patient appears to be doing well with regard to his left ankle ulcer and poorly in regard to the toe ulcer on the second toe right foot. This appears to show signs of joint exposure. Apparently this has been present for 1 to 2 months although he kept forgetting to tell me about it. That is unfortunate as right now it definitely appears to be doing  significantly worse than what I would like to see. There does not appear to be any signs of active infection systemically though locally I am concerned about the possibility of infection the toe is quite red. Again no one from the facility ever contacted [place] to advise that this was going on in the interim either. 06/17/2021 upon evaluation today patient presents for follow-up I did review his x-ray which showed a navicular bone fracture I am unsure of the chronicity of this. Subsequently he also had osteomyelitis of the toe which was what I was more concerned about this did not show up on x-ray but did show up on the pathology scrapings. This  was listed as acute osteomyelitis. Nonetheless at this point I think that the antibiotic treatment is the best regimen to go with currently. The patient is in agreement with that plan. Nonetheless he has initially 30 days of doxycycline off likely extend that towards the end of the treatment cycle that will be around the middle of December for an additional 2 weeks. That all depends on how well he continues to heal. Nonetheless based on what I am seeing in the foot I did want a proceed with an MRI as well which I think will be helpful to identify if there is anything else that needs to be addressed from the standpoint of infection. 06/24/2021 upon evaluation today patient appears to be doing pretty well in regards to his wounds. I think both are actually showing signs of improvement which is good I did review his MRI today which did show signs of osteomyelitis of the middle and proximal phalanx on his right foot of the affected toe. With that being said this is actually showing signs of significant improvement today already with the antibiotic therapy I think the redness is also improved. Overall I think that we just need to give this some time with appropriate wound care we will see how things go potentially hyperbarics could be considered. 07/02/2021 upon inspection today patient actually appears to be doing well in regard to his left ankle which is getting very close to complete resolution of pleased in that regard. Unfortunately he is continuing to have issues with his second toe right foot and this seems to still be very painful for him. Recommend he try something different from the standpoint of antibiotics. 07/15/2021 upon evaluation today patient appears to be doing actually pretty well in regard to his foot. This is actually showing signs of significant improvement which is great news. Overall I feel like the patient is improving both in regard to the second toe as well as the ankle on the  left. With that being said the biggest issue that I do see currently is that he is needing to have a refill of the doxycycline that we previously treated him with. He also did see podiatry they are not going to recommend any amputation at this point since he seems to be doing quite well. For that reason we just need to keep things under control from an infection standpoint. 08/01/2021 upon evaluation today patient appears to be doing well with regard to his wound. He has been tolerating the dressing changes without complication. Fortunately there does not appear to be any evidence of active infection locally nor systemically at this point. In fact I think everything is doing excellent in fact his second toe on the right foot is almost healed and the ankle on the left ankle region is actually very close to being healed  as well. Objective Constitutional Well-nourished and well-hydrated in no acute distress. Vitals Time Taken: 9:15 AM, Height: 67 in, Weight: 160 lbs, BMI: 25.1, Temperature: 97.8 F, Pulse: 81 bpm, Respiratory Rate: 18 breaths/min, Blood Pressure: 96/69 mmHg. Respiratory normal breathing without difficulty. Psychiatric this patient is able to make decisions and demonstrates good insight into disease process. Alert and Oriented x 3. pleasant and cooperative. General Notes: Patient's toenail region has completely resolved on the second toe right foot which is great news. The actual wound itself also appears to be pretty much resolved I Minna monitor for 1 more week before completely closing this out but I think it may be healed by next week. With regard to the left ankle region this is significantly smaller think the [facility] is doing well here. Integumentary (Hair, Skin) Wound #10 status is Healed - Epithelialized. Original cause of wound was Trauma. The date acquired was: 07/15/2021. The wound has been in treatment 2 weeks. The wound is located on the Right,Distal Toe Second.  The wound measures 0cm length x 0cm width x 0cm depth; 0cm^2 area and 0cm^3 volume. There is no tunneling or undermining noted. There is a none present amount of drainage noted. There is no granulation within the wound bed. There is no necrotic tissue within the wound bed. Wound #8 status is Open. Original cause of wound was Gradually Appeared. The date acquired was: 07/12/2019. The wound has been in treatment 50 weeks. The wound is located on the Left,Medial Ankle. The wound measures 0.5cm length x 0.4cm width x 0.1cm depth; 0.157cm^2 area and 0.016cm^3 volume. There is Fat Layer (Subcutaneous Tissue) exposed. There is no tunneling or undermining noted. There is a medium amount of serosanguineous drainage noted. The wound margin is flat and intact. There is small (1-33%) pink, pale granulation within the wound bed. There is a large (67-100%) amount of necrotic tissue within the wound bed including Eschar. ARMEND, HOCHSTATTER ([id number]) Wound #9 status is Open. Original cause of wound was Gradually Appeared. The date acquired was: 05/31/2021. The wound has been in treatment 7 weeks. The wound is located on the Right Toe Second. The wound measures 0.1cm length x 0.1cm width x 0.1cm depth; 0.008cm^2 area and 0.001cm^3 volume. There is bone and joint exposed. There is no tunneling or undermining noted. There is a medium amount of serosanguineous drainage noted. The wound margin is flat and intact. There is no granulation within the wound bed. There is a large (67-100%) amount of necrotic tissue within the wound bed including Eschar. 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 Nurse Visit as needed Bathing/ Shower/  Hygiene: May shower; gently cleanse wound with antibacterial soap, rinse and pat dry prior to dressing wounds Edema Control - Lymphedema / Segmental Compressive Device / Other: Elevate legs to the level of the heart and pump ankles as often as possible Elevate leg(s) parallel to the floor when sitting. Non-Wound Condition: Additional non-wound orders/instructions: - patient needs toenails clipped asap WOUND #8: - Ankle Wound Laterality: Left, Medial Cleanser: Normal Saline 3 x Per Week/30 Days Discharge Instructions: Wash your hands with soap and water. Remove old dressing, discard into plastic bag and place into trash. Cleanse the wound with Normal Saline prior to applying a clean dressing using gauze sponges, not tissues or cotton  balls. Do not scrub or use excessive force. Pat dry using gauze sponges, not tissue or cotton balls. Peri-Wound Care: Desitin Maximum Strength Ointment 4 (oz) 3 x Per Week/30 Days Discharge Instructions: Apply zinc periwound Primary Dressing: Hydrofera Blue Ready Transfer Foam, 2.5x2.5 (in/in) 3 x Per Week/30 Days Discharge Instructions: Apply Hydrofera Blue Ready to wound bed as directed (EXTRA SENT WITH PATIENT, PLEASE USE) Secondary Dressing: Kerlix 4.5 x 4.1 (in/yd) 3 x Per Week/30 Days Discharge Instructions: Apply kerlix over ABD pad Secured With: 94M Medipore H Soft Cloth Surgical Tape, 2x2 (in/yd) 3 x Per Week/30 Days Discharge Instructions: Secure kerlix WOUND #9: - Toe Second Wound Laterality: Right Cleanser: Normal Saline 3 x Per Week/30 Days Discharge Instructions: Wash your hands with soap and water. Remove old dressing, discard into plastic bag and place into trash. Cleanse the wound with Normal Saline prior to applying a clean dressing using gauze sponges, not tissues or cotton balls. Do not scrub or use excessive force. Pat dry using gauze sponges, not tissue or cotton balls. Primary Dressing: Xeroform-HBD 2x2 (in/in) 3 x Per Week/30  Days Discharge Instructions: Apply Xeroform-HBD 2x2 (in/in) as directed Secondary Dressing: Gauze 3 x Per Week/30 Days Discharge Instructions: As directed: dry, moistened with saline or moistened with Dakins Solution Secured With: 94M Medipore H Soft Cloth Surgical Tape, 2x2 (in/yd) 3 x Per Week/30 Days Discharge Instructions: Secure kerlix 1. Would continue with the [name] for the left ankle region. 2. I am also can recommend that we go ahead and continue with the Xeroform gauze for the right second toe as well. 3. I would recommend as well that the patient continue to monitor for any signs of worsening or infection. If anything changes he should let me know but I will plan to see him weekly. We will see patient back for reevaluation in 1 week here in the clinic. If anything worsens or changes patient will contact our office for additional recommendations. Electronic Signature(s) Signed: 08/01/2021 10:18:38 AM By: Maxie Barb, Damier ([id number]) Entered By: Worthy Keeler on 08/01/2021 10:18:38 Chad [name] ([id number]) -------------------------------------------------------------------------------- SuperBill Details Patient Name: Chad [name] Date of Service: 08/01/2021 Medical Record Number: [id number] Patient Account Number: [id number] Date of Birth/Sex: 1960-12-04 (60 y.o. 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 Facility Procedures CPT4 Code: 40768088 Description: 99213 - WOUND CARE VISIT-LEV 3 EST  PT Modifier: Quantity: 1 Physician Procedures CPT4 Code: 1103159 Description: 45859 - WC PHYS LEVEL 3 - EST PT Modifier: Quantity: 1 CPT4 Code: Description: ICD-10 Diagnosis Description I87.2 Venous insufficiency (chronic) (peripheral) 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

## 2021-08-01 NOTE — Progress Notes (Addendum)
[facility] [place] ([id number]) Visit Report for 08/01/2021 Arrival Information Details Patient Name: Chad [name], Chad [name] Date of Service: 08/01/2021 9:00 AM Medical Record Number: [id number] Patient Account Number: [id number] Date of Birth/Sex: 04-27-1961 (61 y.o. M) Treating RN: [name] Primary Care Terrick Allred: SYSTEM, PCP Other Clinician: Referring Ronne Savoia: Clayborn Bigness Treating Rachella Basden/Extender: Skipper Cliche in Treatment: 49 Visit Information History Since Last Visit Added or deleted any medications: No Patient Arrived: Wheel Chair Any new allergies or adverse reactions: No Arrival Time: 09:16 Had a fall or experienced change in No Accompanied By: self activities of daily living that may affect Transfer Assistance: EasyPivot Patient Lift risk of falls: Patient Identification Verified: Yes Hospitalized since last visit: No Secondary Verification Process Completed: Yes Has Dressing in Place as Prescribed: Yes Patient Requires Transmission-Based No Pain Present Now: No Precautions: Patient Has Alerts: Yes Patient Alerts: Patient on Blood Thinner ABI right 1.16 ABI left 1.14 SNF/[facility] Electronic Signature(s) Signed: 08/05/2021 9:27:28 AM By: [name] Entered By: [name] on 08/01/2021 09:18:48 Chad [name] ([id number]) -------------------------------------------------------------------------------- Clinic Level of Care Assessment Details Patient Name: Chad [name] Date of Service: 08/01/2021 9:00 AM Medical Record Number: [id number] Patient Account Number: [id number] Date of Birth/Sex: October 09, 1960 (61 y.o. M) Treating RN: [name] Primary Care Caylan Chenard: SYSTEM, PCP Other Clinician: Referring Johara Lodwick: Clayborn Bigness Treating Neilan Rizzo/Extender: Skipper Cliche in Treatment: 69 Clinic Level of Care Assessment Items TOOL 4 Quantity Score X - Use when only an EandM is performed on FOLLOW-UP visit 1 0 ASSESSMENTS - Nursing Assessment /  Reassessment '[]'  - Reassessment of Co-morbidities (includes updates in patient status) 0 '[]'  - 0 Reassessment of Adherence to Treatment Plan ASSESSMENTS - Wound and Skin Assessment / Reassessment '[]'  - Simple Wound Assessment / Reassessment - one wound 0 X- 2 5 Complex Wound Assessment / Reassessment - multiple wounds '[]'  - 0 Dermatologic / Skin Assessment (not related to wound area) ASSESSMENTS - Focused Assessment '[]'  - Circumferential Edema Measurements - multi extremities 0 '[]'  - 0 Nutritional Assessment / Counseling / Intervention '[]'  - 0 Lower Extremity Assessment (monofilament, tuning fork, pulses) '[]'  - 0 Peripheral Arterial Disease Assessment (using hand held doppler) ASSESSMENTS - Ostomy and/or Continence Assessment and Care '[]'  - Incontinence Assessment and Management 0 '[]'  - 0 Ostomy Care Assessment and Management (repouching, etc.) PROCESS - Coordination of Care X - Simple Patient / Family Education for ongoing care 1 15 '[]'  - 0 Complex (extensive) Patient / Family Education for ongoing care '[]'  - 0 Staff obtains Programmer, systems, Records, Test Results / Process Orders '[]'  - 0 Staff telephones HHA, Nursing Homes / Clarify orders / etc '[]'  - 0 Routine Transfer to another Facility (non-emergent condition) '[]'  - 0 Routine Hospital Admission (non-emergent condition) '[]'  - 0 New Admissions / Biomedical engineer / Ordering NPWT, Apligraf, etc. '[]'  - 0 Emergency Hospital Admission (emergent condition) X- 1 10 Simple Discharge Coordination '[]'  - 0 Complex (extensive) Discharge Coordination PROCESS - Special Needs '[]'  - Pediatric / Minor Patient Management 0 '[]'  - 0 Isolation Patient Management '[]'  - 0 Hearing / Language / Visual special needs '[]'  - 0 Assessment of Community assistance (transportation, D/C planning, etc.) '[]'  - 0 Additional assistance / Altered mentation '[]'  - 0 Support Surface(s) Assessment (bed, cushion, seat, etc.) INTERVENTIONS - Wound Cleansing /  Measurement Virgil, Verdell ([id number]) '[]'  - 0 Simple Wound Cleansing - one wound X- 2 5 Complex Wound Cleansing - multiple wounds X- 1 5 Wound Imaging (photographs - any number of wounds) '[]'  - 0 Wound Tracing (  instead of photographs) '[]'  - 0 Simple Wound Measurement - one wound X- 2 5 Complex Wound Measurement - multiple wounds INTERVENTIONS - Wound Dressings '[]'  - Small Wound Dressing one or multiple wounds 0 X- 2 15 Medium Wound Dressing one or multiple wounds '[]'  - 0 Large Wound Dressing one or multiple wounds '[]'  - 0 Application of Medications - topical '[]'  - 0 Application of Medications - injection INTERVENTIONS - Miscellaneous '[]'  - External ear exam 0 '[]'  - 0 Specimen Collection (cultures, biopsies, blood, body fluids, etc.) '[]'  - 0 Specimen(s) / Culture(s) sent or taken to Lab for analysis '[]'  - 0 Patient Transfer (multiple staff / Civil Service fast streamer / Similar devices) '[]'  - 0 Simple Staple / Suture removal (25 or less) '[]'  - 0 Complex Staple / Suture removal (26 or more) '[]'  - 0 Hypo / Hyperglycemic Management (close monitor of Blood Glucose) '[]'  - 0 Ankle / Brachial Index (ABI) - do not check if billed separately X- 1 5 Vital Signs Has the patient been seen at the hospital within the last three years: Yes Total Score: 95 Level Of Care: New/Established - Level 3 Electronic Signature(s) Signed: 08/05/2021 9:27:28 AM By: [name] Entered By: [name] on 08/01/2021 10:03:52 Chad [name] ([id number]) -------------------------------------------------------------------------------- Encounter Discharge Information Details Patient Name: Chad [name] Date of Service: 08/01/2021 9:00 AM Medical Record Number: [id number] Patient Account Number: [id number] Date of Birth/Sex: 1960/11/15 (61 y.o. M) Treating RN: [name] Primary Care Jayron Maqueda: SYSTEM, PCP Other Clinician: Referring Emmalia Heyboer: Clayborn Bigness Treating Hydeia Mcatee/Extender: Skipper Cliche in  Treatment: 77 Encounter Discharge Information Items Discharge Condition: Stable Ambulatory Status: Wheelchair Discharge Destination: Skilled Nursing Facility Orders Sent: Yes Transportation: Other Accompanied By: self/transport Schedule Follow-up Appointment: Yes Clinical Summary of Care: Electronic Signature(s) Signed: 08/01/2021 11:12:08 AM By: [name] Entered By: [name] on 08/01/2021 11:12:08 Chad [name] ([id number]) -------------------------------------------------------------------------------- Lower Extremity Assessment Details Patient Name: Chad [name] Date of Service: 08/01/2021 9:00 AM Medical Record Number: [id number] Patient Account Number: [id number] Date of Birth/Sex: [birth year] (61 y.o. M) Treating RN: [name] Primary Care Malaak Stach: SYSTEM, PCP Other Clinician: Referring Adline Kirshenbaum: Clayborn Bigness Treating Yasin Ducat/Extender: Jeri Cos Weeks in Treatment: 50 Vascular Assessment Pulses: Dorsalis Pedis Palpable: [Left:Yes] [Right:Yes] Electronic Signature(s) Signed: 08/05/2021 9:27:28 AM By: [name] Entered By: [name] on 08/01/2021 09:35:45 Chad [name] ([id number]) -------------------------------------------------------------------------------- Multi Wound Chart Details Patient Name: Chad [name] Date of Service: 08/01/2021 9:00 AM Medical Record Number: [id number] Patient Account Number: [id number] Date of Birth/Sex: [birth year] (61 y.o. M) Treating RN: [name] Primary Care Garmon Dehn: SYSTEM, PCP Other Clinician: Referring Jettie Mannor: Clayborn Bigness Treating Shane Badeaux/Extender: Skipper Cliche in Treatment: 10 Vital Signs Height(in): 12 Pulse(bpm): 96 Weight(lbs): 160 Blood Pressure(mmHg): 96/69 Body Mass Index(BMI): 25 Temperature(F): 97.8 Respiratory Rate(breaths/min): 18 Photos: Wound Location: Right, Distal Toe Second Left, Medial Ankle Right Toe Second Wounding Event: Trauma Gradually Appeared  Gradually Appeared Primary Etiology: Trauma, Other Venous Leg Ulcer Pressure Ulcer Comorbid History: Anemia, Chronic Obstructive Anemia, Chronic Obstructive Anemia, Chronic Obstructive Pulmonary Disease (COPD), Pulmonary Disease (COPD), Pulmonary Disease (COPD), Hepatitis C, Received Hepatitis C, Received Hepatitis C, Received Chemotherapy Chemotherapy Chemotherapy Date Acquired: 07/15/2021 07/12/2019 05/31/2021 Weeks of Treatment: 2 50 7 Wound Status: Healed - Epithelialized Open Open Measurements L x W x D (cm) 0x0x0 0.5x0.4x0.1 0.1x0.1x0.1 Area (cm) : 0 0.157 0.008 Volume (cm) : 0 0.016 0.001 % Reduction in Area: 100.00% 97.30% 97.90% % Reduction in Volume: 100.00% 97.30% 98.70% Classification: Full Thickness Without Exposed Full Thickness Without Exposed Category/Stage IV Support Structures  Support Structures Exudate Amount: None Present Medium Medium Exudate Type: N/A Serosanguineous Serosanguineous Exudate Color: N/A red, brown red, brown Wound Margin: N/A Flat and Intact Flat and Intact Granulation Amount: None Present (0%) Small (1-33%) None Present (0%) Granulation Quality: N/A Pink, Pale N/A Necrotic Amount: None Present (0%) Large (67-100%) Large (67-100%) Necrotic Tissue: N/A Eschar Eschar Exposed Structures: Fat Layer (Subcutaneous Tissue): Fat Layer (Subcutaneous Tissue): Joint: Yes No Yes Bone: Yes Fascia: No Fascia: No Tendon: No Fat Layer (Subcutaneous Tissue): Muscle: No No Joint: No Tendon: No Bone: No Muscle: No Epithelialization: Large (67-100%) None None Treatment Notes Electronic Signature(s) Signed: 08/05/2021 9:27:28 AM By: [name] Entered By: [name] on 08/01/2021 09:50:22 Chad [name] ([id number]) Chad [name] ([id number]) -------------------------------------------------------------------------------- Multi-Disciplinary Care Plan Details Patient Name: Chad [name] Date of Service: 08/01/2021 9:00 AM Medical Record  Number: [id number] Patient Account Number: [id number] Date of Birth/Sex: Feb 01, 1961 (61 y.o. M) Treating RN: [name] Primary Care Daltyn Degroat: SYSTEM, PCP Other Clinician: Referring Cavion Faiola: Clayborn Bigness Treating Ellawyn Wogan/Extender: Skipper Cliche in Treatment: 45 Active Inactive Necrotic Tissue Nursing Diagnoses: Impaired tissue integrity related to necrotic/devitalized tissue Knowledge deficit related to management of necrotic/devitalized tissue Goals: Necrotic/devitalized tissue will be minimized in the wound bed Date Initiated: 05/20/2021 Target Resolution Date: 05/20/2021 Goal Status: Active Patient/caregiver will verbalize understanding of reason and process for debridement of necrotic tissue Date Initiated: 05/20/2021 Date Inactivated: 06/17/2021 Target Resolution Date: 05/20/2021 Goal Status: Met Interventions: Assess patient pain level pre-, during and post procedure and prior to discharge Provide education on necrotic tissue and debridement process Treatment Activities: Apply topical anesthetic as ordered : 05/20/2021 Notes: Osteomyelitis Nursing Diagnoses: Infection: osteomyelitis Knowledge deficit related to disease process and management Goals: Diagnostic evaluation for osteomyelitis completed as ordered Date Initiated: 06/24/2021 Target Resolution Date: 06/21/2021 Goal Status: Active Patient/caregiver will verbalize understanding of disease process and disease management Date Initiated: 06/24/2021 Target Resolution Date: 06/24/2021 Goal Status: Active Patient's osteomyelitis will resolve Date Initiated: 06/24/2021 Target Resolution Date: 07/22/2021 Goal Status: Active Signs and symptoms for osteomyelitis will be recognized and promptly addressed Date Initiated: 06/24/2021 Target Resolution Date: 06/24/2021 Goal Status: Active Interventions: Assess for signs and symptoms of osteomyelitis resolution every visit Provide education on  osteomyelitis Treatment Activities: MRI : 06/21/2021 Systemic antibiotics : 06/24/2021 Notes: MARGUES, FILIPPINI ([id number]) Electronic Signature(s) Signed: 08/05/2021 9:27:28 AM By: [name] Entered By: [name] on 08/01/2021 09:50:04 Chad [name] ([id number]) -------------------------------------------------------------------------------- Pain Assessment Details Patient Name: Chad [name] Date of Service: 08/01/2021 9:00 AM Medical Record Number: [id number] Patient Account Number: [id number] Date of Birth/Sex: 11-10-1960 (61 y.o. M) Treating RN: [name] Primary Care Najia Hurlbutt: SYSTEM, PCP Other Clinician: Referring Zared Knoth: Clayborn Bigness Treating Bennie Scaff/Extender: Skipper Cliche in Treatment: 50 Active Problems Location of Pain Severity and Description of Pain Patient Has Paino No Site Locations Rate the pain. Current Pain Level: 0 Pain Management and Medication Current Pain Management: Electronic Signature(s) Signed: 08/05/2021 9:27:28 AM By: [name] Entered By: [name] on 08/01/2021 09:19:51 Chad [name] ([id number]) -------------------------------------------------------------------------------- Patient/Caregiver Education Details Patient Name: Chad [name] Date of Service: 08/01/2021 9:00 AM Medical Record Number: [id number] Patient Account Number: [id number] Date of Birth/Gender: 01-Feb-1961 (61 y.o. M) Treating RN: [name] Primary Care Physician: SYSTEM, PCP Other Clinician: Referring Physician: Clayborn Bigness Treating Physician/Extender: Skipper Cliche in Treatment: 91 Education Assessment Education Provided To: Patient Education Topics Provided Wound/Skin Impairment: Handouts: Caring for Your Ulcer Methods: Explain/Verbal Responses: State content correctly Electronic Signature(s) Signed: 08/05/2021 9:27:28 AM By: [name] Entered By: [name]  on 08/01/2021 10:04:15 DAUNTAE, DERUSHA  ([id number]) -------------------------------------------------------------------------------- Wound Assessment Details Patient Name: BEACHER, EVERY Date of Service: 08/01/2021 9:00 AM Medical Record Number: [id number] Patient Account Number: [id number] Date of Birth/Sex: 03-26-1961 (61 y.o. M) Treating RN: [name] Primary Care Kila Godina: SYSTEM, PCP Other Clinician: Referring Camala Talwar: Clayborn Bigness Treating Elke Holtry/Extender: Skipper Cliche in Treatment: 12 Wound Status Wound Number: 10 Primary Trauma, Other Etiology: Wound Location: Right, Distal Toe Second Wound Healed - Epithelialized Wounding Event: Trauma Status: Date Acquired: 07/15/2021 Notes: toe nail Weeks Of Treatment: 2 Comorbid Anemia, Chronic Obstructive Pulmonary Disease (COPD), Clustered Wound: No History: Hepatitis C, Received Chemotherapy Photos Wound Measurements Length: (cm) 0 Width: (cm) 0 Depth: (cm) 0 Area: (cm) 0 Volume: (cm) 0 % Reduction in Area: 100% % Reduction in Volume: 100% Epithelialization: Large (67-100%) Tunneling: No Undermining: No Wound Description Classification: Full Thickness Without Exposed Support Structure Exudate Amount: None Present s Foul Odor After Cleansing: No Slough/Fibrino No Wound Bed Granulation Amount: None Present (0%) Exposed Structure Necrotic Amount: None Present (0%) Fat Layer (Subcutaneous Tissue) Exposed: No Treatment Notes Wound #10 (Toe Second) Wound Laterality: Right, Distal Cleanser Peri-Wound Care Topical Primary Dressing Secondary Dressing Secured With Compression NYZIR, DUBOIS ([id number]) Compression Stockings Add-Ons Electronic Signature(s) Signed: 08/05/2021 9:27:28 AM By: [name] Entered By: [name] on 08/01/2021 09:49:24 Chad [name] ([id number]) -------------------------------------------------------------------------------- Wound Assessment Details Patient Name: Chad [name] Date of Service:  08/01/2021 9:00 AM Medical Record Number: [id number] Patient Account Number: [id number] Date of Birth/Sex: 09/04/1960 (61 y.o. M) Treating RN: [name] Primary Care Clemens Lachman: SYSTEM, PCP Other Clinician: Referring Tanuj Mullens: Clayborn Bigness Treating Verena Shawgo/Extender: Skipper Cliche in Treatment: 1 Wound Status Wound Number: 8 Primary Venous Leg Ulcer Etiology: Wound Location: Left, Medial Ankle Wound Open Wounding Event: Gradually Appeared Status: Date Acquired: 07/12/2019 Comorbid Anemia, Chronic Obstructive Pulmonary Disease (COPD), Weeks Of Treatment: 50 History: Hepatitis C, Received Chemotherapy Clustered Wound: No Photos Wound Measurements Length: (cm) 0.5 Width: (cm) 0.4 Depth: (cm) 0.1 Area: (cm) 0.157 Volume: (cm) 0.016 % Reduction in Area: 97.3% % Reduction in Volume: 97.3% Epithelialization: None Tunneling: No Undermining: No Wound Description Classification: Full Thickness Without Exposed Support Structures Wound Margin: Flat and Intact Exudate Amount: Medium Exudate Type: Serosanguineous Exudate Color: red, brown Foul Odor After Cleansing: No Slough/Fibrino Yes Wound Bed Granulation Amount: Small (1-33%) Exposed Structure Granulation Quality: Pink, Pale Fascia Exposed: No Necrotic Amount: Large (67-100%) Fat Layer (Subcutaneous Tissue) Exposed: Yes Necrotic Quality: Eschar Tendon Exposed: No Muscle Exposed: No Joint Exposed: No Bone Exposed: No Treatment Notes Wound #8 (Ankle) Wound Laterality: Left, Medial Cleanser Normal Saline Discharge Instruction: Wash your hands with soap and water. Remove old dressing, discard into plastic bag and place into trash. Cleanse the wound with Normal Saline prior to applying a clean dressing using gauze sponges, not tissues or cotton balls. Do not scrub or use excessive force. Pat dry using gauze sponges, not tissue or cotton balls. KHOURY, SIEMON ([id number]) Peri-Wound Care Desitin Maximum Strength  Ointment 4 (oz) Discharge Instruction: Apply zinc periwound Topical Primary Dressing Hydrofera Blue Ready Transfer Foam, 2.5x2.5 (in/in) Discharge Instruction: Apply Hydrofera Blue Ready to wound bed as directed (EXTRA SENT WITH PATIENT, PLEASE USE) Secondary Dressing Kerlix 4.5 x 4.1 (in/yd) Discharge Instruction: Apply kerlix over ABD pad Secured With 12M Radnor Surgical Tape, 2x2 (in/yd) Discharge Instruction: Secure kerlix Compression Wrap Compression Stockings Add-Ons Electronic Signature(s) Signed: 08/05/2021 9:27:28 AM By: [name] Entered By: [name] on 08/01/2021 09:47:38 Chad [name] ([id number]) -------------------------------------------------------------------------------- Wound  Assessment Details Patient Name: COLBY, REELS Date of Service: 08/01/2021 9:00 AM Medical Record Number: [id number] Patient Account Number: [id number] Date of Birth/Sex: 12/09/1960 (61 y.o. M) Treating RN: [name] Primary Care Bruno Leach: SYSTEM, PCP Other Clinician: Referring Ragan Reale: Clayborn Bigness Treating Chrisangel Eskenazi/Extender: Skipper Cliche in Treatment: 77 Wound Status Wound Number: 9 Primary Pressure Ulcer Etiology: Wound Location: Right Toe Second Wound Open Wounding Event: Gradually Appeared Status: Date Acquired: 05/31/2021 Comorbid Anemia, Chronic Obstructive Pulmonary Disease (COPD), Weeks Of Treatment: 7 History: Hepatitis C, Received Chemotherapy Clustered Wound: No Photos Wound Measurements Length: (cm) 0.1 Width: (cm) 0.1 Depth: (cm) 0.1 Area: (cm) 0.008 Volume: (cm) 0.001 % Reduction in Area: 97.9% % Reduction in Volume: 98.7% Epithelialization: None Tunneling: No Undermining: No Wound Description Classification: Category/Stage IV Wound Margin: Flat and Intact Exudate Amount: Medium Exudate Type: Serosanguineous Exudate Color: red, brown Foul Odor After Cleansing: No Slough/Fibrino Yes Wound Bed Granulation Amount:  None Present (0%) Exposed Structure Necrotic Amount: Large (67-100%) Fascia Exposed: No Necrotic Quality: Eschar Fat Layer (Subcutaneous Tissue) Exposed: No Tendon Exposed: No Muscle Exposed: No Joint Exposed: Yes Bone Exposed: Yes Treatment Notes Wound #9 (Toe Second) Wound Laterality: Right Cleanser Normal Saline Discharge Instruction: Wash your hands with soap and water. Remove old dressing, discard into plastic bag and place into trash. Cleanse the wound with Normal Saline prior to applying a clean dressing using gauze sponges, not tissues or cotton balls. Do not scrub or use excessive force. Pat dry using gauze sponges, not tissue or cotton balls. AHYAN, KREEGER ([id number]) Peri-Wound Care Topical Primary Dressing Xeroform-HBD 2x2 (in/in) Discharge Instruction: Apply Xeroform-HBD 2x2 (in/in) as directed Secondary Dressing Gauze Discharge Instruction: As directed: dry, moistened with saline or moistened with Dakins Solution Secured With 31M Medipore H Soft Cloth Surgical Tape, 2x2 (in/yd) Discharge Instruction: Secure kerlix Compression Wrap Compression Stockings Add-Ons Electronic Signature(s) Signed: 08/05/2021 9:27:28 AM By: [name] Entered By: [name] on 08/01/2021 09:48:56 Chad [name] ([id number]) -------------------------------------------------------------------------------- Vitals Details Patient Name: Chad [name] Date of Service: 08/01/2021 9:00 AM Medical Record Number: [id number] Patient Account Number: [id number] Date of Birth/Sex: [birth year] (61 y.o. M) Treating RN: [name] Primary Care Alexey Rhoads: SYSTEM, PCP Other Clinician: Referring Teresita Fanton: Clayborn Bigness Treating Chisom Aust/Extender: Skipper Cliche in Treatment: 50 Vital Signs Time Taken: 09:15 Temperature (F): 97.8 Height (in): 67 Pulse (bpm): 81 Weight (lbs): 160 Respiratory Rate (breaths/min): 18 Body Mass Index (BMI): 25.1 Blood Pressure (mmHg):  96/69 Reference Range: 80 - 120 mg / dl Electronic Signature(s) Signed: 08/05/2021 9:27:28 AM By: [name] Entered By: [name] on 08/01/2021 09:19:41

## 2021-08-08 ENCOUNTER — Other Ambulatory Visit: Payer: Self-pay

## 2021-08-08 ENCOUNTER — Encounter: Payer: Medicaid Other | Admitting: Physician Assistant

## 2021-08-08 DIAGNOSIS — I872 Venous insufficiency (chronic) (peripheral): Secondary | ICD-10-CM | POA: Diagnosis not present

## 2021-08-08 NOTE — Progress Notes (Addendum)
ALANDO, COLLERAN ([id number]) Visit Report for 08/08/2021 Arrival Information Details Patient Name: Chad [name], OHANESIAN Date of Service: 08/08/2021 9:45 AM Medical Record Number: [id number] Patient Account Number: [id number] Date of Birth/Sex: 1960/12/18 (61 y.o. M) Treating RN: [name] Primary Care : SYSTEM, PCP Other Clinician: Referring : Clayborn Bigness Treating /Extender: Skipper Cliche in Treatment: 51 Visit Information History Since Last Visit Added or deleted any medications: No Patient Arrived: Wheel Chair Any new allergies or adverse reactions: No Arrival Time: 10:25 Had a fall or experienced change in No Accompanied By: self activities of daily living that may affect Transfer Assistance: EasyPivot Patient Lift risk of falls: Patient Identification Verified: Yes Hospitalized since last visit: No Secondary Verification Process Completed: Yes Has Dressing in Place as Prescribed: Yes Patient Requires Transmission-Based No Pain Present Now: No Precautions: Patient Has Alerts: Yes Patient Alerts: Patient on Blood Thinner ABI right 1.16 ABI left 1.14 SNF/[facility] Electronic Signature(s) Signed: 08/08/2021 12:48:18 PM By: [name] Entered By: [name] on 08/08/2021 10:26:03 Chad [name] ([id number]) -------------------------------------------------------------------------------- Clinic Level of Care Assessment Details Patient Name: Chad [name] Date of Service: 08/08/2021 9:45 AM Medical Record Number: [id number] Patient Account Number: [id number] Date of Birth/Sex: 15-Aug-1960 (60 y.o. M) Treating RN: [name] Primary Care : SYSTEM, PCP Other Clinician: Referring : Clayborn Bigness Treating /Extender: Skipper Cliche in Treatment: 51 Clinic Level of Care Assessment Items TOOL 1 Quantity Score [] - Use when EandM and Procedure is performed on INITIAL visit 0 ASSESSMENTS - Nursing  Assessment / Reassessment [] - General Physical Exam (combine w/ comprehensive assessment (listed just below) when performed on new 0 pt. evals) [] - 0 Comprehensive Assessment (HX, ROS, Risk Assessments, Wounds Hx, etc.) ASSESSMENTS - Wound and Skin Assessment / Reassessment [] - Dermatologic / Skin Assessment (not related to wound area) 0 ASSESSMENTS - Ostomy and/or Continence Assessment and Care [] - Incontinence Assessment and Management 0 [] - 0 Ostomy Care Assessment and Management (repouching, etc.) PROCESS - Coordination of Care [] - Simple Patient / Family Education for ongoing care 0 [] - 0 Complex (extensive) Patient / Family Education for ongoing care [] - 0 Staff obtains Programmer, systems, Records, Test Results / Process Orders [] - 0 Staff telephones HHA, Nursing Homes / Clarify orders / etc [] - 0 Routine Transfer to another Facility (non-emergent condition) [] - 0 Routine Hospital Admission (non-emergent condition) [] - 0 New Admissions / Biomedical engineer / Ordering NPWT, Apligraf, etc. [] - 0 Emergency Hospital Admission (emergent condition) PROCESS - Special Needs [] - Pediatric / Minor Patient Management 0 [] - 0 Isolation Patient Management [] - 0 Hearing / Language / Visual special needs [] - 0 Assessment of Community assistance (transportation, D/C planning, etc.) [] - 0 Additional assistance / Altered mentation [] - 0 Support Surface(s) Assessment (bed, cushion, seat, etc.) INTERVENTIONS - Miscellaneous [] - External ear exam 0 [] - 0 Patient Transfer (multiple staff / Civil Service fast streamer / Similar devices) [] - 0 Simple Staple / Suture removal (25 or less) [] - 0 Complex Staple / Suture removal (26 or more) [] - 0 Hypo/Hyperglycemic Management (do not check if billed separately) [] - 0 Ankle / Brachial Index (ABI) - do not check if billed separately Has the patient been seen at the hospital within the last three years: Yes Total Score: 0 Level Of  Care: ____ Chad [name] ([id number]) Electronic Signature(s) Signed: 08/08/2021 12:48:18 PM By: [name] Entered By: [name] on 08/08/2021 12:02:09 Gille, [name] (  [id number]) -------------------------------------------------------------------------------- Encounter Discharge Information Details Patient Name: SHANDY, VI Date of Service: 08/08/2021 9:45 AM Medical Record Number: [id number] Patient Account Number: [id number] Date of Birth/Sex: [birth year] (61 y.o. M) Treating RN: [name] Primary Care : SYSTEM, PCP Other Clinician: Referring : Clayborn Bigness Treating /Extender: Skipper Cliche in Treatment: 51 Encounter Discharge Information Items Post Procedure Vitals Discharge Condition: Stable Temperature (F): 98.3 Ambulatory Status: Wheelchair Pulse (bpm): 93 Discharge Destination: [place] Respiratory Rate (breaths/min): 18 Orders Sent: Yes Blood Pressure (mmHg): 108/67 Transportation: Other Accompanied By: self Schedule Follow-up Appointment: Yes Clinical Summary of Care: Electronic Signature(s) Signed: 08/08/2021 12:04:04 PM By: [name] Entered By: [name] on 08/08/2021 12:04:04 Chad [name] ([id number]) -------------------------------------------------------------------------------- Lower Extremity Assessment Details Patient Name: Chad [name] Date of Service: 08/08/2021 9:45 AM Medical Record Number: [id number] Patient Account Number: [id number] Date of Birth/Sex: February 27, 1961 (60 y.o. M) Treating RN: [name] Primary Care : SYSTEM, PCP Other Clinician: Referring : Clayborn Bigness Treating /Extender: Jeri Cos Weeks in Treatment: 51 Edema Assessment Assessed: [Left: No] [Right: No] Edema: [Left: No] [Right: No] Vascular Assessment Pulses: Dorsalis Pedis Palpable: [Left:Yes] [Right:Yes] Electronic Signature(s) Signed: 08/08/2021 12:48:18 PM By:  [name] Entered By: [name] on 08/08/2021 10:35:35 Chad [name] ([id number]) -------------------------------------------------------------------------------- Multi Wound Chart Details Patient Name: Chad [name] Date of Service: 08/08/2021 9:45 AM Medical Record Number: [id number] Patient Account Number: [id number] Date of Birth/Sex: 1961-05-08 (61 y.o. M) Treating RN: [name] Primary Care : SYSTEM, PCP Other Clinician: Referring : Clayborn Bigness Treating /Extender: Skipper Cliche in Treatment: 51 Vital Signs Height(in): 44 Pulse(bpm): 93 Weight(lbs): 160 Blood Pressure(mmHg): 108/67 Body Mass Index(BMI): 25 Temperature(F): 98.3 Respiratory Rate(breaths/min): 18 Photos: [N/A:N/A] Wound Location: Left, Medial Ankle Right Toe Second N/A Wounding Event: Gradually Appeared Gradually Appeared N/A Primary Etiology: Venous Leg Ulcer Pressure Ulcer N/A Comorbid History: Anemia, Chronic Obstructive Anemia, Chronic Obstructive N/A Pulmonary Disease (COPD), Pulmonary Disease (COPD), Hepatitis C, Received Hepatitis C, Received Chemotherapy Chemotherapy Date Acquired: 07/12/2019 05/31/2021 N/A Weeks of Treatment: 51 8 N/A Wound Status: Open Open N/A Measurements L x W x D (cm) 0.5x0.3x0.1 0.2x0.4x0.1 N/A Area (cm) : 0.118 0.063 N/A Volume (cm) : 0.012 0.006 N/A % Reduction in Area: 98.00% 83.30% N/A % Reduction in Volume: 98.00% 92.00% N/A Classification: Full Thickness Without Exposed Category/Stage IV N/A Support Structures Exudate Amount: Medium Medium N/A Exudate Type: Serosanguineous Serosanguineous N/A Exudate Color: red, brown red, brown N/A Wound Margin: Flat and Intact Flat and Intact N/A Granulation Amount: Small (1-33%) None Present (0%) N/A Granulation Quality: Pink, Pale N/A N/A Necrotic Amount: Large (67-100%) Large (67-100%) N/A Necrotic Tissue: Eschar Eschar N/A Exposed Structures: Fat Layer (Subcutaneous  Tissue): Joint: Yes N/A Yes Bone: Yes Fascia: No Fascia: No Tendon: No Fat Layer (Subcutaneous Tissue): Muscle: No No Joint: No Tendon: No Bone: No Muscle: No Epithelialization: None None N/A Treatment Notes Electronic Signature(s) Signed: 08/08/2021 12:48:18 PM By: [name] Entered By: [name] on 08/08/2021 10:41:16 Chad [name] ([id number]) Chad [name] ([id number]) -------------------------------------------------------------------------------- Multi-Disciplinary Care Plan Details Patient Name: Chad [name] Date of Service: 08/08/2021 9:45 AM Medical Record Number: [id number] Patient Account Number: [id number] Date of Birth/Sex: 12/23/1960 (61 y.o. M) Treating RN: [name] Primary Care : SYSTEM, PCP Other Clinician: Referring : Clayborn Bigness Treating /Extender: Skipper Cliche in Treatment: 51 Active Inactive Necrotic Tissue Nursing Diagnoses: Impaired tissue integrity related to necrotic/devitalized tissue Knowledge deficit related to management of necrotic/devitalized tissue Goals: Necrotic/devitalized tissue will be minimized in the wound bed Date Initiated: 05/20/2021 Target  Resolution Date: 05/20/2021 Goal Status: Active Patient/caregiver will verbalize understanding of reason and process for debridement of necrotic tissue Date Initiated: 05/20/2021 Date Inactivated: 06/17/2021 Target Resolution Date: 05/20/2021 Goal Status: Met Interventions: Assess patient pain level pre-, during and post procedure and prior to discharge Provide education on necrotic tissue and debridement process Treatment Activities: Apply topical anesthetic as ordered : 05/20/2021 Notes: Osteomyelitis Nursing Diagnoses: Infection: osteomyelitis Knowledge deficit related to disease process and management Goals: Diagnostic evaluation for osteomyelitis completed as ordered Date Initiated: 06/24/2021 Target Resolution Date:  06/21/2021 Goal Status: Active Patient/caregiver will verbalize understanding of disease process and disease management Date Initiated: 06/24/2021 Target Resolution Date: 06/24/2021 Goal Status: Active Patient's osteomyelitis will resolve Date Initiated: 06/24/2021 Target Resolution Date: 07/22/2021 Goal Status: Active Signs and symptoms for osteomyelitis will be recognized and promptly addressed Date Initiated: 06/24/2021 Target Resolution Date: 06/24/2021 Goal Status: Active Interventions: Assess for signs and symptoms of osteomyelitis resolution every visit Provide education on osteomyelitis Treatment Activities: MRI : 06/21/2021 Systemic antibiotics : 06/24/2021 Notes: JAISEN, WILTROUT ([id number]) Electronic Signature(s) Signed: 08/08/2021 12:48:18 PM By: [name] Entered By: [name] on 08/08/2021 10:41:01 Chad [name] ([id number]) -------------------------------------------------------------------------------- Pain Assessment Details Patient Name: Chad [name] Date of Service: 08/08/2021 9:45 AM Medical Record Number: [id number] Patient Account Number: [id number] Date of Birth/Sex: [birth year] (61 y.o. M) Treating RN: [name] Primary Care : SYSTEM, PCP Other Clinician: Referring : Clayborn Bigness Treating /Extender: Skipper Cliche in Treatment: 51 Active Problems Location of Pain Severity and Description of Pain Patient Has Paino No Site Locations Rate the pain. Current Pain Level: 0 Pain Management and Medication Current Pain Management: Electronic Signature(s) Signed: 08/08/2021 12:48:18 PM By: [name] Entered By: [name] on 08/08/2021 10:27:42 Chad [name] ([id number]) -------------------------------------------------------------------------------- Patient/Caregiver Education Details Patient Name: Chad [name] Date of Service: 08/08/2021 9:45 AM Medical Record Number: [id number] Patient  Account Number: [id number] Date of Birth/Gender: 02-May-1961 (61 y.o. M) Treating RN: [name] Primary Care Physician: SYSTEM, PCP Other Clinician: Referring Physician: Clayborn Bigness Treating Physician/Extender: Skipper Cliche in Treatment: 66 Education Assessment Education Provided To: Patient Education Topics Provided Wound Debridement: Handouts: Wound Debridement Methods: Explain/Verbal Responses: State content correctly Wound/Skin Impairment: Handouts: Caring for Your Ulcer Methods: Explain/Verbal Responses: State content correctly Electronic Signature(s) Signed: 08/08/2021 12:48:18 PM By: [name] Entered By: [name] on 08/08/2021 12:02:38 Chad [name] ([id number]) -------------------------------------------------------------------------------- Wound Assessment Details Patient Name: Chad [name] Date of Service: 08/08/2021 9:45 AM Medical Record Number: [id number] Patient Account Number: [id number] Date of Birth/Sex: 1961/06/18 (60 y.o. M) Treating RN: [name] Primary Care : SYSTEM, PCP Other Clinician: Referring : Clayborn Bigness Treating /Extender: Skipper Cliche in Treatment: 51 Wound Status Wound Number: 8 Primary Venous Leg Ulcer Etiology: Wound Location: Left, Medial Ankle Wound Open Wounding Event: Gradually Appeared Status: Date Acquired: 07/12/2019 Comorbid Anemia, Chronic Obstructive Pulmonary Disease (COPD), Weeks Of Treatment: 51 History: Hepatitis C, Received Chemotherapy Clustered Wound: No Photos Wound Measurements Length: (cm) 0.5 Width: (cm) 0.3 Depth: (cm) 0.1 Area: (cm) 0.118 Volume: (cm) 0.012 % Reduction in Area: 98% % Reduction in Volume: 98% Epithelialization: None Tunneling: No Undermining: No Wound Description Classification: Full Thickness Without Exposed Support Structures Wound Margin: Flat and Intact Exudate Amount: Medium Exudate Type: Serosanguineous Exudate Color:  red, brown Foul Odor After Cleansing: No Slough/Fibrino Yes Wound Bed Granulation Amount: Small (1-33%) Exposed Structure Granulation Quality: Pink, Pale Fascia Exposed: No Necrotic Amount: Large (67-100%) Fat Layer (Subcutaneous Tissue) Exposed: Yes Necrotic Quality: Eschar Tendon Exposed: No Muscle Exposed: No  Joint Exposed: No Bone Exposed: No Treatment Notes Wound #8 (Ankle) Wound Laterality: Left, Medial Cleanser Normal Saline Discharge Instruction: Wash your hands with soap and water. Remove old dressing, discard into plastic bag and place into trash. Cleanse the wound with Normal Saline prior to applying a clean dressing using gauze sponges, not tissues or cotton balls. Do not scrub or use excessive force. Pat dry using gauze sponges, not tissue or cotton balls. CANNEN, DUPRAS ([id number]) Peri-Wound Care Desitin Maximum Strength Ointment 4 (oz) Discharge Instruction: Apply zinc periwound thin layer Topical Primary Dressing Xeroform-HBD 2x2 (in/in) Discharge Instruction: Apply Xeroform-HBD 2x2 (in/in) as directed Secondary Dressing Kerlix 4.5 x 4.1 (in/yd) Discharge Instruction: Apply kerlix over ABD pad Secured With [employer] Surgical Tape, 2x2 (in/yd) Discharge Instruction: Secure kerlix Compression Wrap Compression Stockings Add-Ons Electronic Signature(s) Signed: 08/08/2021 12:48:18 PM By: [name] Entered By: [name] on 08/08/2021 10:34:11 Chad [name] ([id number]) -------------------------------------------------------------------------------- Wound Assessment Details Patient Name: Chad [name] Date of Service: 08/08/2021 9:45 AM Medical Record Number: [id number] Patient Account Number: [id number] Date of Birth/Sex: [birth year] (61 y.o. M) Treating RN: [name] Primary Care : SYSTEM, PCP Other Clinician: Referring : Clayborn Bigness Treating /Extender: Skipper Cliche in Treatment: 30 Wound  Status Wound Number: 9 Primary Pressure Ulcer Etiology: Wound Location: Right Toe Second Wound Healed - Epithelialized Wounding Event: Gradually Appeared Status: Date Acquired: 05/31/2021 Comorbid Anemia, Chronic Obstructive Pulmonary Disease (COPD), Weeks Of Treatment: 8 History: Hepatitis C, Received Chemotherapy Clustered Wound: No Photos Wound Measurements Length: (cm) 0 % Red Width: (cm) 0 % Red Depth: (cm) 0 Epith Area: (cm) 0 Tunn Volume: (cm) 0 Unde uction in Area: 100% uction in Volume: 100% elialization: None eling: No rmining: No Wound Description Classification: Category/Stage IV Foul Wound Margin: Flat and Intact Slou Exudate Amount: Medium Exudate Type: Serosanguineous Exudate Color: red, brown Odor After Cleansing: No gh/Fibrino Yes Wound Bed Granulation Amount: None Present (0%) Exposed Structure Necrotic Amount: Large (67-100%) Fascia Exposed: No Necrotic Quality: Eschar Fat Layer (Subcutaneous Tissue) Exposed: No Tendon Exposed: No Muscle Exposed: No Joint Exposed: Yes Bone Exposed: Yes Treatment Notes Wound #9 (Toe Second) Wound Laterality: Right Cleanser Peri-Wound Care Topical BRAND, SIEVER ([id number]) Primary Dressing Secondary Dressing Secured With Compression Wrap Compression Stockings Add-Ons Electronic Signature(s) Signed: 08/08/2021 12:48:18 PM By: [name] Entered By: [name] on 08/08/2021 10:42:43 Chad [name] ([id number]) -------------------------------------------------------------------------------- Vitals Details Patient Name: Chad [name] Date of Service: 08/08/2021 9:45 AM Medical Record Number: [id number] Patient Account Number: [id number] Date of Birth/Sex: July 05, 1961 (61 y.o. M) Treating RN: [name] Primary Care : SYSTEM, PCP Other Clinician: Referring : Clayborn Bigness Treating /Extender: Skipper Cliche in Treatment: 51 Vital Signs Time Taken:  10:26 Temperature (F): 98.3 Height (in): 67 Pulse (bpm): 93 Weight (lbs): 160 Respiratory Rate (breaths/min): 18 Body Mass Index (BMI): 25.1 Blood Pressure (mmHg): 108/67 Reference Range: 80 - 120 mg / dl Electronic Signature(s) Signed: 08/08/2021 12:48:18 PM By: [name] Entered By: [name] on 08/08/2021 10:27:34

## 2021-08-08 NOTE — Progress Notes (Addendum)
KIWAN, GADSDEN ([id number]) Visit Report for 08/08/2021 Chief Complaint Document Details Patient Name: Chad [name], Chad [name] Date of Service: 08/08/2021 9:45 AM Medical Record Number: [id number] Patient Account Number: [id number] Date of Birth/Sex: 1960-11-04 (61 y.o. M) Treating RN: [name] Primary Care Provider: SYSTEM, PCP Other Clinician: Referring Provider: Clayborn Bigness Treating Provider/Extender: Skipper Cliche in Treatment: 51 Information Obtained from: Patient Chief Complaint Left ankle and right foot ulcers Electronic Signature(s) Signed: 08/08/2021 10:05:15 AM By: Worthy Keeler PA-C Entered By: Worthy Keeler on 08/08/2021 10:05:15 Chad [name] ([id number]) -------------------------------------------------------------------------------- Debridement Details Patient Name: Chad [name] Date of Service: 08/08/2021 9:45 AM Medical Record Number: [id number] Patient Account Number: [id number] Date of Birth/Sex: 28-Feb-1961 (61 y.o. M) Treating RN: [name] Primary Care Provider: SYSTEM, PCP Other Clinician: Referring Provider: Clayborn Bigness Treating Provider/Extender: Skipper Cliche in Treatment: 51 Debridement Performed for Wound #8 Left,Medial Ankle Assessment: Performed By: Physician Tommie Sams., PA-C Debridement Type: Debridement Severity of Tissue Pre Debridement: Fat layer exposed Level of Consciousness (Pre- Awake and Alert procedure): Pre-procedure Verification/Time Out Yes - 10:43 Taken: Pain Control: Lidocaine 4% Topical Solution Total Area Debrided (L x W): 0.5 (cm) x 0.3 (cm) = 0.15 (cm) Tissue and other material Viable, Non-Viable, Slough, Subcutaneous, Skin: Epidermis, Biofilm, Slough debrided: Level: Skin/Subcutaneous Tissue Debridement Description: Excisional Instrument: Curette Bleeding: Minimum Hemostasis Achieved: Pressure Response to Treatment: Procedure was tolerated well Level of Consciousness (Post- Awake and  Alert procedure): Post Debridement Measurements of Total Wound Length: (cm) 0.5 Width: (cm) 0.3 Depth: (cm) 0.1 Volume: (cm) 0.012 Character of Wound/Ulcer Post Debridement: Stable Severity of Tissue Post Debridement: Fat layer exposed Post Procedure Diagnosis Same as Pre-procedure Electronic Signature(s) Signed: 08/08/2021 12:48:18 PM By: [name] Signed: 08/09/2021 9:05:45 AM By: Worthy Keeler PA-C Entered By: [name] on 08/08/2021 10:46:34 Chad [name] ([id number]) -------------------------------------------------------------------------------- HPI Details Patient Name: Chad [name] Date of Service: 08/08/2021 9:45 AM Medical Record Number: [id number] Patient Account Number: [id number] Date of Birth/Sex: May 14, 1961 (61 y.o. M) Treating RN: [name] Primary Care Provider: SYSTEM, PCP Other Clinician: Referring Provider: Clayborn Bigness Treating Provider/Extender: Skipper Cliche in Treatment: 51 History of Present Illness HPI Description: 10/08/18 on evaluation today patient actually presents to our office for initial evaluation concerning wounds that he has of the bilateral lower extremities. 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 He also has bilateral venous stasis. He apparently has been homeless currently following discharge from the hospital apparently he has been placed at [facility] which is is a skilled nursing facility locally. Nonetheless fortunately he does not show any signs of infection at this time which is good news. In fact several of the wound actually appears to be showing some signs of improvement already in my pinion. There are a couple areas in the left leg in particular there likely gonna require some sharp debridement to help clear away some necrotic tissue and help  with more sufficient healing. No fevers, chills, nausea, or vomiting noted at this time. 10/15/18 on evaluation today patient actually appears to be doing very well in regard to his bilateral lower extremities. He's been tolerating the dressing changes without complication. Fortunately there does not appear to be any evidence of active infection at this time which is great news. Overall I'm actually very pleased with how this has progressed in just one visits time. Readmission: 08/14/2020 upon  evaluation today patient presents for re-evaluation here in our clinic. He is having issues with his left ankle region as well as his right toe and his right heel. He tells me that the toe and heel actually began as a area that was itching that he was scratching and then subsequently opened up into wounds. These may have been abscess areas I presume based on what I am seeing currently. With regard to his left ankle region he tells me this was a similar type occurrence although he does have venous stasis this very well may be more of a venous leg ulcer more than anything. Nonetheless I do believe that the patient would benefit from appropriate and aggressive wound care to try to help get things under better control here. He does have history of a stroke on the left side affecting him to some degree there that he is able to stand although he does have some residual weakness. Otherwise again the patient does have chronic venous insufficiency as previously noted. His arterial studies most recently obtained showed that he had an ABI on the right of 1.16 with a TBI of 0.52 and on the left and ABI of 1.14 with a TBI of 0.81. That was obtained on 06/19/2020. 08/28/2020 upon evaluation today patient appears to be doing decently well in regard to his wounds in general. He has been tolerating the dressing changes without complication. Fortunately there does not appear to be any signs of active infection which is great news.  With that being said I think the [facility] is doing a good job I would recommend that we likely continue with that currently. 09/11/2020 upon evaluation today patient's wounds did not appear to be doing too poorly but again he is not really showing signs of significant improvement with regard to any of the wounds on the right. None of them have Hydrofera Blue on them I am not exactly sure why this is not being followed as the facility did not contact us to let us know of any issues with obtaining dressings or otherwise. With that being said he is supposed to be using Hydrofera Blue on both of the wounds on the right foot as well as the ankle wound on the left side. 09/18/2020 upon evaluation today patient appears to be doing poorly with regard to his wounds. Again right now the left ankle in particular showed signs of extreme maceration. Apparently he was told by someone with staff at [facility] they could not get the [facility]. With that being said this is something that is never been relayed to [place] one way or another. Also the patient subsequently has not supposed to have a border gauze dressing on. He should have an ABD pad and roll gauze to secure as this drains much too much just to have a border gauze dressing to cover. Nonetheless the fact that they are not using the appropriate dressing is directly causing deterioration of the left ankle wound it is significantly worse today compared to what it was previous. I did attempt to call [facility] while the patient was here I called three times and got no one to even pick up the phone. After this I had my for an office coordinator call and she was able to finally get through and leave a message with the D ON as of dictation of this note which is roughly about an hour and a half later I still have not been able to speak with anyone at  the facility. 09/25/2020 upon evaluation today patient actually showing signs of good  improvement which is excellent news. He has been tolerating the dressing changes without complication. Fortunately there is no signs of active infection which is great news. No fevers, chills, nausea, vomiting, or diarrhea. I do feel like the facility has been doing a much better job at taking care of him as far as the dressings are concerned. However the director of nursing never did call me back. 10/09/2020 upon evaluation today patient appears to be doing well with regard to his wound. The toe ulcer did require some debridement but the other 2 areas actually appear to be doing quite well. 10/19/2020 upon evaluation today patient actually appears to be doing very well in regard to his wounds. In fact the heel does appear to be completely healed. The toe is doing better in the medial ankle on the left is also doing better. Overall I think he is headed in the right direction. 10/26/2020 upon evaluation today patient appears to be doing well with regard to his wound. He is showing signs of improvement which is great news and overall I am very pleased with where things stand today. No fevers, chills, nausea, vomiting, or diarrhea. 11/02/2020 upon evaluation today patient appears to be doing well with regard to his wounds. He has been tolerating the dressing changes without complication overall I am extremely pleased with where things stand today. He in regard to the toe is almost completely healed and the medial ankle on the left is doing much better. 11/09/2020 upon evaluation today patient appears to be doing a little poorly in regard to his left medial ankle ulcer. Fortunately there does not appear to be any signs of systemic infection but unfortunately locally he does appear to be infected in fact he has blue-green drainage consistent with Pseudomonas. Chad [name], Chad [name] ([id number]) 11/16/2020 upon evaluation today patient appears to be doing well with regard to his wound. It actually appears to be doing  better. I did place him on gentamicin cream since the Cipro was actually resistant even though he was positive for Pseudomonas on culture. Overall I think that he does seem to be doing better though I am unsure whether or not they have actually been putting the cream on. The patient is not sure that we did talk to the nurse directly and she was going to initiate that treatment. Fortunately there does not appear to be any signs of active infection at this time. No fevers, chills, nausea, vomiting, or diarrhea. 4/28; the area on the right second toe is close to healed. Left medial ankle required debridement 12/07/2020 upon evaluation today patient appears to be doing well with regard to his wounds. In fact the right second toe appears to be completely healed which is great news. Fortunately there does not appear to be any signs of active infection at this time which is also great news. I think we can probably discontinue the gentamicin on top of everything else. 12/14/2020 upon evaluation today patient appears to be doing well with regard to his wound. He is making good progress and overall very pleased with where things stand today. There is no signs of active infection at this time which is great news. 12/28/2020 upon evaluation today patient appears to be doing well with regard to his wounds. He has been tolerating the dressing changes without complication. Fortunately there is no signs of active infection at this time. No fevers, chills, nausea, vomiting, or diarrhea. 12/28/2020 upon  evaluation today patient's wound bed actually showed signs of excellent improvement. He has great epithelization and granulation I do not see any signs of infection overall I am extremely pleased with where things stand at this point. No fevers, chills, nausea, vomiting, or diarrhea. 01/11/2021 upon evaluation today patient appears to be doing well with regard to his wound on his leg. He has been tolerating the  dressing changes without complication. Fortunately there does not appear to be any signs of active infection which is great news. No fevers, chills, nausea, vomiting, or diarrhea. 01/25/2021 upon evaluation today patient appears to be doing well with regard to his wound. He has been tolerating the dressing changes without complication. Fortunately the collagen seems to be doing a great job which is excellent news. No fevers, chills, nausea, vomiting, or diarrhea. 02/08/2021 upon evaluation today patient's wound is actually looking a little bit worse especially in the periwound compared to previous. Fortunately there does not appear to be any signs of infection which is great news with that being said he does have some irritation around the periphery of the wound which has me more concerned. He actually had a dressing on that had not been changed in 3 days. He also is supposed to have daily dressing changes. With regard to the dressing applied he had a silver alginate dressing and silver collagen is what is recommended and ordered. He also had no Desitin around the edges of the wound in the periwound region although that is on the order inspect to be done as well. In general I was very concerned I did contact [facility] actually spoke with Magda Paganini who is the scheduling individual and subsequently she stated that she would pass the information to the D ON apparently the D ON was not available to talk to me when I call today. 02/18/2021 upon evaluation today patient's wound is actually showing signs of improvement. Fortunately there does not appear to be any evidence of infection which is great news overall I am extremely pleased with where things stand today. No fevers, chills, nausea, vomiting, or diarrhea. 8/3; patient presents for 1 week follow-up. He has no issues or complaints today. He denies signs of infection. 03/11/2021 upon evaluation today patient appears to be doing well with regard to  his wound. He does have a little bit of slough noted on the surface of the wound but fortunately there does not appear to be any signs of active infection at this time. No fevers, chills, nausea, vomiting, or diarrhea. 03/18/2021 upon evaluation today patient appears to be doing well with regard to his wound. He has been tolerating the dressing changes without complication. There was a little irritation more proximal to where the wound was that was not noted last week but nonetheless this is very superficial just seems to be more irritation we just need to make sure to put a good amount of the zinc over the area in my opinion. Otherwise he does not seem to be doing significantly worse at all which is great news. 03/25/2021 upon evaluation today patient appears to be doing well with regard to his wound. He is going require some sharp debridement today to clear with some of the necrotic debris. I did perform this today without complication postdebridement wound bed appears to be doing much better this is great news. 04/08/2021 upon evaluation today patient appears to be doing decently well in regard to his wound although the overall measurement is not significantly smaller compared to previous.  It is gone down a little bit but still the facility continues to not really put the appropriate dressings in place in fact he was supposed to have collagen we think he probably had more of an allergy to At this point. Fortunately there does not appear to be any signs of active infection systemically though locally I do not see anything on initial visualization either as far as erythema or warmth. 04/15/2021 upon evaluation today patient appears to be doing well with regard to his wound. He is actually showing signs of improvement. I did place him on antibiotics last week, Cipro. He has been taking that 2 times a day and seems to be tolerating it very well. I do not see any evidence of worsening and in fact the  overall appearance of the wound is smaller today which is also great news. 9/26; left medial ankle chronic venous insufficiency wound is improved. Using Hydrofera Blue 10/10; left medial ankle chronic venous insufficiency. Wound has not changed much in appearance completely nonviable surface. Apparently there have been problems getting the right product on the wound at the facility although he came in with [facility] on today 05/14/2021 upon evaluation today patient appears to be doing well with regard to his wound. I think he is making progress here which is good news. Fortunately there does not appear to be any signs of active infection at this time. No fevers, chills, nausea, vomiting, or diarrhea. 05/20/2021 upon evaluation today patient appears to be doing well with regard to his wound. He is showing signs of good improvement which is great news. There does not appear to be any evidence of active infection which is also excellent news. No fevers, chills, nausea, vomiting, or diarrhea. 05/28/2021 upon evaluation today patient appears to be doing quite well. There does not appear to be any signs of active infection at this time which is great news. Overall I am extremely pleased with where things stand today. I think he is headed in the right direction. 06/11/2021 upon evaluation today patient appears to be doing well with regard to his left ankle ulcer and poorly in regard to the toe ulcer on the second toe right foot. This appears to show signs of joint exposure. Apparently this has been present for 1 to 2 months although he kept [facility], Chad ([id number]) forgetting to tell me about it. That is unfortunate as right now it definitely appears to be doing significantly worse than what I would like to see. There does not appear to be any signs of active infection systemically though locally I am concerned about the possibility of infection the toe is quite red. Again no one from the facility  ever contacted [place] to advise that this was going on in the interim either. 06/17/2021 upon evaluation today patient presents for follow-up I did review his x-ray which showed a navicular bone fracture I am unsure of the chronicity of this. Subsequently he also had osteomyelitis of the toe which was what I was more concerned about this did not show up on x-ray but did show up on the pathology scrapings. This was listed as acute osteomyelitis. Nonetheless at this point I think that the antibiotic treatment is the best regimen to go with currently. The patient is in agreement with that plan. Nonetheless he has initially 30 days of doxycycline off likely extend that towards the end of the treatment cycle that will be around the middle of December for an additional 2 weeks. That all depends  on how well he continues to heal. Nonetheless based on what I am seeing in the foot I did want a proceed with an MRI as well which I think will be helpful to identify if there is anything else that needs to be addressed from the standpoint of infection. 06/24/2021 upon evaluation today patient appears to be doing pretty well in regards to his wounds. I think both are actually showing signs of improvement which is good I did review his MRI today which did show signs of osteomyelitis of the middle and proximal phalanx on his right foot of the affected toe. With that being said this is actually showing signs of significant improvement today already with the antibiotic therapy I think the redness is also improved. Overall I think that we just need to give this some time with appropriate wound care we will see how things go potentially hyperbarics could be considered. 07/02/2021 upon inspection today patient actually appears to be doing well in regard to his left ankle which is getting very close to complete resolution of pleased in that regard. Unfortunately he is continuing to have issues with his second toe right foot and  this seems to still be very painful for him. Recommend he try something different from the standpoint of antibiotics. 07/15/2021 upon evaluation today patient appears to be doing actually pretty well in regard to his foot. This is actually showing signs of significant improvement which is great news. Overall I feel like the patient is improving both in regard to the second toe as well as the ankle on the left. With that being said the biggest issue that I do see currently is that he is needing to have a refill of the doxycycline that we previously treated him with. He also did see podiatry they are not going to recommend any amputation at this point since he seems to be doing quite well. For that reason we just need to keep things under control from an infection standpoint. 08/01/2021 upon evaluation today patient appears to be doing well with regard to his wound. He has been tolerating the dressing changes without complication. Fortunately there does not appear to be any evidence of active infection locally nor systemically at this point. In fact I think everything is doing excellent in fact his second toe on the right foot is almost healed and the ankle on the left ankle region is actually very close to being healed as well. 08/08/2021 upon evaluation today patient appears to be doing well with regard to his wound. He has been tolerating the dressing changes without complication. Fortunately I do not see any signs of active infection at this time. Electronic Signature(s) Signed: 08/08/2021 10:50:51 AM By: Worthy Keeler PA-C Entered By: Worthy Keeler on 08/08/2021 10:50:51 Chad [name] ([id number]) -------------------------------------------------------------------------------- Physical Exam Details Patient Name: Chad [name] Date of Service: 08/08/2021 9:45 AM Medical Record Number: [id number] Patient Account Number: [id number] Date of Birth/Sex: May 22, 1961 (60 y.o. M) Treating RN:  [name] Primary Care Provider: SYSTEM, PCP Other Clinician: Referring Provider: Clayborn Bigness Treating Provider/Extender: Skipper Cliche in Treatment: 86 Constitutional Well-nourished and well-hydrated in no acute distress. Respiratory normal breathing without difficulty. Psychiatric this patient is able to make decisions and demonstrates good insight into disease process. Alert and Oriented x 3. pleasant and cooperative. Notes Upon inspection patient's wound on the right second toe is completely healed. On the left medial ankle this appears to be doing well but there is still a lot of dry  skin around the edges of the wound. I think that he may benefit from using the Xeroform gauze on this area as well and did so well for so I am willing to give this a try and see if we get this closed up finally in for good. Electronic Signature(s) Signed: 08/08/2021 1:24:47 PM By: Worthy Keeler PA-C Entered By: Worthy Keeler on 08/08/2021 13:24:46 Chad [name] ([id number]) -------------------------------------------------------------------------------- Physician Orders Details Patient Name: Chad [name] Date of Service: 08/08/2021 9:45 AM Medical Record Number: [id number] Patient Account Number: [id number] Date of Birth/Sex: 1961-04-28 (60 y.o. 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 o Nurse Visit as needed Bathing/ Shower/ Hygiene o May shower;  gently cleanse wound with antibacterial soap, rinse and pat dry prior to dressing wounds Edema Control - Lymphedema / Segmental Compressive Device / Other o Elevate legs to the level of the heart and pump ankles as often as possible o Elevate leg(s) parallel to the floor when sitting. Non-Wound Condition o Additional non-wound orders/instructions: - patient needs toenails clipped asap. Band aid to healed right second toe, band aid to left great toe for protection. Wound Treatment Wound #8 - Ankle Wound Laterality: Left, Medial Cleanser: Normal Saline 3 x Per Week/30 Days Discharge Instructions: Wash your hands with soap and water. Remove old dressing, discard into plastic bag and place into trash. Cleanse the wound with Normal Saline prior to applying a clean dressing using gauze sponges, not tissues or cotton balls. Do not scrub or use excessive force. Pat dry using gauze sponges, not tissue or cotton balls. Peri-Wound Care: Desitin Maximum Strength Ointment 4 (oz) 3 x Per Week/30 Days Discharge Instructions: Apply zinc periwound thin layer Primary Dressing: Xeroform-HBD 2x2 (in/in) 3 x Per Week/30 Days Discharge Instructions: Apply Xeroform-HBD 2x2 (in/in) as directed Secondary Dressing: Kerlix 4.5 x 4.1 (in/yd) 3 x Per Week/30 Days Discharge Instructions: Apply kerlix over ABD pad Secured With: 75M Medipore H Soft Cloth Surgical Tape, 2x2 (in/yd) 3 x Per Week/30 Days Discharge Instructions: Secure kerlix Electronic Signature(s) Signed: 08/08/2021 12:48:18 PM By: [name] Signed: 08/09/2021 9:05:45 AM By: Worthy Keeler PA-C Entered By: [name] on 08/08/2021 10:48:20 Chad [name] ([id number]) -------------------------------------------------------------------------------- Problem List Details Patient Name: Chad [name] Date of Service: 08/08/2021 9:45 AM Medical Record Number: [id number] Patient Account Number: [id number] Date of Birth/Sex: 01/16/1961 (61 y.o.  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 08/14/2020 No Yes non-dominant side Inactive Problems ICD-10 Code Description Active Date Inactive Date L97.412 Non-pressure chronic ulcer of right heel and midfoot with fat layer exposed 08/14/2020 08/14/2020 L97.512 Non-pressure chronic ulcer of other part of right foot with fat layer exposed 08/14/2020 08/14/2020 Resolved Problems Electronic Signature(s) Signed:  08/08/2021 10:05:08 AM By: Worthy Keeler PA-C Entered By: Worthy Keeler on 08/08/2021 10:05:08 Chad [name] ([id number]) -------------------------------------------------------------------------------- Progress Note Details Patient Name: Chad [name] Date of Service: 08/08/2021 9:45 AM Medical Record Number: [id number] Patient Account Number: [id number] Date of Birth/Sex: October 30, 1960 (60 y.o. M) Treating RN: [name] Primary Care Provider: SYSTEM, PCP Other Clinician: Referring Provider: Clayborn Bigness Treating Provider/Extender: Skipper Cliche in Treatment: 51 Subjective Chief Complaint Information obtained from Patient Left ankle and right foot ulcers History of Present Illness (HPI) 10/08/18 on evaluation today patient actually presents to our office for initial evaluation concerning wounds that he has of the bilateral lower extremities. 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 He also has bilateral venous stasis. He apparently has been homeless currently following discharge from the hospital apparently he has been placed at [facility] which is is a skilled nursing facility locally. Nonetheless fortunately he does not show any signs of infection at this time which is good news. In fact several of the wound actually appears to be showing some signs of improvement already in my pinion. There are a couple areas in the left leg in particular there likely gonna require some sharp debridement to help clear away some necrotic tissue and help with more sufficient healing. No fevers, chills, nausea, or vomiting noted at this time. 10/15/18 on evaluation today patient actually appears to be doing very well in regard to his bilateral lower extremities. He's been tolerating the dressing changes without complication. Fortunately there does not appear to be any evidence of active infection at this time which is great news. Overall I'm actually very pleased with how this has progressed in just one visits time. Readmission: 08/14/2020 upon evaluation today patient presents for re-evaluation here in our clinic. He is having issues with his left ankle region as well as his right toe and his right heel. He tells me that the toe and heel actually began as a area that was itching that he was scratching and then subsequently opened up into wounds. These may have been abscess areas I presume based on what I am seeing currently. With regard to his left ankle region he tells me this was a similar type occurrence although he does have venous stasis this very well may be more of a venous leg ulcer more than anything. Nonetheless I do believe that the patient would benefit from appropriate and aggressive wound care to try to help get things under better control here. He does have history of a stroke on the left  side affecting him to some degree there that he is able to stand although he does have some residual weakness. Otherwise again the patient does have chronic venous insufficiency as previously noted. His arterial studies most recently obtained showed that he had an ABI on the right of 1.16 with a TBI of 0.52 and on the left and ABI of 1.14 with a TBI of 0.81. That was obtained on 06/19/2020. 08/28/2020 upon evaluation today patient appears to be doing decently well in regard to his wounds in general. He has been tolerating the dressing changes without complication. Fortunately there does not appear to be any signs of active infection which is great news. With that being said I think the [facility] is doing a good job I would recommend that we likely continue with that  currently. 09/11/2020 upon evaluation today patient's wounds did not appear to be doing too poorly but again he is not really showing signs of significant improvement with regard to any of the wounds on the right. None of them have Hydrofera Blue on them I am not exactly sure why this is not being followed as the facility did not contact us to let us know of any issues with obtaining dressings or otherwise. With that being said he is supposed to be using Hydrofera Blue on both of the wounds on the right foot as well as the ankle wound on the left side. 09/18/2020 upon evaluation today patient appears to be doing poorly with regard to his wounds. Again right now the left ankle in particular showed signs of extreme maceration. Apparently he was told by someone with staff at [facility] they could not get the [facility]. With that being said this is something that is never been relayed to [place] one way or another. Also the patient subsequently has not supposed to have a border gauze dressing on. He should have an ABD pad and roll gauze to secure as this drains much too much just to have a border gauze dressing to cover. Nonetheless  the fact that they are not using the appropriate dressing is directly causing deterioration of the left ankle wound it is significantly worse today compared to what it was previous. I did attempt to call [facility] while the patient was here I called three times and got no one to even pick up the phone. After this I had my for an office coordinator call and she was able to finally get through and leave a message with the D ON as of dictation of this note which is roughly about an hour and a half later I still have not been able to speak with anyone at the facility. 09/25/2020 upon evaluation today patient actually showing signs of good improvement which is excellent news. He has been tolerating the dressing changes without complication. Fortunately there is no signs of active infection which is great news. No fevers, chills, nausea, vomiting, or diarrhea. I do feel like the facility has been doing a much better job at taking care of him as far as the dressings are concerned. However the director of nursing never did call me back. 10/09/2020 upon evaluation today patient appears to be doing well with regard to his wound. The toe ulcer did require some debridement but the other 2 areas actually appear to be doing quite well. 10/19/2020 upon evaluation today patient actually appears to be doing very well in regard to his wounds. In fact the heel does appear to be completely healed. The toe is doing better in the medial ankle on the left is also doing better. Overall I think he is headed in the right direction. 10/26/2020 upon evaluation today patient appears to be doing well with regard to his wound. He is showing signs of improvement which is great news and overall I am very pleased with where things stand today. No fevers, chills, nausea, vomiting, or diarrhea. 11/02/2020 upon evaluation today patient appears to be doing well with regard to his wounds. He has been tolerating the dressing changes  without complication overall I am extremely pleased with where things stand today. He in regard to the toe is almost completely healed and the medial [facility], Chad ([id number]) ankle on the left is doing much better. 11/09/2020 upon evaluation today patient appears to be doing a little  poorly in regard to his left medial ankle ulcer. Fortunately there does not appear to be any signs of systemic infection but unfortunately locally he does appear to be infected in fact he has blue-green drainage consistent with Pseudomonas. 11/16/2020 upon evaluation today patient appears to be doing well with regard to his wound. It actually appears to be doing better. I did place him on gentamicin cream since the Cipro was actually resistant even though he was positive for Pseudomonas on culture. Overall I think that he does seem to be doing better though I am unsure whether or not they have actually been putting the cream on. The patient is not sure that we did talk to the nurse directly and she was going to initiate that treatment. Fortunately there does not appear to be any signs of active infection at this time. No fevers, chills, nausea, vomiting, or diarrhea. 4/28; the area on the right second toe is close to healed. Left medial ankle required debridement 12/07/2020 upon evaluation today patient appears to be doing well with regard to his wounds. In fact the right second toe appears to be completely healed which is great news. Fortunately there does not appear to be any signs of active infection at this time which is also great news. I think we can probably discontinue the gentamicin on top of everything else. 12/14/2020 upon evaluation today patient appears to be doing well with regard to his wound. He is making good progress and overall very pleased with where things stand today. There is no signs of active infection at this time which is great news. 12/28/2020 upon evaluation today patient appears to be doing  well with regard to his wounds. He has been tolerating the dressing changes without complication. Fortunately there is no signs of active infection at this time. No fevers, chills, nausea, vomiting, or diarrhea. 12/28/2020 upon evaluation today patient's wound bed actually showed signs of excellent improvement. He has great epithelization and granulation I do not see any signs of infection overall I am extremely pleased with where things stand at this point. No fevers, chills, nausea, vomiting, or diarrhea. 01/11/2021 upon evaluation today patient appears to be doing well with regard to his wound on his leg. He has been tolerating the dressing changes without complication. Fortunately there does not appear to be any signs of active infection which is great news. No fevers, chills, nausea, vomiting, or diarrhea. 01/25/2021 upon evaluation today patient appears to be doing well with regard to his wound. He has been tolerating the dressing changes without complication. Fortunately the collagen seems to be doing a great job which is excellent news. No fevers, chills, nausea, vomiting, or diarrhea. 02/08/2021 upon evaluation today patient's wound is actually looking a little bit worse especially in the periwound compared to previous. Fortunately there does not appear to be any signs of infection which is great news with that being said he does have some irritation around the periphery of the wound which has me more concerned. He actually had a dressing on that had not been changed in 3 days. He also is supposed to have daily dressing changes. With regard to the dressing applied he had a silver alginate dressing and silver collagen is what is recommended and ordered. He also had no Desitin around the edges of the wound in the periwound region although that is on the order inspect to be done as well. In general I was very concerned I did contact [facility] actually spoke with Magda Paganini  who is the scheduling  individual and subsequently she stated that she would pass the information to the D ON apparently the D ON was not available to talk to me when I call today. 02/18/2021 upon evaluation today patient's wound is actually showing signs of improvement. Fortunately there does not appear to be any evidence of infection which is great news overall I am extremely pleased with where things stand today. No fevers, chills, nausea, vomiting, or diarrhea. 8/3; patient presents for 1 week follow-up. He has no issues or complaints today. He denies signs of infection. 03/11/2021 upon evaluation today patient appears to be doing well with regard to his wound. He does have a little bit of slough noted on the surface of the wound but fortunately there does not appear to be any signs of active infection at this time. No fevers, chills, nausea, vomiting, or diarrhea. 03/18/2021 upon evaluation today patient appears to be doing well with regard to his wound. He has been tolerating the dressing changes without complication. There was a little irritation more proximal to where the wound was that was not noted last week but nonetheless this is very superficial just seems to be more irritation we just need to make sure to put a good amount of the zinc over the area in my opinion. Otherwise he does not seem to be doing significantly worse at all which is great news. 03/25/2021 upon evaluation today patient appears to be doing well with regard to his wound. He is going require some sharp debridement today to clear with some of the necrotic debris. I did perform this today without complication postdebridement wound bed appears to be doing much better this is great news. 04/08/2021 upon evaluation today patient appears to be doing decently well in regard to his wound although the overall measurement is not significantly smaller compared to previous. It is gone down a little bit but still the facility continues to not really put the  appropriate dressings in place in fact he was supposed to have collagen we think he probably had more of an allergy to At this point. Fortunately there does not appear to be any signs of active infection systemically though locally I do not see anything on initial visualization either as far as erythema or warmth. 04/15/2021 upon evaluation today patient appears to be doing well with regard to his wound. He is actually showing signs of improvement. I did place him on antibiotics last week, Cipro. He has been taking that 2 times a day and seems to be tolerating it very well. I do not see any evidence of worsening and in fact the overall appearance of the wound is smaller today which is also great news. 9/26; left medial ankle chronic venous insufficiency wound is improved. Using Hydrofera Blue 10/10; left medial ankle chronic venous insufficiency. Wound has not changed much in appearance completely nonviable surface. Apparently there have been problems getting the right product on the wound at the facility although he came in with [facility] on today 05/14/2021 upon evaluation today patient appears to be doing well with regard to his wound. I think he is making progress here which is good news. Fortunately there does not appear to be any signs of active infection at this time. No fevers, chills, nausea, vomiting, or diarrhea. 05/20/2021 upon evaluation today patient appears to be doing well with regard to his wound. He is showing signs of good improvement which is great news. There does not appear to be any  evidence of active infection which is also excellent news. No fevers, chills, nausea, vomiting, or diarrhea. Chad [name], Chad [name] ([id number]) 05/28/2021 upon evaluation today patient appears to be doing quite well. There does not appear to be any signs of active infection at this time which is great news. Overall I am extremely pleased with where things stand today. I think he is headed in the right  direction. 06/11/2021 upon evaluation today patient appears to be doing well with regard to his left ankle ulcer and poorly in regard to the toe ulcer on the second toe right foot. This appears to show signs of joint exposure. Apparently this has been present for 1 to 2 months although he kept forgetting to tell me about it. That is unfortunate as right now it definitely appears to be doing significantly worse than what I would like to see. There does not appear to be any signs of active infection systemically though locally I am concerned about the possibility of infection the toe is quite red. Again no one from the facility ever contacted [place] to advise that this was going on in the interim either. 06/17/2021 upon evaluation today patient presents for follow-up I did review his x-ray which showed a navicular bone fracture I am unsure of the chronicity of this. Subsequently he also had osteomyelitis of the toe which was what I was more concerned about this did not show up on x-ray but did show up on the pathology scrapings. This was listed as acute osteomyelitis. Nonetheless at this point I think that the antibiotic treatment is the best regimen to go with currently. The patient is in agreement with that plan. Nonetheless he has initially 30 days of doxycycline off likely extend that towards the end of the treatment cycle that will be around the middle of December for an additional 2 weeks. That all depends on how well he continues to heal. Nonetheless based on what I am seeing in the foot I did want a proceed with an MRI as well which I think will be helpful to identify if there is anything else that needs to be addressed from the standpoint of infection. 06/24/2021 upon evaluation today patient appears to be doing pretty well in regards to his wounds. I think both are actually showing signs of improvement which is good I did review his MRI today which did show signs of osteomyelitis of the middle and  proximal phalanx on his right foot of the affected toe. With that being said this is actually showing signs of significant improvement today already with the antibiotic therapy I think the redness is also improved. Overall I think that we just need to give this some time with appropriate wound care we will see how things go potentially hyperbarics could be considered. 07/02/2021 upon inspection today patient actually appears to be doing well in regard to his left ankle which is getting very close to complete resolution of pleased in that regard. Unfortunately he is continuing to have issues with his second toe right foot and this seems to still be very painful for him. Recommend he try something different from the standpoint of antibiotics. 07/15/2021 upon evaluation today patient appears to be doing actually pretty well in regard to his foot. This is actually showing signs of significant improvement which is great news. Overall I feel like the patient is improving both in regard to the second toe as well as the ankle on the left. With that being said the biggest issue that I  do see currently is that he is needing to have a refill of the doxycycline that we previously treated him with. He also did see podiatry they are not going to recommend any amputation at this point since he seems to be doing quite well. For that reason we just need to keep things under control from an infection standpoint. 08/01/2021 upon evaluation today patient appears to be doing well with regard to his wound. He has been tolerating the dressing changes without complication. Fortunately there does not appear to be any evidence of active infection locally nor systemically at this point. In fact I think everything is doing excellent in fact his second toe on the right foot is almost healed and the ankle on the left ankle region is actually very close to being healed as well. 08/08/2021 upon evaluation today patient appears to be  doing well with regard to his wound. He has been tolerating the dressing changes without complication. Fortunately I do not see any signs of active infection at this time. Objective Constitutional Well-nourished and well-hydrated in no acute distress. Vitals Time Taken: 10:26 AM, Height: 67 in, Weight: 160 lbs, BMI: 25.1, Temperature: 98.3 F, Pulse: 93 bpm, Respiratory Rate: 18 breaths/min, Blood Pressure: 108/67 mmHg. Respiratory normal breathing without difficulty. Psychiatric this patient is able to make decisions and demonstrates good insight into disease process. Alert and Oriented x 3. pleasant and cooperative. General Notes: Upon inspection patient's wound on the right second toe is completely healed. On the left medial ankle this appears to be doing well but there is still a lot of dry skin around the edges of the wound. I think that he may benefit from using the Xeroform gauze on this area as well and did so well for so I am willing to give this a try and see if we get this closed up finally in for good. Integumentary (Hair, Skin) Wound #8 status is Open. Original cause of wound was Gradually Appeared. The date acquired was: 07/12/2019. The wound has been in treatment 51 weeks. The wound is located on the Left,Medial Ankle. The wound measures 0.5cm length x 0.3cm width x 0.1cm depth; 0.118cm^2 area and 0.012cm^3 volume. There is Fat Layer (Subcutaneous Tissue) exposed. There is no tunneling or undermining noted. There is a medium amount of serosanguineous drainage noted. The wound margin is flat and intact. There is small (1-33%) pink, pale granulation within the wound bed. There is a large (67-100%) amount of necrotic tissue within the wound bed including Eschar. Wound #9 status is Healed - Epithelialized. Original cause of wound was Gradually Appeared. The date acquired was: 05/31/2021. The wound has been in treatment 8 weeks. The wound is located on the Right Toe Second. The wound  measures 0cm length x 0cm width x 0cm depth; 0cm^2 Bartolucci, Chad ([id number]) area and 0cm^3 volume. There is bone and joint exposed. There is no tunneling or undermining noted. There is a medium amount of serosanguineous drainage noted. The wound margin is flat and intact. There is no granulation within the wound bed. There is a large (67-100%) amount of necrotic tissue within the wound bed including Eschar. 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 There was a Excisional Skin/Subcutaneous Tissue Debridement with a total area of 0.15 sq cm performed by Tommie Sams., PA-C. With the following instrument(s): Curette to remove Viable and Non-Viable tissue/material. Material removed includes Subcutaneous Tissue, Slough, Skin: Epidermis, and Biofilm after achieving pain control using Lidocaine 4% Topical Solution. No specimens were taken. A time out was conducted at 10:43, prior to the start of the procedure. A Minimum amount of bleeding was controlled with Pressure. The procedure was tolerated well. Post Debridement Measurements: 0.5cm length x 0.3cm width x 0.1cm depth; 0.012cm^3 volume. Character of Wound/Ulcer Post Debridement is stable. Severity of Tissue Post Debridement is: Fat layer exposed. Post procedure Diagnosis Wound #8: Same as Pre-Procedure Plan Follow-up Appointments: Return Appointment in 1 week. Nurse Visit as needed Bathing/ Shower/ Hygiene: May shower; gently cleanse wound with antibacterial soap, rinse and pat dry prior to dressing wounds Edema Control - Lymphedema / Segmental  Compressive Device / Other: Elevate legs to the level of the heart and pump ankles as often as possible Elevate leg(s) parallel to the floor when sitting. Non-Wound Condition: Additional non-wound orders/instructions: - patient needs toenails clipped asap. Band aid to healed right second toe, band aid to left great toe for protection. WOUND #8: - Ankle Wound Laterality: Left, Medial Cleanser: Normal Saline 3 x Per Week/30 Days Discharge Instructions: Wash your hands with soap and water. Remove old dressing, discard into plastic bag and place into trash. Cleanse the wound with Normal Saline prior to applying a clean dressing using gauze sponges, not tissues or cotton balls. Do not scrub or use excessive force. Pat dry using gauze sponges, not tissue or cotton balls. Peri-Wound Care: Desitin Maximum Strength Ointment 4 (oz) 3 x Per Week/30 Days Discharge Instructions: Apply zinc periwound thin layer Primary Dressing: Xeroform-HBD 2x2 (in/in) 3 x Per Week/30 Days Discharge Instructions: Apply Xeroform-HBD 2x2 (in/in) as directed Secondary Dressing: Kerlix 4.5 x 4.1 (in/yd) 3 x Per Week/30 Days Discharge Instructions: Apply kerlix over ABD pad Secured With: 7M Medipore H Soft Cloth Surgical Tape, 2x2 (in/yd) 3 x Per Week/30 Days Discharge Instructions: Secure kerlix 1. Would recommend that we switch to Xeroform gauze dressing on the left medial ankle. 2. I am also can recommend that we discontinue wound care measures for the right second toe. 3. We will use a Band-Aid over the right second toe as well as the left first toe to help prevent this from rubbing on his shoes. We will see patient back for reevaluation in 1 week here in the clinic. If anything worsens or changes patient will contact our office for additional recommendations. Chad [name], Chad [name] ([id number]) Electronic Signature(s) Signed: 08/08/2021 1:25:26 PM By: Worthy Keeler PA-C Entered By: Worthy Keeler on 08/08/2021  13:25:25 Chad [name] ([id number]) -------------------------------------------------------------------------------- SuperBill Details Patient Name: Chad [name] Date of Service: 08/08/2021 Medical Record Number: [id number] Patient Account Number: [id number] Date of Birth/Sex: 02/06/1961 (60 y.o. 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 Facility Procedures CPT4 Code: 08657846 Description: 96295 - DEB SUBQ TISSUE 20 SQ CM/< Modifier: Quantity: 1 CPT4 Code: Description: ICD-10 Diagnosis Description M84.132 Non-pressure chronic ulcer of left ankle with fat layer exposed Modifier: Quantity:  Physician Procedures CPT4 Code: 2449753 Description: 00511 - WC PHYS SUBQ TISS 20 SQ CM Modifier: Quantity: 1 CPT4 Code: Description: ICD-10 Diagnosis Description M21.117 Non-pressure chronic ulcer of left ankle with fat layer exposed Modifier: Quantity: Electronic Signature(s) Signed: 08/08/2021 1:25:35 PM By: Worthy Keeler PA-C Entered By: Worthy Keeler on 08/08/2021 13:25:35

## 2021-08-14 ENCOUNTER — Encounter: Payer: Self-pay | Admitting: Internal Medicine

## 2021-08-14 ENCOUNTER — Inpatient Hospital Stay (HOSPITAL_BASED_OUTPATIENT_CLINIC_OR_DEPARTMENT_OTHER): Payer: Medicaid Other | Admitting: Internal Medicine

## 2021-08-14 ENCOUNTER — Inpatient Hospital Stay: Payer: Medicaid Other

## 2021-08-14 ENCOUNTER — Other Ambulatory Visit: Payer: Self-pay

## 2021-08-14 ENCOUNTER — Inpatient Hospital Stay: Payer: Medicaid Other | Attending: Internal Medicine

## 2021-08-14 DIAGNOSIS — Z15068 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 of digestive system: Secondary | ICD-10-CM

## 2021-08-14 DIAGNOSIS — Z5112 Encounter for antineoplastic immunotherapy: Secondary | ICD-10-CM | POA: Insufficient documentation

## 2021-08-14 DIAGNOSIS — C661 Malignant neoplasm of right ureter: Secondary | ICD-10-CM | POA: Diagnosis present

## 2021-08-14 DIAGNOSIS — Z7189 Other specified counseling: Secondary | ICD-10-CM

## 2021-08-14 DIAGNOSIS — Z515 Encounter for palliative care: Secondary | ICD-10-CM

## 2021-08-14 DIAGNOSIS — C182 Malignant neoplasm of ascending colon: Secondary | ICD-10-CM

## 2021-08-14 DIAGNOSIS — Z1509 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: Secondary | ICD-10-CM

## 2021-08-14 LAB — CBC WITH DIFFERENTIAL/PLATELET
Abs Immature Granulocytes: 0.01 10*3/uL (ref 0.00–0.07)
Basophils Absolute: 0.1 10*3/uL (ref 0.0–0.1)
Basophils Relative: 1 %
Eosinophils Absolute: 0.4 10*3/uL (ref 0.0–0.5)
Eosinophils Relative: 5 %
HCT: 34.9 % — ABNORMAL LOW (ref 39.0–52.0)
Hemoglobin: 11.4 g/dL — ABNORMAL LOW (ref 13.0–17.0)
Immature Granulocytes: 0 %
Lymphocytes Relative: 19 %
Lymphs Abs: 1.4 10*3/uL (ref 0.7–4.0)
MCH: 26 pg (ref 26.0–34.0)
MCHC: 32.7 g/dL (ref 30.0–36.0)
MCV: 79.5 fL — ABNORMAL LOW (ref 80.0–100.0)
Monocytes Absolute: 0.7 10*3/uL (ref 0.1–1.0)
Monocytes Relative: 9 %
Neutro Abs: 4.9 10*3/uL (ref 1.7–7.7)
Neutrophils Relative %: 66 %
Platelets: 211 10*3/uL (ref 150–400)
RBC: 4.39 MIL/uL (ref 4.22–5.81)
RDW: 15.4 % (ref 11.5–15.5)
WBC: 7.4 10*3/uL (ref 4.0–10.5)
nRBC: 0 % (ref 0.0–0.2)

## 2021-08-14 LAB — COMPREHENSIVE METABOLIC PANEL
ALT: 19 U/L (ref 0–44)
AST: 31 U/L (ref 15–41)
Albumin: 3.9 g/dL (ref 3.5–5.0)
Alkaline Phosphatase: 101 U/L (ref 38–126)
Anion gap: 6 (ref 5–15)
BUN: 21 mg/dL (ref 8–23)
CO2: 23 mmol/L (ref 22–32)
Calcium: 8.5 mg/dL — ABNORMAL LOW (ref 8.9–10.3)
Chloride: 104 mmol/L (ref 98–111)
Creatinine, Ser: 1.05 mg/dL (ref 0.61–1.24)
GFR, Estimated: >60 mL/min (ref >60)
Glucose, Bld: 82 mg/dL (ref 70–99)
Potassium: 4.7 mmol/L (ref 3.5–5.1)
Sodium: 133 mmol/L — ABNORMAL LOW (ref 135–145)
Total Bilirubin: 0.2 mg/dL — ABNORMAL LOW (ref 0.3–1.2)
Total Protein: 7.4 g/dL (ref 6.5–8.1)

## 2021-08-14 MED ORDER — SODIUM CHLORIDE 0.9 % IV SOLN
200.0000 mg | Freq: Once | INTRAVENOUS | Status: AC
  Administered 2021-08-14: 200 mg via INTRAVENOUS
  Filled 2021-08-14: qty 8

## 2021-08-14 MED ORDER — SODIUM CHLORIDE 0.9 % IV SOLN
Freq: Once | INTRAVENOUS | Status: AC
  Filled 2021-08-14: qty 250

## 2021-08-14 MED ORDER — HEPARIN SOD (PORK) LOCK FLUSH 100 UNIT/ML IV SOLN
500.0000 [IU] | Freq: Once | INTRAVENOUS | Status: AC | PRN
  Administered 2021-08-14: 500 [IU]
  Filled 2021-08-14: qty 5

## 2021-08-14 MED ORDER — HEPARIN SOD (PORK) LOCK FLUSH 100 UNIT/ML IV SOLN
INTRAVENOUS | Status: AC
  Filled 2021-08-14: qty 5

## 2021-08-14 MED ORDER — SODIUM CHLORIDE 0.9% FLUSH
10.0000 mL | INTRAVENOUS | Status: DC | PRN
  Filled 2021-08-14: qty 10

## 2021-08-14 NOTE — Patient Instructions (Signed)
[facility] [place]  Discharge Instructions: Thank you for choosing [facility] to provide your oncology and hematology care.   If you have a lab appointment with the [facility], please go directly to the [facility] and check in at the registration area.   Wear comfortable clothing and clothing appropriate for easy access to any Portacath or PICC line.   We strive to give you quality time with your provider. You may need to reschedule your appointment if you arrive late (15 or more minutes).  Arriving late affects you and other patients whose appointments are after yours.  Also, if you miss three or more appointments without notifying the office, you may be dismissed from the clinic at the providers discretion.      For prescription refill requests, have your pharmacy contact our office and allow 72 hours for refills to be completed.    Today you received the following chemotherapy and/or immunotherapy agents [name]       To help prevent nausea and vomiting after your treatment, we encourage you to take your nausea medication as directed.  BELOW ARE SYMPTOMS THAT SHOULD BE REPORTED IMMEDIATELY: *FEVER GREATER THAN 100.4 F (38 C) OR HIGHER *CHILLS OR SWEATING *NAUSEA AND VOMITING THAT IS NOT CONTROLLED WITH YOUR NAUSEA MEDICATION *UNUSUAL SHORTNESS OF BREATH *UNUSUAL BRUISING OR BLEEDING *URINARY PROBLEMS (pain or burning when urinating, or frequent urination) *BOWEL PROBLEMS (unusual diarrhea, constipation, pain near the anus) TENDERNESS IN MOUTH AND THROAT WITH OR WITHOUT PRESENCE OF ULCERS (sore throat, sores in mouth, or a toothache) UNUSUAL RASH, SWELLING OR PAIN  UNUSUAL VAGINAL DISCHARGE OR ITCHING   Items with * indicate a potential emergency and should be followed up as soon as possible or go to the Emergency Department if any problems should occur.  Please show the CHEMOTHERAPY ALERT CARD or IMMUNOTHERAPY ALERT CARD at check-in  to the Emergency Department and triage nurse.  Should you have questions after your visit or need to cancel or reschedule your appointment, please contact [facility]  Dept: [phone]  and follow the prompts.  Office hours are 8:00 a.m. to 4:30 p.m. Monday - Friday. Please note that voicemails left after 4:00 p.m. may not be returned until the following business day.  We are closed weekends and major holidays. You have access to a nurse at all times for urgent questions. Please call the main number to the clinic Dept: [phone] and follow the prompts.   For any non-urgent questions, you may also contact your provider using MyChart. We now offer e-Visits for anyone 49 and older to request care online for non-urgent symptoms. For details visit [email or web address].   Also download the MyChart app! Go to the app store, search "MyChart", open the app, select [facility], and log in with your MyChart username and password.  Due to Covid, a mask is required upon entering the hospital/clinic. If you do not have a mask, one will be given to you upon arrival. For doctor visits, patients may have 1 support person aged 32 or older with them. For treatment visits, patients cannot have anyone with them due to current Covid guidelines and our immunocompromised population.   Pembrolizumab injection What is this medication? PEMBROLIZUMAB (pem broe liz ue mab) is a monoclonal antibody. It is used to treat certain types of cancer. This medicine may be used for other purposes; ask your health care provider or pharmacist if you have questions. COMMON BRAND NAME(S): [employer]  What should I tell my care team before I take this medication? °They need to know if you have any of these conditions: °autoimmune diseases like Crohn's disease, ulcerative colitis, or lupus °have had or planning to have an allogeneic stem cell transplant (uses someone else's stem cells) °history of organ  transplant °history of chest radiation °nervous system problems like myasthenia gravis or Guillain-Barre syndrome °an unusual or allergic reaction to pembrolizumab, other medicines, foods, dyes, or preservatives °pregnant or trying to get pregnant °breast-feeding °How should I use this medication? °This medicine is for infusion into a vein. It is given by a health care professional in a hospital or clinic setting. °A special MedGuide will be given to you before each treatment. Be sure to read this information carefully each time. °Talk to your pediatrician regarding the use of this medicine in children. While this drug may be prescribed for children as young as 6 months for selected conditions, precautions do apply. °Overdosage: If you think you have taken too much of this medicine contact a poison control center or emergency room at once. °NOTE: This medicine is only for you. Do not share this medicine with others. °What if I miss a dose? °It is important not to miss your dose. Call your doctor or health care professional if you are unable to keep an appointment. °What may interact with this medication? °Interactions have not been studied. °This list may not describe all possible interactions. Give your health care provider a list of all the medicines, herbs, non-prescription drugs, or dietary supplements you use. Also tell them if you smoke, drink alcohol, or use illegal drugs. Some items may interact with your medicine. °What should I watch for while using this medication? °Your condition will be monitored carefully while you are receiving this medicine. °You may need blood work done while you are taking this medicine. °Do not become pregnant while taking this medicine or for 4 months after stopping it. Women should inform their doctor if they wish to become pregnant or think they might be pregnant. There is a potential for serious side effects to an unborn child. Talk to your health care professional or  pharmacist for more information. Do not breast-feed an infant while taking this medicine or for 4 months after the last dose. °What side effects may I notice from receiving this medication? °Side effects that you should report to your doctor or health care professional as soon as possible: °allergic reactions like skin rash, itching or hives, swelling of the face, lips, or tongue °bloody or black, tarry °breathing problems °changes in vision °chest pain °chills °confusion °constipation °cough °diarrhea °dizziness or feeling faint or lightheaded °fast or irregular heartbeat °fever °flushing °joint pain °low blood counts - this medicine may decrease the number of white blood cells, red blood cells and platelets. You may be at increased risk for infections and bleeding. °muscle pain °muscle weakness °pain, tingling, numbness in the hands or feet °persistent headache °redness, blistering, peeling or loosening of the skin, including inside the mouth °signs and symptoms of high blood sugar such as dizziness; dry mouth; dry skin; fruity breath; nausea; stomach pain; increased hunger or thirst; increased urination °signs and symptoms of kidney injury like trouble passing urine or change in the amount of urine °signs and symptoms of liver injury like dark urine, light-colored stools, loss of appetite, nausea, right upper belly pain, yellowing of the eyes or skin °sweating °swollen lymph nodes °weight loss °Side effects that usually do not require   medical attention (report to your doctor or health care professional if they continue or are bothersome): decreased appetite hair loss tiredness This list may not describe all possible side effects. Call your doctor for medical advice about side effects. You may report side effects to FDA at 1-800-FDA-1088. Where should I keep my medication? This drug is given in a hospital or clinic and will not be stored at home. NOTE: This sheet is a summary. It may not cover all possible  information. If you have questions about this medicine, talk to your doctor, pharmacist, or health care provider.  2022 Elsevier/Gold Standard (2021-04-02 00:00:00)

## 2021-08-14 NOTE — Assessment & Plan Note (Addendum)
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 On keytruda- OCT 4th, 2022-  The irregular/ill-defined wall thickening seen previously in the right renal pelvis and proximal ureter appears decreased in the interval with less enhancing tissue visible on today's study, but this is a subtle change. Stable 10 mm short axis portal caval lymph node.  STABLE.   # Proceed with  [place] today; Labs today reviewed; Labs today reviewed;  acceptable for treatment today. will repeat scans at next visit- discussed with Dr.Byrnett- re: prep.   # Incidental on CT OCT 10th, 2022-Interval development of a 13 mm lobular polypoid lesion right mainstem- mucus/secretion Vs true lesion- will repeat scans at next visit.    #Descending colon mass- PET scan- FEB 2022-uptake in the cecum;  [history of Lynch syndrome]- JAN 2023- S/p colonoscopy -poor prep. Discussed with Dr.Byrnett- will plan prep with CT scan/next visit. Pt declines further colonoscopies.    # Iron deficiency anemia-hemoglobin 11-12; February 2022 iron studies/ferritin-LOW; hold Venofer with infusions- STABLE.   # Bilateral LE ulcers-s/p wound care evaluation; s/p PTCA with Dr.Dew. bil Arterial Dopp-wnl-STABLE.    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  Understands surgery is on hold because of patient's poor performance status/high risk of surgical complications.  #DISPOSITION: #  Keytruda today # follow up in 3 weeks-;MD labs-cbc/cmp;  Keytruda;- Dr.B.

## 2021-08-14 NOTE — Progress Notes (Signed)
Pt would like to know if he is in remission?

## 2021-08-14 NOTE — Progress Notes (Signed)
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  Is alone/in a wheelchair.  Chad [name] 61 y.o.  male above history of stage IV-ureteral cancer/history of stage III colon cancer right side; recurrent sigmoid colon cancer [un-resected/under surveillance] and multiple other comorbidities currently on Keytruda is here for follow-up.  Patient's had a colonoscopy in the interim which unfortunately was inconclusive given poor prep/stool.    Otherwise patient denies any blood in stool or black-colored stools.  Denies any constipation.  Denies any new symptoms of abdominal pain nausea vomiting.  No blood in urine.  Chronic  shortness of breath.  Review of Systems  Constitutional:  Positive for malaise/fatigue. Negative for chills, diaphoresis, fever and weight loss.  HENT:  Negative for nosebleeds and sore throat.   Eyes:  Negative for double vision.  Respiratory:  Positive for cough and shortness of breath. Negative for hemoptysis and wheezing.   Cardiovascular:  Negative for chest pain, palpitations and orthopnea.  Gastrointestinal:  Positive for constipation. Negative for abdominal pain, blood in stool, diarrhea, heartburn, melena, nausea and vomiting.  Genitourinary:  Negative for dysuria, frequency and urgency.  Musculoskeletal:  Positive for back pain and joint pain.  Skin: Negative.  Negative for itching and rash.  Neurological:  Positive for focal weakness. Negative for dizziness, tingling, weakness and headaches.       Chronic left-sided weakness upper than lower extremity.  Endo/Heme/Allergies:  Does not bruise/bleed easily.  Psychiatric/Behavioral:  Negative for depression. The patient is not nervous/anxious and does not have insomnia.     MEDICAL HISTORY:  Past Medical History:  Diagnosis Date   Anemia    Anxiety    ARF (acute respiratory failure) (HCC)    Bladder cancer (HCC)    COPD (chronic obstructive pulmonary disease) ([place])    Depression    Dysphagia    Family history of colon cancer    Family history of kidney cancer    Family history of leukemia    Family history of prostate cancer    GERD (gastroesophageal reflux disease)    Hepatitis    chronic hep c   Hydronephrosis    Hydronephrosis with ureteral stricture    Hyperlipidemia    Knee pain    Left   Malignant neoplasm of colon (  [place])    Nerve pain    Peripheral vascular disease (HCC)    Prostate cancer (HCC)    Stroke (HCC)    Ureteral cancer, right ([place])    Urinary frequency    Venous hypertension of both lower extremities     SURGICAL HISTORY: Past Surgical History:  Procedure Laterality Date   COLON SURGERY      En bloc extended right hemicolectomy 07/2017   COLONOSCOPY WITH PROPOFOL N/A 11/06/2020   Procedure: COLONOSCOPY WITH PROPOFOL;  Surgeon: Jonathon Bellows, MD;  Location: [facility] ENDOSCOPY;  Service: Gastroenterology;  Laterality: N/A;   COLONOSCOPY WITH PROPOFOL N/A 07/31/2021   Procedure: COLONOSCOPY WITH PROPOFOL;  Surgeon: Ilario Bellow, MD;  Location: [facility] ENDOSCOPY;  Service: Endoscopy;  Laterality: N/A;   CYSTOSCOPY W/ RETROGRADES Right 08/30/2018   Procedure: CYSTOSCOPY WITH RETROGRADE PYELOGRAM;  Surgeon: Hollice Espy, MD;  Location: [facility] ORS;  Service: Urology;  Laterality: Right;   CYSTOSCOPY WITH STENT PLACEMENT Right 04/25/2018   Procedure: CYSTOSCOPY WITH STENT PLACEMENT;  Surgeon: Hollice Espy, MD;  Location: [facility] ORS;  Service: Urology;  Laterality: Right;   CYSTOSCOPY WITH STENT PLACEMENT Right 08/30/2018   Procedure: [place] Exchange;  Surgeon: Hollice Espy, MD;  Location: [facility] ORS;  Service: Urology;  Laterality: Right;   CYSTOSCOPY WITH STENT PLACEMENT Right 03/07/2019   Procedure: CYSTOSCOPY WITH STENT Exchange;  Surgeon: Hollice Espy, MD;  Location: [facility] ORS;  Service: Urology;  Laterality: Right;   CYSTOSCOPY WITH STENT PLACEMENT Right 11/21/2019   Procedure: CYSTOSCOPY WITH STENT Exchange;  Surgeon: Hollice Espy, MD;  Location: [facility] ORS;  Service: Urology;  Laterality: Right;   LOWER EXTREMITY ANGIOGRAPHY Left 05/23/2019   Procedure: LOWER EXTREMITY ANGIOGRAPHY;  Surgeon: Algernon Huxley, MD;  Location: [facility];  Service: Cardiovascular;  Laterality: Left;   LOWER EXTREMITY ANGIOGRAPHY Right 05/30/2019   Procedure: LOWER EXTREMITY ANGIOGRAPHY;  Surgeon: Algernon Huxley, MD;  Location: [facility] [place] [facility];  Service: Cardiovascular;  Laterality: Right;   LOWER EXTREMITY ANGIOGRAPHY Right 02/13/2020   Procedure: LOWER EXTREMITY ANGIOGRAPHY;  Surgeon: Algernon Huxley, MD;  Location: [place];  Service: Cardiovascular;  Laterality: Right;   LOWER  EXTREMITY ANGIOGRAPHY Left 02/20/2020   Procedure: LOWER EXTREMITY ANGIOGRAPHY;  Surgeon: Algernon Huxley, MD;  Location: [facility];  Service: Cardiovascular;  Laterality: Left;   PORTA CATH INSERTION N/A 02/28/2019   Procedure: PORTA CATH INSERTION;  Surgeon: Algernon Huxley, MD;  Location: [facility];  Service: Cardiovascular;  Laterality: N/A;   tumor removed       SOCIAL HISTORY: Social History   Socioeconomic History   Marital status: Single    Spouse name: Not on file   Number of children: Not on file   Years of education: Not on file   Highest education level: Not on file  Occupational History   Not on file  Tobacco Use   Smoking status: Every Day    Packs/day: 1.00    Types: Cigarettes   Smokeless tobacco: Never  Vaping Use   Vaping Use: Never used  Substance and Sexual Activity   Alcohol use: Not Currently   Drug use: Not Currently   Sexual activity: Not Currently  Other Topics Concern   Not on file  Social History Narrative    used to live [place]; moved  To - end of April 2019; in Nursing home; 1pp/day; quit alcohol. 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  Family- dad- prostate ca [at 21y]; brother- 53 died of prostate cancer; brother- 38- no cancers [[place]]; sonGerald Stabs [[place]];[name]-32y prostate ca [facility] [place]]; daughter- 67 [[place]]; another daughter 66 [[place]/addict]. will refer genetics counseling. Given MSI- abnormal; highly suspicious of Lynch syndrome.  Patient's son [name] aware of high possible lynch syndrome.   Social Determinants of Health   Financial Resource Strain: Not on file  Food Insecurity: Not on file  Transportation Needs: Not on file  Physical Activity: Not on file  Stress: Not on file  Social Connections: Not on file  Intimate Partner Violence: Not on file    FAMILY HISTORY: Family History  Problem Relation Age of Onset   Prostate cancer Father 36   Cancer Brother 46       unsure type   Cancer Paternal Uncle         unsure type   Cancer Maternal Grandmother        unsure type   Cancer Paternal Grandmother        unsure type   Kidney cancer Paternal Grandfather    Cancer Other        unsure types   Leukemia Son    Cancer Son        other cancers, possibly colon    ALLERGIES:  is allergic to penicillins.  MEDICATIONS:  Current Outpatient Medications  Medication Sig Dispense Refill   acetaminophen (TYLENOL) 325 MG tablet Take 650 mg by mouth 3 (three) times daily.      aspirin EC 81 MG tablet Take 1 tablet (81 mg total) by mouth daily. 150 tablet 2   carboxymethylcellulose 1 % ophthalmic solution Apply 1 drop to eye 2 (two) times daily.     clopidogrel (PLAVIX) 75 MG tablet Take 1 tablet (75 mg total) by mouth daily. 30 tablet 11   cyclobenzaprine (FLEXERIL) 10 MG tablet Take 10 mg by mouth at bedtime.     escitalopram (LEXAPRO) 5 MG tablet Take 5 mg by mouth daily.     gabapentin (NEURONTIN) 100 MG capsule Take 100 mg by mouth 3 (three) times daily.     mirtazapine (REMERON) 7.5 MG tablet Take 7.5 mg by mouth at bedtime.     oxybutynin (DITROPAN-XL) 5 MG 24 hr tablet Take 5 mg by mouth at bedtime.     Oxycodone HCl 10 MG TABS Take 10 mg by mouth every 6 (six) hours as needed for pain.     pantoprazole (PROTONIX) 40 MG tablet Take 40 mg by mouth daily.     polyethylene glycol powder (GLYCOLAX/MIRALAX) 17 GM/SCOOP powder      senna-docusate (SENOKOT-S) 8.6-50 MG tablet Take 1 tablet by mouth daily.     atorvastatin (LIPITOR) 10 MG tablet Take 1 tablet (10 mg total) by mouth daily. 30 tablet 11   Cholecalciferol (VITAMIN D) 50 MCG (2000 UT) CAPS Take by mouth. (Patient not taking: Reported on 08/14/2021)     Cholecalciferol 50 MCG (2000 UT) CAPS  (Patient not taking: Reported on 08/14/2021)     doxycycline (VIBRA-TABS) 100 MG tablet Take 100 mg by mouth 2 (two) times daily. (Patient not taking: Reported on 08/14/2021)     RESTORE CALCIUM ALGINATE EX Apply topically. Note: Dressing located on left  ankle. Facility cleaning wound "every day shift and applying wound cleanser and appication of calcium Alginate and cover with dry dressing and wrapped with Kerlix" (Patient not taking: Reported on 08/14/2021)     tamsulosin (FLOMAX) 0.4 MG CAPS capsule Take 0.4 mg  by mouth daily. (Patient not taking: Reported on 08/14/2021)     No current facility-administered medications for this visit.   Facility-Administered Medications Ordered in Other Visits  Medication Dose Route Frequency Provider Last Rate Last Admin   heparin lock flush 100 unit/mL  500 Units Intracatheter Once PRN Cammie Sickle, MD       pembrolizumab [facility]) 200 mg in sodium chloride 0.9 % 50 mL chemo infusion  200 mg Intravenous Once Charlaine Dalton R, MD       sodium chloride flush (NS) 0.9 % injection 10 mL  10 mL Intracatheter PRN Cammie Sickle, MD          .  PHYSICAL EXAMINATION: ECOG PERFORMANCE STATUS: 1 - Symptomatic but completely ambulatory  Vitals:   08/14/21 1317  BP: 100/77  Pulse: 85  Temp: 99.2 F (37.3 C)  SpO2: 97%   Filed Weights   08/14/21 1317  Weight: 163 lb (73.9 kg)    Physical Exam HENT:     Head: Normocephalic and atraumatic.     Mouth/Throat:     Pharynx: No oropharyngeal exudate.  Eyes:     Pupils: Pupils are equal, round, and reactive to light.  Cardiovascular:     Rate and Rhythm: Normal rate and regular rhythm.  Pulmonary:     Effort: No respiratory distress.     Breath sounds: No wheezing.  Abdominal:     General: Bowel sounds are normal. There is no distension.     Palpations: Abdomen is soft. There is no mass.     Tenderness: There is no abdominal tenderness. There is no guarding or rebound.  Musculoskeletal:        General: No tenderness. Normal range of motion.     Cervical back: Normal range of motion and neck supple.  Skin:    General: Skin is warm.     Comments: Bilateral lower extremity ulcerations noted.  Pulses intact bilaterally.  Cystlike  lesion noted right elbow-nontender.  No erythema noted.  Neurological:     Mental Status: He is oriented to person, place, and time.     Comments: Sleepy but easily arousable.  Chronic weakness left upper and lower extremity.  Psychiatric:        Mood and Affect: Affect normal.     LABORATORY DATA:  I have reviewed the data as listed Lab Results  Component Value Date   WBC 7.4 08/14/2021   HGB 11.4 (L) 08/14/2021   HCT 34.9 (L) 08/14/2021   MCV 79.5 (L) 08/14/2021   PLT 211 08/14/2021   Recent Labs    07/03/21 0819 07/24/21 0820 08/14/21 1256  NA 138 134* 133*  K 4.2 4.6 4.7  CL 104 103 104  CO2 '24 23 23  ' GLUCOSE 90 97 82  BUN 21 25* 21  CREATININE 1.03 1.09 1.05  CALCIUM 8.9 8.9 8.5*  GFRNONAA >60 >60 >60  PROT 7.5 7.8 7.4  ALBUMIN 3.6 3.8 3.9  AST 34 30 31  ALT '22 17 19  ' ALKPHOS 97 94 101  BILITOT 0.4 0.4 0.2*    RADIOGRAPHIC STUDIES: I have personally reviewed the radiological images as listed and agreed with the findings in the report. No results found.  ASSESSMENT & PLAN:   Ureteral cancer, right ([place]) # High-grade urothelial cancer/cytology; likely of the right renal pelvis /upper ureter. On keytruda- OCT 4th, 2022-  The irregular/ill-defined wall thickening seen previously in the right renal pelvis and proximal ureter appears decreased in the interval with  less enhancing tissue visible on today's study, but this is a subtle change. Stable 10 mm short axis portal caval lymph node.  STABLE.   # Proceed with  [place] today; Labs today reviewed; Labs today reviewed;  acceptable for treatment today. will repeat scans at next visit- discussed with Dr.Byrnett- re: prep.   # Incidental on CT OCT 10th, 2022-Interval development of a 13 mm lobular polypoid lesion right mainstem- mucus/secretion Vs true lesion- will repeat scans at next visit.    #Descending colon mass- PET scan- FEB 2022-uptake in the cecum;  [history of Lynch syndrome]- JAN 2023- S/p colonoscopy  -poor prep. Discussed with Dr.Byrnett- will plan prep with CT scan/next visit. Pt declines further colonoscopies.    # Iron deficiency anemia-hemoglobin 11-12; February 2022 iron studies/ferritin-LOW; hold Venofer with infusions- STABLE.   # Bilateral LE ulcers-s/p wound care evaluation; s/p PTCA with Dr.Dew. bil Arterial Dopp-wnl-STABLE.    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  Understands surgery is on hold because of patient's poor performance status/high risk of surgical complications.  #DISPOSITION: #  Keytruda today # follow up in 3 weeks-;MD labs-cbc/cmp;  Keytruda;- Dr.B.   All questions were answered. The patient knows to call the clinic with any problems, questions or concerns.    Cammie Sickle, MD 08/14/2021 2:00 PM

## 2021-08-14 NOTE — Progress Notes (Signed)
Pt tolerated all infusions well today with no problems or complaints.  Pt left infusion suite stable in a wheelchair.  °

## 2021-08-15 ENCOUNTER — Ambulatory Visit: Payer: Medicaid Other | Admitting: Physician Assistant

## 2021-08-16 ENCOUNTER — Encounter: Payer: Self-pay | Admitting: Nurse Practitioner

## 2021-08-16 ENCOUNTER — Non-Acute Institutional Stay: Payer: Medicaid Other | Admitting: Nurse Practitioner

## 2021-08-16 VITALS — BP 122/67 | HR 86 | Temp 98.0°F | Resp 18 | Wt 156.1 lb

## 2021-08-16 DIAGNOSIS — R630 Anorexia: Secondary | ICD-10-CM

## 2021-08-16 DIAGNOSIS — C661 Malignant neoplasm of right ureter: Secondary | ICD-10-CM

## 2021-08-16 DIAGNOSIS — Z515 Encounter for palliative care: Secondary | ICD-10-CM

## 2021-08-16 DIAGNOSIS — G894 Chronic pain syndrome: Secondary | ICD-10-CM

## 2021-08-16 NOTE — Progress Notes (Signed)
[facility] Consult Note Telephone: [phone]  Fax: [phone]    Date of encounter: 08/16/21 9:16 PM PATIENT NAME: Chad [name] [id number]   [phone] (home)  DOB: June 10, 1961 MRN: [id number] PRIMARY CARE PROVIDER:    [place]:    Contact Information     Name Relation Home Work Mobile   Chad [name], Chad [name]   [phone]      I met face to face with patient in /facility. Palliative Care was asked to follow this patient by consultation request of Chad [name] center to address advance care planning and complex medical decision making. This is a follow up visit.                                  ASSESSMENT AND PLAN / RECOMMENDATIONS:  Symptom Management/Plan: 1. ACP: DNR; treat what is treatable including hospitalizations if necessary   2.Anorexia secondary to protein calorie malnutrition related to cancer improving ongoing. Continue to monitor daily weights, supplements, supportive measures and courage to eat  05/07/2021 weight 162.5 lbs 06/28/2021 weight 160.4 lbs 07/31/2021 weight 156.1 BMI 24.4   3. Chronic pain reviewed, discussed and educated continue to monitor on pain scale, monitor efficacy vs adverse side effects.Will continue to monitor; encouraged to ask when in pain, Continue to follow by psychotherapy and psychiatry for counseling; supportive services. Continue with Oxycodone 47m q6 prn  08/14/2021 Dr BRogue BussingOncology Plan:   [facility] # High-grade urothelial cancer/cytology; likely of the right renal pelvis /upper ureter. On keytruda- OCT 4th, 2022-  The irregular/ill-defined wall thickening seen previously in the right renal pelvis and proximal ureter appears decreased in the interval with less enhancing tissue visible on today's study, but this is a subtle change. Stable 10 mm short axis portal caval lymph node.   STABLE. [place]ceed [place]  # Incidental on CT OCT 10th, 2022-Interval development of a 13 mm lobular polypoid lesion right mainstem- mucus/secretion Vs true lesion- will repeat scans at next visit.    #Descending colon mass- PET scan- FEB 2022-uptake in the cecum;  [history of Lynch syndrome]- JAN 2023- S/p colonoscopy -poor prep. Discussed with Dr.Byrnett- will plan prep with CT scan/next visit. Pt declines further colonoscopies.   # Iron deficiency anemia-hemoglobin 11-12; February 2022 iron studies/ferritin-LOW; hold Venofer with infusions- STABLE.    # Bilateral LE ulcers-s/p wound care evaluation; s/p PTCA with Dr.Dew. bil Arterial Dopp-wnl-STABLE.     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  Understands surgery is on hold because of patient's poor performance status/high risk of surgical complications.  4. Palliative care encounter; Palliative medicine team will continue to support patient, patient's family, and medical team. Visit consisted of counseling and education dealing with the complex and emotionally intense issues of symptom management and palliative care in the setting of serious and potentially life-threatening illness  Follow up Palliative Care Visit: Palliative care will continue to follow for complex medical decision making, advance care planning, and clarification of goals. Return 8 weeks or prn.  I spent 67  minutes providing this consultation. More than 50% of the time in this consultation was spent in counseling and care coordination. 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 year old male  with multiple medical problems including   late onset CVA, urothelial cancer, prostate cancer, chronic hepatitis C, hyperlipidemia, colon surgery, protein calorie malnutrition, anxiety, depression. Chad [name] continues to reside at [facility] [facility] at [facility] care center. Chad [name] transfers himself to w/c, requires assistance for ADL's. Chad [name] feeds himself with fair to declined appetite. [name] continues to be followed by Psychiatry at the facility. Chad [name] continues to smoke daily. Continues chronic peripheral insufficient,  non-pressure chronic left ankle, non-pressure chronic right heel and mid foot, non pressure chronic ulcer of right foot followed by wound. 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 No recent falls, infections, hospitalization. At present Chad [name] is sitting in the w/c in his room. We talked about purpose of pc visit. We talked about how [name] has been feeling. [name]  endorses he as been improving. We talked about ros, symptoms including pain, his wounds. Recent Oncology visit, colonscopy, smoking cessation which Chad [name] declined. We talked about appetite, nutrition, sleep patterns/hygiene. We talked about residing at facility. We talked about medical goals, continue with Chemotherapy. We talked about quality of life. We talked about role PC in poc. Therapeutic listening, emotional support provided. Questions answered. I updated staff.   History obtained from review of EMR, discussion with facility staff and Chad [name].  I reviewed available labs, medications, imaging, studies and related documents from the EMR.  Records reviewed and summarized above.   ROS 10 point system reviewed with facility and Chad [name] all negative except HPI  Physical Exam: Constitutional: NAD General: w/c dependent pleasant male EYES: lids intact ENMT: oral mucous membranes moist CV: S1S2, RRR, +BLE edema Pulmonary: LCTA, no increased work of breathing, no cough, room air Abdomen: normo-active BS + 4 quadrants, soft and non tender MSK: w/c dependent Skin: warm and dry Neuro:  no generalized weakness,  no cognitive impairment Psych: non-anxious affect, A and O x 3 Thank you for the opportunity to participate in the care of Mr. Hessling.  The palliative care team will continue to follow. Please call our office at [phone] if we can be of additional assistance.   Questions and concerns were addressed.  Provided general support and encouragement,  no other unmet needs identified   This chart was dictated using voice recognition software.  Despite best efforts to proofread,  errors can occur which can change the documentation meaning.   Chad Rubenstein Ihor Gully, NP

## 2021-08-19 ENCOUNTER — Other Ambulatory Visit: Payer: Self-pay

## 2021-08-22 ENCOUNTER — Ambulatory Visit: Payer: Medicaid Other | Admitting: Physician Assistant

## 2021-08-23 ENCOUNTER — Encounter: Payer: Self-pay | Admitting: Nurse Practitioner

## 2021-08-23 ENCOUNTER — Non-Acute Institutional Stay: Payer: Medicaid Other | Admitting: Nurse Practitioner

## 2021-08-23 VITALS — BP 118/62 | HR 80 | Temp 97.1°F | Resp 18 | Wt 156.1 lb

## 2021-08-23 DIAGNOSIS — C661 Malignant neoplasm of right ureter: Secondary | ICD-10-CM

## 2021-08-23 DIAGNOSIS — Z515 Encounter for palliative care: Secondary | ICD-10-CM

## 2021-08-23 NOTE — Progress Notes (Addendum)
[facility] Consult Note Telephone: [phone]  Fax: [phone]    Date of encounter: 08/23/21 6:30 PM PATIENT NAME: Chad [name] [id number]   [phone] (home)  DOB: Dec 12, 1960 MRN: [id number] PRIMARY CARE PROVIDER:    [place]:    Contact Information     Name Relation Home Work Mobile   Chad [name], Chad [name]   [phone]      I met face to face with patient in facility. Palliative Care was asked to follow this patient by consultation request of  [place] to address advance care planning and complex medical decision making. This is a follow up visit.                                 ASSESSMENT AND PLAN / RECOMMENDATIONS:  Symptom Management/Plan: 1. ACP: DNR; treat what is treatable including hospitalizations if necessary   2.Anorexia secondary to protein calorie malnutrition related to cancer improving ongoing. Current weight 160.4 lbs. Continue to monitor daily weights, supplements, supportive measures and courage to eat   3. Chronic pain reviewed, discussed and educated continue to monitor on pain scale, monitor efficacy vs adverse side effects.Will continue to monitor; encouraged to ask when in pain, Continue to follow by psychotherapy and psychiatry for counseling; supportive services. Continue with Oxycodone 64m q6 prn   4. Palliative care encounter; Palliative medicine team will continue to support patient, patient's family, and medical team. Visit consisted of counseling and education dealing with the complex and emotionally intense issues of symptom management and palliative care in the setting of serious and potentially life-threatening illness  Follow up Palliative Care Visit: Palliative care will continue to follow for complex medical decision making, advance care planning, and clarification of goals. Return 8 weeks or  prn.  I spent 68 minutes providing this consultation. More than 50% of the time in this consultation was spent in counseling and care coordination.  PPS: 50%  Chief Complaint: Follow up palliative consult for complex medical decision making  HISTORY OF PRESENT ILLNESS:  RRohan Juengeris a 61y.o. year old male  with multiple medical problems including   late onset CVA, urothelial cancer, prostate cancer, chronic hepatitis C, hyperlipidemia, colon surgery, protein calorie malnutrition, anxiety, depression. [place]. L[place] [facility] [place]. Mr. LWilmertransfers himself to w/c, requires assistance for ADL's. Mr. LLambertfeeds himself with fair to declined appetite. No recent falls, wounds, hospitalizations. Mr. LBeaversrequested to see PC to re-visit code status. I visited and observed Mr. LShafer We talked about how he has been feeling. We talked about symptoms, ros, medical goals. We re-visited code status, talked about difference between DNR which is what he chose for now and what a full code is. We talked about pc visit when [facility] with discussion. We talked about  scenarios. Mr. LGintzendorses he wishes to remain a DNR currently. Updated staff. No changes.   History obtained from review of EMR, discussion with facility staff and  Mr. LCrass  I reviewed available labs, medications, imaging, studies and related documents from the EMR.  Records reviewed and summarized above.   ROS 10 point system reviewed with staff and Mr LEsperall negative except HPI  Physical Exam: Constitutional: NAD General: frail appearing, chronically ill pleasant male EYES:  lids intact ENMT: oral mucous membranes moist CV: S1S2, RRR Pulmonary: LCTA, no increased work of breathing, no cough, room air Abdomen: normo-active BS + 4 quadrants, soft and non tender MSK: q/c dependent Skin: warm and dry Neuro:  +  generalized weakness,  no cognitive impairment Psych: non-anxious affect, A and O x 3  Thank you for the opportunity to participate in the care of Mr. Umholtz.  The palliative care team will continue to follow. Please call our office at [phone] if we can be of additional assistance.   This chart was dictated using voice recognition software.  Despite best efforts to proofread,  errors can occur which can change the documentation meaning.   Questions and concerns were addressed.  Provided general support and encouragement, no other unmet needs identified   Ilyanna Baillargeon Ihor Gully, NP

## 2021-08-26 ENCOUNTER — Other Ambulatory Visit: Payer: Self-pay

## 2021-09-04 ENCOUNTER — Inpatient Hospital Stay: Payer: Medicaid Other | Attending: Internal Medicine

## 2021-09-04 ENCOUNTER — Encounter: Payer: Self-pay | Admitting: Internal Medicine

## 2021-09-04 ENCOUNTER — Other Ambulatory Visit: Payer: Self-pay

## 2021-09-04 ENCOUNTER — Inpatient Hospital Stay (HOSPITAL_BASED_OUTPATIENT_CLINIC_OR_DEPARTMENT_OTHER): Payer: Medicaid Other | Admitting: Internal Medicine

## 2021-09-04 ENCOUNTER — Inpatient Hospital Stay: Payer: Medicaid Other

## 2021-09-04 VITALS — Resp 18

## 2021-09-04 VITALS — BP 106/75 | HR 76 | Temp 97.7°F

## 2021-09-04 DIAGNOSIS — C661 Malignant neoplasm of right ureter: Secondary | ICD-10-CM | POA: Insufficient documentation

## 2021-09-04 DIAGNOSIS — D509 Iron deficiency anemia, unspecified: Secondary | ICD-10-CM | POA: Diagnosis not present

## 2021-09-04 DIAGNOSIS — C182 Malignant neoplasm of ascending colon: Secondary | ICD-10-CM

## 2021-09-04 DIAGNOSIS — Z1509 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: Secondary | ICD-10-CM

## 2021-09-04 DIAGNOSIS — C189 Malignant neoplasm of colon, unspecified: Secondary | ICD-10-CM | POA: Diagnosis not present

## 2021-09-04 DIAGNOSIS — Z515 Encounter for palliative care: Secondary | ICD-10-CM

## 2021-09-04 DIAGNOSIS — Z7189 Other specified counseling: Secondary | ICD-10-CM

## 2021-09-04 DIAGNOSIS — C187 Malignant neoplasm of sigmoid colon: Secondary | ICD-10-CM

## 2021-09-04 DIAGNOSIS — Z5112 Encounter for antineoplastic immunotherapy: Secondary | ICD-10-CM | POA: Diagnosis not present

## 2021-09-04 LAB — CBC WITH DIFFERENTIAL/PLATELET
Abs Immature Granulocytes: 0.01 10*3/uL (ref 0.00–0.07)
Basophils Absolute: 0.1 10*3/uL (ref 0.0–0.1)
Basophils Relative: 1 %
Eosinophils Absolute: 0.3 10*3/uL (ref 0.0–0.5)
Eosinophils Relative: 5 %
HCT: 35 % — ABNORMAL LOW (ref 39.0–52.0)
Hemoglobin: 11 g/dL — ABNORMAL LOW (ref 13.0–17.0)
Immature Granulocytes: 0 %
Lymphocytes Relative: 20 %
Lymphs Abs: 1.3 10*3/uL (ref 0.7–4.0)
MCH: 25 pg — ABNORMAL LOW (ref 26.0–34.0)
MCHC: 31.4 g/dL (ref 30.0–36.0)
MCV: 79.5 fL — ABNORMAL LOW (ref 80.0–100.0)
Monocytes Absolute: 0.6 10*3/uL (ref 0.1–1.0)
Monocytes Relative: 10 %
Neutro Abs: 4.1 10*3/uL (ref 1.7–7.7)
Neutrophils Relative %: 64 %
Platelets: 199 10*3/uL (ref 150–400)
RBC: 4.4 MIL/uL (ref 4.22–5.81)
RDW: 14.6 % (ref 11.5–15.5)
WBC: 6.4 10*3/uL (ref 4.0–10.5)
nRBC: 0 % (ref 0.0–0.2)

## 2021-09-04 LAB — COMPREHENSIVE METABOLIC PANEL
ALT: 20 U/L (ref 0–44)
AST: 33 U/L (ref 15–41)
Albumin: 3.4 g/dL — ABNORMAL LOW (ref 3.5–5.0)
Alkaline Phosphatase: 104 U/L (ref 38–126)
Anion gap: 8 (ref 5–15)
BUN: 17 mg/dL (ref 8–23)
CO2: 26 mmol/L (ref 22–32)
Calcium: 8.6 mg/dL — ABNORMAL LOW (ref 8.9–10.3)
Chloride: 104 mmol/L (ref 98–111)
Creatinine, Ser: 0.89 mg/dL (ref 0.61–1.24)
GFR, Estimated: >60 mL/min (ref >60)
Glucose, Bld: 93 mg/dL (ref 70–99)
Potassium: 3.8 mmol/L (ref 3.5–5.1)
Sodium: 138 mmol/L (ref 135–145)
Total Bilirubin: 0.4 mg/dL (ref 0.3–1.2)
Total Protein: 7.4 g/dL (ref 6.5–8.1)

## 2021-09-04 LAB — IRON AND TIBC
Iron: 24 ug/dL — ABNORMAL LOW (ref 45–182)
Saturation Ratios: 7 % — ABNORMAL LOW (ref 17.9–39.5)
TIBC: 361 ug/dL (ref 250–450)
UIBC: 337 ug/dL

## 2021-09-04 LAB — FERRITIN: Ferritin: 18 ng/mL — ABNORMAL LOW (ref 24–336)

## 2021-09-04 MED ORDER — SODIUM CHLORIDE 0.9% FLUSH
10.0000 mL | INTRAVENOUS | Status: DC | PRN
  Administered 2021-09-04: 10 mL
  Filled 2021-09-04: qty 10

## 2021-09-04 MED ORDER — SODIUM CHLORIDE 0.9 % IV SOLN: 200.0000 mg | Freq: Once | INTRAVENOUS | Status: DC

## 2021-09-04 MED ORDER — SODIUM CHLORIDE 0.9 % IV SOLN
200.0000 mg | Freq: Once | INTRAVENOUS | Status: AC
  Administered 2021-09-04: 200 mg via INTRAVENOUS
  Filled 2021-09-04: qty 200

## 2021-09-04 MED ORDER — HEPARIN SOD (PORK) LOCK FLUSH 100 UNIT/ML IV SOLN
500.0000 [IU] | Freq: Once | INTRAVENOUS | Status: AC | PRN
  Filled 2021-09-04: qty 5

## 2021-09-04 MED ORDER — SODIUM CHLORIDE 0.9 % IV SOLN
Freq: Once | INTRAVENOUS | Status: AC
  Filled 2021-09-04: qty 250

## 2021-09-04 MED ORDER — HEPARIN SOD (PORK) LOCK FLUSH 100 UNIT/ML IV SOLN
INTRAVENOUS | Status: AC
  Administered 2021-09-04: 500 [IU]
  Filled 2021-09-04: qty 5

## 2021-09-04 NOTE — Patient Instructions (Signed)
[facility]   ?Discharge Instructions: ?Thank you for choosing [facility] to provide your oncology and hematology care.  ?If you have a lab appointment with the Cancer Center, please go directly to the Cancer Center and check in at the registration area. ?  ?Wear comfortable clothing and clothing appropriate for easy access to any Portacath or PICC line.  ? ?We strive to give you quality time with your provider. You may need to reschedule your appointment if you arrive late (15 or more minutes).  Arriving late affects you and other patients whose appointments are after yours.  Also, if you miss three or more appointments without notifying the office, you may be dismissed from the clinic at the provider?s discretion.    ?  ?For prescription refill requests, have your pharmacy contact our office and allow 72 hours for refills to be completed.   ? ?Today you received the following chemotherapy and/or immunotherapy agents: Keytruda.    ?  ?To help prevent nausea and vomiting after your treatment, we encourage you to take your nausea medication as directed. ? ?BELOW ARE SYMPTOMS THAT SHOULD BE REPORTED IMMEDIATELY: ?*FEVER GREATER THAN 100.4 F (38 ?C) OR HIGHER ?*CHILLS OR SWEATING ?*NAUSEA AND VOMITING THAT IS NOT CONTROLLED WITH YOUR NAUSEA MEDICATION ?*UNUSUAL SHORTNESS OF BREATH ?*UNUSUAL BRUISING OR BLEEDING ?*URINARY PROBLEMS (pain or burning when urinating, or frequent urination) ?*BOWEL PROBLEMS (unusual diarrhea, constipation, pain near the anus) ?TENDERNESS IN MOUTH AND THROAT WITH OR WITHOUT PRESENCE OF ULCERS (sore throat, sores in mouth, or a toothache) ?UNUSUAL RASH, SWELLING OR PAIN  ?UNUSUAL VAGINAL DISCHARGE OR ITCHING  ? ?Items with * indicate a potential emergency and should be followed up as soon as possible or go to the Emergency Department if any problems should occur. ? ?Please show the CHEMOTHERAPY ALERT CARD or IMMUNOTHERAPY ALERT CARD at check-in  to the Emergency Department and triage nurse. ? ?Should you have questions after your visit or need to cancel or reschedule your appointment, please contact [facility]  Dept: [phone]  and follow the prompts.  Office hours are 8:00 a.m. to 4:30 p.m. Monday - Friday. Please note that voicemails left after 4:00 p.m. may not be returned until the following business day.  We are closed weekends and major holidays. You have access to a nurse at all times for urgent questions. Please call the main number to the clinic Dept: [phone] and follow the prompts. ? ?For any non-urgent questions, you may also contact your provider using MyChart. We now offer e-Visits for anyone 18 and older to request care online for non-urgent symptoms. For details visit [email or web address]. ?  ?Also download the MyChart app! Go to the app store, search "MyChart", open the app, select [facility], and log in with your MyChart username and password. ? ?Due to Covid, a mask is required upon entering the hospital/clinic. If you do not have a mask, one will be given to you upon arrival. For doctor visits, patients may have 1 support person aged 18 or older with them. For treatment visits, patients cannot have anyone with them due to current Covid guidelines and our immunocompromised population.  ?

## 2021-09-04 NOTE — Progress Notes (Signed)
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  Is alone/in a wheelchair.  Chad [name] 61 y.o.  male above history of stage IV-ureteral cancer/history of stage III colon cancer right side; recurrent sigmoid colon cancer [un-resected/under surveillance] and multiple other comorbidities currently on Keytruda is here for follow-up.  Denies any worsening abdominal pain.  Any nausea vomiting or constipation.  No blood in stools or black-colored stools.  No hematuria.  Chronic shortness of breath.  Review of Systems  Constitutional:  Positive for malaise/fatigue. Negative for chills, diaphoresis, fever and weight loss.   HENT:  Negative for nosebleeds and sore throat.   Eyes:  Negative for double vision.  Respiratory:  Positive for cough and shortness of breath. Negative for hemoptysis and wheezing.   Cardiovascular:  Negative for chest pain, palpitations and orthopnea.  Gastrointestinal:  Positive for constipation. Negative for abdominal pain, blood in stool, diarrhea, heartburn, melena, nausea and vomiting.  Genitourinary:  Negative for dysuria, frequency and urgency.  Musculoskeletal:  Positive for back pain and joint pain.  Skin: Negative.  Negative for itching and rash.  Neurological:  Positive for focal weakness. Negative for dizziness, tingling, weakness and headaches.       Chronic left-sided weakness upper than lower extremity.  Endo/Heme/Allergies:  Does not bruise/bleed easily.  Psychiatric/Behavioral:  Negative for depression. The patient is not nervous/anxious and does not have insomnia.     MEDICAL HISTORY:  Past Medical History:  Diagnosis Date   Anemia    Anxiety    ARF (acute respiratory failure) (HCC)    Bladder cancer (HCC)    COPD (chronic obstructive pulmonary disease) ([place])    Depression    Dysphagia    Family history of colon cancer    Family history of kidney cancer    Family history of leukemia    Family history of prostate cancer    GERD (gastroesophageal reflux disease)    Hepatitis    chronic hep c   Hydronephrosis    Hydronephrosis with ureteral stricture    Hyperlipidemia    Knee pain    Left   Malignant neoplasm of colon ([place])    Nerve pain    Peripheral vascular disease ([place])    Prostate cancer ([place])    Stroke ([place])  Ureteral cancer, right ([place])    Urinary frequency    Venous hypertension of both lower extremities     SURGICAL HISTORY: Past Surgical History:  Procedure Laterality Date   COLON SURGERY     En bloc extended right hemicolectomy 07/2017   COLONOSCOPY WITH PROPOFOL N/A 11/06/2020   Procedure: COLONOSCOPY WITH PROPOFOL;  Surgeon: Jonathon Bellows, MD;  Location: [facility] END[facility]COPY;  Service: Gastroenterology;  Laterality: N/A;   COLONOSCOPY WITH PROPOFOL N/A 07/31/2021   Procedure: COLONOSCOPY WITH PROPOFOL;  Surgeon: Nevaan Bellow, MD;  Location: [facility] ENDOSCOPY;  Service: Endoscopy;  Laterality: N/A;   CYSTOSCOPY W/ RETROGRADES Right 08/30/2018   Procedure: CYSTOSCOPY WITH RETROGRADE PYELOGRAM;  Surgeon: Hollice Espy, MD;  Location: [facility] ORS;  Service: Urology;  Laterality: Right;   CYSTOSCOPY WITH STENT PLACEMENT Right 04/25/2018   Procedure: CYSTOSCOPY WITH STENT PLACEMENT;  Surgeon: Hollice Espy, MD;  Location: [facility] ORS;  Service: Urology;  Laterality: Right;   CYSTOSCOPY WITH STENT PLACEMENT Right 08/30/2018   Procedure: [place];  Surgeon: Hollice Espy, MD;  Location: [facility] ORS;  Service: Urology;  Laterality: Right;   CYSTOSCOPY WITH STENT PLACEMENT Right 03/07/2019   Procedure: CYSTOSCOPY WITH STENT [place];  Surgeon: Hollice Espy, MD;  Location: [facility] ORS;  Service: Urology;  Laterality: Right;   CYSTOSCOPY WITH STENT PLACEMENT Right 11/21/2019   Procedure: CYSTOSCOPY WITH STENT [place];  Surgeon: Hollice Espy, MD;  Location: [facility] ORS;  Service: Urology;  Laterality: Right;   LOWER EXTREMITY ANGIOGRAPHY Left 05/23/2019   Procedure: LOWER EXTREMITY ANGIOGRAPHY;  Surgeon: Algernon Huxley, MD;  Location: [facility];  Service: Cardiovascular;  Laterality: Left;   LOWER EXTREMITY ANGIOGRAPHY Right 05/30/2019   Procedure: LOWER EXTREMITY ANGIOGRAPHY;  Surgeon: Algernon Huxley, MD;  Location: [facility];  Service: Cardiovascular;  Laterality: Right;   LOWER EXTREMITY ANGIOGRAPHY Right 02/13/2020   Procedure: LOWER EXTREMITY ANGIOGRAPHY;  Surgeon: Algernon Huxley, MD;  Location: [facility];  Service: Cardiovascular;  Laterality: Right;   LOWER EXTREMITY ANGIOGRAPHY Left 02/20/2020   Procedure: LOWER EXTREMITY ANGIOGRAPHY;  Surgeon: Algernon Huxley, MD;  Location: [facility];   Service: Cardiovascular;  Laterality: Left;   PORTA CATH INSERTION N/A 02/28/2019   Procedure: PORTA CATH INSERTION;  Surgeon: Algernon Huxley, MD;  Location: [facility];  Service: Cardiovascular;  Laterality: N/A;   tumor removed       SOCIAL HISTORY: Social History   Socioeconomic History   Marital status: Single    Spouse name: Not on file   Number of children: Not on file   Years of education: Not on file   Highest education level: Not on file  Occupational History   Not on file  Tobacco Use   Smoking status: Every Day    Packs/day: 1.00    Types: Cigarettes   Smokeless tobacco: Never  Vaping Use   Vaping Use: Never used  Substance and Sexual Activity   Alcohol use: Not Currently   Drug use: Not Currently   Sexual activity: Not Currently  Other Topics Concern   Not on file  Social History Narrative    used to live [place]; moved  To [place]- end of April 2019; in Nursing home; 1pp/day; quit alcohol. 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        Family- dad- prostate ca [at 76y]; brother- 24 died of prostate cancer; brother- 68- no cancers [[place]]; sonGerald Stabs [[place]];[id number] prostate ca [facility] [place]];  daughter- 20 [NM]; another daughter 36 [NM/addict]. will refer genetics counseling. Given MSI- abnormal; highly suspicious of Lynch syndrome.  Patient's son [name] aware of high possible lynch syndrome.   Social Determinants of Health   Financial Resource Strain: Not on file  Food Insecurity: Not on file  Transportation Needs: Not on file  Physical Activity: Not on file  Stress: Not on file  Social Connections: Not on file  Intimate Partner Violence: Not on file    FAMILY HISTORY: Family History  Problem Relation Age of Onset   Prostate cancer Father 67   Cancer Brother 54       unsure type   Cancer Paternal Uncle        unsure type   Cancer Maternal Grandmother        unsure type   Cancer Paternal Grandmother        unsure type   Kidney cancer Paternal  Grandfather    Cancer Other        unsure types   Leukemia Son    Cancer Son        other cancers, possibly colon    ALLERGIES:  is allergic to penicillins.  MEDICATIONS:  Current Outpatient Medications  Medication Sig Dispense Refill   acetaminophen (TYLENOL) 325 MG tablet Take 650 mg by mouth 3 (three) times daily.      aspirin EC 81 MG tablet Take 1 tablet (81 mg total) by mouth daily. 150 tablet 2   carboxymethylcellulose 1 % ophthalmic solution Apply 1 drop to eye 2 (two) times daily.     clopidogrel (PLAVIX) 75 MG tablet Take 1 tablet (75 mg total) by mouth daily. 30 tablet 11   cyclobenzaprine (FLEXERIL) 10 MG tablet Take 10 mg by mouth at bedtime.     escitalopram (LEXAPRO) 5 MG tablet Take 5 mg by mouth daily.     gabapentin (NEURONTIN) 100 MG capsule Take 100 mg by mouth 3 (three) times daily.     mirtazapine (REMERON) 7.5 MG tablet Take 7.5 mg by mouth at bedtime.     oxybutynin (DITROPAN-XL) 5 MG 24 hr tablet Take 5 mg by mouth at bedtime.     Oxycodone HCl 10 MG TABS Take 10 mg by mouth every 6 (six) hours as needed for pain.     pantoprazole (PROTONIX) 40 MG tablet Take 40 mg by mouth daily.     polyethylene glycol powder (GLYCOLAX/MIRALAX) 17 GM/SCOOP powder      senna-docusate (SENOKOT-S) 8.6-50 MG tablet Take 1 tablet by mouth daily.     atorvastatin (LIPITOR) 10 MG tablet Take 1 tablet (10 mg total) by mouth daily. 30 tablet 11   Cholecalciferol (VITAMIN D) 50 MCG (2000 UT) CAPS Take by mouth. (Patient not taking: Reported on 08/14/2021)     Cholecalciferol 50 MCG (2000 UT) CAPS  (Patient not taking: Reported on 08/14/2021)     doxycycline (VIBRA-TABS) 100 MG tablet Take 100 mg by mouth 2 (two) times daily. (Patient not taking: Reported on 08/14/2021)     RESTORE CALCIUM ALGINATE EX Apply topically. Note: Dressing located on left ankle. Facility cleaning wound "every day shift and applying wound cleanser and appication of calcium Alginate and cover with dry dressing and  wrapped with Kerlix" (Patient not taking: Reported on 08/14/2021)     tamsulosin (FLOMAX) 0.4 MG CAPS capsule Take 0.4 mg by mouth daily. (Patient not taking: Reported on 08/14/2021)     No current facility-administered medications for this visit.      [facility] Kitchen  PHYSICAL EXAMINATION: ECOG PERFORMANCE STATUS: 1 - Symptomatic but completely ambulatory  Vitals:   09/04/21 1400  BP: 106/75  Pulse: 76  Temp: 97.7 F (36.5 C)   There were no vitals filed for this visit.   Physical Exam HENT:     Head: Normocephalic and atraumatic.     Mouth/Throat:     Pharynx: No oropharyngeal exudate.  Eyes:     Pupils: Pupils are equal, round, and reactive to light.  Cardiovascular:     Rate and Rhythm: Normal rate and regular rhythm.  Pulmonary:     Effort: No respiratory distress.     Breath sounds: No wheezing.  Abdominal:     General: Bowel sounds are normal. There is no distension.     Palpations: Abdomen is soft. There is no mass.     Tenderness: There is no abdominal tenderness. There is no guarding or rebound.  Musculoskeletal:        General: No tenderness. Normal range of motion.     Cervical back: Normal range of motion and neck supple.  Skin:    General: Skin is warm.     Comments: Bilateral lower extremity ulcerations noted.  Pulses intact bilaterally.  Cystlike lesion noted right elbow-nontender.  No erythema noted.  Neurological:     Mental Status: He is oriented to person, place, and time.     Comments: Sleepy but easily arousable.  Chronic weakness left upper and lower extremity.  Psychiatric:        Mood and Affect: Affect normal.     LABORATORY DATA:  I have reviewed the data as listed Lab Results  Component Value Date   WBC 6.4 09/04/2021   HGB 11.0 (L) 09/04/2021   HCT 35.0 (L) 09/04/2021   MCV 79.5 (L) 09/04/2021   PLT 199 09/04/2021   Recent Labs    07/24/21 0820 08/14/21 1256 09/04/21 1337  NA 134* 133* 138  K 4.6 4.7 3.8  CL 103 104 104  CO2 '23 23  26  ' GLUCOSE 97 82 93  BUN 25* 21 17  CREATININE 1.09 1.05 0.89  CALCIUM 8.9 8.5* 8.6*  GFRNONAA >60 >60 >60  PROT 7.8 7.4 7.4  ALBUMIN 3.8 3.9 3.4*  AST 30 31 33  ALT '17 19 20  ' ALKPHOS 94 101 104  BILITOT 0.4 0.2* 0.4    RADIOGRAPHIC STUDIES: I have personally reviewed the radiological images as listed and agreed with the findings in the report. No results found.  ASSESSMENT & PLAN:   Ureteral cancer, right ([place]) # High-grade urothelial cancer/cytology; likely of the right renal pelvis /upper ureter. On keytruda- OCT 4th, 2022-  The irregular/ill-defined wall thickening seen previously in the right renal pelvis and proximal ureter appears decreased in the interval with less enhancing tissue visible on today's study, but this is a subtle change. Stable 10 mm short axis portal caval lymph node.  STABLE.   # Proceed with  [place] today; Labs today reviewed; Labs today reviewed;  acceptable for treatment today. will repeat scans today/ordered today.  # Incidental on CT OCT 10th, 2022-Interval development of a 13 mm lobular polypoid lesion right mainstem- mucus/secretion Vs true lesion-await repeat imaging  #Descending colon mass- PET scan- FEB 2022-uptake in the cecum;  [history of Lynch syndrome]- JAN 2023- S/p colonoscopy -poor prep. - Discussed with Dr.Byrnett- re: prep [Dulcolax 10 mg twice dailyx for 1 day; start day prior to CT imaging; also noted "long soak"-the CT instructions].  Pt declines further colonoscopies.    #  Iron deficiency anemia-hemoglobin 11-12; February 2022 iron studies/ferritin-LOW; hold Venofer with infusions- STABLE.add iron studies/ferritin today.    # Bilateral LE ulcers-s/p wound care evaluation; s/p PTCA with Dr.Dew. bil Arterial Dopp-wnl-STABLE.    Order TSH at next visit;  #DISPOSITION: #  Keytruda today # follow up in 3 weeks-;MD labs-cbc/cmp;  Keytruda;possible venfoer; CT CAP prior-- Dr.B.   All questions were answered. The patient knows to call  the clinic with any problems, questions or concerns.    Cammie Sickle, MD 09/04/2021 2:32 PM

## 2021-09-04 NOTE — Assessment & Plan Note (Signed)
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 On keytruda- OCT 4th, 2022-  The irregular/ill-defined wall thickening seen previously in the right renal pelvis and proximal ureter appears decreased in the interval with less enhancing tissue visible on today's study, but this is a subtle change. Stable 10 mm short axis portal caval lymph node.  STABLE.   # Proceed with  [place] today; Labs today reviewed; Labs today reviewed;  acceptable for treatment today. will repeat scans today/ordered today.  # Incidental on CT OCT 10th, 2022-Interval development of a 13 mm lobular polypoid lesion right mainstem- mucus/secretion Vs true lesion-await repeat imaging  #Descending colon mass- PET scan- FEB 2022-uptake in the cecum;  [history of Lynch syndrome]- JAN 2023- S/p colonoscopy -poor prep. - Discussed with Dr.Byrnett- re: prep [Dulcolax 10 mg twice dailyx for 1 day; start day prior to CT imaging; also noted "long soak"-the CT instructions].  Pt declines further colonoscopies.    # Iron deficiency anemia-hemoglobin 11-12; February 2022 iron studies/ferritin-LOW; hold Venofer with infusions- STABLE.add iron studies/ferritin today.    # Bilateral LE ulcers-s/p wound care evaluation; s/p PTCA with Dr.Dew. bil Arterial Dopp-wnl-STABLE.    Order TSH at next visit;  #DISPOSITION: #  Keytruda today # follow up in 3 weeks-;MD labs-cbc/cmp;  Keytruda;possible venfoer; CT CAP prior-- Dr.B.

## 2021-09-04 NOTE — Addendum Note (Signed)
Addended by: Vanice Sarah on: 09/04/2021 02:36 PM   Modules accepted: Orders

## 2021-09-04 NOTE — Progress Notes (Signed)
Patient denies new problems/concerns today.    Patient is a resident at [facility] and brought in paperwork from facility but the medication administration list was no included.  No medication changes that the patient is aware.

## 2021-09-18 ENCOUNTER — Other Ambulatory Visit: Payer: Self-pay | Admitting: *Deleted

## 2021-09-18 DIAGNOSIS — C187 Malignant neoplasm of sigmoid colon: Secondary | ICD-10-CM

## 2021-09-18 DIAGNOSIS — C661 Malignant neoplasm of right ureter: Secondary | ICD-10-CM

## 2021-09-24 ENCOUNTER — Ambulatory Visit
Admission: RE | Admit: 2021-09-24 | Discharge: 2021-09-24 | Disposition: A | Discharge status: Home / Self Care | Payer: Medicaid Other | Source: Ambulatory Visit | Attending: Internal Medicine | Admitting: Internal Medicine

## 2021-09-24 ENCOUNTER — Other Ambulatory Visit: Payer: Self-pay

## 2021-09-24 DIAGNOSIS — C661 Malignant neoplasm of right ureter: Secondary | ICD-10-CM | POA: Insufficient documentation

## 2021-09-24 DIAGNOSIS — C187 Malignant neoplasm of sigmoid colon: Secondary | ICD-10-CM | POA: Insufficient documentation

## 2021-09-24 MED ORDER — IOHEXOL 300 MG/ML  SOLN
100.0000 mL | Freq: Once | INTRAMUSCULAR | Status: AC | PRN
  Administered 2021-09-24: 100 mL via INTRAVENOUS

## 2021-09-26 ENCOUNTER — Inpatient Hospital Stay (HOSPITAL_BASED_OUTPATIENT_CLINIC_OR_DEPARTMENT_OTHER): Payer: Medicaid Other | Admitting: Internal Medicine

## 2021-09-26 ENCOUNTER — Other Ambulatory Visit: Payer: Self-pay

## 2021-09-26 ENCOUNTER — Encounter: Payer: Self-pay | Admitting: Internal Medicine

## 2021-09-26 ENCOUNTER — Inpatient Hospital Stay: Payer: Medicaid Other | Attending: Internal Medicine

## 2021-09-26 ENCOUNTER — Inpatient Hospital Stay: Payer: Medicaid Other

## 2021-09-26 DIAGNOSIS — Z7189 Other specified counseling: Secondary | ICD-10-CM

## 2021-09-26 DIAGNOSIS — Z515 Encounter for palliative care: Secondary | ICD-10-CM

## 2021-09-26 DIAGNOSIS — Z5112 Encounter for antineoplastic immunotherapy: Secondary | ICD-10-CM | POA: Diagnosis not present

## 2021-09-26 DIAGNOSIS — C187 Malignant neoplasm of sigmoid colon: Secondary | ICD-10-CM | POA: Diagnosis not present

## 2021-09-26 DIAGNOSIS — D509 Iron deficiency anemia, unspecified: Secondary | ICD-10-CM | POA: Insufficient documentation

## 2021-09-26 DIAGNOSIS — D500 Iron deficiency anemia secondary to blood loss (chronic): Secondary | ICD-10-CM

## 2021-09-26 DIAGNOSIS — C661 Malignant neoplasm of right ureter: Secondary | ICD-10-CM

## 2021-09-26 DIAGNOSIS — Z79899 Other long term (current) drug therapy: Secondary | ICD-10-CM | POA: Diagnosis not present

## 2021-09-26 LAB — COMPREHENSIVE METABOLIC PANEL
ALT: 24 U/L (ref 0–44)
AST: 38 U/L (ref 15–41)
Albumin: 3.7 g/dL (ref 3.5–5.0)
Alkaline Phosphatase: 102 U/L (ref 38–126)
Anion gap: 9 (ref 5–15)
BUN: 23 mg/dL (ref 8–23)
CO2: 24 mmol/L (ref 22–32)
Calcium: 8.6 mg/dL — ABNORMAL LOW (ref 8.9–10.3)
Chloride: 103 mmol/L (ref 98–111)
Creatinine, Ser: 0.89 mg/dL (ref 0.61–1.24)
GFR, Estimated: >60 mL/min (ref >60)
Glucose, Bld: 101 mg/dL — ABNORMAL HIGH (ref 70–99)
Potassium: 4.1 mmol/L (ref 3.5–5.1)
Sodium: 136 mmol/L (ref 135–145)
Total Bilirubin: 0.1 mg/dL — ABNORMAL LOW (ref 0.3–1.2)
Total Protein: 7.6 g/dL (ref 6.5–8.1)

## 2021-09-26 LAB — CBC WITH DIFFERENTIAL/PLATELET
Abs Immature Granulocytes: 0.03 10*3/uL (ref 0.00–0.07)
Basophils Absolute: 0.1 10*3/uL (ref 0.0–0.1)
Basophils Relative: 1 %
Eosinophils Absolute: 0.4 10*3/uL (ref 0.0–0.5)
Eosinophils Relative: 5 %
HCT: 35.7 % — ABNORMAL LOW (ref 39.0–52.0)
Hemoglobin: 11.1 g/dL — ABNORMAL LOW (ref 13.0–17.0)
Immature Granulocytes: 0 %
Lymphocytes Relative: 14 %
Lymphs Abs: 1.1 10*3/uL (ref 0.7–4.0)
MCH: 24.3 pg — ABNORMAL LOW (ref 26.0–34.0)
MCHC: 31.1 g/dL (ref 30.0–36.0)
MCV: 78.1 fL — ABNORMAL LOW (ref 80.0–100.0)
Monocytes Absolute: 0.7 10*3/uL (ref 0.1–1.0)
Monocytes Relative: 8 %
Neutro Abs: 5.7 10*3/uL (ref 1.7–7.7)
Neutrophils Relative %: 72 %
Platelets: 254 10*3/uL (ref 150–400)
RBC: 4.57 MIL/uL (ref 4.22–5.81)
RDW: 14.6 % (ref 11.5–15.5)
WBC: 8 10*3/uL (ref 4.0–10.5)
nRBC: 0 % (ref 0.0–0.2)

## 2021-09-26 MED ORDER — HEPARIN SOD (PORK) LOCK FLUSH 100 UNIT/ML IV SOLN
500.0000 [IU] | Freq: Once | INTRAVENOUS | Status: AC | PRN
  Administered 2021-09-26: 500 [IU]
  Filled 2021-09-26: qty 5

## 2021-09-26 MED ORDER — SODIUM CHLORIDE 0.9 % IV SOLN
200.0000 mg | Freq: Once | INTRAVENOUS | Status: AC
  Administered 2021-09-26: 200 mg via INTRAVENOUS
  Filled 2021-09-26: qty 200

## 2021-09-26 MED ORDER — IRON SUCROSE 20 MG/ML IV SOLN
200.0000 mg | Freq: Once | INTRAVENOUS | Status: AC
  Administered 2021-09-26: 200 mg via INTRAVENOUS
  Filled 2021-09-26: qty 10

## 2021-09-26 MED ORDER — SODIUM CHLORIDE 0.9 % IV SOLN
Freq: Once | INTRAVENOUS | Status: AC
  Filled 2021-09-26: qty 250

## 2021-09-26 NOTE — Assessment & Plan Note (Addendum)
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 On keytruda- FEB 2023- Stable appearance of substantial low-density filling defect in the right renal collecting system, infundibular, and calices as well ?as the right proximal ureter, favoring either tumor or blood products. Right renal atrophy. Very poor excretion from the right ?kidney despite presence of the ureteral stent, which has proximal embedded within the filling defect in the right renal pelvis.  No obvious progression of disease noted on imaging. ? ?# Proceed with  [place] today; Labs today reviewed; Labs today reviewed;  acceptable for treatment today.  ? 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  FEB 2023- CT scan- Focal wall thickening in the proximal sigmoid colon corresponding to the location of prior hypermetabolic activity. Discussed with Dr.Byrnett regarding imaging. ? ?# Iron deficiency anemia-hemoglobin 11-12; FEB 2023- iron studies-7/ferritin-18 LOW; proceed with venofer ? ?# Bilateral LE ulcers-s/p wound care evaluation; s/p PTCA with Dr.Dew. bil Arterial Dopp-wnl-STABLE.  ? ?#Incidental findings on Imaging CT FEB  2023: Aortic/ Coronary atherosclerosis; bronchiectasis in both lower lobes';Right hemicolectomy. Hydrocele;  spermatic cord; I reviewed/discussed/counseled the patient.  ?  ?#DISPOSITION: ?#  Keytruda today; venofer today ?# follow up in 3 weeks-;MD labs-cbc/cmp;  Keytruda;possible venfoer;TSH- Dr.B.  ? ?# I reviewed the blood work- with the patient in detail; also reviewed the imaging independently [as summarized above]; and with the patient in detail.  ? ?

## 2021-09-26 NOTE — Progress Notes (Signed)
Patient did not want to obtain a weight today due to increased pain in legs and difficulty standing.  Does report a weight obtained at facility last week of 158. ?

## 2021-09-26 NOTE — Patient Instructions (Signed)
[facility]  Discharge Instructions: ?Thank you for choosing [facility] to provide your oncology and hematology care.  ?If you have a lab appointment with the Cancer Center, please go directly to the Cancer Center and check in at the registration area. ? ?Wear comfortable clothing and clothing appropriate for easy access to any Portacath or PICC line.  ? ?We strive to give you quality time with your provider. You may need to reschedule your appointment if you arrive late (15 or more minutes).  Arriving late affects you and other patients whose appointments are after yours.  Also, if you miss three or more appointments without notifying the office, you may be dismissed from the clinic at the provider?s discretion.    ?  ?For prescription refill requests, have your pharmacy contact our office and allow 72 hours for refills to be completed.   ? ?  ?To help prevent nausea and vomiting after your treatment, we encourage you to take your nausea medication as directed. ? ?BELOW ARE SYMPTOMS THAT SHOULD BE REPORTED IMMEDIATELY: ?*FEVER GREATER THAN 100.4 F (38 ?C) OR HIGHER ?*CHILLS OR SWEATING ?*NAUSEA AND VOMITING THAT IS NOT CONTROLLED WITH YOUR NAUSEA MEDICATION ?*UNUSUAL SHORTNESS OF BREATH ?*UNUSUAL BRUISING OR BLEEDING ?*URINARY PROBLEMS (pain or burning when urinating, or frequent urination) ?*BOWEL PROBLEMS (unusual diarrhea, constipation, pain near the anus) ?TENDERNESS IN MOUTH AND THROAT WITH OR WITHOUT PRESENCE OF ULCERS (sore throat, sores in mouth, or a toothache) ?UNUSUAL RASH, SWELLING OR PAIN  ?UNUSUAL VAGINAL DISCHARGE OR ITCHING  ? ?Items with * indicate a potential emergency and should be followed up as soon as possible or go to the Emergency Department if any problems should occur. ? ?Please show the CHEMOTHERAPY ALERT CARD or IMMUNOTHERAPY ALERT CARD at check-in to the Emergency Department and triage nurse. ? ?Should you have questions after your visit  or need to cancel or reschedule your appointment, please contact [facility] [place]  [phone] and follow the prompts.  Office hours are 8:00 a.m. to 4:30 p.m. Monday - Friday. Please note that voicemails left after 4:00 p.m. may not be returned until the following business day.  We are closed weekends and major holidays. You have access to a nurse at all times for urgent questions. Please call the main number to the clinic [phone] and follow the prompts. ? ?For any non-urgent questions, you may also contact your provider using MyChart. We now offer e-Visits for anyone 18 and older to request care online for non-urgent symptoms. For details visit [email or web address]. ?  ?Also download the MyChart app! Go to the app store, search "MyChart", open the app, select , and log in with your MyChart username and password. ? ?Due to Covid, a mask is required upon entering the hospital/clinic. If you do not have a mask, one will be given to you upon arrival. For doctor visits, patients may have 1 support person aged 18 or older with them. For treatment visits, patients cannot have anyone with them due to current Covid guidelines and our immunocompromised population.  ?

## 2021-09-26 NOTE — Progress Notes (Signed)
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  Is alone/in a wheelchair.  Chad [name] 61 y.o.  male above history of stage IV-ureteral cancer/history of stage III colon cancer right side; recurrent sigmoid colon cancer [un-resected/under surveillance] and multiple other comorbidities currently on Keytruda is here for follow-up/review results of the CT scan.  Denies any worsening abdominal pain.  Any nausea vomiting or constipation.  No blood in stools or black-colored stools.  No hematuria.  Chronic shortness of breath.  Review of Systems  Constitutional:  Positive for malaise/fatigue. Negative for chills,  diaphoresis, fever and weight loss.  HENT:  Negative for nosebleeds and sore throat.   Eyes:  Negative for double vision.  Respiratory:  Positive for cough and shortness of breath. Negative for hemoptysis and wheezing.   Cardiovascular:  Negative for chest pain, palpitations and orthopnea.  Gastrointestinal:  Positive for constipation. Negative for abdominal pain, blood in stool, diarrhea, heartburn, melena, nausea and vomiting.  Genitourinary:  Negative for dysuria, frequency and urgency.  Musculoskeletal:  Positive for back pain and joint pain.  Skin: Negative.  Negative for itching and rash.  Neurological:  Positive for focal weakness. Negative for dizziness, tingling, weakness and headaches.       Chronic left-sided weakness upper than lower extremity.  Endo/Heme/Allergies:  Does not bruise/bleed easily.  Psychiatric/Behavioral:  Negative for depression. The patient is not nervous/anxious and does not have insomnia.     MEDICAL HISTORY:  Past Medical History:  Diagnosis Date   Anemia    Anxiety    ARF (acute respiratory failure) (HCC)    Bladder cancer (HCC)    COPD (chronic obstructive pulmonary disease) ([place])    Depression    Dysphagia    Family history of colon cancer    Family history of kidney cancer    Family history of leukemia    Family history of prostate cancer    GERD (gastroesophageal reflux disease)    Hepatitis    chronic hep c   Hydronephrosis    Hydronephrosis with ureteral stricture    Hyperlipidemia    Knee pain    Left   Malignant neoplasm of colon ([place])    Nerve pain    Peripheral vascular disease ([place])    Prostate cancer ([place])  Stroke [facility])    Ureteral cancer, right ([place])    Urinary frequency    Venous hypertension of both lower extremities     SURGICAL HISTORY: Past Surgical History:  Procedure Laterality Date   COLON SURGERY     En bloc extended right hemicolectomy 07/2017   COLONOSCOPY WITH PROPOFOL N/A  11/06/2020   Procedure: COLONOSCOPY WITH PROPOFOL;  Surgeon: Jonathon Bellows, MD;  Location: [facility] ENDOSCOPY;  Service: Gastroenterology;  Laterality: N/A;   COLONOSCOPY WITH PROPOFOL N/A 07/31/2021   Procedure: COLONOSCOPY WITH PROPOFOL;  Surgeon: Janai Bellow, MD;  Location: [facility] ENDOSCOPY;  Service: Endoscopy;  Laterality: N/A;   CYSTOSCOPY W/ RETROGRADES Right 08/30/2018   Procedure: CYSTOSCOPY WITH RETROGRADE PYELOGRAM;  Surgeon: Hollice Espy, MD;  Location: [facility] ORS;  Service: Urology;  Laterality: Right;   CYSTOSCOPY WITH STENT PLACEMENT Right 04/25/2018   Procedure: CYSTOSCOPY WITH STENT PLACEMENT;  Surgeon: Hollice Espy, MD;  Location: [facility] ORS;  Service: Urology;  Laterality: Right;   CYSTOSCOPY WITH STENT PLACEMENT Right 08/30/2018   Procedure: [place];  Surgeon: Hollice Espy, MD;  Location: [facility] ORS;  Service: Urology;  Laterality: Right;   CYSTOSCOPY WITH STENT PLACEMENT Right 03/07/2019   Procedure: CYSTOSCOPY WITH STENT [place];  Surgeon: Hollice Espy, MD;  Location: [facility] ORS;  Service: Urology;  Laterality: Right;   CYSTOSCOPY WITH STENT PLACEMENT Right 11/21/2019   Procedure: CYSTOSCOPY WITH STENT [place];  Surgeon: Hollice Espy, MD;  Location: [facility] ORS;  Service: Urology;  Laterality: Right;   LOWER EXTREMITY ANGIOGRAPHY Left 05/23/2019   Procedure: LOWER EXTREMITY ANGIOGRAPHY;  Surgeon: Algernon Huxley, MD;  Location: [place] CV LAB;  Service: Cardiovascular;  Laterality: Left;   LOWER EXTREMITY ANGIOGRAPHY Right 05/30/2019   Procedure: LOWER EXTREMITY ANGIOGRAPHY;  Surgeon: Algernon Huxley, MD;  Location: [place] [facility] LAB;  Service: Cardiovascular;  Laterality: Right;   LOWER EXTREMITY ANGIOGRAPHY Right 02/13/2020   Procedure: LOWER EXTREMITY ANGIOGRAPHY;  Surgeon: Algernon Huxley, MD;  Location: [place] CV LAB;  Service: Cardiovascular;  Laterality: Right;   LOWER EXTREMITY ANGIOGRAPHY Left 02/20/2020   Procedure: LOWER EXTREMITY  ANGIOGRAPHY;  Surgeon: Algernon Huxley, MD;  Location: [place] CV LAB;  Service: Cardiovascular;  Laterality: Left;   PORTA CATH INSERTION N/A 02/28/2019   Procedure: PORTA CATH INSERTION;  Surgeon: Algernon Huxley, MD;  Location: [place] [facility] LAB;  Service: Cardiovascular;  Laterality: N/A;   tumor removed       SOCIAL HISTORY: Social History   Socioeconomic History   Marital status: Single    Spouse name: Not on file   Number of children: Not on file   Years of education: Not on file   Highest education level: Not on file  Occupational History   Not on file  Tobacco Use   Smoking status: Every Day    Packs/day: 1.00    Types: Cigarettes   Smokeless tobacco: Never  Vaping Use   Vaping Use: Never used  Substance and Sexual Activity   Alcohol use: Not Currently   Drug use: Not Currently   Sexual activity: Not Currently  Other Topics Concern   Not on file  Social History Narrative    used to live [place]; moved  To [place]- end of April 2019; in Nursing home; 1pp/day; quit alcohol. 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        Family- dad- prostate ca [at 52y]; brother- 34 died of prostate cancer; brother- 86- no cancers [[place]]; son- [name] [  [place]];[name]-32y prostate ca [facility] [place]]; daughter- 57 [[place]]; another daughter 72 [NM/addict]. will refer genetics counseling. Given MSI- abnormal; highly suspicious of Lynch syndrome.  Patient's son [name] aware of high possible lynch syndrome.   Social Determinants of Health   Financial Resource Strain: Not on file  Food Insecurity: Not on file  Transportation Needs: Not on file  Physical Activity: Not on file  Stress: Not on file  Social Connections: Not on file  Intimate Partner Violence: Not on file    FAMILY HISTORY: Family History  Problem Relation Age of Onset   Prostate cancer Father 33   Cancer Brother 69       unsure type   Cancer Paternal Uncle        unsure type   Cancer Maternal Grandmother         unsure type   Cancer Paternal Grandmother        unsure type   Kidney cancer Paternal Grandfather    Cancer Other        unsure types   Leukemia Son    Cancer Son        other cancers, possibly colon    ALLERGIES:  is allergic to penicillins.  MEDICATIONS:  Current Outpatient Medications  Medication Sig Dispense Refill   acetaminophen (TYLENOL) 325 MG tablet Take 650 mg by mouth 3 (three) times daily.      aspirin EC 81 MG tablet Take 1 tablet (81 mg total) by mouth daily. 150 tablet 2   atorvastatin (LIPITOR) 10 MG tablet Take 1 tablet (10 mg total) by mouth daily. 30 tablet 11   carboxymethylcellulose 1 % ophthalmic solution Apply 1 drop to eye 2 (two) times daily.     clopidogrel (PLAVIX) 75 MG tablet Take 1 tablet (75 mg total) by mouth daily. 30 tablet 11   cyclobenzaprine (FLEXERIL) 10 MG tablet Take 10 mg by mouth at bedtime.     escitalopram (LEXAPRO) 5 MG tablet Take 5 mg by mouth daily.     gabapentin (NEURONTIN) 100 MG capsule Take 100 mg by mouth 3 (three) times daily.     mirtazapine (REMERON) 7.5 MG tablet Take 7.5 mg by mouth at bedtime.     oxybutynin (DITROPAN-XL) 5 MG 24 hr tablet Take 5 mg by mouth at bedtime.     Oxycodone HCl 10 MG TABS Take 10 mg by mouth every 6 (six) hours as needed for pain.     pantoprazole (PROTONIX) 40 MG tablet Take 40 mg by mouth daily.     senna-docusate (SENOKOT-S) 8.6-50 MG tablet Take 1 tablet by mouth daily.     Cholecalciferol (VITAMIN D) 50 MCG (2000 UT) CAPS Take by mouth. (Patient not taking: Reported on 08/14/2021)     Cholecalciferol 50 MCG (2000 UT) CAPS  (Patient not taking: Reported on 08/14/2021)     doxycycline (VIBRA-TABS) 100 MG tablet Take 100 mg by mouth 2 (two) times daily. (Patient not taking: Reported on 08/14/2021)     polyethylene glycol powder (GLYCOLAX/MIRALAX) 17 GM/SCOOP powder  (Patient not taking: Reported on 09/26/2021)     RESTORE CALCIUM ALGINATE EX Apply topically. Note: Dressing  located on left ankle. Facility cleaning wound "every day shift and applying wound cleanser and appication of calcium Alginate and cover with dry dressing and wrapped with Kerlix" (Patient not taking: Reported on 08/14/2021)     tamsulosin (FLOMAX) 0.4 MG CAPS capsule Take 0.4 mg by mouth daily. (Patient not taking: Reported on 08/14/2021)     No  current facility-administered medications for this visit.   Facility-Administered Medications Ordered in Other Visits  Medication Dose Route Frequency Provider Last Rate Last Admin   heparin lock flush 100 unit/mL  500 Units Intracatheter Once PRN Cammie Sickle, MD       pembrolizumab [facility]) 200 mg in sodium chloride 0.9 % 50 mL chemo infusion  200 mg Intravenous Once Cammie Sickle, MD 116 mL/hr at 09/26/21 1032 200 mg at 09/26/21 1032      .  PHYSICAL EXAMINATION: ECOG PERFORMANCE STATUS: 1 - Symptomatic but completely ambulatory  Vitals:   09/26/21 0900  BP: 101/69  Pulse: 82  Resp: 12  Temp: 98.2 F (36.8 C)   Filed Weights   09/26/21 0900  Weight: 158 lb (71.7 kg)     Physical Exam HENT:     Head: Normocephalic and atraumatic.     Mouth/Throat:     Pharynx: No oropharyngeal exudate.  Eyes:     Pupils: Pupils are equal, round, and reactive to light.  Cardiovascular:     Rate and Rhythm: Normal rate and regular rhythm.  Pulmonary:     Effort: No respiratory distress.     Breath sounds: No wheezing.  Abdominal:     General: Bowel sounds are normal. There is no distension.     Palpations: Abdomen is soft. There is no mass.     Tenderness: There is no abdominal tenderness. There is no guarding or rebound.  Musculoskeletal:        General: No tenderness. Normal range of motion.     Cervical back: Normal range of motion and neck supple.  Skin:    General: Skin is warm.     Comments: Bilateral lower extremity ulcerations noted.  Pulses intact bilaterally.  Cystlike lesion noted right elbow-nontender.  No  erythema noted.  Neurological:     Mental Status: He is oriented to person, place, and time.     Comments: Sleepy but easily arousable.  Chronic weakness left upper and lower extremity.  Psychiatric:        Mood and Affect: Affect normal.     LABORATORY DATA:  I have reviewed the data as listed Lab Results  Component Value Date   WBC 8.0 09/26/2021   HGB 11.1 (L) 09/26/2021   HCT 35.7 (L) 09/26/2021   MCV 78.1 (L) 09/26/2021   PLT 254 09/26/2021   Recent Labs    08/14/21 1256 09/04/21 1337 09/26/21 0833  NA 133* 138 136  K 4.7 3.8 4.1  CL 104 104 103  CO2 '23 26 24  ' GLUCOSE 82 93 101*  BUN '21 17 23  ' CREATININE 1.05 0.89 0.89  CALCIUM 8.5* 8.6* 8.6*  GFRNONAA >60 >60 >60  PROT 7.4 7.4 7.6  ALBUMIN 3.9 3.4* 3.7  AST 31 33 38  ALT '19 20 24  ' ALKPHOS 101 104 102  BILITOT 0.2* 0.4 0.1*    RADIOGRAPHIC STUDIES: I have personally reviewed the radiological images as listed and agreed with the findings in the report. CT CHEST ABDOMEN PELVIS W CONTRAST  Result Date: 09/25/2021 CLINICAL DATA:  Metastatic urothelial cancer. Mucinous carcinoma of the sigmoid colon. History of right colon cancer in 2019. Most recent therapy Keytruda. EXAM: CT CHEST, ABDOMEN, AND PELVIS WITH CONTRAST TECHNIQUE: Multidetector CT imaging of the chest, abdomen and pelvis was performed following the standard protocol during bolus administration of intravenous contrast. RADIATION DOSE REDUCTION: This exam was performed according to the departmental dose-optimization program which includes automated exposure control, adjustment  of the mA and/or kV according to patient size and/or use of iterative reconstruction technique. CONTRAST:  167m OMNIPAQUE IOHEXOL 300 MG/ML  SOLN COMPARISON:  Multiple exams, including 04/30/2021 FINDINGS: CT CHEST FINDINGS Cardiovascular: Right Port-A-Cath tip: Right atrium. Coronary, aortic arch, and branch vessel atherosclerotic vascular disease. Mediastinum/Nodes: Small mediastinal  lymph nodes are not pathologically enlarged. Parenchymal portion of the index right axial lymph node measures 0.8 cm short axis on image 17 series 2, stable. Lungs/Pleura: Biapical pleuroparenchymal scarring is unchanged. Old granulomatous disease noted. Mild cylindrical bronchiectasis in both lower lobes. Stable lingular scarring. Mild atelectasis along the left posterior hemidiaphragm similar to prior. Mild increase of bandlike density with mild nodularity in the left lower lobe adjacent to the diaphragm on image 108 series 4, not morphologically suspicious for metastatic disease but meriting surveillance. Filling defect in the right mainstem bronchus shown on the prior exam has resolved, compatible with mucus. Musculoskeletal: Unremarkable CT ABDOMEN PELVIS FINDINGS Hepatobiliary: Unremarkable Pancreas: Unremarkable Spleen: Unremarkable Adrenals/Urinary Tract: Both adrenal glands appear normal. Left kidney lower pole cyst noted. Right double-J ureteral stent observed. Substantial low-density filling defect in the right renal pelvis, infundibular, and calices similar to 156/21/3086 Just below this filling defect there is circumferential ureteral wall thickening as on image 21 series 7, surrounding the stent. There is a small amount of peripheral calyceal excretion of contrast. Cortical thinning and atrophy of the right kidney are again observed. There is little to no contrast medium along the collecting system or tracking in the ureter adjacent to the ureteral stent on the right. No filling defect is identified in the left renal collecting system or left ureter. No definite bladder mass identified. Stomach/Bowel: Right hemicolectomy. Mild prominence of formed stool the descending and sigmoid colon. In the proximal sigmoid colon near the site of prior hypermetabolic activity, there is some localized low-density wall thickening potentially with central ulceration and also with a small amount of abnormal  fluid/stranding in the paracolic region. This stranding and fluid density is new from 04/30/2021. No extraluminal gas is identified. Adjacent lymph node or nodule along the mesenteric margin of the adjacent sigmoid colon measures 7 mm in short axis on image 95 series 2 and appears increased from prior. Adjacent regional mesenteric lymph node measures 0.6 cm in short axis on image 94 series 2, formerly 0.4 cm. Vascular/Lymphatic: Atherosclerosis is present, including aortoiliac atherosclerotic disease. Small paracolic lymph nodes adjacent to the sigmoid colon as noted above. Small retroperitoneal and pelvic lymph nodes are not pathologically enlarged. Reproductive: Fluid density lesion along the right spermatic cord, potentially a hydrocele. Previous fluid density lesion along the left spermatic cord has resolved. Other: No supplemental non-categorized findings. Musculoskeletal: Moderate degenerative hip arthropathy bilaterally. IMPRESSION: 1. Stable appearance of substantial low-density filling defect in the right renal collecting system, infundibular, and calices as well as the right proximal ureter, favoring either tumor or blood products. Right renal atrophy. Very poor excretion from the right kidney despite presence of the ureteral stent, which has proximal embedded within the filling defect in the right renal pelvis. 2. Focal wall thickening in the proximal sigmoid colon corresponding to the location of prior hypermetabolic activity. There is currently stranding and trace fluid adjacent to this lesion as well as some subtle adjacent nodularity, some of this appearance could be reactive and biopsy related although local extension of tumor is difficult to exclude given the appearance. 3. Mild bandlike density with subtle nodularity in the left lower lobe near the diaphragm, probably incidental or inflammatory,  surveillance suggested. 4. Previously observed filling defect in the right mainstem bronchus has  resolved, compatible with mucus. 5. Other imaging findings of potential clinical significance: Aortic Atherosclerosis (ICD10-I70.0). Coronary atherosclerosis. Systemic atherosclerosis. Old granulomatous disease. Mild cylindrical bronchiectasis in both lower lobes. Mild atelectasis along the left hemidiaphragm. Right hemicolectomy. Mild prominence of formed stool in the descending and sigmoid colon. Hydrocele along the right spermatic cord. * onc * Electronically Signed   By: Van Clines M.D.   On: 09/25/2021 12:25    ASSESSMENT & PLAN:   Ureteral cancer, right ([place]) # High-grade urothelial cancer/cytology; likely of the right renal pelvis /upper ureter. On keytruda- FEB 2023- Stable appearance of substantial low-density filling defect in the right renal collecting system, infundibular, and calices as well as the right proximal ureter, favoring either tumor or blood products. Right renal atrophy. Very poor excretion from the right kidney despite presence of the ureteral stent, which has proximal embedded within the filling defect in the right renal pelvis.  No obvious progression of disease noted on imaging.  # Proceed with  [place] today; Labs today reviewed; Labs today reviewed;  acceptable for treatment today.   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  FEB 2023- CT scan- Focal wall thickening in the proximal sigmoid colon corresponding to the location of prior hypermetabolic activity. Discussed with Dr.Byrnett regarding imaging.  # Iron deficiency anemia-hemoglobin 11-12; FEB 2023- iron studies-7/ferritin-18 LOW; proceed with venofer  # Bilateral LE ulcers-s/p wound care evaluation; s/p PTCA with Dr.Dew. bil Arterial Dopp-wnl-STABLE.   #Incidental findings on Imaging CT FEB  2023: Aortic/ Coronary atherosclerosis; bronchiectasis in both lower lobes';Right hemicolectomy. Hydrocele;  spermatic  cord; I reviewed/discussed/counseled the patient.    #DISPOSITION: #  Keytruda today; venofer today # follow up in 3 weeks-;MD labs-cbc/cmp;  Keytruda;possible venfoer;TSH- Dr.B.   # I reviewed the blood work- with the patient in detail; also reviewed the imaging independently [as summarized above]; and with the patient in detail.    All questions were answered. The patient knows to call the clinic with any problems, questions or concerns.    Cammie Sickle, MD 09/26/2021 10:36 AM

## 2021-10-13 ENCOUNTER — Other Ambulatory Visit: Payer: Self-pay | Admitting: Internal Medicine

## 2021-10-17 ENCOUNTER — Inpatient Hospital Stay: Payer: Medicaid Other

## 2021-10-17 ENCOUNTER — Other Ambulatory Visit: Payer: Self-pay

## 2021-10-17 ENCOUNTER — Encounter: Payer: Self-pay | Admitting: Nurse Practitioner

## 2021-10-17 ENCOUNTER — Inpatient Hospital Stay (HOSPITAL_BASED_OUTPATIENT_CLINIC_OR_DEPARTMENT_OTHER): Payer: Medicaid Other | Admitting: Nurse Practitioner

## 2021-10-17 VITALS — BP 110/77 | HR 76 | Temp 98.6°F | Resp 17 | Wt 160.0 lb

## 2021-10-17 DIAGNOSIS — C661 Malignant neoplasm of right ureter: Secondary | ICD-10-CM | POA: Diagnosis not present

## 2021-10-17 DIAGNOSIS — Z7189 Other specified counseling: Secondary | ICD-10-CM

## 2021-10-17 DIAGNOSIS — Z515 Encounter for palliative care: Secondary | ICD-10-CM

## 2021-10-17 DIAGNOSIS — D500 Iron deficiency anemia secondary to blood loss (chronic): Secondary | ICD-10-CM

## 2021-10-17 DIAGNOSIS — Z5112 Encounter for antineoplastic immunotherapy: Secondary | ICD-10-CM

## 2021-10-17 DIAGNOSIS — C187 Malignant neoplasm of sigmoid colon: Secondary | ICD-10-CM

## 2021-10-17 LAB — CBC WITH DIFFERENTIAL/PLATELET
Abs Immature Granulocytes: 0.02 10*3/uL (ref 0.00–0.07)
Basophils Absolute: 0.1 10*3/uL (ref 0.0–0.1)
Basophils Relative: 1 %
Eosinophils Absolute: 0.3 10*3/uL (ref 0.0–0.5)
Eosinophils Relative: 5 %
HCT: 38 % — ABNORMAL LOW (ref 39.0–52.0)
Hemoglobin: 11.8 g/dL — ABNORMAL LOW (ref 13.0–17.0)
Immature Granulocytes: 0 %
Lymphocytes Relative: 18 %
Lymphs Abs: 1.2 10*3/uL (ref 0.7–4.0)
MCH: 24.8 pg — ABNORMAL LOW (ref 26.0–34.0)
MCHC: 31.1 g/dL (ref 30.0–36.0)
MCV: 79.8 fL — ABNORMAL LOW (ref 80.0–100.0)
Monocytes Absolute: 0.5 10*3/uL (ref 0.1–1.0)
Monocytes Relative: 8 %
Neutro Abs: 4.7 10*3/uL (ref 1.7–7.7)
Neutrophils Relative %: 68 %
Platelets: 198 10*3/uL (ref 150–400)
RBC: 4.76 MIL/uL (ref 4.22–5.81)
RDW: 16.2 % — ABNORMAL HIGH (ref 11.5–15.5)
WBC: 6.9 10*3/uL (ref 4.0–10.5)
nRBC: 0 % (ref 0.0–0.2)

## 2021-10-17 LAB — COMPREHENSIVE METABOLIC PANEL
ALT: 21 U/L (ref 0–44)
AST: 35 U/L (ref 15–41)
Albumin: 3.5 g/dL (ref 3.5–5.0)
Alkaline Phosphatase: 101 U/L (ref 38–126)
Anion gap: 7 (ref 5–15)
BUN: 19 mg/dL (ref 8–23)
CO2: 25 mmol/L (ref 22–32)
Calcium: 8.7 mg/dL — ABNORMAL LOW (ref 8.9–10.3)
Chloride: 105 mmol/L (ref 98–111)
Creatinine, Ser: 1.01 mg/dL (ref 0.61–1.24)
GFR, Estimated: >60 mL/min (ref >60)
Glucose, Bld: 90 mg/dL (ref 70–99)
Potassium: 4.2 mmol/L (ref 3.5–5.1)
Sodium: 137 mmol/L (ref 135–145)
Total Bilirubin: 0.3 mg/dL (ref 0.3–1.2)
Total Protein: 7.6 g/dL (ref 6.5–8.1)

## 2021-10-17 LAB — TSH: TSH: 2.511 u[IU]/mL (ref 0.350–4.500)

## 2021-10-17 MED ORDER — HEPARIN SOD (PORK) LOCK FLUSH 100 UNIT/ML IV SOLN
500.0000 [IU] | Freq: Once | INTRAVENOUS | Status: DC | PRN
  Filled 2021-10-17: qty 5

## 2021-10-17 MED ORDER — HEPARIN SOD (PORK) LOCK FLUSH 100 UNIT/ML IV SOLN
500.0000 [IU] | Freq: Once | INTRAVENOUS | Status: AC
  Administered 2021-10-17: 500 [IU] via INTRAVENOUS
  Filled 2021-10-17: qty 5

## 2021-10-17 MED ORDER — SODIUM CHLORIDE 0.9 % IV SOLN
Freq: Once | INTRAVENOUS | Status: AC
  Filled 2021-10-17: qty 250

## 2021-10-17 MED ORDER — SODIUM CHLORIDE 0.9 % IV SOLN
200.0000 mg | Freq: Once | INTRAVENOUS | Status: AC
  Administered 2021-10-17: 200 mg via INTRAVENOUS
  Filled 2021-10-17: qty 200

## 2021-10-17 MED ORDER — IRON SUCROSE 20 MG/ML IV SOLN
200.0000 mg | Freq: Once | INTRAVENOUS | Status: AC
  Administered 2021-10-17: 200 mg via INTRAVENOUS
  Filled 2021-10-17: qty 10

## 2021-10-17 MED ORDER — SODIUM CHLORIDE 0.9 % IV SOLN
Freq: Once | INTRAVENOUS | Status: DC
  Filled 2021-10-17: qty 250

## 2021-10-17 MED ORDER — SODIUM CHLORIDE 0.9% FLUSH
10.0000 mL | Freq: Once | INTRAVENOUS | Status: AC
  Administered 2021-10-17: 10 mL via INTRAVENOUS
  Filled 2021-10-17: qty 10

## 2021-10-17 NOTE — Progress Notes (Signed)
No new complaints or concerns at this time.    ?

## 2021-10-17 NOTE — Patient Instructions (Signed)
[facility] CANCER CTR AT [place]  Discharge Instructions: ?Thank you for choosing [facility] to provide your oncology and hematology care.  ?If you have a lab appointment with the [facility], please go directly to the [facility] and check in at the registration area. ? ?Wear comfortable clothing and clothing appropriate for easy access to any Portacath or PICC line.  ? ?We strive to give you quality time with your provider. You may need to reschedule your appointment if you arrive late (15 or more minutes).  Arriving late affects you and other patients whose appointments are after yours.  Also, if you miss three or more appointments without notifying the office, you may be dismissed from the clinic at the provider?s discretion.    ?  ?For prescription refill requests, have your pharmacy contact our office and allow 72 hours for refills to be completed.   ? ?Today you received the following chemotherapy and/or immunotherapy agents KEYTRUDA, VENFOER    ?  ?To help prevent nausea and vomiting after your treatment, we encourage you to take your nausea medication as directed. ? ?BELOW ARE SYMPTOMS THAT SHOULD BE REPORTED IMMEDIATELY: ?*FEVER GREATER THAN 100.4 F (38 ?C) OR HIGHER ?*CHILLS OR SWEATING ?*NAUSEA AND VOMITING THAT IS NOT CONTROLLED WITH YOUR NAUSEA MEDICATION ?*UNUSUAL SHORTNESS OF BREATH ?*UNUSUAL BRUISING OR BLEEDING ?*URINARY PROBLEMS (pain or burning when urinating, or frequent urination) ?*BOWEL PROBLEMS (unusual diarrhea, constipation, pain near the anus) ?TENDERNESS IN MOUTH AND THROAT WITH OR WITHOUT PRESENCE OF ULCERS (sore throat, sores in mouth, or a toothache) ?UNUSUAL RASH, SWELLING OR PAIN  ?UNUSUAL VAGINAL DISCHARGE OR ITCHING  ? ?Items with * indicate a potential emergency and should be followed up as soon as possible or go to the Emergency Department if any problems should occur. ? ?Please show the CHEMOTHERAPY ALERT CARD or IMMUNOTHERAPY ALERT CARD at  check-in to the Emergency Department and triage nurse. ? ?Should you have questions after your visit or need to cancel or reschedule your appointment, please contact [facility] CANCER [place] AT [place]  [phone] and follow the prompts.  Office hours are 8:00 a.m. to 4:30 p.m. Monday - Friday. Please note that voicemails left after 4:00 p.m. may not be returned until the following business day.  We are closed weekends and major holidays. You have access to a nurse at all times for urgent questions. Please call the main number to the clinic [phone] and follow the prompts. ? ?For any non-urgent questions, you may also contact your provider using MyChart. We now offer e-Visits for anyone 63 and older to request care online for non-urgent symptoms. For details visit [email or web address]. ?  ?Also download the MyChart app! Go to the app store, search "MyChart", open the app, select [facility], and log in with your MyChart username and password. ? ?Due to Covid, a mask is required upon entering the hospital/clinic. If you do not have a mask, one will be given to you upon arrival. For doctor visits, patients may have 1 support person aged 63 or older with them. For treatment visits, patients cannot have anyone with them due to current Covid guidelines and our immunocompromised population.  ? ?Pembrolizumab injection ?What is this medication? ?PEMBROLIZUMAB (pem broe liz ue mab) is a monoclonal antibody. It is used to treat certain types of cancer. ?This medicine may be used for other purposes; ask your health care provider or pharmacist if you have questions. ?COMMON BRAND NAME(S): Keytruda ?What should I tell my care  team before I take this medication? ?They need to know if you have any of these conditions: ?autoimmune diseases like Crohn's disease, ulcerative colitis, or lupus ?have had or planning to have an allogeneic stem cell transplant (uses someone else's stem cells) ?history of organ  transplant ?history of chest radiation ?nervous system problems like myasthenia gravis or Guillain-Barre syndrome ?an unusual or allergic reaction to pembrolizumab, other medicines, foods, dyes, or preservatives ?pregnant or trying to get pregnant ?breast-feeding ?How should I use this medication? ?This medicine is for infusion into a vein. It is given by a health care professional in a hospital or clinic setting. ?A special MedGuide will be given to you before each treatment. Be sure to read this information carefully each time. ?Talk to your pediatrician regarding the use of this medicine in children. While this drug may be prescribed for children as young as 6 months for selected conditions, precautions do apply. ?Overdosage: If you think you have taken too much of this medicine contact a poison control center or emergency room at once. ?NOTE: This medicine is only for you. Do not share this medicine with others. ?What if I miss a dose? ?It is important not to miss your dose. Call your doctor or health care professional if you are unable to keep an appointment. ?What may interact with this medication? ?Interactions have not been studied. ?This list may not describe all possible interactions. Give your health care provider a list of all the medicines, herbs, non-prescription drugs, or dietary supplements you use. Also tell them if you smoke, drink alcohol, or use illegal drugs. Some items may interact with your medicine. ?What should I watch for while using this medication? ?Your condition will be monitored carefully while you are receiving this medicine. ?You may need blood work done while you are taking this medicine. ?Do not become pregnant while taking this medicine or for 4 months after stopping it. Women should inform their doctor if they wish to become pregnant or think they might be pregnant. There is a potential for serious side effects to an unborn child. Talk to your health care professional or  pharmacist for more information. Do not breast-feed an infant while taking this medicine or for 4 months after the last dose. ?What side effects may I notice from receiving this medication? ?Side effects that you should report to your doctor or health care professional as soon as possible: ?allergic reactions like skin rash, itching or hives, swelling of the face, lips, or tongue ?bloody or black, tarry ?breathing problems ?changes in vision ?chest pain ?chills ?confusion ?constipation ?cough ?diarrhea ?dizziness or feeling faint or lightheaded ?fast or irregular heartbeat ?fever ?flushing ?joint pain ?low blood counts - this medicine may decrease the number of white blood cells, red blood cells and platelets. You may be at increased risk for infections and bleeding. ?muscle pain ?muscle weakness ?pain, tingling, numbness in the hands or feet ?persistent headache ?redness, blistering, peeling or loosening of the skin, including inside the mouth ?signs and symptoms of high blood sugar such as dizziness; dry mouth; dry skin; fruity breath; nausea; stomach pain; increased hunger or thirst; increased urination ?signs and symptoms of kidney injury like trouble passing urine or change in the amount of urine ?signs and symptoms of liver injury like dark urine, light-colored stools, loss of appetite, nausea, right upper belly pain, yellowing of the eyes or skin ?sweating ?swollen lymph nodes ?weight loss ?Side effects that usually do not require medical attention (report to your doctor  or health care professional if they continue or are bothersome): ?decreased appetite ?hair loss ?tiredness ?This list may not describe all possible side effects. Call your doctor for medical advice about side effects. You may report side effects to FDA at 1-800-FDA-1088. ?Where should I keep my medication? ?This drug is given in a hospital or clinic and will not be stored at home. ?NOTE: This sheet is a summary. It may not cover all possible  information. If you have questions about this medicine, talk to your doctor, pharmacist, or health care provider. ?? 2022 Elsevier/Gold Standard (2021-04-02 00:00:00) ? ?Iron Sucrose Injection ?What is this medication? ?IR

## 2021-10-17 NOTE — Progress Notes (Signed)
[facility] ?CONSULT NOTE ? ?Patient Care Team: ?Pcp, No as PCP - General ?Cammie Sickle, MD as Consulting Physician (Internal Medicine) ?Byrne Bellow, MD as Consulting Physician (General Surgery) ? ?CHIEF COMPLAINTS/PURPOSE OF CONSULTATION: Urothelial cancer ? ?#  ?Oncology History Overview Note  ?# SEP-OCT 2019-right renal pelvis/ ureteral [cytology positive HIGH grade urothelial carcinoma [Dr.Brandon]   ? ?# NOV 24th 2019-Keytruda [consent] ? 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 ?#Right ureteral obstruction status post stent placement ? ?# JAN 2019- Right Colon ca [ T4N1]  [[facility]]; NO adjuvant therapy ? ?# Hep C/ # stroke of left side/weakness-2018 Nov [[place]]; active smoker ? 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 ?GOALS: palliative ? ?CURRENT/MOST RECENT THERAPY: Keytruda [C] ? ?  ?Urothelial cancer ([place])  ? Initial Diagnosis  ? Urothelial cancer ([place]) ?  ?Ureteral cancer, right ([place])  ?05/26/2018 Initial Diagnosis  ? Ureteral cancer, right ([place]) ?  ?06/21/2018 -  Chemotherapy  ? Patient is on Treatment Plan : urothelial cancer- pembrolizumab q21d  ?   ? ? ? ?HISTORY OF PRESENTING ILLNESS: Patient is a poor historian.  Is alone/in a wheelchair. ? ?Chad [name] 61 y.o.  male above history of stage IV-ureteral cancer/history of stage III colon cancer right side; recurrent sigmoid colon cancer [un-resected/under surveillance] and multiple other comorbidities currently on Keytruda is here for follow-up and consideration of [place]. Abdominal pain is stable. Chronic shortness of breath. Denies complaints.  ? ?Review of Systems  ?Reason unable to perform ROS: poor historian.  ?Constitutional:  Positive for malaise/fatigue. Negative for chills, diaphoresis, fever and weight loss.  ?HENT:  Negative  for nosebleeds and sore throat.   ?Eyes:  Negative for double vision.  ?Respiratory:  Positive for cough. Negative for hemoptysis, shortness of breath and wheezing.   ?Cardiovascular:  Negative for chest pain, palpitations and orthopnea.  ?Gastrointestinal:  Negative for abdominal pain, blood in stool, constipation, diarrhea, heartburn, melena, nausea and vomiting.  ?Genitourinary:  Negative for dysuria, frequency and urgency.  ?Musculoskeletal:  Positive for back pain and joint pain.  ?Skin: Negative.  Negative for itching and rash.  ?Neurological:  Positive for focal weakness. Negative for dizziness, tingling, weakness and headaches.  ?     Chronic left-sided weakness upper than lower extremity.  ?Endo/Heme/Allergies:  Does not bruise/bleed easily.  ?Psychiatric/Behavioral:  Negative for depression. The patient is not nervous/anxious and does not have insomnia.    ? ?MEDICAL HISTORY:  ?Past Medical History:  ?Diagnosis Date  ? Anemia   ? Anxiety   ? ARF (acute respiratory failure) ([place])   ? Bladder cancer ([place])   ? COPD (chronic obstructive pulmonary disease) ([place])   ? Depression   ? Dysphagia   ? Family history of colon cancer   ? Family history of kidney cancer   ? Family history of leukemia   ? Family history of prostate cancer   ? GERD (gastroesophageal reflux disease)   ? Hepatitis   ? chronic hep c  ? Hydronephrosis   ? Hydronephrosis with ureteral stricture   ? Hyperlipidemia   ? Knee pain   ? Left  ? Malignant neoplasm of colon ([place])   ? Nerve pain   ? Peripheral vascular disease ([place])   ? Prostate cancer ([place])   ? Stroke [facility])   ? Ureteral cancer, right ([place])   ?  Urinary frequency   ? Venous hypertension of both lower extremities   ? ? ?SURGICAL HISTORY: ?Past Surgical History:  ?Procedure Laterality Date  ? COLON SURGERY    ? En bloc extended right hemicolectomy 07/2017  ? COLONOSCOPY WITH PROPOFOL N/A 11/06/2020  ? Procedure: COLONOSCOPY WITH PROPOFOL;  Surgeon: Jonathon Bellows, MD;  Location: [facility] ENDOSCOPY;   Service: Gastroenterology;  Laterality: N/A;  ? COLONOSCOPY WITH PROPOFOL N/A 07/31/2021  ? Procedure: COLONOSCOPY WITH PROPOFOL;  Surgeon: Eileen Bellow, MD;  Location: [facility] ENDOSCOPY;  Service: Endoscopy;  Laterality: N/A;  ? CYSTOSCOPY W/ RETROGRADES Right 08/30/2018  ? Procedure: CYSTOSCOPY WITH RETROGRADE PYELOGRAM;  Surgeon: Hollice Espy, MD;  Location: [facility] ORS;  Service: Urology;  Laterality: Right;  ? CYSTOSCOPY WITH STENT PLACEMENT Right 04/25/2018  ? Procedure: CYSTOSCOPY WITH STENT PLACEMENT;  Surgeon: Hollice Espy, MD;  Location: [facility] ORS;  Service: Urology;  Laterality: Right;  ? CYSTOSCOPY WITH STENT PLACEMENT Right 08/30/2018  ? Procedure: CYSTOSCOPY WITH STENT Exchange;  Surgeon: Hollice Espy, MD;  Location: [facility] ORS;  Service: Urology;  Laterality: Right;  ? CYSTOSCOPY WITH STENT PLACEMENT Right 03/07/2019  ? Procedure: CYSTOSCOPY WITH STENT Exchange;  Surgeon: Hollice Espy, MD;  Location: [facility] ORS;  Service: Urology;  Laterality: Right;  ? CYSTOSCOPY WITH STENT PLACEMENT Right 11/21/2019  ? Procedure: CYSTOSCOPY WITH STENT Exchange;  Surgeon: Hollice Espy, MD;  Location: [facility] ORS;  Service: Urology;  Laterality: Right;  ? LOWER EXTREMITY ANGIOGRAPHY Left 05/23/2019  ? Procedure: LOWER EXTREMITY ANGIOGRAPHY;  Surgeon: Algernon Huxley, MD;  Location: [facility] CV LAB;  Service: Cardiovascular;  Laterality: Left;  ? LOWER EXTREMITY ANGIOGRAPHY Right 05/30/2019  ? Procedure: LOWER EXTREMITY ANGIOGRAPHY;  Surgeon: Algernon Huxley, MD;  Location: [facility] LAB;  Service: Cardiovascular;  Laterality: Right;  ? LOWER EXTREMITY ANGIOGRAPHY Right 02/13/2020  ? Procedure: LOWER EXTREMITY ANGIOGRAPHY;  Surgeon: Algernon Huxley, MD;  Location: [facility] LAB;  Service: Cardiovascular;  Laterality: Right;  ? LOWER EXTREMITY ANGIOGRAPHY Left 02/20/2020  ? Procedure: LOWER EXTREMITY ANGIOGRAPHY;  Surgeon: Algernon Huxley, MD;  Location: [facility] LAB;  Service: Cardiovascular;  Laterality:  Left;  ? PORTA CATH INSERTION N/A 02/28/2019  ? Procedure: PORTA CATH INSERTION;  Surgeon: Algernon Huxley, MD;  Location: [facility] LAB;  Service: Cardiovascular;  Laterality: N/A;  ? tumor removed     ? ? ?SOCIAL HISTORY: ?Social History  ? ?Socioeconomic History  ? Marital status: Single  ?  Spouse name: Not on file  ? Number of children: Not on file  ? Years of education: Not on file  ? Highest education level: Not on file  ?Occupational History  ? Not on file  ?Tobacco Use  ? Smoking status: Every Day  ?  Packs/day: 1.00  ?  Types: Cigarettes  ? Smokeless tobacco: Never  ?Vaping Use  ? Vaping Use: Never used  ?Substance and Sexual Activity  ? Alcohol use: Not Currently  ? Drug use: Not Currently  ? Sexual activity: Not Currently  ?Other Topics Concern  ? Not on file  ?Social History Narrative  ?  used to live [place]; moved  To [place]- end of April 2019; in Nursing home; 1pp/day; quit alcohol. 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   ?   ?  Family- dad- prostate ca [at 65y]; brother- 23 died of prostate cancer; brother- 90- no cancers [[place]]; sonGerald Stabs [[place]];[name]-32y prostate ca [facility] [place]]; daughter- 53 [[place]]; another daughter 20 [[place]/addict].  will refer genetics counseling. Given MSI- abnormal; highly suspicious of Lynch syndrome.  Patient's son [name] aware of high possible lynch syndrome.  ? ?Social Determinants of Health  ? ?Financial Resource Strain: Not on file  ?Food Insecurity: Not on file  ?Transportation Needs: Not on file  ?Physical Activity: Not on file  ?Stress: Not on file  ?Social Connections: Not on file  ?Intimate Partner Violence: Not on file  ? ? ?FAMILY HISTORY: ?Family History  ?Problem Relation Age of Onset  ? Prostate cancer Father 29  ? Cancer Brother 53  ?     unsure type  ? Cancer Paternal Uncle   ?     unsure type  ? Cancer Maternal Grandmother   ?     unsure type  ? Cancer Paternal Grandmother   ?     unsure type  ? Kidney cancer Paternal Grandfather   ? Cancer Other   ?     unsure  types  ? Leukemia Son   ? Cancer Son   ?     other cancers, possibly colon  ? ? ?ALLERGIES:  is allergic to penicillins. ? ?MEDICATIONS:  ?Current Outpatient Medications  ?Medication Sig Dispense Refill

## 2021-10-20 ENCOUNTER — Encounter: Payer: Self-pay | Admitting: Internal Medicine

## 2021-10-24 ENCOUNTER — Ambulatory Visit: Payer: Medicaid Other

## 2021-10-25 ENCOUNTER — Encounter: Payer: Self-pay | Admitting: Nurse Practitioner

## 2021-10-25 ENCOUNTER — Non-Acute Institutional Stay: Payer: Medicaid Other | Admitting: Nurse Practitioner

## 2021-10-25 VITALS — BP 118/76 | HR 74 | Temp 97.0°F | Resp 18 | Wt 156.1 lb

## 2021-10-25 DIAGNOSIS — G894 Chronic pain syndrome: Secondary | ICD-10-CM

## 2021-10-25 DIAGNOSIS — R630 Anorexia: Secondary | ICD-10-CM

## 2021-10-25 DIAGNOSIS — C661 Malignant neoplasm of right ureter: Secondary | ICD-10-CM

## 2021-10-25 DIAGNOSIS — Z515 Encounter for palliative care: Secondary | ICD-10-CM

## 2021-10-25 NOTE — Progress Notes (Signed)
? ? ?Manufacturing engineer ?Community Palliative Care Consult Note ?Telephone: [phone]  ?Fax: [phone]  ? ? ?Date of encounter: 10/25/21 ?4:44 PM ?PATIENT NAME: Chad Kamara ?[name] ?[name] [id number]   ?[id number] (home)  ?DOB: 04-06-1961 ?MRN: [id number] ?PRIMARY CARE PROVIDER:    ?[employer] ? ?RESPONSIBLE PARTY:    ?Contact Information   ? ? Name Relation Home Work Mobile  ? Chad [name], Chad [name]   [phone]  ? ?  ? ?I met face to face with patient in facility. Palliative Care was asked to follow this patient by consultation request of  [facility] to address advance care planning and complex medical decision making. This is a follow up visit.                                 ?ASSESSMENT AND PLAN / RECOMMENDATIONS:  ?Symptom Management/Plan: ?1. ACP: DNR; treat what is treatable including hospitalizations if necessary ?  ?2.Anorexia secondary to protein calorie malnutrition related to urothelial cancer, prostate cancer improving ongoing. Current weight 156.1 lbs with 4 lbs weight loss. Continue to monitor daily weights, supplements, supportive measures and courage to eat; will review next labs done at [facility] ?  ?3. Chronic pain reviewed, discussed and educated continue to monitor on pain scale, monitor efficacy vs adverse side effects.Will continue to monitor; encouraged to ask when in pain, Continue to follow by psychotherapy and psychiatry for counseling; supportive services. Continue with Oxycodone 85m q6 prn ?  ?4. Palliative care encounter; Palliative medicine team will continue to support patient, patient's family, and medical team. Visit consisted of counseling and education dealing with the complex and emotionally intense issues of symptom management and palliative care in the setting of serious and potentially life-threatening illness ? ?Follow up Palliative Care Visit: Palliative care will continue to follow for complex  medical decision making, advance care planning, and clarification of goals. Return 8 weeks or prn. ? ?I spent 43 minutes providing this consultation starting at 11:30 am. More than 50% of the time in this consultation was spent in counseling and care coordination. ? ?PPS: 50% ? ?Chief Complaint: Follow up palliative consult for complex medical decision making  ? ?HISTORY OF PRESENT ILLNESS:  [name] a 61y.o. year old male  with multiple medical problems including   late onset CVA, urothelial cancer, prostate cancer, chronic hepatitis C, hyperlipidemia, colon surgery, protein calorie malnutrition, anxiety, depression. [facility]. Mr. LVallejotransfers himself to w/c, requires assistance for ADL's. Mr. LOsmunfeeds himself with fair to declined appetite. No recent falls, wounds, hospitalizations. I visited and observed Mr. LMaher Mr LWilliswas sitting in the w/c, just came in from the smoking patio. We talked about how he has been feeling. We talked about symptoms, ros, medical goals. We talked about smoking cessation which he declined. [place]. We talked about coping strategies. We talked about his concerns, support provided. We talked about last visit with Oncology. Mr LCocciaendorses he wants to further discuss before he received more iron infusions. We talked about purpose. Mr. LAmorinverbalized understanding. We talked about role PC in poc. Updated staff. No changes.  ? ?History obtained from review of EMR, discussion with staff and [name]  ?I reviewed available labs, medications, imaging, studies and related documents from the  EMR.  Records reviewed and summarized above.  ? ?ROS ?10 point system reviewed all negative except HPI ? ?Exam ?Constitutional: NAD ?General: frail appearing, pleasant male ?EYES: anicteric sclera, lids intact, no discharge  ?ENMT:  intact hearing, oral mucous membranes moist, dentition intact ?CV: S1S2, RRR ?Pulmonary: LCTA, no increased work of breathing, no cough, room air ?Abdomen: normo-active BS + 4 quadrants, soft and non tender, no ascites ?GU: deferred ?MSK: w/d dependent ?Skin: warm and dry ?Neuro:  left hemiplegia,  no cognitive impairment ?Psych: non-anxious affect, A and O x 3 ?Thank you for the opportunity to participate in the care of Mr. Cuffe.  The palliative care team will continue to follow. Please call our office at [phone] if we can be of additional assistance.  ? ?Kamya [place] Z Arch Methot, NP   ?

## 2021-10-31 ENCOUNTER — Ambulatory Visit: Payer: Medicaid Other

## 2021-11-06 ENCOUNTER — Other Ambulatory Visit: Payer: Self-pay

## 2021-11-06 DIAGNOSIS — C661 Malignant neoplasm of right ureter: Secondary | ICD-10-CM

## 2021-11-07 ENCOUNTER — Inpatient Hospital Stay (HOSPITAL_BASED_OUTPATIENT_CLINIC_OR_DEPARTMENT_OTHER): Payer: Medicaid Other | Admitting: Internal Medicine

## 2021-11-07 ENCOUNTER — Inpatient Hospital Stay: Payer: Medicaid Other

## 2021-11-07 ENCOUNTER — Inpatient Hospital Stay: Payer: Medicaid Other | Attending: Internal Medicine

## 2021-11-07 ENCOUNTER — Encounter: Payer: Self-pay | Admitting: Internal Medicine

## 2021-11-07 DIAGNOSIS — C661 Malignant neoplasm of right ureter: Secondary | ICD-10-CM

## 2021-11-07 DIAGNOSIS — Z79899 Other long term (current) drug therapy: Secondary | ICD-10-CM | POA: Diagnosis not present

## 2021-11-07 DIAGNOSIS — C187 Malignant neoplasm of sigmoid colon: Secondary | ICD-10-CM | POA: Diagnosis not present

## 2021-11-07 DIAGNOSIS — Z7189 Other specified counseling: Secondary | ICD-10-CM

## 2021-11-07 DIAGNOSIS — D509 Iron deficiency anemia, unspecified: Secondary | ICD-10-CM

## 2021-11-07 DIAGNOSIS — D500 Iron deficiency anemia secondary to blood loss (chronic): Secondary | ICD-10-CM

## 2021-11-07 DIAGNOSIS — Z5112 Encounter for antineoplastic immunotherapy: Secondary | ICD-10-CM | POA: Insufficient documentation

## 2021-11-07 DIAGNOSIS — Z515 Encounter for palliative care: Secondary | ICD-10-CM

## 2021-11-07 LAB — CBC WITH DIFFERENTIAL/PLATELET
Abs Immature Granulocytes: 0.03 10*3/uL (ref 0.00–0.07)
Basophils Absolute: 0.1 10*3/uL (ref 0.0–0.1)
Basophils Relative: 1 %
Eosinophils Absolute: 0.3 10*3/uL (ref 0.0–0.5)
Eosinophils Relative: 5 %
HCT: 38.2 % — ABNORMAL LOW (ref 39.0–52.0)
Hemoglobin: 11.9 g/dL — ABNORMAL LOW (ref 13.0–17.0)
Immature Granulocytes: 1 %
Lymphocytes Relative: 16 %
Lymphs Abs: 1 10*3/uL (ref 0.7–4.0)
MCH: 25.3 pg — ABNORMAL LOW (ref 26.0–34.0)
MCHC: 31.2 g/dL (ref 30.0–36.0)
MCV: 81.3 fL (ref 80.0–100.0)
Monocytes Absolute: 0.5 10*3/uL (ref 0.1–1.0)
Monocytes Relative: 7 %
Neutro Abs: 4.7 10*3/uL (ref 1.7–7.7)
Neutrophils Relative %: 70 %
Platelets: 180 10*3/uL (ref 150–400)
RBC: 4.7 MIL/uL (ref 4.22–5.81)
RDW: 17.3 % — ABNORMAL HIGH (ref 11.5–15.5)
WBC: 6.6 10*3/uL (ref 4.0–10.5)
nRBC: 0 % (ref 0.0–0.2)

## 2021-11-07 LAB — COMPREHENSIVE METABOLIC PANEL
ALT: 16 U/L (ref 0–44)
AST: 35 U/L (ref 15–41)
Albumin: 3.2 g/dL — ABNORMAL LOW (ref 3.5–5.0)
Alkaline Phosphatase: 98 U/L (ref 38–126)
Anion gap: 6 (ref 5–15)
BUN: 16 mg/dL (ref 8–23)
CO2: 25 mmol/L (ref 22–32)
Calcium: 8.3 mg/dL — ABNORMAL LOW (ref 8.9–10.3)
Chloride: 104 mmol/L (ref 98–111)
Creatinine, Ser: 0.83 mg/dL (ref 0.61–1.24)
GFR, Estimated: >60 mL/min (ref >60)
Glucose, Bld: 153 mg/dL — ABNORMAL HIGH (ref 70–99)
Potassium: 3.6 mmol/L (ref 3.5–5.1)
Sodium: 135 mmol/L (ref 135–145)
Total Bilirubin: 0.4 mg/dL (ref 0.3–1.2)
Total Protein: 6.9 g/dL (ref 6.5–8.1)

## 2021-11-07 LAB — TSH: TSH: 1.188 u[IU]/mL (ref 0.350–4.500)

## 2021-11-07 MED ORDER — HEPARIN SOD (PORK) LOCK FLUSH 100 UNIT/ML IV SOLN
500.0000 [IU] | Freq: Once | INTRAVENOUS | Status: AC | PRN
  Administered 2021-11-07: 500 [IU]
  Filled 2021-11-07: qty 5

## 2021-11-07 MED ORDER — IRON SUCROSE 20 MG/ML IV SOLN
200.0000 mg | Freq: Once | INTRAVENOUS | Status: AC
  Administered 2021-11-07: 200 mg via INTRAVENOUS
  Filled 2021-11-07: qty 10

## 2021-11-07 MED ORDER — SODIUM CHLORIDE 0.9 % IV SOLN
Freq: Once | INTRAVENOUS | Status: AC
  Filled 2021-11-07: qty 250

## 2021-11-07 MED ORDER — SODIUM CHLORIDE 0.9 % IV SOLN
200.0000 mg | Freq: Once | INTRAVENOUS | Status: AC
  Administered 2021-11-07: 200 mg via INTRAVENOUS
  Filled 2021-11-07: qty 200

## 2021-11-07 NOTE — Patient Instructions (Signed)
[facility]  Discharge Instructions: °Thank you for choosing [facility] to provide your oncology and hematology care.  °If you have a lab appointment with the Cancer Center, please go directly to the Cancer Center and check in at the registration area. ° °Wear comfortable clothing and clothing appropriate for easy access to any Portacath or PICC line.  ° °We strive to give you quality time with your provider. You may need to reschedule your appointment if you arrive late (15 or more minutes).  Arriving late affects you and other patients whose appointments are after yours.  Also, if you miss three or more appointments without notifying the office, you may be dismissed from the clinic at the provider’s discretion.    °  °For prescription refill requests, have your pharmacy contact our office and allow 72 hours for refills to be completed.   ° °Today you received the following chemotherapy and/or immunotherapy agents Keytruda °    °  °To help prevent nausea and vomiting after your treatment, we encourage you to take your nausea medication as directed. ° °BELOW ARE SYMPTOMS THAT SHOULD BE REPORTED IMMEDIATELY: °*FEVER GREATER THAN 100.4 F (38 °C) OR HIGHER °*CHILLS OR SWEATING °*NAUSEA AND VOMITING THAT IS NOT CONTROLLED WITH YOUR NAUSEA MEDICATION °*UNUSUAL SHORTNESS OF BREATH °*UNUSUAL BRUISING OR BLEEDING °*URINARY PROBLEMS (pain or burning when urinating, or frequent urination) °*BOWEL PROBLEMS (unusual diarrhea, constipation, pain near the anus) °TENDERNESS IN MOUTH AND THROAT WITH OR WITHOUT PRESENCE OF ULCERS (sore throat, sores in mouth, or a toothache) °UNUSUAL RASH, SWELLING OR PAIN  °UNUSUAL VAGINAL DISCHARGE OR ITCHING  ° °Items with * indicate a potential emergency and should be followed up as soon as possible or go to the Emergency Department if any problems should occur. ° °Please show the CHEMOTHERAPY ALERT CARD or IMMUNOTHERAPY ALERT CARD at check-in to  the Emergency Department and triage nurse. ° °Should you have questions after your visit or need to cancel or reschedule your appointment, please contact [facility]  [phone] and follow the prompts.  Office hours are 8:00 a.m. to 4:30 p.m. Monday - Friday. Please note that voicemails left after 4:00 p.m. may not be returned until the following business day.  We are closed weekends and major holidays. You have access to a nurse at all times for urgent questions. Please call the main number to the clinic [phone] and follow the prompts. ° °For any non-urgent questions, you may also contact your provider using MyChart. We now offer e-Visits for anyone 18 and older to request care online for non-urgent symptoms. For details visit [email or web address]. °  °Also download the MyChart app! Go to the app store, search "MyChart", open the app, select [facility], and log in with your MyChart username and password. ° °Due to Covid, a mask is required upon entering the hospital/clinic. If you do not have a mask, one will be given to you upon arrival. For doctor visits, patients may have 1 support person aged 18 or older with them. For treatment visits, patients cannot have anyone with them due to current Covid guidelines and our immunocompromised population.  °

## 2021-11-07 NOTE — Progress Notes (Signed)
Patient denies new problems/concerns today.   °

## 2021-11-07 NOTE — Progress Notes (Signed)
I connected with [name] on 11/07/21 at 10:15 AM EDT by video enabled telemedicine visit and verified that I am speaking with the correct person using two identifiers.  ?I discussed the limitations, risks, security and privacy concerns of performing an evaluation and management service by telemedicine and the availability of in-person appointments. I also discussed with the patient that there may be a patient responsible charge related to this service. The patient expressed understanding and agreed to proceed.  ? ? ?Other persons participating in the visit and their role in the encounter: RN/medical reconciliation ?Patient?s location: office ?Provider?s location: home ? ?Oncology History Overview Note  ?# SEP-OCT 2019-right renal pelvis/ ureteral [cytology positive HIGH grade urothelial carcinoma [Dr.Brandon]   ? ?# NOV 24th 2019-Keytruda [consent] ? 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 ?#Right ureteral obstruction status post stent placement ? ?# JAN 2019- Right Colon ca [ T4N1]  [[facility]]; NO adjuvant therapy ? ?# Hep C/ # stroke of left side/weakness-2018 Nov [[place]]; active smoker ? 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 ?GOALS: palliative ? ?CURRENT/MOST RECENT THERAPY: Keytruda [C] ? ?  ?Urothelial cancer ([place])  ? Initial Diagnosis  ? Urothelial cancer ([place]) ?  ?Ureteral cancer, right ([place])  ?05/26/2018 Initial Diagnosis  ? Ureteral cancer, right ([place]) ?  ?06/21/2018 -  Chemotherapy  ? Patient is on Treatment Plan : urothelial cancer- pembrolizumab q21d  ?   ? ? ? ?Chief Complaint: Ureteral cancer/colon cancer ? ? ?History of present illness:Chad [name] 61 y.o.  male with history of stage IV-ureteral cancer/history of stage III colon cancer right side; recurrent sigmoid colon cancer [un-resected/under  surveillance] and multiple other comorbidities currently on Keytruda is here for follow-up.  ?  ?Denies any worsening abdominal pain.  Any nausea vomiting or constipation.  No blood in stools or black-colored stools.  No hematuria.  Chronic shortness of breath. ?  ? ?Observation/objective: Alert & oriented x 3. In No acute distress.  ? ?Assessment and plan: ?Ureteral cancer, right ([place]) ?# High-grade urothelial cancer/cytology; likely of the right renal pelvis /upper ureter. On keytruda- FEB 2023- Stable appearance of substantial low-density filling defect in the right renal collecting system, infundibular, and calices as well ?as the right proximal ureter, favoring either tumor or blood products. Right renal atrophy. Very poor excretion from the right ?kidney despite presence of the ureteral stent, which has proximal embedded within the filling defect in the right renal pelvis.  No obvious progression of disease noted on imaging. ? ?# Proceed with  [place] today; Labs today reviewed; Labs today reviewed;  acceptable for treatment today. STABLE.  ? 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  FEB 2023- CT scan- Focal wall thickening in the proximal sigmoid colon corresponding to the location of prior hypermetabolic activity.  ? ?# Iron deficiency anemia-hemoglobin 11-12; FEB 2023- iron studies-7/ferritin-18 LOW; proceed with venofer ? ?# Bilateral LE ulcers-s/p wound care evaluation; s/p PTCA with Dr.Dew. bil Arterial Dopp-wnl-STABLE.  ? ? #DISPOSITION: ?#  Keytruda today; venofer today ?# follow up in 3 weeks-;MD labs-cbc/cmp;  Keytruda;possible venfoer;- Dr.B.  ? ? ?Follow-up instructions: ? ?I discussed the assessment and treatment plan with the patient.  The patient was provided an opportunity to ask questions and all were answered.  The patient agreed with the plan and  demonstrated understanding of  instructions. ? ?The patient was advised to call back or seek an in person evaluation if the symptoms worsen or if the condition fails to improve as anticipated. ? ?Dr. Charlaine Dalton ?[facility] ?11/07/2021 ?1:51 PM ?

## 2021-11-07 NOTE — Assessment & Plan Note (Addendum)
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 On keytruda- FEB 2023- Stable appearance of substantial low-density filling defect in the right renal collecting system, infundibular, and calices as well ?as the right proximal ureter, favoring either tumor or blood products. Right renal atrophy. Very poor excretion from the right ?kidney despite presence of the ureteral stent, which has proximal embedded within the filling defect in the right renal pelvis.  No obvious progression of disease noted on imaging. ? ?# Proceed with  [place] today; Labs today reviewed; Labs today reviewed;  acceptable for treatment today. STABLE.  ? 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  FEB 2023- CT scan- Focal wall thickening in the proximal sigmoid colon corresponding to the location of prior hypermetabolic activity.  ? ?# Iron deficiency anemia-hemoglobin 11-12; FEB 2023- iron studies-7/ferritin-18 LOW; proceed with venofer ? ?# Bilateral LE ulcers-s/p wound care evaluation; s/p PTCA with Dr.Dew. bil Arterial Dopp-wnl-STABLE.  ? ? #DISPOSITION: ?#  Keytruda today; venofer today ?# follow up in 3 weeks-;MD labs-cbc/cmp;  Keytruda;possible venfoer;- Dr.B.  ? ? ?

## 2021-11-28 ENCOUNTER — Inpatient Hospital Stay: Payer: Medicaid Other | Attending: Internal Medicine

## 2021-11-28 ENCOUNTER — Inpatient Hospital Stay (HOSPITAL_BASED_OUTPATIENT_CLINIC_OR_DEPARTMENT_OTHER): Payer: Medicaid Other | Admitting: Internal Medicine

## 2021-11-28 ENCOUNTER — Encounter: Payer: Self-pay | Admitting: Internal Medicine

## 2021-11-28 ENCOUNTER — Inpatient Hospital Stay: Payer: Medicaid Other

## 2021-11-28 VITALS — BP 108/81 | HR 82

## 2021-11-28 DIAGNOSIS — D500 Iron deficiency anemia secondary to blood loss (chronic): Secondary | ICD-10-CM

## 2021-11-28 DIAGNOSIS — Z5112 Encounter for antineoplastic immunotherapy: Secondary | ICD-10-CM | POA: Insufficient documentation

## 2021-11-28 DIAGNOSIS — F17210 Nicotine dependence, cigarettes, uncomplicated: Secondary | ICD-10-CM | POA: Insufficient documentation

## 2021-11-28 DIAGNOSIS — N401 Enlarged prostate with lower urinary tract symptoms: Secondary | ICD-10-CM | POA: Insufficient documentation

## 2021-11-28 DIAGNOSIS — D509 Iron deficiency anemia, unspecified: Secondary | ICD-10-CM | POA: Insufficient documentation

## 2021-11-28 DIAGNOSIS — Z7189 Other specified counseling: Secondary | ICD-10-CM

## 2021-11-28 DIAGNOSIS — Z806 Family history of leukemia: Secondary | ICD-10-CM | POA: Insufficient documentation

## 2021-11-28 DIAGNOSIS — C187 Malignant neoplasm of sigmoid colon: Secondary | ICD-10-CM | POA: Diagnosis not present

## 2021-11-28 DIAGNOSIS — Z515 Encounter for palliative care: Secondary | ICD-10-CM

## 2021-11-28 DIAGNOSIS — C661 Malignant neoplasm of right ureter: Secondary | ICD-10-CM | POA: Insufficient documentation

## 2021-11-28 DIAGNOSIS — Z8042 Family history of malignant neoplasm of prostate: Secondary | ICD-10-CM | POA: Diagnosis not present

## 2021-11-28 DIAGNOSIS — Z8051 Family history of malignant neoplasm of kidney: Secondary | ICD-10-CM | POA: Insufficient documentation

## 2021-11-28 LAB — CBC WITH DIFFERENTIAL/PLATELET
Abs Immature Granulocytes: 0.01 10*3/uL (ref 0.00–0.07)
Basophils Absolute: 0.1 10*3/uL (ref 0.0–0.1)
Basophils Relative: 1 %
Eosinophils Absolute: 0.4 10*3/uL (ref 0.0–0.5)
Eosinophils Relative: 5 %
HCT: 40.2 % (ref 39.0–52.0)
Hemoglobin: 12.6 g/dL — ABNORMAL LOW (ref 13.0–17.0)
Immature Granulocytes: 0 %
Lymphocytes Relative: 15 %
Lymphs Abs: 1.1 10*3/uL (ref 0.7–4.0)
MCH: 25.7 pg — ABNORMAL LOW (ref 26.0–34.0)
MCHC: 31.3 g/dL (ref 30.0–36.0)
MCV: 82 fL (ref 80.0–100.0)
Monocytes Absolute: 0.6 10*3/uL (ref 0.1–1.0)
Monocytes Relative: 8 %
Neutro Abs: 5 10*3/uL (ref 1.7–7.7)
Neutrophils Relative %: 71 %
Platelets: 196 10*3/uL (ref 150–400)
RBC: 4.9 MIL/uL (ref 4.22–5.81)
RDW: 18 % — ABNORMAL HIGH (ref 11.5–15.5)
WBC: 7.2 10*3/uL (ref 4.0–10.5)
nRBC: 0 % (ref 0.0–0.2)

## 2021-11-28 LAB — COMPREHENSIVE METABOLIC PANEL WITH GFR
ALT: 22 U/L (ref 0–44)
AST: 38 U/L (ref 15–41)
Albumin: 3.2 g/dL — ABNORMAL LOW (ref 3.5–5.0)
Alkaline Phosphatase: 111 U/L (ref 38–126)
Anion gap: 5 (ref 5–15)
BUN: 16 mg/dL (ref 8–23)
CO2: 24 mmol/L (ref 22–32)
Calcium: 8.3 mg/dL — ABNORMAL LOW (ref 8.9–10.3)
Chloride: 107 mmol/L (ref 98–111)
Creatinine, Ser: 0.94 mg/dL (ref 0.61–1.24)
GFR, Estimated: >60 mL/min
Glucose, Bld: 95 mg/dL (ref 70–99)
Potassium: 3.9 mmol/L (ref 3.5–5.1)
Sodium: 136 mmol/L (ref 135–145)
Total Bilirubin: 0.5 mg/dL (ref 0.3–1.2)
Total Protein: 7.3 g/dL (ref 6.5–8.1)

## 2021-11-28 MED ORDER — SODIUM CHLORIDE 0.9 % IV SOLN
200.0000 mg | Freq: Once | INTRAVENOUS | Status: AC
  Administered 2021-11-28: 200 mg via INTRAVENOUS
  Filled 2021-11-28: qty 200

## 2021-11-28 MED ORDER — HEPARIN SOD (PORK) LOCK FLUSH 100 UNIT/ML IV SOLN
500.0000 [IU] | Freq: Once | INTRAVENOUS | Status: AC | PRN
  Administered 2021-11-28: 500 [IU]
  Filled 2021-11-28: qty 5

## 2021-11-28 MED ORDER — IRON SUCROSE 20 MG/ML IV SOLN
200.0000 mg | Freq: Once | INTRAVENOUS | Status: AC
  Administered 2021-11-28: 200 mg via INTRAVENOUS
  Filled 2021-11-28: qty 10

## 2021-11-28 MED ORDER — SODIUM CHLORIDE 0.9 % IV SOLN
Freq: Once | INTRAVENOUS | Status: AC
  Filled 2021-11-28: qty 250

## 2021-11-28 MED ORDER — TAMSULOSIN HCL 0.4 MG PO CAPS: 0.4000 mg | ORAL_CAPSULE | Freq: Every day | ORAL | 3 refills | Status: DC

## 2021-11-28 NOTE — Progress Notes (Signed)
Chad [name] ?CONSULT NOTE ? ?Patient Care Team: ?Pcp, No as PCP - General ?Cammie Sickle, MD as Consulting Physician (Internal Medicine) ?Lateef Bellow, MD as Consulting Physician (General Surgery) ? ?CHIEF COMPLAINTS/PURPOSE OF CONSULTATION: Urothelial cancer ? ?#  ?Oncology History Overview Note  ?# SEP-OCT 2019-right renal pelvis/ ureteral [cytology positive HIGH grade urothelial carcinoma [Dr.Brandon]   ? ?# NOV 24th 2019-Keytruda [consent] ? 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 ?#Right ureteral obstruction status post stent placement ? ?# JAN 2019- Right Colon ca [ T4N1]  [[facility]]; NO adjuvant therapy ? ?# Hep C/ # stroke of left side/weakness-2018 Nov [[place]]; active smoker ? 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 ?GOALS: palliative ? ?CURRENT/MOST RECENT THERAPY: Keytruda [C] ? ?  ?Urothelial cancer ([place])  ? Initial Diagnosis  ? Urothelial cancer ([place]) ? ?  ?Ureteral cancer, right ([place])  ?05/26/2018 Initial Diagnosis  ? Ureteral cancer, right ([place]) ? ?  ?06/21/2018 -  Chemotherapy  ? Patient is on Treatment Plan : urothelial cancer- pembrolizumab q21d  ? ?  ?  ? ? ? ?HISTORY OF PRESENTING ILLNESS: Patient is a poor historian.  Is alone/in a wheelchair. ? ?Chad [name] 61 y.o.  male above history of stage IV-ureteral cancer/history of stage III colon cancer right side; recurrent sigmoid colon cancer [un-resected/under surveillance] and multiple other comorbidities currently on Keytruda is here for follow-up. ? ?Patient complains of increased frequency of urination.  Also complains of incomplete emptying poor stream. ? ?Denies any worsening abdominal pain.  Any nausea vomiting or constipation.  No blood in stools or black-colored stools.  No hematuria.  Chronic shortness of breath. ? ?Review of  Systems  ?Constitutional:  Positive for malaise/fatigue. Negative for chills, diaphoresis, fever and weight loss.  ?HENT:  Negative for nosebleeds and sore throat.   ?Eyes:  Negative for double vision.  ?Respiratory:  Positive for cough and shortness of breath. Negative for hemoptysis and wheezing.   ?Cardiovascular:  Negative for chest pain, palpitations and orthopnea.  ?Gastrointestinal:  Positive for constipation. Negative for abdominal pain, blood in stool, diarrhea, heartburn, melena, nausea and vomiting.  ?Genitourinary:  Negative for dysuria, frequency and urgency.  ?Musculoskeletal:  Positive for back pain and joint pain.  ?Skin: Negative.  Negative for itching and rash.  ?Neurological:  Positive for focal weakness. Negative for dizziness, tingling, weakness and headaches.  ?     Chronic left-sided weakness upper than lower extremity.  ?Endo/Heme/Allergies:  Does not bruise/bleed easily.  ?Psychiatric/Behavioral:  Negative for depression. The patient is not nervous/anxious and does not have insomnia.    ? ?MEDICAL HISTORY:  ?Past Medical History:  ?Diagnosis Date  ? Anemia   ? Anxiety   ? ARF (acute respiratory failure) ([place])   ? Bladder cancer ([place])   ? COPD (chronic obstructive pulmonary disease) ([place])   ? Depression   ? Dysphagia   ? Family history of colon cancer   ? Family history of kidney cancer   ? Family history of leukemia   ? Family history of prostate cancer   ? GERD (gastroesophageal reflux disease)   ? Hepatitis   ? chronic hep c  ? Hydronephrosis   ? Hydronephrosis with ureteral stricture   ? Hyperlipidemia   ? Knee pain   ? Left  ? Malignant neoplasm of colon ([place])   ?  Nerve pain   ? Peripheral vascular disease ([place])   ? Prostate cancer ([place])   ? Stroke [facility])   ? Ureteral cancer, right ([place])   ? Urinary frequency   ? Venous hypertension of both lower extremities   ? ? ?SURGICAL HISTORY: ?Past Surgical History:  ?Procedure Laterality Date  ? COLON SURGERY    ? En bloc extended right  hemicolectomy 07/2017  ? COLONOSCOPY WITH PROPOFOL N/A 11/06/2020  ? Procedure: COLONOSCOPY WITH PROPOFOL;  Surgeon: Jonathon Bellows, MD;  Location[facility] ENDOSCOPY;  Service: Gastroenterology;  Laterality: N/A;  ? COLONOSCOPY WITH PROPOFOL N/A 07/31/2021  ? Procedure: COLONOSCOPY WITH PROPOFOL;  Surgeon: Anyelo Bellow, MD;  Location: [facility]COPY;  Service: Endoscopy;  Laterality: N/A;  ? CYSTOSCOPY W/ RETROGRADES Right 08/30/2018  ? Procedure: CYSTOSCOPY WITH RETROGRADE PYELOGRAM;  Surgeon: Hollice Espy, MD;  Location: [facility] ORS;  Service: Urology;  Laterality: Right;  ? CYSTOSCOPY WITH STENT PLACEMENT Right 04/25/2018  ? Procedure: CYSTOSCOPY WITH STENT PLACEMENT;  Surgeon: Hollice Espy, MD;  Location: [facility] ORS;  Service: Urology;  Laterality: Right;  ? CYSTOSCOPY WITH STENT PLACEMENT Right 08/30/2018  ? Procedure: CYSTOSCOPY WITH STENT Exchange;  Surgeon: Hollice Espy, MD;  Location: [facility] ORS;  Service: Urology;  Laterality: Right;  ? CYSTOSCOPY WITH STENT PLACEMENT Right 03/07/2019  ? Procedure: CYSTOSCOPY WITH STENT Exchange;  Surgeon: Hollice Espy, MD;  Location: [facility] ORS;  Service: Urology;  Laterality: Right;  ? CYSTOSCOPY WITH STENT PLACEMENT Right 11/21/2019  ? Procedure: CYSTOSCOPY WITH STENT Exchange;  Surgeon: Hollice Espy, MD;  Location: [facility] ORS;  Service: Urology;  Laterality: Right;  ? LOWER EXTREMITY ANGIOGRAPHY Left 05/23/2019  ? Procedure: LOWER EXTREMITY ANGIOGRAPHY;  Surgeon: Algernon Huxley, MD;  Location: [facility] CV LAB;  Service: Cardiovascular;  Laterality: Left;  ? LOWER EXTREMITY ANGIOGRAPHY Right 05/30/2019  ? Procedure: LOWER EXTREMITY ANGIOGRAPHY;  Surgeon: Algernon Huxley, MD;  Location: [place] [facility] CV LAB;  Service: Cardiovascular;  Laterality: Right;  ? LOWER EXTREMITY ANGIOGRAPHY Right 02/13/2020  ? Procedure: LOWER EXTREMITY ANGIOGRAPHY;  Surgeon: Algernon Huxley, MD;  Location: [name] CV LAB;  Service: Cardiovascular;  Laterality: Right;  ? LOWER EXTREMITY ANGIOGRAPHY  Left 02/20/2020  ? Procedure: LOWER EXTREMITY ANGIOGRAPHY;  Surgeon: Algernon Huxley, MD;  Location: [facility] CV LAB;  Service: Cardiovascular;  Laterality: Left;  ? PORTA CATH INSERTION N/A 02/28/2019  ? Procedure: PORTA CATH INSERTION;  Surgeon: Algernon Huxley, MD;  Location: [facility] LAB;  Service: Cardiovascular;  Laterality: N/A;  ? tumor removed     ? ? ?SOCIAL HISTORY: ?Social History  ? ?Socioeconomic History  ? Marital status: Single  ?  Spouse name: Not on file  ? Number of children: Not on file  ? Years of education: Not on file  ? Highest education level: Not on file  ?Occupational History  ? Not on file  ?Tobacco Use  ? Smoking status: Every Day  ?  Packs/day: 1.00  ?  Types: Cigarettes  ? Smokeless tobacco: Never  ?Vaping Use  ? Vaping Use: Never used  ?Substance and Sexual Activity  ? Alcohol use: Not Currently  ? Drug use: Not Currently  ? Sexual activity: Not Currently  ?Other Topics Concern  ? Not on file  ?Social History Narrative  ?  used to live [place]; moved  To [place]- end of April 2019; in Nursing home; 1pp/day; quit alcohol. 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   ?   ?  Family- dad-  prostate ca [at 35y]; brother- 69 died of prostate cancer; brother- 12- no cancers [[place]]; sonGerald Stabs [[place]];[name]-32y prostate ca [facility] [place]]; daughter- 85 [[place]]; another daughter 105 [[place]/addict]. will refer genetics counseling. Given MSI- abnormal; highly suspicious of Lynch syndrome.  Patient's son [name] aware of high possible lynch syndrome.  ? ?Social Determinants of Health  ? ?Financial Resource Strain: Not on file  ?Food Insecurity: Not on file  ?Transportation Needs: Not on file  ?Physical Activity: Not on file  ?Stress: Not on file  ?Social Connections: Not on file  ?Intimate Partner Violence: Not on file  ? ? ?FAMILY HISTORY: ?Family History  ?Problem Relation Age of Onset  ? Prostate cancer Father 35  ? Cancer Brother 71  ?     unsure type  ? Cancer Paternal Uncle   ?     unsure type  ? Cancer  Maternal Grandmother   ?     unsure type  ? Cancer Paternal Grandmother   ?     unsure type  ? Kidney cancer Paternal Grandfather   ? Cancer Other   ?     unsure types  ? Leukemia Son   ? Cancer Son   ?     other canc

## 2021-11-28 NOTE — Assessment & Plan Note (Addendum)
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 On keytruda-CT scan- FEB 2023- Stable appearance of substantial low-density filling defect in the right renal collecting system, infundibular, and calices as well as the right proximal ureter, favoring either tumor or blood products.  No obvious progression of disease noted on imaging. ? ?# Proceed with  [place] today; Labs today reviewed; Labs today reviewed;  acceptable for treatment today. STABLE. TSH April 2023--WNL.  ? 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  FEB 2023- CT scan- Focal wall thickening in the proximal sigmoid colon corresponding to the location of prior hypermetabolic activity.  I again reviewed with the patient that he is a poor candidate for surgery; s/p evaluation with Dr. Bary Castilla.  ? ?# Iron deficiency anemia-hemoglobin 11-12; FEB 2023- iron studies-7/ferritin-18 LOW; proceed with venofer ? ?# Bilateral LE ulcers-s/p wound care evaluation; s/p PTCA with Dr.Dew. bil Arterial Dopp-wnl-STABLE.  ? ?# Prostatism: Increased frequency of urination; no signs of infection.  Start Flomax 0.4 mg nightly/prescription printed ? ? #DISPOSITION: ?#  Keytruda today; venofer today ?# follow up in 3 weeks-;MD labs-cbc/cmp;  Keytruda- ;- Dr.B.  ? ? ?

## 2021-11-28 NOTE — Patient Instructions (Signed)
[facility]  Discharge Instructions: ?Thank you for choosing [facility] to provide your oncology and hematology care.  ?If you have a lab appointment with the Cancer Center, please go directly to the Cancer Center and check in at the registration area. ? ?Wear comfortable clothing and clothing appropriate for easy access to any Portacath or PICC line.  ? ?We strive to give you quality time with your provider. You may need to reschedule your appointment if you arrive late (15 or more minutes).  Arriving late affects you and other patients whose appointments are after yours.  Also, if you miss three or more appointments without notifying the office, you may be dismissed from the clinic at the provider?s discretion.    ?  ?For prescription refill requests, have your pharmacy contact our office and allow 72 hours for refills to be completed.   ? ?  ?To help prevent nausea and vomiting after your treatment, we encourage you to take your nausea medication as directed. ? ?BELOW ARE SYMPTOMS THAT SHOULD BE REPORTED IMMEDIATELY: ?*FEVER GREATER THAN 100.4 F (38 ?C) OR HIGHER ?*CHILLS OR SWEATING ?*NAUSEA AND VOMITING THAT IS NOT CONTROLLED WITH YOUR NAUSEA MEDICATION ?*UNUSUAL SHORTNESS OF BREATH ?*UNUSUAL BRUISING OR BLEEDING ?*URINARY PROBLEMS (pain or burning when urinating, or frequent urination) ?*BOWEL PROBLEMS (unusual diarrhea, constipation, pain near the anus) ?TENDERNESS IN MOUTH AND THROAT WITH OR WITHOUT PRESENCE OF ULCERS (sore throat, sores in mouth, or a toothache) ?UNUSUAL RASH, SWELLING OR PAIN  ?UNUSUAL VAGINAL DISCHARGE OR ITCHING  ? ?Items with * indicate a potential emergency and should be followed up as soon as possible or go to the Emergency Department if any problems should occur. ? ?Please show the CHEMOTHERAPY ALERT CARD or IMMUNOTHERAPY ALERT CARD at check-in to the Emergency Department and triage nurse. ? ?Should you have questions after your visit  or need to cancel or reschedule your appointment, please contact [facility]  [phone] and follow the prompts.  Office hours are 8:00 a.m. to 4:30 p.m. Monday - Friday. Please note that voicemails left after 4:00 p.m. may not be returned until the following business day.  We are closed weekends and major holidays. You have access to a nurse at all times for urgent questions. Please call the main number to the clinic [phone] and follow the prompts. ? ?For any non-urgent questions, you may also contact your provider using MyChart. We now offer e-Visits for anyone 18 and older to request care online for non-urgent symptoms. For details visit [email or web address]. ?  ?Also download the MyChart app! Go to the app store, search "MyChart", open the app, select [facility], and log in with your MyChart username and password. ? ?Due to Covid, a mask is required upon entering the hospital/clinic. If you do not have a mask, one will be given to you upon arrival. For doctor visits, patients may have 1 support person aged 18 or older with them. For treatment visits, patients cannot have anyone with them due to current Covid guidelines and our immunocompromised population.  ?

## 2021-12-06 ENCOUNTER — Encounter: Payer: Medicaid Other | Attending: Physician Assistant | Admitting: Physician Assistant

## 2021-12-06 ENCOUNTER — Other Ambulatory Visit
Admission: RE | Admit: 2021-12-06 | Discharge: 2021-12-06 | Disposition: A | Discharge status: Home / Self Care | Payer: Medicaid Other | Source: Ambulatory Visit | Attending: Physician Assistant | Admitting: Physician Assistant

## 2021-12-06 DIAGNOSIS — B965 Pseudomonas (aeruginosa) (mallei) (pseudomallei) as the cause of diseases classified elsewhere: Secondary | ICD-10-CM | POA: Diagnosis not present

## 2021-12-06 DIAGNOSIS — L97322 Non-pressure chronic ulcer of left ankle with fat layer exposed: Secondary | ICD-10-CM | POA: Insufficient documentation

## 2021-12-06 DIAGNOSIS — X58XXXA Exposure to other specified factors, initial encounter: Secondary | ICD-10-CM | POA: Diagnosis not present

## 2021-12-06 DIAGNOSIS — I69354 Hemiplegia and hemiparesis following cerebral infarction affecting left non-dominant side: Secondary | ICD-10-CM | POA: Diagnosis not present

## 2021-12-06 DIAGNOSIS — I87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 hypertension (idiopathic) with ulcer and inflammation of left lower extremity: Secondary | ICD-10-CM | POA: Insufficient documentation

## 2021-12-06 DIAGNOSIS — B9561 Methicillin susceptible Staphylococcus aureus infection as the cause of diseases classified elsewhere: Secondary | ICD-10-CM | POA: Diagnosis not present

## 2021-12-06 DIAGNOSIS — S91002A Unspecified open wound, left ankle, initial encounter: Secondary | ICD-10-CM | POA: Insufficient documentation

## 2021-12-06 NOTE — Progress Notes (Signed)
Chad [name] ([id number]) ?Visit Report for 12/06/2021 ?Allergy List Details ?Patient Name: Chad [name], Chad [name] ?Date of Service: 12/06/2021 10:00 AM ?Medical Record Number: [id number] ?Patient Account Number: [id number] ?Date of Birth/Sex: 10-09-1960 (61 y.o. M) ?Treating RN: Donnamarie Poag ?Primary Care Chyane Greer: [name] Other Clinician: ?Referring Mayuri Staples: [name] ?Treating Kye Hedden/Extender: Jeri Cos ?Weeks in Treatment: 0 ?Allergies ?Active Allergies ?penicillin ?Reaction: rash ?Severity: Moderate ?Allergy Notes ?Electronic Signature(s) ?Signed: 12/06/2021 3:46:47 PM By: Donnamarie Poag ?Entered ByDonnamarie Poag on 12/06/2021 09:54:04 ?STEWARD, SAMES ([id number]) ?-------------------------------------------------------------------------------- ?Arrival Information Details ?Patient Name: Chad [name] ?Date of Service: 12/06/2021 10:00 AM ?Medical Record Number: [id number] ?Patient Account Number: [id number] ?Date of Birth/Sex: 06/18/1961 (61 y.o. M) ?Treating RN: Donnamarie Poag ?Primary Care Uma Jerde: [name] Other Clinician: ?Referring Tegh Franek: [name] ?Treating Sharlize Hoar/Extender: Jeri Cos ?Weeks in Treatment: 0 ?Visit Information ?Patient Arrived: Wheel Chair ?Arrival Time: 09:49 ?Accompanied By: self ?Transfer Assistance: EasyPivot Patient Lift ?Patient Identification Verified: Yes ?Secondary Verification Process Completed: Yes ?Patient Requires Transmission-Based No ?Precautions: ?Patient Has Alerts: Yes ?Patient Alerts: Patient on Blood Thinner ?NOT diabetic ?aspirin '81mg'$  ?Lives [facility] ?History Since Last Visit ?Electronic Signature(s) ?Signed: 12/06/2021 3:46:47 PM By: Donnamarie Poag ?Entered ByDonnamarie Poag on 12/06/2021 09:52:04 ?JERICHO, ALCORN ([id number]) ?-------------------------------------------------------------------------------- ?Clinic Level of Care Assessment Details ?Patient Name: Chad [name] ?Date of Service: 12/06/2021 10:00 AM ?Medical Record Number:  [id number] ?Patient Account Number: [id number] ?Date of Birth/Sex: [birth year] (61 y.o. M) ?Treating RN: Donnamarie Poag ?Primary Care Makiyah Zentz: [name] Other Clinician: ?Referring Shatasia Cutshaw: [name] ?Treating Tamirah George/Extender: Jeri Cos ?Weeks in Treatment: 0 ?Clinic Level of Care Assessment Items ?TOOL 2 Quantity Score ?'[]'$  - Use when only an EandM is performed on the INITIAL visit 0 ?ASSESSMENTS - Nursing Assessment / Reassessment ?X - General Physical Exam (combine w/ comprehensive assessment (listed just below) when performed on new ?1 20 ?pt. evals) ?X- 1 25 ?Comprehensive Assessment (HX, ROS, Risk Assessments, Wounds Hx, etc.) ?ASSESSMENTS - Wound and Skin Assessment / Reassessment ?'[]'$  - Simple Wound Assessment / Reassessment - one wound 0 ?X- 2 5 ?Complex Wound Assessment / Reassessment - multiple wounds ?'[]'$  - 0 ?Dermatologic / Skin Assessment (not related to wound area) ?ASSESSMENTS - Ostomy and/or Continence Assessment and Care ?'[]'$  - Incontinence Assessment and Management 0 ?'[]'$  - 0 ?Ostomy Care Assessment and Management (repouching, etc.) ?PROCESS - Coordination of Care ?X - Simple Patient / Family Education for ongoing care 1 15 ?'[]'$  - 0 ?Complex (extensive) Patient / Family Education for ongoing care ?X- 1 10 ?Staff obtains Consents, Records, Test Results / Process Orders ?X- 1 10 ?Staff telephones HHA, Nursing Homes / Clarify orders / etc ?'[]'$  - 0 ?Routine Transfer to another Facility (non-emergent condition) ?'[]'$  - 0 ?Routine Hospital Admission (non-emergent condition) ?X- 1 15 ?New Admissions / Biomedical engineer / Ordering NPWT, Apligraf, etc. ?'[]'$  - 0 ?Emergency Hospital Admission (emergent condition) ?X- 1 10 ?Simple Discharge Coordination ?'[]'$  - 0 ?Complex (extensive) Discharge Coordination ?PROCESS - Special Needs ?'[]'$  - Pediatric / Minor Patient Management 0 ?'[]'$  - 0 ?Isolation Patient Management ?'[]'$  - 0 ?Hearing / Language / Visual special needs ?'[]'$  - 0 ?Assessment of Community assistance  (transportation, D/C planning, etc.) ?'[]'$  - 0 ?Additional assistance / Altered mentation ?'[]'$  - 0 ?Support Surface(s) Assessment (bed, cushion, seat, etc.) ?INTERVENTIONS - Wound Cleansing / Measurement ?X - Wound Imaging (photographs - any number of wounds) 1 5 ?'[]'$  - 0 ?Wound Tracing (instead of photographs) ?'[]'$  - 0 ?Simple Wound Measurement - one wound ?X- 2 5 ?Complex Wound  Measurement - multiple wounds ?TUFF, CLABO ([id number]) ?'[]'$  - 0 ?Simple Wound Cleansing - one wound ?X- 2 5 ?Complex Wound Cleansing - multiple wounds ?INTERVENTIONS - Wound Dressings ?X - Small Wound Dressing one or multiple wounds 2 10 ?'[]'$  - 0 ?Medium Wound Dressing one or multiple wounds ?'[]'$  - 0 ?Large Wound Dressing one or multiple wounds ?'[]'$  - 0 ?Application of Medications - injection ?INTERVENTIONS - Miscellaneous ?'[]'$  - External ear exam 0 ?X- 1 5 ?Specimen Collection (cultures, biopsies, blood, body fluids, etc.) ?X- 1 5 ?Specimen(s) / Culture(s) sent or taken to Lab for analysis ?'[]'$  - 0 ?Patient Transfer (multiple staff / Civil Service fast streamer / Similar devices) ?'[]'$  - 0 ?Simple Staple / Suture removal (25 or less) ?'[]'$  - 0 ?Complex Staple / Suture removal (26 or more) ?'[]'$  - 0 ?Hypo / Hyperglycemic Management (close monitor of Blood Glucose) ?X- 1 15 ?Ankle / Brachial Index (ABI) - do not check if billed separately ?Has the patient been seen at the hospital within the last three years: Yes ?Total Score: 185 ?Level Of Care: New/Established - Level ?5 ?Electronic Signature(s) ?Signed: 12/06/2021 3:46:47 PM By: Donnamarie Poag ?Entered ByDonnamarie Poag on 12/06/2021 10:46:27 ?KAEDYN, POLIVKA ([id number]) ?-------------------------------------------------------------------------------- ?Encounter Discharge Information Details ?Patient Name: Chad [name] ?Date of Service: 12/06/2021 10:00 AM ?Medical Record Number: [id number] ?Patient Account Number: [id number] ?Date of Birth/Sex: 1960/10/21 (61 y.o. M) ?Treating RN: Donnamarie Poag ?Primary Care Niklas Chretien:  [name] Other Clinician: ?Referring Byard Carranza: [name] ?Treating Derika Eckles/Extender: Jeri Cos ?Weeks in Treatment: 0 ?Encounter Discharge Information Items ?Discharge Condition: Stable ?Ambulatory Status: Wheelchair ?Discharge Destination: [place] ?Telephoned: No ?Orders Sent: Yes ?Transportation: Other ?Accompanied By: self ?Schedule Follow-up Appointment: Yes ?Clinical Summary of Care: ?Electronic Signature(s) ?Signed: 12/06/2021 3:46:47 PM By: Donnamarie Poag ?Entered ByDonnamarie Poag on 12/06/2021 10:47:08 ?JEVANTE, HOLLIBAUGH ([id number]) ?-------------------------------------------------------------------------------- ?Lower Extremity Assessment Details ?Patient Name: AURON, TADROS ?Date of Service: 12/06/2021 10:00 AM ?Medical Record Number: [id number] ?Patient Account Number: [id number] ?Date of Birth/Sex: 10-25-1960 (61 y.o. M) ?Treating RN: Donnamarie Poag ?Primary Care Miguelangel Korn: [name] Other Clinician: ?Referring Brandelyn Henne: [name] ?Treating Kynzleigh Bandel/Extender: Jeri Cos ?Weeks in Treatment: 0 ?Edema Assessment ?Assessed: [Left: Yes] [Right: No] ?Edema: [Left: N] [Right: o] ?Calf ?Left: Right: ?Point of Measurement: 31 cm From Medial Instep 33.5 cm ?Ankle ?Left: Right: ?Point of Measurement: 11 cm From Medial Instep 23 cm ?Knee To Floor ?Left: Right: ?From Medial Instep 41 cm ?Vascular Assessment ?Blood Pressure: ?Brachial: [Left:112] ?Ankle: ?[Left:Dorsalis Pedis: 110 0.98] ?Electronic Signature(s) ?Signed: 12/06/2021 3:46:47 PM By: Donnamarie Poag ?Entered ByDonnamarie Poag on 12/06/2021 10:14:28 ?ARTEM, BUNTE ([id number]) ?-------------------------------------------------------------------------------- ?Multi Wound Chart Details ?Patient Name: SEWARD, CORAN ?Date of Service: 12/06/2021 10:00 AM ?Medical Record Number: [id number] ?Patient Account Number: [id number] ?Date of Birth/Sex: [birth year] (61 y.o. M) ?Treating RN: Donnamarie Poag ?Primary Care Apryle Stowell: [name] Other  Clinician: ?Referring Deval Mroczka: [name] ?Treating Cassady Stanczak/Extender: Jeri Cos ?Weeks in Treatment: 0 ?Vital Signs ?Height(in): 67 ?Pulse(bpm): 73 ?Weight(lbs): 150 ?Blood Pressure(mmHg): 112/73 ?Body Mass Index(BMI)

## 2021-12-06 NOTE — Progress Notes (Signed)
LEDARRIUS, BEAUCHAINE ([id number]) ?Visit Report for 12/06/2021 ?Abuse Risk Screen Details ?Patient Name: Chad [name], Chad [name] ?Date of Service: 12/06/2021 10:00 AM ?Medical Record Number: [id number] ?Patient Account Number: [id number] ?Date of Birth/Sex: August 12, 1960 (61 y.o. M) ?Treating RN: Donnamarie Poag ?Primary Care Alekzander Cardell: [name] Other Clinician: ?Referring Sheranda Seabrooks: [name] ?Treating Kerston Landeck/Extender: Jeri Cos ?Weeks in Treatment: 0 ?Abuse Risk Screen Items ?Answer ?ABUSE RISK SCREEN: ?Has anyone close to you tried to hurt or harm you recentlyo No ?Do you feel uncomfortable with anyone in your familyo No ?Has anyone forced you do things that you didnot want to doo No ?Electronic Signature(s) ?Signed: 12/06/2021 3:46:47 PM By: Donnamarie Poag ?Entered ByDonnamarie Poag on 12/06/2021 09:56:55 ?TAVI, HOOGENDOORN ([id number]) ?-------------------------------------------------------------------------------- ?Activities of Daily Living Details ?Patient Name: Chad [name], Chad [name] ?Date of Service: 12/06/2021 10:00 AM ?Medical Record Number: [id number] ?Patient Account Number: [id number] ?Date of Birth/Sex: [birth year] (61 y.o. M) ?Treating RN: Donnamarie Poag ?Primary Care Vollie Brunty: [name] Other Clinician: ?Referring Shaydon Lease: [name] ?Treating Shakyla Nolley/Extender: Jeri Cos ?Weeks in Treatment: 0 ?Activities of Daily Living Items ?Answer ?Activities of Daily Living (Please select one for each item) ?Drive Automobile Not Able ?Take Medications Need Assistance ?Use Telephone Completely Able ?Care for Appearance Need Assistance ?Use Toilet Need Assistance ?Bath / Shower Need Assistance ?Dress Self Need Assistance ?Feed Self Completely Able ?Walk Not Able ?Get In / Out Bed Need Assistance ?Housework Not Able ?Prepare Meals Not Able ?Handle Money Need Assistance ?Shop for Self Not Able ?Electronic Signature(s) ?Signed: 12/06/2021 3:46:47 PM By: Donnamarie Poag ?Entered ByDonnamarie Poag on 12/06/2021 09:57:25 ?JOANTHONY, HAMZA  ([id number]) ?-------------------------------------------------------------------------------- ?Education Screening Details ?Patient Name: Chad [name], Chad [name] ?Date of Service: 12/06/2021 10:00 AM ?Medical Record Number: [id number] ?Patient Account Number: [id number] ?Date of Birth/Sex: Dec 05, 1960 (61 y.o. M) ?Treating RN: Donnamarie Poag ?Primary Care Linetta Regner: [name] Other Clinician: ?Referring Helmer Dull: [name] ?Treating Hydee Fleece/Extender: Jeri Cos ?Weeks in Treatment: 0 ?Primary Learner Assessed: Patient ?Learning Preferences/Education Level/Primary Language ?Learning Preference: Explanation ?Highest Education Level: High School ?Preferred Language: English ?Cognitive Barrier ?Language Barrier: No ?Translator Needed: No ?Memory Deficit: No ?Emotional Barrier: No ?Cultural/Religious Beliefs Affecting Medical Care: No ?Physical Barrier ?Impaired Vision: No ?Impaired Hearing: No ?Decreased Hand dexterity: No ?Knowledge/Comprehension ?Knowledge Level: Medium ?Comprehension Level: Medium ?Ability to understand written instructions: Medium ?Ability to understand verbal instructions: Medium ?Motivation ?Anxiety Level: Calm ?Cooperation: Cooperative ?Education Importance: Acknowledges Need ?Interest in Health Problems: Asks Questions ?Perception: Coherent ?Willingness to Engage in Self-Management ?High ?Activities: ?Readiness to Engage in Self-Management ?High ?Activities: ?Electronic Signature(s) ?Signed: 12/06/2021 3:46:47 PM By: Donnamarie Poag ?Entered ByDonnamarie Poag on 12/06/2021 09:57:50 ?KORTEZ, MURTAGH ([id number]) ?-------------------------------------------------------------------------------- ?Fall Risk Assessment Details ?Patient Name: Chad [name], Chad [name] ?Date of Service: 12/06/2021 10:00 AM ?Medical Record Number: [id number] ?Patient Account Number: [id number] ?Date of Birth/Sex: 02-15-1961 (61 y.o. M) ?Treating RN: Donnamarie Poag ?Primary Care Brantley Naser: [name] Other Clinician: ?Referring Amadeus Oyama: [name] ?Treating Quandra Fedorchak/Extender: Jeri Cos ?Weeks in Treatment: 0 ?Fall Risk Assessment Items ?Have you had 2 or more falls in the last 12 monthso 0 No ?Have you had any fall that resulted in injury in the last 12 monthso 0 No ?FALLS RISK SCREEN ?History of falling - immediate or within 3 months 0 No ?Secondary diagnosis (Do you have 2 or more medical diagnoseso) 15 Yes ?Ambulatory aid ?None/bed rest/wheelchair/nurse 0 Yes ?Crutches/cane/walker 0 No ?Furniture 0 No ?Intravenous therapy Access/Saline/Heparin Lock 0 No ?Gait/Transferring ?Normal/ bed rest/ wheelchair 0 Yes ?Weak (short steps with or without shuffle, stooped but able to lift head while walking, may ?  0 No ?seek support from furniture) ?Impaired (short steps with shuffle, may have difficulty arising from chair, head down, impaired ?0 No ?balance) ?Mental Status ?Oriented to own ability 0 Yes ?Electronic Signature(s) ?Signed: 12/06/2021 3:46:47 PM By: Donnamarie Poag ?Entered ByDonnamarie Poag on 12/06/2021 09:58:04 ?CORRAN, LALONE ([id number]) ?-------------------------------------------------------------------------------- ?Foot Assessment Details ?Patient Name: Chad [name], Chad [name] ?Date of Service: 12/06/2021 10:00 AM ?Medical Record Number: [id number] ?Patient Account Number: [id number] ?Date of Birth/Sex: 01/14/1961 (61 y.o. M) ?Treating RN: Donnamarie Poag ?Primary Care Enas Winchel: [name] Other Clinician: ?Referring Lean Fayson: [name] ?Treating Eaton Folmar/Extender: Jeri Cos ?Weeks in Treatment: 0 ?Foot Assessment Items ?Site Locations ?+ = Sensation present, - = Sensation absent, C = Callus, U = Ulcer ?R = Redness, W = Warmth, M = Maceration, PU = Pre-ulcerative lesion ?F = Fissure, S = Swelling, D = Dryness ?Assessment ?Right: Left: ?Other Deformity: No No ?Prior Foot Ulcer: No No ?Prior Amputation: No No ?Charcot Joint: No No ?Ambulatory Status: Ambulatory With Help ?Assistance Device: Wheelchair ?Gait: ?Electronic Signature(s) ?Signed: 12/06/2021  3:46:47 PM By: Donnamarie Poag ?Entered ByDonnamarie Poag on 12/06/2021 10:01:36 ?DRAE, MITZEL ([id number]) ?-------------------------------------------------------------------------------- ?Nutrition Risk Screening Details ?Patient Name: Chad [name], Chad [name] ?Date of Service: 12/06/2021 10:00 AM ?Medical Record Number: [id number] ?Patient Account Number: [id number] ?Date of Birth/Sex: [birth year] (61 y.o. M) ?Treating RN: Donnamarie Poag ?Primary Care Enoch Moffa: [name] Other Clinician: ?Referring Catalyna Reilly: [name] ?Treating Endya Austin/Extender: Jeri Cos ?Weeks in Treatment: 0 ?Height (in): 67 ?Weight (lbs): 150 ?Body Mass Index (BMI): 23.5 ?Nutrition Risk Screening Items ?Score Screening ?NUTRITION RISK SCREEN: ?I have an illness or condition that made me change the kind and/or amount of food I eat 0 No ?I eat fewer than two meals per day 0 No ?I eat few fruits and vegetables, or milk products 0 No ?I have three or more drinks of beer, liquor or wine almost every day 0 No ?I have tooth or mouth problems that make it hard for me to eat 0 No ?I don't always have enough money to buy the food I need 0 No ?I eat alone most of the time 0 No ?I take three or more different prescribed or over-the-counter drugs a day 1 Yes ?Without wanting to, I have lost or gained 10 pounds in the last six months 0 No ?I am not always physically able to shop, cook and/or feed myself 0 No ?Nutrition Protocols ?Good Risk Protocol ?Moderate Risk Protocol 0 Provide education on nutrition ?High Risk Proctocol ?Risk Level: Good Risk ?Score: 1 ?Electronic Signature(s) ?Signed: 12/06/2021 3:46:47 PM By: Donnamarie Poag ?Entered ByDonnamarie Poag on 12/06/2021 09:58:19 ?

## 2021-12-07 NOTE — Progress Notes (Addendum)
CARMON, SAHLI ([id number]) ?Visit Report for 12/06/2021 ?Chief Complaint Document Details ?Patient Name: Chad [name], [name] ?Date of Service: 12/06/2021 10:00 AM ?Medical Record Number: [id number] ?Patient Account Number: [id number] ?Date of Birth/Sex: 27-Jan-1961 (61 y.o. M) ?Treating RN: Donnamarie Poag ?Primary Care Provider: [name] Other Clinician: ?Referring Provider: [name] ?Treating Provider/Extender: Jeri Cos ?Weeks in Treatment: 0 ?Information Obtained from: Patient ?Chief Complaint ?Left ankle ulcers ?Electronic Signature(s) ?Signed: 12/06/2021 10:28:44 AM By: Worthy Keeler PA-C ?Entered By: Worthy Keeler on 12/06/2021 10:28:44 ?Chad [name], Chad [name] ([id number]) ?-------------------------------------------------------------------------------- ?HPI Details ?Patient Name: Chad [name], Chad [name] ?Date of Service: 12/06/2021 10:00 AM ?Medical Record Number: [id number] ?Patient Account Number: [id number] ?Date of Birth/Sex: 07/01/1961 (61 y.o. M) ?Treating RN: Donnamarie Poag ?Primary Care Provider: [name] Other Clinician: ?Referring Provider: [name] ?Treating Provider/Extender: Jeri Cos ?Weeks in Treatment: 0 ?History of Present Illness ?HPI Description: 10/08/18 on evaluation today patient actually presents to our office for initial evaluation concerning wounds that he has of the ?bilateral lower extremities. He has no history of known diabetes, he does have hepatitis C, urinary tract cancer for which she receives infusions ?not chemotherapy, and the history of the left-sided stroke with residual weakness. He also has bilateral venous stasis. He apparently has been ?homeless currently following discharge from the hospital apparently he has been placed at [facility] which is is a skilled nursing facility ?locally. Nonetheless fortunately he does not show any signs of infection at this time which is good news. In fact several of the wound actually ?appears to be showing some signs of improvement  already in my pinion. There are a couple areas in the left leg in particular there likely gonna ?require some sharp debridement to help clear away some necrotic tissue and help with more sufficient healing. No fevers, chills, nausea, or ?vomiting noted at this time. ?10/15/18 on evaluation today patient actually appears to be doing very well in regard to his bilateral lower extremities. He's been tolerating the ?dressing changes without complication. Fortunately there does not appear to be any evidence of active infection at this time which is great news. ?Overall I'm actually very pleased with how this has progressed in just one visits time. ?Readmission: ?08/14/2020 upon evaluation today patient presents for re-evaluation here in our clinic. He is having issues with his left ankle region as well as his ?right toe and his right heel. He tells me that the toe and heel actually began as a area that was itching that he was scratching and then ?subsequently opened up into wounds. These may have been abscess areas I presume based on what I am seeing currently. With regard to his left ?ankle region he tells me this was a similar type occurrence although he does have venous stasis this very well may be more of a venous leg ulcer ?more than anything. Nonetheless I do believe that the patient would benefit from appropriate and aggressive wound care to try to help get things ?under better control here. He does have history of a stroke on the left side affecting him to some degree there that he is able to stand although he ?does have some residual weakness. Otherwise again the patient does have chronic venous insufficiency as previously noted. His arterial studies ?most recently obtained showed that he had an ABI on the right of 1.16 with a TBI of 0.52 and on the left and ABI of 1.14 with a TBI of 0.81. That ?was obtained on 06/19/2020. ?08/28/2020 upon evaluation today patient appears to be doing  decently well in regard to his  wounds in general. He has been tolerating the dressing ?changes without complication. Fortunately there does not appear to be any signs of active infection which is great news. With that being said I ?think the [facility] is doing a good job I would recommend that we likely continue with that currently. ?09/11/2020 upon evaluation today patient's wounds did not appear to be doing too poorly but again he is not really showing signs of significant ?improvement with regard to any of the wounds on the right. None of them have Hydrofera Blue on them I am not exactly sure why this is not ?being followed as the facility did not contact us to let us know of any issues with obtaining dressings or otherwise. With that being said he is ?supposed to be using Hydrofera Blue on both of the wounds on the right foot as well as the ankle wound on the left side. ?09/18/2020 upon evaluation today patient appears to be doing poorly with regard to his wounds. Again right now the left ankle in particular ?showed signs of extreme maceration. Apparently he was told by someone with staff at [facility] they could not get the Hydrofera ?Blue. With that being said this is something that is never been relayed to [place] one way or another. Also the patient subsequently has not supposed ?to have a border gauze dressing on. He should have an ABD pad and roll gauze to secure as this drains much too much just to have a border ?gauze dressing to cover. Nonetheless the fact that they are not using the appropriate dressing is directly causing deterioration of the left ankle ?wound it is significantly worse today compared to what it was previous. I did attempt to call [facility] while the patient was here I ?called three times and got no one to even pick up the phone. After this I had my for an office coordinator call and she was able to finally get ?through and leave a message with the D ON as of dictation of this note which is roughly  about an hour and a half later I still have not been able to ?speak with anyone at the facility. ?09/25/2020 upon evaluation today patient actually showing signs of good improvement which is excellent news. He has been tolerating the dressing ?changes without complication. Fortunately there is no signs of active infection which is great news. No fevers, chills, nausea, vomiting, or ?diarrhea. I do feel like the facility has been doing a much better job at taking care of him as far as the dressings are concerned. However the ?director of nursing never did call me back. ?10/09/2020 upon evaluation today patient appears to be doing well with regard to his wound. The toe ulcer did require some debridement but the ?other 2 areas actually appear to be doing quite well. ?10/19/2020 upon evaluation today patient actually appears to be doing very well in regard to his wounds. In fact the heel does appear to be ?completely healed. The toe is doing better in the medial ankle on the left is also doing better. Overall I think he is headed in the right direction. ?10/26/2020 upon evaluation today patient appears to be doing well with regard to his wound. He is showing signs of improvement which is great ?news and overall I am very pleased with where things stand today. No fevers, chills, nausea, vomiting, or diarrhea. ?11/02/2020 upon evaluation today patient appears to be doing well with regard  to his wounds. He has been tolerating the dressing changes without ?complication overall I am extremely pleased with where things stand today. He in regard to the toe is almost completely healed and the medial ?ankle on the left is doing much better. ?11/09/2020 upon evaluation today patient appears to be doing a little poorly in regard to his left medial ankle ulcer. Fortunately there does not ?appear to be any signs of systemic infection but unfortunately locally he does appear to be infected in fact he has blue-green drainage consistent ?with  Pseudomonas. ?Chad [name], Chad [name] ([id number]) ?11/16/2020 upon evaluation today patient appears to be doing well with regard to his wound. It actually appears to be doing better. I did place him ?on gentamici

## 2021-12-09 ENCOUNTER — Non-Acute Institutional Stay: Payer: Medicaid Other | Admitting: Nurse Practitioner

## 2021-12-09 ENCOUNTER — Encounter: Payer: Self-pay | Admitting: Nurse Practitioner

## 2021-12-09 VITALS — BP 110/72 | HR 82 | Temp 98.3°F | Resp 18 | Wt 151.0 lb

## 2021-12-09 DIAGNOSIS — Z720 Tobacco use: Secondary | ICD-10-CM

## 2021-12-09 DIAGNOSIS — Z515 Encounter for palliative care: Secondary | ICD-10-CM

## 2021-12-09 DIAGNOSIS — R630 Anorexia: Secondary | ICD-10-CM

## 2021-12-09 DIAGNOSIS — G894 Chronic pain syndrome: Secondary | ICD-10-CM

## 2021-12-09 DIAGNOSIS — C661 Malignant neoplasm of right ureter: Secondary | ICD-10-CM

## 2021-12-09 NOTE — Progress Notes (Signed)
? ? ?Manufacturing engineer ?Community Palliative Care Consult Note ?Telephone: [phone]  ?Fax: [phone]  ? ? ?Date of encounter: 12/09/21 ?7:38 PM ?PATIENT NAME: Chad [name] ?Chad [name] ?[place] [address]   ?[phone] (home)  ?DOB: Jun 20, 1961 ?MRN: [id number] ?PRIMARY CARE PROVIDER:    ?[employer] ? ?RESPONSIBLE PARTY:    ?Contact Information   ? ? Name Relation Home Work Mobile  ? Chad, [name]   [phone]  ? ?  ? ?I met face to face with patient in facility. Palliative Care was asked to follow this patient by consultation request of  [place] to address advance care planning and complex medical decision making. This is a follow up visit.                                  ?ASSESSMENT AND PLAN / RECOMMENDATIONS: ?Symptom Management/Plan: ?1. ACP: DNR; treat what is treatable including hospitalizations if necessary ?  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 Current weight 151 with about 10 lbs loss. Continue to monitor daily weights, supplements, supportive measures and courage to eat;  ?  ?3. Chronic pain reviewed, discussed and educated continue to monitor on pain scale, monitor efficacy vs adverse side effects.Will continue to monitor; encouraged to ask when in pain, Continue to follow by psychotherapy and psychiatry for counseling; supportive services. Continue with Oxycodone 62m q6 prn ? ?4. Smoking cessation, attempted to discussed, declined.  ?  ?5. Palliative care encounter; Palliative medicine team will continue to support patient, patient's family, and medical team. Visit consisted of counseling and education dealing with the complex and emotionally intense issues of symptom management and palliative care in the setting of serious and potentially life-threatening illness ?Follow up Palliative Care Visit: Palliative care will continue to follow for complex medical decision  making, advance care planning, and clarification of goals. Return 8 weeks or prn. ?I spent 47 minutes providing this consultation starting at 1:30pm. More than 50% of the time in this consultation was spent in counseling and care coordination. ?PPS: 50% ?Chief Complaint: Follow up palliative consult for complex medical decision making ?HISTORY OF PRESENT ILLNESS:  Chad Pellowis a 61y.o. year old male  with multiple medical problems including   late onset CVA, urothelial cancer, prostate cancer, chronic hepatitis C, hyperlipidemia, colon surgery, protein calorie malnutrition, anxiety, depression. [facility]. Mr. LDucetransfers himself to w/c, requires assistance for ADL's. Mr. LWhitten[place]feeds himself with fair to declined appetite. No recent falls, wounds, hospitalizations. Mr. LEppsrequested to see PC to talk about recent visit with Oncology, plan with treatment. Mr LBastoswas sitting in the w/c, in his room. Mr. LFouchetalked about moving to new room, he likes better. We talked about how he has been feeling. We talked about symptoms, ros, medical goals. [place] We talked about coping strategies. We talked about smoking cessation which he declined. Mr. LBlanchardendorses that is what brings him joy. Medical goals reviewed. Mr. LPardonverbalized understanding. We talked about role PC in poc. Updated staff. No changes.  ?  ?History obtained from review of EMR, discussion with staff and Chad [name]  ?I reviewed available labs, medications, imaging, studies and related documents from the EMR.  Records reviewed and summarized above.  ?  ?  ROS ?10 point system reviewed all negative except HPI ?  ?Exam ?Constitutional: NAD ?General: frail appearing, pleasant male ?EYES: anicteric sclera, lids intact, no discharge  ?ENMT: intact hearing, oral mucous membranes moist, dentition  intact ?CV: S1S2, RRR ?Pulmonary: LCTA, no increased work of breathing, no cough, room air ?Abdomen: normo-active BS + 4 quadrants, soft and non tender, no ascites ?GU: deferred ?MSK: w/d dependent ?Skin: warm and dry ?Neuro:  left hemiplegia,  no cognitive impairment ?Psych: non-anxious affect, A and O x 3 ?Thank you for the opportunity to participate in the care of Chad [name].  The palliative care team will continue to follow. Please call our office at [phone] if we can be of additional assistance.  ? ?Dajon Lazar Z Atthew Coutant, NP  ?  ?

## 2021-12-11 LAB — AEROBIC CULTURE W GRAM STAIN (SUPERFICIAL SPECIMEN): Gram Stain: NONE SEEN

## 2021-12-19 ENCOUNTER — Inpatient Hospital Stay: Payer: Medicaid Other

## 2021-12-19 ENCOUNTER — Encounter: Payer: Self-pay | Admitting: Internal Medicine

## 2021-12-19 ENCOUNTER — Inpatient Hospital Stay (HOSPITAL_BASED_OUTPATIENT_CLINIC_OR_DEPARTMENT_OTHER): Payer: Medicaid Other | Admitting: Internal Medicine

## 2021-12-19 DIAGNOSIS — Z5112 Encounter for antineoplastic immunotherapy: Secondary | ICD-10-CM | POA: Diagnosis not present

## 2021-12-19 DIAGNOSIS — C187 Malignant neoplasm of sigmoid colon: Secondary | ICD-10-CM

## 2021-12-19 DIAGNOSIS — C661 Malignant neoplasm of right ureter: Secondary | ICD-10-CM

## 2021-12-19 DIAGNOSIS — Z7189 Other specified counseling: Secondary | ICD-10-CM

## 2021-12-19 DIAGNOSIS — Z515 Encounter for palliative care: Secondary | ICD-10-CM

## 2021-12-19 LAB — COMPREHENSIVE METABOLIC PANEL
ALT: 18 U/L (ref 0–44)
AST: 32 U/L (ref 15–41)
Albumin: 3.7 g/dL (ref 3.5–5.0)
Alkaline Phosphatase: 106 U/L (ref 38–126)
Anion gap: 8 (ref 5–15)
BUN: 21 mg/dL (ref 8–23)
CO2: 23 mmol/L (ref 22–32)
Calcium: 8.8 mg/dL — ABNORMAL LOW (ref 8.9–10.3)
Chloride: 105 mmol/L (ref 98–111)
Creatinine, Ser: 1.13 mg/dL (ref 0.61–1.24)
GFR, Estimated: >60 mL/min (ref >60)
Glucose, Bld: 117 mg/dL — ABNORMAL HIGH (ref 70–99)
Potassium: 4.1 mmol/L (ref 3.5–5.1)
Sodium: 136 mmol/L (ref 135–145)
Total Bilirubin: 0.6 mg/dL (ref 0.3–1.2)
Total Protein: 7.8 g/dL (ref 6.5–8.1)

## 2021-12-19 LAB — CBC WITH DIFFERENTIAL/PLATELET
Abs Immature Granulocytes: 0.03 10*3/uL (ref 0.00–0.07)
Basophils Absolute: 0.1 10*3/uL (ref 0.0–0.1)
Basophils Relative: 1 %
Eosinophils Absolute: 0.3 10*3/uL (ref 0.0–0.5)
Eosinophils Relative: 4 %
HCT: 41.7 % (ref 39.0–52.0)
Hemoglobin: 13.3 g/dL (ref 13.0–17.0)
Immature Granulocytes: 0 %
Lymphocytes Relative: 18 %
Lymphs Abs: 1.3 10*3/uL (ref 0.7–4.0)
MCH: 26.2 pg (ref 26.0–34.0)
MCHC: 31.9 g/dL (ref 30.0–36.0)
MCV: 82.1 fL (ref 80.0–100.0)
Monocytes Absolute: 0.5 10*3/uL (ref 0.1–1.0)
Monocytes Relative: 8 %
Neutro Abs: 5 10*3/uL (ref 1.7–7.7)
Neutrophils Relative %: 69 %
Platelets: 192 10*3/uL (ref 150–400)
RBC: 5.08 MIL/uL (ref 4.22–5.81)
RDW: 18.8 % — ABNORMAL HIGH (ref 11.5–15.5)
WBC: 7.2 10*3/uL (ref 4.0–10.5)
nRBC: 0 % (ref 0.0–0.2)

## 2021-12-19 MED ORDER — SODIUM CHLORIDE 0.9 % IV SOLN
200.0000 mg | Freq: Once | INTRAVENOUS | Status: AC
  Administered 2021-12-19: 200 mg via INTRAVENOUS
  Filled 2021-12-19: qty 8

## 2021-12-19 MED ORDER — HEPARIN SOD (PORK) LOCK FLUSH 100 UNIT/ML IV SOLN
500.0000 [IU] | Freq: Once | INTRAVENOUS | Status: AC | PRN
  Administered 2021-12-19: 500 [IU]
  Filled 2021-12-19: qty 5

## 2021-12-19 MED ORDER — SODIUM CHLORIDE 0.9 % IV SOLN
Freq: Once | INTRAVENOUS | Status: AC
  Filled 2021-12-19: qty 250

## 2021-12-19 MED ORDER — HEPARIN SOD (PORK) LOCK FLUSH 100 UNIT/ML IV SOLN
INTRAVENOUS | Status: AC
  Filled 2021-12-19: qty 5

## 2021-12-19 NOTE — Patient Instructions (Signed)
[facility]  Discharge Instructions: °Thank you for choosing [facility] to provide your oncology and hematology care.  °If you have a lab appointment with the Cancer Center, please go directly to the Cancer Center and check in at the registration area. ° °Wear comfortable clothing and clothing appropriate for easy access to any Portacath or PICC line.  ° °We strive to give you quality time with your provider. You may need to reschedule your appointment if you arrive late (15 or more minutes).  Arriving late affects you and other patients whose appointments are after yours.  Also, if you miss three or more appointments without notifying the office, you may be dismissed from the clinic at the provider’s discretion.    °  °For prescription refill requests, have your pharmacy contact our office and allow 72 hours for refills to be completed.   ° °Today you received the following chemotherapy and/or immunotherapy agents Keytruda °    °  °To help prevent nausea and vomiting after your treatment, we encourage you to take your nausea medication as directed. ° °BELOW ARE SYMPTOMS THAT SHOULD BE REPORTED IMMEDIATELY: °*FEVER GREATER THAN 100.4 F (38 °C) OR HIGHER °*CHILLS OR SWEATING °*NAUSEA AND VOMITING THAT IS NOT CONTROLLED WITH YOUR NAUSEA MEDICATION °*UNUSUAL SHORTNESS OF BREATH °*UNUSUAL BRUISING OR BLEEDING °*URINARY PROBLEMS (pain or burning when urinating, or frequent urination) °*BOWEL PROBLEMS (unusual diarrhea, constipation, pain near the anus) °TENDERNESS IN MOUTH AND THROAT WITH OR WITHOUT PRESENCE OF ULCERS (sore throat, sores in mouth, or a toothache) °UNUSUAL RASH, SWELLING OR PAIN  °UNUSUAL VAGINAL DISCHARGE OR ITCHING  ° °Items with * indicate a potential emergency and should be followed up as soon as possible or go to the Emergency Department if any problems should occur. ° °Please show the CHEMOTHERAPY ALERT CARD or IMMUNOTHERAPY ALERT CARD at check-in to  the Emergency Department and triage nurse. ° °Should you have questions after your visit or need to cancel or reschedule your appointment, please contact [facility]  [phone] and follow the prompts.  Office hours are 8:00 a.m. to 4:30 p.m. Monday - Friday. Please note that voicemails left after 4:00 p.m. may not be returned until the following business day.  We are closed weekends and major holidays. You have access to a nurse at all times for urgent questions. Please call the main number to the clinic [phone] and follow the prompts. ° °For any non-urgent questions, you may also contact your provider using MyChart. We now offer e-Visits for anyone 18 and older to request care online for non-urgent symptoms. For details visit [email or web address]. °  °Also download the MyChart app! Go to the app store, search "MyChart", open the app, select [facility], and log in with your MyChart username and password. ° °Due to Covid, a mask is required upon entering the hospital/clinic. If you do not have a mask, one will be given to you upon arrival. For doctor visits, patients may have 1 support person aged 18 or older with them. For treatment visits, patients cannot have anyone with them due to current Covid guidelines and our immunocompromised population.  °

## 2021-12-19 NOTE — Assessment & Plan Note (Addendum)
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 On keytruda-CT scan- FEB 2023- Stable appearance of substantial low-density filling defect in the right renal collecting system, infundibular, and calices as well as the right proximal ureter, favoring either tumor or blood products.  No obvious progression of disease noted on imaging.  # Proceed with  [place] today; Labs today reviewed; Labs today reviewed;  acceptable for treatment today. STABLE. TSH April 2023--WNL.  Again we will plan imaging in June.  Will order at next visit.   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  FEB 2023- CT scan- Focal wall thickening in the proximal sigmoid colon corresponding to the location of prior hypermetabolic activity.  I again reviewed with the patient that he is a poor candidate for surgery; s/p evaluation with Dr. Bary Castilla. STABLE.  We will again repeat imaging at next visit in June.  # Iron deficiency anemia-hemoglobin 11-12; FEB 2023- iron studies-7/ferritin-18 LOW; proceed with venofer  # Bilateral LE ulcers-s/p wound care evaluation; s/p PTCA with Dr.Dew. bil Arterial Dopp-wnl-STABLE.   # Prostatism:on  Flomax 0.4 mg qhs -better. Continue  # Hypocalcemia- Mild- on Ca+vit D; check 25-OH vit D levels.   #DISPOSITION: #  Keytruda today;  # follow up in 3 weeks-;MD labs-cbc/cmp;25-OH vit D levels.  Keytruda- ;- Dr.B.

## 2021-12-19 NOTE — Progress Notes (Signed)
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  Is alone/in a wheelchair.  Chad [name] 61 y.o.  male above history of stage IV-ureteral cancer/history of stage III colon cancer right side; recurrent sigmoid colon cancer [un-resected/under surveillance] and multiple other comorbidities currently on Keytruda is here for follow-up.  Since starting on Flomax;  increased frequency of urination improved.  Denies any worsening abdominal pain.  Any nausea vomiting or constipation.  No blood in stools or black-colored stools.  No hematuria.  Chronic shortness of breath.  Review of Systems   Constitutional:  Positive for malaise/fatigue. Negative for chills, diaphoresis, fever and weight loss.  HENT:  Negative for nosebleeds and sore throat.   Eyes:  Negative for double vision.  Respiratory:  Positive for cough and shortness of breath. Negative for hemoptysis and wheezing.   Cardiovascular:  Negative for chest pain, palpitations and orthopnea.  Gastrointestinal:  Positive for constipation. Negative for abdominal pain, blood in stool, diarrhea, heartburn, melena, nausea and vomiting.  Genitourinary:  Negative for dysuria, frequency and urgency.  Musculoskeletal:  Positive for back pain and joint pain.  Skin: Negative.  Negative for itching and rash.  Neurological:  Positive for focal weakness. Negative for dizziness, tingling, weakness and headaches.       Chronic left-sided weakness upper than lower extremity.  Endo/Heme/Allergies:  Does not bruise/bleed easily.  Psychiatric/Behavioral:  Negative for depression. The patient is not nervous/anxious and does not have insomnia.     MEDICAL HISTORY:  Past Medical History:  Diagnosis Date   Anemia    Anxiety    ARF (acute respiratory failure) (HCC)    Bladder cancer (HCC)    COPD (chronic obstructive pulmonary disease) ([place])    Depression    Dysphagia    Family history of colon cancer    Family history of kidney cancer    Family history of leukemia    Family history of prostate cancer    GERD (gastroesophageal reflux disease)    Hepatitis    chronic hep c   Hydronephrosis    Hydronephrosis with ureteral stricture    Hyperlipidemia    Knee pain    Left   Malignant neoplasm of colon (HCC)    Nerve pain  Peripheral vascular disease ([place])    Prostate cancer ([place])    Stroke [facility])    Ureteral cancer, right ([place])    Urinary frequency    Venous hypertension of both lower extremities     SURGICAL HISTORY: Past Surgical History:  Procedure Laterality Date   COLON SURGERY     En bloc extended right hemicolectomy  07/2017   COLONOSCOPY WITH PROPOFOL N/A 11/06/2020   Procedure: COLONOSCOPY WITH PROPOFOL;  Surgeon: Jonathon Bellows, MD;  Location[facility] ENDOSCOPY;  Service: Gastroenterology;  Laterality: N/A;   COLONOSCOPY WITH PROPOFOL N/A 07/31/2021   Procedure: COLONOSCOPY WITH PROPOFOL;  Surgeon: Worthy Bellow, MD;  Location: [facility] ENDOSCOPY;  Service: Endoscopy;  Laterality: N/A;   CYSTOSCOPY W/ RETROGRADES Right 08/30/2018   Procedure: CYSTOSCOPY WITH RETROGRADE PYELOGRAM;  Surgeon: Hollice Espy, MD;  Location: [facility] ORS;  Service: Urology;  Laterality: Right;   CYSTOSCOPY WITH STENT PLACEMENT Right 04/25/2018   Procedure: CYSTOSCOPY WITH STENT PLACEMENT;  Surgeon: Hollice Espy, MD;  Location: [facility] ORS;  Service: Urology;  Laterality: Right;   CYSTOSCOPY WITH STENT PLACEMENT Right 08/30/2018   Procedure: [place];  Surgeon: Hollice Espy, MD;  Location: [facility] ORS;  Service: Urology;  Laterality: Right;   CYSTOSCOPY WITH STENT PLACEMENT Right 03/07/2019   Procedure: CYSTOSCOPY WITH STENT Exchange;  Surgeon: Hollice Espy, MD;  Location: [facility] ORS;  Service: Urology;  Laterality: Right;   CYSTOSCOPY WITH STENT PLACEMENT Right 11/21/2019   Procedure: CYSTOSCOPY WITH STENT Exchange;  Surgeon: Hollice Espy, MD;  Location: [facility] ORS;  Service: Urology;  Laterality: Right;   LOWER EXTREMITY ANGIOGRAPHY Left 05/23/2019   Procedure: LOWER EXTREMITY ANGIOGRAPHY;  Surgeon: Algernon Huxley, MD;  Location: [place] CV LAB;  Service: Cardiovascular;  Laterality: Left;   LOWER EXTREMITY ANGIOGRAPHY Right 05/30/2019   Procedure: LOWER EXTREMITY ANGIOGRAPHY;  Surgeon: Algernon Huxley, MD;  Location: [place] CV LAB;  Service: Cardiovascular;  Laterality: Right;   LOWER EXTREMITY ANGIOGRAPHY Right 02/13/2020   Procedure: LOWER EXTREMITY ANGIOGRAPHY;  Surgeon: Algernon Huxley, MD;  Location: [place] CV LAB;  Service: Cardiovascular;  Laterality: Right;   LOWER EXTREMITY ANGIOGRAPHY Left 02/20/2020    Procedure: LOWER EXTREMITY ANGIOGRAPHY;  Surgeon: Algernon Huxley, MD;  Location: [place] CV LAB;  Service: Cardiovascular;  Laterality: Left;   PORTA CATH INSERTION N/A 02/28/2019   Procedure: PORTA CATH INSERTION;  Surgeon: Algernon Huxley, MD;  Location: [place] CV LAB;  Service: Cardiovascular;  Laterality: N/A;   tumor removed       SOCIAL HISTORY: Social History   Socioeconomic History   Marital status: Single    Spouse name: Not on file   Number of children: Not on file   Years of education: Not on file   Highest education level: Not on file  Occupational History   Not on file  Tobacco Use   Smoking status: Every Day    Packs/day: 1.00    Types: Cigarettes   Smokeless tobacco: Never  Vaping Use   Vaping Use: Never used  Substance and Sexual Activity   Alcohol use: Not Currently   Drug use: Not Currently   Sexual activity: Not Currently  Other Topics Concern   Not on file  Social History Narrative    used to live [place]; moved  To [place]- end of April 2019; in Nursing home; 1pp/day; quit alcohol. 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        Family- dad- prostate ca [at 49y]; brother-  41 died of prostate cancer; brother- 61- no cancers [[place]]; sonGerald Stabs [[place]];[name]-32y prostate ca [facility] [place]]; daughter- 7 [[place]]; another daughter 47 [[place]/addict]. will refer genetics counseling. Given MSI- abnormal; highly suspicious of Lynch syndrome.  Patient's son [name] aware of high possible lynch syndrome.   Social Determinants of Health   Financial Resource Strain: Not on file  Food Insecurity: Not on file  Transportation Needs: Not on file  Physical Activity: Not on file  Stress: Not on file  Social Connections: Not on file  Intimate Partner Violence: Not on file    FAMILY HISTORY: Family History  Problem Relation Age of Onset   Prostate cancer Father 64   Cancer Brother 28       unsure type   Cancer Paternal Uncle        unsure type   Cancer Maternal Grandmother         unsure type   Cancer Paternal Grandmother        unsure type   Kidney cancer Paternal Grandfather    Cancer Other        unsure types   Leukemia Son    Cancer Son        other cancers, possibly colon    ALLERGIES:  is allergic to penicillins.  MEDICATIONS:  Current Outpatient Medications  Medication Sig Dispense Refill   acetaminophen (TYLENOL) 325 MG tablet Take 650 mg by mouth 3 (three) times daily.      aspirin EC 81 MG tablet Take 1 tablet (81 mg total) by mouth daily. 150 tablet 2   clopidogrel (PLAVIX) 75 MG tablet Take 1 tablet (75 mg total) by mouth daily. 30 tablet 11   cyclobenzaprine (FLEXERIL) 10 MG tablet Take 10 mg by mouth at bedtime.     escitalopram (LEXAPRO) 5 MG tablet Take 5 mg by mouth daily.     gabapentin (NEURONTIN) 100 MG capsule Take 100 mg by mouth 3 (three) times daily.     gentamicin ointment (GARAMYCIN) 0.1 % Apply 1 application. topically 3 (three) times daily.     mirtazapine (REMERON) 7.5 MG tablet Take 7.5 mg by mouth at bedtime.     Oxycodone HCl 10 MG TABS Take 10 mg by mouth every 6 (six) hours as needed for pain.     pantoprazole (PROTONIX) 40 MG tablet Take 40 mg by mouth daily.     senna-docusate (SENOKOT-S) 8.6-50 MG tablet Take 1 tablet by mouth daily.     sulfamethoxazole-trimethoprim (BACTRIM DS) 800-160 MG tablet Take 1 tablet by mouth 2 (two) times daily.     tamsulosin (FLOMAX) 0.4 MG CAPS capsule Take 1 capsule (0.4 mg total) by mouth daily after supper. 30 capsule 3   atorvastatin (LIPITOR) 10 MG tablet Take 1 tablet (10 mg total) by mouth daily. 30 tablet 11   carboxymethylcellulose 1 % ophthalmic solution Apply 1 drop to eye 2 (two) times daily. (Patient not taking: Reported on 12/19/2021)     No current facility-administered medications for this visit.      Marland Kitchen  PHYSICAL EXAMINATION: ECOG PERFORMANCE STATUS: 1 - Symptomatic but completely ambulatory  Vitals:   12/19/21 0914  BP: 108/81  Pulse: 86  Temp: (!) 96.8 F  (36 C)  SpO2: 97%   Filed Weights   12/19/21 0914  Weight: 149 lb (67.6 kg)     Physical Exam HENT:     Head: Normocephalic and atraumatic.     Mouth/Throat:     Pharynx: No oropharyngeal exudate.  Eyes:     Pupils: Pupils are equal, round, and reactive to light.  Cardiovascular:     Rate and Rhythm: Normal rate and regular rhythm.  Pulmonary:     Effort: No respiratory distress.     Breath sounds: No wheezing.  Abdominal:     General: Bowel sounds are normal. There is no distension.     Palpations: Abdomen is soft. There is no mass.     Tenderness: There is no abdominal tenderness. There is no guarding or rebound.  Musculoskeletal:        General: No tenderness. Normal range of motion.     Cervical back: Normal range of motion and neck supple.  Skin:    General: Skin is warm.     Comments: Bilateral lower extremity ulcerations noted.  Pulses intact bilaterally.  Cystlike lesion noted right elbow-nontender.  No erythema noted.  Neurological:     Mental Status: He is oriented to person, place, and time.     Comments: Sleepy but easily arousable.  Chronic weakness left upper and lower extremity.  Psychiatric:        Mood and Affect: Affect normal.     LABORATORY DATA:  I have reviewed the data as listed Lab Results  Component Value Date   WBC 7.2 12/19/2021   HGB 13.3 12/19/2021   HCT 41.7 12/19/2021   MCV 82.1 12/19/2021   PLT 192 12/19/2021   Recent Labs    11/07/21 0947 11/28/21 0831 12/19/21 0852  NA 135 136 136  K 3.6 3.9 4.1  CL 104 107 105  CO2 _0 GLUCOSE 153* 95 117*  BUN _1 CREATININE 0.83 0.94 1.13  CALCIUM 8.3* 8.3* 8.8*  GFRNONAA >60 >60 >60  PROT 6.9 7.3 7.8  ALBUMIN 3.2* 3.2* 3.7  AST 35 38 32  ALT _2 ALKPHOS 98 111 106  BILITOT 0.4 0.5 0.6    RADIOGRAPHIC STUDIES: I have personally reviewed the radiological images as listed and agreed with the findings in the report. No results found.  ASSESSMENT & PLAN:    Ureteral cancer, right ([place]) # High-grade urothelial cancer/cytology; likely of the right renal pelvis /upper ureter. On keytruda-CT scan- FEB 2023- Stable appearance of substantial low-density filling defect in the right renal collecting system, infundibular, and calices as well as the right proximal ureter, favoring either tumor or blood products.  No obvious progression of disease noted on imaging.  # Proceed with  [place] today; Labs today reviewed; Labs today reviewed;  acceptable for treatment today. STABLE. TSH April 2023--WNL.  Again we will plan imaging in June.  Will order at next visit.   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  FEB 2023- CT scan- Focal wall thickening in the proximal sigmoid colon corresponding to the location of prior hypermetabolic activity.  I again reviewed with the patient that he is a poor candidate for surgery; s/p evaluation with Dr. Bary Castilla. STABLE.  We will again repeat imaging at next visit in June.  # Iron deficiency anemia-hemoglobin 11-12; FEB 2023- iron studies-7/ferritin-18 LOW; proceed with venofer  # Bilateral LE ulcers-s/p wound care evaluation; s/p PTCA with Dr.Dew. bil Arterial Dopp-wnl-STABLE.   # Prostatism:on  Flomax 0.4 mg qhs -better. Continue  # Hypocalcemia- Mild- on Ca+vit D; check 25-OH vit D levels.   #DISPOSITION: #  Keytruda today;  # follow up in 3 weeks-;MD labs-cbc/cmp;25-OH vit D levels.  Keytruda- ;- Dr.B.     All questions were answered. The patient knows to call the clinic with any problems, questions or concerns.    Cammie Sickle, MD 12/19/2021 9:49 AM

## 2021-12-27 ENCOUNTER — Encounter: Payer: Medicaid Other | Attending: Physician Assistant | Admitting: Physician Assistant

## 2021-12-27 DIAGNOSIS — L97322 Non-pressure chronic ulcer of left ankle with fat layer exposed: Secondary | ICD-10-CM | POA: Insufficient documentation

## 2021-12-27 DIAGNOSIS — I739 Peripheral vascular disease, unspecified: Secondary | ICD-10-CM | POA: Diagnosis not present

## 2021-12-27 DIAGNOSIS — I69354 Hemiplegia and hemiparesis following cerebral infarction affecting left non-dominant side: Secondary | ICD-10-CM | POA: Insufficient documentation

## 2021-12-27 DIAGNOSIS — I87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 hypertension (idiopathic) with ulcer and inflammation of left lower extremity: Secondary | ICD-10-CM | POA: Insufficient documentation

## 2021-12-27 DIAGNOSIS — G629 Polyneuropathy, unspecified: Secondary | ICD-10-CM | POA: Insufficient documentation

## 2021-12-27 DIAGNOSIS — J449 Chronic obstructive pulmonary disease, unspecified: Secondary | ICD-10-CM | POA: Diagnosis not present

## 2021-12-27 DIAGNOSIS — B1920 Unspecified viral hepatitis C without hepatic coma: Secondary | ICD-10-CM | POA: Insufficient documentation

## 2021-12-27 DIAGNOSIS — I2510 Atherosclerotic heart disease of native coronary artery without angina pectoris: Secondary | ICD-10-CM | POA: Insufficient documentation

## 2021-12-27 NOTE — Progress Notes (Addendum)
Chad [name], Chad [name] ([id number]) Visit Report for 12/27/2021 Chief Complaint Document Details Patient Name: Chad [name], Chad [name] Date of Service: 12/27/2021 9:15 AM Medical Record Number: [id number] Patient Account Number: [id number] Date of Birth/Sex: [birth year] (61 y.o. M) Treating RN: [name] Primary Care Provider: [name] Other Clinician: Referring Provider: [name] Treating Provider/Extender: Skipper Cliche in Treatment: 3 Information Obtained from: Patient Chief Complaint Left ankle ulcers Electronic Signature(s) Signed: 12/27/2021 9:06:52 AM By: Worthy Keeler PA-C Entered By: Worthy Keeler on 12/27/2021 09:06:51 Chad [name] ([id number]) -------------------------------------------------------------------------------- Debridement Details Patient Name: Chad [name] Date of Service: 12/27/2021 9:15 AM Medical Record Number: [id number] Patient Account Number: [id number] Date of Birth/Sex: 04-13-1961 (61 y.o. M) Treating RN: [name] Primary Care Provider: [name] Other Clinician: Referring Provider: [name] Treating Provider/Extender: Skipper Cliche in Treatment: 3 Debridement Performed for Wound #11 Left,Proximal,Medial Ankle Assessment: Performed By: Physician Tommie Sams., PA-C Debridement Type: Debridement Severity of Tissue Pre Debridement: Fat layer exposed Level of Consciousness (Pre- Awake and Alert procedure): Pre-procedure Verification/Time Out Yes - 09:58 Taken: Pain Control: Lidocaine 4% Topical Solution Total Area Debrided (L x W): 1.8 (cm) x 1.3 (cm) = 2.34 (cm) Tissue and other material Viable, Non-Viable, Slough, Subcutaneous, Slough debrided: Level: Skin/Subcutaneous Tissue Debridement Description: Excisional Instrument: Curette Bleeding: Minimum Hemostasis Achieved: Pressure Response to Treatment: Procedure was tolerated well Level of Consciousness (Post- Awake and Alert procedure): Post Debridement  Measurements of Total Wound Length: (cm) 1.8 Width: (cm) 1.3 Depth: (cm) 0.1 Volume: (cm) 0.184 Character of Wound/Ulcer Post Debridement: Stable Severity of Tissue Post Debridement: Fat layer exposed Post Procedure Diagnosis Same as Pre-procedure Electronic Signature(s) Signed: 12/27/2021 4:36:57 PM By: Worthy Keeler PA-C Signed: 12/27/2021 4:40:28 PM By: [name] Entered By: [name] on 12/27/2021 09:59:21 Chad [name] ([id number]) -------------------------------------------------------------------------------- Debridement Details Patient Name: Chad [name] Date of Service: 12/27/2021 9:15 AM Medical Record Number: [id number] Patient Account Number: [id number] Date of Birth/Sex: Jul 26, 1961 (61 y.o. M) Treating RN: [name] Primary Care Provider: [name] Other Clinician: Referring Provider: [name] Treating Provider/Extender: Skipper Cliche in Treatment: 3 Debridement Performed for Wound #12 Left,Distal,Medial Ankle Assessment: Performed By: Physician Tommie Sams., PA-C Debridement Type: Debridement Severity of Tissue Pre Debridement: Fat layer exposed Level of Consciousness (Pre- Awake and Alert procedure): Pre-procedure Verification/Time Out Yes - 09:58 Taken: Pain Control: Lidocaine 4% Topical Solution Total Area Debrided (L x W): 3.7 (cm) x 2 (cm) = 7.4 (cm) Tissue and other material Viable, Non-Viable, Slough, Subcutaneous, Slough debrided: Level: Skin/Subcutaneous Tissue Debridement Description: Excisional Instrument: Curette Bleeding: Minimum Hemostasis Achieved: Pressure Response to Treatment: Procedure was tolerated well Level of Consciousness (Post- Awake and Alert procedure): Post Debridement Measurements of Total Wound Length: (cm) 3.7 Width: (cm) 2 Depth: (cm) 0.2 Volume: (cm) 1.162 Character of Wound/Ulcer Post Debridement: Stable Severity of Tissue Post Debridement: Fat layer exposed Post Procedure  Diagnosis Same as Pre-procedure Electronic Signature(s) Signed: 12/27/2021 4:36:57 PM By: Worthy Keeler PA-C Signed: 12/27/2021 4:40:28 PM By: [name] Entered By: [name] on 12/27/2021 09:59:52 Chad [name] ([id number]) -------------------------------------------------------------------------------- HPI Details Patient Name: Chad [name] Date of Service: 12/27/2021 9:15 AM Medical Record Number: [id number] Patient Account Number: [id number] Date of Birth/Sex: August 29, 1960 (61 y.o. M) Treating RN: [name] Primary Care Provider: [name] Other Clinician: Referring Provider: [name] Treating Provider/Extender: Skipper Cliche in Treatment: 3 History of Present Illness HPI Description: 10/08/18 on evaluation today patient actually presents to our office for initial evaluation concerning wounds that he has of  the bilateral lower extremities. 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 He also has bilateral venous stasis. He apparently has been homeless currently following discharge from the hospital apparently he has been placed at [facility] which is is a skilled nursing facility locally. Nonetheless fortunately he does not show any signs of infection at this time which is good news. In fact several of the wound actually appears to be showing some signs of improvement already in my pinion. There are a couple areas in the left leg in particular there likely gonna require some sharp debridement to help clear away some necrotic tissue and help with more sufficient healing. No fevers, chills, nausea, or vomiting noted at this time. 10/15/18 on evaluation today patient actually appears to be doing very well in regard to his bilateral lower extremities. He's been tolerating the dressing changes without  complication. Fortunately there does not appear to be any evidence of active infection at this time which is great news. Overall I'm actually very pleased with how this has progressed in just one visits time. Readmission: 08/14/2020 upon evaluation today patient presents for re-evaluation here in our clinic. He is having issues with his left ankle region as well as his right toe and his right heel. He tells me that the toe and heel actually began as a area that was itching that he was scratching and then subsequently opened up into wounds. These may have been abscess areas I presume based on what I am seeing currently. With regard to his left ankle region he tells me this was a similar type occurrence although he does have venous stasis this very well may be more of a venous leg ulcer more than anything. Nonetheless I do believe that the patient would benefit from appropriate and aggressive wound care to try to help get things under better control here. He does have history of a stroke on the left side affecting him to some degree there that he is able to stand although he does have some residual weakness. Otherwise again the patient does have chronic venous insufficiency as previously noted. His arterial studies most recently obtained showed that he had an ABI on the right of 1.16 with a TBI of 0.52 and on the left and ABI of 1.14 with a TBI of 0.81. That was obtained on 06/19/2020. 08/28/2020 upon evaluation today patient appears to be doing decently well in regard to his wounds in general. He has been tolerating the dressing changes without complication. Fortunately there does not appear to be any signs of active infection which is great news. With that being said I think the [facility] is doing a good job I would recommend that we likely continue with that currently. 09/11/2020 upon evaluation today patient's wounds did not appear to be doing too poorly but again he is not really showing signs of  significant improvement with regard to any of the wounds on the right. None of them have Hydrofera Blue on them I am not exactly sure why this is not being followed as the facility did not contact us to let us know of any issues with obtaining dressings or otherwise. With that being said he is supposed to be using Hydrofera Blue on both of the wounds on the right foot as well as the ankle wound on the left side. 09/18/2020 upon evaluation  today patient appears to be doing poorly with regard to his wounds. Again right now the left ankle in particular showed signs of extreme maceration. Apparently he was told by someone with staff at [facility] they could not get the [facility]. With that being said this is something that is never been relayed to [place] one way or another. Also the patient subsequently has not supposed to have a border gauze dressing on. He should have an ABD pad and roll gauze to secure as this drains much too much just to have a border gauze dressing to cover. Nonetheless the fact that they are not using the appropriate dressing is directly causing deterioration of the left ankle wound it is significantly worse today compared to what it was previous. I did attempt to call [facility] while the patient was here I called three times and got no one to even pick up the phone. After this I had my for an office coordinator call and she was able to finally get through and leave a message with the D ON as of dictation of this note which is roughly about an hour and a half later I still have not been able to speak with anyone at the facility. 09/25/2020 upon evaluation today patient actually showing signs of good improvement which is excellent news. He has been tolerating the dressing changes without complication. Fortunately there is no signs of active infection which is great news. No fevers, chills, nausea, vomiting, or diarrhea. I do feel like the facility has been doing a  much better job at taking care of him as far as the dressings are concerned. However the director of nursing never did call me back. 10/09/2020 upon evaluation today patient appears to be doing well with regard to his wound. The toe ulcer did require some debridement but the other 2 areas actually appear to be doing quite well. 10/19/2020 upon evaluation today patient actually appears to be doing very well in regard to his wounds. In fact the heel does appear to be completely healed. The toe is doing better in the medial ankle on the left is also doing better. Overall I think he is headed in the right direction. 10/26/2020 upon evaluation today patient appears to be doing well with regard to his wound. He is showing signs of improvement which is great news and overall I am very pleased with where things stand today. No fevers, chills, nausea, vomiting, or diarrhea. 11/02/2020 upon evaluation today patient appears to be doing well with regard to his wounds. He has been tolerating the dressing changes without complication overall I am extremely pleased with where things stand today. He in regard to the toe is almost completely healed and the medial ankle on the left is doing much better. 11/09/2020 upon evaluation today patient appears to be doing a little poorly in regard to his left medial ankle ulcer. Fortunately there does not appear to be any signs of systemic infection but unfortunately locally he does appear to be infected in fact he has blue-green drainage consistent with Pseudomonas. Chad [name], Chad [name] ([id number]) 11/16/2020 upon evaluation today patient appears to be doing well with regard to his wound. It actually appears to be doing better. I did place him on gentamicin cream since the Cipro was actually resistant even though he was positive for Pseudomonas on culture. Overall I think that he does seem to be doing better though I am unsure whether or not they have actually been putting the cream on.  The patient is not sure that we did talk to the nurse directly and she was going to initiate that treatment. Fortunately there does not appear to be any signs of active infection at this time. No fevers, chills, nausea, vomiting, or diarrhea. 4/28; the area on the right second toe is close to healed. Left medial ankle required debridement 12/07/2020 upon evaluation today patient appears to be doing well with regard to his wounds. In fact the right second toe appears to be completely healed which is great news. Fortunately there does not appear to be any signs of active infection at this time which is also great news. I think we can probably discontinue the gentamicin on top of everything else. 12/14/2020 upon evaluation today patient appears to be doing well with regard to his wound. He is making good progress and overall very pleased with where things stand today. There is no signs of active infection at this time which is great news. 12/28/2020 upon evaluation today patient appears to be doing well with regard to his wounds. He has been tolerating the dressing changes without complication. Fortunately there is no signs of active infection at this time. No fevers, chills, nausea, vomiting, or diarrhea. 12/28/2020 upon evaluation today patient's wound bed actually showed signs of excellent improvement. He has great epithelization and granulation I do not see any signs of infection overall I am extremely pleased with where things stand at this point. No fevers, chills, nausea, vomiting, or diarrhea. 01/11/2021 upon evaluation today patient appears to be doing well with regard to his wound on his leg. He has been tolerating the dressing changes without complication. Fortunately there does not appear to be any signs of active infection which is great news. No fevers, chills, nausea, vomiting, or diarrhea. 01/25/2021 upon evaluation today patient appears to be doing well with regard to his wound. He has been  tolerating the dressing changes without complication. Fortunately the collagen seems to be doing a great job which is excellent news. No fevers, chills, nausea, vomiting, or diarrhea. 02/08/2021 upon evaluation today patient's wound is actually looking a little bit worse especially in the periwound compared to previous. Fortunately there does not appear to be any signs of infection which is great news with that being said he does have some irritation around the periphery of the wound which has me more concerned. He actually had a dressing on that had not been changed in 3 days. He also is supposed to have daily dressing changes. With regard to the dressing applied he had a silver alginate dressing and silver collagen is what is recommended and ordered. He also had no Desitin around the edges of the wound in the periwound region although that is on the order inspect to be done as well. In general I was very concerned I did contact [facility] actually spoke with Magda Paganini who is the scheduling individual and subsequently she stated that she would pass the information to the D ON apparently the D ON was not available to talk to me when I call today. 02/18/2021 upon evaluation today patient's wound is actually showing signs of improvement. Fortunately there does not appear to be any evidence of infection which is great news overall I am extremely pleased with where things stand today. No fevers, chills, nausea, vomiting, or diarrhea. 8/3; patient presents for 1 week follow-up. He has no issues or complaints today. He denies signs of infection. 03/11/2021 upon evaluation today patient appears to be doing well with regard  to his wound. He does have a little bit of slough noted on the surface of the wound but fortunately there does not appear to be any signs of active infection at this time. No fevers, chills, nausea, vomiting, or diarrhea. 03/18/2021 upon evaluation today patient appears to be doing well  with regard to his wound. He has been tolerating the dressing changes without complication. There was a little irritation more proximal to where the wound was that was not noted last week but nonetheless this is very superficial just seems to be more irritation we just need to make sure to put a good amount of the zinc over the area in my opinion. Otherwise he does not seem to be doing significantly worse at all which is great news. 03/25/2021 upon evaluation today patient appears to be doing well with regard to his wound. He is going require some sharp debridement today to clear with some of the necrotic debris. I did perform this today without complication postdebridement wound bed appears to be doing much better this is great news. 04/08/2021 upon evaluation today patient appears to be doing decently well in regard to his wound although the overall measurement is not significantly smaller compared to previous. It is gone down a little bit but still the facility continues to not really put the appropriate dressings in place in fact he was supposed to have collagen we think he probably had more of an allergy to At this point. Fortunately there does not appear to be any signs of active infection systemically though locally I do not see anything on initial visualization either as far as erythema or warmth. 04/15/2021 upon evaluation today patient appears to be doing well with regard to his wound. He is actually showing signs of improvement. I did place him on antibiotics last week, Cipro. He has been taking that 2 times a day and seems to be tolerating it very well. I do not see any evidence of worsening and in fact the overall appearance of the wound is smaller today which is also great news. 9/26; left medial ankle chronic venous insufficiency wound is improved. Using Hydrofera Blue 10/10; left medial ankle chronic venous insufficiency. Wound has not changed much in appearance completely nonviable  surface. Apparently there have been problems getting the right product on the wound at the facility although he came in with [facility] on today 05/14/2021 upon evaluation today patient appears to be doing well with regard to his wound. I think he is making progress here which is good news. Fortunately there does not appear to be any signs of active infection at this time. No fevers, chills, nausea, vomiting, or diarrhea. 05/20/2021 upon evaluation today patient appears to be doing well with regard to his wound. He is showing signs of good improvement which is great news. There does not appear to be any evidence of active infection which is also excellent news. No fevers, chills, nausea, vomiting, or diarrhea. 05/28/2021 upon evaluation today patient appears to be doing quite well. There does not appear to be any signs of active infection at this time which is great news. Overall I am extremely pleased with where things stand today. I think he is headed in the right direction. 06/11/2021 upon evaluation today patient appears to be doing well with regard to his left ankle ulcer and poorly in regard to the toe ulcer on the second toe right foot. This appears to show signs of joint exposure. Apparently this has been present for  1 to 2 months although he kept [facility] [place] ([id number]) forgetting to tell me about it. That is unfortunate as right now it definitely appears to be doing significantly worse than what I would like to see. There does not appear to be any signs of active infection systemically though locally I am concerned about the possibility of infection the toe is quite red. Again no one from the facility ever contacted [place] to advise that this was going on in the interim either. 06/17/2021 upon evaluation today patient presents for follow-up I did review his x-ray which showed a navicular bone fracture I am unsure of the chronicity of this. Subsequently he also had osteomyelitis of the  toe which was what I was more concerned about this did not show up on x-ray but did show up on the pathology scrapings. This was listed as acute osteomyelitis. Nonetheless at this point I think that the antibiotic treatment is the best regimen to go with currently. The patient is in agreement with that plan. Nonetheless he has initially 30 days of doxycycline off likely extend that towards the end of the treatment cycle that will be around the middle of December for an additional 2 weeks. That all depends on how well he continues to heal. Nonetheless based on what I am seeing in the foot I did want a proceed with an MRI as well which I think will be helpful to identify if there is anything else that needs to be addressed from the standpoint of infection. 06/24/2021 upon evaluation today patient appears to be doing pretty well in regards to his wounds. I think both are actually showing signs of improvement which is good I did review his MRI today which did show signs of osteomyelitis of the middle and proximal phalanx on his right foot of the affected toe. With that being said this is actually showing signs of significant improvement today already with the antibiotic therapy I think the redness is also improved. Overall I think that we just need to give this some time with appropriate wound care we will see how things go potentially hyperbarics could be considered. 07/02/2021 upon inspection today patient actually appears to be doing well in regard to his left ankle which is getting very close to complete resolution of pleased in that regard. Unfortunately he is continuing to have issues with his second toe right foot and this seems to still be very painful for him. Recommend he try something different from the standpoint of antibiotics. 07/15/2021 upon evaluation today patient appears to be doing actually pretty well in regard to his foot. This is actually showing signs of significant improvement which  is great news. Overall I feel like the patient is improving both in regard to the second toe as well as the ankle on the left. With that being said the biggest issue that I do see currently is that he is needing to have a refill of the doxycycline that we previously treated him with. He also did see podiatry they are not going to recommend any amputation at this point since he seems to be doing quite well. For that reason we just need to keep things under control from an infection standpoint. 08/01/2021 upon evaluation today patient appears to be doing well with regard to his wound. He has been tolerating the dressing changes without complication. Fortunately there does not appear to be any evidence of active infection locally nor systemically at this point. In fact I think everything is doing  excellent in fact his second toe on the right foot is almost healed and the ankle on the left ankle region is actually very close to being healed as well. 08/08/2021 upon evaluation today patient appears to be doing well with regard to his wound. He has been tolerating the dressing changes without complication. Fortunately I do not see any signs of active infection at this time. Readmission: 12-06-2021 upon evaluation today patient presents for reevaluation here in the clinic he does tell me that he was being seen in facility at [place] by a provider that was coming in. He is not sure who this was. He tells me however that the wound seems to have gotten worse even compared to where it was when we last saw him at this point. With that being said I do believe that he is likely going need ongoing wound care here in the clinic and I do believe that we need to be the ones to frontline this since his wound does seem to be getting worse not better at this point. He voiced understanding. He is also in agreement with this plan and feels more comfortable coming here she tells me. Patient's medical history really  has not changed since his prior admission he was only gone since January. 12-27-2021 upon evaluation today patient appears to be doing well with regard to his wound they did run out of the [facility] so they did not put anything on just an ABD pad with gentamicin. Still we are seeing some signs of good improvement here with some new epithelization which is great news. Electronic Signature(s) Signed: 12/27/2021 10:03:02 AM By: Worthy Keeler PA-C Entered By: Worthy Keeler on 12/27/2021 10:03:02 Chad [name] ([id number]) -------------------------------------------------------------------------------- Physical Exam Details Patient Name: Chad [name] Date of Service: 12/27/2021 9:15 AM Medical Record Number: [id number] Patient Account Number: [id number] Date of Birth/Sex: June 29, 1961 (61 y.o. M) Treating RN: [name] Primary Care Provider: [name] Other Clinician: Referring Provider: [name] Treating Provider/Extender: Jeri Cos Weeks in Treatment: 3 Constitutional Well-nourished and well-hydrated in no acute distress. Respiratory normal breathing without difficulty. Psychiatric this patient is able to make decisions and demonstrates good insight into disease process. Alert and Oriented x 3. pleasant and cooperative. Notes Upon inspection patient's wound bed showed signs of good granulation at causation at this point. Fortunately I do not see any signs of infection locally or systemically which is great news and overall very pleased with where we stand today. Electronic Signature(s) Signed: 12/27/2021 10:03:19 AM By: Worthy Keeler PA-C Entered By: Worthy Keeler on 12/27/2021 10:03:19 Chad [name] ([id number]) -------------------------------------------------------------------------------- Physician Orders Details Patient Name: Chad [name] Date of Service: 12/27/2021 9:15 AM Medical Record Number: [id number] Patient Account Number: [id number] Date of  Birth/Sex: 1961/01/06 (61 y.o. M) Treating RN: [name] Primary Care Provider: [name] Other Clinician: Referring Provider: [name] Treating Provider/Extender: Skipper Cliche in Treatment: 3 Verbal / Phone Orders: No Diagnosis Coding ICD-10 Coding Code Description [id number] Chronic venous hypertension (idiopathic) with ulcer and inflammation of left lower extremity L97.322 Non-pressure chronic ulcer of left ankle with fat layer exposed 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 side Follow-up Appointments o Return Appointment in 2 weeks. Bathing/ Shower/ Hygiene o Wash wounds with antibacterial soap and water. o No tub bath. Wound Treatment Wound #11 - Ankle Wound Laterality: Left, Medial, Proximal Cleanser: Soap and Water 3 x Per Week/30 Days Discharge Instructions: Gently cleanse wound with antibacterial soap, rinse and pat  dry prior to dressing wounds Cleanser: Wound Cleanser 3 x Per Week/30 Days Discharge Instructions: Wash your hands with soap and water. Remove old dressing, discard into plastic bag and place into trash. Cleanse the wound with Wound Cleanser prior to applying a clean dressing using gauze sponges, not tissues or cotton balls. Do not scrub or use excessive force. Pat dry using gauze sponges, not tissue or cotton balls. Topical: Gentamicin 3 x Per Week/30 Days Discharge Instructions: Apply as directed by provider. Primary Dressing: Hydrofera Blue Ready Transfer Foam, 2.5x2.5 (in/in) 3 x Per Week/30 Days Discharge Instructions: Apply Hydrofera Blue Ready to wound bed as directed Secondary Dressing: ABD Pad 5x9 (in/in) 3 x Per Week/30 Days Discharge Instructions: Cover with ABD pad Secured With: Medipore Tape - 74M Medipore H Soft Cloth Surgical Tape, 2x2 (in/yd) 3 x Per Week/30 Days Secured With: [employer] Sterile or Non-Sterile 6-ply 4.5x4 (yd/yd) 3 x Per Week/30 Days Discharge  Instructions: Apply Kerlix as directed Wound #12 - Ankle Wound Laterality: Left, Medial, Distal Cleanser: Soap and Water 3 x Per Week/30 Days Discharge Instructions: Gently cleanse wound with antibacterial soap, rinse and pat dry prior to dressing wounds Cleanser: Wound Cleanser 3 x Per Week/30 Days Discharge Instructions: Wash your hands with soap and water. Remove old dressing, discard into plastic bag and place into trash. Cleanse the wound with Wound Cleanser prior to applying a clean dressing using gauze sponges, not tissues or cotton balls. Do not scrub or use excessive force. Pat dry using gauze sponges, not tissue or cotton balls. Topical: Gentamicin 3 x Per Week/30 Days Discharge Instructions: Apply as directed by provider. Primary Dressing: Hydrofera Blue Ready Transfer Foam, 2.5x2.5 (in/in) 3 x Per Week/30 Days Discharge Instructions: Apply Hydrofera Blue Ready to wound bed as directed Secondary Dressing: ABD Pad 5x9 (in/in) 3 x Per Week/30 Days Discharge Instructions: Cover with ABD pad Chad [name], Chad [name] ([id number]) Secured With: [place] Surgical Tape, 2x2 (in/yd) 3 x Per Week/30 Days Secured With: [employer] Sterile or Non-Sterile 6-ply 4.5x4 (yd/yd) 3 x Per Week/30 Days Discharge Instructions: Apply Kerlix as directed Electronic Signature(s) Signed: 12/27/2021 4:36:57 PM By: Worthy Keeler PA-C Signed: 12/27/2021 4:40:28 PM By: [name] Entered By: [name] on 12/27/2021 10:07:36 Chad [name] ([id number]) -------------------------------------------------------------------------------- Problem List Details Patient Name: Chad [name] Date of Service: 12/27/2021 9:15 AM Medical Record Number: [id number] Patient Account Number: [id number] Date of Birth/Sex: 01-22-1961 (61 y.o. M) Treating RN: [name] Primary Care Provider: [name] Other Clinician: Referring Provider: [name] Treating Provider/Extender: Skipper Cliche in Treatment: 3 Active Problems ICD-10 Encounter Code Description Active Date MDM Diagnosis 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 (idiopathic) with ulcer and inflammation of 12/06/2021 No Yes left lower extremity L97.322 Non-pressure chronic ulcer of left ankle with fat layer exposed 12/06/2021 No Yes I69.354 Hemiplegia and hemiparesis following cerebral infarction affecting left 12/06/2021 No Yes non-dominant side Inactive Problems Resolved Problems Electronic Signature(s) Signed: 12/27/2021 9:06:44 AM By: Worthy Keeler PA-C Entered By: Worthy Keeler on 12/27/2021 09:06:44 Chad [name] ([id number]) -------------------------------------------------------------------------------- Progress Note Details Patient Name: Chad [name] Date of Service: 12/27/2021 9:15 AM Medical Record Number: [id number] Patient Account Number: [id number] Date of Birth/Sex: 03-10-1961 (61 y.o. M) Treating RN: [name] Primary Care Provider: [name] Other Clinician: Referring Provider: [name] Treating Provider/Extender: Skipper Cliche in Treatment: 3 Subjective Chief Complaint Information obtained from Patient Left ankle ulcers History of Present Illness (HPI) 10/08/18 on evaluation today patient actually presents  to our office for initial evaluation concerning wounds that he has of the bilateral lower extremities. 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 He also has bilateral venous stasis. He apparently has been homeless currently following discharge from the hospital apparently he has been placed at [facility] which is is a skilled nursing facility locally. Nonetheless fortunately he does not show any signs of infection at this time which is good news. In fact several of the wound actually appears to be  showing some signs of improvement already in my pinion. There are a couple areas in the left leg in particular there likely gonna require some sharp debridement to help clear away some necrotic tissue and help with more sufficient healing. No fevers, chills, nausea, or vomiting noted at this time. 10/15/18 on evaluation today patient actually appears to be doing very well in regard to his bilateral lower extremities. He's been tolerating the dressing changes without complication. Fortunately there does not appear to be any evidence of active infection at this time which is great news. Overall I'm actually very pleased with how this has progressed in just one visits time. Readmission: 08/14/2020 upon evaluation today patient presents for re-evaluation here in our clinic. He is having issues with his left ankle region as well as his right toe and his right heel. He tells me that the toe and heel actually began as a area that was itching that he was scratching and then subsequently opened up into wounds. These may have been abscess areas I presume based on what I am seeing currently. With regard to his left ankle region he tells me this was a similar type occurrence although he does have venous stasis this very well may be more of a venous leg ulcer more than anything. Nonetheless I do believe that the patient would benefit from appropriate and aggressive wound care to try to help get things under better control here. He does have history of a stroke on the left side affecting him to some degree there that he is able to stand although he does have some residual weakness. Otherwise again the patient does have chronic venous insufficiency as previously noted. His arterial studies most recently obtained showed that he had an ABI on the right of 1.16 with a TBI of 0.52 and on the left and ABI of 1.14 with a TBI of 0.81. That was obtained on 06/19/2020. 08/28/2020 upon evaluation today patient appears to be  doing decently well in regard to his wounds in general. He has been tolerating the dressing changes without complication. Fortunately there does not appear to be any signs of active infection which is great news. With that being said I think the [facility] is doing a good job I would recommend that we likely continue with that currently. 09/11/2020 upon evaluation today patient's wounds did not appear to be doing too poorly but again he is not really showing signs of significant improvement with regard to any of the wounds on the right. None of them have Hydrofera Blue on them I am not exactly sure why this is not being followed as the facility did not contact us to let us know of any issues with obtaining dressings or otherwise. With that being said he is supposed to be using Hydrofera Blue on both of the wounds on the right foot as  well as the ankle wound on the left side. 09/18/2020 upon evaluation today patient appears to be doing poorly with regard to his wounds. Again right now the left ankle in particular showed signs of extreme maceration. Apparently he was told by someone with staff at [place] they could not get the [facility]. With that being said this is something that is never been relayed to [place] one way or another. Also the patient subsequently has not supposed to have a border gauze dressing on. He should have an ABD pad and roll gauze to secure as this drains much too much just to have a border gauze dressing to cover. Nonetheless the fact that they are not using the appropriate dressing is directly causing deterioration of the left ankle wound it is significantly worse today compared to what it was previous. I did attempt to call [facility] while the patient was here I called three times and got no one to even pick up the phone. After this I had my for an office coordinator call and she was able to finally get through and leave a message with the D ON as of  dictation of this note which is roughly about an hour and a half later I still have not been able to speak with anyone at the facility. 09/25/2020 upon evaluation today patient actually showing signs of good improvement which is excellent news. He has been tolerating the dressing changes without complication. Fortunately there is no signs of active infection which is great news. No fevers, chills, nausea, vomiting, or diarrhea. I do feel like the facility has been doing a much better job at taking care of him as far as the dressings are concerned. However the director of nursing never did call me back. 10/09/2020 upon evaluation today patient appears to be doing well with regard to his wound. The toe ulcer did require some debridement but the other 2 areas actually appear to be doing quite well. 10/19/2020 upon evaluation today patient actually appears to be doing very well in regard to his wounds. In fact the heel does appear to be completely healed. The toe is doing better in the medial ankle on the left is also doing better. Overall I think he is headed in the right direction. 10/26/2020 upon evaluation today patient appears to be doing well with regard to his wound. He is showing signs of improvement which is great news and overall I am very pleased with where things stand today. No fevers, chills, nausea, vomiting, or diarrhea. 11/02/2020 upon evaluation today patient appears to be doing well with regard to his wounds. He has been tolerating the dressing changes without complication overall I am extremely pleased with where things stand today. He in regard to the toe is almost completely healed and the medial [facility] [place] ([id number]) ankle on the left is doing much better. 11/09/2020 upon evaluation today patient appears to be doing a little poorly in regard to his left medial ankle ulcer. Fortunately there does not appear to be any signs of systemic infection but unfortunately locally he does  appear to be infected in fact he has blue-green drainage consistent with Pseudomonas. 11/16/2020 upon evaluation today patient appears to be doing well with regard to his wound. It actually appears to be doing better. I did place him on gentamicin cream since the Cipro was actually resistant even though he was positive for Pseudomonas on culture. Overall I think that he does seem to be doing better though I  am unsure whether or not they have actually been putting the cream on. The patient is not sure that we did talk to the nurse directly and she was going to initiate that treatment. Fortunately there does not appear to be any signs of active infection at this time. No fevers, chills, nausea, vomiting, or diarrhea. 4/28; the area on the right second toe is close to healed. Left medial ankle required debridement 12/07/2020 upon evaluation today patient appears to be doing well with regard to his wounds. In fact the right second toe appears to be completely healed which is great news. Fortunately there does not appear to be any signs of active infection at this time which is also great news. I think we can probably discontinue the gentamicin on top of everything else. 12/14/2020 upon evaluation today patient appears to be doing well with regard to his wound. He is making good progress and overall very pleased with where things stand today. There is no signs of active infection at this time which is great news. 12/28/2020 upon evaluation today patient appears to be doing well with regard to his wounds. He has been tolerating the dressing changes without complication. Fortunately there is no signs of active infection at this time. No fevers, chills, nausea, vomiting, or diarrhea. 12/28/2020 upon evaluation today patient's wound bed actually showed signs of excellent improvement. He has great epithelization and granulation I do not see any signs of infection overall I am extremely pleased with where things stand  at this point. No fevers, chills, nausea, vomiting, or diarrhea. 01/11/2021 upon evaluation today patient appears to be doing well with regard to his wound on his leg. He has been tolerating the dressing changes without complication. Fortunately there does not appear to be any signs of active infection which is great news. No fevers, chills, nausea, vomiting, or diarrhea. 01/25/2021 upon evaluation today patient appears to be doing well with regard to his wound. He has been tolerating the dressing changes without complication. Fortunately the collagen seems to be doing a great job which is excellent news. No fevers, chills, nausea, vomiting, or diarrhea. 02/08/2021 upon evaluation today patient's wound is actually looking a little bit worse especially in the periwound compared to previous. Fortunately there does not appear to be any signs of infection which is great news with that being said he does have some irritation around the periphery of the wound which has me more concerned. He actually had a dressing on that had not been changed in 3 days. He also is supposed to have daily dressing changes. With regard to the dressing applied he had a silver alginate dressing and silver collagen is what is recommended and ordered. He also had no Desitin around the edges of the wound in the periwound region although that is on the order inspect to be done as well. In general I was very concerned I did contact [facility] actually spoke with Magda Paganini who is the scheduling individual and subsequently she stated that she would pass the information to the D ON apparently the D ON was not available to talk to me when I call today. 02/18/2021 upon evaluation today patient's wound is actually showing signs of improvement. Fortunately there does not appear to be any evidence of infection which is great news overall I am extremely pleased with where things stand today. No fevers, chills, nausea, vomiting, or  diarrhea. 8/3; patient presents for 1 week follow-up. He has no issues or complaints today. He denies signs  of infection. 03/11/2021 upon evaluation today patient appears to be doing well with regard to his wound. He does have a little bit of slough noted on the surface of the wound but fortunately there does not appear to be any signs of active infection at this time. No fevers, chills, nausea, vomiting, or diarrhea. 03/18/2021 upon evaluation today patient appears to be doing well with regard to his wound. He has been tolerating the dressing changes without complication. There was a little irritation more proximal to where the wound was that was not noted last week but nonetheless this is very superficial just seems to be more irritation we just need to make sure to put a good amount of the zinc over the area in my opinion. Otherwise he does not seem to be doing significantly worse at all which is great news. 03/25/2021 upon evaluation today patient appears to be doing well with regard to his wound. He is going require some sharp debridement today to clear with some of the necrotic debris. I did perform this today without complication postdebridement wound bed appears to be doing much better this is great news. 04/08/2021 upon evaluation today patient appears to be doing decently well in regard to his wound although the overall measurement is not significantly smaller compared to previous. It is gone down a little bit but still the facility continues to not really put the appropriate dressings in place in fact he was supposed to have collagen we think he probably had more of an allergy to At this point. Fortunately there does not appear to be any signs of active infection systemically though locally I do not see anything on initial visualization either as far as erythema or warmth. 04/15/2021 upon evaluation today patient appears to be doing well with regard to his wound. He is actually showing signs  of improvement. I did place him on antibiotics last week, Cipro. He has been taking that 2 times a day and seems to be tolerating it very well. I do not see any evidence of worsening and in fact the overall appearance of the wound is smaller today which is also great news. 9/26; left medial ankle chronic venous insufficiency wound is improved. Using Hydrofera Blue 10/10; left medial ankle chronic venous insufficiency. Wound has not changed much in appearance completely nonviable surface. Apparently there have been problems getting the right product on the wound at the facility although he came in with [facility] on today 05/14/2021 upon evaluation today patient appears to be doing well with regard to his wound. I think he is making progress here which is good news. Fortunately there does not appear to be any signs of active infection at this time. No fevers, chills, nausea, vomiting, or diarrhea. 05/20/2021 upon evaluation today patient appears to be doing well with regard to his wound. He is showing signs of good improvement which is great news. There does not appear to be any evidence of active infection which is also excellent news. No fevers, chills, nausea, vomiting, or diarrhea. Chad [name], Chad [name] ([id number]) 05/28/2021 upon evaluation today patient appears to be doing quite well. There does not appear to be any signs of active infection at this time which is great news. Overall I am extremely pleased with where things stand today. I think he is headed in the right direction. 06/11/2021 upon evaluation today patient appears to be doing well with regard to his left ankle ulcer and poorly in regard to the toe ulcer on the second toe  right foot. This appears to show signs of joint exposure. Apparently this has been present for 1 to 2 months although he kept forgetting to tell me about it. That is unfortunate as right now it definitely appears to be doing significantly worse than what I would like  to see. There does not appear to be any signs of active infection systemically though locally I am concerned about the possibility of infection the toe is quite red. Again no one from the facility ever contacted [place] to advise that this was going on in the interim either. 06/17/2021 upon evaluation today patient presents for follow-up I did review his x-ray which showed a navicular bone fracture I am unsure of the chronicity of this. Subsequently he also had osteomyelitis of the toe which was what I was more concerned about this did not show up on x-ray but did show up on the pathology scrapings. This was listed as acute osteomyelitis. Nonetheless at this point I think that the antibiotic treatment is the best regimen to go with currently. The patient is in agreement with that plan. Nonetheless he has initially 30 days of doxycycline off likely extend that towards the end of the treatment cycle that will be around the middle of December for an additional 2 weeks. That all depends on how well he continues to heal. Nonetheless based on what I am seeing in the foot I did want a proceed with an MRI as well which I think will be helpful to identify if there is anything else that needs to be addressed from the standpoint of infection. 06/24/2021 upon evaluation today patient appears to be doing pretty well in regards to his wounds. I think both are actually showing signs of improvement which is good I did review his MRI today which did show signs of osteomyelitis of the middle and proximal phalanx on his right foot of the affected toe. With that being said this is actually showing signs of significant improvement today already with the antibiotic therapy I think the redness is also improved. Overall I think that we just need to give this some time with appropriate wound care we will see how things go potentially hyperbarics could be considered. 07/02/2021 upon inspection today patient actually appears to be  doing well in regard to his left ankle which is getting very close to complete resolution of pleased in that regard. Unfortunately he is continuing to have issues with his second toe right foot and this seems to still be very painful for him. Recommend he try something different from the standpoint of antibiotics. 07/15/2021 upon evaluation today patient appears to be doing actually pretty well in regard to his foot. This is actually showing signs of significant improvement which is great news. Overall I feel like the patient is improving both in regard to the second toe as well as the ankle on the left. With that being said the biggest issue that I do see currently is that he is needing to have a refill of the doxycycline that we previously treated him with. He also did see podiatry they are not going to recommend any amputation at this point since he seems to be doing quite well. For that reason we just need to keep things under control from an infection standpoint. 08/01/2021 upon evaluation today patient appears to be doing well with regard to his wound. He has been tolerating the dressing changes without complication. Fortunately there does not appear to be any evidence of active infection  locally nor systemically at this point. In fact I think everything is doing excellent in fact his second toe on the right foot is almost healed and the ankle on the left ankle region is actually very close to being healed as well. 08/08/2021 upon evaluation today patient appears to be doing well with regard to his wound. He has been tolerating the dressing changes without complication. Fortunately I do not see any signs of active infection at this time. Readmission: 12-06-2021 upon evaluation today patient presents for reevaluation here in the clinic he does tell me that he was being seen in facility at [facility] by a provider that was coming in. He is not sure who this was. He tells me however that the  wound seems to have gotten worse even compared to where it was when we last saw him at this point. With that being said I do believe that he is likely going need ongoing wound care here in the clinic and I do believe that we need to be the ones to frontline this since his wound does seem to be getting worse not better at this point. He voiced understanding. He is also in agreement with this plan and feels more comfortable coming here she tells me. Patient's medical history really has not changed since his prior admission he was only gone since January. 12-27-2021 upon evaluation today patient appears to be doing well with regard to his wound they did run out of the [employer] so they did not put anything on just an ABD pad with gentamicin. Still we are seeing some signs of good improvement here with some new epithelization which is great news. Objective Constitutional Well-nourished and well-hydrated in no acute distress. Vitals Time Taken: 9:11 AM, Height: 67 in, Weight: 150 lbs, BMI: 23.5, Temperature: 97.6 F, Pulse: 111 bpm, Respiratory Rate: 18 breaths/min, Blood Pressure: 105/67 mmHg. Respiratory normal breathing without difficulty. Psychiatric this patient is able to make decisions and demonstrates good insight into disease process. Alert and Oriented x 3. pleasant and cooperative. Chad [name], Chad [name] ([id number]) General Notes: Upon inspection patient's wound bed showed signs of good granulation at causation at this point. Fortunately I do not see any signs of infection locally or systemically which is great news and overall very pleased with where we stand today. Integumentary (Hair, Skin) Wound #11 status is Open. Original cause of wound was Gradually Appeared. The date acquired was: 11/06/2021. The wound has been in treatment 3 weeks. The wound is located on the Left,Proximal,Medial Ankle. The wound measures 1.8cm length x 1.3cm width x 0.1cm depth; 1.838cm^2 area and 0.184cm^3  volume. There is Fat Layer (Subcutaneous Tissue) exposed. There is no tunneling or undermining noted. There is a medium amount of serosanguineous drainage noted. There is large (67-100%) red granulation within the wound bed. There is a small (1-33%) amount of necrotic tissue within the wound bed including Adherent Slough. Wound #12 status is Open. Original cause of wound was Gradually Appeared. The date acquired was: 07/12/2019. The wound has been in treatment 3 weeks. The wound is located on the Left,Distal,Medial Ankle. The wound measures 3.7cm length x 2cm width x 0.2cm depth; 5.812cm^2 area and 1.162cm^3 volume. There is Fat Layer (Subcutaneous Tissue) exposed. There is no tunneling or undermining noted. There is a medium amount of serosanguineous drainage noted. There is medium (34-66%) red granulation within the wound bed. There is a medium (34-66%) amount of necrotic tissue within the wound bed including Adherent Slough. 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 There was a Excisional Skin/Subcutaneous Tissue Debridement with a total area of 2.34 sq cm performed by Tommie Sams., PA-C. With the following instrument(s): Curette to remove Viable and Non-Viable tissue/material. Material removed includes Subcutaneous Tissue and Slough and after achieving pain control using Lidocaine 4% Topical Solution. No specimens were taken. A time out was conducted at 09:58, prior to the start of the procedure. A Minimum amount of bleeding was controlled with Pressure. The procedure was  tolerated well. Post Debridement Measurements: 1.8cm length x 1.3cm width x 0.1cm depth; 0.184cm^3 volume. Character of Wound/Ulcer Post Debridement is stable. Severity of Tissue Post Debridement is: Fat layer exposed. Post procedure Diagnosis Wound #11: Same as Pre-Procedure Wound #12 Pre-procedure diagnosis of Wound #12 is a Venous Leg Ulcer located on the Left,Distal,Medial Ankle .Severity of Tissue Pre Debridement is: Fat layer exposed. There was a Excisional Skin/Subcutaneous Tissue Debridement with a total area of 7.4 sq cm performed by Tommie Sams., PA-C. With the following instrument(s): Curette to remove Viable and Non-Viable tissue/material. Material removed includes Subcutaneous Tissue and Slough and after achieving pain control using Lidocaine 4% Topical Solution. No specimens were taken. A time out was conducted at 09:58, prior to the start of the procedure. A Minimum amount of bleeding was controlled with Pressure. The procedure was tolerated well. Post Debridement Measurements: 3.7cm length x 2cm width x 0.2cm depth; 1.162cm^3 volume. Character of Wound/Ulcer Post Debridement is stable. Severity of Tissue Post Debridement is: Fat layer exposed. Post procedure Diagnosis Wound #12: Same as Pre-Procedure Plan 1. I would recommend that we go ahead and continue with the wound care measures as before with the [facility] were also used on the gentamicin underneath. 2. Also, recommend that we have the patient continue to monitor for any signs of worsening or infection. Obviously if anything changes she should let me know otherwise my plan is to continue with the measures that seem to be doing a good job for her. We will see patient back for reevaluation in 2 weeks here in the clinic. If anything worsens or changes patient will contact our office for additional recommendations. Chad [name], Chad [name] ([id number]) Electronic Signature(s) Signed: 12/27/2021 10:03:51 AM By: Worthy Keeler  PA-C Entered By: Worthy Keeler on 12/27/2021 10:03:51 Chad [name] ([id number]) -------------------------------------------------------------------------------- SuperBill Details Patient Name: Chad [name] Date of Service: 12/27/2021 Medical Record Number: [id number] Patient Account Number: [id number] Date of Birth/Sex: [birth year] (61 y.o. M) Treating RN: [name] Primary Care Provider: [name] Other Clinician: Referring Provider: [name] Treating Provider/Extender: Skipper Cliche in Treatment: 3 Diagnosis Coding ICD-10 Codes Code Description [phone] Chronic venous hypertension (idiopathic) with ulcer and inflammation of left lower extremity L97.322 Non-pressure chronic ulcer of left ankle with fat layer exposed 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 side Facility Procedures CPT4 Code: [id number] Description: 57017 - DEB SUBQ TISSUE 20 SQ CM/< Modifier: Quantity: 1 CPT4 Code: Description: ICD-10 Diagnosis Description L97.322 Non-pressure chronic ulcer of left ankle with fat layer exposed Modifier: Quantity: Physician Procedures CPT4 Code: 7939030 Description: 11042 - WC PHYS SUBQ TISS 20 SQ CM Modifier: Quantity: 1 CPT4 Code: Description: ICD-10 Diagnosis Description  A15.872 Non-pressure chronic ulcer of left ankle with fat layer exposed Modifier: Quantity: Electronic Signature(s) Signed: 12/27/2021 10:04:21 AM By: Worthy Keeler PA-C Entered By: Worthy Keeler on 12/27/2021 10:04:21

## 2021-12-27 NOTE — Progress Notes (Addendum)
Chad [name], Chad [name] ([id number]) Visit Report for 12/27/2021 Arrival Information Details Patient Name: Chad [name] Date of Service: 12/27/2021 9:15 AM Medical Record Number: [id number] Patient Account Number: [id number] Date of Birth/Sex: 1961-03-07 (61 y.o. M) Treating RN: [name] Primary Care Jamin Panther: [name] Other Clinician: Referring Laval Cafaro: [name] Treating Sabriyah Wilcher/Extender: Skipper Cliche in Treatment: 3 Visit Information History Since Last Visit Added or deleted any medications: No Patient Arrived: Wheel Chair Any new allergies or adverse reactions: No Arrival Time: 09:10 Had a fall or experienced change in No Accompanied By: self activities of daily living that may affect Transfer Assistance: EasyPivot Patient Lift risk of falls: Patient Identification Verified: Yes Hospitalized since last visit: No Secondary Verification Process Completed: Yes Has Dressing in Place as Prescribed: Yes Patient Requires Transmission-Based No Pain Present Now: No Precautions: Patient Has Alerts: Yes Patient Alerts: Patient on Blood Thinner NOT diabetic aspirin [facility] Signature(s) Signed: 12/27/2021 4:40:28 PM By: GLevora DredgeEntered By: GLevora Dredgeon 12/27/2021 09:11:16 Chad [name]([id number] -------------------------------------------------------------------------------- Clinic Level of Care Assessment Details Patient Name: Chad SabalDate of Service: 12/27/2021 9:15 AM Medical Record Number: [id number] Account Number: [id number] of Birth/Sex: 303-Sep-1962(61 y.o. M) Treating RN: GLevora DredgePrimary Care Deardra Hinkley: LErik ObeyOther Clinician: Referring Jovana Rembold: LErik ObeyTreating Zoriah Pulice/Extender: SSkipper Clichein Treatment: 3 Clinic Level of Care Assessment Items TOOL 1 Quantity Score '[]'  - Use when EandM and Procedure is performed on INITIAL visit 0 ASSESSMENTS - Nursing  Assessment / Reassessment '[]'  - General Physical Exam (combine w/ comprehensive assessment (listed just below) when performed on new 0 pt. evals) '[]'  - 0 Comprehensive Assessment (HX, ROS, Risk Assessments, Wounds Hx, etc.) ASSESSMENTS - Wound and Skin Assessment / Reassessment '[]'  - Dermatologic / Skin Assessment (not related to wound area) 0 ASSESSMENTS - Ostomy and/or Continence Assessment and Care '[]'  - Incontinence Assessment and Management 0 '[]'  - 0 Ostomy Care Assessment and Management (repouching, etc.) PROCESS - Coordination of Care '[]'  - Simple Patient / Family Education for ongoing care 0 '[]'  - 0 Complex (extensive) Patient / Family Education for ongoing care '[]'  - 0 Staff obtains CProgrammer, systems Records, Test Results / Process Orders '[]'  - 0 Staff telephones HHA, Nursing Homes / Clarify orders / etc '[]'  - 0 Routine Transfer to another Facility (non-emergent condition) '[]'  - 0 Routine Hospital Admission (non-emergent condition) '[]'  - 0 New Admissions / IBiomedical engineer/ Ordering NPWT, Apligraf, etc. '[]'  - 0 Emergency Hospital Admission (emergent condition) PROCESS - Special Needs '[]'  - Pediatric / Minor Patient Management 0 '[]'  - 0 Isolation Patient Management '[]'  - 0 Hearing / Language / Visual special needs '[]'  - 0 Assessment of Community assistance (transportation, D/C planning, etc.) '[]'  - 0 Additional assistance / Altered mentation '[]'  - 0 Support Surface(s) Assessment (bed, cushion, seat, etc.) INTERVENTIONS - Miscellaneous '[]'  - External ear exam 0 '[]'  - 0 Patient Transfer (multiple staff / HCivil Service fast streamer/ Similar devices) '[]'  - 0 Simple Staple / Suture removal (25 or less) '[]'  - 0 Complex Staple / Suture removal (26 or more) '[]'  - 0 Hypo/Hyperglycemic Management (do not check if billed separately) '[]'  - 0 Ankle / Brachial Index (ABI) - do not check if billed separately Has the patient been seen at the hospital within the last three years: Yes Total Score: 0 Level Of  Care: ____ Chad [name]([id number] Electronic Signature(s) Signed: 12/27/2021 4:40:28 PM By: GLevora DredgeEntered By: GLevora Dredgeon 12/27/2021 10:18:07 Chad [name], Chad [name] (  [id number]) -------------------------------------------------------------------------------- Encounter Discharge Information Details Patient Name: Chad [name], Chad [name] Date of Service: 12/27/2021 9:15 AM Medical Record Number: [id number] Patient Account Number: [id number] Date of Birth/Sex: [birth year] (61 y.o. M) Treating RN: [name] Primary Care Malayjah Otoole: [name] Other Clinician: Referring Kenika Sahm: [name] Treating Ellar Hakala/Extender: Skipper Cliche in Treatment: 3 Encounter Discharge Information Items Post Procedure Vitals Discharge Condition: Stable Temperature (F): 97.6 Ambulatory Status: Wheelchair Pulse (bpm): 111 Discharge Destination: [place] Respiratory Rate (breaths/min): 18 Telephoned: No Blood Pressure (mmHg): 105/67 Orders Sent: Yes Transportation: Other Accompanied By: self Schedule Follow-up Appointment: Yes Clinical Summary of Care: Electronic Signature(s) Signed: 12/27/2021 10:19:39 AM By: [name] Entered By: [name] on 12/27/2021 10:19:38 Chad [name] ([id number]) -------------------------------------------------------------------------------- Lower Extremity Assessment Details Patient Name: Chad [name] Date of Service: 12/27/2021 9:15 AM Medical Record Number: [id number] Patient Account Number: [id number] Date of Birth/Sex: July 25, 1961 (61 y.o. M) Treating RN: [name] Primary Care Aleksia Freiman: [name] Other Clinician: Referring Eloina Ergle: [name] Treating Terreon Ekholm/Extender: Jeri Cos Weeks in Treatment: 3 Edema Assessment Assessed: [Left: No] [Right: No] Edema: [Left: N] [Right: o] Calf Left: Right: Point of Measurement: 31 cm From Medial Instep 30.8 cm Ankle Left: Right: Point of Measurement: 11 cm  From Medial Instep 22.2 cm Vascular Assessment Pulses: Dorsalis Pedis Palpable: [Left:Yes] Electronic Signature(s) Signed: 12/27/2021 4:40:28 PM By: [name] Entered By: [name] on 12/27/2021 09:22:23 Chad [name] ([id number]) -------------------------------------------------------------------------------- Multi Wound Chart Details Patient Name: Chad [name] Date of Service: 12/27/2021 9:15 AM Medical Record Number: [id number] Patient Account Number: [id number] Date of Birth/Sex: [birth year] (61 y.o. M) Treating RN: [name] Primary Care Kebin Maye: [name] Other Clinician: Referring Paxtyn Boyar: [name] Treating Rahi Chandonnet/Extender: Skipper Cliche in Treatment: 3 Vital Signs Height(in): 46 Pulse(bpm): 111 Weight(lbs): 150 Blood Pressure(mmHg): 105/67 Body Mass Index(BMI): 23.5 Temperature(F): 97.6 Respiratory Rate(breaths/min): 18 Photos: [N/A:N/A] Wound Location: Left, Proximal, Medial Ankle Left, Distal, Medial Ankle N/A Wounding Event: Gradually Appeared Gradually Appeared N/A Primary Etiology: Venous Leg Ulcer Venous Leg Ulcer N/A Comorbid History: Anemia, Chronic Obstructive Anemia, Chronic Obstructive N/A Pulmonary Disease (COPD), Pulmonary Disease (COPD), Coronary Artery Disease, Peripheral Coronary Artery Disease, Peripheral Arterial Disease, Peripheral Venous Arterial Disease, Peripheral Venous Disease, Hepatitis C, Osteoarthritis, Disease, Hepatitis C, Osteoarthritis, Neuropathy, Received Neuropathy, Received Chemotherapy Chemotherapy Date Acquired: 11/06/2021 07/12/2019 N/A Weeks of Treatment: 3 3 N/A Wound Status: Open Open N/A Wound Recurrence: No No N/A Measurements L x W x D (cm) 1.8x1.3x0.1 3.7x2x0.2 N/A Area (cm) : 1.838 5.812 N/A Volume (cm) : 0.184 1.162 N/A % Reduction in Area: 53.60% 15.90% N/A % Reduction in Volume: 53.50% 15.90% N/A Classification: Full Thickness Without Exposed Full Thickness Without Exposed  N/A Support Structures Support Structures Exudate Amount: Medium Medium N/A Exudate Type: Serosanguineous Serosanguineous N/A Exudate Color: red, brown red, brown N/A Granulation Amount: Large (67-100%) Medium (34-66%) N/A Granulation Quality: Red Red N/A Necrotic Amount: Small (1-33%) Medium (34-66%) N/A Exposed Structures: Fat Layer (Subcutaneous Tissue): Fat Layer (Subcutaneous Tissue): N/A Yes Yes Fascia: No Fascia: No Tendon: No Tendon: No Muscle: No Muscle: No Joint: No Joint: No Bone: No Bone: No Epithelialization: Small (1-33%) Small (1-33%) N/A Debridement: Debridement - Excisional Debridement - Excisional N/A Pre-procedure Verification/Time 09:58 09:58 N/A Out Taken: Pain Control: Lidocaine 4% Topical Solution Lidocaine 4% Topical Solution N/A Tissue Debrided: Subcutaneous, Slough Subcutaneous, Slough N/A Level: Skin/Subcutaneous Tissue Skin/Subcutaneous Tissue N/A Debridement Area (sq cm): 2.34 7.4 N/A Instrument: Curette Curette N/A Chad [name], Chad [name] ([id number]) Bleeding: Minimum Minimum N/A Hemostasis Achieved: Pressure Pressure N/A Debridement Treatment Procedure  was tolerated well Procedure was tolerated well N/A Response: Post Debridement 1.8x1.3x0.1 3.7x2x0.2 N/A Measurements L x W x D (cm) Post Debridement Volume: 0.184 1.162 N/A (cm) Procedures Performed: Debridement Debridement N/A Treatment Notes Electronic Signature(s) Signed: 12/27/2021 10:17:56 AM By: [name] Entered By: [name] on 12/27/2021 10:17:56 Chad [name] ([id number]) -------------------------------------------------------------------------------- Multi-Disciplinary Care Plan Details Patient Name: Chad [name] Date of Service: 12/27/2021 9:15 AM Medical Record Number: [id number] Patient Account Number: [id number] Date of Birth/Sex: May 03, 1961 (61 y.o. M) Treating RN: [name] Primary Care Carla Rashad: [name] Other Clinician: Referring Ashir Kunz: [name] Treating Perri Lamagna/Extender: Skipper Cliche in Treatment: 3 Active Inactive Venous Leg Ulcer Nursing Diagnoses: Knowledge deficit related to disease process and management Goals: Patient will maintain optimal edema control Date Initiated: 12/06/2021 Date Inactivated: 12/27/2021 Target Resolution Date: 01/03/2022 Goal Status: Met Patient/caregiver will verbalize understanding of disease process and disease management Date Initiated: 12/06/2021 Target Resolution Date: 01/03/2022 Goal Status: Active Interventions: Assess peripheral edema status every visit. Compression as ordered Notes: Electronic Signature(s) Signed: 12/27/2021 10:17:45 AM By: [name] Entered By: [name] on 12/27/2021 10:17:45 Chad [name] ([id number]) -------------------------------------------------------------------------------- Pain Assessment Details Patient Name: Chad [name] Date of Service: 12/27/2021 9:15 AM Medical Record Number: [id number] Patient Account Number: [id number] Date of Birth/Sex: 1960-11-14 (61 y.o. M) Treating RN: [name] Primary Care Ziyana Morikawa: [name] Other Clinician: Referring Zakee Deerman: [name] Treating Jaquann Guarisco/Extender: Skipper Cliche in Treatment: 3 Active Problems Location of Pain Severity and Description of Pain Patient Has Paino No Site Locations Rate the pain. Current Pain Level: 0 Pain Management and Medication Current Pain Management: Electronic Signature(s) Signed: 12/27/2021 4:40:28 PM By: [name] Entered By: [name] on 12/27/2021 09:13:59 Chad [name] ([id number]) -------------------------------------------------------------------------------- Patient/Caregiver Education Details Patient Name: Chad [name] Date of Service: 12/27/2021 9:15 AM Medical Record Number: [id number] Patient Account Number: [id number] Date of Birth/Gender: 01-May-1961 (61 y.o. M) Treating RN: [name] Primary Care Physician:  [name] Other Clinician: Referring Physician: [name] Treating Physician/Extender: Skipper Cliche in Treatment: 3 Education Assessment Education Provided To: Patient Education Topics Provided Wound/Skin Impairment: Handouts: Caring for Your Ulcer Methods: Explain/Verbal Responses: State content correctly Electronic Signature(s) Signed: 12/27/2021 4:40:28 PM By: [name] Entered By: [name] on 12/27/2021 10:18:43 Chad [name] ([id number]) -------------------------------------------------------------------------------- Wound Assessment Details Patient Name: Chad [name] Date of Service: 12/27/2021 9:15 AM Medical Record Number: [id number] Patient Account Number: [id number] Date of Birth/Sex: 11-15-1960 (61 y.o. M) Treating RN: [name] Primary Care Nicolaos Mitrano: [name] Other Clinician: Referring Jalisia Puchalski: [name] Treating Estle Huguley/Extender: Jeri Cos Weeks in Treatment: 3 Wound Status Wound Number: 11 Primary Venous Leg Ulcer Etiology: Wound Location: Left, Proximal, Medial Ankle Wound Open Wounding Event: Gradually Appeared Status: Date Acquired: 11/06/2021 Comorbid Anemia, Chronic Obstructive Pulmonary Disease (COPD), Weeks Of Treatment: 3 History: Coronary Artery Disease, Peripheral Arterial Disease, Clustered Wound: No Peripheral Venous Disease, Hepatitis C, Osteoarthritis, Neuropathy, Received Chemotherapy Photos Wound Measurements Length: (cm) 1.8 Width: (cm) 1.3 Depth: (cm) 0.1 Area: (cm) 1.838 Volume: (cm) 0.184 % Reduction in Area: 53.6% % Reduction in Volume: 53.5% Epithelialization: Small (1-33%) Tunneling: No Undermining: No Wound Description Classification: Full Thickness Without Exposed Support Structu Exudate Amount: Medium Exudate Type: Serosanguineous Exudate Color: red, brown res Foul Odor After Cleansing: No Slough/Fibrino Yes Wound Bed Granulation Amount: Large (67-100%) Exposed  Structure Granulation Quality: Red Fascia Exposed: No Necrotic Amount: Small (1-33%) Fat Layer (Subcutaneous Tissue) Exposed: Yes Necrotic Quality: Adherent Slough Tendon Exposed: No Muscle Exposed: No Joint Exposed: No Bone Exposed: No Treatment  Notes Wound #11 (Ankle) Wound Laterality: Left, Medial, Proximal Cleanser Soap and Water Discharge Instruction: Gently cleanse wound with antibacterial soap, rinse and pat dry prior to dressing wounds Wound Cleanser Chad [name], Chad [name] ([id number]) Discharge Instruction: Wash your hands with soap and water. Remove old dressing, discard into plastic bag and place into trash. Cleanse the wound with Wound Cleanser prior to applying a clean dressing using gauze sponges, not tissues or cotton balls. Do not scrub or use excessive force. Pat dry using gauze sponges, not tissue or cotton balls. Peri-Wound Care Topical Gentamicin Discharge Instruction: Apply as directed by Kale Dols. Primary Dressing Hydrofera Blue Ready Transfer Foam, 2.5x2.5 (in/in) Discharge Instruction: Apply Hydrofera Blue Ready to wound bed as directed Secondary Dressing ABD Pad 5x9 (in/in) Discharge Instruction: Cover with ABD pad Secured With [place] H Soft Cloth Surgical Tape, 2x2 (in/yd) Kerlix Roll Sterile or Non-Sterile 6-ply 4.5x4 (yd/yd) Discharge Instruction: Apply Kerlix as directed Compression Wrap Compression Stockings Add-Ons Electronic Signature(s) Signed: 12/27/2021 4:40:28 PM By: [name] Entered By: [name] on 12/27/2021 09:23:06 Chad [name] ([id number]) -------------------------------------------------------------------------------- Wound Assessment Details Patient Name: Chad [name] Date of Service: 12/27/2021 9:15 AM Medical Record Number: [id number] Patient Account Number: [id number] Date of Birth/Sex: [birth year] (61 y.o. M) Treating RN: [name] Primary Care Artemus Romanoff: [name] Other  Clinician: Referring Glendell Schlottman: [name] Treating Mackinzee Roszak/Extender: Jeri Cos Weeks in Treatment: 3 Wound Status Wound Number: 12 Primary Venous Leg Ulcer Etiology: Wound Location: Left, Distal, Medial Ankle Wound Open Wounding Event: Gradually Appeared Status: Date Acquired: 07/12/2019 Comorbid Anemia, Chronic Obstructive Pulmonary Disease (COPD), Weeks Of Treatment: 3 History: Coronary Artery Disease, Peripheral Arterial Disease, Clustered Wound: No Peripheral Venous Disease, Hepatitis C, Osteoarthritis, Neuropathy, Received Chemotherapy Photos Wound Measurements Length: (cm) 3.7 Width: (cm) 2 Depth: (cm) 0.2 Area: (cm) 5.812 Volume: (cm) 1.162 % Reduction in Area: 15.9% % Reduction in Volume: 15.9% Epithelialization: Small (1-33%) Tunneling: No Undermining: No Wound Description Classification: Full Thickness Without Exposed Support Structu Exudate Amount: Medium Exudate Type: Serosanguineous Exudate Color: red, brown res Foul Odor After Cleansing: No Slough/Fibrino Yes Wound Bed Granulation Amount: Medium (34-66%) Exposed Structure Granulation Quality: Red Fascia Exposed: No Necrotic Amount: Medium (34-66%) Fat Layer (Subcutaneous Tissue) Exposed: Yes Necrotic Quality: Adherent Slough Tendon Exposed: No Muscle Exposed: No Joint Exposed: No Bone Exposed: No Treatment Notes Wound #12 (Ankle) Wound Laterality: Left, Medial, Distal Cleanser Soap and Water Discharge Instruction: Gently cleanse wound with antibacterial soap, rinse and pat dry prior to dressing wounds Wound Cleanser Chad [name], Chad [name] ([id number]) Discharge Instruction: Wash your hands with soap and water. Remove old dressing, discard into plastic bag and place into trash. Cleanse the wound with Wound Cleanser prior to applying a clean dressing using gauze sponges, not tissues or cotton balls. Do not scrub or use excessive force. Pat dry using gauze sponges, not tissue or cotton  balls. Peri-Wound Care Topical Gentamicin Discharge Instruction: Apply as directed by Vir Whetstine. Primary Dressing Hydrofera Blue Ready Transfer Foam, 2.5x2.5 (in/in) Discharge Instruction: Apply Hydrofera Blue Ready to wound bed as directed Secondary Dressing ABD Pad 5x9 (in/in) Discharge Instruction: Cover with ABD pad Secured With [place] H Soft Cloth Surgical Tape, 2x2 (in/yd) Kerlix Roll Sterile or Non-Sterile 6-ply 4.5x4 (yd/yd) Discharge Instruction: Apply Kerlix as directed Compression Wrap Compression Stockings Add-Ons Electronic Signature(s) Signed: 12/27/2021 4:40:28 PM By: [name] Entered By: [name] on 12/27/2021 09:23:41 Chad [name] ([id number]) -------------------------------------------------------------------------------- [place] Details Patient Name: Chad [name] Date of Service: 12/27/2021 9:15 AM Medical Record  Number: [id number] Patient Account Number: [id number] Date of Birth/Sex: [birth year] (61 y.o. M) Treating RN: [name] Primary Care Monico Sudduth: [name] Other Clinician: Referring Patsey Pitstick: [name] Treating Airiel Oblinger/Extender: Skipper Cliche in Treatment: 3 Vital Signs Time Taken: 09:11 Temperature (F): 97.6 Height (in): 67 Pulse (bpm): 111 Weight (lbs): 150 Respiratory Rate (breaths/min): 18 Body Mass Index (BMI): 23.5 Blood Pressure (mmHg): 105/67 Reference Range: 80 - 120 mg / dl Electronic Signature(s) Signed: 12/27/2021 4:40:28 PM By: [name] Entered By: [name] on 12/27/2021 09:13:51

## 2022-01-09 ENCOUNTER — Inpatient Hospital Stay: Payer: Medicaid Other

## 2022-01-09 ENCOUNTER — Inpatient Hospital Stay: Payer: Medicaid Other | Attending: Internal Medicine | Admitting: Internal Medicine

## 2022-01-09 ENCOUNTER — Encounter: Payer: Self-pay | Admitting: Internal Medicine

## 2022-01-09 VITALS — BP 117/79 | HR 78 | Temp 97.8°F | Ht 69.0 in | Wt 157.8 lb

## 2022-01-09 DIAGNOSIS — Z7189 Other specified counseling: Secondary | ICD-10-CM

## 2022-01-09 DIAGNOSIS — Z515 Encounter for palliative care: Secondary | ICD-10-CM

## 2022-01-09 DIAGNOSIS — Z5112 Encounter for antineoplastic immunotherapy: Secondary | ICD-10-CM | POA: Insufficient documentation

## 2022-01-09 DIAGNOSIS — D509 Iron deficiency anemia, unspecified: Secondary | ICD-10-CM | POA: Diagnosis not present

## 2022-01-09 DIAGNOSIS — C661 Malignant neoplasm of right ureter: Secondary | ICD-10-CM | POA: Diagnosis present

## 2022-01-09 DIAGNOSIS — C187 Malignant neoplasm of sigmoid colon: Secondary | ICD-10-CM | POA: Diagnosis not present

## 2022-01-09 LAB — COMPREHENSIVE METABOLIC PANEL
ALT: 18 U/L (ref 0–44)
AST: 31 U/L (ref 15–41)
Albumin: 3 g/dL — ABNORMAL LOW (ref 3.5–5.0)
Alkaline Phosphatase: 110 U/L (ref 38–126)
Anion gap: 6 (ref 5–15)
BUN: 16 mg/dL (ref 8–23)
CO2: 27 mmol/L (ref 22–32)
Calcium: 8.3 mg/dL — ABNORMAL LOW (ref 8.9–10.3)
Chloride: 103 mmol/L (ref 98–111)
Creatinine, Ser: 0.98 mg/dL (ref 0.61–1.24)
GFR, Estimated: >60 mL/min (ref >60)
Glucose, Bld: 115 mg/dL — ABNORMAL HIGH (ref 70–99)
Potassium: 4.1 mmol/L (ref 3.5–5.1)
Sodium: 136 mmol/L (ref 135–145)
Total Bilirubin: 0.5 mg/dL (ref 0.3–1.2)
Total Protein: 6.7 g/dL (ref 6.5–8.1)

## 2022-01-09 LAB — CBC WITH DIFFERENTIAL/PLATELET
Abs Immature Granulocytes: 0.02 10*3/uL (ref 0.00–0.07)
Basophils Absolute: 0.1 10*3/uL (ref 0.0–0.1)
Basophils Relative: 1 %
Eosinophils Absolute: 0.3 10*3/uL (ref 0.0–0.5)
Eosinophils Relative: 5 %
HCT: 37.7 % — ABNORMAL LOW (ref 39.0–52.0)
Hemoglobin: 11.9 g/dL — ABNORMAL LOW (ref 13.0–17.0)
Immature Granulocytes: 0 %
Lymphocytes Relative: 16 %
Lymphs Abs: 1 10*3/uL (ref 0.7–4.0)
MCH: 26.6 pg (ref 26.0–34.0)
MCHC: 31.6 g/dL (ref 30.0–36.0)
MCV: 84.2 fL (ref 80.0–100.0)
Monocytes Absolute: 0.5 10*3/uL (ref 0.1–1.0)
Monocytes Relative: 7 %
Neutro Abs: 4.4 10*3/uL (ref 1.7–7.7)
Neutrophils Relative %: 71 %
Platelets: 156 10*3/uL (ref 150–400)
RBC: 4.48 MIL/uL (ref 4.22–5.81)
RDW: 18.3 % — ABNORMAL HIGH (ref 11.5–15.5)
WBC: 6.3 10*3/uL (ref 4.0–10.5)
nRBC: 0 % (ref 0.0–0.2)

## 2022-01-09 LAB — VITAMIN D 25 HYDROXY (VIT D DEFICIENCY, FRACTURES): Vit D, 25-Hydroxy: 30.37 ng/mL (ref 30–100)

## 2022-01-09 MED ORDER — HEPARIN SOD (PORK) LOCK FLUSH 100 UNIT/ML IV SOLN
INTRAVENOUS | Status: DC
Start: 2022-01-09 — End: 2022-01-09
  Filled 2022-01-09: qty 5

## 2022-01-09 MED ORDER — HEPARIN SOD (PORK) LOCK FLUSH 100 UNIT/ML IV SOLN
500.0000 [IU] | Freq: Once | INTRAVENOUS | Status: AC | PRN
  Administered 2022-01-09: 500 [IU]
  Filled 2022-01-09: qty 5

## 2022-01-09 MED ORDER — SODIUM CHLORIDE 0.9 % IV SOLN
200.0000 mg | Freq: Once | INTRAVENOUS | Status: AC
  Administered 2022-01-09: 200 mg via INTRAVENOUS
  Filled 2022-01-09: qty 8

## 2022-01-09 MED ORDER — SODIUM CHLORIDE 0.9 % IV SOLN
Freq: Once | INTRAVENOUS | Status: AC
  Filled 2022-01-09: qty 250

## 2022-01-09 NOTE — Patient Instructions (Signed)
[facility]  Discharge Instructions: Thank you for choosing [facility] to provide your oncology and hematology care.   If you have a lab appointment with the Cancer Center, please go directly to the Cancer Center and check in at the registration area.   Wear comfortable clothing and clothing appropriate for easy access to any Portacath or PICC line.   We strive to give you quality time with your provider. You may need to reschedule your appointment if you arrive late (15 or more minutes).  Arriving late affects you and other patients whose appointments are after yours.  Also, if you miss three or more appointments without notifying the office, you may be dismissed from the clinic at the provider's discretion.      For prescription refill requests, have your pharmacy contact our office and allow 72 hours for refills to be completed.    Today you received the following chemotherapy and/or immunotherapy agents       To help prevent nausea and vomiting after your treatment, we encourage you to take your nausea medication as directed.  BELOW ARE SYMPTOMS THAT SHOULD BE REPORTED IMMEDIATELY: *FEVER GREATER THAN 100.4 F (38 C) OR HIGHER *CHILLS OR SWEATING *NAUSEA AND VOMITING THAT IS NOT CONTROLLED WITH YOUR NAUSEA MEDICATION *UNUSUAL SHORTNESS OF BREATH *UNUSUAL BRUISING OR BLEEDING *URINARY PROBLEMS (pain or burning when urinating, or frequent urination) *BOWEL PROBLEMS (unusual diarrhea, constipation, pain near the anus) TENDERNESS IN MOUTH AND THROAT WITH OR WITHOUT PRESENCE OF ULCERS (sore throat, sores in mouth, or a toothache) UNUSUAL RASH, SWELLING OR PAIN  UNUSUAL VAGINAL DISCHARGE OR ITCHING   Items with * indicate a potential emergency and should be followed up as soon as possible or go to the Emergency Department if any problems should occur.  Please show the CHEMOTHERAPY ALERT CARD or IMMUNOTHERAPY ALERT CARD at check-in to the  Emergency Department and triage nurse.  Should you have questions after your visit or need to cancel or reschedule your appointment, please contact [facility]  Dept: [phone]  and follow the prompts.  Office hours are 8:00 a.m. to 4:30 p.m. Monday - Friday. Please note that voicemails left after 4:00 p.m. may not be returned until the following business day.  We are closed weekends and major holidays. You have access to a nurse at all times for urgent questions. Please call the main number to the clinic Dept: [phone] and follow the prompts.   For any non-urgent questions, you may also contact your provider using MyChart. We now offer e-Visits for anyone 18 and older to request care online for non-urgent symptoms. For details visit [email or web address].   Also download the MyChart app! Go to the app store, search "MyChart", open the app, select [facility], and log in with your MyChart username and password.  Masks are optional in the cancer centers. If you would like for your care team to wear a mask while they are taking care of you, please let them know. For doctor visits, patients may have with them one support person who is at least 63 years old. At this time, visitors are not allowed in the infusion area. 

## 2022-01-09 NOTE — Progress Notes (Signed)
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  Is alone/in a wheelchair.  Chad [name] 61 y.o.  male above history of stage IV-ureteral cancer/history of stage III colon cancer right side; recurrent sigmoid colon cancer [un-resected/under surveillance] and multiple other comorbidities currently on Keytruda is here for follow-up.  Patient complains of increased frequency of urination, hence stopped Flomax.   Denies any worsening abdominal pain.  Any nausea vomiting or constipation.  No blood in stools or black-colored stools.  No hematuria.  Chronic shortness of breath.  Review of Systems   Constitutional:  Positive for malaise/fatigue. Negative for chills, diaphoresis, fever and weight loss.  HENT:  Negative for nosebleeds and sore throat.   Eyes:  Negative for double vision.  Respiratory:  Positive for cough and shortness of breath. Negative for hemoptysis and wheezing.   Cardiovascular:  Negative for chest pain, palpitations and orthopnea.  Gastrointestinal:  Positive for constipation. Negative for abdominal pain, blood in stool, diarrhea, heartburn, melena, nausea and vomiting.  Genitourinary:  Negative for dysuria, frequency and urgency.  Musculoskeletal:  Positive for back pain and joint pain.  Skin: Negative.  Negative for itching and rash.  Neurological:  Positive for focal weakness. Negative for dizziness, tingling, weakness and headaches.       Chronic left-sided weakness upper than lower extremity.  Endo/Heme/Allergies:  Does not bruise/bleed easily.  Psychiatric/Behavioral:  Negative for depression. The patient is not nervous/anxious and does not have insomnia.      MEDICAL HISTORY:  Past Medical History:  Diagnosis Date  . Anemia   . Anxiety   . ARF (acute respiratory failure) ([place])   . Bladder cancer ([place])   . COPD (chronic obstructive pulmonary disease) ([place])   . Depression   . Dysphagia   . Family history of colon cancer   . Family history of kidney cancer   . Family history of leukemia   . Family history of prostate cancer   . GERD (gastroesophageal reflux disease)   . Hepatitis    chronic hep c  . Hydronephrosis   . Hydronephrosis with ureteral stricture   . Hyperlipidemia   . Knee pain    Left  . Malignant neoplasm of colon ([place])   . Nerve pain   .  Peripheral vascular disease ([place])   . Prostate cancer ([place])   . Stroke ([place])   . Ureteral cancer, right ([place])   . Urinary frequency   . Venous hypertension of both lower extremities     SURGICAL HISTORY: Past Surgical History:  Procedure Laterality Date  . COLON SURGERY     En bloc  extended right hemicolectomy 07/2017  . COLONOSCOPY WITH PROPOFOL N/A 11/06/2020   Procedure: COLONOSCOPY WITH PROPOFOL;  Surgeon: Jonathon Bellows, MD;  Location: [facility] ENDOSCOPY;  Service: Gastroenterology;  Laterality: N/A;  . COLONOSCOPY WITH PROPOFOL N/A 07/31/2021   Procedure: COLONOSCOPY WITH PROPOFOL;  Surgeon: Delos Bellow, MD;  Location: [facility] ENDOSCOPY;  Service: Endoscopy;  Laterality: N/A;  . CYSTOSCOPY W/ RETROGRADES Right 08/30/2018   Procedure: CYSTOSCOPY WITH RETROGRADE PYELOGRAM;  Surgeon: Hollice Espy, MD;  Location: [facility] ORS;  Service: Urology;  Laterality: Right;  . CYSTOSCOPY WITH STENT PLACEMENT Right 04/25/2018   Procedure: CYSTOSCOPY WITH STENT PLACEMENT;  Surgeon: Hollice Espy, MD;  Location: [facility] ORS;  Service: Urology;  Laterality: Right;  . CYSTOSCOPY WITH STENT PLACEMENT Right 08/30/2018   Procedure: [place] WITH STENT Exchange;  Surgeon: Hollice Espy, MD;  Location: [facility] ORS;  Service: Urology;  Laterality: Right;  . CYSTOSCOPY WITH STENT PLACEMENT Right 03/07/2019   Procedure: CYSTOSCOPY WITH STENT Exchange;  Surgeon: Hollice Espy, MD;  Location: [facility] ORS;  Service: Urology;  Laterality: Right;  . CYSTOSCOPY WITH STENT PLACEMENT Right 11/21/2019   Procedure: CYSTOSCOPY WITH STENT Exchange;  Surgeon: Hollice Espy, MD;  Location: [facility] ORS;  Service: Urology;  Laterality: Right;  . LOWER EXTREMITY ANGIOGRAPHY Left 05/23/2019   Procedure: LOWER EXTREMITY ANGIOGRAPHY;  Surgeon: Algernon Huxley, MD;  Location: [place] CV LAB;  Service: Cardiovascular;  Laterality: Left;  . LOWER EXTREMITY ANGIOGRAPHY Right 05/30/2019   Procedure: LOWER EXTREMITY ANGIOGRAPHY;  Surgeon: Algernon Huxley, MD;  Location: [place] CV LAB;  Service: Cardiovascular;  Laterality: Right;  . LOWER EXTREMITY ANGIOGRAPHY Right 02/13/2020   Procedure: LOWER EXTREMITY ANGIOGRAPHY;  Surgeon: Algernon Huxley, MD;  Location: [place] CV LAB;  Service: Cardiovascular;  Laterality: Right;  .  LOWER EXTREMITY ANGIOGRAPHY Left 02/20/2020   Procedure: LOWER EXTREMITY ANGIOGRAPHY;  Surgeon: Algernon Huxley, MD;  Location: [place] CV LAB;  Service: Cardiovascular;  Laterality: Left;  . PORTA CATH INSERTION N/A 02/28/2019   Procedure: PORTA CATH INSERTION;  Surgeon: Algernon Huxley, MD;  Location: [place] CV LAB;  Service: Cardiovascular;  Laterality: N/A;  . tumor removed       SOCIAL HISTORY: Social History   Socioeconomic History  . Marital status: Single    Spouse name: Not on file  . Number of children: Not on file  . Years of education: Not on file  . Highest education level: Not on file  Occupational History  . Not on file  Tobacco Use  . Smoking status: Every Day    Packs/day: 1.00    Types: Cigarettes  . Smokeless tobacco: Never  Vaping Use  . Vaping Use: Never used  Substance and Sexual Activity  . Alcohol use: Not Currently  . Drug use: Not Currently  . Sexual activity: Not Currently  Other Topics Concern  . Not on file  Social History Narrative    used to live [place]; moved  To [place]- end of April 2019; in Nursing home; 1pp/day; quit alcohol. 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        Family- dad- prostate ca [at 41y]; brother-  8 died of prostate cancer; brother- 15- no cancers [[place]]; sonGerald Stabs [[place]];[name]-32y prostate ca [facility] [place]]; daughter- 45 [[place]]; another daughter 34 [[place]/addict]. will refer genetics counseling. Given MSI- abnormal; highly suspicious of Lynch syndrome.  Patient's son [name] aware of high possible lynch syndrome.   Social Determinants of Health   Financial Resource Strain: Not on file  Food Insecurity: Not on file  Transportation Needs: Not on file  Physical Activity: Not on file  Stress: Not on file  Social Connections: Not on file  Intimate Partner Violence: Not on file    FAMILY HISTORY: Family History  Problem Relation Age of Onset  . Prostate cancer Father 68  . Cancer Brother 18       unsure type  . Cancer  Paternal Uncle        unsure type  . Cancer Maternal Grandmother        unsure type  . Cancer Paternal Grandmother        unsure type  . Kidney cancer Paternal Grandfather   . Cancer Other        unsure types  . Leukemia Son   . Cancer Son        other cancers, possibly colon    ALLERGIES:  is allergic to penicillins.  MEDICATIONS:  Current Outpatient Medications  Medication Sig Dispense Refill  . acetaminophen (TYLENOL) 325 MG tablet Take 650 mg by mouth 3 (three) times daily.     Marland Kitchen aspirin EC 81 MG tablet Take 1 tablet (81 mg total) by mouth daily. 150 tablet 2  . carboxymethylcellulose 1 % ophthalmic solution Apply 1 drop to eye 2 (two) times daily.    . clopidogrel (PLAVIX) 75 MG tablet Take 1 tablet (75 mg total) by mouth daily. 30 tablet 11  . cyclobenzaprine (FLEXERIL) 10 MG tablet Take 10 mg by mouth at bedtime.    Marland Kitchen escitalopram (LEXAPRO) 5 MG tablet Take 5 mg by mouth daily.    Marland Kitchen gabapentin (NEURONTIN) 100 MG capsule Take 100 mg by mouth 3 (three) times daily.    Marland Kitchen gentamicin ointment (GARAMYCIN) 0.1 % Apply 1 application. topically 3 (three) times daily.    . mirtazapine (REMERON) 7.5 MG tablet Take 7.5 mg by mouth at bedtime.    . Oxycodone HCl 10 MG TABS Take 10 mg by mouth every 6 (six) hours as needed for pain.    . pantoprazole (PROTONIX) 40 MG tablet Take 40 mg by mouth daily.    Marland Kitchen senna-docusate (SENOKOT-S) 8.6-50 MG tablet Take 1 tablet by mouth daily.    . tamsulosin (FLOMAX) 0.4 MG CAPS capsule Take 1 capsule (0.4 mg total) by mouth daily after supper. 30 capsule 3  . atorvastatin (LIPITOR) 10 MG tablet Take 1 tablet (10 mg total) by mouth daily. 30 tablet 11   No current facility-administered medications for this visit.   Facility-Administered Medications Ordered in Other Visits  Medication Dose Route Frequency Provider Last Rate Last Admin  . heparin lock flush 100 UNIT/ML injection               .  PHYSICAL EXAMINATION: ECOG PERFORMANCE STATUS: 1 -  Symptomatic but completely ambulatory  Vitals:   01/09/22 1019  BP: 117/79  Pulse: 78  Temp: 97.8 F (36.6 C)  SpO2: 100%   Filed Weights   01/09/22 1019  Weight: 157 lb 12.8 oz (71.6 kg)     Physical Exam HENT:     Head: Normocephalic and atraumatic.  Mouth/Throat:     Pharynx: No oropharyngeal exudate.  Eyes:     Pupils: Pupils are equal, round, and reactive to light.  Cardiovascular:     Rate and Rhythm: Normal rate and regular rhythm.  Pulmonary:     Effort: No respiratory distress.     Breath sounds: No wheezing.  Abdominal:     General: Bowel sounds are normal. There is no distension.     Palpations: Abdomen is soft. There is no mass.     Tenderness: There is no abdominal tenderness. There is no guarding or rebound.  Musculoskeletal:        General: No tenderness. Normal range of motion.     Cervical back: Normal range of motion and neck supple.  Skin:    General: Skin is warm.     Comments: Bilateral lower extremity ulcerations noted.  Pulses intact bilaterally.  Cystlike lesion noted right elbow-nontender.  No erythema noted.  Neurological:     Mental Status: He is oriented to person, place, and time.     Comments: Sleepy but easily arousable.  Chronic weakness left upper and lower extremity.  Psychiatric:        Mood and Affect: Affect normal.     LABORATORY DATA:  I have reviewed the data as listed Lab Results  Component Value Date   WBC 6.3 01/09/2022   HGB 11.9 (L) 01/09/2022   HCT 37.7 (L) 01/09/2022   MCV 84.2 01/09/2022   PLT 156 01/09/2022   Recent Labs    11/28/21 0831 12/19/21 0852 01/09/22 1004  NA 136 136 136  K 3.9 4.1 4.1  CL 107 105 103  CO2 '24 23 27  ' GLUCOSE 95 117* 115*  BUN '16 21 16  ' CREATININE 0.94 1.13 0.98  CALCIUM 8.3* 8.8* 8.3*  GFRNONAA >60 >60 >60  PROT 7.3 7.8 6.7  ALBUMIN 3.2* 3.7 3.0*  AST 38 32 31  ALT '22 18 18  ' ALKPHOS 111 106 110  BILITOT 0.5 0.6 0.5    RADIOGRAPHIC STUDIES: I have personally  reviewed the radiological images as listed and agreed with the findings in the report. No results found.  ASSESSMENT & PLAN:   Ureteral cancer, right ([place]) # High-grade urothelial cancer/cytology; likely of the right renal pelvis /upper ureter. On keytruda-CT scan- FEB 2023- Stable appearance of substantial low-density filling defect in the right renal collecting system, infundibular, and calices as well as the right proximal ureter, favoring either tumor or blood products.  No obvious progression of disease noted on imaging. STABLE.   # Proceed with  [place] today; Labs today reviewed; Labs today reviewed;  acceptable for treatment today. STABLE. TSH April 2023--WNL.  Again we will plan imaging in June.  Will order  Today.  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  FEB 2023- CT scan- Focal wall thickening in the proximal sigmoid colon corresponding to the location of prior hypermetabolic activity.  I again reviewed with the patient that he is a poor candidate for surgery; s/p evaluation with Dr. Bary Castilla. STABLE.  We will again repeat imaging today.   # Iron deficiency anemia-hemoglobin 11-12; FEB 2023- iron studies-7/ferritin-18 LOW; proceed with venofer  # Hypocalcemia- Mild- on Ca+vit D; awaiting on  25-OH vit D levels.   #DISPOSITION: #  Keytruda today;  # follow up in 3 weeks-;MD labs-cbc/cmp;  Keytruda; Venofer- CT CAP prior-;- Dr.B.     All questions were answered. The patient knows  to call the clinic with any problems, questions or concerns.    Cammie Sickle, MD 01/09/2022 4:43 PM

## 2022-01-09 NOTE — Assessment & Plan Note (Signed)
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 On keytruda-CT scan- FEB 2023- Stable appearance of substantial low-density filling defect in the right renal collecting system, infundibular, and calices as well as the right proximal ureter, favoring either tumor or blood products.  No obvious progression of disease noted on imaging. STABLE.   # Proceed with  [place] today; Labs today reviewed; Labs today reviewed;  acceptable for treatment today. STABLE. TSH April 2023--WNL.  Again we will plan imaging in June.  Will order  Today.  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  FEB 2023- CT scan- Focal wall thickening in the proximal sigmoid colon corresponding to the location of prior hypermetabolic activity.  I again reviewed with the patient that he is a poor candidate for surgery; s/p evaluation with Dr. Bary Castilla. STABLE.  We will again repeat imaging today.   # Iron deficiency anemia-hemoglobin 11-12; FEB 2023- iron studies-7/ferritin-18 LOW; proceed with venofer  # Hypocalcemia- Mild- on Ca+vit D; awaiting on  25-OH vit D levels.   #DISPOSITION: #  Keytruda today;  # follow up in 3 weeks-;MD labs-cbc/cmp;  Keytruda; Venofer- CT CAP prior-;- Dr.B.

## 2022-01-10 ENCOUNTER — Encounter: Payer: Medicaid Other | Admitting: Physician Assistant

## 2022-01-10 DIAGNOSIS — I87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 hypertension (idiopathic) with ulcer and inflammation of left lower extremity: Secondary | ICD-10-CM | POA: Diagnosis not present

## 2022-01-10 NOTE — Progress Notes (Addendum)
LASHON, HILLIER ([id number]) Visit Report for 01/10/2022 Arrival Information Details Patient Name: Chad [name], CHRISTOFFEL Date of Service: 01/10/2022 9:15 AM Medical Record Number: [id number] Patient Account Number: [id number] Date of Birth/Sex: Dec 19, 1960 (61 y.o. M) Treating RN: [name] Primary Care Aemon Koeller: [name] Other Clinician: Referring Riham Polyakov: [name] Treating Alina Gilkey/Extender: Skipper Cliche in Treatment: 5 Visit Information History Since Last Visit Added or deleted any medications: No Patient Arrived: Wheel Chair Any new allergies or adverse reactions: No Arrival Time: 09:26 Had a fall or experienced change in No Accompanied By: self activities of daily living that may affect Transfer Assistance: EasyPivot Patient Lift risk of falls: Patient Identification Verified: Yes Hospitalized since last visit: No Secondary Verification Process Completed: Yes Has Dressing in Place as Prescribed: Yes Patient Requires Transmission-Based No Pain Present Now: No Precautions: Patient Has Alerts: Yes Patient Alerts: Patient on Blood Thinner NOT diabetic aspirin [facility] Signature(s) Signed: 01/10/2022 2:49:43 PM By: GLevora DredgeEntered By: GLevora Dredgeon 01/10/2022 09:32:23 Chad [name]([id number] -------------------------------------------------------------------------------- Clinic Level of Care Assessment Details Patient Name: Chad SabalDate of Service: 01/10/2022 9:15 AM Medical Record Number: [id number] Account Number: [id number] of Birth/Sex: 314-Dec-1962(61 y.o. M) Treating RN: GLevora DredgePrimary Care Keane Martelli: LErik ObeyOther Clinician: Referring Malique Driskill: LErik ObeyTreating Lexiana Spindel/Extender: SSkipper Clichein Treatment: 5 Clinic Level of Care Assessment Items TOOL 1 Quantity Score '[]'  - Use when EandM and Procedure is performed on INITIAL visit 0 ASSESSMENTS - Nursing  Assessment / Reassessment '[]'  - General Physical Exam (combine w/ comprehensive assessment (listed just below) when performed on new 0 pt. evals) '[]'  - 0 Comprehensive Assessment (HX, ROS, Risk Assessments, Wounds Hx, etc.) ASSESSMENTS - Wound and Skin Assessment / Reassessment '[]'  - Dermatologic / Skin Assessment (not related to wound area) 0 ASSESSMENTS - Ostomy and/or Continence Assessment and Care '[]'  - Incontinence Assessment and Management 0 '[]'  - 0 Ostomy Care Assessment and Management (repouching, etc.) PROCESS - Coordination of Care '[]'  - Simple Patient / Family Education for ongoing care 0 '[]'  - 0 Complex (extensive) Patient / Family Education for ongoing care '[]'  - 0 Staff obtains CProgrammer, systems Records, Test Results / Process Orders '[]'  - 0 Staff telephones HHA, Nursing Homes / Clarify orders / etc '[]'  - 0 Routine Transfer to another Facility (non-emergent condition) '[]'  - 0 Routine Hospital Admission (non-emergent condition) '[]'  - 0 New Admissions / IBiomedical engineer/ Ordering NPWT, Apligraf, etc. '[]'  - 0 Emergency Hospital Admission (emergent condition) PROCESS - Special Needs '[]'  - Pediatric / Minor Patient Management 0 '[]'  - 0 Isolation Patient Management '[]'  - 0 Hearing / Language / Visual special needs '[]'  - 0 Assessment of Community assistance (transportation, D/C planning, etc.) '[]'  - 0 Additional assistance / Altered mentation '[]'  - 0 Support Surface(s) Assessment (bed, cushion, seat, etc.) INTERVENTIONS - Miscellaneous '[]'  - External ear exam 0 '[]'  - 0 Patient Transfer (multiple staff / HCivil Service fast streamer/ Similar devices) '[]'  - 0 Simple Staple / Suture removal (25 or less) '[]'  - 0 Complex Staple / Suture removal (26 or more) '[]'  - 0 Hypo/Hyperglycemic Management (do not check if billed separately) '[]'  - 0 Ankle / Brachial Index (ABI) - do not check if billed separately Has the patient been seen at the hospital within the last three years: Yes Total Score: 0 Level Of  Care: ____ Chad [name]([id number] Electronic Signature(s) Signed: 01/10/2022 2:49:43 PM By: GLevora DredgeEntered By: GLevora Dredgeon 01/10/2022 09:47:27 Strozier, Dierre (  [id number]) -------------------------------------------------------------------------------- Encounter Discharge Information Details Patient Name: Chad, [name] Date of Service: 01/10/2022 9:15 AM Medical Record Number: [id number] Patient Account Number: [id number] Date of Birth/Sex: 1960/11/11 (61 y.o. M) Treating RN: [name] Primary Care Melani Brisbane: [name] Other Clinician: Referring Joie Reamer: [name] Treating Hilde Churchman/Extender: Skipper Cliche in Treatment: 5 Encounter Discharge Information Items Post Procedure Vitals Discharge Condition: Stable Temperature (F): 98.3 Ambulatory Status: Wheelchair Pulse (bpm): 87 Discharge Destination: [facility] [place] Respiratory Rate (breaths/min): 18 Telephoned: No Blood Pressure (mmHg): 108/72 Orders Sent: Yes Transportation: Other Accompanied By: self Schedule Follow-up Appointment: Yes Clinical Summary of Care: Electronic Signature(s) Signed: 01/10/2022 10:43:58 AM By: [name] Entered By: [name] on 01/10/2022 10:43:58 [name] ([id number]) -------------------------------------------------------------------------------- Lower Extremity Assessment Details Patient Name: [name] Date of Service: 01/10/2022 9:15 AM Medical Record Number: [id number] Patient Account Number: [id number] Date of Birth/Sex: Oct 24, 1960 (61 y.o. M) Treating RN: [name] Primary Care Consepcion Utt: [name] Other Clinician: Referring Oaklyn Mans: [name] Treating Viriginia Amendola/Extender: Jeri Cos Weeks in Treatment: 5 Edema Assessment Assessed: [Left: No] [Right: No] Edema: [Left: Ye] [Right: s] Calf Left: Right: Point of Measurement: 31 cm From Medial Instep 33.7 cm Ankle Left: Right: Point of Measurement: 11 cm  From Medial Instep 22 cm Vascular Assessment Pulses: Dorsalis Pedis Palpable: [Left:Yes] Electronic Signature(s) Signed: 01/10/2022 2:49:43 PM By: [name] Entered By: [name] on 01/10/2022 09:41:16 [name] ([id number]) -------------------------------------------------------------------------------- Multi Wound Chart Details Patient Name: [name] Date of Service: 01/10/2022 9:15 AM Medical Record Number: [id number] Patient Account Number: [id number] Date of Birth/Sex: [birth year] (61 y.o. M) Treating RN: [name] Primary Care Falon Flinchum: [name] Other Clinician: Referring Mackinzie Vuncannon: [name] Treating Amelia Macken/Extender: Skipper Cliche in Treatment: 5 Vital Signs Height(in): 16 Pulse(bpm): 71 Weight(lbs): 150 Blood Pressure(mmHg): 108/72 Body Mass Index(BMI): 23.5 Temperature(F): 98.3 Respiratory Rate(breaths/min): 18 Photos: [N/A:N/A] Wound Location: Left, Proximal, Medial Ankle Left, Distal, Medial Ankle N/A Wounding Event: Gradually Appeared Gradually Appeared N/A Primary Etiology: Venous Leg Ulcer Venous Leg Ulcer N/A Comorbid History: Anemia, Chronic Obstructive Anemia, Chronic Obstructive N/A Pulmonary Disease (COPD), Pulmonary Disease (COPD), Coronary Artery Disease, Peripheral Coronary Artery Disease, Peripheral Arterial Disease, Peripheral Venous Arterial Disease, Peripheral Venous Disease, Hepatitis C, Osteoarthritis, Disease, Hepatitis C, Osteoarthritis, Neuropathy, Received Neuropathy, Received Chemotherapy Chemotherapy Date Acquired: 11/06/2021 07/12/2019 N/A Weeks of Treatment: 5 5 N/A Wound Status: Open Open N/A Wound Recurrence: No No N/A Measurements L x W x D (cm) 1.3x1.2x0.1 3.7x2x0.1 N/A Area (cm) : 1.225 5.812 N/A Volume (cm) : 0.123 0.581 N/A % Reduction in Area: 69.10% 15.90% N/A % Reduction in Volume: 68.90% 58.00% N/A Classification: Full Thickness Without Exposed Full Thickness Without Exposed  N/A Support Structures Support Structures Exudate Amount: Medium Medium N/A Exudate Type: Serosanguineous Serosanguineous N/A Exudate Color: red, brown red, brown N/A Granulation Amount: Medium (34-66%) Medium (34-66%) N/A Granulation Quality: Pink Pink N/A Necrotic Amount: Medium (34-66%) Medium (34-66%) N/A Exposed Structures: Fat Layer (Subcutaneous Tissue): Fat Layer (Subcutaneous Tissue): N/A Yes Yes Fascia: No Fascia: No Tendon: No Tendon: No Muscle: No Muscle: No Joint: No Joint: No Bone: No Bone: No Epithelialization: Small (1-33%) Small (1-33%) N/A Treatment Notes Electronic Signature(s) Signed: 01/10/2022 2:49:43 PM By: Morrison Old, [name] ([id number]) Entered By: [name] on 01/10/2022 09:44:07 [name] ([id number]) -------------------------------------------------------------------------------- Multi-Disciplinary Care Plan Details Patient Name: [name] Date of Service: 01/10/2022 9:15 AM Medical Record Number: [id number] Patient Account Number: [id number] Date of Birth/Sex: 08-15-1960 (61 y.o. M) Treating RN: [name] Primary Care Saabir Blyth: [name] Other Clinician:  Referring Hamsini Verrilli: [name] Treating Dara Beidleman/Extender: Skipper Cliche in Treatment: 5 Active Inactive Venous Leg Ulcer Nursing Diagnoses: Knowledge deficit related to disease process and management Goals: Patient will maintain optimal edema control Date Initiated: 12/06/2021 Date Inactivated: 12/27/2021 Target Resolution Date: 01/03/2022 Goal Status: Met Patient/caregiver will verbalize understanding of disease process and disease management Date Initiated: 12/06/2021 Target Resolution Date: 01/03/2022 Goal Status: Active Interventions: Assess peripheral edema status every visit. Compression as ordered Notes: Electronic Signature(s) Signed: 01/10/2022 2:49:43 PM By: [name] Entered By: [name] on 01/10/2022  09:43:59 [name] ([id number]) -------------------------------------------------------------------------------- Pain Assessment Details Patient Name: [name] Date of Service: 01/10/2022 9:15 AM Medical Record Number: [id number] Patient Account Number: [id number] Date of Birth/Sex: [birth year] (61 y.o. M) Treating RN: [name] Primary Care Brytani Voth: [name] Other Clinician: Referring Dulcinea Kinser: [name] Treating Phinley Schall/Extender: Skipper Cliche in Treatment: 5 Active Problems Location of Pain Severity and Description of Pain Patient Has Paino No Site Locations Rate the pain. Current Pain Level: 0 Pain Management and Medication Current Pain Management: Electronic Signature(s) Signed: 01/10/2022 2:49:43 PM By: [name] Entered By: [name] on 01/10/2022 09:32:47 [name] ([id number]) -------------------------------------------------------------------------------- Patient/Caregiver Education Details Patient Name: [name] Date of Service: 01/10/2022 9:15 AM Medical Record Number: [id number] Patient Account Number: [id number] Date of Birth/Gender: [birth year] (61 y.o. M) Treating RN: [name] Primary Care Physician: [name] Other Clinician: Referring Physician: [name] Treating Physician/Extender: Skipper Cliche in Treatment: 5 Education Assessment Education Provided To: Patient Education Topics Provided Wound Debridement: Handouts: Wound Debridement Methods: Explain/Verbal Responses: State content correctly Wound/Skin Impairment: Handouts: Caring for Your Ulcer Methods: Explain/Verbal Responses: State content correctly Electronic Signature(s) Signed: 01/10/2022 2:49:43 PM By: [name] Entered By: [name] on 01/10/2022 09:47:45 [name] ([id number]) -------------------------------------------------------------------------------- Wound Assessment Details Patient Name:  [name] Date of Service: 01/10/2022 9:15 AM Medical Record Number: [id number] Patient Account Number: [id number] Date of Birth/Sex: 02-Jan-1961 (61 y.o. M) Treating RN: [name] Primary Care Keria Widrig: [name] Other Clinician: Referring Renny Remer: [name] Treating Caileb Rhue/Extender: Jeri Cos Weeks in Treatment: 5 Wound Status Wound Number: 11 Primary Venous Leg Ulcer Etiology: Wound Location: Left, Proximal, Medial Ankle Wound Open Wounding Event: Gradually Appeared Status: Date Acquired: 11/06/2021 Comorbid Anemia, Chronic Obstructive Pulmonary Disease (COPD), Weeks Of Treatment: 5 History: Coronary Artery Disease, Peripheral Arterial Disease, Clustered Wound: No Peripheral Venous Disease, Hepatitis C, Osteoarthritis, Neuropathy, Received Chemotherapy Photos Wound Measurements Length: (cm) 1.3 Width: (cm) 1.2 Depth: (cm) 0.1 Area: (cm) 1.225 Volume: (cm) 0.123 % Reduction in Area: 69.1% % Reduction in Volume: 68.9% Epithelialization: Small (1-33%) Tunneling: No Undermining: No Wound Description Classification: Full Thickness Without Exposed Support Structu Exudate Amount: Medium Exudate Type: Serosanguineous Exudate Color: red, brown res Foul Odor After Cleansing: No Slough/Fibrino Yes Wound Bed Granulation Amount: Medium (34-66%) Exposed Structure Granulation Quality: Pink Fascia Exposed: No Necrotic Amount: Medium (34-66%) Fat Layer (Subcutaneous Tissue) Exposed: Yes Necrotic Quality: Adherent Slough Tendon Exposed: No Muscle Exposed: No Joint Exposed: No Bone Exposed: No Treatment Notes Wound #11 (Ankle) Wound Laterality: Left, Medial, Proximal Cleanser Soap and Water Discharge Instruction: Gently cleanse wound with antibacterial soap, rinse and pat dry prior to dressing wounds Wound Cleanser ZACHARIAH, PAVEK ([id number]) Discharge Instruction: Wash your hands with soap and water. Remove old dressing, discard into plastic bag and  place into trash. Cleanse the wound with Wound Cleanser prior to applying a clean dressing using gauze sponges, not tissues or cotton balls. Do not scrub or use excessive force. Pat dry using  gauze sponges, not tissue or cotton balls. Peri-Wound Care Topical Gentamicin Discharge Instruction: Apply as directed by Tyquasia Pant. Primary Dressing Hydrofera Blue Ready Transfer Foam, 2.5x2.5 (in/in) Discharge Instruction: Apply Hydrofera Blue Ready to wound bed as directed Secondary Dressing ABD Pad 5x9 (in/in) Discharge Instruction: Cover with ABD pad Secured With [place] H Soft Cloth Surgical Tape, 2x2 (in/yd) Kerlix Roll Sterile or Non-Sterile 6-ply 4.5x4 (yd/yd) Discharge Instruction: Apply Kerlix as directed Compression Wrap Compression Stockings Add-Ons Electronic Signature(s) Signed: 01/10/2022 2:49:43 PM By: [name] Entered By: [name] on 01/10/2022 09:39:59 [name] ([id number]) -------------------------------------------------------------------------------- Wound Assessment Details Patient Name: [name] Date of Service: 01/10/2022 9:15 AM Medical Record Number: [id number] Patient Account Number: [id number] Date of Birth/Sex: 09/09/1960 (61 y.o. M) Treating RN: [name] Primary Care Ferrin Liebig: [name] Other Clinician: Referring Collin Hendley: [name] Treating Mellie Buccellato/Extender: Jeri Cos Weeks in Treatment: 5 Wound Status Wound Number: 12 Primary Venous Leg Ulcer Etiology: Wound Location: Left, Distal, Medial Ankle Wound Open Wounding Event: Gradually Appeared Status: Date Acquired: 07/12/2019 Comorbid Anemia, Chronic Obstructive Pulmonary Disease (COPD), Weeks Of Treatment: 5 History: Coronary Artery Disease, Peripheral Arterial Disease, Clustered Wound: No Peripheral Venous Disease, Hepatitis C, Osteoarthritis, Neuropathy, Received Chemotherapy Photos Wound Measurements Length: (cm) 3.7 Width: (cm)  2 Depth: (cm) 0.1 Area: (cm) 5.812 Volume: (cm) 0.581 % Reduction in Area: 15.9% % Reduction in Volume: 58% Epithelialization: Small (1-33%) Tunneling: No Undermining: No Wound Description Classification: Full Thickness Without Exposed Support Structu Exudate Amount: Medium Exudate Type: Serosanguineous Exudate Color: red, brown res Foul Odor After Cleansing: No Slough/Fibrino Yes Wound Bed Granulation Amount: Medium (34-66%) Exposed Structure Granulation Quality: Pink Fascia Exposed: No Necrotic Amount: Medium (34-66%) Fat Layer (Subcutaneous Tissue) Exposed: Yes Necrotic Quality: Adherent Slough Tendon Exposed: No Muscle Exposed: No Joint Exposed: No Bone Exposed: No Treatment Notes Wound #12 (Ankle) Wound Laterality: Left, Medial, Distal Cleanser Soap and Water Discharge Instruction: Gently cleanse wound with antibacterial soap, rinse and pat dry prior to dressing wounds Wound Cleanser HUTTON, PELLICANE ([id number]) Discharge Instruction: Wash your hands with soap and water. Remove old dressing, discard into plastic bag and place into trash. Cleanse the wound with Wound Cleanser prior to applying a clean dressing using gauze sponges, not tissues or cotton balls. Do not scrub or use excessive force. Pat dry using gauze sponges, not tissue or cotton balls. Peri-Wound Care Topical Gentamicin Discharge Instruction: Apply as directed by Jaeger Trueheart. Primary Dressing Hydrofera Blue Ready Transfer Foam, 2.5x2.5 (in/in) Discharge Instruction: Apply Hydrofera Blue Ready to wound bed as directed Secondary Dressing ABD Pad 5x9 (in/in) Discharge Instruction: Cover with ABD pad Secured With [place] H Soft Cloth Surgical Tape, 2x2 (in/yd) Kerlix Roll Sterile or Non-Sterile 6-ply 4.5x4 (yd/yd) Discharge Instruction: Apply Kerlix as directed Compression Wrap Compression Stockings Add-Ons Electronic Signature(s) Signed: 01/10/2022 2:49:43 PM By: [name] Entered By: [name] on 01/10/2022 09:40:49 [name] ([id number]) -------------------------------------------------------------------------------- Bliss Details Patient Name: [name] Date of Service: 01/10/2022 9:15 AM Medical Record Number: [id number] Patient Account Number: [id number] Date of Birth/Sex: Jul 19, 1961 (61 y.o. M) Treating RN: [name] Primary Care Jesilyn Easom: [name] Other Clinician: Referring Timiyah Romito: [name] Treating Ronya Gilcrest/Extender: Skipper Cliche in Treatment: 5 Vital Signs Time Taken: 09:30 Temperature (F): 98.3 Height (in): 67 Pulse (bpm): 87 Weight (lbs): 150 Respiratory Rate (breaths/min): 18 Body Mass Index (BMI): 23.5 Blood Pressure (mmHg): 108/72 Reference Range: 80 - 120 mg / dl Electronic Signature(s) Signed: 01/10/2022 2:49:43 PM By: [name] Entered  By: [name] on 01/10/2022 09:32:38

## 2022-01-10 NOTE — Progress Notes (Addendum)
Chad [name] ([id number]) Visit Report for 01/10/2022 Chief Complaint Document Details Patient Name: Chad [name], Chad [name] Date of Service: 01/10/2022 9:15 AM Medical Record Number: [id number] Patient Account Number: [id number] Date of Birth/Sex: 1960/12/09 (61 y.o. M) Treating RN: [name] Primary Care Provider: [name] Other Clinician: Referring Provider: [name] Treating Provider/Extender: Skipper Cliche in Treatment: 5 Information Obtained from: Patient Chief Complaint Left ankle ulcers Electronic Signature(s) Signed: 01/10/2022 9:32:04 AM By: Worthy Keeler PA-C Entered By: Worthy Keeler on 01/10/2022 09:32:03 Chad [name] ([id number]) -------------------------------------------------------------------------------- Debridement Details Patient Name: Chad [name] Date of Service: 01/10/2022 9:15 AM Medical Record Number: [id number] Patient Account Number: [id number] Date of Birth/Sex: 04/24/1961 (61 y.o. M) Treating RN: [name] Primary Care Provider: [name] Other Clinician: Referring Provider: [name] Treating Provider/Extender: Skipper Cliche in Treatment: 5 Debridement Performed for Wound #11 Left,Proximal,Medial Ankle Assessment: Performed By: Physician Tommie Sams., PA-C Debridement Type: Debridement Severity of Tissue Pre Debridement: Fat layer exposed Level of Consciousness (Pre- Awake and Alert procedure): Pre-procedure Verification/Time Out Yes - 09:43 Taken: Total Area Debrided (L x W): 1.3 (cm) x 1.2 (cm) = 1.56 (cm) Tissue and other material Viable, Non-Viable, Slough, Subcutaneous, Slough debrided: Level: Skin/Subcutaneous Tissue Debridement Description: Excisional Instrument: Curette Bleeding: Minimum Hemostasis Achieved: Pressure Response to Treatment: Procedure was tolerated well Level of Consciousness (Post- Awake and Alert procedure): Post Debridement Measurements of Total Wound Length: (cm)  1.3 Width: (cm) 1.2 Depth: (cm) 0.1 Volume: (cm) 0.123 Character of Wound/Ulcer Post Debridement: Stable Severity of Tissue Post Debridement: Fat layer exposed Post Procedure Diagnosis Same as Pre-procedure Electronic Signature(s) Signed: 01/10/2022 2:49:43 PM By: [name] Signed: 01/10/2022 3:32:08 PM By: Worthy Keeler PA-C Entered By: [name] on 01/10/2022 09:44:56 Chad [name] ([id number]) -------------------------------------------------------------------------------- Debridement Details Patient Name: Chad [name] Date of Service: 01/10/2022 9:15 AM Medical Record Number: [id number] Patient Account Number: [id number] Date of Birth/Sex: 02/27/1961 (61 y.o. M) Treating RN: [name] Primary Care Provider: [name] Other Clinician: Referring Provider: [name] Treating Provider/Extender: Skipper Cliche in Treatment: 5 Debridement Performed for Wound #12 Left,Distal,Medial Ankle Assessment: Performed By: Physician Tommie Sams., PA-C Debridement Type: Debridement Severity of Tissue Pre Debridement: Fat layer exposed Level of Consciousness (Pre- Awake and Alert procedure): Pre-procedure Verification/Time Out Yes - 09:45 Taken: Total Area Debrided (L x W): 3.7 (cm) x 2 (cm) = 7.4 (cm) Tissue and other material Viable, Non-Viable, Slough, Subcutaneous, Slough debrided: Level: Skin/Subcutaneous Tissue Debridement Description: Excisional Instrument: Curette Bleeding: Moderate Hemostasis Achieved: Pressure Response to Treatment: Procedure was tolerated well Level of Consciousness (Post- Awake and Alert procedure): Post Debridement Measurements of Total Wound Length: (cm) 3.7 Width: (cm) 2 Depth: (cm) 0.1 Volume: (cm) 0.581 Character of Wound/Ulcer Post Debridement: Stable Severity of Tissue Post Debridement: Fat layer exposed Post Procedure Diagnosis Same as Pre-procedure Electronic Signature(s) Signed: 01/10/2022 2:49:43 PM  By: [name] Signed: 01/10/2022 3:32:08 PM By: Worthy Keeler PA-C Entered By: [name] on 01/10/2022 09:45:37 Chad [name] ([id number]) -------------------------------------------------------------------------------- HPI Details Patient Name: Chad [name] Date of Service: 01/10/2022 9:15 AM Medical Record Number: [id number] Patient Account Number: [id number] Date of Birth/Sex: 09/24/1960 (61 y.o. M) Treating RN: [name] Primary Care Provider: [name] Other Clinician: Referring Provider: [name] Treating Provider/Extender: Skipper Cliche in Treatment: 5 History of Present Illness HPI Description: 10/08/18 on evaluation today patient actually presents to our office for initial evaluation concerning wounds that he has of the bilateral lower extremities. 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 He also has bilateral venous stasis. He apparently has been homeless currently following discharge from the hospital apparently he has been placed at [facility] which is is a skilled nursing facility locally. Nonetheless fortunately he does not show any signs of infection at this time which is good news. In fact several of the wound actually appears to be showing some signs of improvement already in my pinion. There are a couple areas in the left leg in particular there likely gonna require some sharp debridement to help clear away some necrotic tissue and help with more sufficient healing. No fevers, chills, nausea, or vomiting noted at this time. 10/15/18 on evaluation today patient actually appears to be doing very well in regard to his bilateral lower extremities. He's been tolerating the dressing changes without complication. Fortunately there does not appear to be any evidence of active infection  at this time which is great news. Overall I'm actually very pleased with how this has progressed in just one visits time. Readmission: 08/14/2020 upon evaluation today patient presents for re-evaluation here in our clinic. He is having issues with his left ankle region as well as his right toe and his right heel. He tells me that the toe and heel actually began as a area that was itching that he was scratching and then subsequently opened up into wounds. These may have been abscess areas I presume based on what I am seeing currently. With regard to his left ankle region he tells me this was a similar type occurrence although he does have venous stasis this very well may be more of a venous leg ulcer more than anything. Nonetheless I do believe that the patient would benefit from appropriate and aggressive wound care to try to help get things under better control here. He does have history of a stroke on the left side affecting him to some degree there that he is able to stand although he does have some residual weakness. Otherwise again the patient does have chronic venous insufficiency as previously noted. His arterial studies most recently obtained showed that he had an ABI on the right of 1.16 with a TBI of 0.52 and on the left and ABI of 1.14 with a TBI of 0.81. That was obtained on 06/19/2020. 08/28/2020 upon evaluation today patient appears to be doing decently well in regard to his wounds in general. He has been tolerating the dressing changes without complication. Fortunately there does not appear to be any signs of active infection which is great news. With that being said I think the [facility] is doing a good job I would recommend that we likely continue with that currently. 09/11/2020 upon evaluation today patient's wounds did not appear to be doing too poorly but again he is not really showing signs of significant improvement with regard to any of the wounds on the right. None of them  have Hydrofera Blue on them I am not exactly sure why this is not being followed as the facility did not contact us to let us know of any issues with obtaining dressings or otherwise. With that being said he is supposed to be using Hydrofera Blue on both of the wounds on the right foot as well as the ankle wound on the left side. 09/18/2020 upon evaluation today patient appears to be doing poorly with regard to his wounds.  Again right now the left ankle in particular showed signs of extreme maceration. Apparently he was told by someone with staff at [facility] they could not get the [facility]. With that being said this is something that is never been relayed to [place] one way or another. Also the patient subsequently has not supposed to have a border gauze dressing on. He should have an ABD pad and roll gauze to secure as this drains much too much just to have a border gauze dressing to cover. Nonetheless the fact that they are not using the appropriate dressing is directly causing deterioration of the left ankle wound it is significantly worse today compared to what it was previous. I did attempt to call [facility] while the patient was here I called three times and got no one to even pick up the phone. After this I had my for an office coordinator call and she was able to finally get through and leave a message with the D ON as of dictation of this note which is roughly about an hour and a half later I still have not been able to speak with anyone at the facility. 09/25/2020 upon evaluation today patient actually showing signs of good improvement which is excellent news. He has been tolerating the dressing changes without complication. Fortunately there is no signs of active infection which is great news. No fevers, chills, nausea, vomiting, or diarrhea. I do feel like the facility has been doing a much better job at taking care of him as far as the dressings are concerned. However  the director of nursing never did call me back. 10/09/2020 upon evaluation today patient appears to be doing well with regard to his wound. The toe ulcer did require some debridement but the other 2 areas actually appear to be doing quite well. 10/19/2020 upon evaluation today patient actually appears to be doing very well in regard to his wounds. In fact the heel does appear to be completely healed. The toe is doing better in the medial ankle on the left is also doing better. Overall I think he is headed in the right direction. 10/26/2020 upon evaluation today patient appears to be doing well with regard to his wound. He is showing signs of improvement which is great news and overall I am very pleased with where things stand today. No fevers, chills, nausea, vomiting, or diarrhea. 11/02/2020 upon evaluation today patient appears to be doing well with regard to his wounds. He has been tolerating the dressing changes without complication overall I am extremely pleased with where things stand today. He in regard to the toe is almost completely healed and the medial ankle on the left is doing much better. 11/09/2020 upon evaluation today patient appears to be doing a little poorly in regard to his left medial ankle ulcer. Fortunately there does not appear to be any signs of systemic infection but unfortunately locally he does appear to be infected in fact he has blue-green drainage consistent with Pseudomonas. AMELIO, BROSKY ([id number]) 11/16/2020 upon evaluation today patient appears to be doing well with regard to his wound. It actually appears to be doing better. I did place him on gentamicin cream since the Cipro was actually resistant even though he was positive for Pseudomonas on culture. Overall I think that he does seem to be doing better though I am unsure whether or not they have actually been putting the cream on. The patient is not sure that we did talk to the nurse  directly and she was going to  initiate that treatment. Fortunately there does not appear to be any signs of active infection at this time. No fevers, chills, nausea, vomiting, or diarrhea. 4/28; the area on the right second toe is close to healed. Left medial ankle required debridement 12/07/2020 upon evaluation today patient appears to be doing well with regard to his wounds. In fact the right second toe appears to be completely healed which is great news. Fortunately there does not appear to be any signs of active infection at this time which is also great news. I think we can probably discontinue the gentamicin on top of everything else. 12/14/2020 upon evaluation today patient appears to be doing well with regard to his wound. He is making good progress and overall very pleased with where things stand today. There is no signs of active infection at this time which is great news. 12/28/2020 upon evaluation today patient appears to be doing well with regard to his wounds. He has been tolerating the dressing changes without complication. Fortunately there is no signs of active infection at this time. No fevers, chills, nausea, vomiting, or diarrhea. 12/28/2020 upon evaluation today patient's wound bed actually showed signs of excellent improvement. He has great epithelization and granulation I do not see any signs of infection overall I am extremely pleased with where things stand at this point. No fevers, chills, nausea, vomiting, or diarrhea. 01/11/2021 upon evaluation today patient appears to be doing well with regard to his wound on his leg. He has been tolerating the dressing changes without complication. Fortunately there does not appear to be any signs of active infection which is great news. No fevers, chills, nausea, vomiting, or diarrhea. 01/25/2021 upon evaluation today patient appears to be doing well with regard to his wound. He has been tolerating the dressing changes without complication. Fortunately the collagen seems to  be doing a great job which is excellent news. No fevers, chills, nausea, vomiting, or diarrhea. 02/08/2021 upon evaluation today patient's wound is actually looking a little bit worse especially in the periwound compared to previous. Fortunately there does not appear to be any signs of infection which is great news with that being said he does have some irritation around the periphery of the wound which has me more concerned. He actually had a dressing on that had not been changed in 3 days. He also is supposed to have daily dressing changes. With regard to the dressing applied he had a silver alginate dressing and silver collagen is what is recommended and ordered. He also had no Desitin around the edges of the wound in the periwound region although that is on the order inspect to be done as well. In general I was very concerned I did contact [facility] actually spoke with Magda Paganini who is the scheduling individual and subsequently she stated that she would pass the information to the D ON apparently the D ON was not available to talk to me when I call today. 02/18/2021 upon evaluation today patient's wound is actually showing signs of improvement. Fortunately there does not appear to be any evidence of infection which is great news overall I am extremely pleased with where things stand today. No fevers, chills, nausea, vomiting, or diarrhea. 8/3; patient presents for 1 week follow-up. He has no issues or complaints today. He denies signs of infection. 03/11/2021 upon evaluation today patient appears to be doing well with regard to his wound. He does have a little bit of slough  noted on the surface of the wound but fortunately there does not appear to be any signs of active infection at this time. No fevers, chills, nausea, vomiting, or diarrhea. 03/18/2021 upon evaluation today patient appears to be doing well with regard to his wound. He has been tolerating the dressing changes  without complication. There was a little irritation more proximal to where the wound was that was not noted last week but nonetheless this is very superficial just seems to be more irritation we just need to make sure to put a good amount of the zinc over the area in my opinion. Otherwise he does not seem to be doing significantly worse at all which is great news. 03/25/2021 upon evaluation today patient appears to be doing well with regard to his wound. He is going require some sharp debridement today to clear with some of the necrotic debris. I did perform this today without complication postdebridement wound bed appears to be doing much better this is great news. 04/08/2021 upon evaluation today patient appears to be doing decently well in regard to his wound although the overall measurement is not significantly smaller compared to previous. It is gone down a little bit but still the facility continues to not really put the appropriate dressings in place in fact he was supposed to have collagen we think he probably had more of an allergy to At this point. Fortunately there does not appear to be any signs of active infection systemically though locally I do not see anything on initial visualization either as far as erythema or warmth. 04/15/2021 upon evaluation today patient appears to be doing well with regard to his wound. He is actually showing signs of improvement. I did place him on antibiotics last week, Cipro. He has been taking that 2 times a day and seems to be tolerating it very well. I do not see any evidence of worsening and in fact the overall appearance of the wound is smaller today which is also great news. 9/26; left medial ankle chronic venous insufficiency wound is improved. Using Hydrofera Blue 10/10; left medial ankle chronic venous insufficiency. Wound has not changed much in appearance completely nonviable surface. Apparently there have been problems getting the right product on  the wound at the facility although he came in with [facility] on today 05/14/2021 upon evaluation today patient appears to be doing well with regard to his wound. I think he is making progress here which is good news. Fortunately there does not appear to be any signs of active infection at this time. No fevers, chills, nausea, vomiting, or diarrhea. 05/20/2021 upon evaluation today patient appears to be doing well with regard to his wound. He is showing signs of good improvement which is great news. There does not appear to be any evidence of active infection which is also excellent news. No fevers, chills, nausea, vomiting, or diarrhea. 05/28/2021 upon evaluation today patient appears to be doing quite well. There does not appear to be any signs of active infection at this time which is great news. Overall I am extremely pleased with where things stand today. I think he is headed in the right direction. 06/11/2021 upon evaluation today patient appears to be doing well with regard to his left ankle ulcer and poorly in regard to the toe ulcer on the second toe right foot. This appears to show signs of joint exposure. Apparently this has been present for 1 to 2 months although he kept [facility], Kathan ([id number]) forgetting  to tell me about it. That is unfortunate as right now it definitely appears to be doing significantly worse than what I would like to see. There does not appear to be any signs of active infection systemically though locally I am concerned about the possibility of infection the toe is quite red. Again no one from the facility ever contacted [place] to advise that this was going on in the interim either. 06/17/2021 upon evaluation today patient presents for follow-up I did review his x-ray which showed a navicular bone fracture I am unsure of the chronicity of this. Subsequently he also had osteomyelitis of the toe which was what I was more concerned about this did not show up on  x-ray but did show up on the pathology scrapings. This was listed as acute osteomyelitis. Nonetheless at this point I think that the antibiotic treatment is the best regimen to go with currently. The patient is in agreement with that plan. Nonetheless he has initially 30 days of doxycycline off likely extend that towards the end of the treatment cycle that will be around the middle of December for an additional 2 weeks. That all depends on how well he continues to heal. Nonetheless based on what I am seeing in the foot I did want a proceed with an MRI as well which I think will be helpful to identify if there is anything else that needs to be addressed from the standpoint of infection. 06/24/2021 upon evaluation today patient appears to be doing pretty well in regards to his wounds. I think both are actually showing signs of improvement which is good I did review his MRI today which did show signs of osteomyelitis of the middle and proximal phalanx on his right foot of the affected toe. With that being said this is actually showing signs of significant improvement today already with the antibiotic therapy I think the redness is also improved. Overall I think that we just need to give this some time with appropriate wound care we will see how things go potentially hyperbarics could be considered. 07/02/2021 upon inspection today patient actually appears to be doing well in regard to his left ankle which is getting very close to complete resolution of pleased in that regard. Unfortunately he is continuing to have issues with his second toe right foot and this seems to still be very painful for him. Recommend he try something different from the standpoint of antibiotics. 07/15/2021 upon evaluation today patient appears to be doing actually pretty well in regard to his foot. This is actually showing signs of significant improvement which is great news. Overall I feel like the patient is improving both in  regard to the second toe as well as the ankle on the left. With that being said the biggest issue that I do see currently is that he is needing to have a refill of the doxycycline that we previously treated him with. He also did see podiatry they are not going to recommend any amputation at this point since he seems to be doing quite well. For that reason we just need to keep things under control from an infection standpoint. 08/01/2021 upon evaluation today patient appears to be doing well with regard to his wound. He has been tolerating the dressing changes without complication. Fortunately there does not appear to be any evidence of active infection locally nor systemically at this point. In fact I think everything is doing excellent in fact his second toe on the right foot is  almost healed and the ankle on the left ankle region is actually very close to being healed as well. 08/08/2021 upon evaluation today patient appears to be doing well with regard to his wound. He has been tolerating the dressing changes without complication. Fortunately I do not see any signs of active infection at this time. Readmission: 12-06-2021 upon evaluation today patient presents for reevaluation here in the clinic he does tell me that he was being seen in facility at [facility] by a provider that was coming in. He is not sure who this was. He tells me however that the wound seems to have gotten worse even compared to where it was when we last saw him at this point. With that being said I do believe that he is likely going need ongoing wound care here in the clinic and I do believe that we need to be the ones to frontline this since his wound does seem to be getting worse not better at this point. He voiced understanding. He is also in agreement with this plan and feels more comfortable coming here she tells me. Patient's medical history really has not changed since his prior admission he was only gone since  January. 12-27-2021 upon evaluation today patient appears to be doing well with regard to his wound they did run out of the [facility] so they did not put anything on just an ABD pad with gentamicin. Still we are seeing some signs of good improvement here with some new epithelization which is great news. 01-10-2022 upon evaluation today patient appears to be doing well with regard to his wounds and he is going require some sharp debridement but overall seems to be making good progress. Fortunately I do not see any evidence of active infection locally or systemically at this time which is great news. Electronic Signature(s) Signed: 01/10/2022 9:49:34 AM By: Worthy Keeler PA-C Entered By: Worthy Keeler on 01/10/2022 09:49:33 Chad [name] ([id number]) -------------------------------------------------------------------------------- Physical Exam Details Patient Name: Chad [name] Date of Service: 01/10/2022 9:15 AM Medical Record Number: [id number] Patient Account Number: [id number] Date of Birth/Sex: [birth year] (61 y.o. M) Treating RN: [name] Primary Care Provider: [name] Other Clinician: Referring Provider: [name] Treating Provider/Extender: Jeri Cos Weeks in Treatment: 5 Constitutional Well-nourished and well-hydrated in no acute distress. Respiratory normal breathing without difficulty. Psychiatric this patient is able to make decisions and demonstrates good insight into disease process. Alert and Oriented x 3. pleasant and cooperative. Notes Upon inspection patient's wound bed showed signs of good granulation and epithelization at this point. Fortunately I do not see any evidence of anything worsening significantly which is great news and overall I am extremely pleased with where we stand today. I did perform debridement of clearway slough and necrotic debris down to good subcutaneous tissue patient tolerated that today without  complication. Electronic Signature(s) Signed: 01/10/2022 9:49:55 AM By: Worthy Keeler PA-C Entered By: Worthy Keeler on 01/10/2022 09:49:55 Chad [name] ([id number]) -------------------------------------------------------------------------------- Physician Orders Details Patient Name: Chad [name] Date of Service: 01/10/2022 9:15 AM Medical Record Number: [id number] Patient Account Number: [id number] Date of Birth/Sex: 27-Nov-1960 (61 y.o. 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 Bathing/ Shower/ Hygiene o Wash wounds with antibacterial soap and water. o No tub bath. Wound Treatment Wound #11 - Ankle Wound Laterality: Left, Medial, Proximal Cleanser: Soap and Water 3 x Per Week/30 Days Discharge Instructions: Gently cleanse wound with antibacterial soap, rinse and pat dry prior to dressing wounds Cleanser: Wound Cleanser 3 x Per Week/30 Days Discharge Instructions: Wash your hands with soap and water. Remove old dressing, discard into plastic bag and place into trash. Cleanse the wound with Wound Cleanser prior to applying a clean dressing using gauze sponges, not tissues or cotton balls. Do not scrub or use excessive force. Pat dry using gauze sponges, not tissue or cotton balls. Topical: Gentamicin 3 x Per Week/30 Days Discharge Instructions: Apply as directed by provider. Primary Dressing: Hydrofera Blue Ready Transfer Foam, 2.5x2.5  (in/in) 3 x Per Week/30 Days Discharge Instructions: Apply Hydrofera Blue Ready to wound bed as directed Secondary Dressing: ABD Pad 5x9 (in/in) 3 x Per Week/30 Days Discharge Instructions: Cover with ABD pad Secured With: Medipore Tape - 56M Medipore H Soft Cloth Surgical Tape, 2x2 (in/yd) 3 x Per Week/30 Days Secured With: [employer] Sterile or Non-Sterile 6-ply 4.5x4 (yd/yd) 3 x Per Week/30 Days Discharge Instructions: Apply Kerlix as directed Wound #12 - Ankle Wound Laterality: Left, Medial, Distal Cleanser: Soap and Water 3 x Per Week/30 Days Discharge Instructions: Gently cleanse wound with antibacterial soap, rinse and pat dry prior to dressing wounds Cleanser: Wound Cleanser 3 x Per Week/30 Days Discharge Instructions: Wash your hands with soap and water. Remove old dressing, discard into plastic bag and place into trash. Cleanse the wound with Wound Cleanser prior to applying a clean dressing using gauze sponges, not tissues or cotton balls. Do not scrub or use excessive force. Pat dry using gauze sponges, not tissue or cotton balls. Topical: Gentamicin 3 x Per Week/30 Days Discharge Instructions: Apply as directed by provider. Primary Dressing: Hydrofera Blue Ready Transfer Foam, 2.5x2.5 (in/in) 3 x Per Week/30 Days Discharge Instructions: Apply Hydrofera Blue Ready to wound bed as directed Secondary Dressing: ABD Pad 5x9 (in/in) 3 x Per Week/30 Days Discharge Instructions: Cover with ABD pad HENSON, FRATICELLI ([id number]) Secured With: [place] [employer] Tape, 2x2 (in/yd) 3 x Per Week/30 Days Secured With: [employer] Sterile or Non-Sterile 6-ply 4.5x4 (yd/yd) 3 x Per Week/30 Days Discharge Instructions: Apply Kerlix as directed Electronic Signature(s) Signed: 01/10/2022 2:49:43 PM By: [name] Signed: 01/10/2022 3:32:08 PM By: Worthy Keeler PA-C Entered By: [name] on 01/10/2022 09:47:08 Chad [name]  ([id number]) -------------------------------------------------------------------------------- Problem List Details Patient Name: Chad [name] Date of Service: 01/10/2022 9:15 AM Medical Record Number: [id number] Patient Account Number: [id number] Date of Birth/Sex: April 11, 1961 (61 y.o. M) Treating RN: [name] Primary Care Provider: [name] Other Clinician: Referring Provider: [name] Treating Provider/Extender: Skipper Cliche in Treatment: 5 Active Problems ICD-10 Encounter Code Description Active Date MDM Diagnosis 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 (idiopathic) with ulcer and inflammation of 12/06/2021 No Yes left lower extremity L97.322 Non-pressure chronic ulcer of left ankle with fat layer exposed 12/06/2021 No Yes I69.354 Hemiplegia and hemiparesis following cerebral infarction affecting left 12/06/2021 No Yes non-dominant side Inactive Problems Resolved Problems Electronic Signature(s) Signed: 01/10/2022 9:32:01 AM By: Worthy Keeler PA-C Entered By: Worthy Keeler on 01/10/2022 09:32:00 Chad [name] ([id number]) -------------------------------------------------------------------------------- Progress Note Details Patient Name: Chad [name] Date of Service: 01/10/2022  9:15 AM Medical Record Number: [id number] Patient Account Number: [id number] Date of Birth/Sex: October 25, 1960 (61 y.o. M) Treating RN: [name] Primary Care Provider: [name] Other Clinician: Referring Provider: [name] Treating Provider/Extender: Skipper Cliche in Treatment: 5 Subjective Chief Complaint Information obtained from Patient Left ankle ulcers History of Present Illness (HPI) 10/08/18 on evaluation today patient actually presents to our office for initial evaluation concerning wounds that he has of the bilateral lower extremities. 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 He also has bilateral venous stasis. He apparently has been homeless currently following discharge from the hospital apparently he has been placed at [facility] which is is a skilled nursing facility locally. Nonetheless fortunately he does not show any signs of infection at this time which is good news. In fact several of the wound actually appears to be showing some signs of improvement already in my pinion. There are a couple areas in the left leg in particular there likely gonna require some sharp debridement to help clear away some necrotic tissue and help with more sufficient healing. No fevers, chills, nausea, or vomiting noted at this time. 10/15/18 on evaluation today patient actually appears to be doing very well in regard to his bilateral lower extremities. He's been tolerating the dressing changes without complication. Fortunately there does not appear to be any evidence of active infection at this time which is great news. Overall I'm actually very pleased with how this has progressed in just one visits time. Readmission: 08/14/2020 upon evaluation today patient presents for re-evaluation here in our clinic. He is having issues with his left ankle region as well as his right toe and his right heel. He tells me that the toe and heel actually began as a area that was itching that he was scratching and then subsequently opened up into wounds. These may have been abscess areas I presume based on what I am seeing currently. With regard to his left ankle region he tells me this was a similar type occurrence although he does have venous stasis this very well may be more of a venous leg ulcer more than anything. Nonetheless I do believe that the patient would benefit from appropriate and aggressive wound care to try to help get things under better control here. He does have history of a stroke on the left  side affecting him to some degree there that he is able to stand although he does have some residual weakness. Otherwise again the patient does have chronic venous insufficiency as previously noted. His arterial studies most recently obtained showed that he had an ABI on the right of 1.16 with a TBI of 0.52 and on the left and ABI of 1.14 with a TBI of 0.81. That was obtained on 06/19/2020. 08/28/2020 upon evaluation today patient appears to be doing decently well in regard to his wounds in general. He has been tolerating the dressing changes without complication. Fortunately there does not appear to be any signs of active infection which is great news. With that being said I think the [facility] is doing a good job I would recommend that we likely continue with that currently. 09/11/2020 upon evaluation today patient's wounds did not appear to be doing too poorly but again he is not really showing signs of significant improvement with regard to any of the wounds on  the right. None of them have Hydrofera Blue on them I am not exactly sure why this is not being followed as the facility did not contact us to let us know of any issues with obtaining dressings or otherwise. With that being said he is supposed to be using Hydrofera Blue on both of the wounds on the right foot as well as the ankle wound on the left side. 09/18/2020 upon evaluation today patient appears to be doing poorly with regard to his wounds. Again right now the left ankle in particular showed signs of extreme maceration. Apparently he was told by someone with staff at [facility] they could not get the [facility]. With that being said this is something that is never been relayed to [place] one way or another. Also the patient subsequently has not supposed to have a border gauze dressing on. He should have an ABD pad and roll gauze to secure as this drains much too much just to have a border gauze dressing to cover. Nonetheless  the fact that they are not using the appropriate dressing is directly causing deterioration of the left ankle wound it is significantly worse today compared to what it was previous. I did attempt to call [facility] while the patient was here I called three times and got no one to even pick up the phone. After this I had my for an office coordinator call and she was able to finally get through and leave a message with the D ON as of dictation of this note which is roughly about an hour and a half later I still have not been able to speak with anyone at the facility. 09/25/2020 upon evaluation today patient actually showing signs of good improvement which is excellent news. He has been tolerating the dressing changes without complication. Fortunately there is no signs of active infection which is great news. No fevers, chills, nausea, vomiting, or diarrhea. I do feel like the facility has been doing a much better job at taking care of him as far as the dressings are concerned. However the director of nursing never did call me back. 10/09/2020 upon evaluation today patient appears to be doing well with regard to his wound. The toe ulcer did require some debridement but the other 2 areas actually appear to be doing quite well. 10/19/2020 upon evaluation today patient actually appears to be doing very well in regard to his wounds. In fact the heel does appear to be completely healed. The toe is doing better in the medial ankle on the left is also doing better. Overall I think he is headed in the right direction. 10/26/2020 upon evaluation today patient appears to be doing well with regard to his wound. He is showing signs of improvement which is great news and overall I am very pleased with where things stand today. No fevers, chills, nausea, vomiting, or diarrhea. 11/02/2020 upon evaluation today patient appears to be doing well with regard to his wounds. He has been tolerating the dressing changes  without complication overall I am extremely pleased with where things stand today. He in regard to the toe is almost completely healed and the medial [facility] ([id number]) ankle on the left is doing much better. 11/09/2020 upon evaluation today patient appears to be doing a little poorly in regard to his left medial ankle ulcer. Fortunately there does not appear to be any signs of systemic infection but unfortunately locally he does appear to be infected in fact he has  blue-green drainage consistent with Pseudomonas. 11/16/2020 upon evaluation today patient appears to be doing well with regard to his wound. It actually appears to be doing better. I did place him on gentamicin cream since the Cipro was actually resistant even though he was positive for Pseudomonas on culture. Overall I think that he does seem to be doing better though I am unsure whether or not they have actually been putting the cream on. The patient is not sure that we did talk to the nurse directly and she was going to initiate that treatment. Fortunately there does not appear to be any signs of active infection at this time. No fevers, chills, nausea, vomiting, or diarrhea. 4/28; the area on the right second toe is close to healed. Left medial ankle required debridement 12/07/2020 upon evaluation today patient appears to be doing well with regard to his wounds. In fact the right second toe appears to be completely healed which is great news. Fortunately there does not appear to be any signs of active infection at this time which is also great news. I think we can probably discontinue the gentamicin on top of everything else. 12/14/2020 upon evaluation today patient appears to be doing well with regard to his wound. He is making good progress and overall very pleased with where things stand today. There is no signs of active infection at this time which is great news. 12/28/2020 upon evaluation today patient appears to be doing  well with regard to his wounds. He has been tolerating the dressing changes without complication. Fortunately there is no signs of active infection at this time. No fevers, chills, nausea, vomiting, or diarrhea. 12/28/2020 upon evaluation today patient's wound bed actually showed signs of excellent improvement. He has great epithelization and granulation I do not see any signs of infection overall I am extremely pleased with where things stand at this point. No fevers, chills, nausea, vomiting, or diarrhea. 01/11/2021 upon evaluation today patient appears to be doing well with regard to his wound on his leg. He has been tolerating the dressing changes without complication. Fortunately there does not appear to be any signs of active infection which is great news. No fevers, chills, nausea, vomiting, or diarrhea. 01/25/2021 upon evaluation today patient appears to be doing well with regard to his wound. He has been tolerating the dressing changes without complication. Fortunately the collagen seems to be doing a great job which is excellent news. No fevers, chills, nausea, vomiting, or diarrhea. 02/08/2021 upon evaluation today patient's wound is actually looking a little bit worse especially in the periwound compared to previous. Fortunately there does not appear to be any signs of infection which is great news with that being said he does have some irritation around the periphery of the wound which has me more concerned. He actually had a dressing on that had not been changed in 3 days. He also is supposed to have daily dressing changes. With regard to the dressing applied he had a silver alginate dressing and silver collagen is what is recommended and ordered. He also had no Desitin around the edges of the wound in the periwound region although that is on the order inspect to be done as well. In general I was very concerned I did contact [facility] actually spoke with Magda Paganini who is the scheduling  individual and subsequently she stated that she would pass the information to the D ON apparently the D ON was not available to talk to me when I call  today. 02/18/2021 upon evaluation today patient's wound is actually showing signs of improvement. Fortunately there does not appear to be any evidence of infection which is great news overall I am extremely pleased with where things stand today. No fevers, chills, nausea, vomiting, or diarrhea. 8/3; patient presents for 1 week follow-up. He has no issues or complaints today. He denies signs of infection. 03/11/2021 upon evaluation today patient appears to be doing well with regard to his wound. He does have a little bit of slough noted on the surface of the wound but fortunately there does not appear to be any signs of active infection at this time. No fevers, chills, nausea, vomiting, or diarrhea. 03/18/2021 upon evaluation today patient appears to be doing well with regard to his wound. He has been tolerating the dressing changes without complication. There was a little irritation more proximal to where the wound was that was not noted last week but nonetheless this is very superficial just seems to be more irritation we just need to make sure to put a good amount of the zinc over the area in my opinion. Otherwise he does not seem to be doing significantly worse at all which is great news. 03/25/2021 upon evaluation today patient appears to be doing well with regard to his wound. He is going require some sharp debridement today to clear with some of the necrotic debris. I did perform this today without complication postdebridement wound bed appears to be doing much better this is great news. 04/08/2021 upon evaluation today patient appears to be doing decently well in regard to his wound although the overall measurement is not significantly smaller compared to previous. It is gone down a little bit but still the facility continues to not really put the  appropriate dressings in place in fact he was supposed to have collagen we think he probably had more of an allergy to At this point. Fortunately there does not appear to be any signs of active infection systemically though locally I do not see anything on initial visualization either as far as erythema or warmth. 04/15/2021 upon evaluation today patient appears to be doing well with regard to his wound. He is actually showing signs of improvement. I did place him on antibiotics last week, Cipro. He has been taking that 2 times a day and seems to be tolerating it very well. I do not see any evidence of worsening and in fact the overall appearance of the wound is smaller today which is also great news. 9/26; left medial ankle chronic venous insufficiency wound is improved. Using Hydrofera Blue 10/10; left medial ankle chronic venous insufficiency. Wound has not changed much in appearance completely nonviable surface. Apparently there have been problems getting the right product on the wound at the facility although he came in with [facility] on today 05/14/2021 upon evaluation today patient appears to be doing well with regard to his wound. I think he is making progress here which is good news. Fortunately there does not appear to be any signs of active infection at this time. No fevers, chills, nausea, vomiting, or diarrhea. 05/20/2021 upon evaluation today patient appears to be doing well with regard to his wound. He is showing signs of good improvement which is great news. There does not appear to be any evidence of active infection which is also excellent news. No fevers, chills, nausea, vomiting, or diarrhea. NAYTHEN, HEIKKILA ([id number]) 05/28/2021 upon evaluation today patient appears to be doing quite well. There does not appear  to be any signs of active infection at this time which is great news. Overall I am extremely pleased with where things stand today. I think he is headed in the right  direction. 06/11/2021 upon evaluation today patient appears to be doing well with regard to his left ankle ulcer and poorly in regard to the toe ulcer on the second toe right foot. This appears to show signs of joint exposure. Apparently this has been present for 1 to 2 months although he kept forgetting to tell me about it. That is unfortunate as right now it definitely appears to be doing significantly worse than what I would like to see. There does not appear to be any signs of active infection systemically though locally I am concerned about the possibility of infection the toe is quite red. Again no one from the facility ever contacted [place] to advise that this was going on in the interim either. 06/17/2021 upon evaluation today patient presents for follow-up I did review his x-ray which showed a navicular bone fracture I am unsure of the chronicity of this. Subsequently he also had osteomyelitis of the toe which was what I was more concerned about this did not show up on x-ray but did show up on the pathology scrapings. This was listed as acute osteomyelitis. Nonetheless at this point I think that the antibiotic treatment is the best regimen to go with currently. The patient is in agreement with that plan. Nonetheless he has initially 30 days of doxycycline off likely extend that towards the end of the treatment cycle that will be around the middle of December for an additional 2 weeks. That all depends on how well he continues to heal. Nonetheless based on what I am seeing in the foot I did want a proceed with an MRI as well which I think will be helpful to identify if there is anything else that needs to be addressed from the standpoint of infection. 06/24/2021 upon evaluation today patient appears to be doing pretty well in regards to his wounds. I think both are actually showing signs of improvement which is good I did review his MRI today which did show signs of osteomyelitis of the middle and  proximal phalanx on his right foot of the affected toe. With that being said this is actually showing signs of significant improvement today already with the antibiotic therapy I think the redness is also improved. Overall I think that we just need to give this some time with appropriate wound care we will see how things go potentially hyperbarics could be considered. 07/02/2021 upon inspection today patient actually appears to be doing well in regard to his left ankle which is getting very close to complete resolution of pleased in that regard. Unfortunately he is continuing to have issues with his second toe right foot and this seems to still be very painful for him. Recommend he try something different from the standpoint of antibiotics. 07/15/2021 upon evaluation today patient appears to be doing actually pretty well in regard to his foot. This is actually showing signs of significant improvement which is great news. Overall I feel like the patient is improving both in regard to the second toe as well as the ankle on the left. With that being said the biggest issue that I do see currently is that he is needing to have a refill of the doxycycline that we previously treated him with. He also did see podiatry they are not going to recommend any amputation  at this point since he seems to be doing quite well. For that reason we just need to keep things under control from an infection standpoint. 08/01/2021 upon evaluation today patient appears to be doing well with regard to his wound. He has been tolerating the dressing changes without complication. Fortunately there does not appear to be any evidence of active infection locally nor systemically at this point. In fact I think everything is doing excellent in fact his second toe on the right foot is almost healed and the ankle on the left ankle region is actually very close to being healed as well. 08/08/2021 upon evaluation today patient appears to be  doing well with regard to his wound. He has been tolerating the dressing changes without complication. Fortunately I do not see any signs of active infection at this time. Readmission: 12-06-2021 upon evaluation today patient presents for reevaluation here in the clinic he does tell me that he was being seen in facility at [facility] by a provider that was coming in. He is not sure who this was. He tells me however that the wound seems to have gotten worse even compared to where it was when we last saw him at this point. With that being said I do believe that he is likely going need ongoing wound care here in the clinic and I do believe that we need to be the ones to frontline this since his wound does seem to be getting worse not better at this point. He voiced understanding. He is also in agreement with this plan and feels more comfortable coming here she tells me. Patient's medical history really has not changed since his prior admission he was only gone since January. 12-27-2021 upon evaluation today patient appears to be doing well with regard to his wound they did run out of the [facility] so they did not put anything on just an ABD pad with gentamicin. Still we are seeing some signs of good improvement here with some new epithelization which is great news. 01-10-2022 upon evaluation today patient appears to be doing well with regard to his wounds and he is going require some sharp debridement but overall seems to be making good progress. Fortunately I do not see any evidence of active infection locally or systemically at this time which is great news. Objective Constitutional Well-nourished and well-hydrated in no acute distress. Vitals Time Taken: 9:30 AM, Height: 67 in, Weight: 150 lbs, BMI: 23.5, Temperature: 98.3 F, Pulse: 87 bpm, Respiratory Rate: 18 breaths/min, Blood Pressure: 108/72 mmHg. Respiratory normal breathing without difficulty. DEBBIE, BELLUCCI  ([id number]) Psychiatric this patient is able to make decisions and demonstrates good insight into disease process. Alert and Oriented x 3. pleasant and cooperative. General Notes: Upon inspection patient's wound bed showed signs of good granulation and epithelization at this point. Fortunately I do not see any evidence of anything worsening significantly which is great news and overall I am extremely pleased with where we stand today. I did perform debridement of clearway slough and necrotic debris down to good subcutaneous tissue patient tolerated that today without complication. Integumentary (Hair, Skin) Wound #11 status is Open. Original cause of wound was Gradually Appeared. The date acquired was: 11/06/2021. The wound has been in treatment 5 weeks. The wound is located on the Left,Proximal,Medial Ankle. The wound measures 1.3cm length x 1.2cm width x 0.1cm depth; 1.225cm^2 area and 0.123cm^3 volume. There is Fat Layer (Subcutaneous Tissue) exposed. There is no tunneling or undermining noted. There is  a medium amount of serosanguineous drainage noted. There is medium (34-66%) pink granulation within the wound bed. There is a medium (34-66%) amount of necrotic tissue within the wound bed including Adherent Slough. Wound #12 status is Open. Original cause of wound was Gradually Appeared. The date acquired was: 07/12/2019. The wound has been in treatment 5 weeks. The wound is located on the Left,Distal,Medial Ankle. The wound measures 3.7cm length x 2cm width x 0.1cm depth; 5.812cm^2 area and 0.581cm^3 volume. There is Fat Layer (Subcutaneous Tissue) exposed. There is no tunneling or undermining noted. There is a medium amount of serosanguineous drainage noted. There is medium (34-66%) pink granulation within the wound bed. There is a medium (34-66%) amount of necrotic tissue within the wound bed including Adherent Slough. 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 There was a Excisional Skin/Subcutaneous Tissue Debridement with a total area of 1.56 sq cm performed by Tommie Sams., PA-C. With the following instrument(s): Curette to remove Viable and Non-Viable tissue/material. Material removed includes Subcutaneous Tissue and Slough and. No specimens were taken. A time out was conducted at 09:43, prior to the start of the procedure. A Minimum amount of bleeding was controlled with Pressure. The procedure was tolerated well. Post Debridement Measurements: 1.3cm length x 1.2cm width x 0.1cm depth; 0.123cm^3 volume. Character of Wound/Ulcer Post Debridement is stable. Severity of Tissue Post Debridement is: Fat layer exposed. Post procedure Diagnosis Wound #11: Same as Pre-Procedure Wound #12 Pre-procedure diagnosis of Wound #12 is a Venous Leg Ulcer located on the Left,Distal,Medial Ankle .Severity of Tissue Pre Debridement is: Fat layer exposed. There was a Excisional Skin/Subcutaneous Tissue Debridement with a total area of 7.4 sq cm performed by Tommie Sams., PA-C. With the following instrument(s): Curette to remove Viable and Non-Viable tissue/material. Material removed includes Subcutaneous Tissue and Slough and. No specimens were taken. A time out was conducted at 09:45, prior to the start of the procedure. A Moderate amount of bleeding was controlled with Pressure. The procedure was tolerated well. Post Debridement Measurements: 3.7cm length x 2cm width x 0.1cm depth; 0.581cm^3 volume. Character of Wound/Ulcer Post Debridement is stable. Severity of Tissue  Post Debridement is: Fat layer exposed. Post procedure Diagnosis Wound #12: Same as Pre-Procedure Plan Follow-up Appointments: Return Appointment in 2 weeks. Bathing/ Shower/ Hygiene: Wash wounds with antibacterial soap and water. No tub bath. WOUND #11: - Ankle Wound Laterality: Left, Medial, Proximal [place], [place] ([id number]) Cleanser: Soap and Water 3 x Per Week/30 Days Discharge Instructions: Gently cleanse wound with antibacterial soap, rinse and pat dry prior to dressing wounds Cleanser: Wound Cleanser 3 x Per Week/30 Days Discharge Instructions: Wash your hands with soap and water. Remove old dressing, discard into plastic bag and place into trash. Cleanse the wound with Wound Cleanser prior to applying a clean dressing using gauze sponges, not tissues or cotton balls. Do not scrub or use excessive force. Pat dry using gauze sponges, not tissue or cotton balls. Topical: Gentamicin 3 x Per Week/30 Days Discharge Instructions: Apply as directed by provider. Primary Dressing: Hydrofera Blue Ready Transfer Foam, 2.5x2.5 (in/in) 3 x Per Week/30 Days Discharge Instructions: Apply Hydrofera Blue Ready to wound bed  as directed Secondary Dressing: ABD Pad 5x9 (in/in) 3 x Per Week/30 Days Discharge Instructions: Cover with ABD pad Secured With: Medipore Tape - 55M Medipore H Soft Cloth Surgical Tape, 2x2 (in/yd) 3 x Per Week/30 Days Secured With: [employer] Sterile or Non-Sterile 6-ply 4.5x4 (yd/yd) 3 x Per Week/30 Days Discharge Instructions: Apply Kerlix as directed WOUND #12: - Ankle Wound Laterality: Left, Medial, Distal Cleanser: Soap and Water 3 x Per Week/30 Days Discharge Instructions: Gently cleanse wound with antibacterial soap, rinse and pat dry prior to dressing wounds Cleanser: Wound Cleanser 3 x Per Week/30 Days Discharge Instructions: Wash your hands with soap and water. Remove old dressing, discard into plastic bag and place into trash. Cleanse the wound with Wound  Cleanser prior to applying a clean dressing using gauze sponges, not tissues or cotton balls. Do not scrub or use excessive force. Pat dry using gauze sponges, not tissue or cotton balls. Topical: Gentamicin 3 x Per Week/30 Days Discharge Instructions: Apply as directed by provider. Primary Dressing: Hydrofera Blue Ready Transfer Foam, 2.5x2.5 (in/in) 3 x Per Week/30 Days Discharge Instructions: Apply Hydrofera Blue Ready to wound bed as directed Secondary Dressing: ABD Pad 5x9 (in/in) 3 x Per Week/30 Days Discharge Instructions: Cover with ABD pad Secured With: Medipore Tape - 55M Medipore H Soft Cloth Surgical Tape, 2x2 (in/yd) 3 x Per Week/30 Days Secured With: [employer] Sterile or Non-Sterile 6-ply 4.5x4 (yd/yd) 3 x Per Week/30 Days Discharge Instructions: Apply Kerlix as directed 1. I would recommend currently that we going to continue with the wound care measures as before and the patient is in agreement with plan. This includes the use of the [facility] which I do feel like is doing a good job. 2. We will also continue with the gentamicin ointment underneath. 3. I would also recommend continued roll gauze usage as opposed to anything sticky on his skin he never really does great with that. We will see patient back for reevaluation in 1 week here in the clinic. If anything worsens or changes patient will contact our office for additional recommendations. Electronic Signature(s) Signed: 01/10/2022 9:50:25 AM By: Worthy Keeler PA-C Entered By: Worthy Keeler on 01/10/2022 09:50:25 Chad [name] ([id number]) -------------------------------------------------------------------------------- SuperBill Details Patient Name: Chad [name] Date of Service: 01/10/2022 Medical Record Number: [id number] Patient Account Number: [id number] Date of Birth/Sex: 1961-03-25 (61 y.o. M) Treating RN: [name] Primary Care Provider: [name] Other Clinician: Referring Provider:  [name] Treating Provider/Extender: Skipper Cliche in Treatment: 5 Diagnosis Coding ICD-10 Codes Code Description [phone] Chronic venous hypertension (idiopathic) with ulcer and inflammation of left lower extremity L97.322 Non-pressure chronic ulcer of left ankle with fat layer exposed 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 side Facility Procedures CPT4 Code: [id number] Description: 44967 - DEB SUBQ TISSUE 20 SQ CM/< Modifier: Quantity: 1 CPT4 Code: Description: ICD-10 Diagnosis Description R91.638 Non-pressure chronic ulcer of left ankle with fat layer exposed Modifier: Quantity: Physician Procedures CPT4 Code: [id number] Description: 11042 - WC PHYS SUBQ TISS 20 SQ CM Modifier: Quantity: 1 CPT4 Code: Description: ICD-10 Diagnosis Description T70.177 Non-pressure chronic ulcer of left ankle with fat layer exposed Modifier: Quantity: Electronic Signature(s) Signed: 01/10/2022 9:50:35 AM By: Worthy Keeler PA-C Entered By: Worthy Keeler on 01/10/2022 09:50:35

## 2022-01-24 ENCOUNTER — Encounter: Payer: Medicaid Other | Admitting: Physician Assistant

## 2022-01-24 ENCOUNTER — Ambulatory Visit
Admission: RE | Admit: 2022-01-24 | Discharge: 2022-01-24 | Disposition: A | Discharge status: Home / Self Care | Payer: Medicaid Other | Source: Ambulatory Visit | Attending: Internal Medicine | Admitting: Internal Medicine

## 2022-01-24 DIAGNOSIS — C187 Malignant neoplasm of sigmoid colon: Secondary | ICD-10-CM | POA: Diagnosis present

## 2022-01-24 DIAGNOSIS — C661 Malignant neoplasm of right ureter: Secondary | ICD-10-CM | POA: Diagnosis present

## 2022-01-24 DIAGNOSIS — I87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 hypertension (idiopathic) with ulcer and inflammation of left lower extremity: Secondary | ICD-10-CM | POA: Diagnosis not present

## 2022-01-24 MED ORDER — IOHEXOL 300 MG/ML  SOLN
100.0000 mL | Freq: Once | INTRAMUSCULAR | Status: AC | PRN
  Administered 2022-01-24: 100 mL via INTRAVENOUS

## 2022-01-24 NOTE — Progress Notes (Signed)
Chad [name], Chad [name] ([id number]) Visit Report for 01/24/2022 Arrival Information Details Patient Name: Chad [name], Chad [name] Date of Service: 01/24/2022 10:45 AM Medical Record Number: [id number] Patient Account Number: [id number] Date of Birth/Sex: 04-Mar-1961 (61 y.o. M) Treating RN: [name] Primary Care Orlene Salmons: [name] Other Clinician: Referring Valoree Agent: [name] Treating Bailey Faiella/Extender: Skipper Cliche in Treatment: 7 Visit Information History Since Last Visit Added or deleted any medications: No Patient Arrived: Wheel Chair Any new allergies or adverse reactions: No Arrival Time: 10:54 Had a fall or experienced change in No Accompanied By: self activities of daily living that may affect Transfer Assistance: EasyPivot Patient Lift risk of falls: Patient Identification Verified: Yes Hospitalized since last visit: No Secondary Verification Process Completed: Yes Has Dressing in Place as Prescribed: Yes Patient Requires Transmission-Based No Pain Present Now: No Precautions: Patient Has Alerts: Yes Patient Alerts: Patient on Blood Thinner NOT diabetic aspirin [facility]m [facility] Electronic Signature(s) Signed: 01/24/2022 3:07:26 PM By: GLevora DredgeEntered By: GLevora Dredgeon 01/24/2022 10:54:26 Chad [name]([id number] -------------------------------------------------------------------------------- Clinic Level of Care Assessment Details Patient Name: Chad SabalDate of Service: 01/24/2022 10:45 AM Medical Record Number: [id number] Account Number: [id number] of Birth/Sex: 3Oct 27, 1962(61 y.o. M) Treating RN: GLevora DredgePrimary Care Zyquan Crotty: LErik ObeyOther Clinician: Referring Ahava Kissoon: LErik ObeyTreating Manasseh Pittsley/Extender: SSkipper Clichein Treatment: 7 Clinic Level of Care Assessment Items TOOL 1 Quantity Score '[]'  - Use when EandM and Procedure is performed on INITIAL visit 0 ASSESSMENTS - Nursing  Assessment / Reassessment '[]'  - General Physical Exam (combine w/ comprehensive assessment (listed just below) when performed on new 0 pt. evals) '[]'  - 0 Comprehensive Assessment (HX, ROS, Risk Assessments, Wounds Hx, etc.) ASSESSMENTS - Wound and Skin Assessment / Reassessment '[]'  - Dermatologic / Skin Assessment (not related to wound area) 0 ASSESSMENTS - Ostomy and/or Continence Assessment and Care '[]'  - Incontinence Assessment and Management 0 '[]'  - 0 Ostomy Care Assessment and Management (repouching, etc.) PROCESS - Coordination of Care '[]'  - Simple Patient / Family Education for ongoing care 0 '[]'  - 0 Complex (extensive) Patient / Family Education for ongoing care '[]'  - 0 Staff obtains CProgrammer, systems Records, Test Results / Process Orders '[]'  - 0 Staff telephones HHA, Nursing Homes / Clarify orders / etc '[]'  - 0 Routine Transfer to another Facility (non-emergent condition) '[]'  - 0 Routine Hospital Admission (non-emergent condition) '[]'  - 0 New Admissions / IBiomedical engineer/ Ordering NPWT, Apligraf, etc. '[]'  - 0 Emergency Hospital Admission (emergent condition) PROCESS - Special Needs '[]'  - Pediatric / Minor Patient Management 0 '[]'  - 0 Isolation Patient Management '[]'  - 0 Hearing / Language / Visual special needs '[]'  - 0 Assessment of Community assistance (transportation, D/C planning, etc.) '[]'  - 0 Additional assistance / Altered mentation '[]'  - 0 Support Surface(s) Assessment (bed, cushion, seat, etc.) INTERVENTIONS - Miscellaneous '[]'  - External ear exam 0 '[]'  - 0 Patient Transfer (multiple staff / HCivil Service fast streamer/ Similar devices) '[]'  - 0 Simple Staple / Suture removal (25 or less) '[]'  - 0 Complex Staple / Suture removal (26 or more) '[]'  - 0 Hypo/Hyperglycemic Management (do not check if billed separately) '[]'  - 0 Ankle / Brachial Index (ABI) - do not check if billed separately Has the patient been seen at the hospital within the last three years: Yes Total Score: 0 Level Of  Care: ____ Chad [name]([id number] Electronic Signature(s) Signed: 01/24/2022 3:07:26 PM By: GLevora DredgeEntered By: GLevora Dredgeon 01/24/2022 11:45:44 Chad [name], Chad [name] (  [id number]) -------------------------------------------------------------------------------- Encounter Discharge Information Details Patient Name: Chad [name], Chad [name] Date of Service: 01/24/2022 10:45 AM Medical Record Number: [id number] Patient Account Number: [id number] Date of Birth/Sex: May 30, 1961 (61 y.o. M) Treating RN: [name] Primary Care Tonyia Marschall: [name] Other Clinician: Referring Ashleigh Luckow: [name] Treating Collan Schoenfeld/Extender: Skipper Cliche in Treatment: 7 Encounter Discharge Information Items Post Procedure Vitals Discharge Condition: Stable Temperature (F): 98 Ambulatory Status: Wheelchair Pulse (bpm): 85 Discharge Destination: [place] Respiratory Rate (breaths/min): 18 Telephoned: No Blood Pressure (mmHg): 119/87 Orders Sent: Yes Transportation: Other Accompanied By: self Schedule Follow-up Appointment: Yes Clinical Summary of Care: Electronic Signature(s) Signed: 01/24/2022 11:47:06 AM By: [name] Entered By: [name] on 01/24/2022 11:47:06 Chad [name] ([id number]) -------------------------------------------------------------------------------- Lower Extremity Assessment Details Patient Name: Chad [name] Date of Service: 01/24/2022 10:45 AM Medical Record Number: [id number] Patient Account Number: [id number] Date of Birth/Sex: [birth year] (61 y.o. M) Treating RN: [name] Primary Care Ajeenah Heiny: [name] Other Clinician: Referring Gertrude Bucks: [name] Treating Batool Majid/Extender: Jeri Cos Weeks in Treatment: 7 Edema Assessment Assessed: [Left: No] [Right: No] Edema: [Left: Ye] [Right: s] Calf Left: Right: Point of Measurement: 31 cm From Medial Instep 34 cm Ankle Left: Right: Point of Measurement: 11 cm  From Medial Instep 22.3 cm Vascular Assessment Pulses: Dorsalis Pedis Palpable: [Left:Yes] Electronic Signature(s) Signed: 01/24/2022 3:07:26 PM By: [name] Entered By: [name] on 01/24/2022 11:03:43 Chad [name] ([id number]) -------------------------------------------------------------------------------- Multi Wound Chart Details Patient Name: Chad [name] Date of Service: 01/24/2022 10:45 AM Medical Record Number: [id number] Patient Account Number: [id number] Date of Birth/Sex: 02/27/1961 (61 y.o. M) Treating RN: [name] Primary Care Ramy Greth: [name] Other Clinician: Referring Luwana Butrick: [name] Treating Gerell Fortson/Extender: Skipper Cliche in Treatment: 7 Vital Signs Height(in): 83 Pulse(bpm): 70 Weight(lbs): 150 Blood Pressure(mmHg): 119/87 Body Mass Index(BMI): 23.5 Temperature(F): 98 Respiratory Rate(breaths/min): 18 Photos: [N/A:N/A] Wound Location: Left, Proximal, Medial Ankle Left, Distal, Medial Ankle N/A Wounding Event: Gradually Appeared Gradually Appeared N/A Primary Etiology: Venous Leg Ulcer Venous Leg Ulcer N/A Comorbid History: Anemia, Chronic Obstructive Anemia, Chronic Obstructive N/A Pulmonary Disease (COPD), Pulmonary Disease (COPD), Coronary Artery Disease, Peripheral Coronary Artery Disease, Peripheral Arterial Disease, Peripheral Venous Arterial Disease, Peripheral Venous Disease, Hepatitis C, Osteoarthritis, Disease, Hepatitis C, Osteoarthritis, Neuropathy, Received Neuropathy, Received Chemotherapy Chemotherapy Date Acquired: 11/06/2021 07/12/2019 N/A Weeks of Treatment: 7 7 N/A Wound Status: Open Open N/A Wound Recurrence: No No N/A Measurements L x W x D (cm) 0.8x0.5x0.1 3.4x2x0.2 N/A Area (cm) : 0.314 5.341 N/A Volume (cm) : 0.031 1.068 N/A % Reduction in Area: 92.10% 22.70% N/A % Reduction in Volume: 92.20% 22.70% N/A Classification: Full Thickness Without Exposed Full Thickness Without Exposed  N/A Support Structures Support Structures Exudate Amount: Medium Medium N/A Exudate Type: Serosanguineous Serosanguineous N/A Exudate Color: red, brown red, brown N/A Granulation Amount: Medium (34-66%) Medium (34-66%) N/A Granulation Quality: Pink, Pale Pink N/A Necrotic Amount: Medium (34-66%) Medium (34-66%) N/A Exposed Structures: Fat Layer (Subcutaneous Tissue): Fat Layer (Subcutaneous Tissue): N/A Yes Yes Fascia: No Fascia: No Tendon: No Tendon: No Muscle: No Muscle: No Joint: No Joint: No Bone: No Bone: No Epithelialization: Small (1-33%) Small (1-33%) N/A Treatment Notes Electronic Signature(s) Signed: 01/24/2022 3:07:26 PM By: Morrison Old, [name] ([id number]) Entered By: [name] on 01/24/2022 11:13:28 Chad [name] ([id number]) -------------------------------------------------------------------------------- Multi-Disciplinary Care Plan Details Patient Name: Chad [name] Date of Service: 01/24/2022 10:45 AM Medical Record Number: [id number] Patient Account Number: [id number] Date of Birth/Sex: 03-04-1961 (61 y.o. M) Treating RN: [name] Primary Care Jalea Bronaugh: [name] Other  Clinician: Referring Rasheen Schewe: [name] Treating Niva Murren/Extender: Skipper Cliche in Treatment: 7 Active Inactive Venous Leg Ulcer Nursing Diagnoses: Knowledge deficit related to disease process and management Goals: Patient will maintain optimal edema control Date Initiated: 12/06/2021 Date Inactivated: 12/27/2021 Target Resolution Date: 01/03/2022 Goal Status: Met Patient/caregiver will verbalize understanding of disease process and disease management Date Initiated: 12/06/2021 Target Resolution Date: 01/03/2022 Goal Status: Active Interventions: Assess peripheral edema status every visit. Compression as ordered Notes: Electronic Signature(s) Signed: 01/24/2022 3:07:26 PM By: [name] Entered By: [name] on 01/24/2022  11:13:20 Chad [name], Chad [name] ([id number]) -------------------------------------------------------------------------------- Pain Assessment Details Patient Name: Chad [name] Date of Service: 01/24/2022 10:45 AM Medical Record Number: [id number] Patient Account Number: [id number] Date of Birth/Sex: 11-08-1960 (61 y.o. M) Treating RN: [name] Primary Care Shade Kaley: [name] Other Clinician: Referring Candia Kingsbury: [name] Treating Frederika Hukill/Extender: Skipper Cliche in Treatment: 7 Active Problems Location of Pain Severity and Description of Pain Patient Has Paino No Site Locations Rate the pain. Current Pain Level: 0 Pain Management and Medication Current Pain Management: Electronic Signature(s) Signed: 01/24/2022 3:07:26 PM By: [name] Entered By: [name] on 01/24/2022 10:55:36 Chad [name] ([id number]) -------------------------------------------------------------------------------- Patient/Caregiver Education Details Patient Name: Chad [name] Date of Service: 01/24/2022 10:45 AM Medical Record Number: [id number] Patient Account Number: [id number] Date of Birth/Gender: [birth year] (61 y.o. M) Treating RN: [name] Primary Care Physician: [name] Other Clinician: Referring Physician: [name] Treating Physician/Extender: Skipper Cliche in Treatment: 7 Education Assessment Education Provided To: Patient Education Topics Provided Wound Debridement: Handouts: Wound Debridement Methods: Explain/Verbal Responses: State content correctly Wound/Skin Impairment: Handouts: Caring for Your Ulcer Methods: Explain/Verbal Responses: State content correctly Electronic Signature(s) Signed: 01/24/2022 3:07:26 PM By: [name] Entered By: [name] on 01/24/2022 11:46:13 Chad [name] ([id number]) -------------------------------------------------------------------------------- Wound Assessment Details Patient Name:  Chad [name] Date of Service: 01/24/2022 10:45 AM Medical Record Number: [id number] Patient Account Number: [id number] Date of Birth/Sex: [birth year] (61 y.o. M) Treating RN: [name] Primary Care Brit Carbonell: [name] Other Clinician: Referring Tyjai Matuszak: [name] Treating Jyssica Rief/Extender: Jeri Cos Weeks in Treatment: 7 Wound Status Wound Number: 11 Primary Venous Leg Ulcer Etiology: Wound Location: Left, Proximal, Medial Ankle Wound Open Wounding Event: Gradually Appeared Status: Date Acquired: 11/06/2021 Comorbid Anemia, Chronic Obstructive Pulmonary Disease (COPD), Weeks Of Treatment: 7 History: Coronary Artery Disease, Peripheral Arterial Disease, Clustered Wound: No Peripheral Venous Disease, Hepatitis C, Osteoarthritis, Neuropathy, Received Chemotherapy Photos Wound Measurements Length: (cm) 0.8 Width: (cm) 0.5 Depth: (cm) 0.1 Area: (cm) 0.314 Volume: (cm) 0.031 % Reduction in Area: 92.1% % Reduction in Volume: 92.2% Epithelialization: Small (1-33%) Tunneling: No Undermining: No Wound Description Classification: Full Thickness Without Exposed Support Structu Exudate Amount: Medium Exudate Type: Serosanguineous Exudate Color: red, brown res Foul Odor After Cleansing: No Slough/Fibrino Yes Wound Bed Granulation Amount: Medium (34-66%) Exposed Structure Granulation Quality: Pink, Pale Fascia Exposed: No Necrotic Amount: Medium (34-66%) Fat Layer (Subcutaneous Tissue) Exposed: Yes Necrotic Quality: Adherent Slough Tendon Exposed: No Muscle Exposed: No Joint Exposed: No Bone Exposed: No Treatment Notes Wound #11 (Ankle) Wound Laterality: Left, Medial, Proximal Cleanser Soap and Water Discharge Instruction: Gently cleanse wound with antibacterial soap, rinse and pat dry prior to dressing wounds Wound Cleanser Chad [name], Chad [name] ([id number]) Discharge Instruction: Wash your hands with soap and water. Remove old dressing, discard into plastic  bag and place into trash. Cleanse the wound with Wound Cleanser prior to applying a clean dressing using gauze sponges, not tissues or cotton balls. Do not scrub or use excessive force. Fraser Din  dry using gauze sponges, not tissue or cotton balls. Peri-Wound Care Topical Gentamicin Discharge Instruction: Apply as directed by Chad [name]. Primary Dressing Hydrofera Blue Ready Transfer Foam, 2.5x2.5 (in/in) Discharge Instruction: Apply Hydrofera Blue Ready to wound bed as directed. If using hydrofera blue that is hard, this needs to be moistened with saline prior to application Secondary Dressing ABD Pad 5x9 (in/in) Discharge Instruction: Cover with ABD pad Secured With Medipore Tape - 56M Medipore H Soft Cloth Surgical Tape, 2x2 (in/yd) Kerlix Roll Sterile or Non-Sterile 6-ply 4.5x4 (yd/yd) Discharge Instruction: Apply Kerlix as directed Tubigrip Size C, 2.75x10 (in/yd) Discharge Instruction: Apply 3 Tubigrip C 3-finger-widths below knee to base of toes to secure dressing and/or for swelling. This can be hand washed and air dried Compression Wrap Compression Stockings Add-Ons Electronic Signature(s) Signed: 01/24/2022 3:07:26 PM By: [name] Entered By: [name] on 01/24/2022 11:02:46 Chad [name] ([id number]) -------------------------------------------------------------------------------- Wound Assessment Details Patient Name: Chad [name] Date of Service: 01/24/2022 10:45 AM Medical Record Number: [id number] Patient Account Number: [id number] Date of Birth/Sex: 24-Oct-1960 (61 y.o. M) Treating RN: [name] Primary Care Lindley Stachnik: [name] Other Clinician: Referring Liann Spaeth: [name] Treating Rondo Spittler/Extender: Jeri Cos Weeks in Treatment: 7 Wound Status Wound Number: 12 Primary Venous Leg Ulcer Etiology: Wound Location: Left, Distal, Medial Ankle Wound Open Wounding Event: Gradually Appeared Status: Date Acquired: 07/12/2019 Comorbid Anemia,  Chronic Obstructive Pulmonary Disease (COPD), Weeks Of Treatment: 7 History: Coronary Artery Disease, Peripheral Arterial Disease, Clustered Wound: No Peripheral Venous Disease, Hepatitis C, Osteoarthritis, Neuropathy, Received Chemotherapy Photos Wound Measurements Length: (cm) 3.4 Width: (cm) 2 Depth: (cm) 0.2 Area: (cm) 5.341 Volume: (cm) 1.068 % Reduction in Area: 22.7% % Reduction in Volume: 22.7% Epithelialization: Small (1-33%) Tunneling: No Undermining: No Wound Description Classification: Full Thickness Without Exposed Support Structu Exudate Amount: Medium Exudate Type: Serosanguineous Exudate Color: red, brown res Foul Odor After Cleansing: No Slough/Fibrino Yes Wound Bed Granulation Amount: Medium (34-66%) Exposed Structure Granulation Quality: Pink Fascia Exposed: No Necrotic Amount: Medium (34-66%) Fat Layer (Subcutaneous Tissue) Exposed: Yes Necrotic Quality: Adherent Slough Tendon Exposed: No Muscle Exposed: No Joint Exposed: No Bone Exposed: No Treatment Notes Wound #12 (Ankle) Wound Laterality: Left, Medial, Distal Cleanser Soap and Water Discharge Instruction: Gently cleanse wound with antibacterial soap, rinse and pat dry prior to dressing wounds Wound Cleanser Chad [name], Chad [name] ([id number]) Discharge Instruction: Wash your hands with soap and water. Remove old dressing, discard into plastic bag and place into trash. Cleanse the wound with Wound Cleanser prior to applying a clean dressing using gauze sponges, not tissues or cotton balls. Do not scrub or use excessive force. Pat dry using gauze sponges, not tissue or cotton balls. Peri-Wound Care Topical Gentamicin Discharge Instruction: Apply as directed by Tyauna Lacaze. Primary Dressing Hydrofera Blue Ready Transfer Foam, 2.5x2.5 (in/in) Discharge Instruction: Apply Hydrofera Blue Ready to wound bed as directed Secondary Dressing ABD Pad 5x9 (in/in) Discharge Instruction: Cover with ABD  pad Secured With Medipore Tape - 56M Medipore H Soft Cloth Surgical Tape, 2x2 (in/yd) Kerlix Roll Sterile or Non-Sterile 6-ply 4.5x4 (yd/yd) Discharge Instruction: Apply Kerlix as directed Tubigrip Size C, 2.75x10 (in/yd) Discharge Instruction: Apply 3 Tubigrip C 3-finger-widths below knee to base of toes to secure dressing and/or for swelling. This can be hand washed and air dried Compression Wrap Compression Stockings Add-Ons Electronic Signature(s) Signed: 01/24/2022 3:07:26 PM By: [name] Entered By: [name] on 01/24/2022 11:03:23 Chad [name] ([id number]) -------------------------------------------------------------------------------- Vitals Details Patient Name: Chad [name] Date of Service: 01/24/2022 10:45 AM Medical Record Number:  [id number] Patient Account Number: [id number] Date of Birth/Sex: 09-08-1960 (61 y.o. M) Treating RN: [name] Primary Care Thailyn Khalid: [name] Other Clinician: Referring Lanika Colgate: [name] Treating Keairra Bardon/Extender: Skipper Cliche in Treatment: 7 Vital Signs Time Taken: 10:54 Temperature (F): 98 Height (in): 67 Pulse (bpm): 85 Weight (lbs): 150 Respiratory Rate (breaths/min): 18 Body Mass Index (BMI): 23.5 Blood Pressure (mmHg): 119/87 Reference Range: 80 - 120 mg / dl Electronic Signature(s) Signed: 01/24/2022 3:07:26 PM By: [name] Entered By: [name] on 01/24/2022 10:55:04

## 2022-01-24 NOTE — Progress Notes (Addendum)
MITHRAN, STRIKE ([id number]) Visit Report for 01/24/2022 Chief Complaint Document Details Patient Name: Chad [name], Chad [name] Date of Service: 01/24/2022 10:45 AM Medical Record Number: [id number] Patient Account Number: [id number] Date of Birth/Sex: [birth year] (61 y.o. M) Treating RN: [name] Primary Care Provider: [name] Other Clinician: Referring Provider: [name] Treating Provider/Extender: Skipper Cliche in Treatment: 7 Information Obtained from: Patient Chief Complaint Left ankle ulcers Electronic Signature(s) Signed: 01/24/2022 11:10:06 AM By: Worthy Keeler PA-C Entered By: Worthy Keeler on 01/24/2022 11:10:06 Chad [name] ([id number]) -------------------------------------------------------------------------------- Debridement Details Patient Name: Chad [name] Date of Service: 01/24/2022 10:45 AM Medical Record Number: [id number] Patient Account Number: [id number] Date of Birth/Sex: 09/13/1960 (61 y.o. M) Treating RN: [name] Primary Care Provider: [name] Other Clinician: Referring Provider: [name] Treating Provider/Extender: Skipper Cliche in Treatment: 7 Debridement Performed for Wound #11 Left,Proximal,Medial Ankle Assessment: Performed By: Physician Tommie Sams., PA-C Debridement Type: Debridement Severity of Tissue Pre Debridement: Fat layer exposed Level of Consciousness (Pre- Awake and Alert procedure): Pre-procedure Verification/Time Out Yes - 11:14 Taken: Pain Control: Lidocaine 4% Topical Solution Total Area Debrided (L x W): 0.8 (cm) x 0.5 (cm) = 0.4 (cm) Tissue and other material Viable, Non-Viable, Slough, Subcutaneous, Slough debrided: Level: Skin/Subcutaneous Tissue Debridement Description: Excisional Instrument: Curette Bleeding: Minimum Hemostasis Achieved: Pressure Response to Treatment: Procedure was tolerated well Level of Consciousness (Post- Awake and Alert procedure): Post  Debridement Measurements of Total Wound Length: (cm) 0.8 Width: (cm) 0.5 Depth: (cm) 0.1 Volume: (cm) 0.031 Character of Wound/Ulcer Post Debridement: Stable Severity of Tissue Post Debridement: Fat layer exposed Post Procedure Diagnosis Same as Pre-procedure Electronic Signature(s) Signed: 01/24/2022 3:07:26 PM By: [name] Signed: 01/24/2022 4:59:17 PM By: Worthy Keeler PA-C Entered By: [name] on 01/24/2022 11:15:11 Chad [name] ([id number]) -------------------------------------------------------------------------------- Debridement Details Patient Name: Chad [name] Date of Service: 01/24/2022 10:45 AM Medical Record Number: [id number] Patient Account Number: [id number] Date of Birth/Sex: 06/05/1961 (61 y.o. M) Treating RN: [name] Primary Care Provider: [name] Other Clinician: Referring Provider: [name] Treating Provider/Extender: Skipper Cliche in Treatment: 7 Debridement Performed for Wound #12 Left,Distal,Medial Ankle Assessment: Performed By: Physician Tommie Sams., PA-C Debridement Type: Debridement Severity of Tissue Pre Debridement: Fat layer exposed Level of Consciousness (Pre- Awake and Alert procedure): Pre-procedure Verification/Time Out Yes - 11:14 Taken: Pain Control: Lidocaine 4% Topical Solution Total Area Debrided (L x W): 3.4 (cm) x 2 (cm) = 6.8 (cm) Tissue and other material Viable, Non-Viable, Slough, Subcutaneous, Slough debrided: Level: Skin/Subcutaneous Tissue Debridement Description: Excisional Instrument: Curette Bleeding: Minimum Hemostasis Achieved: Pressure Response to Treatment: Procedure was tolerated well Level of Consciousness (Post- Awake and Alert procedure): Post Debridement Measurements of Total Wound Length: (cm) 3.4 Width: (cm) 2 Depth: (cm) 0.2 Volume: (cm) 1.068 Character of Wound/Ulcer Post Debridement: Stable Severity of Tissue Post Debridement: Fat layer  exposed Post Procedure Diagnosis Same as Pre-procedure Electronic Signature(s) Signed: 01/24/2022 3:07:26 PM By: [name] Signed: 01/24/2022 4:59:17 PM By: Worthy Keeler PA-C Entered By: [name] on 01/24/2022 11:15:52 Chad [name] ([id number]) -------------------------------------------------------------------------------- HPI Details Patient Name: Chad [name] Date of Service: 01/24/2022 10:45 AM Medical Record Number: [id number] Patient Account Number: [id number] Date of Birth/Sex: 12/07/1960 (61 y.o. M) Treating RN: [name] Primary Care Provider: [name] Other Clinician: Referring Provider: [name] Treating Provider/Extender: Skipper Cliche in Treatment: 7 History of Present Illness HPI Description: 10/08/18 on evaluation today patient actually presents to our office for initial evaluation concerning wounds that he has of  the bilateral lower extremities. 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 He also has bilateral venous stasis. He apparently has been homeless currently following discharge from the hospital apparently he has been placed at [facility] which is is a skilled nursing facility locally. Nonetheless fortunately he does not show any signs of infection at this time which is good news. In fact several of the wound actually appears to be showing some signs of improvement already in my pinion. There are a couple areas in the left leg in particular there likely gonna require some sharp debridement to help clear away some necrotic tissue and help with more sufficient healing. No fevers, chills, nausea, or vomiting noted at this time. 10/15/18 on evaluation today patient actually appears to be doing very well in regard to his bilateral lower extremities. He's been tolerating  the dressing changes without complication. Fortunately there does not appear to be any evidence of active infection at this time which is great news. Overall I'm actually very pleased with how this has progressed in just one visits time. Readmission: 08/14/2020 upon evaluation today patient presents for re-evaluation here in our clinic. He is having issues with his left ankle region as well as his right toe and his right heel. He tells me that the toe and heel actually began as a area that was itching that he was scratching and then subsequently opened up into wounds. These may have been abscess areas I presume based on what I am seeing currently. With regard to his left ankle region he tells me this was a similar type occurrence although he does have venous stasis this very well may be more of a venous leg ulcer more than anything. Nonetheless I do believe that the patient would benefit from appropriate and aggressive wound care to try to help get things under better control here. He does have history of a stroke on the left side affecting him to some degree there that he is able to stand although he does have some residual weakness. Otherwise again the patient does have chronic venous insufficiency as previously noted. His arterial studies most recently obtained showed that he had an ABI on the right of 1.16 with a TBI of 0.52 and on the left and ABI of 1.14 with a TBI of 0.81. That was obtained on 06/19/2020. 08/28/2020 upon evaluation today patient appears to be doing decently well in regard to his wounds in general. He has been tolerating the dressing changes without complication. Fortunately there does not appear to be any signs of active infection which is great news. With that being said I think the [facility] is doing a good job I would recommend that we likely continue with that currently. 09/11/2020 upon evaluation today patient's wounds did not appear to be doing too poorly but again he  is not really showing signs of significant improvement with regard to any of the wounds on the right. None of them have Hydrofera Blue on them I am not exactly sure why this is not being followed as the facility did not contact us to let us know of any issues with obtaining dressings or otherwise. With that being said he is supposed to be using Hydrofera Blue on both of the wounds on the right foot as well as the ankle wound on the left side. 09/18/2020 upon evaluation  today patient appears to be doing poorly with regard to his wounds. Again right now the left ankle in particular showed signs of extreme maceration. Apparently he was told by someone with staff at [place] they could not get the [facility]. With that being said this is something that is never been relayed to [place] one way or another. Also the patient subsequently has not supposed to have a border gauze dressing on. He should have an ABD pad and roll gauze to secure as this drains much too much just to have a border gauze dressing to cover. Nonetheless the fact that they are not using the appropriate dressing is directly causing deterioration of the left ankle wound it is significantly worse today compared to what it was previous. I did attempt to call [facility] while the patient was here I called three times and got no one to even pick up the phone. After this I had my for an office coordinator call and she was able to finally get through and leave a message with the D ON as of dictation of this note which is roughly about an hour and a half later I still have not been able to speak with anyone at the facility. 09/25/2020 upon evaluation today patient actually showing signs of good improvement which is excellent news. He has been tolerating the dressing changes without complication. Fortunately there is no signs of active infection which is great news. No fevers, chills, nausea, vomiting, or diarrhea. I do feel like  the facility has been doing a much better job at taking care of him as far as the dressings are concerned. However the director of nursing never did call me back. 10/09/2020 upon evaluation today patient appears to be doing well with regard to his wound. The toe ulcer did require some debridement but the other 2 areas actually appear to be doing quite well. 10/19/2020 upon evaluation today patient actually appears to be doing very well in regard to his wounds. In fact the heel does appear to be completely healed. The toe is doing better in the medial ankle on the left is also doing better. Overall I think he is headed in the right direction. 10/26/2020 upon evaluation today patient appears to be doing well with regard to his wound. He is showing signs of improvement which is great news and overall I am very pleased with where things stand today. No fevers, chills, nausea, vomiting, or diarrhea. 11/02/2020 upon evaluation today patient appears to be doing well with regard to his wounds. He has been tolerating the dressing changes without complication overall I am extremely pleased with where things stand today. He in regard to the toe is almost completely healed and the medial ankle on the left is doing much better. 11/09/2020 upon evaluation today patient appears to be doing a little poorly in regard to his left medial ankle ulcer. Fortunately there does not appear to be any signs of systemic infection but unfortunately locally he does appear to be infected in fact he has blue-green drainage consistent with Pseudomonas. Chad [name], Chad [name] ([id number]) 11/16/2020 upon evaluation today patient appears to be doing well with regard to his wound. It actually appears to be doing better. I did place him on gentamicin cream since the Cipro was actually resistant even though he was positive for Pseudomonas on culture. Overall I think that he does seem to be doing better though I am unsure whether or not they have  actually been putting the cream  on. The patient is not sure that we did talk to the nurse directly and she was going to initiate that treatment. Fortunately there does not appear to be any signs of active infection at this time. No fevers, chills, nausea, vomiting, or diarrhea. 4/28; the area on the right second toe is close to healed. Left medial ankle required debridement 12/07/2020 upon evaluation today patient appears to be doing well with regard to his wounds. In fact the right second toe appears to be completely healed which is great news. Fortunately there does not appear to be any signs of active infection at this time which is also great news. I think we can probably discontinue the gentamicin on top of everything else. 12/14/2020 upon evaluation today patient appears to be doing well with regard to his wound. He is making good progress and overall very pleased with where things stand today. There is no signs of active infection at this time which is great news. 12/28/2020 upon evaluation today patient appears to be doing well with regard to his wounds. He has been tolerating the dressing changes without complication. Fortunately there is no signs of active infection at this time. No fevers, chills, nausea, vomiting, or diarrhea. 12/28/2020 upon evaluation today patient's wound bed actually showed signs of excellent improvement. He has great epithelization and granulation I do not see any signs of infection overall I am extremely pleased with where things stand at this point. No fevers, chills, nausea, vomiting, or diarrhea. 01/11/2021 upon evaluation today patient appears to be doing well with regard to his wound on his leg. He has been tolerating the dressing changes without complication. Fortunately there does not appear to be any signs of active infection which is great news. No fevers, chills, nausea, vomiting, or diarrhea. 01/25/2021 upon evaluation today patient appears to be doing well with  regard to his wound. He has been tolerating the dressing changes without complication. Fortunately the collagen seems to be doing a great job which is excellent news. No fevers, chills, nausea, vomiting, or diarrhea. 02/08/2021 upon evaluation today patient's wound is actually looking a little bit worse especially in the periwound compared to previous. Fortunately there does not appear to be any signs of infection which is great news with that being said he does have some irritation around the periphery of the wound which has me more concerned. He actually had a dressing on that had not been changed in 3 days. He also is supposed to have daily dressing changes. With regard to the dressing applied he had a silver alginate dressing and silver collagen is what is recommended and ordered. He also had no Desitin around the edges of the wound in the periwound region although that is on the order inspect to be done as well. In general I was very concerned I did contact [facility] actually spoke with Magda Paganini who is the scheduling individual and subsequently she stated that she would pass the information to the D ON apparently the D ON was not available to talk to me when I call today. 02/18/2021 upon evaluation today patient's wound is actually showing signs of improvement. Fortunately there does not appear to be any evidence of infection which is great news overall I am extremely pleased with where things stand today. No fevers, chills, nausea, vomiting, or diarrhea. 8/3; patient presents for 1 week follow-up. He has no issues or complaints today. He denies signs of infection. 03/11/2021 upon evaluation today patient appears to be doing well with  regard to his wound. He does have a little bit of slough noted on the surface of the wound but fortunately there does not appear to be any signs of active infection at this time. No fevers, chills, nausea, vomiting, or diarrhea. 03/18/2021 upon evaluation today  patient appears to be doing well with regard to his wound. He has been tolerating the dressing changes without complication. There was a little irritation more proximal to where the wound was that was not noted last week but nonetheless this is very superficial just seems to be more irritation we just need to make sure to put a good amount of the zinc over the area in my opinion. Otherwise he does not seem to be doing significantly worse at all which is great news. 03/25/2021 upon evaluation today patient appears to be doing well with regard to his wound. He is going require some sharp debridement today to clear with some of the necrotic debris. I did perform this today without complication postdebridement wound bed appears to be doing much better this is great news. 04/08/2021 upon evaluation today patient appears to be doing decently well in regard to his wound although the overall measurement is not significantly smaller compared to previous. It is gone down a little bit but still the facility continues to not really put the appropriate dressings in place in fact he was supposed to have collagen we think he probably had more of an allergy to At this point. Fortunately there does not appear to be any signs of active infection systemically though locally I do not see anything on initial visualization either as far as erythema or warmth. 04/15/2021 upon evaluation today patient appears to be doing well with regard to his wound. He is actually showing signs of improvement. I did place him on antibiotics last week, Cipro. He has been taking that 2 times a day and seems to be tolerating it very well. I do not see any evidence of worsening and in fact the overall appearance of the wound is smaller today which is also great news. 9/26; left medial ankle chronic venous insufficiency wound is improved. Using Hydrofera Blue 10/10; left medial ankle chronic venous insufficiency. Wound has not changed much in  appearance completely nonviable surface. Apparently there have been problems getting the right product on the wound at the facility although he came in with [facility] on today 05/14/2021 upon evaluation today patient appears to be doing well with regard to his wound. I think he is making progress here which is good news. Fortunately there does not appear to be any signs of active infection at this time. No fevers, chills, nausea, vomiting, or diarrhea. 05/20/2021 upon evaluation today patient appears to be doing well with regard to his wound. He is showing signs of good improvement which is great news. There does not appear to be any evidence of active infection which is also excellent news. No fevers, chills, nausea, vomiting, or diarrhea. 05/28/2021 upon evaluation today patient appears to be doing quite well. There does not appear to be any signs of active infection at this time which is great news. Overall I am extremely pleased with where things stand today. I think he is headed in the right direction. 06/11/2021 upon evaluation today patient appears to be doing well with regard to his left ankle ulcer and poorly in regard to the toe ulcer on the second toe right foot. This appears to show signs of joint exposure. Apparently this has been present  for 1 to 2 months although he kept [facility] ([id number]) forgetting to tell me about it. That is unfortunate as right now it definitely appears to be doing significantly worse than what I would like to see. There does not appear to be any signs of active infection systemically though locally I am concerned about the possibility of infection the toe is quite red. Again no one from the facility ever contacted [place] to advise that this was going on in the interim either. 06/17/2021 upon evaluation today patient presents for follow-up I did review his x-ray which showed a navicular bone fracture I am unsure of the chronicity of this. Subsequently he  also had osteomyelitis of the toe which was what I was more concerned about this did not show up on x-ray but did show up on the pathology scrapings. This was listed as acute osteomyelitis. Nonetheless at this point I think that the antibiotic treatment is the best regimen to go with currently. The patient is in agreement with that plan. Nonetheless he has initially 30 days of doxycycline off likely extend that towards the end of the treatment cycle that will be around the middle of December for an additional 2 weeks. That all depends on how well he continues to heal. Nonetheless based on what I am seeing in the foot I did want a proceed with an MRI as well which I think will be helpful to identify if there is anything else that needs to be addressed from the standpoint of infection. 06/24/2021 upon evaluation today patient appears to be doing pretty well in regards to his wounds. I think both are actually showing signs of improvement which is good I did review his MRI today which did show signs of osteomyelitis of the middle and proximal phalanx on his right foot of the affected toe. With that being said this is actually showing signs of significant improvement today already with the antibiotic therapy I think the redness is also improved. Overall I think that we just need to give this some time with appropriate wound care we will see how things go potentially hyperbarics could be considered. 07/02/2021 upon inspection today patient actually appears to be doing well in regard to his left ankle which is getting very close to complete resolution of pleased in that regard. Unfortunately he is continuing to have issues with his second toe right foot and this seems to still be very painful for him. Recommend he try something different from the standpoint of antibiotics. 07/15/2021 upon evaluation today patient appears to be doing actually pretty well in regard to his foot. This is actually showing signs  of significant improvement which is great news. Overall I feel like the patient is improving both in regard to the second toe as well as the ankle on the left. With that being said the biggest issue that I do see currently is that he is needing to have a refill of the doxycycline that we previously treated him with. He also did see podiatry they are not going to recommend any amputation at this point since he seems to be doing quite well. For that reason we just need to keep things under control from an infection standpoint. 08/01/2021 upon evaluation today patient appears to be doing well with regard to his wound. He has been tolerating the dressing changes without complication. Fortunately there does not appear to be any evidence of active infection locally nor systemically at this point. In fact I think everything is  doing excellent in fact his second toe on the right foot is almost healed and the ankle on the left ankle region is actually very close to being healed as well. 08/08/2021 upon evaluation today patient appears to be doing well with regard to his wound. He has been tolerating the dressing changes without complication. Fortunately I do not see any signs of active infection at this time. Readmission: 12-06-2021 upon evaluation today patient presents for reevaluation here in the clinic he does tell me that he was being seen in facility at [facility] by a provider that was coming in. He is not sure who this was. He tells me however that the wound seems to have gotten worse even compared to where it was when we last saw him at this point. With that being said I do believe that he is likely going need ongoing wound care here in the clinic and I do believe that we need to be the ones to frontline this since his wound does seem to be getting worse not better at this point. He voiced understanding. He is also in agreement with this plan and feels more comfortable coming here she tells  me. Patient's medical history really has not changed since his prior admission he was only gone since January. 12-27-2021 upon evaluation today patient appears to be doing well with regard to his wound they did run out of the [facility] so they did not put anything on just an ABD pad with gentamicin. Still we are seeing some signs of good improvement here with some new epithelization which is great news. 01-10-2022 upon evaluation today patient appears to be doing well with regard to his wounds and he is going require some sharp debridement but overall seems to be making good progress. Fortunately I do not see any evidence of active infection locally or systemically at this time which is great news. 01-24-2022 upon evaluation today patient appears to be doing well with regard to his wound. The facility actually came and dropped him off early and he had another appointment at the hospital and then they just brought him over here and this was still hours before his appointment this afternoon. For that reason we did do our best to work him in this morning and fortunately had some space to make this happen. With that being said patient's wound does seem to be making progress here and I am very pleased in that regard I do not see any signs of active infection locally or systemically at this time. Electronic Signature(s) Signed: 01/24/2022 2:58:17 PM By: Worthy Keeler PA-C Entered By: Worthy Keeler on 01/24/2022 14:58:17 Chad [name], Chad [name] ([id number]) -------------------------------------------------------------------------------- Physical Exam Details Patient Name: Chad [name] Date of Service: 01/24/2022 10:45 AM Medical Record Number: [id number] Patient Account Number: [id number] Date of Birth/Sex: 10-Dec-1960 (61 y.o. M) Treating RN: [name] Primary Care Provider: [name] Other Clinician: Referring Provider: [name] Treating Provider/Extender: Skipper Cliche in  Treatment: 7 Constitutional Well-nourished and well-hydrated in no acute distress. Respiratory normal breathing without difficulty. Psychiatric this patient is able to make decisions and demonstrates good insight into disease process. Alert and Oriented x 3. pleasant and cooperative. Notes Upon inspection patient's wound bed actually showed signs of good granulation and epithelization at this point. Fortunately I do not see any signs of active infection at this point either which is also great news. No fevers, chills, nausea, vomiting, or diarrhea. Electronic Signature(s) Signed: 01/24/2022 2:58:32 PM By: Worthy Keeler PA-C  Entered By: Worthy Keeler on 01/24/2022 14:58:32 Chad [name] ([id number]) -------------------------------------------------------------------------------- Physician Orders Details Patient Name: Chad [name] Date of Service: 01/24/2022 10:45 AM Medical Record Number: [id number] Patient Account Number: [id number] Date of Birth/Sex: 26-May-1961 (61 y.o. 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 Bathing/ Shower/ Hygiene o Wash wounds with antibacterial soap and water. o No tub bath. Wound Treatment Wound #11 - Ankle Wound Laterality: Left, Medial, Proximal Cleanser: Soap and Water 3 x Per Week/30 Days Discharge Instructions: Gently cleanse wound with antibacterial soap, rinse and pat dry prior to dressing  wounds Cleanser: Wound Cleanser 3 x Per Week/30 Days Discharge Instructions: Wash your hands with soap and water. Remove old dressing, discard into plastic bag and place into trash. Cleanse the wound with Wound Cleanser prior to applying a clean dressing using gauze sponges, not tissues or cotton balls. Do not scrub or use excessive force. Pat dry using gauze sponges, not tissue or cotton balls. Topical: Gentamicin 3 x Per Week/30 Days Discharge Instructions: Apply as directed by provider. Primary Dressing: Hydrofera Blue Ready Transfer Foam, 2.5x2.5 (in/in) 3 x Per Week/30 Days Discharge Instructions: Apply Hydrofera Blue Ready to wound bed as directed. If using hydrofera blue that is hard, this needs to be moistened with saline prior to application Secondary Dressing: ABD Pad 5x9 (in/in) 3 x Per Week/30 Days Discharge Instructions: Cover with ABD pad Secured With: [employer] Tape, 2x2 (in/yd) 3 x Per Week/30 Days Secured With: [employer] Sterile or Non-Sterile 6-ply 4.5x4 (yd/yd) 3 x Per Week/30 Days Discharge Instructions: Apply Kerlix as directed Secured With: Tubigrip Size C, 2.75x10 (in/yd) 3 x Per Week/30 Days Discharge Instructions: Apply 3 Tubigrip C 3-finger-widths below knee to base of toes to secure dressing and/or for swelling. This can be hand washed and air dried Wound #12 - Ankle Wound Laterality: Left, Medial, Distal Cleanser: Soap and Water 3 x Per Week/30 Days Discharge Instructions: Gently cleanse wound with antibacterial soap, rinse and pat dry prior to dressing wounds Cleanser: Wound Cleanser 3 x Per Week/30 Days Discharge Instructions: Wash your hands with soap and water. Remove old dressing, discard into plastic bag and place into trash. Cleanse the wound with Wound Cleanser prior to applying a clean dressing using gauze sponges, not tissues or cotton balls. Do not scrub or use excessive force. Pat dry using gauze sponges, not  tissue or cotton balls. Topical: Gentamicin 3 x Per Week/30 Days Discharge Instructions: Apply as directed by provider. Chad [name], Chad [name] ([id number]) Primary Dressing: Hydrofera Blue Ready Transfer Foam, 2.5x2.5 (in/in) 3 x Per Week/30 Days Discharge Instructions: Apply Hydrofera Blue Ready to wound bed as directed Secondary Dressing: ABD Pad 5x9 (in/in) 3 x Per Week/30 Days Discharge Instructions: Cover with ABD pad Secured With: Medipore Tape - 81M Medipore H Soft Cloth Surgical Tape, 2x2 (in/yd) 3 x Per Week/30 Days Secured With: [employer] Sterile or Non-Sterile 6-ply 4.5x4 (yd/yd) 3 x Per Week/30 Days Discharge Instructions: Apply Kerlix as directed Secured With: Tubigrip Size C, 2.75x10 (in/yd) 3 x Per Week/30 Days Discharge Instructions: Apply  3 Tubigrip C 3-finger-widths below knee to base of toes to secure dressing and/or for swelling. This can be hand washed and air dried Electronic Signature(s) Signed: 01/24/2022 3:07:26 PM By: [name] Signed: 01/24/2022 4:59:17 PM By: Worthy Keeler PA-C Entered By: [name] on 01/24/2022 11:27:20 Chad [name], Chad [name] ([id number]) -------------------------------------------------------------------------------- Problem List Details Patient Name: Chad [name] Date of Service: 01/24/2022 10:45 AM Medical Record Number: [id number] Patient Account Number: [id number] Date of Birth/Sex: 1960-10-18 (61 y.o. M) Treating RN: [name] Primary Care Provider: [name] Other Clinician: Referring Provider: [name] Treating Provider/Extender: Skipper Cliche in Treatment: 7 Active Problems ICD-10 Encounter Code Description Active Date MDM Diagnosis 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 (idiopathic) with ulcer and inflammation of 12/06/2021 No Yes left lower extremity L97.322 Non-pressure chronic ulcer of left ankle with fat layer exposed 12/06/2021 No Yes I69.354 Hemiplegia and hemiparesis following cerebral infarction  affecting left 12/06/2021 No Yes non-dominant side Inactive Problems Resolved Problems Electronic Signature(s) Signed: 01/24/2022 11:10:03 AM By: Worthy Keeler PA-C Entered By: Worthy Keeler on 01/24/2022 11:10:03 Chad [name] ([id number]) -------------------------------------------------------------------------------- Progress Note Details Patient Name: Chad [name] Date of Service: 01/24/2022 10:45 AM Medical Record Number: [id number] Patient Account Number: [id number] Date of Birth/Sex: 1961-03-06 (61 y.o. M) Treating RN: [name] Primary Care Provider: [name] Other Clinician: Referring Provider: [name] Treating Provider/Extender: Skipper Cliche in Treatment: 7 Subjective Chief Complaint Information obtained from Patient Left ankle ulcers History of Present Illness (HPI) 10/08/18 on evaluation today patient actually presents to our office for initial evaluation concerning wounds that he has of the bilateral lower extremities. 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 He also has bilateral venous stasis. He apparently has been homeless currently following discharge from the hospital apparently he has been placed at [facility] which is is a skilled nursing facility locally. Nonetheless fortunately he does not show any signs of infection at this time which is good news. In fact several of the wound actually appears to be showing some signs of improvement already in my pinion. There are a couple areas in the left leg in particular there likely gonna require some sharp debridement to help clear away some necrotic tissue and help with more sufficient healing. No fevers, chills, nausea, or vomiting noted at this time. 10/15/18 on evaluation today patient actually appears to be doing very well in  regard to his bilateral lower extremities. He's been tolerating the dressing changes without complication. Fortunately there does not appear to be any evidence of active infection at this time which is great news. Overall I'm actually very pleased with how this has progressed in just one visits time. Readmission: 08/14/2020 upon evaluation today patient presents for re-evaluation here in our clinic. He is having issues with his left ankle region as well as his right toe and his right heel. He tells me that the toe and heel actually began as a area that was itching that he was scratching and then subsequently opened up into wounds. These may have been abscess areas I presume based on what I am seeing currently. With regard to his left ankle region he tells me this was a similar type occurrence although he does have venous stasis this very well may be more of a venous leg ulcer more than anything. Nonetheless I do believe that the patient would benefit from appropriate  and aggressive wound care to try to help get things under better control here. He does have history of a stroke on the left side affecting him to some degree there that he is able to stand although he does have some residual weakness. Otherwise again the patient does have chronic venous insufficiency as previously noted. His arterial studies most recently obtained showed that he had an ABI on the right of 1.16 with a TBI of 0.52 and on the left and ABI of 1.14 with a TBI of 0.81. That was obtained on 06/19/2020. 08/28/2020 upon evaluation today patient appears to be doing decently well in regard to his wounds in general. He has been tolerating the dressing changes without complication. Fortunately there does not appear to be any signs of active infection which is great news. With that being said I think the [facility] is doing a good job I would recommend that we likely continue with that currently. 09/11/2020 upon evaluation today  patient's wounds did not appear to be doing too poorly but again he is not really showing signs of significant improvement with regard to any of the wounds on the right. None of them have Hydrofera Blue on them I am not exactly sure why this is not being followed as the facility did not contact us to let us know of any issues with obtaining dressings or otherwise. With that being said he is supposed to be using Hydrofera Blue on both of the wounds on the right foot as well as the ankle wound on the left side. 09/18/2020 upon evaluation today patient appears to be doing poorly with regard to his wounds. Again right now the left ankle in particular showed signs of extreme maceration. Apparently he was told by someone with staff at [facility] they could not get the [facility]. With that being said this is something that is never been relayed to [place] one way or another. Also the patient subsequently has not supposed to have a border gauze dressing on. He should have an ABD pad and roll gauze to secure as this drains much too much just to have a border gauze dressing to cover. Nonetheless the fact that they are not using the appropriate dressing is directly causing deterioration of the left ankle wound it is significantly worse today compared to what it was previous. I did attempt to call [facility] while the patient was here I called three times and got no one to even pick up the phone. After this I had my for an office coordinator call and she was able to finally get through and leave a message with the D ON as of dictation of this note which is roughly about an hour and a half later I still have not been able to speak with anyone at the facility. 09/25/2020 upon evaluation today patient actually showing signs of good improvement which is excellent news. He has been tolerating the dressing changes without complication. Fortunately there is no signs of active infection which is great news.  No fevers, chills, nausea, vomiting, or diarrhea. I do feel like the facility has been doing a much better job at taking care of him as far as the dressings are concerned. However the director of nursing never did call me back. 10/09/2020 upon evaluation today patient appears to be doing well with regard to his wound. The toe ulcer did require some debridement but the other 2 areas actually appear to be doing quite well. 10/19/2020 upon evaluation  today patient actually appears to be doing very well in regard to his wounds. In fact the heel does appear to be completely healed. The toe is doing better in the medial ankle on the left is also doing better. Overall I think he is headed in the right direction. 10/26/2020 upon evaluation today patient appears to be doing well with regard to his wound. He is showing signs of improvement which is great news and overall I am very pleased with where things stand today. No fevers, chills, nausea, vomiting, or diarrhea. 11/02/2020 upon evaluation today patient appears to be doing well with regard to his wounds. He has been tolerating the dressing changes without complication overall I am extremely pleased with where things stand today. He in regard to the toe is almost completely healed and the medial [facility] ([id number]) ankle on the left is doing much better. 11/09/2020 upon evaluation today patient appears to be doing a little poorly in regard to his left medial ankle ulcer. Fortunately there does not appear to be any signs of systemic infection but unfortunately locally he does appear to be infected in fact he has blue-green drainage consistent with Pseudomonas. 11/16/2020 upon evaluation today patient appears to be doing well with regard to his wound. It actually appears to be doing better. I did place him on gentamicin cream since the Cipro was actually resistant even though he was positive for Pseudomonas on culture. Overall I think that he does seem to  be doing better though I am unsure whether or not they have actually been putting the cream on. The patient is not sure that we did talk to the nurse directly and she was going to initiate that treatment. Fortunately there does not appear to be any signs of active infection at this time. No fevers, chills, nausea, vomiting, or diarrhea. 4/28; the area on the right second toe is close to healed. Left medial ankle required debridement 12/07/2020 upon evaluation today patient appears to be doing well with regard to his wounds. In fact the right second toe appears to be completely healed which is great news. Fortunately there does not appear to be any signs of active infection at this time which is also great news. I think we can probably discontinue the gentamicin on top of everything else. 12/14/2020 upon evaluation today patient appears to be doing well with regard to his wound. He is making good progress and overall very pleased with where things stand today. There is no signs of active infection at this time which is great news. 12/28/2020 upon evaluation today patient appears to be doing well with regard to his wounds. He has been tolerating the dressing changes without complication. Fortunately there is no signs of active infection at this time. No fevers, chills, nausea, vomiting, or diarrhea. 12/28/2020 upon evaluation today patient's wound bed actually showed signs of excellent improvement. He has great epithelization and granulation I do not see any signs of infection overall I am extremely pleased with where things stand at this point. No fevers, chills, nausea, vomiting, or diarrhea. 01/11/2021 upon evaluation today patient appears to be doing well with regard to his wound on his leg. He has been tolerating the dressing changes without complication. Fortunately there does not appear to be any signs of active infection which is great news. No fevers, chills, nausea, vomiting, or diarrhea. 01/25/2021  upon evaluation today patient appears to be doing well with regard to his wound. He has been tolerating the dressing changes without  complication. Fortunately the collagen seems to be doing a great job which is excellent news. No fevers, chills, nausea, vomiting, or diarrhea. 02/08/2021 upon evaluation today patient's wound is actually looking a little bit worse especially in the periwound compared to previous. Fortunately there does not appear to be any signs of infection which is great news with that being said he does have some irritation around the periphery of the wound which has me more concerned. He actually had a dressing on that had not been changed in 3 days. He also is supposed to have daily dressing changes. With regard to the dressing applied he had a silver alginate dressing and silver collagen is what is recommended and ordered. He also had no Desitin around the edges of the wound in the periwound region although that is on the order inspect to be done as well. In general I was very concerned I did contact [facility] actually spoke with Magda Paganini who is the scheduling individual and subsequently she stated that she would pass the information to the D ON apparently the D ON was not available to talk to me when I call today. 02/18/2021 upon evaluation today patient's wound is actually showing signs of improvement. Fortunately there does not appear to be any evidence of infection which is great news overall I am extremely pleased with where things stand today. No fevers, chills, nausea, vomiting, or diarrhea. 8/3; patient presents for 1 week follow-up. He has no issues or complaints today. He denies signs of infection. 03/11/2021 upon evaluation today patient appears to be doing well with regard to his wound. He does have a little bit of slough noted on the surface of the wound but fortunately there does not appear to be any signs of active infection at this time. No fevers, chills,  nausea, vomiting, or diarrhea. 03/18/2021 upon evaluation today patient appears to be doing well with regard to his wound. He has been tolerating the dressing changes without complication. There was a little irritation more proximal to where the wound was that was not noted last week but nonetheless this is very superficial just seems to be more irritation we just need to make sure to put a good amount of the zinc over the area in my opinion. Otherwise he does not seem to be doing significantly worse at all which is great news. 03/25/2021 upon evaluation today patient appears to be doing well with regard to his wound. He is going require some sharp debridement today to clear with some of the necrotic debris. I did perform this today without complication postdebridement wound bed appears to be doing much better this is great news. 04/08/2021 upon evaluation today patient appears to be doing decently well in regard to his wound although the overall measurement is not significantly smaller compared to previous. It is gone down a little bit but still the facility continues to not really put the appropriate dressings in place in fact he was supposed to have collagen we think he probably had more of an allergy to At this point. Fortunately there does not appear to be any signs of active infection systemically though locally I do not see anything on initial visualization either as far as erythema or warmth. 04/15/2021 upon evaluation today patient appears to be doing well with regard to his wound. He is actually showing signs of improvement. I did place him on antibiotics last week, Cipro. He has been taking that 2 times a day and seems to be tolerating it  very well. I do not see any evidence of worsening and in fact the overall appearance of the wound is smaller today which is also great news. 9/26; left medial ankle chronic venous insufficiency wound is improved. Using Hydrofera Blue 10/10; left medial  ankle chronic venous insufficiency. Wound has not changed much in appearance completely nonviable surface. Apparently there have been problems getting the right product on the wound at the facility although he came in with [facility] on today 05/14/2021 upon evaluation today patient appears to be doing well with regard to his wound. I think he is making progress here which is good news. Fortunately there does not appear to be any signs of active infection at this time. No fevers, chills, nausea, vomiting, or diarrhea. 05/20/2021 upon evaluation today patient appears to be doing well with regard to his wound. He is showing signs of good improvement which is great news. There does not appear to be any evidence of active infection which is also excellent news. No fevers, chills, nausea, vomiting, or diarrhea. Chad [name], Chad [name] ([id number]) 05/28/2021 upon evaluation today patient appears to be doing quite well. There does not appear to be any signs of active infection at this time which is great news. Overall I am extremely pleased with where things stand today. I think he is headed in the right direction. 06/11/2021 upon evaluation today patient appears to be doing well with regard to his left ankle ulcer and poorly in regard to the toe ulcer on the second toe right foot. This appears to show signs of joint exposure. Apparently this has been present for 1 to 2 months although he kept forgetting to tell me about it. That is unfortunate as right now it definitely appears to be doing significantly worse than what I would like to see. There does not appear to be any signs of active infection systemically though locally I am concerned about the possibility of infection the toe is quite red. Again no one from the facility ever contacted [place] to advise that this was going on in the interim either. 06/17/2021 upon evaluation today patient presents for follow-up I did review his x-ray which showed a navicular  bone fracture I am unsure of the chronicity of this. Subsequently he also had osteomyelitis of the toe which was what I was more concerned about this did not show up on x-ray but did show up on the pathology scrapings. This was listed as acute osteomyelitis. Nonetheless at this point I think that the antibiotic treatment is the best regimen to go with currently. The patient is in agreement with that plan. Nonetheless he has initially 30 days of doxycycline off likely extend that towards the end of the treatment cycle that will be around the middle of December for an additional 2 weeks. That all depends on how well he continues to heal. Nonetheless based on what I am seeing in the foot I did want a proceed with an MRI as well which I think will be helpful to identify if there is anything else that needs to be addressed from the standpoint of infection. 06/24/2021 upon evaluation today patient appears to be doing pretty well in regards to his wounds. I think both are actually showing signs of improvement which is good I did review his MRI today which did show signs of osteomyelitis of the middle and proximal phalanx on his right foot of the affected toe. With that being said this is actually showing signs of significant improvement today already  with the antibiotic therapy I think the redness is also improved. Overall I think that we just need to give this some time with appropriate wound care we will see how things go potentially hyperbarics could be considered. 07/02/2021 upon inspection today patient actually appears to be doing well in regard to his left ankle which is getting very close to complete resolution of pleased in that regard. Unfortunately he is continuing to have issues with his second toe right foot and this seems to still be very painful for him. Recommend he try something different from the standpoint of antibiotics. 07/15/2021 upon evaluation today patient appears to be doing actually  pretty well in regard to his foot. This is actually showing signs of significant improvement which is great news. Overall I feel like the patient is improving both in regard to the second toe as well as the ankle on the left. With that being said the biggest issue that I do see currently is that he is needing to have a refill of the doxycycline that we previously treated him with. He also did see podiatry they are not going to recommend any amputation at this point since he seems to be doing quite well. For that reason we just need to keep things under control from an infection standpoint. 08/01/2021 upon evaluation today patient appears to be doing well with regard to his wound. He has been tolerating the dressing changes without complication. Fortunately there does not appear to be any evidence of active infection locally nor systemically at this point. In fact I think everything is doing excellent in fact his second toe on the right foot is almost healed and the ankle on the left ankle region is actually very close to being healed as well. 08/08/2021 upon evaluation today patient appears to be doing well with regard to his wound. He has been tolerating the dressing changes without complication. Fortunately I do not see any signs of active infection at this time. Readmission: 12-06-2021 upon evaluation today patient presents for reevaluation here in the clinic he does tell me that he was being seen in facility at [facility] by a provider that was coming in. He is not sure who this was. He tells me however that the wound seems to have gotten worse even compared to where it was when we last saw him at this point. With that being said I do believe that he is likely going need ongoing wound care here in the clinic and I do believe that we need to be the ones to frontline this since his wound does seem to be getting worse not better at this point. He voiced understanding. He is also in agreement  with this plan and feels more comfortable coming here she tells me. Patient's medical history really has not changed since his prior admission he was only gone since January. 12-27-2021 upon evaluation today patient appears to be doing well with regard to his wound they did run out of the [facility] so they did not put anything on just an ABD pad with gentamicin. Still we are seeing some signs of good improvement here with some new epithelization which is great news. 01-10-2022 upon evaluation today patient appears to be doing well with regard to his wounds and he is going require some sharp debridement but overall seems to be making good progress. Fortunately I do not see any evidence of active infection locally or systemically at this time which is great news. 01-24-2022 upon evaluation  today patient appears to be doing well with regard to his wound. The facility actually came and dropped him off early and he had another appointment at the hospital and then they just brought him over here and this was still hours before his appointment this afternoon. For that reason we did do our best to work him in this morning and fortunately had some space to make this happen. With that being said patient's wound does seem to be making progress here and I am very pleased in that regard I do not see any signs of active infection locally or systemically at this time. Objective Constitutional Well-nourished and well-hydrated in no acute distress. Chad [name], Chad [name] ([id number]) Vitals Time Taken: 10:54 AM, Height: 67 in, Weight: 150 lbs, BMI: 23.5, Temperature: 98 F, Pulse: 85 bpm, Respiratory Rate: 18 breaths/min, Blood Pressure: 119/87 mmHg. Respiratory normal breathing without difficulty. Psychiatric this patient is able to make decisions and demonstrates good insight into disease process. Alert and Oriented x 3. pleasant and cooperative. General Notes: Upon inspection patient's wound bed actually showed  signs of good granulation and epithelization at this point. Fortunately I do not see any signs of active infection at this point either which is also great news. No fevers, chills, nausea, vomiting, or diarrhea. Integumentary (Hair, Skin) Wound #11 status is Open. Original cause of wound was Gradually Appeared. The date acquired was: 11/06/2021. The wound has been in treatment 7 weeks. The wound is located on the Left,Proximal,Medial Ankle. The wound measures 0.8cm length x 0.5cm width x 0.1cm depth; 0.314cm^2 area and 0.031cm^3 volume. There is Fat Layer (Subcutaneous Tissue) exposed. There is no tunneling or undermining noted. There is a medium amount of serosanguineous drainage noted. There is medium (34-66%) pink, pale granulation within the wound bed. There is a medium (34-66%) amount of necrotic tissue within the wound bed including Adherent Slough. Wound #12 status is Open. Original cause of wound was Gradually Appeared. The date acquired was: 07/12/2019. The wound has been in treatment 7 weeks. The wound is located on the Left,Distal,Medial Ankle. The wound measures 3.4cm length x 2cm width x 0.2cm depth; 5.341cm^2 area and 1.068cm^3 volume. There is Fat Layer (Subcutaneous Tissue) exposed. There is no tunneling or undermining noted. There is a medium amount of serosanguineous drainage noted. There is medium (34-66%) pink granulation within the wound bed. There is a medium (34-66%) amount of necrotic tissue within the wound bed including Adherent Slough. 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 There was a Excisional Skin/Subcutaneous Tissue Debridement with a total area of 0.4 sq cm performed by Tommie Sams., PA-C. With the following instrument(s): Curette to remove Viable and Non-Viable tissue/material. Material removed includes Subcutaneous Tissue and Slough and after achieving pain control using Lidocaine 4% Topical Solution. No specimens were taken. A time out was conducted at 11:14, prior to the start of the procedure. A Minimum amount of bleeding was controlled with Pressure. The procedure was tolerated well. Post Debridement Measurements: 0.8cm length x 0.5cm width x 0.1cm depth; 0.031cm^3 volume. Character of Wound/Ulcer Post Debridement is stable. Severity of Tissue Post Debridement is: Fat layer exposed. Post procedure Diagnosis Wound #11: Same as Pre-Procedure  Wound #12 Pre-procedure diagnosis of Wound #12 is a Venous Leg Ulcer located on the Left,Distal,Medial Ankle .Severity of Tissue Pre Debridement is: Fat layer exposed. There was a Excisional Skin/Subcutaneous Tissue Debridement with a total area of 6.8 sq cm performed by Tommie Sams., PA-C. With the following instrument(s): Curette to remove Viable and Non-Viable tissue/material. Material removed includes Subcutaneous Tissue and Slough and after achieving pain control using Lidocaine 4% Topical Solution. No specimens were taken. A time out was conducted at 11:14, prior to the start of the procedure. A Minimum amount of bleeding was controlled with Pressure. The procedure was tolerated well. Post Debridement Measurements: 3.4cm length x 2cm width x 0.2cm depth; 1.068cm^3 volume. Character of Wound/Ulcer Post Debridement is stable. Severity of Tissue Post Debridement is: Fat layer exposed. Post procedure Diagnosis Wound #12: Same as Pre-Procedure Plan Follow-up Appointments: Chad [name], Chad [name] ([id number]) Return Appointment in 2 weeks. Bathing/ Shower/ Hygiene: Wash wounds with antibacterial soap  and water. No tub bath. WOUND #11: - Ankle Wound Laterality: Left, Medial, Proximal Cleanser: Soap and Water 3 x Per Week/30 Days Discharge Instructions: Gently cleanse wound with antibacterial soap, rinse and pat dry prior to dressing wounds Cleanser: Wound Cleanser 3 x Per Week/30 Days Discharge Instructions: Wash your hands with soap and water. Remove old dressing, discard into plastic bag and place into trash. Cleanse the wound with Wound Cleanser prior to applying a clean dressing using gauze sponges, not tissues or cotton balls. Do not scrub or use excessive force. Pat dry using gauze sponges, not tissue or cotton balls. Topical: Gentamicin 3 x Per Week/30 Days Discharge Instructions: Apply as directed by provider. Primary Dressing: Hydrofera Blue Ready Transfer Foam, 2.5x2.5 (in/in) 3 x Per Week/30 Days Discharge Instructions: Apply Hydrofera Blue Ready to wound bed as directed. If using hydrofera blue that is hard, this needs to be moistened with saline prior to application Secondary Dressing: ABD Pad 5x9 (in/in) 3 x Per Week/30 Days Discharge Instructions: Cover with ABD pad Secured With: [employer] Tape, 2x2 (in/yd) 3 x Per Week/30 Days Secured With: [employer] Sterile or Non-Sterile 6-ply 4.5x4 (yd/yd) 3 x Per Week/30 Days Discharge Instructions: Apply Kerlix as directed Secured With: Tubigrip Size C, 2.75x10 (in/yd) 3 x Per Week/30 Days Discharge Instructions: Apply 3 Tubigrip C 3-finger-widths below knee to base of toes to secure dressing and/or for swelling. This can be hand washed and air dried WOUND #12: - Ankle Wound Laterality: Left, Medial, Distal Cleanser: Soap and Water 3 x Per Week/30 Days Discharge Instructions: Gently cleanse wound with antibacterial soap, rinse and pat dry prior to dressing wounds Cleanser: Wound Cleanser 3 x Per Week/30 Days Discharge Instructions: Wash your hands with soap and water. Remove old dressing,  discard into plastic bag and place into trash. Cleanse the wound with Wound Cleanser prior to applying a clean dressing using gauze sponges, not tissues or cotton balls. Do not scrub or use excessive force. Pat dry using gauze sponges, not tissue or cotton balls. Topical: Gentamicin 3 x Per Week/30 Days Discharge Instructions: Apply as directed by provider. Primary Dressing: Hydrofera Blue Ready Transfer Foam, 2.5x2.5 (in/in) 3 x Per Week/30 Days Discharge Instructions: Apply Hydrofera Blue Ready to wound bed as directed Secondary Dressing: ABD Pad 5x9 (in/in) 3 x Per Week/30 Days Discharge Instructions: Cover with ABD pad Secured With: Medipore Tape - 84M Medipore H Soft Cloth Surgical Tape, 2x2 (in/yd) 3 x Per Week/30 Days Secured With: [employer]  or Non-Sterile 6-ply 4.5x4 (yd/yd) 3 x Per Week/30 Days Discharge Instructions: Apply Kerlix as directed Secured With: Tubigrip Size C, 2.75x10 (in/yd) 3 x Per Week/30 Days Discharge Instructions: Apply 3 Tubigrip C 3-finger-widths below knee to base of toes to secure dressing and/or for swelling. This can be hand washed and air dried 1. I am going to recommend that we go ahead and continue with the wound care measures as before utilizing [facility] which I think is doing well. 2. I am also can recommend that we continue with the ABD pad to cover followed by roll gauze to secure in place and Tubigrip size C. 3. I would also recommend that he continue to monitor for any signs of worsening or infection if anything changes he should let me know. We will see patient back for reevaluation in 2 weeks here in the clinic. If anything worsens or changes patient will contact our office for additional recommendations. Electronic Signature(s) Signed: 01/24/2022 2:58:57 PM By: Worthy Keeler PA-C Entered By: Worthy Keeler on 01/24/2022 14:58:57 Chad [name]  ([id number]) -------------------------------------------------------------------------------- SuperBill Details Patient Name: Chad [name] Date of Service: 01/24/2022 Medical Record Number: [id number] Patient Account Number: [id number] Date of Birth/Sex: 1960/11/11 (61 y.o. M) Treating RN: [name] Primary Care Provider: [name] Other Clinician: Referring Provider: [name] Treating Provider/Extender: Skipper Cliche in Treatment: 7 Diagnosis Coding ICD-10 Codes Code Description [phone] Chronic venous hypertension (idiopathic) with ulcer and inflammation of left lower extremity L97.322 Non-pressure chronic ulcer of left ankle with fat layer exposed 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 side Facility Procedures CPT4 Code: [id number] Description: 18403 - DEB SUBQ TISSUE 20 SQ CM/< Modifier: Quantity: 1 CPT4 Code: Description: ICD-10 Diagnosis Description F54.360 Non-pressure chronic ulcer of left ankle with fat layer exposed Modifier: Quantity: Physician Procedures CPT4 Code: 6770340 Description: 11042 - WC PHYS SUBQ TISS 20 SQ CM Modifier: Quantity: 1 CPT4 Code: Description: ICD-10 Diagnosis Description B52.481 Non-pressure chronic ulcer of left ankle with fat layer exposed Modifier: Quantity: Electronic Signature(s) Signed: 01/24/2022 2:59:13 PM By: Worthy Keeler PA-C Entered By: Worthy Keeler on 01/24/2022 14:59:13

## 2022-01-30 ENCOUNTER — Telehealth: Payer: Self-pay | Admitting: Internal Medicine

## 2022-01-30 ENCOUNTER — Inpatient Hospital Stay: Payer: Medicaid Other

## 2022-01-30 ENCOUNTER — Inpatient Hospital Stay: Payer: Medicaid Other | Admitting: Internal Medicine

## 2022-01-30 NOTE — Telephone Encounter (Signed)
Scheduler [name] from [employer] called in stating that pt would not make appt due to transformation.Marland KitchenKJ

## 2022-02-03 ENCOUNTER — Encounter: Payer: Self-pay | Admitting: Internal Medicine

## 2022-02-03 ENCOUNTER — Inpatient Hospital Stay: Payer: Medicaid Other

## 2022-02-03 ENCOUNTER — Inpatient Hospital Stay (HOSPITAL_BASED_OUTPATIENT_CLINIC_OR_DEPARTMENT_OTHER): Payer: Medicaid Other | Admitting: Internal Medicine

## 2022-02-03 ENCOUNTER — Other Ambulatory Visit: Payer: Self-pay

## 2022-02-03 ENCOUNTER — Inpatient Hospital Stay: Payer: Medicaid Other | Attending: Internal Medicine

## 2022-02-03 ENCOUNTER — Telehealth: Payer: Self-pay | Admitting: *Deleted

## 2022-02-03 DIAGNOSIS — Z5112 Encounter for antineoplastic immunotherapy: Secondary | ICD-10-CM | POA: Diagnosis not present

## 2022-02-03 DIAGNOSIS — C661 Malignant neoplasm of right ureter: Secondary | ICD-10-CM | POA: Diagnosis not present

## 2022-02-03 DIAGNOSIS — C187 Malignant neoplasm of sigmoid colon: Secondary | ICD-10-CM | POA: Diagnosis not present

## 2022-02-03 DIAGNOSIS — Z515 Encounter for palliative care: Secondary | ICD-10-CM

## 2022-02-03 DIAGNOSIS — D509 Iron deficiency anemia, unspecified: Secondary | ICD-10-CM | POA: Insufficient documentation

## 2022-02-03 DIAGNOSIS — Z79899 Other long term (current) drug therapy: Secondary | ICD-10-CM | POA: Diagnosis not present

## 2022-02-03 DIAGNOSIS — Z7189 Other specified counseling: Secondary | ICD-10-CM

## 2022-02-03 LAB — CBC WITH DIFFERENTIAL/PLATELET
Abs Immature Granulocytes: 0.01 10*3/uL (ref 0.00–0.07)
Basophils Absolute: 0.1 10*3/uL (ref 0.0–0.1)
Basophils Relative: 1 %
Eosinophils Absolute: 0.3 10*3/uL (ref 0.0–0.5)
Eosinophils Relative: 5 %
HCT: 36.9 % — ABNORMAL LOW (ref 39.0–52.0)
Hemoglobin: 11.7 g/dL — ABNORMAL LOW (ref 13.0–17.0)
Immature Granulocytes: 0 %
Lymphocytes Relative: 16 %
Lymphs Abs: 1.1 10*3/uL (ref 0.7–4.0)
MCH: 26.6 pg (ref 26.0–34.0)
MCHC: 31.7 g/dL (ref 30.0–36.0)
MCV: 83.9 fL (ref 80.0–100.0)
Monocytes Absolute: 0.4 10*3/uL (ref 0.1–1.0)
Monocytes Relative: 7 %
Neutro Abs: 4.6 10*3/uL (ref 1.7–7.7)
Neutrophils Relative %: 71 %
Platelets: 186 10*3/uL (ref 150–400)
RBC: 4.4 MIL/uL (ref 4.22–5.81)
RDW: 15.1 % (ref 11.5–15.5)
WBC: 6.5 10*3/uL (ref 4.0–10.5)
nRBC: 0 % (ref 0.0–0.2)

## 2022-02-03 LAB — COMPREHENSIVE METABOLIC PANEL
ALT: 44 U/L (ref 0–44)
AST: 76 U/L — ABNORMAL HIGH (ref 15–41)
Albumin: 3.3 g/dL — ABNORMAL LOW (ref 3.5–5.0)
Alkaline Phosphatase: 114 U/L (ref 38–126)
Anion gap: 9 (ref 5–15)
BUN: 22 mg/dL (ref 8–23)
CO2: 23 mmol/L (ref 22–32)
Calcium: 8.4 mg/dL — ABNORMAL LOW (ref 8.9–10.3)
Chloride: 104 mmol/L (ref 98–111)
Creatinine, Ser: 1.02 mg/dL (ref 0.61–1.24)
GFR, Estimated: >60 mL/min (ref >60)
Glucose, Bld: 124 mg/dL — ABNORMAL HIGH (ref 70–99)
Potassium: 3.7 mmol/L (ref 3.5–5.1)
Sodium: 136 mmol/L (ref 135–145)
Total Bilirubin: 0.5 mg/dL (ref 0.3–1.2)
Total Protein: 7.1 g/dL (ref 6.5–8.1)

## 2022-02-03 LAB — TSH: TSH: 4.683 u[IU]/mL — ABNORMAL HIGH (ref 0.350–4.500)

## 2022-02-03 MED ORDER — SODIUM CHLORIDE 0.9 % IV SOLN
200.0000 mg | Freq: Once | INTRAVENOUS | Status: AC
  Administered 2022-02-03: 200 mg via INTRAVENOUS
  Filled 2022-02-03: qty 8

## 2022-02-03 MED ORDER — HEPARIN SOD (PORK) LOCK FLUSH 100 UNIT/ML IV SOLN
500.0000 [IU] | Freq: Once | INTRAVENOUS | Status: AC
  Administered 2022-02-03: 500 [IU] via INTRAVENOUS
  Filled 2022-02-03: qty 5

## 2022-02-03 MED ORDER — SODIUM CHLORIDE 0.9% FLUSH
10.0000 mL | INTRAVENOUS | Status: DC | PRN
  Administered 2022-02-03: 10 mL via INTRAVENOUS
  Filled 2022-02-03: qty 10

## 2022-02-03 MED ORDER — SODIUM CHLORIDE 0.9 % IV SOLN
Freq: Once | INTRAVENOUS | Status: AC
  Filled 2022-02-03: qty 250

## 2022-02-03 NOTE — Progress Notes (Signed)
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  Is alone/in a wheelchair.  Chad [name] 61 y.o.  male above history of stage IV-ureteral cancer/history of stage III colon cancer right side; recurrent sigmoid colon cancer [un-resected/under surveillance] and multiple other comorbidities currently on Keytruda is here for follow-up/review results of the CT scan  Denies any worsening abdominal pain.  Any nausea vomiting or constipation.  No blood in stools or black-colored stools.  No hematuria.  Chronic shortness of breath.   Review of Systems  Constitutional:  Positive for malaise/fatigue.  Negative for chills, diaphoresis, fever and weight loss.  HENT:  Negative for nosebleeds and sore throat.   Eyes:  Negative for double vision.  Respiratory:  Positive for cough and shortness of breath. Negative for hemoptysis and wheezing.   Cardiovascular:  Negative for chest pain, palpitations and orthopnea.  Gastrointestinal:  Positive for constipation. Negative for abdominal pain, blood in stool, diarrhea, heartburn, melena, nausea and vomiting.  Genitourinary:  Negative for dysuria, frequency and urgency.  Musculoskeletal:  Positive for back pain and joint pain.  Skin: Negative.  Negative for itching and rash.  Neurological:  Positive for focal weakness. Negative for dizziness, tingling, weakness and headaches.       Chronic left-sided weakness upper than lower extremity.  Endo/Heme/Allergies:  Does not bruise/bleed easily.  Psychiatric/Behavioral:  Negative for depression. The patient is not nervous/anxious and does not have insomnia.      MEDICAL HISTORY:  Past Medical History:  Diagnosis Date   Anemia    Anxiety    ARF (acute respiratory failure) (HCC)    Bladder cancer (HCC)    COPD (chronic obstructive pulmonary disease) ([place])    Depression    Dysphagia    Family history of colon cancer    Family history of kidney cancer    Family history of leukemia    Family history of prostate cancer    GERD (gastroesophageal reflux disease)    Hepatitis    chronic hep c   Hydronephrosis    Hydronephrosis with ureteral stricture    Hyperlipidemia    Knee pain    Left   Malignant neoplasm of colon ([place])    Nerve pain    Peripheral vascular disease ([place])  Prostate cancer [facility])    Stroke [facility])    Ureteral cancer, right ([place])    Urinary frequency    Venous hypertension of both lower extremities     SURGICAL HISTORY: Past Surgical History:  Procedure Laterality Date   COLON SURGERY     En bloc extended right hemicolectomy 07/2017   COLONOSCOPY WITH PROPOFOL N/A 11/06/2020    Procedure: COLONOSCOPY WITH PROPOFOL;  Surgeon: Jonathon Bellows, MD;  Location: [facility] ENDOSCOPY;  Service: Gastroenterology;  Laterality: N/A;   COLONOSCOPY WITH PROPOFOL N/A 07/31/2021   Procedure: COLONOSCOPY WITH PROPOFOL;  Surgeon: Rein Bellow, MD;  Location: [facility] ENDOSCOPY;  Service: Endoscopy;  Laterality: N/A;   CYSTOSCOPY W/ RETROGRADES Right 08/30/2018   Procedure: CYSTOSCOPY WITH RETROGRADE PYELOGRAM;  Surgeon: Hollice Espy, MD;  Location: [facility] ORS;  Service: Urology;  Laterality: Right;   CYSTOSCOPY WITH STENT PLACEMENT Right 04/25/2018   Procedure: CYSTOSCOPY WITH STENT PLACEMENT;  Surgeon: Hollice Espy, MD;  Location: [facility] ORS;  Service: Urology;  Laterality: Right;   CYSTOSCOPY WITH STENT PLACEMENT Right 08/30/2018   Procedure: [place];  Surgeon: Hollice Espy, MD;  Location: [facility] ORS;  Service: Urology;  Laterality: Right;   CYSTOSCOPY WITH STENT PLACEMENT Right 03/07/2019   Procedure: CYSTOSCOPY WITH STENT Exchange;  Surgeon: Hollice Espy, MD;  Location: [facility] ORS;  Service: Urology;  Laterality: Right;   CYSTOSCOPY WITH STENT PLACEMENT Right 11/21/2019   Procedure: CYSTOSCOPY WITH STENT Exchange;  Surgeon: Hollice Espy, MD;  Location: [facility] ORS;  Service: Urology;  Laterality: Right;   LOWER EXTREMITY ANGIOGRAPHY Left 05/23/2019   Procedure: LOWER EXTREMITY ANGIOGRAPHY;  Surgeon: Algernon Huxley, MD;  Location: [place] CV LAB;  Service: Cardiovascular;  Laterality: Left;   LOWER EXTREMITY ANGIOGRAPHY Right 05/30/2019   Procedure: LOWER EXTREMITY ANGIOGRAPHY;  Surgeon: Algernon Huxley, MD;  Location: [place] CV LAB;  Service: Cardiovascular;  Laterality: Right;   LOWER EXTREMITY ANGIOGRAPHY Right 02/13/2020   Procedure: LOWER EXTREMITY ANGIOGRAPHY;  Surgeon: Algernon Huxley, MD;  Location: [place] CV LAB;  Service: Cardiovascular;  Laterality: Right;   LOWER EXTREMITY ANGIOGRAPHY Left 02/20/2020   Procedure: LOWER EXTREMITY ANGIOGRAPHY;   Surgeon: Algernon Huxley, MD;  Location: [place] CV LAB;  Service: Cardiovascular;  Laterality: Left;   PORTA CATH INSERTION N/A 02/28/2019   Procedure: PORTA CATH INSERTION;  Surgeon: Algernon Huxley, MD;  Location: [place] [facility] LAB;  Service: Cardiovascular;  Laterality: N/A;   tumor removed       SOCIAL HISTORY: Social History   Socioeconomic History   Marital status: Single    Spouse name: Not on file   Number of children: Not on file   Years of education: Not on file   Highest education level: Not on file  Occupational History   Not on file  Tobacco Use   Smoking status: Every Day    Packs/day: 1.00    Types: Cigarettes   Smokeless tobacco: Never  Vaping Use   Vaping Use: Never used  Substance and Sexual Activity   Alcohol use: Not Currently   Drug use: Not Currently   Sexual activity: Not Currently  Other Topics Concern   Not on file  Social History Narrative    used to live [place]; moved  To [place]- end of April 2019; in Nursing home; 1pp/day; quit alcohol. 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        Family- dad- prostate ca [at 61y]; brother- 58 died of prostate cancer; brother- 82-  no cancers [[place]]; sonGerald Stabs [[place]];[name]-32y prostate ca [facility] [place]]; daughter- 67 [[place]]; another daughter 54 [[place]/addict]. will refer genetics counseling. Given MSI- abnormal; highly suspicious of Lynch syndrome.  Patient's son [name] aware of high possible lynch syndrome.   Social Determinants of Health   Financial Resource Strain: Not on file  Food Insecurity: Not on file  Transportation Needs: Not on file  Physical Activity: Not on file  Stress: Not on file  Social Connections: Not on file  Intimate Partner Violence: Not on file    FAMILY HISTORY: Family History  Problem Relation Age of Onset   Prostate cancer Father 55   Cancer Brother 24       unsure type   Cancer Paternal Uncle        unsure type   Cancer Maternal Grandmother        unsure type   Cancer Paternal  Grandmother        unsure type   Kidney cancer Paternal Grandfather    Cancer Other        unsure types   Leukemia Son    Cancer Son        other cancers, possibly colon    ALLERGIES:  is allergic to penicillins.  MEDICATIONS:  Current Outpatient Medications  Medication Sig Dispense Refill   acetaminophen (TYLENOL) 325 MG tablet Take 650 mg by mouth 3 (three) times daily.      aspirin EC 81 MG tablet Take 1 tablet (81 mg total) by mouth daily. 150 tablet 2   atorvastatin (LIPITOR) 10 MG tablet Take 1 tablet (10 mg total) by mouth daily. 30 tablet 11   carboxymethylcellulose 1 % ophthalmic solution Apply 1 drop to eye 2 (two) times daily.     clopidogrel (PLAVIX) 75 MG tablet Take 1 tablet (75 mg total) by mouth daily. 30 tablet 11   cyclobenzaprine (FLEXERIL) 10 MG tablet Take 10 mg by mouth at bedtime.     escitalopram (LEXAPRO) 5 MG tablet Take 5 mg by mouth daily.     gabapentin (NEURONTIN) 100 MG capsule Take 100 mg by mouth 3 (three) times daily.     gentamicin ointment (GARAMYCIN) 0.1 % Apply 1 application. topically 3 (three) times daily.     mirtazapine (REMERON) 7.5 MG tablet Take 7.5 mg by mouth at bedtime.     Oxycodone HCl 10 MG TABS Take 10 mg by mouth every 6 (six) hours as needed for pain.     pantoprazole (PROTONIX) 40 MG tablet Take 40 mg by mouth daily.     senna-docusate (SENOKOT-S) 8.6-50 MG tablet Take 1 tablet by mouth daily.     tamsulosin (FLOMAX) 0.4 MG CAPS capsule Take 1 capsule (0.4 mg total) by mouth daily after supper. 30 capsule 3   No current facility-administered medications for this visit.   Facility-Administered Medications Ordered in Other Visits  Medication Dose Route Frequency Provider Last Rate Last Admin   heparin lock flush 100 unit/mL  500 Units Intravenous Once Cammie Sickle, MD       pembrolizumab [facility]) 200 mg in sodium chloride 0.9 % 50 mL chemo infusion  200 mg Intravenous Once Cammie Sickle, MD 116 mL/hr at  02/03/22 1119 200 mg at 02/03/22 1119   sodium chloride flush (NS) 0.9 % injection 10 mL  10 mL Intravenous PRN Cammie Sickle, MD   10 mL at 02/03/22 0850      .  PHYSICAL EXAMINATION: ECOG PERFORMANCE STATUS: 1 - Symptomatic but  completely ambulatory  Vitals:   02/03/22 0905  BP: 92/62  Pulse: 79  Resp: 18  Temp: 98.7 F (37.1 C)  SpO2: 94%   There were no vitals filed for this visit.    Physical Exam HENT:     Head: Normocephalic and atraumatic.     Mouth/Throat:     Pharynx: No oropharyngeal exudate.  Eyes:     Pupils: Pupils are equal, round, and reactive to light.  Cardiovascular:     Rate and Rhythm: Normal rate and regular rhythm.  Pulmonary:     Effort: No respiratory distress.     Breath sounds: No wheezing.  Abdominal:     General: Bowel sounds are normal. There is no distension.     Palpations: Abdomen is soft. There is no mass.     Tenderness: There is no abdominal tenderness. There is no guarding or rebound.  Musculoskeletal:        General: No tenderness. Normal range of motion.     Cervical back: Normal range of motion and neck supple.  Skin:    General: Skin is warm.     Comments: Bilateral lower extremity ulcerations noted.  Pulses intact bilaterally.  Cystlike lesion noted right elbow-nontender.  No erythema noted.  Neurological:     Mental Status: He is oriented to person, place, and time.     Comments: Sleepy but easily arousable.  Chronic weakness left upper and lower extremity.  Psychiatric:        Mood and Affect: Affect normal.      LABORATORY DATA:  I have reviewed the data as listed Lab Results  Component Value Date   WBC 6.5 02/03/2022   HGB 11.7 (L) 02/03/2022   HCT 36.9 (L) 02/03/2022   MCV 83.9 02/03/2022   PLT 186 02/03/2022   Recent Labs    12/19/21 0852 01/09/22 1004 02/03/22 0850  NA 136 136 136  K 4.1 4.1 3.7  CL 105 103 104  CO2 '23 27 23  ' GLUCOSE 117* 115* 124*  BUN '21 16 22  ' CREATININE 1.13 0.98  1.02  CALCIUM 8.8* 8.3* 8.4*  GFRNONAA >60 >60 >60  PROT 7.8 6.7 7.1  ALBUMIN 3.7 3.0* 3.3*  AST 32 31 76*  ALT 18 18 44  ALKPHOS 106 110 114  BILITOT 0.6 0.5 0.5    RADIOGRAPHIC STUDIES: I have personally reviewed the radiological images as listed and agreed with the findings in the report. CT CHEST ABDOMEN PELVIS W CONTRAST  Result Date: 01/25/2022 CLINICAL DATA:  Follow-up metastatic urothelial carcinoma and colon carcinoma. Undergoing immunotherapy. * Tracking Code: BO * EXAM: CT CHEST, ABDOMEN, AND PELVIS WITH CONTRAST TECHNIQUE: Multidetector CT imaging of the chest, abdomen and pelvis was performed following the standard protocol during bolus administration of intravenous contrast. RADIATION DOSE REDUCTION: This exam was performed according to the departmental dose-optimization program which includes automated exposure control, adjustment of the mA and/or kV according to patient size and/or use of iterative reconstruction technique. CONTRAST:  126m OMNIPAQUE IOHEXOL 300 MG/ML  SOLN COMPARISON:  09/24/2021 FINDINGS: CT CHEST FINDINGS Cardiovascular: No acute findings. Aortic and coronary atherosclerotic calcification incidentally noted. Mediastinum/Lymph Nodes: No masses or pathologically enlarged lymph nodes identified. Lungs/Pleura: No suspicious pulmonary nodules or masses identified. No evidence of infiltrate or pleural effusion. Musculoskeletal:  No suspicious bone lesions identified. CT ABDOMEN AND PELVIS FINDINGS Hepatobiliary: No masses identified. Gallbladder is unremarkable. No evidence of biliary ductal dilatation. Pancreas:  No mass or inflammatory changes. Spleen:  Within normal  limits in size and appearance. Adrenals/Urinary tract: Right ureteral stent remains in appropriate position. Right renal parenchymal atrophy is again demonstrated. Diffuse wall thickening the right renal pelvis and proximal ureter shows no significant change. No other renal masses are identified.  Calcification is again seen encompassing the distal loop of the ureteral stent within the urinary bladder. Focal soft tissue thickening along the left lateral wall of the urinary bladder is stable. Stomach/Bowel: Prior right hemicolectomy again noted. Short-segment wall thickening is again seen involving the proximal sigmoid colon, consistent with known colon carcinoma. Sub-centimeter pericolonic lymph nodes are again seen, largest measuring 9 mm on image 99/2. These are also stable. No evidence of bowel obstruction. Vascular/Lymphatic: Shotty sub-cm lymph nodes in the porta hepatis and bilateral iliac chains show no significant change. No pathologically enlarged lymph nodes identified. No acute vascular findings. Aortic atherosclerotic calcification incidentally noted. Reproductive:  No mass or other significant abnormality identified. Other:  None. Musculoskeletal:  No suspicious bone lesions identified. IMPRESSION: Stable short-segment wall thickening involving the proximal sigmoid colon, consistent with known colon carcinoma. Stable sub-centimeter pericolonic lymph nodes, highly suspicious for local metastatic disease. Stable shotty sub-cm porta hepatis and bilateral iliac lymph nodes. Stable focal soft tissue thickening along the left lateral wall of urinary bladder. Bladder carcinoma cannot be excluded. Stable diffuse wall thickening the right renal pelvis and proximal ureter, with right ureteral stent in appropriate position. Stable diffuse right renal parenchymal atrophy. No evidence of metastatic disease within the thorax. Aortic Atherosclerosis (ICD10-I70.0). Electronically Signed   By: Marlaine Hind M.D.   On: 01/25/2022 13:26    ASSESSMENT & PLAN:   Ureteral cancer, right ([place]) # High-grade urothelial cancer/cytology; likely of the right renal pelvis /upper ureter. On keytruda-CT scan- JUNE 30th, 2023- Stable a thickening of the right ureteral area- no obvious progression of disease noted on imaging.  STABLE.   # Proceed with  [place] today; Labs today reviewed; Labs today reviewed;  acceptable for treatment today. STABLE. TSH April 2023--WNL.  TSH pending today.   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  JUNE 2023 CT scan- Focal wall thickening in the proximal sigmoid colon; pericolonic lymph nodes overall stable.  I again reviewed with the patient that he is a poor candidate for surgery; s/p evaluation with Dr. Bary Castilla. STABLE.   # Iron deficiency anemia-hemoglobin 11-12; FEB 2023- iron studies-7/ferritin-18 LOW; proceed with venofer next visit  # Hypocalcemia- Mild- on Ca+vit D; JUNE 2023- 25-OH vit D levels: 30. STABLE.    #DISPOSITION:  # ADD TSH today  #  Keytruda;   # follow up in 3 weeks-;NP- labs-cbc/cmp; iron studies; ferritin. ;  Keytruda; venofer today; .  # follow up in 6  weeks-;NP- labs-cbc/cmp;  Keytruda; venofer - Dr.B.  - Dr.B.     All questions were answered. The patient knows to call the clinic with any problems, questions or concerns.    Cammie Sickle, MD 02/03/2022 11:44 AM

## 2022-02-03 NOTE — Telephone Encounter (Signed)
[facility] called to report they could not read written instructions that patient was sent back with. Confirmed with MD team that there are no changes to plan and patient to keep follow up as planned. Attempted to reach [facility] with update, no answer at this time.

## 2022-02-03 NOTE — Assessment & Plan Note (Addendum)
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 On keytruda-CT scan- JUNE 30th, 2023- Stable a thickening of the right ureteral area- no obvious progression of disease noted on imaging. STABLE.   # Proceed with  [place] today; Labs today reviewed; Labs today reviewed;  acceptable for treatment today. STABLE. TSH April 2023--WNL.  TSH pending today.   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  JUNE 2023 CT scan- Focal wall thickening in the proximal sigmoid colon; pericolonic lymph nodes overall stable.  I again reviewed with the patient that he is a poor candidate for surgery; s/p evaluation with Dr. Bary Castilla. STABLE.   # Iron deficiency anemia-hemoglobin 11-12; FEB 2023- iron studies-7/ferritin-18 LOW; proceed with venofer next visit  # Hypocalcemia- Mild- on Ca+vit D; JUNE 2023- 25-OH vit D levels: 30. STABLE.    #DISPOSITION:  # ADD TSH today  #  Keytruda;   # follow up in 3 weeks-;NP- labs-cbc/cmp; iron studies; ferritin. ;  Keytruda; venofer today; .  # follow up in 6  weeks-;NP- labs-cbc/cmp;  Keytruda; venofer - Dr.B.  - Dr.B.

## 2022-02-03 NOTE — Patient Instructions (Signed)
[facility]  Discharge Instructions: Thank you for choosing [facility] to provide your oncology and hematology care.   If you have a lab appointment with the Cancer Center, please go directly to the Cancer Center and check in at the registration area.   Wear comfortable clothing and clothing appropriate for easy access to any Portacath or PICC line.   We strive to give you quality time with your provider. You may need to reschedule your appointment if you arrive late (15 or more minutes).  Arriving late affects you and other patients whose appointments are after yours.  Also, if you miss three or more appointments without notifying the office, you may be dismissed from the clinic at the provider's discretion.      For prescription refill requests, have your pharmacy contact our office and allow 72 hours for refills to be completed.    Today you received the following chemotherapy and/or immunotherapy agents       To help prevent nausea and vomiting after your treatment, we encourage you to take your nausea medication as directed.  BELOW ARE SYMPTOMS THAT SHOULD BE REPORTED IMMEDIATELY: *FEVER GREATER THAN 100.4 F (38 C) OR HIGHER *CHILLS OR SWEATING *NAUSEA AND VOMITING THAT IS NOT CONTROLLED WITH YOUR NAUSEA MEDICATION *UNUSUAL SHORTNESS OF BREATH *UNUSUAL BRUISING OR BLEEDING *URINARY PROBLEMS (pain or burning when urinating, or frequent urination) *BOWEL PROBLEMS (unusual diarrhea, constipation, pain near the anus) TENDERNESS IN MOUTH AND THROAT WITH OR WITHOUT PRESENCE OF ULCERS (sore throat, sores in mouth, or a toothache) UNUSUAL RASH, SWELLING OR PAIN  UNUSUAL VAGINAL DISCHARGE OR ITCHING   Items with * indicate a potential emergency and should be followed up as soon as possible or go to the Emergency Department if any problems should occur.  Please show the CHEMOTHERAPY ALERT CARD or IMMUNOTHERAPY ALERT CARD at check-in to the  Emergency Department and triage nurse.  Should you have questions after your visit or need to cancel or reschedule your appointment, please contact [facility]  Dept: [phone]  and follow the prompts.  Office hours are 8:00 a.m. to 4:30 p.m. Monday - Friday. Please note that voicemails left after 4:00 p.m. may not be returned until the following business day.  We are closed weekends and major holidays. You have access to a nurse at all times for urgent questions. Please call the main number to the clinic Dept: [phone] and follow the prompts.   For any non-urgent questions, you may also contact your provider using MyChart. We now offer e-Visits for anyone 18 and older to request care online for non-urgent symptoms. For details visit [email or web address].   Also download the MyChart app! Go to the app store, search "MyChart", open the app, select [facility], and log in with your MyChart username and password.  Masks are optional in the cancer centers. If you would like for your care team to wear a mask while they are taking care of you, please let them know. For doctor visits, patients may have with them one support person who is at least 63 years old. At this time, visitors are not allowed in the infusion area. 

## 2022-02-07 ENCOUNTER — Encounter: Payer: Medicaid Other | Attending: Physician Assistant | Admitting: Physician Assistant

## 2022-02-07 DIAGNOSIS — I878 Other specified disorders of veins: Secondary | ICD-10-CM | POA: Insufficient documentation

## 2022-02-07 DIAGNOSIS — B1920 Unspecified viral hepatitis C without hepatic coma: Secondary | ICD-10-CM | POA: Diagnosis not present

## 2022-02-07 DIAGNOSIS — I87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 hypertension (idiopathic) with ulcer and inflammation of left lower extremity: Secondary | ICD-10-CM | POA: Insufficient documentation

## 2022-02-07 DIAGNOSIS — I69354 Hemiplegia and hemiparesis following cerebral infarction affecting left non-dominant side: Secondary | ICD-10-CM | POA: Diagnosis not present

## 2022-02-07 DIAGNOSIS — C689 Malignant neoplasm of urinary organ, unspecified: Secondary | ICD-10-CM | POA: Insufficient documentation

## 2022-02-07 DIAGNOSIS — L97322 Non-pressure chronic ulcer of left ankle with fat layer exposed: Secondary | ICD-10-CM | POA: Diagnosis not present

## 2022-02-07 NOTE — Progress Notes (Addendum)
BROUGHTON, EPPINGER ([id number]) Visit Report for 02/07/2022 Chief Complaint Document Details Patient Name: Chad [name], Chad [name] Date of Service: 02/07/2022 9:45 AM Medical Record Number: [id number] Patient Account Number: [id number] Date of Birth/Sex: 05-30-1961 (61 y.o. M) Treating RN: Cornell Barman Primary Care Provider: [name] Other Clinician: Referring Provider: [name] Treating Provider/Extender: Skipper Cliche in Treatment: 9 Information Obtained from: Patient Chief Complaint Left ankle ulcers Electronic SignatureSalinass) Signed: 02/07/2022 9:36:32 AM By: Worthy Keeler PA-C Entered By: Worthy Keeler on 02/07/2022 09:36:32 Chad [name]) -------------------------------------------------------------------------------- Debridement Details Patient Name: Chad [name] Date of Service: 02/07/2022 9:45 AM Medical Record Number: [id number] Patient Account Number: [id number] Date of Birth/Sex: 04-Aug-1960 (61 y.o. M) Treating RN: Cornell Barman Primary Care Provider: [name] Other Clinician: Referring Provider: [name] Treating Provider/Extender: Skipper Cliche in Treatment: 9 Debridement Performed for Wound #12 Left,Distal,Medial Ankle Assessment: Performed By: Physician Tommie Sams., PA-C Debridement Type: Debridement Severity of Tissue Pre Debridement: Fat layer exposed Level of Consciousness (Pre- Awake and Alert procedure): Pre-procedure Verification/Time Out Yes - 10:04 Taken: Total Area Debrided (L x W): 2.5 (cm) x 2 (cm) = 5 (cm) Tissue and other material Viable, Non-Viable, Slough, Subcutaneous, Biofilm, Slough debrided: Level: Skin/Subcutaneous Tissue Debridement Description: Excisional Instrument: Curette Bleeding: Minimum Hemostasis Achieved: Pressure Response to Treatment: Procedure was tolerated well Level of Consciousness (Post- Awake and Alert procedure): Post Debridement Measurements of Total Wound Length: (cm) 2.5 Width:  (cm) 2 Depth: (cm) 0.2 Volume: (cm) 0.785 Character of Wound/Ulcer Post Debridement: Stable Severity of Tissue Post Debridement: Fat layer exposed Post Procedure Diagnosis Same as Pre-procedure Electronic SignatureSalinass) Signed: 02/07/2022 4:36:28 PM By: Worthy Keeler PA-C Signed: 02/07/2022 5:00:29 PM By: Gretta Cool, BSN, RN, CWS, Kim RN, BSN Entered By: Gretta Cool, BSN, RN, CWS, Kim on 02/07/2022 10:07:12 Chad [name]) -------------------------------------------------------------------------------- HPI Details Patient Name: Chad [name] Date of Service: 02/07/2022 9:45 AM Medical Record Number: [id number] Patient Account Number: [id number] Date of Birth/Sex: 1960/11/20 (61 y.o. M) Treating RN: Cornell Barman Primary Care Provider: [name] Other Clinician: Referring Provider: [name] Treating Provider/Extender: Skipper Cliche in Treatment: 9 History of Present Illness HPI Description: 10/08/18 on evaluation today patient actually presents to our office for initial evaluation concerning wounds that he has of the bilateral lower extremities. 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 He also has bilateral venous stasis. He apparently has been homeless currently following discharge from the hospital apparently he has been placed at [facility] which is is a skilled nursing facility locally. Nonetheless fortunately he does not show any signs of infection at this time which is good news. In fact several of the wound actually appears to be showing some signs of improvement already in my pinion. There are a couple areas in the left leg in particular there likely gonna require some sharp debridement to help clear away some necrotic tissue and help with more sufficient healing. No fevers, chills, nausea, or vomiting  noted at this time. 10/15/18 on evaluation today patient actually appears to be doing very well in regard to his bilateral lower extremities. He's been tolerating the dressing changes without complication. Fortunately there does not appear to be any evidence of active infection at this time which is great news. Overall I'm actually very pleased with how this has progressed in just one visits time. Readmission: 08/14/2020 upon evaluation today patient  presents for re-evaluation here in our clinic. He is having issues with his left ankle region as well as his right toe and his right heel. He tells me that the toe and heel actually began as a area that was itching that he was scratching and then subsequently opened up into wounds. These may have been abscess areas I presume based on what I am seeing currently. With regard to his left ankle region he tells me this was a similar type occurrence although he does have venous stasis this very well may be more of a venous leg ulcer more than anything. Nonetheless I do believe that the patient would benefit from appropriate and aggressive wound care to try to help get things under better control here. He does have history of a stroke on the left side affecting him to some degree there that he is able to stand although he does have some residual weakness. Otherwise again the patient does have chronic venous insufficiency as previously noted. His arterial studies most recently obtained showed that he had an ABI on the right of 1.16 with a TBI of 0.52 and on the left and ABI of 1.14 with a TBI of 0.81. That was obtained on 06/19/2020. 08/28/2020 upon evaluation today patient appears to be doing decently well in regard to his wounds in general. He has been tolerating the dressing changes without complication. Fortunately there does not appear to be any signs of active infection which is great news. With that being said I think the [facility] is doing a good job I  would recommend that we likely continue with that currently. 09/11/2020 upon evaluation today patient's wounds did not appear to be doing too poorly but again he is not really showing signs of significant improvement with regard to any of the wounds on the right. None of them have Hydrofera Blue on them I am not exactly sure why this is not being followed as the facility did not contact us to let us know of any issues with obtaining dressings or otherwise. With that being said he is supposed to be using Hydrofera Blue on both of the wounds on the right foot as well as the ankle wound on the left side. 09/18/2020 upon evaluation today patient appears to be doing poorly with regard to his wounds. Again right now the left ankle in particular showed signs of extreme maceration. Apparently he was told by someone with staff at [facility] they could not get the [facility]. With that being said this is something that is never been relayed to [place] one way or another. Also the patient subsequently has not supposed to have a border gauze dressing on. He should have an ABD pad and roll gauze to secure as this drains much too much just to have a border gauze dressing to cover. Nonetheless the fact that they are not using the appropriate dressing is directly causing deterioration of the left ankle wound it is significantly worse today compared to what it was previous. I did attempt to call [facility] while the patient was here I called three times and got no one to even pick up the phone. After this I had my for an office coordinator call and she was able to finally get through and leave a message with the D ON as of dictation of this note which is roughly about an hour and a half later I still have not been able to speak with anyone at the facility. 09/25/2020  upon evaluation today patient actually showing signs of good improvement which is excellent news. He has been tolerating the  dressing changes without complication. Fortunately there is no signs of active infection which is great news. No fevers, chills, nausea, vomiting, or diarrhea. I do feel like the facility has been doing a much better job at taking care of him as far as the dressings are concerned. However the director of nursing never did call me back. 10/09/2020 upon evaluation today patient appears to be doing well with regard to his wound. The toe ulcer did require some debridement but the other 2 areas actually appear to be doing quite well. 10/19/2020 upon evaluation today patient actually appears to be doing very well in regard to his wounds. In fact the heel does appear to be completely healed. The toe is doing better in the medial ankle on the left is also doing better. Overall I think he is headed in the right direction. 10/26/2020 upon evaluation today patient appears to be doing well with regard to his wound. He is showing signs of improvement which is great news and overall I am very pleased with where things stand today. No fevers, chills, nausea, vomiting, or diarrhea. 11/02/2020 upon evaluation today patient appears to be doing well with regard to his wounds. He has been tolerating the dressing changes without complication overall I am extremely pleased with where things stand today. He in regard to the toe is almost completely healed and the medial ankle on the left is doing much better. 11/09/2020 upon evaluation today patient appears to be doing a little poorly in regard to his left medial ankle ulcer. Fortunately there does not appear to be any signs of systemic infection but unfortunately locally he does appear to be infected in fact he has blue-green drainage consistent with Pseudomonas. Chad [name], Chad [name] ([id number]) 11/16/2020 upon evaluation today patient appears to be doing well with regard to his wound. It actually appears to be doing better. I did place him on gentamicin cream since the Cipro was  actually resistant even though he was positive for Pseudomonas on culture. Overall I think that he does seem to be doing better though I am unsure whether or not they have actually been putting the cream on. The patient is not sure that we did talk to the nurse directly and she was going to initiate that treatment. Fortunately there does not appear to be any signs of active infection at this time. No fevers, chills, nausea, vomiting, or diarrhea. 4/28; the area on the right second toe is close to healed. Left medial ankle required debridement 12/07/2020 upon evaluation today patient appears to be doing well with regard to his wounds. In fact the right second toe appears to be completely healed which is great news. Fortunately there does not appear to be any signs of active infection at this time which is also great news. I think we can probably discontinue the gentamicin on top of everything else. 12/14/2020 upon evaluation today patient appears to be doing well with regard to his wound. He is making good progress and overall very pleased with where things stand today. There is no signs of active infection at this time which is great news. 12/28/2020 upon evaluation today patient appears to be doing well with regard to his wounds. He has been tolerating the dressing changes without complication. Fortunately there is no signs of active infection at this time. No fevers, chills, nausea, vomiting, or diarrhea. 12/28/2020 upon evaluation today patient's  wound bed actually showed signs of excellent improvement. He has great epithelization and granulation I do not see any signs of infection overall I am extremely pleased with where things stand at this point. No fevers, chills, nausea, vomiting, or diarrhea. 01/11/2021 upon evaluation today patient appears to be doing well with regard to his wound on his leg. He has been tolerating the dressing changes without complication. Fortunately there does not appear to be  any signs of active infection which is great news. No fevers, chills, nausea, vomiting, or diarrhea. 01/25/2021 upon evaluation today patient appears to be doing well with regard to his wound. He has been tolerating the dressing changes without complication. Fortunately the collagen seems to be doing a great job which is excellent news. No fevers, chills, nausea, vomiting, or diarrhea. 02/08/2021 upon evaluation today patient's wound is actually looking a little bit worse especially in the periwound compared to previous. Fortunately there does not appear to be any signs of infection which is great news with that being said he does have some irritation around the periphery of the wound which has me more concerned. He actually had a dressing on that had not been changed in 3 days. He also is supposed to have daily dressing changes. With regard to the dressing applied he had a silver alginate dressing and silver collagen is what is recommended and ordered. He also had no Desitin around the edges of the wound in the periwound region although that is on the order inspect to be done as well. In general I was very concerned I did contact [facility] actually spoke with Magda Paganini who is the scheduling individual and subsequently she stated that she would pass the information to the D ON apparently the D ON was not available to talk to me when I call today. 02/18/2021 upon evaluation today patient's wound is actually showing signs of improvement. Fortunately there does not appear to be any evidence of infection which is great news overall I am extremely pleased with where things stand today. No fevers, chills, nausea, vomiting, or diarrhea. 8/3; patient presents for 1 week follow-up. He has no issues or complaints today. He denies signs of infection. 03/11/2021 upon evaluation today patient appears to be doing well with regard to his wound. He does have a little bit of slough noted on the surface of the wound  but fortunately there does not appear to be any signs of active infection at this time. No fevers, chills, nausea, vomiting, or diarrhea. 03/18/2021 upon evaluation today patient appears to be doing well with regard to his wound. He has been tolerating the dressing changes without complication. There was a little irritation more proximal to where the wound was that was not noted last week but nonetheless this is very superficial just seems to be more irritation we just need to make sure to put a good amount of the zinc over the area in my opinion. Otherwise he does not seem to be doing significantly worse at all which is great news. 03/25/2021 upon evaluation today patient appears to be doing well with regard to his wound. He is going require some sharp debridement today to clear with some of the necrotic debris. I did perform this today without complication postdebridement wound bed appears to be doing much better this is great news. 04/08/2021 upon evaluation today patient appears to be doing decently well in regard to his wound although the overall measurement is not significantly smaller compared to previous. It is gone  down a little bit but still the facility continues to not really put the appropriate dressings in place in fact he was supposed to have collagen we think he probably had more of an allergy to At this point. Fortunately there does not appear to be any signs of active infection systemically though locally I do not see anything on initial visualization either as far as erythema or warmth. 04/15/2021 upon evaluation today patient appears to be doing well with regard to his wound. He is actually showing signs of improvement. I did place him on antibiotics last week, Cipro. He has been taking that 2 times a day and seems to be tolerating it very well. I do not see any evidence of worsening and in fact the overall appearance of the wound is smaller today which is also great news. 9/26; left  medial ankle chronic venous insufficiency wound is improved. Using Hydrofera Blue 10/10; left medial ankle chronic venous insufficiency. Wound has not changed much in appearance completely nonviable surface. Apparently there have been problems getting the right product on the wound at the facility although he came in with [facility] on today 05/14/2021 upon evaluation today patient appears to be doing well with regard to his wound. I think he is making progress here which is good news. Fortunately there does not appear to be any signs of active infection at this time. No fevers, chills, nausea, vomiting, or diarrhea. 05/20/2021 upon evaluation today patient appears to be doing well with regard to his wound. He is showing signs of good improvement which is great news. There does not appear to be any evidence of active infection which is also excellent news. No fevers, chills, nausea, vomiting, or diarrhea. 05/28/2021 upon evaluation today patient appears to be doing quite well. There does not appear to be any signs of active infection at this time which is great news. Overall I am extremely pleased with where things stand today. I think he is headed in the right direction. 06/11/2021 upon evaluation today patient appears to be doing well with regard to his left ankle ulcer and poorly in regard to the toe ulcer on the second toe right foot. This appears to show signs of joint exposure. Apparently this has been present for 1 to 2 months although he kept [facility], Chad ([id number]) forgetting to tell me about it. That is unfortunate as right now it definitely appears to be doing significantly worse than what I would like to see. There does not appear to be any signs of active infection systemically though locally I am concerned about the possibility of infection the toe is quite red. Again no one from the facility ever contacted [place] to advise that this was going on in the interim either. 06/17/2021  upon evaluation today patient presents for follow-up I did review his x-ray which showed a navicular bone fracture I am unsure of the chronicity of this. Subsequently he also had osteomyelitis of the toe which was what I was more concerned about this did not show up on x-ray but did show up on the pathology scrapings. This was listed as acute osteomyelitis. Nonetheless at this point I think that the antibiotic treatment is the best regimen to go with currently. The patient is in agreement with that plan. Nonetheless he has initially 30 days of doxycycline off likely extend that towards the end of the treatment cycle that will be around the middle of December for an additional 2 weeks. That all depends on how well  he continues to heal. Nonetheless based on what I am seeing in the foot I did want a proceed with an MRI as well which I think will be helpful to identify if there is anything else that needs to be addressed from the standpoint of infection. 06/24/2021 upon evaluation today patient appears to be doing pretty well in regards to his wounds. I think both are actually showing signs of improvement which is good I did review his MRI today which did show signs of osteomyelitis of the middle and proximal phalanx on his right foot of the affected toe. With that being said this is actually showing signs of significant improvement today already with the antibiotic therapy I think the redness is also improved. Overall I think that we just need to give this some time with appropriate wound care we will see how things go potentially hyperbarics could be considered. 07/02/2021 upon inspection today patient actually appears to be doing well in regard to his left ankle which is getting very close to complete resolution of pleased in that regard. Unfortunately he is continuing to have issues with his second toe right foot and this seems to still be very painful for him. Recommend he try something different from  the standpoint of antibiotics. 07/15/2021 upon evaluation today patient appears to be doing actually pretty well in regard to his foot. This is actually showing signs of significant improvement which is great news. Overall I feel like the patient is improving both in regard to the second toe as well as the ankle on the left. With that being said the biggest issue that I do see currently is that he is needing to have a refill of the doxycycline that we previously treated him with. He also did see podiatry they are not going to recommend any amputation at this point since he seems to be doing quite well. For that reason we just need to keep things under control from an infection standpoint. 08/01/2021 upon evaluation today patient appears to be doing well with regard to his wound. He has been tolerating the dressing changes without complication. Fortunately there does not appear to be any evidence of active infection locally nor systemically at this point. In fact I think everything is doing excellent in fact his second toe on the right foot is almost healed and the ankle on the left ankle region is actually very close to being healed as well. 08/08/2021 upon evaluation today patient appears to be doing well with regard to his wound. He has been tolerating the dressing changes without complication. Fortunately I do not see any signs of active infection at this time. Readmission: 12-06-2021 upon evaluation today patient presents for reevaluation here in the clinic he does tell me that he was being seen in facility at [place] by a provider that was coming in. He is not sure who this was. He tells me however that the wound seems to have gotten worse even compared to where it was when we last saw him at this point. With that being said I do believe that he is likely going need ongoing wound care here in the clinic and I do believe that we need to be the ones to frontline this since his wound does  seem to be getting worse not better at this point. He voiced understanding. He is also in agreement with this plan and feels more comfortable coming here she tells me. Patient's medical history really has not changed since his prior  admission he was only gone since January. 12-27-2021 upon evaluation today patient appears to be doing well with regard to his wound they did run out of the [facility] so they did not put anything on just an ABD pad with gentamicin. Still we are seeing some signs of good improvement here with some new epithelization which is great news. 01-10-2022 upon evaluation today patient appears to be doing well with regard to his wounds and he is going require some sharp debridement but overall seems to be making good progress. Fortunately I do not see any evidence of active infection locally or systemically at this time which is great news. 01-24-2022 upon evaluation today patient appears to be doing well with regard to his wound. The facility actually came and dropped him off early and he had another appointment at the hospital and then they just brought him over here and this was still hours before his appointment this afternoon. For that reason we did do our best to work him in this morning and fortunately had some space to make this happen. With that being said patient's wound does seem to be making progress here and I am very pleased in that regard I do not see any signs of active infection locally or systemically at this time. 02-07-2022 patient appears to be doing well currently in regard to his wounds. In fact one of them the more proximal is healed the distal is still open but seems to be doing excellent. Fortunately I do not see any evidence of active infection locally or systemically at this time which is great news. No fevers, chills, nausea, vomiting, or diarrhea. Electronic SignatureSalinass) Signed: 02/07/2022 2:36:18 PM By: Worthy Keeler PA-C Entered By: Worthy Keeler on 02/07/2022 14:36:18 Chad [name]) -------------------------------------------------------------------------------- Physical Exam Details Patient Name: Chad [name] Date of Service: 02/07/2022 9:45 AM Medical Record Number: [id number] Patient Account Number: [id number] Date of Birth/Sex: 19-Jan-1961 (61 y.o. M) Treating RN: Cornell Barman Primary Care Provider: [name] Other Clinician: Referring Provider: [name] Treating Provider/Extender: Skipper Cliche in Treatment: 9 Constitutional Well-nourished and well-hydrated in no acute distress. Respiratory normal breathing without difficulty. Psychiatric this patient is able to make decisions and demonstrates good insight into disease process. Alert and Oriented x 3. pleasant and cooperative. Notes Upon inspection patient's wound bed actually showed signs of good granulation and epithelization at this point. Fortunately I do not see any evidence of active infection locally or systemically which is great news and overall I am extremely pleased with where things stand. Electronic Signature[employer]inass) Signed: 02/07/2022 2:36:32 PM By: Worthy Keeler PA-C Entered By: Worthy Keeler on 02/07/2022 14:36:32 Chad [name]) -------------------------------------------------------------------------------- Physician Orders Details Patient Name: Chad [name] Date of Service: 02/07/2022 9:45 AM Medical Record Number: [id number] Patient Account Number: [id number] Date of Birth/Sex: [birth year] (61 y.o. 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 Bathing/ Shower/ Hygiene o Wash wounds with antibacterial soap and water. o No tub bath. Wound Treatment Wound #12 - Ankle Wound Laterality: Left, Medial, Distal Cleanser: Soap and Water 3 x Per Week/30  Days Discharge Instructions: Gently cleanse wound with antibacterial soap, rinse and pat dry prior to dressing wounds Cleanser: Wound Cleanser 3 x Per Week/30 Days Discharge Instructions: Wash your hands with soap and water. Remove old dressing, discard into plastic bag and place into trash. Cleanse the wound with Wound Cleanser prior to applying a clean dressing using gauze sponges, not tissues or cotton balls. Do not scrub or use excessive force. Pat dry using gauze sponges, not tissue or cotton balls. Topical: Gentamicin 3 x Per Week/30 Days Discharge Instructions: Apply as directed by provider. Primary Dressing: Hydrofera Blue Ready Transfer Foam, 2.5x2.5 (in/in) 3 x Per Week/30 Days Discharge Instructions: Apply Hydrofera Blue Ready to wound bed as directed Secondary Dressing: ABD Pad 5x9 (in/in) 3 x Per Week/30 Days Discharge Instructions: Cover with ABD pad Secured With: Medipore Tape - 22M Medipore H Soft Cloth Surgical Tape, 2x2 (in/yd) 3 x Per Week/30 Days Secured With: [employer] Sterile or Non-Sterile 6-ply 4.5x4 (yd/yd) 3 x Per Week/30 Days Discharge Instructions: Apply Kerlix as directed Secured With: Tubigrip Size C, 2.75x10 (in/yd) 3 x Per Week/30 Days Discharge Instructions: Apply 3 Tubigrip C 3-finger-widths below knee to base of toes to secure dressing and/or for swelling. This can be hand washed and air dried Electronic [employer]) Signed: 02/07/2022 4:36:28 PM By: Worthy Keeler PA-C Signed: 02/07/2022 5:00:29 PM By: Gretta Cool, BSN, RN, CWS, Kim RN, BSN Entered By: Gretta Cool, BSN, RN, CWS, Kim on  02/07/2022 10:07:46 Chad [name]) -------------------------------------------------------------------------------- Problem List Details Patient Name: Chad [name] Date of Service: 02/07/2022 9:45 AM Medical Record Number: [id number] Patient Account Number: [id number] Date of Birth/Sex: 1961-07-16 (61 y.o. 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 M) Treating RN: Cornell Barman Primary Care Provider: [name] Other Clinician: Referring Provider: [name] Treating Provider/Extender: Skipper Cliche in Treatment: 9 Subjective Chief Complaint Information obtained from Patient Left ankle ulcers History of Present Illness (HPI) 10/08/18 on evaluation today patient actually presents to our office for initial evaluation concerning wounds that he has of the bilateral lower extremities. 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 He also has bilateral venous stasis. He apparently has been homeless currently following discharge from the hospital apparently he has been placed at [facility] which is is a skilled nursing facility locally. Nonetheless fortunately he does not show any signs of infection at this time which is good news. In fact several of the wound actually appears to be showing some signs of improvement already in my pinion. There are a couple areas in the left leg in particular there likely gonna require some sharp debridement to help clear away  some necrotic tissue and help with more sufficient healing. No fevers, chills, nausea, or vomiting noted at this time. 10/15/18 on evaluation today patient actually appears to be doing very well in regard to his bilateral lower extremities. He's been tolerating the dressing changes without complication. Fortunately there does not appear to be any evidence of active infection at this time which is great news. Overall I'm actually very pleased with how this has progressed in just one visits time. Readmission: 08/14/2020 upon evaluation today patient presents for re-evaluation here in our clinic. He is having issues with his left ankle region as well as his right toe and his right heel. He tells me that the toe and heel actually began as a area that was itching that he was scratching and then subsequently opened up into wounds. These may have been abscess areas I presume based on what I am seeing currently. With regard to his left ankle region he tells me this was a similar type occurrence although he does have venous stasis this very well may be more of a venous leg ulcer more than anything. Nonetheless I do believe that the patient would benefit from appropriate and aggressive wound care to try to help get things under better control here. He does have  history of a stroke on the left side affecting him to some degree there that he is able to stand although he does have some residual weakness. Otherwise again the patient does have chronic venous insufficiency as previously noted. His arterial studies most recently obtained showed that he had an ABI on the right of 1.16 with a TBI of 0.52 and on the left and ABI of 1.14 with a TBI of 0.81. That was obtained on 06/19/2020. 08/28/2020 upon evaluation today patient appears to be doing decently well in regard to his wounds in general. He has been tolerating the dressing changes without complication. Fortunately there does not appear to be any signs of active infection which is great news. With that being said I think the [facility] is doing a good job I would recommend that we likely continue with that currently. 09/11/2020 upon evaluation today patient's wounds did not appear to be doing too poorly but again he is not really showing signs of significant improvement with regard to any of the wounds on the right. None of them have Hydrofera Blue on them I am not exactly sure why this is not being followed as the facility did not contact us to let us know of any issues with obtaining dressings or otherwise. With that being said he is supposed to be using Hydrofera Blue on both of the wounds on the right foot as well as the ankle wound on the left side. 09/18/2020 upon evaluation today patient appears to be doing poorly with regard to his wounds. Again right now the left ankle in particular showed signs of extreme maceration. Apparently he was told by someone with staff at [place] they could not get the [facility]. With that being said this is something that is never been relayed to [place] one way or another. Also the patient subsequently has not supposed to have a border gauze dressing on. He should have an ABD pad and roll gauze to secure as this drains much too much just to have a border gauze  dressing to cover. Nonetheless the fact that they are not using the appropriate dressing is directly causing deterioration of the left ankle wound it is significantly worse today compared  to what it was previous. I did attempt to call [facility] while the patient was here I called three times and got no one to even pick up the phone. After this I had my for an office coordinator call and she was able to finally get through and leave a message with the D ON as of dictation of this note which is roughly about an hour and a half later I still have not been able to speak with anyone at the facility. 09/25/2020 upon evaluation today patient actually showing signs of good improvement which is excellent news. He has been tolerating the dressing changes without complication. Fortunately there is no signs of active infection which is great news. No fevers, chills, nausea, vomiting, or diarrhea. I do feel like the facility has been doing a much better job at taking care of him as far as the dressings are concerned. However the director of nursing never did call me back. 10/09/2020 upon evaluation today patient appears to be doing well with regard to his wound. The toe ulcer did require some debridement but the other 2 areas actually appear to be doing quite well. 10/19/2020 upon evaluation today patient actually appears to be doing very well in regard to his wounds. In fact the heel does appear to be completely healed. The toe is doing better in the medial ankle on the left is also doing better. Overall I think he is headed in the right direction. 10/26/2020 upon evaluation today patient appears to be doing well with regard to his wound. He is showing signs of improvement which is great news and overall I am very pleased with where things stand today. No fevers, chills, nausea, vomiting, or diarrhea. 11/02/2020 upon evaluation today patient appears to be doing well with regard to his wounds. He has been  tolerating the dressing changes without complication overall I am extremely pleased with where things stand today. He in regard to the toe is almost completely healed and the medial [facility] ([id number]) ankle on the left is doing much better. 11/09/2020 upon evaluation today patient appears to be doing a little poorly in regard to his left medial ankle ulcer. Fortunately there does not appear to be any signs of systemic infection but unfortunately locally he does appear to be infected in fact he has blue-green drainage consistent with Pseudomonas. 11/16/2020 upon evaluation today patient appears to be doing well with regard to his wound. It actually appears to be doing better. I did place him on gentamicin cream since the Cipro was actually resistant even though he was positive for Pseudomonas on culture. Overall I think that he does seem to be doing better though I am unsure whether or not they have actually been putting the cream on. The patient is not sure that we did talk to the nurse directly and she was going to initiate that treatment. Fortunately there does not appear to be any signs of active infection at this time. No fevers, chills, nausea, vomiting, or diarrhea. 4/28; the area on the right second toe is close to healed. Left medial ankle required debridement 12/07/2020 upon evaluation today patient appears to be doing well with regard to his wounds. In fact the right second toe appears to be completely healed which is great news. Fortunately there does not appear to be any signs of active infection at this time which is also great news. I think we can probably discontinue the gentamicin on top of everything else. 12/14/2020 upon evaluation today  patient appears to be doing well with regard to his wound. He is making good progress and overall very pleased with where things stand today. There is no signs of active infection at this time which is great news. 12/28/2020 upon evaluation  today patient appears to be doing well with regard to his wounds. He has been tolerating the dressing changes without complication. Fortunately there is no signs of active infection at this time. No fevers, chills, nausea, vomiting, or diarrhea. 12/28/2020 upon evaluation today patient's wound bed actually showed signs of excellent improvement. He has great epithelization and granulation I do not see any signs of infection overall I am extremely pleased with where things stand at this point. No fevers, chills, nausea, vomiting, or diarrhea. 01/11/2021 upon evaluation today patient appears to be doing well with regard to his wound on his leg. He has been tolerating the dressing changes without complication. Fortunately there does not appear to be any signs of active infection which is great news. No fevers, chills, nausea, vomiting, or diarrhea. 01/25/2021 upon evaluation today patient appears to be doing well with regard to his wound. He has been tolerating the dressing changes without complication. Fortunately the collagen seems to be doing a great job which is excellent news. No fevers, chills, nausea, vomiting, or diarrhea. 02/08/2021 upon evaluation today patient's wound is actually looking a little bit worse especially in the periwound compared to previous. Fortunately there does not appear to be any signs of infection which is great news with that being said he does have some irritation around the periphery of the wound which has me more concerned. He actually had a dressing on that had not been changed in 3 days. He also is supposed to have daily dressing changes. With regard to the dressing applied he had a silver alginate dressing and silver collagen is what is recommended and ordered. He also had no Desitin around the edges of the wound in the periwound region although that is on the order inspect to be done as well. In general I was very concerned I did contact [facility] actually spoke  with Magda Paganini who is the scheduling individual and subsequently she stated that she would pass the information to the D ON apparently the D ON was not available to talk to me when I call today. 02/18/2021 upon evaluation today patient's wound is actually showing signs of improvement. Fortunately there does not appear to be any evidence of infection which is great news overall I am extremely pleased with where things stand today. No fevers, chills, nausea, vomiting, or diarrhea. 8/3; patient presents for 1 week follow-up. He has no issues or complaints today. He denies signs of infection. 03/11/2021 upon evaluation today patient appears to be doing well with regard to his wound. He does have a little bit of slough noted on the surface of the wound but fortunately there does not appear to be any signs of active infection at this time. No fevers, chills, nausea, vomiting, or diarrhea. 03/18/2021 upon evaluation today patient appears to be doing well with regard to his wound. He has been tolerating the dressing changes without complication. There was a little irritation more proximal to where the wound was that was not noted last week but nonetheless this is very superficial just seems to be more irritation we just need to make sure to put a good amount of the zinc over the area in my opinion. Otherwise he does not seem to be doing significantly worse at  all which is great news. 03/25/2021 upon evaluation today patient appears to be doing well with regard to his wound. He is going require some sharp debridement today to clear with some of the necrotic debris. I did perform this today without complication postdebridement wound bed appears to be doing much better this is great news. 04/08/2021 upon evaluation today patient appears to be doing decently well in regard to his wound although the overall measurement is not significantly smaller compared to previous. It is gone down a little bit but still the facility  continues to not really put the appropriate dressings in place in fact he was supposed to have collagen we think he probably had more of an allergy to At this point. Fortunately there does not appear to be any signs of active infection systemically though locally I do not see anything on initial visualization either as far as erythema or warmth. 04/15/2021 upon evaluation today patient appears to be doing well with regard to his wound. He is actually showing signs of improvement. I did place him on antibiotics last week, Cipro. He has been taking that 2 times a day and seems to be tolerating it very well. I do not see any evidence of worsening and in fact the overall appearance of the wound is smaller today which is also great news. 9/26; left medial ankle chronic venous insufficiency wound is improved. Using Hydrofera Blue 10/10; left medial ankle chronic venous insufficiency. Wound has not changed much in appearance completely nonviable surface. Apparently there have been problems getting the right product on the wound at the facility although he came in with [facility] on today 05/14/2021 upon evaluation today patient appears to be doing well with regard to his wound. I think he is making progress here which is good news. Fortunately there does not appear to be any signs of active infection at this time. No fevers, chills, nausea, vomiting, or diarrhea. 05/20/2021 upon evaluation today patient appears to be doing well with regard to his wound. He is showing signs of good improvement which is great news. There does not appear to be any evidence of active infection which is also excellent news. No fevers, chills, nausea, vomiting, or diarrhea. Chad [name], Chad [name] ([id number]) 05/28/2021 upon evaluation today patient appears to be doing quite well. There does not appear to be any signs of active infection at this time which is great news. Overall I am extremely pleased with where things stand today. I  think he is headed in the right direction. 06/11/2021 upon evaluation today patient appears to be doing well with regard to his left ankle ulcer and poorly in regard to the toe ulcer on the second toe right foot. This appears to show signs of joint exposure. Apparently this has been present for 1 to 2 months although he kept forgetting to tell me about it. That is unfortunate as right now it definitely appears to be doing significantly worse than what I would like to see. There does not appear to be any signs of active infection systemically though locally I am concerned about the possibility of infection the toe is quite red. Again no one from the facility ever contacted [place] to advise that this was going on in the interim either. 06/17/2021 upon evaluation today patient presents for follow-up I did review his x-ray which showed a navicular bone fracture I am unsure of the chronicity of this. Subsequently he also had osteomyelitis of the toe which was what I was more concerned  about this did not show up on x-ray but did show up on the pathology scrapings. This was listed as acute osteomyelitis. Nonetheless at this point I think that the antibiotic treatment is the best regimen to go with currently. The patient is in agreement with that plan. Nonetheless he has initially 30 days of doxycycline off likely extend that towards the end of the treatment cycle that will be around the middle of December for an additional 2 weeks. That all depends on how well he continues to heal. Nonetheless based on what I am seeing in the foot I did want a proceed with an MRI as well which I think will be helpful to identify if there is anything else that needs to be addressed from the standpoint of infection. 06/24/2021 upon evaluation today patient appears to be doing pretty well in regards to his wounds. I think both are actually showing signs of improvement which is good I did review his MRI today which did show signs of  osteomyelitis of the middle and proximal phalanx on his right foot of the affected toe. With that being said this is actually showing signs of significant improvement today already with the antibiotic therapy I think the redness is also improved. Overall I think that we just need to give this some time with appropriate wound care we will see how things go potentially hyperbarics could be considered. 07/02/2021 upon inspection today patient actually appears to be doing well in regard to his left ankle which is getting very close to complete resolution of pleased in that regard. Unfortunately he is continuing to have issues with his second toe right foot and this seems to still be very painful for him. Recommend he try something different from the standpoint of antibiotics. 07/15/2021 upon evaluation today patient appears to be doing actually pretty well in regard to his foot. This is actually showing signs of significant improvement which is great news. Overall I feel like the patient is improving both in regard to the second toe as well as the ankle on the left. With that being said the biggest issue that I do see currently is that he is needing to have a refill of the doxycycline that we previously treated him with. He also did see podiatry they are not going to recommend any amputation at this point since he seems to be doing quite well. For that reason we just need to keep things under control from an infection standpoint. 08/01/2021 upon evaluation today patient appears to be doing well with regard to his wound. He has been tolerating the dressing changes without complication. Fortunately there does not appear to be any evidence of active infection locally nor systemically at this point. In fact I think everything is doing excellent in fact his second toe on the right foot is almost healed and the ankle on the left ankle region is actually very close to being healed as well. 08/08/2021 upon  evaluation today patient appears to be doing well with regard to his wound. He has been tolerating the dressing changes without complication. Fortunately I do not see any signs of active infection at this time. Readmission: 12-06-2021 upon evaluation today patient presents for reevaluation here in the clinic he does tell me that he was being seen in facility at [place] by a provider that was coming in. He is not sure who this was. He tells me however that the wound seems to have gotten worse even compared to where it was  when we last saw him at this point. With that being said I do believe that he is likely going need ongoing wound care here in the clinic and I do believe that we need to be the ones to frontline this since his wound does seem to be getting worse not better at this point. He voiced understanding. He is also in agreement with this plan and feels more comfortable coming here she tells me. Patient's medical history really has not changed since his prior admission he was only gone since January. 12-27-2021 upon evaluation today patient appears to be doing well with regard to his wound they did run out of the [facility] so they did not put anything on just an ABD pad with gentamicin. Still we are seeing some signs of good improvement here with some new epithelization which is great news. 01-10-2022 upon evaluation today patient appears to be doing well with regard to his wounds and he is going require some sharp debridement but overall seems to be making good progress. Fortunately I do not see any evidence of active infection locally or systemically at this time which is great news. 01-24-2022 upon evaluation today patient appears to be doing well with regard to his wound. The facility actually came and dropped him off early and he had another appointment at the hospital and then they just brought him over here and this was still hours before his appointment this afternoon. For  that reason we did do our best to work him in this morning and fortunately had some space to make this happen. With that being said patient's wound does seem to be making progress here and I am very pleased in that regard I do not see any signs of active infection locally or systemically at this time. 02-07-2022 patient appears to be doing well currently in regard to his wounds. In fact one of them the more proximal is healed the distal is still open but seems to be doing excellent. Fortunately I do not see any evidence of active infection locally or systemically at this time which is great news. No fevers, chills, nausea, vomiting, or diarrhea. Objective Chad [name], Chad [name] ([id number]) Constitutional Well-nourished and well-hydrated in no acute distress. Vitals Time Taken: 9:35 AM, Height: 67 in, Weight: 150 lbs, BMI: 23.5, Temperature: 98.3 F, Pulse: 86 bpm, Respiratory Rate: 16 breaths/min, Blood Pressure: 104/70 mmHg. Respiratory normal breathing without difficulty. Psychiatric this patient is able to make decisions and demonstrates good insight into disease process. Alert and Oriented x 3. pleasant and cooperative. General Notes: Upon inspection patient's wound bed actually showed signs of good granulation and epithelization at this point. Fortunately I do not see any evidence of active infection locally or systemically which is great news and overall I am extremely pleased with where things stand. Integumentary (Hair, Skin) Wound #11 status is Healed - Epithelialized. Original cause of wound was Gradually Appeared. The date acquired was: 11/06/2021. The wound has been in treatment 9 weeks. The wound is located on the Left,Proximal,Medial Ankle. The wound measures 0cm length x 0cm width x 0cm depth; 0cm^2 area and 0cm^3 volume. There is Fat Layer (Subcutaneous Tissue) exposed. There is a medium amount of serosanguineous drainage noted. There is medium (34-66%) pink, pale granulation within  the wound bed. There is a medium (34-66%) amount of necrotic tissue within the wound bed including Adherent Slough. Wound #12 status is Open. Original cause of wound was Gradually Appeared. The date acquired was: 07/12/2019. The wound has been  in treatment 9 weeks. The wound is located on the Left,Distal,Medial Ankle. The wound measures 2.5cm length x 2cm width x 0.2cm depth; 3.927cm^2 area and 0.785cm^3 volume. There is Fat Layer (Subcutaneous Tissue) exposed. There is a small amount of serosanguineous drainage noted. There is small (1-33%) pink granulation within the wound bed. There is a large (67-100%) amount of necrotic tissue within the wound bed including Adherent Slough. 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 There was a Excisional Skin/Subcutaneous Tissue Debridement with a total area of 5 sq cm performed by Tommie Sams., PA-C. With the following instrumentSalinass): Curette to remove Viable and Non-Viable tissue/material. Material removed includes Subcutaneous Tissue, Slough, and Biofilm. No specimens were taken. A time out was conducted at 10:04, prior to the start of the procedure. A Minimum amount of bleeding was controlled with Pressure. The procedure was tolerated well. Post Debridement Measurements: 2.5cm length x 2cm width x 0.2cm depth; 0.785cm^3 volume. Character of Wound/Ulcer Post Debridement is stable. Severity of Tissue Post Debridement is: Fat layer exposed. Post procedure Diagnosis Wound #12: Same as Pre-Procedure Plan Follow-up Appointments: Return  Appointment in 2 weeks. Bathing/ Shower/ Hygiene: Wash wounds with antibacterial soap and water. No tub bath. WOUND #12: - Ankle Wound Laterality: Left, Medial, Distal Cleanser: Soap and Water 3 x Per Week/30 Days Discharge Instructions: Gently cleanse wound with antibacterial soap, rinse and pat dry prior to dressing wounds Chad [name], Chad [name] ([id number]) Cleanser: Wound Cleanser 3 x Per Week/30 Days Discharge Instructions: Wash your hands with soap and water. Remove old dressing, discard into plastic bag and place into trash. Cleanse the wound with Wound Cleanser prior to applying a clean dressing using gauze sponges, not tissues or cotton balls. Do not scrub or use excessive force. Pat dry using gauze sponges, not tissue or cotton balls. Topical: Gentamicin 3 x Per Week/30 Days Discharge Instructions: Apply as directed by provider. Primary Dressing: Hydrofera Blue Ready Transfer Foam, 2.5x2.5 (in/in) 3 x Per Week/30 Days Discharge Instructions: Apply Hydrofera Blue Ready to wound bed as directed Secondary Dressing: ABD Pad 5x9 (in/in) 3 x Per Week/30 Days Discharge Instructions: Cover with ABD pad Secured With: Medipore Tape - 69M Medipore H Soft Cloth Surgical Tape, 2x2 (in/yd) 3 x Per Week/30 Days Secured With: [employer] Sterile or Non-Sterile 6-ply 4.5x4 (yd/yd) 3 x Per Week/30 Days Discharge Instructions: Apply Kerlix as directed Secured With: Tubigrip Size C, 2.75x10 (in/yd) 3 x Per Week/30 Days Discharge Instructions: Apply 3 Tubigrip C 3-finger-widths below knee to base of toes to secure dressing and/or for swelling. This can be hand washed and air dried 1. I would recommend that we go ahead and continue with the wound care measures as before and the patient is in agreement with plan. This includes the use of the [facility] dressing which I think is doing a great job. 2. We will continue with the gentamicin as well ABD pad to cover at all. 3. We will continue to use a roll  gauze to secure in place and Tubigrip size C. We will see patient back for reevaluation in 1 week here in the clinic. If anything worsens or changes patient will contact our office  for additional recommendations. Electronic SignatureSalinass) Signed: 02/07/2022 2:37:04 PM By: Worthy Keeler PA-C Entered By: Worthy Keeler on 02/07/2022 14:37:04 Chad [name]) -------------------------------------------------------------------------------- SuperBill Details Patient Name: Chad [name] Date of Service: 02/07/2022 Medical Record Number: [id number] Patient Account Number: [id number] Date of Birth/Sex: 07/25/1961 (61 y.o. M) Treating RN: Cornell Barman Primary Care Provider: [name] Other Clinician: Referring Provider: [name] Treating Provider/Extender: Skipper Cliche in Treatment: 9 Diagnosis Coding ICD-10 Codes Code Description [phone] Chronic venous hypertension (idiopathic) with ulcer and inflammation of left lower extremity L97.322 Non-pressure chronic ulcer of left ankle with fat layer exposed 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 side Facility Procedures CPT4 Code: 31517616 Description: 07371 - DEB SUBQ TISSUE 20 SQ CM/< Modifier: Quantity: 1 CPT4 Code: Description: ICD-10 Diagnosis Description G62.694 Non-pressure chronic ulcer of left ankle with fat layer exposed Modifier: Quantity: Physician Procedures CPT4 Code: 8546270 Description: 11042 - WC PHYS SUBQ TISS 20 SQ CM Modifier: Quantity: 1 CPT4 Code: Description: ICD-10 Diagnosis Description J50.093 Non-pressure chronic ulcer of left ankle with fat layer exposed Modifier: Quantity: Electronic SignatureSalinass) Signed: 02/07/2022 2:38:55 PM By: Worthy Keeler PA-C Entered By: Worthy Keeler on 02/07/2022 14:38:55

## 2022-02-07 NOTE — Progress Notes (Signed)
KALE, RONDEAU ([id number]) Visit Report for 02/07/2022 Arrival Information Details Patient Name: Chad [name], Chad [name] Date of Service: 02/07/2022 9:45 AM Medical Record Number: [id number] Patient Account Number: [id number] Date of Birth/Sex: [birth year] (61 y.o. M) Treating [name]: Chad [name] Primary Care Chad [name]: Chad [name] Other Clinician: Referring Chad [name]: Chad [name] Treating Chad [name]/Extender: Chad [name] in Treatment: 9 Visit Information History Since Last Visit Added or deleted any medications: No Patient Arrived: Wheel Chair Has Dressing in Place as Prescribed: Yes Arrival Time: 09:33 Pain Present Now: No Transfer Assistance: Manual Patient Identification Verified: Yes Secondary Verification Process Completed: Yes Patient Requires Transmission-Based No Precautions: Patient Has Alerts: Yes Patient Alerts: Patient on Blood Thinner NOT diabetic aspirin [facility] Electronic Signature(s) Signed: 02/07/2022 5:00:29 PM By: Chad [name] BSN, [name], CWS, Chad [name], BSN Entered By: Chad [name] BSN, [name], CWS, Chad on 02/07/2022 09:35:18 [name]([id number] -------------------------------------------------------------------------------- Encounter Discharge Information Details Patient Name: LLawerance SabalDate of Service: 02/07/2022 9:45 AM Medical Record Number: [id number] Account Number: [id number] of Birth/Sex: 3February 19, 1962(61 y.o. M) Treating [name]: WCornell BarmanPrimary Care Chad [name]: LErik ObeyOther Clinician: Referring Kemar Pandit: LErik ObeyTreating Chad [name]/Extender: SSkipper Clichein Treatment: 9 Encounter Discharge Information Items Post Procedure Vitals Discharge Condition: Stable Temperature (F): 98.3 Ambulatory Status: Wheelchair Pulse (bpm): 86 Discharge Destination: Home Respiratory Rate (breaths/min): 16 Transportation: Private Auto Blood Pressure (mmHg): 104/70 Schedule Follow-up Appointment: Yes Clinical Summary of  Care: Electronic Signature(s) Signed: 02/07/2022 5:00:29 PM By: Chad [name] BSN, [name], CWS, Chad [name], BSN Entered By: Chad [name] BSN, [name], CWS, Chad on 02/07/2022 10:09:20 [name]([id number] -------------------------------------------------------------------------------- Lower Extremity Assessment Details Patient Name: LLawerance SabalDate of Service: 02/07/2022 9:45 AM Medical Record Number: [id number] Account Number: [id number] of Birth/Sex: 308/27/1962(61 y.o. M) Treating [name]: WCornell BarmanPrimary Care Chad [name]: LErik ObeyOther Clinician: Referring Chad [name]: LErik ObeyTreating Chad [name]/Extender: SJeri CosWeeks in Treatment: 9 Edema Assessment Assessed: [Left: No] [Right: No] [Left: Edema] [Right: :] Calf Left: Right: Point of Measurement: 31 cm From Medial Instep 34 cm Ankle Left: Right: Point of Measurement: 11 cm From Medial Instep 22.2 cm Vascular Assessment Pulses: Dorsalis Pedis Palpable: [Left:Yes] Electronic Signature(s) Signed: 02/07/2022 5:00:29 PM By: Chad [name] BSN, [name], CWS, Chad [name], BSN Entered By: Chad [name] BSN, [name], CWS, Chad on 02/07/2022 09:46:23 [name]([id number] -------------------------------------------------------------------------------- Multi Wound Chart Details Patient Name: LLawerance SabalDate of Service: 02/07/2022 9:45 AM Medical Record Number: [id number] Account Number: [id number] of Birth/Sex: 306/06/1961(61 y.o. M) Treating [name]: WCornell BarmanPrimary Care Chad [name]: LErik ObeyOther Clinician: Referring Chad [name]: LErik ObeyTreating Chad [name]/Extender: SSkipper Clichein Treatment: 9 Vital Signs Height(in): 68Pulse(bpm): 855Weight(lbs): 150 Blood Pressure(mmHg): 104/70 Body Mass Index(BMI): 23.5 Temperature(F): 98.3 Respiratory Rate(breaths/min): 16 Photos: [N/A:N/A] Wound Location: Left, Proximal, Medial Ankle Left, Distal, Medial Ankle N/A Wounding Event: Gradually Appeared Gradually Appeared  N/A Primary Etiology: Venous Leg Ulcer Venous Leg Ulcer N/A Comorbid History: Anemia, Chronic Obstructive Anemia, Chronic Obstructive N/A Pulmonary Disease (COPD), Pulmonary Disease (COPD), Coronary Artery Disease, Peripheral Coronary Artery Disease, Peripheral Arterial Disease, Peripheral Venous Arterial Disease, Peripheral Venous Disease, Hepatitis C, Osteoarthritis, Disease, Hepatitis C, Osteoarthritis, Neuropathy, Received Neuropathy, Received Chemotherapy Chemotherapy Date Acquired: 11/06/2021 07/12/2019 N/A Weeks of Treatment: 9 9 N/A Wound Status: Healed - Epithelialized Open N/A Wound Recurrence: No No N/A Measurements L x W x D (cm) 0x0x0 2.5x2x0.2 N/A Area (cm) : 0 3.927 N/A Volume (cm) : 0 0.785 N/A % Reduction in Area: 100.00% 43.20% N/A % Reduction in Volume:  100.00% 43.20% N/A Classification: Full Thickness Without Exposed Full Thickness Without Exposed N/A Support Structures Support Structures Exudate Amount: Medium Small N/A Exudate Type: Serosanguineous Serosanguineous N/A Exudate Color: red, brown red, brown N/A Granulation Amount: Medium (34-66%) Small (1-33%) N/A Granulation Quality: Pink, Pale Pink N/A Necrotic Amount: Medium (34-66%) Large (67-100%) N/A Exposed Structures: Fat Layer (Subcutaneous Tissue): Fat Layer (Subcutaneous Tissue): N/A Yes Yes Fascia: No Fascia: No Tendon: No Tendon: No Muscle: No Muscle: No Joint: No Joint: No Bone: No Bone: No Epithelialization: Small (1-33%) Small (1-33%) N/A Treatment Notes Electronic Signature(s) Signed: 02/07/2022 5:00:29 PM By: Gretta [name], BSN, [name], CWS, Chad [name], BSN [place], Elsa ([id number]) Entered By: Gretta [name], BSN, [name], CWS, Chad on 02/07/2022 09:48:30 Chad [name] ([id number]) -------------------------------------------------------------------------------- Multi-Disciplinary Care Plan Details Patient Name: Chad [name] Date of Service: 02/07/2022 9:45 AM Medical Record Number: [id number] Patient  Account Number: [id number] Date of Birth/Sex: 09-Jun-1961 (61 y.o. M) Treating [name]: Chad [name] Primary Care Aubreana Cornacchia: Chad [name] Other Clinician: Referring Jermon Chalfant: Chad [name] Treating Kahlee Metivier/Extender: Chad [name] in Treatment: 9 Active Inactive Necrotic Tissue Nursing Diagnoses: Impaired tissue integrity related to necrotic/devitalized tissue Knowledge deficit related to management of necrotic/devitalized tissue Goals: Necrotic/devitalized tissue will be minimized in the wound bed Date Initiated: 02/07/2022 Target Resolution Date: 02/07/2022 Goal Status: Active Patient/caregiver will verbalize understanding of reason and process for debridement of necrotic tissue Date Initiated: 02/07/2022 Target Resolution Date: 02/07/2022 Goal Status: Active Interventions: Assess patient pain level pre-, during and post procedure and prior to discharge Provide education on necrotic tissue and debridement process Treatment Activities: Excisional debridement : 02/07/2022 Notes: Venous Leg Ulcer Nursing Diagnoses: Knowledge deficit related to disease process and management Goals: Patient will maintain optimal edema control Date Initiated: 12/06/2021 Date Inactivated: 12/27/2021 Target Resolution Date: 01/03/2022 Goal Status: Met Patient/caregiver will verbalize understanding of disease process and disease management Date Initiated: 12/06/2021 Target Resolution Date: 01/03/2022 Goal Status: Active Interventions: Assess peripheral edema status every visit. Compression as ordered Notes: Electronic Signature(s) Signed: 02/07/2022 5:00:29 PM By: Gretta [name], BSN, [name], CWS, Chad [name], BSN Entered By: Gretta [name], BSN, [name], CWS, Chad on 02/07/2022 09:48:14 Chad [name] ([id number]) -------------------------------------------------------------------------------- Pain Assessment Details Patient Name: Chad [name] Date of Service: 02/07/2022 9:45 AM Medical Record Number: [id number] Patient Account Number:  [id number] Date of Birth/Sex: [birth year] (61 y.o. M) Treating [name]: Chad [name] Primary Care Gearldean Lomanto: Chad [name] Other Clinician: Referring Evah Rashid: Chad [name] Treating Lamiah Marmol/Extender: Chad [name] in Treatment: 9 Active Problems Location of Pain Severity and Description of Pain Patient Has Paino No Site Locations Pain Management and Medication Current Pain Management: Notes Patient denies pain at this time. Electronic Signature(s) Signed: 02/07/2022 5:00:29 PM By: Gretta [name], BSN, [name], CWS, Chad [name], BSN Entered By: Gretta [name], BSN, [name], CWS, Chad on 02/07/2022 09:36:48 Chad [name] ([id number]) -------------------------------------------------------------------------------- Patient/Caregiver Education Details Patient Name: Chad [name] Date of Service: 02/07/2022 9:45 AM Medical Record Number: [id number] Patient Account Number: [id number] Date of Birth/Gender: 06-06-1961 (61 y.o. M) Treating [name]: Chad [name] Primary Care Physician: Chad [name] Other Clinician: Referring Physician: Erik [name] Treating Physician/Extender: Chad [name] in Treatment: 9 Education Assessment Education Provided To: Patient Education Topics Provided Wound/Skin Impairment: Handouts: Other: orders sent with patient Electronic Signature(s) Signed: 02/07/2022 5:00:29 PM By: Gretta [name], BSN, [name], CWS, Chad [name], BSN Entered By: Gretta [name], BSN, [name], CWS, Chad on 02/07/2022 10:08:22 Chad [name] ([id number]) -------------------------------------------------------------------------------- Wound Assessment Details Patient Name: Chad [name] Date of Service: 02/07/2022 9:45 AM Medical Record Number: [id number] Patient Account Number: [id number] Date of Birth/Sex: 1961-03-18 (61 y.o. M) Treating [name]:  Chad [name] Primary Care Savier Trickett: Chad [name] Other Clinician: Referring Nasiir Monts: Chad [name] Treating Kentavius Dettore/Extender: Jeri Cos Weeks in Treatment: 9 Wound Status Wound Number: 11 Primary Venous Leg  Ulcer Etiology: Wound Location: Left, Proximal, Medial Ankle Wound Healed - Epithelialized Wounding Event: Gradually Appeared Status: Date Acquired: 11/06/2021 Comorbid Anemia, Chronic Obstructive Pulmonary Disease (COPD), Weeks Of Treatment: 9 History: Coronary Artery Disease, Peripheral Arterial Disease, Clustered Wound: No Peripheral Venous Disease, Hepatitis C, Osteoarthritis, Neuropathy, Received Chemotherapy Photos Wound Measurements Length: (cm) 0 Width: (cm) 0 Depth: (cm) 0 Area: (cm) Volume: (cm) % Reduction in Area: 100% % Reduction in Volume: 100% Epithelialization: Small (1-33%) 0 0 Wound Description Classification: Full Thickness Without Exposed Support Structu Exudate Amount: Medium Exudate Type: Serosanguineous Exudate Color: red, brown res Foul Odor After Cleansing: No Slough/Fibrino Yes Wound Bed Granulation Amount: Medium (34-66%) Exposed Structure Granulation Quality: Pink, Pale Fascia Exposed: No Necrotic Amount: Medium (34-66%) Fat Layer (Subcutaneous Tissue) Exposed: Yes Necrotic Quality: Adherent Slough Tendon Exposed: No Muscle Exposed: No Joint Exposed: No Bone Exposed: No Treatment Notes Wound #11 (Ankle) Wound Laterality: Left, Medial, Proximal Cleanser Peri-Wound Care Topical JULLIEN, GRANQUIST ([id number]) Primary Dressing Secondary Dressing Secured With Compression Wrap Compression Stockings Add-Ons Electronic Signature(s) Signed: 02/07/2022 5:00:29 PM By: Gretta [name], BSN, [name], CWS, Chad [name], BSN Entered By: Gretta [name], BSN, [name], CWS, Chad on 02/07/2022 09:44:43 Chad [name] ([id number]) -------------------------------------------------------------------------------- Wound Assessment Details Patient Name: Chad [name] Date of Service: 02/07/2022 9:45 AM Medical Record Number: [id number] Patient Account Number: [id number] Date of Birth/Sex: 1960/12/16 (61 y.o. M) Treating [name]: Chad [name] Primary Care Lj Miyamoto: Chad [name] Other  Clinician: Referring Victoria Euceda: Chad [name] Treating Glora Hulgan/Extender: Jeri Cos Weeks in Treatment: 9 Wound Status Wound Number: 12 Primary Venous Leg Ulcer Etiology: Wound Location: Left, Distal, Medial Ankle Wound Open Wounding Event: Gradually Appeared Status: Date Acquired: 07/12/2019 Comorbid Anemia, Chronic Obstructive Pulmonary Disease (COPD), Weeks Of Treatment: 9 History: Coronary Artery Disease, Peripheral Arterial Disease, Clustered Wound: No Peripheral Venous Disease, Hepatitis C, Osteoarthritis, Neuropathy, Received Chemotherapy Photos Wound Measurements Length: (cm) 2.5 Width: (cm) 2 Depth: (cm) 0.2 Area: (cm) 3.927 Volume: (cm) 0.785 % Reduction in Area: 43.2% % Reduction in Volume: 43.2% Epithelialization: Small (1-33%) Wound Description Classification: Full Thickness Without Exposed Support Structu Exudate Amount: Small Exudate Type: Serosanguineous Exudate Color: red, brown res Foul Odor After Cleansing: No Slough/Fibrino Yes Wound Bed Granulation Amount: Small (1-33%) Exposed Structure Granulation Quality: Pink Fascia Exposed: No Necrotic Amount: Large (67-100%) Fat Layer (Subcutaneous Tissue) Exposed: Yes Necrotic Quality: Adherent Slough Tendon Exposed: No Muscle Exposed: No Joint Exposed: No Bone Exposed: No Treatment Notes Wound #12 (Ankle) Wound Laterality: Left, Medial, Distal Cleanser Soap and Water Discharge Instruction: Gently cleanse wound with antibacterial soap, rinse and pat dry prior to dressing wounds Wound Cleanser JACERE, PANGBORN ([id number]) Discharge Instruction: Wash your hands with soap and water. Remove old dressing, discard into plastic bag and place into trash. Cleanse the wound with Wound Cleanser prior to applying a clean dressing using gauze sponges, not tissues or cotton balls. Do not scrub or use excessive force. Pat dry using gauze sponges, not tissue or cotton balls. Peri-Wound  Care Topical Gentamicin Discharge Instruction: Apply as directed by Sayra Frisby. Primary Dressing Hydrofera Blue Ready Transfer Foam, 2.5x2.5 (in/in) Discharge Instruction: Apply Hydrofera Blue Ready to wound bed as directed Secondary Dressing ABD Pad 5x9 (in/in) Discharge Instruction: Cover with ABD pad Secured With Medipore Tape - 4M Medipore H Soft Cloth Surgical Tape, 2x2 (in/yd) Kerlix Roll Sterile or Non-Sterile 6-ply 4.5x4 (yd/yd) Discharge  Instruction: Apply Kerlix as directed Tubigrip Size C, 2.75x10 (in/yd) Discharge Instruction: Apply 3 Tubigrip C 3-finger-widths below knee to base of toes to secure dressing and/or for swelling. This can be hand washed and air dried Compression Wrap Compression Stockings Add-Ons Electronic Signature(s) Signed: 02/07/2022 5:00:29 PM By: Gretta [name], BSN, [name], CWS, Chad [name], BSN Entered By: Gretta [name], BSN, [name], CWS, Chad on 02/07/2022 09:45:20 Chad [name] ([id number]) -------------------------------------------------------------------------------- Vitals Details Patient Name: Chad [name] Date of Service: 02/07/2022 9:45 AM Medical Record Number: [id number] Patient Account Number: [id number] Date of Birth/Sex: Aug 18, 1960 (61 y.o. M) Treating [name]: Chad [name] Primary Care Giovannina Mun: Chad [name] Other Clinician: Referring Jamarrius Salay: Chad [name] Treating Shanise Balch/Extender: Chad [name] in Treatment: 9 Vital Signs Time Taken: 09:35 Temperature (F): 98.3 Height (in): 67 Pulse (bpm): 86 Weight (lbs): 150 Respiratory Rate (breaths/min): 16 Body Mass Index (BMI): 23.5 Blood Pressure (mmHg): 104/70 Reference Range: 80 - 120 mg / dl Electronic Signature(s) Signed: 02/07/2022 5:00:29 PM By: Gretta [name], BSN, [name], CWS, Chad [name], BSN Entered By: Gretta [name], BSN, [name], CWS, Chad on 02/07/2022 09:36:21

## 2022-02-14 ENCOUNTER — Non-Acute Institutional Stay: Payer: Medicaid Other | Admitting: Nurse Practitioner

## 2022-02-14 DIAGNOSIS — G4700 Insomnia, unspecified: Secondary | ICD-10-CM

## 2022-02-14 DIAGNOSIS — R630 Anorexia: Secondary | ICD-10-CM

## 2022-02-14 DIAGNOSIS — G894 Chronic pain syndrome: Secondary | ICD-10-CM

## 2022-02-14 DIAGNOSIS — Z515 Encounter for palliative care: Secondary | ICD-10-CM

## 2022-02-15 ENCOUNTER — Encounter: Payer: Self-pay | Admitting: Nurse Practitioner

## 2022-02-15 NOTE — Progress Notes (Signed)
[facility] Palliative Care Consult Note Telephone: [phone]  Fax: [phone]    Date of encounter: 02/15/22 9:51 AM PATIENT NAME: Chad [name] [id number]   [phone] (home)  DOB: September 19, 1960 MRN: [id number] PRIMARY CARE PROVIDER:    Demetrius Charity, MD,  [address] [place] [address] [phone]  REFERRING PROVIDER:   Demetrius Charity, [address] [place],  [place] [address] [phone]  RESPONSIBLE PARTY:    Contact Information     Name Relation Home Work Mobile   [name], [name]   [phone]     I met face to face with patient in facility. Palliative Care was asked to follow this patient by consultation request of  [facility] to address advance care planning and complex medical decision making. This is a follow up visit.                                  ASSESSMENT AND PLAN / RECOMMENDATIONS: Symptom Management/Plan: 1. 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  12/30/2021 weight 148.8 lbs 02/03/2022 weight 154.5 lbs BMI 24.2   3. Chronic pain reviewed, discussed and educated continue to monitor on pain scale, monitor efficacy vs adverse side effects.Will continue to monitor; encouraged to ask when in pain, Continue to follow by psychotherapy and psychiatry for counseling; supportive services. Continue with Oxycodone 45m q6 prn  4. Insomnia reviewed sleep patterns, discussed sleep hygiene. Received remeron 7.524mqhs though may consider melatonin if not improving   5. Palliative care encounter; Palliative medicine team will continue to support patient, patient's family, and medical team. 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 Return 8 weeks or prn. I spent 45 minutes providing this consultation starting at 11:45 am. More than 50% of the time in this consultation was spent in counseling and care coordination. PPS: 50% Chief Complaint: Follow up palliative consult for complex medical decision  making HISTORY OF PRESENT ILLNESS:  Chad [name] is a 61 y.o. year old male  with multiple medical problems including   late onset CVA, urothelial cancer, prostate cancer, chronic hepatitis C, hyperlipidemia, colon surgery, protein calorie malnutrition, anxiety, depression. Chad [name] continues to reside at [facility]. Chad [name] transfers himself to w/c, requires  assistance for ADL's. Chad [name] feeds himself with fair to declined appetite. Staff endorses Chad [name] continues to go to smoking patio daily, sleeping more. I visited and observed Chad [name]. We talked about how he has been feeling. We talked about symptoms, ros, medical goals. We talked about appetite, constipation, fatigue, importance of energy conservation. We talked about treatments, appointment with Oncology, continued with keytruda with venofer (IDA), stable to receive 7/10/203 for high grade urothelial cancer. We talked about sigmoid colon cancer, noted on CT 12/2021 focal wall thickening in proximal sigmoid colon, pericolonic lymph nodes stable, discussed poor candidate for sgy s/p evaluation with Dr Byrnett. Follow up in 3 weeks for las. Follow up 6 weeks for labs; keytruda/venofer. We talked about pain currently in joints and back with current regimen with controlled. We talked about chronic cough with intermit sob with smoking, declined smoking cessation. We talked about medical goals. We talked about quality of life, how he feels about his cancer. We talked about importance of sleep, discussed sleep patterns, sleep hygiene. Has been having more difficulty with sleep. We talked about role pc in poc. Chad [name] thankful for supportive pc visit today. Chad [name] verbalized understanding. We talked about role PC in poc. Updated staff. No changes.    History obtained from review of EMR, discussion with staff and Chad [name].  I reviewed available labs, medications, imaging, studies and related documents from the EMR.  Records reviewed and summarized above.    ROS 10 point system reviewed all negative except HPI   Exam Constitutional: NAD General: frail appearing, pleasant male EYES: anicteric sclera, lids intact, no discharge  ENMT: intact hearing, oral mucous membranes moist, dentition intact CV: S1S2, RRR Pulmonary: decreased bases, few exp wheeze Abdomen: soft and non  tender MSK: w/d dependent, +Edema BLE Skin: warm and dry Neuro:  left hemiplegia,  no cognitive impairment Psych: non-anxious affect, A and O x 3  Thank you for the opportunity to participate in the care of Chad [name].  The palliative care team will continue to follow. Please call our office at [phone] if we can be of additional assistance.    Z , NP    

## 2022-02-20 ENCOUNTER — Encounter: Payer: Medicaid Other | Admitting: Internal Medicine

## 2022-02-20 DIAGNOSIS — I87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 hypertension (idiopathic) with ulcer and inflammation of left lower extremity: Secondary | ICD-10-CM | POA: Diagnosis not present

## 2022-02-20 NOTE — Progress Notes (Signed)
Chad [name], Chad [name] ([id number]) Visit Report for 02/20/2022 HPI Details Patient Name: Chad [name] Date of Service: 02/20/2022 9:45 AM Medical Record Number: [id number] Patient Account Number: [id number] Date of Birth/Sex: 1961-06-26 (61 y.o. M) Treating RN: Chad [name] Primary Care Provider: Erik [name] Other Clinician: Massie [name] Referring Provider: Erik [name] Treating Provider/Extender: Chad [name] in Treatment: 10 History of Present Illness HPI Description: 10/08/18 on evaluation today patient actually presents to our office for initial evaluation concerning wounds that he has of the bilateral lower extremities. 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 He also has bilateral venous stasis. He apparently has been homeless currently following discharge from the hospital apparently he has been placed at [facility] which is is a skilled nursing facility locally. Nonetheless fortunately he does not show any signs of infection at this time which is good news. In fact several of the wound actually appears to be showing some signs of improvement already in my pinion. There are a couple areas in the left leg in particular there likely gonna require some sharp debridement to help clear away some necrotic tissue and help with more sufficient healing. No fevers, chills, nausea, or vomiting noted at this time. 10/15/18 on evaluation today patient actually appears to be doing very well in regard to his bilateral lower extremities. He's been tolerating the dressing changes without complication. Fortunately there does not appear to be any evidence of active infection at this time which is great news. Overall I'm actually very pleased with how this has progressed in just one visits time. Readmission: 08/14/2020 upon evaluation  today patient presents for re-evaluation here in our clinic. He is having issues with his left ankle region as well as his right toe and his right heel. He tells me that the toe and heel actually began as a area that was itching that he was scratching and then subsequently opened up into wounds. These may have been abscess areas I presume based on what I am seeing currently. With regard to his left ankle region he tells me this was a similar type occurrence although he does have venous stasis this very well may be more of a venous leg ulcer more than anything. Nonetheless I do believe that the patient would benefit from appropriate and aggressive wound care to try to help get things under better control here. He does have history of a stroke on the left side affecting him to some degree there that he is able to stand although he does have some residual weakness. Otherwise again the patient does have chronic venous insufficiency as previously noted. His arterial studies most recently obtained showed that he had an ABI on the right of 1.16 with a TBI of 0.52 and on the left and ABI of 1.14 with a TBI of 0.81. That was obtained on 06/19/2020. 08/28/2020 upon evaluation today patient appears to be doing decently well in regard to his wounds in general. He has been tolerating the dressing changes without complication. Fortunately there does not appear to be any signs of active infection which is great news. With that being said I think the [facility] is doing a good job I would recommend that we likely continue with that currently. 09/11/2020 upon evaluation today patient's wounds did not appear to be doing too poorly but again he is not really showing signs of  significant improvement with regard to any of the wounds on the right. None of them have Hydrofera Blue on them I am not exactly sure why this is not being followed as the facility did not contact us to let us know of any issues with obtaining  dressings or otherwise. With that being said he is supposed to be using Hydrofera Blue on both of the wounds on the right foot as well as the ankle wound on the left side. 09/18/2020 upon evaluation today patient appears to be doing poorly with regard to his wounds. Again right now the left ankle in particular showed signs of extreme maceration. Apparently he was told by someone with staff at [facility] they could not get the [facility]. With that being said this is something that is never been relayed to [place] one way or another. Also the patient subsequently has not supposed to have a border gauze dressing on. He should have an ABD pad and roll gauze to secure as this drains much too much just to have a border gauze dressing to cover. Nonetheless the fact that they are not using the appropriate dressing is directly causing deterioration of the left ankle wound it is significantly worse today compared to what it was previous. I did attempt to call [facility] while the patient was here I called three times and got no one to even pick up the phone. After this I had my for an office coordinator call and she was able to finally get through and leave a message with the D ON as of dictation of this note which is roughly about an hour and a half later I still have not been able to speak with anyone at the facility. 09/25/2020 upon evaluation today patient actually showing signs of good improvement which is excellent news. He has been tolerating the dressing changes without complication. Fortunately there is no signs of active infection which is great news. No fevers, chills, nausea, vomiting, or diarrhea. I do feel like the facility has been doing a much better job at taking care of him as far as the dressings are concerned. However the director of nursing never did call me back. 10/09/2020 upon evaluation today patient appears to be doing well with regard to his wound. The toe ulcer did  require some debridement but the other 2 areas actually appear to be doing quite well. 10/19/2020 upon evaluation today patient actually appears to be doing very well in regard to his wounds. In fact the heel does appear to be completely healed. The toe is doing better in the medial ankle on the left is also doing better. Overall I think he is headed in the right direction. 10/26/2020 upon evaluation today patient appears to be doing well with regard to his wound. He is showing signs of improvement which is great news and overall I am very pleased with where things stand today. No fevers, chills, nausea, vomiting, or diarrhea. 11/02/2020 upon evaluation today patient appears to be doing well with regard to his wounds. He has been tolerating the dressing changes without complication overall I am extremely pleased with where things stand today. He in regard to the toe is almost completely healed and the medial ankle on the left is doing much better. Chad [name], Chad [name] ([id number]) 11/09/2020 upon evaluation today patient appears to be doing a little poorly in regard to his left medial ankle ulcer. Fortunately there does not appear to be any signs of systemic infection but unfortunately locally  he does appear to be infected in fact he has blue-green drainage consistent with Pseudomonas. 11/16/2020 upon evaluation today patient appears to be doing well with regard to his wound. It actually appears to be doing better. I did place him on gentamicin cream since the Cipro was actually resistant even though he was positive for Pseudomonas on culture. Overall I think that he does seem to be doing better though I am unsure whether or not they have actually been putting the cream on. The patient is not sure that we did talk to the nurse directly and she was going to initiate that treatment. Fortunately there does not appear to be any signs of active infection at this time. No fevers, chills, nausea, vomiting, or  diarrhea. 4/28; the area on the right second toe is close to healed. Left medial ankle required debridement 12/07/2020 upon evaluation today patient appears to be doing well with regard to his wounds. In fact the right second toe appears to be completely healed which is great news. Fortunately there does not appear to be any signs of active infection at this time which is also great news. I think we can probably discontinue the gentamicin on top of everything else. 12/14/2020 upon evaluation today patient appears to be doing well with regard to his wound. He is making good progress and overall very pleased with where things stand today. There is no signs of active infection at this time which is great news. 12/28/2020 upon evaluation today patient appears to be doing well with regard to his wounds. He has been tolerating the dressing changes without complication. Fortunately there is no signs of active infection at this time. No fevers, chills, nausea, vomiting, or diarrhea. 12/28/2020 upon evaluation today patient's wound bed actually showed signs of excellent improvement. He has great epithelization and granulation I do not see any signs of infection overall I am extremely pleased with where things stand at this point. No fevers, chills, nausea, vomiting, or diarrhea. 01/11/2021 upon evaluation today patient appears to be doing well with regard to his wound on his leg. He has been tolerating the dressing changes without complication. Fortunately there does not appear to be any signs of active infection which is great news. No fevers, chills, nausea, vomiting, or diarrhea. 01/25/2021 upon evaluation today patient appears to be doing well with regard to his wound. He has been tolerating the dressing changes without complication. Fortunately the collagen seems to be doing a great job which is excellent news. No fevers, chills, nausea, vomiting, or diarrhea. 02/08/2021 upon evaluation today patient's wound is  actually looking a little bit worse especially in the periwound compared to previous. Fortunately there does not appear to be any signs of infection which is great news with that being said he does have some irritation around the periphery of the wound which has me more concerned. He actually had a dressing on that had not been changed in 3 days. He also is supposed to have daily dressing changes. With regard to the dressing applied he had a silver alginate dressing and silver collagen is what is recommended and ordered. He also had no Desitin around the edges of the wound in the periwound region although that is on the order inspect to be done as well. In general I was very concerned I did contact [facility] actually spoke with Magda Paganini who is the scheduling individual and subsequently she stated that she would pass the information to the D ON apparently the D ON  was not available to talk to me when I call today. 02/18/2021 upon evaluation today patient's wound is actually showing signs of improvement. Fortunately there does not appear to be any evidence of infection which is great news overall I am extremely pleased with where things stand today. No fevers, chills, nausea, vomiting, or diarrhea. 8/3; patient presents for 1 week follow-up. He has no issues or complaints today. He denies signs of infection. 03/11/2021 upon evaluation today patient appears to be doing well with regard to his wound. He does have a little bit of slough noted on the surface of the wound but fortunately there does not appear to be any signs of active infection at this time. No fevers, chills, nausea, vomiting, or diarrhea. 03/18/2021 upon evaluation today patient appears to be doing well with regard to his wound. He has been tolerating the dressing changes without complication. There was a little irritation more proximal to where the wound was that was not noted last week but nonetheless this is very superficial just  seems to be more irritation we just need to make sure to put a good amount of the zinc over the area in my opinion. Otherwise he does not seem to be doing significantly worse at all which is great news. 03/25/2021 upon evaluation today patient appears to be doing well with regard to his wound. He is going require some sharp debridement today to clear with some of the necrotic debris. I did perform this today without complication postdebridement wound bed appears to be doing much better this is great news. 04/08/2021 upon evaluation today patient appears to be doing decently well in regard to his wound although the overall measurement is not significantly smaller compared to previous. It is gone down a little bit but still the facility continues to not really put the appropriate dressings in place in fact he was supposed to have collagen we think he probably had more of an allergy to At this point. Fortunately there does not appear to be any signs of active infection systemically though locally I do not see anything on initial visualization either as far as erythema or warmth. 04/15/2021 upon evaluation today patient appears to be doing well with regard to his wound. He is actually showing signs of improvement. I did place him on antibiotics last week, Cipro. He has been taking that 2 times a day and seems to be tolerating it very well. I do not see any evidence of worsening and in fact the overall appearance of the wound is smaller today which is also great news. 9/26; left medial ankle chronic venous insufficiency wound is improved. Using Hydrofera Blue 10/10; left medial ankle chronic venous insufficiency. Wound has not changed much in appearance completely nonviable surface. Apparently there have been problems getting the right product on the wound at the facility although he came in with [facility] on today 05/14/2021 upon evaluation today patient appears to be doing well with regard to his  wound. I think he is making progress here which is good news. Fortunately there does not appear to be any signs of active infection at this time. No fevers, chills, nausea, vomiting, or diarrhea. 05/20/2021 upon evaluation today patient appears to be doing well with regard to his wound. He is showing signs of good improvement which is great news. There does not appear to be any evidence of active infection which is also excellent news. No fevers, chills, nausea, vomiting, or diarrhea. 05/28/2021 upon evaluation today patient appears to  be doing quite well. There does not appear to be any signs of active infection at this time Chad [name], Chad [name] ([id number]) which is great news. Overall I am extremely pleased with where things stand today. I think he is headed in the right direction. 06/11/2021 upon evaluation today patient appears to be doing well with regard to his left ankle ulcer and poorly in regard to the toe ulcer on the second toe right foot. This appears to show signs of joint exposure. Apparently this has been present for 1 to 2 months although he kept forgetting to tell me about it. That is unfortunate as right now it definitely appears to be doing significantly worse than what I would like to see. There does not appear to be any signs of active infection systemically though locally I am concerned about the possibility of infection the toe is quite red. Again no one from the facility ever contacted [place] to advise that this was going on in the interim either. 06/17/2021 upon evaluation today patient presents for follow-up I did review his x-ray which showed a navicular bone fracture I am unsure of the chronicity of this. Subsequently he also had osteomyelitis of the toe which was what I was more concerned about this did not show up on x-ray but did show up on the pathology scrapings. This was listed as acute osteomyelitis. Nonetheless at this point I think that the antibiotic treatment is the best  regimen to go with currently. The patient is in agreement with that plan. Nonetheless he has initially 30 days of doxycycline off likely extend that towards the end of the treatment cycle that will be around the middle of December for an additional 2 weeks. That all depends on how well he continues to heal. Nonetheless based on what I am seeing in the foot I did want a proceed with an MRI as well which I think will be helpful to identify if there is anything else that needs to be addressed from the standpoint of infection. 06/24/2021 upon evaluation today patient appears to be doing pretty well in regards to his wounds. I think both are actually showing signs of improvement which is good I did review his MRI today which did show signs of osteomyelitis of the middle and proximal phalanx on his right foot of the affected toe. With that being said this is actually showing signs of significant improvement today already with the antibiotic therapy I think the redness is also improved. Overall I think that we just need to give this some time with appropriate wound care we will see how things go potentially hyperbarics could be considered. 07/02/2021 upon inspection today patient actually appears to be doing well in regard to his left ankle which is getting very close to complete resolution of pleased in that regard. Unfortunately he is continuing to have issues with his second toe right foot and this seems to still be very painful for him. Recommend he try something different from the standpoint of antibiotics. 07/15/2021 upon evaluation today patient appears to be doing actually pretty well in regard to his foot. This is actually showing signs of significant improvement which is great news. Overall I feel like the patient is improving both in regard to the second toe as well as the ankle on the left. With that being said the biggest issue that I do see currently is that he is needing to have a refill of the  doxycycline that we previously treated him with. He also  did see podiatry they are not going to recommend any amputation at this point since he seems to be doing quite well. For that reason we just need to keep things under control from an infection standpoint. 08/01/2021 upon evaluation today patient appears to be doing well with regard to his wound. He has been tolerating the dressing changes without complication. Fortunately there does not appear to be any evidence of active infection locally nor systemically at this point. In fact I think everything is doing excellent in fact his second toe on the right foot is almost healed and the ankle on the left ankle region is actually very close to being healed as well. 08/08/2021 upon evaluation today patient appears to be doing well with regard to his wound. He has been tolerating the dressing changes without complication. Fortunately I do not see any signs of active infection at this time. Readmission: 12-06-2021 upon evaluation today patient presents for reevaluation here in the clinic he does tell me that he was being seen in facility at [facility] by a provider that was coming in. He is not sure who this was. He tells me however that the wound seems to have gotten worse even compared to where it was when we last saw him at this point. With that being said I do believe that he is likely going need ongoing wound care here in the clinic and I do believe that we need to be the ones to frontline this since his wound does seem to be getting worse not better at this point. He voiced understanding. He is also in agreement with this plan and feels more comfortable coming here she tells me. Patient's medical history really has not changed since his prior admission he was only gone since January. 12-27-2021 upon evaluation today patient appears to be doing well with regard to his wound they did run out of the [facility] so they did not put anything on  just an ABD pad with gentamicin. Still we are seeing some signs of good improvement here with some new epithelization which is great news. 01-10-2022 upon evaluation today patient appears to be doing well with regard to his wounds and he is going require some sharp debridement but overall seems to be making good progress. Fortunately I do not see any evidence of active infection locally or systemically at this time which is great news. 01-24-2022 upon evaluation today patient appears to be doing well with regard to his wound. The facility actually came and dropped him off early and he had another appointment at the hospital and then they just brought him over here and this was still hours before his appointment this afternoon. For that reason we did do our best to work him in this morning and fortunately had some space to make this happen. With that being said patient's wound does seem to be making progress here and I am very pleased in that regard I do not see any signs of active infection locally or systemically at this time. 02-07-2022 patient appears to be doing well currently in regard to his wounds. In fact one of them the more proximal is healed the distal is still open but seems to be doing excellent. Fortunately I do not see any evidence of active infection locally or systemically at this time which is great news. No fevers, chills, nausea, vomiting, or diarrhea. 7/27; left medial ankle venous. Improving per our intake nurse. We are using Hydrofera Blue under Tubigrip compression. They  are changing that at his facility Electronic Signature(s) Signed: 02/20/2022 4:27:18 PM By: Linton Ham MD Entered By: Linton Ham on 02/20/2022 09:49:29 Chad [name] ([id number]) -------------------------------------------------------------------------------- Physical Exam Details Patient Name: Chad [name] Date of Service: 02/20/2022 9:45 AM Medical Record Number: [id number] Patient Account  Number: [id number] Date of Birth/Sex: 1961/06/20 (61 y.o. M) Treating RN: Chad [name] Primary Care Provider: Erik [name] Other Clinician: Massie [name] Referring Provider: Erik [name] Treating Provider/Extender: Chad [name] in Treatment: 10 Constitutional Sitting or standing Blood Pressure is within target range for patient.. Pulse regular and within target range for patient.Marland Kitchen Respirations regular, non- labored and within target range.. Temperature is normal and within the target range for the patient.Marland Kitchen appears in no distress. Notes Wound exam; under illumination the wound bed looks reasonable. No debridement no evidence of surrounding infection. Dorsalis pedis pulses palpable edema control seems adequate Electronic Signature(s) Signed: 02/20/2022 4:27:18 PM By: Linton Ham MD Entered By: Linton Ham on 02/20/2022 09:50:30 Chad [name] ([id number]) -------------------------------------------------------------------------------- Physician Orders Details Patient Name: Chad [name] Date of Service: 02/20/2022 9:45 AM Medical Record Number: [id number] Patient Account Number: [id number] Date of Birth/Sex: 10-15-1960 (61 y.o. M) Treating RN: Carlene Coria Primary Care Provider: Erik [name] Other Clinician: Massie [name] Referring Provider: Erik [name] Treating Provider/Extender: Chad [name] in Treatment: 10 Verbal / Phone Orders: No Diagnosis Coding Follow-up Appointments o Return Appointment in 2 weeks. Bathing/ Shower/ Hygiene o Wash wounds with antibacterial soap and water. o No tub bath. Wound Treatment Wound #12 - Ankle Wound Laterality: Left, Medial, Distal Cleanser: Soap and Water 3 x Per Week/30 Days Discharge Instructions: Gently cleanse wound with antibacterial soap, rinse and pat dry prior to dressing wounds Cleanser: Wound Cleanser 3 x Per Week/30 Days Discharge Instructions: Wash your hands with soap and water. Remove old  dressing, discard into plastic bag and place into trash. Cleanse the wound with Wound Cleanser prior to applying a clean dressing using gauze sponges, not tissues or cotton balls. Do not scrub or use excessive force. Pat dry using gauze sponges, not tissue or cotton balls. Topical: Gentamicin 3 x Per Week/30 Days Discharge Instructions: Apply as directed by provider. Primary Dressing: Hydrofera Blue Ready Transfer Foam, 2.5x2.5 (in/in) 3 x Per Week/30 Days Discharge Instructions: Apply Hydrofera Blue Ready to wound bed as directed Secondary Dressing: ABD Pad 5x9 (in/in) 3 x Per Week/30 Days Discharge Instructions: Cover with ABD pad Secured With: Medipore Tape - 24M Medipore H Soft Cloth Surgical Tape, 2x2 (in/yd) 3 x Per Week/30 Days Secured With: [employer] Sterile or Non-Sterile 6-ply 4.5x4 (yd/yd) 3 x Per Week/30 Days Discharge Instructions: Apply Kerlix as directed Secured With: Tubigrip Size C, 2.75x10 (in/yd) 3 x Per Week/30 Days Discharge Instructions: Apply 3 Tubigrip C 3-finger-widths below knee to base of toes to secure dressing and/or for swelling. This can be hand washed and air dried Electronic Signature(s) Signed: 02/20/2022 4:27:18 PM By: Linton Ham MD Signed: 02/20/2022 4:52:51 PM By: Carlene Coria RN Entered By: Carlene Coria on 02/20/2022 09:48:47 Chad [name] ([id number]) -------------------------------------------------------------------------------- Problem List Details Patient Name: Chad [name] Date of Service: 02/20/2022 9:45 AM Medical Record Number: [id number] Patient Account Number: [id number] Date of Birth/Sex: 10-08-1960 (61 y.o. 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 M) Treating RN: Chad [name] Primary Care Provider: Erik [name] Other Clinician: Massie [name] Referring Provider: Erik [name] Treating Provider/Extender: Chad [name] in Treatment: 10 Subjective History of Present Illness (HPI) 10/08/18 on evaluation today patient actually presents to our office for initial evaluation concerning wounds that he has of the bilateral lower extremities. 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 He also has bilateral venous stasis. He apparently has been homeless currently following discharge from the hospital apparently he has been placed at [facility] which is is a skilled nursing facility locally. Nonetheless fortunately he does not show any signs of infection at this time which is good news. In fact several of the wound actually appears to be showing some signs of improvement already in my pinion. There are a couple areas in the left leg in particular there likely gonna require some sharp debridement to help clear away some necrotic tissue  and help with more sufficient healing. No fevers, chills, nausea, or vomiting noted at this time. 10/15/18 on evaluation today patient actually appears to be doing very well in regard to his bilateral lower extremities. He's been tolerating the dressing changes without complication. Fortunately there does not appear to be any evidence of active infection at this time which is great news. Overall I'm actually very pleased with how this has progressed in just one visits time. Readmission: 08/14/2020 upon evaluation today patient presents for re-evaluation here in our clinic. He is having issues with his left ankle region as well as his right toe and his right heel. He tells me that the toe and heel actually began as a area that was itching that he was scratching and then subsequently opened up into wounds. These may have been abscess areas I presume based on what I am seeing currently. With regard to his left ankle region he tells me this was a similar type occurrence although he does have venous stasis this very well may be more of a venous leg ulcer more than anything. Nonetheless I do believe that the patient would benefit from appropriate and aggressive wound care to try to help get things under better control here. He does have history of a stroke on the left side affecting him to some degree there that he is able to stand although he does have some residual weakness. Otherwise again the patient does have chronic venous insufficiency as previously noted. His arterial studies most recently obtained showed that he had an ABI on the right of 1.16 with a TBI of 0.52 and on the left and ABI of 1.14 with a TBI of 0.81. That was obtained on 06/19/2020. 08/28/2020 upon evaluation today patient appears to be doing decently well in regard to his wounds in general. He has been tolerating the dressing changes without complication. Fortunately there does not appear to be any signs of active infection which is  great news. With that being said I think the [facility] is doing a good job I would recommend that we likely continue with that currently. 09/11/2020 upon evaluation today patient's wounds did not appear to be doing too poorly but again he is not really showing signs of significant improvement with regard to any of the wounds on the right. None of them have Hydrofera Blue on them I am not exactly sure why this is not being followed as the facility did not contact us to let us know of any issues with obtaining dressings or otherwise. With that being said he is supposed to be using Hydrofera Blue on both of the wounds on the right foot as well as the ankle wound on the left side. 09/18/2020 upon evaluation today patient appears to be doing poorly with regard to his wounds. Again right now the left ankle in particular showed signs of extreme maceration. Apparently he was told by someone with staff at [facility] they could not get the [facility]. With that being said this is something that is never been relayed to [place] one way or another. Also the patient subsequently has not supposed to have a border gauze dressing on. He should have an ABD pad and roll gauze to secure as this drains much too much just to have a border gauze dressing to cover. Nonetheless the fact that they are not using the appropriate dressing is directly causing deterioration of the left ankle wound it is significantly worse today compared to what it was previous. I did attempt to call [facility] while the patient was here I called three times and got no one to even pick up the phone. After this I had my for an office coordinator call and she was able to finally get through and leave a message with the D ON as of dictation of this note which is roughly about an hour and a half later I still have not been able to speak with anyone at the facility. 09/25/2020 upon evaluation today patient actually showing signs of  good improvement which is excellent news. He has been tolerating the dressing changes without complication. Fortunately there is no signs of active infection which is great news. No fevers, chills, nausea, vomiting, or diarrhea. I do feel like the facility has been doing a much better job at taking care of him as far as the dressings are concerned. However the director of nursing never did call me back. 10/09/2020 upon evaluation today patient appears to be doing well with regard to his wound. The toe ulcer did require some debridement but the other 2 areas actually appear to be doing quite well. 10/19/2020 upon evaluation today patient actually appears to be doing very well in regard to his wounds. In fact the heel does appear to be completely healed. The toe is doing better in the medial ankle on the left is also doing better. Overall I think he is headed in the right direction. 10/26/2020 upon evaluation today patient appears to be doing well with regard to his wound. He is showing signs of improvement which is great news and overall I am very pleased with where things stand today. No fevers, chills, nausea, vomiting, or diarrhea. 11/02/2020 upon evaluation today patient appears to be doing well with regard to his wounds. He has been tolerating the dressing changes without complication overall I am extremely pleased with where things stand today. He in regard to the toe is almost completely healed and  the medial ankle on the left is doing much better. 11/09/2020 upon evaluation today patient appears to be doing a little poorly in regard to his left medial ankle ulcer. Fortunately there does not appear to be any signs of systemic infection but unfortunately locally he does appear to be infected in fact he has blue-green drainage consistent with Pseudomonas. Chad [name], Chad [name] ([id number]) 11/16/2020 upon evaluation today patient appears to be doing well with regard to his wound. It actually appears to be  doing better. I did place him on gentamicin cream since the Cipro was actually resistant even though he was positive for Pseudomonas on culture. Overall I think that he does seem to be doing better though I am unsure whether or not they have actually been putting the cream on. The patient is not sure that we did talk to the nurse directly and she was going to initiate that treatment. Fortunately there does not appear to be any signs of active infection at this time. No fevers, chills, nausea, vomiting, or diarrhea. 4/28; the area on the right second toe is close to healed. Left medial ankle required debridement 12/07/2020 upon evaluation today patient appears to be doing well with regard to his wounds. In fact the right second toe appears to be completely healed which is great news. Fortunately there does not appear to be any signs of active infection at this time which is also great news. I think we can probably discontinue the gentamicin on top of everything else. 12/14/2020 upon evaluation today patient appears to be doing well with regard to his wound. He is making good progress and overall very pleased with where things stand today. There is no signs of active infection at this time which is great news. 12/28/2020 upon evaluation today patient appears to be doing well with regard to his wounds. He has been tolerating the dressing changes without complication. Fortunately there is no signs of active infection at this time. No fevers, chills, nausea, vomiting, or diarrhea. 12/28/2020 upon evaluation today patient's wound bed actually showed signs of excellent improvement. He has great epithelization and granulation I do not see any signs of infection overall I am extremely pleased with where things stand at this point. No fevers, chills, nausea, vomiting, or diarrhea. 01/11/2021 upon evaluation today patient appears to be doing well with regard to his wound on his leg. He has been tolerating the  dressing changes without complication. Fortunately there does not appear to be any signs of active infection which is great news. No fevers, chills, nausea, vomiting, or diarrhea. 01/25/2021 upon evaluation today patient appears to be doing well with regard to his wound. He has been tolerating the dressing changes without complication. Fortunately the collagen seems to be doing a great job which is excellent news. No fevers, chills, nausea, vomiting, or diarrhea. 02/08/2021 upon evaluation today patient's wound is actually looking a little bit worse especially in the periwound compared to previous. Fortunately there does not appear to be any signs of infection which is great news with that being said he does have some irritation around the periphery of the wound which has me more concerned. He actually had a dressing on that had not been changed in 3 days. He also is supposed to have daily dressing changes. With regard to the dressing applied he had a silver alginate dressing and silver collagen is what is recommended and ordered. He also had no Desitin around the edges of the wound in the periwound region although that  is on the order inspect to be done as well. In general I was very concerned I did contact [facility] actually spoke with Magda Paganini who is the scheduling individual and subsequently she stated that she would pass the information to the D ON apparently the D ON was not available to talk to me when I call today. 02/18/2021 upon evaluation today patient's wound is actually showing signs of improvement. Fortunately there does not appear to be any evidence of infection which is great news overall I am extremely pleased with where things stand today. No fevers, chills, nausea, vomiting, or diarrhea. 8/3; patient presents for 1 week follow-up. He has no issues or complaints today. He denies signs of infection. 03/11/2021 upon evaluation today patient appears to be doing well with regard to  his wound. He does have a little bit of slough noted on the surface of the wound but fortunately there does not appear to be any signs of active infection at this time. No fevers, chills, nausea, vomiting, or diarrhea. 03/18/2021 upon evaluation today patient appears to be doing well with regard to his wound. He has been tolerating the dressing changes without complication. There was a little irritation more proximal to where the wound was that was not noted last week but nonetheless this is very superficial just seems to be more irritation we just need to make sure to put a good amount of the zinc over the area in my opinion. Otherwise he does not seem to be doing significantly worse at all which is great news. 03/25/2021 upon evaluation today patient appears to be doing well with regard to his wound. He is going require some sharp debridement today to clear with some of the necrotic debris. I did perform this today without complication postdebridement wound bed appears to be doing much better this is great news. 04/08/2021 upon evaluation today patient appears to be doing decently well in regard to his wound although the overall measurement is not significantly smaller compared to previous. It is gone down a little bit but still the facility continues to not really put the appropriate dressings in place in fact he was supposed to have collagen we think he probably had more of an allergy to At this point. Fortunately there does not appear to be any signs of active infection systemically though locally I do not see anything on initial visualization either as far as erythema or warmth. 04/15/2021 upon evaluation today patient appears to be doing well with regard to his wound. He is actually showing signs of improvement. I did place him on antibiotics last week, Cipro. He has been taking that 2 times a day and seems to be tolerating it very well. I do not see any evidence of worsening and in fact the  overall appearance of the wound is smaller today which is also great news. 9/26; left medial ankle chronic venous insufficiency wound is improved. Using Hydrofera Blue 10/10; left medial ankle chronic venous insufficiency. Wound has not changed much in appearance completely nonviable surface. Apparently there have been problems getting the right product on the wound at the facility although he came in with [facility] on today 05/14/2021 upon evaluation today patient appears to be doing well with regard to his wound. I think he is making progress here which is good news. Fortunately there does not appear to be any signs of active infection at this time. No fevers, chills, nausea, vomiting, or diarrhea. 05/20/2021 upon evaluation today patient appears to be  doing well with regard to his wound. He is showing signs of good improvement which is great news. There does not appear to be any evidence of active infection which is also excellent news. No fevers, chills, nausea, vomiting, or diarrhea. 05/28/2021 upon evaluation today patient appears to be doing quite well. There does not appear to be any signs of active infection at this time which is great news. Overall I am extremely pleased with where things stand today. I think he is headed in the right direction. 06/11/2021 upon evaluation today patient appears to be doing well with regard to his left ankle ulcer and poorly in regard to the toe ulcer on the second toe right foot. This appears to show signs of joint exposure. Apparently this has been present for 1 to 2 months although he kept [employer] [place] ([id number]) forgetting to tell me about it. That is unfortunate as right now it definitely appears to be doing significantly worse than what I would like to see. There does not appear to be any signs of active infection systemically though locally I am concerned about the possibility of infection the toe is quite red. Again no one from the facility  ever contacted [place] to advise that this was going on in the interim either. 06/17/2021 upon evaluation today patient presents for follow-up I did review his x-ray which showed a navicular bone fracture I am unsure of the chronicity of this. Subsequently he also had osteomyelitis of the toe which was what I was more concerned about this did not show up on x-ray but did show up on the pathology scrapings. This was listed as acute osteomyelitis. Nonetheless at this point I think that the antibiotic treatment is the best regimen to go with currently. The patient is in agreement with that plan. Nonetheless he has initially 30 days of doxycycline off likely extend that towards the end of the treatment cycle that will be around the middle of December for an additional 2 weeks. That all depends on how well he continues to heal. Nonetheless based on what I am seeing in the foot I did want a proceed with an MRI as well which I think will be helpful to identify if there is anything else that needs to be addressed from the standpoint of infection. 06/24/2021 upon evaluation today patient appears to be doing pretty well in regards to his wounds. I think both are actually showing signs of improvement which is good I did review his MRI today which did show signs of osteomyelitis of the middle and proximal phalanx on his right foot of the affected toe. With that being said this is actually showing signs of significant improvement today already with the antibiotic therapy I think the redness is also improved. Overall I think that we just need to give this some time with appropriate wound care we will see how things go potentially hyperbarics could be considered. 07/02/2021 upon inspection today patient actually appears to be doing well in regard to his left ankle which is getting very close to complete resolution of pleased in that regard. Unfortunately he is continuing to have issues with his second toe right foot and  this seems to still be very painful for him. Recommend he try something different from the standpoint of antibiotics. 07/15/2021 upon evaluation today patient appears to be doing actually pretty well in regard to his foot. This is actually showing signs of significant improvement which is great news. Overall I feel like the patient is  improving both in regard to the second toe as well as the ankle on the left. With that being said the biggest issue that I do see currently is that he is needing to have a refill of the doxycycline that we previously treated him with. He also did see podiatry they are not going to recommend any amputation at this point since he seems to be doing quite well. For that reason we just need to keep things under control from an infection standpoint. 08/01/2021 upon evaluation today patient appears to be doing well with regard to his wound. He has been tolerating the dressing changes without complication. Fortunately there does not appear to be any evidence of active infection locally nor systemically at this point. In fact I think everything is doing excellent in fact his second toe on the right foot is almost healed and the ankle on the left ankle region is actually very close to being healed as well. 08/08/2021 upon evaluation today patient appears to be doing well with regard to his wound. He has been tolerating the dressing changes without complication. Fortunately I do not see any signs of active infection at this time. Readmission: 12-06-2021 upon evaluation today patient presents for reevaluation here in the clinic he does tell me that he was being seen in facility at [facility] by a provider that was coming in. He is not sure who this was. He tells me however that the wound seems to have gotten worse even compared to where it was when we last saw him at this point. With that being said I do believe that he is likely going need ongoing wound care here in the  clinic and I do believe that we need to be the ones to frontline this since his wound does seem to be getting worse not better at this point. He voiced understanding. He is also in agreement with this plan and feels more comfortable coming here she tells me. Patient's medical history really has not changed since his prior admission he was only gone since January. 12-27-2021 upon evaluation today patient appears to be doing well with regard to his wound they did run out of the [facility] so they did not put anything on just an ABD pad with gentamicin. Still we are seeing some signs of good improvement here with some new epithelization which is great news. 01-10-2022 upon evaluation today patient appears to be doing well with regard to his wounds and he is going require some sharp debridement but overall seems to be making good progress. Fortunately I do not see any evidence of active infection locally or systemically at this time which is great news. 01-24-2022 upon evaluation today patient appears to be doing well with regard to his wound. The facility actually came and dropped him off early and he had another appointment at the hospital and then they just brought him over here and this was still hours before his appointment this afternoon. For that reason we did do our best to work him in this morning and fortunately had some space to make this happen. With that being said patient's wound does seem to be making progress here and I am very pleased in that regard I do not see any signs of active infection locally or systemically at this time. 02-07-2022 patient appears to be doing well currently in regard to his wounds. In fact one of them the more proximal is healed the distal is still open but seems to be  doing excellent. Fortunately I do not see any evidence of active infection locally or systemically at this time which is great news. No fevers, chills, nausea, vomiting, or diarrhea. 7/27; left  medial ankle venous. Improving per our intake nurse. We are using Hydrofera Blue under Tubigrip compression. They are changing that at his facility Objective Constitutional Chad [name], Chad [name] ([id number]) Sitting or standing Blood Pressure is within target range for patient.. Pulse regular and within target range for patient.Marland Kitchen Respirations regular, non- labored and within target range.. Temperature is normal and within the target range for the patient.Marland Kitchen appears in no distress. Vitals Time Taken: 9:40 AM, Height: 67 in, Weight: 150 lbs, BMI: 23.5, Temperature: 98.3 F, Pulse: 93 bpm, Respiratory Rate: 18 breaths/min, Blood Pressure: 114/60 mmHg. General Notes: Wound exam; under illumination the wound bed looks reasonable. No debridement no evidence of surrounding infection. Dorsalis pedis pulses palpable edema control seems adequate Integumentary (Hair, Skin) Wound #12 status is Open. Original cause of wound was Gradually Appeared. The date acquired was: 07/12/2019. The wound has been in treatment 10 weeks. The wound is located on the Left,Distal,Medial Ankle. The wound measures 2.5cm length x 1.2cm width x 0.2cm depth; 2.356cm^2 area and 0.471cm^3 volume. There is Fat Layer (Subcutaneous Tissue) exposed. There is no tunneling or undermining noted. There is a small amount of serosanguineous drainage noted. There is medium (34-66%) pink granulation within the wound bed. There is a medium (34- 66%) amount of necrotic tissue within the wound bed including Adherent Slough. 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 Bathing/ Shower/ Hygiene: Wash wounds with antibacterial soap and water. No tub  bath. WOUND #12: - Ankle Wound Laterality: Left, Medial, Distal Cleanser: Soap and Water 3 x Per Week/30 Days Discharge Instructions: Gently cleanse wound with antibacterial soap, rinse and pat dry prior to dressing wounds Cleanser: Wound Cleanser 3 x Per Week/30 Days Discharge Instructions: Wash your hands with soap and water. Remove old dressing, discard into plastic bag and place into trash. Cleanse the wound with Wound Cleanser prior to applying a clean dressing using gauze sponges, not tissues or cotton balls. Do not scrub or use excessive force. Pat dry using gauze sponges, not tissue or cotton balls. Topical: Gentamicin 3 x Per Week/30 Days Discharge Instructions: Apply as directed by provider. Primary Dressing: Hydrofera Blue Ready Transfer Foam, 2.5x2.5 (in/in) 3 x Per Week/30 Days Discharge Instructions: Apply Hydrofera Blue Ready to wound bed as directed Secondary Dressing: ABD Pad 5x9 (in/in) 3 x Per Week/30 Days Discharge Instructions: Cover with ABD pad Secured With: Medipore Tape - 33M Medipore H Soft Cloth Surgical Tape, 2x2 (in/yd) 3 x Per Week/30 Days Secured With: [employer] Sterile or Non-Sterile 6-ply 4.5x4 (yd/yd) 3 x Per Week/30 Days Discharge Instructions: Apply Kerlix as directed Secured With: Tubigrip Size C, 2.75x10 (in/yd) 3 x Per Week/30 Days Discharge Instructions: Apply 3 Tubigrip C 3-finger-widths below knee to base of toes to secure dressing and/or for swelling. This can be hand washed and air dried 1. No change in primary dressing which is Hydrofera Blue/ABD/kerlix/Tubigrip Electronic Signature(s) Signed: 02/20/2022 4:27:18 PM By: Linton Ham MD Entered By: Linton Ham on 02/20/2022 09:51:25 Chad [name] ([id number]) -------------------------------------------------------------------------------- SuperBill Details Patient Name: Chad [name] Date of Service: 02/20/2022 Medical Record Number: [id number] Patient Account Number: [id number] Date  of Birth/Sex: 10-02-1960 (61 y.o. M) Treating RN: Chad [name] Primary Care Provider: Erik [name] Other Clinician: Massie [name] Referring Provider: Erik [name] Treating Provider/Extender: Chad [name] in Treatment: 10 Diagnosis Coding ICD-10 Codes Code Description [phone] Chronic venous hypertension (idiopathic) with ulcer and inflammation of left lower extremity L97.322 Non-pressure chronic ulcer of left ankle with fat layer exposed 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 side Facility Procedures CPT4 Code: [id number] Description: [phone] - WOUND CARE VISIT-LEV 2 EST PT Modifier: Quantity: 1 Physician Procedures CPT4 Code Description: [id number] - WC PHYS LEVEL 3 - EST PT Modifier: Quantity: 1 CPT4 Code Description: ICD-10 Diagnosis Description 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 (idiopathic) with ulcer and inflammation of L97.322 Non-pressure chronic ulcer of left ankle with fat layer exposed I69.354 Hemiplegia and hemiparesis following  cerebral infarction affecting left Modifier: left lower extremity non-dominant side Quantity: Electronic Signature(s) Signed: 02/20/2022 9:53:05 AM By: Carlene Coria RN Signed: 02/20/2022 4:27:18 PM By: Linton Ham MD Entered By: Carlene Coria on 02/20/2022 09:53:05

## 2022-02-20 NOTE — Progress Notes (Signed)
Chad [name], Chad [name] ([id number]) Visit Report for 02/20/2022 Arrival Information Details Patient Name: Chad [name], Chad [name] Date of Service: 02/20/2022 9:45 AM Medical Record Number: [id number] Patient Account Number: [id number] Date of Birth/Sex: Mar 30, 1961 (61 y.o. M) Treating RN: Carlene Coria Primary Care Niels Cranshaw: [name] Other Clinician: [name] Referring Wakeelah Solan: [name] Treating Nazire Fruth/Extender: Tito Dine in Treatment: 10 Visit Information History Since Last Visit All ordered tests and consults were completed: No Patient Arrived: Wheel Chair Added or deleted any medications: No Arrival Time: 09:30 Any new allergies or adverse reactions: No Accompanied By: self Had a fall or experienced change in No Transfer Assistance: None activities of daily living that may affect Patient Identification Verified: Yes risk of falls: Secondary Verification Process Completed: Yes Signs or symptoms of abuse/neglect since last visito No Patient Requires Transmission-Based No Hospitalized since last visit: No Precautions: Implantable device outside of the clinic excluding No Patient Has Alerts: Yes cellular tissue based products placed in the center Patient Alerts: Patient on Blood Thinner since last visit: NOT diabetic Has Dressing in Place as Prescribed: Yes aspirin 37m Has Compression in Place as Prescribed: [facility] Now: No Electronic Signature(s) Signed: 02/20/2022 4:52:51 PM By: ECarlene CoriaRN Entered By: ECarlene Coriaon 02/20/2022 0[name] -------------------------------------------------------------------------------- Clinic Level of Care Assessment Details Patient Name: LLawerance SabalDate of Service: 02/20/2022 9:45 AM Medical Record Number: [id number] Account Number: [id number] of Birth/Sex: [birth year](61 y.o. M) Treating RN: ECarlene CoriaPrimary Care Nataline Basara: LErik ObeyOther Clinician: VMassie KluverReferring Callen Zuba: LErik ObeyTreating Daziah Hesler/Extender: RTito Dinein Treatment: 10 Clinic Level of Care Assessment Items TOOL 4 Quantity Score _0  - Use when only an EandM is performed on FOLLOW-UP visit 0 ASSESSMENTS - Nursing Assessment / Reassessment X - Reassessment of Co-morbidities (includes updates in patient status) 1 10 X- 1 5 Reassessment of Adherence to Treatment Plan ASSESSMENTS - Wound and Skin Assessment / Reassessment X - Simple Wound Assessment / Reassessment - one wound 1 5 _1  - 0 Complex Wound Assessment / Reassessment - multiple wounds _2  - 0 Dermatologic / Skin Assessment (not related to wound area) ASSESSMENTS - Focused Assessment _3  - Circumferential Edema Measurements - multi extremities 0 _4  - 0 Nutritional Assessment / Counseling / Intervention _5  - 0 Lower Extremity Assessment (monofilament, tuning fork, pulses) _6  - 0 Peripheral Arterial Disease Assessment (using hand held doppler) ASSESSMENTS - Ostomy and/or Continence Assessment and Care _7  - Incontinence Assessment and Management 0 _8  - 0 Ostomy Care Assessment and Management (repouching, etc.) PROCESS - Coordination of Care X - Simple Patient / Family Education for ongoing care 1 15 _9  - 0 Complex (extensive) Patient / Family Education for ongoing care _10  - 0 Staff obtains CProgrammer, systems Records, Test Results / Process Orders _11  - 0 Staff telephones HHA, Nursing Homes / Clarify orders / etc _12  - 0 Routine Transfer to another Facility (non-emergent condition) _13  - 0 Routine Hospital Admission (non-emergent condition) _14  - 0 New Admissions / IBiomedical engineer/ Ordering NPWT, Apligraf, etc. _15  - 0 Emergency Hospital Admission (emergent condition) X- 1 10 Simple Discharge Coordination _16  - 0 Complex (extensive) Discharge Coordination PROCESS - Special Needs _17  - Pediatric / Minor Patient Management 0 _18  - 0 Isolation Patient  Management _19  - 0 Hearing / Language / Visual special needs _20  - 0 Assessment of Community assistance (transportation, D/C planning, etc.) _21  - 0 Additional assistance / Altered mentation _22  - 0 Support  Surface(s) Assessment (bed, cushion, seat, etc.) INTERVENTIONS - Wound Cleansing / Measurement Chad [name], Chad [name] ([id number]) X- 1 5 Simple Wound Cleansing - one wound _0  - 0 Complex Wound Cleansing - multiple wounds X- 1 5 Wound Imaging (photographs - any number of wounds) _1  - 0 Wound Tracing (instead of photographs) X- 1 5 Simple Wound Measurement - one wound _2  - 0 Complex Wound Measurement - multiple wounds INTERVENTIONS - Wound Dressings X - Small Wound Dressing one or multiple wounds 1 10 _3  - 0 Medium Wound Dressing one or multiple wounds _4  - 0 Large Wound Dressing one or multiple wounds <IHKVQQVZDGLOVFIE>_3<\/PIRJJOACZYSAYTKZ>_6  - 0 Application of Medications - topical <WFUXNATFTDDUKGUR>_4<\/YHCWCBJSEGBTDVVO>_1  - 0 Application of Medications - injection INTERVENTIONS - Miscellaneous _7  - External ear exam 0 _8  - 0 Specimen Collection (cultures, biopsies, blood, body fluids, etc.) _9  - 0 Specimen(s) / Culture(s) sent or taken to Lab for analysis _10  - 0 Patient Transfer (multiple staff / Civil Service fast streamer / Similar devices) _11  - 0 Simple Staple / Suture removal (25 or less) _12  - 0 Complex Staple / Suture removal (26 or more) _13  - 0 Hypo / Hyperglycemic Management (close monitor of Blood Glucose) _14  - 0 Ankle / Brachial Index (ABI) - do not check if billed separately X- 1 5 Vital Signs Has the patient been seen at the hospital within the last three years: Yes Total Score: 75 Level Of Care: New/Established - Level 2 Electronic Signature(s) Signed: 02/20/2022 4:52:51 PM By: Carlene Coria RN Entered By: Carlene Coria on 02/20/2022 09:52:53 Chad [name] ([id number]) -------------------------------------------------------------------------------- Encounter Discharge Information Details Patient Name: Chad [name] Date of Service:  02/20/2022 9:45 AM Medical Record Number: [id number] Patient Account Number: [id number] Date of Birth/Sex: 05/27/1961 (61 y.o. M) Treating RN: Carlene Coria Primary Care Nakira Litzau: [name] Other Clinician: [name] Referring Ivelise Castillo: [name] Treating Merlin Golden/Extender: Tito Dine in Treatment: 10 Encounter Discharge Information Items Discharge Condition: Stable Ambulatory Status: Walker Discharge Destination: Home Transportation: Private Auto Accompanied By: self Schedule Follow-up Appointment: Yes Clinical Summary of Care: Electronic Signature(s) Signed: 02/20/2022 9:54:06 AM By: Carlene Coria RN Entered By: Carlene Coria on 02/20/2022 09:54:05 Chad [name] ([id number]) -------------------------------------------------------------------------------- Lower Extremity Assessment Details Patient Name: Chad [name] Date of Service: 02/20/2022 9:45 AM Medical Record Number: [id number] Patient Account Number: [id number] Date of Birth/Sex: [birth year] (61 y.o. M) Treating RN: Carlene Coria Primary Care Fortune Brannigan: [name] Other Clinician: [name] Referring Miri Jose: [name] Treating Sahithi Ordoyne/Extender: Tito Dine in Treatment: 10 Edema Assessment Assessed: [Left: No] [Right: No] Edema: [Left: Ye] [Right: s] Calf Left: Right: Point of Measurement: 31 cm From Medial Instep 32 cm Ankle Left: Right: Point of Measurement: 11 cm From Medial Instep 20.2 cm Vascular Assessment Pulses: Dorsalis Pedis Palpable: [Left:Yes] Electronic Signature(s) Signed: 02/20/2022 4:52:51 PM By: Carlene Coria RN Entered By: Carlene Coria on 02/20/2022 09:41:57 Chad [name] ([id number]) -------------------------------------------------------------------------------- Multi Wound Chart Details Patient Name: Chad [name] Date of Service: 02/20/2022 9:45 AM Medical Record Number: [id number] Patient Account Number: [id number] Date of Birth/Sex:  04-16-1961 (61 y.o. M) Treating RN: Carlene Coria Primary Care Timoty Bourke: [name] Other Clinician: [name] Referring Lariyah Shetterly: [name] Treating Legend Pecore/Extender: Tito Dine in Treatment: 10 Vital Signs Height(in): 70 Pulse(bpm): 52 Weight(lbs): 150 Blood Pressure(mmHg): 114/60 Body Mass Index(BMI): 23.5 Temperature(F): 98.3 Respiratory Rate(breaths/min): 18 Photos: [12:No Photos] [N/A:N/A] Wound Location: [12:Left, Distal, Medial Ankle] [N/A:N/A] Wounding Event: [12:Gradually Appeared] [N/A:N/A] Primary Etiology: [12:Venous Leg Ulcer] [N/A:N/A] Comorbid History: [12:Anemia, Chronic Obstructive Pulmonary Disease (COPD), Coronary Artery Disease,  Peripheral Arterial Disease, Peripheral Venous Disease, Hepatitis C, Osteoarthritis, Neuropathy, Received Chemotherapy] [N/A:N/A] Date Acquired: [12:07/12/2019] [N/A:N/A] Weeks of Treatment: [12:10] [N/A:N/A] Wound Status: [12:Open] [N/A:N/A] Wound Recurrence: [12:No] [N/A:N/A] Measurements L x W x D (cm) [12:2.5x1.2x0.2] [N/A:N/A] Area (cm) : [12:2.356] [N/A:N/A] Volume (cm) : [12:0.471] [N/A:N/A] % Reduction in Area: [12:65.90%] [N/A:N/A] % Reduction in Volume: [12:65.90%] [N/A:N/A] Classification: [12:Full Thickness Without Exposed Support Structures] [N/A:N/A] Exudate Amount: [12:Small] [N/A:N/A] Exudate Type: [12:Serosanguineous] [N/A:N/A] Exudate Color: [12:red, brown] [N/A:N/A] Granulation Amount: [12:Medium (34-66%)] [N/A:N/A] Granulation Quality: [12:Pink] [N/A:N/A] Necrotic Amount: [12:Medium (34-66%)] [N/A:N/A] Exposed Structures: [12:Fat Layer (Subcutaneous Tissue): Yes Fascia: No Tendon: No Muscle: No Joint: No Bone: No Small (1-33%)] [N/A:N/A N/A] Treatment Notes Electronic Signature(s) Signed: 02/20/2022 4:52:51 PM By: Carlene Coria RN Entered By: Carlene Coria on 02/20/2022 09:43:13 Chad [name]  ([id number]) -------------------------------------------------------------------------------- Multi-Disciplinary Care Plan Details Patient Name: Chad [name] Date of Service: 02/20/2022 9:45 AM Medical Record Number: [id number] Patient Account Number: [id number] Date of Birth/Sex: 1961/07/10 (61 y.o. M) Treating RN: Carlene Coria Primary Care Rafiq Bucklin: [name] Other Clinician: [name] Referring Culley Hedeen: [name] Treating Ronika Kelson/Extender: Tito Dine in Treatment: 10 Active Inactive Necrotic Tissue Nursing Diagnoses: Impaired tissue integrity related to necrotic/devitalized tissue Knowledge deficit related to management of necrotic/devitalized tissue Goals: Necrotic/devitalized tissue will be minimized in the wound bed Date Initiated: 02/07/2022 Target Resolution Date: 02/07/2022 Goal Status: Active Patient/caregiver will verbalize understanding of reason and process for debridement of necrotic tissue Date Initiated: 02/07/2022 Target Resolution Date: 02/07/2022 Goal Status: Active Interventions: Assess patient pain level pre-, during and post procedure and prior to discharge Provide education on necrotic tissue and debridement process Treatment Activities: Excisional debridement : 02/07/2022 Notes: Venous Leg Ulcer Nursing Diagnoses: Knowledge deficit related to disease process and management Goals: Patient will maintain optimal edema control Date Initiated: 12/06/2021 Date Inactivated: 12/27/2021 Target Resolution Date: 01/03/2022 Goal Status: Met Patient/caregiver will verbalize understanding of disease process and disease management Date Initiated: 12/06/2021 Target Resolution Date: 01/03/2022 Goal Status: Active Interventions: Assess peripheral edema status every visit. Compression as ordered Notes: Electronic Signature(s) Signed: 02/20/2022 4:52:51 PM By: Carlene Coria RN Entered By: Carlene Coria on 02/20/2022 [place], [place]  ([id number]) -------------------------------------------------------------------------------- Pain Assessment Details Patient Name: Chad [name] Date of Service: 02/20/2022 9:45 AM Medical Record Number: [id number] Patient Account Number: [id number] Date of Birth/Sex: [birth year] (61 y.o. M) Treating RN: Carlene Coria Primary Care Calvina Liptak: [name] Other Clinician: [name] Referring Artesha Wemhoff: [name] Treating Jaileigh Weimer/Extender: Tito Dine in Treatment: 10 Active Problems Location of Pain Severity and Description of Pain Patient Has Paino No Site Locations Pain Management and Medication Current Pain Management: Electronic Signature(s) Signed: 02/20/2022 4:52:51 PM By: Carlene Coria RN Entered By: Carlene Coria on 02/20/2022 09:40:30 Chad [name] ([id number]) -------------------------------------------------------------------------------- Patient/Caregiver Education Details Patient Name: Chad [name] Date of Service: 02/20/2022 9:45 AM Medical Record Number: [id number] Patient Account Number: [id number] Date of Birth/Gender: [birth year] (61 y.o. M) Treating RN: Carlene Coria Primary Care Physician: [name] Other Clinician: [name] Referring Physician: [name] Treating Physician/Extender: Tito Dine in Treatment: 10 Education Assessment Education Provided To: Patient Education Topics Provided Wound Debridement: Methods: Explain/Verbal Responses: State content correctly Electronic Signature(s) Signed: 02/20/2022 4:52:51 PM By: Carlene Coria RN Entered By: Carlene Coria on 02/20/2022 09:53:23 Chad [name] ([id number]) -------------------------------------------------------------------------------- Wound Assessment Details Patient Name: Chad [name] Date of Service: 02/20/2022 9:45 AM Medical Record Number: [id number] Patient Account Number: [id number] Date of Birth/Sex: Jul 30, 1960 (61 y.o. M) Treating RN: Carlene Coria Primary Care  Presli Fanguy: [name] Other Clinician: [name] Referring Elianys Conry: [name] Treating Wayland Baik/Extender: Tito Dine in Treatment: 10 Wound Status Wound Number: 12 Primary Venous Leg Ulcer Etiology: Wound Location: Left, Distal, Medial Ankle Wound Open Wounding Event: Gradually Appeared Status: Date Acquired: 07/12/2019 Comorbid Anemia, Chronic Obstructive Pulmonary Disease (COPD), Weeks Of Treatment: 10 History: Coronary Artery Disease, Peripheral Arterial Disease, Clustered Wound: No Peripheral Venous Disease, Hepatitis C, Osteoarthritis, Neuropathy, Received Chemotherapy Wound Measurements Length: (cm) 2.5 Width: (cm) 1.2 Depth: (cm) 0.2 Area: (cm) 2.356 Volume: (cm) 0.471 % Reduction in Area: 65.9% % Reduction in Volume: 65.9% Epithelialization: Small (1-33%) Tunneling: No Undermining: No Wound Description Classification: Full Thickness Without Exposed Support Structures Exudate Amount: Small Exudate Type: Serosanguineous Exudate Color: red, brown Foul Odor After Cleansing: No Slough/Fibrino Yes Wound Bed Granulation Amount: Medium (34-66%) Exposed Structure Granulation Quality: Pink Fascia Exposed: No Necrotic Amount: Medium (34-66%) Fat Layer (Subcutaneous Tissue) Exposed: Yes Necrotic Quality: Adherent Slough Tendon Exposed: No Muscle Exposed: No Joint Exposed: No Bone Exposed: No Treatment Notes Wound #12 (Ankle) Wound Laterality: Left, Medial, Distal Cleanser Soap and Water Discharge Instruction: Gently cleanse wound with antibacterial soap, rinse and pat dry prior to dressing wounds Wound Cleanser Discharge Instruction: Wash your hands with soap and water. Remove old dressing, discard into plastic bag and place into trash. Cleanse the wound with Wound Cleanser prior to applying a clean dressing using gauze sponges, not tissues or cotton balls. Do not scrub or use excessive force. Pat dry using gauze  sponges, not tissue or cotton balls. Peri-Wound Care Topical Gentamicin Discharge Instruction: Apply as directed by Hannahmarie Asberry. Primary Dressing Hydrofera Blue Ready Transfer Foam, 2.5x2.5 (in/in) Discharge Instruction: Apply Hydrofera Blue Ready to wound bed as directed Secondary Dressing Chad [name], Chad [name] ([id number]) ABD Pad 5x9 (in/in) Discharge Instruction: Cover with ABD pad Secured With Medipore Tape - 70M Medipore H Soft Cloth Surgical Tape, 2x2 (in/yd) Kerlix Roll Sterile or Non-Sterile 6-ply 4.5x4 (yd/yd) Discharge Instruction: Apply Kerlix as directed Tubigrip Size C, 2.75x10 (in/yd) Discharge Instruction: Apply 3 Tubigrip C 3-finger-widths below knee to base of toes to secure dressing and/or for swelling. This can be hand washed and air dried Compression Wrap Compression Stockings Add-Ons Electronic Signature(s) Signed: 02/20/2022 4:52:51 PM By: Carlene Coria RN Entered By: Carlene Coria on 02/20/2022 09:41:16 Chad [name] ([id number]) -------------------------------------------------------------------------------- Vitals Details Patient Name: Chad [name] Date of Service: 02/20/2022 9:45 AM Medical Record Number: [id number] Patient Account Number: [id number] Date of Birth/Sex: 1960/08/29 (61 y.o. M) Treating RN: Carlene Coria Primary Care Trevia Nop: [name] Other Clinician: [name] Referring Kohana Amble: [name] Treating Landy Dunnavant/Extender: Tito Dine in Treatment: 10 Vital Signs Time Taken: 09:40 Temperature (F): 98.3 Height (in): 67 Pulse (bpm): 93 Weight (lbs): 150 Respiratory Rate (breaths/min): 18 Body Mass Index (BMI): 23.5 Blood Pressure (mmHg): 114/60 Reference Range: 80 - 120 mg / dl Electronic Signature(s) Signed: 02/20/2022 4:52:51 PM By: Carlene Coria RN Entered By: Carlene Coria on 02/20/2022 09:40:20

## 2022-02-21 MED FILL — Iron Sucrose Inj 20 MG/ML (Fe Equiv): INTRAVENOUS | Qty: 10 | Status: AC

## 2022-02-24 ENCOUNTER — Inpatient Hospital Stay: Payer: Medicaid Other

## 2022-02-24 ENCOUNTER — Inpatient Hospital Stay (HOSPITAL_BASED_OUTPATIENT_CLINIC_OR_DEPARTMENT_OTHER): Payer: Medicaid Other | Admitting: Nurse Practitioner

## 2022-02-24 ENCOUNTER — Encounter: Payer: Self-pay | Admitting: Nurse Practitioner

## 2022-02-24 VITALS — BP 111/76 | HR 76 | Temp 98.7°F | Resp 20 | Wt 154.2 lb

## 2022-02-24 DIAGNOSIS — C661 Malignant neoplasm of right ureter: Secondary | ICD-10-CM | POA: Diagnosis not present

## 2022-02-24 DIAGNOSIS — Z5112 Encounter for antineoplastic immunotherapy: Secondary | ICD-10-CM | POA: Diagnosis not present

## 2022-02-24 DIAGNOSIS — D500 Iron deficiency anemia secondary to blood loss (chronic): Secondary | ICD-10-CM | POA: Diagnosis not present

## 2022-02-24 DIAGNOSIS — E039 Hypothyroidism, unspecified: Secondary | ICD-10-CM

## 2022-02-24 DIAGNOSIS — C187 Malignant neoplasm of sigmoid colon: Secondary | ICD-10-CM

## 2022-02-24 DIAGNOSIS — Z515 Encounter for palliative care: Secondary | ICD-10-CM

## 2022-02-24 DIAGNOSIS — Z7189 Other specified counseling: Secondary | ICD-10-CM

## 2022-02-24 LAB — CBC WITH DIFFERENTIAL/PLATELET
Abs Immature Granulocytes: 0.02 10*3/uL (ref 0.00–0.07)
Basophils Absolute: 0.1 10*3/uL (ref 0.0–0.1)
Basophils Relative: 1 %
Eosinophils Absolute: 0.3 10*3/uL (ref 0.0–0.5)
Eosinophils Relative: 5 %
HCT: 33.8 % — ABNORMAL LOW (ref 39.0–52.0)
Hemoglobin: 10.8 g/dL — ABNORMAL LOW (ref 13.0–17.0)
Immature Granulocytes: 0 %
Lymphocytes Relative: 19 %
Lymphs Abs: 1.1 10*3/uL (ref 0.7–4.0)
MCH: 26 pg (ref 26.0–34.0)
MCHC: 32 g/dL (ref 30.0–36.0)
MCV: 81.3 fL (ref 80.0–100.0)
Monocytes Absolute: 0.5 10*3/uL (ref 0.1–1.0)
Monocytes Relative: 9 %
Neutro Abs: 3.7 10*3/uL (ref 1.7–7.7)
Neutrophils Relative %: 66 %
Platelets: 182 10*3/uL (ref 150–400)
RBC: 4.16 MIL/uL — ABNORMAL LOW (ref 4.22–5.81)
RDW: 13.8 % (ref 11.5–15.5)
WBC: 5.6 10*3/uL (ref 4.0–10.5)
nRBC: 0 % (ref 0.0–0.2)

## 2022-02-24 LAB — COMPREHENSIVE METABOLIC PANEL
ALT: 23 U/L (ref 0–44)
AST: 39 U/L (ref 15–41)
Albumin: 3.3 g/dL — ABNORMAL LOW (ref 3.5–5.0)
Alkaline Phosphatase: 117 U/L (ref 38–126)
Anion gap: 5 (ref 5–15)
BUN: 21 mg/dL (ref 8–23)
CO2: 26 mmol/L (ref 22–32)
Calcium: 8.3 mg/dL — ABNORMAL LOW (ref 8.9–10.3)
Chloride: 106 mmol/L (ref 98–111)
Creatinine, Ser: 1 mg/dL (ref 0.61–1.24)
GFR, Estimated: >60 mL/min (ref >60)
Glucose, Bld: 100 mg/dL — ABNORMAL HIGH (ref 70–99)
Potassium: 4.1 mmol/L (ref 3.5–5.1)
Sodium: 137 mmol/L (ref 135–145)
Total Bilirubin: 0.3 mg/dL (ref 0.3–1.2)
Total Protein: 7.1 g/dL (ref 6.5–8.1)

## 2022-02-24 LAB — IRON AND TIBC
Iron: 16 ug/dL — ABNORMAL LOW (ref 45–182)
Saturation Ratios: 4 % — ABNORMAL LOW (ref 17.9–39.5)
TIBC: 427 ug/dL (ref 250–450)
UIBC: 411 ug/dL

## 2022-02-24 LAB — FERRITIN: Ferritin: 13 ng/mL — ABNORMAL LOW (ref 24–336)

## 2022-02-24 MED ORDER — SODIUM CHLORIDE 0.9 % IV SOLN
200.0000 mg | Freq: Once | INTRAVENOUS | Status: AC
  Administered 2022-02-24: 200 mg via INTRAVENOUS
  Filled 2022-02-24: qty 200

## 2022-02-24 MED ORDER — HEPARIN SOD (PORK) LOCK FLUSH 100 UNIT/ML IV SOLN
500.0000 [IU] | Freq: Once | INTRAVENOUS | Status: AC | PRN
  Administered 2022-02-24: 500 [IU]
  Filled 2022-02-24: qty 5

## 2022-02-24 MED ORDER — SODIUM CHLORIDE 0.9 % IV SOLN
Freq: Once | INTRAVENOUS | Status: AC
  Filled 2022-02-24: qty 250

## 2022-02-24 MED ORDER — LEVOTHYROXINE SODIUM 25 MCG PO TABS: 25.0000 ug | ORAL_TABLET | Freq: Every day | ORAL | 1 refills | Status: DC

## 2022-02-24 MED ORDER — SODIUM CHLORIDE 0.9 % IV SOLN
200.0000 mg | Freq: Once | INTRAVENOUS | Status: AC
  Administered 2022-02-24: 200 mg via INTRAVENOUS
  Filled 2022-02-24: qty 8

## 2022-02-24 NOTE — Progress Notes (Signed)
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 Is alone/in a wheelchair.  Chad [name] 61 y.o. male above history of stage IV-ureteral cancer/history of stage III colon cancer right side; recurrent sigmoid colon cancer [un-resected/under surveillance] and multiple other comorbidities currently on Keytruda is here for follow-up and consideration of keytruda and iron.   Denies any worsening abdominal pain.  Any nausea vomiting or constipation.  No blood in stools or black-colored stools.  No hematuria.  Chronic shortness of breath. Denies other complaints.    Review of Systems   Constitutional:  Positive for malaise/fatigue. Negative for chills, diaphoresis, fever and weight loss.  HENT:  Negative for nosebleeds and sore throat.   Eyes:  Negative for double vision.  Respiratory:  Positive for cough and shortness of breath. Negative for hemoptysis and wheezing.   Cardiovascular:  Negative for chest pain, palpitations and orthopnea.  Gastrointestinal:  Positive for constipation. Negative for abdominal pain, blood in stool, diarrhea, heartburn, melena, nausea and vomiting.  Genitourinary:  Negative for dysuria, frequency and urgency.  Musculoskeletal:  Positive for back pain and joint pain.  Skin: Negative.  Negative for itching and rash.  Neurological:  Positive for focal weakness. Negative for dizziness, tingling, weakness and headaches.       Chronic left-sided weakness upper than lower extremity.  Endo/Heme/Allergies:  Does not bruise/bleed easily.  Psychiatric/Behavioral:  Negative for depression. The patient is not nervous/anxious and does not have insomnia.      MEDICAL HISTORY:  Past Medical History:  Diagnosis Date   Anemia    Anxiety    ARF (acute respiratory failure) (HCC)    Bladder cancer (HCC)    COPD (chronic obstructive pulmonary disease) ([place])    Depression    Dysphagia    Family history of colon cancer    Family history of kidney cancer    Family history of leukemia    Family history of prostate cancer    GERD (gastroesophageal reflux disease)    Hepatitis    chronic hep c   Hydronephrosis    Hydronephrosis with ureteral stricture    Hyperlipidemia    Knee pain    Left   Malignant neoplasm of colon (HCC)    Nerve pain  Peripheral vascular disease ([place])    Prostate cancer ([place])    Stroke [facility])    Ureteral cancer, right ([place])    Urinary frequency    Venous hypertension of both lower extremities     SURGICAL HISTORY: Past Surgical History:  Procedure Laterality Date   COLON SURGERY     En bloc extended right hemicolectomy  07/2017   COLONOSCOPY WITH PROPOFOL N/A 11/06/2020   Procedure: COLONOSCOPY WITH PROPOFOL;  Surgeon: Jonathon Bellows, MD;  Location[facility] ENDOSCOPY;  Service: Gastroenterology;  Laterality: N/A;   COLONOSCOPY WITH PROPOFOL N/A 07/31/2021   Procedure: COLONOSCOPY WITH PROPOFOL;  Surgeon: Jerran Bellow, MD;  Location: [facility] ENDOSCOPY;  Service: Endoscopy;  Laterality: N/A;   CYSTOSCOPY W/ RETROGRADES Right 08/30/2018   Procedure: CYSTOSCOPY WITH RETROGRADE PYELOGRAM;  Surgeon: Hollice Espy, MD;  Location: [facility] ORS;  Service: Urology;  Laterality: Right;   CYSTOSCOPY WITH STENT PLACEMENT Right 04/25/2018   Procedure: CYSTOSCOPY WITH STENT PLACEMENT;  Surgeon: Hollice Espy, MD;  Location: [facility] ORS;  Service: Urology;  Laterality: Right;   CYSTOSCOPY WITH STENT PLACEMENT Right 08/30/2018   Procedure: [place];  Surgeon: Hollice Espy, MD;  Location: [facility] ORS;  Service: Urology;  Laterality: Right;   CYSTOSCOPY WITH STENT PLACEMENT Right 03/07/2019   Procedure: CYSTOSCOPY WITH STENT Exchange;  Surgeon: Hollice Espy, MD;  Location: [facility] ORS;  Service: Urology;  Laterality: Right;   CYSTOSCOPY WITH STENT PLACEMENT Right 11/21/2019   Procedure: CYSTOSCOPY WITH STENT Exchange;  Surgeon: Hollice Espy, MD;  Location: [facility] ORS;  Service: Urology;  Laterality: Right;   LOWER EXTREMITY ANGIOGRAPHY Left 05/23/2019   Procedure: LOWER EXTREMITY ANGIOGRAPHY;  Surgeon: Algernon Huxley, MD;  Location: [place] CV LAB;  Service: Cardiovascular;  Laterality: Left;   LOWER EXTREMITY ANGIOGRAPHY Right 05/30/2019   Procedure: LOWER EXTREMITY ANGIOGRAPHY;  Surgeon: Algernon Huxley, MD;  Location: [place] CV LAB;  Service: Cardiovascular;  Laterality: Right;   LOWER EXTREMITY ANGIOGRAPHY Right 02/13/2020   Procedure: LOWER EXTREMITY ANGIOGRAPHY;  Surgeon: Algernon Huxley, MD;  Location: [place] CV LAB;  Service: Cardiovascular;  Laterality: Right;   LOWER EXTREMITY ANGIOGRAPHY Left 02/20/2020    Procedure: LOWER EXTREMITY ANGIOGRAPHY;  Surgeon: Algernon Huxley, MD;  Location: [place] CV LAB;  Service: Cardiovascular;  Laterality: Left;   PORTA CATH INSERTION N/A 02/28/2019   Procedure: PORTA CATH INSERTION;  Surgeon: Algernon Huxley, MD;  Location: [place] CV LAB;  Service: Cardiovascular;  Laterality: N/A;   tumor removed       SOCIAL HISTORY: Social History   Socioeconomic History   Marital status: Single    Spouse name: Not on file   Number of children: Not on file   Years of education: Not on file   Highest education level: Not on file  Occupational History   Not on file  Tobacco Use   Smoking status: Every Day    Packs/day: 1.00    Types: Cigarettes   Smokeless tobacco: Never  Vaping Use   Vaping Use: Never used  Substance and Sexual Activity   Alcohol use: Not Currently   Drug use: Not Currently   Sexual activity: Not Currently  Other Topics Concern   Not on file  Social History Narrative    used to live [place]; moved  To [place]- end of April 2019; in Nursing home; 1pp/day; quit alcohol. 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        Family- dad- prostate ca [at 49y]; brother-  24 died of prostate cancer; brother- 47- no cancers [[place]]; sonGerald Stabs [[place]];[name]-32y prostate ca [facility] [place]]; daughter- 52 [[place]]; another daughter 28 [[place]/addict]. will refer genetics counseling. Given MSI- abnormal; highly suspicious of Lynch syndrome.  Patient's son [name] aware of high possible lynch syndrome.   Social Determinants of Health   Financial Resource Strain: Not on file  Food Insecurity: Not on file  Transportation Needs: Not on file  Physical Activity: Not on file  Stress: Not on file  Social Connections: Not on file  Intimate Partner Violence: Not on file    FAMILY HISTORY: Family History  Problem Relation Age of Onset   Prostate cancer Father 53   Cancer Brother 51       unsure type   Cancer Paternal Uncle        unsure type   Cancer Maternal Grandmother         unsure type   Cancer Paternal Grandmother        unsure type   Kidney cancer Paternal Grandfather    Cancer Other        unsure types   Leukemia Son    Cancer Son        other cancers, possibly colon    ALLERGIES:  is allergic to penicillins.  MEDICATIONS:  Current Outpatient Medications  Medication Sig Dispense Refill   acetaminophen (TYLENOL) 325 MG tablet Take 650 mg by mouth 3 (three) times daily.      aspirin EC 81 MG tablet Take 1 tablet (81 mg total) by mouth daily. 150 tablet 2   atorvastatin (LIPITOR) 10 MG tablet Take 1 tablet (10 mg total) by mouth daily. 30 tablet 11   carboxymethylcellulose 1 % ophthalmic solution Apply 1 drop to eye 2 (two) times daily.     clopidogrel (PLAVIX) 75 MG tablet Take 1 tablet (75 mg total) by mouth daily. 30 tablet 11   cyclobenzaprine (FLEXERIL) 10 MG tablet Take 10 mg by mouth at bedtime.     escitalopram (LEXAPRO) 5 MG tablet Take 5 mg by mouth daily.     gabapentin (NEURONTIN) 100 MG capsule Take 100 mg by mouth 3 (three) times daily.     gentamicin ointment (GARAMYCIN) 0.1 % Apply 1 application. topically 3 (three) times daily.     levothyroxine (SYNTHROID) 25 MCG tablet Take 1 tablet (25 mcg total) by mouth daily before breakfast. 30 tablet 1   mirtazapine (REMERON) 7.5 MG tablet Take 7.5 mg by mouth at bedtime.     Oxycodone HCl 10 MG TABS Take 10 mg by mouth every 6 (six) hours as needed for pain.     pantoprazole (PROTONIX) 40 MG tablet Take 40 mg by mouth daily.     senna-docusate (SENOKOT-S) 8.6-50 MG tablet Take 1 tablet by mouth daily.     tamsulosin (FLOMAX) 0.4 MG CAPS capsule Take 1 capsule (0.4 mg total) by mouth daily after supper. 30 capsule 3   No current facility-administered medications for this visit.   Marland Kitchen  PHYSICAL EXAMINATION: ECOG PERFORMANCE STATUS: 1 - Symptomatic but completely ambulatory  Vitals:   02/24/22 1302  BP: 111/76  Pulse: 76  Resp: 20  Temp: 98.7 F (37.1 C)  SpO2: 98%    Filed  Weights   02/24/22 1302  Weight: 154 lb 3.2 oz (69.9 kg)    Physical Exam Constitutional:      General: He is not in acute distress.    Comments: unaccompanied  HENT:     Head: Normocephalic  and atraumatic.     Mouth/Throat:     Pharynx: No oropharyngeal exudate.  Cardiovascular:     Rate and Rhythm: Normal rate and regular rhythm.  Pulmonary:     Effort: No respiratory distress.     Breath sounds: No wheezing.  Abdominal:     General: There is no distension.     Palpations: Abdomen is soft. There is no mass.     Tenderness: There is no abdominal tenderness. There is no guarding or rebound.  Musculoskeletal:        General: No tenderness or deformity.     Comments: wheelchair  Skin:    General: Skin is warm.     Findings: Lesion present.     Comments: Bilateral lower extremity ulcerations noted.  Pulses intact bilaterally.  Cystlike lesion noted right elbow-nontender.  No erythema noted.  Neurological:     Mental Status: He is alert and oriented to person, place, and time.     Comments: Chronic weakness left upper and lower extremity.  Psychiatric:        Mood and Affect: Mood and affect normal.        Behavior: Behavior normal.      LABORATORY DATA:  I have reviewed the data as listed Lab Results  Component Value Date   WBC 5.6 02/24/2022   HGB 10.8 (L) 02/24/2022   HCT 33.8 (L) 02/24/2022   MCV 81.3 02/24/2022   PLT 182 02/24/2022   Recent Labs    01/09/22 1004 02/03/22 0850 02/24/22 1231  NA 136 136 137  K 4.1 3.7 4.1  CL 103 104 106  CO2 _0 GLUCOSE 115* 124* 100*  BUN _1 CREATININE 0.98 1.02 1.00  CALCIUM 8.3* 8.4* 8.3*  GFRNONAA >60 >60 >60  PROT 6.7 7.1 7.1  ALBUMIN 3.0* 3.3* 3.3*  AST 31 76* 39  ALT 18 44 23  ALKPHOS 110 114 117  BILITOT 0.5 0.5 0.3   Lab Results  Component Value Date   TSH 4.683 (H) 02/03/2022     RADIOGRAPHIC STUDIES: I have personally reviewed the radiological images as listed and agreed with the  findings in the report. No results found.  ASSESSMENT & PLAN:   No problem-specific Assessment & Plan notes found for this encounter.  Ureteral cancer, right ([place]) # High-grade urothelial cancer/cytology; likely of the right renal pelvis /upper ureter. On keytruda-CT scan- JUNE 30th, 2023- Stable a thickening of the right ureteral area- no obvious progression of disease noted on imaging. STABLE.   # Proceed with [place] today; Labs today reviewed; Labs today reviewed;  acceptable for treatment today. STABLE. TSH April 2023--WNL.  TSH July 2023 elevated 4.683. Start levothyroxine 25 mcg daily.    # Sigmoid colon cancer-[under surveillance] [history of Lynch syndrome]- JAN 2023- S/p colonoscopy -poor prep/multiple failed attempts.  JUNE 2023 CT scan- Focal wall thickening in the proximal sigmoid colon; pericolonic lymph nodes overall stable.  Poor candidate for surgery; s/p evaluation with Dr. Bary Castilla. STABLE.    # Iron deficiency anemia-hemoglobin 11-12; FEB 2023- iron studies-7/ferritin-18 LOW; proceed with venofer. Reviewed risks and benefits of IV iron including allergic reaction. Patient agrees to proceed.    # Hypocalcemia- Mild- on Ca+vit D; JUNE 2023- 25-OH vit D levels: 30. STABLE.    # DISPOSITION: # Keytruda and iron today.  # follow up in 3 weeks-as scheduled  All questions were answered. The patient knows to call the clinic with any problems, questions or concerns.  Verlon Au, NP 02/24/2022 1:19 PM

## 2022-02-24 NOTE — Patient Instructions (Signed)
[facility]  Discharge Instructions: Thank you for choosing [facility] to provide your oncology and hematology care.  If you have a lab appointment with the Cancer Center, please go directly to the Cancer Center and check in at the registration area.  Wear comfortable clothing and clothing appropriate for easy access to any Portacath or PICC line.   We strive to give you quality time with your provider. You may need to reschedule your appointment if you arrive late (15 or more minutes).  Arriving late affects you and other patients whose appointments are after yours.  Also, if you miss three or more appointments without notifying the office, you may be dismissed from the clinic at the provider's discretion.      For prescription refill requests, have your pharmacy contact our office and allow 72 hours for refills to be completed.       To help prevent nausea and vomiting after your treatment, we encourage you to take your nausea medication as directed.  BELOW ARE SYMPTOMS THAT SHOULD BE REPORTED IMMEDIATELY: *FEVER GREATER THAN 100.4 F (38 C) OR HIGHER *CHILLS OR SWEATING *NAUSEA AND VOMITING THAT IS NOT CONTROLLED WITH YOUR NAUSEA MEDICATION *UNUSUAL SHORTNESS OF BREATH *UNUSUAL BRUISING OR BLEEDING *URINARY PROBLEMS (pain or burning when urinating, or frequent urination) *BOWEL PROBLEMS (unusual diarrhea, constipation, pain near the anus) TENDERNESS IN MOUTH AND THROAT WITH OR WITHOUT PRESENCE OF ULCERS (sore throat, sores in mouth, or a toothache) UNUSUAL RASH, SWELLING OR PAIN  UNUSUAL VAGINAL DISCHARGE OR ITCHING   Items with * indicate a potential emergency and should be followed up as soon as possible or go to the Emergency Department if any problems should occur.  Please show the CHEMOTHERAPY ALERT CARD or IMMUNOTHERAPY ALERT CARD at check-in to the Emergency Department and triage nurse.  Should you have questions after your  visit or need to cancel or reschedule your appointment, please contact [facility]  [phone] and follow the prompts.  Office hours are 8:00 a.m. to 4:30 p.m. Monday - Friday. Please note that voicemails left after 4:00 p.m. may not be returned until the following business day.  We are closed weekends and major holidays. You have access to a nurse at all times for urgent questions. Please call the main number to the clinic [phone] and follow the prompts.  For any non-urgent questions, you may also contact your provider using MyChart. We now offer e-Visits for anyone 18 and older to request care online for non-urgent symptoms. For details visit [email or web address].   Also download the MyChart app! Go to the app store, search "MyChart", open the app, select [facility], and log in with your MyChart username and password.  Masks are optional in the cancer centers. If you would like for your care team to wear a mask while they are taking care of you, please let them know. For doctor visits, patients may have with them one support person who is at least 63 years old. At this time, visitors are not allowed in the infusion area.   

## 2022-03-06 ENCOUNTER — Encounter: Payer: Medicaid Other | Attending: Physician Assistant | Admitting: Physician Assistant

## 2022-03-06 DIAGNOSIS — L97322 Non-pressure chronic ulcer of left ankle with fat layer exposed: Secondary | ICD-10-CM | POA: Insufficient documentation

## 2022-03-06 DIAGNOSIS — I739 Peripheral vascular disease, unspecified: Secondary | ICD-10-CM | POA: Diagnosis not present

## 2022-03-06 DIAGNOSIS — B1920 Unspecified viral hepatitis C without hepatic coma: Secondary | ICD-10-CM | POA: Insufficient documentation

## 2022-03-06 DIAGNOSIS — L299 Pruritus, unspecified: Secondary | ICD-10-CM | POA: Diagnosis not present

## 2022-03-06 DIAGNOSIS — J449 Chronic obstructive pulmonary disease, unspecified: Secondary | ICD-10-CM | POA: Diagnosis not present

## 2022-03-06 DIAGNOSIS — I69354 Hemiplegia and hemiparesis following cerebral infarction affecting left non-dominant side: Secondary | ICD-10-CM | POA: Diagnosis not present

## 2022-03-06 DIAGNOSIS — I2510 Atherosclerotic heart disease of native coronary artery without angina pectoris: Secondary | ICD-10-CM | POA: Insufficient documentation

## 2022-03-06 DIAGNOSIS — I872 Venous insufficiency (chronic) (peripheral): Secondary | ICD-10-CM | POA: Insufficient documentation

## 2022-03-06 DIAGNOSIS — M1990 Unspecified osteoarthritis, unspecified site: Secondary | ICD-10-CM | POA: Diagnosis not present

## 2022-03-06 DIAGNOSIS — G629 Polyneuropathy, unspecified: Secondary | ICD-10-CM | POA: Diagnosis not present

## 2022-03-07 NOTE — Progress Notes (Signed)
ROMUALD, MCCASLIN ([id number]) Visit Report for 03/06/2022 Chief Complaint Document Details Patient Name: Chad [name], Chad [name] Date of Service: 03/06/2022 9:45 AM Medical Record Number: [id number] Patient Account Number: [id number] Date of Birth/Sex: 1960/09/04 (61 y.o. M) Treating RN: Cornell Barman Primary Care Provider: [name] Other Clinician: [name] Referring Provider: [name] Treating Provider/Extender: Skipper Cliche in Treatment: 12 Information Obtained from: Patient Chief Complaint Left ankle ulcers Electronic Signature(s) Signed: 03/06/2022 10:22:31 AM By: Worthy Keeler PA-C Entered By: Worthy Keeler on 03/06/2022 10:22:30 Chad [name] ([id number]) -------------------------------------------------------------------------------- Debridement Details Patient Name: Chad [name] Date of Service: 03/06/2022 9:45 AM Medical Record Number: [id number] Patient Account Number: [id number] Date of Birth/Sex: 1961-03-12 (61 y.o. M) Treating RN: Cornell Barman Primary Care Provider: [name] Other Clinician: [name] Referring Provider: [name] Treating Provider/Extender: Skipper Cliche in Treatment: 12 Debridement Performed for Wound #12 Left,Distal,Medial Ankle Assessment: Performed By: Physician Tommie Sams., PA-C Debridement Type: Debridement Severity of Tissue Pre Debridement: Fat layer exposed Level of Consciousness (Pre- Awake and Alert procedure): Pre-procedure Verification/Time Out Yes - 10:10 Taken: Start Time: 10:10 Total Area Debrided (L x W): 2.3 (cm) x 1.5 (cm) = 3.45 (cm) Tissue and other material Viable, Non-Viable, Slough, Subcutaneous, Biofilm, Slough debrided: Level: Skin/Subcutaneous Tissue Debridement Description: Excisional Instrument: Curette Bleeding: Minimum Hemostasis Achieved: Pressure End Time: 10:12 Response to Treatment: Procedure was tolerated well Level of Consciousness (Post- Awake and  Alert procedure): Post Debridement Measurements of Total Wound Length: (cm) 2.3 Width: (cm) 1.5 Depth: (cm) 0.2 Volume: (cm) 0.542 Character of Wound/Ulcer Post Debridement: Stable Severity of Tissue Post Debridement: Fat layer exposed Post Procedure Diagnosis Same as Pre-procedure Electronic Signature(s) Signed: 03/06/2022 11:26:52 AM By: Gretta Cool, BSN, RN, CWS, Kim RN, BSN Signed: 03/07/2022 4:28:06 PM By: [name] Signed: 03/07/2022 5:04:29 PM By: Worthy Keeler PA-C Entered By: [name] on 03/06/2022 10:13:26 Chad [name] ([id number]) -------------------------------------------------------------------------------- HPI Details Patient Name: Chad [name] Date of Service: 03/06/2022 9:45 AM Medical Record Number: [id number] Patient Account Number: [id number] Date of Birth/Sex: 1961/07/06 (61 y.o. M) Treating RN: Cornell Barman Primary Care Provider: [name] Other Clinician: [name] Referring Provider: [name] Treating Provider/Extender: Skipper Cliche in Treatment: 12 History of Present Illness HPI Description: 10/08/18 on evaluation today patient actually presents to our office for initial evaluation concerning wounds that he has of the bilateral lower extremities. 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 He also has bilateral venous stasis. He apparently has been homeless currently following discharge from the hospital apparently he has been placed at [facility] which is is a skilled nursing facility locally. Nonetheless fortunately he does not show any signs of infection at this time which is good news. In fact several of the wound actually appears to be showing some signs of improvement already in my pinion. There are a couple areas in the left leg in particular there likely  gonna require some sharp debridement to help clear away some necrotic tissue and help with more sufficient healing. No fevers, chills, nausea, or vomiting noted at this time. 10/15/18 on evaluation today patient actually appears to be doing very well in regard to his bilateral lower extremities. He's been tolerating the dressing changes without complication. Fortunately there does not appear to be any evidence of active infection at this time which is great news. Overall I'm actually very pleased  with how this has progressed in just one visits time. Readmission: 08/14/2020 upon evaluation today patient presents for re-evaluation here in our clinic. He is having issues with his left ankle region as well as his right toe and his right heel. He tells me that the toe and heel actually began as a area that was itching that he was scratching and then subsequently opened up into wounds. These may have been abscess areas I presume based on what I am seeing currently. With regard to his left ankle region he tells me this was a similar type occurrence although he does have venous stasis this very well may be more of a venous leg ulcer more than anything. Nonetheless I do believe that the patient would benefit from appropriate and aggressive wound care to try to help get things under better control here. He does have history of a stroke on the left side affecting him to some degree there that he is able to stand although he does have some residual weakness. Otherwise again the patient does have chronic venous insufficiency as previously noted. His arterial studies most recently obtained showed that he had an ABI on the right of 1.16 with a TBI of 0.52 and on the left and ABI of 1.14 with a TBI of 0.81. That was obtained on 06/19/2020. 08/28/2020 upon evaluation today patient appears to be doing decently well in regard to his wounds in general. He has been tolerating the dressing changes without complication.  Fortunately there does not appear to be any signs of active infection which is great news. With that being said I think the [facility] is doing a good job I would recommend that we likely continue with that currently. 09/11/2020 upon evaluation today patient's wounds did not appear to be doing too poorly but again he is not really showing signs of significant improvement with regard to any of the wounds on the right. None of them have Hydrofera Blue on them I am not exactly sure why this is not being followed as the facility did not contact us to let us know of any issues with obtaining dressings or otherwise. With that being said he is supposed to be using Hydrofera Blue on both of the wounds on the right foot as well as the ankle wound on the left side. 09/18/2020 upon evaluation today patient appears to be doing poorly with regard to his wounds. Again right now the left ankle in particular showed signs of extreme maceration. Apparently he was told by someone with staff at [facility] they could not get the [facility]. With that being said this is something that is never been relayed to [place] one way or another. Also the patient subsequently has not supposed to have a border gauze dressing on. He should have an ABD pad and roll gauze to secure as this drains much too much just to have a border gauze dressing to cover. Nonetheless the fact that they are not using the appropriate dressing is directly causing deterioration of the left ankle wound it is significantly worse today compared to what it was previous. I did attempt to call [facility] while the patient was here I called three times and got no one to even pick up the phone. After this I had my for an office coordinator call and she was able to finally get through and leave a message with the D ON as of dictation of this note which is roughly about an hour and a  half later I still have not been able to speak with anyone at the  facility. 09/25/2020 upon evaluation today patient actually showing signs of good improvement which is excellent news. He has been tolerating the dressing changes without complication. Fortunately there is no signs of active infection which is great news. No fevers, chills, nausea, vomiting, or diarrhea. I do feel like the facility has been doing a much better job at taking care of him as far as the dressings are concerned. However the director of nursing never did call me back. 10/09/2020 upon evaluation today patient appears to be doing well with regard to his wound. The toe ulcer did require some debridement but the other 2 areas actually appear to be doing quite well. 10/19/2020 upon evaluation today patient actually appears to be doing very well in regard to his wounds. In fact the heel does appear to be completely healed. The toe is doing better in the medial ankle on the left is also doing better. Overall I think he is headed in the right direction. 10/26/2020 upon evaluation today patient appears to be doing well with regard to his wound. He is showing signs of improvement which is great news and overall I am very pleased with where things stand today. No fevers, chills, nausea, vomiting, or diarrhea. 11/02/2020 upon evaluation today patient appears to be doing well with regard to his wounds. He has been tolerating the dressing changes without complication overall I am extremely pleased with where things stand today. He in regard to the toe is almost completely healed and the medial ankle on the left is doing much better. 11/09/2020 upon evaluation today patient appears to be doing a little poorly in regard to his left medial ankle ulcer. Fortunately there does not appear to be any signs of systemic infection but unfortunately locally he does appear to be infected in fact he has blue-green drainage consistent with Pseudomonas. PACER, DORN ([id number]) 11/16/2020 upon evaluation today patient  appears to be doing well with regard to his wound. It actually appears to be doing better. I did place him on gentamicin cream since the Cipro was actually resistant even though he was positive for Pseudomonas on culture. Overall I think that he does seem to be doing better though I am unsure whether or not they have actually been putting the cream on. The patient is not sure that we did talk to the nurse directly and she was going to initiate that treatment. Fortunately there does not appear to be any signs of active infection at this time. No fevers, chills, nausea, vomiting, or diarrhea. 4/28; the area on the right second toe is close to healed. Left medial ankle required debridement 12/07/2020 upon evaluation today patient appears to be doing well with regard to his wounds. In fact the right second toe appears to be completely healed which is great news. Fortunately there does not appear to be any signs of active infection at this time which is also great news. I think we can probably discontinue the gentamicin on top of everything else. 12/14/2020 upon evaluation today patient appears to be doing well with regard to his wound. He is making good progress and overall very pleased with where things stand today. There is no signs of active infection at this time which is great news. 12/28/2020 upon evaluation today patient appears to be doing well with regard to his wounds. He has been tolerating the dressing changes without complication. Fortunately there is no signs of active  infection at this time. No fevers, chills, nausea, vomiting, or diarrhea. 12/28/2020 upon evaluation today patient's wound bed actually showed signs of excellent improvement. He has great epithelization and granulation I do not see any signs of infection overall I am extremely pleased with where things stand at this point. No fevers, chills, nausea, vomiting, or diarrhea. 01/11/2021 upon evaluation today patient appears to be doing  well with regard to his wound on his leg. He has been tolerating the dressing changes without complication. Fortunately there does not appear to be any signs of active infection which is great news. No fevers, chills, nausea, vomiting, or diarrhea. 01/25/2021 upon evaluation today patient appears to be doing well with regard to his wound. He has been tolerating the dressing changes without complication. Fortunately the collagen seems to be doing a great job which is excellent news. No fevers, chills, nausea, vomiting, or diarrhea. 02/08/2021 upon evaluation today patient's wound is actually looking a little bit worse especially in the periwound compared to previous. Fortunately there does not appear to be any signs of infection which is great news with that being said he does have some irritation around the periphery of the wound which has me more concerned. He actually had a dressing on that had not been changed in 3 days. He also is supposed to have daily dressing changes. With regard to the dressing applied he had a silver alginate dressing and silver collagen is what is recommended and ordered. He also had no Desitin around the edges of the wound in the periwound region although that is on the order inspect to be done as well. In general I was very concerned I did contact [facility] actually spoke with Magda Paganini who is the scheduling individual and subsequently she stated that she would pass the information to the D ON apparently the D ON was not available to talk to me when I call today. 02/18/2021 upon evaluation today patient's wound is actually showing signs of improvement. Fortunately there does not appear to be any evidence of infection which is great news overall I am extremely pleased with where things stand today. No fevers, chills, nausea, vomiting, or diarrhea. 8/3; patient presents for 1 week follow-up. He has no issues or complaints today. He denies signs of infection. 03/11/2021  upon evaluation today patient appears to be doing well with regard to his wound. He does have a little bit of slough noted on the surface of the wound but fortunately there does not appear to be any signs of active infection at this time. No fevers, chills, nausea, vomiting, or diarrhea. 03/18/2021 upon evaluation today patient appears to be doing well with regard to his wound. He has been tolerating the dressing changes without complication. There was a little irritation more proximal to where the wound was that was not noted last week but nonetheless this is very superficial just seems to be more irritation we just need to make sure to put a good amount of the zinc over the area in my opinion. Otherwise he does not seem to be doing significantly worse at all which is great news. 03/25/2021 upon evaluation today patient appears to be doing well with regard to his wound. He is going require some sharp debridement today to clear with some of the necrotic debris. I did perform this today without complication postdebridement wound bed appears to be doing much better this is great news. 04/08/2021 upon evaluation today patient appears to be doing decently well in regard to  his wound although the overall measurement is not significantly smaller compared to previous. It is gone down a little bit but still the facility continues to not really put the appropriate dressings in place in fact he was supposed to have collagen we think he probably had more of an allergy to At this point. Fortunately there does not appear to be any signs of active infection systemically though locally I do not see anything on initial visualization either as far as erythema or warmth. 04/15/2021 upon evaluation today patient appears to be doing well with regard to his wound. He is actually showing signs of improvement. I did place him on antibiotics last week, Cipro. He has been taking that 2 times a day and seems to be tolerating it  very well. I do not see any evidence of worsening and in fact the overall appearance of the wound is smaller today which is also great news. 9/26; left medial ankle chronic venous insufficiency wound is improved. Using Hydrofera Blue 10/10; left medial ankle chronic venous insufficiency. Wound has not changed much in appearance completely nonviable surface. Apparently there have been problems getting the right product on the wound at the facility although he came in with [facility] on today 05/14/2021 upon evaluation today patient appears to be doing well with regard to his wound. I think he is making progress here which is good news. Fortunately there does not appear to be any signs of active infection at this time. No fevers, chills, nausea, vomiting, or diarrhea. 05/20/2021 upon evaluation today patient appears to be doing well with regard to his wound. He is showing signs of good improvement which is great news. There does not appear to be any evidence of active infection which is also excellent news. No fevers, chills, nausea, vomiting, or diarrhea. 05/28/2021 upon evaluation today patient appears to be doing quite well. There does not appear to be any signs of active infection at this time which is great news. Overall I am extremely pleased with where things stand today. I think he is headed in the right direction. 06/11/2021 upon evaluation today patient appears to be doing well with regard to his left ankle ulcer and poorly in regard to the toe ulcer on the second toe right foot. This appears to show signs of joint exposure. Apparently this has been present for 1 to 2 months although he kept [facility] ([id number]) forgetting to tell me about it. That is unfortunate as right now it definitely appears to be doing significantly worse than what I would like to see. There does not appear to be any signs of active infection systemically though locally I am concerned about the possibility  of infection the toe is quite red. Again no one from the facility ever contacted [place] to advise that this was going on in the interim either. 06/17/2021 upon evaluation today patient presents for follow-up I did review his x-ray which showed a navicular bone fracture I am unsure of the chronicity of this. Subsequently he also had osteomyelitis of the toe which was what I was more concerned about this did not show up on x-ray but did show up on the pathology scrapings. This was listed as acute osteomyelitis. Nonetheless at this point I think that the antibiotic treatment is the best regimen to go with currently. The patient is in agreement with that plan. Nonetheless he has initially 30 days of doxycycline off likely extend that towards the end of the treatment cycle that will be  around the middle of December for an additional 2 weeks. That all depends on how well he continues to heal. Nonetheless based on what I am seeing in the foot I did want a proceed with an MRI as well which I think will be helpful to identify if there is anything else that needs to be addressed from the standpoint of infection. 06/24/2021 upon evaluation today patient appears to be doing pretty well in regards to his wounds. I think both are actually showing signs of improvement which is good I did review his MRI today which did show signs of osteomyelitis of the middle and proximal phalanx on his right foot of the affected toe. With that being said this is actually showing signs of significant improvement today already with the antibiotic therapy I think the redness is also improved. Overall I think that we just need to give this some time with appropriate wound care we will see how things go potentially hyperbarics could be considered. 07/02/2021 upon inspection today patient actually appears to be doing well in regard to his left ankle which is getting very close to complete resolution of pleased in that regard. Unfortunately he  is continuing to have issues with his second toe right foot and this seems to still be very painful for him. Recommend he try something different from the standpoint of antibiotics. 07/15/2021 upon evaluation today patient appears to be doing actually pretty well in regard to his foot. This is actually showing signs of significant improvement which is great news. Overall I feel like the patient is improving both in regard to the second toe as well as the ankle on the left. With that being said the biggest issue that I do see currently is that he is needing to have a refill of the doxycycline that we previously treated him with. He also did see podiatry they are not going to recommend any amputation at this point since he seems to be doing quite well. For that reason we just need to keep things under control from an infection standpoint. 08/01/2021 upon evaluation today patient appears to be doing well with regard to his wound. He has been tolerating the dressing changes without complication. Fortunately there does not appear to be any evidence of active infection locally nor systemically at this point. In fact I think everything is doing excellent in fact his second toe on the right foot is almost healed and the ankle on the left ankle region is actually very close to being healed as well. 08/08/2021 upon evaluation today patient appears to be doing well with regard to his wound. He has been tolerating the dressing changes without complication. Fortunately I do not see any signs of active infection at this time. Readmission: 12-06-2021 upon evaluation today patient presents for reevaluation here in the clinic he does tell me that he was being seen in facility at [place] by a provider that was coming in. He is not sure who this was. He tells me however that the wound seems to have gotten worse even compared to where it was when we last saw him at this point. With that being said I do believe  that he is likely going need ongoing wound care here in the clinic and I do believe that we need to be the ones to frontline this since his wound does seem to be getting worse not better at this point. He voiced understanding. He is also in agreement with this plan and feels more  comfortable coming here she tells me. Patient's medical history really has not changed since his prior admission he was only gone since January. 12-27-2021 upon evaluation today patient appears to be doing well with regard to his wound they did run out of the [facility] so they did not put anything on just an ABD pad with gentamicin. Still we are seeing some signs of good improvement here with some new epithelization which is great news. 01-10-2022 upon evaluation today patient appears to be doing well with regard to his wounds and he is going require some sharp debridement but overall seems to be making good progress. Fortunately I do not see any evidence of active infection locally or systemically at this time which is great news. 01-24-2022 upon evaluation today patient appears to be doing well with regard to his wound. The facility actually came and dropped him off early and he had another appointment at the hospital and then they just brought him over here and this was still hours before his appointment this afternoon. For that reason we did do our best to work him in this morning and fortunately had some space to make this happen. With that being said patient's wound does seem to be making progress here and I am very pleased in that regard I do not see any signs of active infection locally or systemically at this time. 02-07-2022 patient appears to be doing well currently in regard to his wounds. In fact one of them the more proximal is healed the distal is still open but seems to be doing excellent. Fortunately I do not see any evidence of active infection locally or systemically at this time which is great news. No  fevers, chills, nausea, vomiting, or diarrhea. 7/27; left medial ankle venous. Improving per our intake nurse. We are using Hydrofera Blue under Tubigrip compression. They are changing that at his facility 03-06-2022 upon evaluation today patient appears to be doing well with regard to the his wound he is can require some sharp debridement but seems to be making excellent progress. Fortunately I do not see any evidence of active infection locally or systemically which is great news. Electronic Signature(s) Signed: 03/06/2022 10:25:11 AM By: Worthy Keeler PA-C Entered By: Worthy Keeler on 03/06/2022 10:25:10 Chad [name] ([id number]) -------------------------------------------------------------------------------- Physical Exam Details Patient Name: Chad [name] Date of Service: 03/06/2022 9:45 AM Medical Record Number: [id number] Patient Account Number: [id number] Date of Birth/Sex: 11-Jul-1961 (61 y.o. M) Treating RN: Cornell Barman Primary Care Provider: [name] Other Clinician: [name] Referring Provider: [name] Treating Provider/Extender: Skipper Cliche in Treatment: 68 Constitutional Well-nourished and well-hydrated in no acute distress. Respiratory normal breathing without difficulty. Psychiatric this patient is able to make decisions and demonstrates good insight into disease process. Alert and Oriented x 3. pleasant and cooperative. Notes Upon inspection patient's wound bed actually did require some sharp debridement clearway necrotic debris patient tolerated this today without complication postdebridement wound bed appears to be doing much better which is great news. Electronic Signature(s) Signed: 03/06/2022 10:25:25 AM By: Worthy Keeler PA-C Entered By: Worthy Keeler on 03/06/2022 10:25:25 Chad [name] ([id number]) -------------------------------------------------------------------------------- Physician Orders Details Patient Name: Chad [name] Date of Service: 03/06/2022 9:45 AM Medical Record Number: [id number] Patient Account Number: [id number] Date of Birth/Sex: [birth year] (61 y.o. M) Treating RN: Cornell Barman Primary Care Provider: [name] Other Clinician: [name] Referring Provider: [name] Treating Provider/Extender: Skipper Cliche in Treatment: 12 Verbal / Phone Orders: No Diagnosis  Coding Follow-up Appointments o Return Appointment in 2 weeks. Bathing/ Shower/ Hygiene o Wash wounds with antibacterial soap and water. o No tub bath. Anesthetic (Use 'Patient Medications' Section for Anesthetic Order Entry) o Lidocaine applied to wound bed Wound Treatment Wound #12 - Ankle Wound Laterality: Left, Medial, Distal Cleanser: Soap and Water 3 x Per Week/30 Days Discharge Instructions: Gently cleanse wound with antibacterial soap, rinse and pat dry prior to dressing wounds Cleanser: Wound Cleanser 3 x Per Week/30 Days Discharge Instructions: Wash your hands with soap and water. Remove old dressing, discard into plastic bag and place into trash. Cleanse the wound with Wound Cleanser prior to applying a clean dressing using gauze sponges, not tissues or cotton balls. Do not scrub or use excessive force. Pat dry using gauze sponges, not tissue or cotton balls. Peri-Wound Care: Desitin Maximum Strength Ointment 4 (oz) 3 x Per Week/30 Days Discharge Instructions: Apply around wound edges to avoid maceration Topical: Gentamicin 3 x Per Week/30 Days Discharge Instructions: Apply as directed by provider. Primary Dressing: Hydrofera Blue Ready Transfer Foam, 2.5x2.5 (in/in) 3 x Per Week/30 Days Discharge Instructions: Apply Hydrofera Blue Ready to wound bed as directed Secondary Dressing: ABD Pad 5x9 (in/in) 3 x Per Week/30 Days Discharge Instructions: Cover with ABD pad Secured With: Medipore Tape - 2M Medipore H Soft Cloth Surgical Tape, 2x2 (in/yd) 3 x Per Week/30 Days Secured With: [employer]  Sterile or Non-Sterile 6-ply 4.5x4 (yd/yd) 3 x Per Week/30 Days Discharge Instructions: Apply Kerlix as directed Secured With: Tubigrip Size C, 2.75x10 (in/yd) 3 x Per Week/30 Days Discharge Instructions: Apply 3 Tubigrip C 3-finger-widths below knee to base of toes to secure dressing and/or for swelling. This can be hand washed and air dried Electronic Signature(s) Signed: 03/07/2022 4:28:06 PM By: [name] Signed: 03/07/2022 5:04:29 PM By: Worthy Keeler PA-C Entered By: [name] on 03/06/2022 10:14:01 Chad [name] ([id number]) -------------------------------------------------------------------------------- Problem List Details Patient Name: Chad [name] Date of Service: 03/06/2022 9:45 AM Medical Record Number: [id number] Patient Account Number: [id number] Date of Birth/Sex: Apr 04, 1961 (61 y.o. 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 M) Treating RN: Cornell Barman Primary Care Provider: [name] Other Clinician: [name] Referring Provider: [name] Treating Provider/Extender: Skipper Cliche in Treatment: 12 Subjective Chief Complaint Information obtained from Patient Left ankle ulcers History of Present Illness (HPI) 10/08/18 on evaluation today patient actually presents to our office for initial evaluation concerning wounds that he has of the bilateral lower extremities. 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 He also has bilateral venous stasis. He apparently has been homeless currently following discharge from  the hospital apparently he has been placed at [facility] which is is a skilled nursing facility locally. Nonetheless fortunately he does not show any signs of infection at this time which is good news. In fact several of the wound actually appears to be showing some signs of improvement already in my pinion. There are a couple areas in the left leg in particular there likely gonna require some sharp debridement to help clear away some necrotic tissue and help with more sufficient healing. No fevers, chills, nausea, or vomiting noted at this time. 10/15/18 on evaluation today patient actually appears to be doing very well in regard to his bilateral lower extremities. He's been tolerating the dressing changes without complication. Fortunately there does not appear to be any evidence of active infection at this time which is great news. Overall I'm actually very pleased with how this has progressed in just one visits time. Readmission: 08/14/2020 upon evaluation today patient presents for re-evaluation here in our clinic. He is having issues with his left ankle region as well as his right toe and his right heel. He tells me that the toe and heel actually began  as a area that was itching that he was scratching and then subsequently opened up into wounds. These may have been abscess areas I presume based on what I am seeing currently. With regard to his left ankle region he tells me this was a similar type occurrence although he does have venous stasis this very well may be more of a venous leg ulcer more than anything. Nonetheless I do believe that the patient would benefit from appropriate and aggressive wound care to try to help get things under better control here. He does have history of a stroke on the left side affecting him to some degree there that he is able to stand although he does have some residual weakness. Otherwise again the patient does have chronic venous insufficiency as previously noted. His arterial studies most recently obtained showed that he had an ABI on the right of 1.16 with a TBI of 0.52 and on the left and ABI of 1.14 with a TBI of 0.81. That was obtained on 06/19/2020. 08/28/2020 upon evaluation today patient appears to be doing decently well in regard to his wounds in general. He has been tolerating the dressing changes without complication. Fortunately there does not appear to be any signs of active infection which is great news. With that being said I think the [facility] is doing a good job I would recommend that we likely continue with that currently. 09/11/2020 upon evaluation today patient's wounds did not appear to be doing too poorly but again he is not really showing signs of significant improvement with regard to any of the wounds on the right. None of them have Hydrofera Blue on them I am not exactly sure why this is not being followed as the facility did not contact us to let us know of any issues with obtaining dressings or otherwise. With that being said he is supposed to be using Hydrofera Blue on both of the wounds on the right foot as well as the ankle wound on the left side. 09/18/2020 upon evaluation today  patient appears to be doing poorly with regard to his wounds. Again right now the left ankle in particular showed signs of extreme maceration. Apparently he was told by someone with staff at [facility] they could not get the [facility]. With that being said this is something that  is never been relayed to [place] one way or another. Also the patient subsequently has not supposed to have a border gauze dressing on. He should have an ABD pad and roll gauze to secure as this drains much too much just to have a border gauze dressing to cover. Nonetheless the fact that they are not using the appropriate dressing is directly causing deterioration of the left ankle wound it is significantly worse today compared to what it was previous. I did attempt to call [facility] while the patient was here I called three times and got no one to even pick up the phone. After this I had my for an office coordinator call and she was able to finally get through and leave a message with the D ON as of dictation of this note which is roughly about an hour and a half later I still have not been able to speak with anyone at the facility. 09/25/2020 upon evaluation today patient actually showing signs of good improvement which is excellent news. He has been tolerating the dressing changes without complication. Fortunately there is no signs of active infection which is great news. No fevers, chills, nausea, vomiting, or diarrhea. I do feel like the facility has been doing a much better job at taking care of him as far as the dressings are concerned. However the director of nursing never did call me back. 10/09/2020 upon evaluation today patient appears to be doing well with regard to his wound. The toe ulcer did require some debridement but the other 2 areas actually appear to be doing quite well. 10/19/2020 upon evaluation today patient actually appears to be doing very well in regard to his wounds. In fact the heel  does appear to be completely healed. The toe is doing better in the medial ankle on the left is also doing better. Overall I think he is headed in the right direction. 10/26/2020 upon evaluation today patient appears to be doing well with regard to his wound. He is showing signs of improvement which is great news and overall I am very pleased with where things stand today. No fevers, chills, nausea, vomiting, or diarrhea. 11/02/2020 upon evaluation today patient appears to be doing well with regard to his wounds. He has been tolerating the dressing changes without complication overall I am extremely pleased with where things stand today. He in regard to the toe is almost completely healed and the medial [facility] ([id number]) ankle on the left is doing much better. 11/09/2020 upon evaluation today patient appears to be doing a little poorly in regard to his left medial ankle ulcer. Fortunately there does not appear to be any signs of systemic infection but unfortunately locally he does appear to be infected in fact he has blue-green drainage consistent with Pseudomonas. 11/16/2020 upon evaluation today patient appears to be doing well with regard to his wound. It actually appears to be doing better. I did place him on gentamicin cream since the Cipro was actually resistant even though he was positive for Pseudomonas on culture. Overall I think that he does seem to be doing better though I am unsure whether or not they have actually been putting the cream on. The patient is not sure that we did talk to the nurse directly and she was going to initiate that treatment. Fortunately there does not appear to be any signs of active infection at this time. No fevers, chills, nausea, vomiting, or diarrhea. 4/28; the area on the right second toe  is close to healed. Left medial ankle required debridement 12/07/2020 upon evaluation today patient appears to be doing well with regard to his wounds. In fact the  right second toe appears to be completely healed which is great news. Fortunately there does not appear to be any signs of active infection at this time which is also great news. I think we can probably discontinue the gentamicin on top of everything else. 12/14/2020 upon evaluation today patient appears to be doing well with regard to his wound. He is making good progress and overall very pleased with where things stand today. There is no signs of active infection at this time which is great news. 12/28/2020 upon evaluation today patient appears to be doing well with regard to his wounds. He has been tolerating the dressing changes without complication. Fortunately there is no signs of active infection at this time. No fevers, chills, nausea, vomiting, or diarrhea. 12/28/2020 upon evaluation today patient's wound bed actually showed signs of excellent improvement. He has great epithelization and granulation I do not see any signs of infection overall I am extremely pleased with where things stand at this point. No fevers, chills, nausea, vomiting, or diarrhea. 01/11/2021 upon evaluation today patient appears to be doing well with regard to his wound on his leg. He has been tolerating the dressing changes without complication. Fortunately there does not appear to be any signs of active infection which is great news. No fevers, chills, nausea, vomiting, or diarrhea. 01/25/2021 upon evaluation today patient appears to be doing well with regard to his wound. He has been tolerating the dressing changes without complication. Fortunately the collagen seems to be doing a great job which is excellent news. No fevers, chills, nausea, vomiting, or diarrhea. 02/08/2021 upon evaluation today patient's wound is actually looking a little bit worse especially in the periwound compared to previous. Fortunately there does not appear to be any signs of infection which is great news with that being said he does have some  irritation around the periphery of the wound which has me more concerned. He actually had a dressing on that had not been changed in 3 days. He also is supposed to have daily dressing changes. With regard to the dressing applied he had a silver alginate dressing and silver collagen is what is recommended and ordered. He also had no Desitin around the edges of the wound in the periwound region although that is on the order inspect to be done as well. In general I was very concerned I did contact [facility] actually spoke with Magda Paganini who is the scheduling individual and subsequently she stated that she would pass the information to the D ON apparently the D ON was not available to talk to me when I call today. 02/18/2021 upon evaluation today patient's wound is actually showing signs of improvement. Fortunately there does not appear to be any evidence of infection which is great news overall I am extremely pleased with where things stand today. No fevers, chills, nausea, vomiting, or diarrhea. 8/3; patient presents for 1 week follow-up. He has no issues or complaints today. He denies signs of infection. 03/11/2021 upon evaluation today patient appears to be doing well with regard to his wound. He does have a little bit of slough noted on the surface of the wound but fortunately there does not appear to be any signs of active infection at this time. No fevers, chills, nausea, vomiting, or diarrhea. 03/18/2021 upon evaluation today patient appears to be doing  well with regard to his wound. He has been tolerating the dressing changes without complication. There was a little irritation more proximal to where the wound was that was not noted last week but nonetheless this is very superficial just seems to be more irritation we just need to make sure to put a good amount of the zinc over the area in my opinion. Otherwise he does not seem to be doing significantly worse at all which is great  news. 03/25/2021 upon evaluation today patient appears to be doing well with regard to his wound. He is going require some sharp debridement today to clear with some of the necrotic debris. I did perform this today without complication postdebridement wound bed appears to be doing much better this is great news. 04/08/2021 upon evaluation today patient appears to be doing decently well in regard to his wound although the overall measurement is not significantly smaller compared to previous. It is gone down a little bit but still the facility continues to not really put the appropriate dressings in place in fact he was supposed to have collagen we think he probably had more of an allergy to At this point. Fortunately there does not appear to be any signs of active infection systemically though locally I do not see anything on initial visualization either as far as erythema or warmth. 04/15/2021 upon evaluation today patient appears to be doing well with regard to his wound. He is actually showing signs of improvement. I did place him on antibiotics last week, Cipro. He has been taking that 2 times a day and seems to be tolerating it very well. I do not see any evidence of worsening and in fact the overall appearance of the wound is smaller today which is also great news. 9/26; left medial ankle chronic venous insufficiency wound is improved. Using Hydrofera Blue 10/10; left medial ankle chronic venous insufficiency. Wound has not changed much in appearance completely nonviable surface. Apparently there have been problems getting the right product on the wound at the facility although he came in with [facility] on today 05/14/2021 upon evaluation today patient appears to be doing well with regard to his wound. I think he is making progress here which is good news. Fortunately there does not appear to be any signs of active infection at this time. No fevers, chills, nausea, vomiting, or  diarrhea. 05/20/2021 upon evaluation today patient appears to be doing well with regard to his wound. He is showing signs of good improvement which is great news. There does not appear to be any evidence of active infection which is also excellent news. No fevers, chills, nausea, vomiting, or diarrhea. AYOMIDE, PURDY ([id number]) 05/28/2021 upon evaluation today patient appears to be doing quite well. There does not appear to be any signs of active infection at this time which is great news. Overall I am extremely pleased with where things stand today. I think he is headed in the right direction. 06/11/2021 upon evaluation today patient appears to be doing well with regard to his left ankle ulcer and poorly in regard to the toe ulcer on the second toe right foot. This appears to show signs of joint exposure. Apparently this has been present for 1 to 2 months although he kept forgetting to tell me about it. That is unfortunate as right now it definitely appears to be doing significantly worse than what I would like to see. There does not appear to be any signs of active infection systemically though  locally I am concerned about the possibility of infection the toe is quite red. Again no one from the facility ever contacted [place] to advise that this was going on in the interim either. 06/17/2021 upon evaluation today patient presents for follow-up I did review his x-ray which showed a navicular bone fracture I am unsure of the chronicity of this. Subsequently he also had osteomyelitis of the toe which was what I was more concerned about this did not show up on x-ray but did show up on the pathology scrapings. This was listed as acute osteomyelitis. Nonetheless at this point I think that the antibiotic treatment is the best regimen to go with currently. The patient is in agreement with that plan. Nonetheless he has initially 30 days of doxycycline off likely extend that towards the end of the treatment  cycle that will be around the middle of December for an additional 2 weeks. That all depends on how well he continues to heal. Nonetheless based on what I am seeing in the foot I did want a proceed with an MRI as well which I think will be helpful to identify if there is anything else that needs to be addressed from the standpoint of infection. 06/24/2021 upon evaluation today patient appears to be doing pretty well in regards to his wounds. I think both are actually showing signs of improvement which is good I did review his MRI today which did show signs of osteomyelitis of the middle and proximal phalanx on his right foot of the affected toe. With that being said this is actually showing signs of significant improvement today already with the antibiotic therapy I think the redness is also improved. Overall I think that we just need to give this some time with appropriate wound care we will see how things go potentially hyperbarics could be considered. 07/02/2021 upon inspection today patient actually appears to be doing well in regard to his left ankle which is getting very close to complete resolution of pleased in that regard. Unfortunately he is continuing to have issues with his second toe right foot and this seems to still be very painful for him. Recommend he try something different from the standpoint of antibiotics. 07/15/2021 upon evaluation today patient appears to be doing actually pretty well in regard to his foot. This is actually showing signs of significant improvement which is great news. Overall I feel like the patient is improving both in regard to the second toe as well as the ankle on the left. With that being said the biggest issue that I do see currently is that he is needing to have a refill of the doxycycline that we previously treated him with. He also did see podiatry they are not going to recommend any amputation at this point since he seems to be doing quite well. For  that reason we just need to keep things under control from an infection standpoint. 08/01/2021 upon evaluation today patient appears to be doing well with regard to his wound. He has been tolerating the dressing changes without complication. Fortunately there does not appear to be any evidence of active infection locally nor systemically at this point. In fact I think everything is doing excellent in fact his second toe on the right foot is almost healed and the ankle on the left ankle region is actually very close to being healed as well. 08/08/2021 upon evaluation today patient appears to be doing well with regard to his wound. He has been tolerating the  dressing changes without complication. Fortunately I do not see any signs of active infection at this time. Readmission: 12-06-2021 upon evaluation today patient presents for reevaluation here in the clinic he does tell me that he was being seen in facility at [facility] by a provider that was coming in. He is not sure who this was. He tells me however that the wound seems to have gotten worse even compared to where it was when we last saw him at this point. With that being said I do believe that he is likely going need ongoing wound care here in the clinic and I do believe that we need to be the ones to frontline this since his wound does seem to be getting worse not better at this point. He voiced understanding. He is also in agreement with this plan and feels more comfortable coming here she tells me. Patient's medical history really has not changed since his prior admission he was only gone since January. 12-27-2021 upon evaluation today patient appears to be doing well with regard to his wound they did run out of the [facility] so they did not put anything on just an ABD pad with gentamicin. Still we are seeing some signs of good improvement here with some new epithelization which is great news. 01-10-2022 upon evaluation today patient  appears to be doing well with regard to his wounds and he is going require some sharp debridement but overall seems to be making good progress. Fortunately I do not see any evidence of active infection locally or systemically at this time which is great news. 01-24-2022 upon evaluation today patient appears to be doing well with regard to his wound. The facility actually came and dropped him off early and he had another appointment at the hospital and then they just brought him over here and this was still hours before his appointment this afternoon. For that reason we did do our best to work him in this morning and fortunately had some space to make this happen. With that being said patient's wound does seem to be making progress here and I am very pleased in that regard I do not see any signs of active infection locally or systemically at this time. 02-07-2022 patient appears to be doing well currently in regard to his wounds. In fact one of them the more proximal is healed the distal is still open but seems to be doing excellent. Fortunately I do not see any evidence of active infection locally or systemically at this time which is great news. No fevers, chills, nausea, vomiting, or diarrhea. 7/27; left medial ankle venous. Improving per our intake nurse. We are using Hydrofera Blue under Tubigrip compression. They are changing that at his facility 03-06-2022 upon evaluation today patient appears to be doing well with regard to the his wound he is can require some sharp debridement but seems to be making excellent progress. Fortunately I do not see any evidence of active infection locally or systemically which is great news. KEELAN, TRIPODI ([id number]) Objective Constitutional Well-nourished and well-hydrated in no acute distress. Vitals Time Taken: 9:47 AM, Height: 67 in, Weight: 150 lbs, BMI: 23.5, Temperature: 98.2 F, Pulse: 78 bpm, Respiratory Rate: 18 breaths/min, Blood Pressure: 133/87  mmHg. Respiratory normal breathing without difficulty. Psychiatric this patient is able to make decisions and demonstrates good insight into disease process. Alert and Oriented x 3. pleasant and cooperative. General Notes: Upon inspection patient's wound bed actually did require some sharp debridement clearway necrotic  debris patient tolerated this today without complication postdebridement wound bed appears to be doing much better which is great news. Integumentary (Hair, Skin) Wound #12 status is Open. Original cause of wound was Gradually Appeared. The date acquired was: 07/12/2019. The wound has been in treatment 12 weeks. The wound is located on the Left,Distal,Medial Ankle. The wound measures 2.3cm length x 1.5cm width x 0.2cm depth; 2.71cm^2 area and 0.542cm^3 volume. There is Fat Layer (Subcutaneous Tissue) exposed. There is a small amount of serosanguineous drainage noted. There is medium (34-66%) pink granulation within the wound bed. There is a medium (34-66%) amount of necrotic tissue within the wound bed including Adherent Slough. 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 There was a Excisional Skin/Subcutaneous Tissue Debridement with a total area of 3.45 sq cm performed by Tommie Sams., PA-C. With the following instrument(s): Curette to remove Viable and Non-Viable tissue/material. Material removed includes Subcutaneous Tissue, Slough, and Biofilm. A time out was conducted at 10:10, prior to the start of the procedure. A Minimum amount of bleeding was controlled  with Pressure. The procedure was tolerated well. Post Debridement Measurements: 2.3cm length x 1.5cm width x 0.2cm depth; 0.542cm^3 volume. Character of Wound/Ulcer Post Debridement is stable. Severity of Tissue Post Debridement is: Fat layer exposed. Post procedure Diagnosis Wound #12: Same as Pre-Procedure Plan Follow-up Appointments: Return Appointment in 2 weeks. Bathing/ Shower/ Hygiene: Wash wounds with antibacterial soap and water. No tub bath. Anesthetic (Use 'Patient Medications' Section for Anesthetic Order Entry): Lidocaine applied to wound bed WOUND #12: - Ankle Wound Laterality: Left, Medial, Distal Cleanser: Soap and Water 3 x Per Week/30 Days Discharge Instructions: Gently cleanse wound with antibacterial soap, rinse and pat dry prior to dressing wounds Ackers, Shravan ([id number]) Cleanser: Wound Cleanser 3 x Per Week/30 Days Discharge Instructions: Wash your hands with soap and water. Remove old dressing, discard into plastic bag and place into trash. Cleanse the wound with Wound Cleanser prior to applying a clean dressing using gauze sponges, not tissues or cotton balls. Do not scrub or use excessive force. Pat dry using gauze sponges, not tissue or cotton balls. Peri-Wound Care: Desitin Maximum Strength Ointment 4 (oz) 3 x Per Week/30 Days Discharge Instructions: Apply around wound edges to avoid maceration Topical: Gentamicin 3 x Per Week/30 Days Discharge Instructions: Apply as directed by provider. Primary Dressing: Hydrofera Blue Ready Transfer Foam, 2.5x2.5 (in/in) 3 x Per Week/30 Days Discharge Instructions: Apply Hydrofera Blue Ready to wound bed as directed Secondary Dressing: ABD Pad 5x9 (in/in) 3 x Per Week/30 Days Discharge Instructions: Cover with ABD pad Secured With: Medipore Tape - 12M Medipore H Soft Cloth Surgical Tape, 2x2 (in/yd) 3 x Per Week/30 Days Secured With: [employer] Sterile or Non-Sterile 6-ply 4.5x4 (yd/yd) 3 x Per Week/30 Days Discharge  Instructions: Apply Kerlix as directed Secured With: Tubigrip Size C, 2.75x10 (in/yd) 3 x Per Week/30 Days Discharge Instructions: Apply 3 Tubigrip C 3-finger-widths below knee to base of toes to secure dressing and/or for swelling. This can be hand washed and air dried 1. I would recommend currently that we going continue with the wound care measures as before and the patient is in agreement  with plan this includes the use of the [facility], The dressing which is gentamicin underneath. 2. Also can continue with the Desitin around the edges of the wound. 3. I am also can recommend the Tubigrip size C after applying the roll gauze. We will see patient back for reevaluation in 1 week here in the clinic. If anything worsens or changes patient will contact our office for additional recommendations. Electronic Signature(s) Signed: 03/06/2022 10:25:52 AM By: Worthy Keeler PA-C Entered By: Worthy Keeler on 03/06/2022 10:25:52 Chad [name] ([id number]) -------------------------------------------------------------------------------- SuperBill Details Patient Name: Chad [name] Date of Service: 03/06/2022 Medical Record Number: [id number] Patient Account Number: [id number] Date of Birth/Sex: Jan 29, 1961 (61 y.o. M) Treating RN: Cornell Barman Primary Care Provider: [name] Other Clinician: [name] Referring Provider: [name] Treating Provider/Extender: Skipper Cliche in Treatment: 12 Diagnosis Coding ICD-10 Codes Code Description [phone] Chronic venous hypertension (idiopathic) with ulcer and inflammation of left lower extremity L97.322 Non-pressure chronic ulcer of left ankle with fat layer exposed 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 side Facility Procedures CPT4 Code: [id number] Description: 54492 - DEB SUBQ TISSUE 20 SQ CM/< Modifier: Quantity: 1 CPT4 Code: Description: ICD-10 Diagnosis Description E10.071  Non-pressure chronic ulcer of left ankle with fat layer exposed Modifier: Quantity: Physician Procedures CPT4 Code: 2197588 Description: 11042 - WC PHYS SUBQ TISS 20 SQ CM Modifier: Quantity: 1 CPT4 Code: Description: ICD-10 Diagnosis Description T25.498 Non-pressure chronic ulcer of left ankle with fat layer exposed Modifier: Quantity: Electronic Signature(s) Signed: 03/06/2022 10:26:01 AM By: Worthy Keeler PA-C Entered By: Worthy Keeler on 03/06/2022 10:26:01

## 2022-03-07 NOTE — Progress Notes (Signed)
AVONDRE, RICHENS ([id number]) Visit Report for 03/06/2022 Arrival Information Details Patient Name: Chad [name], Chad [name] Date of Service: 03/06/2022 9:45 AM Medical Record Number: [id number] Patient Account Number: [id number] Date of Birth/Sex: 1960/12/20 (61 y.o. M) Treating RN: Cornell Barman Primary Care Myya Meenach: [name] Other Clinician: [name] Referring Dominique Calvey: [name] Treating Jermarion Poffenberger/Extender: Skipper Cliche in Treatment: 12 Visit Information History Since Last Visit All ordered tests and consults were completed: No Patient Arrived: Wheel Chair Added or deleted any medications: No Arrival Time: 09:46 Any new allergies or adverse reactions: No Transfer Assistance: EasyPivot Patient Lift Had a fall or experienced change in No Patient Requires Transmission-Based No activities of daily living that may affect Precautions: risk of falls: Patient Has Alerts: Yes Hospitalized since last visit: No Patient Alerts: Patient on Blood Thinner Pain Present Now: No NOT diabetic aspirin 77m [facility] Electronic Signature(s) Signed: 03/07/2022 4:28:06 PM By: VMassie KluverEntered By: VMassie Kluveron 03/06/2022 09:47:10 Chad [name]([id number] -------------------------------------------------------------------------------- Clinic Level of Care Assessment Details Patient Name: Chad SabalDate of Service: 03/06/2022 9:45 AM Medical Record Number: [id number] Account Number: [id number] of Birth/Sex: 306-18-1962(61 y.o. M) Treating RN: WCornell BarmanPrimary Care Shelina Luo: LErik ObeyOther Clinician: VMassie KluverReferring Carylon Tamburro: LErik ObeyTreating Stanley Helmuth/Extender: SSkipper Clichein Treatment: 12 Clinic Level of Care Assessment Items TOOL 1 Quantity Score [] - Use when EandM and Procedure is performed on INITIAL visit 0 ASSESSMENTS - Nursing Assessment / Reassessment [] - General Physical Exam (combine w/ comprehensive  assessment (listed just below) when performed on new 0 pt. evals) [] - 0 Comprehensive Assessment (HX, ROS, Risk Assessments, Wounds Hx, etc.) ASSESSMENTS - Wound and Skin Assessment / Reassessment [] - Dermatologic / Skin Assessment (not related to wound area) 0 ASSESSMENTS - Ostomy and/or Continence Assessment and Care [] - Incontinence Assessment and Management 0 [] - 0 Ostomy Care Assessment and Management (repouching, etc.) PROCESS - Coordination of Care [] - Simple Patient / Family Education for ongoing care 0 [] - 0 Complex (extensive) Patient / Family Education for ongoing care [] - 0 Staff obtains Consents, Records, Test Results / Process Orders [] - 0 Staff telephones HHA, Nursing Homes / Clarify orders / etc [] - 0 Routine Transfer to another Facility (non-emergent condition) [] - 0 Routine Hospital Admission (non-emergent condition) [] - 0 New Admissions / IBiomedical engineer/ Ordering NPWT, Apligraf, etc. [] - 0 Emergency Hospital Admission (emergent condition) PROCESS - Special Needs [] - Pediatric / Minor Patient Management 0 [] - 0 Isolation Patient Management [] - 0 Hearing / Language / Visual special needs [] - 0 Assessment of Community assistance (transportation, D/C planning, etc.) [] - 0 Additional assistance / Altered mentation [] - 0 Support Surface(s) Assessment (bed, cushion, seat, etc.) INTERVENTIONS - Miscellaneous [] - External ear exam 0 [] - 0 Patient Transfer (multiple staff / HCivil Service fast streamer/ Similar devices) [] - 0 Simple Staple / Suture removal (25 or less) [] - 0 Complex Staple / Suture removal (26 or more) [] - 0 Hypo/Hyperglycemic Management (do not check if billed separately) [] - 0 Ankle / Brachial Index (ABI) - do not check if billed separately Has the patient been seen at the hospital within the last three years: Yes Total Score: 0 Level Of Care: ____ Chad [name]([id number] Electronic Signature(s) Signed:  03/07/2022 4:28:06 PM By: VMassie KluverEntered By: VMassie Kluveron 03/06/2022 10:14:05 Chad [name]([id number] -------------------------------------------------------------------------------- Encounter Discharge Information Details Patient  Name: Chad [name], Chad [name] Date of Service: 03/06/2022 9:45 AM Medical Record Number: [id number] Patient Account Number: [id number] Date of Birth/Sex: [birth year] (61 y.o. M) Treating RN: Cornell Barman Primary Care Cicero Noy: [name] Other Clinician: [name] Referring Britiany Silbernagel: [name] Treating Coty Student/Extender: Skipper Cliche in Treatment: 12 Encounter Discharge Information Items Post Procedure Vitals Discharge Condition: Stable Temperature (F): 98.2 Ambulatory Status: Wheelchair Pulse (bpm): 78 Discharge Destination: [place] Respiratory Rate (breaths/min): 18 Telephoned: Yes Blood Pressure (mmHg): 133/87 Orders Sent: Yes Transportation: Other Accompanied By: self Schedule Follow-up Appointment: Yes Clinical Summary of Care: Electronic Signature(s) Signed: 03/07/2022 4:28:06 PM By: [name] Entered By: [name] on 03/06/2022 10:31:11 Chad [name] ([id number]) -------------------------------------------------------------------------------- Lower Extremity Assessment Details Patient Name: Chad [name] Date of Service: 03/06/2022 9:45 AM Medical Record Number: [id number] Patient Account Number: [id number] Date of Birth/Sex: 1960/09/30 (61 y.o. M) Treating RN: Cornell Barman Primary Care Cashae Weich: [name] Other Clinician: [name] Referring Morene Cecilio: [name] Treating Kampbell Holaway/Extender: Jeri Cos Weeks in Treatment: 12 Edema Assessment Assessed: Shirlyn Goltz: Yes] Patrice Paradise: No] Edema: [Left: Ye] [Right: s] Calf Left: Right: Point of Measurement: 31 cm From Medial Instep 32 cm Ankle Left: Right: Point of Measurement: 11 cm From Medial Instep 22.4 cm Vascular  Assessment Pulses: Dorsalis Pedis Palpable: [Left:Yes] Posterior Tibial Palpable: [Left:Yes] Electronic Signature(s) Signed: 03/06/2022 11:26:52 AM By: Gretta Cool, BSN, RN, CWS, Kim RN, BSN Signed: 03/07/2022 4:28:06 PM By: [name] Entered By: [name] on 03/06/2022 09:56:49 Chad [name] ([id number]) -------------------------------------------------------------------------------- Multi Wound Chart Details Patient Name: Chad [name] Date of Service: 03/06/2022 9:45 AM Medical Record Number: [id number] Patient Account Number: [id number] Date of Birth/Sex: 06-21-1961 (61 y.o. M) Treating RN: Cornell Barman Primary Care Kirstan Fentress: [name] Other Clinician: [name] Referring Tran Arzuaga: [name] Treating Tallulah Hosman/Extender: Skipper Cliche in Treatment: 12 Vital Signs Height(in): 89 Pulse(bpm): 55 Weight(lbs): 150 Blood Pressure(mmHg): 133/87 Body Mass Index(BMI): 23.5 Temperature(F): 98.2 Respiratory Rate(breaths/min): 18 Photos: [N/A:N/A] Wound Location: Left, Distal, Medial Ankle N/A N/A Wounding Event: Gradually Appeared N/A N/A Primary Etiology: Venous Leg Ulcer N/A N/A Comorbid History: Anemia, Chronic Obstructive N/A N/A Pulmonary Disease (COPD), Coronary Artery Disease, Peripheral Arterial Disease, Peripheral Venous Disease, Hepatitis C, Osteoarthritis, Neuropathy, Received Chemotherapy Date Acquired: 07/12/2019 N/A N/A Weeks of Treatment: 12 N/A N/A Wound Status: Open N/A N/A Wound Recurrence: No N/A N/A Measurements L x W x D (cm) 2.3x1.5x0.2 N/A N/A Area (cm) : 2.71 N/A N/A Volume (cm) : 0.542 N/A N/A % Reduction in Area: 60.80% N/A N/A % Reduction in Volume: 60.80% N/A N/A Classification: Full Thickness Without Exposed N/A N/A Support Structures Exudate Amount: Small N/A N/A Exudate Type: Serosanguineous N/A N/A Exudate Color: red, brown N/A N/A Granulation Amount: Medium (34-66%) N/A N/A Granulation Quality: Pink N/A  N/A Necrotic Amount: Medium (34-66%) N/A N/A Exposed Structures: Fat Layer (Subcutaneous Tissue): N/A N/A Yes Fascia: No Tendon: No Muscle: No Joint: No Bone: No Epithelialization: Small (1-33%) N/A N/A Treatment Notes Electronic Signature(s) Signed: 03/07/2022 4:28:06 PM By: Kristine Garbe ([id number]) Entered By: [name] on 03/06/2022 10:10:07 Chad [name] ([id number]) -------------------------------------------------------------------------------- Multi-Disciplinary Care Plan Details Patient Name: Chad [name] Date of Service: 03/06/2022 9:45 AM Medical Record Number: [id number] Patient Account Number: [id number] Date of Birth/Sex: 14-Nov-1960 (61 y.o. M) Treating RN: Cornell Barman Primary Care Kvion Shapley: [name] Other Clinician: [name] Referring Mekhi Lascola: [name] Treating Alisha Bacus/Extender: Skipper Cliche in Treatment: 12 Active Inactive Necrotic Tissue Nursing Diagnoses: Impaired tissue integrity related to necrotic/devitalized tissue Knowledge deficit related to management of necrotic/devitalized  tissue Goals: Necrotic/devitalized tissue will be minimized in the wound bed Date Initiated: 02/07/2022 Target Resolution Date: 02/07/2022 Goal Status: Active Patient/caregiver will verbalize understanding of reason and process for debridement of necrotic tissue Date Initiated: 02/07/2022 Target Resolution Date: 02/07/2022 Goal Status: Active Interventions: Assess patient pain level pre-, during and post procedure and prior to discharge Provide education on necrotic tissue and debridement process Treatment Activities: Excisional debridement : 02/07/2022 Notes: Venous Leg Ulcer Nursing Diagnoses: Knowledge deficit related to disease process and management Goals: Patient will maintain optimal edema control Date Initiated: 12/06/2021 Date Inactivated: 12/27/2021 Target Resolution Date: 01/03/2022 Goal Status: Met Patient/caregiver  will verbalize understanding of disease process and disease management Date Initiated: 12/06/2021 Target Resolution Date: 01/03/2022 Goal Status: Active Interventions: Assess peripheral edema status every visit. Compression as ordered Notes: Electronic Signature(s) Signed: 03/06/2022 11:26:52 AM By: Gretta Cool, BSN, RN, CWS, Kim RN, BSN Signed: 03/07/2022 4:28:06 PM By: [name] Entered By: [name] on 03/06/2022 10:10:00 Chad [name] ([id number]) -------------------------------------------------------------------------------- Pain Assessment Details Patient Name: Chad [name] Date of Service: 03/06/2022 9:45 AM Medical Record Number: [id number] Patient Account Number: [id number] Date of Birth/Sex: 08-13-1960 (61 y.o. M) Treating RN: Cornell Barman Primary Care Ayleah Hofmeister: [name] Other Clinician: [name] Referring Chyna Kneece: [name] Treating Azaylia Fong/Extender: Skipper Cliche in Treatment: 12 Active Problems Location of Pain Severity and Description of Pain Patient Has Paino No Site Locations Pain Management and Medication Current Pain Management: Electronic Signature(s) Signed: 03/06/2022 11:26:52 AM By: Gretta Cool, BSN, RN, CWS, Kim RN, BSN Signed: 03/07/2022 4:28:06 PM By: [name] Entered By: [name] on 03/06/2022 09:49:18 Chad [name] ([id number]) -------------------------------------------------------------------------------- Patient/Caregiver Education Details Patient Name: Chad [name] Date of Service: 03/06/2022 9:45 AM Medical Record Number: [id number] Patient Account Number: [id number] Date of Birth/Gender: 1961-04-28 (61 y.o. M) Treating RN: Cornell Barman Primary Care Physician: [name] Other Clinician: [name] Referring Physician: [name] Treating Physician/Extender: Skipper Cliche in Treatment: 12 Education Assessment Education Provided To: Patient Education Topics Provided Wound/Skin  Impairment: Handouts: Other: continue wound care as directed Electronic Signature(s) Signed: 03/07/2022 4:28:06 PM By: [name] Entered By: [name] on 03/06/2022 10:15:00 Chad [name] ([id number]) -------------------------------------------------------------------------------- Wound Assessment Details Patient Name: Chad [name] Date of Service: 03/06/2022 9:45 AM Medical Record Number: [id number] Patient Account Number: [id number] Date of Birth/Sex: 10-09-1960 (61 y.o. M) Treating RN: Cornell Barman Primary Care Lestat Golob: [name] Other Clinician: [name] Referring Valentin Benney: [name] Treating Danikah Budzik/Extender: Jeri Cos Weeks in Treatment: 12 Wound Status Wound Number: 12 Primary Venous Leg Ulcer Etiology: Wound Location: Left, Distal, Medial Ankle Wound Open Wounding Event: Gradually Appeared Status: Date Acquired: 07/12/2019 Comorbid Anemia, Chronic Obstructive Pulmonary Disease (COPD), Weeks Of Treatment: 12 History: Coronary Artery Disease, Peripheral Arterial Disease, Clustered Wound: No Peripheral Venous Disease, Hepatitis C, Osteoarthritis, Neuropathy, Received Chemotherapy Photos Wound Measurements Length: (cm) 2.3 Width: (cm) 1.5 Depth: (cm) 0.2 Area: (cm) 2.71 Volume: (cm) 0.542 % Reduction in Area: 60.8% % Reduction in Volume: 60.8% Epithelialization: Small (1-33%) Wound Description Classification: Full Thickness Without Exposed Support Structu Exudate Amount: Small Exudate Type: Serosanguineous Exudate Color: red, brown res Foul Odor After Cleansing: No Slough/Fibrino Yes Wound Bed Granulation Amount: Medium (34-66%) Exposed Structure Granulation Quality: Pink Fascia Exposed: No Necrotic Amount: Medium (34-66%) Fat Layer (Subcutaneous Tissue) Exposed: Yes Necrotic Quality: Adherent Slough Tendon Exposed: No Muscle Exposed: No Joint Exposed: No Bone Exposed: No Treatment Notes Wound #12 (Ankle) Wound Laterality:  Left, Medial, Distal Cleanser Soap and Water Discharge Instruction: Gently cleanse wound with antibacterial soap,  rinse and pat dry prior to dressing wounds Wound Cleanser Chad [name], Chad [name] ([id number]) Discharge Instruction: Wash your hands with soap and water. Remove old dressing, discard into plastic bag and place into trash. Cleanse the wound with Wound Cleanser prior to applying a clean dressing using gauze sponges, not tissues or cotton balls. Do not scrub or use excessive force. Pat dry using gauze sponges, not tissue or cotton balls. Peri-Wound Care Desitin Maximum Strength Ointment 4 (oz) Discharge Instruction: Apply around wound edges to avoid maceration Topical Gentamicin Discharge Instruction: Apply as directed by . Primary Dressing Hydrofera Blue Ready Transfer Foam, 2.5x2.5 (in/in) Discharge Instruction: Apply Hydrofera Blue Ready to wound bed as directed Secondary Dressing ABD Pad 5x9 (in/in) Discharge Instruction: Cover with ABD pad Secured With Medipore Tape - 36M Medipore H Soft Cloth Surgical Tape, 2x2 (in/yd) Kerlix Roll Sterile or Non-Sterile 6-ply 4.5x4 (yd/yd) Discharge Instruction: Apply Kerlix as directed Tubigrip Size C, 2.75x10 (in/yd) Discharge Instruction: Apply 3 Tubigrip C 3-finger-widths below knee to base of toes to secure dressing and/or for swelling. This can be hand washed and air dried Compression Wrap Compression Stockings Add-Ons Electronic Signature(s) Signed: 03/06/2022 11:26:52 AM By: Gretta Cool, BSN, RN, CWS, Kim RN, BSN Signed: 03/07/2022 4:28:06 PM By: [name] Entered By: [name] on 03/06/2022 09:55:20 Chad [name] ([id number]) -------------------------------------------------------------------------------- Vitals Details Patient Name: Chad [name] Date of Service: 03/06/2022 9:45 AM Medical Record Number: [id number] Patient Account Number: [id number] Date of Birth/Sex: [birth year] (61 y.o. M) Treating RN: Cornell Barman Primary Care : [name] Other Clinician: [name] Referring : [name] Treating /Extender: Skipper Cliche in Treatment: 12 Vital Signs Time Taken: 09:47 Temperature (F): 98.2 Height (in): 67 Pulse (bpm): 78 Weight (lbs): 150 Respiratory Rate (breaths/min): 18 Body Mass Index (BMI): 23.5 Blood Pressure (mmHg): 133/87 Reference Range: 80 - 120 mg / dl Electronic Signature(s) Signed: 03/07/2022 4:28:06 PM By: [name] Entered By: [name] on 03/06/2022 09:49:14

## 2022-03-17 ENCOUNTER — Inpatient Hospital Stay: Payer: Medicaid Other | Attending: Internal Medicine

## 2022-03-17 ENCOUNTER — Inpatient Hospital Stay: Payer: Medicaid Other

## 2022-03-17 ENCOUNTER — Other Ambulatory Visit: Payer: Self-pay

## 2022-03-17 ENCOUNTER — Inpatient Hospital Stay (HOSPITAL_BASED_OUTPATIENT_CLINIC_OR_DEPARTMENT_OTHER): Payer: Medicaid Other | Admitting: Nurse Practitioner

## 2022-03-17 ENCOUNTER — Other Ambulatory Visit: Payer: Self-pay | Admitting: Internal Medicine

## 2022-03-17 VITALS — BP 95/65 | HR 83 | Temp 98.5°F | Resp 18 | Wt 159.0 lb

## 2022-03-17 VITALS — BP 106/72 | HR 78 | Resp 18

## 2022-03-17 DIAGNOSIS — Z79899 Other long term (current) drug therapy: Secondary | ICD-10-CM | POA: Diagnosis not present

## 2022-03-17 DIAGNOSIS — Z8051 Family history of malignant neoplasm of kidney: Secondary | ICD-10-CM | POA: Insufficient documentation

## 2022-03-17 DIAGNOSIS — C661 Malignant neoplasm of right ureter: Secondary | ICD-10-CM

## 2022-03-17 DIAGNOSIS — D509 Iron deficiency anemia, unspecified: Secondary | ICD-10-CM | POA: Diagnosis not present

## 2022-03-17 DIAGNOSIS — D500 Iron deficiency anemia secondary to blood loss (chronic): Secondary | ICD-10-CM

## 2022-03-17 DIAGNOSIS — Z5112 Encounter for antineoplastic immunotherapy: Secondary | ICD-10-CM | POA: Diagnosis present

## 2022-03-17 DIAGNOSIS — F17210 Nicotine dependence, cigarettes, uncomplicated: Secondary | ICD-10-CM | POA: Diagnosis not present

## 2022-03-17 DIAGNOSIS — C187 Malignant neoplasm of sigmoid colon: Secondary | ICD-10-CM | POA: Insufficient documentation

## 2022-03-17 DIAGNOSIS — Z8042 Family history of malignant neoplasm of prostate: Secondary | ICD-10-CM | POA: Insufficient documentation

## 2022-03-17 DIAGNOSIS — Z806 Family history of leukemia: Secondary | ICD-10-CM | POA: Diagnosis not present

## 2022-03-17 DIAGNOSIS — E032 Hypothyroidism due to medicaments and other exogenous substances: Secondary | ICD-10-CM

## 2022-03-17 DIAGNOSIS — Z7189 Other specified counseling: Secondary | ICD-10-CM

## 2022-03-17 DIAGNOSIS — E039 Hypothyroidism, unspecified: Secondary | ICD-10-CM

## 2022-03-17 DIAGNOSIS — Z515 Encounter for palliative care: Secondary | ICD-10-CM

## 2022-03-17 LAB — CBC WITH DIFFERENTIAL/PLATELET
Abs Immature Granulocytes: 0.02 10*3/uL (ref 0.00–0.07)
Basophils Absolute: 0.1 10*3/uL (ref 0.0–0.1)
Basophils Relative: 1 %
Eosinophils Absolute: 0.4 10*3/uL (ref 0.0–0.5)
Eosinophils Relative: 6 %
HCT: 34.5 % — ABNORMAL LOW (ref 39.0–52.0)
Hemoglobin: 10.9 g/dL — ABNORMAL LOW (ref 13.0–17.0)
Immature Granulocytes: 0 %
Lymphocytes Relative: 22 %
Lymphs Abs: 1.3 10*3/uL (ref 0.7–4.0)
MCH: 25.6 pg — ABNORMAL LOW (ref 26.0–34.0)
MCHC: 31.6 g/dL (ref 30.0–36.0)
MCV: 81.2 fL (ref 80.0–100.0)
Monocytes Absolute: 0.5 10*3/uL (ref 0.1–1.0)
Monocytes Relative: 8 %
Neutro Abs: 3.7 10*3/uL (ref 1.7–7.7)
Neutrophils Relative %: 63 %
Platelets: 175 10*3/uL (ref 150–400)
RBC: 4.25 MIL/uL (ref 4.22–5.81)
RDW: 14.6 % (ref 11.5–15.5)
WBC: 5.9 10*3/uL (ref 4.0–10.5)
nRBC: 0 % (ref 0.0–0.2)

## 2022-03-17 LAB — COMPREHENSIVE METABOLIC PANEL
ALT: 18 U/L (ref 0–44)
AST: 32 U/L (ref 15–41)
Albumin: 3.4 g/dL — ABNORMAL LOW (ref 3.5–5.0)
Alkaline Phosphatase: 109 U/L (ref 38–126)
Anion gap: 6 (ref 5–15)
BUN: 19 mg/dL (ref 8–23)
CO2: 24 mmol/L (ref 22–32)
Calcium: 8.2 mg/dL — ABNORMAL LOW (ref 8.9–10.3)
Chloride: 107 mmol/L (ref 98–111)
Creatinine, Ser: 0.96 mg/dL (ref 0.61–1.24)
GFR, Estimated: >60 mL/min (ref >60)
Glucose, Bld: 114 mg/dL — ABNORMAL HIGH (ref 70–99)
Potassium: 3.8 mmol/L (ref 3.5–5.1)
Sodium: 137 mmol/L (ref 135–145)
Total Bilirubin: 0.4 mg/dL (ref 0.3–1.2)
Total Protein: 7.2 g/dL (ref 6.5–8.1)

## 2022-03-17 MED ORDER — LEVOTHYROXINE SODIUM 25 MCG PO TABS: 25.0000 ug | ORAL_TABLET | Freq: Every day | ORAL | 1 refills | Status: DC

## 2022-03-17 MED ORDER — CALCIUM CARBONATE ANTACID 600 MG PO CHEW: 1200.0000 mg | CHEWABLE_TABLET | Freq: Every day | ORAL | 3 refills | Status: DC

## 2022-03-17 MED ORDER — HEPARIN SOD (PORK) LOCK FLUSH 100 UNIT/ML IV SOLN
500.0000 [IU] | Freq: Once | INTRAVENOUS | Status: AC | PRN
  Administered 2022-03-17: 500 [IU]
  Filled 2022-03-17: qty 5

## 2022-03-17 MED ORDER — SODIUM CHLORIDE 0.9 % IV SOLN
200.0000 mg | Freq: Once | INTRAVENOUS | Status: AC
  Administered 2022-03-17: 200 mg via INTRAVENOUS
  Filled 2022-03-17: qty 8

## 2022-03-17 MED ORDER — VITAMIN D3 25 MCG (1000 UNIT) PO TABS: 1000.0000 [IU] | ORAL_TABLET | Freq: Every day | ORAL | 3 refills | Status: DC

## 2022-03-17 MED ORDER — SODIUM CHLORIDE 0.9 % IV SOLN
Freq: Once | INTRAVENOUS | Status: AC
  Filled 2022-03-17: qty 250

## 2022-03-17 MED ORDER — SODIUM CHLORIDE 0.9 % IV SOLN
200.0000 mg | Freq: Once | INTRAVENOUS | Status: AC
  Administered 2022-03-17: 200 mg via INTRAVENOUS
  Filled 2022-03-17: qty 200

## 2022-03-17 NOTE — Progress Notes (Signed)
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 Is alone/in a wheelchair.  Chad [name] 61 y.o. male above history of stage IV-ureteral cancer/history of stage III colon cancer right side; recurrent sigmoid colon cancer [un-resected/under surveillance] and multiple other comorbidities currently on Keytruda is here for follow-up and consideration of keytruda and iron.   Denies any worsening abdominal pain.  Any nausea vomiting or constipation.  No blood in stools or black-colored stools.  No hematuria.  Chronic shortness of breath. Denies other complaints.    Review of Systems   Constitutional:  Positive for malaise/fatigue. Negative for chills, diaphoresis, fever and weight loss.  HENT:  Negative for nosebleeds and sore throat.   Eyes:  Negative for double vision.  Respiratory:  Positive for cough and shortness of breath. Negative for hemoptysis and wheezing.   Cardiovascular:  Negative for chest pain, palpitations and orthopnea.  Gastrointestinal:  Positive for constipation. Negative for abdominal pain, blood in stool, diarrhea, heartburn, melena, nausea and vomiting.  Genitourinary:  Negative for dysuria, frequency and urgency.  Musculoskeletal:  Positive for back pain and joint pain.  Skin: Negative.  Negative for itching and rash.  Neurological:  Positive for focal weakness. Negative for dizziness, tingling, weakness and headaches.       Chronic left-sided weakness upper than lower extremity.  Endo/Heme/Allergies:  Does not bruise/bleed easily.  Psychiatric/Behavioral:  Negative for depression. The patient is not nervous/anxious and does not have insomnia.      MEDICAL HISTORY:  Past Medical History:  Diagnosis Date   Anemia    Anxiety    ARF (acute respiratory failure) (HCC)    Bladder cancer (HCC)    COPD (chronic obstructive pulmonary disease) ([place])    Depression    Dysphagia    Family history of colon cancer    Family history of kidney cancer    Family history of leukemia    Family history of prostate cancer    GERD (gastroesophageal reflux disease)    Hepatitis    chronic hep c   Hydronephrosis    Hydronephrosis with ureteral stricture    Hyperlipidemia    Knee pain    Left   Malignant neoplasm of colon (HCC)    Nerve pain  Peripheral vascular disease ([place])    Prostate cancer ([place])    Stroke [facility])    Ureteral cancer, right ([place])    Urinary frequency    Venous hypertension of both lower extremities     SURGICAL HISTORY: Past Surgical History:  Procedure Laterality Date   COLON SURGERY     En bloc extended right hemicolectomy  07/2017   COLONOSCOPY WITH PROPOFOL N/A 11/06/2020   Procedure: COLONOSCOPY WITH PROPOFOL;  Surgeon: Jonathon Bellows, MD;  Location[facility] ENDOSCOPY;  Service: Gastroenterology;  Laterality: N/A;   COLONOSCOPY WITH PROPOFOL N/A 07/31/2021   Procedure: COLONOSCOPY WITH PROPOFOL;  Surgeon: Pietro Bellow, MD;  Location: [facility] ENDOSCOPY;  Service: Endoscopy;  Laterality: N/A;   CYSTOSCOPY W/ RETROGRADES Right 08/30/2018   Procedure: CYSTOSCOPY WITH RETROGRADE PYELOGRAM;  Surgeon: Hollice Espy, MD;  Location: [facility] ORS;  Service: Urology;  Laterality: Right;   CYSTOSCOPY WITH STENT PLACEMENT Right 04/25/2018   Procedure: CYSTOSCOPY WITH STENT PLACEMENT;  Surgeon: Hollice Espy, MD;  Location: [facility] ORS;  Service: Urology;  Laterality: Right;   CYSTOSCOPY WITH STENT PLACEMENT Right 08/30/2018   Procedure: [place];  Surgeon: Hollice Espy, MD;  Location: [facility] ORS;  Service: Urology;  Laterality: Right;   CYSTOSCOPY WITH STENT PLACEMENT Right 03/07/2019   Procedure: CYSTOSCOPY WITH STENT Exchange;  Surgeon: Hollice Espy, MD;  Location: [facility] ORS;  Service: Urology;  Laterality: Right;   CYSTOSCOPY WITH STENT PLACEMENT Right 11/21/2019   Procedure: CYSTOSCOPY WITH STENT Exchange;  Surgeon: Hollice Espy, MD;  Location: [facility] ORS;  Service: Urology;  Laterality: Right;   LOWER EXTREMITY ANGIOGRAPHY Left 05/23/2019   Procedure: LOWER EXTREMITY ANGIOGRAPHY;  Surgeon: Algernon Huxley, MD;  Location: [place] CV LAB;  Service: Cardiovascular;  Laterality: Left;   LOWER EXTREMITY ANGIOGRAPHY Right 05/30/2019   Procedure: LOWER EXTREMITY ANGIOGRAPHY;  Surgeon: Algernon Huxley, MD;  Location: [place] CV LAB;  Service: Cardiovascular;  Laterality: Right;   LOWER EXTREMITY ANGIOGRAPHY Right 02/13/2020   Procedure: LOWER EXTREMITY ANGIOGRAPHY;  Surgeon: Algernon Huxley, MD;  Location: [place] CV LAB;  Service: Cardiovascular;  Laterality: Right;   LOWER EXTREMITY ANGIOGRAPHY Left 02/20/2020    Procedure: LOWER EXTREMITY ANGIOGRAPHY;  Surgeon: Algernon Huxley, MD;  Location: [place] CV LAB;  Service: Cardiovascular;  Laterality: Left;   PORTA CATH INSERTION N/A 02/28/2019   Procedure: PORTA CATH INSERTION;  Surgeon: Algernon Huxley, MD;  Location: [place] CV LAB;  Service: Cardiovascular;  Laterality: N/A;   tumor removed       SOCIAL HISTORY: Social History   Socioeconomic History   Marital status: Single    Spouse name: Not on file   Number of children: Not on file   Years of education: Not on file   Highest education level: Not on file  Occupational History   Not on file  Tobacco Use   Smoking status: Every Day    Packs/day: 1.00    Types: Cigarettes   Smokeless tobacco: Never  Vaping Use   Vaping Use: Never used  Substance and Sexual Activity   Alcohol use: Not Currently   Drug use: Not Currently   Sexual activity: Not Currently  Other Topics Concern   Not on file  Social History Narrative    used to live [place]; moved  To [place]- end of April 2019; in Nursing home; 1pp/day; quit alcohol. 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        Family- dad- prostate ca [at 49y]; brother-  77 died of prostate cancer; brother- 73- no cancers [[place]]; sonGerald Stabs [[place]];[name]-32y prostate ca [facility] [place]]; daughter- 69 [[place]]; another daughter 17 [[place]/addict]. will refer genetics counseling. Given MSI- abnormal; highly suspicious of Lynch syndrome.  Patient's son [name] aware of high possible lynch syndrome.   Social Determinants of Health   Financial Resource Strain: Not on file  Food Insecurity: Not on file  Transportation Needs: Not on file  Physical Activity: Not on file  Stress: Not on file  Social Connections: Not on file  Intimate Partner Violence: Not on file    FAMILY HISTORY: Family History  Problem Relation Age of Onset   Prostate cancer Father 30   Cancer Brother 29       unsure type   Cancer Paternal Uncle        unsure type   Cancer Maternal Grandmother         unsure type   Cancer Paternal Grandmother        unsure type   Kidney cancer Paternal Grandfather    Cancer Other        unsure types   Leukemia Son    Cancer Son        other cancers, possibly colon    ALLERGIES:  is allergic to penicillins.  MEDICATIONS:  Current Outpatient Medications  Medication Sig Dispense Refill   acetaminophen (TYLENOL) 325 MG tablet Take 650 mg by mouth 3 (three) times daily.      aspirin EC 81 MG tablet Take 1 tablet (81 mg total) by mouth daily. 150 tablet 2   atorvastatin (LIPITOR) 10 MG tablet Take 1 tablet (10 mg total) by mouth daily. 30 tablet 11   Calcium Carbonate Antacid 600 MG chewable tablet Chew 2 tablets (1,200 mg total) by mouth daily. 90 tablet 3   carboxymethylcellulose 1 % ophthalmic solution Apply 1 drop to eye 2 (two) times daily.     cholecalciferol (VITAMIN D3) 25 MCG (1000 UNIT) tablet Take 1 tablet (1,000 Units total) by mouth daily. 1 tablet 3   clopidogrel (PLAVIX) 75 MG tablet Take 1 tablet (75 mg total) by mouth daily. 30 tablet 11   cyclobenzaprine (FLEXERIL) 10 MG tablet Take 10 mg by mouth at bedtime.     escitalopram (LEXAPRO) 5 MG tablet Take 5 mg by mouth daily.     gabapentin (NEURONTIN) 100 MG capsule Take 100 mg by mouth 3 (three) times daily.     gentamicin ointment (GARAMYCIN) 0.1 % Apply 1 application. topically 3 (three) times daily.     levothyroxine (SYNTHROID) 25 MCG tablet Take 1 tablet (25 mcg total) by mouth daily before breakfast. 30 tablet 1   levothyroxine (SYNTHROID) 25 MCG tablet Take 1 tablet (25 mcg total) by mouth daily before breakfast. 30 tablet 1   mirtazapine (REMERON) 7.5 MG tablet Take 7.5 mg by mouth at bedtime.     Oxycodone HCl 10 MG TABS Take 10 mg by mouth every 6 (six) hours as needed for pain.     pantoprazole (PROTONIX) 40 MG tablet Take 40 mg by mouth daily.     senna-docusate (SENOKOT-S) 8.6-50 MG tablet Take 1 tablet by mouth daily.     tamsulosin (FLOMAX) 0.4 MG CAPS capsule  Take 1 capsule (0.4 mg total) by mouth daily after supper. 30 capsule 3   No current facility-administered medications for this visit.    PHYSICAL EXAMINATION: ECOG PERFORMANCE STATUS: 1 - Symptomatic but completely ambulatory  Vitals:   03/17/22 1317  BP: 95/65  Pulse: 83  Resp: 18  Temp: 98.5 F (36.9 C)   Filed Weights   03/17/22 1317  Weight: 159 lb (72.1 kg)    Physical Exam Constitutional:      General: He is not in acute distress.    Comments: unaccompanied  HENT:     Head: Normocephalic and atraumatic.     Mouth/Throat:     Pharynx: No oropharyngeal exudate.  Cardiovascular:     Rate and Rhythm: Normal rate and regular rhythm.  Pulmonary:     Effort: No respiratory distress.     Breath sounds: No wheezing.  Abdominal:     General: There is no distension.     Palpations: Abdomen is soft. There is no mass.     Tenderness: There is no abdominal tenderness. There is no guarding or rebound.  Musculoskeletal:        General: No tenderness or deformity.     Comments: wheelchair  Skin:    General: Skin is warm.     Findings: Lesion present.     Comments: Bilateral lower extremity ulcerations noted.  Pulses intact bilaterally.  Cystlike lesion noted right elbow-nontender.  No erythema noted.  Neurological:     Mental Status: He is alert and oriented to person, place, and time.     Comments: Chronic weakness left upper and lower extremity.  Psychiatric:        Mood and Affect: Mood and affect normal.        Behavior: Behavior normal.      LABORATORY DATA:  I have reviewed the data as listed Lab Results  Component Value Date   WBC 5.9 03/17/2022   HGB 10.9 (L) 03/17/2022   HCT 34.5 (L) 03/17/2022   MCV 81.2 03/17/2022   PLT 175 03/17/2022   Recent Labs    02/03/22 0850 02/24/22 1231 03/17/22 1247  NA 136 137 137  K 3.7 4.1 3.8  CL 104 106 107  CO2 $Re'23 26 24  'RiA$ GLUCOSE 124* 100* 114*  BUN $Re'22 21 19  'uPY$ CREATININE 1.02 1.00 0.96  CALCIUM 8.4* 8.3*  8.2*  GFRNONAA >60 >60 >60  PROT 7.1 7.1 7.2  ALBUMIN 3.3* 3.3* 3.4*  AST 76* 39 32  ALT 44 23 18  ALKPHOS 114 117 109  BILITOT 0.5 0.3 0.4   Lab Results  Component Value Date   TSH 4.683 (H) 02/03/2022     RADIOGRAPHIC STUDIES: I have personally reviewed the radiological images as listed and agreed with the findings in the report. No results found.  ASSESSMENT & PLAN:   No problem-specific Assessment & Plan notes found for this encounter.  Ureteral cancer, right ([place]) # High-grade urothelial cancer/cytology; likely of the right renal pelvis /upper ureter. On keytruda-CT scan- JUNE 30th, 2023- Stable a thickening of the right ureteral area- no obvious progression of disease noted on imaging. STABLE.   # Proceed with [place] today; Labs today reviewed; Labs today reviewed;  acceptable for treatment today. STABLE. TSH April 2023--WNL.  TSH July 2023 elevated 4.683. Likely iatrogenic etiology in setting of [place]. He was started levothyroxine 25 mcg daily at last visit but has not been receiving medication at facility. Awaiting today's results. Copy of last TSH sent to facility.    # Sigmoid colon cancer-[under surveillance] [history of Lynch syndrome]- JAN 2023- S/p colonoscopy -poor prep/multiple failed attempts.  JUNE 2023 CT scan- Focal wall thickening in the proximal sigmoid colon; pericolonic lymph nodes overall stable.  Poor candidate for surgery; s/p evaluation with  Dr. Bary Castilla. STABLE.    # Iron deficiency anemia-hemoglobin 11-12; FEB 2023- iron studies-7/ferritin-18 LOW; proceed with venofer. Reviewed risks and benefits of IV iron including allergic reaction. He's Tolerated venofer well. Plan for venofer today which will be his second dose. Plan for venofer next cycle as well.    # Hypocalcemia- Mild- Has not received calcium or D at facility. Start calcium 1200 mg and vitamin d 1000 iu daily. Copy of cmp results provided to facility today.     # DISPOSITION: # Keytruda  and iron today.  # follow up in 3 weeks- port/lab (cbc, cmp), Ebbie Latus & venofer- la   All questions were answered. The patient knows to call the clinic with any problems, questions or concerns.   Verlon Au, NP 03/17/2022

## 2022-03-17 NOTE — Patient Instructions (Signed)
[facility] CANCER CTR AT [facility]  Discharge Instructions: Thank you for choosing [facility] to provide your oncology and hematology care.  If you have a lab appointment with the [facility], please go directly to the [facility] and check in at the registration area.  Wear comfortable clothing and clothing appropriate for easy access to any Portacath or PICC line.   We strive to give you quality time with your provider. You may need to reschedule your appointment if you arrive late (15 or more minutes).  Arriving late affects you and other patients whose appointments are after yours.  Also, if you miss three or more appointments without notifying the office, you may be dismissed from the clinic at the provider's discretion.      For prescription refill requests, have your pharmacy contact our office and allow 72 hours for refills to be completed.    Today you received the following chemotherapy and/or immunotherapy agents Keytruda, Venofer      To help prevent nausea and vomiting after your treatment, we encourage you to take your nausea medication as directed.  BELOW ARE SYMPTOMS THAT SHOULD BE REPORTED IMMEDIATELY: *FEVER GREATER THAN 100.4 F (38 C) OR HIGHER *CHILLS OR SWEATING *NAUSEA AND VOMITING THAT IS NOT CONTROLLED WITH YOUR NAUSEA MEDICATION *UNUSUAL SHORTNESS OF BREATH *UNUSUAL BRUISING OR BLEEDING *URINARY PROBLEMS (pain or burning when urinating, or frequent urination) *BOWEL PROBLEMS (unusual diarrhea, constipation, pain near the anus) TENDERNESS IN MOUTH AND THROAT WITH OR WITHOUT PRESENCE OF ULCERS (sore throat, sores in mouth, or a toothache) UNUSUAL RASH, SWELLING OR PAIN  UNUSUAL VAGINAL DISCHARGE OR ITCHING   Items with * indicate a potential emergency and should be followed up as soon as possible or go to the Emergency Department if any problems should occur.  Please show the CHEMOTHERAPY ALERT CARD or IMMUNOTHERAPY ALERT CARD at  check-in to the Emergency Department and triage nurse.  Should you have questions after your visit or need to cancel or reschedule your appointment, please contact [facility] CANCER [place] AT [place]  [phone] and follow the prompts.  Office hours are 8:00 a.m. to 4:30 p.m. Monday - Friday. Please note that voicemails left after 4:00 p.m. may not be returned until the following business day.  We are closed weekends and major holidays. You have access to a nurse at all times for urgent questions. Please call the main number to the clinic [phone] and follow the prompts.  For any non-urgent questions, you may also contact your provider using MyChart. We now offer e-Visits for anyone 65 and older to request care online for non-urgent symptoms. For details visit [email or web address].   Also download the MyChart app! Go to the app store, search "MyChart", open the app, select [facility], and log in with your MyChart username and password.  Masks are optional in the cancer centers. If you would like for your care team to wear a mask while they are taking care of you, please let them know. For doctor visits, patients may have with them one support person who is at least 63 years old. At this time, visitors are not allowed in the infusion area.

## 2022-03-17 NOTE — Progress Notes (Signed)
Pt returns for follow-up. No new concerns at this time.

## 2022-03-18 LAB — THYROID PANEL WITH TSH
Free Thyroxine Index: 1.9 (ref 1.2–4.9)
T3 Uptake Ratio: 22 % — ABNORMAL LOW (ref 24–39)
T4, Total: 8.5 ug/dL (ref 4.5–12.0)
TSH: 1.72 u[IU]/mL (ref 0.450–4.500)

## 2022-03-19 ENCOUNTER — Telehealth: Payer: Self-pay | Admitting: *Deleted

## 2022-03-19 NOTE — Telephone Encounter (Signed)
Spoke with patient's nurse at [facility]. Hand off provided to RN.  Nurse requested order to faxed to [phone].

## 2022-03-19 NOTE — Telephone Encounter (Signed)
Received incoming message from [place], [place] to follow-up on pt care.  Per Ander Purpura, this patient does not have to take the Synthroid that was prescribed at his appointment on Monday. His TSH came back normal; however, it was elevated 1 mo ago and that's why she wanted it started. We did not have the TSH back before he left. I tried calling the nursing home yesterday several times, but they never answered. It's [facility] [phone].

## 2022-03-20 ENCOUNTER — Encounter: Payer: Self-pay | Admitting: Internal Medicine

## 2022-03-20 ENCOUNTER — Ambulatory Visit: Payer: Medicaid Other | Admitting: Physician Assistant

## 2022-03-21 ENCOUNTER — Ambulatory Visit: Payer: Medicaid Other | Admitting: Physician Assistant

## 2022-03-24 ENCOUNTER — Non-Acute Institutional Stay: Payer: Medicaid Other | Admitting: Nurse Practitioner

## 2022-03-24 ENCOUNTER — Encounter: Payer: Self-pay | Admitting: Nurse Practitioner

## 2022-03-24 VITALS — BP 130/70 | HR 70 | Temp 98.0°F | Resp 18 | Wt 158.6 lb

## 2022-03-24 DIAGNOSIS — C661 Malignant neoplasm of right ureter: Secondary | ICD-10-CM

## 2022-03-24 DIAGNOSIS — R630 Anorexia: Secondary | ICD-10-CM

## 2022-03-24 DIAGNOSIS — Z515 Encounter for palliative care: Secondary | ICD-10-CM

## 2022-03-24 DIAGNOSIS — G894 Chronic pain syndrome: Secondary | ICD-10-CM

## 2022-03-24 DIAGNOSIS — G4700 Insomnia, unspecified: Secondary | ICD-10-CM

## 2022-03-24 NOTE — Progress Notes (Signed)
[facility] Consult Note Telephone: [phone]  Fax: [phone]    Date of encounter: 03/24/22 2:46 PM PATIENT NAME: Chad [name] [id number]   [id number] (home)  DOB: 08-27-1960 MRN: [id number] PRIMARY CARE PROVIDER:    [facility]  RESPONSIBLE PARTY:    Contact Information     Name Relation Home Work Mobile   Chad [name], Chad [name]   [phone]         I met face to face with patient in facility. Palliative Care was asked to follow this patient by consultation request of  Chad [name] to address advance care planning and complex medical decision making. This is a follow up visit.                                  ASSESSMENT AND PLAN / RECOMMENDATIONS: Symptom Management/Plan: 1. 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  12/30/2021 weight 148.8 lbs 02/03/2022 weight 154.5 lbs 03/13/2022 weight 158.6 lbs   3. Chronic pain reviewed; reviewed chronic pain, medications reviewed; Chad [name] endorses manageable with current oxycodone, discussed and educated continue to monitor on pain scale, monitor efficacy vs adverse side effects.Will continue to monitor; encouraged to ask when in pain, Continue to follow by psychotherapy and psychiatry for counseling; supportive services.    4. Insomnia reviewed sleep patterns, discussed sleep hygiene. Received remeron 7.50m qhs improving;    5. Palliative care encounter; Palliative medicine team will continue to support patient, patient's family, and medical team. 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 Return 8 weeks or prn. I spent 37 minutes providing this consultation starting at 2:45 pm. More than 50% of the time in this consultation was spent in counseling and care coordination. PPS: 50% Chief Complaint: Follow up palliative consult for complex medical decision making HISTORY  OF PRESENT ILLNESS:  Chad [name] is a 61 y.o. year old male  with multiple medical problems including   late onset CVA, urothelial cancer, prostate cancer, chronic hepatitis C, hyperlipidemia, colon surgery, protein calorie malnutrition, anxiety, depression. Last Oncology visit 03/17/2022 noted CT scan 01/24/2022 stable thickening of right ureteral area, no obvious progression of disease noted on imaging for high grade urothelial cancer; on Keytruda; proceeded with Keytruda  infusion; received Venofer at this visit; calcium started with vitamin D and levothyroxine 48mg was started; [name]. Chad [name] himself to w/c, requires assistance for ADL's. Chad [name] himself with fair to declined appetite overall. I visited and observed [name] in high back w/c in his room. We talked about how he has been feeling, which [name] "feeling better". We talked about mobility, doing more in the gym with therapy. We talked about ros, appetite, nutrition. We talked about chronic cough with intermit sob with smoking, declined smoking cessation. We talked about medical goals. We re-visited importance of sleep, discussed sleep patterns, sleep hygiene.  We talked about role pc in poc. Chad [name] for supportive pc visit today. Chad [name] understanding. We talked about role PC in poc. Updated staff. No changes.    History obtained from review of EMR, discussion with staff and Chad [name]  I reviewed available labs, medications, imaging, studies and related documents from the EMR.  Records reviewed and summarized above.    ROS 10 point system reviewed all negative except HPI   Exam Constitutional: NAD General: pleasant male EYES: lids intact ENMT: oral mucous membranes moist CV: S1S2, RRR Pulmonary: breath sounds clear Abdomen: soft and non tender MSK: w/d dependent, +Edema BLE Skin: warm and dry Neuro:  left hemiplegia,  no cognitive impairment Psych: non-anxious affect, A and O x 3  Thank you for the opportunity to participate in the care of Chad [name]  The palliative care team will continue to follow. Please call our office at [phone] we can be of additional assistance.   Arthuro Canelo ZIhor Gully NP

## 2022-03-27 ENCOUNTER — Encounter: Payer: Medicaid Other | Admitting: Physician Assistant

## 2022-03-27 DIAGNOSIS — L97322 Non-pressure chronic ulcer of left ankle with fat layer exposed: Secondary | ICD-10-CM | POA: Diagnosis not present

## 2022-03-27 NOTE — Progress Notes (Signed)
Chad [name], Chad [name] ([id number]) Visit Report for 03/27/2022 Arrival Information Details Patient Name: Chad [name], Chad [name] Date of Service: 03/27/2022 3:15 PM Medical Record Number: [id number] Patient Account Number: [id number] Date of Birth/Sex: 12/06/1960 (61 y.o. M) Treating RN: Breedlove, Lauren Primary Care : Lawson, Healy Other Clinician: Referring : Lawson, Healy Treating /Extender: Stone, Hoyt Weeks in Treatment: 15 Visit Information History Since Last Visit Added or deleted any medications: No Patient Arrived: Wheel Chair Any new allergies or adverse reactions: No Arrival Time: 15:34 Had a fall or experienced change in No Accompanied By: self activities of daily living that may affect Transfer Assistance: Manual risk of falls: Patient Identification Verified: Yes Signs or symptoms of abuse/neglect since last visito No Secondary Verification Process Completed: Yes Hospitalized since last visit: No Patient Requires Transmission-Based No Implantable device outside of the clinic excluding No Precautions: cellular tissue based products placed in the center Patient Has Alerts: Yes since last visit: Patient Alerts: Patient on Blood Thinner Has Dressing in Place as Prescribed: Yes NOT diabetic Pain Present Now: No aspirin 81mg [facility] Electronic Signature(s) Signed: 03/27/2022 4:20:12 PM By: Breedlove, Lauren RN Entered By: Breedlove, Lauren on 03/27/2022 15:34:58 Desrocher, Keefer ([id number]) -------------------------------------------------------------------------------- Encounter Discharge Information Details Patient Name: Chad [name], Chad [name] Date of Service: 03/27/2022 3:15 PM Medical Record Number: [id number] Patient Account Number: [id number] Date of Birth/Sex: 07/22/1961 (61 y.o. M) Treating RN: Breedlove, Lauren Primary Care : Lawson, Healy Other Clinician: Referring : Lawson, Healy Treating /Extender: Stone,  Hoyt Weeks in Treatment: 15 Encounter Discharge Information Items Post Procedure Vitals Discharge Condition: Stable Temperature (F): 98.7 Ambulatory Status: Wheelchair Pulse (bpm): 74 Discharge Destination: Home Respiratory Rate (breaths/min): 17 Transportation: Private Auto Blood Pressure (mmHg): 133/70 Accompanied By: self Schedule Follow-up Appointment: Yes Clinical Summary of Care: Patient Declined Electronic Signature(s) Signed: 03/27/2022 4:20:12 PM By: Breedlove, Lauren RN Entered By: Breedlove, Lauren on 03/27/2022 16:17:13 Lemen, Brion ([id number]) -------------------------------------------------------------------------------- Lower Extremity Assessment Details Patient Name: Chad [name], Chad [name] Date of Service: 03/27/2022 3:15 PM Medical Record Number: [id number] Patient Account Number: [id number] Date of Birth/Sex: 01/27/1961 (61 y.o. M) Treating RN: Breedlove, Lauren Primary Care : Lawson, Healy Other Clinician: Referring : Lawson, Healy Treating /Extender: Stone, Hoyt Weeks in Treatment: 15 Edema Assessment Assessed: [Left: Yes] [Right: No] Edema: [Left: Ye] [Right: s] Calf Left: Right: Point of Measurement: From Medial Instep 33.7 cm Ankle Left: Right: Point of Measurement: From Medial Instep 22 cm Vascular Assessment Pulses: Dorsalis Pedis Palpable: [Left:Yes] Posterior Tibial Palpable: [Left:Yes] Electronic Signature(s) Signed: 03/27/2022 4:20:12 PM By: Breedlove, Lauren RN Entered By: Breedlove, Lauren on 03/27/2022 15:38:27 Netto, Ahren ([id number]) -------------------------------------------------------------------------------- Multi-Disciplinary Care Plan Details Patient Name: Chad [name], Chad [name] Date of Service: 03/27/2022 3:15 PM Medical Record Number: [id number] Patient Account Number: [id number] Date of Birth/Sex: 06/07/1961 (61 y.o. M) Treating RN: Breedlove, Lauren Primary Care : Lawson, Healy Other  Clinician: Referring : Lawson, Healy Treating /Extender: Stone, Hoyt Weeks in Treatment: 15 Active Inactive Necrotic Tissue Nursing Diagnoses: Impaired tissue integrity related to necrotic/devitalized tissue Knowledge deficit related to management of necrotic/devitalized tissue Goals: Necrotic/devitalized tissue will be minimized in the wound bed Date Initiated: 02/07/2022 Target Resolution Date: 03/29/2022 Goal Status: Active Patient/caregiver will verbalize understanding of reason and process for debridement of necrotic tissue Date Initiated: 02/07/2022 Target Resolution Date: 03/29/2022 Goal Status: Active Interventions: Assess patient pain level pre-, during and post procedure and prior to discharge Provide education on necrotic tissue and debridement process Treatment Activities: Excisional debridement : 02/07/2022 Notes: Venous Leg Ulcer Nursing Diagnoses: Knowledge deficit related to disease process   and management Goals: Patient will maintain optimal edema control Date Initiated: 12/06/2021 Date Inactivated: 12/27/2021 Target Resolution Date: 01/03/2022 Goal Status: Met Patient/caregiver will verbalize understanding of disease process and disease management Date Initiated: 12/06/2021 Target Resolution Date: 03/29/2022 Goal Status: Active Interventions: Assess peripheral edema status every visit. Compression as ordered Notes: Electronic Signature(s) Signed: 03/27/2022 4:20:12 PM By: Rhae Hammock RN Entered By: Rhae Hammock on 03/27/2022 16:09:26 Chad [name] ([id number]) -------------------------------------------------------------------------------- Pain Assessment Details Patient Name: Chad [name] Date of Service: 03/27/2022 3:15 PM Medical Record Number: [id number] Patient Account Number: [id number] Date of Birth/Sex: February 07, 1961 (61 y.o. M) Treating RN: Rhae Hammock Primary Care Marquese Burkland: [name] Other Clinician: Referring  Ausar Georgiou: [name] Treating Shristi Scheib/Extender: Skipper Cliche in Treatment: 15 Active Problems Location of Pain Severity and Description of Pain Patient Has Paino No Site Locations Pain Management and Medication Current Pain Management: Electronic Signature(s) Signed: 03/27/2022 4:20:12 PM By: Rhae Hammock RN Entered By: Rhae Hammock on 03/27/2022 15:35:53 Chad [name] ([id number]) -------------------------------------------------------------------------------- Patient/Caregiver Education Details Patient Name: Chad [name] Date of Service: 03/27/2022 3:15 PM Medical Record Number: [id number] Patient Account Number: [id number] Date of Birth/Gender: Apr 13, 1961 (61 y.o. M) Treating RN: Rhae Hammock Primary Care Physician: [name] Other Clinician: Referring Physician: [name] Treating Physician/Extender: Skipper Cliche in Treatment: 15 Education Assessment Education Provided To: Patient Education Topics Provided Wound/Skin Impairment: Methods: Explain/Verbal Responses: State content correctly Electronic Signature(s) Signed: 03/27/2022 4:20:12 PM By: Rhae Hammock RN Entered By: Rhae Hammock on 03/27/2022 16:15:54 Chad [name] ([id number]) -------------------------------------------------------------------------------- Wound Assessment Details Patient Name: Chad [name] Date of Service: 03/27/2022 3:15 PM Medical Record Number: [id number] Patient Account Number: [id number] Date of Birth/Sex: [birth year] (61 y.o. M) Treating RN: Rhae Hammock Primary Care Tanishi Nault: [name] Other Clinician: Referring Lendon George: [name] Treating Sissi Padia/Extender: Skipper Cliche in Treatment: 15 Wound Status Wound Number: 12 Primary Venous Leg Ulcer Etiology: Wound Location: Left, Distal, Medial Ankle Wound Open Wounding Event: Gradually Appeared Status: Date Acquired: 07/12/2019 Comorbid Anemia, Chronic Obstructive  Pulmonary Disease (COPD), Weeks Of Treatment: 15 History: Coronary Artery Disease, Peripheral Arterial Disease, Clustered Wound: No Peripheral Venous Disease, Hepatitis C, Osteoarthritis, Neuropathy, Received Chemotherapy Photos Wound Measurements Length: (cm) 2.5 Width: (cm) 1 Depth: (cm) 0.2 Area: (cm) 1.963 Volume: (cm) 0.393 % Reduction in Area: 71.6% % Reduction in Volume: 71.6% Epithelialization: Medium (34-66%) Tunneling: No Undermining: No Wound Description Classification: Full Thickness Without Exposed Support Structu Wound Margin: Distinct, outline attached Exudate Amount: Medium Exudate Type: Serosanguineous Exudate Color: red, brown res Foul Odor After Cleansing: No Slough/Fibrino Yes Wound Bed Granulation Amount: Large (67-100%) Exposed Structure Granulation Quality: Pink Fascia Exposed: No Necrotic Amount: Small (1-33%) Fat Layer (Subcutaneous Tissue) Exposed: Yes Necrotic Quality: Adherent Slough Tendon Exposed: No Muscle Exposed: No Joint Exposed: No Bone Exposed: No Treatment Notes Wound #12 (Ankle) Wound Laterality: Left, Medial, Distal Cleanser Soap and Water Discharge Instruction: Gently cleanse wound with antibacterial soap, rinse and pat dry prior to dressing wounds TAGGERT, BOZZI ([id number]) Wound Cleanser Discharge Instruction: Wash your hands with soap and water. Remove old dressing, discard into plastic bag and place into trash. Cleanse the wound with Wound Cleanser prior to applying a clean dressing using gauze sponges, not tissues or cotton balls. Do not scrub or use excessive force. Pat dry using gauze sponges, not tissue or cotton balls. Peri-Wound Care Desitin Maximum Strength Ointment 4 (oz) Discharge Instruction: Apply around wound edges to avoid maceration Topical Gentamicin Discharge Instruction: Apply as directed by Nitish Roes. Primary Dressing Hydrofera Blue Ready Transfer Foam, 2.5x2.5 (  in/in) Discharge Instruction: Apply  Hydrofera Blue Ready to wound bed as directed Secondary Dressing ABD Pad 5x9 (in/in) Discharge Instruction: Cover with ABD pad Secured With Medipore Tape - 3M Medipore H Soft Cloth Surgical Tape, 2x2 (in/yd) Kerlix Roll Sterile or Non-Sterile 6-ply 4.5x4 (yd/yd) Discharge Instruction: Apply Kerlix as directed Tubigrip Size C, 2.75x10 (in/yd) Discharge Instruction: Apply 3 Tubigrip C 3-finger-widths below knee to base of toes to secure dressing and/or for swelling. This can be hand washed and air dried Compression Wrap Compression Stockings Add-Ons Electronic Signature(s) Signed: 03/27/2022 4:20:12 PM By: Breedlove, Lauren RN Entered By: Breedlove, Lauren on 03/27/2022 15:43:04 Chad [name], Chad [name] ([id number]) -------------------------------------------------------------------------------- Vitals Details Patient Name: Chad [name], Chad [name] Date of Service: 03/27/2022 3:15 PM Medical Record Number: [id number] Patient Account Number: [id number] Date of Birth/Sex: 11/10/1960 (61 y.o. M) Treating RN: Breedlove, Lauren Primary Care : Lawson, Healy Other Clinician: Referring : Lawson, Healy Treating /Extender: Stone, Hoyt Weeks in Treatment: 15 Vital Signs Time Taken: 16:00 Temperature (°F): 98.7 Height (in): 67 Pulse (bpm): 74 Weight (lbs): 150 Respiratory Rate (breaths/min): 17 Body Mass Index (BMI): 23.5 Blood Pressure (mmHg): 134/74 Reference Range: 80 - 120 mg / dl Electronic Signature(s) Signed: 03/27/2022 4:20:12 PM By: Breedlove, Lauren RN Entered By: Breedlove, Lauren on 03/27/2022 16:09:05 

## 2022-03-28 NOTE — Progress Notes (Addendum)
[name], [name] ([id number]) Visit Report for 03/27/2022 Chief Complaint Document Details Patient Name: Chad [name], Chad [name] Date of Service: 03/27/2022 3:15 PM Medical Record Number: [id number] Patient Account Number: [id number] Date of Birth/Sex: Dec 03, 1960 (61 y.o. M) Treating RN: Rhae Hammock Primary Care Provider: [name] Other Clinician: Referring Provider: [name] Treating Provider/Extender: Skipper Cliche in Treatment: 15 Information Obtained from: Patient Chief Complaint Left ankle ulcers Electronic Signature(s) Signed: 03/27/2022 3:23:05 PM By: Worthy Keeler PA-C Entered By: Worthy Keeler on 03/27/2022 15:23:04 Chad [name] ([id number]) -------------------------------------------------------------------------------- Debridement Details Patient Name: Chad [name] Date of Service: 03/27/2022 3:15 PM Medical Record Number: [id number] Patient Account Number: [id number] Date of Birth/Sex: 12-Apr-1961 (61 y.o. M) Treating RN: Rhae Hammock Primary Care Provider: [name] Other Clinician: Referring Provider: [name] Treating Provider/Extender: Skipper Cliche in Treatment: 15 Debridement Performed for Wound #12 Left,Distal,Medial Ankle Assessment: Performed By: Physician Tommie Sams., PA-C Debridement Type: Debridement Severity of Tissue Pre Debridement: Fat layer exposed Level of Consciousness (Pre- Awake and Alert procedure): Pre-procedure Verification/Time Out Yes - 15:04 Taken: Start Time: 15:04 Pain Control: Lidocaine Total Area Debrided (L x W): 2.5 (cm) x 1 (cm) = 2.5 (cm) Tissue and other material Viable, Non-Viable, Skin: Dermis , Biofilm debrided: Level: Skin/Dermis Debridement Description: Selective/Open Wound Instrument: Curette Bleeding: Minimum Hemostasis Achieved: Pressure End Time: 15:04 Procedural Pain: 0 Post Procedural Pain: 0 Response to Treatment: Procedure was tolerated well Level of Consciousness  (Post- Awake and Alert procedure): Post Debridement Measurements of Total Wound Length: (cm) 2.5 Width: (cm) 1 Depth: (cm) 0.2 Volume: (cm) 0.393 Character of Wound/Ulcer Post Debridement: Improved Severity of Tissue Post Debridement: Fat layer exposed Post Procedure Diagnosis Same as Pre-procedure Electronic Signature(s) Signed: 03/27/2022 4:20:12 PM By: Rhae Hammock RN Signed: 03/27/2022 5:40:29 PM By: Worthy Keeler PA-C Entered By: Rhae Hammock on 03/27/2022 16:11:14 Chad [name] ([id number]) -------------------------------------------------------------------------------- HPI Details Patient Name: Chad [name] Date of Service: 03/27/2022 3:15 PM Medical Record Number: [id number] Patient Account Number: [id number] Date of Birth/Sex: [birth year] (61 y.o. M) Treating RN: Rhae Hammock Primary Care Provider: [name] Other Clinician: Referring Provider: [name] Treating Provider/Extender: Skipper Cliche in Treatment: 15 History of Present Illness HPI Description: 10/08/18 on evaluation today patient actually presents to our office for initial evaluation concerning wounds that he has of the bilateral lower extremities. 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 He also has bilateral venous stasis. He apparently has been homeless currently following discharge from the hospital apparently he has been placed at [facility] which is is a skilled nursing facility locally. Nonetheless fortunately he does not show any signs of infection at this time which is good news. In fact several of the wound actually appears to be showing some signs of improvement already in my pinion. There are a couple areas in the left leg in particular there likely gonna require some sharp debridement to help clear away  some necrotic tissue and help with more sufficient healing. No fevers, chills, nausea, or vomiting noted at this time. 10/15/18 on evaluation today patient actually appears to be doing very well in regard to his bilateral lower extremities. He's been tolerating the dressing changes without complication. Fortunately there does not appear to be any evidence of active infection at this time which is great news. Overall I'm actually very pleased with how this has progressed in just  one visits time. Readmission: 08/14/2020 upon evaluation today patient presents for re-evaluation here in our clinic. He is having issues with his left ankle region as well as his right toe and his right heel. He tells me that the toe and heel actually began as a area that was itching that he was scratching and then subsequently opened up into wounds. These may have been abscess areas I presume based on what I am seeing currently. With regard to his left ankle region he tells me this was a similar type occurrence although he does have venous stasis this very well may be more of a venous leg ulcer more than anything. Nonetheless I do believe that the patient would benefit from appropriate and aggressive wound care to try to help get things under better control here. He does have history of a stroke on the left side affecting him to some degree there that he is able to stand although he does have some residual weakness. Otherwise again the patient does have chronic venous insufficiency as previously noted. His arterial studies most recently obtained showed that he had an ABI on the right of 1.16 with a TBI of 0.52 and on the left and ABI of 1.14 with a TBI of 0.81. That was obtained on 06/19/2020. 08/28/2020 upon evaluation today patient appears to be doing decently well in regard to his wounds in general. He has been tolerating the dressing changes without complication. Fortunately there does not appear to be any signs of active  infection which is great news. With that being said I think the [facility] is doing a good job I would recommend that we likely continue with that currently. 09/11/2020 upon evaluation today patient's wounds did not appear to be doing too poorly but again he is not really showing signs of significant improvement with regard to any of the wounds on the right. None of them have Hydrofera Blue on them I am not exactly sure why this is not being followed as the facility did not contact us to let us know of any issues with obtaining dressings or otherwise. With that being said he is supposed to be using Hydrofera Blue on both of the wounds on the right foot as well as the ankle wound on the left side. 09/18/2020 upon evaluation today patient appears to be doing poorly with regard to his wounds. Again right now the left ankle in particular showed signs of extreme maceration. Apparently he was told by someone with staff at [facility] they could not get the [facility]. With that being said this is something that is never been relayed to [place] one way or another. Also the patient subsequently has not supposed to have a border gauze dressing on. He should have an ABD pad and roll gauze to secure as this drains much too much just to have a border gauze dressing to cover. Nonetheless the fact that they are not using the appropriate dressing is directly causing deterioration of the left ankle wound it is significantly worse today compared to what it was previous. I did attempt to call [facility] while the patient was here I called three times and got no one to even pick up the phone. After this I had my for an office coordinator call and she was able to finally get through and leave a message with the D ON as of dictation of this note which is roughly about an hour and a half later I still have not been  able to speak with anyone at the facility. 09/25/2020 upon evaluation today patient actually  showing signs of good improvement which is excellent news. He has been tolerating the dressing changes without complication. Fortunately there is no signs of active infection which is great news. No fevers, chills, nausea, vomiting, or diarrhea. I do feel like the facility has been doing a much better job at taking care of him as far as the dressings are concerned. However the director of nursing never did call me back. 10/09/2020 upon evaluation today patient appears to be doing well with regard to his wound. The toe ulcer did require some debridement but the other 2 areas actually appear to be doing quite well. 10/19/2020 upon evaluation today patient actually appears to be doing very well in regard to his wounds. In fact the heel does appear to be completely healed. The toe is doing better in the medial ankle on the left is also doing better. Overall I think he is headed in the right direction. 10/26/2020 upon evaluation today patient appears to be doing well with regard to his wound. He is showing signs of improvement which is great news and overall I am very pleased with where things stand today. No fevers, chills, nausea, vomiting, or diarrhea. 11/02/2020 upon evaluation today patient appears to be doing well with regard to his wounds. He has been tolerating the dressing changes without complication overall I am extremely pleased with where things stand today. He in regard to the toe is almost completely healed and the medial ankle on the left is doing much better. 11/09/2020 upon evaluation today patient appears to be doing a little poorly in regard to his left medial ankle ulcer. Fortunately there does not appear to be any signs of systemic infection but unfortunately locally he does appear to be infected in fact he has blue-green drainage consistent with Pseudomonas. AUL, MANGIERI ([id number]) 11/16/2020 upon evaluation today patient appears to be doing well with regard to his wound. It actually  appears to be doing better. I did place him on gentamicin cream since the Cipro was actually resistant even though he was positive for Pseudomonas on culture. Overall I think that he does seem to be doing better though I am unsure whether or not they have actually been putting the cream on. The patient is not sure that we did talk to the nurse directly and she was going to initiate that treatment. Fortunately there does not appear to be any signs of active infection at this time. No fevers, chills, nausea, vomiting, or diarrhea. 4/28; the area on the right second toe is close to healed. Left medial ankle required debridement 12/07/2020 upon evaluation today patient appears to be doing well with regard to his wounds. In fact the right second toe appears to be completely healed which is great news. Fortunately there does not appear to be any signs of active infection at this time which is also great news. I think we can probably discontinue the gentamicin on top of everything else. 12/14/2020 upon evaluation today patient appears to be doing well with regard to his wound. He is making good progress and overall very pleased with where things stand today. There is no signs of active infection at this time which is great news. 12/28/2020 upon evaluation today patient appears to be doing well with regard to his wounds. He has been tolerating the dressing changes without complication. Fortunately there is no signs of active infection at this time. No fevers, chills,  nausea, vomiting, or diarrhea. 12/28/2020 upon evaluation today patient's wound bed actually showed signs of excellent improvement. He has great epithelization and granulation I do not see any signs of infection overall I am extremely pleased with where things stand at this point. No fevers, chills, nausea, vomiting, or diarrhea. 01/11/2021 upon evaluation today patient appears to be doing well with regard to his wound on his leg. He has been  tolerating the dressing changes without complication. Fortunately there does not appear to be any signs of active infection which is great news. No fevers, chills, nausea, vomiting, or diarrhea. 01/25/2021 upon evaluation today patient appears to be doing well with regard to his wound. He has been tolerating the dressing changes without complication. Fortunately the collagen seems to be doing a great job which is excellent news. No fevers, chills, nausea, vomiting, or diarrhea. 02/08/2021 upon evaluation today patient's wound is actually looking a little bit worse especially in the periwound compared to previous. Fortunately there does not appear to be any signs of infection which is great news with that being said he does have some irritation around the periphery of the wound which has me more concerned. He actually had a dressing on that had not been changed in 3 days. He also is supposed to have daily dressing changes. With regard to the dressing applied he had a silver alginate dressing and silver collagen is what is recommended and ordered. He also had no Desitin around the edges of the wound in the periwound region although that is on the order inspect to be done as well. In general I was very concerned I did contact  healthcare actually spoke with Magda Paganini who is the scheduling individual and subsequently she stated that she would pass the information to the D ON apparently the D ON was not available to talk to me when I call today. 02/18/2021 upon evaluation today patient's wound is actually showing signs of improvement. Fortunately there does not appear to be any evidence of infection which is great news overall I am extremely pleased with where things stand today. No fevers, chills, nausea, vomiting, or diarrhea. 8/3; patient presents for 1 week follow-up. He has no issues or complaints today. He denies signs of infection. 03/11/2021 upon evaluation today patient appears to be doing well  with regard to his wound. He does have a little bit of slough noted on the surface of the wound but fortunately there does not appear to be any signs of active infection at this time. No fevers, chills, nausea, vomiting, or diarrhea. 03/18/2021 upon evaluation today patient appears to be doing well with regard to his wound. He has been tolerating the dressing changes without complication. There was a little irritation more proximal to where the wound was that was not noted last week but nonetheless this is very superficial just seems to be more irritation we just need to make sure to put a good amount of the zinc over the area in my opinion. Otherwise he does not seem to be doing significantly worse at all which is great news. 03/25/2021 upon evaluation today patient appears to be doing well with regard to his wound. He is going require some sharp debridement today to clear with some of the necrotic debris. I did perform this today without complication postdebridement wound bed appears to be doing much better this is great news. 04/08/2021 upon evaluation today patient appears to be doing decently well in regard to his wound although the overall measurement is  not significantly smaller compared to previous. It is gone down a little bit but still the facility continues to not really put the appropriate dressings in place in fact he was supposed to have collagen we think he probably had more of an allergy to At this point. Fortunately there does not appear to be any signs of active infection systemically though locally I do not see anything on initial visualization either as far as erythema or warmth. 04/15/2021 upon evaluation today patient appears to be doing well with regard to his wound. He is actually showing signs of improvement. I did place him on antibiotics last week, Cipro. He has been taking that 2 times a day and seems to be tolerating it very well. I do not see any evidence of worsening and  in fact the overall appearance of the wound is smaller today which is also great news. 9/26; left medial ankle chronic venous insufficiency wound is improved. Using Hydrofera Blue 10/10; left medial ankle chronic venous insufficiency. Wound has not changed much in appearance completely nonviable surface. Apparently there have been problems getting the right product on the wound at the facility although he came in with [facility] on today 05/14/2021 upon evaluation today patient appears to be doing well with regard to his wound. I think he is making progress here which is good news. Fortunately there does not appear to be any signs of active infection at this time. No fevers, chills, nausea, vomiting, or diarrhea. 05/20/2021 upon evaluation today patient appears to be doing well with regard to his wound. He is showing signs of good improvement which is great news. There does not appear to be any evidence of active infection which is also excellent news. No fevers, chills, nausea, vomiting, or diarrhea. 05/28/2021 upon evaluation today patient appears to be doing quite well. There does not appear to be any signs of active infection at this time which is great news. Overall I am extremely pleased with where things stand today. I think he is headed in the right direction. 06/11/2021 upon evaluation today patient appears to be doing well with regard to his left ankle ulcer and poorly in regard to the toe ulcer on the second toe right foot. This appears to show signs of joint exposure. Apparently this has been present for 1 to 2 months although he kept [facility] [place] ([id number]) forgetting to tell me about it. That is unfortunate as right now it definitely appears to be doing significantly worse than what I would like to see. There does not appear to be any signs of active infection systemically though locally I am concerned about the possibility of infection the toe is quite red. Again no one from  the facility ever contacted [place] to advise that this was going on in the interim either. 06/17/2021 upon evaluation today patient presents for follow-up I did review his x-ray which showed a navicular bone fracture I am unsure of the chronicity of this. Subsequently he also had osteomyelitis of the toe which was what I was more concerned about this did not show up on x-ray but did show up on the pathology scrapings. This was listed as acute osteomyelitis. Nonetheless at this point I think that the antibiotic treatment is the best regimen to go with currently. The patient is in agreement with that plan. Nonetheless he has initially 30 days of doxycycline off likely extend that towards the end of the treatment cycle that will be around the middle of December for an  additional 2 weeks. That all depends on how well he continues to heal. Nonetheless based on what I am seeing in the foot I did want a proceed with an MRI as well which I think will be helpful to identify if there is anything else that needs to be addressed from the standpoint of infection. 06/24/2021 upon evaluation today patient appears to be doing pretty well in regards to his wounds. I think both are actually showing signs of improvement which is good I did review his MRI today which did show signs of osteomyelitis of the middle and proximal phalanx on his right foot of the affected toe. With that being said this is actually showing signs of significant improvement today already with the antibiotic therapy I think the redness is also improved. Overall I think that we just need to give this some time with appropriate wound care we will see how things go potentially hyperbarics could be considered. 07/02/2021 upon inspection today patient actually appears to be doing well in regard to his left ankle which is getting very close to complete resolution of pleased in that regard. Unfortunately he is continuing to have issues with his second toe  right foot and this seems to still be very painful for him. Recommend he try something different from the standpoint of antibiotics. 07/15/2021 upon evaluation today patient appears to be doing actually pretty well in regard to his foot. This is actually showing signs of significant improvement which is great news. Overall I feel like the patient is improving both in regard to the second toe as well as the ankle on the left. With that being said the biggest issue that I do see currently is that he is needing to have a refill of the doxycycline that we previously treated him with. He also did see podiatry they are not going to recommend any amputation at this point since he seems to be doing quite well. For that reason we just need to keep things under control from an infection standpoint. 08/01/2021 upon evaluation today patient appears to be doing well with regard to his wound. He has been tolerating the dressing changes without complication. Fortunately there does not appear to be any evidence of active infection locally nor systemically at this point. In fact I think everything is doing excellent in fact his second toe on the right foot is almost healed and the ankle on the left ankle region is actually very close to being healed as well. 08/08/2021 upon evaluation today patient appears to be doing well with regard to his wound. He has been tolerating the dressing changes without complication. Fortunately I do not see any signs of active infection at this time. Readmission: 12-06-2021 upon evaluation today patient presents for reevaluation here in the clinic he does tell me that he was being seen in facility at [facility] by a provider that was coming in. He is not sure who this was. He tells me however that the wound seems to have gotten worse even compared to where it was when we last saw him at this point. With that being said I do believe that he is likely going need ongoing wound  care here in the clinic and I do believe that we need to be the ones to frontline this since his wound does seem to be getting worse not better at this point. He voiced understanding. He is also in agreement with this plan and feels more comfortable coming here she tells me. Patient's  medical history really has not changed since his prior admission he was only gone since January. 12-27-2021 upon evaluation today patient appears to be doing well with regard to his wound they did run out of the [facility] so they did not put anything on just an ABD pad with gentamicin. Still we are seeing some signs of good improvement here with some new epithelization which is great news. 01-10-2022 upon evaluation today patient appears to be doing well with regard to his wounds and he is going require some sharp debridement but overall seems to be making good progress. Fortunately I do not see any evidence of active infection locally or systemically at this time which is great news. 01-24-2022 upon evaluation today patient appears to be doing well with regard to his wound. The facility actually came and dropped him off early and he had another appointment at the hospital and then they just brought him over here and this was still hours before his appointment this afternoon. For that reason we did do our best to work him in this morning and fortunately had some space to make this happen. With that being said patient's wound does seem to be making progress here and I am very pleased in that regard I do not see any signs of active infection locally or systemically at this time. 02-07-2022 patient appears to be doing well currently in regard to his wounds. In fact one of them the more proximal is healed the distal is still open but seems to be doing excellent. Fortunately I do not see any evidence of active infection locally or systemically at this time which is great news. No fevers, chills, nausea, vomiting, or  diarrhea. 7/27; left medial ankle venous. Improving per our intake nurse. We are using Hydrofera Blue under Tubigrip compression. They are changing that at his facility 03-06-2022 upon evaluation today patient appears to be doing well with regard to the his wound he is can require some sharp debridement but seems to be making excellent progress. Fortunately I do not see any evidence of active infection locally or systemically which is great news. 03-27-2022 upon evaluation today patient's wound is actually showing signs of excellent improvement. Fortunately I see no signs of active infection locally or systemically at this time which is great news. No fevers, chills, nausea, vomiting, or diarrhea. Electronic Signature(s) Signed: 03/27/2022 3:49:39 PM By: Worthy Keeler PA-C Entered By: Worthy Keeler on 03/27/2022 15:49:39 OREY, MOURE ([id number]) -------------------------------------------------------------------------------- Physical Exam Details Patient Name: Chad [name] Date of Service: 03/27/2022 3:15 PM Medical Record Number: [id number] Patient Account Number: [id number] Date of Birth/Sex: 1960-11-27 (61 y.o. M) Treating RN: Rhae Hammock Primary Care Provider: [name] Other Clinician: Referring Provider: [name] Treating Provider/Extender: Skipper Cliche in Treatment: 67 Constitutional Well-nourished and well-hydrated in no acute distress. Respiratory normal breathing without difficulty. Psychiatric this patient is able to make decisions and demonstrates good insight into disease process. Alert and Oriented x 3. pleasant and cooperative. Notes Upon inspection patient's wound bed showed evidence of minimal slough and biofilm buildup there was minimal sharp debridement necessary the wound is almost completely closed and I am very pleased in that regard. Electronic Signature(s) Signed: 03/27/2022 3:49:51 PM By: Worthy Keeler PA-C Entered By: Worthy Keeler on 03/27/2022 15:49:51 Chad [name] ([id number]) -------------------------------------------------------------------------------- Physician Orders Details Patient Name: Chad [name] Date of Service: 03/27/2022 3:15 PM Medical Record Number: [id number] Patient Account Number: [id number] Date of Birth/Sex: [birth year] (61 y.o. M) Treating RN:  Rhae Hammock Primary Care Provider: [name] Other Clinician: Referring Provider: [name] Treating Provider/Extender: Skipper Cliche in Treatment: 15 Verbal / Phone Orders: No Diagnosis Coding ICD-10 Coding Code Description [phone] Chronic venous hypertension (idiopathic) with ulcer and inflammation of left lower extremity L97.322 Non-pressure chronic ulcer of left ankle with fat layer exposed 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 side Follow-up Appointments o Return Appointment in 2 weeks. Bathing/ Shower/ Hygiene o Wash wounds with antibacterial soap and water. o No tub bath. Anesthetic (Use 'Patient Medications' Section for Anesthetic Order Entry) o Lidocaine applied to wound bed Wound Treatment Wound #12 - Ankle Wound Laterality: Left, Medial, Distal Cleanser: Soap and Water 3 x Per Week/30 Days Discharge Instructions: Gently cleanse wound with antibacterial soap, rinse and pat dry prior to dressing wounds Cleanser: Wound Cleanser 3 x Per Week/30 Days Discharge Instructions: Wash your hands with soap and water. Remove old dressing, discard into plastic bag and place into trash. Cleanse the wound with Wound Cleanser prior to applying a clean dressing using gauze sponges, not tissues or cotton balls. Do not scrub or use excessive force. Pat dry using gauze sponges, not tissue or cotton balls. Peri-Wound Care: Desitin Maximum Strength Ointment 4 (oz) 3 x Per Week/30 Days Discharge Instructions: Apply around wound edges to avoid maceration Topical:  Gentamicin 3 x Per Week/30 Days Discharge Instructions: Apply as directed by provider. Primary Dressing: Hydrofera Blue Ready Transfer Foam, 2.5x2.5 (in/in) 3 x Per Week/30 Days Discharge Instructions: Apply Hydrofera Blue Ready to wound bed as directed Secondary Dressing: ABD Pad 5x9 (in/in) 3 x Per Week/30 Days Discharge Instructions: Cover with ABD pad Secured With: Medipore Tape - 43M Medipore H Soft Cloth Surgical Tape, 2x2 (in/yd) 3 x Per Week/30 Days Secured With: [employer] Sterile or Non-Sterile 6-ply 4.5x4 (yd/yd) 3 x Per Week/30 Days Discharge Instructions: Apply Kerlix as directed Secured With: Tubigrip Size C, 2.75x10 (in/yd) 3 x Per Week/30 Days Discharge Instructions: Apply 3 Tubigrip C 3-finger-widths below knee to base of toes to secure dressing and/or for swelling. This can be hand washed and air dried Electronic Signature(s) Signed: 03/27/2022 4:20:12 PM By: Rhae Hammock RN Signed: 03/27/2022 5:40:29 PM By: Worthy Keeler PA-C Entered By: Rhae Hammock on 03/27/2022 16:11:42 KAO, CONRY ([id number]) HOLBERT, CAPLES ([id number]) -------------------------------------------------------------------------------- Problem List Details Patient Name: Chad [name] Date of Service: 03/27/2022 3:15 PM Medical Record Number: [id number] Patient Account Number: [id number] Date of Birth/Sex: Mar 19, 1961 (61 y.o. M) Treating RN: Rhae Hammock Primary Care Provider: [name] Other Clinician: Referring Provider: [name] Treating Provider/Extender: Skipper Cliche in Treatment: 15 Active Problems ICD-10 Encounter Code Description Active Date MDM Diagnosis 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 (idiopathic) with ulcer and inflammation of 12/06/2021 No Yes left lower extremity L97.322 Non-pressure chronic ulcer of left ankle with fat layer exposed 12/06/2021 No Yes I69.354 Hemiplegia and hemiparesis following cerebral infarction affecting left 12/06/2021 No  Yes non-dominant side Inactive Problems Resolved Problems Electronic Signature(s) Signed: 03/27/2022 3:23:02 PM By: Worthy Keeler PA-C Entered By: Worthy Keeler on 03/27/2022 15:23:02 Chad [name] ([id number]) -------------------------------------------------------------------------------- Progress Note Details Patient Name: Chad [name] Date of Service: 03/27/2022 3:15 PM Medical Record Number: [id number] Patient Account Number: [id number] Date of Birth/Sex: [birth year] (61 y.o. M) Treating RN: Rhae Hammock Primary Care Provider: [name] Other Clinician: Referring Provider: [name] Treating Provider/Extender: Skipper Cliche in Treatment: 15 Subjective Chief Complaint Information obtained from Patient Left ankle ulcers History of Present Illness (HPI) 10/08/18 on evaluation today  patient actually presents to our office for initial evaluation concerning wounds that he has of the bilateral lower extremities. 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 He also has bilateral venous stasis. He apparently has been homeless currently following discharge from the hospital apparently he has been placed at [facility] which is is a skilled nursing facility locally. Nonetheless fortunately he does not show any signs of infection at this time which is good news. In fact several of the wound actually appears to be showing some signs of improvement already in my pinion. There are a couple areas in the left leg in particular there likely gonna require some sharp debridement to help clear away some necrotic tissue and help with more sufficient healing. No fevers, chills, nausea, or vomiting noted at this time. 10/15/18 on evaluation today patient actually appears to be doing very well in regard to his bilateral lower  extremities. He's been tolerating the dressing changes without complication. Fortunately there does not appear to be any evidence of active infection at this time which is great news. Overall I'm actually very pleased with how this has progressed in just one visits time. Readmission: 08/14/2020 upon evaluation today patient presents for re-evaluation here in our clinic. He is having issues with his left ankle region as well as his right toe and his right heel. He tells me that the toe and heel actually began as a area that was itching that he was scratching and then subsequently opened up into wounds. These may have been abscess areas I presume based on what I am seeing currently. With regard to his left ankle region he tells me this was a similar type occurrence although he does have venous stasis this very well may be more of a venous leg ulcer more than anything. Nonetheless I do believe that the patient would benefit from appropriate and aggressive wound care to try to help get things under better control here. He does have history of a stroke on the left side affecting him to some degree there that he is able to stand although he does have some residual weakness. Otherwise again the patient does have chronic venous insufficiency as previously noted. His arterial studies most recently obtained showed that he had an ABI on the right of 1.16 with a TBI of 0.52 and on the left and ABI of 1.14 with a TBI of 0.81. That was obtained on 06/19/2020. 08/28/2020 upon evaluation today patient appears to be doing decently well in regard to his wounds in general. He has been tolerating the dressing changes without complication. Fortunately there does not appear to be any signs of active infection which is great news. With that being said I think the [facility] is doing a good job I would recommend that we likely continue with that currently. 09/11/2020 upon evaluation today patient's wounds did not appear to  be doing too poorly but again he is not really showing signs of significant improvement with regard to any of the wounds on the right. None of them have Hydrofera Blue on them I am not exactly sure why this is not being followed as the facility did not contact us to let us know of any issues with obtaining dressings or otherwise. With that being said he is supposed to be using Hydrofera Blue on both of the wounds on the  right foot as well as the ankle wound on the left side. 09/18/2020 upon evaluation today patient appears to be doing poorly with regard to his wounds. Again right now the left ankle in particular showed signs of extreme maceration. Apparently he was told by someone with staff at [facility] they could not get the [facility]. With that being said this is something that is never been relayed to [place] one way or another. Also the patient subsequently has not supposed to have a border gauze dressing on. He should have an ABD pad and roll gauze to secure as this drains much too much just to have a border gauze dressing to cover. Nonetheless the fact that they are not using the appropriate dressing is directly causing deterioration of the left ankle wound it is significantly worse today compared to what it was previous. I did attempt to call [facility] while the patient was here I called three times and got no one to even pick up the phone. After this I had my for an office coordinator call and she was able to finally get through and leave a message with the D ON as of dictation of this note which is roughly about an hour and a half later I still have not been able to speak with anyone at the facility. 09/25/2020 upon evaluation today patient actually showing signs of good improvement which is excellent news. He has been tolerating the dressing changes without complication. Fortunately there is no signs of active infection which is great news. No fevers, chills, nausea,  vomiting, or diarrhea. I do feel like the facility has been doing a much better job at taking care of him as far as the dressings are concerned. However the director of nursing never did call me back. 10/09/2020 upon evaluation today patient appears to be doing well with regard to his wound. The toe ulcer did require some debridement but the other 2 areas actually appear to be doing quite well. 10/19/2020 upon evaluation today patient actually appears to be doing very well in regard to his wounds. In fact the heel does appear to be completely healed. The toe is doing better in the medial ankle on the left is also doing better. Overall I think he is headed in the right direction. 10/26/2020 upon evaluation today patient appears to be doing well with regard to his wound. He is showing signs of improvement which is great news and overall I am very pleased with where things stand today. No fevers, chills, nausea, vomiting, or diarrhea. 11/02/2020 upon evaluation today patient appears to be doing well with regard to his wounds. He has been tolerating the dressing changes without complication overall I am extremely pleased with where things stand today. He in regard to the toe is almost completely healed and the medial [facility] ([id number]) ankle on the left is doing much better. 11/09/2020 upon evaluation today patient appears to be doing a little poorly in regard to his left medial ankle ulcer. Fortunately there does not appear to be any signs of systemic infection but unfortunately locally he does appear to be infected in fact he has blue-green drainage consistent with Pseudomonas. 11/16/2020 upon evaluation today patient appears to be doing well with regard to his wound. It actually appears to be doing better. I did place him on gentamicin cream since the Cipro was actually resistant even though he was positive for Pseudomonas on culture. Overall I think that he does seem to be doing better  though I  am unsure whether or not they have actually been putting the cream on. The patient is not sure that we did talk to the nurse directly and she was going to initiate that treatment. Fortunately there does not appear to be any signs of active infection at this time. No fevers, chills, nausea, vomiting, or diarrhea. 4/28; the area on the right second toe is close to healed. Left medial ankle required debridement 12/07/2020 upon evaluation today patient appears to be doing well with regard to his wounds. In fact the right second toe appears to be completely healed which is great news. Fortunately there does not appear to be any signs of active infection at this time which is also great news. I think we can probably discontinue the gentamicin on top of everything else. 12/14/2020 upon evaluation today patient appears to be doing well with regard to his wound. He is making good progress and overall very pleased with where things stand today. There is no signs of active infection at this time which is great news. 12/28/2020 upon evaluation today patient appears to be doing well with regard to his wounds. He has been tolerating the dressing changes without complication. Fortunately there is no signs of active infection at this time. No fevers, chills, nausea, vomiting, or diarrhea. 12/28/2020 upon evaluation today patient's wound bed actually showed signs of excellent improvement. He has great epithelization and granulation I do not see any signs of infection overall I am extremely pleased with where things stand at this point. No fevers, chills, nausea, vomiting, or diarrhea. 01/11/2021 upon evaluation today patient appears to be doing well with regard to his wound on his leg. He has been tolerating the dressing changes without complication. Fortunately there does not appear to be any signs of active infection which is great news. No fevers, chills, nausea, vomiting, or diarrhea. 01/25/2021 upon evaluation today  patient appears to be doing well with regard to his wound. He has been tolerating the dressing changes without complication. Fortunately the collagen seems to be doing a great job which is excellent news. No fevers, chills, nausea, vomiting, or diarrhea. 02/08/2021 upon evaluation today patient's wound is actually looking a little bit worse especially in the periwound compared to previous. Fortunately there does not appear to be any signs of infection which is great news with that being said he does have some irritation around the periphery of the wound which has me more concerned. He actually had a dressing on that had not been changed in 3 days. He also is supposed to have daily dressing changes. With regard to the dressing applied he had a silver alginate dressing and silver collagen is what is recommended and ordered. He also had no Desitin around the edges of the wound in the periwound region although that is on the order inspect to be done as well. In general I was very concerned I did contact [facility] actually spoke with Magda Paganini who is the scheduling individual and subsequently she stated that she would pass the information to the D ON apparently the D ON was not available to talk to me when I call today. 02/18/2021 upon evaluation today patient's wound is actually showing signs of improvement. Fortunately there does not appear to be any evidence of infection which is great news overall I am extremely pleased with where things stand today. No fevers, chills, nausea, vomiting, or diarrhea. 8/3; patient presents for 1 week follow-up. He has no issues or complaints today. He  denies signs of infection. 03/11/2021 upon evaluation today patient appears to be doing well with regard to his wound. He does have a little bit of slough noted on the surface of the wound but fortunately there does not appear to be any signs of active infection at this time. No fevers, chills, nausea, vomiting,  or diarrhea. 03/18/2021 upon evaluation today patient appears to be doing well with regard to his wound. He has been tolerating the dressing changes without complication. There was a little irritation more proximal to where the wound was that was not noted last week but nonetheless this is very superficial just seems to be more irritation we just need to make sure to put a good amount of the zinc over the area in my opinion. Otherwise he does not seem to be doing significantly worse at all which is great news. 03/25/2021 upon evaluation today patient appears to be doing well with regard to his wound. He is going require some sharp debridement today to clear with some of the necrotic debris. I did perform this today without complication postdebridement wound bed appears to be doing much better this is great news. 04/08/2021 upon evaluation today patient appears to be doing decently well in regard to his wound although the overall measurement is not significantly smaller compared to previous. It is gone down a little bit but still the facility continues to not really put the appropriate dressings in place in fact he was supposed to have collagen we think he probably had more of an allergy to At this point. Fortunately there does not appear to be any signs of active infection systemically though locally I do not see anything on initial visualization either as far as erythema or warmth. 04/15/2021 upon evaluation today patient appears to be doing well with regard to his wound. He is actually showing signs of improvement. I did place him on antibiotics last week, Cipro. He has been taking that 2 times a day and seems to be tolerating it very well. I do not see any evidence of worsening and in fact the overall appearance of the wound is smaller today which is also great news. 9/26; left medial ankle chronic venous insufficiency wound is improved. Using Hydrofera Blue 10/10; left medial ankle chronic venous  insufficiency. Wound has not changed much in appearance completely nonviable surface. Apparently there have been problems getting the right product on the wound at the facility although he came in with [facility] on today 05/14/2021 upon evaluation today patient appears to be doing well with regard to his wound. I think he is making progress here which is good news. Fortunately there does not appear to be any signs of active infection at this time. No fevers, chills, nausea, vomiting, or diarrhea. 05/20/2021 upon evaluation today patient appears to be doing well with regard to his wound. He is showing signs of good improvement which is great news. There does not appear to be any evidence of active infection which is also excellent news. No fevers, chills, nausea, vomiting, or diarrhea. MANJOT, HINKS ([id number]) 05/28/2021 upon evaluation today patient appears to be doing quite well. There does not appear to be any signs of active infection at this time which is great news. Overall I am extremely pleased with where things stand today. I think he is headed in the right direction. 06/11/2021 upon evaluation today patient appears to be doing well with regard to his left ankle ulcer and poorly in regard to the toe ulcer on  the second toe right foot. This appears to show signs of joint exposure. Apparently this has been present for 1 to 2 months although he kept forgetting to tell me about it. That is unfortunate as right now it definitely appears to be doing significantly worse than what I would like to see. There does not appear to be any signs of active infection systemically though locally I am concerned about the possibility of infection the toe is quite red. Again no one from the facility ever contacted [place] to advise that this was going on in the interim either. 06/17/2021 upon evaluation today patient presents for follow-up I did review his x-ray which showed a navicular bone fracture I am  unsure of the chronicity of this. Subsequently he also had osteomyelitis of the toe which was what I was more concerned about this did not show up on x-ray but did show up on the pathology scrapings. This was listed as acute osteomyelitis. Nonetheless at this point I think that the antibiotic treatment is the best regimen to go with currently. The patient is in agreement with that plan. Nonetheless he has initially 30 days of doxycycline off likely extend that towards the end of the treatment cycle that will be around the middle of December for an additional 2 weeks. That all depends on how well he continues to heal. Nonetheless based on what I am seeing in the foot I did want a proceed with an MRI as well which I think will be helpful to identify if there is anything else that needs to be addressed from the standpoint of infection. 06/24/2021 upon evaluation today patient appears to be doing pretty well in regards to his wounds. I think both are actually showing signs of improvement which is good I did review his MRI today which did show signs of osteomyelitis of the middle and proximal phalanx on his right foot of the affected toe. With that being said this is actually showing signs of significant improvement today already with the antibiotic therapy I think the redness is also improved. Overall I think that we just need to give this some time with appropriate wound care we will see how things go potentially hyperbarics could be considered. 07/02/2021 upon inspection today patient actually appears to be doing well in regard to his left ankle which is getting very close to complete resolution of pleased in that regard. Unfortunately he is continuing to have issues with his second toe right foot and this seems to still be very painful for him. Recommend he try something different from the standpoint of antibiotics. 07/15/2021 upon evaluation today patient appears to be doing actually pretty well in  regard to his foot. This is actually showing signs of significant improvement which is great news. Overall I feel like the patient is improving both in regard to the second toe as well as the ankle on the left. With that being said the biggest issue that I do see currently is that he is needing to have a refill of the doxycycline that we previously treated him with. He also did see podiatry they are not going to recommend any amputation at this point since he seems to be doing quite well. For that reason we just need to keep things under control from an infection standpoint. 08/01/2021 upon evaluation today patient appears to be doing well with regard to his wound. He has been tolerating the dressing changes without complication. Fortunately there does not appear to be any evidence  of active infection locally nor systemically at this point. In fact I think everything is doing excellent in fact his second toe on the right foot is almost healed and the ankle on the left ankle region is actually very close to being healed as well. 08/08/2021 upon evaluation today patient appears to be doing well with regard to his wound. He has been tolerating the dressing changes without complication. Fortunately I do not see any signs of active infection at this time. Readmission: 12-06-2021 upon evaluation today patient presents for reevaluation here in the clinic he does tell me that he was being seen in facility at [place] by a provider that was coming in. He is not sure who this was. He tells me however that the wound seems to have gotten worse even compared to where it was when we last saw him at this point. With that being said I do believe that he is likely going need ongoing wound care here in the clinic and I do believe that we need to be the ones to frontline this since his wound does seem to be getting worse not better at this point. He voiced understanding. He is also in agreement with this plan  and feels more comfortable coming here she tells me. Patient's medical history really has not changed since his prior admission he was only gone since January. 12-27-2021 upon evaluation today patient appears to be doing well with regard to his wound they did run out of the [facility] so they did not put anything on just an ABD pad with gentamicin. Still we are seeing some signs of good improvement here with some new epithelization which is great news. 01-10-2022 upon evaluation today patient appears to be doing well with regard to his wounds and he is going require some sharp debridement but overall seems to be making good progress. Fortunately I do not see any evidence of active infection locally or systemically at this time which is great news. 01-24-2022 upon evaluation today patient appears to be doing well with regard to his wound. The facility actually came and dropped him off early and he had another appointment at the hospital and then they just brought him over here and this was still hours before his appointment this afternoon. For that reason we did do our best to work him in this morning and fortunately had some space to make this happen. With that being said patient's wound does seem to be making progress here and I am very pleased in that regard I do not see any signs of active infection locally or systemically at this time. 02-07-2022 patient appears to be doing well currently in regard to his wounds. In fact one of them the more proximal is healed the distal is still open but seems to be doing excellent. Fortunately I do not see any evidence of active infection locally or systemically at this time which is great news. No fevers, chills, nausea, vomiting, or diarrhea. 7/27; left medial ankle venous. Improving per our intake nurse. We are using Hydrofera Blue under Tubigrip compression. They are changing that at his facility 03-06-2022 upon evaluation today patient appears to be doing  well with regard to the his wound he is can require some sharp debridement but seems to be making excellent progress. Fortunately I do not see any evidence of active infection locally or systemically which is great news. 03-27-2022 upon evaluation today patient's wound is actually showing signs of excellent improvement. Fortunately I see  no signs of active infection locally or systemically at this time which is great news. No fevers, chills, nausea, vomiting, or diarrhea. CLEVESTER, HELZER ([id number]) Objective Constitutional Well-nourished and well-hydrated in no acute distress. Vitals Time Taken: 4:00 PM, Height: 67 in, Weight: 150 lbs, BMI: 23.5, Temperature: 98.7 F, Pulse: 74 bpm, Respiratory Rate: 17 breaths/min, Blood Pressure: 134/74 mmHg. Respiratory normal breathing without difficulty. Psychiatric this patient is able to make decisions and demonstrates good insight into disease process. Alert and Oriented x 3. pleasant and cooperative. General Notes: Upon inspection patient's wound bed showed evidence of minimal slough and biofilm buildup there was minimal sharp debridement necessary the wound is almost completely closed and I am very pleased in that regard. Integumentary (Hair, Skin) Wound #12 status is Open. Original cause of wound was Gradually Appeared. The date acquired was: 07/12/2019. The wound has been in treatment 15 weeks. The wound is located on the Left,Distal,Medial Ankle. The wound measures 2.5cm length x 1cm width x 0.2cm depth; 1.963cm^2 area and 0.393cm^3 volume. There is Fat Layer (Subcutaneous Tissue) exposed. There is no tunneling or undermining noted. There is a medium amount of serosanguineous drainage noted. The wound margin is distinct with the outline attached to the wound base. There is large (67-100%) pink granulation within the wound bed. There is a small (1-33%) amount of necrotic tissue within the wound bed including Adherent Slough. 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 There was a Selective/Open Wound Skin/Dermis Debridement with a total area of 2.5 sq cm performed by Tommie Sams., PA-C. With the following instrument(s): Curette to remove Viable and Non-Viable tissue/material. Material removed includes Skin: Dermis and Biofilm and after achieving pain control using Lidocaine. No specimens were taken. A time out was conducted at 15:04, prior to the start of the procedure. A Minimum amount of bleeding was controlled with Pressure. The procedure was tolerated well with a pain level of 0 throughout and a pain level of 0 following the procedure. Post Debridement Measurements: 2.5cm length x 1cm width x 0.2cm depth; 0.393cm^3 volume. Character of Wound/Ulcer Post Debridement is improved. Severity of Tissue Post Debridement is: Fat layer exposed. Post procedure Diagnosis Wound #12: Same as Pre-Procedure Plan Follow-up Appointments: Return Appointment in 2 weeks. Bathing/ Shower/ Hygiene: Wash wounds with antibacterial soap and water. No tub bath. JAMS, TRICKETT ([id number]) Anesthetic (Use 'Patient Medications' Section for Anesthetic Order Entry): Lidocaine applied to wound bed WOUND #12: - Ankle Wound Laterality: Left, Medial, Distal Cleanser: Soap and Water 3 x Per Week/30 Days Discharge Instructions: Gently cleanse wound with antibacterial soap, rinse and pat dry prior to dressing wounds Cleanser: Wound Cleanser 3 x Per Week/30 Days Discharge Instructions: Wash your hands with soap and water. Remove old dressing,  discard into plastic bag and place into trash. Cleanse the wound with Wound Cleanser prior to applying a clean dressing using gauze sponges, not tissues or cotton balls. Do not scrub or use excessive force. Pat dry using gauze sponges, not tissue or cotton balls. Peri-Wound Care: Desitin Maximum Strength Ointment 4 (oz) 3 x Per Week/30 Days Discharge Instructions: Apply around wound edges to avoid maceration Topical: Gentamicin 3 x Per Week/30 Days  Discharge Instructions: Apply as directed by provider. Primary Dressing: Hydrofera Blue Ready Transfer Foam, 2.5x2.5 (in/in) 3 x Per Week/30 Days Discharge Instructions: Apply Hydrofera Blue Ready to wound bed as directed Secondary Dressing: ABD Pad 5x9 (in/in) 3 x Per Week/30 Days Discharge Instructions: Cover with ABD pad Secured With: Medipore Tape - 31M Medipore H Soft Cloth Surgical Tape, 2x2 (in/yd) 3 x Per Week/30 Days Secured With: [employer] Sterile or Non-Sterile 6-ply 4.5x4 (yd/yd) 3 x Per Week/30 Days Discharge Instructions: Apply Kerlix as directed Secured With: Tubigrip Size C, 2.75x10 (in/yd) 3 x Per Week/30 Days Discharge Instructions: Apply 3 Tubigrip C 3-finger-widths below knee to base of toes to secure dressing and/or for swelling. This can be hand washed and air dried 1. I would recommend that we go ahead and continue with the recommendation for wound care measures as before this includes the use of the [facility] which I think is really doing quite well. 2. I am also can recommend that we have the patient continue to monitor for any signs of worsening or infection. Office if anything changes he should contact the office and let me know. 3. I am good recommend as well that he continue with zinc around the edges of the wound which is definitely protecting this as well. We will see patient back for reevaluation in 2 weeks here in the clinic. If anything worsens or changes patient will contact our office for  additional recommendations. Electronic Signature(s) Signed: 04/07/2022 8:31:06 AM By: Worthy Keeler PA-C Previous Signature: 03/27/2022 3:50:32 PM Version By: Worthy Keeler PA-C Entered By: Worthy Keeler on 04/07/2022 08:31:05 Chad [name] ([id number]) -------------------------------------------------------------------------------- SuperBill Details Patient Name: Chad [name] Date of Service: 03/27/2022 Medical Record Number: [id number] Patient Account Number: [id number] Date of Birth/Sex: [birth year] (61 y.o. M) Treating RN: Rhae Hammock Primary Care Provider: [name] Other Clinician: Referring Provider: [name] Treating Provider/Extender: Skipper Cliche in Treatment: 15 Diagnosis Coding ICD-10 Codes Code Description [phone] Chronic venous hypertension (idiopathic) with ulcer and inflammation of left lower extremity L97.322 Non-pressure chronic ulcer of left ankle with fat layer exposed 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 side Facility Procedures CPT4 Code: 62376283 Description: 15176 - DEBRIDE WOUND 1ST 20 SQ CM OR < Modifier: Quantity: 1 CPT4 Code: Description: ICD-10 Diagnosis Description L97.322 Non-pressure chronic ulcer of left ankle with fat layer exposed Modifier: Quantity: Physician Procedures CPT4 Code: 1607371 Description: 97597 - WC PHYS DEBR WO ANESTH 20 SQ CM Modifier: Quantity: 1 CPT4 Code: Description: ICD-10 Diagnosis Description G62.694 Non-pressure chronic ulcer of left ankle with fat layer exposed Modifier: Quantity: Electronic Signature(s) Signed: 03/27/2022 5:38:00 PM By: Worthy Keeler PA-C Previous Signature: 03/27/2022 4:20:12 PM Version By: Rhae Hammock RN Entered By: Worthy Keeler on 03/27/2022 17:37:59

## 2022-04-04 MED FILL — Iron Sucrose Inj 20 MG/ML (Fe Equiv): INTRAVENOUS | Qty: 10 | Status: AC

## 2022-04-07 ENCOUNTER — Inpatient Hospital Stay: Payer: Medicaid Other

## 2022-04-07 ENCOUNTER — Inpatient Hospital Stay: Payer: Medicaid Other | Admitting: Internal Medicine

## 2022-04-07 ENCOUNTER — Ambulatory Visit: Payer: Medicaid Other

## 2022-04-07 NOTE — Assessment & Plan Note (Deleted)
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 On keytruda-CT scan- JUNE 30th, 2023- Stable a thickening of the right ureteral area- no obvious progression of disease noted on imaging. STABLE.   # Proceed with  [place] today; Labs today reviewed; Labs today reviewed;  acceptable for treatment today. STABLE. TSH April 2023--WNL.  TSH pending today.   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  JUNE 2023 CT scan- Focal wall thickening in the proximal sigmoid colon; pericolonic lymph nodes overall stable.  I again reviewed with the patient that he is a poor candidate for surgery; s/p evaluation with Dr. Bary Castilla. STABLE.   # Iron deficiency anemia-hemoglobin 11-12; FEB 2023- iron studies-7/ferritin-18 LOW; proceed with venofer next visit  # Hypocalcemia- Mild- on Ca+vit D; JUNE 2023- 25-OH vit D levels: 30. STABLE.    #DISPOSITION:  # ADD TSH today  #  Keytruda;   # follow up in 3 weeks-;NP- labs-cbc/cmp; iron studies; ferritin. ;  Keytruda; venofer today; .  # follow up in 6  weeks-;NP- labs-cbc/cmp;  Keytruda; venofer - Dr.B.  - Dr.B.

## 2022-04-07 NOTE — Progress Notes (Deleted)
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  Is alone/in a wheelchair.  Chad [name] 61 y.o.  male above history of stage IV-ureteral cancer/history of stage III colon cancer right side; recurrent sigmoid colon cancer [un-resected/under surveillance] and multiple other comorbidities currently on Keytruda is here for follow-up/review results of the CT scan  Denies any worsening abdominal pain.  Any nausea vomiting or constipation.  No blood in stools or black-colored stools.  No hematuria.  Chronic shortness of breath.   Review of Systems  Constitutional:  Positive for malaise/fatigue.  Negative for chills, diaphoresis, fever and weight loss.  HENT:  Negative for nosebleeds and sore throat.   Eyes:  Negative for double vision.  Respiratory:  Positive for cough and shortness of breath. Negative for hemoptysis and wheezing.   Cardiovascular:  Negative for chest pain, palpitations and orthopnea.  Gastrointestinal:  Positive for constipation. Negative for abdominal pain, blood in stool, diarrhea, heartburn, melena, nausea and vomiting.  Genitourinary:  Negative for dysuria, frequency and urgency.  Musculoskeletal:  Positive for back pain and joint pain.  Skin: Negative.  Negative for itching and rash.  Neurological:  Positive for focal weakness. Negative for dizziness, tingling, weakness and headaches.       Chronic left-sided weakness upper than lower extremity.  Endo/Heme/Allergies:  Does not bruise/bleed easily.  Psychiatric/Behavioral:  Negative for depression. The patient is not nervous/anxious and does not have insomnia.      MEDICAL HISTORY:  Past Medical History:  Diagnosis Date  . Anemia   . Anxiety   . ARF (acute respiratory failure) ([place])   . Bladder cancer ([place])   . COPD (chronic obstructive pulmonary disease) ([place])   . Depression   . Dysphagia   . Family history of colon cancer   . Family history of kidney cancer   . Family history of leukemia   . Family history of prostate cancer   . GERD (gastroesophageal reflux disease)   . Hepatitis    chronic hep c  . Hydronephrosis   . Hydronephrosis with ureteral stricture   . Hyperlipidemia   . Knee pain    Left  . Malignant neoplasm of colon ([place])   . Nerve pain   . Peripheral vascular disease ([place])   .  Prostate cancer ([place])   . Stroke ([place])   . Ureteral cancer, right ([place])   . Urinary frequency   . Venous hypertension of both lower extremities     SURGICAL HISTORY: Past Surgical History:  Procedure Laterality Date  . COLON SURGERY     En bloc extended right hemicolectomy 07/2017  . COLONOSCOPY  WITH PROPOFOL N/A 11/06/2020   Procedure: COLONOSCOPY WITH PROPOFOL;  Surgeon: Jonathon Bellows, MD;  Location: [facility] ENDOSCOPY;  Service: Gastroenterology;  Laterality: N/A;  . COLONOSCOPY WITH PROPOFOL N/A 07/31/2021   Procedure: COLONOSCOPY WITH PROPOFOL;  Surgeon: Wai Bellow, MD;  Location: [facility] ENDOSCOPY;  Service: Endoscopy;  Laterality: N/A;  . CYSTOSCOPY W/ RETROGRADES Right 08/30/2018   Procedure: CYSTOSCOPY WITH RETROGRADE PYELOGRAM;  Surgeon: Hollice Espy, MD;  Location: [facility] ORS;  Service: Urology;  Laterality: Right;  . CYSTOSCOPY WITH STENT PLACEMENT Right 04/25/2018   Procedure: CYSTOSCOPY WITH STENT PLACEMENT;  Surgeon: Hollice Espy, MD;  Location: [facility] ORS;  Service: Urology;  Laterality: Right;  . CYSTOSCOPY WITH STENT PLACEMENT Right 08/30/2018   Procedure: [place] WITH STENT Exchange;  Surgeon: Hollice Espy, MD;  Location: [facility] ORS;  Service: Urology;  Laterality: Right;  . CYSTOSCOPY WITH STENT PLACEMENT Right 03/07/2019   Procedure: CYSTOSCOPY WITH STENT Exchange;  Surgeon: Hollice Espy, MD;  Location: [facility] ORS;  Service: Urology;  Laterality: Right;  . CYSTOSCOPY WITH STENT PLACEMENT Right 11/21/2019   Procedure: CYSTOSCOPY WITH STENT Exchange;  Surgeon: Hollice Espy, MD;  Location: [facility] ORS;  Service: Urology;  Laterality: Right;  . LOWER EXTREMITY ANGIOGRAPHY Left 05/23/2019   Procedure: LOWER EXTREMITY ANGIOGRAPHY;  Surgeon: Algernon Huxley, MD;  Location: [place] CV LAB;  Service: Cardiovascular;  Laterality: Left;  . LOWER EXTREMITY ANGIOGRAPHY Right 05/30/2019   Procedure: LOWER EXTREMITY ANGIOGRAPHY;  Surgeon: Algernon Huxley, MD;  Location: [place] CV LAB;  Service: Cardiovascular;  Laterality: Right;  . LOWER EXTREMITY ANGIOGRAPHY Right 02/13/2020   Procedure: LOWER EXTREMITY ANGIOGRAPHY;  Surgeon: Algernon Huxley, MD;  Location: [place] CV LAB;  Service: Cardiovascular;  Laterality: Right;  . LOWER EXTREMITY ANGIOGRAPHY Left 02/20/2020    Procedure: LOWER EXTREMITY ANGIOGRAPHY;  Surgeon: Algernon Huxley, MD;  Location: [place] CV LAB;  Service: Cardiovascular;  Laterality: Left;  . PORTA CATH INSERTION N/A 02/28/2019   Procedure: PORTA CATH INSERTION;  Surgeon: Algernon Huxley, MD;  Location: [place] CV LAB;  Service: Cardiovascular;  Laterality: N/A;  . tumor removed       SOCIAL HISTORY: Social History   Socioeconomic History  . Marital status: Single    Spouse name: Not on file  . Number of children: Not on file  . Years of education: Not on file  . Highest education level: Not on file  Occupational History  . Not on file  Tobacco Use  . Smoking status: Every Day    Packs/day: 1.00    Types: Cigarettes  . Smokeless tobacco: Never  Vaping Use  . Vaping Use: Never used  Substance and Sexual Activity  . Alcohol use: Not Currently  . Drug use: Not Currently  . Sexual activity: Not Currently  Other Topics Concern  . Not on file  Social History Narrative    used to live [place]; moved  To [place]- end of April 2019; in Nursing home; 1pp/day; quit alcohol. 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        Family- dad- prostate ca [at 4y]; brother- 33 died of prostate cancer; brother- 18-  no cancers [[place]]; sonGerald Stabs [[place]];[name]-32y prostate ca [facility] [place]]; daughter- 35 [[place]]; another daughter 39 [[place]/addict]. will refer genetics counseling. Given MSI- abnormal; highly suspicious of Lynch syndrome.  Patient's son [name] aware of high possible lynch syndrome.   Social Determinants of Health   Financial Resource Strain: Not on file  Food Insecurity: Not on file  Transportation Needs: Not on file  Physical Activity: Not on file  Stress: Not on file  Social Connections: Not on file  Intimate Partner Violence: Not on file    FAMILY HISTORY: Family History  Problem Relation Age of Onset  . Prostate cancer Father 81  . Cancer Brother 72       unsure type  . Cancer Paternal Uncle        unsure type  . Cancer  Maternal Grandmother        unsure type  . Cancer Paternal Grandmother        unsure type  . Kidney cancer Paternal Grandfather   . Cancer Other        unsure types  . Leukemia Son   . Cancer Son        other cancers, possibly colon    ALLERGIES:  is allergic to penicillins.  MEDICATIONS:  Current Outpatient Medications  Medication Sig Dispense Refill  . acetaminophen (TYLENOL) 325 MG tablet Take 650 mg by mouth 3 (three) times daily.     Marland Kitchen aspirin EC 81 MG tablet Take 1 tablet (81 mg total) by mouth daily. 150 tablet 2  . atorvastatin (LIPITOR) 10 MG tablet Take 1 tablet (10 mg total) by mouth daily. 30 tablet 11  . Calcium Carbonate Antacid 600 MG chewable tablet Chew 2 tablets (1,200 mg total) by mouth daily. 90 tablet 3  . carboxymethylcellulose 1 % ophthalmic solution Apply 1 drop to eye 2 (two) times daily.    . cholecalciferol (VITAMIN D3) 25 MCG (1000 UNIT) tablet Take 1 tablet (1,000 Units total) by mouth daily. 1 tablet 3  . clopidogrel (PLAVIX) 75 MG tablet Take 1 tablet (75 mg total) by mouth daily. 30 tablet 11  . cyclobenzaprine (FLEXERIL) 10 MG tablet Take 10 mg by mouth at bedtime.    Marland Kitchen escitalopram (LEXAPRO) 5 MG tablet Take 5 mg by mouth daily.    Marland Kitchen gabapentin (NEURONTIN) 100 MG capsule Take 100 mg by mouth 3 (three) times daily.    Marland Kitchen gentamicin ointment (GARAMYCIN) 0.1 % Apply 1 application. topically 3 (three) times daily.    Marland Kitchen levothyroxine (SYNTHROID) 25 MCG tablet Take 1 tablet (25 mcg total) by mouth daily before breakfast. 30 tablet 1  . levothyroxine (SYNTHROID) 25 MCG tablet Take 1 tablet (25 mcg total) by mouth daily before breakfast. 30 tablet 1  . mirtazapine (REMERON) 7.5 MG tablet Take 7.5 mg by mouth at bedtime.    . Oxycodone HCl 10 MG TABS Take 10 mg by mouth every 6 (six) hours as needed for pain.    . pantoprazole (PROTONIX) 40 MG tablet Take 40 mg by mouth daily.    Marland Kitchen senna-docusate (SENOKOT-S) 8.6-50 MG tablet Take 1 tablet by mouth daily.    .  tamsulosin (FLOMAX) 0.4 MG CAPS capsule Take 1 capsule (0.4 mg total) by mouth daily after supper. 30 capsule 3   No current facility-administered medications for this visit.      Marland Kitchen  PHYSICAL EXAMINATION: ECOG PERFORMANCE STATUS: 1 - Symptomatic but completely ambulatory  There were no vitals filed for this visit.  There were  no vitals filed for this visit.    Physical Exam HENT:     Head: Normocephalic and atraumatic.     Mouth/Throat:     Pharynx: No oropharyngeal exudate.  Eyes:     Pupils: Pupils are equal, round, and reactive to light.  Cardiovascular:     Rate and Rhythm: Normal rate and regular rhythm.  Pulmonary:     Effort: No respiratory distress.     Breath sounds: No wheezing.  Abdominal:     General: Bowel sounds are normal. There is no distension.     Palpations: Abdomen is soft. There is no mass.     Tenderness: There is no abdominal tenderness. There is no guarding or rebound.  Musculoskeletal:        General: No tenderness. Normal range of motion.     Cervical back: Normal range of motion and neck supple.  Skin:    General: Skin is warm.     Comments: Bilateral lower extremity ulcerations noted.  Pulses intact bilaterally.  Cystlike lesion noted right elbow-nontender.  No erythema noted.  Neurological:     Mental Status: He is oriented to person, place, and time.     Comments: Sleepy but easily arousable.  Chronic weakness left upper and lower extremity.  Psychiatric:        Mood and Affect: Affect normal.     LABORATORY DATA:  I have reviewed the data as listed Lab Results  Component Value Date   WBC 5.9 03/17/2022   HGB 10.9 (L) 03/17/2022   HCT 34.5 (L) 03/17/2022   MCV 81.2 03/17/2022   PLT 175 03/17/2022   Recent Labs    02/03/22 0850 02/24/22 1231 03/17/22 1247  NA 136 137 137  K 3.7 4.1 3.8  CL 104 106 107  CO2 _0 GLUCOSE 124* 100* 114*  BUN _1 CREATININE 1.02 1.00 0.96  CALCIUM 8.4* 8.3* 8.2*  GFRNONAA >60  >60 >60  PROT 7.1 7.1 7.2  ALBUMIN 3.3* 3.3* 3.4*  AST 76* 39 32  ALT 44 23 18  ALKPHOS 114 117 109  BILITOT 0.5 0.3 0.4     RADIOGRAPHIC STUDIES: I have personally reviewed the radiological images as listed and agreed with the findings in the report. No results found.  ASSESSMENT & PLAN:   No problem-specific Assessment & Plan notes found for this encounter.   All questions were answered. The patient knows to call the clinic with any problems, questions or concerns.    Cammie Sickle, MD 04/07/2022 7:31 AM

## 2022-04-08 ENCOUNTER — Inpatient Hospital Stay (HOSPITAL_BASED_OUTPATIENT_CLINIC_OR_DEPARTMENT_OTHER): Payer: Medicaid Other | Admitting: Internal Medicine

## 2022-04-08 ENCOUNTER — Inpatient Hospital Stay: Payer: Medicaid Other

## 2022-04-08 ENCOUNTER — Inpatient Hospital Stay: Payer: Medicaid Other | Attending: Internal Medicine

## 2022-04-08 ENCOUNTER — Encounter: Payer: Self-pay | Admitting: Internal Medicine

## 2022-04-08 VITALS — BP 98/70 | HR 82 | Temp 96.3°F | Ht 69.0 in | Wt 159.5 lb

## 2022-04-08 DIAGNOSIS — C661 Malignant neoplasm of right ureter: Secondary | ICD-10-CM | POA: Insufficient documentation

## 2022-04-08 DIAGNOSIS — C187 Malignant neoplasm of sigmoid colon: Secondary | ICD-10-CM | POA: Insufficient documentation

## 2022-04-08 DIAGNOSIS — D509 Iron deficiency anemia, unspecified: Secondary | ICD-10-CM | POA: Diagnosis not present

## 2022-04-08 DIAGNOSIS — Z515 Encounter for palliative care: Secondary | ICD-10-CM

## 2022-04-08 DIAGNOSIS — Z7189 Other specified counseling: Secondary | ICD-10-CM

## 2022-04-08 DIAGNOSIS — Z5112 Encounter for antineoplastic immunotherapy: Secondary | ICD-10-CM | POA: Diagnosis present

## 2022-04-08 DIAGNOSIS — D500 Iron deficiency anemia secondary to blood loss (chronic): Secondary | ICD-10-CM

## 2022-04-08 DIAGNOSIS — C679 Malignant neoplasm of bladder, unspecified: Secondary | ICD-10-CM | POA: Diagnosis not present

## 2022-04-08 LAB — COMPREHENSIVE METABOLIC PANEL WITH GFR
ALT: 20 U/L (ref 0–44)
AST: 37 U/L (ref 15–41)
Albumin: 3.4 g/dL — ABNORMAL LOW (ref 3.5–5.0)
Alkaline Phosphatase: 110 U/L (ref 38–126)
Anion gap: 4 — ABNORMAL LOW (ref 5–15)
BUN: 20 mg/dL (ref 8–23)
CO2: 24 mmol/L (ref 22–32)
Calcium: 8.4 mg/dL — ABNORMAL LOW (ref 8.9–10.3)
Chloride: 109 mmol/L (ref 98–111)
Creatinine, Ser: 0.98 mg/dL (ref 0.61–1.24)
GFR, Estimated: >60 mL/min
Glucose, Bld: 129 mg/dL — ABNORMAL HIGH (ref 70–99)
Potassium: 3.8 mmol/L (ref 3.5–5.1)
Sodium: 137 mmol/L (ref 135–145)
Total Bilirubin: 0.6 mg/dL (ref 0.3–1.2)
Total Protein: 7.2 g/dL (ref 6.5–8.1)

## 2022-04-08 LAB — CBC WITH DIFFERENTIAL/PLATELET
Abs Immature Granulocytes: 0.02 10*3/uL (ref 0.00–0.07)
Basophils Absolute: 0.1 10*3/uL (ref 0.0–0.1)
Basophils Relative: 1 %
Eosinophils Absolute: 0.4 10*3/uL (ref 0.0–0.5)
Eosinophils Relative: 5 %
HCT: 37.8 % — ABNORMAL LOW (ref 39.0–52.0)
Hemoglobin: 12 g/dL — ABNORMAL LOW (ref 13.0–17.0)
Immature Granulocytes: 0 %
Lymphocytes Relative: 17 %
Lymphs Abs: 1.1 10*3/uL (ref 0.7–4.0)
MCH: 25.4 pg — ABNORMAL LOW (ref 26.0–34.0)
MCHC: 31.7 g/dL (ref 30.0–36.0)
MCV: 80.1 fL (ref 80.0–100.0)
Monocytes Absolute: 0.4 10*3/uL (ref 0.1–1.0)
Monocytes Relative: 6 %
Neutro Abs: 4.7 10*3/uL (ref 1.7–7.7)
Neutrophils Relative %: 71 %
Platelets: 160 10*3/uL (ref 150–400)
RBC: 4.72 MIL/uL (ref 4.22–5.81)
RDW: 15.5 % (ref 11.5–15.5)
WBC: 6.6 10*3/uL (ref 4.0–10.5)
nRBC: 0 % (ref 0.0–0.2)

## 2022-04-08 MED ORDER — SODIUM CHLORIDE 0.9 % IV SOLN
200.0000 mg | Freq: Once | INTRAVENOUS | Status: AC
  Administered 2022-04-08: 200 mg via INTRAVENOUS
  Filled 2022-04-08: qty 200

## 2022-04-08 MED ORDER — SODIUM CHLORIDE 0.9 % IV SOLN
Freq: Once | INTRAVENOUS | Status: AC
  Filled 2022-04-08: qty 250

## 2022-04-08 MED ORDER — HEPARIN SOD (PORK) LOCK FLUSH 100 UNIT/ML IV SOLN
500.0000 [IU] | Freq: Once | INTRAVENOUS | Status: AC | PRN
  Administered 2022-04-08: 500 [IU]
  Filled 2022-04-08: qty 5

## 2022-04-08 MED ORDER — SODIUM CHLORIDE 0.9 % IV SOLN
200.0000 mg | Freq: Once | INTRAVENOUS | Status: AC
  Administered 2022-04-08: 200 mg via INTRAVENOUS
  Filled 2022-04-08: qty 8

## 2022-04-08 NOTE — Progress Notes (Signed)
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  Is alone/in a wheelchair.  Chad [name] 61 y.o.  male above history of stage IV-ureteral cancer/history of stage III colon cancer right side; recurrent sigmoid colon cancer [un-resected/under surveillance] and multiple other comorbidities currently on Keytruda is here for follow-up.  Denies any worsening abdominal pain.  Any nausea vomiting or constipation.  No blood in stools or black-colored stools.  No hematuria.  Chronic shortness of  breath.   Review of Systems  Constitutional:  Positive for malaise/fatigue. Negative for chills, diaphoresis, fever and weight loss.  HENT:  Negative for nosebleeds and sore throat.   Eyes:  Negative for double vision.  Respiratory:  Positive for cough and shortness of breath. Negative for hemoptysis and wheezing.   Cardiovascular:  Negative for chest pain, palpitations and orthopnea.  Gastrointestinal:  Positive for constipation. Negative for abdominal pain, blood in stool, diarrhea, heartburn, melena, nausea and vomiting.  Genitourinary:  Negative for dysuria, frequency and urgency.  Musculoskeletal:  Positive for back pain and joint pain.  Skin: Negative.  Negative for itching and rash.  Neurological:  Positive for focal weakness. Negative for dizziness, tingling, weakness and headaches.       Chronic left-sided weakness upper than lower extremity.  Endo/Heme/Allergies:  Does not bruise/bleed easily.  Psychiatric/Behavioral:  Negative for depression. The patient is not nervous/anxious and does not have insomnia.      MEDICAL HISTORY:  Past Medical History:  Diagnosis Date   Anemia    Anxiety    ARF (acute respiratory failure) (HCC)    Bladder cancer (HCC)    COPD (chronic obstructive pulmonary disease) (HCC)    Depression    Dysphagia    Family history of colon cancer    Family history of kidney cancer    Family history of leukemia    Family history of prostate cancer    GERD (gastroesophageal reflux disease)    Hepatitis    chronic hep c   Hydronephrosis    Hydronephrosis with ureteral stricture    Hyperlipidemia    Knee pain    Left   Malignant neoplasm   of colon [facility])    Nerve pain    Peripheral vascular disease ([place])    Prostate cancer ([place])    Stroke ([place])    Ureteral cancer, right ([place])    Urinary frequency    Venous hypertension of both lower extremities     SURGICAL HISTORY: Past Surgical History:  Procedure Laterality Date   COLON SURGERY     En bloc  extended right hemicolectomy 07/2017   COLONOSCOPY WITH PROPOFOL N/A 11/06/2020   Procedure: COLONOSCOPY WITH PROPOFOL;  Surgeon: Jonathon Bellows, MD;  Location[facility] ENDOSCOPY;  Service: Gastroenterology;  Laterality: N/A;   COLONOSCOPY WITH PROPOFOL N/A 07/31/2021   Procedure: COLONOSCOPY WITH PROPOFOL;  Surgeon: Karan Bellow, MD;  Location: [facility] ENDOSCOPY;  Service: Endoscopy;  Laterality: N/A;   CYSTOSCOPY W/ RETROGRADES Right 08/30/2018   Procedure: CYSTOSCOPY WITH RETROGRADE PYELOGRAM;  Surgeon: Hollice Espy, MD;  Location: [facility] ORS;  Service: Urology;  Laterality: Right;   CYSTOSCOPY WITH STENT PLACEMENT Right 04/25/2018   Procedure: CYSTOSCOPY WITH STENT PLACEMENT;  Surgeon: Hollice Espy, MD;  Location: [facility] ORS;  Service: Urology;  Laterality: Right;   CYSTOSCOPY WITH STENT PLACEMENT Right 08/30/2018   Procedure: [place];  Surgeon: Hollice Espy, MD;  Location: [facility] ORS;  Service: Urology;  Laterality: Right;   CYSTOSCOPY WITH STENT PLACEMENT Right 03/07/2019   Procedure: CYSTOSCOPY WITH STENT [place];  Surgeon: Hollice Espy, MD;  Location: [facility] ORS;  Service: Urology;  Laterality: Right;   CYSTOSCOPY WITH STENT PLACEMENT Right 11/21/2019   Procedure: CYSTOSCOPY WITH STENT [place];  Surgeon: Hollice Espy, MD;  Location: [facility] ORS;  Service: Urology;  Laterality: Right;   LOWER EXTREMITY ANGIOGRAPHY Left 05/23/2019   Procedure: LOWER EXTREMITY ANGIOGRAPHY;  Surgeon: Algernon Huxley, MD;  Location: [place] CV LAB;  Service: Cardiovascular;  Laterality: Left;   LOWER EXTREMITY ANGIOGRAPHY Right 05/30/2019   Procedure: LOWER EXTREMITY ANGIOGRAPHY;  Surgeon: Algernon Huxley, MD;  Location: [place] CV LAB;  Service: Cardiovascular;  Laterality: Right;   LOWER EXTREMITY ANGIOGRAPHY Right 02/13/2020   Procedure: LOWER EXTREMITY ANGIOGRAPHY;  Surgeon: Algernon Huxley, MD;  Location: [place] CV LAB;  Service: Cardiovascular;  Laterality: Right;   LOWER  EXTREMITY ANGIOGRAPHY Left 02/20/2020   Procedure: LOWER EXTREMITY ANGIOGRAPHY;  Surgeon: Algernon Huxley, MD;  Location: [place] CV LAB;  Service: Cardiovascular;  Laterality: Left;   PORTA CATH INSERTION N/A 02/28/2019   Procedure: PORTA CATH INSERTION;  Surgeon: Algernon Huxley, MD;  Location: [place] CV LAB;  Service: Cardiovascular;  Laterality: N/A;   tumor removed       SOCIAL HISTORY: Social History   Socioeconomic History   Marital status: Single    Spouse name: Not on file   Number of children: Not on file   Years of education: Not on file   Highest education level: Not on file  Occupational History   Not on file  Tobacco Use   Smoking status: Every Day    Packs/day: 1.00    Types: Cigarettes   Smokeless tobacco: Never  Vaping Use   Vaping Use: Never used  Substance and Sexual Activity   Alcohol use: Not Currently   Drug use: Not Currently   Sexual activity: Not Currently  Other Topics Concern   Not on file  Social History Narrative    used to live [place]; moved  To [place]- end of April 2019; in Nursing home; 1pp/day; quit alcohol. 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  Family- dad- prostate ca [at 3y]; brother- 60 died of prostate cancer; brother- 77- no cancers [[place]]; sonGerald Stabs [[place]];[name]-32y prostate ca [facility] [place]]; daughter- 67 [[place]]; another daughter 39 [[place]/addict]. will refer genetics counseling. Given MSI- abnormal; highly suspicious of Lynch syndrome.  Patient's son [name] aware of high possible lynch syndrome.   Social Determinants of Health   Financial Resource Strain: Not on file  Food Insecurity: Not on file  Transportation Needs: Not on file  Physical Activity: Not on file  Stress: Not on file  Social Connections: Not on file  Intimate Partner Violence: Not on file    FAMILY HISTORY: Family History  Problem Relation Age of Onset   Prostate cancer Father 80   Cancer Brother 85       unsure type   Cancer Paternal Uncle         unsure type   Cancer Maternal Grandmother        unsure type   Cancer Paternal Grandmother        unsure type   Kidney cancer Paternal Grandfather    Cancer Other        unsure types   Leukemia Son    Cancer Son        other cancers, possibly colon    ALLERGIES:  is allergic to penicillins.  MEDICATIONS:  Current Outpatient Medications  Medication Sig Dispense Refill   acetaminophen (TYLENOL) 325 MG tablet Take 650 mg by mouth 3 (three) times daily.      aspirin EC 81 MG tablet Take 1 tablet (81 mg total) by mouth daily. 150 tablet 2   Calcium Carbonate Antacid 600 MG chewable tablet Chew 2 tablets (1,200 mg total) by mouth daily. 90 tablet 3   carboxymethylcellulose 1 % ophthalmic solution Apply 1 drop to eye 2 (two) times daily.     cholecalciferol (VITAMIN D3) 25 MCG (1000 UNIT) tablet Take 1 tablet (1,000 Units total) by mouth daily. 1 tablet 3   clopidogrel (PLAVIX) 75 MG tablet Take 1 tablet (75 mg total) by mouth daily. 30 tablet 11   cyclobenzaprine (FLEXERIL) 10 MG tablet Take 10 mg by mouth at bedtime.     escitalopram (LEXAPRO) 5 MG tablet Take 5 mg by mouth daily.     gabapentin (NEURONTIN) 100 MG capsule Take 100 mg by mouth 3 (three) times daily.     gentamicin ointment (GARAMYCIN) 0.1 % Apply 1 application. topically 3 (three) times daily.     mirtazapine (REMERON) 7.5 MG tablet Take 7.5 mg by mouth at bedtime.     Oxycodone HCl 10 MG TABS Take 10 mg by mouth every 6 (six) hours as needed for pain.     pantoprazole (PROTONIX) 40 MG tablet Take 40 mg by mouth daily.     senna-docusate (SENOKOT-S) 8.6-50 MG tablet Take 1 tablet by mouth daily.     tamsulosin (FLOMAX) 0.4 MG CAPS capsule Take 1 capsule (0.4 mg total) by mouth daily after supper. 30 capsule 3   atorvastatin (LIPITOR) 10 MG tablet Take 1 tablet (10 mg total) by mouth daily. 30 tablet 11   levothyroxine (SYNTHROID) 25 MCG tablet Take 1 tablet (25 mcg total) by mouth daily before breakfast. 30 tablet 1    levothyroxine (SYNTHROID) 25 MCG tablet Take 1 tablet (25 mcg total) by mouth daily before breakfast. 30 tablet 1   No current facility-administered medications for this visit.      Marland Kitchen  PHYSICAL EXAMINATION: ECOG PERFORMANCE STATUS: 1 - Symptomatic but  completely ambulatory  Vitals:   04/08/22 0844  BP: 98/70  Pulse: 82  Temp: (!) 96.3 F (35.7 C)  SpO2: 95%   Filed Weights   04/08/22 0844  Weight: 159 lb 8 oz (72.3 kg)      Physical Exam HENT:     Head: Normocephalic and atraumatic.     Mouth/Throat:     Pharynx: No oropharyngeal exudate.  Eyes:     Pupils: Pupils are equal, round, and reactive to light.  Cardiovascular:     Rate and Rhythm: Normal rate and regular rhythm.  Pulmonary:     Effort: No respiratory distress.     Breath sounds: No wheezing.  Abdominal:     General: Bowel sounds are normal. There is no distension.     Palpations: Abdomen is soft. There is no mass.     Tenderness: There is no abdominal tenderness. There is no guarding or rebound.  Musculoskeletal:        General: No tenderness. Normal range of motion.     Cervical back: Normal range of motion and neck supple.  Skin:    General: Skin is warm.     Comments: Bilateral lower extremity ulcerations noted.  Pulses intact bilaterally.  Cystlike lesion noted right elbow-nontender.  No erythema noted.  Neurological:     Mental Status: He is oriented to person, place, and time.     Comments: Sleepy but easily arousable.  Chronic weakness left upper and lower extremity.  Psychiatric:        Mood and Affect: Affect normal.      LABORATORY DATA:  I have reviewed the data as listed Lab Results  Component Value Date   WBC 6.6 04/08/2022   HGB 12.0 (L) 04/08/2022   HCT 37.8 (L) 04/08/2022   MCV 80.1 04/08/2022   PLT 160 04/08/2022   Recent Labs    02/24/22 1231 03/17/22 1247 04/08/22 0838  NA 137 137 137  K 4.1 3.8 3.8  CL 106 107 109  CO2 _0 GLUCOSE 100* 114* 129*  BUN  _1 CREATININE 1.00 0.96 0.98  CALCIUM 8.3* 8.2* 8.4*  GFRNONAA >60 >60 >60  PROT 7.1 7.2 7.2  ALBUMIN 3.3* 3.4* 3.4*  AST 39 32 37  ALT _2 ALKPHOS 117 109 110  BILITOT 0.3 0.4 0.6    RADIOGRAPHIC STUDIES: I have personally reviewed the radiological images as listed and agreed with the findings in the report. No results found.  ASSESSMENT & PLAN:   Ureteral cancer, right ([place]) # High-grade urothelial cancer/cytology; likely of the right renal pelvis /upper ureter. On keytruda-CT scan- JUNE 30th, 2023- Stable a thickening of the right ureteral area- no obvious progression of disease noted on imaging.  STABLE.   # Proceed with  [place] today; Labs today reviewed; Labs today reviewed;  acceptable for treatment today. STABLE. TSH AUG 2023--WNL.  Proceed with repeating a CT scan prior to next visit/3 weeks; ordered today.  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  JUNE 30th, 2023 CT scan- Focal wall thickening in the proximal sigmoid colon; pericolonic lymph nodes overall stable.  I again reviewed with the patient that he is a poor candidate for surgery; s/p evaluation with Dr. Bary Castilla. STABLE.   # Iron deficiency anemia-hemoglobin 11-12; FEB 2023- iron studies-7/ferritin-18 LOW; proceed with venofer.   # Hypocalcemia- Mild- on Ca+vit D; JUNE 2023- 25-OH vit D levels:  30. STABLE.    #DISPOSITION:  #  Keytruda; venofer today  # follow up in 3 weeks-; MD- labs-cbc/cmp; Keytruda; CT CAP prior-Dr.B.     All questions were answered. The patient knows to call the clinic with any problems, questions or concerns.    Cammie Sickle, MD 04/08/2022 12:53 PM

## 2022-04-08 NOTE — Progress Notes (Signed)
No concerns. 

## 2022-04-08 NOTE — Patient Instructions (Signed)
[facility]  Discharge Instructions: Thank you for choosing [facility] to provide your oncology and hematology care.  If you have a lab appointment with the Cancer Center, please go directly to the Cancer Center and check in at the registration area.  Wear comfortable clothing and clothing appropriate for easy access to any Portacath or PICC line.   We strive to give you quality time with your provider. You may need to reschedule your appointment if you arrive late (15 or more minutes).  Arriving late affects you and other patients whose appointments are after yours.  Also, if you miss three or more appointments without notifying the office, you may be dismissed from the clinic at the provider's discretion.      For prescription refill requests, have your pharmacy contact our office and allow 72 hours for refills to be completed.       To help prevent nausea and vomiting after your treatment, we encourage you to take your nausea medication as directed.  BELOW ARE SYMPTOMS THAT SHOULD BE REPORTED IMMEDIATELY: *FEVER GREATER THAN 100.4 F (38 C) OR HIGHER *CHILLS OR SWEATING *NAUSEA AND VOMITING THAT IS NOT CONTROLLED WITH YOUR NAUSEA MEDICATION *UNUSUAL SHORTNESS OF BREATH *UNUSUAL BRUISING OR BLEEDING *URINARY PROBLEMS (pain or burning when urinating, or frequent urination) *BOWEL PROBLEMS (unusual diarrhea, constipation, pain near the anus) TENDERNESS IN MOUTH AND THROAT WITH OR WITHOUT PRESENCE OF ULCERS (sore throat, sores in mouth, or a toothache) UNUSUAL RASH, SWELLING OR PAIN  UNUSUAL VAGINAL DISCHARGE OR ITCHING   Items with * indicate a potential emergency and should be followed up as soon as possible or go to the Emergency Department if any problems should occur.  Please show the CHEMOTHERAPY ALERT CARD or IMMUNOTHERAPY ALERT CARD at check-in to the Emergency Department and triage nurse.  Should you have questions after your  visit or need to cancel or reschedule your appointment, please contact [facility]  [phone] and follow the prompts.  Office hours are 8:00 a.m. to 4:30 p.m. Monday - Friday. Please note that voicemails left after 4:00 p.m. may not be returned until the following business day.  We are closed weekends and major holidays. You have access to a nurse at all times for urgent questions. Please call the main number to the clinic [phone] and follow the prompts.  For any non-urgent questions, you may also contact your provider using MyChart. We now offer e-Visits for anyone 18 and older to request care online for non-urgent symptoms. For details visit [email or web address].   Also download the MyChart app! Go to the app store, search "MyChart", open the app, select , and log in with your MyChart username and password.  Masks are optional in the cancer centers. If you would like for your care team to wear a mask while they are taking care of you, please let them know. For doctor visits, patients may have with them one support person who is at least 63 years old. At this time, visitors are not allowed in the infusion area.   

## 2022-04-08 NOTE — Progress Notes (Signed)
ON PATHWAY REGIMEN - Bladder  No Change  Continue With Treatment as Ordered.  Original Decision Date/Time: 06/15/2018 08:05     A cycle is 21 days:     Pembrolizumab   **Always confirm dose/schedule in your pharmacy ordering system**  Patient Characteristics: Metastatic Disease, First Line, No Prior Neoadjuvant/Adjuvant Therapy, Poor Renal Function (CrCl < 50 mL/min), Unknown PD-L1 Expression AJCC M Category: M1 AJCC N Category: N1 AJCC T Category: pT2b Current evidence of distant metastases<= Yes AJCC 8 Stage Grouping: Unknown Line of Therapy: First Line Prior Neoadjuvant/Adjuvant Therapy<= No Renal Function: Poor Renal Function (CrCl < 50 mL/min) PD-L1 Expression Status: Unknown PD-L1 Expression Intent of Therapy: Non-Curative / Palliative Intent, Discussed with Patient

## 2022-04-08 NOTE — Assessment & Plan Note (Addendum)
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 On keytruda-CT scan- JUNE 30th, 2023- Stable a thickening of the right ureteral area- no obvious progression of disease noted on imaging.  STABLE.   # Proceed with  [place] today; Labs today reviewed; Labs today reviewed;  acceptable for treatment today. STABLE. TSH AUG 2023--WNL.  Proceed with repeating a CT scan prior to next visit/3 weeks; ordered today.  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  JUNE 30th, 2023 CT scan- Focal wall thickening in the proximal sigmoid colon; pericolonic lymph nodes overall stable.  I again reviewed with the patient that he is a poor candidate for surgery; s/p evaluation with Dr. Bary Castilla. STABLE.   # Iron deficiency anemia-hemoglobin 11-12; FEB 2023- iron studies-7/ferritin-18 LOW; proceed with venofer.   # Hypocalcemia- Mild- on Ca+vit D; JUNE 2023- 25-OH vit D levels: 30. STABLE.    #DISPOSITION:  #  Keytruda; venofer today  # follow up in 3 weeks-; MD- labs-cbc/cmp; Keytruda; CT CAP prior-Dr.B.

## 2022-04-10 ENCOUNTER — Ambulatory Visit: Payer: Medicaid Other | Admitting: Physician Assistant

## 2022-04-14 ENCOUNTER — Encounter: Payer: Self-pay | Admitting: Nurse Practitioner

## 2022-04-14 ENCOUNTER — Non-Acute Institutional Stay: Payer: Medicaid Other | Admitting: Nurse Practitioner

## 2022-04-14 VITALS — BP 124/80 | HR 76 | Temp 98.7°F | Resp 20 | Wt 156.8 lb

## 2022-04-14 DIAGNOSIS — C661 Malignant neoplasm of right ureter: Secondary | ICD-10-CM

## 2022-04-14 DIAGNOSIS — Z720 Tobacco use: Secondary | ICD-10-CM

## 2022-04-14 DIAGNOSIS — G4700 Insomnia, unspecified: Secondary | ICD-10-CM

## 2022-04-14 DIAGNOSIS — R630 Anorexia: Secondary | ICD-10-CM

## 2022-04-14 DIAGNOSIS — G894 Chronic pain syndrome: Secondary | ICD-10-CM

## 2022-04-14 DIAGNOSIS — Z515 Encounter for palliative care: Secondary | ICD-10-CM

## 2022-04-14 NOTE — Progress Notes (Signed)
[employer] [facility] Palliative Care Consult Note Telephone: [phone]  Fax: [phone]    Date of encounter: 04/14/22 1:31 PM PATIENT NAME: Chad [name] [id number]   [id number] (home)  DOB: 11-12-1960 MRN: [id number] PRIMARY CARE PROVIDER:   [place], [place],  [address] [place] [address] [phone]  RESPONSIBLE PARTY:    Contact Information     Name Relation Home Work [place], [place] Son   [phone]         I met face to face with patient in facility. Palliative Care was asked to follow this patient by consultation request of  Ben [place] to address advance care planning and complex medical decision making. This is a follow up visit.                                  ASSESSMENT AND PLAN / RECOMMENDATIONS: Symptom Management/Plan: 1. ACP: DNR; treat what is treatable including hospitalizations if necessary   2.Anorexia decline secondary to cancer related to urothelial cancer, prostate cancer improving ongoing.  12/30/2021 weight 148.8 lbs 02/03/2022 weight 154.5 lbs 03/13/2022 weight 158.6 lbs 04/01/2022 weight 156.8 lbs 3. Chronic pain reviewed; reviewed chronic pain, medications reviewed; Mr. Golab endorses manageable with current oxycodone, discussed and educated continue to monitor on pain scale, monitor efficacy vs adverse side effects.Will continue to monitor; encouraged to ask when in pain, Continue to follow by psychotherapy and psychiatry for counseling; supportive services.    4. Insomnia reviewed sleep patterns, discussed sleep hygiene.    5. Palliative care encounter; Palliative medicine team will continue to support patient, patient's family, and medical team. Visit consisted of counseling and education dealing with the complex and emotionally intense issues of symptom management and palliative care in the setting of serious and  potentially life-threatening illness  6. Smoking cessation; reviewed and declined.           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 Return 8 weeks or prn. I spent 47 minutes providing this consultation starting at 1:30 pm. More than 50% of the time in this consultation was  spent in counseling and care coordination. PPS: 50% Chief Complaint: Follow up palliative consult for complex medical decision making HISTORY OF PRESENT ILLNESS:  Chad [name] is a 61 y.o. year old male  with multiple medical problems including   late onset CVA, urothelial cancer, prostate cancer, chronic hepatitis C, hyperlipidemia, colon surgery, protein calorie malnutrition, anxiety, depression. Last Oncology visit 03/17/2022 noted CT scan 01/24/2022 stable thickening of right ureteral area, no  obvious progression of disease noted on imaging for high grade urothelial cancer; on Keytruda; proceeded with Keytruda infusion; received Venofer at this visit; calcium started with vitamin D and levothyroxine 71mg was started; Mr. LProvencecontinues [facility]. Mr. LBaranektransfers himself to w/c, requires assistance for ADL's. Staff endorses Mr LGullicksoncontinues to go to smoking patio daily to smoke, appetite has been good as he is able to feed himself. At present Mr. LPoageis sitting in the dining area with other residents. Mr. LKetcherasked to speak with palliative provider for visit while waiting on his lunch. Mr LPrittendorses he is doing well. Declined smoking cessation, only thing that brings him joy. Appetite is good, no new problems. Mr LTortorellaand I talked about Oncology appointment with pending diagnostic testing. We talked about what that means, biologics and impact on cancer. We talked about functional abilities, ros, insomnia, sleep patterns, sleep hygiene. Medical goals reviewed, no new changes, questions answered. Emotional support, updated staff.    History obtained from review of EMR, discussion with staff and Mr. LMarkoff  I reviewed available labs, medications, imaging, studies and related documents from the EMR.  Records reviewed and summarized above.    ROS 10 point system reviewed all negative except HPI   Exam Constitutional: NAD General: pleasant male EYES: lids intact ENMT: oral mucous membranes moist CV: S1S2, RRR Pulmonary: breath sounds clear Abdomen: soft and non tender MSK: w/d dependent, +Edema BLE Skin: warm and dry Neuro:  left hemiplegia,  no cognitive impairment Psych: non-anxious affect, A and O x 3  Thank you for the opportunity to participate in the care of Mr. LFessel  The palliative care team will continue to follow. Please call our office at [phone] we  can be of additional assistance.   Aysia Lowder ZIhor Gully NP

## 2022-04-17 ENCOUNTER — Ambulatory Visit
Admission: RE | Admit: 2022-04-17 | Discharge: 2022-04-17 | Disposition: A | Discharge status: Home / Self Care | Payer: Medicaid Other | Source: Ambulatory Visit | Attending: Internal Medicine | Admitting: Internal Medicine

## 2022-04-17 DIAGNOSIS — C661 Malignant neoplasm of right ureter: Secondary | ICD-10-CM | POA: Diagnosis not present

## 2022-04-17 MED ORDER — IOHEXOL 300 MG/ML  SOLN
100.0000 mL | Freq: Once | INTRAMUSCULAR | Status: AC | PRN
  Administered 2022-04-17: 100 mL via INTRAVENOUS

## 2022-04-21 ENCOUNTER — Encounter: Payer: Medicaid Other | Attending: Physician Assistant | Admitting: Physician Assistant

## 2022-04-21 DIAGNOSIS — L299 Pruritus, unspecified: Secondary | ICD-10-CM | POA: Insufficient documentation

## 2022-04-21 DIAGNOSIS — I69354 Hemiplegia and hemiparesis following cerebral infarction affecting left non-dominant side: Secondary | ICD-10-CM | POA: Diagnosis not present

## 2022-04-21 DIAGNOSIS — I739 Peripheral vascular disease, unspecified: Secondary | ICD-10-CM | POA: Diagnosis not present

## 2022-04-21 DIAGNOSIS — L97322 Non-pressure chronic ulcer of left ankle with fat layer exposed: Secondary | ICD-10-CM | POA: Insufficient documentation

## 2022-04-21 DIAGNOSIS — B1920 Unspecified viral hepatitis C without hepatic coma: Secondary | ICD-10-CM | POA: Insufficient documentation

## 2022-04-21 DIAGNOSIS — G629 Polyneuropathy, unspecified: Secondary | ICD-10-CM | POA: Insufficient documentation

## 2022-04-21 DIAGNOSIS — I872 Venous insufficiency (chronic) (peripheral): Secondary | ICD-10-CM | POA: Diagnosis not present

## 2022-04-21 DIAGNOSIS — I2510 Atherosclerotic heart disease of native coronary artery without angina pectoris: Secondary | ICD-10-CM | POA: Insufficient documentation

## 2022-04-21 DIAGNOSIS — M1990 Unspecified osteoarthritis, unspecified site: Secondary | ICD-10-CM | POA: Insufficient documentation

## 2022-04-21 DIAGNOSIS — J449 Chronic obstructive pulmonary disease, unspecified: Secondary | ICD-10-CM | POA: Insufficient documentation

## 2022-04-21 NOTE — Progress Notes (Addendum)
Chad, [name] ([id number]) Visit Report for 04/21/2022 Arrival Information Details Patient Name: Chad [name], Chad [name] Date of Service: 04/21/2022 10:30 AM Medical Record Number: [id number] Patient Account Number: [id number] Date of Birth/Sex: [birth year] (61 y.o. M) Treating RN: Cornell Barman Primary Care Alishba [place]: [name] Other Clinician: [name] Referring Lourene Hoston: [name] Treating Otis Burress/Extender: Skipper Cliche in Treatment: 19 Visit Information History Since Last Visit All ordered tests and consults were completed: No Patient Arrived: Wheel Chair Added or deleted any medications: No Arrival Time: 10:36 Any new allergies or adverse reactions: No Transfer Assistance: EasyPivot Patient Lift Had a fall or experienced change in No Patient Requires Transmission-Based No activities of daily living that may affect Precautions: risk of falls: Patient Has Alerts: Yes Hospitalized since last visit: No Patient Alerts: Patient on Blood Thinner Pain Present Now: No NOT diabetic aspirin [facility]m [facility] Electronic Signature(s) Signed: 04/21/2022 5:09:23 PM By: VMassie KluverEntered By: VMassie Kluveron 04/21/2022 10:44:58 Chad [name]([id number] -------------------------------------------------------------------------------- Clinic Level of Care Assessment Details Patient Name: Chad SabalDate of Service: 04/21/2022 10:30 AM Medical Record Number: [id number] Account Number: [id number] of Birth/Sex: 304-May-1962(61 y.o. M) Treating RN: WCornell BarmanPrimary Care Kurt Hoffmeier: LErik ObeyOther Clinician: VMassie KluverReferring Krishav Mamone: LErik ObeyTreating Haskel Dewalt/Extender: SSkipper Clichein Treatment: 19 Clinic Level of Care Assessment Items TOOL 4 Quantity Score '[]'  - Use when only an EandM is performed on FOLLOW-UP visit 0 ASSESSMENTS - Nursing Assessment / Reassessment X - Reassessment of Co-morbidities (includes updates  in patient status) 1 10 X- 1 5 Reassessment of Adherence to Treatment Plan ASSESSMENTS - Wound and Skin Assessment / Reassessment X - Simple Wound Assessment / Reassessment - one wound 1 5 '[]'  - 0 Complex Wound Assessment / Reassessment - multiple wounds '[]'  - 0 Dermatologic / Skin Assessment (not related to wound area) ASSESSMENTS - Focused Assessment '[]'  - Circumferential Edema Measurements - multi extremities 0 '[]'  - 0 Nutritional Assessment / Counseling / Intervention '[]'  - 0 Lower Extremity Assessment (monofilament, tuning fork, pulses) '[]'  - 0 Peripheral Arterial Disease Assessment (using hand held doppler) ASSESSMENTS - Ostomy and/or Continence Assessment and Care '[]'  - Incontinence Assessment and Management 0 '[]'  - 0 Ostomy Care Assessment and Management (repouching, etc.) PROCESS - Coordination of Care X - Simple Patient / Family Education for ongoing care 1 15 '[]'  - 0 Complex (extensive) Patient / Family Education for ongoing care '[]'  - 0 Staff obtains CProgrammer, systems Records, Test Results / Process Orders '[]'  - 0 Staff telephones HHA, Nursing Homes / Clarify orders / etc '[]'  - 0 Routine Transfer to another Facility (non-emergent condition) '[]'  - 0 Routine Hospital Admission (non-emergent condition) '[]'  - 0 New Admissions / IBiomedical engineer/ Ordering NPWT, Apligraf, etc. '[]'  - 0 Emergency Hospital Admission (emergent condition) X- 1 10 Simple Discharge Coordination '[]'  - 0 Complex (extensive) Discharge Coordination PROCESS - Special Needs '[]'  - Pediatric / Minor Patient Management 0 '[]'  - 0 Isolation Patient Management '[]'  - 0 Hearing / Language / Visual special needs '[]'  - 0 Assessment of Community assistance (transportation, D/C planning, etc.) '[]'  - 0 Additional assistance / Altered mentation '[]'  - 0 Support Surface(s) Assessment (bed, cushion, seat, etc.) INTERVENTIONS - Wound Cleansing / Measurement Burgard, Bryson ([id number] X- 1 5 Simple Wound Cleansing - one  wound '[]'  - 0 Complex Wound Cleansing - multiple wounds X- 1 5 Wound Imaging (photographs - any number of wounds) '[]'  - 0 Wound Tracing (instead of photographs) X- 1 5  Simple Wound Measurement - one wound '[]'  - 0 Complex Wound Measurement - multiple wounds INTERVENTIONS - Wound Dressings '[]'  - Small Wound Dressing one or multiple wounds 0 X- 1 15 Medium Wound Dressing one or multiple wounds '[]'  - 0 Large Wound Dressing one or multiple wounds X- 1 5 Application of Medications - topical '[]'  - 0 Application of Medications - injection INTERVENTIONS - Miscellaneous '[]'  - External ear exam 0 '[]'  - 0 Specimen Collection (cultures, biopsies, blood, body fluids, etc.) '[]'  - 0 Specimen(s) / Culture(s) sent or taken to Lab for analysis '[]'  - 0 Patient Transfer (multiple staff / Civil Service fast streamer / Similar devices) '[]'  - 0 Simple Staple / Suture removal (25 or less) '[]'  - 0 Complex Staple / Suture removal (26 or more) '[]'  - 0 Hypo / Hyperglycemic Management (close monitor of Blood Glucose) '[]'  - 0 Ankle / Brachial Index (ABI) - do not check if billed separately X- 1 5 Vital Signs Has the patient been seen at the hospital within the last three years: Yes Total Score: 85 Level Of Care: New/Established - Level 3 Electronic Signature(s) Signed: 04/21/2022 5:09:23 PM By: [name] Entered By: [name] on 04/21/2022 11:09:08 [name] ([id number]) -------------------------------------------------------------------------------- Encounter Discharge Information Details Patient Name: [name] Date of Service: 04/21/2022 10:30 AM Medical Record Number: [id number] Patient Account Number: [id number] Date of Birth/Sex: [birth year] (61 y.o. M) Treating RN: Cornell Barman Primary Care Michaela Broski: [name] Other Clinician: [name] Referring Aleza Pew: [name] Treating Sarena Jezek/Extender: Skipper Cliche in Treatment: 19 Encounter Discharge Information Items Discharge  Condition: Stable Ambulatory Status: Wheelchair Discharge Destination: Skilled Nursing Facility Telephoned: Yes Orders Sent: Yes Transportation: Other Accompanied By: self Schedule Follow-up Appointment: Yes Clinical Summary of Care: Electronic Signature(s) Signed: 04/21/2022 5:09:23 PM By: [name] Entered By: [name] on 04/21/2022 11:28:35 [name] ([id number]) -------------------------------------------------------------------------------- Lower Extremity Assessment Details Patient Name: [name] Date of Service: 04/21/2022 10:30 AM Medical Record Number: [id number] Patient Account Number: [id number] Date of Birth/Sex: 1961/02/25 (61 y.o. M) Treating RN: Cornell Barman Primary Care Brittney Mucha: [name] Other Clinician: [name] Referring Fareeha Evon: [name] Treating Kaylen Motl/Extender: Skipper Cliche in Treatment: 19 Electronic Signature(s) Signed: 04/21/2022 5:09:23 PM By: [name] Signed: 04/21/2022 5:56:59 PM By: Gretta Cool, BSN, RN, CWS, Kim RN, BSN Entered By: [name] on 04/21/2022 10:52:25 TORRIS, HOUSE ([id number]) -------------------------------------------------------------------------------- Multi Wound Chart Details Patient Name: [name] Date of Service: 04/21/2022 10:30 AM Medical Record Number: [id number] Patient Account Number: [id number] Date of Birth/Sex: 1961/05/19 (61 y.o. M) Treating RN: Cornell Barman Primary Care Lakara Weiland: [name] Other Clinician: [name] Referring Birl Lobello: [name] Treating Lashawne Dura/Extender: Skipper Cliche in Treatment: 19 Vital Signs Height(in): 20 Pulse(bpm): 7 Weight(lbs): 150 Blood Pressure(mmHg): 145/89 Body Mass Index(BMI): 23.5 Temperature(F): 98.2 Respiratory Rate(breaths/min): 18 Photos: [N/A:N/A] Wound Location: Left, Distal, Medial Ankle N/A N/A Wounding Event: Gradually Appeared N/A N/A Primary Etiology: Venous Leg Ulcer N/A N/A Comorbid  History: Anemia, Chronic Obstructive N/A N/A Pulmonary Disease (COPD), Coronary Artery Disease, Peripheral Arterial Disease, Peripheral Venous Disease, Hepatitis C, Osteoarthritis, Neuropathy, Received Chemotherapy Date Acquired: 07/12/2019 N/A N/A Weeks of Treatment: 19 N/A N/A Wound Status: Open N/A N/A Wound Recurrence: No N/A N/A Measurements L x W x D (cm) 2.2x0.8x0.2 N/A N/A Area (cm) : 1.382 N/A N/A Volume (cm) : 0.276 N/A N/A % Reduction in Area: 80.00% N/A N/A % Reduction in Volume: 80.00% N/A N/A Classification: Full Thickness Without Exposed N/A N/A Support Structures Exudate Amount: Medium N/A N/A Exudate Type: Serosanguineous  N/A N/A Exudate Color: red, brown N/A N/A Wound Margin: Distinct, outline attached N/A N/A Granulation Amount: Large (67-100%) N/A N/A Granulation Quality: Pink N/A N/A Necrotic Amount: Small (1-33%) N/A N/A Exposed Structures: Fat Layer (Subcutaneous Tissue): N/A N/A Yes Fascia: No Tendon: No Muscle: No Joint: No Bone: No Epithelialization: Medium (34-66%) N/A N/A Treatment Notes Electronic MARQUEZ, CEESAY ([id number]) Signed: 04/21/2022 5:09:23 PM By: [name] Entered By: [name] on 04/21/2022 10:54:14 [name] ([id number]) -------------------------------------------------------------------------------- Multi-Disciplinary Care Plan Details Patient Name: [name] Date of Service: 04/21/2022 10:30 AM Medical Record Number: [id number] Patient Account Number: [id number] Date of Birth/Sex: 11-Dec-1960 (61 y.o. M) Treating RN: Cornell Barman Primary Care Nesiah Jump: [name] Other Clinician: [name] Referring Demian Maisel: [name] Treating Braeson Rupe/Extender: Skipper Cliche in Treatment: 19 Active Inactive Necrotic Tissue Nursing Diagnoses: Impaired tissue integrity related to necrotic/devitalized tissue Knowledge deficit related to management of necrotic/devitalized  tissue Goals: Necrotic/devitalized tissue will be minimized in the wound bed Date Initiated: 02/07/2022 Target Resolution Date: 03/29/2022 Goal Status: Active Patient/caregiver will verbalize understanding of reason and process for debridement of necrotic tissue Date Initiated: 02/07/2022 Target Resolution Date: 03/29/2022 Goal Status: Active Interventions: Assess patient pain level pre-, during and post procedure and prior to discharge Provide education on necrotic tissue and debridement process Treatment Activities: Excisional debridement : 02/07/2022 Notes: Venous Leg Ulcer Nursing Diagnoses: Knowledge deficit related to disease process and management Goals: Patient will maintain optimal edema control Date Initiated: 12/06/2021 Date Inactivated: 12/27/2021 Target Resolution Date: 01/03/2022 Goal Status: Met Patient/caregiver will verbalize understanding of disease process and disease management Date Initiated: 12/06/2021 Target Resolution Date: 03/29/2022 Goal Status: Active Interventions: Assess peripheral edema status every visit. Compression as ordered Notes: Electronic Signature(s) Signed: 04/21/2022 5:09:23 PM By: [name] Signed: 04/21/2022 5:56:59 PM By: Gretta Cool, BSN, RN, CWS, Kim RN, BSN Entered By: [name] on 04/21/2022 10:52:29 [name] ([id number]) -------------------------------------------------------------------------------- Pain Assessment Details Patient Name: [name] Date of Service: 04/21/2022 10:30 AM Medical Record Number: [id number] Patient Account Number: [id number] Date of Birth/Sex: April 19, 1961 (61 y.o. M) Treating RN: Cornell Barman Primary Care Farris Blash: [name] Other Clinician: [name] Referring Braian Tijerina: [name] Treating Bartlett Enke/Extender: Skipper Cliche in Treatment: 19 Active Problems Location of Pain Severity and Description of Pain Patient Has Paino No Site Locations Pain Management and Medication Current  Pain Management: Electronic Signature(s) Signed: 04/21/2022 5:09:23 PM By: [name] Signed: 04/21/2022 5:56:59 PM By: Gretta Cool, BSN, RN, CWS, Kim RN, BSN Entered By: [name] on 04/21/2022 10:47:08 [name] ([id number]) -------------------------------------------------------------------------------- Patient/Caregiver Education Details Patient Name: [name] Date of Service: 04/21/2022 10:30 AM Medical Record Number: [id number] Patient Account Number: [id number] Date of Birth/Gender: 05/28/1961 (61 y.o. M) Treating RN: Cornell Barman Primary Care Physician: [name] Other Clinician: [name] Referring Physician: [name] Treating Physician/Extender: Skipper Cliche in Treatment: 19 Education Assessment Education Provided To: Patient Education Topics Provided Wound/Skin Impairment: Handouts: Other: continue wound care as directed Methods: Explain/Verbal Responses: State content correctly Electronic Signature(s) Signed: 04/21/2022 5:09:23 PM By: [name] Entered By: [name] on 04/21/2022 11:09:31 [name] ([id number]) -------------------------------------------------------------------------------- Wound Assessment Details Patient Name: [name] Date of Service: 04/21/2022 10:30 AM Medical Record Number: [id number] Patient Account Number: [id number] Date of Birth/Sex: 1961/07/04 (61 y.o. M) Treating RN: Cornell Barman Primary Care Nychelle Cassata: [name] Other Clinician: [name] Referring Leondra Cullin: [name] Treating Chrystian Cupples/Extender: Jeri Cos Weeks in Treatment: 19 Wound Status Wound Number: 12 Primary Venous Leg Ulcer Etiology: Wound Location: Left, Distal, Medial Ankle  Wound Open Wounding Event: Gradually Appeared Status: Date Acquired: 07/12/2019 Comorbid Anemia, Chronic Obstructive Pulmonary Disease (COPD), Weeks Of Treatment: 19 History: Coronary Artery Disease, Peripheral Arterial  Disease, Clustered Wound: No Peripheral Venous Disease, Hepatitis C, Osteoarthritis, Neuropathy, Received Chemotherapy Photos Wound Measurements Length: (cm) 2.2 Width: (cm) 0.8 Depth: (cm) 0.2 Area: (cm) 1.382 Volume: (cm) 0.276 % Reduction in Area: 80% % Reduction in Volume: 80% Epithelialization: Medium (34-66%) Wound Description Classification: Full Thickness Without Exposed Support Structu Wound Margin: Distinct, outline attached Exudate Amount: Medium Exudate Type: Serosanguineous Exudate Color: red, brown res Foul Odor After Cleansing: No Slough/Fibrino Yes Wound Bed Granulation Amount: Large (67-100%) Exposed Structure Granulation Quality: Pink Fascia Exposed: No Necrotic Amount: Small (1-33%) Fat Layer (Subcutaneous Tissue) Exposed: Yes Necrotic Quality: Adherent Slough Tendon Exposed: No Muscle Exposed: No Joint Exposed: No Bone Exposed: No Treatment Notes Wound #12 (Ankle) Wound Laterality: Left, Medial, Distal Cleanser Soap and Water Discharge Instruction: Gently cleanse wound with antibacterial soap, rinse and pat dry prior to dressing wounds SANG, BLOUNT ([id number]) Wound Cleanser Discharge Instruction: Wash your hands with soap and water. Remove old dressing, discard into plastic bag and place into trash. Cleanse the wound with Wound Cleanser prior to applying a clean dressing using gauze sponges, not tissues or cotton balls. Do not scrub or use excessive force. Pat dry using gauze sponges, not tissue or cotton balls. Peri-Wound Care Desitin Maximum Strength Ointment 4 (oz) Discharge Instruction: Apply around wound edges to avoid maceration Topical Gentamicin Discharge Instruction: Apply as directed by Kacey Dysert. Primary Dressing Hydrofera Blue Ready Transfer Foam, 2.5x2.5 (in/in) Discharge Instruction: Apply Hydrofera Blue Ready to wound bed as directed Secondary Dressing ABD Pad 5x9 (in/in) Discharge Instruction: Cover with ABD pad Secured  With Medipore Tape - 1M Medipore H Soft Cloth Surgical Tape, 2x2 (in/yd) Kerlix Roll Sterile or Non-Sterile 6-ply 4.5x4 (yd/yd) Discharge Instruction: Apply Kerlix as directed Tubigrip Size C, 2.75x10 (in/yd) Discharge Instruction: Apply 3 Tubigrip C 3-finger-widths below knee to base of toes to secure dressing and/or for swelling. This can be hand washed and air dried Compression Wrap Compression Stockings Add-Ons Electronic Signature(s) Signed: 04/21/2022 5:09:23 PM By: [name] Signed: 04/21/2022 5:56:59 PM By: Gretta Cool, BSN, RN, CWS, Kim RN, BSN Entered By: [name] on 04/21/2022 10:52:15 [name] ([id number]) -------------------------------------------------------------------------------- [place] Details Patient Name: [name] Date of Service: 04/21/2022 10:30 AM Medical Record Number: [id number] Patient Account Number: [id number] Date of Birth/Sex: 1960/08/02 (60 y.o. M) Treating RN: Cornell Barman Primary Care Jasemine Nawaz: [name] Other Clinician: [name] Referring Renton Berkley: [name] Treating Daena Alper/Extender: Skipper Cliche in Treatment: 19 Vital Signs Time Taken: 10:46 Temperature (F): 98.2 Height (in): 67 Pulse (bpm): 84 Weight (lbs): 150 Respiratory Rate (breaths/min): 18 Body Mass Index (BMI): 23.5 Blood Pressure (mmHg): 145/89 Reference Range: 80 - 120 mg / dl Electronic Signature(s) Signed: 04/21/2022 5:09:23 PM By: [name] Entered By: [name] on 04/21/2022 10:47:05

## 2022-04-21 NOTE — Progress Notes (Addendum)
Chad [name], Chad [name] ([id number]) Visit Report for 04/21/2022 Chief Complaint Document Details Patient Name: Chad [name], Chad [name] Date of Service: 04/21/2022 10:30 AM Medical Record Number: [id number] Patient Account Number: [id number] Date of Birth/Sex: [birth year] (61 y.o. M) Treating RN: Cornell Barman Primary Care Provider: [name] Other Clinician: [name] Referring Provider: [name] Treating Provider/Extender: Skipper Cliche in Treatment: 19 Information Obtained from: Patient Chief Complaint Left ankle ulcers Electronic Signature(s) Signed: 04/21/2022 10:19:44 AM By: Worthy Keeler PA-C Entered By: Worthy Keeler on 04/21/2022 10:19:44 Chad [name] ([id number]) -------------------------------------------------------------------------------- HPI Details Patient Name: Chad [name] Date of Service: 04/21/2022 10:30 AM Medical Record Number: [id number] Patient Account Number: [id number] Date of Birth/Sex: [birth year] (61 y.o. M) Treating RN: Cornell Barman Primary Care Provider: [name] Other Clinician: [name] Referring Provider: [name] Treating Provider/Extender: Skipper Cliche in Treatment: 19 History of Present Illness HPI Description: 10/08/18 on evaluation today patient actually presents to our office for initial evaluation concerning wounds that he has of the bilateral lower extremities. 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 He also has bilateral venous stasis. He apparently has been homeless currently following discharge from the hospital apparently he has been placed at [facility] which is is a skilled nursing facility locally. Nonetheless fortunately he does not show any signs of infection at this time which is good news. In fact several of the wound actually appears to be  showing some signs of improvement already in my pinion. There are a couple areas in the left leg in particular there likely gonna require some sharp debridement to help clear away some necrotic tissue and help with more sufficient healing. No fevers, chills, nausea, or vomiting noted at this time. 10/15/18 on evaluation today patient actually appears to be doing very well in regard to his bilateral lower extremities. He's been tolerating the dressing changes without complication. Fortunately there does not appear to be any evidence of active infection at this time which is great news. Overall I'm actually very pleased with how this has progressed in just one visits time. Readmission: 08/14/2020 upon evaluation today patient presents for re-evaluation here in our clinic. He is having issues with his left ankle region as well as his right toe and his right heel. He tells me that the toe and heel actually began as a area that was itching that he was scratching and then subsequently opened up into wounds. These may have been abscess areas I presume based on what I am seeing currently. With regard to his left ankle region he tells me this was a similar type occurrence although he does have venous stasis this very well may be more of a venous leg ulcer more than anything. Nonetheless I do believe that the patient would benefit from appropriate and aggressive wound care to try to help get things under better control here. He does have history of a stroke on the left side affecting him to some degree there that he is able to stand although he does have some residual weakness. Otherwise again the patient does have chronic venous insufficiency as previously noted. His arterial studies most recently obtained showed that he had an ABI on the right of 1.16 with a TBI of 0.52 and on the left and ABI of 1.14 with a TBI of 0.81. That was obtained on 06/19/2020. 08/28/2020 upon evaluation today patient  appears to be  doing decently well in regard to his wounds in general. He has been tolerating the dressing changes without complication. Fortunately there does not appear to be any signs of active infection which is great news. With that being said I think the [facility] is doing a good job I would recommend that we likely continue with that currently. 09/11/2020 upon evaluation today patient's wounds did not appear to be doing too poorly but again he is not really showing signs of significant improvement with regard to any of the wounds on the right. None of them have Hydrofera Blue on them I am not exactly sure why this is not being followed as the facility did not contact us to let us know of any issues with obtaining dressings or otherwise. With that being said he is supposed to be using Hydrofera Blue on both of the wounds on the right foot as well as the ankle wound on the left side. 09/18/2020 upon evaluation today patient appears to be doing poorly with regard to his wounds. Again right now the left ankle in particular showed signs of extreme maceration. Apparently he was told by someone with staff at [facility] they could not get the [facility]. With that being said this is something that is never been relayed to [place] one way or another. Also the patient subsequently has not supposed to have a border gauze dressing on. He should have an ABD pad and roll gauze to secure as this drains much too much just to have a border gauze dressing to cover. Nonetheless the fact that they are not using the appropriate dressing is directly causing deterioration of the left ankle wound it is significantly worse today compared to what it was previous. I did attempt to call [facility] while the patient was here I called three times and got no one to even pick up the phone. After this I had my for an office coordinator call and she was able to finally get through and leave a message with the D ON as of  dictation of this note which is roughly about an hour and a half later I still have not been able to speak with anyone at the facility. 09/25/2020 upon evaluation today patient actually showing signs of good improvement which is excellent news. He has been tolerating the dressing changes without complication. Fortunately there is no signs of active infection which is great news. No fevers, chills, nausea, vomiting, or diarrhea. I do feel like the facility has been doing a much better job at taking care of him as far as the dressings are concerned. However the director of nursing never did call me back. 10/09/2020 upon evaluation today patient appears to be doing well with regard to his wound. The toe ulcer did require some debridement but the other 2 areas actually appear to be doing quite well. 10/19/2020 upon evaluation today patient actually appears to be doing very well in regard to his wounds. In fact the heel does appear to be completely healed. The toe is doing better in the medial ankle on the left is also doing better. Overall I think he is headed in the right direction. 10/26/2020 upon evaluation today patient appears to be doing well with regard to his wound. He is showing signs of improvement which is great news and overall I am very pleased with where things stand today. No fevers, chills, nausea, vomiting, or diarrhea. 11/02/2020 upon evaluation today patient appears to be  doing well with regard to his wounds. He has been tolerating the dressing changes without complication overall I am extremely pleased with where things stand today. He in regard to the toe is almost completely healed and the medial ankle on the left is doing much better. 11/09/2020 upon evaluation today patient appears to be doing a little poorly in regard to his left medial ankle ulcer. Fortunately there does not appear to be any signs of systemic infection but unfortunately locally he does appear to be infected in fact he  has blue-green drainage consistent with Pseudomonas. Chad [name], Chad [name] ([id number]) 11/16/2020 upon evaluation today patient appears to be doing well with regard to his wound. It actually appears to be doing better. I did place him on gentamicin cream since the Cipro was actually resistant even though he was positive for Pseudomonas on culture. Overall I think that he does seem to be doing better though I am unsure whether or not they have actually been putting the cream on. The patient is not sure that we did talk to the nurse directly and she was going to initiate that treatment. Fortunately there does not appear to be any signs of active infection at this time. No fevers, chills, nausea, vomiting, or diarrhea. 4/28; the area on the right second toe is close to healed. Left medial ankle required debridement 12/07/2020 upon evaluation today patient appears to be doing well with regard to his wounds. In fact the right second toe appears to be completely healed which is great news. Fortunately there does not appear to be any signs of active infection at this time which is also great news. I think we can probably discontinue the gentamicin on top of everything else. 12/14/2020 upon evaluation today patient appears to be doing well with regard to his wound. He is making good progress and overall very pleased with where things stand today. There is no signs of active infection at this time which is great news. 12/28/2020 upon evaluation today patient appears to be doing well with regard to his wounds. He has been tolerating the dressing changes without complication. Fortunately there is no signs of active infection at this time. No fevers, chills, nausea, vomiting, or diarrhea. 12/28/2020 upon evaluation today patient's wound bed actually showed signs of excellent improvement. He has great epithelization and granulation I do not see any signs of infection overall I am extremely pleased with where things stand at  this point. No fevers, chills, nausea, vomiting, or diarrhea. 01/11/2021 upon evaluation today patient appears to be doing well with regard to his wound on his leg. He has been tolerating the dressing changes without complication. Fortunately there does not appear to be any signs of active infection which is great news. No fevers, chills, nausea, vomiting, or diarrhea. 01/25/2021 upon evaluation today patient appears to be doing well with regard to his wound. He has been tolerating the dressing changes without complication. Fortunately the collagen seems to be doing a great job which is excellent news. No fevers, chills, nausea, vomiting, or diarrhea. 02/08/2021 upon evaluation today patient's wound is actually looking a little bit worse especially in the periwound compared to previous. Fortunately there does not appear to be any signs of infection which is great news with that being said he does have some irritation around the periphery of the wound which has me more concerned. He actually had a dressing on that had not been changed in 3 days. He also is supposed to have daily dressing changes. With  regard to the dressing applied he had a silver alginate dressing and silver collagen is what is recommended and ordered. He also had no Desitin around the edges of the wound in the periwound region although that is on the order inspect to be done as well. In general I was very concerned I did contact [facility] actually spoke with Magda Paganini who is the scheduling individual and subsequently she stated that she would pass the information to the D ON apparently the D ON was not available to talk to me when I call today. 02/18/2021 upon evaluation today patient's wound is actually showing signs of improvement. Fortunately there does not appear to be any evidence of infection which is great news overall I am extremely pleased with where things stand today. No fevers, chills, nausea, vomiting, or  diarrhea. 8/3; patient presents for 1 week follow-up. He has no issues or complaints today. He denies signs of infection. 03/11/2021 upon evaluation today patient appears to be doing well with regard to his wound. He does have a little bit of slough noted on the surface of the wound but fortunately there does not appear to be any signs of active infection at this time. No fevers, chills, nausea, vomiting, or diarrhea. 03/18/2021 upon evaluation today patient appears to be doing well with regard to his wound. He has been tolerating the dressing changes without complication. There was a little irritation more proximal to where the wound was that was not noted last week but nonetheless this is very superficial just seems to be more irritation we just need to make sure to put a good amount of the zinc over the area in my opinion. Otherwise he does not seem to be doing significantly worse at all which is great news. 03/25/2021 upon evaluation today patient appears to be doing well with regard to his wound. He is going require some sharp debridement today to clear with some of the necrotic debris. I did perform this today without complication postdebridement wound bed appears to be doing much better this is great news. 04/08/2021 upon evaluation today patient appears to be doing decently well in regard to his wound although the overall measurement is not significantly smaller compared to previous. It is gone down a little bit but still the facility continues to not really put the appropriate dressings in place in fact he was supposed to have collagen we think he probably had more of an allergy to At this point. Fortunately there does not appear to be any signs of active infection systemically though locally I do not see anything on initial visualization either as far as erythema or warmth. 04/15/2021 upon evaluation today patient appears to be doing well with regard to his wound. He is actually showing signs  of improvement. I did place him on antibiotics last week, Cipro. He has been taking that 2 times a day and seems to be tolerating it very well. I do not see any evidence of worsening and in fact the overall appearance of the wound is smaller today which is also great news. 9/26; left medial ankle chronic venous insufficiency wound is improved. Using Hydrofera Blue 10/10; left medial ankle chronic venous insufficiency. Wound has not changed much in appearance completely nonviable surface. Apparently there have been problems getting the right product on the wound at the facility although he came in with [facility] on today 05/14/2021 upon evaluation today patient appears to be doing well with regard to his wound. I think he is  making progress here which is good news. Fortunately there does not appear to be any signs of active infection at this time. No fevers, chills, nausea, vomiting, or diarrhea. 05/20/2021 upon evaluation today patient appears to be doing well with regard to his wound. He is showing signs of good improvement which is great news. There does not appear to be any evidence of active infection which is also excellent news. No fevers, chills, nausea, vomiting, or diarrhea. 05/28/2021 upon evaluation today patient appears to be doing quite well. There does not appear to be any signs of active infection at this time which is great news. Overall I am extremely pleased with where things stand today. I think he is headed in the right direction. 06/11/2021 upon evaluation today patient appears to be doing well with regard to his left ankle ulcer and poorly in regard to the toe ulcer on the second toe right foot. This appears to show signs of joint exposure. Apparently this has been present for 1 to 2 months although he kept [facility], Chad [name] ([id number]) forgetting to tell me about it. That is unfortunate as right now it definitely appears to be doing significantly worse than what I would like  to see. There does not appear to be any signs of active infection systemically though locally I am concerned about the possibility of infection the toe is quite red. Again no one from the facility ever contacted [place] to advise that this was going on in the interim either. 06/17/2021 upon evaluation today patient presents for follow-up I did review his x-ray which showed a navicular bone fracture I am unsure of the chronicity of this. Subsequently he also had osteomyelitis of the toe which was what I was more concerned about this did not show up on x-ray but did show up on the pathology scrapings. This was listed as acute osteomyelitis. Nonetheless at this point I think that the antibiotic treatment is the best regimen to go with currently. The patient is in agreement with that plan. Nonetheless he has initially 30 days of doxycycline off likely extend that towards the end of the treatment cycle that will be around the middle of December for an additional 2 weeks. That all depends on how well he continues to heal. Nonetheless based on what I am seeing in the foot I did want a proceed with an MRI as well which I think will be helpful to identify if there is anything else that needs to be addressed from the standpoint of infection. 06/24/2021 upon evaluation today patient appears to be doing pretty well in regards to his wounds. I think both are actually showing signs of improvement which is good I did review his MRI today which did show signs of osteomyelitis of the middle and proximal phalanx on his right foot of the affected toe. With that being said this is actually showing signs of significant improvement today already with the antibiotic therapy I think the redness is also improved. Overall I think that we just need to give this some time with appropriate wound care we will see how things go potentially hyperbarics could be considered. 07/02/2021 upon inspection today patient actually appears to be  doing well in regard to his left ankle which is getting very close to complete resolution of pleased in that regard. Unfortunately he is continuing to have issues with his second toe right foot and this seems to still be very painful for him. Recommend he try something different from the standpoint of  antibiotics. 07/15/2021 upon evaluation today patient appears to be doing actually pretty well in regard to his foot. This is actually showing signs of significant improvement which is great news. Overall I feel like the patient is improving both in regard to the second toe as well as the ankle on the left. With that being said the biggest issue that I do see currently is that he is needing to have a refill of the doxycycline that we previously treated him with. He also did see podiatry they are not going to recommend any amputation at this point since he seems to be doing quite well. For that reason we just need to keep things under control from an infection standpoint. 08/01/2021 upon evaluation today patient appears to be doing well with regard to his wound. He has been tolerating the dressing changes without complication. Fortunately there does not appear to be any evidence of active infection locally nor systemically at this point. In fact I think everything is doing excellent in fact his second toe on the right foot is almost healed and the ankle on the left ankle region is actually very close to being healed as well. 08/08/2021 upon evaluation today patient appears to be doing well with regard to his wound. He has been tolerating the dressing changes without complication. Fortunately I do not see any signs of active infection at this time. Readmission: 12-06-2021 upon evaluation today patient presents for reevaluation here in the clinic he does tell me that he was being seen in facility at [place] by a provider that was coming in. He is not sure who this was. He tells me however that the  wound seems to have gotten worse even compared to where it was when we last saw him at this point. With that being said I do believe that he is likely going need ongoing wound care here in the clinic and I do believe that we need to be the ones to frontline this since his wound does seem to be getting worse not better at this point. He voiced understanding. He is also in agreement with this plan and feels more comfortable coming here she tells me. Patient's medical history really has not changed since his prior admission he was only gone since January. 12-27-2021 upon evaluation today patient appears to be doing well with regard to his wound they did run out of the [facility] so they did not put anything on just an ABD pad with gentamicin. Still we are seeing some signs of good improvement here with some new epithelization which is great news. 01-10-2022 upon evaluation today patient appears to be doing well with regard to his wounds and he is going require some sharp debridement but overall seems to be making good progress. Fortunately I do not see any evidence of active infection locally or systemically at this time which is great news. 01-24-2022 upon evaluation today patient appears to be doing well with regard to his wound. The facility actually came and dropped him off early and he had another appointment at the hospital and then they just brought him over here and this was still hours before his appointment this afternoon. For that reason we did do our best to work him in this morning and fortunately had some space to make this happen. With that being said patient's wound does seem to be making progress here and I am very pleased in that regard I do not see any signs of active infection locally  or systemically at this time. 02-07-2022 patient appears to be doing well currently in regard to his wounds. In fact one of them the more proximal is healed the distal is still open but seems to be  doing excellent. Fortunately I do not see any evidence of active infection locally or systemically at this time which is great news. No fevers, chills, nausea, vomiting, or diarrhea. 7/27; left medial ankle venous. Improving per our intake nurse. We are using Hydrofera Blue under Tubigrip compression. They are changing that at his facility 03-06-2022 upon evaluation today patient appears to be doing well with regard to the his wound he is can require some sharp debridement but seems to be making excellent progress. Fortunately I do not see any evidence of active infection locally or systemically which is great news. 03-27-2022 upon evaluation today patient's wound is actually showing signs of excellent improvement. Fortunately I see no signs of active infection locally or systemically at this time which is great news. No fevers, chills, nausea, vomiting, or diarrhea. 04-21-2022 upon evaluation today patient appears to be doing excellent in regard to his wound in fact this is very close to resolution based on what I am seeing. I do not see any evidence of active infection locally or systemically at this time which is great news and overall I am extremely pleased with where we are today. Electronic Signature(s) Chad [name], Chad [name] ([id number]) Signed: 04/21/2022 11:16:29 AM By: Worthy Keeler PA-C Entered By: Worthy Keeler on 04/21/2022 11:16:29 Chad [name], Chad [name] ([id number]) -------------------------------------------------------------------------------- Physical Exam Details Patient Name: Chad [name] Date of Service: 04/21/2022 10:30 AM Medical Record Number: [id number] Patient Account Number: [id number] Date of Birth/Sex: 11-10-1960 (61 y.o. M) Treating RN: Cornell Barman Primary Care Provider: [name] Other Clinician: [name] Referring Provider: [name] Treating Provider/Extender: Skipper Cliche in Treatment: 38 Constitutional Well-nourished and well-hydrated in no acute  distress. Respiratory normal breathing without difficulty. Psychiatric this patient is able to make decisions and demonstrates good insight into disease process. Alert and Oriented x 3. pleasant and cooperative. Notes Patient's wound again did not require any sharp debridement which is excellent. I am actually very pleased in this regard I am hopeful he will continue to show signs of improvement going forward. Electronic Signature(s) Signed: 04/21/2022 11:16:58 AM By: Worthy Keeler PA-C Entered By: Worthy Keeler on 04/21/2022 11:16:58 Chad [name] ([id number]) -------------------------------------------------------------------------------- Physician Orders Details Patient Name: Chad [name] Date of Service: 04/21/2022 10:30 AM Medical Record Number: [id number] Patient Account Number: [id number] Date of Birth/Sex: 10/21/1960 (61 y.o. M) Treating RN: Cornell Barman Primary Care Provider: [name] Other Clinician: [name] Referring Provider: [name] Treating Provider/Extender: Skipper Cliche in Treatment: 19 Verbal / Phone Orders: No Diagnosis Coding ICD-10 Coding Code Description [phone] Chronic venous hypertension (idiopathic) with ulcer and inflammation of left lower extremity L97.322 Non-pressure chronic ulcer of left ankle with fat layer exposed 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 side Follow-up Appointments o Return Appointment in 2 weeks. Bathing/ Shower/ Hygiene o Wash wounds with antibacterial soap and water. o No tub bath. Anesthetic (Use 'Patient Medications' Section for Anesthetic Order Entry) o Lidocaine applied to wound bed Wound Treatment Wound #12 - Ankle Wound Laterality: Left, Medial, Distal Cleanser: Soap and Water 3 x Per Week/30 Days Discharge Instructions: Gently cleanse wound with antibacterial soap, rinse and pat dry prior to dressing wounds Cleanser: Wound Cleanser 3  x Per Week/30 Days Discharge Instructions: Wash your hands with soap and water.  Remove old dressing, discard into plastic bag and place into trash. Cleanse the wound with Wound Cleanser prior to applying a clean dressing using gauze sponges, not tissues or cotton balls. Do not scrub or use excessive force. Pat dry using gauze sponges, not tissue or cotton balls. Peri-Wound Care: Desitin Maximum Strength Ointment 4 (oz) 3 x Per Week/30 Days Discharge Instructions: Apply around wound edges to avoid maceration Topical: Gentamicin 3 x Per Week/30 Days Discharge Instructions: Apply as directed by provider. Primary Dressing: Hydrofera Blue Ready Transfer Foam, 2.5x2.5 (in/in) 3 x Per Week/30 Days Discharge Instructions: Apply Hydrofera Blue Ready to wound bed as directed Secondary Dressing: ABD Pad 5x9 (in/in) 3 x Per Week/30 Days Discharge Instructions: Cover with ABD pad Secured With: Medipore Tape - 90M Medipore H Soft Cloth Surgical Tape, 2x2 (in/yd) 3 x Per Week/30 Days Secured With: [employer] Sterile or Non-Sterile 6-ply 4.5x4 (yd/yd) 3 x Per Week/30 Days Discharge Instructions: Apply Kerlix as directed Secured With: Tubigrip Size C, 2.75x10 (in/yd) 3 x Per Week/30 Days Discharge Instructions: Apply 3 Tubigrip C 3-finger-widths below knee to base of toes to secure dressing and/or for swelling. This can be hand washed and air dried Electronic Signature(s) Signed: 04/21/2022 12:16:04 PM By: Worthy Keeler PA-C Signed: 04/21/2022 5:09:23 PM By: [name] Entered By: [name] on 04/21/2022 11:08:35 Chad [name] ([id number]) Chad [name], Chad [name] ([id number]) -------------------------------------------------------------------------------- Problem List Details Patient Name: Chad [name] Date of Service: 04/21/2022 10:30 AM Medical Record Number: [id number] Patient Account Number: [id number] Date of Birth/Sex: 1961-02-08 (61 y.o. M) Treating RN: Cornell Barman Primary Care Provider:  [name] Other Clinician: [name] Referring Provider: [name] Treating Provider/Extender: Skipper Cliche in Treatment: 19 Active Problems ICD-10 Encounter Code Description Active Date MDM Diagnosis 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 (idiopathic) with ulcer and inflammation of 12/06/2021 No Yes left lower extremity L97.322 Non-pressure chronic ulcer of left ankle with fat layer exposed 12/06/2021 No Yes I69.354 Hemiplegia and hemiparesis following cerebral infarction affecting left 12/06/2021 No Yes non-dominant side Inactive Problems Resolved Problems Electronic Signature(s) Signed: 04/21/2022 10:19:38 AM By: Worthy Keeler PA-C Entered By: Worthy Keeler on 04/21/2022 10:19:38 Chad [name] ([id number]) -------------------------------------------------------------------------------- Progress Note Details Patient Name: Chad [name] Date of Service: 04/21/2022 10:30 AM Medical Record Number: [id number] Patient Account Number: [id number] Date of Birth/Sex: 26-Oct-1960 (61 y.o. M) Treating RN: Cornell Barman Primary Care Provider: [name] Other Clinician: [name] Referring Provider: [name] Treating Provider/Extender: Skipper Cliche in Treatment: 19 Subjective Chief Complaint Information obtained from Patient Left ankle ulcers History of Present Illness (HPI) 10/08/18 on evaluation today patient actually presents to our office for initial evaluation concerning wounds that he has of the bilateral lower extremities. 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 He also has bilateral venous stasis. He apparently has been homeless currently following discharge from the hospital apparently he has been placed at [facility] which is is a skilled nursing facility locally. Nonetheless  fortunately he does not show any signs of infection at this time which is good news. In fact several of the wound actually appears to be showing some signs of improvement already in my pinion. There are a couple areas in the left leg in particular there likely gonna require some sharp debridement to help clear away some necrotic tissue and help with more sufficient healing. No fevers, chills, nausea,  or vomiting noted at this time. 10/15/18 on evaluation today patient actually appears to be doing very well in regard to his bilateral lower extremities. He's been tolerating the dressing changes without complication. Fortunately there does not appear to be any evidence of active infection at this time which is great news. Overall I'm actually very pleased with how this has progressed in just one visits time. Readmission: 08/14/2020 upon evaluation today patient presents for re-evaluation here in our clinic. He is having issues with his left ankle region as well as his right toe and his right heel. He tells me that the toe and heel actually began as a area that was itching that he was scratching and then subsequently opened up into wounds. These may have been abscess areas I presume based on what I am seeing currently. With regard to his left ankle region he tells me this was a similar type occurrence although he does have venous stasis this very well may be more of a venous leg ulcer more than anything. Nonetheless I do believe that the patient would benefit from appropriate and aggressive wound care to try to help get things under better control here. He does have history of a stroke on the left side affecting him to some degree there that he is able to stand although he does have some residual weakness. Otherwise again the patient does have chronic venous insufficiency as previously noted. His arterial studies most recently obtained showed that he had an ABI on the right of 1.16 with a TBI of 0.52 and on  the left and ABI of 1.14 with a TBI of 0.81. That was obtained on 06/19/2020. 08/28/2020 upon evaluation today patient appears to be doing decently well in regard to his wounds in general. He has been tolerating the dressing changes without complication. Fortunately there does not appear to be any signs of active infection which is great news. With that being said I think the [facility] is doing a good job I would recommend that we likely continue with that currently. 09/11/2020 upon evaluation today patient's wounds did not appear to be doing too poorly but again he is not really showing signs of significant improvement with regard to any of the wounds on the right. None of them have Hydrofera Blue on them I am not exactly sure why this is not being followed as the facility did not contact us to let us know of any issues with obtaining dressings or otherwise. With that being said he is supposed to be using Hydrofera Blue on both of the wounds on the right foot as well as the ankle wound on the left side. 09/18/2020 upon evaluation today patient appears to be doing poorly with regard to his wounds. Again right now the left ankle in particular showed signs of extreme maceration. Apparently he was told by someone with staff at [facility] they could not get the [facility]. With that being said this is something that is never been relayed to [place] one way or another. Also the patient subsequently has not supposed to have a border gauze dressing on. He should have an ABD pad and roll gauze to secure as this drains much too much just to have a border gauze dressing to cover. Nonetheless the fact that they are not using the appropriate dressing is directly causing deterioration of the left ankle wound it is significantly worse today compared to what it was previous. I did attempt to call [facility] while the  patient was here I called three times and got no one to even pick up the phone.  After this I had my for an office coordinator call and she was able to finally get through and leave a message with the D ON as of dictation of this note which is roughly about an hour and a half later I still have not been able to speak with anyone at the facility. 09/25/2020 upon evaluation today patient actually showing signs of good improvement which is excellent news. He has been tolerating the dressing changes without complication. Fortunately there is no signs of active infection which is great news. No fevers, chills, nausea, vomiting, or diarrhea. I do feel like the facility has been doing a much better job at taking care of him as far as the dressings are concerned. However the director of nursing never did call me back. 10/09/2020 upon evaluation today patient appears to be doing well with regard to his wound. The toe ulcer did require some debridement but the other 2 areas actually appear to be doing quite well. 10/19/2020 upon evaluation today patient actually appears to be doing very well in regard to his wounds. In fact the heel does appear to be completely healed. The toe is doing better in the medial ankle on the left is also doing better. Overall I think he is headed in the right direction. 10/26/2020 upon evaluation today patient appears to be doing well with regard to his wound. He is showing signs of improvement which is great news and overall I am very pleased with where things stand today. No fevers, chills, nausea, vomiting, or diarrhea. 11/02/2020 upon evaluation today patient appears to be doing well with regard to his wounds. He has been tolerating the dressing changes without complication overall I am extremely pleased with where things stand today. He in regard to the toe is almost completely healed and the medial [facility] ([id number]) ankle on the left is doing much better. 11/09/2020 upon evaluation today patient appears to be doing a little poorly in regard to his left  medial ankle ulcer. Fortunately there does not appear to be any signs of systemic infection but unfortunately locally he does appear to be infected in fact he has blue-green drainage consistent with Pseudomonas. 11/16/2020 upon evaluation today patient appears to be doing well with regard to his wound. It actually appears to be doing better. I did place him on gentamicin cream since the Cipro was actually resistant even though he was positive for Pseudomonas on culture. Overall I think that he does seem to be doing better though I am unsure whether or not they have actually been putting the cream on. The patient is not sure that we did talk to the nurse directly and she was going to initiate that treatment. Fortunately there does not appear to be any signs of active infection at this time. No fevers, chills, nausea, vomiting, or diarrhea. 4/28; the area on the right second toe is close to healed. Left medial ankle required debridement 12/07/2020 upon evaluation today patient appears to be doing well with regard to his wounds. In fact the right second toe appears to be completely healed which is great news. Fortunately there does not appear to be any signs of active infection at this time which is also great news. I think we can probably discontinue the gentamicin on top of everything else. 12/14/2020 upon evaluation today patient appears to be doing well with regard to his wound. He is  making good progress and overall very pleased with where things stand today. There is no signs of active infection at this time which is great news. 12/28/2020 upon evaluation today patient appears to be doing well with regard to his wounds. He has been tolerating the dressing changes without complication. Fortunately there is no signs of active infection at this time. No fevers, chills, nausea, vomiting, or diarrhea. 12/28/2020 upon evaluation today patient's wound bed actually showed signs of excellent improvement. He has  great epithelization and granulation I do not see any signs of infection overall I am extremely pleased with where things stand at this point. No fevers, chills, nausea, vomiting, or diarrhea. 01/11/2021 upon evaluation today patient appears to be doing well with regard to his wound on his leg. He has been tolerating the dressing changes without complication. Fortunately there does not appear to be any signs of active infection which is great news. No fevers, chills, nausea, vomiting, or diarrhea. 01/25/2021 upon evaluation today patient appears to be doing well with regard to his wound. He has been tolerating the dressing changes without complication. Fortunately the collagen seems to be doing a great job which is excellent news. No fevers, chills, nausea, vomiting, or diarrhea. 02/08/2021 upon evaluation today patient's wound is actually looking a little bit worse especially in the periwound compared to previous. Fortunately there does not appear to be any signs of infection which is great news with that being said he does have some irritation around the periphery of the wound which has me more concerned. He actually had a dressing on that had not been changed in 3 days. He also is supposed to have daily dressing changes. With regard to the dressing applied he had a silver alginate dressing and silver collagen is what is recommended and ordered. He also had no Desitin around the edges of the wound in the periwound region although that is on the order inspect to be done as well. In general I was very concerned I did contact [facility] actually spoke with Magda Paganini who is the scheduling individual and subsequently she stated that she would pass the information to the D ON apparently the D ON was not available to talk to me when I call today. 02/18/2021 upon evaluation today patient's wound is actually showing signs of improvement. Fortunately there does not appear to be any evidence of infection  which is great news overall I am extremely pleased with where things stand today. No fevers, chills, nausea, vomiting, or diarrhea. 8/3; patient presents for 1 week follow-up. He has no issues or complaints today. He denies signs of infection. 03/11/2021 upon evaluation today patient appears to be doing well with regard to his wound. He does have a little bit of slough noted on the surface of the wound but fortunately there does not appear to be any signs of active infection at this time. No fevers, chills, nausea, vomiting, or diarrhea. 03/18/2021 upon evaluation today patient appears to be doing well with regard to his wound. He has been tolerating the dressing changes without complication. There was a little irritation more proximal to where the wound was that was not noted last week but nonetheless this is very superficial just seems to be more irritation we just need to make sure to put a good amount of the zinc over the area in my opinion. Otherwise he does not seem to be doing significantly worse at all which is great news. 03/25/2021 upon evaluation today patient appears to be  doing well with regard to his wound. He is going require some sharp debridement today to clear with some of the necrotic debris. I did perform this today without complication postdebridement wound bed appears to be doing much better this is great news. 04/08/2021 upon evaluation today patient appears to be doing decently well in regard to his wound although the overall measurement is not significantly smaller compared to previous. It is gone down a little bit but still the facility continues to not really put the appropriate dressings in place in fact he was supposed to have collagen we think he probably had more of an allergy to At this point. Fortunately there does not appear to be any signs of active infection systemically though locally I do not see anything on initial visualization either as far as erythema or  warmth. 04/15/2021 upon evaluation today patient appears to be doing well with regard to his wound. He is actually showing signs of improvement. I did place him on antibiotics last week, Cipro. He has been taking that 2 times a day and seems to be tolerating it very well. I do not see any evidence of worsening and in fact the overall appearance of the wound is smaller today which is also great news. 9/26; left medial ankle chronic venous insufficiency wound is improved. Using Hydrofera Blue 10/10; left medial ankle chronic venous insufficiency. Wound has not changed much in appearance completely nonviable surface. Apparently there have been problems getting the right product on the wound at the facility although he came in with [facility] on today 05/14/2021 upon evaluation today patient appears to be doing well with regard to his wound. I think he is making progress here which is good news. Fortunately there does not appear to be any signs of active infection at this time. No fevers, chills, nausea, vomiting, or diarrhea. 05/20/2021 upon evaluation today patient appears to be doing well with regard to his wound. He is showing signs of good improvement which is great news. There does not appear to be any evidence of active infection which is also excellent news. No fevers, chills, nausea, vomiting, or diarrhea. Chad [name], Chad [name] ([id number]) 05/28/2021 upon evaluation today patient appears to be doing quite well. There does not appear to be any signs of active infection at this time which is great news. Overall I am extremely pleased with where things stand today. I think he is headed in the right direction. 06/11/2021 upon evaluation today patient appears to be doing well with regard to his left ankle ulcer and poorly in regard to the toe ulcer on the second toe right foot. This appears to show signs of joint exposure. Apparently this has been present for 1 to 2 months although he kept forgetting  to tell me about it. That is unfortunate as right now it definitely appears to be doing significantly worse than what I would like to see. There does not appear to be any signs of active infection systemically though locally I am concerned about the possibility of infection the toe is quite red. Again no one from the facility ever contacted [place] to advise that this was going on in the interim either. 06/17/2021 upon evaluation today patient presents for follow-up I did review his x-ray which showed a navicular bone fracture I am unsure of the chronicity of this. Subsequently he also had osteomyelitis of the toe which was what I was more concerned about this did not show up on x-ray but did show up on  the pathology scrapings. This was listed as acute osteomyelitis. Nonetheless at this point I think that the antibiotic treatment is the best regimen to go with currently. The patient is in agreement with that plan. Nonetheless he has initially 30 days of doxycycline off likely extend that towards the end of the treatment cycle that will be around the middle of December for an additional 2 weeks. That all depends on how well he continues to heal. Nonetheless based on what I am seeing in the foot I did want a proceed with an MRI as well which I think will be helpful to identify if there is anything else that needs to be addressed from the standpoint of infection. 06/24/2021 upon evaluation today patient appears to be doing pretty well in regards to his wounds. I think both are actually showing signs of improvement which is good I did review his MRI today which did show signs of osteomyelitis of the middle and proximal phalanx on his right foot of the affected toe. With that being said this is actually showing signs of significant improvement today already with the antibiotic therapy I think the redness is also improved. Overall I think that we just need to give this some time with appropriate wound care we will  see how things go potentially hyperbarics could be considered. 07/02/2021 upon inspection today patient actually appears to be doing well in regard to his left ankle which is getting very close to complete resolution of pleased in that regard. Unfortunately he is continuing to have issues with his second toe right foot and this seems to still be very painful for him. Recommend he try something different from the standpoint of antibiotics. 07/15/2021 upon evaluation today patient appears to be doing actually pretty well in regard to his foot. This is actually showing signs of significant improvement which is great news. Overall I feel like the patient is improving both in regard to the second toe as well as the ankle on the left. With that being said the biggest issue that I do see currently is that he is needing to have a refill of the doxycycline that we previously treated him with. He also did see podiatry they are not going to recommend any amputation at this point since he seems to be doing quite well. For that reason we just need to keep things under control from an infection standpoint. 08/01/2021 upon evaluation today patient appears to be doing well with regard to his wound. He has been tolerating the dressing changes without complication. Fortunately there does not appear to be any evidence of active infection locally nor systemically at this point. In fact I think everything is doing excellent in fact his second toe on the right foot is almost healed and the ankle on the left ankle region is actually very close to being healed as well. 08/08/2021 upon evaluation today patient appears to be doing well with regard to his wound. He has been tolerating the dressing changes without complication. Fortunately I do not see any signs of active infection at this time. Readmission: 12-06-2021 upon evaluation today patient presents for reevaluation here in the clinic he does tell me that he was being seen  in facility at [place] by a provider that was coming in. He is not sure who this was. He tells me however that the wound seems to have gotten worse even compared to where it was when we last saw him at this point. With that being said I  do believe that he is likely going need ongoing wound care here in the clinic and I do believe that we need to be the ones to frontline this since his wound does seem to be getting worse not better at this point. He voiced understanding. He is also in agreement with this plan and feels more comfortable coming here she tells me. Patient's medical history really has not changed since his prior admission he was only gone since January. 12-27-2021 upon evaluation today patient appears to be doing well with regard to his wound they did run out of the [facility] so they did not put anything on just an ABD pad with gentamicin. Still we are seeing some signs of good improvement here with some new epithelization which is great news. 01-10-2022 upon evaluation today patient appears to be doing well with regard to his wounds and he is going require some sharp debridement but overall seems to be making good progress. Fortunately I do not see any evidence of active infection locally or systemically at this time which is great news. 01-24-2022 upon evaluation today patient appears to be doing well with regard to his wound. The facility actually came and dropped him off early and he had another appointment at the hospital and then they just brought him over here and this was still hours before his appointment this afternoon. For that reason we did do our best to work him in this morning and fortunately had some space to make this happen. With that being said patient's wound does seem to be making progress here and I am very pleased in that regard I do not see any signs of active infection locally or systemically at this time. 02-07-2022 patient appears to be doing well  currently in regard to his wounds. In fact one of them the more proximal is healed the distal is still open but seems to be doing excellent. Fortunately I do not see any evidence of active infection locally or systemically at this time which is great news. No fevers, chills, nausea, vomiting, or diarrhea. 7/27; left medial ankle venous. Improving per our intake nurse. We are using Hydrofera Blue under Tubigrip compression. They are changing that at his facility 03-06-2022 upon evaluation today patient appears to be doing well with regard to the his wound he is can require some sharp debridement but seems to be making excellent progress. Fortunately I do not see any evidence of active infection locally or systemically which is great news. 03-27-2022 upon evaluation today patient's wound is actually showing signs of excellent improvement. Fortunately I see no signs of active infection locally or systemically at this time which is great news. No fevers, chills, nausea, vomiting, or diarrhea. 04-21-2022 upon evaluation today patient appears to be doing excellent in regard to his wound in fact this is very close to resolution based on what I am seeing. I do not see any evidence of active infection locally or systemically at this time which is great news and overall I am extremely Chad [name], Chad [name] ([id number]) pleased with where we are today. Objective Constitutional Well-nourished and well-hydrated in no acute distress. Vitals Time Taken: 10:46 AM, Height: 67 in, Weight: 150 lbs, BMI: 23.5, Temperature: 98.2 F, Pulse: 84 bpm, Respiratory Rate: 18 breaths/min, Blood Pressure: 145/89 mmHg. Respiratory normal breathing without difficulty. Psychiatric this patient is able to make decisions and demonstrates good insight into disease process. Alert and Oriented x 3. pleasant and cooperative. General Notes: Patient's wound again did not require  any sharp debridement which is excellent. I am actually very  pleased in this regard I am hopeful he will continue to show signs of improvement going forward. Integumentary (Hair, Skin) Wound #12 status is Open. Original cause of wound was Gradually Appeared. The date acquired was: 07/12/2019. The wound has been in treatment 19 weeks. The wound is located on the Left,Distal,Medial Ankle. The wound measures 2.2cm length x 0.8cm width x 0.2cm depth; 1.382cm^2 area and 0.276cm^3 volume. There is Fat Layer (Subcutaneous Tissue) exposed. There is a medium amount of serosanguineous drainage noted. The wound margin is distinct with the outline attached to the wound base. There is large (67-100%) pink granulation within the wound bed. There is a small (1-33%) amount of necrotic tissue within the wound bed including Adherent Slough. 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 Bathing/ Shower/ Hygiene: Wash wounds with antibacterial soap and water. No tub bath. Anesthetic (Use 'Patient Medications' Section for Anesthetic Order Entry): Lidocaine applied to wound bed WOUND #12: - Ankle Wound Laterality: Left, Medial, Distal Cleanser: Soap and Water 3 x Per Week/30 Days Discharge Instructions: Gently cleanse wound with antibacterial soap, rinse and pat dry prior to dressing wounds Cleanser: Wound Cleanser 3 x Per Week/30 Days Discharge Instructions: Wash your hands with soap and water. Remove old dressing, discard into plastic bag and place into trash. Cleanse the wound with Wound Cleanser prior to applying a clean dressing using gauze sponges, not tissues or cotton balls. Do not scrub or use excessive force. Pat dry using gauze sponges, not tissue or cotton  balls. Peri-Wound Care: Desitin Maximum Strength Ointment 4 (oz) 3 x Per Week/30 Days Discharge Instructions: Apply around wound edges to avoid maceration Topical: Gentamicin 3 x Per Week/30 Days Discharge Instructions: Apply as directed by provider. Primary Dressing: Hydrofera Blue Ready Transfer Foam, 2.5x2.5 (in/in) 3 x Per Week/30 Days Discharge Instructions: Apply Hydrofera Blue Ready to wound bed as directed Chad [name], Chad [name] ([id number]) Secondary Dressing: ABD Pad 5x9 (in/in) 3 x Per Week/30 Days Discharge Instructions: Cover with ABD pad Secured With: Medipore Tape - 39M Medipore H Soft Cloth Surgical Tape, 2x2 (in/yd) 3 x Per Week/30 Days Secured With: [employer] Sterile or Non-Sterile 6-ply 4.5x4 (yd/yd) 3 x Per Week/30 Days Discharge Instructions: Apply Kerlix as directed Secured With: Tubigrip Size C, 2.75x10 (in/yd) 3 x Per Week/30 Days Discharge Instructions: Apply 3 Tubigrip C 3-finger-widths below knee to base of toes to secure dressing and/or for swelling. This can be hand washed and air dried 1. I am going to suggest that we continue with the Desitin around the edges of the wound followed by gentamicin to the wound bed itself and then the [facility]. 2. I am also can recommend that we have the patient continue to monitor for any signs of worsening or infection if anything changes he should contact the office and let me know. We will see patient back for reevaluation in 2 weeks here in the clinic. If anything worsens or changes patient will contact our office for additional recommendations. Electronic Signature(s) Signed: 04/21/2022 11:17:24 AM By: Worthy Keeler PA-C Entered By: Worthy Keeler on 04/21/2022 11:17:24 Chad [name] ([id number]) -------------------------------------------------------------------------------- SuperBill Details Patient Name: Chad [name] Date of Service: 04/21/2022 Medical Record Number: [id number] Patient Account Number:  [id number] Date  of Birth/Sex: 1961-03-11 (61 y.o. M) Treating RN: Cornell Barman Primary Care Provider: [name] Other Clinician: [name] Referring Provider: [name] Treating Provider/Extender: Skipper Cliche in Treatment: 19 Diagnosis Coding ICD-10 Codes Code Description [phone] Chronic venous hypertension (idiopathic) with ulcer and inflammation of left lower extremity L97.322 Non-pressure chronic ulcer of left ankle with fat layer exposed 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 side Facility Procedures CPT4 Code: [id number] Description: 99213 - WOUND CARE VISIT-LEV 3 EST PT Modifier: Quantity: 1 Physician Procedures CPT4 Code Description: [id number] 99213 - WC PHYS LEVEL 3 - EST PT Modifier: Quantity: 1 CPT4 Code Description: ICD-10 Diagnosis Description 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 (idiopathic) with ulcer and inflammation of L97.322 Non-pressure chronic ulcer of left ankle with fat layer exposed I69.354 Hemiplegia and hemiparesis following  cerebral infarction affecting left Modifier: left lower extremity non-dominant side Quantity: Electronic Signature(s) Signed: 04/21/2022 11:18:44 AM By: Worthy Keeler PA-C Entered By: Worthy Keeler on 04/21/2022 11:18:44

## 2022-04-22 ENCOUNTER — Telehealth: Payer: Self-pay

## 2022-04-22 NOTE — Telephone Encounter (Signed)
[name] called trying to set up an appointment with [facility] for this patient. Callback # [phone]

## 2022-04-29 ENCOUNTER — Inpatient Hospital Stay (HOSPITAL_BASED_OUTPATIENT_CLINIC_OR_DEPARTMENT_OTHER): Payer: Medicaid Other | Admitting: Internal Medicine

## 2022-04-29 ENCOUNTER — Inpatient Hospital Stay: Payer: Medicaid Other

## 2022-04-29 ENCOUNTER — Encounter: Payer: Self-pay | Admitting: Internal Medicine

## 2022-04-29 ENCOUNTER — Inpatient Hospital Stay: Payer: Medicaid Other | Attending: Internal Medicine

## 2022-04-29 VITALS — BP 133/78 | HR 78 | Temp 97.2°F | Resp 18 | Wt 164.4 lb

## 2022-04-29 DIAGNOSIS — Z515 Encounter for palliative care: Secondary | ICD-10-CM

## 2022-04-29 DIAGNOSIS — Z5112 Encounter for antineoplastic immunotherapy: Secondary | ICD-10-CM | POA: Diagnosis present

## 2022-04-29 DIAGNOSIS — C187 Malignant neoplasm of sigmoid colon: Secondary | ICD-10-CM | POA: Insufficient documentation

## 2022-04-29 DIAGNOSIS — Z79899 Other long term (current) drug therapy: Secondary | ICD-10-CM | POA: Diagnosis not present

## 2022-04-29 DIAGNOSIS — C661 Malignant neoplasm of right ureter: Secondary | ICD-10-CM | POA: Diagnosis present

## 2022-04-29 DIAGNOSIS — Z7189 Other specified counseling: Secondary | ICD-10-CM

## 2022-04-29 DIAGNOSIS — D509 Iron deficiency anemia, unspecified: Secondary | ICD-10-CM | POA: Diagnosis not present

## 2022-04-29 LAB — COMPREHENSIVE METABOLIC PANEL
ALT: 17 U/L (ref 0–44)
AST: 31 U/L (ref 15–41)
Albumin: 3.3 g/dL — ABNORMAL LOW (ref 3.5–5.0)
Alkaline Phosphatase: 112 U/L (ref 38–126)
Anion gap: 7 (ref 5–15)
BUN: 19 mg/dL (ref 8–23)
CO2: 23 mmol/L (ref 22–32)
Calcium: 8.3 mg/dL — ABNORMAL LOW (ref 8.9–10.3)
Chloride: 105 mmol/L (ref 98–111)
Creatinine, Ser: 1.04 mg/dL (ref 0.61–1.24)
GFR, Estimated: >60 mL/min (ref >60)
Glucose, Bld: 154 mg/dL — ABNORMAL HIGH (ref 70–99)
Potassium: 3.7 mmol/L (ref 3.5–5.1)
Sodium: 135 mmol/L (ref 135–145)
Total Bilirubin: 0.4 mg/dL (ref 0.3–1.2)
Total Protein: 7.4 g/dL (ref 6.5–8.1)

## 2022-04-29 LAB — CBC WITH DIFFERENTIAL/PLATELET
Abs Immature Granulocytes: 0.02 10*3/uL (ref 0.00–0.07)
Basophils Absolute: 0.1 10*3/uL (ref 0.0–0.1)
Basophils Relative: 1 %
Eosinophils Absolute: 0.4 10*3/uL (ref 0.0–0.5)
Eosinophils Relative: 6 %
HCT: 38.8 % — ABNORMAL LOW (ref 39.0–52.0)
Hemoglobin: 12.2 g/dL — ABNORMAL LOW (ref 13.0–17.0)
Immature Granulocytes: 0 %
Lymphocytes Relative: 20 %
Lymphs Abs: 1.1 10*3/uL (ref 0.7–4.0)
MCH: 24.6 pg — ABNORMAL LOW (ref 26.0–34.0)
MCHC: 31.4 g/dL (ref 30.0–36.0)
MCV: 78.4 fL — ABNORMAL LOW (ref 80.0–100.0)
Monocytes Absolute: 0.4 10*3/uL (ref 0.1–1.0)
Monocytes Relative: 7 %
Neutro Abs: 3.8 10*3/uL (ref 1.7–7.7)
Neutrophils Relative %: 66 %
Platelets: 205 10*3/uL (ref 150–400)
RBC: 4.95 MIL/uL (ref 4.22–5.81)
RDW: 17 % — ABNORMAL HIGH (ref 11.5–15.5)
WBC: 5.8 10*3/uL (ref 4.0–10.5)
nRBC: 0 % (ref 0.0–0.2)

## 2022-04-29 LAB — TSH: TSH: 4.342 u[IU]/mL (ref 0.350–4.500)

## 2022-04-29 MED ORDER — SODIUM CHLORIDE 0.9 % IV SOLN
Freq: Once | INTRAVENOUS | Status: AC
  Filled 2022-04-29: qty 250

## 2022-04-29 MED ORDER — HEPARIN SOD (PORK) LOCK FLUSH 100 UNIT/ML IV SOLN
500.0000 [IU] | Freq: Once | INTRAVENOUS | Status: AC | PRN
  Administered 2022-04-29: 500 [IU]
  Filled 2022-04-29: qty 5

## 2022-04-29 MED ORDER — SODIUM CHLORIDE 0.9 % IV SOLN
200.0000 mg | Freq: Once | INTRAVENOUS | Status: AC
  Administered 2022-04-29: 200 mg via INTRAVENOUS
  Filled 2022-04-29: qty 8

## 2022-04-29 NOTE — Progress Notes (Signed)
Patient here today for follow up and treatment consideration regarding urothelial cancer, CT results. Patient denies any concerns.

## 2022-04-29 NOTE — Progress Notes (Signed)
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  Is alone/in a wheelchair.  Chad [name] 61 y.o.  male above history of stage IV-ureteral cancer/history of stage III colon cancer right side; recurrent sigmoid colon cancer [un-resected/under surveillance] and multiple other comorbidities currently on Keytruda is here for follow-up/review results of the CT scan.  Denies any worsening abdominal pain.  Any nausea vomiting or constipation.  No blood in stools or black-colored stools.  No  hematuria.  Chronic shortness of breath.  Not any worse.   Review of Systems  Constitutional:  Positive for malaise/fatigue. Negative for chills, diaphoresis, fever and weight loss.  HENT:  Negative for nosebleeds and sore throat.   Eyes:  Negative for double vision.  Respiratory:  Positive for cough and shortness of breath. Negative for hemoptysis and wheezing.   Cardiovascular:  Negative for chest pain, palpitations and orthopnea.  Gastrointestinal:  Positive for constipation. Negative for abdominal pain, blood in stool, diarrhea, heartburn, melena, nausea and vomiting.  Genitourinary:  Negative for dysuria, frequency and urgency.  Musculoskeletal:  Positive for back pain and joint pain.  Skin: Negative.  Negative for itching and rash.  Neurological:  Positive for focal weakness. Negative for dizziness, tingling, weakness and headaches.       Chronic left-sided weakness upper than lower extremity.  Endo/Heme/Allergies:  Does not bruise/bleed easily.  Psychiatric/Behavioral:  Negative for depression. The patient is not nervous/anxious and does not have insomnia.      MEDICAL HISTORY:  Past Medical History:  Diagnosis Date   Anemia    Anxiety    ARF (acute respiratory failure) (HCC)    Bladder cancer (HCC)    COPD (chronic obstructive pulmonary disease) ([place])    Depression    Dysphagia    Family history of colon cancer    Family history of kidney cancer    Family history of leukemia    Family history of prostate cancer    GERD (gastroesophageal reflux disease)    Hepatitis    chronic hep c   Hydronephrosis    Hydronephrosis with ureteral stricture    Hyperlipidemia    Knee  pain    Left   Malignant neoplasm of colon (HCC)    Nerve pain    Peripheral vascular disease (HCC)    Prostate cancer (HCC)    Stroke (HCC)    Ureteral cancer, right ([place])    Urinary frequency    Venous hypertension of both lower extremities     SURGICAL HISTORY: Past Surgical History:   Procedure Laterality Date   COLON SURGERY     En bloc extended right hemicolectomy 07/2017   COLONOSCOPY WITH PROPOFOL N/A 11/06/2020   Procedure: COLONOSCOPY WITH PROPOFOL;  Surgeon: Jonathon Bellows, MD;  Location: [facility] ENDOSCOPY;  Service: Gastroenterology;  Laterality: N/A;   COLONOSCOPY WITH PROPOFOL N/A 07/31/2021   Procedure: COLONOSCOPY WITH PROPOFOL;  Surgeon: Ismael Bellow, MD;  Location: [facility] ENDOSCOPY;  Service: Endoscopy;  Laterality: N/A;   CYSTOSCOPY W/ RETROGRADES Right 08/30/2018   Procedure: CYSTOSCOPY WITH RETROGRADE PYELOGRAM;  Surgeon: Hollice Espy, MD;  Location: [facility] ORS;  Service: Urology;  Laterality: Right;   CYSTOSCOPY WITH STENT PLACEMENT Right 04/25/2018   Procedure: CYSTOSCOPY WITH STENT PLACEMENT;  Surgeon: Hollice Espy, MD;  Location: [facility] ORS;  Service: Urology;  Laterality: Right;   CYSTOSCOPY WITH STENT PLACEMENT Right 08/30/2018   Procedure: [place];  Surgeon: Hollice Espy, MD;  Location: [facility] ORS;  Service: Urology;  Laterality: Right;   CYSTOSCOPY WITH STENT PLACEMENT Right 03/07/2019   Procedure: CYSTOSCOPY WITH STENT Exchange;  Surgeon: Hollice Espy, MD;  Location: [facility] ORS;  Service: Urology;  Laterality: Right;   CYSTOSCOPY WITH STENT PLACEMENT Right 11/21/2019   Procedure: CYSTOSCOPY WITH STENT Exchange;  Surgeon: Hollice Espy, MD;  Location: [facility] ORS;  Service: Urology;  Laterality: Right;   LOWER EXTREMITY ANGIOGRAPHY Left 05/23/2019   Procedure: LOWER EXTREMITY ANGIOGRAPHY;  Surgeon: Algernon Huxley, MD;  Location: [name] CV LAB;  Service: Cardiovascular;  Laterality: Left;   LOWER EXTREMITY ANGIOGRAPHY Right 05/30/2019   Procedure: LOWER EXTREMITY ANGIOGRAPHY;  Surgeon: Algernon Huxley, MD;  Location: [place] CV LAB;  Service: Cardiovascular;  Laterality: Right;   LOWER EXTREMITY ANGIOGRAPHY Right 02/13/2020   Procedure: LOWER EXTREMITY ANGIOGRAPHY;  Surgeon: Algernon Huxley, MD;  Location: [place] CV LAB;   Service: Cardiovascular;  Laterality: Right;   LOWER EXTREMITY ANGIOGRAPHY Left 02/20/2020   Procedure: LOWER EXTREMITY ANGIOGRAPHY;  Surgeon: Algernon Huxley, MD;  Location: [place] CV LAB;  Service: Cardiovascular;  Laterality: Left;   PORTA CATH INSERTION N/A 02/28/2019   Procedure: PORTA CATH INSERTION;  Surgeon: Algernon Huxley, MD;  Location: [place] CV LAB;  Service: Cardiovascular;  Laterality: N/A;   tumor removed       SOCIAL HISTORY: Social History   Socioeconomic History   Marital status: Single    Spouse name: Not on file   Number of children: Not on file   Years of education: Not on file   Highest education level: Not on file  Occupational History   Not on file  Tobacco Use   Smoking status: Every Day    Packs/day: 1.00    Types: Cigarettes   Smokeless tobacco: Never  Vaping Use   Vaping Use: Never used  Substance and Sexual Activity   Alcohol use: Not Currently   Drug use: Not Currently   Sexual activity: Not Currently  Other Topics Concern   Not on file  Social History Narrative    used to live [place]; moved  To [place]- end of April 2019; in Nursing home; 1pp/day; quit alcohol. 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        Family- dad- prostate ca [at 36y]; brother- 50 died of prostate cancer; brother- 26- no cancers [[place]]; sonGerald Stabs [[place]];[name]-32y prostate ca [facility] [place]]; daughter- 15 [[place]]; another daughter 84 [[place]/addict]. will refer genetics counseling. Given MSI- abnormal; highly suspicious of Lynch syndrome.  Patient's son [name] aware of high possible lynch syndrome.   Social Determinants of Health   Financial Resource Strain: Not on file  Food Insecurity: Not on file  Transportation Needs: Not on file  Physical Activity: Not on file  Stress: Not on file  Social Connections: Not on file  Intimate Partner Violence: Not on file    FAMILY HISTORY: Family History  Problem Relation Age of Onset   Prostate cancer Father 50   Cancer Brother 60        unsure type   Cancer Paternal Uncle        unsure type   Cancer Maternal Grandmother        unsure type   Cancer Paternal Grandmother        unsure type   Kidney cancer Paternal Grandfather    Cancer Other        unsure types   Leukemia Son    Cancer Son        other cancers, possibly colon    ALLERGIES:  is allergic to penicillins.  MEDICATIONS:  Current Outpatient Medications  Medication Sig Dispense Refill   acetaminophen (TYLENOL) 325 MG tablet Take 650 mg by mouth 3 (three) times daily.      aspirin EC 81 MG tablet Take 1 tablet (81 mg total) by mouth daily. 150 tablet 2   Calcium Carbonate Antacid 600 MG chewable tablet Chew 2 tablets (1,200 mg total) by mouth daily. 90 tablet 3   carboxymethylcellulose 1 % ophthalmic solution Apply 1 drop to eye 2 (two) times daily.     cholecalciferol (VITAMIN D3) 25 MCG (1000 UNIT) tablet Take 1 tablet (1,000 Units total) by mouth daily. 1 tablet 3   clopidogrel (PLAVIX) 75 MG tablet Take 1 tablet (75 mg total) by mouth daily. 30 tablet 11   cyclobenzaprine (FLEXERIL) 10 MG tablet Take 10 mg by mouth at bedtime.     escitalopram (LEXAPRO) 5 MG tablet Take 5 mg by mouth daily.     gabapentin (NEURONTIN) 100 MG capsule Take 100 mg by mouth 3 (three) times daily.     gentamicin ointment (GARAMYCIN) 0.1 % Apply 1 application. topically 3 (three) times daily.     levothyroxine (SYNTHROID) 25 MCG tablet Take 1 tablet (25 mcg total) by mouth daily before breakfast. 30 tablet 1   levothyroxine (SYNTHROID) 25 MCG tablet Take 1 tablet (25 mcg total) by mouth daily before breakfast. 30 tablet 1   mirtazapine (REMERON) 7.5 MG tablet Take 7.5 mg by mouth at bedtime.     Oxycodone HCl 10 MG TABS Take 10 mg by mouth every 6 (six) hours as needed for pain.     pantoprazole (PROTONIX) 40 MG tablet Take 40 mg by mouth daily.     senna-docusate (SENOKOT-S) 8.6-50 MG tablet Take 1 tablet by mouth daily.     tamsulosin (FLOMAX) 0.4 MG CAPS capsule Take 1  capsule (0.4 mg total) by mouth daily after supper. 30 capsule 3   atorvastatin (LIPITOR) 10 MG tablet Take 1 tablet (10 mg total) by mouth daily. 30 tablet 11   No current facility-administered medications for this visit.   Facility-Administered  Medications Ordered in Other Visits  Medication Dose Route Frequency Provider Last Rate Last Admin   heparin lock flush 100 unit/mL  500 Units Intracatheter Once PRN Cammie Sickle, MD       pembrolizumab [facility]) 200 mg in sodium chloride 0.9 % 50 mL chemo infusion  200 mg Intravenous Once Cammie Sickle, MD 116 mL/hr at 04/29/22 1015 200 mg at 04/29/22 1015      .  PHYSICAL EXAMINATION: ECOG PERFORMANCE STATUS: 1 - Symptomatic but completely ambulatory  Vitals:   04/29/22 0859  BP: 133/78  Pulse: 78  Resp: 18  Temp: (!) 97.2 F (36.2 C)  SpO2: 97%   Filed Weights   04/29/22 0859  Weight: 164 lb 6.4 oz (74.6 kg)      Physical Exam HENT:     Head: Normocephalic and atraumatic.     Mouth/Throat:     Pharynx: No oropharyngeal exudate.  Eyes:     Pupils: Pupils are equal, round, and reactive to light.  Cardiovascular:     Rate and Rhythm: Normal rate and regular rhythm.  Pulmonary:     Effort: No respiratory distress.     Breath sounds: No wheezing.  Abdominal:     General: Bowel sounds are normal. There is no distension.     Palpations: Abdomen is soft. There is no mass.     Tenderness: There is no abdominal tenderness. There is no guarding or rebound.  Musculoskeletal:        General: No tenderness. Normal range of motion.     Cervical back: Normal range of motion and neck supple.  Skin:    General: Skin is warm.     Comments: Bilateral lower extremity ulcerations noted.  Pulses intact bilaterally.  Cystlike lesion noted right elbow-nontender.  No erythema noted.  Neurological:     Mental Status: He is oriented to person, place, and time.     Comments: Sleepy but easily arousable.  Chronic weakness  left upper and lower extremity.  Psychiatric:        Mood and Affect: Affect normal.      LABORATORY DATA:  I have reviewed the data as listed Lab Results  Component Value Date   WBC 5.8 04/29/2022   HGB 12.2 (L) 04/29/2022   HCT 38.8 (L) 04/29/2022   MCV 78.4 (L) 04/29/2022   PLT 205 04/29/2022   Recent Labs    03/17/22 1247 04/08/22 0838 04/29/22 0843  NA 137 137 135  K 3.8 3.8 3.7  CL 107 109 105  CO2 _0 GLUCOSE 114* 129* 154*  BUN _1 CREATININE 0.96 0.98 1.04  CALCIUM 8.2* 8.4* 8.3*  GFRNONAA >60 >60 >60  PROT 7.2 7.2 7.4  ALBUMIN 3.4* 3.4* 3.3*  AST 32 37 31  ALT _2 ALKPHOS 109 110 112  BILITOT 0.4 0.6 0.4    ASSESSMENT & PLAN:   Ureteral cancer, right (HCC) # High-grade urothelial cancer/cytology; likely of the right renal pelvis /upper ureter. On keytruda-SEP, 22nd 2023-radiologically mild progressive disease noted in the proximal right ureter; however overall clinically stable. See below-regarding sigmoid cancer-   # Proceed with  [place] today; Labs today reviewed; Labs today reviewed;  acceptable for treatment today. STABLE. TSH AUG 2023--WNL.   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 SEP 22nd, 2023 CT scan- Focal wall thickening in the proximal sigmoid colon; with worsening of pericolonic lymph  nodes .  I again reviewed with the patient that he is a poor candidate for surgery; s/p evaluation with Dr. Bary Castilla- over all clinically STABLE.   # Iron deficiency anemia-hemoglobin 11-12; FEB 2023- iron studies-7/ferritin-18 LOW; s/p  venofer-hemoglobin 12.3.  # Hypocalcemia- Mild- on Ca+vit D; JUNE 2023- 25-OH vit D levels: 30. STABLE.    #DISPOSITION:  #  [name] today  # follow up in 3 weeks-; MD- labs-cbc/cmp; Keytruda; venofer- -Dr.B.  # I reviewed the blood work- with the patient in detail; also reviewed the imaging independently  [as summarized above]; and with the patient in detail.       All questions were answered. The patient knows to call the clinic with any problems, questions or concerns.    Cammie Sickle, MD 04/29/2022 10:18 AM

## 2022-04-29 NOTE — Assessment & Plan Note (Addendum)
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 On keytruda-SEP, 22nd 2023-radiologically mild progressive disease noted in the proximal right ureter; however overall clinically stable. See below-regarding sigmoid cancer-   # Proceed with  [place] today; Labs today reviewed; Labs today reviewed;  acceptable for treatment today. STABLE. TSH AUG 2023--WNL.   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 SEP 22nd, 2023 CT scan- Focal wall thickening in the proximal sigmoid colon; with worsening of pericolonic lymph nodes .  I again reviewed with the patient that he is a poor candidate for surgery; s/p evaluation with Dr. Bary Castilla- over all clinically STABLE.   # Iron deficiency anemia-hemoglobin 11-12; FEB 2023- iron studies-7/ferritin-18 LOW; s/p  venofer-hemoglobin 12.3.  # Hypocalcemia- Mild- on Ca+vit D; JUNE 2023- 25-OH vit D levels: 30. STABLE.    #DISPOSITION:  #  [name] today  # follow up in 3 weeks-; MD- labs-cbc/cmp; Keytruda; venofer- -Dr.B.  # I reviewed the blood work- with the patient in detail; also reviewed the imaging independently [as summarized above]; and with the patient in detail.

## 2022-05-01 LAB — T4: T4, Total: 8.3 ug/dL (ref 4.5–12.0)

## 2022-05-05 ENCOUNTER — Encounter: Payer: Medicaid Other | Attending: Physician Assistant | Admitting: Physician Assistant

## 2022-05-05 DIAGNOSIS — M1990 Unspecified osteoarthritis, unspecified site: Secondary | ICD-10-CM | POA: Diagnosis not present

## 2022-05-05 DIAGNOSIS — L299 Pruritus, unspecified: Secondary | ICD-10-CM | POA: Insufficient documentation

## 2022-05-05 DIAGNOSIS — I872 Venous insufficiency (chronic) (peripheral): Secondary | ICD-10-CM | POA: Diagnosis not present

## 2022-05-05 DIAGNOSIS — G629 Polyneuropathy, unspecified: Secondary | ICD-10-CM | POA: Insufficient documentation

## 2022-05-05 DIAGNOSIS — I739 Peripheral vascular disease, unspecified: Secondary | ICD-10-CM | POA: Insufficient documentation

## 2022-05-05 DIAGNOSIS — I2510 Atherosclerotic heart disease of native coronary artery without angina pectoris: Secondary | ICD-10-CM | POA: Diagnosis not present

## 2022-05-05 DIAGNOSIS — I69354 Hemiplegia and hemiparesis following cerebral infarction affecting left non-dominant side: Secondary | ICD-10-CM | POA: Insufficient documentation

## 2022-05-05 DIAGNOSIS — L97322 Non-pressure chronic ulcer of left ankle with fat layer exposed: Secondary | ICD-10-CM | POA: Diagnosis present

## 2022-05-05 NOTE — Progress Notes (Addendum)
JLYN, BRACAMONTE ([id number]) Visit Report for 05/05/2022 Chief Complaint Document Details Patient Name: Chad [name], Chad [name] Date of Service: 05/05/2022 10:15 AM Medical Record Number: [id number] Patient Account Number: [id number] Date of Birth/Sex: Jul 12, 1961 (61 y.o. M) Treating RN: Carlene Coria Primary Care Provider: [name] Other Clinician: Referring Provider: [name] Treating Provider/Extender: Skipper Cliche in Treatment: 21 Information Obtained from: Patient Chief Complaint Left ankle ulcers Electronic Signature(s) Signed: 05/05/2022 10:37:40 AM By: Worthy Keeler PA-C Entered By: Worthy Keeler on 05/05/2022 10:37:40 Chad [name] ([id number]) -------------------------------------------------------------------------------- HPI Details Patient Name: Chad [name] Date of Service: 05/05/2022 10:15 AM Medical Record Number: [id number] Patient Account Number: [id number] Date of Birth/Sex: [birth year] (61 y.o. M) Treating RN: Carlene Coria Primary Care Provider: [name] Other Clinician: Referring Provider: [name] Treating Provider/Extender: Skipper Cliche in Treatment: 21 History of Present Illness HPI Description: 10/08/18 on evaluation today patient actually presents to our office for initial evaluation concerning wounds that he has of the bilateral lower extremities. 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 He also has bilateral venous stasis. He apparently has been homeless currently following discharge from the hospital apparently he has been placed at [facility] which is is a skilled nursing facility locally. Nonetheless fortunately he does not show any signs of infection at this time which is good news. In fact several of the wound actually appears to be showing some signs of  improvement already in my pinion. There are a couple areas in the left leg in particular there likely gonna require some sharp debridement to help clear away some necrotic tissue and help with more sufficient healing. No fevers, chills, nausea, or vomiting noted at this time. 10/15/18 on evaluation today patient actually appears to be doing very well in regard to his bilateral lower extremities. He's been tolerating the dressing changes without complication. Fortunately there does not appear to be any evidence of active infection at this time which is great news. Overall I'm actually very pleased with how this has progressed in just one visits time. Readmission: 08/14/2020 upon evaluation today patient presents for re-evaluation here in our clinic. He is having issues with his left ankle region as well as his right toe and his right heel. He tells me that the toe and heel actually began as a area that was itching that he was scratching and then subsequently opened up into wounds. These may have been abscess areas I presume based on what I am seeing currently. With regard to his left ankle region he tells me this was a similar type occurrence although he does have venous stasis this very well may be more of a venous leg ulcer more than anything. Nonetheless I do believe that the patient would benefit from appropriate and aggressive wound care to try to help get things under better control here. He does have history of a stroke on the left side affecting him to some degree there that he is able to stand although he does have some residual weakness. Otherwise again the patient does have chronic venous insufficiency as previously noted. His arterial studies most recently obtained showed that he had an ABI on the right of 1.16 with a TBI of 0.52 and on the left and ABI of 1.14 with a TBI of 0.81. That was obtained on 06/19/2020. 08/28/2020 upon evaluation today patient appears to be doing  decently well in  regard to his wounds in general. He has been tolerating the dressing changes without complication. Fortunately there does not appear to be any signs of active infection which is great news. With that being said I think the [facility] is doing a good job I would recommend that we likely continue with that currently. 09/11/2020 upon evaluation today patient's wounds did not appear to be doing too poorly but again he is not really showing signs of significant improvement with regard to any of the wounds on the right. None of them have Hydrofera Blue on them I am not exactly sure why this is not being followed as the facility did not contact us to let us know of any issues with obtaining dressings or otherwise. With that being said he is supposed to be using Hydrofera Blue on both of the wounds on the right foot as well as the ankle wound on the left side. 09/18/2020 upon evaluation today patient appears to be doing poorly with regard to his wounds. Again right now the left ankle in particular showed signs of extreme maceration. Apparently he was told by someone with staff at [facility] they could not get the [facility]. With that being said this is something that is never been relayed to [place] one way or another. Also the patient subsequently has not supposed to have a border gauze dressing on. He should have an ABD pad and roll gauze to secure as this drains much too much just to have a border gauze dressing to cover. Nonetheless the fact that they are not using the appropriate dressing is directly causing deterioration of the left ankle wound it is significantly worse today compared to what it was previous. I did attempt to call [facility] while the patient was here I called three times and got no one to even pick up the phone. After this I had my for an office coordinator call and she was able to finally get through and leave a message with the D ON as of dictation of this note  which is roughly about an hour and a half later I still have not been able to speak with anyone at the facility. 09/25/2020 upon evaluation today patient actually showing signs of good improvement which is excellent news. He has been tolerating the dressing changes without complication. Fortunately there is no signs of active infection which is great news. No fevers, chills, nausea, vomiting, or diarrhea. I do feel like the facility has been doing a much better job at taking care of him as far as the dressings are concerned. However the director of nursing never did call me back. 10/09/2020 upon evaluation today patient appears to be doing well with regard to his wound. The toe ulcer did require some debridement but the other 2 areas actually appear to be doing quite well. 10/19/2020 upon evaluation today patient actually appears to be doing very well in regard to his wounds. In fact the heel does appear to be completely healed. The toe is doing better in the medial ankle on the left is also doing better. Overall I think he is headed in the right direction. 10/26/2020 upon evaluation today patient appears to be doing well with regard to his wound. He is showing signs of improvement which is great news and overall I am very pleased with where things stand today. No fevers, chills, nausea, vomiting, or diarrhea. 11/02/2020 upon evaluation today patient appears to be doing well with regard  to his wounds. He has been tolerating the dressing changes without complication overall I am extremely pleased with where things stand today. He in regard to the toe is almost completely healed and the medial ankle on the left is doing much better. 11/09/2020 upon evaluation today patient appears to be doing a little poorly in regard to his left medial ankle ulcer. Fortunately there does not appear to be any signs of systemic infection but unfortunately locally he does appear to be infected in fact he has blue-green drainage  consistent with Pseudomonas. DEMONTRAE, GILBERT ([id number]) 11/16/2020 upon evaluation today patient appears to be doing well with regard to his wound. It actually appears to be doing better. I did place him on gentamicin cream since the Cipro was actually resistant even though he was positive for Pseudomonas on culture. Overall I think that he does seem to be doing better though I am unsure whether or not they have actually been putting the cream on. The patient is not sure that we did talk to the nurse directly and she was going to initiate that treatment. Fortunately there does not appear to be any signs of active infection at this time. No fevers, chills, nausea, vomiting, or diarrhea. 4/28; the area on the right second toe is close to healed. Left medial ankle required debridement 12/07/2020 upon evaluation today patient appears to be doing well with regard to his wounds. In fact the right second toe appears to be completely healed which is great news. Fortunately there does not appear to be any signs of active infection at this time which is also great news. I think we can probably discontinue the gentamicin on top of everything else. 12/14/2020 upon evaluation today patient appears to be doing well with regard to his wound. He is making good progress and overall very pleased with where things stand today. There is no signs of active infection at this time which is great news. 12/28/2020 upon evaluation today patient appears to be doing well with regard to his wounds. He has been tolerating the dressing changes without complication. Fortunately there is no signs of active infection at this time. No fevers, chills, nausea, vomiting, or diarrhea. 12/28/2020 upon evaluation today patient's wound bed actually showed signs of excellent improvement. He has great epithelization and granulation I do not see any signs of infection overall I am extremely pleased with where things stand at this point. No fevers,  chills, nausea, vomiting, or diarrhea. 01/11/2021 upon evaluation today patient appears to be doing well with regard to his wound on his leg. He has been tolerating the dressing changes without complication. Fortunately there does not appear to be any signs of active infection which is great news. No fevers, chills, nausea, vomiting, or diarrhea. 01/25/2021 upon evaluation today patient appears to be doing well with regard to his wound. He has been tolerating the dressing changes without complication. Fortunately the collagen seems to be doing a great job which is excellent news. No fevers, chills, nausea, vomiting, or diarrhea. 02/08/2021 upon evaluation today patient's wound is actually looking a little bit worse especially in the periwound compared to previous. Fortunately there does not appear to be any signs of infection which is great news with that being said he does have some irritation around the periphery of the wound which has me more concerned. He actually had a dressing on that had not been changed in 3 days. He also is supposed to have daily dressing changes. With regard to the dressing  applied he had a silver alginate dressing and silver collagen is what is recommended and ordered. He also had no Desitin around the edges of the wound in the periwound region although that is on the order inspect to be done as well. In general I was very concerned I did contact [facility] actually spoke with Magda Paganini who is the scheduling individual and subsequently she stated that she would pass the information to the D ON apparently the D ON was not available to talk to me when I call today. 02/18/2021 upon evaluation today patient's wound is actually showing signs of improvement. Fortunately there does not appear to be any evidence of infection which is great news overall I am extremely pleased with where things stand today. No fevers, chills, nausea, vomiting, or diarrhea. 8/3; patient presents for  1 week follow-up. He has no issues or complaints today. He denies signs of infection. 03/11/2021 upon evaluation today patient appears to be doing well with regard to his wound. He does have a little bit of slough noted on the surface of the wound but fortunately there does not appear to be any signs of active infection at this time. No fevers, chills, nausea, vomiting, or diarrhea. 03/18/2021 upon evaluation today patient appears to be doing well with regard to his wound. He has been tolerating the dressing changes without complication. There was a little irritation more proximal to where the wound was that was not noted last week but nonetheless this is very superficial just seems to be more irritation we just need to make sure to put a good amount of the zinc over the area in my opinion. Otherwise he does not seem to be doing significantly worse at all which is great news. 03/25/2021 upon evaluation today patient appears to be doing well with regard to his wound. He is going require some sharp debridement today to clear with some of the necrotic debris. I did perform this today without complication postdebridement wound bed appears to be doing much better this is great news. 04/08/2021 upon evaluation today patient appears to be doing decently well in regard to his wound although the overall measurement is not significantly smaller compared to previous. It is gone down a little bit but still the facility continues to not really put the appropriate dressings in place in fact he was supposed to have collagen we think he probably had more of an allergy to At this point. Fortunately there does not appear to be any signs of active infection systemically though locally I do not see anything on initial visualization either as far as erythema or warmth. 04/15/2021 upon evaluation today patient appears to be doing well with regard to his wound. He is actually showing signs of improvement. I did place him on  antibiotics last week, Cipro. He has been taking that 2 times a day and seems to be tolerating it very well. I do not see any evidence of worsening and in fact the overall appearance of the wound is smaller today which is also great news. 9/26; left medial ankle chronic venous insufficiency wound is improved. Using Hydrofera Blue 10/10; left medial ankle chronic venous insufficiency. Wound has not changed much in appearance completely nonviable surface. Apparently there have been problems getting the right product on the wound at the facility although he came in with [facility] on today 05/14/2021 upon evaluation today patient appears to be doing well with regard to his wound. I think he is making progress here which  is good news. Fortunately there does not appear to be any signs of active infection at this time. No fevers, chills, nausea, vomiting, or diarrhea. 05/20/2021 upon evaluation today patient appears to be doing well with regard to his wound. He is showing signs of good improvement which is great news. There does not appear to be any evidence of active infection which is also excellent news. No fevers, chills, nausea, vomiting, or diarrhea. 05/28/2021 upon evaluation today patient appears to be doing quite well. There does not appear to be any signs of active infection at this time which is great news. Overall I am extremely pleased with where things stand today. I think he is headed in the right direction. 06/11/2021 upon evaluation today patient appears to be doing well with regard to his left ankle ulcer and poorly in regard to the toe ulcer on the second toe right foot. This appears to show signs of joint exposure. Apparently this has been present for 1 to 2 months although he kept [facility] ([id number]) forgetting to tell me about it. That is unfortunate as right now it definitely appears to be doing significantly worse than what I would like to see. There does not appear to  be any signs of active infection systemically though locally I am concerned about the possibility of infection the toe is quite red. Again no one from the facility ever contacted [place] to advise that this was going on in the interim either. 06/17/2021 upon evaluation today patient presents for follow-up I did review his x-ray which showed a navicular bone fracture I am unsure of the chronicity of this. Subsequently he also had osteomyelitis of the toe which was what I was more concerned about this did not show up on x-ray but did show up on the pathology scrapings. This was listed as acute osteomyelitis. Nonetheless at this point I think that the antibiotic treatment is the best regimen to go with currently. The patient is in agreement with that plan. Nonetheless he has initially 30 days of doxycycline off likely extend that towards the end of the treatment cycle that will be around the middle of December for an additional 2 weeks. That all depends on how well he continues to heal. Nonetheless based on what I am seeing in the foot I did want a proceed with an MRI as well which I think will be helpful to identify if there is anything else that needs to be addressed from the standpoint of infection. 06/24/2021 upon evaluation today patient appears to be doing pretty well in regards to his wounds. I think both are actually showing signs of improvement which is good I did review his MRI today which did show signs of osteomyelitis of the middle and proximal phalanx on his right foot of the affected toe. With that being said this is actually showing signs of significant improvement today already with the antibiotic therapy I think the redness is also improved. Overall I think that we just need to give this some time with appropriate wound care we will see how things go potentially hyperbarics could be considered. 07/02/2021 upon inspection today patient actually appears to be doing well in regard to his left  ankle which is getting very close to complete resolution of pleased in that regard. Unfortunately he is continuing to have issues with his second toe right foot and this seems to still be very painful for him. Recommend he try something different from the standpoint of antibiotics. 07/15/2021 upon evaluation  today patient appears to be doing actually pretty well in regard to his foot. This is actually showing signs of significant improvement which is great news. Overall I feel like the patient is improving both in regard to the second toe as well as the ankle on the left. With that being said the biggest issue that I do see currently is that he is needing to have a refill of the doxycycline that we previously treated him with. He also did see podiatry they are not going to recommend any amputation at this point since he seems to be doing quite well. For that reason we just need to keep things under control from an infection standpoint. 08/01/2021 upon evaluation today patient appears to be doing well with regard to his wound. He has been tolerating the dressing changes without complication. Fortunately there does not appear to be any evidence of active infection locally nor systemically at this point. In fact I think everything is doing excellent in fact his second toe on the right foot is almost healed and the ankle on the left ankle region is actually very close to being healed as well. 08/08/2021 upon evaluation today patient appears to be doing well with regard to his wound. He has been tolerating the dressing changes without complication. Fortunately I do not see any signs of active infection at this time. Readmission: 12-06-2021 upon evaluation today patient presents for reevaluation here in the clinic he does tell me that he was being seen in facility at [place] by a provider that was coming in. He is not sure who this was. He tells me however that the wound seems to have gotten worse  even compared to where it was when we last saw him at this point. With that being said I do believe that he is likely going need ongoing wound care here in the clinic and I do believe that we need to be the ones to frontline this since his wound does seem to be getting worse not better at this point. He voiced understanding. He is also in agreement with this plan and feels more comfortable coming here she tells me. Patient's medical history really has not changed since his prior admission he was only gone since January. 12-27-2021 upon evaluation today patient appears to be doing well with regard to his wound they did run out of the [facility] so they did not put anything on just an ABD pad with gentamicin. Still we are seeing some signs of good improvement here with some new epithelization which is great news. 01-10-2022 upon evaluation today patient appears to be doing well with regard to his wounds and he is going require some sharp debridement but overall seems to be making good progress. Fortunately I do not see any evidence of active infection locally or systemically at this time which is great news. 01-24-2022 upon evaluation today patient appears to be doing well with regard to his wound. The facility actually came and dropped him off early and he had another appointment at the hospital and then they just brought him over here and this was still hours before his appointment this afternoon. For that reason we did do our best to work him in this morning and fortunately had some space to make this happen. With that being said patient's wound does seem to be making progress here and I am very pleased in that regard I do not see any signs of active infection locally or systemically at this  time. 02-07-2022 patient appears to be doing well currently in regard to his wounds. In fact one of them the more proximal is healed the distal is still open but seems to be doing excellent. Fortunately I do not  see any evidence of active infection locally or systemically at this time which is great news. No fevers, chills, nausea, vomiting, or diarrhea. 7/27; left medial ankle venous. Improving per our intake nurse. We are using Hydrofera Blue under Tubigrip compression. They are changing that at his facility 03-06-2022 upon evaluation today patient appears to be doing well with regard to the his wound he is can require some sharp debridement but seems to be making excellent progress. Fortunately I do not see any evidence of active infection locally or systemically which is great news. 03-27-2022 upon evaluation today patient's wound is actually showing signs of excellent improvement. Fortunately I see no signs of active infection locally or systemically at this time which is great news. No fevers, chills, nausea, vomiting, or diarrhea. 04-21-2022 upon evaluation today patient appears to be doing excellent in regard to his wound in fact this is very close to resolution based on what I am seeing. I do not see any evidence of active infection locally or systemically at this time which is great news and overall I am extremely pleased with where we are today. 05-05-2022 upon evaluation today patient's wound actually appears to potentially be completely healed. Fortunately I do not see any evidence of active infection at this time which is great news and overall I am very pleased I think this needs a little time to toughen up but other than that I really do believe were doing quite well. He is very pleased to hear this its been a long time coming. Chad [name], Chad [name] ([id number]) Electronic Signature(s) Signed: 05/06/2022 5:55:03 PM By: Worthy Keeler PA-C Entered By: Worthy Keeler on 05/06/2022 17:55:03 Chad [name] ([id number]) -------------------------------------------------------------------------------- Physical Exam Details Patient Name: Chad [name] Date of Service: 05/05/2022 10:15 AM Medical  Record Number: [id number] Patient Account Number: [id number] Date of Birth/Sex: 09-07-1960 (61 y.o. M) Treating RN: Carlene Coria Primary Care Provider: [name] Other Clinician: Referring Provider: [name] Treating Provider/Extender: Skipper Cliche in Treatment: 73 Constitutional Well-nourished and well-hydrated in no acute distress. Respiratory normal breathing without difficulty. Psychiatric this patient is able to make decisions and demonstrates good insight into disease process. Alert and Oriented x 3. pleasant and cooperative. Notes Patient's wound bed did require really no sharp debridement at all today and in general I feel like he is doing quite well. I do not see any evidence of infection and overall I think we are on the right track. I believe that we will give it 2 more weeks just to make sure this maintains closure and if he still doing well at that time we will discharge him. Electronic Signature(s) Signed: 05/06/2022 5:55:27 PM By: Worthy Keeler PA-C Entered By: Worthy Keeler on 05/06/2022 17:55:27 Chad [name] ([id number]) -------------------------------------------------------------------------------- Physician Orders Details Patient Name: Chad [name] Date of Service: 05/05/2022 10:15 AM Medical Record Number: [id number] Patient Account Number: [id number] Date of Birth/Sex: April 16, 1961 (61 y.o. 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 Bathing/ Shower/ Hygiene o Wash wounds with antibacterial soap and water. o No tub bath. Anesthetic (Use 'Patient Medications' Section for Anesthetic Order Entry) o Lidocaine applied to wound bed Wound Treatment Wound #12 - Ankle Wound Laterality: Left, Medial, Distal Cleanser: Soap and Water 3 x Per Week/30 Days Discharge Instructions: Gently cleanse wound with antibacterial soap, rinse and pat dry prior to dressing wounds Cleanser: Wound Cleanser 3 x Per Week/30 Days Discharge Instructions: Wash your hands with soap and water. Remove old dressing, discard into plastic bag and place into trash. Cleanse the wound with Wound Cleanser prior to applying a clean dressing using gauze sponges, not tissues or cotton balls. Do not scrub or use excessive force. Pat dry using gauze sponges, not tissue or cotton balls. Peri-Wound Care: Desitin Maximum Strength Ointment 4 (oz) 3 x Per Week/30 Days Discharge Instructions: Apply around wound edges to avoid maceration Topical: Gentamicin 3 x Per Week/30 Days Discharge Instructions: Apply as directed by provider. Primary Dressing: Hydrofera Blue Ready Transfer Foam, 2.5x2.5 (in/in) 3 x Per Week/30 Days Discharge Instructions: Apply Hydrofera Blue Ready to wound bed as directed Secondary Dressing: ABD Pad 5x9 (in/in) 3 x Per Week/30 Days Discharge Instructions: Cover with ABD pad Secured With: Medipore Tape - 57M Medipore H Soft Cloth Surgical Tape, 2x2 (in/yd) 3 x Per Week/30 Days Secured With: [employer] Sterile or Non-Sterile 6-ply 4.5x4 (yd/yd) 3 x Per Week/30 Days Discharge Instructions: Apply Kerlix as directed Secured With: Tubigrip Size C, 2.75x10 (in/yd) 3 x Per Week/30 Days Discharge Instructions: Apply 3 Tubigrip C 3-finger-widths below knee to base of toes to secure dressing and/or for swelling. This can be hand washed and air dried Electronic Signature(s) Signed: 05/06/2022 6:10:21 PM By: Worthy Keeler  PA-C Signed: 05/09/2022 12:25:01 PM By: Carlene Coria RN Entered By: Carlene Coria on 05/05/2022 11:02:42 Chad [name], Chad [name] ([id number]) Chad [name], Chad [name] ([id number]) -------------------------------------------------------------------------------- Problem List Details Patient Name: Chad [name] Date of Service: 05/05/2022 10:15 AM Medical Record Number: [id number] Patient Account Number: [id number] Date of Birth/Sex: 1961-05-22 (61 y.o. M) Treating RN: Carlene Coria Primary Care Provider: [name] Other Clinician: Referring Provider: [name] Treating Provider/Extender: Skipper Cliche in Treatment: 21 Active Problems ICD-10 Encounter Code Description Active Date MDM Diagnosis 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 (idiopathic) with ulcer and inflammation of 12/06/2021 No Yes left lower extremity L97.322 Non-pressure chronic ulcer of left ankle with fat layer exposed 12/06/2021 No Yes I69.354 Hemiplegia and hemiparesis following cerebral infarction affecting left 12/06/2021 No Yes non-dominant side Inactive Problems Resolved Problems Electronic Signature(s) Signed: 05/05/2022 10:37:33 AM By: Worthy Keeler PA-C Entered By: Worthy Keeler on 05/05/2022 10:37:33 Chad [name] ([id number]) -------------------------------------------------------------------------------- Progress Note Details Patient Name: Chad [name] Date of Service: 05/05/2022 10:15 AM Medical Record Number: [id number] Patient Account Number: [id number] Date of Birth/Sex: 10-21-1960 (61 y.o. M) Treating RN: Carlene Coria Primary Care Provider: [name] Other Clinician: Referring Provider: [name] Treating Provider/Extender: Skipper Cliche in Treatment: 21 Subjective Chief Complaint Information obtained from Patient Left ankle ulcers History of Present Illness (HPI) 10/08/18 on evaluation today patient actually presents to our office for initial evaluation concerning wounds that he has of  the bilateral lower extremities. 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 He also has bilateral venous stasis.  He apparently has been homeless currently following discharge from the hospital apparently he has been placed at [facility] which is is a skilled nursing facility locally. Nonetheless fortunately he does not show any signs of infection at this time which is good news. In fact several of the wound actually appears to be showing some signs of improvement already in my pinion. There are a couple areas in the left leg in particular there likely gonna require some sharp debridement to help clear away some necrotic tissue and help with more sufficient healing. No fevers, chills, nausea, or vomiting noted at this time. 10/15/18 on evaluation today patient actually appears to be doing very well in regard to his bilateral lower extremities. He's been tolerating the dressing changes without complication. Fortunately there does not appear to be any evidence of active infection at this time which is great news. Overall I'm actually very pleased with how this has progressed in just one visits time. Readmission: 08/14/2020 upon evaluation today patient presents for re-evaluation here in our clinic. He is having issues with his left ankle region as well as his right toe and his right heel. He tells me that the toe and heel actually began as a area that was itching that he was scratching and then subsequently opened up into wounds. These may have been abscess areas I presume based on what I am seeing currently. With regard to his left ankle region he tells me this was a similar type occurrence although he does have venous stasis this very well may be more of a venous leg ulcer more than anything. Nonetheless I do believe that the patient would  benefit from appropriate and aggressive wound care to try to help get things under better control here. He does have history of a stroke on the left side affecting him to some degree there that he is able to stand although he does have some residual weakness. Otherwise again the patient does have chronic venous insufficiency as previously noted. His arterial studies most recently obtained showed that he had an ABI on the right of 1.16 with a TBI of 0.52 and on the left and ABI of 1.14 with a TBI of 0.81. That was obtained on 06/19/2020. 08/28/2020 upon evaluation today patient appears to be doing decently well in regard to his wounds in general. He has been tolerating the dressing changes without complication. Fortunately there does not appear to be any signs of active infection which is great news. With that being said I think the [employer] is doing a good job I would recommend that we likely continue with that currently. 09/11/2020 upon evaluation today patient's wounds did not appear to be doing too poorly but again he is not really showing signs of significant improvement with regard to any of the wounds on the right. None of them have Hydrofera Blue on them I am not exactly sure why this is not being followed as the facility did not contact us to let us know of any issues with obtaining dressings or otherwise. With that being said he is supposed to be using Hydrofera Blue on both of the wounds on the right foot as well as the ankle wound on the left side. 09/18/2020 upon evaluation today patient appears to be doing poorly with regard to his wounds. Again right now the left ankle in particular showed signs of extreme maceration. Apparently he was told by someone with staff at [facility] they could not get the [facility].  With that being said this is something that is never been relayed to [place] one way or another. Also the patient subsequently has not supposed to have a border gauze  dressing on. He should have an ABD pad and roll gauze to secure as this drains much too much just to have a border gauze dressing to cover. Nonetheless the fact that they are not using the appropriate dressing is directly causing deterioration of the left ankle wound it is significantly worse today compared to what it was previous. I did attempt to call [facility] while the patient was here I called three times and got no one to even pick up the phone. After this I had my for an office coordinator call and she was able to finally get through and leave a message with the D ON as of dictation of this note which is roughly about an hour and a half later I still have not been able to speak with anyone at the facility. 09/25/2020 upon evaluation today patient actually showing signs of good improvement which is excellent news. He has been tolerating the dressing changes without complication. Fortunately there is no signs of active infection which is great news. No fevers, chills, nausea, vomiting, or diarrhea. I do feel like the facility has been doing a much better job at taking care of him as far as the dressings are concerned. However the director of nursing never did call me back. 10/09/2020 upon evaluation today patient appears to be doing well with regard to his wound. The toe ulcer did require some debridement but the other 2 areas actually appear to be doing quite well. 10/19/2020 upon evaluation today patient actually appears to be doing very well in regard to his wounds. In fact the heel does appear to be completely healed. The toe is doing better in the medial ankle on the left is also doing better. Overall I think he is headed in the right direction. 10/26/2020 upon evaluation today patient appears to be doing well with regard to his wound. He is showing signs of improvement which is great news and overall I am very pleased with where things stand today. No fevers, chills, nausea, vomiting,  or diarrhea. 11/02/2020 upon evaluation today patient appears to be doing well with regard to his wounds. He has been tolerating the dressing changes without complication overall I am extremely pleased with where things stand today. He in regard to the toe is almost completely healed and the medial [facility] ([id number]) ankle on the left is doing much better. 11/09/2020 upon evaluation today patient appears to be doing a little poorly in regard to his left medial ankle ulcer. Fortunately there does not appear to be any signs of systemic infection but unfortunately locally he does appear to be infected in fact he has blue-green drainage consistent with Pseudomonas. 11/16/2020 upon evaluation today patient appears to be doing well with regard to his wound. It actually appears to be doing better. I did place him on gentamicin cream since the Cipro was actually resistant even though he was positive for Pseudomonas on culture. Overall I think that he does seem to be doing better though I am unsure whether or not they have actually been putting the cream on. The patient is not sure that we did talk to the nurse directly and she was going to initiate that treatment. Fortunately there does not appear to be any signs of active infection at this time. No fevers, chills, nausea, vomiting, or  diarrhea. 4/28; the area on the right second toe is close to healed. Left medial ankle required debridement 12/07/2020 upon evaluation today patient appears to be doing well with regard to his wounds. In fact the right second toe appears to be completely healed which is great news. Fortunately there does not appear to be any signs of active infection at this time which is also great news. I think we can probably discontinue the gentamicin on top of everything else. 12/14/2020 upon evaluation today patient appears to be doing well with regard to his wound. He is making good progress and overall very pleased with where  things stand today. There is no signs of active infection at this time which is great news. 12/28/2020 upon evaluation today patient appears to be doing well with regard to his wounds. He has been tolerating the dressing changes without complication. Fortunately there is no signs of active infection at this time. No fevers, chills, nausea, vomiting, or diarrhea. 12/28/2020 upon evaluation today patient's wound bed actually showed signs of excellent improvement. He has great epithelization and granulation I do not see any signs of infection overall I am extremely pleased with where things stand at this point. No fevers, chills, nausea, vomiting, or diarrhea. 01/11/2021 upon evaluation today patient appears to be doing well with regard to his wound on his leg. He has been tolerating the dressing changes without complication. Fortunately there does not appear to be any signs of active infection which is great news. No fevers, chills, nausea, vomiting, or diarrhea. 01/25/2021 upon evaluation today patient appears to be doing well with regard to his wound. He has been tolerating the dressing changes without complication. Fortunately the collagen seems to be doing a great job which is excellent news. No fevers, chills, nausea, vomiting, or diarrhea. 02/08/2021 upon evaluation today patient's wound is actually looking a little bit worse especially in the periwound compared to previous. Fortunately there does not appear to be any signs of infection which is great news with that being said he does have some irritation around the periphery of the wound which has me more concerned. He actually had a dressing on that had not been changed in 3 days. He also is supposed to have daily dressing changes. With regard to the dressing applied he had a silver alginate dressing and silver collagen is what is recommended and ordered. He also had no Desitin around the edges of the wound in the periwound region although that is on  the order inspect to be done as well. In general I was very concerned I did contact [facility] actually spoke with Magda Paganini who is the scheduling individual and subsequently she stated that she would pass the information to the D ON apparently the D ON was not available to talk to me when I call today. 02/18/2021 upon evaluation today patient's wound is actually showing signs of improvement. Fortunately there does not appear to be any evidence of infection which is great news overall I am extremely pleased with where things stand today. No fevers, chills, nausea, vomiting, or diarrhea. 8/3; patient presents for 1 week follow-up. He has no issues or complaints today. He denies signs of infection. 03/11/2021 upon evaluation today patient appears to be doing well with regard to his wound. He does have a little bit of slough noted on the surface of the wound but fortunately there does not appear to be any signs of active infection at this time. No fevers, chills, nausea, vomiting, or diarrhea.  03/18/2021 upon evaluation today patient appears to be doing well with regard to his wound. He has been tolerating the dressing changes without complication. There was a little irritation more proximal to where the wound was that was not noted last week but nonetheless this is very superficial just seems to be more irritation we just need to make sure to put a good amount of the zinc over the area in my opinion. Otherwise he does not seem to be doing significantly worse at all which is great news. 03/25/2021 upon evaluation today patient appears to be doing well with regard to his wound. He is going require some sharp debridement today to clear with some of the necrotic debris. I did perform this today without complication postdebridement wound bed appears to be doing much better this is great news. 04/08/2021 upon evaluation today patient appears to be doing decently well in regard to his wound although the  overall measurement is not significantly smaller compared to previous. It is gone down a little bit but still the facility continues to not really put the appropriate dressings in place in fact he was supposed to have collagen we think he probably had more of an allergy to At this point. Fortunately there does not appear to be any signs of active infection systemically though locally I do not see anything on initial visualization either as far as erythema or warmth. 04/15/2021 upon evaluation today patient appears to be doing well with regard to his wound. He is actually showing signs of improvement. I did place him on antibiotics last week, Cipro. He has been taking that 2 times a day and seems to be tolerating it very well. I do not see any evidence of worsening and in fact the overall appearance of the wound is smaller today which is also great news. 9/26; left medial ankle chronic venous insufficiency wound is improved. Using Hydrofera Blue 10/10; left medial ankle chronic venous insufficiency. Wound has not changed much in appearance completely nonviable surface. Apparently there have been problems getting the right product on the wound at the facility although he came in with [facility] on today 05/14/2021 upon evaluation today patient appears to be doing well with regard to his wound. I think he is making progress here which is good news. Fortunately there does not appear to be any signs of active infection at this time. No fevers, chills, nausea, vomiting, or diarrhea. 05/20/2021 upon evaluation today patient appears to be doing well with regard to his wound. He is showing signs of good improvement which is great news. There does not appear to be any evidence of active infection which is also excellent news. No fevers, chills, nausea, vomiting, or diarrhea. Chad [name], Chad [name] ([id number]) 05/28/2021 upon evaluation today patient appears to be doing quite well. There does not appear to be any  signs of active infection at this time which is great news. Overall I am extremely pleased with where things stand today. I think he is headed in the right direction. 06/11/2021 upon evaluation today patient appears to be doing well with regard to his left ankle ulcer and poorly in regard to the toe ulcer on the second toe right foot. This appears to show signs of joint exposure. Apparently this has been present for 1 to 2 months although he kept forgetting to tell me about it. That is unfortunate as right now it definitely appears to be doing significantly worse than what I would like to see. There does not appear  to be any signs of active infection systemically though locally I am concerned about the possibility of infection the toe is quite red. Again no one from the facility ever contacted [place] to advise that this was going on in the interim either. 06/17/2021 upon evaluation today patient presents for follow-up I did review his x-ray which showed a navicular bone fracture I am unsure of the chronicity of this. Subsequently he also had osteomyelitis of the toe which was what I was more concerned about this did not show up on x-ray but did show up on the pathology scrapings. This was listed as acute osteomyelitis. Nonetheless at this point I think that the antibiotic treatment is the best regimen to go with currently. The patient is in agreement with that plan. Nonetheless he has initially 30 days of doxycycline off likely extend that towards the end of the treatment cycle that will be around the middle of December for an additional 2 weeks. That all depends on how well he continues to heal. Nonetheless based on what I am seeing in the foot I did want a proceed with an MRI as well which I think will be helpful to identify if there is anything else that needs to be addressed from the standpoint of infection. 06/24/2021 upon evaluation today patient appears to be doing pretty well in regards to his  wounds. I think both are actually showing signs of improvement which is good I did review his MRI today which did show signs of osteomyelitis of the middle and proximal phalanx on his right foot of the affected toe. With that being said this is actually showing signs of significant improvement today already with the antibiotic therapy I think the redness is also improved. Overall I think that we just need to give this some time with appropriate wound care we will see how things go potentially hyperbarics could be considered. 07/02/2021 upon inspection today patient actually appears to be doing well in regard to his left ankle which is getting very close to complete resolution of pleased in that regard. Unfortunately he is continuing to have issues with his second toe right foot and this seems to still be very painful for him. Recommend he try something different from the standpoint of antibiotics. 07/15/2021 upon evaluation today patient appears to be doing actually pretty well in regard to his foot. This is actually showing signs of significant improvement which is great news. Overall I feel like the patient is improving both in regard to the second toe as well as the ankle on the left. With that being said the biggest issue that I do see currently is that he is needing to have a refill of the doxycycline that we previously treated him with. He also did see podiatry they are not going to recommend any amputation at this point since he seems to be doing quite well. For that reason we just need to keep things under control from an infection standpoint. 08/01/2021 upon evaluation today patient appears to be doing well with regard to his wound. He has been tolerating the dressing changes without complication. Fortunately there does not appear to be any evidence of active infection locally nor systemically at this point. In fact I think everything is doing excellent in fact his second toe on the right foot  is almost healed and the ankle on the left ankle region is actually very close to being healed as well. 08/08/2021 upon evaluation today patient appears to be doing well with  regard to his wound. He has been tolerating the dressing changes without complication. Fortunately I do not see any signs of active infection at this time. Readmission: 12-06-2021 upon evaluation today patient presents for reevaluation here in the clinic he does tell me that he was being seen in facility at [place] by a provider that was coming in. He is not sure who this was. He tells me however that the wound seems to have gotten worse even compared to where it was when we last saw him at this point. With that being said I do believe that he is likely going need ongoing wound care here in the clinic and I do believe that we need to be the ones to frontline this since his wound does seem to be getting worse not better at this point. He voiced understanding. He is also in agreement with this plan and feels more comfortable coming here she tells me. Patient's medical history really has not changed since his prior admission he was only gone since January. 12-27-2021 upon evaluation today patient appears to be doing well with regard to his wound they did run out of the [facility] so they did not put anything on just an ABD pad with gentamicin. Still we are seeing some signs of good improvement here with some new epithelization which is great news. 01-10-2022 upon evaluation today patient appears to be doing well with regard to his wounds and he is going require some sharp debridement but overall seems to be making good progress. Fortunately I do not see any evidence of active infection locally or systemically at this time which is great news. 01-24-2022 upon evaluation today patient appears to be doing well with regard to his wound. The facility actually came and dropped him off early and he had another appointment at  the hospital and then they just brought him over here and this was still hours before his appointment this afternoon. For that reason we did do our best to work him in this morning and fortunately had some space to make this happen. With that being said patient's wound does seem to be making progress here and I am very pleased in that regard I do not see any signs of active infection locally or systemically at this time. 02-07-2022 patient appears to be doing well currently in regard to his wounds. In fact one of them the more proximal is healed the distal is still open but seems to be doing excellent. Fortunately I do not see any evidence of active infection locally or systemically at this time which is great news. No fevers, chills, nausea, vomiting, or diarrhea. 7/27; left medial ankle venous. Improving per our intake nurse. We are using Hydrofera Blue under Tubigrip compression. They are changing that at his facility 03-06-2022 upon evaluation today patient appears to be doing well with regard to the his wound he is can require some sharp debridement but seems to be making excellent progress. Fortunately I do not see any evidence of active infection locally or systemically which is great news. 03-27-2022 upon evaluation today patient's wound is actually showing signs of excellent improvement. Fortunately I see no signs of active infection locally or systemically at this time which is great news. No fevers, chills, nausea, vomiting, or diarrhea. 04-21-2022 upon evaluation today patient appears to be doing excellent in regard to his wound in fact this is very close to resolution based on what I am seeing. I do not see any evidence of  active infection locally or systemically at this time which is great news and overall I am extremely Chad [name], Chad [name] ([id number]) pleased with where we are today. 05-05-2022 upon evaluation today patient's wound actually appears to potentially be completely healed.  Fortunately I do not see any evidence of active infection at this time which is great news and overall I am very pleased I think this needs a little time to toughen up but other than that I really do believe were doing quite well. He is very pleased to hear this its been a long time coming. Objective Constitutional Well-nourished and well-hydrated in no acute distress. Vitals Time Taken: 10:34 AM, Height: 67 in, Weight: 150 lbs, BMI: 23.5, Temperature: 98.5 F, Pulse: 96 bpm, Respiratory Rate: 18 breaths/min, Blood Pressure: 121/73 mmHg. Respiratory normal breathing without difficulty. Psychiatric this patient is able to make decisions and demonstrates good insight into disease process. Alert and Oriented x 3. pleasant and cooperative. General Notes: Patient's wound bed did require really no sharp debridement at all today and in general I feel like he is doing quite well. I do not see any evidence of infection and overall I think we are on the right track. I believe that we will give it 2 more weeks just to make sure this maintains closure and if he still doing well at that time we will discharge him. Integumentary (Hair, Skin) Wound #12 status is Open. Original cause of wound was Gradually Appeared. The date acquired was: 07/12/2019. The wound has been in treatment 21 weeks. The wound is located on the Left,Distal,Medial Ankle. The wound measures 0.2cm length x 0.2cm width x 0.1cm depth; 0.031cm^2 area and 0.003cm^3 volume. There is Fat Layer (Subcutaneous Tissue) exposed. There is no tunneling or undermining noted. There is a medium amount of serosanguineous drainage noted. The wound margin is distinct with the outline attached to the wound base. There is large (67-100%) pink granulation within the wound bed. There is a small (1-33%) amount of necrotic tissue within the wound bed including Adherent Slough. 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 Bathing/ Shower/ Hygiene: Wash wounds with antibacterial soap and water. No tub bath. Anesthetic (Use 'Patient Medications' Section for Anesthetic Order Entry): Lidocaine applied to wound bed WOUND #12: - Ankle Wound Laterality: Left, Medial, Distal Cleanser: Soap and Water 3 x Per Week/30 Days Discharge Instructions: Gently cleanse wound with antibacterial soap, rinse and pat dry prior to dressing wounds Cleanser: Wound Cleanser 3 x Per Week/30 Days Discharge Instructions: Wash your hands with soap and water. Remove old dressing, discard into plastic bag and place into trash. Cleanse the wound with Wound Cleanser prior to applying a clean dressing using gauze sponges, not tissues or cotton balls. Do not scrub or use excessive force. Pat dry using gauze sponges, not tissue or cotton balls. Chad [name], Chad [name] ([id number]) Peri-Wound Care: Desitin Maximum Strength Ointment 4 (oz) 3 x Per Week/30 Days Discharge Instructions: Apply around wound edges to avoid maceration Topical: Gentamicin 3 x Per Week/30 Days Discharge Instructions: Apply as directed by provider. Primary Dressing: Hydrofera Blue Ready Transfer Foam, 2.5x2.5 (in/in) 3 x Per Week/30 Days Discharge Instructions: Apply Hydrofera Blue Ready to wound bed as directed Secondary Dressing: ABD Pad 5x9 (in/in) 3 x Per Week/30 Days Discharge Instructions: Cover with ABD  pad Secured With: Medipore Tape - 31M Medipore H Soft Cloth Surgical Tape, 2x2 (in/yd) 3 x Per Week/30 Days Secured With: [employer] Sterile or Non-Sterile 6-ply 4.5x4 (yd/yd) 3 x Per Week/30 Days Discharge Instructions: Apply Kerlix as directed Secured With: Tubigrip Size C, 2.75x10 (in/yd) 3 x Per Week/30  Days Discharge Instructions: Apply 3 Tubigrip C 3-finger-widths below knee to base of toes to secure dressing and/or for swelling. This can be hand washed and air dried 1. I would recommend currently that we have the patient continue with the wound care measures as before this includes utilization of the [facility] dressing followed by ABD pad and roll gauze which has done well. I think that it is best just to keep this on place to make sure that this has a chance to toughen up. 2. I am also can recommend that we have the patient continue to monitor for any signs of infection if anything changes he knows he can contact the office and let me know. We will see patient back for reevaluation in 2 weeks here in the clinic. If anything worsens or changes patient will contact our office for additional recommendations. Electronic Signature(s) Signed: 05/06/2022 5:56:10 PM By: Worthy Keeler PA-C Entered By: Worthy Keeler on 05/06/2022 17:56:10 Chad [name] ([id number]) -------------------------------------------------------------------------------- SuperBill Details Patient Name: Chad [name] Date of Service: 05/05/2022 Medical Record Number: [id number] Patient Account Number: [id number] Date of Birth/Sex: 10-13-1960 (61 y.o. M) Treating RN: Carlene Coria Primary Care Provider: [name] Other Clinician: Referring Provider: [name] Treating Provider/Extender: Skipper Cliche in Treatment: 21 Diagnosis Coding ICD-10 Codes Code Description [phone] Chronic venous hypertension (idiopathic) with ulcer and inflammation of left lower extremity L97.322 Non-pressure chronic ulcer of left ankle with fat layer exposed 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 side Facility Procedures CPT4 Code: [id number] Description: [phone] - WOUND CARE VISIT-LEV 2 EST PT Modifier: Quantity: 1 Physician Procedures CPT4 Code Description: [id number]  [id number] - WC PHYS LEVEL 3 - EST PT Modifier: Quantity: 1 CPT4 Code Description: ICD-10 Diagnosis Description 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 (idiopathic) with ulcer and inflammation of L97.322 Non-pressure chronic ulcer of left ankle with fat layer exposed I69.354 Hemiplegia and hemiparesis following  cerebral infarction affecting left Modifier: left lower extremity non-dominant side Quantity: Electronic Signature(s) Signed: 05/06/2022 5:58:05 PM By: Worthy Keeler PA-C Entered By: Worthy Keeler on 05/06/2022 17:58:05

## 2022-05-09 NOTE — Progress Notes (Signed)
DOREAN, DANIELLO ([id number]) Visit Report for 05/05/2022 Arrival Information Details Patient Name: Chad [name], Chad [name] Date of Service: 05/05/2022 10:15 AM Medical Record Number: [id number] Patient Account Number: [id number] Date of Birth/Sex: 1961/05/10 (61 y.o. M) Treating RN: Carlene Coria Primary Care Jordin Dambrosio: [name] Other Clinician: Referring Martyna Thorns: [name] Treating Keshon Markovitz/Extender: Skipper Cliche in Treatment: 21 Visit Information History Since Last Visit All ordered tests and consults were completed: No Patient Arrived: Wheel Chair Added or deleted any medications: No Arrival Time: 10:33 Any new allergies or adverse reactions: No Accompanied By: self Had a fall or experienced change in No Transfer Assistance: None activities of daily living that may affect Patient Identification Verified: Yes risk of falls: Secondary Verification Process Completed: Yes Signs or symptoms of abuse/neglect since last visito No Patient Requires Transmission-Based No Hospitalized since last visit: No Precautions: Implantable device outside of the clinic excluding No Patient Has Alerts: Yes cellular tissue based products placed in the center Patient Alerts: Patient on Blood Thinner since last visit: NOT diabetic Has Dressing in Place as Prescribed: Yes aspirin 46m Pain Present Now: No [facility] Electronic Signature(s) Signed: 05/09/2022 12:25:01 PM By: ECarlene CoriaRN Entered By: ECarlene Coriaon 05/05/2022 10:33:56 Chad [name]([id number] -------------------------------------------------------------------------------- Clinic Level of Care Assessment Details Patient Name: Chad SabalDate of Service: 05/05/2022 10:15 AM Medical Record Number: [id number] Account Number: [id number] of Birth/Sex: 311-06-1961(61 y.o. M) Treating RN: ECarlene CoriaPrimary Care Tram Wrenn: LErik ObeyOther Clinician: Referring Tametra Ahart: LErik ObeyTreating Aanika Defoor/Extender: SSkipper Clichein Treatment: 21 Clinic Level of Care Assessment Items TOOL 4 Quantity Score X - Use when only an EandM is performed on FOLLOW-UP visit 1 0 ASSESSMENTS - Nursing Assessment / Reassessment '[]'  - Reassessment of Co-morbidities (includes updates in patient status) 0 '[]'  - 0 Reassessment of Adherence to Treatment Plan ASSESSMENTS - Wound and Skin Assessment / Reassessment X - Simple Wound Assessment / Reassessment - one wound 1 5 '[]'  - 0 Complex Wound Assessment / Reassessment - multiple wounds '[]'  - 0 Dermatologic / Skin Assessment (not related to wound area) ASSESSMENTS - Focused Assessment '[]'  - Circumferential Edema Measurements - multi extremities 0 '[]'  - 0 Nutritional Assessment / Counseling / Intervention '[]'  - 0 Lower Extremity Assessment (monofilament, tuning fork, pulses) '[]'  - 0 Peripheral Arterial Disease Assessment (using hand held doppler) ASSESSMENTS - Ostomy and/or Continence Assessment and Care '[]'  - Incontinence Assessment and Management 0 '[]'  - 0 Ostomy Care Assessment and Management (repouching, etc.) PROCESS - Coordination of Care X - Simple Patient / Family Education for ongoing care 1 15 '[]'  - 0 Complex (extensive) Patient / Family Education for ongoing care '[]'  - 0 Staff obtains CProgrammer, systems Records, Test Results / Process Orders '[]'  - 0 Staff telephones HHA, Nursing Homes / Clarify orders / etc '[]'  - 0 Routine Transfer to another Facility (non-emergent condition) '[]'  - 0 Routine Hospital Admission (non-emergent condition) '[]'  - 0 New Admissions / IBiomedical engineer/ Ordering NPWT, Apligraf, etc. '[]'  - 0 Emergency Hospital Admission (emergent condition) X- 1 10 Simple Discharge Coordination '[]'  - 0 Complex (extensive) Discharge Coordination PROCESS - Special Needs '[]'  - Pediatric / Minor Patient Management 0 '[]'  - 0 Isolation Patient Management '[]'  - 0 Hearing / Language / Visual special needs '[]'  -  0 Assessment of Community assistance (transportation, D/C planning, etc.) '[]'  - 0 Additional assistance / Altered mentation '[]'  - 0 Support Surface(s) Assessment (bed, cushion, seat, etc.) INTERVENTIONS - Wound Cleansing / Measurement Chad [name],  Chad [name] ([id number]) X- 1 5 Simple Wound Cleansing - one wound '[]'  - 0 Complex Wound Cleansing - multiple wounds X- 1 5 Wound Imaging (photographs - any number of wounds) '[]'  - 0 Wound Tracing (instead of photographs) X- 1 5 Simple Wound Measurement - one wound '[]'  - 0 Complex Wound Measurement - multiple wounds INTERVENTIONS - Wound Dressings X - Small Wound Dressing one or multiple wounds 1 10 '[]'  - 0 Medium Wound Dressing one or multiple wounds '[]'  - 0 Large Wound Dressing one or multiple wounds '[]'  - 0 Application of Medications - topical '[]'  - 0 Application of Medications - injection INTERVENTIONS - Miscellaneous '[]'  - External ear exam 0 '[]'  - 0 Specimen Collection (cultures, biopsies, blood, body fluids, etc.) '[]'  - 0 Specimen(s) / Culture(s) sent or taken to Lab for analysis '[]'  - 0 Patient Transfer (multiple staff / Civil Service fast streamer / Similar devices) '[]'  - 0 Simple Staple / Suture removal (25 or less) '[]'  - 0 Complex Staple / Suture removal (26 or more) '[]'  - 0 Hypo / Hyperglycemic Management (close monitor of Blood Glucose) '[]'  - 0 Ankle / Brachial Index (ABI) - do not check if billed separately X- 1 5 Vital Signs Has the patient been seen at the hospital within the last three years: Yes Total Score: 60 Level Of Care: New/Established - Level 2 Electronic Signature(s) Signed: 05/09/2022 12:25:01 PM By: Carlene Coria RN Entered By: Carlene Coria on 05/05/2022 11:03:06 Chad [name] ([id number]) -------------------------------------------------------------------------------- Encounter Discharge Information Details Patient Name: Chad [name] Date of Service: 05/05/2022 10:15 AM Medical Record Number: [id number] Patient Account  Number: [id number] Date of Birth/Sex: 1960-10-21 (61 y.o. M) Treating RN: Carlene Coria Primary Care Shakeela Rabadan: [name] Other Clinician: Referring Justyn Boyson: [name] Treating Rickia Freeburg/Extender: Skipper Cliche in Treatment: 21 Encounter Discharge Information Items Discharge Condition: Stable Ambulatory Status: Wheelchair Discharge Destination: Home Transportation: Private Auto Accompanied By: self Schedule Follow-up Appointment: Yes Clinical Summary of Care: Electronic Signature(s) Signed: 05/09/2022 12:25:01 PM By: Carlene Coria RN Entered By: Carlene Coria on 05/05/2022 11:03:55 Chad [name] ([id number]) -------------------------------------------------------------------------------- Lower Extremity Assessment Details Patient Name: Chad [name] Date of Service: 05/05/2022 10:15 AM Medical Record Number: [id number] Patient Account Number: [id number] Date of Birth/Sex: [birth year] (61 y.o. M) Treating RN: Carlene Coria Primary Care Arnette Driggs: [name] Other Clinician: Referring Pariss Hommes: [name] Treating Ion Gonnella/Extender: Skipper Cliche in Treatment: 21 Vascular Assessment Pulses: Dorsalis Pedis Palpable: [Left:Yes] Electronic Signature(s) Signed: 05/09/2022 12:25:01 PM By: Carlene Coria RN Entered By: Carlene Coria on 05/05/2022 10:38:02 Chad [name] ([id number]) -------------------------------------------------------------------------------- Multi Wound Chart Details Patient Name: Chad [name] Date of Service: 05/05/2022 10:15 AM Medical Record Number: [id number] Patient Account Number: [id number] Date of Birth/Sex: 1960-09-01 (61 y.o. M) Treating RN: Carlene Coria Primary Care Chelsei Mcchesney: [name] Other Clinician: Referring Vernette Moise: [name] Treating Shirin Echeverry/Extender: Skipper Cliche in Treatment: 21 Vital Signs Height(in): 100 Pulse(bpm): 30 Weight(lbs): 150 Blood Pressure(mmHg): 121/73 Body Mass Index(BMI):  23.5 Temperature(F): 98.5 Respiratory Rate(breaths/min): 18 Photos: [N/A:N/A] Wound Location: Left, Distal, Medial Ankle N/A N/A Wounding Event: Gradually Appeared N/A N/A Primary Etiology: Venous Leg Ulcer N/A N/A Comorbid History: Anemia, Chronic Obstructive N/A N/A Pulmonary Disease (COPD), Coronary Artery Disease, Peripheral Arterial Disease, Peripheral Venous Disease, Hepatitis C, Osteoarthritis, Neuropathy, Received Chemotherapy Date Acquired: 07/12/2019 N/A N/A Weeks of Treatment: 21 N/A N/A Wound Status: Open N/A N/A Wound Recurrence: No N/A N/A Measurements L x W x D (cm) 0.2x0.2x0.1 N/A N/A Area (cm) : 0.031 N/A N/A Volume (cm) : 0.003 N/A N/A % Reduction  in Area: 99.60% N/A N/A % Reduction in Volume: 99.80% N/A N/A Classification: Full Thickness Without Exposed N/A N/A Support Structures Exudate Amount: Medium N/A N/A Exudate Type: Serosanguineous N/A N/A Exudate Color: red, brown N/A N/A Wound Margin: Distinct, outline attached N/A N/A Granulation Amount: Large (67-100%) N/A N/A Granulation Quality: Pink N/A N/A Necrotic Amount: Small (1-33%) N/A N/A Exposed Structures: Fat Layer (Subcutaneous Tissue): N/A N/A Yes Fascia: No Tendon: No Muscle: No Joint: No Bone: No Epithelialization: Medium (34-66%) N/A N/A Treatment Notes Electronic Signature(s) Chad [name], Chad [name] ([id number]) Signed: 05/09/2022 12:25:01 PM By: Carlene Coria RN Entered By: Carlene Coria on 05/05/2022 11:01:20 Chad [name] ([id number]) -------------------------------------------------------------------------------- Multi-Disciplinary Care Plan Details Patient Name: Chad [name] Date of Service: 05/05/2022 10:15 AM Medical Record Number: [id number] Patient Account Number: [id number] Date of Birth/Sex: [birth year] (61 y.o. M) Treating RN: Carlene Coria Primary Care Marcial Pless: [name] Other Clinician: Referring Alvira Hecht: [name] Treating Laberta Wilbon/Extender: Skipper Cliche  in Treatment: 21 Active Inactive Venous Leg Ulcer Nursing Diagnoses: Knowledge deficit related to disease process and management Goals: Patient will maintain optimal edema control Date Initiated: 12/06/2021 Date Inactivated: 12/27/2021 Target Resolution Date: 01/03/2022 Goal Status: Met Patient/caregiver will verbalize understanding of disease process and disease management Date Initiated: 12/06/2021 Target Resolution Date: 05/29/2022 Goal Status: Active Interventions: Assess peripheral edema status every visit. Compression as ordered Notes: Electronic Signature(s) Signed: 05/09/2022 12:25:01 PM By: Carlene Coria RN Entered By: Carlene Coria on 05/05/2022 10:38:35 Chad [name] ([id number]) -------------------------------------------------------------------------------- Pain Assessment Details Patient Name: Chad [name] Date of Service: 05/05/2022 10:15 AM Medical Record Number: [id number] Patient Account Number: [id number] Date of Birth/Sex: [birth year] (61 y.o. M) Treating RN: Carlene Coria Primary Care Rayel Santizo: [name] Other Clinician: Referring Rehmat Murtagh: [name] Treating Kyson Kupper/Extender: Skipper Cliche in Treatment: 21 Active Problems Location of Pain Severity and Description of Pain Patient Has Paino No Site Locations Pain Management and Medication Current Pain Management: Electronic Signature(s) Signed: 05/09/2022 12:25:01 PM By: Carlene Coria RN Entered By: Carlene Coria on 05/05/2022 10:35:05 Chad [name] ([id number]) -------------------------------------------------------------------------------- Patient/Caregiver Education Details Patient Name: Chad [name] Date of Service: 05/05/2022 10:15 AM Medical Record Number: [id number] Patient Account Number: [id number] Date of Birth/Gender: September 03, 1960 (61 y.o. M) Treating RN: Carlene Coria Primary Care Physician: [name] Other Clinician: Referring Physician: [name] Treating  Physician/Extender: Skipper Cliche in Treatment: 21 Education Assessment Education Provided To: Patient Education Topics Provided Wound/Skin Impairment: Methods: Explain/Verbal Responses: State content correctly Electronic Signature(s) Signed: 05/09/2022 12:25:01 PM By: Carlene Coria RN Entered By: Carlene Coria on 05/05/2022 11:03:22 Chad [name] ([id number]) -------------------------------------------------------------------------------- Wound Assessment Details Patient Name: Chad [name] Date of Service: 05/05/2022 10:15 AM Medical Record Number: [id number] Patient Account Number: [id number] Date of Birth/Sex: 04/23/1961 (61 y.o. M) Treating RN: Carlene Coria Primary Care Bladen Umar: [name] Other Clinician: Referring Raevon Broom: [name] Treating Alistar Mcenery/Extender: Skipper Cliche in Treatment: 21 Wound Status Wound Number: 12 Primary Venous Leg Ulcer Etiology: Wound Location: Left, Distal, Medial Ankle Wound Open Wounding Event: Gradually Appeared Status: Date Acquired: 07/12/2019 Comorbid Anemia, Chronic Obstructive Pulmonary Disease (COPD), Weeks Of Treatment: 21 History: Coronary Artery Disease, Peripheral Arterial Disease, Clustered Wound: No Peripheral Venous Disease, Hepatitis C, Osteoarthritis, Neuropathy, Received Chemotherapy Photos Wound Measurements Length: (cm) 0.2 Width: (cm) 0.2 Depth: (cm) 0.1 Area: (cm) 0.031 Volume: (cm) 0.003 % Reduction in Area: 99.6% % Reduction in Volume: 99.8% Epithelialization: Medium (34-66%) Tunneling: No Undermining: No Wound Description Classification: Full Thickness Without Exposed Support Structu Wound Margin: Distinct, outline attached Exudate Amount: Medium Exudate Type: Serosanguineous Exudate Color:  red, brown res Foul Odor After Cleansing: No Slough/Fibrino Yes Wound Bed Granulation Amount: Large (67-100%) Exposed Structure Granulation Quality: Pink Fascia Exposed: No Necrotic Amount:  Small (1-33%) Fat Layer (Subcutaneous Tissue) Exposed: Yes Necrotic Quality: Adherent Slough Tendon Exposed: No Muscle Exposed: No Joint Exposed: No Bone Exposed: No Treatment Notes Wound #12 (Ankle) Wound Laterality: Left, Medial, Distal Cleanser Soap and Water Discharge Instruction: Gently cleanse wound with antibacterial soap, rinse and pat dry prior to dressing wounds Chad [name], Chad [name] ([id number]) Wound Cleanser Discharge Instruction: Wash your hands with soap and water. Remove old dressing, discard into plastic bag and place into trash. Cleanse the wound with Wound Cleanser prior to applying a clean dressing using gauze sponges, not tissues or cotton balls. Do not scrub or use excessive force. Pat dry using gauze sponges, not tissue or cotton balls. Peri-Wound Care Desitin Maximum Strength Ointment 4 (oz) Discharge Instruction: Apply around wound edges to avoid maceration Topical Gentamicin Discharge Instruction: Apply as directed by Maxey Ransom. Primary Dressing Hydrofera Blue Ready Transfer Foam, 2.5x2.5 (in/in) Discharge Instruction: Apply Hydrofera Blue Ready to wound bed as directed Secondary Dressing ABD Pad 5x9 (in/in) Discharge Instruction: Cover with ABD pad Secured With Medipore Tape - 44M Medipore H Soft Cloth Surgical Tape, 2x2 (in/yd) Kerlix Roll Sterile or Non-Sterile 6-ply 4.5x4 (yd/yd) Discharge Instruction: Apply Kerlix as directed Tubigrip Size C, 2.75x10 (in/yd) Discharge Instruction: Apply 3 Tubigrip C 3-finger-widths below knee to base of toes to secure dressing and/or for swelling. This can be hand washed and air dried Compression Wrap Compression Stockings Add-Ons Electronic Signature(s) Signed: 05/09/2022 12:25:01 PM By: Carlene Coria RN Entered By: Carlene Coria on 05/05/2022 10:37:45 Chad [name] ([id number]) -------------------------------------------------------------------------------- Vitals Details Patient Name: Chad [name] Date of  Service: 05/05/2022 10:15 AM Medical Record Number: [id number] Patient Account Number: [id number] Date of Birth/Sex: 07-29-1960 (61 y.o. M) Treating RN: Carlene Coria Primary Care Kenetra Hildenbrand: [name] Other Clinician: Referring Abou Sterkel: [name] Treating Khaliyah Northrop/Extender: Skipper Cliche in Treatment: 21 Vital Signs Time Taken: 10:34 Temperature (F): 98.5 Height (in): 67 Pulse (bpm): 96 Weight (lbs): 150 Respiratory Rate (breaths/min): 18 Body Mass Index (BMI): 23.5 Blood Pressure (mmHg): 121/73 Reference Range: 80 - 120 mg / dl Electronic Signature(s) Signed: 05/09/2022 12:25:01 PM By: Carlene Coria RN Entered By: Carlene Coria on 05/05/2022 10:34:57

## 2022-05-19 ENCOUNTER — Encounter: Payer: Medicaid Other | Admitting: Physician Assistant

## 2022-05-19 DIAGNOSIS — L97322 Non-pressure chronic ulcer of left ankle with fat layer exposed: Secondary | ICD-10-CM | POA: Diagnosis not present

## 2022-05-19 NOTE — Progress Notes (Addendum)
Chad [name], Chad [name] ([id number]) [id number] Page 1 of 9 Visit Report for 05/19/2022 Arrival Information Details Patient Name: Date of Service: Chad [name], Chad [name] 05/19/2022 10:30 A M Medical Record Number: [id number] Patient Account Number: [id number] Date of Birth/Sex: Treating RN: [birth year] (61 y.o. [name], [name] Primary Care Kortnie Stovall: [name] Other Clinician: [name] Referring Kirstina Leinweber: Treating Deago Burruss/Extender: [name] in Treatment: 23 Visit Information History Since Last Visit All ordered tests and consults were completed: No Patient Arrived: Wheel Chair Added or deleted any medications: No Arrival Time: 10:34 Any new allergies or adverse reactions: No Transfer Assistance: EasyPivot Patient Lift Had a fall or experienced change in No Patient Requires Transmission-Based No activities of daily living that may affect Precautions: risk of falls: Patient Has Alerts: Yes Hospitalized since last visit: No Patient Alerts: Patient on Blood Thinner Pain Present Now: No NOT diabetic aspirin 59m [facility] Electronic Signature(s) Signed: 05/19/2022 5:38:46 PM By: VMassie KluverEntered By: VMassie Kluveron 05/19/2022 10:34:59 -------------------------------------------------------------------------------- Clinic Level of Care Assessment Details Patient Name: Date of Service: Chad [name], BOOMHOWER10/23/2023 10:30 A M Medical Record Number: [id number] Account Number: [id number] of Birth/Sex: Treating RN: 312-02-1961(61y.o. MVerl BlalockPrimary Care Paxtyn Wisdom: LErik ObeyOther Clinician: VMassie KluverReferring Deontre Allsup: Treating Tauna Macfarlane/Extender: SVickki Muffin Treatment: 23 Clinic Level of Care Assessment Items TOOL 4 Quantity Score _0  - 0 Use when only an EandM is performed on FOLLOW-UP visit ASSESSMENTS - Nursing Assessment / Reassessment X- 1 10 Reassessment  of Co-morbidities (includes updates in patient status) X- 1 5 Reassessment of Adherence to Treatment Plan ASSESSMENTS - Wound and Skin A ssessment / Reassessment X - Simple Wound Assessment / Reassessment - one wound 1 5 Solimine, Mitchelle ([id number] Page 2 of 9 _1  - 0 Complex Wound Assessment / Reassessment - multiple wounds _2  - 0 Dermatologic / Skin Assessment (not related to wound area) ASSESSMENTS - Focused Assessment _3  - 0 Circumferential Edema Measurements - multi extremities _4  - 0 Nutritional Assessment / Counseling / Intervention _5  - 0 Lower Extremity Assessment (monofilament, tuning fork, pulses) _6  - 0 Peripheral Arterial Disease Assessment (using hand held doppler) ASSESSMENTS - Ostomy and/or Continence Assessment and Care _7  - 0 Incontinence Assessment and Management _8  - 0 Ostomy Care Assessment and Management (repouching, etc.) PROCESS - Coordination of Care X - Simple Patient / Family Education for ongoing care 1 15 _9  - 0 Complex (extensive) Patient / Family Education for ongoing care _10  - 0 Staff obtains CProgrammer, systems Records, T Results / Process Orders est _11  - 0 Staff telephones HHA, Nursing Homes / Clarify orders / etc _12  - 0 Routine Transfer to another Facility (non-emergent condition) _13  - 0 Routine Hospital Admission (non-emergent condition) _14  - 0 New Admissions / IBiomedical engineer/ Ordering NPWT Apligraf, etc. , _15  - 0 Emergency Hospital Admission (emergent condition) X- 1 10 Simple Discharge Coordination _16  - 0 Complex (extensive) Discharge Coordination PROCESS - Special Needs _17  - 0 Pediatric / Minor Patient Management _18  - 0 Isolation Patient Management _19  - 0 Hearing / Language / Visual special needs _20  - 0 Assessment of Community assistance (transportation, D/C planning, etc.) _21  - 0 Additional assistance / Altered mentation _22  - 0 Support Surface(s) Assessment (bed, cushion, seat,  etc.) INTERVENTIONS - Wound Cleansing / Measurement X - Simple Wound Cleansing - one wound 1 5 _23  - 0 Complex Wound Cleansing - multiple wounds X- 1 5 Wound Imaging (photographs - any  number of wounds) _0  - 0 Wound Tracing (instead of photographs) X- 1 5 Simple Wound Measurement - one wound _1  - 0 Complex Wound Measurement - multiple wounds INTERVENTIONS - Wound Dressings _2  - 0 Small Wound Dressing one or multiple wounds X- 1 15 Medium Wound Dressing one or multiple wounds _3  - 0 Large Wound Dressing one or multiple wounds <WJXBJYNWGNFAOZHY>_8<\/MVHQIONGEXBMWUXL>_2  - 0 Application of Medications - topical <GMWNUUVOZDGUYQIH>_4<\/VQQVZDGLOVFIEPPI>_9  - 0 Application of Medications - injection INTERVENTIONS - Miscellaneous _6  - 0 External ear exam _7  - 0 Specimen Collection (cultures, biopsies, blood, body fluids, etc.) Chad [name] ([id number]) [id number] Page 3 of 9 _8  - 0 Specimen(s) / Culture(s) sent or taken to Lab for analysis _9  - 0 Patient Transfer (multiple staff / Harrel Lemon Lift / Similar devices) _10  - 0 Simple Staple / Suture removal (25 or less) _11  - 0 Complex Staple / Suture removal (26 or more) _12  - 0 Hypo / Hyperglycemic Management (close monitor of Blood Glucose) _13  - 0 Ankle / Brachial Index (ABI) - do not check if billed separately X- 1 5 Vital Signs Has the patient been seen at the hospital within the last three years: Yes Total Score: 80 Level Of Care: New/Established - Level 3 Electronic Signature(s) Signed: 05/19/2022 5:38:46 PM By: [name] Entered By: [name] on 05/19/2022 11:02:47 -------------------------------------------------------------------------------- Encounter Discharge Information Details Patient Name: Date of Service: Chad [name] 05/19/2022 10:30 A M Medical Record Number: [id number] Patient Account Number: [id number] Date of Birth/Sex: Treating RN: 08-07-1960 (61 y.o. [name] Primary Care Sujata Maines: [name] Other Clinician: [name] Referring  Keesha Pellum: Treating Lot [place]/Extender: [name] in Treatment: 23 Encounter Discharge Information Items Discharge Condition: Stable Ambulatory Status: Wheelchair Discharge Destination: [place] Telephoned: Yes Orders Sent: Yes Transportation: Other Accompanied By: self Schedule Follow-up Appointment: Yes Clinical Summary of Care: Electronic Signature(s) Signed: 05/19/2022 5:38:46 PM By: [name] Entered By: [name] on 05/19/2022 11:11:23 -------------------------------------------------------------------------------- Lower Extremity Assessment Details Patient Name: Date of Service: Chad [name], Chad [name] 05/19/2022 10:30 A NEHEMYAH, FOUSHEE ([id number]) [id number] Page 4 of 9 Medical Record Number: [id number] Patient Account Number: [id number] Date of Birth/Sex: Treating RN: 28-Dec-1960 (61 y.o. [name] Primary Care Brittan Butterbaugh: [name] Other Clinician: [name] Referring Wrenn Willcox: Treating Jaylen Knope/Extender: [name] in Treatment: 23 Electronic Signature(s) Signed: 05/19/2022 5:38:46 PM By: [name] Signed: 05/19/2022 6:00:27 PM By: Gretta Cool BSN, RN, CWS, Kim RN, BSN Entered By: [name] on 05/19/2022 10:43:02 -------------------------------------------------------------------------------- Multi Wound Chart Details Patient Name: Date of Service: Chad [name], Chad [name] 05/19/2022 10:30 A M Medical Record Number: [id number] Patient Account Number: [id number] Date of Birth/Sex: Treating RN: [birth year] (60 y.o. [name], [name] Primary Care Horace Lukas: [name] Other Clinician: [name] Referring Luciana Cammarata: Treating Shneur [name]/Extender: [name] in Treatment: 23 Vital Signs Height(in): 48 Pulse(bpm): 22 Weight(lbs): 150 Blood Pressure(mmHg): 127/79 Body Mass Index(BMI): 23.5 Temperature(F): 98.3 Respiratory Rate(breaths/min):  18 [12:Photos:] [N/A:N/A] Left, Distal, Medial Ankle N/A N/A Wound Location: Gradually Appeared N/A N/A Wounding Event: Venous Leg Ulcer N/A N/A Primary Etiology: Anemia, Chronic Obstructive N/A N/A Comorbid History: Pulmonary Disease (COPD), Coronary Artery Disease, Peripheral Arterial Disease, Peripheral Venous Disease, Hepatitis C, Osteoarthritis, Neuropathy, Received Chemotherapy 07/12/2019 N/A N/A Date Acquired: 58 N/A N/A Weeks of Treatment: Open N/A N/A Wound Status: No N/A N/A Wound Recurrence: 0.5x0.2x0.1 N/A N/A Measurements L x W x D (cm) 0.079 N/A N/A A (cm) : rea 0.008 N/A N/A Volume (cm) : 98.90% N/A N/A %  Reduction in Area: 99.40% N/A N/A % Reduction in Volume: Full Thickness Without Exposed N/A N/A Classification: Support Structures Medium N/A N/A Exudate Amount: Serosanguineous N/A N/A Exudate Type: red, brown N/A N/A Exudate Color: Distinct, outline attached N/A N/A Wound Margin: Large (67-100%) N/A N/A Granulation Amount: Chad [name] ([id number]) [id number] Page 5 of 9 Pink N/A N/A Granulation Quality: Small (1-33%) N/A N/A Necrotic Amount: Fat Layer (Subcutaneous Tissue): Yes N/A N/A Exposed Structures: Fascia: No Tendon: No Muscle: No Joint: No Bone: No Medium (34-66%) N/A N/A Epithelialization: Treatment Notes Electronic Signature(s) Signed: 05/19/2022 5:38:46 PM By: [name] Entered By: [name] on 05/19/2022 10:43:13 -------------------------------------------------------------------------------- [place] Details Patient Name: Date of Service: Chad [name] 05/19/2022 10:30 A M Medical Record Number: [id number] Patient Account Number: [id number] Date of Birth/Sex: Treating RN: [birth year] (61 y.o. [name] Primary Care Kinze Labo: [name] Other Clinician: [name] Referring Tata Timmins: Treating Doriann Zuch/Extender: [name] in Treatment: 23 Active Inactive Venous Leg Ulcer Nursing Diagnoses: Knowledge deficit related to disease process and management Goals: Patient will maintain optimal edema control Date Initiated: 12/06/2021 Date Inactivated: 12/27/2021 Target Resolution Date: 01/03/2022 Goal Status: Met Patient/caregiver will verbalize understanding of disease process and disease management Date Initiated: 12/06/2021 Target Resolution Date: 05/29/2022 Goal Status: Active Interventions: Assess peripheral edema status every visit. Compression as ordered Notes: Electronic Signature(s) Signed: 05/19/2022 5:38:46 PM By: [name] Signed: 05/19/2022 6:00:27 PM By: Gretta Cool, BSN, RN, CWS, Kim RN, BSN Entered By: [name] on 05/19/2022 10:43:06 Chad [name] ([id number]) [id number] Page 6 of 9 -------------------------------------------------------------------------------- Pain Assessment Details Patient Name: Date of Service: Chad [name], Chad [name] 05/19/2022 10:30 A M Medical Record Number: [id number] Patient Account Number: [id number] Date of Birth/Sex: Treating RN: 1961/05/02 (61 y.o. [name] Primary Care Lazariah Savard: [name] Other Clinician: [name] Referring Thierno Hun: Treating Novalyn Lajara/Extender: [name] in Treatment: 23 Active Problems Location of Pain Severity and Description of Pain Patient Has Paino No Site Locations Pain Management and Medication Current Pain Management: Electronic Signature(s) Signed: 05/19/2022 5:38:46 PM By: [name] Signed: 05/19/2022 6:00:27 PM By: Gretta Cool, BSN, RN, CWS, Kim RN, BSN Entered By: [name] on 05/19/2022 10:37:18 -------------------------------------------------------------------------------- Patient/Caregiver Education Details Patient Name: Date of Service: Chad [name] 10/23/2023andnbsp10:30 A M Medical Record Number: [id number] Patient Account Number:  [id number] Date of Birth/Gender: Treating RN: [birth year] (61 y.o. [name] Primary Care Physician: [name] Other Clinician: [name] Referring Physician: Treating Physician/Extender: [name] in Treatment: [address], [place] ([id number]) [id number] Page 7 of 9 Education Assessment Education Provided To: Patient Education Topics Provided Wound/Skin Impairment: Handouts: Other: continue wound care as directed Methods: Explain/Verbal Responses: State content correctly Electronic Signature(s) Signed: 05/19/2022 5:38:46 PM By: [name] Entered By: [name] on 05/19/2022 11:05:14 -------------------------------------------------------------------------------- Wound Assessment Details Patient Name: Date of Service: Chad [name], Chad [name] 05/19/2022 10:30 A M Medical Record Number: [id number] Patient Account Number: [id number] Date of Birth/Sex: Treating RN: 09/16/1960 (61 y.o. [name] Primary Care Evaan Tidwell: [name] Other Clinician: [name] Referring Shonnie Poudrier: Treating Oretha Weismann/Extender: [name] in Treatment: 23 Wound Status Wound Number: 12 Primary Venous Leg Ulcer Etiology: Wound Location: Left, Distal, Medial Ankle Wound Open Wounding Event: Gradually Appeared Status: Date Acquired: 07/12/2019 Comorbid Anemia, Chronic Obstructive Pulmonary Disease (COPD), Coronary Weeks Of Treatment: 23 History: Artery Disease, Peripheral Arterial Disease, Peripheral Venous Clustered Wound: No Disease, Hepatitis C, Osteoarthritis, Neuropathy, Received Chemotherapy Photos Wound Measurements Length: (cm) 0.5 Width: (cm)  0.2 Depth: (cm) 0.1 Area: (cm) 0.079 Volume: (cm) 0.008 % Reduction in Area: 98.9% % Reduction in Volume: 99.4% Epithelialization: Medium (34-66%) Wound Description Classification: Full Thickness Without Exposed Support Structures Wound Margin: Distinct,  outline attached Burtis, Hallis ([id number]) Exudate Amount: Medium Exudate Type: Serosanguineous Exudate Color: red, brown Foul Odor After Cleansing: No Slough/Fibrino Yes [id number] Page 8 of 9 Wound Bed Granulation Amount: Large (67-100%) Exposed Structure Granulation Quality: Pink Fascia Exposed: No Necrotic Amount: Small (1-33%) Fat Layer (Subcutaneous Tissue) Exposed: Yes Necrotic Quality: Adherent Slough Tendon Exposed: No Muscle Exposed: No Joint Exposed: No Bone Exposed: No Treatment Notes Wound #12 (Ankle) Wound Laterality: Left, Medial, Distal Cleanser Soap and Water Discharge Instruction: Gently cleanse wound with antibacterial soap, rinse and pat dry prior to dressing wounds Wound Cleanser Discharge Instruction: Wash your hands with soap and water. Remove old dressing, discard into plastic bag and place into trash. Cleanse the wound with Wound Cleanser prior to applying a clean dressing using gauze sponges, not tissues or cotton balls. Do not scrub or use excessive force. Pat dry using gauze sponges, not tissue or cotton balls. Peri-Wound Care Topical Primary Dressing Hydrofera Blue Ready Transfer Foam, 2.5x2.5 (in/in) Discharge Instruction: Apply Hydrofera Blue Ready to wound bed as directed Secondary Dressing ABD Pad 5x9 (in/in) Discharge Instruction: Cover with ABD pad Secured With Medipore T - 14M Medipore H Soft Cloth Surgical T ape ape, 2x2 (in/yd) Kerlix Roll Sterile or Non-Sterile 6-ply 4.5x4 (yd/yd) Discharge Instruction: Apply Kerlix as directed Tubigrip Size C, 2.75x10 (in/yd) Discharge Instruction: Apply 3 Tubigrip C 3-finger-widths below knee to base of toes to secure dressing and/or for swelling. This can be hand washed and air dried Compression Wrap Compression Stockings Add-Ons Electronic Signature(s) Signed: 05/19/2022 5:38:46 PM By: [name] Signed: 05/19/2022 6:00:27 PM By: Gretta Cool, BSN, RN, CWS, Kim RN,  BSN Entered By: [name] on 05/19/2022 10:42:47 -------------------------------------------------------------------------------- Vitals Details Patient Name: Date of Service: Chad [name] 05/19/2022 10:30 A M Medical Record Number: [id number] Patient Account Number: [id number] Date of Birth/Sex: Treating RN: 1960/11/19 (61 y.o. [name] Primary Care Halla Chopp: [name] Other Clinician: [name] [facility], [name] ([id number]) [id number] Page 9 of 9 Referring Tramar Brueckner: Treating Ulah Olmo/Extender: [name] in Treatment: 23 Vital Signs Time Taken: 10:35 Temperature (F): 98.3 Height (in): 67 Pulse (bpm): 92 Weight (lbs): 150 Respiratory Rate (breaths/min): 18 Body Mass Index (BMI): 23.5 Blood Pressure (mmHg): 127/79 Reference Range: 80 - 120 mg / dl Electronic Signature(s) Signed: 05/19/2022 5:38:46 PM By: [name] Entered By: [name] on 05/19/2022 10:37:14

## 2022-05-19 NOTE — Progress Notes (Signed)
CLAYBORN, MILNES ([id number]) [id number] Page 1 of 2 Visit Report for 05/19/2022 Chief Complaint Document Details Patient Name: Date of Service: Chad [name], Chad [name] 05/19/2022 10:30 A M Medical Record Number: [id number] Patient Account Number: [id number] Date of Birth/Sex: Treating RN: 08-Feb-1961 (61 y.o. [name] Primary Care Provider: [name] Other Clinician: [name] Referring Provider: Treating Provider/Extender: [name] in Treatment: 23 Information Obtained from: Patient Chief Complaint Left ankle ulcers Electronic Signature(s) Signed: 05/19/2022 10:22:25 AM By: Worthy Keeler PA-C Entered By: Worthy Keeler on 05/19/2022 10:22:25 -------------------------------------------------------------------------------- Problem List Details Patient Name: Date of Service: Chad [name], Chad [name] 05/19/2022 10:30 A M Medical Record Number: [id number] Patient Account Number: [id number] Date of Birth/Sex: Treating RN: 29-Oct-1960 (61 y.o. 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

## 2022-05-20 ENCOUNTER — Encounter: Payer: Self-pay | Admitting: Internal Medicine

## 2022-05-20 ENCOUNTER — Inpatient Hospital Stay (HOSPITAL_BASED_OUTPATIENT_CLINIC_OR_DEPARTMENT_OTHER): Payer: Medicaid Other | Admitting: Internal Medicine

## 2022-05-20 ENCOUNTER — Inpatient Hospital Stay: Payer: Medicaid Other

## 2022-05-20 VITALS — BP 118/74 | HR 78 | Temp 96.6°F | Resp 18

## 2022-05-20 DIAGNOSIS — Z7189 Other specified counseling: Secondary | ICD-10-CM | POA: Diagnosis not present

## 2022-05-20 DIAGNOSIS — Z515 Encounter for palliative care: Secondary | ICD-10-CM

## 2022-05-20 DIAGNOSIS — C661 Malignant neoplasm of right ureter: Secondary | ICD-10-CM | POA: Diagnosis not present

## 2022-05-20 DIAGNOSIS — D500 Iron deficiency anemia secondary to blood loss (chronic): Secondary | ICD-10-CM

## 2022-05-20 DIAGNOSIS — Z5112 Encounter for antineoplastic immunotherapy: Secondary | ICD-10-CM | POA: Diagnosis not present

## 2022-05-20 LAB — CBC WITH DIFFERENTIAL/PLATELET
Abs Immature Granulocytes: 0.01 10*3/uL (ref 0.00–0.07)
Basophils Absolute: 0.1 10*3/uL (ref 0.0–0.1)
Basophils Relative: 1 %
Eosinophils Absolute: 0.5 10*3/uL (ref 0.0–0.5)
Eosinophils Relative: 7 %
HCT: 38.7 % — ABNORMAL LOW (ref 39.0–52.0)
Hemoglobin: 11.9 g/dL — ABNORMAL LOW (ref 13.0–17.0)
Immature Granulocytes: 0 %
Lymphocytes Relative: 21 %
Lymphs Abs: 1.3 10*3/uL (ref 0.7–4.0)
MCH: 24.1 pg — ABNORMAL LOW (ref 26.0–34.0)
MCHC: 30.7 g/dL (ref 30.0–36.0)
MCV: 78.5 fL — ABNORMAL LOW (ref 80.0–100.0)
Monocytes Absolute: 0.6 10*3/uL (ref 0.1–1.0)
Monocytes Relative: 9 %
Neutro Abs: 3.7 10*3/uL (ref 1.7–7.7)
Neutrophils Relative %: 62 %
Platelets: 162 10*3/uL (ref 150–400)
RBC: 4.93 MIL/uL (ref 4.22–5.81)
RDW: 16.7 % — ABNORMAL HIGH (ref 11.5–15.5)
WBC: 6.1 10*3/uL (ref 4.0–10.5)
nRBC: 0 % (ref 0.0–0.2)

## 2022-05-20 LAB — COMPREHENSIVE METABOLIC PANEL
ALT: 23 U/L (ref 0–44)
AST: 39 U/L (ref 15–41)
Albumin: 3.3 g/dL — ABNORMAL LOW (ref 3.5–5.0)
Alkaline Phosphatase: 131 U/L — ABNORMAL HIGH (ref 38–126)
Anion gap: 4 — ABNORMAL LOW (ref 5–15)
BUN: 16 mg/dL (ref 8–23)
CO2: 24 mmol/L (ref 22–32)
Calcium: 8 mg/dL — ABNORMAL LOW (ref 8.9–10.3)
Chloride: 109 mmol/L (ref 98–111)
Creatinine, Ser: 1.02 mg/dL (ref 0.61–1.24)
GFR, Estimated: >60 mL/min (ref >60)
Glucose, Bld: 125 mg/dL — ABNORMAL HIGH (ref 70–99)
Potassium: 3.9 mmol/L (ref 3.5–5.1)
Sodium: 137 mmol/L (ref 135–145)
Total Bilirubin: 0.3 mg/dL (ref 0.3–1.2)
Total Protein: 7.3 g/dL (ref 6.5–8.1)

## 2022-05-20 LAB — TSH: TSH: 2.958 u[IU]/mL (ref 0.350–4.500)

## 2022-05-20 MED ORDER — SODIUM CHLORIDE 0.9 % IV SOLN
200.0000 mg | Freq: Once | INTRAVENOUS | Status: AC
  Administered 2022-05-20: 200 mg via INTRAVENOUS
  Filled 2022-05-20: qty 200

## 2022-05-20 MED ORDER — SODIUM CHLORIDE 0.9 % IV SOLN
200.0000 mg | Freq: Once | INTRAVENOUS | Status: AC
  Administered 2022-05-20: 200 mg via INTRAVENOUS
  Filled 2022-05-20: qty 8

## 2022-05-20 MED ORDER — HEPARIN SOD (PORK) LOCK FLUSH 100 UNIT/ML IV SOLN
500.0000 [IU] | Freq: Once | INTRAVENOUS | Status: AC | PRN
  Administered 2022-05-20: 500 [IU]
  Filled 2022-05-20: qty 5

## 2022-05-20 MED ORDER — SODIUM CHLORIDE 0.9 % IV SOLN
Freq: Once | INTRAVENOUS | Status: AC
  Filled 2022-05-20: qty 250

## 2022-05-20 NOTE — Patient Instructions (Signed)
[facility]  Discharge Instructions: Thank you for choosing [facility] to provide your oncology and hematology care.  If you have a lab appointment with the Cancer Center, please go directly to the Cancer Center and check in at the registration area.  Wear comfortable clothing and clothing appropriate for easy access to any Portacath or PICC line.   We strive to give you quality time with your provider. You may need to reschedule your appointment if you arrive late (15 or more minutes).  Arriving late affects you and other patients whose appointments are after yours.  Also, if you miss three or more appointments without notifying the office, you may be dismissed from the clinic at the provider's discretion.      For prescription refill requests, have your pharmacy contact our office and allow 72 hours for refills to be completed.       To help prevent nausea and vomiting after your treatment, we encourage you to take your nausea medication as directed.  BELOW ARE SYMPTOMS THAT SHOULD BE REPORTED IMMEDIATELY: *FEVER GREATER THAN 100.4 F (38 C) OR HIGHER *CHILLS OR SWEATING *NAUSEA AND VOMITING THAT IS NOT CONTROLLED WITH YOUR NAUSEA MEDICATION *UNUSUAL SHORTNESS OF BREATH *UNUSUAL BRUISING OR BLEEDING *URINARY PROBLEMS (pain or burning when urinating, or frequent urination) *BOWEL PROBLEMS (unusual diarrhea, constipation, pain near the anus) TENDERNESS IN MOUTH AND THROAT WITH OR WITHOUT PRESENCE OF ULCERS (sore throat, sores in mouth, or a toothache) UNUSUAL RASH, SWELLING OR PAIN  UNUSUAL VAGINAL DISCHARGE OR ITCHING   Items with * indicate a potential emergency and should be followed up as soon as possible or go to the Emergency Department if any problems should occur.  Please show the CHEMOTHERAPY ALERT CARD or IMMUNOTHERAPY ALERT CARD at check-in to the Emergency Department and triage nurse.  Should you have questions after your  visit or need to cancel or reschedule your appointment, please contact [facility]  [phone] and follow the prompts.  Office hours are 8:00 a.m. to 4:30 p.m. Monday - Friday. Please note that voicemails left after 4:00 p.m. may not be returned until the following business day.  We are closed weekends and major holidays. You have access to a nurse at all times for urgent questions. Please call the main number to the clinic [phone] and follow the prompts.  For any non-urgent questions, you may also contact your provider using MyChart. We now offer e-Visits for anyone 18 and older to request care online for non-urgent symptoms. For details visit [email or web address].   Also download the MyChart app! Go to the app store, search "MyChart", open the app, select [facility], and log in with your MyChart username and password.  Masks are optional in the cancer centers. If you would like for your care team to wear a mask while they are taking care of you, please let them know. For doctor visits, patients may have with them one support person who is at least 63 years old. At this time, visitors are not allowed in the infusion area.   

## 2022-05-20 NOTE — Assessment & Plan Note (Addendum)
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 On keytruda-SEP, 22nd 2023-radiologically mild progressive disease noted in the proximal right ureter; however overall clinically stable. See below-regarding sigmoid cancer-   # Proceed with  [place] today; Labs today reviewed; Labs today reviewed;  acceptable for treatment today. STABLE. TSH AUG 2023--WNL.   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 SEP 22nd, 2023 CT scan- Focal wall thickening in the proximal sigmoid colon; with worsening of pericolonic lymph nodes . ; s/p evaluation with Dr. Bary Castilla- over all clinically STABLE.  # Iron deficiency anemia-hemoglobin 11-12; FEB 2023- iron studies-7/ferritin-18 LOW; s/p  venofer-hemoglobin 12.3.  # Hypocalcemia- Mild- on Ca+vit D; JUNE 2023- 25-OH vit D levels: 30. STABLE.   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 At the same time stage III [at least colon cancer]-prohibitively high risk of complication of surgery.  All and all patient understands his overall poor prognosis.  Continue current therapy for now.  Discuss Code status/palliative care referral.   #DISPOSITION:  #  Keytruda today; Venofer today-   # follow up in 3 weeks-; MD- labs-cbc/cmp; Keytruda; venofer- -Dr.B.

## 2022-05-20 NOTE — Progress Notes (Signed)
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  Is alone/in a wheelchair.  Chad [name] 61 y.o.  male above history of stage IV-ureteral cancer/history of stage III colon cancer right side; recurrent sigmoid colon cancer [un-resected/under surveillance] and multiple other comorbidities currently on Keytruda is here for follow-up   Denies any worsening abdominal pain.  Any nausea vomiting or constipation.  No blood in stools or black-colored stools.  No hematuria.  Chronic shortness of  breath.  Not any worse.  Review of Systems  Constitutional:  Positive for malaise/fatigue. Negative for chills, diaphoresis, fever and weight loss.  HENT:  Negative for nosebleeds and sore throat.   Eyes:  Negative for double vision.  Respiratory:  Positive for cough and shortness of breath. Negative for hemoptysis and wheezing.   Cardiovascular:  Negative for chest pain, palpitations and orthopnea.  Gastrointestinal:  Positive for constipation. Negative for abdominal pain, blood in stool, diarrhea, heartburn, melena, nausea and vomiting.  Genitourinary:  Negative for dysuria, frequency and urgency.  Musculoskeletal:  Positive for back pain and joint pain.  Skin: Negative.  Negative for itching and rash.  Neurological:  Positive for focal weakness. Negative for dizziness, tingling, weakness and headaches.       Chronic left-sided weakness upper than lower extremity.  Endo/Heme/Allergies:  Does not bruise/bleed easily.  Psychiatric/Behavioral:  Negative for depression. The patient is not nervous/anxious and does not have insomnia.      MEDICAL HISTORY:  Past Medical History:  Diagnosis Date   Anemia    Anxiety    ARF (acute respiratory failure) (HCC)    Bladder cancer (HCC)    COPD (chronic obstructive pulmonary disease) ([place])    Depression    Dysphagia    Family history of colon cancer    Family history of kidney cancer    Family history of leukemia    Family history of prostate cancer    GERD (gastroesophageal reflux disease)    Hepatitis    chronic hep c   Hydronephrosis    Hydronephrosis with ureteral stricture    Hyperlipidemia    Knee pain    Left  Malignant neoplasm of colon (HCC)    Nerve pain    Peripheral vascular disease (HCC)    Prostate cancer (HCC)    Stroke (HCC)    Ureteral cancer, right ([place])    Urinary frequency    Venous hypertension of both lower extremities     SURGICAL HISTORY: Past Surgical History:  Procedure Laterality Date   COLON SURGERY      En bloc extended right hemicolectomy 07/2017   COLONOSCOPY WITH PROPOFOL N/A 11/06/2020   Procedure: COLONOSCOPY WITH PROPOFOL;  Surgeon: Jonathon Bellows, MD;  Location: [facility] ENDOSCOPY;  Service: Gastroenterology;  Laterality: N/A;   COLONOSCOPY WITH PROPOFOL N/A 07/31/2021   Procedure: COLONOSCOPY WITH PROPOFOL;  Surgeon: Tkai Bellow, MD;  Location: [facility] ENDOSCOPY;  Service: Endoscopy;  Laterality: N/A;   CYSTOSCOPY W/ RETROGRADES Right 08/30/2018   Procedure: CYSTOSCOPY WITH RETROGRADE PYELOGRAM;  Surgeon: Hollice Espy, MD;  Location: [facility] ORS;  Service: Urology;  Laterality: Right;   CYSTOSCOPY WITH STENT PLACEMENT Right 04/25/2018   Procedure: CYSTOSCOPY WITH STENT PLACEMENT;  Surgeon: Hollice Espy, MD;  Location: [facility] ORS;  Service: Urology;  Laterality: Right;   CYSTOSCOPY WITH STENT PLACEMENT Right 08/30/2018   Procedure: [place];  Surgeon: Hollice Espy, MD;  Location: [facility] ORS;  Service: Urology;  Laterality: Right;   CYSTOSCOPY WITH STENT PLACEMENT Right 03/07/2019   Procedure: CYSTOSCOPY WITH STENT [place];  Surgeon: Hollice Espy, MD;  Location: [facility] ORS;  Service: Urology;  Laterality: Right;   CYSTOSCOPY WITH STENT PLACEMENT Right 11/21/2019   Procedure: CYSTOSCOPY WITH STENT [place];  Surgeon: Hollice Espy, MD;  Location: [facility] ORS;  Service: Urology;  Laterality: Right;   LOWER EXTREMITY ANGIOGRAPHY Left 05/23/2019   Procedure: LOWER EXTREMITY ANGIOGRAPHY;  Surgeon: Algernon Huxley, MD;  Location: [place] [facility];  Service: Cardiovascular;  Laterality: Left;   LOWER EXTREMITY ANGIOGRAPHY Right 05/30/2019   Procedure: LOWER EXTREMITY ANGIOGRAPHY;  Surgeon: Algernon Huxley, MD;  Location: [place] [facility];  Service: Cardiovascular;  Laterality: Right;   LOWER EXTREMITY ANGIOGRAPHY Right 02/13/2020   Procedure: LOWER EXTREMITY ANGIOGRAPHY;  Surgeon: Algernon Huxley, MD;  Location: [place] [facility];  Service: Cardiovascular;  Laterality: Right;    LOWER EXTREMITY ANGIOGRAPHY Left 02/20/2020   Procedure: LOWER EXTREMITY ANGIOGRAPHY;  Surgeon: Algernon Huxley, MD;  Location: [place] [facility];  Service: Cardiovascular;  Laterality: Left;   PORTA CATH INSERTION N/A 02/28/2019   Procedure: PORTA CATH INSERTION;  Surgeon: Algernon Huxley, MD;  Location: [place] [facility];  Service: Cardiovascular;  Laterality: N/A;   tumor removed       SOCIAL HISTORY: Social History   Socioeconomic History   Marital status: Single    Spouse name: Not on file   Number of children: Not on file   Years of education: Not on file   Highest education level: Not on file  Occupational History   Not on file  Tobacco Use   Smoking status: Every Day    Packs/day: 1.00    Types: Cigarettes   Smokeless tobacco: Never  Vaping Use   Vaping Use: Never used  Substance and Sexual Activity   Alcohol use: Not Currently   Drug use: Not Currently   Sexual activity: Not Currently  Other Topics Concern   Not on file  Social History Narrative    used to live [place]; moved  To [place]- end of April 2019; in Nursing home; 1pp/day; quit alcohol. 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  Family- dad- prostate ca [at 56y]; brother- 70 died of prostate cancer; brother- 33- no cancers [[place]]; sonGerald Stabs [[place]];[name]-32y prostate ca [facility] [place]]; daughter- 54 [[place]]; another daughter 34 [[place]/addict]. will refer genetics counseling. Given MSI- abnormal; highly suspicious of Lynch syndrome.  Patient's son [name] aware of high possible lynch syndrome.   Social Determinants of Health   Financial Resource Strain: Not on file  Food Insecurity: Not on file  Transportation Needs: Not on file  Physical Activity: Not on file  Stress: Not on file  Social Connections: Not on file  Intimate Partner Violence: Not on file    FAMILY HISTORY: Family History  Problem Relation Age of Onset   Prostate cancer Father 57   Cancer Brother 51       unsure type   Cancer Paternal Uncle         unsure type   Cancer Maternal Grandmother        unsure type   Cancer Paternal Grandmother        unsure type   Kidney cancer Paternal Grandfather    Cancer Other        unsure types   Leukemia Son    Cancer Son        other cancers, possibly colon    ALLERGIES:  is allergic to penicillins.  MEDICATIONS:  Current Outpatient Medications  Medication Sig Dispense Refill   acetaminophen (TYLENOL) 325 MG tablet Take 650 mg by mouth 3 (three) times daily.      aspirin EC 81 MG tablet Take 1 tablet (81 mg total) by mouth daily. 150 tablet 2   atorvastatin (LIPITOR) 10 MG tablet Take 1 tablet (10 mg total) by mouth daily. 30 tablet 11   Calcium Carbonate Antacid 600 MG chewable tablet Chew 2 tablets (1,200 mg total) by mouth daily. 90 tablet 3   carboxymethylcellulose 1 % ophthalmic solution Apply 1 drop to eye 2 (two) times daily.     cholecalciferol (VITAMIN D3) 25 MCG (1000 UNIT) tablet Take 1 tablet (1,000 Units total) by mouth daily. 1 tablet 3   clopidogrel (PLAVIX) 75 MG tablet Take 1 tablet (75 mg total) by mouth daily. 30 tablet 11   cyclobenzaprine (FLEXERIL) 10 MG tablet Take 10 mg by mouth at bedtime.     escitalopram (LEXAPRO) 5 MG tablet Take 5 mg by mouth daily.     gabapentin (NEURONTIN) 100 MG capsule Take 100 mg by mouth 3 (three) times daily.     gentamicin ointment (GARAMYCIN) 0.1 % Apply 1 application. topically 3 (three) times daily.     levothyroxine (SYNTHROID) 25 MCG tablet Take 1 tablet (25 mcg total) by mouth daily before breakfast. 30 tablet 1   levothyroxine (SYNTHROID) 25 MCG tablet Take 1 tablet (25 mcg total) by mouth daily before breakfast. 30 tablet 1   mirtazapine (REMERON) 7.5 MG tablet Take 7.5 mg by mouth at bedtime.     Oxycodone HCl 10 MG TABS Take 10 mg by mouth every 6 (six) hours as needed for pain.     pantoprazole (PROTONIX) 40 MG tablet Take 40 mg by mouth daily.     senna-docusate (SENOKOT-S) 8.6-50 MG tablet Take 1 tablet by mouth daily.      tamsulosin (FLOMAX) 0.4 MG CAPS capsule Take 1 capsule (0.4 mg total) by mouth daily after supper. 30 capsule 3   No current facility-administered medications for this visit.   Facility-Administered Medications Ordered in Other Visits  Medication Dose Route Frequency Provider Last Rate  Last Admin   0.9 %  sodium chloride infusion   Intravenous Once Charlaine Dalton R, MD       0.9 %  sodium chloride infusion   Intravenous Once Charlaine Dalton R, MD       heparin lock flush 100 unit/mL  500 Units Intracatheter Once PRN Cammie Sickle, MD       iron sucrose (VENOFER) 200 mg in sodium chloride 0.9 % 100 mL IVPB  200 mg Intravenous Once Verlon Au, NP       pembrolizumab (KEYTRUDA) 200 mg in sodium chloride 0.9 % 50 mL chemo infusion  200 mg Intravenous Once Cammie Sickle, MD          .  PHYSICAL EXAMINATION: ECOG PERFORMANCE STATUS: 1 - Symptomatic but completely ambulatory  Vitals:   05/20/22 0925  BP: 118/74  Pulse: 78  Resp: 18  Temp: (!) 96.6 F (35.9 C)  SpO2: 94%   There were no vitals filed for this visit.     Physical Exam HENT:     Head: Normocephalic and atraumatic.     Mouth/Throat:     Pharynx: No oropharyngeal exudate.  Eyes:     Pupils: Pupils are equal, round, and reactive to light.  Cardiovascular:     Rate and Rhythm: Normal rate and regular rhythm.  Pulmonary:     Effort: No respiratory distress.     Breath sounds: No wheezing.  Abdominal:     General: Bowel sounds are normal. There is no distension.     Palpations: Abdomen is soft. There is no mass.     Tenderness: There is no abdominal tenderness. There is no guarding or rebound.  Musculoskeletal:        General: No tenderness. Normal range of motion.     Cervical back: Normal range of motion and neck supple.  Skin:    General: Skin is warm.     Comments: Bilateral lower extremity ulcerations noted.  Pulses intact bilaterally.  Cystlike lesion noted right  elbow-nontender.  No erythema noted.  Neurological:     Mental Status: He is oriented to person, place, and time.     Comments: Sleepy but easily arousable.  Chronic weakness left upper and lower extremity.  Psychiatric:        Mood and Affect: Affect normal.      LABORATORY DATA:  I have reviewed the data as listed Lab Results  Component Value Date   WBC 6.1 05/20/2022   HGB 11.9 (L) 05/20/2022   HCT 38.7 (L) 05/20/2022   MCV 78.5 (L) 05/20/2022   PLT 162 05/20/2022   Recent Labs    04/08/22 0838 04/29/22 0843 05/20/22 0913  NA 137 135 137  K 3.8 3.7 3.9  CL 109 105 109  CO2 _0 GLUCOSE 129* 154* 125*  BUN _1 CREATININE 0.98 1.04 1.02  CALCIUM 8.4* 8.3* 8.0*  GFRNONAA >60 >60 >60  PROT 7.2 7.4 7.3  ALBUMIN 3.4* 3.3* 3.3*  AST 37 31 39  ALT _2 ALKPHOS 110 112 131*  BILITOT 0.6 0.4 0.3    ASSESSMENT & PLAN:   Ureteral cancer, right (HCC) # High-grade urothelial cancer/cytology; likely of the right renal pelvis /upper ureter. On keytruda-SEP, 22nd 2023-radiologically mild progressive disease noted in the proximal right ureter; however overall clinically stable. See below-regarding sigmoid cancer-   # Proceed with  [place] today; Labs today reviewed; Labs today reviewed;  acceptable for treatment  today. STABLE. TSH AUG 2023--WNL.   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 SEP 22nd, 2023 CT scan- Focal wall thickening in the proximal sigmoid colon; with worsening of pericolonic lymph nodes . ; s/p evaluation with Dr. Bary Castilla- over all clinically STABLE.  # Iron deficiency anemia-hemoglobin 11-12; FEB 2023- iron studies-7/ferritin-18 LOW; s/p  venofer-hemoglobin 12.3.  # Hypocalcemia- Mild- on Ca+vit D; JUNE 2023- 25-OH vit D levels: 30. STABLE.   # PROGNOSIS: Again I had a long discussion with the patient regarding overall poor prognosis-patient is  likely stage IV ureteral cancer/in curable [cannot offer nephroureterectomy-given his comorbidities.]. At the same time stage III [at least colon cancer]-prohibitively high risk of complication of surgery.  All and all patient understands his overall poor prognosis.  Continue current therapy for now.  Discuss Code status/palliative care referral.   #DISPOSITION:  #  Keytruda today; Venofer today-   # follow up in 3 weeks-; MD- labs-cbc/cmp; Keytruda; venofer- -Dr.B.        All questions were answered. The patient knows to call the clinic with any problems, questions or concerns.    Cammie Sickle, MD 05/20/2022 10:05 AM

## 2022-05-20 NOTE — Progress Notes (Signed)
Patient has no concerns today. 

## 2022-05-21 LAB — T4: T4, Total: 8.2 ug/dL (ref 4.5–12.0)

## 2022-05-27 ENCOUNTER — Telehealth: Payer: Self-pay | Admitting: *Deleted

## 2022-05-27 NOTE — Telephone Encounter (Signed)
Chad [name], patient son/ POA called asking what was said at last appointment as patient had asked him how long he is going to live with his diagnosed. I read Dr B note plan and assessment to him. He would like to be at the Palliative care appointment with patient, this has not been scheduled Please call patient POA Chad [name] when scheduled so he can attend                                                                                         ASSESSMENT & PLAN:    Ureteral cancer, right ([place]) # High-grade urothelial cancer/cytology; likely of the right renal pelvis /upper ureter. On keytruda-SEP, 22nd 2023-radiologically mild progressive disease noted in the proximal right ureter; however overall clinically stable. See below-regarding sigmoid cancer-    # Proceed with  [place] today; Labs today reviewed; Labs today reviewed;  acceptable for treatment today. STABLE. TSH AUG 2023--WNL.    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 SEP 22nd, 2023 CT scan- Focal wall thickening in the proximal sigmoid colon; with worsening of pericolonic lymph nodes . ; s/p evaluation with Dr. Bary Castilla- over all clinically STABLE.   # Iron deficiency anemia-hemoglobin 11-12; FEB 2023- iron studies-7/ferritin-18 LOW; s/p  venofer-hemoglobin 12.3.   # Hypocalcemia- Mild- on Ca+vit D; JUNE 2023- 25-OH vit D levels: 30. STABLE.    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 At the same time stage III [at least colon cancer]-prohibitively high risk of complication of surgery.  All and all patient understands his overall poor prognosis.  Continue current therapy for now.  Discuss Code status/palliative care referral.    #DISPOSITION:   #  Keytruda today; Venofer today-    # follow  up in 3 weeks-; MD- labs-cbc/cmp; Keytruda; venofer- -Dr.B.   All questions were answered. The patient knows to call the clinic with any problems, questions or concerns.    Cammie Sickle, MD 05/20/2022 10:05 AM

## 2022-06-02 ENCOUNTER — Encounter: Payer: Medicaid Other | Attending: Physician Assistant | Admitting: Internal Medicine

## 2022-06-02 DIAGNOSIS — Z5900 Homelessness unspecified: Secondary | ICD-10-CM | POA: Insufficient documentation

## 2022-06-02 DIAGNOSIS — I69352 Hemiplegia and hemiparesis following cerebral infarction affecting left dominant side: Secondary | ICD-10-CM | POA: Diagnosis not present

## 2022-06-02 DIAGNOSIS — B1920 Unspecified viral hepatitis C without hepatic coma: Secondary | ICD-10-CM | POA: Diagnosis not present

## 2022-06-02 DIAGNOSIS — I872 Venous insufficiency (chronic) (peripheral): Secondary | ICD-10-CM | POA: Insufficient documentation

## 2022-06-02 DIAGNOSIS — L97322 Non-pressure chronic ulcer of left ankle with fat layer exposed: Secondary | ICD-10-CM | POA: Diagnosis not present

## 2022-06-02 DIAGNOSIS — I69354 Hemiplegia and hemiparesis following cerebral infarction affecting left non-dominant side: Secondary | ICD-10-CM | POA: Diagnosis not present

## 2022-06-02 DIAGNOSIS — I87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 hypertension (idiopathic) with ulcer and inflammation of left lower extremity: Secondary | ICD-10-CM | POA: Diagnosis present

## 2022-06-04 NOTE — Progress Notes (Signed)
Chad, [name] ([id number]) [id number] Page 1 of 9 Visit Report for 06/02/2022 Arrival Information Details Patient Name: Date of Service: Chad [name], Chad [name] 06/02/2022 10:30 A M Medical Record Number: [id number] Patient Account Number: [id number] Date of Birth/Sex: Treating RN: 01/15/1961 (61 y.o. Chad [name], Chad [name] Primary Care Cordaryl Decelles: [name] Other Clinician: [name] Referring Rydan Gulyas: Treating Sekou Zuckerman/Extender: Eldridge Dace, MICHA EL Latina Craver in Treatment: 25 Visit Information History Since Last Visit All ordered tests and consults were completed: No Patient Arrived: Wheel Chair Added or deleted any medications: No Arrival Time: 10:53 Any new allergies or adverse reactions: No Transfer Assistance: EasyPivot Patient Lift Had a fall or experienced change in No Patient Requires Transmission-Based No activities of daily living that may affect Precautions: risk of falls: Patient Has Alerts: Yes Hospitalized since last visit: No Patient Alerts: Patient on Blood Thinner Pain Present Now: No NOT diabetic aspirin [facility]m [facility] Signature(s) Signed: 06/03/2022 5:15:48 PM By: VMassie KluverEntered By: VMassie Kluveron 06/02/2022 10:53:22 -------------------------------------------------------------------------------- Clinic Level of Care Assessment Details Patient Name: Date of Service: [name], [name]11/12/2021 10:30 A M Medical Record Number: [id number] Account Number: [id number] of Birth/Sex: Treating RN: [birth year](61y.o. MVerl BlalockPrimary Care Chad [name]: LErik ObeyOther Clinician: VMassie KluverReferring Tecia Cinnamon: Treating Chad [name]/Extender: REldridge Dace MDownsvilleEL GHaze JustinWeeks in Treatment: 25 Clinic Level of Care Assessment Items TOOL 4 Quantity Score _0  - 0 Use when only an EandM is performed on FOLLOW-UP visit ASSESSMENTS - Nursing Assessment / Reassessment X- 1  10 Reassessment of Co-morbidities (includes updates in patient status) X- 1 5 Reassessment of Adherence to Treatment Plan ASSESSMENTS - Wound and Skin A ssessment / Reassessment X - Simple Wound Assessment / Reassessment - one wound 1 5 Bohac, Sisto ([id number] Page 2 of 9 _1  - 0 Complex Wound Assessment / Reassessment - multiple wounds _2  - 0 Dermatologic / Skin Assessment (not related to wound area) ASSESSMENTS - Focused Assessment _3  - 0 Circumferential Edema Measurements - multi extremities _4  - 0 Nutritional Assessment / Counseling / Intervention _5  - 0 Lower Extremity Assessment (monofilament, tuning fork, pulses) _6  - 0 Peripheral Arterial Disease Assessment (using hand held doppler) ASSESSMENTS - Ostomy and/or Continence Assessment and Care _7  - 0 Incontinence Assessment and Management _8  - 0 Ostomy Care Assessment and Management (repouching, etc.) PROCESS - Coordination of Care X - Simple Patient / Family Education for ongoing care 1 15 _9  - 0 Complex (extensive) Patient / Family Education for ongoing care _10  - 0 Staff obtains CProgrammer, systems Records, T Results / Process Orders est _11  - 0 Staff telephones HHA, Nursing Homes / Clarify orders / etc _12  - 0 Routine Transfer to another Facility (non-emergent condition) _13  - 0 Routine Hospital Admission (non-emergent condition) _14  - 0 New Admissions / IBiomedical engineer/ Ordering NPWT Apligraf, etc. , _15  - 0 Emergency Hospital Admission (emergent condition) X- 1 10 Simple Discharge Coordination _16  - 0 Complex (extensive) Discharge Coordination PROCESS - Special Needs _17  - 0 Pediatric / Minor Patient Management _18  - 0 Isolation Patient Management _19  - 0 Hearing / Language / Visual special needs _20  - 0 Assessment of Community assistance (transportation, D/C planning, etc.) _21  - 0 Additional assistance / Altered mentation _22  - 0 Support Surface(s) Assessment (bed,  cushion, seat, etc.) INTERVENTIONS - Wound Cleansing / Measurement X - Simple Wound Cleansing - one wound 1 5 _23  - 0 Complex Wound Cleansing - multiple wounds  X- 1 5 Wound Imaging (photographs - any number of wounds) _0  - 0 Wound Tracing (instead of photographs) X- 1 5 Simple Wound Measurement - one wound _1  - 0 Complex Wound Measurement - multiple wounds INTERVENTIONS - Wound Dressings _2  - 0 Small Wound Dressing one or multiple wounds X- 1 15 Medium Wound Dressing one or multiple wounds _3  - 0 Large Wound Dressing one or multiple wounds X- 1 5 Application of Medications - topical <[id number]>_3<\/[id number]>_4  - 0 Application of Medications - injection INTERVENTIONS - Miscellaneous _5  - 0 External ear exam _6  - 0 Specimen Collection (cultures, biopsies, blood, body fluids, etc.) Chad [name] ([id number]) [id number] Page 3 of 9 _7  - 0 Specimen(s) / Culture(s) sent or taken to Lab for analysis _8  - 0 Patient Transfer (multiple staff / Harrel Lemon Lift / Similar devices) _9  - 0 Simple Staple / Suture removal (25 or less) _10  - 0 Complex Staple / Suture removal (26 or more) _11  - 0 Hypo / Hyperglycemic Management (close monitor of Blood Glucose) _12  - 0 Ankle / Brachial Index (ABI) - do not check if billed separately X- 1 5 Vital Signs Has the patient been seen at the hospital within the last three years: Yes Total Score: 85 Level Of Care: New/Established - Level 3 Electronic Signature(s) Signed: 06/03/2022 5:15:48 PM By: [name] Entered By: [name] on 06/02/2022 11:18:12 -------------------------------------------------------------------------------- Encounter Discharge Information Details Patient Name: Date of Service: [name] 06/02/2022 10:30 A M Medical Record Number: [id number] Patient Account Number: [id number] Date of Birth/Sex: Treating RN: 1960/10/10 (61 y.o. Chad [name] Primary Care Shacola Schussler: [name] Other Clinician: [name] Referring Jamill Wetmore: Treating Oscar Hank/Extender: RO BSO Delane Ginger, MICHA EL Latina Craver in Treatment: 25 Encounter Discharge Information Items Discharge Condition: Stable Ambulatory Status: Wheelchair Discharge Destination: Home Transportation: Other Accompanied By: self Schedule Follow-up Appointment: Yes Clinical Summary of Care: Electronic Signature(s) Signed: 06/03/2022 5:15:48 PM By: [name] Entered By: [name] on 06/02/2022 13:27:56 -------------------------------------------------------------------------------- Lower Extremity Assessment Details Patient Name: Date of Service: AVIER, JECH 06/02/2022 10:30 A M Medical Record Number: [id number] Patient Account Number: [id number] Date of Birth/Sex: Treating RN: 1961/01/29 (61 y.o. Chad [name] Pole Ojea, [place] ([id number]) [id number] Page 4 of 9 Primary Care Alaria Oconnor: [name] Other Clinician: [name] Referring Cierrah Dace: Treating Franny Selvage/Extender: Eldridge Dace, MICHA EL Latina Craver in Treatment: 25 Electronic Signature(s) Signed: 06/03/2022 8:23:09 AM By: Gretta Cool BSN, RN, CWS, Kim RN, BSN Signed: 06/03/2022 5:15:48 PM By: [name] Entered By: [name] on 06/02/2022 11:04:07 -------------------------------------------------------------------------------- Multi Wound Chart Details Patient Name: Date of Service: JASMAN, MURRI 06/02/2022 10:30 A M Medical Record Number: [id number] Patient Account Number: [id number] Date of Birth/Sex: Treating RN: [birth year] (61 y.o. Chad [name] Primary Care Mikeila Burgen: [name] Other Clinician: [name] Referring Lennin Osmond: Treating Kerston Landeck/Extender: Eldridge Dace, MICHA EL Haze Chad [name] in Treatment: 25 Vital Signs Height(in): 108 Pulse(bpm): 92 Weight(lbs): 150 Blood Pressure(mmHg): 138/87 Body Mass Index(BMI): 23.5 Temperature(F): 97.9 Respiratory Rate(breaths/min): 16 [12:Photos:]  [N/A:N/A] Left, Distal, Medial Ankle N/A N/A Wound Location: Gradually Appeared N/A N/A Wounding Event: Venous Leg Ulcer N/A N/A Primary Etiology: Anemia, Chronic Obstructive N/A N/A Comorbid History: Pulmonary Disease (COPD), Coronary Artery Disease, Peripheral Arterial Disease, Peripheral Venous Disease, Hepatitis C, Osteoarthritis, Neuropathy, Received Chemotherapy 07/12/2019 N/A N/A Date Acquired: 25 N/A N/A [name] of Treatment: Open N/A N/A Wound Status: No N/A N/A Wound Recurrence: 0.5x0.3x0.1 N/A N/A Measurements L x W x D (cm) 0.118 N/A N/A  A (cm) : rea 0.012 N/A N/A Volume (cm) : 98.30% N/A N/A % Reduction in Area: 99.10% N/A N/A % Reduction in Volume: Full Thickness Without Exposed N/A N/A Classification: Support Structures Medium N/A N/A Exudate Amount: Serosanguineous N/A N/A Exudate Type: red, brown N/A N/A Exudate Color: Distinct, outline attached N/A N/A Wound Margin: Large (67-100%) N/A N/A Granulation Amount: Pink N/A N/A Granulation Quality: Small (1-33%) N/A N/A Necrotic Amount: Chad [name] ([id number]) [id number] Page 5 of 9 Fat Layer (Subcutaneous Tissue): Yes N/A N/A Exposed Structures: Fascia: No Tendon: No Muscle: No Joint: No Bone: No Medium (34-66%) N/A N/A Epithelialization: Treatment Notes Electronic Signature(s) Signed: 06/03/2022 5:15:48 PM By: [name] Entered By: [name] on 06/02/2022 11:04:29 -------------------------------------------------------------------------------- [place] Details Patient Name: Date of Service: [name] 06/02/2022 10:30 A M Medical Record Number: [id number] Patient Account Number: [id number] Date of Birth/Sex: Treating RN: [birth year] (61 y.o. Chad [name] Primary Care Adaliz Dobis: [name] Other Clinician: [name] Referring Bader Stubblefield: Treating Latanga Nedrow/Extender: Eldridge Dace, MICHA EL Haze Chad [name] in  Treatment: 25 Active Inactive Venous Leg Ulcer Nursing Diagnoses: Knowledge deficit related to disease process and management Goals: Patient will maintain optimal edema control Date Initiated: 12/06/2021 Date Inactivated: 12/27/2021 Target Resolution Date: 01/03/2022 Goal Status: Met Patient/caregiver will verbalize understanding of disease process and disease management Date Initiated: 12/06/2021 Target Resolution Date: 05/29/2022 Goal Status: Active Interventions: Assess peripheral edema status every visit. Compression as ordered Notes: Electronic Signature(s) Signed: 06/03/2022 8:23:09 AM By: Gretta Cool, BSN, RN, CWS, Kim RN, BSN Signed: 06/03/2022 5:15:48 PM By: [name] Entered By: [name] on 06/02/2022 11:04:11 Chad [name] ([id number]) [id number] Page 6 of 9 -------------------------------------------------------------------------------- Pain Assessment Details Patient Name: Date of Service: KENN, REKOWSKI 06/02/2022 10:30 A M Medical Record Number: [id number] Patient Account Number: [id number] Date of Birth/Sex: Treating RN: [birth year] (61 y.o. Chad [name] Primary Care Sione Baumgarten: [name] Other Clinician: [name] Referring Lakevia Perris: Treating Adelisa Satterwhite/Extender: Eldridge Dace, [place] EL Haze Chad [name] in Treatment: 25 Active Problems Location of Pain Severity and Description of Pain Patient Has Paino No Site Locations Pain Management and Medication Current Pain Management: Electronic Signature(s) Signed: 06/03/2022 8:23:09 AM By: Gretta Cool, BSN, RN, CWS, Kim RN, BSN Signed: 06/03/2022 5:15:48 PM By: [name] Entered By: [name] on 06/02/2022 11:02:59 -------------------------------------------------------------------------------- Patient/Caregiver Education Details Patient Name: Date of Service: [name] 11/6/2023andnbsp10:30 A M Medical Record Number: [id number] Patient Account Number: [id number] Date  of Birth/Gender: Treating RN: 08-13-1960 (61 y.o. Chad [name] Primary Care Physician: [name] Other Clinician: [name] Referring Physician: Treating Physician/Extender: Eldridge Dace, MICHA EL Latina Craver in Treatment: [address], [place] ([id number]) [id number] Page 7 of 9 Education Assessment Education Provided To: Patient Education Topics Provided Wound/Skin Impairment: Handouts: Other: continue wound care as directed Methods: Explain/Verbal Responses: State content correctly Electronic Signature(s) Signed: 06/03/2022 5:15:48 PM By: [name] Entered By: [name] on 06/02/2022 13:26:57 -------------------------------------------------------------------------------- Wound Assessment Details Patient Name: Date of Service: KEYONTAY, STOLZ 06/02/2022 10:30 A M Medical Record Number: [id number] Patient Account Number: [id number] Date of Birth/Sex: Treating RN: 01/08/1961 (61 y.o. Chad [name] Primary Care Dajane Valli: [name] Other Clinician: [name] Referring Toree Edling: Treating Minnie Shi/Extender: Eldridge Dace, MICHA EL Haze Chad [name] in Treatment: 25 Wound Status Wound Number: 12 Primary Venous Leg Ulcer Etiology: Wound Location: Left, Distal, Medial Ankle Wound Open Wounding Event: Gradually Appeared Status: Date Acquired: 07/12/2019 Comorbid Anemia, Chronic Obstructive Pulmonary Disease (COPD), Coronary  [name] Of Treatment: 25 History: Artery Disease, Peripheral Arterial Disease, Peripheral Venous Clustered Wound: No Disease, Hepatitis C, Osteoarthritis, Neuropathy, Received Chemotherapy Photos Wound Measurements Length: (cm) 0.5 Width: (cm) 0.3 Depth: (cm) 0.1 Area: (cm) 0.118 Volume: (cm) 0.012 % Reduction in Area: 98.3% % Reduction in Volume: 99.1% Epithelialization: Medium (34-66%) Wound Description Classification: Full Thickness Without Exposed Support Structures Wound Margin: Distinct,  outline attached [place], Raza ([id number]) Exudate Amount: Medium Exudate Type: Serosanguineous Exudate Color: red, brown Foul Odor After Cleansing: No Slough/Fibrino Yes [id number] Page 8 of 9 Wound Bed Granulation Amount: Large (67-100%) Exposed Structure Granulation Quality: Pink Fascia Exposed: No Necrotic Amount: Small (1-33%) Fat Layer (Subcutaneous Tissue) Exposed: Yes Necrotic Quality: Adherent Slough Tendon Exposed: No Muscle Exposed: No Joint Exposed: No Bone Exposed: No Treatment Notes Wound #12 (Ankle) Wound Laterality: Left, Medial, Distal Cleanser Soap and Water Discharge Instruction: Gently cleanse wound with antibacterial soap, rinse and pat dry prior to dressing wounds Wound Cleanser Discharge Instruction: Wash your hands with soap and water. Remove old dressing, discard into plastic bag and place into trash. Cleanse the wound with Wound Cleanser prior to applying a clean dressing using gauze sponges, not tissues or cotton balls. Do not scrub or use excessive force. Pat dry using gauze sponges, not tissue or cotton balls. Peri-Wound Care Topical Desitin Maximum Strength Ointment, 1 (oz) tube Primary Dressing Hydrofera Blue Ready Transfer Foam, 2.5x2.5 (in/in) Discharge Instruction: Apply Hydrofera Blue Ready to wound bed as directed Secondary Dressing ABD Pad 5x9 (in/in) Discharge Instruction: Cover with ABD pad Secured With Medipore T - 53M Medipore H Soft Cloth Surgical T ape ape, 2x2 (in/yd) Kerlix Roll Sterile or Non-Sterile 6-ply 4.5x4 (yd/yd) Discharge Instruction: Apply Kerlix as directed Tubigrip Size C, 2.75x10 (in/yd) Discharge Instruction: Apply 3 Tubigrip C 3-finger-widths below knee to base of toes to secure dressing and/or for swelling. This can be hand washed and air dried Compression Wrap Compression Stockings Add-Ons Electronic Signature(s) Signed: 06/03/2022 8:23:09 AM By: Gretta Cool, BSN, RN, CWS, Kim RN,  BSN Signed: 06/03/2022 5:15:48 PM By: [name] Entered By: [name] on 06/02/2022 11:03:55 -------------------------------------------------------------------------------- Vitals Details Patient Name: Date of Service: [name] 06/02/2022 10:30 A M Medical Record Number: [id number] Patient Account Number: [id number] Date of Birth/Sex: Treating RN: [birth year] (61 y.o. Chad [name] [place], [place] ([id number]) [id number] Page 9 of 9 Primary Care Modest Draeger: [name] Other Clinician: [name] Referring Lancelot Alyea: Treating Nikoletta Varma/Extender: Eldridge Dace, [place] EL Haze Chad [name] in Treatment: 25 Vital Signs Time Taken: 10:53 Temperature (F): 97.9 Height (in): 67 Pulse (bpm): 79 Weight (lbs): 150 Respiratory Rate (breaths/min): 16 Body Mass Index (BMI): 23.5 Blood Pressure (mmHg): 138/87 Reference Range: 80 - 120 mg / dl Electronic Signature(s) Signed: 06/03/2022 5:15:48 PM By: [name] Entered By: [name] on 06/02/2022 11:02:54

## 2022-06-04 NOTE — Progress Notes (Signed)
Chad [name], Chad [name] ([id number]) [id number] Page 1 of 10 Visit Report for 06/02/2022 HPI Details Patient Name: Date of Service: [name], [name] 06/02/2022 10:30 A M Medical Record Number: [id number] Patient Account Number: [id number] Date of Birth/Sex: Treating RN: [birth year] (61 y.o. [name] Primary Care Provider: [name] Other Clinician: [name] Referring Provider: Treating Provider/Extender: Eldridge Dace, [place] EL Haze Justin Weeks in Treatment: 25 History of Present Illness HPI Description: 10/08/18 on evaluation today patient actually presents to our office for initial evaluation concerning wounds that he has of the bilateral lower extremities. 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 He also has bilateral venous stasis. He apparently has been homeless currently following discharge from the hospital apparently he has been placed at [facility] which is is a skilled nursing facility locally. Nonetheless fortunately he does not show any signs of infection at this time which is good news. In fact several of the wound actually appears to be showing some signs of improvement already in my pinion. There are a couple areas in the left leg in particular there likely gonna require some sharp debridement to help clear away some necrotic tissue and help with more sufficient healing. No fevers, chills, nausea, or vomiting noted at this time. 10/15/18 on evaluation today patient actually appears to be doing very well in regard to his bilateral lower extremities. He's been tolerating the dressing changes without complication. Fortunately there does not appear to be any evidence of active infection at this time which is great news. Overall I'm actually very pleased with how this has progressed in just  one visits time. Readmission: 08/14/2020 upon evaluation today patient presents for re-evaluation here in our clinic. He is having issues with his left ankle region as well as his right toe and his right heel. He tells me that the toe and heel actually began as a area that was itching that he was scratching and then subsequently opened up into wounds. These may have been abscess areas I presume based on what I am seeing currently. With regard to his left ankle region he tells me this was a similar type occurrence although he does have venous stasis this very well may be more of a venous leg ulcer more than anything. Nonetheless I do believe that the patient would benefit from appropriate and aggressive wound care to try to help get things under better control here. He does have history of a stroke on the left side affecting him to some degree there that he is able to stand although he does have some residual weakness. Otherwise again the patient does have chronic venous insufficiency as previously noted. His arterial studies most recently obtained showed that he had an ABI on the right of 1.16 with a TBI of 0.52 and on the left and ABI of 1.14 with a TBI of 0.81. That was obtained on 06/19/2020. 08/28/2020 upon evaluation today patient appears to be doing decently well in regard to his wounds in general. He has been tolerating the dressing changes without complication. Fortunately there does not appear to be any signs of active infection which is great news. With that being said I think the [facility] is doing a good job I would recommend that we likely continue with that currently. 09/11/2020 upon evaluation today patient's wounds did not appear to be doing too  poorly but again he is not really showing signs of significant improvement with regard to any of the wounds on the right. None of them have Hydrofera Blue on them I am not exactly sure why this is not being followed as the facility did not  contact us to let us know of any issues with obtaining dressings or otherwise. With that being said he is supposed to be using Hydrofera Blue on both of the wounds on the right foot as well as the ankle wound on the left side. 09/18/2020 upon evaluation today patient appears to be doing poorly with regard to his wounds. Again right now the left ankle in particular showed signs of extreme maceration. Apparently he was told by someone with staff at [place] they could not get the [facility]. With that being said this is something that is never been relayed to [place] one way or another. Also the patient subsequently has not supposed to have a border gauze dressing on. He should have an ABD pad and roll gauze to secure as this drains much too much just to have a border gauze dressing to cover. Nonetheless the fact that they are not using the appropriate dressing is directly causing deterioration of the left ankle wound it is significantly worse today compared to what it was previous. I did attempt to call [facility] while the patient was here I called three times and got no one to even pick up the phone. After this I had my for an office coordinator call and she was able to finally get through and leave a message with the D ON as of dictation of this note which is roughly about an hour and a half later I still have not been able to speak with anyone at the facility. 09/25/2020 upon evaluation today patient actually showing signs of good improvement which is excellent news. He has been tolerating the dressing changes without complication. Fortunately there is no signs of active infection which is great news. No fevers, chills, nausea, vomiting, or diarrhea. I do feel like the facility has been doing a much better job at taking care of him as far as the dressings are concerned. However the director of nursing never did call me back. 10/09/2020 upon evaluation today patient appears to be doing  well with regard to his wound. The toe ulcer did require some debridement but the other 2 areas actually appear to be doing quite well. 10/19/2020 upon evaluation today patient actually appears to be doing very well in regard to his wounds. In fact the heel does appear to be completely healed. The toe is doing better in the medial ankle on the left is also doing better. Overall I think he is headed in the right direction. 10/26/2020 upon evaluation today patient appears to be doing well with regard to his wound. He is showing signs of improvement which is great news and overall I am very pleased with where things stand today. No fevers, chills, nausea, vomiting, or diarrhea. 11/02/2020 upon evaluation today patient appears to be doing well with regard to his wounds. He has been tolerating the dressing changes without complication overall I am extremely pleased with where things stand today. He in regard to the toe is almost completely healed and the medial ankle on the left is doing much better. LANGDON, CROSSON ([id number]) [id number] Page 2 of 10 11/09/2020 upon evaluation today patient appears to be doing a little poorly in regard to his left medial ankle ulcer.  Fortunately there does not appear to be any signs of systemic infection but unfortunately locally he does appear to be infected in fact he has blue-green drainage consistent with Pseudomonas. 11/16/2020 upon evaluation today patient appears to be doing well with regard to his wound. It actually appears to be doing better. I did place him on gentamicin cream since the Cipro was actually resistant even though he was positive for Pseudomonas on culture. Overall I think that he does seem to be doing better though I am unsure whether or not they have actually been putting the cream on. The patient is not sure that we did talk to the nurse directly and she was going to initiate that treatment. Fortunately there does not  appear to be any signs of active infection at this time. No fevers, chills, nausea, vomiting, or diarrhea. 4/28; the area on the right second toe is close to healed. Left medial ankle required debridement 12/07/2020 upon evaluation today patient appears to be doing well with regard to his wounds. In fact the right second toe appears to be completely healed which is great news. Fortunately there does not appear to be any signs of active infection at this time which is also great news. I think we can probably discontinue the gentamicin on top of everything else. 12/14/2020 upon evaluation today patient appears to be doing well with regard to his wound. He is making good progress and overall very pleased with where things stand today. There is no signs of active infection at this time which is great news. 12/28/2020 upon evaluation today patient appears to be doing well with regard to his wounds. He has been tolerating the dressing changes without complication. Fortunately there is no signs of active infection at this time. No fevers, chills, nausea, vomiting, or diarrhea. 12/28/2020 upon evaluation today patient's wound bed actually showed signs of excellent improvement. He has great epithelization and granulation I do not see any signs of infection overall I am extremely pleased with where things stand at this point. No fevers, chills, nausea, vomiting, or diarrhea. 01/11/2021 upon evaluation today patient appears to be doing well with regard to his wound on his leg. He has been tolerating the dressing changes without complication. Fortunately there does not appear to be any signs of active infection which is great news. No fevers, chills, nausea, vomiting, or diarrhea. 01/25/2021 upon evaluation today patient appears to be doing well with regard to his wound. He has been tolerating the dressing changes without complication. Fortunately the collagen seems to be doing a great job which is excellent news. No  fevers, chills, nausea, vomiting, or diarrhea. 02/08/2021 upon evaluation today patient's wound is actually looking a little bit worse especially in the periwound compared to previous. Fortunately there does not appear to be any signs of infection which is great news with that being said he does have some irritation around the periphery of the wound which has me more concerned. He actually had a dressing on that had not been changed in 3 days. He also is supposed to have daily dressing changes. With regard to the dressing applied he had a silver alginate dressing and silver collagen is what is recommended and ordered. He also had no Desitin around the edges of the wound in the periwound region although that is on the order inspect to be done as well. In general I was very concerned I did contact [facility] actually spoke with Magda Paganini who is the scheduling individual and subsequently she  stated that she would pass the information to the D ON apparently the D ON was not available to talk to me when I call today. 02/18/2021 upon evaluation today patient's wound is actually showing signs of improvement. Fortunately there does not appear to be any evidence of infection which is great news overall I am extremely pleased with where things stand today. No fevers, chills, nausea, vomiting, or diarrhea. 8/3; patient presents for 1 week follow-up. He has no issues or complaints today. He denies signs of infection. 03/11/2021 upon evaluation today patient appears to be doing well with regard to his wound. He does have a little bit of slough noted on the surface of the wound but fortunately there does not appear to be any signs of active infection at this time. No fevers, chills, nausea, vomiting, or diarrhea. 03/18/2021 upon evaluation today patient appears to be doing well with regard to his wound. He has been tolerating the dressing changes without complication. There was a little irritation more proximal to  where the wound was that was not noted last week but nonetheless this is very superficial just seems to be more irritation we just need to make sure to put a good amount of the zinc over the area in my opinion. Otherwise he does not seem to be doing significantly worse at all which is great news. 03/25/2021 upon evaluation today patient appears to be doing well with regard to his wound. He is going require some sharp debridement today to clear with some of the necrotic debris. I did perform this today without complication postdebridement wound bed appears to be doing much better this is great news. 04/08/2021 upon evaluation today patient appears to be doing decently well in regard to his wound although the overall measurement is not significantly smaller compared to previous. It is gone down a little bit but still the facility continues to not really put the appropriate dressings in place in fact he was supposed to have collagen we think he probably had more of an allergy to At this point. Fortunately there does not appear to be any signs of active infection systemically though locally I do not see anything on initial visualization either as far as erythema or warmth. 04/15/2021 upon evaluation today patient appears to be doing well with regard to his wound. He is actually showing signs of improvement. I did place him on antibiotics last week, Cipro. He has been taking that 2 times a day and seems to be tolerating it very well. I do not see any evidence of worsening and in fact the overall appearance of the wound is smaller today which is also great news. 9/26; left medial ankle chronic venous insufficiency wound is improved. Using Hydrofera Blue 10/10; left medial ankle chronic venous insufficiency. Wound has not changed much in appearance completely nonviable surface. Apparently there have been problems getting the right product on the wound at the facility although he came in with [facility] on  today 05/14/2021 upon evaluation today patient appears to be doing well with regard to his wound. I think he is making progress here which is good news. Fortunately there does not appear to be any signs of active infection at this time. No fevers, chills, nausea, vomiting, or diarrhea. 05/20/2021 upon evaluation today patient appears to be doing well with regard to his wound. He is showing signs of good improvement which is great news. There does not appear to be any evidence of active infection which is also excellent  news. No fevers, chills, nausea, vomiting, or diarrhea. 05/28/2021 upon evaluation today patient appears to be doing quite well. There does not appear to be any signs of active infection at this time which is great news. Overall I am extremely pleased with where things stand today. I think he is headed in the right direction. 06/11/2021 upon evaluation today patient appears to be doing well with regard to his left ankle ulcer and poorly in regard to the toe ulcer on the second toe right foot. This appears to show signs of joint exposure. Apparently this has been present for 1 to 2 months although he kept forgetting to tell me about it. That is unfortunate as right now it definitely appears to be doing significantly worse than what I would like to see. There does not appear to be any signs of active infection systemically though locally I am concerned about the possibility of infection the toe is quite red. Again no one from the facility ever contacted [place] to advise that this was going on in the interim either. 06/17/2021 upon evaluation today patient presents for follow-up I did review his x-ray which showed a navicular bone fracture I am unsure of the chronicity of this. Subsequently he also had osteomyelitis of the toe which was what I was more concerned about this did not show up on x-ray but did show up on the pathology scrapings. This was listed as acute osteomyelitis. Nonetheless at  this point I think that the antibiotic treatment is the best regimen to go with currently. The patient is in agreement with that plan. Nonetheless he has initially 30 days of doxycycline off likely extend that towards the end of the treatment cycle that will be around the middle of December for an additional 2 weeks. That all depends on how well he continues to heal. Nonetheless based on what I am seeing in the foot I did want a proceed with an MRI as well which I think will be helpful to identify if there is anything else that needs to be addressed from the standpoint of infection. 06/24/2021 upon evaluation today patient appears to be doing pretty well in regards to his wounds. I think both are actually showing signs of improvement which is good I did review his MRI today which did show signs of osteomyelitis of the middle and proximal phalanx on his right foot of the affected toe. With that being said this is actually showing signs of significant improvement today already with the antibiotic therapy I think the redness is also improved. Overall I DEMONIE, KASSA ([id number]) [id number] Page 3 of 10 think that we just need to give this some time with appropriate wound care we will see how things go potentially hyperbarics could be considered. 07/02/2021 upon inspection today patient actually appears to be doing well in regard to his left ankle which is getting very close to complete resolution of pleased in that regard. Unfortunately he is continuing to have issues with his second toe right foot and this seems to still be very painful for him. Recommend he try something different from the standpoint of antibiotics. 07/15/2021 upon evaluation today patient appears to be doing actually pretty well in regard to his foot. This is actually showing signs of significant improvement which is great news. Overall I feel like the patient is improving both in regard to the second toe  as well as the ankle on the left. With that being said the biggest issue that I do see currently  is that he is needing to have a refill of the doxycycline that we previously treated him with. He also did see podiatry they are not going to recommend any amputation at this point since he seems to be doing quite well. For that reason we just need to keep things under control from an infection standpoint. 08/01/2021 upon evaluation today patient appears to be doing well with regard to his wound. He has been tolerating the dressing changes without complication. Fortunately there does not appear to be any evidence of active infection locally nor systemically at this point. In fact I think everything is doing excellent in fact his second toe on the right foot is almost healed and the ankle on the left ankle region is actually very close to being healed as well. 08/08/2021 upon evaluation today patient appears to be doing well with regard to his wound. He has been tolerating the dressing changes without complication. Fortunately I do not see any signs of active infection at this time. Readmission: 12-06-2021 upon evaluation today patient presents for reevaluation here in the clinic he does tell me that he was being seen in facility at [facility] by a provider that was coming in. He is not sure who this was. He tells me however that the wound seems to have gotten worse even compared to where it was when we last saw him at this point. With that being said I do believe that he is likely going need ongoing wound care here in the clinic and I do believe that we need to be the ones to frontline this since his wound does seem to be getting worse not better at this point. He voiced understanding. He is also in agreement with this plan and feels more comfortable coming here she tells me. Patient's medical history really has not changed since his prior admission he was only gone since January. 12-27-2021 upon  evaluation today patient appears to be doing well with regard to his wound they did run out of the [facility] so they did not put anything on just an ABD pad with gentamicin. Still we are seeing some signs of good improvement here with some new epithelization which is great news. 01-10-2022 upon evaluation today patient appears to be doing well with regard to his wounds and he is going require some sharp debridement but overall seems to be making good progress. Fortunately I do not see any evidence of active infection locally or systemically at this time which is great news. 01-24-2022 upon evaluation today patient appears to be doing well with regard to his wound. The facility actually came and dropped him off early and he had another appointment at the hospital and then they just brought him over here and this was still hours before his appointment this afternoon. For that reason we did do our best to work him in this morning and fortunately had some space to make this happen. With that being said patient's wound does seem to be making progress here and I am very pleased in that regard I do not see any signs of active infection locally or systemically at this time. 02-07-2022 patient appears to be doing well currently in regard to his wounds. In fact one of them the more proximal is healed the distal is still open but seems to be doing excellent. Fortunately I do not see any evidence of active infection locally or systemically at this time which is great news. No fevers, chills, nausea, vomiting, or  diarrhea. 7/27; left medial ankle venous. Improving per our intake nurse. We are using Hydrofera Blue under Tubigrip compression. They are changing that at his facility 03-06-2022 upon evaluation today patient appears to be doing well with regard to the his wound he is can require some sharp debridement but seems to be making excellent progress. Fortunately I do not see any evidence of active infection  locally or systemically which is great news. 03-27-2022 upon evaluation today patient's wound is actually showing signs of excellent improvement. Fortunately I see no signs of active infection locally or systemically at this time which is great news. No fevers, chills, nausea, vomiting, or diarrhea. 04-21-2022 upon evaluation today patient appears to be doing excellent in regard to his wound in fact this is very close to resolution based on what I am seeing. I do not see any evidence of active infection locally or systemically at this time which is great news and overall I am extremely pleased with where we are today. 05-05-2022 upon evaluation today patient's wound actually appears to potentially be completely healed. Fortunately I do not see any evidence of active infection at this time which is great news and overall I am very pleased I think this needs a little time to toughen up but other than that I really do believe were doing quite well. He is very pleased to hear this its been a long time coming. 05-19-2022 upon evaluation today patient appears to be doing well currently in regard to his wound. He in fact we were hoping will be completely healed and closed but it still has a very tiny area right in the center which is still continuing to drain. Fortunately I do not see any signs of infection locally or systemically which is great news. 11/6; this wound is close to completely" he comes in this week with some erythema at 1 edge of this which looks like something was rubbing on the area. Other than that no evidence of infection Electronic Signature(s) Signed: 06/02/2022 4:14:14 PM By: Linton Ham MD Entered By: Linton Ham on 06/02/2022 11:49:53 Physical Exam Details -------------------------------------------------------------------------------- [name] ([id number]) [id number] Page 4 of 10 Patient Name: Date of Service: Chad [name], Chad [name] 06/02/2022  10:30 A M Medical Record Number: [id number] Patient Account Number: [id number] Date of Birth/Sex: Treating RN: 09-03-1960 (61 y.o. [name] Primary Care Provider: [name] Other Clinician: [name] Referring Provider: Treating Provider/Extender: Eldridge Dace, [place] EL Haze Justin Weeks in Treatment: 25 Notes Wound exam; the patient's wound is just about closed only 2 tiny areas remain edema control is reasonably good however just posterior and superior to the wound is an area of localized erythema which looks like an abrasion injury Electronic Signature(s) Signed: 06/02/2022 4:14:14 PM By: Linton Ham MD Entered By: Linton Ham on 06/02/2022 11:55:53 -------------------------------------------------------------------------------- Physician Orders Details Patient Name: Date of Service: Chad [name] BERT 06/02/2022 10:30 A M Medical Record Number: [id number] Patient Account Number: [id number] Date of Birth/Sex: Treating RN: 1961/05/19 (61 y.o. [name] Primary Care Provider: [name] Other Clinician: [name] Referring Provider: Treating Provider/Extender: RO BSO Delane Ginger, [place] EL Latina Craver in Treatment: 25 Verbal / Phone Orders: No Diagnosis Coding Follow-up Appointments Return Appointment in 2 weeks. Bathing/ [employer] wounds with antibacterial soap and water. No tub bath. Anesthetic (Use 'Patient Medications' Section for Anesthetic Order Entry) Lidocaine applied to wound bed Edema Control - Lymphedema / Segmental Compressive Device / Other Tubigrip single layer applied. - Tubi  C left lower leg Medications-Please add to medication list. Other: - apply Desitin to periwound Wound Treatment Wound #12 - Ankle Wound Laterality: Left, Medial, Distal Cleanser: Soap and Water 3 x Per Week/30 Days Discharge Instructions: Gently cleanse wound with antibacterial soap, rinse and pat dry prior to dressing wounds Cleanser: Wound  Cleanser 3 x Per Week/30 Days Discharge Instructions: Wash your hands with soap and water. Remove old dressing, discard into plastic bag and place into trash. Cleanse the wound with Wound Cleanser prior to applying a clean dressing using gauze sponges, not tissues or cotton balls. Do not scrub or use excessive force. Pat dry using gauze sponges, not tissue or cotton balls. Topical: Desitin Maximum Strength Ointment, 1 (oz) tube 3 x Per Week/30 Days Prim Dressing: Hydrofera Blue Ready Transfer Foam, 2.5x2.5 (in/in) 3 x Per Week/30 Days ary Discharge Instructions: Apply Hydrofera Blue Ready to wound bed as directed Secondary Dressing: ABD Pad 5x9 (in/in) 3 x Per Week/30 Days Discharge Instructions: Cover with ABD pad Secured With: Medipore T - 108M Medipore H Soft Cloth Surgical T ape ape, 2x2 (in/yd) 3 x Per Week/30 Days LORRIN, NAWROT ([id number]) [id number] Page 5 of 10 Secured With: [employer] or Non-Sterile 6-ply 4.5x4 (yd/yd) 3 x Per Week/30 Days Discharge Instructions: Apply Kerlix as directed Secured With: Tubigrip Size C, 2.75x10 (in/yd) 3 x Per Week/30 Days Discharge Instructions: Apply 3 Tubigrip C 3-finger-widths below knee to base of toes to secure dressing and/or for swelling. This can be hand washed and air dried Electronic Signature(s) Signed: 06/02/2022 4:14:14 PM By: Linton Ham MD Signed: 06/03/2022 5:15:48 PM By: [name] Entered By: [name] on 06/02/2022 11:17:29 -------------------------------------------------------------------------------- Problem List Details Patient Name: Date of Service: Chad [name] BERT 06/02/2022 10:30 A M Medical Record Number: [id number] Patient Account Number: [id number] Date of Birth/Sex: Treating RN: 02/18/1961 (61 y.o. 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 [name] Primary Care Provider: [name] Other Clinician: [name] Referring Provider: Treating Provider/Extender: Eldridge Dace, [place] EL Haze Justin Weeks in Treatment: 25 Subjective History of Present Illness (HPI) 10/08/18 on evaluation today patient actually presents to our office for initial evaluation concerning wounds that he has of the bilateral lower extremities. 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 He also has bilateral venous stasis. He apparently has been homeless currently following discharge from the hospital apparently he has been placed at [facility] which is is a skilled nursing facility locally. Nonetheless fortunately he does not show any signs of infection at this time which  is good news. In fact several of the wound actually  appears to be showing some signs of improvement already in my pinion. There are a couple areas in the left leg in particular there likely gonna require some sharp debridement to help clear away some necrotic tissue and help with more sufficient healing. No fevers, chills, nausea, or vomiting noted at this time. 10/15/18 on evaluation today patient actually appears to be doing very well in regard to his bilateral lower extremities. He's been tolerating the dressing changes without complication. Fortunately there does not appear to be any evidence of active infection at this time which is great news. Overall I'm actually very pleased with how this has progressed in just one visits time. Readmission: 08/14/2020 upon evaluation today patient presents for re-evaluation here in our clinic. He is having issues with his left ankle region as well as his right toe and his right heel. He tells me that the toe and heel actually began as a area that was itching that he was scratching and then subsequently opened up into wounds. These may have been abscess areas I presume based on what I am seeing currently. With regard to his left ankle region he tells me this was a similar type occurrence although he does have venous stasis this very well may be more of a venous leg ulcer more than anything. Nonetheless I do believe that the patient would benefit from appropriate and aggressive wound care to try to help get things under better control here. He does have history of a stroke on the left side affecting him to some degree there that he is able to stand although he does have some residual weakness. Otherwise again the patient does have chronic venous insufficiency as previously noted. His arterial studies most recently obtained showed that he had an ABI on the right of 1.16 with a TBI of 0.52 and on the left and ABI of 1.14 with a TBI of 0.81. That was obtained on 06/19/2020. 08/28/2020 upon evaluation today patient  appears to be doing decently well in regard to his wounds in general. He has been tolerating the dressing changes without complication. Fortunately there does not appear to be any signs of active infection which is great news. With that being said I think the [facility] is doing a good job I would recommend that we likely continue with that currently. 09/11/2020 upon evaluation today patient's wounds did not appear to be doing too poorly but again he is not really showing signs of significant improvement with regard to any of the wounds on the right. None of them have Hydrofera Blue on them I am not exactly sure why this is not being followed as the facility did not contact us to let us know of any issues with obtaining dressings or otherwise. With that being said he is supposed to be using Hydrofera Blue on both of the wounds on the right foot as well as the ankle wound on the left side. 09/18/2020 upon evaluation today patient appears to be doing poorly with regard to his wounds. Again right now the left ankle in particular showed signs of extreme maceration. Apparently he was told by someone with staff at [facility] they could not get the [facility]. With that being said this is something that is never been relayed to [place] one way or another. Also the patient subsequently has not supposed to have a border gauze dressing on. He should have an ABD pad and roll  gauze to secure as this drains much too much just to have a border gauze dressing to cover. Nonetheless the fact that they are not using the appropriate dressing is directly causing deterioration of the left ankle wound it is significantly worse today compared to what it was previous. I did attempt to call [facility] while the patient was here I called three times and got no one to even pick up the phone. After this I had my for an office coordinator call and she was able to finally get through and leave a message with the D  ON as of dictation of this note which is roughly about an hour and a half later I still have not been able to speak with anyone at the facility. 09/25/2020 upon evaluation today patient actually showing signs of good improvement which is excellent news. He has been tolerating the dressing changes without complication. Fortunately there is no signs of active infection which is great news. No fevers, chills, nausea, vomiting, or diarrhea. I do feel like the facility has been doing a much better job at taking care of him as far as the dressings are concerned. However the director of nursing never did call me back. 10/09/2020 upon evaluation today patient appears to be doing well with regard to his wound. The toe ulcer did require some debridement but the other 2 areas actually appear to be doing quite well. 10/19/2020 upon evaluation today patient actually appears to be doing very well in regard to his wounds. In fact the heel does appear to be completely healed. The toe is doing better in the medial ankle on the left is also doing better. Overall I think he is headed in the right direction. 10/26/2020 upon evaluation today patient appears to be doing well with regard to his wound. He is showing signs of improvement which is great news and overall I am very pleased with where things stand today. No fevers, chills, nausea, vomiting, or diarrhea. 11/02/2020 upon evaluation today patient appears to be doing well with regard to his wounds. He has been tolerating the dressing changes without complication overall I am extremely pleased with where things stand today. He in regard to the toe is almost completely healed and the medial ankle on the left is doing much better. 11/09/2020 upon evaluation today patient appears to be doing a little poorly in regard to his left medial ankle ulcer. Fortunately there does not appear to be any signs of systemic infection but unfortunately locally he does appear to be infected in  fact he has blue-green drainage consistent with Pseudomonas. 11/16/2020 upon evaluation today patient appears to be doing well with regard to his wound. It actually appears to be doing better. I did place him on gentamicin cream since the Cipro was actually resistant even though he was positive for Pseudomonas on culture. Overall I think that he does seem to be doing better Chad [name], Chad [name] ([id number]) [id number] Page 7 of 10 though I am unsure whether or not they have actually been putting the cream on. The patient is not sure that we did talk to the nurse directly and she was going to initiate that treatment. Fortunately there does not appear to be any signs of active infection at this time. No fevers, chills, nausea, vomiting, or diarrhea. 4/28; the area on the right second toe is close to healed. Left medial ankle required debridement 12/07/2020 upon evaluation today patient appears to be doing well with regard to his wounds. In fact  the right second toe appears to be completely healed which is great news. Fortunately there does not appear to be any signs of active infection at this time which is also great news. I think we can probably discontinue the gentamicin on top of everything else. 12/14/2020 upon evaluation today patient appears to be doing well with regard to his wound. He is making good progress and overall very pleased with where things stand today. There is no signs of active infection at this time which is great news. 12/28/2020 upon evaluation today patient appears to be doing well with regard to his wounds. He has been tolerating the dressing changes without complication. Fortunately there is no signs of active infection at this time. No fevers, chills, nausea, vomiting, or diarrhea. 12/28/2020 upon evaluation today patient's wound bed actually showed signs of excellent improvement. He has great epithelization and granulation I do not see any signs of  infection overall I am extremely pleased with where things stand at this point. No fevers, chills, nausea, vomiting, or diarrhea. 01/11/2021 upon evaluation today patient appears to be doing well with regard to his wound on his leg. He has been tolerating the dressing changes without complication. Fortunately there does not appear to be any signs of active infection which is great news. No fevers, chills, nausea, vomiting, or diarrhea. 01/25/2021 upon evaluation today patient appears to be doing well with regard to his wound. He has been tolerating the dressing changes without complication. Fortunately the collagen seems to be doing a great job which is excellent news. No fevers, chills, nausea, vomiting, or diarrhea. 02/08/2021 upon evaluation today patient's wound is actually looking a little bit worse especially in the periwound compared to previous. Fortunately there does not appear to be any signs of infection which is great news with that being said he does have some irritation around the periphery of the wound which has me more concerned. He actually had a dressing on that had not been changed in 3 days. He also is supposed to have daily dressing changes. With regard to the dressing applied he had a silver alginate dressing and silver collagen is what is recommended and ordered. He also had no Desitin around the edges of the wound in the periwound region although that is on the order inspect to be done as well. In general I was very concerned I did contact [facility] actually spoke with Magda Paganini who is the scheduling individual and subsequently she stated that she would pass the information to the D ON apparently the D ON was not available to talk to me when I call today. 02/18/2021 upon evaluation today patient's wound is actually showing signs of improvement. Fortunately there does not appear to be any evidence of infection which is great news overall I am extremely pleased with where things  stand today. No fevers, chills, nausea, vomiting, or diarrhea. 8/3; patient presents for 1 week follow-up. He has no issues or complaints today. He denies signs of infection. 03/11/2021 upon evaluation today patient appears to be doing well with regard to his wound. He does have a little bit of slough noted on the surface of the wound but fortunately there does not appear to be any signs of active infection at this time. No fevers, chills, nausea, vomiting, or diarrhea. 03/18/2021 upon evaluation today patient appears to be doing well with regard to his wound. He has been tolerating the dressing changes without complication. There was a little irritation more proximal to where the wound  was that was not noted last week but nonetheless this is very superficial just seems to be more irritation we just need to make sure to put a good amount of the zinc over the area in my opinion. Otherwise he does not seem to be doing significantly worse at all which is great news. 03/25/2021 upon evaluation today patient appears to be doing well with regard to his wound. He is going require some sharp debridement today to clear with some of the necrotic debris. I did perform this today without complication postdebridement wound bed appears to be doing much better this is great news. 04/08/2021 upon evaluation today patient appears to be doing decently well in regard to his wound although the overall measurement is not significantly smaller compared to previous. It is gone down a little bit but still the facility continues to not really put the appropriate dressings in place in fact he was supposed to have collagen we think he probably had more of an allergy to At this point. Fortunately there does not appear to be any signs of active infection systemically though locally I do not see anything on initial visualization either as far as erythema or warmth. 04/15/2021 upon evaluation today patient appears to be doing well with  regard to his wound. He is actually showing signs of improvement. I did place him on antibiotics last week, Cipro. He has been taking that 2 times a day and seems to be tolerating it very well. I do not see any evidence of worsening and in fact the overall appearance of the wound is smaller today which is also great news. 9/26; left medial ankle chronic venous insufficiency wound is improved. Using Hydrofera Blue 10/10; left medial ankle chronic venous insufficiency. Wound has not changed much in appearance completely nonviable surface. Apparently there have been problems getting the right product on the wound at the facility although he came in with [facility] on today 05/14/2021 upon evaluation today patient appears to be doing well with regard to his wound. I think he is making progress here which is good news. Fortunately there does not appear to be any signs of active infection at this time. No fevers, chills, nausea, vomiting, or diarrhea. 05/20/2021 upon evaluation today patient appears to be doing well with regard to his wound. He is showing signs of good improvement which is great news. There does not appear to be any evidence of active infection which is also excellent news. No fevers, chills, nausea, vomiting, or diarrhea. 05/28/2021 upon evaluation today patient appears to be doing quite well. There does not appear to be any signs of active infection at this time which is great news. Overall I am extremely pleased with where things stand today. I think he is headed in the right direction. 06/11/2021 upon evaluation today patient appears to be doing well with regard to his left ankle ulcer and poorly in regard to the toe ulcer on the second toe right foot. This appears to show signs of joint exposure. Apparently this has been present for 1 to 2 months although he kept forgetting to tell me about it. That is unfortunate as right now it definitely appears to be doing significantly worse  than what I would like to see. There does not appear to be any signs of active infection systemically though locally I am concerned about the possibility of infection the toe is quite red. Again no one from the facility ever contacted [place] to advise that this was going  on in the interim either. 06/17/2021 upon evaluation today patient presents for follow-up I did review his x-ray which showed a navicular bone fracture I am unsure of the chronicity of this. Subsequently he also had osteomyelitis of the toe which was what I was more concerned about this did not show up on x-ray but did show up on the pathology scrapings. This was listed as acute osteomyelitis. Nonetheless at this point I think that the antibiotic treatment is the best regimen to go with currently. The patient is in agreement with that plan. Nonetheless he has initially 30 days of doxycycline off likely extend that towards the end of the treatment cycle that will be around the middle of December for an additional 2 weeks. That all depends on how well he continues to heal. Nonetheless based on what I am seeing in the foot I did want a proceed with an MRI as well which I think will be helpful to identify if there is anything else that needs to be addressed from the standpoint of infection. 06/24/2021 upon evaluation today patient appears to be doing pretty well in regards to his wounds. I think both are actually showing signs of improvement which is good I did review his MRI today which did show signs of osteomyelitis of the middle and proximal phalanx on his right foot of the affected toe. With that being said this is actually showing signs of significant improvement today already with the antibiotic therapy I think the redness is also improved. Overall I think that we just need to give this some time with appropriate wound care we will see how things go potentially hyperbarics could be considered. 07/02/2021 upon inspection today patient  actually appears to be doing well in regard to his left ankle which is getting very close to complete resolution of pleased in that regard. Unfortunately he is continuing to have issues with his second toe right foot and this seems to still be very painful for him. Recommend he try something different from the standpoint of antibiotics. Chad [name], Chad [name] ([id number]) [id number] Page 8 of 10 07/15/2021 upon evaluation today patient appears to be doing actually pretty well in regard to his foot. This is actually showing signs of significant improvement which is great news. Overall I feel like the patient is improving both in regard to the second toe as well as the ankle on the left. With that being said the biggest issue that I do see currently is that he is needing to have a refill of the doxycycline that we previously treated him with. He also did see podiatry they are not going to recommend any amputation at this point since he seems to be doing quite well. For that reason we just need to keep things under control from an infection standpoint. 08/01/2021 upon evaluation today patient appears to be doing well with regard to his wound. He has been tolerating the dressing changes without complication. Fortunately there does not appear to be any evidence of active infection locally nor systemically at this point. In fact I think everything is doing excellent in fact his second toe on the right foot is almost healed and the ankle on the left ankle region is actually very close to being healed as well. 08/08/2021 upon evaluation today patient appears to be doing well with regard to his wound. He has been tolerating the dressing changes without complication. Fortunately I do not see any signs of active infection at this time. Readmission: 12-06-2021 upon evaluation today  patient presents for reevaluation here in the clinic he does tell me that he was being seen in facility at [facility] by a provider that was coming in. He is not sure who this was. He tells me however that the wound seems to have gotten worse even compared to where it was when we last saw him at this point. With that being said I do believe that he is likely going need ongoing wound care here in the clinic and I do believe that we need to be the ones to frontline this since his wound does seem to be getting worse not better at this point. He voiced understanding. He is also in agreement with this plan and feels more comfortable coming here she tells me. Patient's medical history really has not changed since his prior admission he was only gone since January. 12-27-2021 upon evaluation today patient appears to be doing well with regard to his wound they did run out of the [facility] so they did not put anything on just an ABD pad with gentamicin. Still we are seeing some signs of good improvement here with some new epithelization which is great news. 01-10-2022 upon evaluation today patient appears to be doing well with regard to his wounds and he is going require some sharp debridement but overall seems to be making good progress. Fortunately I do not see any evidence of active infection locally or systemically at this time which is great news. 01-24-2022 upon evaluation today patient appears to be doing well with regard to his wound. The facility actually came and dropped him off early and he had another appointment at the hospital and then they just brought him over here and this was still hours before his appointment this afternoon. For that reason we did do our best to work him in this morning and fortunately had some space to make this happen. With that being said patient's wound does seem to be making progress here and I am very pleased in that regard I do not see any signs of active infection locally or systemically at this time. 02-07-2022 patient appears to be doing well currently in regard to his  wounds. In fact one of them the more proximal is healed the distal is still open but seems to be doing excellent. Fortunately I do not see any evidence of active infection locally or systemically at this time which is great news. No fevers, chills, nausea, vomiting, or diarrhea. 7/27; left medial ankle venous. Improving per our intake nurse. We are using Hydrofera Blue under Tubigrip compression. They are changing that at his facility 03-06-2022 upon evaluation today patient appears to be doing well with regard to the his wound he is can require some sharp debridement but seems to be making excellent progress. Fortunately I do not see any evidence of active infection locally or systemically which is great news. 03-27-2022 upon evaluation today patient's wound is actually showing signs of excellent improvement. Fortunately I see no signs of active infection locally or systemically at this time which is great news. No fevers, chills, nausea, vomiting, or diarrhea. 04-21-2022 upon evaluation today patient appears to be doing excellent in regard to his wound in fact this is very close to resolution based on what I am seeing. I do not see any evidence of active infection locally or systemically at this time which is great news and overall I am extremely pleased with where we are today. 05-05-2022 upon evaluation today patient's wound actually  appears to potentially be completely healed. Fortunately I do not see any evidence of active infection at this time which is great news and overall I am very pleased I think this needs a little time to toughen up but other than that I really do believe were doing quite well. He is very pleased to hear this its been a long time coming. 05-19-2022 upon evaluation today patient appears to be doing well currently in regard to his wound. He in fact we were hoping will be completely healed and closed but it still has a very tiny area right in the center which is still  continuing to drain. Fortunately I do not see any signs of infection locally or systemically which is great news. 11/6; this wound is close to completely" he comes in this week with some erythema at 1 edge of this which looks like something was rubbing on the area. Other than that no evidence of infection Objective Constitutional Vitals Time Taken: 10:53 AM, Height: 67 in, Weight: 150 lbs, BMI: 23.5, Temperature: 97.9 F, Pulse: 79 bpm, Respiratory Rate: 16 breaths/min, Blood Pressure: 138/87 mmHg. Integumentary (Hair, Skin) Wound #12 status is Open. Original cause of wound was Gradually Appeared. The date acquired was: 07/12/2019. The wound has been in treatment 25 weeks. The wound is located on the Left,Distal,Medial Ankle. The wound measures 0.5cm length x 0.3cm width x 0.1cm depth; 0.118cm^2 area and 0.012cm^3 volume. There is Fat Layer (Subcutaneous Tissue) exposed. There is a medium amount of serosanguineous drainage noted. The wound margin is distinct with the outline attached to the wound base. There is large (67-100%) pink granulation within the wound bed. There is a small (1-33%) amount of necrotic tissue within the wound bed including Adherent Slough. 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 Bathing/ Shower/ Hygiene: Wash wounds with antibacterial soap and water. No tub bath. Anesthetic (Use 'Patient Medications' Section for Anesthetic Order Entry): Lidocaine applied to wound bed Edema Control - Lymphedema / Segmental Compressive Device / Other: Tubigrip single layer applied. - Tubi C left lower  leg Medications-Please add to medication list.: Other: - apply Desitin to periwound WOUND #12: - Ankle Wound Laterality: Left, Medial, Distal Cleanser: Soap and Water 3 x Per Week/30 Days Discharge Instructions: Gently cleanse wound with antibacterial soap, rinse and pat dry prior to dressing wounds Cleanser: Wound Cleanser 3 x Per Week/30 Days Discharge Instructions: Wash your hands with soap and water. Remove old dressing, discard into plastic bag and place into trash. Cleanse the wound with Wound Cleanser prior to applying a clean dressing using gauze sponges, not tissues or cotton balls. Do not scrub or use excessive force. Pat dry using gauze sponges, not tissue or cotton balls. Topical: Desitin Maximum Strength Ointment, 1 (oz) tube 3 x Per Week/30 Days Prim Dressing: Hydrofera Blue Ready Transfer Foam, 2.5x2.5 (in/in) 3 x Per Week/30 Days ary Discharge Instructions: Apply Hydrofera Blue Ready to wound bed as directed Secondary Dressing: ABD Pad 5x9 (in/in) 3 x Per Week/30 Days Discharge Instructions: Cover with ABD pad Secured With: Medipore T - 2M Medipore H Soft Cloth Surgical T ape ape, 2x2 (in/yd) 3 x Per Week/30 Days Secured With: [employer] Sterile or Non-Sterile 6-ply 4.5x4 (yd/yd) 3 x Per Week/30  Days Discharge Instructions: Apply Kerlix as directed Secured With: Tubigrip Size C, 2.75x10 (in/yd) 3 x Per Week/30 Days Discharge Instructions: Apply 3 Tubigrip C 3-finger-widths below knee to base of toes to secure dressing and/or for swelling. This can be hand washed and air dried 1. #1 continue with the same dressing 2. We are going to put some ABD in this area to avoid any excess irritation. 3. May be closed by next week or the next time we see him. Electronic Signature(s) Signed: 06/02/2022 4:14:14 PM By: Linton Ham MD Entered By: Linton Ham on 06/02/2022 11:57:02 -------------------------------------------------------------------------------- SuperBill  Details Patient Name: Date of Service: Chad [name], Chad [name] 06/02/2022 Medical Record Number: [id number] Patient Account Number: [id number] Date of Birth/Sex: Treating RN: March 21, 1961 (60 y.o. [name] Primary Care Provider: [name] Other Clinician: [name] Referring Provider: Treating Provider/Extender: RO BSO Delane Ginger, MICHA EL Haze Justin Weeks in Treatment: 25 Diagnosis Coding ICD-10 Codes Code Description IZEN, PETZ ([id number]) [id number] Page 10 of 10 [phone] Chronic venous hypertension (idiopathic) with ulcer and inflammation of left lower extremity L97.322 Non-pressure chronic ulcer of left ankle with fat layer exposed 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 side Facility Procedures : CPT4 Code: 16244695 Description: 99213 - WOUND CARE VISIT-LEV 3 EST PT Modifier: Quantity: 1 Physician Procedures : CPT4 Code Description Modifier [id number] - WC PHYS LEVEL 3 - EST PT ICD-10 Diagnosis Description 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 (idiopathic) with ulcer and inflammation of left lower extremity L97.322 Non-pressure chronic ulcer of left ankle  with fat layer exposed Quantity: 1 Electronic Signature(s) Signed: 06/02/2022 4:14:14 PM By: Linton Ham MD Entered By: Linton Ham on 06/02/2022 11:57:32

## 2022-06-10 ENCOUNTER — Inpatient Hospital Stay: Payer: Medicaid Other

## 2022-06-10 ENCOUNTER — Inpatient Hospital Stay (HOSPITAL_BASED_OUTPATIENT_CLINIC_OR_DEPARTMENT_OTHER): Payer: Medicaid Other | Admitting: Hospice and Palliative Medicine

## 2022-06-10 ENCOUNTER — Encounter: Payer: Self-pay | Admitting: Hospice and Palliative Medicine

## 2022-06-10 ENCOUNTER — Other Ambulatory Visit: Payer: Self-pay

## 2022-06-10 ENCOUNTER — Encounter: Payer: Self-pay | Admitting: Internal Medicine

## 2022-06-10 ENCOUNTER — Inpatient Hospital Stay: Payer: Medicaid Other | Attending: Internal Medicine | Admitting: Nurse Practitioner

## 2022-06-10 VITALS — BP 102/66 | HR 80 | Temp 97.4°F | Resp 20 | Ht 69.0 in | Wt 162.0 lb

## 2022-06-10 DIAGNOSIS — C661 Malignant neoplasm of right ureter: Secondary | ICD-10-CM | POA: Diagnosis present

## 2022-06-10 DIAGNOSIS — Z66 Do not resuscitate: Secondary | ICD-10-CM | POA: Diagnosis not present

## 2022-06-10 DIAGNOSIS — C187 Malignant neoplasm of sigmoid colon: Secondary | ICD-10-CM | POA: Diagnosis not present

## 2022-06-10 DIAGNOSIS — F17210 Nicotine dependence, cigarettes, uncomplicated: Secondary | ICD-10-CM | POA: Insufficient documentation

## 2022-06-10 DIAGNOSIS — Z515 Encounter for palliative care: Secondary | ICD-10-CM

## 2022-06-10 DIAGNOSIS — Z800 Family history of malignant neoplasm of digestive organs: Secondary | ICD-10-CM | POA: Diagnosis not present

## 2022-06-10 DIAGNOSIS — D509 Iron deficiency anemia, unspecified: Secondary | ICD-10-CM | POA: Insufficient documentation

## 2022-06-10 DIAGNOSIS — Z806 Family history of leukemia: Secondary | ICD-10-CM | POA: Insufficient documentation

## 2022-06-10 DIAGNOSIS — Z5112 Encounter for antineoplastic immunotherapy: Secondary | ICD-10-CM | POA: Diagnosis present

## 2022-06-10 DIAGNOSIS — Z8051 Family history of malignant neoplasm of kidney: Secondary | ICD-10-CM | POA: Diagnosis not present

## 2022-06-10 DIAGNOSIS — Z7189 Other specified counseling: Secondary | ICD-10-CM

## 2022-06-10 DIAGNOSIS — Z79899 Other long term (current) drug therapy: Secondary | ICD-10-CM | POA: Diagnosis not present

## 2022-06-10 DIAGNOSIS — Z95828 Presence of other vascular implants and grafts: Secondary | ICD-10-CM

## 2022-06-10 DIAGNOSIS — D500 Iron deficiency anemia secondary to blood loss (chronic): Secondary | ICD-10-CM

## 2022-06-10 DIAGNOSIS — Z8042 Family history of malignant neoplasm of prostate: Secondary | ICD-10-CM | POA: Diagnosis not present

## 2022-06-10 LAB — COMPREHENSIVE METABOLIC PANEL
ALT: 25 U/L (ref 0–44)
AST: 44 U/L — ABNORMAL HIGH (ref 15–41)
Albumin: 3.2 g/dL — ABNORMAL LOW (ref 3.5–5.0)
Alkaline Phosphatase: 127 U/L — ABNORMAL HIGH (ref 38–126)
Anion gap: 6 (ref 5–15)
BUN: 18 mg/dL (ref 8–23)
CO2: 24 mmol/L (ref 22–32)
Calcium: 8.3 mg/dL — ABNORMAL LOW (ref 8.9–10.3)
Chloride: 105 mmol/L (ref 98–111)
Creatinine, Ser: 1.02 mg/dL (ref 0.61–1.24)
GFR, Estimated: >60 mL/min (ref >60)
Glucose, Bld: 159 mg/dL — ABNORMAL HIGH (ref 70–99)
Potassium: 3.7 mmol/L (ref 3.5–5.1)
Sodium: 135 mmol/L (ref 135–145)
Total Bilirubin: 0.4 mg/dL (ref 0.3–1.2)
Total Protein: 7.2 g/dL (ref 6.5–8.1)

## 2022-06-10 LAB — TSH: TSH: 3.276 u[IU]/mL (ref 0.350–4.500)

## 2022-06-10 LAB — CBC WITH DIFFERENTIAL/PLATELET
Abs Immature Granulocytes: 0.02 10*3/uL (ref 0.00–0.07)
Basophils Absolute: 0.1 10*3/uL (ref 0.0–0.1)
Basophils Relative: 1 %
Eosinophils Absolute: 0.4 10*3/uL (ref 0.0–0.5)
Eosinophils Relative: 6 %
HCT: 38.3 % — ABNORMAL LOW (ref 39.0–52.0)
Hemoglobin: 11.9 g/dL — ABNORMAL LOW (ref 13.0–17.0)
Immature Granulocytes: 0 %
Lymphocytes Relative: 18 %
Lymphs Abs: 1.2 10*3/uL (ref 0.7–4.0)
MCH: 24.2 pg — ABNORMAL LOW (ref 26.0–34.0)
MCHC: 31.1 g/dL (ref 30.0–36.0)
MCV: 78 fL — ABNORMAL LOW (ref 80.0–100.0)
Monocytes Absolute: 0.5 10*3/uL (ref 0.1–1.0)
Monocytes Relative: 8 %
Neutro Abs: 4.4 10*3/uL (ref 1.7–7.7)
Neutrophils Relative %: 67 %
Platelets: 172 10*3/uL (ref 150–400)
RBC: 4.91 MIL/uL (ref 4.22–5.81)
RDW: 17.7 % — ABNORMAL HIGH (ref 11.5–15.5)
WBC: 6.6 10*3/uL (ref 4.0–10.5)
nRBC: 0 % (ref 0.0–0.2)

## 2022-06-10 MED ORDER — SODIUM CHLORIDE 0.9 % IV SOLN
Freq: Once | INTRAVENOUS | Status: AC
  Filled 2022-06-10: qty 250

## 2022-06-10 MED ORDER — HEPARIN SOD (PORK) LOCK FLUSH 100 UNIT/ML IV SOLN
500.0000 [IU] | Freq: Once | INTRAVENOUS | Status: AC | PRN
  Administered 2022-06-10: 500 [IU]
  Filled 2022-06-10: qty 5

## 2022-06-10 MED ORDER — SODIUM CHLORIDE 0.9% FLUSH
10.0000 mL | Freq: Once | INTRAVENOUS | Status: AC
  Administered 2022-06-10: 10 mL via INTRAVENOUS
  Filled 2022-06-10: qty 10

## 2022-06-10 MED ORDER — SODIUM CHLORIDE 0.9% FLUSH
10.0000 mL | INTRAVENOUS | Status: DC | PRN
  Filled 2022-06-10: qty 10

## 2022-06-10 MED ORDER — SODIUM CHLORIDE 0.9 % IV SOLN
200.0000 mg | Freq: Once | INTRAVENOUS | Status: AC
  Administered 2022-06-10: 200 mg via INTRAVENOUS
  Filled 2022-06-10: qty 200

## 2022-06-10 MED ORDER — SODIUM CHLORIDE 0.9 % IV SOLN
200.0000 mg | Freq: Once | INTRAVENOUS | Status: AC
  Administered 2022-06-10: 200 mg via INTRAVENOUS
  Filled 2022-06-10: qty 8

## 2022-06-10 NOTE — Patient Instructions (Signed)
[facility] [place]  Discharge Instructions: Thank you for choosing [facility] to provide your oncology and hematology care.  If you have a lab appointment with the Cancer Center, please go directly to the Cancer Center and check in at the registration area.  Wear comfortable clothing and clothing appropriate for easy access to any Portacath or PICC line.   We strive to give you quality time with your provider. You may need to reschedule your appointment if you arrive late (15 or more minutes).  Arriving late affects you and other patients whose appointments are after yours.  Also, if you miss three or more appointments without notifying the office, you may be dismissed from the clinic at the provider's discretion.      For prescription refill requests, have your pharmacy contact our office and allow 72 hours for refills to be completed.    Today you received the following chemotherapy and/or immunotherapy agents Keytruda      To help prevent nausea and vomiting after your treatment, we encourage you to take your nausea medication as directed.  BELOW ARE SYMPTOMS THAT SHOULD BE REPORTED IMMEDIATELY: *FEVER GREATER THAN 100.4 F (38 C) OR HIGHER *CHILLS OR SWEATING *NAUSEA AND VOMITING THAT IS NOT CONTROLLED WITH YOUR NAUSEA MEDICATION *UNUSUAL SHORTNESS OF BREATH *UNUSUAL BRUISING OR BLEEDING *URINARY PROBLEMS (pain or burning when urinating, or frequent urination) *BOWEL PROBLEMS (unusual diarrhea, constipation, pain near the anus) TENDERNESS IN MOUTH AND THROAT WITH OR WITHOUT PRESENCE OF ULCERS (sore throat, sores in mouth, or a toothache) UNUSUAL RASH, SWELLING OR PAIN  UNUSUAL VAGINAL DISCHARGE OR ITCHING   Items with * indicate a potential emergency and should be followed up as soon as possible or go to the Emergency Department if any problems should occur.  Please show the CHEMOTHERAPY ALERT CARD or IMMUNOTHERAPY ALERT CARD at check-in to  the Emergency Department and triage nurse.  Should you have questions after your visit or need to cancel or reschedule your appointment, please contact [facility]  [phone] and follow the prompts.  Office hours are 8:00 a.m. to 4:30 p.m. Monday - Friday. Please note that voicemails left after 4:00 p.m. may not be returned until the following business day.  We are closed weekends and major holidays. You have access to a nurse at all times for urgent questions. Please call the main number to the clinic [phone] and follow the prompts.  For any non-urgent questions, you may also contact your provider using MyChart. We now offer e-Visits for anyone 18 and older to request care online for non-urgent symptoms. For details visit [email or web address].   Also download the MyChart app! Go to the app store, search "MyChart", open the app, select [facility], and log in with your MyChart username and password.  Masks are optional in the cancer centers. If you would like for your care team to wear a mask while they are taking care of you, please let them know. For doctor visits, patients may have with them one support person who is at least 63 years old. At this time, visitors are not allowed in the infusion area.   

## 2022-06-10 NOTE — Progress Notes (Signed)
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  Is alone/in a wheelchair.  [name] 61 y.o.  male above history of stage IV-ureteral cancer/history of stage III colon cancer right side; recurrent sigmoid colon cancer [un-resected/under surveillance] and multiple other comorbidities currently on Keytruda is here for follow-up. Continues to reside at nursing facility. Denies diarrhea, rash, or cough. Chronic shortness of breath. Not worse. Feels at baseline and denies complaints. Saw  Josh Borders today with palliative care and confirmed DNR status.   Review of Systems  Constitutional:  Positive for malaise/fatigue. Negative for chills, diaphoresis, fever and weight loss.  HENT:  Negative for nosebleeds and sore throat.   Eyes:  Negative for double vision.  Respiratory:  Positive for cough and shortness of breath. Negative for hemoptysis and wheezing.   Cardiovascular:  Negative for chest pain, palpitations and orthopnea.  Gastrointestinal:  Positive for constipation. Negative for abdominal pain, blood in stool, diarrhea, heartburn, melena, nausea and vomiting.  Genitourinary:  Negative for dysuria, frequency and urgency.  Musculoskeletal:  Positive for back pain and joint pain.  Skin: Negative.  Negative for itching and rash.  Neurological:  Positive for focal weakness. Negative for dizziness, tingling, weakness and headaches.       Chronic left-sided weakness upper than lower extremity.  Endo/Heme/Allergies:  Does not bruise/bleed easily.  Psychiatric/Behavioral:  Negative for depression. The patient is not nervous/anxious and does not have insomnia.      MEDICAL HISTORY:  Past Medical History:  Diagnosis Date   Anemia    Anxiety    ARF (acute respiratory failure) (HCC)    Bladder cancer (HCC)    COPD (chronic obstructive pulmonary disease) ([place])    Depression    Dysphagia    Family history of colon cancer    Family history of kidney cancer    Family history of leukemia    Family history of prostate cancer    GERD (gastroesophageal reflux disease)    Hepatitis    chronic hep c   Hydronephrosis    Hydronephrosis with ureteral stricture    Hyperlipidemia    Knee pain  Left   Malignant neoplasm of colon (HCC)    Nerve pain    Peripheral vascular disease (HCC)    Prostate cancer (HCC)    Stroke (HCC)    Ureteral cancer, right ([place])    Urinary frequency    Venous hypertension of both lower extremities     SURGICAL HISTORY: Past Surgical History:   Procedure Laterality Date   COLON SURGERY     En bloc extended right hemicolectomy 07/2017   COLONOSCOPY WITH PROPOFOL N/A 11/06/2020   Procedure: COLONOSCOPY WITH PROPOFOL;  Surgeon: Jonathon Bellows, MD;  Location: [facility] ENDOSCOPY;  Service: Gastroenterology;  Laterality: N/A;   COLONOSCOPY WITH PROPOFOL N/A 07/31/2021   Procedure: COLONOSCOPY WITH PROPOFOL;  Surgeon: Edan Bellow, MD;  Location: [facility] ENDOSCOPY;  Service: Endoscopy;  Laterality: N/A;   CYSTOSCOPY W/ RETROGRADES Right 08/30/2018   Procedure: CYSTOSCOPY WITH RETROGRADE PYELOGRAM;  Surgeon: Hollice Espy, MD;  Location: [facility] ORS;  Service: Urology;  Laterality: Right;   CYSTOSCOPY WITH STENT PLACEMENT Right 04/25/2018   Procedure: CYSTOSCOPY WITH STENT PLACEMENT;  Surgeon: Hollice Espy, MD;  Location: [facility] ORS;  Service: Urology;  Laterality: Right;   CYSTOSCOPY WITH STENT PLACEMENT Right 08/30/2018   Procedure: Santel Exchange;  Surgeon: Hollice Espy, MD;  Location: [facility] ORS;  Service: Urology;  Laterality: Right;   CYSTOSCOPY WITH STENT PLACEMENT Right 03/07/2019   Procedure: CYSTOSCOPY WITH STENT Exchange;  Surgeon: Hollice Espy, MD;  Location: [facility] ORS;  Service: Urology;  Laterality: Right;   CYSTOSCOPY WITH STENT PLACEMENT Right 11/21/2019   Procedure: CYSTOSCOPY WITH STENT Exchange;  Surgeon: Hollice Espy, MD;  Location: [facility] ORS;  Service: Urology;  Laterality: Right;   LOWER EXTREMITY ANGIOGRAPHY Left 05/23/2019   Procedure: LOWER EXTREMITY ANGIOGRAPHY;  Surgeon: Algernon Huxley, MD;  Location: [facility];  Service: Cardiovascular;  Laterality: Left;   LOWER EXTREMITY ANGIOGRAPHY Right 05/30/2019   Procedure: LOWER EXTREMITY ANGIOGRAPHY;  Surgeon: Algernon Huxley, MD;  Location: [facility];  Service: Cardiovascular;  Laterality: Right;   LOWER EXTREMITY ANGIOGRAPHY Right 02/13/2020   Procedure: LOWER EXTREMITY ANGIOGRAPHY;  Surgeon: Algernon Huxley, MD;  Location: [facility];   Service: Cardiovascular;  Laterality: Right;   LOWER EXTREMITY ANGIOGRAPHY Left 02/20/2020   Procedure: LOWER EXTREMITY ANGIOGRAPHY;  Surgeon: Algernon Huxley, MD;  Location: [facility];  Service: Cardiovascular;  Laterality: Left;   PORTA CATH INSERTION N/A 02/28/2019   Procedure: PORTA CATH INSERTION;  Surgeon: Algernon Huxley, MD;  Location: [facility];  Service: Cardiovascular;  Laterality: N/A;   tumor removed       SOCIAL HISTORY: Social History   Socioeconomic History   Marital status: Single    Spouse name: Not on file   Number of children: Not on file   Years of education: Not on file   Highest education level: Not on file  Occupational History   Not on file  Tobacco Use   Smoking status: Every Day    Packs/day: 1.00    Types: Cigarettes   Smokeless tobacco: Never  Vaping Use   Vaping Use: Never used  Substance and Sexual Activity   Alcohol use: Not Currently   Drug use: Not Currently   Sexual activity: Not Currently  Other Topics Concern   Not on file  Social History Narrative    used to live [place]; moved  To [place]- end of April 2019; in Nursing home; 1pp/day; quit alcohol. 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        Family- dad- prostate ca [at 75y]; brother- 60 died of prostate cancer; brother- 38- no cancers [[place]]; sonGerald Stabs [[place]];[name]-32y prostate ca [facility] [place]]; daughter- 71 [[place]]; another daughter 45 [[place]/addict]. will refer genetics counseling. Given MSI- abnormal; highly suspicious of Lynch syndrome.  Patient's son [name] aware of high possible lynch syndrome.   Social Determinants of Health   Financial Resource Strain: Not on file  Food Insecurity: Not on file  Transportation Needs: Not on file  Physical Activity: Not on file  Stress: Not on file  Social Connections: Not on file  Intimate Partner Violence: Not on file    FAMILY HISTORY: Family History  Problem Relation Age of Onset   Prostate cancer Father 86   Cancer Brother 13        unsure type   Cancer Paternal Uncle        unsure type   Cancer Maternal Grandmother        unsure type   Cancer Paternal Grandmother        unsure type   Kidney cancer Paternal Grandfather    Cancer Other        unsure types   Leukemia Son    Cancer Son        other cancers, possibly colon    ALLERGIES:  is allergic to penicillins.  MEDICATIONS:  Current Outpatient Medications  Medication Sig Dispense Refill   acetaminophen (TYLENOL) 325 MG tablet Take 650 mg by mouth 3 (three) times daily.      aspirin EC 81 MG tablet Take 1 tablet (81 mg total) by mouth daily. 150 tablet 2   atorvastatin (LIPITOR) 10 MG tablet Take 1 tablet (10 mg total) by mouth daily. 30 tablet 11   Calcium Carbonate Antacid 600 MG chewable tablet Chew 2 tablets (1,200 mg total) by mouth daily. 90 tablet 3   carboxymethylcellulose 1 % ophthalmic solution Apply 1 drop to eye 2 (two) times daily.     cholecalciferol (VITAMIN D3) 25 MCG (1000 UNIT) tablet Take 1 tablet (1,000 Units total) by mouth daily. 1 tablet 3   cyclobenzaprine (FLEXERIL) 10 MG tablet Take 10 mg by mouth at bedtime.     escitalopram (LEXAPRO) 5 MG tablet Take 5 mg by mouth daily.     gabapentin (NEURONTIN) 100 MG capsule Take 100 mg by mouth 3 (three) times daily.     gentamicin ointment (GARAMYCIN) 0.1 % Apply 1 application. topically 3 (three) times daily.     levothyroxine (SYNTHROID) 25 MCG tablet Take 1 tablet (25 mcg total) by mouth daily before breakfast. 30 tablet 1   mirtazapine (REMERON) 7.5 MG tablet Take 7.5 mg by mouth at bedtime.     Oxycodone HCl 10 MG TABS Take 10 mg by mouth every 6 (six) hours as needed for pain.     pantoprazole (PROTONIX) 40 MG tablet Take 40 mg by mouth daily.     senna-docusate (SENOKOT-S) 8.6-50 MG tablet Take 1 tablet by mouth daily.     tamsulosin (FLOMAX) 0.4 MG CAPS capsule Take 1 capsule (0.4 mg total) by mouth daily after supper. 30 capsule 3   No current facility-administered medications  for this visit.    PHYSICAL EXAMINATION: ECOG PERFORMANCE STATUS: 2 - Symptomatic, <50% confined to bed  Vitals:   06/10/22 1017  BP: 102/66  Pulse: 80  Resp: 20  Temp: (!) 97.4 F (36.3 C)   Filed Weights   06/10/22 1017  Weight:  162 lb (73.5 kg)    Physical Exam Constitutional:      Appearance: He is not ill-appearing.     Comments: Disheveled. In wheelchair. Unaccompanied.   Cardiovascular:     Rate and Rhythm: Normal rate and regular rhythm.     Comments: Clubbing of fingers Pulmonary:     Effort: Pulmonary effort is normal.     Comments: dimished Abdominal:     General: There is distension.     Palpations: Abdomen is soft.     Tenderness: There is no abdominal tenderness. There is no guarding.  Musculoskeletal:        General: No deformity.     Right lower leg: No edema.     Left lower leg: No edema.  Skin:    General: Skin is warm and dry.     Findings: Lesion (lower extremity wounds) present.     Comments: Nicotine stained fingernails  Neurological:     Mental Status: He is alert and oriented to person, place, and time. Mental status is at baseline.  Psychiatric:        Mood and Affect: Mood normal.        Behavior: Behavior normal.    LABORATORY DATA:  I have reviewed the data as listed Lab Results  Component Value Date   WBC 6.6 06/10/2022   HGB 11.9 (L) 06/10/2022   HCT 38.3 (L) 06/10/2022   MCV 78.0 (L) 06/10/2022   PLT 172 06/10/2022   Recent Labs    04/29/22 0843 05/20/22 0913 06/10/22 1002  NA 135 137 135  K 3.7 3.9 3.7  CL 105 109 105  CO2 _0 GLUCOSE 154* 125* 159*  BUN _1 CREATININE 1.04 1.02 1.02  CALCIUM 8.3* 8.0* 8.3*  GFRNONAA >60 >60 >60  PROT 7.4 7.3 7.2  ALBUMIN 3.3* 3.3* 3.2*  AST 31 39 44*  ALT _2 ALKPHOS 112 131* 127*  BILITOT 0.4 0.3 0.4     ASSESSMENT & PLAN:   No problem-specific Assessment & Plan notes found for this encounter.  Ureteral cancer, right ([place]) # High-grade urothelial  cancer/cytology-likely of the right renal pelvis/upper ureter.  On Keytruda.  April 18, 2022.  Radiographically mild progressive disease noted in the proximal right ureter however, overall clinically stable.  Today, clinically stable and tolerating treatment well.  Labs reviewed today and acceptable for continuation of treatment.  Proceed with Keytruda today.  TSH today is pending.  Was normal 05/20/2022.   # Sigmoid colon cancer-history of Lynch syndrome.  Under surveillance.  January 2023-colonoscopy-poor prep/multiple failed attempts.  April 18, 2022 CT scan-focal wall thickening in the proximal sigmoid colon with worsening of pericolonic lymph nodes.  Status post evaluation with Dr. Bary Castilla.  Clinically stable.    # Iron deficient anemia-hemoglobin 11-12.  July 2023 iron studies showed ferritin 13, iron saturation 4%.  Today, hemoglobin 11.9 with persistent microcytosis.  Proceed with Venofer today.   # Hypocalcemia-mild.  Continue calcium and vitamin D supplementation.   # Goals of care-patient met with palliative care, Josh Borders today.  Overall poor prognosis, treatment given with palliative intent.   # Port- functioning appropriately  # leg wounds- managed by wound care  # DISPOSITION: Keytruda and Venofer today 3 weeks-Labs (CBC, CMP), Dr. [name], [name] & venofer- la  All questions were answered. The patient knows to call the clinic with any problems, questions or concerns.    Verlon Au, NP 06/10/2022

## 2022-06-10 NOTE — Progress Notes (Signed)
[place]  [phone] [phone]) [phone]   Name: Chad [name] Date: 06/10/2022 MRN: [id number]  DOB: 1960/08/26  Patient Care Team: Demetrius Charity, MD as PCP - General (Family Medicine) Cammie Sickle, MD as Consulting Physician (Internal Medicine) Bary Castilla Forest Gleason, MD as Consulting Physician (General Surgery)    REASON FOR CONSULTATION: Chad [name] is a 61 y.o. male with multiple medical problems including h/o CVA with residual L. Sided weakness, dysphagia, COPD, Lynch syndrome, history of stage III colon cancer s/p hemicolectomy with recurrent sigmoid colon cancer under surveillance and stage IV ureteral cancer diagnosed in 2019.  Patient has been on [facility] for treatment.  He is felt to have a poor prognosis with incurable ureteral cancer and will be stage III recurrent colon cancer and felt too high risk for surgical intervention.  Patient was referred to palliative care to address goals and manage ongoing symptoms.  SOCIAL HISTORY:    Patient is divorced. He lived in [place] but moved here in 2019. Patient has a son, who lives in [place]. He has four other children in [place] and [place].  Patient is a resident at [facility] for the past 6 years.  ADVANCE DIRECTIVES:  Not on file  CODE STATUS: DNR/DNI  PAST MEDICAL HISTORY: Past Medical History:  Diagnosis Date   Anemia    Anxiety    ARF (acute respiratory failure) (HCC)    Bladder cancer (HCC)    COPD (chronic obstructive pulmonary disease) ([place])    Depression    Dysphagia    Family history of colon cancer    Family history of kidney cancer    Family history of leukemia    Family history of prostate cancer    GERD (gastroesophageal reflux disease)    Hepatitis    chronic hep c   Hydronephrosis    Hydronephrosis with ureteral stricture    Hyperlipidemia    Knee pain    Left   Malignant neoplasm of colon (HCC)    Nerve pain     Peripheral vascular disease ([place])    Prostate cancer ([place])    Stroke ([place])    Ureteral cancer, right ([place])    Urinary frequency    Venous hypertension of both lower extremities     PAST SURGICAL HISTORY:  Past Surgical History:  Procedure Laterality Date   COLON SURGERY     En bloc extended right hemicolectomy 07/2017   COLONOSCOPY WITH PROPOFOL N/A 11/06/2020   Procedure: COLONOSCOPY WITH PROPOFOL;  Surgeon: Jonathon Bellows, MD;  Location: [facility] ENDOSCOPY;  Service: Gastroenterology;  Laterality: N/A;   COLONOSCOPY WITH PROPOFOL N/A 07/31/2021   Procedure: COLONOSCOPY WITH PROPOFOL;  Surgeon: Emil Bellow, MD;  Location: [facility] ENDOSCOPY;  Service: Endoscopy;  Laterality: N/A;   CYSTOSCOPY W/ RETROGRADES Right 08/30/2018   Procedure: CYSTOSCOPY WITH RETROGRADE PYELOGRAM;  Surgeon: Hollice Espy, MD;  Location: [facility] ORS;  Service: Urology;  Laterality: Right;   CYSTOSCOPY WITH STENT PLACEMENT Right 04/25/2018   Procedure: CYSTOSCOPY WITH STENT PLACEMENT;  Surgeon: Hollice Espy, MD;  Location: [facility] ORS;  Service: Urology;  Laterality: Right;   CYSTOSCOPY WITH STENT PLACEMENT Right 08/30/2018   Procedure: [place];  Surgeon: Hollice Espy, MD;  Location: [facility] ORS;  Service: Urology;  Laterality: Right;   CYSTOSCOPY WITH STENT PLACEMENT Right 03/07/2019   Procedure: CYSTOSCOPY WITH STENT [place];  Surgeon: Hollice Espy, MD;  Location: [facility] ORS;  Service: Urology;  Laterality: Right;  CYSTOSCOPY WITH STENT PLACEMENT Right 11/21/2019   Procedure: CYSTOSCOPY WITH STENT [place];  Surgeon: Hollice Espy, MD;  Location: [facility] ORS;  Service: Urology;  Laterality: Right;   LOWER EXTREMITY ANGIOGRAPHY Left 05/23/2019   Procedure: LOWER EXTREMITY ANGIOGRAPHY;  Surgeon: Algernon Huxley, MD;  Location: [place] CV LAB;  Service: Cardiovascular;  Laterality: Left;   LOWER EXTREMITY ANGIOGRAPHY Right 05/30/2019   Procedure: LOWER EXTREMITY ANGIOGRAPHY;  Surgeon: Algernon Huxley,  MD;  Location: [place] CV LAB;  Service: Cardiovascular;  Laterality: Right;   LOWER EXTREMITY ANGIOGRAPHY Right 02/13/2020   Procedure: LOWER EXTREMITY ANGIOGRAPHY;  Surgeon: Algernon Huxley, MD;  Location: [place] CV LAB;  Service: Cardiovascular;  Laterality: Right;   LOWER EXTREMITY ANGIOGRAPHY Left 02/20/2020   Procedure: LOWER EXTREMITY ANGIOGRAPHY;  Surgeon: Algernon Huxley, MD;  Location: [place] CV LAB;  Service: Cardiovascular;  Laterality: Left;   PORTA CATH INSERTION N/A 02/28/2019   Procedure: PORTA CATH INSERTION;  Surgeon: Algernon Huxley, MD;  Location: [place] CV LAB;  Service: Cardiovascular;  Laterality: N/A;   tumor removed       HEMATOLOGY/ONCOLOGY HISTORY:  Oncology History Overview Note  # SEP-OCT 2019-right renal pelvis/ ureteral [cytology positive HIGH grade urothelial carcinoma [Dr.Brandon]    # NOV 24th 2019-Keytruda [consent]  # April 2022- colonoscopy [Dr.Anna;incidental PET- sigmoid uptake] ~25 mm polypoid lesion-biopsy mucinous carcinoma- s/p Dr.Byrnett [May 2022]-repeat colonoscopy in 6 months [multiple comorbidities]  #Right ureteral obstruction status post stent placement  # JAN 2019- Right Colon ca [ T4N1]  [[facility] NO adjuvant therapy  # Hep C/ # stroke of left side/weakness-2018 Nov [[facility]]; active smoker  DIAGNOSIS: # 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; # Colon ca- stage III  GOALS: palliative  CURRENT/MOST RECENT THERAPY: Keytruda [C]    Urothelial cancer ([place])   Initial Diagnosis   Urothelial cancer ([place])   Ureteral cancer, right ([place])  05/26/2018 Initial Diagnosis   Ureteral cancer, right ([place])   06/15/2018 -  Chemotherapy   Patient is on Treatment Plan : BLADDER Pembrolizumab (200) q21d     06/21/2018 - 03/17/2022 Chemotherapy   Patient is on Treatment Plan : urothelial cancer- pembrolizumab q21d       ALLERGIES:  is allergic to penicillins.  MEDICATIONS:  Current Outpatient Medications  Medication Sig Dispense Refill    acetaminophen (TYLENOL) 325 MG tablet Take 650 mg by mouth 3 (three) times daily.      aspirin EC 81 MG tablet Take 1 tablet (81 mg total) by mouth daily. 150 tablet 2   atorvastatin (LIPITOR) 10 MG tablet Take 1 tablet (10 mg total) by mouth daily. 30 tablet 11   Calcium Carbonate Antacid 600 MG chewable tablet Chew 2 tablets (1,200 mg total) by mouth daily. 90 tablet 3   carboxymethylcellulose 1 % ophthalmic solution Apply 1 drop to eye 2 (two) times daily.     cholecalciferol (VITAMIN D3) 25 MCG (1000 UNIT) tablet Take 1 tablet (1,000 Units total) by mouth daily. 1 tablet 3   clopidogrel (PLAVIX) 75 MG tablet Take 1 tablet (75 mg total) by mouth daily. 30 tablet 11   cyclobenzaprine (FLEXERIL) 10 MG tablet Take 10 mg by mouth at bedtime.     escitalopram (LEXAPRO) 5 MG tablet Take 5 mg by mouth daily.     gabapentin (NEURONTIN) 100 MG capsule Take 100 mg by mouth 3 (three) times daily.     gentamicin ointment (GARAMYCIN) 0.1 % Apply 1 application. topically 3 (three) times  daily.     levothyroxine (SYNTHROID) 25 MCG tablet Take 1 tablet (25 mcg total) by mouth daily before breakfast. 30 tablet 1   levothyroxine (SYNTHROID) 25 MCG tablet Take 1 tablet (25 mcg total) by mouth daily before breakfast. 30 tablet 1   mirtazapine (REMERON) 7.5 MG tablet Take 7.5 mg by mouth at bedtime.     Oxycodone HCl 10 MG TABS Take 10 mg by mouth every 6 (six) hours as needed for pain.     pantoprazole (PROTONIX) 40 MG tablet Take 40 mg by mouth daily.     senna-docusate (SENOKOT-S) 8.6-50 MG tablet Take 1 tablet by mouth daily.     tamsulosin (FLOMAX) 0.4 MG CAPS capsule Take 1 capsule (0.4 mg total) by mouth daily after supper. 30 capsule 3   No current facility-administered medications for this visit.    VITAL SIGNS: There were no vitals taken for this visit. There were no vitals filed for this visit.   Estimated body mass index is 24.28 kg/m as calculated from the following:   Height as of 04/08/22: 5'  9" (1.753 m).   Weight as of 04/29/22: 164 lb 6.4 oz (74.6 kg).  LABS: CBC:    Component Value Date/Time   WBC 6.1 05/20/2022 0913   HGB 11.9 (L) 05/20/2022 0913   HCT 38.7 (L) 05/20/2022 0913   PLT 162 05/20/2022 0913   MCV 78.5 (L) 05/20/2022 0913   NEUTROABS 3.7 05/20/2022 0913   LYMPHSABS 1.3 05/20/2022 0913   MONOABS 0.6 05/20/2022 0913   EOSABS 0.5 05/20/2022 0913   BASOSABS 0.1 05/20/2022 0913   Comprehensive Metabolic Panel:    Component Value Date/Time   NA 137 05/20/2022 0913   K 3.9 05/20/2022 0913   CL 109 05/20/2022 0913   CO2 24 05/20/2022 0913   BUN 16 05/20/2022 0913   CREATININE 1.02 05/20/2022 0913   GLUCOSE 125 (H) 05/20/2022 0913   CALCIUM 8.0 (L) 05/20/2022 0913   AST 39 05/20/2022 0913   ALT 23 05/20/2022 0913   ALKPHOS 131 (H) 05/20/2022 0913   BILITOT 0.3 05/20/2022 0913   PROT 7.3 05/20/2022 0913   ALBUMIN 3.3 (L) 05/20/2022 0913    RADIOGRAPHIC STUDIES: No results found.  PERFORMANCE STATUS (ECOG) : 3 - Symptomatic, >50% confined to bed  Review of Systems As noted above. Otherwise, a complete review of systems is negative.  Physical Exam General: NAD, frail appearing, thin Cardiovascular: regular rate and rhythm Pulmonary: clear ant fields Abdomen: soft, nontender, + bowel sounds Extremities: no edema, L. Knee brace Skin: no rashes Neurological: L. Sided Weakness but otherwise nonfocal  IMPRESSION: Patient was previously seen by me in clinic in 2019 but was subsequently lost to follow-up.  He has most recently been followed by medical oncology with Dr. [name] for stage IV ureteral cancer on Keytruda and also with recurrent (at least stage III) colorectal cancer and patient is not felt to be a viable surgical candidate.  Prognosis is felt to be poor and so patient was referred back to palliative care to address goals.  I had previously completed a MOST form with patient back in 2019.  At that time, patient related a desire not to  have CPR or cardiac resuscitation but would have been okay with short-term intubation if needed.  Today, patient states that he would not want to be resuscitated nor have his life prolonged artificially on machines.  He is in full agreement with DNR/DNI and states that he has communicated those wishes  to SNF staff.  I will send him back with a new DNR in case there needs to be an updated one placed in his chart.  Patient states that he knows he has stage IV cancer but was unsure the significance of that.  We talked about stage IV cancer being inherently incurable and that eventually the cancer may be the cause of his ultimate demise.  Patient states that he has talked with his son about funeral plans and accepts that he has a terminal cancer.  However, he is in agreement with current scope of treatment.  Symptomatically, patient states that he is doing reasonably well and denies significant symptomatic burden at present.  PLAN: -Continue current scope of treatment -DNR/DNI -Referral to community palliative care to follow at SNF -RTC 3 to 4 weeks   Patient expressed understanding and was in agreement with this plan. He also understands that He can call clinic at any time with any questions, concerns, or complaints.    Time Total: 20 minutes  Visit consisted of counseling and education dealing with the complex and emotionally intense issues of symptom management and palliative care in the setting of serious and potentially life-threatening illness.Greater than 50%  of this time was spent counseling and coordinating care related to the above assessment and plan.  Signed by: Altha Harm, PhD, NP-C [phone] (Work Cell)

## 2022-06-12 LAB — T4: T4, Total: 10.1 ug/dL (ref 4.5–12.0)

## 2022-06-13 ENCOUNTER — Non-Acute Institutional Stay: Payer: Medicaid Other

## 2022-06-13 VITALS — BP 90/58 | HR 81 | Temp 98.0°F

## 2022-06-13 DIAGNOSIS — Z515 Encounter for palliative care: Secondary | ICD-10-CM

## 2022-06-13 NOTE — Progress Notes (Signed)
PATIENT NAME: Teagen Mcleary DOB: February 14, 1961 MRN: [id number]  PRIMARY CARE PROVIDER: Demetrius Charity, MD  RESPONSIBLE PARTY:  Acct ID - Guarantor Home Phone Work Phone Relationship Acct Type  [id number] Dillon [phone]  Self P/F     [address], [place], [place] [address]   Appetite:  Patient endorses a good appetite.  Currently finishing breakfast with 100% intake.  Patient states he is eating 3 meals day at the facility.  He has various snacks in the room a friend brings to him.  Most recent weight 162 lbs as 06/10/22.  Skin:  left ankle wound healing per patient.  He continues to be followed at the wound clinic.  Pain:  Patient endorses good pain management.  No pain voiced on this visit.  Cancer Dx:  Patient states he is understanding of his cancer dx and has no concerns or needs at this time.   Spoke with patient's aide Izora Gala who advised patient has been doing well.  No new concerns voiced.  Update provided to Christin Gusler, NP who will continue to follow for Palliative Care.    CODE STATUS: DNR ADVANCED DIRECTIVES: No MOST FORM: No PPS: 50%   PHYSICAL EXAM:   VITALS: Today's Vitals   06/13/22 0828  BP: (!) 90/58  Pulse: 81  Temp: 98 F (36.7 C)  SpO2: (!) 89%    LUNGS: decreased breath sounds CARDIAC: Cor RRR}  SKIN: Skin color, texture, turgor normal. No rashes or lesions or left ankle wound-continues to be followed at the wound clinic   NEURO: positive for gait problems       Lorenza Burton, RN

## 2022-06-16 ENCOUNTER — Encounter: Payer: Medicaid Other | Admitting: Physician Assistant

## 2022-06-16 DIAGNOSIS — I87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 hypertension (idiopathic) with ulcer and inflammation of left lower extremity: Secondary | ICD-10-CM | POA: Diagnosis not present

## 2022-06-16 NOTE — Progress Notes (Signed)
CHAO, BLAZEJEWSKI ([id number]) [id number] Page 1 of 2 Visit Report for 06/16/2022 Chief Complaint Document Details Patient Name: Date of Service: Chad [name], Chad [name] 06/16/2022 10:30 A M Medical Record Number: [id number] Patient Account Number: [id number] Date of Birth/Sex: Treating RN: Nov 27, 1960 (61 y.o. [name] Primary Care Provider: [name] Other Clinician: [name] Referring Provider: Treating Provider/Extender: [name] in Treatment: 27 Information Obtained from: Patient Chief Complaint Left ankle ulcers Electronic Signature(s) Signed: 06/16/2022 10:18:15 AM By: Worthy Keeler PA-C Entered By: Worthy Keeler on 06/16/2022 10:18:15 -------------------------------------------------------------------------------- Problem List Details Patient Name: Date of Service: [name], [name] 06/16/2022 10:30 A M Medical Record Number: [id number] Patient Account Number: [id number] Date of Birth/Sex: Treating RN: 1961/03/19 (61 y.o. 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

## 2022-06-16 NOTE — Progress Notes (Signed)
Chad [name], Chad [name] ([id number]) [id number] Page 1 of 9 Visit Report for 06/16/2022 Arrival Information Details Patient Name: Date of Service: Chad [name], Chad [name] 06/16/2022 10:30 A M Medical Record Number: [id number] Patient Account Number: [id number] Date of Birth/Sex: Treating RN: [birth year] (61 y.o. [name], [name] Primary Care Modean Mccullum: [name] Other Clinician: [name] Referring Lillar Bianca: Treating Gordon Carlson/Extender: [name] in Treatment: 27 Visit Information History Since Last Visit All ordered tests and consults were completed: No Patient Arrived: Wheel Chair Added or deleted any medications: No Arrival Time: 10:24 Any new allergies or adverse reactions: No Transfer Assistance: EasyPivot Patient Lift Had a fall or experienced change in No Patient Identification Verified: Yes activities of daily living that may affect Secondary Verification Process Completed: Yes risk of falls: Patient Requires Transmission-Based No Signs or symptoms of abuse/neglect since last visito No Precautions: Hospitalized since last visit: No Patient Has Alerts: Yes Implantable device outside of the clinic excluding No Patient Alerts: Patient on Blood Thinner cellular tissue based products placed in the center NOT diabetic since last visit: aspirin 53m Has Dressing in Place as Prescribed: [facility]: No Electronic Signature(s) Signed: 06/17/2022 5:00:42 PM By: VMassie KluverEntered By: VMassie Kluveron 06/16/2022 10:25:29 -------------------------------------------------------------------------------- Clinic Level of Care Assessment Details Patient Name: Date of Service: LKARRY, CAUSER11/20/2023 10:30 A M Medical Record Number: [id number] Account Number: [id number] of Birth/Sex: Treating RN: 308/14/1962(61y.o. MVerl BlalockPrimary Care Garvis Downum: LErik ObeyOther Clinician:  VMassie KluverReferring Jakaiden Fill: Treating Jerran Tappan/Extender: SCaroline SaugerWeeks in Treatment: 27 Clinic Level of Care Assessment Items TOOL 4 Quantity Score [] - 0 Use when only an EandM is performed on FOLLOW-UP visit ASSESSMENTS - Nursing Assessment / Reassessment X- 1 10 Reassessment of Co-morbidities (includes updates in patient status) X- 1 5 Reassessment of Adherence to Treatment Plan LGANESH, DEEG([id number] Page 2 of 9 ASSESSMENTS - Wound and Skin A ssessment / Reassessment X - Simple Wound Assessment / Reassessment - one wound 1 5 [] - 0 Complex Wound Assessment / Reassessment - multiple wounds [] - 0 Dermatologic / Skin Assessment (not related to wound area) ASSESSMENTS - Focused Assessment [] - 0 Circumferential Edema Measurements - multi extremities [] - 0 Nutritional Assessment / Counseling / Intervention [] - 0 Lower Extremity Assessment (monofilament, tuning fork, pulses) [] - 0 Peripheral Arterial Disease Assessment (using hand held doppler) ASSESSMENTS - Ostomy and/or Continence Assessment and Care [] - 0 Incontinence Assessment and Management [] - 0 Ostomy Care Assessment and Management (repouching, etc.) PROCESS - Coordination of Care X - Simple Patient / Family Education for ongoing care 1 15 [] - 0 Complex (extensive) Patient / Family Education for ongoing care [] - 0 Staff obtains CProgrammer, systems Records, T Results / Process Orders est [] - 0 Staff telephones HHA, Nursing Homes / Clarify orders / etc [] - 0 Routine Transfer to another Facility (non-emergent condition) [] - 0 Routine Hospital Admission (non-emergent condition) [] - 0 New Admissions / IBiomedical engineer/ Ordering NPWT Apligraf, etc. , [] - 0 Emergency Hospital Admission (emergent condition) X- 1 10 Simple Discharge Coordination [] - 0 Complex (extensive) Discharge Coordination PROCESS - Special Needs [] -  0 Pediatric / Minor Patient Management [] - 0 Isolation Patient Management [] - 0 Hearing / Language / Visual special needs [] - 0 Assessment of Community assistance (transportation, D/C planning, etc.) [] - 0 Additional assistance / Altered  mentation [] - 0 Support Surface(s) Assessment (bed, cushion, seat, etc.) INTERVENTIONS - Wound Cleansing / Measurement X - Simple Wound Cleansing - one wound 1 5 [] - 0 Complex Wound Cleansing - multiple wounds X- 1 5 Wound Imaging (photographs - any number of wounds) [] - 0 Wound Tracing (instead of photographs) X- 1 5 Simple Wound Measurement - one wound [] - 0 Complex Wound Measurement - multiple wounds INTERVENTIONS - Wound Dressings [] - 0 Small Wound Dressing one or multiple wounds X- 1 15 Medium Wound Dressing one or multiple wounds [] - 0 Large Wound Dressing one or multiple wounds [] - 0 Application of Medications - topical [] - 0 Application of Medications - injection INTERVENTIONS - Miscellaneous [] - 0 External ear exam Chad [name] ([id number]) [id number] Page 3 of 9 [] - 0 Specimen Collection (cultures, biopsies, blood, body fluids, etc.) [] - 0 Specimen(s) / Culture(s) sent or taken to Lab for analysis [] - 0 Patient Transfer (multiple staff / Harrel Lemon Lift / Similar devices) [] - 0 Simple Staple / Suture removal (25 or less) [] - 0 Complex Staple / Suture removal (26 or more) [] - 0 Hypo / Hyperglycemic Management (close monitor of Blood Glucose) [] - 0 Ankle / Brachial Index (ABI) - do not check if billed separately X- 1 5 Vital Signs Has the patient been seen at the hospital within the last three years: Yes Total Score: 80 Level Of Care: New/Established - Level 3 Electronic Signature(s) Signed: 06/17/2022 5:00:42 PM By: [name] Entered By: [name] on 06/16/2022 10:56:15 -------------------------------------------------------------------------------- Encounter  Discharge Information Details Patient Name: Date of Service: Chad [name] 06/16/2022 10:30 A M Medical Record Number: [id number] Patient Account Number: [id number] Date of Birth/Sex: Treating RN: Apr 16, 1961 (61 y.o. [name] Primary Care Alexey Rhoads: [name] Other Clinician: [name] Referring Zoiee Wimmer: Treating Margan Elias/Extender: [name] in Treatment: 27 Encounter Discharge Information Items Discharge Condition: Stable Ambulatory Status: Wheelchair Discharge Destination: Home Transportation: Other Accompanied By: self Schedule Follow-up Appointment: Yes Clinical Summary of Care: Electronic Signature(s) Signed: 06/17/2022 5:00:42 PM By: [name] Entered By: [name] on 06/16/2022 11:10:03 Lower Extremity Assessment Details -------------------------------------------------------------------------------- Chad [name] ([id number]) [id number] Page 4 of 9 Patient Name: Date of Service: Chad, [name] 06/16/2022 10:30 A M Medical Record Number: [id number] Patient Account Number: [id number] Date of Birth/Sex: Treating RN: 09-19-1960 (61 y.o. [name] Primary Care Kanai Berrios: [name] Other Clinician: [name] Referring Nikitta Sobiech: Treating Minal Stuller/Extender: [name] in Treatment: 27 Electronic Signature(s) Signed: 06/16/2022 1:46:28 PM By: Gretta Cool BSN, RN, CWS, Kim RN, BSN Signed: 06/17/2022 5:00:42 PM By: [name] Entered By: [name] on 06/16/2022 10:53:48 -------------------------------------------------------------------------------- Multi Wound Chart Details Patient Name: Date of Service: Chad, [name] 06/16/2022 10:30 A M Medical Record Number: [id number] Patient Account Number: [id number] Date of Birth/Sex: Treating RN: 19-Sep-1960 (61 y.o. [name] Primary Care Taralyn Ferraiolo: [name] Other Clinician: [name] Referring  Milam Allbaugh: Treating Benedict Kue/Extender: [name] in Treatment: 27 Vital Signs Height(in): 67 Pulse(bpm): 19 Weight(lbs): 150 Blood Pressure(mmHg): 146/87 Body Mass Index(BMI): 22.1 Temperature(F): 98.0 Respiratory Rate(breaths/min): 16 [12:Photos:] [N/A:N/A] Left, Distal, Medial Ankle N/A N/A Wound Location: Gradually Appeared N/A N/A Wounding Event: Venous Leg Ulcer N/A N/A Primary Etiology: Anemia, Chronic Obstructive N/A N/A Comorbid History: Pulmonary Disease (COPD), Coronary Artery Disease, Peripheral Arterial Disease, Peripheral Venous Disease, Hepatitis C, Osteoarthritis, Neuropathy, Received Chemotherapy 07/12/2019 N/A N/A Date Acquired: 23 N/A N/A Weeks of  Treatment: Open N/A N/A Wound Status: No N/A N/A Wound Recurrence: 0.2x0.1x0.1 N/A N/A Measurements L x W x D (cm) 0.016 N/A N/A A (cm) : rea 0.002 N/A N/A Volume (cm) : 99.80% N/A N/A % Reduction in Area: 99.90% N/A N/A % Reduction in Volume: Full Thickness Without Exposed N/A N/A Classification: Support Structures Medium N/A N/A Exudate Amount: Serosanguineous N/A N/A Exudate Type: red, brown N/A N/A Exudate Color: Distinct, outline attached N/A N/A Wound MarginCEYLON, ARENSON ([id number]) [id number] Page 5 of 9 Large (67-100%) N/A N/A Granulation Amount: Pink N/A N/A Granulation Quality: Small (1-33%) N/A N/A Necrotic Amount: Fat Layer (Subcutaneous Tissue): Yes N/A N/A Exposed Structures: Fascia: No Tendon: No Muscle: No Joint: No Bone: No Medium (34-66%) N/A N/A Epithelialization: Treatment Notes Electronic Signature(s) Signed: 06/17/2022 5:00:42 PM By: [name] Entered By: [name] on 06/16/2022 10:54:01 -------------------------------------------------------------------------------- [place] Details Patient Name: Date of Service: Chad [name] 06/16/2022 10:30 A M Medical Record  Number: [id number] Patient Account Number: [id number] Date of Birth/Sex: Treating RN: May 20, 1961 (61 y.o. [name] Primary Care Mayetta Castleman: [name] Other Clinician: [name] Referring Vinnie Bobst: Treating Westley Blass/Extender: [name] in Treatment: 27 Active Inactive Venous Leg Ulcer Nursing Diagnoses: Knowledge deficit related to disease process and management Goals: Patient will maintain optimal edema control Date Initiated: 12/06/2021 Date Inactivated: 12/27/2021 Target Resolution Date: 01/03/2022 Goal Status: Met Patient/caregiver will verbalize understanding of disease process and disease management Date Initiated: 12/06/2021 Target Resolution Date: 05/29/2022 Goal Status: Active Interventions: Assess peripheral edema status every visit. Compression as ordered Notes: Electronic Signature(s) Signed: 06/16/2022 1:46:28 PM By: Gretta Cool, BSN, RN, CWS, Kim RN, BSN Signed: 06/17/2022 5:00:42 PM By: [name] Entered By: [name] on 06/16/2022 10:53:53 Chad [name] ([id number]) [id number] Page 6 of 9 -------------------------------------------------------------------------------- Pain Assessment Details Patient Name: Date of Service: Chad, [name] 06/16/2022 10:30 A M Medical Record Number: [id number] Patient Account Number: [id number] Date of Birth/Sex: Treating RN: Mar 13, 1961 (61 y.o. [name] Primary Care Kendell Sagraves: [name] Other Clinician: [name] Referring Mellany Dinsmore: Treating Ree Alcalde/Extender: [name] in Treatment: 27 Active Problems Location of Pain Severity and Description of Pain Patient Has Paino No Site Locations Pain Management and Medication Current Pain Management: Electronic Signature(s) Signed: 06/16/2022 1:46:28 PM By: Gretta Cool, BSN, RN, CWS, Kim RN, BSN Signed: 06/17/2022 5:00:42 PM By: [name] Entered By: [name] on 06/16/2022  10:28:21 -------------------------------------------------------------------------------- Patient/Caregiver Education Details Patient Name: Date of Service: Chad [name] 11/20/2023andnbsp10:30 A M Medical Record Number: [id number] Patient Account Number: [id number] Date of Birth/Gender: Treating RN: 02/05/1961 (61 y.o. [name] Primary Care Physician: [name] Other Clinician: [name] Referring Physician: Treating Physician/Extender: [name] in Treatment: [address] [place] ([id number]) [id number] Page 7 of 9 Education Assessment Education Provided To: Patient Education Topics Provided Wound/Skin Impairment: Handouts: Other: continue wound care as directed Methods: Explain/Verbal Responses: State content correctly Electronic Signature(s) Signed: 06/17/2022 5:00:42 PM By: [name] Entered By: [name] on 06/16/2022 11:09:28 -------------------------------------------------------------------------------- Wound Assessment Details Patient Name: Date of Service: Chad, [name] 06/16/2022 10:30 A M Medical Record Number: [id number] Patient Account Number: [id number] Date of Birth/Sex: Treating RN: August 02, 1960 (61 y.o. [name] Primary Care Wenzel Backlund: [name] Other Clinician: [name] Referring Reis Pienta: Treating Yarithza Mink/Extender: [name] in Treatment: 27 Wound Status Wound Number: 12 Primary Venous Leg Ulcer Etiology: Wound Location: Left, Distal, Medial Ankle Wound Open Wounding Event: Gradually Appeared Status: Date Acquired: 07/12/2019  Comorbid Anemia, Chronic Obstructive Pulmonary Disease (COPD), Coronary Weeks Of Treatment: 27 History: Artery Disease, Peripheral Arterial Disease, Peripheral Venous Clustered Wound: No Disease, Hepatitis C, Osteoarthritis, Neuropathy, Received Chemotherapy Photos Wound Measurements Length: (cm) Width:  (cm) Depth: (cm) Area: (cm) Volume: (cm) 0.2 % Reduction in Area: 99.8% 0.1 % Reduction in Volume: 99.9% 0.1 Epithelialization: Medium (34-66%) 0.016 Tunneling: No 0.002 Undermining: No Wound Description Classification: Full Thickness Without Exposed Support Structures Wound Margin: Distinct, outline attached Selover, Tayden ([id number]) Exudate Amount: Medium Exudate Type: Serosanguineous Exudate Color: red, brown Foul Odor After Cleansing: No Slough/Fibrino Yes [id number] Page 8 of 9 Wound Bed Granulation Amount: Large (67-100%) Exposed Structure Granulation Quality: Pink Fascia Exposed: No Necrotic Amount: Small (1-33%) Fat Layer (Subcutaneous Tissue) Exposed: Yes Necrotic Quality: Adherent Slough Tendon Exposed: No Muscle Exposed: No Joint Exposed: No Bone Exposed: No Treatment Notes Wound #12 (Ankle) Wound Laterality: Left, Medial, Distal Cleanser Soap and Water Discharge Instruction: Gently cleanse wound with antibacterial soap, rinse and pat dry prior to dressing wounds Wound Cleanser Discharge Instruction: Wash your hands with soap and water. Remove old dressing, discard into plastic bag and place into trash. Cleanse the wound with Wound Cleanser prior to applying a clean dressing using gauze sponges, not tissues or cotton balls. Do not scrub or use excessive force. Pat dry using gauze sponges, not tissue or cotton balls. Peri-Wound Care Topical Desitin Maximum Strength Ointment, 1 (oz) tube Primary Dressing Hydrofera Blue Ready Transfer Foam, 2.5x2.5 (in/in) Discharge Instruction: Apply Hydrofera Blue Ready to wound bed as directed Secondary Dressing ABD Pad 5x9 (in/in) Discharge Instruction: Cover with ABD pad Secured With Medipore T - 1M Medipore H Soft Cloth Surgical T ape ape, 2x2 (in/yd) Kerlix Roll Sterile or Non-Sterile 6-ply 4.5x4 (yd/yd) Discharge Instruction: Apply Kerlix as directed Tubigrip Size C, 2.75x10  (in/yd) Discharge Instruction: Apply 3 Tubigrip C 3-finger-widths below knee to base of toes to secure dressing and/or for swelling. This can be hand washed and air dried Compression Wrap Compression Stockings Add-Ons Electronic Signature(s) Signed: 06/16/2022 1:46:28 PM By: Gretta Cool, BSN, RN, CWS, Kim RN, BSN Signed: 06/17/2022 5:00:42 PM By: [name] Entered By: [name] on 06/16/2022 10:34:11 -------------------------------------------------------------------------------- Vitals Details Patient Name: Date of Service: Chad [name] 06/16/2022 10:30 A M Medical Record Number: [id number] Patient Account Number: [id number] Date of Birth/Sex: Treating RN: [birth year] (61 y.o. [name] [place], [place] ([id number]) [id number] Page 9 of 9 Primary Care : [name] Other Clinician: [name] Referring : Treating /Extender: [name] in Treatment: 27 Vital Signs Time Taken: 10:25 Temperature (F): 98.0 Height (in): 69 Pulse (bpm): 82 Weight (lbs): 150 Respiratory Rate (breaths/min): 16 Body Mass Index (BMI): 22.1 Blood Pressure (mmHg): 146/87 Reference Range: 80 - 120 mg / dl Electronic Signature(s) Signed: 06/17/2022 5:00:42 PM By: [name] Entered By: [name] on 06/16/2022 10:28:16

## 2022-06-30 ENCOUNTER — Encounter: Payer: Medicaid Other | Attending: Physician Assistant | Admitting: Physician Assistant

## 2022-06-30 DIAGNOSIS — L97322 Non-pressure chronic ulcer of left ankle with fat layer exposed: Secondary | ICD-10-CM | POA: Insufficient documentation

## 2022-06-30 DIAGNOSIS — B1920 Unspecified viral hepatitis C without hepatic coma: Secondary | ICD-10-CM | POA: Diagnosis not present

## 2022-06-30 DIAGNOSIS — I69354 Hemiplegia and hemiparesis following cerebral infarction affecting left non-dominant side: Secondary | ICD-10-CM | POA: Diagnosis not present

## 2022-06-30 DIAGNOSIS — I739 Peripheral vascular disease, unspecified: Secondary | ICD-10-CM | POA: Diagnosis not present

## 2022-06-30 DIAGNOSIS — J449 Chronic obstructive pulmonary disease, unspecified: Secondary | ICD-10-CM | POA: Diagnosis not present

## 2022-06-30 DIAGNOSIS — M1990 Unspecified osteoarthritis, unspecified site: Secondary | ICD-10-CM | POA: Diagnosis not present

## 2022-06-30 DIAGNOSIS — I87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 hypertension (idiopathic) with ulcer and inflammation of left lower extremity: Secondary | ICD-10-CM | POA: Insufficient documentation

## 2022-06-30 NOTE — Progress Notes (Addendum)
WALKER, SITAR ([id number]) [id number] Page 1 of 11 Visit Report for 06/30/2022 Chief Complaint Document Details Patient Name: Date of Service: Chad [name], Chad [name] 06/30/2022 9:45 A M Medical Record Number: [id number] Patient Account Number: [id number] Date of Birth/Sex: Treating RN: 20-Aug-1960 (61 y.o. [name] Primary Care Provider: [name] Other Clinician: [name] Referring Provider: Treating Provider/Extender: [name] in Treatment: 29 Information Obtained from: Patient Chief Complaint Left ankle ulcers Electronic Signature(s) Signed: 06/30/2022 9:59:51 AM By: Worthy Keeler PA-C Entered By: Worthy Keeler on 06/30/2022 09:59:51 -------------------------------------------------------------------------------- HPI Details Patient Name: Date of Service: Chad [name] 06/30/2022 9:45 A M Medical Record Number: [id number] Patient Account Number: [id number] Date of Birth/Sex: Treating RN: Jun 13, 1961 (61 y.o. [name] Primary Care Provider: [name] Other Clinician: [name] Referring Provider: Treating Provider/Extender: [name] in Treatment: 29 History of Present Illness HPI Description: 10/08/18 on evaluation today patient actually presents to our office for initial evaluation concerning wounds that he has of the bilateral lower extremities. 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 He also has bilateral venous stasis. He apparently has been homeless currently following discharge from the hospital apparently he has been placed at [facility] which is is a skilled nursing facility locally. Nonetheless fortunately he does not show any signs of infection at this time which is good news. In fact several of the wound  actually appears to be showing some signs of improvement already in my pinion. There are a couple areas in the left leg in particular there likely gonna require some sharp debridement to help clear away some necrotic tissue and help with more sufficient healing. No fevers, chills, nausea, or vomiting noted at this time. 10/15/18 on evaluation today patient actually appears to be doing very well in regard to his bilateral lower extremities. He's been tolerating the dressing changes without complication. Fortunately there does not appear to be any evidence of active infection at this time which is great news. Overall I'm actually very pleased with how this has progressed in just one visits time. Readmission: 08/14/2020 upon evaluation today patient presents for re-evaluation here in our clinic. He is having issues with his left ankle region as well as his right toe and his right heel. He tells me that the toe and heel actually began as a area that was itching that he was scratching and then subsequently opened up into wounds. These may have been abscess areas I presume based on what I am seeing currently. With regard to his left ankle region he tells me this was a similar type DIA, JEFFERYS ([id number]) [id number] Page 2 of 11 occurrence although he does have venous stasis this very well may be more of a venous leg ulcer more than anything. Nonetheless I do believe that the patient would benefit from appropriate and aggressive wound care to try to help get things under better control here. He does have history of a stroke on the left side affecting him to some degree there that he is able to stand although he does have some residual weakness. Otherwise again the patient does have chronic venous insufficiency as previously noted. His arterial studies most recently obtained showed that he had an ABI on the right of 1.16 with a TBI of 0.52 and on the left and ABI of 1.14 with  a  TBI of 0.81. That was obtained on 06/19/2020. 08/28/2020 upon evaluation today patient appears to be doing decently well in regard to his wounds in general. He has been tolerating the dressing changes without complication. Fortunately there does not appear to be any signs of active infection which is great news. With that being said I think the [facility] is doing a good job I would recommend that we likely continue with that currently. 09/11/2020 upon evaluation today patient's wounds did not appear to be doing too poorly but again he is not really showing signs of significant improvement with regard to any of the wounds on the right. None of them have Hydrofera Blue on them I am not exactly sure why this is not being followed as the facility did not contact us to let us know of any issues with obtaining dressings or otherwise. With that being said he is supposed to be using Hydrofera Blue on both of the wounds on the right foot as well as the ankle wound on the left side. 09/18/2020 upon evaluation today patient appears to be doing poorly with regard to his wounds. Again right now the left ankle in particular showed signs of extreme maceration. Apparently he was told by someone with staff at [facility] they could not get the [facility] With that being said this is something that is never been relayed to [place] one way or another. Also the patient subsequently has not supposed to have a border gauze dressing on. He should have an ABD pad and roll gauze to secure as this drains much too much just to have a border gauze dressing to cover. Nonetheless the fact that they are not using the appropriate dressing is directly causing deterioration of the left ankle wound it is significantly worse today compared to what it was previous. I did attempt to call [facility] while the patient was here I called three times and got no one to even pick up the phone. After this I had my for an  office coordinator call and she was able to finally get through and leave a message with the D ON as of dictation of this note which is roughly about an hour and a half later I still have not been able to speak with anyone at the facility. 09/25/2020 upon evaluation today patient actually showing signs of good improvement which is excellent news. He has been tolerating the dressing changes without complication. Fortunately there is no signs of active infection which is great news. No fevers, chills, nausea, vomiting, or diarrhea. I do feel like the facility has been doing a much better job at taking care of him as far as the dressings are concerned. However the director of nursing never did call me back. 10/09/2020 upon evaluation today patient appears to be doing well with regard to his wound. The toe ulcer did require some debridement but the other 2 areas actually appear to be doing quite well. 10/19/2020 upon evaluation today patient actually appears to be doing very well in regard to his wounds. In fact the heel does appear to be completely healed. The toe is doing better in the medial ankle on the left is also doing better. Overall I think he is headed in the right direction. 10/26/2020 upon evaluation today patient appears to be doing well with regard to his wound. He is showing signs of improvement which is great news and overall I am very pleased with where things stand today. No  fevers, chills, nausea, vomiting, or diarrhea. 11/02/2020 upon evaluation today patient appears to be doing well with regard to his wounds. He has been tolerating the dressing changes without complication overall I am extremely pleased with where things stand today. He in regard to the toe is almost completely healed and the medial ankle on the left is doing much better. 11/09/2020 upon evaluation today patient appears to be doing a little poorly in regard to his left medial ankle ulcer. Fortunately there does not appear to  be any signs of systemic infection but unfortunately locally he does appear to be infected in fact he has blue-green drainage consistent with Pseudomonas. 11/16/2020 upon evaluation today patient appears to be doing well with regard to his wound. It actually appears to be doing better. I did place him on gentamicin cream since the Cipro was actually resistant even though he was positive for Pseudomonas on culture. Overall I think that he does seem to be doing better though I am unsure whether or not they have actually been putting the cream on. The patient is not sure that we did talk to the nurse directly and she was going to initiate that treatment. Fortunately there does not appear to be any signs of active infection at this time. No fevers, chills, nausea, vomiting, or diarrhea. 4/28; the area on the right second toe is close to healed. Left medial ankle required debridement 12/07/2020 upon evaluation today patient appears to be doing well with regard to his wounds. In fact the right second toe appears to be completely healed which is great news. Fortunately there does not appear to be any signs of active infection at this time which is also great news. I think we can probably discontinue the gentamicin on top of everything else. 12/14/2020 upon evaluation today patient appears to be doing well with regard to his wound. He is making good progress and overall very pleased with where things stand today. There is no signs of active infection at this time which is great news. 12/28/2020 upon evaluation today patient appears to be doing well with regard to his wounds. He has been tolerating the dressing changes without complication. Fortunately there is no signs of active infection at this time. No fevers, chills, nausea, vomiting, or diarrhea. 12/28/2020 upon evaluation today patient's wound bed actually showed signs of excellent improvement. He has great epithelization and granulation I do not see any  signs of infection overall I am extremely pleased with where things stand at this point. No fevers, chills, nausea, vomiting, or diarrhea. 01/11/2021 upon evaluation today patient appears to be doing well with regard to his wound on his leg. He has been tolerating the dressing changes without complication. Fortunately there does not appear to be any signs of active infection which is great news. No fevers, chills, nausea, vomiting, or diarrhea. 01/25/2021 upon evaluation today patient appears to be doing well with regard to his wound. He has been tolerating the dressing changes without complication. Fortunately the collagen seems to be doing a great job which is excellent news. No fevers, chills, nausea, vomiting, or diarrhea. 02/08/2021 upon evaluation today patient's wound is actually looking a little bit worse especially in the periwound compared to previous. Fortunately there does not appear to be any signs of infection which is great news with that being said he does have some irritation around the periphery of the wound which has me more concerned. He actually had a dressing on that had not been changed in 3  days. He also is supposed to have daily dressing changes. With regard to the dressing applied he had a silver alginate dressing and silver collagen is what is recommended and ordered. He also had no Desitin around the edges of the wound in the periwound region although that is on the order inspect to be done as well. In general I was very concerned I did contact [facility] actually spoke with Magda Paganini who is the scheduling individual and subsequently she stated that she would pass the information to the D ON apparently the D ON was not available to talk to me when I call today. 02/18/2021 upon evaluation today patient's wound is actually showing signs of improvement. Fortunately there does not appear to be any evidence of infection which is great news overall I am extremely pleased with  where things stand today. No fevers, chills, nausea, vomiting, or diarrhea. 8/3; patient presents for 1 week follow-up. He has no issues or complaints today. He denies signs of infection. 03/11/2021 upon evaluation today patient appears to be doing well with regard to his wound. He does have a little bit of slough noted on the surface of the wound but fortunately there does not appear to be any signs of active infection at this time. No fevers, chills, nausea, vomiting, or diarrhea. 03/18/2021 upon evaluation today patient appears to be doing well with regard to his wound. He has been tolerating the dressing changes without complication. There was a little irritation more proximal to where the wound was that was not noted last week but nonetheless this is very superficial just seems to be more irritation we just need to make sure to put a good amount of the zinc over the area in my opinion. Otherwise he does not seem to be doing significantly worse at all which is great news. 03/25/2021 upon evaluation today patient appears to be doing well with regard to his wound. He is going require some sharp debridement today to clear with PERCY, WINTERROWD ([id number]) [id number] Page 3 of 11 some of the necrotic debris. I did perform this today without complication postdebridement wound bed appears to be doing much better this is great news. 04/08/2021 upon evaluation today patient appears to be doing decently well in regard to his wound although the overall measurement is not significantly smaller compared to previous. It is gone down a little bit but still the facility continues to not really put the appropriate dressings in place in fact he was supposed to have collagen we think he probably had more of an allergy to At this point. Fortunately there does not appear to be any signs of active infection systemically though locally I do not see anything on initial visualization either as  far as erythema or warmth. 04/15/2021 upon evaluation today patient appears to be doing well with regard to his wound. He is actually showing signs of improvement. I did place him on antibiotics last week, Cipro. He has been taking that 2 times a day and seems to be tolerating it very well. I do not see any evidence of worsening and in fact the overall appearance of the wound is smaller today which is also great news. 9/26; left medial ankle chronic venous insufficiency wound is improved. Using Hydrofera Blue 10/10; left medial ankle chronic venous insufficiency. Wound has not changed much in appearance completely nonviable surface. Apparently there have been problems getting the right product on the wound at the facility although he came in with [facility] on today  05/14/2021 upon evaluation today patient appears to be doing well with regard to his wound. I think he is making progress here which is good news. Fortunately there does not appear to be any signs of active infection at this time. No fevers, chills, nausea, vomiting, or diarrhea. 05/20/2021 upon evaluation today patient appears to be doing well with regard to his wound. He is showing signs of good improvement which is great news. There does not appear to be any evidence of active infection which is also excellent news. No fevers, chills, nausea, vomiting, or diarrhea. 05/28/2021 upon evaluation today patient appears to be doing quite well. There does not appear to be any signs of active infection at this time which is great news. Overall I am extremely pleased with where things stand today. I think he is headed in the right direction. 06/11/2021 upon evaluation today patient appears to be doing well with regard to his left ankle ulcer and poorly in regard to the toe ulcer on the second toe right foot. This appears to show signs of joint exposure. Apparently this has been present for 1 to 2 months although he kept forgetting to tell me  about it. That is unfortunate as right now it definitely appears to be doing significantly worse than what I would like to see. There does not appear to be any signs of active infection systemically though locally I am concerned about the possibility of infection the toe is quite red. Again no one from the facility ever contacted [place] to advise that this was going on in the interim either. 06/17/2021 upon evaluation today patient presents for follow-up I did review his x-ray which showed a navicular bone fracture I am unsure of the chronicity of this. Subsequently he also had osteomyelitis of the toe which was what I was more concerned about this did not show up on x-ray but did show up on the pathology scrapings. This was listed as acute osteomyelitis. Nonetheless at this point I think that the antibiotic treatment is the best regimen to go with currently. The patient is in agreement with that plan. Nonetheless he has initially 30 days of doxycycline off likely extend that towards the end of the treatment cycle that will be around the middle of December for an additional 2 weeks. That all depends on how well he continues to heal. Nonetheless based on what I am seeing in the foot I did want a proceed with an MRI as well which I think will be helpful to identify if there is anything else that needs to be addressed from the standpoint of infection. 06/24/2021 upon evaluation today patient appears to be doing pretty well in regards to his wounds. I think both are actually showing signs of improvement which is good I did review his MRI today which did show signs of osteomyelitis of the middle and proximal phalanx on his right foot of the affected toe. With that being said this is actually showing signs of significant improvement today already with the antibiotic therapy I think the redness is also improved. Overall I think that we just need to give this some time with appropriate wound care we will see how  things go potentially hyperbarics could be considered. 07/02/2021 upon inspection today patient actually appears to be doing well in regard to his left ankle which is getting very close to complete resolution of pleased in that regard. Unfortunately he is continuing to have issues with his second toe right foot and this seems  to still be very painful for him. Recommend he try something different from the standpoint of antibiotics. 07/15/2021 upon evaluation today patient appears to be doing actually pretty well in regard to his foot. This is actually showing signs of significant improvement which is great news. Overall I feel like the patient is improving both in regard to the second toe as well as the ankle on the left. With that being said the biggest issue that I do see currently is that he is needing to have a refill of the doxycycline that we previously treated him with. He also did see podiatry they are not going to recommend any amputation at this point since he seems to be doing quite well. For that reason we just need to keep things under control from an infection standpoint. 08/01/2021 upon evaluation today patient appears to be doing well with regard to his wound. He has been tolerating the dressing changes without complication. Fortunately there does not appear to be any evidence of active infection locally nor systemically at this point. In fact I think everything is doing excellent in fact his second toe on the right foot is almost healed and the ankle on the left ankle region is actually very close to being healed as well. 08/08/2021 upon evaluation today patient appears to be doing well with regard to his wound. He has been tolerating the dressing changes without complication. Fortunately I do not see any signs of active infection at this time. Readmission: 12-06-2021 upon evaluation today patient presents for reevaluation here in the clinic he does tell me that he was being seen in  facility at [place] by a provider that was coming in. He is not sure who this was. He tells me however that the wound seems to have gotten worse even compared to where it was when we last saw him at this point. With that being said I do believe that he is likely going need ongoing wound care here in the clinic and I do believe that we need to be the ones to frontline this since his wound does seem to be getting worse not better at this point. He voiced understanding. He is also in agreement with this plan and feels more comfortable coming here she tells me. Patient's medical history really has not changed since his prior admission he was only gone since January. 12-27-2021 upon evaluation today patient appears to be doing well with regard to his wound they did run out of the [facility] so they did not put anything on just an ABD pad with gentamicin. Still we are seeing some signs of good improvement here with some new epithelization which is great news. 01-10-2022 upon evaluation today patient appears to be doing well with regard to his wounds and he is going require some sharp debridement but overall seems to be making good progress. Fortunately I do not see any evidence of active infection locally or systemically at this time which is great news. 01-24-2022 upon evaluation today patient appears to be doing well with regard to his wound. The facility actually came and dropped him off early and he had another appointment at the hospital and then they just brought him over here and this was still hours before his appointment this afternoon. For that reason we did do our best to work him in this morning and fortunately had some space to make this happen. With that being said patient's wound does seem to be making progress here and I  am very pleased in that regard I do not see any signs of active infection locally or systemically at this time. 02-07-2022 patient appears to be doing well  currently in regard to his wounds. In fact one of them the more proximal is healed the distal is still open but seems to be doing excellent. Fortunately I do not see any evidence of active infection locally or systemically at this time which is great news. No fevers, chills, nausea, vomiting, or diarrhea. 7/27; left medial ankle venous. Improving per our intake nurse. We are using Hydrofera Blue under Tubigrip compression. They are changing that at his facility 03-06-2022 upon evaluation today patient appears to be doing well with regard to the his wound he is can require some sharp debridement but seems to be making excellent progress. Fortunately I do not see any evidence of active infection locally or systemically which is great news. 03-27-2022 upon evaluation today patient's wound is actually showing signs of excellent improvement. Fortunately I see no signs of active infection locally or systemically at this time which is great news. No fevers, chills, nausea, vomiting, or diarrhea. IZZAK, FRIES ([id number]) [id number] Page 4 of 11 04-21-2022 upon evaluation today patient appears to be doing excellent in regard to his wound in fact this is very close to resolution based on what I am seeing. I do not see any evidence of active infection locally or systemically at this time which is great news and overall I am extremely pleased with where we are today. 05-05-2022 upon evaluation today patient's wound actually appears to potentially be completely healed. Fortunately I do not see any evidence of active infection at this time which is great news and overall I am very pleased I think this needs a little time to toughen up but other than that I really do believe were doing quite well. He is very pleased to hear this its been a long time coming. 05-19-2022 upon evaluation today patient appears to be doing well currently in regard to his wound. He in fact we were hoping will  be completely healed and closed but it still has a very tiny area right in the center which is still continuing to drain. Fortunately I do not see any signs of infection locally or systemically which is great news. 11/6; this wound is close to completely" he comes in this week with some erythema at 1 edge of this which looks like something was rubbing on the area. Other than that no evidence of infection 06-16-2022 upon evaluation today patient appears to be doing well currently in regard to his ankle ulcer. This is very close to being healed but still has a small area in the central portion which is not. Fortunately there does not appear to be any signs of infection locally or systemically which is great news. No fevers, chills, nausea, vomiting, or diarrhea. 06-30-2022 upon evaluation patient's wound pretty much appears to be almost completely closed. In fact it may even be closed but there are still a lot of skin irritation around the edges of the wound and I just do not feel great about releasing them yet I would like to continue to monitor this just a little bit longer before getting to that point. He is not opposed to this and in fact is happy to continue to come in if need be in order to make sure that things are moving in the right direction he definitely does not want to backtrack. Electronic Signature(s) Signed: 06/30/2022 1:39:25  PM By: Worthy Keeler PA-C Entered By: Worthy Keeler on 06/30/2022 13:39:25 -------------------------------------------------------------------------------- Physical Exam Details Patient Name: Date of Service: QUADARIUS, HENTON 06/30/2022 9:45 A M Medical Record Number: [id number] Patient Account Number: [id number] Date of Birth/Sex: Treating RN: 22-Mar-1961 (61 y.o. [name] Primary Care Provider: [name] Other Clinician: [name] Referring Provider: Treating Provider/Extender: [name] in Treatment:  29 Constitutional Well-nourished and well-hydrated in no acute distress. Respiratory normal breathing without difficulty. Psychiatric this patient is able to make decisions and demonstrates good insight into disease process. Alert and Oriented x 3. pleasant and cooperative. Notes Patient's wound bed actually showed signs again of doing quite well there does not appear to be any evidence of infection which is great news and I am very pleased in that regard with that being said I do think we need to continue to monitor for any signs of infection or worsening obviously if anything changes he knows he can contact the office and let me know. Electronic Signature(s) Signed: 06/30/2022 1:39:54 PM By: Worthy Keeler PA-C Entered By: Worthy Keeler on 06/30/2022 13:39:54 [name] ([id number]) [id number] Page 5 of 11 -------------------------------------------------------------------------------- Physician Orders Details Patient Name: Date of Service: LINK, BURGESON 06/30/2022 9:45 A M Medical Record Number: [id number] Patient Account Number: [id number] Date of Birth/Sex: Treating RN: 03/21/1961 (61 y.o. 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 Bathing/ [employer] wounds with antibacterial soap and water. No tub bath. Anesthetic (Use 'Patient Medications' Section for Anesthetic Order Entry) Lidocaine applied to  wound bed Edema Control - Lymphedema / Segmental Compressive Device / Other Tubigrip single layer applied. - Tubi D left lower leg Medications-Please add to medication list. Other: - apply AandD ointment to entire area of wound Wound Treatment Wound #12 - Ankle Wound Laterality: Left, Medial, Distal Cleanser: Soap and Water 3 x Per Week/30 Days Discharge Instructions: Gently cleanse wound with antibacterial soap, rinse and pat dry prior to dressing wounds Cleanser: Wound Cleanser 3 x Per Week/30 Days Discharge Instructions: Wash your hands with soap and water. Remove old dressing, discard into plastic bag and place into trash. Cleanse the wound with Wound Cleanser prior to applying a clean dressing using gauze sponges, not tissues or cotton balls. Do not scrub or use excessive force. Pat dry using gauze sponges, not tissue or cotton balls. Topical: Gentell AandD+E Ointment, Jar 16 (oz) 3 x Per Week/30 Days Secondary Dressing: ABD Pad 5x9 (in/in) 3 x Per Week/30 Days Discharge Instructions: Cover with ABD pad Secured With: Medipore T - 43M Medipore H Soft Cloth Surgical T ape ape, 2x2 (in/yd) 3 x Per Week/30 Days Secured With: Conform 4'' - Conforming Stretch Gauze Bandage 4x75 (in/in) 3 x Per Week/30 Days Discharge Instructions: Apply as directed Secured With: Tubigrip Size D, 3x10 (in/yd) 3 x Per Week/30 Days Discharge Instructions: single layer Electronic Signature(s) Signed: 06/30/2022 4:48:10 PM By: Worthy Keeler PA-C Signed: 06/30/2022 4:50:46 PM By: [name] Entered By: [name] on 06/30/2022 10:34:26 [name] ([id number]) [id number] Page 6 of 11 -------------------------------------------------------------------------------- Problem List Details  Patient Name: Date of Service: VINCENZO, STAVE 06/30/2022 9:45 A M Medical Record Number: [id number] Patient Account Number: [id number] Date of Birth/Sex: Treating RN: Dec 09, 1960 (61 y.o. [name], [name] Primary Care Provider: [name] Other Clinician: [name] Referring Provider: Treating Provider/Extender: [name] in Treatment: 29 Active Problems ICD-10 Encounter Code Description Active Date MDM Diagnosis 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 (idiopathic) with ulcer and inflammation of left 12/06/2021 No Yes lower extremity L97.322 Non-pressure chronic ulcer of left ankle with fat layer exposed 12/06/2021 No Yes I69.354 Hemiplegia and hemiparesis following cerebral infarction affecting left non- 12/06/2021 No Yes dominant side Inactive Problems Resolved Problems Electronic Signature(s) Signed: 06/30/2022 9:59:48 AM By: Worthy Keeler PA-C Entered By: Worthy Keeler on 06/30/2022 09:59:48 -------------------------------------------------------------------------------- Progress Note Details Patient Name: Date of Service: Chad [name] 06/30/2022 9:45 A M Medical Record Number: [id number] Patient Account Number: [id number] Date of Birth/Sex: Treating RN: 07/12/1961 (61 y.o. [name] Primary Care Provider: [name] Other Clinician: [name] Referring Provider: Treating Provider/Extender: [name] in Treatment: [address] [name] ([id number]) [id number] Page 7 of 11 Chief Complaint Information obtained from Patient Left ankle ulcers History of Present Illness (HPI) 10/08/18 on evaluation today patient actually presents to our office for initial evaluation concerning wounds that he has of the bilateral lower extremities. 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 He also has bilateral venous stasis. He apparently has been homeless currently following discharge from the hospital apparently he has been  placed at [facility] which is is a skilled nursing facility locally. Nonetheless fortunately he does not show any signs of infection at this time which is good news. In fact several of the wound actually appears to be showing some signs of improvement already in my pinion. There are a couple areas in the left leg in particular there likely gonna require some sharp debridement to help clear away some necrotic tissue and help with more sufficient healing. No fevers, chills, nausea, or vomiting noted at this time. 10/15/18 on evaluation today patient actually appears to be doing very well in regard to his bilateral lower extremities. He's been tolerating the dressing changes without complication. Fortunately there does not appear to be any evidence of active infection at this time which is great news. Overall I'm actually very pleased with how this has progressed in just one visits time. Readmission: 08/14/2020 upon evaluation today patient presents for re-evaluation here in our clinic. He is having issues with his left ankle region as well as his right toe and his right heel. He tells me that the toe and heel actually began as a area that was itching that he was scratching and then subsequently opened up into wounds. These may have been abscess areas I presume based on what I am seeing currently. With regard to his left ankle region he tells me this was a similar type occurrence although he does have venous stasis this very well may be more of a venous leg ulcer more than anything. Nonetheless I do believe that the patient would benefit from appropriate and aggressive wound care to try to help get things under better control here. He does have history of a stroke on the left side affecting him to some degree there that he is able to stand although he does have  some residual weakness. Otherwise again the patient does have chronic venous insufficiency as previously noted. His arterial studies most  recently obtained showed that he had an ABI on the right of 1.16 with a TBI of 0.52 and on the left and ABI of 1.14 with a TBI of 0.81. That was obtained on 06/19/2020. 08/28/2020 upon evaluation today patient appears to be doing decently well in regard to his wounds in general. He has been tolerating the dressing changes without complication. Fortunately there does not appear to be any signs of active infection which is great news. With that being said I think the [facility] is doing a good job I would recommend that we likely continue with that currently. 09/11/2020 upon evaluation today patient's wounds did not appear to be doing too poorly but again he is not really showing signs of significant improvement with regard to any of the wounds on the right. None of them have Hydrofera Blue on them I am not exactly sure why this is not being followed as the facility did not contact us to let us know of any issues with obtaining dressings or otherwise. With that being said he is supposed to be using Hydrofera Blue on both of the wounds on the right foot as well as the ankle wound on the left side. 09/18/2020 upon evaluation today patient appears to be doing poorly with regard to his wounds. Again right now the left ankle in particular showed signs of extreme maceration. Apparently he was told by someone with staff at [facility] they could not get the [facility]. With that being said this is something that is never been relayed to [place] one way or another. Also the patient subsequently has not supposed to have a border gauze dressing on. He should have an ABD pad and roll gauze to secure as this drains much too much just to have a border gauze dressing to cover. Nonetheless the fact that they are not using the appropriate dressing is directly causing deterioration of the left ankle wound it is significantly worse today compared to what it was previous. I did attempt to call [facility]  while the patient was here I called three times and got no one to even pick up the phone. After this I had my for an office coordinator call and she was able to finally get through and leave a message with the D ON as of dictation of this note which is roughly about an hour and a half later I still have not been able to speak with anyone at the facility. 09/25/2020 upon evaluation today patient actually showing signs of good improvement which is excellent news. He has been tolerating the dressing changes without complication. Fortunately there is no signs of active infection which is great news. No fevers, chills, nausea, vomiting, or diarrhea. I do feel like the facility has been doing a much better job at taking care of him as far as the dressings are concerned. However the director of nursing never did call me back. 10/09/2020 upon evaluation today patient appears to be doing well with regard to his wound. The toe ulcer did require some debridement but the other 2 areas actually appear to be doing quite well. 10/19/2020 upon evaluation today patient actually appears to be doing very well in regard to his wounds. In fact the heel does appear to be completely healed. The toe is doing better in the medial ankle on the left is also doing better. Overall  I think he is headed in the right direction. 10/26/2020 upon evaluation today patient appears to be doing well with regard to his wound. He is showing signs of improvement which is great news and overall I am very pleased with where things stand today. No fevers, chills, nausea, vomiting, or diarrhea. 11/02/2020 upon evaluation today patient appears to be doing well with regard to his wounds. He has been tolerating the dressing changes without complication overall I am extremely pleased with where things stand today. He in regard to the toe is almost completely healed and the medial ankle on the left is doing much better. 11/09/2020 upon evaluation today patient  appears to be doing a little poorly in regard to his left medial ankle ulcer. Fortunately there does not appear to be any signs of systemic infection but unfortunately locally he does appear to be infected in fact he has blue-green drainage consistent with Pseudomonas. 11/16/2020 upon evaluation today patient appears to be doing well with regard to his wound. It actually appears to be doing better. I did place him on gentamicin cream since the Cipro was actually resistant even though he was positive for Pseudomonas on culture. Overall I think that he does seem to be doing better though I am unsure whether or not they have actually been putting the cream on. The patient is not sure that we did talk to the nurse directly and she was going to initiate that treatment. Fortunately there does not appear to be any signs of active infection at this time. No fevers, chills, nausea, vomiting, or diarrhea. 4/28; the area on the right second toe is close to healed. Left medial ankle required debridement 12/07/2020 upon evaluation today patient appears to be doing well with regard to his wounds. In fact the right second toe appears to be completely healed which is great news. Fortunately there does not appear to be any signs of active infection at this time which is also great news. I think we can probably discontinue the gentamicin on top of everything else. 12/14/2020 upon evaluation today patient appears to be doing well with regard to his wound. He is making good progress and overall very pleased with where things stand today. There is no signs of active infection at this time which is great news. 12/28/2020 upon evaluation today patient appears to be doing well with regard to his wounds. He has been tolerating the dressing changes without complication. Fortunately there is no signs of active infection at this time. No fevers, chills, nausea, vomiting, or diarrhea. 12/28/2020 upon evaluation today patient's wound bed  actually showed signs of excellent improvement. He has great epithelization and granulation I do not see any signs of infection overall I am extremely pleased with where things stand at this point. No fevers, chills, nausea, vomiting, or diarrhea. 01/11/2021 upon evaluation today patient appears to be doing well with regard to his wound on his leg. He has been tolerating the dressing changes without complication. Fortunately there does not appear to be any signs of active infection which is great news. No fevers, chills, nausea, vomiting, or diarrhea. 01/25/2021 upon evaluation today patient appears to be doing well with regard to his wound. He has been tolerating the dressing changes without complication. MANFRED, LASPINA ([id number]) [id number] Page 8 of 11 Fortunately the collagen seems to be doing a great job which is excellent news. No fevers, chills, nausea, vomiting, or diarrhea. 02/08/2021 upon evaluation today patient's wound is actually looking a little bit worse especially  in the periwound compared to previous. Fortunately there does not appear to be any signs of infection which is great news with that being said he does have some irritation around the periphery of the wound which has me more concerned. He actually had a dressing on that had not been changed in 3 days. He also is supposed to have daily dressing changes. With regard to the dressing applied he had a silver alginate dressing and silver collagen is what is recommended and ordered. He also had no Desitin around the edges of the wound in the periwound region although that is on the order inspect to be done as well. In general I was very concerned I did contact [facility] actually spoke with Magda Paganini who is the scheduling individual and subsequently she stated that she would pass the information to the D ON apparently the D ON was not available to talk to me when I call today. 02/18/2021 upon  evaluation today patient's wound is actually showing signs of improvement. Fortunately there does not appear to be any evidence of infection which is great news overall I am extremely pleased with where things stand today. No fevers, chills, nausea, vomiting, or diarrhea. 8/3; patient presents for 1 week follow-up. He has no issues or complaints today. He denies signs of infection. 03/11/2021 upon evaluation today patient appears to be doing well with regard to his wound. He does have a little bit of slough noted on the surface of the wound but fortunately there does not appear to be any signs of active infection at this time. No fevers, chills, nausea, vomiting, or diarrhea. 03/18/2021 upon evaluation today patient appears to be doing well with regard to his wound. He has been tolerating the dressing changes without complication. There was a little irritation more proximal to where the wound was that was not noted last week but nonetheless this is very superficial just seems to be more irritation we just need to make sure to put a good amount of the zinc over the area in my opinion. Otherwise he does not seem to be doing significantly worse at all which is great news. 03/25/2021 upon evaluation today patient appears to be doing well with regard to his wound. He is going require some sharp debridement today to clear with some of the necrotic debris. I did perform this today without complication postdebridement wound bed appears to be doing much better this is great news. 04/08/2021 upon evaluation today patient appears to be doing decently well in regard to his wound although the overall measurement is not significantly smaller compared to previous. It is gone down a little bit but still the facility continues to not really put the appropriate dressings in place in fact he was supposed to have collagen we think he probably had more of an allergy to At this point. Fortunately there does not appear to be any  signs of active infection systemically though locally I do not see anything on initial visualization either as far as erythema or warmth. 04/15/2021 upon evaluation today patient appears to be doing well with regard to his wound. He is actually showing signs of improvement. I did place him on antibiotics last week, Cipro. He has been taking that 2 times a day and seems to be tolerating it very well. I do not see any evidence of worsening and in fact the overall appearance of the wound is smaller today which is also great news. 9/26; left medial ankle chronic venous insufficiency wound  is improved. Using Hydrofera Blue 10/10; left medial ankle chronic venous insufficiency. Wound has not changed much in appearance completely nonviable surface. Apparently there have been problems getting the right product on the wound at the facility although he came in with [facility] on today 05/14/2021 upon evaluation today patient appears to be doing well with regard to his wound. I think he is making progress here which is good news. Fortunately there does not appear to be any signs of active infection at this time. No fevers, chills, nausea, vomiting, or diarrhea. 05/20/2021 upon evaluation today patient appears to be doing well with regard to his wound. He is showing signs of good improvement which is great news. There does not appear to be any evidence of active infection which is also excellent news. No fevers, chills, nausea, vomiting, or diarrhea. 05/28/2021 upon evaluation today patient appears to be doing quite well. There does not appear to be any signs of active infection at this time which is great news. Overall I am extremely pleased with where things stand today. I think he is headed in the right direction. 06/11/2021 upon evaluation today patient appears to be doing well with regard to his left ankle ulcer and poorly in regard to the toe ulcer on the second toe right foot. This appears to show signs  of joint exposure. Apparently this has been present for 1 to 2 months although he kept forgetting to tell me about it. That is unfortunate as right now it definitely appears to be doing significantly worse than what I would like to see. There does not appear to be any signs of active infection systemically though locally I am concerned about the possibility of infection the toe is quite red. Again no one from the facility ever contacted [place] to advise that this was going on in the interim either. 06/17/2021 upon evaluation today patient presents for follow-up I did review his x-ray which showed a navicular bone fracture I am unsure of the chronicity of this. Subsequently he also had osteomyelitis of the toe which was what I was more concerned about this did not show up on x-ray but did show up on the pathology scrapings. This was listed as acute osteomyelitis. Nonetheless at this point I think that the antibiotic treatment is the best regimen to go with currently. The patient is in agreement with that plan. Nonetheless he has initially 30 days of doxycycline off likely extend that towards the end of the treatment cycle that will be around the middle of December for an additional 2 weeks. That all depends on how well he continues to heal. Nonetheless based on what I am seeing in the foot I did want a proceed with an MRI as well which I think will be helpful to identify if there is anything else that needs to be addressed from the standpoint of infection. 06/24/2021 upon evaluation today patient appears to be doing pretty well in regards to his wounds. I think both are actually showing signs of improvement which is good I did review his MRI today which did show signs of osteomyelitis of the middle and proximal phalanx on his right foot of the affected toe. With that being said this is actually showing signs of significant improvement today already with the antibiotic therapy I think the redness is also  improved. Overall I think that we just need to give this some time with appropriate wound care we will see how things go potentially hyperbarics could be considered.  07/02/2021 upon inspection today patient actually appears to be doing well in regard to his left ankle which is getting very close to complete resolution of pleased in that regard. Unfortunately he is continuing to have issues with his second toe right foot and this seems to still be very painful for him. Recommend he try something different from the standpoint of antibiotics. 07/15/2021 upon evaluation today patient appears to be doing actually pretty well in regard to his foot. This is actually showing signs of significant improvement which is great news. Overall I feel like the patient is improving both in regard to the second toe as well as the ankle on the left. With that being said the biggest issue that I do see currently is that he is needing to have a refill of the doxycycline that we previously treated him with. He also did see podiatry they are not going to recommend any amputation at this point since he seems to be doing quite well. For that reason we just need to keep things under control from an infection standpoint. 08/01/2021 upon evaluation today patient appears to be doing well with regard to his wound. He has been tolerating the dressing changes without complication. Fortunately there does not appear to be any evidence of active infection locally nor systemically at this point. In fact I think everything is doing excellent in fact his second toe on the right foot is almost healed and the ankle on the left ankle region is actually very close to being healed as well. 08/08/2021 upon evaluation today patient appears to be doing well with regard to his wound. He has been tolerating the dressing changes without complication. Fortunately I do not see any signs of active infection at this time. Readmission: 12-06-2021 upon  evaluation today patient presents for reevaluation here in the clinic he does tell me that he was being seen in facility at [facility] by a provider that was coming in. He is not sure who this was. He tells me however that the wound seems to have gotten worse even compared to where it was when we last saw him at this point. With that being said I do believe that he is likely going need ongoing wound care here in the clinic and I do believe that we need to be the ones to frontline this since his wound does seem to be getting worse not better at this point. He voiced understanding. He is also in agreement with this plan and feels more comfortable coming here she tells me. ALFREDO, SPONG ([id number]) [id number] Page 9 of 11 Patient's medical history really has not changed since his prior admission he was only gone since January. 12-27-2021 upon evaluation today patient appears to be doing well with regard to his wound they did run out of the [facility] so they did not put anything on just an ABD pad with gentamicin. Still we are seeing some signs of good improvement here with some new epithelization which is great news. 01-10-2022 upon evaluation today patient appears to be doing well with regard to his wounds and he is going require some sharp debridement but overall seems to be making good progress. Fortunately I do not see any evidence of active infection locally or systemically at this time which is great news. 01-24-2022 upon evaluation today patient appears to be doing well with regard to his wound. The facility actually came and dropped him off early and he had another appointment at the hospital and then  they just brought him over here and this was still hours before his appointment this afternoon. For that reason we did do our best to work him in this morning and fortunately had some space to make this happen. With that being said patient's wound does seem to  be making progress here and I am very pleased in that regard I do not see any signs of active infection locally or systemically at this time. 02-07-2022 patient appears to be doing well currently in regard to his wounds. In fact one of them the more proximal is healed the distal is still open but seems to be doing excellent. Fortunately I do not see any evidence of active infection locally or systemically at this time which is great news. No fevers, chills, nausea, vomiting, or diarrhea. 7/27; left medial ankle venous. Improving per our intake nurse. We are using Hydrofera Blue under Tubigrip compression. They are changing that at his facility 03-06-2022 upon evaluation today patient appears to be doing well with regard to the his wound he is can require some sharp debridement but seems to be making excellent progress. Fortunately I do not see any evidence of active infection locally or systemically which is great news. 03-27-2022 upon evaluation today patient's wound is actually showing signs of excellent improvement. Fortunately I see no signs of active infection locally or systemically at this time which is great news. No fevers, chills, nausea, vomiting, or diarrhea. 04-21-2022 upon evaluation today patient appears to be doing excellent in regard to his wound in fact this is very close to resolution based on what I am seeing. I do not see any evidence of active infection locally or systemically at this time which is great news and overall I am extremely pleased with where we are today. 05-05-2022 upon evaluation today patient's wound actually appears to potentially be completely healed. Fortunately I do not see any evidence of active infection at this time which is great news and overall I am very pleased I think this needs a little time to toughen up but other than that I really do believe were doing quite well. He is very pleased to hear this its been a long time coming. 05-19-2022 upon evaluation  today patient appears to be doing well currently in regard to his wound. He in fact we were hoping will be completely healed and closed but it still has a very tiny area right in the center which is still continuing to drain. Fortunately I do not see any signs of infection locally or systemically which is great news. 11/6; this wound is close to completely" he comes in this week with some erythema at 1 edge of this which looks like something was rubbing on the area. Other than that no evidence of infection 06-16-2022 upon evaluation today patient appears to be doing well currently in regard to his ankle ulcer. This is very close to being healed but still has a small area in the central portion which is not. Fortunately there does not appear to be any signs of infection locally or systemically which is great news. No fevers, chills, nausea, vomiting, or diarrhea. 06-30-2022 upon evaluation patient's wound pretty much appears to be almost completely closed. In fact it may even be closed but there are still a lot of skin irritation around the edges of the wound and I just do not feel great about releasing them yet I would like to continue to monitor this just a little bit longer before getting to  that point. He is not opposed to this and in fact is happy to continue to come in if need be in order to make sure that things are moving in the right direction he definitely does not want to backtrack. Objective Constitutional Well-nourished and well-hydrated in no acute distress. Vitals Time Taken: 10:11 AM, Height: 69 in, Weight: 150 lbs, BMI: 22.1, Temperature: 97.8 F, Pulse: 101 bpm, Respiratory Rate: 18 breaths/min, Blood Pressure: 148/77 mmHg. Respiratory normal breathing without difficulty. Psychiatric this patient is able to make decisions and demonstrates good insight into disease process. Alert and Oriented x 3. pleasant and cooperative. General Notes: Patient's wound bed actually showed signs  again of doing quite well there does not appear to be any evidence of infection which is great news and I am very pleased in that regard with that being said I do think we need to continue to monitor for any signs of infection or worsening obviously if anything changes he knows he can contact the office and let me know. Integumentary (Hair, Skin) Wound #12 status is Open. Original cause of wound was Gradually Appeared. The date acquired was: 07/12/2019. The wound has been in treatment 29 weeks. The wound is located on the Left,Distal,Medial Ankle. The wound measures 0.1cm length x 0.1cm width x 0.1cm depth; 0.008cm^2 area and 0.001cm^3 volume. There is Fat Layer (Subcutaneous Tissue) exposed. There is no tunneling or undermining noted. There is a none present amount of drainage noted. The wound margin is distinct with the outline attached to the wound base. There is no granulation within the wound bed. There is no necrotic tissue within the wound bed. 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 Bathing/ Shower/ Hygiene: Wash wounds with antibacterial soap and water. No tub bath. Anesthetic (Use 'Patient Medications' Section for Anesthetic Order Entry): Lidocaine applied to wound bed Edema Control - Lymphedema / Segmental Compressive Device / Other: Tubigrip single layer applied. - Tubi D left lower leg Medications-Please add to medication list.: Other: - apply AandD ointment to entire area of wound WOUND #12: - Ankle Wound Laterality: Left, Medial, Distal Cleanser: Soap and Water 3 x Per Week/30 Days Discharge  Instructions: Gently cleanse wound with antibacterial soap, rinse and pat dry prior to dressing wounds Cleanser: Wound Cleanser 3 x Per Week/30 Days Discharge Instructions: Wash your hands with soap and water. Remove old dressing, discard into plastic bag and place into trash. Cleanse the wound with Wound Cleanser prior to applying a clean dressing using gauze sponges, not tissues or cotton balls. Do not scrub or use excessive force. Pat dry using gauze sponges, not tissue or cotton balls. Topical: Gentell AandD+E Ointment, Jar 16 (oz) 3 x Per Week/30 Days Secondary Dressing: ABD Pad 5x9 (in/in) 3 x Per Week/30 Days Discharge Instructions: Cover with ABD pad Secured With: Medipore T - 75M Medipore H Soft Cloth Surgical T ape ape, 2x2 (in/yd) 3 x Per Week/30 Days Secured With: Conform 4'' - Conforming Stretch Gauze Bandage 4x75 (in/in) 3 x Per Week/30 Days Discharge Instructions: Apply as directed Secured With: Tubigrip Size D, 3x10 (in/yd) 3 x Per Week/30 Days Discharge Instructions: single layer 1. I am going to suggest that we have the patient continue to monitor for any signs of  infection or worsening. Obviously if he notices something that gets significantly worse between now and when I next see him he should contact the office and let me know otherwise based on what I am seeing right now I think we will get a continue with the AandD ointment around the wound itself followed by an ABD pad and roll gauze. 2. I am also can recommend he continue with the Tubigrip and we will go to size D instead of C since this he was cutting off too much at the knee and causing it as well. We will see patient back for reevaluation in 2 weeks here in the clinic. If anything worsens or changes patient will contact our office for additional recommendations. Electronic Signature(s) Signed: 06/30/2022 1:40:22 PM By: Worthy Keeler PA-C Entered By: Worthy Keeler on 06/30/2022  13:40:22 -------------------------------------------------------------------------------- SuperBill Details Patient Name: Date of Service: RYMAN, RATHGEBER 06/30/2022 Medical Record Number: [id number] Patient Account Number: [id number] Date of Birth/Sex: Treating RN: Jan 04, 1961 (61 y.o. [name] Primary Care Provider: [name] Other Clinician: [name] Referring Provider: Treating Provider/Extender: [name] in Treatment: 29 Diagnosis Coding ICD-10 Codes Code Description JOHNGABRIEL, VERDE ([id number]) [id number] Page 11 of 11 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 (idiopathic) with ulcer and inflammation of left lower extremity L97.322 Non-pressure chronic ulcer of left ankle with fat layer exposed 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 side Facility Procedures : CPT4 Code: [id number] Description: 99213 - WOUND CARE VISIT-LEV 3 EST PT Modifier: Quantity: 1 Physician Procedures : CPT4 Code Description Modifier [id number] - WC PHYS LEVEL 3 - EST PT ICD-10 Diagnosis Description 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 (idiopathic) with ulcer and inflammation of left lower extremity L97.322 Non-pressure chronic ulcer of left ankle  with fat layer exposed 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 side Quantity: 1 Electronic Signature(s) Signed: 06/30/2022 1:40:31 PM By: Worthy Keeler PA-C Entered By: Worthy Keeler on 06/30/2022 13:40:31

## 2022-06-30 NOTE — Progress Notes (Addendum)
ZIA, KANNER ([id number]) [id number] Page 1 of 8 Visit Report for 06/30/2022 Arrival Information Details Patient Name: Date of Service: Chad [name], Chad [name] 06/30/2022 9:45 A M Medical Record Number: [id number] Patient Account Number: [id number] Date of Birth/Sex: Treating RN: [birth year] (61 y.o. [name], [name] Primary Care Chad [name]: [name] Other Clinician: [name] Referring Mittie Knittel: Treating Jaiya Mooradian/Extender: [name] in Treatment: 29 Visit Information History Since Last Visit All ordered tests and consults were completed: No Patient Arrived: Wheel Chair Added or deleted any medications: No Arrival Time: 10:04 Any new allergies or adverse reactions: No Transfer Assistance: EasyPivot Patient Lift Had a fall or experienced change in No Patient Identification Verified: Yes activities of daily living that may affect Secondary Verification Process Completed: Yes risk of falls: Patient Requires Transmission-Based No Signs or symptoms of abuse/neglect since last visito No Precautions: Hospitalized since last visit: No Patient Has Alerts: Yes Implantable device outside of the clinic excluding No Patient Alerts: Patient on Blood Thinner cellular tissue based products placed in the center NOT diabetic since last visit: aspirin 18m Has Dressing in Place as Prescribed: [facility] Present Now: No Electronic Signature(s) Signed: 06/30/2022 4:50:46 PM By: VMassie KluverEntered By: VMassie Kluveron 06/30/2022 10:10:40 -------------------------------------------------------------------------------- Clinic Level of Care Assessment Details Patient Name: Date of Service: Chad [name], WITHEM12/10/2021 9:45 A M Medical Record Number: [id number] Account Number: [id number] of Birth/Sex: Treating RN: 312/15/1962(61y.o. MVerl BlalockPrimary Care Iliyana Convey: LErik ObeyOther Clinician: VMassie KluverReferring Nyeisha Goodall: Treating Janaiah Vetrano/Extender: SVickki Muffin Treatment: 29 Clinic Level of Care Assessment Items TOOL 4 Quantity Score _0  - 0 Use when only an EandM is performed on FOLLOW-UP visit ASSESSMENTS - Nursing Assessment / Reassessment X- 1 10 Reassessment of Co-morbidities (includes updates in patient status) X- 1 5 Reassessment of Adherence to Treatment Plan LKEISON, [name]([id number] Page 2 of 8 ASSESSMENTS - Wound and Skin A ssessment / Reassessment X - Simple Wound Assessment / Reassessment - one wound 1 5 _1  - 0 Complex Wound Assessment / Reassessment - multiple wounds _2  - 0 Dermatologic / Skin Assessment (not related to wound area) ASSESSMENTS - Focused Assessment _3  - 0 Circumferential Edema Measurements - multi extremities _4  - 0 Nutritional Assessment / Counseling / Intervention _5  - 0 Lower Extremity Assessment (monofilament, tuning fork, pulses) _6  - 0 Peripheral Arterial Disease Assessment (using hand held doppler) ASSESSMENTS - Ostomy and/or Continence Assessment and Care _7  - 0 Incontinence Assessment and Management _8  - 0 Ostomy Care Assessment and Management (repouching, etc.) PROCESS - Coordination of Care X - Simple Patient / Family Education for ongoing care 1 15 _9  - 0 Complex (extensive) Patient / Family Education for ongoing care _10  - 0 Staff obtains CProgrammer, systems Records, T Results / Process Orders est _11  - 0 Staff telephones HHA, Nursing Homes / Clarify orders / etc _12  - 0 Routine Transfer to another Facility (non-emergent condition) _13  - 0 Routine Hospital Admission (non-emergent condition) _14  - 0 New Admissions / IBiomedical engineer/ Ordering NPWT Apligraf, etc. , _15  - 0 Emergency Hospital Admission (emergent condition) X- 1 10 Simple Discharge Coordination _16  - 0 Complex (extensive) Discharge Coordination PROCESS - Special Needs _17  - 0 Pediatric /  Minor Patient Management _18  - 0 Isolation Patient Management _19  - 0 Hearing / Language / Visual special needs _20  - 0 Assessment of Community assistance (transportation, D/C planning, etc.) _21  - 0 Additional assistance / Altered  mentation _0  - 0 Support Surface(s) Assessment (bed, cushion, seat, etc.) INTERVENTIONS - Wound Cleansing / Measurement X - Simple Wound Cleansing - one wound 1 5 _1  - 0 Complex Wound Cleansing - multiple wounds X- 1 5 Wound Imaging (photographs - any number of wounds) _2  - 0 Wound Tracing (instead of photographs) X- 1 5 Simple Wound Measurement - one wound _3  - 0 Complex Wound Measurement - multiple wounds INTERVENTIONS - Wound Dressings X - Small Wound Dressing one or multiple wounds 1 10 _4  - 0 Medium Wound Dressing one or multiple wounds _5  - 0 Large Wound Dressing one or multiple wounds X- 1 5 Application of Medications - topical <ZOXWRUEAVWUJWJXB>_1<\/YNWGNFAOZHYQMVHQ>_4  - 0 Application of Medications - injection INTERVENTIONS - Miscellaneous _7  - 0 External ear exam Chad [name], Chad [name] ([id number]) [id number] Page 3 of 8 _8  - 0 Specimen Collection (cultures, biopsies, blood, body fluids, etc.) _9  - 0 Specimen(s) / Culture(s) sent or taken to Lab for analysis _10  - 0 Patient Transfer (multiple staff / Harrel Lemon Lift / Similar devices) _11  - 0 Simple Staple / Suture removal (25 or less) _12  - 0 Complex Staple / Suture removal (26 or more) _13  - 0 Hypo / Hyperglycemic Management (close monitor of Blood Glucose) _14  - 0 Ankle / Brachial Index (ABI) - do not check if billed separately X- 1 5 Vital Signs Has the patient been seen at the hospital within the last three years: Yes Total Score: 80 Level Of Care: New/Established - Level 3 Electronic Signature(s) Signed: 06/30/2022 4:50:46 PM By: [name] Entered By: [name] on 06/30/2022 10:35:02 -------------------------------------------------------------------------------- Encounter Discharge  Information Details Patient Name: Date of Service: Chad [name] 06/30/2022 9:45 A M Medical Record Number: [id number] Patient Account Number: [id number] Date of Birth/Sex: Treating RN: [birth year] (61 y.o. [name] Primary Care Avinash Maltos: [name] Other Clinician: [name] Referring Ileanna Gemmill: Treating Wane Mollett/Extender: [name] in Treatment: 29 Encounter Discharge Information Items Discharge Condition: Stable Ambulatory Status: Wheelchair Discharge Destination: [place] Telephoned: No Orders Sent: Yes Transportation: Other Accompanied By: self Schedule Follow-up Appointment: Yes Clinical Summary of Care: Electronic Signature(s) Signed: 06/30/2022 4:50:46 PM By: [name] Entered By: [name] on 06/30/2022 13:11:33 [name] ([id number]) [id number] Page 4 of 8 -------------------------------------------------------------------------------- Lower Extremity Assessment Details Patient Name: Date of Service: Chad [name], Chad [name] 06/30/2022 9:45 A M Medical Record Number: [id number] Patient Account Number: [id number] Date of Birth/Sex: Treating RN: [birth year] (61 y.o. [name] Primary Care Massa Pe: [name] Other Clinician: [name] Referring Yui Mulvaney: Treating Nettie Cromwell/Extender: [name] in Treatment: 29 Vascular Assessment Pulses: Dorsalis Pedis Palpable: [Left:Yes] Electronic Signature(s) Signed: 06/30/2022 2:54:45 PM By: Gretta Cool, BSN, RN, CWS, Kim RN, BSN Signed: 06/30/2022 4:50:46 PM By: [name] Entered By: [name] on 06/30/2022 10:22:11 -------------------------------------------------------------------------------- Multi Wound Chart Details Patient Name: Date of Service: Chad [name], Chad [name] 06/30/2022 9:45 A M Medical Record Number: [id number] Patient Account Number: [id number] Date of Birth/Sex: Treating RN: 1961/06/28 (61 y.o.  [name], [name] Primary Care Daney Moor: [name] Other Clinician: [name] Referring Ahmani Prehn: Treating Devaunte Gasparini/Extender: [name] in Treatment: 29 Vital Signs Height(in): 22 Pulse(bpm): 101 Weight(lbs): 150 Blood Pressure(mmHg): 148/77 Body Mass Index(BMI): 22.1 Temperature(F): 97.8 Respiratory Rate(breaths/min): 18 [12:Photos: No Photos Left, Distal, Medial Ankle Wound Location: Gradually Appeared Wounding Event: Venous Leg Ulcer Primary Etiology: Anemia, Chronic Obstructive Comorbid History: Pulmonary Disease (COPD), Coronary Artery Disease, Peripheral Arterial Disease,  Peripheral Venous Disease, Hepatitis C, Osteoarthritis, Neuropathy, Received Chemotherapy 07/12/2019  Date Acquired: 64 Weeks of Treatment: Open Wound Status: No Wound Recurrence: 0.1x0.1x0.1 Measurements L x W x D (cm) 0.008 A (cm) : rea 0.001 Volume  (cm) : 99.90% % Reduction in Area: 99.90% % Reduction in Volume: Full Thickness Without Exposed Classification: Support Structures None Present Exudate Amount: Distinct, outline attached Wound Margin: None Present (0%) Granulation Amount:] [N/A:N/A N/A  N/A N/A N/A N/A N/A N/A N/A N/A N/A N/A N/A N/A N/A N/A N/A N/A] [name] ([id number]) [12:None Present (0%) Necrotic Amount: Fat Layer (Subcutaneous Tissue): Yes N/A Exposed Structures: Fascia: No Tendon: No Muscle: No Joint: No Bone: No Large (67-100%) Epithelialization:] [N/A:N/A N/A] Treatment Notes Electronic Signature(s) Signed: 06/30/2022 4:50:46 PM By: [name] Entered By: [name] on 06/30/2022 10:22:17 -------------------------------------------------------------------------------- Multi-Disciplinary Care Plan Details Patient Name: Date of Service: Chad [name] 06/30/2022 9:45 A M Medical Record Number: [id number] Patient Account Number: [id number] Date of Birth/Sex: Treating RN: [birth year] (61 y.o. [name] Primary Care Izel Hochberg: [name] Other Clinician: [name] Referring Lyana Asbill: Treating Jaterrius Ricketson/Extender: [name] in Treatment: 29 Active Inactive Venous Leg Ulcer Nursing Diagnoses: Knowledge deficit related to disease process and management Goals: Patient will maintain optimal edema control Date Initiated: 12/06/2021 Date Inactivated: 12/27/2021 Target Resolution Date: 01/03/2022 Goal Status: Met Patient/caregiver will verbalize understanding of disease process and disease management Date Initiated: 12/06/2021 Target Resolution Date: 05/29/2022 Goal Status: Active Interventions: Assess peripheral edema status every visit. Compression as ordered Notes: Electronic Signature(s) Signed: 06/30/2022 2:54:45 PM By: Gretta Cool, BSN, RN, CWS, Kim RN, BSN Signed: 06/30/2022 4:50:46 PM By: [name] Entered By: [name] on 06/30/2022 13:10:44 [name] ([id number]) [id number] Page 6 of 8 -------------------------------------------------------------------------------- Pain Assessment Details Patient Name: Date of Service: Chad [name], Chad [name] 06/30/2022 9:45 A M Medical Record Number: [id number] Patient Account Number: [id number] Date of Birth/Sex: Treating RN: [birth year] (61 y.o. [name] Primary Care Cas Tracz: [name] Other Clinician: [name] Referring Fenris Cauble: Treating October Peery/Extender: [name] in Treatment: 29 Active Problems Location of Pain Severity and Description of Pain Patient Has Paino No Site Locations Pain Management and Medication Current Pain Management: Electronic Signature(s) Signed: 06/30/2022 2:54:45 PM By: Gretta Cool, BSN, RN, CWS, Kim RN, BSN Signed: 06/30/2022 4:50:46 PM By: [name] Entered By: [name] on 06/30/2022 10:13:21 -------------------------------------------------------------------------------- Patient/Caregiver Education Details Patient Name: Date of  Service: Chad [name] 12/4/2023andnbsp9:45 A M Medical Record Number: [id number] Patient Account Number: [id number] Date of Birth/Gender: Treating RN: 1961-05-20 (61 y.o. [name] Primary Care Physician: [name] Other Clinician: [name] Referring Physician: Treating Physician/Extender: [name] in Treatment: [address] [place] ([id number]) [id number] Page 7 of 8 Education Assessment Education Provided To: Patient Education Topics Provided Wound/Skin Impairment: Handouts: Other: continue wound care as directed Methods: Explain/Verbal Responses: State content correctly Electronic Signature(s) Signed: 06/30/2022 4:50:46 PM By: [name] Entered By: [name] on 06/30/2022 13:10:39 -------------------------------------------------------------------------------- Wound Assessment Details Patient Name: Date of Service: Chad [name], Chad [name] 06/30/2022 9:45 A M Medical Record Number: [id number] Patient Account Number: [id number] Date of Birth/Sex: Treating RN: 08/28/1960 (61 y.o. [name] Primary Care Merl Guardino: [name] Other Clinician: [name] Referring Roselee Tayloe: Treating Dheeraj Hail/Extender: [name] in Treatment: 29 Wound Status Wound Number: 12 Primary Venous Leg Ulcer Etiology: Wound Location: Left, Distal, Medial Ankle Wound Open Wounding Event: Gradually Appeared Status: Date Acquired: 07/12/2019 Comorbid Anemia, Chronic Obstructive Pulmonary Disease (COPD), Coronary Weeks Of Treatment: 29 History: Artery Disease, Peripheral Arterial Disease,  Peripheral Venous Clustered Wound: No Disease, Hepatitis C, Osteoarthritis, Neuropathy, Received Chemotherapy Wound Measurements Length: (cm) 0.1 Width: (cm) 0.1 Depth: (cm) 0.1 Area: (cm) 0.008 Volume: (cm) 0.001 % Reduction in Area: 99.9% % Reduction in Volume: 99.9% Epithelialization: Large  (67-100%) Tunneling: No Undermining: No Wound Description Classification: Full Thickness Without Exposed Support Wound Margin: Distinct, outline attached Exudate Amount: None Present Structures Foul Odor After Cleansing: No Slough/Fibrino No Wound Bed Granulation Amount: None Present (0%) Exposed Structure Necrotic Amount: None Present (0%) Fascia Exposed: No Fat Layer (Subcutaneous Tissue) Exposed: Yes Tendon Exposed: No Muscle Exposed: No Joint Exposed: No Bone Exposed: No Treatment Notes Chad [name], Chad [name] ([id number]) [id number] Page 8 of 8 Wound #12 (Ankle) Wound Laterality: Left, Medial, Distal Cleanser Soap and Water Discharge Instruction: Gently cleanse wound with antibacterial soap, rinse and pat dry prior to dressing wounds Wound Cleanser Discharge Instruction: Wash your hands with soap and water. Remove old dressing, discard into plastic bag and place into trash. Cleanse the wound with Wound Cleanser prior to applying a clean dressing using gauze sponges, not tissues or cotton balls. Do not scrub or use excessive force. Pat dry using gauze sponges, not tissue or cotton balls. Peri-Wound Care Topical Gentell AandD+E Ointment, Jar 16 (oz) Primary Dressing Secondary Dressing ABD Pad 5x9 (in/in) Discharge Instruction: Cover with ABD pad Secured With Medipore T - 65M Medipore H Soft Cloth Surgical T ape ape, 2x2 (in/yd) Conform 4'' - Conforming Stretch Gauze Bandage 4x75 (in/in) Discharge Instruction: Apply as directed Tubigrip Size D, 3x10 (in/yd) Discharge Instruction: single layer Compression Wrap Compression Stockings Add-Ons Electronic Signature(s) Signed: 06/30/2022 2:54:45 PM By: Gretta Cool, BSN, RN, CWS, Kim RN, BSN Signed: 06/30/2022 4:50:46 PM By: [name] Entered By: [name] on 06/30/2022 10:21:37 -------------------------------------------------------------------------------- [place] Details Patient Name: Date of  Service: Chad [name] 06/30/2022 9:45 A M Medical Record Number: [id number] Patient Account Number: [id number] Date of Birth/Sex: Treating RN: 07-22-1961 (61 y.o. [name], [name] Primary Care Kamren Heintzelman: [name] Other Clinician: [name] Referring Christabell Loseke: Treating Rhiann Boucher/Extender: [name] in Treatment: 29 Vital Signs Time Taken: 10:11 Temperature (F): 97.8 Height (in): 69 Pulse (bpm): 101 Weight (lbs): 150 Respiratory Rate (breaths/min): 18 Body Mass Index (BMI): 22.1 Blood Pressure (mmHg): 148/77 Reference Range: 80 - 120 mg / dl Electronic Signature(s) Signed: 06/30/2022 4:50:46 PM By: [name] Entered By: [name] on 06/30/2022 10:13:17

## 2022-07-01 ENCOUNTER — Inpatient Hospital Stay: Payer: Medicaid Other | Attending: Internal Medicine

## 2022-07-01 ENCOUNTER — Telehealth: Payer: Self-pay | Admitting: Hospice and Palliative Medicine

## 2022-07-01 ENCOUNTER — Inpatient Hospital Stay: Payer: Medicaid Other

## 2022-07-01 ENCOUNTER — Inpatient Hospital Stay (HOSPITAL_BASED_OUTPATIENT_CLINIC_OR_DEPARTMENT_OTHER): Payer: Medicaid Other | Admitting: Hospice and Palliative Medicine

## 2022-07-01 ENCOUNTER — Encounter: Payer: Self-pay | Admitting: Internal Medicine

## 2022-07-01 ENCOUNTER — Inpatient Hospital Stay (HOSPITAL_BASED_OUTPATIENT_CLINIC_OR_DEPARTMENT_OTHER): Payer: Medicaid Other | Admitting: Internal Medicine

## 2022-07-01 VITALS — BP 110/71 | HR 89 | Temp 98.0°F | Resp 18 | Ht 69.0 in | Wt 170.0 lb

## 2022-07-01 DIAGNOSIS — Z8546 Personal history of malignant neoplasm of prostate: Secondary | ICD-10-CM | POA: Diagnosis not present

## 2022-07-01 DIAGNOSIS — Z7189 Other specified counseling: Secondary | ICD-10-CM

## 2022-07-01 DIAGNOSIS — F17210 Nicotine dependence, cigarettes, uncomplicated: Secondary | ICD-10-CM | POA: Insufficient documentation

## 2022-07-01 DIAGNOSIS — Z8551 Personal history of malignant neoplasm of bladder: Secondary | ICD-10-CM | POA: Insufficient documentation

## 2022-07-01 DIAGNOSIS — C661 Malignant neoplasm of right ureter: Secondary | ICD-10-CM

## 2022-07-01 DIAGNOSIS — Z515 Encounter for palliative care: Secondary | ICD-10-CM

## 2022-07-01 DIAGNOSIS — Z79899 Other long term (current) drug therapy: Secondary | ICD-10-CM | POA: Insufficient documentation

## 2022-07-01 DIAGNOSIS — D509 Iron deficiency anemia, unspecified: Secondary | ICD-10-CM | POA: Diagnosis not present

## 2022-07-01 DIAGNOSIS — C187 Malignant neoplasm of sigmoid colon: Secondary | ICD-10-CM | POA: Insufficient documentation

## 2022-07-01 DIAGNOSIS — Z5112 Encounter for antineoplastic immunotherapy: Secondary | ICD-10-CM | POA: Diagnosis present

## 2022-07-01 DIAGNOSIS — Z8673 Personal history of transient ischemic attack (TIA), and cerebral infarction without residual deficits: Secondary | ICD-10-CM | POA: Insufficient documentation

## 2022-07-01 DIAGNOSIS — D500 Iron deficiency anemia secondary to blood loss (chronic): Secondary | ICD-10-CM

## 2022-07-01 LAB — CBC WITH DIFFERENTIAL/PLATELET
Abs Immature Granulocytes: 0.01 10*3/uL (ref 0.00–0.07)
Basophils Absolute: 0.1 10*3/uL (ref 0.0–0.1)
Basophils Relative: 1 %
Eosinophils Absolute: 0.4 10*3/uL (ref 0.0–0.5)
Eosinophils Relative: 7 %
HCT: 39.6 % (ref 39.0–52.0)
Hemoglobin: 12.5 g/dL — ABNORMAL LOW (ref 13.0–17.0)
Immature Granulocytes: 0 %
Lymphocytes Relative: 23 %
Lymphs Abs: 1.4 10*3/uL (ref 0.7–4.0)
MCH: 24.8 pg — ABNORMAL LOW (ref 26.0–34.0)
MCHC: 31.6 g/dL (ref 30.0–36.0)
MCV: 78.4 fL — ABNORMAL LOW (ref 80.0–100.0)
Monocytes Absolute: 0.7 10*3/uL (ref 0.1–1.0)
Monocytes Relative: 10 %
Neutro Abs: 3.8 10*3/uL (ref 1.7–7.7)
Neutrophils Relative %: 59 %
Platelets: 173 10*3/uL (ref 150–400)
RBC: 5.05 MIL/uL (ref 4.22–5.81)
RDW: 18.1 % — ABNORMAL HIGH (ref 11.5–15.5)
WBC: 6.3 10*3/uL (ref 4.0–10.5)
nRBC: 0 % (ref 0.0–0.2)

## 2022-07-01 LAB — COMPREHENSIVE METABOLIC PANEL
ALT: 26 U/L (ref 0–44)
AST: 41 U/L (ref 15–41)
Albumin: 3.3 g/dL — ABNORMAL LOW (ref 3.5–5.0)
Alkaline Phosphatase: 133 U/L — ABNORMAL HIGH (ref 38–126)
Anion gap: 7 (ref 5–15)
BUN: 20 mg/dL (ref 8–23)
CO2: 26 mmol/L (ref 22–32)
Calcium: 8.2 mg/dL — ABNORMAL LOW (ref 8.9–10.3)
Chloride: 103 mmol/L (ref 98–111)
Creatinine, Ser: 1.11 mg/dL (ref 0.61–1.24)
GFR, Estimated: >60 mL/min (ref >60)
Glucose, Bld: 86 mg/dL (ref 70–99)
Potassium: 4.3 mmol/L (ref 3.5–5.1)
Sodium: 136 mmol/L (ref 135–145)
Total Bilirubin: 0.5 mg/dL (ref 0.3–1.2)
Total Protein: 7.2 g/dL (ref 6.5–8.1)

## 2022-07-01 LAB — TSH: TSH: 3.296 u[IU]/mL (ref 0.350–4.500)

## 2022-07-01 MED ORDER — SODIUM CHLORIDE 0.9 % IV SOLN
Freq: Once | INTRAVENOUS | Status: AC
  Filled 2022-07-01: qty 250

## 2022-07-01 MED ORDER — SODIUM CHLORIDE 0.9 % IV SOLN
200.0000 mg | Freq: Once | INTRAVENOUS | Status: AC
  Administered 2022-07-01: 200 mg via INTRAVENOUS
  Filled 2022-07-01: qty 8

## 2022-07-01 MED ORDER — HEPARIN SOD (PORK) LOCK FLUSH 100 UNIT/ML IV SOLN
500.0000 [IU] | Freq: Once | INTRAVENOUS | Status: AC | PRN
  Administered 2022-07-01: 500 [IU]
  Filled 2022-07-01: qty 5

## 2022-07-01 MED ORDER — SODIUM CHLORIDE 0.9 % IV SOLN
200.0000 mg | Freq: Once | INTRAVENOUS | Status: AC
  Administered 2022-07-01: 200 mg via INTRAVENOUS
  Filled 2022-07-01: qty 10

## 2022-07-01 NOTE — Progress Notes (Signed)
Patient denies new problems/concerns today.    Medication reconcilled with list provided by [facility].  Facility current medication list does not include Levothyroxine 86mg QD, Pantoprazole 40 mg QD, Vitamin D 225m QD.  New medication on facility list not included on [facility] med list is Plavix 75 mg QD.

## 2022-07-01 NOTE — Progress Notes (Signed)
[place]  [phone] [phone]   Name: Chad [name] Date: 07/01/2022 MRN: [id number]  DOB: Oct 29, 1960  Patient Care Team: Demetrius Charity, MD as PCP - General (Family Medicine) Cammie Sickle, MD as Consulting Physician (Internal Medicine) Bary Castilla Forest Gleason, MD as Consulting Physician (General Surgery)    REASON FOR CONSULTATION: Rutledge Selsor is a 61 y.o. male with multiple medical problems including h/o CVA with residual L. Sided weakness, dysphagia, COPD, Lynch syndrome, history of stage III colon cancer s/p hemicolectomy with recurrent sigmoid colon cancer under surveillance and stage IV ureteral cancer diagnosed in 2019.  Patient has been on [facility] for treatment.  He is felt to have a poor prognosis with incurable ureteral cancer and will be stage III recurrent colon cancer and felt too high risk for surgical intervention.  Patient was referred to palliative care to address goals and manage ongoing symptoms.  SOCIAL HISTORY:    Patient is divorced. He lived in [place] but moved here in 2019. Patient has a son, who lives in [place]. He has four other children in [place] and [place].  Patient is a resident at [facility] for the past 6 years.  ADVANCE DIRECTIVES:  Not on file  CODE STATUS: DNR/DNI  PAST MEDICAL HISTORY: Past Medical History:  Diagnosis Date   Anemia    Anxiety    ARF (acute respiratory failure) (HCC)    Bladder cancer (HCC)    COPD (chronic obstructive pulmonary disease) ([place])    Depression    Dysphagia    Family history of colon cancer    Family history of kidney cancer    Family history of leukemia    Family history of prostate cancer    GERD (gastroesophageal reflux disease)    Hepatitis    chronic hep c   Hydronephrosis    Hydronephrosis with ureteral stricture    Hyperlipidemia    Knee pain    Left   Malignant neoplasm of colon (HCC)    Nerve pain     Peripheral vascular disease ([place])    Prostate cancer ([place])    Stroke ([place])    Ureteral cancer, right ([place])    Urinary frequency    Venous hypertension of both lower extremities     PAST SURGICAL HISTORY:  Past Surgical History:  Procedure Laterality Date   COLON SURGERY     En bloc extended right hemicolectomy 07/2017   COLONOSCOPY WITH PROPOFOL N/A 11/06/2020   Procedure: COLONOSCOPY WITH PROPOFOL;  Surgeon: Jonathon Bellows, MD;  Location: [facility] ENDOSCOPY;  Service: Gastroenterology;  Laterality: N/A;   COLONOSCOPY WITH PROPOFOL N/A 07/31/2021   Procedure: COLONOSCOPY WITH PROPOFOL;  Surgeon: Cael Bellow, MD;  Location: [facility] ENDOSCOPY;  Service: Endoscopy;  Laterality: N/A;   CYSTOSCOPY W/ RETROGRADES Right 08/30/2018   Procedure: CYSTOSCOPY WITH RETROGRADE PYELOGRAM;  Surgeon: Hollice Espy, MD;  Location: [facility] ORS;  Service: Urology;  Laterality: Right;   CYSTOSCOPY WITH STENT PLACEMENT Right 04/25/2018   Procedure: CYSTOSCOPY WITH STENT PLACEMENT;  Surgeon: Hollice Espy, MD;  Location: [facility] ORS;  Service: Urology;  Laterality: Right;   CYSTOSCOPY WITH STENT PLACEMENT Right 08/30/2018   Procedure: [place] Exchange;  Surgeon: Hollice Espy, MD;  Location: [facility] ORS;  Service: Urology;  Laterality: Right;   CYSTOSCOPY WITH STENT PLACEMENT Right 03/07/2019   Procedure: CYSTOSCOPY WITH STENT Exchange;  Surgeon: Hollice Espy, MD;  Location: [facility] ORS;  Service: Urology;  Laterality: Right;  CYSTOSCOPY WITH STENT PLACEMENT Right 11/21/2019   Procedure: CYSTOSCOPY WITH STENT Exchange;  Surgeon: Hollice Espy, MD;  Location: [facility] ORS;  Service: Urology;  Laterality: Right;   LOWER EXTREMITY ANGIOGRAPHY Left 05/23/2019   Procedure: LOWER EXTREMITY ANGIOGRAPHY;  Surgeon: Algernon Huxley, MD;  Location: [place] CV LAB;  Service: Cardiovascular;  Laterality: Left;   LOWER EXTREMITY ANGIOGRAPHY Right 05/30/2019   Procedure: LOWER EXTREMITY ANGIOGRAPHY;  Surgeon: Algernon Huxley,  MD;  Location: [place] CV LAB;  Service: Cardiovascular;  Laterality: Right;   LOWER EXTREMITY ANGIOGRAPHY Right 02/13/2020   Procedure: LOWER EXTREMITY ANGIOGRAPHY;  Surgeon: Algernon Huxley, MD;  Location: [place] CV LAB;  Service: Cardiovascular;  Laterality: Right;   LOWER EXTREMITY ANGIOGRAPHY Left 02/20/2020   Procedure: LOWER EXTREMITY ANGIOGRAPHY;  Surgeon: Algernon Huxley, MD;  Location: [place] CV LAB;  Service: Cardiovascular;  Laterality: Left;   PORTA CATH INSERTION N/A 02/28/2019   Procedure: PORTA CATH INSERTION;  Surgeon: Algernon Huxley, MD;  Location: [place] CV LAB;  Service: Cardiovascular;  Laterality: N/A;   tumor removed       HEMATOLOGY/ONCOLOGY HISTORY:  Oncology History Overview Note  # SEP-OCT 2019-right renal pelvis/ ureteral [cytology positive HIGH grade urothelial carcinoma [Dr.Brandon]    # NOV 24th 2019-Keytruda [consent]  # April 2022- colonoscopy [Dr.Anna;incidental PET- sigmoid uptake] ~25 mm polypoid lesion-biopsy mucinous carcinoma- s/p Dr.Byrnett [May 2022]-repeat colonoscopy in 6 months [multiple comorbidities]  #Right ureteral obstruction status post stent placement  # JAN 2019- Right Colon ca [ T4N1]  [[facility]]; NO adjuvant therapy  # Hep C/ # stroke of left side/weakness-2018 Nov [[facility]]; active smoker  DIAGNOSIS: # 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; # Colon ca- stage III  GOALS: palliative  CURRENT/MOST RECENT THERAPY: Keytruda [C]    Urothelial cancer ([place])   Initial Diagnosis   Urothelial cancer ([place])   Ureteral cancer, right ([place])  05/26/2018 Initial Diagnosis   Ureteral cancer, right ([place])   06/15/2018 -  Chemotherapy   Patient is on Treatment Plan : BLADDER Pembrolizumab (200) q21d     06/21/2018 - 03/17/2022 Chemotherapy   Patient is on Treatment Plan : urothelial cancer- pembrolizumab q21d       ALLERGIES:  is allergic to penicillins.  MEDICATIONS:  Current Outpatient Medications  Medication Sig Dispense Refill    acetaminophen (TYLENOL) 325 MG tablet Take 650 mg by mouth 3 (three) times daily.      aspirin EC 81 MG tablet Take 1 tablet (81 mg total) by mouth daily. 150 tablet 2   atorvastatin (LIPITOR) 10 MG tablet Take 1 tablet (10 mg total) by mouth daily. 30 tablet 11   Calcium Carbonate Antacid 600 MG chewable tablet Chew 2 tablets (1,200 mg total) by mouth daily. 90 tablet 3   carboxymethylcellulose 1 % ophthalmic solution Apply 1 drop to eye 2 (two) times daily.     cholecalciferol (VITAMIN D3) 25 MCG (1000 UNIT) tablet Take 1 tablet (1,000 Units total) by mouth daily. (Patient not taking: Reported on 07/01/2022) 1 tablet 3   clopidogrel (PLAVIX) 75 MG tablet Take 75 mg by mouth daily.     cyclobenzaprine (FLEXERIL) 10 MG tablet Take 10 mg by mouth at bedtime.     escitalopram (LEXAPRO) 5 MG tablet Take 5 mg by mouth daily.     gabapentin (NEURONTIN) 100 MG capsule Take 100 mg by mouth 3 (three) times daily.     gentamicin ointment (GARAMYCIN) 0.1 % Apply 1 application. topically 3 (  three) times daily.     levothyroxine (SYNTHROID) 25 MCG tablet Take 1 tablet (25 mcg total) by mouth daily before breakfast. (Patient not taking: Reported on 07/01/2022) 30 tablet 1   mirtazapine (REMERON) 7.5 MG tablet Take 7.5 mg by mouth at bedtime.     Oxycodone HCl 10 MG TABS Take 10 mg by mouth every 6 (six) hours as needed for pain.     pantoprazole (PROTONIX) 40 MG tablet Take 40 mg by mouth daily. (Patient not taking: Reported on 07/01/2022)     senna-docusate (SENOKOT-S) 8.6-50 MG tablet Take 1 tablet by mouth daily.     tamsulosin (FLOMAX) 0.4 MG CAPS capsule Take 1 capsule (0.4 mg total) by mouth daily after supper. 30 capsule 3   No current facility-administered medications for this visit.   Facility-Administered Medications Ordered in Other Visits  Medication Dose Route Frequency Provider Last Rate Last Admin   heparin lock flush 100 unit/mL  500 Units Intracatheter Once PRN Verlon Au, NP        pembrolizumab [facility]) 200 mg in sodium chloride 0.9 % 50 mL chemo infusion  200 mg Intravenous Once Verlon Au, NP 116 mL/hr at 07/01/22 1104 200 mg at 07/01/22 1104    VITAL SIGNS: There were no vitals taken for this visit. There were no vitals filed for this visit.   Estimated body mass index is 25.1 kg/m as calculated from the following:   Height as of an earlier encounter on 07/01/22: '5\' 9"'$  (1.753 m).   Weight as of an earlier encounter on 07/01/22: 170 lb (77.1 kg).  LABS: CBC:    Component Value Date/Time   WBC 6.3 07/01/2022 0855   HGB 12.5 (L) 07/01/2022 0855   HCT 39.6 07/01/2022 0855   PLT 173 07/01/2022 0855   MCV 78.4 (L) 07/01/2022 0855   NEUTROABS 3.8 07/01/2022 0855   LYMPHSABS 1.4 07/01/2022 0855   MONOABS 0.7 07/01/2022 0855   EOSABS 0.4 07/01/2022 0855   BASOSABS 0.1 07/01/2022 0855   Comprehensive Metabolic Panel:    Component Value Date/Time   NA 136 07/01/2022 0855   K 4.3 07/01/2022 0855   CL 103 07/01/2022 0855   CO2 26 07/01/2022 0855   BUN 20 07/01/2022 0855   CREATININE 1.11 07/01/2022 0855   GLUCOSE 86 07/01/2022 0855   CALCIUM 8.2 (L) 07/01/2022 0855   AST 41 07/01/2022 0855   ALT 26 07/01/2022 0855   ALKPHOS 133 (H) 07/01/2022 0855   BILITOT 0.5 07/01/2022 0855   PROT 7.2 07/01/2022 0855   ALBUMIN 3.3 (L) 07/01/2022 0855    RADIOGRAPHIC STUDIES: No results found.  PERFORMANCE STATUS (ECOG) : 3 - Symptomatic, >50% confined to bed  Review of Systems As noted above. Otherwise, a complete review of systems is negative.  Physical Exam General: NAD, frail appearing, thin Cardiovascular: regular rate and rhythm Pulmonary: clear ant fields Abdomen: soft, nontender, + bowel sounds Extremities: no edema, L. Knee brace Skin: no rashes Neurological: L. Sided Weakness but otherwise nonfocal  IMPRESSION: Follow-up visit.  Patient reports he is doing well.  He denies any significant changes or concerns today.  No symptomatic  complaints at present.  No changes in treatment goals at this time.   He feels well cared for at [facility].  Appreciate palliative care following him in that location to help coordinate care.  PLAN: -Continue current scope of treatment -Follow up telephone visit 1-2 months   Patient expressed understanding and was in agreement with this plan.  He also understands that He can call clinic at any time with any questions, concerns, or complaints.    Time Total: 20 minutes  Visit consisted of counseling and education dealing with the complex and emotionally intense issues of symptom management and palliative care in the setting of serious and potentially life-threatening illness.Greater than 50%  of this time was spent counseling and coordinating care related to the above assessment and plan.  Signed by: Altha Harm, PhD, NP-C [phone] (Work Cell)

## 2022-07-01 NOTE — Telephone Encounter (Signed)
Mail Reminder sent for telephone fu call with Josh Borders 2 mo.Marland KitchenKJ

## 2022-07-01 NOTE — Progress Notes (Signed)
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  Is alone/in a wheelchair.  Chad [name] 61 y.o.  male above history of stage IV-ureteral cancer/history of stage III colon cancer right side; recurrent sigmoid colon cancer [un-resected/under surveillance] and multiple other comorbidities currently on Keytruda is here for follow-up.  In the interim patient has met with Merrily Pew Borders from palliative care.  Patient denies new problems/concerns today.  Denies any worsening abdominal pain.   Any nausea vomiting or constipation.  No blood in stools or black-colored stools.  No hematuria.  Chronic shortness of breath.  Not any worse.  Review of Systems  Constitutional:  Positive for malaise/fatigue. Negative for chills, diaphoresis, fever and weight loss.  HENT:  Negative for nosebleeds and sore throat.   Eyes:  Negative for double vision.  Respiratory:  Positive for cough and shortness of breath. Negative for hemoptysis and wheezing.   Cardiovascular:  Negative for chest pain, palpitations and orthopnea.  Gastrointestinal:  Positive for constipation. Negative for abdominal pain, blood in stool, diarrhea, heartburn, melena, nausea and vomiting.  Genitourinary:  Negative for dysuria, frequency and urgency.  Musculoskeletal:  Positive for back pain and joint pain.  Skin: Negative.  Negative for itching and rash.  Neurological:  Positive for focal weakness. Negative for dizziness, tingling, weakness and headaches.       Chronic left-sided weakness upper than lower extremity.  Endo/Heme/Allergies:  Does not bruise/bleed easily.  Psychiatric/Behavioral:  Negative for depression. The patient is not nervous/anxious and does not have insomnia.      MEDICAL HISTORY:  Past Medical History:  Diagnosis Date   Anemia    Anxiety    ARF (acute respiratory failure) (HCC)    Bladder cancer (HCC)    COPD (chronic obstructive pulmonary disease) ([place])    Depression    Dysphagia    Family history of colon cancer    Family history of kidney cancer    Family history of leukemia    Family history of prostate cancer    GERD (gastroesophageal reflux disease)    Hepatitis    chronic hep c   Hydronephrosis  Hydronephrosis with ureteral stricture    Hyperlipidemia    Knee pain    Left   Malignant neoplasm of colon (HCC)    Nerve pain    Peripheral vascular disease (HCC)    Prostate cancer (HCC)    Stroke (HCC)    Ureteral cancer, right ([place])    Urinary frequency    Venous hypertension  of both lower extremities     SURGICAL HISTORY: Past Surgical History:  Procedure Laterality Date   COLON SURGERY     En bloc extended right hemicolectomy 07/2017   COLONOSCOPY WITH PROPOFOL N/A 11/06/2020   Procedure: COLONOSCOPY WITH PROPOFOL;  Surgeon: Jonathon Bellows, MD;  Location: [facility] ENDOSCOPY;  Service: Gastroenterology;  Laterality: N/A;   COLONOSCOPY WITH PROPOFOL N/A 07/31/2021   Procedure: COLONOSCOPY WITH PROPOFOL;  Surgeon: Nylan Bellow, MD;  Location: [facility] ENDOSCOPY;  Service: Endoscopy;  Laterality: N/A;   CYSTOSCOPY W/ RETROGRADES Right 08/30/2018   Procedure: CYSTOSCOPY WITH RETROGRADE PYELOGRAM;  Surgeon: Hollice Espy, MD;  Location: [facility] ORS;  Service: Urology;  Laterality: Right;   CYSTOSCOPY WITH STENT PLACEMENT Right 04/25/2018   Procedure: CYSTOSCOPY WITH STENT PLACEMENT;  Surgeon: Hollice Espy, MD;  Location: [facility] ORS;  Service: Urology;  Laterality: Right;   CYSTOSCOPY WITH STENT PLACEMENT Right 08/30/2018   Procedure: [place] Exchange;  Surgeon: Hollice Espy, MD;  Location: [facility] ORS;  Service: Urology;  Laterality: Right;   CYSTOSCOPY WITH STENT PLACEMENT Right 03/07/2019   Procedure: CYSTOSCOPY WITH STENT Exchange;  Surgeon: Hollice Espy, MD;  Location: [facility] ORS;  Service: Urology;  Laterality: Right;   CYSTOSCOPY WITH STENT PLACEMENT Right 11/21/2019   Procedure: CYSTOSCOPY WITH STENT Exchange;  Surgeon: Hollice Espy, MD;  Location: [facility] ORS;  Service: Urology;  Laterality: Right;   LOWER EXTREMITY ANGIOGRAPHY Left 05/23/2019   Procedure: LOWER EXTREMITY ANGIOGRAPHY;  Surgeon: Algernon Huxley, MD;  Location: [place] CV LAB;  Service: Cardiovascular;  Laterality: Left;   LOWER EXTREMITY ANGIOGRAPHY Right 05/30/2019   Procedure: LOWER EXTREMITY ANGIOGRAPHY;  Surgeon: Algernon Huxley, MD;  Location: [place] CV LAB;  Service: Cardiovascular;  Laterality: Right;   LOWER EXTREMITY ANGIOGRAPHY Right 02/13/2020   Procedure: LOWER EXTREMITY  ANGIOGRAPHY;  Surgeon: Algernon Huxley, MD;  Location: [place] CV LAB;  Service: Cardiovascular;  Laterality: Right;   LOWER EXTREMITY ANGIOGRAPHY Left 02/20/2020   Procedure: LOWER EXTREMITY ANGIOGRAPHY;  Surgeon: Algernon Huxley, MD;  Location: [place] CV LAB;  Service: Cardiovascular;  Laterality: Left;   PORTA CATH INSERTION N/A 02/28/2019   Procedure: PORTA CATH INSERTION;  Surgeon: Algernon Huxley, MD;  Location: [place] CV LAB;  Service: Cardiovascular;  Laterality: N/A;   tumor removed       SOCIAL HISTORY: Social History   Socioeconomic History   Marital status: Single    Spouse name: Not on file   Number of children: Not on file   Years of education: Not on file   Highest education level: Not on file  Occupational History   Not on file  Tobacco Use   Smoking status: Every Day    Packs/day: 1.00    Types: Cigarettes   Smokeless tobacco: Never  Vaping Use   Vaping Use: Never used  Substance and Sexual Activity   Alcohol use: Not Currently   Drug use: Not Currently   Sexual activity: Not Currently  Other Topics Concern   Not on file  Social History Narrative    used to live [place]; moved  To  [place]- end of April 2019; in Nursing home; 1pp/day; quit alcohol. 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        Family- dad- prostate ca [at 47y]; brother- 47 died of prostate cancer; brother- 3- no cancers [[place]]; sonGerald Stabs [[place]];[name]-32y prostate ca [facility] [place]]; daughter- 53 [[place]]; another daughter 28 [[place]/addict]. will refer genetics counseling. Given MSI- abnormal; highly suspicious of Lynch syndrome.  Patient's son [name] aware of high possible lynch syndrome.   Social Determinants of Health   Financial Resource Strain: Not on file  Food Insecurity: Not on file  Transportation Needs: Not on file  Physical Activity: Not on file  Stress: Not on file  Social Connections: Not on file  Intimate Partner Violence: Not on file    FAMILY HISTORY: Family History  Problem  Relation Age of Onset   Prostate cancer Father 33   Cancer Brother 2       unsure type   Cancer Paternal Uncle        unsure type   Cancer Maternal Grandmother        unsure type   Cancer Paternal Grandmother        unsure type   Kidney cancer Paternal Grandfather    Cancer Other        unsure types   Leukemia Son    Cancer Son        other cancers, possibly colon    ALLERGIES:  is allergic to penicillins.  MEDICATIONS:  Current Outpatient Medications  Medication Sig Dispense Refill   acetaminophen (TYLENOL) 325 MG tablet Take 650 mg by mouth 3 (three) times daily.      aspirin EC 81 MG tablet Take 1 tablet (81 mg total) by mouth daily. 150 tablet 2   atorvastatin (LIPITOR) 10 MG tablet Take 1 tablet (10 mg total) by mouth daily. 30 tablet 11   Calcium Carbonate Antacid 600 MG chewable tablet Chew 2 tablets (1,200 mg total) by mouth daily. 90 tablet 3   carboxymethylcellulose 1 % ophthalmic solution Apply 1 drop to eye 2 (two) times daily.     clopidogrel (PLAVIX) 75 MG tablet Take 75 mg by mouth daily.     cyclobenzaprine (FLEXERIL) 10 MG tablet Take 10 mg by mouth at bedtime.     escitalopram (LEXAPRO) 5 MG tablet Take 5 mg by mouth daily.     gabapentin (NEURONTIN) 100 MG capsule Take 100 mg by mouth 3 (three) times daily.     gentamicin ointment (GARAMYCIN) 0.1 % Apply 1 application. topically 3 (three) times daily.     mirtazapine (REMERON) 7.5 MG tablet Take 7.5 mg by mouth at bedtime.     Oxycodone HCl 10 MG TABS Take 10 mg by mouth every 6 (six) hours as needed for pain.     senna-docusate (SENOKOT-S) 8.6-50 MG tablet Take 1 tablet by mouth daily.     tamsulosin (FLOMAX) 0.4 MG CAPS capsule Take 1 capsule (0.4 mg total) by mouth daily after supper. 30 capsule 3   cholecalciferol (VITAMIN D3) 25 MCG (1000 UNIT) tablet Take 1 tablet (1,000 Units total) by mouth daily. (Patient not taking: Reported on 07/01/2022) 1 tablet 3   levothyroxine (SYNTHROID) 25 MCG tablet Take 1  tablet (25 mcg total) by mouth daily before breakfast. (Patient not taking: Reported on 07/01/2022) 30 tablet 1   pantoprazole (PROTONIX) 40 MG tablet Take 40 mg by mouth daily. (Patient not taking: Reported on 07/01/2022)     No current facility-administered medications for  this visit.      Marland Kitchen  PHYSICAL EXAMINATION: ECOG PERFORMANCE STATUS: 1 - Symptomatic but completely ambulatory  Vitals:   07/01/22 0900  BP: 110/71  Pulse: 89  Resp: 18  Temp: 98 F (36.7 C)   Filed Weights   07/01/22 0900  Weight: 170 lb (77.1 kg)       Physical Exam HENT:     Head: Normocephalic and atraumatic.     Mouth/Throat:     Pharynx: No oropharyngeal exudate.  Eyes:     Pupils: Pupils are equal, round, and reactive to light.  Cardiovascular:     Rate and Rhythm: Normal rate and regular rhythm.  Pulmonary:     Effort: No respiratory distress.     Breath sounds: No wheezing.  Abdominal:     General: Bowel sounds are normal. There is no distension.     Palpations: Abdomen is soft. There is no mass.     Tenderness: There is no abdominal tenderness. There is no guarding or rebound.  Musculoskeletal:        General: No tenderness. Normal range of motion.     Cervical back: Normal range of motion and neck supple.  Skin:    General: Skin is warm.     Comments: Bilateral lower extremity ulcerations noted.  Pulses intact bilaterally.  Cystlike lesion noted right elbow-nontender.  No erythema noted.  Neurological:     Mental Status: He is oriented to person, place, and time.     Comments: Sleepy but easily arousable.  Chronic weakness left upper and lower extremity.  Psychiatric:        Mood and Affect: Affect normal.      LABORATORY DATA:  I have reviewed the data as listed Lab Results  Component Value Date   WBC 6.3 07/01/2022   HGB 12.5 (L) 07/01/2022   HCT 39.6 07/01/2022   MCV 78.4 (L) 07/01/2022   PLT 173 07/01/2022   Recent Labs    05/20/22 0913 06/10/22 1002  07/01/22 0855  NA 137 135 136  K 3.9 3.7 4.3  CL 109 105 103  CO2 _0 GLUCOSE 125* 159* 86  BUN _1 CREATININE 1.02 1.02 1.11  CALCIUM 8.0* 8.3* 8.2*  GFRNONAA >60 >60 >60  PROT 7.3 7.2 7.2  ALBUMIN 3.3* 3.2* 3.3*  AST 39 44* 41  ALT _2 ALKPHOS 131* 127* 133*  BILITOT 0.3 0.4 0.5    ASSESSMENT & PLAN:   Ureteral cancer, right (HCC) # High-grade urothelial cancer/cytology; likely of the right renal pelvis /upper ureter. On keytruda-SEP, 22nd 2023-radiologically mild progressive disease noted in the proximal right ureter; however overall clinically stable. See below-regarding sigmoid cancer- will repeat scan at next visit.   # Proceed with  [place] today; Labs today reviewed; Labs today reviewed;  acceptable for treatment today. STABLE. TSH AUG 2023--WNL.   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 SEP 22nd, 2023 CT scan- Focal wall thickening in the proximal sigmoid colon; with worsening of pericolonic lymph nodes . ; s/p evaluation with Dr. Bary Castilla- over all clinically STABLE.  # Iron deficiency anemia-hemoglobin 11-12; FEB 2023- iron studies-7/ferritin-18 LOW; s/p  venofer-hemoglobin 12.3.STABLE.  # Hypocalcemia- Mild- on Ca+vit D; JUNE 2023- 25-OH vit D levels: 30. STABLE.   #DISPOSITION:  #  Keytruda today; Venofer today-   # follow up in 3 weeks-; MD- labs-cbc/cmp; Keytruda; venofer- -Dr.B.  All questions were answered. The patient knows to call the clinic with any problems, questions or concerns.    Cammie Sickle, MD 07/01/2022 12:26 PM

## 2022-07-01 NOTE — Assessment & Plan Note (Signed)
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 On keytruda-SEP, 22nd 2023-radiologically mild progressive disease noted in the proximal right ureter; however overall clinically stable. See below-regarding sigmoid cancer- will repeat scan at next visit.   # Proceed with  [place] today; Labs today reviewed; Labs today reviewed;  acceptable for treatment today. STABLE. TSH AUG 2023--WNL.   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 SEP 22nd, 2023 CT scan- Focal wall thickening in the proximal sigmoid colon; with worsening of pericolonic lymph nodes . ; s/p evaluation with Dr. Bary Castilla- over all clinically STABLE.  # Iron deficiency anemia-hemoglobin 11-12; FEB 2023- iron studies-7/ferritin-18 LOW; s/p  venofer-hemoglobin 12.3.STABLE.  # Hypocalcemia- Mild- on Ca+vit D; JUNE 2023- 25-OH vit D levels: 30. STABLE.   #DISPOSITION:  #  Keytruda today; Venofer today-   # follow up in 3 weeks-; MD- labs-cbc/cmp; Keytruda; venofer- -Dr.B.

## 2022-07-01 NOTE — Patient Instructions (Signed)
[facility]  Discharge Instructions: Thank you for choosing [facility] to provide your oncology and hematology care.  If you have a lab appointment with the Cancer Center, please go directly to the Cancer Center and check in at the registration area.  Wear comfortable clothing and clothing appropriate for easy access to any Portacath or PICC line.   We strive to give you quality time with your provider. You may need to reschedule your appointment if you arrive late (15 or more minutes).  Arriving late affects you and other patients whose appointments are after yours.  Also, if you miss three or more appointments without notifying the office, you may be dismissed from the clinic at the provider's discretion.      For prescription refill requests, have your pharmacy contact our office and allow 72 hours for refills to be completed.       To help prevent nausea and vomiting after your treatment, we encourage you to take your nausea medication as directed.  BELOW ARE SYMPTOMS THAT SHOULD BE REPORTED IMMEDIATELY: *FEVER GREATER THAN 100.4 F (38 C) OR HIGHER *CHILLS OR SWEATING *NAUSEA AND VOMITING THAT IS NOT CONTROLLED WITH YOUR NAUSEA MEDICATION *UNUSUAL SHORTNESS OF BREATH *UNUSUAL BRUISING OR BLEEDING *URINARY PROBLEMS (pain or burning when urinating, or frequent urination) *BOWEL PROBLEMS (unusual diarrhea, constipation, pain near the anus) TENDERNESS IN MOUTH AND THROAT WITH OR WITHOUT PRESENCE OF ULCERS (sore throat, sores in mouth, or a toothache) UNUSUAL RASH, SWELLING OR PAIN  UNUSUAL VAGINAL DISCHARGE OR ITCHING   Items with * indicate a potential emergency and should be followed up as soon as possible or go to the Emergency Department if any problems should occur.  Please show the CHEMOTHERAPY ALERT CARD or IMMUNOTHERAPY ALERT CARD at check-in to the Emergency Department and triage nurse.  Should you have questions after your  visit or need to cancel or reschedule your appointment, please contact [facility]  [phone] and follow the prompts.  Office hours are 8:00 a.m. to 4:30 p.m. Monday - Friday. Please note that voicemails left after 4:00 p.m. may not be returned until the following business day.  We are closed weekends and major holidays. You have access to a nurse at all times for urgent questions. Please call the main number to the clinic [phone] and follow the prompts.  For any non-urgent questions, you may also contact your provider using MyChart. We now offer e-Visits for anyone 18 and older to request care online for non-urgent symptoms. For details visit [email or web address].   Also download the MyChart app! Go to the app store, search "MyChart", open the app, select [facility], and log in with your MyChart username and password.  Masks are optional in the cancer centers. If you would like for your care team to wear a mask while they are taking care of you, please let them know. For doctor visits, patients may have with them one support person who is at least 63 years old. At this time, visitors are not allowed in the infusion area.   

## 2022-07-02 LAB — T4: T4, Total: 9.4 ug/dL (ref 4.5–12.0)

## 2022-07-14 ENCOUNTER — Encounter: Payer: Medicaid Other | Admitting: Physician Assistant

## 2022-07-14 DIAGNOSIS — I87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 hypertension (idiopathic) with ulcer and inflammation of left lower extremity: Secondary | ICD-10-CM | POA: Diagnosis not present

## 2022-07-14 NOTE — Progress Notes (Signed)
MD, SMOLA ([id number]) [id number] Page 1 of 9 Visit Report for 07/14/2022 Arrival Information Details Patient Name: Date of Service: Chad [name], Chad [name] 07/14/2022 10:30 A M Medical Record Number: [id number] Patient Account Number: [id number] Date of Birth/Sex: Treating RN: Jun 03, 1961 (61 y.o. Chad [name], Chad [name] Primary Care Chad [name]: Chad [name] Other Clinician: Massie [name] Referring Chad [name]: Treating Chad [name]/Extender: Chad [name] in Treatment: 53 Visit Information History Since Last Visit All ordered tests and consults were completed: No Patient Arrived: Wheel Chair Added or deleted any medications: No Arrival Time: 10:35 Any new allergies or adverse reactions: No Transfer Assistance: EasyPivot Patient Lift Had a fall or experienced change in No Patient Identification Verified: Yes activities of daily living that may affect Secondary Verification Process Completed: Yes risk of falls: Patient Requires Transmission-Based No Signs or symptoms of abuse/neglect since last visito No Precautions: Hospitalized since last visit: No Patient Has Alerts: Yes Implantable device outside of the clinic excluding No Patient Alerts: Patient on Blood Thinner cellular tissue based products placed in the center NOT diabetic since last visit: aspirin 25m Has Dressing in Place as Prescribed: [facility] Now: No Electronic Signature(s) Signed: 07/15/2022 4:44:06 PM By: VMassie KluverEntered By: VMassie Kluveron 07/14/2022 10:45:32 -------------------------------------------------------------------------------- Clinic Level of Care Assessment Details Patient Name: Date of Service: Chad [name], PASSAGE12/18/2023 10:30 A M Medical Record Number: [id number] Account Number: [id number] of Birth/Sex: Treating RN: [birth year](61 y.o. MVerl BlalockPrimary Care Chad [name]: LErik ObeyOther Clinician:  VMassie KluverReferring Chad [name]: Treating Chad [name]/Extender: Chad SaugerWeeks in Treatment: 31 Clinic Level of Care Assessment Items TOOL 1 Quantity Score _0  - 0 Use when EandM and Procedure is performed on INITIAL visit ASSESSMENTS - Nursing Assessment / Reassessment _1  - 0 General Physical Exam (combine w/ comprehensive assessment (listed just below) when performed on new pt. evals) _2  - 0 Comprehensive Assessment (HX, ROS, Risk Assessments, Wounds Hx, etc.) [name], [name]([id number] Page 2 of 9 ASSESSMENTS - Wound and Skin Assessment / Reassessment _3  - 0 Dermatologic / Skin Assessment (not related to wound area) ASSESSMENTS - Ostomy and/or Continence Assessment and Care _4  - 0 Incontinence Assessment and Management _5  - 0 Ostomy Care Assessment and Management (repouching, etc.) PROCESS - Coordination of Care _6  - 0 Simple Patient / Family Education for ongoing care _7  - 0 Complex (extensive) Patient / Family Education for ongoing care _8  - 0 Staff obtains CProgrammer, systems Records, T Results / Process Orders est _9  - 0 Staff telephones HHA, Nursing Homes / Clarify orders / etc _10  - 0 Routine Transfer to another Facility (non-emergent condition) _11  - 0 Routine Hospital Admission (non-emergent condition) _12  - 0 New Admissions / IBiomedical engineer/ Ordering NPWT Apligraf, etc. , _13  - 0 Emergency Hospital Admission (emergent condition) PROCESS - Special Needs _14  - 0 Pediatric / Minor Patient Management _15  - 0 Isolation Patient Management _16  - 0 Hearing / Language / Visual special needs _17  - 0 Assessment of Community assistance (transportation, D/C planning, etc.) _18  - 0 Additional assistance / Altered mentation _19  - 0 Support Surface(s) Assessment (bed, cushion, seat, etc.) INTERVENTIONS - Miscellaneous _20  - 0 External ear exam _21  - 0 Patient Transfer (multiple staff / HCivil Service fast streamer/ Similar devices) _22   - 0 Simple Staple / Suture removal (25 or less) _23  - 0 Complex Staple / Suture removal (26 or more) _24  - 0 Hypo/Hyperglycemic Management (do not check if billed separately) _25  - 0 Ankle /  Brachial Index (ABI) - do not check if billed separately Has the patient been seen at the hospital within the last three years: Yes Total Score: 0 Level Of Care: ____ Electronic Signature(s) Signed: 07/15/2022 4:44:06 PM By: Chad [name] Entered By: Chad [name] on 07/14/2022 11:11:22 -------------------------------------------------------------------------------- Compression Therapy Details Patient Name: Date of Service: Chad [name], Chad [name] 07/14/2022 10:30 A M Medical Record Number: [id number] Patient Account Number: [id number] Date of Birth/Sex: Treating RN: 17-Oct-1960 (61 y.o. Chad [name] Primary Care [name]: Chad [name] Other Clinician: Massie [name] Referring Chad [name]: Treating [name]/Extender: Chad [name] [place], Chad [name] ([id number]) [id number] Page 3 of 9 Weeks in Treatment: 31 Compression Therapy Performed for Wound Assessment: Wound #12 Left,Distal,Medial Ankle Performed By: Lenice Pressman, Angie, Compression Type: Three Layer Pre Treatment ABI: 1 Post Procedure Diagnosis Same as Pre-procedure Electronic Signature(s) Signed: 07/15/2022 4:44:06 PM By: Chad [name] Entered By: Chad [name] on 07/14/2022 11:11:05 -------------------------------------------------------------------------------- Encounter Discharge Information Details Patient Name: Date of Service: Chad [name] 07/14/2022 10:30 A M Medical Record Number: [id number] Patient Account Number: [id number] Date of Birth/Sex: Treating RN: 1961-06-28 (61 y.o. Chad [name], Chad [name] Primary Care Tina Gruner: Chad [name] Other Clinician: Massie [name] Referring Savvas Roper: Treating Chace Klippel/Extender: Chad [name] in Treatment: 31 Encounter Discharge  Information Items Discharge Condition: Stable Ambulatory Status: Wheelchair Discharge Destination: [place] Telephoned: Yes Orders Sent: Yes Transportation: Other Accompanied By: self Schedule Follow-up Appointment: Yes Clinical Summary of Care: Electronic Signature(s) Signed: 07/15/2022 4:44:06 PM By: Chad [name] Entered By: Chad [name] on 07/14/2022 11:36:23 -------------------------------------------------------------------------------- Lower Extremity Assessment Details Patient Name: Date of Service: Chad [name], Chad [name] 07/14/2022 10:30 A M Medical Record Number: [id number] Patient Account Number: [id number] Date of Birth/Sex: Treating RN: 19-Jun-1961 (61 y.o. Chad [name] Primary Care Quest Tavenner: Chad [name] Other Clinician: Massie [name] Referring Garlan Drewes: Treating Navi Ewton/Extender: Chad [name] Weeks in Treatment: [address] Chad [name] Blue ([id number]) [id number] Page 4 of 9 Edema Assessment Assessed: [Left: Yes] [Right: No] Edema: [Left: Ye] [Right: s] Calf Left: Right: Point of Measurement: 33 cm From Medial Instep 35.8 cm Ankle Left: Right: Point of Measurement: 12 cm From Medial Instep 25.4 cm Vascular Assessment Pulses: Dorsalis Pedis Palpable: [Left:Yes] Electronic Signature(s) Signed: 07/14/2022 5:48:39 PM By: Gretta Cool, BSN, RN, CWS, Kim RN, BSN Signed: 07/15/2022 4:44:06 PM By: Chad [name] Entered By: Chad [name] on 07/14/2022 10:57:22 -------------------------------------------------------------------------------- Multi Wound Chart Details Patient Name: Date of Service: Chad [name] 07/14/2022 10:30 A M Medical Record Number: [id number] Patient Account Number: [id number] Date of Birth/Sex: Treating RN: 01/08/1961 (61 y.o. Chad [name], Chad [name] Primary Care Cedrick Partain: Chad [name] Other Clinician: Massie [name] Referring Domanique Luckett: Treating Lillianne Eick/Extender: Chad [name] Weeks in  Treatment: 31 Vital Signs Height(in): 69 Pulse(bpm): 45 Weight(lbs): 150 Blood Pressure(mmHg): 128/79 Body Mass Index(BMI): 22.1 Temperature(F): 97.9 Respiratory Rate(breaths/min): 16 [12:Photos:] [N/A:N/A] Left, Distal, Medial Ankle N/A N/A Wound Location: Gradually Appeared N/A N/A Wounding Event: Venous Leg Ulcer N/A N/A Primary Etiology: Anemia, Chronic Obstructive N/A N/A Comorbid History: Pulmonary Disease (COPD), Coronary Artery Disease, Peripheral Arterial Disease, Peripheral Venous Disease, Hepatitis C, Osteoarthritis, Neuropathy, Received Chemotherapy Chad [name] ([id number]) [id number] Page 5 of 9 07/12/2019 N/A N/A Date Acquired: 37 N/A N/A Weeks of Treatment: Open N/A N/A Wound Status: No N/A N/A Wound Recurrence: 0.1x0.1x0.1 N/A N/A Measurements L x W x D (cm) 0.008 N/A N/A A (cm) : rea 0.001 N/A N/A Volume (cm) : 99.90% N/A N/A % Reduction in Area: 99.90% N/A N/A % Reduction in Volume: Full Thickness Without Exposed N/A N/A Classification:  Support Structures None Present N/A N/A Exudate Amount: Distinct, outline attached N/A N/A Wound Margin: None Present (0%) N/A N/A Granulation Amount: None Present (0%) N/A N/A Necrotic Amount: Fat Layer (Subcutaneous Tissue): Yes N/A N/A Exposed Structures: Fascia: No Tendon: No Muscle: No Joint: No Bone: No Large (67-100%) N/A N/A Epithelialization: Treatment Notes Electronic Signature(s) Signed: 07/15/2022 4:44:06 PM By: Chad [name] Entered By: Chad [name] on 07/14/2022 10:57:26 -------------------------------------------------------------------------------- Multi-Disciplinary Care Plan Details Patient Name: Date of Service: Chad [name] 07/14/2022 10:30 A M Medical Record Number: [id number] Patient Account Number: [id number] Date of Birth/Sex: Treating RN: Aug 18, 1960 (61 y.o. Chad [name] Primary Care Alba Perillo: Chad [name] Other Clinician:  Massie [name] Referring Abdulrahim Siddiqi: Treating Sherolyn Trettin/Extender: Chad [name] Weeks in Treatment: 31 Active Inactive Venous Leg Ulcer Nursing Diagnoses: Knowledge deficit related to disease process and management Goals: Patient will maintain optimal edema control Date Initiated: 12/06/2021 Date Inactivated: 12/27/2021 Target Resolution Date: 01/03/2022 Goal Status: Met Patient/caregiver will verbalize understanding of disease process and disease management Date Initiated: 12/06/2021 Target Resolution Date: 05/29/2022 Goal Status: Active Interventions: Assess peripheral edema status every visit. Compression as ordered Notes: Electronic Signature(s) Signed: 07/14/2022 5:48:39 PM By: Gretta Cool, BSN, RN, CWS, Kim RN, BSN [place], [place] ([id number]) [id number] Page 6 of 9 Signed: 07/15/2022 4:44:06 PM By: Chad [name] Entered By: Chad [name] on 07/14/2022 11:35:36 -------------------------------------------------------------------------------- Pain Assessment Details Patient Name: Date of Service: Chad [name], Chad [name] 07/14/2022 10:30 A M Medical Record Number: [id number] Patient Account Number: [id number] Date of Birth/Sex: Treating RN: 1961/05/11 (61 y.o. Chad [name] Primary Care Angelly Spearing: Chad [name] Other Clinician: Massie [name] Referring Shaquella Stamant: Treating Bassem Bernasconi/Extender: Chad [name] Weeks in Treatment: 31 Active Problems Location of Pain Severity and Description of Pain Patient Has Paino No Site Locations Pain Management and Medication Current Pain Management: Electronic Signature(s) Signed: 07/14/2022 5:48:39 PM By: Gretta Cool, BSN, RN, CWS, Kim RN, BSN Signed: 07/15/2022 4:44:06 PM By: Chad [name] Entered By: Chad [name] on 07/14/2022 10:47:43 -------------------------------------------------------------------------------- Patient/Caregiver Education Details Patient Name: Date of Service: Chad [name]  12/18/2023andnbsp10:30 A M Medical Record Number: [id number] Patient Account Number: [id number] Chad [name] ([id number]) [id number] Page 7 of 9 Date of Birth/Gender: Treating RN: 25-Sep-1960 (61 y.o. Chad [name] Primary Care Physician: Chad [name] Other Clinician: Massie [name] Referring Physician: Treating Physician/Extender: Chad [name] in Treatment: 31 Education Assessment Education Provided To: Patient Education Topics Provided Venous: Handouts: Controlling Swelling with Multilayered Compression Wraps Methods: Explain/Verbal Responses: State content correctly Wound/Skin Impairment: Electronic Signature(s) Signed: 07/15/2022 4:44:06 PM By: Chad [name] Entered By: Chad [name] on 07/14/2022 11:35:30 -------------------------------------------------------------------------------- Wound Assessment Details Patient Name: Date of Service: Chad [name], Chad [name] 07/14/2022 10:30 A M Medical Record Number: [id number] Patient Account Number: [id number] Date of Birth/Sex: Treating RN: 1960/10/05 (61 y.o. Chad [name], Chad [name] Primary Care Cambree Hendrix: Chad [name] Other Clinician: Massie [name] Referring Harbor Vanover: Treating Deacon Gadbois/Extender: Chad [name] Weeks in Treatment: 31 Wound Status Wound Number: 12 Primary Venous Leg Ulcer Etiology: Wound Location: Left, Distal, Medial Ankle Wound Open Wounding Event: Gradually Appeared Status: Date Acquired: 07/12/2019 Comorbid Anemia, Chronic Obstructive Pulmonary Disease (COPD), Coronary Weeks Of Treatment: 31 History: Artery Disease, Peripheral Arterial Disease, Peripheral Venous Clustered Wound: No Disease, Hepatitis C, Osteoarthritis, Neuropathy, Received Chemotherapy Photos Wound Measurements Length: (cm) 0.1 Width: (cm) 0.1 Depth: (cm) 0.1 [place], Chad [name] ([id number]) Area: (cm) 0.008 Volume: (cm) 0.001 % Reduction in Area: 99.9% % Reduction in Volume:  99.9% Epithelialization: Large (67-100%) [id number] Page 8 of 9 Wound Description Classification: Full Thickness  Without Exposed Support Structures Wound Margin: Distinct, outline attached Exudate Amount: None Present Foul Odor After Cleansing: No Slough/Fibrino No Wound Bed Granulation Amount: None Present (0%) Exposed Structure Necrotic Amount: None Present (0%) Fascia Exposed: No Fat Layer (Subcutaneous Tissue) Exposed: Yes Tendon Exposed: No Muscle Exposed: No Joint Exposed: No Bone Exposed: No Treatment Notes Wound #12 (Ankle) Wound Laterality: Left, Medial, Distal Cleanser Soap and Water Discharge Instruction: Gently cleanse wound with antibacterial soap, rinse and pat dry prior to dressing wounds Wound Cleanser Discharge Instruction: Wash your hands with soap and water. Remove old dressing, discard into plastic bag and place into trash. Cleanse the wound with Wound Cleanser prior to applying a clean dressing using gauze sponges, not tissues or cotton balls. Do not scrub or use excessive force. Pat dry using gauze sponges, not tissue or cotton balls. Peri-Wound Care AandD Ointment Discharge Instruction: Apply AandD Ointment to areas of redness Topical Primary Dressing Zetuvit Plus 4x8 (in/in) Secondary Dressing Secured With Compression Wrap 3-LAYER WRAP - Profore Lite LF 3 Multilayer Compression Bandaging System Discharge Instruction: Apply 3 multi-layer wrap as prescribed. Compression Stockings Add-Ons Electronic Signature(s) Signed: 07/14/2022 5:48:39 PM By: Gretta Cool, BSN, RN, CWS, Kim RN, BSN Signed: 07/15/2022 4:44:06 PM By: Chad [name] Entered By: Chad [name] on 07/14/2022 10:54:28 -------------------------------------------------------------------------------- Vitals Details Patient Name: Date of Service: Chad [name] 07/14/2022 10:30 A M Medical Record Number: [id number] Patient Account Number: [id number] Date of  Birth/Sex: Treating RN: [birth year] (61 y.o. Chad [name] Primary Care Deshonna Trnka: Chad [name] Other Clinician: Massie [name] Referring Raphael Fitzpatrick: Treating Rechy Bost/Extender: Chad [name] Weeks in Treatment: [address] [place] ([id number]) [id number] Page 9 of 9 Vital Signs Time Taken: 10:45 Temperature (F): 97.9 Height (in): 69 Pulse (bpm): 83 Weight (lbs): 150 Respiratory Rate (breaths/min): 16 Body Mass Index (BMI): 22.1 Blood Pressure (mmHg): 128/79 Reference Range: 80 - 120 mg / dl Electronic Signature(s) Signed: 07/15/2022 4:44:06 PM By: Chad [name] Entered By: Chad [name] on 07/14/2022 10:47:37

## 2022-07-14 NOTE — Progress Notes (Addendum)
DORR, PERROT ([id number]) [id number] Page 1 of 11 Visit Report for 07/14/2022 Chief Complaint Document Details Patient Name: Date of Service: Chad [name], Chad [name] 07/14/2022 10:30 A M Medical Record Number: [id number] Patient Account Number: [id number] Date of Birth/Sex: Treating RN: Jul 20, 1961 (61 y.o. [name] Primary Care Provider: [name] Other Clinician: [name] Referring Provider: Treating Provider/Extender: [name] in Treatment: 31 Information Obtained from: Patient Chief Complaint Left ankle ulcers Electronic Signature(s) Signed: 07/14/2022 10:39:08 AM By: Worthy Keeler PA-C Entered By: Worthy Keeler on 07/14/2022 10:39:08 -------------------------------------------------------------------------------- HPI Details Patient Name: Date of Service: Chad [name] 07/14/2022 10:30 A M Medical Record Number: [id number] Patient Account Number: [id number] Date of Birth/Sex: Treating RN: 04/14/1961 (61 y.o. [name] Primary Care Provider: [name] Other Clinician: [name] Referring Provider: Treating Provider/Extender: [name] in Treatment: 31 History of Present Illness HPI Description: 10/08/18 on evaluation today patient actually presents to our office for initial evaluation concerning wounds that he has of the bilateral lower extremities. 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 He also has bilateral venous stasis. He apparently has been homeless currently following discharge from the hospital apparently he has been placed at [facility] which is is a skilled nursing facility locally. Nonetheless fortunately he does not show any signs of infection at this time which is good news. In fact several of the  wound actually appears to be showing some signs of improvement already in my pinion. There are a couple areas in the left leg in particular there likely gonna require some sharp debridement to help clear away some necrotic tissue and help with more sufficient healing. No fevers, chills, nausea, or vomiting noted at this time. 10/15/18 on evaluation today patient actually appears to be doing very well in regard to his bilateral lower extremities. He's been tolerating the dressing changes without complication. Fortunately there does not appear to be any evidence of active infection at this time which is great news. Overall I'm actually very pleased with how this has progressed in just one visits time. Readmission: 08/14/2020 upon evaluation today patient presents for re-evaluation here in our clinic. He is having issues with his left ankle region as well as his right toe and his right heel. He tells me that the toe and heel actually began as a area that was itching that he was scratching and then subsequently opened up into wounds. These may have been abscess areas I presume based on what I am seeing currently. With regard to his left ankle region he tells me this was a similar type Chad [name], Chad [name] ([id number]) [id number] Page 2 of 11 occurrence although he does have venous stasis this very well may be more of a venous leg ulcer more than anything. Nonetheless I do believe that the patient would benefit from appropriate and aggressive wound care to try to help get things under better control here. He does have history of a stroke on the left side affecting him to some degree there that he is able to stand although he does have some residual weakness. Otherwise again the patient does have chronic venous insufficiency as previously noted. His arterial studies most recently obtained showed that he had an ABI on the right of 1.16 with a TBI of 0.52 and on the left and ABI of 1.14  with a TBI of 0.81. That was obtained on 06/19/2020. 08/28/2020 upon evaluation today patient appears to be doing decently well in regard to his wounds in general. He has been tolerating the dressing changes without complication. Fortunately there does not appear to be any signs of active infection which is great news. With that being said I think the [facility] is doing a good job I would recommend that we likely continue with that currently. 09/11/2020 upon evaluation today patient's wounds did not appear to be doing too poorly but again he is not really showing signs of significant improvement with regard to any of the wounds on the right. None of them have Hydrofera Blue on them I am not exactly sure why this is not being followed as the facility did not contact us to let us know of any issues with obtaining dressings or otherwise. With that being said he is supposed to be using Hydrofera Blue on both of the wounds on the right foot as well as the ankle wound on the left side. 09/18/2020 upon evaluation today patient appears to be doing poorly with regard to his wounds. Again right now the left ankle in particular showed signs of extreme maceration. Apparently he was told by someone with staff at [facility] they could not get the [facility] With that being said this is something that is never been relayed to [place] one way or another. Also the patient subsequently has not supposed to have a border gauze dressing on. He should have an ABD pad and roll gauze to secure as this drains much too much just to have a border gauze dressing to cover. Nonetheless the fact that they are not using the appropriate dressing is directly causing deterioration of the left ankle wound it is significantly worse today compared to what it was previous. I did attempt to call [facility] while the patient was here I called three times and got no one to even pick up the phone. After this I had my for an  office coordinator call and she was able to finally get through and leave a message with the D ON as of dictation of this note which is roughly about an hour and a half later I still have not been able to speak with anyone at the facility. 09/25/2020 upon evaluation today patient actually showing signs of good improvement which is excellent news. He has been tolerating the dressing changes without complication. Fortunately there is no signs of active infection which is great news. No fevers, chills, nausea, vomiting, or diarrhea. I do feel like the facility has been doing a much better job at taking care of him as far as the dressings are concerned. However the director of nursing never did call me back. 10/09/2020 upon evaluation today patient appears to be doing well with regard to his wound. The toe ulcer did require some debridement but the other 2 areas actually appear to be doing quite well. 10/19/2020 upon evaluation today patient actually appears to be doing very well in regard to his wounds. In fact the heel does appear to be completely healed. The toe is doing better in the medial ankle on the left is also doing better. Overall I think he is headed in the right direction. 10/26/2020 upon evaluation today patient appears to be doing well with regard to his wound. He is showing signs of improvement which is great news and overall I am very pleased with where things stand today. No  fevers, chills, nausea, vomiting, or diarrhea. 11/02/2020 upon evaluation today patient appears to be doing well with regard to his wounds. He has been tolerating the dressing changes without complication overall I am extremely pleased with where things stand today. He in regard to the toe is almost completely healed and the medial ankle on the left is doing much better. 11/09/2020 upon evaluation today patient appears to be doing a little poorly in regard to his left medial ankle ulcer. Fortunately there does not appear to  be any signs of systemic infection but unfortunately locally he does appear to be infected in fact he has blue-green drainage consistent with Pseudomonas. 11/16/2020 upon evaluation today patient appears to be doing well with regard to his wound. It actually appears to be doing better. I did place him on gentamicin cream since the Cipro was actually resistant even though he was positive for Pseudomonas on culture. Overall I think that he does seem to be doing better though I am unsure whether or not they have actually been putting the cream on. The patient is not sure that we did talk to the nurse directly and she was going to initiate that treatment. Fortunately there does not appear to be any signs of active infection at this time. No fevers, chills, nausea, vomiting, or diarrhea. 4/28; the area on the right second toe is close to healed. Left medial ankle required debridement 12/07/2020 upon evaluation today patient appears to be doing well with regard to his wounds. In fact the right second toe appears to be completely healed which is great news. Fortunately there does not appear to be any signs of active infection at this time which is also great news. I think we can probably discontinue the gentamicin on top of everything else. 12/14/2020 upon evaluation today patient appears to be doing well with regard to his wound. He is making good progress and overall very pleased with where things stand today. There is no signs of active infection at this time which is great news. 12/28/2020 upon evaluation today patient appears to be doing well with regard to his wounds. He has been tolerating the dressing changes without complication. Fortunately there is no signs of active infection at this time. No fevers, chills, nausea, vomiting, or diarrhea. 12/28/2020 upon evaluation today patient's wound bed actually showed signs of excellent improvement. He has great epithelization and granulation I do not see any  signs of infection overall I am extremely pleased with where things stand at this point. No fevers, chills, nausea, vomiting, or diarrhea. 01/11/2021 upon evaluation today patient appears to be doing well with regard to his wound on his leg. He has been tolerating the dressing changes without complication. Fortunately there does not appear to be any signs of active infection which is great news. No fevers, chills, nausea, vomiting, or diarrhea. 01/25/2021 upon evaluation today patient appears to be doing well with regard to his wound. He has been tolerating the dressing changes without complication. Fortunately the collagen seems to be doing a great job which is excellent news. No fevers, chills, nausea, vomiting, or diarrhea. 02/08/2021 upon evaluation today patient's wound is actually looking a little bit worse especially in the periwound compared to previous. Fortunately there does not appear to be any signs of infection which is great news with that being said he does have some irritation around the periphery of the wound which has me more concerned. He actually had a dressing on that had not been changed in 3  days. He also is supposed to have daily dressing changes. With regard to the dressing applied he had a silver alginate dressing and silver collagen is what is recommended and ordered. He also had no Desitin around the edges of the wound in the periwound region although that is on the order inspect to be done as well. In general I was very concerned I did contact [facility] actually spoke with Magda Paganini who is the scheduling individual and subsequently she stated that she would pass the information to the D ON apparently the D ON was not available to talk to me when I call today. 02/18/2021 upon evaluation today patient's wound is actually showing signs of improvement. Fortunately there does not appear to be any evidence of infection which is great news overall I am extremely pleased with  where things stand today. No fevers, chills, nausea, vomiting, or diarrhea. 8/3; patient presents for 1 week follow-up. He has no issues or complaints today. He denies signs of infection. 03/11/2021 upon evaluation today patient appears to be doing well with regard to his wound. He does have a little bit of slough noted on the surface of the wound but fortunately there does not appear to be any signs of active infection at this time. No fevers, chills, nausea, vomiting, or diarrhea. 03/18/2021 upon evaluation today patient appears to be doing well with regard to his wound. He has been tolerating the dressing changes without complication. There was a little irritation more proximal to where the wound was that was not noted last week but nonetheless this is very superficial just seems to be more irritation we just need to make sure to put a good amount of the zinc over the area in my opinion. Otherwise he does not seem to be doing significantly worse at all which is great news. 03/25/2021 upon evaluation today patient appears to be doing well with regard to his wound. He is going require some sharp debridement today to clear with Chad [name], Chad [name] ([id number]) [id number] Page 3 of 11 some of the necrotic debris. I did perform this today without complication postdebridement wound bed appears to be doing much better this is great news. 04/08/2021 upon evaluation today patient appears to be doing decently well in regard to his wound although the overall measurement is not significantly smaller compared to previous. It is gone down a little bit but still the facility continues to not really put the appropriate dressings in place in fact he was supposed to have collagen we think he probably had more of an allergy to At this point. Fortunately there does not appear to be any signs of active infection systemically though locally I do not see anything on initial visualization either as  far as erythema or warmth. 04/15/2021 upon evaluation today patient appears to be doing well with regard to his wound. He is actually showing signs of improvement. I did place him on antibiotics last week, Cipro. He has been taking that 2 times a day and seems to be tolerating it very well. I do not see any evidence of worsening and in fact the overall appearance of the wound is smaller today which is also great news. 9/26; left medial ankle chronic venous insufficiency wound is improved. Using Hydrofera Blue 10/10; left medial ankle chronic venous insufficiency. Wound has not changed much in appearance completely nonviable surface. Apparently there have been problems getting the right product on the wound at the facility although he came in with [facility] on today  05/14/2021 upon evaluation today patient appears to be doing well with regard to his wound. I think he is making progress here which is good news. Fortunately there does not appear to be any signs of active infection at this time. No fevers, chills, nausea, vomiting, or diarrhea. 05/20/2021 upon evaluation today patient appears to be doing well with regard to his wound. He is showing signs of good improvement which is great news. There does not appear to be any evidence of active infection which is also excellent news. No fevers, chills, nausea, vomiting, or diarrhea. 05/28/2021 upon evaluation today patient appears to be doing quite well. There does not appear to be any signs of active infection at this time which is great news. Overall I am extremely pleased with where things stand today. I think he is headed in the right direction. 06/11/2021 upon evaluation today patient appears to be doing well with regard to his left ankle ulcer and poorly in regard to the toe ulcer on the second toe right foot. This appears to show signs of joint exposure. Apparently this has been present for 1 to 2 months although he kept forgetting to tell me  about it. That is unfortunate as right now it definitely appears to be doing significantly worse than what I would like to see. There does not appear to be any signs of active infection systemically though locally I am concerned about the possibility of infection the toe is quite red. Again no one from the facility ever contacted [place] to advise that this was going on in the interim either. 06/17/2021 upon evaluation today patient presents for follow-up I did review his x-ray which showed a navicular bone fracture I am unsure of the chronicity of this. Subsequently he also had osteomyelitis of the toe which was what I was more concerned about this did not show up on x-ray but did show up on the pathology scrapings. This was listed as acute osteomyelitis. Nonetheless at this point I think that the antibiotic treatment is the best regimen to go with currently. The patient is in agreement with that plan. Nonetheless he has initially 30 days of doxycycline off likely extend that towards the end of the treatment cycle that will be around the middle of December for an additional 2 weeks. That all depends on how well he continues to heal. Nonetheless based on what I am seeing in the foot I did want a proceed with an MRI as well which I think will be helpful to identify if there is anything else that needs to be addressed from the standpoint of infection. 06/24/2021 upon evaluation today patient appears to be doing pretty well in regards to his wounds. I think both are actually showing signs of improvement which is good I did review his MRI today which did show signs of osteomyelitis of the middle and proximal phalanx on his right foot of the affected toe. With that being said this is actually showing signs of significant improvement today already with the antibiotic therapy I think the redness is also improved. Overall I think that we just need to give this some time with appropriate wound care we will see how  things go potentially hyperbarics could be considered. 07/02/2021 upon inspection today patient actually appears to be doing well in regard to his left ankle which is getting very close to complete resolution of pleased in that regard. Unfortunately he is continuing to have issues with his second toe right foot and this seems  to still be very painful for him. Recommend he try something different from the standpoint of antibiotics. 07/15/2021 upon evaluation today patient appears to be doing actually pretty well in regard to his foot. This is actually showing signs of significant improvement which is great news. Overall I feel like the patient is improving both in regard to the second toe as well as the ankle on the left. With that being said the biggest issue that I do see currently is that he is needing to have a refill of the doxycycline that we previously treated him with. He also did see podiatry they are not going to recommend any amputation at this point since he seems to be doing quite well. For that reason we just need to keep things under control from an infection standpoint. 08/01/2021 upon evaluation today patient appears to be doing well with regard to his wound. He has been tolerating the dressing changes without complication. Fortunately there does not appear to be any evidence of active infection locally nor systemically at this point. In fact I think everything is doing excellent in fact his second toe on the right foot is almost healed and the ankle on the left ankle region is actually very close to being healed as well. 08/08/2021 upon evaluation today patient appears to be doing well with regard to his wound. He has been tolerating the dressing changes without complication. Fortunately I do not see any signs of active infection at this time. Readmission: 12-06-2021 upon evaluation today patient presents for reevaluation here in the clinic he does tell me that he was being seen in  facility at [facility] by a provider that was coming in. He is not sure who this was. He tells me however that the wound seems to have gotten worse even compared to where it was when we last saw him at this point. With that being said I do believe that he is likely going need ongoing wound care here in the clinic and I do believe that we need to be the ones to frontline this since his wound does seem to be getting worse not better at this point. He voiced understanding. He is also in agreement with this plan and feels more comfortable coming here she tells me. Patient's medical history really has not changed since his prior admission he was only gone since January. 12-27-2021 upon evaluation today patient appears to be doing well with regard to his wound they did run out of the [facility] so they did not put anything on just an ABD pad with gentamicin. Still we are seeing some signs of good improvement here with some new epithelization which is great news. 01-10-2022 upon evaluation today patient appears to be doing well with regard to his wounds and he is going require some sharp debridement but overall seems to be making good progress. Fortunately I do not see any evidence of active infection locally or systemically at this time which is great news. 01-24-2022 upon evaluation today patient appears to be doing well with regard to his wound. The facility actually came and dropped him off early and he had another appointment at the hospital and then they just brought him over here and this was still hours before his appointment this afternoon. For that reason we did do our best to work him in this morning and fortunately had some space to make this happen. With that being said patient's wound does seem to be making progress here and I  am very pleased in that regard I do not see any signs of active infection locally or systemically at this time. 02-07-2022 patient appears to be doing well  currently in regard to his wounds. In fact one of them the more proximal is healed the distal is still open but seems to be doing excellent. Fortunately I do not see any evidence of active infection locally or systemically at this time which is great news. No fevers, chills, nausea, vomiting, or diarrhea. 7/27; left medial ankle venous. Improving per our intake nurse. We are using Hydrofera Blue under Tubigrip compression. They are changing that at his facility 03-06-2022 upon evaluation today patient appears to be doing well with regard to the his wound he is can require some sharp debridement but seems to be making excellent progress. Fortunately I do not see any evidence of active infection locally or systemically which is great news. 03-27-2022 upon evaluation today patient's wound is actually showing signs of excellent improvement. Fortunately I see no signs of active infection locally or systemically at this time which is great news. No fevers, chills, nausea, vomiting, or diarrhea. Chad [name], Chad [name] ([id number]) [id number] Page 4 of 11 04-21-2022 upon evaluation today patient appears to be doing excellent in regard to his wound in fact this is very close to resolution based on what I am seeing. I do not see any evidence of active infection locally or systemically at this time which is great news and overall I am extremely pleased with where we are today. 05-05-2022 upon evaluation today patient's wound actually appears to potentially be completely healed. Fortunately I do not see any evidence of active infection at this time which is great news and overall I am very pleased I think this needs a little time to toughen up but other than that I really do believe were doing quite well. He is very pleased to hear this its been a long time coming. 05-19-2022 upon evaluation today patient appears to be doing well currently in regard to his wound. He in fact we were hoping will  be completely healed and closed but it still has a very tiny area right in the center which is still continuing to drain. Fortunately I do not see any signs of infection locally or systemically which is great news. 11/6; this wound is close to completely" he comes in this week with some erythema at 1 edge of this which looks like something was rubbing on the area. Other than that no evidence of infection 06-16-2022 upon evaluation today patient appears to be doing well currently in regard to his ankle ulcer. This is very close to being healed but still has a small area in the central portion which is not. Fortunately there does not appear to be any signs of infection locally or systemically which is great news. No fevers, chills, nausea, vomiting, or diarrhea. 06-30-2022 upon evaluation patient's wound pretty much appears to be almost completely closed. In fact it may even be closed but there are still a lot of skin irritation around the edges of the wound and I just do not feel great about releasing them yet I would like to continue to monitor this just a little bit longer before getting to that point. He is not opposed to this and in fact is happy to continue to come in if need be in order to make sure that things are moving in the right direction he definitely does not want to backtrack. 07-14-2022 upon evaluation today patient  appears to be doing well currently in regard to his wound. Has been tolerating the dressing changes without complication. Fortunately I see no evidence of active infection locally nor systemically which is great news. No fevers, chills, nausea, vomiting, or diarrhea. Electronic Signature(s) Signed: 07/14/2022 11:09:32 AM By: Worthy Keeler PA-C Entered By: Worthy Keeler on 07/14/2022 11:09:32 -------------------------------------------------------------------------------- Physical Exam Details Patient Name: Date of Service: Chad [name], Chad [name] 07/14/2022 10:30 A  M Medical Record Number: [id number] Patient Account Number: [id number] Date of Birth/Sex: Treating RN: 11/26/1960 (61 y.o. [name] Primary Care Provider: [name] Other Clinician: [name] Referring Provider: Treating Provider/Extender: [name] in Treatment: 81 Constitutional Well-nourished and well-hydrated in no acute distress. Respiratory normal breathing without difficulty. Psychiatric this patient is able to make decisions and demonstrates good insight into disease process. Alert and Oriented x 3. pleasant and cooperative. Notes Upon further inspection patient's wound actually showed signs of looking to be completely healed which is great news. With that being said without a lot of weeping around the edges of the wound which I think is due to the swelling of the leg I am out of try to get him in a compression wrap to get the swelling under control he may need this for a couple weeks before ready to completely discharge. Electronic Signature(s) Signed: 07/14/2022 11:09:54 AM By: Worthy Keeler PA-C Entered By: Worthy Keeler on 07/14/2022 11:09:54 Chad [name] ([id number]) [id number] Page 5 of 11 -------------------------------------------------------------------------------- Physician Orders Details Patient Name: Date of Service: Chad [name], Chad [name] 07/14/2022 10:30 A M Medical Record Number: [id number] Patient Account Number: [id number] Date of Birth/Sex: Treating RN: 17-Jan-1961 (61 y.o. 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 Bathing/ Shower/ Hygiene May shower with wound dressing protected with water repellent cover or cast protector. No tub bath. Anesthetic (Use 'Patient Medications' Section for Anesthetic Order Entry) Lidocaine applied to wound bed Edema Control - Lymphedema / Segmental Compressive Device / Other 3 Layer Compression System for Lymphedema. - left lower leg once weekly Elevate legs to the level of the heart and pump ankles as often as possible Elevate leg(s) parallel to the floor when sitting. Medications-Please add to medication list. Other: - apply AandD ointment to areas of redness Wound Treatment Wound #12 - Ankle Wound Laterality: Left, Medial, Distal Cleanser: Soap and Water 1 x Per Week/30 Days Discharge Instructions: Gently cleanse wound with antibacterial soap, rinse and pat dry prior to dressing wounds Cleanser: Wound Cleanser 1 x Per Week/30 Days Discharge Instructions: Wash your hands with soap and water. Remove old dressing, discard into plastic bag and place into trash. Cleanse the wound with Wound Cleanser prior to applying a clean dressing using gauze sponges, not tissues or cotton balls. Do not scrub or use excessive force. Pat dry using gauze sponges, not tissue or cotton balls. Peri-Wound Care: AandD Ointment 1 x Per Week/30 Days Discharge Instructions: Apply AandD Ointment to areas of redness Prim Dressing: Zetuvit Plus 4x8 (in/in) ary 1 x Per Week/30 Days Compression Wrap: 3-LAYER WRAP - Profore Lite LF 3 Multilayer Compression  Bandaging System 1 x Per Week/30 Days Discharge Instructions: Apply 3 multi-layer wrap as prescribed. Electronic Signature(s) Unsigned Entered By: [name] on 07/14/2022 11:11:16 Signature(s): Chad [name] ([id number]) [phone] Date(s): [id number] Page 6 of  11 -------------------------------------------------------------------------------- Problem List Details Patient Name: Date of Service: Chad [name], Chad [name] 07/14/2022 10:30 A M Medical Record Number: [id number] Patient Account Number: [id number] Date of Birth/Sex: Treating RN: 03/21/1961 (61 y.o. 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 [name] Primary Care Provider: [name] Other Clinician: [name] Referring Provider: Treating Provider/Extender: [name] in Treatment: [address] [place] ([id number]) [id number] Page 7 of 11 Chief Complaint Information obtained from Patient Left ankle ulcers History of Present Illness (HPI) 10/08/18 on evaluation today patient actually presents to our office for initial evaluation concerning wounds that he has of the bilateral lower extremities. 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 He also has bilateral venous stasis. He apparently has been homeless currently following discharge from the hospital apparently he has been placed at [facility] which is is a skilled nursing facility locally. Nonetheless fortunately he does not show any signs of infection at this time which is good news. In fact several of the wound actually appears to be showing some signs of improvement already in my pinion. There are a couple areas in the left leg in particular there likely gonna require some sharp debridement to help clear away some necrotic tissue and help with more sufficient healing. No fevers, chills, nausea, or vomiting noted at this time. 10/15/18 on evaluation today patient actually appears to be doing very well in regard to his bilateral lower extremities. He's been tolerating the dressing changes without complication. Fortunately there does not appear to be any evidence of active infection at this time which is great news. Overall I'm actually very pleased with how this has progressed in just one visits time. Readmission: 08/14/2020 upon evaluation today patient presents for re-evaluation here in our clinic. He is having issues with his left ankle region as well as his right toe and his right heel. He tells me that the toe and heel actually began as a area that was itching that he was scratching and then subsequently opened up into wounds. These may have been abscess areas I presume based on what I am seeing currently. With regard to his left ankle region he tells me this was a similar type occurrence although he does have venous stasis this very well may be more of a venous leg ulcer more than anything. Nonetheless I do believe that the patient would benefit from appropriate and  aggressive wound care to try to help get  things under better control here. He does have history of a stroke on the left side affecting him to some degree there that he is able to stand although he does have some residual weakness. Otherwise again the patient does have chronic venous insufficiency as previously noted. His arterial studies most recently obtained showed that he had an ABI on the right of 1.16 with a TBI of 0.52 and on the left and ABI of 1.14 with a TBI of 0.81. That was obtained on 06/19/2020. 08/28/2020 upon evaluation today patient appears to be doing decently well in regard to his wounds in general. He has been tolerating the dressing changes without complication. Fortunately there does not appear to be any signs of active infection which is great news. With that being said I think the [facility] is doing a good job I would recommend that we likely continue with that currently. 09/11/2020 upon evaluation today patient's wounds did not appear to be doing too poorly but again he is not really showing signs of significant improvement with regard to any of the wounds on the right. None of them have Hydrofera Blue on them I am not exactly sure why this is not being followed as the facility did not contact us to let us know of any issues with obtaining dressings or otherwise. With that being said he is supposed to be using Hydrofera Blue on both of the wounds on the right foot as well as the ankle wound on the left side. 09/18/2020 upon evaluation today patient appears to be doing poorly with regard to his wounds. Again right now the left ankle in particular showed signs of extreme maceration. Apparently he was told by someone with staff at [facility] they could not get the [facility]. With that being said this is something that is never been relayed to [place] one way or another. Also the patient subsequently has not supposed to have a border gauze dressing on. He should have an ABD pad and roll gauze to secure as this  drains much too much just to have a border gauze dressing to cover. Nonetheless the fact that they are not using the appropriate dressing is directly causing deterioration of the left ankle wound it is significantly worse today compared to what it was previous. I did attempt to call [facility] while the patient was here I called three times and got no one to even pick up the phone. After this I had my for an office coordinator call and she was able to finally get through and leave a message with the D ON as of dictation of this note which is roughly about an hour and a half later I still have not been able to speak with anyone at the facility. 09/25/2020 upon evaluation today patient actually showing signs of good improvement which is excellent news. He has been tolerating the dressing changes without complication. Fortunately there is no signs of active infection which is great news. No fevers, chills, nausea, vomiting, or diarrhea. I do feel like the facility has been doing a much better job at taking care of him as far as the dressings are concerned. However the director of nursing never did call me back. 10/09/2020 upon evaluation today patient appears to be doing well with regard to his wound. The toe ulcer did require some debridement but the other 2 areas actually appear to be doing quite well. 10/19/2020 upon evaluation today  patient actually appears to be doing very well in regard to his wounds. In fact the heel does appear to be completely healed. The toe is doing better in the medial ankle on the left is also doing better. Overall I think he is headed in the right direction. 10/26/2020 upon evaluation today patient appears to be doing well with regard to his wound. He is showing signs of improvement which is great news and overall I am very pleased with where things stand today. No fevers, chills, nausea, vomiting, or diarrhea. 11/02/2020 upon evaluation today patient appears to be doing  well with regard to his wounds. He has been tolerating the dressing changes without complication overall I am extremely pleased with where things stand today. He in regard to the toe is almost completely healed and the medial ankle on the left is doing much better. 11/09/2020 upon evaluation today patient appears to be doing a little poorly in regard to his left medial ankle ulcer. Fortunately there does not appear to be any signs of systemic infection but unfortunately locally he does appear to be infected in fact he has blue-green drainage consistent with Pseudomonas. 11/16/2020 upon evaluation today patient appears to be doing well with regard to his wound. It actually appears to be doing better. I did place him on gentamicin cream since the Cipro was actually resistant even though he was positive for Pseudomonas on culture. Overall I think that he does seem to be doing better though I am unsure whether or not they have actually been putting the cream on. The patient is not sure that we did talk to the nurse directly and she was going to initiate that treatment. Fortunately there does not appear to be any signs of active infection at this time. No fevers, chills, nausea, vomiting, or diarrhea. 4/28; the area on the right second toe is close to healed. Left medial ankle required debridement 12/07/2020 upon evaluation today patient appears to be doing well with regard to his wounds. In fact the right second toe appears to be completely healed which is great news. Fortunately there does not appear to be any signs of active infection at this time which is also great news. I think we can probably discontinue the gentamicin on top of everything else. 12/14/2020 upon evaluation today patient appears to be doing well with regard to his wound. He is making good progress and overall very pleased with where things stand today. There is no signs of active infection at this time which is great news. 12/28/2020 upon  evaluation today patient appears to be doing well with regard to his wounds. He has been tolerating the dressing changes without complication. Fortunately there is no signs of active infection at this time. No fevers, chills, nausea, vomiting, or diarrhea. 12/28/2020 upon evaluation today patient's wound bed actually showed signs of excellent improvement. He has great epithelization and granulation I do not see any signs of infection overall I am extremely pleased with where things stand at this point. No fevers, chills, nausea, vomiting, or diarrhea. 01/11/2021 upon evaluation today patient appears to be doing well with regard to his wound on his leg. He has been tolerating the dressing changes without complication. Fortunately there does not appear to be any signs of active infection which is great news. No fevers, chills, nausea, vomiting, or diarrhea. 01/25/2021 upon evaluation today patient appears to be doing well with regard to his wound. He has been tolerating the dressing changes without complication. Fortunately the collagen  seems to be doing a great job which is excellent news. No fevers, chills, nausea, vomiting, or diarrhea. Chad [name], Chad [name] ([id number]) [id number] Page 8 of 11 02/08/2021 upon evaluation today patient's wound is actually looking a little bit worse especially in the periwound compared to previous. Fortunately there does not appear to be any signs of infection which is great news with that being said he does have some irritation around the periphery of the wound which has me more concerned. He actually had a dressing on that had not been changed in 3 days. He also is supposed to have daily dressing changes. With regard to the dressing applied he had a silver alginate dressing and silver collagen is what is recommended and ordered. He also had no Desitin around the edges of the wound in the periwound region although that is on the order inspect to be done  as well. In general I was very concerned I did contact [facility] actually spoke with Magda Paganini who is the scheduling individual and subsequently she stated that she would pass the information to the D ON apparently the D ON was not available to talk to me when I call today. 02/18/2021 upon evaluation today patient's wound is actually showing signs of improvement. Fortunately there does not appear to be any evidence of infection which is great news overall I am extremely pleased with where things stand today. No fevers, chills, nausea, vomiting, or diarrhea. 8/3; patient presents for 1 week follow-up. He has no issues or complaints today. He denies signs of infection. 03/11/2021 upon evaluation today patient appears to be doing well with regard to his wound. He does have a little bit of slough noted on the surface of the wound but fortunately there does not appear to be any signs of active infection at this time. No fevers, chills, nausea, vomiting, or diarrhea. 03/18/2021 upon evaluation today patient appears to be doing well with regard to his wound. He has been tolerating the dressing changes without complication. There was a little irritation more proximal to where the wound was that was not noted last week but nonetheless this is very superficial just seems to be more irritation we just need to make sure to put a good amount of the zinc over the area in my opinion. Otherwise he does not seem to be doing significantly worse at all which is great news. 03/25/2021 upon evaluation today patient appears to be doing well with regard to his wound. He is going require some sharp debridement today to clear with some of the necrotic debris. I did perform this today without complication postdebridement wound bed appears to be doing much better this is great news. 04/08/2021 upon evaluation today patient appears to be doing decently well in regard to his wound although the overall measurement is not  significantly smaller compared to previous. It is gone down a little bit but still the facility continues to not really put the appropriate dressings in place in fact he was supposed to have collagen we think he probably had more of an allergy to At this point. Fortunately there does not appear to be any signs of active infection systemically though locally I do not see anything on initial visualization either as far as erythema or warmth. 04/15/2021 upon evaluation today patient appears to be doing well with regard to his wound. He is actually showing signs of improvement. I did place him on antibiotics last week, Cipro. He has been taking that 2 times a day and  seems to be tolerating it very well. I do not see any evidence of worsening and in fact the overall appearance of the wound is smaller today which is also great news. 9/26; left medial ankle chronic venous insufficiency wound is improved. Using Hydrofera Blue 10/10; left medial ankle chronic venous insufficiency. Wound has not changed much in appearance completely nonviable surface. Apparently there have been problems getting the right product on the wound at the facility although he came in with [facility] on today 05/14/2021 upon evaluation today patient appears to be doing well with regard to his wound. I think he is making progress here which is good news. Fortunately there does not appear to be any signs of active infection at this time. No fevers, chills, nausea, vomiting, or diarrhea. 05/20/2021 upon evaluation today patient appears to be doing well with regard to his wound. He is showing signs of good improvement which is great news. There does not appear to be any evidence of active infection which is also excellent news. No fevers, chills, nausea, vomiting, or diarrhea. 05/28/2021 upon evaluation today patient appears to be doing quite well. There does not appear to be any signs of active infection at this time which is  great news. Overall I am extremely pleased with where things stand today. I think he is headed in the right direction. 06/11/2021 upon evaluation today patient appears to be doing well with regard to his left ankle ulcer and poorly in regard to the toe ulcer on the second toe right foot. This appears to show signs of joint exposure. Apparently this has been present for 1 to 2 months although he kept forgetting to tell me about it. That is unfortunate as right now it definitely appears to be doing significantly worse than what I would like to see. There does not appear to be any signs of active infection systemically though locally I am concerned about the possibility of infection the toe is quite red. Again no one from the facility ever contacted [place] to advise that this was going on in the interim either. 06/17/2021 upon evaluation today patient presents for follow-up I did review his x-ray which showed a navicular bone fracture I am unsure of the chronicity of this. Subsequently he also had osteomyelitis of the toe which was what I was more concerned about this did not show up on x-ray but did show up on the pathology scrapings. This was listed as acute osteomyelitis. Nonetheless at this point I think that the antibiotic treatment is the best regimen to go with currently. The patient is in agreement with that plan. Nonetheless he has initially 30 days of doxycycline off likely extend that towards the end of the treatment cycle that will be around the middle of December for an additional 2 weeks. That all depends on how well he continues to heal. Nonetheless based on what I am seeing in the foot I did want a proceed with an MRI as well which I think will be helpful to identify if there is anything else that needs to be addressed from the standpoint of infection. 06/24/2021 upon evaluation today patient appears to be doing pretty well in regards to his wounds. I think both are actually showing signs of  improvement which is good I did review his MRI today which did show signs of osteomyelitis of the middle and proximal phalanx on his right foot of the affected toe. With that being said this is actually showing signs of significant improvement  today already with the antibiotic therapy I think the redness is also improved. Overall I think that we just need to give this some time with appropriate wound care we will see how things go potentially hyperbarics could be considered. 07/02/2021 upon inspection today patient actually appears to be doing well in regard to his left ankle which is getting very close to complete resolution of pleased in that regard. Unfortunately he is continuing to have issues with his second toe right foot and this seems to still be very painful for him. Recommend he try something different from the standpoint of antibiotics. 07/15/2021 upon evaluation today patient appears to be doing actually pretty well in regard to his foot. This is actually showing signs of significant improvement which is great news. Overall I feel like the patient is improving both in regard to the second toe as well as the ankle on the left. With that being said the biggest issue that I do see currently is that he is needing to have a refill of the doxycycline that we previously treated him with. He also did see podiatry they are not going to recommend any amputation at this point since he seems to be doing quite well. For that reason we just need to keep things under control from an infection standpoint. 08/01/2021 upon evaluation today patient appears to be doing well with regard to his wound. He has been tolerating the dressing changes without complication. Fortunately there does not appear to be any evidence of active infection locally nor systemically at this point. In fact I think everything is doing excellent in fact his second toe on the right foot is almost healed and the ankle on the left ankle  region is actually very close to being healed as well. 08/08/2021 upon evaluation today patient appears to be doing well with regard to his wound. He has been tolerating the dressing changes without complication. Fortunately I do not see any signs of active infection at this time. Readmission: 12-06-2021 upon evaluation today patient presents for reevaluation here in the clinic he does tell me that he was being seen in facility at [place] by a provider that was coming in. He is not sure who this was. He tells me however that the wound seems to have gotten worse even compared to where it was when we last saw him at this point. With that being said I do believe that he is likely going need ongoing wound care here in the clinic and I do believe that we need to be the ones to frontline this since his wound does seem to be getting worse not better at this point. He voiced understanding. He is also in agreement with this plan and feels more comfortable coming here she tells me. Patient's medical history really has not changed since his prior admission he was only gone since January. Chad [name], Chad [name] ([id number]) [id number] Page 9 of 11 12-27-2021 upon evaluation today patient appears to be doing well with regard to his wound they did run out of the [facility] so they did not put anything on just an ABD pad with gentamicin. Still we are seeing some signs of good improvement here with some new epithelization which is great news. 01-10-2022 upon evaluation today patient appears to be doing well with regard to his wounds and he is going require some sharp debridement but overall seems to be making good progress. Fortunately I do not see any evidence of active infection locally or systemically at  this time which is great news. 01-24-2022 upon evaluation today patient appears to be doing well with regard to his wound. The facility actually came and dropped him off early and  he had another appointment at the hospital and then they just brought him over here and this was still hours before his appointment this afternoon. For that reason we did do our best to work him in this morning and fortunately had some space to make this happen. With that being said patient's wound does seem to be making progress here and I am very pleased in that regard I do not see any signs of active infection locally or systemically at this time. 02-07-2022 patient appears to be doing well currently in regard to his wounds. In fact one of them the more proximal is healed the distal is still open but seems to be doing excellent. Fortunately I do not see any evidence of active infection locally or systemically at this time which is great news. No fevers, chills, nausea, vomiting, or diarrhea. 7/27; left medial ankle venous. Improving per our intake nurse. We are using Hydrofera Blue under Tubigrip compression. They are changing that at his facility 03-06-2022 upon evaluation today patient appears to be doing well with regard to the his wound he is can require some sharp debridement but seems to be making excellent progress. Fortunately I do not see any evidence of active infection locally or systemically which is great news. 03-27-2022 upon evaluation today patient's wound is actually showing signs of excellent improvement. Fortunately I see no signs of active infection locally or systemically at this time which is great news. No fevers, chills, nausea, vomiting, or diarrhea. 04-21-2022 upon evaluation today patient appears to be doing excellent in regard to his wound in fact this is very close to resolution based on what I am seeing. I do not see any evidence of active infection locally or systemically at this time which is great news and overall I am extremely pleased with where we are today. 05-05-2022 upon evaluation today patient's wound actually appears to potentially be completely healed.  Fortunately I do not see any evidence of active infection at this time which is great news and overall I am very pleased I think this needs a little time to toughen up but other than that I really do believe were doing quite well. He is very pleased to hear this its been a long time coming. 05-19-2022 upon evaluation today patient appears to be doing well currently in regard to his wound. He in fact we were hoping will be completely healed and closed but it still has a very tiny area right in the center which is still continuing to drain. Fortunately I do not see any signs of infection locally or systemically which is great news. 11/6; this wound is close to completely" he comes in this week with some erythema at 1 edge of this which looks like something was rubbing on the area. Other than that no evidence of infection 06-16-2022 upon evaluation today patient appears to be doing well currently in regard to his ankle ulcer. This is very close to being healed but still has a small area in the central portion which is not. Fortunately there does not appear to be any signs of infection locally or systemically which is great news. No fevers, chills, nausea, vomiting, or diarrhea. 06-30-2022 upon evaluation patient's wound pretty much appears to be almost completely closed. In fact it may even be closed but there  are still a lot of skin irritation around the edges of the wound and I just do not feel great about releasing them yet I would like to continue to monitor this just a little bit longer before getting to that point. He is not opposed to this and in fact is happy to continue to come in if need be in order to make sure that things are moving in the right direction he definitely does not want to backtrack. 07-14-2022 upon evaluation today patient appears to be doing well currently in regard to his wound. Has been tolerating the dressing changes without complication. Fortunately I see no evidence of  active infection locally nor systemically which is great news. No fevers, chills, nausea, vomiting, or diarrhea. Objective Constitutional Well-nourished and well-hydrated in no acute distress. Vitals Time Taken: 10:45 AM, Height: 69 in, Weight: 150 lbs, BMI: 22.1, Temperature: 97.9 F, Pulse: 83 bpm, Respiratory Rate: 16 breaths/min, Blood Pressure: 128/79 mmHg. Respiratory normal breathing without difficulty. Psychiatric this patient is able to make decisions and demonstrates good insight into disease process. Alert and Oriented x 3. pleasant and cooperative. General Notes: Upon further inspection patient's wound actually showed signs of looking to be completely healed which is great news. With that being said without a lot of weeping around the edges of the wound which I think is due to the swelling of the leg I am out of try to get him in a compression wrap to get the swelling under control he may need this for a couple weeks before ready to completely discharge. Integumentary (Hair, Skin) Wound #12 status is Open. Original cause of wound was Gradually Appeared. The date acquired was: 07/12/2019. The wound has been in treatment 31 weeks. The wound is located on the Left,Distal,Medial Ankle. The wound measures 0.1cm length x 0.1cm width x 0.1cm depth; 0.008cm^2 area and 0.001cm^3 volume. There is Fat Layer (Subcutaneous Tissue) exposed. There is a none present amount of drainage noted. The wound margin is distinct with the outline attached to the wound base. There is no granulation within the wound bed. There is no necrotic tissue within the wound bed. 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 Bathing/ Shower/ Hygiene: May shower with wound dressing protected with water repellent cover or cast protector. No tub bath. Anesthetic (Use 'Patient Medications' Section for Anesthetic Order Entry): Lidocaine applied to wound bed Edema Control - Lymphedema / Segmental Compressive Device / Other: 3 Layer Compression System for Lymphedema. - left lower leg once weekly Elevate legs to the level of the heart and pump ankles as often as possible Elevate leg(s) parallel to the floor when sitting. Medications-Please add to medication list.: Other: - apply AandD ointment to areas of redness WOUND #12: - Ankle Wound Laterality: Left, Medial, Distal Cleanser: Soap and Water 1 x Per Week/30 Days Discharge Instructions: Gently cleanse wound with antibacterial soap, rinse and pat dry prior to dressing wounds Cleanser: Wound Cleanser 1 x Per Week/30 Days Discharge Instructions: Wash your hands with soap and water. Remove old dressing, discard into plastic bag and place into trash. Cleanse the wound with Wound Cleanser prior to applying a clean dressing using gauze sponges, not tissues or cotton balls. Do not scrub or use excessive  force. Pat dry using gauze sponges, not tissue or cotton balls. Peri-Wound Care: AandD Ointment 1 x Per Week/30 Days Discharge Instructions: Apply AandD Ointment to areas of redness Prim Dressing: Zetuvit Plus 4x8 (in/in) 1 x Per Week/30 Days ary Com pression Wrap: 3-LAYER WRAP - Profore Lite LF 3 Multilayer Compression Bandaging System 1 x Per Week/30 Days Discharge Instructions: Apply 3 multi-layer wrap as prescribed. 1. I am going to recommend that we have the patient continue to monitor for any signs of infection or worsening. Obviously if anything changes he should let me know otherwise we will plan to see him in 1 weeks time. 2. I am also can recommend that we have the patient  continue with the Zetuvit's although we will get a use AandD ointment followed by the Zetuvit to cover I think that is going to be the best way to go at this point. He is in agreement with that plan as well. We will see patient back for reevaluation in 1 week here in the clinic. If anything worsens or changes patient will contact our office for additional recommendations. Electronic Signature(s) Signed: 07/14/2022 11:10:53 AM By: Worthy Keeler PA-C Entered By: Worthy Keeler on 07/14/2022 11:10:53 -------------------------------------------------------------------------------- SuperBill Details Patient Name: Date of Service: Chad [name], Chad [name] 07/14/2022 Medical Record Number: [id number] Patient Account Number: [id number] Date of Birth/Sex: Treating RN: 26-Jun-1961 (61 y.o. [name] Primary Care Provider: [name] Other Clinician: [name] Referring Provider: Treating Provider/Extender: [name] in Treatment: 31 Diagnosis Coding ICD-10 Codes Code Description LEDFORD, GOODSON ([id number]) [id number] Page 11 of 11 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 (idiopathic) with ulcer and inflammation of left lower extremity L97.322 Non-pressure chronic ulcer of left ankle with fat layer exposed 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 side Facility Procedures : CPT4 Code: [id number] Description: (Facility Use Only) [phone] - [facility] LWR LT LEG ICD-10 Diagnosis Description 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 (idiopathic) with ulcer and inflammation of left lower e Modifier: xtremity Quantity: 1 Physician Procedures : CPT4 Code Description Modifier [id number] - WC PHYS LEVEL 3 - EST PT ICD-10 Diagnosis Description 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 (idiopathic) with ulcer and inflammation of left lower extremity L97.322 Non-pressure chronic ulcer of left ankle  with  fat layer exposed 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 side Quantity: 1 Electronic Signature(s) Unsigned Previous Signature: 07/14/2022 11:11:08 AM Version By: Worthy Keeler PA-C Entered By: [name] on 07/14/2022 11:11:54 Signature(s): Date(s):

## 2022-07-18 MED FILL — Iron Sucrose Inj 20 MG/ML (Fe Equiv): INTRAVENOUS | Qty: 10 | Status: AC

## 2022-07-22 ENCOUNTER — Inpatient Hospital Stay: Payer: Medicaid Other

## 2022-07-22 ENCOUNTER — Inpatient Hospital Stay: Payer: Medicaid Other | Admitting: Internal Medicine

## 2022-07-22 NOTE — Assessment & Plan Note (Deleted)
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 On keytruda-SEP, 22nd 2023-radiologically mild progressive disease noted in the proximal right ureter; however overall clinically stable. See below-regarding sigmoid cancer- will repeat scan at next visit.   # Proceed with  [place] today; Labs today reviewed; Labs today reviewed;  acceptable for treatment today. STABLE. TSH AUG 2023--WNL.   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 SEP 22nd, 2023 CT scan- Focal wall thickening in the proximal sigmoid colon; with worsening of pericolonic lymph nodes . ; s/p evaluation with Dr. Bary Castilla- over all clinically STABLE.  # Iron deficiency anemia-hemoglobin 11-12; FEB 2023- iron studies-7/ferritin-18 LOW; s/p  venofer-hemoglobin 12.3.STABLE.  # Hypocalcemia- Mild- on Ca+vit D; JUNE 2023- 25-OH vit D levels: 30. STABLE.   #DISPOSITION:  #  Keytruda today; Venofer today-   # follow up in 3 weeks-; MD- labs-cbc/cmp; Keytruda; venofer- -Dr.B.

## 2022-07-22 NOTE — Progress Notes (Deleted)
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  Is alone/in a wheelchair.  Chad [name] 61 y.o.  male above history of stage IV-ureteral cancer/history of stage III colon cancer right side; recurrent sigmoid colon cancer [un-resected/under surveillance] and multiple other comorbidities currently on Keytruda is here for follow-up.  In the interim patient has met with Merrily Pew Borders from palliative care.  Patient denies new problems/concerns today.  Denies any worsening abdominal pain.   Any nausea vomiting or constipation.  No blood in stools or black-colored stools.  No hematuria.  Chronic shortness of breath.  Not any worse.  Review of Systems  Constitutional:  Positive for malaise/fatigue. Negative for chills, diaphoresis, fever and weight loss.  HENT:  Negative for nosebleeds and sore throat.   Eyes:  Negative for double vision.  Respiratory:  Positive for cough and shortness of breath. Negative for hemoptysis and wheezing.   Cardiovascular:  Negative for chest pain, palpitations and orthopnea.  Gastrointestinal:  Positive for constipation. Negative for abdominal pain, blood in stool, diarrhea, heartburn, melena, nausea and vomiting.  Genitourinary:  Negative for dysuria, frequency and urgency.  Musculoskeletal:  Positive for back pain and joint pain.  Skin: Negative.  Negative for itching and rash.  Neurological:  Positive for focal weakness. Negative for dizziness, tingling, weakness and headaches.       Chronic left-sided weakness upper than lower extremity.  Endo/Heme/Allergies:  Does not bruise/bleed easily.  Psychiatric/Behavioral:  Negative for depression. The patient is not nervous/anxious and does not have insomnia.      MEDICAL HISTORY:  Past Medical History:  Diagnosis Date   Anemia    Anxiety    ARF (acute respiratory failure) (HCC)    Bladder cancer (HCC)    COPD (chronic obstructive pulmonary disease) ([place])    Depression    Dysphagia    Family history of colon cancer    Family history of kidney cancer    Family history of leukemia    Family history of prostate cancer    GERD (gastroesophageal reflux disease)    Hepatitis    chronic hep c   Hydronephrosis  Hydronephrosis with ureteral stricture    Hyperlipidemia    Knee pain    Left   Malignant neoplasm of colon (HCC)    Nerve pain    Peripheral vascular disease (HCC)    Prostate cancer (HCC)    Stroke (HCC)    Ureteral cancer, right ([place])    Urinary frequency    Venous hypertension  of both lower extremities     SURGICAL HISTORY: Past Surgical History:  Procedure Laterality Date   COLON SURGERY     En bloc extended right hemicolectomy 07/2017   COLONOSCOPY WITH PROPOFOL N/A 11/06/2020   Procedure: COLONOSCOPY WITH PROPOFOL;  Surgeon: Jonathon Bellows, MD;  Location: [facility];  Service: Gastroenterology;  Laterality: N/A;   COLONOSCOPY WITH PROPOFOL N/A 07/31/2021   Procedure: COLONOSCOPY WITH PROPOFOL;  Surgeon: Nylan Bellow, MD;  Location: [facility];  Service: Endoscopy;  Laterality: N/A;   CYSTOSCOPY W/ RETROGRADES Right 08/30/2018   Procedure: CYSTOSCOPY WITH RETROGRADE PYELOGRAM;  Surgeon: Hollice Espy, MD;  Location: [facility];  Service: Urology;  Laterality: Right;   CYSTOSCOPY WITH STENT PLACEMENT Right 04/25/2018   Procedure: CYSTOSCOPY WITH STENT PLACEMENT;  Surgeon: Hollice Espy, MD;  Location: [facility];  Service: Urology;  Laterality: Right;   CYSTOSCOPY WITH STENT PLACEMENT Right 08/30/2018   Procedure: [place];  Surgeon: Hollice Espy, MD;  Location: [facility];  Service: Urology;  Laterality: Right;   CYSTOSCOPY WITH STENT PLACEMENT Right 03/07/2019   Procedure: CYSTOSCOPY WITH STENT Exchange;  Surgeon: Hollice Espy, MD;  Location: [facility];  Service: Urology;  Laterality: Right;   CYSTOSCOPY WITH STENT PLACEMENT Right 11/21/2019   Procedure: CYSTOSCOPY WITH STENT Exchange;  Surgeon: Hollice Espy, MD;  Location: [facility];  Service: Urology;  Laterality: Right;   LOWER EXTREMITY ANGIOGRAPHY Left 05/23/2019   Procedure: LOWER EXTREMITY ANGIOGRAPHY;  Surgeon: Algernon Huxley, MD;  Location: [facility];  Service: Cardiovascular;  Laterality: Left;   LOWER EXTREMITY ANGIOGRAPHY Right 05/30/2019   Procedure: LOWER EXTREMITY ANGIOGRAPHY;  Surgeon: Algernon Huxley, MD;  Location: [place] [facility];  Service: Cardiovascular;  Laterality: Right;   LOWER EXTREMITY ANGIOGRAPHY Right 02/13/2020   Procedure: LOWER EXTREMITY  ANGIOGRAPHY;  Surgeon: Algernon Huxley, MD;  Location: [place];  Service: Cardiovascular;  Laterality: Right;   LOWER EXTREMITY ANGIOGRAPHY Left 02/20/2020   Procedure: LOWER EXTREMITY ANGIOGRAPHY;  Surgeon: Algernon Huxley, MD;  Location: [place] [facility];  Service: Cardiovascular;  Laterality: Left;   PORTA CATH INSERTION N/A 02/28/2019   Procedure: PORTA CATH INSERTION;  Surgeon: Algernon Huxley, MD;  Location: [facility];  Service: Cardiovascular;  Laterality: N/A;   tumor removed       SOCIAL HISTORY: Social History   Socioeconomic History   Marital status: Single    Spouse name: Not on file   Number of children: Not on file   Years of education: Not on file   Highest education level: Not on file  Occupational History   Not on file  Tobacco Use   Smoking status: Every Day    Packs/day: 1.00    Types: Cigarettes   Smokeless tobacco: Never  Vaping Use   Vaping Use: Never used  Substance and Sexual Activity   Alcohol use: Not Currently   Drug use: Not Currently   Sexual activity: Not Currently  Other Topics Concern   Not on file  Social History Narrative    used to live [place]; moved  To  [place]- end of April 2019; in Nursing home; 1pp/day; quit alcohol. 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        Family- dad- prostate ca [at 62y]; brother- 85 died of prostate cancer; brother- 55- no cancers [[place]]; sonGerald Stabs [[place]];[name]-32y prostate ca [facility] [place]]; daughter- 72 [[place]]; another daughter 61 [[place]/addict]. will refer genetics counseling. Given MSI- abnormal; highly suspicious of Lynch syndrome.  Patient's son [name] aware of high possible lynch syndrome.   Social Determinants of Health   Financial Resource Strain: Not on file  Food Insecurity: Not on file  Transportation Needs: Not on file  Physical Activity: Not on file  Stress: Not on file  Social Connections: Not on file  Intimate Partner Violence: Not on file    FAMILY HISTORY: Family History  Problem  Relation Age of Onset   Prostate cancer Father 18   Cancer Brother 69       unsure type   Cancer Paternal Uncle        unsure type   Cancer Maternal Grandmother        unsure type   Cancer Paternal Grandmother        unsure type   Kidney cancer Paternal Grandfather    Cancer Other        unsure types   Leukemia Son    Cancer Son        other cancers, possibly colon    ALLERGIES:  is allergic to penicillins.  MEDICATIONS:  Current Outpatient Medications  Medication Sig Dispense Refill   acetaminophen (TYLENOL) 325 MG tablet Take 650 mg by mouth 3 (three) times daily.      aspirin EC 81 MG tablet Take 1 tablet (81 mg total) by mouth daily. 150 tablet 2   atorvastatin (LIPITOR) 10 MG tablet Take 1 tablet (10 mg total) by mouth daily. 30 tablet 11   Calcium Carbonate Antacid 600 MG chewable tablet Chew 2 tablets (1,200 mg total) by mouth daily. 90 tablet 3   carboxymethylcellulose 1 % ophthalmic solution Apply 1 drop to eye 2 (two) times daily.     cholecalciferol (VITAMIN D3) 25 MCG (1000 UNIT) tablet Take 1 tablet (1,000 Units total) by mouth daily. (Patient not taking: Reported on 07/01/2022) 1 tablet 3   clopidogrel (PLAVIX) 75 MG tablet Take 75 mg by mouth daily.     cyclobenzaprine (FLEXERIL) 10 MG tablet Take 10 mg by mouth at bedtime.     escitalopram (LEXAPRO) 5 MG tablet Take 5 mg by mouth daily.     gabapentin (NEURONTIN) 100 MG capsule Take 100 mg by mouth 3 (three) times daily.     gentamicin ointment (GARAMYCIN) 0.1 % Apply 1 application. topically 3 (three) times daily.     levothyroxine (SYNTHROID) 25 MCG tablet Take 1 tablet (25 mcg total) by mouth daily before breakfast. (Patient not taking: Reported on 07/01/2022) 30 tablet 1   mirtazapine (REMERON) 7.5 MG tablet Take 7.5 mg by mouth at bedtime.     Oxycodone HCl 10 MG TABS Take 10 mg by mouth every 6 (six) hours as needed for pain.     pantoprazole (PROTONIX) 40 MG tablet Take 40 mg by mouth daily. (Patient not  taking: Reported on 07/01/2022)     senna-docusate (SENOKOT-S) 8.6-50 MG tablet Take 1 tablet by mouth daily.     tamsulosin (FLOMAX) 0.4 MG CAPS capsule Take 1 capsule (0.4 mg total) by mouth daily after supper. 30 capsule 3   No current facility-administered medications for  this visit.      Marland Kitchen  PHYSICAL EXAMINATION: ECOG PERFORMANCE STATUS: 1 - Symptomatic but completely ambulatory  There were no vitals filed for this visit.  There were no vitals filed for this visit.      Physical Exam HENT:     Head: Normocephalic and atraumatic.     Mouth/Throat:     Pharynx: No oropharyngeal exudate.  Eyes:     Pupils: Pupils are equal, round, and reactive to light.  Cardiovascular:     Rate and Rhythm: Normal rate and regular rhythm.  Pulmonary:     Effort: No respiratory distress.     Breath sounds: No wheezing.  Abdominal:     General: Bowel sounds are normal. There is no distension.     Palpations: Abdomen is soft. There is no mass.     Tenderness: There is no abdominal tenderness. There is no guarding or rebound.  Musculoskeletal:        General: No tenderness. Normal range of motion.     Cervical back: Normal range of motion and neck supple.  Skin:    General: Skin is warm.     Comments: Bilateral lower extremity ulcerations noted.  Pulses intact bilaterally.  Cystlike lesion noted right elbow-nontender.  No erythema noted.  Neurological:     Mental Status: He is oriented to person, place, and time.     Comments: Sleepy but easily arousable.  Chronic weakness left upper and lower extremity.  Psychiatric:        Mood and Affect: Affect normal.      LABORATORY DATA:  I have reviewed the data as listed Lab Results  Component Value Date   WBC 6.3 07/01/2022   HGB 12.5 (L) 07/01/2022   HCT 39.6 07/01/2022   MCV 78.4 (L) 07/01/2022   PLT 173 07/01/2022   Recent Labs    05/20/22 0913 06/10/22 1002 07/01/22 0855  NA 137 135 136  K 3.9 3.7 4.3  CL 109 105 103   CO2 _0 GLUCOSE 125* 159* 86  BUN _1 CREATININE 1.02 1.02 1.11  CALCIUM 8.0* 8.3* 8.2*  GFRNONAA >60 >60 >60  PROT 7.3 7.2 7.2  ALBUMIN 3.3* 3.2* 3.3*  AST 39 44* 41  ALT _2 ALKPHOS 131* 127* 133*  BILITOT 0.3 0.4 0.5    ASSESSMENT & PLAN:   No problem-specific Assessment & Plan notes found for this encounter.    All questions were answered. The patient knows to call the clinic with any problems, questions or concerns.    Cammie Sickle, MD 07/22/2022 9:30 AM

## 2022-07-23 ENCOUNTER — Ambulatory Visit: Payer: Medicaid Other | Admitting: Internal Medicine

## 2022-08-01 ENCOUNTER — Ambulatory Visit: Payer: Medicaid Other | Admitting: Physician Assistant

## 2022-08-01 ENCOUNTER — Other Ambulatory Visit: Payer: Self-pay | Admitting: Internal Medicine

## 2022-08-01 DIAGNOSIS — Z7189 Other specified counseling: Secondary | ICD-10-CM

## 2022-08-01 DIAGNOSIS — Z515 Encounter for palliative care: Secondary | ICD-10-CM

## 2022-08-01 DIAGNOSIS — C661 Malignant neoplasm of right ureter: Secondary | ICD-10-CM

## 2022-08-07 ENCOUNTER — Non-Acute Institutional Stay: Payer: Medicaid Other | Admitting: Nurse Practitioner

## 2022-08-07 ENCOUNTER — Encounter: Payer: Self-pay | Admitting: Nurse Practitioner

## 2022-08-07 VITALS — BP 135/82 | HR 78 | Temp 97.2°F | Resp 20 | Wt 167.2 lb

## 2022-08-07 DIAGNOSIS — C661 Malignant neoplasm of right ureter: Secondary | ICD-10-CM

## 2022-08-07 DIAGNOSIS — G894 Chronic pain syndrome: Secondary | ICD-10-CM

## 2022-08-07 DIAGNOSIS — Z515 Encounter for palliative care: Secondary | ICD-10-CM

## 2022-08-07 DIAGNOSIS — R630 Anorexia: Secondary | ICD-10-CM

## 2022-08-07 DIAGNOSIS — Z720 Tobacco use: Secondary | ICD-10-CM

## 2022-08-07 NOTE — Progress Notes (Signed)
[facility] Consult Note Telephone: [phone]  Fax: [phone]    Date of encounter: 08/07/22 5:03 PM PATIENT NAME: [place] [id number]   [phone] (home)  DOB: 05/02/1961 MRN: [id number] PRIMARY CARE PROVIDER:    [place]:    Contact Information     Name Relation Home Work Mobile   Bronsen, Serano   [phone]     I met face to face with patient in facility. Palliative Care was asked to follow this patient by consultation request of  [place] to address advance care planning and complex medical decision making. This is a follow up visit.                                  ASSESSMENT AND PLAN / RECOMMENDATIONS: Symptom Management/Plan: 1. ACP: DNR; treat what is treatable including hospitalizations if necessary   2.Anorexia decline secondary to cancer related to urothelial cancer, prostate cancer improving ongoing.  12/30/2021 weight 148.8 lbs 02/03/2022 weight 154.5 lbs 03/13/2022 weight 158.6 lbs 04/01/2022 weight 156.8 lbs 07/31/2022 weight 167.2 lbs 3. Chronic pain reviewed; reviewed chronic pain, medications reviewed; Mr. Everton endorses manageable with current oxycodone, discussed and educated continue to monitor on pain scale, monitor efficacy vs adverse side effects.Will continue to monitor; encouraged to ask when in pain, Continue to follow by psychotherapy and psychiatry for counseling; supportive services.    4. Palliative care encounter; Palliative medicine team will continue to support patient, patient's family, and medical team. Visit consisted of counseling and education dealing with the complex and emotionally intense issues of symptom management and palliative care in the setting of serious and potentially life-threatening illness   5. Smoking cessation; reviewed and declined.           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 Return 4 to 8 weeks or prn. I spent 48 minutes providing this consultation starting at 12:38pm. More than 50% of the time in this consultation was spent in counseling and care coordination. PPS: 50% Chief Complaint: Follow up palliative consult for complex medical decision making HISTORY  OF PRESENT ILLNESS:  Mansfield Dann is a 62 y.o. year old male  with multiple medical problems including   late onset CVA, urothelial cancer, prostate cancer, chronic hepatitis C, hyperlipidemia, colon surgery, protein calorie malnutrition, anxiety, depression. Last Oncology visit 03/17/2022 noted CT scan 01/24/2022 stable thickening of right ureteral area, no obvious progression of disease noted on imaging for high grade urothelial cancer; on Keytruda; proceeded with Keytruda  infusion; received Venofer at this visit; calcium started with vitamin D and levothyroxine 60mg was started; [facility]. Mr. LLichtenwalnertransfers himself to w/c, requires assistance for ADL's. Staff endorses Mr LRidgleycontinues to go to smoking patio daily to smoke, appetite has been good as he is able to feed himself. At present Mr. LFanguyis sitting in the dining area with other residents. Mr LHarband I talked about how he has been feeling, ros, appetite, weights, functional debility, Oncology visit, visit to Wound care center which he will be going tomorrow for f/u visit. Mr LMaiorinotalked about smoking, declines smoking cessation. We talked about facility, his concerns, barriers and coping strategies. Medications reviewed. Medical goals reviewed, no new changes, questions answered. Emotional support, updated staff.    History obtained from review of EMR, discussion with staff and Mr. LPeople  I reviewed available labs, medications, imaging, studies and related documents from the EMR.  Records reviewed and summarized above.  Exam Constitutional: NAD General: pleasant male EYES: lids intact ENMT: oral mucous membranes moist CV: S1S2, RRR Pulmonary: breath sounds clear Abdomen: soft and non tender MSK: w/d dependent, +Edema BLE Skin: warm and dry Neuro:  left hemiplegia,  no cognitive impairment Psych: non-anxious affect, A and O x 3 Thank you for the opportunity to participate in the care of Mr. LFoti Please call our office at [phone] we can be of additional assistance.   Monti Jilek ZIhor Gully NP

## 2022-08-11 ENCOUNTER — Encounter: Payer: Medicaid Other | Attending: Internal Medicine | Admitting: Physician Assistant

## 2022-08-11 DIAGNOSIS — I87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 hypertension (idiopathic) with ulcer and inflammation of left lower extremity: Secondary | ICD-10-CM | POA: Insufficient documentation

## 2022-08-11 DIAGNOSIS — L97322 Non-pressure chronic ulcer of left ankle with fat layer exposed: Secondary | ICD-10-CM | POA: Diagnosis present

## 2022-08-11 DIAGNOSIS — L98492 Non-pressure chronic ulcer of skin of other sites with fat layer exposed: Secondary | ICD-10-CM | POA: Diagnosis not present

## 2022-08-11 DIAGNOSIS — I69354 Hemiplegia and hemiparesis following cerebral infarction affecting left non-dominant side: Secondary | ICD-10-CM | POA: Diagnosis not present

## 2022-08-11 NOTE — Progress Notes (Addendum)
WINFIELD, CABA ([id number]) [id number] Page 1 of 12 Visit Report for 08/11/2022 Chief Complaint Document Details Patient Name: Date of Service: Chad [name], Chad [name] 08/11/2022 10:00 A M Medical Record Number: [id number] Patient Account Number: [id number] Date of Birth/Sex: Treating RN: Dec 30, 1960 (62 y.o. [name], [name] Primary Care Provider: [name] Other Clinician: [name] Referring Provider: Treating Provider/Extender: [name] in Treatment: 10 Information Obtained from: Patient Chief Complaint Left ankle ulcers and left foot ulcers Electronic Signature(s) Signed: 08/11/2022 10:16:32 AM By: Worthy Keeler PA-C Previous Signature: 08/11/2022 10:13:14 AM Version By: Worthy Keeler PA-C Entered By: Worthy Keeler on 08/11/2022 10:16:32 -------------------------------------------------------------------------------- Debridement Details Patient Name: Date of Service: Chad [name] 08/11/2022 10:00 A M Medical Record Number: [id number] Patient Account Number: [id number] Date of Birth/Sex: Treating RN: 02/06/1961 (62 y.o. [name], [name] Primary Care Provider: [name] Other Clinician: [name] Referring Provider: Treating Provider/Extender: [name] in Treatment: 35 Debridement Performed for Assessment: Wound #12 Left,Distal,Medial Ankle Performed By: Physician Tommie Sams., PA-C Debridement Type: Debridement Severity of Tissue Pre Debridement: Fat layer exposed Level of Consciousness (Pre-procedure): Awake and Alert Pre-procedure Verification/Time Out Yes - 10:24 Taken: Start Time: 10:24 T Area Debrided (L x W): otal 2.4 (cm) x 1 (cm) = 2.4 (cm) Tissue and other material debrided: Viable, Non-Viable, Slough, Subcutaneous, Slough Level: Skin/Subcutaneous Tissue Debridement Description: Excisional Instrument: Curette Bleeding: Minimum Hemostasis Achieved: Pressure Response to  Treatment: Procedure was tolerated well Level of Consciousness (Post- Awake and Alert procedure): Chad [name], Chad [name] ([id number]) [id number] Page 2 of 12 Post Debridement Measurements of Total Wound Length: (cm) 2.4 Width: (cm) 1 Depth: (cm) 0.2 Volume: (cm) 0.377 Character of Wound/Ulcer Post Debridement: Stable Severity of Tissue Post Debridement: Fat layer exposed Post Procedure Diagnosis Same as Pre-procedure Electronic Signature(s) Signed: 08/11/2022 6:16:56 PM By: Gretta Cool, BSN, RN, CWS, Kim RN, BSN Signed: 08/12/2022 5:09:00 PM By: Worthy Keeler PA-C Signed: 08/15/2022 9:50:53 AM By: [name] Entered By: [name] on 08/11/2022 10:26:25 -------------------------------------------------------------------------------- HPI Details Patient Name: Date of Service: Chad [name] 08/11/2022 10:00 A M Medical Record Number: [id number] Patient Account Number: [id number] Date of Birth/Sex: Treating RN: 05-23-1961 (62 y.o. Chad [name] Primary Care Provider: [name] Other Clinician: [name] Referring Provider: Treating Provider/Extender: [name] in Treatment: 35 History of Present Illness HPI Description: 10/08/18 on evaluation today patient actually presents to our office for initial evaluation concerning wounds that he has of the bilateral lower extremities. 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 He also has bilateral venous stasis. He apparently has been homeless currently following discharge from the hospital apparently he has been placed at [facility] which is is a skilled nursing facility locally. Nonetheless fortunately he does not show any signs of infection at this time which is good news. In fact several of the wound actually appears to be  showing some signs of improvement already in my pinion. There are a couple areas in the left leg in particular there likely gonna require some sharp debridement to help clear away some necrotic tissue and help with more sufficient healing. No fevers, chills, nausea, or vomiting noted at this time. 10/15/18 on evaluation today patient actually appears to be doing very well in regard to his bilateral lower extremities. He's been tolerating the dressing changes without complication.  Fortunately there does not appear to be any evidence of active infection at this time which is great news. Overall I'm actually very pleased with how this has progressed in just one visits time. Readmission: 08/14/2020 upon evaluation today patient presents for re-evaluation here in our clinic. He is having issues with his left ankle region as well as his right toe and his right heel. He tells me that the toe and heel actually began as a area that was itching that he was scratching and then subsequently opened up into wounds. These may have been abscess areas I presume based on what I am seeing currently. With regard to his left ankle region he tells me this was a similar type occurrence although he does have venous stasis this very well may be more of a venous leg ulcer more than anything. Nonetheless I do believe that the patient would benefit from appropriate and aggressive wound care to try to help get things under better control here. He does have history of a stroke on the left side affecting him to some degree there that he is able to stand although he does have some residual weakness. Otherwise again the patient does have chronic venous insufficiency as previously noted. His arterial studies most recently obtained showed that he had an ABI on the right of 1.16 with a TBI of 0.52 and on the left and ABI of 1.14 with a TBI of 0.81. That was obtained on 06/19/2020. 08/28/2020 upon evaluation today patient appears to be doing  decently well in regard to his wounds in general. He has been tolerating the dressing changes without complication. Fortunately there does not appear to be any signs of active infection which is great news. With that being said I think the [facility] is doing a good job I would recommend that we likely continue with that currently. 09/11/2020 upon evaluation today patient's wounds did not appear to be doing too poorly but again he is not really showing signs of significant improvement with regard to any of the wounds on the right. None of them have Hydrofera Blue on them I am not exactly sure why this is not being followed as the facility did not contact us to let us know of any issues with obtaining dressings or otherwise. With that being said he is supposed to be using Hydrofera Blue on both of the wounds on the right foot as well as the ankle wound on the left side. 09/18/2020 upon evaluation today patient appears to be doing poorly with regard to his wounds. Again right now the left ankle in particular showed signs of extreme maceration. Apparently he was told by someone with staff at [facility] they could not get the [facility]. With that being said this is something that is never been relayed to [place] one way or another. Also the patient subsequently has not supposed to have a border gauze dressing on. He should have an ABD pad and roll gauze to secure as this drains much too much just to have a border gauze dressing to cover. Nonetheless the fact that they are not using the appropriate dressing is directly causing deterioration of the left ankle wound it is significantly worse today compared to what it was previous. ZYIR, GASSERT ([id number]) [id number] Page 3 of 12 attempt to call [facility] while the patient was here I called three times and got no one to even pick up the phone. After this I had my for an office  coordinator call and she  was able to finally get through and leave a message with the D ON as of dictation of this note which is roughly about an hour and a half later I still have not been able to speak with anyone at the facility. 09/25/2020 upon evaluation today patient actually showing signs of good improvement which is excellent news. He has been tolerating the dressing changes without complication. Fortunately there is no signs of active infection which is great news. No fevers, chills, nausea, vomiting, or diarrhea. I do feel like the facility has been doing a much better job at taking care of him as far as the dressings are concerned. However the director of nursing never did call me back. 10/09/2020 upon evaluation today patient appears to be doing well with regard to his wound. The toe ulcer did require some debridement but the other 2 areas actually appear to be doing quite well. 10/19/2020 upon evaluation today patient actually appears to be doing very well in regard to his wounds. In fact the heel does appear to be completely healed. The toe is doing better in the medial ankle on the left is also doing better. Overall I think he is headed in the right direction. 10/26/2020 upon evaluation today patient appears to be doing well with regard to his wound. He is showing signs of improvement which is great news and overall I am very pleased with where things stand today. No fevers, chills, nausea, vomiting, or diarrhea. 11/02/2020 upon evaluation today patient appears to be doing well with regard to his wounds. He has been tolerating the dressing changes without complication overall I am extremely pleased with where things stand today. He in regard to the toe is almost completely healed and the medial ankle on the left is doing much better. 11/09/2020 upon evaluation today patient appears to be doing a little poorly in regard to his left medial ankle ulcer. Fortunately there does not appear to be any signs of systemic  infection but unfortunately locally he does appear to be infected in fact he has blue-green drainage consistent with Pseudomonas. 11/16/2020 upon evaluation today patient appears to be doing well with regard to his wound. It actually appears to be doing better. I did place him on gentamicin cream since the Cipro was actually resistant even though he was positive for Pseudomonas on culture. Overall I think that he does seem to be doing better though I am unsure whether or not they have actually been putting the cream on. The patient is not sure that we did talk to the nurse directly and she was going to initiate that treatment. Fortunately there does not appear to be any signs of active infection at this time. No fevers, chills, nausea, vomiting, or diarrhea. 4/28; the area on the right second toe is close to healed. Left medial ankle required debridement 12/07/2020 upon evaluation today patient appears to be doing well with regard to his wounds. In fact the right second toe appears to be completely healed which is great news. Fortunately there does not appear to be any signs of active infection at this time which is also great news. I think we can probably discontinue the gentamicin on top of everything else. 12/14/2020 upon evaluation today patient appears to be doing well with regard to his wound. He is making good progress and overall very pleased with where things stand today. There is no signs of active infection at this time which is great news. 12/28/2020 upon  evaluation today patient appears to be doing well with regard to his wounds. He has been tolerating the dressing changes without complication. Fortunately there is no signs of active infection at this time. No fevers, chills, nausea, vomiting, or diarrhea. 12/28/2020 upon evaluation today patient's wound bed actually showed signs of excellent improvement. He has great epithelization and granulation I do not see any signs of infection overall I  am extremely pleased with where things stand at this point. No fevers, chills, nausea, vomiting, or diarrhea. 01/11/2021 upon evaluation today patient appears to be doing well with regard to his wound on his leg. He has been tolerating the dressing changes without complication. Fortunately there does not appear to be any signs of active infection which is great news. No fevers, chills, nausea, vomiting, or diarrhea. 01/25/2021 upon evaluation today patient appears to be doing well with regard to his wound. He has been tolerating the dressing changes without complication. Fortunately the collagen seems to be doing a great job which is excellent news. No fevers, chills, nausea, vomiting, or diarrhea. 02/08/2021 upon evaluation today patient's wound is actually looking a little bit worse especially in the periwound compared to previous. Fortunately there does not appear to be any signs of infection which is great news with that being said he does have some irritation around the periphery of the wound which has me more concerned. He actually had a dressing on that had not been changed in 3 days. He also is supposed to have daily dressing changes. With regard to the dressing applied he had a silver alginate dressing and silver collagen is what is recommended and ordered. He also had no Desitin around the edges of the wound in the periwound region although that is on the order inspect to be done as well. In general I was very concerned I did contact [facility] actually spoke with Magda Paganini who is the scheduling individual and subsequently she stated that she would pass the information to the D ON apparently the D ON was not available to talk to me when I call today. 02/18/2021 upon evaluation today patient's wound is actually showing signs of improvement. Fortunately there does not appear to be any evidence of infection which is great news overall I am extremely pleased with where things stand today. No  fevers, chills, nausea, vomiting, or diarrhea. 8/3; patient presents for 1 week follow-up. He has no issues or complaints today. He denies signs of infection. 03/11/2021 upon evaluation today patient appears to be doing well with regard to his wound. He does have a little bit of slough noted on the surface of the wound but fortunately there does not appear to be any signs of active infection at this time. No fevers, chills, nausea, vomiting, or diarrhea. 03/18/2021 upon evaluation today patient appears to be doing well with regard to his wound. He has been tolerating the dressing changes without complication. There was a little irritation more proximal to where the wound was that was not noted last week but nonetheless this is very superficial just seems to be more irritation we just need to make sure to put a good amount of the zinc over the area in my opinion. Otherwise he does not seem to be doing significantly worse at all which is great news. 03/25/2021 upon evaluation today patient appears to be doing well with regard to his wound. He is going require some sharp debridement today to clear with some of the necrotic debris. I did perform this today  without complication postdebridement wound bed appears to be doing much better this is great news. 04/08/2021 upon evaluation today patient appears to be doing decently well in regard to his wound although the overall measurement is not significantly smaller compared to previous. It is gone down a little bit but still the facility continues to not really put the appropriate dressings in place in fact he was supposed to have collagen we think he probably had more of an allergy to At this point. Fortunately there does not appear to be any signs of active infection systemically though locally I do not see anything on initial visualization either as far as erythema or warmth. 04/15/2021 upon evaluation today patient appears to be doing well with regard to his  wound. He is actually showing signs of improvement. I did place him on antibiotics last week, Cipro. He has been taking that 2 times a day and seems to be tolerating it very well. I do not see any evidence of worsening and in fact the overall appearance of the wound is smaller today which is also great news. 9/26; left medial ankle chronic venous insufficiency wound is improved. Using Hydrofera Blue 10/10; left medial ankle chronic venous insufficiency. Wound has not changed much in appearance completely nonviable surface. Apparently there have been problems getting the right product on the wound at the facility although he came in with [facility] on today 05/14/2021 upon evaluation today patient appears to be doing well with regard to his wound. I think he is making progress here which is good news. Fortunately there does not appear to be any signs of active infection at this time. No fevers, chills, nausea, vomiting, or diarrhea. 05/20/2021 upon evaluation today patient appears to be doing well with regard to his wound. He is showing signs of good improvement which is great news. There does not appear to be any evidence of active infection which is also excellent news. No fevers, chills, nausea, vomiting, or diarrhea. LEVELLE, EDELEN ([id number]) [id number] Page 4 of 12 05/28/2021 upon evaluation today patient appears to be doing quite well. There does not appear to be any signs of active infection at this time which is great news. Overall I am extremely pleased with where things stand today. I think he is headed in the right direction. 06/11/2021 upon evaluation today patient appears to be doing well with regard to his left ankle ulcer and poorly in regard to the toe ulcer on the second toe right foot. This appears to show signs of joint exposure. Apparently this has been present for 1 to 2 months although he kept forgetting to tell me about it. That is unfortunate  as right now it definitely appears to be doing significantly worse than what I would like to see. There does not appear to be any signs of active infection systemically though locally I am concerned about the possibility of infection the toe is quite red. Again no one from the facility ever contacted [place] to advise that this was going on in the interim either. 06/17/2021 upon evaluation today patient presents for follow-up I did review his x-ray which showed a navicular bone fracture I am unsure of the chronicity of this. Subsequently he also had osteomyelitis of the toe which was what I was more concerned about this did not show up on x-ray but did show up on the pathology scrapings. This was listed as acute osteomyelitis. Nonetheless at this point I think that the antibiotic treatment is the best regimen  to go with currently. The patient is in agreement with that plan. Nonetheless he has initially 30 days of doxycycline off likely extend that towards the end of the treatment cycle that will be around the middle of December for an additional 2 weeks. That all depends on how well he continues to heal. Nonetheless based on what I am seeing in the foot I did want a proceed with an MRI as well which I think will be helpful to identify if there is anything else that needs to be addressed from the standpoint of infection. 06/24/2021 upon evaluation today patient appears to be doing pretty well in regards to his wounds. I think both are actually showing signs of improvement which is good I did review his MRI today which did show signs of osteomyelitis of the middle and proximal phalanx on his right foot of the affected toe. With that being said this is actually showing signs of significant improvement today already with the antibiotic therapy I think the redness is also improved. Overall I think that we just need to give this some time with appropriate wound care we will see how things go potentially  hyperbarics could be considered. 07/02/2021 upon inspection today patient actually appears to be doing well in regard to his left ankle which is getting very close to complete resolution of pleased in that regard. Unfortunately he is continuing to have issues with his second toe right foot and this seems to still be very painful for him. Recommend he try something different from the standpoint of antibiotics. 07/15/2021 upon evaluation today patient appears to be doing actually pretty well in regard to his foot. This is actually showing signs of significant improvement which is great news. Overall I feel like the patient is improving both in regard to the second toe as well as the ankle on the left. With that being said the biggest issue that I do see currently is that he is needing to have a refill of the doxycycline that we previously treated him with. He also did see podiatry they are not going to recommend any amputation at this point since he seems to be doing quite well. For that reason we just need to keep things under control from an infection standpoint. 08/01/2021 upon evaluation today patient appears to be doing well with regard to his wound. He has been tolerating the dressing changes without complication. Fortunately there does not appear to be any evidence of active infection locally nor systemically at this point. In fact I think everything is doing excellent in fact his second toe on the right foot is almost healed and the ankle on the left ankle region is actually very close to being healed as well. 08/08/2021 upon evaluation today patient appears to be doing well with regard to his wound. He has been tolerating the dressing changes without complication. Fortunately I do not see any signs of active infection at this time. Readmission: 12-06-2021 upon evaluation today patient presents for reevaluation here in the clinic he does tell me that he was being seen in facility at [facility] by a provider that was coming in. He is not sure who this was. He tells me however that the wound seems to have gotten worse even compared to where it was when we last saw him at this point. With that being said I do believe that he is likely going need ongoing wound care here in the clinic and I do believe that we need to  be the ones to frontline this since his wound does seem to be getting worse not better at this point. He voiced understanding. He is also in agreement with this plan and feels more comfortable coming here she tells me. Patient's medical history really has not changed since his prior admission he was only gone since January. 12-27-2021 upon evaluation today patient appears to be doing well with regard to his wound they did run out of the [facility] so they did not put anything on just an ABD pad with gentamicin. Still we are seeing some signs of good improvement here with some new epithelization which is great news. 01-10-2022 upon evaluation today patient appears to be doing well with regard to his wounds and he is going require some sharp debridement but overall seems to be making good progress. Fortunately I do not see any evidence of active infection locally or systemically at this time which is great news. 01-24-2022 upon evaluation today patient appears to be doing well with regard to his wound. The facility actually came and dropped him off early and he had another appointment at the hospital and then they just brought him over here and this was still hours before his appointment this afternoon. For that reason we did do our best to work him in this morning and fortunately had some space to make this happen. With that being said patient's wound does seem to be making progress here and I am very pleased in that regard I do not see any signs of active infection locally or systemically at this time. 02-07-2022 patient appears to be doing well currently in regard to his  wounds. In fact one of them the more proximal is healed the distal is still open but seems to be doing excellent. Fortunately I do not see any evidence of active infection locally or systemically at this time which is great news. No fevers, chills, nausea, vomiting, or diarrhea. 7/27; left medial ankle venous. Improving per our intake nurse. We are using Hydrofera Blue under Tubigrip compression. They are changing that at his facility 03-06-2022 upon evaluation today patient appears to be doing well with regard to the his wound he is can require some sharp debridement but seems to be making excellent progress. Fortunately I do not see any evidence of active infection locally or systemically which is great news. 03-27-2022 upon evaluation today patient's wound is actually showing signs of excellent improvement. Fortunately I see no signs of active infection locally or systemically at this time which is great news. No fevers, chills, nausea, vomiting, or diarrhea. 04-21-2022 upon evaluation today patient appears to be doing excellent in regard to his wound in fact this is very close to resolution based on what I am seeing. I do not see any evidence of active infection locally or systemically at this time which is great news and overall I am extremely pleased with where we are today. 05-05-2022 upon evaluation today patient's wound actually appears to potentially be completely healed. Fortunately I do not see any evidence of active infection at this time which is great news and overall I am very pleased I think this needs a little time to toughen up but other than that I really do believe were doing quite well. He is very pleased to hear this its been a long time coming. 05-19-2022 upon evaluation today patient appears to be doing well currently in regard to his wound. He in fact we were hoping will be completely healed and  closed but it still has a very tiny area right in the center which is still  continuing to drain. Fortunately I do not see any signs of infection locally or systemically which is great news. 11/6; this wound is close to completely" he comes in this week with some erythema at 1 edge of this which looks like something was rubbing on the area. Other than that no evidence of infection 06-16-2022 upon evaluation today patient appears to be doing well currently in regard to his ankle ulcer. This is very close to being healed but still has a small area in the central portion which is not. Fortunately there does not appear to be any signs of infection locally or systemically which is great news. No fevers, chills, nausea, vomiting, or diarrhea. Chad [name], Chad [name] ([id number]) [id number] Page 5 of 12 06-30-2022 upon evaluation patient's wound pretty much appears to be almost completely closed. In fact it may even be closed but there are still a lot of skin irritation around the edges of the wound and I just do not feel great about releasing them yet I would like to continue to monitor this just a little bit longer before getting to that point. He is not opposed to this and in fact is happy to continue to come in if need be in order to make sure that things are moving in the right direction he definitely does not want to backtrack. 07-14-2022 upon evaluation today patient appears to be doing well currently in regard to his wound. Has been tolerating the dressing changes without complication. Fortunately I see no evidence of active infection locally nor systemically which is great news. No fevers, chills, nausea, vomiting, or diarrhea. 08-11-2022 upon evaluation today patient appears to be doing a little worse than last time I saw him although it has been a month. Unfortunately he was not able to be seen due to the fact that he was quarantined in the facility with COVID. Subsequently he tells me that he mention to the facility staff several times about taking it  off over the past several weeks but they continue to tell him that someone have to get in touch with [place] here although nobody ever did until Friday when the nurse manager called to have that documented in the communication notes. With that being said unfortunately this means that he had a wrap on that he was supposed to have on for only 1 week for ended up being on four 1 month essentially. I am glad that there is nothing worse than what we see currently to be perfectly honest. Electronic Signature(s) Signed: 08/11/2022 2:22:36 PM By: Worthy Keeler PA-C Previous Signature: 08/11/2022 10:44:32 AM Version By: Worthy Keeler PA-C Entered By: Worthy Keeler on 08/11/2022 14:22:36 -------------------------------------------------------------------------------- Physical Exam Details Patient Name: Date of Service: Chad [name], Chad [name] 08/11/2022 10:00 A M Medical Record Number: [id number] Patient Account Number: [id number] Date of Birth/Sex: Treating RN: 11-01-1960 (62 y.o. Chad [name] Primary Care Provider: [name] Other Clinician: [name] Referring Provider: Treating Provider/Extender: [name] in Treatment: 67 Constitutional Well-nourished and well-hydrated in no acute distress. Respiratory normal breathing without difficulty. Psychiatric this patient is able to make decisions and demonstrates good insight into disease process. Alert and Oriented x 3. pleasant and cooperative. Notes Upon inspection patient's wound bed actually showed signs of good granulation and epithelization at this point. Fortunately there does not appear to be any signs of active infection which is great news and  overall I am extremely pleased with where we stand today. No fevers, chills, nausea, vomiting, or diarrhea. Electronic Signature(s) Signed: 08/11/2022 2:23:33 PM By: Worthy Keeler PA-C Previous Signature: 08/11/2022 10:44:44 AM Version By: Worthy Keeler PA-C Entered By:  Worthy Keeler on 08/11/2022 14:23:32 -------------------------------------------------------------------------------- Physician Orders Details Patient Name: Date of Service: Chad [name], Chad [name] 08/11/2022 10:00 [place] ([id number]) [id number] Page 6 of 12 Medical Record Number: [id number] Patient Account Number: [id number] Date of Birth/Sex: Treating RN: January 29, 1961 (62 y.o. 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 Bathing/ Shower/ Hygiene May shower with wound dressing protected with water repellent cover or cast protector. No tub bath. Anesthetic (Use 'Patient Medications' Section for Anesthetic Order Entry) Lidocaine applied to wound bed Edema Control - Lymphedema / Segmental Compressive Device / Other Tubigrip single layer applied. - Tubi C single layer Elevate legs to the level of the heart and pump ankles as often as possible Elevate leg(s) parallel to the floor when sitting. Medications-Please add to medication list. Other: - apply AandD ointment to leg, do not apply to wound Wound Treatment Wound #12 - Ankle Wound Laterality: Left, Medial, Distal Cleanser: Soap and Water Every Other Day/30 Days Discharge Instructions: Gently cleanse wound with antibacterial soap, rinse and pat dry prior to dressing wounds Cleanser: Wound  Cleanser Every Other Day/30 Days Discharge Instructions: Wash your hands with soap and water. Remove old dressing, discard into plastic bag and place into trash. Cleanse the wound with Wound Cleanser prior to applying a clean dressing using gauze sponges, not tissues or cotton balls. Do not scrub or use excessive force. Pat dry using gauze sponges, not tissue or cotton balls. Peri-Wound Care: AandD Ointment Every Other Day/30 Days Discharge Instructions: Apply AandD Ointment to areas of redness Prim Dressing: Hydrofera Blue Ready Transfer Foam, 4x5 (in/in) Every Other Day/30 Days ary Discharge Instructions: Apply Hydrofera Blue Ready to wound bed as directed Secondary Dressing: ABD Pad 5x9 (in/in) Every Other Day/30 Days Discharge Instructions: Cover with ABD pad Secured With: Kerlix Roll Sterile or Non-Sterile 6-ply 4.5x4 (yd/yd) Every Other Day/30 Days Discharge Instructions: Apply Kerlix as directed Electronic Signature(s) Signed: 08/12/2022 5:09:00 PM By: Worthy Keeler PA-C Signed: 08/15/2022 9:50:53 AM By: [name] Entered By: [name] on 08/11/2022 10:48:35 Chad [name] ([id number]) [id number] Page 7 of 12 -------------------------------------------------------------------------------- Problem List Details Patient Name: Date of Service: Chad [name], Chad [name] 08/11/2022 10:00 A M Medical Record Number: [id number] Patient Account Number: [id number] Date of Birth/Sex: Treating RN: 08-14-1960 (62 y.o. Chad [name] Primary Care Provider: [name] Other Clinician: [name] Referring Provider: Treating Provider/Extender: [name] in Treatment: 35 Active Problems ICD-10 Encounter Code Description Active Date MDM Diagnosis 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 (idiopathic) with ulcer and inflammation of left 12/06/2021 No Yes lower extremity L97.322 Non-pressure chronic ulcer of left ankle with fat layer exposed  12/06/2021 No Yes L98.492 Non-pressure chronic ulcer of skin of other sites with fat layer exposed 08/11/2022 No Yes I69.354 Hemiplegia and hemiparesis following cerebral  infarction affecting left non- 12/06/2021 No Yes dominant side Inactive Problems Resolved Problems Electronic Signature(s) Signed: 08/11/2022 10:15:44 AM By: Worthy Keeler PA-C Previous Signature: 08/11/2022 10:03:41 AM Version By: Worthy Keeler PA-C Entered By: Worthy Keeler on 08/11/2022 10:15:44 -------------------------------------------------------------------------------- Progress Note Details Patient Name: Date of Service: Chad [name] 08/11/2022 10:00 A M Medical Record Number: [id number] Patient Account Number: [id number] Date of Birth/Sex: Treating RN: Aug 26, 1960 (62 y.o. Chad [name] Primary Care Provider: [name] Other Clinician: [name] Referring Provider: Treating Provider/Extender: [name] Chad [name], Chad [name] ([id number]) [id number] Page 8 of 12 Weeks in Treatment: 35 Subjective Chief Complaint Information obtained from Patient Left ankle ulcers and left foot ulcers History of Present Illness (HPI) 10/08/18 on evaluation today patient actually presents to our office for initial evaluation concerning wounds that he has of the bilateral lower extremities. 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 He also has bilateral venous stasis. He apparently has been homeless currently following discharge from the hospital apparently he has been placed at [facility] which is is a skilled nursing facility locally. Nonetheless fortunately he does not show any signs of infection at this time which is good news. In fact several of the wound actually appears to be showing some signs of improvement  already in my pinion. There are a couple areas in the left leg in particular there likely gonna require some sharp debridement to help clear away some necrotic tissue and help with more sufficient healing. No fevers, chills, nausea, or vomiting noted at this time. 10/15/18 on evaluation today patient actually appears to be doing very well in regard to his bilateral lower extremities. He's been tolerating the dressing changes without complication. Fortunately there does not appear to be any evidence of active infection at this time which is great news. Overall I'm actually very pleased with how this has progressed in just one visits time. Readmission: 08/14/2020 upon evaluation today patient presents for re-evaluation here in our clinic. He is having issues with his left ankle region as well as his right toe and his right heel. He tells me that the toe and heel actually began as a area that was itching that he was scratching and then subsequently opened up into wounds. These may have been abscess areas I presume based on what I am seeing currently. With regard to his left ankle region he tells me this was a similar type occurrence although he does have venous stasis this very well may be more of a venous leg ulcer more than anything. Nonetheless I do believe that the patient would benefit from appropriate and aggressive wound care to try to help get things under better control here. He does have history of a stroke on the left side affecting him to some degree there that he is able to stand although he does have some residual weakness. Otherwise again the patient does have chronic venous insufficiency as previously noted. His arterial studies most recently obtained showed that he had an ABI on the right of 1.16 with a TBI of 0.52 and on the left and ABI of 1.14 with a TBI of 0.81. That was obtained on 06/19/2020. 08/28/2020 upon evaluation today patient appears to be doing decently well in regard to his  wounds in general. He has been tolerating the dressing changes without complication. Fortunately  there does not appear to be any signs of active infection which is great news. With that being said I think the [facility] is doing a good job I would recommend that we likely continue with that currently. 09/11/2020 upon evaluation today patient's wounds did not appear to be doing too poorly but again he is not really showing signs of significant improvement with regard to any of the wounds on the right. None of them have Hydrofera Blue on them I am not exactly sure why this is not being followed as the facility did not contact us to let us know of any issues with obtaining dressings or otherwise. With that being said he is supposed to be using Hydrofera Blue on both of the wounds on the right foot as well as the ankle wound on the left side. 09/18/2020 upon evaluation today patient appears to be doing poorly with regard to his wounds. Again right now the left ankle in particular showed signs of extreme maceration. Apparently he was told by someone with staff at [facility] they could not get the [facility]. With that being said this is something that is never been relayed to [place] one way or another. Also the patient subsequently has not supposed to have a border gauze dressing on. He should have an ABD pad and roll gauze to secure as this drains much too much just to have a border gauze dressing to cover. Nonetheless the fact that they are not using the appropriate dressing is directly causing deterioration of the left ankle wound it is significantly worse today compared to what it was previous. I did attempt to call [facility] while the patient was here I called three times and got no one to even pick up the phone. After this I had my for an office coordinator call and she was able to finally get through and leave a message with the D ON as of dictation of this note which is roughly  about an hour and a half later I still have not been able to speak with anyone at the facility. 09/25/2020 upon evaluation today patient actually showing signs of good improvement which is excellent news. He has been tolerating the dressing changes without complication. Fortunately there is no signs of active infection which is great news. No fevers, chills, nausea, vomiting, or diarrhea. I do feel like the facility has been doing a much better job at taking care of him as far as the dressings are concerned. However the director of nursing never did call me back. 10/09/2020 upon evaluation today patient appears to be doing well with regard to his wound. The toe ulcer did require some debridement but the other 2 areas actually appear to be doing quite well. 10/19/2020 upon evaluation today patient actually appears to be doing very well in regard to his wounds. In fact the heel does appear to be completely healed. The toe is doing better in the medial ankle on the left is also doing better. Overall I think he is headed in the right direction. 10/26/2020 upon evaluation today patient appears to be doing well with regard to his wound. He is showing signs of improvement which is great news and overall I am very pleased with where things stand today. No fevers, chills, nausea, vomiting, or diarrhea. 11/02/2020 upon evaluation today patient appears to be doing well with regard to his wounds. He has been tolerating the dressing changes without complication overall I am extremely pleased with where things  stand today. He in regard to the toe is almost completely healed and the medial ankle on the left is doing much better. 11/09/2020 upon evaluation today patient appears to be doing a little poorly in regard to his left medial ankle ulcer. Fortunately there does not appear to be any signs of systemic infection but unfortunately locally he does appear to be infected in fact he has blue-green drainage consistent with  Pseudomonas. 11/16/2020 upon evaluation today patient appears to be doing well with regard to his wound. It actually appears to be doing better. I did place him on gentamicin cream since the Cipro was actually resistant even though he was positive for Pseudomonas on culture. Overall I think that he does seem to be doing better though I am unsure whether or not they have actually been putting the cream on. The patient is not sure that we did talk to the nurse directly and she was going to initiate that treatment. Fortunately there does not appear to be any signs of active infection at this time. No fevers, chills, nausea, vomiting, or diarrhea. 4/28; the area on the right second toe is close to healed. Left medial ankle required debridement 12/07/2020 upon evaluation today patient appears to be doing well with regard to his wounds. In fact the right second toe appears to be completely healed which is great news. Fortunately there does not appear to be any signs of active infection at this time which is also great news. I think we can probably discontinue the gentamicin on top of everything else. 12/14/2020 upon evaluation today patient appears to be doing well with regard to his wound. He is making good progress and overall very pleased with where things stand today. There is no signs of active infection at this time which is great news. 12/28/2020 upon evaluation today patient appears to be doing well with regard to his wounds. He has been tolerating the dressing changes without complication. Fortunately there is no signs of active infection at this time. No fevers, chills, nausea, vomiting, or diarrhea. 12/28/2020 upon evaluation today patient's wound bed actually showed signs of excellent improvement. He has great epithelization and granulation I do not see any signs of infection overall I am extremely pleased with where things stand at this point. No fevers, chills, nausea, vomiting, or  diarrhea. Chad [name], Chad [name] ([id number]) [id number] Page 9 of 12 01/11/2021 upon evaluation today patient appears to be doing well with regard to his wound on his leg. He has been tolerating the dressing changes without complication. Fortunately there does not appear to be any signs of active infection which is great news. No fevers, chills, nausea, vomiting, or diarrhea. 01/25/2021 upon evaluation today patient appears to be doing well with regard to his wound. He has been tolerating the dressing changes without complication. Fortunately the collagen seems to be doing a great job which is excellent news. No fevers, chills, nausea, vomiting, or diarrhea. 02/08/2021 upon evaluation today patient's wound is actually looking a little bit worse especially in the periwound compared to previous. Fortunately there does not appear to be any signs of infection which is great news with that being said he does have some irritation around the periphery of the wound which has me more concerned. He actually had a dressing on that had not been changed in 3 days. He also is supposed to have daily dressing changes. With regard to the dressing applied he had a silver alginate dressing and silver collagen is what is recommended and  ordered. He also had no Desitin around the edges of the wound in the periwound region although that is on the order inspect to be done as well. In general I was very concerned I did contact [facility] actually spoke with Magda Paganini who is the scheduling individual and subsequently she stated that she would pass the information to the D ON apparently the D ON was not available to talk to me when I call today. 02/18/2021 upon evaluation today patient's wound is actually showing signs of improvement. Fortunately there does not appear to be any evidence of infection which is great news overall I am extremely pleased with where things stand today. No fevers, chills,  nausea, vomiting, or diarrhea. 8/3; patient presents for 1 week follow-up. He has no issues or complaints today. He denies signs of infection. 03/11/2021 upon evaluation today patient appears to be doing well with regard to his wound. He does have a little bit of slough noted on the surface of the wound but fortunately there does not appear to be any signs of active infection at this time. No fevers, chills, nausea, vomiting, or diarrhea. 03/18/2021 upon evaluation today patient appears to be doing well with regard to his wound. He has been tolerating the dressing changes without complication. There was a little irritation more proximal to where the wound was that was not noted last week but nonetheless this is very superficial just seems to be more irritation we just need to make sure to put a good amount of the zinc over the area in my opinion. Otherwise he does not seem to be doing significantly worse at all which is great news. 03/25/2021 upon evaluation today patient appears to be doing well with regard to his wound. He is going require some sharp debridement today to clear with some of the necrotic debris. I did perform this today without complication postdebridement wound bed appears to be doing much better this is great news. 04/08/2021 upon evaluation today patient appears to be doing decently well in regard to his wound although the overall measurement is not significantly smaller compared to previous. It is gone down a little bit but still the facility continues to not really put the appropriate dressings in place in fact he was supposed to have collagen we think he probably had more of an allergy to At this point. Fortunately there does not appear to be any signs of active infection systemically though locally I do not see anything on initial visualization either as far as erythema or warmth. 04/15/2021 upon evaluation today patient appears to be doing well with regard to his wound. He is  actually showing signs of improvement. I did place him on antibiotics last week, Cipro. He has been taking that 2 times a day and seems to be tolerating it very well. I do not see any evidence of worsening and in fact the overall appearance of the wound is smaller today which is also great news. 9/26; left medial ankle chronic venous insufficiency wound is improved. Using Hydrofera Blue 10/10; left medial ankle chronic venous insufficiency. Wound has not changed much in appearance completely nonviable surface. Apparently there have been problems getting the right product on the wound at the facility although he came in with [facility] on today 05/14/2021 upon evaluation today patient appears to be doing well with regard to his wound. I think he is making progress here which is good news. Fortunately there does not appear to be any signs of active infection  at this time. No fevers, chills, nausea, vomiting, or diarrhea. 05/20/2021 upon evaluation today patient appears to be doing well with regard to his wound. He is showing signs of good improvement which is great news. There does not appear to be any evidence of active infection which is also excellent news. No fevers, chills, nausea, vomiting, or diarrhea. 05/28/2021 upon evaluation today patient appears to be doing quite well. There does not appear to be any signs of active infection at this time which is great news. Overall I am extremely pleased with where things stand today. I think he is headed in the right direction. 06/11/2021 upon evaluation today patient appears to be doing well with regard to his left ankle ulcer and poorly in regard to the toe ulcer on the second toe right foot. This appears to show signs of joint exposure. Apparently this has been present for 1 to 2 months although he kept forgetting to tell me about it. That is unfortunate as right now it definitely appears to be doing significantly worse than what I would like to see.  There does not appear to be any signs of active infection systemically though locally I am concerned about the possibility of infection the toe is quite red. Again no one from the facility ever contacted [place] to advise that this was going on in the interim either. 06/17/2021 upon evaluation today patient presents for follow-up I did review his x-ray which showed a navicular bone fracture I am unsure of the chronicity of this. Subsequently he also had osteomyelitis of the toe which was what I was more concerned about this did not show up on x-ray but did show up on the pathology scrapings. This was listed as acute osteomyelitis. Nonetheless at this point I think that the antibiotic treatment is the best regimen to go with currently. The patient is in agreement with that plan. Nonetheless he has initially 30 days of doxycycline off likely extend that towards the end of the treatment cycle that will be around the middle of December for an additional 2 weeks. That all depends on how well he continues to heal. Nonetheless based on what I am seeing in the foot I did want a proceed with an MRI as well which I think will be helpful to identify if there is anything else that needs to be addressed from the standpoint of infection. 06/24/2021 upon evaluation today patient appears to be doing pretty well in regards to his wounds. I think both are actually showing signs of improvement which is good I did review his MRI today which did show signs of osteomyelitis of the middle and proximal phalanx on his right foot of the affected toe. With that being said this is actually showing signs of significant improvement today already with the antibiotic therapy I think the redness is also improved. Overall I think that we just need to give this some time with appropriate wound care we will see how things go potentially hyperbarics could be considered. 07/02/2021 upon inspection today patient actually appears to be doing well  in regard to his left ankle which is getting very close to complete resolution of pleased in that regard. Unfortunately he is continuing to have issues with his second toe right foot and this seems to still be very painful for him. Recommend he try something different from the standpoint of antibiotics. 07/15/2021 upon evaluation today patient appears to be doing actually pretty well in regard to his foot. This is actually showing  signs of significant improvement which is great news. Overall I feel like the patient is improving both in regard to the second toe as well as the ankle on the left. With that being said the biggest issue that I do see currently is that he is needing to have a refill of the doxycycline that we previously treated him with. He also did see podiatry they are not going to recommend any amputation at this point since he seems to be doing quite well. For that reason we just need to keep things under control from an infection standpoint. 08/01/2021 upon evaluation today patient appears to be doing well with regard to his wound. He has been tolerating the dressing changes without complication. Fortunately there does not appear to be any evidence of active infection locally nor systemically at this point. In fact I think everything is doing excellent in fact his second toe on the right foot is almost healed and the ankle on the left ankle region is actually very close to being healed as well. 08/08/2021 upon evaluation today patient appears to be doing well with regard to his wound. He has been tolerating the dressing changes without complication. Fortunately I do not see any signs of active infection at this time. Readmission: 12-06-2021 upon evaluation today patient presents for reevaluation here in the clinic he does tell me that he was being seen in facility at [facility] by a provider that was coming in. He is not sure who this was. He tells me however that the wound seems  to have gotten worse even compared to where it [facility], Chad [name] ([id number]) [id number] Page 10 of 12 was when we last saw him at this point. With that being said I do believe that he is likely going need ongoing wound care here in the clinic and I do believe that we need to be the ones to frontline this since his wound does seem to be getting worse not better at this point. He voiced understanding. He is also in agreement with this plan and feels more comfortable coming here she tells me. Patient's medical history really has not changed since his prior admission he was only gone since January. 12-27-2021 upon evaluation today patient appears to be doing well with regard to his wound they did run out of the [facility] so they did not put anything on just an ABD pad with gentamicin. Still we are seeing some signs of good improvement here with some new epithelization which is great news. 01-10-2022 upon evaluation today patient appears to be doing well with regard to his wounds and he is going require some sharp debridement but overall seems to be making good progress. Fortunately I do not see any evidence of active infection locally or systemically at this time which is great news. 01-24-2022 upon evaluation today patient appears to be doing well with regard to his wound. The facility actually came and dropped him off early and he had another appointment at the hospital and then they just brought him over here and this was still hours before his appointment this afternoon. For that reason we did do our best to work him in this morning and fortunately had some space to make this happen. With that being said patient's wound does seem to be making progress here and I am very pleased in that regard I do not see any signs of active infection locally or systemically at this time. 02-07-2022 patient appears to be doing well currently in  regard to his wounds. In fact one of them the  more proximal is healed the distal is still open but seems to be doing excellent. Fortunately I do not see any evidence of active infection locally or systemically at this time which is great news. No fevers, chills, nausea, vomiting, or diarrhea. 7/27; left medial ankle venous. Improving per our intake nurse. We are using Hydrofera Blue under Tubigrip compression. They are changing that at his facility 03-06-2022 upon evaluation today patient appears to be doing well with regard to the his wound he is can require some sharp debridement but seems to be making excellent progress. Fortunately I do not see any evidence of active infection locally or systemically which is great news. 03-27-2022 upon evaluation today patient's wound is actually showing signs of excellent improvement. Fortunately I see no signs of active infection locally or systemically at this time which is great news. No fevers, chills, nausea, vomiting, or diarrhea. 04-21-2022 upon evaluation today patient appears to be doing excellent in regard to his wound in fact this is very close to resolution based on what I am seeing. I do not see any evidence of active infection locally or systemically at this time which is great news and overall I am extremely pleased with where we are today. 05-05-2022 upon evaluation today patient's wound actually appears to potentially be completely healed. Fortunately I do not see any evidence of active infection at this time which is great news and overall I am very pleased I think this needs a little time to toughen up but other than that I really do believe were doing quite well. He is very pleased to hear this its been a long time coming. 05-19-2022 upon evaluation today patient appears to be doing well currently in regard to his wound. He in fact we were hoping will be completely healed and closed but it still has a very tiny area right in the center which is still continuing to drain. Fortunately I do not  see any signs of infection locally or systemically which is great news. 11/6; this wound is close to completely" he comes in this week with some erythema at 1 edge of this which looks like something was rubbing on the area. Other than that no evidence of infection 06-16-2022 upon evaluation today patient appears to be doing well currently in regard to his ankle ulcer. This is very close to being healed but still has a small area in the central portion which is not. Fortunately there does not appear to be any signs of infection locally or systemically which is great news. No fevers, chills, nausea, vomiting, or diarrhea. 06-30-2022 upon evaluation patient's wound pretty much appears to be almost completely closed. In fact it may even be closed but there are still a lot of skin irritation around the edges of the wound and I just do not feel great about releasing them yet I would like to continue to monitor this just a little bit longer before getting to that point. He is not opposed to this and in fact is happy to continue to come in if need be in order to make sure that things are moving in the right direction he definitely does not want to backtrack. 07-14-2022 upon evaluation today patient appears to be doing well currently in regard to his wound. Has been tolerating the dressing changes without complication. Fortunately I see no evidence of active infection locally nor systemically which is great news. No fevers, chills, nausea,  vomiting, or diarrhea. 08-11-2022 upon evaluation today patient appears to be doing a little worse than last time I saw him although it has been a month. Unfortunately he was not able to be seen due to the fact that he was quarantined in the facility with COVID. Subsequently he tells me that he mention to the facility staff several times about taking it off over the past several weeks but they continue to tell him that someone have to get in touch with [place] here although nobody  ever did until Friday when the nurse manager called to have that documented in the communication notes. With that being said unfortunately this means that he had a wrap on that he was supposed to have on for only 1 week for ended up being on four 1 month essentially. I am glad that there is nothing worse than what we see currently to be perfectly honest. Objective Constitutional Well-nourished and well-hydrated in no acute distress. Vitals Time Taken: 9:52 AM, Height: 69 in, Weight: 150 lbs, BMI: 22.1, Temperature: 98.0 F, Pulse: 82 bpm, Respiratory Rate: 16 breaths/min, Blood Pressure: 113/70 mmHg. Respiratory normal breathing without difficulty. Psychiatric this patient is able to make decisions and demonstrates good insight into disease process. Alert and Oriented x 3. pleasant and cooperative. General Notes: Upon inspection patient's wound bed actually showed signs of good granulation and epithelization at this point. Fortunately there does not appear to be any signs of active infection which is great news and overall I am extremely pleased with where we stand today. No fevers, chills, nausea, vomiting, or diarrhea. Chad [name], Chad [name] ([id number]) [id number] Page 11 of 12 Integumentary (Hair, Skin) Wound #12 status is Open. Original cause of wound was Gradually Appeared. The date acquired was: 07/12/2019. The wound has been in treatment 35 weeks. The wound is located on the Left,Distal,Medial Ankle. The wound measures 2.4cm length x 1cm width x 0.2cm depth; 1.885cm^2 area and 0.377cm^3 volume. There is Fat Layer (Subcutaneous Tissue) exposed. There is a none present amount of drainage noted. The wound margin is distinct with the outline attached to the wound base. There is no granulation within the wound bed. There is no necrotic tissue within the wound bed. Wound #13 status is Open. Original cause of wound was Pressure Injury. The date acquired was: 08/11/2022.  The wound is located on the Left,Dorsal Foot. The wound measures 1cm length x 1cm width x 0.1cm depth; 0.785cm^2 area and 0.079cm^3 volume. There is Fat Layer (Subcutaneous Tissue) exposed. There is a medium amount of serosanguineous drainage noted. The wound margin is flat and intact. There is large (67-100%) red granulation within the wound bed. There is a small (1-33%) amount of necrotic tissue within the wound bed including Adherent Slough. 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 There was a Excisional Skin/Subcutaneous Tissue Debridement with a total area of 2.4 sq cm performed by Tommie Sams., PA-C. With the following instrument(s): Curette to remove Viable and Non-Viable tissue/material. Material removed includes Subcutaneous Tissue and Slough and. A time out was conducted at 10:24, prior to the start of the procedure. A Minimum  amount of bleeding was controlled with Pressure. The procedure was tolerated well. Post Debridement Measurements: 2.4cm length x 1cm width x 0.2cm depth; 0.377cm^3 volume. Character of Wound/Ulcer Post Debridement is stable. Severity of Tissue Post Debridement is: Fat layer exposed. Post procedure Diagnosis Wound #12: Same as Pre-Procedure Plan Follow-up Appointments: Return Appointment in 2 weeks. Bathing/ Shower/ Hygiene: May shower with wound dressing protected with water repellent cover or cast protector. No tub bath. Anesthetic (Use 'Patient Medications' Section for  Anesthetic Order Entry): Lidocaine applied to wound bed Edema Control - Lymphedema / Segmental Compressive Device / Other: Tubigrip single layer applied. - Tubi C single layer Elevate legs to the level of the heart and pump ankles as often as possible Elevate leg(s) parallel to the floor when sitting. Medications-Please add to medication list.: Other: - apply AandD ointment to leg, do not apply to wound WOUND #12: - Ankle Wound Laterality: Left, Medial, Distal Cleanser: Soap and Water Every Other Day/30 Days Discharge Instructions: Gently cleanse wound with antibacterial soap, rinse and pat dry prior to dressing wounds Cleanser: Wound Cleanser Every Other Day/30 Days Discharge Instructions: Wash your hands with soap and water. Remove old dressing, discard into plastic bag and place into trash. Cleanse the wound with Wound Cleanser prior to applying a clean dressing using gauze sponges, not tissues or cotton balls. Do not scrub or use excessive force. Pat dry using gauze sponges, not tissue or cotton balls. Peri-Wound Care: AandD Ointment Every Other Day/30 Days Discharge Instructions: Apply AandD Ointment to areas of redness Prim Dressing: Hydrofera Blue Ready Transfer Foam, 4x5 (in/in) Every Other Day/30 Days ary Discharge Instructions: Apply Hydrofera Blue Ready to wound bed as directed Secondary Dressing: ABD Pad 5x9 (in/in) Every Other Day/30 Days Discharge Instructions: Cover with ABD pad Secured With: Kerlix Roll Sterile or Non-Sterile 6-ply 4.5x4 (yd/yd) Every Other Day/30 Days Discharge Instructions: Apply Kerlix as directed 1. I am good recommend that we have the patient continue to monitor for any signs of infection or worsening. Based on what I am seeing I do feel like he is doing well all things considered I see no infection I do think we can need to go back to the [facility] and will go back to the Tubigrip having him change it 3 times a week as to be perfectly honest I do  not want this issue with the wrap to occur again. 2. I am also can recommend that we use an ABD pad to secure in place along with rolled gauze. 3. I would also suggest the patient continue to elevate his legs much as possible will use AandD ointment around the edges of the wound. We will see patient back for reevaluation in 1 week here in the clinic. If anything worsens or changes patient will contact our office for additional recommendations. Chad [name], VASTINE ([id number]) [id number] Page 12 of 12 Electronic Signature(s) Signed: 08/11/2022 2:24:03 PM By: Worthy Keeler PA-C Previous Signature: 08/11/2022 10:45:25 AM Version By: Worthy Keeler PA-C Entered By: Worthy Keeler on 08/11/2022 14:24:03 -------------------------------------------------------------------------------- SuperBill Details Patient Name: Date of Service: KRYSTOPHER, KUENZEL 08/11/2022 Medical Record Number: [id number] Patient Account Number: [id number] Date of Birth/Sex: Treating RN: 12/06/1960 (62 y.o. Chad [name] Primary Care Provider: [name] Other Clinician: [name] Referring Provider: Treating Provider/Extender: [name] in Treatment: 35 Diagnosis Coding ICD-10 Codes Code Description [phone] Chronic venous hypertension (idiopathic) with ulcer and inflammation of left lower extremity L97.322 Non-pressure chronic ulcer of left ankle  with fat layer exposed L98.492 Non-pressure chronic ulcer of skin of other sites with fat layer exposed 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 side Facility Procedures : CPT4 Code: [id number] Description: 38250 - DEB SUBQ TISSUE 20 SQ CM/< ICD-10 Diagnosis Description L97.322 Non-pressure chronic ulcer of left ankle with fat layer exposed Modifier: Quantity: 1 Physician Procedures : CPT4 Code Description Modifier [id number] 11042 - WC PHYS SUBQ TISS 20 SQ CM ICD-10  Diagnosis Description A19.379 Non-pressure chronic ulcer of left ankle with fat layer exposed Quantity: 1 Electronic Signature(s) Signed: 08/11/2022 10:45:40 AM By: Worthy Keeler PA-C Entered By: Worthy Keeler on 08/11/2022 10:45:40

## 2022-08-15 NOTE — Progress Notes (Signed)
Chad, [name] ([id number]) [id number] Page 1 of 10 Visit Report for 08/11/2022 Arrival Information Details Patient Name: Date of Service: Chad [name], Chad [name] 08/11/2022 10:00 A M Medical Record Number: [id number] Patient Account Number: [id number] Date of Birth/Sex: Treating RN: 01-04-1961 (62 y.o. [name], [name] Primary Care Nehemiah Montee: [name] Other Clinician: [name] Referring Alexandr Yaworski: Treating Jase Himmelberger/Extender: [name] in Treatment: 66 Visit Information History Since Last Visit All ordered tests and consults were completed: No Patient Arrived: Wheel Chair Added or deleted any medications: No Arrival Time: 09:51 Any new allergies or adverse reactions: No Transfer Assistance: EasyPivot Patient Lift Had a fall or experienced change in No Patient Identification Verified: Yes activities of daily living that may affect Secondary Verification Process Completed: Yes risk of falls: Patient Requires Transmission-Based No Signs or symptoms of abuse/neglect since last visito No Precautions: Hospitalized since last visit: No Patient Has Alerts: Yes Implantable device outside of the clinic excluding No Patient Alerts: Patient on Blood Thinner cellular tissue based products placed in the center NOT diabetic since last visit: aspirin '81mg'$  Has Dressing in Place as Prescribed: No Lives [facility] Pain Present Now: No Electronic Signature(s) Signed: 08/15/2022 9:50:53 AM By: [name] Entered By: [name] on 08/11/2022 09:52:14 -------------------------------------------------------------------------------- Clinic Level of Care Assessment Details Patient Name: Date of Service: Chad [name], Chad [name] 08/11/2022 10:00 A M Medical Record Number: [id number] Patient Account Number: [id number] Date of Birth/Sex: Treating RN: [birth year] (62 y.o. Chad [name] Primary Care Dara Camargo: [name] Other Clinician: [name] Referring Elliot Simoneaux: Treating Henya Aguallo/Extender: [name] in Treatment: 35 Clinic Level of Care Assessment Items TOOL 1 Quantity Score '[]'$  - 0 Use when EandM and Procedure is performed on INITIAL visit ASSESSMENTS - Nursing Assessment / Reassessment '[]'$  - 0 General Physical Exam (combine w/ comprehensive assessment (listed just below) when performed on new pt. evals) '[]'$  - 0 Comprehensive Assessment (HX, ROS, Risk Assessments, Wounds Hx, etc.) DORANCE, SPINK ([id number]) [phone].pdf Page 2 of 10 ASSESSMENTS - Wound and Skin Assessment / Reassessment '[]'$  - 0 Dermatologic / Skin Assessment (not related to wound area) ASSESSMENTS - Ostomy and/or Continence Assessment and Care '[]'$  - 0 Incontinence Assessment and Management '[]'$  - 0 Ostomy Care Assessment and Management (repouching, etc.) PROCESS - Coordination of Care '[]'$  - 0 Simple Patient / Family Education for ongoing care '[]'$  - 0 Complex (extensive) Patient / Family Education for ongoing care '[]'$  - 0 Staff obtains Programmer, systems, Records, T Results / Process Orders est '[]'$  - 0 Staff telephones HHA, Nursing Homes / Clarify orders / etc '[]'$  - 0 Routine Transfer to another Facility (non-emergent condition) '[]'$  - 0 Routine Hospital Admission (non-emergent condition) '[]'$  - 0 New Admissions / Biomedical engineer / Ordering NPWT Apligraf, etc. , '[]'$  - 0 Emergency Hospital Admission (emergent condition) PROCESS - Special Needs '[]'$  - 0 Pediatric / Minor Patient Management '[]'$  - 0 Isolation Patient Management '[]'$  - 0 Hearing / Language / Visual special needs '[]'$  - 0 Assessment of Community assistance (transportation, D/C planning, etc.) '[]'$  - 0 Additional assistance / Altered mentation '[]'$  - 0 Support Surface(s) Assessment (bed, cushion, seat, etc.) INTERVENTIONS - Miscellaneous '[]'$  - 0 External ear exam '[]'$  - 0 Patient Transfer (multiple staff / Civil Service fast streamer / Similar devices) '[]'$  -  0 Simple Staple / Suture removal (25 or less) '[]'$  - 0 Complex Staple / Suture removal (26 or more) '[]'$  - 0 Hypo/Hyperglycemic Management (do not check if billed separately) '[]'$  - 0 Ankle /  Brachial Index (ABI) - do not check if billed separately Has the patient been seen at the hospital within the last three years: Yes Total Score: 0 Level Of Care: ____ Electronic Signature(s) Signed: 08/15/2022 9:50:53 AM By: [name] Entered By: [name] on 08/11/2022 10:30:39 -------------------------------------------------------------------------------- Encounter Discharge Information Details Patient Name: Date of Service: Dillon Bjork [name] 08/11/2022 10:00 A M Medical Record Number: [id number] Patient Account Number: [id number] Date of Birth/Sex: Treating RN: 04/03/1961 (62 y.o. Chad [name] Primary Care Chad [name]: [name] Other Clinician: [name] Referring Chad [name]: Treating Chad [name]/Extender: [name] [place], [name] ([id number]) [id number] Page 3 of 10 Weeks in Treatment: 35 Encounter Discharge Information Items Post Procedure Vitals Discharge Condition: Stable Temperature (F): 98.0 Ambulatory Status: Wheelchair Pulse (bpm): 82 Discharge Destination: Other (Note Required) Respiratory Rate (breaths/min): 16 Telephoned: No Blood Pressure (mmHg): 113/70 Orders Sent: Yes Transportation: Other Accompanied By: self Schedule Follow-up Appointment: Yes Clinical Summary of Care: Electronic Signature(s) Signed: 08/15/2022 9:50:53 AM By: [name] Entered By: [name] on 08/11/2022 12:14:57 -------------------------------------------------------------------------------- Lower Extremity Assessment Details Patient Name: Date of Service: Chad [name], Chad [name] 08/11/2022 10:00 A M Medical Record Number: [id number] Patient Account Number: [id number] Date of Birth/Sex: Treating RN: [birth year] (62 y.o. [name], [name] Primary Care  Idriss Quackenbush: [name] Other Clinician: [name] Referring Domonic Hiscox: Treating Demario Faniel/Extender: [name] in Treatment: 35 Edema Assessment Assessed: Shirlyn Goltz: Yes] Patrice Paradise: No] Edema: [Left: Ye] [Right: s] Calf Left: Right: Point of Measurement: 33 cm From Medial Instep 34.2 cm Ankle Left: Right: Point of Measurement: 12 cm From Medial Instep 22.2 cm Vascular Assessment Pulses: Dorsalis Pedis Palpable: [Left:Yes] Electronic Signature(s) Signed: 08/11/2022 6:16:56 PM By: Gretta Cool, BSN, RN, CWS, Kim RN, BSN Signed: 08/15/2022 9:50:53 AM By: [name] Entered By: [name] on 08/11/2022 10:09:00 [name] ([id number]) [id number] Page 4 of 10 -------------------------------------------------------------------------------- Multi Wound Chart Details Patient Name: Date of Service: Chad [name], Chad [name] 08/11/2022 10:00 A M Medical Record Number: [id number] Patient Account Number: [id number] Date of Birth/Sex: Treating RN: September 02, 1960 (62 y.o. [name], [name] Primary Care Starr Urias: [name] Other Clinician: [name] Referring Maely [name]: Treating Remmington Urieta/Extender: [name] in Treatment: 35 Vital Signs Height(in): 2 Pulse(bpm): 3 Weight(lbs): 150 Blood Pressure(mmHg): 113/70 Body Mass Index(BMI): 22.1 Temperature(F): 98.0 Respiratory Rate(breaths/min): 16 [12:Photos:] [N/A:N/A] Left, Distal, Medial Ankle Left, Dorsal Foot N/A Wound Location: Gradually Appeared Pressure Injury N/A Wounding Event: Venous Leg Ulcer Pressure Ulcer N/A Primary Etiology: Anemia, Chronic Obstructive Anemia, Chronic Obstructive N/A Comorbid History: Pulmonary Disease (COPD), Coronary Pulmonary Disease (COPD), Coronary Artery Disease, Peripheral Arterial Artery Disease, Peripheral Arterial Disease, Peripheral Venous Disease, Disease, Peripheral Venous Disease, Hepatitis C, Osteoarthritis, Hepatitis C,  Osteoarthritis, Neuropathy, Received Chemotherapy Neuropathy, Received Chemotherapy 07/12/2019 08/11/2022 N/A Date Acquired: 35 0 N/A Weeks of Treatment: Open Open N/A Wound Status: No No N/A Wound Recurrence: 2.4x1x0.2 1x1x0.1 N/A Measurements L x W x D (cm) 1.885 0.785 N/A A (cm) : rea 0.377 0.079 N/A Volume (cm) : 72.70% N/A N/A % Reduction in Area: 72.70% N/A N/A % Reduction in Volume: Full Thickness Without Exposed Category/Stage I N/A Classification: Support Structures None Present Medium N/A Exudate Amount: N/A Serosanguineous N/A Exudate Type: N/A red, brown N/A Exudate Color: Distinct, outline attached Flat and Intact N/A Wound Margin: None Present (0%) N/A N/A Granulation Amount: None Present (0%) N/A N/A Necrotic Amount: Fat Layer (Subcutaneous Tissue): Yes N/A N/A Exposed Structures: Fascia: No Tendon: No Muscle: No Joint: No Bone: No Large (67-100%) None N/A Epithelialization: Treatment  Notes Electronic Signature(s) Signed: 08/15/2022 9:50:53 AM By: Kristine Garbe ([id number]) [id number] Page 5 of 10 Entered By: [name] on 08/11/2022 10:09:04 -------------------------------------------------------------------------------- Multi-Disciplinary Care Plan Details Patient Name: Date of Service: LILTON, PARE 08/11/2022 10:00 A M Medical Record Number: [id number] Patient Account Number: [id number] Date of Birth/Sex: Treating RN: July 02, 1961 (62 y.o. Chad [name] Primary Care Bo Rogue: [name] Other Clinician: [name] Referring Dontarious Schaum: Treating Gildardo Tickner/Extender: [name] in Treatment: 35 Active Inactive Venous Leg Ulcer Nursing Diagnoses: Knowledge deficit related to disease process and management Goals: Patient will maintain optimal edema control Date Initiated: 12/06/2021 Date Inactivated: 12/27/2021 Target Resolution Date: 01/03/2022 Goal Status:  Met Patient/caregiver will verbalize understanding of disease process and disease management Date Initiated: 12/06/2021 Target Resolution Date: 05/29/2022 Goal Status: Active Interventions: Assess peripheral edema status every visit. Compression as ordered Notes: Electronic Signature(s) Signed: 08/11/2022 6:16:56 PM By: Gretta Cool, BSN, RN, CWS, Kim RN, BSN Signed: 08/15/2022 9:50:53 AM By: [name] Entered By: [name] on 08/11/2022 10:48:14 -------------------------------------------------------------------------------- Pain Assessment Details Patient Name: Date of Service: Chad [name], Chad [name] 08/11/2022 10:00 A M Medical Record Number: [id number] Patient Account Number: [id number] Date of Birth/Sex: Treating RN: [birth year] (62 y.o. Chad [name] Primary Care Sheldon Sem: [name] Other Clinician: [name] Referring Ayushi Pla: Treating Cesareo Vickrey/Extender: [name] in Treatment: [address] Fl[place], [name] ([id number]) [id number] Page 6 of 10 Location of Pain Severity and Description of Pain Patient Has Paino No Site Locations Pain Management and Medication Current Pain Management: Electronic Signature(s) Signed: 08/11/2022 6:16:56 PM By: Gretta Cool, BSN, RN, CWS, Kim RN, BSN Signed: 08/15/2022 9:50:53 AM By: [name] Entered By: [name] on 08/11/2022 10:01:36 -------------------------------------------------------------------------------- Patient/Caregiver Education Details Patient Name: Date of Service: Chad [name], Chad [name] 1/15/2024andnbsp10:00 A M Medical Record Number: [id number] Patient Account Number: [id number] Date of Birth/Gender: Treating RN: 11-23-1960 (62 y.o. Chad [name] Primary Care Physician: [name] Other Clinician: [name] Referring Physician: Treating Physician/Extender: [name] in Treatment: 35 Education Assessment Education Provided  To: Patient Education Topics Provided Wound/Skin Impairment: Handouts: Other: continue wound care as directed Methods: Explain/Verbal Responses: State content correctly Electronic Signature(s) Signed: 08/15/2022 9:50:53 AM By: [name] Entered By: [name] on 08/11/2022 10:47:59 [name] ([id number]) [id number] Page 7 of 10 -------------------------------------------------------------------------------- Wound Assessment Details Patient Name: Date of Service: Chad [name], Chad [name] 08/11/2022 10:00 A M Medical Record Number: [id number] Patient Account Number: [id number] Date of Birth/Sex: Treating RN: 06-13-1961 (62 y.o. [name], [name] Primary Care Loreda Silverio: [name] Other Clinician: [name] Referring Matrice Herro: Treating Cutter Passey/Extender: [name] in Treatment: 35 Wound Status Wound Number: 12 Primary Venous Leg Ulcer Etiology: Wound Location: Left, Distal, Medial Ankle Wound Open Wounding Event: Gradually Appeared Status: Date Acquired: 07/12/2019 Comorbid Anemia, Chronic Obstructive Pulmonary Disease (COPD), Coronary Weeks Of Treatment: 35 History: Artery Disease, Peripheral Arterial Disease, Peripheral Venous Clustered Wound: No Disease, Hepatitis C, Osteoarthritis, Neuropathy, Received Chemotherapy Photos Wound Measurements Length: (cm) 2.4 Width: (cm) 1 Depth: (cm) 0.2 Area: (cm) 1.885 Volume: (cm) 0.377 % Reduction in Area: 72.7% % Reduction in Volume: 72.7% Epithelialization: Large (67-100%) Wound Description Classification: Full Thickness Without Exposed Support Structures Wound Margin: Distinct, outline attached Exudate Amount: None Present Foul Odor After Cleansing: No Slough/Fibrino No Wound Bed Granulation Amount: None Present (0%) Exposed Structure Necrotic Amount: None Present (0%) Fascia Exposed: No Fat Layer (Subcutaneous Tissue) Exposed: Yes Tendon Exposed: No Muscle  Exposed: No Joint Exposed: No Bone Exposed: No  Treatment Notes Wound #12 (Ankle) Wound Laterality: Left, Medial, Distal Cleanser Soap and Water Discharge Instruction: Gently cleanse wound with antibacterial soap, rinse and pat dry prior to dressing wounds Wound Cleanser [name] ([id number]) [phone].pdf Page 8 of 10 Discharge Instruction: Wash your hands with soap and water. Remove old dressing, discard into plastic bag and place into trash. Cleanse the wound with Wound Cleanser prior to applying a clean dressing using gauze sponges, not tissues or cotton balls. Do not scrub or use excessive force. Pat dry using gauze sponges, not tissue or cotton balls. Peri-Wound Care AandD Ointment Discharge Instruction: Apply AandD Ointment to areas of redness Topical Primary Dressing Hydrofera Blue Ready Transfer Foam, 4x5 (in/in) Discharge Instruction: Apply Hydrofera Blue Ready to wound bed as directed Secondary Dressing ABD Pad 5x9 (in/in) Discharge Instruction: Cover with ABD pad Secured With Kerlix Roll Sterile or Non-Sterile 6-ply 4.5x4 (yd/yd) Discharge Instruction: Apply Kerlix as directed Compression Wrap Compression Stockings Add-Ons Electronic Signature(s) Signed: 08/11/2022 6:16:56 PM By: Gretta Cool, BSN, RN, CWS, Kim RN, BSN Signed: 08/15/2022 9:50:53 AM By: [name] Entered By: [name] on 08/11/2022 10:08:47 -------------------------------------------------------------------------------- Wound Assessment Details Patient Name: Date of Service: Dillon Bjork [name] 08/11/2022 10:00 A M Medical Record Number: [id number] Patient Account Number: [id number] Date of Birth/Sex: Treating RN: May 21, 1961 (62 y.o. [name], [name] Primary Care Leticia Coletta: [name] Other Clinician: [name] Referring Modesto Ganoe: Treating Daneisha Surges/Extender: [name] in Treatment: 35 Wound Status Wound Number: 13 Primary Pressure  Ulcer Etiology: Wound Location: Left, Dorsal Foot Wound Open Wounding Event: Pressure Injury Status: Date Acquired: 08/11/2022 Comorbid Anemia, Chronic Obstructive Pulmonary Disease (COPD), Coronary Weeks Of Treatment: 0 History: Artery Disease, Peripheral Arterial Disease, Peripheral Venous Clustered Wound: No Disease, Hepatitis C, Osteoarthritis, Neuropathy, Received Chemotherapy Photos ORRY, SIGL ([id number]) [phone] Page 9 of 10 Wound Measurements Length: (cm) 1 Width: (cm) 1 Depth: (cm) 0.1 Area: (cm) 0.785 Volume: (cm) 0.079 % Reduction in Area: % Reduction in Volume: Epithelialization: None Wound Description Classification: Category/Stage III Wound Margin: Flat and Intact Exudate Amount: Medium Exudate Type: Serosanguineous Exudate Color: red, brown Wound Bed Granulation Amount: Large (67-100%) Exposed Structure Granulation Quality: Red Fascia Exposed: No Necrotic Amount: Small (1-33%) Fat Layer (Subcutaneous Tissue) Exposed: Yes Necrotic Quality: Adherent Slough Tendon Exposed: No Muscle Exposed: No Joint Exposed: No Bone Exposed: No Treatment Notes Wound #13 (Foot) Wound Laterality: Dorsal, Left Cleanser Peri-Wound Care Topical Primary Dressing Secondary Dressing Secured With Compression Wrap Compression Stockings Add-Ons Electronic Signature(s) Signed: 08/11/2022 10:17:17 AM By: Worthy Keeler PA-C Signed: 08/11/2022 6:16:56 PM By: Gretta Cool, BSN, RN, CWS, Kim RN, BSN Entered By: Worthy Keeler on 08/11/2022 10:17:17 [name] ([id number]) [id number] Page 10 of 10 -------------------------------------------------------------------------------- Vitals Details Patient Name: Date of Service: Chad [name], Chad [name] 08/11/2022 10:00 A M Medical Record Number: [id number] Patient Account Number: [id number] Date of Birth/Sex: Treating RN: 02/26/1961 (62 y.o. [name], [name] Primary Care Tifini Reeder:  [name] Other Clinician: [name] Referring Geral Coker: Treating Shalona Harbour/Extender: [name] in Treatment: 35 Vital Signs Time Taken: 09:52 Temperature (F): 98.0 Height (in): 69 Pulse (bpm): 82 Weight (lbs): 150 Respiratory Rate (breaths/min): 16 Body Mass Index (BMI): 22.1 Blood Pressure (mmHg): 113/70 Reference Range: 80 - 120 mg / dl Electronic Signature(s) Signed: 08/15/2022 9:50:53 AM By: [name] Entered By: [name] on 08/11/2022 10:01:31

## 2022-08-19 ENCOUNTER — Encounter: Payer: Medicaid Other | Admitting: Physician Assistant

## 2022-08-19 DIAGNOSIS — I87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 hypertension (idiopathic) with ulcer and inflammation of left lower extremity: Secondary | ICD-10-CM | POA: Diagnosis not present

## 2022-08-19 NOTE — Progress Notes (Signed)
[employer], [employer] ([id number]) [id number] Page 1 of 4 Visit Report for 08/19/2022 Chief Complaint Document Details Patient Name: Date of Service: Chad [name], Chad [name] 08/19/2022 9:15 A M Medical Record Number: [id number] Patient Account Number: [id number] Date of Birth/Sex: Treating RN: 1961/03/27 (62 y.o. [name] Primary Care Provider: [name] Other Clinician: [name] Referring Provider: Treating Provider/Extender: [name] in Treatment: 36 Information Obtained from: Patient Chief Complaint Left ankle ulcers and left foot ulcers Electronic Signature(s) Signed: 08/19/2022 9:49:06 AM By: Worthy Keeler PA-C Entered By: Worthy Keeler on 08/19/2022 09:49:06 -------------------------------------------------------------------------------- Debridement Details Patient Name: Date of Service: Chad [name], Chad [name] 08/19/2022 9:15 A M Medical Record Number: [id number] Patient Account Number: [id number] Date of Birth/Sex: Treating RN: 1961/06/18 (62 y.o. Chad [name], Chad [name] Primary Care Provider: [name] Other Clinician: [name] Referring Provider: Treating Provider/Extender: [name] in Treatment: 36 Debridement Performed for Assessment: Wound #12 Left,Distal,Medial Ankle Performed By: Physician Tommie Sams., PA-C Debridement Type: Debridement Severity of Tissue Pre Debridement: Fat layer exposed Level of Consciousness (Pre-procedure): Awake and Alert Pre-procedure Verification/Time Out Yes - 09:53 Taken: Start Time: 09:53 T Area Debrided (L x W): otal 2.2 (cm) x 0.5 (cm) = 1.1 (cm) Tissue and other material debrided: Viable, Non-Viable, Slough, Subcutaneous, Slough Level: Skin/Subcutaneous Tissue Debridement Description: Excisional Instrument: Curette Bleeding: Minimum Hemostasis Achieved: Pressure Response to Treatment: Procedure was tolerated well Level of Consciousness (Post- Awake and  Alert procedure): Chad [name], Chad [name] ([id number]) [id number] Page 2 of 4 Post Debridement Measurements of Total Wound Length: (cm) 2.2 Width: (cm) 0.5 Depth: (cm) 0.2 Volume: (cm) 0.173 Character of Wound/Ulcer Post Debridement: Stable Severity of Tissue Post Debridement: Fat layer exposed Post Procedure Diagnosis Same as Pre-procedure Electronic Signature(s) Unsigned Entered ByMassie Kluver on 08/19/2022 09:53:58 -------------------------------------------------------------------------------- Physician Orders Details Patient Name: Date of Service: Chad [name], Chad [name] 08/19/2022 9:15 A M Medical Record Number: [id number] Patient Account Number: [id number] Date of Birth/Sex: Treating RN: [birth year] (62 y.o. 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 Bathing/ Shower/ Hygiene May shower with wound dressing protected with water repellent cover or cast protector. No tub bath. Anesthetic (Use 'Patient Medications' Section for Anesthetic Order Entry) Lidocaine applied to wound bed Edema Control - Lymphedema / Segmental Compressive Device / Other Tubigrip single layer applied. - Tubi D single layer Elevate legs to the level of the heart and pump ankles as often as possible Elevate leg(s) parallel to the floor when sitting. Medications-Please add to  medication list. Other: - apply AandD ointment to leg, do not apply to wound Wound Treatment Wound #12 - Ankle Wound Laterality: Left, Medial, Distal Cleanser: Soap and Water Every Other Day/30 Days Discharge Instructions: Gently cleanse wound with antibacterial soap, rinse and pat dry prior to dressing wounds Cleanser: Wound Cleanser Every Other Day/30 Days Chad [name], Chad [name] ([id number]) [id number] Page 3 of 4 Discharge Instructions: Wash your hands with soap and water. Remove old dressing, discard into plastic bag and place into trash. Cleanse the wound with Wound Cleanser prior  to applying a clean dressing using gauze sponges, not tissues or cotton balls. Do not scrub or use excessive force. Pat dry using gauze sponges, not tissue or cotton balls. Peri-Wound Care: AandD Ointment Every Other Day/30 Days Discharge Instructions: Apply AandD Ointment to areas of redness Prim Dressing: Hydrofera Blue Ready Transfer Foam, 4x5 (in/in) Every Other Day/30 Days ary Discharge Instructions: Apply Hydrofera Blue Ready to wound bed as directed Secondary Dressing: ABD Pad 5x9 (in/in) Every Other Day/30 Days Discharge Instructions: Cover with ABD pad Secured With: Kerlix Roll Sterile or Non-Sterile 6-ply 4.5x4 (yd/yd) Every Other Day/30 Days Discharge Instructions: Apply Kerlix as directed Electronic Signature(s) Unsigned Entered By: [name] on 08/19/2022 09:55:11 -------------------------------------------------------------------------------- Problem List Details Patient Name: Date of Service: Chad [name], Chad [name] 08/19/2022 9:15 A M Medical Record Number: [id number] Patient Account Number: [id number] Date of Birth/Sex: Treating RN: 26-Jun-1961 (62 y.o. 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

## 2022-08-21 NOTE — Progress Notes (Signed)
ZAYLAN, KISSOON ([id number]) [id number] Page 1 of 10 Visit Report for 08/19/2022 Arrival Information Details Patient Name: Date of Service: Chad [name], Chad [name] 08/19/2022 9:15 A M Medical Record Number: [id number] Patient Account Number: [id number] Date of Birth/Sex: Treating RN: September 09, 1960 (62 y.o. [name], [name] Primary Care [name]: [name] Other Clinician: [name] Referring [name]: Treating [name] in Treatment: 36 Visit Information History Since Last Visit All ordered tests and consults were completed: No Patient Arrived: Wheel Chair Added or deleted any medications: No Arrival Time: 09:29 Any new allergies or adverse reactions: No Transfer Assistance: EasyPivot Patient Lift Had a fall or experienced change in No Patient Identification Verified: Yes activities of daily living that may affect Secondary Verification Process Completed: Yes risk of falls: Patient Requires Transmission-Based No Signs or symptoms of abuse/neglect since last visito No Precautions: Hospitalized since last visit: No Patient Has Alerts: Yes Implantable device outside of the clinic excluding No Patient Alerts: Patient on Blood Thinner cellular tissue based products placed in the center NOT diabetic since last visit: aspirin '81mg'$  Has Dressing in Place as Prescribed: Yes Lives [facility] Has Compression in Place as Prescribed: Yes Pain Present Now: No Electronic Signature(s) Signed: 08/19/2022 4:31:21 PM By: [name] Entered By: [name] on 08/19/2022 09:32:15 -------------------------------------------------------------------------------- Clinic Level of Care Assessment Details Patient Name: Date of Service: Chad [name], Chad [name] 08/19/2022 9:15 A M Medical Record Number: [id number] Patient Account Number: [id number] Date of Birth/Sex: Treating RN: Feb 23, 1961 (62 y.o. [name] Primary Care  Jean Skow: [name] Other Clinician: [name] Referring [name]: Treating Joanne Salah/Extender: [name] in Treatment: 36 Clinic Level of Care Assessment Items TOOL 1 Quantity Score '[]'$  - 0 Use when EandM and Procedure is performed on INITIAL visit ASSESSMENTS - Nursing Assessment / Reassessment '[]'$  - 0 General Physical Exam (combine w/ comprehensive assessment (listed just below) when performed on new pt. 9150 Heather CirclePASQUALINO, WITHERSPOON ([id number]) [id number] Page 2 of 10 '[]'$  - 0 Comprehensive Assessment (HX, ROS, Risk Assessments, Wounds Hx, etc.) ASSESSMENTS - Wound and Skin Assessment / Reassessment '[]'$  - 0 Dermatologic / Skin Assessment (not related to wound area) ASSESSMENTS - Ostomy and/or Continence Assessment and Care '[]'$  - 0 Incontinence Assessment and Management '[]'$  - 0 Ostomy Care Assessment and Management (repouching, etc.) PROCESS - Coordination of Care '[]'$  - 0 Simple Patient / Family Education for ongoing care '[]'$  - 0 Complex (extensive) Patient / Family Education for ongoing care '[]'$  - 0 Staff obtains Programmer, systems, Records, T Results / Process Orders est '[]'$  - 0 Staff telephones HHA, Nursing Homes / Clarify orders / etc '[]'$  - 0 Routine Transfer to another Facility (non-emergent condition) '[]'$  - 0 Routine Hospital Admission (non-emergent condition) '[]'$  - 0 New Admissions / Biomedical engineer / Ordering NPWT Apligraf, etc. , '[]'$  - 0 Emergency Hospital Admission (emergent condition) PROCESS - Special Needs '[]'$  - 0 Pediatric / Minor Patient Management '[]'$  - 0 Isolation Patient Management '[]'$  - 0 Hearing / Language / Visual special needs '[]'$  - 0 Assessment of Community assistance (transportation, D/C planning, etc.) '[]'$  - 0 Additional assistance / Altered mentation '[]'$  - 0 Support Surface(s) Assessment (bed, cushion, seat, etc.) INTERVENTIONS - Miscellaneous '[]'$  - 0 External ear exam '[]'$  - 0 Patient Transfer  (multiple staff / Civil Service fast streamer / Similar devices) '[]'$  - 0 Simple Staple / Suture removal (25 or less) '[]'$  - 0 Complex Staple / Suture removal (26 or more) '[]'$  - 0 Hypo/Hyperglycemic Management (do not check  if billed separately) '[]'$  - 0 Ankle / Brachial Index (ABI) - do not check if billed separately Has the patient been seen at the hospital within the last three years: Yes Total Score: 0 Level Of Care: ____ Electronic Signature(s) Signed: 08/19/2022 4:31:21 PM By: [name] Entered By: [name] on 08/19/2022 10:03:26 -------------------------------------------------------------------------------- Encounter Discharge Information Details Patient Name: Date of Service: Chad [name] 08/19/2022 9:15 A M Medical Record Number: [id number] Patient Account Number: [id number] Date of Birth/Sex: Treating RN: 01-07-1961 (62 y.o. [name] Primary Care [name]: [name] Other Clinician: [name] [facility], [name] ([id number]) [id number] Page 3 of 10 Referring [name]: Treating [name] in Treatment: 36 Encounter Discharge Information Items Post Procedure Vitals Discharge Condition: Stable Temperature (F): 98.3 Ambulatory Status: Wheelchair Pulse (bpm): 108 Discharge Destination: [place] Respiratory Rate (breaths/min): 18 Telephoned: No Blood Pressure (mmHg): 104/70 Orders Sent: Yes Transportation: Other Accompanied By: self Schedule Follow-up Appointment: Yes Clinical Summary of Care: Electronic Signature(s) Signed: 08/19/2022 4:31:21 PM By: [name] Entered By: [name] on 08/19/2022 10:11:34 -------------------------------------------------------------------------------- Lower Extremity Assessment Details Patient Name: Date of Service: Chad [name], Chad [name] 08/19/2022 9:15 A M Medical Record Number: [id number] Patient Account Number: [id number] Date of Birth/Sex:  Treating RN: Jul 21, 1961 (62 y.o. [name], [name] Primary Care Takeila Thayne: [name] Other Clinician: [name] Referring Latrelle Fuston: Treating Saki Legore/Extender: [name] in Treatment: 36 Edema Assessment Assessed: Shirlyn Goltz: Yes] Patrice Paradise: No] Edema: [Left: Ye] [Right: s] Calf Left: Right: Point of Measurement: 33 cm From Medial Instep 32.5 cm Ankle Left: Right: Point of Measurement: 12 cm From Medial Instep 22.5 cm Vascular Assessment Pulses: Dorsalis Pedis Palpable: [Left:Yes] Electronic Signature(s) Signed: 08/19/2022 4:31:21 PM By: [name] Signed: 08/20/2022 5:47:34 PM By: Gretta Cool, BSN, RN, CWS, Kim RN, BSN Entered By: [name] on 08/19/2022 09:45:27 [name] ([id number]) [id number] Page 4 of 10 -------------------------------------------------------------------------------- Multi Wound Chart Details Patient Name: Date of Service: Chad [name], Chad [name] 08/19/2022 9:15 A M Medical Record Number: [id number] Patient Account Number: [id number] Date of Birth/Sex: Treating RN: 1961/03/17 (62 y.o. [name], [name] Primary Care Bridgitte Felicetti: [name] Other Clinician: [name] Referring Mikya Don: Treating Lurie Mullane/Extender: [name] in Treatment: 36 Vital Signs Height(in): 46 Pulse(bpm): 108 Weight(lbs): 150 Blood Pressure(mmHg): 104/70 Body Mass Index(BMI): 22.1 Temperature(F): 98.3 Respiratory Rate(breaths/min): 18 [12:Photos:] [N/A:N/A] Left, Distal, Medial Ankle Left, Dorsal Foot N/A Wound Location: Gradually Appeared Pressure Injury N/A Wounding Event: Venous Leg Ulcer Pressure Ulcer N/A Primary Etiology: Anemia, Chronic Obstructive Anemia, Chronic Obstructive N/A Comorbid History: Pulmonary Disease (COPD), Coronary Pulmonary Disease (COPD), Coronary Artery Disease, Peripheral Arterial Artery Disease, Peripheral Arterial Disease, Peripheral Venous Disease, Disease, Peripheral  Venous Disease, Hepatitis C, Osteoarthritis, Hepatitis C, Osteoarthritis, Neuropathy, Received Chemotherapy Neuropathy, Received Chemotherapy 07/12/2019 08/11/2022 N/A Date Acquired: 36 1 N/A Weeks of Treatment: Open Open N/A Wound Status: No No N/A Wound Recurrence: 2.2x0.5x0.2 0.4x0.4x0.1 N/A Measurements L x W x D (cm) 0.864 0.126 N/A A (cm) : rea 0.173 0.013 N/A Volume (cm) : 87.50% 83.90% N/A % Reduction in Area: 87.50% 83.50% N/A % Reduction in Volume: Full Thickness Without Exposed Category/Stage III N/A Classification: Support Structures None Present Medium N/A Exudate Amount: N/A Serosanguineous N/A Exudate Type: N/A red, brown N/A Exudate Color: Distinct, outline attached Flat and Intact N/A Wound Margin: None Present (0%) Large (67-100%) N/A Granulation Amount: N/A Red N/A Granulation Quality: None Present (0%) Small (1-33%) N/A Necrotic Amount: Fat Layer (Subcutaneous Tissue): Yes Fat Layer (Subcutaneous Tissue): Yes  N/A Exposed Structures: Fascia: No Fascia: No Tendon: No Tendon: No Muscle: No Muscle: No Joint: No Joint: No Bone: No Bone: No Large (67-100%) None N/A Epithelialization: Treatment Notes Electronic Signature(s) Signed: 08/19/2022 4:31:21 PM By: Kristine Garbe ([id number])XB ByRudean Hitt.pdf Page 5 of 10 Signed: 08/19/2022 4:31:21 Entered By: [name] on 08/19/2022 09:45:36 -------------------------------------------------------------------------------- Multi-Disciplinary Care Plan Details Patient Name: Date of Service: Chad [name], Chad [name] 08/19/2022 9:15 A M Medical Record Number: [id number] Patient Account Number: [id number] Date of Birth/Sex: Treating RN: [birth year] (62 y.o. [name] Primary Care Brittnei Jagiello: [name] Other Clinician: [name] Referring Javares Kaufhold: Treating Noemi Bellissimo/Extender: [name] in Treatment: 36 Active  Inactive Venous Leg Ulcer Nursing Diagnoses: Knowledge deficit related to disease process and management Goals: Patient will maintain optimal edema control Date Initiated: 12/06/2021 Date Inactivated: 12/27/2021 Target Resolution Date: 01/03/2022 Goal Status: Met Patient/caregiver will verbalize understanding of disease process and disease management Date Initiated: 12/06/2021 Target Resolution Date: 05/29/2022 Goal Status: Active Interventions: Assess peripheral edema status every visit. Compression as ordered Notes: Electronic Signature(s) Signed: 08/19/2022 4:31:21 PM By: [name] Signed: 08/20/2022 5:47:34 PM By: Gretta Cool, BSN, RN, CWS, Kim RN, BSN Entered By: [name] on 08/19/2022 10:10:34 -------------------------------------------------------------------------------- Pain Assessment Details Patient Name: Date of Service: Chad [name], Chad [name] 08/19/2022 9:15 A M Medical Record Number: [id number] Patient Account Number: [id number] Date of Birth/Sex: Treating RN: Dec 10, 1960 (62 y.o. [name] Primary Care Ellena Kamen: [name] Other Clinician: [name] Referring Onica Davidovich: Treating Mattheu Brodersen/Extender: [name] in Treatment: [address], [place] ([id number]) [id number] Page 6 of 10 Active Problems Location of Pain Severity and Description of Pain Patient Has Paino No Site Locations Pain Management and Medication Current Pain Management: Electronic Signature(s) Signed: 08/19/2022 4:31:21 PM By: [name] Signed: 08/20/2022 5:47:34 PM By: Gretta Cool, BSN, RN, CWS, Kim RN, BSN Entered By: [name] on 08/19/2022 09:34:56 -------------------------------------------------------------------------------- Patient/Caregiver Education Details Patient Name: Date of Service: Chad [name] 1/23/2024andnbsp9:15 A M Medical Record Number: [id number] Patient Account Number: [id number] Date of Birth/Gender: Treating  RN: 12/05/1960 (62 y.o. [name] Primary Care Physician: [name] Other Clinician: [name] Referring Physician: Treating Physician/Extender: [name] in Treatment: 36 Education Assessment Education Provided To: Patient Education Topics Provided Wound/Skin Impairment: Handouts: Other: continue wound care as directed Methods: Explain/Verbal Responses: State content correctly Electronic Signature(s) Signed: 08/19/2022 4:31:21 PM By: [name] Entered By: [name] on 08/19/2022 10:10:29 [name] ([id number]) [id number] Page 7 of 10 -------------------------------------------------------------------------------- Wound Assessment Details Patient Name: Date of Service: Chad [name], Chad [name] 08/19/2022 9:15 A M Medical Record Number: [id number] Patient Account Number: [id number] Date of Birth/Sex: Treating RN: [birth year] (62 y.o. [name], [name] Primary Care Dacota Ruben: [name] Other Clinician: [name] Referring Winifred Balogh: Treating Jaasiel Hollyfield/Extender: [name] in Treatment: 36 Wound Status Wound Number: 12 Primary Venous Leg Ulcer Etiology: Wound Location: Left, Distal, Medial Ankle Wound Open Wounding Event: Gradually Appeared Status: Date Acquired: 07/12/2019 Comorbid Anemia, Chronic Obstructive Pulmonary Disease (COPD), Coronary Weeks Of Treatment: 36 History: Artery Disease, Peripheral Arterial Disease, Peripheral Venous Clustered Wound: No Disease, Hepatitis C, Osteoarthritis, Neuropathy, Received Chemotherapy Photos Wound Measurements Length: (cm) 2.2 Width: (cm) 0.5 Depth: (cm) 0.2 Area: (cm) 0.864 Volume: (cm) 0.173 % Reduction in Area: 87.5% % Reduction in Volume: 87.5% Epithelialization: Large (67-100%) Wound Description Classification: Full Thickness Without Exposed Support Structures Wound Margin: Distinct, outline attached Exudate Amount: None  Present Foul Odor After Cleansing: No Slough/Fibrino No  Wound Bed Granulation Amount: None Present (0%) Exposed Structure Necrotic Amount: None Present (0%) Fascia Exposed: No Fat Layer (Subcutaneous Tissue) Exposed: Yes Tendon Exposed: No Muscle Exposed: No Joint Exposed: No Bone Exposed: No Treatment Notes Wound #12 (Ankle) Wound Laterality: Left, Medial, Distal Cleanser Soap and Water Discharge Instruction: Gently cleanse wound with antibacterial soap, rinse and pat dry prior to dressing wounds Wound Cleanser [name] ([id number]) [id number] Page 8 of 10 Discharge Instruction: Wash your hands with soap and water. Remove old dressing, discard into plastic bag and place into trash. Cleanse the wound with Wound Cleanser prior to applying a clean dressing using gauze sponges, not tissues or cotton balls. Do not scrub or use excessive force. Pat dry using gauze sponges, not tissue or cotton balls. Peri-Wound Care AandD Ointment Discharge Instruction: Apply AandD Ointment to areas of redness Topical Primary Dressing Hydrofera Blue Ready Transfer Foam, 4x5 (in/in) Discharge Instruction: Apply Hydrofera Blue Ready to wound bed as directed Secondary Dressing ABD Pad 5x9 (in/in) Discharge Instruction: Cover with ABD pad Secured With Kerlix Roll Sterile or Non-Sterile 6-ply 4.5x4 (yd/yd) Discharge Instruction: Apply Kerlix as directed Compression Wrap Compression Stockings Add-Ons Electronic Signature(s) Signed: 08/19/2022 4:31:21 PM By: [name] Signed: 08/20/2022 5:47:34 PM By: Gretta Cool, BSN, RN, CWS, Kim RN, BSN Entered By: [name] on 08/19/2022 09:43:39 -------------------------------------------------------------------------------- Wound Assessment Details Patient Name: Date of Service: Chad [name], Chad [name] 08/19/2022 9:15 A M Medical Record Number: [id number] Patient Account Number: [id number] Date of Birth/Sex: Treating RN: 16-Mar-1961  (62 y.o. [name], [name] Primary Care Sephiroth Mcluckie: [name] Other Clinician: [name] Referring Tamsin Nader: Treating Aadhav Uhlig/Extender: [name] in Treatment: 36 Wound Status Wound Number: 13 Primary Pressure Ulcer Etiology: Wound Location: Left, Dorsal Foot Wound Open Wounding Event: Pressure Injury Status: Date Acquired: 08/11/2022 Comorbid Anemia, Chronic Obstructive Pulmonary Disease (COPD), Coronary Weeks Of Treatment: 1 History: Artery Disease, Peripheral Arterial Disease, Peripheral Venous Clustered Wound: No Disease, Hepatitis C, Osteoarthritis, Neuropathy, Received Chemotherapy Photos ISAIHA, ASARE ([id number]) [phone].pdf Page 9 of 10 Wound Measurements Length: (cm) 0.4 Width: (cm) 0.4 Depth: (cm) 0.1 Area: (cm) 0.126 Volume: (cm) 0.013 % Reduction in Area: 83.9% % Reduction in Volume: 83.5% Epithelialization: None Wound Description Classification: Category/Stage III Wound Margin: Flat and Intact Exudate Amount: Medium Exudate Type: Serosanguineous Exudate Color: red, brown Wound Bed Granulation Amount: Large (67-100%) Exposed Structure Granulation Quality: Red Fascia Exposed: No Necrotic Amount: Small (1-33%) Fat Layer (Subcutaneous Tissue) Exposed: Yes Necrotic Quality: Adherent Slough Tendon Exposed: No Muscle Exposed: No Joint Exposed: No Bone Exposed: No Treatment Notes Wound #13 (Foot) Wound Laterality: Dorsal, Left Cleanser Peri-Wound Care Topical Primary Dressing Secondary Dressing Secured With Compression Wrap Compression Stockings Add-Ons Electronic Signature(s) Signed: 08/19/2022 4:31:21 PM By: [name] Signed: 08/20/2022 5:47:34 PM By: Gretta Cool, BSN, RN, CWS, Kim RN, BSN Entered By: [name] on 08/19/2022 09:44:05 [name] ([id number]) [id number] Page 10 of  10 -------------------------------------------------------------------------------- Vitals Details Patient Name: Date of Service: Chad [name], Chad [name] 08/19/2022 9:15 A M Medical Record Number: [id number] Patient Account Number: [id number] Date of Birth/Sex: Treating RN: 07/03/1961 (62 y.o. [name], [name] Primary Care Jebediah Macrae: [name] Other Clinician: [name] Referring Jesscia Imm: Treating Ubah Radke/Extender: [name] in Treatment: 36 Vital Signs Time Taken: 09:32 Temperature (F): 98.3 Height (in): 69 Pulse (bpm): 108 Weight (lbs): 150 Respiratory Rate (breaths/min): 18 Body Mass Index (BMI): 22.1 Blood Pressure (mmHg): 104/70 Reference Range: 80 - 120 mg / dl Electronic Signature(s) Signed: 08/19/2022 4:31:21 PM By: [name] Entered  ByMassie Kluver on 08/19/2022 09:34:45

## 2022-08-25 ENCOUNTER — Encounter: Payer: Self-pay | Admitting: Internal Medicine

## 2022-08-25 ENCOUNTER — Inpatient Hospital Stay: Payer: Medicaid Other

## 2022-08-25 ENCOUNTER — Inpatient Hospital Stay: Payer: Medicaid Other | Attending: Internal Medicine | Admitting: Internal Medicine

## 2022-08-25 VITALS — BP 121/76 | HR 90 | Temp 97.3°F | Resp 18 | Ht 69.0 in | Wt 168.2 lb

## 2022-08-25 DIAGNOSIS — K5909 Other constipation: Secondary | ICD-10-CM | POA: Insufficient documentation

## 2022-08-25 DIAGNOSIS — F17210 Nicotine dependence, cigarettes, uncomplicated: Secondary | ICD-10-CM | POA: Diagnosis not present

## 2022-08-25 DIAGNOSIS — C661 Malignant neoplasm of right ureter: Secondary | ICD-10-CM | POA: Diagnosis present

## 2022-08-25 DIAGNOSIS — D509 Iron deficiency anemia, unspecified: Secondary | ICD-10-CM | POA: Diagnosis not present

## 2022-08-25 DIAGNOSIS — C187 Malignant neoplasm of sigmoid colon: Secondary | ICD-10-CM | POA: Diagnosis not present

## 2022-08-25 DIAGNOSIS — Z7189 Other specified counseling: Secondary | ICD-10-CM

## 2022-08-25 DIAGNOSIS — Z515 Encounter for palliative care: Secondary | ICD-10-CM

## 2022-08-25 DIAGNOSIS — D500 Iron deficiency anemia secondary to blood loss (chronic): Secondary | ICD-10-CM | POA: Diagnosis not present

## 2022-08-25 DIAGNOSIS — Z79899 Other long term (current) drug therapy: Secondary | ICD-10-CM | POA: Diagnosis not present

## 2022-08-25 DIAGNOSIS — Z5112 Encounter for antineoplastic immunotherapy: Secondary | ICD-10-CM | POA: Diagnosis present

## 2022-08-25 LAB — COMPREHENSIVE METABOLIC PANEL
ALT: 23 U/L (ref 0–44)
AST: 32 U/L (ref 15–41)
Albumin: 3.4 g/dL — ABNORMAL LOW (ref 3.5–5.0)
Alkaline Phosphatase: 130 U/L — ABNORMAL HIGH (ref 38–126)
Anion gap: 9 (ref 5–15)
BUN: 21 mg/dL (ref 8–23)
CO2: 24 mmol/L (ref 22–32)
Calcium: 8.4 mg/dL — ABNORMAL LOW (ref 8.9–10.3)
Chloride: 103 mmol/L (ref 98–111)
Creatinine, Ser: 1.03 mg/dL (ref 0.61–1.24)
GFR, Estimated: >60 mL/min (ref >60)
Glucose, Bld: 88 mg/dL (ref 70–99)
Potassium: 4.1 mmol/L (ref 3.5–5.1)
Sodium: 136 mmol/L (ref 135–145)
Total Bilirubin: 0.3 mg/dL (ref 0.3–1.2)
Total Protein: 7.5 g/dL (ref 6.5–8.1)

## 2022-08-25 LAB — CBC WITH DIFFERENTIAL/PLATELET
Abs Immature Granulocytes: 0.02 10*3/uL (ref 0.00–0.07)
Basophils Absolute: 0.1 10*3/uL (ref 0.0–0.1)
Basophils Relative: 1 %
Eosinophils Absolute: 0.4 10*3/uL (ref 0.0–0.5)
Eosinophils Relative: 6 %
HCT: 40.6 % (ref 39.0–52.0)
Hemoglobin: 13.1 g/dL (ref 13.0–17.0)
Immature Granulocytes: 0 %
Lymphocytes Relative: 25 %
Lymphs Abs: 1.5 10*3/uL (ref 0.7–4.0)
MCH: 26.1 pg (ref 26.0–34.0)
MCHC: 32.3 g/dL (ref 30.0–36.0)
MCV: 81 fL (ref 80.0–100.0)
Monocytes Absolute: 0.6 10*3/uL (ref 0.1–1.0)
Monocytes Relative: 10 %
Neutro Abs: 3.4 10*3/uL (ref 1.7–7.7)
Neutrophils Relative %: 58 %
Platelets: 156 10*3/uL (ref 150–400)
RBC: 5.01 MIL/uL (ref 4.22–5.81)
RDW: 17.2 % — ABNORMAL HIGH (ref 11.5–15.5)
WBC: 5.9 10*3/uL (ref 4.0–10.5)
nRBC: 0 % (ref 0.0–0.2)

## 2022-08-25 LAB — TSH: TSH: 2.487 u[IU]/mL (ref 0.350–4.500)

## 2022-08-25 MED ORDER — SODIUM CHLORIDE 0.9 % IV SOLN
200.0000 mg | Freq: Once | INTRAVENOUS | Status: AC
  Administered 2022-08-25: 200 mg via INTRAVENOUS
  Filled 2022-08-25: qty 8

## 2022-08-25 MED ORDER — SODIUM CHLORIDE 0.9 % IV SOLN
Freq: Once | INTRAVENOUS | Status: AC
  Filled 2022-08-25: qty 250

## 2022-08-25 MED ORDER — HEPARIN SOD (PORK) LOCK FLUSH 100 UNIT/ML IV SOLN
500.0000 [IU] | Freq: Once | INTRAVENOUS | Status: AC | PRN
  Administered 2022-08-25: 500 [IU]
  Filled 2022-08-25: qty 5

## 2022-08-25 NOTE — Progress Notes (Signed)
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  Is alone/in a wheelchair.  Chad [name] 62 y.o.  male above history of stage IV-ureteral cancer/history of stage III colon cancer right side; recurrent sigmoid colon cancer [un-resected/under surveillance] and multiple other comorbidities currently on Keytruda is here for follow-up.  In the interim patient was diagnosed with COVID infection at the nursing home.  Patient did not seek any hospitalizations.  Worsening chronic constipation.   Last non complete bowel movement yesterday.    Wound on left heel not healing well and being seen by wound clinic with next visit tomorrow.     Active medication list provided by [facility] does not have Levothyroxine listed.   Denies any worsening abdominal pain.  No blood in stools or black-colored stools.  No hematuria.  Chronic shortness of breath.  Not any worse.  Review of Systems  Constitutional:  Positive for malaise/fatigue. Negative for chills, diaphoresis, fever and weight loss.  HENT:  Negative for nosebleeds and sore throat.   Eyes:  Negative for double vision.  Respiratory:  Positive for cough and shortness of breath. Negative for hemoptysis and wheezing.   Cardiovascular:  Negative for chest pain, palpitations and orthopnea.  Gastrointestinal:  Positive for constipation. Negative for abdominal pain, blood in stool, diarrhea, heartburn, melena, nausea and vomiting.  Genitourinary:  Negative for dysuria, frequency and urgency.  Musculoskeletal:  Positive for back pain and joint pain.  Skin: Negative.  Negative for itching and rash.  Neurological:  Positive for focal weakness. Negative for dizziness, tingling, weakness and headaches.       Chronic left-sided weakness upper than lower extremity.  Endo/Heme/Allergies:  Does not bruise/bleed easily.  Psychiatric/Behavioral:  Negative for depression. The patient is not nervous/anxious and does not have insomnia.      MEDICAL HISTORY:  Past Medical History:  Diagnosis Date   Anemia    Anxiety    ARF (acute respiratory failure) (HCC)    Bladder cancer (HCC)    COPD (chronic obstructive pulmonary disease) ([place])    Depression    Dysphagia    Family history of colon cancer  Family history of kidney cancer    Family history of leukemia    Family history of prostate cancer    GERD (gastroesophageal reflux disease)    Hepatitis    chronic hep c   Hydronephrosis    Hydronephrosis with ureteral stricture     Hyperlipidemia    Knee pain    Left   Malignant neoplasm of colon (HCC)    Nerve pain    Peripheral vascular disease (HCC)    Prostate cancer ([place])    Stroke ([place])    Ureteral cancer, right ([place])    Urinary frequency    Venous hypertension of both lower extremities     SURGICAL HISTORY: Past Surgical History:  Procedure Laterality Date   COLON SURGERY     En bloc extended right hemicolectomy 07/2017   COLONOSCOPY WITH PROPOFOL N/A 11/06/2020   Procedure: COLONOSCOPY WITH PROPOFOL;  Surgeon: Jonathon Bellows, MD;  Location[facility] ENDOSCOPY;  Service: Gastroenterology;  Laterality: N/A;   COLONOSCOPY WITH PROPOFOL N/A 07/31/2021   Procedure: COLONOSCOPY WITH PROPOFOL;  Surgeon: Seven Bellow, MD;  Location: [facility] ENDOSCOPY;  Service: Endoscopy;  Laterality: N/A;   CYSTOSCOPY W/ RETROGRADES Right 08/30/2018   Procedure: CYSTOSCOPY WITH RETROGRADE PYELOGRAM;  Surgeon: Hollice Espy, MD;  Location: [facility] ORS;  Service: Urology;  Laterality: Right;   CYSTOSCOPY WITH STENT PLACEMENT Right 04/25/2018   Procedure: CYSTOSCOPY WITH STENT PLACEMENT;  Surgeon: Hollice Espy, MD;  Location: [facility] ORS;  Service: Urology;  Laterality: Right;   CYSTOSCOPY WITH STENT PLACEMENT Right 08/30/2018   Procedure: [place];  Surgeon: Hollice Espy, MD;  Location: [facility] ORS;  Service: Urology;  Laterality: Right;   CYSTOSCOPY WITH STENT PLACEMENT Right 03/07/2019   Procedure: CYSTOSCOPY WITH STENT Exchange;  Surgeon: Hollice Espy, MD;  Location: [facility] ORS;  Service: Urology;  Laterality: Right;   CYSTOSCOPY WITH STENT PLACEMENT Right 11/21/2019   Procedure: CYSTOSCOPY WITH STENT Exchange;  Surgeon: Hollice Espy, MD;  Location: [facility] ORS;  Service: Urology;  Laterality: Right;   LOWER EXTREMITY ANGIOGRAPHY Left 05/23/2019   Procedure: LOWER EXTREMITY ANGIOGRAPHY;  Surgeon: Algernon Huxley, MD;  Location: [place] CV LAB;  Service: Cardiovascular;  Laterality: Left;   LOWER EXTREMITY  ANGIOGRAPHY Right 05/30/2019   Procedure: LOWER EXTREMITY ANGIOGRAPHY;  Surgeon: Algernon Huxley, MD;  Location: [place] CV LAB;  Service: Cardiovascular;  Laterality: Right;   LOWER EXTREMITY ANGIOGRAPHY Right 02/13/2020   Procedure: LOWER EXTREMITY ANGIOGRAPHY;  Surgeon: Algernon Huxley, MD;  Location: [place] CV LAB;  Service: Cardiovascular;  Laterality: Right;   LOWER EXTREMITY ANGIOGRAPHY Left 02/20/2020   Procedure: LOWER EXTREMITY ANGIOGRAPHY;  Surgeon: Algernon Huxley, MD;  Location: [place] CV LAB;  Service: Cardiovascular;  Laterality: Left;   PORTA CATH INSERTION N/A 02/28/2019   Procedure: PORTA CATH INSERTION;  Surgeon: Algernon Huxley, MD;  Location: [place] CV LAB;  Service: Cardiovascular;  Laterality: N/A;   tumor removed       SOCIAL HISTORY: Social History   Socioeconomic History   Marital status: Single    Spouse name: Not on file   Number of children: Not on file   Years of education: Not on file   Highest education level: Not on file  Occupational History   Not on file  Tobacco Use   Smoking status: Every Day    Packs/day: 1.00    Types: Cigarettes   Smokeless tobacco: Never  Vaping Use   Vaping Use: Never used  Substance and  Sexual Activity   Alcohol use: Not Currently   Drug use: Not Currently   Sexual activity: Not Currently  Other Topics Concern   Not on file  Social History Narrative    used to live [place]; moved  To [place]- end of April 2019; in Nursing home; 1pp/day; quit alcohol. 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        Family- dad- prostate ca [at 75y]; brother- 81 died of prostate cancer; brother- 88- no cancers [[place]]; sonGerald Stabs [[place]];[name]-32y prostate ca [facility] [place]]; daughter- 60 [[place]]; another daughter 61 [[place]/addict]. will refer genetics counseling. Given MSI- abnormal; highly suspicious of Lynch syndrome.  Patient's son [name] aware of high possible lynch syndrome.   Social Determinants of Health   Financial Resource Strain:  Not on file  Food Insecurity: Not on file  Transportation Needs: Not on file  Physical Activity: Not on file  Stress: Not on file  Social Connections: Not on file  Intimate Partner Violence: Not on file    FAMILY HISTORY: Family History  Problem Relation Age of Onset   Prostate cancer Father 49   Cancer Brother 97       unsure type   Cancer Paternal Uncle        unsure type   Cancer Maternal Grandmother        unsure type   Cancer Paternal Grandmother        unsure type   Kidney cancer Paternal Grandfather    Cancer Other        unsure types   Leukemia Son    Cancer Son        other cancers, possibly colon    ALLERGIES:  is allergic to penicillins.  MEDICATIONS:  Current Outpatient Medications  Medication Sig Dispense Refill   acetaminophen (TYLENOL) 325 MG tablet Take 650 mg by mouth 3 (three) times daily.      aspirin EC 81 MG tablet Take 1 tablet (81 mg total) by mouth daily. 150 tablet 2   atorvastatin (LIPITOR) 10 MG tablet Take 1 tablet (10 mg total) by mouth daily. 30 tablet 11   Calcium Carbonate Antacid 600 MG chewable tablet Chew 2 tablets (1,200 mg total) by mouth daily. 90 tablet 3   carboxymethylcellulose 1 % ophthalmic solution Apply 1 drop to eye 2 (two) times daily.     clopidogrel (PLAVIX) 75 MG tablet Take 75 mg by mouth daily.     cyclobenzaprine (FLEXERIL) 10 MG tablet Take 10 mg by mouth at bedtime.     escitalopram (LEXAPRO) 5 MG tablet Take 5 mg by mouth daily.     gabapentin (NEURONTIN) 100 MG capsule Take 100 mg by mouth 3 (three) times daily.     gentamicin ointment (GARAMYCIN) 0.1 % Apply 1 application. topically 3 (three) times daily.     mirtazapine (REMERON) 7.5 MG tablet Take 7.5 mg by mouth at bedtime.     oxycodone (OXY-IR) 5 MG capsule Take 5 mg by mouth every 6 (six) hours.     senna-docusate (SENOKOT-S) 8.6-50 MG tablet Take 1 tablet by mouth daily.     tamsulosin (FLOMAX) 0.4 MG CAPS capsule Take 1 capsule (0.4 mg total) by mouth  daily after supper. 30 capsule 3   cholecalciferol (VITAMIN D3) 25 MCG (1000 UNIT) tablet Take 1 tablet (1,000 Units total) by mouth daily. (Patient not taking: Reported on 07/01/2022) 1 tablet 3   levothyroxine (SYNTHROID) 25 MCG tablet Take 1 tablet (25 mcg total) by mouth daily  before breakfast. (Patient not taking: Reported on 07/01/2022) 30 tablet 1   Oxycodone HCl 10 MG TABS Take 5 mg by mouth every 6 (six) hours as needed for pain. (Patient not taking: Reported on 08/25/2022)     pantoprazole (PROTONIX) 40 MG tablet Take 40 mg by mouth daily. (Patient not taking: Reported on 07/01/2022)     No current facility-administered medications for this visit.   Facility-Administered Medications Ordered in Other Visits  Medication Dose Route Frequency Provider Last Rate Last Admin   0.9 %  sodium chloride infusion   Intravenous Once Charlaine Dalton R, MD       heparin lock flush 100 unit/mL  500 Units Intracatheter Once PRN Cammie Sickle, MD       pembrolizumab [facility]) 200 mg in sodium chloride 0.9 % 50 mL chemo infusion  200 mg Intravenous Once Cammie Sickle, MD          .  PHYSICAL EXAMINATION: ECOG PERFORMANCE STATUS: 1 - Symptomatic but completely ambulatory  Vitals:   08/25/22 1400  BP: 121/76  Pulse: 90  Resp: 18  Temp: (!) 97.3 F (36.3 C)    Filed Weights   08/25/22 1400  Weight: 168 lb 3.2 oz (76.3 kg)        Physical Exam HENT:     Head: Normocephalic and atraumatic.     Mouth/Throat:     Pharynx: No oropharyngeal exudate.  Eyes:     Pupils: Pupils are equal, round, and reactive to light.  Cardiovascular:     Rate and Rhythm: Normal rate and regular rhythm.  Pulmonary:     Effort: No respiratory distress.     Breath sounds: No wheezing.  Abdominal:     General: Bowel sounds are normal. There is no distension.     Palpations: Abdomen is soft. There is no mass.     Tenderness: There is no abdominal tenderness. There is no guarding or  rebound.  Musculoskeletal:        General: No tenderness. Normal range of motion.     Cervical back: Normal range of motion and neck supple.  Skin:    General: Skin is warm.     Comments: Bilateral lower extremity ulcerations noted.  Pulses intact bilaterally.  Cystlike lesion noted right elbow-nontender.  No erythema noted.  Neurological:     Mental Status: He is oriented to person, place, and time.     Comments: Sleepy but easily arousable.  Chronic weakness left upper and lower extremity.  Psychiatric:        Mood and Affect: Affect normal.      LABORATORY DATA:  I have reviewed the data as listed Lab Results  Component Value Date   WBC 5.9 08/25/2022   HGB 13.1 08/25/2022   HCT 40.6 08/25/2022   MCV 81.0 08/25/2022   PLT 156 08/25/2022   Recent Labs    06/10/22 1002 07/01/22 0855 08/25/22 1409  NA 135 136 136  K 3.7 4.3 4.1  CL 105 103 103  CO2 '24 26 24  '$ GLUCOSE 159* 86 88  BUN '18 20 21  '$ CREATININE 1.02 1.11 1.03  CALCIUM 8.3* 8.2* 8.4*  GFRNONAA >60 >60 >60  PROT 7.2 7.2 7.5  ALBUMIN 3.2* 3.3* 3.4*  AST 44* 41 32  ALT '25 26 23  '$ ALKPHOS 127* 133* 130*  BILITOT 0.4 0.5 0.3    ASSESSMENT & PLAN:   Ureteral cancer, right (HCC) # High-grade urothelial cancer/cytology; likely of the right renal pelvis /  upper ureter. On keytruda-SEP, 22nd 2023-radiologically mild progressive disease noted in the proximal right ureter; however overall clinically stable. See below-regarding sigmoid cancer- will order CT scan today. Stable.   # Proceed with  [place] today; Labs today reviewed; Labs today reviewed;  acceptable for treatment today. Stable.  TSH dec 2023--WNL.   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 SEP 22nd, 2023 CT scan- Focal wall thickening in the proximal sigmoid colon; with worsening of pericolonic lymph nodes . ; s/p evaluation with Dr. Bary Castilla- over all  clinically stable.  Will order CT scan.  See above. stable  # COVID infection December 2023- [no hospitalization]-resolved. Stable.   # Iron deficiency anemia-hemoglobin 11-12; JULY 2023- iron studies-7/ferritin-18 LOW; s/p  venofer-hemoglobin 13.2 Stable.   # Hypocalcemia- Mild- on Ca+vit D; JUNE 2023- 25-OH vit D levels: 30. Stable.    #DISPOSITION: #  Keytruda today;  # follow up in 3 weeks-; MD- labs-cbc/cmp; iron studies/ferritin; vit D 25-OH;  Keytruda;venofer; CT scan prior-- -Dr.B.    All questions were answered. The patient knows to call the clinic with any problems, questions or concerns.    Cammie Sickle, MD 08/25/2022 3:20 PM

## 2022-08-25 NOTE — Progress Notes (Signed)
Worsening chronic constipation.  Last non complete bowel movement yesterday.   Wound on left heel not healing well and being seen by wound clinic with next visit tomorrow.    Active medication list provided by [facility] does not have Levothyroxine listed.

## 2022-08-25 NOTE — Assessment & Plan Note (Addendum)
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 On keytruda-SEP, 22nd 2023-radiologically mild progressive disease noted in the proximal right ureter; however overall clinically stable. See below-regarding sigmoid cancer- will order CT scan today. Stable.   # Proceed with  [place] today; Labs today reviewed; Labs today reviewed;  acceptable for treatment today. Stable.  TSH dec 2023--WNL.   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 SEP 22nd, 2023 CT scan- Focal wall thickening in the proximal sigmoid colon; with worsening of pericolonic lymph nodes . ; s/p evaluation with Dr. Bary Castilla- over all clinically stable.  Will order CT scan.  See above. stable  # COVID infection December 2023- [no hospitalization]-resolved. Stable.   # Iron deficiency anemia-hemoglobin 11-12; JULY 2023- iron studies-7/ferritin-18 LOW; s/p  venofer-hemoglobin 13.2 Stable.   # Hypocalcemia- Mild- on Ca+vit D; JUNE 2023- 25-OH vit D levels: 30. Stable.    #DISPOSITION: #  Keytruda today;  # follow up in 3 weeks-; MD- labs-cbc/cmp; iron studies/ferritin; vit D 25-OH;  Keytruda;venofer; CT scan prior-- -Dr.B.

## 2022-08-25 NOTE — Patient Instructions (Signed)
[facility]  Discharge Instructions: Thank you for choosing [facility] to provide your oncology and hematology care.  If you have a lab appointment with the [facility], please go directly to the [name] and check in at the registration area.  Wear comfortable clothing and clothing appropriate for easy access to any Portacath or PICC line.   We strive to give you quality time with your provider. You may need to reschedule your appointment if you arrive late (15 or more minutes).  Arriving late affects you and other patients whose appointments are after yours.  Also, if you miss three or more appointments without notifying the office, you may be dismissed from the clinic at the provider's discretion.      For prescription refill requests, have your pharmacy contact our office and allow 72 hours for refills to be completed.    Today you received the following chemotherapy and/or immunotherapy agents KEYTRUDA      To help prevent nausea and vomiting after your treatment, we encourage you to take your nausea medication as directed.  BELOW ARE SYMPTOMS THAT SHOULD BE REPORTED IMMEDIATELY: *FEVER GREATER THAN 100.4 F (38 C) OR HIGHER *CHILLS OR SWEATING *NAUSEA AND VOMITING THAT IS NOT CONTROLLED WITH YOUR NAUSEA MEDICATION *UNUSUAL SHORTNESS OF BREATH *UNUSUAL BRUISING OR BLEEDING *URINARY PROBLEMS (pain or burning when urinating, or frequent urination) *BOWEL PROBLEMS (unusual diarrhea, constipation, pain near the anus) TENDERNESS IN MOUTH AND THROAT WITH OR WITHOUT PRESENCE OF ULCERS (sore throat, sores in mouth, or a toothache) UNUSUAL RASH, SWELLING OR PAIN  UNUSUAL VAGINAL DISCHARGE OR ITCHING   Items with * indicate a potential emergency and should be followed up as soon as possible or go to the Emergency Department if any problems should occur.  Please show the CHEMOTHERAPY ALERT CARD or IMMUNOTHERAPY ALERT CARD at check-in to  the Emergency Department and triage nurse.  Should you have questions after your visit or need to cancel or reschedule your appointment, please contact [facility]  [phone] and follow the prompts.  Office hours are 8:00 a.m. to 4:30 p.m. Monday - Friday. Please note that voicemails left after 4:00 p.m. may not be returned until the following business day.  We are closed weekends and major holidays. You have access to a nurse at all times for urgent questions. Please call the main number to the clinic [phone] and follow the prompts.  For any non-urgent questions, you may also contact your provider using MyChart. We now offer e-Visits for anyone 62 and older to request care online for non-urgent symptoms. For details visit [email or web address].   Also download the MyChart app! Go to the app store, search "MyChart", open the app, select [facility], and log in with your MyChart username and password.  Pembrolizumab Injection What is this medication? PEMBROLIZUMAB (PEM broe LIZ ue mab) treats some types of cancer. It works by helping your immune system slow or stop the spread of cancer cells. It is a monoclonal antibody. This medicine may be used for other purposes; ask your health care provider or pharmacist if you have questions. COMMON BRAND NAME(S): Keytruda What should I tell my care team before I take this medication? They need to know if you have any of these conditions: Allogeneic stem cell transplant (uses someone else's stem cells) Autoimmune diseases, such as Crohn disease, ulcerative colitis, lupus History of chest radiation Nervous system problems, such as Guillain-Barre syndrome, myasthenia gravis  Organ transplant An unusual or allergic reaction to pembrolizumab, other medications, foods, dyes, or preservatives Pregnant or trying to get pregnant Breast-feeding How should I use this medication? This medication is injected into a vein. It  is given by your care team in a hospital or clinic setting. A special MedGuide will be given to you before each treatment. Be sure to read this information carefully each time. Talk to your care team about the use of this medication in children. While it may be prescribed for children as young as 6 months for selected conditions, precautions do apply. Overdosage: If you think you have taken too much of this medicine contact a poison control center or emergency room at once. NOTE: This medicine is only for you. Do not share this medicine with others. What if I miss a dose? Keep appointments for follow-up doses. It is important not to miss your dose. Call your care team if you are unable to keep an appointment. What may interact with this medication? Interactions have not been studied. This list may not describe all possible interactions. Give your health care provider a list of all the medicines, herbs, non-prescription drugs, or dietary supplements you use. Also tell them if you smoke, drink alcohol, or use illegal drugs. Some items may interact with your medicine. What should I watch for while using this medication? Your condition will be monitored carefully while you are receiving this medication. You may need blood work while taking this medication. This medication may cause serious skin reactions. They can happen weeks to months after starting the medication. Contact your care team right away if you notice fevers or flu-like symptoms with a rash. The rash may be red or purple and then turn into blisters or peeling of the skin. You may also notice a red rash with swelling of the face, lips, or lymph nodes in your neck or under your arms. Tell your care team right away if you have any change in your eyesight. Talk to your care team if you may be pregnant. Serious birth defects can occur if you take this medication during pregnancy and for 4 months after the last dose. You will need a negative  pregnancy test before starting this medication. Contraception is recommended while taking this medication and for 4 months after the last dose. Your care team can help you find the option that works for you. Do not breastfeed while taking this medication and for 4 months after the last dose. What side effects may I notice from receiving this medication? Side effects that you should report to your care team as soon as possible: Allergic reactions--skin rash, itching, hives, swelling of the face, lips, tongue, or throat Dry cough, shortness of breath or trouble breathing Eye pain, redness, irritation, or discharge with blurry or decreased vision Heart muscle inflammation--unusual weakness or fatigue, shortness of breath, chest pain, fast or irregular heartbeat, dizziness, swelling of the ankles, feet, or hands Hormone gland problems--headache, sensitivity to light, unusual weakness or fatigue, dizziness, fast or irregular heartbeat, increased sensitivity to cold or heat, excessive sweating, constipation, hair loss, increased thirst or amount of urine, tremors or shaking, irritability Infusion reactions--chest pain, shortness of breath or trouble breathing, feeling faint or lightheaded Kidney injury (glomerulonephritis)--decrease in the amount of urine, red or dark brown urine, foamy or bubbly urine, swelling of the ankles, hands, or feet Liver injury--right upper belly pain, loss of appetite, nausea, light-colored stool, dark yellow or brown urine, yellowing skin or eyes, unusual weakness  weakness or fatigue Pain, tingling, or numbness in the hands or feet, muscle weakness, change in vision, confusion or trouble speaking, loss of balance or coordination, trouble walking, seizures Rash, fever, and swollen lymph nodes Redness, blistering, peeling, or loosening of the skin, including inside the mouth Sudden or severe stomach pain, bloody diarrhea, fever, nausea, vomiting Side effects that usually do not require  medical attention (report to your care team if they continue or are bothersome): Bone, joint, or muscle pain Diarrhea Fatigue Loss of appetite Nausea Skin rash This list may not describe all possible side effects. Call your doctor for medical advice about side effects. You may report side effects to FDA at 1-800-FDA-1088. Where should I keep my medication? This medication is given in a hospital or clinic. It will not be stored at home. NOTE: This sheet is a summary. It may not cover all possible information. If you have questions about this medicine, talk to your doctor, pharmacist, or health care provider.  2023 [employer]vier/Gold Standard (2013-04-04 00:00:00)   

## 2022-08-26 ENCOUNTER — Encounter: Payer: Medicaid Other | Admitting: Physician Assistant

## 2022-08-26 DIAGNOSIS — I87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 hypertension (idiopathic) with ulcer and inflammation of left lower extremity: Secondary | ICD-10-CM | POA: Diagnosis not present

## 2022-08-26 LAB — T4: T4, Total: 8.6 ug/dL (ref 4.5–12.0)

## 2022-08-26 NOTE — Progress Notes (Signed)
XERXES, AGRUSA ([id number]) [id number] Page 1 of 10 Visit Report for 08/26/2022 Arrival Information Details Patient Name: Date of Service: Chad [name], Chad [name] 08/26/2022 9:30 A M Medical Record Number: [id number] Patient Account Number: [id number] Date of Birth/Sex: Treating RN: 1960-08-18 (62 y.o. Seward Meth Primary Care Montia Haslip: [name] Other Clinician: Referring Bary Limbach: Treating Jatin Naumann/Extender: [name] in Treatment: 26 Visit Information History Since Last Visit Added or deleted any medications: No Patient Arrived: Wheel Chair Any new allergies or adverse reactions: No Arrival Time: 09:44 Had a fall or experienced change in No Accompanied By: self activities of daily living that may affect Transfer Assistance: None risk of falls: Patient Requires Transmission-Based No Hospitalized since last visit: No Precautions: Has Dressing in Place as Prescribed: Yes Patient Has Alerts: Yes Pain Present Now: No Patient Alerts: Patient on Blood Thinner NOT diabetic aspirin '81mg'$  Lives [facility] Electronic Signature(s) Signed: 08/26/2022 4:15:30 PM By: Rosalio Loud MSN RN CNS WTA Entered By: Rosalio Loud on 08/26/2022 09:50:46 -------------------------------------------------------------------------------- Clinic Level of Care Assessment Details Patient Name: Date of Service: Chad [name], Chad [name] 08/26/2022 9:30 A M Medical Record Number: [id number] Patient Account Number: [id number] Date of Birth/Sex: Treating RN: 1960/09/24 (62 y.o. Seward Meth Primary Care Kaine Mcquillen: [name] Other Clinician: Referring Peterson Mathey: Treating Isaah Furry/Extender: [name] in Treatment: 37 Clinic Level of Care Assessment Items TOOL 1 Quantity Score '[]'$  - 0 Use when EandM and Procedure is performed on INITIAL visit ASSESSMENTS - Nursing Assessment / Reassessment '[]'$  - 0 General Physical Exam (combine w/  comprehensive assessment (listed just below) when performed on new pt. evals) '[]'$  - 0 Comprehensive Assessment (HX, ROS, Risk Assessments, Wounds Hx, etc.) ASSESSMENTS - Wound and Skin Assessment / Reassessment CECILIA, NISHIKAWA ([id number]) [id number] Page 2 of 10 '[]'$  - 0 Dermatologic / Skin Assessment (not related to wound area) ASSESSMENTS - Ostomy and/or Continence Assessment and Care '[]'$  - 0 Incontinence Assessment and Management '[]'$  - 0 Ostomy Care Assessment and Management (repouching, etc.) PROCESS - Coordination of Care '[]'$  - 0 Simple Patient / Family Education for ongoing care '[]'$  - 0 Complex (extensive) Patient / Family Education for ongoing care '[]'$  - 0 Staff obtains Programmer, systems, Records, T Results / Process Orders est '[]'$  - 0 Staff telephones HHA, Nursing Homes / Clarify orders / etc '[]'$  - 0 Routine Transfer to another Facility (non-emergent condition) '[]'$  - 0 Routine Hospital Admission (non-emergent condition) '[]'$  - 0 New Admissions / Biomedical engineer / Ordering NPWT Apligraf, etc. , '[]'$  - 0 Emergency Hospital Admission (emergent condition) PROCESS - Special Needs '[]'$  - 0 Pediatric / Minor Patient Management '[]'$  - 0 Isolation Patient Management '[]'$  - 0 Hearing / Language / Visual special needs '[]'$  - 0 Assessment of Community assistance (transportation, D/C planning, etc.) '[]'$  - 0 Additional assistance / Altered mentation '[]'$  - 0 Support Surface(s) Assessment (bed, cushion, seat, etc.) INTERVENTIONS - Miscellaneous '[]'$  - 0 External ear exam '[]'$  - 0 Patient Transfer (multiple staff / Civil Service fast streamer / Similar devices) '[]'$  - 0 Simple Staple / Suture removal (25 or less) '[]'$  - 0 Complex Staple / Suture removal (26 or more) '[]'$  - 0 Hypo/Hyperglycemic Management (do not check if billed separately) '[]'$  - 0 Ankle / Brachial Index (ABI) - do not check if billed separately Has the patient been seen at the hospital within the last three years: Yes Total  Score: 0 Level Of Care: ____ Electronic Signature(s) Signed: 08/26/2022 4:15:30 PM By: Rosalio Loud MSN RN CNS  WTA Entered By: Rosalio Loud on 08/26/2022 10:11:27 -------------------------------------------------------------------------------- Encounter Discharge Information Details Patient Name: Date of Service: Chad [name], Chad [name] 08/26/2022 9:30 A M Medical Record Number: [id number] Patient Account Number: [id number] Date of Birth/Sex: Treating RN: [birth year] (62 y.o. Seward Meth Primary Care Princeston Blizzard: [name] Other Clinician: Referring Shulamis Wenberg: Treating Elisheva Fallas/Extender: [name] in Treatment: [address] [place] ([id number]) [id number] Page 3 of 10 Encounter Discharge Information Items Post Procedure Vitals Discharge Condition: Stable Temperature (F): 98.0 Ambulatory Status: Wheelchair Pulse (bpm): 87 Discharge Destination: Home Respiratory Rate (breaths/min): 16 Transportation: Private Auto Blood Pressure (mmHg): 130/81 Accompanied By: self Schedule Follow-up Appointment: Yes Clinical Summary of Care: Electronic Signature(s) Signed: 08/26/2022 4:15:30 PM By: Rosalio Loud MSN RN CNS WTA Entered By: Rosalio Loud on 08/26/2022 10:14:15 -------------------------------------------------------------------------------- Lower Extremity Assessment Details Patient Name: Date of Service: Chad [name], Chad [name] 08/26/2022 9:30 A M Medical Record Number: [id number] Patient Account Number: [id number] Date of Birth/Sex: Treating RN: 1960-08-29 (62 y.o. Seward Meth Primary Care Batina Dougan: [name] Other Clinician: Referring Nickolaus Bordelon: Treating Cyan Moultrie/Extender: [name] in Treatment: 37 Edema Assessment Assessed: Shirlyn Goltz: No] Patrice Paradise: No] [Left: Edema] [Right: :] Calf Left: Right: Point of Measurement: 33 cm From Medial Instep 33 cm Ankle Left: Right: Point of Measurement: 12 cm From Medial Instep 22  cm Vascular Assessment Pulses: Dorsalis Pedis Palpable: [Left:Yes] Electronic Signature(s) Signed: 08/26/2022 4:15:30 PM By: Rosalio Loud MSN RN CNS WTA Entered By: Rosalio Loud on 08/26/2022 09:57:50 [name] ([id number]) [id number] Page 4 of 10 -------------------------------------------------------------------------------- Multi Wound Chart Details Patient Name: Date of Service: Chad [name], Chad [name] 08/26/2022 9:30 A M Medical Record Number: [id number] Patient Account Number: [id number] Date of Birth/Sex: Treating RN: 07-09-1961 (62 y.o. Seward Meth Primary Care Addisynn Vassell: [name] Other Clinician: Referring Trenda Corliss: Treating Jaylah Goodlow/Extender: [name] in Treatment: 37 Vital Signs Height(in): 69 Pulse(bpm): 63 Weight(lbs): 150 Blood Pressure(mmHg): 130/81 Body Mass Index(BMI): 22.1 Temperature(F): 98.0 Respiratory Rate(breaths/min): 18 [12:Photos:] [N/A:N/A] Left, Distal, Medial Ankle Left, Dorsal Foot N/A Wound Location: Gradually Appeared Pressure Injury N/A Wounding Event: Venous Leg Ulcer Pressure Ulcer N/A Primary Etiology: Anemia, Chronic Obstructive Anemia, Chronic Obstructive N/A Comorbid History: Pulmonary Disease (COPD), Coronary Pulmonary Disease (COPD), Coronary Artery Disease, Peripheral Arterial Artery Disease, Peripheral Arterial Disease, Peripheral Venous Disease, Disease, Peripheral Venous Disease, Hepatitis C, Osteoarthritis, Hepatitis C, Osteoarthritis, Neuropathy, Received Chemotherapy Neuropathy, Received Chemotherapy 07/12/2019 08/11/2022 N/A Date Acquired: 37 2 N/A Weeks of Treatment: Open Open N/A Wound Status: No No N/A Wound Recurrence: 2.3x0.7x0.2 0.4x0.4x0.1 N/A Measurements L x W x D (cm) 1.264 0.126 N/A A (cm) : rea 0.253 0.013 N/A Volume (cm) : 81.70% 83.90% N/A % Reduction in Area: 81.70% 83.50% N/A % Reduction in Volume: Full Thickness Without Exposed  Category/Stage III N/A Classification: Support Structures None Present Medium N/A Exudate Amount: N/A Serosanguineous N/A Exudate Type: N/A red, brown N/A Exudate Color: Distinct, outline attached Flat and Intact N/A Wound Margin: None Present (0%) Large (67-100%) N/A Granulation Amount: N/A Red N/A Granulation Quality: None Present (0%) Small (1-33%) N/A Necrotic Amount: Fat Layer (Subcutaneous Tissue): Yes Fat Layer (Subcutaneous Tissue): Yes N/A Exposed Structures: Fascia: No Fascia: No Tendon: No Tendon: No Muscle: No Muscle: No Joint: No Joint: No Bone: No Bone: No Large (67-100%) None N/A Epithelialization: Treatment Notes Electronic Signature(s) Signed: 08/26/2022 4:15:30 PM By: Rosalio Loud MSN RN CNS WTA Entered By: Rosalio Loud on 08/26/2022 10:08:00 [name] ([id number]) [id number] Page 5 of 10 -------------------------------------------------------------------------------- Multi-Disciplinary Care Plan Details Patient  Name: Date of Service: Chad [name], Chad [name] 08/26/2022 9:30 A M Medical Record Number: [id number] Patient Account Number: [id number] Date of Birth/Sex: Treating RN: 06-Oct-1960 (62 y.o. Seward Meth Primary Care Lurlean Kernen: [name] Other Clinician: Referring Dakwan Pridgen: Treating Orvie Caradine/Extender: [name] in Treatment: 37 Active Inactive Venous Leg Ulcer Nursing Diagnoses: Knowledge deficit related to disease process and management Goals: Patient will maintain optimal edema control Date Initiated: 12/06/2021 Date Inactivated: 12/27/2021 Target Resolution Date: 01/03/2022 Goal Status: Met Patient/caregiver will verbalize understanding of disease process and disease management Date Initiated: 12/06/2021 Target Resolution Date: 05/29/2022 Goal Status: Active Interventions: Assess peripheral edema status every visit. Compression as ordered Notes: Electronic Signature(s) Signed:  08/26/2022 4:15:30 PM By: Rosalio Loud MSN RN CNS WTA Entered By: Rosalio Loud on 08/26/2022 10:12:42 -------------------------------------------------------------------------------- Pain Assessment Details Patient Name: Date of Service: Chad [name], Chad [name] 08/26/2022 9:30 A M Medical Record Number: [id number] Patient Account Number: [id number] Date of Birth/Sex: Treating RN: [birth year] (62 y.o. Seward Meth Primary Care Taher Vannote: [name] Other Clinician: Referring Cinzia Devos: Treating Abayomi Pattison/Extender: [name] in Treatment: 37 Active Problems Location of Pain Severity and Description of Pain Patient Has Paino No Site Locations [place], [place] ([id number]) [id number] Page 6 of 10 Pain Management and Medication Current Pain Management: Electronic Signature(s) Signed: 08/26/2022 4:15:30 PM By: Rosalio Loud MSN RN CNS WTA Entered By: Rosalio Loud on 08/26/2022 09:51:22 -------------------------------------------------------------------------------- Patient/Caregiver Education Details Patient Name: Date of Service: Chad [name] 1/30/2024andnbsp9:30 A M Medical Record Number: [id number] Patient Account Number: [id number] Date of Birth/Gender: Treating RN: September 25, 1960 (62 y.o. Seward Meth Primary Care Physician: [name] Other Clinician: Referring Physician: Treating Physician/Extender: [name] in Treatment: 37 Education Assessment Education Provided To: Patient Education Topics Provided Wound Debridement: Handouts: Wound Debridement Methods: Explain/Verbal Responses: State content correctly Electronic Signature(s) Signed: 08/26/2022 4:15:30 PM By: Rosalio Loud MSN RN CNS WTA Entered By: Rosalio Loud on 08/26/2022 10:12:37 [name] ([id number]) [id number] Page 7 of 10 -------------------------------------------------------------------------------- Wound  Assessment Details Patient Name: Date of Service: Chad [name], Chad [name] 08/26/2022 9:30 A M Medical Record Number: [id number] Patient Account Number: [id number] Date of Birth/Sex: Treating RN: 1960/08/20 (62 y.o. Seward Meth Primary Care Angeni Chaudhuri: [name] Other Clinician: Referring River Mckercher: Treating Eliza Green/Extender: [name] in Treatment: 37 Wound Status Wound Number: 12 Primary Venous Leg Ulcer Etiology: Wound Location: Left, Distal, Medial Ankle Wound Open Wounding Event: Gradually Appeared Status: Date Acquired: 07/12/2019 Comorbid Anemia, Chronic Obstructive Pulmonary Disease (COPD), Coronary Weeks Of Treatment: 37 History: Artery Disease, Peripheral Arterial Disease, Peripheral Venous Clustered Wound: No Disease, Hepatitis C, Osteoarthritis, Neuropathy, Received Chemotherapy Photos Wound Measurements Length: (cm) 2.3 Width: (cm) 0.7 Depth: (cm) 0.2 Area: (cm) 1.264 Volume: (cm) 0.253 % Reduction in Area: 81.7% % Reduction in Volume: 81.7% Epithelialization: Large (67-100%) Wound Description Classification: Full Thickness Without Exposed Support Structures Wound Margin: Distinct, outline attached Exudate Amount: None Present Foul Odor After Cleansing: No Slough/Fibrino No Wound Bed Granulation Amount: None Present (0%) Exposed Structure Necrotic Amount: None Present (0%) Fascia Exposed: No Fat Layer (Subcutaneous Tissue) Exposed: Yes Tendon Exposed: No Muscle Exposed: No Joint Exposed: No Bone Exposed: No Treatment Notes Wound #12 (Ankle) Wound Laterality: Left, Medial, Distal Cleanser Soap and Water Discharge Instruction: Gently cleanse wound with antibacterial soap, rinse and pat dry prior to dressing wounds Wound Cleanser [name] ([id number]) [id number] Page 8 of 10 Discharge Instruction: Wash your hands with soap and water. Remove old dressing, discard into plastic bag  and place into  trash. Cleanse the wound with Wound Cleanser prior to applying a clean dressing using gauze sponges, not tissues or cotton balls. Do not scrub or use excessive force. Pat dry using gauze sponges, not tissue or cotton balls. Peri-Wound Care AandD Ointment Discharge Instruction: Apply AandD Ointment to areas of redness Topical Primary Dressing Hydrofera Blue Ready Transfer Foam, 4x5 (in/in) Discharge Instruction: Apply Hydrofera Blue Ready to wound bed as directed Secondary Dressing ABD Pad 5x9 (in/in) Discharge Instruction: Cover with ABD pad Secured With Kerlix Roll Sterile or Non-Sterile 6-ply 4.5x4 (yd/yd) Discharge Instruction: Apply Kerlix as directed Compression Wrap Compression Stockings Add-Ons Electronic Signature(s) Signed: 08/26/2022 4:15:30 PM By: Rosalio Loud MSN RN CNS WTA Entered By: Rosalio Loud on 08/26/2022 09:56:57 -------------------------------------------------------------------------------- Wound Assessment Details Patient Name: Date of Service: Chad [name] 08/26/2022 9:30 A M Medical Record Number: [id number] Patient Account Number: [id number] Date of Birth/Sex: Treating RN: 06/15/1961 (62 y.o. Seward Meth Primary Care Teal Bontrager: [name] Other Clinician: Referring Aran Menning: Treating Karmello Abercrombie/Extender: [name] in Treatment: 37 Wound Status Wound Number: 13 Primary Pressure Ulcer Etiology: Wound Location: Left, Dorsal Foot Wound Open Wounding Event: Pressure Injury Status: Date Acquired: 08/11/2022 Comorbid Anemia, Chronic Obstructive Pulmonary Disease (COPD), Coronary Weeks Of Treatment: 2 History: Artery Disease, Peripheral Arterial Disease, Peripheral Venous Clustered Wound: No Disease, Hepatitis C, Osteoarthritis, Neuropathy, Received Chemotherapy Photos Chad [name], Chad [name] ([id number]) [id number] Page 9 of 10 Wound Measurements Length: (cm) 0.4 Width: (cm) 0.4 Depth: (cm) 0.1 Area:  (cm) 0.126 Volume: (cm) 0.013 % Reduction in Area: 83.9% % Reduction in Volume: 83.5% Epithelialization: None Wound Description Classification: Category/Stage III Wound Margin: Flat and Intact Exudate Amount: Medium Exudate Type: Serosanguineous Exudate Color: red, brown Wound Bed Granulation Amount: Large (67-100%) Exposed Structure Granulation Quality: Red Fascia Exposed: No Necrotic Amount: Small (1-33%) Fat Layer (Subcutaneous Tissue) Exposed: Yes Necrotic Quality: Adherent Slough Tendon Exposed: No Muscle Exposed: No Joint Exposed: No Bone Exposed: No Treatment Notes Wound #13 (Foot) Wound Laterality: Dorsal, Left Cleanser Peri-Wound Care Topical Primary Dressing Secondary Dressing Secured With Compression Wrap Compression Stockings Add-Ons Electronic Signature(s) Signed: 08/26/2022 4:15:30 PM By: Rosalio Loud MSN RN CNS WTA Entered By: Rosalio Loud on 08/26/2022 09:57:19 Vitals Details -------------------------------------------------------------------------------- [name] ([id number]) [id number] Page 10 of 10 Patient Name: Date of Service: Chad [name], Chad [name] 08/26/2022 9:30 A M Medical Record Number: [id number] Patient Account Number: [id number] Date of Birth/Sex: Treating RN: Nov 27, 1960 (62 y.o. Seward Meth Primary Care Manan Olmo: [name] Other Clinician: Referring Lamaj Metoyer: Treating Allahna Husband/Extender: [name] in Treatment: 37 Vital Signs Time Taken: 09:50 Temperature (F): 98.0 Height (in): 69 Pulse (bpm): 87 Weight (lbs): 150 Respiratory Rate (breaths/min): 18 Body Mass Index (BMI): 22.1 Blood Pressure (mmHg): 130/81 Reference Range: 80 - 120 mg / dl Electronic Signature(s) Signed: 08/26/2022 4:15:30 PM By: Rosalio Loud MSN RN CNS WTA Entered By: Rosalio Loud on 08/26/2022 09:51:16

## 2022-08-26 NOTE — Progress Notes (Signed)
RAMIN, ZOLL ([id number]) [id number] Page 1 of 12 Visit Report for 08/26/2022 Chief Complaint Document Details Patient Name: Date of Service: Chad [name], Chad [name] 08/26/2022 9:30 A M Medical Record Number: [id number] Patient Account Number: [id number] Date of Birth/Sex: Treating RN: 1960-09-02 (62 y.o. [place] Meth Primary Care Provider: [name] Other Clinician: Referring Provider: Treating Provider/Extender: [name] in Treatment: 37 Information Obtained from: Patient Chief Complaint Left ankle ulcers and left foot ulcers Electronic Signature(s) Signed: 08/26/2022 10:15:29 AM By: Worthy Keeler PA-C Entered By: Worthy Keeler on 08/26/2022 10:15:29 -------------------------------------------------------------------------------- Debridement Details Patient Name: Date of Service: Chad [name], Chad [name] 08/26/2022 9:30 A M Medical Record Number: [id number] Patient Account Number: [id number] Date of Birth/Sex: Treating RN: 12/06/1960 (62 y.o. Seward Meth Primary Care Provider: [name] Other Clinician: Referring Provider: Treating Provider/Extender: [name] in Treatment: 37 Debridement Performed for Assessment: Wound #12 Left,Distal,Medial Ankle Performed By: Physician Tommie Sams., PA-C Debridement Type: Debridement Severity of Tissue Pre Debridement: Fat layer exposed Level of Consciousness (Pre-procedure): Awake and Alert Pre-procedure Verification/Time Out Yes - 10:08 Taken: Start Time: 10:08 T Area Debrided (L x W): otal 2.3 (cm) x 0.7 (cm) = 1.61 (cm) Tissue and other material debrided: Viable, Non-Viable, Slough, Subcutaneous, Biofilm, Slough Level: Skin/Subcutaneous Tissue Debridement Description: Excisional Instrument: Curette Bleeding: Minimum Hemostasis Achieved: Pressure Response to Treatment: Procedure was tolerated well Level of Consciousness (Post- Awake and  Alert procedure): Chad [name], Chad [name] ([id number]) [id number] Page 2 of 12 Post Debridement Measurements of Total Wound Length: (cm) 2.3 Width: (cm) 0.7 Depth: (cm) 0.3 Volume: (cm) 0.379 Character of Wound/Ulcer Post Debridement: Stable Severity of Tissue Post Debridement: Fat layer exposed Post Procedure Diagnosis Same as Pre-procedure Electronic Signature(s) Signed: 08/26/2022 4:15:30 PM By: Rosalio Loud MSN RN CNS WTA Signed: 08/26/2022 4:45:54 PM By: Worthy Keeler PA-C Entered By: Rosalio Loud on 08/26/2022 10:09:11 -------------------------------------------------------------------------------- HPI Details Patient Name: Date of Service: Chad [name] 08/26/2022 9:30 A M Medical Record Number: [id number] Patient Account Number: [id number] Date of Birth/Sex: Treating RN: [birth year] (61 y.o. Seward Meth Primary Care Provider: [name] Other Clinician: Referring Provider: Treating Provider/Extender: [name] in Treatment: 37 History of Present Illness HPI Description: 10/08/18 on evaluation today patient actually presents to our office for initial evaluation concerning wounds that he has of the bilateral lower extremities. 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 He also has bilateral venous stasis. He apparently has been homeless currently following discharge from the hospital apparently he has been placed at [facility] which is is a skilled nursing facility locally. Nonetheless fortunately he does not show any signs of infection at this time which is good news. In fact several of the wound actually appears to be showing some signs of improvement already in my pinion. There are a couple areas in the left leg in particular there likely gonna require some sharp  debridement to help clear away some necrotic tissue and help with more sufficient healing. No fevers, chills, nausea, or vomiting noted at this time. 10/15/18 on evaluation today patient actually appears to be doing very well in regard to his bilateral lower extremities. He's been tolerating the dressing changes without complication. Fortunately there does not appear to be any evidence of active infection at this time which is great news. Overall I'm actually very pleased  with how this has progressed in just one visits time. Readmission: 08/14/2020 upon evaluation today patient presents for re-evaluation here in our clinic. He is having issues with his left ankle region as well as his right toe and his right heel. He tells me that the toe and heel actually began as a area that was itching that he was scratching and then subsequently opened up into wounds. These may have been abscess areas I presume based on what I am seeing currently. With regard to his left ankle region he tells me this was a similar type occurrence although he does have venous stasis this very well may be more of a venous leg ulcer more than anything. Nonetheless I do believe that the patient would benefit from appropriate and aggressive wound care to try to help get things under better control here. He does have history of a stroke on the left side affecting him to some degree there that he is able to stand although he does have some residual weakness. Otherwise again the patient does have chronic venous insufficiency as previously noted. His arterial studies most recently obtained showed that he had an ABI on the right of 1.16 with a TBI of 0.52 and on the left and ABI of 1.14 with a TBI of 0.81. That was obtained on 06/19/2020. 08/28/2020 upon evaluation today patient appears to be doing decently well in regard to his wounds in general. He has been tolerating the dressing changes without complication. Fortunately there does not  appear to be any signs of active infection which is great news. With that being said I think the [facility] is doing a good job I would recommend that we likely continue with that currently. 09/11/2020 upon evaluation today patient's wounds did not appear to be doing too poorly but again he is not really showing signs of significant improvement with regard to any of the wounds on the right. None of them have Hydrofera Blue on them I am not exactly sure why this is not being followed as the facility did not contact us to let us know of any issues with obtaining dressings or otherwise. With that being said he is supposed to be using Hydrofera Blue on both of the wounds on the right foot as well as the ankle wound on the left side. 09/18/2020 upon evaluation today patient appears to be doing poorly with regard to his wounds. Again right now the left ankle in particular showed signs of extreme maceration. Apparently he was told by someone with staff at [place] they could not get the [facility]. With that being said this is something that is never been relayed to [place] one way or another. Also the patient subsequently has not supposed to have a border gauze dressing on. He should have an ABD pad and roll gauze to secure as this drains much too much just to have a border gauze dressing to cover. Nonetheless the fact that they are not using the appropriate dressing is directly causing deterioration of the left ankle wound it is significantly worse today compared to what it was previous. I did attempt to call [facility] while the patient was here I called three times and got no one to even pick up the phone. After this I had my for an office coordinator call and she was able to finally get through and leave a message with the D ON as of dictation of this note which is roughly about an hour and a  Chad [name], Chad [name] ([id number]) [id number] Page 3 of 12 half later  I still have not been able to speak with anyone at the facility. 09/25/2020 upon evaluation today patient actually showing signs of good improvement which is excellent news. He has been tolerating the dressing changes without complication. Fortunately there is no signs of active infection which is great news. No fevers, chills, nausea, vomiting, or diarrhea. I do feel like the facility has been doing a much better job at taking care of him as far as the dressings are concerned. However the director of nursing never did call me back. 10/09/2020 upon evaluation today patient appears to be doing well with regard to his wound. The toe ulcer did require some debridement but the other 2 areas actually appear to be doing quite well. 10/19/2020 upon evaluation today patient actually appears to be doing very well in regard to his wounds. In fact the heel does appear to be completely healed. The toe is doing better in the medial ankle on the left is also doing better. Overall I think he is headed in the right direction. 10/26/2020 upon evaluation today patient appears to be doing well with regard to his wound. He is showing signs of improvement which is great news and overall I am very pleased with where things stand today. No fevers, chills, nausea, vomiting, or diarrhea. 11/02/2020 upon evaluation today patient appears to be doing well with regard to his wounds. He has been tolerating the dressing changes without complication overall I am extremely pleased with where things stand today. He in regard to the toe is almost completely healed and the medial ankle on the left is doing much better. 11/09/2020 upon evaluation today patient appears to be doing a little poorly in regard to his left medial ankle ulcer. Fortunately there does not appear to be any signs of systemic infection but unfortunately locally he does appear to be infected in fact he has blue-green drainage consistent with Pseudomonas. 11/16/2020 upon  evaluation today patient appears to be doing well with regard to his wound. It actually appears to be doing better. I did place him on gentamicin cream since the Cipro was actually resistant even though he was positive for Pseudomonas on culture. Overall I think that he does seem to be doing better though I am unsure whether or not they have actually been putting the cream on. The patient is not sure that we did talk to the nurse directly and she was going to initiate that treatment. Fortunately there does not appear to be any signs of active infection at this time. No fevers, chills, nausea, vomiting, or diarrhea. 4/28; the area on the right second toe is close to healed. Left medial ankle required debridement 12/07/2020 upon evaluation today patient appears to be doing well with regard to his wounds. In fact the right second toe appears to be completely healed which is great news. Fortunately there does not appear to be any signs of active infection at this time which is also great news. I think we can probably discontinue the gentamicin on top of everything else. 12/14/2020 upon evaluation today patient appears to be doing well with regard to his wound. He is making good progress and overall very pleased with where things stand today. There is no signs of active infection at this time which is great news. 12/28/2020 upon evaluation today patient appears to be doing well with regard to his wounds. He has been tolerating the dressing changes without complication. Fortunately there  is no signs of active infection at this time. No fevers, chills, nausea, vomiting, or diarrhea. 12/28/2020 upon evaluation today patient's wound bed actually showed signs of excellent improvement. He has great epithelization and granulation I do not see any signs of infection overall I am extremely pleased with where things stand at this point. No fevers, chills, nausea, vomiting, or diarrhea. 01/11/2021 upon evaluation today  patient appears to be doing well with regard to his wound on his leg. He has been tolerating the dressing changes without complication. Fortunately there does not appear to be any signs of active infection which is great news. No fevers, chills, nausea, vomiting, or diarrhea. 01/25/2021 upon evaluation today patient appears to be doing well with regard to his wound. He has been tolerating the dressing changes without complication. Fortunately the collagen seems to be doing a great job which is excellent news. No fevers, chills, nausea, vomiting, or diarrhea. 02/08/2021 upon evaluation today patient's wound is actually looking a little bit worse especially in the periwound compared to previous. Fortunately there does not appear to be any signs of infection which is great news with that being said he does have some irritation around the periphery of the wound which has me more concerned. He actually had a dressing on that had not been changed in 3 days. He also is supposed to have daily dressing changes. With regard to the dressing applied he had a silver alginate dressing and silver collagen is what is recommended and ordered. He also had no Desitin around the edges of the wound in the periwound region although that is on the order inspect to be done as well. In general I was very concerned I did contact [facility] actually spoke with Magda Paganini who is the scheduling individual and subsequently she stated that she would pass the information to the D ON apparently the D ON was not available to talk to me when I call today. 02/18/2021 upon evaluation today patient's wound is actually showing signs of improvement. Fortunately there does not appear to be any evidence of infection which is great news overall I am extremely pleased with where things stand today. No fevers, chills, nausea, vomiting, or diarrhea. 8/3; patient presents for 1 week follow-up. He has no issues or complaints today. He denies signs  of infection. 03/11/2021 upon evaluation today patient appears to be doing well with regard to his wound. He does have a little bit of slough noted on the surface of the wound but fortunately there does not appear to be any signs of active infection at this time. No fevers, chills, nausea, vomiting, or diarrhea. 03/18/2021 upon evaluation today patient appears to be doing well with regard to his wound. He has been tolerating the dressing changes without complication. There was a little irritation more proximal to where the wound was that was not noted last week but nonetheless this is very superficial just seems to be more irritation we just need to make sure to put a good amount of the zinc over the area in my opinion. Otherwise he does not seem to be doing significantly worse at all which is great news. 03/25/2021 upon evaluation today patient appears to be doing well with regard to his wound. He is going require some sharp debridement today to clear with some of the necrotic debris. I did perform this today without complication postdebridement wound bed appears to be doing much better this is great news. 04/08/2021 upon evaluation today patient appears to be doing  decently well in regard to his wound although the overall measurement is not significantly smaller compared to previous. It is gone down a little bit but still the facility continues to not really put the appropriate dressings in place in fact he was supposed to have collagen we think he probably had more of an allergy to At this point. Fortunately there does not appear to be any signs of active infection systemically though locally I do not see anything on initial visualization either as far as erythema or warmth. 04/15/2021 upon evaluation today patient appears to be doing well with regard to his wound. He is actually showing signs of improvement. I did place him on antibiotics last week, Cipro. He has been taking that 2 times a day and  seems to be tolerating it very well. I do not see any evidence of worsening and in fact the overall appearance of the wound is smaller today which is also great news. 9/26; left medial ankle chronic venous insufficiency wound is improved. Using Hydrofera Blue 10/10; left medial ankle chronic venous insufficiency. Wound has not changed much in appearance completely nonviable surface. Apparently there have been problems getting the right product on the wound at the facility although he came in with [facility] on today 05/14/2021 upon evaluation today patient appears to be doing well with regard to his wound. I think he is making progress here which is good news. Fortunately there does not appear to be any signs of active infection at this time. No fevers, chills, nausea, vomiting, or diarrhea. 05/20/2021 upon evaluation today patient appears to be doing well with regard to his wound. He is showing signs of good improvement which is great news. There does not appear to be any evidence of active infection which is also excellent news. No fevers, chills, nausea, vomiting, or diarrhea. 05/28/2021 upon evaluation today patient appears to be doing quite well. There does not appear to be any signs of active infection at this time which is great ANGELINA, NEECE ([id number]) [id number] Page 4 of 12 news. Overall I am extremely pleased with where things stand today. I think he is headed in the right direction. 06/11/2021 upon evaluation today patient appears to be doing well with regard to his left ankle ulcer and poorly in regard to the toe ulcer on the second toe right foot. This appears to show signs of joint exposure. Apparently this has been present for 1 to 2 months although he kept forgetting to tell me about it. That is unfortunate as right now it definitely appears to be doing significantly worse than what I would like to see. There does not appear to be any signs of  active infection systemically though locally I am concerned about the possibility of infection the toe is quite red. Again no one from the facility ever contacted [place] to advise that this was going on in the interim either. 06/17/2021 upon evaluation today patient presents for follow-up I did review his x-ray which showed a navicular bone fracture I am unsure of the chronicity of this. Subsequently he also had osteomyelitis of the toe which was what I was more concerned about this did not show up on x-ray but did show up on the pathology scrapings. This was listed as acute osteomyelitis. Nonetheless at this point I think that the antibiotic treatment is the best regimen to go with currently. The patient is in agreement with that plan. Nonetheless he has initially 30 days of doxycycline off likely extend that  towards the end of the treatment cycle that will be around the middle of December for an additional 2 weeks. That all depends on how well he continues to heal. Nonetheless based on what I am seeing in the foot I did want a proceed with an MRI as well which I think will be helpful to identify if there is anything else that needs to be addressed from the standpoint of infection. 06/24/2021 upon evaluation today patient appears to be doing pretty well in regards to his wounds. I think both are actually showing signs of improvement which is good I did review his MRI today which did show signs of osteomyelitis of the middle and proximal phalanx on his right foot of the affected toe. With that being said this is actually showing signs of significant improvement today already with the antibiotic therapy I think the redness is also improved. Overall I think that we just need to give this some time with appropriate wound care we will see how things go potentially hyperbarics could be considered. 07/02/2021 upon inspection today patient actually appears to be doing well in regard to his left ankle which is  getting very close to complete resolution of pleased in that regard. Unfortunately he is continuing to have issues with his second toe right foot and this seems to still be very painful for him. Recommend he try something different from the standpoint of antibiotics. 07/15/2021 upon evaluation today patient appears to be doing actually pretty well in regard to his foot. This is actually showing signs of significant improvement which is great news. Overall I feel like the patient is improving both in regard to the second toe as well as the ankle on the left. With that being said the biggest issue that I do see currently is that he is needing to have a refill of the doxycycline that we previously treated him with. He also did see podiatry they are not going to recommend any amputation at this point since he seems to be doing quite well. For that reason we just need to keep things under control from an infection standpoint. 08/01/2021 upon evaluation today patient appears to be doing well with regard to his wound. He has been tolerating the dressing changes without complication. Fortunately there does not appear to be any evidence of active infection locally nor systemically at this point. In fact I think everything is doing excellent in fact his second toe on the right foot is almost healed and the ankle on the left ankle region is actually very close to being healed as well. 08/08/2021 upon evaluation today patient appears to be doing well with regard to his wound. He has been tolerating the dressing changes without complication. Fortunately I do not see any signs of active infection at this time. Readmission: 12-06-2021 upon evaluation today patient presents for reevaluation here in the clinic he does tell me that he was being seen in facility at [place] by a provider that was coming in. He is not sure who this was. He tells me however that the wound seems to have gotten worse even compared to  where it was when we last saw him at this point. With that being said I do believe that he is likely going need ongoing wound care here in the clinic and I do believe that we need to be the ones to frontline this since his wound does seem to be getting worse not better at this point. He voiced understanding. He  is also in agreement with this plan and feels more comfortable coming here she tells me. Patient's medical history really has not changed since his prior admission he was only gone since January. 12-27-2021 upon evaluation today patient appears to be doing well with regard to his wound they did run out of the [facility] so they did not put anything on just an ABD pad with gentamicin. Still we are seeing some signs of good improvement here with some new epithelization which is great news. 01-10-2022 upon evaluation today patient appears to be doing well with regard to his wounds and he is going require some sharp debridement but overall seems to be making good progress. Fortunately I do not see any evidence of active infection locally or systemically at this time which is great news. 01-24-2022 upon evaluation today patient appears to be doing well with regard to his wound. The facility actually came and dropped him off early and he had another appointment at the hospital and then they just brought him over here and this was still hours before his appointment this afternoon. For that reason we did do our best to work him in this morning and fortunately had some space to make this happen. With that being said patient's wound does seem to be making progress here and I am very pleased in that regard I do not see any signs of active infection locally or systemically at this time. 02-07-2022 patient appears to be doing well currently in regard to his wounds. In fact one of them the more proximal is healed the distal is still open but seems to be doing excellent. Fortunately I do not see any evidence of  active infection locally or systemically at this time which is great news. No fevers, chills, nausea, vomiting, or diarrhea. 7/27; left medial ankle venous. Improving per our intake nurse. We are using Hydrofera Blue under Tubigrip compression. They are changing that at his facility 03-06-2022 upon evaluation today patient appears to be doing well with regard to the his wound he is can require some sharp debridement but seems to be making excellent progress. Fortunately I do not see any evidence of active infection locally or systemically which is great news. 03-27-2022 upon evaluation today patient's wound is actually showing signs of excellent improvement. Fortunately I see no signs of active infection locally or systemically at this time which is great news. No fevers, chills, nausea, vomiting, or diarrhea. 04-21-2022 upon evaluation today patient appears to be doing excellent in regard to his wound in fact this is very close to resolution based on what I am seeing. I do not see any evidence of active infection locally or systemically at this time which is great news and overall I am extremely pleased with where we are today. 05-05-2022 upon evaluation today patient's wound actually appears to potentially be completely healed. Fortunately I do not see any evidence of active infection at this time which is great news and overall I am very pleased I think this needs a little time to toughen up but other than that I really do believe were doing quite well. He is very pleased to hear this its been a long time coming. 05-19-2022 upon evaluation today patient appears to be doing well currently in regard to his wound. He in fact we were hoping will be completely healed and closed but it still has a very tiny area right in the center which is still continuing to drain. Fortunately I do not see  any signs of infection locally or systemically which is great news. 11/6; this wound is close to completely" he comes  in this week with some erythema at 1 edge of this which looks like something was rubbing on the area. Other than that no evidence of infection 06-16-2022 upon evaluation today patient appears to be doing well currently in regard to his ankle ulcer. This is very close to being healed but still has a small area in the central portion which is not. Fortunately there does not appear to be any signs of infection locally or systemically which is great news. No fevers, chills, nausea, vomiting, or diarrhea. 06-30-2022 upon evaluation patient's wound pretty much appears to be almost completely closed. In fact it may even be closed but there are still a lot of skin irritation around the edges of the wound and I just do not feel great about releasing them yet I would like to continue to monitor this just a little bit longer before SHERILL, WEGENER ([id number]) [id number] Page 5 of 12 getting to that point. He is not opposed to this and in fact is happy to continue to come in if need be in order to make sure that things are moving in the right direction he definitely does not want to backtrack. 07-14-2022 upon evaluation today patient appears to be doing well currently in regard to his wound. Has been tolerating the dressing changes without complication. Fortunately I see no evidence of active infection locally nor systemically which is great news. No fevers, chills, nausea, vomiting, or diarrhea. 08-11-2022 upon evaluation today patient appears to be doing a little worse than last time I saw him although it has been a month. Unfortunately he was not able to be seen due to the fact that he was quarantined in the facility with COVID. Subsequently he tells me that he mention to the facility staff several times about taking it off over the past several weeks but they continue to tell him that someone have to get in touch with [place] here although nobody ever did until Friday when the nurse  manager called to have that documented in the communication notes. With that being said unfortunately this means that he had a wrap on that he was supposed to have on for only 1 week for ended up being on four 1 month essentially. I am glad that there is nothing worse than what we see currently to be perfectly honest. 08-19-2022 upon evaluation today patient appears to be doing well currently in regard to his wounds. He has been tolerating the dressing changes without complication. This is actually smaller today which is great news after last week's reopening. Fortunately I do not see any evidence of infection locally nor systemically at this point. 08-26-2022 upon evaluation today patient appears to be doing well currently in regard to his wound. He has been tolerating the dressing changes without complication. Fortunately there does not appear to be any signs of active infection locally nor systemically which is great news. No fevers, chills, nausea, vomiting, or diarrhea. Electronic Signature(s) Signed: 08/26/2022 10:15:44 AM By: Worthy Keeler PA-C Entered By: Worthy Keeler on 08/26/2022 10:15:43 -------------------------------------------------------------------------------- Physical Exam Details Patient Name: Date of Service: Chad [name], Chad [name] 08/26/2022 9:30 A M Medical Record Number: [id number] Patient Account Number: [id number] Date of Birth/Sex: Treating RN: 06-17-1961 (62 y.o. Seward Meth Primary Care Provider: [name] Other Clinician: Referring Provider: Treating Provider/Extender: [name] in Treatment: 57 Constitutional Well-nourished and  well-hydrated in no acute distress. Respiratory normal breathing without difficulty. Psychiatric this patient is able to make decisions and demonstrates good insight into disease process. Alert and Oriented x 3. pleasant and cooperative. Notes Upon inspection patient's wound bed actually showed signs of good  granulation and epithelization at this point. Fortunately I do not see any signs of infection he is going require some debridement in regard to the ankle top of the foot looks to be doing well. I did perform this debridement he tolerated that today without complication. Postdebridement the wound bed is significantly improved. Electronic Signature(s) Signed: 08/26/2022 10:16:03 AM By: Worthy Keeler PA-C Entered By: Worthy Keeler on 08/26/2022 10:16:03 [name] ([id number]) [id number] Page 6 of 12 -------------------------------------------------------------------------------- Physician Orders Details Patient Name: Date of Service: Chad [name], Chad [name] 08/26/2022 9:30 A M Medical Record Number: [id number] Patient Account Number: [id number] Date of Birth/Sex: Treating RN: 08-29-1960 (62 y.o. Seward Meth Primary Care Provider: [name] Other Clinician: Referring Provider: Treating Provider/Extender: [name] in Treatment: [phone] Verbal / Phone Orders: No Diagnosis Coding Follow-up Appointments Return Appointment in 1 week. Bathing/ Shower/ Hygiene May shower with wound dressing protected with water repellent cover or cast protector. No tub bath. Anesthetic (Use 'Patient Medications' Section for Anesthetic Order Entry) Lidocaine applied to wound bed Edema Control - Lymphedema / Segmental Compressive Device / Other Tubigrip single layer applied. - Tubi D single layer Elevate legs to the level of the heart and pump ankles as often as possible Elevate leg(s) parallel to the floor when sitting. Medications-Please add to medication list. Other: - apply AandD ointment to leg, do not apply to wound Wound Treatment Wound #12 - Ankle Wound Laterality: Left, Medial, Distal Cleanser: Soap and Water Every Other Day/30 Days Discharge Instructions: Gently cleanse wound with antibacterial soap, rinse and pat dry prior to dressing  wounds Cleanser: Wound Cleanser Every Other Day/30 Days Discharge Instructions: Wash your hands with soap and water. Remove old dressing, discard into plastic bag and place into trash. Cleanse the wound with Wound Cleanser prior to applying a clean dressing using gauze sponges, not tissues or cotton balls. Do not scrub or use excessive force. Pat dry using gauze sponges, not tissue or cotton balls. Peri-Wound Care: AandD Ointment Every Other Day/30 Days Discharge Instructions: Apply AandD Ointment to areas of redness Prim Dressing: Hydrofera Blue Ready Transfer Foam, 4x5 (in/in) Every Other Day/30 Days ary Discharge Instructions: Apply Hydrofera Blue Ready to wound bed as directed Secondary Dressing: ABD Pad 5x9 (in/in) Every Other Day/30 Days Discharge Instructions: Cover with ABD pad Secured With: Kerlix Roll Sterile or Non-Sterile 6-ply 4.5x4 (yd/yd) Every Other Day/30 Days Discharge Instructions: Apply Kerlix as directed Electronic Signature(s) Signed: 08/26/2022 4:15:30 PM By: Rosalio Loud MSN RN CNS WTA Signed: 08/26/2022 4:45:54 PM By: Worthy Keeler PA-C Entered By: Rosalio Loud on 08/26/2022 10:13:20 [name] ([id number]) [id number] Page 7 of 12 -------------------------------------------------------------------------------- Problem List Details Patient Name: Date of Service: FINLEE, Chad [name] 08/26/2022 9:30 A M Medical Record Number: [id number] Patient Account Number: [id number] Date of Birth/Sex: Treating RN: 1961-05-04 (62 y.o. 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 Seward Meth Primary Care Provider: [name] Other Clinician: Referring Provider: Treating Provider/Extender: [name] in Treatment: [address] [place] ([id number]) [id number] Page 8 of 12 Subjective Chief Complaint Information obtained from Patient Left ankle ulcers and left foot ulcers History of Present Illness (HPI) 10/08/18 on evaluation today patient actually presents to our office for initial evaluation concerning wounds that he has of the bilateral lower extremities. 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 He also has bilateral venous stasis. He apparently has been homeless currently following discharge from the hospital apparently he has been placed at [facility] which is is a skilled nursing facility locally. Nonetheless fortunately he does not show any signs of infection at this time which is good news. In fact several of the wound actually appears to be showing some signs of improvement already in my pinion. There are a couple areas in the left leg in  particular there likely gonna require some sharp debridement to help clear away some necrotic tissue and help with more sufficient healing. No fevers, chills, nausea, or vomiting noted at this time. 10/15/18 on evaluation today patient actually appears to be doing very well in regard to his bilateral lower extremities. He's been tolerating the dressing changes without complication. Fortunately there does not appear to be any evidence of active infection at this time which is great news. Overall I'm actually very pleased with how this has progressed in just one visits time. Readmission: 08/14/2020 upon evaluation today patient presents for re-evaluation here in our clinic. He is having issues with his left ankle region as well as his right toe and his right heel. He tells me that the toe and heel actually began as a area that was itching that he was scratching and then subsequently opened up into wounds. These may have been abscess areas I presume based on what I am seeing currently. With regard to his left ankle region he tells me this was a similar type occurrence although he does have venous stasis this very well may be more of a venous leg ulcer more than anything. Nonetheless I do believe that the patient would benefit from appropriate and aggressive wound care to try to help get things under better control here. He does have history of a stroke on the left side affecting him to some degree there that he is able to stand although he does have some residual weakness. Otherwise again the patient does have chronic venous insufficiency as previously noted. His arterial studies most recently obtained showed that he had an ABI on the right of 1.16 with a TBI of 0.52 and on the left and ABI of 1.14 with a TBI of 0.81. That was obtained on 06/19/2020. 08/28/2020 upon evaluation today patient appears to be doing decently well in regard to his wounds in general. He has been tolerating the dressing  changes without complication. Fortunately there does not appear to be any signs of active infection which is great news. With that being said I think the [facility] is doing a good job I would recommend that we likely continue with that currently. 09/11/2020 upon evaluation today patient's wounds did not appear to be doing too poorly but again he is not really showing signs of significant improvement with regard to any of the wounds on the right. None of them have Hydrofera Blue on them I am not exactly sure why this is not being followed as the facility did not contact us to let us know of any issues with obtaining dressings or otherwise. With that being said he is supposed to be using Hydrofera Blue on both of the wounds on the right foot as well as the ankle wound on the left side. 09/18/2020 upon evaluation today patient appears to be doing poorly with regard to his wounds. Again right now the left ankle in particular showed signs of extreme maceration. Apparently he was told by someone with staff at [facility] they could not get the [facility]. With that being said this is something that is never been relayed to [place] one way or another. Also the patient subsequently has not supposed to have a border gauze dressing on. He should have an ABD pad and roll gauze to secure as this drains much too much just to have a border gauze dressing to cover. Nonetheless the fact that they are not using the appropriate dressing is directly causing deterioration of the left ankle wound it is significantly worse today compared to what it was previous. I did attempt to call [facility] while the patient was here I called three times and got no one to even pick up the phone. After this I had my for an office coordinator call and she was able to finally get through and leave a message with the D ON as of dictation of this note which is roughly about an hour and a half later I still have not been  able to speak with anyone at the facility. 09/25/2020 upon evaluation today patient actually showing signs of good improvement which is excellent news. He has been tolerating the dressing changes without complication. Fortunately there is no signs of active infection which is great news. No fevers, chills, nausea, vomiting, or diarrhea. I do feel like the facility has been doing a much better job at taking care of him as far as the dressings are concerned. However the director of nursing never did call me back. 10/09/2020 upon evaluation today patient appears to be doing well with regard to his wound. The toe ulcer did require some debridement but the other 2 areas actually appear to be doing quite well. 10/19/2020 upon evaluation today patient actually appears to be doing very well in regard to his wounds. In fact the heel does appear to be completely healed. The toe is doing better in the medial ankle on the left is also doing better. Overall I think he is headed in the right direction. 10/26/2020 upon evaluation today patient appears to be doing well with regard to his wound. He is showing signs of improvement which is great news and overall I am very pleased with where things stand today. No fevers, chills, nausea, vomiting, or diarrhea. 11/02/2020 upon evaluation today patient appears to be doing well with regard to his wounds. He has been tolerating the dressing changes without complication overall I am extremely  pleased with where things stand today. He in regard to the toe is almost completely healed and the medial ankle on the left is doing much better. 11/09/2020 upon evaluation today patient appears to be doing a little poorly in regard to his left medial ankle ulcer. Fortunately there does not appear to be any signs of systemic infection but unfortunately locally he does appear to be infected in fact he has blue-green drainage consistent with Pseudomonas. 11/16/2020 upon evaluation today patient  appears to be doing well with regard to his wound. It actually appears to be doing better. I did place him on gentamicin cream since the Cipro was actually resistant even though he was positive for Pseudomonas on culture. Overall I think that he does seem to be doing better though I am unsure whether or not they have actually been putting the cream on. The patient is not sure that we did talk to the nurse directly and she was going to initiate that treatment. Fortunately there does not appear to be any signs of active infection at this time. No fevers, chills, nausea, vomiting, or diarrhea. 4/28; the area on the right second toe is close to healed. Left medial ankle required debridement 12/07/2020 upon evaluation today patient appears to be doing well with regard to his wounds. In fact the right second toe appears to be completely healed which is great news. Fortunately there does not appear to be any signs of active infection at this time which is also great news. I think we can probably discontinue the gentamicin on top of everything else. 12/14/2020 upon evaluation today patient appears to be doing well with regard to his wound. He is making good progress and overall very pleased with where things stand today. There is no signs of active infection at this time which is great news. 12/28/2020 upon evaluation today patient appears to be doing well with regard to his wounds. He has been tolerating the dressing changes without complication. Fortunately there is no signs of active infection at this time. No fevers, chills, nausea, vomiting, or diarrhea. 12/28/2020 upon evaluation today patient's wound bed actually showed signs of excellent improvement. He has great epithelization and granulation I do not see any signs of infection overall I am extremely pleased with where things stand at this point. No fevers, chills, nausea, vomiting, or diarrhea. 01/11/2021 upon evaluation today patient appears to be doing  well with regard to his wound on his leg. He has been tolerating the dressing changes without LAN, MCNEILL ([id number]) [id number] Page 9 of 12 complication. Fortunately there does not appear to be any signs of active infection which is great news. No fevers, chills, nausea, vomiting, or diarrhea. 01/25/2021 upon evaluation today patient appears to be doing well with regard to his wound. He has been tolerating the dressing changes without complication. Fortunately the collagen seems to be doing a great job which is excellent news. No fevers, chills, nausea, vomiting, or diarrhea. 02/08/2021 upon evaluation today patient's wound is actually looking a little bit worse especially in the periwound compared to previous. Fortunately there does not appear to be any signs of infection which is great news with that being said he does have some irritation around the periphery of the wound which has me more concerned. He actually had a dressing on that had not been changed in 3 days. He also is supposed to have daily dressing changes. With regard to the dressing applied he had a silver alginate dressing and silver collagen is  what is recommended and ordered. He also had no Desitin around the edges of the wound in the periwound region although that is on the order inspect to be done as well. In general I was very concerned I did contact [facility] actually spoke with Magda Paganini who is the scheduling individual and subsequently she stated that she would pass the information to the D ON apparently the D ON was not available to talk to me when I call today. 02/18/2021 upon evaluation today patient's wound is actually showing signs of improvement. Fortunately there does not appear to be any evidence of infection which is great news overall I am extremely pleased with where things stand today. No fevers, chills, nausea, vomiting, or diarrhea. 8/3; patient presents for 1 week follow-up.  He has no issues or complaints today. He denies signs of infection. 03/11/2021 upon evaluation today patient appears to be doing well with regard to his wound. He does have a little bit of slough noted on the surface of the wound but fortunately there does not appear to be any signs of active infection at this time. No fevers, chills, nausea, vomiting, or diarrhea. 03/18/2021 upon evaluation today patient appears to be doing well with regard to his wound. He has been tolerating the dressing changes without complication. There was a little irritation more proximal to where the wound was that was not noted last week but nonetheless this is very superficial just seems to be more irritation we just need to make sure to put a good amount of the zinc over the area in my opinion. Otherwise he does not seem to be doing significantly worse at all which is great news. 03/25/2021 upon evaluation today patient appears to be doing well with regard to his wound. He is going require some sharp debridement today to clear with some of the necrotic debris. I did perform this today without complication postdebridement wound bed appears to be doing much better this is great news. 04/08/2021 upon evaluation today patient appears to be doing decently well in regard to his wound although the overall measurement is not significantly smaller compared to previous. It is gone down a little bit but still the facility continues to not really put the appropriate dressings in place in fact he was supposed to have collagen we think he probably had more of an allergy to At this point. Fortunately there does not appear to be any signs of active infection systemically though locally I do not see anything on initial visualization either as far as erythema or warmth. 04/15/2021 upon evaluation today patient appears to be doing well with regard to his wound. He is actually showing signs of improvement. I did place him on antibiotics last week,  Cipro. He has been taking that 2 times a day and seems to be tolerating it very well. I do not see any evidence of worsening and in fact the overall appearance of the wound is smaller today which is also great news. 9/26; left medial ankle chronic venous insufficiency wound is improved. Using Hydrofera Blue 10/10; left medial ankle chronic venous insufficiency. Wound has not changed much in appearance completely nonviable surface. Apparently there have been problems getting the right product on the wound at the facility although he came in with [facility] on today 05/14/2021 upon evaluation today patient appears to be doing well with regard to his wound. I think he is making progress here which is good news. Fortunately there does not appear to be any  signs of active infection at this time. No fevers, chills, nausea, vomiting, or diarrhea. 05/20/2021 upon evaluation today patient appears to be doing well with regard to his wound. He is showing signs of good improvement which is great news. There does not appear to be any evidence of active infection which is also excellent news. No fevers, chills, nausea, vomiting, or diarrhea. 05/28/2021 upon evaluation today patient appears to be doing quite well. There does not appear to be any signs of active infection at this time which is great news. Overall I am extremely pleased with where things stand today. I think he is headed in the right direction. 06/11/2021 upon evaluation today patient appears to be doing well with regard to his left ankle ulcer and poorly in regard to the toe ulcer on the second toe right foot. This appears to show signs of joint exposure. Apparently this has been present for 1 to 2 months although he kept forgetting to tell me about it. That is unfortunate as right now it definitely appears to be doing significantly worse than what I would like to see. There does not appear to be any signs of active infection systemically though  locally I am concerned about the possibility of infection the toe is quite red. Again no one from the facility ever contacted [place] to advise that this was going on in the interim either. 06/17/2021 upon evaluation today patient presents for follow-up I did review his x-ray which showed a navicular bone fracture I am unsure of the chronicity of this. Subsequently he also had osteomyelitis of the toe which was what I was more concerned about this did not show up on x-ray but did show up on the pathology scrapings. This was listed as acute osteomyelitis. Nonetheless at this point I think that the antibiotic treatment is the best regimen to go with currently. The patient is in agreement with that plan. Nonetheless he has initially 30 days of doxycycline off likely extend that towards the end of the treatment cycle that will be around the middle of December for an additional 2 weeks. That all depends on how well he continues to heal. Nonetheless based on what I am seeing in the foot I did want a proceed with an MRI as well which I think will be helpful to identify if there is anything else that needs to be addressed from the standpoint of infection. 06/24/2021 upon evaluation today patient appears to be doing pretty well in regards to his wounds. I think both are actually showing signs of improvement which is good I did review his MRI today which did show signs of osteomyelitis of the middle and proximal phalanx on his right foot of the affected toe. With that being said this is actually showing signs of significant improvement today already with the antibiotic therapy I think the redness is also improved. Overall I think that we just need to give this some time with appropriate wound care we will see how things go potentially hyperbarics could be considered. 07/02/2021 upon inspection today patient actually appears to be doing well in regard to his left ankle which is getting very close to complete resolution  of pleased in that regard. Unfortunately he is continuing to have issues with his second toe right foot and this seems to still be very painful for him. Recommend he try something different from the standpoint of antibiotics. 07/15/2021 upon evaluation today patient appears to be doing actually pretty well in regard to his foot.  This is actually showing signs of significant improvement which is great news. Overall I feel like the patient is improving both in regard to the second toe as well as the ankle on the left. With that being said the biggest issue that I do see currently is that he is needing to have a refill of the doxycycline that we previously treated him with. He also did see podiatry they are not going to recommend any amputation at this point since he seems to be doing quite well. For that reason we just need to keep things under control from an infection standpoint. 08/01/2021 upon evaluation today patient appears to be doing well with regard to his wound. He has been tolerating the dressing changes without complication. Fortunately there does not appear to be any evidence of active infection locally nor systemically at this point. In fact I think everything is doing excellent in fact his second toe on the right foot is almost healed and the ankle on the left ankle region is actually very close to being healed as well. 08/08/2021 upon evaluation today patient appears to be doing well with regard to his wound. He has been tolerating the dressing changes without complication. Fortunately I do not see any signs of active infection at this time. Readmission: 12-06-2021 upon evaluation today patient presents for reevaluation here in the clinic he does tell me that he was being seen in facility at [place] by a provider that was coming in. He is not sure who this was. He tells me however that the wound seems to have gotten worse even compared to where it was when we last saw him at  this point. With that being said I do believe that he is likely going need ongoing wound care here in the clinic and I do believe that [facility], [name] ([id number]) [id number] Page 10 of 12 we need to be the ones to frontline this since his wound does seem to be getting worse not better at this point. He voiced understanding. He is also in agreement with this plan and feels more comfortable coming here she tells me. Patient's medical history really has not changed since his prior admission he was only gone since January. 12-27-2021 upon evaluation today patient appears to be doing well with regard to his wound they did run out of the [facility] so they did not put anything on just an ABD pad with gentamicin. Still we are seeing some signs of good improvement here with some new epithelization which is great news. 01-10-2022 upon evaluation today patient appears to be doing well with regard to his wounds and he is going require some sharp debridement but overall seems to be making good progress. Fortunately I do not see any evidence of active infection locally or systemically at this time which is great news. 01-24-2022 upon evaluation today patient appears to be doing well with regard to his wound. The facility actually came and dropped him off early and he had another appointment at the hospital and then they just brought him over here and this was still hours before his appointment this afternoon. For that reason we did do our best to work him in this morning and fortunately had some space to make this happen. With that being said patient's wound does seem to be making progress here and I am very pleased in that regard I do not see any signs of active infection locally or systemically at this time. 02-07-2022 patient appears to be  doing well currently in regard to his wounds. In fact one of them the more proximal is healed the distal is still open but seems to be doing  excellent. Fortunately I do not see any evidence of active infection locally or systemically at this time which is great news. No fevers, chills, nausea, vomiting, or diarrhea. 7/27; left medial ankle venous. Improving per our intake nurse. We are using Hydrofera Blue under Tubigrip compression. They are changing that at his facility 03-06-2022 upon evaluation today patient appears to be doing well with regard to the his wound he is can require some sharp debridement but seems to be making excellent progress. Fortunately I do not see any evidence of active infection locally or systemically which is great news. 03-27-2022 upon evaluation today patient's wound is actually showing signs of excellent improvement. Fortunately I see no signs of active infection locally or systemically at this time which is great news. No fevers, chills, nausea, vomiting, or diarrhea. 04-21-2022 upon evaluation today patient appears to be doing excellent in regard to his wound in fact this is very close to resolution based on what I am seeing. I do not see any evidence of active infection locally or systemically at this time which is great news and overall I am extremely pleased with where we are today. 05-05-2022 upon evaluation today patient's wound actually appears to potentially be completely healed. Fortunately I do not see any evidence of active infection at this time which is great news and overall I am very pleased I think this needs a little time to toughen up but other than that I really do believe were doing quite well. He is very pleased to hear this its been a long time coming. 05-19-2022 upon evaluation today patient appears to be doing well currently in regard to his wound. He in fact we were hoping will be completely healed and closed but it still has a very tiny area right in the center which is still continuing to drain. Fortunately I do not see any signs of infection locally or systemically which is great  news. 11/6; this wound is close to completely" he comes in this week with some erythema at 1 edge of this which looks like something was rubbing on the area. Other than that no evidence of infection 06-16-2022 upon evaluation today patient appears to be doing well currently in regard to his ankle ulcer. This is very close to being healed but still has a small area in the central portion which is not. Fortunately there does not appear to be any signs of infection locally or systemically which is great news. No fevers, chills, nausea, vomiting, or diarrhea. 06-30-2022 upon evaluation patient's wound pretty much appears to be almost completely closed. In fact it may even be closed but there are still a lot of skin irritation around the edges of the wound and I just do not feel great about releasing them yet I would like to continue to monitor this just a little bit longer before getting to that point. He is not opposed to this and in fact is happy to continue to come in if need be in order to make sure that things are moving in the right direction he definitely does not want to backtrack. 07-14-2022 upon evaluation today patient appears to be doing well currently in regard to his wound. Has been tolerating the dressing changes without complication. Fortunately I see no evidence of active infection locally nor systemically which is great news.  No fevers, chills, nausea, vomiting, or diarrhea. 08-11-2022 upon evaluation today patient appears to be doing a little worse than last time I saw him although it has been a month. Unfortunately he was not able to be seen due to the fact that he was quarantined in the facility with COVID. Subsequently he tells me that he mention to the facility staff several times about taking it off over the past several weeks but they continue to tell him that someone have to get in touch with [place] here although nobody ever did until Friday when the nurse manager called to have that  documented in the communication notes. With that being said unfortunately this means that he had a wrap on that he was supposed to have on for only 1 week for ended up being on four 1 month essentially. I am glad that there is nothing worse than what we see currently to be perfectly honest. 08-19-2022 upon evaluation today patient appears to be doing well currently in regard to his wounds. He has been tolerating the dressing changes without complication. This is actually smaller today which is great news after last week's reopening. Fortunately I do not see any evidence of infection locally nor systemically at this point. 08-26-2022 upon evaluation today patient appears to be doing well currently in regard to his wound. He has been tolerating the dressing changes without complication. Fortunately there does not appear to be any signs of active infection locally nor systemically which is great news. No fevers, chills, nausea, vomiting, or diarrhea. Objective Constitutional Well-nourished and well-hydrated in no acute distress. Vitals Time Taken: 9:50 AM, Height: 69 in, Weight: 150 lbs, BMI: 22.1, Temperature: 98.0 F, Pulse: 87 bpm, Respiratory Rate: 18 breaths/min, Blood Pressure: 130/81 mmHg. Respiratory normal breathing without difficulty. Psychiatric Chad [name], Chad [name] ([id number]) [id number] Page 11 of 12 this patient is able to make decisions and demonstrates good insight into disease process. Alert and Oriented x 3. pleasant and cooperative. General Notes: Upon inspection patient's wound bed actually showed signs of good granulation and epithelization at this point. Fortunately I do not see any signs of infection he is going require some debridement in regard to the ankle top of the foot looks to be doing well. I did perform this debridement he tolerated that today without complication. Postdebridement the wound bed is significantly improved. Integumentary (Hair,  Skin) Wound #12 status is Open. Original cause of wound was Gradually Appeared. The date acquired was: 07/12/2019. The wound has been in treatment 37 weeks. The wound is located on the Left,Distal,Medial Ankle. The wound measures 2.3cm length x 0.7cm width x 0.2cm depth; 1.264cm^2 area and 0.253cm^3 volume. There is Fat Layer (Subcutaneous Tissue) exposed. There is a none present amount of drainage noted. The wound margin is distinct with the outline attached to the wound base. There is no granulation within the wound bed. There is no necrotic tissue within the wound bed. Wound #13 status is Open. Original cause of wound was Pressure Injury. The date acquired was: 08/11/2022. The wound has been in treatment 2 weeks. The wound is located on the Left,Dorsal Foot. The wound measures 0.4cm length x 0.4cm width x 0.1cm depth; 0.126cm^2 area and 0.013cm^3 volume. There is Fat Layer (Subcutaneous Tissue) exposed. There is a medium amount of serosanguineous drainage noted. The wound margin is flat and intact. There is large (67-100%) red granulation within the wound bed. There is a small (1-33%) amount of necrotic tissue within the wound bed including Adherent Slough. 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 There was a Excisional Skin/Subcutaneous Tissue Debridement with a total area of 1.61 sq cm performed by Tommie Sams., PA-C. With the following instrument(s): Curette to remove Viable and Non-Viable tissue/material.  Material removed includes Subcutaneous Tissue, Slough, and Biofilm. No specimens were taken. A time out was conducted at 10:08, prior to the start of the procedure. A Minimum amount of bleeding was controlled with Pressure. The procedure was tolerated well. Post Debridement Measurements: 2.3cm length x 0.7cm width x 0.3cm depth; 0.379cm^3 volume. Character of Wound/Ulcer Post Debridement is stable. Severity of Tissue Post Debridement is: Fat layer exposed. Post procedure Diagnosis Wound #12: Same as Pre-Procedure Plan Follow-up Appointments: Return Appointment in 1 week. Bathing/ Shower/ Hygiene: May shower with wound dressing protected with water repellent cover or cast protector. No tub bath. Anesthetic (Use 'Patient Medications' Section for Anesthetic Order Entry): Lidocaine applied to wound bed Edema Control - Lymphedema / Segmental Compressive Device / Other: Tubigrip single layer applied. - Tubi D single layer Elevate legs to the level of the heart and pump ankles as often as possible Elevate leg(s) parallel to the floor when sitting. Medications-Please add to medication list.: Other: - apply AandD ointment to leg, do not apply to wound WOUND #12: - Ankle Wound Laterality: Left, Medial, Distal Cleanser: Soap and Water Every Other Day/30 Days Discharge Instructions: Gently cleanse wound with antibacterial soap, rinse and pat dry prior to dressing wounds Cleanser: Wound Cleanser Every Other Day/30 Days Discharge Instructions: Wash your hands with soap and water. Remove old dressing, discard into plastic bag and place into trash. Cleanse the wound with Wound Cleanser prior to applying a clean dressing using gauze sponges, not tissues or cotton balls. Do not scrub or use excessive force. Pat dry using gauze sponges, not tissue or cotton balls. Peri-Wound Care: AandD Ointment Every Other Day/30 Days Discharge Instructions: Apply AandD Ointment to areas of redness Prim Dressing: Hydrofera  Blue Ready Transfer Foam, 4x5 (in/in) Every Other Day/30 Days ary Discharge Instructions: Apply Hydrofera Blue Ready to wound bed as directed Secondary Dressing: ABD Pad 5x9 (in/in) Every Other Day/30 Days Discharge Instructions: Cover with ABD pad Secured With: Kerlix Roll Sterile or Non-Sterile 6-ply 4.5x4 (yd/yd) Every Other Day/30 Days Discharge Instructions: Apply Kerlix as directed 1. I am going to recommend that we have the patient continue to monitor for any signs of infection or worsening. Based on what I am seeing I do believe that the patient is making excellent headway here. LAVERT, Chad [name] ([id number]) [id number] Page 12 of 12 2. I am also can recommend that the patient should continue with the AandD ointment to the area surrounding the wound followed by the [facility]. 3. Will get a continue with the roll gauze to secure in place and then Tubigrip single-layer size D. We will see patient back for reevaluation in 1 week here in the clinic. If anything worsens or changes patient will contact our office for additional recommendations. Electronic Signature(s) Signed: 08/26/2022 10:17:27 AM By: Worthy Keeler PA-C Entered By: Worthy Keeler on 08/26/2022 10:17:27 -------------------------------------------------------------------------------- SuperBill Details Patient  Name: Date of Service: VASHON, Chad [name] 08/26/2022 Medical Record Number: [id number] Patient Account Number: [id number] Date of Birth/Sex: Treating RN: [birth year] (62 y.o. [place] Meth Primary Care Provider: [name] Other Clinician: Referring Provider: Treating Provider/Extender: [name] in Treatment: 37 Diagnosis Coding ICD-10 Codes Code Description [phone] Chronic venous hypertension (idiopathic) with ulcer and inflammation of left lower extremity L97.322 Non-pressure chronic ulcer of left ankle with fat layer exposed L98.492 Non-pressure  chronic ulcer of skin of other sites with fat layer exposed 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 side Facility Procedures : CPT4 Code: [id number] Description: [facility] - DEB SUBQ TISSUE 20 SQ CM/< ICD-10 Diagnosis Description L97.322 Non-pressure chronic ulcer of left ankle with fat layer exposed Modifier: Quantity: 1 Physician Procedures : CPT4 Code Description Modifier [id number] - WC PHYS SUBQ TISS 20 SQ CM ICD-10 Diagnosis Description Z85.885 Non-pressure chronic ulcer of left ankle with fat layer exposed Quantity: 1 Electronic Signature(s) Signed: 08/26/2022 10:29:55 AM By: Worthy Keeler PA-C Entered By: Worthy Keeler on 08/26/2022 10:29:54

## 2022-09-01 ENCOUNTER — Inpatient Hospital Stay: Payer: Medicaid Other | Attending: Internal Medicine | Admitting: Hospice and Palliative Medicine

## 2022-09-01 DIAGNOSIS — D509 Iron deficiency anemia, unspecified: Secondary | ICD-10-CM | POA: Insufficient documentation

## 2022-09-01 DIAGNOSIS — Z806 Family history of leukemia: Secondary | ICD-10-CM | POA: Insufficient documentation

## 2022-09-01 DIAGNOSIS — Z5112 Encounter for antineoplastic immunotherapy: Secondary | ICD-10-CM | POA: Insufficient documentation

## 2022-09-01 DIAGNOSIS — C187 Malignant neoplasm of sigmoid colon: Secondary | ICD-10-CM | POA: Insufficient documentation

## 2022-09-01 DIAGNOSIS — Z515 Encounter for palliative care: Secondary | ICD-10-CM

## 2022-09-01 DIAGNOSIS — C661 Malignant neoplasm of right ureter: Secondary | ICD-10-CM | POA: Insufficient documentation

## 2022-09-01 DIAGNOSIS — Z8546 Personal history of malignant neoplasm of prostate: Secondary | ICD-10-CM | POA: Insufficient documentation

## 2022-09-01 DIAGNOSIS — J439 Emphysema, unspecified: Secondary | ICD-10-CM | POA: Insufficient documentation

## 2022-09-01 DIAGNOSIS — F17210 Nicotine dependence, cigarettes, uncomplicated: Secondary | ICD-10-CM | POA: Insufficient documentation

## 2022-09-01 DIAGNOSIS — Z79899 Other long term (current) drug therapy: Secondary | ICD-10-CM | POA: Insufficient documentation

## 2022-09-01 DIAGNOSIS — Z800 Family history of malignant neoplasm of digestive organs: Secondary | ICD-10-CM | POA: Insufficient documentation

## 2022-09-01 DIAGNOSIS — Z8051 Family history of malignant neoplasm of kidney: Secondary | ICD-10-CM | POA: Insufficient documentation

## 2022-09-01 DIAGNOSIS — Z8042 Family history of malignant neoplasm of prostate: Secondary | ICD-10-CM | POA: Insufficient documentation

## 2022-09-01 NOTE — Progress Notes (Signed)
Unable to reach patient.  Will reschedule.  Appreciate community palliative care following him at [facility].

## 2022-09-02 ENCOUNTER — Encounter: Payer: Medicaid Other | Attending: Physician Assistant | Admitting: Physician Assistant

## 2022-09-02 DIAGNOSIS — M1990 Unspecified osteoarthritis, unspecified site: Secondary | ICD-10-CM | POA: Diagnosis not present

## 2022-09-02 DIAGNOSIS — I2510 Atherosclerotic heart disease of native coronary artery without angina pectoris: Secondary | ICD-10-CM | POA: Insufficient documentation

## 2022-09-02 DIAGNOSIS — L98492 Non-pressure chronic ulcer of skin of other sites with fat layer exposed: Secondary | ICD-10-CM | POA: Insufficient documentation

## 2022-09-02 DIAGNOSIS — I739 Peripheral vascular disease, unspecified: Secondary | ICD-10-CM | POA: Insufficient documentation

## 2022-09-02 DIAGNOSIS — L97322 Non-pressure chronic ulcer of left ankle with fat layer exposed: Secondary | ICD-10-CM | POA: Insufficient documentation

## 2022-09-02 DIAGNOSIS — I69354 Hemiplegia and hemiparesis following cerebral infarction affecting left non-dominant side: Secondary | ICD-10-CM | POA: Insufficient documentation

## 2022-09-02 DIAGNOSIS — I872 Venous insufficiency (chronic) (peripheral): Secondary | ICD-10-CM | POA: Insufficient documentation

## 2022-09-02 NOTE — Progress Notes (Signed)
Chad, [name] ([id number]) [id number] Page 1 of 4 Visit Report for 09/02/2022 Chief Complaint Document Details Patient Name: Date of Service: Chad [name], Chad [name] 09/02/2022 9:30 A M Medical Record Number: [id number] Patient Account Number: [id number] Date of Birth/Sex: Treating RN: 16-Jul-1961 (62 y.o. Seward Meth Primary Care Provider: [name] Other Clinician: Referring Provider: Treating Provider/Extender: [name] in Treatment: 38 Information Obtained from: Patient Chief Complaint Left ankle ulcer Electronic Signature(s) Signed: 09/02/2022 9:35:54 AM By: Worthy Keeler PA-C Entered By: Worthy Keeler on 09/02/2022 09:35:54 -------------------------------------------------------------------------------- Debridement Details Patient Name: Date of Service: Chad, [name] 09/02/2022 9:30 A M Medical Record Number: [id number] Patient Account Number: [id number] Date of Birth/Sex: Treating RN: 1960-08-16 (62 y.o. Seward Meth Primary Care Provider: [name] Other Clinician: Referring Provider: Treating Provider/Extender: [name] in Treatment: 38 Debridement Performed for Assessment: Wound #12 Left,Distal,Medial Ankle Performed By: Physician Tommie Sams., PA-C Debridement Type: Debridement Severity of Tissue Pre Debridement: Fat layer exposed Level of Consciousness (Pre-procedure): Awake and Alert Pre-procedure Verification/Time Out Yes - 09:48 Taken: Start Time: 09:48 Pain Control: Lidocaine 4% T opical Solution T Area Debrided (L x W): otal 2.3 (cm) x 0.5 (cm) = 1.15 (cm) Tissue and other material debrided: Viable, Non-Viable, Slough, Subcutaneous, Slough Level: Skin/Subcutaneous Tissue Debridement Description: Excisional Instrument: Curette Bleeding: Minimum Hemostasis Achieved: Pressure Response to Treatment: Procedure was tolerated well Level of Consciousness (Post- Awake and  Alert procedure): ISAID, SALVIA ([id number]) [id number] Page 2 of 4 Post Debridement Measurements of Total Wound Length: (cm) 2.3 Width: (cm) 0.5 Depth: (cm) 0.3 Volume: (cm) 0.271 Character of Wound/Ulcer Post Debridement: Stable Severity of Tissue Post Debridement: Fat layer exposed Post Procedure Diagnosis Same as Pre-procedure Electronic Signature(s) Unsigned Entered By: Rosalio Loud on 09/02/2022 09:51:43 -------------------------------------------------------------------------------- Physician Orders Details Patient Name: Date of Service: Chad, [name] 09/02/2022 9:30 A M Medical Record Number: [id number] Patient Account Number: [id number] Date of Birth/Sex: Treating RN: 1961/07/03 (62 y.o. 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 Bathing/ Shower/ Hygiene May shower with wound dressing protected with water repellent cover or cast protector. No tub bath. Anesthetic (Use 'Patient Medications' Section for Anesthetic Order Entry) Lidocaine applied to wound bed Edema Control - Lymphedema / Segmental Compressive Device / Other Tubigrip single layer applied. - Tubi D single layer Elevate legs to the level of the heart and pump ankles as often as possible Elevate leg(s) parallel to the floor when sitting. Medications-Please add to medication  list. Other: - apply AandD ointment to leg, do not apply to wound Wound Treatment Wound #12 - Ankle Wound Laterality: Left, Medial, Distal Cleanser: Soap and Water Every Other Day/30 Days Discharge Instructions: Gently cleanse wound with antibacterial soap, rinse and pat dry prior to dressing wounds Chad [name] ([id number]) [id number] Page 3 of 4 Cleanser: Wound Cleanser Every Other Day/30 Days Discharge Instructions: Wash your hands with soap and water. Remove old dressing, discard into plastic bag and place into trash. Cleanse the wound with Wound Cleanser prior to applying a  clean dressing using gauze sponges, not tissues or cotton balls. Do not scrub or use excessive force. Pat dry using gauze sponges, not tissue or cotton balls. Peri-Wound Care: AandD Ointment Every Other Day/30 Days Discharge Instructions: Apply AandD Ointment to areas of redness Prim Dressing: Hydrofera Blue Ready Transfer Foam, 4x5 (in/in) Every Other Day/30 Days ary Discharge Instructions: Apply Hydrofera Blue Ready to wound bed as directed Secondary Dressing: ABD Pad 5x9 (in/in) Every Other Day/30 Days Discharge Instructions: Cover with ABD pad Secured With: Kerlix Roll Sterile or Non-Sterile 6-ply 4.5x4 (yd/yd) Every Other Day/30 Days Discharge Instructions: Apply Kerlix as directed Wound #13 - Foot Wound Laterality: Dorsal, Left Cleanser: Soap and Water Every Other Day/30 Days Discharge Instructions: Gently cleanse wound with antibacterial soap, rinse and pat dry prior to dressing wounds Cleanser: Wound Cleanser Every Other Day/30 Days Discharge Instructions: Wash your hands with soap and water. Remove old dressing, discard into plastic bag and place into trash. Cleanse the wound with Wound Cleanser prior to applying a clean dressing using gauze sponges, not tissues or cotton balls. Do not scrub or use excessive force. Pat dry using gauze sponges, not tissue or cotton  balls. Peri-Wound Care: AandD Ointment Every Other Day/30 Days Discharge Instructions: Apply AandD Ointment to areas of redness Prim Dressing: Hydrofera Blue Ready Transfer Foam, 4x5 (in/in) Every Other Day/30 Days ary Discharge Instructions: Apply Hydrofera Blue Ready to wound bed as directed Secondary Dressing: ABD Pad 5x9 (in/in) Every Other Day/30 Days Discharge Instructions: Cover with ABD pad Secured With: Kerlix Roll Sterile or Non-Sterile 6-ply 4.5x4 (yd/yd) Every Other Day/30 Days Discharge Instructions: Apply Kerlix as directed Wound #14 - Lower Leg Wound Laterality: Left, Midline, Proximal Cleanser: Soap and Water Every Other Day/30 Days Discharge Instructions: Gently cleanse wound with antibacterial soap, rinse and pat dry prior to dressing wounds Cleanser: Wound Cleanser Every Other Day/30 Days Discharge Instructions: Wash your hands with soap and water. Remove old dressing, discard into plastic bag and place into trash. Cleanse the wound with Wound Cleanser prior to applying a clean dressing using gauze sponges, not tissues or cotton balls. Do not scrub or use excessive force. Pat dry using gauze sponges, not tissue or cotton balls. Peri-Wound Care: AandD Ointment Every Other Day/30 Days Discharge Instructions: Apply AandD Ointment to areas of redness Prim Dressing: Hydrofera Blue Ready Transfer Foam, 4x5 (in/in) Every Other Day/30 Days ary Discharge Instructions: Apply Hydrofera Blue Ready to wound bed as directed Secondary Dressing: ABD Pad 5x9 (in/in) Every Other Day/30 Days Discharge Instructions: Cover with ABD pad Secured With: Kerlix Roll Sterile or Non-Sterile 6-ply 4.5x4 (yd/yd) Every Other Day/30 Days Discharge Instructions: Apply Kerlix as directed Electronic Signature(s) Unsigned Entered By: Rosalio Loud on 09/02/2022 09:52:27 Signature(s): Chad [name] ([id number]) [id number] Date(s): [id number] Page 4 of  4 -------------------------------------------------------------------------------- Problem List Details Patient Name: Date of Service: CELIA, GIBBONS 09/02/2022 9:30 A M Medical Record Number: [id number] Patient Account Number: [id number] Date of Birth/Sex: Treating RN: [birth year] (62 y.o. 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

## 2022-09-03 NOTE — Progress Notes (Signed)
Chad [name], Chad [name] ([id number]) [id number] Page 1 of 11 Visit Report for 09/02/2022 Arrival Information Details Patient Name: Date of Service: Chad [name], Chad [name] 09/02/2022 9:30 A M Medical Record Number: [id number] Patient Account Number: [id number] Date of Birth/Sex: Treating RN: [birth year] (62 y.o. Seward Meth Primary Care Alphonsus Doyel: [name] Other Clinician: Referring Iveliz Garay: Treating Sanika Brosious/Extender: [name] in Treatment: 38 Visit Information History Since Last Visit Added or deleted any medications: No Patient Arrived: Wheel Chair Any new allergies or adverse reactions: No Arrival Time: 09:19 Had a fall or experienced change in No Accompanied By: self activities of daily living that may affect Transfer Assistance: None risk of falls: Patient Identification Verified: Yes Hospitalized since last visit: No Secondary Verification Process Completed: Yes Has Dressing in Place as Prescribed: Yes Patient Requires Transmission-Based No Pain Present Now: No Precautions: Patient Has Alerts: Yes Patient Alerts: Patient on Blood Thinner NOT diabetic aspirin '81mg'$  Lives [facility] Electronic Signature(s) Signed: 09/02/2022 9:41:58 AM By: Rosalio Loud MSN RN CNS WTA Entered By: Rosalio Loud on 09/02/2022 09:41:57 -------------------------------------------------------------------------------- Clinic Level of Care Assessment Details Patient Name: Date of Service: Chad [name], Chad [name] 09/02/2022 9:30 A M Medical Record Number: [id number] Patient Account Number: [id number] Date of Birth/Sex: Treating RN: 12-18-1960 (62 y.o. Seward Meth Primary Care Kasheem Toner: [name] Other Clinician: Referring Jayke Caul: Treating Revis Whalin/Extender: [name] in Treatment: 38 Clinic Level of Care Assessment Items TOOL 1 Quantity Score '[]'$  - 0 Use when EandM and Procedure is performed on INITIAL visit ASSESSMENTS -  Nursing Assessment / Reassessment '[]'$  - 0 General Physical Exam (combine w/ comprehensive assessment (listed just below) when performed on new pt. evals) '[]'$  - 0 Comprehensive Assessment (HX, ROS, Risk Assessments, Wounds Hx, etc.) JAVI, BOLLMAN ([id number]) [phone] Page 2 of 11 ASSESSMENTS - Wound and Skin Assessment / Reassessment '[]'$  - 0 Dermatologic / Skin Assessment (not related to wound area) ASSESSMENTS - Ostomy and/or Continence Assessment and Care '[]'$  - 0 Incontinence Assessment and Management '[]'$  - 0 Ostomy Care Assessment and Management (repouching, etc.) PROCESS - Coordination of Care '[]'$  - 0 Simple Patient / Family Education for ongoing care '[]'$  - 0 Complex (extensive) Patient / Family Education for ongoing care '[]'$  - 0 Staff obtains Programmer, systems, Records, T Results / Process Orders est '[]'$  - 0 Staff telephones HHA, Nursing Homes / Clarify orders / etc '[]'$  - 0 Routine Transfer to another Facility (non-emergent condition) '[]'$  - 0 Routine Hospital Admission (non-emergent condition) '[]'$  - 0 New Admissions / Biomedical engineer / Ordering NPWT Apligraf, etc. , '[]'$  - 0 Emergency Hospital Admission (emergent condition) PROCESS - Special Needs '[]'$  - 0 Pediatric / Minor Patient Management '[]'$  - 0 Isolation Patient Management '[]'$  - 0 Hearing / Language / Visual special needs '[]'$  - 0 Assessment of Community assistance (transportation, D/C planning, etc.) '[]'$  - 0 Additional assistance / Altered mentation '[]'$  - 0 Support Surface(s) Assessment (bed, cushion, seat, etc.) INTERVENTIONS - Miscellaneous '[]'$  - 0 External ear exam '[]'$  - 0 Patient Transfer (multiple staff / Civil Service fast streamer / Similar devices) '[]'$  - 0 Simple Staple / Suture removal (25 or less) '[]'$  - 0 Complex Staple / Suture removal (26 or more) '[]'$  - 0 Hypo/Hyperglycemic Management (do not check if billed separately) '[]'$  - 0 Ankle / Brachial Index (ABI) - do not check if billed separately Has  the patient been seen at the hospital within the last three years: Yes Total Score: 0 Level Of Care: ____ Electronic Signature(s) Signed:  09/03/2022 10:28:52 AM By: Rosalio Loud MSN RN CNS WTA Entered By: Rosalio Loud on 09/02/2022 09:52:34 -------------------------------------------------------------------------------- Encounter Discharge Information Details Patient Name: Date of Service: Chad [name], Chad [name] 09/02/2022 9:30 A M Medical Record Number: [id number] Patient Account Number: [id number] Date of Birth/Sex: Treating RN: 08-05-1960 (62 y.o. Seward Meth Primary Care Jacquez Sheetz: [name] Other Clinician: Referring Alegria Dominique: Treating Dana Dorner/Extender: [name] Celina, [name] ([id number]) [id number] Page 3 of 11 Weeks in Treatment: 64 Encounter Discharge Information Items Post Procedure Vitals Discharge Condition: Stable Temperature (F): 97.8 Ambulatory Status: Wheelchair Pulse (bpm): 90 Discharge Destination: Home Respiratory Rate (breaths/min): 16 Transportation: Private Auto Blood Pressure (mmHg): 118/78 Accompanied By: self Schedule Follow-up Appointment: Yes Clinical Summary of Care: Electronic Signature(s) Signed: 09/03/2022 10:28:52 AM By: Rosalio Loud MSN RN CNS WTA Entered By: Rosalio Loud on 09/02/2022 09:55:40 -------------------------------------------------------------------------------- Lower Extremity Assessment Details Patient Name: Date of Service: Chad [name], Chad [name] 09/02/2022 9:30 A M Medical Record Number: [id number] Patient Account Number: [id number] Date of Birth/Sex: Treating RN: 08-27-1960 (62 y.o. Seward Meth Primary Care Janaysha Depaulo: [name] Other Clinician: Referring Amyr Sluder: Treating Eretria Manternach/Extender: [name] in Treatment: 38 Edema Assessment Assessed: [Left: No] [Right: No] [Left: Edema] [Right: :] Calf Left: Right: Point of Measurement: 33 cm From Medial Instep 33  cm Ankle Left: Right: Point of Measurement: 12 cm From Medial Instep 22 cm Vascular Assessment Pulses: Dorsalis Pedis Palpable: [Left:Yes] Electronic Signature(s) Signed: 09/02/2022 9:42:38 AM By: Rosalio Loud MSN RN CNS WTA Entered By: Rosalio Loud on 09/02/2022 09:42:37 [name] ([id number]) [id number] Page 4 of 11 -------------------------------------------------------------------------------- Multi Wound Chart Details Patient Name: Date of Service: Chad [name], Chad [name] 09/02/2022 9:30 A M Medical Record Number: [id number] Patient Account Number: [id number] Date of Birth/Sex: Treating RN: [birth year] (62 y.o. Seward Meth Primary Care Mearl Olver: [name] Other Clinician: Referring David Towson: Treating Ervey Fallin/Extender: [name] in Treatment: 38 Vital Signs Height(in): 50 Pulse(bpm): 86 Weight(lbs): 150 Blood Pressure(mmHg): 118/78 Body Mass Index(BMI): 22.1 Temperature(F): 97.8 Respiratory Rate(breaths/min): 16 [12:Photos:] Left, Distal, Medial Ankle Left, Dorsal Foot Left, Proximal, Midline Lower Leg Wound Location: Gradually Appeared Pressure Injury Gradually Appeared Wounding Event: Venous Leg Ulcer Pressure Ulcer Abrasion Primary Etiology: Anemia, Chronic Obstructive Anemia, Chronic Obstructive Anemia, Chronic Obstructive Comorbid History: Pulmonary Disease (COPD), Coronary Pulmonary Disease (COPD), Coronary Pulmonary Disease (COPD), Coronary Artery Disease, Peripheral Arterial Artery Disease, Peripheral Arterial Artery Disease, Peripheral Arterial Disease, Peripheral Venous Disease, Disease, Peripheral Venous Disease, Disease, Peripheral Venous Disease, Hepatitis C, Osteoarthritis, Hepatitis C, Osteoarthritis, Hepatitis C, Osteoarthritis, Neuropathy, Received Chemotherapy Neuropathy, Received Chemotherapy Neuropathy, Received Chemotherapy 07/12/2019 08/11/2022 09/02/2022 Date Acquired: 38 3 0 Weeks of  Treatment: Open Open Open Wound Status: No No No Wound Recurrence: 2.3x0.5x0.2 0.4x0.3x0.1 1x0.4x0.1 Measurements L x W x D (cm) 0.903 0.094 0.314 A (cm) : rea 0.181 0.009 0.031 Volume (cm) : 86.90% 88.00% N/A % Reduction in Area: 86.90% 88.60% N/A % Reduction in Volume: Full Thickness Without Exposed Category/Stage III Full Thickness Without Exposed Classification: Support Structures Support Structures None Present Medium Medium Exudate Amount: N/A Serosanguineous Sanguinous Exudate Type: N/A red, brown red Exudate Color: Distinct, outline attached Flat and Intact N/A Wound Margin: None Present (0%) Large (67-100%) Medium (34-66%) Granulation Amount: N/A Red Red Granulation Quality: None Present (0%) Small (1-33%) None Present (0%) Necrotic Amount: Fat Layer (Subcutaneous Tissue): Yes Fat Layer (Subcutaneous Tissue): Yes Fat Layer (Subcutaneous Tissue): Yes Exposed Structures: Fascia: No Fascia: No Fascia: No Tendon: No Tendon: No Tendon: No Muscle: No Muscle: No  Muscle: No Joint: No Joint: No Joint: No Bone: No Bone: No Bone: No Large (67-100%) None Medium (34-66%) Epithelialization: Treatment Notes Electronic Signature(s) Signed: 09/03/2022 10:28:52 AM By: Rosalio Loud MSN RN CNS Chad Durie, [name] [id number] By: Rosalio Loud MSN RN CNS WTA [id number].pdf Page 5 of 11 Signed: 09/03/2022 10:28:52 Entered By: Rosalio Loud on 09/02/2022 09:48:03 -------------------------------------------------------------------------------- Multi-Disciplinary Care Plan Details Patient Name: Date of Service: Chad [name], Chad [name] 09/02/2022 9:30 A M Medical Record Number: [id number] Patient Account Number: [id number] Date of Birth/Sex: Treating RN: 1960/10/01 (62 y.o. Seward Meth Primary Care Faithlyn Recktenwald: [name] Other Clinician: Referring Keighley Deckman: Treating Ebrahim Deremer/Extender: [name] in Treatment: 38 Active  Inactive Venous Leg Ulcer Nursing Diagnoses: Knowledge deficit related to disease process and management Goals: Patient will maintain optimal edema control Date Initiated: 12/06/2021 Date Inactivated: 12/27/2021 Target Resolution Date: 01/03/2022 Goal Status: Met Patient/caregiver will verbalize understanding of disease process and disease management Date Initiated: 12/06/2021 Target Resolution Date: 05/29/2022 Goal Status: Active Interventions: Assess peripheral edema status every visit. Compression as ordered Notes: Electronic Signature(s) Signed: 09/03/2022 10:28:52 AM By: Rosalio Loud MSN RN CNS WTA Entered By: Rosalio Loud on 09/02/2022 09:54:35 -------------------------------------------------------------------------------- Pain Assessment Details Patient Name: Date of Service: Chad [name], Chad [name] 09/02/2022 9:30 A M Medical Record Number: [id number] Patient Account Number: [id number] Date of Birth/Sex: Treating RN: 1961/07/07 (62 y.o. Seward Meth Primary Care Evalise Abruzzese: [name] Other Clinician: Referring Ceilidh Torregrossa: Treating Whitten Andreoni/Extender: [name] in Treatment: [address] [place], [name] ([id number]) [id number] Page 6 of 11 Location of Pain Severity and Description of Pain Patient Has Paino No Site Locations Pain Management and Medication Current Pain Management: Electronic Signature(s) Signed: 09/02/2022 9:42:12 AM By: Rosalio Loud MSN RN CNS WTA Entered By: Rosalio Loud on 09/02/2022 09:42:11 -------------------------------------------------------------------------------- Patient/Caregiver Education Details Patient Name: Date of Service: Chad [name], Chad [name] 2/6/2024andnbsp9:30 A M Medical Record Number: [id number] Patient Account Number: [id number] Date of Birth/Gender: Treating RN: October 06, 1960 (62 y.o. Seward Meth Primary Care Physician: [name] Other Clinician: Referring Physician: Treating  Physician/Extender: [name] in Treatment: 72 Education Assessment Education Provided To: Patient Education Topics Provided Wound/Skin Impairment: Handouts: Caring for Your Ulcer Methods: Explain/Verbal Responses: State content correctly Electronic Signature(s) Signed: 09/03/2022 10:28:52 AM By: Rosalio Loud MSN RN CNS WTA Entered By: Rosalio Loud on 09/02/2022 09:54:29 [name] ([id number]) [id number] Page 7 of 11 -------------------------------------------------------------------------------- Wound Assessment Details Patient Name: Date of Service: Chad [name], Chad [name] 09/02/2022 9:30 A M Medical Record Number: [id number] Patient Account Number: [id number] Date of Birth/Sex: Treating RN: 05/03/1961 (62 y.o. Seward Meth Primary Care Michaiah Holsopple: [name] Other Clinician: Referring Quinley Nesler: Treating Kaytlyn Din/Extender: [name] in Treatment: 38 Wound Status Wound Number: 12 Primary Venous Leg Ulcer Etiology: Wound Location: Left, Distal, Medial Ankle Wound Open Wounding Event: Gradually Appeared Status: Date Acquired: 07/12/2019 Comorbid Anemia, Chronic Obstructive Pulmonary Disease (COPD), Coronary Weeks Of Treatment: 38 History: Artery Disease, Peripheral Arterial Disease, Peripheral Venous Clustered Wound: No Disease, Hepatitis C, Osteoarthritis, Neuropathy, Received Chemotherapy Photos Wound Measurements Length: (cm) 2.3 Width: (cm) 0.5 Depth: (cm) 0.2 Area: (cm) 0.903 Volume: (cm) 0.181 % Reduction in Area: 86.9% % Reduction in Volume: 86.9% Epithelialization: Large (67-100%) Wound Description Classification: Full Thickness Without Exposed Support Structures Wound Margin: Distinct, outline attached Exudate Amount: None Present Foul Odor After Cleansing: No Slough/Fibrino No Wound Bed Granulation Amount: None Present (0%) Exposed Structure Necrotic Amount: None Present  (0%) Fascia Exposed: No Fat Layer (Subcutaneous Tissue) Exposed:  Yes Tendon Exposed: No Muscle Exposed: No Joint Exposed: No Bone Exposed: No Treatment Notes Wound #12 (Ankle) Wound Laterality: Left, Medial, Distal Cleanser Soap and Water Discharge Instruction: Gently cleanse wound with antibacterial soap, rinse and pat dry prior to dressing wounds Wound Cleanser [name] ([id number]) [phone].pdf Page 8 of 11 Discharge Instruction: Wash your hands with soap and water. Remove old dressing, discard into plastic bag and place into trash. Cleanse the wound with Wound Cleanser prior to applying a clean dressing using gauze sponges, not tissues or cotton balls. Do not scrub or use excessive force. Pat dry using gauze sponges, not tissue or cotton balls. Peri-Wound Care AandD Ointment Discharge Instruction: Apply AandD Ointment to areas of redness Topical Primary Dressing Hydrofera Blue Ready Transfer Foam, 4x5 (in/in) Discharge Instruction: Apply Hydrofera Blue Ready to wound bed as directed Secondary Dressing ABD Pad 5x9 (in/in) Discharge Instruction: Cover with ABD pad Secured With Kerlix Roll Sterile or Non-Sterile 6-ply 4.5x4 (yd/yd) Discharge Instruction: Apply Kerlix as directed Compression Wrap Compression Stockings Add-Ons Electronic Signature(s) Signed: 09/03/2022 10:28:52 AM By: Rosalio Loud MSN RN CNS WTA Entered By: Rosalio Loud on 09/02/2022 09:39:30 -------------------------------------------------------------------------------- Wound Assessment Details Patient Name: Date of Service: Chad [name] 09/02/2022 9:30 A M Medical Record Number: [id number] Patient Account Number: [id number] Date of Birth/Sex: Treating RN: November 25, 1960 (62 y.o. Seward Meth Primary Care Shahida Schnackenberg: [name] Other Clinician: Referring Sigfredo Schreier: Treating Makana Feigel/Extender: [name] in Treatment: 38 Wound Status Wound Number: 13  Primary Pressure Ulcer Etiology: Wound Location: Left, Dorsal Foot Wound Open Wounding Event: Pressure Injury Status: Date Acquired: 08/11/2022 Comorbid Anemia, Chronic Obstructive Pulmonary Disease (COPD), Coronary Weeks Of Treatment: 3 History: Artery Disease, Peripheral Arterial Disease, Peripheral Venous Clustered Wound: No Disease, Hepatitis C, Osteoarthritis, Neuropathy, Received Chemotherapy Photos KRISTI, HYER ([id number]) [phone].pdf Page 9 of 11 Wound Measurements Length: (cm) 0.4 Width: (cm) 0.3 Depth: (cm) 0.1 Area: (cm) 0.094 Volume: (cm) 0.009 % Reduction in Area: 88% % Reduction in Volume: 88.6% Epithelialization: None Wound Description Classification: Category/Stage III Wound Margin: Flat and Intact Exudate Amount: Medium Exudate Type: Serosanguineous Exudate Color: red, brown Wound Bed Granulation Amount: Large (67-100%) Exposed Structure Granulation Quality: Red Fascia Exposed: No Necrotic Amount: Small (1-33%) Fat Layer (Subcutaneous Tissue) Exposed: Yes Necrotic Quality: Adherent Slough Tendon Exposed: No Muscle Exposed: No Joint Exposed: No Bone Exposed: No Treatment Notes Wound #13 (Foot) Wound Laterality: Dorsal, Left Cleanser Soap and Water Discharge Instruction: Gently cleanse wound with antibacterial soap, rinse and pat dry prior to dressing wounds Wound Cleanser Discharge Instruction: Wash your hands with soap and water. Remove old dressing, discard into plastic bag and place into trash. Cleanse the wound with Wound Cleanser prior to applying a clean dressing using gauze sponges, not tissues or cotton balls. Do not scrub or use excessive force. Pat dry using gauze sponges, not tissue or cotton balls. Peri-Wound Care AandD Ointment Discharge Instruction: Apply AandD Ointment to areas of redness Topical Primary Dressing Hydrofera Blue Ready Transfer Foam, 4x5 (in/in) Discharge Instruction: Apply Hydrofera  Blue Ready to wound bed as directed Secondary Dressing ABD Pad 5x9 (in/in) Discharge Instruction: Cover with ABD pad Secured With Kerlix Roll Sterile or Non-Sterile 6-ply 4.5x4 (yd/yd) Discharge Instruction: Apply Kerlix as directed Compression Wrap Compression Stockings Add-Ons Electronic Signature(s) Signed: 09/03/2022 10:28:52 AM By: Rosalio Loud MSN RN CNS Chad Durie, [name] ([id number]) [id number] Page 10 of 11 Entered By: Rosalio Loud on 09/02/2022 09:39:57 -------------------------------------------------------------------------------- Wound Assessment Details Patient Name: Date of Service: Chad [name], Chad [name]  09/02/2022 9:30 A M Medical Record Number: [id number] Patient Account Number: [id number] Date of Birth/Sex: Treating RN: [birth year] (62 y.o. Seward Meth Primary Care Madellyn Denio: [name] Other Clinician: Referring Noe Pittsley: Treating Lyliana Dicenso/Extender: [name] in Treatment: 38 Wound Status Wound Number: 14 Primary Abrasion Etiology: Wound Location: Left, Proximal, Midline Lower Leg Wound Open Wounding Event: Gradually Appeared Status: Date Acquired: 09/02/2022 Comorbid Anemia, Chronic Obstructive Pulmonary Disease (COPD), Coronary Weeks Of Treatment: 0 History: Artery Disease, Peripheral Arterial Disease, Peripheral Venous Clustered Wound: No Disease, Hepatitis C, Osteoarthritis, Neuropathy, Received Chemotherapy Photos Wound Measurements Length: (cm) 1 Width: (cm) 0.4 Depth: (cm) 0.1 Area: (cm) 0.314 Volume: (cm) 0.031 % Reduction in Area: % Reduction in Volume: Epithelialization: Medium (34-66%) Tunneling: No Undermining: No Wound Description Classification: Full Thickness Without Exposed Support Exudate Amount: Medium Exudate Type: Sanguinous Exudate Color: red Structures Foul Odor After Cleansing: No Slough/Fibrino No Wound Bed Granulation Amount: Medium (34-66%) Exposed Structure Granulation  Quality: Red Fascia Exposed: No Necrotic Amount: None Present (0%) Fat Layer (Subcutaneous Tissue) Exposed: Yes Tendon Exposed: No Muscle Exposed: No Joint Exposed: No Bone Exposed: No Treatment Notes Wound #14 (Lower Leg) Wound Laterality: Left, Midline, Proximal [name] ([id number]) [id number] Page 11 of 11 Cleanser Soap and Water Discharge Instruction: Gently cleanse wound with antibacterial soap, rinse and pat dry prior to dressing wounds Wound Cleanser Discharge Instruction: Wash your hands with soap and water. Remove old dressing, discard into plastic bag and place into trash. Cleanse the wound with Wound Cleanser prior to applying a clean dressing using gauze sponges, not tissues or cotton balls. Do not scrub or use excessive force. Pat dry using gauze sponges, not tissue or cotton balls. Peri-Wound Care AandD Ointment Discharge Instruction: Apply AandD Ointment to areas of redness Topical Primary Dressing Hydrofera Blue Ready Transfer Foam, 4x5 (in/in) Discharge Instruction: Apply Hydrofera Blue Ready to wound bed as directed Secondary Dressing ABD Pad 5x9 (in/in) Discharge Instruction: Cover with ABD pad Secured With Kerlix Roll Sterile or Non-Sterile 6-ply 4.5x4 (yd/yd) Discharge Instruction: Apply Kerlix as directed Compression Wrap Compression Stockings Add-Ons Electronic Signature(s) Signed: 09/03/2022 10:28:52 AM By: Rosalio Loud MSN RN CNS WTA Entered By: Rosalio Loud on 09/02/2022 09:38:32 -------------------------------------------------------------------------------- [place] Details Patient Name: Date of Service: Chad [name] 09/02/2022 9:30 A M Medical Record Number: [id number] Patient Account Number: [id number] Date of Birth/Sex: Treating RN: 09-10-1960 (62 y.o. Seward Meth Primary Care Ozetta Flatley: [name] Other Clinician: Referring Braden Deloach: Treating Nyquan Selbe/Extender: [name] in  Treatment: 38 Vital Signs Time Taken: 09:25 Temperature (F): 97.8 Height (in): 69 Pulse (bpm): 90 Weight (lbs): 150 Respiratory Rate (breaths/min): 16 Body Mass Index (BMI): 22.1 Blood Pressure (mmHg): 118/78 Reference Range: 80 - 120 mg / dl Electronic Signature(s) Signed: 09/02/2022 9:42:05 AM By: Rosalio Loud MSN RN CNS WTA Entered By: Rosalio Loud on 09/02/2022 09:42:04

## 2022-09-09 ENCOUNTER — Encounter: Payer: Medicaid Other | Admitting: Physician Assistant

## 2022-09-09 DIAGNOSIS — L97322 Non-pressure chronic ulcer of left ankle with fat layer exposed: Secondary | ICD-10-CM | POA: Diagnosis not present

## 2022-09-09 NOTE — Progress Notes (Signed)
DRYSTAN, BOUSE ([id number]) [id number] Page 1 of 12 Visit Report for 09/09/2022 Arrival Information Details Patient Name: Date of Service: Chad [name], Chad [name] 09/09/2022 11:15 A M Medical Record Number: [id number] Patient Account Number: [id number] Date of Birth/Sex: Treating RN: [birth year] (62 y.o. [name]) Carlene Coria Primary Care Shatonia Hoots: [name] Other Clinician: Referring Kwana Ringel: Treating Katharin Schneider/Extender: [name] in Treatment: 39 Visit Information History Since Last Visit Added or deleted any medications: No Patient Arrived: Wheel Chair Any new allergies or adverse reactions: No Arrival Time: 11:17 Had a fall or experienced change in No Accompanied By: self activities of daily living that may affect Transfer Assistance: None risk of falls: Patient Identification Verified: Yes Signs or symptoms of abuse/neglect since last visito No Secondary Verification Process Completed: Yes Hospitalized since last visit: No Patient Requires Transmission-Based No Implantable device outside of the clinic excluding No Precautions: cellular tissue based products placed in the center Patient Has Alerts: Yes since last visit: Patient Alerts: Patient on Blood Thinner Has Dressing in Place as Prescribed: Yes NOT diabetic Pain Present Now: No aspirin [facility]) Unsigned Entered By:Carlene Coriaon 09/09/2022 11:18:49 -------------------------------------------------------------------------------- Clinic Level of Care Assessment Details Patient Name: Date of Service: LRULON, FREITAG2/13/2024 11:15 A M Medical Record Number: [id number] Account Number: [id number] of Birth/Sex: Treating RN: [birth year](62 y.o. MOval LinseyPrimary Care Ambers Iyengar: LErik ObeyOther Clinician: Referring Bricyn Labrada: Treating Adilenne Ashworth/Extender: SCaroline SaugerWeeks in Treatment: 39 Clinic  Level of Care Assessment Items TOOL 4 Quantity Score X- 1 0 Use when only an EandM is performed on FOLLOW-UP visit ASSESSMENTS - Nursing Assessment / Reassessment X- 1 10 Reassessment of Co-morbidities (includes updates in patient status) LAMOGH, BEAUMAN([id number] Page 2 of 12 X- 1 5 Reassessment of Adherence to Treatment Plan ASSESSMENTS - Wound and Skin A ssessment / Reassessment []$  - 0 Simple Wound Assessment / Reassessment - one wound X- 3 5 Complex Wound Assessment / Reassessment - multiple wounds []$  - 0 Dermatologic / Skin Assessment (not related to wound area) ASSESSMENTS - Focused Assessment []$  - 0 Circumferential Edema Measurements - multi extremities []$  - 0 Nutritional Assessment / Counseling / Intervention []$  - 0 Lower Extremity Assessment (monofilament, tuning fork, pulses) []$  - 0 Peripheral Arterial Disease Assessment (using hand held doppler) ASSESSMENTS - Ostomy and/or Continence Assessment and Care []$  - 0 Incontinence Assessment and Management []$  - 0 Ostomy Care Assessment and Management (repouching, etc.) PROCESS - Coordination of Care X - Simple Patient / Family Education for ongoing care 1 15 []$  - 0 Complex (extensive) Patient / Family Education for ongoing care []$  - 0 Staff obtains CProgrammer, systems Records, T Results / Process Orders est []$  - 0 Staff telephones HHA, Nursing Homes / Clarify orders / etc []$  - 0 Routine Transfer to another Facility (non-emergent condition) []$  - 0 Routine Hospital Admission (non-emergent condition) []$  - 0 New Admissions / IBiomedical engineer/ Ordering NPWT Apligraf, etc. , []$  - 0 Emergency Hospital Admission (emergent condition) X- 1 10 Simple Discharge Coordination []$  - 0 Complex (extensive) Discharge Coordination PROCESS - Special Needs []$  - 0 Pediatric / Minor Patient Management []$  - 0 Isolation Patient Management []$  - 0 Hearing / Language / Visual special needs []$   - 0 Assessment of Community assistance (transportation, D/C planning, etc.) []$  - 0 Additional assistance / Altered mentation []$  - 0 Support Surface(s) Assessment (bed, cushion, seat, etc.) INTERVENTIONS - Wound Cleansing / Measurement []$  -  0 Simple Wound Cleansing - one wound X- 3 5 Complex Wound Cleansing - multiple wounds X- 1 5 Wound Imaging (photographs - any number of wounds) []$  - 0 Wound Tracing (instead of photographs) []$  - 0 Simple Wound Measurement - one wound X- 3 5 Complex Wound Measurement - multiple wounds INTERVENTIONS - Wound Dressings X - Small Wound Dressing one or multiple wounds 2 10 []$  - 0 Medium Wound Dressing one or multiple wounds []$  - 0 Large Wound Dressing one or multiple wounds []$  - 0 Application of Medications - topical []$  - 0 Application of Medications - injection Mattern, [name] ([id number]) [id number] Page 3 of 12 INTERVENTIONS - Miscellaneous []$  - 0 External ear exam []$  - 0 Specimen Collection (cultures, biopsies, blood, body fluids, etc.) []$  - 0 Specimen(s) / Culture(s) sent or taken to Lab for analysis []$  - 0 Patient Transfer (multiple staff / Harrel Lemon Lift / Similar devices) []$  - 0 Simple Staple / Suture removal (25 or less) []$  - 0 Complex Staple / Suture removal (26 or more) []$  - 0 Hypo / Hyperglycemic Management (close monitor of Blood Glucose) []$  - 0 Ankle / Brachial Index (ABI) - do not check if billed separately X- 1 5 Vital Signs Has the patient been seen at the hospital within the last three years: Yes Total Score: 115 Level Of Care: New/Established - Level 3 Electronic Signature(s) Unsigned Entered ByCarlene Coria on 09/09/2022 11:55:22 -------------------------------------------------------------------------------- Encounter Discharge Information Details Patient Name: Date of Service: DONIVEN, KAMM 09/09/2022 11:15 A M Medical Record Number: [id number] Patient Account Number:  [id number] Date of Birth/Sex: Treating RN: 05-Apr-1961 (62 y.o. [name]) Carlene Coria Primary Care [name] Mcintyre: [name] Other Clinician: Referring Lorn Butcher: Treating Inas Avena/Extender: [name] in Treatment: 39 Encounter Discharge Information Items Discharge Condition: Stable Ambulatory Status: Wheelchair Discharge Destination: Home Transportation: Private Auto Accompanied By: self Schedule Follow-up Appointment: Yes Clinical Summary of Care: Electronic Signature(s) Unsigned Entered ByCarlene Coria on 09/09/2022 11:37:38 Signature(s): [name] ([id number]) [id number]. Date(s): pdf Page 4 of 12 -------------------------------------------------------------------------------- Lower Extremity Assessment Details Patient Name: Date of Service: KAMALI, MENG 09/09/2022 11:15 A M Medical Record Number: [id number] Patient Account Number: [id number] Date of Birth/Sex: Treating RN: 1961-03-11 (61 y.o. [name]) Carlene Coria Primary Care Agustin Swatek: [name] Other Clinician: Referring Birl Lobello: Treating Dandrea Medders/Extender: [name] in Treatment: 39 Edema Assessment Assessed: [Left: No] [Right: No] Edema: [Left: N] [Right: o] Calf Left: Right: Point of Measurement: 33 cm From Medial Instep 33 cm Ankle Left: Right: Point of Measurement: 12 cm From Medial Instep 22 cm Vascular Assessment Pulses: Dorsalis Pedis Palpable: [Left:Yes] Electronic Signature(s) Unsigned Entered ByCarlene Coria on 09/09/2022 11:26:27 -------------------------------------------------------------------------------- Multi Wound Chart Details Patient Name: Date of Service: CORTLAN, CORTES 09/09/2022 11:15 A M Medical Record Number: [id number] Patient Account Number: [id number] Date of Birth/Sex: Treating RN: 1960-08-03 (61 y.o. [name] Primary Care Shalona Harbour: [name] Other Clinician: Referring Hugh Garrow: Treating  Wanna Gully/Extender: [name] in Treatment: 39 Vital Signs Height(in): 69 Pulse(bpm): 86 Weight(lbs): 150 Blood Pressure(mmHg): 108/75 Body Mass Index(BMI): 22.1 Temperature(F): 98 Schamp, Gergory ([id number]) [id number] Page 5 of 12 Respiratory Rate(breaths/min): 18 [12:Photos:] Left, Distal, Medial Ankle Left, Dorsal Foot Left, Proximal, Midline Lower Leg Wound Location: Gradually Appeared Pressure Injury Gradually Appeared Wounding Event: Venous Leg Ulcer Pressure Ulcer Abrasion Primary Etiology: Anemia, Chronic Obstructive Anemia, Chronic Obstructive Anemia, Chronic Obstructive Comorbid History: Pulmonary Disease (COPD), Coronary Pulmonary Disease (COPD), Coronary Pulmonary Disease (COPD), Coronary Artery Disease,  Peripheral Arterial Artery Disease, Peripheral Arterial Artery Disease, Peripheral Arterial Disease, Peripheral Venous Disease, Disease, Peripheral Venous Disease, Disease, Peripheral Venous Disease, Hepatitis C, Osteoarthritis, Hepatitis C, Osteoarthritis, Hepatitis C, Osteoarthritis, Neuropathy, Received Chemotherapy Neuropathy, Received Chemotherapy Neuropathy, Received Chemotherapy 07/12/2019 08/11/2022 09/02/2022 Date Acquired: 88 4 1 Weeks of Treatment: Open Open Open Wound Status: No No No Wound Recurrence: 2x0.5x0.2 0x0x0 0.2x0.3x0.1 Measurements L x W x D (cm) 0.785 0 0.047 A (cm) : rea 0.157 0 0.005 Volume (cm) : 88.60% 100.00% 85.00% % Reduction in Area: 88.60% 100.00% 83.90% % Reduction in Volume: Full Thickness Without Exposed Category/Stage III Full Thickness Without Exposed Classification: Support Structures Support Structures Medium None Present Small Exudate Amount: Serosanguineous N/A Serosanguineous Exudate Type: red, brown N/A red, brown Exudate Color: Distinct, outline attached Flat and Intact N/A Wound Margin: None Present (0%) None Present (0%) Large (67-100%) Granulation  Amount: N/A N/A Red Granulation Quality: Large (67-100%) None Present (0%) None Present (0%) Necrotic Amount: Fat Layer (Subcutaneous Tissue): Yes Fascia: No Fat Layer (Subcutaneous Tissue): Yes Exposed Structures: Fascia: No Fat Layer (Subcutaneous Tissue): No Fascia: No Tendon: No Tendon: No Tendon: No Muscle: No Muscle: No Muscle: No Joint: No Joint: No Joint: No Bone: No Bone: No Bone: No Large (67-100%) Large (67-100%) Medium (34-66%) Epithelialization: Treatment Notes Electronic Signature(s) Unsigned Entered ByCarlene Coria on 09/09/2022 11:27:21 -------------------------------------------------------------------------------- Multi-Disciplinary Care Plan Details Patient Name: Date of Service: EADEN, CRADDICK 09/09/2022 11:15 A M Medical Record Number: [id number] Patient Account Number: [id number] Date of Birth/Sex: Treating RN: 06/15/1961 (61 y.o. [name] Primary Care Sheryl Towell: [name] Other Clinician: Referring Tine Mabee: Treating Eveny Anastas/Extender: [name] in Treatment: [place] ([id number]) [phone].pdf Page 6 of 12 Active Inactive Venous Leg Ulcer Nursing Diagnoses: Knowledge deficit related to disease process and management Goals: Patient will maintain optimal edema control Date Initiated: 12/06/2021 Date Inactivated: 12/27/2021 Target Resolution Date: 01/03/2022 Goal Status: Met Patient/caregiver will verbalize understanding of disease process and disease management Date Initiated: 12/06/2021 Target Resolution Date: 09/27/2022 Goal Status: Active Interventions: Assess peripheral edema status every visit. Compression as ordered Notes: Electronic Signature(s) Unsigned Entered By: Carlene Coria on 09/09/2022 11:27:57 -------------------------------------------------------------------------------- Pain Assessment Details Patient Name: Date of Service: JOSTON, PATTON 09/09/2022 11:15  A M Medical Record Number: [id number] Patient Account Number: [id number] Date of Birth/Sex: Treating RN: [birth year] (61 y.o. [name] Primary Care Maresa Morash: [name] Other Clinician: Referring Chole Driver: Treating Chrisa Hassan/Extender: [name] in Treatment: 39 Active Problems Location of Pain Severity and Description of Pain Patient Has Paino No Site Locations [place], [place] ([id number]) [id number] Page 7 of 12 Pain Management and Medication Current Pain Management: Electronic Signature(s) Unsigned Entered By: Carlene Coria on 09/09/2022 11:19:16 -------------------------------------------------------------------------------- Patient/Caregiver Education Details Patient Name: Date of Service: KEYMONI, KAMRADT 2/13/2024andnbsp11:15 [place] Record Number: [id number] Patient Account Number: [id number] Date of Birth/Gender: Treating RN: May 20, 1961 (61 y.o. [name] Primary Care Physician: [name] Other Clinician: Referring Physician: Treating Physician/Extender: [name] in Treatment: 39 Education Assessment Education Provided To: Patient Education Topics Provided Wound/Skin Impairment: Methods: Explain/Verbal Responses: State content correctly Electronic Signature(s) Unsigned Entered ByCarlene Coria on 09/09/2022 11:27:35 -------------------------------------------------------------------------------- Wound Assessment Details Patient Name: Date of Service: DAMONE, SPURGIN 09/09/2022 11:15 A M Medical Record Number: [id number] Patient Account Number: [id number] Date of Birth/Sex: Treating RN: 04/28/1961 (61 y.o. [name] Primary Care Orson Rho: [name] Other Clinician: Referring Gehrig Patras: Treating Beaux Wedemeyer/Extender: [name] in Treatment:  [place] ([id number]) [id number] Page 8 of 12 Wound Status Wound  Number: 12 Primary Venous Leg Ulcer Etiology: Wound Location: Left, Distal, Medial Ankle Wound Open Wounding Event: Gradually Appeared Status: Date Acquired: 07/12/2019 Comorbid Anemia, Chronic Obstructive Pulmonary Disease (COPD), Coronary Weeks Of Treatment: 39 History: Artery Disease, Peripheral Arterial Disease, Peripheral Venous Clustered Wound: No Disease, Hepatitis C, Osteoarthritis, Neuropathy, Received Chemotherapy Photos Wound Measurements Length: (cm) 2 Width: (cm) 0.5 Depth: (cm) 0.2 Area: (cm) 0.785 Volume: (cm) 0.157 % Reduction in Area: 88.6% % Reduction in Volume: 88.6% Epithelialization: Large (67-100%) Tunneling: No Undermining: No Wound Description Classification: Full Thickness Without Exposed Support Structures Wound Margin: Distinct, outline attached Exudate Amount: Medium Exudate Type: Serosanguineous Exudate Color: red, brown Foul Odor After Cleansing: No Slough/Fibrino Yes Wound Bed Granulation Amount: None Present (0%) Exposed Structure Necrotic Amount: Large (67-100%) Fascia Exposed: No Necrotic Quality: Adherent Slough Fat Layer (Subcutaneous Tissue) Exposed: Yes Tendon Exposed: No Muscle Exposed: No Joint Exposed: No Bone Exposed: No Treatment Notes Wound #12 (Ankle) Wound Laterality: Left, Medial, Distal Cleanser Soap and Water Discharge Instruction: Gently cleanse wound with antibacterial soap, rinse and pat dry prior to dressing wounds Wound Cleanser Discharge Instruction: Wash your hands with soap and water. Remove old dressing, discard into plastic bag and place into trash. Cleanse the wound with Wound Cleanser prior to applying a clean dressing using gauze sponges, not tissues or cotton balls. Do not scrub or use excessive force. Pat dry using gauze sponges, not tissue or cotton balls. Peri-Wound Care AandD Ointment Discharge Instruction: Apply AandD Ointment to areas of redness Topical Primary Dressing Hydrofera Blue Ready  Transfer Foam, 4x5 (in/in) Discharge Instruction: Apply Hydrofera Blue Ready to wound bed as directed Secondary Dressing ABD Pad 5x9 (in/in) Discharge Instruction: Cover with ABD pad Secured With [name] ([id number]) [id number] Page 9 of 12 Kerlix Roll Sterile or Non-Sterile 6-ply 4.5x4 (yd/yd) Discharge Instruction: Apply Kerlix as directed Compression Wrap Compression Stockings Add-Ons Electronic Signature(s) Unsigned Entered By: Carlene Coria on 09/09/2022 11:25:06 -------------------------------------------------------------------------------- Wound Assessment Details Patient Name: Date of Service: FARDIN, MALSON 09/09/2022 11:15 A M Medical Record Number: [id number] Patient Account Number: [id number] Date of Birth/Sex: Treating RN: 1960-08-07 (61 y.o. [name]) Carlene Coria Primary Care Analiya Porco: [name] Other Clinician: Referring [name]: Treating Jarrius Huaracha/Extender: [name] in Treatment: 39 Wound Status Wound Number: 13 Primary Pressure Ulcer Etiology: Wound Location: Left, Dorsal Foot Wound Open Wounding Event: Pressure Injury Status: Date Acquired: 08/11/2022 Comorbid Anemia, Chronic Obstructive Pulmonary Disease (COPD), Coronary Weeks Of Treatment: 4 History: Artery Disease, Peripheral Arterial Disease, Peripheral Venous Clustered Wound: No Disease, Hepatitis C, Osteoarthritis, Neuropathy, Received Chemotherapy Photos Wound Measurements Length: (cm) Width: (cm) Depth: (cm) Area: (cm) Volume: (cm) 0 % Reduction in Area: 100% 0 % Reduction in Volume: 100% 0 Epithelialization: Large (67-100%) 0 Tunneling: No 0 Undermining: No Wound Description Classification: Category/Stage III Wound Margin: Flat and Intact Exudate Amount: None Present Foul Odor After Cleansing: No Slough/Fibrino No Wound Bed Granulation Amount: None Present (0%) Exposed Structure [name] ([id number])  [id number] Page 10 of 12 Necrotic Amount: None Present (0%) Fascia Exposed: No Fat Layer (Subcutaneous Tissue) Exposed: No Tendon Exposed: No Muscle Exposed: No Joint Exposed: No Bone Exposed: No Electronic Signature(s) Unsigned Entered ByCarlene Coria on 09/09/2022 11:25:35 -------------------------------------------------------------------------------- Wound Assessment Details Patient Name: Date of Service: HASTON, BOSSHART 09/09/2022 11:15 A M Medical Record Number: [id number] Patient Account Number: [id number] Date of Birth/Sex: Treating RN: 07/13/1961 (62 y.o. [name]) Carlene Coria Primary Care  Keny Donald: [name] Other Clinician: Referring Santana Edell: Treating Standley Bargo/Extender: [name] in Treatment: 39 Wound Status Wound Number: 14 Primary Abrasion Etiology: Wound Location: Left, Proximal, Midline Lower Leg Wound Open Wounding Event: Gradually Appeared Status: Date Acquired: 09/02/2022 Comorbid Anemia, Chronic Obstructive Pulmonary Disease (COPD), Coronary Weeks Of Treatment: 1 History: Artery Disease, Peripheral Arterial Disease, Peripheral Venous Clustered Wound: No Disease, Hepatitis C, Osteoarthritis, Neuropathy, Received Chemotherapy Photos Wound Measurements Length: (cm) 0.2 Width: (cm) 0.3 Depth: (cm) 0.1 Area: (cm) 0.047 Volume: (cm) 0.005 % Reduction in Area: 85% % Reduction in Volume: 83.9% Epithelialization: Medium (34-66%) Tunneling: No Undermining: No Wound Description Classification: Full Thickness Without Exposed Support Exudate Amount: Small Exudate Type: Serosanguineous Exudate Color: red, brown Structures Foul Odor After Cleansing: No Slough/Fibrino No Wound Bed Corsino, [name] ([id number]) [id number] Page 11 of 12 Granulation Amount: Large (67-100%) Exposed Structure Granulation Quality: Red Fascia Exposed: No Necrotic Amount: None Present (0%) Fat Layer  (Subcutaneous Tissue) Exposed: Yes Tendon Exposed: No Muscle Exposed: No Joint Exposed: No Bone Exposed: No Treatment Notes Wound #14 (Lower Leg) Wound Laterality: Left, Midline, Proximal Cleanser Soap and Water Discharge Instruction: Gently cleanse wound with antibacterial soap, rinse and pat dry prior to dressing wounds Wound Cleanser Discharge Instruction: Wash your hands with soap and water. Remove old dressing, discard into plastic bag and place into trash. Cleanse the wound with Wound Cleanser prior to applying a clean dressing using gauze sponges, not tissues or cotton balls. Do not scrub or use excessive force. Pat dry using gauze sponges, not tissue or cotton balls. Peri-Wound Care AandD Ointment Discharge Instruction: Apply AandD Ointment to areas of redness Topical Primary Dressing Hydrofera Blue Ready Transfer Foam, 4x5 (in/in) Discharge Instruction: Apply Hydrofera Blue Ready to wound bed as directed Secondary Dressing ABD Pad 5x9 (in/in) Discharge Instruction: Cover with ABD pad Secured With Kerlix Roll Sterile or Non-Sterile 6-ply 4.5x4 (yd/yd) Discharge Instruction: Apply Kerlix as directed Compression Wrap Compression Stockings Add-Ons Electronic Signature(s) Unsigned Entered ByCarlene Coria on 09/09/2022 11:26:08 -------------------------------------------------------------------------------- Vitals Details Patient Name: Date of Service: CAPRI, SADLIER 09/09/2022 11:15 A M Medical Record Number: [id number] Patient Account Number: [id number] Date of Birth/Sex: Treating RN: 12-05-1960 (61 y.o. [name] Primary Care Mell Guia: [name] Other Clinician: Referring Shondrika [name]: Treating Jennamarie Goings/Extender: [name] in Treatment: 39 Vital Signs Time Taken: 11:18 Temperature (F): [employer] ([id number]) [id number] Page 12 of 12 Height (in): 69 Pulse (bpm): 86 Weight (lbs): 150 Respiratory  Rate (breaths/min): 18 Body Mass Index (BMI): 22.1 Blood Pressure (mmHg): 108/75 Reference Range: 80 - 120 mg / dl Electronic Signature(s) Unsigned Entered ByCarlene Coria on 09/09/2022 11:19:09 Signature(s): Date(s):

## 2022-09-09 NOTE — Progress Notes (Signed)
DOMINICO, CAMPAGNA ([id number]) [id number] Page 1 of 4 Visit Report for 09/09/2022 Chief Complaint Document Details Patient Name: Date of Service: Chad [name], Chad [name] 09/09/2022 11:15 A M Medical Record Number: [id number] Patient Account Number: [id number] Date of Birth/Sex: Treating RN: 1960-08-12 (61 y.o. [name]) Carlene Coria Primary Care Provider: [name] Other Clinician: Referring Provider: Treating Provider/Extender: [name] in Treatment: 39 Information Obtained from: Patient Chief Complaint Left ankle ulcer Electronic Signature(s) Signed: 09/09/2022 10:59:49 AM By: Worthy Keeler PA-C Entered By: Worthy Keeler on 09/09/2022 10:59:49 -------------------------------------------------------------------------------- Physician Orders Details Patient Name: Date of Service: Chad [name], Chad [name] 09/09/2022 11:15 A M Medical Record Number: [id number] Patient Account Number: [id number] Date of Birth/Sex: Treating RN: March 24, 1961 (61 y.o. 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 Bathing/ Shower/ Hygiene May shower with wound dressing protected with water repellent cover or cast protector. No tub bath. Chad [name], Chad [name] ([id number]) [id number] Page 2 of 4 Anesthetic (Use 'Patient Medications' Section for Anesthetic Order  Entry) Lidocaine applied to wound bed Edema Control - Lymphedema / Segmental Compressive Device / Other Tubigrip double layer applied - size d Elevate legs to the level of the heart and pump ankles as often as possible Elevate leg(s) parallel to the floor when sitting. Medications-Please add to medication list. Other: - apply AandD ointment to leg, do not apply to wound Wound Treatment Wound #12 - Ankle Wound Laterality: Left, Medial, Distal Cleanser: Soap and Water Every Other Day/30 Days Discharge Instructions: Gently cleanse wound with antibacterial soap, rinse and pat dry prior to dressing wounds Cleanser: Wound Cleanser Every Other Day/30 Days Discharge Instructions: Wash your hands with soap and water. Remove old dressing, discard into plastic bag and place into trash. Cleanse the wound with Wound Cleanser prior to applying a clean dressing using gauze sponges, not tissues or cotton balls. Do not scrub or use excessive force. Pat dry using gauze sponges, not tissue or cotton balls. Peri-Wound Care: AandD Ointment Every Other Day/30 Days Discharge Instructions: Apply AandD Ointment to areas of redness Prim Dressing: Hydrofera Blue Ready Transfer Foam, 4x5 (in/in) Every Other Day/30 Days ary Discharge Instructions: Apply Hydrofera Blue Ready to wound bed as directed Secondary Dressing: ABD Pad 5x9 (in/in) Every Other Day/30 Days Discharge Instructions: Cover with ABD pad Secured With: Kerlix Roll Sterile or Non-Sterile 6-ply 4.5x4 (yd/yd) Every Other Day/30 Days Discharge Instructions: Apply Kerlix as directed Wound #14 - Lower Leg Wound Laterality: Left, Midline, Proximal Cleanser: Soap and Water Every Other Day/30 Days Discharge Instructions: Gently cleanse wound with antibacterial soap, rinse and pat dry prior to dressing wounds Cleanser: Wound Cleanser Every Other Day/30 Days Discharge Instructions: Wash your hands with soap and water. Remove old dressing, discard into plastic  bag and place into trash. Cleanse the wound with Wound Cleanser prior to applying a clean dressing using gauze sponges, not tissues or cotton balls. Do not scrub or use excessive force. Pat dry using gauze sponges, not tissue or cotton balls. Peri-Wound Care: AandD Ointment Every Other Day/30 Days Discharge Instructions: Apply  AandD Ointment to areas of redness Prim Dressing: Hydrofera Blue Ready Transfer Foam, 4x5 (in/in) Every Other Day/30 Days ary Discharge Instructions: Apply Hydrofera Blue Ready to wound bed as directed Secondary Dressing: ABD Pad 5x9 (in/in) Every Other Day/30 Days Discharge Instructions: Cover with ABD pad Secured With: Kerlix Roll Sterile or Non-Sterile 6-ply 4.5x4 (yd/yd) Every Other Day/30 Days Discharge Instructions: Apply Kerlix as directed Electronic Signature(s) Unsigned Entered ByCarlene Coria on 09/09/2022 11:37:15 Signature(s): [name] ([id number] Date(s): [id number] Page 3 of 4 -------------------------------------------------------------------------------- Problem List Details Patient Name: Date of Service: Chad [name], Chad [name] 09/09/2022 11:15 A M Medical Record Number: [id number] Patient Account Number: [id number] Date of Birth/Sex: Treating RN: Jan 07, 1961 (61 y.o. [name]) Carlene Coria Primary Care Provider: [name] Other Clinician: Referring Provider: Treating Provider/Extender: [name] in Treatment: 39 Active Problems ICD-10 Encounter Code Description Active Date MDM Diagnosis 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 (idiopathic) with ulcer and inflammation of left 12/06/2021 No Yes lower extremity L97.322 Non-pressure chronic ulcer of left ankle with fat layer exposed 12/06/2021 No Yes L98.492 Non-pressure chronic ulcer of skin of other sites with fat layer exposed 08/11/2022 No Yes I69.354 Hemiplegia and hemiparesis following cerebral infarction affecting left non- 12/06/2021 No  Yes dominant side Inactive Problems Resolved Problems Electronic Signature(s) Signed: 09/09/2022 10:59:46 AM By: Worthy Keeler PA-C Entered By: Worthy Keeler on 09/09/2022 10:59:46 -------------------------------------------------------------------------------- [place] Details Patient Name: Date of Service: Chad [name], Chad [name] 09/09/2022 Medical Record Number: [id number] Patient Account Number: [id number] Date of Birth/Sex: Treating RN: 10/26/1960 (61 y.o. 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

## 2022-09-10 ENCOUNTER — Ambulatory Visit
Admission: RE | Admit: 2022-09-10 | Discharge: 2022-09-10 | Disposition: A | Discharge status: Home / Self Care | Payer: Medicaid Other | Source: Ambulatory Visit | Attending: Internal Medicine | Admitting: Internal Medicine

## 2022-09-10 DIAGNOSIS — C661 Malignant neoplasm of right ureter: Secondary | ICD-10-CM | POA: Insufficient documentation

## 2022-09-10 MED ORDER — IOHEXOL 300 MG/ML  SOLN
100.0000 mL | Freq: Once | INTRAMUSCULAR | Status: AC | PRN
  Administered 2022-09-10: 100 mL via INTRAVENOUS

## 2022-09-10 MED ORDER — HEPARIN SOD (PORK) LOCK FLUSH 100 UNIT/ML IV SOLN
500.0000 [IU] | Freq: Once | INTRAVENOUS | Status: AC
  Administered 2022-09-10: 500 [IU] via INTRAVENOUS

## 2022-09-12 MED FILL — Iron Sucrose Inj 20 MG/ML (Fe Equiv): INTRAVENOUS | Qty: 10 | Status: AC

## 2022-09-15 ENCOUNTER — Inpatient Hospital Stay: Payer: Medicaid Other

## 2022-09-15 ENCOUNTER — Inpatient Hospital Stay: Payer: Medicaid Other | Admitting: Internal Medicine

## 2022-09-15 NOTE — Progress Notes (Deleted)
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  Is alone/in a wheelchair.  Malyke Yannuzzi 62 y.o.  male above history of stage IV-ureteral cancer/history of stage III colon cancer right side; recurrent sigmoid colon cancer [un-resected/under surveillance] and multiple other comorbidities currently on Keytruda is here for follow-up/review results of the CT scan...  In the interim patient was diagnosed with COVID infection at the nursing home.  Patient did not seek any  hospitalizations.  Worsening chronic constipation.  Last non complete bowel movement yesterday.    Wound on left heel not healing well and being seen by wound clinic with next visit tomorrow.     Active medication list provided by [facility] does not have Levothyroxine listed.   Denies any worsening abdominal pain.  No blood in stools or black-colored stools.  No hematuria.  Chronic shortness of breath.  Not any worse.  Review of Systems  Constitutional:  Positive for malaise/fatigue. Negative for chills, diaphoresis, fever and weight loss.  HENT:  Negative for nosebleeds and sore throat.   Eyes:  Negative for double vision.  Respiratory:  Positive for cough and shortness of breath. Negative for hemoptysis and wheezing.   Cardiovascular:  Negative for chest pain, palpitations and orthopnea.  Gastrointestinal:  Positive for constipation. Negative for abdominal pain, blood in stool, diarrhea, heartburn, melena, nausea and vomiting.  Genitourinary:  Negative for dysuria, frequency and urgency.  Musculoskeletal:  Positive for back pain and joint pain.  Skin: Negative.  Negative for itching and rash.  Neurological:  Positive for focal weakness. Negative for dizziness, tingling, weakness and headaches.       Chronic left-sided weakness upper than lower extremity.  Endo/Heme/Allergies:  Does not bruise/bleed easily.  Psychiatric/Behavioral:  Negative for depression. The patient is not nervous/anxious and does not have insomnia.      MEDICAL HISTORY:  Past Medical History:  Diagnosis Date   Anemia    Anxiety    ARF (acute respiratory failure) (HCC)    Bladder cancer (HCC)    COPD (chronic obstructive pulmonary disease) ([place])    Depression    Dysphagia  Family history of colon cancer    Family history of kidney cancer    Family history of leukemia    Family history of prostate cancer    GERD (gastroesophageal reflux disease)    Hepatitis    chronic hep c   Hydronephrosis     Hydronephrosis with ureteral stricture    Hyperlipidemia    Knee pain    Left   Malignant neoplasm of colon ([place])    Nerve pain    Peripheral vascular disease (HCC)    Prostate cancer ([place])    Stroke ([place])    Ureteral cancer, right ([place])    Urinary frequency    Venous hypertension of both lower extremities     SURGICAL HISTORY: Past Surgical History:  Procedure Laterality Date   COLON SURGERY     En bloc extended right hemicolectomy 07/2017   COLONOSCOPY WITH PROPOFOL N/A 11/06/2020   Procedure: COLONOSCOPY WITH PROPOFOL;  Surgeon: Jonathon Bellows, MD;  Location: [facility] ENDOSCOPY;  Service: Gastroenterology;  Laterality: N/A;   COLONOSCOPY WITH PROPOFOL N/A 07/31/2021   Procedure: COLONOSCOPY WITH PROPOFOL;  Surgeon: Kue Bellow, MD;  Location: [facility] ENDOSCOPY;  Service: Endoscopy;  Laterality: N/A;   CYSTOSCOPY W/ RETROGRADES Right 08/30/2018   Procedure: CYSTOSCOPY WITH RETROGRADE PYELOGRAM;  Surgeon: Hollice Espy, MD;  Location: [facility] ORS;  Service: Urology;  Laterality: Right;   CYSTOSCOPY WITH STENT PLACEMENT Right 04/25/2018   Procedure: CYSTOSCOPY WITH STENT PLACEMENT;  Surgeon: Hollice Espy, MD;  Location: [facility] ORS;  Service: Urology;  Laterality: Right;   CYSTOSCOPY WITH STENT PLACEMENT Right 08/30/2018   Procedure: [place];  Surgeon: Hollice Espy, MD;  Location: [facility] ORS;  Service: Urology;  Laterality: Right;   CYSTOSCOPY WITH STENT PLACEMENT Right 03/07/2019   Procedure: CYSTOSCOPY WITH STENT [place];  Surgeon: Hollice Espy, MD;  Location: [facility] ORS;  Service: Urology;  Laterality: Right;   CYSTOSCOPY WITH STENT PLACEMENT Right 11/21/2019   Procedure: CYSTOSCOPY WITH STENT [place];  Surgeon: Hollice Espy, MD;  Location: [facility] ORS;  Service: Urology;  Laterality: Right;   LOWER EXTREMITY ANGIOGRAPHY Left 05/23/2019   Procedure: LOWER EXTREMITY ANGIOGRAPHY;  Surgeon: Algernon Huxley, MD;  Location: [place] [facility] LAB;  Service: Cardiovascular;   Laterality: Left;   LOWER EXTREMITY ANGIOGRAPHY Right 05/30/2019   Procedure: LOWER EXTREMITY ANGIOGRAPHY;  Surgeon: Algernon Huxley, MD;  Location: [place] [facility] LAB;  Service: Cardiovascular;  Laterality: Right;   LOWER EXTREMITY ANGIOGRAPHY Right 02/13/2020   Procedure: LOWER EXTREMITY ANGIOGRAPHY;  Surgeon: Algernon Huxley, MD;  Location: [place] [facility] LAB;  Service: Cardiovascular;  Laterality: Right;   LOWER EXTREMITY ANGIOGRAPHY Left 02/20/2020   Procedure: LOWER EXTREMITY ANGIOGRAPHY;  Surgeon: Algernon Huxley, MD;  Location: [place] [facility] LAB;  Service: Cardiovascular;  Laterality: Left;   PORTA CATH INSERTION N/A 02/28/2019   Procedure: PORTA CATH INSERTION;  Surgeon: Algernon Huxley, MD;  Location: [place] [facility];  Service: Cardiovascular;  Laterality: N/A;   tumor removed       SOCIAL HISTORY: Social History   Socioeconomic History   Marital status: Single    Spouse name: Not on file   Number of children: Not on file   Years of education: Not on file   Highest education level: Not on file  Occupational History   Not on file  Tobacco Use   Smoking status: Every Day    Packs/day: 1.00    Types: Cigarettes   Smokeless tobacco: Never  Vaping Use  Vaping Use: Never used  Substance and Sexual Activity   Alcohol use: Not Currently   Drug use: Not Currently   Sexual activity: Not Currently  Other Topics Concern   Not on file  Social History Narrative    used to live [place]; moved  To [place]- end of April 2019; in Nursing home; 1pp/day; quit alcohol. 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        Family- dad- prostate ca [at 61y]; brother- 26 died of prostate cancer; brother- 59- no cancers [[place]]; sonGerald Stabs [[place]];[name]-32y prostate ca [facility] [place]]; daughter- 36 [[place]]; another daughter 33 [[place]/addict]. will refer genetics counseling. Given MSI- abnormal; highly suspicious of Lynch syndrome.  Patient's son [name] aware of high possible lynch syndrome.   Social Determinants of  Health   Financial Resource Strain: Not on file  Food Insecurity: Not on file  Transportation Needs: Not on file  Physical Activity: Not on file  Stress: Not on file  Social Connections: Not on file  Intimate Partner Violence: Not on file    FAMILY HISTORY: Family History  Problem Relation Age of Onset   Prostate cancer Father 75   Cancer Brother 30       unsure type   Cancer Paternal Uncle        unsure type   Cancer Maternal Grandmother        unsure type   Cancer Paternal Grandmother        unsure type   Kidney cancer Paternal Grandfather    Cancer Other        unsure types   Leukemia Son    Cancer Son        other cancers, possibly colon    ALLERGIES:  is allergic to penicillins.  MEDICATIONS:  Current Outpatient Medications  Medication Sig Dispense Refill   acetaminophen (TYLENOL) 325 MG tablet Take 650 mg by mouth 3 (three) times daily.      aspirin EC 81 MG tablet Take 1 tablet (81 mg total) by mouth daily. 150 tablet 2   atorvastatin (LIPITOR) 10 MG tablet Take 1 tablet (10 mg total) by mouth daily. 30 tablet 11   Calcium Carbonate Antacid 600 MG chewable tablet Chew 2 tablets (1,200 mg total) by mouth daily. 90 tablet 3   carboxymethylcellulose 1 % ophthalmic solution Apply 1 drop to eye 2 (two) times daily.     cholecalciferol (VITAMIN D3) 25 MCG (1000 UNIT) tablet Take 1 tablet (1,000 Units total) by mouth daily. (Patient not taking: Reported on 07/01/2022) 1 tablet 3   clopidogrel (PLAVIX) 75 MG tablet Take 75 mg by mouth daily.     cyclobenzaprine (FLEXERIL) 10 MG tablet Take 10 mg by mouth at bedtime.     escitalopram (LEXAPRO) 5 MG tablet Take 5 mg by mouth daily.     gabapentin (NEURONTIN) 100 MG capsule Take 100 mg by mouth 3 (three) times daily.     gentamicin ointment (GARAMYCIN) 0.1 % Apply 1 application. topically 3 (three) times daily.     levothyroxine (SYNTHROID) 25 MCG tablet Take 1 tablet (25 mcg total) by mouth daily before breakfast. (Patient  not taking: Reported on 07/01/2022) 30 tablet 1   mirtazapine (REMERON) 7.5 MG tablet Take 7.5 mg by mouth at bedtime.     oxycodone (OXY-IR) 5 MG capsule Take 5 mg by mouth every 6 (six) hours.     Oxycodone HCl 10 MG TABS Take 5 mg by mouth every 6 (six) hours as needed for  pain. (Patient not taking: Reported on 08/25/2022)     pantoprazole (PROTONIX) 40 MG tablet Take 40 mg by mouth daily. (Patient not taking: Reported on 07/01/2022)     senna-docusate (SENOKOT-S) 8.6-50 MG tablet Take 1 tablet by mouth daily.     tamsulosin (FLOMAX) 0.4 MG CAPS capsule Take 1 capsule (0.4 mg total) by mouth daily after supper. 30 capsule 3   No current facility-administered medications for this visit.      Marland Kitchen  PHYSICAL EXAMINATION: ECOG PERFORMANCE STATUS: 1 - Symptomatic but completely ambulatory  There were no vitals filed for this visit.   There were no vitals filed for this visit.       Physical Exam HENT:     Head: Normocephalic and atraumatic.     Mouth/Throat:     Pharynx: No oropharyngeal exudate.  Eyes:     Pupils: Pupils are equal, round, and reactive to light.  Cardiovascular:     Rate and Rhythm: Normal rate and regular rhythm.  Pulmonary:     Effort: No respiratory distress.     Breath sounds: No wheezing.  Abdominal:     General: Bowel sounds are normal. There is no distension.     Palpations: Abdomen is soft. There is no mass.     Tenderness: There is no abdominal tenderness. There is no guarding or rebound.  Musculoskeletal:        General: No tenderness. Normal range of motion.     Cervical back: Normal range of motion and neck supple.  Skin:    General: Skin is warm.     Comments: Bilateral lower extremity ulcerations noted.  Pulses intact bilaterally.  Cystlike lesion noted right elbow-nontender.  No erythema noted.  Neurological:     Mental Status: He is oriented to person, place, and time.     Comments: Sleepy but easily arousable.  Chronic weakness left upper  and lower extremity.  Psychiatric:        Mood and Affect: Affect normal.      LABORATORY DATA:  I have reviewed the data as listed Lab Results  Component Value Date   WBC 5.9 08/25/2022   HGB 13.1 08/25/2022   HCT 40.6 08/25/2022   MCV 81.0 08/25/2022   PLT 156 08/25/2022   Recent Labs    06/10/22 1002 07/01/22 0855 08/25/22 1409  NA 135 136 136  K 3.7 4.3 4.1  CL 105 103 103  CO2 24 26 24  $ GLUCOSE 159* 86 88  BUN 18 20 21  $ CREATININE 1.02 1.11 1.03  CALCIUM 8.3* 8.2* 8.4*  GFRNONAA >60 >60 >60  PROT 7.2 7.2 7.5  ALBUMIN 3.2* 3.3* 3.4*  AST 44* 41 32  ALT 25 26 23  $ ALKPHOS 127* 133* 130*  BILITOT 0.4 0.5 0.3    ASSESSMENT & PLAN:   Ureteral cancer, right (HCC) # High-grade urothelial cancer/cytology; likely of the right renal pelvis /upper ureter. On keytruda-CT scan: FEB 14th, 2024-  stable disease noted in the proximal right ureter-without any evidence of progression.  See below-regarding sigmoid cancer- STABLE..    # Proceed with  [place] today; Labs today reviewed; Labs today reviewed;  acceptable for treatment today. Stable.  TSH dec 2023--WNL.   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 FEB 14th, 2024- CT scan- Focal wall thickening in the proximal sigmoid colon; continued worsening of pericolonic lymph nodes; [s/p evaluation with Dr. Bary Castilla over all clinically stable.  Consider  reevaluation with surgery.  # COVID infection December 2023- [no hospitalization]-resolved. STABLE..   # Iron deficiency anemia-hemoglobin 11-12; JULY 2023- iron studies-7/ferritin-18 LOW; s/p  venofer-hemoglobin 13.2 Stable.   # Hypocalcemia- Mild- on Ca+vit D; JUNE 2023- 25-OH vit D levels: 30. Stable.   #Incidental findings on Imaging  CT , 2024: I reviewed/discussed/counseled the patient.      #DISPOSITION: #  Keytruda today;  # follow up in 3 weeks-; MD- labs-cbc/cmp; iron  studies/ferritin; vit D 25-OH;  Keytruda;venofer; -Dr.B.  # I reviewed the blood work- with the patient in detail; also reviewed the imaging independently [as summarized above]; and with the patient in detail.      All questions were answered. The patient knows to call the clinic with any problems, questions or concerns.    Cammie Sickle, MD 09/15/2022 8:55 AM

## 2022-09-15 NOTE — Assessment & Plan Note (Deleted)
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 On keytruda-CT scan: FEB 14th, 2024-  stable disease noted in the proximal right ureter-without any evidence of progression.  See below-regarding sigmoid cancer- STABLE..    # Proceed with  [place] today; Labs today reviewed; Labs today reviewed;  acceptable for treatment today. Stable.  TSH dec 2023--WNL.   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 FEB 14th, 2024- CT scan- Focal wall thickening in the proximal sigmoid colon; continued worsening of pericolonic lymph nodes; [s/p evaluation with Dr. Bary Castilla over all clinically stable.  Consider reevaluation with surgery.  # COVID infection December 2023- [no hospitalization]-resolved. STABLE..   # Iron deficiency anemia-hemoglobin 11-12; JULY 2023- iron studies-7/ferritin-18 LOW; s/p  venofer-hemoglobin 13.2 Stable.   # Hypocalcemia- Mild- on Ca+vit D; JUNE 2023- 25-OH vit D levels: 30. Stable.   #Incidental findings on Imaging  CT , 2024: I reviewed/discussed/counseled the patient.      #DISPOSITION: #  Keytruda today;  # follow up in 3 weeks-; MD- labs-cbc/cmp; iron studies/ferritin; vit D 25-OH;  Keytruda;venofer; -Dr.B.  # I reviewed the blood work- with the patient in detail; also reviewed the imaging independently [as summarized above]; and with the patient in detail.

## 2022-09-16 ENCOUNTER — Other Ambulatory Visit: Payer: Self-pay | Admitting: Internal Medicine

## 2022-09-16 ENCOUNTER — Encounter: Payer: Medicaid Other | Admitting: Physician Assistant

## 2022-09-16 DIAGNOSIS — L97322 Non-pressure chronic ulcer of left ankle with fat layer exposed: Secondary | ICD-10-CM | POA: Diagnosis not present

## 2022-09-17 NOTE — Progress Notes (Addendum)
ARMSTER, SLOVER ([id number]) [id number] Page 1 of 13 Visit Report for 09/16/2022 Chief Complaint Document Details Patient Name: Date of Service: Chad [name], Chad [name] 09/16/2022 8:30 A M Medical Record Number: [id number] Patient Account Number: [id number] Date of Birth/Sex: Treating RN: [birth year] (62 y.o. Chad [name] Primary Care Provider: [name] Other Clinician: Referring Provider: Treating Provider/Extender: [name] in Treatment: 69 Information Obtained from: Patient Chief Complaint Left ankle ulcer Electronic Signature(s) Signed: 09/16/2022 8:32:36 AM By: Worthy Keeler PA-C Entered By: Worthy Keeler on 09/16/2022 08:32:36 -------------------------------------------------------------------------------- Debridement Details Patient Name: Date of Service: Chad [name], Chad [name] 09/16/2022 8:30 A M Medical Record Number: [id number] Patient Account Number: [id number] Date of Birth/Sex: Treating RN: March 27, 1961 (62 y.o. Chad [name] Primary Care Provider: [name] Other Clinician: Referring Provider: Treating Provider/Extender: [name] in Treatment: 40 Debridement Performed for Assessment: Wound #12 Left,Distal,Medial Ankle Performed By: Physician Tommie Sams., PA-C Debridement Type: Debridement Severity of Tissue Pre Debridement: Fat layer exposed Level of Consciousness (Pre-procedure): Awake and Alert Pre-procedure Verification/Time Out Yes - 08:47 Taken: Start Time: 08:47 T Area Debrided (L x W): otal 1.5 (cm) x 0.7 (cm) = 1.05 (cm) Tissue and other material debrided: Viable, Non-Viable, Slough, Subcutaneous, Biofilm, Slough Level: Skin/Subcutaneous Tissue Debridement Description: Excisional Instrument: Curette Bleeding: Minimum Hemostasis Achieved: Pressure Response to Treatment: Procedure was tolerated well Level of Consciousness (Post- Awake and Alert procedure): Chad [name], Chad [name]  ([id number]) [id number] Page 2 of 13 Post Debridement Measurements of Total Wound Length: (cm) 1.5 Width: (cm) 0.7 Depth: (cm) 0.2 Volume: (cm) 0.165 Character of Wound/Ulcer Post Debridement: Stable Severity of Tissue Post Debridement: Fat layer exposed Post Procedure Diagnosis Same as Pre-procedure Electronic Signature(s) Signed: 09/16/2022 4:52:42 PM By: Rosalio Loud MSN RN CNS WTA Signed: 09/16/2022 5:46:15 PM By: Worthy Keeler PA-C Signed: 09/22/2022 9:50:02 AM By: [name] Entered By: [name] on 09/16/2022 08:48:51 -------------------------------------------------------------------------------- HPI Details Patient Name: Date of Service: Chad [name] 09/16/2022 8:30 A M Medical Record Number: [id number] Patient Account Number: [id number] Date of Birth/Sex: Treating RN: February 02, 1961 (62 y.o. Chad [name] Primary Care Provider: [name] Other Clinician: Referring Provider: Treating Provider/Extender: [name] in Treatment: 33 History of Present Illness HPI Description: 10/08/18 on evaluation today patient actually presents to our office for initial evaluation concerning wounds that he has of the bilateral lower extremities. 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 He also has bilateral venous stasis. He apparently has been homeless currently following discharge from the hospital apparently he has been placed at [facility] which is is a skilled nursing facility locally. Nonetheless fortunately he does not show any signs of infection at this time which is good news. In fact several of the wound actually appears to be showing some signs of improvement already in my pinion. There are a couple areas in the left leg in particular there likely gonna  require some sharp debridement to help clear away some necrotic tissue and help with more sufficient healing. No fevers, chills, nausea, or vomiting noted at this time. 10/15/18 on evaluation today patient actually appears to be doing very well in regard to his bilateral lower extremities. He's been tolerating the dressing changes without complication. Fortunately there does not appear to be any evidence of active infection at this time which is great news. Overall I'm  actually very pleased with how this has progressed in just one visits time. Readmission: 08/14/2020 upon evaluation today patient presents for re-evaluation here in our clinic. He is having issues with his left ankle region as well as his right toe and his right heel. He tells me that the toe and heel actually began as a area that was itching that he was scratching and then subsequently opened up into wounds. These may have been abscess areas I presume based on what I am seeing currently. With regard to his left ankle region he tells me this was a similar type occurrence although he does have venous stasis this very well may be more of a venous leg ulcer more than anything. Nonetheless I do believe that the patient would benefit from appropriate and aggressive wound care to try to help get things under better control here. He does have history of a stroke on the left side affecting him to some degree there that he is able to stand although he does have some residual weakness. Otherwise again the patient does have chronic venous insufficiency as previously noted. His arterial studies most recently obtained showed that he had an ABI on the right of 1.16 with a TBI of 0.52 and on the left and ABI of 1.14 with a TBI of 0.81. That was obtained on 06/19/2020. 08/28/2020 upon evaluation today patient appears to be doing decently well in regard to his wounds in general. He has been tolerating the dressing changes without complication. Fortunately  there does not appear to be any signs of active infection which is great news. With that being said I think the [facility] is doing a good job I would recommend that we likely continue with that currently. 09/11/2020 upon evaluation today patient's wounds did not appear to be doing too poorly but again he is not really showing signs of significant improvement with regard to any of the wounds on the right. None of them have Hydrofera Blue on them I am not exactly sure why this is not being followed as the facility did not contact us to let us know of any issues with obtaining dressings or otherwise. With that being said he is supposed to be using Hydrofera Blue on both of the wounds on the right foot as well as the ankle wound on the left side. 09/18/2020 upon evaluation today patient appears to be doing poorly with regard to his wounds. Again right now the left ankle in particular showed signs of extreme maceration. Apparently he was told by someone with staff at [place] they could not get the [facility]. With that being said this is something that is never been relayed to [place] one way or another. Also the patient subsequently has not supposed to have a border gauze dressing on. He should have an ABD pad and roll gauze to secure as this drains much too much just to have a border gauze dressing to cover. Nonetheless the fact that they are not using the appropriate dressing is directly causing deterioration of the left ankle wound it is significantly worse today compared to what it was previous. I did attempt to call [facility] while the patient was here I called three times and got no one to even pick up the phone. After this I had my for an office Chad [name], Chad [name] ([id number]) [id number] Page 3 of 13 coordinator call and she was able to finally get through and leave a message with the D ON as of dictation  of this note which is roughly about an hour  and a half later I still have not been able to speak with anyone at the facility. 09/25/2020 upon evaluation today patient actually showing signs of good improvement which is excellent news. He has been tolerating the dressing changes without complication. Fortunately there is no signs of active infection which is great news. No fevers, chills, nausea, vomiting, or diarrhea. I do feel like the facility has been doing a much better job at taking care of him as far as the dressings are concerned. However the director of nursing never did call me back. 10/09/2020 upon evaluation today patient appears to be doing well with regard to his wound. The toe ulcer did require some debridement but the other 2 areas actually appear to be doing quite well. 10/19/2020 upon evaluation today patient actually appears to be doing very well in regard to his wounds. In fact the heel does appear to be completely healed. The toe is doing better in the medial ankle on the left is also doing better. Overall I think he is headed in the right direction. 10/26/2020 upon evaluation today patient appears to be doing well with regard to his wound. He is showing signs of improvement which is great news and overall I am very pleased with where things stand today. No fevers, chills, nausea, vomiting, or diarrhea. 11/02/2020 upon evaluation today patient appears to be doing well with regard to his wounds. He has been tolerating the dressing changes without complication overall I am extremely pleased with where things stand today. He in regard to the toe is almost completely healed and the medial ankle on the left is doing much better. 11/09/2020 upon evaluation today patient appears to be doing a little poorly in regard to his left medial ankle ulcer. Fortunately there does not appear to be any signs of systemic infection but unfortunately locally he does appear to be infected in fact he has blue-green drainage consistent with  Pseudomonas. 11/16/2020 upon evaluation today patient appears to be doing well with regard to his wound. It actually appears to be doing better. I did place him on gentamicin cream since the Cipro was actually resistant even though he was positive for Pseudomonas on culture. Overall I think that he does seem to be doing better though I am unsure whether or not they have actually been putting the cream on. The patient is not sure that we did talk to the nurse directly and she was going to initiate that treatment. Fortunately there does not appear to be any signs of active infection at this time. No fevers, chills, nausea, vomiting, or diarrhea. 4/28; the area on the right second toe is close to healed. Left medial ankle required debridement 12/07/2020 upon evaluation today patient appears to be doing well with regard to his wounds. In fact the right second toe appears to be completely healed which is great news. Fortunately there does not appear to be any signs of active infection at this time which is also great news. I think we can probably discontinue the gentamicin on top of everything else. 12/14/2020 upon evaluation today patient appears to be doing well with regard to his wound. He is making good progress and overall very pleased with where things stand today. There is no signs of active infection at this time which is great news. 12/28/2020 upon evaluation today patient appears to be doing well with regard to his wounds. He has been tolerating the dressing changes without  complication. Fortunately there is no signs of active infection at this time. No fevers, chills, nausea, vomiting, or diarrhea. 12/28/2020 upon evaluation today patient's wound bed actually showed signs of excellent improvement. He has great epithelization and granulation I do not see any signs of infection overall I am extremely pleased with where things stand at this point. No fevers, chills, nausea, vomiting, or  diarrhea. 01/11/2021 upon evaluation today patient appears to be doing well with regard to his wound on his leg. He has been tolerating the dressing changes without complication. Fortunately there does not appear to be any signs of active infection which is great news. No fevers, chills, nausea, vomiting, or diarrhea. 01/25/2021 upon evaluation today patient appears to be doing well with regard to his wound. He has been tolerating the dressing changes without complication. Fortunately the collagen seems to be doing a great job which is excellent news. No fevers, chills, nausea, vomiting, or diarrhea. 02/08/2021 upon evaluation today patient's wound is actually looking a little bit worse especially in the periwound compared to previous. Fortunately there does not appear to be any signs of infection which is great news with that being said he does have some irritation around the periphery of the wound which has me more concerned. He actually had a dressing on that had not been changed in 3 days. He also is supposed to have daily dressing changes. With regard to the dressing applied he had a silver alginate dressing and silver collagen is what is recommended and ordered. He also had no Desitin around the edges of the wound in the periwound region although that is on the order inspect to be done as well. In general I was very concerned I did contact [facility] actually spoke with Magda Paganini who is the scheduling individual and subsequently she stated that she would pass the information to the D ON apparently the D ON was not available to talk to me when I call today. 02/18/2021 upon evaluation today patient's wound is actually showing signs of improvement. Fortunately there does not appear to be any evidence of infection which is great news overall I am extremely pleased with where things stand today. No fevers, chills, nausea, vomiting, or diarrhea. 8/3; patient presents for 1 week follow-up. He has no  issues or complaints today. He denies signs of infection. 03/11/2021 upon evaluation today patient appears to be doing well with regard to his wound. He does have a little bit of slough noted on the surface of the wound but fortunately there does not appear to be any signs of active infection at this time. No fevers, chills, nausea, vomiting, or diarrhea. 03/18/2021 upon evaluation today patient appears to be doing well with regard to his wound. He has been tolerating the dressing changes without complication. There was a little irritation more proximal to where the wound was that was not noted last week but nonetheless this is very superficial just seems to be more irritation we just need to make sure to put a good amount of the zinc over the area in my opinion. Otherwise he does not seem to be doing significantly worse at all which is great news. 03/25/2021 upon evaluation today patient appears to be doing well with regard to his wound. He is going require some sharp debridement today to clear with some of the necrotic debris. I did perform this today without complication postdebridement wound bed appears to be doing much better this is great news. 04/08/2021 upon evaluation today patient appears  to be doing decently well in regard to his wound although the overall measurement is not significantly smaller compared to previous. It is gone down a little bit but still the facility continues to not really put the appropriate dressings in place in fact he was supposed to have collagen we think he probably had more of an allergy to At this point. Fortunately there does not appear to be any signs of active infection systemically though locally I do not see anything on initial visualization either as far as erythema or warmth. 04/15/2021 upon evaluation today patient appears to be doing well with regard to his wound. He is actually showing signs of improvement. I did place him on antibiotics last week, Cipro. He  has been taking that 2 times a day and seems to be tolerating it very well. I do not see any evidence of worsening and in fact the overall appearance of the wound is smaller today which is also great news. 9/26; left medial ankle chronic venous insufficiency wound is improved. Using Hydrofera Blue 10/10; left medial ankle chronic venous insufficiency. Wound has not changed much in appearance completely nonviable surface. Apparently there have been problems getting the right product on the wound at the facility although he came in with [facility] on today 05/14/2021 upon evaluation today patient appears to be doing well with regard to his wound. I think he is making progress here which is good news. Fortunately there does not appear to be any signs of active infection at this time. No fevers, chills, nausea, vomiting, or diarrhea. 05/20/2021 upon evaluation today patient appears to be doing well with regard to his wound. He is showing signs of good improvement which is great news. There does not appear to be any evidence of active infection which is also excellent news. No fevers, chills, nausea, vomiting, or diarrhea. Chad [name], Chad [name] ([id number]) [id number] Page 4 of 13 05/28/2021 upon evaluation today patient appears to be doing quite well. There does not appear to be any signs of active infection at this time which is great news. Overall I am extremely pleased with where things stand today. I think he is headed in the right direction. 06/11/2021 upon evaluation today patient appears to be doing well with regard to his left ankle ulcer and poorly in regard to the toe ulcer on the second toe right foot. This appears to show signs of joint exposure. Apparently this has been present for 1 to 2 months although he kept forgetting to tell me about it. That is unfortunate as right now it definitely appears to be doing significantly worse than what I would like to see. There  does not appear to be any signs of active infection systemically though locally I am concerned about the possibility of infection the toe is quite red. Again no one from the facility ever contacted [place] to advise that this was going on in the interim either. 06/17/2021 upon evaluation today patient presents for follow-up I did review his x-ray which showed a navicular bone fracture I am unsure of the chronicity of this. Subsequently he also had osteomyelitis of the toe which was what I was more concerned about this did not show up on x-ray but did show up on the pathology scrapings. This was listed as acute osteomyelitis. Nonetheless at this point I think that the antibiotic treatment is the best regimen to go with currently. The patient is in agreement with that plan. Nonetheless he has initially 30 days of doxycycline off  likely extend that towards the end of the treatment cycle that will be around the middle of December for an additional 2 weeks. That all depends on how well he continues to heal. Nonetheless based on what I am seeing in the foot I did want a proceed with an MRI as well which I think will be helpful to identify if there is anything else that needs to be addressed from the standpoint of infection. 06/24/2021 upon evaluation today patient appears to be doing pretty well in regards to his wounds. I think both are actually showing signs of improvement which is good I did review his MRI today which did show signs of osteomyelitis of the middle and proximal phalanx on his right foot of the affected toe. With that being said this is actually showing signs of significant improvement today already with the antibiotic therapy I think the redness is also improved. Overall I think that we just need to give this some time with appropriate wound care we will see how things go potentially hyperbarics could be considered. 07/02/2021 upon inspection today patient actually appears to be doing well in  regard to his left ankle which is getting very close to complete resolution of pleased in that regard. Unfortunately he is continuing to have issues with his second toe right foot and this seems to still be very painful for him. Recommend he try something different from the standpoint of antibiotics. 07/15/2021 upon evaluation today patient appears to be doing actually pretty well in regard to his foot. This is actually showing signs of significant improvement which is great news. Overall I feel like the patient is improving both in regard to the second toe as well as the ankle on the left. With that being said the biggest issue that I do see currently is that he is needing to have a refill of the doxycycline that we previously treated him with. He also did see podiatry they are not going to recommend any amputation at this point since he seems to be doing quite well. For that reason we just need to keep things under control from an infection standpoint. 08/01/2021 upon evaluation today patient appears to be doing well with regard to his wound. He has been tolerating the dressing changes without complication. Fortunately there does not appear to be any evidence of active infection locally nor systemically at this point. In fact I think everything is doing excellent in fact his second toe on the right foot is almost healed and the ankle on the left ankle region is actually very close to being healed as well. 08/08/2021 upon evaluation today patient appears to be doing well with regard to his wound. He has been tolerating the dressing changes without complication. Fortunately I do not see any signs of active infection at this time. Readmission: 12-06-2021 upon evaluation today patient presents for reevaluation here in the clinic he does tell me that he was being seen in facility at [place] by a provider that was coming in. He is not sure who this was. He tells me however that the wound seems to  have gotten worse even compared to where it was when we last saw him at this point. With that being said I do believe that he is likely going need ongoing wound care here in the clinic and I do believe that we need to be the ones to frontline this since his wound does seem to be getting worse not better at this point. He  voiced understanding. He is also in agreement with this plan and feels more comfortable coming here she tells me. Patient's medical history really has not changed since his prior admission he was only gone since January. 12-27-2021 upon evaluation today patient appears to be doing well with regard to his wound they did run out of the [facility] so they did not put anything on just an ABD pad with gentamicin. Still we are seeing some signs of good improvement here with some new epithelization which is great news. 01-10-2022 upon evaluation today patient appears to be doing well with regard to his wounds and he is going require some sharp debridement but overall seems to be making good progress. Fortunately I do not see any evidence of active infection locally or systemically at this time which is great news. 01-24-2022 upon evaluation today patient appears to be doing well with regard to his wound. The facility actually came and dropped him off early and he had another appointment at the hospital and then they just brought him over here and this was still hours before his appointment this afternoon. For that reason we did do our best to work him in this morning and fortunately had some space to make this happen. With that being said patient's wound does seem to be making progress here and I am very pleased in that regard I do not see any signs of active infection locally or systemically at this time. 02-07-2022 patient appears to be doing well currently in regard to his wounds. In fact one of them the more proximal is healed the distal is still open but seems to be doing excellent.  Fortunately I do not see any evidence of active infection locally or systemically at this time which is great news. No fevers, chills, nausea, vomiting, or diarrhea. 7/27; left medial ankle venous. Improving per our intake nurse. We are using Hydrofera Blue under Tubigrip compression. They are changing that at his facility 03-06-2022 upon evaluation today patient appears to be doing well with regard to the his wound he is can require some sharp debridement but seems to be making excellent progress. Fortunately I do not see any evidence of active infection locally or systemically which is great news. 03-27-2022 upon evaluation today patient's wound is actually showing signs of excellent improvement. Fortunately I see no signs of active infection locally or systemically at this time which is great news. No fevers, chills, nausea, vomiting, or diarrhea. 04-21-2022 upon evaluation today patient appears to be doing excellent in regard to his wound in fact this is very close to resolution based on what I am seeing. I do not see any evidence of active infection locally or systemically at this time which is great news and overall I am extremely pleased with where we are today. 05-05-2022 upon evaluation today patient's wound actually appears to potentially be completely healed. Fortunately I do not see any evidence of active infection at this time which is great news and overall I am very pleased I think this needs a little time to toughen up but other than that I really do believe were doing quite well. He is very pleased to hear this its been a long time coming. 05-19-2022 upon evaluation today patient appears to be doing well currently in regard to his wound. He in fact we were hoping will be completely healed and closed but it still has a very tiny area right in the center which is still continuing to drain. Fortunately I  do not see any signs of infection locally or systemically which is great news. 11/6;  this wound is close to completely" he comes in this week with some erythema at 1 edge of this which looks like something was rubbing on the area. Other than that no evidence of infection 06-16-2022 upon evaluation today patient appears to be doing well currently in regard to his ankle ulcer. This is very close to being healed but still has a small area in the central portion which is not. Fortunately there does not appear to be any signs of infection locally or systemically which is great news. No fevers, chills, nausea, vomiting, or diarrhea. 06-30-2022 upon evaluation patient's wound pretty much appears to be almost completely closed. In fact it may even be closed but there are still a lot of skin ZIAIRE, EVERSMAN ([id number]) [id number] Page 5 of 13 irritation around the edges of the wound and I just do not feel great about releasing them yet I would like to continue to monitor this just a little bit longer before getting to that point. He is not opposed to this and in fact is happy to continue to come in if need be in order to make sure that things are moving in the right direction he definitely does not want to backtrack. 07-14-2022 upon evaluation today patient appears to be doing well currently in regard to his wound. Has been tolerating the dressing changes without complication. Fortunately I see no evidence of active infection locally nor systemically which is great news. No fevers, chills, nausea, vomiting, or diarrhea. 08-11-2022 upon evaluation today patient appears to be doing a little worse than last time I saw him although it has been a month. Unfortunately he was not able to be seen due to the fact that he was quarantined in the facility with COVID. Subsequently he tells me that he mention to the facility staff several times about taking it off over the past several weeks but they continue to tell him that someone have to get in touch with [place] here although  nobody ever did until Friday when the nurse manager called to have that documented in the communication notes. With that being said unfortunately this means that he had a wrap on that he was supposed to have on for only 1 week for ended up being on four 1 month essentially. I am glad that there is nothing worse than what we see currently to be perfectly honest. 08-19-2022 upon evaluation today patient appears to be doing well currently in regard to his wounds. He has been tolerating the dressing changes without complication. This is actually smaller today which is great news after last week's reopening. Fortunately I do not see any evidence of infection locally nor systemically at this point. 08-26-2022 upon evaluation today patient appears to be doing well currently in regard to his wound. He has been tolerating the dressing changes without complication. Fortunately there does not appear to be any signs of active infection locally nor systemically which is great news. No fevers, chills, nausea, vomiting, or diarrhea. 09-02-2022 upon evaluation today patient's wound actually showing signs of excellent improvement this is measuring smaller and looking much better. I am very pleased with where we stand I do believe that we are headed in the right direction. 09-09-2022 upon evaluation today patient appears to be doing decently well in regard to his leg in general although there was some swelling this is the 1 thing that I am not too happy  with based on what I see currently. Fortunately there does not appear to be any signs of infection locally nor systemically at this point. 09-16-2022 upon evaluation today patient appears to be doing well currently in regard to his wound. He is actually showing signs of improvement he wore the Tubigrip this week and it significantly better compared to last week's evaluation. Fortunately I do not see any evidence of active infection locally or systemically which is great  news. Overall I think that were headed in the right direction which is great news. In fact overall I think this is the best that he has looked in some time. He does not like the Tubigrip but actually did extremely well with this in my opinion. I think he needs to continue to utilize this. Electronic Signature(s) Signed: 09/16/2022 5:44:39 PM By: Worthy Keeler PA-C Entered By: Worthy Keeler on 09/16/2022 17:44:39 -------------------------------------------------------------------------------- Physical Exam Details Patient Name: Date of Service: Chad [name], Chad [name] 09/16/2022 8:30 A M Medical Record Number: [id number] Patient Account Number: [id number] Date of Birth/Sex: Treating RN: [birth year] (62 y.o. Chad [name] Primary Care Provider: [name] Other Clinician: Referring Provider: Treating Provider/Extender: [name] in Treatment: 76 Constitutional Well-nourished and well-hydrated in no acute distress. Respiratory normal breathing without difficulty. Psychiatric this patient is able to make decisions and demonstrates good insight into disease process. Alert and Oriented x 3. pleasant and cooperative. Notes Upon inspection wound bed actually showed signs of excellent granulation epithelization at this point. Fortunately I do not see any signs of active infection locally or systemically which is great news and overall I do believe that we are on the right track here. patient no fevers, chills, nausea, vomiting, or diarrhea. I think the Tubigrip has been of utmost importance for him I think he needs to continue to utilize this. Electronic Signature(s) Signed: 09/16/2022 5:45:22 PM By: Worthy Keeler PA-C Entered By: Worthy Keeler on 09/16/2022 17:45:22 Chad [name] ([id number]) [id number] Page 6 of 13 -------------------------------------------------------------------------------- Physician Orders Details Patient Name: Date  of Service: SAVEER, [name] 09/16/2022 8:30 A M Medical Record Number: [id number] Patient Account Number: [id number] Date of Birth/Sex: Treating RN: [birth year] (62 y.o. 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 Bathing/ Shower/ Hygiene May shower with wound dressing protected with water repellent cover or cast protector. No tub bath. Anesthetic (Use 'Patient Medications' Section for Anesthetic Order Entry) Lidocaine applied to wound bed Edema Control - Lymphedema / Segmental Compressive Device / Other Patient to wear own compression stockings. Remove compression stockings every night before going to bed and put on every morning when getting up. - please order patient compression stockings 8-71m/hg ankle 23cm, calf 32cm, leg length floor to knee 40cm Elevate legs to the level of the heart and pump ankles as often as possible Elevate leg(s) parallel to the floor when sitting. Medications-Please add to medication list. Other: - apply AandD ointment to leg, do not apply to wound Wound Treatment Wound #12 - Ankle Wound Laterality: Left, Medial, Distal Cleanser:  Soap and Water Every Other Day/30 Days Discharge Instructions: Gently cleanse wound with antibacterial soap, rinse and pat dry prior to dressing wounds Cleanser: Wound Cleanser Every Other Day/30 Days Discharge Instructions: Wash your  hands with soap and water. Remove old dressing, discard into plastic bag and place into trash. Cleanse the wound with Wound Cleanser prior to applying a clean dressing using gauze sponges, not tissues or cotton balls. Do not scrub or use excessive force. Pat dry using gauze sponges, not tissue or cotton balls. Peri-Wound Care: AandD Ointment Every Other Day/30 Days Discharge Instructions: Apply AandD Ointment to areas of redness Prim Dressing: Hydrofera Blue Ready Transfer Foam, 4x5 (in/in) Every Other Day/30 Days ary Discharge Instructions: Apply Hydrofera Blue Ready to wound bed as directed Secondary Dressing: ABD Pad 5x9 (in/in) Every Other Day/30 Days Discharge Instructions: Cover with ABD pad Secured With: Kerlix Roll Sterile or Non-Sterile 6-ply 4.5x4 (yd/yd) Every Other Day/30 Days Discharge Instructions: Apply Kerlix as directed Secured With: Tubigrip Size D, 3x10 (in/yd) Every Other Day/30 Days Discharge Instructions: DD Chad [name] ([id number]) [id number] Page 7 of 13 Electronic Signature(s) Signed: 09/16/2022 5:46:15 PM By: Worthy Keeler PA-C Signed: 09/22/2022 9:50:02 AM By: [name] Entered By: [name] on 09/16/2022 09:09:04 -------------------------------------------------------------------------------- Problem List Details Patient Name: Date of Service: Chad [name] 09/16/2022 8:30 A M Medical Record Number: [id number] Patient Account Number: [id number] Date of Birth/Sex: Treating RN: June 15, 1961 (62 y.o. Chad [name] Primary Care Provider: [name] Other Clinician: Referring Provider: Treating Provider/Extender: [name] in Treatment: 32 Active Problems ICD-10 Encounter Code Description Active Date MDM Diagnosis 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 (idiopathic) with ulcer and inflammation of left 12/06/2021 No Yes lower extremity L97.322 Non-pressure chronic ulcer of left ankle with fat  layer exposed 12/06/2021 No Yes L98.492 Non-pressure chronic ulcer of skin of other sites with fat layer exposed 08/11/2022 No Yes I69.354 Hemiplegia and hemiparesis following cerebral infarction affecting left non- 12/06/2021 No Yes dominant side Inactive Problems Resolved Problems Electronic Signature(s) Signed: 09/16/2022 8:31:12 AM By: Worthy Keeler PA-C Entered By: Worthy Keeler on 09/16/2022 08:31:11 Chad [name] ([id number]) [id number] Page 8 of 13 -------------------------------------------------------------------------------- Progress Note Details Patient Name: Date of Service: Chad [name], Chad [name] 09/16/2022 8:30 A M Medical Record Number: [id number] Patient Account Number: [id number] Date of Birth/Sex: Treating RN: 09-10-1960 (62 y.o. Chad [name] Primary Care Provider: [name] Other Clinician: Referring Provider: Treating Provider/Extender: [name] in Treatment: 53 Subjective Chief Complaint Information obtained from Patient Left ankle ulcer History of Present Illness (HPI) 10/08/18 on evaluation today patient actually presents to our office for initial evaluation concerning wounds that he has of the bilateral lower extremities. 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 He also has bilateral venous stasis. He apparently has been homeless currently following discharge from the hospital apparently he has been placed at [facility] which is is a skilled nursing facility locally. Nonetheless fortunately he does not show any signs of infection at this time which is good news. In fact several of the wound actually appears to be showing some signs of improvement already in my pinion. There are a couple areas in the left leg in particular there likely gonna  require some sharp debridement to help clear away some necrotic tissue and help with more sufficient healing. No fevers, chills, nausea,  or vomiting noted at this time. 10/15/18 on evaluation today patient actually appears to be doing very well in regard to his bilateral lower extremities. He's been tolerating the dressing changes without complication. Fortunately there does not appear to be any evidence of active infection at this time which is great news. Overall I'm actually very pleased with how this has progressed in just one visits time. Readmission: 08/14/2020 upon evaluation today patient presents for re-evaluation here in our clinic. He is having issues with his left ankle region as well as his right toe and his right heel. He tells me that the toe and heel actually began as a area that was itching that he was scratching and then subsequently opened up into wounds. These may have been abscess areas I presume based on what I am seeing currently. With regard to his left ankle region he tells me this was a similar type occurrence although he does have venous stasis this very well may be more of a venous leg ulcer more than anything. Nonetheless I do believe that the patient would benefit from appropriate and aggressive wound care to try to help get things under better control here. He does have history of a stroke on the left side affecting him to some degree there that he is able to stand although he does have some residual weakness. Otherwise again the patient does have chronic venous insufficiency as previously noted. His arterial studies most recently obtained showed that he had an ABI on the right of 1.16 with a TBI of 0.52 and on the left and ABI of 1.14 with a TBI of 0.81. That was obtained on 06/19/2020. 08/28/2020 upon evaluation today patient appears to be doing decently well in regard to his wounds in general. He has been tolerating the dressing changes without complication. Fortunately  there does not appear to be any signs of active infection which is great news. With that being said I think the [facility] is doing a good job I would recommend that we likely continue with that currently. 09/11/2020 upon evaluation today patient's wounds did not appear to be doing too poorly but again he is not really showing signs of significant improvement with regard to any of the wounds on the right. None of them have Hydrofera Blue on them I am not exactly sure why this is not being followed as the facility did not contact us to let us know of any issues with obtaining dressings or otherwise. With that being said he is supposed to be using Hydrofera Blue on both of the wounds on the right foot as well as the ankle wound on the left side. 09/18/2020 upon evaluation today patient appears to be doing poorly with regard to his wounds. Again right now the left ankle in particular showed signs of extreme maceration. Apparently he was told by someone with staff at [facility] they could not get the [facility]. With that being said this is something that is never been relayed to [place] one way or another. Also the patient subsequently has not supposed to have a border gauze dressing on. He should have an ABD pad and roll gauze to secure as this drains much too much just to have a border gauze dressing to cover. Nonetheless the fact that they are not using the appropriate dressing is directly causing deterioration of the left ankle wound it is significantly worse today compared to what it was previous. I did attempt to call [facility] while the  patient was here I called three times and got no one to even pick up the phone. After this I had my for an office coordinator call and she was able to finally get through and leave a message with the D ON as of dictation of this note which is roughly about an hour and a half later I still have not been able to speak with anyone at the  facility. 09/25/2020 upon evaluation today patient actually showing signs of good improvement which is excellent news. He has been tolerating the dressing changes without complication. Fortunately there is no signs of active infection which is great news. No fevers, chills, nausea, vomiting, or diarrhea. I do feel like the facility has been doing a much better job at taking care of him as far as the dressings are concerned. However the director of nursing never did call me back. 10/09/2020 upon evaluation today patient appears to be doing well with regard to his wound. The toe ulcer did require some debridement but the other 2 areas actually appear to be doing quite well. 10/19/2020 upon evaluation today patient actually appears to be doing very well in regard to his wounds. In fact the heel does appear to be completely healed. The toe is doing better in the medial ankle on the left is also doing better. Overall I think he is headed in the right direction. 10/26/2020 upon evaluation today patient appears to be doing well with regard to his wound. He is showing signs of improvement which is great news and overall I am very pleased with where things stand today. No fevers, chills, nausea, vomiting, or diarrhea. 11/02/2020 upon evaluation today patient appears to be doing well with regard to his wounds. He has been tolerating the dressing changes without complication overall I am extremely pleased with where things stand today. He in regard to the toe is almost completely healed and the medial ankle on the left is doing much better. Chad [name], Chad [name] ([id number]) [id number] Page 9 of 13 11/09/2020 upon evaluation today patient appears to be doing a little poorly in regard to his left medial ankle ulcer. Fortunately there does not appear to be any signs of systemic infection but unfortunately locally he does appear to be infected in fact he has blue-green drainage consistent with  Pseudomonas. 11/16/2020 upon evaluation today patient appears to be doing well with regard to his wound. It actually appears to be doing better. I did place him on gentamicin cream since the Cipro was actually resistant even though he was positive for Pseudomonas on culture. Overall I think that he does seem to be doing better though I am unsure whether or not they have actually been putting the cream on. The patient is not sure that we did talk to the nurse directly and she was going to initiate that treatment. Fortunately there does not appear to be any signs of active infection at this time. No fevers, chills, nausea, vomiting, or diarrhea. 4/28; the area on the right second toe is close to healed. Left medial ankle required debridement 12/07/2020 upon evaluation today patient appears to be doing well with regard to his wounds. In fact the right second toe appears to be completely healed which is great news. Fortunately there does not appear to be any signs of active infection at this time which is also great news. I think we can probably discontinue the gentamicin on top of everything else. 12/14/2020 upon evaluation today patient appears to be doing well with regard  to his wound. He is making good progress and overall very pleased with where things stand today. There is no signs of active infection at this time which is great news. 12/28/2020 upon evaluation today patient appears to be doing well with regard to his wounds. He has been tolerating the dressing changes without complication. Fortunately there is no signs of active infection at this time. No fevers, chills, nausea, vomiting, or diarrhea. 12/28/2020 upon evaluation today patient's wound bed actually showed signs of excellent improvement. He has great epithelization and granulation I do not see any signs of infection overall I am extremely pleased with where things stand at this point. No fevers, chills, nausea, vomiting, or  diarrhea. 01/11/2021 upon evaluation today patient appears to be doing well with regard to his wound on his leg. He has been tolerating the dressing changes without complication. Fortunately there does not appear to be any signs of active infection which is great news. No fevers, chills, nausea, vomiting, or diarrhea. 01/25/2021 upon evaluation today patient appears to be doing well with regard to his wound. He has been tolerating the dressing changes without complication. Fortunately the collagen seems to be doing a great job which is excellent news. No fevers, chills, nausea, vomiting, or diarrhea. 02/08/2021 upon evaluation today patient's wound is actually looking a little bit worse especially in the periwound compared to previous. Fortunately there does not appear to be any signs of infection which is great news with that being said he does have some irritation around the periphery of the wound which has me more concerned. He actually had a dressing on that had not been changed in 3 days. He also is supposed to have daily dressing changes. With regard to the dressing applied he had a silver alginate dressing and silver collagen is what is recommended and ordered. He also had no Desitin around the edges of the wound in the periwound region although that is on the order inspect to be done as well. In general I was very concerned I did contact [facility] actually spoke with Magda Paganini who is the scheduling individual and subsequently she stated that she would pass the information to the D ON apparently the D ON was not available to talk to me when I call today. 02/18/2021 upon evaluation today patient's wound is actually showing signs of improvement. Fortunately there does not appear to be any evidence of infection which is great news overall I am extremely pleased with where things stand today. No fevers, chills, nausea, vomiting, or diarrhea. 8/3; patient presents for 1 week follow-up. He has no  issues or complaints today. He denies signs of infection. 03/11/2021 upon evaluation today patient appears to be doing well with regard to his wound. He does have a little bit of slough noted on the surface of the wound but fortunately there does not appear to be any signs of active infection at this time. No fevers, chills, nausea, vomiting, or diarrhea. 03/18/2021 upon evaluation today patient appears to be doing well with regard to his wound. He has been tolerating the dressing changes without complication. There was a little irritation more proximal to where the wound was that was not noted last week but nonetheless this is very superficial just seems to be more irritation we just need to make sure to put a good amount of the zinc over the area in my opinion. Otherwise he does not seem to be doing significantly worse at all which is great news. 03/25/2021 upon evaluation  today patient appears to be doing well with regard to his wound. He is going require some sharp debridement today to clear with some of the necrotic debris. I did perform this today without complication postdebridement wound bed appears to be doing much better this is great news. 04/08/2021 upon evaluation today patient appears to be doing decently well in regard to his wound although the overall measurement is not significantly smaller compared to previous. It is gone down a little bit but still the facility continues to not really put the appropriate dressings in place in fact he was supposed to have collagen we think he probably had more of an allergy to At this point. Fortunately there does not appear to be any signs of active infection systemically though locally I do not see anything on initial visualization either as far as erythema or warmth. 04/15/2021 upon evaluation today patient appears to be doing well with regard to his wound. He is actually showing signs of improvement. I did place him on antibiotics last week, Cipro. He  has been taking that 2 times a day and seems to be tolerating it very well. I do not see any evidence of worsening and in fact the overall appearance of the wound is smaller today which is also great news. 9/26; left medial ankle chronic venous insufficiency wound is improved. Using Hydrofera Blue 10/10; left medial ankle chronic venous insufficiency. Wound has not changed much in appearance completely nonviable surface. Apparently there have been problems getting the right product on the wound at the facility although he came in with [facility] on today 05/14/2021 upon evaluation today patient appears to be doing well with regard to his wound. I think he is making progress here which is good news. Fortunately there does not appear to be any signs of active infection at this time. No fevers, chills, nausea, vomiting, or diarrhea. 05/20/2021 upon evaluation today patient appears to be doing well with regard to his wound. He is showing signs of good improvement which is great news. There does not appear to be any evidence of active infection which is also excellent news. No fevers, chills, nausea, vomiting, or diarrhea. 05/28/2021 upon evaluation today patient appears to be doing quite well. There does not appear to be any signs of active infection at this time which is great news. Overall I am extremely pleased with where things stand today. I think he is headed in the right direction. 06/11/2021 upon evaluation today patient appears to be doing well with regard to his left ankle ulcer and poorly in regard to the toe ulcer on the second toe right foot. This appears to show signs of joint exposure. Apparently this has been present for 1 to 2 months although he kept forgetting to tell me about it. That is unfortunate as right now it definitely appears to be doing significantly worse than what I would like to see. There does not appear to be any signs of active infection systemically though locally I am  concerned about the possibility of infection the toe is quite red. Again no one from the facility ever contacted [place] to advise that this was going on in the interim either. 06/17/2021 upon evaluation today patient presents for follow-up I did review his x-ray which showed a navicular bone fracture I am unsure of the chronicity of this. Subsequently he also had osteomyelitis of the toe which was what I was more concerned about this did not show up on x-ray but did show  up on the pathology scrapings. This was listed as acute osteomyelitis. Nonetheless at this point I think that the antibiotic treatment is the best regimen to go with currently. The patient is in agreement with that plan. Nonetheless he has initially 30 days of doxycycline off likely extend that towards the end of the treatment cycle that will be around the middle of December for an additional 2 weeks. That all depends on how well he continues to heal. Nonetheless based on what I am seeing in the foot I did want a proceed with an MRI as well which I think will be helpful to identify if there is anything else that needs to be addressed from the standpoint of infection. 06/24/2021 upon evaluation today patient appears to be doing pretty well in regards to his wounds. I think both are actually showing signs of improvement which is good I did review his MRI today which did show signs of osteomyelitis of the middle and proximal phalanx on his right foot of the affected toe. With that being said this is actually showing signs of significant improvement today already with the antibiotic therapy I think the redness is also improved. Overall I Chad [name], Chad [name] ([id number]) [id number] Page 10 of 13 think that we just need to give this some time with appropriate wound care we will see how things go potentially hyperbarics could be considered. 07/02/2021 upon inspection today patient actually appears to be doing well in  regard to his left ankle which is getting very close to complete resolution of pleased in that regard. Unfortunately he is continuing to have issues with his second toe right foot and this seems to still be very painful for him. Recommend he try something different from the standpoint of antibiotics. 07/15/2021 upon evaluation today patient appears to be doing actually pretty well in regard to his foot. This is actually showing signs of significant improvement which is great news. Overall I feel like the patient is improving both in regard to the second toe as well as the ankle on the left. With that being said the biggest issue that I do see currently is that he is needing to have a refill of the doxycycline that we previously treated him with. He also did see podiatry they are not going to recommend any amputation at this point since he seems to be doing quite well. For that reason we just need to keep things under control from an infection standpoint. 08/01/2021 upon evaluation today patient appears to be doing well with regard to his wound. He has been tolerating the dressing changes without complication. Fortunately there does not appear to be any evidence of active infection locally nor systemically at this point. In fact I think everything is doing excellent in fact his second toe on the right foot is almost healed and the ankle on the left ankle region is actually very close to being healed as well. 08/08/2021 upon evaluation today patient appears to be doing well with regard to his wound. He has been tolerating the dressing changes without complication. Fortunately I do not see any signs of active infection at this time. Readmission: 12-06-2021 upon evaluation today patient presents for reevaluation here in the clinic he does tell me that he was being seen in facility at [place] by a provider that was coming in. He is not sure who this was. He tells me however that the wound seems to  have gotten worse even compared to where it was when we last  saw him at this point. With that being said I do believe that he is likely going need ongoing wound care here in the clinic and I do believe that we need to be the ones to frontline this since his wound does seem to be getting worse not better at this point. He voiced understanding. He is also in agreement with this plan and feels more comfortable coming here she tells me. Patient's medical history really has not changed since his prior admission he was only gone since January. 12-27-2021 upon evaluation today patient appears to be doing well with regard to his wound they did run out of the [facility] so they did not put anything on just an ABD pad with gentamicin. Still we are seeing some signs of good improvement here with some new epithelization which is great news. 01-10-2022 upon evaluation today patient appears to be doing well with regard to his wounds and he is going require some sharp debridement but overall seems to be making good progress. Fortunately I do not see any evidence of active infection locally or systemically at this time which is great news. 01-24-2022 upon evaluation today patient appears to be doing well with regard to his wound. The facility actually came and dropped him off early and he had another appointment at the hospital and then they just brought him over here and this was still hours before his appointment this afternoon. For that reason we did do our best to work him in this morning and fortunately had some space to make this happen. With that being said patient's wound does seem to be making progress here and I am very pleased in that regard I do not see any signs of active infection locally or systemically at this time. 02-07-2022 patient appears to be doing well currently in regard to his wounds. In fact one of them the more proximal is healed the distal is still open but seems to be doing excellent.  Fortunately I do not see any evidence of active infection locally or systemically at this time which is great news. No fevers, chills, nausea, vomiting, or diarrhea. 7/27; left medial ankle venous. Improving per our intake nurse. We are using Hydrofera Blue under Tubigrip compression. They are changing that at his facility 03-06-2022 upon evaluation today patient appears to be doing well with regard to the his wound he is can require some sharp debridement but seems to be making excellent progress. Fortunately I do not see any evidence of active infection locally or systemically which is great news. 03-27-2022 upon evaluation today patient's wound is actually showing signs of excellent improvement. Fortunately I see no signs of active infection locally or systemically at this time which is great news. No fevers, chills, nausea, vomiting, or diarrhea. 04-21-2022 upon evaluation today patient appears to be doing excellent in regard to his wound in fact this is very close to resolution based on what I am seeing. I do not see any evidence of active infection locally or systemically at this time which is great news and overall I am extremely pleased with where we are today. 05-05-2022 upon evaluation today patient's wound actually appears to potentially be completely healed. Fortunately I do not see any evidence of active infection at this time which is great news and overall I am very pleased I think this needs a little time to toughen up but other than that I really do believe were doing quite well. He is very pleased to hear this  its been a long time coming. 05-19-2022 upon evaluation today patient appears to be doing well currently in regard to his wound. He in fact we were hoping will be completely healed and closed but it still has a very tiny area right in the center which is still continuing to drain. Fortunately I do not see any signs of infection locally or systemically which is great news. 11/6;  this wound is close to completely" he comes in this week with some erythema at 1 edge of this which looks like something was rubbing on the area. Other than that no evidence of infection 06-16-2022 upon evaluation today patient appears to be doing well currently in regard to his ankle ulcer. This is very close to being healed but still has a small area in the central portion which is not. Fortunately there does not appear to be any signs of infection locally or systemically which is great news. No fevers, chills, nausea, vomiting, or diarrhea. 06-30-2022 upon evaluation patient's wound pretty much appears to be almost completely closed. In fact it may even be closed but there are still a lot of skin irritation around the edges of the wound and I just do not feel great about releasing them yet I would like to continue to monitor this just a little bit longer before getting to that point. He is not opposed to this and in fact is happy to continue to come in if need be in order to make sure that things are moving in the right direction he definitely does not want to backtrack. 07-14-2022 upon evaluation today patient appears to be doing well currently in regard to his wound. Has been tolerating the dressing changes without complication. Fortunately I see no evidence of active infection locally nor systemically which is great news. No fevers, chills, nausea, vomiting, or diarrhea. 08-11-2022 upon evaluation today patient appears to be doing a little worse than last time I saw him although it has been a month. Unfortunately he was not able to be seen due to the fact that he was quarantined in the facility with COVID. Subsequently he tells me that he mention to the facility staff several times about taking it off over the past several weeks but they continue to tell him that someone have to get in touch with [place] here although nobody ever did until Friday when the nurse manager called to have that documented in  the communication notes. With that being said unfortunately this means that he had a wrap on that he was supposed to have on for only 1 week for ended up being on four 1 month essentially. I am glad that there is nothing worse than what we see currently to be perfectly honest. 08-19-2022 upon evaluation today patient appears to be doing well currently in regard to his wounds. He has been tolerating the dressing changes without complication. This is actually smaller today which is great news after last week's reopening. Fortunately I do not see any evidence of infection locally nor systemically at this point. 08-26-2022 upon evaluation today patient appears to be doing well currently in regard to his wound. He has been tolerating the dressing changes without complication. Fortunately there does not appear to be any signs of active infection locally nor systemically which is great news. No fevers, chills, nausea, Chad [name], Chad [name] ([id number]) [id number] Page 11 of 13 vomiting, or diarrhea. 09-02-2022 upon evaluation today patient's wound actually showing signs of excellent improvement this is measuring smaller and looking much  better. I am very pleased with where we stand I do believe that we are headed in the right direction. 09-09-2022 upon evaluation today patient appears to be doing decently well in regard to his leg in general although there was some swelling this is the 1 thing that I am not too happy with based on what I see currently. Fortunately there does not appear to be any signs of infection locally nor systemically at this point. 09-16-2022 upon evaluation today patient appears to be doing well currently in regard to his wound. He is actually showing signs of improvement he wore the Tubigrip this week and it significantly better compared to last week's evaluation. Fortunately I do not see any evidence of active infection locally or systemically which is great news.  Overall I think that were headed in the right direction which is great news. In fact overall I think this is the best that he has looked in some time. He does not like the Tubigrip but actually did extremely well with this in my opinion. I think he needs to continue to utilize this. Objective Constitutional Well-nourished and well-hydrated in no acute distress. Vitals Time Taken: 8:30 AM, Height: 69 in, Weight: 150 lbs, BMI: 22.1, Temperature: 97.6 F, Pulse: 85 bpm, Respiratory Rate: 16 breaths/min, Blood Pressure: 120/79 mmHg. Respiratory normal breathing without difficulty. Psychiatric this patient is able to make decisions and demonstrates good insight into disease process. Alert and Oriented x 3. pleasant and cooperative. General Notes: Upon inspection wound bed actually showed signs of excellent granulation epithelization at this point. Fortunately I do not see any signs of active infection locally or systemically which is great news and overall I do believe that we are on the right track here. patient no fevers, chills, nausea, vomiting, or diarrhea. I think the Tubigrip has been of utmost importance for him I think he needs to continue to utilize this. Integumentary (Hair, Skin) Wound #12 status is Open. Original cause of wound was Gradually Appeared. The date acquired was: 07/12/2019. The wound has been in treatment 40 weeks. The wound is located on the Left,Distal,Medial Ankle. The wound measures 1.5cm length x 0.7cm width x 0.2cm depth; 0.825cm^2 area and 0.165cm^3 volume. There is Fat Layer (Subcutaneous Tissue) exposed. There is no tunneling or undermining noted. There is a medium amount of serosanguineous drainage noted. The wound margin is distinct with the outline attached to the wound base. There is large (67-100%) pink granulation within the wound bed. There is a small (1- 33%) amount of necrotic tissue within the wound bed including Adherent Slough. Wound #14 status is Healed  - Epithelialized. Original cause of wound was Gradually Appeared. The date acquired was: 09/02/2022. The wound has been in treatment 2 weeks. The wound is located on the Left,Proximal,Midline Lower Leg. The wound measures 0cm length x 0cm width x 0cm depth; 0cm^2 area and 0cm^3 volume. There is Fat Layer (Subcutaneous Tissue) exposed. There is a none present amount of drainage noted. There is no granulation within the wound bed. There is no necrotic tissue within the wound bed. 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 There was a Excisional Skin/Subcutaneous Tissue Debridement with a total area of 1.05 sq cm performed by Tommie Sams., PA-C. With the following instrument(s): Curette to remove Viable and Non-Viable tissue/material. Material removed includes Subcutaneous Tissue, Slough, and Biofilm. A time out was conducted at 08:47, prior to the start of the procedure. A Minimum amount of bleeding was controlled with Pressure. The procedure was tolerated well. Post Debridement Measurements: 1.5cm length x 0.7cm width x 0.2cm depth; 0.165cm^3 volume. Character of Wound/Ulcer Post Debridement is stable. Severity of Tissue Post Debridement is: Fat layer exposed. Post procedure Diagnosis Wound #12: Same as Pre-Procedure Chad [name], ALARCON ([id number]) [id number] Page 12 of 13 Plan Follow-up Appointments: Return Appointment in 1 week. Bathing/ Shower/ Hygiene: May shower with wound dressing  protected with water repellent cover or cast protector. No tub bath. Anesthetic (Use 'Patient Medications' Section for Anesthetic Order Entry): Lidocaine applied to wound bed Edema Control - Lymphedema / Segmental Compressive Device / Other: Patient to wear own compression stockings. Remove compression stockings every night before going to bed and put on every morning when getting up. - please order patient compression stockings 8-68m/hg ankle 23cm, calf 32cm, leg length floor to knee 40cm Elevate legs to the level of the heart and pump ankles as often as possible Elevate leg(s) parallel to the floor when sitting. Medications-Please add to medication list.: Other: - apply AandD ointment to leg, do not apply to wound WOUND #12: - Ankle Wound Laterality: Left, Medial, Distal Cleanser: Soap and Water Every Other Day/30 Days Discharge Instructions: Gently cleanse wound with antibacterial soap, rinse and pat dry prior to dressing wounds Cleanser: Wound Cleanser Every Other Day/30 Days Discharge Instructions: Wash your hands with soap and water. Remove old dressing, discard into plastic bag and place into trash. Cleanse the wound with Wound Cleanser prior to applying a clean dressing using gauze sponges, not tissues or cotton balls. Do not scrub or use excessive force. Pat dry using gauze sponges, not tissue or cotton balls. Peri-Wound Care: AandD Ointment Every Other Day/30 Days Discharge Instructions: Apply AandD Ointment to areas of redness Prim Dressing: Hydrofera Blue Ready Transfer Foam, 4x5 (in/in) Every Other Day/30 Days ary Discharge Instructions: Apply Hydrofera Blue Ready to wound bed as directed Secondary Dressing: ABD Pad 5x9 (in/in) Every Other Day/30 Days Discharge Instructions: Cover with ABD pad Secured With: Kerlix Roll Sterile or Non-Sterile 6-ply 4.5x4 (yd/yd) Every Other Day/30 Days Discharge Instructions: Apply Kerlix as directed Secured With: Tubigrip Size D, 3x10 (in/yd)  Every Other Day/30 Days Discharge Instructions: DD 1. I am and I suggest that we have the patient continue to monitor for any signs of infection or worsening. Overall I think he is doing quite well and I am very pleased with this I think we can continue with the plan I will see him next week. 2. I am good recommend specifically the Hydrofera Blue followed by the ABD pad some roll gauze secured in place and Tubigrip double layer size D. We will see patient back for reevaluation in 1 week here in the clinic. If anything worsens or changes patient will contact our office for additional recommendations. Electronic Signature(s) Signed: 09/16/2022 5:45:47 PM By: SWorthy KeelerPA-C Entered By: SWorthy Keeleron 09/16/2022 17:45:47 -------------------------------------------------------------------------------- SuperBill Details Patient Name: Date of Service: LCAIGE, TIMMERMANN2/20/2024 Medical Record Number: [id number] Account Number: [id number] of Birth/Sex: Treating RN: 309/01/1961(62y.o. MSeward MethPrimary Care Provider: LErik ObeyOther Clinician: Referring Provider:  Treating Provider/Extender: [name] in Treatment: 40 Diagnosis Coding ICD-10 Codes Code Description [id number] Chronic venous hypertension (idiopathic) with ulcer and inflammation of left lower extremity 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 side Cahall, [name] ([id number]) [id number] Page 13 of [place] Procedures : CPT4 Code: [id number] Description: [id number] - DEB SUBQ TISSUE 20 SQ CM/< ICD-10 Diagnosis Description [id number] Non-pressure chronic ulcer of left ankle with fat layer exposed Modifier: Quantity: 1 Physician Procedures : CPT4 Code  Description Modifier [id number] - WC PHYS SUBQ TISS 20 SQ CM ICD-10 Diagnosis Description [id number] Non-pressure chronic ulcer of left ankle with fat layer exposed Quantity: 1 Electronic Signature(s) Signed: 09/16/2022 5:45:58 PM By: Worthy Keeler PA-C Entered By: Worthy Keeler on 09/16/2022 17:45:58

## 2022-09-18 ENCOUNTER — Inpatient Hospital Stay: Payer: Medicaid Other

## 2022-09-18 ENCOUNTER — Encounter: Payer: Self-pay | Admitting: Nurse Practitioner

## 2022-09-18 ENCOUNTER — Encounter: Payer: Self-pay | Admitting: Internal Medicine

## 2022-09-18 ENCOUNTER — Telehealth: Payer: Self-pay | Admitting: *Deleted

## 2022-09-18 ENCOUNTER — Inpatient Hospital Stay (HOSPITAL_BASED_OUTPATIENT_CLINIC_OR_DEPARTMENT_OTHER): Payer: Medicaid Other | Admitting: Internal Medicine

## 2022-09-18 ENCOUNTER — Non-Acute Institutional Stay: Payer: Medicaid Other | Admitting: Nurse Practitioner

## 2022-09-18 VITALS — BP 119/78 | HR 81 | Temp 96.8°F | Resp 18 | Wt 166.9 lb

## 2022-09-18 DIAGNOSIS — Z8546 Personal history of malignant neoplasm of prostate: Secondary | ICD-10-CM | POA: Diagnosis not present

## 2022-09-18 DIAGNOSIS — Z7189 Other specified counseling: Secondary | ICD-10-CM

## 2022-09-18 DIAGNOSIS — C661 Malignant neoplasm of right ureter: Secondary | ICD-10-CM | POA: Diagnosis present

## 2022-09-18 DIAGNOSIS — Z515 Encounter for palliative care: Secondary | ICD-10-CM

## 2022-09-18 DIAGNOSIS — R630 Anorexia: Secondary | ICD-10-CM

## 2022-09-18 DIAGNOSIS — J439 Emphysema, unspecified: Secondary | ICD-10-CM | POA: Diagnosis not present

## 2022-09-18 DIAGNOSIS — Z5112 Encounter for antineoplastic immunotherapy: Secondary | ICD-10-CM | POA: Diagnosis present

## 2022-09-18 DIAGNOSIS — D500 Iron deficiency anemia secondary to blood loss (chronic): Secondary | ICD-10-CM

## 2022-09-18 DIAGNOSIS — Z79899 Other long term (current) drug therapy: Secondary | ICD-10-CM | POA: Diagnosis not present

## 2022-09-18 DIAGNOSIS — C187 Malignant neoplasm of sigmoid colon: Secondary | ICD-10-CM

## 2022-09-18 DIAGNOSIS — G894 Chronic pain syndrome: Secondary | ICD-10-CM

## 2022-09-18 DIAGNOSIS — D509 Iron deficiency anemia, unspecified: Secondary | ICD-10-CM | POA: Diagnosis not present

## 2022-09-18 DIAGNOSIS — Z8051 Family history of malignant neoplasm of kidney: Secondary | ICD-10-CM | POA: Diagnosis not present

## 2022-09-18 DIAGNOSIS — F17210 Nicotine dependence, cigarettes, uncomplicated: Secondary | ICD-10-CM | POA: Diagnosis not present

## 2022-09-18 DIAGNOSIS — Z8042 Family history of malignant neoplasm of prostate: Secondary | ICD-10-CM | POA: Diagnosis not present

## 2022-09-18 DIAGNOSIS — Z806 Family history of leukemia: Secondary | ICD-10-CM | POA: Diagnosis not present

## 2022-09-18 DIAGNOSIS — Z800 Family history of malignant neoplasm of digestive organs: Secondary | ICD-10-CM | POA: Diagnosis not present

## 2022-09-18 LAB — IRON AND TIBC
Iron: 42 ug/dL — ABNORMAL LOW (ref 45–182)
Saturation Ratios: 10 % — ABNORMAL LOW (ref 17.9–39.5)
TIBC: 412 ug/dL (ref 250–450)
UIBC: 370 ug/dL

## 2022-09-18 LAB — CBC WITH DIFFERENTIAL/PLATELET
Abs Immature Granulocytes: 0.02 10*3/uL (ref 0.00–0.07)
Basophils Absolute: 0.1 10*3/uL (ref 0.0–0.1)
Basophils Relative: 1 %
Eosinophils Absolute: 0.3 10*3/uL (ref 0.0–0.5)
Eosinophils Relative: 5 %
HCT: 42 % (ref 39.0–52.0)
Hemoglobin: 13.4 g/dL (ref 13.0–17.0)
Immature Granulocytes: 0 %
Lymphocytes Relative: 23 %
Lymphs Abs: 1.3 10*3/uL (ref 0.7–4.0)
MCH: 25.9 pg — ABNORMAL LOW (ref 26.0–34.0)
MCHC: 31.9 g/dL (ref 30.0–36.0)
MCV: 81.1 fL (ref 80.0–100.0)
Monocytes Absolute: 0.5 10*3/uL (ref 0.1–1.0)
Monocytes Relative: 9 %
Neutro Abs: 3.5 10*3/uL (ref 1.7–7.7)
Neutrophils Relative %: 62 %
Platelets: 145 10*3/uL — ABNORMAL LOW (ref 150–400)
RBC: 5.18 MIL/uL (ref 4.22–5.81)
RDW: 15.2 % (ref 11.5–15.5)
WBC: 5.7 10*3/uL (ref 4.0–10.5)
nRBC: 0 % (ref 0.0–0.2)

## 2022-09-18 LAB — COMPREHENSIVE METABOLIC PANEL
ALT: 25 U/L (ref 0–44)
AST: 37 U/L (ref 15–41)
Albumin: 3.6 g/dL (ref 3.5–5.0)
Alkaline Phosphatase: 119 U/L (ref 38–126)
Anion gap: 8 (ref 5–15)
BUN: 23 mg/dL (ref 8–23)
CO2: 24 mmol/L (ref 22–32)
Calcium: 8.6 mg/dL — ABNORMAL LOW (ref 8.9–10.3)
Chloride: 104 mmol/L (ref 98–111)
Creatinine, Ser: 1.08 mg/dL (ref 0.61–1.24)
GFR, Estimated: >60 mL/min (ref >60)
Glucose, Bld: 97 mg/dL (ref 70–99)
Potassium: 4.2 mmol/L (ref 3.5–5.1)
Sodium: 136 mmol/L (ref 135–145)
Total Bilirubin: 0.4 mg/dL (ref 0.3–1.2)
Total Protein: 7.9 g/dL (ref 6.5–8.1)

## 2022-09-18 LAB — VITAMIN D 25 HYDROXY (VIT D DEFICIENCY, FRACTURES): Vit D, 25-Hydroxy: 42.97 ng/mL (ref 30–100)

## 2022-09-18 LAB — TSH: TSH: 6.6 u[IU]/mL — ABNORMAL HIGH (ref 0.350–4.500)

## 2022-09-18 LAB — FERRITIN: Ferritin: 27 ng/mL (ref 24–336)

## 2022-09-18 MED ORDER — SODIUM CHLORIDE 0.9 % IV SOLN
200.0000 mg | Freq: Once | INTRAVENOUS | Status: AC
  Administered 2022-09-18: 200 mg via INTRAVENOUS
  Filled 2022-09-18: qty 200

## 2022-09-18 MED ORDER — HEPARIN SOD (PORK) LOCK FLUSH 100 UNIT/ML IV SOLN
500.0000 [IU] | Freq: Once | INTRAVENOUS | Status: AC | PRN
  Administered 2022-09-18: 500 [IU]
  Filled 2022-09-18: qty 5

## 2022-09-18 MED ORDER — SODIUM CHLORIDE 0.9 % IV SOLN
Freq: Once | INTRAVENOUS | Status: AC
  Filled 2022-09-18: qty 250

## 2022-09-18 MED ORDER — SODIUM CHLORIDE 0.9 % IV SOLN
200.0000 mg | Freq: Once | INTRAVENOUS | Status: AC
  Administered 2022-09-18: 200 mg via INTRAVENOUS
  Filled 2022-09-18: qty 8

## 2022-09-18 NOTE — Progress Notes (Signed)
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  Is alone/in a wheelchair.  Chad [name] 62 y.o.  male above history of stage IV-ureteral cancer/history of stage III colon cancer right side; recurrent sigmoid colon cancer [un-resected/under surveillance] and multiple other comorbidities currently on Keytruda is here for follow-up/review results of the CT scan.  Patient had more difficulty standing for wt today due to increased weakness   Wound on left heel not healing well  followed by  wound care.  Denies any worsening abdominal pain.  No blood in stools or black-colored stools.  No hematuria.  Chronic shortness of breath.  Not any worse.  Review of Systems  Constitutional:  Positive for malaise/fatigue. Negative for chills, diaphoresis, fever and weight loss.  HENT:  Negative for nosebleeds and sore throat.   Eyes:  Negative for double vision.  Respiratory:  Positive for cough and shortness of breath. Negative for hemoptysis and wheezing.   Cardiovascular:  Negative for chest pain, palpitations and orthopnea.  Gastrointestinal:  Positive for constipation. Negative for abdominal pain, blood in stool, diarrhea, heartburn, melena, nausea and vomiting.  Genitourinary:  Negative for dysuria, frequency and urgency.  Musculoskeletal:  Positive for back pain and joint pain.  Skin: Negative.  Negative for itching and rash.  Neurological:  Positive for focal weakness. Negative for dizziness, tingling, weakness and headaches.       Chronic left-sided weakness upper than lower extremity.  Endo/Heme/Allergies:  Does not bruise/bleed easily.  Psychiatric/Behavioral:  Negative for depression. The patient is not nervous/anxious and does not have insomnia.      MEDICAL HISTORY:  Past Medical History:  Diagnosis Date   Anemia    Anxiety    ARF (acute respiratory failure) (HCC)    Bladder cancer (HCC)    COPD (chronic obstructive pulmonary disease) ([place])    Depression    Dysphagia    Family history of colon cancer    Family history of kidney cancer    Family history of leukemia    Family history of prostate cancer    GERD (gastroesophageal reflux disease)    Hepatitis    chronic  hep c   Hydronephrosis    Hydronephrosis with ureteral stricture    Hyperlipidemia    Knee pain    Left   Malignant neoplasm of colon (HCC)    Nerve pain    Peripheral vascular disease (HCC)    Prostate cancer (HCC)    Stroke (HCC)    Ureteral cancer, right ([place])    Urinary frequency     Venous hypertension of both lower extremities     SURGICAL HISTORY: Past Surgical History:  Procedure Laterality Date   COLON SURGERY     En bloc extended right hemicolectomy 07/2017   COLONOSCOPY WITH PROPOFOL N/A 11/06/2020   Procedure: COLONOSCOPY WITH PROPOFOL;  Surgeon: Jonathon Bellows, MD;  Location[facility]COPY;  Service: Gastroenterology;  Laterality: N/A;   COLONOSCOPY WITH PROPOFOL N/A 07/31/2021   Procedure: COLONOSCOPY WITH PROPOFOL;  Surgeon: Rey Bellow, MD;  Location: [facility] ENDOSCOPY;  Service: Endoscopy;  Laterality: N/A;   CYSTOSCOPY W/ RETROGRADES Right 08/30/2018   Procedure: CYSTOSCOPY WITH RETROGRADE PYELOGRAM;  Surgeon: Hollice Espy, MD;  Location: [facility] ORS;  Service: Urology;  Laterality: Right;   CYSTOSCOPY WITH STENT PLACEMENT Right 04/25/2018   Procedure: CYSTOSCOPY WITH STENT PLACEMENT;  Surgeon: Hollice Espy, MD;  Location: [facility] ORS;  Service: Urology;  Laterality: Right;   CYSTOSCOPY WITH STENT PLACEMENT Right 08/30/2018   Procedure: [place];  Surgeon: Hollice Espy, MD;  Location: [facility] ORS;  Service: Urology;  Laterality: Right;   CYSTOSCOPY WITH STENT PLACEMENT Right 03/07/2019   Procedure: CYSTOSCOPY WITH STENT [place];  Surgeon: Hollice Espy, MD;  Location: [facility] ORS;  Service: Urology;  Laterality: Right;   CYSTOSCOPY WITH STENT PLACEMENT Right 11/21/2019   Procedure: CYSTOSCOPY WITH STENT [place];  Surgeon: Hollice Espy, MD;  Location: [facility] ORS;  Service: Urology;  Laterality: Right;   LOWER EXTREMITY ANGIOGRAPHY Left 05/23/2019   Procedure: LOWER EXTREMITY ANGIOGRAPHY;  Surgeon: Algernon Huxley, MD;  Location: [place] CV LAB;  Service: Cardiovascular;  Laterality: Left;   LOWER EXTREMITY ANGIOGRAPHY Right 05/30/2019   Procedure: LOWER EXTREMITY ANGIOGRAPHY;  Surgeon: Algernon Huxley, MD;  Location: [place] CV LAB;  Service: Cardiovascular;  Laterality: Right;   LOWER EXTREMITY ANGIOGRAPHY Right 02/13/2020   Procedure:  LOWER EXTREMITY ANGIOGRAPHY;  Surgeon: Algernon Huxley, MD;  Location: [place] CV LAB;  Service: Cardiovascular;  Laterality: Right;   LOWER EXTREMITY ANGIOGRAPHY Left 02/20/2020   Procedure: LOWER EXTREMITY ANGIOGRAPHY;  Surgeon: Algernon Huxley, MD;  Location: [place] CV LAB;  Service: Cardiovascular;  Laterality: Left;   PORTA CATH INSERTION N/A 02/28/2019   Procedure: PORTA CATH INSERTION;  Surgeon: Algernon Huxley, MD;  Location: [place] CV LAB;  Service: Cardiovascular;  Laterality: N/A;   tumor removed       SOCIAL HISTORY: Social History   Socioeconomic History   Marital status: Single    Spouse name: Not on file   Number of children: Not on file   Years of education: Not on file   Highest education level: Not on file  Occupational History   Not on file  Tobacco Use   Smoking status: Every Day    Packs/day: 1.00    Types: Cigarettes   Smokeless tobacco: Never  Vaping Use   Vaping Use: Never used  Substance and Sexual Activity   Alcohol use: Not Currently   Drug use: Not Currently   Sexual activity: Not Currently  Other Topics Concern   Not on file  Social History Narrative  used to live [place]; moved  To [place]- end of April 2019; in Nursing home; 1pp/day; quit alcohol. 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        Family- dad- prostate ca [at 86y]; brother- 62 died of prostate cancer; brother- 49- no cancers [[place]]; sonGerald Stabs [[place]];[name]-32y prostate ca [facility] [place]]; daughter- 27 [[place]]; another daughter 21 [[place]/addict]. will refer genetics counseling. Given MSI- abnormal; highly suspicious of Lynch syndrome.  Patient's son [name] aware of high possible lynch syndrome.   Social Determinants of Health   Financial Resource Strain: Not on file  Food Insecurity: Not on file  Transportation Needs: Not on file  Physical Activity: Not on file  Stress: Not on file  Social Connections: Not on file  Intimate Partner Violence: Not on file    FAMILY HISTORY: Family  History  Problem Relation Age of Onset   Prostate cancer Father 77   Cancer Brother 39       unsure type   Cancer Paternal Uncle        unsure type   Cancer Maternal Grandmother        unsure type   Cancer Paternal Grandmother        unsure type   Kidney cancer Paternal Grandfather    Cancer Other        unsure types   Leukemia Son    Cancer Son        other cancers, possibly colon    ALLERGIES:  is allergic to penicillins.  MEDICATIONS:  Current Outpatient Medications  Medication Sig Dispense Refill   acetaminophen (TYLENOL) 325 MG tablet Take 650 mg by mouth 3 (three) times daily.      aspirin EC 81 MG tablet Take 1 tablet (81 mg total) by mouth daily. 150 tablet 2   atorvastatin (LIPITOR) 10 MG tablet Take 1 tablet (10 mg total) by mouth daily. 30 tablet 11   Calcium Carbonate Antacid 600 MG chewable tablet Chew 2 tablets (1,200 mg total) by mouth daily. 90 tablet 3   carboxymethylcellulose 1 % ophthalmic solution Apply 1 drop to eye 2 (two) times daily.     clopidogrel (PLAVIX) 75 MG tablet Take 75 mg by mouth daily.     cyclobenzaprine (FLEXERIL) 10 MG tablet Take 10 mg by mouth at bedtime.     escitalopram (LEXAPRO) 5 MG tablet Take 5 mg by mouth daily.     gabapentin (NEURONTIN) 100 MG capsule Take 100 mg by mouth 3 (three) times daily.     mirtazapine (REMERON) 7.5 MG tablet Take 7.5 mg by mouth at bedtime.     oxycodone (OXY-IR) 5 MG capsule Take 5 mg by mouth every 6 (six) hours.     senna-docusate (SENOKOT-S) 8.6-50 MG tablet Take 1 tablet by mouth daily.     tamsulosin (FLOMAX) 0.4 MG CAPS capsule Take 1 capsule (0.4 mg total) by mouth daily after supper. 30 capsule 3   cholecalciferol (VITAMIN D3) 25 MCG (1000 UNIT) tablet Take 1 tablet (1,000 Units total) by mouth daily. (Patient not taking: Reported on 07/01/2022) 1 tablet 3   gentamicin ointment (GARAMYCIN) 0.1 % Apply 1 application. topically 3 (three) times daily. (Patient not taking: Reported on 09/18/2022)      levothyroxine (SYNTHROID) 25 MCG tablet Take 1 tablet (25 mcg total) by mouth daily before breakfast. (Patient not taking: Reported on 07/01/2022) 30 tablet 1   Oxycodone HCl 10 MG TABS Take 5 mg by mouth every 6 (six) hours as needed for  pain. (Patient not taking: Reported on 09/18/2022)     pantoprazole (PROTONIX) 40 MG tablet Take 40 mg by mouth daily. (Patient not taking: Reported on 07/01/2022)     No current facility-administered medications for this visit.      Marland Kitchen  PHYSICAL EXAMINATION: ECOG PERFORMANCE STATUS: 1 - Symptomatic but completely ambulatory  Vitals:   09/18/22 0800  BP: 119/78  Pulse: 81  Resp: 18  Temp: (!) 96.8 F (36 C)     Filed Weights   09/18/22 0800  Weight: 166 lb 14.4 oz (75.7 kg)         Physical Exam HENT:     Head: Normocephalic and atraumatic.     Mouth/Throat:     Pharynx: No oropharyngeal exudate.  Eyes:     Pupils: Pupils are equal, round, and reactive to light.  Cardiovascular:     Rate and Rhythm: Normal rate and regular rhythm.  Pulmonary:     Effort: No respiratory distress.     Breath sounds: No wheezing.  Abdominal:     General: Bowel sounds are normal. There is no distension.     Palpations: Abdomen is soft. There is no mass.     Tenderness: There is no abdominal tenderness. There is no guarding or rebound.  Musculoskeletal:        General: No tenderness. Normal range of motion.     Cervical back: Normal range of motion and neck supple.  Skin:    General: Skin is warm.     Comments: Bilateral lower extremity ulcerations noted.  Pulses intact bilaterally.  Cystlike lesion noted right elbow-nontender.  No erythema noted.  Neurological:     Mental Status: He is oriented to person, place, and time.     Comments: Sleepy but easily arousable.  Chronic weakness left upper and lower extremity.  Psychiatric:        Mood and Affect: Affect normal.      LABORATORY DATA:  I have reviewed the data as listed Lab Results   Component Value Date   WBC 5.7 09/18/2022   HGB 13.4 09/18/2022   HCT 42.0 09/18/2022   MCV 81.1 09/18/2022   PLT 145 (L) 09/18/2022   Recent Labs    07/01/22 0855 08/25/22 1409 09/18/22 0829  NA 136 136 136  K 4.3 4.1 4.2  CL 103 103 104  CO2 26 24 24  $ GLUCOSE 86 88 97  BUN 20 21 23  $ CREATININE 1.11 1.03 1.08  CALCIUM 8.2* 8.4* 8.6*  GFRNONAA >60 >60 >60  PROT 7.2 7.5 7.9  ALBUMIN 3.3* 3.4* 3.6  AST 41 32 37  ALT 26 23 25  $ ALKPHOS 133* 130* 119  BILITOT 0.5 0.3 0.4    ASSESSMENT & PLAN:   Ureteral cancer, right (HCC) # High-grade urothelial cancer/cytology; likely of the right renal pelvis /upper ureter. On keytruda-CT scan: FEB 14th, 2024-  stable disease noted in the proximal right ureter-without any evidence of progression.  See below-regarding sigmoid cancer- Stable  # Proceed with  [place] today; Labs today reviewed; Labs today reviewed;  acceptable for treatment today. Stable TSH dec 2023--WNL.   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 FEB 14th, 2024- CT scan- Focal wall thickening in the proximal sigmoid colon; continued worsening of pericolonic lymph nodes; [s/p evaluation with Dr. Bary Castilla over all clinically stable.  Recommend reevaluation with surgery; discussed with surgery.   # Iron deficiency anemia-hemoglobin 11-12; JULY 2023- iron  studies-7/ferritin-18 LOW; s/p  venofer-hemoglobin 13.2 Stable   # Hypocalcemia- Mild- on Ca+vit D; JUNE 2023- 25-OH vit D levels: 30. G+FEB 2024- pending. Stable  #Incidental findings on Imaging  CT , 2024: emphysema; athreosclerosisI reviewed/discussed/counseled the patient.    #DISPOSITION: # refer to [facility] clinic Surgery re: colon cancer #  Keytruda and venofer today # follow up in 3 weeks-; MD- labs-cbc/cmp; Blake Divine; -Dr.B.  # I reviewed the blood work- with the patient in detail; also reviewed the imaging  independently [as summarized above]; and with the patient in detail.      All questions were answered. The patient knows to call the clinic with any problems, questions or concerns.    Cammie Sickle, MD 09/18/2022 9:15 AM

## 2022-09-18 NOTE — Assessment & Plan Note (Addendum)
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 On keytruda-CT scan: FEB 14th, 2024-  stable disease noted in the proximal right ureter-without any evidence of progression.  See below-regarding sigmoid cancer- Stable  # Proceed with  [place] today; Labs today reviewed; Labs today reviewed;  acceptable for treatment today. Stable TSH dec 2023--WNL.   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 FEB 14th, 2024- CT scan- Focal wall thickening in the proximal sigmoid colon; continued worsening of pericolonic lymph nodes; [s/p evaluation with Dr. Bary Castilla over all clinically stable.  Recommend reevaluation with surgery; discussed with surgery.   # Iron deficiency anemia-hemoglobin 11-12; JULY 2023- iron studies-7/ferritin-18 LOW; s/p  venofer-hemoglobin 13.2 Stable   # Hypocalcemia- Mild- on Ca+vit D; JUNE 2023- 25-OH vit D levels: 30. G+FEB 2024- pending. Stable  #Incidental findings on Imaging  CT , 2024: emphysema; athreosclerosisI reviewed/discussed/counseled the patient.    #DISPOSITION: # refer to [facility] clinic Surgery re: colon cancer #  Keytruda and venofer today # follow up in 3 weeks-; MD- labs-cbc/cmp; Blake Divine; -Dr.B.  # I reviewed the blood work- with the patient in detail; also reviewed the imaging independently [as summarized above]; and with the patient in detail.

## 2022-09-18 NOTE — Patient Instructions (Signed)
[facility] [facility]  Discharge Instructions: Thank you for choosing [facility] to provide your oncology and hematology care.  If you have a lab appointment with the Cancer Center, please go directly to the Cancer Center and check in at the registration area.  Wear comfortable clothing and clothing appropriate for easy access to any Portacath or PICC line.   We strive to give you quality time with your provider. You may need to reschedule your appointment if you arrive late (15 or more minutes).  Arriving late affects you and other patients whose appointments are after yours.  Also, if you miss three or more appointments without notifying the office, you may be dismissed from the clinic at the provider's discretion.      For prescription refill requests, have your pharmacy contact our office and allow 72 hours for refills to be completed.     To help prevent nausea and vomiting after your treatment, we encourage you to take your nausea medication as directed.  BELOW ARE SYMPTOMS THAT SHOULD BE REPORTED IMMEDIATELY: *FEVER GREATER THAN 100.4 F (38 C) OR HIGHER *CHILLS OR SWEATING *NAUSEA AND VOMITING THAT IS NOT CONTROLLED WITH YOUR NAUSEA MEDICATION *UNUSUAL SHORTNESS OF BREATH *UNUSUAL BRUISING OR BLEEDING *URINARY PROBLEMS (pain or burning when urinating, or frequent urination) *BOWEL PROBLEMS (unusual diarrhea, constipation, pain near the anus) TENDERNESS IN MOUTH AND THROAT WITH OR WITHOUT PRESENCE OF ULCERS (sore throat, sores in mouth, or a toothache) UNUSUAL RASH, SWELLING OR PAIN  UNUSUAL VAGINAL DISCHARGE OR ITCHING   Items with * indicate a potential emergency and should be followed up as soon as possible or go to the Emergency Department if any problems should occur.  Please show the CHEMOTHERAPY ALERT CARD or IMMUNOTHERAPY ALERT CARD at check-in to the Emergency Department and triage nurse.  Should you have questions after your visit  or need to cancel or reschedule your appointment, please contact [facility] [place] [facility]  [phone] and follow the prompts.  Office hours are 8:00 a.m. to 4:30 p.m. Monday - Friday. Please note that voicemails left after 4:00 p.m. may not be returned until the following business day.  We are closed weekends and major holidays. You have access to a nurse at all times for urgent questions. Please call the main number to the clinic [phone] and follow the prompts.  For any non-urgent questions, you may also contact your provider using MyChart. We now offer e-Visits for anyone 18 and older to request care online for non-urgent symptoms. For details visit [email or web address].   Also download the MyChart app! Go to the app store, search "MyChart", open the app, select [place], and log in with your MyChart username and password.    

## 2022-09-18 NOTE — Progress Notes (Signed)
Patient had more difficulty standing for wt today due to increased weakness.

## 2022-09-18 NOTE — Telephone Encounter (Signed)
Faxed medical records and referral to [facility] surgery Dr Windell Moment . Dx: Colon cancer

## 2022-09-18 NOTE — Progress Notes (Addendum)
[facility] Consult Note Telephone: [phone]  Fax: [phone]    Date of encounter: 09/18/22 12:03 PM PATIENT NAME: Chad [name] [id number]   [phone] (home)  DOB: Sep 13, 1960 MRN: [id number] PRIMARY CARE PROVIDER:    [facility]:    Contact Information     Name Relation Home Work Mobile   Chad [name], Chad [name]   [phone]     I met face to face with patient in facility. Palliative Care was asked to follow this patient by consultation request of  [place] to address advance care planning and complex medical decision making. This is a follow up visit.                                  ASSESSMENT AND PLAN / RECOMMENDATIONS: Symptom Management/Plan: 1. 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 Discussed nutrition. 12/30/2021 weight 148.8 lbs 02/03/2022 weight 154.5 lbs 03/13/2022 weight 158.6 lbs 04/01/2022 weight 156.8 lbs 07/31/2022 weight 167.2 lbs 09/12/2022 weight 169 lbs 3. Chronic pain reviewed; reviewed chronic pain, medications reviewed; Chad [name] endorses manageable with current oxycodone, discussed and educated continue to monitor on pain scale, monitor efficacy vs adverse side effects.Will continue to monitor; encouraged to ask when in pain, Continue to follow by psychotherapy and psychiatry for counseling; supportive services.    4. Palliative care encounter; Palliative medicine team will continue to support patient, patient's family, and medical team. Visit consisted of counseling and education dealing with the complex and emotionally intense issues of symptom management and palliative care in the setting of serious and potentially life-threatening illness   5. Smoking cessation; reviewed  and declined.    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; # Colon ca- stage III   GOALS: palliative   CURRENT/MOST RECENT THERAPY: Keytruda [C]      Urothelial cancer ([place])     Initial Diagnosis     Urothelial cancer ([place])     Ureteral cancer, right ([place])   05/26/2018 Initial Diagnosis     Ureteral cancer, right ([place])     06/15/2018 -  Chemotherapy     Patient is on Treatment Plan : BLADDER Pembrolizumab (200) q21d      06/21/2018 - 03/17/2022 Chemotherapy     Patient is on Treatment Plan : urothelial cancer- pembrolizumab q21d       Follow up Palliative Care Visit: Palliative care will continue to follow for complex medical decision making, advance care planning, and clarification of goals. Return 4 to 8 weeks or prn. I spent 45 minutes providing this consultation starting at 11:35am. More than 50% of the time in this consultation was spent in counseling and care coordination. PPS: 50% Chief Complaint:  Follow up palliative consult for complex medical decision making HISTORY OF PRESENT ILLNESS:  Chad [name] is a 62 y.o. year old male  with multiple medical problems including   late onset CVA, urothelial cancer, prostate cancer, chronic hepatitis C, hyperlipidemia, colon surgery, protein calorie malnutrition, anxiety, depression. Saw Dr [name] Oncology 09/18/2022 for Chad [name] to proceed with  [facility]; labs done for high grade urothelial cancer; sigmoid colon cancer under surveillance; recommended to re-evaluate with sgy; IDA hgb stable; Incidental findings on Imaging  CT , 2024: emphysema; athreosclerosis.  hypocalcemia stable. Chad [name] continues to reside at [place] at [facility] care center for [place] care. Chad [name] transfers himself to w/c, requires assistance for ADL's. Chad [name] feeds himself, goes to dining room for meals. No recent falls, wounds, hospitalizations, infections. I visited and observed Chad [name], he appears comfortable. We talked about Oncology visit, ros, pain specifically and smoking cessation which he continues to decline. We talked about appetite, weight, appointment with Oncology. We talked about functional abilities. We talked about quality of life, residing at facility. Purpose of today PC f/u visit further discussion monitor trends of appetite, weights, monitor for functional, cognitive decline with chronic disease progression, assess any active symptoms, supportive role. Medications, poc, goc reviewed. No new changes recommended, pain currently controlled, continued with treatment. Chad [name] remains stable, weight stable. Chad [name] was thankful for [facility] visit. Therapeutic listening, emotional support provided. Updated staff.    History obtained from review of EMR, discussion with staff and Chad [name].  I reviewed available labs, medications, imaging, studies and related documents from the EMR.  Records reviewed and summarized above.  Exam Constitutional: NAD General: pleasant male ENMT: oral mucous membranes moist CV: S1S2, RRR Pulmonary: breath sounds clear MSK: w/d dependent, +Edema BLE Skin: warm and dry Neuro:  left hemiplegia,  no cognitive impairment Psych: non-anxious affect, A and O x 3 Thank you for the opportunity to participate in the care of Chad [name]. Please call our office at [phone] if we can be of  additional assistance.   Chad Nyquist Ihor Gully, NP

## 2022-09-20 LAB — T4: T4, Total: 7.8 ug/dL (ref 4.5–12.0)

## 2022-09-22 NOTE — Progress Notes (Signed)
Chad, [name] ([id number]) [id number] Page 1 of 9 Visit Report for 09/16/2022 Arrival Information Details Patient Name: Date of Service: Chad [name], Chad [name] 09/16/2022 8:30 A M Medical Record Number: [id number] Patient Account Number: [id number] Date of Birth/Sex: Treating RN: 1960/09/29 (62 y.o. Chad [name] Primary Care Jazziel Fitzsimmons: [name] Other Clinician: Referring Celia Gibbons: Treating Ashlon Lottman/Extender: [name] in Treatment: 41 Visit Information History Since Last Visit Added or deleted any medications: No Patient Arrived: Wheel Chair Has Dressing in Place as Prescribed: Yes Arrival Time: 08:29 Pain Present Now: No Accompanied By: self Transfer Assistance: None Patient Identification Verified: Yes Secondary Verification Process Completed: Yes Patient Requires Transmission-Based No Precautions: Patient Has Alerts: Yes Patient Alerts: Patient on Blood Thinner NOT diabetic aspirin '81mg'$  Lives [facility] SN[facility] Electronic Signature(s) Signed: 09/18/2022 11:31:26 AM By: Gretta Cool, BSN, RN, CWS, Kim RN, BSN Entered By: Gretta Cool, BSN, RN, CWS, Kim on 09/16/2022 08:29:59 -------------------------------------------------------------------------------- Clinic Level of Care Assessment Details Patient Name: Date of Service: Chad [name], Chad [name] 09/16/2022 8:30 A M Medical Record Number: [id number] Patient Account Number: [id number] Date of Birth/Sex: Treating RN: [birth year] (62 y.o. Seward Meth Primary Care Juanna [name]: [name] Other Clinician: Referring Matyas Baisley: Treating Bell Cai/Extender: [name] in Treatment: 40 Clinic Level of Care Assessment Items TOOL 1 Quantity Score '[]'$  - 0 Use when EandM and Procedure is performed on INITIAL visit ASSESSMENTS - Nursing Assessment / Reassessment '[]'$  - 0 General Physical Exam (combine w/ comprehensive assessment (listed just below) when performed on new pt. evals) '[]'$  -  0 Comprehensive Assessment (HX, ROS, Risk Assessments, Wounds Hx, etc.) Chad, [name] ([id number]) [phone].pdf Page 2 of 9 ASSESSMENTS - Wound and Skin Assessment / Reassessment '[]'$  - 0 Dermatologic / Skin Assessment (not related to wound area) ASSESSMENTS - Ostomy and/or Continence Assessment and Care '[]'$  - 0 Incontinence Assessment and Management '[]'$  - 0 Ostomy Care Assessment and Management (repouching, etc.) PROCESS - Coordination of Care '[]'$  - 0 Simple Patient / Family Education for ongoing care '[]'$  - 0 Complex (extensive) Patient / Family Education for ongoing care '[]'$  - 0 Staff obtains Programmer, systems, Records, T Results / Process Orders est '[]'$  - 0 Staff telephones HHA, Nursing Homes / Clarify orders / etc '[]'$  - 0 Routine Transfer to another Facility (non-emergent condition) '[]'$  - 0 Routine Hospital Admission (non-emergent condition) '[]'$  - 0 New Admissions / Biomedical engineer / Ordering NPWT Apligraf, etc. , '[]'$  - 0 Emergency Hospital Admission (emergent condition) PROCESS - Special Needs '[]'$  - 0 Pediatric / Minor Patient Management '[]'$  - 0 Isolation Patient Management '[]'$  - 0 Hearing / Language / Visual special needs '[]'$  - 0 Assessment of Community assistance (transportation, D/C planning, etc.) '[]'$  - 0 Additional assistance / Altered mentation '[]'$  - 0 Support Surface(s) Assessment (bed, cushion, seat, etc.) INTERVENTIONS - Miscellaneous '[]'$  - 0 External ear exam '[]'$  - 0 Patient Transfer (multiple staff / Civil Service fast streamer / Similar devices) '[]'$  - 0 Simple Staple / Suture removal (25 or less) '[]'$  - 0 Complex Staple / Suture removal (26 or more) '[]'$  - 0 Hypo/Hyperglycemic Management (do not check if billed separately) '[]'$  - 0 Ankle / Brachial Index (ABI) - do not check if billed separately Has the patient been seen at the hospital within the last three years: Yes Total Score: 0 Level Of Care: ____ Electronic Signature(s) Signed: 09/22/2022 9:50:02 AM  By: [name] Entered By: [name] on 09/16/2022 08:54:51 -------------------------------------------------------------------------------- Encounter Discharge Information Details Patient Name: Date of Service: Chad [name]  09/16/2022 8:30 A M Medical Record Number: [id number] Patient Account Number: [id number] Date of Birth/Sex: Treating RN: 1960-12-04 (62 y.o. Seward Meth Primary Care Towanda Hornstein: [name] Other Clinician: Referring Jonhatan Hearty: Treating Deby Adger/Extender: [name] New [place], Herbie [name] ([id number]) [id number] Page 3 of 9 Weeks in Treatment: 40 Encounter Discharge Information Items Post Procedure Vitals Discharge Condition: Stable Temperature (F): 97.6 Ambulatory Status: Wheelchair Pulse (bpm): 85 Discharge Destination: Home Respiratory Rate (breaths/min): 18 Transportation: Other Blood Pressure (mmHg): 120/79 Accompanied By: self Schedule Follow-up Appointment: Yes Clinical Summary of Care: Electronic Signature(s) Signed: 09/22/2022 9:50:02 AM By: [name] Entered By: [name] on 09/16/2022 09:10:37 -------------------------------------------------------------------------------- Lower Extremity Assessment Details Patient Name: Date of Service: Chad [name], Chad [name] 09/16/2022 8:30 A M Medical Record Number: [id number] Patient Account Number: [id number] Date of Birth/Sex: Treating RN: Oct 25, 1960 (62 y.o. Chad [name] Primary Care Roman Dubuc: [name] Other Clinician: Referring Jaanai Salemi: Treating Zacharius Funari/Extender: [name] in Treatment: 40 Edema Assessment Assessed: [Left: No] [Right: No] [Left: Edema] [Right: :] Calf Left: Right: Point of Measurement: 33 cm From Medial Instep 33 cm Ankle Left: Right: Point of Measurement: 12 cm From Medial Instep 22.5 cm Vascular Assessment Pulses: Dorsalis Pedis Palpable: [Left:Yes] Electronic Signature(s) Signed: 09/18/2022  11:31:26 AM By: Gretta Cool, BSN, RN, CWS, Kim RN, BSN Entered By: Gretta Cool, BSN, RN, CWS, Kim on 09/16/2022 08:38:41 [name] ([id number]) [phone].pdf Page 4 of 9 -------------------------------------------------------------------------------- Multi Wound Chart Details Patient Name: Date of Service: Chad [name], Chad [name] 09/16/2022 8:30 A M Medical Record Number: [id number] Patient Account Number: [id number] Date of Birth/Sex: Treating RN: [birth year] (62 y.o. Seward Meth Primary Care Doneta Bayman: [name] Other Clinician: Referring Amaal Dimartino: Treating Monicia Tse/Extender: [name] in Treatment: 40 Vital Signs Height(in): 69 Pulse(bpm): 25 Weight(lbs): 150 Blood Pressure(mmHg): 120/79 Body Mass Index(BMI): 22.1 Temperature(F): 97.6 Respiratory Rate(breaths/min): 16 [12:Photos:] [14:No Photos] [N/A:N/A] Left, Distal, Medial Ankle Left, Proximal, Midline Lower Leg N/A Wound Location: Gradually Appeared Gradually Appeared N/A Wounding Event: Venous Leg Ulcer Abrasion N/A Primary Etiology: Anemia, Chronic Obstructive N/A N/A Comorbid History: Pulmonary Disease (COPD), Coronary Artery Disease, Peripheral Arterial Disease, Peripheral Venous Disease, Hepatitis C, Osteoarthritis, Neuropathy, Received Chemotherapy 07/12/2019 09/02/2022 N/A Date Acquired: 40 2 N/A Weeks of Treatment: Open Open N/A Wound Status: No No N/A Wound Recurrence: 1.5x0.7x0.2 0.2x0.2x0.1 N/A Measurements L x W x D (cm) 0.825 0.031 N/A A (cm) : rea 0.165 0.003 N/A Volume (cm) : 88.10% 90.10% N/A % Reduction in Area: 88.10% 90.30% N/A % Reduction in Volume: Full Thickness Without Exposed Full Thickness Without Exposed N/A Classification: Support Structures Support Structures Medium Small N/A Exudate Amount: Serosanguineous Serosanguineous N/A Exudate Type: red, brown red, brown N/A Exudate Color: Distinct, outline attached N/A N/A Wound  Margin: Large (67-100%) N/A N/A Granulation Amount: Pink N/A N/A Granulation Quality: Small (1-33%) N/A N/A Necrotic Amount: Fat Layer (Subcutaneous Tissue): Yes N/A N/A Exposed Structures: Fascia: No Tendon: No Muscle: No Joint: No Bone: No None N/A N/A Epithelialization: Treatment Notes Electronic Signature(s) Signed: 09/22/2022 9:50:02 AM By: Kristine Garbe [id number] [email or web address] Page 5 of 9 Signed: 09/22/2022 9:50:02 Entered By: [name] on 09/16/2022 08:41:18 -------------------------------------------------------------------------------- Multi-Disciplinary Care Plan Details Patient Name: Date of Service: CHAPLIN, Chad [name] 09/16/2022 8:30 A M Medical Record Number: [id number] Patient Account Number: [id number] Date of Birth/Sex: Treating RN: 08-Apr-1961 (62 y.o. Seward Meth Primary Care Fahed [name]: [name] Other Clinician: Referring Lesean Woolverton: Treating Ashur Glatfelter/Extender: [name] in Treatment: 40 Active Inactive Venous Leg Ulcer  Nursing Diagnoses: Knowledge deficit related to disease process and management Goals: Patient will maintain optimal edema control Date Initiated: 12/06/2021 Date Inactivated: 12/27/2021 Target Resolution Date: 01/03/2022 Goal Status: Met Patient/caregiver will verbalize understanding of disease process and disease management Date Initiated: 12/06/2021 Target Resolution Date: 09/27/2022 Goal Status: Active Interventions: Assess peripheral edema status every visit. Compression as ordered Notes: Electronic Signature(s) Signed: 09/16/2022 4:52:42 PM By: Rosalio Loud MSN RN CNS WTA Signed: 09/22/2022 9:50:02 AM By: [name] Entered By: [name] on 09/16/2022 09:09:22 -------------------------------------------------------------------------------- Pain Assessment Details Patient Name: Date of Service: [name] 09/16/2022 8:30 A  M Medical Record Number: [id number] Patient Account Number: [id number] Date of Birth/Sex: Treating RN: Nov 29, 1960 (62 y.o. Chad [name] Primary Care Swayze Kozuch: [name] Other Clinician: Referring Elfego Giammarino: Treating Elice Crigger/Extender: [name] in Treatment: [address], [place] ([id number]) [id number] Page 6 of 9 Active Problems Location of Pain Severity and Description of Pain Patient Has Paino No Site Locations Pain Management and Medication Current Pain Management: Electronic Signature(s) Signed: 09/18/2022 11:31:26 AM By: Gretta Cool, BSN, RN, CWS, Kim RN, BSN Entered By: Gretta Cool, BSN, RN, CWS, Kim on 09/16/2022 08:30:28 -------------------------------------------------------------------------------- Patient/Caregiver Education Details Patient Name: Date of Service: ZEPPLIN, MISAK 2/20/2024andnbsp8:30 A M Medical Record Number: [id number] Patient Account Number: [id number] Date of Birth/Gender: Treating RN: May 25, 1961 (62 y.o. Seward Meth Primary Care Physician: [name] Other Clinician: Referring Physician: Treating Physician/Extender: [name] in Treatment: 79 Education Assessment Education Provided To: Patient Education Topics Provided Wound/Skin Impairment: Handouts: Other: continue wound care as directed Methods: Explain/Verbal Responses: State content correctly Electronic Signature(s) Signed: 09/22/2022 9:50:02 AM By: [name] Entered By: [name] on 09/16/2022 09:09:39 [name] ([id number]) [id number] Page 7 of 9 -------------------------------------------------------------------------------- Wound Assessment Details Patient Name: Date of Service: Chad [name], Chad [name] 09/16/2022 8:30 A M Medical Record Number: [id number] Patient Account Number: [id number] Date of Birth/Sex: Treating RN: 08-23-1960 (62 y.o. [name], [name] Primary Care Jessey Stehlin:  [name] Other Clinician: Referring Ladale Sherburn: Treating Kile Kabler/Extender: [name] in Treatment: 40 Wound Status Wound Number: 12 Primary Venous Leg Ulcer Etiology: Wound Location: Left, Distal, Medial Ankle Wound Open Wounding Event: Gradually Appeared Status: Date Acquired: 07/12/2019 Comorbid Anemia, Chronic Obstructive Pulmonary Disease (COPD), Coronary Weeks Of Treatment: 40 History: Artery Disease, Peripheral Arterial Disease, Peripheral Venous Clustered Wound: No Disease, Hepatitis C, Osteoarthritis, Neuropathy, Received Chemotherapy Photos Wound Measurements Length: (cm) 1.5 Width: (cm) 0.7 Depth: (cm) 0.2 Area: (cm) 0.825 Volume: (cm) 0.165 % Reduction in Area: 88.1% % Reduction in Volume: 88.1% Epithelialization: None Tunneling: No Undermining: No Wound Description Classification: Full Thickness Without Exposed Support Wound Margin: Distinct, outline attached Exudate Amount: Medium Exudate Type: Serosanguineous Exudate Color: red, brown Structures Foul Odor After Cleansing: No Slough/Fibrino Yes Wound Bed Granulation Amount: Large (67-100%) Exposed Structure Granulation Quality: Pink Fascia Exposed: No Necrotic Amount: Small (1-33%) Fat Layer (Subcutaneous Tissue) Exposed: Yes Necrotic Quality: Adherent Slough Tendon Exposed: No Muscle Exposed: No Joint Exposed: No Bone Exposed: No Treatment Notes Wound #12 (Ankle) Wound Laterality: Left, Medial, Distal Cleanser Soap and Water Discharge Instruction: Gently cleanse wound with antibacterial soap, rinse and pat dry prior to dressing wounds Bugay, Herbie [name] ([id number]) [phone].pdf Page 8 of 9 Wound Cleanser Discharge Instruction: Wash your hands with soap and water. Remove old dressing, discard into plastic bag and place into trash. Cleanse the wound with Wound Cleanser prior to applying a clean dressing using gauze sponges, not tissues or cotton  balls. Do not scrub or  use excessive force. Pat dry using gauze sponges, not tissue or cotton balls. Peri-Wound Care AandD Ointment Discharge Instruction: Apply AandD Ointment to areas of redness Topical Primary Dressing Hydrofera Blue Ready Transfer Foam, 4x5 (in/in) Discharge Instruction: Apply Hydrofera Blue Ready to wound bed as directed Secondary Dressing ABD Pad 5x9 (in/in) Discharge Instruction: Cover with ABD pad Secured With Kerlix Roll Sterile or Non-Sterile 6-ply 4.5x4 (yd/yd) Discharge Instruction: Apply Kerlix as directed Tubigrip Size D, 3x10 (in/yd) Discharge Instruction: DD Compression Wrap Compression Stockings Add-Ons Electronic Signature(s) Signed: 09/18/2022 11:31:26 AM By: Gretta Cool, BSN, RN, CWS, Kim RN, BSN Entered By: Gretta Cool, BSN, RN, CWS, Kim on 09/16/2022 08:36:56 -------------------------------------------------------------------------------- Wound Assessment Details Patient Name: Date of Service: Chad [name], Chad [name] 09/16/2022 8:30 A M Medical Record Number: [id number] Patient Account Number: [id number] Date of Birth/Sex: Treating RN: 21-Mar-1961 (63 y.o. Seward Meth Primary Care Bricelyn Freestone: [name] Other Clinician: Referring Aadil Sur: Treating Krishan Mcbreen/Extender: [name] in Treatment: 40 Wound Status Wound Number: 14 Primary Abrasion Etiology: Wound Location: Left, Proximal, Midline Lower Leg Wound Healed - Epithelialized Wounding Event: Gradually Appeared Status: Date Acquired: 09/02/2022 Comorbid Anemia, Chronic Obstructive Pulmonary Disease (COPD), Coronary Weeks Of Treatment: 2 History: Artery Disease, Peripheral Arterial Disease, Peripheral Venous Clustered Wound: No Disease, Hepatitis C, Osteoarthritis, Neuropathy, Received Chemotherapy Wound Measurements Length: (cm) Width: (cm) Depth: (cm) Area: (cm) Volume: (cm) 0 % Reduction in Area: 100% 0 % Reduction in Volume: 100% 0 Epithelialization: Large  (67-100%) 0 0 Wound Description Chad [name], Chad [name] ([id number]) Classification: Full Thickness Without Exposed Support Exudate Amount: None Present [phone].pdf Page 9 of 9 Structures Foul Odor After Cleansing: No Slough/Fibrino No Wound Bed Granulation Amount: None Present (0%) Exposed Structure Necrotic Amount: None Present (0%) Fascia Exposed: No Fat Layer (Subcutaneous Tissue) Exposed: Yes Tendon Exposed: No Muscle Exposed: No Joint Exposed: No Bone Exposed: No Treatment Notes Wound #14 (Lower Leg) Wound Laterality: Left, Midline, Proximal Cleanser Peri-Wound Care Topical Primary Dressing Secondary Dressing Secured With Compression Wrap Compression Stockings Add-Ons Electronic Signature(s) Signed: 09/16/2022 4:52:42 PM By: Rosalio Loud MSN RN CNS WTA Signed: 09/22/2022 9:50:02 AM By: [name] Entered By: [name] on 09/16/2022 08:49:50 -------------------------------------------------------------------------------- Vitals Details Patient Name: Date of Service: [name] 09/16/2022 8:30 A M Medical Record Number: [id number] Patient Account Number: [id number] Date of Birth/Sex: Treating RN: [birth year] (62 y.o. [name], [name] Primary Care Antoni Stefan: [name] Other Clinician: Referring Uri Turnbough: Treating Shyler Hamill/Extender: [name] in Treatment: 40 Vital Signs Time Taken: 08:30 Temperature (F): 97.6 Height (in): 69 Pulse (bpm): 85 Weight (lbs): 150 Respiratory Rate (breaths/min): 16 Body Mass Index (BMI): 22.1 Blood Pressure (mmHg): 120/79 Reference Range: 80 - 120 mg / dl Electronic Signature(s) Signed: 09/18/2022 11:31:26 AM By: Gretta Cool, BSN, RN, CWS, Kim RN, BSN Entered By: Gretta Cool, BSN, RN, CWS, Kim on 09/16/2022 08:30:22

## 2022-09-23 ENCOUNTER — Encounter: Payer: Medicaid Other | Admitting: Physician Assistant

## 2022-09-23 DIAGNOSIS — L97322 Non-pressure chronic ulcer of left ankle with fat layer exposed: Secondary | ICD-10-CM | POA: Diagnosis not present

## 2022-09-24 NOTE — Progress Notes (Addendum)
Chad [name] ([id number]) [id number] Page 1 of 12 Visit Report for 09/23/2022 Chief Complaint Document Details Patient Name: Date of Service: Chad [name], Chad [name] 09/23/2022 9:30 A M Medical Record Number: [id number] Patient Account Number: [id number] Date of Birth/Sex: Treating RN: Jul 21, 1961 (62 y.o. Seward Meth Primary Care Provider: [name] Other Clinician: Referring Provider: Treating Provider/Extender: [name] in Treatment: [phone] Information Obtained from: Patient Chief Complaint Left ankle ulcer Electronic Signature(s) Signed: 09/23/2022 8:59:46 AM By: Worthy Keeler PA-C Entered By: Worthy Keeler on 09/23/2022 08:59:46 -------------------------------------------------------------------------------- Debridement Details Patient Name: Date of Service: Chad [name] 09/23/2022 9:30 A M Medical Record Number: [id number] Patient Account Number: [id number] Date of Birth/Sex: Treating RN: 1961-03-16 (62 y.o. Seward Meth Primary Care Provider: [name] Other Clinician: Referring Provider: Treating Provider/Extender: [name] in Treatment: 41 Debridement Performed for Assessment: Wound #12 Left,Distal,Medial Ankle Performed By: Physician Tommie Sams., PA-C Debridement Type: Debridement Severity of Tissue Pre Debridement: Fat layer exposed Level of Consciousness (Pre-procedure): Awake and Alert Pre-procedure Verification/Time Out No Taken: Start Time: 09:49 T Area Debrided (L x W): otal 1.8 (cm) x 0.7 (cm) = 1.26 (cm) Tissue and other material debrided: Viable, Non-Viable, Slough, Subcutaneous, Slough Level: Skin/Subcutaneous Tissue Debridement Description: Excisional Instrument: Curette Bleeding: Minimum Hemostasis Achieved: Pressure Response to Treatment: Procedure was tolerated well Level of Consciousness (Post- Awake and Alert procedure): Chad [name], Chad [name] ([id number])  [id number] Page 2 of 12 Post Debridement Measurements of Total Wound Length: (cm) 1.8 Width: (cm) 0.7 Depth: (cm) 0.3 Volume: (cm) 0.297 Character of Wound/Ulcer Post Debridement: Stable Severity of Tissue Post Debridement: Fat layer exposed Post Procedure Diagnosis Same as Pre-procedure Electronic Signature(s) Signed: 09/23/2022 3:12:00 PM By: Rosalio Loud MSN RN CNS WTA Signed: 09/25/2022 5:15:32 PM By: Worthy Keeler PA-C Entered By: Rosalio Loud on 09/23/2022 09:50:14 -------------------------------------------------------------------------------- HPI Details Patient Name: Date of Service: Chad [name] 09/23/2022 9:30 A M Medical Record Number: [id number] Patient Account Number: [id number] Date of Birth/Sex: Treating RN: Dec 02, 1960 (62 y.o. Seward Meth Primary Care Provider: [name] Other Clinician: Referring Provider: Treating Provider/Extender: [name] in Treatment: 50 History of Present Illness HPI Description: 10/08/18 on evaluation today patient actually presents to our office for initial evaluation concerning wounds that he has of the bilateral lower extremities. 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 He also has bilateral venous stasis. He apparently has been homeless currently following discharge from the hospital apparently he has been placed at [facility] which is is a skilled nursing facility locally. Nonetheless fortunately he does not show any signs of infection at this time which is good news. In fact several of the wound actually appears to be showing some signs of improvement already in my pinion. There are a couple areas in the left leg in particular there likely gonna require some sharp debridement to help clear away some necrotic  tissue and help with more sufficient healing. No fevers, chills, nausea, or vomiting noted at this time. 10/15/18 on evaluation today patient actually appears to be doing very well in regard to his bilateral lower extremities. He's been tolerating the dressing changes without complication. Fortunately there does not appear to be any evidence of active infection at this time which is great news. Overall I'm actually very pleased with how this has progressed in just  one visits time. Readmission: 08/14/2020 upon evaluation today patient presents for re-evaluation here in our clinic. He is having issues with his left ankle region as well as his right toe and his right heel. He tells me that the toe and heel actually began as a area that was itching that he was scratching and then subsequently opened up into wounds. These may have been abscess areas I presume based on what I am seeing currently. With regard to his left ankle region he tells me this was a similar type occurrence although he does have venous stasis this very well may be more of a venous leg ulcer more than anything. Nonetheless I do believe that the patient would benefit from appropriate and aggressive wound care to try to help get things under better control here. He does have history of a stroke on the left side affecting him to some degree there that he is able to stand although he does have some residual weakness. Otherwise again the patient does have chronic venous insufficiency as previously noted. His arterial studies most recently obtained showed that he had an ABI on the right of 1.16 with a TBI of 0.52 and on the left and ABI of 1.14 with a TBI of 0.81. That was obtained on 06/19/2020. 08/28/2020 upon evaluation today patient appears to be doing decently well in regard to his wounds in general. He has been tolerating the dressing changes without complication. Fortunately there does not appear to be any signs of active infection which  is great news. With that being said I think the [facility] is doing a good job I would recommend that we likely continue with that currently. 09/11/2020 upon evaluation today patient's wounds did not appear to be doing too poorly but again he is not really showing signs of significant improvement with regard to any of the wounds on the right. None of them have Hydrofera Blue on them I am not exactly sure why this is not being followed as the facility did not contact us to let us know of any issues with obtaining dressings or otherwise. With that being said he is supposed to be using Hydrofera Blue on both of the wounds on the right foot as well as the ankle wound on the left side. 09/18/2020 upon evaluation today patient appears to be doing poorly with regard to his wounds. Again right now the left ankle in particular showed signs of extreme maceration. Apparently he was told by someone with staff at [place] they could not get the [facility]. With that being said this is something that is never been relayed to [place] one way or another. Also the patient subsequently has not supposed to have a border gauze dressing on. He should have an ABD pad and roll gauze to secure as this drains much too much just to have a border gauze dressing to cover. Nonetheless the fact that they are not using the appropriate dressing is directly causing deterioration of the left ankle wound it is significantly worse today compared to what it was previous. I did attempt to call [facility] while the patient was here I called three times and got no one to even pick up the phone. After this I had my for an office coordinator call and she was able to finally get through and leave a message with the D ON as of dictation of this note which is roughly about an hour and a RYLER, HEMPHILL ([id number]) [id number] Page 3 of  12 half later I still have not been able to speak with anyone  at the facility. 09/25/2020 upon evaluation today patient actually showing signs of good improvement which is excellent news. He has been tolerating the dressing changes without complication. Fortunately there is no signs of active infection which is great news. No fevers, chills, nausea, vomiting, or diarrhea. I do feel like the facility has been doing a much better job at taking care of him as far as the dressings are concerned. However the director of nursing never did call me back. 10/09/2020 upon evaluation today patient appears to be doing well with regard to his wound. The toe ulcer did require some debridement but the other 2 areas actually appear to be doing quite well. 10/19/2020 upon evaluation today patient actually appears to be doing very well in regard to his wounds. In fact the heel does appear to be completely healed. The toe is doing better in the medial ankle on the left is also doing better. Overall I think he is headed in the right direction. 10/26/2020 upon evaluation today patient appears to be doing well with regard to his wound. He is showing signs of improvement which is great news and overall I am very pleased with where things stand today. No fevers, chills, nausea, vomiting, or diarrhea. 11/02/2020 upon evaluation today patient appears to be doing well with regard to his wounds. He has been tolerating the dressing changes without complication overall I am extremely pleased with where things stand today. He in regard to the toe is almost completely healed and the medial ankle on the left is doing much better. 11/09/2020 upon evaluation today patient appears to be doing a little poorly in regard to his left medial ankle ulcer. Fortunately there does not appear to be any signs of systemic infection but unfortunately locally he does appear to be infected in fact he has blue-green drainage consistent with Pseudomonas. 11/16/2020 upon evaluation today patient appears to be doing well with  regard to his wound. It actually appears to be doing better. I did place him on gentamicin cream since the Cipro was actually resistant even though he was positive for Pseudomonas on culture. Overall I think that he does seem to be doing better though I am unsure whether or not they have actually been putting the cream on. The patient is not sure that we did talk to the nurse directly and she was going to initiate that treatment. Fortunately there does not appear to be any signs of active infection at this time. No fevers, chills, nausea, vomiting, or diarrhea. 4/28; the area on the right second toe is close to healed. Left medial ankle required debridement 12/07/2020 upon evaluation today patient appears to be doing well with regard to his wounds. In fact the right second toe appears to be completely healed which is great news. Fortunately there does not appear to be any signs of active infection at this time which is also great news. I think we can probably discontinue the gentamicin on top of everything else. 12/14/2020 upon evaluation today patient appears to be doing well with regard to his wound. He is making good progress and overall very pleased with where things stand today. There is no signs of active infection at this time which is great news. 12/28/2020 upon evaluation today patient appears to be doing well with regard to his wounds. He has been tolerating the dressing changes without complication. Fortunately there is no signs of active infection at  this time. No fevers, chills, nausea, vomiting, or diarrhea. 12/28/2020 upon evaluation today patient's wound bed actually showed signs of excellent improvement. He has great epithelization and granulation I do not see any signs of infection overall I am extremely pleased with where things stand at this point. No fevers, chills, nausea, vomiting, or diarrhea. 01/11/2021 upon evaluation today patient appears to be doing well with regard to his wound  on his leg. He has been tolerating the dressing changes without complication. Fortunately there does not appear to be any signs of active infection which is great news. No fevers, chills, nausea, vomiting, or diarrhea. 01/25/2021 upon evaluation today patient appears to be doing well with regard to his wound. He has been tolerating the dressing changes without complication. Fortunately the collagen seems to be doing a great job which is excellent news. No fevers, chills, nausea, vomiting, or diarrhea. 02/08/2021 upon evaluation today patient's wound is actually looking a little bit worse especially in the periwound compared to previous. Fortunately there does not appear to be any signs of infection which is great news with that being said he does have some irritation around the periphery of the wound which has me more concerned. He actually had a dressing on that had not been changed in 3 days. He also is supposed to have daily dressing changes. With regard to the dressing applied he had a silver alginate dressing and silver collagen is what is recommended and ordered. He also had no Desitin around the edges of the wound in the periwound region although that is on the order inspect to be done as well. In general I was very concerned I did contact [facility] actually spoke with Magda Paganini who is the scheduling individual and subsequently she stated that she would pass the information to the D ON apparently the D ON was not available to talk to me when I call today. 02/18/2021 upon evaluation today patient's wound is actually showing signs of improvement. Fortunately there does not appear to be any evidence of infection which is great news overall I am extremely pleased with where things stand today. No fevers, chills, nausea, vomiting, or diarrhea. 8/3; patient presents for 1 week follow-up. He has no issues or complaints today. He denies signs of infection. 03/11/2021 upon evaluation today patient  appears to be doing well with regard to his wound. He does have a little bit of slough noted on the surface of the wound but fortunately there does not appear to be any signs of active infection at this time. No fevers, chills, nausea, vomiting, or diarrhea. 03/18/2021 upon evaluation today patient appears to be doing well with regard to his wound. He has been tolerating the dressing changes without complication. There was a little irritation more proximal to where the wound was that was not noted last week but nonetheless this is very superficial just seems to be more irritation we just need to make sure to put a good amount of the zinc over the area in my opinion. Otherwise he does not seem to be doing significantly worse at all which is great news. 03/25/2021 upon evaluation today patient appears to be doing well with regard to his wound. He is going require some sharp debridement today to clear with some of the necrotic debris. I did perform this today without complication postdebridement wound bed appears to be doing much better this is great news. 04/08/2021 upon evaluation today patient appears to be doing decently well in regard to his wound  although the overall measurement is not significantly smaller compared to previous. It is gone down a little bit but still the facility continues to not really put the appropriate dressings in place in fact he was supposed to have collagen we think he probably had more of an allergy to At this point. Fortunately there does not appear to be any signs of active infection systemically though locally I do not see anything on initial visualization either as far as erythema or warmth. 04/15/2021 upon evaluation today patient appears to be doing well with regard to his wound. He is actually showing signs of improvement. I did place him on antibiotics last week, Cipro. He has been taking that 2 times a day and seems to be tolerating it very well. I do not see any  evidence of worsening and in fact the overall appearance of the wound is smaller today which is also great news. 9/26; left medial ankle chronic venous insufficiency wound is improved. Using Hydrofera Blue 10/10; left medial ankle chronic venous insufficiency. Wound has not changed much in appearance completely nonviable surface. Apparently there have been problems getting the right product on the wound at the facility although he came in with [facility] on today 05/14/2021 upon evaluation today patient appears to be doing well with regard to his wound. I think he is making progress here which is good news. Fortunately there does not appear to be any signs of active infection at this time. No fevers, chills, nausea, vomiting, or diarrhea. 05/20/2021 upon evaluation today patient appears to be doing well with regard to his wound. He is showing signs of good improvement which is great news. There does not appear to be any evidence of active infection which is also excellent news. No fevers, chills, nausea, vomiting, or diarrhea. 05/28/2021 upon evaluation today patient appears to be doing quite well. There does not appear to be any signs of active infection at this time which is great Chad [name], Chad [name] ([id number]) [id number] Page 4 of 12 news. Overall I am extremely pleased with where things stand today. I think he is headed in the right direction. 06/11/2021 upon evaluation today patient appears to be doing well with regard to his left ankle ulcer and poorly in regard to the toe ulcer on the second toe right foot. This appears to show signs of joint exposure. Apparently this has been present for 1 to 2 months although he kept forgetting to tell me about it. That is unfortunate as right now it definitely appears to be doing significantly worse than what I would like to see. There does not appear to be any signs of active infection systemically though locally I am concerned  about the possibility of infection the toe is quite red. Again no one from the facility ever contacted [place] to advise that this was going on in the interim either. 06/17/2021 upon evaluation today patient presents for follow-up I did review his x-ray which showed a navicular bone fracture I am unsure of the chronicity of this. Subsequently he also had osteomyelitis of the toe which was what I was more concerned about this did not show up on x-ray but did show up on the pathology scrapings. This was listed as acute osteomyelitis. Nonetheless at this point I think that the antibiotic treatment is the best regimen to go with currently. The patient is in agreement with that plan. Nonetheless he has initially 30 days of doxycycline off likely extend that towards the end of the treatment cycle  that will be around the middle of December for an additional 2 weeks. That all depends on how well he continues to heal. Nonetheless based on what I am seeing in the foot I did want a proceed with an MRI as well which I think will be helpful to identify if there is anything else that needs to be addressed from the standpoint of infection. 06/24/2021 upon evaluation today patient appears to be doing pretty well in regards to his wounds. I think both are actually showing signs of improvement which is good I did review his MRI today which did show signs of osteomyelitis of the middle and proximal phalanx on his right foot of the affected toe. With that being said this is actually showing signs of significant improvement today already with the antibiotic therapy I think the redness is also improved. Overall I think that we just need to give this some time with appropriate wound care we will see how things go potentially hyperbarics could be considered. 07/02/2021 upon inspection today patient actually appears to be doing well in regard to his left ankle which is getting very close to complete resolution of pleased in that  regard. Unfortunately he is continuing to have issues with his second toe right foot and this seems to still be very painful for him. Recommend he try something different from the standpoint of antibiotics. 07/15/2021 upon evaluation today patient appears to be doing actually pretty well in regard to his foot. This is actually showing signs of significant improvement which is great news. Overall I feel like the patient is improving both in regard to the second toe as well as the ankle on the left. With that being said the biggest issue that I do see currently is that he is needing to have a refill of the doxycycline that we previously treated him with. He also did see podiatry they are not going to recommend any amputation at this point since he seems to be doing quite well. For that reason we just need to keep things under control from an infection standpoint. 08/01/2021 upon evaluation today patient appears to be doing well with regard to his wound. He has been tolerating the dressing changes without complication. Fortunately there does not appear to be any evidence of active infection locally nor systemically at this point. In fact I think everything is doing excellent in fact his second toe on the right foot is almost healed and the ankle on the left ankle region is actually very close to being healed as well. 08/08/2021 upon evaluation today patient appears to be doing well with regard to his wound. He has been tolerating the dressing changes without complication. Fortunately I do not see any signs of active infection at this time. Readmission: 12-06-2021 upon evaluation today patient presents for reevaluation here in the clinic he does tell me that he was being seen in facility at [place] by a provider that was coming in. He is not sure who this was. He tells me however that the wound seems to have gotten worse even compared to where it was when we last saw him at this point. With that  being said I do believe that he is likely going need ongoing wound care here in the clinic and I do believe that we need to be the ones to frontline this since his wound does seem to be getting worse not better at this point. He voiced understanding. He is also in agreement with this plan  and feels more comfortable coming here she tells me. Patient's medical history really has not changed since his prior admission he was only gone since January. 12-27-2021 upon evaluation today patient appears to be doing well with regard to his wound they did run out of the [facility] so they did not put anything on just an ABD pad with gentamicin. Still we are seeing some signs of good improvement here with some new epithelization which is great news. 01-10-2022 upon evaluation today patient appears to be doing well with regard to his wounds and he is going require some sharp debridement but overall seems to be making good progress. Fortunately I do not see any evidence of active infection locally or systemically at this time which is great news. 01-24-2022 upon evaluation today patient appears to be doing well with regard to his wound. The facility actually came and dropped him off early and he had another appointment at the hospital and then they just brought him over here and this was still hours before his appointment this afternoon. For that reason we did do our best to work him in this morning and fortunately had some space to make this happen. With that being said patient's wound does seem to be making progress here and I am very pleased in that regard I do not see any signs of active infection locally or systemically at this time. 02-07-2022 patient appears to be doing well currently in regard to his wounds. In fact one of them the more proximal is healed the distal is still open but seems to be doing excellent. Fortunately I do not see any evidence of active infection locally or systemically at this time which  is great news. No fevers, chills, nausea, vomiting, or diarrhea. 7/27; left medial ankle venous. Improving per our intake nurse. We are using Hydrofera Blue under Tubigrip compression. They are changing that at his facility 03-06-2022 upon evaluation today patient appears to be doing well with regard to the his wound he is can require some sharp debridement but seems to be making excellent progress. Fortunately I do not see any evidence of active infection locally or systemically which is great news. 03-27-2022 upon evaluation today patient's wound is actually showing signs of excellent improvement. Fortunately I see no signs of active infection locally or systemically at this time which is great news. No fevers, chills, nausea, vomiting, or diarrhea. 04-21-2022 upon evaluation today patient appears to be doing excellent in regard to his wound in fact this is very close to resolution based on what I am seeing. I do not see any evidence of active infection locally or systemically at this time which is great news and overall I am extremely pleased with where we are today. 05-05-2022 upon evaluation today patient's wound actually appears to potentially be completely healed. Fortunately I do not see any evidence of active infection at this time which is great news and overall I am very pleased I think this needs a little time to toughen up but other than that I really do believe were doing quite well. He is very pleased to hear this its been a long time coming. 05-19-2022 upon evaluation today patient appears to be doing well currently in regard to his wound. He in fact we were hoping will be completely healed and closed but it still has a very tiny area right in the center which is still continuing to drain. Fortunately I do not see any signs of infection locally or systemically  which is great news. 11/6; this wound is close to completely" he comes in this week with some erythema at 1 edge of this which  looks like something was rubbing on the area. Other than that no evidence of infection 06-16-2022 upon evaluation today patient appears to be doing well currently in regard to his ankle ulcer. This is very close to being healed but still has a small area in the central portion which is not. Fortunately there does not appear to be any signs of infection locally or systemically which is great news. No fevers, chills, nausea, vomiting, or diarrhea. 06-30-2022 upon evaluation patient's wound pretty much appears to be almost completely closed. In fact it may even be closed but there are still a lot of skin irritation around the edges of the wound and I just do not feel great about releasing them yet I would like to continue to monitor this just a little bit longer before Chad [name], Chad [name] ([id number]) [id number] Page 5 of 12 getting to that point. He is not opposed to this and in fact is happy to continue to come in if need be in order to make sure that things are moving in the right direction he definitely does not want to backtrack. 07-14-2022 upon evaluation today patient appears to be doing well currently in regard to his wound. Has been tolerating the dressing changes without complication. Fortunately I see no evidence of active infection locally nor systemically which is great news. No fevers, chills, nausea, vomiting, or diarrhea. 08-11-2022 upon evaluation today patient appears to be doing a little worse than last time I saw him although it has been a month. Unfortunately he was not able to be seen due to the fact that he was quarantined in the facility with COVID. Subsequently he tells me that he mention to the facility staff several times about taking it off over the past several weeks but they continue to tell him that someone have to get in touch with [place] here although nobody ever did until Friday when the nurse manager called to have that documented in the communication  notes. With that being said unfortunately this means that he had a wrap on that he was supposed to have on for only 1 week for ended up being on four 1 month essentially. I am glad that there is nothing worse than what we see currently to be perfectly honest. 08-19-2022 upon evaluation today patient appears to be doing well currently in regard to his wounds. He has been tolerating the dressing changes without complication. This is actually smaller today which is great news after last week's reopening. Fortunately I do not see any evidence of infection locally nor systemically at this point. 08-26-2022 upon evaluation today patient appears to be doing well currently in regard to his wound. He has been tolerating the dressing changes without complication. Fortunately there does not appear to be any signs of active infection locally nor systemically which is great news. No fevers, chills, nausea, vomiting, or diarrhea. 09-02-2022 upon evaluation today patient's wound actually showing signs of excellent improvement this is measuring smaller and looking much better. I am very pleased with where we stand I do believe that we are headed in the right direction. 09-09-2022 upon evaluation today patient appears to be doing decently well in regard to his leg in general although there was some swelling this is the 1 thing that I am not too happy with based on what I see currently. Fortunately there does  not appear to be any signs of infection locally nor systemically at this point. 09-16-2022 upon evaluation today patient appears to be doing well currently in regard to his wound. He is actually showing signs of improvement he wore the Tubigrip this week and it significantly better compared to last week's evaluation. Fortunately I do not see any evidence of active infection locally or systemically which is great news. Overall I think that were headed in the right direction which is great news. In fact overall I think  this is the best that he has looked in some time. He does not like the Tubigrip but actually did extremely well with this in my opinion. I think he needs to continue to utilize this. 09-23-2022 upon evaluation today patient appears to be doing well currently in regard to his wound. He has been tolerating the dressing changes without complication. Fortunately there does not appear to be any signs of active infection locally nor systemically which is great news and in general I do feel like that we are headed in the right direction. He has been having trouble with the Tubigrip however we have gotten the facility to order compression socks he has not gotten them as of yet. Electronic Signature(s) Signed: 09/23/2022 9:54:45 AM By: Worthy Keeler PA-C Entered By: Worthy Keeler on 09/23/2022 09:54:45 -------------------------------------------------------------------------------- Physical Exam Details Patient Name: Date of Service: Chad [name], Chad [name] 09/23/2022 9:30 A M Medical Record Number: [id number] Patient Account Number: [id number] Date of Birth/Sex: Treating RN: [birth year] (61 y.o. Seward Meth Primary Care Provider: [name] Other Clinician: Referring Provider: Treating Provider/Extender: [name] in Treatment: 18 Constitutional Well-nourished and well-hydrated in no acute distress. Respiratory normal breathing without difficulty. Psychiatric this patient is able to make decisions and demonstrates good insight into disease process. Alert and Oriented x 3. pleasant and cooperative. Notes Upon inspection patient's wound bed actually showed signs of good granulation epithelization at this point. Fortunately I see no signs of active infection locally nor systemically which is great news and in general I think we are headed in the right direction. I am very pleased with what I see. Electronic Signature(s) Signed: 09/23/2022 9:54:59 AM By: Maxie Barb, [name] ([id number]) [id number] Page 6 of 12 Entered By: Worthy Keeler on 09/23/2022 09:54:59 -------------------------------------------------------------------------------- Physician Orders Details Patient Name: Date of Service: Chad [name], VANDENBOSCH 09/23/2022 9:30 A M Medical Record Number: [id number] Patient Account Number: [id number] Date of Birth/Sex: Treating RN: 11-05-1960 (62 y.o. 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 Bathing/ Shower/ Hygiene May shower with wound dressing protected with water repellent cover or cast protector. No tub bath. Anesthetic (Use 'Patient Medications' Section for Anesthetic Order Entry) Lidocaine applied to wound bed Edema Control - Lymphedema / Segmental Compressive Device / Other A wraps ce Patient to wear own compression stockings. Remove compression stockings every night before going to bed and put on every morning when getting up. - please order patient compression stockings 8-17m/hg ankle 23cm, calf 32cm, leg length  floor to knee 40cm Elevate legs to the level of the heart and pump ankles as often as possible Elevate leg(s) parallel to the floor when sitting. Medications-Please add to medication list. Other: - apply AandD ointment to leg, do not apply to wound Wound Treatment Wound #12 - Ankle Wound  Laterality: Left, Medial, Distal Cleanser: Soap and Water Every Other Day/30 Days Discharge Instructions: Gently cleanse wound with antibacterial soap, rinse and pat dry prior to dressing wounds Cleanser: Wound Cleanser Every Other Day/30 Days Discharge Instructions: Wash your hands with soap and water. Remove old dressing, discard into plastic bag and place into trash. Cleanse the wound with Wound Cleanser prior to applying a clean dressing using gauze sponges, not tissues or cotton balls. Do not scrub or use excessive force. Pat dry using gauze sponges, not tissue or cotton balls. Peri-Wound Care: AandD Ointment Every Other Day/30 Days Discharge Instructions: Apply AandD Ointment to areas of redness Prim Dressing: Hydrofera Blue Ready Transfer Foam, 4x5 (in/in) Every Other Day/30 Days ary Discharge Instructions: Apply Hydrofera Blue Ready to wound bed as directed Secondary Dressing: ABD Pad 5x9 (in/in) Every Other Day/30 Days Discharge Instructions: Cover with ABD pad Secured With: ACE WRAP - 33M ACE Elastic Bandage With VELCRO Brand Closure, 4 (in) Every Other Day/30 Days MAIRON, LICAUSI ([id number]) [id number] Page 7 of 12 Secured With: [employer] or Non-Sterile 6-ply 4.5x4 (yd/yd) Every Other Day/30 Days Discharge Instructions: Apply Kerlix as directed Electronic Signature(s) Signed: 09/23/2022 3:12:00 PM By: Rosalio Loud MSN RN CNS WTA Signed: 09/25/2022 5:15:32 PM By: Worthy Keeler PA-C Entered By: Rosalio Loud on 09/23/2022 09:55:24 -------------------------------------------------------------------------------- Problem List Details Patient Name: Date of Service: Chad [name], Chad [name] 09/23/2022 9:30 A M Medical Record Number: [id number] Patient Account Number: [id number] Date of Birth/Sex: Treating RN: [birth year] (61 y.o. 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 Seward Meth Primary Care Provider: [name] Other Clinician: Referring Provider: Treating Provider/Extender: [name] in Treatment: 51 Subjective Chief Complaint Information obtained from Patient Left ankle ulcer History of Present Illness (HPI) 10/08/18 on evaluation today patient actually presents to our office for initial evaluation concerning wounds that he has of the bilateral lower extremities. 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 He also has bilateral venous stasis. He apparently has been homeless currently following discharge from the hospital apparently he has been placed at [facility] which is is a skilled nursing  facility locally. Nonetheless fortunately he does not show  any signs of infection at this time which is good news. In fact several of the wound actually appears to be showing some signs of improvement already in my pinion. There are a couple areas in the left leg in particular there likely gonna require some sharp debridement to help clear away some necrotic tissue and help with more sufficient healing. No fevers, chills, nausea, or vomiting noted at this time. 10/15/18 on evaluation today patient actually appears to be doing very well in regard to his bilateral lower extremities. He's been tolerating the dressing changes without complication. Fortunately there does not appear to be any evidence of active infection at this time which is great news. Overall I'm actually very pleased with how this has progressed in just one visits time. Readmission: 08/14/2020 upon evaluation today patient presents for re-evaluation here in our clinic. He is having issues with his left ankle region as well as his right toe and his right heel. He tells me that the toe and heel actually began as a area that was itching that he was scratching and then subsequently opened up into wounds. These may have been abscess areas I presume based on what I am seeing currently. With regard to his left ankle region he tells me this was a similar type occurrence although he does have venous stasis this very well may be more of a venous leg ulcer more than anything. Nonetheless I do believe that the patient would benefit from appropriate and aggressive wound care to try to help get things under better control here. He does have history of a stroke on the left side affecting him to some degree there that he is able to stand although he does have some residual weakness. Otherwise again the patient does have chronic venous insufficiency as previously noted. His arterial studies most recently obtained showed that he had an ABI on the right of  1.16 with a TBI of 0.52 and on the left and ABI of 1.14 with a TBI of 0.81. That was obtained on 06/19/2020. 08/28/2020 upon evaluation today patient appears to be doing decently well in regard to his wounds in general. He has been tolerating the dressing changes without complication. Fortunately there does not appear to be any signs of active infection which is great news. With that being said I think the [facility] is doing a good job I would recommend that we likely continue with that currently. 09/11/2020 upon evaluation today patient's wounds did not appear to be doing too poorly but again he is not really showing signs of significant improvement with regard to any of the wounds on the right. None of them have Hydrofera Blue on them I am not exactly sure why this is not being followed as the facility did not contact us to let us know of any issues with obtaining dressings or otherwise. With that being said he is supposed to be using Hydrofera Blue on both of the wounds on the right foot as well as the ankle wound on the left side. 09/18/2020 upon evaluation today patient appears to be doing poorly with regard to his wounds. Again right now the left ankle in particular showed signs of extreme maceration. Apparently he was told by someone with staff at [facility] they could not get the [facility]. With that being said this is something that is never been relayed to [place] one way or another. Also the patient subsequently has not supposed to have a border gauze dressing on. He  should have an ABD pad and roll gauze to secure as this drains much too much just to have a border gauze dressing to cover. Nonetheless the fact that they are not using the appropriate dressing is directly causing deterioration of the left ankle wound it is significantly worse today compared to what it was previous. I did attempt to call [facility] while the patient was here I called three times and got no one  to even pick up the phone. After this I had my for an office coordinator call and she was able to finally get through and leave a message with the D ON as of dictation of this note which is roughly about an hour and a half later I still have not been able to speak with anyone at the facility. 09/25/2020 upon evaluation today patient actually showing signs of good improvement which is excellent news. He has been tolerating the dressing changes without complication. Fortunately there is no signs of active infection which is great news. No fevers, chills, nausea, vomiting, or diarrhea. I do feel like the facility has been doing a much better job at taking care of him as far as the dressings are concerned. However the director of nursing never did call me back. 10/09/2020 upon evaluation today patient appears to be doing well with regard to his wound. The toe ulcer did require some debridement but the other 2 areas actually appear to be doing quite well. 10/19/2020 upon evaluation today patient actually appears to be doing very well in regard to his wounds. In fact the heel does appear to be completely healed. The toe is doing better in the medial ankle on the left is also doing better. Overall I think he is headed in the right direction. 10/26/2020 upon evaluation today patient appears to be doing well with regard to his wound. He is showing signs of improvement which is great news and overall I am very pleased with where things stand today. No fevers, chills, nausea, vomiting, or diarrhea. 11/02/2020 upon evaluation today patient appears to be doing well with regard to his wounds. He has been tolerating the dressing changes without complication overall I am extremely pleased with where things stand today. He in regard to the toe is almost completely healed and the medial ankle on the left is doing much better. EQUAN, EISENZIMMER ([id number]) [id number] Page 9 of 12 11/09/2020 upon  evaluation today patient appears to be doing a little poorly in regard to his left medial ankle ulcer. Fortunately there does not appear to be any signs of systemic infection but unfortunately locally he does appear to be infected in fact he has blue-green drainage consistent with Pseudomonas. 11/16/2020 upon evaluation today patient appears to be doing well with regard to his wound. It actually appears to be doing better. I did place him on gentamicin cream since the Cipro was actually resistant even though he was positive for Pseudomonas on culture. Overall I think that he does seem to be doing better though I am unsure whether or not they have actually been putting the cream on. The patient is not sure that we did talk to the nurse directly and she was going to initiate that treatment. Fortunately there does not appear to be any signs of active infection at this time. No fevers, chills, nausea, vomiting, or diarrhea. 4/28; the area on the right second toe is close to healed. Left medial ankle required debridement 12/07/2020 upon evaluation today patient appears to be doing well  with regard to his wounds. In fact the right second toe appears to be completely healed which is great news. Fortunately there does not appear to be any signs of active infection at this time which is also great news. I think we can probably discontinue the gentamicin on top of everything else. 12/14/2020 upon evaluation today patient appears to be doing well with regard to his wound. He is making good progress and overall very pleased with where things stand today. There is no signs of active infection at this time which is great news. 12/28/2020 upon evaluation today patient appears to be doing well with regard to his wounds. He has been tolerating the dressing changes without complication. Fortunately there is no signs of active infection at this time. No fevers, chills, nausea, vomiting, or diarrhea. 12/28/2020 upon evaluation  today patient's wound bed actually showed signs of excellent improvement. He has great epithelization and granulation I do not see any signs of infection overall I am extremely pleased with where things stand at this point. No fevers, chills, nausea, vomiting, or diarrhea. 01/11/2021 upon evaluation today patient appears to be doing well with regard to his wound on his leg. He has been tolerating the dressing changes without complication. Fortunately there does not appear to be any signs of active infection which is great news. No fevers, chills, nausea, vomiting, or diarrhea. 01/25/2021 upon evaluation today patient appears to be doing well with regard to his wound. He has been tolerating the dressing changes without complication. Fortunately the collagen seems to be doing a great job which is excellent news. No fevers, chills, nausea, vomiting, or diarrhea. 02/08/2021 upon evaluation today patient's wound is actually looking a little bit worse especially in the periwound compared to previous. Fortunately there does not appear to be any signs of infection which is great news with that being said he does have some irritation around the periphery of the wound which has me more concerned. He actually had a dressing on that had not been changed in 3 days. He also is supposed to have daily dressing changes. With regard to the dressing applied he had a silver alginate dressing and silver collagen is what is recommended and ordered. He also had no Desitin around the edges of the wound in the periwound region although that is on the order inspect to be done as well. In general I was very concerned I did contact [facility] actually spoke with Magda Paganini who is the scheduling individual and subsequently she stated that she would pass the information to the D ON apparently the D ON was not available to talk to me when I call today. 02/18/2021 upon evaluation today patient's wound is actually showing signs of  improvement. Fortunately there does not appear to be any evidence of infection which is great news overall I am extremely pleased with where things stand today. No fevers, chills, nausea, vomiting, or diarrhea. 8/3; patient presents for 1 week follow-up. He has no issues or complaints today. He denies signs of infection. 03/11/2021 upon evaluation today patient appears to be doing well with regard to his wound. He does have a little bit of slough noted on the surface of the wound but fortunately there does not appear to be any signs of active infection at this time. No fevers, chills, nausea, vomiting, or diarrhea. 03/18/2021 upon evaluation today patient appears to be doing well with regard to his wound. He has been tolerating the dressing changes without complication. There was a little  irritation more proximal to where the wound was that was not noted last week but nonetheless this is very superficial just seems to be more irritation we just need to make sure to put a good amount of the zinc over the area in my opinion. Otherwise he does not seem to be doing significantly worse at all which is great news. 03/25/2021 upon evaluation today patient appears to be doing well with regard to his wound. He is going require some sharp debridement today to clear with some of the necrotic debris. I did perform this today without complication postdebridement wound bed appears to be doing much better this is great news. 04/08/2021 upon evaluation today patient appears to be doing decently well in regard to his wound although the overall measurement is not significantly smaller compared to previous. It is gone down a little bit but still the facility continues to not really put the appropriate dressings in place in fact he was supposed to have collagen we think he probably had more of an allergy to At this point. Fortunately there does not appear to be any signs of active infection systemically though locally I do  not see anything on initial visualization either as far as erythema or warmth. 04/15/2021 upon evaluation today patient appears to be doing well with regard to his wound. He is actually showing signs of improvement. I did place him on antibiotics last week, Cipro. He has been taking that 2 times a day and seems to be tolerating it very well. I do not see any evidence of worsening and in fact the overall appearance of the wound is smaller today which is also great news. 9/26; left medial ankle chronic venous insufficiency wound is improved. Using Hydrofera Blue 10/10; left medial ankle chronic venous insufficiency. Wound has not changed much in appearance completely nonviable surface. Apparently there have been problems getting the right product on the wound at the facility although he came in with [facility] on today 05/14/2021 upon evaluation today patient appears to be doing well with regard to his wound. I think he is making progress here which is good news. Fortunately there does not appear to be any signs of active infection at this time. No fevers, chills, nausea, vomiting, or diarrhea. 05/20/2021 upon evaluation today patient appears to be doing well with regard to his wound. He is showing signs of good improvement which is great news. There does not appear to be any evidence of active infection which is also excellent news. No fevers, chills, nausea, vomiting, or diarrhea. 05/28/2021 upon evaluation today patient appears to be doing quite well. There does not appear to be any signs of active infection at this time which is great news. Overall I am extremely pleased with where things stand today. I think he is headed in the right direction. 06/11/2021 upon evaluation today patient appears to be doing well with regard to his left ankle ulcer and poorly in regard to the toe ulcer on the second toe right foot. This appears to show signs of joint exposure. Apparently this has been present for 1  to 2 months although he kept forgetting to tell me about it. That is unfortunate as right now it definitely appears to be doing significantly worse than what I would like to see. There does not appear to be any signs of active infection systemically though locally I am concerned about the possibility of infection the toe is quite red. Again no one from the facility ever contacted  [place] to advise that this was going on in the interim either. 06/17/2021 upon evaluation today patient presents for follow-up I did review his x-ray which showed a navicular bone fracture I am unsure of the chronicity of this. Subsequently he also had osteomyelitis of the toe which was what I was more concerned about this did not show up on x-ray but did show up on the pathology scrapings. This was listed as acute osteomyelitis. Nonetheless at this point I think that the antibiotic treatment is the best regimen to go with currently. The patient is in agreement with that plan. Nonetheless he has initially 30 days of doxycycline off likely extend that towards the end of the treatment cycle that will be around the middle of December for an additional 2 weeks. That all depends on how well he continues to heal. Nonetheless based on what I am seeing in the foot I did want a proceed with an MRI as well which I think will be helpful to identify if there is anything else that needs to be addressed from the standpoint of infection. 06/24/2021 upon evaluation today patient appears to be doing pretty well in regards to his wounds. I think both are actually showing signs of improvement which is good I did review his MRI today which did show signs of osteomyelitis of the middle and proximal phalanx on his right foot of the affected toe. With that being said this is actually showing signs of significant improvement today already with the antibiotic therapy I think the redness is also improved. Overall I Chad [name], Chad [name] ([id number])  [id number] Page 10 of 12 think that we just need to give this some time with appropriate wound care we will see how things go potentially hyperbarics could be considered. 07/02/2021 upon inspection today patient actually appears to be doing well in regard to his left ankle which is getting very close to complete resolution of pleased in that regard. Unfortunately he is continuing to have issues with his second toe right foot and this seems to still be very painful for him. Recommend he try something different from the standpoint of antibiotics. 07/15/2021 upon evaluation today patient appears to be doing actually pretty well in regard to his foot. This is actually showing signs of significant improvement which is great news. Overall I feel like the patient is improving both in regard to the second toe as well as the ankle on the left. With that being said the biggest issue that I do see currently is that he is needing to have a refill of the doxycycline that we previously treated him with. He also did see podiatry they are not going to recommend any amputation at this point since he seems to be doing quite well. For that reason we just need to keep things under control from an infection standpoint. 08/01/2021 upon evaluation today patient appears to be doing well with regard to his wound. He has been tolerating the dressing changes without complication. Fortunately there does not appear to be any evidence of active infection locally nor systemically at this point. In fact I think everything is doing excellent in fact his second toe on the right foot is almost healed and the ankle on the left ankle region is actually very close to being healed as well. 08/08/2021 upon evaluation today patient appears to be doing well with regard to his wound. He has been tolerating the dressing changes without complication. Fortunately I do not see any signs of active infection at  this  time. Readmission: 12-06-2021 upon evaluation today patient presents for reevaluation here in the clinic he does tell me that he was being seen in facility at [facility] by a provider that was coming in. He is not sure who this was. He tells me however that the wound seems to have gotten worse even compared to where it was when we last saw him at this point. With that being said I do believe that he is likely going need ongoing wound care here in the clinic and I do believe that we need to be the ones to frontline this since his wound does seem to be getting worse not better at this point. He voiced understanding. He is also in agreement with this plan and feels more comfortable coming here she tells me. Patient's medical history really has not changed since his prior admission he was only gone since January. 12-27-2021 upon evaluation today patient appears to be doing well with regard to his wound they did run out of the [facility] so they did not put anything on just an ABD pad with gentamicin. Still we are seeing some signs of good improvement here with some new epithelization which is great news. 01-10-2022 upon evaluation today patient appears to be doing well with regard to his wounds and he is going require some sharp debridement but overall seems to be making good progress. Fortunately I do not see any evidence of active infection locally or systemically at this time which is great news. 01-24-2022 upon evaluation today patient appears to be doing well with regard to his wound. The facility actually came and dropped him off early and he had another appointment at the hospital and then they just brought him over here and this was still hours before his appointment this afternoon. For that reason we did do our best to work him in this morning and fortunately had some space to make this happen. With that being said patient's wound does seem to be making progress here and I am very pleased  in that regard I do not see any signs of active infection locally or systemically at this time. 02-07-2022 patient appears to be doing well currently in regard to his wounds. In fact one of them the more proximal is healed the distal is still open but seems to be doing excellent. Fortunately I do not see any evidence of active infection locally or systemically at this time which is great news. No fevers, chills, nausea, vomiting, or diarrhea. 7/27; left medial ankle venous. Improving per our intake nurse. We are using Hydrofera Blue under Tubigrip compression. They are changing that at his facility 03-06-2022 upon evaluation today patient appears to be doing well with regard to the his wound he is can require some sharp debridement but seems to be making excellent progress. Fortunately I do not see any evidence of active infection locally or systemically which is great news. 03-27-2022 upon evaluation today patient's wound is actually showing signs of excellent improvement. Fortunately I see no signs of active infection locally or systemically at this time which is great news. No fevers, chills, nausea, vomiting, or diarrhea. 04-21-2022 upon evaluation today patient appears to be doing excellent in regard to his wound in fact this is very close to resolution based on what I am seeing. I do not see any evidence of active infection locally or systemically at this time which is great news and overall I am extremely pleased with where we are today.  05-05-2022 upon evaluation today patient's wound actually appears to potentially be completely healed. Fortunately I do not see any evidence of active infection at this time which is great news and overall I am very pleased I think this needs a little time to toughen up but other than that I really do believe were doing quite well. He is very pleased to hear this its been a long time coming. 05-19-2022 upon evaluation today patient appears to be doing well currently  in regard to his wound. He in fact we were hoping will be completely healed and closed but it still has a very tiny area right in the center which is still continuing to drain. Fortunately I do not see any signs of infection locally or systemically which is great news. 11/6; this wound is close to completely" he comes in this week with some erythema at 1 edge of this which looks like something was rubbing on the area. Other than that no evidence of infection 06-16-2022 upon evaluation today patient appears to be doing well currently in regard to his ankle ulcer. This is very close to being healed but still has a small area in the central portion which is not. Fortunately there does not appear to be any signs of infection locally or systemically which is great news. No fevers, chills, nausea, vomiting, or diarrhea. 06-30-2022 upon evaluation patient's wound pretty much appears to be almost completely closed. In fact it may even be closed but there are still a lot of skin irritation around the edges of the wound and I just do not feel great about releasing them yet I would like to continue to monitor this just a little bit longer before getting to that point. He is not opposed to this and in fact is happy to continue to come in if need be in order to make sure that things are moving in the right direction he definitely does not want to backtrack. 07-14-2022 upon evaluation today patient appears to be doing well currently in regard to his wound. Has been tolerating the dressing changes without complication. Fortunately I see no evidence of active infection locally nor systemically which is great news. No fevers, chills, nausea, vomiting, or diarrhea. 08-11-2022 upon evaluation today patient appears to be doing a little worse than last time I saw him although it has been a month. Unfortunately he was not able to be seen due to the fact that he was quarantined in the facility with COVID. Subsequently he  tells me that he mention to the facility staff several times about taking it off over the past several weeks but they continue to tell him that someone have to get in touch with [place] here although nobody ever did until Friday when the nurse manager called to have that documented in the communication notes. With that being said unfortunately this means that he had a wrap on that he was supposed to have on for only 1 week for ended up being on four 1 month essentially. I am glad that there is nothing worse than what we see currently to be perfectly honest. 08-19-2022 upon evaluation today patient appears to be doing well currently in regard to his wounds. He has been tolerating the dressing changes without complication. This is actually smaller today which is great news after last week's reopening. Fortunately I do not see any evidence of infection locally nor systemically at this point. 08-26-2022 upon evaluation today patient appears to be doing well currently in regard  to his wound. He has been tolerating the dressing changes without complication. Fortunately there does not appear to be any signs of active infection locally nor systemically which is great news. No fevers, chills, nausea, Chad [name], Chad [name] ([id number]) [id number] Page 11 of 12 vomiting, or diarrhea. 09-02-2022 upon evaluation today patient's wound actually showing signs of excellent improvement this is measuring smaller and looking much better. I am very pleased with where we stand I do believe that we are headed in the right direction. 09-09-2022 upon evaluation today patient appears to be doing decently well in regard to his leg in general although there was some swelling this is the 1 thing that I am not too happy with based on what I see currently. Fortunately there does not appear to be any signs of infection locally nor systemically at this point. 09-16-2022 upon evaluation today patient appears to be doing  well currently in regard to his wound. He is actually showing signs of improvement he wore the Tubigrip this week and it significantly better compared to last week's evaluation. Fortunately I do not see any evidence of active infection locally or systemically which is great news. Overall I think that were headed in the right direction which is great news. In fact overall I think this is the best that he has looked in some time. He does not like the Tubigrip but actually did extremely well with this in my opinion. I think he needs to continue to utilize this. 09-23-2022 upon evaluation today patient appears to be doing well currently in regard to his wound. He has been tolerating the dressing changes without complication. Fortunately there does not appear to be any signs of active infection locally nor systemically which is great news and in general I do feel like that we are headed in the right direction. He has been having trouble with the Tubigrip however we have gotten the facility to order compression socks he has not gotten them as of yet. Objective Constitutional Well-nourished and well-hydrated in no acute distress. Vitals Time Taken: 9:32 AM, Height: 69 in, Weight: 150 lbs, BMI: 22.1, Temperature: 97.7 F, Pulse: 90 bpm, Respiratory Rate: 16 breaths/min, Blood Pressure: 128/76 mmHg. Respiratory normal breathing without difficulty. Psychiatric this patient is able to make decisions and demonstrates good insight into disease process. Alert and Oriented x 3. pleasant and cooperative. General Notes: Upon inspection patient's wound bed actually showed signs of good granulation epithelization at this point. Fortunately I see no signs of active infection locally nor systemically which is great news and in general I think we are headed in the right direction. I am very pleased with what I see. Integumentary (Hair, Skin) Wound #12 status is Open. Original cause of wound was Gradually Appeared. The  date acquired was: 07/12/2019. The wound has been in treatment 41 weeks. The wound is located on the Left,Distal,Medial Ankle. The wound measures 1.8cm length x 0.7cm width x 0.2cm depth; 0.99cm^2 area and 0.198cm^3 volume. There is Fat Layer (Subcutaneous Tissue) exposed. There is a medium amount of serosanguineous drainage noted. The wound margin is distinct with the outline attached to the wound base. There is large (67-100%) pink granulation within the wound bed. There is a small (1-33%) amount of necrotic tissue within the wound bed including Adherent Slough. 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 There was a Excisional Skin/Subcutaneous Tissue Debridement with a total area of 1.26 sq cm performed by Tommie Sams., PA-C. With the following instrument(s): Curette to remove Viable and Non-Viable tissue/material. Material removed includes Subcutaneous Tissue and Slough and. No specimens were taken.A Minimum amount of bleeding was controlled with Pressure. The procedure was tolerated well. Post Debridement Measurements: 1.8cm length x 0.7cm width x 0.3cm depth; 0.297cm^3 volume. Character of Wound/Ulcer Post Debridement is stable. Severity of Tissue Post Debridement is: Fat layer exposed. Post procedure Diagnosis Wound #12: Same as Pre-Procedure Plan BENTLEE, PACKETT ([id number]) [id number] Page 12 of 12 1. Recommend that we have the patient continue with the wound care measures  as before. He should be using some type of compression he should get a compression sock at the facility in the meantime since we have tried and failed everything else I will go ahead and utilize at this point and Ace wrap until he gets the compression sock. 2. I am good recommend that he continues otherwise with the dressing changes as before and this includes the use of AandD ointment around the edges of the wound followed by the [facility] this is keeping it from sticking which is doing a good job. We will see patient back for reevaluation in 1 week here in the clinic. If anything worsens or changes patient will contact our office for additional recommendations. Electronic Signature(s) Signed: 09/23/2022 9:55:32 AM By: Worthy Keeler PA-C Entered By: Worthy Keeler on 09/23/2022 09:55:32 -------------------------------------------------------------------------------- SuperBill Details Patient Name: Date of Service: JAVAUGHN, GERREN 09/23/2022 Medical Record Number: [id number] Patient Account Number: [id number] Date of Birth/Sex: Treating RN: 07/04/1961 (62 y.o. [place] Meth Primary Care Provider: [name] Other Clinician: Referring Provider: Treating Provider/Extender: [name] in Treatment: 41 Diagnosis Coding ICD-10 Codes Code Description [phone] Chronic venous hypertension (idiopathic) with ulcer and inflammation of left lower extremity 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 side Facility Procedures : CPT4 Code: [id number] Description: [place] - DEB SUBQ TISSUE 20 SQ CM/< ICD-10 Diagnosis Description L97.322 Non-pressure chronic ulcer of left ankle with fat layer exposed Modifier: Quantity: 1 Physician Procedures : CPT4 Code Description  Modifier [id number] - WC PHYS SUBQ TISS 20 SQ CM ICD-10 Diagnosis Description [id number] Non-pressure chronic ulcer of left ankle with fat layer exposed Quantity: 1 Electronic Signature(s) Signed: 09/23/2022 9:55:39 AM By: Worthy Keeler PA-C Entered By: Worthy Keeler on 09/23/2022 09:55:39

## 2022-09-24 NOTE — Progress Notes (Addendum)
Chad, [name] ([id number]) [id number] Page 1 of 8 Visit Report for 09/23/2022 Arrival Information Details Patient Name: Date of Service: Chad [name], Chad [name] 09/23/2022 9:30 A M Medical Record Number: [id number] Patient Account Number: [id number] Date of Birth/Sex: Treating RN: 03/11/1961 (62 y.o. Seward Meth Primary Care Christell Steinmiller: [name] Other Clinician: Referring Astella Desir: Treating Morey Andonian/Extender: [name] in Treatment: 2 Visit Information History Since Last Visit Added or deleted any medications: No Patient Arrived: Wheel Chair Has Dressing in Place as Prescribed: Yes Arrival Time: 09:24 Pain Present Now: No Accompanied By: self Transfer Assistance: None Patient Identification Verified: Yes Secondary Verification Process Completed: Yes Patient Requires Transmission-Based No Precautions: Patient Has Alerts: Yes Patient Alerts: Patient on Blood Thinner NOT diabetic aspirin '81mg'$  Lives [facility] Electronic Signature(s) Signed: 09/23/2022 3:12:00 PM By: Rosalio Loud MSN RN CNS WTA Entered By: Rosalio Loud on 09/23/2022 09:31:14 -------------------------------------------------------------------------------- Clinic Level of Care Assessment Details Patient Name: Date of Service: Chad [name], Chad [name] 09/23/2022 9:30 A M Medical Record Number: [id number] Patient Account Number: [id number] Date of Birth/Sex: Treating RN: August 04, 1960 (62 y.o. Seward Meth Primary Care Jatara Huettner: [name] Other Clinician: Referring Charlee Whitebread: Treating Quadarius Henton/Extender: [name] in Treatment: 41 Clinic Level of Care Assessment Items TOOL 1 Quantity Score '[]'$  - 0 Use when EandM and Procedure is performed on INITIAL visit ASSESSMENTS - Nursing Assessment / Reassessment '[]'$  - 0 General Physical Exam (combine w/ comprehensive assessment (listed just below) when performed on new pt. evals) '[]'$  -  0 Comprehensive Assessment (HX, ROS, Risk Assessments, Wounds Hx, etc.) LIGHT, FLORIAN ([id number]) [id number] Page 2 of 8 ASSESSMENTS - Wound and Skin Assessment / Reassessment '[]'$  - 0 Dermatologic / Skin Assessment (not related to wound area) ASSESSMENTS - Ostomy and/or Continence Assessment and Care '[]'$  - 0 Incontinence Assessment and Management '[]'$  - 0 Ostomy Care Assessment and Management (repouching, etc.) PROCESS - Coordination of Care '[]'$  - 0 Simple Patient / Family Education for ongoing care '[]'$  - 0 Complex (extensive) Patient / Family Education for ongoing care '[]'$  - 0 Staff obtains Programmer, systems, Records, T Results / Process Orders est '[]'$  - 0 Staff telephones HHA, Nursing Homes / Clarify orders / etc '[]'$  - 0 Routine Transfer to another Facility (non-emergent condition) '[]'$  - 0 Routine Hospital Admission (non-emergent condition) '[]'$  - 0 New Admissions / Biomedical engineer / Ordering NPWT Apligraf, etc. , '[]'$  - 0 Emergency Hospital Admission (emergent condition) PROCESS - Special Needs '[]'$  - 0 Pediatric / Minor Patient Management '[]'$  - 0 Isolation Patient Management '[]'$  - 0 Hearing / Language / Visual special needs '[]'$  - 0 Assessment of Community assistance (transportation, D/C planning, etc.) '[]'$  - 0 Additional assistance / Altered mentation '[]'$  - 0 Support Surface(s) Assessment (bed, cushion, seat, etc.) INTERVENTIONS - Miscellaneous '[]'$  - 0 External ear exam '[]'$  - 0 Patient Transfer (multiple staff / Civil Service fast streamer / Similar devices) '[]'$  - 0 Simple Staple / Suture removal (25 or less) '[]'$  - 0 Complex Staple / Suture removal (26 or more) '[]'$  - 0 Hypo/Hyperglycemic Management (do not check if billed separately) '[]'$  - 0 Ankle / Brachial Index (ABI) - do not check if billed separately Has the patient been seen at the hospital within the last three years: Yes Total Score: 0 Level Of Care: ____ Electronic Signature(s) Signed: 09/23/2022 3:12:00 PM  By: Rosalio Loud MSN RN CNS WTA Entered By: Rosalio Loud on 09/23/2022 09:55:32 -------------------------------------------------------------------------------- Encounter Discharge Information Details Patient Name: Date of Service: Chad [name]  09/23/2022 9:30 A M Medical Record Number: [id number] Patient Account Number: [id number] Date of Birth/Sex: Treating RN: [birth year] (62 y.o. Seward Meth Primary Care Robecca Fulgham: [name] Other Clinician: Referring Shirlena Brinegar: Treating Yaresly Menzel/Extender: [name] [place], Chad [name] ([id number]) [id number] Page 3 of 8 Weeks in Treatment: 13 Encounter Discharge Information Items Post Procedure Vitals Discharge Condition: Stable Temperature (F): 97.7 Ambulatory Status: Wheelchair Pulse (bpm): 90 Discharge Destination: Home Respiratory Rate (breaths/min): 16 Transportation: Private Auto Blood Pressure (mmHg): 128/76 Accompanied By: self Schedule Follow-up Appointment: Yes Clinical Summary of Care: Electronic Signature(s) Signed: 09/23/2022 3:12:00 PM By: Rosalio Loud MSN RN CNS WTA Entered By: Rosalio Loud on 09/23/2022 09:56:38 -------------------------------------------------------------------------------- Lower Extremity Assessment Details Patient Name: Date of Service: Chad [name], Chad [name] 09/23/2022 9:30 A M Medical Record Number: [id number] Patient Account Number: [id number] Date of Birth/Sex: Treating RN: 03/18/1961 (62 y.o. Seward Meth Primary Care Coltrane Tugwell: [name] Other Clinician: Referring Shayanne Gomm: Treating Mcgregor Tinnon/Extender: [name] in Treatment: 41 Edema Assessment Assessed: Shirlyn Goltz: No] Patrice Paradise: No] [Left: Edema] [Right: :] Calf Left: Right: Point of Measurement: 33 cm From Medial Instep 34 cm Ankle Left: Right: Point of Measurement: 12 cm From Medial Instep 23.6 cm Vascular Assessment Pulses: Dorsalis Pedis Palpable: [Left:Yes] Electronic  Signature(s) Signed: 09/23/2022 3:12:00 PM By: Rosalio Loud MSN RN CNS WTA Entered By: Rosalio Loud on 09/23/2022 09:39:57 [name] ([id number]) [id number] Page 4 of 8 -------------------------------------------------------------------------------- Multi Wound Chart Details Patient Name: Date of Service: Chad [name], Chad [name] 09/23/2022 9:30 A M Medical Record Number: [id number] Patient Account Number: [id number] Date of Birth/Sex: Treating RN: 16-Oct-1960 (62 y.o. Seward Meth Primary Care Durga Saldarriaga: [name] Other Clinician: Referring Novali Vollman: Treating Adelina Collard/Extender: [name] in Treatment: 41 Vital Signs Height(in): 69 Pulse(bpm): 90 Weight(lbs): 150 Blood Pressure(mmHg): 128/76 Body Mass Index(BMI): 22.1 Temperature(F): 97.7 Respiratory Rate(breaths/min): 16 [12:Photos:] [N/A:N/A] Left, Distal, Medial Ankle N/A N/A Wound Location: Gradually Appeared N/A N/A Wounding Event: Venous Leg Ulcer N/A N/A Primary Etiology: Anemia, Chronic Obstructive N/A N/A Comorbid History: Pulmonary Disease (COPD), Coronary Artery Disease, Peripheral Arterial Disease, Peripheral Venous Disease, Hepatitis C, Osteoarthritis, Neuropathy, Received Chemotherapy 07/12/2019 N/A N/A Date Acquired: 79 N/A N/A Weeks of Treatment: Open N/A N/A Wound Status: No N/A N/A Wound Recurrence: 1.8x0.7x0.2 N/A N/A Measurements L x W x D (cm) 0.99 N/A N/A A (cm) : rea 0.198 N/A N/A Volume (cm) : 85.70% N/A N/A % Reduction in Area: 85.70% N/A N/A % Reduction in Volume: Full Thickness Without Exposed N/A N/A Classification: Support Structures Medium N/A N/A Exudate Amount: Serosanguineous N/A N/A Exudate Type: red, brown N/A N/A Exudate Color: Distinct, outline attached N/A N/A Wound Margin: Large (67-100%) N/A N/A Granulation Amount: Pink N/A N/A Granulation Quality: Small (1-33%) N/A N/A Necrotic Amount: Fat Layer  (Subcutaneous Tissue): Yes N/A N/A Exposed Structures: Fascia: No Tendon: No Muscle: No Joint: No Bone: No None N/A N/A Epithelialization: Treatment Notes Electronic Signature(s) Signed: 09/23/2022 3:12:00 PM By: Rosalio Loud MSN RN CNS [address], Chad [name] [phone] By: Rosalio Loud MSN RN CNS Lissa Morales [id number].pdf Page 5 of 8 Signed: 09/23/2022 3:12:00 Entered By: Rosalio Loud on 09/23/2022 09:48:46 -------------------------------------------------------------------------------- Pain Assessment Details Patient Name: Date of Service: JOHNANDREW, HUCKER 09/23/2022 9:30 A M Medical Record Number: [id number] Patient Account Number: [id number] Date of Birth/Sex: Treating RN: 1961-05-23 (62 y.o. Seward Meth Primary Care Vermon Grays: [name] Other Clinician: Referring Bralynn Velador: Treating Denzal Meir/Extender: [name] in Treatment: 52 Active Problems Location of Pain Severity and Description of Pain  Patient Has Paino No Site Locations Pain Management and Medication Current Pain Management: Electronic Signature(s) Signed: 09/23/2022 3:12:00 PM By: Rosalio Loud MSN RN CNS WTA Entered By: Rosalio Loud on 09/23/2022 09:32:37 -------------------------------------------------------------------------------- Patient/Caregiver Education Details Patient Name: Date of Service: Chad [name], Chad [name] 2/27/2024andnbsp9:30 [employer] Record Number: [id number] Patient Account Number: [id number] Date of Birth/Gender: Treating RN: [birth year] (62 y.o. Landen, Amante, Chad [name] ([id number]) [id number] Page 6 of 8 Primary Care Physician: [name] Other Clinician: Referring Physician: Treating Physician/Extender: [name] in Treatment: 24 Education Assessment Education Provided To: Patient Education Topics Provided Wound/Skin Impairment: Handouts: Caring for Your Ulcer Methods:  Explain/Verbal Responses: State content correctly Electronic Signature(s) Signed: 09/23/2022 3:12:00 PM By: Rosalio Loud MSN RN CNS WTA Entered By: Rosalio Loud on 09/23/2022 09:46:29 -------------------------------------------------------------------------------- Wound Assessment Details Patient Name: Date of Service: Chad [name], Chad [name] 09/23/2022 9:30 A M Medical Record Number: [id number] Patient Account Number: [id number] Date of Birth/Sex: Treating RN: 1961/05/24 (62 y.o. Seward Meth Primary Care Dajana Gehrig: [name] Other Clinician: Referring Horice Carrero: Treating Zenna Traister/Extender: [name] in Treatment: 41 Wound Status Wound Number: 12 Primary Venous Leg Ulcer Etiology: Wound Location: Left, Distal, Medial Ankle Wound Open Wounding Event: Gradually Appeared Status: Date Acquired: 07/12/2019 Comorbid Anemia, Chronic Obstructive Pulmonary Disease (COPD), Coronary Weeks Of Treatment: 41 History: Artery Disease, Peripheral Arterial Disease, Peripheral Venous Clustered Wound: No Disease, Hepatitis C, Osteoarthritis, Neuropathy, Received Chemotherapy Photos Wound Measurements Length: (cm) 1.8 Width: (cm) 0.7 Depth: (cm) 0.2 Area: (cm) 0.99 Volume: (cm) 0.198 Renbarger, Lashon ([id number]) % Reduction in Area: 85.7% % Reduction in Volume: 85.7% Epithelialization: None [id number] Page 7 of 8 Wound Description Classification: Full Thickness Without Exposed Support Structures Wound Margin: Distinct, outline attached Exudate Amount: Medium Exudate Type: Serosanguineous Exudate Color: red, brown Foul Odor After Cleansing: No Slough/Fibrino Yes Wound Bed Granulation Amount: Large (67-100%) Exposed Structure Granulation Quality: Pink Fascia Exposed: No Necrotic Amount: Small (1-33%) Fat Layer (Subcutaneous Tissue) Exposed: Yes Necrotic Quality: Adherent Slough Tendon Exposed: No Muscle Exposed: No Joint Exposed:  No Bone Exposed: No Treatment Notes Wound #12 (Ankle) Wound Laterality: Left, Medial, Distal Cleanser Soap and Water Discharge Instruction: Gently cleanse wound with antibacterial soap, rinse and pat dry prior to dressing wounds Wound Cleanser Discharge Instruction: Wash your hands with soap and water. Remove old dressing, discard into plastic bag and place into trash. Cleanse the wound with Wound Cleanser prior to applying a clean dressing using gauze sponges, not tissues or cotton balls. Do not scrub or use excessive force. Pat dry using gauze sponges, not tissue or cotton balls. Peri-Wound Care AandD Ointment Discharge Instruction: Apply AandD Ointment to areas of redness Topical Primary Dressing Hydrofera Blue Ready Transfer Foam, 4x5 (in/in) Discharge Instruction: Apply Hydrofera Blue Ready to wound bed as directed Secondary Dressing ABD Pad 5x9 (in/in) Discharge Instruction: Cover with ABD pad Secured With ACE WRAP - 71M ACE Elastic Bandage With VELCRO Brand Closure, 4 (in) Kerlix Roll Sterile or Non-Sterile 6-ply 4.5x4 (yd/yd) Discharge Instruction: Apply Kerlix as directed Compression Wrap Compression Stockings Add-Ons Electronic Signature(s) Signed: 09/23/2022 3:12:00 PM By: Rosalio Loud MSN RN CNS WTA Entered By: Rosalio Loud on 09/23/2022 09:38:36 -------------------------------------------------------------------------------- Brewer Details Patient Name: Date of Service: Chad [name] 09/23/2022 9:30 A M Medical Record Number: [id number] Patient Account Number: [id number] JKWON, TESSMANN ([id number]) [id number] Page 8 of 8 Date of Birth/Sex: Treating RN: Jan 04, 1961 (62 y.o. Seward Meth Primary Care Kalon Erhardt: Other Clinician: [name] Referring Kelvis Berger: Treating Markie Frith/Extender: Joaquim Lai,  Clayburn Pert, Adam Phenix Weeks in Treatment: 41 Vital Signs Time Taken: 09:32 Temperature (F): 97.7 Height (in): 69 Pulse (bpm): 90 Weight  (lbs): 150 Respiratory Rate (breaths/min): 16 Body Mass Index (BMI): 22.1 Blood Pressure (mmHg): 128/76 Reference Range: 80 - 120 mg / dl Electronic Signature(s) Signed: 09/23/2022 3:12:00 PM By: Rosalio Loud MSN RN CNS WTA Entered By: Rosalio Loud on 09/23/2022 09:32:30

## 2022-09-30 ENCOUNTER — Encounter: Payer: Medicaid Other | Attending: Internal Medicine | Admitting: Internal Medicine

## 2022-09-30 DIAGNOSIS — I87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 hypertension (idiopathic) with ulcer and inflammation of left lower extremity: Secondary | ICD-10-CM | POA: Diagnosis present

## 2022-09-30 DIAGNOSIS — L89613 Pressure ulcer of right heel, stage 3: Secondary | ICD-10-CM | POA: Insufficient documentation

## 2022-09-30 DIAGNOSIS — L98492 Non-pressure chronic ulcer of skin of other sites with fat layer exposed: Secondary | ICD-10-CM | POA: Diagnosis not present

## 2022-09-30 DIAGNOSIS — L97322 Non-pressure chronic ulcer of left ankle with fat layer exposed: Secondary | ICD-10-CM | POA: Diagnosis not present

## 2022-09-30 DIAGNOSIS — I69354 Hemiplegia and hemiparesis following cerebral infarction affecting left non-dominant side: Secondary | ICD-10-CM | POA: Insufficient documentation

## 2022-10-03 NOTE — Progress Notes (Signed)
[name], [name] ([id number]) [id number] Page 1 of 11 Visit Report for 09/30/2022 HPI Details Patient Name: Date of Service: Chad [name], Chad [name] 09/30/2022 9:30 A M Medical Record Number: [id number] Patient Account Number: [id number] Date of Birth/Sex: Treating RN: 05-01-1961 (62 y.o. [place] Meth Primary Care Provider: [name] Other Clinician: Referring Provider: Treating Provider/Extender: Eldridge Dace, Anderson EL Haze Justin Weeks in Treatment: 42 History of Present Illness HPI Description: 10/08/18 on evaluation today patient actually presents to our office for initial evaluation concerning wounds that he has of the bilateral lower extremities. 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 He also has bilateral venous stasis. He apparently has been homeless currently following discharge from the hospital apparently he has been placed at [facility] which is is a skilled nursing facility locally. Nonetheless fortunately he does not show any signs of infection at this time which is good news. In fact several of the wound actually appears to be showing some signs of improvement already in my pinion. There are a couple areas in the left leg in particular there likely gonna require some sharp debridement to help clear away some necrotic tissue and help with more sufficient healing. No fevers, chills, nausea, or vomiting noted at this time. 10/15/18 on evaluation today patient actually appears to be doing very well in regard to his bilateral lower extremities. He's been tolerating the dressing changes without complication. Fortunately there does not appear to be any evidence of active infection at this time which is great news. Overall I'm actually very pleased with how this has progressed in just one visits  time. Readmission: 08/14/2020 upon evaluation today patient presents for re-evaluation here in our clinic. He is having issues with his left ankle region as well as his right toe and his right heel. He tells me that the toe and heel actually began as a area that was itching that he was scratching and then subsequently opened up into wounds. These may have been abscess areas I presume based on what I am seeing currently. With regard to his left ankle region he tells me this was a similar type occurrence although he does have venous stasis this very well may be more of a venous leg ulcer more than anything. Nonetheless I do believe that the patient would benefit from appropriate and aggressive wound care to try to help get things under better control here. He does have history of a stroke on the left side affecting him to some degree there that he is able to stand although he does have some residual weakness. Otherwise again the patient does have chronic venous insufficiency as previously noted. His arterial studies most recently obtained showed that he had an ABI on the right of 1.16 with a TBI of 0.52 and on the left and ABI of 1.14 with a TBI of 0.81. That was obtained on 06/19/2020. 08/28/2020 upon evaluation today patient appears to be doing decently well in regard to his wounds in general. He has been tolerating the dressing changes without complication. Fortunately there does not appear to be any signs of active infection which is great news. With that being said I think the [facility] is doing a good job I would recommend that we likely continue with that currently. 09/11/2020 upon evaluation today patient's wounds did not appear to be doing too poorly but  again he is not really showing signs of significant improvement with regard to any of the wounds on the right. None of them have Hydrofera Blue on them I am not exactly sure why this is not being followed as the facility did not contact us to  let us know of any issues with obtaining dressings or otherwise. With that being said he is supposed to be using Hydrofera Blue on both of the wounds on the right foot as well as the ankle wound on the left side. 09/18/2020 upon evaluation today patient appears to be doing poorly with regard to his wounds. Again right now the left ankle in particular showed signs of extreme maceration. Apparently he was told by someone with staff at [facility] they could not get the [facility]. With that being said this is something that is never been relayed to [place] one way or another. Also the patient subsequently has not supposed to have a border gauze dressing on. He should have an ABD pad and roll gauze to secure as this drains much too much just to have a border gauze dressing to cover. Nonetheless the fact that they are not using the appropriate dressing is directly causing deterioration of the left ankle wound it is significantly worse today compared to what it was previous. I did attempt to call [facility] while the patient was here I called three times and got no one to even pick up the phone. After this I had my for an office coordinator call and she was able to finally get through and leave a message with the D ON as of dictation of this note which is roughly about an hour and a half later I still have not been able to speak with anyone at the facility. 09/25/2020 upon evaluation today patient actually showing signs of good improvement which is excellent news. He has been tolerating the dressing changes without complication. Fortunately there is no signs of active infection which is great news. No fevers, chills, nausea, vomiting, or diarrhea. I do feel like the facility has been doing a much better job at taking care of him as far as the dressings are concerned. However the director of nursing never did call me back. 10/09/2020 upon evaluation today patient appears to be doing well with  regard to his wound. The toe ulcer did require some debridement but the other 2 areas actually appear to be doing quite well. 10/19/2020 upon evaluation today patient actually appears to be doing very well in regard to his wounds. In fact the heel does appear to be completely healed. The toe is doing better in the medial ankle on the left is also doing better. Overall I think he is headed in the right direction. 10/26/2020 upon evaluation today patient appears to be doing well with regard to his wound. He is showing signs of improvement which is great news and overall I am very pleased with where things stand today. No fevers, chills, nausea, vomiting, or diarrhea. 11/02/2020 upon evaluation today patient appears to be doing well with regard to his wounds. He has been tolerating the dressing changes without complication overall I am extremely pleased with where things stand today. He in regard to the toe is almost completely healed and the medial ankle on the left is doing much better. JAYKOB, Chad [name] ([id number]) [id number] Page 2 of 11 11/09/2020 upon evaluation today patient appears to be doing a little poorly in regard to his left medial ankle ulcer. Fortunately there  does not appear to be any signs of systemic infection but unfortunately locally he does appear to be infected in fact he has blue-green drainage consistent with Pseudomonas. 11/16/2020 upon evaluation today patient appears to be doing well with regard to his wound. It actually appears to be doing better. I did place him on gentamicin cream since the Cipro was actually resistant even though he was positive for Pseudomonas on culture. Overall I think that he does seem to be doing better though I am unsure whether or not they have actually been putting the cream on. The patient is not sure that we did talk to the nurse directly and she was going to initiate that treatment. Fortunately there does not appear to be  any signs of active infection at this time. No fevers, chills, nausea, vomiting, or diarrhea. 4/28; the area on the right second toe is close to healed. Left medial ankle required debridement 12/07/2020 upon evaluation today patient appears to be doing well with regard to his wounds. In fact the right second toe appears to be completely healed which is great news. Fortunately there does not appear to be any signs of active infection at this time which is also great news. I think we can probably discontinue the gentamicin on top of everything else. 12/14/2020 upon evaluation today patient appears to be doing well with regard to his wound. He is making good progress and overall very pleased with where things stand today. There is no signs of active infection at this time which is great news. 12/28/2020 upon evaluation today patient appears to be doing well with regard to his wounds. He has been tolerating the dressing changes without complication. Fortunately there is no signs of active infection at this time. No fevers, chills, nausea, vomiting, or diarrhea. 12/28/2020 upon evaluation today patient's wound bed actually showed signs of excellent improvement. He has great epithelization and granulation I do not see any signs of infection overall I am extremely pleased with where things stand at this point. No fevers, chills, nausea, vomiting, or diarrhea. 01/11/2021 upon evaluation today patient appears to be doing well with regard to his wound on his leg. He has been tolerating the dressing changes without complication. Fortunately there does not appear to be any signs of active infection which is great news. No fevers, chills, nausea, vomiting, or diarrhea. 01/25/2021 upon evaluation today patient appears to be doing well with regard to his wound. He has been tolerating the dressing changes without complication. Fortunately the collagen seems to be doing a great job which is excellent news. No fevers, chills,  nausea, vomiting, or diarrhea. 02/08/2021 upon evaluation today patient's wound is actually looking a little bit worse especially in the periwound compared to previous. Fortunately there does not appear to be any signs of infection which is great news with that being said he does have some irritation around the periphery of the wound which has me more concerned. He actually had a dressing on that had not been changed in 3 days. He also is supposed to have daily dressing changes. With regard to the dressing applied he had a silver alginate dressing and silver collagen is what is recommended and ordered. He also had no Desitin around the edges of the wound in the periwound region although that is on the order inspect to be done as well. In general I was very concerned I did contact [facility] actually spoke with Magda Paganini who is the scheduling individual and subsequently she stated that  she would pass the information to the D ON apparently the D ON was not available to talk to me when I call today. 02/18/2021 upon evaluation today patient's wound is actually showing signs of improvement. Fortunately there does not appear to be any evidence of infection which is great news overall I am extremely pleased with where things stand today. No fevers, chills, nausea, vomiting, or diarrhea. 8/3; patient presents for 1 week follow-up. He has no issues or complaints today. He denies signs of infection. 03/11/2021 upon evaluation today patient appears to be doing well with regard to his wound. He does have a little bit of slough noted on the surface of the wound but fortunately there does not appear to be any signs of active infection at this time. No fevers, chills, nausea, vomiting, or diarrhea. 03/18/2021 upon evaluation today patient appears to be doing well with regard to his wound. He has been tolerating the dressing changes without complication. There was a little irritation more proximal to where the wound  was that was not noted last week but nonetheless this is very superficial just seems to be more irritation we just need to make sure to put a good amount of the zinc over the area in my opinion. Otherwise he does not seem to be doing significantly worse at all which is great news. 03/25/2021 upon evaluation today patient appears to be doing well with regard to his wound. He is going require some sharp debridement today to clear with some of the necrotic debris. I did perform this today without complication postdebridement wound bed appears to be doing much better this is great news. 04/08/2021 upon evaluation today patient appears to be doing decently well in regard to his wound although the overall measurement is not significantly smaller compared to previous. It is gone down a little bit but still the facility continues to not really put the appropriate dressings in place in fact he was supposed to have collagen we think he probably had more of an allergy to At this point. Fortunately there does not appear to be any signs of active infection systemically though locally I do not see anything on initial visualization either as far as erythema or warmth. 04/15/2021 upon evaluation today patient appears to be doing well with regard to his wound. He is actually showing signs of improvement. I did place him on antibiotics last week, Cipro. He has been taking that 2 times a day and seems to be tolerating it very well. I do not see any evidence of worsening and in fact the overall appearance of the wound is smaller today which is also great news. 9/26; left medial ankle chronic venous insufficiency wound is improved. Using Hydrofera Blue 10/10; left medial ankle chronic venous insufficiency. Wound has not changed much in appearance completely nonviable surface. Apparently there have been problems getting the right product on the wound at the facility although he came in with [facility] on today 05/14/2021  upon evaluation today patient appears to be doing well with regard to his wound. I think he is making progress here which is good news. Fortunately there does not appear to be any signs of active infection at this time. No fevers, chills, nausea, vomiting, or diarrhea. 05/20/2021 upon evaluation today patient appears to be doing well with regard to his wound. He is showing signs of good improvement which is great news. There does not appear to be any evidence of active infection which is also excellent news. No  fevers, chills, nausea, vomiting, or diarrhea. 05/28/2021 upon evaluation today patient appears to be doing quite well. There does not appear to be any signs of active infection at this time which is great news. Overall I am extremely pleased with where things stand today. I think he is headed in the right direction. 06/11/2021 upon evaluation today patient appears to be doing well with regard to his left ankle ulcer and poorly in regard to the toe ulcer on the second toe right foot. This appears to show signs of joint exposure. Apparently this has been present for 1 to 2 months although he kept forgetting to tell me about it. That is unfortunate as right now it definitely appears to be doing significantly worse than what I would like to see. There does not appear to be any signs of active infection systemically though locally I am concerned about the possibility of infection the toe is quite red. Again no one from the facility ever contacted [place] to advise that this was going on in the interim either. 06/17/2021 upon evaluation today patient presents for follow-up I did review his x-ray which showed a navicular bone fracture I am unsure of the chronicity of this. Subsequently he also had osteomyelitis of the toe which was what I was more concerned about this did not show up on x-ray but did show up on the pathology scrapings. This was listed as acute osteomyelitis. Nonetheless at this point I think  that the antibiotic treatment is the best regimen to go with currently. The patient is in agreement with that plan. Nonetheless he has initially 30 days of doxycycline off likely extend that towards the end of the treatment cycle that will be around the middle of December for an additional 2 weeks. That all depends on how well he continues to heal. Nonetheless based on what I am seeing in the foot I did want a proceed with an MRI as well which I think will be helpful to identify if there is anything else that needs to be addressed from the standpoint of infection. 06/24/2021 upon evaluation today patient appears to be doing pretty well in regards to his wounds. I think both are actually showing signs of improvement which is good I did review his MRI today which did show signs of osteomyelitis of the middle and proximal phalanx on his right foot of the affected toe. With that being said this is actually showing signs of significant improvement today already with the antibiotic therapy I think the redness is also improved. Overall I ZYQUEZ, Chad [name] ([id number]) [id number] Page 3 of 11 think that we just need to give this some time with appropriate wound care we will see how things go potentially hyperbarics could be considered. 07/02/2021 upon inspection today patient actually appears to be doing well in regard to his left ankle which is getting very close to complete resolution of pleased in that regard. Unfortunately he is continuing to have issues with his second toe right foot and this seems to still be very painful for him. Recommend he try something different from the standpoint of antibiotics. 07/15/2021 upon evaluation today patient appears to be doing actually pretty well in regard to his foot. This is actually showing signs of significant improvement which is great news. Overall I feel like the patient is improving both in regard to the second toe as well as the  ankle on the left. With that being said the biggest issue that I do see currently is that  he is needing to have a refill of the doxycycline that we previously treated him with. He also did see podiatry they are not going to recommend any amputation at this point since he seems to be doing quite well. For that reason we just need to keep things under control from an infection standpoint. 08/01/2021 upon evaluation today patient appears to be doing well with regard to his wound. He has been tolerating the dressing changes without complication. Fortunately there does not appear to be any evidence of active infection locally nor systemically at this point. In fact I think everything is doing excellent in fact his second toe on the right foot is almost healed and the ankle on the left ankle region is actually very close to being healed as well. 08/08/2021 upon evaluation today patient appears to be doing well with regard to his wound. He has been tolerating the dressing changes without complication. Fortunately I do not see any signs of active infection at this time. Readmission: 12-06-2021 upon evaluation today patient presents for reevaluation here in the clinic he does tell me that he was being seen in facility at [place] by a provider that was coming in. He is not sure who this was. He tells me however that the wound seems to have gotten worse even compared to where it was when we last saw him at this point. With that being said I do believe that he is likely going need ongoing wound care here in the clinic and I do believe that we need to be the ones to frontline this since his wound does seem to be getting worse not better at this point. He voiced understanding. He is also in agreement with this plan and feels more comfortable coming here she tells me. Patient's medical history really has not changed since his prior admission he was only gone since January. 12-27-2021 upon evaluation today  patient appears to be doing well with regard to his wound they did run out of the [facility] so they did not put anything on just an ABD pad with gentamicin. Still we are seeing some signs of good improvement here with some new epithelization which is great news. 01-10-2022 upon evaluation today patient appears to be doing well with regard to his wounds and he is going require some sharp debridement but overall seems to be making good progress. Fortunately I do not see any evidence of active infection locally or systemically at this time which is great news. 01-24-2022 upon evaluation today patient appears to be doing well with regard to his wound. The facility actually came and dropped him off early and he had another appointment at the hospital and then they just brought him over here and this was still hours before his appointment this afternoon. For that reason we did do our best to work him in this morning and fortunately had some space to make this happen. With that being said patient's wound does seem to be making progress here and I am very pleased in that regard I do not see any signs of active infection locally or systemically at this time. 02-07-2022 patient appears to be doing well currently in regard to his wounds. In fact one of them the more proximal is healed the distal is still open but seems to be doing excellent. Fortunately I do not see any evidence of active infection locally or systemically at this time which is great news. No fevers, chills, nausea, vomiting, or diarrhea. 7/27;  left medial ankle venous. Improving per our intake nurse. We are using Hydrofera Blue under Tubigrip compression. They are changing that at his facility 03-06-2022 upon evaluation today patient appears to be doing well with regard to the his wound he is can require some sharp debridement but seems to be making excellent progress. Fortunately I do not see any evidence of active infection locally or  systemically which is great news. 03-27-2022 upon evaluation today patient's wound is actually showing signs of excellent improvement. Fortunately I see no signs of active infection locally or systemically at this time which is great news. No fevers, chills, nausea, vomiting, or diarrhea. 04-21-2022 upon evaluation today patient appears to be doing excellent in regard to his wound in fact this is very close to resolution based on what I am seeing. I do not see any evidence of active infection locally or systemically at this time which is great news and overall I am extremely pleased with where we are today. 05-05-2022 upon evaluation today patient's wound actually appears to potentially be completely healed. Fortunately I do not see any evidence of active infection at this time which is great news and overall I am very pleased I think this needs a little time to toughen up but other than that I really do believe were doing quite well. He is very pleased to hear this its been a long time coming. 05-19-2022 upon evaluation today patient appears to be doing well currently in regard to his wound. He in fact we were hoping will be completely healed and closed but it still has a very tiny area right in the center which is still continuing to drain. Fortunately I do not see any signs of infection locally or systemically which is great news. 11/6; this wound is close to completely" he comes in this week with some erythema at 1 edge of this which looks like something was rubbing on the area. Other than that no evidence of infection 06-16-2022 upon evaluation today patient appears to be doing well currently in regard to his ankle ulcer. This is very close to being healed but still has a small area in the central portion which is not. Fortunately there does not appear to be any signs of infection locally or systemically which is great news. No fevers, chills, nausea, vomiting, or diarrhea. 06-30-2022 upon  evaluation patient's wound pretty much appears to be almost completely closed. In fact it may even be closed but there are still a lot of skin irritation around the edges of the wound and I just do not feel great about releasing them yet I would like to continue to monitor this just a little bit longer before getting to that point. He is not opposed to this and in fact is happy to continue to come in if need be in order to make sure that things are moving in the right direction he definitely does not want to backtrack. 07-14-2022 upon evaluation today patient appears to be doing well currently in regard to his wound. Has been tolerating the dressing changes without complication. Fortunately I see no evidence of active infection locally nor systemically which is great news. No fevers, chills, nausea, vomiting, or diarrhea. 08-11-2022 upon evaluation today patient appears to be doing a little worse than last time I saw him although it has been a month. Unfortunately he was not able to be seen due to the fact that he was quarantined in the facility with COVID. Subsequently he tells me that  he mention to the facility staff several times about taking it off over the past several weeks but they continue to tell him that someone have to get in touch with [place] here although nobody ever did until Friday when the nurse manager called to have that documented in the communication notes. With that being said unfortunately this means that he had a wrap on that he was supposed to have on for only 1 week for ended up being on four 1 month essentially. I am glad that there is nothing worse than what we see currently to be perfectly honest. 08-19-2022 upon evaluation today patient appears to be doing well currently in regard to his wounds. He has been tolerating the dressing changes without complication. This is actually smaller today which is great news after last week's reopening. Fortunately I do not see any evidence of  infection locally nor systemically at this point. 08-26-2022 upon evaluation today patient appears to be doing well currently in regard to his wound. He has been tolerating the dressing changes without complication. Fortunately there does not appear to be any signs of active infection locally nor systemically which is great news. No fevers, chills, nausea, GREGORIE, BEHLER ([id number]) [phone].pdf Page 4 of 11 vomiting, or diarrhea. 09-02-2022 upon evaluation today patient's wound actually showing signs of excellent improvement this is measuring smaller and looking much better. I am very pleased with where we stand I do believe that we are headed in the right direction. 09-09-2022 upon evaluation today patient appears to be doing decently well in regard to his leg in general although there was some swelling this is the 1 thing that I am not too happy with based on what I see currently. Fortunately there does not appear to be any signs of infection locally nor systemically at this point. 09-16-2022 upon evaluation today patient appears to be doing well currently in regard to his wound. He is actually showing signs of improvement he wore the Tubigrip this week and it significantly better compared to last week's evaluation. Fortunately I do not see any evidence of active infection locally or systemically which is great news. Overall I think that were headed in the right direction which is great news. In fact overall I think this is the best that he has looked in some time. He does not like the Tubigrip but actually did extremely well with this in my opinion. I think he needs to continue to utilize this. 09-23-2022 upon evaluation today patient appears to be doing well currently in regard to his wound. He has been tolerating the dressing changes without complication. Fortunately there does not appear to be any signs of active infection locally nor systemically which is great news and  in general I do feel like that we are headed in the right direction. He has been having trouble with the Tubigrip however we have gotten the facility to order compression socks he has not gotten them as of yet. 3/4; patient is about the same with a wound on the left posterior leg. This is a venous wound. He needs more compression on this leg but he took off the previous wraps we are using. I am not certain whether he is using juxta lite stockings at the nursing home. Electronic Signature(s) Signed: 09/30/2022 4:03:34 PM By: Linton Ham MD Entered By: Linton Ham on 09/30/2022 10:41:05 -------------------------------------------------------------------------------- Physical Exam Details Patient Name: Date of Service: Chad [name] BERT 09/30/2022 9:30 A M Medical Record Number: [id number] Patient Account Number: [id number]  Date of Birth/Sex: Treating RN: 07/13/1961 (62 y.o. Seward Meth Primary Care Provider: [name] Other Clinician: Referring Provider: Treating Provider/Extender: RO BSO N, MICHA EL Haze Justin Weeks in Treatment: 42 Constitutional Sitting or standing Blood Pressure is within target range for patient.. Pulse regular and within target range for patient.Marland Kitchen Respirations regular, non-labored and within target range.. Temperature is normal and within the target range for the patient.Marland Kitchen appears in no distress. Notes Wound exam; the wound itself is not so bad the surrounding erythema and venous insufficiency is another story. I had wanted to give him a prescription for triamcinolone but they are already giving him something there. Electronic Signature(s) Signed: 09/30/2022 4:03:34 PM By: Linton Ham MD Entered By: Linton Ham on 09/30/2022 10:42:39 Physician Orders Details -------------------------------------------------------------------------------- [name] ([id number]) [id number] Page 5 of 11 Patient Name: Date of  Service: KHALLID, KNABB 09/30/2022 9:30 A M Medical Record Number: [id number] Patient Account Number: [id number] Date of Birth/Sex: Treating RN: [birth year] (62 y.o. Seward Meth Primary Care Provider: [name] Other Clinician: Referring Provider: Treating Provider/Extender: RO BSO Delane Ginger, MICHA EL Latina Craver in Treatment: [phone] Verbal / Phone Orders: No Diagnosis Coding Follow-up Appointments Return Appointment in 1 week. Bathing/ Shower/ Hygiene May shower with wound dressing protected with water repellent cover or cast protector. No tub bath. Anesthetic (Use 'Patient Medications' Section for Anesthetic Order Entry) Lidocaine applied to wound bed Edema Control - Lymphedema / Segmental Compressive Device / Other A wraps ce Patient to wear own compression stockings. Remove compression stockings every night before going to bed and put on every morning when getting up. - please order patient compression stockings 8-48m/hg ankle 23cm, calf 32cm, leg length floor to knee 40cm Elevate legs to the level of the heart and pump ankles as often as possible Elevate leg(s) parallel to the floor when sitting. Medications-Please add to medication list. Other: - apply AandD ointment to leg, do not apply to wound Wound Treatment Wound #12 - Ankle Wound Laterality: Left, Medial, Distal Cleanser: Soap and Water Every Other Day/30 Days Discharge Instructions: Gently cleanse wound with antibacterial soap, rinse and pat dry prior to dressing wounds Cleanser: Wound Cleanser Every Other Day/30 Days Discharge Instructions: Wash your hands with soap and water. Remove old dressing, discard into plastic bag and place into trash. Cleanse the wound with Wound Cleanser prior to applying a clean dressing using gauze sponges, not tissues or cotton balls. Do not scrub or use excessive force. Pat dry using gauze sponges, not tissue or cotton balls. Peri-Wound Care: AandD Ointment Every Other Day/30  Days Discharge Instructions: Apply AandD Ointment to areas of redness Prim Dressing: Hydrofera Blue Ready Transfer Foam, 4x5 (in/in) Every Other Day/30 Days ary Discharge Instructions: Apply Hydrofera Blue Ready to wound bed as directed Secondary Dressing: ABD Pad 5x9 (in/in) Every Other Day/30 Days Discharge Instructions: Cover with ABD pad Secured With: ACE WRAP - 66M ACE Elastic Bandage With VELCRO Brand Closure, 4 (in) Every Other Day/30 Days Secured With: KHartford FinancialSterile or Non-Sterile 6-ply 4.5x4 (yd/yd) Every Other Day/30 Days Discharge Instructions: Apply Kerlix as directed Electronic Signature(s) Signed: 09/30/2022 4:03:34 PM By: RLinton HamMD Signed: 10/01/2022 4:47:39 PM By: SRosalio LoudMSN RN CNS WTA Entered By: SRosalio Loudon 09/30/2022 10:15:49 Problem List Details -------------------------------------------------------------------------------- [name]([id number] Page 6 of 11 Patient Name: Date of Service: LLOVIS, CALISTO3/11/2022 9:30 A M Medical Record Number: [id number] Account Number: [id number] of Birth/Sex: Treating RN: 66May 04, 1962(  62 y.o. 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 Seward Meth Primary Care Provider: [name] Other Clinician: Referring Provider: Treating Provider/Extender: Eldridge Dace, [place] EL Haze Justin Weeks in Treatment: 42 Subjective History of Present Illness (HPI) 10/08/18 on evaluation today patient actually presents to our office for initial evaluation concerning wounds that he has of the bilateral lower extremities. 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 He also has bilateral venous stasis. He apparently has been homeless currently following discharge from the hospital apparently he has been placed at [facility] which is is a skilled nursing facility locally. Nonetheless fortunately he does not show any signs of infection at this time which is good news. In fact several of the wound actually appears to be showing some signs of improvement already in my pinion. There are a couple areas in the left leg in particular there likely gonna require some sharp debridement to help clear away some necrotic tissue and help with more sufficient healing. No fevers, chills, nausea, or vomiting noted at this time. 10/15/18 on evaluation today patient actually appears to be doing very well in regard to his bilateral lower extremities. He's been tolerating the dressing changes without complication. Fortunately there does not appear to be any evidence of active infection at this time which is great news. Overall I'm actually  very pleased with how this has progressed in just one visits time. ATA, ABBITT ([id number]) [id number] Page 7 of 11 Readmission: 08/14/2020 upon evaluation today patient presents for re-evaluation here in our clinic. He is having issues with his left ankle region as well as his right toe and his right heel. He tells me that the toe and heel actually began as a area that was itching that he was scratching and then subsequently opened up into wounds. These may have been abscess areas I presume based on what I am seeing currently. With regard to his left ankle region he tells me this was a similar type occurrence although he does have venous stasis this very well may be more of a venous leg ulcer more than anything. Nonetheless I do believe that the patient would benefit from appropriate and aggressive wound care to try to help get things under better control here. He does have history of a stroke on the left side affecting him to some degree there that he is able to stand although he does have some residual weakness. Otherwise again the patient does have chronic venous insufficiency as previously noted. His arterial  studies most recently obtained showed that he had an ABI on the right of 1.16 with a TBI of 0.52 and on the left and ABI of 1.14 with a TBI of 0.81. That was obtained on 06/19/2020. 08/28/2020 upon evaluation today patient appears to be doing decently well in regard to his wounds in general. He has been tolerating the dressing changes without complication. Fortunately there does not appear to be any signs of active infection which is great news. With that being said I think the [facility] is doing a good job I would recommend that we likely continue with that currently. 09/11/2020 upon evaluation today patient's wounds did not appear to be doing too poorly but again he is not really showing signs of significant improvement with regard to any of the wounds on  the right. None of them have Hydrofera Blue on them I am not exactly sure why this is not being followed as the facility did not contact us to let us know of any issues with obtaining dressings or otherwise. With that being said he is supposed to be using Hydrofera Blue on both of the wounds on the right foot as well as the ankle wound on the left side. 09/18/2020 upon evaluation today patient appears to be doing poorly with regard to his wounds. Again right now the left ankle in particular showed signs of extreme maceration. Apparently he was told by someone with staff at [facility] they could not get the [facility]. With that being said this is something that is never been relayed to [place] one way or another. Also the patient subsequently has not supposed to have a border gauze dressing on. He should have an ABD pad and roll gauze to secure as this drains much too much just to have a border gauze dressing to cover. Nonetheless the fact that they are not using the appropriate dressing is directly causing deterioration of the left ankle wound it is significantly worse today compared to what it was previous. I did attempt to call [facility] while the patient was here I called three times and got no one to even pick up the phone. After this I had my for an office coordinator call and she was able to finally get through and leave a message with the D ON as of dictation of this note which is roughly about an hour and a half later I still have not been able to speak with anyone at the facility. 09/25/2020 upon evaluation today patient actually showing signs of good improvement which is excellent news. He has been tolerating the dressing changes without complication. Fortunately there is no signs of active infection which is great news. No fevers, chills, nausea, vomiting, or diarrhea. I do feel like the facility has been doing a much better job at taking care of him as far as the dressings  are concerned. However the director of nursing never did call me back. 10/09/2020 upon evaluation today patient appears to be doing well with regard to his wound. The toe ulcer did require some debridement but the other 2 areas actually appear to be doing quite well. 10/19/2020 upon evaluation today patient actually appears to be doing very well in regard to his wounds. In fact the heel does appear to be completely healed. The toe is doing better in the medial ankle on the left is also doing better. Overall I think he is headed in the right direction. 10/26/2020 upon evaluation today patient appears to be  doing well with regard to his wound. He is showing signs of improvement which is great news and overall I am very pleased with where things stand today. No fevers, chills, nausea, vomiting, or diarrhea. 11/02/2020 upon evaluation today patient appears to be doing well with regard to his wounds. He has been tolerating the dressing changes without complication overall I am extremely pleased with where things stand today. He in regard to the toe is almost completely healed and the medial ankle on the left is doing much better. 11/09/2020 upon evaluation today patient appears to be doing a little poorly in regard to his left medial ankle ulcer. Fortunately there does not appear to be any signs of systemic infection but unfortunately locally he does appear to be infected in fact he has blue-green drainage consistent with Pseudomonas. 11/16/2020 upon evaluation today patient appears to be doing well with regard to his wound. It actually appears to be doing better. I did place him on gentamicin cream since the Cipro was actually resistant even though he was positive for Pseudomonas on culture. Overall I think that he does seem to be doing better though I am unsure whether or not they have actually been putting the cream on. The patient is not sure that we did talk to the nurse directly and she was going to initiate  that treatment. Fortunately there does not appear to be any signs of active infection at this time. No fevers, chills, nausea, vomiting, or diarrhea. 4/28; the area on the right second toe is close to healed. Left medial ankle required debridement 12/07/2020 upon evaluation today patient appears to be doing well with regard to his wounds. In fact the right second toe appears to be completely healed which is great news. Fortunately there does not appear to be any signs of active infection at this time which is also great news. I think we can probably discontinue the gentamicin on top of everything else. 12/14/2020 upon evaluation today patient appears to be doing well with regard to his wound. He is making good progress and overall very pleased with where things stand today. There is no signs of active infection at this time which is great news. 12/28/2020 upon evaluation today patient appears to be doing well with regard to his wounds. He has been tolerating the dressing changes without complication. Fortunately there is no signs of active infection at this time. No fevers, chills, nausea, vomiting, or diarrhea. 12/28/2020 upon evaluation today patient's wound bed actually showed signs of excellent improvement. He has great epithelization and granulation I do not see any signs of infection overall I am extremely pleased with where things stand at this point. No fevers, chills, nausea, vomiting, or diarrhea. 01/11/2021 upon evaluation today patient appears to be doing well with regard to his wound on his leg. He has been tolerating the dressing changes without complication. Fortunately there does not appear to be any signs of active infection which is great news. No fevers, chills, nausea, vomiting, or diarrhea. 01/25/2021 upon evaluation today patient appears to be doing well with regard to his wound. He has been tolerating the dressing changes without complication. Fortunately the collagen seems to be doing a  great job which is excellent news. No fevers, chills, nausea, vomiting, or diarrhea. 02/08/2021 upon evaluation today patient's wound is actually looking a little bit worse especially in the periwound compared to previous. Fortunately there does not appear to be any signs of infection which is great news with that being said  he does have some irritation around the periphery of the wound which has me more concerned. He actually had a dressing on that had not been changed in 3 days. He also is supposed to have daily dressing changes. With regard to the dressing applied he had a silver alginate dressing and silver collagen is what is recommended and ordered. He also had no Desitin around the edges of the wound in the periwound region although that is on the order inspect to be done as well. In general I was very concerned I did contact [facility] actually spoke with Magda Paganini who is the scheduling individual and subsequently she stated that she would pass the information to the D ON apparently the D ON was not available to talk to me when I call today. 02/18/2021 upon evaluation today patient's wound is actually showing signs of improvement. Fortunately there does not appear to be any evidence of infection which is great news overall I am extremely pleased with where things stand today. No fevers, chills, nausea, vomiting, or diarrhea. 8/3; patient presents for 1 week follow-up. He has no issues or complaints today. He denies signs of infection. 03/11/2021 upon evaluation today patient appears to be doing well with regard to his wound. He does have a little bit of slough noted on the surface of the wound but fortunately there does not appear to be any signs of active infection at this time. No fevers, chills, nausea, vomiting, or diarrhea. JAWONE, WIERZBOWSKI ([id number]) [id number] Page 8 of 11 03/18/2021 upon evaluation today patient appears to be doing well with regard to his  wound. He has been tolerating the dressing changes without complication. There was a little irritation more proximal to where the wound was that was not noted last week but nonetheless this is very superficial just seems to be more irritation we just need to make sure to put a good amount of the zinc over the area in my opinion. Otherwise he does not seem to be doing significantly worse at all which is great news. 03/25/2021 upon evaluation today patient appears to be doing well with regard to his wound. He is going require some sharp debridement today to clear with some of the necrotic debris. I did perform this today without complication postdebridement wound bed appears to be doing much better this is great news. 04/08/2021 upon evaluation today patient appears to be doing decently well in regard to his wound although the overall measurement is not significantly smaller compared to previous. It is gone down a little bit but still the facility continues to not really put the appropriate dressings in place in fact he was supposed to have collagen we think he probably had more of an allergy to At this point. Fortunately there does not appear to be any signs of active infection systemically though locally I do not see anything on initial visualization either as far as erythema or warmth. 04/15/2021 upon evaluation today patient appears to be doing well with regard to his wound. He is actually showing signs of improvement. I did place him on antibiotics last week, Cipro. He has been taking that 2 times a day and seems to be tolerating it very well. I do not see any evidence of worsening and in fact the overall appearance of the wound is smaller today which is also great news. 9/26; left medial ankle chronic venous insufficiency wound is improved. Using Hydrofera Blue 10/10; left medial ankle chronic venous insufficiency. Wound has not changed much in  appearance completely nonviable surface. Apparently  there have been problems getting the right product on the wound at the facility although he came in with [facility] on today 05/14/2021 upon evaluation today patient appears to be doing well with regard to his wound. I think he is making progress here which is good news. Fortunately there does not appear to be any signs of active infection at this time. No fevers, chills, nausea, vomiting, or diarrhea. 05/20/2021 upon evaluation today patient appears to be doing well with regard to his wound. He is showing signs of good improvement which is great news. There does not appear to be any evidence of active infection which is also excellent news. No fevers, chills, nausea, vomiting, or diarrhea. 05/28/2021 upon evaluation today patient appears to be doing quite well. There does not appear to be any signs of active infection at this time which is great news. Overall I am extremely pleased with where things stand today. I think he is headed in the right direction. 06/11/2021 upon evaluation today patient appears to be doing well with regard to his left ankle ulcer and poorly in regard to the toe ulcer on the second toe right foot. This appears to show signs of joint exposure. Apparently this has been present for 1 to 2 months although he kept forgetting to tell me about it. That is unfortunate as right now it definitely appears to be doing significantly worse than what I would like to see. There does not appear to be any signs of active infection systemically though locally I am concerned about the possibility of infection the toe is quite red. Again no one from the facility ever contacted [place] to advise that this was going on in the interim either. 06/17/2021 upon evaluation today patient presents for follow-up I did review his x-ray which showed a navicular bone fracture I am unsure of the chronicity of this. Subsequently he also had osteomyelitis of the toe which was what I was more concerned about this  did not show up on x-ray but did show up on the pathology scrapings. This was listed as acute osteomyelitis. Nonetheless at this point I think that the antibiotic treatment is the best regimen to go with currently. The patient is in agreement with that plan. Nonetheless he has initially 30 days of doxycycline off likely extend that towards the end of the treatment cycle that will be around the middle of December for an additional 2 weeks. That all depends on how well he continues to heal. Nonetheless based on what I am seeing in the foot I did want a proceed with an MRI as well which I think will be helpful to identify if there is anything else that needs to be addressed from the standpoint of infection. 06/24/2021 upon evaluation today patient appears to be doing pretty well in regards to his wounds. I think both are actually showing signs of improvement which is good I did review his MRI today which did show signs of osteomyelitis of the middle and proximal phalanx on his right foot of the affected toe. With that being said this is actually showing signs of significant improvement today already with the antibiotic therapy I think the redness is also improved. Overall I think that we just need to give this some time with appropriate wound care we will see how things go potentially hyperbarics could be considered. 07/02/2021 upon inspection today patient actually appears to be doing well in regard to his left ankle which  is getting very close to complete resolution of pleased in that regard. Unfortunately he is continuing to have issues with his second toe right foot and this seems to still be very painful for him. Recommend he try something different from the standpoint of antibiotics. 07/15/2021 upon evaluation today patient appears to be doing actually pretty well in regard to his foot. This is actually showing signs of significant improvement which is great news. Overall I feel like the patient is  improving both in regard to the second toe as well as the ankle on the left. With that being said the biggest issue that I do see currently is that he is needing to have a refill of the doxycycline that we previously treated him with. He also did see podiatry they are not going to recommend any amputation at this point since he seems to be doing quite well. For that reason we just need to keep things under control from an infection standpoint. 08/01/2021 upon evaluation today patient appears to be doing well with regard to his wound. He has been tolerating the dressing changes without complication. Fortunately there does not appear to be any evidence of active infection locally nor systemically at this point. In fact I think everything is doing excellent in fact his second toe on the right foot is almost healed and the ankle on the left ankle region is actually very close to being healed as well. 08/08/2021 upon evaluation today patient appears to be doing well with regard to his wound. He has been tolerating the dressing changes without complication. Fortunately I do not see any signs of active infection at this time. Readmission: 12-06-2021 upon evaluation today patient presents for reevaluation here in the clinic he does tell me that he was being seen in facility at [facility] by a provider that was coming in. He is not sure who this was. He tells me however that the wound seems to have gotten worse even compared to where it was when we last saw him at this point. With that being said I do believe that he is likely going need ongoing wound care here in the clinic and I do believe that we need to be the ones to frontline this since his wound does seem to be getting worse not better at this point. He voiced understanding. He is also in agreement with this plan and feels more comfortable coming here she tells me. Patient's medical history really has not changed since his prior admission he was  only gone since January. 12-27-2021 upon evaluation today patient appears to be doing well with regard to his wound they did run out of the [facility] so they did not put anything on just an ABD pad with gentamicin. Still we are seeing some signs of good improvement here with some new epithelization which is great news. 01-10-2022 upon evaluation today patient appears to be doing well with regard to his wounds and he is going require some sharp debridement but overall seems to be making good progress. Fortunately I do not see any evidence of active infection locally or systemically at this time which is great news. 01-24-2022 upon evaluation today patient appears to be doing well with regard to his wound. The facility actually came and dropped him off early and he had another appointment at the hospital and then they just brought him over here and this was still hours before his appointment this afternoon. For that reason we did do our best to  work him in this morning and fortunately had some space to make this happen. With that being said patient's wound does seem to be making progress here and I am very pleased in that regard I do not see any signs of active infection locally or systemically at this time. 02-07-2022 patient appears to be doing well currently in regard to his wounds. In fact one of them the more proximal is healed the distal is still open but seems to be doing excellent. Fortunately I do not see any evidence of active infection locally or systemically at this time which is great news. No fevers, chills, nausea, vomiting, or diarrhea. 7/27; left medial ankle venous. Improving per our intake nurse. We are using Hydrofera Blue under Tubigrip compression. They are changing that at his facility [facility] ([id number]) [id number] Page 9 of 11 03-06-2022 upon evaluation today patient appears to be doing well with regard to the his wound he is can require some  sharp debridement but seems to be making excellent progress. Fortunately I do not see any evidence of active infection locally or systemically which is great news. 03-27-2022 upon evaluation today patient's wound is actually showing signs of excellent improvement. Fortunately I see no signs of active infection locally or systemically at this time which is great news. No fevers, chills, nausea, vomiting, or diarrhea. 04-21-2022 upon evaluation today patient appears to be doing excellent in regard to his wound in fact this is very close to resolution based on what I am seeing. I do not see any evidence of active infection locally or systemically at this time which is great news and overall I am extremely pleased with where we are today. 05-05-2022 upon evaluation today patient's wound actually appears to potentially be completely healed. Fortunately I do not see any evidence of active infection at this time which is great news and overall I am very pleased I think this needs a little time to toughen up but other than that I really do believe were doing quite well. He is very pleased to hear this its been a long time coming. 05-19-2022 upon evaluation today patient appears to be doing well currently in regard to his wound. He in fact we were hoping will be completely healed and closed but it still has a very tiny area right in the center which is still continuing to drain. Fortunately I do not see any signs of infection locally or systemically which is great news. 11/6; this wound is close to completely" he comes in this week with some erythema at 1 edge of this which looks like something was rubbing on the area. Other than that no evidence of infection 06-16-2022 upon evaluation today patient appears to be doing well currently in regard to his ankle ulcer. This is very close to being healed but still has a small area in the central portion which is not. Fortunately there does not appear to be any signs of  infection locally or systemically which is great news. No fevers, chills, nausea, vomiting, or diarrhea. 06-30-2022 upon evaluation patient's wound pretty much appears to be almost completely closed. In fact it may even be closed but there are still a lot of skin irritation around the edges of the wound and I just do not feel great about releasing them yet I would like to continue to monitor this just a little bit longer before getting to that point. He is not opposed to this and in fact is happy to continue to come  in if need be in order to make sure that things are moving in the right direction he definitely does not want to backtrack. 07-14-2022 upon evaluation today patient appears to be doing well currently in regard to his wound. Has been tolerating the dressing changes without complication. Fortunately I see no evidence of active infection locally nor systemically which is great news. No fevers, chills, nausea, vomiting, or diarrhea. 08-11-2022 upon evaluation today patient appears to be doing a little worse than last time I saw him although it has been a month. Unfortunately he was not able to be seen due to the fact that he was quarantined in the facility with COVID. Subsequently he tells me that he mention to the facility staff several times about taking it off over the past several weeks but they continue to tell him that someone have to get in touch with [place] here although nobody ever did until Friday when the nurse manager called to have that documented in the communication notes. With that being said unfortunately this means that he had a wrap on that he was supposed to have on for only 1 week for ended up being on four 1 month essentially. I am glad that there is nothing worse than what we see currently to be perfectly honest. 08-19-2022 upon evaluation today patient appears to be doing well currently in regard to his wounds. He has been tolerating the dressing changes without complication.  This is actually smaller today which is great news after last week's reopening. Fortunately I do not see any evidence of infection locally nor systemically at this point. 08-26-2022 upon evaluation today patient appears to be doing well currently in regard to his wound. He has been tolerating the dressing changes without complication. Fortunately there does not appear to be any signs of active infection locally nor systemically which is great news. No fevers, chills, nausea, vomiting, or diarrhea. 09-02-2022 upon evaluation today patient's wound actually showing signs of excellent improvement this is measuring smaller and looking much better. I am very pleased with where we stand I do believe that we are headed in the right direction. 09-09-2022 upon evaluation today patient appears to be doing decently well in regard to his leg in general although there was some swelling this is the 1 thing that I am not too happy with based on what I see currently. Fortunately there does not appear to be any signs of infection locally nor systemically at this point. 09-16-2022 upon evaluation today patient appears to be doing well currently in regard to his wound. He is actually showing signs of improvement he wore the Tubigrip this week and it significantly better compared to last week's evaluation. Fortunately I do not see any evidence of active infection locally or systemically which is great news. Overall I think that were headed in the right direction which is great news. In fact overall I think this is the best that he has looked in some time. He does not like the Tubigrip but actually did extremely well with this in my opinion. I think he needs to continue to utilize this. 09-23-2022 upon evaluation today patient appears to be doing well currently in regard to his wound. He has been tolerating the dressing changes without complication. Fortunately there does not appear to be any signs of active infection locally  nor systemically which is great news and in general I do feel like that we are headed in the right direction. He has been having trouble with  the Tubigrip however we have gotten the facility to order compression socks he has not gotten them as of yet. 3/4; patient is about the same with a wound on the left posterior leg. This is a venous wound. He needs more compression on this leg but he took off the previous wraps we are using. I am not certain whether he is using juxta lite stockings at the nursing home. Objective Constitutional Sitting or standing Blood Pressure is within target range for patient.. Pulse regular and within target range for patient.Marland Kitchen Respirations regular, non-labored and within target range.. Temperature is normal and within the target range for the patient.Marland Kitchen appears in no distress. Vitals Time Taken: 10:00 AM, Height: 69 in, Weight: 150 lbs, BMI: 22.1, Temperature: 98.1 F, Pulse: 87 bpm, Respiratory Rate: 16 breaths/min, Blood Pressure: 125/84 mmHg. General Notes: Wound exam; the wound itself is not so bad the surrounding erythema and venous insufficiency is another story. I had wanted to give him a BLANCHE, LUCZAK ([id number]) [id number] Page 10 of 11 prescription for triamcinolone but they are already giving him something there. Integumentary (Hair, Skin) Wound #12 status is Open. Original cause of wound was Gradually Appeared. The date acquired was: 07/12/2019. The wound has been in treatment 42 weeks. The wound is located on the Left,Distal,Medial Ankle. The wound measures 2.3cm length x 0.4cm width x 0.2cm depth; 0.723cm^2 area and 0.145cm^3 volume. There is Fat Layer (Subcutaneous Tissue) exposed. There is a medium amount of serosanguineous drainage noted. The wound margin is distinct with the outline attached to the wound base. There is large (67-100%) pink granulation within the wound bed. There is a small (1-33%) amount of necrotic  tissue within the wound bed including Adherent Slough. 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 Bathing/ Shower/ Hygiene: May shower with wound dressing protected with water repellent cover or cast protector. No tub bath. Anesthetic (Use 'Patient Medications' Section for Anesthetic Order Entry): Lidocaine applied to wound bed Edema Control - Lymphedema / Segmental Compressive Device / Other: Ace wraps Patient to wear own compression stockings. Remove compression stockings every night before going to bed and put on every morning when getting up. - please order patient compression stockings 8-86m/hg ankle 23cm, calf 32cm, leg length floor to knee 40cm Elevate legs to the level of the heart and pump ankles as often as possible Elevate leg(s) parallel to the floor when sitting. Medications-Please add to medication list.: Other: - apply AandD ointment to leg, do not apply to wound WOUND #12: - Ankle Wound Laterality: Left, Medial, Distal Cleanser: Soap and Water Every Other Day/30 Days Discharge Instructions: Gently cleanse wound with antibacterial soap, rinse and pat dry prior to dressing wounds Cleanser: Wound Cleanser Every Other Day/30 Days Discharge Instructions: Wash your hands with soap and water. Remove old dressing, discard into plastic bag and place into trash. Cleanse the wound with Wound Cleanser prior to applying a clean dressing using gauze sponges, not tissues or cotton balls. Do not scrub or use excessive force. Pat dry using gauze sponges, not tissue or cotton balls. Peri-Wound Care: AandD Ointment Every  Other Day/30 Days Discharge Instructions: Apply AandD Ointment to areas of redness Prim Dressing: Hydrofera Blue Ready Transfer Foam, 4x5 (in/in)  Every Other Day/30 Days ary Discharge Instructions: Apply Hydrofera Blue Ready to wound bed as directed Secondary Dressing: ABD Pad 5x9 (in/in) Every Other Day/30 Days Discharge Instructions: Cover with ABD pad Secured With: ACE WRAP - 33M ACE Elastic Bandage With VELCRO Brand Closure, 4 (in) Every Other Day/30 Days Secured With: Kerlix Roll Sterile or Non-Sterile 6-ply 4.5x4 (yd/yd) Every Other Day/30 Days Discharge Instructions: Apply Kerlix as directed 1. #1 we are using Hydrofera Blue ABDs Ace wrap and I think he may be using Tubigrip's although I am not sure at this point. 2. He is certainly not elevating his legs Electronic Signature(s) Signed: 09/30/2022 4:03:34 PM By: Linton Ham MD Entered By: Linton Ham on 09/30/2022 10:43:42 [name] ([id number]) [id number] Page 11 of 11 -------------------------------------------------------------------------------- SuperBill Details Patient Name: Date of Service: LUAN, Chad [name] 09/30/2022 Medical Record Number: [id number] Patient Account Number: [id number] Date of Birth/Sex: Treating RN: [birth year] (62 y.o. [place] Meth Primary Care Provider: [name] Other Clinician: Referring Provider: Treating Provider/Extender: Eldridge Dace, [place] Haze Justin Weeks in Treatment: 42 Diagnosis Coding ICD-10 Codes Code Description [phone] Chronic venous hypertension (idiopathic) with ulcer and inflammation of left lower extremity 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 side Facility Procedures : CPT4 Code: [id number] Description: 99213 - WOUND CARE  VISIT-LEV 3 EST PT Modifier: Quantity: 1 Physician Procedures : CPT4 Code Description Modifier [id number] - WC PHYS LEVEL 3 - EST PT ICD-10 Diagnosis Description 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 (idiopathic) with ulcer and inflammation of left lower extremity L98.492 Non-pressure chronic ulcer of skin of  other sites with fat layer exposed L97.322 Non-pressure chronic ulcer of left ankle with fat layer exposed Quantity: 1 Electronic Signature(s) Signed: 09/30/2022 4:03:34 PM By: Linton Ham MD Entered By: Linton Ham on 09/30/2022 10:44:06

## 2022-10-03 NOTE — Progress Notes (Signed)
WINN, WEIAND ([id number]) [id number] Page 1 of 9 Visit Report for 09/30/2022 Arrival Information Details Patient Name: Date of Service: Chad [name], Chad [name] 09/30/2022 9:30 A M Medical Record Number: [id number] Patient Account Number: [id number] Date of Birth/Sex: Treating RN: 10/28/1960 (62 y.o. Seward Meth Primary Care Asia Favata: [name] Other Clinician: Referring Antwon Rochin: Treating Alleah Dearman/Extender: RO BSO Delane Ginger, MICHA EL Latina Craver in Treatment: 86 Visit Information History Since Last Visit Added or deleted any medications: No Patient Arrived: Wheel Chair Has Dressing in Place as Prescribed: Yes Arrival Time: 09:57 Pain Present Now: No Accompanied By: self Transfer Assistance: None Patient Identification Verified: Yes Secondary Verification Process Completed: Yes Patient Requires Transmission-Based No Precautions: Patient Has Alerts: Yes Patient Alerts: Patient on Blood Thinner NOT diabetic aspirin '81mg'$  Lives [facility] Electronic Signature(s) Signed: 10/01/2022 4:47:39 PM By: Rosalio Loud MSN RN CNS WTA Entered By: Rosalio Loud on 09/30/2022 10:00:39 -------------------------------------------------------------------------------- Clinic Level of Care Assessment Details Patient Name: Date of Service: Chad [name], Chad [name] 09/30/2022 9:30 A M Medical Record Number: [id number] Patient Account Number: [id number] Date of Birth/Sex: Treating RN: 1961-03-23 (62 y.o. Seward Meth Primary Care Trashaun Streight: [name] Other Clinician: Referring Tammela Bales: Treating Azura Tufaro/Extender: RO BSO N, [place] EL Haze Justin Weeks in Treatment: 42 Clinic Level of Care Assessment Items TOOL 4 Quantity Score X- 1 0 Use when only an EandM is performed on FOLLOW-UP visit ASSESSMENTS - Nursing Assessment / Reassessment X- 1 10 Reassessment of Co-morbidities (includes updates in patient status) X- 1 5 Reassessment of Adherence to Treatment  Plan Chad [name], Chad [name] ([id number]) [phone].pdf Page 2 of 9 ASSESSMENTS - Wound and Skin A ssessment / Reassessment X - Simple Wound Assessment / Reassessment - one wound 1 5 '[]'$  - 0 Complex Wound Assessment / Reassessment - multiple wounds '[]'$  - 0 Dermatologic / Skin Assessment (not related to wound area) ASSESSMENTS - Focused Assessment '[]'$  - 0 Circumferential Edema Measurements - multi extremities '[]'$  - 0 Nutritional Assessment / Counseling / Intervention '[]'$  - 0 Lower Extremity Assessment (monofilament, tuning fork, pulses) '[]'$  - 0 Peripheral Arterial Disease Assessment (using hand held doppler) ASSESSMENTS - Ostomy and/or Continence Assessment and Care '[]'$  - 0 Incontinence Assessment and Management '[]'$  - 0 Ostomy Care Assessment and Management (repouching, etc.) PROCESS - Coordination of Care X - Simple Patient / Family Education for ongoing care 1 15 '[]'$  - 0 Complex (extensive) Patient / Family Education for ongoing care X- 1 10 Staff obtains Programmer, systems, Records, T Results / Process Orders est '[]'$  - 0 Staff telephones HHA, Nursing Homes / Clarify orders / etc '[]'$  - 0 Routine Transfer to another Facility (non-emergent condition) '[]'$  - 0 Routine Hospital Admission (non-emergent condition) '[]'$  - 0 New Admissions / Biomedical engineer / Ordering NPWT Apligraf, etc. , '[]'$  - 0 Emergency Hospital Admission (emergent condition) X- 1 10 Simple Discharge Coordination '[]'$  - 0 Complex (extensive) Discharge Coordination PROCESS - Special Needs '[]'$  - 0 Pediatric / Minor Patient Management '[]'$  - 0 Isolation Patient Management '[]'$  - 0 Hearing / Language / Visual special needs '[]'$  - 0 Assessment of Community assistance (transportation, D/C planning, etc.) '[]'$  - 0 Additional assistance / Altered mentation '[]'$  - 0 Support Surface(s) Assessment (bed, cushion, seat, etc.) INTERVENTIONS - Wound Cleansing / Measurement X - Simple Wound Cleansing - one wound 1 5 '[]'$  -  0 Complex Wound Cleansing - multiple wounds X- 1 5 Wound Imaging (photographs - any number of wounds) '[]'$  - 0 Wound Tracing (instead of photographs) X- 1  5 Simple Wound Measurement - one wound '[]'$  - 0 Complex Wound Measurement - multiple wounds INTERVENTIONS - Wound Dressings X - Small Wound Dressing one or multiple wounds 1 10 '[]'$  - 0 Medium Wound Dressing one or multiple wounds '[]'$  - 0 Large Wound Dressing one or multiple wounds '[]'$  - 0 Application of Medications - topical '[]'$  - 0 Application of Medications - injection INTERVENTIONS - Miscellaneous '[]'$  - 0 External ear exam Chad [name], Chad [name] ([id number].pdf Page 3 of 9 '[]'$  - 0 Specimen Collection (cultures, biopsies, blood, body fluids, etc.) '[]'$  - 0 Specimen(s) / Culture(s) sent or taken to Lab for analysis '[]'$  - 0 Patient Transfer (multiple staff / Harrel Lemon Lift / Similar devices) '[]'$  - 0 Simple Staple / Suture removal (25 or less) '[]'$  - 0 Complex Staple / Suture removal (26 or more) '[]'$  - 0 Hypo / Hyperglycemic Management (close monitor of Blood Glucose) '[]'$  - 0 Ankle / Brachial Index (ABI) - do not check if billed separately '[]'$  - 0 Vital Signs Has the patient been seen at the hospital within the last three years: Yes Total Score: 80 Level Of Care: New/Established - Level 3 Electronic Signature(s) Signed: 10/01/2022 4:47:39 PM By: Rosalio Loud MSN RN CNS WTA Entered By: Rosalio Loud on 09/30/2022 10:17:44 -------------------------------------------------------------------------------- Encounter Discharge Information Details Patient Name: Date of Service: Chad [name] 09/30/2022 9:30 A M Medical Record Number: [id number] Patient Account Number: [id number] Date of Birth/Sex: Treating RN: 08-Mar-1961 (62 y.o. Seward Meth Primary Care Lizbett Garciagarcia: [name] Other Clinician: Referring Clinten Howk: Treating Jasmine Maceachern/Extender: RO BSO Delane Ginger, Ashley EL Latina Craver in Treatment: [phone] Encounter  Discharge Information Items Discharge Condition: Stable Ambulatory Status: Wheelchair Discharge Destination: Home Transportation: Other Accompanied By: self Schedule Follow-up Appointment: Yes Clinical Summary of Care: Electronic Signature(s) Signed: 10/01/2022 4:47:39 PM By: Rosalio Loud MSN RN CNS WTA Entered By: Rosalio Loud on 09/30/2022 10:19:17 Lower Extremity Assessment Details -------------------------------------------------------------------------------- Chad [name] ([id number]) [id number] Page 4 of 9 Patient Name: Date of Service: Chad [name], Chad [name] 09/30/2022 9:30 A M Medical Record Number: [id number] Patient Account Number: [id number] Date of Birth/Sex: Treating RN: 10-Mar-1961 (62 y.o. Seward Meth Primary Care Sid Greener: [name] Other Clinician: Referring Ashante Snelling: Treating Rodnisha Blomgren/Extender: RO BSO N, MICHA EL Haze Justin Weeks in Treatment: 42 Edema Assessment Left: Right: Assessed: Yes No Edema: Calf Left: Right: Point of Measurement: 33 cm From Medial Instep 35.5 cm Ankle Left: Right: Point of Measurement: 12 cm From Medial Instep 22.5 cm Vascular Assessment Left: Right: Pulses: Dorsalis Pedis Palpable: Yes Electronic Signature(s) Signed: 10/01/2022 4:47:39 PM By: Rosalio Loud MSN RN CNS WTA Entered By: Rosalio Loud on 09/30/2022 10:08:11 -------------------------------------------------------------------------------- Multi Wound Chart Details Patient Name: Date of Service: Chad [name] 09/30/2022 9:30 A M Medical Record Number: [id number] Patient Account Number: [id number] Date of Birth/Sex: Treating RN: [birth year] (62 y.o. Seward Meth Primary Care Zadrian Mccauley: [name] Other Clinician: Referring Lilliane Sposito: Treating Krrish Freund/Extender: RO BSO N, [place] EL Haze Justin Weeks in Treatment: 42 Vital Signs Height(in): 69 Pulse(bpm): 87 Weight(lbs): 150 Blood Pressure(mmHg): 125/84 Body Mass Index(BMI):  22.1 Temperature(F): 98.1 Respiratory Rate(breaths/min): 16 [12:Photos:] [N/A:N/A] Left, Distal, Medial Ankle N/A N/A Wound Location: Gradually Appeared N/A N/A Wounding Event: Chad [name] ([id number]) [id number] Page 5 of 9 Venous Leg Ulcer N/A N/A Primary Etiology: Anemia, Chronic Obstructive N/A N/A Comorbid History: Pulmonary Disease (COPD), Coronary Artery Disease, Peripheral Arterial Disease, Peripheral Venous Disease, Hepatitis C, Osteoarthritis, Neuropathy, Received Chemotherapy 07/12/2019 N/A N/A Date Acquired: 53 N/A N/A Weeks  of Treatment: Open N/A N/A Wound Status: No N/A N/A Wound Recurrence: 2.3x0.4x0.2 N/A N/A Measurements L x W x D (cm) 0.723 N/A N/A A (cm) : rea 0.145 N/A N/A Volume (cm) : 89.50% N/A N/A % Reduction in Area: 89.50% N/A N/A % Reduction in Volume: Full Thickness Without Exposed N/A N/A Classification: Support Structures Medium N/A N/A Exudate Amount: Serosanguineous N/A N/A Exudate Type: red, brown N/A N/A Exudate Color: Distinct, outline attached N/A N/A Wound Margin: Large (67-100%) N/A N/A Granulation Amount: Pink N/A N/A Granulation Quality: Small (1-33%) N/A N/A Necrotic Amount: Fat Layer (Subcutaneous Tissue): Yes N/A N/A Exposed Structures: Fascia: No Tendon: No Muscle: No Joint: No Bone: No None N/A N/A Epithelialization: Treatment Notes Electronic Signature(s) Signed: 10/01/2022 4:47:39 PM By: Rosalio Loud MSN RN CNS WTA Entered By: Rosalio Loud on 09/30/2022 10:14:58 -------------------------------------------------------------------------------- [place] Details Patient Name: Date of Service: Chad [name] 09/30/2022 9:30 A M Medical Record Number: [id number] Patient Account Number: [id number] Date of Birth/Sex: Treating RN: [birth year] (62 y.o. Seward Meth Primary Care Dann Galicia: [name] Other Clinician: Referring Jazzmyne Rasnick: Treating  Magdelyn Roebuck/Extender: RO BSO N, MICHA EL Haze Justin Weeks in Treatment: [phone] Active Inactive Venous Leg Ulcer Nursing Diagnoses: Knowledge deficit related to disease process and management Goals: Patient will maintain optimal edema control Date Initiated: 12/06/2021 Date Inactivated: 12/27/2021 Target Resolution Date: 01/03/2022 Goal Status: Met Patient/caregiver will verbalize understanding of disease process and disease management Date Initiated: 12/06/2021 Target Resolution Date: 09/27/2022 Goal Status: Active Chad [name], Chad [name] ([id number]) [phone].pdf Page 6 of 9 Interventions: Assess peripheral edema status every visit. Compression as ordered Notes: Electronic Signature(s) Signed: 10/01/2022 4:47:39 PM By: Rosalio Loud MSN RN CNS WTA Entered By: Rosalio Loud on 09/30/2022 10:17:56 -------------------------------------------------------------------------------- Pain Assessment Details Patient Name: Date of Service: Chad [name], Chad [name] 09/30/2022 9:30 A M Medical Record Number: [id number] Patient Account Number: [id number] Date of Birth/Sex: Treating RN: 11/15/1960 (62 y.o. Seward Meth Primary Care Helena Sardo: [name] Other Clinician: Referring Miller Edgington: Treating Andre Swander/Extender: RO BSO Delane Ginger, [place] EL Haze Justin Weeks in Treatment: 42 Active Problems Location of Pain Severity and Description of Pain Patient Has Paino No Site Locations Pain Management and Medication Current Pain Management: Electronic Signature(s) Signed: 10/01/2022 4:47:39 PM By: Rosalio Loud MSN RN CNS WTA Entered By: Rosalio Loud on 09/30/2022 10:01:06 Chad [name] ([id number]) [id number] Page 7 of 9 -------------------------------------------------------------------------------- Patient/Caregiver Education Details Patient Name: Date of Service: Chad [name], Chad [name] 3/5/2024andnbsp9:30 [place] Record Number: [id number] Patient Account Number:  [id number] Date of Birth/Gender: Treating RN: 02-Nov-1960 (62 y.o. Seward Meth Primary Care Physician: [name] Other Clinician: Referring Physician: Treating Physician/Extender: RO BSO Delane Ginger, [place] Weeks in Treatment: 1 Education Assessment Education Provided To: Patient Education Topics Provided Wound/Skin Impairment: Handouts: Caring for Your Ulcer Methods: Explain/Verbal Responses: State content correctly Electronic Signature(s) Signed: 10/01/2022 4:47:39 PM By: Rosalio Loud MSN RN CNS WTA Entered By: Rosalio Loud on 09/30/2022 10:18:14 -------------------------------------------------------------------------------- Wound Assessment Details Patient Name: Date of Service: Chad [name] 09/30/2022 9:30 A M Medical Record Number: [id number] Patient Account Number: [id number] Date of Birth/Sex: Treating RN: October 07, 1960 (62 y.o. Seward Meth Primary Care Amaurie Schreckengost: [name] Other Clinician: Referring Milderd Manocchio: Treating Jerelle Virden/Extender: RO BSO N, MICHA EL Haze Justin Weeks in Treatment: 42 Wound Status Wound Number: 12 Primary Venous Leg Ulcer Etiology: Wound Location: Left, Distal, Medial Ankle Wound Open Wounding Event: Gradually Appeared Status: Date Acquired: 07/12/2019 Comorbid Anemia, Chronic Obstructive Pulmonary Disease (COPD), Coronary Weeks Of  Treatment: 42 History: Artery Disease, Peripheral Arterial Disease, Peripheral Venous Clustered Wound: No Disease, Hepatitis C, Osteoarthritis, Neuropathy, Received Chemotherapy Photos Chad [name], Chad [name] ([id number]) [id number] Page 8 of 9 Wound Measurements Length: (cm) 2.3 Width: (cm) 0.4 Depth: (cm) 0.2 Area: (cm) 0.723 Volume: (cm) 0.145 % Reduction in Area: 89.5% % Reduction in Volume: 89.5% Epithelialization: None Wound Description Classification: Full Thickness Without Exposed Support Structures Wound Margin: Distinct, outline attached Exudate  Amount: Medium Exudate Type: Serosanguineous Exudate Color: red, brown Foul Odor After Cleansing: No Slough/Fibrino Yes Wound Bed Granulation Amount: Large (67-100%) Exposed Structure Granulation Quality: Pink Fascia Exposed: No Necrotic Amount: Small (1-33%) Fat Layer (Subcutaneous Tissue) Exposed: Yes Necrotic Quality: Adherent Slough Tendon Exposed: No Muscle Exposed: No Joint Exposed: No Bone Exposed: No Treatment Notes Wound #12 (Ankle) Wound Laterality: Left, Medial, Distal Cleanser Soap and Water Discharge Instruction: Gently cleanse wound with antibacterial soap, rinse and pat dry prior to dressing wounds Wound Cleanser Discharge Instruction: Wash your hands with soap and water. Remove old dressing, discard into plastic bag and place into trash. Cleanse the wound with Wound Cleanser prior to applying a clean dressing using gauze sponges, not tissues or cotton balls. Do not scrub or use excessive force. Pat dry using gauze sponges, not tissue or cotton balls. Peri-Wound Care AandD Ointment Discharge Instruction: Apply AandD Ointment to areas of redness Topical Primary Dressing Hydrofera Blue Ready Transfer Foam, 4x5 (in/in) Discharge Instruction: Apply Hydrofera Blue Ready to wound bed as directed Secondary Dressing ABD Pad 5x9 (in/in) Discharge Instruction: Cover with ABD pad Secured With ACE WRAP - 16M ACE Elastic Bandage With VELCRO Brand Closure, 4 (in) Kerlix Roll Sterile or Non-Sterile 6-ply 4.5x4 (yd/yd) Discharge Instruction: Apply Kerlix as directed Compression Wrap Compression Stockings Add-Ons Electronic Signature(s) Signed: 10/01/2022 4:47:39 PM By: Rosalio Loud MSN RN CNS [address], [name] ([id number]) By: Rosalio Loud MSN RN CNS Lissa Morales [phone].pdf Page 9 of 9 Signed: 10/01/2022 4:47:39 PM Entered By: Rosalio Loud on 09/30/2022 10:06:33 -------------------------------------------------------------------------------- Vitals  Details Patient Name: Date of Service: Chad [name], Chad [name] 09/30/2022 9:30 A M Medical Record Number: [id number] Patient Account Number: [id number] Date of Birth/Sex: Treating RN: [birth year] (62 y.o. Seward Meth Primary Care Keyara Ent: [name] Other Clinician: Referring Aislyn Hayse: Treating Jannetta Massey/Extender: RO BSO N, [place] EL Haze Justin Weeks in Treatment: 42 Vital Signs Time Taken: 10:00 Temperature (F): 98.1 Height (in): 69 Pulse (bpm): 87 Weight (lbs): 150 Respiratory Rate (breaths/min): 16 Body Mass Index (BMI): 22.1 Blood Pressure (mmHg): 125/84 Reference Range: 80 - 120 mg / dl Electronic Signature(s) Signed: 10/01/2022 4:47:39 PM By: Rosalio Loud MSN RN CNS WTA Entered By: Rosalio Loud on 09/30/2022 10:01:00

## 2022-10-07 ENCOUNTER — Encounter (HOSPITAL_BASED_OUTPATIENT_CLINIC_OR_DEPARTMENT_OTHER): Payer: Medicaid Other | Admitting: Internal Medicine

## 2022-10-07 DIAGNOSIS — I87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 hypertension (idiopathic) with ulcer and inflammation of left lower extremity: Secondary | ICD-10-CM | POA: Diagnosis not present

## 2022-10-07 DIAGNOSIS — L97322 Non-pressure chronic ulcer of left ankle with fat layer exposed: Secondary | ICD-10-CM

## 2022-10-07 DIAGNOSIS — L98492 Non-pressure chronic ulcer of skin of other sites with fat layer exposed: Secondary | ICD-10-CM | POA: Diagnosis not present

## 2022-10-07 DIAGNOSIS — L89613 Pressure ulcer of right heel, stage 3: Secondary | ICD-10-CM | POA: Diagnosis not present

## 2022-10-09 ENCOUNTER — Inpatient Hospital Stay (HOSPITAL_BASED_OUTPATIENT_CLINIC_OR_DEPARTMENT_OTHER): Payer: Medicaid Other | Admitting: Internal Medicine

## 2022-10-09 ENCOUNTER — Inpatient Hospital Stay: Payer: Medicaid Other | Attending: Internal Medicine

## 2022-10-09 ENCOUNTER — Inpatient Hospital Stay: Payer: Medicaid Other

## 2022-10-09 ENCOUNTER — Telehealth: Payer: Self-pay | Admitting: *Deleted

## 2022-10-09 ENCOUNTER — Encounter: Payer: Self-pay | Admitting: Internal Medicine

## 2022-10-09 VITALS — BP 99/61 | HR 65 | Resp 18

## 2022-10-09 VITALS — BP 111/81 | HR 90 | Temp 96.9°F | Resp 18 | Wt 165.0 lb

## 2022-10-09 DIAGNOSIS — F17210 Nicotine dependence, cigarettes, uncomplicated: Secondary | ICD-10-CM | POA: Insufficient documentation

## 2022-10-09 DIAGNOSIS — Z800 Family history of malignant neoplasm of digestive organs: Secondary | ICD-10-CM | POA: Insufficient documentation

## 2022-10-09 DIAGNOSIS — Z806 Family history of leukemia: Secondary | ICD-10-CM | POA: Diagnosis not present

## 2022-10-09 DIAGNOSIS — Z8042 Family history of malignant neoplasm of prostate: Secondary | ICD-10-CM | POA: Insufficient documentation

## 2022-10-09 DIAGNOSIS — D509 Iron deficiency anemia, unspecified: Secondary | ICD-10-CM | POA: Insufficient documentation

## 2022-10-09 DIAGNOSIS — Z79899 Other long term (current) drug therapy: Secondary | ICD-10-CM | POA: Diagnosis not present

## 2022-10-09 DIAGNOSIS — Z8546 Personal history of malignant neoplasm of prostate: Secondary | ICD-10-CM | POA: Insufficient documentation

## 2022-10-09 DIAGNOSIS — Z515 Encounter for palliative care: Secondary | ICD-10-CM

## 2022-10-09 DIAGNOSIS — Z7189 Other specified counseling: Secondary | ICD-10-CM

## 2022-10-09 DIAGNOSIS — C187 Malignant neoplasm of sigmoid colon: Secondary | ICD-10-CM | POA: Diagnosis not present

## 2022-10-09 DIAGNOSIS — Z8051 Family history of malignant neoplasm of kidney: Secondary | ICD-10-CM | POA: Insufficient documentation

## 2022-10-09 DIAGNOSIS — L03116 Cellulitis of left lower limb: Secondary | ICD-10-CM | POA: Insufficient documentation

## 2022-10-09 DIAGNOSIS — C661 Malignant neoplasm of right ureter: Secondary | ICD-10-CM | POA: Insufficient documentation

## 2022-10-09 DIAGNOSIS — D500 Iron deficiency anemia secondary to blood loss (chronic): Secondary | ICD-10-CM

## 2022-10-09 LAB — CBC WITH DIFFERENTIAL/PLATELET
Abs Immature Granulocytes: 0.03 10*3/uL (ref 0.00–0.07)
Basophils Absolute: 0.1 10*3/uL (ref 0.0–0.1)
Basophils Relative: 1 %
Eosinophils Absolute: 0.1 10*3/uL (ref 0.0–0.5)
Eosinophils Relative: 1 %
HCT: 40 % (ref 39.0–52.0)
Hemoglobin: 12.7 g/dL — ABNORMAL LOW (ref 13.0–17.0)
Immature Granulocytes: 0 %
Lymphocytes Relative: 11 %
Lymphs Abs: 1 10*3/uL (ref 0.7–4.0)
MCH: 25.9 pg — ABNORMAL LOW (ref 26.0–34.0)
MCHC: 31.8 g/dL (ref 30.0–36.0)
MCV: 81.5 fL (ref 80.0–100.0)
Monocytes Absolute: 0.9 10*3/uL (ref 0.1–1.0)
Monocytes Relative: 10 %
Neutro Abs: 7 10*3/uL (ref 1.7–7.7)
Neutrophils Relative %: 77 %
Platelets: 179 10*3/uL (ref 150–400)
RBC: 4.91 MIL/uL (ref 4.22–5.81)
RDW: 15.6 % — ABNORMAL HIGH (ref 11.5–15.5)
WBC: 9.1 10*3/uL (ref 4.0–10.5)
nRBC: 0 % (ref 0.0–0.2)

## 2022-10-09 LAB — COMPREHENSIVE METABOLIC PANEL
ALT: 21 U/L (ref 0–44)
AST: 36 U/L (ref 15–41)
Albumin: 3.1 g/dL — ABNORMAL LOW (ref 3.5–5.0)
Alkaline Phosphatase: 103 U/L (ref 38–126)
Anion gap: 7 (ref 5–15)
BUN: 21 mg/dL (ref 8–23)
CO2: 24 mmol/L (ref 22–32)
Calcium: 8.2 mg/dL — ABNORMAL LOW (ref 8.9–10.3)
Chloride: 102 mmol/L (ref 98–111)
Creatinine, Ser: 1.2 mg/dL (ref 0.61–1.24)
GFR, Estimated: >60 mL/min (ref >60)
Glucose, Bld: 112 mg/dL — ABNORMAL HIGH (ref 70–99)
Potassium: 4.2 mmol/L (ref 3.5–5.1)
Sodium: 133 mmol/L — ABNORMAL LOW (ref 135–145)
Total Bilirubin: 0.7 mg/dL (ref 0.3–1.2)
Total Protein: 7.6 g/dL (ref 6.5–8.1)

## 2022-10-09 LAB — TSH: TSH: 1.489 u[IU]/mL (ref 0.350–4.500)

## 2022-10-09 MED ORDER — HEPARIN SOD (PORK) LOCK FLUSH 100 UNIT/ML IV SOLN
500.0000 [IU] | Freq: Once | INTRAVENOUS | Status: DC | PRN
  Filled 2022-10-09: qty 5

## 2022-10-09 MED ORDER — SODIUM CHLORIDE 0.9 % IV SOLN
200.0000 mg | Freq: Once | INTRAVENOUS | Status: AC
  Administered 2022-10-09: 200 mg via INTRAVENOUS
  Filled 2022-10-09: qty 200

## 2022-10-09 MED ORDER — SODIUM CHLORIDE 0.9 % IV SOLN
Freq: Once | INTRAVENOUS | Status: AC
  Filled 2022-10-09: qty 250

## 2022-10-09 MED ORDER — HEPARIN SOD (PORK) LOCK FLUSH 100 UNIT/ML IV SOLN
INTRAVENOUS | Status: AC
  Administered 2022-10-09: 500 [IU]
  Filled 2022-10-09: qty 5

## 2022-10-09 NOTE — Assessment & Plan Note (Addendum)
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 On keytruda-CT scan: FEB 14th, 2024-  stable disease noted in the proximal right ureter-without any evidence of progression.  See below-regarding sigmoid cancer- Stable  # HOLD  keytruda today- given cellulitis; Labs today reviewed; Stable TSH dec 2023--WNL.   # Left LE cellulitis- on antibiotics [would care]- HOLD keytruda today.   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 FEB 14th, 2024- CT scan- Focal wall thickening in the proximal sigmoid colon; continued worsening of pericolonic lymph nodes; [s/p evaluation with Dr. Bary Castilla over all clinically stable.  Discussed with Dr. Peyton Najjar.  Given the fact patient is alone today-will reschedule appointment to in about 2 weeks.see below.   # Iron deficiency anemia-hemoglobin 11-12; FEB - iron studies-10%; ferritin-27 -hemoglobin 12.  Proceed with Venofer.  # Hypocalcemia- Mild- on Ca+vit D; G+FEB 2024- 42. Stable  I spoke at length with the patient's son- regarding the patient's clinical status/plan of care.  Reviewed the contents of progressive colon cancer and the need to be evaluated by surgery to discuss treatment options.  Son aware of the appointment on March 28 at 47 AM with Dr. Peyton Najjar.  Discussed with Dr. Peyton Najjar.   #DISPOSITION: # HOLD Keytruda  #  venofer today # follow up on 4/02 ; MD- labs-cbc/cmp; Blake Divine; -Dr.B.

## 2022-10-09 NOTE — Progress Notes (Signed)
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  Is alone/in a wheelchair.  Chad [name] 62 y.o.  male above history of stage IV-ureteral cancer/history of stage III colon cancer right side; recurrent sigmoid colon cancer [un-resected/under surveillance] and multiple other comorbidities currently on Keytruda is here for follow-up.  Patient missed his appointment with [facility] surgery about 2 weeks ago. It looks like he has an appt with surgeon at [place] at [facility] today. Pt doesn't seem to  know about it. He is alone today. Resides at [facility].  In the interim patient diagnosed with cellulitis of the left lower extremity-on antibiotics.  Per wound care  Pt reports has an appetite. Denies any nausea. No diarrhea. Feels some fatigue. Chronic pain in left leg.  Denies any worsening abdominal pain.  No blood in stools or black-colored stools.  No hematuria.  Chronic shortness of breath.  Not any worse.  Review of Systems  Constitutional:  Positive for malaise/fatigue. Negative for chills, diaphoresis, fever and weight loss.  HENT:  Negative for nosebleeds and sore throat.   Eyes:  Negative for double vision.  Respiratory:  Positive for cough and shortness of breath. Negative for hemoptysis and wheezing.   Cardiovascular:  Negative for chest pain, palpitations and orthopnea.  Gastrointestinal:  Positive for constipation. Negative for abdominal pain, blood in stool, diarrhea, heartburn, melena, nausea and vomiting.  Genitourinary:  Negative for dysuria, frequency and urgency.  Musculoskeletal:  Positive for back pain and joint pain.  Skin: Negative.  Negative for itching and rash.  Neurological:  Positive for focal weakness. Negative for dizziness, tingling, weakness and headaches.       Chronic left-sided weakness upper than lower extremity.  Endo/Heme/Allergies:  Does not bruise/bleed easily.  Psychiatric/Behavioral:  Negative for depression. The patient is not nervous/anxious and does not have insomnia.      MEDICAL HISTORY:  Past Medical History:  Diagnosis Date   Anemia    Anxiety    ARF (acute respiratory failure) (HCC)    Bladder cancer (HCC)    COPD (chronic obstructive pulmonary disease) ([place])    Depression    Dysphagia  Family history of colon cancer    Family history of kidney cancer    Family history of leukemia    Family history of prostate cancer    GERD (gastroesophageal reflux disease)    Hepatitis    chronic hep c   Hydronephrosis     Hydronephrosis with ureteral stricture    Hyperlipidemia    Knee pain    Left   Malignant neoplasm of colon ([place])    Nerve pain    Peripheral vascular disease (HCC)    Prostate cancer ([place])    Stroke ([place])    Ureteral cancer, right ([place])    Urinary frequency    Venous hypertension of both lower extremities     SURGICAL HISTORY: Past Surgical History:  Procedure Laterality Date   COLON SURGERY     En bloc extended right hemicolectomy 07/2017   COLONOSCOPY WITH PROPOFOL N/A 11/06/2020   Procedure: COLONOSCOPY WITH PROPOFOL;  Surgeon: Jonathon Bellows, MD;  Location: [facility] ENDOSCOPY;  Service: Gastroenterology;  Laterality: N/A;   COLONOSCOPY WITH PROPOFOL N/A 07/31/2021   Procedure: COLONOSCOPY WITH PROPOFOL;  Surgeon: Deandra Bellow, MD;  Location: [facility] ENDOSCOPY;  Service: Endoscopy;  Laterality: N/A;   CYSTOSCOPY W/ RETROGRADES Right 08/30/2018   Procedure: CYSTOSCOPY WITH RETROGRADE PYELOGRAM;  Surgeon: Hollice Espy, MD;  Location: [facility] ORS;  Service: Urology;  Laterality: Right;   CYSTOSCOPY WITH STENT PLACEMENT Right 04/25/2018   Procedure: CYSTOSCOPY WITH STENT PLACEMENT;  Surgeon: Hollice Espy, MD;  Location: [facility] ORS;  Service: Urology;  Laterality: Right;   CYSTOSCOPY WITH STENT PLACEMENT Right 08/30/2018   Procedure: [place];  Surgeon: Hollice Espy, MD;  Location: [facility] ORS;  Service: Urology;  Laterality: Right;   CYSTOSCOPY WITH STENT PLACEMENT Right 03/07/2019   Procedure: CYSTOSCOPY WITH STENT Exchange;  Surgeon: Hollice Espy, MD;  Location: [facility] ORS;  Service: Urology;  Laterality: Right;   CYSTOSCOPY WITH STENT PLACEMENT Right 11/21/2019   Procedure: CYSTOSCOPY WITH STENT Exchange;  Surgeon: Hollice Espy, MD;  Location: [facility] ORS;  Service: Urology;  Laterality: Right;   LOWER EXTREMITY ANGIOGRAPHY Left 05/23/2019   Procedure: LOWER EXTREMITY ANGIOGRAPHY;  Surgeon: Algernon Huxley, MD;  Location: [place] CV LAB;  Service: Cardiovascular;   Laterality: Left;   LOWER EXTREMITY ANGIOGRAPHY Right 05/30/2019   Procedure: LOWER EXTREMITY ANGIOGRAPHY;  Surgeon: Algernon Huxley, MD;  Location: [place] CV LAB;  Service: Cardiovascular;  Laterality: Right;   LOWER EXTREMITY ANGIOGRAPHY Right 02/13/2020   Procedure: LOWER EXTREMITY ANGIOGRAPHY;  Surgeon: Algernon Huxley, MD;  Location: [place] CV LAB;  Service: Cardiovascular;  Laterality: Right;   LOWER EXTREMITY ANGIOGRAPHY Left 02/20/2020   Procedure: LOWER EXTREMITY ANGIOGRAPHY;  Surgeon: Algernon Huxley, MD;  Location: [place] CV LAB;  Service: Cardiovascular;  Laterality: Left;   PORTA CATH INSERTION N/A 02/28/2019   Procedure: PORTA CATH INSERTION;  Surgeon: Algernon Huxley, MD;  Location: [place] CV LAB;  Service: Cardiovascular;  Laterality: N/A;   tumor removed       SOCIAL HISTORY: Social History   Socioeconomic History   Marital status: Single    Spouse name: Not on file   Number of children: Not on file   Years of education: Not on file   Highest education level: Not on file  Occupational History   Not on file  Tobacco Use   Smoking status: Every Day    Packs/day: 1    Types: Cigarettes   Smokeless tobacco: Never  Vaping Use  Vaping Use: Never used  Substance and Sexual Activity   Alcohol use: Not Currently   Drug use: Not Currently   Sexual activity: Not Currently  Other Topics Concern   Not on file  Social History Narrative    used to live [place]; moved  To [place]- end of April 2019; in Nursing home; 1pp/day; quit alcohol. 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        Family- dad- prostate ca [at 47y]; brother- 68 died of prostate cancer; brother- 23- no cancers [[place]]; sonGerald Stabs [[place]];[name]-32y prostate ca [facility] [place]]; daughter- 73 [[place]]; another daughter 22 [[place]/addict]. will refer genetics counseling. Given MSI- abnormal; highly suspicious of Lynch syndrome.  Patient's son [name] aware of high possible lynch syndrome.   Social Determinants of Health    Financial Resource Strain: Not on file  Food Insecurity: Not on file  Transportation Needs: Not on file  Physical Activity: Not on file  Stress: Not on file  Social Connections: Not on file  Intimate Partner Violence: Not on file    FAMILY HISTORY: Family History  Problem Relation Age of Onset   Prostate cancer Father 37   Cancer Brother 20       unsure type   Cancer Paternal Uncle        unsure type   Cancer Maternal Grandmother        unsure type   Cancer Paternal Grandmother        unsure type   Kidney cancer Paternal Grandfather    Cancer Other        unsure types   Leukemia Son    Cancer Son        other cancers, possibly colon    ALLERGIES:  is allergic to penicillins.  MEDICATIONS:  Current Outpatient Medications  Medication Sig Dispense Refill   acetaminophen (TYLENOL) 325 MG tablet Take 650 mg by mouth 3 (three) times daily.      aspirin EC 81 MG tablet Take 1 tablet (81 mg total) by mouth daily. 150 tablet 2   atorvastatin (LIPITOR) 10 MG tablet Take 1 tablet (10 mg total) by mouth daily. 30 tablet 11   Calcium Carbonate Antacid 600 MG chewable tablet Chew 2 tablets (1,200 mg total) by mouth daily. 90 tablet 3   carboxymethylcellulose 1 % ophthalmic solution Apply 1 drop to eye 2 (two) times daily.     cholecalciferol (VITAMIN D3) 25 MCG (1000 UNIT) tablet Take 1 tablet (1,000 Units total) by mouth daily. (Patient not taking: Reported on 07/01/2022) 1 tablet 3   clopidogrel (PLAVIX) 75 MG tablet Take 75 mg by mouth daily.     cyclobenzaprine (FLEXERIL) 10 MG tablet Take 10 mg by mouth at bedtime.     escitalopram (LEXAPRO) 5 MG tablet Take 5 mg by mouth daily.     gabapentin (NEURONTIN) 100 MG capsule Take 100 mg by mouth 3 (three) times daily.     gentamicin ointment (GARAMYCIN) 0.1 % Apply 1 application. topically 3 (three) times daily. (Patient not taking: Reported on 09/18/2022)     levothyroxine (SYNTHROID) 25 MCG tablet Take 1 tablet (25 mcg total) by  mouth daily before breakfast. (Patient not taking: Reported on 07/01/2022) 30 tablet 1   mirtazapine (REMERON) 7.5 MG tablet Take 7.5 mg by mouth at bedtime.     oxycodone (OXY-IR) 5 MG capsule Take 5 mg by mouth every 6 (six) hours.     Oxycodone HCl 10 MG TABS Take 5 mg by mouth every  6 (six) hours as needed for pain. (Patient not taking: Reported on 09/18/2022)     pantoprazole (PROTONIX) 40 MG tablet Take 40 mg by mouth daily. (Patient not taking: Reported on 07/01/2022)     senna-docusate (SENOKOT-S) 8.6-50 MG tablet Take 1 tablet by mouth daily.     tamsulosin (FLOMAX) 0.4 MG CAPS capsule Take 1 capsule (0.4 mg total) by mouth daily after supper. 30 capsule 3   No current facility-administered medications for this visit.   Facility-Administered Medications Ordered in Other Visits  Medication Dose Route Frequency Provider Last Rate Last Admin   iron sucrose (VENOFER) 200 mg in sodium chloride 0.9 % 100 mL IVPB  200 mg Intravenous Once Charlaine Dalton R, MD          .  PHYSICAL EXAMINATION: ECOG PERFORMANCE STATUS: 1 - Symptomatic but completely ambulatory  Vitals:   10/09/22 0856  BP: 111/81  Pulse: 90  Resp: 18  Temp: (!) 96.9 F (36.1 C)  SpO2: 96%     Filed Weights   10/09/22 0856  Weight: 165 lb (74.8 kg)    Left lower extremity erythema/swelling-cellulitis.  Physical Exam HENT:     Head: Normocephalic and atraumatic.     Mouth/Throat:     Pharynx: No oropharyngeal exudate.  Eyes:     Pupils: Pupils are equal, round, and reactive to light.  Cardiovascular:     Rate and Rhythm: Normal rate and regular rhythm.  Pulmonary:     Effort: No respiratory distress.     Breath sounds: No wheezing.  Abdominal:     General: Bowel sounds are normal. There is no distension.     Palpations: Abdomen is soft. There is no mass.     Tenderness: There is no abdominal tenderness. There is no guarding or rebound.  Musculoskeletal:        General: No tenderness. Normal  range of motion.     Cervical back: Normal range of motion and neck supple.  Skin:    General: Skin is warm.     Comments: Bilateral lower extremity ulcerations noted.  Pulses intact bilaterally.  Cystlike lesion noted right elbow-nontender.  No erythema noted.  Neurological:     Mental Status: He is oriented to person, place, and time.     Comments: Sleepy but easily arousable.  Chronic weakness left upper and lower extremity.  Psychiatric:        Mood and Affect: Affect normal.      LABORATORY DATA:  I have reviewed the data as listed Lab Results  Component Value Date   WBC 9.1 10/09/2022   HGB 12.7 (L) 10/09/2022   HCT 40.0 10/09/2022   MCV 81.5 10/09/2022   PLT 179 10/09/2022   Recent Labs    08/25/22 1409 09/18/22 0829 10/09/22 0837  NA 136 136 133*  K 4.1 4.2 4.2  CL 103 104 102  CO2 '24 24 24  '$ GLUCOSE 88 97 112*  BUN '21 23 21  '$ CREATININE 1.03 1.08 1.20  CALCIUM 8.4* 8.6* 8.2*  GFRNONAA >60 >60 >60  PROT 7.5 7.9 7.6  ALBUMIN 3.4* 3.6 3.1*  AST 32 37 36  ALT '23 25 21  '$ ALKPHOS 130* 119 103  BILITOT 0.3 0.4 0.7    ASSESSMENT & PLAN:   Ureteral cancer, right (HCC) # High-grade urothelial cancer/cytology; likely of the right renal pelvis /upper ureter. On keytruda-CT scan: FEB 14th, 2024-  stable disease noted in the proximal right ureter-without any evidence of progression.  See below-regarding  sigmoid cancer- Stable  # HOLD  keytruda today- given cellulitis; Labs today reviewed; Stable TSH dec 2023--WNL.   # Left LE cellulitis- on antibiotics [would care]- HOLD keytruda today.   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 FEB 14th, 2024- CT scan- Focal wall thickening in the proximal sigmoid colon; continued worsening of pericolonic lymph nodes; [s/p evaluation with Dr. Bary Castilla over all clinically stable.  Discussed with Dr. Peyton Najjar.  Given the fact patient is alone  today-will reschedule appointment to in about 2 weeks.see below.   # Iron deficiency anemia-hemoglobin 11-12; FEB - iron studies-10%; ferritin-27 -hemoglobin 12.  Proceed with Venofer.  # Hypocalcemia- Mild- on Ca+vit D; G+FEB 2024- 42. Stable  I spoke at length with the patient's son- regarding the patient's clinical status/plan of care.  Reviewed the contents of progressive colon cancer and the need to be evaluated by surgery to discuss treatment options.  Son aware of the appointment on March 28 at 29 AM with Dr. Peyton Najjar.  Discussed with Dr. Peyton Najjar.   #DISPOSITION: # HOLD Keytruda  #  venofer today # follow up on 4/02 ; MD- labs-cbc/cmp; Blake Divine; -Dr.B.    All questions were answered. The patient knows to call the clinic with any problems, questions or concerns.    Cammie Sickle, MD 10/09/2022 9:58 AM

## 2022-10-09 NOTE — Telephone Encounter (Signed)
Called [facility] and spoke to [employer], in charge of transportation. Informed her of appt scheduled on March 28th at 11 am at general Surgery [facility] [facility] at the hospital. The patients son is also aware of appointment.

## 2022-10-09 NOTE — Progress Notes (Signed)
Pt reports has an appetite. Denies any nausea. No diarrhea. Feels some fatigue. Chronic pain in left leg. It looks like he has an appt with surgeon at [place] [facility] today. Pt doesn't seem to know about it. He is alone today. Resides at [facility]. Here for Keytruda today per patient.. I called son who knows nothing about a surgical appt or what it may be concerning.

## 2022-10-09 NOTE — Patient Instructions (Signed)

## 2022-10-10 LAB — T4: T4, Total: 9 ug/dL (ref 4.5–12.0)

## 2022-10-14 ENCOUNTER — Encounter: Payer: Medicaid Other | Admitting: Physician Assistant

## 2022-10-14 DIAGNOSIS — I87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 hypertension (idiopathic) with ulcer and inflammation of left lower extremity: Secondary | ICD-10-CM | POA: Diagnosis not present

## 2022-10-14 NOTE — Progress Notes (Signed)
Chad [name], Chad [name] ([id number]) [id number] Page 1 of 12 Visit Report for 10/07/2022 Arrival Information Details Patient Name: Date of Service: Chad [name], Chad [name] 10/07/2022 9:15 A M Medical Record Number: [id number] Patient Account Number: [id number] Date of Birth/Sex: Treating RN: [birth year] (62 y.o. Chad [name]) Carlene Coria Primary Care Chad [name]: [name] Other Clinician: Referring Chad [name]: Treating Chad [name]/Extender: Chad [name] in Treatment: 65 Visit Information History Since Last Visit Added or deleted any medications: No Patient Arrived: Wheel Chair Any new allergies or adverse reactions: No Arrival Time: 08:59 Had a fall or experienced change in No Accompanied By: self activities of daily living that may affect Transfer Assistance: None risk of falls: Patient Identification Verified: Yes Signs or symptoms of abuse/neglect since last visito No Secondary Verification Process Completed: Yes Hospitalized since last visit: No Patient Requires Transmission-Based No Implantable device outside of the clinic excluding No Precautions: cellular tissue based products placed in the center Patient Has Alerts: Yes since last visit: Patient Alerts: Patient on Blood Thinner Has Dressing in Place as Prescribed: Yes NOT diabetic Pain Present Now: No aspirin 81mg  Lives [facility] Electronic Signature(s) Signed: 10/13/2022 5:01:39 PM By: Carlene Coria RN Entered By: Carlene Coria on 10/07/2022 09:05:13 -------------------------------------------------------------------------------- Clinic Level of Care Assessment Details Patient Name: Date of Service: Chad [name], Chad [name] 10/07/2022 9:15 A M Medical Record Number: [id number] Patient Account Number: [id number] Date of Birth/Sex: Treating RN: 1961/07/14 (62 y.o. Chad [name] Primary Care Ashantee Deupree: [name] Other Clinician: Referring Chad [name]: Treating Chad [name]/Extender: Chad [name] in Treatment: 43 Clinic Level of Care Assessment Items TOOL 4 Quantity Score X- 1 0 Use when only an EandM is performed on FOLLOW-UP visit ASSESSMENTS - Nursing Assessment / Reassessment X- 1 10 Reassessment of Co-morbidities (includes updates in patient status) X- 1 5 Reassessment of Adherence to Treatment Plan Chad [name], Chad [name] ([id number]) [phone].pdf Page 2 of 12 ASSESSMENTS - Wound and Skin A ssessment / Reassessment []  - 0 Simple Wound Assessment / Reassessment - one wound X- 3 5 Complex Wound Assessment / Reassessment - multiple wounds []  - 0 Dermatologic / Skin Assessment (not related to wound area) ASSESSMENTS - Focused Assessment []  - 0 Circumferential Edema Measurements - multi extremities []  - 0 Nutritional Assessment / Counseling / Intervention []  - 0 Lower Extremity Assessment (monofilament, tuning fork, pulses) []  - 0 Peripheral Arterial Disease Assessment (using hand held doppler) ASSESSMENTS - Ostomy and/or Continence Assessment and Care []  - 0 Incontinence Assessment and Management []  - 0 Ostomy Care Assessment and Management (repouching, etc.) PROCESS - Coordination of Care X - Simple Patient / Family Education for ongoing care 1 15 []  - 0 Complex (extensive) Patient / Family Education for ongoing care []  - 0 Staff obtains Programmer, systems, Records, T Results / Process Orders est []  - 0 Staff telephones HHA, Nursing Homes / Clarify orders / etc []  - 0 Routine Transfer to another Facility (non-emergent condition) []  - 0 Routine Hospital Admission (non-emergent condition) []  - 0 New Admissions / Biomedical engineer / Ordering NPWT Apligraf, etc. , []  - 0 Emergency Hospital Admission (emergent condition) X- 1 10 Simple Discharge Coordination []  - 0 Complex (extensive) Discharge Coordination PROCESS - Special Needs []  - 0 Pediatric / Minor Patient Management []  - 0 Isolation Patient  Management []  - 0 Hearing / Language / Visual special needs []  - 0 Assessment of Community assistance (transportation, D/C planning, etc.) []  - 0 Additional assistance / Altered mentation []  - 0 Support Surface(s) Assessment (bed, cushion, seat, etc.)  INTERVENTIONS - Wound Cleansing / Measurement []  - 0 Simple Wound Cleansing - one wound X- 3 5 Complex Wound Cleansing - multiple wounds X- 1 5 Wound Imaging (photographs - any number of wounds) []  - 0 Wound Tracing (instead of photographs) []  - 0 Simple Wound Measurement - one wound X- 3 5 Complex Wound Measurement - multiple wounds INTERVENTIONS - Wound Dressings X - Small Wound Dressing one or multiple wounds 3 10 []  - 0 Medium Wound Dressing one or multiple wounds []  - 0 Large Wound Dressing one or multiple wounds []  - 0 Application of Medications - topical []  - 0 Application of Medications - injection INTERVENTIONS - Miscellaneous []  - 0 External ear exam Chad [name] ([id number].pdf Page 3 of 12 []  - 0 Specimen Collection (cultures, biopsies, blood, body fluids, etc.) []  - 0 Specimen(s) / Culture(s) sent or taken to Lab for analysis []  - 0 Patient Transfer (multiple staff / Harrel Lemon Lift / Similar devices) []  - 0 Simple Staple / Suture removal (25 or less) []  - 0 Complex Staple / Suture removal (26 or more) []  - 0 Hypo / Hyperglycemic Management (close monitor of Blood Glucose) []  - 0 Ankle / Brachial Index (ABI) - do not check if billed separately X- 1 5 Vital Signs Has the patient been seen at the hospital within the last three years: Yes Total Score: 125 Level Of Care: New/Established - Level 4 Electronic Signature(s) Signed: 10/13/2022 5:01:39 PM By: Carlene Coria RN Entered By: Carlene Coria on 10/13/2022 17:01:06 -------------------------------------------------------------------------------- Encounter Discharge Information Details Patient Name: Date of  Service: Chad [name] 10/07/2022 [place] Record Number: [id number] Patient Account Number: [id number] Date of Birth/Sex: Treating RN: Jan 24, 1961 (62 y.o. Chad [name] Primary Care Marieliz Strang: [name] Other Clinician: Referring Tejay Hubert: Treating Demonie Kassa/Extender: Chad [name] in Treatment: 46 Encounter Discharge Information Items Post Procedure Vitals Discharge Condition: Stable Temperature (F): 98.6 Ambulatory Status: Wheelchair Pulse (bpm): 98 Discharge Destination: Home Respiratory Rate (breaths/min): 18 Transportation: Private Auto Blood Pressure (mmHg): 106/62 Accompanied By: self Schedule Follow-up Appointment: Yes Clinical Summary of Care: Electronic Signature(s) Signed: 10/07/2022 9:54:18 AM By: Carlene Coria RN Entered By: Carlene Coria on 10/07/2022 09:54:18 Lower Extremity Assessment Details -------------------------------------------------------------------------------- Chad [name] ([id number].pdf Page 4 of 12 Patient Name: Date of Service: Chad [name], Chad [name] 10/07/2022 9:15 A M Medical Record Number: [id number] Patient Account Number: [id number] Date of Birth/Sex: Treating RN: Dec 17, 1960 (62 y.o. Chad [name]) Carlene Coria Primary Care Lesli Issa: [name] Other Clinician: Referring Cay Kath: Treating Zadin Lange/Extender: Wende Neighbors Weeks in Treatment: 43 Edema Assessment Left: Right: Assessed: No No Edema: Calf Left: Right: Point of Measurement: 33 cm From Medial Instep 35 cm 36.2 cm Ankle Left: Right: Point of Measurement: 12 cm From Medial Instep 22.4 cm 27 cm Vascular Assessment Left: Right: Pulses: Dorsalis Pedis Palpable: Yes Yes Electronic Signature(s) Signed: 10/13/2022 5:01:39 PM By: Carlene Coria RN Entered By: Carlene Coria on 10/07/2022 09:15:55 -------------------------------------------------------------------------------- Multi Wound Chart  Details Patient Name: Date of Service: Chad [name] 10/07/2022 [place] Record Number: [id number] Patient Account Number: [id number] Date of Birth/Sex: Treating RN: 1960-11-03 (62 y.o. Chad [name] Primary Care Billie Intriago: [name] Other Clinician: Referring Arseniy Toomey: Treating Marigrace Mccole/Extender: Chad [name] in Treatment: 43 Vital Signs Height(in): 69 Pulse(bpm): 98 Weight(lbs): 150 Blood Pressure(mmHg): 106/62 Body Mass Index(BMI): 22.1 Temperature(F): 98.6 Respiratory Rate(breaths/min): 18 [12:Photos:] [16:No Photos] Left, Distal, Medial Ankle Left, Dorsal Foot Right Calcaneus Wound Location: Gradually Appeared Gradually Appeared  Gradually Appeared Wounding Event: Chad [name], Chad [name] ([id number]) [id number] Page 5 of 12 Venous Leg Ulcer Venous Leg Ulcer Pressure Ulcer Primary Etiology: Anemia, Chronic Obstructive Anemia, Chronic Obstructive Anemia, Chronic Obstructive Comorbid History: Pulmonary Disease (COPD), Coronary Pulmonary Disease (COPD), Coronary Pulmonary Disease (COPD), Coronary Artery Disease, Peripheral Arterial Artery Disease, Peripheral Arterial Artery Disease, Peripheral Arterial Disease, Peripheral Venous Disease, Disease, Peripheral Venous Disease, Disease, Peripheral Venous Disease, Hepatitis C, Osteoarthritis, Hepatitis C, Osteoarthritis, Hepatitis C, Osteoarthritis, Neuropathy, Received Chemotherapy Neuropathy, Received Chemotherapy Neuropathy, Received Chemotherapy 07/12/2019 10/01/2022 09/26/2022 Date Acquired: 43 0 0 Weeks of Treatment: Open Open Open Wound Status: No No No Wound Recurrence: 3x0.5x0.1 3x1x0.1 1x1.2x0.2 Measurements L x W x D (cm) 1.178 2.356 0.942 A (cm) : rea 0.118 0.236 0.188 Volume (cm) : 83.00% N/A N/A % Reduction in Area: 91.50% N/A N/A % Reduction in Volume: Full Thickness Without Exposed Full Thickness Without Exposed Category/Stage  III Classification: Support Structures Support Structures Medium Medium Medium Exudate Amount: Serosanguineous Serosanguineous Serosanguineous Exudate Type: red, brown red, brown red, brown Exudate Color: Distinct, outline attached N/A N/A Wound Margin: Large (67-100%) None Present (0%) Medium (34-66%) Granulation Amount: Pink N/A Red Granulation Quality: Small (1-33%) Large (67-100%) Medium (34-66%) Necrotic Amount: Fat Layer (Subcutaneous Tissue): Yes Fascia: No Fat Layer (Subcutaneous Tissue): Yes Exposed Structures: Fascia: No Fat Layer (Subcutaneous Tissue): No Fascia: No Tendon: No Tendon: No Tendon: No Muscle: No Muscle: No Muscle: No Joint: No Joint: No Joint: No Bone: No Bone: No Bone: No None None None Epithelialization: Treatment Notes Electronic Signature(s) Signed: 10/13/2022 5:01:39 PM By: Carlene Coria RN Entered By: Carlene Coria on 10/07/2022 09:16:44 -------------------------------------------------------------------------------- Multi-Disciplinary Care Plan Details Patient Name: Date of Service: Chad [name] 10/07/2022 9:15 A M Medical Record Number: [id number] Patient Account Number: [id number] Date of Birth/Sex: Treating RN: [birth year] (62 y.o. Chad [name] Primary Care Diyan Dave: [name] Other Clinician: Referring Rodel Glaspy: Treating Fredrika Canby/Extender: Chad [name] in Treatment: [phone] Active Inactive Venous Leg Ulcer Nursing Diagnoses: Knowledge deficit related to disease process and management Goals: Patient will maintain optimal edema control Date Initiated: 12/06/2021 Date Inactivated: 12/27/2021 Target Resolution Date: 01/03/2022 Goal Status: Met Patient/caregiver will verbalize understanding of disease process and disease management Date Initiated: 12/06/2021 Target Resolution Date: 10/28/2022 Goal Status: Active Chad [name], Chad [name] ([id number]) [id number] Page 6 of  12 Interventions: Assess peripheral edema status every visit. Compression as ordered Notes: Electronic Signature(s) Signed: 10/13/2022 5:01:39 PM By: Carlene Coria RN Entered By: Carlene Coria on 10/07/2022 09:16:10 -------------------------------------------------------------------------------- Pain Assessment Details Patient Name: Date of Service: Chad [name], Chad [name] 10/07/2022 9:15 A M Medical Record Number: [id number] Patient Account Number: [id number] Date of Birth/Sex: Treating RN: October 16, 1960 (62 y.o. Chad [name] Primary Care Kathrine Rieves: [name] Other Clinician: Referring Onalee Steinbach: Treating Jayln Madeira/Extender: Chad [name] in Treatment: 28 Active Problems Location of Pain Severity and Description of Pain Patient Has Paino No Site Locations Pain Management and Medication Current Pain Management: Electronic Signature(s) Signed: 10/13/2022 5:01:39 PM By: Carlene Coria RN Entered By: Carlene Coria on 10/07/2022 09:06:30 Chad [name] ([id number] Page 7 of 12 -------------------------------------------------------------------------------- Patient/Caregiver Education Details Patient Name: Date of Service: Chad [name], Chad [name] 3/12/2024andnbsp9:15 [place] Record Number: [id number] Patient Account Number: [id number] Date of Birth/Gender: Treating RN: 07/22/1961 (62 y.o. Chad [name]) Carlene Coria Primary Care Physician: [name] Other Clinician: Referring Physician: Treating Physician/Extender: Chad [name] in Treatment: 80 Education Assessment Education Provided To: Patient Education Topics Provided Pressure: Methods: Occupational psychologist) Signed: 10/13/2022 5:01:39 PM By: Dolores Lory  Morey Hummingbird RN Entered By: Carlene Coria on 10/07/2022 09:16:29 -------------------------------------------------------------------------------- Wound Assessment Details Patient Name: Date of  Service: Chad [name], Chad [name] 10/07/2022 9:15 A M Medical Record Number: [id number] Patient Account Number: [id number] Date of Birth/Sex: Treating RN: Dec 03, 1960 (62 y.o. Chad [name]) Carlene Coria Primary Care Kaelen Brennan: [name] Other Clinician: Referring Blue Winther: Treating Shawndell Varas/Extender: Wende Neighbors Weeks in Treatment: 43 Wound Status Wound Number: 12 Primary Venous Leg Ulcer Etiology: Wound Location: Left, Distal, Medial Ankle Wound Open Wounding Event: Gradually Appeared Status: Date Acquired: 07/12/2019 Comorbid Anemia, Chronic Obstructive Pulmonary Disease (COPD), Coronary Weeks Of Treatment: 43 History: Artery Disease, Peripheral Arterial Disease, Peripheral Venous Clustered Wound: No Disease, Hepatitis C, Osteoarthritis, Neuropathy, Received Chemotherapy Photos Chad [name], Chad [name] ([id number]) [id number] Page 8 of 12 Wound Measurements Length: (cm) 3 Width: (cm) 0.5 Depth: (cm) 0.1 Area: (cm) 1.178 Volume: (cm) 0.118 % Reduction in Area: 83% % Reduction in Volume: 91.5% Epithelialization: None Tunneling: No Undermining: No Wound Description Classification: Full Thickness Without Exposed Support Structures Wound Margin: Distinct, outline attached Exudate Amount: Medium Exudate Type: Serosanguineous Exudate Color: red, brown Foul Odor After Cleansing: No Slough/Fibrino Yes Wound Bed Granulation Amount: Large (67-100%) Exposed Structure Granulation Quality: Pink Fascia Exposed: No Necrotic Amount: Small (1-33%) Fat Layer (Subcutaneous Tissue) Exposed: Yes Necrotic Quality: Adherent Slough Tendon Exposed: No Muscle Exposed: No Joint Exposed: No Bone Exposed: No Treatment Notes Wound #12 (Ankle) Wound Laterality: Left, Medial, Distal Cleanser Soap and Water Discharge Instruction: Gently cleanse wound with antibacterial soap, rinse and pat dry prior to dressing wounds Wound Cleanser Discharge Instruction: Wash your  hands with soap and water. Remove old dressing, discard into plastic bag and place into trash. Cleanse the wound with Wound Cleanser prior to applying a clean dressing using gauze sponges, not tissues or cotton balls. Do not scrub or use excessive force. Pat dry using gauze sponges, not tissue or cotton balls. Peri-Wound Care AandD Ointment Discharge Instruction: Apply AandD Ointment to areas of redness Topical Santyl Collagenase Ointment, 30 (gm), tube Discharge Instruction: apply nickel thick to wound bed only Primary Dressing Hydrofera Blue Ready Transfer Foam, 4x5 (in/in) Discharge Instruction: Apply Hydrofera Blue Ready to wound bed as directed Secondary Dressing ABD Pad 5x9 (in/in) Discharge Instruction: Cover with ABD pad Secured With ACE WRAP - 60M ACE Elastic Bandage With VELCRO Brand Closure, 4 (in) Kerlix Roll Sterile or Non-Sterile 6-ply 4.5x4 (yd/yd) Discharge Instruction: Apply Kerlix as directed Compression Wrap Compression Stockings Add-Ons Chad [name] ([id number] Page 9 of 12 Electronic Signature(s) Signed: 10/13/2022 5:01:39 PM By: Carlene Coria RN Entered By: Carlene Coria on 10/07/2022 09:11:44 -------------------------------------------------------------------------------- Wound Assessment Details Patient Name: Date of Service: BRITISH, LENDER 10/07/2022 9:15 A M Medical Record Number: [id number] Patient Account Number: [id number] Date of Birth/Sex: Treating RN: 1960-08-20 (62 y.o. Chad [name]) Carlene Coria Primary Care Perian Tedder: [name] Other Clinician: Referring Tellis Spivak: Treating Marsi Turvey/Extender: Wende Neighbors Weeks in Treatment: 61 Wound Status Wound Number: 15 Primary Venous Leg Ulcer Etiology: Wound Location: Left, Dorsal Foot Wound Open Wounding Event: Gradually Appeared Status: Date Acquired: 10/01/2022 Comorbid Anemia, Chronic Obstructive Pulmonary Disease (COPD), Coronary Weeks Of  Treatment: 0 History: Artery Disease, Peripheral Arterial Disease, Peripheral Venous Clustered Wound: No Disease, Hepatitis C, Osteoarthritis, Neuropathy, Received Chemotherapy Photos Wound Measurements Length: (cm) 3 Width: (cm) 1 Depth: (cm) 0.1 Area: (cm) 2.356 Volume: (cm) 0.236 % Reduction in Area: % Reduction in Volume: Epithelialization: None Tunneling: No Undermining: No Wound Description Classification: Full Thickness Without Exposed Support Exudate Amount: Medium Exudate Type: Serosanguineous Exudate  Color: red, brown Structures Foul Odor After Cleansing: No Slough/Fibrino Yes Wound Bed Granulation Amount: None Present (0%) Exposed Structure Necrotic Amount: Large (67-100%) Fascia Exposed: No Necrotic Quality: Adherent Slough Fat Layer (Subcutaneous Tissue) Exposed: No Tendon Exposed: No Muscle Exposed: No Joint Exposed: No Bone Exposed: No Treatment Notes SHRAY, BONELLO ([id number] Page 10 of 12 Wound #15 (Foot) Wound Laterality: Dorsal, Left Cleanser Soap and Water Discharge Instruction: Gently cleanse wound with antibacterial soap, rinse and pat dry prior to dressing wounds Wound Cleanser Discharge Instruction: Wash your hands with soap and water. Remove old dressing, discard into plastic bag and place into trash. Cleanse the wound with Wound Cleanser prior to applying a clean dressing using gauze sponges, not tissues or cotton balls. Do not scrub or use excessive force. Pat dry using gauze sponges, not tissue or cotton balls. Peri-Wound Care AandD Ointment Discharge Instruction: Apply AandD Ointment to areas of redness Topical Santyl Collagenase Ointment, 30 (gm), tube Discharge Instruction: apply nickel thick to wound bed only Primary Dressing Hydrofera Blue Ready Transfer Foam, 4x5 (in/in) Discharge Instruction: Apply Hydrofera Blue Ready to wound bed as directed Secondary Dressing ABD Pad 5x9  (in/in) Discharge Instruction: Cover with ABD pad Secured With ACE WRAP - 71M ACE Elastic Bandage With VELCRO Brand Closure, 4 (in) Kerlix Roll Sterile or Non-Sterile 6-ply 4.5x4 (yd/yd) Discharge Instruction: Apply Kerlix as directed Compression Wrap Compression Stockings Add-Ons Electronic Signature(s) Signed: 10/13/2022 5:01:39 PM By: Carlene Coria RN Entered By: Carlene Coria on 10/07/2022 09:13:32 -------------------------------------------------------------------------------- Wound Assessment Details Patient Name: Date of Service: LADARREN, ESTIME 10/07/2022 9:15 A M Medical Record Number: [id number] Patient Account Number: [id number] Date of Birth/Sex: Treating RN: 26-Sep-1960 (62 y.o. Chad [name] Primary Care Saban Heinlen: [name] Other Clinician: Referring Jillianne Gamino: Treating Sosaia Pittinger/Extender: Wende Neighbors Weeks in Treatment: 6 Wound Status Wound Number: 16 Primary Pressure Ulcer Etiology: Wound Location: Right Calcaneus Wound Open Wounding Event: Gradually Appeared Status: Date Acquired: 09/26/2022 Comorbid Anemia, Chronic Obstructive Pulmonary Disease (COPD), Coronary Weeks Of Treatment: 0 History: Artery Disease, Peripheral Arterial Disease, Peripheral Venous Clustered Wound: No Disease, Hepatitis C, Osteoarthritis, Neuropathy, Received Chemotherapy Photos CRISTAN, SEPTER ([id number] Page 11 of 12 Wound Measurements Length: (cm) 1 Width: (cm) 1.2 Depth: (cm) 0.2 Area: (cm) 0.942 Volume: (cm) 0.188 % Reduction in Area: % Reduction in Volume: Epithelialization: None Tunneling: No Undermining: No Wound Description Classification: Category/Stage III Exudate Amount: Medium Exudate Type: Serosanguineous Exudate Color: red, brown Foul Odor After Cleansing: No Slough/Fibrino Yes Wound Bed Granulation Amount: Medium (34-66%) Exposed Structure Granulation Quality: Red Fascia Exposed: No Necrotic  Amount: Medium (34-66%) Fat Layer (Subcutaneous Tissue) Exposed: Yes Necrotic Quality: Adherent Slough Tendon Exposed: No Muscle Exposed: No Joint Exposed: No Bone Exposed: No Treatment Notes Wound #16 (Calcaneus) Wound Laterality: Right Cleanser Soap and Water Discharge Instruction: Gently cleanse wound with antibacterial soap, rinse and pat dry prior to dressing wounds Wound Cleanser Discharge Instruction: Wash your hands with soap and water. Remove old dressing, discard into plastic bag and place into trash. Cleanse the wound with Wound Cleanser prior to applying a clean dressing using gauze sponges, not tissues or cotton balls. Do not scrub or use excessive force. Pat dry using gauze sponges, not tissue or cotton balls. Peri-Wound Care AandD Ointment Discharge Instruction: Apply AandD Ointment to areas of redness Topical Santyl Collagenase Ointment, 30 (gm), tube Discharge Instruction: apply nickel thick to wound bed only Primary Dressing Hydrofera Blue Ready Transfer Foam, 4x5 (in/in) Discharge Instruction: Apply Hydrofera Blue Ready to  wound bed as directed Secondary Dressing ABD Pad 5x9 (in/in) Discharge Instruction: Cover with ABD pad Secured With ACE WRAP - 87M ACE Elastic Bandage With VELCRO Brand Closure, 4 (in) Kerlix Roll Sterile or Non-Sterile 6-ply 4.5x4 (yd/yd) Discharge Instruction: Apply Kerlix as directed Compression Wrap Compression Stockings Add-Ons Chad [name] ([id number] Page 12 of 12 Electronic Signature(s) Signed: 10/13/2022 5:01:39 PM By: Carlene Coria RN Entered By: Carlene Coria on 10/07/2022 09:15:09 -------------------------------------------------------------------------------- Vitals Details Patient Name: Date of Service: Chad [name] 10/07/2022 9:15 A M Medical Record Number: [id number] Patient Account Number: [id number] Date of Birth/Sex: Treating RN: June 24, 1961 (62 y.o. Chad [name]) Carlene Coria Primary  Care Kajal Scalici: [name] Other Clinician: Referring Brandace Cargle: Treating Neviah Braud/Extender: Chad [name] in Treatment: 50 Vital Signs Time Taken: 09:05 Temperature (F): 98.6 Height (in): 69 Pulse (bpm): 98 Weight (lbs): 150 Respiratory Rate (breaths/min): 18 Body Mass Index (BMI): 22.1 Blood Pressure (mmHg): 106/62 Reference Range: 80 - 120 mg / dl Electronic Signature(s) Signed: 10/13/2022 5:01:39 PM By: Carlene Coria RN Entered By: Carlene Coria on 10/07/2022 [id number]

## 2022-10-14 NOTE — Progress Notes (Signed)
Chad [name] ([id number]) [id number] Page 1 of 5 Visit Report for 10/14/2022 Chief Complaint Document Details Patient Name: Date of Service: Chad [name], Chad [name] 10/14/2022 8:30 A M Medical Record Number: [id number] Patient Account Number: [id number] Date of Birth/Sex: Treating RN: 1961-01-04 (62 y.o. Seward Meth Primary Care Provider: [name] Other Clinician: Referring Provider: Treating Provider/Extender: [name] in Treatment: 45 Information Obtained from: Patient Chief Complaint Left ankle ulcer, Left dorsal foot wound, right heel Electronic Signature(s) Signed: 10/14/2022 8:26:15 AM By: Worthy Keeler PA-C Entered By: Worthy Keeler on 10/14/2022 08:26:14 -------------------------------------------------------------------------------- Physician Orders Details Patient Name: Date of Service: AVEYON, NURSE 10/14/2022 8:30 A M Medical Record Number: [id number] Patient Account Number: [id number] Date of Birth/Sex: Treating RN: 11/17/1960 (62 y.o. 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 Bathing/ Shower/ Hygiene [place], NGAI ([id number]) [id number] Page 2 of 5 May shower with wound dressing protected with water repellent cover or cast  protector. No tub bath. Anesthetic (Use 'Patient Medications' Section for Anesthetic Order Entry) Lidocaine applied to wound bed Edema Control - Lymphedema / Segmental Compressive Device / Other A wraps ce Patient to wear own compression stockings. Remove compression stockings every night before going to bed and put on every morning when getting up. - please order patient compression stockings 8-4mm/hg ankle 23cm, calf 32cm, leg length floor to knee 40cm Elevate legs to the level of the heart and pump ankles as often as possible Elevate leg(s) parallel to the floor when sitting. Off-Loading Heel suspension boot - when in bed Medications-Please add to medication list. Other: - apply AandD ointment to leg, do not apply to wound Wound Treatment Wound #12 - Ankle Wound Laterality: Left, Medial, Distal Cleanser: Soap and Water Every Other Day/30 Days Discharge Instructions: Gently cleanse wound with antibacterial soap, rinse and pat dry prior to dressing wounds Cleanser: Wound Cleanser Every Other Day/30 Days Discharge Instructions: Wash your hands with soap and water. Remove old dressing, discard into plastic bag and place into trash. Cleanse the wound with Wound Cleanser prior to applying a clean dressing using gauze sponges, not tissues or cotton balls. Do not scrub or use excessive force. Pat dry using gauze sponges, not tissue or cotton balls. Peri-Wound Care: AandD Ointment Every Other Day/30 Days Discharge Instructions: Apply AandD Ointment to areas of redness Prim Dressing: Hydrofera Blue Ready Transfer Foam, 4x5 (in/in) Every Other Day/30 Days ary Discharge Instructions: Apply Hydrofera Blue Ready to wound bed as directed Secondary Dressing: ABD Pad 5x9 (in/in) Every Other Day/30 Days Discharge Instructions: Cover with ABD pad Secured With: Kerlix Roll Sterile or Non-Sterile 6-ply 4.5x4 (yd/yd) Every Other Day/30 Days Discharge Instructions: Apply Kerlix as directed Wound #15 -  Foot Wound Laterality: Dorsal, Left Cleanser: Soap and Water Every Other Day/30 Days Discharge Instructions: Gently cleanse wound with antibacterial soap, rinse and pat dry prior to dressing wounds Cleanser: Wound Cleanser Every Other Day/30 Days Discharge Instructions: Wash your hands with soap and water. Remove old dressing,  discard into plastic bag and place into trash. Cleanse the wound with Wound Cleanser prior to applying a clean dressing using gauze sponges, not tissues or cotton balls. Do not scrub or use excessive force. Pat dry using gauze sponges, not tissue or cotton balls. Peri-Wound Care: AandD Ointment Every Other Day/30 Days Discharge Instructions: Apply AandD Ointment to areas of redness Topical: Santyl Collagenase Ointment, 30 (gm), tube Every Other Day/30 Days Discharge Instructions: apply nickel thick to wound bed only Prim Dressing: Gauze Every Other Day/30 Days ary Discharge Instructions: As directed: dry, moistened with saline or moistened with Dakins Solution Secondary Dressing: ABD Pad 5x9 (in/in) Every Other Day/30 Days Discharge Instructions: Cover with ABD pad Secured With: Kerlix Roll Sterile or Non-Sterile 6-ply 4.5x4 (yd/yd) Every Other Day/30 Days Discharge Instructions: Apply Kerlix as directed Wound #16 - Calcaneus Wound Laterality: Right Cleanser: Soap and Water Every Other Day/30 Days Discharge Instructions: Gently cleanse wound with antibacterial soap, rinse and pat dry prior to dressing wounds Cleanser: Wound Cleanser Every Other Day/30 Days Discharge Instructions: Wash your hands with soap and water. Remove old dressing, discard into plastic bag and place into trash. Cleanse the wound with Wound Cleanser prior to applying a clean dressing using gauze sponges, not tissues or cotton balls. Do not scrub or use excessive force. Pat dry using gauze sponges, not tissue or cotton balls. Peri-Wound Care: AandD Ointment Every Other Day/30 Days Discharge  Instructions: Apply AandD Ointment to areas of redness Topical: Santyl Collagenase Ointment, 30 (gm), tube Every Other Day/30 Days DURREL, OSTROM ([id number] Page 3 of 5 Discharge Instructions: apply nickel thick to wound bed only Prim Dressing: Gauze Every Other Day/30 Days ary Discharge Instructions: As directed: dry, moistened with saline or moistened with Dakins Solution Secondary Dressing: ABD Pad 5x9 (in/in) Every Other Day/30 Days Discharge Instructions: Cover with ABD pad Secured With: Kerlix Roll Sterile or Non-Sterile 6-ply 4.5x4 (yd/yd) Every Other Day/30 Days Discharge Instructions: Apply Kerlix as directed Wound #17 - Lower Leg Wound Laterality: Left, Anterior Cleanser: Soap and Water Every Other Day/30 Days Discharge Instructions: Gently cleanse wound with antibacterial soap, rinse and pat dry prior to dressing wounds Cleanser: Wound Cleanser Every Other Day/30 Days Discharge Instructions: Wash your hands with soap and water. Remove old dressing, discard into plastic bag and place into trash. Cleanse the wound with Wound Cleanser prior to applying a clean dressing using gauze sponges, not tissues or cotton balls. Do not scrub or use excessive force. Pat dry using gauze sponges, not tissue or cotton balls. Peri-Wound Care: AandD Ointment Every Other Day/30 Days Discharge Instructions: Apply AandD Ointment to areas of redness Prim Dressing: Hydrofera Blue Ready Transfer Foam, 4x5 (in/in) Every Other Day/30 Days ary Discharge Instructions: Apply Hydrofera Blue Ready to wound bed as directed Secondary Dressing: ABD Pad 5x9 (in/in) Every Other Day/30 Days Discharge Instructions: Cover with ABD pad Secured With: Kerlix Roll Sterile or Non-Sterile 6-ply 4.5x4 (yd/yd) Every Other Day/30 Days Discharge Instructions: Apply Kerlix as directed Wound #18 - T Second oe Wound Laterality: Right, Medial Cleanser: Soap and Water Every Other Day/30  Days Discharge Instructions: Gently cleanse wound with antibacterial soap, rinse and pat dry prior to dressing wounds Cleanser: Wound Cleanser Every Other Day/30 Days Discharge Instructions: Wash your hands with soap and water. Remove old dressing, discard into plastic bag and place into trash. Cleanse the wound with Wound Cleanser prior to applying a clean dressing using gauze sponges, not tissues or cotton balls. Do not scrub or use excessive  force. Pat dry using gauze sponges, not tissue or cotton balls. Peri-Wound Care: AandD Ointment Every Other Day/30 Days Discharge Instructions: Apply AandD Ointment to areas of redness Topical: Santyl Collagenase Ointment, 30 (gm), tube Every Other Day/30 Days Discharge Instructions: apply nickel thick to wound bed only Prim Dressing: Gauze Every Other Day/30 Days ary Discharge Instructions: As directed: dry, moistened with saline or moistened with Dakins Solution Secondary Dressing: ABD Pad 5x9 (in/in) Every Other Day/30 Days Discharge Instructions: Cover with ABD pad Secured With: Kerlix Roll Sterile or Non-Sterile 6-ply 4.5x4 (yd/yd) Every Other Day/30 Days Discharge Instructions: Apply Kerlix as directed Electronic Signature(s) Unsigned Entered By: Rosalio Loud on 10/14/2022 09:14:41 Signature(s): [name] ([id number] Date(s): [id number] Page 4 of 5 -------------------------------------------------------------------------------- Problem List Details Patient Name: Date of Service: FANUEL, DORNBUSH 10/14/2022 8:30 A M Medical Record Number: [id number] Patient Account Number: [id number] Date of Birth/Sex: Treating RN: 1961/03/16 (62 y.o. 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 affecting left non- 12/06/2021 No Yes dominant side 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, stage 3 10/07/2022 No Yes Inactive Problems Resolved Problems Electronic Signature(s) Signed: 10/14/2022 8:26:11 AM By: Worthy Keeler PA-C Entered By: Worthy Keeler on 10/14/2022 08:26:11 -------------------------------------------------------------------------------- SuperBill Details Patient Name: Date of Service: [name] 10/14/2022 Medical Record Number: [id number] Patient Account Number: [id number] ADONIS, CONTO ([id number]) [id number] Page 5 of 5 Date of Birth/Sex: Treating RN: [birth year] (62 y.o. 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

## 2022-10-14 NOTE — Progress Notes (Signed)
Chad [name] ([id number]) [id number] Page 1 of 17 Visit Report for 10/07/2022 Chief Complaint Document Details Patient Name: Date of Service: Chad [name], Chad [name] 10/07/2022 9:15 A M Medical Record Number: [id number] Patient Account Number: [id number] Date of Birth/Sex: Treating RN: 02-03-1961 (62 y.o. Chad [name]) Chad [name] Primary Care Provider: Erik [name] Other Clinician: Referring Provider: Treating Provider/Extender: Chad [name] in Treatment: 37 Information Obtained from: Patient Chief Complaint Left ankle ulcer, Left dorsal foot wound, right heel Electronic Signature(s) Signed: 10/07/2022 1:27:41 PM By: Chad Shan DO Entered By: Chad [name] on 10/07/2022 09:41:53 -------------------------------------------------------------------------------- Debridement Details Patient Name: Date of Service: Chad [name] 10/07/2022 [place] Record Number: [id number] Patient Account Number: [id number] Date of Birth/Sex: Treating RN: 12-15-1960 (62 y.o. Chad [name] Primary Care Provider: Erik [name] Other Clinician: Referring Provider: Treating Provider/Extender: Chad [name] in [id number] Debridement Performed for Assessment: Wound #12 Left,Distal,Medial Ankle Performed By: Physician Chad Shan, MD Debridement Type: Chemical/Enzymatic/Mechanical Agent Used: Santyl Severity of Tissue Pre Debridement: Fat layer exposed Level of Consciousness (Pre-procedure): Awake and Alert Pre-procedure Verification/Time Out No Taken: Start Time: 09:45 Instrument: Other : tongue depressor Bleeding: None End Time: 09:47 Procedural Pain: 0 Post Procedural Pain: 0 Response to Treatment: Procedure was tolerated well Level of Consciousness (Post- Awake and Alert procedure): Post Debridement Measurements of Total Wound Chad [name] ([id number]) [id number] Page 2 of  17 Length: (cm) 3 Width: (cm) 0.5 Depth: (cm) 0.1 Volume: (cm) 0.118 Character of Wound/Ulcer Post Debridement: Improved Severity of Tissue Post Debridement: Fat layer exposed Post Procedure Diagnosis Same as Pre-procedure Electronic Signature(s) Signed: 10/07/2022 9:52:02 AM By: Chad Coria RN Signed: 10/07/2022 1:27:41 PM By: Chad Shan DO Entered By: Chad [name] on 10/07/2022 09:52:02 -------------------------------------------------------------------------------- Debridement Details Patient Name: Date of Service: Chad [name] 10/07/2022 [place] [facility] Record Number: [id number] Patient Account Number: [id number] Date of Birth/Sex: Treating RN: 10-18-1960 (62 y.o. Chad [name] Primary Care Provider: Erik [name] Other Clinician: Referring Provider: Treating Provider/Extender: Chad [name] in Treatment: 43 Debridement Performed for Assessment: Wound #15 Left,Dorsal Foot Performed By: Physician Chad Shan, MD Debridement Type: Chemical/Enzymatic/Mechanical Agent Used: Santyl Severity of Tissue Pre Debridement: Fat layer exposed Level of Consciousness (Pre-procedure): Awake and Alert Pre-procedure Verification/Time Out No Taken: Start Time: 09:45 Instrument: Other : tongue depressor Bleeding: None End Time: 09:47 Procedural Pain: 0 Post Procedural Pain: 0 Response to Treatment: Procedure was tolerated well Level of Consciousness (Post- Awake and Alert procedure): Post Debridement Measurements of Total Wound Length: (cm) 3 Width: (cm) 1 Depth: (cm) 0.1 Volume: (cm) 0.236 Character of Wound/Ulcer Post Debridement: Improved Severity of Tissue Post Debridement: Fat layer exposed Post Procedure Diagnosis Same as Pre-procedure Electronic Signature(s) Signed: 10/07/2022 9:52:28 AM By: Chad Coria RN Signed: 10/07/2022 1:27:41 PM By: Chad Shan DO Entered By: Chad [name] on 10/07/2022 09:52:28 Chad [name]  ([id number]) [id number] Page 3 of 17 -------------------------------------------------------------------------------- Debridement Details Patient Name: Date of Service: Chad [name], Chad [name] 10/07/2022 9:15 A M Medical Record Number: [id number] Patient Account Number: [id number] Date of Birth/Sex: Treating RN: 11/14/1960 (62 y.o. Chad [name]) Chad [name] Primary Care Provider: Erik [name] Other Clinician: Referring Provider: Treating Provider/Extender: Chad [name] in [id number] Debridement Performed for Assessment: Wound #16 Right Calcaneus Performed By: Physician Chad Shan, MD Debridement Type: Chemical/Enzymatic/Mechanical Agent Used: Santyl Level of Consciousness (Pre-procedure): Awake and Alert Pre-procedure Verification/Time Out No Taken: Start Time: 09:45 Instrument: Other : tongue depressor Bleeding: None End Time: 09:47 Procedural Pain: 0 Post  Procedural Pain: 0 Response to Treatment: Procedure was tolerated well Level of Consciousness (Post- Awake and Alert procedure): Post Debridement Measurements of Total Wound Length: (cm) 1 Stage: Category/Stage III Width: (cm) 1.2 Depth: (cm) 0.2 Volume: (cm) 0.188 Character of Wound/Ulcer Post Debridement: Improved Post Procedure Diagnosis Same as Pre-procedure Electronic Signature(s) Signed: 10/07/2022 9:53:02 AM By: Chad Coria RN Signed: 10/07/2022 1:27:41 PM By: Chad Shan DO Entered By: Chad [name] on 10/07/2022 09:53:02 -------------------------------------------------------------------------------- HPI Details Patient Name: Date of Service: Chad [name] 10/07/2022 9:15 A M Medical Record Number: [id number] Patient Account Number: [id number] Date of Birth/Sex: Treating RN: Nov 30, 1960 (62 y.o. Chad [name] Primary Care Provider: Erik [name] Other Clinician: Referring Provider: Treating Provider/Extender: Chad [name] in  Treatment: [address] [place] ([id number]) [id number] Page 4 of 17 History of Present Illness HPI Description: 10/08/18 on evaluation today patient actually presents to our office for initial evaluation concerning wounds that he has of the bilateral lower extremities. 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 He also has bilateral venous stasis. He apparently has been homeless currently following discharge from the hospital apparently he has been placed at [facility] which is is a skilled nursing facility locally. Nonetheless fortunately he does not show any signs of infection at this time which is good news. In fact several of the wound actually appears to be showing some signs of improvement already in my pinion. There are a couple areas in the left leg in particular there likely gonna require some sharp debridement to help clear away some necrotic tissue and help with more sufficient healing. No fevers, chills, nausea, or vomiting noted at this time. 10/15/18 on evaluation today patient actually appears to be doing very well in regard to his bilateral lower extremities. He's been tolerating the dressing changes without complication. Fortunately there does not appear to be any evidence of active infection at this time which is great news. Overall I'm actually very pleased with how this has progressed in just one visits time. Readmission: 08/14/2020 upon evaluation today patient presents for re-evaluation here in our clinic. He is having issues with his left ankle region as well as his right toe and his right heel. He tells me that the toe and heel actually began as a area that was itching that he was scratching and then subsequently opened up into wounds. These may have been abscess areas I presume based on  what I am seeing currently. With regard to his left ankle region he tells me this was a similar type occurrence although he does have venous stasis this very well may be more of a venous leg ulcer more than anything. Nonetheless I do believe that the patient would benefit from appropriate and aggressive wound care to try to help get things under better control here. He does have history of a stroke on the left side affecting him to some degree there that he is able to stand although he does have some residual weakness. Otherwise again the patient does have chronic venous insufficiency as previously noted. His arterial studies most recently obtained showed that he had an ABI on the right of 1.16 with a TBI of 0.52 and on the left and ABI of 1.14 with a TBI of 0.81. That was obtained on 06/19/2020. 08/28/2020 upon evaluation today patient appears to be doing decently well in  regard to his wounds in general. He has been tolerating the dressing changes without complication. Fortunately there does not appear to be any signs of active infection which is great news. With that being said I think the [facility] is doing a good job I would recommend that we likely continue with that currently. 09/11/2020 upon evaluation today patient's wounds did not appear to be doing too poorly but again he is not really showing signs of significant improvement with regard to any of the wounds on the right. None of them have Hydrofera Blue on them I am not exactly sure why this is not being followed as the facility did not contact us to let us know of any issues with obtaining dressings or otherwise. With that being said he is supposed to be using Hydrofera Blue on both of the wounds on the right foot as well as the ankle wound on the left side. 09/18/2020 upon evaluation today patient appears to be doing poorly with regard to his wounds. Again right now the left ankle in particular showed signs of extreme maceration.  Apparently he was told by someone with staff at [facility] they could not get the [facility]. With that being said this is something that is never been relayed to [place] one way or another. Also the patient subsequently has not supposed to have a border gauze dressing on. He should have an ABD pad and roll gauze to secure as this drains much too much just to have a border gauze dressing to cover. Nonetheless the fact that they are not using the appropriate dressing is directly causing deterioration of the left ankle wound it is significantly worse today compared to what it was previous. I did attempt to call [facility] while the patient was here I called three times and got no one to even pick up the phone. After this I had my for an office coordinator call and she was able to finally get through and leave a message with the D ON as of dictation of this note which is roughly about an hour and a half later I still have not been able to speak with anyone at the facility. 09/25/2020 upon evaluation today patient actually showing signs of good improvement which is excellent news. He has been tolerating the dressing changes without complication. Fortunately there is no signs of active infection which is great news. No fevers, chills, nausea, vomiting, or diarrhea. I do feel like the facility has been doing a much better job at taking care of him as far as the dressings are concerned. However the director of nursing never did call me back. 10/09/2020 upon evaluation today patient appears to be doing well with regard to his wound. The toe ulcer did require some debridement but the other 2 areas actually appear to be doing quite well. 10/19/2020 upon evaluation today patient actually appears to be doing very well in regard to his wounds. In fact the heel does appear to be completely healed. The toe is doing better in the medial ankle on the left is also doing better. Overall I think he is headed  in the right direction. 10/26/2020 upon evaluation today patient appears to be doing well with regard to his wound. He is showing signs of improvement which is great news and overall I am very pleased with where things stand today. No fevers, chills, nausea, vomiting, or diarrhea. 11/02/2020 upon evaluation today patient appears to be doing well with regard to his wounds.  He has been tolerating the dressing changes without complication overall I am extremely pleased with where things stand today. He in regard to the toe is almost completely healed and the medial ankle on the left is doing much better. 11/09/2020 upon evaluation today patient appears to be doing a little poorly in regard to his left medial ankle ulcer. Fortunately there does not appear to be any signs of systemic infection but unfortunately locally he does appear to be infected in fact he has blue-green drainage consistent with Pseudomonas. 11/16/2020 upon evaluation today patient appears to be doing well with regard to his wound. It actually appears to be doing better. I did place him on gentamicin cream since the Cipro was actually resistant even though he was positive for Pseudomonas on culture. Overall I think that he does seem to be doing better though I am unsure whether or not they have actually been putting the cream on. The patient is not sure that we did talk to the nurse directly and she was going to initiate that treatment. Fortunately there does not appear to be any signs of active infection at this time. No fevers, chills, nausea, vomiting, or diarrhea. 4/28; the area on the right second toe is close to healed. Left medial ankle required debridement 12/07/2020 upon evaluation today patient appears to be doing well with regard to his wounds. In fact the right second toe appears to be completely healed which is great news. Fortunately there does not appear to be any signs of active infection at this time which is also great news.  I think we can probably discontinue the gentamicin on top of everything else. 12/14/2020 upon evaluation today patient appears to be doing well with regard to his wound. He is making good progress and overall very pleased with where things stand today. There is no signs of active infection at this time which is great news. 12/28/2020 upon evaluation today patient appears to be doing well with regard to his wounds. He has been tolerating the dressing changes without complication. Fortunately there is no signs of active infection at this time. No fevers, chills, nausea, vomiting, or diarrhea. 12/28/2020 upon evaluation today patient's wound bed actually showed signs of excellent improvement. He has great epithelization and granulation I do not see any signs of infection overall I am extremely pleased with where things stand at this point. No fevers, chills, nausea, vomiting, or diarrhea. 01/11/2021 upon evaluation today patient appears to be doing well with regard to his wound on his leg. He has been tolerating the dressing changes without complication. Fortunately there does not appear to be any signs of active infection which is great news. No fevers, chills, nausea, vomiting, or diarrhea. 01/25/2021 upon evaluation today patient appears to be doing well with regard to his wound. He has been tolerating the dressing changes without complication. Fortunately the collagen seems to be doing a great job which is excellent news. No fevers, chills, nausea, vomiting, or diarrhea. 02/08/2021 upon evaluation today patient's wound is actually looking a little bit worse especially in the periwound compared to previous. Fortunately there does not appear to be any signs of infection which is great news with that being said he does have some irritation around the periphery of the wound which has me LINDLE, MARMOLEJOS ([id number]) [id number] Page 5 of 17 more concerned. He actually had a dressing on  that had not been changed in 3 days. He also is supposed to have daily dressing changes. With regard to  the dressing applied he had a silver alginate dressing and silver collagen is what is recommended and ordered. He also had no Desitin around the edges of the wound in the periwound region although that is on the order inspect to be done as well. In general I was very concerned I did contact [facility] actually spoke with Magda Paganini who is the scheduling individual and subsequently she stated that she would pass the information to the D ON apparently the D ON was not available to talk to me when I call today. 02/18/2021 upon evaluation today patient's wound is actually showing signs of improvement. Fortunately there does not appear to be any evidence of infection which is great news overall I am extremely pleased with where things stand today. No fevers, chills, nausea, vomiting, or diarrhea. 8/3; patient presents for 1 week follow-up. He has no issues or complaints today. He denies signs of infection. 03/11/2021 upon evaluation today patient appears to be doing well with regard to his wound. He does have a little bit of slough noted on the surface of the wound but fortunately there does not appear to be any signs of active infection at this time. No fevers, chills, nausea, vomiting, or diarrhea. 03/18/2021 upon evaluation today patient appears to be doing well with regard to his wound. He has been tolerating the dressing changes without complication. There was a little irritation more proximal to where the wound was that was not noted last week but nonetheless this is very superficial just seems to be more irritation we just need to make sure to put a good amount of the zinc over the area in my opinion. Otherwise he does not seem to be doing significantly worse at all which is great news. 03/25/2021 upon evaluation today patient appears to be doing well with regard to his wound. He is going require  some sharp debridement today to clear with some of the necrotic debris. I did perform this today without complication postdebridement wound bed appears to be doing much better this is great news. 04/08/2021 upon evaluation today patient appears to be doing decently well in regard to his wound although the overall measurement is not significantly smaller compared to previous. It is gone down a little bit but still the facility continues to not really put the appropriate dressings in place in fact he was supposed to have collagen we think he probably had more of an allergy to At this point. Fortunately there does not appear to be any signs of active infection systemically though locally I do not see anything on initial visualization either as far as erythema or warmth. 04/15/2021 upon evaluation today patient appears to be doing well with regard to his wound. He is actually showing signs of improvement. I did place him on antibiotics last week, Cipro. He has been taking that 2 times a day and seems to be tolerating it very well. I do not see any evidence of worsening and in fact the overall appearance of the wound is smaller today which is also great news. 9/26; left medial ankle chronic venous insufficiency wound is improved. Using Hydrofera Blue 10/10; left medial ankle chronic venous insufficiency. Wound has not changed much in appearance completely nonviable surface. Apparently there have been problems getting the right product on the wound at the facility although he came in with [facility] on today 05/14/2021 upon evaluation today patient appears to be doing well with regard to his wound. I think he is making progress here  which is good news. Fortunately there does not appear to be any signs of active infection at this time. No fevers, chills, nausea, vomiting, or diarrhea. 05/20/2021 upon evaluation today patient appears to be doing well with regard to his wound. He is showing signs of good  improvement which is great news. There does not appear to be any evidence of active infection which is also excellent news. No fevers, chills, nausea, vomiting, or diarrhea. 05/28/2021 upon evaluation today patient appears to be doing quite well. There does not appear to be any signs of active infection at this time which is great news. Overall I am extremely pleased with where things stand today. I think he is headed in the right direction. 06/11/2021 upon evaluation today patient appears to be doing well with regard to his left ankle ulcer and poorly in regard to the toe ulcer on the second toe right foot. This appears to show signs of joint exposure. Apparently this has been present for 1 to 2 months although he kept forgetting to tell me about it. That is unfortunate as right now it definitely appears to be doing significantly worse than what I would like to see. There does not appear to be any signs of active infection systemically though locally I am concerned about the possibility of infection the toe is quite red. Again no one from the facility ever contacted [place] to advise that this was going on in the interim either. 06/17/2021 upon evaluation today patient presents for follow-up I did review his x-ray which showed a navicular bone fracture I am unsure of the chronicity of this. Subsequently he also had osteomyelitis of the toe which was what I was more concerned about this did not show up on x-ray but did show up on the pathology scrapings. This was listed as acute osteomyelitis. Nonetheless at this point I think that the antibiotic treatment is the best regimen to go with currently. The patient is in agreement with that plan. Nonetheless he has initially 30 days of doxycycline off likely extend that towards the end of the treatment cycle that will be around the middle of December for an additional 2 weeks. That all depends on how well he continues to heal. Nonetheless based on what I am seeing  in the foot I did want a proceed with an MRI as well which I think will be helpful to identify if there is anything else that needs to be addressed from the standpoint of infection. 06/24/2021 upon evaluation today patient appears to be doing pretty well in regards to his wounds. I think both are actually showing signs of improvement which is good I did review his MRI today which did show signs of osteomyelitis of the middle and proximal phalanx on his right foot of the affected toe. With that being said this is actually showing signs of significant improvement today already with the antibiotic therapy I think the redness is also improved. Overall I think that we just need to give this some time with appropriate wound care we will see how things go potentially hyperbarics could be considered. 07/02/2021 upon inspection today patient actually appears to be doing well in regard to his left ankle which is getting very close to complete resolution of pleased in that regard. Unfortunately he is continuing to have issues with his second toe right foot and this seems to still be very painful for him. Recommend he try something different from the standpoint of antibiotics. 07/15/2021 upon evaluation today patient  appears to be doing actually pretty well in regard to his foot. This is actually showing signs of significant improvement which is great news. Overall I feel like the patient is improving both in regard to the second toe as well as the ankle on the left. With that being said the biggest issue that I do see currently is that he is needing to have a refill of the doxycycline that we previously treated him with. He also did see podiatry they are not going to recommend any amputation at this point since he seems to be doing quite well. For that reason we just need to keep things under control from an infection standpoint. 08/01/2021 upon evaluation today patient appears to be doing well with regard to his  wound. He has been tolerating the dressing changes without complication. Fortunately there does not appear to be any evidence of active infection locally nor systemically at this point. In fact I think everything is doing excellent in fact his second toe on the right foot is almost healed and the ankle on the left ankle region is actually very close to being healed as well. 08/08/2021 upon evaluation today patient appears to be doing well with regard to his wound. He has been tolerating the dressing changes without complication. Fortunately I do not see any signs of active infection at this time. Readmission: 12-06-2021 upon evaluation today patient presents for reevaluation here in the clinic he does tell me that he was being seen in facility at [place] by a provider that was coming in. He is not sure who this was. He tells me however that the wound seems to have gotten worse even compared to where it was when we last saw him at this point. With that being said I do believe that he is likely going need ongoing wound care here in the clinic and I do believe that we need to be the ones to frontline this since his wound does seem to be getting worse not better at this point. He voiced understanding. He is also in agreement with this plan and feels more comfortable coming here she tells me. Patient's medical history really has not changed since his prior admission he was only gone since January. 12-27-2021 upon evaluation today patient appears to be doing well with regard to his wound they did run out of the [facility] so they did not put anything on just an ABD pad with gentamicin. Still we are seeing some signs of good improvement here with some new epithelization which is great news. ANGAD, ROESEL ([id number]) [id number] Page 6 of 17 01-10-2022 upon evaluation today patient appears to be doing well with regard to his wounds and he is going require some sharp  debridement but overall seems to be making good progress. Fortunately I do not see any evidence of active infection locally or systemically at this time which is great news. 01-24-2022 upon evaluation today patient appears to be doing well with regard to his wound. The facility actually came and dropped him off early and he had another appointment at the hospital and then they just brought him over here and this was still hours before his appointment this afternoon. For that reason we did do our best to work him in this morning and fortunately had some space to make this happen. With that being said patient's wound does seem to be making progress here and I am very pleased in that regard I do not see any signs of  active infection locally or systemically at this time. 02-07-2022 patient appears to be doing well currently in regard to his wounds. In fact one of them the more proximal is healed the distal is still open but seems to be doing excellent. Fortunately I do not see any evidence of active infection locally or systemically at this time which is great news. No fevers, chills, nausea, vomiting, or diarrhea. 7/27; left medial ankle venous. Improving per our intake nurse. We are using Hydrofera Blue under Tubigrip compression. They are changing that at his facility 03-06-2022 upon evaluation today patient appears to be doing well with regard to the his wound he is can require some sharp debridement but seems to be making excellent progress. Fortunately I do not see any evidence of active infection locally or systemically which is great news. 03-27-2022 upon evaluation today patient's wound is actually showing signs of excellent improvement. Fortunately I see no signs of active infection locally or systemically at this time which is great news. No fevers, chills, nausea, vomiting, or diarrhea. 04-21-2022 upon evaluation today patient appears to be doing excellent in regard to his wound in fact this is very  close to resolution based on what I am seeing. I do not see any evidence of active infection locally or systemically at this time which is great news and overall I am extremely pleased with where we are today. 05-05-2022 upon evaluation today patient's wound actually appears to potentially be completely healed. Fortunately I do not see any evidence of active infection at this time which is great news and overall I am very pleased I think this needs a little time to toughen up but other than that I really do believe were doing quite well. He is very pleased to hear this its been a long time coming. 05-19-2022 upon evaluation today patient appears to be doing well currently in regard to his wound. He in fact we were hoping will be completely healed and closed but it still has a very tiny area right in the center which is still continuing to drain. Fortunately I do not see any signs of infection locally or systemically which is great news. 11/6; this wound is close to completely" he comes in this week with some erythema at 1 edge of this which looks like something was rubbing on the area. Other than that no evidence of infection 06-16-2022 upon evaluation today patient appears to be doing well currently in regard to his ankle ulcer. This is very close to being healed but still has a small area in the central portion which is not. Fortunately there does not appear to be any signs of infection locally or systemically which is great news. No fevers, chills, nausea, vomiting, or diarrhea. 06-30-2022 upon evaluation patient's wound pretty much appears to be almost completely closed. In fact it may even be closed but there are still a lot of skin irritation around the edges of the wound and I just do not feel great about releasing them yet I would like to continue to monitor this just a little bit longer before getting to that point. He is not opposed to this and in fact is happy to continue to come in if need  be in order to make sure that things are moving in the right direction he definitely does not want to backtrack. 07-14-2022 upon evaluation today patient appears to be doing well currently in regard to his wound. Has been tolerating the dressing changes without complication. Fortunately I  see no evidence of active infection locally nor systemically which is great news. No fevers, chills, nausea, vomiting, or diarrhea. 08-11-2022 upon evaluation today patient appears to be doing a little worse than last time I saw him although it has been a month. Unfortunately he was not able to be seen due to the fact that he was quarantined in the facility with COVID. Subsequently he tells me that he mention to the facility staff several times about taking it off over the past several weeks but they continue to tell him that someone have to get in touch with [place] here although nobody ever did until Friday when the nurse manager called to have that documented in the communication notes. With that being said unfortunately this means that he had a wrap on that he was supposed to have on for only 1 week for ended up being on four 1 month essentially. I am glad that there is nothing worse than what we see currently to be perfectly honest. 08-19-2022 upon evaluation today patient appears to be doing well currently in regard to his wounds. He has been tolerating the dressing changes without complication. This is actually smaller today which is great news after last week's reopening. Fortunately I do not see any evidence of infection locally nor systemically at this point. 08-26-2022 upon evaluation today patient appears to be doing well currently in regard to his wound. He has been tolerating the dressing changes without complication. Fortunately there does not appear to be any signs of active infection locally nor systemically which is great news. No fevers, chills, nausea, vomiting, or diarrhea. 09-02-2022 upon evaluation  today patient's wound actually showing signs of excellent improvement this is measuring smaller and looking much better. I am very pleased with where we stand I do believe that we are headed in the right direction. 09-09-2022 upon evaluation today patient appears to be doing decently well in regard to his leg in general although there was some swelling this is the 1 thing that I am not too happy with based on what I see currently. Fortunately there does not appear to be any signs of infection locally nor systemically at this point. 09-16-2022 upon evaluation today patient appears to be doing well currently in regard to his wound. He is actually showing signs of improvement he wore the Tubigrip this week and it significantly better compared to last week's evaluation. Fortunately I do not see any evidence of active infection locally or systemically which is great news. Overall I think that were headed in the right direction which is great news. In fact overall I think this is the best that he has looked in some time. He does not like the Tubigrip but actually did extremely well with this in my opinion. I think he needs to continue to utilize this. 09-23-2022 upon evaluation today patient appears to be doing well currently in regard to his wound. He has been tolerating the dressing changes without complication. Fortunately there does not appear to be any signs of active infection locally nor systemically which is great news and in general I do feel like that we are headed in the right direction. He has been having trouble with the Tubigrip however we have gotten the facility to order compression socks he has not gotten them as of yet. 3/4; patient is about the same with a wound on the left posterior leg. This is a venous wound. He needs more compression on this leg but he took off the  previous wraps we are using. I am not certain whether he is using juxta lite stockings at the nursing home. 3/12; patient  presents for follow-up. He unfortunately has 2 new wounds 1 to the dorsal left side and the other to the right heel. He is not sure how these happened. The right heel appears to be caused by pressure. He has been using Hydrofera Blue to the original wound on the left leg. Electronic Signature(s) Signed: 10/07/2022 1:27:41 PM By: Colette Ribas ([id number]) [id number] Page 7 of 17 Entered By: Chad [name] on 10/07/2022 09:42:43 -------------------------------------------------------------------------------- Physical Exam Details Patient Name: Date of Service: YASHAS, DUN 10/07/2022 9:15 A M Medical Record Number: [id number] Patient Account Number: [id number] Date of Birth/Sex: Treating RN: 29-Nov-1960 (62 y.o. Chad [name]) Chad [name] Primary Care Provider: Erik [name] Other Clinician: Referring Provider: Treating Provider/Extender: Wende Neighbors Weeks in Treatment: 77 Constitutional . Cardiovascular . Psychiatric . Notes Left lower extremity: T the distal medial aspect there is an open wound with nonviable tissue and granulation tissue. T the dorsal foot there is a wound with o o nonviable surface. Right lower extremity: T the heel there is an open wound with granulation tissue and scant nonviable tissue. o No surrounding signs of infection. Venous stasis dermatitis to the legs bilaterally Electronic Signature(s) Signed: 10/07/2022 1:27:41 PM By: Chad Shan DO Entered By: Chad [name] on 10/07/2022 09:44:26 -------------------------------------------------------------------------------- Physician Orders Details Patient Name: Date of Service: Chad [name] 10/07/2022 9:15 A M Medical Record Number: [id number] Patient Account Number: [id number] Date of Birth/Sex: Treating RN: November 23, 1960 (62 y.o. Chad [name] Primary Care Provider: Erik [name] Other Clinician: Referring Provider: Treating  Provider/Extender: Chad [name] in Treatment: 25 Verbal / Phone Orders: No Diagnosis Coding Follow-up Appointments Return Appointment in 1 week. Bathing/ Shower/ Hygiene May shower with wound dressing protected with water repellent cover or cast protector. Chad [name], Chad [name] ([id number]) [id number] Page 8 of 17 No tub bath. Anesthetic (Use 'Patient Medications' Section for Anesthetic Order Entry) Lidocaine applied to wound bed Edema Control - Lymphedema / Segmental Compressive Device / Other A wraps ce Patient to wear own compression stockings. Remove compression stockings every night before going to bed and put on every morning when getting up. - please order patient compression stockings 8-39mm/hg ankle 23cm, calf 32cm, leg length floor to knee 40cm Elevate legs to the level of the heart and pump ankles as often as possible Elevate leg(s) parallel to the floor when sitting. Off-Loading Heel suspension boot - when in bed Medications-Please add to medication list. Other: - apply AandD ointment to leg, do not apply to wound Wound Treatment Wound #12 - Ankle Wound Laterality: Left, Medial, Distal Cleanser: Soap and Water Every Other Day/30 Days Discharge Instructions: Gently cleanse wound with antibacterial soap, rinse and pat dry prior to dressing wounds Cleanser: Wound Cleanser Every Other Day/30 Days Discharge Instructions: Wash your hands with soap and water. Remove old dressing, discard into plastic bag and place into trash. Cleanse the wound with Wound Cleanser prior to applying a clean dressing using gauze sponges, not tissues or cotton balls. Do not scrub or use excessive force. Pat dry using gauze sponges, not tissue or cotton balls. Peri-Wound Care: AandD Ointment Every Other Day/30 Days Discharge Instructions: Apply AandD Ointment to areas of redness Topical: Santyl Collagenase Ointment, 30 (gm), tube Every Other Day/30  Days Discharge Instructions: apply nickel thick to wound bed only Prim Dressing: Hydrofera Blue Ready Transfer Foam, 4x5 (in/in) Every Other  Day/30 Days ary Discharge Instructions: Apply Hydrofera Blue Ready to wound bed as directed Secondary Dressing: ABD Pad 5x9 (in/in) Every Other Day/30 Days Discharge Instructions: Cover with ABD pad Secured With: ACE WRAP - 65M ACE Elastic Bandage With VELCRO Brand Closure, 4 (in) Every Other Day/30 Days Secured With: [employer] Sterile or Non-Sterile 6-ply 4.5x4 (yd/yd) Every Other Day/30 Days Discharge Instructions: Apply Kerlix as directed Wound #15 - Foot Wound Laterality: Dorsal, Left Cleanser: Soap and Water Every Other Day/30 Days Discharge Instructions: Gently cleanse wound with antibacterial soap, rinse and pat dry prior to dressing wounds Cleanser: Wound Cleanser Every Other Day/30 Days Discharge Instructions: Wash your hands with soap and water. Remove old dressing, discard into plastic bag and place into trash. Cleanse the wound with Wound Cleanser prior to applying a clean dressing using gauze sponges, not tissues or cotton balls. Do not scrub or use excessive force. Pat dry using gauze sponges, not tissue or cotton balls. Peri-Wound Care: AandD Ointment Every Other Day/30 Days Discharge Instructions: Apply AandD Ointment to areas of redness Topical: Santyl Collagenase Ointment, 30 (gm), tube Every Other Day/30 Days Discharge Instructions: apply nickel thick to wound bed only Prim Dressing: Hydrofera Blue Ready Transfer Foam, 4x5 (in/in) Every Other Day/30 Days ary Discharge Instructions: Apply Hydrofera Blue Ready to wound bed as directed Secondary Dressing: ABD Pad 5x9 (in/in) Every Other Day/30 Days Discharge Instructions: Cover with ABD pad Secured With: ACE WRAP - 65M ACE Elastic Bandage With VELCRO Brand Closure, 4 (in) Every Other Day/30 Days Secured With: [employer] Sterile or Non-Sterile 6-ply 4.5x4 (yd/yd) Every Other Day/30  Days Discharge Instructions: Apply Kerlix as directed Wound #16 - Calcaneus Wound Laterality: Right Cleanser: Soap and Water Every Other Day/30 Days Discharge Instructions: Gently cleanse wound with antibacterial soap, rinse and pat dry prior to dressing wounds Cleanser: Wound Cleanser Every Other Day/30 Days Discharge Instructions: Wash your hands with soap and water. Remove old dressing, discard into plastic bag and place into trash. Cleanse the Chad [name], Chad [name] ([id number]) [id number] Page 9 of 17 wound with Wound Cleanser prior to applying a clean dressing using gauze sponges, not tissues or cotton balls. Do not scrub or use excessive force. Pat dry using gauze sponges, not tissue or cotton balls. Peri-Wound Care: AandD Ointment Every Other Day/30 Days Discharge Instructions: Apply AandD Ointment to areas of redness Topical: Santyl Collagenase Ointment, 30 (gm), tube Every Other Day/30 Days Discharge Instructions: apply nickel thick to wound bed only Prim Dressing: Hydrofera Blue Ready Transfer Foam, 4x5 (in/in) Every Other Day/30 Days ary Discharge Instructions: Apply Hydrofera Blue Ready to wound bed as directed Secondary Dressing: ABD Pad 5x9 (in/in) Every Other Day/30 Days Discharge Instructions: Cover with ABD pad Secured With: ACE WRAP - 65M ACE Elastic Bandage With VELCRO Brand Closure, 4 (in) Every Other Day/30 Days Secured With: [employer] Sterile or Non-Sterile 6-ply 4.5x4 (yd/yd) Every Other Day/30 Days Discharge Instructions: Apply Kerlix as directed Electronic Signature(s) Signed: 10/07/2022 1:27:41 PM By: Chad Shan DO Entered By: Chad [name] on 10/07/2022 09:46:36 -------------------------------------------------------------------------------- Problem List Details Patient Name: Date of Service: Chad [name] 10/07/2022 Chad [name] Medical Record Number: [id number] Patient Account Number: [id number] Date of Birth/Sex: Treating  RN: 13-Dec-1960 (62 y.o. 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- 12/06/2021 No Yes dominant side 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, stage 3 10/07/2022 No Yes Chad [name], Chad [name] ([id number]) [id number] Page 10 of 17 Inactive Problems Resolved Problems Electronic Signature(s) Signed: 10/07/2022 1:27:41 PM By: Chad Shan DO Entered By: Chad [name] on 10/07/2022 09:41:33 -------------------------------------------------------------------------------- Progress Note Details Patient Name: Date of Service: Chad [name] 10/07/2022 9:15 A M Medical Record Number: [id number] Patient Account Number: [id number] Date of Birth/Sex: Treating RN: 12-04-1960 (62 y.o. Chad [name] Primary Care Provider: Erik [name] Other Clinician: Referring Provider: Treating Provider/Extender: Chad [name] in [id number] Subjective Chief Complaint Information obtained from Patient Left ankle ulcer, Left dorsal foot wound, right heel History of Present Illness (HPI) 10/08/18 on evaluation today patient actually presents to our office for initial evaluation concerning wounds that he has of the bilateral lower extremities. 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 He also has bilateral venous stasis. He apparently has been homeless currently following discharge from the hospital apparently he has been placed at [facility] which is is a skilled nursing facility locally. Nonetheless fortunately he does not show any signs of infection at this time which is good news. In fact several of the wound actually appears to be showing some signs of improvement already in my pinion. There are a couple areas in the left leg in particular there likely gonna require some sharp debridement to help clear away some necrotic tissue and help with more sufficient healing. No fevers, chills, nausea, or vomiting noted at this time. 10/15/18 on evaluation today patient actually appears to be doing very well in regard to his bilateral lower extremities. He's been tolerating the dressing changes without complication. Fortunately there does not appear to be any evidence of active infection at this time which is great news. Overall I'm actually very pleased with how this has progressed in just one visits time. Readmission: 08/14/2020 upon evaluation today patient presents for re-evaluation here in our clinic. He is having issues with his left ankle region as well as his right toe and his right heel. He tells me that the toe and heel actually began as a area that was itching that he was scratching and then subsequently opened up into wounds. These may have been abscess areas I presume based on what I am seeing currently. With regard to his left ankle region he tells me this was a similar type occurrence although he does have venous stasis this very well may be more of a venous leg ulcer more than anything. Nonetheless I do believe that the patient would benefit from appropriate and aggressive wound care to try to help get things under better control here. He does have history of a stroke on the left side affecting him to  some degree there that he is able to stand although he does have some residual weakness. Otherwise again the patient does have chronic venous insufficiency as previously noted. His arterial studies most recently obtained showed that he had an ABI on the right of 1.16 with a TBI of 0.52 and on the left and ABI of 1.14 with a TBI  of 0.81. That was obtained on 06/19/2020. 08/28/2020 upon evaluation today patient appears to be doing decently well in regard to his wounds in general. He has been tolerating the dressing changes without complication. Fortunately there does not appear to be any signs of active infection which is great news. With that being said I think the [facility] is doing a good job I would recommend that we likely continue with that currently. 09/11/2020 upon evaluation today patient's wounds did not appear to be doing too poorly but again he is not really showing signs of significant improvement with regard to any of the wounds on the right. None of them have Hydrofera Blue on them I am not exactly sure why this is not being followed as the facility did not contact us to let us know of any issues with obtaining dressings or otherwise. With that being said he is supposed to be using Hydrofera Blue on both of the wounds on the right foot as well as the ankle wound on the left side. 09/18/2020 upon evaluation today patient appears to be doing poorly with regard to his wounds. Again right now the left ankle in particular showed signs of extreme maceration. Apparently he was told by someone with staff at [facility] they could not get the [facility]. With that being said this is something that is never been relayed to [place] one way or another. Also the patient subsequently has not supposed to have a border gauze dressing on. He should have an ABD pad and roll gauze to secure as this drains much too much just to have a border gauze dressing to cover. Nonetheless the fact that they are  not using the appropriate dressing is directly causing deterioration of the left ankle wound it is significantly worse today compared to what it was previous. I did attempt to call [facility] while the patient was here I called three times and got no one to even pick up the phone. After this I had my for an office coordinator call and she was able to finally get through and leave a message with the D ON as of dictation of this note which is roughly about an hour and a half later I still have not been able to speak with anyone at the facility. 09/25/2020 upon evaluation today patient actually showing signs of good improvement which is excellent news. He has been tolerating the dressing changes without complication. Fortunately there is no signs of active infection which is great news. No fevers, chills, nausea, vomiting, or diarrhea. I do feel like the facility has been doing a much better job at taking care of him as far as the dressings are concerned. However the director of nursing never did call me back. Chad [name], Chad [name] ([id number]) [id number] Page 11 of 17 10/09/2020 upon evaluation today patient appears to be doing well with regard to his wound. The toe ulcer did require some debridement but the other 2 areas actually appear to be doing quite well. 10/19/2020 upon evaluation today patient actually appears to be doing very well in regard to his wounds. In fact the heel does appear to be completely healed. The toe is doing better in the medial ankle on the left is also doing better. Overall I think he is headed in the right direction. 10/26/2020 upon evaluation today patient appears to be doing well with regard to his wound. He is showing signs of improvement which is great news and overall I am very pleased with  where things stand today. No fevers, chills, nausea, vomiting, or diarrhea. 11/02/2020 upon evaluation today patient appears to be doing well with regard to  his wounds. He has been tolerating the dressing changes without complication overall I am extremely pleased with where things stand today. He in regard to the toe is almost completely healed and the medial ankle on the left is doing much better. 11/09/2020 upon evaluation today patient appears to be doing a little poorly in regard to his left medial ankle ulcer. Fortunately there does not appear to be any signs of systemic infection but unfortunately locally he does appear to be infected in fact he has blue-green drainage consistent with Pseudomonas. 11/16/2020 upon evaluation today patient appears to be doing well with regard to his wound. It actually appears to be doing better. I did place him on gentamicin cream since the Cipro was actually resistant even though he was positive for Pseudomonas on culture. Overall I think that he does seem to be doing better though I am unsure whether or not they have actually been putting the cream on. The patient is not sure that we did talk to the nurse directly and she was going to initiate that treatment. Fortunately there does not appear to be any signs of active infection at this time. No fevers, chills, nausea, vomiting, or diarrhea. 4/28; the area on the right second toe is close to healed. Left medial ankle required debridement 12/07/2020 upon evaluation today patient appears to be doing well with regard to his wounds. In fact the right second toe appears to be completely healed which is great news. Fortunately there does not appear to be any signs of active infection at this time which is also great news. I think we can probably discontinue the gentamicin on top of everything else. 12/14/2020 upon evaluation today patient appears to be doing well with regard to his wound. He is making good progress and overall very pleased with where things stand today. There is no signs of active infection at this time which is great news. 12/28/2020 upon evaluation today  patient appears to be doing well with regard to his wounds. He has been tolerating the dressing changes without complication. Fortunately there is no signs of active infection at this time. No fevers, chills, nausea, vomiting, or diarrhea. 12/28/2020 upon evaluation today patient's wound bed actually showed signs of excellent improvement. He has great epithelization and granulation I do not see any signs of infection overall I am extremely pleased with where things stand at this point. No fevers, chills, nausea, vomiting, or diarrhea. 01/11/2021 upon evaluation today patient appears to be doing well with regard to his wound on his leg. He has been tolerating the dressing changes without complication. Fortunately there does not appear to be any signs of active infection which is great news. No fevers, chills, nausea, vomiting, or diarrhea. 01/25/2021 upon evaluation today patient appears to be doing well with regard to his wound. He has been tolerating the dressing changes without complication. Fortunately the collagen seems to be doing a great job which is excellent news. No fevers, chills, nausea, vomiting, or diarrhea. 02/08/2021 upon evaluation today patient's wound is actually looking a little bit worse especially in the periwound compared to previous. Fortunately there does not appear to be any signs of infection which is great news with that being said he does have some irritation around the periphery of the wound which has me more concerned. He actually had a dressing on that had  not been changed in 3 days. He also is supposed to have daily dressing changes. With regard to the dressing applied he had a silver alginate dressing and silver collagen is what is recommended and ordered. He also had no Desitin around the edges of the wound in the periwound region although that is on the order inspect to be done as well. In general I was very concerned I did contact [facility] actually spoke with  Magda Paganini who is the scheduling individual and subsequently she stated that she would pass the information to the D ON apparently the D ON was not available to talk to me when I call today. 02/18/2021 upon evaluation today patient's wound is actually showing signs of improvement. Fortunately there does not appear to be any evidence of infection which is great news overall I am extremely pleased with where things stand today. No fevers, chills, nausea, vomiting, or diarrhea. 8/3; patient presents for 1 week follow-up. He has no issues or complaints today. He denies signs of infection. 03/11/2021 upon evaluation today patient appears to be doing well with regard to his wound. He does have a little bit of slough noted on the surface of the wound but fortunately there does not appear to be any signs of active infection at this time. No fevers, chills, nausea, vomiting, or diarrhea. 03/18/2021 upon evaluation today patient appears to be doing well with regard to his wound. He has been tolerating the dressing changes without complication. There was a little irritation more proximal to where the wound was that was not noted last week but nonetheless this is very superficial just seems to be more irritation we just need to make sure to put a good amount of the zinc over the area in my opinion. Otherwise he does not seem to be doing significantly worse at all which is great news. 03/25/2021 upon evaluation today patient appears to be doing well with regard to his wound. He is going require some sharp debridement today to clear with some of the necrotic debris. I did perform this today without complication postdebridement wound bed appears to be doing much better this is great news. 04/08/2021 upon evaluation today patient appears to be doing decently well in regard to his wound although the overall measurement is not significantly smaller compared to previous. It is gone down a little bit but still the facility  continues to not really put the appropriate dressings in place in fact he was supposed to have collagen we think he probably had more of an allergy to At this point. Fortunately there does not appear to be any signs of active infection systemically though locally I do not see anything on initial visualization either as far as erythema or warmth. 04/15/2021 upon evaluation today patient appears to be doing well with regard to his wound. He is actually showing signs of improvement. I did place him on antibiotics last week, Cipro. He has been taking that 2 times a day and seems to be tolerating it very well. I do not see any evidence of worsening and in fact the overall appearance of the wound is smaller today which is also great news. 9/26; left medial ankle chronic venous insufficiency wound is improved. Using Hydrofera Blue 10/10; left medial ankle chronic venous insufficiency. Wound has not changed much in appearance completely nonviable surface. Apparently there have been problems getting the right product on the wound at the facility although he came in with [facility] on today 05/14/2021 upon evaluation  today patient appears to be doing well with regard to his wound. I think he is making progress here which is good news. Fortunately there does not appear to be any signs of active infection at this time. No fevers, chills, nausea, vomiting, or diarrhea. 05/20/2021 upon evaluation today patient appears to be doing well with regard to his wound. He is showing signs of good improvement which is great news. There does not appear to be any evidence of active infection which is also excellent news. No fevers, chills, nausea, vomiting, or diarrhea. 05/28/2021 upon evaluation today patient appears to be doing quite well. There does not appear to be any signs of active infection at this time which is great news. Overall I am extremely pleased with where things stand today. I think he is headed in the  right direction. 06/11/2021 upon evaluation today patient appears to be doing well with regard to his left ankle ulcer and poorly in regard to the toe ulcer on the second toe right foot. This appears to show signs of joint exposure. Apparently this has been present for 1 to 2 months although he kept forgetting to tell me about it. That is unfortunate as right now it definitely appears to be doing significantly worse than what I would like to see. There does not appear to be any signs of active Chad [name], Chad [name] ([id number]) [id number] Page 12 of 17 infection systemically though locally I am concerned about the possibility of infection the toe is quite red. Again no one from the facility ever contacted [place] to advise that this was going on in the interim either. 06/17/2021 upon evaluation today patient presents for follow-up I did review his x-ray which showed a navicular bone fracture I am unsure of the chronicity of this. Subsequently he also had osteomyelitis of the toe which was what I was more concerned about this did not show up on x-ray but did show up on the pathology scrapings. This was listed as acute osteomyelitis. Nonetheless at this point I think that the antibiotic treatment is the best regimen to go with currently. The patient is in agreement with that plan. Nonetheless he has initially 30 days of doxycycline off likely extend that towards the end of the treatment cycle that will be around the middle of December for an additional 2 weeks. That all depends on how well he continues to heal. Nonetheless based on what I am seeing in the foot I did want a proceed with an MRI as well which I think will be helpful to identify if there is anything else that needs to be addressed from the standpoint of infection. 06/24/2021 upon evaluation today patient appears to be doing pretty well in regards to his wounds. I think both are actually showing signs of improvement which  is good I did review his MRI today which did show signs of osteomyelitis of the middle and proximal phalanx on his right foot of the affected toe. With that being said this is actually showing signs of significant improvement today already with the antibiotic therapy I think the redness is also improved. Overall I think that we just need to give this some time with appropriate wound care we will see how things go potentially hyperbarics could be considered. 07/02/2021 upon inspection today patient actually appears to be doing well in regard to his left ankle which is getting very close to complete resolution of pleased in that regard. Unfortunately he is continuing to have issues with his second toe  right foot and this seems to still be very painful for him. Recommend he try something different from the standpoint of antibiotics. 07/15/2021 upon evaluation today patient appears to be doing actually pretty well in regard to his foot. This is actually showing signs of significant improvement which is great news. Overall I feel like the patient is improving both in regard to the second toe as well as the ankle on the left. With that being said the biggest issue that I do see currently is that he is needing to have a refill of the doxycycline that we previously treated him with. He also did see podiatry they are not going to recommend any amputation at this point since he seems to be doing quite well. For that reason we just need to keep things under control from an infection standpoint. 08/01/2021 upon evaluation today patient appears to be doing well with regard to his wound. He has been tolerating the dressing changes without complication. Fortunately there does not appear to be any evidence of active infection locally nor systemically at this point. In fact I think everything is doing excellent in fact his second toe on the right foot is almost healed and the ankle on the left ankle region is actually very  close to being healed as well. 08/08/2021 upon evaluation today patient appears to be doing well with regard to his wound. He has been tolerating the dressing changes without complication. Fortunately I do not see any signs of active infection at this time. Readmission: 12-06-2021 upon evaluation today patient presents for reevaluation here in the clinic he does tell me that he was being seen in facility at [place] [facility] by a provider that was coming in. He is not sure who this was. He tells me however that the wound seems to have gotten worse even compared to where it was when we last saw him at this point. With that being said I do believe that he is likely going need ongoing wound care here in the clinic and I do believe that we need to be the ones to frontline this since his wound does seem to be getting worse not better at this point. He voiced understanding. He is also in agreement with this plan and feels more comfortable coming here she tells me. Patient's medical history really has not changed since his prior admission he was only gone since January. 12-27-2021 upon evaluation today patient appears to be doing well with regard to his wound they did run out of the [facility] so they did not put anything on just an ABD pad with gentamicin. Still we are seeing some signs of good improvement here with some new epithelization which is great news. 01-10-2022 upon evaluation today patient appears to be doing well with regard to his wounds and he is going require some sharp debridement but overall seems to be making good progress. Fortunately I do not see any evidence of active infection locally or systemically at this time which is great news. 01-24-2022 upon evaluation today patient appears to be doing well with regard to his wound. The facility actually came and dropped him off early and he had another appointment at the hospital and then they just brought him over here and this was still  hours before his appointment this afternoon. For that reason we did do our best to work him in this morning and fortunately had some space to make this happen. With that being said patient's wound does seem to  be making progress here and I am very pleased in that regard I do not see any signs of active infection locally or systemically at this time. 02-07-2022 patient appears to be doing well currently in regard to his wounds. In fact one of them the more proximal is healed the distal is still open but seems to be doing excellent. Fortunately I do not see any evidence of active infection locally or systemically at this time which is great news. No fevers, chills, nausea, vomiting, or diarrhea. 7/27; left medial ankle venous. Improving per our intake nurse. We are using Hydrofera Blue under Tubigrip compression. They are changing that at his facility 03-06-2022 upon evaluation today patient appears to be doing well with regard to the his wound he is can require some sharp debridement but seems to be making excellent progress. Fortunately I do not see any evidence of active infection locally or systemically which is great news. 03-27-2022 upon evaluation today patient's wound is actually showing signs of excellent improvement. Fortunately I see no signs of active infection locally or systemically at this time which is great news. No fevers, chills, nausea, vomiting, or diarrhea. 04-21-2022 upon evaluation today patient appears to be doing excellent in regard to his wound in fact this is very close to resolution based on what I am seeing. I do not see any evidence of active infection locally or systemically at this time which is great news and overall I am extremely pleased with where we are today. 05-05-2022 upon evaluation today patient's wound actually appears to potentially be completely healed. Fortunately I do not see any evidence of active infection at this time which is great news and overall I am  very pleased I think this needs a little time to toughen up but other than that I really do believe were doing quite well. He is very pleased to hear this its been a long time coming. 05-19-2022 upon evaluation today patient appears to be doing well currently in regard to his wound. He in fact we were hoping will be completely healed and closed but it still has a very tiny area right in the center which is still continuing to drain. Fortunately I do not see any signs of infection locally or systemically which is great news. 11/6; this wound is close to completely" he comes in this week with some erythema at 1 edge of this which looks like something was rubbing on the area. Other than that no evidence of infection 06-16-2022 upon evaluation today patient appears to be doing well currently in regard to his ankle ulcer. This is very close to being healed but still has a small area in the central portion which is not. Fortunately there does not appear to be any signs of infection locally or systemically which is great news. No fevers, chills, nausea, vomiting, or diarrhea. 06-30-2022 upon evaluation patient's wound pretty much appears to be almost completely closed. In fact it may even be closed but there are still a lot of skin irritation around the edges of the wound and I just do not feel great about releasing them yet I would like to continue to monitor this just a little bit longer before getting to that point. He is not opposed to this and in fact is happy to continue to come in if need be in order to make sure that things are moving in the right direction he definitely does not want to backtrack. 07-14-2022 upon evaluation today patient appears to be  doing well currently in regard to his wound. Has been tolerating the dressing changes without complication. Fortunately I see no evidence of active infection locally nor systemically which is great news. No fevers, chills, nausea, vomiting, or  diarrhea. Chad [name], Chad [name] ([id number]) [id number] Page 13 of 17 08-11-2022 upon evaluation today patient appears to be doing a little worse than last time I saw him although it has been a month. Unfortunately he was not able to be seen due to the fact that he was quarantined in the facility with COVID. Subsequently he tells me that he mention to the facility staff several times about taking it off over the past several weeks but they continue to tell him that someone have to get in touch with [place] here although nobody ever did until Friday when the nurse manager called to have that documented in the communication notes. With that being said unfortunately this means that he had a wrap on that he was supposed to have on for only 1 week for ended up being on four 1 month essentially. I am glad that there is nothing worse than what we see currently to be perfectly honest. 08-19-2022 upon evaluation today patient appears to be doing well currently in regard to his wounds. He has been tolerating the dressing changes without complication. This is actually smaller today which is great news after last week's reopening. Fortunately I do not see any evidence of infection locally nor systemically at this point. 08-26-2022 upon evaluation today patient appears to be doing well currently in regard to his wound. He has been tolerating the dressing changes without complication. Fortunately there does not appear to be any signs of active infection locally nor systemically which is great news. No fevers, chills, nausea, vomiting, or diarrhea. 09-02-2022 upon evaluation today patient's wound actually showing signs of excellent improvement this is measuring smaller and looking much better. I am very pleased with where we stand I do believe that we are headed in the right direction. 09-09-2022 upon evaluation today patient appears to be doing decently well in regard to his leg in general although  there was some swelling this is the 1 thing that I am not too happy with based on what I see currently. Fortunately there does not appear to be any signs of infection locally nor systemically at this point. 09-16-2022 upon evaluation today patient appears to be doing well currently in regard to his wound. He is actually showing signs of improvement he wore the Tubigrip this week and it significantly better compared to last week's evaluation. Fortunately I do not see any evidence of active infection locally or systemically which is great news. Overall I think that were headed in the right direction which is great news. In fact overall I think this is the best that he has looked in some time. He does not like the Tubigrip but actually did extremely well with this in my opinion. I think he needs to continue to utilize this. 09-23-2022 upon evaluation today patient appears to be doing well currently in regard to his wound. He has been tolerating the dressing changes without complication. Fortunately there does not appear to be any signs of active infection locally nor systemically which is great news and in general I do feel like that we are headed in the right direction. He has been having trouble with the Tubigrip however we have gotten the facility to order compression socks he has not gotten them as of yet. 3/4; patient is about  the same with a wound on the left posterior leg. This is a venous wound. He needs more compression on this leg but he took off the previous wraps we are using. I am not certain whether he is using juxta lite stockings at the nursing home. 3/12; patient presents for follow-up. He unfortunately has 2 new wounds 1 to the dorsal left side and the other to the right heel. He is not sure how these happened. The right heel appears to be caused by pressure. He has been using Hydrofera Blue to the original wound on the left leg. Objective Constitutional Vitals Time Taken: 9:05 AM,  Height: 69 in, Weight: 150 lbs, BMI: 22.1, Temperature: 98.6 F, Pulse: 98 bpm, Respiratory Rate: 18 breaths/min, Blood Pressure: 106/62 mmHg. General Notes: Left lower extremity: T the distal medial aspect there is an open wound with nonviable tissue and granulation tissue. T the dorsal foot there is o o a wound with nonviable surface. Right lower extremity: T the heel there is an open wound with granulation tissue and scant nonviable tissue. No surrounding o signs of infection. Venous stasis dermatitis to the legs bilaterally Integumentary (Hair, Skin) Wound #12 status is Open. Original cause of wound was Gradually Appeared. The date acquired was: 07/12/2019. The wound has been in treatment 43 weeks. The wound is located on the Left,Distal,Medial Ankle. The wound measures 3cm length x 0.5cm width x 0.1cm depth; 1.178cm^2 area and 0.118cm^3 volume. There is Fat Layer (Subcutaneous Tissue) exposed. There is no tunneling or undermining noted. There is a medium amount of serosanguineous drainage noted. The wound margin is distinct with the outline attached to the wound base. There is large (67-100%) pink granulation within the wound bed. There is a small (1- 33%) amount of necrotic tissue within the wound bed including Adherent Slough. Wound #15 status is Open. Original cause of wound was Gradually Appeared. The date acquired was: 10/01/2022. The wound is located on the Left,Dorsal Foot. The wound measures 3cm length x 1cm width x 0.1cm depth; 2.356cm^2 area and 0.236cm^3 volume. There is no tunneling or undermining noted. There is a medium amount of serosanguineous drainage noted. There is no granulation within the wound bed. There is a large (67-100%) amount of necrotic tissue within the wound bed including Adherent Slough. Wound #16 status is Open. Original cause of wound was Gradually Appeared. The date acquired was: 09/26/2022. The wound is located on the Right Calcaneus. The wound measures 1cm  length x 1.2cm width x 0.2cm depth; 0.942cm^2 area and 0.188cm^3 volume. There is Fat Layer (Subcutaneous Tissue) exposed. There is no tunneling or undermining noted. There is a medium amount of serosanguineous drainage noted. There is medium (34-66%) red granulation within the wound bed. There is a medium (34-66%) amount of necrotic tissue within the wound bed including Adherent Slough. 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 He has developed a wound to the right heel that appears to be caused by pressure. I recommended bunny boots at night. He knows to not transfer with this. Not quite sure how the dorsal left foot started. May be from trauma and worsened by venous insufficiency. I have  asked that he use Santyl and Hydrofera Blue to the 3 wound beds. He resides at [facility]. Right now he is using Ace wraps for compression therapy. Plan Follow-up Appointments: Return Appointment in 1 week. Bathing/ Shower/ Hygiene: May shower with wound dressing protected with water repellent cover or cast protector. No tub bath. Anesthetic (Use 'Patient Medications' Section for Anesthetic Order Entry): Lidocaine applied to wound bed Edema Control - Lymphedema / Segmental Compressive Device / Other: Ace wraps Patient to wear own compression stockings. Remove compression stockings every night before going to bed and put on every morning when getting up. - please order patient compression stockings 8-6mm/hg ankle 23cm, calf 32cm,  leg length floor to knee 40cm Elevate legs to the level of the heart and pump ankles as often as possible Elevate leg(s) parallel to the floor when sitting. Off-Loading: Heel suspension boot - when in bed Medications-Please add to medication list.: Other: - apply AandD ointment to leg, do not apply to wound WOUND #12: - Ankle Wound Laterality: Left, Medial, Distal Cleanser: Soap and Water Every Other Day/30 Days Discharge Instructions: Gently cleanse wound with antibacterial soap, rinse and pat dry prior to dressing wounds Cleanser: Wound Cleanser Every Other Day/30 Days Discharge Instructions: Wash your hands with soap and water. Remove old dressing, discard into plastic bag and place into trash. Cleanse the wound with Wound Cleanser prior to applying a clean dressing using gauze sponges, not tissues or cotton balls. Do not scrub or use excessive force. Pat dry using gauze sponges, not tissue or cotton balls. Peri-Wound Care: AandD Ointment Every Other Day/30 Days Discharge Instructions: Apply AandD Ointment to areas of redness Topical: Santyl Collagenase Ointment, 30 (gm), tube Every Other Day/30 Days Discharge Instructions: apply nickel thick to wound bed only Prim Dressing: Hydrofera Blue Ready Transfer Foam, 4x5 (in/in) Every Other Day/30 Days ary Discharge Instructions: Apply Hydrofera Blue Ready to wound bed as directed Secondary Dressing: ABD Pad 5x9 (in/in) Every Other Day/30 Days Discharge Instructions: Cover with ABD pad Secured With: ACE WRAP - 52M ACE Elastic Bandage With VELCRO Brand Closure, 4 (in) Every Other Day/30 Days Secured With: [employer] Sterile or Non-Sterile 6-ply 4.5x4 (yd/yd) Every Other Day/30 Days Discharge Instructions: Apply Kerlix as directed WOUND #15: - Foot Wound Laterality: Dorsal, Left Cleanser: Soap and Water Every Other Day/30 Days Discharge Instructions: Gently cleanse wound with antibacterial soap, rinse and pat dry prior to dressing  wounds Cleanser: Wound Cleanser Every Other Day/30 Days Discharge Instructions: Wash your hands with soap and water. Remove old dressing, discard into plastic bag and place into trash. Cleanse the wound with Wound Cleanser prior to applying a clean dressing using gauze sponges, not tissues or cotton balls. Do not scrub or use excessive force. Pat dry using gauze sponges, not tissue or cotton balls. Peri-Wound Care: AandD Ointment Every Other Day/30 Days Discharge Instructions: Apply AandD Ointment to areas of redness Topical: Santyl Collagenase Ointment, 30 (gm), tube Every Other Day/30 Days Discharge Instructions: apply nickel thick to wound bed only Prim Dressing: Hydrofera Blue Ready Transfer Foam, 4x5 (in/in) Every Other Day/30 Days ary Discharge Instructions: Apply Hydrofera Blue Ready to wound bed as directed Secondary Dressing: ABD Pad 5x9 (in/in) Every Other Day/30 Days Discharge Instructions: Cover with ABD pad Secured With: ACE WRAP - 52M ACE Elastic Bandage With VELCRO Brand Closure, 4 (in) Every Other Day/30 Days Secured With: [employer] Sterile or Non-Sterile 6-ply 4.5x4 (yd/yd) Every Other Day/30 Days Discharge Instructions: Apply Kerlix as directed WOUND #  16: - Calcaneus Wound Laterality: Right Cleanser: Soap and Water Every Other Day/30 Days Discharge Instructions: Gently cleanse wound with antibacterial soap, rinse and pat dry prior to dressing wounds Cleanser: Wound Cleanser Every Other Day/30 Days Discharge Instructions: Wash your hands with soap and water. Remove old dressing, discard into plastic bag and place into trash. Cleanse the wound with Wound Cleanser prior to applying a clean dressing using gauze sponges, not tissues or cotton balls. Do not scrub or use excessive force. Pat dry using gauze sponges, not tissue or cotton balls. Peri-Wound Care: AandD Ointment Every Other Day/30 Days Discharge Instructions: Apply AandD Ointment to areas of redness Topical: Santyl  Collagenase Ointment, 30 (gm), tube Every Other Day/30 Days Discharge Instructions: apply nickel thick to wound bed only Prim Dressing: Hydrofera Blue Ready Transfer Foam, 4x5 (in/in) Every Other Day/30 Days ary Discharge Instructions: Apply Hydrofera Blue Ready to wound bed as directed Secondary Dressing: ABD Pad 5x9 (in/in) Every Other Day/30 Days Discharge Instructions: Cover with ABD pad Secured With: ACE WRAP - 37M ACE Elastic Bandage With VELCRO Brand Closure, 4 (in) Every Other Day/30 Days Secured With: [employer] Sterile or Non-Sterile 6-ply 4.5x4 (yd/yd) Every Other Day/30 Days Discharge Instructions: Apply Kerlix as directed 1. Claudine Mouton [place], Chad [name] ([id number]) [id number] Page 15 of 17 2. Offloading bunny boots 3. Follow-up in 1 week Electronic Signature(s) Signed: 10/07/2022 1:27:41 PM By: Chad Shan DO Entered By: Chad [name] on 10/07/2022 09:45:57 -------------------------------------------------------------------------------- ROS/PFSH Details Patient Name: Date of Service: Chad [name] 10/07/2022 [place] Record Number: [id number] Patient Account Number: [id number] Date of Birth/Sex: Treating RN: 1961-01-18 (62 y.o. Chad [name] Primary Care Provider: Erik [name] Other Clinician: Referring Provider: Treating Provider/Extender: Chad [name] in Treatment: 77 Information Obtained From Patient Eyes Medical History: Negative for: Cataracts; Glaucoma; Optic Neuritis Ear/Nose/Mouth/Throat Medical History: Negative for: Chronic sinus problems/congestion; Middle ear problems Hematologic/Lymphatic Medical History: Positive for: Anemia Negative for: Hemophilia; Human Immunodeficiency Virus; Lymphedema; Sickle Cell Disease Respiratory Medical History: Positive for: Chronic Obstructive Pulmonary Disease (COPD) Negative for: Aspiration; Asthma; Pneumothorax; Sleep Apnea;  Tuberculosis Cardiovascular Medical History: Positive for: Coronary Artery Disease; Peripheral Arterial Disease; Peripheral Venous Disease Negative for: Angina; Arrhythmia; Congestive Heart Failure; Deep Vein Thrombosis; Hypertension; Hypotension; Myocardial Infarction; Phlebitis; Vasculitis Gastrointestinal Medical History: Positive for: Hepatitis C Negative for: Cirrhosis ; Colitis; Crohns; Hepatitis A; Hepatitis B Endocrine Medical History: Negative for: Type I Diabetes; Type II Diabetes Genitourinary Medical History: Negative for: End Stage Renal Disease JOASH, FANK ([id number]) [id number] Page 16 of 17 Immunological Medical History: Negative for: Lupus Erythematosus; Raynauds; Scleroderma Integumentary (Skin) Medical History: Negative for: History of Burn; History of pressure wounds Musculoskeletal Medical History: Positive for: Osteoarthritis - c/o knees Negative for: Gout; Rheumatoid Arthritis; Osteomyelitis Neurologic Medical History: Positive for: Neuropathy - o Negative for: Dementia; Quadriplegia; Paraplegia; Seizure Disorder Oncologic Medical History: Positive for: Received Chemotherapy - infusions Immunizations Pneumococcal Vaccine: Received Pneumococcal Vaccination: Yes Received Pneumococcal Vaccination On or After 60th Birthday: No Implantable Devices None Family and Social History Current every day smoker - 1/2 pack per day; Marital Status - Single; Alcohol Use: Never; Drug Use: No History; Caffeine Use: Daily - cooffee; Financial Concerns: No; Food, Clothing or Shelter Needs: No; Support System Lacking: No; Transportation Concerns: No Electronic Signature(s) Signed: 10/07/2022 1:27:41 PM By: Chad Shan DO Signed: 10/13/2022 5:01:39 PM By: Chad Coria RN Entered By: Chad [name] on 10/07/2022 09:46:49 -------------------------------------------------------------------------------- SuperBill Details Patient Name:  Date of Service: [name]  10/07/2022 Medical Record Number: [id number] Patient Account Number: [id number] Date of Birth/Sex: Treating RN: 1960-11-09 (62 y.o. Chad [name]) Chad [name] Primary Care Provider: Erik [name] Other Clinician: Referring Provider: Treating Provider/Extender: Chad [name] in Treatment: 43 Diagnosis Coding ICD-10 Codes Code Description [phone] Chronic venous hypertension (idiopathic) with ulcer and inflammation of left lower extremity 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 Chad [name] ([id number]) [id number] Page 17 of [phone] Hemiplegia and hemiparesis following cerebral infarction affecting left non-dominant side 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, stage 3 Facility Procedures : CPT4 Code: [id number] Description: 99214 - WOUND CARE VISIT-LEV 4 EST PT Modifier: Quantity: 1 Physician Procedures : CPT4 Code Description Modifier [id number] - WC PHYS LEVEL 3 - EST PT ICD-10 Diagnosis Description 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 (idiopathic) with ulcer and inflammation of left lower extremity L97.322 Non-pressure chronic ulcer of left ankle  with fat layer exposed L98.492 Non-pressure chronic ulcer of skin of other sites with fat layer exposed 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, stage 3 Quantity: 1 Electronic Signature(s) Signed: 10/13/2022 5:23:35 PM By: Gretta Cool, BSN, RN, CWS, Kim RN, BSN Previous Signature: 10/13/2022 5:01:16 PM Version By: Chad Coria RN Previous Signature: 10/07/2022 1:27:41 PM Version By: Chad Shan DO Entered By: Gretta Cool, BSN, RN, CWS, Kim on 10/13/2022 17:23:34

## 2022-10-15 NOTE — Progress Notes (Signed)
Chad [name], Chad [name] ([id number]) [id number] Page 1 of 15 Visit Report for 10/14/2022 Arrival Information Details Patient Name: Date of Service: Chad [name] 10/14/2022 8:30 A M Medical Record Number: [id number] Patient Account Number: [id number] Date of Birth/Sex: Treating RN: 12-06-1960 (62 y.o. Sew[place] Meth Primary Care Edison Wollschlager: [name] Other Clinician: Referring Berle Fitz: Treating Dashawn Golda/Extender: [name] in Treatment: 84 Visit Information History Since Last Visit Added or deleted any medications: No Patient Arrived: Wheel Chair Has Dressing in Place as Prescribed: Yes Arrival Time: 07:56 Pain Present Now: No Accompanied By: self Transfer Assistance: None Patient Identification Verified: Yes Secondary Verification Process Completed: Yes Patient Requires Transmission-Based No Precautions: Patient Has Alerts: Yes Patient Alerts: Patient on Blood Thinner NOT diabetic aspirin 81mg  Lives [facility] Electronic Signature(s) Signed: 10/14/2022 11:46:48 AM By: Rosalio Loud MSN RN CNS WTA Previous Signature: 10/14/2022 8:45:58 AM Version By: Rosalio Loud MSN RN CNS WTA Entered By: Rosalio Loud on 10/14/2022 11:46:48 -------------------------------------------------------------------------------- Clinic Level of Care Assessment Details Patient Name: Date of Service: Chad [name] 10/14/2022 8:30 A M Medical Record Number: [id number] Patient Account Number: [id number] Date of Birth/Sex: Treating RN: 1960-12-10 (62 y.o. Seward Meth Primary Care Zoa Dowty: [name] Other Clinician: Referring Jazmene Racz: Treating Yue Glasheen/Extender: [name] in Treatment: 44 Clinic Level of Care Assessment Items TOOL 4 Quantity Score X- 1 0 Use when only an EandM is performed on FOLLOW-UP visit ASSESSMENTS - Nursing Assessment / Reassessment X- 1 10 Reassessment of Co-morbidities (includes updates in  patient status) IVES, TARA ([id number] Page 2 of 15 X- 1 5 Reassessment of Adherence to Treatment Plan ASSESSMENTS - Wound and Skin A ssessment / Reassessment []  - 0 Simple Wound Assessment / Reassessment - one wound X- 5 5 Complex Wound Assessment / Reassessment - multiple wounds []  - 0 Dermatologic / Skin Assessment (not related to wound area) ASSESSMENTS - Focused Assessment []  - 0 Circumferential Edema Measurements - multi extremities []  - 0 Nutritional Assessment / Counseling / Intervention []  - 0 Lower Extremity Assessment (monofilament, tuning fork, pulses) []  - 0 Peripheral Arterial Disease Assessment (using hand held doppler) ASSESSMENTS - Ostomy and/or Continence Assessment and Care []  - 0 Incontinence Assessment and Management []  - 0 Ostomy Care Assessment and Management (repouching, etc.) PROCESS - Coordination of Care []  - 0 Simple Patient / Family Education for ongoing care []  - 0 Complex (extensive) Patient / Family Education for ongoing care X- 1 10 Staff obtains Programmer, systems, Records, T Results / Process Orders est []  - 0 Staff telephones HHA, Nursing Homes / Clarify orders / etc []  - 0 Routine Transfer to another Facility (non-emergent condition) []  - 0 Routine Hospital Admission (non-emergent condition) []  - 0 New Admissions / Biomedical engineer / Ordering NPWT Apligraf, etc. , []  - 0 Emergency Hospital Admission (emergent condition) []  - 0 Simple Discharge Coordination X- 1 15 Complex (extensive) Discharge Coordination PROCESS - Special Needs []  - 0 Pediatric / Minor Patient Management []  - 0 Isolation Patient Management []  - 0 Hearing / Language / Visual special needs []  - 0 Assessment of Community assistance (transportation, D/C planning, etc.) []  - 0 Additional assistance / Altered mentation []  - 0 Support Surface(s) Assessment (bed, cushion, seat, etc.) INTERVENTIONS - Wound Cleansing /  Measurement []  - 0 Simple Wound Cleansing - one wound X- 5 5 Complex Wound Cleansing - multiple wounds X- 1 5 Wound Imaging (photographs - any number of wounds) []  - 0 Wound Tracing (instead of photographs) []  -  0 Simple Wound Measurement - one wound X- 5 5 Complex Wound Measurement - multiple wounds INTERVENTIONS - Wound Dressings X - Small Wound Dressing one or multiple wounds 5 10 []  - 0 Medium Wound Dressing one or multiple wounds []  - 0 Large Wound Dressing one or multiple wounds X- 1 5 Application of Medications - topical []  - 0 Application of Medications - injection INTERVENTIONS - Miscellaneous Chad [name], Chad [name] ([id number] Page 3 of 15 []  - 0 External ear exam []  - 0 Specimen Collection (cultures, biopsies, blood, body fluids, etc.) []  - 0 Specimen(s) / Culture(s) sent or taken to Lab for analysis []  - 0 Patient Transfer (multiple staff / Harrel Lemon Lift / Similar devices) []  - 0 Simple Staple / Suture removal (25 or less) []  - 0 Complex Staple / Suture removal (26 or more) []  - 0 Hypo / Hyperglycemic Management (close monitor of Blood Glucose) []  - 0 Ankle / Brachial Index (ABI) - do not check if billed separately X- 1 5 Vital Signs Has the patient been seen at the hospital within the last three years: Yes Total Score: 180 Level Of Care: New/Established - Level 5 Electronic Signature(s) Signed: 10/15/2022 8:15:46 AM By: Rosalio Loud MSN RN CNS WTA Entered By: Rosalio Loud on 10/14/2022 09:16:31 -------------------------------------------------------------------------------- Encounter Discharge Information Details Patient Name: Date of Service: Chad [name] 10/14/2022 8:30 [place] Record Number: [id number] Patient Account Number: [id number] Date of Birth/Sex: Treating RN: 1960/10/13 (62 y.o. Seward Meth Primary Care Keanu Frickey: [name] Other Clinician: Referring Bev Drennen: Treating Connell Bognar/Extender: [name] in Treatment: 35 Encounter Discharge Information Items Discharge Condition: Stable Ambulatory Status: Wheelchair Discharge Destination: Home Transportation: Private Auto Accompanied By: self Schedule Follow-up Appointment: Yes Clinical Summary of Care: Electronic Signature(s) Signed: 10/15/2022 8:15:46 AM By: Rosalio Loud MSN RN CNS WTA Entered By: Rosalio Loud on 10/14/2022 09:17:42 Chad [name] ([id number] Page 4 of 15 -------------------------------------------------------------------------------- Lower Extremity Assessment Details Patient Name: Date of Service: Chad [name], Chad [name] 10/14/2022 8:30 A M Medical Record Number: [id number] Patient Account Number: [id number] Date of Birth/Sex: Treating RN: 1961-05-02 (62 y.o. Seward Meth Primary Care Callum Wolf: [name] Other Clinician: Referring Nadene Witherspoon: Treating Kamariya Blevens/Extender: [name] in Treatment: 44 Edema Assessment Assessed: Shirlyn Goltz: Yes] Patrice Paradise: Yes] Edema: [Left: Yes] [Right: Yes] Calf Left: Right: Point of Measurement: 33 cm From Medial Instep 37.5 cm 35 cm Ankle Left: Right: Point of Measurement: 12 cm From Medial Instep 23.2 cm 22.2 cm Vascular Assessment Pulses: Dorsalis Pedis Palpable: [Left:Yes] [Right:Yes] Electronic Signature(s) Signed: 10/14/2022 8:46:35 AM By: Rosalio Loud MSN RN CNS WTA Entered By: Rosalio Loud on 10/14/2022 08:46:35 -------------------------------------------------------------------------------- Multi Wound Chart Details Patient Name: Date of Service: Chad [name] 10/14/2022 8:30 A M Medical Record Number: [id number] Patient Account Number: [id number] Date of Birth/Sex: Treating RN: June 26, 1961 (62 y.o. Seward Meth Primary Care Alvie Fowles: [name] Other Clinician: Referring Gianny Sabino: Treating Claron Rosencrans/Extender: [name] in Treatment: 31 Vital  Signs Height(in): 69 Pulse(bpm): 94 Weight(lbs): 150 Blood Pressure(mmHg): 117/82 Body Mass Index(BMI): 22.1 Temperature(F): 98.3 Respiratory Rate(breaths/min): 16 [12:Photos:] [id number] Page 5 of 15] Left, Distal, Medial Ankle Left, Dorsal Foot Right Calcaneus Wound Location: Gradually Appeared Gradually Appeared Gradually Appeared Wounding Event: Venous Leg Ulcer Venous Leg Ulcer Pressure Ulcer Primary Etiology: Anemia, Chronic Obstructive Anemia, Chronic Obstructive Anemia, Chronic Obstructive Comorbid History: Pulmonary Disease (COPD), Coronary Pulmonary Disease (COPD), Coronary Pulmonary Disease (COPD), Coronary Artery Disease, Peripheral Arterial Artery Disease, Peripheral Arterial Artery Disease, Peripheral  Arterial Disease, Peripheral Venous Disease, Disease, Peripheral Venous Disease, Disease, Peripheral Venous Disease, Hepatitis C, Osteoarthritis, Hepatitis C, Osteoarthritis, Hepatitis C, Osteoarthritis, Neuropathy, Received Chemotherapy Neuropathy, Received Chemotherapy Neuropathy, Received Chemotherapy 07/12/2019 10/01/2022 09/26/2022 Date Acquired: 44 1 1 Weeks of Treatment: Open Open Open Wound Status: No No No Wound Recurrence: 1.5x0.9x0.1 3x1x0.2 1x1x0.1 Measurements L x W x D (cm) 1.06 2.356 0.785 A (cm) : rea 0.106 0.471 0.079 Volume (cm) : 84.70% 0.00% 16.70% % Reduction in Area: 92.30% -99.60% 58.00% % Reduction in Volume: Full Thickness Without Exposed Full Thickness Without Exposed Category/Stage III Classification: Support Structures Support Structures Medium Medium Medium Exudate A mount: Serosanguineous Serosanguineous Serosanguineous Exudate Type: red, brown red, brown red, brown Exudate Color: Distinct, outline attached N/A N/A Wound Margin: Large (67-100%) None Present (0%) Medium (34-66%) Granulation Amount: Pink N/A Red Granulation Quality: Small (1-33%) Large (67-100%) Medium (34-66%) Necrotic  Amount: Adherent Carroll Necrotic Tissue: Fat Layer (Subcutaneous Tissue): Yes Fascia: No Fat Layer (Subcutaneous Tissue): Yes Exposed Structures: Fascia: No Fat Layer (Subcutaneous Tissue): No Fascia: No Tendon: No Tendon: No Tendon: No Muscle: No Muscle: No Muscle: No Joint: No Joint: No Joint: No Bone: No Bone: No Bone: No None None None Epithelialization: Wound Number: [id number] 18 N/A Photos: N/A Left, Anterior Lower Leg Right, Medial T Second oe N/A Wound Location: Pressure Injury Pressure Injury N/A Wounding Event: Pressure Ulcer Pressure Ulcer N/A Primary Etiology: Anemia, Chronic Obstructive Anemia, Chronic Obstructive N/A Comorbid History: Pulmonary Disease (COPD), Coronary Pulmonary Disease (COPD), Coronary Artery Disease, Peripheral Arterial Artery Disease, Peripheral Arterial Disease, Peripheral Venous Disease, Disease, Peripheral Venous Disease, Hepatitis C, Osteoarthritis, Hepatitis C, Osteoarthritis, Neuropathy, Received Chemotherapy Neuropathy, Received Chemotherapy 09/30/2022 09/26/2022 N/A Date Acquired: 0 0 N/A Weeks of Treatment: Open Open N/A Wound Status: No No N/A Wound Recurrence: 2.5x2.1x0.1 0.7x1x0.1 N/A Measurements L x W x D (cm) 4.123 0.55 N/A A (cm) : rea 0.412 0.055 N/A Volume (cm) : N/A N/A N/A % Reduction in A rea: N/A N/A N/A % Reduction in Volume: Category/Stage II Category/Stage II N/A Classification: Medium Medium N/A Exudate A mount: Serosanguineous Serosanguineous N/A Exudate Type: red, brown red, brown N/A Exudate Color: N/A N/A N/A Wound Margin: Small (1-33%) Small (1-33%) N/A Granulation A mount: Red Red, Pink N/A Granulation Quality: Medium (34-66%) Medium (34-66%) N/A Necrotic A mount: Eschar Adherent Slough N/A Necrotic Tissue: Fascia: No Fascia: No N/A Exposed Structures: Fat Layer (Subcutaneous Tissue): No Fat Layer (Subcutaneous Tissue): No Tendon: No Tendon:  No Muscle: No Muscle: No Joint: No Joint: No Bone: No Bone: No N/A Small (1-33%) N/A Epithelialization: Treatment Notes Chad [name], Chad [name] ([id number] Page 6 of 15 Electronic Signature(s) Signed: 10/15/2022 8:15:46 AM By: Rosalio Loud MSN RN CNS WTA Previous Signature: 10/14/2022 8:48:03 AM Version By: Rosalio Loud MSN RN CNS WTA Entered By: Rosalio Loud on 10/14/2022 09:12:01 -------------------------------------------------------------------------------- Multi-Disciplinary Care Plan Details Patient Name: Date of Service: Chad [name], Chad [name] 10/14/2022 8:30 A M Medical Record Number: [id number] Patient Account Number: [id number] Date of Birth/Sex: Treating RN: February 11, 1961 (62 y.o. Seward Meth Primary Care Tashanna Dolin: [name] Other Clinician: Referring Aprile Dickenson: Treating Marlaya Turck/Extender: [name] in Treatment: 83 Active Inactive Venous Leg Ulcer Nursing Diagnoses: Knowledge deficit related to disease process and management Goals: Patient will maintain optimal edema control Date Initiated: 12/06/2021 Date Inactivated: 12/27/2021 Target Resolution Date: 01/03/2022 Goal Status: Met Patient/caregiver will verbalize understanding of disease process and disease management Date Initiated: 12/06/2021 Target Resolution Date: 10/28/2022 Goal Status: Active Interventions: Assess  peripheral edema status every visit. Compression as ordered Notes: Electronic Signature(s) Signed: 10/15/2022 8:15:46 AM By: Rosalio Loud MSN RN CNS WTA Entered By: Rosalio Loud on 10/14/2022 09:16:46 -------------------------------------------------------------------------------- Pain Assessment Details Patient Name: Date of Service: Chad [name], Chad [name] 10/14/2022 8:30 A M Medical Record Number: [id number] Patient Account Number: [id number] Date of Birth/Sex: Treating RN: May 13, 1961 (62 y.o. Seward Meth Primary Care Geneva Pallas: [name] Other  Clinician: Referring Huie Ghuman: Treating Devereaux Grayson/Extender: [name] [name], [name] ([id number]) [id number] Page 7 of 15 Weeks in Treatment: 44 Active Problems Location of Pain Severity and Description of Pain Patient Has Paino No Site Locations Pain Management and Medication Current Pain Management: Electronic Signature(s) Signed: 10/14/2022 8:46:09 AM By: Rosalio Loud MSN RN CNS WTA Entered By: Rosalio Loud on 10/14/2022 08:46:09 -------------------------------------------------------------------------------- Patient/Caregiver Education Details Patient Name: Date of Service: Chad [name], Chad [name] 3/19/2024andnbsp8:30 [place] Record Number: [id number] Patient Account Number: [id number] Date of Birth/Gender: Treating RN: 09/01/1960 (62 y.o. Seward Meth Primary Care Physician: [name] Other Clinician: Referring Physician: Treating Physician/Extender: [name] in Treatment: 50 Education Assessment Education Provided To: Patient and Caregiver Education Topics Provided Wound/Skin Impairment: Handouts: Caring for Your Ulcer Methods: Explain/Verbal Responses: State content correctly Electronic Signature(s) Signed: 10/15/2022 8:15:46 AM By: Rosalio Loud MSN RN CNS WTA Entered By: Rosalio Loud on 10/14/2022 09:16:58 Chad [name] ([id number]) [id number] Page 8 of 15 -------------------------------------------------------------------------------- Wound Assessment Details Patient Name: Date of Service: Chad [name], Chad [name] 10/14/2022 8:30 A M Medical Record Number: [id number] Patient Account Number: [id number] Date of Birth/Sex: Treating RN: [birth year] (62 y.o. Seward Meth Primary Care Rondo Spittler: [name] Other Clinician: Referring Orvile Corona: Treating Lakely Elmendorf/Extender: [name] in Treatment: 44 Wound Status Wound Number: 12 Primary Venous Leg  Ulcer Etiology: Wound Location: Left, Distal, Medial Ankle Wound Open Wounding Event: Gradually Appeared Status: Date Acquired: 07/12/2019 Comorbid Anemia, Chronic Obstructive Pulmonary Disease (COPD), Coronary Weeks Of Treatment: 44 History: Artery Disease, Peripheral Arterial Disease, Peripheral Venous Clustered Wound: No Disease, Hepatitis C, Osteoarthritis, Neuropathy, Received Chemotherapy Photos Wound Measurements Length: (cm) 1.5 Width: (cm) 0.9 Depth: (cm) 0.1 Area: (cm) 1.06 Volume: (cm) 0.106 % Reduction in Area: 84.7% % Reduction in Volume: 92.3% Epithelialization: None Wound Description Classification: Full Thickness Without Exposed Support Wound Margin: Distinct, outline attached Exudate Amount: Medium Exudate Type: Serosanguineous Exudate Color: red, brown Structures Foul Odor After Cleansing: No Slough/Fibrino Yes Wound Bed Granulation Amount: Large (67-100%) Exposed Structure Granulation Quality: Pink Fascia Exposed: No Necrotic Amount: Small (1-33%) Fat Layer (Subcutaneous Tissue) Exposed: Yes Necrotic Quality: Adherent Slough Tendon Exposed: No Muscle Exposed: No Joint Exposed: No Bone Exposed: No Treatment Notes Wound #12 (Ankle) Wound Laterality: Left, Medial, Distal Cleanser Chad [name] ([id number]) [id number] Page 9 of 15 Soap and Water Discharge Instruction: Gently cleanse wound with antibacterial soap, rinse and pat dry prior to dressing wounds Wound Cleanser Discharge Instruction: Wash your hands with soap and water. Remove old dressing, discard into plastic bag and place into trash. Cleanse the wound with Wound Cleanser prior to applying a clean dressing using gauze sponges, not tissues or cotton balls. Do not scrub or use excessive force. Pat dry using gauze sponges, not tissue or cotton balls. Peri-Wound Care AandD Ointment Discharge Instruction: Apply AandD Ointment to areas of redness Topical Primary  Dressing Hydrofera Blue Ready Transfer Foam, 4x5 (in/in) Discharge Instruction: Apply Hydrofera Blue Ready to wound bed as directed Secondary Dressing ABD Pad 5x9 (in/in) Discharge Instruction: Cover with ABD pad Secured With [employer]  Sterile or Non-Sterile 6-ply 4.5x4 (yd/yd) Discharge Instruction: Apply Kerlix as directed Compression Wrap Compression Stockings Add-Ons Electronic Signature(s) Signed: 10/15/2022 8:15:46 AM By: Rosalio Loud MSN RN CNS WTA Entered By: Rosalio Loud on 10/14/2022 08:35:16 -------------------------------------------------------------------------------- Wound Assessment Details Patient Name: Date of Service: Chad [name], Chad [name] 10/14/2022 8:30 A M Medical Record Number: [id number] Patient Account Number: [id number] Date of Birth/Sex: Treating RN: 06/30/1961 (62 y.o. Seward Meth Primary Care Kimbely Whiteaker: [name] Other Clinician: Referring Jisela Merlino: Treating Aricka Goldberger/Extender: [name] in Treatment: 44 Wound Status Wound Number: 15 Primary Venous Leg Ulcer Etiology: Wound Location: Left, Dorsal Foot Wound Open Wounding Event: Gradually Appeared Status: Date Acquired: 10/01/2022 Comorbid Anemia, Chronic Obstructive Pulmonary Disease (COPD), Coronary Weeks Of Treatment: 1 History: Artery Disease, Peripheral Arterial Disease, Peripheral Venous Clustered Wound: No Disease, Hepatitis C, Osteoarthritis, Neuropathy, Received Chemotherapy Photos REQUAN, CALDARELLI ([id number] Page 10 of 15 Wound Measurements Length: (cm) 3 Width: (cm) 1 Depth: (cm) 0.2 Area: (cm) 2.356 Volume: (cm) 0.471 % Reduction in Area: 0% % Reduction in Volume: -99.6% Epithelialization: None Wound Description Classification: Full Thickness Without Exposed Support Structures Exudate Amount: Medium Exudate Type: Serosanguineous Exudate Color: red, brown Foul Odor After Cleansing: No Slough/Fibrino Yes Wound  Bed Granulation Amount: None Present (0%) Exposed Structure Necrotic Amount: Large (67-100%) Fascia Exposed: No Necrotic Quality: Adherent Slough Fat Layer (Subcutaneous Tissue) Exposed: No Tendon Exposed: No Muscle Exposed: No Joint Exposed: No Bone Exposed: No Treatment Notes Wound #15 (Foot) Wound Laterality: Dorsal, Left Cleanser Soap and Water Discharge Instruction: Gently cleanse wound with antibacterial soap, rinse and pat dry prior to dressing wounds Wound Cleanser Discharge Instruction: Wash your hands with soap and water. Remove old dressing, discard into plastic bag and place into trash. Cleanse the wound with Wound Cleanser prior to applying a clean dressing using gauze sponges, not tissues or cotton balls. Do not scrub or use excessive force. Pat dry using gauze sponges, not tissue or cotton balls. Peri-Wound Care AandD Ointment Discharge Instruction: Apply AandD Ointment to areas of redness Topical Santyl Collagenase Ointment, 30 (gm), tube Discharge Instruction: apply nickel thick to wound bed only Primary Dressing Gauze Discharge Instruction: As directed: dry, moistened with saline or moistened with Dakins Solution Secondary Dressing ABD Pad 5x9 (in/in) Discharge Instruction: Cover with ABD pad Secured With Kerlix Roll Sterile or Non-Sterile 6-ply 4.5x4 (yd/yd) Discharge Instruction: Apply Kerlix as directed Compression Wrap Compression Stockings Add-Ons Electronic Signature(s) Chad [name] ([id number] Page 11 of 15 Signed: 10/15/2022 8:15:46 AM By: Rosalio Loud MSN RN CNS WTA Entered By: Rosalio Loud on 10/14/2022 08:34:50 -------------------------------------------------------------------------------- Wound Assessment Details Patient Name: Date of Service: Chad [name], Chad [name] 10/14/2022 8:30 A M Medical Record Number: [id number] Patient Account Number: [id number] Date of Birth/Sex: Treating RN: March 09, 1961 (62 y.o. Seward Meth Primary Care Albina Gosney: [name] Other Clinician: Referring Koston Hennes: Treating Shayonna Ocampo/Extender: [name] in Treatment: 44 Wound Status Wound Number: 16 Primary Pressure Ulcer Etiology: Wound Location: Right Calcaneus Wound Open Wounding Event: Gradually Appeared Status: Date Acquired: 09/26/2022 Comorbid Anemia, Chronic Obstructive Pulmonary Disease (COPD), Coronary Weeks Of Treatment: 1 History: Artery Disease, Peripheral Arterial Disease, Peripheral Venous Clustered Wound: No Disease, Hepatitis C, Osteoarthritis, Neuropathy, Received Chemotherapy Photos Wound Measurements Length: (cm) 1 Width: (cm) 1 Depth: (cm) 0.1 Area: (cm) 0.785 Volume: (cm) 0.079 % Reduction in Area: 16.7% % Reduction in Volume: 58% Epithelialization: None Wound Description Classification: Category/Stage III Exudate Amount: Medium Exudate Type: Serosanguineous Exudate Color: red, brown Foul Odor After Cleansing:  No Slough/Fibrino Yes Wound Bed Granulation Amount: Medium (34-66%) Exposed Structure Granulation Quality: Red Fascia Exposed: No Necrotic Amount: Medium (34-66%) Fat Layer (Subcutaneous Tissue) Exposed: Yes Necrotic Quality: Adherent Slough Tendon Exposed: No Muscle Exposed: No Joint Exposed: No Bone Exposed: No Treatment Notes Wound #16 (Calcaneus) Wound Laterality: Right Chad [name] ([id number] Page 12 of 15 Cleanser Soap and Water Discharge Instruction: Gently cleanse wound with antibacterial soap, rinse and pat dry prior to dressing wounds Wound Cleanser Discharge Instruction: Wash your hands with soap and water. Remove old dressing, discard into plastic bag and place into trash. Cleanse the wound with Wound Cleanser prior to applying a clean dressing using gauze sponges, not tissues or cotton balls. Do not scrub or use excessive force. Pat dry using gauze sponges, not tissue or cotton  balls. Peri-Wound Care AandD Ointment Discharge Instruction: Apply AandD Ointment to areas of redness Topical Santyl Collagenase Ointment, 30 (gm), tube Discharge Instruction: apply nickel thick to wound bed only Primary Dressing Gauze Discharge Instruction: As directed: dry, moistened with saline or moistened with Dakins Solution Secondary Dressing ABD Pad 5x9 (in/in) Discharge Instruction: Cover with ABD pad Secured With Kerlix Roll Sterile or Non-Sterile 6-ply 4.5x4 (yd/yd) Discharge Instruction: Apply Kerlix as directed Compression Wrap Compression Stockings Add-Ons Electronic Signature(s) Signed: 10/15/2022 8:15:46 AM By: Rosalio Loud MSN RN CNS WTA Entered By: Rosalio Loud on 10/14/2022 08:35:43 -------------------------------------------------------------------------------- Wound Assessment Details Patient Name: Date of Service: Chad [name] 10/14/2022 8:30 A M Medical Record Number: [id number] Patient Account Number: [id number] Date of Birth/Sex: Treating RN: April 07, 1961 (62 y.o. Seward Meth Primary Care Jenissa Tyrell: [name] Other Clinician: Referring Kirat Mezquita: Treating Ronnell Clinger/Extender: [name] in Treatment: 44 Wound Status Wound Number: 17 Primary Pressure Ulcer Etiology: Wound Location: Left, Anterior Lower Leg Wound Open Wounding Event: Pressure Injury Status: Date Acquired: 09/30/2022 Comorbid Anemia, Chronic Obstructive Pulmonary Disease (COPD), Coronary Weeks Of Treatment: 0 History: Artery Disease, Peripheral Arterial Disease, Peripheral Venous Clustered Wound: No Disease, Hepatitis C, Osteoarthritis, Neuropathy, Received Chemotherapy Photos Chad [name], Chad [name] ([id number] Page 13 of 15 Wound Measurements Length: (cm) Width: (cm) Depth: (cm) Area: (cm) Volume: (cm) 2.5 % Reduction in Area: 2.1 % Reduction in Volume: 0.1 4.123 0.412 Wound Description Classification:  Category/Stage II Exudate Amount: Medium Exudate Type: Serosanguineous Exudate Color: red, brown Foul Odor After Cleansing: No Slough/Fibrino Yes Wound Bed Granulation Amount: Small (1-33%) Exposed Structure Granulation Quality: Red Fascia Exposed: No Necrotic Amount: Medium (34-66%) Fat Layer (Subcutaneous Tissue) Exposed: No Necrotic Quality: Eschar Tendon Exposed: No Muscle Exposed: No Joint Exposed: No Bone Exposed: No Treatment Notes Wound #17 (Lower Leg) Wound Laterality: Left, Anterior Cleanser Soap and Water Discharge Instruction: Gently cleanse wound with antibacterial soap, rinse and pat dry prior to dressing wounds Wound Cleanser Discharge Instruction: Wash your hands with soap and water. Remove old dressing, discard into plastic bag and place into trash. Cleanse the wound with Wound Cleanser prior to applying a clean dressing using gauze sponges, not tissues or cotton balls. Do not scrub or use excessive force. Pat dry using gauze sponges, not tissue or cotton balls. Peri-Wound Care AandD Ointment Discharge Instruction: Apply AandD Ointment to areas of redness Topical Primary Dressing Hydrofera Blue Ready Transfer Foam, 4x5 (in/in) Discharge Instruction: Apply Hydrofera Blue Ready to wound bed as directed Secondary Dressing ABD Pad 5x9 (in/in) Discharge Instruction: Cover with ABD pad Secured With Kerlix Roll Sterile or Non-Sterile 6-ply 4.5x4 (yd/yd) Discharge Instruction: Apply Kerlix as directed Compression Wrap Compression Stockings Add-Ons Electronic Signature(s)  Signed: 10/15/2022 8:15:46 AM By: Rosalio Loud MSN RN CNS WTA Entered By: Rosalio Loud on 10/14/2022 08:33:45 Chad [name] ([id number] Page 14 of 15 -------------------------------------------------------------------------------- Wound Assessment Details Patient Name: Date of Service: HIEP, RIPLEY 10/14/2022 8:30 A M Medical Record Number:  [id number] Patient Account Number: [id number] Date of Birth/Sex: Treating RN: Dec 27, 1960 (62 y.o. [place] Meth Primary Care Diera Wirkkala: [name] Other Clinician: Referring Oceane Fosse: Treating Jamiya Nims/Extender: [name] in Treatment: 44 Wound Status Wound Number: [id number] Primary Pressure Ulcer Etiology: Wound Location: Right, Medial T Second oe Wound Open Wounding Event: Pressure Injury Status: Date Acquired: 09/26/2022 Comorbid Anemia, Chronic Obstructive Pulmonary Disease (COPD), Coronary Weeks Of Treatment: 0 History: Artery Disease, Peripheral Arterial Disease, Peripheral Venous Clustered Wound: No Disease, Hepatitis C, Osteoarthritis, Neuropathy, Received Chemotherapy Photos Wound Measurements Length: (cm) 0.7 Width: (cm) 1 Depth: (cm) 0.1 Area: (cm) 0.55 Volume: (cm) 0.055 % Reduction in Area: % Reduction in Volume: Epithelialization: Small (1-33%) Tunneling: No Undermining: No Wound Description Classification: Category/Stage II Exudate Amount: Medium Exudate Type: Serosanguineous Exudate Color: red, brown Foul Odor After Cleansing: No Slough/Fibrino Yes Wound Bed Granulation Amount: Small (1-33%) Exposed Structure Granulation Quality: Red, Pink Fascia Exposed: No Necrotic Amount: Medium (34-66%) Fat Layer (Subcutaneous Tissue) Exposed: No Necrotic Quality: Adherent Slough Tendon Exposed: No Muscle Exposed: No Joint Exposed: No Bone Exposed: No Treatment Notes Wound #18 (Toe Second) Wound Laterality: Right, Medial Cleanser Chad [name] ([id number] Page 15 of 15 Soap and Water Discharge Instruction: Gently cleanse wound with antibacterial soap, rinse and pat dry prior to dressing wounds Wound Cleanser Discharge Instruction: Wash your hands with soap and water. Remove old dressing, discard into plastic bag and place into trash. Cleanse the wound with Wound Cleanser prior to applying a clean  dressing using gauze sponges, not tissues or cotton balls. Do not scrub or use excessive force. Pat dry using gauze sponges, not tissue or cotton balls. Peri-Wound Care AandD Ointment Discharge Instruction: Apply AandD Ointment to areas of redness Topical Santyl Collagenase Ointment, 30 (gm), tube Discharge Instruction: apply nickel thick to wound bed only Primary Dressing Gauze Discharge Instruction: As directed: dry, moistened with saline or moistened with Dakins Solution Secondary Dressing ABD Pad 5x9 (in/in) Discharge Instruction: Cover with ABD pad Secured With Kerlix Roll Sterile or Non-Sterile 6-ply 4.5x4 (yd/yd) Discharge Instruction: Apply Kerlix as directed Compression Wrap Compression Stockings Add-Ons Electronic Signature(s) Signed: 10/15/2022 8:15:46 AM By: Rosalio Loud MSN RN CNS WTA Entered By: Rosalio Loud on 10/14/2022 08:40:55 -------------------------------------------------------------------------------- [name] Details Patient Name: Date of Service: Chad [name] 10/14/2022 8:30 A M Medical Record Number: [id number] Patient Account Number: [id number] Date of Birth/Sex: Treating RN: [birth year] (62 y.o. [place] Meth Primary Care Linwood Gullikson: [name] Other Clinician: Referring Teodoro Jeffreys: Treating Frisco Cordts/Extender: [name] in Treatment: 44 Vital Signs Time Taken: 08:14 Temperature (F): 98.3 Height (in): 69 Pulse (bpm): 94 Weight (lbs): 150 Respiratory Rate (breaths/min): 16 Body Mass Index (BMI): 22.1 Blood Pressure (mmHg): 117/82 Reference Range: 80 - 120 mg / dl Electronic Signature(s) Signed: 10/14/2022 8:46:02 AM By: Rosalio Loud MSN RN CNS WTA Entered By: Rosalio Loud on 10/14/2022 08:46:01

## 2022-10-21 ENCOUNTER — Encounter: Payer: Medicaid Other | Admitting: Physician Assistant

## 2022-10-21 DIAGNOSIS — I87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 hypertension (idiopathic) with ulcer and inflammation of left lower extremity: Secondary | ICD-10-CM | POA: Diagnosis not present

## 2022-10-21 NOTE — Progress Notes (Signed)
Chad [name], Chad [name] ([id number]) [id number] Page 1 of 13 Visit Report for 10/21/2022 Arrival Information Details Patient Name: Date of Service: Chad [name], Chad [name] 10/21/2022 8:30 A M Medical Record Number: [id number] Patient Account Number: [id number] Date of Birth/Sex: Treating RN: 05-15-1961 (62 y.o. Seward Meth Primary Care Daisi Kentner: [name] Other Clinician: Referring Trevonn [name]: Treating Candice Tobey/Extender: [name] in Treatment: 78 Visit Information History Since Last Visit Added or deleted any medications: No Patient Arrived: Wheel Chair Has Dressing in Place as Prescribed: Yes Arrival Time: 08:27 Pain Present Now: No Accompanied By: self Transfer Assistance: None Patient Identification Verified: Yes Secondary Verification Process Completed: Yes Patient Requires Transmission-Based No Precautions: Patient Has Alerts: Yes Patient Alerts: Patient on Blood Thinner NOT diabetic aspirin 81mg  Lives [facility] Electronic Signature(s) Signed: 10/21/2022 11:46:48 AM By: Rosalio Loud MSN RN CNS WTA Entered By: Rosalio Loud on 10/21/2022 08:27:55 -------------------------------------------------------------------------------- Clinic Level of Care Assessment Details Patient Name: Date of Service: Chad [name], Chad [name] 10/21/2022 8:30 A M Medical Record Number: [id number] Patient Account Number: [id number] Date of Birth/Sex: Treating RN: September 15, 1960 (62 y.o. Seward Meth Primary Care Leilah Polimeni: [name] Other Clinician: Referring Oluwadara Gorman: Treating Joneisha Miles/Extender: [name] in Treatment: 45 Clinic Level of Care Assessment Items TOOL 4 Quantity Score []  - 0 Use when only an EandM is performed on FOLLOW-UP visit ASSESSMENTS - Nursing Assessment / Reassessment X- 1 10 Reassessment of Co-morbidities (includes updates in patient status) X- 1 5 Reassessment of Adherence to Treatment Plan ASSESSMENTS  - Wound and Skin A ssessment / Reassessment []  - 0 Simple Wound Assessment / Reassessment - one wound X- 5 5 Complex Wound Assessment / Reassessment - multiple wounds []  - 0 Dermatologic / Skin Assessment (not related to wound area) ASSESSMENTS - Focused Assessment []  - 0 Circumferential Edema Measurements - multi extremities []  - 0 Nutritional Assessment / Counseling / Intervention []  - 0 Lower Extremity Assessment (monofilament, tuning fork, pulses) []  - 0 Peripheral Arterial Disease Assessment (using hand held doppler) ASSESSMENTS - Ostomy and/or Continence Assessment and Care []  - 0 Incontinence Assessment and Management []  - 0 Ostomy Care Assessment and Management (repouching, etc.) PROCESS - Coordination of Care Chad [name], Chad [name] ([id number]) [id number] Page 2 of 13 []  - 0 Simple Patient / Family Education for ongoing care []  - 0 Complex (extensive) Patient / Family Education for ongoing care X- 1 10 Staff obtains Programmer, systems, Records, T Results / Process Orders est []  - 0 Staff telephones HHA, Nursing Homes / Clarify orders / etc []  - 0 Routine Transfer to another Facility (non-emergent condition) []  - 0 Routine Hospital Admission (non-emergent condition) []  - 0 New Admissions / Biomedical engineer / Ordering NPWT Apligraf, etc. , []  - 0 Emergency Hospital Admission (emergent condition) []  - 0 Simple Discharge Coordination X- 1 15 Complex (extensive) Discharge Coordination PROCESS - Special Needs []  - 0 Pediatric / Minor Patient Management []  - 0 Isolation Patient Management []  - 0 Hearing / Language / Visual special needs []  - 0 Assessment of Community assistance (transportation, D/C planning, etc.) []  - 0 Additional assistance / Altered mentation []  - 0 Support Surface(s) Assessment (bed, cushion, seat, etc.) INTERVENTIONS - Wound Cleansing / Measurement []  - 0 Simple Wound Cleansing - one wound X- 5 5 Complex Wound  Cleansing - multiple wounds X- 1 5 Wound Imaging (photographs - any number of wounds) []  - 0 Wound Tracing (instead of photographs) []  - 0 Simple Wound Measurement - one wound X- 5 5 Complex  Wound Measurement - multiple wounds INTERVENTIONS - Wound Dressings []  - 0 Small Wound Dressing one or multiple wounds X- 5 15 Medium Wound Dressing one or multiple wounds []  - 0 Large Wound Dressing one or multiple wounds []  - 0 Application of Medications - topical []  - 0 Application of Medications - injection INTERVENTIONS - Miscellaneous []  - 0 External ear exam []  - 0 Specimen Collection (cultures, biopsies, blood, body fluids, etc.) []  - 0 Specimen(s) / Culture(s) sent or taken to Lab for analysis []  - 0 Patient Transfer (multiple staff / Civil Service fast streamer / Similar devices) []  - 0 Simple Staple / Suture removal (25 or less) []  - 0 Complex Staple / Suture removal (26 or more) []  - 0 Hypo / Hyperglycemic Management (close monitor of Blood Glucose) []  - 0 Ankle / Brachial Index (ABI) - do not check if billed separately X- 1 5 Vital Signs Has the patient been seen at the hospital within the last three years: Yes Total Score: 200 Level Of Care: New/Established - Level 5 Electronic Signature(s) Signed: 10/21/2022 11:46:48 AM By: Rosalio Loud MSN RN CNS Kemper Durie, [name] ([id number]) [id number] Page 3 of 13 Entered By: Rosalio Loud on 10/21/2022 09:29:55 -------------------------------------------------------------------------------- Encounter Discharge Information Details Patient Name: Date of Service: Chad [name], Chad [name] 10/21/2022 8:30 A M Medical Record Number: [id number] Patient Account Number: [id number] Date of Birth/Sex: Treating RN: 1961-06-10 (62 y.o. Seward Meth Primary Care Shawndra Clute: [name] Other Clinician: Referring Miley Lindon: Treating Corry Ihnen/Extender: [name] in Treatment: 45 Encounter Discharge Information  Items Discharge Condition: Stable Ambulatory Status: Wheelchair Discharge Destination: Home Transportation: Private Auto Accompanied By: self Schedule Follow-up Appointment: Yes Clinical Summary of Care: Electronic Signature(s) Signed: 10/21/2022 11:46:48 AM By: Rosalio Loud MSN RN CNS WTA Entered By: Rosalio Loud on 10/21/2022 09:31:06 -------------------------------------------------------------------------------- Lower Extremity Assessment Details Patient Name: Date of Service: Chad [name], Chad [name] 10/21/2022 8:30 A M Medical Record Number: [id number] Patient Account Number: [id number] Date of Birth/Sex: Treating RN: [birth year] (62 y.o. Seward Meth Primary Care Delmont Prosch: [name] Other Clinician: Referring Kaydynce Pat: Treating Makenley Shimp/Extender: [name] in Treatment: 45 Edema Assessment Assessed: Shirlyn Goltz: Yes] Patrice Paradise: Yes] Edema: [Left: Yes] [Right: Yes] Calf Left: Right: Point of Measurement: 33 cm From Medial Instep 37.5 cm 35 cm Ankle Left: Right: Point of Measurement: 12 cm From Medial Instep 23.3 cm 22.5 cm Vascular Assessment Pulses: Dorsalis Pedis Palpable: [Left:Yes] Electronic Signature(s) Signed: 10/21/2022 11:46:48 AM By: Rosalio Loud MSN RN CNS WTA Entered By: Rosalio Loud on 10/21/2022 09:27:29 -------------------------------------------------------------------------------- Multi Wound Chart Details Patient Name: Date of Service: Chad [name] 10/21/2022 8:30 A M Medical Record Number: [id number] Patient Account Number: [id number] Date of Birth/Sex: Treating RN: Feb 03, 1961 (62 y.o. Seward Meth Primary Care Alane Hanssen: [name] Other Clinician: Referring Chanson Teems: Treating Siearra Amberg/Extender: [name] [place], [name] ([id number]) [id number] Page 4 of 13 Weeks in Treatment: 45 Vital Signs Height(in): 69 Pulse(bpm): 92 Weight(lbs): 150 Blood Pressure(mmHg): 114/79 Body Mass  Index(BMI): 22.1 Temperature(F): 97.7 Respiratory Rate(breaths/min): 16 [12:Photos:] Left, Distal, Medial Ankle Left, Dorsal Foot Right Calcaneus Wound Location: Gradually Appeared Gradually Appeared Gradually Appeared Wounding Event: Venous Leg Ulcer Venous Leg Ulcer Pressure Ulcer Primary Etiology: Anemia, Chronic Obstructive Anemia, Chronic Obstructive Anemia, Chronic Obstructive Comorbid History: Pulmonary Disease (COPD), Coronary Pulmonary Disease (COPD), Coronary Pulmonary Disease (COPD), Coronary Artery Disease, Peripheral Arterial Artery Disease, Peripheral Arterial Artery Disease, Peripheral Arterial Disease, Peripheral Venous Disease, Disease, Peripheral Venous Disease, Disease, Peripheral Venous Disease, Hepatitis C, Osteoarthritis, Hepatitis C,  Osteoarthritis, Hepatitis C, Osteoarthritis, Neuropathy, Received Chemotherapy Neuropathy, Received Chemotherapy Neuropathy, Received Chemotherapy 07/12/2019 10/01/2022 09/26/2022 Date Acquired: 59 2 2 Weeks of Treatment: Open Open Open Wound Status: No No No Wound Recurrence: 2.3x0.7x0.2 3.3x1.2x0.1 1.6x1.2x0.1 Measurements L x W x D (cm) 1.264 3.11 1.508 A (cm) : rea 0.253 0.311 0.151 Volume (cm) : 81.70% -32.00% -60.10% % Reduction in Area: 81.70% -31.80% 19.70% % Reduction in Volume: Full Thickness Without Exposed Full Thickness Without Exposed Category/Stage III Classification: Support Structures Support Structures Medium Medium Medium Exudate A mount: Serosanguineous Serosanguineous Serosanguineous Exudate Type: red, brown red, brown red, brown Exudate Color: Distinct, outline attached N/A N/A Wound Margin: Large (67-100%) None Present (0%) Medium (34-66%) Granulation Amount: Pink N/A Red Granulation Quality: Small (1-33%) Large (67-100%) Medium (34-66%) Necrotic Amount: Adherent [place] Necrotic Tissue: Fat Layer (Subcutaneous Tissue): Yes Fascia: No Fat Layer  (Subcutaneous Tissue): Yes Exposed Structures: Fascia: No Fat Layer (Subcutaneous Tissue): No Fascia: No Tendon: No Tendon: No Tendon: No Muscle: No Muscle: No Muscle: No Joint: No Joint: No Joint: No Bone: No Bone: No Bone: No None None None Epithelialization: Wound Number: [id number] 18 N/A Photos: N/A Left, Anterior Lower Leg Right, Medial T Second oe N/A Wound Location: Pressure Injury Pressure Injury N/A Wounding Event: Pressure Ulcer Pressure Ulcer N/A Primary Etiology: Anemia, Chronic Obstructive Anemia, Chronic Obstructive N/A Comorbid History: Pulmonary Disease (COPD), Coronary Pulmonary Disease (COPD), Coronary Artery Disease, Peripheral Arterial Artery Disease, Peripheral Arterial Disease, Peripheral Venous Disease, Disease, Peripheral Venous Disease, Hepatitis C, Osteoarthritis, Hepatitis C, Osteoarthritis, Neuropathy, Received Chemotherapy Neuropathy, Received Chemotherapy 09/30/2022 09/26/2022 N/A Date Acquired: 1 1 N/A Weeks of Treatment: Open Open N/A Wound Status: No No N/A Wound Recurrence: 3.2x2.5x0.1 0.8x0.7x0.1 N/A Measurements L x W x D (cm) 6.283 0.44 N/A A (cm) : Randa Evens ([id number]) [id number] Page 5 of 13 0.628 0.044 N/A Volume (cm) : -52.40% 20.00% N/A % Reduction in A rea: -52.40% 20.00% N/A % Reduction in Volume: Category/Stage II Category/Stage II N/A Classification: Medium Medium N/A Exudate A mount: Serosanguineous Serosanguineous N/A Exudate Type: red, brown red, brown N/A Exudate Color: N/A N/A N/A Wound Margin: Small (1-33%) Small (1-33%) N/A Granulation A mount: Red Red, Pink N/A Granulation Quality: Medium (34-66%) Medium (34-66%) N/A Necrotic A mount: Eschar Adherent Slough N/A Necrotic Tissue: Fascia: No Fascia: No N/A Exposed Structures: Fat Layer (Subcutaneous Tissue): No Fat Layer (Subcutaneous Tissue): No Tendon: No Tendon: No Muscle: No Muscle: No Joint: No Joint:  No Bone: No Bone: No N/A Small (1-33%) N/A Epithelialization: Treatment Notes Electronic Signature(s) Signed: 10/21/2022 11:46:48 AM By: Rosalio Loud MSN RN CNS WTA Entered By: Rosalio Loud on 10/21/2022 09:27:33 -------------------------------------------------------------------------------- Multi-Disciplinary Care Plan Details Patient Name: Date of Service: Chad [name] 10/21/2022 8:30 A M Medical Record Number: [id number] Patient Account Number: [id number] Date of Birth/Sex: Treating RN: 10/31/1960 (62 y.o. Seward Meth Primary Care Valleri Hendricksen: [name] Other Clinician: Referring Lera Gaines: Treating Jeff Frieden/Extender: [name] in Treatment: 45 Active Inactive Venous Leg Ulcer Nursing Diagnoses: Knowledge deficit related to disease process and management Goals: Patient will maintain optimal edema control Date Initiated: 12/06/2021 Date Inactivated: 12/27/2021 Target Resolution Date: 01/03/2022 Goal Status: Met Patient/caregiver will verbalize understanding of disease process and disease management Date Initiated: 12/06/2021 Target Resolution Date: 10/28/2022 Goal Status: Active Interventions: Assess peripheral edema status every visit. Compression as ordered Notes: Electronic Signature(s) Signed: 10/21/2022 11:46:48 AM By: Rosalio Loud MSN RN CNS WTA Entered By: Rosalio Loud on 10/21/2022 09:30:13 -------------------------------------------------------------------------------- Pain  Assessment Details Patient Name: Date of Service: Chad [name], Chad [name] 10/21/2022 8:30 A M Medical Record Number: [id number] Patient Account Number: [id number] Date of Birth/Sex: Treating RN: [birth year] (62 y.o. Seward Meth Primary Care Huan Pollok: [name] Other Clinician: Referring Wilene Pharo: Treating Dray Dente/Extender: [name] in Treatment: [address] [place] ([id number]) [id number] Page 6 of 13 Active  Problems Location of Pain Severity and Description of Pain Patient Has Paino No Site Locations Pain Management and Medication Current Pain Management: Electronic Signature(s) Signed: 10/21/2022 11:46:48 AM By: Rosalio Loud MSN RN CNS WTA Entered By: Rosalio Loud on 10/21/2022 08:35:11 -------------------------------------------------------------------------------- Patient/Caregiver Education Details Patient Name: Date of Service: [place], RO [name] 3/26/2024andnbsp8:30 A M Medical Record Number: [id number] Patient Account Number: [id number] Date of Birth/Gender: Treating RN: [birth year] (62 y.o. Seward Meth Primary Care Physician: [name] Other Clinician: Referring Physician: Treating Physician/Extender: [name] in Treatment: 32 Education Assessment Education Provided To: Patient and Caregiver Education Topics Provided Wound/Skin Impairment: Handouts: Caring for Your Ulcer Methods: Explain/Verbal Responses: State content correctly Electronic Signature(s) Signed: 10/21/2022 11:46:48 AM By: Rosalio Loud MSN RN CNS WTA Entered By: Rosalio Loud on 10/21/2022 09:30:26 -------------------------------------------------------------------------------- Wound Assessment Details Patient Name: Date of Service: Chad [name], Chad [name] 10/21/2022 8:30 A M Medical Record Number: [id number] Patient Account Number: [id number] Date of Birth/Sex: Treating RN: 14-Mar-1961 (62 y.o. Seward Meth Primary Care Lewie Deman: [name] Other Clinician: Referring Andrell Bergeson: Treating Alura Olveda/Extender: [name] in Treatment: [address], [place] ([id number]) [id number] Page 7 of 13 Wound Status Wound Number: 12 Primary Venous Leg Ulcer Etiology: Wound Location: Left, Distal, Medial Ankle Wound Open Wounding Event: Gradually Appeared Status: Date Acquired: 07/12/2019 Comorbid Anemia, Chronic Obstructive Pulmonary Disease (COPD),  Coronary Weeks Of Treatment: 45 History: Artery Disease, Peripheral Arterial Disease, Peripheral Venous Clustered Wound: No Disease, Hepatitis C, Osteoarthritis, Neuropathy, Received Chemotherapy Photos Wound Measurements Length: (cm) 2.3 Width: (cm) 0.7 Depth: (cm) 0.2 Area: (cm) 1.264 Volume: (cm) 0.253 % Reduction in Area: 81.7% % Reduction in Volume: 81.7% Epithelialization: None Wound Description Classification: Full Thickness Without Exposed Support Structures Wound Margin: Distinct, outline attached Exudate Amount: Medium Exudate Type: Serosanguineous Exudate Color: red, brown Foul Odor After Cleansing: No Slough/Fibrino Yes Wound Bed Granulation Amount: Large (67-100%) Exposed Structure Granulation Quality: Pink Fascia Exposed: No Necrotic Amount: Small (1-33%) Fat Layer (Subcutaneous Tissue) Exposed: Yes Necrotic Quality: Adherent Slough Tendon Exposed: No Muscle Exposed: No Joint Exposed: No Bone Exposed: No Treatment Notes Wound #12 (Ankle) Wound Laterality: Left, Medial, Distal Cleanser Soap and Water Discharge Instruction: Gently cleanse wound with antibacterial soap, rinse and pat dry prior to dressing wounds Wound Cleanser Discharge Instruction: Wash your hands with soap and water. Remove old dressing, discard into plastic bag and place into trash. Cleanse the wound with Wound Cleanser prior to applying a clean dressing using gauze sponges, not tissues or cotton balls. Do not scrub or use excessive force. Pat dry using gauze sponges, not tissue or cotton balls. Peri-Wound Care AandD Ointment Discharge Instruction: Apply AandD Ointment to areas of redness Topical Primary Dressing Hydrofera Blue Ready Transfer Foam, 4x5 (in/in) Discharge Instruction: Apply Hydrofera Blue Ready to wound bed as directed Secondary Dressing ABD Pad 5x9 (in/in) Discharge Instruction: Cover with ABD pad Secured With [name] ([id number])  [id number] Page 8 of 13 Kerlix Roll Sterile or Non-Sterile 6-ply 4.5x4 (yd/yd) Discharge Instruction: Apply Kerlix as directed Compression Wrap Compression Stockings Add-Ons Electronic Signature(s) Signed: 10/21/2022 11:46:48 AM By: Rosalio Loud MSN RN CNS WTA Entered  By: Rosalio Loud on 10/21/2022 08:48:35 -------------------------------------------------------------------------------- Wound Assessment Details Patient Name: Date of Service: Chad [name], Chad [name] 10/21/2022 8:30 A M Medical Record Number: [id number] Patient Account Number: [id number] Date of Birth/Sex: Treating RN: May 23, 1961 (62 y.o. Seward Meth Primary Care Marsha Hillman: [name] Other Clinician: Referring Ahava Kissoon: Treating Kersti Scavone/Extender: [name] in Treatment: 57 Wound Status Wound Number: 15 Primary Venous Leg Ulcer Etiology: Wound Location: Left, Dorsal Foot Wound Open Wounding Event: Gradually Appeared Status: Date Acquired: 10/01/2022 Comorbid Anemia, Chronic Obstructive Pulmonary Disease (COPD), Coronary Weeks Of Treatment: 2 History: Artery Disease, Peripheral Arterial Disease, Peripheral Venous Clustered Wound: No Disease, Hepatitis C, Osteoarthritis, Neuropathy, Received Chemotherapy Photos Wound Measurements Length: (cm) 3.3 Width: (cm) 1.2 Depth: (cm) 0.1 Area: (cm) 3.11 Volume: (cm) 0.311 % Reduction in Area: -32% % Reduction in Volume: -31.8% Epithelialization: None Wound Description Classification: Full Thickness Without Exposed Support Structures Exudate Amount: Medium Exudate Type: Serosanguineous Exudate Color: red, brown Foul Odor After Cleansing: No Slough/Fibrino Yes Wound Bed Granulation Amount: None Present (0%) Exposed Structure Necrotic Amount: Large (67-100%) Fascia Exposed: No Necrotic Quality: Adherent Slough Fat Layer (Subcutaneous Tissue) Exposed: No Tendon Exposed: No Muscle Exposed: No Joint Exposed: No Bone  Exposed: No Treatment Notes Wound #15 (Foot) Wound Laterality: Dorsal, Left [name] ([id number]) [id number] Page 9 of 13 Cleanser Soap and Water Discharge Instruction: Gently cleanse wound with antibacterial soap, rinse and pat dry prior to dressing wounds Wound Cleanser Discharge Instruction: Wash your hands with soap and water. Remove old dressing, discard into plastic bag and place into trash. Cleanse the wound with Wound Cleanser prior to applying a clean dressing using gauze sponges, not tissues or cotton balls. Do not scrub or use excessive force. Pat dry using gauze sponges, not tissue or cotton balls. Peri-Wound Care AandD Ointment Discharge Instruction: Apply AandD Ointment to areas of redness Topical Santyl Collagenase Ointment, 30 (gm), tube Discharge Instruction: apply nickel thick to wound bed only Primary Dressing Gauze Discharge Instruction: As directed: dry, moistened with saline or moistened with Dakins Solution Secondary Dressing ABD Pad 5x9 (in/in) Discharge Instruction: Cover with ABD pad Secured With Kerlix Roll Sterile or Non-Sterile 6-ply 4.5x4 (yd/yd) Discharge Instruction: Apply Kerlix as directed Compression Wrap Compression Stockings Add-Ons Electronic Signature(s) Signed: 10/21/2022 11:46:48 AM By: Rosalio Loud MSN RN CNS WTA Entered By: Rosalio Loud on 10/21/2022 08:49:02 -------------------------------------------------------------------------------- Wound Assessment Details Patient Name: Date of Service: Chad [name] 10/21/2022 8:30 A M Medical Record Number: [id number] Patient Account Number: [id number] Date of Birth/Sex: Treating RN: Dec 15, 1960 (62 y.o. Seward Meth Primary Care Kahdijah Errickson: [name] Other Clinician: Referring Chad [name]: Treating Shanika Levings/Extender: [name] in Treatment: 57 Wound Status Wound Number: 16 Primary Pressure Ulcer Etiology: Wound Location: Right  Calcaneus Wound Open Wounding Event: Gradually Appeared Status: Date Acquired: 09/26/2022 Comorbid Anemia, Chronic Obstructive Pulmonary Disease (COPD), Coronary Weeks Of Treatment: 2 History: Artery Disease, Peripheral Arterial Disease, Peripheral Venous Clustered Wound: No Disease, Hepatitis C, Osteoarthritis, Neuropathy, Received Chemotherapy Photos Wound Measurements Fuelling, Joshual ([id number]) Length: (cm) 1.6 Width: (cm) 1.2 Depth: (cm) 0.1 Area: (cm) 1.508 Volume: (cm) 0.151 [id number] Page 10 of 13 % Reduction in Area: -60.1% % Reduction in Volume: 19.7% Epithelialization: None Wound Description Classification: Category/Stage III Exudate Amount: Medium Exudate Type: Serosanguineous Exudate Color: red, brown Foul Odor After Cleansing: No Slough/Fibrino Yes Wound Bed Granulation Amount: Medium (34-66%) Exposed Structure Granulation Quality: Red Fascia Exposed: No Necrotic Amount: Medium (34-66%) Fat Layer (Subcutaneous Tissue) Exposed: Yes Necrotic Quality: Adherent Slough  Tendon Exposed: No Muscle Exposed: No Joint Exposed: No Bone Exposed: No Treatment Notes Wound #16 (Calcaneus) Wound Laterality: Right Cleanser Soap and Water Discharge Instruction: Gently cleanse wound with antibacterial soap, rinse and pat dry prior to dressing wounds Wound Cleanser Discharge Instruction: Wash your hands with soap and water. Remove old dressing, discard into plastic bag and place into trash. Cleanse the wound with Wound Cleanser prior to applying a clean dressing using gauze sponges, not tissues or cotton balls. Do not scrub or use excessive force. Pat dry using gauze sponges, not tissue or cotton balls. Peri-Wound Care AandD Ointment Discharge Instruction: Apply AandD Ointment to areas of redness Topical Santyl Collagenase Ointment, 30 (gm), tube Discharge Instruction: apply nickel thick to wound bed only Primary Dressing Gauze Discharge  Instruction: As directed: dry, moistened with saline or moistened with Dakins Solution Secondary Dressing ABD Pad 5x9 (in/in) Discharge Instruction: Cover with ABD pad Secured With Kerlix Roll Sterile or Non-Sterile 6-ply 4.5x4 (yd/yd) Discharge Instruction: Apply Kerlix as directed Compression Wrap Compression Stockings Add-Ons Electronic Signature(s) Signed: 10/21/2022 11:46:48 AM By: Rosalio Loud MSN RN CNS WTA Entered By: Rosalio Loud on 10/21/2022 08:54:06 -------------------------------------------------------------------------------- Wound Assessment Details Patient Name: Date of Service: Chad [name] 10/21/2022 8:30 A M Medical Record Number: [id number] Patient Account Number: [id number] Date of Birth/Sex: Treating RN: 09-Dec-1960 (62 y.o. Seward Meth Primary Care Kalai Baca: [name] Other Clinician: Referring [place] Sarinana: Treating Arayna Illescas/Extender: [name] in Treatment: [address] [place] ([id number]) [id number] Page 11 of 13 Wound Status Wound Number: 17 Primary Pressure Ulcer Etiology: Wound Location: Left, Anterior Lower Leg Wound Open Wounding Event: Pressure Injury Status: Date Acquired: 09/30/2022 Comorbid Anemia, Chronic Obstructive Pulmonary Disease (COPD), Coronary Weeks Of Treatment: 1 History: Artery Disease, Peripheral Arterial Disease, Peripheral Venous Clustered Wound: No Disease, Hepatitis C, Osteoarthritis, Neuropathy, Received Chemotherapy Photos Wound Measurements Length: (cm) 3.2 Width: (cm) 2.5 Depth: (cm) 0.1 Area: (cm) 6.283 Volume: (cm) 0.628 % Reduction in Area: -52.4% % Reduction in Volume: -52.4% Wound Description Classification: Category/Stage II Exudate Amount: Medium Exudate Type: Serosanguineous Exudate Color: red, brown Foul Odor After Cleansing: No Slough/Fibrino Yes Wound Bed Granulation Amount: Small (1-33%) Exposed Structure Granulation Quality: Red Fascia  Exposed: No Necrotic Amount: Medium (34-66%) Fat Layer (Subcutaneous Tissue) Exposed: No Necrotic Quality: Eschar Tendon Exposed: No Muscle Exposed: No Joint Exposed: No Bone Exposed: No Treatment Notes Wound #17 (Lower Leg) Wound Laterality: Left, Anterior Cleanser Soap and Water Discharge Instruction: Gently cleanse wound with antibacterial soap, rinse and pat dry prior to dressing wounds Wound Cleanser Discharge Instruction: Wash your hands with soap and water. Remove old dressing, discard into plastic bag and place into trash. Cleanse the wound with Wound Cleanser prior to applying a clean dressing using gauze sponges, not tissues or cotton balls. Do not scrub or use excessive force. Pat dry using gauze sponges, not tissue or cotton balls. Peri-Wound Care AandD Ointment Discharge Instruction: Apply AandD Ointment to areas of redness Topical Santyl Collagenase Ointment, 30 (gm), tube Discharge Instruction: apply nickel thick to wound bed only Primary Dressing Secondary Dressing ABD Pad 5x9 (in/in) Discharge Instruction: Cover with ABD pad Secured With [employer] Sterile or Non-Sterile 6-ply 4.5x4 (yd/yd) [name] ([id number]) [id number] Page 12 of 13 Discharge Instruction: Apply Kerlix as directed Compression Wrap Compression Stockings Add-Ons Electronic Signature(s) Signed: 10/21/2022 11:46:48 AM By: Rosalio Loud MSN RN CNS WTA Entered By: Rosalio Loud on 10/21/2022 08:49:53 -------------------------------------------------------------------------------- Wound Assessment Details Patient Name: Date of Service: Chad [name] 10/21/2022 8:30  A M Medical Record Number: [id number] Patient Account Number: [id number] Date of Birth/Sex: Treating RN: 1961/03/16 (62 y.o. [place] Meth Primary Care Nechemia Chiappetta: [name] Other Clinician: Referring Azaria Bartell: Treating Beauregard Jarrells/Extender: [name] in Treatment: 79 Wound  Status Wound Number: 18 Primary Pressure Ulcer Etiology: Wound Location: Right, Medial T Second oe Wound Open Wounding Event: Pressure Injury Status: Date Acquired: 09/26/2022 Comorbid Anemia, Chronic Obstructive Pulmonary Disease (COPD), Coronary Weeks Of Treatment: 1 History: Artery Disease, Peripheral Arterial Disease, Peripheral Venous Clustered Wound: No Disease, Hepatitis C, Osteoarthritis, Neuropathy, Received Chemotherapy Photos Wound Measurements Length: (cm) 0.8 Width: (cm) 0.7 Depth: (cm) 0.1 Area: (cm) 0.44 Volume: (cm) 0.044 % Reduction in Area: 20% % Reduction in Volume: 20% Epithelialization: Small (1-33%) Wound Description Classification: Category/Stage II Exudate Amount: Medium Exudate Type: Serosanguineous Exudate Color: red, brown Foul Odor After Cleansing: No Slough/Fibrino Yes Wound Bed Granulation Amount: Small (1-33%) Exposed Structure Granulation Quality: Red, Pink Fascia Exposed: No Necrotic Amount: Medium (34-66%) Fat Layer (Subcutaneous Tissue) Exposed: No Necrotic Quality: Adherent Slough Tendon Exposed: No Muscle Exposed: No Joint Exposed: No Bone Exposed: No Treatment Notes Wound #18 (Toe Second) Wound Laterality: Right, Medial [name] ([id number]) [id number] Page 13 of 13 Cleanser Soap and Water Discharge Instruction: Gently cleanse wound with antibacterial soap, rinse and pat dry prior to dressing wounds Wound Cleanser Discharge Instruction: Wash your hands with soap and water. Remove old dressing, discard into plastic bag and place into trash. Cleanse the wound with Wound Cleanser prior to applying a clean dressing using gauze sponges, not tissues or cotton balls. Do not scrub or use excessive force. Pat dry using gauze sponges, not tissue or cotton balls. Peri-Wound Care AandD Ointment Discharge Instruction: Apply AandD Ointment to areas of redness Topical Santyl Collagenase Ointment, 30 (gm),  tube Discharge Instruction: apply nickel thick to wound bed only Primary Dressing Gauze Discharge Instruction: As directed: dry, moistened with saline or moistened with Dakins Solution Secondary Dressing ABD Pad 5x9 (in/in) Discharge Instruction: Cover with ABD pad Secured With Kerlix Roll Sterile or Non-Sterile 6-ply 4.5x4 (yd/yd) Discharge Instruction: Apply Kerlix as directed Compression Wrap Compression Stockings Add-Ons Electronic Signature(s) Signed: 10/21/2022 11:46:48 AM By: Rosalio Loud MSN RN CNS WTA Entered By: Rosalio Loud on 10/21/2022 08:50:15 -------------------------------------------------------------------------------- Vitals Details Patient Name: Date of Service: Chad [name] 10/21/2022 8:30 A M Medical Record Number: [id number] Patient Account Number: [id number] Date of Birth/Sex: Treating RN: [birth year] (62 y.o. [place] Meth Primary Care Lupe Handley: [name] Other Clinician: Referring Dannica Bickham: Treating Brysten Reister/Extender: [name] in Treatment: 45 Vital Signs Time Taken: 08:34 Temperature (F): 97.7 Height (in): 69 Pulse (bpm): 92 Weight (lbs): 150 Respiratory Rate (breaths/min): 16 Body Mass Index (BMI): 22.1 Blood Pressure (mmHg): 114/79 Reference Range: 80 - 120 mg / dl Electronic Signature(s) Signed: 10/21/2022 11:46:48 AM By: Rosalio Loud MSN RN CNS WTA Entered By: Rosalio Loud on 10/21/2022 08:35:05

## 2022-10-21 NOTE — Progress Notes (Signed)
MICHAELRYAN, MOESSNER ([id number]) [id number] Page 1 of 13 Visit Report for 10/21/2022 Chief Complaint Document Details Patient Name: Date of Service: Chad [name], Chad [name] 10/21/2022 8:30 A M Medical Record Number: [id number] Patient Account Number: [id number] Date of Birth/Sex: Treating RN: August 07, 1960 (62 y.o. [place] Meth Primary Care Provider: [name] Other Clinician: Referring Provider: Treating Provider/Extender: [name] in Treatment: 53 Information Obtained from: Patient Chief Complaint Left ankle ulcer, Left dorsal foot wound, right heel Electronic Signature(s) Signed: 10/21/2022 9:11:07 AM By: Worthy Keeler PA-C Entered By: Worthy Keeler on 10/21/2022 09:11:06 -------------------------------------------------------------------------------- HPI Details Patient Name: Date of Service: Chad [name] 10/21/2022 8:30 A M Medical Record Number: [id number] Patient Account Number: [id number] Date of Birth/Sex: Treating RN: 08-17-1960 (62 y.o. [place] Meth Primary Care Provider: [name] Other Clinician: Referring Provider: Treating Provider/Extender: [name] in Treatment: 34 History of Present Illness HPI Description: 10/08/18 on evaluation today patient actually presents to our office for initial evaluation concerning wounds that he has of the bilateral lower extremities. 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 He also has bilateral venous stasis. He apparently has been homeless currently following discharge from the hospital apparently he has been placed at [facility] which is is a skilled nursing facility locally. Nonetheless fortunately he does not show any signs of infection at this time which is good news. In fact several of the  wound actually appears to be showing some signs of improvement already in my pinion. There are a couple areas in the left leg in particular there likely gonna require some sharp debridement to help clear away some necrotic tissue and help with more sufficient healing. No fevers, chills, nausea, or vomiting noted at this time. 10/15/18 on evaluation today patient actually appears to be doing very well in regard to his bilateral lower extremities. He's been tolerating the dressing changes without complication. Fortunately there does not appear to be any evidence of active infection at this time which is great news. Overall I'm actually very pleased with how this has progressed in just one visits time. Readmission: 08/14/2020 upon evaluation today patient presents for re-evaluation here in our clinic. He is having issues with his left ankle region as well as his right toe and his right heel. He tells me that the toe and heel actually began as a area that was itching that he was scratching and then subsequently opened up into wounds. These may have been abscess areas I presume based on what I am seeing currently. With regard to his left ankle region he tells me this was a similar type occurrence although he does have venous stasis this very well may be more of a venous leg ulcer more than anything. Nonetheless I do believe that the patient would benefit from appropriate and aggressive wound care to try to help get things under better control here. He does have history of a stroke on the left side affecting him to some degree there that he is able to stand although he does have some residual weakness. Otherwise again the patient does have chronic venous insufficiency as previously noted. His arterial studies most recently obtained showed that he had an ABI on the right of 1.16 with a TBI of 0.52 and on the left and ABI of 1.14 with a TBI of 0.81. That was  obtained on 06/19/2020. 08/28/2020 upon evaluation  today patient appears to be doing decently well in regard to his wounds in general. He has been tolerating the dressing changes without complication. Fortunately there does not appear to be any signs of active infection which is great news. With that being said I think the [facility] is doing a good job I would recommend that we likely continue with that currently. 09/11/2020 upon evaluation today patient's wounds did not appear to be doing too poorly but again he is not really showing signs of significant improvement with regard to any of the wounds on the right. None of them have Hydrofera Blue on them I am not exactly sure why this is not being followed as the facility did not contact us to let us know of any issues with obtaining dressings or otherwise. With that being said he is supposed to be using Hydrofera Blue on both of the wounds on the right foot as well as the ankle wound on the left side. 09/18/2020 upon evaluation today patient appears to be doing poorly with regard to his wounds. Again right now the left ankle in particular showed signs of extreme maceration. Apparently he was told by someone with staff at [place] they could not get the [facility]. With that being said this is something that is never been relayed to [place] one way or another. Also the patient subsequently has not supposed to have a border gauze dressing on. He should have an ABD pad and roll gauze to secure as this drains much too much just to have a border gauze dressing to cover. Nonetheless the fact that they are not using the appropriate dressing is directly causing deterioration of the left ankle wound it is significantly worse today compared to what it was previous. I did attempt to call [facility] while the patient was here I called three times and got no one to even pick up the phone. After this I had my for an office coordinator call and she was able to finally get through and leave a  message with the D ON as of dictation of this note which is roughly about an hour and a half later I still have not been able to speak with anyone at the facility. 09/25/2020 upon evaluation today patient actually showing signs of good improvement which is excellent news. He has been tolerating the dressing changes BOBAK, PYNES ([id number]) [id number] Page 2 of 13 without complication. Fortunately there is no signs of active infection which is great news. No fevers, chills, nausea, vomiting, or diarrhea. I do feel like the facility has been doing a much better job at taking care of him as far as the dressings are concerned. However the director of nursing never did call me back. 10/09/2020 upon evaluation today patient appears to be doing well with regard to his wound. The toe ulcer did require some debridement but the other 2 areas actually appear to be doing quite well. 10/19/2020 upon evaluation today patient actually appears to be doing very well in regard to his wounds. In fact the heel does appear to be completely healed. The toe is doing better in the medial ankle on the left is also doing better. Overall I think he is headed in the right direction. 10/26/2020 upon evaluation today patient appears to be doing well with regard to his wound. He is showing signs of improvement which is great news and overall I am very pleased with where things stand  today. No fevers, chills, nausea, vomiting, or diarrhea. 11/02/2020 upon evaluation today patient appears to be doing well with regard to his wounds. He has been tolerating the dressing changes without complication overall I am extremely pleased with where things stand today. He in regard to the toe is almost completely healed and the medial ankle on the left is doing much better. 11/09/2020 upon evaluation today patient appears to be doing a little poorly in regard to his left medial ankle ulcer. Fortunately there does not  appear to be any signs of systemic infection but unfortunately locally he does appear to be infected in fact he has blue-green drainage consistent with Pseudomonas. 11/16/2020 upon evaluation today patient appears to be doing well with regard to his wound. It actually appears to be doing better. I did place him on gentamicin cream since the Cipro was actually resistant even though he was positive for Pseudomonas on culture. Overall I think that he does seem to be doing better though I am unsure whether or not they have actually been putting the cream on. The patient is not sure that we did talk to the nurse directly and she was going to initiate that treatment. Fortunately there does not appear to be any signs of active infection at this time. No fevers, chills, nausea, vomiting, or diarrhea. 4/28; the area on the right second toe is close to healed. Left medial ankle required debridement 12/07/2020 upon evaluation today patient appears to be doing well with regard to his wounds. In fact the right second toe appears to be completely healed which is great news. Fortunately there does not appear to be any signs of active infection at this time which is also great news. I think we can probably discontinue the gentamicin on top of everything else. 12/14/2020 upon evaluation today patient appears to be doing well with regard to his wound. He is making good progress and overall very pleased with where things stand today. There is no signs of active infection at this time which is great news. 12/28/2020 upon evaluation today patient appears to be doing well with regard to his wounds. He has been tolerating the dressing changes without complication. Fortunately there is no signs of active infection at this time. No fevers, chills, nausea, vomiting, or diarrhea. 12/28/2020 upon evaluation today patient's wound bed actually showed signs of excellent improvement. He has great epithelization and granulation I do not  see any signs of infection overall I am extremely pleased with where things stand at this point. No fevers, chills, nausea, vomiting, or diarrhea. 01/11/2021 upon evaluation today patient appears to be doing well with regard to his wound on his leg. He has been tolerating the dressing changes without complication. Fortunately there does not appear to be any signs of active infection which is great news. No fevers, chills, nausea, vomiting, or diarrhea. 01/25/2021 upon evaluation today patient appears to be doing well with regard to his wound. He has been tolerating the dressing changes without complication. Fortunately the collagen seems to be doing a great job which is excellent news. No fevers, chills, nausea, vomiting, or diarrhea. 02/08/2021 upon evaluation today patient's wound is actually looking a little bit worse especially in the periwound compared to previous. Fortunately there does not appear to be any signs of infection which is great news with that being said he does have some irritation around the periphery of the wound which has me more concerned. He actually had a dressing on that had not been changed  in 3 days. He also is supposed to have daily dressing changes. With regard to the dressing applied he had a silver alginate dressing and silver collagen is what is recommended and ordered. He also had no Desitin around the edges of the wound in the periwound region although that is on the order inspect to be done as well. In general I was very concerned I did contact [facility] actually spoke with Magda Paganini who is the scheduling individual and subsequently she stated that she would pass the information to the D ON apparently the D ON was not available to talk to me when I call today. 02/18/2021 upon evaluation today patient's wound is actually showing signs of improvement. Fortunately there does not appear to be any evidence of infection which is great news overall I am extremely pleased  with where things stand today. No fevers, chills, nausea, vomiting, or diarrhea. 8/3; patient presents for 1 week follow-up. He has no issues or complaints today. He denies signs of infection. 03/11/2021 upon evaluation today patient appears to be doing well with regard to his wound. He does have a little bit of slough noted on the surface of the wound but fortunately there does not appear to be any signs of active infection at this time. No fevers, chills, nausea, vomiting, or diarrhea. 03/18/2021 upon evaluation today patient appears to be doing well with regard to his wound. He has been tolerating the dressing changes without complication. There was a little irritation more proximal to where the wound was that was not noted last week but nonetheless this is very superficial just seems to be more irritation we just need to make sure to put a good amount of the zinc over the area in my opinion. Otherwise he does not seem to be doing significantly worse at all which is great news. 03/25/2021 upon evaluation today patient appears to be doing well with regard to his wound. He is going require some sharp debridement today to clear with some of the necrotic debris. I did perform this today without complication postdebridement wound bed appears to be doing much better this is great news. 04/08/2021 upon evaluation today patient appears to be doing decently well in regard to his wound although the overall measurement is not significantly smaller compared to previous. It is gone down a little bit but still the facility continues to not really put the appropriate dressings in place in fact he was supposed to have collagen we think he probably had more of an allergy to At this point. Fortunately there does not appear to be any signs of active infection systemically though locally I do not see anything on initial visualization either as far as erythema or warmth. 04/15/2021 upon evaluation today patient appears to  be doing well with regard to his wound. He is actually showing signs of improvement. I did place him on antibiotics last week, Cipro. He has been taking that 2 times a day and seems to be tolerating it very well. I do not see any evidence of worsening and in fact the overall appearance of the wound is smaller today which is also great news. 9/26; left medial ankle chronic venous insufficiency wound is improved. Using Hydrofera Blue 10/10; left medial ankle chronic venous insufficiency. Wound has not changed much in appearance completely nonviable surface. Apparently there have been problems getting the right product on the wound at the facility although he came in with [facility] on today 05/14/2021 upon evaluation today patient appears  to be doing well with regard to his wound. I think he is making progress here which is good news. Fortunately there does not appear to be any signs of active infection at this time. No fevers, chills, nausea, vomiting, or diarrhea. 05/20/2021 upon evaluation today patient appears to be doing well with regard to his wound. He is showing signs of good improvement which is great news. There does not appear to be any evidence of active infection which is also excellent news. No fevers, chills, nausea, vomiting, or diarrhea. 05/28/2021 upon evaluation today patient appears to be doing quite well. There does not appear to be any signs of active infection at this time which is great news. Overall I am extremely pleased with where things stand today. I think he is headed in the right direction. 06/11/2021 upon evaluation today patient appears to be doing well with regard to his left ankle ulcer and poorly in regard to the toe ulcer on the second toe right Chad [name], Chad [name] ([id number]) [id number] Page 3 of 13 foot. This appears to show signs of joint exposure. Apparently this has been present for 1 to 2 months although he kept forgetting to tell me  about it. That is unfortunate as right now it definitely appears to be doing significantly worse than what I would like to see. There does not appear to be any signs of active infection systemically though locally I am concerned about the possibility of infection the toe is quite red. Again no one from the facility ever contacted [place] to advise that this was going on in the interim either. 06/17/2021 upon evaluation today patient presents for follow-up I did review his x-ray which showed a navicular bone fracture I am unsure of the chronicity of this. Subsequently he also had osteomyelitis of the toe which was what I was more concerned about this did not show up on x-ray but did show up on the pathology scrapings. This was listed as acute osteomyelitis. Nonetheless at this point I think that the antibiotic treatment is the best regimen to go with currently. The patient is in agreement with that plan. Nonetheless he has initially 30 days of doxycycline off likely extend that towards the end of the treatment cycle that will be around the middle of December for an additional 2 weeks. That all depends on how well he continues to heal. Nonetheless based on what I am seeing in the foot I did want a proceed with an MRI as well which I think will be helpful to identify if there is anything else that needs to be addressed from the standpoint of infection. 06/24/2021 upon evaluation today patient appears to be doing pretty well in regards to his wounds. I think both are actually showing signs of improvement which is good I did review his MRI today which did show signs of osteomyelitis of the middle and proximal phalanx on his right foot of the affected toe. With that being said this is actually showing signs of significant improvement today already with the antibiotic therapy I think the redness is also improved. Overall I think that we just need to give this some time with appropriate wound care we will see how  things go potentially hyperbarics could be considered. 07/02/2021 upon inspection today patient actually appears to be doing well in regard to his left ankle which is getting very close to complete resolution of pleased in that regard. Unfortunately he is continuing to have issues with his second toe right foot and  this seems to still be very painful for him. Recommend he try something different from the standpoint of antibiotics. 07/15/2021 upon evaluation today patient appears to be doing actually pretty well in regard to his foot. This is actually showing signs of significant improvement which is great news. Overall I feel like the patient is improving both in regard to the second toe as well as the ankle on the left. With that being said the biggest issue that I do see currently is that he is needing to have a refill of the doxycycline that we previously treated him with. He also did see podiatry they are not going to recommend any amputation at this point since he seems to be doing quite well. For that reason we just need to keep things under control from an infection standpoint. 08/01/2021 upon evaluation today patient appears to be doing well with regard to his wound. He has been tolerating the dressing changes without complication. Fortunately there does not appear to be any evidence of active infection locally nor systemically at this point. In fact I think everything is doing excellent in fact his second toe on the right foot is almost healed and the ankle on the left ankle region is actually very close to being healed as well. 08/08/2021 upon evaluation today patient appears to be doing well with regard to his wound. He has been tolerating the dressing changes without complication. Fortunately I do not see any signs of active infection at this time. Readmission: 12-06-2021 upon evaluation today patient presents for reevaluation here in the clinic he does tell me that he was being seen in  facility at [facility] by a provider that was coming in. He is not sure who this was. He tells me however that the wound seems to have gotten worse even compared to where it was when we last saw him at this point. With that being said I do believe that he is likely going need ongoing wound care here in the clinic and I do believe that we need to be the ones to frontline this since his wound does seem to be getting worse not better at this point. He voiced understanding. He is also in agreement with this plan and feels more comfortable coming here she tells me. Patient's medical history really has not changed since his prior admission he was only gone since January. 12-27-2021 upon evaluation today patient appears to be doing well with regard to his wound they did run out of the [facility] so they did not put anything on just an ABD pad with gentamicin. Still we are seeing some signs of good improvement here with some new epithelization which is great news. 01-10-2022 upon evaluation today patient appears to be doing well with regard to his wounds and he is going require some sharp debridement but overall seems to be making good progress. Fortunately I do not see any evidence of active infection locally or systemically at this time which is great news. 01-24-2022 upon evaluation today patient appears to be doing well with regard to his wound. The facility actually came and dropped him off early and he had another appointment at the hospital and then they just brought him over here and this was still hours before his appointment this afternoon. For that reason we did do our best to work him in this morning and fortunately had some space to make this happen. With that being said patient's wound does seem to be making progress here  and I am very pleased in that regard I do not see any signs of active infection locally or systemically at this time. 02-07-2022 patient appears to be doing well  currently in regard to his wounds. In fact one of them the more proximal is healed the distal is still open but seems to be doing excellent. Fortunately I do not see any evidence of active infection locally or systemically at this time which is great news. No fevers, chills, nausea, vomiting, or diarrhea. 7/27; left medial ankle venous. Improving per our intake nurse. We are using Hydrofera Blue under Tubigrip compression. They are changing that at his facility 03-06-2022 upon evaluation today patient appears to be doing well with regard to the his wound he is can require some sharp debridement but seems to be making excellent progress. Fortunately I do not see any evidence of active infection locally or systemically which is great news. 03-27-2022 upon evaluation today patient's wound is actually showing signs of excellent improvement. Fortunately I see no signs of active infection locally or systemically at this time which is great news. No fevers, chills, nausea, vomiting, or diarrhea. 04-21-2022 upon evaluation today patient appears to be doing excellent in regard to his wound in fact this is very close to resolution based on what I am seeing. I do not see any evidence of active infection locally or systemically at this time which is great news and overall I am extremely pleased with where we are today. 05-05-2022 upon evaluation today patient's wound actually appears to potentially be completely healed. Fortunately I do not see any evidence of active infection at this time which is great news and overall I am very pleased I think this needs a little time to toughen up but other than that I really do believe were doing quite well. He is very pleased to hear this its been a long time coming. 05-19-2022 upon evaluation today patient appears to be doing well currently in regard to his wound. He in fact we were hoping will be completely healed and closed but it still has a very tiny area right in the  center which is still continuing to drain. Fortunately I do not see any signs of infection locally or systemically which is great news. 11/6; this wound is close to completely" he comes in this week with some erythema at 1 edge of this which looks like something was rubbing on the area. Other than that no evidence of infection 06-16-2022 upon evaluation today patient appears to be doing well currently in regard to his ankle ulcer. This is very close to being healed but still has a small area in the central portion which is not. Fortunately there does not appear to be any signs of infection locally or systemically which is great news. No fevers, chills, nausea, vomiting, or diarrhea. 06-30-2022 upon evaluation patient's wound pretty much appears to be almost completely closed. In fact it may even be closed but there are still a lot of skin irritation around the edges of the wound and I just do not feel great about releasing them yet I would like to continue to monitor this just a little bit longer before getting to that point. He is not opposed to this and in fact is happy to continue to come in if need be in order to make sure that things are moving in the right direction he definitely does not want to backtrack. Chad [name], Chad [name] ([id number]) [id number] Page 4 of 13 07-14-2022 upon evaluation  today patient appears to be doing well currently in regard to his wound. Has been tolerating the dressing changes without complication. Fortunately I see no evidence of active infection locally nor systemically which is great news. No fevers, chills, nausea, vomiting, or diarrhea. 08-11-2022 upon evaluation today patient appears to be doing a little worse than last time I saw him although it has been a month. Unfortunately he was not able to be seen due to the fact that he was quarantined in the facility with COVID. Subsequently he tells me that he mention to the facility staff several  times about taking it off over the past several weeks but they continue to tell him that someone have to get in touch with [place] here although nobody ever did until Friday when the nurse manager called to have that documented in the communication notes. With that being said unfortunately this means that he had a wrap on that he was supposed to have on for only 1 week for ended up being on four 1 month essentially. I am glad that there is nothing worse than what we see currently to be perfectly honest. 08-19-2022 upon evaluation today patient appears to be doing well currently in regard to his wounds. He has been tolerating the dressing changes without complication. This is actually smaller today which is great news after last week's reopening. Fortunately I do not see any evidence of infection locally nor systemically at this point. 08-26-2022 upon evaluation today patient appears to be doing well currently in regard to his wound. He has been tolerating the dressing changes without complication. Fortunately there does not appear to be any signs of active infection locally nor systemically which is great news. No fevers, chills, nausea, vomiting, or diarrhea. 09-02-2022 upon evaluation today patient's wound actually showing signs of excellent improvement this is measuring smaller and looking much better. I am very pleased with where we stand I do believe that we are headed in the right direction. 09-09-2022 upon evaluation today patient appears to be doing decently well in regard to his leg in general although there was some swelling this is the 1 thing that I am not too happy with based on what I see currently. Fortunately there does not appear to be any signs of infection locally nor systemically at this point. 09-16-2022 upon evaluation today patient appears to be doing well currently in regard to his wound. He is actually showing signs of improvement he wore the Tubigrip this week and it significantly  better compared to last week's evaluation. Fortunately I do not see any evidence of active infection locally or systemically which is great news. Overall I think that were headed in the right direction which is great news. In fact overall I think this is the best that he has looked in some time. He does not like the Tubigrip but actually did extremely well with this in my opinion. I think he needs to continue to utilize this. 09-23-2022 upon evaluation today patient appears to be doing well currently in regard to his wound. He has been tolerating the dressing changes without complication. Fortunately there does not appear to be any signs of active infection locally nor systemically which is great news and in general I do feel like that we are headed in the right direction. He has been having trouble with the Tubigrip however we have gotten the facility to order compression socks he has not gotten them as of yet. 3/4; patient is about the same with  a wound on the left posterior leg. This is a venous wound. He needs more compression on this leg but he took off the previous wraps we are using. I am not certain whether he is using juxta lite stockings at the nursing home. 3/12; patient presents for follow-up. He unfortunately has 2 new wounds 1 to the dorsal left side and the other to the right heel. He is not sure how these happened. The right heel appears to be caused by pressure. He has been using Hydrofera Blue to the original wound on the left leg. 10-14-2022 upon evaluation today patient appears to be doing well currently in regard to his original wound but unfortunately he has several new wounds which I was completely unaware of before walking into the room to see him today. Unfortunately he seems to not be doing nearly as well as what I saw when I last had an appointment with him 3 weeks ago. Fortunately I do not see any signs of systemic infection though unfortunately locally he has definite  infection of the left lower extremity. He is currently on doxycycline I am going to likely have to extend that today. 10-21-2022 upon evaluation today patient appears to be doing well currently in regard to his wounds. He seems to be tolerating the dressing changes without complication. Fortunately there does not appear to be any signs of active infection locally or systemically which is great news and in general I definitely feel like we are on the right track here. Electronic Signature(s) Signed: 10/21/2022 9:39:39 AM By: Worthy Keeler PA-C Entered By: Worthy Keeler on 10/21/2022 09:39:38 -------------------------------------------------------------------------------- Physical Exam Details Patient Name: Date of Service: Chad [name], Chad [name] 10/21/2022 8:30 A M Medical Record Number: [id number] Patient Account Number: [id number] Date of Birth/Sex: Treating RN: 12/01/1960 (62 y.o. Seward Meth Primary Care Provider: [name] Other Clinician: Referring Provider: Treating Provider/Extender: [name] in Treatment: 37 Constitutional Well-nourished and well-hydrated in no acute distress. Respiratory normal breathing without difficulty. Psychiatric this patient is able to make decisions and demonstrates good insight into disease process. Alert and Oriented x 3. pleasant and cooperative. Notes Upon inspection the patient does seem to be doing better than last week although to be honest he is still having quite a bit of an issue here with the left anterior lower extremity where he does have a fairly significant abscess area that is open the good news is though I feel like that since his open is feeling better than it was previous. Electronic Signature(s) TRUE, COWDIN ([id number]) [id number] Page 5 of 13 Signed: 10/21/2022 9:40:41 AM By: Worthy Keeler PA-C Entered By: Worthy Keeler on 10/21/2022  09:40:41 -------------------------------------------------------------------------------- Physician Orders Details Patient Name: Date of Service: Chad [name], Chad [name] 10/21/2022 8:30 A M Medical Record Number: [id number] Patient Account Number: [id number] Date of Birth/Sex: Treating RN: [birth year] (62 y.o. 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 Bathing/ Shower/ Hygiene May shower with wound dressing protected with  water repellent cover or cast protector. No tub bath. Anesthetic (Use 'Patient Medications' Section for Anesthetic Order Entry) Lidocaine applied to wound bed Edema Control - Lymphedema / Segmental Compressive Device / Other A wraps ce Patient to wear own compression stockings. Remove compression stockings every night before going to bed and put on every morning when getting up. - please order patient compression stockings 8-73mm/hg ankle 23cm, calf 32cm, leg length floor to knee 40cm Elevate legs to the level of the heart and pump ankles as often as possible Elevate leg(s) parallel to the floor when sitting. Off-Loading Heel suspension boot - when in bed Medications-Please add to medication list. Other: - apply AandD ointment to leg, do not apply to wound Wound Treatment Wound #12 - Ankle Wound Laterality: Left, Medial,  Distal Cleanser: Soap and Water Every Other Day/30 Days Discharge Instructions: Gently cleanse wound with antibacterial soap, rinse and pat dry prior to dressing wounds Cleanser: Wound Cleanser Every Other Day/30 Days Discharge Instructions: Wash your hands with soap and water. Remove old dressing, discard into plastic bag and place into trash. Cleanse the wound with Wound Cleanser prior to applying a clean dressing using gauze sponges, not tissues or cotton balls. Do not scrub or use excessive force. Pat dry using gauze sponges, not tissue or cotton balls. Peri-Wound Care: AandD Ointment Every Other Day/30 Days Discharge Instructions: Apply AandD Ointment to areas of redness Prim Dressing: Hydrofera Blue Ready Transfer Foam, 4x5 (in/in) Every Other Day/30 Days ary Discharge Instructions: Apply Hydrofera Blue Ready to wound bed as directed Secondary Dressing: ABD Pad 5x9 (in/in) Every Other Day/30 Days Discharge Instructions: Cover with ABD pad Secured With: Kerlix Roll Sterile or Non-Sterile 6-ply 4.5x4 (yd/yd) Every Other Day/30 Days Discharge Instructions: Apply Kerlix as directed Wound #15 - Foot Wound Laterality: Dorsal, Left Cleanser: Soap and Water Every Other Day/30 Days Discharge Instructions: Gently cleanse wound with antibacterial soap, rinse and pat dry prior to dressing wounds [name] ([id number]) [id number] Page 6 of 13 Cleanser: Wound Cleanser Every Other Day/30 Days Discharge Instructions: Wash your hands with soap and water. Remove old dressing, discard into plastic bag and place into trash. Cleanse the wound with Wound Cleanser prior to applying a clean dressing using gauze sponges, not tissues or cotton balls. Do not scrub or use excessive force. Pat dry using gauze sponges, not tissue or cotton balls. Peri-Wound Care: AandD Ointment Every Other Day/30 Days Discharge Instructions: Apply AandD Ointment to areas of redness Topical:  Santyl Collagenase Ointment, 30 (gm), tube Every Other Day/30 Days Discharge Instructions: apply nickel thick to wound bed only Prim Dressing: Gauze Every Other Day/30 Days ary Discharge Instructions: As directed: dry, moistened with saline or moistened with Dakins Solution Secondary Dressing: ABD Pad 5x9 (in/in) Every Other Day/30 Days Discharge Instructions: Cover with ABD pad Secured With: Kerlix Roll Sterile or Non-Sterile 6-ply 4.5x4 (yd/yd) Every Other Day/30 Days Discharge Instructions: Apply Kerlix as directed Wound #16 - Calcaneus Wound Laterality: Right Cleanser: Soap and Water Every Other Day/30 Days Discharge Instructions: Gently cleanse wound with antibacterial soap, rinse and pat dry prior to dressing wounds Cleanser: Wound Cleanser Every Other Day/30 Days Discharge Instructions: Wash your hands with soap and water. Remove old dressing, discard into plastic bag and place into trash. Cleanse the wound with Wound Cleanser prior to applying a clean dressing using gauze sponges, not tissues or cotton balls. Do not scrub or use excessive force. Pat dry using gauze sponges, not tissue or cotton balls. Peri-Wound Care: AandD Ointment  Every Other Day/30 Days Discharge Instructions: Apply AandD Ointment to areas of redness Topical: Santyl Collagenase Ointment, 30 (gm), tube Every Other Day/30 Days Discharge Instructions: apply nickel thick to wound bed only Prim Dressing: Gauze Every Other Day/30 Days ary Discharge Instructions: As directed: dry, moistened with saline or moistened with Dakins Solution Secondary Dressing: ABD Pad 5x9 (in/in) Every Other Day/30 Days Discharge Instructions: Cover with ABD pad Secured With: Kerlix Roll Sterile or Non-Sterile 6-ply 4.5x4 (yd/yd) Every Other Day/30 Days Discharge Instructions: Apply Kerlix as directed Wound #17 - Lower Leg Wound Laterality: Left, Anterior Cleanser: Soap and Water Every Other Day/30 Days Discharge Instructions: Gently  cleanse wound with antibacterial soap, rinse and pat dry prior to dressing wounds Cleanser: Wound Cleanser Every Other Day/30 Days Discharge Instructions: Wash your hands with soap and water. Remove old dressing, discard into plastic bag and place into trash. Cleanse the wound with Wound Cleanser prior to applying a clean dressing using gauze sponges, not tissues or cotton balls. Do not scrub or use excessive force. Pat dry using gauze sponges, not tissue or cotton balls. Peri-Wound Care: AandD Ointment Every Other Day/30 Days Discharge Instructions: Apply AandD Ointment to areas of redness Topical: Santyl Collagenase Ointment, 30 (gm), tube Every Other Day/30 Days Discharge Instructions: apply nickel thick to wound bed only Secondary Dressing: ABD Pad 5x9 (in/in) Every Other Day/30 Days Discharge Instructions: Cover with ABD pad Secured With: Kerlix Roll Sterile or Non-Sterile 6-ply 4.5x4 (yd/yd) Every Other Day/30 Days Discharge Instructions: Apply Kerlix as directed Wound #18 - T Second oe Wound Laterality: Right, Medial Cleanser: Soap and Water Every Other Day/30 Days Discharge Instructions: Gently cleanse wound with antibacterial soap, rinse and pat dry prior to dressing wounds Cleanser: Wound Cleanser Every Other Day/30 Days Discharge Instructions: Wash your hands with soap and water. Remove old dressing, discard into plastic bag and place into trash. Cleanse the wound with Wound Cleanser prior to applying a clean dressing using gauze sponges, not tissues or cotton balls. Do not scrub or use excessive force. Pat dry using gauze sponges, not tissue or cotton balls. Peri-Wound Care: AandD Ointment Every Other Day/30 Days Discharge Instructions: Apply AandD Ointment to areas of redness Chad [name], Chad [name] ([id number]) [id number] Page 7 of 13 Topical: Santyl Collagenase Ointment, 30 (gm), tube Every Other Day/30 Days Discharge Instructions: apply nickel thick to  wound bed only Prim Dressing: Gauze Every Other Day/30 Days ary Discharge Instructions: As directed: dry, moistened with saline or moistened with Dakins Solution Secondary Dressing: ABD Pad 5x9 (in/in) Every Other Day/30 Days Discharge Instructions: Cover with ABD pad Secured With: Kerlix Roll Sterile or Non-Sterile 6-ply 4.5x4 (yd/yd) Every Other Day/30 Days Discharge Instructions: Apply Kerlix as directed Electronic Signature(s) Signed: 10/21/2022 11:46:48 AM By: Rosalio Loud MSN RN CNS WTA Signed: 10/21/2022 6:13:56 PM By: Worthy Keeler PA-C Entered By: Rosalio Loud on 10/21/2022 09:29:05 -------------------------------------------------------------------------------- Problem List Details Patient Name: Date of Service: Chad [name] 10/21/2022 8:30 A M Medical Record Number: [id number] Patient Account Number: [id number] Date of Birth/Sex: Treating RN: 09-14-1960 (62 y.o. 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 affecting left non- 12/06/2021 No Yes dominant side 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, stage 3 10/07/2022 No Yes Inactive Problems Resolved Problems Electronic Signature(s) Signed: 10/21/2022 9:10:59 AM By: Worthy Keeler PA-C Entered By: Worthy Keeler on 10/21/2022 09:10:59 -------------------------------------------------------------------------------- Progress Note Details Patient Name: Date of  Service: Chad [name] 10/21/2022 8:30 A M Medical Record Number: [id number] Patient Account Number: [id number] Chad [name], Chad [name] ([id number]) [id number] Page 8 of 13 Date of Birth/Sex: Treating RN: 09-22-1960 (62 y.o. Seward Meth Primary Care Provider: Other Clinician: [name] Referring Provider: Treating Provider/Extender: [name] in Treatment: 75 Subjective Chief Complaint Information obtained from Patient Left ankle ulcer, Left dorsal foot wound, right heel History of Present Illness (HPI) 10/08/18 on evaluation today patient actually presents to our office for initial evaluation concerning wounds that he has of the bilateral lower extremities. 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 He also has bilateral venous stasis. He apparently has been homeless currently following discharge from the hospital apparently he has been placed at [facility] which is is a skilled nursing facility locally. Nonetheless fortunately he does not show any signs of infection at this time which is good news. In fact several of the wound actually appears to be showing some signs of improvement already in my pinion. There are a couple areas in the left leg in particular there likely gonna require some sharp debridement to help clear away some necrotic tissue and help with more sufficient healing. No fevers, chills, nausea, or vomiting noted at this time. 10/15/18 on evaluation today patient actually appears to be doing very well in regard to his bilateral lower extremities. He's been tolerating the dressing changes without complication. Fortunately there does not appear to be any evidence of active infection at this time which is great news. Overall I'm actually very pleased with how this  has progressed in just one visits time. Readmission: 08/14/2020 upon evaluation today patient presents for re-evaluation here in our clinic. He is having issues with his left ankle region as well as his right toe and his right heel. He tells me that the toe and heel actually began as a area that was itching that he was scratching and then subsequently opened up into wounds. These may have been abscess areas I presume based on what I am seeing currently. With regard to his left ankle region he tells me this was a similar type occurrence although he does have venous stasis this very well may be more of a venous leg ulcer more than anything. Nonetheless I do believe that the patient would benefit from appropriate and aggressive wound care to try to help get things under better control here. He does have history of a stroke on the left side affecting him to some degree there that he is able to stand although he does have some residual weakness. Otherwise again the patient does have chronic venous insufficiency as previously noted. His arterial studies most recently obtained showed that he had an ABI on the right of 1.16 with a TBI of 0.52 and on the left and ABI of 1.14 with a TBI of 0.81. That was obtained on 06/19/2020. 08/28/2020 upon evaluation today patient appears to be doing decently well in regard to his wounds in general. He has been  tolerating the dressing changes without complication. Fortunately there does not appear to be any signs of active infection which is great news. With that being said I think the [facility] is doing a good job I would recommend that we likely continue with that currently. 09/11/2020 upon evaluation today patient's wounds did not appear to be doing too poorly but again he is not really showing signs of significant improvement with regard to any of the wounds on the right. None of them have Hydrofera Blue on them I am not exactly sure why this is not being followed as  the facility did not contact us to let us know of any issues with obtaining dressings or otherwise. With that being said he is supposed to be using Hydrofera Blue on both of the wounds on the right foot as well as the ankle wound on the left side. 09/18/2020 upon evaluation today patient appears to be doing poorly with regard to his wounds. Again right now the left ankle in particular showed signs of extreme maceration. Apparently he was told by someone with staff at [place] they could not get the [facility]. With that being said this is something that is never been relayed to [place] one way or another. Also the patient subsequently has not supposed to have a border gauze dressing on. He should have an ABD pad and roll gauze to secure as this drains much too much just to have a border gauze dressing to cover. Nonetheless the fact that they are not using the appropriate dressing is directly causing deterioration of the left ankle wound it is significantly worse today compared to what it was previous. I did attempt to call [facility] while the patient was here I called three times and got no one to even pick up the phone. After this I had my for an office coordinator call and she was able to finally get through and leave a message with the D ON as of dictation of this note which is roughly about an hour and a half later I still have not been able to speak with anyone at the facility. 09/25/2020 upon evaluation today patient actually showing signs of good improvement which is excellent news. He has been tolerating the dressing changes without complication. Fortunately there is no signs of active infection which is great news. No fevers, chills, nausea, vomiting, or diarrhea. I do feel like the facility has been doing a much better job at taking care of him as far as the dressings are concerned. However the director of nursing never did call me back. 10/09/2020 upon evaluation today  patient appears to be doing well with regard to his wound. The toe ulcer did require some debridement but the other 2 areas actually appear to be doing quite well. 10/19/2020 upon evaluation today patient actually appears to be doing very well in regard to his wounds. In fact the heel does appear to be completely healed. The toe is doing better in the medial ankle on the left is also doing better. Overall I think he is headed in the right direction. 10/26/2020 upon evaluation today patient appears to be doing well with regard to his wound. He is showing signs of improvement which is great news and overall I am very pleased with where things stand today. No fevers, chills, nausea, vomiting, or diarrhea. 11/02/2020 upon evaluation today patient appears to be doing well with regard to his wounds. He has been tolerating the dressing changes without complication  overall I am extremely pleased with where things stand today. He in regard to the toe is almost completely healed and the medial ankle on the left is doing much better. 11/09/2020 upon evaluation today patient appears to be doing a little poorly in regard to his left medial ankle ulcer. Fortunately there does not appear to be any signs of systemic infection but unfortunately locally he does appear to be infected in fact he has blue-green drainage consistent with Pseudomonas. 11/16/2020 upon evaluation today patient appears to be doing well with regard to his wound. It actually appears to be doing better. I did place him on gentamicin cream since the Cipro was actually resistant even though he was positive for Pseudomonas on culture. Overall I think that he does seem to be doing better though I am unsure whether or not they have actually been putting the cream on. The patient is not sure that we did talk to the nurse directly and she was going to initiate that treatment. Fortunately there does not appear to be any signs of active infection at this time. No  fevers, chills, nausea, vomiting, or diarrhea. 4/28; the area on the right second toe is close to healed. Left medial ankle required debridement 12/07/2020 upon evaluation today patient appears to be doing well with regard to his wounds. In fact the right second toe appears to be completely healed which is great news. Fortunately there does not appear to be any signs of active infection at this time which is also great news. I think we can probably discontinue the gentamicin on top of everything else. 12/14/2020 upon evaluation today patient appears to be doing well with regard to his wound. He is making good progress and overall very pleased with where things stand today. There is no signs of active infection at this time which is great news. 12/28/2020 upon evaluation today patient appears to be doing well with regard to his wounds. He has been tolerating the dressing changes without complication. Fortunately there is no signs of active infection at this time. No fevers, chills, nausea, vomiting, or diarrhea. Chad [name], Chad [name] ([id number]) [id number] Page 9 of 13 12/28/2020 upon evaluation today patient's wound bed actually showed signs of excellent improvement. He has great epithelization and granulation I do not see any signs of infection overall I am extremely pleased with where things stand at this point. No fevers, chills, nausea, vomiting, or diarrhea. 01/11/2021 upon evaluation today patient appears to be doing well with regard to his wound on his leg. He has been tolerating the dressing changes without complication. Fortunately there does not appear to be any signs of active infection which is great news. No fevers, chills, nausea, vomiting, or diarrhea. 01/25/2021 upon evaluation today patient appears to be doing well with regard to his wound. He has been tolerating the dressing changes without complication. Fortunately the collagen seems to be doing a great job which is  excellent news. No fevers, chills, nausea, vomiting, or diarrhea. 02/08/2021 upon evaluation today patient's wound is actually looking a little bit worse especially in the periwound compared to previous. Fortunately there does not appear to be any signs of infection which is great news with that being said he does have some irritation around the periphery of the wound which has me more concerned. He actually had a dressing on that had not been changed in 3 days. He also is supposed to have daily dressing changes. With regard to the dressing applied he had a silver alginate dressing  and silver collagen is what is recommended and ordered. He also had no Desitin around the edges of the wound in the periwound region although that is on the order inspect to be done as well. In general I was very concerned I did contact [facility] actually spoke with Magda Paganini who is the scheduling individual and subsequently she stated that she would pass the information to the D ON apparently the D ON was not available to talk to me when I call today. 02/18/2021 upon evaluation today patient's wound is actually showing signs of improvement. Fortunately there does not appear to be any evidence of infection which is great news overall I am extremely pleased with where things stand today. No fevers, chills, nausea, vomiting, or diarrhea. 8/3; patient presents for 1 week follow-up. He has no issues or complaints today. He denies signs of infection. 03/11/2021 upon evaluation today patient appears to be doing well with regard to his wound. He does have a little bit of slough noted on the surface of the wound but fortunately there does not appear to be any signs of active infection at this time. No fevers, chills, nausea, vomiting, or diarrhea. 03/18/2021 upon evaluation today patient appears to be doing well with regard to his wound. He has been tolerating the dressing changes without complication. There was a little  irritation more proximal to where the wound was that was not noted last week but nonetheless this is very superficial just seems to be more irritation we just need to make sure to put a good amount of the zinc over the area in my opinion. Otherwise he does not seem to be doing significantly worse at all which is great news. 03/25/2021 upon evaluation today patient appears to be doing well with regard to his wound. He is going require some sharp debridement today to clear with some of the necrotic debris. I did perform this today without complication postdebridement wound bed appears to be doing much better this is great news. 04/08/2021 upon evaluation today patient appears to be doing decently well in regard to his wound although the overall measurement is not significantly smaller compared to previous. It is gone down a little bit but still the facility continues to not really put the appropriate dressings in place in fact he was supposed to have collagen we think he probably had more of an allergy to At this point. Fortunately there does not appear to be any signs of active infection systemically though locally I do not see anything on initial visualization either as far as erythema or warmth. 04/15/2021 upon evaluation today patient appears to be doing well with regard to his wound. He is actually showing signs of improvement. I did place him on antibiotics last week, Cipro. He has been taking that 2 times a day and seems to be tolerating it very well. I do not see any evidence of worsening and in fact the overall appearance of the wound is smaller today which is also great news. 9/26; left medial ankle chronic venous insufficiency wound is improved. Using Hydrofera Blue 10/10; left medial ankle chronic venous insufficiency. Wound has not changed much in appearance completely nonviable surface. Apparently there have been problems getting the right product on the wound at the facility although he came  in with [facility] on today 05/14/2021 upon evaluation today patient appears to be doing well with regard to his wound. I think he is making progress here which is good news. Fortunately there does not  appear to be any signs of active infection at this time. No fevers, chills, nausea, vomiting, or diarrhea. 05/20/2021 upon evaluation today patient appears to be doing well with regard to his wound. He is showing signs of good improvement which is great news. There does not appear to be any evidence of active infection which is also excellent news. No fevers, chills, nausea, vomiting, or diarrhea. 05/28/2021 upon evaluation today patient appears to be doing quite well. There does not appear to be any signs of active infection at this time which is great news. Overall I am extremely pleased with where things stand today. I think he is headed in the right direction. 06/11/2021 upon evaluation today patient appears to be doing well with regard to his left ankle ulcer and poorly in regard to the toe ulcer on the second toe right foot. This appears to show signs of joint exposure. Apparently this has been present for 1 to 2 months although he kept forgetting to tell me about it. That is unfortunate as right now it definitely appears to be doing significantly worse than what I would like to see. There does not appear to be any signs of active infection systemically though locally I am concerned about the possibility of infection the toe is quite red. Again no one from the facility ever contacted [place] to advise that this was going on in the interim either. 06/17/2021 upon evaluation today patient presents for follow-up I did review his x-ray which showed a navicular bone fracture I am unsure of the chronicity of this. Subsequently he also had osteomyelitis of the toe which was what I was more concerned about this did not show up on x-ray but did show up on the pathology scrapings. This was listed as acute  osteomyelitis. Nonetheless at this point I think that the antibiotic treatment is the best regimen to go with currently. The patient is in agreement with that plan. Nonetheless he has initially 30 days of doxycycline off likely extend that towards the end of the treatment cycle that will be around the middle of December for an additional 2 weeks. That all depends on how well he continues to heal. Nonetheless based on what I am seeing in the foot I did want a proceed with an MRI as well which I think will be helpful to identify if there is anything else that needs to be addressed from the standpoint of infection. 06/24/2021 upon evaluation today patient appears to be doing pretty well in regards to his wounds. I think both are actually showing signs of improvement which is good I did review his MRI today which did show signs of osteomyelitis of the middle and proximal phalanx on his right foot of the affected toe. With that being said this is actually showing signs of significant improvement today already with the antibiotic therapy I think the redness is also improved. Overall I think that we just need to give this some time with appropriate wound care we will see how things go potentially hyperbarics could be considered. 07/02/2021 upon inspection today patient actually appears to be doing well in regard to his left ankle which is getting very close to complete resolution of pleased in that regard. Unfortunately he is continuing to have issues with his second toe right foot and this seems to still be very painful for him. Recommend he try something different from the standpoint of antibiotics. 07/15/2021 upon evaluation today patient appears to be doing actually pretty well in regard  to his foot. This is actually showing signs of significant improvement which is great news. Overall I feel like the patient is improving both in regard to the second toe as well as the ankle on the left. With that  being said the biggest issue that I do see currently is that he is needing to have a refill of the doxycycline that we previously treated him with. He also did see podiatry they are not going to recommend any amputation at this point since he seems to be doing quite well. For that reason we just need to keep things under control from an infection standpoint. 08/01/2021 upon evaluation today patient appears to be doing well with regard to his wound. He has been tolerating the dressing changes without complication. Fortunately there does not appear to be any evidence of active infection locally nor systemically at this point. In fact I think everything is doing excellent in fact his second toe on the right foot is almost healed and the ankle on the left ankle region is actually very close to being healed as well. 08/08/2021 upon evaluation today patient appears to be doing well with regard to his wound. He has been tolerating the dressing changes without complication. Fortunately I do not see any signs of active infection at this time. Chad [name], Chad [name] ([id number]) [id number] Page 10 of 13 Readmission: 12-06-2021 upon evaluation today patient presents for reevaluation here in the clinic he does tell me that he was being seen in facility at [facility] by a provider that was coming in. He is not sure who this was. He tells me however that the wound seems to have gotten worse even compared to where it was when we last saw him at this point. With that being said I do believe that he is likely going need ongoing wound care here in the clinic and I do believe that we need to be the ones to frontline this since his wound does seem to be getting worse not better at this point. He voiced understanding. He is also in agreement with this plan and feels more comfortable coming here she tells me. Patient's medical history really has not changed since his prior admission he was only  gone since January. 12-27-2021 upon evaluation today patient appears to be doing well with regard to his wound they did run out of the [facility] so they did not put anything on just an ABD pad with gentamicin. Still we are seeing some signs of good improvement here with some new epithelization which is great news. 01-10-2022 upon evaluation today patient appears to be doing well with regard to his wounds and he is going require some sharp debridement but overall seems to be making good progress. Fortunately I do not see any evidence of active infection locally or systemically at this time which is great news. 01-24-2022 upon evaluation today patient appears to be doing well with regard to his wound. The facility actually came and dropped him off early and he had another appointment at the hospital and then they just brought him over here and this was still hours before his appointment this afternoon. For that reason we did do our best to work him in this morning and fortunately had some space to make this happen. With that being said patient's wound does seem to be making progress here and I am very pleased in that regard I do not see any signs of active infection locally or systemically at this time. 02-07-2022  patient appears to be doing well currently in regard to his wounds. In fact one of them the more proximal is healed the distal is still open but seems to be doing excellent. Fortunately I do not see any evidence of active infection locally or systemically at this time which is great news. No fevers, chills, nausea, vomiting, or diarrhea. 7/27; left medial ankle venous. Improving per our intake nurse. We are using Hydrofera Blue under Tubigrip compression. They are changing that at his facility 03-06-2022 upon evaluation today patient appears to be doing well with regard to the his wound he is can require some sharp debridement but seems to be making excellent progress. Fortunately I do not see  any evidence of active infection locally or systemically which is great news. 03-27-2022 upon evaluation today patient's wound is actually showing signs of excellent improvement. Fortunately I see no signs of active infection locally or systemically at this time which is great news. No fevers, chills, nausea, vomiting, or diarrhea. 04-21-2022 upon evaluation today patient appears to be doing excellent in regard to his wound in fact this is very close to resolution based on what I am seeing. I do not see any evidence of active infection locally or systemically at this time which is great news and overall I am extremely pleased with where we are today. 05-05-2022 upon evaluation today patient's wound actually appears to potentially be completely healed. Fortunately I do not see any evidence of active infection at this time which is great news and overall I am very pleased I think this needs a little time to toughen up but other than that I really do believe were doing quite well. He is very pleased to hear this its been a long time coming. 05-19-2022 upon evaluation today patient appears to be doing well currently in regard to his wound. He in fact we were hoping will be completely healed and closed but it still has a very tiny area right in the center which is still continuing to drain. Fortunately I do not see any signs of infection locally or systemically which is great news. 11/6; this wound is close to completely" he comes in this week with some erythema at 1 edge of this which looks like something was rubbing on the area. Other than that no evidence of infection 06-16-2022 upon evaluation today patient appears to be doing well currently in regard to his ankle ulcer. This is very close to being healed but still has a small area in the central portion which is not. Fortunately there does not appear to be any signs of infection locally or systemically which is great news. No fevers, chills, nausea,  vomiting, or diarrhea. 06-30-2022 upon evaluation patient's wound pretty much appears to be almost completely closed. In fact it may even be closed but there are still a lot of skin irritation around the edges of the wound and I just do not feel great about releasing them yet I would like to continue to monitor this just a little bit longer before getting to that point. He is not opposed to this and in fact is happy to continue to come in if need be in order to make sure that things are moving in the right direction he definitely does not want to backtrack. 07-14-2022 upon evaluation today patient appears to be doing well currently in regard to his wound. Has been tolerating the dressing changes without complication. Fortunately I see no evidence of active infection locally nor systemically  which is great news. No fevers, chills, nausea, vomiting, or diarrhea. 08-11-2022 upon evaluation today patient appears to be doing a little worse than last time I saw him although it has been a month. Unfortunately he was not able to be seen due to the fact that he was quarantined in the facility with COVID. Subsequently he tells me that he mention to the facility staff several times about taking it off over the past several weeks but they continue to tell him that someone have to get in touch with [place] here although nobody ever did until Friday when the nurse manager called to have that documented in the communication notes. With that being said unfortunately this means that he had a wrap on that he was supposed to have on for only 1 week for ended up being on four 1 month essentially. I am glad that there is nothing worse than what we see currently to be perfectly honest. 08-19-2022 upon evaluation today patient appears to be doing well currently in regard to his wounds. He has been tolerating the dressing changes without complication. This is actually smaller today which is great news after last week's reopening.  Fortunately I do not see any evidence of infection locally nor systemically at this point. 08-26-2022 upon evaluation today patient appears to be doing well currently in regard to his wound. He has been tolerating the dressing changes without complication. Fortunately there does not appear to be any signs of active infection locally nor systemically which is great news. No fevers, chills, nausea, vomiting, or diarrhea. 09-02-2022 upon evaluation today patient's wound actually showing signs of excellent improvement this is measuring smaller and looking much better. I am very pleased with where we stand I do believe that we are headed in the right direction. 09-09-2022 upon evaluation today patient appears to be doing decently well in regard to his leg in general although there was some swelling this is the 1 thing that I am not too happy with based on what I see currently. Fortunately there does not appear to be any signs of infection locally nor systemically at this point. 09-16-2022 upon evaluation today patient appears to be doing well currently in regard to his wound. He is actually showing signs of improvement he wore the Tubigrip this week and it significantly better compared to last week's evaluation. Fortunately I do not see any evidence of active infection locally or systemically which is great news. Overall I think that were headed in the right direction which is great news. In fact overall I think this is the best that he has looked in some time. He does not like the Tubigrip but actually did extremely well with this in my opinion. I think he needs to continue to utilize this. 09-23-2022 upon evaluation today patient appears to be doing well currently in regard to his wound. He has been tolerating the dressing changes without complication. Fortunately there does not appear to be any signs of active infection locally nor systemically which is great news and in general I do feel like that we are  headed in the right direction. He has been having trouble with the Tubigrip however we have gotten the facility to order compression socks he has not gotten them as of yet. Chad [name], Chad [name] ([id number]) [id number] Page 11 of 13 3/4; patient is about the same with a wound on the left posterior leg. This is a venous wound. He needs more compression on this leg but he took off the  previous wraps we are using. I am not certain whether he is using juxta lite stockings at the nursing home. 3/12; patient presents for follow-up. He unfortunately has 2 new wounds 1 to the dorsal left side and the other to the right heel. He is not sure how these happened. The right heel appears to be caused by pressure. He has been using Hydrofera Blue to the original wound on the left leg. 10-14-2022 upon evaluation today patient appears to be doing well currently in regard to his original wound but unfortunately he has several new wounds which I was completely unaware of before walking into the room to see him today. Unfortunately he seems to not be doing nearly as well as what I saw when I last had an appointment with him 3 weeks ago. Fortunately I do not see any signs of systemic infection though unfortunately locally he has definite infection of the left lower extremity. He is currently on doxycycline I am going to likely have to extend that today. 10-21-2022 upon evaluation today patient appears to be doing well currently in regard to his wounds. He seems to be tolerating the dressing changes without complication. Fortunately there does not appear to be any signs of active infection locally or systemically which is great news and in general I definitely feel like we are on the right track here. Objective Constitutional Well-nourished and well-hydrated in no acute distress. Vitals Time Taken: 8:34 AM, Height: 69 in, Weight: 150 lbs, BMI: 22.1, Temperature: 97.7 F, Pulse: 92 bpm, Respiratory  Rate: 16 breaths/min, Blood Pressure: 114/79 mmHg. Respiratory normal breathing without difficulty. Psychiatric this patient is able to make decisions and demonstrates good insight into disease process. Alert and Oriented x 3. pleasant and cooperative. General Notes: Upon inspection the patient does seem to be doing better than last week although to be honest he is still having quite a bit of an issue here with the left anterior lower extremity where he does have a fairly significant abscess area that is open the good news is though I feel like that since his open is feeling better than it was previous. Integumentary (Hair, Skin) Wound #12 status is Open. Original cause of wound was Gradually Appeared. The date acquired was: 07/12/2019. The wound has been in treatment 45 weeks. The wound is located on the Left,Distal,Medial Ankle. The wound measures 2.3cm length x 0.7cm width x 0.2cm depth; 1.264cm^2 area and 0.253cm^3 volume. There is Fat Layer (Subcutaneous Tissue) exposed. There is a medium amount of serosanguineous drainage noted. The wound margin is distinct with the outline attached to the wound base. There is large (67-100%) pink granulation within the wound bed. There is a small (1-33%) amount of necrotic tissue within the wound bed including Adherent Slough. Wound #15 status is Open. Original cause of wound was Gradually Appeared. The date acquired was: 10/01/2022. The wound has been in treatment 2 weeks. The wound is located on the Left,Dorsal Foot. The wound measures 3.3cm length x 1.2cm width x 0.1cm depth; 3.11cm^2 area and 0.311cm^3 volume. There is a medium amount of serosanguineous drainage noted. There is no granulation within the wound bed. There is a large (67-100%) amount of necrotic tissue within the wound bed including Adherent Slough. Wound #16 status is Open. Original cause of wound was Gradually Appeared. The date acquired was: 09/26/2022. The wound has been in treatment 2  weeks. The wound is located on the Right Calcaneus. The wound measures 1.6cm length x 1.2cm width x 0.1cm depth;  1.508cm^2 area and 0.151cm^3 volume. There is Fat Layer (Subcutaneous Tissue) exposed. There is a medium amount of serosanguineous drainage noted. There is medium (34-66%) red granulation within the wound bed. There is a medium (34-66%) amount of necrotic tissue within the wound bed including Adherent Slough. Wound #17 status is Open. Original cause of wound was Pressure Injury. The date acquired was: 09/30/2022. The wound has been in treatment 1 weeks. The wound is located on the Left,Anterior Lower Leg. The wound measures 3.2cm length x 2.5cm width x 0.1cm depth; 6.283cm^2 area and 0.628cm^3 volume. There is a medium amount of serosanguineous drainage noted. There is small (1-33%) red granulation within the wound bed. There is a medium (34-66%) amount of necrotic tissue within the wound bed including Eschar. Wound #18 status is Open. Original cause of wound was Pressure Injury. The date acquired was: 09/26/2022. The wound has been in treatment 1 weeks. The wound is located on the Right,Medial T Second. The wound measures 0.8cm length x 0.7cm width x 0.1cm depth; 0.44cm^2 area and 0.044cm^3 volume. oe There is a medium amount of serosanguineous drainage noted. There is small (1-33%) red, pink granulation within the wound bed. There is a medium (34-66%) amount of necrotic tissue within the wound bed including Adherent Slough. 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 Bathing/ Shower/ Hygiene: May shower with wound dressing protected with water repellent cover or cast protector. No tub bath. Anesthetic (Use 'Patient Medications' Section for Anesthetic Order Entry): Lidocaine applied to wound bed Edema Control - Lymphedema / Segmental Compressive Device / Other: Ace wraps Patient to wear own compression stockings. Remove compression stockings every night before going to bed and put on every morning when getting up. - please order patient compression stockings 8-70mm/hg ankle 23cm, calf 32cm, leg length floor to knee 40cm Elevate legs to the level of the heart and pump ankles as often as possible Elevate leg(s) parallel to the floor when sitting. Off-Loading: Heel suspension boot - when in bed Medications-Please add to medication list.: Other: - apply AandD ointment to leg, do not apply to wound WOUND #12: - Ankle Wound Laterality: Left, Medial, Distal Cleanser: Soap and Water Every Other Day/30 Days Discharge Instructions: Gently cleanse wound with antibacterial soap, rinse and pat dry prior to dressing wounds Cleanser: Wound Cleanser Every Other Day/30 Days Discharge Instructions: Wash your hands with soap and water. Remove old dressing, discard into plastic bag and place into trash. Cleanse the wound with Wound Cleanser prior to applying a clean dressing using gauze sponges, not tissues or cotton balls. Do not scrub or use excessive force. Pat dry using gauze sponges, not tissue or cotton balls. Peri-Wound Care: AandD Ointment Every Other Day/30 Days Discharge Instructions: Apply AandD Ointment to areas of redness Prim Dressing: Hydrofera Blue Ready Transfer Foam, 4x5 (in/in) Every Other Day/30 Days ary Discharge Instructions: Apply Hydrofera Blue Ready to wound bed as directed Secondary Dressing: ABD Pad 5x9 (in/in) Every Other Day/30 Days Discharge Instructions: Cover with ABD  pad Secured With: Kerlix Roll Sterile or Non-Sterile 6-ply 4.5x4 (yd/yd) Every Other Day/30 Days Discharge Instructions: Apply Kerlix as directed WOUND #  15: - Foot Wound Laterality: Dorsal, Left Cleanser: Soap and Water Every Other Day/30 Days Discharge Instructions: Gently cleanse wound with antibacterial soap, rinse and pat dry prior to dressing wounds Cleanser: Wound Cleanser Every Other Day/30 Days Discharge Instructions: Wash your hands with soap and water. Remove old dressing, discard into plastic bag and place into trash. Cleanse the wound with Wound Cleanser prior to applying a clean dressing using gauze sponges, not tissues or cotton balls. Do not scrub or use excessive force. Pat dry using gauze sponges, not tissue or cotton balls. Peri-Wound Care: AandD Ointment Every Other Day/30 Days Discharge Instructions: Apply AandD Ointment to areas of redness Topical: Santyl Collagenase Ointment, 30 (gm), tube Every Other Day/30 Days Discharge Instructions: apply nickel thick to wound bed only Prim Dressing: Gauze Every Other Day/30 Days ary Discharge Instructions: As directed: dry, moistened with saline or moistened with Dakins Solution Secondary Dressing: ABD Pad 5x9 (in/in) Every Other Day/30 Days Discharge Instructions: Cover with ABD pad Secured With: Kerlix Roll Sterile or Non-Sterile 6-ply 4.5x4 (yd/yd) Every Other Day/30 Days Discharge Instructions: Apply Kerlix as directed WOUND #16: - Calcaneus Wound Laterality: Right Cleanser: Soap and Water Every Other Day/30 Days Discharge Instructions: Gently cleanse wound with antibacterial soap, rinse and pat dry prior to dressing wounds Cleanser: Wound Cleanser Every Other Day/30 Days Discharge Instructions: Wash your hands with soap and water. Remove old dressing, discard into plastic bag and place into trash. Cleanse the wound with Wound Cleanser prior to applying a clean dressing using gauze sponges, not tissues or cotton balls. Do not  scrub or use excessive force. Pat dry using gauze sponges, not tissue or cotton balls. Peri-Wound Care: AandD Ointment Every Other Day/30 Days Discharge Instructions: Apply AandD Ointment to areas of redness Topical: Santyl Collagenase Ointment, 30 (gm), tube Every Other Day/30 Days Discharge Instructions: apply nickel thick to wound bed only Prim Dressing: Gauze Every Other Day/30 Days ary Discharge Instructions: As directed: dry, moistened with saline or moistened with Dakins Solution Secondary Dressing: ABD Pad 5x9 (in/in) Every Other Day/30 Days Discharge Instructions: Cover with ABD pad Secured With: Kerlix Roll Sterile or Non-Sterile 6-ply 4.5x4 (yd/yd) Every Other Day/30 Days Discharge Instructions: Apply Kerlix as directed WOUND #17: - Lower Leg Wound Laterality: Left, Anterior Cleanser: Soap and Water Every Other Day/30 Days Discharge Instructions: Gently cleanse wound with antibacterial soap, rinse and pat dry prior to dressing wounds Cleanser: Wound Cleanser Every Other Day/30 Days Discharge Instructions: Wash your hands with soap and water. Remove old dressing, discard into plastic bag and place into trash. Cleanse the wound with Wound Cleanser prior to applying a clean dressing using gauze sponges, not tissues or cotton balls. Do not scrub or use excessive force. Pat dry using gauze sponges, not tissue or cotton balls. Peri-Wound Care: AandD Ointment Every Other Day/30 Days Discharge Instructions: Apply AandD Ointment to areas of redness Topical: Santyl Collagenase Ointment, 30 (gm), tube Every Other Day/30 Days Discharge Instructions: apply nickel thick to wound bed only Secondary Dressing: ABD Pad 5x9 (in/in) Every Other Day/30 Days Discharge Instructions: Cover with ABD pad Secured With: Kerlix Roll Sterile or Non-Sterile 6-ply 4.5x4 (yd/yd) Every Other Day/30 Days Discharge Instructions: Apply Kerlix as directed WOUND #18: - T Second Wound Laterality: Right,  Medial oe Cleanser: Soap and Water Every Other Day/30 Days Discharge Instructions: Gently cleanse wound with antibacterial soap, rinse and pat dry prior to dressing wounds Cleanser: Wound Cleanser Every Other Day/30 Days Discharge Instructions: Wash your hands with soap and water.  Remove old dressing, discard into plastic bag and place into trash. Cleanse the wound with Wound Cleanser prior to applying a clean dressing using gauze sponges, not tissues or cotton balls. Do not scrub or use excessive force. Pat dry using gauze sponges, not tissue or cotton balls. Chad [name], Chad [name] ([id number]) [id number] Page 13 of 13 Peri-Wound Care: AandD Ointment Every Other Day/30 Days Discharge Instructions: Apply AandD Ointment to areas of redness Topical: Santyl Collagenase Ointment, 30 (gm), tube Every Other Day/30 Days Discharge Instructions: apply nickel thick to wound bed only Prim Dressing: Gauze Every Other Day/30 Days ary Discharge Instructions: As directed: dry, moistened with saline or moistened with Dakins Solution Secondary Dressing: ABD Pad 5x9 (in/in) Every Other Day/30 Days Discharge Instructions: Cover with ABD pad Secured With: Kerlix Roll Sterile or Non-Sterile 6-ply 4.5x4 (yd/yd) Every Other Day/30 Days Discharge Instructions: Apply Kerlix as directed 1. Would recommend currently that we have the patient continue to monitor for any signs of worsening or infection. Obviously based on what I am seeing I do believe that the patient is tolerating the dressing changes without complication. 2. I am also can recommend that we have the patient continue with the Santyl in general to help with all the wounds except for the ankle area were using [facility] he is in agreement with that plan. We will see patient back for reevaluation in 1 week here in the clinic. If anything worsens or changes patient will contact our office for additional recommendations. Electronic  Signature(s) Signed: 10/21/2022 9:41:20 AM By: Worthy Keeler PA-C Entered By: Worthy Keeler on 10/21/2022 09:41:19 -------------------------------------------------------------------------------- SuperBill Details Patient Name: Date of Service: VOLVI, KAPSNER 10/21/2022 Medical Record Number: [id number] Patient Account Number: [id number] Date of Birth/Sex: Treating RN: Jan 03, 1961 (62 y.o. [place] Meth Primary Care Provider: [name] Other Clinician: Referring Provider: Treating Provider/Extender: [name] in Treatment: 45 Diagnosis Coding ICD-10 Codes Code Description [phone] Chronic venous hypertension (idiopathic) with ulcer and inflammation of left lower extremity 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 side 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, stage 3 Facility Procedures : CPT4 Code: [id number] Description: [facility] VISIT-LEV 5 EST PT Modifier: Quantity: 1 Physician Procedures : CPT4 Code Description Modifier [id number] - WC PHYS LEVEL 3 - EST PT ICD-10 Diagnosis Description 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 (idiopathic) with ulcer and inflammation of left lower extremity L97.322 Non-pressure chronic ulcer of left ankle  with fat layer exposed L98.492 Non-pressure chronic ulcer of skin of other sites with fat layer exposed 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 side Quantity: 1 Electronic Signature(s) Signed: 10/21/2022 9:41:57 AM By: Worthy Keeler PA-C Entered By: Worthy Keeler on 10/21/2022 09:41:56

## 2022-10-23 ENCOUNTER — Ambulatory Visit: Payer: Self-pay | Admitting: General Surgery

## 2022-10-23 NOTE — H&P (View-Only) (Signed)
PATIENT PROFILE: Chad [name] is a 62 y.o. male who presents to the Clinic for consultation at the request of Dr. 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  He has been in remission.  He also was found on January 2019 with right colon carcinoma, T4 N1.  He had right colectomy.  Now with new finding of sigmoid colon with associated abnormal lymph nodes.  New sigmoid colon and stage III.   PROBLEM LIST: Malignant neoplasm of the sigmoid colon, stage III Urothelial carcinoma, stage IV  GENERAL REVIEW OF SYSTEMS:   General ROS: Positive for - fatigue, negative for fever, weight gain or weight loss Allergy and Immunology ROS: negative for - hives  Hematological and Lymphatic ROS: negative for - bleeding problems or bruising, negative for palpable nodes Endocrine ROS: negative for - heat or cold intolerance, hair changes Respiratory ROS: negative for - cough, shortness of breath or wheezing Cardiovascular ROS: no chest pain or palpitations GI ROS: negative for nausea, vomiting, abdominal pain, diarrhea, constipation Musculoskeletal ROS: Positive for - joint swelling or muscle pain Neurological ROS: negative for - confusion, syncope Dermatological ROS: negative for pruritus and rash Psychiatric: negative for anxiety, depression, difficulty sleeping and memory loss  MEDICATIONS: Current Outpatient Medications  Medication Sig Dispense Refill   acetaminophen (TYLENOL) 325 MG tablet Take 650 mg by mouth 3 (three) times a day     aspirin 81 MG EC tablet Take 1 tablet by mouth once daily     bisacodyL (DULCOLAX) 5 mg EC tablet Take two tablets morning and two tablets afternoon day prior to Miralax prep. 4 tablet 0   carboxymethylcellulose sodium 1 % Drop Apply to eye 2 (two) times daily     cholecalciferol (VITAMIN D3) 2,000 unit capsule  Take 2,000 Units by mouth once daily     clopidogreL (PLAVIX) 75 mg tablet Take 1 tablet by mouth once daily     cyclobenzaprine (FLEXERIL) 10 MG tablet Take 10 mg by mouth nightly     gabapentin (NEURONTIN) 100 MG capsule Take 100 mg by mouth 3 (three) times a day     gentamicin 0.1 % cream Apply topically once daily     mirtazapine (REMERON) 7.5 MG tablet Take 7.5 mg by mouth nightly     oxyCODONE (DAZIDOX) 10 mg immediate release tablet Take 10 mg by mouth 4 (four) times daily     pantoprazole (PROTONIX) 40 MG DR tablet Take 40 mg by mouth once daily     polyethylene glycol (MIRALAX) powder One bottle for colonoscopy prep. Use as directed. 255 g 0   sennosides-docusate (SENOKOT-S) 8.6-50 mg tablet Take 1 tablet by mouth once daily     silver-calcium alginate 2 X 2 " Bndg Apply topically once daily     Zinc Mth/Copper/Saw Palm/Gnsg (PROSTATE HEALTH FORMULA ORAL) Take 30 mLs by mouth 2 (two) times daily     atorvastatin (LIPITOR) 10 MG tablet Take 1 tablet by mouth once daily     collagenase (SANTYL) ointment Apply 1 Application topically once daily (Patient not taking: Reported on 12/13/2020)     diclofenac (VOLTAREN) 1 % topical gel Apply 2 g topically 4 (four) times daily as needed (Patient not taking: Reported on 12/13/2020)     escitalopram oxalate (LEXAPRO) 5 MG tablet Take 5 mg by mouth once daily (Patient not taking: Reported on   12/13/2020)     metroNIDAZOLE (FLAGYL) 500 MG tablet Take 2 tablets at 2 pm, 3 pm and 10 pm the day before surgery. 6 tablet 0   neomycin 500 mg tablet Take 2 tablets at 2 pm, 3 pm and 10 pm the day before surgery. 6 tablet 0   No current facility-administered medications for this visit.    ALLERGIES: Penicillins  PAST MEDICAL HISTORY: Past Medical History:  Diagnosis Date   Anemia    Anxiety    ARF (acute renal failure) (CMS-HCC)    Bladder cancer (CMS-HCC)    COPD (chronic obstructive pulmonary disease) (CMS-HCC)    COPD (chronic obstructive pulmonary  disease) (CMS-HCC)    Depression    Dysphagia    Family history of colon cancer    Family history of kidney cancer    Family history of leukemia    GERD (gastroesophageal reflux disease)    Hepatitis    Hepatitis C, chronic (CMS-HCC)    History of stroke    Hydronephrosis    Hyperlipidemia    Hypertension    Malignant neoplasm of colon (CMS-HCC)    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   Dr Anna   Nerve pain    Peripheral vascular disease (CMS-HCC)    Prostate cancer (CMS-HCC)    Ureteral cancer, 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   Urinary frequency    Venous hypertension of lower extremity, bilateral     PAST SURGICAL HISTORY: Past Surgical History:  Procedure Laterality Date   colon surgery   07/2017   [place] Extended right hemicolectomy    cystoscopy with sten placement  Right 08/30/2018   Surgeon Brandon,Ahsley, MD, 8.10.20,9.29.19   Porta cath insertion   02/28/2019   Dr Dew   CYSTOSCOPY  03/07/2019   ANGIOGRAPHY LOWER EXTREMITY  05/30/2019   Dr.Jason Dew, 10.26.20    CYSTOSCOPY Right 11/21/2019   with stent   ANGIOGRAPHY LOWER EXTREMITY Right 02/13/2020   ANGIOGRAPHY LOWER EXTREMITY Left 02/20/2020   COLONOSCOPY  11/06/2020   COLONOSCOPY  07/31/2021   Tubular adenoma/PHx CC/Repeat will be decided after CT scan/JWB   CYSTOSCOPY     With retrogrades right 2.3.20    tumor removed        FAMILY HISTORY: Family History  Problem Relation Age of Onset   Prostate cancer Father    Cancer Brother    Leukemia Son    Cancer Paternal Uncle    Cancer Maternal Grandmother    Kidney cancer Paternal Grandfather      SOCIAL HISTORY: Social History   Socioeconomic History   Marital status: Single  Tobacco Use   Smoking status: Every Day   Smokeless tobacco: Never  Substance and Sexual Activity   Alcohol use: Not Currently   Drug use: Not Currently    Comment: history of maijuana use   Sexual activity: Defer    PHYSICAL EXAM: Vitals:   10/23/22  1047  BP: 116/82  Pulse: 100   Body mass index is 25.37 kg/m. Weight: 73.5 kg (162 lb)   GENERAL: Alert, active, oriented x3  HEENT: Pupils equal reactive to light. Extraocular movements are intact. Sclera clear. Palpebral conjunctiva normal red color.Pharynx clear.  NECK: Supple with no palpable mass and no adenopathy.  LUNGS: Sound clear with no rales rhonchi or wheezes.  HEART: Regular rhythm S1 and S2 without murmur.  ABDOMEN: Soft and depressible, nontender with no palpable mass, no hepatomegaly.   EXTREMITIES: Well-developed well-nourished symmetrical with no dependent edema.  NEUROLOGICAL: Awake alert   oriented, facial expression symmetrical, moving all extremities.  REVIEW OF DATA: I have reviewed the following data today: No visits with results within 3 Month(s) from this visit.  Latest known visit with results is:  No results found for any previous visit.   EXAM: CT CHEST, ABDOMEN, AND PELVIS WITH CONTRAST   TECHNIQUE: Multidetector CT imaging of the chest, abdomen and pelvis was performed following the standard protocol during bolus administration of intravenous contrast.   RADIATION DOSE REDUCTION: This exam was performed according to the departmental dose-optimization program which includes automated exposure control, adjustment of the mA and/or kV according to patient size and/or use of iterative reconstruction technique.   CONTRAST:  100mL OMNIPAQUE IOHEXOL 300 MG/ML  SOLN   COMPARISON:  Multiple priors including most recent CT April 17, 2022.   FINDINGS: CT CHEST FINDINGS   Cardiovascular: Accessed right chest Port-A-Cath with tip in the right atrium. Aortic atherosclerosis. No central pulmonary embolus on this nondedicated study. Normal size heart. No significant pericardial effusion/thickening.   Mediastinum/Nodes: No suspicious thyroid nodule. No pathologically enlarged mediastinal or hilar lymph nodes. Similar prominent bilateral  axillary lymph nodes for instance a right axillary lymph node measuring 7 mm on in short axis on image 15/2 is unchanged. Gas fluid levels in a patulous esophagus.   Lungs/Pleura: Tiny nodule in the anterior right upper lobe measuring 3 mm on image 35/3 is unchanged. Additional tiny anterior right upper lobe pulmonary nodule measuring 2 mm on image 43/3 is also unchanged. No new suspicious pulmonary nodules or masses.   Biapical pleuroparenchymal scarring. Scattered ground-glass opacities in the right lung for instance in the right upper lobe on image 68/3 and on image 53/3. Bibasilar atelectasis/scarring. No pleural effusion.   Musculoskeletal: No aggressive lytic or blastic lesion of bone.   CT ABDOMEN PELVIS FINDINGS   Hepatobiliary: Diffuse hepatic steatosis. No suspicious hepatic lesion. Gallbladder is unremarkable. No biliary ductal dilation.   Pancreas: No pancreatic ductal dilation or evidence of acute inflammation   Spleen: No splenomegaly.   Adrenals/Urinary Tract: Bilateral adrenal glands are within normal limits.   Right double-J nephroureteral stent in place without hydronephrosis persistent calcification of the distal pigtail in the urinary bladder. Similar right renal atrophy. Infiltrative soft tissue about the right renal collecting system extends into the right renal pelvis and along the proximal ureter, again measured at the level of the inferior pole the infiltrative mass measures 4.4 x 2.2 cm on image 74/2, unchanged. Similar distal extension of the infiltrative soft tissue along the proximal right ureter measuring proximally 2 cm in length on image 66/6, unchanged.   No left-sided hydronephrosis.  Stable 2 cm benign left renal cyst.   Similar left-sided asymmetric wall thickening of the urinary bladder measuring 1 cm in thickness on image 90/4, unchanged.   Stomach/Bowel: Radiopaque enteric contrast material traverses the rectum. Stomach is  unremarkable for degree of distension. No pathologic dilation of small or large bowel. Prior right hemicolectomy with enterocolonic anastomosis   Similar appearance of the asymmetric sigmoid colonic wall thickening on image 103/2 compatible with patient's known colonic neoplasm. Previously indexed adjacent lymph node measures 14 mm in short axis on image 99/2 previously 11 mm. Increased size of the adjacent nodule now measuring 12 mm previously 5 mm.   Vascular/Lymphatic: Increased size of a left external iliac lymph node now measuring 8 mm in short axis on image 102/2 previously 6 mm.   Previously indexed portacaval lymph node measures 11 mm in short axis on image   60/2, unchanged.   Left inguinal lymph node measures 14 mm in short axis on image 130/2 previously 12 mm. Lymph node measures 4 mm on image 107/2 previously 6 mm.   Aortic atherosclerosis.   Reproductive: Prostate is unremarkable.   Other: Small fat and fluid containing right inguinal hernia. Small fat containing supraumbilical hernia.   Musculoskeletal: No aggressive lytic or blastic lesion of bone. Degenerative change of the bilateral hips.   IMPRESSION: 1. Similar appearance of the asymmetric sigmoid colonic wall thickening compatible with patient's known colonic neoplasm. 2. Increased size of the adjacent lymph node and nodule as well as left external iliac lymph node, concerning for disease progression. 3. Similar appearance of the infiltrative soft tissue about the right renal collecting system and proximal right ureter. Similar right renal atrophy with double-J ureteral stent in place without hydronephrosis. 4. Unchanged left-sided asymmetric wall thickening of the urinary bladder. 5. Scattered ground-glass opacities in the right lung are nonspecific but favored to reflect an infectious or inflammatory etiology. 6. Stable tiny benign pulmonary nodules 7.  Aortic Atherosclerosis (ICD10-I70.0).      Electronically Signed   By: Jeffrey  Waltz M.D.   On: 09/10/2022 15:31    ASSESSMENT: Chad [name] is a 62 y.o. male presenting for consultation for surgical intervention of stage III colon cancer.  Patient with stage III colon cancer on surveillance.  He is also on Keytruda for urothelial carcinoma.  I was consulted by his medical oncologist that due to increasing size of lymph nodes he is recommended to proceed with a partial colectomy of the sigmoid colon.  I discussed with patient the risk of the surgery including bleeding, infection, injury to adjacent organs such as kidney, ureter, bladder, spleen, among others.  Also discussed risk of bowel perforation.  Discussed with patient recommendation of having permanent end colostomy for palliative purposes.  The patient endorses that if these will extend his life he would like to proceed with surgery.  Denies any chest pain or shortness of breath.  Patient currently living in nursing facility.  Malignant neoplasm of sigmoid colon (CMS/HHS-HCC) [C18.7]  PLAN: Robotic assisted laparoscopic partial colectomy with end colostomy creation (44206) Hold Plavix 7 days before surgery  Hold Aspirin 5 days before surgery Take prescribed antibiotics the day before surgery as prescribed Bowel prep the day before surgery as prescribed Ostomy nurse consult on pre testing day for ostomy marking Contact us if you have any concern.   Patient and and his son verbalized understanding, all questions were answered, and were agreeable with the plan outlined above.   Kashia Brossard Cintron-Diaz, MD  Electronically signed by Rayon Mcchristian Cintron-Diaz, MD  

## 2022-10-23 NOTE — H&P (Signed)
PATIENT PROFILE: Chad [name] is a 62 y.o. male who presents to the Clinic for consultation at the request of Dr. 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  He has been in remission.  He also was found on January 2019 with right colon carcinoma, T4 N1.  He had right colectomy.  Now with new finding of sigmoid colon with associated abnormal lymph nodes.  New sigmoid colon and stage III.   PROBLEM LIST: Malignant neoplasm of the sigmoid colon, stage III Urothelial carcinoma, stage IV  GENERAL REVIEW OF SYSTEMS:   General ROS: Positive for - fatigue, negative for fever, weight gain or weight loss Allergy and Immunology ROS: negative for - hives  Hematological and Lymphatic ROS: negative for - bleeding problems or bruising, negative for palpable nodes Endocrine ROS: negative for - heat or cold intolerance, hair changes Respiratory ROS: negative for - cough, shortness of breath or wheezing Cardiovascular ROS: no chest pain or palpitations GI ROS: negative for nausea, vomiting, abdominal pain, diarrhea, constipation Musculoskeletal ROS: Positive for - joint swelling or muscle pain Neurological ROS: negative for - confusion, syncope Dermatological ROS: negative for pruritus and rash Psychiatric: negative for anxiety, depression, difficulty sleeping and memory loss  MEDICATIONS: Current Outpatient Medications  Medication Sig Dispense Refill   acetaminophen (TYLENOL) 325 MG tablet Take 650 mg by mouth 3 (three) times a day     aspirin 81 MG EC tablet Take 1 tablet by mouth once daily     bisacodyL (DULCOLAX) 5 mg EC tablet Take two tablets morning and two tablets afternoon day prior to Miralax prep. 4 tablet 0   carboxymethylcellulose sodium 1 % Drop Apply to eye 2 (two) times daily     cholecalciferol (VITAMIN D3) 2,000 unit capsule  Take 2,000 Units by mouth once daily     clopidogreL (PLAVIX) 75 mg tablet Take 1 tablet by mouth once daily     cyclobenzaprine (FLEXERIL) 10 MG tablet Take 10 mg by mouth nightly     gabapentin (NEURONTIN) 100 MG capsule Take 100 mg by mouth 3 (three) times a day     gentamicin 0.1 % cream Apply topically once daily     mirtazapine (REMERON) 7.5 MG tablet Take 7.5 mg by mouth nightly     oxyCODONE (DAZIDOX) 10 mg immediate release tablet Take 10 mg by mouth 4 (four) times daily     pantoprazole (PROTONIX) 40 MG DR tablet Take 40 mg by mouth once daily     polyethylene glycol (MIRALAX) powder One bottle for colonoscopy prep. Use as directed. 255 g 0   sennosides-docusate (SENOKOT-S) 8.6-50 mg tablet Take 1 tablet by mouth once daily     silver-calcium alginate 2 X 2 " Bndg Apply topically once daily     Zinc Mth/Copper/Saw Palm/Gnsg (PROSTATE HEALTH FORMULA ORAL) Take 30 mLs by mouth 2 (two) times daily     atorvastatin (LIPITOR) 10 MG tablet Take 1 tablet by mouth once daily     collagenase (SANTYL) ointment Apply 1 Application topically once daily (Patient not taking: Reported on 12/13/2020)     diclofenac (VOLTAREN) 1 % topical gel Apply 2 g topically 4 (four) times daily as needed (Patient not taking: Reported on 12/13/2020)     escitalopram oxalate (LEXAPRO) 5 MG tablet Take 5 mg by mouth once daily (Patient not taking: Reported on  12/13/2020)     metroNIDAZOLE (FLAGYL) 500 MG tablet Take 2 tablets at 2 pm, 3 pm and 10 pm the day before surgery. 6 tablet 0   neomycin 500 mg tablet Take 2 tablets at 2 pm, 3 pm and 10 pm the day before surgery. 6 tablet 0   No current facility-administered medications for this visit.    ALLERGIES: Penicillins  PAST MEDICAL HISTORY: Past Medical History:  Diagnosis Date   Anemia    Anxiety    ARF (acute renal failure) (CMS-HCC)    Bladder cancer (CMS-HCC)    COPD (chronic obstructive pulmonary disease) (CMS-HCC)    COPD (chronic obstructive pulmonary  disease) (CMS-HCC)    Depression    Dysphagia    Family history of colon cancer    Family history of kidney cancer    Family history of leukemia    GERD (gastroesophageal reflux disease)    Hepatitis    Hepatitis C, chronic (CMS-HCC)    History of stroke    Hydronephrosis    Hyperlipidemia    Hypertension    Malignant neoplasm of colon (CMS-HCC)    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   Dr Chad [name]   Nerve pain    Peripheral vascular disease (CMS-HCC)    Prostate cancer (CMS-HCC)    Ureteral cancer, 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   Urinary frequency    Venous hypertension of lower extremity, bilateral     PAST SURGICAL HISTORY: Past Surgical History:  Procedure Laterality Date   colon surgery   07/2017   [place] Extended right hemicolectomy    cystoscopy with sten placement  Right 08/30/2018   Surgeon Chad Fantasia, MD, 8.10.20,9.29.19   [place] cath insertion   02/28/2019   Dr Chad [name]   CYSTOSCOPY  03/07/2019   ANGIOGRAPHY LOWER EXTREMITY  05/30/2019   Dr.Jason [name], 10.26.20    CYSTOSCOPY Right 11/21/2019   with stent   ANGIOGRAPHY LOWER EXTREMITY Right 02/13/2020   ANGIOGRAPHY LOWER EXTREMITY Left 02/20/2020   COLONOSCOPY  11/06/2020   COLONOSCOPY  07/31/2021   Tubular adenoma/PHx CC/Repeat will be decided after CT scan/JWB   CYSTOSCOPY     With retrogrades right 2.3.20    tumor removed        FAMILY HISTORY: Family History  Problem Relation Age of Onset   Prostate cancer Father    Cancer Brother    Leukemia Son    Cancer Paternal Uncle    Cancer Maternal Grandmother    Kidney cancer Paternal Grandfather      SOCIAL HISTORY: Social History   Socioeconomic History   Marital status: Single  Tobacco Use   Smoking status: Every Day   Smokeless tobacco: Never  Substance and Sexual Activity   Alcohol use: Not Currently   Drug use: Not Currently    Comment: history of maijuana use   Sexual activity: Defer    PHYSICAL EXAM: Vitals:   10/23/22  1047  BP: 116/82  Pulse: 100   Body mass index is 25.37 kg/m. Weight: 73.5 kg (162 lb)   GENERAL: Alert, active, oriented x3  HEENT: Pupils equal reactive to light. Extraocular movements are intact. Sclera clear. Palpebral conjunctiva normal red color.Pharynx clear.  NECK: Supple with no palpable mass and no adenopathy.  LUNGS: Sound clear with no rales rhonchi or wheezes.  HEART: Regular rhythm S1 and S2 without murmur.  ABDOMEN: Soft and depressible, nontender with no palpable mass, no hepatomegaly.   EXTREMITIES: Well-developed well-nourished symmetrical with no dependent edema.  NEUROLOGICAL: Awake alert  oriented, facial expression symmetrical, moving all extremities.  REVIEW OF DATA: I have reviewed the following data today: No visits with results within 3 Month(s) from this visit.  Latest known visit with results is:  No results found for any previous visit.   EXAM: CT CHEST, ABDOMEN, AND PELVIS WITH CONTRAST   TECHNIQUE: Multidetector CT imaging of the chest, abdomen and pelvis was performed following the standard protocol during bolus administration of intravenous contrast.   RADIATION DOSE REDUCTION: This exam was performed according to the departmental dose-optimization program which includes automated exposure control, adjustment of the mA and/or kV according to patient size and/or use of iterative reconstruction technique.   CONTRAST:  151mL OMNIPAQUE IOHEXOL 300 MG/ML  SOLN   COMPARISON:  Multiple priors including most recent CT April 17, 2022.   FINDINGS: CT CHEST FINDINGS   Cardiovascular: Accessed right chest Port-A-Cath with tip in the right atrium. Aortic atherosclerosis. No central pulmonary embolus on this nondedicated study. Normal size heart. No significant pericardial effusion/thickening.   Mediastinum/Nodes: No suspicious thyroid nodule. No pathologically enlarged mediastinal or hilar lymph nodes. Similar prominent bilateral  axillary lymph nodes for instance a right axillary lymph node measuring 7 mm on in short axis on image 15/2 is unchanged. Gas fluid levels in a patulous esophagus.   Lungs/Pleura: Tiny nodule in the anterior right upper lobe measuring 3 mm on image 35/3 is unchanged. Additional tiny anterior right upper lobe pulmonary nodule measuring 2 mm on image 43/3 is also unchanged. No new suspicious pulmonary nodules or masses.   Biapical pleuroparenchymal scarring. Scattered ground-glass opacities in the right lung for instance in the right upper lobe on image 68/3 and on image 53/3. Bibasilar atelectasis/scarring. No pleural effusion.   Musculoskeletal: No aggressive lytic or blastic lesion of bone.   CT ABDOMEN PELVIS FINDINGS   Hepatobiliary: Diffuse hepatic steatosis. No suspicious hepatic lesion. Gallbladder is unremarkable. No biliary ductal dilation.   Pancreas: No pancreatic ductal dilation or evidence of acute inflammation   Spleen: No splenomegaly.   Adrenals/Urinary Tract: Bilateral adrenal glands are within normal limits.   Right double-J nephroureteral stent in place without hydronephrosis persistent calcification of the distal pigtail in the urinary bladder. Similar right renal atrophy. Infiltrative soft tissue about the right renal collecting system extends into the right renal pelvis and along the proximal ureter, again measured at the level of the inferior pole the infiltrative mass measures 4.4 x 2.2 cm on image 74/2, unchanged. Similar distal extension of the infiltrative soft tissue along the proximal right ureter measuring proximally 2 cm in length on image 66/6, unchanged.   No left-sided hydronephrosis.  Stable 2 cm benign left renal cyst.   Similar left-sided asymmetric wall thickening of the urinary bladder measuring 1 cm in thickness on image 90/4, unchanged.   Stomach/Bowel: Radiopaque enteric contrast material traverses the rectum. Stomach is  unremarkable for degree of distension. No pathologic dilation of small or large bowel. Prior right hemicolectomy with enterocolonic anastomosis   Similar appearance of the asymmetric sigmoid colonic wall thickening on image 103/2 compatible with patient's known colonic neoplasm. Previously indexed adjacent lymph node measures 14 mm in short axis on image 99/2 previously 11 mm. Increased size of the adjacent nodule now measuring 12 mm previously 5 mm.   Vascular/Lymphatic: Increased size of a left external iliac lymph node now measuring 8 mm in short axis on image 102/2 previously 6 mm.   Previously indexed portacaval lymph node measures 11 mm in short axis on image  60/2, unchanged.   Left inguinal lymph node measures 14 mm in short axis on image 130/2 previously 12 mm. Lymph node measures 4 mm on image 107/2 previously 6 mm.   Aortic atherosclerosis.   Reproductive: Prostate is unremarkable.   Other: Small fat and fluid containing right inguinal hernia. Small fat containing supraumbilical hernia.   Musculoskeletal: No aggressive lytic or blastic lesion of bone. Degenerative change of the bilateral hips.   IMPRESSION: 1. Similar appearance of the asymmetric sigmoid colonic wall thickening compatible with patient's known colonic neoplasm. 2. Increased size of the adjacent lymph node and nodule as well as left external iliac lymph node, concerning for disease progression. 3. Similar appearance of the infiltrative soft tissue about the right renal collecting system and proximal right ureter. Similar right renal atrophy with double-J ureteral stent in place without hydronephrosis. 4. Unchanged left-sided asymmetric wall thickening of the urinary bladder. 5. Scattered ground-glass opacities in the right lung are nonspecific but favored to reflect an infectious or inflammatory etiology. 6. Stable tiny benign pulmonary nodules 7.  Aortic Atherosclerosis (ICD10-I70.0).      Electronically Signed   By: Dahlia Bailiff M.D.   On: 09/10/2022 15:31    ASSESSMENT: Mr. Fagerberg is a 62 y.o. male presenting for consultation for surgical intervention of stage III colon cancer.  Patient with stage III colon cancer on surveillance.  He is also on Keytruda for urothelial carcinoma.  I was consulted by his medical oncologist that due to increasing size of lymph nodes he is recommended to proceed with a partial colectomy of the sigmoid colon.  I discussed with patient the risk of the surgery including bleeding, infection, injury to adjacent organs such as kidney, ureter, bladder, spleen, among others.  Also discussed risk of bowel perforation.  Discussed with patient recommendation of having permanent end colostomy for palliative purposes.  The patient endorses that if these will extend his life he would like to proceed with surgery.  Denies any chest pain or shortness of breath.  Patient currently living in nursing facility.  Malignant neoplasm of sigmoid colon (CMS/HHS-HCC) [C18.7]  PLAN: Robotic assisted laparoscopic partial colectomy with end colostomy creation [id number]) Hold Plavix 7 days before surgery  Hold Aspirin 5 days before surgery Take prescribed antibiotics the day before surgery as prescribed Bowel prep the day before surgery as prescribed Ostomy nurse consult on pre testing day for ostomy marking Contact us if you have any concern.   Patient and and his son verbalized understanding, all questions were answered, and were agreeable with the plan outlined above.   Herbert Pun, MD  Electronically signed by Herbert Pun, MD

## 2022-10-27 MED FILL — Iron Sucrose Inj 20 MG/ML (Fe Equiv): INTRAVENOUS | Qty: 10 | Status: AC

## 2022-10-28 ENCOUNTER — Inpatient Hospital Stay: Payer: Medicaid Other

## 2022-10-28 ENCOUNTER — Inpatient Hospital Stay: Payer: Medicaid Other | Attending: Internal Medicine | Admitting: Internal Medicine

## 2022-10-28 VITALS — BP 125/87 | HR 75 | Temp 96.1°F | Resp 16 | Wt 160.0 lb

## 2022-10-28 DIAGNOSIS — R634 Abnormal weight loss: Secondary | ICD-10-CM | POA: Diagnosis not present

## 2022-10-28 DIAGNOSIS — Z7189 Other specified counseling: Secondary | ICD-10-CM

## 2022-10-28 DIAGNOSIS — C661 Malignant neoplasm of right ureter: Secondary | ICD-10-CM | POA: Diagnosis not present

## 2022-10-28 DIAGNOSIS — Z5112 Encounter for antineoplastic immunotherapy: Secondary | ICD-10-CM | POA: Diagnosis present

## 2022-10-28 DIAGNOSIS — D509 Iron deficiency anemia, unspecified: Secondary | ICD-10-CM | POA: Diagnosis not present

## 2022-10-28 DIAGNOSIS — C187 Malignant neoplasm of sigmoid colon: Secondary | ICD-10-CM | POA: Diagnosis not present

## 2022-10-28 DIAGNOSIS — Z79620 Long term (current) use of immunosuppressive biologic: Secondary | ICD-10-CM | POA: Diagnosis not present

## 2022-10-28 DIAGNOSIS — Z515 Encounter for palliative care: Secondary | ICD-10-CM

## 2022-10-28 LAB — CBC WITH DIFFERENTIAL/PLATELET
Abs Immature Granulocytes: 0.02 10*3/uL (ref 0.00–0.07)
Basophils Absolute: 0.1 10*3/uL (ref 0.0–0.1)
Basophils Relative: 1 %
Eosinophils Absolute: 0.2 10*3/uL (ref 0.0–0.5)
Eosinophils Relative: 4 %
HCT: 41 % (ref 39.0–52.0)
Hemoglobin: 13.2 g/dL (ref 13.0–17.0)
Immature Granulocytes: 0 %
Lymphocytes Relative: 28 %
Lymphs Abs: 1.6 10*3/uL (ref 0.7–4.0)
MCH: 26.5 pg (ref 26.0–34.0)
MCHC: 32.2 g/dL (ref 30.0–36.0)
MCV: 82.3 fL (ref 80.0–100.0)
Monocytes Absolute: 0.6 10*3/uL (ref 0.1–1.0)
Monocytes Relative: 10 %
Neutro Abs: 3.3 10*3/uL (ref 1.7–7.7)
Neutrophils Relative %: 57 %
Platelets: 137 10*3/uL — ABNORMAL LOW (ref 150–400)
RBC: 4.98 MIL/uL (ref 4.22–5.81)
RDW: 16.3 % — ABNORMAL HIGH (ref 11.5–15.5)
WBC: 5.7 10*3/uL (ref 4.0–10.5)
nRBC: 0 % (ref 0.0–0.2)

## 2022-10-28 LAB — COMPREHENSIVE METABOLIC PANEL
ALT: 21 U/L (ref 0–44)
AST: 35 U/L (ref 15–41)
Albumin: 3.2 g/dL — ABNORMAL LOW (ref 3.5–5.0)
Alkaline Phosphatase: 110 U/L (ref 38–126)
Anion gap: 4 — ABNORMAL LOW (ref 5–15)
BUN: 32 mg/dL — ABNORMAL HIGH (ref 8–23)
CO2: 24 mmol/L (ref 22–32)
Calcium: 8.5 mg/dL — ABNORMAL LOW (ref 8.9–10.3)
Chloride: 107 mmol/L (ref 98–111)
Creatinine, Ser: 1.16 mg/dL (ref 0.61–1.24)
GFR, Estimated: >60 mL/min (ref >60)
Glucose, Bld: 91 mg/dL (ref 70–99)
Potassium: 4 mmol/L (ref 3.5–5.1)
Sodium: 135 mmol/L (ref 135–145)
Total Bilirubin: 0.4 mg/dL (ref 0.3–1.2)
Total Protein: 7.7 g/dL (ref 6.5–8.1)

## 2022-10-28 LAB — TSH: TSH: 1.542 u[IU]/mL (ref 0.350–4.500)

## 2022-10-28 MED ORDER — HEPARIN SOD (PORK) LOCK FLUSH 100 UNIT/ML IV SOLN
INTRAVENOUS | Status: AC
  Filled 2022-10-28: qty 5

## 2022-10-28 MED ORDER — SODIUM CHLORIDE 0.9 % IV SOLN
200.0000 mg | Freq: Once | INTRAVENOUS | Status: AC
  Administered 2022-10-28: 200 mg via INTRAVENOUS
  Filled 2022-10-28: qty 8

## 2022-10-28 MED ORDER — HEPARIN SOD (PORK) LOCK FLUSH 100 UNIT/ML IV SOLN
500.0000 [IU] | Freq: Once | INTRAVENOUS | Status: AC | PRN
  Administered 2022-10-28: 500 [IU]
  Filled 2022-10-28: qty 5

## 2022-10-28 MED ORDER — SODIUM CHLORIDE 0.9 % IV SOLN
Freq: Once | INTRAVENOUS | Status: AC
  Filled 2022-10-28: qty 250

## 2022-10-28 NOTE — Progress Notes (Signed)
Pt in for follow up, reports decreased appetite and weight loss.

## 2022-10-28 NOTE — Assessment & Plan Note (Addendum)
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 On keytruda-CT scan: FEB 14th, 2024-  stable disease noted in the proximal right ureter-without any evidence of progression.  See below-regarding sigmoid cancer- Stable  # proceed with [place] today- Labs today reviewed; Stable TSH dec 2023--WNL.   # Left LE cellulitis- on antibiotics [would care]-s/p evaluation with wound care.  S/p antibiotics improved.  Monitor closely  # Sigmoid colon cancer-[under surveillaince] [history of Lynch syndrome]- JAN 2023- S/p colonoscopy -poor prep/multiple failed attempts. FEB 14th, 2024- CT scan- Focal wall thickening in the proximal sigmoid colon; continued worsening of pericolonic lymph nodes; [s/p evaluation with Dr. Byrnett/Dr.cintron]-patient awaiting palliative colectomy; permanent ostomy on April 22.  # Iron deficiency anemia-hemoglobin 11-12; FEB - iron studies-10%; ferritin-27 -hemoglobin 13.  HOLD  Venofer.  # Hypocalcemia- Mild- on Ca+vit D; G+FEB 2024- 42. Stable   #DISPOSITION: #  today Keytruda; HOLD venofer # follow up in 6 weeks MD- labs-cbc/cmp; Keytruda;venofer; -Dr.B.

## 2022-10-28 NOTE — Progress Notes (Signed)
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  Is alone/in a wheelchair.  Chad [name] 62 y.o.  male above history of stage IV-ureteral cancer/history of stage III colon cancer right side; recurrent sigmoid colon cancer [un-resected/under surveillance] and multiple other comorbidities currently on Keytruda is here for follow-up.  In the interim patient evaluated by surgery for recurrent sigmoid cancer.  [name] was held last visit because of ongoing cellulitis/antibiotics. Currently improved.    Patient reports decreased appetite and weight loss.     Denies any nausea. No diarrhea. Feels some fatigue. Chronic pain in left leg.  Denies any worsening abdominal pain.  No blood in stools or black-colored stools.  No hematuria.  Chronic shortness of breath.  Not any worse.  Review of Systems  Constitutional:  Positive for malaise/fatigue. Negative for chills, diaphoresis, fever and weight loss.  HENT:  Negative for nosebleeds and sore throat.   Eyes:  Negative for double vision.  Respiratory:  Positive for cough and shortness of breath. Negative for hemoptysis and wheezing.   Cardiovascular:  Negative for chest pain, palpitations and orthopnea.  Gastrointestinal:  Positive for constipation. Negative for abdominal pain, blood in stool, diarrhea, heartburn, melena, nausea and vomiting.  Genitourinary:  Negative for dysuria, frequency and urgency.  Musculoskeletal:  Positive for back pain and joint pain.  Skin: Negative.  Negative for itching and rash.  Neurological:  Positive for focal weakness. Negative for dizziness, tingling, weakness and headaches.       Chronic left-sided weakness upper than lower extremity.  Endo/Heme/Allergies:  Does not bruise/bleed easily.  Psychiatric/Behavioral:  Negative for depression. The patient is not nervous/anxious and does not have insomnia.      MEDICAL HISTORY:  Past Medical History:  Diagnosis Date   Anemia    Anxiety    ARF (acute respiratory failure) (HCC)    Bladder cancer (HCC)    COPD (chronic obstructive pulmonary disease) ([place])    Depression    Dysphagia    Family history of colon cancer    Family history of kidney cancer    Family history of leukemia    Family history of prostate cancer  GERD (gastroesophageal reflux disease)    Hepatitis    chronic hep c   Hydronephrosis    Hydronephrosis with ureteral stricture    Hyperlipidemia    Knee pain    Left   Malignant neoplasm of colon (HCC)    Nerve pain    Peripheral  vascular disease (HCC)    Prostate cancer (HCC)    Stroke (HCC)    Ureteral cancer, right ([place])    Urinary frequency    Venous hypertension of both lower extremities     SURGICAL HISTORY: Past Surgical History:  Procedure Laterality Date   COLON SURGERY     En bloc extended right hemicolectomy 07/2017   COLONOSCOPY WITH PROPOFOL N/A 11/06/2020   Procedure: COLONOSCOPY WITH PROPOFOL;  Surgeon: Jonathon Bellows, MD;  Location: [facility] ENDOSCOPY;  Service: Gastroenterology;  Laterality: N/A;   COLONOSCOPY WITH PROPOFOL N/A 07/31/2021   Procedure: COLONOSCOPY WITH PROPOFOL;  Surgeon: Laterrance Bellow, MD;  Location: [facility] ENDOSCOPY;  Service: Endoscopy;  Laterality: N/A;   CYSTOSCOPY W/ RETROGRADES Right 08/30/2018   Procedure: CYSTOSCOPY WITH RETROGRADE PYELOGRAM;  Surgeon: Hollice Espy, MD;  Location: [facility] ORS;  Service: Urology;  Laterality: Right;   CYSTOSCOPY WITH STENT PLACEMENT Right 04/25/2018   Procedure: CYSTOSCOPY WITH STENT PLACEMENT;  Surgeon: Hollice Espy, MD;  Location: [facility] ORS;  Service: Urology;  Laterality: Right;   CYSTOSCOPY WITH STENT PLACEMENT Right 08/30/2018   Procedure: [place];  Surgeon: Hollice Espy, MD;  Location: [facility] ORS;  Service: Urology;  Laterality: Right;   CYSTOSCOPY WITH STENT PLACEMENT Right 03/07/2019   Procedure: CYSTOSCOPY WITH STENT [place];  Surgeon: Hollice Espy, MD;  Location: [facility] ORS;  Service: Urology;  Laterality: Right;   CYSTOSCOPY WITH STENT PLACEMENT Right 11/21/2019   Procedure: CYSTOSCOPY WITH STENT [place];  Surgeon: Hollice Espy, MD;  Location: [facility] ORS;  Service: Urology;  Laterality: Right;   LOWER EXTREMITY ANGIOGRAPHY Left 05/23/2019   Procedure: LOWER EXTREMITY ANGIOGRAPHY;  Surgeon: Algernon Huxley, MD;  Location: [facility] CV LAB;  Service: Cardiovascular;  Laterality: Left;   LOWER EXTREMITY ANGIOGRAPHY Right 05/30/2019   Procedure: LOWER EXTREMITY ANGIOGRAPHY;  Surgeon: Algernon Huxley, MD;  Location: [facility] CV LAB;  Service: Cardiovascular;  Laterality: Right;   LOWER EXTREMITY ANGIOGRAPHY Right 02/13/2020   Procedure: LOWER EXTREMITY ANGIOGRAPHY;  Surgeon: Algernon Huxley, MD;  Location: [facility] CV LAB;  Service: Cardiovascular;  Laterality: Right;   LOWER EXTREMITY ANGIOGRAPHY Left 02/20/2020   Procedure: LOWER EXTREMITY ANGIOGRAPHY;  Surgeon: Algernon Huxley, MD;  Location: [facility] CV LAB;  Service: Cardiovascular;  Laterality: Left;   PORTA CATH INSERTION N/A 02/28/2019   Procedure: PORTA CATH INSERTION;  Surgeon: Algernon Huxley, MD;  Location: [facility] CV LAB;  Service: Cardiovascular;  Laterality: N/A;   tumor removed       SOCIAL HISTORY: Social History   Socioeconomic History   Marital status: Single    Spouse name: Not on file   Number of children: Not on file   Years of education: Not on file   Highest education level: Not on file  Occupational History   Not on file  Tobacco Use   Smoking status: Every Day    Packs/day: 1    Types: Cigarettes   Smokeless tobacco: Never  Vaping Use   Vaping Use: Never used  Substance and Sexual Activity   Alcohol use: Not Currently   Drug use: Not Currently   Sexual activity: Not Currently  Other Topics  Concern   Not on file  Social History Narrative    used to live [place]; moved  To [place]- end of April 2019; in Nursing home; 1pp/day; quit alcohol. 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        Family- dad- prostate ca [at 6y]; brother- 55 died of prostate cancer; brother- 29- no cancers [[place]]; sonGerald Stabs [[place]];[name]-32y prostate ca [facility] [place]]; daughter- 19 [[place]]; another daughter 70 [[place]/addict]. will refer genetics counseling. Given MSI- abnormal; highly suspicious of Lynch syndrome.  Patient's son [name] aware of high possible lynch syndrome.   Social Determinants of Health   Financial Resource Strain: Not on file  Food Insecurity: Not on file  Transportation Needs: Not on file  Physical Activity: Not on file   Stress: Not on file  Social Connections: Not on file  Intimate Partner Violence: Not on file    FAMILY HISTORY: Family History  Problem Relation Age of Onset   Prostate cancer Father 53   Cancer Brother 64       unsure type   Cancer Paternal Uncle        unsure type   Cancer Maternal Grandmother        unsure type   Cancer Paternal Grandmother        unsure type   Kidney cancer Paternal Grandfather    Cancer Other        unsure types   Leukemia Son    Cancer Son        other cancers, possibly colon    ALLERGIES:  is allergic to penicillins.  MEDICATIONS:  Current Outpatient Medications  Medication Sig Dispense Refill   acetaminophen (TYLENOL) 325 MG tablet Take 650 mg by mouth 3 (three) times daily.      aspirin EC 81 MG tablet Take 1 tablet (81 mg total) by mouth daily. 150 tablet 2   atorvastatin (LIPITOR) 10 MG tablet Take 1 tablet (10 mg total) by mouth daily. 30 tablet 11   Calcium Carbonate Antacid 600 MG chewable tablet Chew 2 tablets (1,200 mg total) by mouth daily. 90 tablet 3   carboxymethylcellulose 1 % ophthalmic solution Apply 1 drop to eye 2 (two) times daily.     cholecalciferol (VITAMIN D3) 25 MCG (1000 UNIT) tablet Take 1 tablet (1,000 Units total) by mouth daily. 1 tablet 3   clopidogrel (PLAVIX) 75 MG tablet Take 75 mg by mouth daily.     cyclobenzaprine (FLEXERIL) 10 MG tablet Take 10 mg by mouth at bedtime.     escitalopram (LEXAPRO) 5 MG tablet Take 5 mg by mouth daily.     gabapentin (NEURONTIN) 100 MG capsule Take 100 mg by mouth 3 (three) times daily.     mirtazapine (REMERON) 7.5 MG tablet Take 7.5 mg by mouth at bedtime.     Oxycodone HCl 10 MG TABS Take 5 mg by mouth every 6 (six) hours as needed for pain.     senna-docusate (SENOKOT-S) 8.6-50 MG tablet Take 1 tablet by mouth daily.     tamsulosin (FLOMAX) 0.4 MG CAPS capsule Take 1 capsule (0.4 mg total) by mouth daily after supper. 30 capsule 3   gentamicin ointment (GARAMYCIN) 0.1 % Apply 1  application. topically 3 (three) times daily. (Patient not taking: Reported on 10/28/2022)     levothyroxine (SYNTHROID) 25 MCG tablet Take 1 tablet (25 mcg total) by mouth daily before breakfast. (Patient not taking: Reported on 07/01/2022) 30 tablet 1   oxycodone (OXY-IR) 5 MG capsule Take  5 mg by mouth every 6 (six) hours. (Patient not taking: Reported on 10/28/2022)     pantoprazole (PROTONIX) 40 MG tablet Take 40 mg by mouth daily. (Patient not taking: Reported on 07/01/2022)     No current facility-administered medications for this visit.      Marland Kitchen  PHYSICAL EXAMINATION: ECOG PERFORMANCE STATUS: 1 - Symptomatic but completely ambulatory  Vitals:   10/28/22 0800 10/28/22 0850  BP: 125/87 125/87  Pulse: 75 75  Resp: 16 16  Temp:  (!) 96.1 F (35.6 C)  SpO2:  98%      Filed Weights   10/28/22 0800 10/28/22 0850  Weight: 160 lb (72.6 kg) 160 lb (72.6 kg)     Left lower extremity erythema/swelling-cellulitis.  Physical Exam HENT:     Head: Normocephalic and atraumatic.     Mouth/Throat:     Pharynx: No oropharyngeal exudate.  Eyes:     Pupils: Pupils are equal, round, and reactive to light.  Cardiovascular:     Rate and Rhythm: Normal rate and regular rhythm.  Pulmonary:     Effort: No respiratory distress.     Breath sounds: No wheezing.  Abdominal:     General: Bowel sounds are normal. There is no distension.     Palpations: Abdomen is soft. There is no mass.     Tenderness: There is no abdominal tenderness. There is no guarding or rebound.  Musculoskeletal:        General: No tenderness. Normal range of motion.     Cervical back: Normal range of motion and neck supple.  Skin:    General: Skin is warm.     Comments: Bilateral lower extremity ulcerations noted.  Pulses intact bilaterally.  Cystlike lesion noted right elbow-nontender.  No erythema noted.  Neurological:     Mental Status: He is oriented to person, place, and time.     Comments: Sleepy but easily  arousable.  Chronic weakness left upper and lower extremity.  Psychiatric:        Mood and Affect: Affect normal.      LABORATORY DATA:  I have reviewed the data as listed Lab Results  Component Value Date   WBC 5.7 10/28/2022   HGB 13.2 10/28/2022   HCT 41.0 10/28/2022   MCV 82.3 10/28/2022   PLT 137 (L) 10/28/2022   Recent Labs    09/18/22 0829 10/09/22 0837 10/28/22 0754  NA 136 133* 135  K 4.2 4.2 4.0  CL 104 102 107  CO2 24 24 24   GLUCOSE 97 112* 91  BUN 23 21 32*  CREATININE 1.08 1.20 1.16  CALCIUM 8.6* 8.2* 8.5*  GFRNONAA >60 >60 >60  PROT 7.9 7.6 7.7  ALBUMIN 3.6 3.1* 3.2*  AST 37 36 35  ALT 25 21 21   ALKPHOS 119 103 110  BILITOT 0.4 0.7 0.4    ASSESSMENT & PLAN:   Ureteral cancer, right (HCC) # High-grade urothelial cancer/cytology; likely of the right renal pelvis /upper ureter. On keytruda-CT scan: FEB 14th, 2024-  stable disease noted in the proximal right ureter-without any evidence of progression.  See below-regarding sigmoid cancer- Stable  # proceed with [place] today- Labs today reviewed; Stable TSH dec 2023--WNL.   # Left LE cellulitis- on antibiotics [would care]-s/p evaluation with wound care.  S/p antibiotics improved.  Monitor closely  # Sigmoid colon cancer-[under surveillaince] [history of Lynch syndrome]- JAN 2023- S/p colonoscopy -poor prep/multiple failed attempts. FEB 14th, 2024- CT scan- Focal wall thickening in the proximal sigmoid  colon; continued worsening of pericolonic lymph nodes; [s/p evaluation with Dr. Byrnett/Dr.cintron]-patient awaiting palliative colectomy; permanent ostomy on April 22.  # Iron deficiency anemia-hemoglobin 11-12; FEB - iron studies-10%; ferritin-27 -hemoglobin 13.  HOLD  Venofer.  # Hypocalcemia- Mild- on Ca+vit D; G+FEB 2024- 42. Stable   #DISPOSITION: #  today Keytruda; HOLD venofer # follow up in 6 weeks MD- labs-cbc/cmp; Keytruda;venofer; -Dr.B.    All questions were answered. The patient knows  to call the clinic with any problems, questions or concerns.    Cammie Sickle, MD 10/28/2022 9:24 AM

## 2022-10-28 NOTE — Patient Instructions (Signed)
[facility]  Discharge Instructions: Thank you for choosing [facility] to provide your oncology and hematology care.  If you have a lab appointment with the Cancer Center, please go directly to the Cancer Center and check in at the registration area.  Wear comfortable clothing and clothing appropriate for easy access to any Portacath or PICC line.   We strive to give you quality time with your provider. You may need to reschedule your appointment if you arrive late (15 or more minutes).  Arriving late affects you and other patients whose appointments are after yours.  Also, if you miss three or more appointments without notifying the office, you may be dismissed from the clinic at the provider's discretion.      For prescription refill requests, have your pharmacy contact our office and allow 72 hours for refills to be completed.    Today you received the following chemotherapy and/or immunotherapy agents- keytruda      To help prevent nausea and vomiting after your treatment, we encourage you to take your nausea medication as directed.  BELOW ARE SYMPTOMS THAT SHOULD BE REPORTED IMMEDIATELY: *FEVER GREATER THAN 100.4 F (38 C) OR HIGHER *CHILLS OR SWEATING *NAUSEA AND VOMITING THAT IS NOT CONTROLLED WITH YOUR NAUSEA MEDICATION *UNUSUAL SHORTNESS OF BREATH *UNUSUAL BRUISING OR BLEEDING *URINARY PROBLEMS (pain or burning when urinating, or frequent urination) *BOWEL PROBLEMS (unusual diarrhea, constipation, pain near the anus) TENDERNESS IN MOUTH AND THROAT WITH OR WITHOUT PRESENCE OF ULCERS (sore throat, sores in mouth, or a toothache) UNUSUAL RASH, SWELLING OR PAIN  UNUSUAL VAGINAL DISCHARGE OR ITCHING   Items with * indicate a potential emergency and should be followed up as soon as possible or go to the Emergency Department if any problems should occur.  Please show the CHEMOTHERAPY ALERT CARD or IMMUNOTHERAPY ALERT CARD at check-in to  the Emergency Department and triage nurse.  Should you have questions after your visit or need to cancel or reschedule your appointment, please contact [facility]  [phone] and follow the prompts.  Office hours are 8:00 a.m. to 4:30 p.m. Monday - Friday. Please note that voicemails left after 4:00 p.m. may not be returned until the following business day.  We are closed weekends and major holidays. You have access to a nurse at all times for urgent questions. Please call the main number to the clinic [phone] and follow the prompts.  For any non-urgent questions, you may also contact your provider using MyChart. We now offer e-Visits for anyone 18 and older to request care online for non-urgent symptoms. For details visit [email or web address].   Also download the MyChart app! Go to the app store, search "MyChart", open the app, select [facility], and log in with your MyChart username and password.    

## 2022-10-29 ENCOUNTER — Non-Acute Institutional Stay: Payer: Medicaid Other | Admitting: Nurse Practitioner

## 2022-10-29 VITALS — BP 108/74 | HR 88 | Temp 97.8°F | Resp 18 | Wt 162.8 lb

## 2022-10-29 DIAGNOSIS — Z515 Encounter for palliative care: Secondary | ICD-10-CM

## 2022-10-29 DIAGNOSIS — C661 Malignant neoplasm of right ureter: Secondary | ICD-10-CM

## 2022-10-29 DIAGNOSIS — Z720 Tobacco use: Secondary | ICD-10-CM

## 2022-10-29 DIAGNOSIS — G894 Chronic pain syndrome: Secondary | ICD-10-CM

## 2022-10-29 DIAGNOSIS — R630 Anorexia: Secondary | ICD-10-CM

## 2022-10-29 LAB — T4: T4, Total: 8.3 ug/dL (ref 4.5–12.0)

## 2022-10-29 NOTE — Progress Notes (Addendum)
[facility] Consult Note Telephone: [phone]  Fax: [phone]    Date of encounter: 10/29/22 10:32 AM PATIENT NAME: Chad [name] [address] [place] [id number]   [phone] (home)  DOB: 01/01/1961 MRN: [id number] PRIMARY CARE PROVIDER:    [name] [address] [place] [id number] [phone]  RESPONSIBLE PARTY:    Contact Information     Name Relation Home Work Mobile   Chad [name], Chad [name]   [phone]         I met face to face with patient in facility. Palliative Care was asked to follow this patient by consultation request of  Chad [name] to address advance care planning and complex medical decision making. This is a follow up visit.                                  ASSESSMENT AND PLAN / RECOMMENDATIONS: Symptom Management/Plan: 1. 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 Discussed nutrition.  04/01/2022 weight 156.8 lbs 07/31/2022 weight 167.2 lbs 09/12/2022 weight 169 lbs 10/09/2022 weight 162.8 lbs 10/08/2022 WBC 5.7; hgb 12.3; hct 37.2; platelets 137; sodium 139; potassium 4.3; cloride 105; co2 26; calcium 8.5; bun 19.2; creatinine 1.11; glucose 104; albumin 3.0; total protein 6.2; AST 43; ALT 23 3. Chronic pain reviewed; reviewed chronic pain, medications reviewed; Chad [name] endorses manageable with current oxycodone, discussed and educated continue to monitor on pain scale, monitor efficacy vs adverse side effects.Will continue to monitor; encouraged to ask when in pain, Continue to follow by psychotherapy and psychiatry for counseling; supportive services.   4. PVD; recent ABI's ordered by facility resulted and Chad [name] wishes are to continue to pursue aggressive interventions and treatment options. Recommended  facility to f/u with Vascular for options. Continue with current care to BLE/wounds   5. Palliative care encounter; Palliative medicine team will continue to support patient, patient's family, and medical team. Visit consisted of counseling and education dealing with the complex and emotionally intense issues of symptom management and palliative care in the setting of serious and potentially life-threatening illness   5. Smoking cessation; reviewed and declined.        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; # Colon ca- stage III   GOALS: palliative   CURRENT/MOST RECENT THERAPY: Keytruda [C]      Urothelial cancer (HCC)      Initial Diagnosis      Urothelial cancer (HCC)      Ureteral cancer, right (HCC)    05/26/2018 Initial Diagnosis      Ureteral cancer, right (HCC)      06/15/2018 -  Chemotherapy      Patient is on Treatment Plan : BLADDER Pembrolizumab (200) q21d  06/21/2018 - 03/17/2022 Chemotherapy      Patient is on Treatment Plan : urothelial cancer- pembrolizumab q21d         Follow up Palliative Care Visit: Palliative care will continue to follow for complex medical decision making, advance care planning, and clarification of goals. Return 4 to 8 weeks or prn. I spent 47 minutes providing this consultation starting at 10:00 am. More than 50% of the time in this consultation was spent in  counseling and care coordination. PPS: 50% Chief Complaint: Follow up palliative consult for complex medical decision making HISTORY OF PRESENT ILLNESS:  Chad [name] is a 62 y.o. year old male  with multiple medical problems including   late onset CVA, urothelial cancer, prostate cancer, chronic hepatitis C, hyperlipidemia, colon surgery, protein calorie malnutrition, anxiety, depression. Chad [name] resides at [facility]. Chad [name] is followed by Dr Donneta Romberg Oncology for right ureteral cancer; on [place] and received dose on 10/28/2022; sigmoid colon cancer with h/o lynch syndrome with CT revealing focal wall thickening in proximal sigmoid colon with worsening of pericolonic lymph nodes with awaiting a palliative colectomy with permanent ostomy. IDA with venofer held on this visit with hgb 11-12. Chad [name] appetite is decreased, worsening with facility completing ABI's, continue with wound care, cellulitis to BLE. Chad [name] continues to go to smoking patio daily, spends a lot of time in his room, will go to dining area for meals. At present Chad [name] is lying in his bed, sleeping, awoke to verbal cues. We talked about how he has been feeling, ros, functional abilities, getting oob. He has not been out to smoke today though declined smoking cessation. We talked about daily routine, recent appointment with Oncology, wound. We talked about goc, including wishes to continue aggressive interventions, treat what is treatable and continue with pending palliative colectomy with ostomy. Support provided, talked about quality of life, coping strategies. Will continue with ongoing discussions of goc, supportive role. PC f/u visit further discussion monitor trends of appetite, weights, monitor for functional, cognitive decline with chronic disease progression, assess any active symptoms, supportive role.   History obtained from review of EMR, discussion with staff and Chad [name].  I reviewed available labs,  medications, imaging, studies and related documents from the EMR.  Records reviewed and summarized above.  Exam Constitutional: NAD General: pleasant male ENMT: oral mucous membranes moist CV: S1S2, RRR Pulmonary: breath sounds clear MSK: w/d dependent, +Edema BLE Skin: warm and dry Neuro:  left hemiplegia,  no cognitive impairment Psych: non-anxious affect, A and O x 3  Thank you for the opportunity to participate in the care of Chad. Aughenbaugh. Please call our office at [phone] if we can be of additional assistance.   Tritia Endo [place], NP

## 2022-10-30 ENCOUNTER — Other Ambulatory Visit
Admission: RE | Admit: 2022-10-30 | Discharge: 2022-10-30 | Disposition: A | Discharge status: Home / Self Care | Payer: Medicaid Other | Source: Ambulatory Visit | Attending: Physician Assistant | Admitting: Physician Assistant

## 2022-10-30 ENCOUNTER — Encounter: Payer: Medicaid Other | Attending: Physician Assistant | Admitting: Physician Assistant

## 2022-10-30 DIAGNOSIS — L299 Pruritus, unspecified: Secondary | ICD-10-CM | POA: Insufficient documentation

## 2022-10-30 DIAGNOSIS — L98492 Non-pressure chronic ulcer of skin of other sites with fat layer exposed: Secondary | ICD-10-CM | POA: Insufficient documentation

## 2022-10-30 DIAGNOSIS — I872 Venous insufficiency (chronic) (peripheral): Secondary | ICD-10-CM | POA: Diagnosis not present

## 2022-10-30 DIAGNOSIS — B1920 Unspecified viral hepatitis C without hepatic coma: Secondary | ICD-10-CM | POA: Diagnosis not present

## 2022-10-30 DIAGNOSIS — C639 Malignant neoplasm of male genital organ, unspecified: Secondary | ICD-10-CM | POA: Diagnosis not present

## 2022-10-30 DIAGNOSIS — Z933 Colostomy status: Secondary | ICD-10-CM | POA: Insufficient documentation

## 2022-10-30 DIAGNOSIS — L03116 Cellulitis of left lower limb: Secondary | ICD-10-CM | POA: Diagnosis present

## 2022-10-30 DIAGNOSIS — B9689 Other specified bacterial agents as the cause of diseases classified elsewhere: Secondary | ICD-10-CM | POA: Diagnosis not present

## 2022-10-30 DIAGNOSIS — I69354 Hemiplegia and hemiparesis following cerebral infarction affecting left non-dominant side: Secondary | ICD-10-CM | POA: Diagnosis not present

## 2022-10-30 DIAGNOSIS — L97322 Non-pressure chronic ulcer of left ankle with fat layer exposed: Secondary | ICD-10-CM | POA: Diagnosis present

## 2022-10-30 DIAGNOSIS — Z5900 Homelessness unspecified: Secondary | ICD-10-CM | POA: Insufficient documentation

## 2022-10-30 NOTE — Progress Notes (Addendum)
SABINO, DENNING ([id number]) [id number] Page 1 of 17 Visit Report for 10/30/2022 Chief Complaint Document Details Patient Name: Date of Service: Chad [name], Chad [name] 10/30/2022 8:30 A M Medical Record Number: [id number] Patient Account Number: [id number] Date of Birth/Sex: Treating RN: [birth year] (62 y.o. Male) Midge Aver Primary Care Provider: [name] Other Clinician: Referring Provider: Treating Provider/Extender: [name] Weeks in Treatment: 46 Information Obtained from: Patient Chief Complaint Left ankle ulcer, Left dorsal foot wound, right heel Electronic Signature(s) Signed: 10/30/2022 8:17:29 AM By: [name] PA-C Entered By: [name] on 10/30/2022 08:17:29 -------------------------------------------------------------------------------- Debridement Details Patient Name: Date of Service: Chad [name], Chad [name] 10/30/2022 8:30 A M Medical Record Number: [id number] Patient Account Number: [id number] Date of Birth/Sex: Treating RN: [birth year] (62 y.o. Male) Midge Aver Primary Care Provider: [name] Other Clinician: Referring Provider: Treating Provider/Extender: [name] Weeks in Treatment: 46 Debridement Performed for Assessment: Wound #16 Right Calcaneus Performed By: Physician Nelida Meuse., PA-C Debridement Type: Chemical/Enzymatic/Mechanical Agent Used: Santyl Level of Consciousness (Pre-procedure): Awake and Alert Pre-procedure Verification/Time Out Yes - 09:10 Taken: Start Time: 09:10 Instrument: Other : gauze Bleeding: None Response to Treatment: Procedure was tolerated well Level of Consciousness (Post- Awake and Alert procedure): Post Debridement Measurements of Total Wound Length: (cm) 1 Stage: Category/Stage III Width: (cm) 1 Depth: (cm) 0.1 Volume: (cm) 0.079 Character of Wound/Ulcer Post Debridement: Stable Post Procedure Diagnosis Same as Pre-procedure Electronic  Signature(s) Signed: 10/30/2022 4:51:01 PM By: Midge Aver MSN RN CNS WTA Signed: 10/31/2022 8:47:01 AM By: [name] PA-C Entered By: Midge Aver on 10/30/2022 09:09:42 -------------------------------------------------------------------------------- Debridement Details Patient Name: Date of Service: Chad [name] 10/30/2022 8:30 A M Medical Record Number: [id number] Patient Account Number: [id number] Date of Birth/Sex: Treating RN: [birth year] (62 y.o. Male) Midge Aver Primary Care Provider: [name] Other Clinician: [name] ([id number]) [id number] Page 2 of 17 Referring Provider: Treating Provider/Extender: [name] in Treatment: 46 Debridement Performed for Assessment: Wound #17 Left,Anterior Lower Leg Performed By: Physician Nelida Meuse., PA-C Debridement Type: Chemical/Enzymatic/Mechanical Agent Used: Santyl Level of Consciousness (Pre-procedure): Awake and Alert Pre-procedure Verification/Time Out Yes - 09:10 Taken: Start Time: 09:10 Instrument: Other : gauze Bleeding: None Response to Treatment: Procedure was tolerated well Level of Consciousness (Post- Awake and Alert procedure): Post Debridement Measurements of Total Wound Length: (cm) 3 Stage: Category/Stage II Width: (cm) 2.3 Depth: (cm) 0.3 Volume: (cm) 1.626 Character of Wound/Ulcer Post Debridement: Stable Post Procedure Diagnosis Same as Pre-procedure Electronic Signature(s) Signed: 10/30/2022 4:51:01 PM By: Midge Aver MSN RN CNS WTA Signed: 10/31/2022 8:47:01 AM By: [name] PA-C Entered By: Midge Aver on 10/30/2022 09:10:13 -------------------------------------------------------------------------------- Debridement Details Patient Name: Date of Service: Chad [name] 10/30/2022 8:30 A M Medical Record Number: [id number] Patient Account Number: [id number] Date of Birth/Sex: Treating RN: 03/10/1961 (62 y.o. Male) Midge Aver Primary Care Provider: [name] Other Clinician: Referring Provider: Treating Provider/Extender: [name] Weeks in Treatment: 46 Debridement Performed for Assessment: Wound #15 Left,Dorsal Foot Performed By: Physician Nelida Meuse., PA-C Debridement Type: Chemical/Enzymatic/Mechanical Agent Used: Santyl Severity of Tissue Pre Debridement: Fat layer exposed Level of Consciousness (Pre-procedure): Awake and Alert Pre-procedure Verification/Time Out Yes - 09:10 Taken: Start Time: 09:10 Instrument: Other : gauze Bleeding: None Response to Treatment: Procedure was tolerated well Level of Consciousness (Post- Awake and Alert procedure): Post Debridement Measurements of Total Wound Length: (cm) 2.5 Width: (cm) 1.5 Depth: (cm) 0.1 Volume: (cm) 0.295 Character of Wound/Ulcer  Post Debridement: Stable Severity of Tissue Post Debridement: Fat layer exposed Post Procedure Diagnosis Same as Pre-procedure Electronic Signature(s) Signed: 10/30/2022 10:51:46 AM By: Midge Aver MSN RN CNS WTA Signed: 10/31/2022 8:47:01 AM By: [name] PA-C Entered By: Midge Aver on 10/30/2022 10:51:46 [name] ([id number]) [id number] Page 3 of 17 -------------------------------------------------------------------------------- Debridement Details Patient Name: Date of Service: Chad [name], Chad [name] 10/30/2022 8:30 A M Medical Record Number: [id number] Patient Account Number: [id number] Date of Birth/Sex: Treating RN: 04-09-1961 (62 y.o. Male) Midge Aver Primary Care Provider: [name] Other Clinician: Referring Provider: Treating Provider/Extender: [name] Weeks in Treatment: 46 Debridement Performed for Assessment: Wound #18 Right,Medial T Second oe Performed By: Physician Nelida Meuse., PA-C Debridement Type: Chemical/Enzymatic/Mechanical Agent Used: Santyl Level of Consciousness (Pre-procedure): Awake and  Alert Pre-procedure Verification/Time Out Yes - 09:10 Taken: Start Time: 09:10 Instrument: Other : gauze Bleeding: None Response to Treatment: Procedure was tolerated well Level of Consciousness (Post- Awake and Alert procedure): Post Debridement Measurements of Total Wound Length: (cm) 0.5 Stage: Category/Stage II Width: (cm) 0.6 Depth: (cm) 0.1 Volume: (cm) 0.024 Character of Wound/Ulcer Post Debridement: Stable Post Procedure Diagnosis Same as Pre-procedure Electronic Signature(s) Signed: 10/30/2022 12:52:37 PM By: Midge Aver MSN RN CNS WTA Signed: 10/31/2022 8:47:01 AM By: [name] PA-C Entered By: Midge Aver on 10/30/2022 12:52:37 -------------------------------------------------------------------------------- HPI Details Patient Name: Date of Service: Chad [name] 10/30/2022 8:30 A M Medical Record Number: [id number] Patient Account Number: [id number] Date of Birth/Sex: Treating RN: 1961-05-22 (62 y.o. Male) Midge Aver Primary Care Provider: [name] Other Clinician: Referring Provider: Treating Provider/Extender: [name] Weeks in Treatment: 46 History of Present Illness HPI Description: 10/08/18 on evaluation today patient actually presents to our office for initial evaluation concerning wounds that he has of the bilateral lower extremities. 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 He also has bilateral venous stasis. He apparently has been homeless currently following discharge from the hospital apparently he has been placed at [facility] which is is a skilled nursing facility locally. Nonetheless fortunately he does not show any signs of infection at this time which is good news. In fact several of the wound actually appears to be showing some signs of improvement already  in my pinion. There are a couple areas in the left leg in particular there likely gonna require some sharp debridement to help clear away some necrotic tissue and help with more sufficient healing. No fevers, chills, nausea, or vomiting noted at this time. 10/15/18 on evaluation today patient actually appears to be doing very well in regard to his bilateral lower extremities. He's been tolerating the dressing changes without complication. Fortunately there does not appear to be any evidence of active infection at this time which is great news. Overall I'm actually very pleased with how this has progressed in just one visits time. Readmission: 08/14/2020 upon evaluation today patient presents for re-evaluation here in our clinic. He is having issues with his left ankle region as well as his right toe and his right heel. He tells me that the toe and heel actually began as a area that was itching that he was scratching and then subsequently opened up into wounds. These may have been abscess areas I presume based on what I am seeing currently. With regard to his left ankle region he tells  me this was a similar type occurrence although he does have venous stasis this very well may be more of a venous leg ulcer more than anything. Nonetheless I do believe that the patient would benefit from appropriate and aggressive wound care to try to help get things under better control here. He does have history of a stroke on the left side affecting him to some degree there that he is able to stand although he does have some residual weakness. Otherwise again the patient does have chronic venous insufficiency as previously noted. His arterial studies most recently obtained showed that he had an ABI on the right of 1.16 with a TBI of 0.52 and on the left and ABI of 1.14 with a TBI of 0.81. That was obtained on 06/19/2020. Chad [name], Chad [name] ([id number]) [id number] Page 4 of 17 08/28/2020 upon  evaluation today patient appears to be doing decently well in regard to his wounds in general. He has been tolerating the dressing changes without complication. Fortunately there does not appear to be any signs of active infection which is great news. With that being said I think the [facility] is doing a good job I would recommend that we likely continue with that currently. 09/11/2020 upon evaluation today patient's wounds did not appear to be doing too poorly but again he is not really showing signs of significant improvement with regard to any of the wounds on the right. None of them have Hydrofera Blue on them I am not exactly sure why this is not being followed as the facility did not contact us to let us know of any issues with obtaining dressings or otherwise. With that being said he is supposed to be using Hydrofera Blue on both of the wounds on the right foot as well as the ankle wound on the left side. 09/18/2020 upon evaluation today patient appears to be doing poorly with regard to his wounds. Again right now the left ankle in particular showed signs of extreme maceration. Apparently he was told by someone with staff at [facility] they could not get the [facility]. With that being said this is something that is never been relayed to [place] one way or another. Also the patient subsequently has not supposed to have a border gauze dressing on. He should have an ABD pad and roll gauze to secure as this drains much too much just to have a border gauze dressing to cover. Nonetheless the fact that they are not using the appropriate dressing is directly causing deterioration of the left ankle wound it is significantly worse today compared to what it was previous. I did attempt to call [facility] while the patient was here I called three times and got no one to even pick up the phone. After this I had my for an office coordinator call and she was able to finally get through and  leave a message with the D ON as of dictation of this note which is roughly about an hour and a half later I still have not been able to speak with anyone at the facility. 09/25/2020 upon evaluation today patient actually showing signs of good improvement which is excellent news. He has been tolerating the dressing changes without complication. Fortunately there is no signs of active infection which is great news. No fevers, chills, nausea, vomiting, or diarrhea. I do feel like the facility has been doing a much better job at taking care of him as far as the dressings are concerned. However  the director of nursing never did call me back. 10/09/2020 upon evaluation today patient appears to be doing well with regard to his wound. The toe ulcer did require some debridement but the other 2 areas actually appear to be doing quite well. 10/19/2020 upon evaluation today patient actually appears to be doing very well in regard to his wounds. In fact the heel does appear to be completely healed. The toe is doing better in the medial ankle on the left is also doing better. Overall I think he is headed in the right direction. 10/26/2020 upon evaluation today patient appears to be doing well with regard to his wound. He is showing signs of improvement which is great news and overall I am very pleased with where things stand today. No fevers, chills, nausea, vomiting, or diarrhea. 11/02/2020 upon evaluation today patient appears to be doing well with regard to his wounds. He has been tolerating the dressing changes without complication overall I am extremely pleased with where things stand today. He in regard to the toe is almost completely healed and the medial ankle on the left is doing much better. 11/09/2020 upon evaluation today patient appears to be doing a little poorly in regard to his left medial ankle ulcer. Fortunately there does not appear to be any signs of systemic infection but unfortunately locally he does  appear to be infected in fact he has blue-green drainage consistent with Pseudomonas. 11/16/2020 upon evaluation today patient appears to be doing well with regard to his wound. It actually appears to be doing better. I did place him on gentamicin cream since the Cipro was actually resistant even though he was positive for Pseudomonas on culture. Overall I think that he does seem to be doing better though I am unsure whether or not they have actually been putting the cream on. The patient is not sure that we did talk to the nurse directly and she was going to initiate that treatment. Fortunately there does not appear to be any signs of active infection at this time. No fevers, chills, nausea, vomiting, or diarrhea. 4/28; the area on the right second toe is close to healed. Left medial ankle required debridement 12/07/2020 upon evaluation today patient appears to be doing well with regard to his wounds. In fact the right second toe appears to be completely healed which is great news. Fortunately there does not appear to be any signs of active infection at this time which is also great news. I think we can probably discontinue the gentamicin on top of everything else. 12/14/2020 upon evaluation today patient appears to be doing well with regard to his wound. He is making good progress and overall very pleased with where things stand today. There is no signs of active infection at this time which is great news. 12/28/2020 upon evaluation today patient appears to be doing well with regard to his wounds. He has been tolerating the dressing changes without complication. Fortunately there is no signs of active infection at this time. No fevers, chills, nausea, vomiting, or diarrhea. 12/28/2020 upon evaluation today patient's wound bed actually showed signs of excellent improvement. He has great epithelization and granulation I do not see any signs of infection overall I am extremely pleased with where things stand  at this point. No fevers, chills, nausea, vomiting, or diarrhea. 01/11/2021 upon evaluation today patient appears to be doing well with regard to his wound on his leg. He has been tolerating the dressing changes without complication. Fortunately there does  not appear to be any signs of active infection which is great news. No fevers, chills, nausea, vomiting, or diarrhea. 01/25/2021 upon evaluation today patient appears to be doing well with regard to his wound. He has been tolerating the dressing changes without complication. Fortunately the collagen seems to be doing a great job which is excellent news. No fevers, chills, nausea, vomiting, or diarrhea. 02/08/2021 upon evaluation today patient's wound is actually looking a little bit worse especially in the periwound compared to previous. Fortunately there does not appear to be any signs of infection which is great news with that being said he does have some irritation around the periphery of the wound which has me more concerned. He actually had a dressing on that had not been changed in 3 days. He also is supposed to have daily dressing changes. With regard to the dressing applied he had a silver alginate dressing and silver collagen is what is recommended and ordered. He also had no Desitin around the edges of the wound in the periwound region although that is on the order inspect to be done as well. In general I was very concerned I did contact [facility] actually spoke with Verlon Au who is the scheduling individual and subsequently she stated that she would pass the information to the D ON apparently the D ON was not available to talk to me when I call today. 02/18/2021 upon evaluation today patient's wound is actually showing signs of improvement. Fortunately there does not appear to be any evidence of infection which is great news overall I am extremely pleased with where things stand today. No fevers, chills, nausea, vomiting, or  diarrhea. 8/3; patient presents for 1 week follow-up. He has no issues or complaints today. He denies signs of infection. 03/11/2021 upon evaluation today patient appears to be doing well with regard to his wound. He does have a little bit of slough noted on the surface of the wound but fortunately there does not appear to be any signs of active infection at this time. No fevers, chills, nausea, vomiting, or diarrhea. 03/18/2021 upon evaluation today patient appears to be doing well with regard to his wound. He has been tolerating the dressing changes without complication. There was a little irritation more proximal to where the wound was that was not noted last week but nonetheless this is very superficial just seems to be more irritation we just need to make sure to put a good amount of the zinc over the area in my opinion. Otherwise he does not seem to be doing significantly worse at all which is great news. 03/25/2021 upon evaluation today patient appears to be doing well with regard to his wound. He is going require some sharp debridement today to clear with some of the necrotic debris. I did perform this today without complication postdebridement wound bed appears to be doing much better this is great news. 04/08/2021 upon evaluation today patient appears to be doing decently well in regard to his wound although the overall measurement is not significantly smaller compared to previous. It is gone down a little bit but still the facility continues to not really put the appropriate dressings in place in fact he was supposed to have collagen we think he probably had more of an allergy to At this point. Fortunately there does not appear to be any signs of active infection systemically though locally I do not see anything on initial visualization Chad [name], Chad [name] ([id number]) [id number] Page 5 of 17 either as  far as erythema or warmth. 04/15/2021 upon evaluation today patient  appears to be doing well with regard to his wound. He is actually showing signs of improvement. I did place him on antibiotics last week, Cipro. He has been taking that 2 times a day and seems to be tolerating it very well. I do not see any evidence of worsening and in fact the overall appearance of the wound is smaller today which is also great news. 9/26; left medial ankle chronic venous insufficiency wound is improved. Using Hydrofera Blue 10/10; left medial ankle chronic venous insufficiency. Wound has not changed much in appearance completely nonviable surface. Apparently there have been problems getting the right product on the wound at the facility although he came in with [facility] on today 05/14/2021 upon evaluation today patient appears to be doing well with regard to his wound. I think he is making progress here which is good news. Fortunately there does not appear to be any signs of active infection at this time. No fevers, chills, nausea, vomiting, or diarrhea. 05/20/2021 upon evaluation today patient appears to be doing well with regard to his wound. He is showing signs of good improvement which is great news. There does not appear to be any evidence of active infection which is also excellent news. No fevers, chills, nausea, vomiting, or diarrhea. 05/28/2021 upon evaluation today patient appears to be doing quite well. There does not appear to be any signs of active infection at this time which is great news. Overall I am extremely pleased with where things stand today. I think he is headed in the right direction. 06/11/2021 upon evaluation today patient appears to be doing well with regard to his left ankle ulcer and poorly in regard to the toe ulcer on the second toe right foot. This appears to show signs of joint exposure. Apparently this has been present for 1 to 2 months although he kept forgetting to tell me about it. That is unfortunate as right now it definitely appears to be  doing significantly worse than what I would like to see. There does not appear to be any signs of active infection systemically though locally I am concerned about the possibility of infection the toe is quite red. Again no one from the facility ever contacted [place] to advise that this was going on in the interim either. 06/17/2021 upon evaluation today patient presents for follow-up I did review his x-ray which showed a navicular bone fracture I am unsure of the chronicity of this. Subsequently he also had osteomyelitis of the toe which was what I was more concerned about this did not show up on x-ray but did show up on the pathology scrapings. This was listed as acute osteomyelitis. Nonetheless at this point I think that the antibiotic treatment is the best regimen to go with currently. The patient is in agreement with that plan. Nonetheless he has initially 30 days of doxycycline off likely extend that towards the end of the treatment cycle that will be around the middle of December for an additional 2 weeks. That all depends on how well he continues to heal. Nonetheless based on what I am seeing in the foot I did want a proceed with an MRI as well which I think will be helpful to identify if there is anything else that needs to be addressed from the standpoint of infection. 06/24/2021 upon evaluation today patient appears to be doing pretty well in regards to his wounds. I think both are  actually showing signs of improvement which is good I did review his MRI today which did show signs of osteomyelitis of the middle and proximal phalanx on his right foot of the affected toe. With that being said this is actually showing signs of significant improvement today already with the antibiotic therapy I think the redness is also improved. Overall I think that we just need to give this some time with appropriate wound care we will see how things go potentially hyperbarics could be considered. 07/02/2021 upon  inspection today patient actually appears to be doing well in regard to his left ankle which is getting very close to complete resolution of pleased in that regard. Unfortunately he is continuing to have issues with his second toe right foot and this seems to still be very painful for him. Recommend he try something different from the standpoint of antibiotics. 07/15/2021 upon evaluation today patient appears to be doing actually pretty well in regard to his foot. This is actually showing signs of significant improvement which is great news. Overall I feel like the patient is improving both in regard to the second toe as well as the ankle on the left. With that being said the biggest issue that I do see currently is that he is needing to have a refill of the doxycycline that we previously treated him with. He also did see podiatry they are not going to recommend any amputation at this point since he seems to be doing quite well. For that reason we just need to keep things under control from an infection standpoint. 08/01/2021 upon evaluation today patient appears to be doing well with regard to his wound. He has been tolerating the dressing changes without complication. Fortunately there does not appear to be any evidence of active infection locally nor systemically at this point. In fact I think everything is doing excellent in fact his second toe on the right foot is almost healed and the ankle on the left ankle region is actually very close to being healed as well. 08/08/2021 upon evaluation today patient appears to be doing well with regard to his wound. He has been tolerating the dressing changes without complication. Fortunately I do not see any signs of active infection at this time. Readmission: 12-06-2021 upon evaluation today patient presents for reevaluation here in the clinic he does tell me that he was being seen in facility at [facility] by a provider that was coming in. He is not  sure who this was. He tells me however that the wound seems to have gotten worse even compared to where it was when we last saw him at this point. With that being said I do believe that he is likely going need ongoing wound care here in the clinic and I do believe that we need to be the ones to frontline this since his wound does seem to be getting worse not better at this point. He voiced understanding. He is also in agreement with this plan and feels more comfortable coming here she tells me. Patient's medical history really has not changed since his prior admission he was only gone since January. 12-27-2021 upon evaluation today patient appears to be doing well with regard to his wound they did run out of the [facility] so they did not put anything on just an ABD pad with gentamicin. Still we are seeing some signs of good improvement here with some new epithelization which is great news. 01-10-2022 upon evaluation today patient appears  to be doing well with regard to his wounds and he is going require some sharp debridement but overall seems to be making good progress. Fortunately I do not see any evidence of active infection locally or systemically at this time which is great news. 01-24-2022 upon evaluation today patient appears to be doing well with regard to his wound. The facility actually came and dropped him off early and he had another appointment at the hospital and then they just brought him over here and this was still hours before his appointment this afternoon. For that reason we did do our best to work him in this morning and fortunately had some space to make this happen. With that being said patient's wound does seem to be making progress here and I am very pleased in that regard I do not see any signs of active infection locally or systemically at this time. 02-07-2022 patient appears to be doing well currently in regard to his wounds. In fact one of them the more proximal is healed  the distal is still open but seems to be doing excellent. Fortunately I do not see any evidence of active infection locally or systemically at this time which is great news. No fevers, chills, nausea, vomiting, or diarrhea. 7/27; left medial ankle venous. Improving per our intake nurse. We are using Hydrofera Blue under Tubigrip compression. They are changing that at his facility 03-06-2022 upon evaluation today patient appears to be doing well with regard to the his wound he is can require some sharp debridement but seems to be making excellent progress. Fortunately I do not see any evidence of active infection locally or systemically which is great news. 03-27-2022 upon evaluation today patient's wound is actually showing signs of excellent improvement. Fortunately I see no signs of active infection locally or systemically at this time which is great news. No fevers, chills, nausea, vomiting, or diarrhea. 04-21-2022 upon evaluation today patient appears to be doing excellent in regard to his wound in fact this is very close to resolution based on what I am seeing. I do not see any evidence of active infection locally or systemically at this time which is great news and overall I am extremely pleased with where we are today. 05-05-2022 upon evaluation today patient's wound actually appears to potentially be completely healed. Fortunately I do not see any evidence of active Chad [name], Chad [name] ([id number]) [id number] Page 6 of 17 infection at this time which is great news and overall I am very pleased I think this needs a little time to toughen up but other than that I really do believe were doing quite well. He is very pleased to hear this its been a long time coming. 05-19-2022 upon evaluation today patient appears to be doing well currently in regard to his wound. He in fact we were hoping will be completely healed and closed but it still has a very tiny area right in the  center which is still continuing to drain. Fortunately I do not see any signs of infection locally or systemically which is great news. 11/6; this wound is close to completely" he comes in this week with some erythema at 1 edge of this which looks like something was rubbing on the area. Other than that no evidence of infection 06-16-2022 upon evaluation today patient appears to be doing well currently in regard to his ankle ulcer. This is very close to being healed but still has a small area in the central portion which is not.  Fortunately there does not appear to be any signs of infection locally or systemically which is great news. No fevers, chills, nausea, vomiting, or diarrhea. 06-30-2022 upon evaluation patient's wound pretty much appears to be almost completely closed. In fact it may even be closed but there are still a lot of skin irritation around the edges of the wound and I just do not feel great about releasing them yet I would like to continue to monitor this just a little bit longer before getting to that point. He is not opposed to this and in fact is happy to continue to come in if need be in order to make sure that things are moving in the right direction he definitely does not want to backtrack. 07-14-2022 upon evaluation today patient appears to be doing well currently in regard to his wound. Has been tolerating the dressing changes without complication. Fortunately I see no evidence of active infection locally nor systemically which is great news. No fevers, chills, nausea, vomiting, or diarrhea. 08-11-2022 upon evaluation today patient appears to be doing a little worse than last time I saw him although it has been a month. Unfortunately he was not able to be seen due to the fact that he was quarantined in the facility with COVID. Subsequently he tells me that he mention to the facility staff several times about taking it off over the past several weeks but they continue to tell him  that someone have to get in touch with [place] here although nobody ever did until Friday when the nurse manager called to have that documented in the communication notes. With that being said unfortunately this means that he had a wrap on that he was supposed to have on for only 1 week for ended up being on four 1 month essentially. I am glad that there is nothing worse than what we see currently to be perfectly honest. 08-19-2022 upon evaluation today patient appears to be doing well currently in regard to his wounds. He has been tolerating the dressing changes without complication. This is actually smaller today which is great news after last week's reopening. Fortunately I do not see any evidence of infection locally nor systemically at this point. 08-26-2022 upon evaluation today patient appears to be doing well currently in regard to his wound. He has been tolerating the dressing changes without complication. Fortunately there does not appear to be any signs of active infection locally nor systemically which is great news. No fevers, chills, nausea, vomiting, or diarrhea. 09-02-2022 upon evaluation today patient's wound actually showing signs of excellent improvement this is measuring smaller and looking much better. I am very pleased with where we stand I do believe that we are headed in the right direction. 09-09-2022 upon evaluation today patient appears to be doing decently well in regard to his leg in general although there was some swelling this is the 1 thing that I am not too happy with based on what I see currently. Fortunately there does not appear to be any signs of infection locally nor systemically at this point. 09-16-2022 upon evaluation today patient appears to be doing well currently in regard to his wound. He is actually showing signs of improvement he wore the Tubigrip this week and it significantly better compared to last week's evaluation. Fortunately I do not see any evidence of  active infection locally or systemically which is great news. Overall I think that were headed in the right direction which is great news. In fact  overall I think this is the best that he has looked in some time. He does not like the Tubigrip but actually did extremely well with this in my opinion. I think he needs to continue to utilize this. 09-23-2022 upon evaluation today patient appears to be doing well currently in regard to his wound. He has been tolerating the dressing changes without complication. Fortunately there does not appear to be any signs of active infection locally nor systemically which is great news and in general I do feel like that we are headed in the right direction. He has been having trouble with the Tubigrip however we have gotten the facility to order compression socks he has not gotten them as of yet. 3/4; patient is about the same with a wound on the left posterior leg. This is a venous wound. He needs more compression on this leg but he took off the previous wraps we are using. I am not certain whether he is using juxta lite stockings at the nursing home. 3/12; patient presents for follow-up. He unfortunately has 2 new wounds 1 to the dorsal left side and the other to the right heel. He is not sure how these happened. The right heel appears to be caused by pressure. He has been using Hydrofera Blue to the original wound on the left leg. 10-14-2022 upon evaluation today patient appears to be doing well currently in regard to his original wound but unfortunately he has several new wounds which I was completely unaware of before walking into the room to see him today. Unfortunately he seems to not be doing nearly as well as what I saw when I last had an appointment with him 3 weeks ago. Fortunately I do not see any signs of systemic infection though unfortunately locally he has definite infection of the left lower extremity. He is currently on doxycycline I am going to likely  have to extend that today. 10-21-2022 upon evaluation today patient appears to be doing well currently in regard to his wounds. He seems to be tolerating the dressing changes without complication. Fortunately there does not appear to be any signs of active infection locally or systemically which is great news and in general I definitely feel like we are on the right track here. 10-30-2022 upon evaluation today patient still continues to not be doing too well at all with regard to his wounds. Unfortunately he is still having signs of infection as well which also has been concerned. Fortunately I do not see any signs of active infection locally nor systemically at this time. No fevers, chills, nausea, vomiting, or diarrhea. Electronic Signature(s) Signed: 10/30/2022 9:08:29 AM By: [name] PA-C Entered By: [name] on 10/30/2022 09:08:29 -------------------------------------------------------------------------------- Physical Exam Details Patient Name: Date of Service: Chad [name], Chad [name] 10/30/2022 8:30 A M Medical Record Number: [id number] Patient Account Number: [id number] Date of Birth/Sex: Treating RN: Aug 16, 1960 (62 y.o. Male) Midge Aver Primary Care Provider: [name] Other Clinician: [name] ([id number]) [id number] Page 7 of 17 Referring Provider: Treating Provider/Extender: [name] in Treatment: 53 Constitutional Well-nourished and well-hydrated in no acute distress. Respiratory normal breathing without difficulty. Psychiatric this patient is able to make decisions and demonstrates good insight into disease process. Alert and Oriented x 3. pleasant and cooperative. Notes Upon inspection patient's wounds are showing signs of still doing significantly worse. I am concerned about his arterial flow I think he needs a repeat study with ABI TBI ASAP. Also think that we  are going to do a culture and he is off of his  antibiotics at this point unfortunately he just does not seem to have completely cleared this infection even after 2 rounds of the antibiotic. Electronic Signature(s) Signed: 10/30/2022 9:09:18 AM By: [name] PA-C Entered By: [name] on 10/30/2022 09:09:18 -------------------------------------------------------------------------------- Physician Orders Details Patient Name: Date of Service: Chad [name], Chad [name] 10/30/2022 8:30 A M Medical Record Number: [id number] Patient Account Number: [id number] Date of Birth/Sex: Treating RN: 11-Oct-1960 (62 y.o. 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 Bathing/ Shower/ Hygiene May shower with wound dressing protected with water repellent cover or cast protector. No tub bath. Anesthetic (Use 'Patient Medications' Section for Anesthetic Order Entry) Lidocaine applied to wound bed Edema Control - Lymphedema / Segmental Compressive Device / Other Patient to wear own compression stockings. Remove compression stockings every night before going to bed and put on every morning when getting up. - please order patient compression stockings 8-76mm/hg ankle 23cm, calf 32cm, leg length floor to knee 40cm Elevate legs to the level of the heart and pump ankles as often as possible Elevate leg(s) parallel to  the floor when sitting. Off-Loading Heel suspension boot - when in bed Medications-Please add to medication list. Other: - apply AandD ointment to leg, do not apply to wound Wound Treatment Wound #12 - Ankle Wound Laterality: Left, Medial, Distal Cleanser: Soap and Water Every Other Day/30 Days Discharge Instructions: Gently cleanse wound with antibacterial soap, rinse and pat dry prior to dressing wounds Cleanser: Wound Cleanser Every Other Day/30 Days Discharge Instructions: Wash your hands with soap and water. Remove old dressing, discard into plastic bag and place into trash. Cleanse the KWENTIN, SINAGRA ([id number]) [id number] Page 8 of 17 wound with Wound Cleanser prior to applying a clean dressing using gauze sponges, not tissues or cotton balls. Do not scrub or use excessive force. Pat dry using gauze sponges, not tissue or cotton balls. Peri-Wound Care: AandD Ointment Every Other Day/30 Days Discharge Instructions: Apply AandD Ointment to areas of redness Prim Dressing: Hydrofera Blue Ready Transfer Foam, 4x5 (in/in) Every Other Day/30 Days ary Discharge Instructions: Apply Hydrofera Blue Ready to wound bed as directed Secondary Dressing: ABD Pad 5x9 (in/in) Every Other Day/30 Days Discharge Instructions: Cover with ABD pad Secured With: Kerlix Roll Sterile or Non-Sterile 6-ply 4.5x4 (yd/yd) Every Other Day/30 Days Discharge Instructions: Apply Kerlix as directed Wound #15 - Foot Wound Laterality: Dorsal, Left Cleanser: Soap and Water Every Other Day/30 Days Discharge Instructions: Gently cleanse wound with antibacterial soap, rinse and pat dry prior to dressing wounds Cleanser: Wound Cleanser Every Other Day/30 Days Discharge Instructions: Wash your hands with soap and water. Remove old dressing, discard into plastic bag and place into trash. Cleanse the wound with Wound Cleanser prior to applying a clean dressing using gauze sponges, not tissues or  cotton balls. Do not scrub or use excessive force. Pat dry using gauze sponges, not tissue or cotton balls. Peri-Wound Care: AandD  Ointment Every Other Day/30 Days Discharge Instructions: Apply AandD Ointment to areas of redness Topical: Santyl Collagenase Ointment, 30 (gm), tube Every Other Day/30 Days Discharge Instructions: apply nickel thick to wound bed only Prim Dressing: Gauze Every Other Day/30 Days ary Discharge Instructions: As directed: dry, moistened with saline or moistened with Dakins Solution Secondary Dressing: ABD Pad 5x9 (in/in) Every Other Day/30 Days Discharge Instructions: Cover with ABD pad Secured With: Kerlix Roll Sterile or Non-Sterile 6-ply 4.5x4 (yd/yd) Every Other Day/30 Days Discharge Instructions: Apply Kerlix as directed Wound #16 - Calcaneus Wound Laterality: Right Cleanser: Soap and Water Every Other Day/30 Days Discharge Instructions: Gently cleanse wound with antibacterial soap, rinse and pat dry prior to dressing wounds Cleanser: Wound Cleanser Every Other Day/30 Days Discharge Instructions: Wash your hands with soap and water. Remove old dressing, discard into plastic bag and place into trash. Cleanse the wound with Wound Cleanser prior to applying a clean dressing using gauze sponges, not tissues or cotton balls. Do not scrub or use excessive force. Pat dry using gauze sponges, not tissue or cotton balls. Peri-Wound Care: AandD Ointment Every Other Day/30 Days Discharge Instructions: Apply AandD Ointment to areas of redness Topical: Santyl Collagenase Ointment, 30 (gm), tube Every Other Day/30 Days Discharge Instructions: apply nickel thick to wound bed only Prim Dressing: Gauze Every Other Day/30 Days ary Discharge Instructions: As directed: dry, moistened with saline or moistened with Dakins Solution Secondary Dressing: ABD Pad 5x9 (in/in) Every Other Day/30 Days Discharge Instructions: Cover with ABD pad Secured With: Kerlix Roll Sterile or  Non-Sterile 6-ply 4.5x4 (yd/yd) Every Other Day/30 Days Discharge Instructions: Apply Kerlix as directed Wound #17 - Lower Leg Wound Laterality: Left, Anterior Cleanser: Soap and Water Every Other Day/30 Days Discharge Instructions: Gently cleanse wound with antibacterial soap, rinse and pat dry prior to dressing wounds Cleanser: Wound Cleanser Every Other Day/30 Days Discharge Instructions: Wash your hands with soap and water. Remove old dressing, discard into plastic bag and place into trash. Cleanse the wound with Wound Cleanser prior to applying a clean dressing using gauze sponges, not tissues or cotton balls. Do not scrub or use excessive force. Pat dry using gauze sponges, not tissue or cotton balls. Peri-Wound Care: AandD Ointment Every Other Day/30 Days Discharge Instructions: Apply AandD Ointment to areas of redness Topical: Santyl Collagenase Ointment, 30 (gm), tube Every Other Day/30 Days Discharge Instructions: apply nickel thick to wound bed only [facility], [name] ([id number]) [id number] Page 9 of 17 Prim Dressing: Algicell Calcium Alginate Dressing, 4X5 (in/in) ary Every Other Day/30 Days Secondary Dressing: ABD Pad 5x9 (in/in) Every Other Day/30 Days Discharge Instructions: Cover with ABD pad Secured With: Kerlix Roll Sterile or Non-Sterile 6-ply 4.5x4 (yd/yd) Every Other Day/30 Days Discharge Instructions: Apply Kerlix as directed Wound #18 - T Second oe Wound Laterality: Right, Medial Cleanser: Soap and Water Every Other Day/30 Days Discharge Instructions: Gently cleanse wound with antibacterial soap, rinse and pat dry prior to dressing wounds Cleanser: Wound Cleanser Every Other Day/30 Days Discharge Instructions: Wash your hands with soap and water. Remove old dressing, discard into plastic bag and place into trash. Cleanse the wound with Wound Cleanser prior to applying a clean dressing using gauze sponges, not tissues or cotton balls. Do not  scrub or use excessive force. Pat dry using gauze sponges, not tissue or cotton balls. Peri-Wound Care: AandD Ointment Every Other Day/30 Days Discharge Instructions: Apply AandD Ointment to areas of redness Topical: Santyl Collagenase Ointment, 30 (gm), tube Every Other Day/30 Days Discharge  Instructions: apply nickel thick to wound bed only Prim Dressing: Gauze Every Other Day/30 Days ary Discharge Instructions: As directed: dry, moistened with saline or moistened with Dakins Solution Secondary Dressing: ABD Pad 5x9 (in/in) Every Other Day/30 Days Discharge Instructions: Cover with ABD pad Secured With: Kerlix Roll Sterile or Non-Sterile 6-ply 4.5x4 (yd/yd) Every Other Day/30 Days Discharge Instructions: Apply Kerlix as directed Consults Vascular - Patient needs a referral for further evaluation of his abnormal arterial studies that were performed in facility. He had an ABI on the left and 0.59 he has significant wounds on the left and I think this is the reason why I think he is going require revascularization I do want him to be seen by vascular as soon as we can. - (ICD10 L97.322 - Non-pressure chronic ulcer of left ankle with fat layer exposed) Laboratory Bacteria identified in Wound by Culture (MICRO) LOINC Code: [phone] Convenience Name: Wound culture routine Electronic Signature(s) Signed: 10/31/2022 10:47:27 AM By: [name] PA-C Previous Signature: 10/30/2022 4:51:01 PM Version By: Midge Aver MSN RN CNS WTA Previous Signature: 10/31/2022 8:47:01 AM Version By: [name] PA-C Entered By: [name] on 10/31/2022 10:47:26 Prescription 10/30/2022 -------------------------------------------------------------------------------- Melvia Heaps PA-C Patient Name: Provider: 1960-09-05 [id number] Date of Birth: NPI#: Male [id number] Sex: DEA #: [phone] Phone #: License #: [facility] Patient  Address: [address] [facility] [place], [place] [address] [address] [place], [place] [address] [phone] Allergies penicillin Provider's Orders Vascular - ICD10: V42.595 - Patient needs a referral for further evaluation of his abnormal arterial studies that were performed in facility. He had an ABI on the left and 0.59 he has significant wounds on the left and I think this is the reason why I think he is going require revascularization I do want him MANNY, VITOLO ([id number]) [id number] Page 10 of 17 to be seen by vascular as soon as we can. Hand Signature: Date(s): Electronic Signature(s) Signed: 10/31/2022 12:23:32 PM By: [name] PA-C Entered By: [name] on 10/31/2022 10:47:27 -------------------------------------------------------------------------------- Problem List Details Patient Name: Date of Service: Chad [name] 10/30/2022 8:30 A M Medical Record Number: [id number] Patient Account Number: [id number] Date of Birth/Sex: Treating RN: 1961-02-04 (62 y.o. 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 Male) Midge Aver Primary Care Provider: [name] Other Clinician: Referring Provider: Treating Provider/Extender: [name] in Treatment: [name] Subjective Chief Complaint [name] ([id number]) [id number] Page 11 of 17 Information obtained from Patient Left ankle ulcer, Left dorsal foot wound, right heel History  of Present Illness (HPI) 10/08/18 on evaluation today patient actually presents to our office for initial evaluation concerning wounds that he has of the bilateral lower extremities. 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 He also has bilateral venous stasis. He apparently has been homeless currently following discharge from the hospital apparently he has been placed at [facility] which is is a skilled nursing facility locally. Nonetheless fortunately he does not show any signs of infection at this time which is good news. In fact several of the wound actually appears to be showing some signs of improvement already in my pinion. There are a couple areas in the left leg in particular there likely gonna require some sharp debridement to help clear away some necrotic tissue and help with more sufficient healing. No fevers, chills, nausea, or vomiting noted at this time. 10/15/18 on evaluation today patient actually appears to be doing very well in regard to his bilateral lower extremities. He's been tolerating the dressing  changes without complication. Fortunately there does not appear to be any evidence of active infection at this time which is great news. Overall I'm actually very pleased with how this has progressed in just one visits time. Readmission: 08/14/2020 upon evaluation today patient presents for re-evaluation here in our clinic. He is having issues with his left ankle region as well as his right toe and his right heel. He tells me that the toe and heel actually began as a area that was itching that he was scratching and then subsequently opened up into wounds. These may have been abscess areas I presume based on what I am seeing currently. With regard to his left ankle region he tells me this was a similar type occurrence although he does have venous stasis this very well may be more of a venous leg ulcer more than anything. Nonetheless I do believe that the patient would benefit from appropriate and aggressive wound care to try to help get things under better control here. He does have history of a stroke on the left side affecting him to some degree there that he is able to stand although he does have some residual weakness. Otherwise again the patient does have chronic venous insufficiency as previously noted. His arterial studies most recently obtained showed that he had an ABI on the right of 1.16 with a TBI of 0.52 and on the left and ABI of 1.14 with a TBI of 0.81. That was obtained on 06/19/2020. 08/28/2020 upon evaluation today patient appears to be doing decently well in regard to his wounds in general. He has been tolerating the dressing changes without complication. Fortunately there does not appear to be any signs of active infection which is great news. With that being said I think the [facility] is doing a good job I would recommend that we likely continue with that currently. 09/11/2020 upon evaluation today patient's wounds did not appear to be doing too poorly but again he is not really  showing signs of significant improvement with regard to any of the wounds on the right. None of them have Hydrofera Blue on them I am not exactly sure why this is not being followed as the facility did not contact us to let us know of any issues with obtaining dressings or otherwise. With that being said he is supposed to be using  Hydrofera Blue on both of the wounds on the right foot as well as the ankle wound on the left side. 09/18/2020 upon evaluation today patient appears to be doing poorly with regard to his wounds. Again right now the left ankle in particular showed signs of extreme maceration. Apparently he was told by someone with staff at [facility] they could not get the [facility]. With that being said this is something that is never been relayed to [place] one way or another. Also the patient subsequently has not supposed to have a border gauze dressing on. He should have an ABD pad and roll gauze to secure as this drains much too much just to have a border gauze dressing to cover. Nonetheless the fact that they are not using the appropriate dressing is directly causing deterioration of the left ankle wound it is significantly worse today compared to what it was previous. I did attempt to call [facility] while the patient was here I called three times and got no one to even pick up the phone. After this I had my for an office coordinator call and she was able to finally get through and leave a message with the D ON as of dictation of this note which is roughly about an hour and a half later I still have not been able to speak with anyone at the facility. 09/25/2020 upon evaluation today patient actually showing signs of good improvement which is excellent news. He has been tolerating the dressing changes without complication. Fortunately there is no signs of active infection which is great news. No fevers, chills, nausea, vomiting, or diarrhea. I do feel like the facility has  been doing a much better job at taking care of him as far as the dressings are concerned. However the director of nursing never did call me back. 10/09/2020 upon evaluation today patient appears to be doing well with regard to his wound. The toe ulcer did require some debridement but the other 2 areas actually appear to be doing quite well. 10/19/2020 upon evaluation today patient actually appears to be doing very well in regard to his wounds. In fact the heel does appear to be completely healed. The toe is doing better in the medial ankle on the left is also doing better. Overall I think he is headed in the right direction. 10/26/2020 upon evaluation today patient appears to be doing well with regard to his wound. He is showing signs of improvement which is great news and overall I am very pleased with where things stand today. No fevers, chills, nausea, vomiting, or diarrhea. 11/02/2020 upon evaluation today patient appears to be doing well with regard to his wounds. He has been tolerating the dressing changes without complication overall I am extremely pleased with where things stand today. He in regard to the toe is almost completely healed and the medial ankle on the left is doing much better. 11/09/2020 upon evaluation today patient appears to be doing a little poorly in regard to his left medial ankle ulcer. Fortunately there does not appear to be any signs of systemic infection but unfortunately locally he does appear to be infected in fact he has blue-green drainage consistent with Pseudomonas. 11/16/2020 upon evaluation today patient appears to be doing well with regard to his wound. It actually appears to be doing better. I did place him on gentamicin cream since the Cipro was actually resistant even though he was positive for Pseudomonas on culture. Overall I think that he  does seem to be doing better though I am unsure whether or not they have actually been putting the cream on. The patient is not  sure that we did talk to the nurse directly and she was going to initiate that treatment. Fortunately there does not appear to be any signs of active infection at this time. No fevers, chills, nausea, vomiting, or diarrhea. 4/28; the area on the right second toe is close to healed. Left medial ankle required debridement 12/07/2020 upon evaluation today patient appears to be doing well with regard to his wounds. In fact the right second toe appears to be completely healed which is great news. Fortunately there does not appear to be any signs of active infection at this time which is also great news. I think we can probably discontinue the gentamicin on top of everything else. 12/14/2020 upon evaluation today patient appears to be doing well with regard to his wound. He is making good progress and overall very pleased with where things stand today. There is no signs of active infection at this time which is great news. 12/28/2020 upon evaluation today patient appears to be doing well with regard to his wounds. He has been tolerating the dressing changes without complication. Fortunately there is no signs of active infection at this time. No fevers, chills, nausea, vomiting, or diarrhea. 12/28/2020 upon evaluation today patient's wound bed actually showed signs of excellent improvement. He has great epithelization and granulation I do not see any signs of infection overall I am extremely pleased with where things stand at this point. No fevers, chills, nausea, vomiting, or diarrhea. 01/11/2021 upon evaluation today patient appears to be doing well with regard to his wound on his leg. He has been tolerating the dressing changes without complication. Fortunately there does not appear to be any signs of active infection which is great news. No fevers, chills, nausea, vomiting, or diarrhea. 01/25/2021 upon evaluation today patient appears to be doing well with regard to his wound. He has been tolerating the dressing  changes without complication. Fortunately the collagen seems to be doing a great job which is excellent news. No fevers, chills, nausea, vomiting, or diarrhea. STALEY, LUNZ ([id number]) [id number] Page 12 of 17 02/08/2021 upon evaluation today patient's wound is actually looking a little bit worse especially in the periwound compared to previous. Fortunately there does not appear to be any signs of infection which is great news with that being said he does have some irritation around the periphery of the wound which has me more concerned. He actually had a dressing on that had not been changed in 3 days. He also is supposed to have daily dressing changes. With regard to the dressing applied he had a silver alginate dressing and silver collagen is what is recommended and ordered. He also had no Desitin around the edges of the wound in the periwound region although that is on the order inspect to be done as well. In general I was very concerned I did contact [facility] actually spoke with Verlon Au who is the scheduling individual and subsequently she stated that she would pass the information to the D ON apparently the D ON was not available to talk to me when I call today. 02/18/2021 upon evaluation today patient's wound is actually showing signs of improvement. Fortunately there does not appear to be any evidence of infection which is great news overall I am extremely pleased with where things stand today. No fevers, chills, nausea, vomiting, or diarrhea. 8/3;  patient presents for 1 week follow-up. He has no issues or complaints today. He denies signs of infection. 03/11/2021 upon evaluation today patient appears to be doing well with regard to his wound. He does have a little bit of slough noted on the surface of the wound but fortunately there does not appear to be any signs of active infection at this time. No fevers, chills, nausea, vomiting, or  diarrhea. 03/18/2021 upon evaluation today patient appears to be doing well with regard to his wound. He has been tolerating the dressing changes without complication. There was a little irritation more proximal to where the wound was that was not noted last week but nonetheless this is very superficial just seems to be more irritation we just need to make sure to put a good amount of the zinc over the area in my opinion. Otherwise he does not seem to be doing significantly worse at all which is great news. 03/25/2021 upon evaluation today patient appears to be doing well with regard to his wound. He is going require some sharp debridement today to clear with some of the necrotic debris. I did perform this today without complication postdebridement wound bed appears to be doing much better this is great news. 04/08/2021 upon evaluation today patient appears to be doing decently well in regard to his wound although the overall measurement is not significantly smaller compared to previous. It is gone down a little bit but still the facility continues to not really put the appropriate dressings in place in fact he was supposed to have collagen we think he probably had more of an allergy to At this point. Fortunately there does not appear to be any signs of active infection systemically though locally I do not see anything on initial visualization either as far as erythema or warmth. 04/15/2021 upon evaluation today patient appears to be doing well with regard to his wound. He is actually showing signs of improvement. I did place him on antibiotics last week, Cipro. He has been taking that 2 times a day and seems to be tolerating it very well. I do not see any evidence of worsening and in fact the overall appearance of the wound is smaller today which is also great news. 9/26; left medial ankle chronic venous insufficiency wound is improved. Using Hydrofera Blue 10/10; left medial ankle chronic venous  insufficiency. Wound has not changed much in appearance completely nonviable surface. Apparently there have been problems getting the right product on the wound at the facility although he came in with [facility] on today 05/14/2021 upon evaluation today patient appears to be doing well with regard to his wound. I think he is making progress here which is good news. Fortunately there does not appear to be any signs of active infection at this time. No fevers, chills, nausea, vomiting, or diarrhea. 05/20/2021 upon evaluation today patient appears to be doing well with regard to his wound. He is showing signs of good improvement which is great news. There does not appear to be any evidence of active infection which is also excellent news. No fevers, chills, nausea, vomiting, or diarrhea. 05/28/2021 upon evaluation today patient appears to be doing quite well. There does not appear to be any signs of active infection at this time which is great news. Overall I am extremely pleased with where things stand today. I think he is headed in the right direction. 06/11/2021 upon evaluation today patient appears to be doing well with regard to his left  ankle ulcer and poorly in regard to the toe ulcer on the second toe right foot. This appears to show signs of joint exposure. Apparently this has been present for 1 to 2 months although he kept forgetting to tell me about it. That is unfortunate as right now it definitely appears to be doing significantly worse than what I would like to see. There does not appear to be any signs of active infection systemically though locally I am concerned about the possibility of infection the toe is quite red. Again no one from the facility ever contacted [place] to advise that this was going on in the interim either. 06/17/2021 upon evaluation today patient presents for follow-up I did review his x-ray which showed a navicular bone fracture I am unsure of the chronicity of this.  Subsequently he also had osteomyelitis of the toe which was what I was more concerned about this did not show up on x-ray but did show up on the pathology scrapings. This was listed as acute osteomyelitis. Nonetheless at this point I think that the antibiotic treatment is the best regimen to go with currently. The patient is in agreement with that plan. Nonetheless he has initially 30 days of doxycycline off likely extend that towards the end of the treatment cycle that will be around the middle of December for an additional 2 weeks. That all depends on how well he continues to heal. Nonetheless based on what I am seeing in the foot I did want a proceed with an MRI as well which I think will be helpful to identify if there is anything else that needs to be addressed from the standpoint of infection. 06/24/2021 upon evaluation today patient appears to be doing pretty well in regards to his wounds. I think both are actually showing signs of improvement which is good I did review his MRI today which did show signs of osteomyelitis of the middle and proximal phalanx on his right foot of the affected toe. With that being said this is actually showing signs of significant improvement today already with the antibiotic therapy I think the redness is also improved. Overall I think that we just need to give this some time with appropriate wound care we will see how things go potentially hyperbarics could be considered. 07/02/2021 upon inspection today patient actually appears to be doing well in regard to his left ankle which is getting very close to complete resolution of pleased in that regard. Unfortunately he is continuing to have issues with his second toe right foot and this seems to still be very painful for him. Recommend he try something different from the standpoint of antibiotics. 07/15/2021 upon evaluation today patient appears to be doing actually pretty well in regard to his foot. This is actually  showing signs of significant improvement which is great news. Overall I feel like the patient is improving both in regard to the second toe as well as the ankle on the left. With that being said the biggest issue that I do see currently is that he is needing to have a refill of the doxycycline that we previously treated him with. He also did see podiatry they are not going to recommend any amputation at this point since he seems to be doing quite well. For that reason we just need to keep things under control from an infection standpoint. 08/01/2021 upon evaluation today patient appears to be doing well with regard to his wound. He has been tolerating the dressing changes  without complication. Fortunately there does not appear to be any evidence of active infection locally nor systemically at this point. In fact I think everything is doing excellent in fact his second toe on the right foot is almost healed and the ankle on the left ankle region is actually very close to being healed as well. 08/08/2021 upon evaluation today patient appears to be doing well with regard to his wound. He has been tolerating the dressing changes without complication. Fortunately I do not see any signs of active infection at this time. Readmission: 12-06-2021 upon evaluation today patient presents for reevaluation here in the clinic he does tell me that he was being seen in facility at [facility] by a provider that was coming in. He is not sure who this was. He tells me however that the wound seems to have gotten worse even compared to where it was when we last saw him at this point. With that being said I do believe that he is likely going need ongoing wound care here in the clinic and I do believe that we need to be the ones to frontline this since his wound does seem to be getting worse not better at this point. He voiced understanding. He is also in agreement with this plan and feels more comfortable coming here  she tells me. Patient's medical history really has not changed since his prior admission he was only gone since January. Chad [name], Chad [name] ([id number]) [id number] Page 13 of 17 12-27-2021 upon evaluation today patient appears to be doing well with regard to his wound they did run out of the [facility] so they did not put anything on just an ABD pad with gentamicin. Still we are seeing some signs of good improvement here with some new epithelization which is great news. 01-10-2022 upon evaluation today patient appears to be doing well with regard to his wounds and he is going require some sharp debridement but overall seems to be making good progress. Fortunately I do not see any evidence of active infection locally or systemically at this time which is great news. 01-24-2022 upon evaluation today patient appears to be doing well with regard to his wound. The facility actually came and dropped him off early and he had another appointment at the hospital and then they just brought him over here and this was still hours before his appointment this afternoon. For that reason we did do our best to work him in this morning and fortunately had some space to make this happen. With that being said patient's wound does seem to be making progress here and I am very pleased in that regard I do not see any signs of active infection locally or systemically at this time. 02-07-2022 patient appears to be doing well currently in regard to his wounds. In fact one of them the more proximal is healed the distal is still open but seems to be doing excellent. Fortunately I do not see any evidence of active infection locally or systemically at this time which is great news. No fevers, chills, nausea, vomiting, or diarrhea. 7/27; left medial ankle venous. Improving per our intake nurse. We are using Hydrofera Blue under Tubigrip compression. They are changing that at his facility 03-06-2022 upon  evaluation today patient appears to be doing well with regard to the his wound he is can require some sharp debridement but seems to be making excellent progress. Fortunately I do not see any evidence of active infection locally or systemically which  is great news. 03-27-2022 upon evaluation today patient's wound is actually showing signs of excellent improvement. Fortunately I see no signs of active infection locally or systemically at this time which is great news. No fevers, chills, nausea, vomiting, or diarrhea. 04-21-2022 upon evaluation today patient appears to be doing excellent in regard to his wound in fact this is very close to resolution based on what I am seeing. I do not see any evidence of active infection locally or systemically at this time which is great news and overall I am extremely pleased with where we are today. 05-05-2022 upon evaluation today patient's wound actually appears to potentially be completely healed. Fortunately I do not see any evidence of active infection at this time which is great news and overall I am very pleased I think this needs a little time to toughen up but other than that I really do believe were doing quite well. He is very pleased to hear this its been a long time coming. 05-19-2022 upon evaluation today patient appears to be doing well currently in regard to his wound. He in fact we were hoping will be completely healed and closed but it still has a very tiny area right in the center which is still continuing to drain. Fortunately I do not see any signs of infection locally or systemically which is great news. 11/6; this wound is close to completely" he comes in this week with some erythema at 1 edge of this which looks like something was rubbing on the area. Other than that no evidence of infection 06-16-2022 upon evaluation today patient appears to be doing well currently in regard to his ankle ulcer. This is very close to being healed but still has a  small area in the central portion which is not. Fortunately there does not appear to be any signs of infection locally or systemically which is great news. No fevers, chills, nausea, vomiting, or diarrhea. 06-30-2022 upon evaluation patient's wound pretty much appears to be almost completely closed. In fact it may even be closed but there are still a lot of skin irritation around the edges of the wound and I just do not feel great about releasing them yet I would like to continue to monitor this just a little bit longer before getting to that point. He is not opposed to this and in fact is happy to continue to come in if need be in order to make sure that things are moving in the right direction he definitely does not want to backtrack. 07-14-2022 upon evaluation today patient appears to be doing well currently in regard to his wound. Has been tolerating the dressing changes without complication. Fortunately I see no evidence of active infection locally nor systemically which is great news. No fevers, chills, nausea, vomiting, or diarrhea. 08-11-2022 upon evaluation today patient appears to be doing a little worse than last time I saw him although it has been a month. Unfortunately he was not able to be seen due to the fact that he was quarantined in the facility with COVID. Subsequently he tells me that he mention to the facility staff several times about taking it off over the past several weeks but they continue to tell him that someone have to get in touch with [place] here although nobody ever did until Friday when the nurse manager called to have that documented in the communication notes. With that being said unfortunately this means that he had a wrap on that he was supposed  to have on for only 1 week for ended up being on four 1 month essentially. I am glad that there is nothing worse than what we see currently to be perfectly honest. 08-19-2022 upon evaluation today patient appears to be doing well  currently in regard to his wounds. He has been tolerating the dressing changes without complication. This is actually smaller today which is great news after last week's reopening. Fortunately I do not see any evidence of infection locally nor systemically at this point. 08-26-2022 upon evaluation today patient appears to be doing well currently in regard to his wound. He has been tolerating the dressing changes without complication. Fortunately there does not appear to be any signs of active infection locally nor systemically which is great news. No fevers, chills, nausea, vomiting, or diarrhea. 09-02-2022 upon evaluation today patient's wound actually showing signs of excellent improvement this is measuring smaller and looking much better. I am very pleased with where we stand I do believe that we are headed in the right direction. 09-09-2022 upon evaluation today patient appears to be doing decently well in regard to his leg in general although there was some swelling this is the 1 thing that I am not too happy with based on what I see currently. Fortunately there does not appear to be any signs of infection locally nor systemically at this point. 09-16-2022 upon evaluation today patient appears to be doing well currently in regard to his wound. He is actually showing signs of improvement he wore the Tubigrip this week and it significantly better compared to last week's evaluation. Fortunately I do not see any evidence of active infection locally or systemically which is great news. Overall I think that were headed in the right direction which is great news. In fact overall I think this is the best that he has looked in some time. He does not like the Tubigrip but actually did extremely well with this in my opinion. I think he needs to continue to utilize this. 09-23-2022 upon evaluation today patient appears to be doing well currently in regard to his wound. He has been tolerating the dressing changes  without complication. Fortunately there does not appear to be any signs of active infection locally nor systemically which is great news and in general I do feel like that we are headed in the right direction. He has been having trouble with the Tubigrip however we have gotten the facility to order compression socks he has not gotten them as of yet. 3/4; patient is about the same with a wound on the left posterior leg. This is a venous wound. He needs more compression on this leg but he took off the previous wraps we are using. I am not certain whether he is using juxta lite stockings at the nursing home. 3/12; patient presents for follow-up. He unfortunately has 2 new wounds 1 to the dorsal left side and the other to the right heel. He is not sure how these happened. The right heel appears to be caused by pressure. He has been using Hydrofera Blue to the original wound on the left leg. 10-14-2022 upon evaluation today patient appears to be doing well currently in regard to his original wound but unfortunately he has several new wounds which I was completely unaware of before walking into the room to see him today. Unfortunately he seems to not be doing nearly as well as what I saw when I last had [facility], [name] ([id number]) [id number] Page 14 of  17 an appointment with him 3 weeks ago. Fortunately I do not see any signs of systemic infection though unfortunately locally he has definite infection of the left lower extremity. He is currently on doxycycline I am going to likely have to extend that today. 10-21-2022 upon evaluation today patient appears to be doing well currently in regard to his wounds. He seems to be tolerating the dressing changes without complication. Fortunately there does not appear to be any signs of active infection locally or systemically which is great news and in general I definitely feel like we are on the right track here. 10-30-2022 upon  evaluation today patient still continues to not be doing too well at all with regard to his wounds. Unfortunately he is still having signs of infection as well which also has been concerned. Fortunately I do not see any signs of active infection locally nor systemically at this time. No fevers, chills, nausea, vomiting, or diarrhea. Objective Constitutional Well-nourished and well-hydrated in no acute distress. Vitals Time Taken: 8:18 AM, Height: 69 in, Weight: 150 lbs, BMI: 22.1, Temperature: 98.2 F, Pulse: 88 bpm, Respiratory Rate: 16 breaths/min, Blood Pressure: 132/85 mmHg. Respiratory normal breathing without difficulty. Psychiatric this patient is able to make decisions and demonstrates good insight into disease process. Alert and Oriented x 3. pleasant and cooperative. General Notes: Upon inspection patient's wounds are showing signs of still doing significantly worse. I am concerned about his arterial flow I think he needs a repeat study with ABI TBI ASAP. Also think that we are going to do a culture and he is off of his antibiotics at this point unfortunately he just does not seem to have completely cleared this infection even after 2 rounds of the antibiotic. Integumentary (Hair, Skin) Wound #12 status is Open. Original cause of wound was Gradually Appeared. The date acquired was: 07/12/2019. The wound has been in treatment 46 weeks. The wound is located on the Left,Distal,Medial Ankle. The wound measures 1.5cm length x 0.8cm width x 0.1cm depth; 0.942cm^2 area and 0.094cm^3 volume. There is Fat Layer (Subcutaneous Tissue) exposed. There is a medium amount of serosanguineous drainage noted. The wound margin is distinct with the outline attached to the wound base. There is large (67-100%) pink granulation within the wound bed. There is a small (1-33%) amount of necrotic tissue within the wound bed including Adherent Slough. Wound #15 status is Open. Original cause of wound was  Gradually Appeared. The date acquired was: 10/01/2022. The wound has been in treatment 3 weeks. The wound is located on the Left,Dorsal Foot. The wound measures 2.5cm length x 1.5cm width x 0.1cm depth; 2.945cm^2 area and 0.295cm^3 volume. There is a medium amount of serosanguineous drainage noted. There is no granulation within the wound bed. There is a large (67-100%) amount of necrotic tissue within the wound bed including Adherent Slough. Wound #16 status is Open. Original cause of wound was Gradually Appeared. The date acquired was: 09/26/2022. The wound has been in treatment 3 weeks. The wound is located on the Right Calcaneus. The wound measures 1cm length x 1cm width x 0.1cm depth; 0.785cm^2 area and 0.079cm^3 volume. There is Fat Layer (Subcutaneous Tissue) exposed. There is a medium amount of serosanguineous drainage noted. There is medium (34-66%) red granulation within the wound bed. There is a medium (34-66%) amount of necrotic tissue within the wound bed including Adherent Slough. Wound #17 status is Open. Original cause of wound was Pressure Injury. The date acquired was: 09/30/2022. The wound has been  in treatment 2 weeks. The wound is located on the Left,Anterior Lower Leg. The wound measures 3cm length x 2.3cm width x 0.3cm depth; 5.419cm^2 area and 1.626cm^3 volume. There is a medium amount of serosanguineous drainage noted. There is small (1-33%) red granulation within the wound bed. There is a medium (34-66%) amount of necrotic tissue within the wound bed including Eschar. Wound #18 status is Open. Original cause of wound was Pressure Injury. The date acquired was: 09/26/2022. The wound has been in treatment 2 weeks. The wound is located on the Right,Medial T Second. The wound measures 0.5cm length x 0.6cm width x 0.1cm depth; 0.236cm^2 area and 0.024cm^3 volume. oe There is a medium amount of serosanguineous drainage noted. There is small (1-33%) red, pink granulation within the wound  bed. There is a medium (34-66%) amount of necrotic tissue within the wound bed including Adherent Slough. 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 Chad [name], Chad [name] ([id number]) [id number] Page 15 of 17 There was a Chemical/Enzymatic/Mechanical debridement performed by Nelida Meuse., PA-C. With the following instrument(s): gauze. Agent used was [employer]. A time out was conducted at 09:10, prior to the start of the procedure. There was no bleeding. The procedure was tolerated well. Post Debridement Measurements: 2.5cm length x 1.5cm width x 0.1cm depth; 0.295cm^3 volume. Character of Wound/Ulcer Post Debridement is stable. Severity of Tissue Post Debridement is: Fat layer exposed. Post procedure Diagnosis Wound #15: Same as Pre-Procedure Wound #16 Pre-procedure diagnosis of Wound #16 is a Pressure Ulcer located on the Right Calcaneus . There was a Chemical/Enzymatic/Mechanical debridement performed by Nelida Meuse., PA-C. With the following instrument(s): gauze. Agent used was [employer]. A time out was conducted at 09:10, prior to the start of the procedure. There was no bleeding. The procedure was tolerated well. Post Debridement Measurements: 1cm length x 1cm width x 0.1cm depth; 0.079cm^3 volume. Post debridement Stage noted as Category/Stage III. Character of Wound/Ulcer Post Debridement is stable. Post procedure Diagnosis Wound #16: Same as  Pre-Procedure Wound #17 Pre-procedure diagnosis of Wound #17 is a Pressure Ulcer located on the Left,Anterior Lower Leg . There was a Chemical/Enzymatic/Mechanical debridement performed by Nelida Meuse., PA-C. With the following instrument(s): gauze. Agent used was [employer]. A time out was conducted at 09:10, prior to the start of the procedure. There was no bleeding. The procedure was tolerated well. Post Debridement Measurements: 3cm length x 2.3cm width x 0.3cm depth; 1.626cm^3 volume. Post debridement Stage noted as Category/Stage II. Character of Wound/Ulcer Post Debridement is stable. Post procedure Diagnosis Wound #17: Same as Pre-Procedure Wound #18 Pre-procedure diagnosis of Wound #18 is a Pressure Ulcer located on the Right,Medial T Second . There was a Chemical/Enzymatic/Mechanical debridement oe performed by Nelida Meuse., PA-C. With the following instrument(s): gauze. Agent used was [employer]. A time out was conducted at 09:10, prior to the start of the procedure. There was no bleeding. The procedure was tolerated well. Post Debridement Measurements: 0.5cm length x 0.6cm width x 0.1cm depth; 0.024cm^3 volume. Post debridement Stage noted as Category/Stage II. Character of Wound/Ulcer Post Debridement  is stable. Post procedure Diagnosis Wound #18: Same as Pre-Procedure Plan Follow-up Appointments: Return Appointment in 1 week. Bathing/ Shower/ Hygiene: May shower with wound dressing protected with water repellent cover or cast protector. No tub bath. Anesthetic (Use 'Patient Medications' Section for Anesthetic Order Entry): Lidocaine applied to wound bed Edema Control - Lymphedema / Segmental Compressive Device / Other: Patient to wear own compression stockings. Remove compression stockings every night before going to bed and put on every morning when getting up. - please order patient compression stockings 8-22mm/hg ankle 23cm, calf 32cm, leg length floor to knee 40cm Elevate  legs to the level of the heart and pump ankles as often as possible Elevate leg(s) parallel to the floor when sitting. Off-Loading: Heel suspension boot - when in bed Medications-Please add to medication list.: Other: - apply AandD ointment to leg, do not apply to wound Laboratory ordered were: Wound culture routine Consults ordered were: Vascular - Patient needs a referral for further evaluation of his abnormal arterial studies that were performed in facility. He had an ABI on the left and 0.59 he has significant wounds on the left and I think this is the reason why I think he is going require revascularization I do want him to be seen by vascular as soon as we can. WOUND #12: - Ankle Wound Laterality: Left, Medial, Distal Cleanser: Soap and Water Every Other Day/30 Days Discharge Instructions: Gently cleanse wound with antibacterial soap, rinse and pat dry prior to dressing wounds Cleanser: Wound Cleanser Every Other Day/30 Days Discharge Instructions: Wash your hands with soap and water. Remove old dressing, discard into plastic bag and place into trash. Cleanse the wound with Wound Cleanser prior to applying a clean dressing using gauze sponges, not tissues or cotton balls. Do not scrub or use excessive force. Pat dry using gauze sponges, not tissue or cotton balls. Peri-Wound Care: AandD Ointment Every Other Day/30 Days Discharge Instructions: Apply AandD Ointment to areas of redness Prim Dressing: Hydrofera Blue Ready Transfer Foam, 4x5 (in/in) Every Other Day/30 Days ary Discharge Instructions: Apply Hydrofera Blue Ready to wound bed as directed Secondary Dressing: ABD Pad 5x9 (in/in) Every Other Day/30 Days Discharge Instructions: Cover with ABD pad Secured With: Kerlix Roll Sterile or Non-Sterile 6-ply 4.5x4 (yd/yd) Every Other Day/30 Days Discharge Instructions: Apply Kerlix as directed WOUND #15: - Foot Wound Laterality: Dorsal, Left Cleanser: Soap and Water Every Other Day/30  Days Discharge Instructions: Gently cleanse wound with antibacterial soap, rinse and pat dry prior to dressing wounds Cleanser: Wound Cleanser Every Other Day/30 Days Discharge Instructions: Wash your hands with soap and water. Remove old dressing, discard into plastic bag and place into trash. Cleanse the wound with Wound Cleanser prior to applying a clean dressing using gauze sponges, not tissues or cotton balls. Do not scrub or use excessive force. Pat dry using gauze sponges, not tissue or cotton balls. Peri-Wound Care: AandD Ointment Every Other Day/30 Days Discharge Instructions: Apply AandD Ointment to areas of redness Topical: Santyl Collagenase Ointment, 30 (gm), tube Every Other Day/30 Days Discharge Instructions: apply nickel thick to wound bed only Prim Dressing: Gauze Every Other Day/30 Days ary Discharge Instructions: As directed: dry, moistened with saline or moistened with Dakins Solution Secondary Dressing: ABD Pad 5x9 (in/in) Every Other Day/30 Days Chad [name], Chad [name] ([id number]) [id number] Page 16 of 17 Discharge Instructions: Cover with ABD pad Secured With: Kerlix Roll Sterile or Non-Sterile 6-ply 4.5x4 (yd/yd) Every Other Day/30 Days Discharge Instructions: Apply Kerlix as directed WOUND #16: -  Calcaneus Wound Laterality: Right Cleanser: Soap and Water Every Other Day/30 Days Discharge Instructions: Gently cleanse wound with antibacterial soap, rinse and pat dry prior to dressing wounds Cleanser: Wound Cleanser Every Other Day/30 Days Discharge Instructions: Wash your hands with soap and water. Remove old dressing, discard into plastic bag and place into trash. Cleanse the wound with Wound Cleanser prior to applying a clean dressing using gauze sponges, not tissues or cotton balls. Do not scrub or use excessive force. Pat dry using gauze sponges, not tissue or cotton balls. Peri-Wound Care: AandD Ointment Every Other Day/30 Days Discharge  Instructions: Apply AandD Ointment to areas of redness Topical: Santyl Collagenase Ointment, 30 (gm), tube Every Other Day/30 Days Discharge Instructions: apply nickel thick to wound bed only Prim Dressing: Gauze Every Other Day/30 Days ary Discharge Instructions: As directed: dry, moistened with saline or moistened with Dakins Solution Secondary Dressing: ABD Pad 5x9 (in/in) Every Other Day/30 Days Discharge Instructions: Cover with ABD pad Secured With: Kerlix Roll Sterile or Non-Sterile 6-ply 4.5x4 (yd/yd) Every Other Day/30 Days Discharge Instructions: Apply Kerlix as directed WOUND #17: - Lower Leg Wound Laterality: Left, Anterior Cleanser: Soap and Water Every Other Day/30 Days Discharge Instructions: Gently cleanse wound with antibacterial soap, rinse and pat dry prior to dressing wounds Cleanser: Wound Cleanser Every Other Day/30 Days Discharge Instructions: Wash your hands with soap and water. Remove old dressing, discard into plastic bag and place into trash. Cleanse the wound with Wound Cleanser prior to applying a clean dressing using gauze sponges, not tissues or cotton balls. Do not scrub or use excessive force. Pat dry using gauze sponges, not tissue or cotton balls. Peri-Wound Care: AandD Ointment Every Other Day/30 Days Discharge Instructions: Apply AandD Ointment to areas of redness Topical: Santyl Collagenase Ointment, 30 (gm), tube Every Other Day/30 Days Discharge Instructions: apply nickel thick to wound bed only Prim Dressing: Algicell Calcium Alginate Dressing, 4X5 (in/in) Every Other Day/30 Days ary Secondary Dressing: ABD Pad 5x9 (in/in) Every Other Day/30 Days Discharge Instructions: Cover with ABD pad Secured With: Kerlix Roll Sterile or Non-Sterile 6-ply 4.5x4 (yd/yd) Every Other Day/30 Days Discharge Instructions: Apply Kerlix as directed WOUND #18: - T Second Wound Laterality: Right, Medial oe Cleanser: Soap and Water Every Other Day/30 Days Discharge  Instructions: Gently cleanse wound with antibacterial soap, rinse and pat dry prior to dressing wounds Cleanser: Wound Cleanser Every Other Day/30 Days Discharge Instructions: Wash your hands with soap and water. Remove old dressing, discard into plastic bag and place into trash. Cleanse the wound with Wound Cleanser prior to applying a clean dressing using gauze sponges, not tissues or cotton balls. Do not scrub or use excessive force. Pat dry using gauze sponges, not tissue or cotton balls. Peri-Wound Care: AandD Ointment Every Other Day/30 Days Discharge Instructions: Apply AandD Ointment to areas of redness Topical: Santyl Collagenase Ointment, 30 (gm), tube Every Other Day/30 Days Discharge Instructions: apply nickel thick to wound bed only Prim Dressing: Gauze Every Other Day/30 Days ary Discharge Instructions: As directed: dry, moistened with saline or moistened with Dakins Solution Secondary Dressing: ABD Pad 5x9 (in/in) Every Other Day/30 Days Discharge Instructions: Cover with ABD pad Secured With: Kerlix Roll Sterile or Non-Sterile 6-ply 4.5x4 (yd/yd) Every Other Day/30 Days Discharge Instructions: Apply Kerlix as directed 1. I would recommend currently that the patient should go for repeat arterial study with ABI and TBI. 2. Also can recommend patient should continue to monitor for any signs of infection systemically such as fevers, chills,  nausea, vomiting, or diarrhea. 3. I am also can recommend that he should continue to elevate his legs much as possible and again working to continue with the Santyl for the time being try to clean up these wounds. We will see patient back for reevaluation in 1 week here in the clinic. If anything worsens or changes patient will contact our office for additional recommendations. Addendum: After the patient left our office we actually got a fax from the skilled nursing facility where they previously had already done an arterial study. This  arterial study showed that the patient had an ABI of 0.59 on the left which is not good and probably is the reason why he is having issues with the wound not healing. For that reason I do believe that the patient needs to be seen as soon as possible with vascular regular go ahead and see about making that referral we originally sent it to the skilled nursing facility however I do not think they sent the remainder of the information along to vascular therefore we will just can send a copy of our note so they have the full picture of what is going on with this patient and can get him taken care of. Electronic Signature(s) Signed: 10/31/2022 10:47:38 AM By: [name] PA-C Previous Signature: 10/31/2022 10:44:40 AM Version By: [name] PA-C Previous Signature: 10/30/2022 9:37:36 AM Version By: [name] PA-C Previous Signature: 10/30/2022 9:09:38 AM Version By: [name] PA-C Entered By: [name] on 10/31/2022 10:47:38 -------------------------------------------------------------------------------- SuperBill Details Patient Name: Date of Service: YOSSEF, GILKISON 10/30/2022 Medical Record Number: [id number] Patient Account Number: [id number] [name] ([id number]) [id number] Page 17 of 17 Date of Birth/Sex: Treating RN: 1961-04-08 (62 y.o. Male) Midge Aver Primary Care Provider: [name] Other Clinician: Referring Provider: Treating Provider/Extender: [name] Weeks in Treatment: 46 Diagnosis Coding ICD-10 Codes Code Description [phone] Chronic venous hypertension (idiopathic) with ulcer and inflammation of left lower extremity 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 side 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,  stage 3 Facility Procedures : CPT4 Code: [id number] Description: 62130 - DEBRIDE W/O ANES NON SELECT Modifier: Quantity: 1 Physician Procedures : CPT4 Code Description Modifier [id number] - WC PHYS LEVEL 4 - EST PT ICD-10 Diagnosis Description 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 (idiopathic) with ulcer and inflammation of left lower extremity L97.322 Non-pressure chronic ulcer of left ankle  with fat layer exposed L98.492 Non-pressure chronic ulcer of skin of other sites with fat layer exposed 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 side Quantity: 1 Electronic Signature(s) Signed: 10/30/2022 12:53:32 PM By: Midge Aver MSN RN CNS WTA Signed: 10/31/2022 8:47:01 AM By: [name] PA-C Previous Signature: 10/30/2022 9:10:51 AM Version By: [name] PA-C Entered By: Midge Aver on 10/30/2022 12:53:31

## 2022-10-31 ENCOUNTER — Telehealth (INDEPENDENT_AMBULATORY_CARE_PROVIDER_SITE_OTHER): Payer: Self-pay

## 2022-10-31 NOTE — Telephone Encounter (Signed)
LVM with Victorino Dike from [facility] [phone]. Victorino Dike called [facility] questioning if we had received a referral for pt. I returned call and LVM advising that I had received the referral, however, there is no ordering provider, no ICD-10 code, no office number and no way to contact anyone. I asked for a call back so we can get the additional information and get pt scheduled.

## 2022-10-31 NOTE — Progress Notes (Signed)
KONSTANTIN, HEIGES ([id number]) [id number] Page 1 of 16 Visit Report for 10/30/2022 Arrival Information Details Patient Name: Date of Service: Chad [name], Chad [name] 10/30/2022 8:30 A M Medical Record Number: [id number] Patient Account Number: [id number] Date of Birth/Sex: Treating RN: [birth year] (62 y.o. [name] Primary Care Selam Pietsch: [name] Other Clinician: Referring Emerly Prak: Treating Delia Sitar/Extender: [name] in Treatment: 46 Visit Information History Since Last Visit Added or deleted any medications: No Patient Arrived: Wheel Chair Has Dressing in Place as Prescribed: Yes Arrival Time: 08:15 Pain Present Now: No Accompanied By: self Transfer Assistance: None Patient Identification Verified: Yes Secondary Verification Process Completed: Yes Patient Requires Transmission-Based No Precautions: Patient Has Alerts: Yes Patient Alerts: Patient on Blood Thinner NOT diabetic aspirin 81mg  Lives [facility] Electronic Signature(s) Signed: 10/30/2022 12:51:50 PM By: Midge Aver MSN RN CNS WTA Entered By: Midge Aver on 10/30/2022 12:51:49 -------------------------------------------------------------------------------- Clinic Level of Care Assessment Details Patient Name: Date of Service: Chad [name], Chad [name] 10/30/2022 8:30 A M Medical Record Number: [id number] Patient Account Number: [id number] Date of Birth/Sex: Treating RN: May 06, 1961 (62 y.o. [name] Primary Care Ala Capri: [name] Other Clinician: Referring Janique Hoefer: Treating Raoul Ciano/Extender: [name] in Treatment: 46 Clinic Level of Care Assessment Items TOOL 1 Quantity Score []  - 0 Use when EandM and Procedure is performed on INITIAL visit ASSESSMENTS - Nursing Assessment / Reassessment []  - 0 General Physical Exam (combine w/ comprehensive assessment (listed just below) when performed on new pt. evals) []  - 0 Comprehensive  Assessment (HX, ROS, Risk Assessments, Wounds Hx, etc.) ASSESSMENTS - Wound and Skin Assessment / Reassessment []  - 0 Dermatologic / Skin Assessment (not related to wound area) ASSESSMENTS - Ostomy and/or Continence Assessment and Care []  - 0 Incontinence Assessment and Management []  - 0 Ostomy Care Assessment and Management (repouching, etc.) PROCESS - Coordination of Care []  - 0 Simple Patient / Family Education for ongoing care []  - 0 Complex (extensive) Patient / Family Education for ongoing care []  - 0 Staff obtains Chiropractor, Records, T Results / Process Orders est []  - 0 Staff telephones HHA, Nursing Homes / Clarify orders / etc []  - 0 Routine Transfer to another Facility (non-emergent condition) []  - 0 Routine Hospital Admission (non-emergent condition) []  - 0 New Admissions / Insurance Authorizations / Ordering NPWT Apligraf, etc. , Vickey Sages, [name] ([id number]) [id number] Page 2 of 16 []  - 0 Emergency Hospital Admission (emergent condition) PROCESS - Special Needs []  - 0 Pediatric / Minor Patient Management []  - 0 Isolation Patient Management []  - 0 Hearing / Language / Visual special needs []  - 0 Assessment of Community assistance (transportation, D/C planning, etc.) []  - 0 Additional assistance / Altered mentation []  - 0 Support Surface(s) Assessment (bed, cushion, seat, etc.) INTERVENTIONS - Miscellaneous []  - 0 External ear exam []  - 0 Patient Transfer (multiple staff / Nurse, adult / Similar devices) []  - 0 Simple Staple / Suture removal (25 or less) []  - 0 Complex Staple / Suture removal (26 or more) []  - 0 Hypo/Hyperglycemic Management (do not check if billed separately) []  - 0 Ankle / Brachial Index (ABI) - do not check if billed separately Has the patient been seen at the hospital within the last three years: Yes Total Score: 0 Level Of Care: ____ Electronic Signature(s) Signed: 10/30/2022 4:51:01 PM By: Midge Aver  MSN RN CNS WTA Entered By: Midge Aver on 10/30/2022 12:53:25 -------------------------------------------------------------------------------- Encounter Discharge Information Details Patient Name: Date of Service: Chad [name]  10/30/2022 8:30 A M Medical Record Number: [id number] Patient Account Number: [id number] Date of Birth/Sex: Treating RN: 1961/06/11 (62 y.o. [name] Primary Care Rayley Gao: [name] Other Clinician: Referring Elexia Friedt: Treating Mahati Vajda/Extender: [name] in Treatment: 46 Encounter Discharge Information Items Post Procedure Vitals Discharge Condition: Stable Temperature (F): 98.2 Ambulatory Status: Wheelchair Pulse (bpm): 88 Discharge Destination: Home Respiratory Rate (breaths/min): 16 Transportation: Private Auto Blood Pressure (mmHg): 132/85 Accompanied By: self Schedule Follow-up Appointment: Yes Clinical Summary of Care: Electronic Signature(s) Signed: 10/30/2022 12:54:18 PM By: Midge Aver MSN RN CNS WTA Previous Signature: 10/30/2022 10:52:30 AM Version By: Midge Aver MSN RN CNS WTA Entered By: Midge Aver on 10/30/2022 12:54:18 -------------------------------------------------------------------------------- Lower Extremity Assessment Details Patient Name: Date of Service: Chad [name] 10/30/2022 8:30 A M Medical Record Number: [id number] Patient Account Number: [id number] Date of Birth/Sex: Treating RN: Feb 27, 1961 (62 y.o. [name] Primary Care Jenisis Harmsen: [name] Other Clinician: Referring Emmer Lillibridge: Treating Syon Tews/Extender: [name] Weeks in Treatment: 46 Edema Assessment L[Left: Arva Chafe ([id number])] Franne Forts: [id number] Page 3 of 16] Assessed: [Left: No] [Right: No] Edema: [Left: No] [Right: No] Calf Left: Right: Point of Measurement: 33 cm From Medial Instep 37 cm 36 cm Ankle Left: Right: Point of Measurement: 12 cm From Medial Instep  23 cm 23 cm Vascular Assessment Pulses: Dorsalis Pedis Palpable: [Left:Yes] [Right:Yes] Electronic Signature(s) Signed: 10/30/2022 12:52:08 PM By: Midge Aver MSN RN CNS WTA Entered By: Midge Aver on 10/30/2022 12:52:08 -------------------------------------------------------------------------------- Multi Wound Chart Details Patient Name: Date of Service: Chad [name] 10/30/2022 8:30 A M Medical Record Number: [id number] Patient Account Number: [id number] Date of Birth/Sex: Treating RN: 08/30/1960 (62 y.o. [name] Primary Care Aarib Pulido: [name] Other Clinician: Referring Cathleen Yagi: Treating Estephanie Hubbs/Extender: [name] Weeks in Treatment: 46 Vital Signs Height(in): 69 Pulse(bpm): 88 Weight(lbs): 150 Blood Pressure(mmHg): 132/85 Body Mass Index(BMI): 22.1 Temperature(F): 98.2 Respiratory Rate(breaths/min): 16 [12:Photos:] Left, Distal, Medial Ankle Left, Dorsal Foot Right Calcaneus Wound Location: Gradually Appeared Gradually Appeared Gradually Appeared Wounding Event: Venous Leg Ulcer Venous Leg Ulcer Pressure Ulcer Primary Etiology: Anemia, Chronic Obstructive Anemia, Chronic Obstructive Anemia, Chronic Obstructive Comorbid History: Pulmonary Disease (COPD), Coronary Pulmonary Disease (COPD), Coronary Pulmonary Disease (COPD), Coronary Artery Disease, Peripheral Arterial Artery Disease, Peripheral Arterial Artery Disease, Peripheral Arterial Disease, Peripheral Venous Disease, Disease, Peripheral Venous Disease, Disease, Peripheral Venous Disease, Hepatitis C, Osteoarthritis, Hepatitis C, Osteoarthritis, Hepatitis C, Osteoarthritis, Neuropathy, Received Chemotherapy Neuropathy, Received Chemotherapy Neuropathy, Received Chemotherapy 07/12/2019 10/01/2022 09/26/2022 Date Acquired: 46 3 3 Weeks of Treatment: Open Open Open Wound Status: No No No Wound Recurrence: 1.5x0.8x0.1 2.5x1.5x0.1 1x1x0.1 Measurements L x W x D (cm) 0.942 2.945  0.785 A (cm) : rea 0.094 0.295 0.079 Volume (cm) : 86.40% -25.00% 16.70% % Reduction in Area: 93.20% -25.00% 58.00% % Reduction in Volume: Full Thickness Without Exposed Full Thickness Without Exposed Category/Stage III Classification: Support Structures Support Structures Medium Medium Medium Exudate Amount: Serosanguineous Serosanguineous Serosanguineous Exudate Type: red, brown red, brown red, brown Exudate Color: Distinct, outline attached N/A N/A Wound MarginWACEY, ZIEGER ([id number]) [id number] Page 4 of 16 Large (67-100%) None Present (0%) Medium (34-66%) Granulation Amount: Pink N/A Red Granulation Quality: Small (1-33%) Large (67-100%) Medium (34-66%) Necrotic Amount: Adherent Slough Adherent [facility] Necrotic Tissue: Fat Layer (Subcutaneous Tissue): Yes Fascia: No Fat Layer (Subcutaneous Tissue): Yes Exposed Structures: Fascia: No Fat Layer (Subcutaneous Tissue): No Fascia: No Tendon: No Tendon: No Tendon: No Muscle: No Muscle: No Muscle: No Joint: No Joint:  No Joint: No Bone: No Bone: No Bone: No None None None Epithelialization: N/A Chemical/Enzymatic/Mechanical Chemical/Enzymatic/Mechanical Debridement: Pre-procedure Verification/Time Out N/A 09:10 09:10 Taken: N/A Other(gauze) Other(gauze) Instrument: N/A None None Bleeding: N/A Procedure was tolerated well Procedure was tolerated well Debridement Treatment Response: N/A 2.5x1.5x0.1 1x1x0.1 Post Debridement Measurements L x W x D (cm) N/A 0.295 0.079 Post Debridement Volume: (cm) N/A N/A Category/Stage III Post Debridement Stage: N/A Debridement Debridement Procedures Performed: Wound Number: [id number] N/A Photos: N/A Left, Anterior Lower Leg Right, Medial T Second oe N/A Wound Location: Pressure Injury Pressure Injury N/A Wounding Event: Pressure Ulcer Pressure Ulcer N/A Primary Etiology: Anemia, Chronic Obstructive Anemia, Chronic  Obstructive N/A Comorbid History: Pulmonary Disease (COPD), Coronary Pulmonary Disease (COPD), Coronary Artery Disease, Peripheral Arterial Artery Disease, Peripheral Arterial Disease, Peripheral Venous Disease, Disease, Peripheral Venous Disease, Hepatitis C, Osteoarthritis, Hepatitis C, Osteoarthritis, Neuropathy, Received Chemotherapy Neuropathy, Received Chemotherapy 09/30/2022 09/26/2022 N/A Date Acquired: 2 2 N/A Weeks of Treatment: Open Open N/A Wound Status: No No N/A Wound Recurrence: 3x2.3x0.3 0.5x0.6x0.1 N/A Measurements L x W x D (cm) 5.419 0.236 N/A A (cm) : rea 1.626 0.024 N/A Volume (cm) : -31.40% 57.10% N/A % Reduction in A rea: -294.70% 56.40% N/A % Reduction in Volume: Category/Stage II Category/Stage II N/A Classification: Medium Medium N/A Exudate A mount: Serosanguineous Serosanguineous N/A Exudate Type: red, brown red, brown N/A Exudate Color: N/A N/A N/A Wound Margin: Small (1-33%) Small (1-33%) N/A Granulation A mount: Red Red, Pink N/A Granulation Quality: Medium (34-66%) Medium (34-66%) N/A Necrotic A mount: Eschar Adherent Slough N/A Necrotic Tissue: Fascia: No Fascia: No N/A Exposed Structures: Fat Layer (Subcutaneous Tissue): No Fat Layer (Subcutaneous Tissue): No Tendon: No Tendon: No Muscle: No Muscle: No Joint: No Joint: No Bone: No Bone: No N/A Small (1-33%) N/A Epithelialization: Chemical/Enzymatic/Mechanical Chemical/Enzymatic/Mechanical N/A Debridement: Pre-procedure Verification/Time Out 09:10 09:10 N/A Taken: Other(gauze) Other(gauze) N/A Instrument: None None N/A Bleeding: Procedure was tolerated well Procedure was tolerated well N/A Debridement Treatment Response: 3x2.3x0.3 0.5x0.6x0.1 N/A Post Debridement Measurements L x W x D (cm) 1.626 0.024 N/A Post Debridement Volume: (cm) Category/Stage II Category/Stage II N/A Post Debridement Stage: Debridement Debridement N/A Procedures Performed: Treatment  Notes Wound #12 (Ankle) Wound Laterality: Left, Medial, Distal Cleanser Hemric, [name] ([id number]) [id number] Page 5 of 16 Soap and Water Discharge Instruction: Gently cleanse wound with antibacterial soap, rinse and pat dry prior to dressing wounds Wound Cleanser Discharge Instruction: Wash your hands with soap and water. Remove old dressing, discard into plastic bag and place into trash. Cleanse the wound with Wound Cleanser prior to applying a clean dressing using gauze sponges, not tissues or cotton balls. Do not scrub or use excessive force. Pat dry using gauze sponges, not tissue or cotton balls. Peri-Wound Care AandD Ointment Discharge Instruction: Apply AandD Ointment to areas of redness Topical Primary Dressing Hydrofera Blue Ready Transfer Foam, 4x5 (in/in) Discharge Instruction: Apply Hydrofera Blue Ready to wound bed as directed Secondary Dressing ABD Pad 5x9 (in/in) Discharge Instruction: Cover with ABD pad Secured With Kerlix Roll Sterile or Non-Sterile 6-ply 4.5x4 (yd/yd) Discharge Instruction: Apply Kerlix as directed Compression Wrap Compression Stockings Add-Ons Wound #15 (Foot) Wound Laterality: Dorsal, Left Cleanser Soap and Water Discharge Instruction: Gently cleanse wound with antibacterial soap, rinse and pat dry prior to dressing wounds Wound Cleanser Discharge Instruction: Wash your hands with soap and water. Remove old dressing, discard into plastic bag and place into trash. Cleanse the wound with Wound Cleanser prior to applying a clean dressing using gauze sponges,  not tissues or cotton balls. Do not scrub or use excessive force. Pat dry using gauze sponges, not tissue or cotton balls. Peri-Wound Care AandD Ointment Discharge Instruction: Apply AandD Ointment to areas of redness Topical Santyl Collagenase Ointment, 30 (gm), tube Discharge Instruction: apply nickel thick to wound bed only Primary  Dressing Gauze Discharge Instruction: As directed: dry, moistened with saline or moistened with Dakins Solution Secondary Dressing ABD Pad 5x9 (in/in) Discharge Instruction: Cover with ABD pad Secured With Kerlix Roll Sterile or Non-Sterile 6-ply 4.5x4 (yd/yd) Discharge Instruction: Apply Kerlix as directed Compression Wrap Compression Stockings Add-Ons Wound #16 (Calcaneus) Wound Laterality: Right Cleanser Soap and Water Discharge Instruction: Gently cleanse wound with antibacterial soap, rinse and pat dry prior to dressing wounds Wound Cleanser Discharge Instruction: Wash your hands with soap and water. Remove old dressing, discard into plastic bag and place into trash. Cleanse the wound with Wound Cleanser prior to applying a clean dressing using gauze sponges, not tissues or cotton balls. Do not scrub or use excessive Lizer, [name] ([id number]) [id number] Page 6 of 16 force. Pat dry using gauze sponges, not tissue or cotton balls. Peri-Wound Care AandD Ointment Discharge Instruction: Apply AandD Ointment to areas of redness Topical Santyl Collagenase Ointment, 30 (gm), tube Discharge Instruction: apply nickel thick to wound bed only Primary Dressing Gauze Discharge Instruction: As directed: dry, moistened with saline or moistened with Dakins Solution Secondary Dressing ABD Pad 5x9 (in/in) Discharge Instruction: Cover with ABD pad Secured With Kerlix Roll Sterile or Non-Sterile 6-ply 4.5x4 (yd/yd) Discharge Instruction: Apply Kerlix as directed Compression Wrap Compression Stockings Add-Ons Wound #17 (Lower Leg) Wound Laterality: Left, Anterior Cleanser Soap and Water Discharge Instruction: Gently cleanse wound with antibacterial soap, rinse and pat dry prior to dressing wounds Wound Cleanser Discharge Instruction: Wash your hands with soap and water. Remove old dressing, discard into plastic bag and place into trash. Cleanse the wound with  Wound Cleanser prior to applying a clean dressing using gauze sponges, not tissues or cotton balls. Do not scrub or use excessive force. Pat dry using gauze sponges, not tissue or cotton balls. Peri-Wound Care AandD Ointment Discharge Instruction: Apply AandD Ointment to areas of redness Topical Santyl Collagenase Ointment, 30 (gm), tube Discharge Instruction: apply nickel thick to wound bed only Primary Dressing Algicell Calcium Alginate Dressing, 4X5 (in/in) Secondary Dressing ABD Pad 5x9 (in/in) Discharge Instruction: Cover with ABD pad Secured With Kerlix Roll Sterile or Non-Sterile 6-ply 4.5x4 (yd/yd) Discharge Instruction: Apply Kerlix as directed Compression Wrap Compression Stockings Add-Ons Wound #18 (Toe Second) Wound Laterality: Right, Medial Cleanser Soap and Water Discharge Instruction: Gently cleanse wound with antibacterial soap, rinse and pat dry prior to dressing wounds Wound Cleanser Discharge Instruction: Wash your hands with soap and water. Remove old dressing, discard into plastic bag and place into trash. Cleanse the wound with Wound Cleanser prior to applying a clean dressing using gauze sponges, not tissues or cotton balls. Do not scrub or use excessive force. Pat dry using gauze sponges, not tissue or cotton balls. Peri-Wound Care [address] CAIDENCE, HIGASHI ([id number]) [id number] Page 7 of 16 Discharge Instruction: Apply AandD Ointment to areas of redness Topical Santyl Collagenase Ointment, 30 (gm), tube Discharge Instruction: apply nickel thick to wound bed only Primary Dressing Gauze Discharge Instruction: As directed: dry, moistened with saline or moistened with Dakins Solution Secondary Dressing ABD Pad 5x9 (in/in) Discharge Instruction: Cover with ABD pad Secured With Kerlix Roll Sterile or Non-Sterile 6-ply 4.5x4 (yd/yd) Discharge Instruction: Apply Kerlix as directed Compression Wrap  Compression  Stockings Add-Ons Electronic Signature(s) Signed: 10/30/2022 12:52:22 PM By: Midge AverSmith, Vicki MSN RN CNS WTA Entered By: Midge AverSmith, Vicki on 10/30/2022 12:52:21 -------------------------------------------------------------------------------- Multi-Disciplinary Care Plan Details Patient Name: Date of Service: Chad HaggardLINDGREN, Chad [name] 10/30/2022 8:30 A M Medical Record Number: [id number] Patient Account Number: [id number] Date of Birth/Sex: Treating RN: 03/25/1961 (62 y.o. Roel CluckM) Smith, Vicki Primary Care Camri Molloy: Cyril MourningLawson, Healy Other Clinician: Referring [place]: Treating Alenah Sarria/Extender: Eusebio FriendlyStone, Hoyt Lawson, Healy Weeks in Treatment: [phone] Active Inactive Venous Leg Ulcer Nursing Diagnoses: Knowledge deficit related to disease process and management Goals: Patient will maintain optimal edema control Date Initiated: 12/06/2021 Date Inactivated: 12/27/2021 Target Resolution Date: 01/03/2022 Goal Status: Met Patient/caregiver will verbalize understanding of disease process and disease management Date Initiated: 12/06/2021 Target Resolution Date: 10/28/2022 Goal Status: Active Interventions: Assess peripheral edema status every visit. Compression as ordered Notes: Electronic Signature(s) Signed: 10/30/2022 12:53:38 PM By: Midge AverSmith, Vicki MSN RN CNS WTA Entered By: Midge AverSmith, Vicki on 10/30/2022 12:53:38 -------------------------------------------------------------------------------- Pain Assessment Details Patient Name: Date of Service: Chad HaggardLINDGREN, Chad [name] 10/30/2022 8:30 A Adrienne MochaM Saksa, Logen ([id number]) [id number] Page 8 of 16 Medical Record Number: [id number] Patient Account Number: [id number] Date of Birth/Sex: Treating RN: 03/22/1961 (62 y.o. Roel CluckM) Smith, Vicki Primary Care Keyandre Pileggi: Cyril MourningLawson, Healy Other Clinician: Referring Darnesha Diloreto: Treating Amine Adelson/Extender: Eusebio FriendlyStone, Hoyt Lawson, Healy Weeks in Treatment: 46 Active Problems Location of Pain Severity and Description of Pain Patient Has  Paino No Site Locations Pain Management and Medication Current Pain Management: Electronic Signature(s) Signed: 10/30/2022 12:51:59 PM By: Midge AverSmith, Vicki MSN RN CNS WTA Entered By: Midge AverSmith, Vicki on 10/30/2022 12:51:59 -------------------------------------------------------------------------------- Patient/Caregiver Education Details Patient Name: Date of Service: Brester, Chad [name] 4/4/2024andnbsp8:30 A M Medical Record Number: [id number] Patient Account Number: [id number] Date of Birth/Gender: Treating RN: 10/06/1960 (62 y.o. Roel CluckM) Smith, Vicki Primary Care Physician: Cyril MourningLawson, Healy Other Clinician: Referring Physician: Treating Physician/Extender: Charlesetta IvoryStone, Hoyt Lawson, Healy Weeks in Treatment: 2046 Education Assessment Education Provided To: Patient and Caregiver Education Topics Provided Wound/Skin Impairment: Handouts: Caring for Your Ulcer Methods: Explain/Verbal Responses: State content correctly Electronic Signature(s) Signed: 10/30/2022 4:51:01 PM By: Midge AverSmith, Vicki MSN RN CNS WTA Entered By: Midge AverSmith, Vicki on 10/30/2022 12:53:42 -------------------------------------------------------------------------------- Wound Assessment Details Patient Name: Date of Service: Chad CoxLINDGREN, Chad [name] 10/30/2022 8:30 A M Medical Record Number: [id number] Patient Account Number: [id number] Lenda KelpLINDGREN, Chad [name] ([id number]) [id number] Page 9 of 16 Date of Birth/Sex: Treating RN: 12/31/1960 (62 y.o. Roel CluckM) Smith, Vicki Primary Care Lataya Varnell: Other Clinician: Cyril MourningLawson, Healy Referring Arvle Grabe: Treating Tayshaun Kroh/Extender: Eusebio FriendlyStone, Hoyt Lawson, Healy Weeks in Treatment: 46 Wound Status Wound Number: 12 Primary Venous Leg Ulcer Etiology: Wound Location: Left, Distal, Medial Ankle Wound Open Wounding Event: Gradually Appeared Status: Date Acquired: 07/12/2019 Comorbid Anemia, Chronic Obstructive Pulmonary Disease (COPD), Coronary Weeks Of Treatment: 46 History: Artery Disease, Peripheral  Arterial Disease, Peripheral Venous Clustered Wound: No Disease, Hepatitis C, Osteoarthritis, Neuropathy, Received Chemotherapy Photos Wound Measurements Length: (cm) 1.5 Width: (cm) 0.8 Depth: (cm) 0.1 Area: (cm) 0.942 Volume: (cm) 0.094 % Reduction in Area: 86.4% % Reduction in Volume: 93.2% Epithelialization: None Wound Description Classification: Full Thickness Without Exposed Support Structures Wound Margin: Distinct, outline attached Exudate Amount: Medium Exudate Type: Serosanguineous Exudate Color: red, brown Foul Odor After Cleansing: No Slough/Fibrino Yes Wound Bed Granulation Amount: Large (67-100%) Exposed Structure Granulation Quality: Pink Fascia Exposed: No Necrotic Amount: Small (1-33%) Fat Layer (Subcutaneous Tissue) Exposed: Yes Necrotic Quality: Adherent Slough Tendon Exposed: No Muscle Exposed: No Joint Exposed: No Bone Exposed: No Treatment Notes Wound #12 (Ankle) Wound Laterality: Left, Medial, Distal Cleanser Soap  and Water Discharge Instruction: Gently cleanse wound with antibacterial soap, rinse and pat dry prior to dressing wounds Wound Cleanser Discharge Instruction: Wash your hands with soap and water. Remove old dressing, discard into plastic bag and place into trash. Cleanse the wound with Wound Cleanser prior to applying a clean dressing using gauze sponges, not tissues or cotton balls. Do not scrub or use excessive force. Pat dry using gauze sponges, not tissue or cotton balls. Peri-Wound Care AandD Ointment Discharge Instruction: Apply AandD Ointment to areas of redness Topical Primary Dressing Hydrofera Blue Ready Transfer Foam, 4x5 (in/in) Discharge Instruction: Apply Hydrofera Blue Ready to wound bed as directed KYRELL, RUACHO ([id number]) [id number] Page 10 of 16 Secondary Dressing ABD Pad 5x9 (in/in) Discharge Instruction: Cover with ABD pad Secured With Kerlix Roll Sterile or Non-Sterile 6-ply  4.5x4 (yd/yd) Discharge Instruction: Apply Kerlix as directed Compression Wrap Compression Stockings Add-Ons Electronic Signature(s) Signed: 10/30/2022 4:51:01 PM By: Midge Aver MSN RN CNS WTA Entered By: Midge Aver on 10/30/2022 08:38:41 -------------------------------------------------------------------------------- Wound Assessment Details Patient Name: Date of Service: Chad [name] 10/30/2022 8:30 A M Medical Record Number: [id number] Patient Account Number: [id number] Date of Birth/Sex: Treating RN: 1961/01/20 (62 y.o. [name] Primary Care Jacub Waiters: [name] Other Clinician: Referring Dorsel Flinn: Treating Ellyana Crigler/Extender: [name] Weeks in Treatment: 46 Wound Status Wound Number: 15 Primary Venous Leg Ulcer Etiology: Wound Location: Left, Dorsal Foot Wound Open Wounding Event: Gradually Appeared Status: Date Acquired: 10/01/2022 Comorbid Anemia, Chronic Obstructive Pulmonary Disease (COPD), Coronary Weeks Of Treatment: 3 History: Artery Disease, Peripheral Arterial Disease, Peripheral Venous Clustered Wound: No Disease, Hepatitis C, Osteoarthritis, Neuropathy, Received Chemotherapy Photos Wound Measurements Length: (cm) 2.5 Width: (cm) 1.5 Depth: (cm) 0.1 Area: (cm) 2.945 Volume: (cm) 0.295 % Reduction in Area: -25% % Reduction in Volume: -25% Epithelialization: None Wound Description Classification: Full Thickness Without Exposed Support Exudate Amount: Medium Exudate Type: Serosanguineous Exudate Color: red, brown Structures Foul Odor After Cleansing: No Slough/Fibrino Yes Wound Bed Granulation Amount: None Present (0%) Exposed Structure Necrotic Amount: Large (67-100%) Fascia Exposed: No Necrotic Quality: Adherent Slough Fat Layer (Subcutaneous Tissue) Exposed: No Tendon Exposed: No Muscle Exposed: No Joint Exposed: No Bone Exposed: No Szeto, [name] ([id number]) [id number] Page 11 of  16 Treatment Notes Wound #15 (Foot) Wound Laterality: Dorsal, Left Cleanser Soap and Water Discharge Instruction: Gently cleanse wound with antibacterial soap, rinse and pat dry prior to dressing wounds Wound Cleanser Discharge Instruction: Wash your hands with soap and water. Remove old dressing, discard into plastic bag and place into trash. Cleanse the wound with Wound Cleanser prior to applying a clean dressing using gauze sponges, not tissues or cotton balls. Do not scrub or use excessive force. Pat dry using gauze sponges, not tissue or cotton balls. Peri-Wound Care AandD Ointment Discharge Instruction: Apply AandD Ointment to areas of redness Topical Santyl Collagenase Ointment, 30 (gm), tube Discharge Instruction: apply nickel thick to wound bed only Primary Dressing Gauze Discharge Instruction: As directed: dry, moistened with saline or moistened with Dakins Solution Secondary Dressing ABD Pad 5x9 (in/in) Discharge Instruction: Cover with ABD pad Secured With Kerlix Roll Sterile or Non-Sterile 6-ply 4.5x4 (yd/yd) Discharge Instruction: Apply Kerlix as directed Compression Wrap Compression Stockings Add-Ons Electronic Signature(s) Signed: 10/30/2022 4:51:01 PM By: Midge Aver MSN RN CNS WTA Entered By: Midge Aver on 10/30/2022 08:39:14 -------------------------------------------------------------------------------- Wound Assessment Details Patient Name: Date of Service: Chad [name] 10/30/2022 8:30 A M Medical Record Number: [id number] Patient Account Number: [id number] Date of Birth/Sex: Treating RN:  Aug 01, 1960 (62 y.o. [name] Primary Care Aili Casillas: [name] Other Clinician: Referring Giara Mcgaughey: Treating Versia Mignogna/Extender: [name] Weeks in Treatment: 46 Wound Status Wound Number: 16 Primary Pressure Ulcer Etiology: Wound Location: Right Calcaneus Wound Open Wounding Event: Gradually Appeared Status: Date Acquired:  09/26/2022 Comorbid Anemia, Chronic Obstructive Pulmonary Disease (COPD), Coronary Weeks Of Treatment: 3 History: Artery Disease, Peripheral Arterial Disease, Peripheral Venous Clustered Wound: No Disease, Hepatitis C, Osteoarthritis, Neuropathy, Received Chemotherapy Photos Chad [name], Chad [name] ([id number]) [id number] Page 12 of 16 Wound Measurements Length: (cm) 1 Width: (cm) 1 Depth: (cm) 0.1 Area: (cm) 0.785 Volume: (cm) 0.079 % Reduction in Area: 16.7% % Reduction in Volume: 58% Epithelialization: None Wound Description Classification: Category/Stage III Exudate Amount: Medium Exudate Type: Serosanguineous Exudate Color: red, brown Foul Odor After Cleansing: No Slough/Fibrino Yes Wound Bed Granulation Amount: Medium (34-66%) Exposed Structure Granulation Quality: Red Fascia Exposed: No Necrotic Amount: Medium (34-66%) Fat Layer (Subcutaneous Tissue) Exposed: Yes Necrotic Quality: Adherent Slough Tendon Exposed: No Muscle Exposed: No Joint Exposed: No Bone Exposed: No Treatment Notes Wound #16 (Calcaneus) Wound Laterality: Right Cleanser Soap and Water Discharge Instruction: Gently cleanse wound with antibacterial soap, rinse and pat dry prior to dressing wounds Wound Cleanser Discharge Instruction: Wash your hands with soap and water. Remove old dressing, discard into plastic bag and place into trash. Cleanse the wound with Wound Cleanser prior to applying a clean dressing using gauze sponges, not tissues or cotton balls. Do not scrub or use excessive force. Pat dry using gauze sponges, not tissue or cotton balls. Peri-Wound Care AandD Ointment Discharge Instruction: Apply AandD Ointment to areas of redness Topical Santyl Collagenase Ointment, 30 (gm), tube Discharge Instruction: apply nickel thick to wound bed only Primary Dressing Gauze Discharge Instruction: As directed: dry, moistened with saline or moistened with Dakins  Solution Secondary Dressing ABD Pad 5x9 (in/in) Discharge Instruction: Cover with ABD pad Secured With Kerlix Roll Sterile or Non-Sterile 6-ply 4.5x4 (yd/yd) Discharge Instruction: Apply Kerlix as directed Compression Wrap Compression Stockings Add-Ons Electronic Signature(s) [name] ([id number]) [id number] Page 13 of 16 Signed: 10/30/2022 4:51:01 PM By: Midge Aver MSN RN CNS WTA Entered By: Midge Aver on 10/30/2022 08:39:46 -------------------------------------------------------------------------------- Wound Assessment Details Patient Name: Date of Service: Chad [name], Chad [name] 10/30/2022 8:30 A M Medical Record Number: [id number] Patient Account Number: [id number] Date of Birth/Sex: Treating RN: 02-28-1961 (62 y.o. [name] Primary Care Zuzu Befort: [name] Other Clinician: Referring Tahmid Stonehocker: Treating Jazmina Muhlenkamp/Extender: [name] Weeks in Treatment: 46 Wound Status Wound Number: 17 Primary Pressure Ulcer Etiology: Wound Location: Left, Anterior Lower Leg Wound Open Wounding Event: Pressure Injury Status: Date Acquired: 09/30/2022 Comorbid Anemia, Chronic Obstructive Pulmonary Disease (COPD), Coronary Weeks Of Treatment: 2 History: Artery Disease, Peripheral Arterial Disease, Peripheral Venous Clustered Wound: No Disease, Hepatitis C, Osteoarthritis, Neuropathy, Received Chemotherapy Photos Wound Measurements Length: (cm) 3 Width: (cm) 2.3 Depth: (cm) 0.3 Area: (cm) 5.419 Volume: (cm) 1.626 % Reduction in Area: -31.4% % Reduction in Volume: -294.7% Wound Description Classification: Category/Stage II Exudate Amount: Medium Exudate Type: Serosanguineous Exudate Color: red, brown Foul Odor After Cleansing: No Slough/Fibrino Yes Wound Bed Granulation Amount: Small (1-33%) Exposed Structure Granulation Quality: Red Fascia Exposed: No Necrotic Amount: Medium (34-66%) Fat Layer (Subcutaneous Tissue)  Exposed: No Necrotic Quality: Eschar Tendon Exposed: No Muscle Exposed: No Joint Exposed: No Bone Exposed: No Treatment Notes Wound #17 (Lower Leg) Wound Laterality: Left, Anterior Cleanser Soap and Water Discharge Instruction: Gently cleanse wound with antibacterial soap, rinse and pat dry prior to  dressing wounds Wound Cleanser Discharge Instruction: Wash your hands with soap and water. Remove old dressing, discard into plastic bag and place into trash. Cleanse the wound with Wound Cleanser prior to applying a clean dressing using gauze sponges, not tissues or cotton balls. Do not scrub or use excessive force. Pat dry using gauze sponges, not tissue or cotton balls. Peri-Wound Care Chad [name], Chad [name] ([id number]) [id number] Page 14 of 16 AandD Ointment Discharge Instruction: Apply AandD Ointment to areas of redness Topical Santyl Collagenase Ointment, 30 (gm), tube Discharge Instruction: apply nickel thick to wound bed only Primary Dressing Algicell Calcium Alginate Dressing, 4X5 (in/in) Secondary Dressing ABD Pad 5x9 (in/in) Discharge Instruction: Cover with ABD pad Secured With Kerlix Roll Sterile or Non-Sterile 6-ply 4.5x4 (yd/yd) Discharge Instruction: Apply Kerlix as directed Compression Wrap Compression Stockings Add-Ons Electronic Signature(s) Signed: 10/30/2022 4:51:01 PM By: Midge Aver MSN RN CNS WTA Entered By: Midge Aver on 10/30/2022 08:40:14 -------------------------------------------------------------------------------- Wound Assessment Details Patient Name: Date of Service: Chad [name] 10/30/2022 8:30 A M Medical Record Number: [id number] Patient Account Number: [id number] Date of Birth/Sex: Treating RN: November 20, 1960 (62 y.o. [name] Primary Care Kolbi Tofte: [name] Other Clinician: Referring Wahid Holley: Treating Abraham Entwistle/Extender: [name] Weeks in Treatment: 46 Wound Status Wound Number: 18 Primary  Pressure Ulcer Etiology: Wound Location: Right, Medial T Second oe Wound Open Wounding Event: Pressure Injury Status: Date Acquired: 09/26/2022 Comorbid Anemia, Chronic Obstructive Pulmonary Disease (COPD), Coronary Weeks Of Treatment: 2 History: Artery Disease, Peripheral Arterial Disease, Peripheral Venous Clustered Wound: No Disease, Hepatitis C, Osteoarthritis, Neuropathy, Received Chemotherapy Photos Wound Measurements Length: (cm) 0.5 Width: (cm) 0.6 Depth: (cm) 0.1 Area: (cm) 0.236 Volume: (cm) 0.024 % Reduction in Area: 57.1% % Reduction in Volume: 56.4% Epithelialization: Small (1-33%) Wound Description Classification: Category/Stage II Exudate Amount: Medium Exudate Type: Serosanguineous Exudate Color: red, brown Chad [name], Chad [name] ([id number]) Foul Odor After Cleansing: No Slough/Fibrino Yes [id number] Page 15 of 16 Wound Bed Granulation Amount: Small (1-33%) Exposed Structure Granulation Quality: Red, Pink Fascia Exposed: No Necrotic Amount: Medium (34-66%) Fat Layer (Subcutaneous Tissue) Exposed: No Necrotic Quality: Adherent Slough Tendon Exposed: No Muscle Exposed: No Joint Exposed: No Bone Exposed: No Treatment Notes Wound #18 (Toe Second) Wound Laterality: Right, Medial Cleanser Soap and Water Discharge Instruction: Gently cleanse wound with antibacterial soap, rinse and pat dry prior to dressing wounds Wound Cleanser Discharge Instruction: Wash your hands with soap and water. Remove old dressing, discard into plastic bag and place into trash. Cleanse the wound with Wound Cleanser prior to applying a clean dressing using gauze sponges, not tissues or cotton balls. Do not scrub or use excessive force. Pat dry using gauze sponges, not tissue or cotton balls. Peri-Wound Care AandD Ointment Discharge Instruction: Apply AandD Ointment to areas of redness Topical Santyl Collagenase Ointment, 30 (gm), tube Discharge  Instruction: apply nickel thick to wound bed only Primary Dressing Gauze Discharge Instruction: As directed: dry, moistened with saline or moistened with Dakins Solution Secondary Dressing ABD Pad 5x9 (in/in) Discharge Instruction: Cover with ABD pad Secured With Kerlix Roll Sterile or Non-Sterile 6-ply 4.5x4 (yd/yd) Discharge Instruction: Apply Kerlix as directed Compression Wrap Compression Stockings Add-Ons Electronic Signature(s) Signed: 10/30/2022 4:51:01 PM By: Midge Aver MSN RN CNS WTA Entered By: Midge Aver on 10/30/2022 08:40:43 -------------------------------------------------------------------------------- Vitals Details Patient Name: Date of Service: Chad [name] 10/30/2022 8:30 A M Medical Record Number: [id number] Patient Account Number: [id number] Date of Birth/Sex: Treating RN: October 23, 1960 (62 y.o. [name] Primary Care Abrea Henle: [name] Other  Clinician: Referring Kayton Ripp: Treating Shahad Mazurek/Extender: [name] in Treatment: 46 Vital Signs Time Taken: 08:18 Temperature (F): 98.2 Height (in): 69 Pulse (bpm): 88 Weight (lbs): 150 Respiratory Rate (breaths/min): 16 Body Mass Index (BMI): 22.1 Blood Pressure (mmHg): 132/85 Reference Range: 80 - 120 mg / dl Electronic Signature(s) Chad [name], Chad [name] ([id number]) [id number] Page 16 of 16 Signed: 10/30/2022 12:51:53 PM By: Midge Aver MSN RN CNS WTA Entered By: Midge Aver on 10/30/2022 12:51:53

## 2022-11-03 LAB — AEROBIC CULTURE W GRAM STAIN (SUPERFICIAL SPECIMEN): Gram Stain: NONE SEEN

## 2022-11-04 ENCOUNTER — Other Ambulatory Visit: Payer: Self-pay

## 2022-11-04 ENCOUNTER — Emergency Department: Payer: Medicaid Other

## 2022-11-04 ENCOUNTER — Emergency Department
Admission: EM | Admit: 2022-11-04 | Discharge: 2022-11-04 | Disposition: A | Discharge status: Skilled Nursing Facility | Payer: Medicaid Other | Attending: Emergency Medicine | Admitting: Emergency Medicine

## 2022-11-04 DIAGNOSIS — J449 Chronic obstructive pulmonary disease, unspecified: Secondary | ICD-10-CM | POA: Diagnosis not present

## 2022-11-04 DIAGNOSIS — N189 Chronic kidney disease, unspecified: Secondary | ICD-10-CM | POA: Diagnosis not present

## 2022-11-04 DIAGNOSIS — R319 Hematuria, unspecified: Secondary | ICD-10-CM

## 2022-11-04 LAB — URINALYSIS, ROUTINE W REFLEX MICROSCOPIC: Specific Gravity, Urine: 1.021 (ref 1.005–1.030)

## 2022-11-04 LAB — BASIC METABOLIC PANEL
Anion gap: 6 (ref 5–15)
BUN: 27 mg/dL — ABNORMAL HIGH (ref 8–23)
CO2: 23 mmol/L (ref 22–32)
Calcium: 8.5 mg/dL — ABNORMAL LOW (ref 8.9–10.3)
Chloride: 106 mmol/L (ref 98–111)
Creatinine, Ser: 1.1 mg/dL (ref 0.61–1.24)
GFR, Estimated: >60 mL/min (ref >60)
Glucose, Bld: 92 mg/dL (ref 70–99)
Potassium: 4.1 mmol/L (ref 3.5–5.1)
Sodium: 135 mmol/L (ref 135–145)

## 2022-11-04 LAB — CBC
HCT: 43.4 % (ref 39.0–52.0)
Hemoglobin: 13.2 g/dL (ref 13.0–17.0)
MCH: 25.8 pg — ABNORMAL LOW (ref 26.0–34.0)
MCHC: 30.4 g/dL (ref 30.0–36.0)
MCV: 84.8 fL (ref 80.0–100.0)
Platelets: 146 10*3/uL — ABNORMAL LOW (ref 150–400)
RBC: 5.12 MIL/uL (ref 4.22–5.81)
RDW: 16.6 % — ABNORMAL HIGH (ref 11.5–15.5)
WBC: 5.1 10*3/uL (ref 4.0–10.5)
nRBC: 0 % (ref 0.0–0.2)

## 2022-11-04 MED ORDER — SODIUM CHLORIDE 0.9 % IV BOLUS
1000.0000 mL | Freq: Once | INTRAVENOUS | Status: AC
  Administered 2022-11-04: 1000 mL via INTRAVENOUS

## 2022-11-04 MED ORDER — OXYCODONE HCL 5 MG PO TABS
5.0000 mg | ORAL_TABLET | Freq: Once | ORAL | Status: AC
  Administered 2022-11-04: 5 mg via ORAL
  Filled 2022-11-04: qty 1

## 2022-11-04 MED ORDER — IOHEXOL 300 MG/ML  SOLN
100.0000 mL | Freq: Once | INTRAMUSCULAR | Status: AC | PRN
  Administered 2022-11-04: 100 mL via INTRAVENOUS

## 2022-11-04 NOTE — Discharge Instructions (Addendum)
The bleeding from his urine is most likely related to the stent or the known bladder mass.  His blood levels are stable and he is still able to urinate.  If he stops urinating this is concerning for retention and will need to return to the ER or if he develops weakness or symptoms of low blood levels he can always return for a recheck hemoglobin.  Otherwise he can have this followed up with his oncology team or the urology team for repeat evaluation, repeat hemoglobin.  We have sent it for urine culture to see if there is any evidence of UTI and we will call you if there is concern for UTI that need to be started antibiotics.     1. No definite acute abnormality within the abdomen or pelvis. 2. Right renal/ureteral findings are similar to the recent prior CT. There is moderate right hydronephrosis with infiltrative appearing soft tissue adjacent to the right renal pelvis and proximal ureter. A right ureteral stent is well positioned extending from the right renal collecting system to the posterior bladder. 3. Posterior bladder wall irregularity. Dependent 1.5 cm bladder stone. These findings are also similar to the prior CT. 4. Focal areas of pericolonic low-attenuation/low-attenuation nodular wall thickening of the proximal sigmoid colon is similar to the prior CT scan, reportedly known colon carcinoma. No bowel inflammation. 5. Mild increase in size of several pelvic and inguinal lymph nodes, with the largest left inguinal lymph nodes stable from the prior CT.

## 2022-11-04 NOTE — ED Triage Notes (Signed)
Pt to ED via [facility] from [facility]. Pt reports noticing bleeding from penis today. Pt also reports feels like he has the urge to urinate but can't. Pt is on blood thinners.

## 2022-11-04 NOTE — ED Notes (Signed)
First Nurse Note: Patient to ED via [facility] from [facility]. Patient sent due to bleeding from penis- first noticed today when changing his brief. Patient does take blood thinners. Denies pain. VS WNL.

## 2022-11-04 NOTE — ED Notes (Signed)
Blue top sent to lab. 

## 2022-11-04 NOTE — ED Notes (Signed)
Report given to Shanda Bumps at [facility].

## 2022-11-04 NOTE — ED Notes (Signed)
Bladder scan 25mL's

## 2022-11-04 NOTE — ED Notes (Signed)
[facility] arrived to transport patient. Pt stable at time of departure.

## 2022-11-04 NOTE — ED Notes (Signed)
Pt demanding to be off the monitor. Demanding to have IV taken out and to sit on the side of the bed. This RN took IV out and helped patient to sit on the side of the bed with call bell in reach.

## 2022-11-04 NOTE — ED Notes (Signed)
Facility and son unable to provide transport back to facility.

## 2022-11-04 NOTE — ED Provider Notes (Signed)
   [facility] Provider Note    Event Date/Time   First MD Initiated Contact with Patient 11/04/22 1308     (approximate)  History   Chief Complaint: Hematuria  HPI  Chad [name] is a 62 y.o. male with a past medical history anemia, anxiety, COPD, CKD, presents to the emergency department for hematuria.  According to the patient report they have noted blood coming from the patient's penis he states when he urinates he will occasionally see a small blood clot.  Denies any significant pain with urination although states sometimes the clots will hurt when he urinates.  Denies any significant abdominal pain although states mild lower abdominal discomfort.  No fever.  Physical Exam   Triage Vital Signs: ED Triage Vitals  Enc Vitals Group     BP 11/04/22 1214 111/86     Pulse Rate 11/04/22 1211 90     Resp 11/04/22 1211 18     Temp 11/04/22 1211 98.3 F (36.8 C)     Temp Source 11/04/22 1211 Oral     SpO2 11/04/22 1211 98 %     Weight --      Height --      Head Circumference --      Peak Flow --      Pain Score 11/04/22 1214 0     Pain Loc --      Pain Edu? --      Excl. in GC? --     Most recent vital signs: Vitals:   11/04/22 1211 11/04/22 1214  BP:  111/86  Pulse: 90   Resp: 18   Temp: 98.3 F (36.8 C)   SpO2: 98%     General: Awake, no distress.  CV:  Good peripheral perfusion.  Regular rate and rhythm  Resp:  Normal effort.  Equal breath sounds bilaterally.  Abd:  No distention.  Soft, mild suprapubic tenderness otherwise benign abdomen no rebound or guarding  ED Results / Procedures / Treatments     RADIOLOGY  CT pending   MEDICATIONS ORDERED IN ED: Medications  sodium chloride 0.9 % bolus 1,000 mL (has no administration in time range)     IMPRESSION / MDM / ASSESSMENT AND PLAN / ED COURSE  I reviewed the triage vital signs and the nursing notes.  Patient's presentation is most consistent with acute presentation with  potential threat to life or bodily function.  Patient presents to the emergency department for hematuria.  Patient per record review of oncology progress note 10/09/2022 has a history of right renal pelvis/ureteral carcinoma diagnosed 2019, also diagnosed with colon cancer status post partial colectomy now with new findings of the sigmoid colon per recent surgery note concerning for possible recurrence.  Overall patient does not appear to be in any distress he is thin/cachectic, mild suprapubic tenderness otherwise benign abdomen.  CBC is reassuring with a normal H&H normal white blood cell count, chemistry is reassuring.  Will obtain a urine sample.  Will IV hydrate and obtain a CT scan with contrast to further evaluate.  Patient appears to have followed up with Dr. Apolinar Junes in the past for urology as well as Dr. Donneta Romberg for oncology.  Urinalysis and CT scan are pending.  Patient care signed out to oncoming provider.  FINAL CLINICAL IMPRESSION(S) / ED DIAGNOSES   Hematuria   Note:  This document was prepared using Dragon voice recognition software and may include unintentional dictation errors.   Minna Antis, MD 11/04/22 1517

## 2022-11-04 NOTE — ED Provider Notes (Addendum)
4:18 PM Assumed care for off going team.   Blood pressure 131/86, pulse 95, temperature 98.3 F (36.8 C), temperature source Oral, resp. rate 18, SpO2 98 %.  See their HPI for full report but in brief pending CT   Post void bladder scan is normal CBC shows stable hemoglobin.  Platelets slightly low but similar to prior BMP shows stable creatinine.  CT imaging has some incidental findings that looks similar to prior CT imaging.  UA with blood noted in it and difficult to tell if UTI given blood.   Discussed CT imaging with patient.  Patient completed able to continue to urinate well although it is bloody in nature I did discuss with urology Dr Laurine Blazer hold off on any antibiotics and which is cultured at this time given patient is afebrile and has no fever.  He suspects the blood is related to the mass versus the stent.  He can follow-up outpatient with urology.  I discussed with patient return precautions in regards to the above and he expressed understanding and felt comfortable with discharge home   I called patient's son and updated him on the findings on the CT imaging he expressed understanding and felt comfortable with patient being sent back to facility        Concha Se, MD 11/04/22 1746    Concha Se, MD 11/04/22 [phone]

## 2022-11-05 ENCOUNTER — Other Ambulatory Visit (INDEPENDENT_AMBULATORY_CARE_PROVIDER_SITE_OTHER): Payer: Self-pay | Admitting: Nurse Practitioner

## 2022-11-05 ENCOUNTER — Encounter (INDEPENDENT_AMBULATORY_CARE_PROVIDER_SITE_OTHER): Payer: Self-pay | Admitting: Nurse Practitioner

## 2022-11-05 ENCOUNTER — Ambulatory Visit (INDEPENDENT_AMBULATORY_CARE_PROVIDER_SITE_OTHER): Payer: Medicaid Other | Admitting: Nurse Practitioner

## 2022-11-05 ENCOUNTER — Ambulatory Visit (INDEPENDENT_AMBULATORY_CARE_PROVIDER_SITE_OTHER): Payer: Medicaid Other

## 2022-11-05 VITALS — BP 117/75 | HR 88 | Resp 18 | Ht 67.0 in | Wt 160.0 lb

## 2022-11-05 DIAGNOSIS — E43 Unspecified severe protein-calorie malnutrition: Secondary | ICD-10-CM

## 2022-11-05 DIAGNOSIS — L97322 Non-pressure chronic ulcer of left ankle with fat layer exposed: Secondary | ICD-10-CM

## 2022-11-05 DIAGNOSIS — R6889 Other general symptoms and signs: Secondary | ICD-10-CM

## 2022-11-05 DIAGNOSIS — L89613 Pressure ulcer of right heel, stage 3: Secondary | ICD-10-CM

## 2022-11-05 DIAGNOSIS — Z9889 Other specified postprocedural states: Secondary | ICD-10-CM

## 2022-11-05 DIAGNOSIS — I739 Peripheral vascular disease, unspecified: Secondary | ICD-10-CM

## 2022-11-05 DIAGNOSIS — Z85038 Personal history of other malignant neoplasm of large intestine: Secondary | ICD-10-CM

## 2022-11-05 DIAGNOSIS — L97909 Non-pressure chronic ulcer of unspecified part of unspecified lower leg with unspecified severity: Secondary | ICD-10-CM

## 2022-11-06 ENCOUNTER — Encounter (INDEPENDENT_AMBULATORY_CARE_PROVIDER_SITE_OTHER): Payer: Self-pay | Admitting: Nurse Practitioner

## 2022-11-06 ENCOUNTER — Encounter: Payer: Medicaid Other | Admitting: Physician Assistant

## 2022-11-06 DIAGNOSIS — L97322 Non-pressure chronic ulcer of left ankle with fat layer exposed: Secondary | ICD-10-CM | POA: Diagnosis not present

## 2022-11-06 NOTE — Progress Notes (Addendum)
Chad [name], Chad [name] ([id number]) [id number] Page 1 of 16 Visit Report for 11/06/2022 Chief Complaint Document Details Patient Name: Date of Service: Chad [name], Chad [name] 11/06/2022 8:30 A M Medical Record Number: [id number] Patient Account Number: [id number] Date of Birth/Sex: Treating RN: 12/05/1960 (62 y.o. [name] Primary Care Provider: [name] Other Clinician: Referring Provider: Treating Provider/Extender: [name] Weeks in Treatment: 109 Information Obtained from: Patient Chief Complaint Left ankle ulcer, Left dorsal foot wound, right heel Electronic Signature(s) Signed: 11/06/2022 8:18:37 AM By: [name] PA-C Entered By: [name] on 11/06/2022 08:18:36 -------------------------------------------------------------------------------- Debridement Details Patient Name: Date of Service: Chad [name] 11/06/2022 8:30 A M Medical Record Number: [id number] Patient Account Number: [id number] Date of Birth/Sex: Treating RN: 1961-04-11 (62 y.o. [name] Primary Care Provider: [name] Other Clinician: Referring Provider: Treating Provider/Extender: [name] in Treatment: 47 Debridement Performed for Assessment: Wound #15 Left,Dorsal Foot Performed By: Physician [name], PA-C Debridement Type: Chemical/Enzymatic/Mechanical Agent Used: Santyl Severity of Tissue Pre Debridement: Fat layer exposed Level of Consciousness (Pre-procedure): Awake and Alert Pre-procedure Verification/Time Out Yes - 09:00 Taken: Start Time: 09:00 Pain Control: Lidocaine 4% T opical Solution Instrument: Other : santyl Bleeding: None Response to Treatment: Procedure was tolerated well Level of Consciousness (Post- Awake and Alert procedure): Post Debridement Measurements of Total Wound Length: (cm) 3.7 Width: (cm) 0.7 Depth: (cm) 0.2 Volume: (cm) 0.407 Character of Wound/Ulcer Post Debridement:  Stable Severity of Tissue Post Debridement: Fat layer exposed Post Procedure Diagnosis Same as Pre-procedure Electronic Signature(s) Signed: 11/06/2022 10:26:23 PM By: [name] PA-C Signed: 11/07/2022 12:55:54 PM By: Midge Aver MSN RN CNS WTA Entered By: Midge Aver on 11/06/2022 09:20:51 -------------------------------------------------------------------------------- Debridement Details Patient Name: Date of Service: Chad [name] 11/06/2022 8:30 A Adrienne Mocha ([id number]) [id number] Page 2 of 16 Medical Record Number: [id number] Patient Account Number: [id number] Date of Birth/Sex: Treating RN: 1960-11-18 (62 y.o. [name] Primary Care Provider: [name] Other Clinician: Referring Provider: Treating Provider/Extender: [name] Weeks in Treatment: 47 Debridement Performed for Assessment: Wound #16 Right Calcaneus Performed By: Physician [name], PA-C Debridement Type: Chemical/Enzymatic/Mechanical Agent Used: Santyl Level of Consciousness (Pre-procedure): Awake and Alert Pre-procedure Verification/Time Out Yes - 09:00 Taken: Start Time: 09:00 Pain Control: Lidocaine 4% T opical Solution Instrument: Other : santyl Bleeding: None Response to Treatment: Procedure was tolerated well Level of Consciousness (Post- Awake and Alert procedure): Post Debridement Measurements of Total Wound Length: (cm) 1.3 Stage: Category/Stage III Width: (cm) 0.8 Depth: (cm) 0.1 Volume: (cm) 0.082 Character of Wound/Ulcer Post Debridement: Stable Post Procedure Diagnosis Same as Pre-procedure Electronic Signature(s) Signed: 11/06/2022 10:26:23 PM By: [name] PA-C Signed: 11/07/2022 12:55:54 PM By: Midge Aver MSN RN CNS WTA Entered By: Midge Aver on 11/06/2022 09:21:21 -------------------------------------------------------------------------------- Debridement Details Patient Name: Date of Service: Chad Cox  [name] 11/06/2022 8:30 A M Medical Record Number: [id number] Patient Account Number: [id number] Date of Birth/Sex: Treating RN: 03-13-1961 (62 y.o. [name] Primary Care Provider: [name] Other Clinician: Referring Provider: Treating Provider/Extender: [name] in Treatment: 47 Debridement Performed for Assessment: Wound #17 Left,Anterior Lower Leg Performed By: Physician [name], PA-C Debridement Type: Chemical/Enzymatic/Mechanical Agent Used: Santyl Level of Consciousness (Pre-procedure): Awake and Alert Pre-procedure Verification/Time Out Yes - 09:00 Taken: Start Time: 09:00 Pain Control: Lidocaine 4% T opical Solution Instrument: Other : santyl Bleeding: None Response to Treatment: Procedure was tolerated well Level of Consciousness (Post- Awake and Alert procedure): Post Debridement  Measurements of Total Wound Length: (cm) 3.2 Stage: Category/Stage II Width: (cm) 2 Depth: (cm) 0.3 Volume: (cm) 1.508 Character of Wound/Ulcer Post Debridement: Stable Post Procedure Diagnosis Same as Pre-procedure Electronic Signature(s) Signed: 11/06/2022 10:26:23 PM By: [name] PA-C Signed: 11/07/2022 12:55:54 PM By: Midge Aver MSN RN CNS Darnelle Going, [name] ([id number]) PM By: Midge Aver MSN RN CNS Margarita Rana [phone].pdf Page 3 of 16 Signed: 11/07/2022 12:55:54 Entered By: Midge Aver on 11/06/2022 09:22:02 -------------------------------------------------------------------------------- Debridement Details Patient Name: Date of Service: Chad [name], Chad [name] 11/06/2022 8:30 A M Medical Record Number: [id number] Patient Account Number: [id number] Date of Birth/Sex: Treating RN: [birth year] (62 y.o. [name] Primary Care Provider: [name] Other Clinician: Referring Provider: Treating Provider/Extender: [name] Weeks in Treatment: 47 Debridement Performed for Assessment: Wound #18 Right,Medial T  Second oe Performed By: Physician [name], PA-C Debridement Type: Chemical/Enzymatic/Mechanical Agent Used: Santyl Level of Consciousness (Pre-procedure): Awake and Alert Pre-procedure Verification/Time Out Yes - 09:00 Taken: Start Time: 09:00 Pain Control: Lidocaine 4% T opical Solution Instrument: Other : santyl Bleeding: None Response to Treatment: Procedure was tolerated well Level of Consciousness (Post- Awake and Alert procedure): Post Debridement Measurements of Total Wound Length: (cm) 1 Stage: Category/Stage II Width: (cm) 0.8 Depth: (cm) 0.1 Volume: (cm) 0.063 Character of Wound/Ulcer Post Debridement: Stable Post Procedure Diagnosis Same as Pre-procedure Electronic Signature(s) Signed: 11/06/2022 10:26:23 PM By: [name] PA-C Signed: 11/07/2022 12:55:54 PM By: Midge Aver MSN RN CNS WTA Entered By: Midge Aver on 11/06/2022 09:22:29 -------------------------------------------------------------------------------- HPI Details Patient Name: Date of Service: Chad [name] 11/06/2022 8:30 A M Medical Record Number: [id number] Patient Account Number: [id number] Date of Birth/Sex: Treating RN: May 11, 1961 (62 y.o. [name] Primary Care Provider: [name] Other Clinician: Referring Provider: Treating Provider/Extender: [name] in Treatment: 5 History of Present Illness HPI Description: 10/08/18 on evaluation today patient actually presents to our office for initial evaluation concerning wounds that he has of the bilateral lower extremities. 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 He also has bilateral venous stasis. He apparently has been homeless currently following discharge from the hospital apparently he has been placed at [facility] which is is a skilled  nursing facility locally. Nonetheless fortunately he does not show any signs of infection at this time which is good news. In fact several of the wound actually appears to be showing some signs of improvement already in my pinion. There are a couple areas in the left leg in particular there likely gonna require some sharp debridement to help clear away some necrotic tissue and help with more sufficient healing. No fevers, chills, nausea, or vomiting noted at this time. 10/15/18 on evaluation today patient actually appears to be doing very well in regard to his bilateral lower extremities. He's been tolerating the dressing changes without complication. Fortunately there does not appear to be any evidence of active infection at this time which is great news. Overall I'm actually very pleased with how this has progressed in just one visits time. Readmission: 08/14/2020 upon evaluation today patient presents for re-evaluation here in our clinic. He is having issues with his left ankle region as well as his right toe and his right heel. He tells me that the toe and heel actually began as a area that was itching that he was scratching and then  subsequently opened up into wounds. These may have been abscess areas I presume based on what I am seeing currently. With regard to his left ankle region he tells me this was a similar type occurrence although he does have venous stasis this very well may be more of a venous leg ulcer more than anything. Nonetheless I do believe that the patient would benefit from appropriate and aggressive wound care to try to help get things under better control here. He does have history of a stroke on the [facility] ([id number]) [id number] Page 4 of 16 left side affecting him to some degree there that he is able to stand although he does have some residual weakness. Otherwise again the patient does have chronic venous insufficiency as previously  noted. His arterial studies most recently obtained showed that he had an ABI on the right of 1.16 with a TBI of 0.52 and on the left and ABI of 1.14 with a TBI of 0.81. That was obtained on 06/19/2020. 08/28/2020 upon evaluation today patient appears to be doing decently well in regard to his wounds in general. He has been tolerating the dressing changes without complication. Fortunately there does not appear to be any signs of active infection which is great news. With that being said I think the [facility] is doing a good job I would recommend that we likely continue with that currently. 09/11/2020 upon evaluation today patient's wounds did not appear to be doing too poorly but again he is not really showing signs of significant improvement with regard to any of the wounds on the right. None of them have Hydrofera Blue on them I am not exactly sure why this is not being followed as the facility did not contact us to let us know of any issues with obtaining dressings or otherwise. With that being said he is supposed to be using Hydrofera Blue on both of the wounds on the right foot as well as the ankle wound on the left side. 09/18/2020 upon evaluation today patient appears to be doing poorly with regard to his wounds. Again right now the left ankle in particular showed signs of extreme maceration. Apparently he was told by someone with staff at [facility] they could not get the [facility]. With that being said this is something that is never been relayed to [place] one way or another. Also the patient subsequently has not supposed to have a border gauze dressing on. He should have an ABD pad and roll gauze to secure as this drains much too much just to have a border gauze dressing to cover. Nonetheless the fact that they are not using the appropriate dressing is directly causing deterioration of the left ankle wound it is significantly worse today compared to what it was previous. I  did attempt to call [facility] while the patient was here I called three times and got no one to even pick up the phone. After this I had my for an office coordinator call and she was able to finally get through and leave a message with the D ON as of dictation of this note which is roughly about an hour and a half later I still have not been able to speak with anyone at the facility. 09/25/2020 upon evaluation today patient actually showing signs of good improvement which is excellent news. He has been tolerating the dressing changes without complication. Fortunately there is no signs of active infection which is great news. No fevers, chills, nausea,  vomiting, or diarrhea. I do feel like the facility has been doing a much better job at taking care of him as far as the dressings are concerned. However the director of nursing never did call me back. 10/09/2020 upon evaluation today patient appears to be doing well with regard to his wound. The toe ulcer did require some debridement but the other 2 areas actually appear to be doing quite well. 10/19/2020 upon evaluation today patient actually appears to be doing very well in regard to his wounds. In fact the heel does appear to be completely healed. The toe is doing better in the medial ankle on the left is also doing better. Overall I think he is headed in the right direction. 10/26/2020 upon evaluation today patient appears to be doing well with regard to his wound. He is showing signs of improvement which is great news and overall I am very pleased with where things stand today. No fevers, chills, nausea, vomiting, or diarrhea. 11/02/2020 upon evaluation today patient appears to be doing well with regard to his wounds. He has been tolerating the dressing changes without complication overall I am extremely pleased with where things stand today. He in regard to the toe is almost completely healed and the medial ankle on the left is doing much  better. 11/09/2020 upon evaluation today patient appears to be doing a little poorly in regard to his left medial ankle ulcer. Fortunately there does not appear to be any signs of systemic infection but unfortunately locally he does appear to be infected in fact he has blue-green drainage consistent with Pseudomonas. 11/16/2020 upon evaluation today patient appears to be doing well with regard to his wound. It actually appears to be doing better. I did place him on gentamicin cream since the Cipro was actually resistant even though he was positive for Pseudomonas on culture. Overall I think that he does seem to be doing better though I am unsure whether or not they have actually been putting the cream on. The patient is not sure that we did talk to the nurse directly and she was going to initiate that treatment. Fortunately there does not appear to be any signs of active infection at this time. No fevers, chills, nausea, vomiting, or diarrhea. 4/28; the area on the right second toe is close to healed. Left medial ankle required debridement 12/07/2020 upon evaluation today patient appears to be doing well with regard to his wounds. In fact the right second toe appears to be completely healed which is great news. Fortunately there does not appear to be any signs of active infection at this time which is also great news. I think we can probably discontinue the gentamicin on top of everything else. 12/14/2020 upon evaluation today patient appears to be doing well with regard to his wound. He is making good progress and overall very pleased with where things stand today. There is no signs of active infection at this time which is great news. 12/28/2020 upon evaluation today patient appears to be doing well with regard to his wounds. He has been tolerating the dressing changes without complication. Fortunately there is no signs of active infection at this time. No fevers, chills, nausea, vomiting, or  diarrhea. 12/28/2020 upon evaluation today patient's wound bed actually showed signs of excellent improvement. He has great epithelization and granulation I do not see any signs of infection overall I am extremely pleased with where things stand at this point. No fevers, chills, nausea, vomiting, or diarrhea. 01/11/2021  upon evaluation today patient appears to be doing well with regard to his wound on his leg. He has been tolerating the dressing changes without complication. Fortunately there does not appear to be any signs of active infection which is great news. No fevers, chills, nausea, vomiting, or diarrhea. 01/25/2021 upon evaluation today patient appears to be doing well with regard to his wound. He has been tolerating the dressing changes without complication. Fortunately the collagen seems to be doing a great job which is excellent news. No fevers, chills, nausea, vomiting, or diarrhea. 02/08/2021 upon evaluation today patient's wound is actually looking a little bit worse especially in the periwound compared to previous. Fortunately there does not appear to be any signs of infection which is great news with that being said he does have some irritation around the periphery of the wound which has me more concerned. He actually had a dressing on that had not been changed in 3 days. He also is supposed to have daily dressing changes. With regard to the dressing applied he had a silver alginate dressing and silver collagen is what is recommended and ordered. He also had no Desitin around the edges of the wound in the periwound region although that is on the order inspect to be done as well. In general I was very concerned I did contact [facility] actually spoke with Verlon Au who is the scheduling individual and subsequently she stated that she would pass the information to the D ON apparently the D ON was not available to talk to me when I call today. 02/18/2021 upon evaluation today patient's  wound is actually showing signs of improvement. Fortunately there does not appear to be any evidence of infection which is great news overall I am extremely pleased with where things stand today. No fevers, chills, nausea, vomiting, or diarrhea. 8/3; patient presents for 1 week follow-up. He has no issues or complaints today. He denies signs of infection. 03/11/2021 upon evaluation today patient appears to be doing well with regard to his wound. He does have a little bit of slough noted on the surface of the wound but fortunately there does not appear to be any signs of active infection at this time. No fevers, chills, nausea, vomiting, or diarrhea. 03/18/2021 upon evaluation today patient appears to be doing well with regard to his wound. He has been tolerating the dressing changes without complication. There was a little irritation more proximal to where the wound was that was not noted last week but nonetheless this is very superficial just seems to be more irritation we just need to make sure to put a good amount of the zinc over the area in my opinion. Otherwise he does not seem to be doing significantly worse at all which is great news. 03/25/2021 upon evaluation today patient appears to be doing well with regard to his wound. He is going require some sharp debridement today to clear with some of the necrotic debris. I did perform this today without complication postdebridement wound bed appears to be doing much better this is great news. Chad [name], Chad [name] ([id number]) [id number] Page 5 of 16 04/08/2021 upon evaluation today patient appears to be doing decently well in regard to his wound although the overall measurement is not significantly smaller compared to previous. It is gone down a little bit but still the facility continues to not really put the appropriate dressings in place in fact he was supposed to have collagen we think he probably had more of an allergy  to At  this point. Fortunately there does not appear to be any signs of active infection systemically though locally I do not see anything on initial visualization either as far as erythema or warmth. 04/15/2021 upon evaluation today patient appears to be doing well with regard to his wound. He is actually showing signs of improvement. I did place him on antibiotics last week, Cipro. He has been taking that 2 times a day and seems to be tolerating it very well. I do not see any evidence of worsening and in fact the overall appearance of the wound is smaller today which is also great news. 9/26; left medial ankle chronic venous insufficiency wound is improved. Using Hydrofera Blue 10/10; left medial ankle chronic venous insufficiency. Wound has not changed much in appearance completely nonviable surface. Apparently there have been problems getting the right product on the wound at the facility although he came in with [facility] on today 05/14/2021 upon evaluation today patient appears to be doing well with regard to his wound. I think he is making progress here which is good news. Fortunately there does not appear to be any signs of active infection at this time. No fevers, chills, nausea, vomiting, or diarrhea. 05/20/2021 upon evaluation today patient appears to be doing well with regard to his wound. He is showing signs of good improvement which is great news. There does not appear to be any evidence of active infection which is also excellent news. No fevers, chills, nausea, vomiting, or diarrhea. 05/28/2021 upon evaluation today patient appears to be doing quite well. There does not appear to be any signs of active infection at this time which is great news. Overall I am extremely pleased with where things stand today. I think he is headed in the right direction. 06/11/2021 upon evaluation today patient appears to be doing well with regard to his left ankle ulcer and poorly in regard to the toe ulcer on  the second toe right foot. This appears to show signs of joint exposure. Apparently this has been present for 1 to 2 months although he kept forgetting to tell me about it. That is unfortunate as right now it definitely appears to be doing significantly worse than what I would like to see. There does not appear to be any signs of active infection systemically though locally I am concerned about the possibility of infection the toe is quite red. Again no one from the facility ever contacted [place] to advise that this was going on in the interim either. 06/17/2021 upon evaluation today patient presents for follow-up I did review his x-ray which showed a navicular bone fracture I am unsure of the chronicity of this. Subsequently he also had osteomyelitis of the toe which was what I was more concerned about this did not show up on x-ray but did show up on the pathology scrapings. This was listed as acute osteomyelitis. Nonetheless at this point I think that the antibiotic treatment is the best regimen to go with currently. The patient is in agreement with that plan. Nonetheless he has initially 30 days of doxycycline off likely extend that towards the end of the treatment cycle that will be around the middle of December for an additional 2 weeks. That all depends on how well he continues to heal. Nonetheless based on what I am seeing in the foot I did want a proceed with an MRI as well which I think will be helpful to identify if there is anything else that  needs to be addressed from the standpoint of infection. 06/24/2021 upon evaluation today patient appears to be doing pretty well in regards to his wounds. I think both are actually showing signs of improvement which is good I did review his MRI today which did show signs of osteomyelitis of the middle and proximal phalanx on his right foot of the affected toe. With that being said this is actually showing signs of significant improvement today already with  the antibiotic therapy I think the redness is also improved. Overall I think that we just need to give this some time with appropriate wound care we will see how things go potentially hyperbarics could be considered. 07/02/2021 upon inspection today patient actually appears to be doing well in regard to his left ankle which is getting very close to complete resolution of pleased in that regard. Unfortunately he is continuing to have issues with his second toe right foot and this seems to still be very painful for him. Recommend he try something different from the standpoint of antibiotics. 07/15/2021 upon evaluation today patient appears to be doing actually pretty well in regard to his foot. This is actually showing signs of significant improvement which is great news. Overall I feel like the patient is improving both in regard to the second toe as well as the ankle on the left. With that being said the biggest issue that I do see currently is that he is needing to have a refill of the doxycycline that we previously treated him with. He also did see podiatry they are not going to recommend any amputation at this point since he seems to be doing quite well. For that reason we just need to keep things under control from an infection standpoint. 08/01/2021 upon evaluation today patient appears to be doing well with regard to his wound. He has been tolerating the dressing changes without complication. Fortunately there does not appear to be any evidence of active infection locally nor systemically at this point. In fact I think everything is doing excellent in fact his second toe on the right foot is almost healed and the ankle on the left ankle region is actually very close to being healed as well. 08/08/2021 upon evaluation today patient appears to be doing well with regard to his wound. He has been tolerating the dressing changes without complication. Fortunately I do not see any signs of active infection  at this time. Readmission: 12-06-2021 upon evaluation today patient presents for reevaluation here in the clinic he does tell me that he was being seen in facility at [facility] by a provider that was coming in. He is not sure who this was. He tells me however that the wound seems to have gotten worse even compared to where it was when we last saw him at this point. With that being said I do believe that he is likely going need ongoing wound care here in the clinic and I do believe that we need to be the ones to frontline this since his wound does seem to be getting worse not better at this point. He voiced understanding. He is also in agreement with this plan and feels more comfortable coming here she tells me. Patient's medical history really has not changed since his prior admission he was only gone since January. 12-27-2021 upon evaluation today patient appears to be doing well with regard to his wound they did run out of the [facility] so they did not put anything on just  an ABD pad with gentamicin. Still we are seeing some signs of good improvement here with some new epithelization which is great news. 01-10-2022 upon evaluation today patient appears to be doing well with regard to his wounds and he is going require some sharp debridement but overall seems to be making good progress. Fortunately I do not see any evidence of active infection locally or systemically at this time which is great news. 01-24-2022 upon evaluation today patient appears to be doing well with regard to his wound. The facility actually came and dropped him off early and he had another appointment at the hospital and then they just brought him over here and this was still hours before his appointment this afternoon. For that reason we did do our best to work him in this morning and fortunately had some space to make this happen. With that being said patient's wound does seem to be making progress here and I am very  pleased in that regard I do not see any signs of active infection locally or systemically at this time. 02-07-2022 patient appears to be doing well currently in regard to his wounds. In fact one of them the more proximal is healed the distal is still open but seems to be doing excellent. Fortunately I do not see any evidence of active infection locally or systemically at this time which is great news. No fevers, chills, nausea, vomiting, or diarrhea. 7/27; left medial ankle venous. Improving per our intake nurse. We are using Hydrofera Blue under Tubigrip compression. They are changing that at his facility 03-06-2022 upon evaluation today patient appears to be doing well with regard to the his wound he is can require some sharp debridement but seems to be making excellent progress. Fortunately I do not see any evidence of active infection locally or systemically which is great news. 03-27-2022 upon evaluation today patient's wound is actually showing signs of excellent improvement. Fortunately I see no signs of active infection locally or systemically at this time which is great news. No fevers, chills, nausea, vomiting, or diarrhea. 04-21-2022 upon evaluation today patient appears to be doing excellent in regard to his wound in fact this is very close to resolution based on what I am seeing. Chad [name], Chad [name] ([id number]) [id number] Page 6 of 16 I do not see any evidence of active infection locally or systemically at this time which is great news and overall I am extremely pleased with where we are today. 05-05-2022 upon evaluation today patient's wound actually appears to potentially be completely healed. Fortunately I do not see any evidence of active infection at this time which is great news and overall I am very pleased I think this needs a little time to toughen up but other than that I really do believe were doing quite well. He is very pleased to hear this its been a  long time coming. 05-19-2022 upon evaluation today patient appears to be doing well currently in regard to his wound. He in fact we were hoping will be completely healed and closed but it still has a very tiny area right in the center which is still continuing to drain. Fortunately I do not see any signs of infection locally or systemically which is great news. 11/6; this wound is close to completely" he comes in this week with some erythema at 1 edge of this which looks like something was rubbing on the area. Other than that no evidence of infection 06-16-2022 upon evaluation today patient appears to be  doing well currently in regard to his ankle ulcer. This is very close to being healed but still has a small area in the central portion which is not. Fortunately there does not appear to be any signs of infection locally or systemically which is great news. No fevers, chills, nausea, vomiting, or diarrhea. 06-30-2022 upon evaluation patient's wound pretty much appears to be almost completely closed. In fact it may even be closed but there are still a lot of skin irritation around the edges of the wound and I just do not feel great about releasing them yet I would like to continue to monitor this just a little bit longer before getting to that point. He is not opposed to this and in fact is happy to continue to come in if need be in order to make sure that things are moving in the right direction he definitely does not want to backtrack. 07-14-2022 upon evaluation today patient appears to be doing well currently in regard to his wound. Has been tolerating the dressing changes without complication. Fortunately I see no evidence of active infection locally nor systemically which is great news. No fevers, chills, nausea, vomiting, or diarrhea. 08-11-2022 upon evaluation today patient appears to be doing a little worse than last time I saw him although it has been a month. Unfortunately he was not able to  be seen due to the fact that he was quarantined in the facility with COVID. Subsequently he tells me that he mention to the facility staff several times about taking it off over the past several weeks but they continue to tell him that someone have to get in touch with [place] here although nobody ever did until Friday when the nurse manager called to have that documented in the communication notes. With that being said unfortunately this means that he had a wrap on that he was supposed to have on for only 1 week for ended up being on four 1 month essentially. I am glad that there is nothing worse than what we see currently to be perfectly honest. 08-19-2022 upon evaluation today patient appears to be doing well currently in regard to his wounds. He has been tolerating the dressing changes without complication. This is actually smaller today which is great news after last week's reopening. Fortunately I do not see any evidence of infection locally nor systemically at this point. 08-26-2022 upon evaluation today patient appears to be doing well currently in regard to his wound. He has been tolerating the dressing changes without complication. Fortunately there does not appear to be any signs of active infection locally nor systemically which is great news. No fevers, chills, nausea, vomiting, or diarrhea. 09-02-2022 upon evaluation today patient's wound actually showing signs of excellent improvement this is measuring smaller and looking much better. I am very pleased with where we stand I do believe that we are headed in the right direction. 09-09-2022 upon evaluation today patient appears to be doing decently well in regard to his leg in general although there was some swelling this is the 1 thing that I am not too happy with based on what I see currently. Fortunately there does not appear to be any signs of infection locally nor systemically at this point. 09-16-2022 upon evaluation today patient appears to  be doing well currently in regard to his wound. He is actually showing signs of improvement he wore the Tubigrip this week and it significantly better compared to last week's evaluation. Fortunately I do not  see any evidence of active infection locally or systemically which is great news. Overall I think that were headed in the right direction which is great news. In fact overall I think this is the best that he has looked in some time. He does not like the Tubigrip but actually did extremely well with this in my opinion. I think he needs to continue to utilize this. 09-23-2022 upon evaluation today patient appears to be doing well currently in regard to his wound. He has been tolerating the dressing changes without complication. Fortunately there does not appear to be any signs of active infection locally nor systemically which is great news and in general I do feel like that we are headed in the right direction. He has been having trouble with the Tubigrip however we have gotten the facility to order compression socks he has not gotten them as of yet. 3/4; patient is about the same with a wound on the left posterior leg. This is a venous wound. He needs more compression on this leg but he took off the previous wraps we are using. I am not certain whether he is using juxta lite stockings at the nursing home. 3/12; patient presents for follow-up. He unfortunately has 2 new wounds 1 to the dorsal left side and the other to the right heel. He is not sure how these happened. The right heel appears to be caused by pressure. He has been using Hydrofera Blue to the original wound on the left leg. 10-14-2022 upon evaluation today patient appears to be doing well currently in regard to his original wound but unfortunately he has several new wounds which I was completely unaware of before walking into the room to see him today. Unfortunately he seems to not be doing nearly as well as what I saw when I last  had an appointment with him 3 weeks ago. Fortunately I do not see any signs of systemic infection though unfortunately locally he has definite infection of the left lower extremity. He is currently on doxycycline I am going to likely have to extend that today. 10-21-2022 upon evaluation today patient appears to be doing well currently in regard to his wounds. He seems to be tolerating the dressing changes without complication. Fortunately there does not appear to be any signs of active infection locally or systemically which is great news and in general I definitely feel like we are on the right track here. 10-30-2022 upon evaluation today patient still continues to not be doing too well at all with regard to his wounds. Unfortunately he is still having signs of infection as well which also has been concerned. Fortunately I do not see any signs of active infection locally nor systemically at this time. No fevers, chills, nausea, vomiting, or diarrhea. 11-06-2022 upon evaluation today patient appears to be doing okay currently in regard to his wounds. All things considered with his blood flow not being nearly as good as what should I think that he is making pretty good progress here. Fortunately I do not see any signs of infection locally nor systemically which is great news. No fevers, chills, nausea, vomiting, or diarrhea. Electronic Signature(s) Signed: 11/06/2022 9:33:00 AM By: [name] PA-C Entered By: [name] on 11/06/2022 09:32:59 Chad [name] ([id number]) [id number] Page 7 of 16 -------------------------------------------------------------------------------- Physical Exam Details Patient Name: Date of Service: Chad [name], Chad [name] 11/06/2022 8:30 A M Medical Record Number: [id number] Patient Account Number: [id number] Date of Birth/Sex: Treating RN: 1961/03/23 (62 y.o. Dwan Bolt,  Chip Boer Primary Care Provider: [name] Other Clinician: Referring  Provider: Treating Provider/Extender: [name] Weeks in Treatment: 71 Constitutional Well-nourished and well-hydrated in no acute distress. Respiratory normal breathing without difficulty. Psychiatric this patient is able to make decisions and demonstrates good insight into disease process. Alert and Oriented x 3. pleasant and cooperative. Notes Upon inspection patient's wound bed actually showed signs of good granulation epithelization at this point. Fortunately I do not see any evidence of active infection locally nor systemically which is great news and overall I am extremely pleased with with where we stand currently. I think that he is doing well all things considered I did place him on a different medication, cefdinir, yesterday and that should be started at facility soon. Electronic Signature(s) Signed: 11/06/2022 9:33:28 AM By: [name] PA-C Entered By: [name] on 11/06/2022 09:33:27 -------------------------------------------------------------------------------- Physician Orders Details Patient Name: Date of Service: Chad [name], Chad [name] 11/06/2022 8:30 A M Medical Record Number: [id number] Patient Account Number: [id number] Date of Birth/Sex: Treating RN: Feb 24, 1961 (62 y.o. 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 Bathing/ Shower/ Hygiene May shower with wound dressing protected with water repellent cover or cast protector. No tub bath. Anesthetic (Use 'Patient Medications' Section for Anesthetic Order Entry) Lidocaine applied to wound bed Edema Control - Lymphedema / Segmental Compressive Device / Other Patient to wear own compression stockings. Remove compression stockings every night before going to bed and put on every morning when getting up. - please order patient compression stockings 8-59mm/hg ankle 23cm, calf 32cm, leg length floor to knee 40cm Elevate legs to the level of the heart and pump ankles as often as possible Elevate leg(s) parallel to the floor when sitting. Off-Loading Heel suspension boot - when in bed Medications-Please add to medication list. Other: - apply AandD ointment to leg, do not apply to wound Wound Treatment Chad [name] ([id number]) [id number] Page 8 of 16 Wound #12 - Ankle Wound Laterality: Left, Medial, Distal Cleanser: Soap and Water Every Other Day/30 Days Discharge Instructions: Gently cleanse wound with antibacterial soap, rinse and pat dry prior to dressing wounds Cleanser: Wound Cleanser Every Other Day/30 Days Discharge Instructions: Wash your hands with soap and water. Remove old dressing, discard into plastic bag and place into trash. Cleanse the wound with Wound Cleanser prior to applying a clean dressing using gauze sponges, not tissues or cotton balls. Do not scrub or use excessive force. Pat dry using gauze sponges, not tissue or cotton balls. Peri-Wound Care: AandD Ointment Every Other Day/30 Days Discharge Instructions: Apply AandD Ointment to areas of redness Prim Dressing: Hydrofera Blue Ready Transfer Foam, 4x5 (in/in) Every Other Day/30 Days ary Discharge Instructions: Apply Hydrofera Blue Ready to wound bed as directed Secondary Dressing: ABD Pad 5x9 (in/in) Every Other Day/30 Days Discharge  Instructions: Cover with ABD pad Secured With: Kerlix Roll Sterile or Non-Sterile 6-ply 4.5x4 (yd/yd) Every Other Day/30 Days Discharge Instructions: Apply Kerlix as directed Wound #15 - Foot Wound Laterality: Dorsal,  Left Cleanser: Soap and Water Every Other Day/30 Days Discharge Instructions: Gently cleanse wound with antibacterial soap, rinse and pat dry prior to dressing wounds Cleanser: Wound Cleanser Every Other Day/30 Days Discharge Instructions: Wash your hands with soap and water. Remove old dressing, discard into plastic bag and place into trash. Cleanse the wound with Wound Cleanser prior to applying a clean dressing using gauze sponges, not tissues or cotton balls. Do not scrub or use excessive force. Pat dry using gauze sponges, not tissue or cotton balls. Peri-Wound Care: AandD Ointment Every Other Day/30 Days Discharge Instructions: Apply AandD Ointment to areas of redness Topical: Santyl Collagenase Ointment, 30 (gm), tube Every Other Day/30 Days Discharge Instructions: apply nickel thick to wound bed only Prim Dressing: Gauze Every Other Day/30 Days ary Discharge Instructions: As directed: dry, moistened with saline or moistened with Dakins Solution Secondary Dressing: ABD Pad 5x9 (in/in) Every Other Day/30 Days Discharge Instructions: Cover with ABD pad Secured With: Kerlix Roll Sterile or Non-Sterile 6-ply 4.5x4 (yd/yd) Every Other Day/30 Days Discharge Instructions: Apply Kerlix as directed Wound #16 - Calcaneus Wound Laterality: Right Cleanser: Soap and Water Every Other Day/30 Days Discharge Instructions: Gently cleanse wound with antibacterial soap, rinse and pat dry prior to dressing wounds Cleanser: Wound Cleanser Every Other Day/30 Days Discharge Instructions: Wash your hands with soap and water. Remove old dressing, discard into plastic bag and place into trash. Cleanse the wound with Wound Cleanser prior to applying a clean dressing using gauze sponges, not  tissues or cotton balls. Do not scrub or use excessive force. Pat dry using gauze sponges, not tissue or cotton balls. Peri-Wound Care: AandD Ointment Every Other Day/30 Days Discharge Instructions: Apply AandD Ointment to areas of redness Topical: Santyl Collagenase Ointment, 30 (gm), tube Every Other Day/30 Days Discharge Instructions: apply nickel thick to wound bed only Prim Dressing: Gauze Every Other Day/30 Days ary Discharge Instructions: As directed: dry, moistened with saline or moistened with Dakins Solution Secondary Dressing: ABD Pad 5x9 (in/in) Every Other Day/30 Days Discharge Instructions: Cover with ABD pad Secured With: Kerlix Roll Sterile or Non-Sterile 6-ply 4.5x4 (yd/yd) Every Other Day/30 Days Discharge Instructions: Apply Kerlix as directed Wound #17 - Lower Leg Wound Laterality: Left, Anterior Cleanser: Soap and Water Every Other Day/30 Days Discharge Instructions: Gently cleanse wound with antibacterial soap, rinse and pat dry prior to dressing wounds Cleanser: Wound Cleanser Every Other Day/30 Days Discharge Instructions: Wash your hands with soap and water. Remove old dressing, discard into plastic bag and place into trash. Cleanse the wound with Wound Cleanser prior to applying a clean dressing using gauze sponges, not tissues or cotton balls. Do not scrub or use excessive force. Pat dry using gauze sponges, not tissue or cotton balls. Chad [name], Chad [name] ([id number]) [id number] Page 9 of 16 Peri-Wound Care: AandD Ointment Every Other Day/30 Days Discharge Instructions: Apply AandD Ointment to areas of redness Topical: Santyl Collagenase Ointment, 30 (gm), tube Every Other Day/30 Days Discharge Instructions: apply nickel thick to wound bed only Prim Dressing: Algicell Calcium Alginate Dressing, 4X5 (in/in) ary Every Other Day/30 Days Secondary Dressing: ABD Pad 5x9 (in/in) Every Other Day/30 Days Discharge Instructions: Cover with ABD  pad Secured With: Kerlix Roll Sterile or Non-Sterile 6-ply 4.5x4 (yd/yd) Every Other Day/30 Days Discharge Instructions: Apply Kerlix as directed Wound #18 - T Second oe Wound Laterality: Right, Medial Cleanser: Soap and Water Every Other Day/30 Days Discharge Instructions: Gently cleanse wound with antibacterial soap, rinse and pat dry prior to dressing wounds Cleanser:  Wound Cleanser Every Other Day/30 Days Discharge Instructions: Wash your hands with soap and water. Remove old dressing, discard into plastic bag and place into trash. Cleanse the wound with Wound Cleanser prior to applying a clean dressing using gauze sponges, not tissues or cotton balls. Do not scrub or use excessive force. Pat dry using gauze sponges, not tissue or cotton balls. Peri-Wound Care: AandD Ointment Every Other Day/30 Days Discharge Instructions: Apply AandD Ointment to areas of redness Topical: Santyl Collagenase Ointment, 30 (gm), tube Every Other Day/30 Days Discharge Instructions: apply nickel thick to wound bed only Prim Dressing: Gauze Every Other Day/30 Days ary Discharge Instructions: As directed: dry, moistened with saline or moistened with Dakins Solution Secondary Dressing: ABD Pad 5x9 (in/in) Every Other Day/30 Days Discharge Instructions: Cover with ABD pad Secured With: Kerlix Roll Sterile or Non-Sterile 6-ply 4.5x4 (yd/yd) Every Other Day/30 Days Discharge Instructions: Apply Kerlix as directed Electronic Signature(s) Signed: 11/06/2022 10:26:23 PM By: [name] PA-C Signed: 11/07/2022 12:55:54 PM By: Midge Aver MSN RN CNS WTA Entered By: Midge Aver on 11/06/2022 09:22:45 -------------------------------------------------------------------------------- Problem List Details Patient Name: Date of Service: Chad [name] 11/06/2022 8:30 A M Medical Record Number: [id number] Patient Account Number: [id number] Date of Birth/Sex: Treating RN: 1961-01-05 (62 y.o. [name] Primary Care  Provider: [name] Other Clinician: Referring Provider: Treating Provider/Extender: [name] Weeks in Treatment: 47 Active Problems ICD-10 Encounter Code Description Active Date MDM Diagnosis 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 (idiopathic) with ulcer and inflammation of left 12/06/2021 No Yes lower extremity L97.322 Non-pressure chronic ulcer of left ankle with fat layer exposed 12/06/2021 No Yes L98.492 Non-pressure chronic ulcer of skin of other sites with fat layer exposed 08/11/2022 No Yes Chad [name], Chad [name] ([id number]) [id number] Page 10 of [phone] Hemiplegia and hemiparesis following cerebral infarction affecting left non- 12/06/2021 No Yes dominant side 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, stage 3 10/07/2022 No Yes Inactive Problems Resolved Problems Electronic Signature(s) Signed: 11/06/2022 10:26:23 PM By: [name] PA-C Signed: 11/07/2022 12:55:54 PM By: Midge Aver MSN RN CNS WTA Previous Signature: 11/06/2022 8:18:31 AM Version By: [name] PA-C Entered By: Midge Aver on 11/06/2022 09:24:19 -------------------------------------------------------------------------------- Progress Note Details Patient Name: Date of Service: Chad [name] 11/06/2022 8:30 A M Medical Record Number: [id number] Patient Account Number: [id number] Date of Birth/Sex: Treating RN: [birth year] (62 y.o. [name] Primary Care Provider: [name] Other Clinician: Referring Provider: Treating Provider/Extender: [name] in Treatment: 47 Subjective Chief Complaint Information obtained from Patient Left ankle ulcer, Left dorsal foot wound, right heel History of Present Illness (HPI) 10/08/18 on evaluation today patient actually presents to our office for initial evaluation concerning wounds that he has of the bilateral lower extremities. 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 He also has bilateral venous stasis. He apparently has been homeless currently following discharge from the hospital apparently he has been placed at [facility] which is is a skilled nursing facility locally. Nonetheless fortunately he does not show any signs of infection at this time which is good news. In fact several of the wound actually appears to be showing some signs of improvement already in my pinion. There are a couple areas in the left leg in particular there likely gonna require some sharp debridement to help clear away some necrotic tissue and help  with more sufficient healing. No fevers, chills, nausea, or vomiting noted at this time. 10/15/18 on evaluation today patient actually appears to be doing very well in regard to his bilateral lower extremities. He's been tolerating the dressing changes without complication. Fortunately there does not appear to be any evidence of active infection at this time which is great news. Overall I'm actually very pleased with how this has progressed in just one visits time. Readmission: 08/14/2020 upon evaluation today patient presents for re-evaluation here in our clinic. He is having issues with his left ankle region as well as his right toe and his right heel. He tells me that the toe and heel actually began as a area that was itching that he was scratching and then subsequently opened up into wounds. These may have been abscess areas I presume based on what I am seeing currently. With regard to his left ankle region he tells me this was a similar type occurrence although he does have venous stasis this very well may be more of a venous leg ulcer more than anything. Nonetheless I do believe that the patient would benefit from appropriate and aggressive wound care to try to help get things under better control here. He  does have history of a stroke on the left side affecting him to some degree there that he is able to stand although he does have some residual weakness. Otherwise again the patient does have chronic venous insufficiency as previously noted. His arterial studies most recently obtained showed that he had an ABI on the right of 1.16 with a TBI of 0.52 and on the left and ABI of 1.14 with a TBI of 0.81. That was obtained on 06/19/2020. 08/28/2020 upon evaluation today patient appears to be doing decently well in regard to his wounds in general. He has been tolerating the dressing changes without complication. Fortunately there does not appear to be any signs of active infection which is great news. With that being said I think the [facility] is doing a good job I would recommend that we likely continue with that currently. 09/11/2020 upon evaluation today patient's wounds did not appear to be doing too poorly but again he is not really showing signs of significant improvement with regard to any of the wounds on the right. None of them have Hydrofera Blue on them I am not exactly sure why this is not being followed as the facility did not contact us to let us know of any issues with obtaining dressings or otherwise. With that being said he is supposed to be using Hydrofera Blue on both of the wounds on the right foot as well as the ankle wound on the left side. 09/18/2020 upon evaluation today patient appears to be doing poorly with regard to his wounds. Again right now the left ankle in particular showed signs of extreme maceration. Apparently he was told by someone with staff at [facility] they could not get the [facility]. With that being said this is something that is never been relayed to [place] one way or another. Also the patient subsequently has not supposed to have a border gauze dressing on. He should have an ABD pad and roll gauze to secure as this drains much too much just to have a  border gauze dressing to cover. Nonetheless the fact that they are not using the appropriate dressing is directly causing deterioration of the left ankle wound it is significantly worse today compared to what it was previous.  I did attempt to call [facility] while the patient was here I called three times and got no one to even pick up the phone. After this I had my for an office coordinator call and she was able to finally get through and leave a message with the D ON as of dictation of this note which is roughly about an hour and a half later I still have not been able to speak with anyone at the facility. 09/25/2020 upon evaluation today patient actually showing signs of good improvement which is excellent news. He has been tolerating the dressing changes without complication. Fortunately there is no signs of active infection which is great news. No fevers, chills, nausea, vomiting, or diarrhea. I do feel like the Chad [name], Chad [name] ([id number]) [id number] Page 11 of 16 facility has been doing a much better job at taking care of him as far as the dressings are concerned. However the director of nursing never did call me back. 10/09/2020 upon evaluation today patient appears to be doing well with regard to his wound. The toe ulcer did require some debridement but the other 2 areas actually appear to be doing quite well. 10/19/2020 upon evaluation today patient actually appears to be doing very well in regard to his wounds. In fact the heel does appear to be completely healed. The toe is doing better in the medial ankle on the left is also doing better. Overall I think he is headed in the right direction. 10/26/2020 upon evaluation today patient appears to be doing well with regard to his wound. He is showing signs of improvement which is great news and overall I am very pleased with where things stand today. No fevers, chills, nausea, vomiting, or diarrhea. 11/02/2020 upon  evaluation today patient appears to be doing well with regard to his wounds. He has been tolerating the dressing changes without complication overall I am extremely pleased with where things stand today. He in regard to the toe is almost completely healed and the medial ankle on the left is doing much better. 11/09/2020 upon evaluation today patient appears to be doing a little poorly in regard to his left medial ankle ulcer. Fortunately there does not appear to be any signs of systemic infection but unfortunately locally he does appear to be infected in fact he has blue-green drainage consistent with Pseudomonas. 11/16/2020 upon evaluation today patient appears to be doing well with regard to his wound. It actually appears to be doing better. I did place him on gentamicin cream since the Cipro was actually resistant even though he was positive for Pseudomonas on culture. Overall I think that he does seem to be doing better though I am unsure whether or not they have actually been putting the cream on. The patient is not sure that we did talk to the nurse directly and she was going to initiate that treatment. Fortunately there does not appear to be any signs of active infection at this time. No fevers, chills, nausea, vomiting, or diarrhea. 4/28; the area on the right second toe is close to healed. Left medial ankle required debridement 12/07/2020 upon evaluation today patient appears to be doing well with regard to his wounds. In fact the right second toe appears to be completely healed which is great news. Fortunately there does not appear to be any signs of active infection at this time which is also great news. I think we can probably discontinue the gentamicin on top of everything else. 12/14/2020 upon evaluation today  patient appears to be doing well with regard to his wound. He is making good progress and overall very pleased with where things stand today. There is no signs of active infection at  this time which is great news. 12/28/2020 upon evaluation today patient appears to be doing well with regard to his wounds. He has been tolerating the dressing changes without complication. Fortunately there is no signs of active infection at this time. No fevers, chills, nausea, vomiting, or diarrhea. 12/28/2020 upon evaluation today patient's wound bed actually showed signs of excellent improvement. He has great epithelization and granulation I do not see any signs of infection overall I am extremely pleased with where things stand at this point. No fevers, chills, nausea, vomiting, or diarrhea. 01/11/2021 upon evaluation today patient appears to be doing well with regard to his wound on his leg. He has been tolerating the dressing changes without complication. Fortunately there does not appear to be any signs of active infection which is great news. No fevers, chills, nausea, vomiting, or diarrhea. 01/25/2021 upon evaluation today patient appears to be doing well with regard to his wound. He has been tolerating the dressing changes without complication. Fortunately the collagen seems to be doing a great job which is excellent news. No fevers, chills, nausea, vomiting, or diarrhea. 02/08/2021 upon evaluation today patient's wound is actually looking a little bit worse especially in the periwound compared to previous. Fortunately there does not appear to be any signs of infection which is great news with that being said he does have some irritation around the periphery of the wound which has me more concerned. He actually had a dressing on that had not been changed in 3 days. He also is supposed to have daily dressing changes. With regard to the dressing applied he had a silver alginate dressing and silver collagen is what is recommended and ordered. He also had no Desitin around the edges of the wound in the periwound region although that is on the order inspect to be done as well. In general I was very  concerned I did contact [facility] actually spoke with Verlon Au who is the scheduling individual and subsequently she stated that she would pass the information to the D ON apparently the D ON was not available to talk to me when I call today. 02/18/2021 upon evaluation today patient's wound is actually showing signs of improvement. Fortunately there does not appear to be any evidence of infection which is great news overall I am extremely pleased with where things stand today. No fevers, chills, nausea, vomiting, or diarrhea. 8/3; patient presents for 1 week follow-up. He has no issues or complaints today. He denies signs of infection. 03/11/2021 upon evaluation today patient appears to be doing well with regard to his wound. He does have a little bit of slough noted on the surface of the wound but fortunately there does not appear to be any signs of active infection at this time. No fevers, chills, nausea, vomiting, or diarrhea. 03/18/2021 upon evaluation today patient appears to be doing well with regard to his wound. He has been tolerating the dressing changes without complication. There was a little irritation more proximal to where the wound was that was not noted last week but nonetheless this is very superficial just seems to be more irritation we just need to make sure to put a good amount of the zinc over the area in my opinion. Otherwise he does not seem to be doing significantly worse at  all which is great news. 03/25/2021 upon evaluation today patient appears to be doing well with regard to his wound. He is going require some sharp debridement today to clear with some of the necrotic debris. I did perform this today without complication postdebridement wound bed appears to be doing much better this is great news. 04/08/2021 upon evaluation today patient appears to be doing decently well in regard to his wound although the overall measurement is not significantly smaller compared to  previous. It is gone down a little bit but still the facility continues to not really put the appropriate dressings in place in fact he was supposed to have collagen we think he probably had more of an allergy to At this point. Fortunately there does not appear to be any signs of active infection systemically though locally I do not see anything on initial visualization either as far as erythema or warmth. 04/15/2021 upon evaluation today patient appears to be doing well with regard to his wound. He is actually showing signs of improvement. I did place him on antibiotics last week, Cipro. He has been taking that 2 times a day and seems to be tolerating it very well. I do not see any evidence of worsening and in fact the overall appearance of the wound is smaller today which is also great news. 9/26; left medial ankle chronic venous insufficiency wound is improved. Using Hydrofera Blue 10/10; left medial ankle chronic venous insufficiency. Wound has not changed much in appearance completely nonviable surface. Apparently there have been problems getting the right product on the wound at the facility although he came in with [facility] on today 05/14/2021 upon evaluation today patient appears to be doing well with regard to his wound. I think he is making progress here which is good news. Fortunately there does not appear to be any signs of active infection at this time. No fevers, chills, nausea, vomiting, or diarrhea. 05/20/2021 upon evaluation today patient appears to be doing well with regard to his wound. He is showing signs of good improvement which is great news. There does not appear to be any evidence of active infection which is also excellent news. No fevers, chills, nausea, vomiting, or diarrhea. 05/28/2021 upon evaluation today patient appears to be doing quite well. There does not appear to be any signs of active infection at this time which is great news. Overall I am extremely pleased  with where things stand today. I think he is headed in the right direction. 06/11/2021 upon evaluation today patient appears to be doing well with regard to his left ankle ulcer and poorly in regard to the toe ulcer on the second toe right foot. This appears to show signs of joint exposure. Apparently this has been present for 1 to 2 months although he kept forgetting to tell me about it. That is JARRARD, HINDLEY ([id number]) [id number] Page 12 of 16 unfortunate as right now it definitely appears to be doing significantly worse than what I would like to see. There does not appear to be any signs of active infection systemically though locally I am concerned about the possibility of infection the toe is quite red. Again no one from the facility ever contacted [place] to advise that this was going on in the interim either. 06/17/2021 upon evaluation today patient presents for follow-up I did review his x-ray which showed a navicular bone fracture I am unsure of the chronicity of this. Subsequently he also had osteomyelitis of the toe which was  what I was more concerned about this did not show up on x-ray but did show up on the pathology scrapings. This was listed as acute osteomyelitis. Nonetheless at this point I think that the antibiotic treatment is the best regimen to go with currently. The patient is in agreement with that plan. Nonetheless he has initially 30 days of doxycycline off likely extend that towards the end of the treatment cycle that will be around the middle of December for an additional 2 weeks. That all depends on how well he continues to heal. Nonetheless based on what I am seeing in the foot I did want a proceed with an MRI as well which I think will be helpful to identify if there is anything else that needs to be addressed from the standpoint of infection. 06/24/2021 upon evaluation today patient appears to be doing pretty well in regards to his wounds. I  think both are actually showing signs of improvement which is good I did review his MRI today which did show signs of osteomyelitis of the middle and proximal phalanx on his right foot of the affected toe. With that being said this is actually showing signs of significant improvement today already with the antibiotic therapy I think the redness is also improved. Overall I think that we just need to give this some time with appropriate wound care we will see how things go potentially hyperbarics could be considered. 07/02/2021 upon inspection today patient actually appears to be doing well in regard to his left ankle which is getting very close to complete resolution of pleased in that regard. Unfortunately he is continuing to have issues with his second toe right foot and this seems to still be very painful for him. Recommend he try something different from the standpoint of antibiotics. 07/15/2021 upon evaluation today patient appears to be doing actually pretty well in regard to his foot. This is actually showing signs of significant improvement which is great news. Overall I feel like the patient is improving both in regard to the second toe as well as the ankle on the left. With that being said the biggest issue that I do see currently is that he is needing to have a refill of the doxycycline that we previously treated him with. He also did see podiatry they are not going to recommend any amputation at this point since he seems to be doing quite well. For that reason we just need to keep things under control from an infection standpoint. 08/01/2021 upon evaluation today patient appears to be doing well with regard to his wound. He has been tolerating the dressing changes without complication. Fortunately there does not appear to be any evidence of active infection locally nor systemically at this point. In fact I think everything is doing excellent in fact his second toe on the right foot is almost  healed and the ankle on the left ankle region is actually very close to being healed as well. 08/08/2021 upon evaluation today patient appears to be doing well with regard to his wound. He has been tolerating the dressing changes without complication. Fortunately I do not see any signs of active infection at this time. Readmission: 12-06-2021 upon evaluation today patient presents for reevaluation here in the clinic he does tell me that he was being seen in facility at [facility] by a provider that was coming in. He is not sure who this was. He tells me however that the wound seems to have gotten worse even  compared to where it was when we last saw him at this point. With that being said I do believe that he is likely going need ongoing wound care here in the clinic and I do believe that we need to be the ones to frontline this since his wound does seem to be getting worse not better at this point. He voiced understanding. He is also in agreement with this plan and feels more comfortable coming here she tells me. Patient's medical history really has not changed since his prior admission he was only gone since January. 12-27-2021 upon evaluation today patient appears to be doing well with regard to his wound they did run out of the [facility] so they did not put anything on just an ABD pad with gentamicin. Still we are seeing some signs of good improvement here with some new epithelization which is great news. 01-10-2022 upon evaluation today patient appears to be doing well with regard to his wounds and he is going require some sharp debridement but overall seems to be making good progress. Fortunately I do not see any evidence of active infection locally or systemically at this time which is great news. 01-24-2022 upon evaluation today patient appears to be doing well with regard to his wound. The facility actually came and dropped him off early and he had another appointment at the hospital  and then they just brought him over here and this was still hours before his appointment this afternoon. For that reason we did do our best to work him in this morning and fortunately had some space to make this happen. With that being said patient's wound does seem to be making progress here and I am very pleased in that regard I do not see any signs of active infection locally or systemically at this time. 02-07-2022 patient appears to be doing well currently in regard to his wounds. In fact one of them the more proximal is healed the distal is still open but seems to be doing excellent. Fortunately I do not see any evidence of active infection locally or systemically at this time which is great news. No fevers, chills, nausea, vomiting, or diarrhea. 7/27; left medial ankle venous. Improving per our intake nurse. We are using Hydrofera Blue under Tubigrip compression. They are changing that at his facility 03-06-2022 upon evaluation today patient appears to be doing well with regard to the his wound he is can require some sharp debridement but seems to be making excellent progress. Fortunately I do not see any evidence of active infection locally or systemically which is great news. 03-27-2022 upon evaluation today patient's wound is actually showing signs of excellent improvement. Fortunately I see no signs of active infection locally or systemically at this time which is great news. No fevers, chills, nausea, vomiting, or diarrhea. 04-21-2022 upon evaluation today patient appears to be doing excellent in regard to his wound in fact this is very close to resolution based on what I am seeing. I do not see any evidence of active infection locally or systemically at this time which is great news and overall I am extremely pleased with where we are today. 05-05-2022 upon evaluation today patient's wound actually appears to potentially be completely healed. Fortunately I do not see any evidence of  active infection at this time which is great news and overall I am very pleased I think this needs a little time to toughen up but other than that I really do believe were doing  quite well. He is very pleased to hear this its been a long time coming. 05-19-2022 upon evaluation today patient appears to be doing well currently in regard to his wound. He in fact we were hoping will be completely healed and closed but it still has a very tiny area right in the center which is still continuing to drain. Fortunately I do not see any signs of infection locally or systemically which is great news. 11/6; this wound is close to completely" he comes in this week with some erythema at 1 edge of this which looks like something was rubbing on the area. Other than that no evidence of infection 06-16-2022 upon evaluation today patient appears to be doing well currently in regard to his ankle ulcer. This is very close to being healed but still has a small area in the central portion which is not. Fortunately there does not appear to be any signs of infection locally or systemically which is great news. No fevers, chills, nausea, vomiting, or diarrhea. 06-30-2022 upon evaluation patient's wound pretty much appears to be almost completely closed. In fact it may even be closed but there are still a lot of skin irritation around the edges of the wound and I just do not feel great about releasing them yet I would like to continue to monitor this just a little bit longer before getting to that point. He is not opposed to this and in fact is happy to continue to come in if need be in order to make sure that things are moving in the right direction he definitely does not want to backtrack. 07-14-2022 upon evaluation today patient appears to be doing well currently in regard to his wound. Has been tolerating the dressing changes without Chad [name], Chad [name] ([id number]) [id number] Page 13 of  16 complication. Fortunately I see no evidence of active infection locally nor systemically which is great news. No fevers, chills, nausea, vomiting, or diarrhea. 08-11-2022 upon evaluation today patient appears to be doing a little worse than last time I saw him although it has been a month. Unfortunately he was not able to be seen due to the fact that he was quarantined in the facility with COVID. Subsequently he tells me that he mention to the facility staff several times about taking it off over the past several weeks but they continue to tell him that someone have to get in touch with [place] here although nobody ever did until Friday when the nurse manager called to have that documented in the communication notes. With that being said unfortunately this means that he had a wrap on that he was supposed to have on for only 1 week for ended up being on four 1 month essentially. I am glad that there is nothing worse than what we see currently to be perfectly honest. 08-19-2022 upon evaluation today patient appears to be doing well currently in regard to his wounds. He has been tolerating the dressing changes without complication. This is actually smaller today which is great news after last week's reopening. Fortunately I do not see any evidence of infection locally nor systemically at this point. 08-26-2022 upon evaluation today patient appears to be doing well currently in regard to his wound. He has been tolerating the dressing changes without complication. Fortunately there does not appear to be any signs of active infection locally nor systemically which is great news. No fevers, chills, nausea, vomiting, or diarrhea. 09-02-2022 upon evaluation today patient's wound actually showing signs of excellent  improvement this is measuring smaller and looking much better. I am very pleased with where we stand I do believe that we are headed in the right direction. 09-09-2022 upon evaluation today patient appears  to be doing decently well in regard to his leg in general although there was some swelling this is the 1 thing that I am not too happy with based on what I see currently. Fortunately there does not appear to be any signs of infection locally nor systemically at this point. 09-16-2022 upon evaluation today patient appears to be doing well currently in regard to his wound. He is actually showing signs of improvement he wore the Tubigrip this week and it significantly better compared to last week's evaluation. Fortunately I do not see any evidence of active infection locally or systemically which is great news. Overall I think that were headed in the right direction which is great news. In fact overall I think this is the best that he has looked in some time. He does not like the Tubigrip but actually did extremely well with this in my opinion. I think he needs to continue to utilize this. 09-23-2022 upon evaluation today patient appears to be doing well currently in regard to his wound. He has been tolerating the dressing changes without complication. Fortunately there does not appear to be any signs of active infection locally nor systemically which is great news and in general I do feel like that we are headed in the right direction. He has been having trouble with the Tubigrip however we have gotten the facility to order compression socks he has not gotten them as of yet. 3/4; patient is about the same with a wound on the left posterior leg. This is a venous wound. He needs more compression on this leg but he took off the previous wraps we are using. I am not certain whether he is using juxta lite stockings at the nursing home. 3/12; patient presents for follow-up. He unfortunately has 2 new wounds 1 to the dorsal left side and the other to the right heel. He is not sure how these happened. The right heel appears to be caused by pressure. He has been using Hydrofera Blue to the original wound on the  left leg. 10-14-2022 upon evaluation today patient appears to be doing well currently in regard to his original wound but unfortunately he has several new wounds which I was completely unaware of before walking into the room to see him today. Unfortunately he seems to not be doing nearly as well as what I saw when I last had an appointment with him 3 weeks ago. Fortunately I do not see any signs of systemic infection though unfortunately locally he has definite infection of the left lower extremity. He is currently on doxycycline I am going to likely have to extend that today. 10-21-2022 upon evaluation today patient appears to be doing well currently in regard to his wounds. He seems to be tolerating the dressing changes without complication. Fortunately there does not appear to be any signs of active infection locally or systemically which is great news and in general I definitely feel like we are on the right track here. 10-30-2022 upon evaluation today patient still continues to not be doing too well at all with regard to his wounds. Unfortunately he is still having signs of infection as well which also has been concerned. Fortunately I do not see any signs of active infection locally nor systemically at this time. No  fevers, chills, nausea, vomiting, or diarrhea. 11-06-2022 upon evaluation today patient appears to be doing okay currently in regard to his wounds. All things considered with his blood flow not being nearly as good as what should I think that he is making pretty good progress here. Fortunately I do not see any signs of infection locally nor systemically which is great news. No fevers, chills, nausea, vomiting, or diarrhea. Objective Constitutional Well-nourished and well-hydrated in no acute distress. Vitals Time Taken: 8:04 AM, Height: 69 in, Weight: 150 lbs, BMI: 22.1, Temperature: 97.9 F, Pulse: 89 bpm, Respiratory Rate: 16 breaths/min, Blood Pressure: 126/79  mmHg. Respiratory normal breathing without difficulty. Psychiatric this patient is able to make decisions and demonstrates good insight into disease process. Alert and Oriented x 3. pleasant and cooperative. General Notes: Upon inspection patient's wound bed actually showed signs of good granulation epithelization at this point. Fortunately I do not see any evidence of active infection locally nor systemically which is great news and overall I am extremely pleased with with where we stand currently. I think that he is doing well all things considered I did place him on a different medication, cefdinir, yesterday and that should be started at facility soon. Integumentary (Hair, Skin) Wound #12 status is Open. Original cause of wound was Gradually Appeared. The date acquired was: 07/12/2019. The wound has been in treatment 47 weeks. The wound is located on the Left,Distal,Medial Ankle. The wound measures 1.3cm length x 0.6cm width x 0.1cm depth; 0.613cm^2 area and 0.061cm^3 volume. There is Fat Layer (Subcutaneous Tissue) exposed. There is a medium amount of serosanguineous drainage noted. The wound margin is distinct with the outline LASARO, PRIMM ([id number]) [id number] Page 14 of 16 attached to the wound base. There is large (67-100%) pink granulation within the wound bed. There is a small (1-33%) amount of necrotic tissue within the wound bed including Adherent Slough. Wound #15 status is Open. Original cause of wound was Gradually Appeared. The date acquired was: 10/01/2022. The wound has been in treatment 4 weeks. The wound is located on the Left,Dorsal Foot. The wound measures 3.7cm length x 0.7cm width x 0.2cm depth; 2.034cm^2 area and 0.407cm^3 volume. There is a medium amount of serosanguineous drainage noted. There is no granulation within the wound bed. There is a large (67-100%) amount of necrotic tissue within the wound bed including Adherent Slough. Wound  #16 status is Open. Original cause of wound was Gradually Appeared. The date acquired was: 09/26/2022. The wound has been in treatment 4 weeks. The wound is located on the Right Calcaneus. The wound measures 1.3cm length x 0.8cm width x 0.1cm depth; 0.817cm^2 area and 0.082cm^3 volume. There is Fat Layer (Subcutaneous Tissue) exposed. There is a medium amount of serosanguineous drainage noted. There is medium (34-66%) red granulation within the wound bed. There is a medium (34-66%) amount of necrotic tissue within the wound bed including Adherent Slough. Wound #17 status is Open. Original cause of wound was Pressure Injury. The date acquired was: 09/30/2022. The wound has been in treatment 3 weeks. The wound is located on the Left,Anterior Lower Leg. The wound measures 3.2cm length x 2cm width x 0.3cm depth; 5.027cm^2 area and 1.508cm^3 volume. There is a medium amount of serosanguineous drainage noted. There is small (1-33%) red granulation within the wound bed. There is a medium (34-66%) amount of necrotic tissue within the wound bed including Eschar. Wound #18 status is Open. Original cause of wound was Pressure Injury. The date acquired was: 09/26/2022. The  wound has been in treatment 3 weeks. The wound is located on the Right,Medial T Second. The wound measures 1cm length x 0.8cm width x 0.1cm depth; 0.628cm^2 area and 0.063cm^3 volume. oe There is a medium amount of serosanguineous drainage noted. There is small (1-33%) red, pink granulation within the wound bed. There is a medium (34-66%) amount of necrotic tissue within the wound bed including Adherent Slough. 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 There was a Chemical/Enzymatic/Mechanical debridement performed by [name], PA-C. With the following instrument(s): santyl after achieving pain control using Lidocaine 4% Topical Solution. Agent used was [employer]. A time out was conducted at 09:00, prior to the start of the procedure. There was no bleeding. The procedure was tolerated well. Post Debridement Measurements: 3.7cm length x 0.7cm width x 0.2cm depth; 0.407cm^3 volume. Character of Wound/Ulcer Post Debridement is stable. Severity of Tissue Post Debridement is: Fat layer exposed. Post procedure Diagnosis Wound #15: Same as Pre-Procedure Wound #16 Pre-procedure diagnosis of Wound #16 is a Pressure Ulcer located on the Right Calcaneus . There was a Chemical/Enzymatic/Mechanical debridement performed by [name], PA-C. With the following instrument(s): santyl after achieving pain control using Lidocaine 4% T opical Solution. Agent used was [employer]. A time out was conducted at 09:00, prior to the start of the procedure. There was no bleeding. The procedure was tolerated well. Post Debridement Measurements: 1.3cm length x 0.8cm width x 0.1cm depth; 0.082cm^3 volume. Post debridement Stage noted as Category/Stage III. Character of Wound/Ulcer Post Debridement is stable. Post procedure Diagnosis Wound #16: Same as Pre-Procedure Wound #17 Pre-procedure diagnosis of Wound #17 is a Pressure Ulcer located on the Left,Anterior Lower Leg . There was a Chemical/Enzymatic/Mechanical debridement performed by [name], PA-C. With the following instrument(s): santyl after achieving pain control using Lidocaine 4% T opical Solution. Agent used was [employer]. A time out was conducted at 09:00, prior to the start of the procedure.  There was no bleeding. The procedure was tolerated well. Post Debridement Measurements: 3.2cm length x 2cm width x 0.3cm depth; 1.508cm^3 volume. Post debridement Stage noted as Category/Stage II. Character of Wound/Ulcer Post Debridement is stable. Post procedure Diagnosis Wound #17: Same as Pre-Procedure Wound #18 Pre-procedure diagnosis of Wound #18 is a Pressure Ulcer located on the Right,Medial T Second . There was a Chemical/Enzymatic/Mechanical debridement oe performed by [name], PA-C. With the following instrument(s): santyl after achieving pain control using Lidocaine 4% T opical Solution. Agent used was [employer]. A time out was conducted at 09:00, prior to the start of the procedure. There was no bleeding. The procedure was tolerated well. Post Debridement Measurements: 1cm length x 0.8cm width x 0.1cm depth; 0.063cm^3 volume. Post debridement Stage noted as Category/Stage II. Character of Wound/Ulcer Post Debridement is stable. Post procedure Diagnosis Wound #18: Same as Pre-Procedure Plan Follow-up Appointments: Return Appointment in 1 week. Chad [name], Chad [name] ([id number]) [id number] Page 15 of 16 Bathing/ Shower/ Hygiene: May shower with wound dressing protected with water repellent cover or cast protector. No tub bath. Anesthetic (Use 'Patient Medications' Section for Anesthetic Order Entry):  Lidocaine applied to wound bed Edema Control - Lymphedema / Segmental Compressive Device / Other: Patient to wear own compression stockings. Remove compression stockings every night before going to bed and put on every morning when getting up. - please order patient compression stockings 8-5mm/hg ankle 23cm, calf 32cm, leg length floor to knee 40cm Elevate legs to the level of the heart and pump ankles as often as possible Elevate leg(s) parallel to the floor when sitting. Off-Loading: Heel suspension boot - when in bed Medications-Please add to medication  list.: Other: - apply AandD ointment to leg, do not apply to wound WOUND #12: - Ankle Wound Laterality: Left, Medial, Distal Cleanser: Soap and Water Every Other Day/30 Days Discharge Instructions: Gently cleanse wound with antibacterial soap, rinse and pat dry prior to dressing wounds Cleanser: Wound Cleanser Every Other Day/30 Days Discharge Instructions: Wash your hands with soap and water. Remove old dressing, discard into plastic bag and place into trash. Cleanse the wound with Wound Cleanser prior to applying a clean dressing using gauze sponges, not tissues or cotton balls. Do not scrub or use excessive force. Pat dry using gauze sponges, not tissue or cotton balls. Peri-Wound Care: AandD Ointment Every Other Day/30 Days Discharge Instructions: Apply AandD Ointment to areas of redness Prim Dressing: Hydrofera Blue Ready Transfer Foam, 4x5 (in/in) Every Other Day/30 Days ary Discharge Instructions: Apply Hydrofera Blue Ready to wound bed as directed Secondary Dressing: ABD Pad 5x9 (in/in) Every Other Day/30 Days Discharge Instructions: Cover with ABD pad Secured With: Kerlix Roll Sterile or Non-Sterile 6-ply 4.5x4 (yd/yd) Every Other Day/30 Days Discharge Instructions: Apply Kerlix as directed WOUND #15: - Foot Wound Laterality: Dorsal, Left Cleanser: Soap and Water Every Other Day/30 Days Discharge Instructions: Gently cleanse wound with antibacterial soap, rinse and pat dry prior to dressing wounds Cleanser: Wound Cleanser Every Other Day/30 Days Discharge Instructions: Wash your hands with soap and water. Remove old dressing, discard into plastic bag and place into trash. Cleanse the wound with Wound Cleanser prior to applying a clean dressing using gauze sponges, not tissues or cotton balls. Do not scrub or use excessive force. Pat dry using gauze sponges, not tissue or cotton balls. Peri-Wound Care: AandD Ointment Every Other Day/30 Days Discharge Instructions: Apply AandD  Ointment to areas of redness Topical: Santyl Collagenase Ointment, 30 (gm), tube Every Other Day/30 Days Discharge Instructions: apply nickel thick to wound bed only Prim Dressing: Gauze Every Other Day/30 Days ary Discharge Instructions: As directed: dry, moistened with saline or moistened with Dakins Solution Secondary Dressing: ABD Pad 5x9 (in/in) Every Other Day/30 Days Discharge Instructions: Cover with ABD pad Secured With: Kerlix Roll Sterile or Non-Sterile 6-ply 4.5x4 (yd/yd) Every Other Day/30 Days Discharge Instructions: Apply Kerlix as directed WOUND #16: - Calcaneus Wound Laterality: Right Cleanser: Soap and Water Every Other Day/30 Days Discharge Instructions: Gently cleanse wound with antibacterial soap, rinse and pat dry prior to dressing wounds Cleanser: Wound Cleanser Every Other Day/30 Days Discharge Instructions: Wash your hands with soap and water. Remove old dressing, discard into plastic bag and place into trash. Cleanse the wound with Wound Cleanser prior to applying a clean dressing using gauze sponges, not tissues or cotton balls. Do not scrub or use excessive force. Pat dry using gauze sponges, not tissue or cotton balls. Peri-Wound Care: AandD Ointment Every Other Day/30 Days Discharge Instructions: Apply AandD Ointment to areas of redness Topical: Santyl Collagenase Ointment, 30 (gm), tube Every Other Day/30 Days Discharge Instructions: apply nickel thick to wound  bed only Prim Dressing: Gauze Every Other Day/30 Days ary Discharge Instructions: As directed: dry, moistened with saline or moistened with Dakins Solution Secondary Dressing: ABD Pad 5x9 (in/in) Every Other Day/30 Days Discharge Instructions: Cover with ABD pad Secured With: Kerlix Roll Sterile or Non-Sterile 6-ply 4.5x4 (yd/yd) Every Other Day/30 Days Discharge Instructions: Apply Kerlix as directed WOUND #17: - Lower Leg Wound Laterality: Left, Anterior Cleanser: Soap and Water Every Other Day/30  Days Discharge Instructions: Gently cleanse wound with antibacterial soap, rinse and pat dry prior to dressing wounds Cleanser: Wound Cleanser Every Other Day/30 Days Discharge Instructions: Wash your hands with soap and water. Remove old dressing, discard into plastic bag and place into trash. Cleanse the wound with Wound Cleanser prior to applying a clean dressing using gauze sponges, not tissues or cotton balls. Do not scrub or use excessive force. Pat dry using gauze sponges, not tissue or cotton balls. Peri-Wound Care: AandD Ointment Every Other Day/30 Days Discharge Instructions: Apply AandD Ointment to areas of redness Topical: Santyl Collagenase Ointment, 30 (gm), tube Every Other Day/30 Days Discharge Instructions: apply nickel thick to wound bed only Prim Dressing: Algicell Calcium Alginate Dressing, 4X5 (in/in) Every Other Day/30 Days ary Secondary Dressing: ABD Pad 5x9 (in/in) Every Other Day/30 Days Discharge Instructions: Cover with ABD pad Secured With: Kerlix Roll Sterile or Non-Sterile 6-ply 4.5x4 (yd/yd) Every Other Day/30 Days Discharge Instructions: Apply Kerlix as directed WOUND #18: - T Second Wound Laterality: Right, Medial oe Cleanser: Soap and Water Every Other Day/30 Days Discharge Instructions: Gently cleanse wound with antibacterial soap, rinse and pat dry prior to dressing wounds Cleanser: Wound Cleanser Every Other Day/30 Days Discharge Instructions: Wash your hands with soap and water. Remove old dressing, discard into plastic bag and place into trash. Cleanse the wound with Wound Cleanser prior to applying a clean dressing using gauze sponges, not tissues or cotton balls. Do not scrub or use excessive force. Pat dry using gauze sponges, not tissue or cotton balls. Peri-Wound Care: AandD Ointment Every Other Day/30 Days Discharge Instructions: Apply AandD Ointment to areas of redness Topical: Santyl Collagenase Ointment, 30 (gm), tube Every Other Day/30  Days Discharge Instructions: apply nickel thick to wound bed only Prim Dressing: Gauze Every Other Day/30 Days ary Discharge Instructions: As directed: dry, moistened with saline or moistened with Dakins Solution CORIN, TILLY ([id number]) [id number] Page 16 of 16 Secondary Dressing: ABD Pad 5x9 (in/in) Every Other Day/30 Days Discharge Instructions: Cover with ABD pad Secured With: Kerlix Roll Sterile or Non-Sterile 6-ply 4.5x4 (yd/yd) Every Other Day/30 Days Discharge Instructions: Apply Kerlix as directed 1. Would recommend currently that we have the patient continue to monitor for any evidence of infection or worsening. Right now I think the regarding ongoing antibiotic and from a vascular standpoint. Be doing an angiogram left leg first and right leg to follow. This should hopefully heal. 2. Also can recommend that we have the patient continue to monitor for any signs of infection or worsening. Office if anything changes he can let me know. We will see patient back for reevaluation in 1 week here in the clinic. If anything worsens or changes patient will contact our office for additional recommendations. Electronic Signature(s) Signed: 11/06/2022 9:34:38 AM By: [name] PA-C Entered By: [name] on 11/06/2022 09:34:37 -------------------------------------------------------------------------------- SuperBill Details Patient Name: Date of Service: CLAVIN, RUHLMAN 11/06/2022 Medical Record Number: [id number] Patient Account Number: [id number] Date of Birth/Sex: Treating RN: Jun 11, 1961 (62 y.o. [name] Primary Care Provider: [name]  Other Clinician: Referring Provider: Treating Provider/Extender: [name] in Treatment: 47 Diagnosis Coding ICD-10 Codes Code Description 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 (idiopathic) with ulcer and inflammation of left lower extremity L97.322 Non-pressure chronic ulcer of left  ankle with fat layer exposed L98.492 Non-pressure chronic ulcer of skin of other sites with fat layer exposed 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 side 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, stage 3 Facility Procedures : CPT4 Code: 56433295 Description: 18841 - WOUND CARE VISIT-LEV 5 EST PT Modifier: Quantity: 1 Physician Procedures : CPT4 Code Description Modifier [id number] - WC PHYS LEVEL 3 - EST PT ICD-10 Diagnosis Description 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 (idiopathic) with ulcer and inflammation of left lower extremity L97.322 Non-pressure chronic ulcer of left ankle  with fat layer exposed L98.492 Non-pressure chronic ulcer of skin of other sites with fat layer exposed 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 side Quantity: 1 Electronic Signature(s) Signed: 11/06/2022 9:35:44 AM By: [name] PA-C Entered By: [name] on 11/06/2022 09:35:43

## 2022-11-06 NOTE — Progress Notes (Signed)
Subjective:    Patient ID: Chad [name], male    DOB: 10/22/1960, 62 y.o.   MRN: [id number] Chief Complaint  Patient presents with   Follow-up    . LEA/consult. decreased ABIs. R heel/L ankle ulcers.    The patient returns to the office for followup and review of the noninvasive studies.  He was last seen in 2021 and at that time he had essentially normal ABIs bilaterally with some evidence of tibial disease.  The patient has had a left lower extremity ulceration which has been there for an extensive amount of time.  Has healed but recently the healing has been slower.  He has an ulcer on his right heel as well as other multiple small wounds on his left foot.  The patient notes that there has been a significant deterioration in the lower extremity symptoms.  The patient notes interval shortening of their claudication distance and development of mild rest pain symptoms. No new ulcers or wounds have occurred since the last visit.  There have been no significant changes to the patient's overall health care.  The patient denies amaurosis fugax or recent TIA symptoms. There are no recent neurological changes noted. There is no history of DVT, PE or superficial thrombophlebitis. The patient denies recent episodes of angina or shortness of breath.   ABI's Rt=0.81 and Lt=0.59 (previous ABI's Rt=1.16 and Lt=1.16) Duplex US of the lower extremity arterial system shows primarily biphasic/triphasic waveforms throughout the popliteal SFA areas bilaterally but there is extensive tibial level disease bilaterally.    Review of Systems  Skin:  Positive for wound.  Neurological:  Positive for weakness.  All other systems reviewed and are negative.      Objective:   Physical Exam Vitals reviewed.  Constitutional:      Appearance: He is ill-appearing.  HENT:     Head: Normocephalic.  Cardiovascular:     Rate and Rhythm: Normal rate.     Pulses:          Dorsalis pedis pulses are detected w/  Doppler on the right side and detected w/ Doppler on the left side.       Posterior tibial pulses are detected w/ Doppler on the right side and detected w/ Doppler on the left side.  Pulmonary:     Effort: Pulmonary effort is normal.  Skin:    General: Skin is warm and dry.  Neurological:     Mental Status: He is oriented to person, place, and time.     Motor: Weakness present.  Psychiatric:        Mood and Affect: Mood normal.        Behavior: Behavior normal.        Thought Content: Thought content normal.        Judgment: Judgment normal.     BP 117/75 (BP Location: Right Arm)   Pulse 88   Resp 18   Ht 5\' 7"  (1.702 m)   Wt 160 lb (72.6 kg)   BMI 25.06 kg/m   Past Medical History:  Diagnosis Date   Anemia    Anxiety    ARF (acute respiratory failure)    Bladder cancer    COPD (chronic obstructive pulmonary disease)    Depression    Dysphagia    Family history of colon cancer    Family history of kidney cancer    Family history of leukemia    Family history of prostate cancer    GERD (gastroesophageal reflux disease)  Hepatitis    chronic hep c   Hydronephrosis    Hydronephrosis with ureteral stricture    Hyperlipidemia    Knee pain    Left   Malignant neoplasm of colon    Nerve pain    Peripheral vascular disease    Prostate cancer    Stroke    Ureteral cancer, right    Urinary frequency    Venous hypertension of both lower extremities     Social History   Socioeconomic History   Marital status: Single    Spouse name: Not on file   Number of children: Not on file   Years of education: Not on file   Highest education level: Not on file  Occupational History   Not on file  Tobacco Use   Smoking status: Every Day    Packs/day: 1    Types: Cigarettes   Smokeless tobacco: Never  Vaping Use   Vaping Use: Never used  Substance and Sexual Activity   Alcohol use: Not Currently   Drug use: Not Currently   Sexual activity: Not Currently  Other  Topics Concern   Not on file  Social History Narrative    used to live [place]; moved  To [place]- end of April 2019; in Nursing home; 1pp/day; quit alcohol. 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        Family- dad- prostate ca [at 78y]; brother- 28 died of prostate cancer; brother- 32- no cancers [[place]]; sonThayer Ohm [[place]];[name]-32y prostate ca [facility] [place]]; daughter- 63 [[place]]; another daughter 40 [[place]/addict]. will refer genetics counseling. Given MSI- abnormal; highly suspicious of Lynch syndrome.  Patient's son Cristal Deer aware of high possible lynch syndrome.   Social Determinants of Health   Financial Resource Strain: Not on file  Food Insecurity: Not on file  Transportation Needs: Not on file  Physical Activity: Not on file  Stress: Not on file  Social Connections: Not on file  Intimate Partner Violence: Not on file    Past Surgical History:  Procedure Laterality Date   COLON SURGERY     En bloc extended right hemicolectomy 07/2017   COLONOSCOPY WITH PROPOFOL N/A 11/06/2020   Procedure: COLONOSCOPY WITH PROPOFOL;  Surgeon: Wyline Mood, MD;  Location: [facility] ENDOSCOPY;  Service: Gastroenterology;  Laterality: N/A;   COLONOSCOPY WITH PROPOFOL N/A 07/31/2021   Procedure: COLONOSCOPY WITH PROPOFOL;  Surgeon: Earline Mayotte, MD;  Location: [facility] ENDOSCOPY;  Service: Endoscopy;  Laterality: N/A;   CYSTOSCOPY W/ RETROGRADES Right 08/30/2018   Procedure: CYSTOSCOPY WITH RETROGRADE PYELOGRAM;  Surgeon: Vanna Scotland, MD;  Location: [facility] ORS;  Service: Urology;  Laterality: Right;   CYSTOSCOPY WITH STENT PLACEMENT Right 04/25/2018   Procedure: CYSTOSCOPY WITH STENT PLACEMENT;  Surgeon: Vanna Scotland, MD;  Location: [facility] ORS;  Service: Urology;  Laterality: Right;   CYSTOSCOPY WITH STENT PLACEMENT Right 08/30/2018   Procedure: CYSTOSCOPY WITH STENT Exchange;  Surgeon: Vanna Scotland, MD;  Location: [facility] OR[facility];  Service: Urology;  Laterality: Right;   CYSTOSCOPY WITH STENT PLACEMENT Right 03/07/2019    Procedure: CYSTOSCOPY WITH STENT Exchange;  Surgeon: Vanna Scotland, MD;  Location: [facility] OR[facility];  Service: Urology;  Laterality: Right;   CYSTOSCOPY WITH STENT PLACEMENT Right 11/21/2019   Procedure: CYSTOSCOPY WITH STENT Exchange;  Surgeon: Vanna Scotland, MD;  Location: [facility] ORS;  Service: Urology;  Laterality: Right;   LOWER EXTREMITY ANGIOGRAPHY Left 05/23/2019   Procedure: LOWER EXTREMITY ANGIOGRAPHY;  Surgeon: Annice Needy, MD;  Location: [facility];  Service: Cardiovascular;  Laterality: Left;   LOWER EXTREMITY ANGIOGRAPHY Right 05/30/2019   Procedure: LOWER EXTREMITY ANGIOGRAPHY;  Surgeon: Annice Needy, MD;  Location: [facility];  Service: Cardiovascular;  Laterality: Right;   LOWER EXTREMITY ANGIOGRAPHY Right 02/13/2020   Procedure: LOWER EXTREMITY ANGIOGRAPHY;  Surgeon: Annice Needy, MD;  Location: [facility];  Service: Cardiovascular;  Laterality: Right;   LOWER EXTREMITY ANGIOGRAPHY Left 02/20/2020   Procedure: LOWER EXTREMITY ANGIOGRAPHY;  Surgeon: Annice Needy, MD;  Location: [facility];  Service: Cardiovascular;  Laterality: Left;   PORTA CATH INSERTION N/A 02/28/2019   Procedure: PORTA CATH INSERTION;  Surgeon: Annice Needy, MD;  Location: [facility];  Service: Cardiovascular;  Laterality: N/A;   tumor removed       Family History  Problem Relation Age of Onset   Prostate cancer Father 15   Cancer Brother 27       unsure type   Cancer Paternal Uncle        unsure type   Cancer Maternal Grandmother        unsure type   Cancer Paternal Grandmother        unsure type   Kidney cancer Paternal Grandfather    Cancer Other        unsure types   Leukemia Son    Cancer Son        other cancers, possibly colon    Allergies  Allergen Reactions   Penicillins Rash       Latest Ref Rng & Units 11/04/2022   12:21 PM 10/28/2022    7:54 AM 10/09/2022    8:37 AM  CBC  WBC 4.0 - 10.5 K/uL 5.1  5.7  9.1   Hemoglobin 13.0 - 17.0 g/dL  68.0  32.1  22.4   Hematocrit 39.0 - 52.0 % 43.4  41.0  40.0   Platelets 150 - 400 K/uL 146  137  179       CMP     Component Value Date/Time   NA 135 11/04/2022 1221   K 4.1 11/04/2022 1221   CL 106 11/04/2022 1221   CO2 23 11/04/2022 1221   GLUCOSE 92 11/04/2022 1221   BUN 27 (H) 11/04/2022 1221   CREATININE 1.10 11/04/2022 1221   CALCIUM 8.5 (L) 11/04/2022 1221   PROT 7.7 10/28/2022 0754   ALBUMIN 3.2 (L) 10/28/2022 0754   AST 35 10/28/2022 0754   ALT 21 10/28/2022 0754   ALKPHOS 110 10/28/2022 0754   BILITOT 0.4 10/28/2022 0754   GFRNONAA >60 11/04/2022 1221   GFRAA >60 04/19/2020 0858     No results found.     Assessment & Plan:   1. Peripheral vascular disease of lower extremity with ulceration  Recommend:  The patient has evidence of severe atherosclerotic changes of both lower extremities associated with ulceration and tissue loss of the bilateral feet.  This represents a limb threatening ischemia and places the patient at the risk for bilateral limb loss.  Patient should undergo angiography of the bilateral lower extremity with the hope for intervention for limb salvage.  The risks and benefits as well as the alternative therapies was discussed in detail with the patient.  All questions were answered.  Patient agrees to proceed with Left lower extremity followed by right lower extremity angiography.  The patient will follow up with me in the office after the procedure.   2. Protein-calorie malnutrition, severe This likely contributes significantly to the patient's surgical wound  healing  3. History of colon cancer The patient is planned to have a colectomy on 11/17/2022.  Given the patient's cancer this is currently prior to lower extremity intervention.  Additionally the patient will need to be placed on anticoagulation following intervention and ideally not he will for at least 3 months.  This will likely require some coordination in order to find the best  available timeframe post surgery with the patient's lower extremity intervention.   Current Outpatient Medications on File Prior to Visit  Medication Sig Dispense Refill   acetaminophen (TYLENOL) 325 MG tablet Take 650 mg by mouth 3 (three) times daily.      aspirin EC 81 MG tablet Take 1 tablet (81 mg total) by mouth daily. 150 tablet 2   atorvastatin (LIPITOR) 10 MG tablet Take 1 tablet (10 mg total) by mouth daily. 30 tablet 11   Calcium Carbonate Antacid 600 MG chewable tablet Chew 2 tablets (1,200 mg total) by mouth daily. 90 tablet 3   carboxymethylcellulose 1 % ophthalmic solution Apply 1 drop to eye 2 (two) times daily.     cholecalciferol (VITAMIN D3) 25 MCG (1000 UNIT) tablet Take 1 tablet (1,000 Units total) by mouth daily. 1 tablet 3   clopidogrel (PLAVIX) 75 MG tablet Take 75 mg by mouth daily.     cyclobenzaprine (FLEXERIL) 10 MG tablet Take 10 mg by mouth at bedtime.     escitalopram (LEXAPRO) 5 MG tablet Take 5 mg by mouth daily.     gabapentin (NEURONTIN) 100 MG capsule Take 100 mg by mouth 3 (three) times daily.     gentamicin ointment (GARAMYCIN) 0.1 % Apply 1 application. topically 3 (three) times daily. (Patient not taking: Reported on 10/28/2022)     levothyroxine (SYNTHROID) 25 MCG tablet Take 1 tablet (25 mcg total) by mouth daily before breakfast. (Patient not taking: Reported on 07/01/2022) 30 tablet 1   mirtazapine (REMERON) 7.5 MG tablet Take 7.5 mg by mouth at bedtime.     oxycodone (OXY-IR) 5 MG capsule Take 5 mg by mouth every 6 (six) hours. (Patient not taking: Reported on 10/28/2022)     Oxycodone HCl 10 MG TABS Take 5 mg by mouth every 6 (six) hours as needed for pain.     pantoprazole (PROTONIX) 40 MG tablet Take 40 mg by mouth daily. (Patient not taking: Reported on 07/01/2022)     senna-docusate (SENOKOT-S) 8.6-50 MG tablet Take 1 tablet by mouth daily.     tamsulosin (FLOMAX) 0.4 MG CAPS capsule Take 1 capsule (0.4 mg total) by mouth daily after supper. 30  capsule 3   Current Facility-Administered Medications on File Prior to Visit  Medication Dose Route Frequency Provider Last Rate Last Admin   heparin lock flush 100 UNIT/ML injection             There are no Patient Instructions on file for this visit. No follow-ups on file.   Georgiana Spinner, NP

## 2022-11-07 ENCOUNTER — Encounter
Admission: RE | Admit: 2022-11-07 | Discharge: 2022-11-07 | Disposition: A | Discharge status: Home / Self Care | Payer: Medicaid Other | Source: Ambulatory Visit | Attending: General Surgery | Admitting: General Surgery

## 2022-11-07 ENCOUNTER — Other Ambulatory Visit: Payer: Self-pay

## 2022-11-07 VITALS — BP 111/77 | HR 83 | Resp 15 | Ht 67.0 in | Wt 160.0 lb

## 2022-11-07 DIAGNOSIS — A419 Sepsis, unspecified organism: Secondary | ICD-10-CM | POA: Diagnosis not present

## 2022-11-07 DIAGNOSIS — D500 Iron deficiency anemia secondary to blood loss (chronic): Secondary | ICD-10-CM

## 2022-11-07 DIAGNOSIS — L97909 Non-pressure chronic ulcer of unspecified part of unspecified lower leg with unspecified severity: Secondary | ICD-10-CM | POA: Diagnosis not present

## 2022-11-07 DIAGNOSIS — I739 Peripheral vascular disease, unspecified: Secondary | ICD-10-CM | POA: Insufficient documentation

## 2022-11-07 DIAGNOSIS — Z01810 Encounter for preprocedural cardiovascular examination: Secondary | ICD-10-CM

## 2022-11-07 DIAGNOSIS — Z01818 Encounter for other preprocedural examination: Secondary | ICD-10-CM | POA: Insufficient documentation

## 2022-11-07 HISTORY — DX: Other obstructive and reflux uropathy: N40.1

## 2022-11-07 HISTORY — DX: Malignant neoplasm of colon, unspecified: C18.9

## 2022-11-07 HISTORY — DX: Benign prostatic hyperplasia with lower urinary tract symptoms: N13.8

## 2022-11-07 HISTORY — DX: Unspecified atherosclerosis of native arteries of extremities, unspecified extremity: I70.209

## 2022-11-07 HISTORY — DX: Pressure ulcer of unspecified site, unspecified stage: L89.90

## 2022-11-07 HISTORY — DX: Unspecified protein-calorie malnutrition: E46

## 2022-11-07 LAB — TYPE AND SCREEN

## 2022-11-07 LAB — URINE CULTURE: Culture: 60000 — AB

## 2022-11-07 NOTE — Progress Notes (Signed)
STAVROS, CAIL ([id number]) [id number] Page 1 of 14 Visit Report for 11/06/2022 Arrival Information Details Patient Name: Date of Service: GURTAJ, RUZ 11/06/2022 8:30 A M Medical Record Number: [id number] Patient Account Number: [id number] Date of Birth/Sex: Treating RN: January 27, 1961 (62 y.o. [name] Primary Care Angele Wiemann: [name] Other Clinician: Referring Berlie Persky: Treating Arshia Rondon/Extender: [name] in Treatment: 32 Visit Information History Since Last Visit Added or deleted any medications: No Patient Arrived: Wheel Chair Has Dressing in Place as Prescribed: Yes Arrival Time: 07:50 Pain Present Now: No Accompanied By: self Transfer Assistance: None Patient Identification Verified: Yes Secondary Verification Process Completed: Yes Patient Requires Transmission-Based No Precautions: Patient Has Alerts: Yes Patient Alerts: Patient on Blood Thinner NOT diabetic aspirin 81mg  Lives  [facility] ABI R 0.81 11/05/22 ABI L 0.59 11/05/22 Electronic Signature(s) Signed: 11/07/2022 12:55:54 PM By: Midge Aver MSN RN CNS WTA Entered By: Midge Aver on 11/06/2022 09:19:22 -------------------------------------------------------------------------------- Clinic Level of Care Assessment Details Patient Name: Date of Service: JOEZIAH, VOIT 11/06/2022 8:30 A M Medical Record Number: [id number] Patient Account Number: [id number] Date of Birth/Sex: Treating RN: 06-02-1961 (62 y.o. [name] Primary Care Talyn Eddie: [name] Other Clinician: Referring Tarahji Ramthun: Treating Naol Ontiveros/Extender: [name] in Treatment: 47 Clinic Level of Care Assessment Items TOOL 4 Quantity Score X- 1 0 Use when only an EandM is performed on FOLLOW-UP visit ASSESSMENTS - Nursing Assessment / Reassessment X- 1 10 Reassessment of Co-morbidities (includes updates in patient status) X- 1 5 Reassessment of  Adherence to Treatment Plan ASSESSMENTS - Wound and Skin A ssessment / Reassessment []  - 0 Simple Wound Assessment / Reassessment - one wound X- 5 5 Complex Wound Assessment / Reassessment - multiple wounds []  - 0 Dermatologic / Skin Assessment (not related to wound area) ASSESSMENTS - Focused Assessment []  - 0 Circumferential Edema Measurements - multi extremities []  - 0 Nutritional Assessment / Counseling / Intervention []  - 0 Lower Extremity Assessment (monofilament, tuning fork, pulses) []  - 0 Peripheral Arterial Disease Assessment (using hand held doppler) ASSESSMENTS - Ostomy and/or Continence Assessment and Care []  - 0 Incontinence Assessment and Management []  - 0 Ostomy Care Assessment and Management (repouching, etc.) [name] ([id number]) [id number] Page 2 of 14 PROCESS - Coordination of Care []  - 0 Simple Patient / Family Education for ongoing care X- 1 20 Complex (extensive) Patient / Family Education for ongoing care X- 1 10 Staff obtains Chiropractor, Records, T Results / Process Orders est []  - 0 Staff telephones HHA, Nursing Homes / Clarify orders / etc []  - 0 Routine Transfer to another Facility (non-emergent condition) []  - 0 Routine Hospital Admission (non-emergent condition) []  - 0 New Admissions / Manufacturing engineer / Ordering NPWT Apligraf, etc. , []  - 0 Emergency Hospital Admission (emergent condition) []  - 0 Simple Discharge Coordination X- 1 15 Complex (extensive) Discharge Coordination PROCESS - Special Needs []  - 0 Pediatric / Minor Patient Management []  - 0 Isolation Patient Management []  - 0 Hearing / Language / Visual special needs []  - 0 Assessment of Community assistance (transportation, D/C planning, etc.) []  - 0 Additional assistance / Altered mentation []  - 0 Support Surface(s) Assessment (bed, cushion, seat, etc.) INTERVENTIONS - Wound Cleansing / Measurement []  - 0 Simple Wound  Cleansing - one wound X- 5 5 Complex Wound Cleansing - multiple wounds X- 1 5 Wound Imaging (photographs - any number of wounds) []  - 0 Wound Tracing (instead of photographs) []  - 0 Simple Wound  Measurement - one wound X- 5 5 Complex Wound Measurement - multiple wounds INTERVENTIONS - Wound Dressings X - Small Wound Dressing one or multiple wounds 5 10 []  - 0 Medium Wound Dressing one or multiple wounds []  - 0 Large Wound Dressing one or multiple wounds X- 1 5 Application of Medications - topical []  - 0 Application of Medications - injection INTERVENTIONS - Miscellaneous []  - 0 External ear exam []  - 0 Specimen Collection (cultures, biopsies, blood, body fluids, etc.) []  - 0 Specimen(s) / Culture(s) sent or taken to Lab for analysis []  - 0 Patient Transfer (multiple staff / Nurse, adult / Similar devices) []  - 0 Simple Staple / Suture removal (25 or less) []  - 0 Complex Staple / Suture removal (26 or more) []  - 0 Hypo / Hyperglycemic Management (close monitor of Blood Glucose) []  - 0 Ankle / Brachial Index (ABI) - do not check if billed separately X- 1 5 Vital Signs Has the patient been seen at the hospital within the last three years: Yes Total Score: 200 Level Of Care: New/Established - Level 5 Electronic Signature(s) KAIYEN, FEKETE ([id number]) [id number] Page 3 of 14 Signed: 11/07/2022 12:55:54 PM By: Midge Aver MSN RN CNS WTA Entered By: Midge Aver on 11/06/2022 09:22:55 -------------------------------------------------------------------------------- Encounter Discharge Information Details Patient Name: Date of Service: EVERETT, MYRACLE BERT 11/06/2022 8:30 A M Medical Record Number: [id number] Patient Account Number: [id number] Date of Birth/Sex: Treating RN: 19-Feb-1961 (62 y.o. [name] Primary Care Inola Lisle: [name] Other Clinician: Referring Treylen Gibbs: Treating Ashtynn Berke/Extender: [name] in  Treatment: 71 Encounter Discharge Information Items Post Procedure Vitals Discharge Condition: Stable Temperature (F): 97.9 Ambulatory Status: Ambulatory Pulse (bpm): 89 Discharge Destination: Home Respiratory Rate (breaths/min): 16 Transportation: Private Auto Blood Pressure (mmHg): 126/79 Accompanied By: self Schedule Follow-up Appointment: Yes Clinical Summary of Care: Electronic Signature(s) Signed: 11/07/2022 12:55:54 PM By: Midge Aver MSN RN CNS WTA Entered By: Midge Aver on 11/06/2022 09:25:42 -------------------------------------------------------------------------------- Lower Extremity Assessment Details Patient Name: Date of Service: HARLEM, SAUBER 11/06/2022 8:30 A M Medical Record Number: [id number] Patient Account Number: [id number] Date of Birth/Sex: Treating RN: 1960-08-28 (62 y.o. [name] Primary Care Haiden Rawlinson: [name] Other Clinician: Referring Liliana Brentlinger: Treating Miran Kautzman/Extender: [name] Weeks in Treatment: 47 Edema Assessment Assessed: Kyra Searles: Yes] Franne Forts: Yes] Edema: [Left: Yes] [Right: No] Calf Left: Right: Point of Measurement: 33 cm From Medial Instep 37.5 cm 36 cm Ankle Left: Right: Point of Measurement: 12 cm From Medial Instep 24 cm 23 cm Vascular Assessment Pulses: Dorsalis Pedis Palpable: [Left:Yes] [Right:Yes] Electronic Signature(s) Signed: 11/07/2022 12:55:54 PM By: Midge Aver MSN RN CNS WTA Entered By: Midge Aver on 11/06/2022 08:38:24 -------------------------------------------------------------------------------- Multi Wound Chart Details Patient Name: Date of Service: [name] 11/06/2022 8:30 A M Medical Record Number: [id number] Patient Account Number: [id number] Date of Birth/Sex: Treating RN: 1961/05/06 (62 y.o. Dwaine, Pesqueira, [name] ([id number]) [id number] Page 4 of 14 Primary Care Aayat Hajjar: [name] Other Clinician: Referring  Imajean Mcdermid: Treating Cordelle Dahmen/Extender: [name] in Treatment: 47 Vital Signs Height(in): 69 Pulse(bpm): 89 Weight(lbs): 150 Blood Pressure(mmHg): 126/79 Body Mass Index(BMI): 22.1 Temperature(F): 97.9 Respiratory Rate(breaths/min): 16 [12:Photos:] Left, Distal, Medial Ankle Left, Dorsal Foot Right Calcaneus Wound Location: Gradually Appeared Gradually Appeared Gradually Appeared Wounding Event: Venous Leg Ulcer Venous Leg Ulcer Pressure Ulcer Primary Etiology: Anemia, Chronic Obstructive Anemia, Chronic Obstructive Anemia, Chronic Obstructive Comorbid History: Pulmonary Disease (COPD), Coronary Pulmonary Disease (COPD), Coronary Pulmonary Disease (COPD), Coronary Artery Disease,  Peripheral Arterial Artery Disease, Peripheral Arterial Artery Disease, Peripheral Arterial Disease, Peripheral Venous Disease, Disease, Peripheral Venous Disease, Disease, Peripheral Venous Disease, Hepatitis C, Osteoarthritis, Hepatitis C, Osteoarthritis, Hepatitis C, Osteoarthritis, Neuropathy, Received Chemotherapy Neuropathy, Received Chemotherapy Neuropathy, Received Chemotherapy 07/12/2019 10/01/2022 09/26/2022 Date Acquired: 47 4 4 Weeks of Treatment: Open Open Open Wound Status: No No No Wound Recurrence: 1.3x0.6x0.1 3.7x0.7x0.2 1.3x0.8x0.1 Measurements L x W x D (cm) 0.613 2.034 0.817 A (cm) : rea 0.061 0.407 0.082 Volume (cm) : 91.10% 13.70% 13.30% % Reduction in Area: 95.60% -72.50% 56.40% % Reduction in Volume: Full Thickness Without Exposed Full Thickness Without Exposed Category/Stage III Classification: Support Structures Support Structures Medium Medium Medium Exudate A mount: Serosanguineous Serosanguineous Serosanguineous Exudate Type: red, brown red, brown red, brown Exudate Color: Distinct, outline attached N/A N/A Wound Margin: Large (67-100%) None Present (0%) Medium (34-66%) Granulation Amount: Pink N/A Red Granulation Quality: Small  (1-33%) Large (67-100%) Medium (34-66%) Necrotic Amount: Adherent Slough Adherent [facility] Adherent Slough Necrotic Tissue: Fat Layer (Subcutaneous Tissue): Yes Fascia: No Fat Layer (Subcutaneous Tissue): Yes Exposed Structures: Fascia: No Fat Layer (Subcutaneous Tissue): No Fascia: No Tendon: No Tendon: No Tendon: No Muscle: No Muscle: No Muscle: No Joint: No Joint: No Joint: No Bone: No Bone: No Bone: No None None None Epithelialization: Wound Number: [id number] 18 N/A Photos: N/A Left, Anterior Lower Leg Right, Medial T Second oe N/A Wound Location: Pressure Injury Pressure Injury N/A Wounding Event: Pressure Ulcer Pressure Ulcer N/A Primary Etiology: Anemia, Chronic Obstructive Anemia, Chronic Obstructive N/A Comorbid History: Pulmonary Disease (COPD), Coronary Pulmonary Disease (COPD), Coronary Artery Disease, Peripheral Arterial Artery Disease, Peripheral Arterial Disease, Peripheral Venous Disease, Disease, Peripheral Venous Disease, Hepatitis C, Osteoarthritis, Hepatitis C, Osteoarthritis, Neuropathy, Received Chemotherapy Neuropathy, Received Chemotherapy 09/30/2022 09/26/2022 N/A Date Acquired: 3 3 N/A Weeks of Treatment: Open Open N/A Wound Status: No No N/A Wound Recurrence: [name] ([id number]) [id number] Page 5 of 14 3.2x2x0.3 1x0.8x0.1 N/A Measurements L x W x D (cm) 5.027 0.628 N/A A (cm) : rea 1.508 0.063 N/A Volume (cm) : -21.90% -14.20% N/A % Reduction in A rea: -266.00% -14.50% N/A % Reduction in Volume: Category/Stage II Category/Stage II N/A Classification: Medium Medium N/A Exudate A mount: Serosanguineous Serosanguineous N/A Exudate Type: red, brown red, brown N/A Exudate Color: N/A N/A N/A Wound Margin: Small (1-33%) Small (1-33%) N/A Granulation A mount: Red Red, Pink N/A Granulation Quality: Medium (34-66%) Medium (34-66%) N/A Necrotic A mount: Eschar Adherent Slough N/A Necrotic  Tissue: Fascia: No Fascia: No N/A Exposed Structures: Fat Layer (Subcutaneous Tissue): No Fat Layer (Subcutaneous Tissue): No Tendon: No Tendon: No Muscle: No Muscle: No Joint: No Joint: No Bone: No Bone: No N/A Small (1-33%) N/A Epithelialization: Treatment Notes Electronic Signature(s) Signed: 11/07/2022 12:55:54 PM By: Midge Aver MSN RN CNS WTA Entered By: Midge Aver on 11/06/2022 08:55:28 -------------------------------------------------------------------------------- Multi-Disciplinary Care Plan Details Patient Name: Date of Service: [name] 11/06/2022 8:30 A M Medical Record Number: [id number] Patient Account Number: [id number] Date of Birth/Sex: Treating RN: 20-Dec-1960 (62 y.o. [name] Primary Care Sabryn Preslar: [name] Other Clinician: Referring Sadrac Zeoli: Treating Natash Berman/Extender: [name] in Treatment: 47 Active Inactive Necrotic Tissue Nursing Diagnoses: Impaired tissue integrity related to necrotic/devitalized tissue Knowledge deficit related to management of necrotic/devitalized tissue Goals: Necrotic/devitalized tissue will be minimized in the wound bed Date Initiated: 02/07/2022 Target Resolution Date: 12/26/2022 Goal Status: Active Patient/caregiver will verbalize understanding of reason and process for debridement of necrotic tissue Date Initiated: 02/07/2022 Date Inactivated: 11/06/2022 Target  Resolution Date: 03/29/2022 Goal Status: Met Interventions: Assess patient pain level pre-, during and post procedure and prior to discharge Provide education on necrotic tissue and debridement process Treatment Activities: Excisional debridement : 02/07/2022 Notes: Venous Leg Ulcer Nursing Diagnoses: Knowledge deficit related to disease process and management Goals: Patient will maintain optimal edema control Date Initiated: 12/06/2021 Date Inactivated: 12/27/2021 Target Resolution Date: 01/03/2022 Goal Status:  Met Patient/caregiver will verbalize understanding of disease process and disease management QUENTYN, KOLBECK ([id number]) [id number] Page 6 of 14 Date Initiated: 12/06/2021 Target Resolution Date: 10/28/2022 Goal Status: Active Interventions: Assess peripheral edema status every visit. Compression as ordered Notes: Electronic Signature(s) Signed: 11/07/2022 12:55:54 PM By: Midge Aver MSN RN CNS WTA Entered By: Midge Aver on 11/06/2022 09:23:50 -------------------------------------------------------------------------------- Pain Assessment Details Patient Name: Date of Service: CLARA, SMOLEN 11/06/2022 8:30 A M Medical Record Number: [id number] Patient Account Number: [id number] Date of Birth/Sex: Treating RN: 10-01-1960 (62 y.o. [name] Primary Care Nailyn Dearinger: [name] Other Clinician: Referring Efosa Treichler: Treating Keyasha Miah/Extender: [name] Weeks in Treatment: 47 Active Problems Location of Pain Severity and Description of Pain Patient Has Paino No Site Locations Pain Management and Medication Current Pain Management: Electronic Signature(s) Signed: 11/07/2022 12:55:54 PM By: Midge Aver MSN RN CNS WTA Entered By: Midge Aver on 11/06/2022 09:19:30 -------------------------------------------------------------------------------- Patient/Caregiver Education Details Patient Name: Date of Service: Dunigan, RO BERT 4/11/2024andnbsp8:30 A M Medical Record Number: [id number] Patient Account Number: [id number] Date of Birth/Gender: Treating RN: 25-Feb-1961 (62 y.o. [name] Primary Care Physician: [name] Other Clinician: Referring Physician: Treating Physician/Extender: [name] in Treatment: 92 Education Assessment Education Provided To: Patient DEARIES, MEIKLE ([id number]) [id number] Page 7 of 14 Education Topics Provided Wound/Skin Impairment: Handouts:  Caring for Your Ulcer Methods: Explain/Verbal Responses: State content correctly Electronic Signature(s) Signed: 11/07/2022 12:55:54 PM By: Midge Aver MSN RN CNS WTA Entered By: Midge Aver on 11/06/2022 09:24:02 -------------------------------------------------------------------------------- Wound Assessment Details Patient Name: Date of Service: LEONEL, MCCOLLUM 11/06/2022 8:30 A M Medical Record Number: [id number] Patient Account Number: [id number] Date of Birth/Sex: Treating RN: [birth year] (62 y.o. [name] Primary Care Reagan Klemz: [name] Other Clinician: Referring Ayyan Sites: Treating Jemell Town/Extender: [name] Weeks in Treatment: 47 Wound Status Wound Number: 12 Primary Venous Leg Ulcer Etiology: Wound Location: Left, Distal, Medial Ankle Wound Open Wounding Event: Gradually Appeared Status: Date Acquired: 07/12/2019 Comorbid Anemia, Chronic Obstructive Pulmonary Disease (COPD), Coronary Weeks Of Treatment: 47 History: Artery Disease, Peripheral Arterial Disease, Peripheral Venous Clustered Wound: No Disease, Hepatitis C, Osteoarthritis, Neuropathy, Received Chemotherapy Photos Wound Measurements Length: (cm) 1.3 Width: (cm) 0.6 Depth: (cm) 0.1 Area: (cm) 0.613 Volume: (cm) 0.061 % Reduction in Area: 91.1% % Reduction in Volume: 95.6% Epithelialization: None Wound Description Classification: Full Thickness Without Exposed Support Wound Margin: Distinct, outline attached Exudate Amount: Medium Exudate Type: Serosanguineous Exudate Color: red, brown Structures Foul Odor After Cleansing: No Slough/Fibrino Yes Wound Bed Granulation Amount: Large (67-100%) Exposed Structure Granulation Quality: Pink Fascia Exposed: No Necrotic Amount: Small (1-33%) Fat Layer (Subcutaneous Tissue) Exposed: Yes Necrotic Quality: Adherent Slough Tendon Exposed: No Muscle Exposed: No Joint Exposed: No Bone Exposed: No Treatment Notes Wound #12  (Ankle) Wound Laterality: Left, Medial, Distal Guidotti, Macedonio ([id number]) [id number] Page 8 of 14 Cleanser Soap and Water Discharge Instruction: Gently cleanse wound with antibacterial soap, rinse and pat dry prior to dressing wounds Wound Cleanser Discharge Instruction: Wash your hands with soap and water. Remove old dressing, discard into plastic bag and place into  trash. Cleanse the wound with Wound Cleanser prior to applying a clean dressing using gauze sponges, not tissues or cotton balls. Do not scrub or use excessive force. Pat dry using gauze sponges, not tissue or cotton balls. Peri-Wound Care AandD Ointment Discharge Instruction: Apply AandD Ointment to areas of redness Topical Primary Dressing Hydrofera Blue Ready Transfer Foam, 4x5 (in/in) Discharge Instruction: Apply Hydrofera Blue Ready to wound bed as directed Secondary Dressing ABD Pad 5x9 (in/in) Discharge Instruction: Cover with ABD pad Secured With Kerlix Roll Sterile or Non-Sterile 6-ply 4.5x4 (yd/yd) Discharge Instruction: Apply Kerlix as directed Compression Wrap Compression Stockings Add-Ons Electronic Signature(s) Signed: 11/07/2022 12:55:54 PM By: Midge Aver MSN RN CNS WTA Entered By: Midge Aver on 11/06/2022 08:20:30 -------------------------------------------------------------------------------- Wound Assessment Details Patient Name: Date of Service: [name] 11/06/2022 8:30 A M Medical Record Number: [id number] Patient Account Number: [id number] Date of Birth/Sex: Treating RN: 1960/08/08 (62 y.o. [name] Primary Care Vedansh Kerstetter: [name] Other Clinician: Referring Monserratt Knezevic: Treating Martez Weiand/Extender: [name] Weeks in Treatment: 47 Wound Status Wound Number: 15 Primary Venous Leg Ulcer Etiology: Wound Location: Left, Dorsal Foot Wound Open Wounding Event: Gradually Appeared Status: Date Acquired: 10/01/2022 Comorbid Anemia,  Chronic Obstructive Pulmonary Disease (COPD), Coronary Weeks Of Treatment: 4 History: Artery Disease, Peripheral Arterial Disease, Peripheral Venous Clustered Wound: No Disease, Hepatitis C, Osteoarthritis, Neuropathy, Received Chemotherapy Photos Wound Measurements Length: (cm) 3.7 Width: (cm) 0.7 Schneider, Donnivan ([id number]) Depth: (cm) 0.2 Area: (cm) 2.034 Volume: (cm) 0.407 % Reduction in Area: 13.7% % Reduction in Volume: -72.5% [id number] Page 9 of 14 Epithelialization: None Wound Description Classification: Full Thickness Without Exposed Support Structures Exudate Amount: Medium Exudate Type: Serosanguineous Exudate Color: red, brown Foul Odor After Cleansing: No Slough/Fibrino Yes Wound Bed Granulation Amount: None Present (0%) Exposed Structure Necrotic Amount: Large (67-100%) Fascia Exposed: No Necrotic Quality: Adherent Slough Fat Layer (Subcutaneous Tissue) Exposed: No Tendon Exposed: No Muscle Exposed: No Joint Exposed: No Bone Exposed: No Treatment Notes Wound #15 (Foot) Wound Laterality: Dorsal, Left Cleanser Soap and Water Discharge Instruction: Gently cleanse wound with antibacterial soap, rinse and pat dry prior to dressing wounds Wound Cleanser Discharge Instruction: Wash your hands with soap and water. Remove old dressing, discard into plastic bag and place into trash. Cleanse the wound with Wound Cleanser prior to applying a clean dressing using gauze sponges, not tissues or cotton balls. Do not scrub or use excessive force. Pat dry using gauze sponges, not tissue or cotton balls. Peri-Wound Care AandD Ointment Discharge Instruction: Apply AandD Ointment to areas of redness Topical Santyl Collagenase Ointment, 30 (gm), tube Discharge Instruction: apply nickel thick to wound bed only Primary Dressing Gauze Discharge Instruction: As directed: dry, moistened with saline or moistened with Dakins Solution Secondary  Dressing ABD Pad 5x9 (in/in) Discharge Instruction: Cover with ABD pad Secured With Kerlix Roll Sterile or Non-Sterile 6-ply 4.5x4 (yd/yd) Discharge Instruction: Apply Kerlix as directed Compression Wrap Compression Stockings Add-Ons Electronic Signature(s) Signed: 11/07/2022 12:55:54 PM By: Midge Aver MSN RN CNS WTA Entered By: Midge Aver on 11/06/2022 08:21:25 -------------------------------------------------------------------------------- Wound Assessment Details Patient Name: Date of Service: [name] 11/06/2022 8:30 A M Medical Record Number: [id number] Patient Account Number: [id number] Date of Birth/Sex: Treating RN: 1960-10-26 (62 y.o. [name] Primary Care Ubah Radke: [name] Other Clinician: Referring Bani Gianfrancesco: Treating Tuck Dulworth/Extender: [name] Weeks in Treatment: [address] Wound Status [name] ([id number]) [id number] Page 10 of 14 Wound Number: 16 Primary Pressure Ulcer Etiology: Wound Location: Right Calcaneus Wound  Open Wounding Event: Gradually Appeared Status: Date Acquired: 09/26/2022 Comorbid Anemia, Chronic Obstructive Pulmonary Disease (COPD), Coronary Weeks Of Treatment: 4 History: Artery Disease, Peripheral Arterial Disease, Peripheral Venous Clustered Wound: No Disease, Hepatitis C, Osteoarthritis, Neuropathy, Received Chemotherapy Photos Wound Measurements Length: (cm) 1.3 Width: (cm) 0.8 Depth: (cm) 0.1 Area: (cm) 0.817 Volume: (cm) 0.082 % Reduction in Area: 13.3% % Reduction in Volume: 56.4% Epithelialization: None Wound Description Classification: Category/Stage III Exudate Amount: Medium Exudate Type: Serosanguineous Exudate Color: red, brown Foul Odor After Cleansing: No Slough/Fibrino Yes Wound Bed Granulation Amount: Medium (34-66%) Exposed Structure Granulation Quality: Red Fascia Exposed: No Necrotic Amount: Medium (34-66%) Fat Layer (Subcutaneous Tissue)  Exposed: Yes Necrotic Quality: Adherent Slough Tendon Exposed: No Muscle Exposed: No Joint Exposed: No Bone Exposed: No Treatment Notes Wound #16 (Calcaneus) Wound Laterality: Right Cleanser Soap and Water Discharge Instruction: Gently cleanse wound with antibacterial soap, rinse and pat dry prior to dressing wounds Wound Cleanser Discharge Instruction: Wash your hands with soap and water. Remove old dressing, discard into plastic bag and place into trash. Cleanse the wound with Wound Cleanser prior to applying a clean dressing using gauze sponges, not tissues or cotton balls. Do not scrub or use excessive force. Pat dry using gauze sponges, not tissue or cotton balls. Peri-Wound Care AandD Ointment Discharge Instruction: Apply AandD Ointment to areas of redness Topical Santyl Collagenase Ointment, 30 (gm), tube Discharge Instruction: apply nickel thick to wound bed only Primary Dressing Gauze Discharge Instruction: As directed: dry, moistened with saline or moistened with Dakins Solution Secondary Dressing ABD Pad 5x9 (in/in) Discharge Instruction: Cover with ABD pad Secured With [name] ([id number]) [id number] Page 11 of 14 Kerlix Roll Sterile or Non-Sterile 6-ply 4.5x4 (yd/yd) Discharge Instruction: Apply Kerlix as directed Compression Wrap Compression Stockings Add-Ons Electronic Signature(s) Signed: 11/07/2022 12:55:54 PM By: Midge Aver MSN RN CNS WTA Entered By: Midge Aver on 11/06/2022 08:21:51 -------------------------------------------------------------------------------- Wound Assessment Details Patient Name: Date of Service: JEROLD, YOSS 11/06/2022 8:30 A M Medical Record Number: [id number] Patient Account Number: [id number] Date of Birth/Sex: Treating RN: Dec 08, 1960 (62 y.o. [name] Primary Care Pavle Wiler: [name] Other Clinician: Referring Karolyn Messing: Treating Mabel Unrein/Extender: [name] Weeks in Treatment: 47 Wound Status Wound Number: 17 Primary Pressure Ulcer Etiology: Wound Location: Left, Anterior Lower Leg Wound Open Wounding Event: Pressure Injury Status: Date Acquired: 09/30/2022 Comorbid Anemia, Chronic Obstructive Pulmonary Disease (COPD), Coronary Weeks Of Treatment: 3 History: Artery Disease, Peripheral Arterial Disease, Peripheral Venous Clustered Wound: No Disease, Hepatitis C, Osteoarthritis, Neuropathy, Received Chemotherapy Photos Wound Measurements Length: (cm) 3.2 Width: (cm) 2 Depth: (cm) 0.3 Area: (cm) 5.027 Volume: (cm) 1.508 % Reduction in Area: -21.9% % Reduction in Volume: -266% Wound Description Classification: Category/Stage II Exudate Amount: Medium Exudate Type: Serosanguineous Exudate Color: red, brown Foul Odor After Cleansing: No Slough/Fibrino Yes Wound Bed Granulation Amount: Small (1-33%) Exposed Structure Granulation Quality: Red Fascia Exposed: No Necrotic Amount: Medium (34-66%) Fat Layer (Subcutaneous Tissue) Exposed: No Necrotic Quality: Eschar Tendon Exposed: No Muscle Exposed: No Joint Exposed: No Bone Exposed: No Treatment Notes Wound #17 (Lower Leg) Wound Laterality: Left, Anterior [name] ([id number]) [id number] Page 12 of 14 Cleanser Soap and Water Discharge Instruction: Gently cleanse wound with antibacterial soap, rinse and pat dry prior to dressing wounds Wound Cleanser Discharge Instruction: Wash your hands with soap and water. Remove old dressing, discard into plastic bag and place into trash. Cleanse the wound with Wound Cleanser prior to applying a clean dressing using gauze sponges, not  tissues or cotton balls. Do not scrub or use excessive force. Pat dry using gauze sponges, not tissue or cotton balls. Peri-Wound Care AandD Ointment Discharge Instruction: Apply AandD Ointment to areas of redness Topical Santyl Collagenase Ointment, 30 (gm),  tube Discharge Instruction: apply nickel thick to wound bed only Primary Dressing Algicell Calcium Alginate Dressing, 4X5 (in/in) Secondary Dressing ABD Pad 5x9 (in/in) Discharge Instruction: Cover with ABD pad Secured With Kerlix Roll Sterile or Non-Sterile 6-ply 4.5x4 (yd/yd) Discharge Instruction: Apply Kerlix as directed Compression Wrap Compression Stockings Add-Ons Electronic Signature(s) Signed: 11/07/2022 12:55:54 PM By: Midge Aver MSN RN CNS WTA Entered By: Midge Aver on 11/06/2022 08:22:19 -------------------------------------------------------------------------------- Wound Assessment Details Patient Name: Date of Service: [name] 11/06/2022 8:30 A M Medical Record Number: [id number] Patient Account Number: [id number] Date of Birth/Sex: Treating RN: 1960/12/01 (62 y.o. [name] Primary Care Greig Altergott: [name] Other Clinician: Referring Kamber Vignola: Treating Briley Sulton/Extender: [name] Weeks in Treatment: 47 Wound Status Wound Number: 18 Primary Pressure Ulcer Etiology: Wound Location: Right, Medial T Second oe Wound Open Wounding Event: Pressure Injury Status: Date Acquired: 09/26/2022 Comorbid Anemia, Chronic Obstructive Pulmonary Disease (COPD), Coronary Weeks Of Treatment: 3 History: Artery Disease, Peripheral Arterial Disease, Peripheral Venous Clustered Wound: No Disease, Hepatitis C, Osteoarthritis, Neuropathy, Received Chemotherapy Photos Wound Measurements Length: (cm) 1 Width: (cm) 0.8 Robenson, Madhav ([id number]) Depth: (cm) 0.1 Area: (cm) 0.628 Volume: (cm) 0.063 % Reduction in Area: -14.2% % Reduction in Volume: -14.5% [id number] Page 13 of 14 Epithelialization: Small (1-33%) Wound Description Classification: Category/Stage II Exudate Amount: Medium Exudate Type: Serosanguineous Exudate Color: red, brown Foul Odor After Cleansing: No Slough/Fibrino Yes Wound  Bed Granulation Amount: Small (1-33%) Exposed Structure Granulation Quality: Red, Pink Fascia Exposed: No Necrotic Amount: Medium (34-66%) Fat Layer (Subcutaneous Tissue) Exposed: No Necrotic Quality: Adherent Slough Tendon Exposed: No Muscle Exposed: No Joint Exposed: No Bone Exposed: No Treatment Notes Wound #18 (Toe Second) Wound Laterality: Right, Medial Cleanser Soap and Water Discharge Instruction: Gently cleanse wound with antibacterial soap, rinse and pat dry prior to dressing wounds Wound Cleanser Discharge Instruction: Wash your hands with soap and water. Remove old dressing, discard into plastic bag and place into trash. Cleanse the wound with Wound Cleanser prior to applying a clean dressing using gauze sponges, not tissues or cotton balls. Do not scrub or use excessive force. Pat dry using gauze sponges, not tissue or cotton balls. Peri-Wound Care AandD Ointment Discharge Instruction: Apply AandD Ointment to areas of redness Topical Santyl Collagenase Ointment, 30 (gm), tube Discharge Instruction: apply nickel thick to wound bed only Primary Dressing Gauze Discharge Instruction: As directed: dry, moistened with saline or moistened with Dakins Solution Secondary Dressing ABD Pad 5x9 (in/in) Discharge Instruction: Cover with ABD pad Secured With Kerlix Roll Sterile or Non-Sterile 6-ply 4.5x4 (yd/yd) Discharge Instruction: Apply Kerlix as directed Compression Wrap Compression Stockings Add-Ons Electronic Signature(s) Signed: 11/07/2022 12:55:54 PM By: Midge Aver MSN RN CNS WTA Entered By: Midge Aver on 11/06/2022 08:22:45 -------------------------------------------------------------------------------- Vitals Details Patient Name: Date of Service: [name] 11/06/2022 8:30 A M Medical Record Number: [id number] Patient Account Number: [id number] Date of Birth/Sex: Treating RN: 04-30-1961 (62 y.o. [name] Primary Care Adilson Grafton: [name] Other  Clinician: Referring Kendel Pesnell: Treating Orli Degrave/Extender: [name] Weeks in Treatment: [address] FABION, GATSON ([id number]) [id number] Page 14 of 14 Time Taken: 08:04 Temperature (F): 97.9 Height (in): 69 Pulse (bpm): 89 Weight (lbs): 150 Respiratory Rate (breaths/min): 16 Body Mass Index (BMI): 22.1  Blood Pressure (mmHg): 126/79 Reference Range: 80 - 120 mg / dl Electronic Signature(s) Signed: 11/07/2022 12:55:54 PM By: Midge Aver MSN RN CNS WTA Entered By: Midge Aver on 11/06/2022 09:19:26

## 2022-11-07 NOTE — Patient Instructions (Signed)
Your procedure is scheduled on: Monday 11/17/22 To find out your arrival time, please call [phone] between 1PM - 3PM on:   Friday 11/13/22 Report to the Registration Desk on the 1st floor of the [facility]. Valet parking is available.  If your arrival time is 6:00 am, do not arrive before that time as the [facility] entrance doors do not open until 6:00 am.  REMEMBER: Instructions that are not followed completely may result in serious medical risk, up to and including death; or upon the discretion of your surgeon and anesthesiologist your surgery may need to be rescheduled.  PRE OPERATIVE DIET AS INSTRUCTED BY SURGEON DR Maia Plan DIAZ  One week prior to surgery: Stop Anti-inflammatories (NSAIDS) such as Advil, Aleve, Ibuprofen, Motrin, Naproxen, Naprosyn and Aspirin based products such as Excedrin, Goody's Powder, BC Powder. You may however, continue to take Tylenol if needed for pain up until the day of surgery.  Stop ANY OVER THE COUNTER supplements until after surgery.   Continue taking all prescribed medications with the exception of the following: Hold Aspirin and Plavix as instructed.  Follow recommendations from Cardiologist or PCP regarding stopping blood thinners.  TAKE ONLY THESE MEDICATIONS THE MORNING OF SURGERY WITH A SIP OF WATER:  atorvastatin (LIPITOR) 10 MG tablet  escitalopram (LEXAPRO) 5 MG tablet  oxycodone (OXY-IR) 5 MG capsule as needed  Fleets enema or bowel prep as directed.  No Alcohol for 24 hours before or after surgery.  No Smoking including e-cigarettes for 24 hours before surgery.  No chewable tobacco products for at least 6 hours before surgery.  No nicotine patches on the day of surgery.  Do not use any "recreational" drugs for at least a week (preferably 2 weeks) before your surgery.  Please be advised that the combination of cocaine and anesthesia may have negative outcomes, up to and including death. If you test positive for cocaine,  your surgery will be cancelled.  On the morning of surgery brush your teeth with toothpaste and water, you may rinse your mouth with mouthwash if you wish. Do not swallow any toothpaste or mouthwash.  Use CHG Soap or wipes as directed on instruction sheet.  Do not wear lotions, powders, or perfumes.   Do not shave body hair from the neck down 48 hours before surgery.  Wear comfortable clothing (specific to your surgery type) to the hospital.  Do not wear jewelry, make-up, hairpins, clips or nail polish.  Contact lenses, hearing aids and dentures may not be worn into surgery.  Do not bring valuables to the hospital. [facility] is not responsible for any missing/lost belongings or valuables.   Notify your doctor if there is any change in your medical condition (cold, fever, infection).  If you are being discharged the day of surgery, you will not be allowed to drive home. You will need a responsible individual to drive you home and stay with you for 24 hours after surgery.   If you are taking public transportation, you will need to have a responsible individual with you.  If you are being admitted to the hospital overnight, leave your suitcase in the car. After surgery it may be brought to your room.  In case of increased patient census, it may be necessary for you, the patient, to continue your postoperative care in the Same Day Surgery department.  After surgery, you can help prevent lung complications by doing breathing exercises.  Take deep breaths and cough every 1-2 hours. Your doctor may  order a device called an Incentive Spirometer to help you take deep breaths. When coughing or sneezing, hold a pillow firmly against your incision with both hands. This is called "splinting." Doing this helps protect your incision. It also decreases belly discomfort.  Surgery Visitation Policy:  Patients undergoing a surgery or procedure may have two family members or support persons with  them as long as the person is not COVID-19 positive or experiencing its symptoms.   Inpatient Visitation:    Visiting hours are 7 a.m. to 8 p.m. Up to four visitors are allowed at one time in a patient room. The visitors may rotate out with other people during the day. One designated support person (adult) may remain overnight.  Please call the Pre-admissions Testing Dept. at [phone] if you have any questions about these instructions.  Preparing the Skin Before Surgery     To help prevent the risk of infection at your surgical site, we are now providing you with rinse-free Sage 2% Chlorhexidine Gluconate (CHG) disposable wipes.  Chlorhexidine Gluconate (CHG) Soap  o An antiseptic cleaner that kills germs and bonds with the skin to continue killing germs even after washing  o Used for showering the night before surgery and morning of surgery  The night before surgery: Shower or bathe with warm water. Do not apply perfume, lotions, powders. Wait one hour after shower. Skin should be dry and cool. Open Sage wipe package - use 6 disposable cloths. Wipe body using one cloth for the right arm, one cloth for the left arm, one cloth for the right leg, one cloth for the left leg, one cloth for the chest/abdomen area, and one cloth for the back. Do not use on open wounds or sores. Do not use on face or genitals (private parts). If you are breast feeding, do not use on breasts. 5. Do not rinse, allow to dry. 6. Skin may feel "tacky" for several minutes. 7. Dress in clean clothes. 8. Place clean sheets on your bed and do not sleep with pets.  REPEAT ABOVE ON THE MORNING OF SURGERY BEFORE ARRIVING TO THE HOSPITAL.

## 2022-11-07 NOTE — Consult Note (Addendum)
WOC Nurse requested for preoperative stoma site marking  Discussed surgical procedure and stoma creation with patient. Explained role of the WOC nurse team. Provided the patient with educational booklet and provided samples of pouching options.  Answered patient's questions. He lives at a facility and states they will take care of his ostomy needs.  Examined patient sitting [place] in a wheelchair; he states he spends most of his time in this position, in order to place the marking in the patient's visual field, away from any creases or abdominal contour issues and within the rectus muscle.  Attempted to mark below the patient's belt line, but this was not possible, since a significant crease is located lower on the abd which should be avoided if possible.   Marked for colostomy in the LLQ  __8__ cm to the left of the umbilicus and __2__cm above the umbilicus.  Marked for ileostomy in the RLQ  _8___cm to the right of the umbilicus and  _2___ cm above the umbilicus.  Patient's abdomen cleansed with CHG wipes at site markings, allowed to air dry prior to marking.Covered mark with thin film transparent dressings to preserve mark until date of surgery.   WOC Nurse team will follow up with patient after surgery for continued ostomy care and teaching. Thank-you,  Cammie Mcgee MSN, RN, CWOCN, [place], CNS [phone]

## 2022-11-07 NOTE — Progress Notes (Addendum)
Patient states his son is only his medical POA he has no Legal Guardian. He lives at [facility]. He is alert and oriented.

## 2022-11-08 LAB — TYPE AND SCREEN
ABO/RH(D): O NEG
Antibody Screen: POSITIVE

## 2022-11-08 NOTE — Progress Notes (Signed)
ED Antimicrobial Stewardship Positive Culture Follow Up   Nautica Kreutzer is an 62 y.o. male who presented to [facility] on 11/04/2022 with a chief complaint of  Chief Complaint  Patient presents with   Hematuria    Recent Results (from the past 720 hour(s))  Aerobic Culture w Gram Stain (superficial specimen)     Status: None   Collection Time: 10/30/22  9:04 AM   Specimen: Leg; Wound  Result Value Ref Range Status   Specimen Description   Final    LEG Performed at [facility], [address], [place], [place] [address]    Special Requests   Final    NONE Performed at [facility], [address] [place], [place] [address]    Gram Stain   Final    NO WBC SEEN RARE GRAM POSITIVE COCCI Performed at [facility], [address], [place], [place] [address]    Culture   Final    RARE STREPTOCOCCUS GROUP G RARE VIRIDANS STREPTOCOCCUS RARE PROVIDENCIA STUARTII FOR  PROVIDENCIA STUARTII Beta hemolytic streptococci are predictably susceptible to penicillin and other beta lactams. Susceptibility testing not routinely performed. FOR STREPTOCOCCUS GROUP G    Report Status 11/03/2022 FINAL  Final   Organism ID, Bacteria PROVIDENCIA STUARTII  Final   Organism ID, Bacteria VIRIDANS STREPTOCOCCUS  Final      Susceptibility   Providencia stuartii - MIC*    AMPICILLIN >=32 RESISTANT Resistant     CEFEPIME <=0.12 SENSITIVE Sensitive     CEFTAZIDIME <=1 SENSITIVE Sensitive     CEFTRIAXONE <=0.25 SENSITIVE Sensitive     CIPROFLOXACIN >=4 RESISTANT Resistant     GENTAMICIN RESISTANT Resistant     IMIPENEM 2 SENSITIVE Sensitive     TRIMETH/SULFA >=320 RESISTANT Resistant     AMPICILLIN/SULBACTAM >=32 RESISTANT Resistant     PIP/TAZO <=4 SENSITIVE Sensitive     * RARE PROVIDENCIA STUARTII   Viridans streptococcus - MIC*    PENICILLIN <=0.06 SENSITIVE Sensitive     CEFTRIAXONE <=0.12 SENSITIVE Sensitive     ERYTHROMYCIN <=0.12 SENSITIVE Sensitive      LEVOFLOXACIN 0.5 SENSITIVE Sensitive     VANCOMYCIN 0.5 SENSITIVE Sensitive     * RARE VIRIDANS STREPTOCOCCUS  Urine Culture     Status: Abnormal   Collection Time: 11/04/22  3:23 PM   Specimen: Urine, Random  Result Value Ref Range Status   Specimen Description   Final    URINE, RANDOM Performed at [facility], [address] [place], [place] [address]    Special Requests   Final    NONE Performed at [facility], [address] [place], [place] [address]    Culture 60,000 COLONIES/mL PROTEUS MIRABILIS (A)  Final   Report Status 11/07/2022 FINAL  Final   Organism ID, Bacteria PROTEUS MIRABILIS (A)  Final      Susceptibility   Proteus mirabilis - MIC*    AMPICILLIN <=2 SENSITIVE Sensitive     CEFAZOLIN <=4 SENSITIVE Sensitive     CEFEPIME <=0.12 SENSITIVE Sensitive     CEFTRIAXONE <=0.25 SENSITIVE Sensitive     CIPROFLOXACIN >=4 RESISTANT Resistant     GENTAMICIN <=1 SENSITIVE Sensitive     IMIPENEM 0.5 SENSITIVE Sensitive     NITROFURANTOIN 128 RESISTANT Resistant     TRIMETH/SULFA 40 SENSITIVE Sensitive     AMPICILLIN/SULBACTAM <=2 SENSITIVE Sensitive     PIP/TAZO <=4 SENSITIVE Sensitive     * 60,000 COLONIES/mL PROTEUS MIRABILIS    [x]   Patient discharged originally without antimicrobial agent  New antibiotic prescription: N/A  ED Provider: Dr. Larinda Buttery Pt here with penile bleeding. Afebrile, HDS and without dysuria. Discharged without antibiotics per urologist recommendation. Per ED provider, given Ucx shows <100,000 CFU/mL, antibiotic treatment not warranted  Will M. Dareen Piano, PharmD PGY-1 Pharmacy Resident 11/08/2022 3:18 PM

## 2022-11-11 ENCOUNTER — Inpatient Hospital Stay: Payer: Medicaid Other

## 2022-11-11 NOTE — Progress Notes (Signed)
Nutrition  Patient did not show up for scheduled nutrition visit today.  Message sent to scheduling to offer another appointment.  Referring provider notified.   Natosha Bou B. Freida Busman, RD, LDN Registered Dietitian [phone]

## 2022-11-13 ENCOUNTER — Encounter: Payer: Medicaid Other | Admitting: Physician Assistant

## 2022-11-13 DIAGNOSIS — L97322 Non-pressure chronic ulcer of left ankle with fat layer exposed: Secondary | ICD-10-CM | POA: Diagnosis not present

## 2022-11-13 NOTE — Progress Notes (Addendum)
Chad [name] ([id number]) [id number] Page 1 of 13 Visit Report for 11/13/2022 Chief Complaint Document Details Patient Name: Date of Service: Chad [name], Chad [name] 11/13/2022 9:00 A M Medical Record Number: [id number] Patient Account Number: [id number] Date of Birth/Sex: Treating RN: [birth year] (62 y.o. Chad [name] Primary Care Provider: Cyril [name] Other Clinician: Referring Provider: Treating Provider/Extender: Chad [name] Weeks in Treatment: 48 Information Obtained from: Patient Chief Complaint Left ankle ulcer, Left dorsal foot wound, right heel Electronic Signature(s) Signed: 11/13/2022 9:30:14 AM By: Chad Derry PA-C Entered By: Chad [name] on 11/13/2022 09:30:14 -------------------------------------------------------------------------------- HPI Details Patient Name: Date of Service: Chad [name] 11/13/2022 9:00 A M Medical Record Number: [id number] Patient Account Number: [id number] Date of Birth/Sex: Treating RN: November 06, 1960 (62 y.o. Chad [name] Primary Care Provider: Cyril [name] Other Clinician: Referring Provider: Treating Provider/Extender: Chad [name] Weeks in Treatment: 48 History of Present Illness HPI Description: 10/08/18 on evaluation today patient actually presents to our office for initial evaluation concerning wounds that he has of the bilateral lower extremities. 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 He also has bilateral venous stasis. He apparently has been homeless currently following discharge from the hospital apparently he has been placed at [facility] which is is a skilled nursing facility locally. Nonetheless fortunately he does not show any signs of infection at this time which is good news. In fact several of the wound  actually appears to be showing some signs of improvement already in my pinion. There are a couple areas in the left leg in particular there likely gonna require some sharp debridement to help clear away some necrotic tissue and help with more sufficient healing. No fevers, chills, nausea, or vomiting noted at this time. 10/15/18 on evaluation today patient actually appears to be doing very well in regard to his bilateral lower extremities. He's been tolerating the dressing changes without complication. Fortunately there does not appear to be any evidence of active infection at this time which is great news. Overall I'm actually very pleased with how this has progressed in just one visits time. Readmission: 08/14/2020 upon evaluation today patient presents for re-evaluation here in our clinic. He is having issues with his left ankle region as well as his right toe and his right heel. He tells me that the toe and heel actually began as a area that was itching that he was scratching and then subsequently opened up into wounds. These may have been abscess areas I presume based on what I am seeing currently. With regard to his left ankle region he tells me this was a similar type occurrence although he does have venous stasis this very well may be more of a venous leg ulcer more than anything. Nonetheless I do believe that the patient would benefit from appropriate and aggressive wound care to try to help get things under better control here. He does have history of a stroke on the left side affecting him to some degree there that he is able to stand although he does have some residual weakness. Otherwise again the patient does have chronic venous insufficiency as previously noted. His arterial studies most recently obtained showed that he had an ABI on the right of 1.16 with a TBI of 0.52 and on the left and ABI of 1.14 with a TBI of 0.81. That was obtained on  06/19/2020. 08/28/2020 upon evaluation today  patient appears to be doing decently well in regard to his wounds in general. He has been tolerating the dressing changes without complication. Fortunately there does not appear to be any signs of active infection which is great news. With that being said I think the [facility] is doing a good job I would recommend that we likely continue with that currently. 09/11/2020 upon evaluation today patient's wounds did not appear to be doing too poorly but again he is not really showing signs of significant improvement with regard to any of the wounds on the right. None of them have Hydrofera Blue on them I am not exactly sure why this is not being followed as the facility did not contact us to let us know of any issues with obtaining dressings or otherwise. With that being said he is supposed to be using Hydrofera Blue on both of the wounds on the right foot as well as the ankle wound on the left side. 09/18/2020 upon evaluation today patient appears to be doing poorly with regard to his wounds. Again right now the left ankle in particular showed signs of extreme maceration. Apparently he was told by someone with staff at [facility] they could not get the [facility] With that being said this is something that is never been relayed to [place] one way or another. Also the patient subsequently has not supposed to have a border gauze dressing on. He should have an ABD pad and roll gauze to secure as this drains much too much just to have a border gauze dressing to cover. Nonetheless the fact that they are not using the appropriate dressing is directly causing deterioration of the left ankle wound it is significantly worse today compared to what it was previous. I did attempt to call [facility] while the patient was here I called three times and got no one to even pick up the phone. After this I had my for an office coordinator call and she was able to finally get through and leave a message  with the D ON as of dictation of this note which is roughly about an hour and a half later I still have not been able to speak with anyone at the facility. 09/25/2020 upon evaluation today patient actually showing signs of good improvement which is excellent news. He has been tolerating the dressing changes Chad [name], Chad [name] ([id number]) [id number] Page 2 of 13 without complication. Fortunately there is no signs of active infection which is great news. No fevers, chills, nausea, vomiting, or diarrhea. I do feel like the facility has been doing a much better job at taking care of him as far as the dressings are concerned. However the director of nursing never did call me back. 10/09/2020 upon evaluation today patient appears to be doing well with regard to his wound. The toe ulcer did require some debridement but the other 2 areas actually appear to be doing quite well. 10/19/2020 upon evaluation today patient actually appears to be doing very well in regard to his wounds. In fact the heel does appear to be completely healed. The toe is doing better in the medial ankle on the left is also doing better. Overall I think he is headed in the right direction. 10/26/2020 upon evaluation today patient appears to be doing well with regard to his wound. He is showing signs of improvement which is great news and overall I am very pleased with where things stand today. No  fevers, chills, nausea, vomiting, or diarrhea. 11/02/2020 upon evaluation today patient appears to be doing well with regard to his wounds. He has been tolerating the dressing changes without complication overall I am extremely pleased with where things stand today. He in regard to the toe is almost completely healed and the medial ankle on the left is doing much better. 11/09/2020 upon evaluation today patient appears to be doing a little poorly in regard to his left medial ankle ulcer. Fortunately there does not appear to be  any signs of systemic infection but unfortunately locally he does appear to be infected in fact he has blue-green drainage consistent with Pseudomonas. 11/16/2020 upon evaluation today patient appears to be doing well with regard to his wound. It actually appears to be doing better. I did place him on gentamicin cream since the Cipro was actually resistant even though he was positive for Pseudomonas on culture. Overall I think that he does seem to be doing better though I am unsure whether or not they have actually been putting the cream on. The patient is not sure that we did talk to the nurse directly and she was going to initiate that treatment. Fortunately there does not appear to be any signs of active infection at this time. No fevers, chills, nausea, vomiting, or diarrhea. 4/28; the area on the right second toe is close to healed. Left medial ankle required debridement 12/07/2020 upon evaluation today patient appears to be doing well with regard to his wounds. In fact the right second toe appears to be completely healed which is great news. Fortunately there does not appear to be any signs of active infection at this time which is also great news. I think we can probably discontinue the gentamicin on top of everything else. 12/14/2020 upon evaluation today patient appears to be doing well with regard to his wound. He is making good progress and overall very pleased with where things stand today. There is no signs of active infection at this time which is great news. 12/28/2020 upon evaluation today patient appears to be doing well with regard to his wounds. He has been tolerating the dressing changes without complication. Fortunately there is no signs of active infection at this time. No fevers, chills, nausea, vomiting, or diarrhea. 12/28/2020 upon evaluation today patient's wound bed actually showed signs of excellent improvement. He has great epithelization and granulation I do not see any signs  of infection overall I am extremely pleased with where things stand at this point. No fevers, chills, nausea, vomiting, or diarrhea. 01/11/2021 upon evaluation today patient appears to be doing well with regard to his wound on his leg. He has been tolerating the dressing changes without complication. Fortunately there does not appear to be any signs of active infection which is great news. No fevers, chills, nausea, vomiting, or diarrhea. 01/25/2021 upon evaluation today patient appears to be doing well with regard to his wound. He has been tolerating the dressing changes without complication. Fortunately the collagen seems to be doing a great job which is excellent news. No fevers, chills, nausea, vomiting, or diarrhea. 02/08/2021 upon evaluation today patient's wound is actually looking a little bit worse especially in the periwound compared to previous. Fortunately there does not appear to be any signs of infection which is great news with that being said he does have some irritation around the periphery of the wound which has me more concerned. He actually had a dressing on that had not been changed in 3  days. He also is supposed to have daily dressing changes. With regard to the dressing applied he had a silver alginate dressing and silver collagen is what is recommended and ordered. He also had no Desitin around the edges of the wound in the periwound region although that is on the order inspect to be done as well. In general I was very concerned I did contact [facility] actually spoke with Verlon Au who is the scheduling individual and subsequently she stated that she would pass the information to the D ON apparently the D ON was not available to talk to me when I call today. 02/18/2021 upon evaluation today patient's wound is actually showing signs of improvement. Fortunately there does not appear to be any evidence of infection which is great news overall I am extremely pleased with where  things stand today. No fevers, chills, nausea, vomiting, or diarrhea. 8/3; patient presents for 1 week follow-up. He has no issues or complaints today. He denies signs of infection. 03/11/2021 upon evaluation today patient appears to be doing well with regard to his wound. He does have a little bit of slough noted on the surface of the wound but fortunately there does not appear to be any signs of active infection at this time. No fevers, chills, nausea, vomiting, or diarrhea. 03/18/2021 upon evaluation today patient appears to be doing well with regard to his wound. He has been tolerating the dressing changes without complication. There was a little irritation more proximal to where the wound was that was not noted last week but nonetheless this is very superficial just seems to be more irritation we just need to make sure to put a good amount of the zinc over the area in my opinion. Otherwise he does not seem to be doing significantly worse at all which is great news. 03/25/2021 upon evaluation today patient appears to be doing well with regard to his wound. He is going require some sharp debridement today to clear with some of the necrotic debris. I did perform this today without complication postdebridement wound bed appears to be doing much better this is great news. 04/08/2021 upon evaluation today patient appears to be doing decently well in regard to his wound although the overall measurement is not significantly smaller compared to previous. It is gone down a little bit but still the facility continues to not really put the appropriate dressings in place in fact he was supposed to have collagen we think he probably had more of an allergy to At this point. Fortunately there does not appear to be any signs of active infection systemically though locally I do not see anything on initial visualization either as far as erythema or warmth. 04/15/2021 upon evaluation today patient appears to be doing  well with regard to his wound. He is actually showing signs of improvement. I did place him on antibiotics last week, Cipro. He has been taking that 2 times a day and seems to be tolerating it very well. I do not see any evidence of worsening and in fact the overall appearance of the wound is smaller today which is also great news. 9/26; left medial ankle chronic venous insufficiency wound is improved. Using Hydrofera Blue 10/10; left medial ankle chronic venous insufficiency. Wound has not changed much in appearance completely nonviable surface. Apparently there have been problems getting the right product on the wound at the facility although he came in with [facility] on today 05/14/2021 upon evaluation today patient appears to be  doing well with regard to his wound. I think he is making progress here which is good news. Fortunately there does not appear to be any signs of active infection at this time. No fevers, chills, nausea, vomiting, or diarrhea. 05/20/2021 upon evaluation today patient appears to be doing well with regard to his wound. He is showing signs of good improvement which is great news. There does not appear to be any evidence of active infection which is also excellent news. No fevers, chills, nausea, vomiting, or diarrhea. 05/28/2021 upon evaluation today patient appears to be doing quite well. There does not appear to be any signs of active infection at this time which is great news. Overall I am extremely pleased with where things stand today. I think he is headed in the right direction. 06/11/2021 upon evaluation today patient appears to be doing well with regard to his left ankle ulcer and poorly in regard to the toe ulcer on the second toe right Chad [name], Chad [name] ([id number]) [id number] Page 3 of 13 foot. This appears to show signs of joint exposure. Apparently this has been present for 1 to 2 months although he kept forgetting to tell me about  it. That is unfortunate as right now it definitely appears to be doing significantly worse than what I would like to see. There does not appear to be any signs of active infection systemically though locally I am concerned about the possibility of infection the toe is quite red. Again no one from the facility ever contacted [place] to advise that this was going on in the interim either. 06/17/2021 upon evaluation today patient presents for follow-up I did review his x-ray which showed a navicular bone fracture I am unsure of the chronicity of this. Subsequently he also had osteomyelitis of the toe which was what I was more concerned about this did not show up on x-ray but did show up on the pathology scrapings. This was listed as acute osteomyelitis. Nonetheless at this point I think that the antibiotic treatment is the best regimen to go with currently. The patient is in agreement with that plan. Nonetheless he has initially 30 days of doxycycline off likely extend that towards the end of the treatment cycle that will be around the middle of December for an additional 2 weeks. That all depends on how well he continues to heal. Nonetheless based on what I am seeing in the foot I did want a proceed with an MRI as well which I think will be helpful to identify if there is anything else that needs to be addressed from the standpoint of infection. 06/24/2021 upon evaluation today patient appears to be doing pretty well in regards to his wounds. I think both are actually showing signs of improvement which is good I did review his MRI today which did show signs of osteomyelitis of the middle and proximal phalanx on his right foot of the affected toe. With that being said this is actually showing signs of significant improvement today already with the antibiotic therapy I think the redness is also improved. Overall I think that we just need to give this some time with appropriate wound care we will see how things  go potentially hyperbarics could be considered. 07/02/2021 upon inspection today patient actually appears to be doing well in regard to his left ankle which is getting very close to complete resolution of pleased in that regard. Unfortunately he is continuing to have issues with his second toe right foot and this seems  to still be very painful for him. Recommend he try something different from the standpoint of antibiotics. 07/15/2021 upon evaluation today patient appears to be doing actually pretty well in regard to his foot. This is actually showing signs of significant improvement which is great news. Overall I feel like the patient is improving both in regard to the second toe as well as the ankle on the left. With that being said the biggest issue that I do see currently is that he is needing to have a refill of the doxycycline that we previously treated him with. He also did see podiatry they are not going to recommend any amputation at this point since he seems to be doing quite well. For that reason we just need to keep things under control from an infection standpoint. 08/01/2021 upon evaluation today patient appears to be doing well with regard to his wound. He has been tolerating the dressing changes without complication. Fortunately there does not appear to be any evidence of active infection locally nor systemically at this point. In fact I think everything is doing excellent in fact his second toe on the right foot is almost healed and the ankle on the left ankle region is actually very close to being healed as well. 08/08/2021 upon evaluation today patient appears to be doing well with regard to his wound. He has been tolerating the dressing changes without complication. Fortunately I do not see any signs of active infection at this time. Readmission: 12-06-2021 upon evaluation today patient presents for reevaluation here in the clinic he does tell me that he was being seen in facility at  [facility] by a provider that was coming in. He is not sure who this was. He tells me however that the wound seems to have gotten worse even compared to where it was when we last saw him at this point. With that being said I do believe that he is likely going need ongoing wound care here in the clinic and I do believe that we need to be the ones to frontline this since his wound does seem to be getting worse not better at this point. He voiced understanding. He is also in agreement with this plan and feels more comfortable coming here she tells me. Patient's medical history really has not changed since his prior admission he was only gone since January. 12-27-2021 upon evaluation today patient appears to be doing well with regard to his wound they did run out of the [facility] so they did not put anything on just an ABD pad with gentamicin. Still we are seeing some signs of good improvement here with some new epithelization which is great news. 01-10-2022 upon evaluation today patient appears to be doing well with regard to his wounds and he is going require some sharp debridement but overall seems to be making good progress. Fortunately I do not see any evidence of active infection locally or systemically at this time which is great news. 01-24-2022 upon evaluation today patient appears to be doing well with regard to his wound. The facility actually came and dropped him off early and he had another appointment at the hospital and then they just brought him over here and this was still hours before his appointment this afternoon. For that reason we did do our best to work him in this morning and fortunately had some space to make this happen. With that being said patient's wound does seem to be making progress here and I  am very pleased in that regard I do not see any signs of active infection locally or systemically at this time. 02-07-2022 patient appears to be doing well currently in regard  to his wounds. In fact one of them the more proximal is healed the distal is still open but seems to be doing excellent. Fortunately I do not see any evidence of active infection locally or systemically at this time which is great news. No fevers, chills, nausea, vomiting, or diarrhea. 7/27; left medial ankle venous. Improving per our intake nurse. We are using Hydrofera Blue under Tubigrip compression. They are changing that at his facility 03-06-2022 upon evaluation today patient appears to be doing well with regard to the his wound he is can require some sharp debridement but seems to be making excellent progress. Fortunately I do not see any evidence of active infection locally or systemically which is great news. 03-27-2022 upon evaluation today patient's wound is actually showing signs of excellent improvement. Fortunately I see no signs of active infection locally or systemically at this time which is great news. No fevers, chills, nausea, vomiting, or diarrhea. 04-21-2022 upon evaluation today patient appears to be doing excellent in regard to his wound in fact this is very close to resolution based on what I am seeing. I do not see any evidence of active infection locally or systemically at this time which is great news and overall I am extremely pleased with where we are today. 05-05-2022 upon evaluation today patient's wound actually appears to potentially be completely healed. Fortunately I do not see any evidence of active infection at this time which is great news and overall I am very pleased I think this needs a little time to toughen up but other than that I really do believe were doing quite well. He is very pleased to hear this its been a long time coming. 05-19-2022 upon evaluation today patient appears to be doing well currently in regard to his wound. He in fact we were hoping will be completely healed and closed but it still has a very tiny area right in the center which is still  continuing to drain. Fortunately I do not see any signs of infection locally or systemically which is great news. 11/6; this wound is close to completely" he comes in this week with some erythema at 1 edge of this which looks like something was rubbing on the area. Other than that no evidence of infection 06-16-2022 upon evaluation today patient appears to be doing well currently in regard to his ankle ulcer. This is very close to being healed but still has a small area in the central portion which is not. Fortunately there does not appear to be any signs of infection locally or systemically which is great news. No fevers, chills, nausea, vomiting, or diarrhea. 06-30-2022 upon evaluation patient's wound pretty much appears to be almost completely closed. In fact it may even be closed but there are still a lot of skin irritation around the edges of the wound and I just do not feel great about releasing them yet I would like to continue to monitor this just a little bit longer before getting to that point. He is not opposed to this and in fact is happy to continue to come in if need be in order to make sure that things are moving in the right direction he definitely does not want to backtrack. Chad [name], Chad [name] ([id number]) [id number] Page 4 of 13 07-14-2022 upon evaluation today patient  appears to be doing well currently in regard to his wound. Has been tolerating the dressing changes without complication. Fortunately I see no evidence of active infection locally nor systemically which is great news. No fevers, chills, nausea, vomiting, or diarrhea. 08-11-2022 upon evaluation today patient appears to be doing a little worse than last time I saw him although it has been a month. Unfortunately he was not able to be seen due to the fact that he was quarantined in the facility with COVID. Subsequently he tells me that he mention to the facility staff several times about taking it  off over the past several weeks but they continue to tell him that someone have to get in touch with [place] here although nobody ever did until Friday when the nurse manager called to have that documented in the communication notes. With that being said unfortunately this means that he had a wrap on that he was supposed to have on for only 1 week for ended up being on four 1 month essentially. I am glad that there is nothing worse than what we see currently to be perfectly honest. 08-19-2022 upon evaluation today patient appears to be doing well currently in regard to his wounds. He has been tolerating the dressing changes without complication. This is actually smaller today which is great news after last week's reopening. Fortunately I do not see any evidence of infection locally nor systemically at this point. 08-26-2022 upon evaluation today patient appears to be doing well currently in regard to his wound. He has been tolerating the dressing changes without complication. Fortunately there does not appear to be any signs of active infection locally nor systemically which is great news. No fevers, chills, nausea, vomiting, or diarrhea. 09-02-2022 upon evaluation today patient's wound actually showing signs of excellent improvement this is measuring smaller and looking much better. I am very pleased with where we stand I do believe that we are headed in the right direction. 09-09-2022 upon evaluation today patient appears to be doing decently well in regard to his leg in general although there was some swelling this is the 1 thing that I am not too happy with based on what I see currently. Fortunately there does not appear to be any signs of infection locally nor systemically at this point. 09-16-2022 upon evaluation today patient appears to be doing well currently in regard to his wound. He is actually showing signs of improvement he wore the Tubigrip this week and it significantly better compared to last  week's evaluation. Fortunately I do not see any evidence of active infection locally or systemically which is great news. Overall I think that were headed in the right direction which is great news. In fact overall I think this is the best that he has looked in some time. He does not like the Tubigrip but actually did extremely well with this in my opinion. I think he needs to continue to utilize this. 09-23-2022 upon evaluation today patient appears to be doing well currently in regard to his wound. He has been tolerating the dressing changes without complication. Fortunately there does not appear to be any signs of active infection locally nor systemically which is great news and in general I do feel like that we are headed in the right direction. He has been having trouble with the Tubigrip however we have gotten the facility to order compression socks he has not gotten them as of yet. 3/4; patient is about the same with a wound  on the left posterior leg. This is a venous wound. He needs more compression on this leg but he took off the previous wraps we are using. I am not certain whether he is using juxta lite stockings at the nursing home. 3/12; patient presents for follow-up. He unfortunately has 2 new wounds 1 to the dorsal left side and the other to the right heel. He is not sure how these happened. The right heel appears to be caused by pressure. He has been using Hydrofera Blue to the original wound on the left leg. 10-14-2022 upon evaluation today patient appears to be doing well currently in regard to his original wound but unfortunately he has several new wounds which I was completely unaware of before walking into the room to see him today. Unfortunately he seems to not be doing nearly as well as what I saw when I last had an appointment with him 3 weeks ago. Fortunately I do not see any signs of systemic infection though unfortunately locally he has definite infection of the left lower  extremity. He is currently on doxycycline I am going to likely have to extend that today. 10-21-2022 upon evaluation today patient appears to be doing well currently in regard to his wounds. He seems to be tolerating the dressing changes without complication. Fortunately there does not appear to be any signs of active infection locally or systemically which is great news and in general I definitely feel like we are on the right track here. 10-30-2022 upon evaluation today patient still continues to not be doing too well at all with regard to his wounds. Unfortunately he is still having signs of infection as well which also has been concerned. Fortunately I do not see any signs of active infection locally nor systemically at this time. No fevers, chills, nausea, vomiting, or diarrhea. 11-06-2022 upon evaluation today patient appears to be doing okay currently in regard to his wounds. All things considered with his blood flow not being nearly as good as what should I think that he is making pretty good progress here. Fortunately I do not see any signs of infection locally nor systemically which is great news. No fevers, chills, nausea, vomiting, or diarrhea. 11-13-2022 upon evaluation today patient appears to be doing poorly currently in regard to his wounds. Things seem to be getting a little bit worse not better. 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 Subsequently he is also going to need to have vascular intervention but we are unsure exactly when this is going on the undertaking. I would try to actually send a message to Dr. Hazle Quant and the vascular team just to see what exactly the plan is going forward. The patient is in agreement with that plan I just think we need to have a plan especially in light of the fact the left leg seems to be getting a little bit worse at this point. Electronic  Signature(s) Signed: 11/13/2022 10:18:57 AM By: Chad Derry PA-C Entered By: Chad [name] on 11/13/2022 10:18:57 -------------------------------------------------------------------------------- Physical Exam Details Patient Name: Date of Service: Chad [name], Chad [name] 11/13/2022 9:00 A M Medical Record Number: [id number] Patient Account Number: [id number] Date of Birth/Sex: Treating RN: [birth year] (62 y.o. Chad [name] Primary Care Provider: Cyril [name] Other Clinician: Referring Provider: Treating Provider/Extender: Chad [name] Weeks in Treatment: 48 Constitutional Well-nourished and well-hydrated in no acute distress. Respiratory Henslee, Zaquan (  [id number]) [id number] Page 5 of 13 normal breathing without difficulty. Psychiatric this patient is able to make decisions and demonstrates good insight into disease process. Alert and Oriented x 3. pleasant and cooperative. Notes Upon inspection patient's wound bed actually showed signs of good granulation epithelization at this point. Fortunately there does not appear to be any evidence of active infection locally nor systemically which is great news with that being said unfortunately his wounds still are doing much better in regard to the legs at this point. Electronic Signature(s) Signed: 11/13/2022 10:19:30 AM By: Chad Derry PA-C Entered By: Chad [name] on 11/13/2022 10:19:30 -------------------------------------------------------------------------------- Physician Orders Details Patient Name: Date of Service: Chad [name], Chad [name] 11/13/2022 9:00 A M Medical Record Number: [id number] Patient Account Number: [id number] Date of Birth/Sex: Treating RN: [birth year] (62 y.o. 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 Bathing/ Shower/ Hygiene May shower with wound dressing protected with water repellent cover or cast protector. No tub bath. Anesthetic (Use 'Patient Medications' Section for Anesthetic Order Entry) Lidocaine applied to wound bed Edema Control - Lymphedema / Segmental Compressive Device / Other Elevate legs to the level of the heart and pump ankles as often as possible Elevate leg(s) parallel to the floor when sitting. Off-Loading Heel suspension boot - when in bed Medications-Please add to medication list. Other: - apply AandD ointment to leg, do not apply to wound Wound Treatment Wound #12 - Ankle Wound Laterality: Left, Medial, Distal Cleanser: Soap and Water Every Other Day/30 Days Discharge Instructions: Gently cleanse wound with antibacterial soap, rinse and pat dry prior to dressing wounds Cleanser: Wound Cleanser Every Other Day/30 Days Discharge Instructions: Wash your hands with soap and water. Remove old dressing, discard into plastic bag and place into trash. Cleanse the wound with Wound Cleanser prior to applying a clean dressing using gauze sponges, not tissues or cotton balls. Do not scrub or use excessive force. Pat dry using gauze sponges, not tissue or cotton balls. Peri-Wound Care: AandD Ointment Every Other Day/30 Days Discharge Instructions: Apply AandD Ointment to areas of redness Prim Dressing: Silvercel Small 2x2 (in/in) Every Other Day/30 Days ary Discharge Instructions: Apply Silvercel Small 2x2 (in/in)  as instructed Secondary Dressing: ABD Pad 5x9 (in/in) Every Other Day/30 Days Discharge Instructions: Cover with ABD pad [name] ([id number]) [id number] Page 6 of 13 Secured With: [employer] or Non-Sterile 6-ply 4.5x4 (yd/yd) Every Other Day/30 Days Discharge Instructions: Apply Kerlix as directed Wound #15 - Foot Wound Laterality: Dorsal, Left Cleanser: Soap and Water Every Other Day/30 Days Discharge Instructions: Gently cleanse wound with antibacterial soap, rinse and pat dry prior to dressing wounds Cleanser: Wound Cleanser Every Other Day/30 Days Discharge Instructions: Wash your hands with soap and water. Remove old dressing, discard into plastic bag and place into trash. Cleanse the wound with Wound Cleanser prior to applying a clean dressing using gauze sponges, not tissues or cotton balls. Do not scrub or use excessive force. Pat dry using gauze sponges, not tissue or  cotton balls. Peri-Wound Care: AandD Ointment Every Other Day/30 Days Discharge Instructions: Apply AandD Ointment to areas of redness Prim Dressing: Silvercel Small 2x2 (in/in) Every Other Day/30 Days ary Discharge Instructions: Apply Silvercel Small 2x2 (in/in) as instructed Secondary Dressing: ABD Pad 5x9 (in/in) Every Other Day/30 Days Discharge Instructions: Cover with ABD pad Secured With: Kerlix Roll Sterile or Non-Sterile 6-ply 4.5x4 (yd/yd) Every Other Day/30 Days Discharge Instructions: Apply Kerlix as directed Wound #16 - Calcaneus Wound Laterality: Right Cleanser: Soap and Water Every Other Day/30 Days Discharge Instructions: Gently cleanse wound with antibacterial soap, rinse and pat dry prior to dressing wounds Cleanser: Wound Cleanser Every Other Day/30 Days Discharge Instructions: Wash your hands with soap and water. Remove old dressing, discard into plastic bag and place into trash. Cleanse the wound with Wound Cleanser prior to applying a clean dressing  using gauze sponges, not tissues or cotton balls. Do not scrub or use excessive force. Pat dry using gauze sponges, not tissue or cotton balls. Peri-Wound Care: AandD Ointment Every Other Day/30 Days Discharge Instructions: Apply AandD Ointment to areas of redness Prim Dressing: Silvercel Small 2x2 (in/in) Every Other Day/30 Days ary Discharge Instructions: Apply Silvercel Small 2x2 (in/in) as instructed Secondary Dressing: ABD Pad 5x9 (in/in) Every Other Day/30 Days Discharge Instructions: Cover with ABD pad Secured With: Kerlix Roll Sterile or Non-Sterile 6-ply 4.5x4 (yd/yd) Every Other Day/30 Days Discharge Instructions: Apply Kerlix as directed Wound #17 - Lower Leg Wound Laterality: Left, Anterior Cleanser: Soap and Water Every Other Day/30 Days Discharge Instructions: Gently cleanse wound with antibacterial soap, rinse and pat dry prior to dressing wounds Cleanser: Wound Cleanser Every Other Day/30 Days Discharge Instructions: Wash your hands with soap and water. Remove old dressing, discard into plastic bag and place into trash. Cleanse the wound with Wound Cleanser prior to applying a clean dressing using gauze sponges, not tissues or cotton balls. Do not scrub or use excessive force. Pat dry using gauze sponges, not tissue or cotton balls. Peri-Wound Care: AandD Ointment Every Other Day/30 Days Discharge Instructions: Apply AandD Ointment to areas of redness Prim Dressing: Silvercel Small 2x2 (in/in) Every Other Day/30 Days ary Discharge Instructions: Apply Silvercel Small 2x2 (in/in) as instructed Secondary Dressing: ABD Pad 5x9 (in/in) Every Other Day/30 Days Discharge Instructions: Cover with ABD pad Secured With: Kerlix Roll Sterile or Non-Sterile 6-ply 4.5x4 (yd/yd) Every Other Day/30 Days Discharge Instructions: Apply Kerlix as directed Wound #18 - T Second oe Wound Laterality: Right, Medial Cleanser: Soap and Water Every Other Day/30 Days Discharge Instructions: Gently  cleanse wound with antibacterial soap, rinse and pat dry prior to dressing wounds Cleanser: Wound Cleanser Every Other Day/30 Days Discharge Instructions: Wash your hands with soap and water. Remove old dressing, discard into plastic bag and place into trash. Cleanse the wound with Wound Cleanser prior to applying a clean dressing using gauze sponges, not tissues or cotton balls. Do not scrub or use excessive force. Pat dry using gauze sponges, not tissue or cotton balls. Peri-Wound Care: AandD Ointment Every Other Day/30 Days Chad [name], Chad [name] ([id number]) [id number] Page 7 of 13 Discharge Instructions: Apply AandD Ointment to areas of redness Prim Dressing: Silvercel Small 2x2 (in/in) Every Other Day/30 Days ary Discharge Instructions: Apply Silvercel Small 2x2 (in/in) as instructed Secondary Dressing: ABD Pad 5x9 (in/in) Every Other Day/30 Days Discharge Instructions: Cover with ABD pad Secured With: Kerlix Roll Sterile or Non-Sterile 6-ply 4.5x4 (yd/yd) Every Other Day/30 Days Discharge Instructions: Apply Kerlix as directed Wound #19 - Lower Leg Wound  Laterality: Left Cleanser: Soap and Water Every Other Day/30 Days Discharge Instructions: Gently cleanse wound with antibacterial soap, rinse and pat dry prior to dressing wounds Cleanser: Wound Cleanser Every Other Day/30 Days Discharge Instructions: Wash your hands with soap and water. Remove old dressing, discard into plastic bag and place into trash. Cleanse the wound with Wound Cleanser prior to applying a clean dressing using gauze sponges, not tissues or cotton balls. Do not scrub or use excessive force. Pat dry using gauze sponges, not tissue or cotton balls. Peri-Wound Care: AandD Ointment Every Other Day/30 Days Discharge Instructions: Apply AandD Ointment to areas of redness Prim Dressing: Silvercel Small 2x2 (in/in) Every Other Day/30 Days ary Discharge Instructions: Apply Silvercel Small 2x2 (in/in)  as instructed Secondary Dressing: ABD Pad 5x9 (in/in) Every Other Day/30 Days Discharge Instructions: Cover with ABD pad Secured With: Kerlix Roll Sterile or Non-Sterile 6-ply 4.5x4 (yd/yd) Every Other Day/30 Days Discharge Instructions: Apply Kerlix as directed Electronic Signature(s) Signed: 11/14/2022 1:44:50 PM By: Chad Derry PA-C Signed: 11/18/2022 5:03:01 PM By: Elliot Gurney, BSN, RN, CWS, Kim RN, BSN Entered By: Elliot Gurney, BSN, RN, CWS, Kim on 11/13/2022 10:23:57 -------------------------------------------------------------------------------- Problem List Details Patient Name: Date of Service: Chad [name], Chad [name] 11/13/2022 9:00 A M Medical Record Number: [id number] Patient Account Number: [id number] Date of Birth/Sex: Treating RN: 05/31/1961 (62 y.o. 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 affecting left non- 12/06/2021 No Yes dominant side 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, stage 3 10/07/2022 No Yes [name] ([id number]) [id number] Page 8 of 13 Inactive Problems Resolved Problems Electronic Signature(s) Signed: 11/13/2022 9:30:11 AM By: Chad Derry PA-C Entered By: Chad [name] on 11/13/2022 09:30:11 -------------------------------------------------------------------------------- Progress Note Details Patient Name: Date of Service: Chad [name], Chad [name] [name] 11/13/2022 9:00 A M Medical Record  Number: [id number] Patient Account Number: [id number] Date of Birth/Sex: Treating RN: 03/22/1961 (61 y.o. Chad [name] Primary Care Provider: Cyril [name] Other Clinician: Referring Provider: Treating Provider/Extender: Chad [name] Weeks in Treatment: 48 Subjective Chief Complaint Information obtained from Patient Left ankle ulcer, Left dorsal foot wound, right heel History of Present Illness (HPI) 10/08/18 on evaluation today patient actually presents to our office for initial evaluation concerning wounds that he has of the bilateral lower extremities. 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 He also has bilateral venous stasis. He apparently has been homeless currently following discharge from the hospital apparently he has been placed at [facility] which is is a skilled nursing facility locally. Nonetheless fortunately he does not show any signs of infection at this time which is good news. In fact several of the wound actually appears to be showing some signs of improvement already in my pinion. There are a couple areas in the left leg in particular there likely gonna require some sharp debridement to help clear away some necrotic tissue and help with more sufficient healing. No fevers, chills, nausea, or vomiting noted  at this time. 10/15/18 on evaluation today patient actually appears to be doing very well in regard to his bilateral lower extremities. He's been tolerating the dressing changes without complication. Fortunately there does not appear to be any evidence of active infection at this time which is great news. Overall I'm actually very pleased with how this has progressed in just one visits time. Readmission: 08/14/2020 upon evaluation today patient presents for re-evaluation here in our clinic. He is  having issues with his left ankle region as well as his right toe and his right heel. He tells me that the toe and heel actually began as a area that was itching that he was scratching and then subsequently opened up into wounds. These may have been abscess areas I presume based on what I am seeing currently. With regard to his left ankle region he tells me this was a similar type occurrence although he does have venous stasis this very well may be more of a venous leg ulcer more than anything. Nonetheless I do believe that the patient would benefit from appropriate and aggressive wound care to try to help get things under better control here. He does have history of a stroke on the left side affecting him to some degree there that he is able to stand although he does have some residual weakness. Otherwise again the patient does have chronic venous insufficiency as previously noted. His arterial studies most recently obtained showed that he had an ABI on the right of 1.16 with a TBI of 0.52 and on the left and ABI of 1.14 with a TBI of 0.81. That was obtained on 06/19/2020. 08/28/2020 upon evaluation today patient appears to be doing decently well in regard to his wounds in general. He has been tolerating the dressing changes without complication. Fortunately there does not appear to be any signs of active infection which is great news. With that being said I think the [facility] is doing a good job I would recommend that we likely continue with that currently. 09/11/2020 upon evaluation today patient's wounds did not appear to be doing too poorly but again he is not really showing signs of significant improvement with regard to any of the wounds on the right. None of them have Hydrofera Blue on them I am not exactly sure why this is not being followed as the facility did not contact us to let us know of any issues with obtaining dressings or otherwise. With that being said he is supposed to be using  Hydrofera Blue on both of the wounds on the right foot as well as the ankle wound on the left side. 09/18/2020 upon evaluation today patient appears to be doing poorly with regard to his wounds. Again right now the left ankle in particular showed signs of extreme maceration. Apparently he was told by someone with staff at [facility] they could not get the [facility]. With that being said this is something that is never been relayed to [place] one way or another. Also the patient subsequently has not supposed to have a border gauze dressing on. He should have an ABD pad and roll gauze to secure as this drains much too much just to have a border gauze dressing to cover. Nonetheless the fact that they are not using the appropriate dressing is directly causing deterioration of the left ankle wound it is significantly worse today compared to what it was previous. I did attempt to call [facility] while the patient was  here I called three times and got no one to even pick up the phone. After this I had my for an office coordinator call and she was able to finally get through and leave a message with the D ON as of dictation of this note which is roughly about an hour and a half later I still have not been able to speak with anyone at the facility. 09/25/2020 upon evaluation today patient actually showing signs of good improvement which is excellent news. He has been tolerating the dressing changes without complication. Fortunately there is no signs of active infection which is great news. No fevers, chills, nausea, vomiting, or diarrhea. I do feel like the facility has been doing a much better job at taking care of him as far as the dressings are concerned. However the director of nursing never did call me back. 10/09/2020 upon evaluation today patient appears to be doing well with regard to his wound. The toe ulcer did require some debridement but the other 2 areas actually appear to be doing  quite well. 10/19/2020 upon evaluation today patient actually appears to be doing very well in regard to his wounds. In fact the heel does appear to be completely healed. The toe is doing better in the medial ankle on the left is also doing better. Overall I think he is headed in the right direction. 10/26/2020 upon evaluation today patient appears to be doing well with regard to his wound. He is showing signs of improvement which is great news and overall I am very pleased with where things stand today. No fevers, chills, nausea, vomiting, or diarrhea. Chad [name], Chad [name] ([id number]) [id number] Page 9 of 13 11/02/2020 upon evaluation today patient appears to be doing well with regard to his wounds. He has been tolerating the dressing changes without complication overall I am extremely pleased with where things stand today. He in regard to the toe is almost completely healed and the medial ankle on the left is doing much better. 11/09/2020 upon evaluation today patient appears to be doing a little poorly in regard to his left medial ankle ulcer. Fortunately there does not appear to be any signs of systemic infection but unfortunately locally he does appear to be infected in fact he has blue-green drainage consistent with Pseudomonas. 11/16/2020 upon evaluation today patient appears to be doing well with regard to his wound. It actually appears to be doing better. I did place him on gentamicin cream since the Cipro was actually resistant even though he was positive for Pseudomonas on culture. Overall I think that he does seem to be doing better though I am unsure whether or not they have actually been putting the cream on. The patient is not sure that we did talk to the nurse directly and she was going to initiate that treatment. Fortunately there does not appear to be any signs of active infection at this time. No fevers, chills, nausea, vomiting, or diarrhea. 4/28; the area on the  right second toe is close to healed. Left medial ankle required debridement 12/07/2020 upon evaluation today patient appears to be doing well with regard to his wounds. In fact the right second toe appears to be completely healed which is great news. Fortunately there does not appear to be any signs of active infection at this time which is also great news. I think we can probably discontinue the gentamicin on top of everything else. 12/14/2020 upon evaluation today patient appears to be doing well with regard to his  wound. He is making good progress and overall very pleased with where things stand today. There is no signs of active infection at this time which is great news. 12/28/2020 upon evaluation today patient appears to be doing well with regard to his wounds. He has been tolerating the dressing changes without complication. Fortunately there is no signs of active infection at this time. No fevers, chills, nausea, vomiting, or diarrhea. 12/28/2020 upon evaluation today patient's wound bed actually showed signs of excellent improvement. He has great epithelization and granulation I do not see any signs of infection overall I am extremely pleased with where things stand at this point. No fevers, chills, nausea, vomiting, or diarrhea. 01/11/2021 upon evaluation today patient appears to be doing well with regard to his wound on his leg. He has been tolerating the dressing changes without complication. Fortunately there does not appear to be any signs of active infection which is great news. No fevers, chills, nausea, vomiting, or diarrhea. 01/25/2021 upon evaluation today patient appears to be doing well with regard to his wound. He has been tolerating the dressing changes without complication. Fortunately the collagen seems to be doing a great job which is excellent news. No fevers, chills, nausea, vomiting, or diarrhea. 02/08/2021 upon evaluation today patient's wound is actually looking a little bit worse  especially in the periwound compared to previous. Fortunately there does not appear to be any signs of infection which is great news with that being said he does have some irritation around the periphery of the wound which has me more concerned. He actually had a dressing on that had not been changed in 3 days. He also is supposed to have daily dressing changes. With regard to the dressing applied he had a silver alginate dressing and silver collagen is what is recommended and ordered. He also had no Desitin around the edges of the wound in the periwound region although that is on the order inspect to be done as well. In general I was very concerned I did contact [facility] actually spoke with Verlon Au who is the scheduling individual and subsequently she stated that she would pass the information to the D ON apparently the D ON was not available to talk to me when I call today. 02/18/2021 upon evaluation today patient's wound is actually showing signs of improvement. Fortunately there does not appear to be any evidence of infection which is great news overall I am extremely pleased with where things stand today. No fevers, chills, nausea, vomiting, or diarrhea. 8/3; patient presents for 1 week follow-up. He has no issues or complaints today. He denies signs of infection. 03/11/2021 upon evaluation today patient appears to be doing well with regard to his wound. He does have a little bit of slough noted on the surface of the wound but fortunately there does not appear to be any signs of active infection at this time. No fevers, chills, nausea, vomiting, or diarrhea. 03/18/2021 upon evaluation today patient appears to be doing well with regard to his wound. He has been tolerating the dressing changes without complication. There was a little irritation more proximal to where the wound was that was not noted last week but nonetheless this is very superficial just seems to be more irritation we just  need to make sure to put a good amount of the zinc over the area in my opinion. Otherwise he does not seem to be doing significantly worse at all which is great news. 03/25/2021 upon evaluation today patient  appears to be doing well with regard to his wound. He is going require some sharp debridement today to clear with some of the necrotic debris. I did perform this today without complication postdebridement wound bed appears to be doing much better this is great news. 04/08/2021 upon evaluation today patient appears to be doing decently well in regard to his wound although the overall measurement is not significantly smaller compared to previous. It is gone down a little bit but still the facility continues to not really put the appropriate dressings in place in fact he was supposed to have collagen we think he probably had more of an allergy to At this point. Fortunately there does not appear to be any signs of active infection systemically though locally I do not see anything on initial visualization either as far as erythema or warmth. 04/15/2021 upon evaluation today patient appears to be doing well with regard to his wound. He is actually showing signs of improvement. I did place him on antibiotics last week, Cipro. He has been taking that 2 times a day and seems to be tolerating it very well. I do not see any evidence of worsening and in fact the overall appearance of the wound is smaller today which is also great news. 9/26; left medial ankle chronic venous insufficiency wound is improved. Using Hydrofera Blue 10/10; left medial ankle chronic venous insufficiency. Wound has not changed much in appearance completely nonviable surface. Apparently there have been problems getting the right product on the wound at the facility although he came in with [facility] on today 05/14/2021 upon evaluation today patient appears to be doing well with regard to his wound. I think he is making progress here  which is good news. Fortunately there does not appear to be any signs of active infection at this time. No fevers, chills, nausea, vomiting, or diarrhea. 05/20/2021 upon evaluation today patient appears to be doing well with regard to his wound. He is showing signs of good improvement which is great news. There does not appear to be any evidence of active infection which is also excellent news. No fevers, chills, nausea, vomiting, or diarrhea. 05/28/2021 upon evaluation today patient appears to be doing quite well. There does not appear to be any signs of active infection at this time which is great news. Overall I am extremely pleased with where things stand today. I think he is headed in the right direction. 06/11/2021 upon evaluation today patient appears to be doing well with regard to his left ankle ulcer and poorly in regard to the toe ulcer on the second toe right foot. This appears to show signs of joint exposure. Apparently this has been present for 1 to 2 months although he kept forgetting to tell me about it. That is unfortunate as right now it definitely appears to be doing significantly worse than what I would like to see. There does not appear to be any signs of active infection systemically though locally I am concerned about the possibility of infection the toe is quite red. Again no one from the facility ever contacted [place] to advise that this was going on in the interim either. 06/17/2021 upon evaluation today patient presents for follow-up I did review his x-ray which showed a navicular bone fracture I am unsure of the chronicity of this. Subsequently he also had osteomyelitis of the toe which was what I was more concerned about this did not show up on x-ray but did show up on the  pathology scrapings. This was listed as acute osteomyelitis. Nonetheless at this point I think that the antibiotic treatment is the best regimen to go with currently. The patient is in agreement with that  plan. Nonetheless he has initially 30 days of doxycycline off likely extend that towards the end of the treatment cycle that will be around the middle of December for an additional 2 weeks. That all depends on how well he continues to heal. Nonetheless based on what I am seeing in the foot I did want a proceed with an MRI as well which I think will be helpful to identify if there is anything else that needs to be addressed from the [facility] ([id number]) [id number] Page 10 of 13 standpoint of infection. 06/24/2021 upon evaluation today patient appears to be doing pretty well in regards to his wounds. I think both are actually showing signs of improvement which is good I did review his MRI today which did show signs of osteomyelitis of the middle and proximal phalanx on his right foot of the affected toe. With that being said this is actually showing signs of significant improvement today already with the antibiotic therapy I think the redness is also improved. Overall I think that we just need to give this some time with appropriate wound care we will see how things go potentially hyperbarics could be considered. 07/02/2021 upon inspection today patient actually appears to be doing well in regard to his left ankle which is getting very close to complete resolution of pleased in that regard. Unfortunately he is continuing to have issues with his second toe right foot and this seems to still be very painful for him. Recommend he try something different from the standpoint of antibiotics. 07/15/2021 upon evaluation today patient appears to be doing actually pretty well in regard to his foot. This is actually showing signs of significant improvement which is great news. Overall I feel like the patient is improving both in regard to the second toe as well as the ankle on the left. With that being said the biggest issue that I do see currently is that he is needing to have a  refill of the doxycycline that we previously treated him with. He also did see podiatry they are not going to recommend any amputation at this point since he seems to be doing quite well. For that reason we just need to keep things under control from an infection standpoint. 08/01/2021 upon evaluation today patient appears to be doing well with regard to his wound. He has been tolerating the dressing changes without complication. Fortunately there does not appear to be any evidence of active infection locally nor systemically at this point. In fact I think everything is doing excellent in fact his second toe on the right foot is almost healed and the ankle on the left ankle region is actually very close to being healed as well. 08/08/2021 upon evaluation today patient appears to be doing well with regard to his wound. He has been tolerating the dressing changes without complication. Fortunately I do not see any signs of active infection at this time. Readmission: 12-06-2021 upon evaluation today patient presents for reevaluation here in the clinic he does tell me that he was being seen in facility at [facility] by a provider that was coming in. He is not sure who this was. He tells me however that the wound seems to have gotten worse even compared to where it was when we last saw him at  this point. With that being said I do believe that he is likely going need ongoing wound care here in the clinic and I do believe that we need to be the ones to frontline this since his wound does seem to be getting worse not better at this point. He voiced understanding. He is also in agreement with this plan and feels more comfortable coming here she tells me. Patient's medical history really has not changed since his prior admission he was only gone since January. 12-27-2021 upon evaluation today patient appears to be doing well with regard to his wound they did run out of the [facility] so they did not put  anything on just an ABD pad with gentamicin. Still we are seeing some signs of good improvement here with some new epithelization which is great news. 01-10-2022 upon evaluation today patient appears to be doing well with regard to his wounds and he is going require some sharp debridement but overall seems to be making good progress. Fortunately I do not see any evidence of active infection locally or systemically at this time which is great news. 01-24-2022 upon evaluation today patient appears to be doing well with regard to his wound. The facility actually came and dropped him off early and he had another appointment at the hospital and then they just brought him over here and this was still hours before his appointment this afternoon. For that reason we did do our best to work him in this morning and fortunately had some space to make this happen. With that being said patient's wound does seem to be making progress here and I am very pleased in that regard I do not see any signs of active infection locally or systemically at this time. 02-07-2022 patient appears to be doing well currently in regard to his wounds. In fact one of them the more proximal is healed the distal is still open but seems to be doing excellent. Fortunately I do not see any evidence of active infection locally or systemically at this time which is great news. No fevers, chills, nausea, vomiting, or diarrhea. 7/27; left medial ankle venous. Improving per our intake nurse. We are using Hydrofera Blue under Tubigrip compression. They are changing that at his facility 03-06-2022 upon evaluation today patient appears to be doing well with regard to the his wound he is can require some sharp debridement but seems to be making excellent progress. Fortunately I do not see any evidence of active infection locally or systemically which is great news. 03-27-2022 upon evaluation today patient's wound is actually showing signs of excellent  improvement. Fortunately I see no signs of active infection locally or systemically at this time which is great news. No fevers, chills, nausea, vomiting, or diarrhea. 04-21-2022 upon evaluation today patient appears to be doing excellent in regard to his wound in fact this is very close to resolution based on what I am seeing. I do not see any evidence of active infection locally or systemically at this time which is great news and overall I am extremely pleased with where we are today. 05-05-2022 upon evaluation today patient's wound actually appears to potentially be completely healed. Fortunately I do not see any evidence of active infection at this time which is great news and overall I am very pleased I think this needs a little time to toughen up but other than that I really do believe were doing quite well. He is very pleased to hear this its been  a long time coming. 05-19-2022 upon evaluation today patient appears to be doing well currently in regard to his wound. He in fact we were hoping will be completely healed and closed but it still has a very tiny area right in the center which is still continuing to drain. Fortunately I do not see any signs of infection locally or systemically which is great news. 11/6; this wound is close to completely" he comes in this week with some erythema at 1 edge of this which looks like something was rubbing on the area. Other than that no evidence of infection 06-16-2022 upon evaluation today patient appears to be doing well currently in regard to his ankle ulcer. This is very close to being healed but still has a small area in the central portion which is not. Fortunately there does not appear to be any signs of infection locally or systemically which is great news. No fevers, chills, nausea, vomiting, or diarrhea. 06-30-2022 upon evaluation patient's wound pretty much appears to be almost completely closed. In fact it may even be closed but there are still a  lot of skin irritation around the edges of the wound and I just do not feel great about releasing them yet I would like to continue to monitor this just a little bit longer before getting to that point. He is not opposed to this and in fact is happy to continue to come in if need be in order to make sure that things are moving in the right direction he definitely does not want to backtrack. 07-14-2022 upon evaluation today patient appears to be doing well currently in regard to his wound. Has been tolerating the dressing changes without complication. Fortunately I see no evidence of active infection locally nor systemically which is great news. No fevers, chills, nausea, vomiting, or diarrhea. 08-11-2022 upon evaluation today patient appears to be doing a little worse than last time I saw him although it has been a month. Unfortunately he was not able to be seen due to the fact that he was quarantined in the facility with COVID. Subsequently he tells me that he mention to the facility staff several times about taking it off over the past several weeks but they continue to tell him that someone have to get in touch with [place] here although nobody ever did until Friday when the nurse manager called to have that documented in the communication notes. With that being said unfortunately this means that he had a wrap on that he was supposed to have on for only 1 week for ended up being on four 1 month essentially. I am glad that there is nothing worse than what we see currently to be perfectly honest. 08-19-2022 upon evaluation today patient appears to be doing well currently in regard to his wounds. He has been tolerating the dressing changes without Chad [name], Chad [name] ([id number]) [id number] Page 11 of 13 complication. This is actually smaller today which is great news after last week's reopening. Fortunately I do not see any evidence of infection locally nor systemically at this  point. 08-26-2022 upon evaluation today patient appears to be doing well currently in regard to his wound. He has been tolerating the dressing changes without complication. Fortunately there does not appear to be any signs of active infection locally nor systemically which is great news. No fevers, chills, nausea, vomiting, or diarrhea. 09-02-2022 upon evaluation today patient's wound actually showing signs of excellent improvement this is measuring smaller and looking much better. I  am very pleased with where we stand I do believe that we are headed in the right direction. 09-09-2022 upon evaluation today patient appears to be doing decently well in regard to his leg in general although there was some swelling this is the 1 thing that I am not too happy with based on what I see currently. Fortunately there does not appear to be any signs of infection locally nor systemically at this point. 09-16-2022 upon evaluation today patient appears to be doing well currently in regard to his wound. He is actually showing signs of improvement he wore the Tubigrip this week and it significantly better compared to last week's evaluation. Fortunately I do not see any evidence of active infection locally or systemically which is great news. Overall I think that were headed in the right direction which is great news. In fact overall I think this is the best that he has looked in some time. He does not like the Tubigrip but actually did extremely well with this in my opinion. I think he needs to continue to utilize this. 09-23-2022 upon evaluation today patient appears to be doing well currently in regard to his wound. He has been tolerating the dressing changes without complication. Fortunately there does not appear to be any signs of active infection locally nor systemically which is great news and in general I do feel like that we are headed in the right direction. He has been having trouble with the Tubigrip however we  have gotten the facility to order compression socks he has not gotten them as of yet. 3/4; patient is about the same with a wound on the left posterior leg. This is a venous wound. He needs more compression on this leg but he took off the previous wraps we are using. I am not certain whether he is using juxta lite stockings at the nursing home. 3/12; patient presents for follow-up. He unfortunately has 2 new wounds 1 to the dorsal left side and the other to the right heel. He is not sure how these happened. The right heel appears to be caused by pressure. He has been using Hydrofera Blue to the original wound on the left leg. 10-14-2022 upon evaluation today patient appears to be doing well currently in regard to his original wound but unfortunately he has several new wounds which I was completely unaware of before walking into the room to see him today. Unfortunately he seems to not be doing nearly as well as what I saw when I last had an appointment with him 3 weeks ago. Fortunately I do not see any signs of systemic infection though unfortunately locally he has definite infection of the left lower extremity. He is currently on doxycycline I am going to likely have to extend that today. 10-21-2022 upon evaluation today patient appears to be doing well currently in regard to his wounds. He seems to be tolerating the dressing changes without complication. Fortunately there does not appear to be any signs of active infection locally or systemically which is great news and in general I definitely feel like we are on the right track here. 10-30-2022 upon evaluation today patient still continues to not be doing too well at all with regard to his wounds. Unfortunately he is still having signs of infection as well which also has been concerned. Fortunately I do not see any signs of active infection locally nor systemically at this time. No fevers, chills, nausea, vomiting, or diarrhea. 11-06-2022 upon  evaluation today  patient appears to be doing okay currently in regard to his wounds. All things considered with his blood flow not being nearly as good as what should I think that he is making pretty good progress here. Fortunately I do not see any signs of infection locally nor systemically which is great news. No fevers, chills, nausea, vomiting, or diarrhea. 11-13-2022 upon evaluation today patient appears to be doing poorly currently in regard to his wounds. Things seem to be getting a little bit worse not better. 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 Subsequently he is also going to need to have vascular intervention but we are unsure exactly when this is going on the undertaking. I would try to actually send a message to Dr. Hazle Quant and the vascular team just to see what exactly the plan is going forward. The patient is in agreement with that plan I just think we need to have a plan especially in light of the fact the left leg seems to be getting a little bit worse at this point. Objective Constitutional Well-nourished and well-hydrated in no acute distress. Vitals Time Taken: 9:28 AM, Height: 69 in, Weight: 150 lbs, BMI: 22.1, Temperature: 98.2 F, Pulse: 89 bpm, Respiratory Rate: 16 breaths/min, Blood Pressure: 128/75 mmHg. Respiratory normal breathing without difficulty. Psychiatric this patient is able to make decisions and demonstrates good insight into disease process. Alert and Oriented x 3. pleasant and cooperative. General Notes: Upon inspection patient's wound bed actually showed signs of good granulation epithelization at this point. Fortunately there does not appear to be any evidence of active infection locally nor systemically which is great news with that being said unfortunately his wounds still are doing much better in regard to the legs at this point. Integumentary (Hair,  Skin) Wound #12 status is Open. Original cause of wound was Gradually Appeared. The date acquired was: 07/12/2019. The wound has been in treatment 48 weeks. The wound is located on the Left,Distal,Medial Ankle. The wound measures 1.2cm length x 0.9cm width x 0.1cm depth; 0.848cm^2 area and 0.085cm^3 volume. There is Fat Layer (Subcutaneous Tissue) exposed. There is no tunneling or undermining noted. There is a medium amount of serosanguineous drainage noted. The wound margin is distinct with the outline attached to the wound base. There is small (1-33%) pink granulation within the wound bed. There is a large (67- 100%) amount of necrotic tissue within the wound bed including Adherent Slough. Wound #15 status is Open. Original cause of wound was Gradually Appeared. The date acquired was: 10/01/2022. The wound has been in treatment 5 weeks. The JAHIEM, FRANZONI ([id number]) [id number] Page 12 of 13 wound is located on the Left,Dorsal Foot. The wound measures 3.5cm length x 3cm width x 0.2cm depth; 8.247cm^2 area and 1.649cm^3 volume. There is Fat Layer (Subcutaneous Tissue) exposed. There is a medium amount of serosanguineous drainage noted. There is small (1-33%) red granulation within the wound bed. There is a large (67-100%) amount of necrotic tissue within the wound bed including Adherent Slough. Wound #16 status is Open. Original cause of wound was Gradually Appeared. The date acquired was: 09/26/2022. The wound has been in treatment 5 weeks. The wound is located on the Right Calcaneus. The wound measures 0.8cm length x 1.2cm width x 0.1cm depth; 0.754cm^2 area and 0.075cm^3 volume. There is Fat Layer (Subcutaneous Tissue) exposed. There is no tunneling or undermining noted. There is a medium  amount of serosanguineous drainage noted. There is medium (34-66%) red granulation within the wound bed. There is a medium (34-66%) amount of necrotic tissue within the wound bed  including Adherent Slough. Wound #17 status is Open. Original cause of wound was Pressure Injury. The date acquired was: 09/30/2022. The wound has been in treatment 4 weeks. The wound is located on the Left,Anterior Lower Leg. The wound measures 3.5cm length x 2.6cm width x 0.3cm depth; 7.147cm^2 area and 2.144cm^3 volume. There is Fat Layer (Subcutaneous Tissue) exposed. There is no tunneling or undermining noted. There is a medium amount of serosanguineous drainage noted. There is small (1-33%) red granulation within the wound bed. There is a large (67-100%) amount of necrotic tissue within the wound bed including Eschar. Wound #18 status is Open. Original cause of wound was Pressure Injury. The date acquired was: 09/26/2022. The wound has been in treatment 4 weeks. The wound is located on the Right,Medial T Second. The wound measures 0.8cm length x 0.8cm width x 0.1cm depth; 0.503cm^2 area and 0.05cm^3 volume. oe There is a medium amount of serosanguineous drainage noted. Wound #19 status is Open. Original cause of wound was Gradually Appeared. The date acquired was: 11/10/2022. The wound is located on the Left Lower Leg. The wound measures 1cm length x 0.9cm width x 0.1cm depth; 0.707cm^2 area and 0.071cm^3 volume. There is Fat Layer (Subcutaneous Tissue) exposed. There is no tunneling or undermining noted. There is a medium amount of serous drainage noted. The wound margin is flat and intact. There is no granulation within the wound bed. There is a large (67-100%) amount of necrotic tissue within the wound bed including Adherent Slough. 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 I would recommend at this time that we have the patient continue with dressing changes frequently although I am going to go ahead and switch to a silver alginate away from the [facility] which he tells me hurts and and subsequently that [facility] which I feel like is keeping things a little bit too wet and most of the wounds appear to be fairly clean at this point. He is in agreement this plan. 2. I do believe that he needs intervention from a vascular standpoint I understand he has a lot going on I am not sure how quickly that is going to be able to be done following his abdominal surgery for the colostomy. Nonetheless this is something that I think I would like to send a message out to Dr. Hazle Quant and following in order to see what the thoughts are on how quick after the surgery the patient would be able to have the vascular intervention. Again there is a lot going on here with this patient and his medical status at this time which I completely understand makes this very complicated. I will send a message through epic in the messaging system to see what we can figure out here. We will see patient back for reevaluation in 1 week here in the clinic. If anything worsens or changes patient will contact our office for additional recommendations. Electronic Signature(s) Signed: 11/13/2022 10:20:37 AM By: Chad Derry PA-C Entered By: Chad [name] on 11/13/2022 10:20:37 -------------------------------------------------------------------------------- SuperBill Details Patient Name: Date  of Service: Chad [name], SHAMOON 11/13/2022 Medical Record Number: [id number] Patient Account Number: [id number] Date of Birth/Sex: Treating RN: Dec 29, 1960 (62 y.o. Loel Lofty, Selena Batten Primary Care Provider: Cyril [name] Other Clinician: Referring Provider: Treating Provider/Extender: Chad [name] Weeks in Treatment: 48 Diagnosis Coding ICD-10 Codes Code  Description [phone] Chronic venous hypertension (idiopathic) with ulcer and inflammation of left lower extremity 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 [name] ([id number]) [id number] Page 13 of [phone] Hemiplegia and hemiparesis following cerebral infarction affecting left non-dominant side 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, stage 3 Facility Procedures : CPT4 Code: 84696295 Description: 28413 - WOUND CARE VISIT-LEV 5 EST PT Modifier: Quantity: 1 Physician Procedures : CPT4 Code Description Modifier [id number] - WC PHYS LEVEL 4 - EST PT ICD-10 Diagnosis Description 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 (idiopathic) with ulcer and inflammation of left lower extremity L97.322 Non-pressure chronic ulcer of left ankle  with fat layer exposed L98.492 Non-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

## 2022-11-17 ENCOUNTER — Encounter: Payer: Self-pay | Admitting: General Surgery

## 2022-11-17 ENCOUNTER — Inpatient Hospital Stay: Payer: Medicaid Other | Admitting: Anesthesiology

## 2022-11-17 ENCOUNTER — Encounter
Admission: RE | Disposition: A | Discharge status: Home / Self Care | Payer: Self-pay | Source: Home / Self Care | Attending: General Surgery

## 2022-11-17 ENCOUNTER — Inpatient Hospital Stay
Admission: RE | Admit: 2022-11-17 | Discharge: 2022-11-20 | DRG: 331 | Disposition: A | Discharge status: Home / Self Care | Payer: Medicaid Other | Attending: General Surgery | Admitting: General Surgery

## 2022-11-17 ENCOUNTER — Other Ambulatory Visit: Payer: Self-pay

## 2022-11-17 DIAGNOSIS — E785 Hyperlipidemia, unspecified: Secondary | ICD-10-CM | POA: Diagnosis present

## 2022-11-17 DIAGNOSIS — J449 Chronic obstructive pulmonary disease, unspecified: Secondary | ICD-10-CM | POA: Diagnosis present

## 2022-11-17 DIAGNOSIS — Z8042 Family history of malignant neoplasm of prostate: Secondary | ICD-10-CM | POA: Diagnosis not present

## 2022-11-17 DIAGNOSIS — Z7902 Long term (current) use of antithrombotics/antiplatelets: Secondary | ICD-10-CM | POA: Diagnosis not present

## 2022-11-17 DIAGNOSIS — I10 Essential (primary) hypertension: Secondary | ICD-10-CM | POA: Diagnosis present

## 2022-11-17 DIAGNOSIS — Z8551 Personal history of malignant neoplasm of bladder: Secondary | ICD-10-CM | POA: Diagnosis not present

## 2022-11-17 DIAGNOSIS — C187 Malignant neoplasm of sigmoid colon: Principal | ICD-10-CM | POA: Diagnosis present

## 2022-11-17 DIAGNOSIS — Z8051 Family history of malignant neoplasm of kidney: Secondary | ICD-10-CM

## 2022-11-17 DIAGNOSIS — Z7982 Long term (current) use of aspirin: Secondary | ICD-10-CM | POA: Diagnosis not present

## 2022-11-17 DIAGNOSIS — Z806 Family history of leukemia: Secondary | ICD-10-CM

## 2022-11-17 DIAGNOSIS — I739 Peripheral vascular disease, unspecified: Secondary | ICD-10-CM | POA: Diagnosis present

## 2022-11-17 DIAGNOSIS — Z800 Family history of malignant neoplasm of digestive organs: Secondary | ICD-10-CM

## 2022-11-17 DIAGNOSIS — Z9221 Personal history of antineoplastic chemotherapy: Secondary | ICD-10-CM

## 2022-11-17 DIAGNOSIS — Z8673 Personal history of transient ischemic attack (TIA), and cerebral infarction without residual deficits: Secondary | ICD-10-CM

## 2022-11-17 DIAGNOSIS — Z8546 Personal history of malignant neoplasm of prostate: Secondary | ICD-10-CM | POA: Diagnosis not present

## 2022-11-17 DIAGNOSIS — K219 Gastro-esophageal reflux disease without esophagitis: Secondary | ICD-10-CM | POA: Diagnosis present

## 2022-11-17 DIAGNOSIS — Z8554 Personal history of malignant neoplasm of ureter: Secondary | ICD-10-CM | POA: Diagnosis not present

## 2022-11-17 DIAGNOSIS — Z85038 Personal history of other malignant neoplasm of large intestine: Secondary | ICD-10-CM | POA: Diagnosis not present

## 2022-11-17 DIAGNOSIS — B1920 Unspecified viral hepatitis C without hepatic coma: Secondary | ICD-10-CM | POA: Diagnosis present

## 2022-11-17 DIAGNOSIS — Z79899 Other long term (current) drug therapy: Secondary | ICD-10-CM

## 2022-11-17 DIAGNOSIS — F17210 Nicotine dependence, cigarettes, uncomplicated: Secondary | ICD-10-CM | POA: Diagnosis present

## 2022-11-17 HISTORY — PX: ROBOT ASSISTED LAPAROSCOPIC PARTIAL COLECTOMY: SHX6476

## 2022-11-17 LAB — TYPE AND SCREEN

## 2022-11-17 LAB — BPAM RBC: Blood Product Expiration Date: 202405102359

## 2022-11-17 SURGERY — COLECTOMY, SIGMOID, ROBOT-ASSISTED
Anesthesia: General | Site: Abdomen

## 2022-11-17 MED ORDER — ALVIMOPAN 12 MG PO CAPS
ORAL_CAPSULE | ORAL | Status: AC
  Filled 2022-11-17: qty 1

## 2022-11-17 MED ORDER — FENTANYL CITRATE (PF) 100 MCG/2ML IJ SOLN
INTRAMUSCULAR | Status: AC
  Filled 2022-11-17: qty 2

## 2022-11-17 MED ORDER — EPHEDRINE 5 MG/ML INJ
INTRAVENOUS | Status: AC
  Filled 2022-11-17: qty 5

## 2022-11-17 MED ORDER — SENNOSIDES-DOCUSATE SODIUM 8.6-50 MG PO TABS
1.0000 | ORAL_TABLET | Freq: Every day | ORAL | Status: DC
  Administered 2022-11-17 – 2022-11-20 (×4): 1 via ORAL
  Filled 2022-11-17 (×4): qty 1

## 2022-11-17 MED ORDER — SODIUM CHLORIDE 0.9 % IV SOLN: INTRAVENOUS | Status: DC

## 2022-11-17 MED ORDER — DEXAMETHASONE SODIUM PHOSPHATE 10 MG/ML IJ SOLN
INTRAMUSCULAR | Status: AC
  Filled 2022-11-17: qty 1

## 2022-11-17 MED ORDER — OXYCODONE HCL 5 MG PO TABS
10.0000 mg | ORAL_TABLET | Freq: Four times a day (QID) | ORAL | Status: DC
  Administered 2022-11-17 – 2022-11-20 (×12): 10 mg via ORAL
  Filled 2022-11-17 (×12): qty 2

## 2022-11-17 MED ORDER — ONDANSETRON 4 MG PO TBDP: 4.0000 mg | ORAL_TABLET | Freq: Four times a day (QID) | ORAL | Status: DC | PRN

## 2022-11-17 MED ORDER — HEPARIN SODIUM (PORCINE) 5000 UNIT/ML IJ SOLN
INTRAMUSCULAR | Status: AC
  Filled 2022-11-17: qty 1

## 2022-11-17 MED ORDER — DEXMEDETOMIDINE HCL IN NACL 80 MCG/20ML IV SOLN
INTRAVENOUS | Status: DC | PRN
  Administered 2022-11-17 (×2): 8 ug via BUCCAL

## 2022-11-17 MED ORDER — KETAMINE HCL 10 MG/ML IJ SOLN
INTRAMUSCULAR | Status: DC | PRN
  Administered 2022-11-17: 30 mg via INTRAVENOUS

## 2022-11-17 MED ORDER — FENTANYL CITRATE (PF) 100 MCG/2ML IJ SOLN
INTRAMUSCULAR | Status: DC | PRN
  Administered 2022-11-17 (×2): 25 ug via INTRAVENOUS
  Administered 2022-11-17: 50 ug via INTRAVENOUS

## 2022-11-17 MED ORDER — FENTANYL CITRATE (PF) 100 MCG/2ML IJ SOLN
25.0000 ug | INTRAMUSCULAR | Status: DC | PRN
  Administered 2022-11-17 (×4): 25 ug via INTRAVENOUS

## 2022-11-17 MED ORDER — ROCURONIUM BROMIDE 10 MG/ML (PF) SYRINGE
PREFILLED_SYRINGE | INTRAVENOUS | Status: AC
  Filled 2022-11-17: qty 10

## 2022-11-17 MED ORDER — SUGAMMADEX SODIUM 200 MG/2ML IV SOLN
INTRAVENOUS | Status: DC | PRN
  Administered 2022-11-17 (×2): 100 mg via INTRAVENOUS

## 2022-11-17 MED ORDER — BUPIVACAINE HCL (PF) 0.5 % IJ SOLN
INTRAMUSCULAR | Status: AC
  Filled 2022-11-17: qty 30

## 2022-11-17 MED ORDER — TAMSULOSIN HCL 0.4 MG PO CAPS
0.4000 mg | ORAL_CAPSULE | Freq: Every day | ORAL | Status: DC
  Administered 2022-11-17 – 2022-11-19 (×3): 0.4 mg via ORAL
  Filled 2022-11-17 (×3): qty 1

## 2022-11-17 MED ORDER — LIDOCAINE HCL (PF) 2 % IJ SOLN
INTRAMUSCULAR | Status: AC
  Filled 2022-11-17: qty 5

## 2022-11-17 MED ORDER — BUPIVACAINE HCL (PF) 0.5 % IJ SOLN
INTRAMUSCULAR | Status: DC | PRN
  Administered 2022-11-17: 30 mL

## 2022-11-17 MED ORDER — CHLORHEXIDINE GLUCONATE 0.12 % MT SOLN: 15.0000 mL | Freq: Once | OROMUCOSAL | Status: AC

## 2022-11-17 MED ORDER — PANTOPRAZOLE SODIUM 40 MG IV SOLR
40.0000 mg | Freq: Every day | INTRAVENOUS | Status: DC
  Administered 2022-11-17: 40 mg via INTRAVENOUS
  Filled 2022-11-17: qty 10

## 2022-11-17 MED ORDER — ACETAMINOPHEN 10 MG/ML IV SOLN: 1000.0000 mg | Freq: Once | INTRAVENOUS | Status: DC | PRN

## 2022-11-17 MED ORDER — BUPIVACAINE LIPOSOME 1.3 % IJ SUSP: 20.0000 mL | Freq: Once | INTRAMUSCULAR | Status: DC

## 2022-11-17 MED ORDER — GABAPENTIN 300 MG PO CAPS
300.0000 mg | ORAL_CAPSULE | ORAL | Status: AC
  Administered 2022-11-17: 300 mg via ORAL

## 2022-11-17 MED ORDER — ONDANSETRON HCL 4 MG/2ML IJ SOLN: 4.0000 mg | Freq: Once | INTRAMUSCULAR | Status: DC | PRN

## 2022-11-17 MED ORDER — DEXAMETHASONE SODIUM PHOSPHATE 10 MG/ML IJ SOLN
INTRAMUSCULAR | Status: DC | PRN
  Administered 2022-11-17: 8 mg via INTRAVENOUS

## 2022-11-17 MED ORDER — SODIUM CHLORIDE 0.9 % IV SOLN
INTRAVENOUS | Status: AC
  Filled 2022-11-17: qty 2

## 2022-11-17 MED ORDER — PROPOFOL 10 MG/ML IV BOLUS
INTRAVENOUS | Status: DC | PRN
  Administered 2022-11-17: 120 mg via INTRAVENOUS

## 2022-11-17 MED ORDER — ACETAMINOPHEN 500 MG PO TABS
ORAL_TABLET | ORAL | Status: AC
  Filled 2022-11-17: qty 2

## 2022-11-17 MED ORDER — GABAPENTIN 300 MG PO CAPS
ORAL_CAPSULE | ORAL | Status: AC
  Filled 2022-11-17: qty 1

## 2022-11-17 MED ORDER — CYCLOBENZAPRINE HCL 10 MG PO TABS
5.0000 mg | ORAL_TABLET | Freq: Every day | ORAL | Status: DC
  Administered 2022-11-17 – 2022-11-19 (×3): 5 mg via ORAL
  Filled 2022-11-17 (×3): qty 1

## 2022-11-17 MED ORDER — LABETALOL HCL 5 MG/ML IV SOLN
INTRAVENOUS | Status: DC | PRN
  Administered 2022-11-17: 5 mg via INTRAVENOUS

## 2022-11-17 MED ORDER — ACETAMINOPHEN 500 MG PO TABS
1000.0000 mg | ORAL_TABLET | ORAL | Status: AC
  Administered 2022-11-17: 1000 mg via ORAL

## 2022-11-17 MED ORDER — GABAPENTIN 100 MG PO CAPS
100.0000 mg | ORAL_CAPSULE | Freq: Three times a day (TID) | ORAL | Status: DC
  Administered 2022-11-17 – 2022-11-20 (×9): 100 mg via ORAL
  Filled 2022-11-17 (×9): qty 1

## 2022-11-17 MED ORDER — SODIUM CHLORIDE 0.9 % IV SOLN
2.0000 g | INTRAVENOUS | Status: AC
  Administered 2022-11-17: 2 g via INTRAVENOUS

## 2022-11-17 MED ORDER — BUPIVACAINE LIPOSOME 1.3 % IJ SUSP
INTRAMUSCULAR | Status: AC
  Filled 2022-11-17: qty 20

## 2022-11-17 MED ORDER — CELECOXIB 200 MG PO CAPS
200.0000 mg | ORAL_CAPSULE | ORAL | Status: AC
  Administered 2022-11-17: 200 mg via ORAL

## 2022-11-17 MED ORDER — HEPARIN SODIUM (PORCINE) 5000 UNIT/ML IJ SOLN
5000.0000 [IU] | Freq: Once | INTRAMUSCULAR | Status: AC
  Administered 2022-11-17: 5000 [IU] via SUBCUTANEOUS

## 2022-11-17 MED ORDER — ONDANSETRON HCL 4 MG/2ML IJ SOLN
INTRAMUSCULAR | Status: DC | PRN
  Administered 2022-11-17: 4 mg via INTRAVENOUS

## 2022-11-17 MED ORDER — MORPHINE SULFATE (PF) 4 MG/ML IV SOLN
4.0000 mg | INTRAVENOUS | Status: DC | PRN
  Administered 2022-11-17 – 2022-11-20 (×6): 4 mg via INTRAVENOUS
  Filled 2022-11-17 (×7): qty 1

## 2022-11-17 MED ORDER — KETAMINE HCL 50 MG/5ML IJ SOSY
PREFILLED_SYRINGE | INTRAMUSCULAR | Status: AC
  Filled 2022-11-17: qty 5

## 2022-11-17 MED ORDER — ALVIMOPAN 12 MG PO CAPS
12.0000 mg | ORAL_CAPSULE | ORAL | Status: AC
  Administered 2022-11-17: 12 mg via ORAL

## 2022-11-17 MED ORDER — FAMOTIDINE 20 MG PO TABS
20.0000 mg | ORAL_TABLET | Freq: Once | ORAL | Status: AC
  Administered 2022-11-17: 20 mg via ORAL

## 2022-11-17 MED ORDER — BUPIVACAINE LIPOSOME 1.3 % IJ SUSP
INTRAMUSCULAR | Status: DC | PRN
  Administered 2022-11-17: 20 mL

## 2022-11-17 MED ORDER — DEXMEDETOMIDINE HCL IN NACL 80 MCG/20ML IV SOLN
INTRAVENOUS | Status: AC
  Filled 2022-11-17: qty 20

## 2022-11-17 MED ORDER — MIRTAZAPINE 15 MG PO TABS
7.5000 mg | ORAL_TABLET | Freq: Every day | ORAL | Status: DC
  Administered 2022-11-17 – 2022-11-19 (×3): 7.5 mg via ORAL
  Filled 2022-11-17 (×3): qty 1

## 2022-11-17 MED ORDER — OXYCODONE HCL 5 MG/5ML PO SOLN: 5.0000 mg | Freq: Once | ORAL | Status: DC | PRN

## 2022-11-17 MED ORDER — ONDANSETRON HCL 4 MG/2ML IJ SOLN
INTRAMUSCULAR | Status: AC
  Filled 2022-11-17: qty 2

## 2022-11-17 MED ORDER — MIDAZOLAM HCL 2 MG/2ML IJ SOLN
INTRAMUSCULAR | Status: AC
  Filled 2022-11-17: qty 2

## 2022-11-17 MED ORDER — FAMOTIDINE 20 MG PO TABS
ORAL_TABLET | ORAL | Status: AC
  Filled 2022-11-17: qty 1

## 2022-11-17 MED ORDER — PHENYLEPHRINE 80 MCG/ML (10ML) SYRINGE FOR IV PUSH (FOR BLOOD PRESSURE SUPPORT)
PREFILLED_SYRINGE | INTRAVENOUS | Status: AC
  Filled 2022-11-17: qty 10

## 2022-11-17 MED ORDER — CELECOXIB 200 MG PO CAPS
ORAL_CAPSULE | ORAL | Status: AC
  Filled 2022-11-17: qty 1

## 2022-11-17 MED ORDER — ROCURONIUM BROMIDE 100 MG/10ML IV SOLN
INTRAVENOUS | Status: DC | PRN
  Administered 2022-11-17: 40 mg via INTRAVENOUS
  Administered 2022-11-17: 25 mg via INTRAVENOUS

## 2022-11-17 MED ORDER — LACTATED RINGERS IV SOLN: INTRAVENOUS | Status: DC

## 2022-11-17 MED ORDER — ONDANSETRON HCL 4 MG/2ML IJ SOLN: 4.0000 mg | Freq: Four times a day (QID) | INTRAMUSCULAR | Status: DC | PRN

## 2022-11-17 MED ORDER — ENOXAPARIN SODIUM 40 MG/0.4ML IJ SOSY
40.0000 mg | PREFILLED_SYRINGE | INTRAMUSCULAR | Status: DC
  Administered 2022-11-18 – 2022-11-20 (×3): 40 mg via SUBCUTANEOUS
  Filled 2022-11-17 (×3): qty 0.4

## 2022-11-17 MED ORDER — POLYVINYL ALCOHOL 1.4 % OP SOLN
1.0000 [drp] | Freq: Three times a day (TID) | OPHTHALMIC | Status: DC
  Administered 2022-11-17 – 2022-11-20 (×8): 1 [drp] via OPHTHALMIC
  Filled 2022-11-17 (×2): qty 15

## 2022-11-17 MED ORDER — LABETALOL HCL 5 MG/ML IV SOLN
INTRAVENOUS | Status: AC
  Filled 2022-11-17: qty 4

## 2022-11-17 MED ORDER — OXYCODONE HCL 5 MG PO TABS: 5.0000 mg | ORAL_TABLET | Freq: Once | ORAL | Status: DC | PRN

## 2022-11-17 MED ORDER — ESCITALOPRAM OXALATE 10 MG PO TABS
5.0000 mg | ORAL_TABLET | Freq: Every day | ORAL | Status: DC
  Administered 2022-11-17 – 2022-11-20 (×4): 5 mg via ORAL
  Filled 2022-11-17 (×4): qty 1

## 2022-11-17 MED ORDER — CELECOXIB 200 MG PO CAPS
200.0000 mg | ORAL_CAPSULE | Freq: Two times a day (BID) | ORAL | Status: DC
  Administered 2022-11-17 – 2022-11-20 (×6): 200 mg via ORAL
  Filled 2022-11-17 (×6): qty 1

## 2022-11-17 MED ORDER — ORAL CARE MOUTH RINSE
15.0000 mL | Freq: Once | OROMUCOSAL | Status: AC
  Administered 2022-11-17: 15 mL via OROMUCOSAL

## 2022-11-17 MED ORDER — LIDOCAINE HCL (CARDIAC) PF 100 MG/5ML IV SOSY
PREFILLED_SYRINGE | INTRAVENOUS | Status: DC | PRN
  Administered 2022-11-17: 75 mg via INTRAVENOUS

## 2022-11-17 SURGICAL SUPPLY — 97 items
ADH SKN CLS APL DERMABOND .7 (GAUZE/BANDAGES/DRESSINGS)
BASIN KIT SINGLE STR (MISCELLANEOUS) ×1 IMPLANT
BLADE CLIPPER SURG (BLADE) ×1 IMPLANT
BLADE SURG SZ10 CARB STEEL (BLADE) ×1 IMPLANT
BLADE SURG SZ11 CARB STEEL (BLADE) ×1 IMPLANT
CANNULA REDUCER 12-8 DVNC XI (CANNULA) ×1 IMPLANT
CAUTERY HOOK MNPLR 1.6 DVNC XI (INSTRUMENTS) ×1 IMPLANT
CLIP LIGATING HEMO O LOK GREEN (MISCELLANEOUS) ×1 IMPLANT
COVER TIP SHEARS 8 DVNC (MISCELLANEOUS) ×1 IMPLANT
DERMABOND ADVANCED .7 DNX12 (GAUZE/BANDAGES/DRESSINGS) IMPLANT
DRAPE ARM DVNC X/XI (DISPOSABLE) ×4 IMPLANT
DRAPE COLUMN DVNC XI (DISPOSABLE) ×1 IMPLANT
DRAPE LEGGINS SURG 28X43 STRL (DRAPES) ×1 IMPLANT
DRAPE UNDER BUTTOCK W/FLU (DRAPES) ×1 IMPLANT
DRSG OPSITE POSTOP 3X4 (GAUZE/BANDAGES/DRESSINGS) IMPLANT
DRSG OPSITE POSTOP 4X10 (GAUZE/BANDAGES/DRESSINGS) IMPLANT
DRSG OPSITE POSTOP 4X6 (GAUZE/BANDAGES/DRESSINGS) IMPLANT
DRSG OPSITE POSTOP 4X8 (GAUZE/BANDAGES/DRESSINGS) IMPLANT
ELECT BLADE 6.5 EXT (BLADE) IMPLANT
ELECT CAUTERY BLADE 6.4 (BLADE) ×1 IMPLANT
ELECT REM PT RETURN 9FT ADLT (ELECTROSURGICAL) ×1
ELECTRODE REM PT RTRN 9FT ADLT (ELECTROSURGICAL) ×1 IMPLANT
FORCEPS BPLR FENES DVNC XI (FORCEP) ×1 IMPLANT
FORCEPS BPLR R/ABLATION 8 DVNC (INSTRUMENTS) ×1 IMPLANT
GLOVE BIO SURGEON STRL SZ 6.5 (GLOVE) ×3 IMPLANT
GLOVE BIOGEL PI IND STRL 6.5 (GLOVE) ×3 IMPLANT
GOWN STRL REUS W/ TWL LRG LVL3 (GOWN DISPOSABLE) ×6 IMPLANT
GOWN STRL REUS W/TWL LRG LVL3 (GOWN DISPOSABLE) ×6
GRASPER SUT TROCAR 14GX15 (MISCELLANEOUS) IMPLANT
GRASPER TIP-UP FEN DVNC XI (INSTRUMENTS) ×1 IMPLANT
HANDLE YANKAUER SUCT BULB TIP (MISCELLANEOUS) ×1 IMPLANT
IRRIGATION STRYKERFLOW (MISCELLANEOUS) IMPLANT
IRRIGATOR STRYKERFLOW (MISCELLANEOUS)
IRRIGATOR SUCT 8 DISP DVNC XI (IRRIGATION / IRRIGATOR) IMPLANT
IV NS 1000ML (IV SOLUTION)
IV NS 1000ML BAXH (IV SOLUTION) IMPLANT
KIT IMAGING PINPOINTPAQ (MISCELLANEOUS) IMPLANT
KIT OSTOMY 2 PC DRNBL 2.25 STR (WOUND CARE) IMPLANT
KIT OSTOMY DRAINABLE 2.25 STR (WOUND CARE) ×1
KIT PINK PAD W/HEAD ARE REST (MISCELLANEOUS) ×1
KIT PINK PAD W/HEAD ARM REST (MISCELLANEOUS) ×1 IMPLANT
LABEL OR SOLS (LABEL) IMPLANT
MANIFOLD NEPTUNE II (INSTRUMENTS) ×1 IMPLANT
NDL DRIVE SUT CUT DVNC (INSTRUMENTS) ×1 IMPLANT
NDL HYPO 22X1.5 SAFETY MO (MISCELLANEOUS) ×1 IMPLANT
NDL INSUFFLATION 14GA 120MM (NEEDLE) ×1 IMPLANT
NEEDLE DRIVE SUT CUT DVNC (INSTRUMENTS) ×1 IMPLANT
NEEDLE HYPO 22X1.5 SAFETY MO (MISCELLANEOUS) ×1 IMPLANT
NEEDLE INSUFFLATION 14GA 120MM (NEEDLE) ×1 IMPLANT
OBTURATOR OPTICAL STND 8 DVNC (TROCAR) ×1
OBTURATOR OPTICALSTD 8 DVNC (TROCAR) ×1 IMPLANT
PACK COLON CLEAN CLOSURE (MISCELLANEOUS) ×1 IMPLANT
PACK LAP CHOLECYSTECTOMY (MISCELLANEOUS) ×1 IMPLANT
PORT ACCESS TROCAR AIRSEAL 5 (TROCAR) ×1 IMPLANT
RELOAD STAPLE 45 3.5 BLU DVNC (STAPLE) IMPLANT
RELOAD STAPLE 60 3.5 BLU DVNC (STAPLE) IMPLANT
RELOAD STAPLER 3.5X45 BLU DVNC (STAPLE) IMPLANT
RELOAD STAPLER 3.5X60 BLU DVNC (STAPLE) ×2 IMPLANT
RETRACTOR RING XSMALL (MISCELLANEOUS) IMPLANT
RETRACTOR WOUND ALXS 18CM SML (MISCELLANEOUS) IMPLANT
RTRCTR WOUND ALEXIS 13CM XS SH (MISCELLANEOUS)
RTRCTR WOUND ALEXIS O 18CM SML (MISCELLANEOUS) ×1
SCISSORS MNPLR CVD DVNC XI (INSTRUMENTS) ×1 IMPLANT
SEAL UNIV 5-12 XI (MISCELLANEOUS) ×3 IMPLANT
SEALER VESSEL EXT DVNC XI (MISCELLANEOUS) IMPLANT
SET TRI-LUMEN FLTR TB AIRSEAL (TUBING) ×1 IMPLANT
SOL ELECTROSURG ANTI STICK (MISCELLANEOUS) ×1
SOL PREP PVP 2OZ (MISCELLANEOUS)
SOLUTION ELECTROSURG ANTI STCK (MISCELLANEOUS) ×1 IMPLANT
SOLUTION PREP PVP 2OZ (MISCELLANEOUS) ×1 IMPLANT
SPONGE T-LAP 18X18 ~~LOC~~+RFID (SPONGE) ×1 IMPLANT
SPONGE T-LAP 4X18 ~~LOC~~+RFID (SPONGE) ×1 IMPLANT
STAPLER 45 SUREFORM DVNC (STAPLE) IMPLANT
STAPLER 60 SUREFORM DVNC (STAPLE) IMPLANT
STAPLER RELOAD 3.5X45 BLU DVNC (STAPLE)
STAPLER RELOAD 3.5X60 BLU DVNC (STAPLE) ×2
STAPLER SKIN PROX 35W (STAPLE) IMPLANT
SURGILUBE 2OZ TUBE FLIPTOP (MISCELLANEOUS) ×1 IMPLANT
SUT MNCRL 4-0 (SUTURE) ×2
SUT MNCRL 4-0 27XMFL (SUTURE) ×2
SUT PDS AB 0 CT1 27 (SUTURE) IMPLANT
SUT SILK 0 SH 30 (SUTURE) ×2 IMPLANT
SUT SILK 3-0 (SUTURE) IMPLANT
SUT STRATAFIX 0 PDS+ CT-2 23 (SUTURE)
SUT VIC AB 3-0 SH 27 (SUTURE) ×1
SUT VIC AB 3-0 SH 27X BRD (SUTURE) ×4 IMPLANT
SUT VICRYL 0 UR6 27IN ABS (SUTURE) ×2 IMPLANT
SUT VICRYL 3-0 CR8 SH (SUTURE) IMPLANT
SUT VLOC 90 6 CV-15 VIOLET (SUTURE) ×1 IMPLANT
SUTURE MNCRL 4-0 27XMF (SUTURE) ×2 IMPLANT
SUTURE STRATFX 0 PDS+ CT-2 23 (SUTURE) ×1 IMPLANT
SYR 30ML LL (SYRINGE) ×2 IMPLANT
SYS TROCAR 1.5-3 SLV ABD GEL (ENDOMECHANICALS) ×1
SYSTEM TROCR 1.5-3 SLV ABD GEL (ENDOMECHANICALS) ×1 IMPLANT
TRAP FLUID SMOKE EVACUATOR (MISCELLANEOUS) ×1 IMPLANT
TRAY FOLEY MTR SLVR 16FR STAT (SET/KITS/TRAYS/PACK) ×1 IMPLANT
WATER STERILE IRR 500ML POUR (IV SOLUTION) ×1 IMPLANT

## 2022-11-17 NOTE — Interval H&P Note (Signed)
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  The various methods of treatment have been discussed with the patient and family. After consideration of risks, benefits and other options for treatment, the patient has consented to  Procedure(s): XI ROBOT ASSISTED SIGMOID COLECTOMY w/ colostomy (N/A) as a surgical intervention.  The patient's history has been reviewed, patient examined, no change in status, stable for surgery.  I have reviewed the patient's chart and labs.  Questions were answered to the patient's satisfaction.     Carolan Shiver

## 2022-11-17 NOTE — Anesthesia Procedure Notes (Signed)
Procedure Name: Intubation Date/Time: 11/17/2022 8:59 AM  Performed by: Jannet Mantis, CRNAPre-anesthesia Checklist: Patient identified, Patient being monitored, Timeout performed, Emergency Drugs available and Suction available Patient Re-evaluated:Patient Re-evaluated prior to induction Oxygen Delivery Method: Circle system utilized Preoxygenation: Pre-oxygenation with 100% oxygen Induction Type: IV induction Ventilation: Mask ventilation without difficulty Laryngoscope Size: McGraph and 4 Grade View: Grade I Tube type: Oral Tube size: 7.5 mm Number of attempts: 1 Airway Equipment and Method: Stylet Placement Confirmation: ETT inserted through vocal cords under direct vision, positive ETCO2 and breath sounds checked- equal and bilateral Secured at: 21 cm Tube secured with: Tape Dental Injury: Teeth and Oropharynx as per pre-operative assessment

## 2022-11-17 NOTE — Anesthesia Preprocedure Evaluation (Addendum)
Anesthesia Evaluation  Patient identified by MRN, date of birth, ID band Patient awake    Reviewed: Allergy & Precautions, NPO status , Patient's Chart, lab work & pertinent test results  History of Anesthesia Complications Negative for: history of anesthetic complications  Airway Mallampati: IV   Neck ROM: Full    Dental  (+) Edentulous Upper, Edentulous Lower   Pulmonary COPD, Current Smoker (1/2 ppd)Patient did not abstain from smoking.   Pulmonary exam normal breath sounds clear to auscultation       Cardiovascular + Peripheral Vascular Disease (on Plavix)  Normal cardiovascular exam Rhythm:Regular Rate:Normal  ECG 11/07/22:  Normal sinus rhythm Minimal voltage criteria for LVH, may be normal variant ( R in aVL )   Neuro/Psych  PSYCHIATRIC DISORDERS Anxiety Depression    CVA (residual left hand weakness)    GI/Hepatic ,GERD  ,,(+) Hepatitis -, CLynch syndrome   Endo/Other  Hypothyroidism    Renal/GU Renal disease (CKD)   Bladder CA; BPH    Musculoskeletal   Abdominal   Peds  Hematology  (+) Blood dyscrasia, anemia   Anesthesia Other Findings   Reproductive/Obstetrics                             Anesthesia Physical Anesthesia Plan  ASA: 3  Anesthesia Plan: General   Post-op Pain Management:    Induction: Intravenous  PONV Risk Score and Plan: 1 and Ondansetron, Dexamethasone and Treatment may vary due to age or medical condition  Airway Management Planned: Oral ETT  Additional Equipment:   Intra-op Plan:   Post-operative Plan: Extubation in OR  Informed Consent: I have reviewed the patients History and Physical, chart, labs and discussed the procedure including the risks, benefits and alternatives for the proposed anesthesia with the patient or authorized representative who has indicated his/her understanding and acceptance.     Dental advisory given  Plan  Discussed with: CRNA  Anesthesia Plan Comments: (Patient consented for risks of anesthesia including but not limited to:  - adverse reactions to medications - damage to eyes, teeth, lips or other oral mucosa - nerve damage due to positioning  - sore throat or hoarseness - damage to heart, brain, nerves, lungs, other parts of body or loss of life  Informed patient about role of CRNA in peri- and intra-operative care.  Patient voiced understanding.)        Anesthesia Quick Evaluation

## 2022-11-17 NOTE — Op Note (Signed)
Preoperative diagnosis: Sigmoid Cancer  Postoperative diagnosis: Sigmoid Cancer  Procedure: Robotic assisted laparoscopic sigmoid colectomy with end ileostomy creation.   Anesthesia: GETA   Surgeon: Carolan Shiver, MD  Assistant: None    Wound Classification: Clean contaminated   Specimen: Sigmoid colon   Complications: None   Estimated Blood Loss: 10 mL   Indications: Patient is a 62 y.o. male with stage III sigmoid Cancer was found that has been treated with chemotherapy.  Upon follow-up he was found with increasing size pericolonic lymphadenopathy.  Medical oncology requested to partial colectomy.   FIndings: 1.  Mass of the sigmoid colon attached to the left lower quadrant abdominal wall. 2.  Adequate hemostasis.  3.  No gross metastasis noted   Description of procedure: The patient was placed on the operating table in the supine position, both arms tucked. General anesthesia was induced. A time-out was completed verifying correct patient, procedure, site, positioning, and implant(s) and/or special equipment prior to beginning this procedure. The abdomen was prepped and draped in the usual sterile fashion.    A Veress needle was inserted on Palmer's point.  Abdominal cavity was insufflated to 15 mmHg. Patient tolerated insufflation well.  An 8 mm port was inserted in an Optiview fashion in the right upper quadrant.  Two additional 8 mm ports and one 12 mm port were inserted under direct visualization along the right side of the abdominal wall. 5 mm assistant port was then placed on the right subcostal area.  No injuries from trocar placements were noted. The table was placed in the Trendelenburg position.  Xi robotic platform was then brought to the operative field and docked at an angle from the left lower quadrant.  Tip up grasper and fenestrated bipolar were placed in the left arm ports.  Scissors were placed in right arm port.   Examination of the abdominal cavity noted  no signs of gross metastasis.  A large mass of the sigmoid colon attached to the left lateral abdominal wall of the was noted.  Abundant amount of adhesions were identified in the pelvis and left lower quadrant.  Difficult and time-consuming lysis of adhesion was done with scissors.     After lysis of adhesions, the sigmoid colon is elevated.  The peritoneal reflection at the sacral promontory scored.  Dissection is then taken from medial to lateral until the ureter is identified.  Then the dissection then start to move cephalad until the inferior mesenteric artery pedicle is identified.  This is the circumferentially dissected and ligated/divided with vessel sealer.  Then medial to lateral dissection is continue cephalad up to the splenic flexure.  Then dissection is done caudally to the rectum until the rectal arteries were identified and ligated with vessel sealer.  The point of transection of the rectum is identified and dissected circumferentially.  Then the sigmoid colon is retracted medially and the lateral attachment of the descending colon are dissected for mobilization of the descending colon.  Then, 5 mg of ICG green's are flush IV.  This is down for evaluation of adequate perfusion of the colonic graft pedicle.  Adequate ICG perfusion was identified.  The point of transection were identified.  With the stapler, the sigmoid was divided at the sigmoid rectal junction distally and proximal to the diseased sigmoid colon.  Then, I scrubbed back.  A small incision was done in the supraumbilical area.  Dissection was carried down to the linea alba.  Linea alba was divided and abdominal cavity entered.  Sigmoid  colon was removed.   The proximal descending colon reached easily to the proposed colostomy site without tension. A disk of skin was removed from the colostomy site in the left lower quadrant. The incision was deepened through all layers of the abdominal wall and dilated to admit two fingers. The  colon was passed out through the ostomy site without torsion or tension.    Using a clean closure technique, the fascia was closed with a running suture of PDS 0. The skin was closed with skin staples.   The colostomy was matured with multiple interrupted sutures of 3-0 Vicryl. An ostomy bag was applied.    The patient tolerated the procedure well, awakened from anesthesia and was taken to the postanesthesia care unit in satisfactory condition.  Foley still in place.  Sponge count and instrument count correct at the end of the procedure.

## 2022-11-17 NOTE — Transfer of Care (Signed)
Immediate Anesthesia Transfer of Care Note  Patient: Chad [name]  Procedure(s) Performed: XI ROBOT ASSISTED SIGMOID COLECTOMY w/ colostomy (Abdomen)  Patient Location: PACU  Anesthesia Type:General  Level of Consciousness: awake  Airway & Oxygen Therapy: Patient Spontanous Breathing  Post-op Assessment: Report given to RN  Post vital signs: stable  Last Vitals:  Vitals Value Taken Time  BP 139/67 11/17/22 1148  Temp 36 C 11/17/22 1148  Pulse 81 11/17/22 1152  Resp 11 11/17/22 1152  SpO2 92 % 11/17/22 1152  Vitals shown include unvalidated device data.  Last Pain:  Vitals:   11/17/22 0736  TempSrc: Temporal  PainSc: 4      Past Medical History:  Diagnosis Date   Anemia    Anxiety    ARF (acute respiratory failure)    Atherosclerosis of arteries of extremities    Bladder cancer    BPH with obstruction/lower urinary tract symptoms    Colon cancer    COPD (chronic obstructive pulmonary disease)    Depression    Dysphagia    Family history of colon cancer    Family history of kidney cancer    Family history of leukemia    Family history of prostate cancer    GERD (gastroesophageal reflux disease)    Hepatitis    chronic hep c   Hydronephrosis    Hydronephrosis with ureteral stricture    Hyperlipidemia    Knee pain    Left   Malignant neoplasm of colon    Malnutrition    Nerve pain    Peripheral vascular disease    Pressure ulcer    Prostate cancer    Stroke    Ureteral cancer, right    Urinary frequency    Venous hypertension of both lower extremities    Past Surgical History:  Procedure Laterality Date   COLON SURGERY     En bloc extended right hemicolectomy 07/2017   COLONOSCOPY WITH PROPOFOL N/A 11/06/2020   Procedure: COLONOSCOPY WITH PROPOFOL;  Surgeon: Wyline Mood, MD;  Location: [facility] END[facility]COPY;  Service: Gastroenterology;  Laterality: N/A;   COLONOSCOPY WITH PROPOFOL N/A 07/31/2021   Procedure: COLONOSCOPY WITH PROPOFOL;  Surgeon: Earline Mayotte, MD;  Location: [facility] ENDOSCOPY;  Service: Endoscopy;  Laterality: N/A;   CYSTOSCOPY W/ RETROGRADES Right 08/30/2018   Procedure: CYSTOSCOPY WITH RETROGRADE PYELOGRAM;  Surgeon: Vanna Scotland, MD;  Location: [facility] ORS;  Service: Urology;  Laterality: Right;   CYSTOSCOPY WITH STENT PLACEMENT Right 04/25/2018   Procedure: CYSTOSCOPY WITH STENT PLACEMENT;  Surgeon: Vanna Scotland, MD;  Location: [facility] ORS;  Service: Urology;  Laterality: Right;   CYSTOSCOPY WITH STENT PLACEMENT Right 08/30/2018   Procedure: CYSTOSCOPY WITH STENT Exchange;  Surgeon: Vanna Scotland, MD;  Location: [facility] ORS;  Service: Urology;  Laterality: Right;   CYSTOSCOPY WITH STENT PLACEMENT Right 03/07/2019   Procedure: CYSTOSCOPY WITH STENT Exchange;  Surgeon: Vanna Scotland, MD;  Location: [facility] ORS;  Service: Urology;  Laterality: Right;   CYSTOSCOPY WITH STENT PLACEMENT Right 11/21/2019   Procedure: CYSTOSCOPY WITH STENT Exchange;  Surgeon: Vanna Scotland, MD;  Location: [facility] ORS;  Service: Urology;  Laterality: Right;   LOWER EXTREMITY ANGIOGRAPHY Left 05/23/2019   Procedure: LOWER EXTREMITY ANGIOGRAPHY;  Surgeon: Annice Needy, MD;  Location: [facility];  Service: Cardiovascular;  Laterality: Left;   LOWER EXTREMITY ANGIOGRAPHY Right 05/30/2019   Procedure: LOWER EXTREMITY ANGIOGRAPHY;  Surgeon: Annice Needy, MD;  Location: [facility];  Service: Cardiovascular;  Laterality: Right;   LOWER  EXTREMITY ANGIOGRAPHY Right 02/13/2020   Procedure: LOWER EXTREMITY ANGIOGRAPHY;  Surgeon: Annice Needy, MD;  Location: [facility];  Service: Cardiovascular;  Laterality: Right;   LOWER EXTREMITY ANGIOGRAPHY Left 02/20/2020   Procedure: LOWER EXTREMITY ANGIOGRAPHY;  Surgeon: Annice Needy, MD;  Location: [facility];  Service: Cardiovascular;  Laterality: Left;   PORTA CATH INSERTION N/A 02/28/2019   Procedure: PORTA CATH INSERTION;  Surgeon: Annice Needy, MD;  Location: [facility];  Service:  Cardiovascular;  Laterality: N/A;   tumor removed      Scheduled Meds:  bupivacaine liposome  20 mL Infiltration Once   Continuous Infusions:  acetaminophen     lactated ringers     lactated ringers     PRN Meds:.acetaminophen, fentaNYL (SUBLIMAZE) injection, ondansetron (ZOFRAN) IV, oxyCODONE **OR** oxyCODONE  Patients Stated Pain Goal: 0 (11/17/22 0736)  Complications: No notable events documented.

## 2022-11-17 NOTE — Anesthesia Postprocedure Evaluation (Signed)
Anesthesia Post Note  Patient: Chad [name]  Procedure(s) Performed: XI ROBOT ASSISTED SIGMOID COLECTOMY w/ colostomy (Abdomen)  Patient location during evaluation: PACU Anesthesia Type: General Level of consciousness: awake and alert, oriented and patient cooperative Pain management: pain level controlled Vital Signs Assessment: post-procedure vital signs reviewed and stable Respiratory status: spontaneous breathing, nonlabored ventilation and respiratory function stable Cardiovascular status: blood pressure returned to baseline and stable Postop Assessment: adequate PO intake Anesthetic complications: no   No notable events documented.   Last Vitals:  Vitals:   11/17/22 1245 11/17/22 1300  BP: (!) 143/85 (!) 146/88  Pulse: 81 81  Resp: 14 10  Temp:    SpO2: 93% 93%    Last Pain:  Vitals:   11/17/22 1300  TempSrc:   PainSc: Asleep                 Reed Breech

## 2022-11-18 ENCOUNTER — Inpatient Hospital Stay: Payer: Medicaid Other

## 2022-11-18 LAB — BASIC METABOLIC PANEL
Anion gap: 8 (ref 5–15)
BUN: 14 mg/dL (ref 8–23)
CO2: 23 mmol/L (ref 22–32)
Calcium: 8.2 mg/dL — ABNORMAL LOW (ref 8.9–10.3)
Chloride: 106 mmol/L (ref 98–111)
Creatinine, Ser: 0.99 mg/dL (ref 0.61–1.24)
GFR, Estimated: >60 mL/min (ref >60)
Glucose, Bld: 127 mg/dL — ABNORMAL HIGH (ref 70–99)
Potassium: 4.4 mmol/L (ref 3.5–5.1)
Sodium: 137 mmol/L (ref 135–145)

## 2022-11-18 LAB — CBC
HCT: 38.5 % — ABNORMAL LOW (ref 39.0–52.0)
Hemoglobin: 12.6 g/dL — ABNORMAL LOW (ref 13.0–17.0)
MCH: 26.9 pg (ref 26.0–34.0)
MCHC: 32.7 g/dL (ref 30.0–36.0)
MCV: 82.1 fL (ref 80.0–100.0)
Platelets: 185 10*3/uL (ref 150–400)
RBC: 4.69 MIL/uL (ref 4.22–5.81)
RDW: 16.8 % — ABNORMAL HIGH (ref 11.5–15.5)
WBC: 10.7 10*3/uL — ABNORMAL HIGH (ref 4.0–10.5)
nRBC: 0 % (ref 0.0–0.2)

## 2022-11-18 MED ORDER — POLYETHYLENE GLYCOL 3350 17 G PO PACK
17.0000 g | PACK | Freq: Every day | ORAL | Status: DC
  Administered 2022-11-18 – 2022-11-19 (×2): 17 g via ORAL
  Filled 2022-11-18 (×3): qty 1

## 2022-11-18 MED ORDER — PANTOPRAZOLE SODIUM 40 MG PO TBEC
40.0000 mg | DELAYED_RELEASE_TABLET | Freq: Every day | ORAL | Status: DC
  Administered 2022-11-18 – 2022-11-19 (×2): 40 mg via ORAL
  Filled 2022-11-18 (×2): qty 1

## 2022-11-18 NOTE — Progress Notes (Signed)
Patient ID: Chad [name], male   DOB: 09-23-1960, 62 y.o.   MRN: [id number]     SURGICAL PROGRESS NOTE   Hospital Day(s): 1.   Interval History: Patient seen and examined, no acute events or new complaints overnight. Patient reports feeling sore in the lower abdomen.  Denies any nausea or vomiting.  Still not passing any stool to the ostomy.  Vital signs in last 24 hours: [min-max] current  Temp:  [96.8 F (36 C)-98.9 F (37.2 C)] 98.6 F (37 C) (04/23 0318) Pulse Rate:  [81-101] 90 (04/23 0318) Resp:  [10-20] 18 (04/23 0318) BP: (99-152)/(67-92) 123/80 (04/23 0318) SpO2:  [92 %-100 %] 92 % (04/23 0318)     Height:  (170.2 cm) Weight: 72.6 kg BMI (Calculated): 25.06   Physical Exam:  Constitutional: alert, cooperative and no distress  Respiratory: breathing non-labored at rest  Cardiovascular: regular rate and sinus rhythm  Gastrointestinal: soft, non-tender, and non-distended.  Colostomy is pink and patent.  Labs:     Latest Ref Rng & Units 11/18/2022    4:20 AM 11/04/2022   12:21 PM 10/28/2022    7:54 AM  CBC  WBC 4.0 - 10.5 K/uL 10.7  5.1  5.7   Hemoglobin 13.0 - 17.0 g/dL 40.9  81.1  91.4   Hematocrit 39.0 - 52.0 % 38.5  43.4  41.0   Platelets 150 - 400 K/uL 185  146  137       Latest Ref Rng & Units 11/18/2022    4:20 AM 11/04/2022   12:21 PM 10/28/2022    7:54 AM  CMP  Glucose 70 - 99 mg/dL 782  92  91   BUN 8 - 23 mg/dL 14  27  32   Creatinine 0.61 - 1.24 mg/dL 9.56  2.13  0.86   Sodium 135 - 145 mmol/L 137  135  135   Potassium 3.5 - 5.1 mmol/L 4.4  4.1  4.0   Chloride 98 - 111 mmol/L 106  106  107   CO2 22 - 32 mmol/L Calcium 8.9 - 10.3 mg/dL 8.2  8.5  8.5   Total Protein 6.5 - 8.1 g/dL   7.7   Total Bilirubin 0.3 - 1.2 mg/dL   0.4   Alkaline Phos 38 - 126 U/L   110   AST 15 - 41 U/L   35   ALT 0 - 44 U/L   21     Imaging studies: No new pertinent imaging studies   Assessment/Plan:  62 y.o. male with malignant neoplasm unable to see him  with colon 1 Day Post-Op s/p partial colectomy with end colostomy, complicated by pertinent comorbidities including peripheral vascular disease, urothelial cancer.  -Patient seems to be doing well postop today.  Adequate vital signs -No significant GI function through the colostomy yet. -Will continue with pain management -Continue full liquid diet -Continue DVT prophylaxis  Gae Gallop, MD

## 2022-11-18 NOTE — Consult Note (Addendum)
WOC Nurse Wound consult Note: Reason for Consult: Consult requested for BLE. Pt has been followed by the outpatient wound care center prior to admission and they have been using Aquacel and foam dressings and changing 3 times a week.  I will continue the present plan of care.  Left anterior calf 3X2X.2cm, 50% red and moist, 50% yellow Left anterior foot 1X.3X.2cm, red and moist Left posterior heel .5X.5X.1cm and .3X.3X.1cm, red and moist Left 2nd toe .3X.3cm, dry eschar, no odor, drainage, or fluctuance.  Dressing procedure/placement/frequency: Topical treatment orders provided for bedside nurses to perform as follows: 1. Change dressings to left leg wounds (2) and right heel (2) Q M/W/F as follows: cut piece of Aquacel [employer] [phone]) and apply to wounds, then cover with foam dressings.  Change foam dressings Q 3 days or PRN soiling.  2. Apply foam dressing to right 2nd toes and change Q 3 days or PRN soiling.  WOC Nurse ostomy consult note Pt had colostomy surgery performed yesterday.  Pt states he lives in a SNF prior to admission and plans to return there after discharge, where they will provide total assistance with pouching activities.  Stoma type/location: Stoma is red and viable when visualized through the pouch, which is intact with good seal. Scant amt liquid brown stool.  Educational materials left at bedside, along with 3 sets of supplies. Use Supplies: barrier ring, [employer] # [id number], wafer [employer] [place] # [id number], pouch Lawson # [phone] Enrolled patient in [employer] DC program: NOT YET WOC team will perform first post-op pouch change tomorrow. Mardee Postin MSN, RN, CWOCN, [place], CNS [phone]   .

## 2022-11-18 NOTE — Progress Notes (Signed)
Patient refused to be repositioned in bed q2h. Patient would not allow staff to assess back, or sacral area. Educated patient about pressure injury prevention. Patient continue to refuse to cooperate.

## 2022-11-18 NOTE — Progress Notes (Signed)
PHARMACIST - PHYSICIAN COMMUNICATION  CONCERNING: IV to Oral Route Change Policy  RECOMMENDATION: This patient is receiving pantoprazole by the intravenous route.  Based on criteria approved by the Pharmacy and Therapeutics Committee, the intravenous medication(s) is/are being converted to the equivalent oral dose form(s).  DESCRIPTION: These criteria include: The patient is eating (either orally or via tube) and/or has been taking other orally administered medications for a least 24 hours The patient has no evidence of active gastrointestinal bleeding or impaired GI absorption (gastrectomy, short bowel, patient on TNA or NPO).  If you have questions about this conversion, please contact the Pharmacy Department   Tressie Ellis, [facility] 11/18/2022 3:41 PM

## 2022-11-19 LAB — SURGICAL PATHOLOGY

## 2022-11-19 LAB — BASIC METABOLIC PANEL
Anion gap: 3 — ABNORMAL LOW (ref 5–15)
BUN: 14 mg/dL (ref 8–23)
CO2: 23 mmol/L (ref 22–32)
Calcium: 7.7 mg/dL — ABNORMAL LOW (ref 8.9–10.3)
Chloride: 109 mmol/L (ref 98–111)
Creatinine, Ser: 0.86 mg/dL (ref 0.61–1.24)
GFR, Estimated: >60 mL/min (ref >60)
Glucose, Bld: 85 mg/dL (ref 70–99)
Potassium: 4 mmol/L (ref 3.5–5.1)
Sodium: 135 mmol/L (ref 135–145)

## 2022-11-19 LAB — CBC
HCT: 36 % — ABNORMAL LOW (ref 39.0–52.0)
Hemoglobin: 11.4 g/dL — ABNORMAL LOW (ref 13.0–17.0)
MCH: 26.5 pg (ref 26.0–34.0)
MCHC: 31.7 g/dL (ref 30.0–36.0)
MCV: 83.7 fL (ref 80.0–100.0)
Platelets: 145 10*3/uL — ABNORMAL LOW (ref 150–400)
RBC: 4.3 MIL/uL (ref 4.22–5.81)
RDW: 17.5 % — ABNORMAL HIGH (ref 11.5–15.5)
WBC: 6.2 10*3/uL (ref 4.0–10.5)
nRBC: 0 % (ref 0.0–0.2)

## 2022-11-19 NOTE — TOC Initial Note (Signed)
Transition of Care [facility]) - Initial/Assessment Note    Patient Details  Name: Chad [name] MRN: [id number] Date of Birth: June 16, 1961  Transition [facility]) CM/SW Contact:    Chapman Fitch, RN Phone Number: 11/19/2022, 3:59 PM  Clinical Narrative:                 Patient from [facility] Anticipated dc tomorrow. Patient confirms plan to return at discharge.  Kenney Houseman at [facility]   Patient states his [facility] is in the room. Per Kenney Houseman at [facility] patient will have to return by non emergent EMS transport, and [facility] will have to pick up patients WC  Fl2 sent fr signature         Patient Goals and CMS Choice            Expected Discharge Plan and Services                                              Prior Living Arrangements/Services                       Activities of Daily Living Home Assistive Devices/Equipment: Wheelchair ADL Screening (condition at time of admission) Patient's cognitive ability adequate to safely complete daily activities?: Yes Is the patient deaf or have difficulty hearing?: No Does the patient have difficulty seeing, even when wearing glasses/contacts?: No Does the patient have difficulty concentrating, remembering, or making decisions?: No Patient able to express need for assistance with ADLs?: Yes Does the patient have difficulty dressing or bathing?: Yes Independently performs ADLs?: No Communication: Independent Dressing (OT): Independent Grooming: Independent Feeding: Independent Bathing: Independent Toileting: Needs assistance Is this a change from baseline?: Pre-admission baseline In/Out Bed: Needs assistance Is this a change from baseline?: Pre-admission baseline Walks in Home: Needs assistance Is this a change from baseline?: Pre-admission baseline Does the patient have difficulty walking or climbing stairs?: Yes Weakness of Legs:  Both Weakness of Arms/Hands: Both  Permission Sought/Granted                  Emotional Assessment              Admission diagnosis:  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] Patient Active Problem List   Diagnosis Date Noted   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   Abnormal CT scan 10/23/2020   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/15/2019   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   Genetic testing 06/15/2019   Swelling of limb 04/15/2019   History of colon cancer 03/24/2019   Family history of prostate cancer    Family history of colon cancer    Family history of leukemia    Family history of kidney cancer    Goals of care, counseling/discussion 12/25/2018   Chronic pain syndrome 11/18/2018   Peripheral vascular disease of lower extremity with ulceration 10/04/2018   Neoplasm related pain 09/08/2018   Poor appetite 09/08/2018   Weakness generalized 07/02/2018   Anorexia 07/02/2018   Chronic pain due to neoplasm 07/02/2018   Iron deficiency anemia due to chronic blood loss 05/27/2018   Ureteral cancer, 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 [place] [address] Phone: [phone] Fax: [phone]  [id number] [place], [place] - [address] AT [facility] OF [address] [facility] [address] [place] [address] Phone: [phone] Fax: [phone]  [facility]  - [place], [place] - [address] [place] [address] Phone: [phone] Fax: [phone]     Social Determinants of Health (SDOH) Social History: SDOH Screenings   Food Insecurity: No Food Insecurity (11/17/2022)  Housing: Low Risk   (11/17/2022)  Transportation Needs: No Transportation Needs (11/17/2022)  Utilities: Not At Risk (11/17/2022)  Tobacco Use: High Risk (11/17/2022)   SDOH Interventions:     Readmission Risk Interventions     No data to display

## 2022-11-19 NOTE — Progress Notes (Signed)
SY, SAINTJEAN ([id number]) [id number] Page 1 of 14 Visit Report for 11/13/2022 Arrival Information Details Patient Name: Date of Service: Chad [name], Chad [name] 11/13/2022 9:00 A M Medical Record Number: [id number] Patient Account Number: [id number] Date of Birth/Sex: Treating RN: [birth year] (62 y.o. Arthur Holms Primary Care Lavergne Hiltunen: [name] Other Clinician: Referring Javonne Dorko: Treating Kort Stettler/Extender: [name] in Treatment: 48 Visit Information History Since Last Visit Added or deleted any medications: No Patient Arrived: Wheel Chair Has Dressing in Place as Prescribed: Yes Arrival Time: 09:22 Pain Present Now: No Transfer Assistance: None Patient Identification Verified: Yes Secondary Verification Process Completed: Yes Patient Requires Transmission-Based No Precautions: Patient Has Alerts: Yes Patient Alerts: Patient on Blood Thinner NOT diabetic aspirin 81mg  Lives [facility] ABI R 0.81 11/05/22 ABI L 0.59 11/05/22 Electronic Signature(s) Signed: 11/18/2022 5:03:01 PM By: Elliot Gurney, BSN, RN, CWS, Kim RN, BSN Entered By: Elliot Gurney, BSN, RN, CWS, Kim on 11/13/2022 09:28:55 -------------------------------------------------------------------------------- Clinic Level of Care Assessment Details Patient Name: Date of Service: Chad [name], Chad [name] 11/13/2022 9:00 A M Medical Record Number: [id number] Patient Account Number: [id number] Date of Birth/Sex: Treating RN: [birth year] (62 y.o. Loel Lofty, Selena Batten Primary Care Aeriana Speece: [name] Other Clinician: Referring Jeronda Don: Treating Kailia Starry/Extender: [name] in Treatment: 48 Clinic Level of Care Assessment Items TOOL 4 Quantity Score []  - 0 Use when only an EandM is performed on FOLLOW-UP visit ASSESSMENTS - Nursing Assessment / Reassessment X- 1 10 Reassessment of Co-morbidities (includes updates in patient status) X- 1 5 Reassessment of Adherence  to Treatment Plan ASSESSMENTS - Wound and Skin A ssessment / Reassessment []  - 0 Simple Wound Assessment / Reassessment - one wound X- 6 5 Complex Wound Assessment / Reassessment - multiple wounds []  - 0 Dermatologic / Skin Assessment (not related to wound area) ASSESSMENTS - Focused Assessment []  - 0 Circumferential Edema Measurements - multi extremities []  - 0 Nutritional Assessment / Counseling / Intervention []  - 0 Lower Extremity Assessment (monofilament, tuning fork, pulses) []  - 0 Peripheral Arterial Disease Assessment (using hand held doppler) ASSESSMENTS - Ostomy and/or Continence Assessment and Care []  - 0 Incontinence Assessment and Management []  - 0 Ostomy Care Assessment and Management (repouching, etc.) [name] ([id number]) [id number] Page 2 of 14 PROCESS - Coordination of Care X - Simple Patient / Family Education for ongoing care 1 15 []  - 0 Complex (extensive) Patient / Family Education for ongoing care X- 1 10 Staff obtains Chiropractor, Records, T Results / Process Orders est []  - 0 Staff telephones HHA, Nursing Homes / Clarify orders / etc []  - 0 Routine Transfer to another Facility (non-emergent condition) []  - 0 Routine Hospital Admission (non-emergent condition) []  - 0 New Admissions / Manufacturing engineer / Ordering NPWT Apligraf, etc. , []  - 0 Emergency Hospital Admission (emergent condition) X- 1 10 Simple Discharge Coordination []  - 0 Complex (extensive) Discharge Coordination PROCESS - Special Needs []  - 0 Pediatric / Minor Patient Management []  - 0 Isolation Patient Management []  - 0 Hearing / Language / Visual special needs []  - 0 Assessment of Community assistance (transportation, D/C planning, etc.) []  - 0 Additional assistance / Altered mentation []  - 0 Support Surface(s) Assessment (bed, cushion, seat, etc.) INTERVENTIONS - Wound Cleansing / Measurement []  - 0 Simple Wound Cleansing - one  wound X- 6 5 Complex Wound Cleansing - multiple wounds X- 1 5 Wound Imaging (photographs - any number of wounds) []  - 0 Wound Tracing (instead of photographs) []  - 0  Simple Wound Measurement - one wound X- 6 5 Complex Wound Measurement - multiple wounds INTERVENTIONS - Wound Dressings  - 0 Small Wound Dressing one or multiple wounds X- 2 15 Medium Wound Dressing one or multiple wounds X- 2 20 Large Wound Dressing one or multiple wounds  - 0 Application of Medications - topical  - 0 Application of Medications - injection INTERVENTIONS - Miscellaneous  - 0 External ear exam  - 0 Specimen Collection (cultures, biopsies, blood, body fluids, etc.)  - 0 Specimen(s) / Culture(s) sent or taken to Lab for analysis  - 0 Patient Transfer (multiple staff / Nurse, adult / Similar devices)  - 0 Simple Staple / Suture removal (25 or less)  - 0 Complex Staple / Suture removal (26 or more)  - 0 Hypo / Hyperglycemic Management (close monitor of Blood Glucose)  - 0 Ankle / Brachial Index (ABI) - do not check if billed separately X- 1 5 Vital Signs Has the patient been seen at the hospital within the last three years: Yes Total Score: 220 Level Of Care: New/Established - Level 5 Electronic Signature(s) Signed: 11/18/2022 5:03:01 PM By: Elliot Gurney, BSN, RN, CWS, Kim RN, BSN [place], [place] [phone] By: Elliot Gurney, BSN, RN, CWS, Kim RN, BSN [phone].pdf Page 3 of 14 Signed: 11/18/2022 5:03:01 Entered By: Elliot Gurney, BSN, RN, CWS, Kim on 11/13/2022 10:31:02 -------------------------------------------------------------------------------- Encounter Discharge Information Details Patient Name: Date of Service: Chad [name], Chad [name] 11/13/2022 9:00 A M Medical Record Number: [id number] Patient Account Number: [id number] Date of Birth/Sex: Treating RN: 09/03/1960 (62 y.o. Arthur Holms Primary Care Camillo Quadros: [name] Other Clinician: Referring  Katja Blue: Treating Geovonni Meyerhoff/Extender: [name] in Treatment: 48 Encounter Discharge Information Items Discharge Condition: Stable Ambulatory Status: Wheelchair Discharge Destination: Skilled Nursing Facility Orders Sent: Yes Transportation: Private Auto Schedule Follow-up Appointment: Yes Clinical Summary of Care: Electronic Signature(s) Signed: 11/18/2022 5:03:01 PM By: Elliot Gurney, BSN, RN, CWS, Kim RN, BSN Entered By: Elliot Gurney, BSN, RN, CWS, Kim on 11/13/2022 10:32:39 -------------------------------------------------------------------------------- Lower Extremity Assessment Details Patient Name: Date of Service: Chad [name], Chad [name] 11/13/2022 9:00 A M Medical Record Number: [id number] Patient Account Number: [id number] Date of Birth/Sex: Treating RN: 1960/08/24 (62 y.o. Arthur Holms Primary Care Rasheedah Reis: [name] Other Clinician: Referring Tayanna Talford: Treating Dornell Grasmick/Extender: [name] Weeks in Treatment: 48 Edema Assessment Assessed: Kyra Searles: No] Franne Forts: No] [Left: Edema] [Right: :] Calf Left: Right: Point of Measurement: 33 cm From Medial Instep 37.8 cm 37 cm Ankle Left: Right: Point of Measurement: 12 cm From Medial Instep 26.5 cm 22.5 cm Vascular Assessment Pulses: Dorsalis Pedis Palpable: [Left:No Yes] [Right:No Yes] Electronic Signature(s) Signed: 11/18/2022 5:03:01 PM By: Elliot Gurney, BSN, RN, CWS, Kim RN, BSN Entered By: Elliot Gurney, BSN, RN, CWS, Kim on 11/13/2022 09:56:09 -------------------------------------------------------------------------------- Multi Wound Chart Details Patient Name: Date of Service: Chad [name] 11/13/2022 9:00 A M Medical Record Number: [id number] Patient Account Number: [id number] Date of Birth/Sex: Treating RN: April 06, 1961 (62 y.o. Arthur Holms [place], [name] ([id number]) [id number] Page 4 of 14 Primary Care Evelynne Spiers: [name] Other Clinician: Referring Heather Mckendree: Treating  Goran Olden/Extender: [name] in Treatment: 48 Vital Signs Height(in): 69 Pulse(bpm): 89 Weight(lbs): 150 Blood Pressure(mmHg): 128/75 Body Mass Index(BMI): 22.1 Temperature(F): 98.2 Respiratory Rate(breaths/min): 16 [12:Photos:] [16:No Photos] Left, Distal, Medial Ankle Left, Dorsal Foot Right Calcaneus Wound Location: Gradually Appeared Gradually Appeared Gradually Appeared Wounding Event: Venous Leg Ulcer Venous Leg Ulcer Pressure Ulcer Primary Etiology: Anemia, Chronic Obstructive Anemia, Chronic Obstructive Anemia, Chronic Obstructive Comorbid History: Pulmonary Disease (  COPD), Coronary Pulmonary Disease (COPD), Coronary Pulmonary Disease (COPD), Coronary Artery Disease, Peripheral Arterial Artery Disease, Peripheral Arterial Artery Disease, Peripheral Arterial Disease, Peripheral Venous Disease, Disease, Peripheral Venous Disease, Disease, Peripheral Venous Disease, Hepatitis C, Osteoarthritis, Hepatitis C, Osteoarthritis, Hepatitis C, Osteoarthritis, Neuropathy, Received Chemotherapy Neuropathy, Received Chemotherapy Neuropathy, Received Chemotherapy 07/12/2019 10/01/2022 09/26/2022 Date Acquired: 48 5 5 Weeks of Treatment: Open Open Open Wound Status: No No No Wound Recurrence: No Yes No Clustered Wound: N/A 2 N/A Clustered Quantity: 1.2x0.9x0.1 3.5x3x0.2 0.8x1.2x0.1 Measurements L x W x D (cm) 0.848 8.247 0.754 A (cm) : rea 0.085 1.649 0.075 Volume (cm) : 87.70% -250.00% 20.00% % Reduction in Area: 93.80% -598.70% 60.10% % Reduction in Volume: Full Thickness Without Exposed Full Thickness Without Exposed Category/Stage III Classification: Support Structures Support Structures Medium Medium Medium Exudate A mount: Serosanguineous Serosanguineous Serosanguineous Exudate Type: red, brown red, brown red, brown Exudate Color: Distinct, outline attached N/A N/A Wound Margin: Small (1-33%) Small (1-33%) Medium (34-66%) Granulation  Amount: Pink Red Red Granulation Quality: Large (67-100%) Large (67-100%) Medium (34-66%) Necrotic Amount: Adherent Slough Adherent [facility] Adherent Slough Necrotic Tissue: Fat Layer (Subcutaneous Tissue): Yes Fat Layer (Subcutaneous Tissue): Yes Fat Layer (Subcutaneous Tissue): Yes Exposed Structures: Fascia: No Fascia: No Fascia: No Tendon: No Tendon: No Tendon: No Muscle: No Muscle: No Muscle: No Joint: No Joint: No Joint: No Bone: No Bone: No Bone: No None None None Epithelialization: Wound Number: [id number]  Photos: No Photos No Photos Left, Anterior Lower Leg Right, Medial T Second oe Left Lower Leg Wound Location: Pressure Injury Pressure Injury Gradually Appeared Wounding Event: Pressure Ulcer Pressure Ulcer Venous Leg Ulcer Primary Etiology: Anemia, Chronic Obstructive N/A Anemia, Chronic Obstructive Comorbid History: Pulmonary Disease (COPD), Coronary Pulmonary Disease (COPD), Coronary Artery Disease, Peripheral Arterial Artery Disease, Peripheral Arterial Disease, Peripheral Venous Disease, Disease, Peripheral Venous Disease, Hepatitis C, Osteoarthritis, Hepatitis C, Osteoarthritis, Neuropathy, Received Chemotherapy Neuropathy, Received Chemotherapy 09/30/2022 09/26/2022 11/10/2022 Date Acquired: 4 4 0 Weeks of TreatmentKHOA, OPDAHL ([id number]) [id number] Page 5 of 14 Open Open Open Wound Status: No No No Wound Recurrence: No No No Clustered Wound: N/A N/A N/A Clustered Quantity: 3.5x2.6x0.3 0.8x0.8x0.1 1x0.9x0.1 Measurements L x W x D (cm) 7.147 0.503 0.707 A (cm) : rea 2.144 0.05 0.071 Volume (cm) : -73.30% 8.50% N/A % Reduction in Area: -420.40% 9.10% N/A % Reduction in Volume: Category/Stage II Category/Stage II Full Thickness Without Exposed Classification: Support Structures Medium Medium Medium Exudate A mount: Serosanguineous Serosanguineous Serous Exudate Type: red, brown red, brown amber Exudate  Color: N/A N/A Flat and Intact Wound Margin: Small (1-33%) N/A None Present (0%) Granulation Amount: Red N/A N/A Granulation Quality: Large (67-100%) N/A Large (67-100%) Necrotic Amount: Eschar N/A Adherent Slough Necrotic Tissue: Fat Layer (Subcutaneous Tissue): Yes N/A Fat Layer (Subcutaneous Tissue): Yes Exposed Structures: Fascia: No Fascia: No Tendon: No Tendon: No Muscle: No Muscle: No Joint: No Joint: No Bone: No Bone: No None N/A None Epithelialization: Treatment Notes Electronic Signature(s) Signed: 11/18/2022 5:03:01 PM By: Elliot Gurney, BSN, RN, CWS, Kim RN, BSN Entered By: Elliot Gurney, BSN, RN, CWS, Kim on 11/13/2022 10:09:36 -------------------------------------------------------------------------------- Pain Assessment Details Patient Name: Date of Service: Chad [name], Chad [name] 11/13/2022 9:00 A M Medical Record Number: [id number] Patient Account Number: [id number] Date of Birth/Sex: Treating RN: 08-Mar-1961 (62 y.o. Arthur Holms Primary Care Kylen Ismael: [name] Other Clinician: Referring Brandi Armato: Treating Petina Muraski/Extender: [name] Weeks in Treatment: 48 Active Problems Location of Pain Severity and Description of Pain Patient Has Paino No Site Locations  Pain Management and Medication Current Pain Management: Notes Patient denies pain at this time. Electronic Signature(s) PURCELL, JUNGBLUTH ([id number]) [id number] Page 6 of 14 Signed: 11/18/2022 5:03:01 PM By: Elliot Gurney, BSN, RN, CWS, Kim RN, BSN Entered By: Elliot Gurney, BSN, RN, CWS, Kim on 11/13/2022 09:29:48 -------------------------------------------------------------------------------- Patient/Caregiver Education Details Patient Name: Date of Service: [place], RO [name] 4/18/2024andnbsp9:00 A M Medical Record Number: [id number] Patient Account Number: [id number] Date of Birth/Gender: Treating RN: 17-Jul-1961 (62 y.o. Arthur Holms Primary Care Physician: [name] Other  Clinician: Referring Physician: Treating Physician/Extender: [name] in Treatment: 110 Education Assessment Education Provided To: Patient Education Topics Provided Wound/Skin Impairment: Handouts: Caring for Your Ulcer Methods: Demonstration, Explain/Verbal Responses: State content correctly Electronic Signature(s) Signed: 11/18/2022 5:03:01 PM By: Elliot Gurney, BSN, RN, CWS, Kim RN, BSN Entered By: Elliot Gurney, BSN, RN, CWS, Kim on 11/13/2022 10:31:56 -------------------------------------------------------------------------------- Wound Assessment Details Patient Name: Date of Service: Chad [name], Chad [name] 11/13/2022 9:00 A M Medical Record Number: [id number] Patient Account Number: [id number] Date of Birth/Sex: Treating RN: 04-24-1961 (62 y.o. Loel Lofty, Selena Batten Primary Care Donney Caraveo: [name] Other Clinician: Referring Elfa Wooton: Treating Tysheka Fanguy/Extender: [name] Weeks in Treatment: 48 Wound Status Wound Number: 12 Primary Venous Leg Ulcer Etiology: Wound Location: Left, Distal, Medial Ankle Wound Open Wounding Event: Gradually Appeared Status: Date Acquired: 07/12/2019 Comorbid Anemia, Chronic Obstructive Pulmonary Disease (COPD), Coronary Weeks Of Treatment: 48 History: Artery Disease, Peripheral Arterial Disease, Peripheral Venous Clustered Wound: No Disease, Hepatitis C, Osteoarthritis, Neuropathy, Received Chemotherapy Photos Wound Measurements Length: (cm) 1.2 Width: (cm) 0.9 Depth: (cm) 0.1 Area: (cm) 0. Volume: (cm) 0. XZAYVION, VAETH ([id number]) % Reduction in Area: 87.7% % Reduction in Volume: 93.8% Epithelialization: None 848 Tunneling: No 085 Undermining: No [id number] Page 7 of 14 Wound Description Classification: Full Thickness Without Exposed Support Structures Wound Margin: Distinct, outline attached Exudate Amount: Medium Exudate Type: Serosanguineous Exudate Color: red, brown Foul  Odor After Cleansing: No Slough/Fibrino Yes Wound Bed Granulation Amount: Small (1-33%) Exposed Structure Granulation Quality: Pink Fascia Exposed: No Necrotic Amount: Large (67-100%) Fat Layer (Subcutaneous Tissue) Exposed: Yes Necrotic Quality: Adherent Slough Tendon Exposed: No Muscle Exposed: No Joint Exposed: No Bone Exposed: No Treatment Notes Wound #12 (Ankle) Wound Laterality: Left, Medial, Distal Cleanser Soap and Water Discharge Instruction: Gently cleanse wound with antibacterial soap, rinse and pat dry prior to dressing wounds Wound Cleanser Discharge Instruction: Wash your hands with soap and water. Remove old dressing, discard into plastic bag and place into trash. Cleanse the wound with Wound Cleanser prior to applying a clean dressing using gauze sponges, not tissues or cotton balls. Do not scrub or use excessive force. Pat dry using gauze sponges, not tissue or cotton balls. Peri-Wound Care AandD Ointment Discharge Instruction: Apply AandD Ointment to areas of redness Topical Primary Dressing Silvercel Small 2x2 (in/in) Discharge Instruction: Apply Silvercel Small 2x2 (in/in) as instructed Secondary Dressing ABD Pad 5x9 (in/in) Discharge Instruction: Cover with ABD pad Secured With Kerlix Roll Sterile or Non-Sterile 6-ply 4.5x4 (yd/yd) Discharge Instruction: Apply Kerlix as directed Compression Wrap Compression Stockings Add-Ons Electronic Signature(s) Signed: 11/18/2022 5:03:01 PM By: Elliot Gurney, BSN, RN, CWS, Kim RN, BSN Entered By: Elliot Gurney, BSN, RN, CWS, Kim on 11/13/2022 09:40:24 -------------------------------------------------------------------------------- Wound Assessment Details Patient Name: Date of Service: Chad [name], Chad [name] 11/13/2022 9:00 A M Medical Record Number: [id number] Patient Account Number: [id number] Date of Birth/Sex: Treating RN: 1961/04/28 (62 y.o. Arthur Holms Primary Care Estela Vinal: [name] Other Clinician: Referring  Zayanna Pundt: Treating Charnelle Bergeman/Extender: [name] Tania Ade  in Treatment: 48 Wound Status Wound Number: 15 Primary Venous Leg Ulcer Etiology: Wound Location: Left, Dorsal Foot Wound Open Wounding Event: Gradually Appeared Status: Date Acquired: 10/01/2022 Comorbid Anemia, Chronic Obstructive Pulmonary Disease (COPD), Coronary [name] ([id number]) [id number] Page 8 of 14 Comorbid Anemia, Chronic Obstructive Pulmonary Disease (COPD), Coronary Weeks Of Treatment: 5 History: Artery Disease, Peripheral Arterial Disease, Peripheral Venous Clustered Wound: Yes Disease, Hepatitis C, Osteoarthritis, Neuropathy, Received Chemotherapy Photos Wound Measurements Length: (cm) 3.5 Width: (cm) 3 Depth: (cm) 0.2 Clustered Quantity: 2 Area: (cm) 8.24 Volume: (cm) 1.64 % Reduction in Area: -250% % Reduction in Volume: -598.7% Epithelialization: None 7 9 Wound Description Classification: Full Thickness Without Exposed Supp Exudate Amount: Medium Exudate Type: Serosanguineous Exudate Color: red, brown ort Structures Foul Odor After Cleansing: No Slough/Fibrino Yes Wound Bed Granulation Amount: Small (1-33%) Exposed Structure Granulation Quality: Red Fascia Exposed: No Necrotic Amount: Large (67-100%) Fat Layer (Subcutaneous Tissue) Exposed: Yes Necrotic Quality: Adherent Slough Tendon Exposed: No Muscle Exposed: No Joint Exposed: No Bone Exposed: No Treatment Notes Wound #15 (Foot) Wound Laterality: Dorsal, Left Cleanser Soap and Water Discharge Instruction: Gently cleanse wound with antibacterial soap, rinse and pat dry prior to dressing wounds Wound Cleanser Discharge Instruction: Wash your hands with soap and water. Remove old dressing, discard into plastic bag and place into trash. Cleanse the wound with Wound Cleanser prior to applying a clean dressing using gauze sponges, not tissues or cotton balls. Do not scrub or use  excessive force. Pat dry using gauze sponges, not tissue or cotton balls. Peri-Wound Care AandD Ointment Discharge Instruction: Apply AandD Ointment to areas of redness Topical Primary Dressing Silvercel Small 2x2 (in/in) Discharge Instruction: Apply Silvercel Small 2x2 (in/in) as instructed Secondary Dressing ABD Pad 5x9 (in/in) Discharge Instruction: Cover with ABD pad Secured With Kerlix Roll Sterile or Non-Sterile 6-ply 4.5x4 (yd/yd) Discharge Instruction: Apply Kerlix as directed Compression Wrap Compression Stockings [name] ([id number]) [id number] Page 9 of 14 Add-Ons Electronic Signature(s) Signed: 11/18/2022 5:03:01 PM By: Elliot Gurney, BSN, RN, CWS, Kim RN, BSN Entered By: Elliot Gurney, BSN, RN, CWS, Kim on 11/13/2022 09:39:39 -------------------------------------------------------------------------------- Wound Assessment Details Patient Name: Date of Service: Chad [name], Chad [name] 11/13/2022 9:00 A M Medical Record Number: [id number] Patient Account Number: [id number] Date of Birth/Sex: Treating RN: 19-Apr-1961 (62 y.o. Loel Lofty, Selena Batten Primary Care Anush Wiedeman: [name] Other Clinician: Referring Radames Mejorado: Treating Kin Galbraith/Extender: [name] Weeks in Treatment: 48 Wound Status Wound Number: 16 Primary Pressure Ulcer Etiology: Wound Location: Right Calcaneus Wound Open Wounding Event: Gradually Appeared Status: Date Acquired: 09/26/2022 Comorbid Anemia, Chronic Obstructive Pulmonary Disease (COPD), Coronary Weeks Of Treatment: 5 History: Artery Disease, Peripheral Arterial Disease, Peripheral Venous Clustered Wound: No Disease, Hepatitis C, Osteoarthritis, Neuropathy, Received Chemotherapy Photos Photo Uploaded By: Elliot Gurney, BSN, RN, CWS, Kim on 11/13/2022 10:53:07 Wound Measurements Length: (cm) 0.8 Width: (cm) 1.2 Depth: (cm) 0.1 Area: (cm) 0.754 Volume: (cm) 0.075 % Reduction in Area: 20% % Reduction in Volume:  60.1% Epithelialization: None Tunneling: No Undermining: No Wound Description Classification: Category/Stage III Exudate Amount: Medium Exudate Type: Serosanguineous Exudate Color: red, brown Foul Odor After Cleansing: No Slough/Fibrino Yes Wound Bed Granulation Amount: Medium (34-66%) Exposed Structure Granulation Quality: Red Fascia Exposed: No Necrotic Amount: Medium (34-66%) Fat Layer (Subcutaneous Tissue) Exposed: Yes Necrotic Quality: Adherent Slough Tendon Exposed: No Muscle Exposed: No Joint Exposed: No Bone Exposed: No Treatment Notes Wound #16 (Calcaneus) Wound Laterality: Right Cleanser Soap and Water Discharge Instruction: Gently cleanse wound with antibacterial soap, rinse and pat dry prior to  dressing wounds Wound Cleanser CORBITT, CLOKE ([id number]) [id number] Page 10 of 14 Discharge Instruction: [facility] your hands with soap and water. Remove old dressing, discard into plastic bag and place into trash. Cleanse the wound with Wound Cleanser prior to applying a clean dressing using gauze sponges, not tissues or cotton balls. Do not scrub or use excessive force. Pat dry using gauze sponges, not tissue or cotton balls. Peri-Wound Care AandD Ointment Discharge Instruction: Apply AandD Ointment to areas of redness Topical Primary Dressing Silvercel Small 2x2 (in/in) Discharge Instruction: Apply Silvercel Small 2x2 (in/in) as instructed Secondary Dressing ABD Pad 5x9 (in/in) Discharge Instruction: Cover with ABD pad Secured With Kerlix Roll Sterile or Non-Sterile 6-ply 4.5x4 (yd/yd) Discharge Instruction: Apply Kerlix as directed Compression Wrap Compression Stockings Add-Ons Electronic Signature(s) Signed: 11/18/2022 5:03:01 PM By: Elliot Gurney, BSN, RN, CWS, Kim RN, BSN Entered By: Elliot Gurney, BSN, RN, CWS, Kim on 11/13/2022 09:41:23 -------------------------------------------------------------------------------- Wound Assessment  Details Patient Name: Date of Service: Chad [name], Chad [name] 11/13/2022 9:00 A M Medical Record Number: [id number] Patient Account Number: [id number] Date of Birth/Sex: Treating RN: 02-27-1961 (62 y.o. Loel Lofty, Selena Batten Primary Care Greely Atiyeh: [name] Other Clinician: Referring Ellean Firman: Treating Tula Schryver/Extender: [name] Weeks in Treatment: 48 Wound Status Wound Number: 17 Primary Pressure Ulcer Etiology: Wound Location: Left, Anterior Lower Leg Wound Open Wounding Event: Pressure Injury Status: Date Acquired: 09/30/2022 Comorbid Anemia, Chronic Obstructive Pulmonary Disease (COPD), Coronary Weeks Of Treatment: 4 History: Artery Disease, Peripheral Arterial Disease, Peripheral Venous Clustered Wound: No Disease, Hepatitis C, Osteoarthritis, Neuropathy, Received Chemotherapy Photos Wound Measurements Length: (cm) 3.5 Width: (cm) 2.6 Depth: (cm) 0.3 Area: (cm) 7.147 Volume: (cm) 2.144 % Reduction in Area: -73.3% % Reduction in Volume: -420.4% Epithelialization: None Tunneling: No Undermining: No Wound Description COLMAN, BIRDWELL ([id number]) Classification: Category/Stage II Exudate Amount: Medium Exudate Type: Serosanguineous Exudate Color: red, brown [id number] Page 11 of 14 Foul Odor After Cleansing: No Slough/Fibrino Yes Wound Bed Granulation Amount: Small (1-33%) Exposed Structure Granulation Quality: Red Fascia Exposed: No Necrotic Amount: Large (67-100%) Fat Layer (Subcutaneous Tissue) Exposed: Yes Necrotic Quality: Eschar Tendon Exposed: No Muscle Exposed: No Joint Exposed: No Bone Exposed: No Treatment Notes Wound #17 (Lower Leg) Wound Laterality: Left, Anterior Cleanser Soap and Water Discharge Instruction: Gently cleanse wound with antibacterial soap, rinse and pat dry prior to dressing wounds Wound Cleanser Discharge Instruction: Wash your hands with soap and water. Remove old dressing, discard into  plastic bag and place into trash. Cleanse the wound with Wound Cleanser prior to applying a clean dressing using gauze sponges, not tissues or cotton balls. Do not scrub or use excessive force. Pat dry using gauze sponges, not tissue or cotton balls. Peri-Wound Care AandD Ointment Discharge Instruction: Apply AandD Ointment to areas of redness Topical Primary Dressing Silvercel Small 2x2 (in/in) Discharge Instruction: Apply Silvercel Small 2x2 (in/in) as instructed Secondary Dressing ABD Pad 5x9 (in/in) Discharge Instruction: Cover with ABD pad Secured With Kerlix Roll Sterile or Non-Sterile 6-ply 4.5x4 (yd/yd) Discharge Instruction: Apply Kerlix as directed Compression Wrap Compression Stockings Add-Ons Electronic Signature(s) Signed: 11/18/2022 5:03:01 PM By: Elliot Gurney, BSN, RN, CWS, Kim RN, BSN Entered By: Elliot Gurney, BSN, RN, CWS, Kim on 11/13/2022 09:43:04 -------------------------------------------------------------------------------- Wound Assessment Details Patient Name: Date of Service: Chad [name], Chad [name] 11/13/2022 9:00 A M Medical Record Number: [id number] Patient Account Number: [id number] Date of Birth/Sex: Treating RN: 07-27-1961 (62 y.o. Arthur Holms Primary Care Isaiah Torok: [name] Other Clinician: Referring Carlynn Leduc: Treating Oda Lansdowne/Extender: [name] Weeks in Treatment: 48 Wound Status  Wound Number: 18 Primary Etiology: Pressure Ulcer Wound Location: Right, Medial T Second oe Wound Status: Open Wounding Event: Pressure Injury Date Acquired: 09/26/2022 Weeks Of Treatment: 4 Clustered Wound: No [name] ([id number]) [id number] Page 12 of 14 Photos Photo Uploaded By: Elliot Gurney, BSN, RN, CWS, Kim on 11/13/2022 10:53:07 Wound Measurements Length: (cm) 0.8 Width: (cm) 0.8 Depth: (cm) 0.1 Area: (cm) 0.503 Volume: (cm) 0.05 % Reduction in Area: 8.5% % Reduction in Volume: 9.1% Wound Description Classification:  Category/Stage II Exudate Amount: Medium Exudate Type: Serosanguineous Exudate Color: red, brown Treatment Notes Wound #18 (Toe Second) Wound Laterality: Right, Medial Cleanser Soap and Water Discharge Instruction: Gently cleanse wound with antibacterial soap, rinse and pat dry prior to dressing wounds Wound Cleanser Discharge Instruction: Wash your hands with soap and water. Remove old dressing, discard into plastic bag and place into trash. Cleanse the wound with Wound Cleanser prior to applying a clean dressing using gauze sponges, not tissues or cotton balls. Do not scrub or use excessive force. Pat dry using gauze sponges, not tissue or cotton balls. Peri-Wound Care AandD Ointment Discharge Instruction: Apply AandD Ointment to areas of redness Topical Primary Dressing Silvercel Small 2x2 (in/in) Discharge Instruction: Apply Silvercel Small 2x2 (in/in) as instructed Secondary Dressing ABD Pad 5x9 (in/in) Discharge Instruction: Cover with ABD pad Secured With Kerlix Roll Sterile or Non-Sterile 6-ply 4.5x4 (yd/yd) Discharge Instruction: Apply Kerlix as directed Compression Wrap Compression Stockings Add-Ons Electronic Signature(s) Signed: 11/18/2022 5:03:01 PM By: Elliot Gurney, BSN, RN, CWS, Kim RN, BSN Entered By: Elliot Gurney, BSN, RN, CWS, Kim on 11/13/2022 09:43:17 -------------------------------------------------------------------------------- Wound Assessment Details Patient Name: Date of Service: Chad [name], Chad [name] 11/13/2022 9:00 A Adrienne Mocha ([id number]) [id number] Page 13 of 14 Medical Record Number: [id number] Patient Account Number: [id number] Date of Birth/Sex: Treating RN: 12-20-1960 (61 y.o. Loel Lofty, Selena Batten Primary Care Vernesha Talbot: [name] Other Clinician: Referring Sheronica Corey: Treating Ronaldo Crilly/Extender: [name] Weeks in Treatment: 48 Wound Status Wound Number: 19 Primary Venous Leg Ulcer Etiology: Wound Location: Left  Lower Leg Wound Open Wounding Event: Gradually Appeared Status: Date Acquired: 11/10/2022 Comorbid Anemia, Chronic Obstructive Pulmonary Disease (COPD), Coronary Weeks Of Treatment: 0 History: Artery Disease, Peripheral Arterial Disease, Peripheral Venous Clustered Wound: No Disease, Hepatitis C, Osteoarthritis, Neuropathy, Received Chemotherapy Wound Measurements Length: (cm) 1 Width: (cm) 0.9 Depth: (cm) 0.1 Area: (cm) 0.707 Volume: (cm) 0.071 % Reduction in Area: % Reduction in Volume: Epithelialization: None Tunneling: No Undermining: No Wound Description Classification: Full Thickness Without Exposed Support Structures Wound Margin: Flat and Intact Exudate Amount: Medium Exudate Type: Serous Exudate Color: amber Foul Odor After Cleansing: No Slough/Fibrino Yes Wound Bed Granulation Amount: None Present (0%) Exposed Structure Necrotic Amount: Large (67-100%) Fascia Exposed: No Necrotic Quality: Adherent Slough Fat Layer (Subcutaneous Tissue) Exposed: Yes Tendon Exposed: No Muscle Exposed: No Joint Exposed: No Bone Exposed: No Treatment Notes Wound #19 (Lower Leg) Wound Laterality: Left Cleanser Soap and Water Discharge Instruction: Gently cleanse wound with antibacterial soap, rinse and pat dry prior to dressing wounds Wound Cleanser Discharge Instruction: Wash your hands with soap and water. Remove old dressing, discard into plastic bag and place into trash. Cleanse the wound with Wound Cleanser prior to applying a clean dressing using gauze sponges, not tissues or cotton balls. Do not scrub or use excessive force. Pat dry using gauze sponges, not tissue or cotton balls. Peri-Wound Care AandD Ointment Discharge Instruction: Apply AandD Ointment to areas of redness Topical Primary Dressing Silvercel Small 2x2 (in/in) Discharge Instruction: Apply Silvercel Small 2x2 (  in/in) as instructed Secondary Dressing ABD Pad 5x9 (in/in) Discharge Instruction: Cover  with ABD pad Secured With Kerlix Roll Sterile or Non-Sterile 6-ply 4.5x4 (yd/yd) Discharge Instruction: Apply Kerlix as directed Compression Wrap Compression Stockings Add-Ons JUSTEN, FONDA ([id number]) [id number] Page 14 of 14 Electronic Signature(s) Signed: 11/18/2022 5:03:01 PM By: Elliot Gurney, BSN, RN, CWS, Kim RN, BSN Entered By: Elliot Gurney, BSN, RN, CWS, Kim on 11/13/2022 09:48:43 -------------------------------------------------------------------------------- Vitals Details Patient Name: Date of Service: Chad [name], Chad [name] 11/13/2022 9:00 A M Medical Record Number: [id number] Patient Account Number: [id number] Date of Birth/Sex: Treating RN: 12/19/1960 (62 y.o. Loel Lofty, Selena Batten Primary Care Joene Gelder: [name] Other Clinician: Referring Moranda Billiot: Treating Keerthana Vanrossum/Extender: [name] Weeks in Treatment: 48 Vital Signs Time Taken: 09:28 Temperature (F): 98.2 Height (in): 69 Pulse (bpm): 89 Weight (lbs): 150 Respiratory Rate (breaths/min): 16 Body Mass Index (BMI): 22.1 Blood Pressure (mmHg): 128/75 Reference Range: 80 - 120 mg / dl Electronic Signature(s) Signed: 11/18/2022 5:03:01 PM By: Elliot Gurney, BSN, RN, CWS, Kim RN, BSN Entered By: Elliot Gurney, BSN, RN, CWS, Kim on 11/13/2022 09:29:17

## 2022-11-19 NOTE — Progress Notes (Signed)
Patient ID: Chad [name], male   DOB: 07/13/1961, 62 y.o.   MRN: [id number]     SURGICAL PROGRESS NOTE   Hospital Day(s): 2.   Interval History: Patient seen and examined, no acute events or new complaints overnight. Patient reports feeling well.  He denies any significant complaints today.  He denies any nausea or vomiting. His colostomy started to get with output.  Vital signs in last 24 hours: [min-max] current  Temp:  [97.8 F (36.6 C)-98.6 F (37 C)] 97.8 F (36.6 C) (04/24 0735) Pulse Rate:  [80-96] 82 (04/24 0735) Resp:  [18-20] 19 (04/24 0735) BP: (119-128)/(72-88) 124/82 (04/24 0735) SpO2:  [92 %-97 %] 97 % (04/24 0735)     Height:  (170.2 cm) Weight: 72.6 kg BMI (Calculated): 25.06   Physical Exam:  Constitutional: alert, cooperative and no distress  Respiratory: breathing non-labored at rest  Cardiovascular: regular rate and sinus rhythm  Gastrointestinal: soft, non-tender, and non-distended.  Colostomy is pink and patent.  Good ostomy output.  Labs:     Latest Ref Rng & Units 11/19/2022    6:03 AM 11/18/2022    4:20 AM 11/04/2022   12:21 PM  CBC  WBC 4.0 - 10.5 K/uL 6.2  10.7  5.1   Hemoglobin 13.0 - 17.0 g/dL 40.9  81.1  91.4   Hematocrit 39.0 - 52.0 % 36.0  38.5  43.4   Platelets 150 - 400 K/uL 145  185  146       Latest Ref Rng & Units 11/19/2022    6:03 AM 11/18/2022    4:20 AM 11/04/2022   12:21 PM  CMP  Glucose 70 - 99 mg/dL 85  782  92   BUN 8 - 23 mg/dL Creatinine 0.61 - 1.24 mg/dL 9.56  2.13  0.86   Sodium 135 - 145 mmol/L 135  137  135   Potassium 3.5 - 5.1 mmol/L 4.0  4.4  4.1   Chloride 98 - 111 mmol/L 109  106  106   CO2 22 - 32 mmol/L Calcium 8.9 - 10.3 mg/dL 7.7  8.2  8.5     Imaging studies: No new pertinent imaging studies   Assessment/Plan:  62 y.o. 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   -Continue adequate recovery.  Stable vital signs. -No significant complaint.  No clinical laceration. -Adequate ostomy output.  Will advance diet to soft diet. -Continue pain management -Continue with DVT prophylaxis  Gae Gallop, MD

## 2022-11-19 NOTE — Consult Note (Signed)
WOC Nurse ostomy consult note Stoma type/location: LLQ colostomy Stomal assessment/size: 1 1/4" budded pale pink and moist Peristomal assessment: intact  midline staple line, edges approximated Treatment options for stomal/peristomal skin: barrier ring 2 piece pouch Output soft brown stool  Ostomy pouching: 2pc.  With barrier ring Education provided: patient is alert and asks questions regarding care although minimally interested in performing his own care.  I reinforce that he can care for his ostomy at the skilled facility, including emptying pouch when 1/3 full  and twice weekly pouch changes.  We remove pouch, cleanse skin. I discuss rationale for barrier ring and 2 piece pouch.  He has supplies in his room.  Enrolled patient in [employer] DC program: Yes will today.  Will follow Mike Gip MSN, RN, FNP-BC CWON Wound, Ostomy, Continence Nurse [facility]patient [facility] [phone] Pager [phone]

## 2022-11-19 NOTE — [place] FL2 (Signed)
[place] MEDICAID FL2 LEVEL OF CARE FORM     IDENTIFICATION  Patient Name: Chad [name] Birthdate: 09/28/1960 Sex: male Admission Date (Current Location): 11/17/2022  [facility] and [place] Number:  Chiropodist and Address:         Provider Number: [id number]  Attending Physician Name and Address:  Carolan Shiver, MD  Relative Name and Phone Number:       Current Level of Care: Hospital Recommended Level of Care: Nursing Facility Prior Approval Number:    Date Approved/Denied:   PASRR Number:    Discharge Plan: SNF    Current Diagnoses: Patient Active Problem List   Diagnosis Date Noted   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   Abnormal CT scan 10/23/2020   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/15/2019   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   Genetic testing 06/15/2019   Swelling of limb 04/15/2019   History of colon cancer 03/24/2019   Family history of prostate cancer    Family history of colon cancer    Family history of leukemia    Family history of kidney cancer    Goals of care, counseling/discussion 12/25/2018   Chronic pain syndrome 11/18/2018   Peripheral vascular disease of lower extremity with ulceration 10/04/2018   Neoplasm related pain 09/08/2018   Poor appetite 09/08/2018   Weakness generalized 07/02/2018   Anorexia 07/02/2018   Chronic pain due to neoplasm 07/02/2018   Iron deficiency anemia due to chronic blood loss 05/27/2018   Ureteral cancer, 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   Palliative care by specialist    Palliative care encounter    Hydronephrosis    Urothelial cancer    Dysphagia    Protein-calorie malnutrition, severe 04/26/2018   Severe sepsis 04/25/2018   Sepsis 04/25/2018    Orientation RESPIRATION BLADDER Height & Weight     Self, Time, Situation, Place  Normal Continent Weight: 72.6 kg Height:   (170.2 cm)  BEHAVIORAL SYMPTOMS/MOOD NEUROLOGICAL BOWEL NUTRITION STATUS      Colostomy Diet  (soft)  AMBULATORY STATUS COMMUNICATION OF NEEDS Skin   Total Care Verbally Surgical wounds, Other (Comment) (Venous ulcers)                       Personal Care Assistance Level of Assistance              Functional Limitations Info             SPECIAL CARE FACTORS FREQUENCY                       Contractures Contractures Info: Not present    Additional Factors Info  Code Status, Allergies Code Status Info: full Allergies Info: penicillin           Current Medications (11/19/2022):  This is the current hospital active medication list Current Facility-Administered Medications  Medication Dose Route Frequency Provider Last Rate Last Admin   0.9 %  sodium chloride infusion   Intravenous Continuous Carolan Shiver, MD   Stopped at 11/19/22 0935   celecoxib (CELEBREX) capsule 200 mg  200 mg Oral BID Carolan Shiver, MD   200 mg at 11/19/22 0942   cyclobenzaprine (FLEXERIL) tablet 5 mg  5 mg Oral QHS Carolan Shiver, MD   5 mg at 11/18/22 2115   enoxaparin (LOVENOX) injection 40 mg  40 mg Subcutaneous Q24H Carolan Shiver, MD   40 mg at 11/19/22 0942   escitalopram (LEXAPRO) tablet 5 mg  5 mg Oral Daily Carolan Shiver, MD   5 mg at 11/19/22 0943   gabapentin (NEURONTIN) capsule 100 mg  100 mg Oral TID Carolan Shiver, MD   100 mg at 11/19/22 0944   mirtazapine (REMERON) tablet 7.5 mg  7.5 mg Oral QHS Carolan Shiver, MD   7.5 mg at 11/18/22 2115   morphine (PF) 4 MG/ML injection 4 mg  4 mg Intravenous Q4H PRN Carolan Shiver, MD   4 mg at 11/18/22 2038   ondansetron (ZOFRAN-ODT) disintegrating tablet 4 mg  4 mg Oral Q6H PRN Carolan Shiver, MD       Or   ondansetron (ZOFRAN) injection 4 mg  4 mg Intravenous Q6H PRN Carolan Shiver, MD       oxyCODONE (Oxy IR/ROXICODONE) immediate release tablet 10 mg  10 mg Oral Q6H Cintron-Diaz, Lurline Idol, MD   10 mg at 11/19/22 1311   pantoprazole (PROTONIX) EC tablet 40  mg  40 mg Oral QHS Tressie Ellis, RPH   40 mg at 11/18/22 2115   polyethylene glycol (MIRALAX / GLYCOLAX) packet 17 g  17 g Oral Daily Carolan Shiver, MD   17 g at 11/19/22 1610   polyvinyl alcohol (LIQUIFILM TEARS) 1.4 % ophthalmic solution 1 drop  1 drop Both Eyes TID Carolan Shiver, MD   1 drop at 11/19/22 9604   senna-docusate (Senokot-S) tablet 1 tablet  1 tablet Oral Daily Carolan Shiver, MD   1 tablet at 11/19/22 0943   tamsulosin (FLOMAX) capsule 0.4 mg  0.4 mg Oral QPC supper Carolan Shiver, MD   0.4 mg at 11/18/22 1736   Facility-Administered Medications Ordered in Other Encounters  Medication Dose Route Frequency Provider Last Rate Last Admin   heparin lock flush 100 UNIT/ML injection              Discharge Medications: Please see discharge summary for a list of discharge medications.  Relevant Imaging Results:  Relevant Lab Results:   Additional Information    Chapman Fitch, RN

## 2022-11-20 ENCOUNTER — Ambulatory Visit: Payer: Medicaid Other | Admitting: Physician Assistant

## 2022-11-20 LAB — TYPE AND SCREEN
Unit division: 0
Unit division: 0

## 2022-11-20 LAB — BPAM RBC
Blood Product Expiration Date: 202405302359
Unit Type and Rh: 9500
Unit Type and Rh: 9500

## 2022-11-20 MED ORDER — TRAMADOL HCL 50 MG PO TABS: 50.0000 mg | ORAL_TABLET | Freq: Four times a day (QID) | ORAL | 0 refills | Status: DC | PRN

## 2022-11-20 NOTE — Progress Notes (Signed)
Patient refused turning and repositioning.

## 2022-11-20 NOTE — Progress Notes (Addendum)
Pt is refusing wound care, stated "ill just let the wound dr take care of it", RN educated pt that wound care nurse may not be able to see him today and patient still refused. Will try again before 7am.   [phone]- pt is still refusing wound care, will make dayshift RN aware.   Care ongoing

## 2022-11-20 NOTE — TOC Transition Note (Signed)
Transition of Care [facility]) - CM/SW Discharge Note   Patient Details  Name: Chad [name] MRN: [id number] Date of Birth: 20-Jul-1961  Transition of [facility]) CM/SW Contact:  Chapman Fitch, RN Phone Number: 11/20/2022, 1:14 PM   Clinical Narrative:     Patient will DC to: [facility] with [facility] to pick up patients [facility] Anticipated DC date: 11/20/22  Family notified: Patient to notify son Transport [id number]  Per MD patient ready for DC to . RN, patient, and facility notified of DC. Discharge Summary sent to facility. RN given number for report. DC packet on chart. Ambulance transport requested for patient.  TOC signing off.  Bevelyn Ngo [facility] [phone]         Patient Goals and CMS Choice      Discharge Placement                         Discharge Plan and Services Additional resources added to the After Visit Summary for                                       Social Determinants of Health (SDOH) Interventions SDOH Screenings   Food Insecurity: No Food Insecurity (11/17/2022)  Housing: Low Risk  (11/17/2022)  Transportation Needs: No Transportation Needs (11/17/2022)  Utilities: Not At Risk (11/17/2022)  Tobacco Use: High Risk (11/17/2022)     Readmission Risk Interventions     No data to display

## 2022-11-20 NOTE — Discharge Summary (Signed)
Patient ID: Chad [name] MRN: [id number] DOB/AGE: 64/09/1960 62 y.o.  Admit date: 11/17/2022 Discharge date: 11/20/2022   Discharge Diagnoses:  Principal Problem:   Cancer of sigmoid   Procedures: Robotic assisted laparoscopic partial colectomy with end colostomy creation  Hospital Course: Patient with stage III sigmoid cancer.  Due to not responding to the chemotherapy was elected to proceed with partial resection with end colostomy creation this by high risk of complications due to comorbidities.  He tolerated the procedure well.  He has been recovering adequately.  The wounds are dry and clean.  He is tolerating diet.  There is adequate ostomy output.  No nausea or vomiting.  Tolerating soft diet.  Physical Exam Vitals reviewed.  HENT:     Head: Normocephalic.  Cardiovascular:     Rate and Rhythm: Normal rate and regular rhythm.  Pulmonary:     Effort: Pulmonary effort is normal.     Breath sounds: Normal breath sounds.  Abdominal:     General: Abdomen is flat. Bowel sounds are normal. There is no distension.     Palpations: Abdomen is soft.     Tenderness: There is no abdominal tenderness.  Musculoskeletal:     Cervical back: Normal range of motion.  Neurological:     General: No focal deficit present.     Mental Status: He is alert and oriented to person, place, and time.   The left lower quadrant colostomy is pink and patent.   Consults: Ostomy nurse  Disposition: Discharge disposition: 01-Home or Self Care       Discharge Instructions     Diet - low sodium heart healthy   Complete by: As directed    Increase activity slowly   Complete by: As directed       Allergies as of 11/20/2022       Reactions   Penicillins Rash        Medication List     TAKE these medications    acetaminophen 325 MG tablet Commonly known as: TYLENOL Take 650 mg by mouth 3 (three) times daily.   aspirin EC 81 MG tablet Take 1 tablet (81 mg total) by mouth  daily.   atorvastatin 10 MG tablet Commonly known as: Lipitor Take 1 tablet (10 mg total) by mouth daily.   CALCIUM CARBONATE-VITAMIN D3 PO Take 2 tablets by mouth daily. 600-400 mg   carboxymethylcellulose 1 % ophthalmic solution Apply 1 drop to eye 3 (three) times daily. Left eye   clopidogrel 75 MG tablet Commonly known as: PLAVIX Take 75 mg by mouth daily.   collagenase 250 UNIT/GM ointment Commonly known as: SANTYL Apply 1 Application topically daily as needed.   cyclobenzaprine 7.5 MG tablet Commonly known as: FEXMID Take 15 mg by mouth at bedtime.   escitalopram 5 MG tablet Commonly known as: LEXAPRO Take 5 mg by mouth daily.   gabapentin 100 MG capsule Commonly known as: NEURONTIN Take 100 mg by mouth 3 (three) times daily.   mirtazapine 7.5 MG tablet Commonly known as: REMERON Take 7.5 mg by mouth at bedtime.   oxycodone 5 MG capsule Commonly known as: OXY-IR Take 10 mg by mouth every 6 (six) hours.   Pro-Stat Liqd Take 45 mLs by mouth daily.   senna 8.6 MG Tabs tablet Commonly known as: SENOKOT Take 1 tablet by mouth at bedtime.   senna-docusate 8.6-50 MG tablet Commonly known as: Senokot-S Take 1 tablet by mouth daily.   tamsulosin 0.4 MG Caps capsule Commonly known as: Flomax  Take 1 capsule (0.4 mg total) by mouth daily after supper.   traMADol 50 MG tablet Commonly known as: Ultram Take 1 tablet (50 mg total) by mouth every 6 (six) hours as needed.        Follow-up Information     Carolan Shiver, MD Follow up in 2 week(s).   Specialty: General Surgery Contact information: [address] [place] [address] [phone]

## 2022-11-20 NOTE — Discharge Instructions (Signed)
  Diet: Resume home heart healthy regular diet.   Activity: No heavy lifting >20 pounds (children, pets, laundry, garbage) or strenuous activity until follow-up, but light activity and walking are encouraged. Do not drive or drink alcohol if taking narcotic pain medications.  Wound care: May shower with soapy water and pat dry (do not rub incisions), but no baths or submerging incision underwater until follow-up. (no swimming)   Medications: Resume all home medications. For mild to moderate pain: acetaminophen (Tylenol) or ibuprofen (if no kidney disease). Combining Tylenol with alcohol can substantially increase your risk of causing liver disease. Narcotic pain medications, if prescribed, can be used for severe pain, though may cause nausea, constipation, and drowsiness. If you do not need the narcotic pain medication, you do not need to fill the prescription.  Call office ([phone]) at any time if any questions, worsening pain, fevers/chills, bleeding, drainage from incision site, or other concerns.  

## 2022-11-20 NOTE — Progress Notes (Signed)
Wound dressings changed. Wafer and pouch changed. Incont brief applied. Report called to [phone] nurse for room 93B. One appliance change sent with EMS staff. Vital signs stable. IV removed. Patient being transported back to facility by EMS.  Cornell Barman Krissie Merrick

## 2022-11-21 ENCOUNTER — Encounter: Payer: Self-pay | Admitting: Nurse Practitioner

## 2022-11-21 ENCOUNTER — Non-Acute Institutional Stay: Payer: Medicaid Other | Admitting: Nurse Practitioner

## 2022-11-21 DIAGNOSIS — Z720 Tobacco use: Secondary | ICD-10-CM

## 2022-11-21 DIAGNOSIS — C661 Malignant neoplasm of right ureter: Secondary | ICD-10-CM

## 2022-11-21 DIAGNOSIS — Z515 Encounter for palliative care: Secondary | ICD-10-CM

## 2022-11-21 DIAGNOSIS — R630 Anorexia: Secondary | ICD-10-CM

## 2022-11-21 DIAGNOSIS — G894 Chronic pain syndrome: Secondary | ICD-10-CM

## 2022-11-21 NOTE — Progress Notes (Signed)
Therapist, nutritional Palliative Care Consult Note Telephone: [phone]  Fax: [phone]    Date of encounter: 11/21/22 4:11 PM PATIENT NAME: Chad [name] [address] [place] [address]   [phone] (home)  DOB: [birth year] MRN: [id number] PRIMARY CARE PROVIDER:    [facility]  [address] [name] [address] [phone]  RESPONSIBLE PARTY:    Contact Information     Name Relation Home Work Mobile   Regis, Hinton   [phone]     I met face to face with patient in facility. Palliative Care was asked to follow this patient by consultation request of  [facility] to address advance care planning and complex medical decision making. This is a follow up visit.                                  ASSESSMENT AND PLAN / RECOMMENDATIONS: Symptom Management/Plan: 1. 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 Discussed nutrition.   04/01/2022 weight 156.8 lbs 07/31/2022 weight 167.2 lbs 09/12/2022 weight 169 lbs 10/09/2022 weight 162.8 lbs 11/05/2022 weight 160 lbs 3. Chronic pain reviewed; reviewed chronic pain, medications reviewed; Chad [name] endorses manageable with current oxycodone, discussed and educated continue to monitor on pain scale, monitor efficacy vs adverse side effects.Will continue to monitor; encouraged to ask when in pain, Continue to follow by psychotherapy and psychiatry for counseling; supportive services.    4. PVD; recent ABI's ordered by facility resulted and Chad [name] wishes are to continue to pursue aggressive interventions and treatment options. Recommended facility to f/u with Vascular for options. Continue with current care to BLE/wounds   5. Palliative care encounter; Palliative medicine team will continue to support patient,  patient's family, and medical team. Visit consisted of counseling and education dealing with the complex and emotionally intense issues of symptom management and palliative care in the setting of serious and potentially life-threatening illness   5. Smoking cessation; reviewed and declined.           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; # Colon ca- stage III   GOALS: palliative   CURRENT/MOST RECENT THERAPY: Keytruda [C]      Urothelial cancer (HCC)      Initial Diagnosis      Urothelial cancer (HCC)      Ureteral cancer, right (HCC)    05/26/2018 Initial Diagnosis      Ureteral cancer, right (HCC)      06/15/2018 -  Chemotherapy      Patient is on Treatment Plan : BLADDER Pembrolizumab (200) q21d       06/21/2018 - 03/17/2022 Chemotherapy      Patient is on Treatment Plan : urothelial cancer- pembrolizumab q21d  Follow up Palliative Care Visit: Palliative care will continue to follow for complex medical decision making, advance care planning, and clarification of goals. Return 4 to 8 weeks or prn. I spent 45 minutes providing this consultation. More than 50% of the time in this consultation was spent in counseling and care coordination. PPS: 50% Chief Complaint: Follow up palliative consult for complex medical decision making HISTORY OF PRESENT ILLNESS:  Chad [name] is a 62 y.o. year old  male  with multiple medical problems including   late onset CVA, urothelial cancer, prostate cancer, chronic hepatitis C, hyperlipidemia, colon surgery, protein calorie malnutrition, anxiety, depression. Chad [name] resides at [facility], requires assistance for mobility, adl's. Chad [name] does feed himself with declined appetite.  He did have an ED visit 11/04/2022 for hematuria; Office visit 11/05/2022 with vascular sgy for severe atherosclerotic changes of both lower extremities associated with ulceration and tissue loss of the bilateral feet with the plan for angiography BLE with hope for intervention for limb salvage. Hospitalized from 11/17/2022 to 11/20/2022 for Robotic assisted laparoscopic partial colectomy with end colostomy creation, stabilized and d/c back to [facility]. Staff endorses no new changes or concerns. At present Chad [name] is lying sitting in the w/c in his room. We talked about purpose of pc visit, Chad [name] in agreement. We talked about ros, recent events including hospitalization. We talked about his expectations, concerns, coping strategies with colostomy and BLE. We talked about chronic disease, debility, wounds, and quality of life, what brings him joy-smoking cessation declined. We talked about nutrition, importance of supplements for wound healing. Chad Febo endorses pain is controlled with current regimen. We talked about upcoming Oncology appointment. Support provided. Will continue with ongoing discussions of goc, supportive role. PC f/u visit further discussion monitor trends of appetite, weights, monitor for functional, cognitive decline with chronic disease progression, assess any active symptoms, supportive role.   History obtained from review of EMR, discussion with staff and Chad [name].  I reviewed available labs, medications, imaging, studies and related documents from the EMR.  Records reviewed and summarized above.  Exam General: pleasant male, debilitated ENMT: oral  mucous membranes moist CV: S1S2, RRR Pulmonary: breath sounds clear MSK: w/d dependent, +Edema BLE Neuro:  left hemiplegia,  no cognitive impairment Psych: non-anxious affect, A and O x 3  Thank you for the opportunity to participate in the care of Chad. Suleiman. Please call our office at [phone] if we can be of additional assistance.   Briauna Gilmartin [place], NP

## 2022-11-25 ENCOUNTER — Encounter: Payer: Medicaid Other | Admitting: Physician Assistant

## 2022-11-25 DIAGNOSIS — Z5900 Homelessness unspecified: Secondary | ICD-10-CM | POA: Diagnosis not present

## 2022-11-25 DIAGNOSIS — L299 Pruritus, unspecified: Secondary | ICD-10-CM | POA: Diagnosis not present

## 2022-11-25 DIAGNOSIS — Z933 Colostomy status: Secondary | ICD-10-CM | POA: Diagnosis not present

## 2022-11-25 DIAGNOSIS — L98492 Non-pressure chronic ulcer of skin of other sites with fat layer exposed: Secondary | ICD-10-CM | POA: Diagnosis not present

## 2022-11-25 DIAGNOSIS — I69354 Hemiplegia and hemiparesis following cerebral infarction affecting left non-dominant side: Secondary | ICD-10-CM | POA: Diagnosis not present

## 2022-11-25 DIAGNOSIS — B1920 Unspecified viral hepatitis C without hepatic coma: Secondary | ICD-10-CM | POA: Diagnosis not present

## 2022-11-25 DIAGNOSIS — L97322 Non-pressure chronic ulcer of left ankle with fat layer exposed: Secondary | ICD-10-CM | POA: Diagnosis present

## 2022-11-25 DIAGNOSIS — C639 Malignant neoplasm of male genital organ, unspecified: Secondary | ICD-10-CM | POA: Diagnosis not present

## 2022-11-25 DIAGNOSIS — I872 Venous insufficiency (chronic) (peripheral): Secondary | ICD-10-CM | POA: Diagnosis not present

## 2022-11-25 NOTE — Progress Notes (Signed)
TRAEVON, MEIRING ([id number]) [id number] Page 1 of 13 Visit Report for 11/25/2022 Chief Complaint Document Details Patient Name: Date of Service: Chad, [name] 11/25/2022 8:30 A M Medical Record Number: [id number] Patient Account Number: [id number] Date of Birth/Sex: Treating RN: 13-Feb-1961 (62 y.o. Chad [name] Primary Care Provider: [name] Other Clinician: Referring Provider: Treating Provider/Extender: [name] Weeks in Treatment: 50 Information Obtained from: Patient Chief Complaint Left ankle ulcer, Left dorsal foot wound, right heel Electronic Signature(s) Signed: 11/25/2022 4:35:08 PM By: Midge Aver MSN RN CNS WTA Signed: 11/25/2022 6:18:39 PM By: [name] PA-C Previous Signature: 11/25/2022 8:52:33 AM Version By: [name] PA-C Entered By: Midge Aver on 11/25/2022 09:33:48 -------------------------------------------------------------------------------- HPI Details Patient Name: Date of Service: Chad [name] 11/25/2022 8:30 A M Medical Record Number: [id number] Patient Account Number: [id number] Date of Birth/Sex: Treating RN: 1960-12-17 (62 y.o. Chad [name] Primary Care Provider: [name] Other Clinician: Referring Provider: Treating Provider/Extender: [name] Weeks in Treatment: 50 History of Present Illness HPI Description: 10/08/18 on evaluation today patient actually presents to our office for initial evaluation concerning wounds that he has of the bilateral lower extremities. 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 He also has bilateral venous stasis. He apparently has been homeless currently following discharge from the hospital apparently he has been placed at [facility] which is is a skilled nursing facility  locally. Nonetheless fortunately he does not show any signs of infection at this time which is good news. In fact several of the wound actually appears to be showing some signs of improvement already in my pinion. There are a couple areas in the left leg in particular there likely gonna require some sharp debridement to help clear away some necrotic tissue and help with more sufficient healing. No fevers, chills, nausea, or vomiting noted at this time. 10/15/18 on evaluation today patient actually appears to be doing very well in regard to his bilateral lower extremities. He's been tolerating the dressing changes without complication. Fortunately there does not appear to be any evidence of active infection at this time which is great news. Overall I'm actually very pleased with how this has progressed in just one visits time. Readmission: 08/14/2020 upon evaluation today patient presents for re-evaluation here in our clinic. He is having issues with his left ankle region as well as his right toe and his right heel. He tells me that the toe and heel actually began as a area that was itching that he was scratching and then subsequently opened up into wounds. These may have been abscess areas I presume based on what I am seeing currently. With regard to his left ankle region he tells me this was a similar type occurrence although he does have venous stasis this very well may be more of a venous leg ulcer more than anything. Nonetheless I do believe that the patient would benefit from appropriate and aggressive wound care to try to help get things under better control here. He does have history of a stroke on the left side affecting him to some degree there that he is able to stand although he does have some residual weakness. Otherwise again the patient does have chronic venous insufficiency as previously noted. His arterial studies most recently obtained showed that he had an ABI on the right of 1.16 with  a TBI of 0.52 and on the left and ABI of 1.14 with a TBI of 0.81. That was obtained on 06/19/2020. 08/28/2020 upon evaluation today patient appears to be doing decently well in regard to his wounds in general. He has been tolerating the dressing changes without complication. Fortunately there does not appear to be any signs of active infection which is great news. With that being said I think the [facility] is doing a good job I would recommend that we likely continue with that currently. 09/11/2020 upon evaluation today patient's wounds did not appear to be doing too poorly but again he is not really showing signs of significant improvement with regard to any of the wounds on the right. None of them have Hydrofera Blue on them I am not exactly sure why this is not being followed as the facility did not contact us to let us know of any issues with obtaining dressings or otherwise. With that being said he is supposed to be using Hydrofera Blue on both of the wounds on the right foot as well as the ankle wound on the left side. 09/18/2020 upon evaluation today patient appears to be doing poorly with regard to his wounds. Again right now the left ankle in particular showed signs of extreme maceration. Apparently he was told by someone with staff at [facility] they could not get the [facility]. With that being said this is something that is never been relayed to [place] one way or another. Also the patient subsequently has not supposed to have a border gauze dressing on. He should have an ABD pad and roll gauze to secure as this drains much too much just to have a border gauze dressing to cover. Nonetheless the fact that they are not using the appropriate dressing is directly causing deterioration of the left ankle wound it is significantly worse today compared to what it was previous. I did attempt to call [facility] while the patient was here I called three times and got no one to even  pick up the phone. After this I had my for an office coordinator call and she was able to finally get through and leave a message with the D ON as of dictation of this note which is roughly about an hour and a half later I still have not been able to speak with anyone at the facility. INOCENTE, KRACH ([id number]) [id number] Page 2 of 13 09/25/2020 upon evaluation today patient actually showing signs of good improvement which is excellent news. He has been tolerating the dressing changes without complication. Fortunately there is no signs of active infection which is great news. No fevers, chills, nausea, vomiting, or diarrhea. I do feel like the facility has been doing a much better job at taking care of him as far as the dressings are concerned. However the director of nursing never did call me back. 10/09/2020 upon evaluation today patient appears to be doing well with regard to his wound. The toe ulcer did require some debridement but the other 2 areas actually appear to be doing quite well. 10/19/2020 upon evaluation today patient actually appears to be doing very well in regard to his wounds. In fact the heel does appear to be completely healed. The toe is doing better in the medial ankle on the left is also doing better. Overall I think he is headed in the right direction. 10/26/2020 upon evaluation today patient appears to be doing well with regard to his wound. He is  showing signs of improvement which is great news and overall I am very pleased with where things stand today. No fevers, chills, nausea, vomiting, or diarrhea. 11/02/2020 upon evaluation today patient appears to be doing well with regard to his wounds. He has been tolerating the dressing changes without complication overall I am extremely pleased with where things stand today. He in regard to the toe is almost completely healed and the medial ankle on the left is doing much better. 11/09/2020 upon evaluation  today patient appears to be doing a little poorly in regard to his left medial ankle ulcer. Fortunately there does not appear to be any signs of systemic infection but unfortunately locally he does appear to be infected in fact he has blue-green drainage consistent with Pseudomonas. 11/16/2020 upon evaluation today patient appears to be doing well with regard to his wound. It actually appears to be doing better. I did place him on gentamicin cream since the Cipro was actually resistant even though he was positive for Pseudomonas on culture. Overall I think that he does seem to be doing better though I am unsure whether or not they have actually been putting the cream on. The patient is not sure that we did talk to the nurse directly and she was going to initiate that treatment. Fortunately there does not appear to be any signs of active infection at this time. No fevers, chills, nausea, vomiting, or diarrhea. 4/28; the area on the right second toe is close to healed. Left medial ankle required debridement 12/07/2020 upon evaluation today patient appears to be doing well with regard to his wounds. In fact the right second toe appears to be completely healed which is great news. Fortunately there does not appear to be any signs of active infection at this time which is also great news. I think we can probably discontinue the gentamicin on top of everything else. 12/14/2020 upon evaluation today patient appears to be doing well with regard to his wound. He is making good progress and overall very pleased with where things stand today. There is no signs of active infection at this time which is great news. 12/28/2020 upon evaluation today patient appears to be doing well with regard to his wounds. He has been tolerating the dressing changes without complication. Fortunately there is no signs of active infection at this time. No fevers, chills, nausea, vomiting, or diarrhea. 12/28/2020 upon evaluation today  patient's wound bed actually showed signs of excellent improvement. He has great epithelization and granulation I do not see any signs of infection overall I am extremely pleased with where things stand at this point. No fevers, chills, nausea, vomiting, or diarrhea. 01/11/2021 upon evaluation today patient appears to be doing well with regard to his wound on his leg. He has been tolerating the dressing changes without complication. Fortunately there does not appear to be any signs of active infection which is great news. No fevers, chills, nausea, vomiting, or diarrhea. 01/25/2021 upon evaluation today patient appears to be doing well with regard to his wound. He has been tolerating the dressing changes without complication. Fortunately the collagen seems to be doing a great job which is excellent news. No fevers, chills, nausea, vomiting, or diarrhea. 02/08/2021 upon evaluation today patient's wound is actually looking a little bit worse especially in the periwound compared to previous. Fortunately there does not appear to be any signs of infection which is great news with that being said he does have some irritation around the periphery of  the wound which has me more concerned. He actually had a dressing on that had not been changed in 3 days. He also is supposed to have daily dressing changes. With regard to the dressing applied he had a silver alginate dressing and silver collagen is what is recommended and ordered. He also had no Desitin around the edges of the wound in the periwound region although that is on the order inspect to be done as well. In general I was very concerned I did contact [facility] actually spoke with Verlon Au who is the scheduling individual and subsequently she stated that she would pass the information to the D ON apparently the D ON was not available to talk to me when I call today. 02/18/2021 upon evaluation today patient's wound is actually showing signs of  improvement. Fortunately there does not appear to be any evidence of infection which is great news overall I am extremely pleased with where things stand today. No fevers, chills, nausea, vomiting, or diarrhea. 8/3; patient presents for 1 week follow-up. He has no issues or complaints today. He denies signs of infection. 03/11/2021 upon evaluation today patient appears to be doing well with regard to his wound. He does have a little bit of slough noted on the surface of the wound but fortunately there does not appear to be any signs of active infection at this time. No fevers, chills, nausea, vomiting, or diarrhea. 03/18/2021 upon evaluation today patient appears to be doing well with regard to his wound. He has been tolerating the dressing changes without complication. There was a little irritation more proximal to where the wound was that was not noted last week but nonetheless this is very superficial just seems to be more irritation we just need to make sure to put a good amount of the zinc over the area in my opinion. Otherwise he does not seem to be doing significantly worse at all which is great news. 03/25/2021 upon evaluation today patient appears to be doing well with regard to his wound. He is going require some sharp debridement today to clear with some of the necrotic debris. I did perform this today without complication postdebridement wound bed appears to be doing much better this is great news. 04/08/2021 upon evaluation today patient appears to be doing decently well in regard to his wound although the overall measurement is not significantly smaller compared to previous. It is gone down a little bit but still the facility continues to not really put the appropriate dressings in place in fact he was supposed to have collagen we think he probably had more of an allergy to At this point. Fortunately there does not appear to be any signs of active infection systemically though locally I do  not see anything on initial visualization either as far as erythema or warmth. 04/15/2021 upon evaluation today patient appears to be doing well with regard to his wound. He is actually showing signs of improvement. I did place him on antibiotics last week, Cipro. He has been taking that 2 times a day and seems to be tolerating it very well. I do not see any evidence of worsening and in fact the overall appearance of the wound is smaller today which is also great news. 9/26; left medial ankle chronic venous insufficiency wound is improved. Using Hydrofera Blue 10/10; left medial ankle chronic venous insufficiency. Wound has not changed much in appearance completely nonviable surface. Apparently there have been problems getting the right product on the wound  at the facility although he came in with [facility] on today 05/14/2021 upon evaluation today patient appears to be doing well with regard to his wound. I think he is making progress here which is good news. Fortunately there does not appear to be any signs of active infection at this time. No fevers, chills, nausea, vomiting, or diarrhea. 05/20/2021 upon evaluation today patient appears to be doing well with regard to his wound. He is showing signs of good improvement which is great news. There does not appear to be any evidence of active infection which is also excellent news. No fevers, chills, nausea, vomiting, or diarrhea. 05/28/2021 upon evaluation today patient appears to be doing quite well. There does not appear to be any signs of active infection at this time which is great news. Overall I am extremely pleased with where things stand today. I think he is headed in the right direction. ZEPPELIN, COMMISSO ([id number]) [id number] Page 3 of 13 06/11/2021 upon evaluation today patient appears to be doing well with regard to his left ankle ulcer and poorly in regard to the toe ulcer on the second toe right foot.  This appears to show signs of joint exposure. Apparently this has been present for 1 to 2 months although he kept forgetting to tell me about it. That is unfortunate as right now it definitely appears to be doing significantly worse than what I would like to see. There does not appear to be any signs of active infection systemically though locally I am concerned about the possibility of infection the toe is quite red. Again no one from the facility ever contacted [place] to advise that this was going on in the interim either. 06/17/2021 upon evaluation today patient presents for follow-up I did review his x-ray which showed a navicular bone fracture I am unsure of the chronicity of this. Subsequently he also had osteomyelitis of the toe which was what I was more concerned about this did not show up on x-ray but did show up on the pathology scrapings. This was listed as acute osteomyelitis. Nonetheless at this point I think that the antibiotic treatment is the best regimen to go with currently. The patient is in agreement with that plan. Nonetheless he has initially 30 days of doxycycline off likely extend that towards the end of the treatment cycle that will be around the middle of December for an additional 2 weeks. That all depends on how well he continues to heal. Nonetheless based on what I am seeing in the foot I did want a proceed with an MRI as well which I think will be helpful to identify if there is anything else that needs to be addressed from the standpoint of infection. 06/24/2021 upon evaluation today patient appears to be doing pretty well in regards to his wounds. I think both are actually showing signs of improvement which is good I did review his MRI today which did show signs of osteomyelitis of the middle and proximal phalanx on his right foot of the affected toe. With that being said this is actually showing signs of significant improvement today already with the antibiotic therapy I  think the redness is also improved. Overall I think that we just need to give this some time with appropriate wound care we will see how things go potentially hyperbarics could be considered. 07/02/2021 upon inspection today patient actually appears to be doing well in regard to his left ankle which is getting very close to complete resolution of  pleased in that regard. Unfortunately he is continuing to have issues with his second toe right foot and this seems to still be very painful for him. Recommend he try something different from the standpoint of antibiotics. 07/15/2021 upon evaluation today patient appears to be doing actually pretty well in regard to his foot. This is actually showing signs of significant improvement which is great news. Overall I feel like the patient is improving both in regard to the second toe as well as the ankle on the left. With that being said the biggest issue that I do see currently is that he is needing to have a refill of the doxycycline that we previously treated him with. He also did see podiatry they are not going to recommend any amputation at this point since he seems to be doing quite well. For that reason we just need to keep things under control from an infection standpoint. 08/01/2021 upon evaluation today patient appears to be doing well with regard to his wound. He has been tolerating the dressing changes without complication. Fortunately there does not appear to be any evidence of active infection locally nor systemically at this point. In fact I think everything is doing excellent in fact his second toe on the right foot is almost healed and the ankle on the left ankle region is actually very close to being healed as well. 08/08/2021 upon evaluation today patient appears to be doing well with regard to his wound. He has been tolerating the dressing changes without complication. Fortunately I do not see any signs of active infection at this  time. Readmission: 12-06-2021 upon evaluation today patient presents for reevaluation here in the clinic he does tell me that he was being seen in facility at [facility] by a provider that was coming in. He is not sure who this was. He tells me however that the wound seems to have gotten worse even compared to where it was when we last saw him at this point. With that being said I do believe that he is likely going need ongoing wound care here in the clinic and I do believe that we need to be the ones to frontline this since his wound does seem to be getting worse not better at this point. He voiced understanding. He is also in agreement with this plan and feels more comfortable coming here she tells me. Patient's medical history really has not changed since his prior admission he was only gone since January. 12-27-2021 upon evaluation today patient appears to be doing well with regard to his wound they did run out of the [facility] so they did not put anything on just an ABD pad with gentamicin. Still we are seeing some signs of good improvement here with some new epithelization which is great news. 01-10-2022 upon evaluation today patient appears to be doing well with regard to his wounds and he is going require some sharp debridement but overall seems to be making good progress. Fortunately I do not see any evidence of active infection locally or systemically at this time which is great news. 01-24-2022 upon evaluation today patient appears to be doing well with regard to his wound. The facility actually came and dropped him off early and he had another appointment at the hospital and then they just brought him over here and this was still hours before his appointment this afternoon. For that reason we did do our best to work him in this morning and fortunately had some  space to make this happen. With that being said patient's wound does seem to be making progress here and I am very pleased  in that regard I do not see any signs of active infection locally or systemically at this time. 02-07-2022 patient appears to be doing well currently in regard to his wounds. In fact one of them the more proximal is healed the distal is still open but seems to be doing excellent. Fortunately I do not see any evidence of active infection locally or systemically at this time which is great news. No fevers, chills, nausea, vomiting, or diarrhea. 7/27; left medial ankle venous. Improving per our intake nurse. We are using Hydrofera Blue under Tubigrip compression. They are changing that at his facility 03-06-2022 upon evaluation today patient appears to be doing well with regard to the his wound he is can require some sharp debridement but seems to be making excellent progress. Fortunately I do not see any evidence of active infection locally or systemically which is great news. 03-27-2022 upon evaluation today patient's wound is actually showing signs of excellent improvement. Fortunately I see no signs of active infection locally or systemically at this time which is great news. No fevers, chills, nausea, vomiting, or diarrhea. 04-21-2022 upon evaluation today patient appears to be doing excellent in regard to his wound in fact this is very close to resolution based on what I am seeing. I do not see any evidence of active infection locally or systemically at this time which is great news and overall I am extremely pleased with where we are today. 05-05-2022 upon evaluation today patient's wound actually appears to potentially be completely healed. Fortunately I do not see any evidence of active infection at this time which is great news and overall I am very pleased I think this needs a little time to toughen up but other than that I really do believe were doing quite well. He is very pleased to hear this its been a long time coming. 05-19-2022 upon evaluation today patient appears to be doing well currently  in regard to his wound. He in fact we were hoping will be completely healed and closed but it still has a very tiny area right in the center which is still continuing to drain. Fortunately I do not see any signs of infection locally or systemically which is great news. 11/6; this wound is close to completely" he comes in this week with some erythema at 1 edge of this which looks like something was rubbing on the area. Other than that no evidence of infection 06-16-2022 upon evaluation today patient appears to be doing well currently in regard to his ankle ulcer. This is very close to being healed but still has a small area in the central portion which is not. Fortunately there does not appear to be any signs of infection locally or systemically which is great news. No fevers, chills, nausea, vomiting, or diarrhea. 06-30-2022 upon evaluation patient's wound pretty much appears to be almost completely closed. In fact it may even be closed but there are still a lot of skin irritation around the edges of the wound and I just do not feel great about releasing them yet I would like to continue to monitor this just a little bit longer before getting to that point. He is not opposed to this and in fact is happy to continue to come in if need be in order to make sure that things are moving in the right [facility],  [name] ([id number]) [id number] Page 4 of 13 direction he definitely does not want to backtrack. 07-14-2022 upon evaluation today patient appears to be doing well currently in regard to his wound. Has been tolerating the dressing changes without complication. Fortunately I see no evidence of active infection locally nor systemically which is great news. No fevers, chills, nausea, vomiting, or diarrhea. 08-11-2022 upon evaluation today patient appears to be doing a little worse than last time I saw him although it has been a month. Unfortunately he was not able to be seen due  to the fact that he was quarantined in the facility with COVID. Subsequently he tells me that he mention to the facility staff several times about taking it off over the past several weeks but they continue to tell him that someone have to get in touch with [place] here although nobody ever did until Friday when the nurse manager called to have that documented in the communication notes. With that being said unfortunately this means that he had a wrap on that he was supposed to have on for only 1 week for ended up being on four 1 month essentially. I am glad that there is nothing worse than what we see currently to be perfectly honest. 08-19-2022 upon evaluation today patient appears to be doing well currently in regard to his wounds. He has been tolerating the dressing changes without complication. This is actually smaller today which is great news after last week's reopening. Fortunately I do not see any evidence of infection locally nor systemically at this point. 08-26-2022 upon evaluation today patient appears to be doing well currently in regard to his wound. He has been tolerating the dressing changes without complication. Fortunately there does not appear to be any signs of active infection locally nor systemically which is great news. No fevers, chills, nausea, vomiting, or diarrhea. 09-02-2022 upon evaluation today patient's wound actually showing signs of excellent improvement this is measuring smaller and looking much better. I am very pleased with where we stand I do believe that we are headed in the right direction. 09-09-2022 upon evaluation today patient appears to be doing decently well in regard to his leg in general although there was some swelling this is the 1 thing that I am not too happy with based on what I see currently. Fortunately there does not appear to be any signs of infection locally nor systemically at this point. 09-16-2022 upon evaluation today patient appears to be doing well  currently in regard to his wound. He is actually showing signs of improvement he wore the Tubigrip this week and it significantly better compared to last week's evaluation. Fortunately I do not see any evidence of active infection locally or systemically which is great news. Overall I think that were headed in the right direction which is great news. In fact overall I think this is the best that he has looked in some time. He does not like the Tubigrip but actually did extremely well with this in my opinion. I think he needs to continue to utilize this. 09-23-2022 upon evaluation today patient appears to be doing well currently in regard to his wound. He has been tolerating the dressing changes without complication. Fortunately there does not appear to be any signs of active infection locally nor systemically which is great news and in general I do feel like that we are headed in the right direction. He has been having trouble with the Tubigrip however we have gotten the facility to  order compression socks he has not gotten them as of yet. 3/4; patient is about the same with a wound on the left posterior leg. This is a venous wound. He needs more compression on this leg but he took off the previous wraps we are using. I am not certain whether he is using juxta lite stockings at the nursing home. 3/12; patient presents for follow-up. He unfortunately has 2 new wounds 1 to the dorsal left side and the other to the right heel. He is not sure how these happened. The right heel appears to be caused by pressure. He has been using Hydrofera Blue to the original wound on the left leg. 10-14-2022 upon evaluation today patient appears to be doing well currently in regard to his original wound but unfortunately he has several new wounds which I was completely unaware of before walking into the room to see him today. Unfortunately he seems to not be doing nearly as well as what I saw when I last had an appointment  with him 3 weeks ago. Fortunately I do not see any signs of systemic infection though unfortunately locally he has definite infection of the left lower extremity. He is currently on doxycycline I am going to likely have to extend that today. 10-21-2022 upon evaluation today patient appears to be doing well currently in regard to his wounds. He seems to be tolerating the dressing changes without complication. Fortunately there does not appear to be any signs of active infection locally or systemically which is great news and in general I definitely feel like we are on the right track here. 10-30-2022 upon evaluation today patient still continues to not be doing too well at all with regard to his wounds. Unfortunately he is still having signs of infection as well which also has been concerned. Fortunately I do not see any signs of active infection locally nor systemically at this time. No fevers, chills, nausea, vomiting, or diarrhea. 11-06-2022 upon evaluation today patient appears to be doing okay currently in regard to his wounds. All things considered with his blood flow not being nearly as good as what should I think that he is making pretty good progress here. Fortunately I do not see any signs of infection locally nor systemically which is great news. No fevers, chills, nausea, vomiting, or diarrhea. 11-13-2022 upon evaluation today patient appears to be doing poorly currently in regard to his wounds. Things seem to be getting a little bit worse not better. 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 Subsequently he is also going to need to have vascular intervention but we are unsure exactly when this is going on the undertaking. I would try to actually send a message to Dr. Hazle Quant and the vascular team just to see what exactly the plan is going forward. The patient is in agreement with that plan I just  think we need to have a plan especially in light of the fact the left leg seems to be getting a little bit worse at this point. 11-25-2022 upon evaluation today patient is currently now discharged from the hospital following his colostomy procedure. That did very well and he seems to be doing well. Fortunately there does not appear to be any signs of active infection locally or systemically which is great news. No fevers, chills, nausea, vomiting, or diarrhea. Electronic Signature(s) Signed: 11/25/2022 9:31:33 AM By: [name]  PA-C Entered By: [name] on 11/25/2022 09:31:32 -------------------------------------------------------------------------------- Physical Exam Details Patient Name: Date of Service: Chad [name], Chad [name] 11/25/2022 8:30 A M Medical Record Number: [id number] Patient Account Number: [id number] Date of Birth/Sex: Treating RN: [birth year] (62 y.o. Chad [name] Primary Care Provider: [name] Other Clinician: Referring Provider: Treating Provider/Extender: [name] Weeks in Treatment: [address], [place] ([id number]) [id number] Page 5 of 13 Constitutional Well-nourished and well-hydrated in no acute distress. Respiratory normal breathing without difficulty. Psychiatric this patient is able to make decisions and demonstrates good insight into disease process. Alert and Oriented x 3. pleasant and cooperative. Notes Upon inspection his wounds are stable. He stills does need the vascular procedures to open up blood flow and that something that is on hold at the moment following his surgery until he is released for any additional intervention. With that being said he needs this as soon as possible in my opinion. Electronic Signature(s) Signed: 11/25/2022 9:31:50 AM By: [name] PA-C Entered By: [name] on 11/25/2022 09:31:49 -------------------------------------------------------------------------------- Physician  Orders Details Patient Name: Date of Service: Chad [name], Chad [name] 11/25/2022 8:30 A M Medical Record Number: [id number] Patient Account Number: [id number] Date of Birth/Sex: Treating RN: [birth year] (62 y.o. 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 Bathing/ Shower/ Hygiene May shower with wound dressing protected with water repellent cover or cast protector. No tub bath. Anesthetic (Use 'Patient Medications' Section for Anesthetic Order Entry) Lidocaine applied to wound bed Edema Control - Lymphedema / Segmental Compressive Device / Other Elevate legs to the level of the heart and pump ankles as often as possible Elevate leg(s) parallel to the floor when sitting. Off-Loading Heel suspension boot - when in bed Medications-Please add to medication list. Other: - apply AandD ointment to leg, do not apply to wound Wound Treatment Wound #12 - Ankle Wound Laterality: Left, Medial, Distal Cleanser: Soap and Water Every Other Day/30 Days Discharge Instructions: Gently cleanse wound with antibacterial soap, rinse and pat dry prior to dressing wounds Cleanser: Wound Cleanser Every Other Day/30 Days Discharge Instructions: Wash your hands with soap and water. Remove old dressing, discard into plastic bag and place into trash. Cleanse the wound with Wound  Cleanser prior to applying a clean dressing using gauze sponges, not tissues or cotton balls. Do not scrub or use excessive force. Pat dry using gauze sponges, not tissue or cotton balls. Prim Dressing: Silvercel Small 2x2 (in/in) Every Other Day/30 Days ary Discharge Instructions: Apply Silvercel Small 2x2 (in/in) as instructed Secondary Dressing: T Adhesive Toll Brothers, 4x4 (in/in) elfa Every Other Day/30 Days Chad [name], Chad [name] ([id number]) [id number] Page 6 of 13 Discharge Instructions: Apply over dressing to secure in place. Wound #15 - Foot Wound Laterality: Dorsal, Left Cleanser: Soap and Water Every Other Day/30 Days Discharge Instructions: Gently cleanse wound with antibacterial soap, rinse and pat dry prior to dressing wounds Cleanser: Wound Cleanser Every Other Day/30 Days Discharge Instructions: Wash your hands with soap and water. Remove old dressing, discard into  plastic bag and place into trash. Cleanse the wound with Wound Cleanser prior to applying a clean dressing using gauze sponges, not tissues or cotton balls. Do not scrub or use excessive force. Pat dry using gauze sponges, not tissue or cotton balls. Prim Dressing: Silvercel Small 2x2 (in/in) Every Other Day/30 Days ary Discharge Instructions: Apply Silvercel Small 2x2 (in/in) as instructed Secondary Dressing: T Adhesive Toll [employer], 4x4 (in/in) Every Other Day/30 Days elfa Discharge Instructions: Apply over dressing to secure in place. Wound #16 - Calcaneus Wound Laterality: Right Cleanser: Soap and Water Every Other Day/30 Days Discharge Instructions: Gently cleanse wound with antibacterial soap, rinse and pat dry prior to dressing wounds Cleanser: Wound Cleanser Every Other Day/30 Days Discharge Instructions: Wash your hands with soap and water. Remove old dressing, discard into plastic bag and place into trash. Cleanse the wound with Wound Cleanser prior to applying a clean  dressing using gauze sponges, not tissues or cotton balls. Do not scrub or use excessive force. Pat dry using gauze sponges, not tissue or cotton balls. Prim Dressing: Silvercel Small 2x2 (in/in) Every Other Day/30 Days ary Discharge Instructions: Apply Silvercel Small 2x2 (in/in) as instructed Secondary Dressing: T Adhesive Toll [employer], 4x4 (in/in) Every Other Day/30 Days elfa Discharge Instructions: Apply over dressing to secure in place. Wound #17 - Lower Leg Wound Laterality: Left, Anterior Cleanser: Soap and Water Every Other Day/30 Days Discharge Instructions: Gently cleanse wound with antibacterial soap, rinse and pat dry prior to dressing wounds Cleanser: Wound Cleanser Every Other Day/30 Days Discharge Instructions: Wash your hands with soap and water. Remove old dressing, discard into plastic bag and place into trash. Cleanse the wound with Wound Cleanser prior to applying a clean dressing using gauze sponges, not tissues or cotton balls. Do not scrub or use excessive force. Pat dry using gauze sponges, not tissue or cotton balls. Prim Dressing: Silvercel Small 2x2 (in/in) Every Other Day/30 Days ary Discharge Instructions: Apply Silvercel Small 2x2 (in/in) as instructed Secondary Dressing: T Adhesive Toll [employer], 4x4 (in/in) Every Other Day/30 Days elfa Discharge Instructions: Apply over dressing to secure in place. Wound #18 - T Second oe Wound Laterality: Right, Medial Cleanser: Soap and Water Every Other Day/30 Days Discharge Instructions: Gently cleanse wound with antibacterial soap, rinse and pat dry prior to dressing wounds Cleanser: Wound Cleanser Every Other Day/30 Days Discharge Instructions: Wash your hands with soap and water. Remove old dressing, discard into plastic bag and place into trash. Cleanse the wound with Wound Cleanser prior to applying a clean dressing using gauze sponges, not tissues or cotton balls. Do not scrub or use excessive force. Pat dry  using gauze sponges, not tissue or cotton balls. Prim Dressing: Silvercel Small 2x2 (in/in) Every Other Day/30 Days ary Discharge Instructions: Apply Silvercel Small 2x2 (in/in) as instructed Secondary Dressing: T Adhesive Toll [employer], 4x4 (in/in) Every Other Day/30 Days elfa Discharge Instructions: Apply over dressing to secure in place. Wound #19 - Lower Leg Wound Laterality: Left, Posterior Cleanser: Soap and Water Every Other Day/30 Days Discharge Instructions: Gently cleanse wound with antibacterial soap, rinse and pat dry prior to dressing wounds Cleanser: Wound Cleanser Every Other Day/30 Days Discharge Instructions: Wash your hands with soap and water. Remove old dressing, discard into plastic bag and place into trash. Cleanse the wound with Wound Cleanser prior to applying a clean dressing using gauze sponges, not tissues or cotton balls. Do not scrub or use excessive force. Pat dry using gauze sponges, not tissue or cotton balls.  Prim Dressing: Silvercel Small 2x2 (in/in) Every Other Day/30 Days ary Discharge Instructions: Apply Silvercel Small 2x2 (in/in) as instructed Secondary Dressing: T Adhesive Toll [employer], 4x4 (in/in) Every Other Day/30 Days elfa Discharge Instructions: Apply over dressing to secure in place. Chad [name], Chad [name] ([id number]) [id number] Page 7 of 13 Electronic Signature(s) Signed: 11/25/2022 4:35:08 PM By: Midge Aver MSN RN CNS WTA Signed: 11/25/2022 6:18:39 PM By: [name] PA-C Previous Signature: 11/25/2022 9:11:03 AM Version By: Midge Aver MSN RN CNS WTA Entered By: Midge Aver on 11/25/2022 09:32:23 -------------------------------------------------------------------------------- Problem List Details Patient Name: Date of Service: Chad [name], Chad [name] 11/25/2022 8:30 A M Medical Record Number: [id number] Patient Account Number: [id number] Date of Birth/Sex: Treating RN: 16-Dec-1960 (62 y.o. 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 affecting left non- 12/06/2021 No Yes dominant side 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, stage 3 10/07/2022 No Yes Inactive Problems Resolved Problems Electronic Signature(s) Signed: 11/25/2022 4:35:08 PM By: Midge Aver MSN RN CNS WTA Signed: 11/25/2022 6:18:39 PM By: [name] PA-C Previous Signature: 11/25/2022 8:52:28 AM Version By: [name] PA-C Entered By: Midge Aver on 11/25/2022 09:33:43 -------------------------------------------------------------------------------- Progress Note Details Patient Name: Date of Service: Chad [name] 11/25/2022 8:30 A M Medical Record Number: [id number] Patient Account Number: [id number] Date of Birth/Sex: Treating RN: [birth year] (62 y.o. Chad [name] Primary Care Provider: [name] Other Clinician: Referring Provider: Treating Provider/Extender: [name] Weeks in Treatment: [address] Subjective Chief Complaint SAHITH, NURSE ([id number]) [id number] Page 8 of 13 Information obtained from Patient Left ankle ulcer, Left dorsal foot wound, right heel History of Present Illness (HPI) 10/08/18 on evaluation today patient actually presents to our office for initial evaluation concerning wounds that he has of the bilateral lower extremities. He has no history of known diabetes, he does have hepatitis C, urinary tract  cancer for which she receives infusions not chemotherapy, and the history of the left- sided stroke with residual weakness. He also has bilateral venous stasis. He apparently has been homeless currently following discharge from the hospital apparently he has been placed at [facility] which is is a skilled nursing facility locally. Nonetheless fortunately he does not show any signs of infection at this time which is good news. In fact several of the wound actually appears to be showing some signs of improvement already in my pinion. There are a couple areas in the left leg in particular there likely gonna require some sharp debridement to help clear away some necrotic tissue and help with more sufficient healing. No fevers, chills, nausea, or vomiting noted at this time. 10/15/18 on evaluation today patient actually appears to be doing very well in regard to his bilateral lower extremities. He's been tolerating the dressing changes without complication. Fortunately there does not appear to be any evidence of active infection at this time which is great news. Overall I'm actually very pleased with how this has progressed in just one visits time. Readmission: 08/14/2020 upon evaluation today patient presents for re-evaluation here in our clinic. He is having issues with his  left ankle region as well as his right toe and his right heel. He tells me that the toe and heel actually began as a area that was itching that he was scratching and then subsequently opened up into wounds. These may have been abscess areas I presume based on what I am seeing currently. With regard to his left ankle region he tells me this was a similar type occurrence although he does have venous stasis this very well may be more of a venous leg ulcer more than anything. Nonetheless I do believe that the patient would benefit from appropriate and aggressive wound care to try to help get things under better control here. He does  have history of a stroke on the left side affecting him to some degree there that he is able to stand although he does have some residual weakness. Otherwise again the patient does have chronic venous insufficiency as previously noted. His arterial studies most recently obtained showed that he had an ABI on the right of 1.16 with a TBI of 0.52 and on the left and ABI of 1.14 with a TBI of 0.81. That was obtained on 06/19/2020. 08/28/2020 upon evaluation today patient appears to be doing decently well in regard to his wounds in general. He has been tolerating the dressing changes without complication. Fortunately there does not appear to be any signs of active infection which is great news. With that being said I think the [facility] is doing a good job I would recommend that we likely continue with that currently. 09/11/2020 upon evaluation today patient's wounds did not appear to be doing too poorly but again he is not really showing signs of significant improvement with regard to any of the wounds on the right. None of them have Hydrofera Blue on them I am not exactly sure why this is not being followed as the facility did not contact us to let us know of any issues with obtaining dressings or otherwise. With that being said he is supposed to be using Hydrofera Blue on both of the wounds on the right foot as well as the ankle wound on the left side. 09/18/2020 upon evaluation today patient appears to be doing poorly with regard to his wounds. Again right now the left ankle in particular showed signs of extreme maceration. Apparently he was told by someone with staff at [facility] they could not get the [facility]. With that being said this is something that is never been relayed to [place] one way or another. Also the patient subsequently has not supposed to have a border gauze dressing on. He should have an ABD pad and roll gauze to secure as this drains much too much just to have a border  gauze dressing to cover. Nonetheless the fact that they are not using the appropriate dressing is directly causing deterioration of the left ankle wound it is significantly worse today compared to what it was previous. I did attempt to call [facility] while the patient was here I called three times and got no one to even pick up the phone. After this I had my for an office coordinator call and she was able to finally get through and leave a message with the D ON as of dictation of this note which is roughly about an hour and a half later I still have not been able to speak with anyone at the facility. 09/25/2020 upon evaluation today patient actually showing signs of good improvement which is  excellent news. He has been tolerating the dressing changes without complication. Fortunately there is no signs of active infection which is great news. No fevers, chills, nausea, vomiting, or diarrhea. I do feel like the facility has been doing a much better job at taking care of him as far as the dressings are concerned. However the director of nursing never did call me back. 10/09/2020 upon evaluation today patient appears to be doing well with regard to his wound. The toe ulcer did require some debridement but the other 2 areas actually appear to be doing quite well. 10/19/2020 upon evaluation today patient actually appears to be doing very well in regard to his wounds. In fact the heel does appear to be completely healed. The toe is doing better in the medial ankle on the left is also doing better. Overall I think he is headed in the right direction. 10/26/2020 upon evaluation today patient appears to be doing well with regard to his wound. He is showing signs of improvement which is great news and overall I am very pleased with where things stand today. No fevers, chills, nausea, vomiting, or diarrhea. 11/02/2020 upon evaluation today patient appears to be doing well with regard to his wounds. He has been  tolerating the dressing changes without complication overall I am extremely pleased with where things stand today. He in regard to the toe is almost completely healed and the medial ankle on the left is doing much better. 11/09/2020 upon evaluation today patient appears to be doing a little poorly in regard to his left medial ankle ulcer. Fortunately there does not appear to be any signs of systemic infection but unfortunately locally he does appear to be infected in fact he has blue-green drainage consistent with Pseudomonas. 11/16/2020 upon evaluation today patient appears to be doing well with regard to his wound. It actually appears to be doing better. I did place him on gentamicin cream since the Cipro was actually resistant even though he was positive for Pseudomonas on culture. Overall I think that he does seem to be doing better though I am unsure whether or not they have actually been putting the cream on. The patient is not sure that we did talk to the nurse directly and she was going to initiate that treatment. Fortunately there does not appear to be any signs of active infection at this time. No fevers, chills, nausea, vomiting, or diarrhea. 4/28; the area on the right second toe is close to healed. Left medial ankle required debridement 12/07/2020 upon evaluation today patient appears to be doing well with regard to his wounds. In fact the right second toe appears to be completely healed which is great news. Fortunately there does not appear to be any signs of active infection at this time which is also great news. I think we can probably discontinue the gentamicin on top of everything else. 12/14/2020 upon evaluation today patient appears to be doing well with regard to his wound. He is making good progress and overall very pleased with where things stand today. There is no signs of active infection at this time which is great news. 12/28/2020 upon evaluation today patient appears to be doing  well with regard to his wounds. He has been tolerating the dressing changes without complication. Fortunately there is no signs of active infection at this time. No fevers, chills, nausea, vomiting, or diarrhea. 12/28/2020 upon evaluation today patient's wound bed actually showed signs of excellent improvement. He has great epithelization and granulation I  do not see any signs of infection overall I am extremely pleased with where things stand at this point. No fevers, chills, nausea, vomiting, or diarrhea. 01/11/2021 upon evaluation today patient appears to be doing well with regard to his wound on his leg. He has been tolerating the dressing changes without complication. Fortunately there does not appear to be any signs of active infection which is great news. No fevers, chills, nausea, vomiting, or diarrhea. 01/25/2021 upon evaluation today patient appears to be doing well with regard to his wound. He has been tolerating the dressing changes without complication. Fortunately the collagen seems to be doing a great job which is excellent news. No fevers, chills, nausea, vomiting, or diarrhea. Chad [name], Chad [name] ([id number]) [id number] Page 9 of 13 02/08/2021 upon evaluation today patient's wound is actually looking a little bit worse especially in the periwound compared to previous. Fortunately there does not appear to be any signs of infection which is great news with that being said he does have some irritation around the periphery of the wound which has me more concerned. He actually had a dressing on that had not been changed in 3 days. He also is supposed to have daily dressing changes. With regard to the dressing applied he had a silver alginate dressing and silver collagen is what is recommended and ordered. He also had no Desitin around the edges of the wound in the periwound region although that is on the order inspect to be done as well. In general I was very concerned I  did contact [facility] actually spoke with Verlon Au who is the scheduling individual and subsequently she stated that she would pass the information to the D ON apparently the D ON was not available to talk to me when I call today. 02/18/2021 upon evaluation today patient's wound is actually showing signs of improvement. Fortunately there does not appear to be any evidence of infection which is great news overall I am extremely pleased with where things stand today. No fevers, chills, nausea, vomiting, or diarrhea. 8/3; patient presents for 1 week follow-up. He has no issues or complaints today. He denies signs of infection. 03/11/2021 upon evaluation today patient appears to be doing well with regard to his wound. He does have a little bit of slough noted on the surface of the wound but fortunately there does not appear to be any signs of active infection at this time. No fevers, chills, nausea, vomiting, or diarrhea. 03/18/2021 upon evaluation today patient appears to be doing well with regard to his wound. He has been tolerating the dressing changes without complication. There was a little irritation more proximal to where the wound was that was not noted last week but nonetheless this is very superficial just seems to be more irritation we just need to make sure to put a good amount of the zinc over the area in my opinion. Otherwise he does not seem to be doing significantly worse at all which is great news. 03/25/2021 upon evaluation today patient appears to be doing well with regard to his wound. He is going require some sharp debridement today to clear with some of the necrotic debris. I did perform this today without complication postdebridement wound bed appears to be doing much better this is great news. 04/08/2021 upon evaluation today patient appears to be doing decently well in regard to his wound although the overall measurement is not significantly smaller compared to previous. It is  gone down a little bit but still the  facility continues to not really put the appropriate dressings in place in fact he was supposed to have collagen we think he probably had more of an allergy to At this point. Fortunately there does not appear to be any signs of active infection systemically though locally I do not see anything on initial visualization either as far as erythema or warmth. 04/15/2021 upon evaluation today patient appears to be doing well with regard to his wound. He is actually showing signs of improvement. I did place him on antibiotics last week, Cipro. He has been taking that 2 times a day and seems to be tolerating it very well. I do not see any evidence of worsening and in fact the overall appearance of the wound is smaller today which is also great news. 9/26; left medial ankle chronic venous insufficiency wound is improved. Using Hydrofera Blue 10/10; left medial ankle chronic venous insufficiency. Wound has not changed much in appearance completely nonviable surface. Apparently there have been problems getting the right product on the wound at the facility although he came in with [facility] on today 05/14/2021 upon evaluation today patient appears to be doing well with regard to his wound. I think he is making progress here which is good news. Fortunately there does not appear to be any signs of active infection at this time. No fevers, chills, nausea, vomiting, or diarrhea. 05/20/2021 upon evaluation today patient appears to be doing well with regard to his wound. He is showing signs of good improvement which is great news. There does not appear to be any evidence of active infection which is also excellent news. No fevers, chills, nausea, vomiting, or diarrhea. 05/28/2021 upon evaluation today patient appears to be doing quite well. There does not appear to be any signs of active infection at this time which is great news. Overall I am extremely pleased with where things  stand today. I think he is headed in the right direction. 06/11/2021 upon evaluation today patient appears to be doing well with regard to his left ankle ulcer and poorly in regard to the toe ulcer on the second toe right foot. This appears to show signs of joint exposure. Apparently this has been present for 1 to 2 months although he kept forgetting to tell me about it. That is unfortunate as right now it definitely appears to be doing significantly worse than what I would like to see. There does not appear to be any signs of active infection systemically though locally I am concerned about the possibility of infection the toe is quite red. Again no one from the facility ever contacted [place] to advise that this was going on in the interim either. 06/17/2021 upon evaluation today patient presents for follow-up I did review his x-ray which showed a navicular bone fracture I am unsure of the chronicity of this. Subsequently he also had osteomyelitis of the toe which was what I was more concerned about this did not show up on x-ray but did show up on the pathology scrapings. This was listed as acute osteomyelitis. Nonetheless at this point I think that the antibiotic treatment is the best regimen to go with currently. The patient is in agreement with that plan. Nonetheless he has initially 30 days of doxycycline off likely extend that towards the end of the treatment cycle that will be around the middle of December for an additional 2 weeks. That all depends on how well he continues to heal. Nonetheless based on what I am seeing  in the foot I did want a proceed with an MRI as well which I think will be helpful to identify if there is anything else that needs to be addressed from the standpoint of infection. 06/24/2021 upon evaluation today patient appears to be doing pretty well in regards to his wounds. I think both are actually showing signs of improvement which is good I did review his MRI today which did  show signs of osteomyelitis of the middle and proximal phalanx on his right foot of the affected toe. With that being said this is actually showing signs of significant improvement today already with the antibiotic therapy I think the redness is also improved. Overall I think that we just need to give this some time with appropriate wound care we will see how things go potentially hyperbarics could be considered. 07/02/2021 upon inspection today patient actually appears to be doing well in regard to his left ankle which is getting very close to complete resolution of pleased in that regard. Unfortunately he is continuing to have issues with his second toe right foot and this seems to still be very painful for him. Recommend he try something different from the standpoint of antibiotics. 07/15/2021 upon evaluation today patient appears to be doing actually pretty well in regard to his foot. This is actually showing signs of significant improvement which is great news. Overall I feel like the patient is improving both in regard to the second toe as well as the ankle on the left. With that being said the biggest issue that I do see currently is that he is needing to have a refill of the doxycycline that we previously treated him with. He also did see podiatry they are not going to recommend any amputation at this point since he seems to be doing quite well. For that reason we just need to keep things under control from an infection standpoint. 08/01/2021 upon evaluation today patient appears to be doing well with regard to his wound. He has been tolerating the dressing changes without complication. Fortunately there does not appear to be any evidence of active infection locally nor systemically at this point. In fact I think everything is doing excellent in fact his second toe on the right foot is almost healed and the ankle on the left ankle region is actually very close to being healed as well. 08/08/2021  upon evaluation today patient appears to be doing well with regard to his wound. He has been tolerating the dressing changes without complication. Fortunately I do not see any signs of active infection at this time. Readmission: 12-06-2021 upon evaluation today patient presents for reevaluation here in the clinic he does tell me that he was being seen in facility at [facility] by a provider that was coming in. He is not sure who this was. He tells me however that the wound seems to have gotten worse even compared to where it was when we last saw him at this point. With that being said I do believe that he is likely going need ongoing wound care here in the clinic and I do believe that we need to be the ones to frontline this since his wound does seem to be getting worse not better at this point. He voiced understanding. He is also in agreement with this plan and feels more comfortable coming here she tells me. Patient's medical history really has not changed since his prior admission he was only gone since January. Chad [name], Chad [name] ([id number]) [id number]  Page 10 of 13 12-27-2021 upon evaluation today patient appears to be doing well with regard to his wound they did run out of the [facility] so they did not put anything on just an ABD pad with gentamicin. Still we are seeing some signs of good improvement here with some new epithelization which is great news. 01-10-2022 upon evaluation today patient appears to be doing well with regard to his wounds and he is going require some sharp debridement but overall seems to be making good progress. Fortunately I do not see any evidence of active infection locally or systemically at this time which is great news. 01-24-2022 upon evaluation today patient appears to be doing well with regard to his wound. The facility actually came and dropped him off early and he had another appointment at the hospital and then they just  brought him over here and this was still hours before his appointment this afternoon. For that reason we did do our best to work him in this morning and fortunately had some space to make this happen. With that being said patient's wound does seem to be making progress here and I am very pleased in that regard I do not see any signs of active infection locally or systemically at this time. 02-07-2022 patient appears to be doing well currently in regard to his wounds. In fact one of them the more proximal is healed the distal is still open but seems to be doing excellent. Fortunately I do not see any evidence of active infection locally or systemically at this time which is great news. No fevers, chills, nausea, vomiting, or diarrhea. 7/27; left medial ankle venous. Improving per our intake nurse. We are using Hydrofera Blue under Tubigrip compression. They are changing that at his facility 03-06-2022 upon evaluation today patient appears to be doing well with regard to the his wound he is can require some sharp debridement but seems to be making excellent progress. Fortunately I do not see any evidence of active infection locally or systemically which is great news. 03-27-2022 upon evaluation today patient's wound is actually showing signs of excellent improvement. Fortunately I see no signs of active infection locally or systemically at this time which is great news. No fevers, chills, nausea, vomiting, or diarrhea. 04-21-2022 upon evaluation today patient appears to be doing excellent in regard to his wound in fact this is very close to resolution based on what I am seeing. I do not see any evidence of active infection locally or systemically at this time which is great news and overall I am extremely pleased with where we are today. 05-05-2022 upon evaluation today patient's wound actually appears to potentially be completely healed. Fortunately I do not see any evidence of active infection at this time  which is great news and overall I am very pleased I think this needs a little time to toughen up but other than that I really do believe were doing quite well. He is very pleased to hear this its been a long time coming. 05-19-2022 upon evaluation today patient appears to be doing well currently in regard to his wound. He in fact we were hoping will be completely healed and closed but it still has a very tiny area right in the center which is still continuing to drain. Fortunately I do not see any signs of infection locally or systemically which is great news. 11/6; this wound is close to completely" he comes in this week with some erythema at 1  edge of this which looks like something was rubbing on the area. Other than that no evidence of infection 06-16-2022 upon evaluation today patient appears to be doing well currently in regard to his ankle ulcer. This is very close to being healed but still has a small area in the central portion which is not. Fortunately there does not appear to be any signs of infection locally or systemically which is great news. No fevers, chills, nausea, vomiting, or diarrhea. 06-30-2022 upon evaluation patient's wound pretty much appears to be almost completely closed. In fact it may even be closed but there are still a lot of skin irritation around the edges of the wound and I just do not feel great about releasing them yet I would like to continue to monitor this just a little bit longer before getting to that point. He is not opposed to this and in fact is happy to continue to come in if need be in order to make sure that things are moving in the right direction he definitely does not want to backtrack. 07-14-2022 upon evaluation today patient appears to be doing well currently in regard to his wound. Has been tolerating the dressing changes without complication. Fortunately I see no evidence of active infection locally nor systemically which is great news. No fevers,  chills, nausea, vomiting, or diarrhea. 08-11-2022 upon evaluation today patient appears to be doing a little worse than last time I saw him although it has been a month. Unfortunately he was not able to be seen due to the fact that he was quarantined in the facility with COVID. Subsequently he tells me that he mention to the facility staff several times about taking it off over the past several weeks but they continue to tell him that someone have to get in touch with [place] here although nobody ever did until Friday when the nurse manager called to have that documented in the communication notes. With that being said unfortunately this means that he had a wrap on that he was supposed to have on for only 1 week for ended up being on four 1 month essentially. I am glad that there is nothing worse than what we see currently to be perfectly honest. 08-19-2022 upon evaluation today patient appears to be doing well currently in regard to his wounds. He has been tolerating the dressing changes without complication. This is actually smaller today which is great news after last week's reopening. Fortunately I do not see any evidence of infection locally nor systemically at this point. 08-26-2022 upon evaluation today patient appears to be doing well currently in regard to his wound. He has been tolerating the dressing changes without complication. Fortunately there does not appear to be any signs of active infection locally nor systemically which is great news. No fevers, chills, nausea, vomiting, or diarrhea. 09-02-2022 upon evaluation today patient's wound actually showing signs of excellent improvement this is measuring smaller and looking much better. I am very pleased with where we stand I do believe that we are headed in the right direction. 09-09-2022 upon evaluation today patient appears to be doing decently well in regard to his leg in general although there was some swelling this is the 1 thing that I am not  too happy with based on what I see currently. Fortunately there does not appear to be any signs of infection locally nor systemically at this point. 09-16-2022 upon evaluation today patient appears to be doing well currently in regard to his  wound. He is actually showing signs of improvement he wore the Tubigrip this week and it significantly better compared to last week's evaluation. Fortunately I do not see any evidence of active infection locally or systemically which is great news. Overall I think that were headed in the right direction which is great news. In fact overall I think this is the best that he has looked in some time. He does not like the Tubigrip but actually did extremely well with this in my opinion. I think he needs to continue to utilize this. 09-23-2022 upon evaluation today patient appears to be doing well currently in regard to his wound. He has been tolerating the dressing changes without complication. Fortunately there does not appear to be any signs of active infection locally nor systemically which is great news and in general I do feel like that we are headed in the right direction. He has been having trouble with the Tubigrip however we have gotten the facility to order compression socks he has not gotten them as of yet. 3/4; patient is about the same with a wound on the left posterior leg. This is a venous wound. He needs more compression on this leg but he took off the previous wraps we are using. I am not certain whether he is using juxta lite stockings at the nursing home. 3/12; patient presents for follow-up. He unfortunately has 2 new wounds 1 to the dorsal left side and the other to the right heel. He is not sure how these happened. The right heel appears to be caused by pressure. He has been using Hydrofera Blue to the original wound on the left leg. 10-14-2022 upon evaluation today patient appears to be doing well currently in regard to his original wound but  unfortunately he has several new wounds which I was completely unaware of before walking into the room to see him today. Unfortunately he seems to not be doing nearly as well as what I saw when I last had Chad [name], Chad [name] ([id number]) [id number] Page 11 of 13 an appointment with him 3 weeks ago. Fortunately I do not see any signs of systemic infection though unfortunately locally he has definite infection of the left lower extremity. He is currently on doxycycline I am going to likely have to extend that today. 10-21-2022 upon evaluation today patient appears to be doing well currently in regard to his wounds. He seems to be tolerating the dressing changes without complication. Fortunately there does not appear to be any signs of active infection locally or systemically which is great news and in general I definitely feel like we are on the right track here. 10-30-2022 upon evaluation today patient still continues to not be doing too well at all with regard to his wounds. Unfortunately he is still having signs of infection as well which also has been concerned. Fortunately I do not see any signs of active infection locally nor systemically at this time. No fevers, chills, nausea, vomiting, or diarrhea. 11-06-2022 upon evaluation today patient appears to be doing okay currently in regard to his wounds. All things considered with his blood flow not being nearly as good as what should I think that he is making pretty good progress here. Fortunately I do not see any signs of infection locally nor systemically which is great news. No fevers, chills, nausea, vomiting, or diarrhea. 11-13-2022 upon evaluation today patient appears to be doing poorly currently in regard to his wounds. Things seem to be getting a little bit  worse not better. 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 Subsequently  he is also going to need to have vascular intervention but we are unsure exactly when this is going on the undertaking. I would try to actually send a message to Dr. Hazle Quant and the vascular team just to see what exactly the plan is going forward. The patient is in agreement with that plan I just think we need to have a plan especially in light of the fact the left leg seems to be getting a little bit worse at this point. 11-25-2022 upon evaluation today patient is currently now discharged from the hospital following his colostomy procedure. That did very well and he seems to be doing well. Fortunately there does not appear to be any signs of active infection locally or systemically which is great news. No fevers, chills, nausea, vomiting, or diarrhea. Objective Constitutional Well-nourished and well-hydrated in no acute distress. Vitals Time Taken: 8:46 AM, Height: 69 in, Weight: 150 lbs, BMI: 22.1, Temperature: 97.7 F, Pulse: 81 bpm, Respiratory Rate: 16 breaths/min, Blood Pressure: 113/76 mmHg. Respiratory normal breathing without difficulty. Psychiatric this patient is able to make decisions and demonstrates good insight into disease process. Alert and Oriented x 3. pleasant and cooperative. General Notes: Upon inspection his wounds are stable. He stills does need the vascular procedures to open up blood flow and that something that is on hold at the moment following his surgery until he is released for any additional intervention. With that being said he needs this as soon as possible in my opinion. Integumentary (Hair, Skin) Wound #12 status is Open. Original cause of wound was Gradually Appeared. The date acquired was: 07/12/2019. The wound has been in treatment 50 weeks. The wound is located on the Left,Distal,Medial Ankle. The wound measures 2.3cm length x 1cm width x 0.2cm depth; 1.806cm^2 area and 0.361cm^3 volume. There is Fat Layer (Subcutaneous Tissue) exposed. There is a  medium amount of serosanguineous drainage noted. The wound margin is distinct with the outline attached to the wound base. There is small (1-33%) pink granulation within the wound bed. There is a large (67-100%) amount of necrotic tissue within the wound bed including Adherent Slough. Wound #15 status is Open. Original cause of wound was Gradually Appeared. The date acquired was: 10/01/2022. The wound has been in treatment 7 weeks. The wound is located on the Left,Dorsal Foot. The wound measures 3.5cm length x 1cm width x 0.2cm depth; 2.749cm^2 area and 0.55cm^3 volume. There is Fat Layer (Subcutaneous Tissue) exposed. There is a medium amount of serosanguineous drainage noted. There is small (1-33%) red granulation within the wound bed. There is a large (67-100%) amount of necrotic tissue within the wound bed including Adherent Slough. Wound #16 status is Open. Original cause of wound was Gradually Appeared. The date acquired was: 09/26/2022. The wound has been in treatment 7 weeks. The wound is located on the Right Calcaneus. The wound measures 0.5cm length x 0.8cm width x 0.1cm depth; 0.314cm^2 area and 0.031cm^3 volume. There is Fat Layer (Subcutaneous Tissue) exposed. There is a medium amount of serosanguineous drainage noted. There is medium (34-66%) red granulation within the wound bed. There is a medium (34-66%) amount of necrotic tissue within the wound bed including Adherent Slough. Wound #17 status is Open. Original cause of wound was Pressure Injury. The date acquired was: 09/30/2022. The wound has been in treatment  6 weeks. The wound is located on the Left,Anterior Lower Leg. The wound measures 3.1cm length x 2cm width x 0.1cm depth; 4.869cm^2 area and 0.487cm^3 volume. There is Fat Layer (Subcutaneous Tissue) exposed. There is a medium amount of serosanguineous drainage noted. There is small (1-33%) red granulation within the wound bed. There is a large (67-100%) amount of necrotic tissue  within the wound bed including Eschar. Wound #18 status is Open. Original cause of wound was Pressure Injury. The date acquired was: 09/26/2022. The wound has been in treatment 6 weeks. The wound is located on the Right,Medial T Second. The wound measures 0.5cm length x 0.6cm width x 0.1cm depth; 0.236cm^2 area and 0.024cm^3 volume. oe There is a medium amount of serosanguineous drainage noted. Wound #19 status is Open. Original cause of wound was Gradually Appeared. The date acquired was: 11/10/2022. The wound has been in treatment 1 weeks. The wound is located on the Left,Posterior Lower Leg. The wound measures 1cm length x 0.9cm width x 0.1cm depth; 0.707cm^2 area and 0.071cm^3 volume. There is Fat Layer (Subcutaneous Tissue) exposed. There is a medium amount of serous drainage noted. The wound margin is flat and intact. There is no granulation within the wound bed. There is a large (67-100%) amount of necrotic tissue within the wound bed including Adherent Slough. 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 Bathing/ Shower/ Hygiene: May shower with wound dressing protected with water repellent cover or cast protector. No tub bath. Anesthetic (Use 'Patient Medications' Section for Anesthetic Order Entry): Lidocaine applied to wound bed Edema Control - Lymphedema / Segmental Compressive Device / Other: Elevate legs to the level of the heart and pump ankles as often as possible Elevate leg(s) parallel to the  floor when sitting. Off-Loading: Heel suspension boot - when in bed Medications-Please add to medication list.: Other: - apply AandD ointment to leg, do not apply to wound WOUND #12: - Ankle Wound Laterality: Left, Medial, Distal Cleanser: Soap and Water Every Other Day/30 Days Discharge Instructions: Gently cleanse wound with antibacterial soap, rinse and pat dry prior to dressing wounds Cleanser: Wound Cleanser Every Other Day/30 Days Discharge Instructions: Wash your hands with soap and water. Remove old dressing, discard into plastic bag and place into trash. Cleanse the wound with Wound Cleanser prior to applying a clean dressing using gauze sponges, not tissues or cotton balls. Do not scrub or use excessive force. Pat dry using gauze sponges, not tissue or cotton balls. Prim Dressing: Silvercel Small 2x2 (in/in) Every Other Day/30 Days ary Discharge Instructions: Apply Silvercel Small 2x2 (in/in) as instructed Secondary Dressing: T Adhesive Toll Brothers, 4x4 (in/in) Every Other Day/30 Days elfa Discharge Instructions: Apply over dressing to secure in place. WOUND #15: - Foot Wound Laterality: Dorsal, Left Cleanser: Soap and Water Every Other Day/30 Days Discharge Instructions: Gently cleanse wound with antibacterial soap, rinse and pat dry prior to dressing wounds Cleanser: Wound Cleanser Every Other Day/30 Days Discharge Instructions: Wash your hands with soap and water. Remove old dressing, discard into plastic bag and place into trash. Cleanse the wound with Wound Cleanser prior to applying a clean dressing using  gauze sponges, not tissues or cotton balls. Do not scrub or use excessive force. Pat dry using gauze sponges, not tissue or cotton balls. Prim Dressing: Silvercel Small 2x2 (in/in) Every Other Day/30 Days ary Discharge Instructions: Apply Silvercel Small 2x2 (in/in) as instructed Secondary Dressing: T Adhesive Toll Brothers, 4x4 (in/in) Every Other Day/30  Days elfa Discharge Instructions: Apply over dressing to secure in place. WOUND #16: - Calcaneus Wound Laterality: Right Cleanser: Soap and Water Every Other Day/30 Days Discharge Instructions: Gently cleanse wound with antibacterial soap, rinse and pat dry prior to dressing wounds Cleanser: Wound Cleanser Every Other Day/30 Days Discharge Instructions: Wash your hands with soap and water. Remove old dressing, discard into plastic bag and place into trash. Cleanse the wound with Wound Cleanser prior to applying a clean dressing using gauze sponges, not tissues or cotton balls. Do not scrub or use excessive force. Pat dry using gauze sponges, not tissue or cotton balls. Prim Dressing: Silvercel Small 2x2 (in/in) Every Other Day/30 Days ary Discharge Instructions: Apply Silvercel Small 2x2 (in/in) as instructed Secondary Dressing: T Adhesive Toll Brothers, 4x4 (in/in) Every Other Day/30 Days elfa Discharge Instructions: Apply over dressing to secure in place. WOUND #17: - Lower Leg Wound Laterality: Left, Anterior Cleanser: Soap and Water Every Other Day/30 Days Discharge Instructions: Gently cleanse wound with antibacterial soap, rinse and pat dry prior to dressing wounds Cleanser: Wound Cleanser Every Other Day/30 Days Discharge Instructions: Wash your hands with soap and water. Remove old dressing, discard into plastic bag and place into trash. Cleanse the wound with Wound Cleanser prior to applying a clean dressing using gauze sponges, not tissues or cotton balls. Do not scrub or use excessive force. Pat dry using gauze sponges, not tissue or cotton balls. Prim Dressing: Silvercel Small 2x2 (in/in) Every Other Day/30 Days ary Discharge Instructions: Apply Silvercel Small 2x2 (in/in) as instructed Secondary Dressing: T Adhesive Toll Brothers, 4x4 (in/in) Every Other Day/30 Days elfa Discharge Instructions: Apply over dressing to secure in place. WOUND #18: - T Second Wound  Laterality: Right, Medial oe Cleanser: Soap and Water Every Other Day/30 Days Discharge Instructions: Gently cleanse wound with antibacterial soap, rinse and pat dry prior to dressing wounds Cleanser: Wound Cleanser Every Other Day/30 Days Discharge Instructions: Wash your hands with soap and water. Remove old dressing, discard into plastic bag and place into trash. Cleanse the wound with Wound Cleanser prior to applying a clean dressing using gauze sponges, not tissues or cotton balls. Do not scrub or use excessive force. Pat dry using gauze sponges, not tissue or cotton balls. Prim Dressing: Silvercel Small 2x2 (in/in) Every Other Day/30 Days ary Discharge Instructions: Apply Silvercel Small 2x2 (in/in) as instructed Secondary Dressing: T Adhesive Toll Brothers, 4x4 (in/in) Every Other Day/30 Days elfa Discharge Instructions: Apply over dressing to secure in place. WOUND #19: - Lower Leg Wound Laterality: Left, Posterior Cleanser: Soap and Water Every Other Day/30 Days Discharge Instructions: Gently cleanse wound with antibacterial soap, rinse and pat dry prior to dressing wounds Cleanser: Wound Cleanser Every Other Day/30 Days Discharge Instructions: Wash your hands with soap and water. Remove old dressing, discard into plastic bag and place into trash. Cleanse the wound with Wound Cleanser prior to applying a clean dressing using gauze sponges, not tissues or cotton balls. Do not scrub or use excessive force. Pat dry using gauze sponges, not tissue or cotton balls. Prim Dressing: Silvercel Small 2x2 (in/in) Every Other Day/30 Days ary BRENIN, HEIDELBERGER ([id number]) [id number] Page 13 of 13  Discharge Instructions: Apply Silvercel Small 2x2 (in/in) as instructed Secondary Dressing: T Adhesive Toll Brothers, 4x4 (in/in) Every Other Day/30 Days elfa Discharge Instructions: Apply over dressing to secure in place. 1. I would recommend currently based on what I  am seeing that we have the patient going to continue to monitor for any signs of infection or worsening. I do believe that he is headed in the right direction here were using silver cell pretty much at all locations I believe at this point followed by cover dressings normally T island type dressings. elfa 2. I am good recommend as well that we have the patient continue to have these dressings changed every other day at the facility this is still the appropriate way to go. We will see patient back for reevaluation in 1 week here in the clinic. If anything worsens or changes patient will contact our office for additional recommendations. Electronic Signature(s) Signed: 11/25/2022 9:32:26 AM By: [name] PA-C Entered By: [name] on 11/25/2022 09:32:26 -------------------------------------------------------------------------------- SuperBill Details Patient Name: Date of Service: DAVONTE, SIEBENALER 11/25/2022 Medical Record Number: [id number] Patient Account Number: [id number] Date of Birth/Sex: Treating RN: 15-Sep-1960 (62 y.o. Chad [name] Primary Care Provider: [name] Other Clinician: Referring Provider: Treating Provider/Extender: [name] Weeks in Treatment: 50 Diagnosis Coding ICD-10 Codes Code Description [phone] Chronic venous hypertension (idiopathic) with ulcer and inflammation of left lower extremity 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 side 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, stage 3 Facility Procedures : CPT4 Code: [id number] Description: 62130 - WOUND CARE VISIT-LEV 5 EST PT Modifier: Quantity: 1 Physician Procedures : CPT4 Code Description Modifier [id number] - WC PHYS LEVEL 3 - EST PT ICD-10 Diagnosis Description 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  venous hypertension (idiopathic) with ulcer and inflammation of left lower extremity L97.322 Non-pressure chronic ulcer of left ankle  with fat layer exposed L98.492 Non-pressure chronic ulcer of skin of other sites with fat layer exposed 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 side Quantity: 1 Electronic Signature(s) Signed: 11/25/2022 4:35:08 PM By: Midge Aver MSN RN CNS WTA Signed: 11/25/2022 6:18:39 PM By: [name] PA-C Previous Signature: 11/25/2022 9:32:53 AM Version By: [name] PA-C Entered By: Midge Aver on 11/25/2022 09:33:16

## 2022-11-26 NOTE — Progress Notes (Signed)
JAIYON, WANDER ([id number]) [id number] Page 1 of 14 Visit Report for 11/25/2022 Arrival Information Details Patient Name: Date of Service: Chad [name], Chad [name] 11/25/2022 8:30 A M Medical Record Number: [id number] Patient Account Number: [id number] Date of Birth/Sex: Treating RN: 1960/08/14 (62 y.o. Chad [name] Primary Care Chad [name]: Chad [name] Other Clinician: Referring Chad [name]: Treating Chad [name]/Extender: Chad [name] in Treatment: 50 Visit Information History Since Last Visit All ordered tests and consults were completed: No Patient Arrived: Wheel Chair Added or deleted any medications: No Arrival Time: 08:38 Hospitalized since last visit: Yes Accompanied By: self Has Dressing in Place as Prescribed: Yes Transfer Assistance: None Pain Present Now: No Patient Identification Verified: Yes Secondary Verification Process Completed: Yes Patient Requires Transmission-Based No Precautions: Patient Has Alerts: Yes Patient Alerts: Patient on Blood Thinner NOT diabetic aspirin 81mg  Lives [place] [facility] ABI R 0.81 11/05/22 ABI L 0.59 11/05/22 Electronic Signature(s) Signed: 11/25/2022 4:35:08 PM By: Chad Aver MSN RN CNS WTA Previous Signature: 11/25/2022 9:07:25 AM Version By: Chad Aver MSN RN CNS WTA Entered By: Chad [name] on 11/25/2022 09:15:07 -------------------------------------------------------------------------------- Clinic Level of Care Assessment Details Patient Name: Date of Service: Chad [name], Chad [name] 11/25/2022 8:30 A M Medical Record Number: [id number] Patient Account Number: [id number] Date of Birth/Sex: Treating RN: Dec 22, 1960 (62 y.o. Chad [name] Primary Care Chad [name]: Chad [name] Other Clinician: Referring Chad [name]: Treating Mirel Hundal/Extender: Chad [name] in Treatment: 50 Clinic Level of Care Assessment Items TOOL 4 Quantity Score X- 1 0 Use when only an EandM is performed on  FOLLOW-UP visit ASSESSMENTS - Nursing Assessment / Reassessment X- 1 10 Reassessment of Co-morbidities (includes updates in patient status) X- 1 5 Reassessment of Adherence to Treatment Plan ASSESSMENTS - Wound and Skin A ssessment / Reassessment []  - 0 Simple Wound Assessment / Reassessment - one wound X- 6 5 Complex Wound Assessment / Reassessment - multiple wounds []  - 0 Dermatologic / Skin Assessment (not related to wound area) ASSESSMENTS - Focused Assessment []  - 0 Circumferential Edema Measurements - multi extremities []  - 0 Nutritional Assessment / Counseling / Intervention []  - 0 Lower Extremity Assessment (monofilament, tuning fork, pulses) []  - 0 Peripheral Arterial Disease Assessment (using hand held doppler) ASSESSMENTS - Ostomy and/or Continence Assessment and Care []  - 0 Incontinence Assessment and Management Chad [name], Chad [name] ([id number]) [id number] Page 2 of 14 []  - 0 Ostomy Care Assessment and Management (repouching, etc.) PROCESS - Coordination of Care []  - 0 Simple Patient / Family Education for ongoing care X- 1 20 Complex (extensive) Patient / Family Education for ongoing care X- 1 10 Staff obtains Chiropractor, Records, T Results / Process Orders est []  - 0 Staff telephones HHA, Nursing Homes / Clarify orders / etc []  - 0 Routine Transfer to another Facility (non-emergent condition) []  - 0 Routine Hospital Admission (non-emergent condition) []  - 0 New Admissions / Manufacturing engineer / Ordering NPWT Apligraf, etc. , []  - 0 Emergency Hospital Admission (emergent condition) []  - 0 Simple Discharge Coordination X- 1 15 Complex (extensive) Discharge Coordination PROCESS - Special Needs []  - 0 Pediatric / Minor Patient Management []  - 0 Isolation Patient Management []  - 0 Hearing / Language / Visual special needs []  - 0 Assessment of Community assistance (transportation, D/C planning, etc.) []  - 0 Additional  assistance / Altered mentation []  - 0 Support Surface(s) Assessment (bed, cushion, seat, etc.) INTERVENTIONS - Wound Cleansing / Measurement []  - 0 Simple Wound Cleansing - one wound []  - 0 Complex Wound Cleansing -  multiple wounds []  - 0 Wound Imaging (photographs - any number of wounds) []  - 0 Wound Tracing (instead of photographs) []  - 0 Simple Wound Measurement - one wound []  - 0 Complex Wound Measurement - multiple wounds INTERVENTIONS - Wound Dressings []  - 0 Small Wound Dressing one or multiple wounds X- 6 15 Medium Wound Dressing one or multiple wounds []  - 0 Large Wound Dressing one or multiple wounds []  - 0 Application of Medications - topical []  - 0 Application of Medications - injection INTERVENTIONS - Miscellaneous []  - 0 External ear exam []  - 0 Specimen Collection (cultures, biopsies, blood, body fluids, etc.) []  - 0 Specimen(s) / Culture(s) sent or taken to Lab for analysis []  - 0 Patient Transfer (multiple staff / Nurse, adult / Similar devices) []  - 0 Simple Staple / Suture removal (25 or less) []  - 0 Complex Staple / Suture removal (26 or more) []  - 0 Hypo / Hyperglycemic Management (close monitor of Blood Glucose) []  - 0 Ankle / Brachial Index (ABI) - do not check if billed separately X- 1 5 Vital Signs Has the patient been seen at the hospital within the last three years: Yes Total Score: 185 Level Of Care: New/Established - Level 5 [name], Chad ([id number]) [id number] Page 3 of 14 Electronic Signature(s) Signed: 11/25/2022 4:35:08 PM By: Chad Aver MSN RN CNS WTA Entered By: Chad [name] on 11/25/2022 09:33:06 -------------------------------------------------------------------------------- Encounter Discharge Information Details Patient Name: Date of Service: Chad [name], Chad [name] 11/25/2022 8:30 A M Medical Record Number: [id number] Patient Account Number: [id number] Date of Birth/Sex: Treating RN: Sep 08, 1960 (62  y.o. Chad [name] Primary Care Chad [name]: Chad [name] Other Clinician: Referring Chad [name]: Treating Chad [name]/Extender: Chad [name] in Treatment: 50 Encounter Discharge Information Items Discharge Condition: Stable Ambulatory Status: Wheelchair Discharge Destination: Skilled Nursing Facility Telephoned: Yes Orders Sent: Yes Transportation: Other Accompanied By: self Schedule Follow-up Appointment: Yes Clinical Summary of Care: Electronic Signature(s) Signed: 11/25/2022 4:35:08 PM By: Chad Aver MSN RN CNS WTA Entered By: Chad [name] on 11/25/2022 09:34:34 -------------------------------------------------------------------------------- Lower Extremity Assessment Details Patient Name: Date of Service: Chad [name], Chad [name] 11/25/2022 8:30 A M Medical Record Number: [id number] Patient Account Number: [id number] Date of Birth/Sex: Treating RN: 1961/04/08 (62 y.o. Chad [name] Primary Care Zyan Coby: Chad [name] Other Clinician: Referring Ketan Renz: Treating Aneth Schlagel/Extender: [name] Weeks in Treatment: 50 Edema Assessment Assessed: Kyra Searles: Yes] Franne Forts: Yes] [Left: Edema] [Right: :] Calf Left: Right: Point of Measurement: 33 cm From Medial Instep 37 cm 37 cm Ankle Left: Right: Point of Measurement: 12 cm From Medial Instep 28 cm 22 cm Vascular Assessment Pulses: Dorsalis Pedis Palpable: [Left:Yes] [Right:Yes] Electronic Signature(s) Signed: 11/25/2022 4:35:08 PM By: Chad Aver MSN RN CNS WTA Entered By: Chad [name] on 11/25/2022 09:15:38 Multi Wound Chart Details -------------------------------------------------------------------------------- [name] ([id number]) [id number] Page 4 of 14 Patient Name: Date of Service: Chad [name], Chad [name] 11/25/2022 8:30 A M Medical Record Number: [id number] Patient Account Number: [id number] Date of Birth/Sex: Treating RN: 06-21-1961 (62 y.o. Chad [name] Primary  Care Richardo Popoff: Chad [name] Other Clinician: Referring Broxton Broady: Treating Sabah Zucco/Extender: [name] Weeks in Treatment: 50 Vital Signs Height(in): 69 Pulse(bpm): 81 Weight(lbs): 150 Blood Pressure(mmHg): 113/76 Body Mass Index(BMI): 22.1 Temperature(F): 97.7 Respiratory Rate(breaths/min): 16 Wound Assessments Wound Number: 12 15 16  Photos: Left, Distal, Medial Ankle Left, Dorsal Foot Right Calcaneus Wound Location: Gradually Appeared Gradually Appeared Gradually Appeared Wounding Event: Venous Leg Ulcer Venous Leg Ulcer Pressure Ulcer Primary Etiology: Anemia, Chronic Obstructive Anemia, Chronic  Obstructive Anemia, Chronic Obstructive Comorbid History: Pulmonary Disease (COPD), Coronary Pulmonary Disease (COPD), Coronary Pulmonary Disease (COPD), Coronary Artery Disease, Peripheral Arterial Artery Disease, Peripheral Arterial Artery Disease, Peripheral Arterial Disease, Peripheral Venous Disease, Disease, Peripheral Venous Disease, Disease, Peripheral Venous Disease, Hepatitis C, Osteoarthritis, Hepatitis C, Osteoarthritis, Hepatitis C, Osteoarthritis, Neuropathy, Received Chemotherapy Neuropathy, Received Chemotherapy Neuropathy, Received Chemotherapy 07/12/2019 10/01/2022 09/26/2022 Date Acquired: 50 7 7 Weeks of Treatment: Open Open Open Wound Status: No No No Wound Recurrence: No Yes No Clustered Wound: N/A 2 N/A Clustered Quantity: 2.3x1x0.2 3.5x1x0.2 0.5x0.8x0.1 Measurements L x W x D (cm) 1.806 2.749 0.314 A (cm) : rea 0.361 0.55 0.031 Volume (cm) : 73.90% -16.70% 66.70% % Reduction in Area: 73.90% -133.10% 83.50% % Reduction in Volume: Full Thickness Without Exposed Full Thickness Without Exposed Category/Stage III Classification: Support Structures Support Structures Medium Medium Medium Exudate A mount: Serosanguineous Serosanguineous Serosanguineous Exudate Type: red, brown red, brown red, brown Exudate Color: Distinct,  outline attached N/A N/A Wound Margin: Small (1-33%) Small (1-33%) Medium (34-66%) Granulation Amount: Pink Red Red Granulation Quality: Large (67-100%) Large (67-100%) Medium (34-66%) Necrotic Amount: Adherent Slough Adherent [employer] Necrotic Tissue: Fat Layer (Subcutaneous Tissue): Yes Fat Layer (Subcutaneous Tissue): Yes Fat Layer (Subcutaneous Tissue): Yes Exposed Structures: Fascia: No Fascia: No Fascia: No Tendon: No Tendon: No Tendon: No Muscle: No Muscle: No Muscle: No Joint: No Joint: No Joint: No Bone: No Bone: No Bone: No None None None Epithelialization: Wound Number: [id number]  Photos: Left, Anterior Lower Leg Right, Medial T Second oe Left, Posterior Lower Leg Wound Location: Pressure Injury Pressure Injury Gradually Appeared Wounding Event: Pressure Ulcer Pressure Ulcer Venous Leg Ulcer Primary Etiology: Anemia, Chronic Obstructive Anemia, Chronic Obstructive Anemia, Chronic Obstructive Comorbid History: Pulmonary Disease (COPD), Coronary Pulmonary Disease (COPD), Coronary Pulmonary Disease (COPD), Coronary Artery Disease, Peripheral Arterial Artery Disease, Peripheral Arterial Artery Disease, Peripheral Arterial Disease, Peripheral Venous Disease, Disease, Peripheral Venous Disease, Disease, Peripheral Venous Disease, Fichera, [name] ([id number]) [id number] Page 5 of 14 Hepatitis C, Osteoarthritis, Hepatitis C, Osteoarthritis, Hepatitis C, Osteoarthritis, Neuropathy, Received Chemotherapy Neuropathy, Received Chemotherapy Neuropathy, Received Chemotherapy 09/30/2022 09/26/2022 11/10/2022 Date Acquired: 6 6 1  Weeks of Treatment: Open Open Open Wound Status: No No No Wound Recurrence: No No No Clustered Wound: N/A N/A N/A Clustered Quantity: 3.1x2x0.1 0.5x0.6x0.1 1x0.9x0.1 Measurements L x W x D (cm) 4.869 0.236 0.707 A (cm) : rea 0.487 0.024 0.071 Volume (cm) : -18.10% 57.10% 0.00% % Reduction in  Area: -18.20% 56.40% 0.00% % Reduction in Volume: Category/Stage II Category/Stage II Full Thickness Without Exposed Classification: Support Structures Medium Medium Medium Exudate A mount: Serosanguineous Serosanguineous Serous Exudate Type: red, brown red, brown amber Exudate Color: N/A N/A Flat and Intact Wound Margin: Small (1-33%) N/A None Present (0%) Granulation Amount: Red N/A N/A Granulation Quality: Large (67-100%) N/A Large (67-100%) Necrotic Amount: Eschar N/A Adherent Slough Necrotic Tissue: Fat Layer (Subcutaneous Tissue): Yes N/A Fat Layer (Subcutaneous Tissue): Yes Exposed Structures: Fascia: No Fascia: No Tendon: No Tendon: No Muscle: No Muscle: No Joint: No Joint: No Bone: No Bone: No None N/A None Epithelialization: Treatment Notes Electronic Signature(s) Signed: 11/25/2022 4:35:08 PM By: Chad Aver MSN RN CNS WTA Entered By: Chad [name] on 11/25/2022 09:32:05 -------------------------------------------------------------------------------- Multi-Disciplinary Care Plan Details Patient Name: Date of Service: Chad [name], Chad [name] 11/25/2022 8:30 A M Medical Record Number: [id number] Patient Account Number: [id number] Date of Birth/Sex: Treating RN: [birth year] (62 y.o. Chad [name] Primary Care Ichael Pullara: Chad [name] Other Clinician: Referring Yona Kosek: Treating Artrice Kraker/Extender: [name]  Chad [name] Weeks in Treatment: 50 Active Inactive Necrotic Tissue Nursing Diagnoses: Impaired tissue integrity related to necrotic/devitalized tissue Knowledge deficit related to management of necrotic/devitalized tissue Goals: Necrotic/devitalized tissue will be minimized in the wound bed Date Initiated: 02/07/2022 Target Resolution Date: 12/26/2022 Goal Status: Active Patient/caregiver will verbalize understanding of reason and process for debridement of necrotic tissue Date Initiated: 02/07/2022 Date Inactivated: 11/06/2022 Target Resolution Date:  03/29/2022 Goal Status: Met Interventions: Assess patient pain level pre-, during and post procedure and prior to discharge Provide education on necrotic tissue and debridement process Treatment Activities: Excisional debridement : 02/07/2022 Notes: Venous Leg Ulcer [name] ([id number]) [id number] Page 6 of 14 Nursing Diagnoses: Knowledge deficit related to disease process and management Goals: Patient will maintain optimal edema control Date Initiated: 12/06/2021 Date Inactivated: 12/27/2021 Target Resolution Date: 01/03/2022 Goal Status: Met Patient/caregiver will verbalize understanding of disease process and disease management Date Initiated: 12/06/2021 Target Resolution Date: 11/27/2022 Goal Status: Active Interventions: Assess peripheral edema status every visit. Compression as ordered Notes: Electronic Signature(s) Signed: 11/25/2022 4:35:08 PM By: Chad Aver MSN RN CNS WTA Previous Signature: 11/25/2022 9:08:39 AM Version By: Chad Aver MSN RN CNS WTA Entered By: Chad [name] on 11/25/2022 09:33:24 -------------------------------------------------------------------------------- Pain Assessment Details Patient Name: Date of Service: Chad [name], Chad [name] 11/25/2022 8:30 A M Medical Record Number: [id number] Patient Account Number: [id number] Date of Birth/Sex: Treating RN: Aug 07, 1960 (62 y.o. Chad [name] Primary Care Asra Gambrel: Chad [name] Other Clinician: Referring Umi Mainor: Treating Kaylah Chiasson/Extender: [name] Weeks in Treatment: 50 Active Problems Location of Pain Severity and Description of Pain Patient Has Paino No Site Locations Pain Management and Medication Current Pain Management: Electronic Signature(s) Signed: 11/25/2022 4:35:08 PM By: Chad Aver MSN RN CNS WTA Entered By: Chad [name] on 11/25/2022 09:15:17 -------------------------------------------------------------------------------- Patient/Caregiver  Education Details Patient Name: Date of Service: Denna Haggard 4/30/2024andnbsp8:30 A M Medical Record Number: [id number] Patient Account Number: [id number] Date of Birth/Gender: Treating RN: [birth year] (62 y.o. Chad [name] Primary Care Physician: Chad [name] Other Clinician: Referring Physician: Treating Physician/Extender: [name] [place], [name] ([id number]) [id number] Page 7 of 14 Weeks in Treatment: 50 Education Assessment Education Provided To: Patient Education Topics Provided Wound/Skin Impairment: Handouts: Caring for Your Ulcer Methods: Explain/Verbal Responses: State content correctly Electronic Signature(s) Signed: 11/25/2022 4:35:08 PM By: Chad Aver MSN RN CNS WTA Entered By: Chad [name] on 11/25/2022 09:33:30 -------------------------------------------------------------------------------- Wound Assessment Details Patient Name: Date of Service: Chad [name], Chad [name] 11/25/2022 8:30 A M Medical Record Number: [id number] Patient Account Number: [id number] Date of Birth/Sex: Treating RN: 11-May-1961 (62 y.o. Chad [name] Primary Care Lennyn Bellanca: Chad [name] Other Clinician: Referring Shunna Mikaelian: Treating Teighan Aubert/Extender: [name] Weeks in Treatment: 50 Wound Status Wound Number: 12 Primary Venous Leg Ulcer Etiology: Wound Location: Left, Distal, Medial Ankle Wound Open Wounding Event: Gradually Appeared Status: Date Acquired: 07/12/2019 Comorbid Anemia, Chronic Obstructive Pulmonary Disease (COPD), Coronary Weeks Of Treatment: 50 History: Artery Disease, Peripheral Arterial Disease, Peripheral Venous Clustered Wound: No Disease, Hepatitis C, Osteoarthritis, Neuropathy, Received Chemotherapy Photos Wound Measurements Length: (cm) 2.3 Width: (cm) 1 Depth: (cm) 0.2 Area: (cm) 1.806 Volume: (cm) 0.361 % Reduction in Area: 73.9% % Reduction in Volume: 73.9% Epithelialization: None Wound  Description Classification: Full Thickness Without Exposed Support Wound Margin: Distinct, outline attached Exudate Amount: Medium Exudate Type: Serosanguineous Exudate Color: red, brown Structures Foul Odor After Cleansing: No Slough/Fibrino Yes Wound Bed Granulation Amount: Small (1-33%) Exposed Structure Granulation Quality: Pink Fascia Exposed: No Necrotic Amount: Large (67-100%) Fat  Layer (Subcutaneous Tissue) ExposedDEZMAN, GRANDA ([id number]) [id number] Page 8 of 14 Necrotic Quality: Adherent Slough Tendon Exposed: No Muscle Exposed: No Joint Exposed: No Bone Exposed: No Treatment Notes Wound #12 (Ankle) Wound Laterality: Left, Medial, Distal Cleanser Soap and Water Discharge Instruction: Gently cleanse wound with antibacterial soap, rinse and pat dry prior to dressing wounds Wound Cleanser Discharge Instruction: Wash your hands with soap and water. Remove old dressing, discard into plastic bag and place into trash. Cleanse the wound with Wound Cleanser prior to applying a clean dressing using gauze sponges, not tissues or cotton balls. Do not scrub or use excessive force. Pat dry using gauze sponges, not tissue or cotton balls. Peri-Wound Care Topical Primary Dressing Silvercel Small 2x2 (in/in) Discharge Instruction: Apply Silvercel Small 2x2 (in/in) as instructed Secondary Dressing T Adhesive Island Dressing, 4x4 (in/in) elfa Discharge Instruction: Apply over dressing to secure in place. Secured With Compression Wrap Compression Stockings Facilities manager) Signed: 11/25/2022 4:35:08 PM By: Chad Aver MSN RN CNS WTA Entered By: Chad [name] on 11/25/2022 08:59:52 -------------------------------------------------------------------------------- Wound Assessment Details Patient Name: Date of Service: Chad [name], Chad [name] 11/25/2022 8:30 A M Medical Record Number: [id number] Patient Account Number: [id number] Date of  Birth/Sex: Treating RN: May 09, 1961 (62 y.o. Chad [name] Primary Care Andersen Iorio: Chad [name] Other Clinician: Referring Kenzo Ozment: Treating Irvan Tiedt/Extender: [name] Weeks in Treatment: 50 Wound Status Wound Number: 15 Primary Venous Leg Ulcer Etiology: Wound Location: Left, Dorsal Foot Wound Open Wounding Event: Gradually Appeared Status: Date Acquired: 10/01/2022 Comorbid Anemia, Chronic Obstructive Pulmonary Disease (COPD), Coronary Weeks Of Treatment: 7 History: Artery Disease, Peripheral Arterial Disease, Peripheral Venous Clustered Wound: Yes Disease, Hepatitis C, Osteoarthritis, Neuropathy, Received Chemotherapy Photos LEELYN, JASINSKI ([id number]) [id number] Page 9 of 14 Wound Measurements Length: (cm) 3.5 Width: (cm) 1 Depth: (cm) 0.2 Clustered Quantity: 2 Area: (cm) 2.74 Volume: (cm) 0.55 % Reduction in Area: -16.7% % Reduction in Volume: -133.1% Epithelialization: None 9 Wound Description Classification: Full Thickness Without Exposed Supp Exudate Amount: Medium Exudate Type: Serosanguineous Exudate Color: red, brown ort Structures Foul Odor After Cleansing: No Slough/Fibrino Yes Wound Bed Granulation Amount: Small (1-33%) Exposed Structure Granulation Quality: Red Fascia Exposed: No Necrotic Amount: Large (67-100%) Fat Layer (Subcutaneous Tissue) Exposed: Yes Necrotic Quality: Adherent Slough Tendon Exposed: No Muscle Exposed: No Joint Exposed: No Bone Exposed: No Treatment Notes Wound #15 (Foot) Wound Laterality: Dorsal, Left Cleanser Soap and Water Discharge Instruction: Gently cleanse wound with antibacterial soap, rinse and pat dry prior to dressing wounds Wound Cleanser Discharge Instruction: Wash your hands with soap and water. Remove old dressing, discard into plastic bag and place into trash. Cleanse the wound with Wound Cleanser prior to applying a clean dressing using gauze sponges, not  tissues or cotton balls. Do not scrub or use excessive force. Pat dry using gauze sponges, not tissue or cotton balls. Peri-Wound Care Topical Primary Dressing Silvercel Small 2x2 (in/in) Discharge Instruction: Apply Silvercel Small 2x2 (in/in) as instructed Secondary Dressing T Adhesive [place] Dressing, 4x4 (in/in) elfa Discharge Instruction: Apply over dressing to secure in place. Secured With Compression Wrap Compression Stockings Facilities manager) Signed: 11/25/2022 4:35:08 PM By: Chad Aver MSN RN CNS WTA Entered By: Chad [name] on 11/25/2022 09:00:19 -------------------------------------------------------------------------------- Wound Assessment Details Patient Name: Date of Service: Chad [name], JOSWICK 11/25/2022 8:30 A M Medical Record Number: [id number] Patient Account Number: [id number] Date of Birth/Sex: Treating RN: 1960/12/07 (62 y.o. Chad [name] Primary Care Krisy Dix: Chad [name] Other Clinician: Referring Lavert Matousek: Treating Leslee Haueter/Extender: [name]  Chad [name] Weeks in Treatment: 50 Wound Status Wound Number: 16 Primary Pressure Ulcer Etiology: Wound Location: Right Calcaneus Wound Open Wounding Event: Gradually JUJHAR, EVERETT ([id number]) [id number] Page 10 of 14 Wounding Event: Gradually Appeared Status: Date Acquired: 09/26/2022 Comorbid Anemia, Chronic Obstructive Pulmonary Disease (COPD), Coronary Weeks Of Treatment: 7 History: Artery Disease, Peripheral Arterial Disease, Peripheral Venous Clustered Wound: No Disease, Hepatitis C, Osteoarthritis, Neuropathy, Received Chemotherapy Photos Wound Measurements Length: (cm) 0.5 Width: (cm) 0.8 Depth: (cm) 0.1 Area: (cm) 0.314 Volume: (cm) 0.031 % Reduction in Area: 66.7% % Reduction in Volume: 83.5% Epithelialization: None Wound Description Classification: Category/Stage III Exudate Amount: Medium Exudate Type:  Serosanguineous Exudate Color: red, brown Foul Odor After Cleansing: No Slough/Fibrino Yes Wound Bed Granulation Amount: Medium (34-66%) Exposed Structure Granulation Quality: Red Fascia Exposed: No Necrotic Amount: Medium (34-66%) Fat Layer (Subcutaneous Tissue) Exposed: Yes Necrotic Quality: Adherent Slough Tendon Exposed: No Muscle Exposed: No Joint Exposed: No Bone Exposed: No Treatment Notes Wound #16 (Calcaneus) Wound Laterality: Right Cleanser Soap and Water Discharge Instruction: Gently cleanse wound with antibacterial soap, rinse and pat dry prior to dressing wounds Wound Cleanser Discharge Instruction: Wash your hands with soap and water. Remove old dressing, discard into plastic bag and place into trash. Cleanse the wound with Wound Cleanser prior to applying a clean dressing using gauze sponges, not tissues or cotton balls. Do not scrub or use excessive force. Pat dry using gauze sponges, not tissue or cotton balls. Peri-Wound Care Topical Primary Dressing Silvercel Small 2x2 (in/in) Discharge Instruction: Apply Silvercel Small 2x2 (in/in) as instructed Secondary Dressing T Adhesive Island Dressing, 4x4 (in/in) elfa Discharge Instruction: Apply over dressing to secure in place. Secured With Compression Wrap Compression Stockings Facilities manager) Signed: 11/25/2022 4:35:08 PM By: Chad Aver MSN RN CNS [address] [name] [phone] By: Chad Aver MSN RN CNS Margarita Rana [phone].pdf Page 11 of 14 Signed: 11/25/2022 4:35:08 Entered By: Chad [name] on 11/25/2022 09:00:47 -------------------------------------------------------------------------------- Wound Assessment Details Patient Name: Date of Service: Chad [name], Chad [name] 11/25/2022 8:30 A M Medical Record Number: [id number] Patient Account Number: [id number] Date of Birth/Sex: Treating RN: 1961-01-25 (62 y.o. Chad [name] Primary Care Remee Charley: Chad [name] Other  Clinician: Referring Sulay Brymer: Treating Unique Sillas/Extender: [name] Weeks in Treatment: 50 Wound Status Wound Number: 17 Primary Pressure Ulcer Etiology: Wound Location: Left, Anterior Lower Leg Wound Open Wounding Event: Pressure Injury Status: Date Acquired: 09/30/2022 Comorbid Anemia, Chronic Obstructive Pulmonary Disease (COPD), Coronary Weeks Of Treatment: 6 History: Artery Disease, Peripheral Arterial Disease, Peripheral Venous Clustered Wound: No Disease, Hepatitis C, Osteoarthritis, Neuropathy, Received Chemotherapy Photos Wound Measurements Length: (cm) 3.1 Width: (cm) 2 Depth: (cm) 0.1 Area: (cm) 4.869 Volume: (cm) 0.487 % Reduction in Area: -18.1% % Reduction in Volume: -18.2% Epithelialization: None Wound Description Classification: Category/Stage II Exudate Amount: Medium Exudate Type: Serosanguineous Exudate Color: red, brown Foul Odor After Cleansing: No Slough/Fibrino Yes Wound Bed Granulation Amount: Small (1-33%) Exposed Structure Granulation Quality: Red Fascia Exposed: No Necrotic Amount: Large (67-100%) Fat Layer (Subcutaneous Tissue) Exposed: Yes Necrotic Quality: Eschar Tendon Exposed: No Muscle Exposed: No Joint Exposed: No Bone Exposed: No Treatment Notes Wound #17 (Lower Leg) Wound Laterality: Left, Anterior Cleanser Soap and Water Discharge Instruction: Gently cleanse wound with antibacterial soap, rinse and pat dry prior to dressing wounds Wound Cleanser Discharge Instruction: Wash your hands with soap and water. Remove old dressing, discard into plastic bag and place into trash. Cleanse the wound with Wound Cleanser prior to applying a clean dressing using gauze sponges,  not tissues or cotton balls. Do not scrub or use excessive force. Pat dry using gauze sponges, not tissue or cotton balls. Peri-Wound Care KESEAN, SERVISS ([id number]) [id number] Page 12 of 14 Topical Primary  Dressing Silvercel Small 2x2 (in/in) Discharge Instruction: Apply [place] Small 2x2 (in/in) as instructed Secondary Dressing T Adhesive [place] Dressing, 4x4 (in/in) elfa Discharge Instruction: Apply over dressing to secure in place. Secured With Compression Wrap Compression Stockings Facilities manager) Signed: 11/25/2022 4:35:08 PM By: Chad Aver MSN RN CNS WTA Entered By: Chad [name] on 11/25/2022 09:01:15 -------------------------------------------------------------------------------- Wound Assessment Details Patient Name: Date of Service: JALEEN, FINCH 11/25/2022 8:30 A M Medical Record Number: [id number] Patient Account Number: [id number] Date of Birth/Sex: Treating RN: [birth year] (62 y.o. Chad [name] Primary Care Danie Diehl: Chad [name] Other Clinician: Referring Trevor Duty: Treating Brooklin Rieger/Extender: [name] Weeks in Treatment: 50 Wound Status Wound Number: 18 Primary Pressure Ulcer Etiology: Wound Location: Right, Medial T Second oe Wound Open Wounding Event: Pressure Injury Status: Date Acquired: 09/26/2022 Comorbid Anemia, Chronic Obstructive Pulmonary Disease (COPD), Coronary Weeks Of Treatment: 6 History: Artery Disease, Peripheral Arterial Disease, Peripheral Venous Clustered Wound: No Disease, Hepatitis C, Osteoarthritis, Neuropathy, Received Chemotherapy Photos Wound Measurements Length: (cm) 0.5 Width: (cm) 0.6 Depth: (cm) 0.1 Area: (cm) 0.236 Volume: (cm) 0.024 % Reduction in Area: 57.1% % Reduction in Volume: 56.4% Wound Description Classification: Category/Stage II Exudate Amount: Medium Exudate Type: Serosanguineous Exudate Color: red, brown Treatment Notes Wound #18 (Toe Second) Wound Laterality: Right, Medial [name] ([id number]) [id number] Page 13 of 14 Cleanser Soap and Water Discharge Instruction: Gently cleanse wound with antibacterial soap, rinse and pat dry  prior to dressing wounds Wound Cleanser Discharge Instruction: Wash your hands with soap and water. Remove old dressing, discard into plastic bag and place into trash. Cleanse the wound with Wound Cleanser prior to applying a clean dressing using gauze sponges, not tissues or cotton balls. Do not scrub or use excessive force. Pat dry using gauze sponges, not tissue or cotton balls. Peri-Wound Care Topical Primary Dressing Silvercel Small 2x2 (in/in) Discharge Instruction: Apply Silvercel Small 2x2 (in/in) as instructed Secondary Dressing T Adhesive [place] Dressing, 4x4 (in/in) elfa Discharge Instruction: Apply over dressing to secure in place. Secured With Compression Wrap Compression Stockings Facilities manager) Signed: 11/25/2022 4:35:08 PM By: Chad Aver MSN RN CNS WTA Entered By: Chad [name] on 11/25/2022 09:01:45 -------------------------------------------------------------------------------- Wound Assessment Details Patient Name: Date of Service: RAUDEL, BAZEN 11/25/2022 8:30 A M Medical Record Number: [id number] Patient Account Number: [id number] Date of Birth/Sex: Treating RN: [birth year] (62 y.o. Chad [name] Primary Care Doni Bacha: Chad [name] Other Clinician: Referring Lundon Rosier: Treating Mirko Tailor/Extender: [name] Weeks in Treatment: 50 Wound Status Wound Number: 19 Primary Venous Leg Ulcer Etiology: Wound Location: Left, Posterior Lower Leg Wound Open Wounding Event: Gradually Appeared Status: Date Acquired: 11/10/2022 Comorbid Anemia, Chronic Obstructive Pulmonary Disease (COPD), Coronary Weeks Of Treatment: 1 History: Artery Disease, Peripheral Arterial Disease, Peripheral Venous Clustered Wound: No Disease, Hepatitis C, Osteoarthritis, Neuropathy, Received Chemotherapy Photos Wound Measurements Length: (cm) 1 Width: (cm) 0.9 Depth: (cm) 0.1 Area: (cm) 0.707 Volume: (cm) 0.071 % Reduction in Area: 0% % Reduction  in Volume: 0% Epithelialization: None Wound Description TAVEN, STRITE ([id number]) Classification: Full Thickness Without Exposed Support Structures Wound Margin: Flat and Intact Exudate Amount: Medium Exudate Type: Serous Exudate Color: amber [id number].pdf Page 14 of 14 Foul Odor After Cleansing: No Slough/Fibrino Yes Wound Bed Granulation Amount: None Present (0%) Exposed Structure Necrotic Amount: Large (67-100%)  Fascia Exposed: No Necrotic Quality: Adherent Slough Fat Layer (Subcutaneous Tissue) Exposed: Yes Tendon Exposed: No Muscle Exposed: No Joint Exposed: No Bone Exposed: No Treatment Notes Wound #19 (Lower Leg) Wound Laterality: Left, Posterior Cleanser Soap and Water Discharge Instruction: Gently cleanse wound with antibacterial soap, rinse and pat dry prior to dressing wounds Wound Cleanser Discharge Instruction: Wash your hands with soap and water. Remove old dressing, discard into plastic bag and place into trash. Cleanse the wound with Wound Cleanser prior to applying a clean dressing using gauze sponges, not tissues or cotton balls. Do not scrub or use excessive force. Pat dry using gauze sponges, not tissue or cotton balls. Peri-Wound Care Topical Primary Dressing Silvercel Small 2x2 (in/in) Discharge Instruction: Apply Silvercel Small 2x2 (in/in) as instructed Secondary Dressing T Adhes[place] Dressing, 4x4 (in/in) elfa Discharge Instruction: Apply over dressing to secure in place. Secured With Compression Wrap Compression Stockings Facilities manager) Signed: 11/25/2022 4:35:08 PM By: Chad Aver MSN RN CNS WTA Entered By: Chad [name] on 11/25/2022 09:02:40 -------------------------------------------------------------------------------- Vitals Details Patient Name: Date of Service: Angela Cox [name] 11/25/2022 8:30 A M Medical Record Number: [id number] Patient Account Number: [id number] Date of Birth/Sex:  Treating RN: 1961/05/05 (62 y.o. Chad [name] Primary Care Jacqualin Shirkey: Chad [name] Other Clinician: Referring Irja Wheless: Treating Monicka Cyran/Extender: [name] Weeks in Treatment: 50 Vital Signs Time Taken: 08:46 Temperature (F): 97.7 Height (in): 69 Pulse (bpm): 81 Weight (lbs): 150 Respiratory Rate (breaths/min): 16 Body Mass Index (BMI): 22.1 Blood Pressure (mmHg): 113/76 Reference Range: 80 - 120 mg / dl Electronic Signature(s) Signed: 11/25/2022 4:35:08 PM By: Chad Aver MSN RN CNS WTA Entered By: Chad [name] on 11/25/2022 09:15:11

## 2022-11-28 ENCOUNTER — Inpatient Hospital Stay: Payer: Medicaid Other | Attending: Internal Medicine

## 2022-11-28 DIAGNOSIS — C187 Malignant neoplasm of sigmoid colon: Secondary | ICD-10-CM | POA: Insufficient documentation

## 2022-11-28 DIAGNOSIS — C661 Malignant neoplasm of right ureter: Secondary | ICD-10-CM | POA: Insufficient documentation

## 2022-11-28 DIAGNOSIS — Z5112 Encounter for antineoplastic immunotherapy: Secondary | ICD-10-CM | POA: Insufficient documentation

## 2022-11-28 DIAGNOSIS — Z79620 Long term (current) use of immunosuppressive biologic: Secondary | ICD-10-CM | POA: Insufficient documentation

## 2022-11-28 DIAGNOSIS — Z8546 Personal history of malignant neoplasm of prostate: Secondary | ICD-10-CM | POA: Insufficient documentation

## 2022-11-28 DIAGNOSIS — D509 Iron deficiency anemia, unspecified: Secondary | ICD-10-CM | POA: Insufficient documentation

## 2022-11-28 DIAGNOSIS — F17210 Nicotine dependence, cigarettes, uncomplicated: Secondary | ICD-10-CM | POA: Insufficient documentation

## 2022-11-28 NOTE — Progress Notes (Signed)
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  Past medical history of hepatitis, GERD, COPD, prostate cancer, stroke.  Patient receiving Clover Mealy with Victorino Dike, Associate Professor at [employer] for patient.  Patient playing bingo and did not want to come to the phone.  Victorino Dike reports that patient is eating mostly 50% of breakfast and 50-75% of lunch and supper meals.  He is receiving prostat BID for left and right foot wounds.  Patient is not currently receiving any other supplements at facility.   Medications: reviewed  Labs: reviewed  Anthropometrics:   Height: 67 inches Weight: 160 lb on 4/22 162 lb in March at facility per [facility] 166 lb on 2/24 BMI: 25  4% weight loss in the last 2 1/2 months, not significant   NUTRITION DIAGNOSIS: Increased nutrient needs related to cancer and recent surgery as evidenced by 4% weight loss in the last 2 1/2 months.   INTERVENTION:  Will contact Corinne Ports, RD for [employer] and coordinate care as patient has challenges getting to cancer center.  Current treatment days are on Mondays when RD is at another location.  Emailed RD at [email or web address]   NEXT VISIT: no follow-up Care turned over to Consulting RD at [facility] B. Freida Busman, RD, LDN Registered Dietitian [phone]

## 2022-12-01 ENCOUNTER — Telehealth: Payer: Medicaid Other | Admitting: Hospice and Palliative Medicine

## 2022-12-02 ENCOUNTER — Telehealth (INDEPENDENT_AMBULATORY_CARE_PROVIDER_SITE_OTHER): Payer: Self-pay

## 2022-12-02 NOTE — Telephone Encounter (Signed)
Spoke with Rodney Booze At [facility] and the patient is scheduled for a bilateral leg angio's with Dr. Wyn Quaker at the [facility] on 12/11/22 and 12/18/22. Pre-procedure instructions will be faxed to [facility] at [facility].

## 2022-12-04 ENCOUNTER — Encounter: Payer: Medicaid Other | Attending: Physician Assistant | Admitting: Physician Assistant

## 2022-12-04 DIAGNOSIS — L98492 Non-pressure chronic ulcer of skin of other sites with fat layer exposed: Secondary | ICD-10-CM | POA: Insufficient documentation

## 2022-12-04 DIAGNOSIS — L299 Pruritus, unspecified: Secondary | ICD-10-CM | POA: Insufficient documentation

## 2022-12-04 DIAGNOSIS — L97322 Non-pressure chronic ulcer of left ankle with fat layer exposed: Secondary | ICD-10-CM | POA: Insufficient documentation

## 2022-12-04 DIAGNOSIS — L89613 Pressure ulcer of right heel, stage 3: Secondary | ICD-10-CM | POA: Insufficient documentation

## 2022-12-04 DIAGNOSIS — I69354 Hemiplegia and hemiparesis following cerebral infarction affecting left non-dominant side: Secondary | ICD-10-CM | POA: Diagnosis not present

## 2022-12-04 DIAGNOSIS — I872 Venous insufficiency (chronic) (peripheral): Secondary | ICD-10-CM | POA: Diagnosis not present

## 2022-12-04 DIAGNOSIS — B1920 Unspecified viral hepatitis C without hepatic coma: Secondary | ICD-10-CM | POA: Diagnosis not present

## 2022-12-04 DIAGNOSIS — I87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 hypertension (idiopathic) with ulcer and inflammation of left lower extremity: Secondary | ICD-10-CM | POA: Diagnosis present

## 2022-12-04 DIAGNOSIS — Z933 Colostomy status: Secondary | ICD-10-CM | POA: Diagnosis not present

## 2022-12-04 DIAGNOSIS — Z5900 Homelessness unspecified: Secondary | ICD-10-CM | POA: Diagnosis not present

## 2022-12-04 NOTE — Progress Notes (Signed)
ELMOND, OUK ([id number]) [id number] Page 1 of 14 Visit Report for 12/04/2022 Arrival Information Details Patient Name: Date of Service: Chad [name], Chad [name] 12/04/2022 10:15 A M Medical Record Number: [id number] Patient Account Number: [id number] Date of Birth/Sex: Treating RN: 1961-04-17 (62 y.o. [name] Primary Care Jian Hodgman: [name] Other Clinician: Referring Adith Tejada: Treating Demaree Liberto/Extender: [name] in Treatment: 74 Visit Information History Since Last Visit Added or deleted any medications: No Patient Arrived: Wheel Chair Has Dressing in Place as Prescribed: Yes Arrival Time: 10:01 Pain Present Now: No Accompanied By: self Transfer Assistance: None Patient Identification Verified: Yes Secondary Verification Process Completed: Yes Patient Requires Transmission-Based No Precautions: Patient Has Alerts: Yes Patient Alerts: Patient on Blood Thinner NOT diabetic aspirin 81mg  Lives [name] ABI R 0.81 11/05/22 ABI L 0.59 11/05/22 Electronic Signature(s) Signed: 12/04/2022 3:10:23 PM By: Midge Aver MSN RN CNS WTA Entered By: Midge Aver on 12/04/2022 10:04:07 -------------------------------------------------------------------------------- Clinic Level of Care Assessment Details Patient Name: Date of Service: Chad [name], Chad [name] 12/04/2022 10:15 A M Medical Record Number: [id number] Patient Account Number: [id number] Date of Birth/Sex: Treating RN: July 28, 1961 (62 y.o. [name] Primary Care Lendell Gallick: [name] Other Clinician: Referring Emonni Depasquale: Treating Kadeen Sroka/Extender: [name] in Treatment: 51 Clinic Level of Care Assessment Items TOOL 4 Quantity Score X- 1 0 Use when only an EandM is performed on FOLLOW-UP visit ASSESSMENTS - Nursing Assessment / Reassessment X- 1 10 Reassessment of Co-morbidities (includes updates in patient status) X- 1 5 Reassessment of  Adherence to Treatment Plan ASSESSMENTS - Wound and Skin A ssessment / Reassessment []  - 0 Simple Wound Assessment / Reassessment - one wound X- 6 5 Complex Wound Assessment / Reassessment - multiple wounds []  - 0 Dermatologic / Skin Assessment (not related to wound area) ASSESSMENTS - Focused Assessment []  - 0 Circumferential Edema Measurements - multi extremities []  - 0 Nutritional Assessment / Counseling / Intervention []  - 0 Lower Extremity Assessment (monofilament, tuning fork, pulses) []  - 0 Peripheral Arterial Disease Assessment (using hand held doppler) ASSESSMENTS - Ostomy and/or Continence Assessment and Care []  - 0 Incontinence Assessment and Management []  - 0 Ostomy Care Assessment and Management (repouching, etc.) [name] ([id number]) [id number] Page 2 of 14 PROCESS - Coordination of Care []  - 0 Simple Patient / Family Education for ongoing care X- 1 20 Complex (extensive) Patient / Family Education for ongoing care X- 1 10 Staff obtains Chiropractor, Records, T Results / Process Orders est []  - 0 Staff telephones HHA, Nursing Homes / Clarify orders / etc []  - 0 Routine Transfer to another Facility (non-emergent condition) []  - 0 Routine Hospital Admission (non-emergent condition) []  - 0 New Admissions / Manufacturing engineer / Ordering NPWT Apligraf, etc. , []  - 0 Emergency Hospital Admission (emergent condition) []  - 0 Simple Discharge Coordination X- 1 15 Complex (extensive) Discharge Coordination PROCESS - Special Needs []  - 0 Pediatric / Minor Patient Management []  - 0 Isolation Patient Management []  - 0 Hearing / Language / Visual special needs []  - 0 Assessment of Community assistance (transportation, D/C planning, etc.) []  - 0 Additional assistance / Altered mentation []  - 0 Support Surface(s) Assessment (bed, cushion, seat, etc.) INTERVENTIONS - Wound Cleansing / Measurement []  - 0 Simple Wound  Cleansing - one wound X- 6 5 Complex Wound Cleansing - multiple wounds X- 1 5 Wound Imaging (photographs - any number of wounds) []  - 0 Wound Tracing (instead of photographs) []  - 0 Simple Wound  Measurement - one wound X- 6 5 Complex Wound Measurement - multiple wounds INTERVENTIONS - Wound Dressings X - Small Wound Dressing one or multiple wounds 6 10 []  - 0 Medium Wound Dressing one or multiple wounds []  - 0 Large Wound Dressing one or multiple wounds []  - 0 Application of Medications - topical []  - 0 Application of Medications - injection INTERVENTIONS - Miscellaneous []  - 0 External ear exam []  - 0 Specimen Collection (cultures, biopsies, blood, body fluids, etc.) []  - 0 Specimen(s) / Culture(s) sent or taken to Lab for analysis []  - 0 Patient Transfer (multiple staff / Nurse, adult / Similar devices) []  - 0 Simple Staple / Suture removal (25 or less) []  - 0 Complex Staple / Suture removal (26 or more) []  - 0 Hypo / Hyperglycemic Management (close monitor of Blood Glucose) []  - 0 Ankle / Brachial Index (ABI) - do not check if billed separately X- 1 5 Vital Signs Has the patient been seen at the hospital within the last three years: Yes Total Score: 220 Level Of Care: New/Established - Level [address] Chad [name], Chad [name] ([id number]) [id number] Page 3 of 14 Signed: 12/04/2022 3:10:23 PM By: Midge Aver MSN RN CNS WTA Entered By: Midge Aver on 12/04/2022 11:10:43 -------------------------------------------------------------------------------- Encounter Discharge Information Details Patient Name: Date of Service: Chad [name], Chad [name] [name] 12/04/2022 10:15 A M Medical Record Number: [id number] Patient Account Number: [id number] Date of Birth/Sex: Treating RN: 1961/02/21 (62 y.o. [name] Primary Care Jerrald Doverspike: [name] Other Clinician: Referring Kolden Dupee: Treating Meliss Fleek/Extender: [name] in  Treatment: 68 Encounter Discharge Information Items Discharge Condition: Stable Ambulatory Status: Wheelchair Discharge Destination: Home Transportation: Other Accompanied By: self Schedule Follow-up Appointment: Yes Clinical Summary of Care: Electronic Signature(s) Signed: 12/04/2022 3:10:23 PM By: Midge Aver MSN RN CNS WTA Entered By: Midge Aver on 12/04/2022 11:12:00 -------------------------------------------------------------------------------- Lower Extremity Assessment Details Patient Name: Date of Service: Chad [name], Chad [name] 12/04/2022 10:15 A M Medical Record Number: [id number] Patient Account Number: [id number] Date of Birth/Sex: Treating RN: 1960/12/28 (62 y.o. [name] Primary Care Jachelle Fluty: [name] Other Clinician: Referring Shivani Barrantes: Treating Kennedie Pardoe/Extender: [name] Weeks in Treatment: 51 Edema Assessment Assessed: Kyra Searles: Yes] Franne Forts: Yes] Edema: [Left: No] [Right: No] Calf Left: Right: Point of Measurement: 33 cm From Medial Instep 37 cm 37 cm Ankle Left: Right: Point of Measurement: 12 cm From Medial Instep 28 cm 22 cm Vascular Assessment Pulses: Dorsalis Pedis Palpable: [Left:Yes] [Right:Yes] Electronic Signature(s) Signed: 12/04/2022 3:10:23 PM By: Midge Aver MSN RN CNS WTA Entered By: Midge Aver on 12/04/2022 10:24:53 -------------------------------------------------------------------------------- Multi Wound Chart Details Patient Name: Date of Service: Chad [name] 12/04/2022 10:15 A M Medical Record Number: [id number] Patient Account Number: [id number] Date of Birth/Sex: Treating RN: [birth year] (62 y.o. Chancey, Blauser, [name] ([id number]) [id number] Page 4 of 14 Primary Care Catalaya Garr: [name] Other Clinician: Referring Anara Cowman: Treating Stepanie Graver/Extender: [name] in Treatment: 51 Vital Signs Height(in): 69 Pulse(bpm): 89 Weight(lbs):  150 Blood Pressure(mmHg): 119/75 Body Mass Index(BMI): 22.1 Temperature(F): 97.4 Respiratory Rate(breaths/min): 16 [12:Photos:] Left, Distal, Medial Ankle Left, Dorsal Foot Right Calcaneus Wound Location: Gradually Appeared Gradually Appeared Gradually Appeared Wounding Event: Venous Leg Ulcer Venous Leg Ulcer Pressure Ulcer Primary Etiology: Anemia, Chronic Obstructive Anemia, Chronic Obstructive Anemia, Chronic Obstructive Comorbid History: Pulmonary Disease (COPD), Coronary Pulmonary Disease (COPD), Coronary Pulmonary Disease (COPD), Coronary Artery Disease, Peripheral Arterial Artery Disease, Peripheral Arterial Artery Disease, Peripheral Arterial Disease, Peripheral Venous Disease, Disease, Peripheral Venous Disease,  Disease, Peripheral Venous Disease, Hepatitis C, Osteoarthritis, Hepatitis C, Osteoarthritis, Hepatitis C, Osteoarthritis, Neuropathy, Received Chemotherapy Neuropathy, Received Chemotherapy Neuropathy, Received Chemotherapy 07/12/2019 10/01/2022 09/26/2022 Date Acquired: 51 8 8 Weeks of Treatment: Open Open Open Wound Status: No No No Wound Recurrence: No Yes No Clustered Wound: N/A 2 N/A Clustered Quantity: 2.5x1x0.2 3.5x1.2x0.2 0.7x1x0.1 Measurements L x W x D (cm) 1.963 3.299 0.55 A (cm) : rea 0.393 0.66 0.055 Volume (cm) : 71.60% -40.00% 41.60% % Reduction in Area: 71.60% -179.70% 70.70% % Reduction in Volume: Full Thickness Without Exposed Full Thickness Without Exposed Category/Stage III Classification: Support Structures Support Structures Medium Medium Medium Exudate A mount: Serosanguineous Serosanguineous Serosanguineous Exudate Type: red, brown red, brown red, brown Exudate Color: Distinct, outline attached N/A N/A Wound Margin: Small (1-33%) Small (1-33%) Medium (34-66%) Granulation Amount: Pink Red Red Granulation Quality: Large (67-100%) Large (67-100%) Medium (34-66%) Necrotic Amount: Adherent Slough Adherent [facility] Adherent  Slough Necrotic Tissue: Fat Layer (Subcutaneous Tissue): Yes Fat Layer (Subcutaneous Tissue): Yes Fat Layer (Subcutaneous Tissue): Yes Exposed Structures: Fascia: No Fascia: No Fascia: No Tendon: No Tendon: No Tendon: No Muscle: No Muscle: No Muscle: No Joint: No Joint: No Joint: No Bone: No Bone: No Bone: No None None None Epithelialization: Wound Number: [id number]  Photos: Left, Anterior Lower Leg Right, Medial T Second oe Left, Posterior Lower Leg Wound Location: Pressure Injury Pressure Injury Gradually Appeared Wounding Event: Pressure Ulcer Pressure Ulcer Venous Leg Ulcer Primary Etiology: Anemia, Chronic Obstructive Anemia, Chronic Obstructive Anemia, Chronic Obstructive Comorbid History: Pulmonary Disease (COPD), Coronary Pulmonary Disease (COPD), Coronary Pulmonary Disease (COPD), Coronary Artery Disease, Peripheral Arterial Artery Disease, Peripheral Arterial Artery Disease, Peripheral Arterial Disease, Peripheral Venous Disease, Disease, Peripheral Venous Disease, Disease, Peripheral Venous Disease, Hepatitis C, Osteoarthritis, Hepatitis C, Osteoarthritis, Hepatitis C, Osteoarthritis, Neuropathy, Received Chemotherapy Neuropathy, Received Chemotherapy Neuropathy, Received Chemotherapy 09/30/2022 09/26/2022 11/10/2022 Date Acquired: 7 7 3  Weeks of TreatmentLETHANIEL, Chad [name] ([id number]) [id number] Page 5 of 14 Open Open Open Wound Status: No No No Wound Recurrence: No No No Clustered Wound: N/A N/A N/A Clustered Quantity: 3.2x2.3x0.2 0.3x0.3x0.1 1x0.5x0.1 Measurements L x W x D (cm) 5.781 0.071 0.393 A (cm) : rea 1.156 0.007 0.039 Volume (cm) : -40.20% 87.10% 44.40% % Reduction in Area: -180.60% 87.30% 45.10% % Reduction in Volume: Category/Stage II Category/Stage II Full Thickness Without Exposed Classification: Support Structures Medium Medium Medium Exudate A mount: Serosanguineous Serosanguineous Serous Exudate  Type: red, brown red, brown amber Exudate Color: N/A N/A Flat and Intact Wound Margin: Small (1-33%) N/A None Present (0%) Granulation Amount: Red N/A N/A Granulation Quality: Large (67-100%) N/A Large (67-100%) Necrotic Amount: Eschar N/A Adherent Slough Necrotic Tissue: Fat Layer (Subcutaneous Tissue): Yes N/A Fat Layer (Subcutaneous Tissue): Yes Exposed Structures: Fascia: No Fascia: No Tendon: No Tendon: No Muscle: No Muscle: No Joint: No Joint: No Bone: No Bone: No None N/A None Epithelialization: Treatment Notes Electronic Signature(s) Signed: 12/04/2022 3:10:23 PM By: Midge Aver MSN RN CNS WTA Entered By: Midge Aver on 12/04/2022 11:09:28 -------------------------------------------------------------------------------- Multi-Disciplinary Care Plan Details Patient Name: Date of Service: Chad [name] 12/04/2022 10:15 A M Medical Record Number: [id number] Patient Account Number: [id number] Date of Birth/Sex: Treating RN: [birth year] (62 y.o. [name] Primary Care Amandalee Lacap: [name] Other Clinician: Referring Aastha Dayley: Treating Kaylan Friedmann/Extender: [name] in Treatment: 105 Active Inactive Necrotic Tissue Nursing Diagnoses: Impaired tissue integrity related to necrotic/devitalized tissue Knowledge deficit related to management of necrotic/devitalized tissue Goals: Necrotic/devitalized tissue will be minimized in the wound bed Date  Initiated: 02/07/2022 Target Resolution Date: 12/26/2022 Goal Status: Active Patient/caregiver will verbalize understanding of reason and process for debridement of necrotic tissue Date Initiated: 02/07/2022 Date Inactivated: 11/06/2022 Target Resolution Date: 03/29/2022 Goal Status: Met Interventions: Assess patient pain level pre-, during and post procedure and prior to discharge Provide education on necrotic tissue and debridement process Treatment Activities: Excisional debridement :  02/07/2022 Notes: Venous Leg Ulcer Nursing Diagnoses: Knowledge deficit related to disease process and management GoalsGEROD, BOHRER ([id number]) [id number] Page 6 of 14 Patient will maintain optimal edema control Date Initiated: 12/06/2021 Date Inactivated: 12/27/2021 Target Resolution Date: 01/03/2022 Goal Status: Met Patient/caregiver will verbalize understanding of disease process and disease management Date Initiated: 12/06/2021 Target Resolution Date: 11/27/2022 Goal Status: Active Interventions: Assess peripheral edema status every visit. Compression as ordered Notes: Electronic Signature(s) Signed: 12/04/2022 3:10:23 PM By: Midge Aver MSN RN CNS WTA Entered By: Midge Aver on 12/04/2022 11:10:59 -------------------------------------------------------------------------------- Pain Assessment Details Patient Name: Date of Service: DEAIRE, CLENNON 12/04/2022 10:15 A M Medical Record Number: [id number] Patient Account Number: [id number] Date of Birth/Sex: Treating RN: Aug 21, 1960 (62 y.o. [name] Primary Care Demetra Moya: [name] Other Clinician: Referring Bray Vickerman: Treating Mart Colpitts/Extender: [name] Weeks in Treatment: 52 Active Problems Location of Pain Severity and Description of Pain Patient Has Paino No Site Locations Pain Management and Medication Current Pain Management: Electronic Signature(s) Signed: 12/04/2022 3:10:23 PM By: Midge Aver MSN RN CNS WTA Entered By: Midge Aver on 12/04/2022 10:09:19 -------------------------------------------------------------------------------- Patient/Caregiver Education Details Patient Name: Date of Service: [place], RO [name] 5/9/2024andnbsp10:15 A M Medical Record Number: [id number] Patient Account Number: [id number] Date of Birth/Gender: Treating RN: 01-Mar-1961 (62 y.o. [name] Primary Care Physician: [name] Other Clinician: Referring Physician: Treating  Physician/Extender: [name] Weeks in Treatment: [address] [place], [name] ([id number]) [id number] Page 7 of 14 Education Provided To: Patient Education Topics Provided Wound/Skin Impairment: Handouts: Caring for Your Ulcer Methods: Explain/Verbal Responses: State content correctly Electronic Signature(s) Signed: 12/04/2022 3:10:23 PM By: Midge Aver MSN RN CNS WTA Entered By: Midge Aver on 12/04/2022 11:11:10 -------------------------------------------------------------------------------- Wound Assessment Details Patient Name: Date of Service: Chad [name] 12/04/2022 10:15 A M Medical Record Number: [id number] Patient Account Number: [id number] Date of Birth/Sex: Treating RN: [birth year] (62 y.o. [name] Primary Care Raima Geathers: [name] Other Clinician: Referring Rozlyn Yerby: Treating Giovanny Dugal/Extender: [name] Weeks in Treatment: 51 Wound Status Wound Number: 12 Primary Venous Leg Ulcer Etiology: Wound Location: Left, Distal, Medial Ankle Wound Open Wounding Event: Gradually Appeared Status: Date Acquired: 07/12/2019 Comorbid Anemia, Chronic Obstructive Pulmonary Disease (COPD), Coronary Weeks Of Treatment: 51 History: Artery Disease, Peripheral Arterial Disease, Peripheral Venous Clustered Wound: No Disease, Hepatitis C, Osteoarthritis, Neuropathy, Received Chemotherapy Photos Wound Measurements Length: (cm) 2.5 Width: (cm) 1 Depth: (cm) 0.2 Area: (cm) 1.963 Volume: (cm) 0.393 % Reduction in Area: 71.6% % Reduction in Volume: 71.6% Epithelialization: None Wound Description Classification: Full Thickness Without Exposed Support Wound Margin: Distinct, outline attached Exudate Amount: Medium Exudate Type: Serosanguineous Exudate Color: red, brown Structures Foul Odor After Cleansing: No Slough/Fibrino Yes Wound Bed Granulation Amount: Small (1-33%) Exposed  Structure Granulation Quality: Pink Fascia Exposed: No Necrotic Amount: Large (67-100%) Fat Layer (Subcutaneous Tissue) Exposed: Yes Necrotic Quality: Adherent Slough Tendon Exposed: No Muscle Exposed: No Joint Exposed: No Bone Exposed: No Zajkowski, [name] ([id number]) [id number] Page 8 of 14 Treatment Notes Wound #12 (Ankle) Wound Laterality: Left, Medial, Distal Cleanser Soap and Water Discharge Instruction: Gently cleanse wound with antibacterial soap,  rinse and pat dry prior to dressing wounds Wound Cleanser Discharge Instruction: Wash your hands with soap and water. Remove old dressing, discard into plastic bag and place into trash. Cleanse the wound with Wound Cleanser prior to applying a clean dressing using gauze sponges, not tissues or cotton balls. Do not scrub or use excessive force. Pat dry using gauze sponges, not tissue or cotton balls. Peri-Wound Care Topical Primary Dressing Silvercel Small 2x2 (in/in) Discharge Instruction: Apply Silvercel Small 2x2 (in/in) as instructed Secondary Dressing T Adhesive Island Dressing, 4x4 (in/in) elfa Discharge Instruction: Apply over dressing to secure in place. Secured With Compression Wrap Compression Stockings Facilities manager) Signed: 12/04/2022 3:10:23 PM By: Midge Aver MSN RN CNS WTA Entered By: Midge Aver on 12/04/2022 10:20:54 -------------------------------------------------------------------------------- Wound Assessment Details Patient Name: Date of Service: Chad [name], Chad [name] 12/04/2022 10:15 A M Medical Record Number: [id number] Patient Account Number: [id number] Date of Birth/Sex: Treating RN: [birth year] (62 y.o. [name] Primary Care Ronniesha Seibold: [name] Other Clinician: Referring Waldine Zenz: Treating Javaria Knapke/Extender: [name] Weeks in Treatment: 51 Wound Status Wound Number: 15 Primary Venous Leg Ulcer Etiology: Wound Location: Left,  Dorsal Foot Wound Open Wounding Event: Gradually Appeared Status: Date Acquired: 10/01/2022 Comorbid Anemia, Chronic Obstructive Pulmonary Disease (COPD), Coronary Weeks Of Treatment: 8 History: Artery Disease, Peripheral Arterial Disease, Peripheral Venous Clustered Wound: Yes Disease, Hepatitis C, Osteoarthritis, Neuropathy, Received Chemotherapy Photos Wound Measurements Length: (cm) 3.5 Width: (cm) 1.2 Hartog, Taytum ([id number]) Depth: (cm) 0.2 Clustered Quantity: 2 Area: (cm) 3.299 Volume: (cm) 0.66 % Reduction in Area: -40% % Reduction in Volume: -179.7% [id number] Page 9 of 14 Epithelialization: None Wound Description Classification: Full Thickness Without Exposed Supp Exudate Amount: Medium Exudate Type: Serosanguineous Exudate Color: red, brown ort Structures Foul Odor After Cleansing: No Slough/Fibrino Yes Wound Bed Granulation Amount: Small (1-33%) Exposed Structure Granulation Quality: Red Fascia Exposed: No Necrotic Amount: Large (67-100%) Fat Layer (Subcutaneous Tissue) Exposed: Yes Necrotic Quality: Adherent Slough Tendon Exposed: No Muscle Exposed: No Joint Exposed: No Bone Exposed: No Treatment Notes Wound #15 (Foot) Wound Laterality: Dorsal, Left Cleanser Soap and Water Discharge Instruction: Gently cleanse wound with antibacterial soap, rinse and pat dry prior to dressing wounds Wound Cleanser Discharge Instruction: Wash your hands with soap and water. Remove old dressing, discard into plastic bag and place into trash. Cleanse the wound with Wound Cleanser prior to applying a clean dressing using gauze sponges, not tissues or cotton balls. Do not scrub or use excessive force. Pat dry using gauze sponges, not tissue or cotton balls. Peri-Wound Care Topical Primary Dressing Silvercel Small 2x2 (in/in) Discharge Instruction: Apply Silvercel Small 2x2 (in/in) as instructed Secondary Dressing T Adhesive [place]  Dressing, 4x4 (in/in) elfa Discharge Instruction: Apply over dressing to secure in place. Secured With Compression Wrap Compression Stockings Facilities manager) Signed: 12/04/2022 3:10:23 PM By: Midge Aver MSN RN CNS WTA Entered By: Midge Aver on 12/04/2022 10:21:21 -------------------------------------------------------------------------------- Wound Assessment Details Patient Name: Date of Service: Chad [name], Chad [name] 12/04/2022 10:15 A M Medical Record Number: [id number] Patient Account Number: [id number] Date of Birth/Sex: Treating RN: April 16, 1961 (62 y.o. [name] Primary Care Dezmen Alcock: [name] Other Clinician: Referring Micajah Dennin: Treating Gwenneth Whiteman/Extender: [name] Weeks in Treatment: 51 Wound Status Wound Number: 16 Primary Pressure Ulcer Etiology: Wound Location: Right Calcaneus Wound Open Wounding Event: Gradually Appeared Status: Date Acquired: 09/26/2022 Comorbid Anemia, Chronic Obstructive Pulmonary Disease (COPD), Coronary Weeks Of Treatment: 8 History: Artery Disease, Peripheral Arterial Disease, Peripheral Venous Clustered Wound: No  Disease, Hepatitis C, Osteoarthritis, Neuropathy, Received ALYIS, CORT ([id number]) [id number] Page 10 of 14 Chemotherapy Photos Wound Measurements Length: (cm) 0.7 Width: (cm) 1 Depth: (cm) 0.1 Area: (cm) 0.55 Volume: (cm) 0.055 % Reduction in Area: 41.6% % Reduction in Volume: 70.7% Epithelialization: None Wound Description Classification: Category/Stage III Exudate Amount: Medium Exudate Type: Serosanguineous Exudate Color: red, brown Foul Odor After Cleansing: No Slough/Fibrino Yes Wound Bed Granulation Amount: Medium (34-66%) Exposed Structure Granulation Quality: Red Fascia Exposed: No Necrotic Amount: Medium (34-66%) Fat Layer (Subcutaneous Tissue) Exposed: Yes Necrotic Quality: Adherent Slough Tendon Exposed: No Muscle Exposed: No Joint  Exposed: No Bone Exposed: No Treatment Notes Wound #16 (Calcaneus) Wound Laterality: Right Cleanser Soap and Water Discharge Instruction: Gently cleanse wound with antibacterial soap, rinse and pat dry prior to dressing wounds Wound Cleanser Discharge Instruction: Wash your hands with soap and water. Remove old dressing, discard into plastic bag and place into trash. Cleanse the wound with Wound Cleanser prior to applying a clean dressing using gauze sponges, not tissues or cotton balls. Do not scrub or use excessive force. Pat dry using gauze sponges, not tissue or cotton balls. Peri-Wound Care Topical Primary Dressing Silvercel Small 2x2 (in/in) Discharge Instruction: Apply Silvercel Small 2x2 (in/in) as instructed Secondary Dressing T Adhesive [place] Dressing, 4x4 (in/in) elfa Discharge Instruction: Apply over dressing to secure in place. Secured With Compression Wrap Compression Stockings Facilities manager) Signed: 12/04/2022 3:10:23 PM By: Midge Aver MSN RN CNS WTA Entered By: Midge Aver on 12/04/2022 10:21:52 [name] ([id number]) [id number] Page 11 of 14 -------------------------------------------------------------------------------- Wound Assessment Details Patient Name: Date of Service: Chad [name], Chad [name] 12/04/2022 10:15 A M Medical Record Number: [id number] Patient Account Number: [id number] Date of Birth/Sex: Treating RN: [birth year] (62 y.o. [name] Primary Care Jousha Schwandt: [name] Other Clinician: Referring Lila Lufkin: Treating Donta Fuster/Extender: [name] Weeks in Treatment: 51 Wound Status Wound Number: 17 Primary Pressure Ulcer Etiology: Wound Location: Left, Anterior Lower Leg Wound Open Wounding Event: Pressure Injury Status: Date Acquired: 09/30/2022 Comorbid Anemia, Chronic Obstructive Pulmonary Disease (COPD), Coronary Weeks Of Treatment: 7 History: Artery Disease, Peripheral  Arterial Disease, Peripheral Venous Clustered Wound: No Disease, Hepatitis C, Osteoarthritis, Neuropathy, Received Chemotherapy Photos Wound Measurements Length: (cm) 3.2 Width: (cm) 2.3 Depth: (cm) 0.2 Area: (cm) 5.781 Volume: (cm) 1.156 % Reduction in Area: -40.2% % Reduction in Volume: -180.6% Epithelialization: None Wound Description Classification: Category/Stage II Exudate Amount: Medium Exudate Type: Serosanguineous Exudate Color: red, brown Foul Odor After Cleansing: No Slough/Fibrino Yes Wound Bed Granulation Amount: Small (1-33%) Exposed Structure Granulation Quality: Red Fascia Exposed: No Necrotic Amount: Large (67-100%) Fat Layer (Subcutaneous Tissue) Exposed: Yes Necrotic Quality: Eschar Tendon Exposed: No Muscle Exposed: No Joint Exposed: No Bone Exposed: No Treatment Notes Wound #17 (Lower Leg) Wound Laterality: Left, Anterior Cleanser Soap and Water Discharge Instruction: Gently cleanse wound with antibacterial soap, rinse and pat dry prior to dressing wounds Wound Cleanser Discharge Instruction: Wash your hands with soap and water. Remove old dressing, discard into plastic bag and place into trash. Cleanse the wound with Wound Cleanser prior to applying a clean dressing using gauze sponges, not tissues or cotton balls. Do not scrub or use excessive force. Pat dry using gauze sponges, not tissue or cotton balls. Peri-Wound Care Topical Primary Dressing LEVII, DINGUS ([id number]) [id number] Page 12 of 14 [place] Small 2x2 (in/in) Discharge Instruction: Apply [place] Small 2x2 (in/in) as instructed Secondary Dressing T Adhesive [place] Dressing, 4x4 (in/in) elfa Discharge Instruction: Apply over dressing to secure in place.  Secured With Compression Wrap Compression Stockings Facilities manager) Signed: 12/04/2022 3:10:23 PM By: Midge Aver MSN RN CNS WTA Entered By: Midge Aver on 12/04/2022  10:22:24 -------------------------------------------------------------------------------- Wound Assessment Details Patient Name: Date of Service: Chad [name], Chad [name] 12/04/2022 10:15 A M Medical Record Number: [id number] Patient Account Number: [id number] Date of Birth/Sex: Treating RN: 10-Feb-1961 (62 y.o. [name] Primary Care Pelagia Iacobucci: [name] Other Clinician: Referring Dolores Mcgovern: Treating Kerstie Agent/Extender: [name] Weeks in Treatment: 51 Wound Status Wound Number: 18 Primary Pressure Ulcer Etiology: Wound Location: Right, Medial T Second oe Wound Open Wounding Event: Pressure Injury Status: Date Acquired: 09/26/2022 Comorbid Anemia, Chronic Obstructive Pulmonary Disease (COPD), Coronary Weeks Of Treatment: 7 History: Artery Disease, Peripheral Arterial Disease, Peripheral Venous Clustered Wound: No Disease, Hepatitis C, Osteoarthritis, Neuropathy, Received Chemotherapy Photos Wound Measurements Length: (cm) 0.3 Width: (cm) 0.3 Depth: (cm) 0.1 Area: (cm) 0.071 Volume: (cm) 0.007 % Reduction in Area: 87.1% % Reduction in Volume: 87.3% Wound Description Classification: Category/Stage II Exudate Amount: Medium Exudate Type: Serosanguineous Exudate Color: red, brown Treatment Notes Wound #18 (Toe Second) Wound Laterality: Right, Medial Cleanser Soap and Water Discharge Instruction: Gently cleanse wound with antibacterial soap, rinse and pat dry prior to dressing wounds [name] ([id number]) [id number] Page 13 of 14 Wound Cleanser Discharge Instruction: Wash your hands with soap and water. Remove old dressing, discard into plastic bag and place into trash. Cleanse the wound with Wound Cleanser prior to applying a clean dressing using gauze sponges, not tissues or cotton balls. Do not scrub or use excessive force. Pat dry using gauze sponges, not tissue or cotton balls. Peri-Wound Care Topical Primary  Dressing Silvercel Small 2x2 (in/in) Discharge Instruction: Apply [place] Small 2x2 (in/in) as instructed Secondary Dressing T Adhesive [place] Dressing, 4x4 (in/in) elfa Discharge Instruction: Apply over dressing to secure in place. Secured With Compression Wrap Compression Stockings Facilities manager) Signed: 12/04/2022 3:10:23 PM By: Midge Aver MSN RN CNS WTA Entered By: Midge Aver on 12/04/2022 10:22:50 -------------------------------------------------------------------------------- Wound Assessment Details Patient Name: Date of Service: Chad [name], Chad [name] 12/04/2022 10:15 A M Medical Record Number: [id number] Patient Account Number: [id number] Date of Birth/Sex: Treating RN: 10-20-1960 (62 y.o. [name] Primary Care Rosaire Cueto: [name] Other Clinician: Referring Emmogene Simson: Treating Aven Christen/Extender: [name] Weeks in Treatment: 51 Wound Status Wound Number: 19 Primary Venous Leg Ulcer Etiology: Wound Location: Left, Posterior Lower Leg Wound Open Wounding Event: Gradually Appeared Status: Date Acquired: 11/10/2022 Comorbid Anemia, Chronic Obstructive Pulmonary Disease (COPD), Coronary Weeks Of Treatment: 3 History: Artery Disease, Peripheral Arterial Disease, Peripheral Venous Clustered Wound: No Disease, Hepatitis C, Osteoarthritis, Neuropathy, Received Chemotherapy Photos Wound Measurements Length: (cm) 1 Width: (cm) 0.5 Depth: (cm) 0.1 Area: (cm) 0.393 Volume: (cm) 0.039 % Reduction in Area: 44.4% % Reduction in Volume: 45.1% Epithelialization: None Wound Description Classification: Full Thickness Without Exposed Support Structures Wound Margin: Flat and Intact Exudate Amount: Medium Stein, Ellijah ([id number]) Exudate Type: Serous Exudate Color: amber Foul Odor After Cleansing: No Slough/Fibrino Yes [phone].pdf Page 14 of 14 Wound Bed Granulation Amount: None Present (0%) Exposed  Structure Necrotic Amount: Large (67-100%) Fascia Exposed: No Necrotic Quality: Adherent Slough Fat Layer (Subcutaneous Tissue) Exposed: Yes Tendon Exposed: No Muscle Exposed: No Joint Exposed: No Bone Exposed: No Treatment Notes Wound #19 (Lower Leg) Wound Laterality: Left, Posterior Cleanser Soap and Water Discharge Instruction: Gently cleanse wound with antibacterial soap, rinse and pat dry prior to dressing wounds Wound Cleanser Discharge Instruction: Wash your hands with soap and water.  Remove old dressing, discard into plastic bag and place into trash. Cleanse the wound with Wound Cleanser prior to applying a clean dressing using gauze sponges, not tissues or cotton balls. Do not scrub or use excessive force. Pat dry using gauze sponges, not tissue or cotton balls. Peri-Wound Care Topical Primary Dressing Secondary Dressing T Adhesive [place] Dressing, 4x4 (in/in) elfa Discharge Instruction: Apply over dressing to secure in place. Secured With Compression Wrap Compression Stockings Facilities manager) Signed: 12/04/2022 3:10:23 PM By: Midge Aver MSN RN CNS WTA Entered By: Midge Aver on 12/04/2022 10:23:20 -------------------------------------------------------------------------------- Vitals Details Patient Name: Date of Service: Chad [name] 12/04/2022 10:15 A M Medical Record Number: [id number] Patient Account Number: [id number] Date of Birth/Sex: Treating RN: 1961-06-26 (62 y.o. [name] Primary Care Kunio Cummiskey: [name] Other Clinician: Referring Emanuell Morina: Treating Darryon Bastin/Extender: [name] Weeks in Treatment: 51 Vital Signs Time Taken: 10:08 Temperature (F): 97.4 Height (in): 69 Pulse (bpm): 89 Weight (lbs): 150 Respiratory Rate (breaths/min): 16 Body Mass Index (BMI): 22.1 Blood Pressure (mmHg): 119/75 Reference Range: 80 - 120 mg / dl Electronic Signature(s) Signed: 12/04/2022 3:10:23 PM By: Midge Aver MSN  RN CNS WTA Entered By: Midge Aver on 12/04/2022 10:09:13

## 2022-12-04 NOTE — Progress Notes (Addendum)
Chad [name] ([id number]) [id number] Page 1 of 13 Visit Report for 12/04/2022 Chief Complaint Document Details Patient Name: Date of Service: Chad [name] 12/04/2022 10:15 A M Medical Record Number: [id number] Patient Account Number: [id number] Date of Birth/Sex: Treating RN: 07-19-1961 (62 y.o. Chad [name] Primary Care Provider: [name] Other Clinician: Referring Provider: Treating Provider/Extender: [name] Weeks in Treatment: 66 Information Obtained from: Patient Chief Complaint Left ankle ulcer, Left dorsal foot wound, right heel Electronic Signature(s) Signed: 12/04/2022 10:12:08 AM By: [name] PA-C Entered By: [name] on 12/04/2022 10:12:08 -------------------------------------------------------------------------------- HPI Details Patient Name: Date of Service: Chad [name] 12/04/2022 10:15 A M Medical Record Number: [id number] Patient Account Number: [id number] Date of Birth/Sex: Treating RN: 1960-10-06 (62 y.o. Chad [name] Primary Care Provider: [name] Other Clinician: Referring Provider: Treating Provider/Extender: [name] Weeks in Treatment: 85 History of Present Illness HPI Description: 10/08/18 on evaluation today patient actually presents to our office for initial evaluation concerning wounds that he has of the bilateral lower extremities. 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 He also has bilateral venous stasis. He apparently has been homeless currently following discharge from the hospital apparently he has been placed at [facility] which is is a skilled nursing facility locally. Nonetheless fortunately he does not show any signs of infection at this time which is good news. In fact several of the wound  actually appears to be showing some signs of improvement already in my pinion. There are a couple areas in the left leg in particular there likely gonna require some sharp debridement to help clear away some necrotic tissue and help with more sufficient healing. No fevers, chills, nausea, or vomiting noted at this time. 10/15/18 on evaluation today patient actually appears to be doing very well in regard to his bilateral lower extremities. He's been tolerating the dressing changes without complication. Fortunately there does not appear to be any evidence of active infection at this time which is great news. Overall I'm actually very pleased with how this has progressed in just one visits time. Readmission: 08/14/2020 upon evaluation today patient presents for re-evaluation here in our clinic. He is having issues with his left ankle region as well as his right toe and his right heel. He tells me that the toe and heel actually began as a area that was itching that he was scratching and then subsequently opened up into wounds. These may have been abscess areas I presume based on what I am seeing currently. With regard to his left ankle region he tells me this was a similar type occurrence although he does have venous stasis this very well may be more of a venous leg ulcer more than anything. Nonetheless I do believe that the patient would benefit from appropriate and aggressive wound care to try to help get things under better control here. He does have history of a stroke on the left side affecting him to some degree there that he is able to stand although he does have some residual weakness. Otherwise again the patient does have chronic venous insufficiency as previously noted. His arterial studies most recently obtained showed that he had an ABI on the right of 1.16 with a TBI of 0.52 and on the left and ABI of 1.14 with a TBI of 0.81. That was obtained on  06/19/2020. 08/28/2020 upon evaluation today  patient appears to be doing decently well in regard to his wounds in general. He has been tolerating the dressing changes without complication. Fortunately there does not appear to be any signs of active infection which is great news. With that being said I think the [facility] is doing a good job I would recommend that we likely continue with that currently. 09/11/2020 upon evaluation today patient's wounds did not appear to be doing too poorly but again he is not really showing signs of significant improvement with regard to any of the wounds on the right. None of them have Hydrofera Blue on them I am not exactly sure why this is not being followed as the facility did not contact us to let us know of any issues with obtaining dressings or otherwise. With that being said he is supposed to be using Hydrofera Blue on both of the wounds on the right foot as well as the ankle wound on the left side. 09/18/2020 upon evaluation today patient appears to be doing poorly with regard to his wounds. Again right now the left ankle in particular showed signs of extreme maceration. Apparently he was told by someone with staff at [facility] they could not get the [facility]. With that being said this is something that is never been relayed to [place] one way or another. Also the patient subsequently has not supposed to have a border gauze dressing on. He should have an ABD pad and roll gauze to secure as this drains much too much just to have a border gauze dressing to cover. Nonetheless the fact that they are not using the appropriate dressing is directly causing deterioration of the left ankle wound it is significantly worse today compared to what it was previous. I did attempt to call [facility] while the patient was here I called three times and got no one to even pick up the phone. After this I had my for an office coordinator call and she was able to finally get through and leave a message  with the D ON as of dictation of this note which is roughly about an hour and a half later I still have not been able to speak with anyone at the facility. 09/25/2020 upon evaluation today patient actually showing signs of good improvement which is excellent news. He has been tolerating the dressing changes ARUN, BAGGARLY ([id number]) [id number] Page 2 of 13 without complication. Fortunately there is no signs of active infection which is great news. No fevers, chills, nausea, vomiting, or diarrhea. I do feel like the facility has been doing a much better job at taking care of him as far as the dressings are concerned. However the director of nursing never did call me back. 10/09/2020 upon evaluation today patient appears to be doing well with regard to his wound. The toe ulcer did require some debridement but the other 2 areas actually appear to be doing quite well. 10/19/2020 upon evaluation today patient actually appears to be doing very well in regard to his wounds. In fact the heel does appear to be completely healed. The toe is doing better in the medial ankle on the left is also doing better. Overall I think he is headed in the right direction. 10/26/2020 upon evaluation today patient appears to be doing well with regard to his wound. He is showing signs of improvement which is great news and overall I am very pleased with where things stand today. No  fevers, chills, nausea, vomiting, or diarrhea. 11/02/2020 upon evaluation today patient appears to be doing well with regard to his wounds. He has been tolerating the dressing changes without complication overall I am extremely pleased with where things stand today. He in regard to the toe is almost completely healed and the medial ankle on the left is doing much better. 11/09/2020 upon evaluation today patient appears to be doing a little poorly in regard to his left medial ankle ulcer. Fortunately there does not appear to be  any signs of systemic infection but unfortunately locally he does appear to be infected in fact he has blue-green drainage consistent with Pseudomonas. 11/16/2020 upon evaluation today patient appears to be doing well with regard to his wound. It actually appears to be doing better. I did place him on gentamicin cream since the Cipro was actually resistant even though he was positive for Pseudomonas on culture. Overall I think that he does seem to be doing better though I am unsure whether or not they have actually been putting the cream on. The patient is not sure that we did talk to the nurse directly and she was going to initiate that treatment. Fortunately there does not appear to be any signs of active infection at this time. No fevers, chills, nausea, vomiting, or diarrhea. 4/28; the area on the right second toe is close to healed. Left medial ankle required debridement 12/07/2020 upon evaluation today patient appears to be doing well with regard to his wounds. In fact the right second toe appears to be completely healed which is great news. Fortunately there does not appear to be any signs of active infection at this time which is also great news. I think we can probably discontinue the gentamicin on top of everything else. 12/14/2020 upon evaluation today patient appears to be doing well with regard to his wound. He is making good progress and overall very pleased with where things stand today. There is no signs of active infection at this time which is great news. 12/28/2020 upon evaluation today patient appears to be doing well with regard to his wounds. He has been tolerating the dressing changes without complication. Fortunately there is no signs of active infection at this time. No fevers, chills, nausea, vomiting, or diarrhea. 12/28/2020 upon evaluation today patient's wound bed actually showed signs of excellent improvement. He has great epithelization and granulation I do not see any signs  of infection overall I am extremely pleased with where things stand at this point. No fevers, chills, nausea, vomiting, or diarrhea. 01/11/2021 upon evaluation today patient appears to be doing well with regard to his wound on his leg. He has been tolerating the dressing changes without complication. Fortunately there does not appear to be any signs of active infection which is great news. No fevers, chills, nausea, vomiting, or diarrhea. 01/25/2021 upon evaluation today patient appears to be doing well with regard to his wound. He has been tolerating the dressing changes without complication. Fortunately the collagen seems to be doing a great job which is excellent news. No fevers, chills, nausea, vomiting, or diarrhea. 02/08/2021 upon evaluation today patient's wound is actually looking a little bit worse especially in the periwound compared to previous. Fortunately there does not appear to be any signs of infection which is great news with that being said he does have some irritation around the periphery of the wound which has me more concerned. He actually had a dressing on that had not been changed in 3  days. He also is supposed to have daily dressing changes. With regard to the dressing applied he had a silver alginate dressing and silver collagen is what is recommended and ordered. He also had no Desitin around the edges of the wound in the periwound region although that is on the order inspect to be done as well. In general I was very concerned I did contact [facility] actually spoke with Verlon Au who is the scheduling individual and subsequently she stated that she would pass the information to the D ON apparently the D ON was not available to talk to me when I call today. 02/18/2021 upon evaluation today patient's wound is actually showing signs of improvement. Fortunately there does not appear to be any evidence of infection which is great news overall I am extremely pleased with where  things stand today. No fevers, chills, nausea, vomiting, or diarrhea. 8/3; patient presents for 1 week follow-up. He has no issues or complaints today. He denies signs of infection. 03/11/2021 upon evaluation today patient appears to be doing well with regard to his wound. He does have a little bit of slough noted on the surface of the wound but fortunately there does not appear to be any signs of active infection at this time. No fevers, chills, nausea, vomiting, or diarrhea. 03/18/2021 upon evaluation today patient appears to be doing well with regard to his wound. He has been tolerating the dressing changes without complication. There was a little irritation more proximal to where the wound was that was not noted last week but nonetheless this is very superficial just seems to be more irritation we just need to make sure to put a good amount of the zinc over the area in my opinion. Otherwise he does not seem to be doing significantly worse at all which is great news. 03/25/2021 upon evaluation today patient appears to be doing well with regard to his wound. He is going require some sharp debridement today to clear with some of the necrotic debris. I did perform this today without complication postdebridement wound bed appears to be doing much better this is great news. 04/08/2021 upon evaluation today patient appears to be doing decently well in regard to his wound although the overall measurement is not significantly smaller compared to previous. It is gone down a little bit but still the facility continues to not really put the appropriate dressings in place in fact he was supposed to have collagen we think he probably had more of an allergy to At this point. Fortunately there does not appear to be any signs of active infection systemically though locally I do not see anything on initial visualization either as far as erythema or warmth. 04/15/2021 upon evaluation today patient appears to be doing  well with regard to his wound. He is actually showing signs of improvement. I did place him on antibiotics last week, Cipro. He has been taking that 2 times a day and seems to be tolerating it very well. I do not see any evidence of worsening and in fact the overall appearance of the wound is smaller today which is also great news. 9/26; left medial ankle chronic venous insufficiency wound is improved. Using Hydrofera Blue 10/10; left medial ankle chronic venous insufficiency. Wound has not changed much in appearance completely nonviable surface. Apparently there have been problems getting the right product on the wound at the facility although he came in with [facility] on today 05/14/2021 upon evaluation today patient appears to be  doing well with regard to his wound. I think he is making progress here which is good news. Fortunately there does not appear to be any signs of active infection at this time. No fevers, chills, nausea, vomiting, or diarrhea. 05/20/2021 upon evaluation today patient appears to be doing well with regard to his wound. He is showing signs of good improvement which is great news. There does not appear to be any evidence of active infection which is also excellent news. No fevers, chills, nausea, vomiting, or diarrhea. 05/28/2021 upon evaluation today patient appears to be doing quite well. There does not appear to be any signs of active infection at this time which is great news. Overall I am extremely pleased with where things stand today. I think he is headed in the right direction. 06/11/2021 upon evaluation today patient appears to be doing well with regard to his left ankle ulcer and poorly in regard to the toe ulcer on the second toe right Chad [name], Chad [name] ([id number]) [id number] Page 3 of 13 foot. This appears to show signs of joint exposure. Apparently this has been present for 1 to 2 months although he kept forgetting to tell me about  it. That is unfortunate as right now it definitely appears to be doing significantly worse than what I would like to see. There does not appear to be any signs of active infection systemically though locally I am concerned about the possibility of infection the toe is quite red. Again no one from the facility ever contacted [place] to advise that this was going on in the interim either. 06/17/2021 upon evaluation today patient presents for follow-up I did review his x-ray which showed a navicular bone fracture I am unsure of the chronicity of this. Subsequently he also had osteomyelitis of the toe which was what I was more concerned about this did not show up on x-ray but did show up on the pathology scrapings. This was listed as acute osteomyelitis. Nonetheless at this point I think that the antibiotic treatment is the best regimen to go with currently. The patient is in agreement with that plan. Nonetheless he has initially 30 days of doxycycline off likely extend that towards the end of the treatment cycle that will be around the middle of December for an additional 2 weeks. That all depends on how well he continues to heal. Nonetheless based on what I am seeing in the foot I did want a proceed with an MRI as well which I think will be helpful to identify if there is anything else that needs to be addressed from the standpoint of infection. 06/24/2021 upon evaluation today patient appears to be doing pretty well in regards to his wounds. I think both are actually showing signs of improvement which is good I did review his MRI today which did show signs of osteomyelitis of the middle and proximal phalanx on his right foot of the affected toe. With that being said this is actually showing signs of significant improvement today already with the antibiotic therapy I think the redness is also improved. Overall I think that we just need to give this some time with appropriate wound care we will see how things  go potentially hyperbarics could be considered. 07/02/2021 upon inspection today patient actually appears to be doing well in regard to his left ankle which is getting very close to complete resolution of pleased in that regard. Unfortunately he is continuing to have issues with his second toe right foot and this seems  to still be very painful for him. Recommend he try something different from the standpoint of antibiotics. 07/15/2021 upon evaluation today patient appears to be doing actually pretty well in regard to his foot. This is actually showing signs of significant improvement which is great news. Overall I feel like the patient is improving both in regard to the second toe as well as the ankle on the left. With that being said the biggest issue that I do see currently is that he is needing to have a refill of the doxycycline that we previously treated him with. He also did see podiatry they are not going to recommend any amputation at this point since he seems to be doing quite well. For that reason we just need to keep things under control from an infection standpoint. 08/01/2021 upon evaluation today patient appears to be doing well with regard to his wound. He has been tolerating the dressing changes without complication. Fortunately there does not appear to be any evidence of active infection locally nor systemically at this point. In fact I think everything is doing excellent in fact his second toe on the right foot is almost healed and the ankle on the left ankle region is actually very close to being healed as well. 08/08/2021 upon evaluation today patient appears to be doing well with regard to his wound. He has been tolerating the dressing changes without complication. Fortunately I do not see any signs of active infection at this time. Readmission: 12-06-2021 upon evaluation today patient presents for reevaluation here in the clinic he does tell me that he was being seen in facility at  [facility] by a provider that was coming in. He is not sure who this was. He tells me however that the wound seems to have gotten worse even compared to where it was when we last saw him at this point. With that being said I do believe that he is likely going need ongoing wound care here in the clinic and I do believe that we need to be the ones to frontline this since his wound does seem to be getting worse not better at this point. He voiced understanding. He is also in agreement with this plan and feels more comfortable coming here she tells me. Patient's medical history really has not changed since his prior admission he was only gone since January. 12-27-2021 upon evaluation today patient appears to be doing well with regard to his wound they did run out of the [facility] so they did not put anything on just an ABD pad with gentamicin. Still we are seeing some signs of good improvement here with some new epithelization which is great news. 01-10-2022 upon evaluation today patient appears to be doing well with regard to his wounds and he is going require some sharp debridement but overall seems to be making good progress. Fortunately I do not see any evidence of active infection locally or systemically at this time which is great news. 01-24-2022 upon evaluation today patient appears to be doing well with regard to his wound. The facility actually came and dropped him off early and he had another appointment at the hospital and then they just brought him over here and this was still hours before his appointment this afternoon. For that reason we did do our best to work him in this morning and fortunately had some space to make this happen. With that being said patient's wound does seem to be making progress here and I  am very pleased in that regard I do not see any signs of active infection locally or systemically at this time. 02-07-2022 patient appears to be doing well currently in regard  to his wounds. In fact one of them the more proximal is healed the distal is still open but seems to be doing excellent. Fortunately I do not see any evidence of active infection locally or systemically at this time which is great news. No fevers, chills, nausea, vomiting, or diarrhea. 7/27; left medial ankle venous. Improving per our intake nurse. We are using Hydrofera Blue under Tubigrip compression. They are changing that at his facility 03-06-2022 upon evaluation today patient appears to be doing well with regard to the his wound he is can require some sharp debridement but seems to be making excellent progress. Fortunately I do not see any evidence of active infection locally or systemically which is great news. 03-27-2022 upon evaluation today patient's wound is actually showing signs of excellent improvement. Fortunately I see no signs of active infection locally or systemically at this time which is great news. No fevers, chills, nausea, vomiting, or diarrhea. 04-21-2022 upon evaluation today patient appears to be doing excellent in regard to his wound in fact this is very close to resolution based on what I am seeing. I do not see any evidence of active infection locally or systemically at this time which is great news and overall I am extremely pleased with where we are today. 05-05-2022 upon evaluation today patient's wound actually appears to potentially be completely healed. Fortunately I do not see any evidence of active infection at this time which is great news and overall I am very pleased I think this needs a little time to toughen up but other than that I really do believe were doing quite well. He is very pleased to hear this its been a long time coming. 05-19-2022 upon evaluation today patient appears to be doing well currently in regard to his wound. He in fact we were hoping will be completely healed and closed but it still has a very tiny area right in the center which is still  continuing to drain. Fortunately I do not see any signs of infection locally or systemically which is great news. 11/6; this wound is close to completely" he comes in this week with some erythema at 1 edge of this which looks like something was rubbing on the area. Other than that no evidence of infection 06-16-2022 upon evaluation today patient appears to be doing well currently in regard to his ankle ulcer. This is very close to being healed but still has a small area in the central portion which is not. Fortunately there does not appear to be any signs of infection locally or systemically which is great news. No fevers, chills, nausea, vomiting, or diarrhea. 06-30-2022 upon evaluation patient's wound pretty much appears to be almost completely closed. In fact it may even be closed but there are still a lot of skin irritation around the edges of the wound and I just do not feel great about releasing them yet I would like to continue to monitor this just a little bit longer before getting to that point. He is not opposed to this and in fact is happy to continue to come in if need be in order to make sure that things are moving in the right direction he definitely does not want to backtrack. Chad [name], Chad [name] ([id number]) [id number] Page 4 of 13 07-14-2022 upon evaluation today patient  appears to be doing well currently in regard to his wound. Has been tolerating the dressing changes without complication. Fortunately I see no evidence of active infection locally nor systemically which is great news. No fevers, chills, nausea, vomiting, or diarrhea. 08-11-2022 upon evaluation today patient appears to be doing a little worse than last time I saw him although it has been a month. Unfortunately he was not able to be seen due to the fact that he was quarantined in the facility with COVID. Subsequently he tells me that he mention to the facility staff several times about taking it  off over the past several weeks but they continue to tell him that someone have to get in touch with [place] here although nobody ever did until Friday when the nurse manager called to have that documented in the communication notes. With that being said unfortunately this means that he had a wrap on that he was supposed to have on for only 1 week for ended up being on four 1 month essentially. I am glad that there is nothing worse than what we see currently to be perfectly honest. 08-19-2022 upon evaluation today patient appears to be doing well currently in regard to his wounds. He has been tolerating the dressing changes without complication. This is actually smaller today which is great news after last week's reopening. Fortunately I do not see any evidence of infection locally nor systemically at this point. 08-26-2022 upon evaluation today patient appears to be doing well currently in regard to his wound. He has been tolerating the dressing changes without complication. Fortunately there does not appear to be any signs of active infection locally nor systemically which is great news. No fevers, chills, nausea, vomiting, or diarrhea. 09-02-2022 upon evaluation today patient's wound actually showing signs of excellent improvement this is measuring smaller and looking much better. I am very pleased with where we stand I do believe that we are headed in the right direction. 09-09-2022 upon evaluation today patient appears to be doing decently well in regard to his leg in general although there was some swelling this is the 1 thing that I am not too happy with based on what I see currently. Fortunately there does not appear to be any signs of infection locally nor systemically at this point. 09-16-2022 upon evaluation today patient appears to be doing well currently in regard to his wound. He is actually showing signs of improvement he wore the Tubigrip this week and it significantly better compared to last  week's evaluation. Fortunately I do not see any evidence of active infection locally or systemically which is great news. Overall I think that were headed in the right direction which is great news. In fact overall I think this is the best that he has looked in some time. He does not like the Tubigrip but actually did extremely well with this in my opinion. I think he needs to continue to utilize this. 09-23-2022 upon evaluation today patient appears to be doing well currently in regard to his wound. He has been tolerating the dressing changes without complication. Fortunately there does not appear to be any signs of active infection locally nor systemically which is great news and in general I do feel like that we are headed in the right direction. He has been having trouble with the Tubigrip however we have gotten the facility to order compression socks he has not gotten them as of yet. 3/4; patient is about the same with a wound  on the left posterior leg. This is a venous wound. He needs more compression on this leg but he took off the previous wraps we are using. I am not certain whether he is using juxta lite stockings at the nursing home. 3/12; patient presents for follow-up. He unfortunately has 2 new wounds 1 to the dorsal left side and the other to the right heel. He is not sure how these happened. The right heel appears to be caused by pressure. He has been using Hydrofera Blue to the original wound on the left leg. 10-14-2022 upon evaluation today patient appears to be doing well currently in regard to his original wound but unfortunately he has several new wounds which I was completely unaware of before walking into the room to see him today. Unfortunately he seems to not be doing nearly as well as what I saw when I last had an appointment with him 3 weeks ago. Fortunately I do not see any signs of systemic infection though unfortunately locally he has definite infection of the left lower  extremity. He is currently on doxycycline I am going to likely have to extend that today. 10-21-2022 upon evaluation today patient appears to be doing well currently in regard to his wounds. He seems to be tolerating the dressing changes without complication. Fortunately there does not appear to be any signs of active infection locally or systemically which is great news and in general I definitely feel like we are on the right track here. 10-30-2022 upon evaluation today patient still continues to not be doing too well at all with regard to his wounds. Unfortunately he is still having signs of infection as well which also has been concerned. Fortunately I do not see any signs of active infection locally nor systemically at this time. No fevers, chills, nausea, vomiting, or diarrhea. 11-06-2022 upon evaluation today patient appears to be doing okay currently in regard to his wounds. All things considered with his blood flow not being nearly as good as what should I think that he is making pretty good progress here. Fortunately I do not see any signs of infection locally nor systemically which is great news. No fevers, chills, nausea, vomiting, or diarrhea. 11-13-2022 upon evaluation today patient appears to be doing poorly currently in regard to his wounds. Things seem to be getting a little bit worse not better. 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 Subsequently he is also going to need to have vascular intervention but we are unsure exactly when this is going on the undertaking. I would try to actually send a message to Dr. Hazle Quant and the vascular team just to see what exactly the plan is going forward. The patient is in agreement with that plan I just think we need to have a plan especially in light of the fact the left leg seems to be getting a little bit worse at this point. 11-25-2022 upon  evaluation today patient is currently now discharged from the hospital following his colostomy procedure. That did very well and he seems to be doing well. Fortunately there does not appear to be any signs of active infection locally or systemically which is great news. No fevers, chills, nausea, vomiting, or diarrhea. 12-04-2022 upon evaluation today patient appears to be doing actually decently well in regard to his wounds and actually very pleased with where we stand and I think that  he is making some pretty good progress here. Fortunately I do not see any signs of active infection locally nor systemically which is great news. No fevers, chills, nausea, vomiting, or diarrhea. Electronic Signature(s) Signed: 12/04/2022 11:13:30 AM By: [name] PA-C Entered By: [name] on 12/04/2022 11:13:30 -------------------------------------------------------------------------------- Physical Exam Details Patient Name: Date of Service: Chad [name], Chad [name] 12/04/2022 10:15 A M Medical Record Number: [id number] Patient Account Number: [id number] Date of Birth/Sex: Treating RN: Feb 17, 1961 (62 y.o. Chad [name] Primary Care Provider: [name] Other Clinician: Referring Provider: Treating Provider/Extender: [name] Cowan, [name] ([id number]) [id number] Page 5 of 13 Weeks in Treatment: 13 Constitutional Well-nourished and well-hydrated in no acute distress. Respiratory normal breathing without difficulty. Psychiatric this patient is able to make decisions and demonstrates good insight into disease process. Alert and Oriented x 3. pleasant and cooperative. Notes Upon inspection patient's wound bed actually showed signs again at all sites of doing well he is actually going to be having his intervention for vascular flow coming up shortly which is great news. Overall I think that he is moving in the right direction. Electronic Signature(s) Signed:  12/04/2022 11:13:46 AM By: [name] PA-C Entered By: [name] on 12/04/2022 11:13:45 -------------------------------------------------------------------------------- Physician Orders Details Patient Name: Date of Service: Chad [name], Chad [name] 12/04/2022 10:15 A M Medical Record Number: [id number] Patient Account Number: [id number] Date of Birth/Sex: Treating RN: [birth year] (62 y.o. 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 Bathing/ Shower/ Hygiene May shower with wound dressing protected with water repellent cover or cast protector. No tub bath. Anesthetic (Use 'Patient Medications' Section for Anesthetic Order Entry) Lidocaine applied to wound bed Edema Control - Lymphedema / Segmental Compressive Device / Other Elevate legs to the level of the heart and pump ankles as often as possible Elevate leg(s) parallel to the floor when sitting. Off-Loading Heel suspension boot - when in bed Medications-Please add to medication list. Other: - apply AandD ointment to leg, do not apply to wound Wound Treatment Wound #12 - Ankle Wound Laterality: Left, Medial, Distal Cleanser: Soap and Water Every Other Day/30 Days Discharge Instructions: Gently cleanse wound with antibacterial soap, rinse and pat dry prior to dressing wounds Cleanser: Wound Cleanser  Every Other Day/30 Days Discharge Instructions: Wash your hands with soap and water. Remove old dressing, discard into plastic bag and place into trash. Cleanse the wound with Wound Cleanser prior to applying a clean dressing using gauze sponges, not tissues or cotton balls. Do not scrub or use excessive force. Pat dry using gauze sponges, not tissue or cotton balls. Prim Dressing: Silvercel Small 2x2 (in/in) Every Other Day/30 Days ary Discharge Instructions: Apply Silvercel Small 2x2 (in/in) as instructed Chad [name], Chad [name] ([id number]) [id number] Page 6 of 13 Secondary Dressing: T Adhesive T[employer], 4x4 (in/in) Every Other Day/30 Days elfa Discharge Instructions: Apply over dressing to secure in place. Wound #15 - Foot Wound Laterality: Dorsal, Left Cleanser: Soap and Water Every Other Day/30 Days Discharge Instructions: Gently cleanse  wound with antibacterial soap, rinse and pat dry prior to dressing wounds Cleanser: Wound Cleanser Every Other Day/30 Days Discharge Instructions: Wash your hands with soap and water. Remove old dressing, discard into plastic bag and place into trash. Cleanse the wound with Wound Cleanser prior to applying a clean dressing using gauze sponges, not tissues or cotton balls. Do not scrub or use excessive force. Pat dry using gauze sponges, not tissue or cotton balls. Prim Dressing: Silvercel Small 2x2 (in/in) Every Other Day/30 Days ary Discharge Instructions: Apply Silvercel Small 2x2 (in/in) as instructed Secondary Dressing: T Adhesive [employer], 4x4 (in/in) Every Other Day/30 Days elfa Discharge Instructions: Apply over dressing to secure in place. Wound #16 - Calcaneus Wound Laterality: Right Cleanser: Soap and Water Every Other Day/30 Days Discharge Instructions: Gently cleanse wound with antibacterial soap, rinse and pat dry prior to dressing wounds Cleanser: Wound Cleanser Every Other Day/30 Days Discharge  Instructions: Wash your hands with soap and water. Remove old dressing, discard into plastic bag and place into trash. Cleanse the wound with Wound Cleanser prior to applying a clean dressing using gauze sponges, not tissues or cotton balls. Do not scrub or use excessive force. Pat dry using gauze sponges, not tissue or cotton balls. Prim Dressing: Silvercel Small 2x2 (in/in) Every Other Day/30 Days ary Discharge Instructions: Apply Silvercel Small 2x2 (in/in) as instructed Secondary Dressing: T Adhesive Toll [employer], 4x4 (in/in) Every Other Day/30 Days elfa Discharge Instructions: Apply over dressing to secure in place. Wound #17 - Lower Leg Wound Laterality: Left, Anterior Cleanser: Soap and Water Every Other Day/30 Days Discharge Instructions: Gently cleanse wound with antibacterial soap, rinse and pat dry prior to dressing wounds Cleanser: Wound Cleanser Every Other Day/30 Days Discharge Instructions: Wash your hands with soap and water. Remove old dressing, discard into plastic bag and place into trash. Cleanse the wound with Wound Cleanser prior to applying a clean dressing using gauze sponges, not tissues or cotton balls. Do not scrub or use excessive force. Pat dry using gauze sponges, not tissue or cotton balls. Prim Dressing: Silvercel Small 2x2 (in/in) Every Other Day/30 Days ary Discharge Instructions: Apply Silvercel Small 2x2 (in/in) as instructed Secondary Dressing: T Adhesive Toll [employer], 4x4 (in/in) Every Other Day/30 Days elfa Discharge Instructions: Apply over dressing to secure in place. Wound #18 - T Second oe Wound Laterality: Right, Medial Cleanser: Soap and Water Every Other Day/30 Days Discharge Instructions: Gently cleanse wound with antibacterial soap, rinse and pat dry prior to dressing wounds Cleanser: Wound Cleanser Every Other Day/30 Days Discharge Instructions: Wash your hands with soap and water. Remove old dressing, discard into plastic bag and  place into trash. Cleanse the wound with Wound Cleanser prior to applying a clean dressing using gauze sponges, not tissues or cotton balls. Do not scrub or use excessive force. Pat dry using gauze sponges, not tissue or cotton balls. Prim Dressing: Silvercel Small 2x2 (in/in) Every Other Day/30 Days ary Discharge Instructions: Apply Silvercel Small 2x2 (in/in) as instructed Secondary Dressing: T Adhesive Toll [employer], 4x4 (in/in) Every Other Day/30 Days elfa Discharge Instructions: Apply over dressing to secure in place. Wound #19 - Lower Leg Wound Laterality: Left, Posterior Cleanser: Soap and Water Every Other Day/30 Days Discharge Instructions: Gently cleanse wound with antibacterial soap, rinse and pat dry prior to dressing wounds Cleanser: Wound Cleanser Every Other Day/30 Days Discharge Instructions: Wash your hands with soap and water. Remove old dressing, discard into plastic bag and place into trash. Cleanse the wound with  Wound Cleanser prior to applying a clean dressing using gauze sponges, not tissues or cotton balls. Do not scrub or use excessive force. Pat dry using gauze sponges, not tissue or cotton balls. Prim Dressing: Silvercel Small 2x2 (in/in) Every Other Day/30 Days ary Discharge Instructions: Apply Silvercel Small 2x2 (in/in) as instructed Chad [name], Chad [name] ([id number]) [id number] Page 7 of 13 Secondary Dressing: T Adhesive Toll [employer], 4x4 (in/in) Every Other Day/30 Days elfa Discharge Instructions: Apply over dressing to secure in place. Electronic Signature(s) Signed: 12/04/2022 3:10:23 PM By: Midge Aver MSN RN CNS WTA Signed: 12/04/2022 4:04:13 PM By: [name] PA-C Entered By: Midge Aver on 12/04/2022 11:09:53 -------------------------------------------------------------------------------- Problem List Details Patient Name: Date of Service: Chad [name], Chad [name] 12/04/2022 10:15 A M Medical Record Number: [id number] Patient  Account Number: [id number] Date of Birth/Sex: Treating RN: 10-Aug-1960 (62 y.o. 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 Chad [name] Primary Care Provider: [name] Other Clinician: Referring Provider: Treating Provider/Extender: [name] Weeks in Treatment: [address] Subjective Chief Complaint TREMARION, DEHN ([id number]) [id number] Page 8 of 13 Information obtained from Patient Left ankle ulcer, Left dorsal foot wound, right heel History of Present Illness (HPI) 10/08/18 on evaluation today patient actually presents to our office for initial evaluation concerning wounds that he has of the bilateral lower extremities. 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 He also has bilateral venous stasis. He apparently has been homeless currently following discharge from the hospital apparently he has been placed at [facility] which is is a skilled nursing facility locally. Nonetheless fortunately he does not show any signs of infection at this time which is good news. In fact several of the wound actually appears to be showing some signs of improvement already in my pinion. There are a couple areas in the left leg in particular there likely gonna require some sharp debridement to help clear away some necrotic tissue and help with more sufficient healing. No fevers, chills, nausea, or vomiting noted at this time. 10/15/18 on evaluation today patient actually appears to be doing very well in regard to his bilateral lower extremities. He's been tolerating the dressing changes without complication. Fortunately there does not appear to be any evidence of active infection at this time which is great news. Overall I'm actually very pleased with how this has progressed in just one visits time. Readmission: 08/14/2020 upon evaluation today patient presents for re-evaluation here in our clinic. He is having issues with his left  ankle region as well as his right toe and his right heel. He tells me that the toe and heel actually began as a area that was itching that he was scratching and then subsequently opened up into wounds. These may have been abscess areas I presume based on what I am seeing currently. With regard to his left ankle region he tells me this was a similar type occurrence although he does have venous stasis this very well may be more of a venous leg ulcer more than anything. Nonetheless I do believe that the patient would benefit from appropriate and aggressive wound care to try to help get things under better control here. He does have history of a  stroke on the left side affecting him to some degree there that he is able to stand although he does have some residual weakness. Otherwise again the patient does have chronic venous insufficiency as previously noted. His arterial studies most recently obtained showed that he had an ABI on the right of 1.16 with a TBI of 0.52 and on the left and ABI of 1.14 with a TBI of 0.81. That was obtained on 06/19/2020. 08/28/2020 upon evaluation today patient appears to be doing decently well in regard to his wounds in general. He has been tolerating the dressing changes without complication. Fortunately there does not appear to be any signs of active infection which is great news. With that being said I think the [facility] is doing a good job I would recommend that we likely continue with that currently. 09/11/2020 upon evaluation today patient's wounds did not appear to be doing too poorly but again he is not really showing signs of significant improvement with regard to any of the wounds on the right. None of them have Hydrofera Blue on them I am not exactly sure why this is not being followed as the facility did not contact us to let us know of any issues with obtaining dressings or otherwise. With that being said he is supposed to be using Hydrofera Blue on both of the wounds on the right foot as well as the ankle wound on the left side. 09/18/2020 upon evaluation today patient appears to be doing poorly with regard to his wounds. Again right now the left ankle in particular showed signs of extreme maceration. Apparently he was told by someone with staff at [facility] they could not get the [facility]. With that being said this is something that is never been relayed to [place] one way or another. Also the patient subsequently has not supposed to have a border gauze dressing on. He should have an ABD pad and roll gauze to secure as this drains much too much just to have a border gauze dressing to  cover. Nonetheless the fact that they are not using the appropriate dressing is directly causing deterioration of the left ankle wound it is significantly worse today compared to what it was previous. I did attempt to call [facility] while the patient was here I called three times and got no one to even pick up the phone. After this I had my for an office coordinator call and she was able to finally get through and leave a message with the D ON as of dictation of this note which is roughly about an hour and a half later I still have not been able to speak with anyone at the facility. 09/25/2020 upon evaluation today patient actually showing signs of good improvement which is excellent  news. He has been tolerating the dressing changes without complication. Fortunately there is no signs of active infection which is great news. No fevers, chills, nausea, vomiting, or diarrhea. I do feel like the facility has been doing a much better job at taking care of him as far as the dressings are concerned. However the director of nursing never did call me back. 10/09/2020 upon evaluation today patient appears to be doing well with regard to his wound. The toe ulcer did require some debridement but the other 2 areas actually appear to be doing quite well. 10/19/2020 upon evaluation today patient actually appears to be doing very well in regard to his wounds. In fact the heel does appear to be completely healed. The toe is doing better in the medial ankle on the left is also doing better. Overall I think he is headed in the right direction. 10/26/2020 upon evaluation today patient appears to be doing well with regard to his wound. He is showing signs of improvement which is great news and overall I am very pleased with where things stand today. No fevers, chills, nausea, vomiting, or diarrhea. 11/02/2020 upon evaluation today patient appears to be doing well with regard to his wounds. He has been tolerating the  dressing changes without complication overall I am extremely pleased with where things stand today. He in regard to the toe is almost completely healed and the medial ankle on the left is doing much better. 11/09/2020 upon evaluation today patient appears to be doing a little poorly in regard to his left medial ankle ulcer. Fortunately there does not appear to be any signs of systemic infection but unfortunately locally he does appear to be infected in fact he has blue-green drainage consistent with Pseudomonas. 11/16/2020 upon evaluation today patient appears to be doing well with regard to his wound. It actually appears to be doing better. I did place him on gentamicin cream since the Cipro was actually resistant even though he was positive for Pseudomonas on culture. Overall I think that he does seem to be doing better though I am unsure whether or not they have actually been putting the cream on. The patient is not sure that we did talk to the nurse directly and she was going to initiate that treatment. Fortunately there does not appear to be any signs of active infection at this time. No fevers, chills, nausea, vomiting, or diarrhea. 4/28; the area on the right second toe is close to healed. Left medial ankle required debridement 12/07/2020 upon evaluation today patient appears to be doing well with regard to his wounds. In fact the right second toe appears to be completely healed which is great news. Fortunately there does not appear to be any signs of active infection at this time which is also great news. I think we can probably discontinue the gentamicin on top of everything else. 12/14/2020 upon evaluation today patient appears to be doing well with regard to his wound. He is making good progress and overall very pleased with where things stand today. There is no signs of active infection at this time which is great news. 12/28/2020 upon evaluation today patient appears to be doing well with  regard to his wounds. He has been tolerating the dressing changes without complication. Fortunately there is no signs of active infection at this time. No fevers, chills, nausea, vomiting, or diarrhea. 12/28/2020 upon evaluation today patient's wound bed actually showed signs of excellent improvement. He has great epithelization and granulation I do  not see any signs of infection overall I am extremely pleased with where things stand at this point. No fevers, chills, nausea, vomiting, or diarrhea. 01/11/2021 upon evaluation today patient appears to be doing well with regard to his wound on his leg. He has been tolerating the dressing changes without complication. Fortunately there does not appear to be any signs of active infection which is great news. No fevers, chills, nausea, vomiting, or diarrhea. 01/25/2021 upon evaluation today patient appears to be doing well with regard to his wound. He has been tolerating the dressing changes without complication. Fortunately the collagen seems to be doing a great job which is excellent news. No fevers, chills, nausea, vomiting, or diarrhea. CIEL, ARRENDONDO ([id number]) [id number] Page 9 of 13 02/08/2021 upon evaluation today patient's wound is actually looking a little bit worse especially in the periwound compared to previous. Fortunately there does not appear to be any signs of infection which is great news with that being said he does have some irritation around the periphery of the wound which has me more concerned. He actually had a dressing on that had not been changed in 3 days. He also is supposed to have daily dressing changes. With regard to the dressing applied he had a silver alginate dressing and silver collagen is what is recommended and ordered. He also had no Desitin around the edges of the wound in the periwound region although that is on the order inspect to be done as well. In general I was very concerned I did contact  [facility] actually spoke with Verlon Au who is the scheduling individual and subsequently she stated that she would pass the information to the D ON apparently the D ON was not available to talk to me when I call today. 02/18/2021 upon evaluation today patient's wound is actually showing signs of improvement. Fortunately there does not appear to be any evidence of infection which is great news overall I am extremely pleased with where things stand today. No fevers, chills, nausea, vomiting, or diarrhea. 8/3; patient presents for 1 week follow-up. He has no issues or complaints today. He denies signs of infection. 03/11/2021 upon evaluation today patient appears to be doing well with regard to his wound. He does have a little bit of slough noted on the surface of the wound but fortunately there does not appear to be any signs of active infection at this time. No fevers, chills, nausea, vomiting, or diarrhea. 03/18/2021 upon evaluation today patient appears to be doing well with regard to his wound. He has been tolerating the dressing changes without complication. There was a little irritation more proximal to where the wound was that was not noted last week but nonetheless this is very superficial just seems to be more irritation we just need to make sure to put a good amount of the zinc over the area in my opinion. Otherwise he does not seem to be doing significantly worse at all which is great news. 03/25/2021 upon evaluation today patient appears to be doing well with regard to his wound. He is going require some sharp debridement today to clear with some of the necrotic debris. I did perform this today without complication postdebridement wound bed appears to be doing much better this is great news. 04/08/2021 upon evaluation today patient appears to be doing decently well in regard to his wound although the overall measurement is not significantly smaller compared to previous. It is gone down a  little bit but still the facility  continues to not really put the appropriate dressings in place in fact he was supposed to have collagen we think he probably had more of an allergy to At this point. Fortunately there does not appear to be any signs of active infection systemically though locally I do not see anything on initial visualization either as far as erythema or warmth. 04/15/2021 upon evaluation today patient appears to be doing well with regard to his wound. He is actually showing signs of improvement. I did place him on antibiotics last week, Cipro. He has been taking that 2 times a day and seems to be tolerating it very well. I do not see any evidence of worsening and in fact the overall appearance of the wound is smaller today which is also great news. 9/26; left medial ankle chronic venous insufficiency wound is improved. Using Hydrofera Blue 10/10; left medial ankle chronic venous insufficiency. Wound has not changed much in appearance completely nonviable surface. Apparently there have been problems getting the right product on the wound at the facility although he came in with [facility] on today 05/14/2021 upon evaluation today patient appears to be doing well with regard to his wound. I think he is making progress here which is good news. Fortunately there does not appear to be any signs of active infection at this time. No fevers, chills, nausea, vomiting, or diarrhea. 05/20/2021 upon evaluation today patient appears to be doing well with regard to his wound. He is showing signs of good improvement which is great news. There does not appear to be any evidence of active infection which is also excellent news. No fevers, chills, nausea, vomiting, or diarrhea. 05/28/2021 upon evaluation today patient appears to be doing quite well. There does not appear to be any signs of active infection at this time which is great news. Overall I am extremely pleased with where things stand  today. I think he is headed in the right direction. 06/11/2021 upon evaluation today patient appears to be doing well with regard to his left ankle ulcer and poorly in regard to the toe ulcer on the second toe right foot. This appears to show signs of joint exposure. Apparently this has been present for 1 to 2 months although he kept forgetting to tell me about it. That is unfortunate as right now it definitely appears to be doing significantly worse than what I would like to see. There does not appear to be any signs of active infection systemically though locally I am concerned about the possibility of infection the toe is quite red. Again no one from the facility ever contacted [place] to advise that this was going on in the interim either. 06/17/2021 upon evaluation today patient presents for follow-up I did review his x-ray which showed a navicular bone fracture I am unsure of the chronicity of this. Subsequently he also had osteomyelitis of the toe which was what I was more concerned about this did not show up on x-ray but did show up on the pathology scrapings. This was listed as acute osteomyelitis. Nonetheless at this point I think that the antibiotic treatment is the best regimen to go with currently. The patient is in agreement with that plan. Nonetheless he has initially 30 days of doxycycline off likely extend that towards the end of the treatment cycle that will be around the middle of December for an additional 2 weeks. That all depends on how well he continues to heal. Nonetheless based on what I am seeing in  the foot I did want a proceed with an MRI as well which I think will be helpful to identify if there is anything else that needs to be addressed from the standpoint of infection. 06/24/2021 upon evaluation today patient appears to be doing pretty well in regards to his wounds. I think both are actually showing signs of improvement which is good I did review his MRI today which did show  signs of osteomyelitis of the middle and proximal phalanx on his right foot of the affected toe. With that being said this is actually showing signs of significant improvement today already with the antibiotic therapy I think the redness is also improved. Overall I think that we just need to give this some time with appropriate wound care we will see how things go potentially hyperbarics could be considered. 07/02/2021 upon inspection today patient actually appears to be doing well in regard to his left ankle which is getting very close to complete resolution of pleased in that regard. Unfortunately he is continuing to have issues with his second toe right foot and this seems to still be very painful for him. Recommend he try something different from the standpoint of antibiotics. 07/15/2021 upon evaluation today patient appears to be doing actually pretty well in regard to his foot. This is actually showing signs of significant improvement which is great news. Overall I feel like the patient is improving both in regard to the second toe as well as the ankle on the left. With that being said the biggest issue that I do see currently is that he is needing to have a refill of the doxycycline that we previously treated him with. He also did see podiatry they are not going to recommend any amputation at this point since he seems to be doing quite well. For that reason we just need to keep things under control from an infection standpoint. 08/01/2021 upon evaluation today patient appears to be doing well with regard to his wound. He has been tolerating the dressing changes without complication. Fortunately there does not appear to be any evidence of active infection locally nor systemically at this point. In fact I think everything is doing excellent in fact his second toe on the right foot is almost healed and the ankle on the left ankle region is actually very close to being healed as well. 08/08/2021 upon  evaluation today patient appears to be doing well with regard to his wound. He has been tolerating the dressing changes without complication. Fortunately I do not see any signs of active infection at this time. Readmission: 12-06-2021 upon evaluation today patient presents for reevaluation here in the clinic he does tell me that he was being seen in facility at [facility] by a provider that was coming in. He is not sure who this was. He tells me however that the wound seems to have gotten worse even compared to where it was when we last saw him at this point. With that being said I do believe that he is likely going need ongoing wound care here in the clinic and I do believe that we need to be the ones to frontline this since his wound does seem to be getting worse not better at this point. He voiced understanding. He is also in agreement with this plan and feels more comfortable coming here she tells me. Patient's medical history really has not changed since his prior admission he was only gone since January. ARGUS, VANDECAR ([id number]) [id number] Page  10 of 13 12-27-2021 upon evaluation today patient appears to be doing well with regard to his wound they did run out of the [facility] so they did not put anything on just an ABD pad with gentamicin. Still we are seeing some signs of good improvement here with some new epithelization which is great news. 01-10-2022 upon evaluation today patient appears to be doing well with regard to his wounds and he is going require some sharp debridement but overall seems to be making good progress. Fortunately I do not see any evidence of active infection locally or systemically at this time which is great news. 01-24-2022 upon evaluation today patient appears to be doing well with regard to his wound. The facility actually came and dropped him off early and he had another appointment at the hospital and then they just brought  him over here and this was still hours before his appointment this afternoon. For that reason we did do our best to work him in this morning and fortunately had some space to make this happen. With that being said patient's wound does seem to be making progress here and I am very pleased in that regard I do not see any signs of active infection locally or systemically at this time. 02-07-2022 patient appears to be doing well currently in regard to his wounds. In fact one of them the more proximal is healed the distal is still open but seems to be doing excellent. Fortunately I do not see any evidence of active infection locally or systemically at this time which is great news. No fevers, chills, nausea, vomiting, or diarrhea. 7/27; left medial ankle venous. Improving per our intake nurse. We are using Hydrofera Blue under Tubigrip compression. They are changing that at his facility 03-06-2022 upon evaluation today patient appears to be doing well with regard to the his wound he is can require some sharp debridement but seems to be making excellent progress. Fortunately I do not see any evidence of active infection locally or systemically which is great news. 03-27-2022 upon evaluation today patient's wound is actually showing signs of excellent improvement. Fortunately I see no signs of active infection locally or systemically at this time which is great news. No fevers, chills, nausea, vomiting, or diarrhea. 04-21-2022 upon evaluation today patient appears to be doing excellent in regard to his wound in fact this is very close to resolution based on what I am seeing. I do not see any evidence of active infection locally or systemically at this time which is great news and overall I am extremely pleased with where we are today. 05-05-2022 upon evaluation today patient's wound actually appears to potentially be completely healed. Fortunately I do not see any evidence of active infection at this time which  is great news and overall I am very pleased I think this needs a little time to toughen up but other than that I really do believe were doing quite well. He is very pleased to hear this its been a long time coming. 05-19-2022 upon evaluation today patient appears to be doing well currently in regard to his wound. He in fact we were hoping will be completely healed and closed but it still has a very tiny area right in the center which is still continuing to drain. Fortunately I do not see any signs of infection locally or systemically which is great news. 11/6; this wound is close to completely" he comes in this week with some erythema at 1 edge  of this which looks like something was rubbing on the area. Other than that no evidence of infection 06-16-2022 upon evaluation today patient appears to be doing well currently in regard to his ankle ulcer. This is very close to being healed but still has a small area in the central portion which is not. Fortunately there does not appear to be any signs of infection locally or systemically which is great news. No fevers, chills, nausea, vomiting, or diarrhea. 06-30-2022 upon evaluation patient's wound pretty much appears to be almost completely closed. In fact it may even be closed but there are still a lot of skin irritation around the edges of the wound and I just do not feel great about releasing them yet I would like to continue to monitor this just a little bit longer before getting to that point. He is not opposed to this and in fact is happy to continue to come in if need be in order to make sure that things are moving in the right direction he definitely does not want to backtrack. 07-14-2022 upon evaluation today patient appears to be doing well currently in regard to his wound. Has been tolerating the dressing changes without complication. Fortunately I see no evidence of active infection locally nor systemically which is great news. No fevers, chills,  nausea, vomiting, or diarrhea. 08-11-2022 upon evaluation today patient appears to be doing a little worse than last time I saw him although it has been a month. Unfortunately he was not able to be seen due to the fact that he was quarantined in the facility with COVID. Subsequently he tells me that he mention to the facility staff several times about taking it off over the past several weeks but they continue to tell him that someone have to get in touch with [place] here although nobody ever did until Friday when the nurse manager called to have that documented in the communication notes. With that being said unfortunately this means that he had a wrap on that he was supposed to have on for only 1 week for ended up being on four 1 month essentially. I am glad that there is nothing worse than what we see currently to be perfectly honest. 08-19-2022 upon evaluation today patient appears to be doing well currently in regard to his wounds. He has been tolerating the dressing changes without complication. This is actually smaller today which is great news after last week's reopening. Fortunately I do not see any evidence of infection locally nor systemically at this point. 08-26-2022 upon evaluation today patient appears to be doing well currently in regard to his wound. He has been tolerating the dressing changes without complication. Fortunately there does not appear to be any signs of active infection locally nor systemically which is great news. No fevers, chills, nausea, vomiting, or diarrhea. 09-02-2022 upon evaluation today patient's wound actually showing signs of excellent improvement this is measuring smaller and looking much better. I am very pleased with where we stand I do believe that we are headed in the right direction. 09-09-2022 upon evaluation today patient appears to be doing decently well in regard to his leg in general although there was some swelling this is the 1 thing that I am not too  happy with based on what I see currently. Fortunately there does not appear to be any signs of infection locally nor systemically at this point. 09-16-2022 upon evaluation today patient appears to be doing well currently in regard to his wound.  He is actually showing signs of improvement he wore the Tubigrip this week and it significantly better compared to last week's evaluation. Fortunately I do not see any evidence of active infection locally or systemically which is great news. Overall I think that were headed in the right direction which is great news. In fact overall I think this is the best that he has looked in some time. He does not like the Tubigrip but actually did extremely well with this in my opinion. I think he needs to continue to utilize this. 09-23-2022 upon evaluation today patient appears to be doing well currently in regard to his wound. He has been tolerating the dressing changes without complication. Fortunately there does not appear to be any signs of active infection locally nor systemically which is great news and in general I do feel like that we are headed in the right direction. He has been having trouble with the Tubigrip however we have gotten the facility to order compression socks he has not gotten them as of yet. 3/4; patient is about the same with a wound on the left posterior leg. This is a venous wound. He needs more compression on this leg but he took off the previous wraps we are using. I am not certain whether he is using juxta lite stockings at the nursing home. 3/12; patient presents for follow-up. He unfortunately has 2 new wounds 1 to the dorsal left side and the other to the right heel. He is not sure how these happened. The right heel appears to be caused by pressure. He has been using Hydrofera Blue to the original wound on the left leg. 10-14-2022 upon evaluation today patient appears to be doing well currently in regard to his original wound but  unfortunately he has several new wounds which I was completely unaware of before walking into the room to see him today. Unfortunately he seems to not be doing nearly as well as what I saw when I last had Chad [name], Chad [name] ([id number]) [id number] Page 11 of 13 an appointment with him 3 weeks ago. Fortunately I do not see any signs of systemic infection though unfortunately locally he has definite infection of the left lower extremity. He is currently on doxycycline I am going to likely have to extend that today. 10-21-2022 upon evaluation today patient appears to be doing well currently in regard to his wounds. He seems to be tolerating the dressing changes without complication. Fortunately there does not appear to be any signs of active infection locally or systemically which is great news and in general I definitely feel like we are on the right track here. 10-30-2022 upon evaluation today patient still continues to not be doing too well at all with regard to his wounds. Unfortunately he is still having signs of infection as well which also has been concerned. Fortunately I do not see any signs of active infection locally nor systemically at this time. No fevers, chills, nausea, vomiting, or diarrhea. 11-06-2022 upon evaluation today patient appears to be doing okay currently in regard to his wounds. All things considered with his blood flow not being nearly as good as what should I think that he is making pretty good progress here. Fortunately I do not see any signs of infection locally nor systemically which is great news. No fevers, chills, nausea, vomiting, or diarrhea. 11-13-2022 upon evaluation today patient appears to be doing poorly currently in regard to his wounds. Things seem to be getting a little bit worse  not better. 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 Subsequently  he is also going to need to have vascular intervention but we are unsure exactly when this is going on the undertaking. I would try to actually send a message to Dr. Hazle Quant and the vascular team just to see what exactly the plan is going forward. The patient is in agreement with that plan I just think we need to have a plan especially in light of the fact the left leg seems to be getting a little bit worse at this point. 11-25-2022 upon evaluation today patient is currently now discharged from the hospital following his colostomy procedure. That did very well and he seems to be doing well. Fortunately there does not appear to be any signs of active infection locally or systemically which is great news. No fevers, chills, nausea, vomiting, or diarrhea. 12-04-2022 upon evaluation today patient appears to be doing actually decently well in regard to his wounds and actually very pleased with where we stand and I think that he is making some pretty good progress here. Fortunately I do not see any signs of active infection locally nor systemically which is great news. No fevers, chills, nausea, vomiting, or diarrhea. Objective Constitutional Well-nourished and well-hydrated in no acute distress. Vitals Time Taken: 10:08 AM, Height: 69 in, Weight: 150 lbs, BMI: 22.1, Temperature: 97.4 F, Pulse: 89 bpm, Respiratory Rate: 16 breaths/min, Blood Pressure: 119/75 mmHg. Respiratory normal breathing without difficulty. Psychiatric this patient is able to make decisions and demonstrates good insight into disease process. Alert and Oriented x 3. pleasant and cooperative. General Notes: Upon inspection patient's wound bed actually showed signs again at all sites of doing well he is actually going to be having his intervention for vascular flow coming up shortly which is great news. Overall I think that he is moving in the right direction. Integumentary (Hair, Skin) Wound #12 status is Open. Original cause  of wound was Gradually Appeared. The date acquired was: 07/12/2019. The wound has been in treatment 51 weeks. The wound is located on the Left,Distal,Medial Ankle. The wound measures 2.5cm length x 1cm width x 0.2cm depth; 1.963cm^2 area and 0.393cm^3 volume. There is Fat Layer (Subcutaneous Tissue) exposed. There is a medium amount of serosanguineous drainage noted. The wound margin is distinct with the outline attached to the wound base. There is small (1-33%) pink granulation within the wound bed. There is a large (67-100%) amount of necrotic tissue within the wound bed including Adherent Slough. Wound #15 status is Open. Original cause of wound was Gradually Appeared. The date acquired was: 10/01/2022. The wound has been in treatment 8 weeks. The wound is located on the Left,Dorsal Foot. The wound measures 3.5cm length x 1.2cm width x 0.2cm depth; 3.299cm^2 area and 0.66cm^3 volume. There is Fat Layer (Subcutaneous Tissue) exposed. There is a medium amount of serosanguineous drainage noted. There is small (1-33%) red granulation within the wound bed. There is a large (67-100%) amount of necrotic tissue within the wound bed including Adherent Slough. Wound #16 status is Open. Original cause of wound was Gradually Appeared. The date acquired was: 09/26/2022. The wound has been in treatment 8 weeks. The wound is located on the Right Calcaneus. The wound measures 0.7cm length x 1cm width x 0.1cm depth; 0.55cm^2 area and 0.055cm^3 volume. There is Fat Layer (Subcutaneous Tissue) exposed. There is a medium amount of  serosanguineous drainage noted. There is medium (34-66%) red granulation within the wound bed. There is a medium (34-66%) amount of necrotic tissue within the wound bed including Adherent Slough. Wound #17 status is Open. Original cause of wound was Pressure Injury. The date acquired was: 09/30/2022. The wound has been in treatment 7 weeks. The wound is located on the Left,Anterior Lower Leg. The  wound measures 3.2cm length x 2.3cm width x 0.2cm depth; 5.781cm^2 area and 1.156cm^3 volume. There is Fat Layer (Subcutaneous Tissue) exposed. There is a medium amount of serosanguineous drainage noted. There is small (1-33%) red granulation within the wound bed. There is a large (67-100%) amount of necrotic tissue within the wound bed including Eschar. Wound #18 status is Open. Original cause of wound was Pressure Injury. The date acquired was: 09/26/2022. The wound has been in treatment 7 weeks. The wound is located on the Right,Medial T Second. The wound measures 0.3cm length x 0.3cm width x 0.1cm depth; 0.071cm^2 area and 0.007cm^3 volume. oe There is a medium amount of serosanguineous drainage noted. Wound #19 status is Open. Original cause of wound was Gradually Appeared. The date acquired was: 11/10/2022. The wound has been in treatment 3 weeks. The wound is located on the Left,Posterior Lower Leg. The wound measures 1cm length x 0.5cm width x 0.1cm depth; 0.393cm^2 area and 0.039cm^3 volume. There is Fat Layer (Subcutaneous Tissue) exposed. There is a medium amount of serous drainage noted. The wound margin is flat and intact. There is no granulation within the wound bed. There is a large (67-100%) amount of necrotic tissue within the wound bed including Adherent Slough. 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 Bathing/ Shower/ Hygiene: May shower with wound dressing protected with water repellent cover  or cast protector. No tub bath. Anesthetic (Use 'Patient Medications' Section for Anesthetic Order Entry): Lidocaine applied to wound bed Edema Control - Lymphedema / Segmental Compressive Device / Other: Elevate legs to the level of the heart and pump ankles as often as possible Elevate leg(s) parallel to the floor when sitting. Off-Loading: Heel suspension boot - when in bed Medications-Please add to medication list.: Other: - apply AandD ointment to leg, do not apply to wound WOUND #12: - Ankle Wound Laterality: Left, Medial, Distal Cleanser: Soap and Water Every Other Day/30 Days Discharge Instructions: Gently cleanse wound with antibacterial soap, rinse and pat dry prior to dressing wounds Cleanser: Wound Cleanser Every Other Day/30 Days Discharge Instructions: Wash your hands with soap and water. Remove old dressing, discard into plastic bag and place into trash. Cleanse the wound with Wound Cleanser prior to applying a clean dressing using gauze sponges, not tissues or cotton balls. Do not scrub or use excessive force. Pat dry using gauze sponges, not tissue or cotton balls. Prim Dressing: Silvercel Small 2x2 (in/in) Every Other Day/30 Days ary Discharge Instructions: Apply Silvercel Small 2x2 (in/in) as instructed Secondary Dressing: T Adhesive Toll Brothers, 4x4 (in/in) Every Other Day/30 Days elfa Discharge Instructions: Apply over dressing to secure in place. WOUND #15: - Foot Wound Laterality: Dorsal, Left Cleanser: Soap and Water Every Other Day/30 Days Discharge Instructions: Gently cleanse  wound with antibacterial soap, rinse and pat dry prior to dressing wounds Cleanser: Wound Cleanser Every Other Day/30 Days Discharge Instructions: Wash your hands with soap and water. Remove old dressing, discard into plastic bag and place into trash. Cleanse the wound with Wound Cleanser prior to applying a clean dressing using gauze sponges, not tissues or cotton balls. Do not scrub  or use excessive force. Pat dry using gauze sponges, not tissue or cotton balls. Prim Dressing: Silvercel Small 2x2 (in/in) Every Other Day/30 Days ary Discharge Instructions: Apply Silvercel Small 2x2 (in/in) as instructed Secondary Dressing: T Adhesive T[employer], 4x4 (in/in) Every Other Day/30 Days elfa Discharge Instructions: Apply over dressing to secure in place. WOUND #16: - Calcaneus Wound Laterality: Right Cleanser: Soap and Water Every Other Day/30 Days Discharge Instructions: Gently cleanse wound with antibacterial soap, rinse and pat dry prior to dressing wounds Cleanser: Wound Cleanser Every Other Day/30 Days Discharge Instructions: Wash your hands with soap and water. Remove old dressing, discard into plastic bag and place into trash. Cleanse the wound with Wound Cleanser prior to applying a clean dressing using gauze sponges, not tissues or cotton balls. Do not scrub or use excessive force. Pat dry using gauze sponges, not tissue or cotton balls. Prim Dressing: Silvercel Small 2x2 (in/in) Every Other Day/30 Days ary Discharge Instructions: Apply Silvercel Small 2x2 (in/in) as instructed Secondary Dressing: T Adhesive T[employer], 4x4 (in/in) Every Other Day/30 Days elfa Discharge Instructions: Apply over dressing to secure in place. WOUND #17: - Lower Leg Wound Laterality: Left, Anterior Cleanser: Soap and Water Every Other Day/30 Days Discharge Instructions: Gently cleanse wound with antibacterial soap, rinse and pat dry prior to dressing wounds Cleanser: Wound Cleanser Every Other Day/30 Days Discharge Instructions: Wash your hands with soap and water. Remove old dressing, discard into plastic bag and place into trash. Cleanse the wound with Wound Cleanser prior to applying a clean dressing using gauze sponges, not tissues or cotton balls. Do not scrub or use excessive force. Pat dry using gauze sponges, not tissue or cotton balls. Prim Dressing: Silvercel Small  2x2 (in/in) Every Other Day/30 Days ary Discharge Instructions: Apply Silvercel Small 2x2 (in/in) as instructed Secondary Dressing: T Adhesive Toll [employer], 4x4 (in/in) Every Other Day/30 Days elfa Discharge Instructions: Apply over dressing to secure in place. WOUND #18: - T Second Wound Laterality: Right, Medial oe Cleanser: Soap and Water Every Other Day/30 Days Discharge Instructions: Gently cleanse wound with antibacterial soap, rinse and pat dry prior to dressing wounds Cleanser: Wound Cleanser Every Other Day/30 Days Discharge Instructions: Wash your hands with soap and water. Remove old dressing, discard into plastic bag and place into trash. Cleanse the wound with Wound Cleanser prior to applying a clean dressing using gauze sponges, not tissues or cotton balls. Do not scrub or use excessive force. Pat dry using gauze sponges, not tissue or cotton balls. Prim Dressing: Silvercel Small 2x2 (in/in) Every Other Day/30 Days ary Discharge Instructions: Apply Silvercel Small 2x2 (in/in) as instructed Secondary Dressing: T Adhesive Toll [employer], 4x4 (in/in) Every Other Day/30 Days elfa Discharge Instructions: Apply over dressing to secure in place. WOUND #19: - Lower Leg Wound Laterality: Left, Posterior Cleanser: Soap and Water Every Other Day/30 Days Discharge Instructions: Gently cleanse wound with antibacterial soap, rinse and pat dry prior to dressing wounds Cleanser: Wound Cleanser Every Other Day/30 Days MANA, REUTER ([id number]) [id number] Page 13 of 13 Discharge Instructions: Wash your hands with soap and water. Remove old dressing, discard into plastic bag  and place into trash. Cleanse the wound with Wound Cleanser prior to applying a clean dressing using gauze sponges, not tissues or cotton balls. Do not scrub or use excessive force. Pat dry using gauze sponges, not tissue or cotton balls. Prim Dressing: Silvercel Small 2x2 (in/in) Every  Other Day/30 Days ary Discharge Instructions: Apply Silvercel Small 2x2 (in/in) as instructed Secondary Dressing: T Adhesive Toll [employer], 4x4 (in/in) Every Other Day/30 Days elfa Discharge Instructions: Apply over dressing to secure in place. 1. Would recommend that we have the patient continue to monitor for any signs of infection or worsening. Based on what I am seeing I do believe that we are making excellent progress here. 2. Multiple recommend the patient should continue to utilize the silver alginate dressings in particular which seems to be doing quite well. We will see patient back for reevaluation in 1 week here in the clinic. If anything worsens or changes patient will contact our office for additional recommendations. Electronic Signature(s) Signed: 12/04/2022 11:14:06 AM By: [name] PA-C Entered By: [name] on 12/04/2022 11:14:06 -------------------------------------------------------------------------------- SuperBill Details Patient Name: Date of Service: LONDELL, MADRIL 12/04/2022 Medical Record Number: [id number] Patient Account Number: [id number] Date of Birth/Sex: Treating RN: 1961-04-16 (62 y.o. Chad [name] Primary Care Provider: [name] Other Clinician: Referring Provider: Treating Provider/Extender: [name] Weeks in Treatment: 51 Diagnosis Coding ICD-10 Codes Code Description [phone] Chronic venous hypertension (idiopathic) with ulcer and inflammation of left lower extremity 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 side 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, stage 3 Facility Procedures : CPT4 Code: [id number] Description: 62130 - WOUND CARE VISIT-LEV 5 EST PT Modifier: Quantity: 1 Physician Procedures : CPT4 Code  Description Modifier [id number] - WC PHYS LEVEL 3 - EST PT ICD-10 Diagnosis Description 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 (idiopathic) with ulcer and inflammation of left lower extremity L97.322 Non-pressure chronic ulcer of left ankle  with fat layer exposed L98.492 Non-pressure chronic ulcer of skin of other sites with fat layer exposed 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 side Quantity: 1 Electronic Signature(s) Signed: 12/04/2022 11:14:24 AM By: [name] PA-C Entered By: [name] on 12/04/2022 11:14:23

## 2022-12-05 MED FILL — Iron Sucrose Inj 20 MG/ML (Fe Equiv): INTRAVENOUS | Qty: 10 | Status: AC

## 2022-12-08 ENCOUNTER — Inpatient Hospital Stay: Payer: Medicaid Other

## 2022-12-08 ENCOUNTER — Encounter: Payer: Self-pay | Admitting: Internal Medicine

## 2022-12-08 ENCOUNTER — Inpatient Hospital Stay (HOSPITAL_BASED_OUTPATIENT_CLINIC_OR_DEPARTMENT_OTHER): Payer: Medicaid Other | Admitting: Internal Medicine

## 2022-12-08 VITALS — BP 108/74 | HR 84 | Temp 98.0°F | Ht 67.0 in | Wt 155.0 lb

## 2022-12-08 DIAGNOSIS — Z5112 Encounter for antineoplastic immunotherapy: Secondary | ICD-10-CM | POA: Diagnosis present

## 2022-12-08 DIAGNOSIS — C187 Malignant neoplasm of sigmoid colon: Secondary | ICD-10-CM | POA: Diagnosis not present

## 2022-12-08 DIAGNOSIS — C661 Malignant neoplasm of right ureter: Secondary | ICD-10-CM | POA: Diagnosis not present

## 2022-12-08 DIAGNOSIS — Z515 Encounter for palliative care: Secondary | ICD-10-CM

## 2022-12-08 DIAGNOSIS — Z026 Encounter for examination for insurance purposes: Secondary | ICD-10-CM

## 2022-12-08 DIAGNOSIS — Z7189 Other specified counseling: Secondary | ICD-10-CM

## 2022-12-08 DIAGNOSIS — D509 Iron deficiency anemia, unspecified: Secondary | ICD-10-CM | POA: Diagnosis not present

## 2022-12-08 DIAGNOSIS — Z8546 Personal history of malignant neoplasm of prostate: Secondary | ICD-10-CM | POA: Diagnosis not present

## 2022-12-08 DIAGNOSIS — Z79620 Long term (current) use of immunosuppressive biologic: Secondary | ICD-10-CM | POA: Diagnosis not present

## 2022-12-08 DIAGNOSIS — F17210 Nicotine dependence, cigarettes, uncomplicated: Secondary | ICD-10-CM | POA: Diagnosis not present

## 2022-12-08 LAB — CBC WITH DIFFERENTIAL/PLATELET
Abs Immature Granulocytes: 0.01 10*3/uL (ref 0.00–0.07)
Basophils Absolute: 0 10*3/uL (ref 0.0–0.1)
Basophils Relative: 1 %
Eosinophils Absolute: 0.4 10*3/uL (ref 0.0–0.5)
Eosinophils Relative: 6 %
HCT: 40.1 % (ref 39.0–52.0)
Hemoglobin: 13 g/dL (ref 13.0–17.0)
Immature Granulocytes: 0 %
Lymphocytes Relative: 21 %
Lymphs Abs: 1.4 10*3/uL (ref 0.7–4.0)
MCH: 27.4 pg (ref 26.0–34.0)
MCHC: 32.4 g/dL (ref 30.0–36.0)
MCV: 84.6 fL (ref 80.0–100.0)
Monocytes Absolute: 0.5 10*3/uL (ref 0.1–1.0)
Monocytes Relative: 7 %
Neutro Abs: 4.5 10*3/uL (ref 1.7–7.7)
Neutrophils Relative %: 65 %
Platelets: 199 10*3/uL (ref 150–400)
RBC: 4.74 MIL/uL (ref 4.22–5.81)
RDW: 17 % — ABNORMAL HIGH (ref 11.5–15.5)
WBC: 6.8 10*3/uL (ref 4.0–10.5)
nRBC: 0 % (ref 0.0–0.2)

## 2022-12-08 LAB — COMPREHENSIVE METABOLIC PANEL
ALT: 20 U/L (ref 0–44)
AST: 39 U/L (ref 15–41)
Albumin: 3.4 g/dL — ABNORMAL LOW (ref 3.5–5.0)
Alkaline Phosphatase: 119 U/L (ref 38–126)
Anion gap: 10 (ref 5–15)
BUN: 21 mg/dL (ref 8–23)
CO2: 23 mmol/L (ref 22–32)
Calcium: 8.6 mg/dL — ABNORMAL LOW (ref 8.9–10.3)
Chloride: 104 mmol/L (ref 98–111)
Creatinine, Ser: 1.09 mg/dL (ref 0.61–1.24)
GFR, Estimated: >60 mL/min (ref >60)
Glucose, Bld: 118 mg/dL — ABNORMAL HIGH (ref 70–99)
Potassium: 3.8 mmol/L (ref 3.5–5.1)
Sodium: 137 mmol/L (ref 135–145)
Total Bilirubin: 0.4 mg/dL (ref 0.3–1.2)
Total Protein: 7.6 g/dL (ref 6.5–8.1)

## 2022-12-08 LAB — TSH: TSH: 1.655 u[IU]/mL (ref 0.350–4.500)

## 2022-12-08 MED ORDER — SODIUM CHLORIDE 0.9 % IV SOLN
200.0000 mg | Freq: Once | INTRAVENOUS | Status: AC
  Administered 2022-12-08: 200 mg via INTRAVENOUS
  Filled 2022-12-08: qty 200

## 2022-12-08 MED ORDER — HEPARIN SOD (PORK) LOCK FLUSH 100 UNIT/ML IV SOLN
500.0000 [IU] | Freq: Once | INTRAVENOUS | Status: AC | PRN
  Administered 2022-12-08: 500 [IU]
  Filled 2022-12-08: qty 5

## 2022-12-08 MED ORDER — SODIUM CHLORIDE 0.9 % IV SOLN
Freq: Once | INTRAVENOUS | Status: AC
  Filled 2022-12-08: qty 250

## 2022-12-08 NOTE — Progress Notes (Signed)
Wants to know if we can take out his staple?

## 2022-12-08 NOTE — Progress Notes (Signed)
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  - ELEVEN LYMPH NODES, NEGATIVE FOR MALIGNANCY (0/11).  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  Is alone/in a wheelchair.  Chad [name] 62 y.o.  male above history of stage IV-ureteral cancer/history of stage III colon cancer right side; recurrent sigmoid colon cancer [un-resected/under surveillance] and multiple  other comorbidities currently on Keytruda is here for follow-up.  In the interim patient underwent partial colectomy/surgery for sigmoid cancer.  Patient has recovered fairly well.  Patient currently has staples in place.   Denies any nausea. No diarrhea. Feels some fatigue. Chronic pain in left leg.  Denies any worsening abdominal pain.  No blood in stools or black-colored stools.  No hematuria.  Chronic shortness of breath.  Not any worse.  6  Review of Systems  Constitutional:  Positive for malaise/fatigue. Negative for chills, diaphoresis, fever and weight loss.  HENT:  Negative for nosebleeds and sore throat.   Eyes:  Negative for double vision.  Respiratory:  Positive for cough and shortness of breath. Negative for hemoptysis and wheezing.   Cardiovascular:  Negative for chest pain, palpitations and orthopnea.  Gastrointestinal:  Positive for constipation. Negative for abdominal pain, blood in stool, diarrhea, heartburn, melena, nausea and vomiting.  Genitourinary:  Negative for dysuria, frequency and urgency.  Musculoskeletal:  Positive for back pain and joint pain.  Skin: Negative.  Negative for itching and rash.  Neurological:  Positive for focal weakness. Negative for dizziness, tingling, weakness and headaches.       Chronic left-sided weakness upper than lower extremity.  Endo/Heme/Allergies:  Does not bruise/bleed easily.  Psychiatric/Behavioral:  Negative for depression. The patient is not nervous/anxious and does not have insomnia.      MEDICAL HISTORY:  Past Medical History:  Diagnosis Date   Anemia    Anxiety    ARF (acute respiratory failure) (HCC)    Atherosclerosis of arteries of extremities (HCC)    Bladder cancer (HCC)    BPH with obstruction/lower urinary tract symptoms    Colon  cancer (HCC)    COPD (chronic obstructive pulmonary disease) (HCC)    Depression    Dysphagia    Family history of colon cancer    Family history of kidney cancer    Family  history of leukemia    Family history of prostate cancer    GERD (gastroesophageal reflux disease)    Hepatitis    chronic hep c   Hydronephrosis    Hydronephrosis with ureteral stricture    Hyperlipidemia    Knee pain    Left   Malignant neoplasm of colon (HCC)    Malnutrition (HCC)    Nerve pain    Peripheral vascular disease (HCC)    Pressure ulcer    Prostate cancer (HCC)    Stroke (HCC)    Ureteral cancer, right (HCC)    Urinary frequency    Venous hypertension of both lower extremities     SURGICAL HISTORY: Past Surgical History:  Procedure Laterality Date   COLON SURGERY     En bloc extended right hemicolectomy 07/2017   COLONOSCOPY WITH PROPOFOL N/A 11/06/2020   Procedure: COLONOSCOPY WITH PROPOFOL;  Surgeon: Wyline Mood, MD;  Location[facility] ENDOSCOPY;  Service: Gastroenterology;  Laterality: N/A;   COLONOSCOPY WITH PROPOFOL N/A 07/31/2021   Procedure: COLONOSCOPY WITH PROPOFOL;  Surgeon: Earline Mayotte, MD;  Location: [facility] ENDOSCOPY;  Service: Endoscopy;  Laterality: N/A;   CYSTOSCOPY W/ RETROGRADES Right 08/30/2018   Procedure: CYSTOSCOPY WITH RETROGRADE PYELOGRAM;  Surgeon: Vanna Scotland, MD;  Location: [facility] ORS;  Service: Urology;  Laterality: Right;   CYSTOSCOPY WITH STENT PLACEMENT Right 04/25/2018   Procedure: CYSTOSCOPY WITH STENT PLACEMENT;  Surgeon: Vanna Scotland, MD;  Location: [facility] ORS;  Service: Urology;  Laterality: Right;   CYSTOSCOPY WITH STENT PLACEMENT Right 08/30/2018   Procedure: CYSTOSCOPY WITH STENT Exchange;  Surgeon: Vanna Scotland, MD;  Location: [facility] ORS;  Service: Urology;  Laterality: Right;   CYSTOSCOPY WITH STENT PLACEMENT Right 03/07/2019   Procedure: CYSTOSCOPY WITH STENT Exchange;  Surgeon: Vanna Scotland, MD;  Location: [facility] ORS;  Service: Urology;  Laterality: Right;   CYSTOSCOPY WITH STENT PLACEMENT Right 11/21/2019   Procedure: CYSTOSCOPY WITH STENT Exchange;  Surgeon: Vanna Scotland, MD;  Location: [facility] ORS;  Service:  Urology;  Laterality: Right;   LOWER EXTREMITY ANGIOGRAPHY Left 05/23/2019   Procedure: LOWER EXTREMITY ANGIOGRAPHY;  Surgeon: Annice Needy, MD;  Location: [facility] INVASIVE CV LAB;  Service: Cardiovascular;  Laterality: Left;   LOWER EXTREMITY ANGIOGRAPHY Right 05/30/2019   Procedure: LOWER EXTREMITY ANGIOGRAPHY;  Surgeon: Annice Needy, MD;  Location: [facility] INVASIVE CV LAB;  Service: Cardiovascular;  Laterality: Right;   LOWER EXTREMITY ANGIOGRAPHY Right 02/13/2020   Procedure: LOWER EXTREMITY ANGIOGRAPHY;  Surgeon: Annice Needy, MD;  Location: [facility] INVASIVE CV LAB;  Service: Cardiovascular;  Laterality: Right;   LOWER EXTREMITY ANGIOGRAPHY Left 02/20/2020   Procedure: LOWER EXTREMITY ANGIOGRAPHY;  Surgeon: Annice Needy, MD;  Location: [facility] INVASIVE CV LAB;  Service: Cardiovascular;  Laterality: Left;   PORTA CATH INSERTION N/A 02/28/2019   Procedure: PORTA CATH INSERTION;  Surgeon: Annice Needy, MD;  Location: [facility] INVASIVE CV LAB;  Service: Cardiovascular;  Laterality: N/A;   ROBOT ASSISTED LAPAROSCOPIC PARTIAL COLECTOMY  11/17/2022   tumor removed       SOCIAL HISTORY: Social History   Socioeconomic History   Marital status: Single    Spouse name: Not on file   Number of children: Not on file   Years of education: Not on file   Highest  education level: Not on file  Occupational History   Not on file  Tobacco Use   Smoking status: Every Day    Packs/day: .5    Types: Cigarettes   Smokeless tobacco: Never  Vaping Use   Vaping Use: Never used  Substance and Sexual Activity   Alcohol use: Not Currently   Drug use: Not Currently    Types: Marijuana, Methylphenidate    Comment: quit 1997-1998 ish   Sexual activity: Not Currently  Other Topics Concern   Not on file  Social History Narrative    used to live [place]; moved  To [place]- end of April 2019; in Nursing home; 1pp/day; quit alcohol. 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        Family- dad- prostate ca [at 78y]; brother- 20 died of  prostate cancer; brother- 28- no cancers [[place]]; sonThayer Ohm [[place]];[name]-32y prostate ca [facility] [place]]; daughter- 79 [[place]]; another daughter 99 [[place]/addict]. will refer genetics counseling. Given MSI- abnormal; highly suspicious of Lynch syndrome.  Patient's son Cristal Deer aware of high possible lynch syndrome.   Social Determinants of Health   Financial Resource Strain: Not on file  Food Insecurity: No Food Insecurity (11/17/2022)   Hunger Vital Sign    Worried About Running Out of Food in the Last Year: Never true    Ran Out of Food in the Last Year: Never true  Transportation Needs: No Transportation Needs (11/17/2022)   PRAPARE - Administrator, Civil Service (Medical): No    Lack of Transportation (Non-Medical): No  Physical Activity: Not on file  Stress: Not on file  Social Connections: Not on file  Intimate Partner Violence: Not At Risk (11/17/2022)   Humiliation, Afraid, Rape, and Kick questionnaire    Fear of Current or Ex-Partner: No    Emotionally Abused: No    Physically Abused: No    Sexually Abused: No    FAMILY HISTORY: Family History  Problem Relation Age of Onset   Prostate cancer Father 19   Cancer Brother 7       unsure type   Cancer Paternal Uncle        unsure type   Cancer Maternal Grandmother        unsure type   Cancer Paternal Grandmother        unsure type   Kidney cancer Paternal Grandfather    Cancer Other        unsure types   Leukemia Son    Cancer Son        other cancers, possibly colon    ALLERGIES:  is allergic to penicillins.  MEDICATIONS:  Current Outpatient Medications  Medication Sig Dispense Refill   acetaminophen (TYLENOL) 325 MG tablet Take 650 mg by mouth 3 (three) times daily.      aspirin EC 81 MG tablet Take 1 tablet (81 mg total) by mouth daily. 150 tablet 2   Calcium Carb-Cholecalciferol (CALCIUM CARBONATE-VITAMIN D3 PO) Take 2 tablets by mouth daily. 600-400 mg     carboxymethylcellulose 1 % ophthalmic  solution Apply 1 drop to eye 3 (three) times daily. Left eye     clopidogrel (PLAVIX) 75 MG tablet Take 75 mg by mouth daily.     cyclobenzaprine (FEXMID) 7.5 MG tablet Take 15 mg by mouth at bedtime.     escitalopram (LEXAPRO) 5 MG tablet Take 5 mg by mouth daily.     gabapentin (NEURONTIN) 100 MG capsule Take 100 mg by mouth 3 (three) times daily.  mirtazapine (REMERON) 7.5 MG tablet Take 7.5 mg by mouth at bedtime.     oxycodone (OXY-IR) 5 MG capsule Take 10 mg by mouth every 6 (six) hours.     senna (SENOKOT) 8.6 MG TABS tablet Take 1 tablet by mouth at bedtime.     senna-docusate (SENOKOT-S) 8.6-50 MG tablet Take 1 tablet by mouth daily.     tamsulosin (FLOMAX) 0.4 MG CAPS capsule Take 1 capsule (0.4 mg total) by mouth daily after supper. 30 capsule 3   traMADol (ULTRAM) 50 MG tablet Take 1 tablet (50 mg total) by mouth every 6 (six) hours as needed. 20 tablet 0   Amino Acids-Protein Hydrolys (PRO-STAT) LIQD Take 45 mLs by mouth daily.     atorvastatin (LIPITOR) 10 MG tablet Take 1 tablet (10 mg total) by mouth daily. 30 tablet 11   collagenase (SANTYL) 250 UNIT/GM ointment Apply 1 Application topically daily as needed. (Patient not taking: Reported on 12/08/2022)     No current facility-administered medications for this visit.   Facility-Administered Medications Ordered in Other Visits  Medication Dose Route Frequency Provider Last Rate Last Admin   heparin lock flush 100 UNIT/ML injection            heparin lock flush 100 unit/mL  500 Units Intracatheter Once PRN Earna Coder, MD       pembrolizumab [facility]) 200 mg in sodium chloride 0.9 % 50 mL chemo infusion  200 mg Intravenous Once Louretta Shorten R, MD          .  PHYSICAL EXAMINATION: ECOG PERFORMANCE STATUS: 1 - Symptomatic but completely ambulatory  Vitals:   12/08/22 1423  BP: 108/74  Pulse: 84  Temp: 98 F (36.7 C)  SpO2: 95%      Filed Weights   12/08/22 1423  Weight: 155 lb (70.3 kg)      Left lower extremity erythema/swelling-cellulitis.  Physical Exam HENT:     Head: Normocephalic and atraumatic.     Mouth/Throat:     Pharynx: No oropharyngeal exudate.  Eyes:     Pupils: Pupils are equal, round, and reactive to light.  Cardiovascular:     Rate and Rhythm: Normal rate and regular rhythm.  Pulmonary:     Effort: No respiratory distress.     Breath sounds: No wheezing.  Abdominal:     General: Bowel sounds are normal. There is no distension.     Palpations: Abdomen is soft. There is no mass.     Tenderness: There is no abdominal tenderness. There is no guarding or rebound.  Musculoskeletal:        General: No tenderness. Normal range of motion.     Cervical back: Normal range of motion and neck supple.  Skin:    General: Skin is warm.     Comments: Bilateral lower extremity ulcerations noted.  Pulses intact bilaterally.  Cystlike lesion noted right elbow-nontender.  No erythema noted.  Neurological:     Mental Status: He is oriented to person, place, and time.     Comments: Sleepy but easily arousable.  Chronic weakness left upper and lower extremity.  Psychiatric:        Mood and Affect: Affect normal.      LABORATORY DATA:  I have reviewed the data as listed Lab Results  Component Value Date   WBC 6.8 12/08/2022   HGB 13.0 12/08/2022   HCT 40.1 12/08/2022   MCV 84.6 12/08/2022   PLT 199 12/08/2022   Recent Labs  10/09/22 1610 10/28/22 0754 11/04/22 1221 11/18/22 0420 11/19/22 0603 12/08/22 1413  NA 133* 135   < > 137 135 137  K 4.2 4.0   < > 4.4 4.0 3.8  CL 102 107   < > 106 109 104  CO2 24 24   < > 23 23 23   GLUCOSE 112* 91   < > 127* 85 118*  BUN 21 32*   < > 14 14 21   CREATININE 1.20 1.16   < > 0.99 0.86 1.09  CALCIUM 8.2* 8.5*   < > 8.2* 7.7* 8.6*  GFRNONAA >60 >60   < > >60 >60 >60  PROT 7.6 7.7  --   --   --  7.6  ALBUMIN 3.1* 3.2*  --   --   --  3.4*  AST 36 35  --   --   --  39  ALT 21 21  --   --   --  20  ALKPHOS  103 110  --   --   --  119  BILITOT 0.7 0.4  --   --   --  0.4   < > = values in this interval not displayed.    ASSESSMENT & PLAN:   Ureteral cancer, right (HCC) # High-grade urothelial cancer/cytology; likely of the right renal pelvis /upper ureter. On keytruda-CT scan: FEB 14th, 2024-  stable disease noted in the proximal right ureter-without any evidence of progression.  See below-regarding sigmoid cancer- Stable; will repeat imaging in the next 1 to 2 months.  # proceed with [place] today- Labs today reviewed; Stable TSH APRIL 2024--WNL.  Stable.   # PVD/ Left LE cellulitis- on antibiotics [would care]-s/p evaluation with wound care.  S/p antibiotics improved.  As per patient awaiting repeat vascular evaluation.  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 - ELEVEN LYMPH NODES, NEGATIVE FOR MALIGNANCY (0/11). - SURGICAL MARGINS FREE OF DYSPLASIA AND MALIGNANCY.  Discussed with Dr. Maia Plan regarding staple removal.  # Iron deficiency anemia-hemoglobin 11-12; FEB - iron studies-10%; ferritin-27 -hemoglobin 13.  HOLD  Venofer.  # Hypocalcemia- Mild- on Ca+vit D; G+FEB 2024- 42. Stable  # Social worker re: insurance/ medicaid   #DISPOSITION: #  Social worker re: insurance/ mediacid #  today Keytruda; HOLD venofer # follow up in 3  weeks MD- labs-cbc/cmp; Keytruda;venofer; -Dr.B.    All questions were answered. The patient knows to call the clinic with any problems, questions or concerns.    Earna Coder, MD 12/08/2022 3:17 PM

## 2022-12-08 NOTE — H&P (View-Only) (Signed)
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  - ELEVEN LYMPH NODES, NEGATIVE FOR MALIGNANCY (0/11).  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  Is alone/in a wheelchair.  Chad [name] 62 y.o.  male above history of stage IV-ureteral cancer/history of stage III colon cancer right side; recurrent sigmoid colon cancer [un-resected/under surveillance] and multiple  other comorbidities currently on Keytruda is here for follow-up.  In the interim patient underwent partial colectomy/surgery for sigmoid cancer.  Patient has recovered fairly well.  Patient currently has staples in place.   Denies any nausea. No diarrhea. Feels some fatigue. Chronic pain in left leg.  Denies any worsening abdominal pain.  No blood in stools or black-colored stools.  No hematuria.  Chronic shortness of breath.  Not any worse.  6  Review of Systems  Constitutional:  Positive for malaise/fatigue. Negative for chills, diaphoresis, fever and weight loss.  HENT:  Negative for nosebleeds and sore throat.   Eyes:  Negative for double vision.  Respiratory:  Positive for cough and shortness of breath. Negative for hemoptysis and wheezing.   Cardiovascular:  Negative for chest pain, palpitations and orthopnea.  Gastrointestinal:  Positive for constipation. Negative for abdominal pain, blood in stool, diarrhea, heartburn, melena, nausea and vomiting.  Genitourinary:  Negative for dysuria, frequency and urgency.  Musculoskeletal:  Positive for back pain and joint pain.  Skin: Negative.  Negative for itching and rash.  Neurological:  Positive for focal weakness. Negative for dizziness, tingling, weakness and headaches.       Chronic left-sided weakness upper than lower extremity.  Endo/Heme/Allergies:  Does not bruise/bleed easily.  Psychiatric/Behavioral:  Negative for depression. The patient is not nervous/anxious and does not have insomnia.      MEDICAL HISTORY:  Past Medical History:  Diagnosis Date   Anemia    Anxiety    ARF (acute respiratory failure) (HCC)    Atherosclerosis of arteries of extremities (HCC)    Bladder cancer (HCC)    BPH with obstruction/lower urinary tract symptoms    Colon   cancer (HCC)    COPD (chronic obstructive pulmonary disease) (HCC)    Depression    Dysphagia    Family history of colon cancer    Family history of kidney cancer    Family  history of leukemia    Family history of prostate cancer    GERD (gastroesophageal reflux disease)    Hepatitis    chronic hep c   Hydronephrosis    Hydronephrosis with ureteral stricture    Hyperlipidemia    Knee pain    Left   Malignant neoplasm of colon (HCC)    Malnutrition (HCC)    Nerve pain    Peripheral vascular disease (HCC)    Pressure ulcer    Prostate cancer (HCC)    Stroke (HCC)    Ureteral cancer, right (HCC)    Urinary frequency    Venous hypertension of both lower extremities     SURGICAL HISTORY: Past Surgical History:  Procedure Laterality Date   COLON SURGERY     En bloc extended right hemicolectomy 07/2017   COLONOSCOPY WITH PROPOFOL N/A 11/06/2020   Procedure: COLONOSCOPY WITH PROPOFOL;  Surgeon: Anna, Kiran, MD;  Location: [facility];  Service: Gastroenterology;  Laterality: N/A;   COLONOSCOPY WITH PROPOFOL N/A 07/31/2021   Procedure: COLONOSCOPY WITH PROPOFOL;  Surgeon: Byrnett, Jeffrey W, MD;  Location: [facility];  Service: Endoscopy;  Laterality: N/A;   CYSTOSCOPY W/ RETROGRADES Right 08/30/2018   Procedure: CYSTOSCOPY WITH RETROGRADE PYELOGRAM;  Surgeon: Brandon, Ashley, MD;  Location: [facility];  Service: Urology;  Laterality: Right;   CYSTOSCOPY WITH STENT PLACEMENT Right 04/25/2018   Procedure: CYSTOSCOPY WITH STENT PLACEMENT;  Surgeon: Brandon, Ashley, MD;  Location: [facility];  Service: Urology;  Laterality: Right;   CYSTOSCOPY WITH STENT PLACEMENT Right 08/30/2018   Procedure: CYSTOSCOPY WITH STENT Exchange;  Surgeon: Brandon, Ashley, MD;  Location: [facility];  Service: Urology;  Laterality: Right;   CYSTOSCOPY WITH STENT PLACEMENT Right 03/07/2019   Procedure: CYSTOSCOPY WITH STENT Exchange;  Surgeon: Brandon, Ashley, MD;  Location: [facility];  Service: Urology;  Laterality: Right;   CYSTOSCOPY WITH STENT PLACEMENT Right 11/21/2019   Procedure: CYSTOSCOPY WITH STENT Exchange;  Surgeon: Brandon, Ashley, MD;  Location: [facility];  Service:  Urology;  Laterality: Right;   LOWER EXTREMITY ANGIOGRAPHY Left 05/23/2019   Procedure: LOWER EXTREMITY ANGIOGRAPHY;  Surgeon: Dew, Jason S, MD;  Location: [facility];  Service: Cardiovascular;  Laterality: Left;   LOWER EXTREMITY ANGIOGRAPHY Right 05/30/2019   Procedure: LOWER EXTREMITY ANGIOGRAPHY;  Surgeon: Dew, Jason S, MD;  Location: [facility];  Service: Cardiovascular;  Laterality: Right;   LOWER EXTREMITY ANGIOGRAPHY Right 02/13/2020   Procedure: LOWER EXTREMITY ANGIOGRAPHY;  Surgeon: Dew, Jason S, MD;  Location: [facility];  Service: Cardiovascular;  Laterality: Right;   LOWER EXTREMITY ANGIOGRAPHY Left 02/20/2020   Procedure: LOWER EXTREMITY ANGIOGRAPHY;  Surgeon: Dew, Jason S, MD;  Location: [facility];  Service: Cardiovascular;  Laterality: Left;   PORTA CATH INSERTION N/A 02/28/2019   Procedure: PORTA CATH INSERTION;  Surgeon: Dew, Jason S, MD;  Location: [facility];  Service: Cardiovascular;  Laterality: N/A;   ROBOT ASSISTED LAPAROSCOPIC PARTIAL COLECTOMY  11/17/2022   tumor removed       SOCIAL HISTORY: Social History   Socioeconomic History   Marital status: Single    Spouse name: Not on file   Number of children: Not on file   Years of education: Not on file   Highest   education level: Not on file  Occupational History   Not on file  Tobacco Use   Smoking status: Every Day    Packs/day: .5    Types: Cigarettes   Smokeless tobacco: Never  Vaping Use   Vaping Use: Never used  Substance and Sexual Activity   Alcohol use: Not Currently   Drug use: Not Currently    Types: Marijuana, Methylphenidate    Comment: quit 1997-1998 ish   Sexual activity: Not Currently  Other Topics Concern   Not on file  Social History Narrative    [place] live [place]; moved  [place]- end of April 2019; in Nursing home; 1pp/day; quit alcohol. 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        Family- dad- prostate ca [at 78y]; brother- 47 died of  prostate cancer; brother- 54- no cancers [[place]]; son- [name] [[place]];[name]-32y prostate ca [[place]]; daughter- 38 [[place]]; another daughter 36 [[place]/addict]. will refer genetics counseling. Given MSI- abnormal; highly suspicious of Lynch syndrome.  Patient's son Christopher aware of high possible lynch syndrome.   Social Determinants of Health   Financial Resource Strain: Not on file  Food Insecurity: No Food Insecurity (11/17/2022)   Hunger Vital Sign    Worried About Running Out of Food in the Last Year: Never true    Ran Out of Food in the Last Year: Never true  Transportation Needs: No Transportation Needs (11/17/2022)   PRAPARE - Transportation    Lack of Transportation (Medical): No    Lack of Transportation (Non-Medical): No  Physical Activity: Not on file  Stress: Not on file  Social Connections: Not on file  Intimate Partner Violence: Not At Risk (11/17/2022)   Humiliation, Afraid, Rape, and Kick questionnaire    Fear of Current or Ex-Partner: No    Emotionally Abused: No    Physically Abused: No    Sexually Abused: No    FAMILY HISTORY: Family History  Problem Relation Age of Onset   Prostate cancer Father 79   Cancer Brother 47       unsure type   Cancer Paternal Uncle        unsure type   Cancer Maternal Grandmother        unsure type   Cancer Paternal Grandmother        unsure type   Kidney cancer Paternal Grandfather    Cancer Other        unsure types   Leukemia Son    Cancer Son        other cancers, possibly colon    ALLERGIES:  is allergic to penicillins.  MEDICATIONS:  Current Outpatient Medications  Medication Sig Dispense Refill   acetaminophen (TYLENOL) 325 MG tablet Take 650 mg by mouth 3 (three) times daily.      aspirin EC 81 MG tablet Take 1 tablet (81 mg total) by mouth daily. 150 tablet 2   Calcium Carb-Cholecalciferol (CALCIUM CARBONATE-VITAMIN D3 PO) Take 2 tablets by mouth daily. 600-400 mg     carboxymethylcellulose 1 % ophthalmic  solution Apply 1 drop to eye 3 (three) times daily. Left eye     clopidogrel (PLAVIX) 75 MG tablet Take 75 mg by mouth daily.     cyclobenzaprine (FEXMID) 7.5 MG tablet Take 15 mg by mouth at bedtime.     escitalopram (LEXAPRO) 5 MG tablet Take 5 mg by mouth daily.     gabapentin (NEURONTIN) 100 MG capsule Take 100 mg by mouth 3 (three) times daily.       mirtazapine (REMERON) 7.5 MG tablet Take 7.5 mg by mouth at bedtime.     oxycodone (OXY-IR) 5 MG capsule Take 10 mg by mouth every 6 (six) hours.     senna (SENOKOT) 8.6 MG TABS tablet Take 1 tablet by mouth at bedtime.     senna-docusate (SENOKOT-S) 8.6-50 MG tablet Take 1 tablet by mouth daily.     tamsulosin (FLOMAX) 0.4 MG CAPS capsule Take 1 capsule (0.4 mg total) by mouth daily after supper. 30 capsule 3   traMADol (ULTRAM) 50 MG tablet Take 1 tablet (50 mg total) by mouth every 6 (six) hours as needed. 20 tablet 0   Amino Acids-Protein Hydrolys (PRO-STAT) LIQD Take 45 mLs by mouth daily.     atorvastatin (LIPITOR) 10 MG tablet Take 1 tablet (10 mg total) by mouth daily. 30 tablet 11   collagenase (SANTYL) 250 UNIT/GM ointment Apply 1 Application topically daily as needed. (Patient not taking: Reported on 12/08/2022)     No current facility-administered medications for this visit.   Facility-Administered Medications Ordered in Other Visits  Medication Dose Route Frequency Provider Last Rate Last Admin   heparin lock flush 100 UNIT/ML injection            heparin lock flush 100 unit/mL  500 Units Intracatheter Once PRN Vinita Prentiss R, MD       pembrolizumab (KEYTRUDA) 200 mg in sodium chloride 0.9 % 50 mL chemo infusion  200 mg Intravenous Once Jaiah Weigel R, MD          .  PHYSICAL EXAMINATION: ECOG PERFORMANCE STATUS: 1 - Symptomatic but completely ambulatory  Vitals:   12/08/22 1423  BP: 108/74  Pulse: 84  Temp: 98 F (36.7 C)  SpO2: 95%      Filed Weights   12/08/22 1423  Weight: 155 lb (70.3 kg)      Left lower extremity erythema/swelling-cellulitis.  Physical Exam HENT:     Head: Normocephalic and atraumatic.     Mouth/Throat:     Pharynx: No oropharyngeal exudate.  Eyes:     Pupils: Pupils are equal, round, and reactive to light.  Cardiovascular:     Rate and Rhythm: Normal rate and regular rhythm.  Pulmonary:     Effort: No respiratory distress.     Breath sounds: No wheezing.  Abdominal:     General: Bowel sounds are normal. There is no distension.     Palpations: Abdomen is soft. There is no mass.     Tenderness: There is no abdominal tenderness. There is no guarding or rebound.  Musculoskeletal:        General: No tenderness. Normal range of motion.     Cervical back: Normal range of motion and neck supple.  Skin:    General: Skin is warm.     Comments: Bilateral lower extremity ulcerations noted.  Pulses intact bilaterally.  Cystlike lesion noted right elbow-nontender.  No erythema noted.  Neurological:     Mental Status: He is oriented to person, place, and time.     Comments: Sleepy but easily arousable.  Chronic weakness left upper and lower extremity.  Psychiatric:        Mood and Affect: Affect normal.      LABORATORY DATA:  I have reviewed the data as listed Lab Results  Component Value Date   WBC 6.8 12/08/2022   HGB 13.0 12/08/2022   HCT 40.1 12/08/2022   MCV 84.6 12/08/2022   PLT 199 12/08/2022   Recent Labs      10/09/22 0837 10/28/22 0754 11/04/22 1221 11/18/22 0420 11/19/22 0603 12/08/22 1413  NA 133* 135   < > 137 135 137  K 4.2 4.0   < > 4.4 4.0 3.8  CL 102 107   < > 106 109 104  CO2 24 24   < > 23 23 23  GLUCOSE 112* 91   < > 127* 85 118*  BUN 21 32*   < > 14 14 21  CREATININE 1.20 1.16   < > 0.99 0.86 1.09  CALCIUM 8.2* 8.5*   < > 8.2* 7.7* 8.6*  GFRNONAA >60 >60   < > >60 >60 >60  PROT 7.6 7.7  --   --   --  7.6  ALBUMIN 3.1* 3.2*  --   --   --  3.4*  AST 36 35  --   --   --  39  ALT 21 21  --   --   --  20  ALKPHOS  103 110  --   --   --  119  BILITOT 0.7 0.4  --   --   --  0.4   < > = values in this interval not displayed.    ASSESSMENT & PLAN:   Ureteral cancer, right (HCC) # High-grade urothelial cancer/cytology; likely of the right renal pelvis /upper ureter. On keytruda-CT scan: FEB 14th, 2024-  stable disease noted in the proximal right ureter-without any evidence of progression.  See below-regarding sigmoid cancer- Stable; will repeat imaging in the next 1 to 2 months.  # proceed with keytruda today- Labs today reviewed; Stable TSH APRIL 2024--WNL.  Stable.   # PVD/ Left LE cellulitis- on antibiotics [would care]-s/p evaluation with wound care.  S/p antibiotics improved.  As per patient awaiting repeat vascular evaluation.  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 - ELEVEN LYMPH NODES, NEGATIVE FOR MALIGNANCY (0/11). - SURGICAL MARGINS FREE OF DYSPLASIA AND MALIGNANCY.  Discussed with Dr. Cintron regarding staple removal.  # Iron deficiency anemia-hemoglobin 11-12; FEB - iron studies-10%; ferritin-27 -hemoglobin 13.  HOLD  Venofer.  # Hypocalcemia- Mild- on Ca+vit D; G+FEB 2024- 42. Stable  # Social worker re: insurance/ medicaid   #DISPOSITION: #  Social worker re: insurance/ mediacid #  today Keytruda; HOLD venofer # follow up in 3  weeks MD- labs-cbc/cmp; Keytruda;venofer; -Dr.B.    All questions were answered. The patient knows to call the clinic with any problems, questions or concerns.    Ceniya Fowers R Jamell Opfer, MD 12/08/2022 3:17 PM    

## 2022-12-08 NOTE — Assessment & Plan Note (Addendum)
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 On keytruda-CT scan: FEB 14th, 2024-  stable disease noted in the proximal right ureter-without any evidence of progression.  See below-regarding sigmoid cancer- Stable; will repeat imaging in the next 1 to 2 months.  # proceed with [place] today- Labs today reviewed; Stable TSH APRIL 2024--WNL.  Stable.   # PVD/ Left LE cellulitis- on antibiotics [would care]-s/p evaluation with wound care.  S/p antibiotics improved.  As per patient awaiting repeat vascular evaluation.  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 - ELEVEN LYMPH NODES, NEGATIVE FOR MALIGNANCY (0/11). - SURGICAL MARGINS FREE OF DYSPLASIA AND MALIGNANCY.  Discussed with Dr. Maia Plan regarding staple removal.  # Iron deficiency anemia-hemoglobin 11-12; FEB - iron studies-10%; ferritin-27 -hemoglobin 13.  HOLD  Venofer.  # Hypocalcemia- Mild- on Ca+vit D; G+FEB 2024- 42. Stable  # Social worker re: insurance/ medicaid   #DISPOSITION: #  Social worker re: insurance/ mediacid #  today Keytruda; HOLD venofer # follow up in 3  weeks MD- labs-cbc/cmp; Keytruda;venofer; -Dr.B.

## 2022-12-08 NOTE — Patient Instructions (Signed)
[facility]  Discharge Instructions: Thank you for choosing [facility] to provide your oncology and hematology care.  If you have a lab appointment with the Cancer Center, please go directly to the Cancer Center and check in at the registration area.  Wear comfortable clothing and clothing appropriate for easy access to any Portacath or PICC line.   We strive to give you quality time with your provider. You may need to reschedule your appointment if you arrive late (15 or more minutes).  Arriving late affects you and other patients whose appointments are after yours.  Also, if you miss three or more appointments without notifying the office, you may be dismissed from the clinic at the provider's discretion.      For prescription refill requests, have your pharmacy contact our office and allow 72 hours for refills to be completed.    Today you received the following chemotherapy and/or immunotherapy agents KEYTRUDA      To help prevent nausea and vomiting after your treatment, we encourage you to take your nausea medication as directed.  BELOW ARE SYMPTOMS THAT SHOULD BE REPORTED IMMEDIATELY: *FEVER GREATER THAN 100.4 F (38 C) OR HIGHER *CHILLS OR SWEATING *NAUSEA AND VOMITING THAT IS NOT CONTROLLED WITH YOUR NAUSEA MEDICATION *UNUSUAL SHORTNESS OF BREATH *UNUSUAL BRUISING OR BLEEDING *URINARY PROBLEMS (pain or burning when urinating, or frequent urination) *BOWEL PROBLEMS (unusual diarrhea, constipation, pain near the anus) TENDERNESS IN MOUTH AND THROAT WITH OR WITHOUT PRESENCE OF ULCERS (sore throat, sores in mouth, or a toothache) UNUSUAL RASH, SWELLING OR PAIN  UNUSUAL VAGINAL DISCHARGE OR ITCHING   Items with * indicate a potential emergency and should be followed up as soon as possible or go to the Emergency Department if any problems should occur.  Please show the CHEMOTHERAPY ALERT CARD or IMMUNOTHERAPY ALERT CARD at check-in to  the Emergency Department and triage nurse.  Should you have questions after your visit or need to cancel or reschedule your appointment, please contact [facility]  [phone] and follow the prompts.  Office hours are 8:00 a.m. to 4:30 p.m. Monday - Friday. Please note that voicemails left after 4:00 p.m. may not be returned until the following business day.  We are closed weekends and major holidays. You have access to a nurse at all times for urgent questions. Please call the main number to the clinic [phone] and follow the prompts.  For any non-urgent questions, you may also contact your provider using MyChart. We now offer e-Visits for anyone 18 and older to request care online for non-urgent symptoms. For details visit [email or web address].   Also download the MyChart app! Go to the app store, search "MyChart", open the app, select [facility], and log in with your MyChart username and password. . Pembrolizumab Injection What is this medication? PEMBROLIZUMAB (PEM broe LIZ ue mab) treats some types of cancer. It works by helping your immune system slow or stop the spread of cancer cells. It is a monoclonal antibody. This medicine may be used for other purposes; ask your health care provider or pharmacist if you have questions. COMMON BRAND NAME(S): Keytruda What should I tell my care team before I take this medication? They need to know if you have any of these conditions: Allogeneic stem cell transplant (uses someone else's stem cells) Autoimmune diseases, such as Crohn disease, ulcerative colitis, lupus History of chest radiation Nervous system problems, such as Guillain-Barre syndrome, myasthenia gravis   Organ transplant An unusual or allergic reaction to pembrolizumab, other medications, foods, dyes, or preservatives Pregnant or trying to get pregnant Breast-feeding How should I use this medication? This medication is injected into a vein.  It is given by your care team in a hospital or clinic setting. A special MedGuide will be given to you before each treatment. Be sure to read this information carefully each time. Talk to your care team about the use of this medication in children. While it may be prescribed for children as young as 6 months for selected conditions, precautions do apply. Overdosage: If you think you have taken too much of this medicine contact a poison control center or emergency room at once. NOTE: This medicine is only for you. Do not share this medicine with others. What if I miss a dose? Keep appointments for follow-up doses. It is important not to miss your dose. Call your care team if you are unable to keep an appointment. What may interact with this medication? Interactions have not been studied. This list may not describe all possible interactions. Give your health care provider a list of all the medicines, herbs, non-prescription drugs, or dietary supplements you use. Also tell them if you smoke, drink alcohol, or use illegal drugs. Some items may interact with your medicine. What should I watch for while using this medication? Your condition will be monitored carefully while you are receiving this medication. You may need blood work while taking this medication. This medication may cause serious skin reactions. They can happen weeks to months after starting the medication. Contact your care team right away if you notice fevers or flu-like symptoms with a rash. The rash may be red or purple and then turn into blisters or peeling of the skin. You may also notice a red rash with swelling of the face, lips, or lymph nodes in your neck or under your arms. Tell your care team right away if you have any change in your eyesight. Talk to your care team if you may be pregnant. Serious birth defects can occur if you take this medication during pregnancy and for 4 months after the last dose. You will need a negative  pregnancy test before starting this medication. Contraception is recommended while taking this medication and for 4 months after the last dose. Your care team can help you find the option that works for you. Do not breastfeed while taking this medication and for 4 months after the last dose. What side effects may I notice from receiving this medication? Side effects that you should report to your care team as soon as possible: Allergic reactions--skin rash, itching, hives, swelling of the face, lips, tongue, or throat Dry cough, shortness of breath or trouble breathing Eye pain, redness, irritation, or discharge with blurry or decreased vision Heart muscle inflammation--unusual weakness or fatigue, shortness of breath, chest pain, fast or irregular heartbeat, dizziness, swelling of the ankles, feet, or hands Hormone gland problems--headache, sensitivity to light, unusual weakness or fatigue, dizziness, fast or irregular heartbeat, increased sensitivity to cold or heat, excessive sweating, constipation, hair loss, increased thirst or amount of urine, tremors or shaking, irritability Infusion reactions--chest pain, shortness of breath or trouble breathing, feeling faint or lightheaded Kidney injury (glomerulonephritis)--decrease in the amount of urine, red or dark brown urine, foamy or bubbly urine, swelling of the ankles, hands, or feet Liver injury--right upper belly pain, loss of appetite, nausea, light-colored stool, dark yellow or brown urine, yellowing skin or eyes, unusual weakness   or fatigue Pain, tingling, or numbness in the hands or feet, muscle weakness, change in vision, confusion or trouble speaking, loss of balance or coordination, trouble walking, seizures Rash, fever, and swollen lymph nodes Redness, blistering, peeling, or loosening of the skin, including inside the mouth Sudden or severe stomach pain, bloody diarrhea, fever, nausea, vomiting Side effects that usually do not require  medical attention (report to your care team if they continue or are bothersome): Bone, joint, or muscle pain Diarrhea Fatigue Loss of appetite Nausea Skin rash This list may not describe all possible side effects. Call your doctor for medical advice about side effects. You may report side effects to FDA at 1-800-FDA-1088. Where should I keep my medication? This medication is given in a hospital or clinic. It will not be stored at home. NOTE: This sheet is a summary. It may not cover all possible information. If you have questions about this medicine, talk to your doctor, pharmacist, or health care provider.  2023 Elsevier/Gold Standard (2021-11-26 00:00:00)    

## 2022-12-09 LAB — T4: T4, Total: 9.5 ug/dL (ref 4.5–12.0)

## 2022-12-11 ENCOUNTER — Telehealth (INDEPENDENT_AMBULATORY_CARE_PROVIDER_SITE_OTHER): Payer: Self-pay

## 2022-12-11 DIAGNOSIS — L97909 Non-pressure chronic ulcer of unspecified part of unspecified lower leg with unspecified severity: Secondary | ICD-10-CM

## 2022-12-11 NOTE — Telephone Encounter (Signed)
Tasha from [employer] called yesterday for the patient to let us know he wanted to reschedule his procedure with Dr. Wyn Quaker for a LLE angio. Patient has been rescheduled to 01/01/23 with a 11:00 am arrival time to the [facility]. Pre-procedure instructions will be faxed.

## 2022-12-12 ENCOUNTER — Encounter: Payer: Self-pay | Admitting: Licensed Clinical Social Worker

## 2022-12-12 ENCOUNTER — Encounter: Payer: Medicaid Other | Admitting: Physician Assistant

## 2022-12-12 DIAGNOSIS — I87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 hypertension (idiopathic) with ulcer and inflammation of left lower extremity: Secondary | ICD-10-CM | POA: Diagnosis not present

## 2022-12-12 NOTE — Progress Notes (Signed)
[facility] Clinical Social Work  Clinical Social Work was referred by medical provider for assessment of psychosocial needs.  Clinical Social Worker attempted to contact patient by phone  to offer support and assess for needs.  CSW left voicemail with contact information and request for return call.    FA   Cavon Nicolls, LCSW  Clinical Social Worker [facility]        Patient is participating in a Managed Medicaid Plan:  Yes 

## 2022-12-12 NOTE — Progress Notes (Signed)
YANZEL, MULLARKEY ([id number]) [id number] Page 1 of 1 Visit Report for 12/12/2022 Chief Complaint Document Details Patient Name: Date of Service: Chad [name], Chad [name] 12/12/2022 11:00 A M Medical Record Number: [id number] Patient Account Number: [id number] Date of Birth/Sex: Treating RN: 1960-08-30 (62 y.o. [name] Primary Care Provider: [name] Other Clinician: Referring Provider: Treating Provider/Extender: [name] Weeks in Treatment: 58 Information Obtained from: Patient Chief Complaint Left ankle ulcer, Left dorsal foot wound, right heel Electronic Signature(s) Signed: 12/12/2022 11:15:00 AM By: [name] PA-C Entered By: [name] on 12/12/2022 11:15:00 -------------------------------------------------------------------------------- Problem List Details Patient Name: Date of Service: Chad [name], Chad [name] 12/12/2022 11:00 A M Medical Record Number: [id number] Patient Account Number: [id number] Date of Birth/Sex: Treating RN: Dec 12, 1960 (62 y.o. 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

## 2022-12-18 ENCOUNTER — Ambulatory Visit
Admission: RE | Admit: 2022-12-18 | Discharge: 2022-12-18 | Disposition: A | Discharge status: Skilled Nursing Facility | Payer: Medicaid Other | Attending: Vascular Surgery | Admitting: Vascular Surgery

## 2022-12-18 ENCOUNTER — Encounter
Admission: RE | Disposition: A | Discharge status: Skilled Nursing Facility | Payer: Self-pay | Source: Home / Self Care | Attending: Vascular Surgery

## 2022-12-18 ENCOUNTER — Encounter: Payer: Self-pay | Admitting: Licensed Clinical Social Worker

## 2022-12-18 DIAGNOSIS — L97919 Non-pressure chronic ulcer of unspecified part of right lower leg with unspecified severity: Secondary | ICD-10-CM | POA: Diagnosis not present

## 2022-12-18 DIAGNOSIS — L97929 Non-pressure chronic ulcer of unspecified part of left lower leg with unspecified severity: Secondary | ICD-10-CM | POA: Insufficient documentation

## 2022-12-18 DIAGNOSIS — I70245 Atherosclerosis of native arteries of left leg with ulceration of other part of foot: Secondary | ICD-10-CM | POA: Insufficient documentation

## 2022-12-18 DIAGNOSIS — I70238 Atherosclerosis of native arteries of right leg with ulceration of other part of lower right leg: Secondary | ICD-10-CM | POA: Diagnosis present

## 2022-12-18 DIAGNOSIS — Z538 Procedure and treatment not carried out for other reasons: Secondary | ICD-10-CM | POA: Diagnosis not present

## 2022-12-18 DIAGNOSIS — L97909 Non-pressure chronic ulcer of unspecified part of unspecified lower leg with unspecified severity: Secondary | ICD-10-CM

## 2022-12-18 LAB — CREATININE, SERUM
Creatinine, Ser: 0.99 mg/dL (ref 0.61–1.24)
GFR, Estimated: >60 mL/min (ref >60)

## 2022-12-18 LAB — BUN: BUN: 18 mg/dL (ref 8–23)

## 2022-12-18 SURGERY — LOWER EXTREMITY ANGIOGRAPHY
Anesthesia: Moderate Sedation | Site: Leg Lower | Laterality: Right

## 2022-12-18 MED ORDER — ONDANSETRON HCL 4 MG/2ML IJ SOLN: 4.0000 mg | Freq: Four times a day (QID) | INTRAMUSCULAR | Status: DC | PRN

## 2022-12-18 MED ORDER — FENTANYL CITRATE (PF) 100 MCG/2ML IJ SOLN
INTRAMUSCULAR | Status: AC
  Filled 2022-12-18: qty 2

## 2022-12-18 MED ORDER — SODIUM CHLORIDE 0.9 % IV SOLN: INTRAVENOUS | Status: DC

## 2022-12-18 MED ORDER — MIDAZOLAM HCL 5 MG/5ML IJ SOLN
INTRAMUSCULAR | Status: AC
  Filled 2022-12-18: qty 5

## 2022-12-18 MED ORDER — MIDAZOLAM HCL 2 MG/ML PO SYRP: 8.0000 mg | ORAL_SOLUTION | Freq: Once | ORAL | Status: DC | PRN

## 2022-12-18 MED ORDER — HEPARIN SODIUM (PORCINE) 1000 UNIT/ML IJ SOLN
INTRAMUSCULAR | Status: AC
  Filled 2022-12-18: qty 10

## 2022-12-18 MED ORDER — FAMOTIDINE 20 MG PO TABS: 40.0000 mg | ORAL_TABLET | Freq: Once | ORAL | Status: DC | PRN

## 2022-12-18 MED ORDER — DIPHENHYDRAMINE HCL 50 MG/ML IJ SOLN: 50.0000 mg | Freq: Once | INTRAMUSCULAR | Status: DC | PRN

## 2022-12-18 MED ORDER — METHYLPREDNISOLONE SODIUM SUCC 125 MG IJ SOLR: 125.0000 mg | Freq: Once | INTRAMUSCULAR | Status: DC | PRN

## 2022-12-18 MED ORDER — VANCOMYCIN HCL IN DEXTROSE 1-5 GM/200ML-% IV SOLN: 1000.0000 mg | INTRAVENOUS | Status: DC

## 2022-12-18 MED ORDER — HYDROMORPHONE HCL 1 MG/ML IJ SOLN: 1.0000 mg | Freq: Once | INTRAMUSCULAR | Status: DC | PRN

## 2022-12-18 MED ORDER — HEPARIN SOD (PORK) LOCK FLUSH 100 UNIT/ML IV SOLN
INTRAVENOUS | Status: AC
  Filled 2022-12-18: qty 5

## 2022-12-18 MED ORDER — HEPARIN SOD (PORK) LOCK FLUSH 100 UNIT/ML IV SOLN
500.0000 [IU] | Freq: Once | INTRAVENOUS | Status: AC
  Administered 2022-12-18: 500 [IU] via INTRAVENOUS

## 2022-12-18 NOTE — Progress Notes (Signed)
[facility] & spoke with Victorino Dike, [place]. Pt. To be rescheduled since MD's unable to accommodate pt. Today. Pt. Made aware & agreeable to reschedule. Pt. Port flushed with NS & Heparin flush per protocol

## 2022-12-18 NOTE — Progress Notes (Signed)
[facility] Clinical Social Work  Clinical Social Work was referred by medical provider for assessment of psychosocial needs.  Clinical Social Worker attempted to contact patient by phone to offer support and assess for needs.  CSW left voicemail with contact information and request for return call.    SA   Joseph Art, LCSW  Clinical Social Worker [facility]        Patient is participating in a Managed Medicaid Plan:  Yes

## 2022-12-18 NOTE — Interval H&P Note (Signed)
History and Physical Interval Note:  12/18/2022 1:13 PM  Chad [name]  has presented today for surgery, with the diagnosis of RLE Angio   BARD   ASO w ulceration [facility]  [phone].  The various methods of treatment have been discussed with the patient and family. After consideration of risks, benefits and other options for treatment, the patient has consented to  Procedure(s): Lower Extremity Angiography (Right) as a surgical intervention.  The patient's history has been reviewed, patient examined, no change in status, stable for surgery.  I have reviewed the patient's chart and labs.  Questions were answered to the patient's satisfaction.     Chad [name]

## 2022-12-18 NOTE — Progress Notes (Signed)
Called 3x to [facility]: Spoke with Victorino Dike, RN to review medications. Pt. Requesting to have left leg "worked on today since I have more wounds on that leg." Note to Dr. Wyn Quaker and Sheppard Plumber, NP to come speak with pt. Re: request. F.Brown, NP to come speak with pt.

## 2022-12-18 NOTE — Progress Notes (Signed)
Await [facility] to pick up pt. Back to [facility]. Pt. Sitting up in his own "high back wheelchair" eating a sandwich and chips.

## 2022-12-19 ENCOUNTER — Encounter: Payer: Medicaid Other | Admitting: Internal Medicine

## 2022-12-19 DIAGNOSIS — I87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 hypertension (idiopathic) with ulcer and inflammation of left lower extremity: Secondary | ICD-10-CM | POA: Diagnosis not present

## 2022-12-26 ENCOUNTER — Other Ambulatory Visit: Payer: Self-pay

## 2022-12-29 ENCOUNTER — Other Ambulatory Visit: Payer: Self-pay | Admitting: Internal Medicine

## 2022-12-29 ENCOUNTER — Inpatient Hospital Stay: Payer: Medicaid Other

## 2022-12-29 ENCOUNTER — Encounter: Payer: Self-pay | Admitting: Internal Medicine

## 2022-12-29 ENCOUNTER — Inpatient Hospital Stay: Payer: Medicaid Other | Attending: Internal Medicine | Admitting: Internal Medicine

## 2022-12-29 VITALS — BP 129/88 | HR 84 | Temp 96.5°F | Resp 17

## 2022-12-29 DIAGNOSIS — C661 Malignant neoplasm of right ureter: Secondary | ICD-10-CM

## 2022-12-29 DIAGNOSIS — Z7189 Other specified counseling: Secondary | ICD-10-CM

## 2022-12-29 DIAGNOSIS — Z79620 Long term (current) use of immunosuppressive biologic: Secondary | ICD-10-CM | POA: Diagnosis not present

## 2022-12-29 DIAGNOSIS — Z5112 Encounter for antineoplastic immunotherapy: Secondary | ICD-10-CM | POA: Insufficient documentation

## 2022-12-29 DIAGNOSIS — Z515 Encounter for palliative care: Secondary | ICD-10-CM | POA: Diagnosis not present

## 2022-12-29 DIAGNOSIS — C187 Malignant neoplasm of sigmoid colon: Secondary | ICD-10-CM | POA: Insufficient documentation

## 2022-12-29 DIAGNOSIS — D509 Iron deficiency anemia, unspecified: Secondary | ICD-10-CM | POA: Diagnosis not present

## 2022-12-29 DIAGNOSIS — F17210 Nicotine dependence, cigarettes, uncomplicated: Secondary | ICD-10-CM | POA: Diagnosis not present

## 2022-12-29 DIAGNOSIS — D500 Iron deficiency anemia secondary to blood loss (chronic): Secondary | ICD-10-CM

## 2022-12-29 LAB — COMPREHENSIVE METABOLIC PANEL
ALT: 38 U/L (ref 0–44)
AST: 49 U/L — ABNORMAL HIGH (ref 15–41)
Albumin: 3.4 g/dL — ABNORMAL LOW (ref 3.5–5.0)
Alkaline Phosphatase: 121 U/L (ref 38–126)
Anion gap: 4 — ABNORMAL LOW (ref 5–15)
BUN: 17 mg/dL (ref 8–23)
CO2: 25 mmol/L (ref 22–32)
Calcium: 8.5 mg/dL — ABNORMAL LOW (ref 8.9–10.3)
Chloride: 107 mmol/L (ref 98–111)
Creatinine, Ser: 0.98 mg/dL (ref 0.61–1.24)
GFR, Estimated: >60 mL/min (ref >60)
Glucose, Bld: 117 mg/dL — ABNORMAL HIGH (ref 70–99)
Potassium: 3.8 mmol/L (ref 3.5–5.1)
Sodium: 136 mmol/L (ref 135–145)
Total Bilirubin: 0.5 mg/dL (ref 0.3–1.2)
Total Protein: 7.5 g/dL (ref 6.5–8.1)

## 2022-12-29 LAB — CBC WITH DIFFERENTIAL/PLATELET
Abs Immature Granulocytes: 0.02 K/uL (ref 0.00–0.07)
Basophils Absolute: 0.1 K/uL (ref 0.0–0.1)
Basophils Relative: 1 %
Eosinophils Absolute: 0.2 K/uL (ref 0.0–0.5)
Eosinophils Relative: 4 %
HCT: 38.9 % — ABNORMAL LOW (ref 39.0–52.0)
Hemoglobin: 12.8 g/dL — ABNORMAL LOW (ref 13.0–17.0)
Immature Granulocytes: 0 %
Lymphocytes Relative: 21 %
Lymphs Abs: 1.1 K/uL (ref 0.7–4.0)
MCH: 27.9 pg (ref 26.0–34.0)
MCHC: 32.9 g/dL (ref 30.0–36.0)
MCV: 84.9 fL (ref 80.0–100.0)
Monocytes Absolute: 0.5 K/uL (ref 0.1–1.0)
Monocytes Relative: 9 %
Neutro Abs: 3.6 K/uL (ref 1.7–7.7)
Neutrophils Relative %: 65 %
Platelets: 141 K/uL — ABNORMAL LOW (ref 150–400)
RBC: 4.58 MIL/uL (ref 4.22–5.81)
RDW: 16.1 % — ABNORMAL HIGH (ref 11.5–15.5)
WBC: 5.5 K/uL (ref 4.0–10.5)
nRBC: 0 % (ref 0.0–0.2)

## 2022-12-29 LAB — TSH: TSH: 1.575 u[IU]/mL (ref 0.350–4.500)

## 2022-12-29 MED ORDER — HEPARIN SOD (PORK) LOCK FLUSH 100 UNIT/ML IV SOLN
500.0000 [IU] | Freq: Once | INTRAVENOUS | Status: AC | PRN
  Administered 2022-12-29: 500 [IU]
  Filled 2022-12-29: qty 5

## 2022-12-29 MED ORDER — SODIUM CHLORIDE 0.9 % IV SOLN
200.0000 mg | Freq: Once | INTRAVENOUS | Status: AC
  Administered 2022-12-29: 200 mg via INTRAVENOUS
  Filled 2022-12-29: qty 200

## 2022-12-29 MED ORDER — SODIUM CHLORIDE 0.9 % IV SOLN
Freq: Once | INTRAVENOUS | Status: AC
  Filled 2022-12-29: qty 250

## 2022-12-29 NOTE — Assessment & Plan Note (Addendum)
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 On keytruda- APRIL 9th, CT CAP [ER]No definite acute abnormality within the abdomen or pelvis.  Right renal/ureteral findings are similar to the recent prior CT. There is moderate right hydronephrosis with infiltrative appearing soft tissue adjacent to the right renal pelvis and proximal ureter. A right ureteral stent is well positioned extending from the right renal collecting system to the posterior bladder. . Posterior bladder wall irregularity. Dependent 1.5 cm bladder stone. These findings are also similar to the prior CT.   # proceed with [place] today- Labs today reviewed; Stable TSH APRIL 2024--WNL.  Stable. And then will give a break from treatements given multiple other issues/see below. Will order CT CAP today  # PVD/ Left LE cellulitis- on antibiotics [would care]-s/p evaluation with wound care.  S/p antibiotics improved.  Awaiting angiogram with Dr.Dew on 6/6- 2024.   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 - ELEVEN LYMPH NODES, NEGATIVE FOR MALIGNANCY (0/11). - SURGICAL MARGINS FREE OF DYSPLASIA AND MALIGNANCY. S/p staple removal.- stable.   # Iron deficiency anemia-hemoglobin 11-12; FEB - iron studies-10%; ferritin-27 -hemoglobin 12.7- proceed Venofer.  # Hypocalcemia- Mild- on Ca+vit D; G+FEB 2024- 42. Stable  # Social worker re: insurance/ medicaid   #DISPOSITION: #  today Keytruda; enofer # follow up in 2 months- MD- labs-cbc/cmp;thyroid profile  Keytruda;venofer; CT CAP prior- -Dr.B.

## 2022-12-29 NOTE — H&P (View-Only) (Signed)
[facility] CONSULT NOTE  Patient Care Team: Lawson, Healy W, MD as PCP - General (Family Medicine) Kipton [name] R, MD as Consulting Physician (Internal Medicine) Byrnett, Jeffrey W, MD as Consulting Physician (General Surgery)  CHIEF COMPLAINTS/PURPOSE OF CONSULTATION: Urothelial cancer  #  Oncology History Overview Note  # SEP-OCT 2019-right renal pelvis/ ureteral [cytology positive HIGH grade urothelial carcinoma [Dr.Brandon]    # NOV 24th 2019-Keytruda [consent]  # April 2022- colonoscopy [Dr.Anna;incidental PET- sigmoid uptake] # Sigmoid colon cancer- [history of Lynch syndrome]- [Dr.Cintron; APRIL 2024]- s/p hemicolectomy- permanent ostomy; MUCINOUS ADENOCARCINOMA, 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  - ELEVEN LYMPH NODES, NEGATIVE FOR MALIGNANCY (0/11).  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  Is alone/in a wheelchair.  Chad [name] 62 y.o.  male with Lynch syndrome and above 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; recurrent sigmoid colon cancer -stage II and multiple other  comorbidities including PVD/active smoker currently on Keytruda is here for follow-up.  In the interim patient underwent partial colectomy/surgery for sigmoid cancer.  Patient has recovered fairly well.  In the interim patient had his staples taken out.  In the interim patient been evaluated by vascular surgery.  Currently awaiting angiogram and potential procedure on his legs for his PVD.   Denies any nausea. No diarrhea. Feels some fatigue. Chronic pain in left leg.   Denies any worsening abdominal pain.  No blood in stools or black-colored stools.  No hematuria.  Chronic shortness of breath.  Not any worse.   Review of Systems  Constitutional:  Positive for malaise/fatigue. Negative for chills, diaphoresis, fever and weight loss.  HENT:  Negative for nosebleeds and sore throat.   Eyes:  Negative for double vision.  Respiratory:  Positive for cough and shortness of breath. Negative for hemoptysis and wheezing.   Cardiovascular:  Negative for chest pain, palpitations and orthopnea.  Gastrointestinal:  Positive for constipation. Negative for abdominal pain, blood in stool, diarrhea, heartburn, melena, nausea and vomiting.  Genitourinary:  Negative for dysuria, frequency and urgency.  Musculoskeletal:  Positive for back pain and joint pain.  Skin: Negative.  Negative for itching and rash.  Neurological:  Positive for focal weakness. Negative for dizziness, tingling, weakness and headaches.       Chronic left-sided weakness upper than lower extremity.  Endo/Heme/Allergies:  Does not bruise/bleed easily.  Psychiatric/Behavioral:  Negative for depression. The patient is not nervous/anxious and does not have insomnia.      MEDICAL HISTORY:  Past Medical History:  Diagnosis Date   Anemia    Anxiety    ARF (acute respiratory   failure) (HCC)    Atherosclerosis of arteries of extremities (HCC)    Bladder cancer (HCC)    BPH with obstruction/lower urinary tract symptoms    Colon cancer (HCC)     COPD (chronic obstructive pulmonary disease) (HCC)    Depression    Dysphagia    Family history of colon cancer    Family history of kidney cancer    Family history of leukemia    Family history of prostate cancer    GERD (gastroesophageal reflux disease)    Hepatitis    chronic hep c   Hydronephrosis    Hydronephrosis with ureteral stricture    Hyperlipidemia    Knee pain    Left   Malignant neoplasm of colon (HCC)    Malnutrition (HCC)    Nerve pain    Peripheral vascular disease (HCC)    Pressure ulcer    Prostate cancer (HCC)    Stroke (HCC)    Ureteral cancer, right (HCC)    Urinary frequency    Venous hypertension of both lower extremities     SURGICAL HISTORY: Past Surgical History:  Procedure Laterality Date   COLON SURGERY     En bloc extended right hemicolectomy 07/2017   COLONOSCOPY WITH PROPOFOL N/A 11/06/2020   Procedure: COLONOSCOPY WITH PROPOFOL;  Surgeon: Anna, Kiran, MD;  Location: [facility];  Service: Gastroenterology;  Laterality: N/A;   COLONOSCOPY WITH PROPOFOL N/A 07/31/2021   Procedure: COLONOSCOPY WITH PROPOFOL;  Surgeon: Byrnett, Jeffrey W, MD;  Location: [facility];  Service: Endoscopy;  Laterality: N/A;   CYSTOSCOPY W/ RETROGRADES Right 08/30/2018   Procedure: CYSTOSCOPY WITH RETROGRADE PYELOGRAM;  Surgeon: Brandon, Ashley, MD;  Location: [facility];  Service: Urology;  Laterality: Right;   CYSTOSCOPY WITH STENT PLACEMENT Right 04/25/2018   Procedure: CYSTOSCOPY WITH STENT PLACEMENT;  Surgeon: Brandon, Ashley, MD;  Location: [facility];  Service: Urology;  Laterality: Right;   CYSTOSCOPY WITH STENT PLACEMENT Right 08/30/2018   Procedure: CYSTOSCOPY WITH STENT Exchange;  Surgeon: Brandon, Ashley, MD;  Location: [facility];  Service: Urology;  Laterality: Right;   CYSTOSCOPY WITH STENT PLACEMENT Right 03/07/2019   Procedure: CYSTOSCOPY WITH STENT Exchange;  Surgeon: Brandon, Ashley, MD;  Location: [facility];  Service: Urology;  Laterality:  Right;   CYSTOSCOPY WITH STENT PLACEMENT Right 11/21/2019   Procedure: CYSTOSCOPY WITH STENT Exchange;  Surgeon: Brandon, Ashley, MD;  Location: [facility];  Service: Urology;  Laterality: Right;   LOWER EXTREMITY ANGIOGRAPHY Left 05/23/2019   Procedure: LOWER EXTREMITY ANGIOGRAPHY;  Surgeon: Dew, Jason S, MD;  Location: [facility];  Service: Cardiovascular;  Laterality: Left;   LOWER EXTREMITY ANGIOGRAPHY Right 05/30/2019   Procedure: LOWER EXTREMITY ANGIOGRAPHY;  Surgeon: Dew, Jason S, MD;  Location: [facility];  Service: Cardiovascular;  Laterality: Right;   LOWER EXTREMITY ANGIOGRAPHY Right 02/13/2020   Procedure: LOWER EXTREMITY ANGIOGRAPHY;  Surgeon: Dew, Jason S, MD;  Location: [facility];  Service: Cardiovascular;  Laterality: Right;   LOWER EXTREMITY ANGIOGRAPHY Left 02/20/2020   Procedure: LOWER EXTREMITY ANGIOGRAPHY;  Surgeon: Dew, Jason S, MD;  Location: [facility];  Service: Cardiovascular;  Laterality: Left;   PORTA CATH INSERTION N/A 02/28/2019   Procedure: PORTA CATH INSERTION;  Surgeon: Dew, Jason S, MD;  Location: [facility];  Service: Cardiovascular;  Laterality: N/A;   ROBOT ASSISTED LAPAROSCOPIC PARTIAL COLECTOMY  11/17/2022   tumor removed       SOCIAL HISTORY: Social History   Socioeconomic History     Marital status: Single    Spouse name: Not on file   Number of children: Not on file   Years of education: Not on file   Highest education level: Not on file  Occupational History   Not on file  Tobacco Use   Smoking status: Every Day    Packs/day: .5    Types: Cigarettes   Smokeless tobacco: Never  Vaping Use   Vaping Use: Never used  Substance and Sexual Activity   Alcohol use: Not Currently   Drug use: Not Currently    Types: Marijuana, Methylphenidate    Comment: quit 1997-1998 ish   Sexual activity: Not Currently  Other Topics Concern   Not on file  Social History Narrative    used to live [place]; moved   To [place]- end of April 2019; in Nursing home; 1pp/day; quit alcohol. 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        Family- dad- prostate ca [at 78y]; brother- 47 died of prostate cancer; brother- 54- no cancers [[place]]; son- [name] [[place]];[name]-32y prostate ca [[place]]; daughter- 38 [[place]]; another daughter 36 [[place]/addict]. will refer genetics counseling. Given MSI- abnormal; highly suspicious of Lynch syndrome.  Patient's son Christopher aware of high possible lynch syndrome.   Social Determinants of Health   Financial Resource Strain: Not on file  Food Insecurity: No Food Insecurity (11/17/2022)   Hunger Vital Sign    Worried About Running Out of Food in the Last Year: Never true    Ran Out of Food in the Last Year: Never true  Transportation Needs: No Transportation Needs (11/17/2022)   PRAPARE - Transportation    Lack of Transportation (Medical): No    Lack of Transportation (Non-Medical): No  Physical Activity: Not on file  Stress: Not on file  Social Connections: Not on file  Intimate Partner Violence: Not At Risk (11/17/2022)   Humiliation, Afraid, Rape, and Kick questionnaire    Fear of Current or Ex-Partner: No    Emotionally Abused: No    Physically Abused: No    Sexually Abused: No    FAMILY HISTORY: Family History  Problem Relation Age of Onset   Prostate cancer Father 79   Cancer Brother 47       unsure type   Cancer Paternal Uncle        unsure type   Cancer Maternal Grandmother        unsure type   Cancer Paternal Grandmother        unsure type   Kidney cancer Paternal Grandfather    Cancer Other        unsure types   Leukemia Son    Cancer Son        other cancers, possibly colon    ALLERGIES:  is allergic to penicillins.  MEDICATIONS:  Current Outpatient Medications  Medication Sig Dispense Refill   acetaminophen (TYLENOL) 325 MG tablet Take 650 mg by mouth 3 (three) times daily.      Amino Acids-Protein Hydrolys (PRO-STAT) LIQD Take 45 mLs by mouth daily.      aspirin EC 81 MG tablet Take 1 tablet (81 mg total) by mouth daily. 150 tablet 2   Calcium Carb-Cholecalciferol (CALCIUM CARBONATE-VITAMIN D3 PO) Take 2 tablets by mouth daily. 600-400 mg     carboxymethylcellulose 1 % ophthalmic solution Apply 1 drop to eye 3 (three) times daily. Left eye     clopidogrel (PLAVIX) 75 MG tablet Take 75 mg by mouth daily.       cyclobenzaprine (FEXMID) 7.5 MG tablet Take 15 mg by mouth at bedtime.     escitalopram (LEXAPRO) 5 MG tablet Take 5 mg by mouth daily.     gabapentin (NEURONTIN) 100 MG capsule Take 100 mg by mouth 3 (three) times daily.     mirtazapine (REMERON) 7.5 MG tablet Take 7.5 mg by mouth at bedtime.     oxycodone (OXY-IR) 5 MG capsule Take 10 mg by mouth every 6 (six) hours.     senna (SENOKOT) 8.6 MG TABS tablet Take 1 tablet by mouth at bedtime.     senna-docusate (SENOKOT-S) 8.6-50 MG tablet Take 1 tablet by mouth daily.     tamsulosin (FLOMAX) 0.4 MG CAPS capsule Take 1 capsule (0.4 mg total) by mouth daily after supper. 30 capsule 3   traMADol (ULTRAM) 50 MG tablet Take 1 tablet (50 mg total) by mouth every 6 (six) hours as needed. 20 tablet 0   atorvastatin (LIPITOR) 10 MG tablet Take 1 tablet (10 mg total) by mouth daily. 30 tablet 11   collagenase (SANTYL) 250 UNIT/GM ointment Apply 1 Application topically daily as needed. (Patient not taking: Reported on 12/08/2022)     No current facility-administered medications for this visit.   Facility-Administered Medications Ordered in Other Visits  Medication Dose Route Frequency Provider Last Rate Last Admin   heparin lock flush 100 UNIT/ML injection            heparin lock flush 100 unit/mL  500 Units Intracatheter Once PRN Doll Frazee R, MD       iron sucrose (VENOFER) 200 mg in sodium chloride 0.9 % 100 mL IVPB  200 mg Intravenous Once Gracyn Santillanes R, MD 440 mL/hr at 12/29/22 1123 200 mg at 12/29/22 1123   pembrolizumab (KEYTRUDA) 200 mg in sodium chloride 0.9 % 50 mL chemo  infusion  200 mg Intravenous Once Charde Macfarlane R, MD          .  PHYSICAL EXAMINATION: ECOG PERFORMANCE STATUS: 1 - Symptomatic but completely ambulatory  Vitals:   12/29/22 1011  BP: 126/83  Pulse: 85  Temp: (!) 96.8 F (36 C)  SpO2: 97%      Filed Weights   12/29/22 1011  Weight: 153 lb (69.4 kg)     Left lower extremity erythema/swelling-cellulitis.  Physical Exam HENT:     Head: Normocephalic and atraumatic.     Mouth/Throat:     Pharynx: No oropharyngeal exudate.  Eyes:     Pupils: Pupils are equal, round, and reactive to light.  Cardiovascular:     Rate and Rhythm: Normal rate and regular rhythm.  Pulmonary:     Effort: No respiratory distress.     Breath sounds: No wheezing.  Abdominal:     General: Bowel sounds are normal. There is no distension.     Palpations: Abdomen is soft. There is no mass.     Tenderness: There is no abdominal tenderness. There is no guarding or rebound.  Musculoskeletal:        General: No tenderness. Normal range of motion.     Cervical back: Normal range of motion and neck supple.  Skin:    General: Skin is warm.     Comments: Bilateral lower extremity ulcerations noted.  Pulses intact bilaterally.  Cystlike lesion noted right elbow-nontender.  No erythema noted.  Neurological:     Mental Status: He is oriented to person, place, and time.     Comments: Sleepy but easily arousable.  Chronic weakness left upper   and lower extremity.  Psychiatric:        Mood and Affect: Affect normal.      LABORATORY DATA:  I have reviewed the data as listed Lab Results  Component Value Date   WBC 5.5 12/29/2022   HGB 12.8 (L) 12/29/2022   HCT 38.9 (L) 12/29/2022   MCV 84.9 12/29/2022   PLT 141 (L) 12/29/2022   Recent Labs    10/28/22 0754 11/04/22 1221 11/19/22 0603 12/08/22 1413 12/18/22 1158 12/29/22 1012  NA 135   < > 135 137  --  136  K 4.0   < > 4.0 3.8  --  3.8  CL 107   < > 109 104  --  107  CO2 24   < >  23 23  --  25  GLUCOSE 91   < > 85 118*  --  117*  BUN 32*   < > 14 21 18 17  CREATININE 1.16   < > 0.86 1.09 0.99 0.98  CALCIUM 8.5*   < > 7.7* 8.6*  --  8.5*  GFRNONAA >60   < > >60 >60 >60 >60  PROT 7.7  --   --  7.6  --  7.5  ALBUMIN 3.2*  --   --  3.4*  --  3.4*  AST 35  --   --  39  --  49*  ALT 21  --   --  20  --  38  ALKPHOS 110  --   --  119  --  121  BILITOT 0.4  --   --  0.4  --  0.5   < > = values in this interval not displayed.    ASSESSMENT & PLAN:   Ureteral cancer, right (HCC) # High-grade urothelial cancer/cytology; likely of the right renal pelvis /upper ureter. On keytruda- APRIL 9th, CT CAP [ER]No definite acute abnormality within the abdomen or pelvis.  Right renal/ureteral findings are similar to the recent prior CT. There is moderate right hydronephrosis with infiltrative appearing soft tissue adjacent to the right renal pelvis and proximal ureter. A right ureteral stent is well positioned extending from the right renal collecting system to the posterior bladder. . Posterior bladder wall irregularity. Dependent 1.5 cm bladder stone. These findings are also similar to the prior CT.   # proceed with keytruda today- Labs today reviewed; Stable TSH APRIL 2024--WNL.  Stable. And then will give a break from treatements given multiple other issues/see below. Will order CT CAP today  # PVD/ Left LE cellulitis- on antibiotics [would care]-s/p evaluation with wound care.  S/p antibiotics improved.  Awaiting angiogram with Dr.Dew on 6/6- 2024.   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 - ELEVEN LYMPH NODES, NEGATIVE FOR MALIGNANCY (0/11). - SURGICAL MARGINS FREE OF DYSPLASIA AND MALIGNANCY. S/p staple removal.- stable.   # Iron deficiency anemia-hemoglobin 11-12; FEB - iron  studies-10%; ferritin-27 -hemoglobin 12.7- proceed Venofer.  # Hypocalcemia- Mild- on Ca+vit D; G+FEB 2024- 42. Stable  # Social worker re: insurance/ medicaid   #DISPOSITION: #  today Keytruda; enofer # follow up in 2 months- MD- labs-cbc/cmp;thyroid profile  Keytruda;venofer; CT CAP prior- -Dr.B.    All questions were answered. The patient knows to call the clinic with any problems, questions or concerns.    Angas Isabell R Chellsea Beckers, MD 12/29/2022 11:38 AM    

## 2022-12-29 NOTE — Patient Instructions (Signed)
[place] [facility]  Discharge Instructions: Thank you for choosing [facility] to provide your oncology and hematology care.  If you have a lab appointment with the Cancer Center, please go directly to the Cancer Center and check in at the registration area.  Wear comfortable clothing and clothing appropriate for easy access to any Portacath or PICC line.   We strive to give you quality time with your provider. You may need to reschedule your appointment if you arrive late (15 or more minutes).  Arriving late affects you and other patients whose appointments are after yours.  Also, if you miss three or more appointments without notifying the office, you may be dismissed from the clinic at the provider's discretion.      For prescription refill requests, have your pharmacy contact our office and allow 72 hours for refills to be completed.    Today you received the following chemotherapy and/or immunotherapy agents Keytruda       To help prevent nausea and vomiting after your treatment, we encourage you to take your nausea medication as directed.  BELOW ARE SYMPTOMS THAT SHOULD BE REPORTED IMMEDIATELY: *FEVER GREATER THAN 100.4 F (38 C) OR HIGHER *CHILLS OR SWEATING *NAUSEA AND VOMITING THAT IS NOT CONTROLLED WITH YOUR NAUSEA MEDICATION *UNUSUAL SHORTNESS OF BREATH *UNUSUAL BRUISING OR BLEEDING *URINARY PROBLEMS (pain or burning when urinating, or frequent urination) *BOWEL PROBLEMS (unusual diarrhea, constipation, pain near the anus) TENDERNESS IN MOUTH AND THROAT WITH OR WITHOUT PRESENCE OF ULCERS (sore throat, sores in mouth, or a toothache) UNUSUAL RASH, SWELLING OR PAIN  UNUSUAL VAGINAL DISCHARGE OR ITCHING   Items with * indicate a potential emergency and should be followed up as soon as possible or go to the Emergency Department if any problems should occur.  Please show the CHEMOTHERAPY ALERT CARD or IMMUNOTHERAPY ALERT CARD at check-in to  the Emergency Department and triage nurse.  Should you have questions after your visit or need to cancel or reschedule your appointment, please contact [place] [facility]  [phone] and follow the prompts.  Office hours are 8:00 a.m. to 4:30 p.m. Monday - Friday. Please note that voicemails left after 4:00 p.m. may not be returned until the following business day.  We are closed weekends and major holidays. You have access to a nurse at all times for urgent questions. Please call the main number to the clinic [phone] and follow the prompts.  For any non-urgent questions, you may also contact your provider using MyChart. We now offer e-Visits for anyone 70 and older to request care online for non-urgent symptoms. For details visit [email or web address].   Also download the MyChart app! Go to the app store, search "MyChart", open the app, select , and log in with your MyChart username and password.   Iron Sucrose Injection What is this medication? IRON SUCROSE (EYE ern SOO krose) treats low levels of iron (iron deficiency anemia) in people with kidney disease. Iron is a mineral that plays an important role in making red blood cells, which carry oxygen from your lungs to the rest of your body. This medicine may be used for other purposes; ask your health care provider or pharmacist if you have questions. COMMON BRAND NAME(S): Venofer What should I tell my care team before I take this medication? They need to know if you have any of these conditions: Anemia not caused by low iron levels Heart disease High levels of iron in  the blood Kidney disease Liver disease An unusual or allergic reaction to iron, other medications, foods, dyes, or preservatives Pregnant or trying to get pregnant Breastfeeding How should I use this medication? This medication is for infusion into a vein. It is given in a hospital or clinic setting. Talk to your care team about  the use of this medication in children. While this medication may be prescribed for children as young as 2 years for selected conditions, precautions do apply. Overdosage: If you think you have taken too much of this medicine contact a poison control center or emergency room at once. NOTE: This medicine is only for you. Do not share this medicine with others. What if I miss a dose? Keep appointments for follow-up doses. It is important not to miss your dose. Call your care team if you are unable to keep an appointment. What may interact with this medication? Do not take this medication with any of the following: Deferoxamine Dimercaprol Other iron products This medication may also interact with the following: Chloramphenicol Deferasirox This list may not describe all possible interactions. Give your health care provider a list of all the medicines, herbs, non-prescription drugs, or dietary supplements you use. Also tell them if you smoke, drink alcohol, or use illegal drugs. Some items may interact with your medicine. What should I watch for while using this medication? Visit your care team regularly. Tell your care team if your symptoms do not start to get better or if they get worse. You may need blood work done while you are taking this medication. You may need to follow a special diet. Talk to your care team. Foods that contain iron include: whole grains/cereals, dried fruits, beans, or peas, leafy green vegetables, and organ meats (liver, kidney). What side effects may I notice from receiving this medication? Side effects that you should report to your care team as soon as possible: Allergic reactions--skin rash, itching, hives, swelling of the face, lips, tongue, or throat Low blood pressure--dizziness, feeling faint or lightheaded, blurry vision Shortness of breath Side effects that usually do not require medical attention (report to your care team if they continue or are  bothersome): Flushing Headache Joint pain Muscle pain Nausea Pain, redness, or irritation at injection site This list may not describe all possible side effects. Call your doctor for medical advice about side effects. You may report side effects to FDA at 1-800-FDA-1088. Where should I keep my medication? This medication is given in a hospital or clinic and will not be stored at home. NOTE: This sheet is a summary. It may not cover all possible information. If you have questions about this medicine, talk to your doctor, pharmacist, or health care provider.  2024 Elsevier/Gold Standard (2022-01-22 00:00:00)

## 2022-12-29 NOTE — Progress Notes (Signed)
No concerns today 

## 2022-12-29 NOTE — Progress Notes (Signed)
[facility] CONSULT NOTE  Patient Care Team: Everardo Beals, MD as PCP - General (Family Medicine) Earna Coder, MD as Consulting Physician (Internal Medicine) Lemar Livings Merrily Pew, MD as Consulting Physician (General Surgery)  CHIEF COMPLAINTS/PURPOSE OF CONSULTATION: Urothelial cancer  #  Oncology History Overview Note  # SEP-OCT 2019-right renal pelvis/ ureteral [cytology positive HIGH grade urothelial carcinoma [Dr.Brandon]    # NOV 24th 2019-Keytruda [consent]  # April 2022- colonoscopy [Dr.Anna;incidental PET- sigmoid uptake] # Sigmoid colon cancer- [history of Lynch syndrome]- [Dr.Cintron; APRIL 2024]- s/p hemicolectomy- permanent ostomy; MUCINOUS ADENOCARCINOMA, 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  - ELEVEN LYMPH NODES, NEGATIVE FOR MALIGNANCY (0/11).  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  Is alone/in a wheelchair.  [name] 62 y.o.  male with Lynch syndrome and above 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; recurrent sigmoid colon cancer -stage II and multiple other  comorbidities including PVD/active smoker currently on Keytruda is here for follow-up.  In the interim patient underwent partial colectomy/surgery for sigmoid cancer.  Patient has recovered fairly well.  In the interim patient had his staples taken out.  In the interim patient been evaluated by vascular surgery.  Currently awaiting angiogram and potential procedure on his legs for his PVD.   Denies any nausea. No diarrhea. Feels some fatigue. Chronic pain in left leg.   Denies any worsening abdominal pain.  No blood in stools or black-colored stools.  No hematuria.  Chronic shortness of breath.  Not any worse.   Review of Systems  Constitutional:  Positive for malaise/fatigue. Negative for chills, diaphoresis, fever and weight loss.  HENT:  Negative for nosebleeds and sore throat.   Eyes:  Negative for double vision.  Respiratory:  Positive for cough and shortness of breath. Negative for hemoptysis and wheezing.   Cardiovascular:  Negative for chest pain, palpitations and orthopnea.  Gastrointestinal:  Positive for constipation. Negative for abdominal pain, blood in stool, diarrhea, heartburn, melena, nausea and vomiting.  Genitourinary:  Negative for dysuria, frequency and urgency.  Musculoskeletal:  Positive for back pain and joint pain.  Skin: Negative.  Negative for itching and rash.  Neurological:  Positive for focal weakness. Negative for dizziness, tingling, weakness and headaches.       Chronic left-sided weakness upper than lower extremity.  Endo/Heme/Allergies:  Does not bruise/bleed easily.  Psychiatric/Behavioral:  Negative for depression. The patient is not nervous/anxious and does not have insomnia.      MEDICAL HISTORY:  Past Medical History:  Diagnosis Date   Anemia    Anxiety    ARF (acute respiratory  failure) (HCC)    Atherosclerosis of arteries of extremities (HCC)    Bladder cancer (HCC)    BPH with obstruction/lower urinary tract symptoms    Colon cancer (HCC)     COPD (chronic obstructive pulmonary disease) (HCC)    Depression    Dysphagia    Family history of colon cancer    Family history of kidney cancer    Family history of leukemia    Family history of prostate cancer    GERD (gastroesophageal reflux disease)    Hepatitis    chronic hep c   Hydronephrosis    Hydronephrosis with ureteral stricture    Hyperlipidemia    Knee pain    Left   Malignant neoplasm of colon (HCC)    Malnutrition (HCC)    Nerve pain    Peripheral vascular disease (HCC)    Pressure ulcer    Prostate cancer (HCC)    Stroke (HCC)    Ureteral cancer, right (HCC)    Urinary frequency    Venous hypertension of both lower extremities     SURGICAL HISTORY: Past Surgical History:  Procedure Laterality Date   COLON SURGERY     En bloc extended right hemicolectomy 07/2017   COLONOSCOPY WITH PROPOFOL N/A 11/06/2020   Procedure: COLONOSCOPY WITH PROPOFOL;  Surgeon: Wyline Mood, MD;  Location: [facility] ENDOSCOPY;  Service: Gastroenterology;  Laterality: N/A;   COLONOSCOPY WITH PROPOFOL N/A 07/31/2021   Procedure: COLONOSCOPY WITH PROPOFOL;  Surgeon: Earline Mayotte, MD;  Location: [facility] ENDOSCOPY;  Service: Endoscopy;  Laterality: N/A;   CYSTOSCOPY W/ RETROGRADES Right 08/30/2018   Procedure: CYSTOSCOPY WITH RETROGRADE PYELOGRAM;  Surgeon: Vanna Scotland, MD;  Location: [facility] ORS;  Service: Urology;  Laterality: Right;   CYSTOSCOPY WITH STENT PLACEMENT Right 04/25/2018   Procedure: CYSTOSCOPY WITH STENT PLACEMENT;  Surgeon: Vanna Scotland, MD;  Location: [facility] ORS;  Service: Urology;  Laterality: Right;   CYSTOSCOPY WITH STENT PLACEMENT Right 08/30/2018   Procedure: CYSTOSCOPY WITH STENT Exchange;  Surgeon: Vanna Scotland, MD;  Location: [facility] ORS;  Service: Urology;  Laterality: Right;   CYSTOSCOPY WITH STENT PLACEMENT Right 03/07/2019   Procedure: CYSTOSCOPY WITH STENT Exchange;  Surgeon: Vanna Scotland, MD;  Location: [facility] ORS;  Service: Urology;  Laterality:  Right;   CYSTOSCOPY WITH STENT PLACEMENT Right 11/21/2019   Procedure: CYSTOSCOPY WITH STENT Exchange;  Surgeon: Vanna Scotland, MD;  Location: [facility] ORS;  Service: Urology;  Laterality: Right;   LOWER EXTREMITY ANGIOGRAPHY Left 05/23/2019   Procedure: LOWER EXTREMITY ANGIOGRAPHY;  Surgeon: Annice Needy, MD;  Location: [facility] INVASIVE CV LAB;  Service: Cardiovascular;  Laterality: Left;   LOWER EXTREMITY ANGIOGRAPHY Right 05/30/2019   Procedure: LOWER EXTREMITY ANGIOGRAPHY;  Surgeon: Annice Needy, MD;  Location: [facility] INVASIVE CV LAB;  Service: Cardiovascular;  Laterality: Right;   LOWER EXTREMITY ANGIOGRAPHY Right 02/13/2020   Procedure: LOWER EXTREMITY ANGIOGRAPHY;  Surgeon: Annice Needy, MD;  Location: [facility] INVASIVE CV LAB;  Service: Cardiovascular;  Laterality: Right;   LOWER EXTREMITY ANGIOGRAPHY Left 02/20/2020   Procedure: LOWER EXTREMITY ANGIOGRAPHY;  Surgeon: Annice Needy, MD;  Location: [facility] INVASIVE CV LAB;  Service: Cardiovascular;  Laterality: Left;   PORTA CATH INSERTION N/A 02/28/2019   Procedure: PORTA CATH INSERTION;  Surgeon: Annice Needy, MD;  Location: [facility] INVASIVE CV LAB;  Service: Cardiovascular;  Laterality: N/A;   ROBOT ASSISTED LAPAROSCOPIC PARTIAL COLECTOMY  11/17/2022   tumor removed       SOCIAL HISTORY: Social History   Socioeconomic History  Marital status: Single    Spouse name: Not on file   Number of children: Not on file   Years of education: Not on file   Highest education level: Not on file  Occupational History   Not on file  Tobacco Use   Smoking status: Every Day    Packs/day: .5    Types: Cigarettes   Smokeless tobacco: Never  Vaping Use   Vaping Use: Never used  Substance and Sexual Activity   Alcohol use: Not Currently   Drug use: Not Currently    Types: Marijuana, Methylphenidate    Comment: quit 1997-1998 ish   Sexual activity: Not Currently  Other Topics Concern   Not on file  Social History Narrative    used to live [place]; moved   To [place]- end of April 2019; in Nursing home; 1pp/day; quit alcohol. 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        Family- dad- prostate ca [at 78y]; brother- 40 died of prostate cancer; brother- 45- no cancers [[place]]; sonThayer Ohm [[name]];[name]-32y prostate ca [facility] [place]]; daughter- 94 [[place]]; another daughter 67 [[place]/addict]. will refer genetics counseling. Given MSI- abnormal; highly suspicious of Lynch syndrome.  Patient's son Cristal Deer aware of high possible lynch syndrome.   Social Determinants of Health   Financial Resource Strain: Not on file  Food Insecurity: No Food Insecurity (11/17/2022)   Hunger Vital Sign    Worried About Running Out of Food in the Last Year: Never true    Ran Out of Food in the Last Year: Never true  Transportation Needs: No Transportation Needs (11/17/2022)   PRAPARE - Administrator, Civil Service (Medical): No    Lack of Transportation (Non-Medical): No  Physical Activity: Not on file  Stress: Not on file  Social Connections: Not on file  Intimate Partner Violence: Not At Risk (11/17/2022)   Humiliation, Afraid, Rape, and Kick questionnaire    Fear of Current or Ex-Partner: No    Emotionally Abused: No    Physically Abused: No    Sexually Abused: No    FAMILY HISTORY: Family History  Problem Relation Age of Onset   Prostate cancer Father 66   Cancer Brother 17       unsure type   Cancer Paternal Uncle        unsure type   Cancer Maternal Grandmother        unsure type   Cancer Paternal Grandmother        unsure type   Kidney cancer Paternal Grandfather    Cancer Other        unsure types   Leukemia Son    Cancer Son        other cancers, possibly colon    ALLERGIES:  is allergic to penicillins.  MEDICATIONS:  Current Outpatient Medications  Medication Sig Dispense Refill   acetaminophen (TYLENOL) 325 MG tablet Take 650 mg by mouth 3 (three) times daily.      Amino Acids-Protein Hydrolys (PRO-STAT) LIQD Take 45 mLs by mouth daily.      aspirin EC 81 MG tablet Take 1 tablet (81 mg total) by mouth daily. 150 tablet 2   Calcium Carb-Cholecalciferol (CALCIUM CARBONATE-VITAMIN D3 PO) Take 2 tablets by mouth daily. 600-400 mg     carboxymethylcellulose 1 % ophthalmic solution Apply 1 drop to eye 3 (three) times daily. Left eye     clopidogrel (PLAVIX) 75 MG tablet Take 75 mg by mouth daily.  cyclobenzaprine (FEXMID) 7.5 MG tablet Take 15 mg by mouth at bedtime.     escitalopram (LEXAPRO) 5 MG tablet Take 5 mg by mouth daily.     gabapentin (NEURONTIN) 100 MG capsule Take 100 mg by mouth 3 (three) times daily.     mirtazapine (REMERON) 7.5 MG tablet Take 7.5 mg by mouth at bedtime.     oxycodone (OXY-IR) 5 MG capsule Take 10 mg by mouth every 6 (six) hours.     senna (SENOKOT) 8.6 MG TABS tablet Take 1 tablet by mouth at bedtime.     senna-docusate (SENOKOT-S) 8.6-50 MG tablet Take 1 tablet by mouth daily.     tamsulosin (FLOMAX) 0.4 MG CAPS capsule Take 1 capsule (0.4 mg total) by mouth daily after supper. 30 capsule 3   traMADol (ULTRAM) 50 MG tablet Take 1 tablet (50 mg total) by mouth every 6 (six) hours as needed. 20 tablet 0   atorvastatin (LIPITOR) 10 MG tablet Take 1 tablet (10 mg total) by mouth daily. 30 tablet 11   collagenase (SANTYL) 250 UNIT/GM ointment Apply 1 Application topically daily as needed. (Patient not taking: Reported on 12/08/2022)     No current facility-administered medications for this visit.   Facility-Administered Medications Ordered in Other Visits  Medication Dose Route Frequency Provider Last Rate Last Admin   heparin lock flush 100 UNIT/ML injection            heparin lock flush 100 unit/mL  500 Units Intracatheter Once PRN Earna Coder, MD       iron sucrose (VENOFER) 200 mg in sodium chloride 0.9 % 100 mL IVPB  200 mg Intravenous Once Louretta Shorten R, MD 440 mL/hr at 12/29/22 1123 200 mg at 12/29/22 1123   pembrolizumab (KEYTRUDA) 200 mg in sodium chloride 0.9 % 50 mL chemo  infusion  200 mg Intravenous Once Earna Coder, MD          .  PHYSICAL EXAMINATION: ECOG PERFORMANCE STATUS: 1 - Symptomatic but completely ambulatory  Vitals:   12/29/22 1011  BP: 126/83  Pulse: 85  Temp: (!) 96.8 F (36 C)  SpO2: 97%      Filed Weights   12/29/22 1011  Weight: 153 lb (69.4 kg)     Left lower extremity erythema/swelling-cellulitis.  Physical Exam HENT:     Head: Normocephalic and atraumatic.     Mouth/Throat:     Pharynx: No oropharyngeal exudate.  Eyes:     Pupils: Pupils are equal, round, and reactive to light.  Cardiovascular:     Rate and Rhythm: Normal rate and regular rhythm.  Pulmonary:     Effort: No respiratory distress.     Breath sounds: No wheezing.  Abdominal:     General: Bowel sounds are normal. There is no distension.     Palpations: Abdomen is soft. There is no mass.     Tenderness: There is no abdominal tenderness. There is no guarding or rebound.  Musculoskeletal:        General: No tenderness. Normal range of motion.     Cervical back: Normal range of motion and neck supple.  Skin:    General: Skin is warm.     Comments: Bilateral lower extremity ulcerations noted.  Pulses intact bilaterally.  Cystlike lesion noted right elbow-nontender.  No erythema noted.  Neurological:     Mental Status: He is oriented to person, place, and time.     Comments: Sleepy but easily arousable.  Chronic weakness left upper  and lower extremity.  Psychiatric:        Mood and Affect: Affect normal.      LABORATORY DATA:  I have reviewed the data as listed Lab Results  Component Value Date   WBC 5.5 12/29/2022   HGB 12.8 (L) 12/29/2022   HCT 38.9 (L) 12/29/2022   MCV 84.9 12/29/2022   PLT 141 (L) 12/29/2022   Recent Labs    10/28/22 0754 11/04/22 1221 11/19/22 0603 12/08/22 1413 12/18/22 1158 12/29/22 1012  NA 135   < > 135 137  --  136  K 4.0   < > 4.0 3.8  --  3.8  CL 107   < > 109 104  --  107  CO2 24   < >  23 23  --  25  GLUCOSE 91   < > 85 118*  --  117*  BUN 32*   < > 14 21 18 17   CREATININE 1.16   < > 0.86 1.09 0.99 0.98  CALCIUM 8.5*   < > 7.7* 8.6*  --  8.5*  GFRNONAA >60   < > >60 >60 >60 >60  PROT 7.7  --   --  7.6  --  7.5  ALBUMIN 3.2*  --   --  3.4*  --  3.4*  AST 35  --   --  39  --  49*  ALT 21  --   --  20  --  38  ALKPHOS 110  --   --  119  --  121  BILITOT 0.4  --   --  0.4  --  0.5   < > = values in this interval not displayed.    ASSESSMENT & PLAN:   Ureteral cancer, right (HCC) # High-grade urothelial cancer/cytology; likely of the right renal pelvis /upper ureter. On keytruda- APRIL 9th, CT CAP [ER]No definite acute abnormality within the abdomen or pelvis.  Right renal/ureteral findings are similar to the recent prior CT. There is moderate right hydronephrosis with infiltrative appearing soft tissue adjacent to the right renal pelvis and proximal ureter. A right ureteral stent is well positioned extending from the right renal collecting system to the posterior bladder. . Posterior bladder wall irregularity. Dependent 1.5 cm bladder stone. These findings are also similar to the prior CT.   # proceed with [place] today- Labs today reviewed; Stable TSH APRIL 2024--WNL.  Stable. And then will give a break from treatements given multiple other issues/see below. Will order CT CAP today  # PVD/ Left LE cellulitis- on antibiotics [would care]-s/p evaluation with wound care.  S/p antibiotics improved.  Awaiting angiogram with Dr.Dew on 6/6- 2024.   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 - ELEVEN LYMPH NODES, NEGATIVE FOR MALIGNANCY (0/11). - SURGICAL MARGINS FREE OF DYSPLASIA AND MALIGNANCY. S/p staple removal.- stable.   # Iron deficiency anemia-hemoglobin 11-12; FEB - iron  studies-10%; ferritin-27 -hemoglobin 12.7- proceed Venofer.  # Hypocalcemia- Mild- on Ca+vit D; G+FEB 2024- 42. Stable  # Social worker re: insurance/ medicaid   #DISPOSITION: #  today Keytruda; enofer # follow up in 2 months- MD- labs-cbc/cmp;thyroid profile  Keytruda;venofer; CT CAP prior- -Dr.B.    All questions were answered. The patient knows to call the clinic with any problems, questions or concerns.    Earna Coder, MD 12/29/2022 11:38 AM

## 2022-12-30 LAB — T4: T4, Total: 9.4 ug/dL (ref 4.5–12.0)

## 2023-01-01 ENCOUNTER — Encounter: Payer: Self-pay | Admitting: Vascular Surgery

## 2023-01-01 ENCOUNTER — Encounter
Admission: RE | Disposition: A | Discharge status: Home / Self Care | Payer: Self-pay | Source: Home / Self Care | Attending: Vascular Surgery

## 2023-01-01 ENCOUNTER — Other Ambulatory Visit: Payer: Self-pay

## 2023-01-01 ENCOUNTER — Ambulatory Visit
Admission: RE | Admit: 2023-01-01 | Discharge: 2023-01-01 | Disposition: A | Discharge status: Home / Self Care | Payer: Medicaid Other | Attending: Vascular Surgery | Admitting: Vascular Surgery

## 2023-01-01 DIAGNOSIS — Z85038 Personal history of other malignant neoplasm of large intestine: Secondary | ICD-10-CM | POA: Insufficient documentation

## 2023-01-01 DIAGNOSIS — I70248 Atherosclerosis of native arteries of left leg with ulceration of other part of lower left leg: Secondary | ICD-10-CM

## 2023-01-01 DIAGNOSIS — D509 Iron deficiency anemia, unspecified: Secondary | ICD-10-CM | POA: Diagnosis not present

## 2023-01-01 DIAGNOSIS — L97919 Non-pressure chronic ulcer of unspecified part of right lower leg with unspecified severity: Secondary | ICD-10-CM

## 2023-01-01 DIAGNOSIS — C661 Malignant neoplasm of right ureter: Secondary | ICD-10-CM | POA: Diagnosis not present

## 2023-01-01 DIAGNOSIS — I70238 Atherosclerosis of native arteries of right leg with ulceration of other part of lower right leg: Secondary | ICD-10-CM | POA: Diagnosis not present

## 2023-01-01 DIAGNOSIS — I739 Peripheral vascular disease, unspecified: Secondary | ICD-10-CM | POA: Diagnosis present

## 2023-01-01 DIAGNOSIS — L97929 Non-pressure chronic ulcer of unspecified part of left lower leg with unspecified severity: Secondary | ICD-10-CM

## 2023-01-01 DIAGNOSIS — L97909 Non-pressure chronic ulcer of unspecified part of unspecified lower leg with unspecified severity: Secondary | ICD-10-CM

## 2023-01-01 HISTORY — PX: LOWER EXTREMITY ANGIOGRAPHY: CATH118251

## 2023-01-01 SURGERY — LOWER EXTREMITY ANGIOGRAPHY
Anesthesia: Moderate Sedation | Site: Leg Lower | Laterality: Left

## 2023-01-01 MED ORDER — FENTANYL CITRATE (PF) 100 MCG/2ML IJ SOLN
INTRAMUSCULAR | Status: AC
  Filled 2023-01-01: qty 2

## 2023-01-01 MED ORDER — FENTANYL CITRATE (PF) 100 MCG/2ML IJ SOLN
INTRAMUSCULAR | Status: DC | PRN
  Administered 2023-01-01: 50 ug via INTRAVENOUS

## 2023-01-01 MED ORDER — SODIUM CHLORIDE 0.9 % IV SOLN: INTRAVENOUS | Status: DC

## 2023-01-01 MED ORDER — CEFAZOLIN SODIUM-DEXTROSE 2-4 GM/100ML-% IV SOLN: 2.0000 g | INTRAVENOUS | Status: DC

## 2023-01-01 MED ORDER — HEPARIN SOD (PORK) LOCK FLUSH 100 UNIT/ML IV SOLN
INTRAVENOUS | Status: AC
  Filled 2023-01-01: qty 5

## 2023-01-01 MED ORDER — MIDAZOLAM HCL 2 MG/2ML IJ SOLN
INTRAMUSCULAR | Status: AC
  Filled 2023-01-01: qty 2

## 2023-01-01 MED ORDER — ONDANSETRON HCL 4 MG/2ML IJ SOLN: 4.0000 mg | Freq: Four times a day (QID) | INTRAMUSCULAR | Status: DC | PRN

## 2023-01-01 MED ORDER — VANCOMYCIN HCL IN DEXTROSE 1-5 GM/200ML-% IV SOLN
1000.0000 mg | Freq: Once | INTRAVENOUS | Status: AC
  Administered 2023-01-01: 1000 mg via INTRAVENOUS

## 2023-01-01 MED ORDER — HYDROMORPHONE HCL 1 MG/ML IJ SOLN: 1.0000 mg | Freq: Once | INTRAMUSCULAR | Status: DC | PRN

## 2023-01-01 MED ORDER — MIDAZOLAM HCL 2 MG/2ML IJ SOLN
INTRAMUSCULAR | Status: DC | PRN
  Administered 2023-01-01: 2 mg via INTRAVENOUS

## 2023-01-01 MED ORDER — HEPARIN SODIUM (PORCINE) 1000 UNIT/ML IJ SOLN
INTRAMUSCULAR | Status: AC
  Filled 2023-01-01: qty 10

## 2023-01-01 MED ORDER — MIDAZOLAM HCL 2 MG/ML PO SYRP: 8.0000 mg | ORAL_SOLUTION | Freq: Once | ORAL | Status: DC | PRN

## 2023-01-01 MED ORDER — METHYLPREDNISOLONE SODIUM SUCC 125 MG IJ SOLR: 125.0000 mg | Freq: Once | INTRAMUSCULAR | Status: DC | PRN

## 2023-01-01 MED ORDER — VANCOMYCIN HCL IN DEXTROSE 1-5 GM/200ML-% IV SOLN
INTRAVENOUS | Status: AC
  Filled 2023-01-01: qty 200

## 2023-01-01 MED ORDER — FAMOTIDINE 20 MG PO TABS: 40.0000 mg | ORAL_TABLET | Freq: Once | ORAL | Status: DC | PRN

## 2023-01-01 MED ORDER — DIPHENHYDRAMINE HCL 50 MG/ML IJ SOLN: 50.0000 mg | Freq: Once | INTRAMUSCULAR | Status: DC | PRN

## 2023-01-01 SURGICAL SUPPLY — 9 items
CATH ANGIO 5F PIGTAIL 65CM (CATHETERS) IMPLANT
COVER PROBE ULTRASOUND 5X96 (MISCELLANEOUS) IMPLANT
DEVICE STARCLOSE SE CLOSURE (Vascular Products) IMPLANT
GLIDEWIRE ADV .035X260CM (WIRE) IMPLANT
PACK ANGIOGRAPHY (CUSTOM PROCEDURE TRAY) ×1 IMPLANT
SHEATH BRITE TIP 5FRX11 (SHEATH) IMPLANT
SYR MEDRAD MARK 7 150ML (SYRINGE) IMPLANT
TUBING CONTRAST HIGH PRESS 72 (TUBING) IMPLANT
WIRE GUIDERIGHT .035X150 (WIRE) IMPLANT

## 2023-01-01 NOTE — Op Note (Signed)
[facility]  Percutaneous Study/Intervention Procedural Note   Date of Surgery: 01/01/2023  Surgeon(s):Anacristina Steffek    Assistants:none  Pre-operative Diagnosis: PAD with ulceration BLE  Post-operative diagnosis:  Same  Procedure(s) Performed:             1.  Ultrasound guidance for vascular access right femoral artery             2.  Catheter placement into left SFA from right femoral approach             3.  Aortogram and selective bilateral lower extremity angiograms             4.  StarClose closure device right femoral artery  EBL: 10 cc  Contrast: 60 cc  Fluoro Time: 1.7 minutes  Moderate Conscious Sedation Time: approximately 26 minutes using 2 mg of Versed and 50 mcg of Fentanyl              Indications:  Patient is a 62 y.o.male with standing nonhealing ulcerations of both lower extremities and poorly palpable pulses. The patient is brought in for angiography for further evaluation and potential treatment.  Due to the limb threatening nature of the situation, angiogram was performed for attempted limb salvage. The patient is aware that if the procedure fails, amputation would be expected.  The patient also understands that even with successful revascularization, amputation may still be required due to the severity of the situation.  Risks and benefits are discussed and informed consent is obtained.   Procedure:  The patient was identified and appropriate procedural time out was performed.  The patient was then placed supine on the table and prepped and draped in the usual sterile fashion. Moderate conscious sedation was administered during a face to face encounter with the patient throughout the procedure with my supervision of the RN administering medicines and monitoring the patient's vital signs, pulse oximetry, telemetry and mental status throughout from the start of the procedure until the patient was taken to the recovery room. Ultrasound was used to  evaluate the right common femoral artery.  It was patent .  A digital ultrasound image was acquired.  A Seldinger needle was used to access the right common femoral artery under direct ultrasound guidance and a permanent image was performed.  A 0.035 J wire was advanced without resistance and a 5Fr sheath was placed.  Pigtail catheter was placed into the aorta and an AP aortogram was performed. This demonstrated normal renal arteries and normal aorta and iliac segments without significant stenosis. I then crossed the aortic bifurcation and advanced to the left femoral head and then into the left mid SFA to opacify distally. Selective left lower extremity angiogram was then performed. This demonstrated fairly normal common femoral artery, profunda femoris artery, superficial femoral and popliteal arteries without significant disease.  The peroneal artery was large and patent was the only runoff distally.  Both the anterior tibial and posterior tibial arteries were occluded without distal reconstitution.  There is no role for endovascular revascularization and just below the tibial vessels, should as best possible artery.  I elected to go ahead and image the right lower extremity to send second procedure.  This was done to the right femoral sheath.  This demonstrated fairly normal common femoral artery, profunda femoris artery, and superficial femoral and popliteal arteries without significant disease.  Once again, there was a large dominant peroneal artery with tenuous runoff distally with chronic occlusion of the anterior tibial and  posterior tibial arteries without distal reconstitution.  The image quality was poor due to the location of the injection of the patient's continuous motion.  There was no role for endovascular revascularization. Placement I elected to terminate the procedure. The sheath was removed and StarClose closure device was deployed in the right femoral artery with excellent hemostatic result.  The patient was taken to the recovery room in stable condition having tolerated the procedure well.  Findings:               Aortogram:  This demonstrated normal renal arteries and normal aorta and iliac segments without significant stenosis.             Left Lower Extremity:  This demonstrated fairly normal common femoral artery, profunda femoris artery, superficial femoral and popliteal arteries without significant disease.  The peroneal artery was large and patent was the only runoff distally.  Both the anterior tibial and posterior tibial arteries were occluded without distal reconstitution.  Right Lower Extremity:  This demonstrated fairly normal common femoral artery, profunda femoris artery, and superficial femoral and popliteal arteries without significant disease.  Once again, there was a large dominant peroneal artery with tenuous runoff distally with chronic occlusion of the anterior tibial and posterior tibial arteries without distal reconstitution.  The image quality was poor due to the location of the injection of the patient's continuous motion.   Disposition: Patient was taken to the recovery room in stable condition having tolerated the procedure well.  Complications: None  Festus Barren 01/01/2023 10:48 AM   This note was created with Dragon Medical transcription system. Any errors in dictation are purely unintentional.

## 2023-01-01 NOTE — Interval H&P Note (Signed)
History and Physical Interval Note:  01/01/2023 9:01 AM  Chad [name]  has presented today for surgery, with the diagnosis of LLE Angio  BARD   ASO w ulceration  [facility]  [phone].  The various methods of treatment have been discussed with the patient and family. After consideration of risks, benefits and other options for treatment, the patient has consented to  Procedure(s): Lower Extremity Angiography (Left) as a surgical intervention.  The patient's history has been reviewed, patient examined, no change in status, stable for surgery.  I have reviewed the patient's chart and labs.  Questions were answered to the patient's satisfaction.     Festus Barren

## 2023-01-01 NOTE — Progress Notes (Signed)
Patient upset and complaining of back pain, wanted to sit up a little early. He was able to sit up before his D/C time.

## 2023-01-02 ENCOUNTER — Encounter: Payer: Medicaid Other | Attending: Physician Assistant | Admitting: Physician Assistant

## 2023-01-02 DIAGNOSIS — L89613 Pressure ulcer of right heel, stage 3: Secondary | ICD-10-CM | POA: Insufficient documentation

## 2023-01-02 DIAGNOSIS — L97322 Non-pressure chronic ulcer of left ankle with fat layer exposed: Secondary | ICD-10-CM | POA: Diagnosis not present

## 2023-01-02 DIAGNOSIS — L98492 Non-pressure chronic ulcer of skin of other sites with fat layer exposed: Secondary | ICD-10-CM | POA: Insufficient documentation

## 2023-01-02 DIAGNOSIS — Z933 Colostomy status: Secondary | ICD-10-CM | POA: Insufficient documentation

## 2023-01-02 DIAGNOSIS — I69354 Hemiplegia and hemiparesis following cerebral infarction affecting left non-dominant side: Secondary | ICD-10-CM | POA: Insufficient documentation

## 2023-01-02 DIAGNOSIS — I87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 hypertension (idiopathic) with ulcer and inflammation of left lower extremity: Secondary | ICD-10-CM | POA: Diagnosis present

## 2023-01-09 ENCOUNTER — Encounter: Payer: Medicaid Other | Admitting: Physician Assistant

## 2023-01-09 DIAGNOSIS — I87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 hypertension (idiopathic) with ulcer and inflammation of left lower extremity: Secondary | ICD-10-CM | POA: Diagnosis not present

## 2023-01-10 NOTE — Progress Notes (Addendum)
EIN, BUDZ ([id number]) [id number] Page 1 of 16 Visit Report for 01/09/2023 Arrival Information Details Patient Name: Date of Service: Chad [name], Chad [name] 01/09/2023 9:30 A M Medical Record Number: [id number] Patient Account Number: [id number] Date of Birth/Sex: Treating RN: 01-Apr-1961 (62 y.o. Arthur Holms Primary Care Rachelle Edwards: [name] Other Clinician: Referring Daymeon Fischman: Treating Neamiah Sciarra/Extender: [name] in Treatment: 40 Visit Information History Since Last Visit Has Dressing in Place as Prescribed: Yes Patient Arrived: Wheel Chair Pain Present Now: Yes Arrival Time: 09:31 Accompanied By: self Transfer Assistance: Manual Patient Requires Transmission-Based No Precautions: Patient Has Alerts: Yes Patient Alerts: Patient on Blood Thinner NOT diabetic aspirin 81mg  Lives [place] [facility] ABI R 0.81 11/05/22 ABI L 0.59 11/05/22 Electronic Signature(s) Signed: 01/09/2023 12:16:56 PM By: Elliot Gurney, BSN, RN, CWS, Kim RN, BSN Entered By: Elliot Gurney, BSN, RN, CWS, Kim on 01/09/2023 09:33:55 -------------------------------------------------------------------------------- Compression Therapy Details Patient Name: Date of Service: Chad [name], Chad [name] 01/09/2023 9:30 A M Medical Record Number: [id number] Patient Account Number: [id number] Date of Birth/Sex: Treating RN: 29-Oct-1960 (62 y.o. Arthur Holms Primary Care Mikaelyn Arthurs: [name] Other Clinician: Referring Quinley Nesler: Treating Haruna Rohlfs/Extender: [name] Weeks in Treatment: 47 Compression Therapy Performed for Wound Assessment: Wound #15 Left,Dorsal Foot Performed By: Clinician Huel Coventry, RN Compression Type: Henriette Combs Post Procedure Diagnosis Same as Pre-procedure Electronic Signature(s) [name] ([id number]) [id number] Page 2 of 16 Signed: 01/09/2023 11:54:28 AM By: Elliot Gurney, BSN, RN, CWS, Kim RN, BSN Entered By: Elliot Gurney, BSN,  RN, CWS, Kim on 01/09/2023 11:54:28 -------------------------------------------------------------------------------- Compression Therapy Details Patient Name: Date of Service: Chad [name], Chad [name] 01/09/2023 9:30 A M Medical Record Number: [id number] Patient Account Number: [id number] Date of Birth/Sex: Treating RN: June 10, 1961 (62 y.o. Arthur Holms Primary Care Megham Dwyer: [name] Other Clinician: Referring Shann Lewellyn: Treating Kaaren Nass/Extender: [name] Weeks in Treatment: 57 Compression Therapy Performed for Wound Assessment: Wound #12 Left,Distal,Medial Ankle Performed By: Clinician Huel Coventry, RN Compression Type: Henriette Combs Post Procedure Diagnosis Same as Pre-procedure Electronic Signature(s) Signed: 01/09/2023 11:54:28 AM By: Elliot Gurney, BSN, RN, CWS, Kim RN, BSN Entered By: Elliot Gurney, BSN, RN, CWS, Kim on 01/09/2023 11:54:28 -------------------------------------------------------------------------------- Compression Therapy Details Patient Name: Date of Service: Chad [name], Chad [name] 01/09/2023 9:30 A M Medical Record Number: [id number] Patient Account Number: [id number] Date of Birth/Sex: Treating RN: 1960-08-03 (62 y.o. Arthur Holms Primary Care Stiven Kaspar: [name] Other Clinician: Referring Clarine Elrod: Treating Doninique Lwin/Extender: [name] Weeks in Treatment: 8 Compression Therapy Performed for Wound Assessment: Wound #17 Left,Anterior Lower Leg Performed By: Clinician Huel Coventry, RN Compression Type: Henriette Combs Post Procedure Diagnosis Same as Pre-procedure Electronic Signature(s) Signed: 01/09/2023 11:54:29 AM By: Elliot Gurney, BSN, RN, CWS, Kim RN, BSN Entered By: Elliot Gurney, BSN, RN, CWS, Kim on 01/09/2023 11:54:28 [name] ([id number]) [id number] Page 3 of 16 -------------------------------------------------------------------------------- Compression Therapy Details Patient Name: Date of Service: Chad [name], Chad [name]  01/09/2023 9:30 A M Medical Record Number: [id number] Patient Account Number: [id number] Date of Birth/Sex: Treating RN: 1961-04-30 (62 y.o. Arthur Holms Primary Care Aleen Marston: [name] Other Clinician: Referring Jordyn Doane: Treating Khaliq Turay/Extender: [name] Weeks in Treatment: 57 Compression Therapy Performed for Wound Assessment: Wound #19 Left,Posterior Lower Leg Performed By: Clinician Huel Coventry, RN Compression Type: Henriette Combs Post Procedure Diagnosis Same as Pre-procedure Electronic Signature(s) Signed: 01/09/2023 11:54:29 AM By: Elliot Gurney, BSN, RN, CWS, Kim RN, BSN Entered By: Elliot Gurney, BSN, RN, CWS, Kim on 01/09/2023 11:54:29 -------------------------------------------------------------------------------- Compression Therapy Details Patient Name: Date of Service: Chad [name] 01/09/2023 9:30  A M Medical Record Number: [id number] Patient Account Number: [id number] Date of Birth/Sex: Treating RN: 29-Oct-1960 (62 y.o. Arthur Holms Primary Care Fain Francis: [name] Other Clinician: Referring Alexius Ellington: Treating Ryelynn Guedea/Extender: [name] Weeks in Treatment: 8 Compression Therapy Performed for Wound Assessment: Wound #16 Right Calcaneus Performed By: Clinician Huel Coventry, RN Compression Type: Henriette Combs Post Procedure Diagnosis Same as Pre-procedure Electronic Signature(s) Signed: 01/09/2023 11:54:50 AM By: Elliot Gurney, BSN, RN, CWS, Kim RN, BSN Entered By: Elliot Gurney, BSN, RN, CWS, Kim on 01/09/2023 11:54:49 [name] ([id number]) [id number] Page 4 of 16 -------------------------------------------------------------------------------- Encounter Discharge Information Details Patient Name: Date of Service: Chad [name], Chad [name] 01/09/2023 9:30 A M Medical Record Number: [id number] Patient Account Number: [id number] Date of Birth/Sex: Treating RN: March 09, 1961 (62 y.o. Arthur Holms Primary Care Amorah Sebring: [name] Other  Clinician: Referring Elynore Dolinski: Treating Sherril Shipman/Extender: [name] in Treatment: 50 Encounter Discharge Information Items Discharge Condition: Stable Ambulatory Status: Wheelchair Discharge Destination: Home Health Orders Sent: Yes Transportation: Private Auto Schedule Follow-up Appointment: Yes Clinical Summary of Care: Electronic Signature(s) Signed: 01/09/2023 12:16:56 PM By: Elliot Gurney, BSN, RN, CWS, Kim RN, BSN Entered By: Elliot Gurney, BSN, RN, CWS, Kim on 01/09/2023 10:45:04 -------------------------------------------------------------------------------- Lower Extremity Assessment Details Patient Name: Date of Service: Chad [name], Chad [name] 01/09/2023 9:30 A M Medical Record Number: [id number] Patient Account Number: [id number] Date of Birth/Sex: Treating RN: 01-05-1961 (62 y.o. Arthur Holms Primary Care Gerrad Welker: [name] Other Clinician: Referring Kamdin Follett: Treating Estefani Bateson/Extender: [name] Weeks in Treatment: 57 Edema Assessment Assessed: [Left: No] [Right: No] [Left: Edema] [Right: :] Calf Left: Right: Point of Measurement: 33 cm From Medial Instep 39 cm 38 cm Ankle Left: Right: Point of Measurement: 12 cm From Medial Instep 23.5 cm 23 cm Vascular Assessment Pulses: Mcmurtrey, [name] ([id number]) [Right:[id number] Page 5 of 16] Dorsalis Pedis Palpable: [Left:No Yes] [Right:No Yes] Electronic Signature(s) Signed: 01/09/2023 12:16:56 PM By: Elliot Gurney, BSN, RN, CWS, Kim RN, BSN Entered By: Elliot Gurney, BSN, RN, CWS, Kim on 01/09/2023 51:76:16 -------------------------------------------------------------------------------- Multi Wound Chart Details Patient Name: Date of Service: Chad [name], Chad [name] 01/09/2023 9:30 A M Medical Record Number: [id number] Patient Account Number: [id number] Date of Birth/Sex: Treating RN: 1961-07-14 (62 y.o. Loel Lofty, Selena Batten Primary Care Lizzett Nobile: [name] Other Clinician: Referring  Ellington Cornia: Treating Gevorg Brum/Extender: [name] Weeks in Treatment: 52 Vital Signs Height(in): 69 Pulse(bpm): 94 Weight(lbs): 150 Blood Pressure(mmHg): 157/89 Body Mass Index(BMI): 22.1 Temperature(F): 97.7 Respiratory Rate(breaths/min): 16 [12:Photos:] Left, Distal, Medial Ankle Left, Dorsal Foot Right Calcaneus Wound Location: Gradually Appeared Gradually Appeared Gradually Appeared Wounding Event: Venous Leg Ulcer Venous Leg Ulcer Pressure Ulcer Primary Etiology: 07/12/2019 10/01/2022 09/26/2022 Date Acquired: 57 13 13 Weeks of Treatment: Open Open Open Wound Status: No No No Wound Recurrence: No Yes Yes Clustered Wound: 2.5x1.4x0.33 4x2.5x0.4 2.5x2x0.3 Measurements L x W x D (cm) 2.749 7.854 3.927 A (cm) : rea 0.907 3.142 1.178 Volume (cm) : 60.20% -233.40% -316.90% % Reduction in A rea: 34.40% -1231.40% -526.60% % Reduction in Volume: Full Thickness Without Exposed Full Thickness Without Exposed Category/Stage III Classification: Support Structures Support Structures Medium Medium Medium Exudate Amount: Serosanguineous Serosanguineous Serosanguineous Exudate Type: red, brown red, brown red, brown Exudate Color: Wound Number: 17 18 19  PhotosARCHIBALD, MAGNIFICO ([id number]) [id number] Page 6 of 16 Left, Anterior Lower Leg Right, Medial T Second oe Left, Posterior Lower Leg Wound Location: Pressure Injury Pressure Injury Gradually Appeared Wounding Event: Pressure Ulcer Pressure Ulcer Venous Leg Ulcer Primary Etiology: 09/30/2022 09/26/2022 11/10/2022 Date Acquired: 12 12  8 Weeks of Treatment: Open Open Open Wound Status: No No No Wound Recurrence: No No Yes Clustered Wound: 3.5x2.8x0.3 0.7x0.5x0.1 1.2x1.2x0.7 Measurements L x W x D (cm) 7.697 0.275 1.131 A (cm) : rea 2.309 0.027 0.792 Volume (cm) : -86.70% 50.00% -60.00% % Reduction in A rea: -460.40% 50.90% -1015.50% % Reduction in  Volume: Category/Stage II Category/Stage II Full Thickness Without Exposed Classification: Support Structures Medium Medium Medium Exudate Amount: Serosanguineous Serosanguineous Serous Exudate Type: red, brown red, brown amber Exudate Color: Treatment Notes Electronic Signature(s) Signed: 01/09/2023 12:16:56 PM By: Elliot Gurney, BSN, RN, CWS, Kim RN, BSN Entered By: Elliot Gurney, BSN, RN, CWS, Kim on 01/09/2023 10:09:00 -------------------------------------------------------------------------------- Multi-Disciplinary Care Plan Details Patient Name: Date of Service: DEANTAE, RIEDEMANN 01/09/2023 9:30 A M Medical Record Number: [id number] Patient Account Number: [id number] Date of Birth/Sex: Treating RN: 06-Nov-1960 (62 y.o. Arthur Holms Primary Care Mallie Linnemann: [name] Other Clinician: Referring Sonya Gunnoe: Treating Loriana Samad/Extender: [name] Weeks in Treatment: 73 Active Inactive Venous Leg Ulcer Nursing Diagnoses: Knowledge deficit related to disease process and management Goals: Patient will maintain optimal edema control Date Initiated: 12/06/2021 Target Resolution Date: 01/03/2022 Goal Status: Active Patient/caregiver will verbalize understanding of disease process and disease management Date Initiated: 12/06/2021 Date Inactivated: 12/19/2022 Target Resolution Date: 11/27/2022 Goal Status: Met Interventions: Assess peripheral edema status every visit. Compression as ordered Notes: Electronic Signature(s) Signed: 01/09/2023 11:56:13 AM By: Elliot Gurney, BSN, RN, CWS, Kim RN, BSN Entered By: Elliot Gurney, BSN, RN, CWS, Kim on 01/09/2023 11:56:13 [name] ([id number]) [id number] Page 7 of 16 -------------------------------------------------------------------------------- Pain Assessment Details Patient Name: Date of Service: Chad [name], Chad [name] 01/09/2023 9:30 A M Medical Record Number: [id number] Patient Account Number: [id number] Date of Birth/Sex:  Treating RN: 1960-12-14 (62 y.o. Arthur Holms Primary Care Shai Mckenzie: [name] Other Clinician: Referring Camdin Hegner: Treating Stephanie Mcglone/Extender: [name] in Treatment: 28 Active Problems Location of Pain Severity and Description of Pain Patient Has Paino Yes Site Locations Pain Location: Pain in Ulcers With Dressing Change: Yes Rate the pain. Current Pain Level: 7 Character of Pain Describe the Pain: Shooting, Throbbing Pain Management and Medication Current Pain Management: Electronic Signature(s) Signed: 01/09/2023 12:16:56 PM By: Elliot Gurney, BSN, RN, CWS, Kim RN, BSN Entered By: Elliot Gurney, BSN, RN, CWS, Kim on 01/09/2023 09:34:48 -------------------------------------------------------------------------------- Patient/Caregiver Education Details Patient Name: Date of Service: Chad [name], Chad [name] 6/14/2024andnbsp9:30 A M Medical Record Number: [id number] Patient Account Number: [id number] ANGELOS, OTTMAR ([id number]) [id number] Page 8 of 16 Date of Birth/Gender: Treating RN: 30-Nov-1960 (62 y.o. Arthur Holms Primary Care Physician: [name] Other Clinician: Referring Physician: Treating Physician/Extender: [name] in Treatment: 34 Education Assessment Education Provided To: Patient Education Topics Provided Wound/Skin Impairment: Handouts: Caring for Your Ulcer Methods: Demonstration, Explain/Verbal Responses: State content correctly Electronic Signature(s) Signed: 01/09/2023 12:16:56 PM By: Elliot Gurney, BSN, RN, CWS, Kim RN, BSN Entered By: Elliot Gurney, BSN, RN, CWS, Kim on 01/09/2023 11:56:34 -------------------------------------------------------------------------------- Wound Assessment Details Patient Name: Date of Service: Chad [name], Chad [name] 01/09/2023 9:30 A M Medical Record Number: [id number] Patient Account Number: [id number] Date of Birth/Sex: Treating RN: July 01, 1961 (62 y.o. Arthur Holms Primary Care  Trine Fread: [name] Other Clinician: Referring Jaquana Geiger: Treating Treylan Mcclintock/Extender: [name] Weeks in Treatment: 57 Wound Status Wound Number: 12 Primary Etiology: Venous Leg Ulcer Wound Location: Left, Distal, Medial Ankle Wound Status: Open Wounding Event: Gradually Appeared Date Acquired: 07/12/2019 Weeks Of Treatment: 57 Clustered Wound: No Photos Photo Uploaded By: Elliot Gurney, BSN, RN, CWS, Kim on 01/09/2023 09:58:42 Wound  Measurements Length: (cm) 2.5 Width: (cm) 1.4 Depth: (cm) 0.33 Area: (cm) 2.749 Volume: (cm) 0.907 Chianese, Zebbie ([id number]) % Reduction in Area: 60.2% % Reduction in Volume: 34.4% [id number] Page 9 of 16 Wound Description Classification: Full Thickness Without Exposed Support Exudate Amount: Medium Exudate Type: Serosanguineous Exudate Color: red, brown Structures Treatment Notes Wound #12 (Ankle) Wound Laterality: Left, Medial, Distal Cleanser Soap and Water Discharge Instruction: Gently cleanse wound with antibacterial soap, rinse and pat dry prior to dressing wounds Wound Cleanser Discharge Instruction: Wash your hands with soap and water. Remove old dressing, discard into plastic bag and place into trash. Cleanse the wound with Wound Cleanser prior to applying a clean dressing using gauze sponges, not tissues or cotton balls. Do not scrub or use excessive force. Pat dry using gauze sponges, not tissue or cotton balls. Peri-Wound Care Topical Primary Dressing Silvercel Small 2x2 (in/in) Discharge Instruction: Apply Silvercel Small 2x2 (in/in) as instructed Secondary Dressing Zetuvit Plus 4x8 (in/in) Secured With Compression Wrap UNNA BOOT - Unna-Z Zinc and Calamine Boot, 4x10 (in/yd) Discharge Instruction: Unna Paste, Kerlix and Coban from base of toes to three finger widths below bend in knee. Compression Stockings Facilities manager) Signed: 01/09/2023 12:16:56 PM By: Elliot Gurney,  BSN, RN, CWS, Kim RN, BSN Entered By: Elliot Gurney, BSN, RN, CWS, Kim on 01/09/2023 09:52:01 -------------------------------------------------------------------------------- Wound Assessment Details Patient Name: Date of Service: Chad [name], Chad [name] 01/09/2023 9:30 A M Medical Record Number: [id number] Patient Account Number: [id number] Date of Birth/Sex: Treating RN: November 20, 1960 (62 y.o. Arthur Holms Primary Care Sadao Weyer: [name] Other Clinician: Referring Khaleel Beckom: Treating Bralen Wiltgen/Extender: [name] Weeks in Treatment: 57 Wound Status Wound Number: 15 Primary Etiology: Venous Leg Ulcer Wound Location: Left, Dorsal Foot Wound Status: Open Wounding Event: Gradually Appeared Date Acquired: 10/01/2022 Weeks Of Treatment: 13 Clustered Wound: Yes Photos [name] ([id number]) [id number] Page 10 of 16 Photo Uploaded By: Elliot Gurney, BSN, RN, CWS, Kim on 01/09/2023 09:58:43 Wound Measurements Length: (cm) 4 Width: (cm) 2.5 Depth: (cm) 0.4 Area: (cm) 7.854 Volume: (cm) 3.142 % Reduction in Area: -233.4% % Reduction in Volume: -1231.4% Wound Description Classification: Full Thickness Without Exposed Support Exudate Amount: Medium Exudate Type: Serosanguineous Exudate Color: red, brown Structures Treatment Notes Wound #15 (Foot) Wound Laterality: Dorsal, Left Cleanser Soap and Water Discharge Instruction: Gently cleanse wound with antibacterial soap, rinse and pat dry prior to dressing wounds Wound Cleanser Discharge Instruction: Wash your hands with soap and water. Remove old dressing, discard into plastic bag and place into trash. Cleanse the wound with Wound Cleanser prior to applying a clean dressing using gauze sponges, not tissues or cotton balls. Do not scrub or use excessive force. Pat dry using gauze sponges, not tissue or cotton balls. Peri-Wound Care Topical Primary Dressing Silvercel Small 2x2 (in/in) Discharge Instruction:  Apply Silvercel Small 2x2 (in/in) as instructed Secondary Dressing Zetuvit Plus 4x8 (in/in) Secured With Compression Wrap UNNA BOOT - Unna-Z Zinc and Calamine Boot, 4x10 (in/yd) Discharge Instruction: Unna Paste, Kerlix and Coban from base of toes to three finger widths below bend in knee. Compression Stockings Facilities manager) Signed: 01/09/2023 12:16:56 PM By: Elliot Gurney, BSN, RN, CWS, Kim RN, BSN Entered By: Elliot Gurney, BSN, RN, CWS, Kim on 01/09/2023 09:52:01 [name] ([id number]) [id number] Page 11 of 16 -------------------------------------------------------------------------------- Wound Assessment Details Patient Name: Date of Service: TOLLIE, GARGUS 01/09/2023 9:30 A M Medical Record Number: [id number] Patient Account Number: [id number] Date of Birth/Sex: Treating RN: [birth year] (61 y.o. Arthur Holms Primary Care Mechel Haggard: [name]  Other Clinician: Referring Patria Warzecha: Treating Tonimarie Gritz/Extender: [name] in Treatment: 57 Wound Status Wound Number: 16 Primary Etiology: Pressure Ulcer Wound Location: Right Calcaneus Wound Status: Open Wounding Event: Gradually Appeared Date Acquired: 09/26/2022 Weeks Of Treatment: 13 Clustered Wound: Yes Photos Photo Uploaded By: Elliot Gurney, BSN, RN, CWS, Kim on 01/09/2023 09:58:43 Wound Measurements Length: (cm) 2.5 Width: (cm) 2 Depth: (cm) 0.3 Area: (cm) 3.927 Volume: (cm) 1.178 % Reduction in Area: -316.9% % Reduction in Volume: -526.6% Wound Description Classification: Category/Stage III Exudate Amount: Medium Exudate Type: Serosanguineous Exudate Color: red, brown Treatment Notes Wound #16 (Calcaneus) Wound Laterality: Right Cleanser Soap and Water Discharge Instruction: Gently cleanse wound with antibacterial soap, rinse and pat dry prior to dressing wounds Wound Cleanser Discharge Instruction: Wash your hands with soap and water. Remove old dressing,  discard into plastic bag and place into trash. Cleanse the wound with Wound Cleanser prior to applying a clean dressing using gauze sponges, not tissues or cotton balls. Do not scrub or use excessive force. Pat dry using gauze sponges, not tissue or cotton balls. Peri-Wound Care Topical Primary Dressing Silvercel Small 2x2 (in/in) ROCKWELL, ZELDIN ([id number]) [id number] Page 12 of 16 Discharge Instruction: Apply Silvercel Small 2x2 (in/in) as instructed Secondary Dressing Zetuvit Plus 4x8 (in/in) Secured With Compression Wrap UNNA BOOT - Unna-Z Zinc and Calamine Boot, 4x10 (in/yd) Discharge Instruction: Unna Paste, Kerlix and Coban from base of toes to three finger widths below bend in knee. Compression Stockings Facilities manager) Signed: 01/09/2023 12:16:56 PM By: Elliot Gurney, BSN, RN, CWS, Kim RN, BSN Entered By: Elliot Gurney, BSN, RN, CWS, Kim on 01/09/2023 09:52:01 -------------------------------------------------------------------------------- Wound Assessment Details Patient Name: Date of Service: YORMAN, CRASE 01/09/2023 9:30 A M Medical Record Number: [id number] Patient Account Number: [id number] Date of Birth/Sex: Treating RN: 1961-01-16 (62 y.o. Arthur Holms Primary Care Rei Medlen: [name] Other Clinician: Referring Laquandra Carrillo: Treating Jaloni Davoli/Extender: [name] Weeks in Treatment: 57 Wound Status Wound Number: 17 Primary Etiology: Pressure Ulcer Wound Location: Left, Anterior Lower Leg Wound Status: Open Wounding Event: Pressure Injury Date Acquired: 09/30/2022 Weeks Of Treatment: 12 Clustered Wound: No Photos Photo Uploaded By: Elliot Gurney, BSN, RN, CWS, Kim on 01/09/2023 09:58:44 Wound Measurements Length: (cm) 3.5 Width: (cm) 2.8 Depth: (cm) 0.3 Area: (cm) 7.697 Volume: (cm) 2.309 % Reduction in Area: -86.7% % Reduction in Volume: -460.4% Wound Description Classification: Category/Stage II Exudate Amount:  Medium Exudate Type: Serosanguineous Tranchina, Marvelle ([id number]) Exudate Color: red, brown [id number] Page 13 of 16 Treatment Notes Wound #17 (Lower Leg) Wound Laterality: Left, Anterior Cleanser Soap and Water Discharge Instruction: Gently cleanse wound with antibacterial soap, rinse and pat dry prior to dressing wounds Wound Cleanser Discharge Instruction: Wash your hands with soap and water. Remove old dressing, discard into plastic bag and place into trash. Cleanse the wound with Wound Cleanser prior to applying a clean dressing using gauze sponges, not tissues or cotton balls. Do not scrub or use excessive force. Pat dry using gauze sponges, not tissue or cotton balls. Peri-Wound Care Topical Primary Dressing Silvercel Small 2x2 (in/in) Discharge Instruction: Apply Silvercel Small 2x2 (in/in) as instructed Secondary Dressing Zetuvit Plus 4x8 (in/in) Secured With Compression Wrap UNNA BOOT - Unna-Z Zinc and Calamine Boot, 4x10 (in/yd) Discharge Instruction: Unna Paste, Kerlix and Coban from base of toes to three finger widths below bend in knee. Compression Stockings Facilities manager) Signed: 01/09/2023 12:16:56 PM By: Elliot Gurney, BSN, RN, CWS, Kim RN, BSN Entered By: Elliot Gurney, BSN, RN, CWS, Kim on 01/09/2023 09:52:02 --------------------------------------------------------------------------------  Wound Assessment Details Patient Name: Date of Service: KAPTAIN, CRUISE 01/09/2023 9:30 A M Medical Record Number: [id number] Patient Account Number: [id number] Date of Birth/Sex: Treating RN: 04-11-1961 (62 y.o. Arthur Holms Primary Care Farrah Skoda: [name] Other Clinician: Referring Braysen Cloward: Treating Tehila Sokolow/Extender: [name] Weeks in Treatment: 57 Wound Status Wound Number: 18 Primary Etiology: Pressure Ulcer Wound Location: Right, Medial T Second oe Wound Status: Open Wounding Event: Pressure Injury Date  Acquired: 09/26/2022 Weeks Of Treatment: 12 Clustered Wound: No Photos NAVI, SUGUITAN ([id number]) [id number] Page 14 of 16 Photo Uploaded By: Elliot Gurney, BSN, RN, CWS, Kim on 01/09/2023 09:58:44 Wound Measurements Length: (cm) 0.7 Width: (cm) 0.5 Depth: (cm) 0.1 Area: (cm) 0.275 Volume: (cm) 0.027 % Reduction in Area: 50% % Reduction in Volume: 50.9% Wound Description Classification: Category/Stage II Exudate Amount: Medium Exudate Type: Serosanguineous Exudate Color: red, brown Treatment Notes Wound #18 (Toe Second) Wound Laterality: Right, Medial Cleanser Soap and Water Discharge Instruction: Gently cleanse wound with antibacterial soap, rinse and pat dry prior to dressing wounds Wound Cleanser Discharge Instruction: Wash your hands with soap and water. Remove old dressing, discard into plastic bag and place into trash. Cleanse the wound with Wound Cleanser prior to applying a clean dressing using gauze sponges, not tissues or cotton balls. Do not scrub or use excessive force. Pat dry using gauze sponges, not tissue or cotton balls. Peri-Wound Care Topical Primary Dressing Silvercel Small 2x2 (in/in) Discharge Instruction: Apply Silvercel Small 2x2 (in/in) as instructed Secondary Dressing Coverlet Latex-Free Fabric Adhesive Dressings Discharge Instruction: Knuckle Secured With Compression Wrap Compression Stockings Add-Ons Electronic Signature(s) Signed: 01/09/2023 12:16:56 PM By: Elliot Gurney, BSN, RN, CWS, Kim RN, BSN Entered By: Elliot Gurney, BSN, RN, CWS, Kim on 01/09/2023 09:52:02 [name] ([id number]) [id number] Page 15 of 16 -------------------------------------------------------------------------------- Wound Assessment Details Patient Name: Date of Service: ANREW, BUCHKO 01/09/2023 9:30 A M Medical Record Number: [id number] Patient Account Number: [id number] Date of Birth/Sex: Treating RN: 28-Jul-1961 (62 y.o. Arthur Holms Primary Care Millissa Deese: [name] Other Clinician: Referring Jacson Rapaport: Treating Josselin Gaulin/Extender: [name] Weeks in Treatment: 57 Wound Status Wound Number: 19 Primary Etiology: Venous Leg Ulcer Wound Location: Left, Posterior Lower Leg Wound Status: Open Wounding Event: Gradually Appeared Date Acquired: 11/10/2022 Weeks Of Treatment: 8 Clustered Wound: Yes Photos Photo Uploaded By: Elliot Gurney, BSN, RN, CWS, Kim on 01/09/2023 09:58:44 Wound Measurements Length: (cm) 1.2 Width: (cm) 1.2 Depth: (cm) 0.7 Area: (cm) 1.131 Volume: (cm) 0.792 % Reduction in Area: -60% % Reduction in Volume: -1015.5% Wound Description Classification: Full Thickness Without Exposed Support Exudate Amount: Medium Exudate Type: Serous Exudate Color: amber Structures Treatment Notes Wound #19 (Lower Leg) Wound Laterality: Left, Posterior Cleanser Soap and Water Discharge Instruction: Gently cleanse wound with antibacterial soap, rinse and pat dry prior to dressing wounds Wound Cleanser Discharge Instruction: Wash your hands with soap and water. Remove old dressing, discard into plastic bag and place into trash. Cleanse the wound with Wound Cleanser prior to applying a clean dressing using gauze sponges, not tissues or cotton balls. Do not scrub or use excessive force. Pat dry using gauze sponges, not tissue or cotton balls. Peri-Wound Care Topical Primary Dressing Silvercel Small 2x2 (in/in) ALGIRDAS, SOINE ([id number]) [id number] Page 16 of 16 Discharge Instruction: Apply Silvercel Small 2x2 (in/in) as instructed Secondary Dressing Zetuvit Plus 4x8 (in/in) Secured With Compression Wrap UNNA BOOT - Unna-Z Zinc and Calamine Boot, 4x10 (in/yd) Discharge Instruction: Unna Paste, Kerlix and Coban from base of toes to three finger widths  below bend in knee. Compression Stockings Facilities manager) Signed: 01/09/2023 12:16:56 PM  By: Elliot Gurney, BSN, RN, CWS, Kim RN, BSN Entered By: Elliot Gurney, BSN, RN, CWS, Kim on 01/09/2023 09:52:02 -------------------------------------------------------------------------------- Vitals Details Patient Name: Date of Service: FRASIER, SPRINGBORN 01/09/2023 9:30 A M Medical Record Number: [id number] Patient Account Number: [id number] Date of Birth/Sex: Treating RN: 1960/12/30 (62 y.o. Loel Lofty, Selena Batten Primary Care Adrina Armijo: [name] Other Clinician: Referring Juleon Narang: Treating Thaddaeus Granja/Extender: [name] Weeks in Treatment: 82 Vital Signs Time Taken: 09:33 Temperature (F): 97.7 Height (in): 69 Pulse (bpm): 94 Weight (lbs): 150 Respiratory Rate (breaths/min): 16 Body Mass Index (BMI): 22.1 Blood Pressure (mmHg): 157/89 Reference Range: 80 - 120 mg / dl Electronic Signature(s) Signed: 01/09/2023 12:16:56 PM By: Elliot Gurney, BSN, RN, CWS, Kim RN, BSN Entered By: Elliot Gurney, BSN, RN, CWS, Kim on 01/09/2023 09:34:32

## 2023-01-10 NOTE — Progress Notes (Signed)
DEAKIN, AFFELDT ([id number]) [id number] Page 1 of 15 Visit Report for 01/09/2023 Chief Complaint Document Details Patient Name: Date of Service: Chad [name], Chad [name] 01/09/2023 9:30 A M Medical Record Number: [id number] Patient Account Number: [id number] Date of Birth/Sex: Treating RN: 27-Mar-1961 (62 y.o. Chad [name] Primary Care Provider: Cyril [name] Other Clinician: Referring Provider: Treating Provider/Extender: [name] Weeks in Treatment: 102 Information Obtained from: Patient Chief Complaint Left ankle ulcer, Left dorsal foot wound, right heel Electronic Signature(s) Signed: 01/09/2023 9:21:38 AM By: [name] PA-C Entered By: [name] on 01/09/2023 09:21:38 -------------------------------------------------------------------------------- HPI Details Patient Name: Date of Service: Chad [name] 01/09/2023 9:30 A M Medical Record Number: [id number] Patient Account Number: [id number] Date of Birth/Sex: Treating RN: May 25, 1961 (62 y.o. Chad [name] Primary Care Provider: Cyril [name] Other Clinician: Referring Provider: Treating Provider/Extender: [name] in Treatment: 15 History of Present Illness HPI Description: 10/08/18 on evaluation today patient actually presents to our office for initial evaluation concerning wounds that he has of the bilateral lower extremities. 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 He also has bilateral venous stasis. He apparently has been homeless currently following discharge from the hospital apparently he has been placed at [facility] which is is a skilled nursing facility locally. Nonetheless fortunately he does not show any signs of infection at this time which is good news. In fact several of the  wound actually appears to be showing some signs of improvement already in my pinion. There are a couple areas in the left leg in particular there likely gonna require some sharp debridement to help clear away some necrotic tissue and help with more sufficient healing. No fevers, chills, nausea, or vomiting noted at this time. 10/15/18 on evaluation today patient actually appears to be doing very well in regard to his bilateral lower extremities. He's been tolerating the dressing changes without complication. Fortunately there does not appear to be any evidence of active infection at this time which is great news. Overall I'm actually very pleased with how this has progressed in just one visits time. Readmission: 08/14/2020 upon evaluation today patient presents for re-evaluation here in our clinic. He is having issues with his left ankle region as well as his right toe and his right heel. He tells me that the toe and heel actually began as a area that was itching that he was scratching and then subsequently opened up into wounds. These may have been abscess areas I presume based on what I am seeing currently. With regard to his left ankle region he tells me this was a similar type [name], [name] ([id number]) [id number] Page 2 of 15 occurrence although he does have venous stasis this very well may be more of a venous leg ulcer more than anything. Nonetheless I do believe that the patient would benefit from appropriate and aggressive wound care to try to help get things under better control here. He does have history of a stroke on the left side affecting him to some degree there that he is able to stand although he does have some residual weakness. Otherwise again the patient does have chronic venous insufficiency as previously noted. His arterial studies most recently obtained showed that he had an ABI on the right of 1.16 with a TBI of 0.52 and on the left and ABI of 1.14  with a TBI of 0.81. That was obtained on 06/19/2020. 08/28/2020 upon evaluation today patient appears to be doing decently well in regard to his wounds in general. He has been tolerating the dressing changes without complication. Fortunately there does not appear to be any signs of active infection which is great news. With that being said I think the [facility] is doing a good job I would recommend that we likely continue with that currently. 09/11/2020 upon evaluation today patient's wounds did not appear to be doing too poorly but again he is not really showing signs of significant improvement with regard to any of the wounds on the right. None of them have Hydrofera Blue on them I am not exactly sure why this is not being followed as the facility did not contact us to let us know of any issues with obtaining dressings or otherwise. With that being said he is supposed to be using Hydrofera Blue on both of the wounds on the right foot as well as the ankle wound on the left side. 09/18/2020 upon evaluation today patient appears to be doing poorly with regard to his wounds. Again right now the left ankle in particular showed signs of extreme maceration. Apparently he was told by someone with staff at [facility] they could not get the [facility] With that being said this is something that is never been relayed to [place] one way or another. Also the patient subsequently has not supposed to have a border gauze dressing on. He should have an ABD pad and roll gauze to secure as this drains much too much just to have a border gauze dressing to cover. Nonetheless the fact that they are not using the appropriate dressing is directly causing deterioration of the left ankle wound it is significantly worse today compared to what it was previous. I did attempt to call [facility] while the patient was here I called three times and got no one to even pick up the phone. After this I had my for an  office coordinator call and she was able to finally get through and leave a message with the D ON as of dictation of this note which is roughly about an hour and a half later I still have not been able to speak with anyone at the facility. 09/25/2020 upon evaluation today patient actually showing signs of good improvement which is excellent news. He has been tolerating the dressing changes without complication. Fortunately there is no signs of active infection which is great news. No fevers, chills, nausea, vomiting, or diarrhea. I do feel like the facility has been doing a much better job at taking care of him as far as the dressings are concerned. However the director of nursing never did call me back. 10/09/2020 upon evaluation today patient appears to be doing well with regard to his wound. The toe ulcer did require some debridement but the other 2 areas actually appear to be doing quite well. 10/19/2020 upon evaluation today patient actually appears to be doing very well in regard to his wounds. In fact the heel does appear to be completely healed. The toe is doing better in the medial ankle on the left is also doing better. Overall I think he is headed in the right direction. 10/26/2020 upon evaluation today patient appears to be doing well with regard to his wound. He is showing signs of improvement which is great news and overall I am very pleased with where things stand today. No  fevers, chills, nausea, vomiting, or diarrhea. 11/02/2020 upon evaluation today patient appears to be doing well with regard to his wounds. He has been tolerating the dressing changes without complication overall I am extremely pleased with where things stand today. He in regard to the toe is almost completely healed and the medial ankle on the left is doing much better. 11/09/2020 upon evaluation today patient appears to be doing a little poorly in regard to his left medial ankle ulcer. Fortunately there does not appear to  be any signs of systemic infection but unfortunately locally he does appear to be infected in fact he has blue-green drainage consistent with Pseudomonas. 11/16/2020 upon evaluation today patient appears to be doing well with regard to his wound. It actually appears to be doing better. I did place him on gentamicin cream since the Cipro was actually resistant even though he was positive for Pseudomonas on culture. Overall I think that he does seem to be doing better though I am unsure whether or not they have actually been putting the cream on. The patient is not sure that we did talk to the nurse directly and she was going to initiate that treatment. Fortunately there does not appear to be any signs of active infection at this time. No fevers, chills, nausea, vomiting, or diarrhea. 4/28; the area on the right second toe is close to healed. Left medial ankle required debridement 12/07/2020 upon evaluation today patient appears to be doing well with regard to his wounds. In fact the right second toe appears to be completely healed which is great news. Fortunately there does not appear to be any signs of active infection at this time which is also great news. I think we can probably discontinue the gentamicin on top of everything else. 12/14/2020 upon evaluation today patient appears to be doing well with regard to his wound. He is making good progress and overall very pleased with where things stand today. There is no signs of active infection at this time which is great news. 12/28/2020 upon evaluation today patient appears to be doing well with regard to his wounds. He has been tolerating the dressing changes without complication. Fortunately there is no signs of active infection at this time. No fevers, chills, nausea, vomiting, or diarrhea. 12/28/2020 upon evaluation today patient's wound bed actually showed signs of excellent improvement. He has great epithelization and granulation I do not see any  signs of infection overall I am extremely pleased with where things stand at this point. No fevers, chills, nausea, vomiting, or diarrhea. 01/11/2021 upon evaluation today patient appears to be doing well with regard to his wound on his leg. He has been tolerating the dressing changes without complication. Fortunately there does not appear to be any signs of active infection which is great news. No fevers, chills, nausea, vomiting, or diarrhea. 01/25/2021 upon evaluation today patient appears to be doing well with regard to his wound. He has been tolerating the dressing changes without complication. Fortunately the collagen seems to be doing a great job which is excellent news. No fevers, chills, nausea, vomiting, or diarrhea. 02/08/2021 upon evaluation today patient's wound is actually looking a little bit worse especially in the periwound compared to previous. Fortunately there does not appear to be any signs of infection which is great news with that being said he does have some irritation around the periphery of the wound which has me more concerned. He actually had a dressing on that had not been changed in 3  days. He also is supposed to have daily dressing changes. With regard to the dressing applied he had a silver alginate dressing and silver collagen is what is recommended and ordered. He also had no Desitin around the edges of the wound in the periwound region although that is on the order inspect to be done as well. In general I was very concerned I did contact [facility] actually spoke with Verlon Au who is the scheduling individual and subsequently she stated that she would pass the information to the D ON apparently the D ON was not available to talk to me when I call today. 02/18/2021 upon evaluation today patient's wound is actually showing signs of improvement. Fortunately there does not appear to be any evidence of infection which is great news overall I am extremely pleased with  where things stand today. No fevers, chills, nausea, vomiting, or diarrhea. 8/3; patient presents for 1 week follow-up. He has no issues or complaints today. He denies signs of infection. 03/11/2021 upon evaluation today patient appears to be doing well with regard to his wound. He does have a little bit of slough noted on the surface of the wound but fortunately there does not appear to be any signs of active infection at this time. No fevers, chills, nausea, vomiting, or diarrhea. 03/18/2021 upon evaluation today patient appears to be doing well with regard to his wound. He has been tolerating the dressing changes without complication. There was a little irritation more proximal to where the wound was that was not noted last week but nonetheless this is very superficial just seems to be more irritation we just need to make sure to put a good amount of the zinc over the area in my opinion. Otherwise he does not seem to be doing significantly worse at all which is great news. 03/25/2021 upon evaluation today patient appears to be doing well with regard to his wound. He is going require some sharp debridement today to clear with LIEV, CHALUPA ([id number]) [id number] Page 3 of 15 some of the necrotic debris. I did perform this today without complication postdebridement wound bed appears to be doing much better this is great news. 04/08/2021 upon evaluation today patient appears to be doing decently well in regard to his wound although the overall measurement is not significantly smaller compared to previous. It is gone down a little bit but still the facility continues to not really put the appropriate dressings in place in fact he was supposed to have collagen we think he probably had more of an allergy to At this point. Fortunately there does not appear to be any signs of active infection systemically though locally I do not see anything on initial visualization either as  far as erythema or warmth. 04/15/2021 upon evaluation today patient appears to be doing well with regard to his wound. He is actually showing signs of improvement. I did place him on antibiotics last week, Cipro. He has been taking that 2 times a day and seems to be tolerating it very well. I do not see any evidence of worsening and in fact the overall appearance of the wound is smaller today which is also great news. 9/26; left medial ankle chronic venous insufficiency wound is improved. Using Hydrofera Blue 10/10; left medial ankle chronic venous insufficiency. Wound has not changed much in appearance completely nonviable surface. Apparently there have been problems getting the right product on the wound at the facility although he came in with [facility] on today  05/14/2021 upon evaluation today patient appears to be doing well with regard to his wound. I think he is making progress here which is good news. Fortunately there does not appear to be any signs of active infection at this time. No fevers, chills, nausea, vomiting, or diarrhea. 05/20/2021 upon evaluation today patient appears to be doing well with regard to his wound. He is showing signs of good improvement which is great news. There does not appear to be any evidence of active infection which is also excellent news. No fevers, chills, nausea, vomiting, or diarrhea. 05/28/2021 upon evaluation today patient appears to be doing quite well. There does not appear to be any signs of active infection at this time which is great news. Overall I am extremely pleased with where things stand today. I think he is headed in the right direction. 06/11/2021 upon evaluation today patient appears to be doing well with regard to his left ankle ulcer and poorly in regard to the toe ulcer on the second toe right foot. This appears to show signs of joint exposure. Apparently this has been present for 1 to 2 months although he kept forgetting to tell me  about it. That is unfortunate as right now it definitely appears to be doing significantly worse than what I would like to see. There does not appear to be any signs of active infection systemically though locally I am concerned about the possibility of infection the toe is quite red. Again no one from the facility ever contacted [place] to advise that this was going on in the interim either. 06/17/2021 upon evaluation today patient presents for follow-up I did review his x-ray which showed a navicular bone fracture I am unsure of the chronicity of this. Subsequently he also had osteomyelitis of the toe which was what I was more concerned about this did not show up on x-ray but did show up on the pathology scrapings. This was listed as acute osteomyelitis. Nonetheless at this point I think that the antibiotic treatment is the best regimen to go with currently. The patient is in agreement with that plan. Nonetheless he has initially 30 days of doxycycline off likely extend that towards the end of the treatment cycle that will be around the middle of December for an additional 2 weeks. That all depends on how well he continues to heal. Nonetheless based on what I am seeing in the foot I did want a proceed with an MRI as well which I think will be helpful to identify if there is anything else that needs to be addressed from the standpoint of infection. 06/24/2021 upon evaluation today patient appears to be doing pretty well in regards to his wounds. I think both are actually showing signs of improvement which is good I did review his MRI today which did show signs of osteomyelitis of the middle and proximal phalanx on his right foot of the affected toe. With that being said this is actually showing signs of significant improvement today already with the antibiotic therapy I think the redness is also improved. Overall I think that we just need to give this some time with appropriate wound care we will see how  things go potentially hyperbarics could be considered. 07/02/2021 upon inspection today patient actually appears to be doing well in regard to his left ankle which is getting very close to complete resolution of pleased in that regard. Unfortunately he is continuing to have issues with his second toe right foot and this seems  to still be very painful for him. Recommend he try something different from the standpoint of antibiotics. 07/15/2021 upon evaluation today patient appears to be doing actually pretty well in regard to his foot. This is actually showing signs of significant improvement which is great news. Overall I feel like the patient is improving both in regard to the second toe as well as the ankle on the left. With that being said the biggest issue that I do see currently is that he is needing to have a refill of the doxycycline that we previously treated him with. He also did see podiatry they are not going to recommend any amputation at this point since he seems to be doing quite well. For that reason we just need to keep things under control from an infection standpoint. 08/01/2021 upon evaluation today patient appears to be doing well with regard to his wound. He has been tolerating the dressing changes without complication. Fortunately there does not appear to be any evidence of active infection locally nor systemically at this point. In fact I think everything is doing excellent in fact his second toe on the right foot is almost healed and the ankle on the left ankle region is actually very close to being healed as well. 08/08/2021 upon evaluation today patient appears to be doing well with regard to his wound. He has been tolerating the dressing changes without complication. Fortunately I do not see any signs of active infection at this time. Readmission: 12-06-2021 upon evaluation today patient presents for reevaluation here in the clinic he does tell me that he was being seen in  facility at [place] by a provider that was coming in. He is not sure who this was. He tells me however that the wound seems to have gotten worse even compared to where it was when we last saw him at this point. With that being said I do believe that he is likely going need ongoing wound care here in the clinic and I do believe that we need to be the ones to frontline this since his wound does seem to be getting worse not better at this point. He voiced understanding. He is also in agreement with this plan and feels more comfortable coming here she tells me. Patient's medical history really has not changed since his prior admission he was only gone since January. 12-27-2021 upon evaluation today patient appears to be doing well with regard to his wound they did run out of the [facility] so they did not put anything on just an ABD pad with gentamicin. Still we are seeing some signs of good improvement here with some new epithelization which is great news. 01-10-2022 upon evaluation today patient appears to be doing well with regard to his wounds and he is going require some sharp debridement but overall seems to be making good progress. Fortunately I do not see any evidence of active infection locally or systemically at this time which is great news. 01-24-2022 upon evaluation today patient appears to be doing well with regard to his wound. The facility actually came and dropped him off early and he had another appointment at the hospital and then they just brought him over here and this was still hours before his appointment this afternoon. For that reason we did do our best to work him in this morning and fortunately had some space to make this happen. With that being said patient's wound does seem to be making progress here and I  am very pleased in that regard I do not see any signs of active infection locally or systemically at this time. 02-07-2022 patient appears to be doing well  currently in regard to his wounds. In fact one of them the more proximal is healed the distal is still open but seems to be doing excellent. Fortunately I do not see any evidence of active infection locally or systemically at this time which is great news. No fevers, chills, nausea, vomiting, or diarrhea. 7/27; left medial ankle venous. Improving per our intake nurse. We are using Hydrofera Blue under Tubigrip compression. They are changing that at his facility 03-06-2022 upon evaluation today patient appears to be doing well with regard to the his wound he is can require some sharp debridement but seems to be making excellent progress. Fortunately I do not see any evidence of active infection locally or systemically which is great news. 03-27-2022 upon evaluation today patient's wound is actually showing signs of excellent improvement. Fortunately I see no signs of active infection locally or systemically at this time which is great news. No fevers, chills, nausea, vomiting, or diarrhea. Chad [name], Chad [name] ([id number]) [id number] Page 4 of 15 04-21-2022 upon evaluation today patient appears to be doing excellent in regard to his wound in fact this is very close to resolution based on what I am seeing. I do not see any evidence of active infection locally or systemically at this time which is great news and overall I am extremely pleased with where we are today. 05-05-2022 upon evaluation today patient's wound actually appears to potentially be completely healed. Fortunately I do not see any evidence of active infection at this time which is great news and overall I am very pleased I think this needs a little time to toughen up but other than that I really do believe were doing quite well. He is very pleased to hear this its been a long time coming. 05-19-2022 upon evaluation today patient appears to be doing well currently in regard to his wound. He in fact we were hoping will  be completely healed and closed but it still has a very tiny area right in the center which is still continuing to drain. Fortunately I do not see any signs of infection locally or systemically which is great news. 11/6; this wound is close to completely" he comes in this week with some erythema at 1 edge of this which looks like something was rubbing on the area. Other than that no evidence of infection 06-16-2022 upon evaluation today patient appears to be doing well currently in regard to his ankle ulcer. This is very close to being healed but still has a small area in the central portion which is not. Fortunately there does not appear to be any signs of infection locally or systemically which is great news. No fevers, chills, nausea, vomiting, or diarrhea. 06-30-2022 upon evaluation patient's wound pretty much appears to be almost completely closed. In fact it may even be closed but there are still a lot of skin irritation around the edges of the wound and I just do not feel great about releasing them yet I would like to continue to monitor this just a little bit longer before getting to that point. He is not opposed to this and in fact is happy to continue to come in if need be in order to make sure that things are moving in the right direction he definitely does not want to backtrack. 07-14-2022 upon evaluation today patient  appears to be doing well currently in regard to his wound. Has been tolerating the dressing changes without complication. Fortunately I see no evidence of active infection locally nor systemically which is great news. No fevers, chills, nausea, vomiting, or diarrhea. 08-11-2022 upon evaluation today patient appears to be doing a little worse than last time I saw him although it has been a month. Unfortunately he was not able to be seen due to the fact that he was quarantined in the facility with COVID. Subsequently he tells me that he mention to the facility staff several  times about taking it off over the past several weeks but they continue to tell him that someone have to get in touch with [place] here although nobody ever did until Friday when the nurse manager called to have that documented in the communication notes. With that being said unfortunately this means that he had a wrap on that he was supposed to have on for only 1 week for ended up being on four 1 month essentially. I am glad that there is nothing worse than what we see currently to be perfectly honest. 08-19-2022 upon evaluation today patient appears to be doing well currently in regard to his wounds. He has been tolerating the dressing changes without complication. This is actually smaller today which is great news after last week's reopening. Fortunately I do not see any evidence of infection locally nor systemically at this point. 08-26-2022 upon evaluation today patient appears to be doing well currently in regard to his wound. He has been tolerating the dressing changes without complication. Fortunately there does not appear to be any signs of active infection locally nor systemically which is great news. No fevers, chills, nausea, vomiting, or diarrhea. 09-02-2022 upon evaluation today patient's wound actually showing signs of excellent improvement this is measuring smaller and looking much better. I am very pleased with where we stand I do believe that we are headed in the right direction. 09-09-2022 upon evaluation today patient appears to be doing decently well in regard to his leg in general although there was some swelling this is the 1 thing that I am not too happy with based on what I see currently. Fortunately there does not appear to be any signs of infection locally nor systemically at this point. 09-16-2022 upon evaluation today patient appears to be doing well currently in regard to his wound. He is actually showing signs of improvement he wore the Tubigrip this week and it significantly  better compared to last week's evaluation. Fortunately I do not see any evidence of active infection locally or systemically which is great news. Overall I think that were headed in the right direction which is great news. In fact overall I think this is the best that he has looked in some time. He does not like the Tubigrip but actually did extremely well with this in my opinion. I think he needs to continue to utilize this. 09-23-2022 upon evaluation today patient appears to be doing well currently in regard to his wound. He has been tolerating the dressing changes without complication. Fortunately there does not appear to be any signs of active infection locally nor systemically which is great news and in general I do feel like that we are headed in the right direction. He has been having trouble with the Tubigrip however we have gotten the facility to order compression socks he has not gotten them as of yet. 3/4; patient is about the same with a wound  on the left posterior leg. This is a venous wound. He needs more compression on this leg but he took off the previous wraps we are using. I am not certain whether he is using juxta lite stockings at the nursing home. 3/12; patient presents for follow-up. He unfortunately has 2 new wounds 1 to the dorsal left side and the other to the right heel. He is not sure how these happened. The right heel appears to be caused by pressure. He has been using Hydrofera Blue to the original wound on the left leg. 10-14-2022 upon evaluation today patient appears to be doing well currently in regard to his original wound but unfortunately he has several new wounds which I was completely unaware of before walking into the room to see him today. Unfortunately he seems to not be doing nearly as well as what I saw when I last had an appointment with him 3 weeks ago. Fortunately I do not see any signs of systemic infection though unfortunately locally he has definite  infection of the left lower extremity. He is currently on doxycycline I am going to likely have to extend that today. 10-21-2022 upon evaluation today patient appears to be doing well currently in regard to his wounds. He seems to be tolerating the dressing changes without complication. Fortunately there does not appear to be any signs of active infection locally or systemically which is great news and in general I definitely feel like we are on the right track here. 10-30-2022 upon evaluation today patient still continues to not be doing too well at all with regard to his wounds. Unfortunately he is still having signs of infection as well which also has been concerned. Fortunately I do not see any signs of active infection locally nor systemically at this time. No fevers, chills, nausea, vomiting, or diarrhea. 11-06-2022 upon evaluation today patient appears to be doing okay currently in regard to his wounds. All things considered with his blood flow not being nearly as good as what should I think that he is making pretty good progress here. Fortunately I do not see any signs of infection locally nor systemically which is great news. No fevers, chills, nausea, vomiting, or diarrhea. 11-13-2022 upon evaluation today patient appears to be doing poorly currently in regard to his wounds. Things seem to be getting a little bit worse not better. 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 Subsequently he is also going to need to have vascular intervention but we are unsure exactly when this is going on the undertaking. I would try to actually send a message to Dr. Hazle Quant and the vascular team just to see what exactly the plan is going forward. The patient is in agreement with that plan I just think we need to have a plan especially in light of the fact the left leg seems to be getting a little bit worse at this  point. 11-25-2022 upon evaluation today patient is currently now discharged from the hospital following his colostomy procedure. That did very well and he seems to [facility], Chad [name] ([id number]) [id number] Page 5 of 15 be doing well. Fortunately there does not appear to be any signs of active infection locally or systemically which is great news. No fevers, chills, nausea, vomiting, or diarrhea. 12-04-2022 upon evaluation today patient appears to be doing actually decently well in regard to his wounds and actually very pleased  with where we stand and I think that he is making some pretty good progress here. Fortunately I do not see any signs of active infection locally nor systemically which is great news. No fevers, chills, nausea, vomiting, or diarrhea. 12-12-2022 upon evaluation today patient appears to be doing well currently in regard to his wound. He has been tolerating the dressing changes without complication. The wounds all seem to be making some progress to a degree. He has appointment with vascular on the 23rd of this month. 5/24; the patient was scheduled for a left lower extremity angiogram yesterday but apparently they were overbooked and they wanted to admit him to hospital last night so they could do this early this morning but he refused I see that Dr. Wyn Quaker has rescheduled him for June 6. He is using silver alginate to wounds on his bilateral lower extremities. 01-02-2023 upon evaluation today patient appears to be doing poorly in regard to his wounds still. Subsequently he did have his angiogram but apparently there really was not much that was found that could be revascularized. The patient tells me that Dr. Wyn Quaker was going to contact me here in the office I have not heard from him as of yet and I may see about giving him a call just to follow-up or possibly send a message through epic which could be an option as well. 01-09-2023 upon evaluation today patient  appears to be doing poorly in regard to his legs he is extremely swollen. He has been sleeping in his chair he tells me for the past week. His bed is broken and they have ordered a new one which is supposedly supposed to be there today. With that being said he the way he needs to be not sleeping in his chair that is his wheelchair every day and through the night. This is causing his legs to be much more swollen and it seen and how badly wounds are doing at this point. This has made things significantly worse. Fortunately I do not see any signs of infection right now unfortunately I do believe that the patient has a tremendous amount of drainage currently. Electronic Signature(s) Signed: 01/09/2023 10:16:36 AM By: [name] PA-C Entered By: [name] on 01/09/2023 10:16:36 -------------------------------------------------------------------------------- Physical Exam Details Patient Name: Date of Service: SAHAL, Chad [name] 01/09/2023 9:30 A M Medical Record Number: [id number] Patient Account Number: [id number] Date of Birth/Sex: Treating RN: [birth year] (62 y.o. Chad [name] Primary Care Provider: Cyril [name] Other Clinician: Referring Provider: Treating Provider/Extender: [name] Weeks in Treatment: 38 Constitutional Well-nourished and well-hydrated in no acute distress. Respiratory normal breathing without difficulty. Psychiatric this patient is able to make decisions and demonstrates good insight into disease process. Alert and Oriented x 3. pleasant and cooperative. Notes Upon inspection patient's wound bed actually showed signs of good granulation and epithelization at this point. Fortunately I do not see any evidence of active infection locally nor systemically which is great news and in general I do believe that we are moving in the right direction from an infection standpoint but unfortunately from a swelling standpoint he is completely out of control.  Coupled with the fact that he has been sitting up in his chair to sleep all night for the past week he has really no time these are elevating his legs remain laying flat this has led to a significant issue here. Electronic Signature(s) Signed: 01/09/2023 10:17:09 AM By: [name] PA-C Entered By: [name] on 01/09/2023 10:17:09 Chad [name],  Chad [name] ([id number]) [id number] Page 6 of 15 -------------------------------------------------------------------------------- Physician Orders Details Patient Name: Date of Service: Chad [name], Chad [name] 01/09/2023 9:30 A M Medical Record Number: [id number] Patient Account Number: [id number] Date of Birth/Sex: Treating RN: [birth year] (62 y.o. 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 Nurse Visit as needed Bathing/ Shower/ Hygiene May shower with wound dressing protected with water repellent cover or cast protector. No tub bath. Anesthetic (Use 'Patient Medications' Section for Anesthetic Order Entry) Lidocaine applied to wound bed Edema Control - Lymphedema / Segmental Compressive Device / Other Elevate legs to the level of the heart and pump ankles as often as possible Elevate leg(s) parallel to the floor when sitting. Other: - PATIENT MUST SLEEP IN A BED. Legs are swelling due to no  elevation of legs. Off-Loading Heel suspension boot - when in bed Medications-Please add to medication list. Other: - apply AandD ointment to leg, do not apply to wound Wound Treatment Wound #12 - Ankle Wound Laterality: Left, Medial, Distal Cleanser: Soap and Water 3 x Per Week/30 Days Discharge Instructions: Gently cleanse wound with antibacterial soap, rinse and pat dry prior to dressing wounds Cleanser: Wound Cleanser 3 x Per Week/30 Days Discharge Instructions: Wash your hands with soap and water. Remove old dressing, discard into plastic bag and place into trash. Cleanse the wound with Wound Cleanser prior to applying a clean dressing using gauze sponges, not tissues or cotton balls. Do not scrub or use excessive force. Pat dry using gauze sponges, not tissue or cotton balls. Prim Dressing: Silvercel Small 2x2 (in/in) 3 x Per Week/30 Days ary Discharge Instructions: Apply Silvercel Small 2x2 (in/in) as instructed Secondary Dressing: Zetuvit Plus 4x8 (in/in) 3 x Per Week/30 Days Compression Wrap: UNNA BOOT - Unna-Z Zinc and Calamine Boot, 4x10 (in/yd) 3 x Per Week/30 Days Discharge Instructions: Unna Paste, Kerlix and Coban from base of toes to three finger widths below bend in knee. Wound #15 - Foot Wound Laterality: Dorsal, Left Cleanser: Soap and Water 3 x Per Week/30 Days Discharge Instructions: Gently cleanse wound with antibacterial soap, rinse and pat dry prior to dressing wounds [name] ([id number]) [id number] Page 7 of 15 Cleanser: Wound Cleanser 3 x Per Week/30 Days Discharge Instructions: Wash your hands with soap and water. Remove old dressing, discard into plastic bag and place into trash. Cleanse the wound with Wound Cleanser prior to applying a clean dressing using gauze sponges, not tissues or cotton balls. Do not scrub or use excessive force. Pat dry using gauze sponges, not tissue or cotton balls. Prim Dressing: Silvercel Small  2x2 (in/in) 3 x Per Week/30 Days ary Discharge Instructions: Apply Silvercel Small 2x2 (in/in) as instructed Secondary Dressing: Zetuvit Plus 4x8 (in/in) 3 x Per Week/30 Days Compression Wrap: UNNA BOOT - Unna-Z Zinc and Calamine Boot, 4x10 (in/yd) 3 x Per Week/30 Days Discharge Instructions: Unna Paste, Kerlix and Coban from base of toes to three finger widths below bend in knee. Wound #16 - Calcaneus Wound Laterality: Right Cleanser:  Soap and Water 3 x Per Week/30 Days Discharge Instructions: Gently cleanse wound with antibacterial soap, rinse and pat dry prior to dressing wounds Cleanser: Wound Cleanser 3 x Per Week/30 Days Discharge Instructions: Wash your hands with soap and water. Remove old dressing, discard into plastic bag and place into trash. Cleanse the wound with Wound Cleanser prior to applying a clean dressing using gauze sponges, not tissues or cotton balls. Do not scrub or use excessive force. Pat dry using gauze sponges, not tissue or cotton balls. Prim Dressing: Silvercel Small 2x2 (in/in) 3 x Per Week/30 Days ary Discharge Instructions: Apply Silvercel Small 2x2 (in/in) as instructed Secondary Dressing: Zetuvit Plus 4x8 (in/in) 3 x Per Week/30 Days Compression Wrap: UNNA BOOT - Unna-Z Zinc and Calamine Boot, 4x10 (in/yd) 3 x Per Week/30 Days Discharge Instructions: Unna Paste, Kerlix and Coban from base of toes to three finger widths below bend in knee. Wound #17 - Lower Leg Wound Laterality: Left, Anterior Cleanser: Soap and Water 3 x Per Week/30 Days Discharge Instructions: Gently cleanse wound with antibacterial soap, rinse and pat dry prior to dressing wounds Cleanser: Wound Cleanser 3 x Per Week/30 Days Discharge Instructions: Wash your hands with soap and water. Remove old dressing, discard into plastic bag and place into trash. Cleanse the wound with Wound Cleanser prior to applying a clean dressing using gauze sponges, not tissues or cotton balls. Do not scrub or  use excessive force. Pat dry using gauze sponges, not tissue or cotton balls. Prim Dressing: Silvercel Small 2x2 (in/in) 3 x Per Week/30 Days ary Discharge Instructions: Apply Silvercel Small 2x2 (in/in) as instructed Secondary Dressing: Zetuvit Plus 4x8 (in/in) 3 x Per Week/30 Days Compression Wrap: UNNA BOOT - Unna-Z Zinc and Calamine Boot, 4x10 (in/yd) 3 x Per Week/30 Days Discharge Instructions: Unna Paste, Kerlix and Coban from base of toes to three finger widths below bend in knee. Wound #18 - T Second oe Wound Laterality: Right, Medial Cleanser: Soap and Water Every Other Day/30 Days Discharge Instructions: Gently cleanse wound with antibacterial soap, rinse and pat dry prior to dressing wounds Cleanser: Wound Cleanser Every Other Day/30 Days Discharge Instructions: Wash your hands with soap and water. Remove old dressing, discard into plastic bag and place into trash. Cleanse the wound with Wound Cleanser prior to applying a clean dressing using gauze sponges, not tissues or cotton balls. Do not scrub or use excessive force. Pat dry using gauze sponges, not tissue or cotton balls. Prim Dressing: Silvercel Small 2x2 (in/in) Every Other Day/30 Days ary Discharge Instructions: Apply Silvercel Small 2x2 (in/in) as instructed Secondary Dressing: Coverlet Latex-Free Fabric Adhesive Dressings (DME) (Generic) Every Other Day/30 Days Discharge Instructions: Knuckle Wound #19 - Lower Leg Wound Laterality: Left, Posterior Cleanser: Soap and Water 3 x Per Week/30 Days Discharge Instructions: Gently cleanse wound with antibacterial soap, rinse and pat dry prior to dressing wounds Cleanser: Wound Cleanser 3 x Per Week/30 Days Discharge Instructions: Wash your hands with soap and water. Remove old dressing, discard into plastic bag and place into trash. Cleanse the wound with Wound Cleanser prior to applying a clean dressing using gauze sponges, not tissues or cotton balls. Do not scrub or use  excessive force. Pat dry using gauze sponges, not tissue or cotton balls. Prim Dressing: Silvercel Small 2x2 (in/in) 3 x Per Week/30 Days ary Discharge Instructions: Apply Silvercel Small 2x2 (in/in) as instructed Secondary Dressing: Zetuvit Plus 4x8 (in/in) 3 x Per Week/30 Days Compression Wrap: UNNA BOOT - Unna-Z Zinc and Calamine  Boot, 4x10 (in/yd) 3 x Per Week/30 Days Chad [name], Chad [name] ([id number]) [id number] Page 8 of 15 Discharge Instructions: Melony Overly, Kerlix and Coban from base of toes to three finger widths below bend in knee. Electronic Signature(s) Signed: 01/09/2023 12:16:56 PM By: Elliot Gurney, BSN, RN, CWS, Kim RN, BSN Signed: 01/09/2023 1:44:39 PM By: [name] PA-C Entered By: Elliot Gurney BSN, RN, CWS, Kim on 01/09/2023 10:15:16 -------------------------------------------------------------------------------- Problem List Details Patient Name: Date of Service: Chad [name], Chad [name] 01/09/2023 9:30 A M Medical Record Number: [id number] Patient Account Number: [id number] Date of Birth/Sex: Treating RN: [birth year] (62 y.o. 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 affecting left non- 12/06/2021 No Yes dominant side 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, stage 3 10/07/2022 No Yes Inactive Problems Resolved Problems Electronic Signature(s) Signed: 01/09/2023 9:21:33 AM By: [name] PA-C Entered  By: [name] on 01/09/2023 09:21:33 [name] ([id number]) [id number] Page 9 of 15 -------------------------------------------------------------------------------- Progress Note Details Patient Name: Date of Service: Chad [name], Chad [name] 01/09/2023 9:30 A M Medical Record Number: [id number] Patient Account Number: [id number] Date of Birth/Sex: Treating RN: [birth year] (62 y.o. Chad [name] Primary Care Provider: Cyril [name] Other Clinician: Referring Provider: Treating Provider/Extender: [name] in Treatment: 8 Subjective Chief Complaint Information obtained from Patient Left ankle ulcer, Left dorsal foot wound, right heel History of Present Illness (HPI) 10/08/18 on evaluation today patient actually presents to our office for initial evaluation concerning wounds that he has of the bilateral lower extremities. 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 He also has bilateral venous stasis. He apparently has been homeless currently following discharge from the hospital apparently he has been placed at [facility] which is is a skilled nursing facility locally. Nonetheless fortunately he does not show any signs of infection at this time which is good news. In fact several of the wound actually appears to be showing some signs of improvement already in my pinion. There are a couple areas in the left leg in particular there likely gonna require some sharp debridement to help clear away some necrotic tissue and help with more sufficient healing. No fevers, chills, nausea, or vomiting noted at this time. 10/15/18 on evaluation today patient actually appears to be doing very well in regard to his bilateral lower extremities. He's been tolerating the dressing changes without  complication. Fortunately there does not appear to be any evidence of active infection at this time which is great news. Overall I'm actually very pleased with how this has progressed in just one visits time. Readmission: 08/14/2020 upon evaluation today patient presents for re-evaluation here in our clinic. He is having issues with his left ankle region as well as his right toe and his right heel. He tells me that the toe and heel actually began as a area that was itching that he was scratching and then subsequently opened up into wounds. These may have been abscess  areas I presume based on what I am seeing currently. With regard to his left ankle region he tells me this was a similar type occurrence although he does have venous stasis this very well may be more of a venous leg ulcer more than anything. Nonetheless I do believe that the patient would benefit from appropriate and aggressive wound care to try to help get things under better control here. He does have history of a stroke on the left side affecting him to some degree there that he is able to stand although he does have some residual weakness. Otherwise again the patient does have chronic venous insufficiency as previously noted. His arterial studies most recently obtained showed that he had an ABI on the right of 1.16 with a TBI of 0.52 and on the left and ABI of 1.14 with a TBI of 0.81. That was obtained on 06/19/2020. 08/28/2020 upon evaluation today patient appears to be doing decently well in regard to his wounds in general. He has been tolerating the dressing changes without complication. Fortunately there does not appear to be any signs of active infection which is great news. With that being said I think the [facility] is doing a good job I would recommend that we likely continue with that currently. 09/11/2020 upon evaluation today patient's wounds did not appear to be doing too poorly but again he is not really showing signs of  significant improvement with regard to any of the wounds on the right. None of them have Hydrofera Blue on them I am not exactly sure why this is not being followed as the facility did not contact us to let us know of any issues with obtaining dressings or otherwise. With that being said he is supposed to be using Hydrofera Blue on both of the wounds on the right foot as well as the ankle wound on the left side. 09/18/2020 upon evaluation today patient appears to be doing poorly with regard to his wounds. Again right now the left ankle in particular showed signs of extreme maceration. Apparently he was told by someone with staff at [facility] they could not get the [facility]. With that being said this is something that is never been relayed to [place] one way or another. Also the patient subsequently has not supposed to have a border gauze dressing on. He should have an ABD pad and roll gauze to secure as this drains much too much just to have a border gauze dressing to cover. Nonetheless the fact that they are not using the appropriate dressing is directly causing deterioration of the left ankle wound it is significantly worse today compared to what it was previous. I did attempt to call [facility] while the patient was here I called three times and got no one to even pick up the phone. After this I had my for an office coordinator call and she was able to finally get through and leave a message with the D ON as of dictation of this note which is roughly about an hour and a half later I still have not been able to speak with anyone at the facility. 09/25/2020 upon evaluation today patient actually showing signs of good improvement which is excellent news. He has been tolerating the dressing changes without complication. Fortunately there is no signs of active infection which is great news. No fevers, chills, nausea, vomiting, or diarrhea. I do feel like the facility has been doing a  much better job at taking  care of him as far as the dressings are concerned. However the director of nursing never did call me back. 10/09/2020 upon evaluation today patient appears to be doing well with regard to his wound. The toe ulcer did require some debridement but the other 2 areas actually appear to be doing quite well. 10/19/2020 upon evaluation today patient actually appears to be doing very well in regard to his wounds. In fact the heel does appear to be completely healed. The toe is doing better in the medial ankle on the left is also doing better. Overall I think he is headed in the right direction. 10/26/2020 upon evaluation today patient appears to be doing well with regard to his wound. He is showing signs of improvement which is great news and overall I am very pleased with where things stand today. No fevers, chills, nausea, vomiting, or diarrhea. 11/02/2020 upon evaluation today patient appears to be doing well with regard to his wounds. He has been tolerating the dressing changes without complication overall I am extremely pleased with where things stand today. He in regard to the toe is almost completely healed and the medial ankle on the left is doing much better. Chad [name], Chad [name] ([id number]) [id number] Page 10 of 15 11/09/2020 upon evaluation today patient appears to be doing a little poorly in regard to his left medial ankle ulcer. Fortunately there does not appear to be any signs of systemic infection but unfortunately locally he does appear to be infected in fact he has blue-green drainage consistent with Pseudomonas. 11/16/2020 upon evaluation today patient appears to be doing well with regard to his wound. It actually appears to be doing better. I did place him on gentamicin cream since the Cipro was actually resistant even though he was positive for Pseudomonas on culture. Overall I think that he does seem to be doing better though I am unsure whether  or not they have actually been putting the cream on. The patient is not sure that we did talk to the nurse directly and she was going to initiate that treatment. Fortunately there does not appear to be any signs of active infection at this time. No fevers, chills, nausea, vomiting, or diarrhea. 4/28; the area on the right second toe is close to healed. Left medial ankle required debridement 12/07/2020 upon evaluation today patient appears to be doing well with regard to his wounds. In fact the right second toe appears to be completely healed which is great news. Fortunately there does not appear to be any signs of active infection at this time which is also great news. I think we can probably discontinue the gentamicin on top of everything else. 12/14/2020 upon evaluation today patient appears to be doing well with regard to his wound. He is making good progress and overall very pleased with where things stand today. There is no signs of active infection at this time which is great news. 12/28/2020 upon evaluation today patient appears to be doing well with regard to his wounds. He has been tolerating the dressing changes without complication. Fortunately there is no signs of active infection at this time. No fevers, chills, nausea, vomiting, or diarrhea. 12/28/2020 upon evaluation today patient's wound bed actually showed signs of excellent improvement. He has great epithelization and granulation I do not see any signs of infection overall I am extremely pleased with where things stand at this point. No fevers, chills, nausea, vomiting, or diarrhea. 01/11/2021 upon evaluation today patient appears to be doing well with regard  to his wound on his leg. He has been tolerating the dressing changes without complication. Fortunately there does not appear to be any signs of active infection which is great news. No fevers, chills, nausea, vomiting, or diarrhea. 01/25/2021 upon evaluation today patient appears to be  doing well with regard to his wound. He has been tolerating the dressing changes without complication. Fortunately the collagen seems to be doing a great job which is excellent news. No fevers, chills, nausea, vomiting, or diarrhea. 02/08/2021 upon evaluation today patient's wound is actually looking a little bit worse especially in the periwound compared to previous. Fortunately there does not appear to be any signs of infection which is great news with that being said he does have some irritation around the periphery of the wound which has me more concerned. He actually had a dressing on that had not been changed in 3 days. He also is supposed to have daily dressing changes. With regard to the dressing applied he had a silver alginate dressing and silver collagen is what is recommended and ordered. He also had no Desitin around the edges of the wound in the periwound region although that is on the order inspect to be done as well. In general I was very concerned I did contact [facility] actually spoke with Verlon Au who is the scheduling individual and subsequently she stated that she would pass the information to the D ON apparently the D ON was not available to talk to me when I call today. 02/18/2021 upon evaluation today patient's wound is actually showing signs of improvement. Fortunately there does not appear to be any evidence of infection which is great news overall I am extremely pleased with where things stand today. No fevers, chills, nausea, vomiting, or diarrhea. 8/3; patient presents for 1 week follow-up. He has no issues or complaints today. He denies signs of infection. 03/11/2021 upon evaluation today patient appears to be doing well with regard to his wound. He does have a little bit of slough noted on the surface of the wound but fortunately there does not appear to be any signs of active infection at this time. No fevers, chills, nausea, vomiting, or diarrhea. 03/18/2021 upon  evaluation today patient appears to be doing well with regard to his wound. He has been tolerating the dressing changes without complication. There was a little irritation more proximal to where the wound was that was not noted last week but nonetheless this is very superficial just seems to be more irritation we just need to make sure to put a good amount of the zinc over the area in my opinion. Otherwise he does not seem to be doing significantly worse at all which is great news. 03/25/2021 upon evaluation today patient appears to be doing well with regard to his wound. He is going require some sharp debridement today to clear with some of the necrotic debris. I did perform this today without complication postdebridement wound bed appears to be doing much better this is great news. 04/08/2021 upon evaluation today patient appears to be doing decently well in regard to his wound although the overall measurement is not significantly smaller compared to previous. It is gone down a little bit but still the facility continues to not really put the appropriate dressings in place in fact he was supposed to have collagen we think he probably had more of an allergy to At this point. Fortunately there does not appear to be any signs of active infection systemically though  locally I do not see anything on initial visualization either as far as erythema or warmth. 04/15/2021 upon evaluation today patient appears to be doing well with regard to his wound. He is actually showing signs of improvement. I did place him on antibiotics last week, Cipro. He has been taking that 2 times a day and seems to be tolerating it very well. I do not see any evidence of worsening and in fact the overall appearance of the wound is smaller today which is also great news. 9/26; left medial ankle chronic venous insufficiency wound is improved. Using Hydrofera Blue 10/10; left medial ankle chronic venous insufficiency. Wound has not  changed much in appearance completely nonviable surface. Apparently there have been problems getting the right product on the wound at the facility although he came in with [facility] on today 05/14/2021 upon evaluation today patient appears to be doing well with regard to his wound. I think he is making progress here which is good news. Fortunately there does not appear to be any signs of active infection at this time. No fevers, chills, nausea, vomiting, or diarrhea. 05/20/2021 upon evaluation today patient appears to be doing well with regard to his wound. He is showing signs of good improvement which is great news. There does not appear to be any evidence of active infection which is also excellent news. No fevers, chills, nausea, vomiting, or diarrhea. 05/28/2021 upon evaluation today patient appears to be doing quite well. There does not appear to be any signs of active infection at this time which is great news. Overall I am extremely pleased with where things stand today. I think he is headed in the right direction. 06/11/2021 upon evaluation today patient appears to be doing well with regard to his left ankle ulcer and poorly in regard to the toe ulcer on the second toe right foot. This appears to show signs of joint exposure. Apparently this has been present for 1 to 2 months although he kept forgetting to tell me about it. That is unfortunate as right now it definitely appears to be doing significantly worse than what I would like to see. There does not appear to be any signs of active infection systemically though locally I am concerned about the possibility of infection the toe is quite red. Again no one from the facility ever contacted [place] to advise that this was going on in the interim either. 06/17/2021 upon evaluation today patient presents for follow-up I did review his x-ray which showed a navicular bone fracture I am unsure of the chronicity of this. Subsequently he also had  osteomyelitis of the toe which was what I was more concerned about this did not show up on x-ray but did show up on the pathology scrapings. This was listed as acute osteomyelitis. Nonetheless at this point I think that the antibiotic treatment is the best regimen to go with currently. The patient is in agreement with that plan. Nonetheless he has initially 30 days of doxycycline off likely extend that towards the end of the treatment cycle that will be around the middle of December for an additional 2 weeks. That all depends on how well he continues to heal. Nonetheless based on what I am seeing in the foot I did want a proceed with an MRI as well which I think will be helpful to identify if there is anything else that needs to be addressed from the standpoint of infection. 06/24/2021 upon evaluation today patient appears to be doing  pretty well in regards to his wounds. I think both are actually showing signs of improvement which is good I did review his MRI today which did show signs of osteomyelitis of the middle and proximal phalanx on his right foot of the affected toe. With that being said this is actually showing signs of significant improvement today already with the antibiotic therapy I think the redness is also improved. Overall I Chad [name], Chad [name] ([id number]) [id number] Page 11 of 15 think that we just need to give this some time with appropriate wound care we will see how things go potentially hyperbarics could be considered. 07/02/2021 upon inspection today patient actually appears to be doing well in regard to his left ankle which is getting very close to complete resolution of pleased in that regard. Unfortunately he is continuing to have issues with his second toe right foot and this seems to still be very painful for him. Recommend he try something different from the standpoint of antibiotics. 07/15/2021 upon evaluation today patient appears to be doing  actually pretty well in regard to his foot. This is actually showing signs of significant improvement which is great news. Overall I feel like the patient is improving both in regard to the second toe as well as the ankle on the left. With that being said the biggest issue that I do see currently is that he is needing to have a refill of the doxycycline that we previously treated him with. He also did see podiatry they are not going to recommend any amputation at this point since he seems to be doing quite well. For that reason we just need to keep things under control from an infection standpoint. 08/01/2021 upon evaluation today patient appears to be doing well with regard to his wound. He has been tolerating the dressing changes without complication. Fortunately there does not appear to be any evidence of active infection locally nor systemically at this point. In fact I think everything is doing excellent in fact his second toe on the right foot is almost healed and the ankle on the left ankle region is actually very close to being healed as well. 08/08/2021 upon evaluation today patient appears to be doing well with regard to his wound. He has been tolerating the dressing changes without complication. Fortunately I do not see any signs of active infection at this time. Readmission: 12-06-2021 upon evaluation today patient presents for reevaluation here in the clinic he does tell me that he was being seen in facility at [facility] by a provider that was coming in. He is not sure who this was. He tells me however that the wound seems to have gotten worse even compared to where it was when we last saw him at this point. With that being said I do believe that he is likely going need ongoing wound care here in the clinic and I do believe that we need to be the ones to frontline this since his wound does seem to be getting worse not better at this point. He voiced understanding. He is also  in agreement with this plan and feels more comfortable coming here she tells me. Patient's medical history really has not changed since his prior admission he was only gone since January. 12-27-2021 upon evaluation today patient appears to be doing well with regard to his wound they did run out of the [facility] so they did not put anything on just an ABD pad with gentamicin. Still we are seeing some  signs of good improvement here with some new epithelization which is great news. 01-10-2022 upon evaluation today patient appears to be doing well with regard to his wounds and he is going require some sharp debridement but overall seems to be making good progress. Fortunately I do not see any evidence of active infection locally or systemically at this time which is great news. 01-24-2022 upon evaluation today patient appears to be doing well with regard to his wound. The facility actually came and dropped him off early and he had another appointment at the hospital and then they just brought him over here and this was still hours before his appointment this afternoon. For that reason we did do our best to work him in this morning and fortunately had some space to make this happen. With that being said patient's wound does seem to be making progress here and I am very pleased in that regard I do not see any signs of active infection locally or systemically at this time. 02-07-2022 patient appears to be doing well currently in regard to his wounds. In fact one of them the more proximal is healed the distal is still open but seems to be doing excellent. Fortunately I do not see any evidence of active infection locally or systemically at this time which is great news. No fevers, chills, nausea, vomiting, or diarrhea. 7/27; left medial ankle venous. Improving per our intake nurse. We are using Hydrofera Blue under Tubigrip compression. They are changing that at his facility 03-06-2022 upon evaluation today  patient appears to be doing well with regard to the his wound he is can require some sharp debridement but seems to be making excellent progress. Fortunately I do not see any evidence of active infection locally or systemically which is great news. 03-27-2022 upon evaluation today patient's wound is actually showing signs of excellent improvement. Fortunately I see no signs of active infection locally or systemically at this time which is great news. No fevers, chills, nausea, vomiting, or diarrhea. 04-21-2022 upon evaluation today patient appears to be doing excellent in regard to his wound in fact this is very close to resolution based on what I am seeing. I do not see any evidence of active infection locally or systemically at this time which is great news and overall I am extremely pleased with where we are today. 05-05-2022 upon evaluation today patient's wound actually appears to potentially be completely healed. Fortunately I do not see any evidence of active infection at this time which is great news and overall I am very pleased I think this needs a little time to toughen up but other than that I really do believe were doing quite well. He is very pleased to hear this its been a long time coming. 05-19-2022 upon evaluation today patient appears to be doing well currently in regard to his wound. He in fact we were hoping will be completely healed and closed but it still has a very tiny area right in the center which is still continuing to drain. Fortunately I do not see any signs of infection locally or systemically which is great news. 11/6; this wound is close to completely" he comes in this week with some erythema at 1 edge of this which looks like something was rubbing on the area. Other than that no evidence of infection 06-16-2022 upon evaluation today patient appears to be doing well currently in regard to his ankle ulcer. This is very close to being healed but still has  a small area in  the central portion which is not. Fortunately there does not appear to be any signs of infection locally or systemically which is great news. No fevers, chills, nausea, vomiting, or diarrhea. 06-30-2022 upon evaluation patient's wound pretty much appears to be almost completely closed. In fact it may even be closed but there are still a lot of skin irritation around the edges of the wound and I just do not feel great about releasing them yet I would like to continue to monitor this just a little bit longer before getting to that point. He is not opposed to this and in fact is happy to continue to come in if need be in order to make sure that things are moving in the right direction he definitely does not want to backtrack. 07-14-2022 upon evaluation today patient appears to be doing well currently in regard to his wound. Has been tolerating the dressing changes without complication. Fortunately I see no evidence of active infection locally nor systemically which is great news. No fevers, chills, nausea, vomiting, or diarrhea. 08-11-2022 upon evaluation today patient appears to be doing a little worse than last time I saw him although it has been a month. Unfortunately he was not able to be seen due to the fact that he was quarantined in the facility with COVID. Subsequently he tells me that he mention to the facility staff several times about taking it off over the past several weeks but they continue to tell him that someone have to get in touch with [place] here although nobody ever did until Friday when the nurse manager called to have that documented in the communication notes. With that being said unfortunately this means that he had a wrap on that he was supposed to have on for only 1 week for ended up being on four 1 month essentially. I am glad that there is nothing worse than what we see currently to be perfectly honest. 08-19-2022 upon evaluation today patient appears to be doing well currently in  regard to his wounds. He has been tolerating the dressing changes without complication. This is actually smaller today which is great news after last week's reopening. Fortunately I do not see any evidence of infection locally nor systemically at this point. 08-26-2022 upon evaluation today patient appears to be doing well currently in regard to his wound. He has been tolerating the dressing changes without complication. Fortunately there does not appear to be any signs of active infection locally nor systemically which is great news. No fevers, chills, nausea, Chad [name], Chad [name] ([id number]) [id number] Page 12 of 15 vomiting, or diarrhea. 09-02-2022 upon evaluation today patient's wound actually showing signs of excellent improvement this is measuring smaller and looking much better. I am very pleased with where we stand I do believe that we are headed in the right direction. 09-09-2022 upon evaluation today patient appears to be doing decently well in regard to his leg in general although there was some swelling this is the 1 thing that I am not too happy with based on what I see currently. Fortunately there does not appear to be any signs of infection locally nor systemically at this point. 09-16-2022 upon evaluation today patient appears to be doing well currently in regard to his wound. He is actually showing signs of improvement he wore the Tubigrip this week and it significantly better compared to last week's evaluation. Fortunately I do not see any evidence of active infection locally or systemically which is  great news. Overall I think that were headed in the right direction which is great news. In fact overall I think this is the best that he has looked in some time. He does not like the Tubigrip but actually did extremely well with this in my opinion. I think he needs to continue to utilize this. 09-23-2022 upon evaluation today patient appears to be doing well  currently in regard to his wound. He has been tolerating the dressing changes without complication. Fortunately there does not appear to be any signs of active infection locally nor systemically which is great news and in general I do feel like that we are headed in the right direction. He has been having trouble with the Tubigrip however we have gotten the facility to order compression socks he has not gotten them as of yet. 3/4; patient is about the same with a wound on the left posterior leg. This is a venous wound. He needs more compression on this leg but he took off the previous wraps we are using. I am not certain whether he is using juxta lite stockings at the nursing home. 3/12; patient presents for follow-up. He unfortunately has 2 new wounds 1 to the dorsal left side and the other to the right heel. He is not sure how these happened. The right heel appears to be caused by pressure. He has been using Hydrofera Blue to the original wound on the left leg. 10-14-2022 upon evaluation today patient appears to be doing well currently in regard to his original wound but unfortunately he has several new wounds which I was completely unaware of before walking into the room to see him today. Unfortunately he seems to not be doing nearly as well as what I saw when I last had an appointment with him 3 weeks ago. Fortunately I do not see any signs of systemic infection though unfortunately locally he has definite infection of the left lower extremity. He is currently on doxycycline I am going to likely have to extend that today. 10-21-2022 upon evaluation today patient appears to be doing well currently in regard to his wounds. He seems to be tolerating the dressing changes without complication. Fortunately there does not appear to be any signs of active infection locally or systemically which is great news and in general I definitely feel like we are on the right track here. 10-30-2022 upon evaluation  today patient still continues to not be doing too well at all with regard to his wounds. Unfortunately he is still having signs of infection as well which also has been concerned. Fortunately I do not see any signs of active infection locally nor systemically at this time. No fevers, chills, nausea, vomiting, or diarrhea. 11-06-2022 upon evaluation today patient appears to be doing okay currently in regard to his wounds. All things considered with his blood flow not being nearly as good as what should I think that he is making pretty good progress here. Fortunately I do not see any signs of infection locally nor systemically which is great news. No fevers, chills, nausea, vomiting, or diarrhea. 11-13-2022 upon evaluation today patient appears to be doing poorly currently in regard to his wounds. Things seem to be getting a little bit worse not better. 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 Subsequently he is also going to need to have vascular intervention but we are unsure exactly  when this is going on the undertaking. I would try to actually send a message to Dr. Hazle Quant and the vascular team just to see what exactly the plan is going forward. The patient is in agreement with that plan I just think we need to have a plan especially in light of the fact the left leg seems to be getting a little bit worse at this point. 11-25-2022 upon evaluation today patient is currently now discharged from the hospital following his colostomy procedure. That did very well and he seems to be doing well. Fortunately there does not appear to be any signs of active infection locally or systemically which is great news. No fevers, chills, nausea, vomiting, or diarrhea. 12-04-2022 upon evaluation today patient appears to be doing actually decently well in regard to his wounds and actually very pleased with where we stand and  I think that he is making some pretty good progress here. Fortunately I do not see any signs of active infection locally nor systemically which is great news. No fevers, chills, nausea, vomiting, or diarrhea. 12-12-2022 upon evaluation today patient appears to be doing well currently in regard to his wound. He has been tolerating the dressing changes without complication. The wounds all seem to be making some progress to a degree. He has appointment with vascular on the 23rd of this month. 5/24; the patient was scheduled for a left lower extremity angiogram yesterday but apparently they were overbooked and they wanted to admit him to hospital last night so they could do this early this morning but he refused I see that Dr. Wyn Quaker has rescheduled him for June 6. He is using silver alginate to wounds on his bilateral lower extremities. 01-02-2023 upon evaluation today patient appears to be doing poorly in regard to his wounds still. Subsequently he did have his angiogram but apparently there really was not much that was found that could be revascularized. The patient tells me that Dr. Wyn Quaker was going to contact me here in the office I have not heard from him as of yet and I may see about giving him a call just to follow-up or possibly send a message through epic which could be an option as well. 01-09-2023 upon evaluation today patient appears to be doing poorly in regard to his legs he is extremely swollen. He has been sleeping in his chair he tells me for the past week. His bed is broken and they have ordered a new one which is supposedly supposed to be there today. With that being said he the way he needs to be not sleeping in his chair that is his wheelchair every day and through the night. This is causing his legs to be much more swollen and it seen and how badly wounds are doing at this point. This has made things significantly worse. Fortunately I do not see any signs of infection right now unfortunately I  do believe that the patient has a tremendous amount of drainage currently. Objective Constitutional Well-nourished and well-hydrated in no acute distress. Vitals Time Taken: 9:33 AM, Height: 69 in, Weight: 150 lbs, BMI: 22.1, Temperature: 97.7 F, Pulse: 94 bpm, Respiratory Rate: 16 breaths/min, Blood Pressure: 157/89 mmHg. Chad [name], Chad [name] ([id number]) [id number] Page 13 of 15 Respiratory normal breathing without difficulty. Psychiatric this patient is able to make decisions and demonstrates good insight into disease process. Alert and Oriented x 3. pleasant and cooperative. General Notes: Upon inspection patient's wound bed actually showed signs of good granulation and epithelization at  this point. Fortunately I do not see any evidence of active infection locally nor systemically which is great news and in general I do believe that we are moving in the right direction from an infection standpoint but unfortunately from a swelling standpoint he is completely out of control. Coupled with the fact that he has been sitting up in his chair to sleep all night for the past week he has really no time these are elevating his legs remain laying flat this has led to a significant issue here. Integumentary (Hair, Skin) Wound #12 status is Open. Original cause of wound was Gradually Appeared. The date acquired was: 07/12/2019. The wound has been in treatment 57 weeks. The wound is located on the Left,Distal,Medial Ankle. The wound measures 2.5cm length x 1.4cm width x 0.33cm depth; 2.749cm^2 area and 0.907cm^3 volume. There is a medium amount of serosanguineous drainage noted. Wound #15 status is Open. Original cause of wound was Gradually Appeared. The date acquired was: 10/01/2022. The wound has been in treatment 13 weeks. The wound is located on the Left,Dorsal Foot. The wound measures 4cm length x 2.5cm width x 0.4cm depth; 7.854cm^2 area and 3.142cm^3 volume. There is a  medium amount of serosanguineous drainage noted. Wound #16 status is Open. Original cause of wound was Gradually Appeared. The date acquired was: 09/26/2022. The wound has been in treatment 13 weeks. The wound is located on the Right Calcaneus. The wound measures 2.5cm length x 2cm width x 0.3cm depth; 3.927cm^2 area and 1.178cm^3 volume. There is a medium amount of serosanguineous drainage noted. Wound #17 status is Open. Original cause of wound was Pressure Injury. The date acquired was: 09/30/2022. The wound has been in treatment 12 weeks. The wound is located on the Left,Anterior Lower Leg. The wound measures 3.5cm length x 2.8cm width x 0.3cm depth; 7.697cm^2 area and 2.309cm^3 volume. There is a medium amount of serosanguineous drainage noted. Wound #18 status is Open. Original cause of wound was Pressure Injury. The date acquired was: 09/26/2022. The wound has been in treatment 12 weeks. The wound is located on the Right,Medial T Second. The wound measures 0.7cm length x 0.5cm width x 0.1cm depth; 0.275cm^2 area and 0.027cm^3 volume. oe There is a medium amount of serosanguineous drainage noted. Wound #19 status is Open. Original cause of wound was Gradually Appeared. The date acquired was: 11/10/2022. The wound has been in treatment 8 weeks. The wound is located on the Left,Posterior Lower Leg. The wound measures 1.2cm length x 1.2cm width x 0.7cm depth; 1.131cm^2 area and 0.792cm^3 volume. There is a medium amount of serous drainage noted. 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 Nurse  Visit as needed Bathing/ Shower/ Hygiene: May shower with wound dressing protected with water repellent cover or cast protector. No tub bath. Anesthetic (Use 'Patient Medications' Section for Anesthetic Order Entry): Lidocaine applied to wound bed Edema Control - Lymphedema / Segmental Compressive Device / Other: Elevate legs to the level of the heart and pump ankles as often as possible Elevate leg(s) parallel to the floor when sitting. Other: - PATIENT MUST SLEEP IN A BED. Legs are swelling due to no elevation of legs. Off-Loading: Heel  suspension boot - when in bed Medications-Please add to medication list.: Other: - apply AandD ointment to leg, do not apply to wound WOUND #12: - Ankle Wound Laterality: Left, Medial, Distal Cleanser: Soap and Water 3 x Per Week/30 Days Discharge Instructions: Gently cleanse wound with antibacterial soap, rinse and pat dry prior to dressing wounds Cleanser: Wound Cleanser 3 x Per Week/30 Days Discharge Instructions: Wash your hands with soap and water. Remove old dressing, discard into plastic bag and place into trash. Cleanse the wound with Wound Cleanser prior to applying a clean dressing using gauze sponges, not tissues or cotton balls. Do not scrub or use excessive force. Pat dry using gauze sponges, not tissue or cotton balls. Prim Dressing: Silvercel Small 2x2 (in/in) 3 x Per Week/30 Days ary Discharge Instructions: Apply Silvercel Small 2x2 (in/in) as instructed Secondary Dressing: Zetuvit Plus 4x8 (in/in) 3 x Per Week/30 Days Com pression Wrap: UNNA BOOT - Unna-Z Zinc and Calamine Boot, 4x10 (in/yd) 3 x Per Week/30 Days Discharge Instructions: Unna Paste, Kerlix and Coban from base of toes to three finger widths below bend in knee. WOUND #15: - Foot Wound Laterality: Dorsal, Left Cleanser: Soap and Water 3 x Per Week/30 Days Discharge Instructions: Gently cleanse wound with antibacterial soap, rinse and pat dry prior to dressing  wounds [name] ([id number]) [id number] Page 14 of 15 Cleanser: Wound Cleanser 3 x Per Week/30 Days Discharge Instructions: Wash your hands with soap and water. Remove old dressing, discard into plastic bag and place into trash. Cleanse the wound with Wound Cleanser prior to applying a clean dressing using gauze sponges, not tissues or cotton balls. Do not scrub or use excessive force. Pat dry using gauze sponges, not tissue or cotton balls. Prim Dressing: Silvercel Small 2x2 (in/in) 3 x Per Week/30 Days ary Discharge Instructions: Apply Silvercel Small 2x2 (in/in) as instructed Secondary Dressing: Zetuvit Plus 4x8 (in/in) 3 x Per Week/30 Days Com pression Wrap: UNNA BOOT - Unna-Z Zinc and Calamine Boot, 4x10 (in/yd) 3 x Per Week/30 Days Discharge Instructions: Unna Paste, Kerlix and Coban from base of toes to three finger widths below bend in knee. WOUND #16: - Calcaneus Wound Laterality: Right Cleanser: Soap and Water 3 x Per Week/30 Days Discharge Instructions: Gently cleanse wound with antibacterial soap, rinse and pat dry prior to dressing wounds Cleanser: Wound Cleanser 3 x Per Week/30 Days Discharge Instructions: Wash your hands with soap and water. Remove old dressing, discard into plastic bag and place into trash. Cleanse the wound with Wound Cleanser prior to applying a clean dressing using gauze sponges, not tissues or cotton balls. Do not scrub or use excessive force. Pat dry using gauze sponges, not tissue or cotton balls. Prim Dressing: Silvercel Small 2x2 (in/in) 3 x Per Week/30 Days ary Discharge Instructions: Apply Silvercel Small 2x2 (in/in) as instructed Secondary Dressing: Zetuvit Plus 4x8 (in/in) 3 x Per Week/30 Days Com pression Wrap: UNNA BOOT - Unna-Z Zinc and Calamine Boot, 4x10 (in/yd) 3 x Per Week/30 Days Discharge Instructions: Unna Paste, Kerlix and Coban from base of toes to three finger widths below bend in knee. WOUND #17: -  Lower Leg Wound Laterality: Left, Anterior Cleanser: Soap and Water 3 x Per Week/30 Days Discharge Instructions: Gently cleanse wound with antibacterial soap, rinse and pat dry prior to dressing wounds Cleanser: Wound Cleanser 3 x Per Week/30 Days Discharge Instructions: Wash your hands with soap and water. Remove old dressing, discard into plastic bag and place into trash. Cleanse the wound with  Wound Cleanser prior to applying a clean dressing using gauze sponges, not tissues or cotton balls. Do not scrub or use excessive force. Pat dry using gauze sponges, not tissue or cotton balls. Prim Dressing: Silvercel Small 2x2 (in/in) 3 x Per Week/30 Days ary Discharge Instructions: Apply Silvercel Small 2x2 (in/in) as instructed Secondary Dressing: Zetuvit Plus 4x8 (in/in) 3 x Per Week/30 Days Com pression Wrap: UNNA BOOT - Unna-Z Zinc and Calamine Boot, 4x10 (in/yd) 3 x Per Week/30 Days Discharge Instructions: Unna Paste, Kerlix and Coban from base of toes to three finger widths below bend in knee. WOUND #18: - T Second Wound Laterality: Right, Medial oe Cleanser: Soap and Water Every Other Day/30 Days Discharge Instructions: Gently cleanse wound with antibacterial soap, rinse and pat dry prior to dressing wounds Cleanser: Wound Cleanser Every Other Day/30 Days Discharge Instructions: Wash your hands with soap and water. Remove old dressing, discard into plastic bag and place into trash. Cleanse the wound with Wound Cleanser prior to applying a clean dressing using gauze sponges, not tissues or cotton balls. Do not scrub or use excessive force. Pat dry using gauze sponges, not tissue or cotton balls. Prim Dressing: Silvercel Small 2x2 (in/in) Every Other Day/30 Days ary Discharge Instructions: Apply Silvercel Small 2x2 (in/in) as instructed Secondary Dressing: Coverlet Latex-Free Fabric Adhesive Dressings (DME) (Generic) Every Other Day/30 Days Discharge Instructions: Knuckle WOUND #19: - Lower  Leg Wound Laterality: Left, Posterior Cleanser: Soap and Water 3 x Per Week/30 Days Discharge Instructions: Gently cleanse wound with antibacterial soap, rinse and pat dry prior to dressing wounds Cleanser: Wound Cleanser 3 x Per Week/30 Days Discharge Instructions: Wash your hands with soap and water. Remove old dressing, discard into plastic bag and place into trash. Cleanse the wound with Wound Cleanser prior to applying a clean dressing using gauze sponges, not tissues or cotton balls. Do not scrub or use excessive force. Pat dry using gauze sponges, not tissue or cotton balls. Prim Dressing: Silvercel Small 2x2 (in/in) 3 x Per Week/30 Days ary Discharge Instructions: Apply Silvercel Small 2x2 (in/in) as instructed Secondary Dressing: Zetuvit Plus 4x8 (in/in) 3 x Per Week/30 Days Com pression Wrap: UNNA BOOT - Unna-Z Zinc and Calamine Boot, 4x10 (in/yd) 3 x Per Week/30 Days Discharge Instructions: Unna Paste, Kerlix and Coban from base of toes to three finger widths below bend in knee. 1. Based on what I am seeing I do believe that the patient should continue to sleep in his bed he needs to get back in there immediately hopefully the new bed will be arriving today so that he can get there. 2. I would recommend we continue with silver cell to the wounds. 3. Regular actually initiated Unna boot wrap to be changed 3 times per week. We will do this here today and then have it changed on Monday and Wednesday at the facility. 4. I am also can recommend the patient should continue to monitor for any signs of infection or worsening in general. I am very pleased with where we stand from an infection standpoint but nonetheless as much as he is draining and his balance this is that can change significantly and quickly. The good news is he had good blood flow with Dr. Morrie Sheldon check this. We will see patient back for reevaluation in 1 week here in the clinic. If anything worsens or changes patient will  contact our office for additional recommendations. Electronic Signature(s) Signed: 01/09/2023 10:18:00 AM By: [name] PA-C Entered By: [name] on  01/09/2023 10:18:00 Chad [name], MCCAMISH ([id number]) [id number] Page 15 of 15 -------------------------------------------------------------------------------- SuperBill Details Patient Name: Date of Service: FAROOQ, MARCELENO 01/09/2023 Medical Record Number: [id number] Patient Account Number: [id number] Date of Birth/Sex: Treating RN: 08/13/1960 (62 y.o. Chad [name] Primary Care Provider: Cyril [name] Other Clinician: Referring Provider: Treating Provider/Extender: [name] Weeks in Treatment: 57 Diagnosis Coding ICD-10 Codes Code Description [phone] Chronic venous hypertension (idiopathic) with ulcer and inflammation of left lower extremity 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 side 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, stage 3 Facility Procedures : CPT4: Code [id number] Description: 81 BILATERAL: Application of multi-layer venous compression system; leg (below knee), including ankle and t. Modifier: Quantity: 1 Physician Procedures : CPT4 Code Description Modifier [id number] - WC PHYS LEVEL 4 - EST PT ICD-10 Diagnosis Description 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 (idiopathic) with ulcer and inflammation of left lower extremity L97.322 Non-pressure chronic ulcer of left ankle  with fat layer exposed L98.492 Non-pressure chronic ulcer of skin of other sites with fat layer exposed 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 side Quantity: 1 Electronic Signature(s) Signed: 01/09/2023 11:55:20 AM By: Elliot Gurney, BSN, RN, CWS, Kim  RN, BSN Signed: 01/09/2023 1:44:39 PM By: [name] PA-C Previous Signature: 01/09/2023 10:20:20 AM Version By: [name] PA-C Entered By: Elliot Gurney, BSN, RN, CWS, Kim on 01/09/2023 11:55:20

## 2023-01-15 ENCOUNTER — Encounter: Payer: Self-pay | Admitting: Nurse Practitioner

## 2023-01-15 ENCOUNTER — Non-Acute Institutional Stay: Payer: Medicaid Other | Admitting: Nurse Practitioner

## 2023-01-15 VITALS — BP 118/70 | HR 82 | Temp 97.1°F | Resp 18 | Wt 154.0 lb

## 2023-01-15 DIAGNOSIS — G894 Chronic pain syndrome: Secondary | ICD-10-CM

## 2023-01-15 DIAGNOSIS — R630 Anorexia: Secondary | ICD-10-CM

## 2023-01-15 DIAGNOSIS — Z720 Tobacco use: Secondary | ICD-10-CM

## 2023-01-15 DIAGNOSIS — C661 Malignant neoplasm of right ureter: Secondary | ICD-10-CM

## 2023-01-15 DIAGNOSIS — Z515 Encounter for palliative care: Secondary | ICD-10-CM

## 2023-01-15 NOTE — Progress Notes (Addendum)
Therapist, nutritional Palliative Care Consult Note Telephone: [phone]  Fax: [phone]    Date of encounter: 01/15/23 5:29 PM PATIENT NAME: Danell Sisk [address] [place] [address]   [phone] (home)  DOB: [birth year] MRN: [id number] PRIMARY CARE PROVIDER:    [facility]  RESPONSIBLE PARTY:    Contact Information     Name Relation Home Work Mobile   Manual, Broas   [phone]        I met face to face with patient in facility. Palliative Care was asked to follow this patient by consultation request of  [facility] to address advance care planning and complex medical decision making. This is a follow up visit.                                  ASSESSMENT AND PLAN / RECOMMENDATIONS: Symptom Management/Plan: 1. 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 Discussed nutrition.  09/12/2022 weight 169 lbs 10/09/2022 weight 162.8 lbs 11/05/2022 weight 160 lbs 01/07/2023 weight 154 lbs  3. Chronic pain reviewed; reviewed chronic pain, medications reviewed; Mr. Giovino endorses manageable with current oxycodone, discussed and educated continue to monitor on pain scale, monitor efficacy vs adverse side effects.Will continue to monitor; encouraged to ask when in pain, Continue to follow by psychotherapy and psychiatry for counseling; supportive services.    4. PVD; recent ABI's ordered by facility resulted and Mr Gaede wishes are to continue to pursue aggressive interventions and treatment options. Recommended facility to f/u with Vascular for options. Continue with current care to BLE/wounds   5. Palliative care encounter; Palliative medicine team will continue to support patient, patient's family, and medical team. Visit consisted of counseling and education  dealing with the complex and emotionally intense issues of symptom management and palliative care in the setting of serious and potentially life-threatening illness   5. Smoking cessation; reviewed and declined.           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; # Colon ca- stage III   GOALS: palliative   CURRENT/MOST RECENT THERAPY: Keytruda [C]      Urothelial cancer (HCC)      Initial Diagnosis      Urothelial cancer (HCC)      Ureteral cancer, right (HCC)    05/26/2018 Initial Diagnosis      Ureteral cancer, right (HCC)      06/15/2018 -  Chemotherapy      Patient is on Treatment Plan : BLADDER Pembrolizumab (200) q21d       06/21/2018 - 03/17/2022 Chemotherapy      Patient is on Treatment Plan : urothelial cancer- pembrolizumab q21d         Follow up Palliative Care Visit:  Palliative care will continue to follow for complex medical decision making, advance care planning, and clarification of goals. Return 2 to 8 weeks or prn. I spent 47 minutes providing this consultation. More than 50% of the time in this consultation was spent in counseling and care coordination. PPS: 50% Chief Complaint: Follow up palliative consult for complex medical decision making HISTORY OF PRESENT ILLNESS:  Kierin Tempest is a 62 y.o. year old male  with multiple medical problems including   late onset CVA, urothelial  cancer, prostate cancer, chronic hepatitis C, hyperlipidemia, colon surgery, protein calorie malnutrition, anxiety, depression. Mr Guerette resides at [facility], requires assistance for mobility, adl's. Mr Marsolek does feed himself with declined appetite.  He did have an ED visit 11/04/2022 for hematuria; Office visit 11/05/2022 with vascular sgy for severe atherosclerotic changes of both lower extremities associated with ulceration and tissue loss of the bilateral feet with the plan for angiography BLE with hope for intervention for limb salvage. Hospitalized from 11/17/2022 to 11/20/2022 for Robotic assisted laparoscopic partial colectomy with end colostomy creation, stabilized and d/c back to [facility]. Staff endorses no new changes or concerns. At present Mr Bowmer is lying sitting in the w/c in his room with his head lying on a pillow on the bed, sleeping. Mr Alfaro awoke to verbal cues, endorses he is tired. Mr Adamian agree to pc visit. We talked about ros, recent visit with Dr [facility] about his BLE. 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 We talked about appetite, nutrition education done, pain, denies sob. We talked about sleep patterns, sleep hygiene. We talked about smoking cessation which he declined. We talked about residing at facility, daily routine, quality of life. We talked about activities and encouraged social activities. Most PC supportive. Medications, poc, goc reviewed. PC f/u visit further discussion monitor trends of appetite, weights, monitor for functional, cognitive decline with chronic disease progression, assess any active symptoms, supportive role.   History obtained from review of EMR, discussion with staff and Mr. Heaster.  I reviewed available labs, medications, imaging, studies and related documents from the EMR.  Records reviewed and summarized above.  Exam General:  pleasant male, debilitated ENMT: oral mucous membranes moist CV: S1S2, RRR Pulmonary: breath sounds clear MSK: w/d dependent, +Edema BLE Neuro:  left hemiplegia,  no cognitive impairment Psych: non-anxious affect, A and O x 3   Thank you for the opportunity to participate in the care of Mr. Padley. Please call our office at [phone] if we can be of additional assistance.   Valoree Agent Prince [place], NP

## 2023-01-19 ENCOUNTER — Encounter: Payer: Medicaid Other | Admitting: Physician Assistant

## 2023-01-19 DIAGNOSIS — I87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 hypertension (idiopathic) with ulcer and inflammation of left lower extremity: Secondary | ICD-10-CM | POA: Diagnosis not present

## 2023-01-19 NOTE — Progress Notes (Addendum)
Chad, [name] ([id number]) [id number] Page 1 of 15 Visit Report for 01/19/2023 Chief Complaint Document Details Patient Name: Date of Service: [name], [name] 01/19/2023 1:15 PM Medical Record Number: [id number] Patient Account Number: [id number] Date of Birth/Sex: Treating RN: 1961/01/25 (62 y.o. Chad [name] Primary Care Provider: [name] Other Clinician: Referring Provider: Treating Provider/Extender: [name] Weeks in Treatment: 56 Information Obtained from: Patient Chief Complaint Left ankle ulcer, Left dorsal foot wound, right heel Electronic Signature(s) Signed: 01/19/2023 1:24:03 PM By: [name] PA-C Entered By: [name] on 01/19/2023 13:24:03 -------------------------------------------------------------------------------- HPI Details Patient Name: Date of Service: [name], Chad [name] 01/19/2023 1:15 PM Medical Record Number: [id number] Patient Account Number: [id number] Date of Birth/Sex: Treating RN: [birth year] (62 y.o. Chad [name] Primary Care Provider: [name] Other Clinician: Referring Provider: Treating Provider/Extender: [name] Weeks in Treatment: 44 History of Present Illness HPI Description: 10/08/18 on evaluation today patient actually presents to our office for initial evaluation concerning wounds that he has of the bilateral lower extremities. 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 He also has bilateral venous stasis. He apparently has been homeless currently following discharge from the hospital apparently he has been placed at [facility] which is is a skilled nursing facility locally. Nonetheless fortunately he does not show any signs of infection at this time which is good news. In fact several of the wound  actually appears to be showing some signs of improvement already in my pinion. There are a couple areas in the left leg in particular there likely gonna require some sharp debridement to help clear away some necrotic tissue and help with more sufficient healing. No fevers, chills, nausea, or vomiting noted at this time. 10/15/18 on evaluation today patient actually appears to be doing very well in regard to his bilateral lower extremities. He's been tolerating the dressing changes without complication. Fortunately there does not appear to be any evidence of active infection at this time which is great news. Overall I'm actually very pleased with how this has progressed in just one visits time. Readmission: 08/14/2020 upon evaluation today patient presents for re-evaluation here in our clinic. He is having issues with his left ankle region as well as his right toe and his right heel. He tells me that the toe and heel actually began as a area that was itching that he was scratching and then subsequently opened up into wounds. These may have been abscess areas I presume based on what I am seeing currently. With regard to his left ankle region he tells me this was a similar type ARNAV, CREGG ([id number]) [id number] Page 2 of 15 occurrence although he does have venous stasis this very well may be more of a venous leg ulcer more than anything. Nonetheless I do believe that the patient would benefit from appropriate and aggressive wound care to try to help get things under better control here. He does have history of a stroke on the left side affecting him to some degree there that he is able to stand although he does have some residual weakness. Otherwise again the patient does have chronic venous insufficiency as previously noted. His arterial studies most recently obtained showed that he had an ABI on the right of 1.16 with a TBI of 0.52 and on the left and ABI of 1.14 with a  TBI of 0.81. That was obtained on 06/19/2020. 08/28/2020 upon evaluation today patient appears to be doing decently well in regard to his wounds in general. He has been tolerating the dressing changes without complication. Fortunately there does not appear to be any signs of active infection which is great news. With that being said I think the [facility] is doing a good job I would recommend that we likely continue with that currently. 09/11/2020 upon evaluation today patient's wounds did not appear to be doing too poorly but again he is not really showing signs of significant improvement with regard to any of the wounds on the right. None of them have Hydrofera Blue on them I am not exactly sure why this is not being followed as the facility did not contact us to let us know of any issues with obtaining dressings or otherwise. With that being said he is supposed to be using Hydrofera Blue on both of the wounds on the right foot as well as the ankle wound on the left side. 09/18/2020 upon evaluation today patient appears to be doing poorly with regard to his wounds. Again right now the left ankle in particular showed signs of extreme maceration. Apparently he was told by someone with staff at [facility] they could not get the [facility]. With that being said this is something that is never been relayed to [place] one way or another. Also the patient subsequently has not supposed to have a border gauze dressing on. He should have an ABD pad and roll gauze to secure as this drains much too much just to have a border gauze dressing to cover. Nonetheless the fact that they are not using the appropriate dressing is directly causing deterioration of the left ankle wound it is significantly worse today compared to what it was previous. I did attempt to call [facility] while the patient was here I called three times and got no one to even pick up the phone. After this I had my for an  office coordinator call and she was able to finally get through and leave a message with the D ON as of dictation of this note which is roughly about an hour and a half later I still have not been able to speak with anyone at the facility. 09/25/2020 upon evaluation today patient actually showing signs of good improvement which is excellent news. He has been tolerating the dressing changes without complication. Fortunately there is no signs of active infection which is great news. No fevers, chills, nausea, vomiting, or diarrhea. I do feel like the facility has been doing a much better job at taking care of him as far as the dressings are concerned. However the director of nursing never did call me back. 10/09/2020 upon evaluation today patient appears to be doing well with regard to his wound. The toe ulcer did require some debridement but the other 2 areas actually appear to be doing quite well. 10/19/2020 upon evaluation today patient actually appears to be doing very well in regard to his wounds. In fact the heel does appear to be completely healed. The toe is doing better in the medial ankle on the left is also doing better. Overall I think he is headed in the right direction. 10/26/2020 upon evaluation today patient appears to be doing well with regard to his wound. He is showing signs of improvement which is great news and overall I am very pleased with where things stand today. No fevers, chills,  nausea, vomiting, or diarrhea. 11/02/2020 upon evaluation today patient appears to be doing well with regard to his wounds. He has been tolerating the dressing changes without complication overall I am extremely pleased with where things stand today. He in regard to the toe is almost completely healed and the medial ankle on the left is doing much better. 11/09/2020 upon evaluation today patient appears to be doing a little poorly in regard to his left medial ankle ulcer. Fortunately there does not appear to  be any signs of systemic infection but unfortunately locally he does appear to be infected in fact he has blue-green drainage consistent with Pseudomonas. 11/16/2020 upon evaluation today patient appears to be doing well with regard to his wound. It actually appears to be doing better. I did place him on gentamicin cream since the Cipro was actually resistant even though he was positive for Pseudomonas on culture. Overall I think that he does seem to be doing better though I am unsure whether or not they have actually been putting the cream on. The patient is not sure that we did talk to the nurse directly and she was going to initiate that treatment. Fortunately there does not appear to be any signs of active infection at this time. No fevers, chills, nausea, vomiting, or diarrhea. 4/28; the area on the right second toe is close to healed. Left medial ankle required debridement 12/07/2020 upon evaluation today patient appears to be doing well with regard to his wounds. In fact the right second toe appears to be completely healed which is great news. Fortunately there does not appear to be any signs of active infection at this time which is also great news. I think we can probably discontinue the gentamicin on top of everything else. 12/14/2020 upon evaluation today patient appears to be doing well with regard to his wound. He is making good progress and overall very pleased with where things stand today. There is no signs of active infection at this time which is great news. 12/28/2020 upon evaluation today patient appears to be doing well with regard to his wounds. He has been tolerating the dressing changes without complication. Fortunately there is no signs of active infection at this time. No fevers, chills, nausea, vomiting, or diarrhea. 12/28/2020 upon evaluation today patient's wound bed actually showed signs of excellent improvement. He has great epithelization and granulation I do not see any  signs of infection overall I am extremely pleased with where things stand at this point. No fevers, chills, nausea, vomiting, or diarrhea. 01/11/2021 upon evaluation today patient appears to be doing well with regard to his wound on his leg. He has been tolerating the dressing changes without complication. Fortunately there does not appear to be any signs of active infection which is great news. No fevers, chills, nausea, vomiting, or diarrhea. 01/25/2021 upon evaluation today patient appears to be doing well with regard to his wound. He has been tolerating the dressing changes without complication. Fortunately the collagen seems to be doing a great job which is excellent news. No fevers, chills, nausea, vomiting, or diarrhea. 02/08/2021 upon evaluation today patient's wound is actually looking a little bit worse especially in the periwound compared to previous. Fortunately there does not appear to be any signs of infection which is great news with that being said he does have some irritation around the periphery of the wound which has me more concerned. He actually had a dressing on that had not been changed in 3 days. He  also is supposed to have daily dressing changes. With regard to the dressing applied he had a silver alginate dressing and silver collagen is what is recommended and ordered. He also had no Desitin around the edges of the wound in the periwound region although that is on the order inspect to be done as well. In general I was very concerned I did contact [facility] actually spoke with Verlon Au who is the scheduling individual and subsequently she stated that she would pass the information to the D ON apparently the D ON was not available to talk to me when I call today. 02/18/2021 upon evaluation today patient's wound is actually showing signs of improvement. Fortunately there does not appear to be any evidence of infection which is great news overall I am extremely pleased with  where things stand today. No fevers, chills, nausea, vomiting, or diarrhea. 8/3; patient presents for 1 week follow-up. He has no issues or complaints today. He denies signs of infection. 03/11/2021 upon evaluation today patient appears to be doing well with regard to his wound. He does have a little bit of slough noted on the surface of the wound but fortunately there does not appear to be any signs of active infection at this time. No fevers, chills, nausea, vomiting, or diarrhea. 03/18/2021 upon evaluation today patient appears to be doing well with regard to his wound. He has been tolerating the dressing changes without complication. There was a little irritation more proximal to where the wound was that was not noted last week but nonetheless this is very superficial just seems to be more irritation we just need to make sure to put a good amount of the zinc over the area in my opinion. Otherwise he does not seem to be doing significantly worse at all which is great news. 03/25/2021 upon evaluation today patient appears to be doing well with regard to his wound. He is going require some sharp debridement today to clear with MARGIE, BRINK ([id number]) [id number] Page 3 of 15 some of the necrotic debris. I did perform this today without complication postdebridement wound bed appears to be doing much better this is great news. 04/08/2021 upon evaluation today patient appears to be doing decently well in regard to his wound although the overall measurement is not significantly smaller compared to previous. It is gone down a little bit but still the facility continues to not really put the appropriate dressings in place in fact he was supposed to have collagen we think he probably had more of an allergy to At this point. Fortunately there does not appear to be any signs of active infection systemically though locally I do not see anything on initial visualization either as  far as erythema or warmth. 04/15/2021 upon evaluation today patient appears to be doing well with regard to his wound. He is actually showing signs of improvement. I did place him on antibiotics last week, Cipro. He has been taking that 2 times a day and seems to be tolerating it very well. I do not see any evidence of worsening and in fact the overall appearance of the wound is smaller today which is also great news. 9/26; left medial ankle chronic venous insufficiency wound is improved. Using Hydrofera Blue 10/10; left medial ankle chronic venous insufficiency. Wound has not changed much in appearance completely nonviable surface. Apparently there have been problems getting the right product on the wound at the facility although he came in with [facility] on today 05/14/2021 upon  evaluation today patient appears to be doing well with regard to his wound. I think he is making progress here which is good news. Fortunately there does not appear to be any signs of active infection at this time. No fevers, chills, nausea, vomiting, or diarrhea. 05/20/2021 upon evaluation today patient appears to be doing well with regard to his wound. He is showing signs of good improvement which is great news. There does not appear to be any evidence of active infection which is also excellent news. No fevers, chills, nausea, vomiting, or diarrhea. 05/28/2021 upon evaluation today patient appears to be doing quite well. There does not appear to be any signs of active infection at this time which is great news. Overall I am extremely pleased with where things stand today. I think he is headed in the right direction. 06/11/2021 upon evaluation today patient appears to be doing well with regard to his left ankle ulcer and poorly in regard to the toe ulcer on the second toe right foot. This appears to show signs of joint exposure. Apparently this has been present for 1 to 2 months although he kept forgetting to tell me  about it. That is unfortunate as right now it definitely appears to be doing significantly worse than what I would like to see. There does not appear to be any signs of active infection systemically though locally I am concerned about the possibility of infection the toe is quite red. Again no one from the facility ever contacted [place] to advise that this was going on in the interim either. 06/17/2021 upon evaluation today patient presents for follow-up I did review his x-ray which showed a navicular bone fracture I am unsure of the chronicity of this. Subsequently he also had osteomyelitis of the toe which was what I was more concerned about this did not show up on x-ray but did show up on the pathology scrapings. This was listed as acute osteomyelitis. Nonetheless at this point I think that the antibiotic treatment is the best regimen to go with currently. The patient is in agreement with that plan. Nonetheless he has initially 30 days of doxycycline off likely extend that towards the end of the treatment cycle that will be around the middle of December for an additional 2 weeks. That all depends on how well he continues to heal. Nonetheless based on what I am seeing in the foot I did want a proceed with an MRI as well which I think will be helpful to identify if there is anything else that needs to be addressed from the standpoint of infection. 06/24/2021 upon evaluation today patient appears to be doing pretty well in regards to his wounds. I think both are actually showing signs of improvement which is good I did review his MRI today which did show signs of osteomyelitis of the middle and proximal phalanx on his right foot of the affected toe. With that being said this is actually showing signs of significant improvement today already with the antibiotic therapy I think the redness is also improved. Overall I think that we just need to give this some time with appropriate wound care we will see how  things go potentially hyperbarics could be considered. 07/02/2021 upon inspection today patient actually appears to be doing well in regard to his left ankle which is getting very close to complete resolution of pleased in that regard. Unfortunately he is continuing to have issues with his second toe right foot and this seems to still  be very painful for him. Recommend he try something different from the standpoint of antibiotics. 07/15/2021 upon evaluation today patient appears to be doing actually pretty well in regard to his foot. This is actually showing signs of significant improvement which is great news. Overall I feel like the patient is improving both in regard to the second toe as well as the ankle on the left. With that being said the biggest issue that I do see currently is that he is needing to have a refill of the doxycycline that we previously treated him with. He also did see podiatry they are not going to recommend any amputation at this point since he seems to be doing quite well. For that reason we just need to keep things under control from an infection standpoint. 08/01/2021 upon evaluation today patient appears to be doing well with regard to his wound. He has been tolerating the dressing changes without complication. Fortunately there does not appear to be any evidence of active infection locally nor systemically at this point. In fact I think everything is doing excellent in fact his second toe on the right foot is almost healed and the ankle on the left ankle region is actually very close to being healed as well. 08/08/2021 upon evaluation today patient appears to be doing well with regard to his wound. He has been tolerating the dressing changes without complication. Fortunately I do not see any signs of active infection at this time. Readmission: 12-06-2021 upon evaluation today patient presents for reevaluation here in the clinic he does tell me that he was being seen in  facility at [facility] by a provider that was coming in. He is not sure who this was. He tells me however that the wound seems to have gotten worse even compared to where it was when we last saw him at this point. With that being said I do believe that he is likely going need ongoing wound care here in the clinic and I do believe that we need to be the ones to frontline this since his wound does seem to be getting worse not better at this point. He voiced understanding. He is also in agreement with this plan and feels more comfortable coming here she tells me. Patient's medical history really has not changed since his prior admission he was only gone since January. 12-27-2021 upon evaluation today patient appears to be doing well with regard to his wound they did run out of the [facility] so they did not put anything on just an ABD pad with gentamicin. Still we are seeing some signs of good improvement here with some new epithelization which is great news. 01-10-2022 upon evaluation today patient appears to be doing well with regard to his wounds and he is going require some sharp debridement but overall seems to be making good progress. Fortunately I do not see any evidence of active infection locally or systemically at this time which is great news. 01-24-2022 upon evaluation today patient appears to be doing well with regard to his wound. The facility actually came and dropped him off early and he had another appointment at the hospital and then they just brought him over here and this was still hours before his appointment this afternoon. For that reason we did do our best to work him in this morning and fortunately had some space to make this happen. With that being said patient's wound does seem to be making progress here and I am very  pleased in that regard I do not see any signs of active infection locally or systemically at this time. 02-07-2022 patient appears to be doing well  currently in regard to his wounds. In fact one of them the more proximal is healed the distal is still open but seems to be doing excellent. Fortunately I do not see any evidence of active infection locally or systemically at this time which is great news. No fevers, chills, nausea, vomiting, or diarrhea. 7/27; left medial ankle venous. Improving per our intake nurse. We are using Hydrofera Blue under Tubigrip compression. They are changing that at his facility 03-06-2022 upon evaluation today patient appears to be doing well with regard to the his wound he is can require some sharp debridement but seems to be making excellent progress. Fortunately I do not see any evidence of active infection locally or systemically which is great news. 03-27-2022 upon evaluation today patient's wound is actually showing signs of excellent improvement. Fortunately I see no signs of active infection locally or systemically at this time which is great news. No fevers, chills, nausea, vomiting, or diarrhea. FUQUAN, WILSON ([id number]) [id number] Page 4 of 15 04-21-2022 upon evaluation today patient appears to be doing excellent in regard to his wound in fact this is very close to resolution based on what I am seeing. I do not see any evidence of active infection locally or systemically at this time which is great news and overall I am extremely pleased with where we are today. 05-05-2022 upon evaluation today patient's wound actually appears to potentially be completely healed. Fortunately I do not see any evidence of active infection at this time which is great news and overall I am very pleased I think this needs a little time to toughen up but other than that I really do believe were doing quite well. He is very pleased to hear this its been a long time coming. 05-19-2022 upon evaluation today patient appears to be doing well currently in regard to his wound. He in fact we were hoping will  be completely healed and closed but it still has a very tiny area right in the center which is still continuing to drain. Fortunately I do not see any signs of infection locally or systemically which is great news. 11/6; this wound is close to completely" he comes in this week with some erythema at 1 edge of this which looks like something was rubbing on the area. Other than that no evidence of infection 06-16-2022 upon evaluation today patient appears to be doing well currently in regard to his ankle ulcer. This is very close to being healed but still has a small area in the central portion which is not. Fortunately there does not appear to be any signs of infection locally or systemically which is great news. No fevers, chills, nausea, vomiting, or diarrhea. 06-30-2022 upon evaluation patient's wound pretty much appears to be almost completely closed. In fact it may even be closed but there are still a lot of skin irritation around the edges of the wound and I just do not feel great about releasing them yet I would like to continue to monitor this just a little bit longer before getting to that point. He is not opposed to this and in fact is happy to continue to come in if need be in order to make sure that things are moving in the right direction he definitely does not want to backtrack. 07-14-2022 upon evaluation today patient appears to  be doing well currently in regard to his wound. Has been tolerating the dressing changes without complication. Fortunately I see no evidence of active infection locally nor systemically which is great news. No fevers, chills, nausea, vomiting, or diarrhea. 08-11-2022 upon evaluation today patient appears to be doing a little worse than last time I saw him although it has been a month. Unfortunately he was not able to be seen due to the fact that he was quarantined in the facility with COVID. Subsequently he tells me that he mention to the facility staff several  times about taking it off over the past several weeks but they continue to tell him that someone have to get in touch with [place] here although nobody ever did until Friday when the nurse manager called to have that documented in the communication notes. With that being said unfortunately this means that he had a wrap on that he was supposed to have on for only 1 week for ended up being on four 1 month essentially. I am glad that there is nothing worse than what we see currently to be perfectly honest. 08-19-2022 upon evaluation today patient appears to be doing well currently in regard to his wounds. He has been tolerating the dressing changes without complication. This is actually smaller today which is great news after last week's reopening. Fortunately I do not see any evidence of infection locally nor systemically at this point. 08-26-2022 upon evaluation today patient appears to be doing well currently in regard to his wound. He has been tolerating the dressing changes without complication. Fortunately there does not appear to be any signs of active infection locally nor systemically which is great news. No fevers, chills, nausea, vomiting, or diarrhea. 09-02-2022 upon evaluation today patient's wound actually showing signs of excellent improvement this is measuring smaller and looking much better. I am very pleased with where we stand I do believe that we are headed in the right direction. 09-09-2022 upon evaluation today patient appears to be doing decently well in regard to his leg in general although there was some swelling this is the 1 thing that I am not too happy with based on what I see currently. Fortunately there does not appear to be any signs of infection locally nor systemically at this point. 09-16-2022 upon evaluation today patient appears to be doing well currently in regard to his wound. He is actually showing signs of improvement he wore the Tubigrip this week and it significantly  better compared to last week's evaluation. Fortunately I do not see any evidence of active infection locally or systemically which is great news. Overall I think that were headed in the right direction which is great news. In fact overall I think this is the best that he has looked in some time. He does not like the Tubigrip but actually did extremely well with this in my opinion. I think he needs to continue to utilize this. 09-23-2022 upon evaluation today patient appears to be doing well currently in regard to his wound. He has been tolerating the dressing changes without complication. Fortunately there does not appear to be any signs of active infection locally nor systemically which is great news and in general I do feel like that we are headed in the right direction. He has been having trouble with the Tubigrip however we have gotten the facility to order compression socks he has not gotten them as of yet. 3/4; patient is about the same with a wound on the  left posterior leg. This is a venous wound. He needs more compression on this leg but he took off the previous wraps we are using. I am not certain whether he is using juxta lite stockings at the nursing home. 3/12; patient presents for follow-up. He unfortunately has 2 new wounds 1 to the dorsal left side and the other to the right heel. He is not sure how these happened. The right heel appears to be caused by pressure. He has been using Hydrofera Blue to the original wound on the left leg. 10-14-2022 upon evaluation today patient appears to be doing well currently in regard to his original wound but unfortunately he has several new wounds which I was completely unaware of before walking into the room to see him today. Unfortunately he seems to not be doing nearly as well as what I saw when I last had an appointment with him 3 weeks ago. Fortunately I do not see any signs of systemic infection though unfortunately locally he has definite  infection of the left lower extremity. He is currently on doxycycline I am going to likely have to extend that today. 10-21-2022 upon evaluation today patient appears to be doing well currently in regard to his wounds. He seems to be tolerating the dressing changes without complication. Fortunately there does not appear to be any signs of active infection locally or systemically which is great news and in general I definitely feel like we are on the right track here. 10-30-2022 upon evaluation today patient still continues to not be doing too well at all with regard to his wounds. Unfortunately he is still having signs of infection as well which also has been concerned. Fortunately I do not see any signs of active infection locally nor systemically at this time. No fevers, chills, nausea, vomiting, or diarrhea. 11-06-2022 upon evaluation today patient appears to be doing okay currently in regard to his wounds. All things considered with his blood flow not being nearly as good as what should I think that he is making pretty good progress here. Fortunately I do not see any signs of infection locally nor systemically which is great news. No fevers, chills, nausea, vomiting, or diarrhea. 11-13-2022 upon evaluation today patient appears to be doing poorly currently in regard to his wounds. Things seem to be getting a little bit worse not better. 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 Subsequently he is also going to need to have vascular intervention but we are unsure exactly when this is going on the undertaking. I would try to actually send a message to Dr. Hazle Quant and the vascular team just to see what exactly the plan is going forward. The patient is in agreement with that plan I just think we need to have a plan especially in light of the fact the left leg seems to be getting a little bit worse at this  point. 11-25-2022 upon evaluation today patient is currently now discharged from the hospital following his colostomy procedure. That did very well and he seems to [facility], [name] ([id number]) [id number] Page 5 of 15 be doing well. Fortunately there does not appear to be any signs of active infection locally or systemically which is great news. No fevers, chills, nausea, vomiting, or diarrhea. 12-04-2022 upon evaluation today patient appears to be doing actually decently well in regard to his wounds and actually very pleased with where  we stand and I think that he is making some pretty good progress here. Fortunately I do not see any signs of active infection locally nor systemically which is great news. No fevers, chills, nausea, vomiting, or diarrhea. 12-12-2022 upon evaluation today patient appears to be doing well currently in regard to his wound. He has been tolerating the dressing changes without complication. The wounds all seem to be making some progress to a degree. He has appointment with vascular on the 23rd of this month. 5/24; the patient was scheduled for a left lower extremity angiogram yesterday but apparently they were overbooked and they wanted to admit him to hospital last night so they could do this early this morning but he refused I see that Dr. Wyn Quaker has rescheduled him for June 6. He is using silver alginate to wounds on his bilateral lower extremities. 01-02-2023 upon evaluation today patient appears to be doing poorly in regard to his wounds still. Subsequently he did have his angiogram but apparently there really was not much that was found that could be revascularized. The patient tells me that Dr. Wyn Quaker was going to contact me here in the office I have not heard from him as of yet and I may see about giving him a call just to follow-up or possibly send a message through epic which could be an option as well. 01-09-2023 upon evaluation today patient  appears to be doing poorly in regard to his legs he is extremely swollen. He has been sleeping in his chair he tells me for the past week. His bed is broken and they have ordered a new one which is supposedly supposed to be there today. With that being said he the way he needs to be not sleeping in his chair that is his wheelchair every day and through the night. This is causing his legs to be much more swollen and it seen and how badly wounds are doing at this point. This has made things significantly worse. Fortunately I do not see any signs of infection right now unfortunately I do believe that the patient has a tremendous amount of drainage currently. 01-19-2023 upon evaluation today patient appears to be doing poorly in regard to his wounds of his legs his swelling is better but still not as good as what we would like to see. Fortunately I do not see any evidence of active infection locally nor systemically which is great news and in general I do believe that we are moving in the right direction here. Nonetheless it would be better if she was in the bed sleeping or not send up in his wheelchair although he seems to still be set up in the wheelchair at this time. He tells me that he is just not as comfortable in the bed as the wheelchair but unfortunately this is causing damage to his legs which I discussed with him today as well. Nonetheless I am not sure that he is going to be amendable to doing anything different. Electronic Signature(s) Signed: 01/19/2023 2:16:38 PM By: [name] PA-C Entered By: [name] on 01/19/2023 14:16:37 -------------------------------------------------------------------------------- Physical Exam Details Patient Name: Date of Service: VIRGIE, CHERY 01/19/2023 1:15 PM Medical Record Number: [id number] Patient Account Number: [id number] Date of Birth/Sex: Treating RN: June 11, 1961 (62 y.o. Chad [name] Primary Care Provider: [name] Other  Clinician: Referring Provider: Treating Provider/Extender: [name] Weeks in Treatment: 80 Constitutional Well-nourished and well-hydrated in no acute distress. Respiratory normal breathing without difficulty. Psychiatric this  patient is able to make decisions and demonstrates good insight into disease process. Alert and Oriented x 3. pleasant and cooperative. Notes Upon inspection patient's wound bed actually again at all locations showed signs of still not doing quite as well as we would like to have seen. Fortunately I do not see any evidence of active infection locally or systemically which is great news and in general I do think we are doing better as far as that is concerned. Nonetheless I do think that he is really in a situation where he will be doing a lot better if he would get in bed to sleep and not sit up in his wheelchair throughout the night. Electronic Signature(s) Signed: 01/19/2023 2:17:21 PM By: [name] PA-C Entered By: [name] on 01/19/2023 14:17:21 [name] ([id number]) [id number] Page 6 of 15 -------------------------------------------------------------------------------- Physician Orders Details Patient Name: Date of Service: NASH, BOLLS 01/19/2023 1:15 PM Medical Record Number: [id number] Patient Account Number: [id number] Date of Birth/Sex: Treating RN: 17-Oct-1960 (62 y.o. 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 Nurse Visit as needed Bathing/ Shower/ Hygiene May shower with wound dressing protected with water repellent cover or cast protector. No tub bath. Anesthetic (Use 'Patient Medications' Section for Anesthetic Order Entry) Lidocaine applied to wound bed Edema Control - Lymphedema / Segmental Compressive Device / Other Elevate legs to the level of the heart and pump ankles as often as possible Elevate leg(s) parallel to the floor when sitting. Other: - PATIENT MUST SLEEP IN A BED. Legs are swelling due to no elevation of legs. Off-Loading Heel suspension boot - when in bed Medications-Please add to medication list. Other: - apply AandD ointment to leg, do not apply to wound Wound Treatment Wound #12 - Ankle Wound Laterality: Left, Medial, Distal Cleanser: Soap and Water 3 x Per Week/30 Days Discharge Instructions: Gently cleanse wound with antibacterial soap, rinse and pat dry prior to dressing wounds Cleanser: Wound Cleanser 3 x Per Week/30 Days Discharge Instructions: Wash your hands with soap and water. Remove old dressing, discard into plastic bag and place into trash. Cleanse the wound with Wound Cleanser prior to applying a clean dressing using gauze sponges, not tissues or cotton balls. Do not scrub or use excessive force. Pat dry using gauze sponges, not tissue or cotton balls. Prim Dressing: Silvercel Small 2x2 (in/in) 3 x Per Week/30 Days ary Discharge Instructions: Apply Silvercel Small 2x2 (in/in) as instructed Secondary Dressing: Zetuvit Plus 4x8 (in/in) 3 x Per Week/30 Days Compression Wrap: UNNA BOOT - Unna-Z Zinc and Calamine Boot, 4x10 (in/yd) 3 x Per Week/30 Days Discharge Instructions: Unna Paste, Kerlix and Coban from base of toes to three finger widths below bend in knee. Wound #15 - Foot Wound Laterality:  Dorsal, Left Cleanser: Soap and Water 3 x Per Week/30 Days Discharge Instructions: Gently cleanse wound with antibacterial soap, rinse and pat dry prior to dressing wounds [name] ([id number]) [phone].pdf Page 7 of 15 Cleanser: Wound Cleanser 3 x Per  Week/30 Days Discharge Instructions: Wash your hands with soap and water. Remove old dressing, discard into plastic bag and place into trash. Cleanse the wound with Wound Cleanser prior to applying a clean dressing using gauze sponges, not tissues or cotton balls. Do not scrub or use excessive force. Pat dry using gauze sponges, not tissue or cotton balls. Prim Dressing: Silvercel Small 2x2 (in/in) 3 x Per Week/30 Days ary Discharge Instructions: Apply Silvercel Small 2x2 (in/in) as instructed Secondary Dressing: Zetuvit Plus 4x8 (in/in) 3 x Per Week/30 Days Compression Wrap: UNNA BOOT - Unna-Z Zinc and Calamine Boot, 4x10 (in/yd) 3 x Per Week/30 Days Discharge Instructions: Unna Paste, Kerlix and Coban from base of toes to three finger widths below bend in knee. Wound #16 - Calcaneus Wound Laterality: Right Cleanser: Soap and Water 3 x Per Week/30 Days Discharge Instructions: Gently cleanse wound with antibacterial soap, rinse and pat dry prior to dressing wounds Cleanser: Wound Cleanser 3 x Per Week/30 Days Discharge Instructions: Wash your hands with soap and water. Remove old dressing, discard into plastic bag and place into trash. Cleanse the wound with Wound Cleanser prior to applying a clean dressing using gauze sponges, not tissues or cotton balls. Do not scrub or use excessive force. Pat dry using gauze sponges, not tissue or cotton balls. Prim Dressing: Silvercel Small 2x2 (in/in) 3 x Per Week/30 Days ary Discharge Instructions: Apply Silvercel Small 2x2 (in/in) as instructed Secondary Dressing: Zetuvit Plus 4x8 (in/in) 3 x Per Week/30 Days Compression Wrap: UNNA BOOT - Unna-Z Zinc and Calamine Boot,  4x10 (in/yd) 3 x Per Week/30 Days Discharge Instructions: Unna Paste, Kerlix and Coban from base of toes to three finger widths below bend in knee. Wound #17 - Lower Leg Wound Laterality: Left, Anterior Cleanser: Soap and Water 3 x Per Week/30 Days Discharge Instructions: Gently cleanse wound with antibacterial soap, rinse and pat dry prior to dressing wounds Cleanser: Wound Cleanser 3 x Per Week/30 Days Discharge Instructions: Wash your hands with soap and water. Remove old dressing, discard into plastic bag and place into trash. Cleanse the wound with Wound Cleanser prior to applying a clean dressing using gauze sponges, not tissues or cotton balls. Do not scrub or use excessive force. Pat dry using gauze sponges, not tissue or cotton balls. Prim Dressing: Silvercel Small 2x2 (in/in) 3 x Per Week/30 Days ary Discharge Instructions: Apply Silvercel Small 2x2 (in/in) as instructed Secondary Dressing: Zetuvit Plus 4x8 (in/in) 3 x Per Week/30 Days Compression Wrap: UNNA BOOT - Unna-Z Zinc and Calamine Boot, 4x10 (in/yd) 3 x Per Week/30 Days Discharge Instructions: Unna Paste, Kerlix and Coban from base of toes to three finger widths below bend in knee. Wound #18 - T Second oe Wound Laterality: Right, Medial Cleanser: Soap and Water Every Other Day/30 Days Discharge Instructions: Gently cleanse wound with antibacterial soap, rinse and pat dry prior to dressing wounds Cleanser: Wound Cleanser Every Other Day/30 Days Discharge Instructions: Wash your hands with soap and water. Remove old dressing, discard into plastic bag and place into trash. Cleanse the wound with Wound Cleanser prior to applying a clean dressing using gauze sponges, not tissues or cotton balls. Do not scrub or use excessive force. Pat dry using gauze sponges, not tissue or cotton balls. Prim Dressing: Silvercel Small 2x2 (in/in) Every Other Day/30 Days ary Discharge Instructions: Apply Silvercel Small 2x2 (in/in) as  instructed Secondary Dressing: Coverlet Latex-Free Fabric Adhesive Dressings (Generic) Every Other Day/30 Days Discharge Instructions: Knuckle Wound #19 - Lower Leg Wound  Laterality: Left, Posterior Cleanser: Soap and Water 3 x Per Week/30 Days Discharge Instructions: Gently cleanse wound with antibacterial soap, rinse and pat dry prior to dressing wounds Cleanser: Wound Cleanser 3 x Per Week/30 Days Discharge Instructions: Wash your hands with soap and water. Remove old dressing, discard into plastic bag and place into trash. Cleanse the wound with Wound Cleanser prior to applying a clean dressing using gauze sponges, not tissues or cotton balls. Do not scrub or use excessive force. Pat dry using gauze sponges, not tissue or cotton balls. Prim Dressing: Silvercel Small 2x2 (in/in) 3 x Per Week/30 Days ary Discharge Instructions: Apply Silvercel Small 2x2 (in/in) as instructed Secondary Dressing: Zetuvit Plus 4x8 (in/in) 3 x Per Week/30 Days Compression Wrap: UNNA BOOT - Unna-Z Zinc and Calamine Boot, 4x10 (in/yd) 3 x Per Week/30 Days KERMITT, HARJO ([id number]) [id number] Page 8 of 15 Discharge Instructions: Melony Overly, Kerlix and Coban from base of toes to three finger widths below bend in knee. Electronic Signature(s) Signed: 01/19/2023 5:22:58 PM By: Midge Aver MSN RN CNS WTA Signed: 01/19/2023 5:56:33 PM By: [name] PA-C Entered By: Midge Aver on 01/19/2023 14:38:13 -------------------------------------------------------------------------------- Problem List Details Patient Name: Date of Service: JAYJAY, LITTLES 01/19/2023 1:15 PM Medical Record Number: [id number] Patient Account Number: [id number] Date of Birth/Sex: Treating RN: May 04, 1961 (62 y.o. Chad [name] Primary Care Provider: [name] Other Clinician: Referring Provider: Treating Provider/Extender: [name] Weeks in Treatment: 58 Active  Problems ICD-10 Encounter Code Description Active Date MDM Diagnosis 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 (idiopathic) with ulcer and inflammation of left 12/06/2021 No Yes lower extremity L97.322 Non-pressure chronic ulcer of left ankle with fat layer exposed 12/06/2021 No Yes L98.492 Non-pressure chronic ulcer of skin of other sites with fat layer exposed 08/11/2022 No Yes I69.354 Hemiplegia and hemiparesis following cerebral infarction affecting left non- 12/06/2021 No Yes dominant side 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, stage 3 10/07/2022 No Yes Inactive Problems Resolved Problems Electronic Signature(s) Signed: 01/19/2023 1:22:56 PM By: [name] PA-C Entered By: [name] on 01/19/2023 13:22:56 [name] ([id number]) [id number] Page 9 of 15 -------------------------------------------------------------------------------- Progress Note Details Patient Name: Date of Service: CORD, WILCZYNSKI 01/19/2023 1:15 PM Medical Record Number: [id number] Patient Account Number: [id number] Date of Birth/Sex: Treating RN: [birth year] (62 y.o. Chad [name] Primary Care Provider: [name] Other Clinician: Referring Provider: Treating Provider/Extender: [name] Weeks in Treatment: 31 Subjective Chief Complaint Information obtained from Patient Left ankle ulcer, Left dorsal foot wound, right heel History of Present Illness (HPI) 10/08/18 on evaluation today patient actually presents to our office for initial evaluation concerning wounds that he has of the bilateral lower extremities. 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 He also has bilateral venous stasis. He apparently has been homeless currently following discharge from the hospital apparently he has been placed at [facility] which is is a skilled nursing facility locally. Nonetheless fortunately he does not show any signs of infection at this time which is good news. In fact several of the wound actually appears to be showing some signs of improvement already in my pinion. There are a couple areas in the left leg in particular there likely gonna require some sharp debridement to help clear away some necrotic tissue and help with more sufficient healing. No fevers, chills, nausea, or vomiting noted  at this time. 10/15/18 on evaluation today patient actually appears to be doing very well in regard to his bilateral lower extremities. He's been tolerating the dressing changes without complication. Fortunately there does not appear to be any evidence of active infection at this time which is great news. Overall I'm actually very pleased with how this has progressed in just one visits time. Readmission: 08/14/2020 upon evaluation today patient presents for re-evaluation here in our clinic. He is having issues with his left ankle region as well as his right toe and his right heel. He tells me that the toe and heel actually began as a area that was itching that he was scratching and then subsequently opened up into wounds. These may have been abscess areas I presume based on what I am seeing currently. With regard to his left ankle region he tells me this was a similar type occurrence although he does have venous stasis this very well may be more of a venous leg ulcer more than anything. Nonetheless I do believe that the patient would benefit from appropriate and aggressive wound care to try to help get things under better control here. He does have history of a stroke on the left side affecting him to some degree there that he is able to stand although he does have some residual weakness. Otherwise again the patient does have chronic venous insufficiency as previously noted. His arterial studies most recently obtained  showed that he had an ABI on the right of 1.16 with a TBI of 0.52 and on the left and ABI of 1.14 with a TBI of 0.81. That was obtained on 06/19/2020. 08/28/2020 upon evaluation today patient appears to be doing decently well in regard to his wounds in general. He has been tolerating the dressing changes without complication. Fortunately there does not appear to be any signs of active infection which is great news. With that being said I think the [facility] is doing a good job I would recommend that we likely continue with that currently. 09/11/2020 upon evaluation today patient's wounds did not appear to be doing too poorly but again he is not really showing signs of significant improvement with regard to any of the wounds on the right. None of them have Hydrofera Blue on them I am not exactly sure why this is not being followed as the facility did not contact us to let us know of any issues with obtaining dressings or otherwise. With that being said he is supposed to be using Hydrofera Blue on both of the wounds on the right foot as well as the ankle wound on the left side. 09/18/2020 upon evaluation today patient appears to be doing poorly with regard to his wounds. Again right now the left ankle in particular showed signs of extreme maceration. Apparently he was told by someone with staff at [facility] they could not get the [facility]. With that being said this is something that is never been relayed to [place] one way or another. Also the patient subsequently has not supposed to have a border gauze dressing on. He should have an ABD pad and roll gauze to secure as this drains much too much just to have a border gauze dressing to cover. Nonetheless the fact that they are not using the appropriate dressing is directly causing deterioration of the left ankle wound it is significantly worse today compared to what it was previous. I did attempt to call [facility] while the patient  was  here I called three times and got no one to even pick up the phone. After this I had my for an office coordinator call and she was able to finally get through and leave a message with the D ON as of dictation of this note which is roughly about an hour and a half later I still have not been able to speak with anyone at the facility. 09/25/2020 upon evaluation today patient actually showing signs of good improvement which is excellent news. He has been tolerating the dressing changes without complication. Fortunately there is no signs of active infection which is great news. No fevers, chills, nausea, vomiting, or diarrhea. I do feel like the facility has been doing a much better job at taking care of him as far as the dressings are concerned. However the director of nursing never did call me back. 10/09/2020 upon evaluation today patient appears to be doing well with regard to his wound. The toe ulcer did require some debridement but the other 2 areas actually appear to be doing quite well. 10/19/2020 upon evaluation today patient actually appears to be doing very well in regard to his wounds. In fact the heel does appear to be completely healed. The toe is doing better in the medial ankle on the left is also doing better. Overall I think he is headed in the right direction. 10/26/2020 upon evaluation today patient appears to be doing well with regard to his wound. He is showing signs of improvement which is great news and overall I am very pleased with where things stand today. No fevers, chills, nausea, vomiting, or diarrhea. 11/02/2020 upon evaluation today patient appears to be doing well with regard to his wounds. He has been tolerating the dressing changes without complication overall I am extremely pleased with where things stand today. He in regard to the toe is almost completely healed and the medial ankle on the left is doing much better. DELONTAE, LAMM ([id number])  [id number] Page 10 of 15 11/09/2020 upon evaluation today patient appears to be doing a little poorly in regard to his left medial ankle ulcer. Fortunately there does not appear to be any signs of systemic infection but unfortunately locally he does appear to be infected in fact he has blue-green drainage consistent with Pseudomonas. 11/16/2020 upon evaluation today patient appears to be doing well with regard to his wound. It actually appears to be doing better. I did place him on gentamicin cream since the Cipro was actually resistant even though he was positive for Pseudomonas on culture. Overall I think that he does seem to be doing better though I am unsure whether or not they have actually been putting the cream on. The patient is not sure that we did talk to the nurse directly and she was going to initiate that treatment. Fortunately there does not appear to be any signs of active infection at this time. No fevers, chills, nausea, vomiting, or diarrhea. 4/28; the area on the right second toe is close to healed. Left medial ankle required debridement 12/07/2020 upon evaluation today patient appears to be doing well with regard to his wounds. In fact the right second toe appears to be completely healed which is great news. Fortunately there does not appear to be any signs of active infection at this time which is also great news. I think we can probably discontinue the gentamicin on top of everything else. 12/14/2020 upon evaluation today patient appears to be doing well with regard to his wound.  He is making good progress and overall very pleased with where things stand today. There is no signs of active infection at this time which is great news. 12/28/2020 upon evaluation today patient appears to be doing well with regard to his wounds. He has been tolerating the dressing changes without complication. Fortunately there is no signs of active infection at this time. No  fevers, chills, nausea, vomiting, or diarrhea. 12/28/2020 upon evaluation today patient's wound bed actually showed signs of excellent improvement. He has great epithelization and granulation I do not see any signs of infection overall I am extremely pleased with where things stand at this point. No fevers, chills, nausea, vomiting, or diarrhea. 01/11/2021 upon evaluation today patient appears to be doing well with regard to his wound on his leg. He has been tolerating the dressing changes without complication. Fortunately there does not appear to be any signs of active infection which is great news. No fevers, chills, nausea, vomiting, or diarrhea. 01/25/2021 upon evaluation today patient appears to be doing well with regard to his wound. He has been tolerating the dressing changes without complication. Fortunately the collagen seems to be doing a great job which is excellent news. No fevers, chills, nausea, vomiting, or diarrhea. 02/08/2021 upon evaluation today patient's wound is actually looking a little bit worse especially in the periwound compared to previous. Fortunately there does not appear to be any signs of infection which is great news with that being said he does have some irritation around the periphery of the wound which has me more concerned. He actually had a dressing on that had not been changed in 3 days. He also is supposed to have daily dressing changes. With regard to the dressing applied he had a silver alginate dressing and silver collagen is what is recommended and ordered. He also had no Desitin around the edges of the wound in the periwound region although that is on the order inspect to be done as well. In general I was very concerned I did contact [facility] actually spoke with Verlon Au who is the scheduling individual and subsequently she stated that she would pass the information to the D ON apparently the D ON was not available to talk to me when I call  today. 02/18/2021 upon evaluation today patient's wound is actually showing signs of improvement. Fortunately there does not appear to be any evidence of infection which is great news overall I am extremely pleased with where things stand today. No fevers, chills, nausea, vomiting, or diarrhea. 8/3; patient presents for 1 week follow-up. He has no issues or complaints today. He denies signs of infection. 03/11/2021 upon evaluation today patient appears to be doing well with regard to his wound. He does have a little bit of slough noted on the surface of the wound but fortunately there does not appear to be any signs of active infection at this time. No fevers, chills, nausea, vomiting, or diarrhea. 03/18/2021 upon evaluation today patient appears to be doing well with regard to his wound. He has been tolerating the dressing changes without complication. There was a little irritation more proximal to where the wound was that was not noted last week but nonetheless this is very superficial just seems to be more irritation we just need to make sure to put a good amount of the zinc over the area in my opinion. Otherwise he does not seem to be doing significantly worse at all which is great news. 03/25/2021 upon evaluation today patient appears  to be doing well with regard to his wound. He is going require some sharp debridement today to clear with some of the necrotic debris. I did perform this today without complication postdebridement wound bed appears to be doing much better this is great news. 04/08/2021 upon evaluation today patient appears to be doing decently well in regard to his wound although the overall measurement is not significantly smaller compared to previous. It is gone down a little bit but still the facility continues to not really put the appropriate dressings in place in fact he was supposed to have collagen we think he probably had more of an allergy to At this point. Fortunately there  does not appear to be any signs of active infection systemically though locally I do not see anything on initial visualization either as far as erythema or warmth. 04/15/2021 upon evaluation today patient appears to be doing well with regard to his wound. He is actually showing signs of improvement. I did place him on antibiotics last week, Cipro. He has been taking that 2 times a day and seems to be tolerating it very well. I do not see any evidence of worsening and in fact the overall appearance of the wound is smaller today which is also great news. 9/26; left medial ankle chronic venous insufficiency wound is improved. Using Hydrofera Blue 10/10; left medial ankle chronic venous insufficiency. Wound has not changed much in appearance completely nonviable surface. Apparently there have been problems getting the right product on the wound at the facility although he came in with [facility] on today 05/14/2021 upon evaluation today patient appears to be doing well with regard to his wound. I think he is making progress here which is good news. Fortunately there does not appear to be any signs of active infection at this time. No fevers, chills, nausea, vomiting, or diarrhea. 05/20/2021 upon evaluation today patient appears to be doing well with regard to his wound. He is showing signs of good improvement which is great news. There does not appear to be any evidence of active infection which is also excellent news. No fevers, chills, nausea, vomiting, or diarrhea. 05/28/2021 upon evaluation today patient appears to be doing quite well. There does not appear to be any signs of active infection at this time which is great news. Overall I am extremely pleased with where things stand today. I think he is headed in the right direction. 06/11/2021 upon evaluation today patient appears to be doing well with regard to his left ankle ulcer and poorly in regard to the toe ulcer on the second toe right foot.  This appears to show signs of joint exposure. Apparently this has been present for 1 to 2 months although he kept forgetting to tell me about it. That is unfortunate as right now it definitely appears to be doing significantly worse than what I would like to see. There does not appear to be any signs of active infection systemically though locally I am concerned about the possibility of infection the toe is quite red. Again no one from the facility ever contacted [place] to advise that this was going on in the interim either. 06/17/2021 upon evaluation today patient presents for follow-up I did review his x-ray which showed a navicular bone fracture I am unsure of the chronicity of this. Subsequently he also had osteomyelitis of the toe which was what I was more concerned about this did not show up on x-ray but did show up on the  pathology scrapings. This was listed as acute osteomyelitis. Nonetheless at this point I think that the antibiotic treatment is the best regimen to go with currently. The patient is in agreement with that plan. Nonetheless he has initially 30 days of doxycycline off likely extend that towards the end of the treatment cycle that will be around the middle of December for an additional 2 weeks. That all depends on how well he continues to heal. Nonetheless based on what I am seeing in the foot I did want a proceed with an MRI as well which I think will be helpful to identify if there is anything else that needs to be addressed from the standpoint of infection. 06/24/2021 upon evaluation today patient appears to be doing pretty well in regards to his wounds. I think both are actually showing signs of improvement which is good I did review his MRI today which did show signs of osteomyelitis of the middle and proximal phalanx on his right foot of the affected toe. With that being said this is actually showing signs of significant improvement today already with the antibiotic therapy I  think the redness is also improved. Overall I AVINASH, MALTOS ([id number]) [id number] Page 11 of 15 think that we just need to give this some time with appropriate wound care we will see how things go potentially hyperbarics could be considered. 07/02/2021 upon inspection today patient actually appears to be doing well in regard to his left ankle which is getting very close to complete resolution of pleased in that regard. Unfortunately he is continuing to have issues with his second toe right foot and this seems to still be very painful for him. Recommend he try something different from the standpoint of antibiotics. 07/15/2021 upon evaluation today patient appears to be doing actually pretty well in regard to his foot. This is actually showing signs of significant improvement which is great news. Overall I feel like the patient is improving both in regard to the second toe as well as the ankle on the left. With that being said the biggest issue that I do see currently is that he is needing to have a refill of the doxycycline that we previously treated him with. He also did see podiatry they are not going to recommend any amputation at this point since he seems to be doing quite well. For that reason we just need to keep things under control from an infection standpoint. 08/01/2021 upon evaluation today patient appears to be doing well with regard to his wound. He has been tolerating the dressing changes without complication. Fortunately there does not appear to be any evidence of active infection locally nor systemically at this point. In fact I think everything is doing excellent in fact his second toe on the right foot is almost healed and the ankle on the left ankle region is actually very close to being healed as well. 08/08/2021 upon evaluation today patient appears to be doing well with regard to his wound. He has been tolerating the dressing changes without  complication. Fortunately I do not see any signs of active infection at this time. Readmission: 12-06-2021 upon evaluation today patient presents for reevaluation here in the clinic he does tell me that he was being seen in facility at [facility] by a provider that was coming in. He is not sure who this was. He tells me however that the wound seems to have gotten worse even compared to where it was when we last saw him at  this point. With that being said I do believe that he is likely going need ongoing wound care here in the clinic and I do believe that we need to be the ones to frontline this since his wound does seem to be getting worse not better at this point. He voiced understanding. He is also in agreement with this plan and feels more comfortable coming here she tells me. Patient's medical history really has not changed since his prior admission he was only gone since January. 12-27-2021 upon evaluation today patient appears to be doing well with regard to his wound they did run out of the [facility] so they did not put anything on just an ABD pad with gentamicin. Still we are seeing some signs of good improvement here with some new epithelization which is great news. 01-10-2022 upon evaluation today patient appears to be doing well with regard to his wounds and he is going require some sharp debridement but overall seems to be making good progress. Fortunately I do not see any evidence of active infection locally or systemically at this time which is great news. 01-24-2022 upon evaluation today patient appears to be doing well with regard to his wound. The facility actually came and dropped him off early and he had another appointment at the hospital and then they just brought him over here and this was still hours before his appointment this afternoon. For that reason we did do our best to work him in this morning and fortunately had some space to make this happen. With that being  said patient's wound does seem to be making progress here and I am very pleased in that regard I do not see any signs of active infection locally or systemically at this time. 02-07-2022 patient appears to be doing well currently in regard to his wounds. In fact one of them the more proximal is healed the distal is still open but seems to be doing excellent. Fortunately I do not see any evidence of active infection locally or systemically at this time which is great news. No fevers, chills, nausea, vomiting, or diarrhea. 7/27; left medial ankle venous. Improving per our intake nurse. We are using Hydrofera Blue under Tubigrip compression. They are changing that at his facility 03-06-2022 upon evaluation today patient appears to be doing well with regard to the his wound he is can require some sharp debridement but seems to be making excellent progress. Fortunately I do not see any evidence of active infection locally or systemically which is great news. 03-27-2022 upon evaluation today patient's wound is actually showing signs of excellent improvement. Fortunately I see no signs of active infection locally or systemically at this time which is great news. No fevers, chills, nausea, vomiting, or diarrhea. 04-21-2022 upon evaluation today patient appears to be doing excellent in regard to his wound in fact this is very close to resolution based on what I am seeing. I do not see any evidence of active infection locally or systemically at this time which is great news and overall I am extremely pleased with where we are today. 05-05-2022 upon evaluation today patient's wound actually appears to potentially be completely healed. Fortunately I do not see any evidence of active infection at this time which is great news and overall I am very pleased I think this needs a little time to toughen up but other than that I really do believe were doing quite well. He is very pleased to hear this its been a  long time  coming. 05-19-2022 upon evaluation today patient appears to be doing well currently in regard to his wound. He in fact we were hoping will be completely healed and closed but it still has a very tiny area right in the center which is still continuing to drain. Fortunately I do not see any signs of infection locally or systemically which is great news. 11/6; this wound is close to completely" he comes in this week with some erythema at 1 edge of this which looks like something was rubbing on the area. Other than that no evidence of infection 06-16-2022 upon evaluation today patient appears to be doing well currently in regard to his ankle ulcer. This is very close to being healed but still has a small area in the central portion which is not. Fortunately there does not appear to be any signs of infection locally or systemically which is great news. No fevers, chills, nausea, vomiting, or diarrhea. 06-30-2022 upon evaluation patient's wound pretty much appears to be almost completely closed. In fact it may even be closed but there are still a lot of skin irritation around the edges of the wound and I just do not feel great about releasing them yet I would like to continue to monitor this just a little bit longer before getting to that point. He is not opposed to this and in fact is happy to continue to come in if need be in order to make sure that things are moving in the right direction he definitely does not want to backtrack. 07-14-2022 upon evaluation today patient appears to be doing well currently in regard to his wound. Has been tolerating the dressing changes without complication. Fortunately I see no evidence of active infection locally nor systemically which is great news. No fevers, chills, nausea, vomiting, or diarrhea. 08-11-2022 upon evaluation today patient appears to be doing a little worse than last time I saw him although it has been a month. Unfortunately he was not able to be seen due  to the fact that he was quarantined in the facility with COVID. Subsequently he tells me that he mention to the facility staff several times about taking it off over the past several weeks but they continue to tell him that someone have to get in touch with [place] here although nobody ever did until Friday when the nurse manager called to have that documented in the communication notes. With that being said unfortunately this means that he had a wrap on that he was supposed to have on for only 1 week for ended up being on four 1 month essentially. I am glad that there is nothing worse than what we see currently to be perfectly honest. 08-19-2022 upon evaluation today patient appears to be doing well currently in regard to his wounds. He has been tolerating the dressing changes without complication. This is actually smaller today which is great news after last week's reopening. Fortunately I do not see any evidence of infection locally nor systemically at this point. 08-26-2022 upon evaluation today patient appears to be doing well currently in regard to his wound. He has been tolerating the dressing changes without complication. Fortunately there does not appear to be any signs of active infection locally nor systemically which is great news. No fevers, chills, nausea, NEEL, BUFFONE ([id number]) [phone].pdf Page 12 of 15 vomiting, or diarrhea. 09-02-2022 upon evaluation today patient's wound actually showing signs of excellent improvement this is measuring smaller and looking much better. I am  very pleased with where we stand I do believe that we are headed in the right direction. 09-09-2022 upon evaluation today patient appears to be doing decently well in regard to his leg in general although there was some swelling this is the 1 thing that I am not too happy with based on what I see currently. Fortunately there does not appear to be any signs of infection locally nor  systemically at this point. 09-16-2022 upon evaluation today patient appears to be doing well currently in regard to his wound. He is actually showing signs of improvement he wore the Tubigrip this week and it significantly better compared to last week's evaluation. Fortunately I do not see any evidence of active infection locally or systemically which is great news. Overall I think that were headed in the right direction which is great news. In fact overall I think this is the best that he has looked in some time. He does not like the Tubigrip but actually did extremely well with this in my opinion. I think he needs to continue to utilize this. 09-23-2022 upon evaluation today patient appears to be doing well currently in regard to his wound. He has been tolerating the dressing changes without complication. Fortunately there does not appear to be any signs of active infection locally nor systemically which is great news and in general I do feel like that we are headed in the right direction. He has been having trouble with the Tubigrip however we have gotten the facility to order compression socks he has not gotten them as of yet. 3/4; patient is about the same with a wound on the left posterior leg. This is a venous wound. He needs more compression on this leg but he took off the previous wraps we are using. I am not certain whether he is using juxta lite stockings at the nursing home. 3/12; patient presents for follow-up. He unfortunately has 2 new wounds 1 to the dorsal left side and the other to the right heel. He is not sure how these happened. The right heel appears to be caused by pressure. He has been using Hydrofera Blue to the original wound on the left leg. 10-14-2022 upon evaluation today patient appears to be doing well currently in regard to his original wound but unfortunately he has several new wounds which I was completely unaware of before walking into the room to see him today.  Unfortunately he seems to not be doing nearly as well as what I saw when I last had an appointment with him 3 weeks ago. Fortunately I do not see any signs of systemic infection though unfortunately locally he has definite infection of the left lower extremity. He is currently on doxycycline I am going to likely have to extend that today. 10-21-2022 upon evaluation today patient appears to be doing well currently in regard to his wounds. He seems to be tolerating the dressing changes without complication. Fortunately there does not appear to be any signs of active infection locally or systemically which is great news and in general I definitely feel like we are on the right track here. 10-30-2022 upon evaluation today patient still continues to not be doing too well at all with regard to his wounds. Unfortunately he is still having signs of infection as well which also has been concerned. Fortunately I do not see any signs of active infection locally nor systemically at this time. No fevers, chills, nausea, vomiting, or diarrhea. 11-06-2022 upon evaluation today patient  appears to be doing okay currently in regard to his wounds. All things considered with his blood flow not being nearly as good as what should I think that he is making pretty good progress here. Fortunately I do not see any signs of infection locally nor systemically which is great news. No fevers, chills, nausea, vomiting, or diarrhea. 11-13-2022 upon evaluation today patient appears to be doing poorly currently in regard to his wounds. Things seem to be getting a little bit worse not better. 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 Subsequently he is also going to need to have vascular intervention but we are unsure exactly when this is going on the undertaking. I would try to actually send a message to Dr. Hazle Quant and the vascular team  just to see what exactly the plan is going forward. The patient is in agreement with that plan I just think we need to have a plan especially in light of the fact the left leg seems to be getting a little bit worse at this point. 11-25-2022 upon evaluation today patient is currently now discharged from the hospital following his colostomy procedure. That did very well and he seems to be doing well. Fortunately there does not appear to be any signs of active infection locally or systemically which is great news. No fevers, chills, nausea, vomiting, or diarrhea. 12-04-2022 upon evaluation today patient appears to be doing actually decently well in regard to his wounds and actually very pleased with where we stand and I think that he is making some pretty good progress here. Fortunately I do not see any signs of active infection locally nor systemically which is great news. No fevers, chills, nausea, vomiting, or diarrhea. 12-12-2022 upon evaluation today patient appears to be doing well currently in regard to his wound. He has been tolerating the dressing changes without complication. The wounds all seem to be making some progress to a degree. He has appointment with vascular on the 23rd of this month. 5/24; the patient was scheduled for a left lower extremity angiogram yesterday but apparently they were overbooked and they wanted to admit him to hospital last night so they could do this early this morning but he refused I see that Dr. Wyn Quaker has rescheduled him for June 6. He is using silver alginate to wounds on his bilateral lower extremities. 01-02-2023 upon evaluation today patient appears to be doing poorly in regard to his wounds still. Subsequently he did have his angiogram but apparently there really was not much that was found that could be revascularized. The patient tells me that Dr. Wyn Quaker was going to contact me here in the office I have not heard from him as of yet and I may see about giving him a  call just to follow-up or possibly send a message through epic which could be an option as well. 01-09-2023 upon evaluation today patient appears to be doing poorly in regard to his legs he is extremely swollen. He has been sleeping in his chair he tells me for the past week. His bed is broken and they have ordered a new one which is supposedly supposed to be there today. With that being said he the way he needs to be not sleeping in his chair that is his wheelchair every day and through the night. This is causing his legs to be much more swollen and it seen  and how badly wounds are doing at this point. This has made things significantly worse. Fortunately I do not see any signs of infection right now unfortunately I do believe that the patient has a tremendous amount of drainage currently. 01-19-2023 upon evaluation today patient appears to be doing poorly in regard to his wounds of his legs his swelling is better but still not as good as what we would like to see. Fortunately I do not see any evidence of active infection locally nor systemically which is great news and in general I do believe that we are moving in the right direction here. Nonetheless it would be better if she was in the bed sleeping or not send up in his wheelchair although he seems to still be set up in the wheelchair at this time. He tells me that he is just not as comfortable in the bed as the wheelchair but unfortunately this is causing damage to his legs which I discussed with him today as well. Nonetheless I am not sure that he is going to be amendable to doing anything different. [address] [place], Chad [name] ([id number]) [id number] Page 13 of 15 Constitutional Well-nourished and well-hydrated in no acute distress. Vitals Time Taken: 1:12 PM, Height: 69 in, Weight: 150 lbs, BMI: 22.1, Temperature: 98.4 F, Pulse: 106 bpm, Respiratory Rate: 16 breaths/min, Blood Pressure: 139/79  mmHg. Respiratory normal breathing without difficulty. Psychiatric this patient is able to make decisions and demonstrates good insight into disease process. Alert and Oriented x 3. pleasant and cooperative. General Notes: Upon inspection patient's wound bed actually again at all locations showed signs of still not doing quite as well as we would like to have seen. Fortunately I do not see any evidence of active infection locally or systemically which is great news and in general I do think we are doing better as far as that is concerned. Nonetheless I do think that he is really in a situation where he will be doing a lot better if he would get in bed to sleep and not sit up in his wheelchair throughout the night. Integumentary (Hair, Skin) Wound #12 status is Open. Original cause of wound was Gradually Appeared. The date acquired was: 07/12/2019. The wound has been in treatment 58 weeks. The wound is located on the Left,Distal,Medial Ankle. The wound measures 2.8cm length x 1.2cm width x 0.2cm depth; 2.639cm^2 area and 0.528cm^3 volume. There is a medium amount of serosanguineous drainage noted. Wound #15 status is Open. Original cause of wound was Gradually Appeared. The date acquired was: 10/01/2022. The wound has been in treatment 14 weeks. The wound is located on the Left,Dorsal Foot. The wound measures 4cm length x 1.3cm width x 0.3cm depth; 4.084cm^2 area and 1.225cm^3 volume. There is a medium amount of serosanguineous drainage noted. Wound #16 status is Open. Original cause of wound was Gradually Appeared. The date acquired was: 09/26/2022. The wound has been in treatment 14 weeks. The wound is located on the Right Calcaneus. The wound measures 3cm length x 1.5cm width x 0.2cm depth; 3.534cm^2 area and 0.707cm^3 volume. There is a medium amount of serosanguineous drainage noted. Wound #17 status is Open. Original cause of wound was Pressure Injury. The date acquired was: 09/30/2022. The wound  has been in treatment 13 weeks. The wound is located on the Left,Anterior Lower Leg. The wound measures 3.5cm length x 2.7cm width x 0.3cm depth; 7.422cm^2 area and 2.227cm^3 volume. There is a medium amount of serosanguineous drainage noted. Wound #18 status is  Open. Original cause of wound was Pressure Injury. The date acquired was: 09/26/2022. The wound has been in treatment 13 weeks. The wound is located on the Right,Medial T Second. The wound measures 1.3cm length x 1.2cm width x 0.1cm depth; 1.225cm^2 area and 0.123cm^3 volume. oe There is a medium amount of serosanguineous drainage noted. Wound #19 status is Open. Original cause of wound was Gradually Appeared. The date acquired was: 11/10/2022. The wound has been in treatment 9 weeks. The wound is located on the Left,Posterior Lower Leg. The wound measures 3.5cm length x 2cm width x 0.2cm depth; 5.498cm^2 area and 1.1cm^3 volume. There is a medium amount of serous drainage noted. 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 Nurse Visit as needed Bathing/ Shower/ Hygiene: May shower with wound dressing protected with water repellent cover or cast protector. No tub bath. Anesthetic (Use 'Patient Medications' Section for Anesthetic Order Entry): Lidocaine applied to wound bed Edema Control - Lymphedema / Segmental Compressive Device / Other: Elevate legs to the level of the heart and pump ankles as often as possible Elevate leg(s) parallel to the floor when sitting. Other: - PATIENT MUST SLEEP IN A BED. Legs are swelling due to no  elevation of legs. Off-Loading: Heel suspension boot - when in bed Medications-Please add to medication list.: Other: - apply AandD ointment to leg, do not apply to wound WOUND #12: - Ankle Wound Laterality: Left, Medial, Distal Cleanser: Soap and Water 3 x Per Week/30 Days Discharge Instructions: Gently cleanse wound with antibacterial soap, rinse and pat dry prior to dressing wounds Cleanser: Wound Cleanser 3 x Per Week/30 Days Discharge Instructions: Wash your hands with soap and water. Remove old dressing, discard into plastic bag and place into trash. Cleanse the wound with Wound Cleanser prior to applying a clean dressing using gauze sponges, not tissues or cotton balls. Do not scrub or use excessive force. Pat dry using gauze sponges, not tissue or cotton balls. Prim Dressing: Silvercel Small 2x2 (in/in) 3 x Per Week/30 Days ary Discharge Instructions: Apply Silvercel Small 2x2 (in/in) as instructed BERNERD, TERHUNE ([id number]) [id number] Page 14 of 15 Secondary Dressing: Zetuvit Plus 4x8 (in/in) 3 x Per Week/30 Days Com pression Wrap: UNNA BOOT - Unna-Z Zinc and Calamine Boot, 4x10 (in/yd) 3 x Per Week/30 Days Discharge Instructions: Unna Paste, Kerlix and Coban from base of toes to three finger widths below bend in knee. WOUND #15: - Foot Wound Laterality: Dorsal, Left Cleanser: Soap and Water 3 x Per Week/30 Days Discharge Instructions: Gently cleanse wound with antibacterial soap, rinse and pat dry prior to dressing wounds Cleanser: Wound Cleanser 3 x Per Week/30 Days Discharge Instructions: Wash your hands with soap and water. Remove old dressing, discard into plastic bag and place into trash. Cleanse the wound with Wound Cleanser prior to applying a clean dressing using gauze sponges, not tissues or cotton balls. Do not scrub or use excessive force. Pat dry using gauze sponges, not tissue or cotton balls. Prim Dressing: Silvercel Small 2x2 (in/in) 3  x Per Week/30 Days ary Discharge Instructions: Apply Silvercel Small 2x2 (in/in) as instructed Secondary Dressing: Zetuvit Plus 4x8 (in/in) 3 x Per Week/30 Days Com pression  Wrap: UNNA BOOT - Unna-Z Zinc and Calamine Boot, 4x10 (in/yd) 3 x Per Week/30 Days Discharge Instructions: Unna Paste, Kerlix and Coban from base of toes to three finger widths below bend in knee. WOUND #16: - Calcaneus Wound Laterality: Right Cleanser: Soap and Water 3 x Per Week/30 Days Discharge Instructions: Gently cleanse wound with antibacterial soap, rinse and pat dry prior to dressing wounds Cleanser: Wound Cleanser 3 x Per Week/30 Days Discharge Instructions: Wash your hands with soap and water. Remove old dressing, discard into plastic bag and place into trash. Cleanse the wound with Wound Cleanser prior to applying a clean dressing using gauze sponges, not tissues or cotton balls. Do not scrub or use excessive force. Pat dry using gauze sponges, not tissue or cotton balls. Prim Dressing: Silvercel Small 2x2 (in/in) 3 x Per Week/30 Days ary Discharge Instructions: Apply Silvercel Small 2x2 (in/in) as instructed Secondary Dressing: Zetuvit Plus 4x8 (in/in) 3 x Per Week/30 Days Com pression Wrap: UNNA BOOT - Unna-Z Zinc and Calamine Boot, 4x10 (in/yd) 3 x Per Week/30 Days Discharge Instructions: Unna Paste, Kerlix and Coban from base of toes to three finger widths below bend in knee. WOUND #17: - Lower Leg Wound Laterality: Left, Anterior Cleanser: Soap and Water 3 x Per Week/30 Days Discharge Instructions: Gently cleanse wound with antibacterial soap, rinse and pat dry prior to dressing wounds Cleanser: Wound Cleanser 3 x Per Week/30 Days Discharge Instructions: Wash your hands with soap and water. Remove old dressing, discard into plastic bag and place into trash. Cleanse the wound with Wound Cleanser prior to applying a clean dressing using gauze sponges, not tissues or cotton balls. Do not scrub or use  excessive force. Pat dry using gauze sponges, not tissue or cotton balls. Prim Dressing: Silvercel Small 2x2 (in/in) 3 x Per Week/30 Days ary Discharge Instructions: Apply Silvercel Small 2x2 (in/in) as instructed Secondary Dressing: Zetuvit Plus 4x8 (in/in) 3 x Per Week/30 Days Com pression Wrap: UNNA BOOT - Unna-Z Zinc and Calamine Boot, 4x10 (in/yd) 3 x Per Week/30 Days Discharge Instructions: Unna Paste, Kerlix and Coban from base of toes to three finger widths below bend in knee. WOUND #18: - T Second Wound Laterality: Right, Medial oe Cleanser: Soap and Water Every Other Day/30 Days Discharge Instructions: Gently cleanse wound with antibacterial soap, rinse and pat dry prior to dressing wounds Cleanser: Wound Cleanser Every Other Day/30 Days Discharge Instructions: Wash your hands with soap and water. Remove old dressing, discard into plastic bag and place into trash. Cleanse the wound with Wound Cleanser prior to applying a clean dressing using gauze sponges, not tissues or cotton balls. Do not scrub or use excessive force. Pat dry using gauze sponges, not tissue or cotton balls. Prim Dressing: Silvercel Small 2x2 (in/in) Every Other Day/30 Days ary Discharge Instructions: Apply Silvercel Small 2x2 (in/in) as instructed Secondary Dressing: Coverlet Latex-Free Fabric Adhesive Dressings (Generic) Every Other Day/30 Days Discharge Instructions: Knuckle WOUND #19: - Lower Leg Wound Laterality: Left, Posterior Cleanser: Soap and Water 3 x Per Week/30 Days Discharge Instructions: Gently cleanse wound with antibacterial soap, rinse and pat dry prior to dressing wounds Cleanser: Wound Cleanser 3 x Per Week/30 Days Discharge Instructions: Wash your hands with soap and water. Remove old dressing, discard into plastic bag and place into trash. Cleanse the wound with Wound Cleanser prior to applying a clean dressing using gauze sponges, not tissues or cotton balls. Do not scrub or use excessive  force. Pat dry using gauze sponges, not tissue  or cotton balls. Prim Dressing: Silvercel Small 2x2 (in/in) 3 x Per Week/30 Days ary Discharge Instructions: Apply Silvercel Small 2x2 (in/in) as instructed Secondary Dressing: Zetuvit Plus 4x8 (in/in) 3 x Per Week/30 Days Com pression Wrap: UNNA BOOT - Unna-Z Zinc and Calamine Boot, 4x10 (in/yd) 3 x Per Week/30 Days Discharge Instructions: Unna Paste, Kerlix and Coban from base of toes to three finger widths below bend in knee. 1. Based on what I am seeing I do believe that the patient would benefit from the ongoing use of the silver alginate. #2 I also believe the patient would do well when it comes to the [employer] boot wrap. This should be changed however at the facility and he does not quite think that is been done up to this point. 3. I am also can recommend that the patient needs to get back into his bed. I had a long discussion with him today regarding this and it sounds like he is being somewhat resistant to doing so nonetheless I think it is absolutely crucial that he did not. We will see patient back for reevaluation in 1 week here in the clinic. If anything worsens or changes patient will contact our office for additional recommendations. Electronic Signature(s) Signed: 01/19/2023 2:18:12 PM By: [name] PA-C Entered By: [name] on 01/19/2023 14:18:12 [name] ([id number]) [id number] Page 15 of 15 -------------------------------------------------------------------------------- SuperBill Details Patient Name: Date of Service: ZAHARI, XIANG 01/19/2023 Medical Record Number: [id number] Patient Account Number: [id number] Date of Birth/Sex: Treating RN: 1960/08/18 (62 y.o. Chad [name] Primary Care Provider: [name] Other Clinician: Referring Provider: Treating Provider/Extender: [name] Weeks in Treatment: 58 Diagnosis Coding ICD-10 Codes Code Description [phone]  Chronic venous hypertension (idiopathic) with ulcer and inflammation of left lower extremity 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 side 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, stage 3 Facility Procedures : CPT4: Code [id number] Description: 81 BILATERAL: Application of multi-layer venous compression system; leg (below knee), including ankle and t. Modifier: Quantity: 1 Physician Procedures : CPT4 Code Description Modifier [id number] - WC PHYS LEVEL 3 - EST PT ICD-10 Diagnosis Description 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 (idiopathic) with ulcer and inflammation of left lower extremity L97.322 Non-pressure chronic ulcer of left ankle  with fat layer exposed L98.492 Non-pressure chronic ulcer of skin of other sites with fat layer exposed 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 side Quantity: 1 Electronic Signature(s) Signed: 01/19/2023 5:22:58 PM By: Midge Aver MSN RN CNS WTA Signed: 01/19/2023 5:56:33 PM By: [name] PA-C Previous Signature: 01/19/2023 2:18:29 PM Version By: [name] PA-C Entered By: Midge Aver on 01/19/2023 14:38:41

## 2023-01-19 NOTE — Progress Notes (Signed)
Chad [name], Chad [name] ([id number]) [id number] Page 1 of 21 Visit Report for 01/19/2023 Arrival Information Details Patient Name: Date of Service: Chad [name], Chad [name] 01/19/2023 1:15 PM Medical Record Number: [id number] Patient Account Number: [id number] Date of Birth/Sex: Treating RN: [birth year] (62 y.o. [name] Primary Care Mulan Adan: [name] Other Clinician: Referring Piper Hassebrock: Treating Lakeith Careaga/Extender: [name] in Treatment: 48 Visit Information History Since Last Visit Added or deleted any medications: No Patient Arrived: Wheel Chair Has Dressing in Place as Prescribed: Yes Arrival Time: 13:04 Pain Present Now: No Accompanied By: self Transfer Assistance: EasyPivot Patient Lift Patient Identification Verified: Yes Secondary Verification Process Completed: Yes Patient Requires Transmission-Based No Precautions: Patient Has Alerts: Yes Patient Alerts: Patient on Blood Thinner NOT diabetic aspirin 81mg  Lives [facility] ABI R 0.81 11/05/22 ABI L 0.59 11/05/22 Electronic Signature(s) Signed: 01/19/2023 5:22:58 PM By: Midge Aver MSN RN CNS WTA Entered By: Midge Aver on 01/19/2023 13:11:39 -------------------------------------------------------------------------------- Clinic Level of Care Assessment Details Patient Name: Date of Service: Chad [name], Chad [name] 01/19/2023 1:15 PM Medical Record Number: [id number] Patient Account Number: [id number] Date of Birth/Sex: Treating RN: Aug 04, 1960 (62 y.o. [name] Primary Care Yaron Grasse: [name] Other Clinician: Referring Selin Eisler: Treating Brisa Auth/Extender: [name] Weeks in Treatment: 58 Clinic Level of Care Assessment Items TOOL 4 Quantity Score X- 1 0 Use when only an EandM is performed on FOLLOW-UP visit ASSESSMENTS - Nursing Assessment / Reassessment X- 1 10 Reassessment of Co-morbidities (includes updates in patient status) CHRISTOS, MIXSON ([id number]) [id number] Page 2 of 21 X- 1 5 Reassessment of Adherence to Treatment Plan ASSESSMENTS - Wound and Skin A ssessment / Reassessment []  - 0 Simple Wound Assessment / Reassessment - one wound X- 6 5 Complex Wound Assessment / Reassessment - multiple wounds []  - 0 Dermatologic / Skin Assessment (not related to wound area) ASSESSMENTS - Focused Assessment []  - 0 Circumferential Edema Measurements - multi extremities []  - 0 Nutritional Assessment / Counseling / Intervention []  - 0 Lower Extremity Assessment (monofilament, tuning fork, pulses) []  - 0 Peripheral Arterial Disease Assessment (using hand held doppler) ASSESSMENTS - Ostomy and/or Continence Assessment and Care []  - 0 Incontinence Assessment and Management []  - 0 Ostomy Care Assessment and Management (repouching, etc.) PROCESS - Coordination of Care []  - 0 Simple Patient / Family Education for ongoing care X- 1 20 Complex (extensive) Patient / Family Education for ongoing care X- 1 10 Staff obtains Chiropractor, Records, T Results / Process Orders est []  - 0 Staff telephones HHA, Nursing Homes / Clarify orders / etc []  - 0 Routine Transfer to another Facility (non-emergent condition) []  - 0 Routine Hospital Admission (non-emergent condition) []  - 0 New Admissions / Manufacturing engineer / Ordering NPWT Apligraf, etc. , []  - 0 Emergency Hospital Admission (emergent condition) []  - 0 Simple Discharge Coordination X- 1 15 Complex (extensive) Discharge Coordination PROCESS - Special Needs []  - 0 Pediatric / Minor Patient Management []  - 0 Isolation Patient Management []  - 0 Hearing / Language / Visual special needs []  - 0 Assessment of Community assistance (transportation, D/C planning, etc.) []  - 0 Additional assistance / Altered mentation []  - 0 Support Surface(s) Assessment (bed, cushion, seat, etc.) INTERVENTIONS - Wound Cleansing / Measurement []  -  0 Simple Wound Cleansing - one wound X- 6 5 Complex Wound Cleansing - multiple wounds X- 1 5 Wound Imaging (photographs - any number of wounds) []  - 0 Wound Tracing (instead of photographs) []  - 0 Simple Wound  Measurement - one wound X- 6 5 Complex Wound Measurement - multiple wounds INTERVENTIONS - Wound Dressings []  - 0 Small Wound Dressing one or multiple wounds X- 6 15 Medium Wound Dressing one or multiple wounds []  - 0 Large Wound Dressing one or multiple wounds []  - 0 Application of Medications - topical []  - 0 Application of Medications - injection [name] ([id number]) [id number] Page 3 of 21 INTERVENTIONS - Miscellaneous []  - 0 External ear exam []  - 0 Specimen Collection (cultures, biopsies, blood, body fluids, etc.) []  - 0 Specimen(s) / Culture(s) sent or taken to Lab for analysis []  - 0 Patient Transfer (multiple staff / Michiel Sites Lift / Similar devices) []  - 0 Simple Staple / Suture removal (25 or less) []  - 0 Complex Staple / Suture removal (26 or more) []  - 0 Hypo / Hyperglycemic Management (close monitor of Blood Glucose) []  - 0 Ankle / Brachial Index (ABI) - do not check if billed separately X- 1 5 Vital Signs Has the patient been seen at the hospital within the last three years: Yes Total Score: 250 Level Of Care: New/Established - Level 5 Electronic Signature(s) Signed: 01/19/2023 5:22:58 PM By: Midge Aver MSN RN CNS WTA Entered By: Midge Aver on 01/19/2023 14:38:22 -------------------------------------------------------------------------------- Compression Therapy Details Patient Name: Date of Service: Chad [name], Chad [name] 01/19/2023 1:15 PM Medical Record Number: [id number] Patient Account Number: [id number] Date of Birth/Sex: Treating RN: May 20, 1961 (62 y.o. [name] Primary Care Trenace Coughlin: [name] Other Clinician: Referring Elwanda Moger: Treating Esperanza Madrazo/Extender: [name] Weeks in  Treatment: 46 Compression Therapy Performed for Wound Assessment: Wound #12 Left,Distal,Medial Ankle Performed By: Clinician Midge Aver, RN Compression Type: Henriette Combs Post Procedure Diagnosis Same as Pre-procedure Electronic Signature(s) Signed: 01/19/2023 5:22:58 PM By: Midge Aver MSN RN CNS WTA Entered By: Midge Aver on 01/19/2023 14:37:53 -------------------------------------------------------------------------------- Compression Therapy Details Patient Name: Date of Service: Chad [name] 01/19/2023 1:15 PM [name] ([id number]) [id number] Page 4 of 21 Medical Record Number: [id number] Patient Account Number: [id number] Date of Birth/Sex: Treating RN: 1960-09-01 (62 y.o. [name] Primary Care Sinclair Arrazola: [name] Other Clinician: Referring Ludie Pavlik: Treating Treyshaun Keatts/Extender: [name] Weeks in Treatment: 58 Compression Therapy Performed for Wound Assessment: Wound #15 Left,Dorsal Foot Performed By: Clinician Midge Aver, RN Compression Type: Henriette Combs Post Procedure Diagnosis Same as Pre-procedure Electronic Signature(s) Signed: 01/19/2023 5:22:58 PM By: Midge Aver MSN RN CNS WTA Entered By: Midge Aver on 01/19/2023 14:37:54 -------------------------------------------------------------------------------- Compression Therapy Details Patient Name: Date of Service: Chad [name], Chad [name] 01/19/2023 1:15 PM Medical Record Number: [id number] Patient Account Number: [id number] Date of Birth/Sex: Treating RN: [birth year] (62 y.o. [name] Primary Care Shirline Kendle: [name] Other Clinician: Referring Aquanetta Schwarz: Treating Brandon Scarbrough/Extender: [name] Weeks in Treatment: 35 Compression Therapy Performed for Wound Assessment: Wound #16 Right Calcaneus Performed By: Clinician Midge Aver, RN Compression Type: Henriette Combs Post Procedure Diagnosis Same as Pre-procedure Electronic  Signature(s) Signed: 01/19/2023 5:22:58 PM By: Midge Aver MSN RN CNS WTA Entered By: Midge Aver on 01/19/2023 14:37:54 -------------------------------------------------------------------------------- Compression Therapy Details Patient Name: Date of Service: Chad [name], Chad [name] 01/19/2023 1:15 PM Medical Record Number: [id number] Patient Account Number: [id number] Date of Birth/Sex: Treating RN: Jul 01, 1961 (62 y.o. [name] Primary Care Kaden Dunkel: [name] Other Clinician: Referring Jennah Satchell: Treating Undrea Shipes/Extender: [name] Weeks in Treatment: 76 Compression Therapy Performed for Wound Assessment: Wound #17 Left,Anterior Lower Leg Performed By: Raj Janus, RN Chester, [name] ([id number]) [id number] Page 5 of 21  Compression Type: Henriette Combs Post Procedure Diagnosis Same as Architectural technologist) Signed: 01/19/2023 5:22:58 PM By: Midge Aver MSN RN CNS WTA Entered By: Midge Aver on 01/19/2023 14:37:54 -------------------------------------------------------------------------------- Compression Therapy Details Patient Name: Date of Service: Chad [name], Chad [name] 01/19/2023 1:15 PM Medical Record Number: [id number] Patient Account Number: [id number] Date of Birth/Sex: Treating RN: Dec 08, 1960 (63 y.o. [name] Primary Care Kadee Philyaw: [name] Other Clinician: Referring Cinsere Mizrahi: Treating Kylyn Sookram/Extender: [name] Weeks in Treatment: 58 Compression Therapy Performed for Wound Assessment: Wound #18 Right,Medial T Second oe Performed By: Raj Janus, RN Compression Type: Henriette Combs Post Procedure Diagnosis Same as Pre-procedure Electronic Signature(s) Signed: 01/19/2023 5:22:58 PM By: Midge Aver MSN RN CNS WTA Entered By: Midge Aver on 01/19/2023 14:37:54 -------------------------------------------------------------------------------- Compression Therapy  Details Patient Name: Date of Service: Chad [name], Chad [name] 01/19/2023 1:15 PM Medical Record Number: [id number] Patient Account Number: [id number] Date of Birth/Sex: Treating RN: Jun 30, 1961 (62 y.o. [name] Primary Care Sheretha Shadd: [name] Other Clinician: Referring Danniella Robben: Treating Jady Braggs/Extender: [name] Weeks in Treatment: 58 Compression Therapy Performed for Wound Assessment: Wound #19 Left,Posterior Lower Leg Performed By: Clinician Midge Aver, RN Compression Type: Henriette Combs Post Procedure Diagnosis Same as Pre-procedure Electronic Signature(s) [name] ([id number]) [id number] Page 6 of 21 Signed: 01/19/2023 5:22:58 PM By: Midge Aver MSN RN CNS WTA Entered By: Midge Aver on 01/19/2023 14:37:54 -------------------------------------------------------------------------------- Encounter Discharge Information Details Patient Name: Date of Service: Chad [name], Chad [name] 01/19/2023 1:15 PM Medical Record Number: [id number] Patient Account Number: [id number] Date of Birth/Sex: Treating RN: 05-29-1961 (62 y.o. [name] Primary Care Aeden Matranga: [name] Other Clinician: Referring Mearl Olver: Treating Davon Folta/Extender: [name] in Treatment: 18 Encounter Discharge Information Items Discharge Condition: Stable Ambulatory Status: Wheelchair Discharge Destination: Skilled Nursing Facility Telephoned: No Orders Sent: Yes Transportation: Other Accompanied By: self Schedule Follow-up Appointment: Yes Clinical Summary of Care: Electronic Signature(s) Signed: 01/19/2023 5:22:58 PM By: Midge Aver MSN RN CNS WTA Entered By: Midge Aver on 01/19/2023 14:39:40 -------------------------------------------------------------------------------- Lower Extremity Assessment Details Patient Name: Date of Service: Chad [name], Chad [name] 01/19/2023 1:15 PM Medical Record Number: [id number] Patient Account  Number: [id number] Date of Birth/Sex: Treating RN: 1960-11-03 (62 y.o. [name] Primary Care Gazelle Towe: [name] Other Clinician: Referring Ilian Wessell: Treating Sirius Woodford/Extender: [name] Weeks in Treatment: 58 Edema Assessment Assessed: Kyra Searles: Yes] Franne Forts: Yes] [Left: Edema] [Right: :] Calf Left: Right: Point of Measurement: 33 cm From Medial Instep 36 cm 35 cm Ankle Machi, [name] ([id number]) [id number] Page 7 of 21 Left: Right: Point of Measurement: 12 cm From Medial Instep 23 cm 22.4 cm Vascular Assessment Pulses: Dorsalis Pedis Palpable: [Left:Yes] [Right:Yes] Electronic Signature(s) Signed: 01/19/2023 5:22:58 PM By: Midge Aver MSN RN CNS WTA Entered By: Midge Aver on 01/19/2023 13:35:03 -------------------------------------------------------------------------------- Multi Wound Chart Details Patient Name: Date of Service: Chad [name], Chad [name] 01/19/2023 1:15 PM Medical Record Number: [id number] Patient Account Number: [id number] Date of Birth/Sex: Treating RN: 1960/08/24 (62 y.o. [name] Primary Care Drucella Karbowski: [name] Other Clinician: Referring Gurveer Colucci: Treating Andee Chivers/Extender: [name] Weeks in Treatment: 92 Vital Signs Height(in): 69 Pulse(bpm): 106 Weight(lbs): 150 Blood Pressure(mmHg): 139/79 Body Mass Index(BMI): 22.1 Temperature(F): 98.4 Respiratory Rate(breaths/min): 16 [12:Photos:] Left, Distal, Medial Ankle Left, Dorsal Foot Right Calcaneus Wound Location: Gradually Appeared Gradually Appeared Gradually Appeared Wounding Event: Venous Leg Ulcer Venous Leg Ulcer Pressure Ulcer Primary Etiology: Anemia, Chronic Obstructive Anemia, Chronic Obstructive Anemia, Chronic Obstructive Comorbid History: Pulmonary Disease (COPD), Coronary Pulmonary Disease (  COPD), Coronary Pulmonary Disease (COPD), Coronary Artery Disease, Peripheral Arterial Artery Disease, Peripheral  Arterial Artery Disease, Peripheral Arterial Disease, Peripheral Venous Disease, Disease, Peripheral Venous Disease, Disease, Peripheral Venous Disease, Hepatitis C, Osteoarthritis, Hepatitis C, Osteoarthritis, Hepatitis C, Osteoarthritis, Neuropathy, Received Chemotherapy Neuropathy, Received Chemotherapy Neuropathy, Received Chemotherapy 07/12/2019 10/01/2022 09/26/2022 Date Acquired: 58 14 14 Weeks of Treatment: Open Open Open Wound Status: No No No Wound Recurrence: No Yes Yes Clustered Wound: 2.8x1.2x0.2 4x1.3x0.3 3x1.5x0.2 Measurements L x W x D (cm) 2.639 4.084 3.534 A (cm) : rea 0.528 1.225 0.707 Volume (cm) : 61.80% -73.30% -275.20% % Reduction in Area: 61.80% -419.10% -276.10% % Reduction in Volume: Full Thickness Without Exposed Full Thickness Without Exposed Category/Stage III Classification: Support Structures Support Structures Medium Medium Medium Exudate Amount: [name] ([id number]) [id number] Page 8 of 21 Serosanguineous Serosanguineous Serosanguineous Exudate Type: red, brown red, brown red, brown Exudate Color: Wound Number: 17 18 19  Photos: Left, Anterior Lower Leg Right, Medial T Second oe Left, Posterior Lower Leg Wound Location: Pressure Injury Pressure Injury Gradually Appeared Wounding Event: Pressure Ulcer Pressure Ulcer Venous Leg Ulcer Primary Etiology: Anemia, Chronic Obstructive Anemia, Chronic Obstructive Anemia, Chronic Obstructive Comorbid History: Pulmonary Disease (COPD), Coronary Pulmonary Disease (COPD), Coronary Pulmonary Disease (COPD), Coronary Artery Disease, Peripheral Arterial Artery Disease, Peripheral Arterial Artery Disease, Peripheral Arterial Disease, Peripheral Venous Disease, Disease, Peripheral Venous Disease, Disease, Peripheral Venous Disease, Hepatitis C, Osteoarthritis, Hepatitis C, Osteoarthritis, Hepatitis C, Osteoarthritis, Neuropathy, Received Chemotherapy Neuropathy, Received  Chemotherapy Neuropathy, Received Chemotherapy 09/30/2022 09/26/2022 11/10/2022 Date Acquired: 13 13 9  Weeks of Treatment: Open Open Open Wound Status: No No No Wound Recurrence: No No Yes Clustered Wound: 3.5x2.7x0.3 1.3x1.2x0.1 3.5x2x0.2 Measurements L x W x D (cm) 7.422 1.225 5.498 A (cm) : rea 2.227 0.123 1.1 Volume (cm) : -80.00% -122.70% -677.70% % Reduction in Area: -440.50% -123.60% -1449.30% % Reduction in Volume: Category/Stage II Category/Stage II Full Thickness Without Exposed Classification: Support Structures Medium Medium Medium Exudate Amount: Serosanguineous Serosanguineous Serous Exudate Type: red, brown red, brown amber Exudate Color: Treatment Notes Wound #12 (Ankle) Wound Laterality: Left, Medial, Distal Cleanser Soap and Water Discharge Instruction: Gently cleanse wound with antibacterial soap, rinse and pat dry prior to dressing wounds Wound Cleanser Discharge Instruction: Wash your hands with soap and water. Remove old dressing, discard into plastic bag and place into trash. Cleanse the wound with Wound Cleanser prior to applying a clean dressing using gauze sponges, not tissues or cotton balls. Do not scrub or use excessive force. Pat dry using gauze sponges, not tissue or cotton balls. Peri-Wound Care Topical Primary Dressing Silvercel Small 2x2 (in/in) Discharge Instruction: Apply Silvercel Small 2x2 (in/in) as instructed Secondary Dressing Zetuvit Plus 4x8 (in/in) Secured With Compression Wrap UNNA BOOT - Unna-Z Zinc and Calamine Boot, 4x10 (in/yd) Discharge Instruction: Unna Paste, Kerlix and Coban from base of toes to three finger widths below bend in knee. Compression Stockings Add-Ons Wound #15 (Foot) Wound Laterality: Dorsal, Left Cleanser Soap and Water Discharge Instruction: Gently cleanse wound with antibacterial soap, rinse and pat dry prior to dressing wounds Wound Cleanser Discharge Instruction: Wash your hands with soap and  water. Remove old dressing, discard into plastic bag and place into trash. Cleanse the wound with Wound Cleanser prior to applying a clean dressing using gauze sponges, not tissues or cotton balls. Do not scrub or use excessive force. Pat dry using gauze sponges, not tissue or cotton balls. KEVONTE, VANECEK ([id number]) [id number] Page 9 of 21 Peri-Wound Care Topical Primary Dressing Silvercel Small 2x2 (  in/in) Discharge Instruction: Apply Silvercel Small 2x2 (in/in) as instructed Secondary Dressing Zetuvit Plus 4x8 (in/in) Secured With Compression Wrap UNNA BOOT - Unna-Z Zinc and Calamine Boot, 4x10 (in/yd) Discharge Instruction: Unna Paste, Kerlix and Coban from base of toes to three finger widths below bend in knee. Compression Stockings Add-Ons Wound #16 (Calcaneus) Wound Laterality: Right Cleanser Soap and Water Discharge Instruction: Gently cleanse wound with antibacterial soap, rinse and pat dry prior to dressing wounds Wound Cleanser Discharge Instruction: Wash your hands with soap and water. Remove old dressing, discard into plastic bag and place into trash. Cleanse the wound with Wound Cleanser prior to applying a clean dressing using gauze sponges, not tissues or cotton balls. Do not scrub or use excessive force. Pat dry using gauze sponges, not tissue or cotton balls. Peri-Wound Care Topical Primary Dressing Silvercel Small 2x2 (in/in) Discharge Instruction: Apply Silvercel Small 2x2 (in/in) as instructed Secondary Dressing Zetuvit Plus 4x8 (in/in) Secured With Compression Wrap UNNA BOOT - Unna-Z Zinc and Calamine Boot, 4x10 (in/yd) Discharge Instruction: Unna Paste, Kerlix and Coban from base of toes to three finger widths below bend in knee. Compression Stockings Add-Ons Wound #17 (Lower Leg) Wound Laterality: Left, Anterior Cleanser Soap and Water Discharge Instruction: Gently cleanse wound with antibacterial soap, rinse and pat dry  prior to dressing wounds Wound Cleanser Discharge Instruction: Wash your hands with soap and water. Remove old dressing, discard into plastic bag and place into trash. Cleanse the wound with Wound Cleanser prior to applying a clean dressing using gauze sponges, not tissues or cotton balls. Do not scrub or use excessive force. Pat dry using gauze sponges, not tissue or cotton balls. Peri-Wound Care Topical Primary Dressing Silvercel Small 2x2 (in/in) Discharge Instruction: Apply Silvercel Small 2x2 (in/in) as instructed Secondary Dressing Zetuvit Plus 4x8 (in/in) Secured With Compression Wrap UNNA BOOT - Unna-Z Zinc and Calamine Boot, 4x10 (in/yd) Discharge Instruction: Unna Paste, Kerlix and Coban from base of toes to three finger widths below bend in knee. NIRAJ, KUDRNA ([id number]) [id number] Page 10 of 21 Compression Stockings Add-Ons Wound #18 (Toe Second) Wound Laterality: Right, Medial Cleanser Soap and Water Discharge Instruction: Gently cleanse wound with antibacterial soap, rinse and pat dry prior to dressing wounds Wound Cleanser Discharge Instruction: Wash your hands with soap and water. Remove old dressing, discard into plastic bag and place into trash. Cleanse the wound with Wound Cleanser prior to applying a clean dressing using gauze sponges, not tissues or cotton balls. Do not scrub or use excessive force. Pat dry using gauze sponges, not tissue or cotton balls. Peri-Wound Care Topical Primary Dressing Silvercel Small 2x2 (in/in) Discharge Instruction: Apply Silvercel Small 2x2 (in/in) as instructed Secondary Dressing Coverlet Latex-Free Fabric Adhesive Dressings Discharge Instruction: Knuckle Secured With Compression Wrap Compression Stockings Add-Ons Wound #19 (Lower Leg) Wound Laterality: Left, Posterior Cleanser Soap and Water Discharge Instruction: Gently cleanse wound with antibacterial soap, rinse and pat dry prior to  dressing wounds Wound Cleanser Discharge Instruction: Wash your hands with soap and water. Remove old dressing, discard into plastic bag and place into trash. Cleanse the wound with Wound Cleanser prior to applying a clean dressing using gauze sponges, not tissues or cotton balls. Do not scrub or use excessive force. Pat dry using gauze sponges, not tissue or cotton balls. Peri-Wound Care Topical Primary Dressing Silvercel Small 2x2 (in/in) Discharge Instruction: Apply Silvercel Small 2x2 (in/in) as instructed Secondary Dressing Zetuvit Plus 4x8 (in/in) Secured With Compression Wrap UNNA BOOT - Unna-Z Zinc and Calamine Boot, 4x10 (  in/yd) Discharge Instruction: Melony Overly, Kerlix and Coban from base of toes to three finger widths below bend in knee. Compression Stockings Add-Ons Electronic Signature(s) Signed: 01/19/2023 5:22:58 PM By: Midge Aver MSN RN CNS WTA Entered By: Midge Aver on 01/19/2023 13:58:34 [name] ([id number]) [id number] Page 11 of 21 -------------------------------------------------------------------------------- Multi-Disciplinary Care Plan Details Patient Name: Date of Service: Chad [name], GINTZ 01/19/2023 1:15 PM Medical Record Number: [id number] Patient Account Number: [id number] Date of Birth/Sex: Treating RN: 1960-12-09 (62 y.o. [name] Primary Care Ahliya Glatt: [name] Other Clinician: Referring Melessa Cowell: Treating Breyonna Nault/Extender: [name] Weeks in Treatment: 28 Active Inactive Venous Leg Ulcer Nursing Diagnoses: Knowledge deficit related to disease process and management Goals: Patient will maintain optimal edema control Date Initiated: 12/06/2021 Target Resolution Date: 02/02/2022 Goal Status: Active Patient/caregiver will verbalize understanding of disease process and disease management Date Initiated: 12/06/2021 Date Inactivated: 12/19/2022 Target Resolution Date: 11/27/2022 Goal  Status: Met Interventions: Assess peripheral edema status every visit. Compression as ordered Notes: Electronic Signature(s) Signed: 01/19/2023 5:22:58 PM By: Midge Aver MSN RN CNS WTA Entered By: Midge Aver on 01/19/2023 13:57:09 -------------------------------------------------------------------------------- Pain Assessment Details Patient Name: Date of Service: ALASTOR, KNEALE 01/19/2023 1:15 PM Medical Record Number: [id number] Patient Account Number: [id number] Date of Birth/Sex: Treating RN: [birth year] (62 y.o. [name] Primary Care Jhene Westmoreland: [name] Other Clinician: Referring Kennedy Brines: Treating Makaela Cando/Extender: [name] Weeks in Treatment: 37 Active Problems Location of Pain Severity and Description of Pain Patient Has Paino No Site Locations [place], [place] ([id number]) [id number] Page 12 of 21 Pain Management and Medication Current Pain Management: Electronic Signature(s) Signed: 01/19/2023 5:22:58 PM By: Midge Aver MSN RN CNS WTA Entered By: Midge Aver on 01/19/2023 13:12:50 -------------------------------------------------------------------------------- Patient/Caregiver Education Details Patient Name: Date of Service: Denna Haggard 6/24/2024andnbsp1:15 PM Medical Record Number: [id number] Patient Account Number: [id number] Date of Birth/Gender: Treating RN: [birth year] (62 y.o. [name] Primary Care Physician: [name] Other Clinician: Referring Physician: Treating Physician/Extender: [name] in Treatment: 54 Education Assessment Education Provided To: Patient Education Topics Provided Wound/Skin Impairment: Handouts: Caring for Your Ulcer Methods: Explain/Verbal Responses: State content correctly Electronic Signature(s) Signed: 01/19/2023 5:22:58 PM By: Midge Aver MSN RN CNS WTA Entered By: Midge Aver on 01/19/2023 13:57:22 [name]  ([id number]) [id number] Page 13 of 21 -------------------------------------------------------------------------------- Wound Assessment Details Patient Name: Date of Service: KAIGE, WHISTLER 01/19/2023 1:15 PM Medical Record Number: [id number] Patient Account Number: [id number] Date of Birth/Sex: Treating RN: 1961-03-08 (62 y.o. [name] Primary Care Akira Perusse: [name] Other Clinician: Referring Zoi Devine: Treating Adaysha Dubinsky/Extender: [name] Weeks in Treatment: 58 Wound Status Wound Number: 12 Primary Venous Leg Ulcer Etiology: Wound Location: Left, Distal, Medial Ankle Wound Open Wounding Event: Gradually Appeared Status: Date Acquired: 07/12/2019 Comorbid Anemia, Chronic Obstructive Pulmonary Disease (COPD), Coronary Weeks Of Treatment: 58 History: Artery Disease, Peripheral Arterial Disease, Peripheral Venous Clustered Wound: No Disease, Hepatitis C, Osteoarthritis, Neuropathy, Received Chemotherapy Photos Wound Measurements Length: (cm) 2.8 Width: (cm) 1.2 Depth: (cm) 0.2 Area: (cm) 2.639 Volume: (cm) 0.528 % Reduction in Area: 61.8% % Reduction in Volume: 61.8% Wound Description Classification: Full Thickness Without Exposed Support Exudate Amount: Medium Exudate Type: Serosanguineous Exudate Color: red, brown Structures Treatment Notes Wound #12 (Ankle) Wound Laterality: Left, Medial, Distal Cleanser Soap and Water Discharge Instruction: Gently cleanse wound with antibacterial soap, rinse and pat dry prior to dressing wounds Wound Cleanser Discharge Instruction: Wash your hands with soap and water. Remove old dressing, discard  into plastic bag and place into trash. Cleanse the wound with Wound Cleanser prior to applying a clean dressing using gauze sponges, not tissues or cotton balls. Do not scrub or use excessive force. Pat dry using gauze sponges, not tissue or cotton balls. Peri-Wound  Care Topical Primary Dressing THEODORO, KOVAL ([id number]) [id number] Page 14 of 21 Silvercel Small 2x2 (in/in) Discharge Instruction: Apply Silvercel Small 2x2 (in/in) as instructed Secondary Dressing Zetuvit Plus 4x8 (in/in) Secured With Compression Wrap UNNA BOOT - Unna-Z Zinc and Calamine Boot, 4x10 (in/yd) Discharge Instruction: Unna Paste, Kerlix and Coban from base of toes to three finger widths below bend in knee. Compression Stockings Add-Ons Electronic Signature(s) Signed: 01/19/2023 5:22:58 PM By: Midge Aver MSN RN CNS WTA Entered By: Midge Aver on 01/19/2023 13:29:48 -------------------------------------------------------------------------------- Wound Assessment Details Patient Name: Date of Service: MARQUAVION, VENHUIZEN 01/19/2023 1:15 PM Medical Record Number: [id number] Patient Account Number: [id number] Date of Birth/Sex: Treating RN: 10-20-1960 (62 y.o. [name] Primary Care Prinston Kynard: [name] Other Clinician: Referring Klyn Kroening: Treating Ambrielle Kington/Extender: [name] Weeks in Treatment: 58 Wound Status Wound Number: 15 Primary Venous Leg Ulcer Etiology: Wound Location: Left, Dorsal Foot Wound Open Wounding Event: Gradually Appeared Status: Date Acquired: 10/01/2022 Comorbid Anemia, Chronic Obstructive Pulmonary Disease (COPD), Coronary Weeks Of Treatment: 14 History: Artery Disease, Peripheral Arterial Disease, Peripheral Venous Clustered Wound: Yes Disease, Hepatitis C, Osteoarthritis, Neuropathy, Received Chemotherapy Photos Wound Measurements Length: (cm) 4 Width: (cm) 1.3 Depth: (cm) 0.3 Area: (cm) 4.084 Volume: (cm) 1.225 % Reduction in Area: -73.3% % Reduction in Volume: -419.1% Wound Description Classification: Full Thickness Without Exposed Support Structures Ringel, Claudie ([id number]) Exudate Amount: Medium Exudate Type: Serosanguineous Exudate Color: red,  brown [id number] Page 15 of 21 Treatment Notes Wound #15 (Foot) Wound Laterality: Dorsal, Left Cleanser Soap and Water Discharge Instruction: Gently cleanse wound with antibacterial soap, rinse and pat dry prior to dressing wounds Wound Cleanser Discharge Instruction: Wash your hands with soap and water. Remove old dressing, discard into plastic bag and place into trash. Cleanse the wound with Wound Cleanser prior to applying a clean dressing using gauze sponges, not tissues or cotton balls. Do not scrub or use excessive force. Pat dry using gauze sponges, not tissue or cotton balls. Peri-Wound Care Topical Primary Dressing Silvercel Small 2x2 (in/in) Discharge Instruction: Apply Silvercel Small 2x2 (in/in) as instructed Secondary Dressing Zetuvit Plus 4x8 (in/in) Secured With Compression Wrap UNNA BOOT - Unna-Z Zinc and Calamine Boot, 4x10 (in/yd) Discharge Instruction: Unna Paste, Kerlix and Coban from base of toes to three finger widths below bend in knee. Compression Stockings Add-Ons Electronic Signature(s) Signed: 01/19/2023 5:22:58 PM By: Midge Aver MSN RN CNS WTA Entered By: Midge Aver on 01/19/2023 13:30:24 -------------------------------------------------------------------------------- Wound Assessment Details Patient Name: Date of Service: PURNELL, DAIGLE 01/19/2023 1:15 PM Medical Record Number: [id number] Patient Account Number: [id number] Date of Birth/Sex: Treating RN: 1961/02/05 (62 y.o. [name] Primary Care Tinika Bucknam: [name] Other Clinician: Referring Fay Swider: Treating Matther Labell/Extender: [name] Weeks in Treatment: 58 Wound Status Wound Number: 16 Primary Pressure Ulcer Etiology: Wound Location: Right Calcaneus Wound Open Wounding Event: Gradually Appeared Status: Date Acquired: 09/26/2022 Comorbid Anemia, Chronic Obstructive Pulmonary Disease (COPD), Coronary Weeks Of Treatment: 14 History:  Artery Disease, Peripheral Arterial Disease, Peripheral Venous Clustered Wound: Yes Disease, Hepatitis C, Osteoarthritis, Neuropathy, Received Chemotherapy Photos HART, HAAS ([id number]) [id number] Page 16 of 21 Wound Measurements Length: (cm) 3 Width: (cm) 1.5 Depth: (cm) 0.2 Area: (cm) 3.534 Volume: (cm) 0.707 % Reduction  in Area: -275.2% % Reduction in Volume: -276.1% Wound Description Classification: Exudate Amount: Exudate Type: Exudate Color: Category/Stage III Medium Serosanguineous red, brown Treatment Notes Wound #16 (Calcaneus) Wound Laterality: Right Cleanser Soap and Water Discharge Instruction: Gently cleanse wound with antibacterial soap, rinse and pat dry prior to dressing wounds Wound Cleanser Discharge Instruction: Wash your hands with soap and water. Remove old dressing, discard into plastic bag and place into trash. Cleanse the wound with Wound Cleanser prior to applying a clean dressing using gauze sponges, not tissues or cotton balls. Do not scrub or use excessive force. Pat dry using gauze sponges, not tissue or cotton balls. Peri-Wound Care Topical Primary Dressing Silvercel Small 2x2 (in/in) Discharge Instruction: Apply Silvercel Small 2x2 (in/in) as instructed Secondary Dressing Zetuvit Plus 4x8 (in/in) Secured With Compression Wrap UNNA BOOT - Unna-Z Zinc and Calamine Boot, 4x10 (in/yd) Discharge Instruction: Unna Paste, Kerlix and Coban from base of toes to three finger widths below bend in knee. Compression Stockings Add-Ons Electronic Signature(s) Signed: 01/19/2023 5:22:58 PM By: Midge Aver MSN RN CNS WTA Entered By: Midge Aver on 01/19/2023 13:30:47 [name] ([id number]) [id number] Page 17 of 21 -------------------------------------------------------------------------------- Wound Assessment Details Patient Name: Date of Service: KHANI, PAINO 01/19/2023 1:15  PM Medical Record Number: [id number] Patient Account Number: [id number] Date of Birth/Sex: Treating RN: 1960/11/11 (62 y.o. [name] Primary Care Jene Huq: [name] Other Clinician: Referring Yatzary Merriweather: Treating Modelle Vollmer/Extender: [name] Weeks in Treatment: 58 Wound Status Wound Number: 17 Primary Pressure Ulcer Etiology: Wound Location: Left, Anterior Lower Leg Wound Open Wounding Event: Pressure Injury Status: Date Acquired: 09/30/2022 Comorbid Anemia, Chronic Obstructive Pulmonary Disease (COPD), Coronary Weeks Of Treatment: 13 History: Artery Disease, Peripheral Arterial Disease, Peripheral Venous Clustered Wound: No Disease, Hepatitis C, Osteoarthritis, Neuropathy, Received Chemotherapy Photos Wound Measurements Length: (cm) 3.5 Width: (cm) 2.7 Depth: (cm) 0.3 Area: (cm) 7.422 Volume: (cm) 2.227 % Reduction in Area: -80% % Reduction in Volume: -440.5% Wound Description Classification: Category/Stage II Exudate Amount: Medium Exudate Type: Serosanguineous Exudate Color: red, brown Treatment Notes Wound #17 (Lower Leg) Wound Laterality: Left, Anterior Cleanser Soap and Water Discharge Instruction: Gently cleanse wound with antibacterial soap, rinse and pat dry prior to dressing wounds Wound Cleanser Discharge Instruction: Wash your hands with soap and water. Remove old dressing, discard into plastic bag and place into trash. Cleanse the wound with Wound Cleanser prior to applying a clean dressing using gauze sponges, not tissues or cotton balls. Do not scrub or use excessive force. Pat dry using gauze sponges, not tissue or cotton balls. Peri-Wound Care Topical Primary Dressing BROCK, LARMON ([id number]) [id number] Page 18 of 21 Silvercel Small 2x2 (in/in) Discharge Instruction: Apply Silvercel Small 2x2 (in/in) as instructed Secondary Dressing Zetuvit Plus 4x8 (in/in) Secured With Compression Wrap UNNA  BOOT - Unna-Z Zinc and Calamine Boot, 4x10 (in/yd) Discharge Instruction: Unna Paste, Kerlix and Coban from base of toes to three finger widths below bend in knee. Compression Stockings Add-Ons Electronic Signature(s) Signed: 01/19/2023 5:22:58 PM By: Midge Aver MSN RN CNS WTA Entered By: Midge Aver on 01/19/2023 13:31:08 -------------------------------------------------------------------------------- Wound Assessment Details Patient Name: Date of Service: BENIAH, MAGNAN 01/19/2023 1:15 PM Medical Record Number: [id number] Patient Account Number: [id number] Date of Birth/Sex: Treating RN: Mar 13, 1961 (62 y.o. [name] Primary Care Bronco Mcgrory: [name] Other Clinician: Referring Oneita Allmon: Treating Kenyette Gundy/Extender: [name] Weeks in Treatment: 58 Wound Status Wound Number: 18 Primary Pressure Ulcer Etiology: Wound Location: Right, Medial T Second oe Wound Open Wounding  Event: Pressure Injury Status: Date Acquired: 09/26/2022 Comorbid Anemia, Chronic Obstructive Pulmonary Disease (COPD), Coronary Weeks Of Treatment: 13 History: Artery Disease, Peripheral Arterial Disease, Peripheral Venous Clustered Wound: No Disease, Hepatitis C, Osteoarthritis, Neuropathy, Received Chemotherapy Photos Wound Measurements Length: (cm) 1.3 Width: (cm) 1.2 Depth: (cm) 0.1 Area: (cm) 1.225 Volume: (cm) 0.123 % Reduction in Area: -122.7% % Reduction in Volume: -123.6% Wound Description Classification: Category/Stage II Nannini, Danniel ([id number]) Exudate Amount: Medium Exudate Type: Serosanguineous Exudate Color: red, brown [id number] Page 19 of 21 Treatment Notes Wound #18 (Toe Second) Wound Laterality: Right, Medial Cleanser Soap and Water Discharge Instruction: Gently cleanse wound with antibacterial soap, rinse and pat dry prior to dressing wounds Wound Cleanser Discharge Instruction: Wash your hands with soap and water.  Remove old dressing, discard into plastic bag and place into trash. Cleanse the wound with Wound Cleanser prior to applying a clean dressing using gauze sponges, not tissues or cotton balls. Do not scrub or use excessive force. Pat dry using gauze sponges, not tissue or cotton balls. Peri-Wound Care Topical Primary Dressing Silvercel Small 2x2 (in/in) Discharge Instruction: Apply Silvercel Small 2x2 (in/in) as instructed Secondary Dressing Coverlet Latex-Free Fabric Adhesive Dressings Discharge Instruction: Knuckle Secured With Compression Wrap Compression Stockings Add-Ons Electronic Signature(s) Signed: 01/19/2023 5:22:58 PM By: Midge Aver MSN RN CNS WTA Entered By: Midge Aver on 01/19/2023 13:31:29 -------------------------------------------------------------------------------- Wound Assessment Details Patient Name: Date of Service: DUARD, SPIEWAK 01/19/2023 1:15 PM Medical Record Number: [id number] Patient Account Number: [id number] Date of Birth/Sex: Treating RN: [birth year] (62 y.o. [name] Primary Care Arush Gatliff: [name] Other Clinician: Referring Zavior Thomason: Treating Chakia Counts/Extender: [name] Weeks in Treatment: 58 Wound Status Wound Number: 19 Primary Venous Leg Ulcer Etiology: Wound Location: Left, Posterior Lower Leg Wound Open Wounding Event: Gradually Appeared Status: Date Acquired: 11/10/2022 Comorbid Anemia, Chronic Obstructive Pulmonary Disease (COPD), Coronary Weeks Of Treatment: 9 History: Artery Disease, Peripheral Arterial Disease, Peripheral Venous Clustered Wound: Yes Disease, Hepatitis C, Osteoarthritis, Neuropathy, Received Chemotherapy Photos SYD, NEWSOME ([id number]) [id number] Page 20 of 21 Wound Measurements Length: (cm) 3.5 Width: (cm) 2 Depth: (cm) 0.2 Area: (cm) 5.498 Volume: (cm) 1.1 % Reduction in Area: -677.7% % Reduction in Volume: -1449.3% Wound  Description Classification: Full Thickness Without Exposed Support Exudate Amount: Medium Exudate Type: Serous Exudate Color: amber Structures Treatment Notes Wound #19 (Lower Leg) Wound Laterality: Left, Posterior Cleanser Soap and Water Discharge Instruction: Gently cleanse wound with antibacterial soap, rinse and pat dry prior to dressing wounds Wound Cleanser Discharge Instruction: Wash your hands with soap and water. Remove old dressing, discard into plastic bag and place into trash. Cleanse the wound with Wound Cleanser prior to applying a clean dressing using gauze sponges, not tissues or cotton balls. Do not scrub or use excessive force. Pat dry using gauze sponges, not tissue or cotton balls. Peri-Wound Care Topical Primary Dressing Silvercel Small 2x2 (in/in) Discharge Instruction: Apply Silvercel Small 2x2 (in/in) as instructed Secondary Dressing Zetuvit Plus 4x8 (in/in) Secured With Compression Wrap UNNA BOOT - Unna-Z Zinc and Calamine Boot, 4x10 (in/yd) Discharge Instruction: Unna Paste, Kerlix and Coban from base of toes to three finger widths below bend in knee. Compression Stockings Add-Ons Electronic Signature(s) Signed: 01/19/2023 5:22:58 PM By: Midge Aver MSN RN CNS WTA Entered By: Midge Aver on 01/19/2023 13:31:51 [name] ([id number]) [id number] Page 21 of 21 -------------------------------------------------------------------------------- Vitals Details Patient Name: Date of Service: YGNACIO, FECTEAU 01/19/2023 1:15 PM Medical Record Number: [id number] Patient Account Number: [id number] Date of Birth/Sex: Treating  RN: [birth year] (62 y.o. [name] Primary Care Jesse Nosbisch: [name] Other Clinician: Referring Lauramae Kneisley: Treating Stephenie Navejas/Extender: [name] Weeks in Treatment: 59 Vital Signs Time Taken: 13:12 Temperature (F): 98.4 Height (in): 69 Pulse (bpm): 106 Weight (lbs):  150 Respiratory Rate (breaths/min): 16 Body Mass Index (BMI): 22.1 Blood Pressure (mmHg): 139/79 Reference Range: 80 - 120 mg / dl Electronic Signature(s) Signed: 01/19/2023 5:22:58 PM By: Midge Aver MSN RN CNS WTA Entered By: Midge Aver on 01/19/2023 13:12:36

## 2023-01-22 ENCOUNTER — Ambulatory Visit (INDEPENDENT_AMBULATORY_CARE_PROVIDER_SITE_OTHER): Payer: Medicaid Other | Admitting: Nurse Practitioner

## 2023-01-22 ENCOUNTER — Encounter (INDEPENDENT_AMBULATORY_CARE_PROVIDER_SITE_OTHER): Payer: Self-pay | Admitting: Nurse Practitioner

## 2023-01-22 VITALS — BP 115/77 | HR 84 | Resp 18

## 2023-01-22 DIAGNOSIS — I739 Peripheral vascular disease, unspecified: Secondary | ICD-10-CM

## 2023-01-22 DIAGNOSIS — I87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 hypertension (idiopathic) with ulcer and inflammation of left lower extremity: Secondary | ICD-10-CM | POA: Diagnosis not present

## 2023-01-22 DIAGNOSIS — E43 Unspecified severe protein-calorie malnutrition: Secondary | ICD-10-CM | POA: Diagnosis not present

## 2023-01-22 DIAGNOSIS — Z9889 Other specified postprocedural states: Secondary | ICD-10-CM | POA: Diagnosis not present

## 2023-01-22 NOTE — Progress Notes (Signed)
Subjective:    Patient ID: Chad [name], male    DOB: 16-Feb-1961, 62 y.o.   MRN: [id number] Chief Complaint  Patient presents with   Venous Insufficiency    The patient returns to the office for followup and review status post angiogram with intervention on 01/01/23.   Procedure: During the patient's intervention it was noted that his vessels were primarily patent with no significant stenosis.  It was also noted that he had bilateral peroneal vessel patency bilaterally.  No intervention was performed.  The patient continues to have bilateral lower extremity wounds followed by the wound care center which remains difficult to heal especially in the setting of the patient's lower extremity edema.  There have been no significant changes to the patient's overall health care.  No documented history of amaurosis fugax or recent TIA symptoms. There are no recent neurological changes noted. No documented history of DVT, PE or superficial thrombophlebitis. The patient denies recent episodes of angina or shortness of breath.       Review of Systems  Cardiovascular:  Positive for leg swelling.  Skin:  Positive for wound.  Neurological:  Positive for weakness.  All other systems reviewed and are negative.      Objective:   Physical Exam Vitals reviewed.  HENT:     Head: Normocephalic.  Cardiovascular:     Rate and Rhythm: Normal rate.  Pulmonary:     Effort: Pulmonary effort is normal.  Musculoskeletal:     Right lower leg: Edema present.     Left lower leg: Edema present.  Skin:    General: Skin is warm and dry.  Neurological:     Mental Status: He is alert and oriented to person, place, and time.     Motor: Weakness present.  Psychiatric:        Mood and Affect: Mood normal.        Behavior: Behavior normal.        Thought Content: Thought content normal.        Judgment: Judgment normal.     BP 115/77 (BP Location: Right Arm)   Pulse 84   Resp 18   Past Medical  History:  Diagnosis Date   Anemia    Anxiety    ARF (acute respiratory failure) (HCC)    Atherosclerosis of arteries of extremities (HCC)    Benign prostatic hyperplasia with urinary obstruction    Bladder cancer (HCC)    BPH with obstruction/lower urinary tract symptoms    Chronic viral hepatitis C (HCC)    Colon cancer (HCC)    COPD (chronic obstructive pulmonary disease) (HCC)    Depression    Dysphagia    Family history of colon cancer    Family history of kidney cancer    Family history of leukemia    Family history of prostate cancer    Generalized anxiety disorder    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    GERD (gastroesophageal reflux disease)    Hepatitis    chronic hep c   Hydronephrosis    Hydronephrosis with ureteral stricture    Hyperlipidemia    Insomnia, unspecified    Knee pain    Left   Major depressive disorder, recurrent, moderate (HCC)    Malignant neoplasm of colon (HCC)    Malnutrition (HCC)    Muscle weakness (generalized)    Nerve pain    Other abnormalities of gait and mobility    Other chronic pain    Peripheral vascular  disease (HCC)    Personal history of transient cerebral ischemia    Pressure ulcer    Prostate cancer (HCC)    Stroke (HCC)    Tinea unguium    Tobacco user    Unspecified protein-calorie malnutrition (HCC)    Ureteral cancer, right (HCC)    Urinary frequency    Venous hypertension of both lower extremities    Xerosis cutis     Social History   Socioeconomic History   Marital status: Single    Spouse name: Not on file   Number of children: Not on file   Years of education: Not on file   Highest education level: Not on file  Occupational History   Not on file  Tobacco Use   Smoking status: Every Day    Packs/day: .5    Types: Cigarettes   Smokeless tobacco: Never  Vaping Use   Vaping Use: Never used  Substance and Sexual Activity   Alcohol use: Not Currently   Drug use: Not Currently     Types: Marijuana, Methylphenidate    Comment: quit 1997-1998 ish   Sexual activity: Not Currently  Other Topics Concern   Not on file  Social History Narrative    used to live [place]; moved  To [place]- end of April 2019; in Nursing home; 1pp/day; quit alcohol. 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        Family- dad- prostate ca [at 78y]; brother- 25 died of prostate cancer; brother- 56- no cancers [[place]]; sonThayer Ohm [[place]];[name]-32y prostate ca [facility] [place]]; daughter- 11 [[place]]; another daughter 62 [[place]/addict]. will refer genetics counseling. Given MSI- abnormal; highly suspicious of Lynch syndrome.  Patient's son Cristal Deer aware of high possible lynch syndrome.   Social Determinants of Health   Financial Resource Strain: Not on file  Food Insecurity: No Food Insecurity (11/17/2022)   Hunger Vital Sign    Worried About Running Out of Food in the Last Year: Never true    Ran Out of Food in the Last Year: Never true  Transportation Needs: No Transportation Needs (11/17/2022)   PRAPARE - Administrator, Civil Service (Medical): No    Lack of Transportation (Non-Medical): No  Physical Activity: Not on file  Stress: Not on file  Social Connections: Not on file  Intimate Partner Violence: Not At Risk (11/17/2022)   Humiliation, Afraid, Rape, and Kick questionnaire    Fear of Current or Ex-Partner: No    Emotionally Abused: No    Physically Abused: No    Sexually Abused: No    Past Surgical History:  Procedure Laterality Date   COLON SURGERY     En bloc extended right hemicolectomy 07/2017   COLONOSCOPY WITH PROPOFOL N/A 11/06/2020   Procedure: COLONOSCOPY WITH PROPOFOL;  Surgeon: Wyline Mood, MD;  Location: [facility] ENDOSCOPY;  Service: Gastroenterology;  Laterality: N/A;   COLONOSCOPY WITH PROPOFOL N/A 07/31/2021   Procedure: COLONOSCOPY WITH PROPOFOL;  Surgeon: Earline Mayotte, MD;  Location: [facility] ENDOSCOPY;  Service: Endoscopy;  Laterality: N/A;   CYSTOSCOPY W/ RETROGRADES Right  08/30/2018   Procedure: CYSTOSCOPY WITH RETROGRADE PYELOGRAM;  Surgeon: Vanna Scotland, MD;  Location: [facility] ORS;  Service: Urology;  Laterality: Right;   CYSTOSCOPY WITH STENT PLACEMENT Right 04/25/2018   Procedure: CYSTOSCOPY WITH STENT PLACEMENT;  Surgeon: Vanna Scotland, MD;  Location: [facility] ORS;  Service: Urology;  Laterality: Right;   CYSTOSCOPY WITH STENT PLACEMENT Right 08/30/2018   Procedure: CYSTOSCOPY WITH STENT Exchange;  Surgeon: Vanna Scotland, MD;  Location: [facility] ORS;  Service: Urology;  Laterality: Right;   CYSTOSCOPY WITH STENT PLACEMENT Right 03/07/2019   Procedure: CYSTOSCOPY WITH STENT Exchange;  Surgeon: Vanna Scotland, MD;  Location: [facility] ORS;  Service: Urology;  Laterality: Right;   CYSTOSCOPY WITH STENT PLACEMENT Right 11/21/2019   Procedure: CYSTOSCOPY WITH STENT Exchange;  Surgeon: Vanna Scotland, MD;  Location: [facility] ORS;  Service: Urology;  Laterality: Right;   LOWER EXTREMITY ANGIOGRAPHY Left 05/23/2019   Procedure: LOWER EXTREMITY ANGIOGRAPHY;  Surgeon: Annice Needy, MD;  Location: [facility];  Service: Cardiovascular;  Laterality: Left;   LOWER EXTREMITY ANGIOGRAPHY Right 05/30/2019   Procedure: LOWER EXTREMITY ANGIOGRAPHY;  Surgeon: Annice Needy, MD;  Location: [facility];  Service: Cardiovascular;  Laterality: Right;   LOWER EXTREMITY ANGIOGRAPHY Right 02/13/2020   Procedure: LOWER EXTREMITY ANGIOGRAPHY;  Surgeon: Annice Needy, MD;  Location: [facility];  Service: Cardiovascular;  Laterality: Right;   LOWER EXTREMITY ANGIOGRAPHY Left 02/20/2020   Procedure: LOWER EXTREMITY ANGIOGRAPHY;  Surgeon: Annice Needy, MD;  Location: [facility];  Service: Cardiovascular;  Laterality: Left;   LOWER EXTREMITY ANGIOGRAPHY Left 01/01/2023   Procedure: Lower Extremity Angiography;  Surgeon: Annice Needy, MD;  Location: [facility];  Service: Cardiovascular;  Laterality: Left;   PORTA CATH INSERTION N/A 02/28/2019   Procedure:  PORTA CATH INSERTION;  Surgeon: Annice Needy, MD;  Location: [facility];  Service: Cardiovascular;  Laterality: N/A;   ROBOT ASSISTED LAPAROSCOPIC PARTIAL COLECTOMY  11/17/2022   tumor removed       Family History  Problem Relation Age of Onset   Prostate cancer Father 56   Cancer Brother 29       unsure type   Cancer Paternal Uncle        unsure type   Cancer Maternal Grandmother        unsure type   Cancer Paternal Grandmother        unsure type   Kidney cancer Paternal Grandfather    Cancer Other        unsure types   Leukemia Son    Cancer Son        other cancers, possibly colon    Allergies  Allergen Reactions   Penicillins Rash       Latest Ref Rng & Units 12/29/2022   10:12 AM 12/08/2022    2:13 PM 11/19/2022    6:03 AM  CBC  WBC 4.0 - 10.5 K/uL 5.5  6.8  6.2   Hemoglobin 13.0 - 17.0 g/dL 45.4  09.8  11.9   Hematocrit 39.0 - 52.0 % 38.9  40.1  36.0   Platelets 150 - 400 K/uL 141  199  145       CMP     Component Value Date/Time   NA 136 12/29/2022 1012   K 3.8 12/29/2022 1012   CL 107 12/29/2022 1012   CO2 25 12/29/2022 1012   GLUCOSE 117 (H) 12/29/2022 1012   BUN 17 12/29/2022 1012   CREATININE 0.98 12/29/2022 1012   CALCIUM 8.5 (L) 12/29/2022 1012   PROT 7.5 12/29/2022 1012   ALBUMIN 3.4 (L) 12/29/2022 1012   AST 49 (H) 12/29/2022 1012   ALT 38 12/29/2022 1012   ALKPHOS 121 12/29/2022 1012   BILITOT 0.5 12/29/2022 1012   GFRNONAA >60 12/29/2022 1012     No results found.     Assessment & Plan:   1. Peripheral arterial disease  with history of revascularization [facility]) Previous angiogram the patient did not undergo intervention due to no revascularization sites available.  With his primary runoff being the peroneal vessels bilaterally.  In this instance we feel the patient has been revascularized is much as possible currently.  We will still continue to monitor the patient return in 3 months with noninvasive studies  2.  Protein-calorie malnutrition, severe Muscle certainly worsen his lower extremity edema in addition to not being able to elevate his lower extremities.   Current Outpatient Medications on File Prior to Visit  Medication Sig Dispense Refill   acetaminophen (TYLENOL) 325 MG tablet Take 650 mg by mouth 3 (three) times daily.      Amino Acids-Protein Hydrolys (PRO-STAT) LIQD Take 45 mLs by mouth daily.     aspirin EC 81 MG tablet Take 1 tablet (81 mg total) by mouth daily. 150 tablet 2   Calcium Carb-Cholecalciferol (CALCIUM CARBONATE-VITAMIN D3 PO) Take 2 tablets by mouth daily. 600-400 mg     clopidogrel (PLAVIX) 75 MG tablet Take 75 mg by mouth daily.     cyclobenzaprine (FEXMID) 7.5 MG tablet Take 15 mg by mouth at bedtime.     escitalopram (LEXAPRO) 5 MG tablet Take 5 mg by mouth daily.     ferrous fumarate (FERRETTS) 325 (106 Fe) MG TABS tablet Take 1 tablet by mouth.     gabapentin (NEURONTIN) 100 MG capsule Take 100 mg by mouth 3 (three) times daily.     mirtazapine (REMERON) 7.5 MG tablet Take 7.5 mg by mouth at bedtime.     Multiple Vitamin (MULTIVITAMIN) tablet Take 1 tablet by mouth daily.     oxycodone (OXY-IR) 5 MG capsule Take 10 mg by mouth every 6 (six) hours.     Polyethyl Glycol-Propyl Glycol (SYSTANE) 0.4-0.3 % SOLN Apply to eye.     senna (SENOKOT) 8.6 MG TABS tablet Take 1 tablet by mouth at bedtime.     senna-docusate (SENOKOT-S) 8.6-50 MG tablet Take 1 tablet by mouth daily.     tamsulosin (FLOMAX) 0.4 MG CAPS capsule Take 1 capsule (0.4 mg total) by mouth daily after supper. 30 capsule 3   traMADol (ULTRAM) 50 MG tablet Take 1 tablet (50 mg total) by mouth every 6 (six) hours as needed. 20 tablet 0   atorvastatin (LIPITOR) 10 MG tablet Take 1 tablet (10 mg total) by mouth daily. 30 tablet 11   Current Facility-Administered Medications on File Prior to Visit  Medication Dose Route Frequency Provider Last Rate Last Admin   heparin lock flush 100 UNIT/ML injection              There are no Patient Instructions on file for this visit. No follow-ups on file.   Georgiana Spinner, NP

## 2023-01-22 NOTE — Progress Notes (Signed)
[place], RAV[place]FT ([id number]) [id number] Page 1 of 12 Visit Report for 01/22/2023 Arrival Information Details Patient Name: Date of Service: Chad [name], Chad [name] 01/22/2023 3:45 PM Medical Record Number: [id number] Patient Account Number: [id number] Date of Birth/Sex: Treating RN: Oct 28, 1960 (62 y.o. [name] Primary Care Senay Sistrunk: [name] Other Clinician: Referring Meegan Shanafelt: Treating Jackalynn Art/Extender: [name] in Treatment: 59 Visit Information History Since Last Visit Added or deleted any medications: No Patient Arrived: Wheel Chair Any new allergies or adverse reactions: No Arrival Time: 16:15 Had a fall or experienced change in No Accompanied By: self activities of daily living that may affect Transfer Assistance: None risk of falls: Patient Identification Verified: Yes Hospitalized since last visit: No Secondary Verification Process Completed: Yes Has Dressing in Place as Prescribed: Yes Patient Requires Transmission-Based No Has Compression in Place as Prescribed: Yes Precautions: Pain Present Now: No Patient Has Alerts: Yes Patient Alerts: Patient on Blood Thinner NOT diabetic aspirin 81mg  Lives [employer] [facility] ABI R 0.81 11/05/22 ABI L 0.59 11/05/22 Electronic Signature(s) Signed: 01/22/2023 4:51:03 PM By: Angelina Pih Entered By: Angelina Pih on 01/22/2023 16:51:03 -------------------------------------------------------------------------------- Clinic Level of Care Assessment Details Patient Name: Date of Service: Chad [name], Chad [name] 01/22/2023 3:45 PM Medical Record Number: [id number] Patient Account Number: [id number] Date of Birth/Sex: Treating RN: [birth year] (62 y.o. [name] Primary Care Alysabeth Scalia: [name] Other Clinician: Referring Joelene Barriere: Treating Tommie Bohlken/Extender: [name] in Treatment: 58 Clinic Level of Care Assessment Items TOOL 1 Quantity  Score []  - 0 Use when EandM and Procedure is performed on INITIAL visit ASSESSMENTS - Nursing Assessment / Reassessment []  - 0 General Physical Exam (combine w/ comprehensive assessment (listed just below) when performed on new pt. [address], [address]SHRAF ([id number]) [id number] Page 2 of 12 []  - 0 Comprehensive Assessment (HX, ROS, Risk Assessments, Wounds Hx, etc.) ASSESSMENTS - Wound and Skin Assessment / Reassessment []  - 0 Dermatologic / Skin Assessment (not related to wound area) ASSESSMENTS - Ostomy and/or Continence Assessment and Care []  - 0 Incontinence Assessment and Management []  - 0 Ostomy Care Assessment and Management (repouching, etc.) PROCESS - Coordination of Care []  - 0 Simple Patient / Family Education for ongoing care []  - 0 Complex (extensive) Patient / Family Education for ongoing care []  - 0 Staff obtains Chiropractor, Records, T Results / Process Orders est []  - 0 Staff telephones HHA, Nursing Homes / Clarify orders / etc []  - 0 Routine Transfer to another Facility (non-emergent condition) []  - 0 Routine Hospital Admission (non-emergent condition) []  - 0 New Admissions / Manufacturing engineer / Ordering NPWT Apligraf, etc. , []  - 0 Emergency Hospital Admission (emergent condition) PROCESS - Special Needs []  - 0 Pediatric / Minor Patient Management []  - 0 Isolation Patient Management []  - 0 Hearing / Language / Visual special needs []  - 0 Assessment of Community assistance (transportation, D/C planning, etc.) []  - 0 Additional assistance / Altered mentation []  - 0 Support Surface(s) Assessment (bed, cushion, seat, etc.) INTERVENTIONS - Miscellaneous []  - 0 External ear exam []  - 0 Patient Transfer (multiple staff / Nurse, adult / Similar devices) []  - 0 Simple Staple / Suture removal (25 or less) []  - 0 Complex Staple / Suture removal (26 or more) []  - 0 Hypo/Hyperglycemic Management (do not check if billed  separately) []  - 0 Ankle / Brachial Index (ABI) - do not check if billed separately Has the patient been seen at the hospital within the last three years: Yes Total  Score: 0 Level Of Care: ____ Electronic Signature(s) Signed: 01/22/2023 5:12:33 PM By: Angelina Pih Entered By: Angelina Pih on 01/22/2023 16:55:29 -------------------------------------------------------------------------------- Compression Therapy Details Patient Name: Date of Service: Chad [name], Chad [name] 01/22/2023 3:45 PM Medical Record Number: [id number] Patient Account Number: [id number] Date of Birth/Sex: Treating RN: 1960/11/28 (62 y.o. [name] [place], [place] ([id number]) [id number] Page 3 of 12 Primary Care Florita Nitsch: [name] Other Clinician: Referring Sharon Stapel: Treating Atlee Kluth/Extender: [name] in Treatment: 58 Compression Therapy Performed for Wound Assessment: Wound #17 Left,Anterior Lower Leg Performed By: Clinician Angelina Pih, RN Compression Type: Henriette Combs Electronic Signature(s) Signed: 01/22/2023 4:54:08 PM By: Angelina Pih Entered By: Angelina Pih on 01/22/2023 16:54:08 -------------------------------------------------------------------------------- Compression Therapy Details Patient Name: Date of Service: Chad [name], Chad [name] 01/22/2023 3:45 PM Medical Record Number: [id number] Patient Account Number: [id number] Date of Birth/Sex: Treating RN: Jan 15, 1961 (62 y.o. [name] Primary Care Rawlins Stuard: [name] Other Clinician: Referring Ritvik Mczeal: Treating Isam Unrein/Extender: [name] Weeks in Treatment: 55 Compression Therapy Performed for Wound Assessment: Wound #12 Left,Distal,Medial Ankle Performed By: Clinician Angelina Pih, RN Compression Type: Henriette Combs Electronic Signature(s) Signed: 01/22/2023 4:54:08 PM By: Angelina Pih Entered By: Angelina Pih on 01/22/2023  16:54:08 -------------------------------------------------------------------------------- Compression Therapy Details Patient Name: Date of Service: Chad [name], Chad [name] 01/22/2023 3:45 PM Medical Record Number: [id number] Patient Account Number: [id number] Date of Birth/Sex: Treating RN: 03-Jun-1961 (62 y.o. [name] Primary Care Reyaan Thoma: [name] Other Clinician: Referring Nerine Pulse: Treating Maanya Hippert/Extender: [name] Weeks in Treatment: 53 Compression Therapy Performed for Wound Assessment: Wound #15 Left,Dorsal Foot Performed By: Clinician Angelina Pih, RN Compression Type: Henriette Combs Electronic Signature(s) Signed: 01/22/2023 4:54:08 PM By: Angelina Pih Entered By: Angelina Pih on 01/22/2023 16:54:08 [name] ([id number]) [id number] Page 4 of 12 -------------------------------------------------------------------------------- Compression Therapy Details Patient Name: Date of Service: Chad [name], Chad [name] 01/22/2023 3:45 PM Medical Record Number: [id number] Patient Account Number: [id number] Date of Birth/Sex: Treating RN: [birth year] (62 y.o. [name] Primary Care Ashon Rosenberg: [name] Other Clinician: Referring Julyssa Kyer: Treating Modean Mccullum/Extender: [name] Weeks in Treatment: 15 Compression Therapy Performed for Wound Assessment: Wound #16 Right Calcaneus Performed By: Clinician Angelina Pih, RN Compression Type: Henriette Combs Electronic Signature(s) Signed: 01/22/2023 4:54:08 PM By: Angelina Pih Entered By: Angelina Pih on 01/22/2023 16:54:08 -------------------------------------------------------------------------------- Compression Therapy Details Patient Name: Date of Service: Chad [name], Chad [name] 01/22/2023 3:45 PM Medical Record Number: [id number] Patient Account Number: [id number] Date of Birth/Sex: Treating RN: 1960/09/17 (62 y.o. [name] Primary Care Kassity Woodson:  [name] Other Clinician: Referring Eun Vermeer: Treating Hrishikesh Hoeg/Extender: [name] Weeks in Treatment: 78 Compression Therapy Performed for Wound Assessment: Wound #19 Left,Posterior Lower Leg Performed By: Clinician Angelina Pih, RN Compression Type: Henriette Combs Electronic Signature(s) Signed: 01/22/2023 4:54:08 PM By: Angelina Pih Entered By: Angelina Pih on 01/22/2023 16:54:08 -------------------------------------------------------------------------------- Encounter Discharge Information Details Patient Name: Date of Service: [name] 01/22/2023 3:45 PM [name] ([id number]) [id number] Page 5 of 12 Medical Record Number: [id number] Patient Account Number: [id number] Date of Birth/Sex: Treating RN: 1961-05-29 (62 y.o. [name] Primary Care Zuriel Yeaman: [name] Other Clinician: Referring Sacha Topor: Treating Khylan Sawyer/Extender: [name] in Treatment: 37 Encounter Discharge Information Items Discharge Condition: Stable Ambulatory Status: Wheelchair Discharge Destination: Skilled Nursing Facility Telephoned: No Orders Sent: No Transportation: Other Accompanied By: self Schedule Follow-up Appointment: Yes Clinical Summary of Care: Electronic Signature(s) Signed: 01/22/2023 4:55:24 PM By: Angelina Pih Entered By: Angelina Pih on 01/22/2023 16:55:24 -------------------------------------------------------------------------------- Wound  Assessment Details Patient Name: Date of Service: Chad [name], Chad [name] 01/22/2023 3:45 PM Medical Record Number: [id number] Patient Account Number: [id number] Date of Birth/Sex: Treating RN: 27-Jan-1961 (62 y.o. [name] Primary Care Sadeen Wiegel: [name] Other Clinician: Referring Shyane Fossum: Treating Cambry Spampinato/Extender: [name] Weeks in Treatment: 58 Wound Status Wound Number: 12 Primary Venous Leg  Ulcer Etiology: Wound Location: Left, Distal, Medial Ankle Wound Open Wounding Event: Gradually Appeared Status: Date Acquired: 07/12/2019 Comorbid Anemia, Chronic Obstructive Pulmonary Disease (COPD), Weeks Of Treatment: 58 History: Coronary Artery Disease, Peripheral Arterial Disease, Peripheral Clustered Wound: No Venous Disease, Hepatitis C, Osteoarthritis, Neuropathy, Received Chemotherapy Wound Measurements Length: (cm) 2.8 Width: (cm) 1.2 Depth: (cm) 0.2 Area: (cm) 2.639 Volume: (cm) 0.528 % Reduction in Area: 61.8% % Reduction in Volume: 61.8% Wound Description Classification: Full Thickness Without Exposed Suppor Exudate Amount: Medium Exudate Type: Serosanguineous Exudate Color: red, brown t Structures Treatment Notes Wound #12 (Ankle) Wound Laterality: Left, Medial, Distal Cleanser Soap and Water Discharge Instruction: Gently cleanse wound with antibacterial soap, rinse and pat dry prior to dressing wounds [name] ([id number]) [id number] Page 6 of 12 Wound Cleanser Discharge Instruction: Wash your hands with soap and water. Remove old dressing, discard into plastic bag and place into trash. Cleanse the wound with Wound Cleanser prior to applying a clean dressing using gauze sponges, not tissues or cotton balls. Do not scrub or use excessive force. Pat dry using gauze sponges, not tissue or cotton balls. Peri-Wound Care Topical Primary Dressing Silvercel Small 2x2 (in/in) Discharge Instruction: Apply Silvercel Small 2x2 (in/in) as instructed Secondary Dressing Zetuvit Plus 4x8 (in/in) Secured With Compression Wrap UNNA BOOT - Unna-Z Zinc and Calamine Boot, 4x10 (in/yd) Discharge Instruction: Unna Paste, Kerlix and Coban from base of toes to three finger widths below bend in knee. Compression Stockings Add-Ons Electronic Signature(s) Signed: 01/22/2023 4:51:22 PM By: Angelina Pih Entered By: Angelina Pih on 01/22/2023  16:51:22 -------------------------------------------------------------------------------- Wound Assessment Details Patient Name: Date of Service: Chad [name], Chad [name] 01/22/2023 3:45 PM Medical Record Number: [id number] Patient Account Number: [id number] Date of Birth/Sex: Treating RN: [birth year] (62 y.o. [name] Primary Care Detrice Cales: [name] Other Clinician: Referring Porfirio Bollier: Treating Ruthene Methvin/Extender: [name] Weeks in Treatment: 58 Wound Status Wound Number: 15 Primary Venous Leg Ulcer Etiology: Wound Location: Left, Dorsal Foot Wound Open Wounding Event: Gradually Appeared Status: Date Acquired: 10/01/2022 Comorbid Anemia, Chronic Obstructive Pulmonary Disease (COPD), Weeks Of Treatment: 15 History: Coronary Artery Disease, Peripheral Arterial Disease, Peripheral Clustered Wound: Yes Venous Disease, Hepatitis C, Osteoarthritis, Neuropathy, Received Chemotherapy Wound Measurements Length: (cm) 4 Width: (cm) 1.3 Depth: (cm) 0.3 Area: (cm) 4.084 Volume: (cm) 1.225 % Reduction in Area: -73.3% % Reduction in Volume: -419.1% Epithelialization: None Tunneling: No Undermining: No Wound Description Classification: Full Thickness Without Exposed Suppor Exudate Amount: Medium Exudate Type: Serosanguineous Exudate Color: red, brown t Structures Wound Bed [name] ([id number]) [id number] Page 7 of 12 Granulation Amount: None Present (0%) Necrotic Amount: Large (67-100%) Necrotic Quality: Adherent Slough Treatment Notes Wound #15 (Foot) Wound Laterality: Dorsal, Left Cleanser Soap and Water Discharge Instruction: Gently cleanse wound with antibacterial soap, rinse and pat dry prior to dressing wounds Wound Cleanser Discharge Instruction: Wash your hands with soap and water. Remove old dressing, discard into plastic bag and place into trash. Cleanse the wound with Wound Cleanser prior to applying a clean  dressing using gauze sponges, not tissues or cotton balls. Do not scrub or use excessive force. Pat dry using gauze sponges, not tissue or  cotton balls. Peri-Wound Care Topical Primary Dressing Silvercel Small 2x2 (in/in) Discharge Instruction: Apply Silvercel Small 2x2 (in/in) as instructed Secondary Dressing Zetuvit Plus 4x8 (in/in) Secured With Compression Wrap UNNA BOOT - Unna-Z Zinc and Calamine Boot, 4x10 (in/yd) Discharge Instruction: Unna Paste, Kerlix and Coban from base of toes to three finger widths below bend in knee. Compression Stockings Add-Ons Electronic Signature(s) Signed: 01/22/2023 4:51:52 PM By: Angelina Pih Entered By: Angelina Pih on 01/22/2023 16:51:52 -------------------------------------------------------------------------------- Wound Assessment Details Patient Name: Date of Service: Chad [name], Chad [name] 01/22/2023 3:45 PM Medical Record Number: [id number] Patient Account Number: [id number] Date of Birth/Sex: Treating RN: 08-19-1960 (62 y.o. [name] Primary Care Anyssa Sharpless: [name] Other Clinician: Referring Haywood Meinders: Treating Ariea Rochin/Extender: [name] Weeks in Treatment: 58 Wound Status Wound Number: 16 Primary Pressure Ulcer Etiology: Wound Location: Right Calcaneus Wound Open Wounding Event: Gradually Appeared Status: Date Acquired: 09/26/2022 Comorbid Anemia, Chronic Obstructive Pulmonary Disease (COPD), Weeks Of Treatment: 15 History: Coronary Artery Disease, Peripheral Arterial Disease, Peripheral Clustered Wound: Yes Venous Disease, Hepatitis C, Osteoarthritis, Neuropathy, Received Chemotherapy Wound Measurements Length: (cm) 3 Width: (cm) 1.5 Fauteux, Felice ([id number]) Depth: (cm) 0.2 Area: (cm) 3.534 Volume: (cm) 0.707 % Reduction in Area: -275.2% % Reduction in Volume: -276.1% [id number] Page 8 of 12 Epithelialization: None Tunneling: No Undermining: No Wound  Description Classification: Exudate Amount: Exudate Type: Exudate Color: Category/Stage III Medium Serosanguineous red, brown Wound Bed Granulation Amount: Small (1-33%) Granulation Quality: Pink Necrotic Amount: Large (67-100%) Necrotic Quality: Adherent Slough Treatment Notes Wound #16 (Calcaneus) Wound Laterality: Right Cleanser Soap and Water Discharge Instruction: Gently cleanse wound with antibacterial soap, rinse and pat dry prior to dressing wounds Wound Cleanser Discharge Instruction: Wash your hands with soap and water. Remove old dressing, discard into plastic bag and place into trash. Cleanse the wound with Wound Cleanser prior to applying a clean dressing using gauze sponges, not tissues or cotton balls. Do not scrub or use excessive force. Pat dry using gauze sponges, not tissue or cotton balls. Peri-Wound Care Topical Primary Dressing Silvercel Small 2x2 (in/in) Discharge Instruction: Apply Silvercel Small 2x2 (in/in) as instructed Secondary Dressing Zetuvit Plus 4x8 (in/in) Secured With Compression Wrap UNNA BOOT - Unna-Z Zinc and Calamine Boot, 4x10 (in/yd) Discharge Instruction: Unna Paste, Kerlix and Coban from base of toes to three finger widths below bend in knee. Compression Stockings Add-Ons Electronic Signature(s) Signed: 01/22/2023 4:52:16 PM By: Angelina Pih Entered By: Angelina Pih on 01/22/2023 16:52:15 -------------------------------------------------------------------------------- Wound Assessment Details Patient Name: Date of Service: Chad [name], Chad [name] 01/22/2023 3:45 PM Medical Record Number: [id number] Patient Account Number: [id number] Date of Birth/Sex: Treating RN: Jan 08, 1961 (62 y.o. [name] Primary Care Freeland Pracht: [name] Other Clinician: Referring Champ Keetch: Treating Myrikal Messmer/Extender: [name] Weeks in Treatment: [address], [place] ([id number]) [id number] Page 9 of  12 Wound Status Wound Number: 17 Primary Pressure Ulcer Etiology: Wound Location: Left, Anterior Lower Leg Wound Open Wounding Event: Pressure Injury Status: Date Acquired: 09/30/2022 Comorbid Anemia, Chronic Obstructive Pulmonary Disease (COPD), Weeks Of Treatment: 14 History: Coronary Artery Disease, Peripheral Arterial Disease, Peripheral Clustered Wound: No Venous Disease, Hepatitis C, Osteoarthritis, Neuropathy, Received Chemotherapy Wound Measurements Length: (cm) 3.5 Width: (cm) 2.7 Depth: (cm) 0.3 Area: (cm) 7.422 Volume: (cm) 2.227 % Reduction in Area: -80% % Reduction in Volume: -440.5% Epithelialization: None Tunneling: No Undermining: No Wound Description Classification: Category/Stage II Exudate Amount: Medium Exudate Type: Serosanguineous Exudate Color: red, brown Foul Odor After Cleansing: No Slough/Fibrino Yes Wound Bed Granulation Amount: None Present (0%)  Necrotic Amount: Large (67-100%) Necrotic Quality: Adherent Slough Treatment Notes Wound #17 (Lower Leg) Wound Laterality: Left, Anterior Cleanser Soap and Water Discharge Instruction: Gently cleanse wound with antibacterial soap, rinse and pat dry prior to dressing wounds Wound Cleanser Discharge Instruction: Wash your hands with soap and water. Remove old dressing, discard into plastic bag and place into trash. Cleanse the wound with Wound Cleanser prior to applying a clean dressing using gauze sponges, not tissues or cotton balls. Do not scrub or use excessive force. Pat dry using gauze sponges, not tissue or cotton balls. Peri-Wound Care Topical Primary Dressing Silvercel Small 2x2 (in/in) Discharge Instruction: Apply Silvercel Small 2x2 (in/in) as instructed Secondary Dressing Zetuvit Plus 4x8 (in/in) Secured With Compression Wrap UNNA BOOT - Unna-Z Zinc and Calamine Boot, 4x10 (in/yd) Discharge Instruction: Unna Paste, Kerlix and Coban from base of toes to three finger widths below bend in  knee. Compression Stockings Add-Ons Electronic Signature(s) Signed: 01/22/2023 4:52:42 PM By: Angelina Pih Entered By: Angelina Pih on 01/22/2023 16:52:42 [name] ([id number]) [id number] Page 10 of 12 -------------------------------------------------------------------------------- Wound Assessment Details Patient Name: Date of Service: Chad [name], Chad [name] 01/22/2023 3:45 PM Medical Record Number: [id number] Patient Account Number: [id number] Date of Birth/Sex: Treating RN: 03-20-1961 (62 y.o. [name] Primary Care Lien Lyman: [name] Other Clinician: Referring Francys Bolin: Treating Keyari Kleeman/Extender: [name] Weeks in Treatment: 58 Wound Status Wound Number: 18 Primary Pressure Ulcer Etiology: Wound Location: Right, Medial T Second oe Wound Open Wounding Event: Pressure Injury Status: Date Acquired: 09/26/2022 Comorbid Anemia, Chronic Obstructive Pulmonary Disease (COPD), Weeks Of Treatment: 14 History: Coronary Artery Disease, Peripheral Arterial Disease, Peripheral Clustered Wound: No Venous Disease, Hepatitis C, Osteoarthritis, Neuropathy, Received Chemotherapy Wound Measurements Length: (cm) 1.3 Width: (cm) 1.2 Depth: (cm) 0.1 Area: (cm) 1.225 Volume: (cm) 0.123 % Reduction in Area: -122.7% % Reduction in Volume: -123.6% Epithelialization: None Tunneling: No Undermining: No Wound Description Classification: Category/Stage II Exudate Amount: Medium Exudate Type: Serosanguineous Exudate Color: red, brown Foul Odor After Cleansing: No Slough/Fibrino Yes Wound Bed Granulation Amount: None Present (0%) Necrotic Amount: Large (67-100%) Necrotic Quality: Adherent Slough Treatment Notes Wound #18 (Toe Second) Wound Laterality: Right, Medial Cleanser Soap and Water Discharge Instruction: Gently cleanse wound with antibacterial soap, rinse and pat dry prior to dressing wounds Wound Cleanser Discharge  Instruction: Wash your hands with soap and water. Remove old dressing, discard into plastic bag and place into trash. Cleanse the wound with Wound Cleanser prior to applying a clean dressing using gauze sponges, not tissues or cotton balls. Do not scrub or use excessive force. Pat dry using gauze sponges, not tissue or cotton balls. Peri-Wound Care Topical Primary Dressing Silvercel Small 2x2 (in/in) Discharge Instruction: Apply Silvercel Small 2x2 (in/in) as instructed Secondary Dressing Coverlet Latex-Free Fabric Adhesive Dressings Discharge Instruction: Knuckle Secured With [name] ([id number]) [id number] Page 11 of 12 Compression Wrap Compression Stockings Add-Ons Electronic Signature(s) Signed: 01/22/2023 4:53:10 PM By: Angelina Pih Entered By: Angelina Pih on 01/22/2023 16:53:09 -------------------------------------------------------------------------------- Wound Assessment Details Patient Name: Date of Service: Chad [name], Chad [name] 01/22/2023 3:45 PM Medical Record Number: [id number] Patient Account Number: [id number] Date of Birth/Sex: Treating RN: [birth year] (62 y.o. [name] Primary Care Yaneli Keithley: [name] Other Clinician: Referring [name]: Treating Skippy Marhefka/Extender: [name] Weeks in Treatment: 58 Wound Status Wound Number: 19 Primary Venous Leg Ulcer Etiology: Wound Location: Left, Posterior Lower Leg Wound Open Wounding Event: Gradually Appeared Status: Date Acquired: 11/10/2022 Comorbid Anemia, Chronic Obstructive Pulmonary Disease (COPD), Weeks Of Treatment: 10  History: Coronary Artery Disease, Peripheral Arterial Disease, Peripheral Clustered Wound: Yes Venous Disease, Hepatitis C, Osteoarthritis, Neuropathy, Received Chemotherapy Wound Measurements Length: (cm) 3.5 Width: (cm) 2 Depth: (cm) 0.2 Area: (cm) 5.498 Volume: (cm) 1.1 % Reduction in Area: -677.7% % Reduction in Volume:  -1449.3% Epithelialization: None Tunneling: No Undermining: No Wound Description Classification: Full Thickness Without Exposed Support Structures Exudate Amount: Medium Exudate Type: Serous Exudate Color: amber Foul Odor After Cleansing: No Slough/Fibrino Yes Wound Bed Granulation Amount: None Present (0%) Necrotic Amount: Large (67-100%) Necrotic Quality: Adherent Slough Treatment Notes Wound #19 (Lower Leg) Wound Laterality: Left, Posterior Cleanser Soap and Water Discharge Instruction: Gently cleanse wound with antibacterial soap, rinse and pat dry prior to dressing wounds Wound Cleanser Discharge Instruction: Wash your hands with soap and water. Remove old dressing, discard into plastic bag and place into trash. Cleanse the wound with Wound Cleanser prior to applying a clean dressing using gauze sponges, not tissues or cotton balls. Do not scrub or use excessive force. Pat dry using gauze sponges, not tissue or cotton balls. Peri-Wound Care JAKOBI, THETFORD ([id number]) [id number] Page 12 of 12 Topical Primary Dressing Silvercel Small 2x2 (in/in) Discharge Instruction: Apply Silvercel Small 2x2 (in/in) as instructed Secondary Dressing Zetuvit Plus 4x8 (in/in) Secured With Compression Wrap UNNA BOOT - Unna-Z Zinc and Calamine Boot, 4x10 (in/yd) Discharge Instruction: Unna Paste, Kerlix and Coban from base of toes to three finger widths below bend in knee. Compression Stockings Add-Ons Electronic Signature(s) Signed: 01/22/2023 4:53:34 PM By: Angelina Pih Entered By: Angelina Pih on 01/22/2023 16:53:33

## 2023-01-23 NOTE — Progress Notes (Signed)
RENDER, Chad [name] ([id number]) [id number] Page 1 of 3 Visit Report for 01/22/2023 Physician Orders Details Patient Name: Date of Service: Chad [name], Chad [name] 01/22/2023 3:45 PM Medical Record Number: [id number] Patient Account Number: [id number] Date of Birth/Sex: Treating RN: 1961-04-28 (62 y.o. Chad [name] Primary Care Provider: Cyril [name] Other Clinician: Referring Provider: Treating Provider/Extender: Chad [name] in Treatment: 73 Verbal / Phone Orders: No Diagnosis Coding Follow-up Appointments Return Appointment in 1 week. Nurse Visit as needed Bathing/ Shower/ Hygiene May shower with wound dressing protected with water repellent cover or cast protector. No tub bath. Anesthetic (Use 'Patient Medications' Section for Anesthetic Order Entry) Lidocaine applied to wound bed Edema Control - Lymphedema / Segmental Compressive Device / Other Elevate legs to the level of the heart and pump ankles as often as possible Elevate leg(s) parallel to the floor when sitting. Other: - PATIENT MUST SLEEP IN A BED. Legs are swelling due to no elevation of legs. Off-Loading Heel suspension boot - when in bed Medications-Please add to medication list. Other: - apply AandD ointment to leg, do not apply to wound Wound Treatment Wound #12 - Ankle Wound Laterality: Left, Medial, Distal Cleanser: Soap and Water 3 x Per Week/30 Days Discharge Instructions: Gently cleanse wound with antibacterial soap, rinse and pat dry prior to dressing wounds Cleanser: Wound Cleanser 3 x Per Week/30 Days Discharge Instructions: Wash your hands with soap and water. Remove old dressing, discard into plastic bag and place into trash. Cleanse the wound with Wound Cleanser prior to applying a clean dressing using gauze sponges, not tissues or cotton balls. Do not scrub or use excessive force. Pat dry using gauze sponges, not tissue or cotton balls. Prim Dressing:  Silvercel Small 2x2 (in/in) 3 x Per Week/30 Days ary Discharge Instructions: Apply Silvercel Small 2x2 (in/in) as instructed Secondary Dressing: Zetuvit Plus 4x8 (in/in) 3 x Per Week/30 Days Compression Wrap: UNNA BOOT - Unna-Z Zinc and Calamine Boot, 4x10 (in/yd) 3 x Per Week/30 Days Discharge Instructions: Unna Paste, Kerlix and Coban from base of toes to three finger widths below bend in knee. Wound #15 - Foot Wound Laterality: Dorsal, Left Cleanser: Soap and Water 3 x Per Week/30 Days Discharge Instructions: Gently cleanse wound with antibacterial soap, rinse and pat dry prior to dressing wounds Cleanser: Wound Cleanser 3 x Per Week/30 Days Discharge Instructions: Wash your hands with soap and water. Remove old dressing, discard into plastic bag and place into trash. Cleanse the wound with Wound Cleanser prior to applying a clean dressing using gauze sponges, not tissues or cotton balls. Do not scrub or use excessive force. Pat dry using gauze sponges, not tissue or cotton balls. Prim Dressing: Silvercel Small 2x2 (in/in) ary 3 x Per Week/30 Days Chad [name], Chad [name] ([id number]) [id number] Page 2 of 3 Discharge Instructions: Apply Silvercel Small 2x2 (in/in) as instructed Secondary Dressing: Zetuvit Plus 4x8 (in/in) 3 x Per Week/30 Days Compression Wrap: UNNA BOOT - Unna-Z Zinc and Calamine Boot, 4x10 (in/yd) 3 x Per Week/30 Days Discharge Instructions: Unna Paste, Kerlix and Coban from base of toes to three finger widths below bend in knee. Wound #16 - Calcaneus Wound Laterality: Right Cleanser: Soap and Water 3 x Per Week/30 Days Discharge Instructions: Gently cleanse wound with antibacterial soap, rinse and pat dry prior to dressing wounds Cleanser: Wound Cleanser 3 x Per Week/30 Days Discharge Instructions: Wash your hands with soap and water. Remove old dressing, discard into plastic bag and place into trash. Cleanse the wound with Wound Cleanser  prior to  applying a clean dressing using gauze sponges, not tissues or cotton balls. Do not scrub or use excessive force. Pat dry using gauze sponges, not tissue or cotton balls. Prim Dressing: Silvercel Small 2x2 (in/in) 3 x Per Week/30 Days ary Discharge Instructions: Apply Silvercel Small 2x2 (in/in) as instructed Secondary Dressing: Zetuvit Plus 4x8 (in/in) 3 x Per Week/30 Days Compression Wrap: UNNA BOOT - Unna-Z Zinc and Calamine Boot, 4x10 (in/yd) 3 x Per Week/30 Days Discharge Instructions: Unna Paste, Kerlix and Coban from base of toes to three finger widths below bend in knee. Wound #17 - Lower Leg Wound Laterality: Left, Anterior Cleanser: Soap and Water 3 x Per Week/30 Days Discharge Instructions: Gently cleanse wound with antibacterial soap, rinse and pat dry prior to dressing wounds Cleanser: Wound Cleanser 3 x Per Week/30 Days Discharge Instructions: Wash your hands with soap and water. Remove old dressing, discard into plastic bag and place into trash. Cleanse the wound with Wound Cleanser prior to applying a clean dressing using gauze sponges, not tissues or cotton balls. Do not scrub or use excessive force. Pat dry using gauze sponges, not tissue or cotton balls. Prim Dressing: Silvercel Small 2x2 (in/in) 3 x Per Week/30 Days ary Discharge Instructions: Apply Silvercel Small 2x2 (in/in) as instructed Secondary Dressing: Zetuvit Plus 4x8 (in/in) 3 x Per Week/30 Days Compression Wrap: UNNA BOOT - Unna-Z Zinc and Calamine Boot, 4x10 (in/yd) 3 x Per Week/30 Days Discharge Instructions: Unna Paste, Kerlix and Coban from base of toes to three finger widths below bend in knee. Wound #18 - T Second oe Wound Laterality: Right, Medial Cleanser: Soap and Water Every Other Day/30 Days Discharge Instructions: Gently cleanse wound with antibacterial soap, rinse and pat dry prior to dressing wounds Cleanser: Wound Cleanser Every Other Day/30 Days Discharge Instructions: Wash your hands with soap  and water. Remove old dressing, discard into plastic bag and place into trash. Cleanse the wound with Wound Cleanser prior to applying a clean dressing using gauze sponges, not tissues or cotton balls. Do not scrub or use excessive force. Pat dry using gauze sponges, not tissue or cotton balls. Prim Dressing: Silvercel Small 2x2 (in/in) Every Other Day/30 Days ary Discharge Instructions: Apply Silvercel Small 2x2 (in/in) as instructed Secondary Dressing: Coverlet Latex-Free Fabric Adhesive Dressings (Generic) Every Other Day/30 Days Discharge Instructions: Knuckle Wound #19 - Lower Leg Wound Laterality: Left, Posterior Cleanser: Soap and Water 3 x Per Week/30 Days Discharge Instructions: Gently cleanse wound with antibacterial soap, rinse and pat dry prior to dressing wounds Cleanser: Wound Cleanser 3 x Per Week/30 Days Discharge Instructions: Wash your hands with soap and water. Remove old dressing, discard into plastic bag and place into trash. Cleanse the wound with Wound Cleanser prior to applying a clean dressing using gauze sponges, not tissues or cotton balls. Do not scrub or use excessive force. Pat dry using gauze sponges, not tissue or cotton balls. Prim Dressing: Silvercel Small 2x2 (in/in) 3 x Per Week/30 Days ary Discharge Instructions: Apply Silvercel Small 2x2 (in/in) as instructed Secondary Dressing: Zetuvit Plus 4x8 (in/in) 3 x Per Week/30 Days Compression Wrap: UNNA BOOT - Unna-Z Zinc and Calamine Boot, 4x10 (in/yd) 3 x Per Week/30 Days Discharge Instructions: Unna Paste, Kerlix and Coban from base of toes to three finger widths below bend in knee. Electronic Signature(s) Chad [name], Chad [name] ([id number]) [id number] Page 3 of 3 Signed: 01/22/2023 4:54:49 PM By: Angelina Pih Signed: 01/23/2023 1:40:48 PM By: [name] PA-C Entered By: Angelina Pih on 01/22/2023  16:54:48 -------------------------------------------------------------------------------- SuperBill Details Patient Name: Date of Service: Chad [name], Chad [name] 01/22/2023 Medical Record Number: [id number] Patient Account Number: [id number] Date of Birth/Sex: Treating RN: [birth year] (62 y.o. 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 Modifier: Quantity: 1 Electronic Signature(s) Signed: 01/22/2023 4:55:40 PM By: Angelina Pih Signed: 01/23/2023 1:40:48 PM By: [name] PA-C Entered By: Angelina Pih on 01/22/2023 16:55:39

## 2023-01-27 ENCOUNTER — Encounter: Payer: Medicaid Other | Attending: Physician Assistant | Admitting: Physician Assistant

## 2023-01-27 DIAGNOSIS — L89613 Pressure ulcer of right heel, stage 3: Secondary | ICD-10-CM | POA: Insufficient documentation

## 2023-01-27 DIAGNOSIS — L97322 Non-pressure chronic ulcer of left ankle with fat layer exposed: Secondary | ICD-10-CM | POA: Insufficient documentation

## 2023-01-27 DIAGNOSIS — L98492 Non-pressure chronic ulcer of skin of other sites with fat layer exposed: Secondary | ICD-10-CM | POA: Insufficient documentation

## 2023-01-27 DIAGNOSIS — I87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 hypertension (idiopathic) with ulcer and inflammation of left lower extremity: Secondary | ICD-10-CM | POA: Diagnosis present

## 2023-01-27 DIAGNOSIS — C689 Malignant neoplasm of urinary organ, unspecified: Secondary | ICD-10-CM | POA: Insufficient documentation

## 2023-01-27 DIAGNOSIS — I69354 Hemiplegia and hemiparesis following cerebral infarction affecting left non-dominant side: Secondary | ICD-10-CM | POA: Diagnosis not present

## 2023-01-27 NOTE — Progress Notes (Addendum)
LARWENCE, DUANE ([id number]) [id number] Page 1 of 20 Visit Report for 01/27/2023 Chief Complaint Document Details Patient Name: Date of Service: Chad [name], Chad [name] 01/27/2023 12:15 PM Medical Record Number: [id number] Patient Account Number: [id number] Date of Birth/Sex: Treating RN: 12-02-1960 (62 y.o. Chad [name] Primary Care Provider: [name] Other Clinician: Referring Provider: Treating Provider/Extender: [name] Weeks in Treatment: 17 Information Obtained from: Patient Chief Complaint Left ankle ulcer, Left dorsal foot wound, right heel Electronic Signature(s) Signed: 01/27/2023 12:36:48 PM By: [name] PA-C Entered By: [name] on 01/27/2023 12:36:48 -------------------------------------------------------------------------------- Debridement Details Patient Name: Date of Service: Chad [name], [name] 01/27/2023 12:15 PM Medical Record Number: [id number] Patient Account Number: [id number] Date of Birth/Sex: Treating RN: 1961-03-20 (62 y.o. Chad [name] Primary Care Provider: [name] Other Clinician: Referring Provider: Treating Provider/Extender: [name] Weeks in Treatment: 59 Debridement Performed for Assessment: Wound #12 Left,Distal,Medial Ankle Performed By: Physician [name], PA-C Debridement Type: Debridement Severity of Tissue Pre Debridement: Fat layer exposed Level of Consciousness (Pre-procedure): Awake and Alert Pre-procedure Verification/Time Out Yes - 13:11 Taken: Start Time: 13:11 Pain Control: Lidocaine 4% T opical Solution Percent of Wound Bed Debrided: 100% T Area Debrided (cm): otal 1.26 Tissue and other material debrided: Viable, Non-Viable, Slough, Subcutaneous, Slough Level: Skin/Subcutaneous Tissue Debridement Description: Excisional Instrument: Curette Bleeding: Moderate Hemostasis Achieved: Pressure Procedural Pain: 0 Post Procedural Pain: 0 TRAVIAN, BADENHOP  ([id number]) [id number] Page 2 of 20 Response to Treatment: Procedure was tolerated well Level of Consciousness (Post- Awake and Alert procedure): Post Debridement Measurements of Total Wound Length: (cm) 2.3 Width: (cm) 0.7 Depth: (cm) 0.2 Volume: (cm) 0.253 Character of Wound/Ulcer Post Debridement: Stable Severity of Tissue Post Debridement: Fat layer exposed Post Procedure Diagnosis Same as Pre-procedure Electronic Signature(s) Signed: 01/27/2023 5:13:07 PM By: [name] PA-C Signed: 02/02/2023 5:27:27 PM By: Midge Aver MSN RN CNS WTA Entered By: Midge Aver on 01/27/2023 13:15:28 -------------------------------------------------------------------------------- Debridement Details Patient Name: Date of Service: Chad [name], Chad [name] 01/27/2023 12:15 PM Medical Record Number: [id number] Patient Account Number: [id number] Date of Birth/Sex: Treating RN: 1961/03/13 (62 y.o. Chad [name] Primary Care Provider: [name] Other Clinician: Referring Provider: Treating Provider/Extender: [name] in Treatment: 59 Debridement Performed for Assessment: Wound #19 Left,Posterior Lower Leg Performed By: Physician [name], PA-C Debridement Type: Debridement Severity of Tissue Pre Debridement: Fat layer exposed Level of Consciousness (Pre-procedure): Awake and Alert Pre-procedure Verification/Time Out Yes - 13:11 Taken: Start Time: 13:11 Pain Control: Lidocaine 4% T opical Solution Percent of Wound Bed Debrided: 100% T Area Debrided (cm): otal 3.53 Tissue and other material debrided: Viable, Non-Viable, Slough, Subcutaneous, Slough Level: Skin/Subcutaneous Tissue Debridement Description: Excisional Instrument: Curette Bleeding: Moderate Hemostasis Achieved: Pressure Procedural Pain: 0 Post Procedural Pain: 0 Response to Treatment: Procedure was tolerated well Level of Consciousness (Post- Awake and Alert procedure): Post  Debridement Measurements of Total Wound Length: (cm) 3 Width: (cm) 1.5 Depth: (cm) 0.1 Volume: (cm) 0.353 Character of Wound/Ulcer Post Debridement: Stable Severity of Tissue Post Debridement: Fat layer exposed Post Procedure Diagnosis Same as Pre-procedure [name] ([id number]) [id number] Page 3 of 20 Electronic Signature(s) Signed: 01/27/2023 5:13:07 PM By: [name] PA-C Signed: 02/02/2023 5:27:27 PM By: Midge Aver MSN RN CNS WTA Entered By: Midge Aver on 01/27/2023 13:18:09 -------------------------------------------------------------------------------- Debridement Details Patient Name: Date of Service: Chad [name] 01/27/2023 12:15 PM Medical Record Number: [id number] Patient Account Number: [id number] Date of Birth/Sex: Treating RN: 1961/04/02 (62 y.o. Chad [name] Primary Care Provider:  [name] Other Clinician: Referring Provider: Treating Provider/Extender: [name] Weeks in Treatment: 59 Debridement Performed for Assessment: Wound #18 Right,Medial T Second oe Performed By: Physician [name], PA-C Debridement Type: Debridement Level of Consciousness (Pre-procedure): Awake and Alert Pre-procedure Verification/Time Out Yes - 13:11 Taken: Start Time: 13:11 Pain Control: Lidocaine 4% T opical Solution Percent of Wound Bed Debrided: 100% T Area Debrided (cm): otal 0.39 Tissue and other material debrided: Viable, Non-Viable, Slough, Subcutaneous, Slough Level: Skin/Subcutaneous Tissue Debridement Description: Excisional Instrument: Curette Bleeding: Moderate Hemostasis Achieved: Pressure Procedural Pain: 0 Post Procedural Pain: 0 Response to Treatment: Procedure was tolerated well Level of Consciousness (Post- Awake and Alert procedure): Post Debridement Measurements of Total Wound Length: (cm) 1 Stage: Category/Stage II Width: (cm) 0.5 Depth: (cm) 0.1 Volume: (cm) 0.039 Character of  Wound/Ulcer Post Debridement: Stable Post Procedure Diagnosis Same as Pre-procedure Electronic Signature(s) Signed: 01/27/2023 5:13:07 PM By: [name] PA-C Signed: 02/02/2023 5:27:27 PM By: Midge Aver MSN RN CNS WTA Entered By: Midge Aver on 01/27/2023 13:18:42 [name] ([id number]) [id number] Page 4 of 20 -------------------------------------------------------------------------------- Debridement Details Patient Name: Date of Service: Chad [name], Chad [name] 01/27/2023 12:15 PM Medical Record Number: [id number] Patient Account Number: [id number] Date of Birth/Sex: Treating RN: [birth year] (62 y.o. Chad [name] Primary Care Provider: [name] Other Clinician: Referring Provider: Treating Provider/Extender: [name] Weeks in Treatment: 59 Debridement Performed for Assessment: Wound #16 Right Calcaneus Performed By: Physician [name], PA-C Debridement Type: Debridement Level of Consciousness (Pre-procedure): Awake and Alert Pre-procedure Verification/Time Out Yes - 13:11 Taken: Start Time: 13:11 Pain Control: Lidocaine 4% T opical Solution Percent of Wound Bed Debrided: 100% T Area Debrided (cm): otal 1.88 Tissue and other material debrided: Viable, Non-Viable, Slough, Subcutaneous, Slough Level: Skin/Subcutaneous Tissue Debridement Description: Excisional Instrument: Curette Bleeding: Moderate Hemostasis Achieved: Pressure Procedural Pain: 0 Post Procedural Pain: 0 Response to Treatment: Procedure was tolerated well Level of Consciousness (Post- Awake and Alert procedure): Post Debridement Measurements of Total Wound Length: (cm) 2 Stage: Category/Stage III Width: (cm) 1.2 Depth: (cm) 0.2 Volume: (cm) 0.377 Character of Wound/Ulcer Post Debridement: Stable Post Procedure Diagnosis Same as Pre-procedure Electronic Signature(s) Signed: 01/27/2023 5:13:07 PM By: [name] PA-C Signed: 02/02/2023 5:27:27 PM By:  Midge Aver MSN RN CNS WTA Entered By: Midge Aver on 01/27/2023 13:19:30 -------------------------------------------------------------------------------- Debridement Details Patient Name: Date of Service: Chad [name], Chad [name] 01/27/2023 12:15 PM Medical Record Number: [id number] Patient Account Number: [id number] Date of Birth/Sex: Treating RN: [birth year] (62 y.o. Athanasios, Rindlisbacher, [name] ([id number]) [id number] Page 5 of 20 Primary Care Provider: [name] Other Clinician: Referring Provider: Treating Provider/Extender: [name] in Treatment: 59 Debridement Performed for Assessment: Wound #17 Left,Anterior Lower Leg Performed By: Physician [name], PA-C Debridement Type: Debridement Level of Consciousness (Pre-procedure): Awake and Alert Pre-procedure Verification/Time Out Yes - 13:11 Taken: Start Time: 13:11 Pain Control: Lidocaine 4% T opical Solution Percent of Wound Bed Debrided: 100% T Area Debrided (cm): otal 6.87 Tissue and other material debrided: Viable, Non-Viable, Slough, Subcutaneous, Slough Level: Skin/Subcutaneous Tissue Debridement Description: Excisional Instrument: Curette Bleeding: Moderate Hemostasis Achieved: Silver Nitrate Procedural Pain: 0 Post Procedural Pain: 0 Response to Treatment: Procedure was tolerated well Level of Consciousness (Post- Awake and Alert procedure): Post Debridement Measurements of Total Wound Length: (cm) 3.5 Stage: Category/Stage II Width: (cm) 2.5 Depth: (cm) 0.2 Volume: (cm) 1.374 Character of Wound/Ulcer Post Debridement: Stable Post Procedure Diagnosis Same as Pre-procedure Electronic Signature(s) Signed: 01/27/2023 5:13:07 PM By: [name]  PA-C Signed: 02/02/2023 5:27:27 PM By: Midge Aver MSN RN CNS WTA Entered By: Midge Aver on 01/27/2023 13:21:37 -------------------------------------------------------------------------------- HPI Details Patient  Name: Date of Service: Chad [name] 01/27/2023 12:15 PM Medical Record Number: [id number] Patient Account Number: [id number] Date of Birth/Sex: Treating RN: 06-23-1961 (62 y.o. Chad [name] Primary Care Provider: [name] Other Clinician: Referring Provider: Treating Provider/Extender: [name] in Treatment: 31 History of Present Illness HPI Description: 10/08/18 on evaluation today patient actually presents to our office for initial evaluation concerning wounds that he has of the bilateral lower extremities. 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 He also has bilateral venous stasis. He apparently has been homeless currently following discharge from the hospital apparently he has been placed at [facility] which is is a skilled nursing facility locally. Nonetheless fortunately he does not show any signs of infection at this time which is good news. In fact several of the wound actually appears to be showing some signs of improvement already in my pinion. There are a couple areas in the left leg in particular there likely gonna require some sharp debridement to help clear away some necrotic tissue and help with more sufficient healing. No fevers, chills, nausea, or vomiting noted at this time. 10/15/18 on evaluation today patient actually appears to be doing very well in regard to his bilateral lower extremities. He's been tolerating the dressing changes without complication. Fortunately there does not appear to be any evidence of active infection at this time which is great news. Overall I'm actually very EMILIEN, BREDOW ([id number]) [id number] Page 6 of 20 pleased with how this has progressed in just one visits time. Readmission: 08/14/2020 upon evaluation today patient  presents for re-evaluation here in our clinic. He is having issues with his left ankle region as well as his right toe and his right heel. He tells me that the toe and heel actually began as a area that was itching that he was scratching and then subsequently opened up into wounds. These may have been abscess areas I presume based on what I am seeing currently. With regard to his left ankle region he tells me this was a similar type occurrence although he does have venous stasis this very well may be more of a venous leg ulcer more than anything. Nonetheless I do believe that the patient would benefit from appropriate and aggressive wound care to try to help get things under better control here. He does have history of a stroke on the left side affecting him to some degree there that he is able to stand although he does have some residual weakness. Otherwise again the patient does have chronic venous insufficiency as previously noted. His arterial studies most recently obtained showed that he had an ABI on the right of 1.16 with a TBI of 0.52 and on the left and ABI of 1.14 with a TBI of 0.81. That was obtained on 06/19/2020. 08/28/2020 upon evaluation today patient appears to be doing decently well in regard to his wounds in general. He has been tolerating the dressing changes without complication. Fortunately there does not appear to be any signs of active infection which is great news. With that being said I think the [facility] is doing a good job I would recommend that we likely continue with that currently. 09/11/2020 upon evaluation  today patient's wounds did not appear to be doing too poorly but again he is not really showing signs of significant improvement with regard to any of the wounds on the right. None of them have Hydrofera Blue on them I am not exactly sure why this is not being followed as the facility did not contact us to let us know of any issues with obtaining dressings or  otherwise. With that being said he is supposed to be using Hydrofera Blue on both of the wounds on the right foot as well as the ankle wound on the left side. 09/18/2020 upon evaluation today patient appears to be doing poorly with regard to his wounds. Again right now the left ankle in particular showed signs of extreme maceration. Apparently he was told by someone with staff at [facility] they could not get the [facility]. With that being said this is something that is never been relayed to [place] one way or another. Also the patient subsequently has not supposed to have a border gauze dressing on. He should have an ABD pad and roll gauze to secure as this drains much too much just to have a border gauze dressing to cover. Nonetheless the fact that they are not using the appropriate dressing is directly causing deterioration of the left ankle wound it is significantly worse today compared to what it was previous. I did attempt to call [facility] while the patient was here I called three times and got no one to even pick up the phone. After this I had my for an office coordinator call and she was able to finally get through and leave a message with the D ON as of dictation of this note which is roughly about an hour and a half later I still have not been able to speak with anyone at the facility. 09/25/2020 upon evaluation today patient actually showing signs of good improvement which is excellent news. He has been tolerating the dressing changes without complication. Fortunately there is no signs of active infection which is great news. No fevers, chills, nausea, vomiting, or diarrhea. I do feel like the facility has been doing a much better job at taking care of him as far as the dressings are concerned. However the director of nursing never did call me back. 10/09/2020 upon evaluation today patient appears to be doing well with regard to his wound. The toe ulcer did require some  debridement but the other 2 areas actually appear to be doing quite well. 10/19/2020 upon evaluation today patient actually appears to be doing very well in regard to his wounds. In fact the heel does appear to be completely healed. The toe is doing better in the medial ankle on the left is also doing better. Overall I think he is headed in the right direction. 10/26/2020 upon evaluation today patient appears to be doing well with regard to his wound. He is showing signs of improvement which is great news and overall I am very pleased with where things stand today. No fevers, chills, nausea, vomiting, or diarrhea. 11/02/2020 upon evaluation today patient appears to be doing well with regard to his wounds. He has been tolerating the dressing changes without complication overall I am extremely pleased with where things stand today. He in regard to the toe is almost completely healed and the medial ankle on the left is doing much better. 11/09/2020 upon evaluation today patient appears to be doing a little poorly in regard to his left medial  ankle ulcer. Fortunately there does not appear to be any signs of systemic infection but unfortunately locally he does appear to be infected in fact he has blue-green drainage consistent with Pseudomonas. 11/16/2020 upon evaluation today patient appears to be doing well with regard to his wound. It actually appears to be doing better. I did place him on gentamicin cream since the Cipro was actually resistant even though he was positive for Pseudomonas on culture. Overall I think that he does seem to be doing better though I am unsure whether or not they have actually been putting the cream on. The patient is not sure that we did talk to the nurse directly and she was going to initiate that treatment. Fortunately there does not appear to be any signs of active infection at this time. No fevers, chills, nausea, vomiting, or diarrhea. 4/28; the area on the right second toe is  close to healed. Left medial ankle required debridement 12/07/2020 upon evaluation today patient appears to be doing well with regard to his wounds. In fact the right second toe appears to be completely healed which is great news. Fortunately there does not appear to be any signs of active infection at this time which is also great news. I think we can probably discontinue the gentamicin on top of everything else. 12/14/2020 upon evaluation today patient appears to be doing well with regard to his wound. He is making good progress and overall very pleased with where things stand today. There is no signs of active infection at this time which is great news. 12/28/2020 upon evaluation today patient appears to be doing well with regard to his wounds. He has been tolerating the dressing changes without complication. Fortunately there is no signs of active infection at this time. No fevers, chills, nausea, vomiting, or diarrhea. 12/28/2020 upon evaluation today patient's wound bed actually showed signs of excellent improvement. He has great epithelization and granulation I do not see any signs of infection overall I am extremely pleased with where things stand at this point. No fevers, chills, nausea, vomiting, or diarrhea. 01/11/2021 upon evaluation today patient appears to be doing well with regard to his wound on his leg. He has been tolerating the dressing changes without complication. Fortunately there does not appear to be any signs of active infection which is great news. No fevers, chills, nausea, vomiting, or diarrhea. 01/25/2021 upon evaluation today patient appears to be doing well with regard to his wound. He has been tolerating the dressing changes without complication. Fortunately the collagen seems to be doing a great job which is excellent news. No fevers, chills, nausea, vomiting, or diarrhea. 02/08/2021 upon evaluation today patient's wound is actually looking a little bit worse especially in the  periwound compared to previous. Fortunately there does not appear to be any signs of infection which is great news with that being said he does have some irritation around the periphery of the wound which has me more concerned. He actually had a dressing on that had not been changed in 3 days. He also is supposed to have daily dressing changes. With regard to the dressing applied he had a silver alginate dressing and silver collagen is what is recommended and ordered. He also had no Desitin around the edges of the wound in the periwound region although that is on the order inspect to be done as well. In general I was very concerned I did contact [facility] actually spoke with Verlon Au who is the scheduling individual and  subsequently she stated that she would pass the information to the D ON apparently the D ON was not available to talk to me when I call today. 02/18/2021 upon evaluation today patient's wound is actually showing signs of improvement. Fortunately there does not appear to be any evidence of infection which is great news overall I am extremely pleased with where things stand today. No fevers, chills, nausea, vomiting, or diarrhea. 8/3; patient presents for 1 week follow-up. He has no issues or complaints today. He denies signs of infection. 03/11/2021 upon evaluation today patient appears to be doing well with regard to his wound. He does have a little bit of slough noted on the surface of the wound but fortunately there does not appear to be any signs of active infection at this time. No fevers, chills, nausea, vomiting, or diarrhea. LEVAUGHN, WOITAS ([id number]) [id number] Page 7 of 20 03/18/2021 upon evaluation today patient appears to be doing well with regard to his wound. He has been tolerating the dressing changes without complication. There was a little irritation more proximal to where the wound was that was not noted last week but nonetheless this  is very superficial just seems to be more irritation we just need to make sure to put a good amount of the zinc over the area in my opinion. Otherwise he does not seem to be doing significantly worse at all which is great news. 03/25/2021 upon evaluation today patient appears to be doing well with regard to his wound. He is going require some sharp debridement today to clear with some of the necrotic debris. I did perform this today without complication postdebridement wound bed appears to be doing much better this is great news. 04/08/2021 upon evaluation today patient appears to be doing decently well in regard to his wound although the overall measurement is not significantly smaller compared to previous. It is gone down a little bit but still the facility continues to not really put the appropriate dressings in place in fact he was supposed to have collagen we think he probably had more of an allergy to At this point. Fortunately there does not appear to be any signs of active infection systemically though locally I do not see anything on initial visualization either as far as erythema or warmth. 04/15/2021 upon evaluation today patient appears to be doing well with regard to his wound. He is actually showing signs of improvement. I did place him on antibiotics last week, Cipro. He has been taking that 2 times a day and seems to be tolerating it very well. I do not see any evidence of worsening and in fact the overall appearance of the wound is smaller today which is also great news. 9/26; left medial ankle chronic venous insufficiency wound is improved. Using Hydrofera Blue 10/10; left medial ankle chronic venous insufficiency. Wound has not changed much in appearance completely nonviable surface. Apparently there have been problems getting the right product on the wound at the facility although he came in with [facility] on today 05/14/2021 upon evaluation today patient appears to be doing  well with regard to his wound. I think he is making progress here which is good news. Fortunately there does not appear to be any signs of active infection at this time. No fevers, chills, nausea, vomiting, or diarrhea. 05/20/2021 upon evaluation today patient appears to be doing well with regard to his wound. He is showing signs of good improvement which is great news. There does not appear to  be any evidence of active infection which is also excellent news. No fevers, chills, nausea, vomiting, or diarrhea. 05/28/2021 upon evaluation today patient appears to be doing quite well. There does not appear to be any signs of active infection at this time which is great news. Overall I am extremely pleased with where things stand today. I think he is headed in the right direction. 06/11/2021 upon evaluation today patient appears to be doing well with regard to his left ankle ulcer and poorly in regard to the toe ulcer on the second toe right foot. This appears to show signs of joint exposure. Apparently this has been present for 1 to 2 months although he kept forgetting to tell me about it. That is unfortunate as right now it definitely appears to be doing significantly worse than what I would like to see. There does not appear to be any signs of active infection systemically though locally I am concerned about the possibility of infection the toe is quite red. Again no one from the facility ever contacted [place] to advise that this was going on in the interim either. 06/17/2021 upon evaluation today patient presents for follow-up I did review his x-ray which showed a navicular bone fracture I am unsure of the chronicity of this. Subsequently he also had osteomyelitis of the toe which was what I was more concerned about this did not show up on x-ray but did show up on the pathology scrapings. This was listed as acute osteomyelitis. Nonetheless at this point I think that the antibiotic treatment is the best regimen  to go with currently. The patient is in agreement with that plan. Nonetheless he has initially 30 days of doxycycline off likely extend that towards the end of the treatment cycle that will be around the middle of December for an additional 2 weeks. That all depends on how well he continues to heal. Nonetheless based on what I am seeing in the foot I did want a proceed with an MRI as well which I think will be helpful to identify if there is anything else that needs to be addressed from the standpoint of infection. 06/24/2021 upon evaluation today patient appears to be doing pretty well in regards to his wounds. I think both are actually showing signs of improvement which is good I did review his MRI today which did show signs of osteomyelitis of the middle and proximal phalanx on his right foot of the affected toe. With that being said this is actually showing signs of significant improvement today already with the antibiotic therapy I think the redness is also improved. Overall I think that we just need to give this some time with appropriate wound care we will see how things go potentially hyperbarics could be considered. 07/02/2021 upon inspection today patient actually appears to be doing well in regard to his left ankle which is getting very close to complete resolution of pleased in that regard. Unfortunately he is continuing to have issues with his second toe right foot and this seems to still be very painful for him. Recommend he try something different from the standpoint of antibiotics. 07/15/2021 upon evaluation today patient appears to be doing actually pretty well in regard to his foot. This is actually showing signs of significant improvement which is great news. Overall I feel like the patient is improving both in regard to the second toe as well as the ankle on the left. With that being said the biggest issue that I do see  currently is that he is needing to have a refill of the  doxycycline that we previously treated him with. He also did see podiatry they are not going to recommend any amputation at this point since he seems to be doing quite well. For that reason we just need to keep things under control from an infection standpoint. 08/01/2021 upon evaluation today patient appears to be doing well with regard to his wound. He has been tolerating the dressing changes without complication. Fortunately there does not appear to be any evidence of active infection locally nor systemically at this point. In fact I think everything is doing excellent in fact his second toe on the right foot is almost healed and the ankle on the left ankle region is actually very close to being healed as well. 08/08/2021 upon evaluation today patient appears to be doing well with regard to his wound. He has been tolerating the dressing changes without complication. Fortunately I do not see any signs of active infection at this time. Readmission: 12-06-2021 upon evaluation today patient presents for reevaluation here in the clinic he does tell me that he was being seen in facility at [facility] by a provider that was coming in. He is not sure who this was. He tells me however that the wound seems to have gotten worse even compared to where it was when we last saw him at this point. With that being said I do believe that he is likely going need ongoing wound care here in the clinic and I do believe that we need to be the ones to frontline this since his wound does seem to be getting worse not better at this point. He voiced understanding. He is also in agreement with this plan and feels more comfortable coming here she tells me. Patient's medical history really has not changed since his prior admission he was only gone since January. 12-27-2021 upon evaluation today patient appears to be doing well with regard to his wound they did run out of the [facility] so they did not put anything  on just an ABD pad with gentamicin. Still we are seeing some signs of good improvement here with some new epithelization which is great news. 01-10-2022 upon evaluation today patient appears to be doing well with regard to his wounds and he is going require some sharp debridement but overall seems to be making good progress. Fortunately I do not see any evidence of active infection locally or systemically at this time which is great news. 01-24-2022 upon evaluation today patient appears to be doing well with regard to his wound. The facility actually came and dropped him off early and he had another appointment at the hospital and then they just brought him over here and this was still hours before his appointment this afternoon. For that reason we did do our best to work him in this morning and fortunately had some space to make this happen. With that being said patient's wound does seem to be making progress here and I am very pleased in that regard I do not see any signs of active infection locally or systemically at this time. 02-07-2022 patient appears to be doing well currently in regard to his wounds. In fact one of them the more proximal is healed the distal is still open but seems to be doing excellent. Fortunately I do not see any evidence of active infection locally or systemically at this time which is great news. No fevers, chills, nausea,  vomiting, or diarrhea. CHALEN, KUCHENBECKER ([id number]) [id number] Page 8 of 20 7/27; left medial ankle venous. Improving per our intake nurse. We are using Hydrofera Blue under Tubigrip compression. They are changing that at his facility 03-06-2022 upon evaluation today patient appears to be doing well with regard to the his wound he is can require some sharp debridement but seems to be making excellent progress. Fortunately I do not see any evidence of active infection locally or systemically which is great news. 03-27-2022 upon  evaluation today patient's wound is actually showing signs of excellent improvement. Fortunately I see no signs of active infection locally or systemically at this time which is great news. No fevers, chills, nausea, vomiting, or diarrhea. 04-21-2022 upon evaluation today patient appears to be doing excellent in regard to his wound in fact this is very close to resolution based on what I am seeing. I do not see any evidence of active infection locally or systemically at this time which is great news and overall I am extremely pleased with where we are today. 05-05-2022 upon evaluation today patient's wound actually appears to potentially be completely healed. Fortunately I do not see any evidence of active infection at this time which is great news and overall I am very pleased I think this needs a little time to toughen up but other than that I really do believe were doing quite well. He is very pleased to hear this its been a long time coming. 05-19-2022 upon evaluation today patient appears to be doing well currently in regard to his wound. He in fact we were hoping will be completely healed and closed but it still has a very tiny area right in the center which is still continuing to drain. Fortunately I do not see any signs of infection locally or systemically which is great news. 11/6; this wound is close to completely" he comes in this week with some erythema at 1 edge of this which looks like something was rubbing on the area. Other than that no evidence of infection 06-16-2022 upon evaluation today patient appears to be doing well currently in regard to his ankle ulcer. This is very close to being healed but still has a small area in the central portion which is not. Fortunately there does not appear to be any signs of infection locally or systemically which is great news. No fevers, chills, nausea, vomiting, or diarrhea. 06-30-2022 upon evaluation patient's wound pretty much appears to be  almost completely closed. In fact it may even be closed but there are still a lot of skin irritation around the edges of the wound and I just do not feel great about releasing them yet I would like to continue to monitor this just a little bit longer before getting to that point. He is not opposed to this and in fact is happy to continue to come in if need be in order to make sure that things are moving in the right direction he definitely does not want to backtrack. 07-14-2022 upon evaluation today patient appears to be doing well currently in regard to his wound. Has been tolerating the dressing changes without complication. Fortunately I see no evidence of active infection locally nor systemically which is great news. No fevers, chills, nausea, vomiting, or diarrhea. 08-11-2022 upon evaluation today patient appears to be doing a little worse than last time I saw him although it has been a month. Unfortunately he was not able to be seen due to the fact that he  was quarantined in the facility with COVID. Subsequently he tells me that he mention to the facility staff several times about taking it off over the past several weeks but they continue to tell him that someone have to get in touch with [place] here although nobody ever did until Friday when the nurse manager called to have that documented in the communication notes. With that being said unfortunately this means that he had a wrap on that he was supposed to have on for only 1 week for ended up being on four 1 month essentially. I am glad that there is nothing worse than what we see currently to be perfectly honest. 08-19-2022 upon evaluation today patient appears to be doing well currently in regard to his wounds. He has been tolerating the dressing changes without complication. This is actually smaller today which is great news after last week's reopening. Fortunately I do not see any evidence of infection locally nor systemically at this  point. 08-26-2022 upon evaluation today patient appears to be doing well currently in regard to his wound. He has been tolerating the dressing changes without complication. Fortunately there does not appear to be any signs of active infection locally nor systemically which is great news. No fevers, chills, nausea, vomiting, or diarrhea. 09-02-2022 upon evaluation today patient's wound actually showing signs of excellent improvement this is measuring smaller and looking much better. I am very pleased with where we stand I do believe that we are headed in the right direction. 09-09-2022 upon evaluation today patient appears to be doing decently well in regard to his leg in general although there was some swelling this is the 1 thing that I am not too happy with based on what I see currently. Fortunately there does not appear to be any signs of infection locally nor systemically at this point. 09-16-2022 upon evaluation today patient appears to be doing well currently in regard to his wound. He is actually showing signs of improvement he wore the Tubigrip this week and it significantly better compared to last week's evaluation. Fortunately I do not see any evidence of active infection locally or systemically which is great news. Overall I think that were headed in the right direction which is great news. In fact overall I think this is the best that he has looked in some time. He does not like the Tubigrip but actually did extremely well with this in my opinion. I think he needs to continue to utilize this. 09-23-2022 upon evaluation today patient appears to be doing well currently in regard to his wound. He has been tolerating the dressing changes without complication. Fortunately there does not appear to be any signs of active infection locally nor systemically which is great news and in general I do feel like that we are headed in the right direction. He has been having trouble with the Tubigrip however we  have gotten the facility to order compression socks he has not gotten them as of yet. 3/4; patient is about the same with a wound on the left posterior leg. This is a venous wound. He needs more compression on this leg but he took off the previous wraps we are using. I am not certain whether he is using juxta lite stockings at the nursing home. 3/12; patient presents for follow-up. He unfortunately has 2 new wounds 1 to the dorsal left side and the other to the right heel. He is not sure how these happened. The right heel appears to be  caused by pressure. He has been using Hydrofera Blue to the original wound on the left leg. 10-14-2022 upon evaluation today patient appears to be doing well currently in regard to his original wound but unfortunately he has several new wounds which I was completely unaware of before walking into the room to see him today. Unfortunately he seems to not be doing nearly as well as what I saw when I last had an appointment with him 3 weeks ago. Fortunately I do not see any signs of systemic infection though unfortunately locally he has definite infection of the left lower extremity. He is currently on doxycycline I am going to likely have to extend that today. 10-21-2022 upon evaluation today patient appears to be doing well currently in regard to his wounds. He seems to be tolerating the dressing changes without complication. Fortunately there does not appear to be any signs of active infection locally or systemically which is great news and in general I definitely feel like we are on the right track here. 10-30-2022 upon evaluation today patient still continues to not be doing too well at all with regard to his wounds. Unfortunately he is still having signs of infection as well which also has been concerned. Fortunately I do not see any signs of active infection locally nor systemically at this time. No fevers, chills, nausea, vomiting, or diarrhea. 11-06-2022 upon  evaluation today patient appears to be doing okay currently in regard to his wounds. All things considered with his blood flow not being nearly as good as what should I think that he is making pretty good progress here. Fortunately I do not see any signs of infection locally nor systemically which is great news. No fevers, chills, nausea, vomiting, or diarrhea. BLAIDEN, LAC ([id number]) [id number] Page 9 of 20 11-13-2022 upon evaluation today patient appears to be doing poorly currently in regard to his wounds. Things seem to be getting a little bit worse not better. 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 Subsequently he is also going to need to have vascular intervention but we are unsure exactly when this is going on the undertaking. I would try to actually send a message to Dr. Hazle Quant and the vascular team just to see what exactly the plan is going forward. The patient is in agreement with that plan I just think we need to have a plan especially in light of the fact the left leg seems to be getting a little bit worse at this point. 11-25-2022 upon evaluation today patient is currently now discharged from the hospital following his colostomy procedure. That did very well and he seems to be doing well. Fortunately there does not appear to be any signs of active infection locally or systemically which is great news. No fevers, chills, nausea, vomiting, or diarrhea. 12-04-2022 upon evaluation today patient appears to be doing actually decently well in regard to his wounds and actually very pleased with where we stand and I think that he is making some pretty good progress here. Fortunately I do not see any signs of active infection locally nor systemically which is great news. No fevers, chills, nausea, vomiting, or diarrhea. 12-12-2022 upon evaluation today patient appears  to be doing well currently in regard to his wound. He has been tolerating the dressing changes without complication. The wounds all seem to be making some progress to a degree. He has  appointment with vascular on the 23rd of this month. 5/24; the patient was scheduled for a left lower extremity angiogram yesterday but apparently they were overbooked and they wanted to admit him to hospital last night so they could do this early this morning but he refused I see that Dr. Wyn Quaker has rescheduled him for June 6. He is using silver alginate to wounds on his bilateral lower extremities. 01-02-2023 upon evaluation today patient appears to be doing poorly in regard to his wounds still. Subsequently he did have his angiogram but apparently there really was not much that was found that could be revascularized. The patient tells me that Dr. Wyn Quaker was going to contact me here in the office I have not heard from him as of yet and I may see about giving him a call just to follow-up or possibly send a message through epic which could be an option as well. 01-09-2023 upon evaluation today patient appears to be doing poorly in regard to his legs he is extremely swollen. He has been sleeping in his chair he tells me for the past week. His bed is broken and they have ordered a new one which is supposedly supposed to be there today. With that being said he the way he needs to be not sleeping in his chair that is his wheelchair every day and through the night. This is causing his legs to be much more swollen and it seen and how badly wounds are doing at this point. This has made things significantly worse. Fortunately I do not see any signs of infection right now unfortunately I do believe that the patient has a tremendous amount of drainage currently. 01-19-2023 upon evaluation today patient appears to be doing poorly in regard to his wounds of his legs his swelling is better but still not as good as what we would like to see.  Fortunately I do not see any evidence of active infection locally nor systemically which is great news and in general I do believe that we are moving in the right direction here. Nonetheless it would be better if she was in the bed sleeping or not send up in his wheelchair although he seems to still be set up in the wheelchair at this time. He tells me that he is just not as comfortable in the bed as the wheelchair but unfortunately this is causing damage to his legs which I discussed with him today as well. Nonetheless I am not sure that he is going to be amendable to doing anything different. 01-27-2023 upon evaluation today patient appears to be doing well currently. Fortunately I do not see any evidence of active infection locally nor systemically which is great news I think he is actually doing the best option and while in fact I would try to see if we can do some debridement today to get things moving along. Electronic Signature(s) Signed: 01/27/2023 1:25:52 PM By: [name] PA-C Entered By: [name] on 01/27/2023 13:25:52 -------------------------------------------------------------------------------- Physical Exam Details Patient Name: Date of Service: Chad [name], Chad [name] 01/27/2023 12:15 PM Medical Record Number: [id number] Patient Account Number: [id number] Date of Birth/Sex: Treating RN: 1960-08-22 (62 y.o. Chad [name] Primary Care Provider: [name] Other Clinician: Referring Provider: Treating Provider/Extender: [name] Weeks in Treatment: 26 Constitutional Well-nourished and well-hydrated in no acute distress. Respiratory normal breathing without difficulty. Psychiatric this patient is able to make decisions and demonstrates good insight into disease process. Alert and Oriented x 3. pleasant and  cooperative. Notes Upon inspection bed actually showed signs of good granulation and epithelization. He is actually showing some signs of improvement here which  is good news and I think that along with Unna boot wraps that he is doing well with the dressing changes and I think that his wound is significantly improved at all locations compared to last week. I am going to try to do some debridement I did perform debridement today and he had some bleeding in the left patient's anterior wound on the shin just below the knee that I did have to use silver nitrate on everything else was achieved with pressure and postdebridement all wounds appear to be ZAIRE, DEMBINSKI ([id number]) [id number] Page 10 of 20 doing better. Electronic Signature(s) Signed: 01/27/2023 1:26:31 PM By: [name] PA-C Entered By: [name] on 01/27/2023 13:26:31 -------------------------------------------------------------------------------- Physician Orders Details Patient Name: Date of Service: Chad [name], Chad [name] 01/27/2023 12:15 PM Medical Record Number: [id number] Patient Account Number: [id number] Date of Birth/Sex: Treating RN: 09-Feb-1961 (62 y.o. 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 Nurse Visit as needed Bathing/ Shower/ Hygiene May shower with wound dressing protected with water repellent cover or cast protector. No tub bath. Anesthetic (Use 'Patient Medications' Section for Anesthetic Order  Entry) Lidocaine applied to wound bed Edema Control - Lymphedema / Segmental Compressive Device / Other Elevate legs to the level of the heart and pump ankles as often as possible Elevate leg(s) parallel to the floor when sitting. Other: - PATIENT MUST SLEEP IN A BED. Legs are swelling due to no elevation of legs. Off-Loading Heel suspension boot - when in bed Medications-Please add to medication list. Other: - apply AandD ointment to leg, do not apply to wound Wound Treatment Wound #12 - Ankle Wound Laterality: Left, Medial, Distal Cleanser: Soap and Water 3 x Per Week/30 Days Discharge Instructions: Gently cleanse wound with antibacterial soap, rinse and pat dry prior to dressing wounds Cleanser: Wound Cleanser 3 x Per Week/30 Days Discharge Instructions: Wash your hands with soap and water. Remove old dressing, discard into plastic bag and place into trash. Cleanse the wound with Wound Cleanser prior to applying a clean dressing using gauze sponges, not tissues or cotton balls. Do not scrub or use excessive force. Pat dry using gauze sponges, not tissue or cotton balls. Prim Dressing: Silvercel Small 2x2 (in/in) 3 x Per Week/30 Days ary Discharge Instructions: Apply Silvercel Small 2x2 (in/in) as instructed Secondary Dressing: Zetuvit Plus 4x8 (in/in) 3 x Per Week/30 Days LAZAROS, PROVENCAL ([id number]) [id number] Page 11 of 20 Compression Wrap: UNNA BOOT - Unna-Chad [name] Zinc and Calamine Boot, 4x10 (in/yd) 3 x Per Week/30 Days Discharge Instructions: Unna Paste, Kerlix and Coban from base of toes to three finger widths below bend in knee. Wound #15 - Foot Wound Laterality: Dorsal, Left Cleanser: Soap and Water 3 x Per Week/30 Days Discharge Instructions: Gently cleanse wound with antibacterial soap, rinse and pat dry prior to dressing wounds Cleanser: Wound Cleanser 3 x Per Week/30 Days  Discharge Instructions: Wash your hands with soap and water. Remove old  dressing, discard into plastic bag and place into trash. Cleanse the wound with Wound Cleanser prior to applying a clean dressing using gauze sponges, not tissues or cotton balls. Do not scrub or use excessive force. Pat dry using gauze sponges, not tissue or cotton balls. Prim Dressing: Silvercel Small 2x2 (in/in) 3 x Per Week/30 Days ary Discharge Instructions: Apply Silvercel Small 2x2 (in/in) as instructed Secondary Dressing: Zetuvit Plus 4x8 (in/in) 3 x Per Week/30 Days Compression Wrap: UNNA BOOT - Unna-Chad [name] Zinc and Calamine Boot, 4x10 (in/yd) 3 x Per Week/30 Days Discharge Instructions: Unna Paste, Kerlix and Coban from base of toes to three finger widths below bend in knee. Wound #16 - Calcaneus Wound Laterality: Right Cleanser: Soap and Water 3 x Per Week/30 Days Discharge Instructions: Gently cleanse wound with antibacterial soap, rinse and pat dry prior to dressing wounds Cleanser: Wound Cleanser 3 x Per Week/30 Days Discharge Instructions: Wash your hands with soap and water. Remove old dressing, discard into plastic bag and place into trash. Cleanse the wound with Wound Cleanser prior to applying a clean dressing using gauze sponges, not tissues or cotton balls. Do not scrub or use excessive force. Pat dry using gauze sponges, not tissue or cotton balls. Prim Dressing: Silvercel Small 2x2 (in/in) 3 x Per Week/30 Days ary Discharge Instructions: Apply Silvercel Small 2x2 (in/in) as instructed Secondary Dressing: Zetuvit Plus 4x8 (in/in) 3 x Per Week/30 Days Compression Wrap: UNNA BOOT - Unna-Chad [name] Zinc and Calamine Boot, 4x10 (in/yd) 3 x Per Week/30 Days Discharge Instructions: Unna Paste, Kerlix and Coban from base of toes to three finger widths below bend in knee. Wound #17 - Lower Leg Wound Laterality: Left, Anterior Cleanser: Soap and Water 3 x Per Week/30 Days Discharge Instructions: Gently cleanse wound with antibacterial soap, rinse and pat dry prior to dressing wounds Cleanser:  Wound Cleanser 3 x Per Week/30 Days Discharge Instructions: Wash your hands with soap and water. Remove old dressing, discard into plastic bag and place into trash. Cleanse the wound with Wound Cleanser prior to applying a clean dressing using gauze sponges, not tissues or cotton balls. Do not scrub or use excessive force. Pat dry using gauze sponges, not tissue or cotton balls. Prim Dressing: Silvercel Small 2x2 (in/in) 3 x Per Week/30 Days ary Discharge Instructions: Apply Silvercel Small 2x2 (in/in) as instructed Secondary Dressing: Zetuvit Plus 4x8 (in/in) 3 x Per Week/30 Days Compression Wrap: UNNA BOOT - Unna-Chad [name] Zinc and Calamine Boot, 4x10 (in/yd) 3 x Per Week/30 Days Discharge Instructions: Unna Paste, Kerlix and Coban from base of toes to three finger widths below bend in knee. Wound #18 - T Second oe Wound Laterality: Right, Medial Cleanser: Soap and Water Every Other Day/30 Days Discharge Instructions: Gently cleanse wound with antibacterial soap, rinse and pat dry prior to dressing wounds Cleanser: Wound Cleanser Every Other Day/30 Days Discharge Instructions: Wash your hands with soap and water. Remove old dressing, discard into plastic bag and place into trash. Cleanse the wound with Wound Cleanser prior to applying a clean dressing using gauze sponges, not tissues or cotton balls. Do not scrub or use excessive force. Pat dry using gauze sponges, not tissue or cotton balls. Prim Dressing: Silvercel Small 2x2 (in/in) Every Other Day/30 Days ary Discharge Instructions: Apply Silvercel Small 2x2 (in/in) as instructed Secondary Dressing: Coverlet Latex-Free Fabric Adhesive Dressings (Generic) Every Other Day/30 Days Discharge Instructions: Knuckle Wound #19 - Lower Leg Wound Laterality: Left,  Posterior Cleanser: Soap and Water 3 x Per Week/30 Days Discharge Instructions: Gently cleanse wound with antibacterial soap, rinse and pat dry prior to dressing wounds Cleanser: Wound  Cleanser 3 x Per Week/30 Days Discharge Instructions: Wash your hands with soap and water. Remove old dressing, discard into plastic bag and place into trash. Cleanse the Chad [name], Chad [name] ([id number]) [id number] Page 12 of 20 wound with Wound Cleanser prior to applying a clean dressing using gauze sponges, not tissues or cotton balls. Do not scrub or use excessive force. Pat dry using gauze sponges, not tissue or cotton balls. Prim Dressing: Silvercel Small 2x2 (in/in) 3 x Per Week/30 Days ary Discharge Instructions: Apply Silvercel Small 2x2 (in/in) as instructed Secondary Dressing: Zetuvit Plus 4x8 (in/in) 3 x Per Week/30 Days Compression Wrap: UNNA BOOT - Unna-Chad [name] Zinc and Calamine Boot, 4x10 (in/yd) 3 x Per Week/30 Days Discharge Instructions: Unna Paste, Kerlix and Coban from base of toes to three finger widths below bend in knee. Electronic Signature(s) Signed: 01/27/2023 5:13:07 PM By: [name] PA-C Signed: 02/02/2023 5:27:27 PM By: Midge Aver MSN RN CNS WTA Entered By: Midge Aver on 01/27/2023 16:40:32 -------------------------------------------------------------------------------- Problem List Details Patient Name: Date of Service: CAPONE, ZAWISTOWSKI 01/27/2023 12:15 PM Medical Record Number: [id number] Patient Account Number: [id number] Date of Birth/Sex: Treating RN: 1961-02-14 (62 y.o. 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- 12/06/2021 No Yes dominant side 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, stage 3 10/07/2022 No Yes Inactive Problems Resolved Problems Electronic Signature(s) Signed: 01/27/2023 12:36:42 PM By: Chad [name], Chad [name] [phone] By: [name] PA-C [id number] Page 13 of 20 Signed: 01/27/2023 12:36:42 Entered By: [name] on 01/27/2023 12:36:42 -------------------------------------------------------------------------------- Progress Note Details Patient Name: Date of Service: Chad [name], Chad [name] 01/27/2023 12:15 PM Medical Record Number: [id number] Patient Account Number: [id number] Date of Birth/Sex: Treating RN: [birth year] (61 y.o. Chad [name] Primary Care Provider: [name] Other Clinician: Referring Provider: Treating Provider/Extender: [name] Weeks in Treatment: 36 Subjective Chief Complaint Information obtained from Patient Left ankle ulcer, Left dorsal foot wound, right heel History of Present Illness (HPI) 10/08/18 on evaluation today patient actually presents to our office for initial evaluation concerning wounds that he has of the bilateral lower extremities. 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 He also has bilateral venous stasis. He apparently has been homeless currently following discharge from the hospital apparently he has been placed at [facility] which is is a skilled nursing facility locally. Nonetheless fortunately he does not show any signs of infection at this time which is good news. In fact several of the wound actually appears to be showing some signs of improvement already in my pinion. There are a couple areas in the left leg in particular there likely gonna require some  sharp debridement to help clear away some necrotic tissue and help with more sufficient healing. No  fevers, chills, nausea, or vomiting noted at this time. 10/15/18 on evaluation today patient actually appears to be doing very well in regard to his bilateral lower extremities. He's been tolerating the dressing changes without complication. Fortunately there does not appear to be any evidence of active infection at this time which is great news. Overall I'm actually very pleased with how this has progressed in just one visits time. Readmission: 08/14/2020 upon evaluation today patient presents for re-evaluation here in our clinic. He is having issues with his left ankle region as well as his right toe and his right heel. He tells me that the toe and heel actually began as a area that was itching that he was scratching and then subsequently opened up into wounds. These may have been abscess areas I presume based on what I am seeing currently. With regard to his left ankle region he tells me this was a similar type occurrence although he does have venous stasis this very well may be more of a venous leg ulcer more than anything. Nonetheless I do believe that the patient would benefit from appropriate and aggressive wound care to try to help get things under better control here. He does have history of a stroke on the left side affecting him to some degree there that he is able to stand although he does have some residual weakness. Otherwise again the patient does have chronic venous insufficiency as previously noted. His arterial studies most recently obtained showed that he had an ABI on the right of 1.16 with a TBI of 0.52 and on the left and ABI of 1.14 with a TBI of 0.81. That was obtained on 06/19/2020. 08/28/2020 upon evaluation today patient appears to be doing decently well in regard to his wounds in general. He has been tolerating the dressing changes without complication. Fortunately there does not  appear to be any signs of active infection which is great news. With that being said I think the [facility] is doing a good job I would recommend that we likely continue with that currently. 09/11/2020 upon evaluation today patient's wounds did not appear to be doing too poorly but again he is not really showing signs of significant improvement with regard to any of the wounds on the right. None of them have Hydrofera Blue on them I am not exactly sure why this is not being followed as the facility did not contact us to let us know of any issues with obtaining dressings or otherwise. With that being said he is supposed to be using Hydrofera Blue on both of the wounds on the right foot as well as the ankle wound on the left side. 09/18/2020 upon evaluation today patient appears to be doing poorly with regard to his wounds. Again right now the left ankle in particular showed signs of extreme maceration. Apparently he was told by someone with staff at [facility] they could not get the [facility] With that being said this is something that is never been relayed to [place] one way or another. Also the patient subsequently has not supposed to have a border gauze dressing on. He should have an ABD pad and roll gauze to secure as this drains much too much just to have a border gauze dressing to cover. Nonetheless the fact that they are not using the appropriate dressing is directly causing deterioration of the left ankle wound it is significantly worse today compared to what it was previous. I did attempt to call [facility] while the patient was here I called three times and got no one to even pick up the phone. After this I had my for an office coordinator call and she was able to finally get through and leave a message with the D ON as of dictation of this note which is roughly about an hour and a half later I still have not been able to speak with anyone at the facility. 09/25/2020 upon  evaluation today patient actually showing signs of good improvement which is excellent news. He has been tolerating the dressing changes without complication. Fortunately there is no signs of active infection which is great news. No fevers, chills, nausea, vomiting, or diarrhea. I do feel like the facility has been doing a much better job at taking care of him as far as the dressings are concerned. However the director of nursing never did call me back. 10/09/2020 upon evaluation today patient appears to be doing well with regard to his wound. The toe ulcer did require some debridement but the other 2 areas actually appear to be doing quite well. 10/19/2020 upon evaluation today patient actually appears to be doing very well in regard to his wounds. In fact the heel does appear to be completely healed. The toe is doing better in the medial ankle on the left is also doing better. Overall I think he is headed in the right direction. 10/26/2020 upon evaluation today patient appears to be doing well with regard to his wound. He is showing signs of improvement which is great news and overall I am very pleased with where things stand today. No fevers, chills, nausea, vomiting, or diarrhea. TWYMAN, PRUSAK ([id number]) [id number] Page 14 of 20 11/02/2020 upon evaluation today patient appears to be doing well with regard to his wounds. He has been tolerating the dressing changes without complication overall I am extremely pleased with where things stand today. He in regard to the toe is almost completely healed and the medial ankle on the left is doing much better. 11/09/2020 upon evaluation today patient appears to be doing a little poorly in regard to his left medial ankle ulcer. Fortunately there does not appear to be any signs of systemic infection but unfortunately locally he does appear to be infected in fact he has blue-green drainage consistent with Pseudomonas. 11/16/2020 upon  evaluation today patient appears to be doing well with regard to his wound. It actually appears to be doing better. I did place him on gentamicin cream since the Cipro was actually resistant even though he was positive for Pseudomonas on culture. Overall I think that he does seem to be doing better though I am unsure whether or not they have actually been putting the cream on. The patient is not sure that we did talk to the nurse directly and she was going to initiate that treatment. Fortunately there does not appear to be any signs of active infection at this time. No fevers, chills, nausea, vomiting, or diarrhea. 4/28; the area on the right second toe is close to healed. Left medial ankle required debridement 12/07/2020 upon evaluation today patient appears to be doing well with regard to his wounds. In fact the right second toe appears to be completely healed which is great news. Fortunately there does not appear to be any signs of active infection at this time which is also great news. I think we can probably discontinue the gentamicin on top of everything else. 12/14/2020 upon evaluation today patient appears to be doing  well with regard to his wound. He is making good progress and overall very pleased with where things stand today. There is no signs of active infection at this time which is great news. 12/28/2020 upon evaluation today patient appears to be doing well with regard to his wounds. He has been tolerating the dressing changes without complication. Fortunately there is no signs of active infection at this time. No fevers, chills, nausea, vomiting, or diarrhea. 12/28/2020 upon evaluation today patient's wound bed actually showed signs of excellent improvement. He has great epithelization and granulation I do not see any signs of infection overall I am extremely pleased with where things stand at this point. No fevers, chills, nausea, vomiting, or diarrhea. 01/11/2021 upon evaluation today  patient appears to be doing well with regard to his wound on his leg. He has been tolerating the dressing changes without complication. Fortunately there does not appear to be any signs of active infection which is great news. No fevers, chills, nausea, vomiting, or diarrhea. 01/25/2021 upon evaluation today patient appears to be doing well with regard to his wound. He has been tolerating the dressing changes without complication. Fortunately the collagen seems to be doing a great job which is excellent news. No fevers, chills, nausea, vomiting, or diarrhea. 02/08/2021 upon evaluation today patient's wound is actually looking a little bit worse especially in the periwound compared to previous. Fortunately there does not appear to be any signs of infection which is great news with that being said he does have some irritation around the periphery of the wound which has me more concerned. He actually had a dressing on that had not been changed in 3 days. He also is supposed to have daily dressing changes. With regard to the dressing applied he had a silver alginate dressing and silver collagen is what is recommended and ordered. He also had no Desitin around the edges of the wound in the periwound region although that is on the order inspect to be done as well. In general I was very concerned I did contact [facility] actually spoke with Verlon Au who is the scheduling individual and subsequently she stated that she would pass the information to the D ON apparently the D ON was not available to talk to me when I call today. 02/18/2021 upon evaluation today patient's wound is actually showing signs of improvement. Fortunately there does not appear to be any evidence of infection which is great news overall I am extremely pleased with where things stand today. No fevers, chills, nausea, vomiting, or diarrhea. 8/3; patient presents for 1 week follow-up. He has no issues or complaints today. He denies signs  of infection. 03/11/2021 upon evaluation today patient appears to be doing well with regard to his wound. He does have a little bit of slough noted on the surface of the wound but fortunately there does not appear to be any signs of active infection at this time. No fevers, chills, nausea, vomiting, or diarrhea. 03/18/2021 upon evaluation today patient appears to be doing well with regard to his wound. He has been tolerating the dressing changes without complication. There was a little irritation more proximal to where the wound was that was not noted last week but nonetheless this is very superficial just seems to be more irritation we just need to make sure to put a good amount of the zinc over the area in my opinion. Otherwise he does not seem to be doing significantly worse at all which is great news.  03/25/2021 upon evaluation today patient appears to be doing well with regard to his wound. He is going require some sharp debridement today to clear with some of the necrotic debris. I did perform this today without complication postdebridement wound bed appears to be doing much better this is great news. 04/08/2021 upon evaluation today patient appears to be doing decently well in regard to his wound although the overall measurement is not significantly smaller compared to previous. It is gone down a little bit but still the facility continues to not really put the appropriate dressings in place in fact he was supposed to have collagen we think he probably had more of an allergy to At this point. Fortunately there does not appear to be any signs of active infection systemically though locally I do not see anything on initial visualization either as far as erythema or warmth. 04/15/2021 upon evaluation today patient appears to be doing well with regard to his wound. He is actually showing signs of improvement. I did place him on antibiotics last week, Cipro. He has been taking that 2 times a day and  seems to be tolerating it very well. I do not see any evidence of worsening and in fact the overall appearance of the wound is smaller today which is also great news. 9/26; left medial ankle chronic venous insufficiency wound is improved. Using Hydrofera Blue 10/10; left medial ankle chronic venous insufficiency. Wound has not changed much in appearance completely nonviable surface. Apparently there have been problems getting the right product on the wound at the facility although he came in with [facility] on today 05/14/2021 upon evaluation today patient appears to be doing well with regard to his wound. I think he is making progress here which is good news. Fortunately there does not appear to be any signs of active infection at this time. No fevers, chills, nausea, vomiting, or diarrhea. 05/20/2021 upon evaluation today patient appears to be doing well with regard to his wound. He is showing signs of good improvement which is great news. There does not appear to be any evidence of active infection which is also excellent news. No fevers, chills, nausea, vomiting, or diarrhea. 05/28/2021 upon evaluation today patient appears to be doing quite well. There does not appear to be any signs of active infection at this time which is great news. Overall I am extremely pleased with where things stand today. I think he is headed in the right direction. 06/11/2021 upon evaluation today patient appears to be doing well with regard to his left ankle ulcer and poorly in regard to the toe ulcer on the second toe right foot. This appears to show signs of joint exposure. Apparently this has been present for 1 to 2 months although he kept forgetting to tell me about it. That is unfortunate as right now it definitely appears to be doing significantly worse than what I would like to see. There does not appear to be any signs of active infection systemically though locally I am concerned about the possibility of  infection the toe is quite red. Again no one from the facility ever contacted [place] to advise that this was going on in the interim either. 06/17/2021 upon evaluation today patient presents for follow-up I did review his x-ray which showed a navicular bone fracture I am unsure of the chronicity of this. Subsequently he also had osteomyelitis of the toe which was what I was more concerned about this did not show up on x-ray  but did show up on the pathology scrapings. This was listed as acute osteomyelitis. Nonetheless at this point I think that the antibiotic treatment is the best regimen to go with currently. The patient is in agreement with that plan. Nonetheless he has initially 30 days of doxycycline off likely extend that towards the end of the treatment cycle that will be around the middle of December for an additional 2 weeks. That all depends on how well he continues to heal. Nonetheless based on what I am seeing in the foot I did want a proceed with an MRI as well which I think will be helpful to identify if there is anything else that needs to be addressed from the [facility] ([id number]) [id number] Page 15 of 20 standpoint of infection. 06/24/2021 upon evaluation today patient appears to be doing pretty well in regards to his wounds. I think both are actually showing signs of improvement which is good I did review his MRI today which did show signs of osteomyelitis of the middle and proximal phalanx on his right foot of the affected toe. With that being said this is actually showing signs of significant improvement today already with the antibiotic therapy I think the redness is also improved. Overall I think that we just need to give this some time with appropriate wound care we will see how things go potentially hyperbarics could be considered. 07/02/2021 upon inspection today patient actually appears to be doing well in regard to his left ankle which is  getting very close to complete resolution of pleased in that regard. Unfortunately he is continuing to have issues with his second toe right foot and this seems to still be very painful for him. Recommend he try something different from the standpoint of antibiotics. 07/15/2021 upon evaluation today patient appears to be doing actually pretty well in regard to his foot. This is actually showing signs of significant improvement which is great news. Overall I feel like the patient is improving both in regard to the second toe as well as the ankle on the left. With that being said the biggest issue that I do see currently is that he is needing to have a refill of the doxycycline that we previously treated him with. He also did see podiatry they are not going to recommend any amputation at this point since he seems to be doing quite well. For that reason we just need to keep things under control from an infection standpoint. 08/01/2021 upon evaluation today patient appears to be doing well with regard to his wound. He has been tolerating the dressing changes without complication. Fortunately there does not appear to be any evidence of active infection locally nor systemically at this point. In fact I think everything is doing excellent in fact his second toe on the right foot is almost healed and the ankle on the left ankle region is actually very close to being healed as well. 08/08/2021 upon evaluation today patient appears to be doing well with regard to his wound. He has been tolerating the dressing changes without complication. Fortunately I do not see any signs of active infection at this time. Readmission: 12-06-2021 upon evaluation today patient presents for reevaluation here in the clinic he does tell me that he was being seen in facility at [facility] by a provider that was coming in. He is not sure who this was. He tells me however that the wound seems to have gotten worse even compared to  where it was  when we last saw him at this point. With that being said I do believe that he is likely going need ongoing wound care here in the clinic and I do believe that we need to be the ones to frontline this since his wound does seem to be getting worse not better at this point. He voiced understanding. He is also in agreement with this plan and feels more comfortable coming here she tells me. Patient's medical history really has not changed since his prior admission he was only gone since January. 12-27-2021 upon evaluation today patient appears to be doing well with regard to his wound they did run out of the [facility] so they did not put anything on just an ABD pad with gentamicin. Still we are seeing some signs of good improvement here with some new epithelization which is great news. 01-10-2022 upon evaluation today patient appears to be doing well with regard to his wounds and he is going require some sharp debridement but overall seems to be making good progress. Fortunately I do not see any evidence of active infection locally or systemically at this time which is great news. 01-24-2022 upon evaluation today patient appears to be doing well with regard to his wound. The facility actually came and dropped him off early and he had another appointment at the hospital and then they just brought him over here and this was still hours before his appointment this afternoon. For that reason we did do our best to work him in this morning and fortunately had some space to make this happen. With that being said patient's wound does seem to be making progress here and I am very pleased in that regard I do not see any signs of active infection locally or systemically at this time. 02-07-2022 patient appears to be doing well currently in regard to his wounds. In fact one of them the more proximal is healed the distal is still open but seems to be doing excellent. Fortunately I do not see any evidence of  active infection locally or systemically at this time which is great news. No fevers, chills, nausea, vomiting, or diarrhea. 7/27; left medial ankle venous. Improving per our intake nurse. We are using Hydrofera Blue under Tubigrip compression. They are changing that at his facility 03-06-2022 upon evaluation today patient appears to be doing well with regard to the his wound he is can require some sharp debridement but seems to be making excellent progress. Fortunately I do not see any evidence of active infection locally or systemically which is great news. 03-27-2022 upon evaluation today patient's wound is actually showing signs of excellent improvement. Fortunately I see no signs of active infection locally or systemically at this time which is great news. No fevers, chills, nausea, vomiting, or diarrhea. 04-21-2022 upon evaluation today patient appears to be doing excellent in regard to his wound in fact this is very close to resolution based on what I am seeing. I do not see any evidence of active infection locally or systemically at this time which is great news and overall I am extremely pleased with where we are today. 05-05-2022 upon evaluation today patient's wound actually appears to potentially be completely healed. Fortunately I do not see any evidence of active infection at this time which is great news and overall I am very pleased I think this needs a little time to toughen up but other than that I really do believe were doing quite well. He is very pleased  to hear this its been a long time coming. 05-19-2022 upon evaluation today patient appears to be doing well currently in regard to his wound. He in fact we were hoping will be completely healed and closed but it still has a very tiny area right in the center which is still continuing to drain. Fortunately I do not see any signs of infection locally or systemically which is great news. 11/6; this wound is close to completely" he comes  in this week with some erythema at 1 edge of this which looks like something was rubbing on the area. Other than that no evidence of infection 06-16-2022 upon evaluation today patient appears to be doing well currently in regard to his ankle ulcer. This is very close to being healed but still has a small area in the central portion which is not. Fortunately there does not appear to be any signs of infection locally or systemically which is great news. No fevers, chills, nausea, vomiting, or diarrhea. 06-30-2022 upon evaluation patient's wound pretty much appears to be almost completely closed. In fact it may even be closed but there are still a lot of skin irritation around the edges of the wound and I just do not feel great about releasing them yet I would like to continue to monitor this just a little bit longer before getting to that point. He is not opposed to this and in fact is happy to continue to come in if need be in order to make sure that things are moving in the right direction he definitely does not want to backtrack. 07-14-2022 upon evaluation today patient appears to be doing well currently in regard to his wound. Has been tolerating the dressing changes without complication. Fortunately I see no evidence of active infection locally nor systemically which is great news. No fevers, chills, nausea, vomiting, or diarrhea. 08-11-2022 upon evaluation today patient appears to be doing a little worse than last time I saw him although it has been a month. Unfortunately he was not able to be seen due to the fact that he was quarantined in the facility with COVID. Subsequently he tells me that he mention to the facility staff several times about taking it off over the past several weeks but they continue to tell him that someone have to get in touch with [place] here although nobody ever did until Friday when the nurse manager called to have that documented in the communication notes. With that being  said unfortunately this means that he had a wrap on that he was supposed to have on for only 1 week for ended up being on four 1 month essentially. I am glad that there is nothing worse than what we see currently to be perfectly honest. 08-19-2022 upon evaluation today patient appears to be doing well currently in regard to his wounds. He has been tolerating the dressing changes without TEION, CURETON ([id number]) [id number] Page 16 of 20 complication. This is actually smaller today which is great news after last week's reopening. Fortunately I do not see any evidence of infection locally nor systemically at this point. 08-26-2022 upon evaluation today patient appears to be doing well currently in regard to his wound. He has been tolerating the dressing changes without complication. Fortunately there does not appear to be any signs of active infection locally nor systemically which is great news. No fevers, chills, nausea, vomiting, or diarrhea. 09-02-2022 upon evaluation today patient's wound actually showing signs of excellent improvement this is measuring smaller  and looking much better. I am very pleased with where we stand I do believe that we are headed in the right direction. 09-09-2022 upon evaluation today patient appears to be doing decently well in regard to his leg in general although there was some swelling this is the 1 thing that I am not too happy with based on what I see currently. Fortunately there does not appear to be any signs of infection locally nor systemically at this point. 09-16-2022 upon evaluation today patient appears to be doing well currently in regard to his wound. He is actually showing signs of improvement he wore the Tubigrip this week and it significantly better compared to last week's evaluation. Fortunately I do not see any evidence of active infection locally or systemically which is great news. Overall I think that were headed in the  right direction which is great news. In fact overall I think this is the best that he has looked in some time. He does not like the Tubigrip but actually did extremely well with this in my opinion. I think he needs to continue to utilize this. 09-23-2022 upon evaluation today patient appears to be doing well currently in regard to his wound. He has been tolerating the dressing changes without complication. Fortunately there does not appear to be any signs of active infection locally nor systemically which is great news and in general I do feel like that we are headed in the right direction. He has been having trouble with the Tubigrip however we have gotten the facility to order compression socks he has not gotten them as of yet. 3/4; patient is about the same with a wound on the left posterior leg. This is a venous wound. He needs more compression on this leg but he took off the previous wraps we are using. I am not certain whether he is using juxta lite stockings at the nursing home. 3/12; patient presents for follow-up. He unfortunately has 2 new wounds 1 to the dorsal left side and the other to the right heel. He is not sure how these happened. The right heel appears to be caused by pressure. He has been using Hydrofera Blue to the original wound on the left leg. 10-14-2022 upon evaluation today patient appears to be doing well currently in regard to his original wound but unfortunately he has several new wounds which I was completely unaware of before walking into the room to see him today. Unfortunately he seems to not be doing nearly as well as what I saw when I last had an appointment with him 3 weeks ago. Fortunately I do not see any signs of systemic infection though unfortunately locally he has definite infection of the left lower extremity. He is currently on doxycycline I am going to likely have to extend that today. 10-21-2022 upon evaluation today patient appears to be doing well currently  in regard to his wounds. He seems to be tolerating the dressing changes without complication. Fortunately there does not appear to be any signs of active infection locally or systemically which is great news and in general I definitely feel like we are on the right track here. 10-30-2022 upon evaluation today patient still continues to not be doing too well at all with regard to his wounds. Unfortunately he is still having signs of infection as well which also has been concerned. Fortunately I do not see any signs of active infection locally nor systemically at this time. No fevers, chills, nausea, vomiting, or  diarrhea. 11-06-2022 upon evaluation today patient appears to be doing okay currently in regard to his wounds. All things considered with his blood flow not being nearly as good as what should I think that he is making pretty good progress here. Fortunately I do not see any signs of infection locally nor systemically which is great news. No fevers, chills, nausea, vomiting, or diarrhea. 11-13-2022 upon evaluation today patient appears to be doing poorly currently in regard to his wounds. Things seem to be getting a little bit worse not better. 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 Subsequently he is also going to need to have vascular intervention but we are unsure exactly when this is going on the undertaking. I would try to actually send a message to Dr. Hazle Quant and the vascular team just to see what exactly the plan is going forward. The patient is in agreement with that plan I just think we need to have a plan especially in light of the fact the left leg seems to be getting a little bit worse at this point. 11-25-2022 upon evaluation today patient is currently now discharged from the hospital following his colostomy procedure. That did very well and he seems to be doing well. Fortunately there  does not appear to be any signs of active infection locally or systemically which is great news. No fevers, chills, nausea, vomiting, or diarrhea. 12-04-2022 upon evaluation today patient appears to be doing actually decently well in regard to his wounds and actually very pleased with where we stand and I think that he is making some pretty good progress here. Fortunately I do not see any signs of active infection locally nor systemically which is great news. No fevers, chills, nausea, vomiting, or diarrhea. 12-12-2022 upon evaluation today patient appears to be doing well currently in regard to his wound. He has been tolerating the dressing changes without complication. The wounds all seem to be making some progress to a degree. He has appointment with vascular on the 23rd of this month. 5/24; the patient was scheduled for a left lower extremity angiogram yesterday but apparently they were overbooked and they wanted to admit him to hospital last night so they could do this early this morning but he refused I see that Dr. Wyn Quaker has rescheduled him for June 6. He is using silver alginate to wounds on his bilateral lower extremities. 01-02-2023 upon evaluation today patient appears to be doing poorly in regard to his wounds still. Subsequently he did have his angiogram but apparently there really was not much that was found that could be revascularized. The patient tells me that Dr. Wyn Quaker was going to contact me here in the office I have not heard from him as of yet and I may see about giving him a call just to follow-up or possibly send a message through epic which could be an option as well. 01-09-2023 upon evaluation today patient appears to be doing poorly in regard to his legs he is extremely swollen. He has been sleeping in his chair he tells me for the past week. His bed is broken and they have ordered a new one which is supposedly supposed to be there today. With that being said he the way he needs to be  not sleeping in his chair that is his wheelchair every day and through the night. This is causing his legs to be  much more swollen and it seen and how badly wounds are doing at this point. This has made things significantly worse. Fortunately I do not see any signs of infection right now unfortunately I do believe that the patient has a tremendous amount of drainage currently. 01-19-2023 upon evaluation today patient appears to be doing poorly in regard to his wounds of his legs his swelling is better but still not as good as what we would like to see. Fortunately I do not see any evidence of active infection locally nor systemically which is great news and in general I do believe that we are moving in the right direction here. Nonetheless it would be better if she was in the bed sleeping or not send up in his wheelchair although he seems to still be set up in the wheelchair at this time. He tells me that he is just not as comfortable in the bed as the wheelchair but unfortunately this is causing damage to his legs which I discussed with him today as well. Nonetheless I am not sure that he is going to be amendable to doing anything different. 01-27-2023 upon evaluation today patient appears to be doing well currently. Fortunately I do not see any evidence of active infection locally nor systemically which is great news I think he is actually doing the best option and while in fact I would try to see if we can do some debridement today to get things moving along. GUSTAVE, MCFAUL ([id number]) [id number] Page 17 of 20 Objective Constitutional Well-nourished and well-hydrated in no acute distress. Vitals Time Taken: 12:24 PM, Height: 69 in, Weight: 150 lbs, BMI: 22.1, Temperature: 98.1 F, Pulse: 92 bpm, Respiratory Rate: 16 breaths/min, Blood Pressure: 135/83 mmHg. Respiratory normal breathing without difficulty. Psychiatric this patient is able to make decisions and  demonstrates good insight into disease process. Alert and Oriented x 3. pleasant and cooperative. General Notes: Upon inspection bed actually showed signs of good granulation and epithelization. He is actually showing some signs of improvement here which is good news and I think that along with Unna boot wraps that he is doing well with the dressing changes and I think that his wound is significantly improved at all locations compared to last week. I am going to try to do some debridement I did perform debridement today and he had some bleeding in the left patient's anterior wound on the shin just below the knee that I did have to use silver nitrate on everything else was achieved with pressure and postdebridement all wounds appear to be doing better. Integumentary (Hair, Skin) Wound #12 status is Open. Original cause of wound was Gradually Appeared. The date acquired was: 07/12/2019. The wound has been in treatment 59 weeks. The wound is located on the Left,Distal,Medial Ankle. The wound measures 2.3cm length x 0.7cm width x 0.2cm depth; 1.264cm^2 area and 0.253cm^3 volume. There is a medium amount of serosanguineous drainage noted. Wound #15 status is Open. Original cause of wound was Gradually Appeared. The date acquired was: 10/01/2022. The wound has been in treatment 16 weeks. The wound is located on the Left,Dorsal Foot. The wound measures 4.4cm length x 1cm width x 0.1cm depth; 3.456cm^2 area and 0.346cm^3 volume. There is a medium amount of serosanguineous drainage noted. There is no granulation within the wound bed. There is a large (67-100%) amount of necrotic tissue within the wound bed including Adherent Slough. Wound #16 status is Open. Original cause of wound was Gradually Appeared. The date acquired was:  09/26/2022. The wound has been in treatment 16 weeks. The wound is located on the Right Calcaneus. The wound measures 2cm length x 1.2cm width x 0.2cm depth; 1.885cm^2 area and 0.377cm^3  volume. There is a medium amount of serosanguineous drainage noted. There is small (1-33%) pink granulation within the wound bed. There is a large (67-100%) amount of necrotic tissue within the wound bed including Adherent Slough. Wound #17 status is Open. Original cause of wound was Pressure Injury. The date acquired was: 09/30/2022. The wound has been in treatment 15 weeks. The wound is located on the Left,Anterior Lower Leg. The wound measures 3.5cm length x 2.5cm width x 0.2cm depth; 6.872cm^2 area and 1.374cm^3 volume. There is a medium amount of serosanguineous drainage noted. There is no granulation within the wound bed. There is a large (67-100%) amount of necrotic tissue within the wound bed including Adherent Slough. Wound #18 status is Open. Original cause of wound was Pressure Injury. The date acquired was: 09/26/2022. The wound has been in treatment 15 weeks. The wound is located on the Right,Medial T Second. The wound measures 1cm length x 0.5cm width x 0.1cm depth; 0.393cm^2 area and 0.039cm^3 volume. oe There is a medium amount of serosanguineous drainage noted. There is no granulation within the wound bed. There is a large (67-100%) amount of necrotic tissue within the wound bed including Adherent Slough. Wound #19 status is Open. Original cause of wound was Gradually Appeared. The date acquired was: 11/10/2022. The wound has been in treatment 10 weeks. The wound is located on the Left,Posterior Lower Leg. The wound measures 3cm length x 1.5cm width x 0.1cm depth; 3.534cm^2 area and 0.353cm^3 volume. There is a medium amount of serous drainage noted. There is no granulation within the wound bed. There is a large (67-100%) amount of necrotic tissue within the wound bed including Adherent Slough. 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 There was a Excisional Skin/Subcutaneous Tissue Debridement with a total area of 1.26 sq cm performed by [name], PA-C. With the following instrument(s): Curette to remove Viable and Non-Viable tissue/material. Material removed includes Subcutaneous Tissue and Slough and after achieving pain control using Lidocaine 4% T opical Solution. No specimens were taken. A time out was conducted at 13:11, prior to the start of the procedure. A Moderate amount of bleeding was controlled with Pressure. The procedure was tolerated well with a pain level of 0 throughout and a pain level of 0 following the procedure. Post Debridement Measurements: 2.3cm length x 0.7cm width x 0.2cm depth; 0.253cm^3 volume. Character of Wound/Ulcer Post Debridement is stable. Severity of Tissue Post Debridement is: Fat layer exposed. Post procedure Diagnosis Wound #12: Same as Pre-Procedure [name] ([id number]) [id number] Page 18 of 20 Pre-procedure diagnosis of Wound #12 is a Venous Leg Ulcer located on the Left,Distal,Medial Ankle . There was a Radio broadcast assistant Compression Therapy Procedure by Midge Aver, RN. Post procedure Diagnosis Wound #12: Same as Pre-Procedure Wound #16 Pre-procedure diagnosis of Wound #16 is a Pressure Ulcer located on the Right Calcaneus . There was a  Excisional Skin/Subcutaneous Tissue Debridement with a total area of 1.88 sq cm performed by [name], PA-C. With the following instrument(s): Curette to remove Viable and Non-Viable tissue/material. Material removed includes  Subcutaneous Tissue and Slough and after achieving pain control using Lidocaine 4% T opical Solution. No specimens were taken. A time out was conducted at 13:11, prior to the start of the procedure. A Moderate amount of bleeding was controlled with Pressure. The procedure was tolerated well with a pain level of 0 throughout and a pain level of 0 following the procedure. Post Debridement Measurements: 2cm length x 1.2cm width x 0.2cm depth; 0.377cm^3 volume. Post debridement Stage noted as Category/Stage III. Character of Wound/Ulcer Post Debridement is stable. Post procedure Diagnosis Wound #16: Same as Pre-Procedure Pre-procedure diagnosis of Wound #16 is a Pressure Ulcer located on the Right Calcaneus . There was a Radio broadcast assistant Compression Therapy Procedure by Midge Aver, RN. Post procedure Diagnosis Wound #16: Same as Pre-Procedure Wound #17 Pre-procedure diagnosis of Wound #17 is a Pressure Ulcer located on the Left,Anterior Lower Leg . There was a Excisional Skin/Subcutaneous Tissue Debridement with a total area of 6.87 sq cm performed by [name], PA-C. With the following instrument(s): Curette to remove Viable and Non-Viable tissue/material. Material removed includes Subcutaneous Tissue and Slough and after achieving pain control using Lidocaine 4% T opical Solution. No specimens were taken. A time out was conducted at 13:11, prior to the start of the procedure. A Moderate amount of bleeding was controlled with Silver Nitrate. The procedure was tolerated well with a pain level of 0 throughout and a pain level of 0 following the procedure. Post Debridement Measurements: 3.5cm length x 2.5cm width x 0.2cm depth; 1.374cm^3 volume. Post debridement Stage noted as Category/Stage II. Character of Wound/Ulcer Post Debridement is stable. Post procedure Diagnosis Wound #17: Same as Pre-Procedure Pre-procedure diagnosis of Wound #17 is a Pressure Ulcer located on the Left,Anterior Lower Leg . There  was a Radio broadcast assistant Compression Therapy Procedure by Midge Aver, RN. Post procedure Diagnosis Wound #17: Same as Pre-Procedure Wound #18 Pre-procedure diagnosis of Wound #18 is a Pressure Ulcer located on the Right,Medial T Second . There was a Excisional Skin/Subcutaneous Tissue oe Debridement with a total area of 0.39 sq cm performed by [name], PA-C. With the following instrument(s): Curette to remove Viable and Non-Viable tissue/material. Material removed includes Subcutaneous Tissue and Slough and after achieving pain control using Lidocaine 4% T opical Solution. No specimens were taken. A time out was conducted at 13:11, prior to the start of the procedure. A Moderate amount of bleeding was controlled with Pressure. The procedure was tolerated well with a pain level of 0 throughout and a pain level of 0 following the procedure. Post Debridement Measurements: 1cm length x 0.5cm width x 0.1cm depth; 0.039cm^3 volume. Post debridement Stage noted as Category/Stage II. Character of Wound/Ulcer Post Debridement is stable. Post procedure Diagnosis Wound #18: Same as Pre-Procedure Pre-procedure diagnosis of Wound #18 is a Pressure Ulcer located on the Right,Medial T Second . There was a Radio broadcast assistant Compression Therapy Procedure oe by Midge Aver, RN. Post procedure Diagnosis Wound #18: Same as Pre-Procedure Wound #19 Pre-procedure diagnosis of Wound #19 is a Venous Leg Ulcer located on the Left,Posterior Lower Leg .Severity of Tissue Pre Debridement is: Fat layer exposed. There was a Excisional Skin/Subcutaneous Tissue Debridement with a total area of 3.53 sq cm performed by [name], PA-C. With the following instrument(s): Curette to remove Viable and Non-Viable tissue/material. Material  removed includes Subcutaneous Tissue and Slough and after achieving pain control using Lidocaine 4% T opical Solution. No specimens were taken. A time out was conducted at 13:11, prior to the start of the  procedure. A Moderate amount of bleeding was controlled with Pressure. The procedure was tolerated well with a pain level of 0 throughout and a pain level of 0 following the procedure. Post Debridement Measurements: 3cm length x 1.5cm width x 0.1cm depth; 0.353cm^3 volume. Character of Wound/Ulcer Post Debridement is stable. Severity of Tissue Post Debridement is: Fat layer exposed. Post procedure Diagnosis Wound #19: Same as Pre-Procedure Pre-procedure diagnosis of Wound #19 is a Venous Leg Ulcer located on the Left,Posterior Lower Leg . There was a Radio broadcast assistant Compression Therapy Procedure by Midge Aver, RN. Post procedure Diagnosis Wound #19: Same as Pre-Procedure Wound #15 Pre-procedure diagnosis of Wound #15 is a Venous Leg Ulcer located on the Left,Dorsal Foot . There was a Radio broadcast assistant Compression Therapy Procedure by Midge Aver, RN. Post procedure Diagnosis Wound #15: Same as Pre-Procedure Plan Follow-up Appointments: Return Appointment in 1 week. Nurse Visit as needed Bathing/ Shower/ Hygiene: May shower with wound dressing protected with water repellent cover or cast protector. No tub bath. Anesthetic (Use 'Patient Medications' Section for Anesthetic Order Entry): Lidocaine applied to wound bed Chad [name], Chad [name] ([id number]) [id number] Page 19 of 20 Edema Control - Lymphedema / Segmental Compressive Device / Other: Elevate legs to the level of the heart and pump ankles as often as possible Elevate leg(s) parallel to the floor when sitting. Other: - PATIENT MUST SLEEP IN A BED. Legs are swelling due to no elevation of legs. Off-Loading: Heel suspension boot - when in bed Medications-Please add to medication list.: Other: - apply AandD ointment to leg, do not apply to wound WOUND #12: - Ankle Wound Laterality: Left, Medial, Distal Cleanser: Soap and Water 3 x Per Week/30 Days Discharge Instructions: Gently cleanse wound with antibacterial soap, rinse  and pat dry prior to dressing wounds Cleanser: Wound Cleanser 3 x Per Week/30 Days Discharge Instructions: Wash your hands with soap and water. Remove old dressing, discard into plastic bag and place into trash. Cleanse the wound with Wound Cleanser prior to applying a clean dressing using gauze sponges, not tissues or cotton balls. Do not scrub or use excessive force. Pat dry using gauze sponges, not tissue or cotton balls. Prim Dressing: Silvercel Small 2x2 (in/in) 3 x Per Week/30 Days ary Discharge Instructions: Apply Silvercel Small 2x2 (in/in) as instructed Secondary Dressing: Zetuvit Plus 4x8 (in/in) 3 x Per Week/30 Days Com pression Wrap: UNNA BOOT - Unna-Chad [name] Zinc and Calamine Boot, 4x10 (in/yd) 3 x Per Week/30 Days Discharge Instructions: Unna Paste, Kerlix and Coban from base of toes to three finger widths below bend in knee. WOUND #15: - Foot Wound Laterality: Dorsal, Left Cleanser: Soap and Water 3 x Per Week/30 Days Discharge Instructions: Gently cleanse wound with antibacterial soap, rinse and pat dry prior to dressing wounds Cleanser: Wound Cleanser 3 x Per Week/30 Days Discharge Instructions: Wash your hands with soap and water. Remove old dressing, discard into plastic bag and place into trash. Cleanse the wound with Wound Cleanser prior to applying a clean dressing using gauze sponges, not tissues or cotton balls. Do not scrub or use excessive force. Pat dry using gauze sponges, not tissue or cotton balls. Prim Dressing: Silvercel Small 2x2 (in/in) 3 x Per Week/30 Days ary Discharge Instructions: Apply Silvercel Small 2x2 (in/in) as instructed Secondary Dressing: Zetuvit Plus 4x8 (in/in)  3 x Per Week/30 Days Com pression Wrap: UNNA BOOT - Unna-Chad [name] Zinc and Calamine Boot, 4x10 (in/yd) 3 x Per Week/30 Days Discharge Instructions: Unna Paste, Kerlix and Coban from base of toes to three finger widths below bend in knee. WOUND #16: - Calcaneus Wound Laterality: Right Cleanser: Soap  and Water 3 x Per Week/30 Days Discharge Instructions: Gently cleanse wound with antibacterial soap, rinse and pat dry prior to dressing wounds Cleanser: Wound Cleanser 3 x Per Week/30 Days Discharge Instructions: Wash your hands with soap and water. Remove old dressing, discard into plastic bag and place into trash. Cleanse the wound with Wound Cleanser prior to applying a clean dressing using gauze sponges, not tissues or cotton balls. Do not scrub or use excessive force. Pat dry using gauze sponges, not tissue or cotton balls. Prim Dressing: Silvercel Small 2x2 (in/in) 3 x Per Week/30 Days ary Discharge Instructions: Apply Silvercel Small 2x2 (in/in) as instructed Secondary Dressing: Zetuvit Plus 4x8 (in/in) 3 x Per Week/30 Days Com pression Wrap: UNNA BOOT - Unna-Chad [name] Zinc and Calamine Boot, 4x10 (in/yd) 3 x Per Week/30 Days Discharge Instructions: Unna Paste, Kerlix and Coban from base of toes to three finger widths below bend in knee. WOUND #17: - Lower Leg Wound Laterality: Left, Anterior Cleanser: Soap and Water 3 x Per Week/30 Days Discharge Instructions: Gently cleanse wound with antibacterial soap, rinse and pat dry prior to dressing wounds Cleanser: Wound Cleanser 3 x Per Week/30 Days Discharge Instructions: Wash your hands with soap and water. Remove old dressing, discard into plastic bag and place into trash. Cleanse the wound with Wound Cleanser prior to applying a clean dressing using gauze sponges, not tissues or cotton balls. Do not scrub or use excessive force. Pat dry using gauze sponges, not tissue or cotton balls. Prim Dressing: Silvercel Small 2x2 (in/in) 3 x Per Week/30 Days ary Discharge Instructions: Apply Silvercel Small 2x2 (in/in) as instructed Secondary Dressing: Zetuvit Plus 4x8 (in/in) 3 x Per Week/30 Days Com pression Wrap: UNNA BOOT - Unna-Chad [name] Zinc and Calamine Boot, 4x10 (in/yd) 3 x Per Week/30 Days Discharge Instructions: Unna Paste, Kerlix and Coban from base  of toes to three finger widths below bend in knee. WOUND #18: - T Second Wound Laterality: Right, Medial oe Cleanser: Soap and Water Every Other Day/30 Days Discharge Instructions: Gently cleanse wound with antibacterial soap, rinse and pat dry prior to dressing wounds Cleanser: Wound Cleanser Every Other Day/30 Days Discharge Instructions: Wash your hands with soap and water. Remove old dressing, discard into plastic bag and place into trash. Cleanse the wound with Wound Cleanser prior to applying a clean dressing using gauze sponges, not tissues or cotton balls. Do not scrub or use excessive force. Pat dry using gauze sponges, not tissue or cotton balls. Prim Dressing: Silvercel Small 2x2 (in/in) Every Other Day/30 Days ary Discharge Instructions: Apply Silvercel Small 2x2 (in/in) as instructed Secondary Dressing: Coverlet Latex-Free Fabric Adhesive Dressings (Generic) Every Other Day/30 Days Discharge Instructions: Knuckle WOUND #19: - Lower Leg Wound Laterality: Left, Posterior Cleanser: Soap and Water 3 x Per Week/30 Days Discharge Instructions: Gently cleanse wound with antibacterial soap, rinse and pat dry prior to dressing wounds Cleanser: Wound Cleanser 3 x Per Week/30 Days Discharge Instructions: Wash your hands with soap and water. Remove old dressing, discard into plastic bag and place into trash. Cleanse the wound with Wound Cleanser prior to applying a clean dressing using gauze sponges, not tissues or cotton balls. Do not scrub or use excessive force.  Pat dry using gauze sponges, not tissue or cotton balls. Prim Dressing: Silvercel Small 2x2 (in/in) 3 x Per Week/30 Days ary Discharge Instructions: Apply Silvercel Small 2x2 (in/in) as instructed Secondary Dressing: Zetuvit Plus 4x8 (in/in) 3 x Per Week/30 Days Com pression Wrap: UNNA BOOT - Unna-Chad [name] Zinc and Calamine Boot, 4x10 (in/yd) 3 x Per Week/30 Days Discharge Instructions: Unna Paste, Kerlix and Coban from base of toes to  three finger widths below bend in knee. 1. I would recommend currently based on what we are seeing that we have the patient continue to monitor for any signs of infection or worsening. Based on what I am seeing I do believe that we are making better progress with Unna boot wrap that we have for quite some time. I am hopeful that we will continue to see signs of progress going forward and he is in agreement with continue with Unna boot wraps even though he hates it. 2. I am going to recommend as well that the patient should continue to elevate his legs much as possible and obviously the best thing at this point is going to be the [employer] wraps which she has agreed to continue with and stick out he does want to get better and I think this is best shot to do so. We will see patient back for reevaluation in 1 week here in the clinic. If anything worsens or changes patient will contact our office for additional recommendations. IZAAC, [name] ([id number]) [id number] Page 20 of 20 Electronic Signature(s) Signed: 01/27/2023 1:27:18 PM By: [name] PA-C Entered By: [name] on 01/27/2023 13:27:18 -------------------------------------------------------------------------------- SuperBill Details Patient Name: Date of Service: KASHIEF, NOVEMBER 01/27/2023 Medical Record Number: [id number] Patient Account Number: [id number] Date of Birth/Sex: Treating RN: February 21, 1961 (62 y.o. 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 cerebral infarction affecting left non-dominant side 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, stage 3 Facility Procedures : CPT4 Code: [id number] Description: 11042 - DEB SUBQ TISSUE 20 SQ CM/< ICD-10 Diagnosis Description 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 L89.613 Pressure ulcer  of right heel, stage 3 Modifier: Quantity: 1 Physician Procedures : CPT4 Code Description Modifier [id number] 11042 - WC PHYS SUBQ TISS 20 SQ CM ICD-10 Diagnosis Description L97.322 Non-pressure chronic ulcer of left ankle with fat layer exposed L98.492 Non-pressure chronic ulcer of skin of other sites with fat layer  exposed 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, stage 3 Quantity: 1 Electronic Signature(s) Signed: 01/27/2023 4:40:51 PM By: Midge Aver MSN RN CNS WTA Signed: 01/27/2023 5:13:07 PM By: [name] PA-C Previous Signature: 01/27/2023 1:27:40 PM Version By: [name] PA-C Entered By: Midge Aver on 01/27/2023 16:40:50

## 2023-02-02 NOTE — Progress Notes (Signed)
Chad [name] ([id number]) [id number] Page 1 of 21 Visit Report for 01/27/2023 Arrival Information Details Patient Name: Date of Service: Chad [name], Chad [name] 01/27/2023 12:15 PM Medical Record Number: [id number] Patient Account Number: [id number] Date of Birth/Sex: Treating RN: [birth year] (62 y.o. Chad [name] Primary Care Sierrah Luevano: [name] Other Clinician: Referring Jacquette Canales: Treating Chad [name]/Extender: [name] in Treatment: 57 Visit Information History Since Last Visit Added or deleted any medications: No Patient Arrived: Wheel Chair Any new allergies or adverse reactions: No Arrival Time: 12:14 Has Dressing in Place as Prescribed: Yes Accompanied By: self Has Compression in Place as Prescribed: Yes Transfer Assistance: None Pain Present Now: No Patient Identification Verified: Yes Secondary Verification Process Completed: Yes Patient Requires Transmission-Based No Precautions: Patient Has Alerts: Yes Patient Alerts: Patient on Blood Thinner NOT diabetic aspirin 81mg  Lives [facility] ABI R 0.81 11/05/22 ABI L 0.59 11/05/22 Electronic Signature(s) Signed: 01/27/2023 4:38:23 PM By: Midge Aver MSN RN CNS WTA Entered By: Midge Aver on 01/27/2023 16:38:23 -------------------------------------------------------------------------------- Clinic Level of Care Assessment Details Patient Name: Date of Service: Chad [name] 01/27/2023 12:15 PM Medical Record Number: [id number] Patient Account Number: [id number] Date of Birth/Sex: Treating RN: Dec 16, 1960 (62 y.o. Chad [name] Primary Care Caven Perine: [name] Other Clinician: Referring Agnieszka Newhouse: Treating Marteze Vecchio/Extender: [name] Weeks in Treatment: 59 Clinic Level of Care Assessment Items TOOL 1 Quantity Score []  - 0 Use when EandM and Procedure is performed on INITIAL visit ASSESSMENTS - Nursing Assessment / Reassessment []  - 0 General  Physical Exam (combine w/ comprehensive assessment (listed just below) when performed on new pt. [address]LEWEY, CHEEVES ([id number]) [id number] Page 2 of 21 []  - 0 Comprehensive Assessment (HX, ROS, Risk Assessments, Wounds Hx, etc.) ASSESSMENTS - Wound and Skin Assessment / Reassessment []  - 0 Dermatologic / Skin Assessment (not related to wound area) ASSESSMENTS - Ostomy and/or Continence Assessment and Care []  - 0 Incontinence Assessment and Management []  - 0 Ostomy Care Assessment and Management (repouching, etc.) PROCESS - Coordination of Care []  - 0 Simple Patient / Family Education for ongoing care []  - 0 Complex (extensive) Patient / Family Education for ongoing care []  - 0 Staff obtains Chiropractor, Records, T Results / Process Orders est []  - 0 Staff telephones HHA, Nursing Homes / Clarify orders / etc []  - 0 Routine Transfer to another Facility (non-emergent condition) []  - 0 Routine Hospital Admission (non-emergent condition) []  - 0 New Admissions / Manufacturing engineer / Ordering NPWT Apligraf, etc. , []  - 0 Emergency Hospital Admission (emergent condition) PROCESS - Special Needs []  - 0 Pediatric / Minor Patient Management []  - 0 Isolation Patient Management []  - 0 Hearing / Language / Visual special needs []  - 0 Assessment of Community assistance (transportation, D/C planning, etc.) []  - 0 Additional assistance / Altered mentation []  - 0 Support Surface(s) Assessment (bed, cushion, seat, etc.) INTERVENTIONS - Miscellaneous []  - 0 External ear exam []  - 0 Patient Transfer (multiple staff / Nurse, adult / Similar devices) []  - 0 Simple Staple / Suture removal (25 or less) []  - 0 Complex Staple / Suture removal (26 or more) []  - 0 Hypo/Hyperglycemic Management (do not check if billed separately) []  - 0 Ankle / Brachial Index (ABI) - do not check if billed separately Has the patient been seen at the hospital within the last  three years: Yes Total Score: 0 Level Of Care: ____ Electronic Signature(s) Signed: 02/02/2023 5:27:27 PM By: Midge Aver MSN RN CNS WTA  Entered By: Midge Aver on 01/27/2023 16:40:43 -------------------------------------------------------------------------------- Compression Therapy Details Patient Name: Date of Service: Chad [name], Chad [name] 01/27/2023 12:15 PM Medical Record Number: [id number] Patient Account Number: [id number] Date of Birth/Sex: Treating RN: August 29, 1960 (62 y.o. Chad [name], Chad [name] ([id number]) [id number] Page 3 of 21 Primary Care Yasheka Fossett: [name] Other Clinician: Referring Mckinna Demars: Treating Oluwatobi Visser/Extender: [name] Weeks in Treatment: 59 Compression Therapy Performed for Wound Assessment: Wound #12 Left,Distal,Medial Ankle Performed By: Clinician Midge Aver, RN Compression Type: Henriette Combs Post Procedure Diagnosis Same as Pre-procedure Electronic Signature(s) Signed: 02/02/2023 5:27:27 PM By: Midge Aver MSN RN CNS WTA Entered By: Midge Aver on 01/27/2023 13:24:42 -------------------------------------------------------------------------------- Compression Therapy Details Patient Name: Date of Service: Chad [name], Chad [name] 01/27/2023 12:15 PM Medical Record Number: [id number] Patient Account Number: [id number] Date of Birth/Sex: Treating RN: [birth year] (62 y.o. Chad [name] Primary Care Michol Emory: [name] Other Clinician: Referring Dartanyon Frankowski: Treating Tanijah Morais/Extender: [name] Weeks in Treatment: 59 Compression Therapy Performed for Wound Assessment: Wound #15 Left,Dorsal Foot Performed By: Clinician Midge Aver, RN Compression Type: Henriette Combs Post Procedure Diagnosis Same as Pre-procedure Electronic Signature(s) Signed: 02/02/2023 5:27:27 PM By: Midge Aver MSN RN CNS WTA Entered By: Midge Aver on 01/27/2023  13:24:42 -------------------------------------------------------------------------------- Compression Therapy Details Patient Name: Date of Service: Chad [name], Chad [name] 01/27/2023 12:15 PM Medical Record Number: [id number] Patient Account Number: [id number] Date of Birth/Sex: Treating RN: 11-25-1960 (62 y.o. Chad [name] Primary Care Kida Digiulio: [name] Other Clinician: Referring Marisah Laker: Treating Lilliane Sposito/Extender: [name] Weeks in Treatment: 59 Compression Therapy Performed for Wound Assessment: Wound #16 Right Calcaneus Performed By: Clinician Midge Aver, RN Compression Type: Henriette Combs [place], Chad [name] ([id number]) [id number] Page 4 of 21 Post Procedure Diagnosis Same as Pre-procedure Electronic Signature(s) Signed: 02/02/2023 5:27:27 PM By: Midge Aver MSN RN CNS WTA Entered By: Midge Aver on 01/27/2023 13:24:42 -------------------------------------------------------------------------------- Compression Therapy Details Patient Name: Date of Service: Chad [name], Chad [name] 01/27/2023 12:15 PM Medical Record Number: [id number] Patient Account Number: [id number] Date of Birth/Sex: Treating RN: Nov 18, 1960 (62 y.o. Chad [name] Primary Care Evani Shrider: [name] Other Clinician: Referring Charly Holcomb: Treating Crestina Strike/Extender: [name] Weeks in Treatment: 59 Compression Therapy Performed for Wound Assessment: Wound #17 Left,Anterior Lower Leg Performed By: Clinician Midge Aver, RN Compression Type: Henriette Combs Post Procedure Diagnosis Same as Pre-procedure Electronic Signature(s) Signed: 02/02/2023 5:27:27 PM By: Midge Aver MSN RN CNS WTA Entered By: Midge Aver on 01/27/2023 13:24:42 -------------------------------------------------------------------------------- Compression Therapy Details Patient Name: Date of Service: Chad [name], Chad [name] 01/27/2023 12:15 PM Medical Record Number: [id number] Patient Account  Number: [id number] Date of Birth/Sex: Treating RN: 12/07/1960 (62 y.o. Chad [name] Primary Care Kacin Dancy: [name] Other Clinician: Referring Lonnel Gjerde: Treating Darya Bigler/Extender: [name] Weeks in Treatment: 59 Compression Therapy Performed for Wound Assessment: Wound #18 Right,Medial T Second oe Performed By: Raj Janus, RN Compression Type: Henriette Combs Post Procedure Diagnosis Same as Pre-procedure Electronic Signature(s) Signed: 02/02/2023 5:27:27 PM By: Midge Aver MSN RN CNS Darnelle Going, Chad [name] ([id number]) [id number] Page 5 of 21 Entered By: Midge Aver on 01/27/2023 13:24:42 -------------------------------------------------------------------------------- Compression Therapy Details Patient Name: Date of Service: Chad [name], Chad [name] 01/27/2023 12:15 PM Medical Record Number: [id number] Patient Account Number: [id number] Date of Birth/Sex: Treating RN: May 23, 1961 (62 y.o. Chad [name] Primary Care Dierdre Mccalip: [name] Other Clinician: Referring Jasmin Trumbull: Treating Azari Janssens/Extender: [name] Weeks in Treatment: 59 Compression Therapy Performed for Wound Assessment: Wound #19 Left,Posterior Lower Leg Performed By: Clinician Midge Aver,  RN Compression Type: Henriette Combs Post Procedure Diagnosis Same as Architectural technologist) Signed: 02/02/2023 5:27:27 PM By: Midge Aver MSN RN CNS WTA Entered By: Midge Aver on 01/27/2023 13:24:42 -------------------------------------------------------------------------------- Encounter Discharge Information Details Patient Name: Date of Service: Chad [name], Chad [name] 01/27/2023 12:15 PM Medical Record Number: [id number] Patient Account Number: [id number] Date of Birth/Sex: Treating RN: October 24, 1960 (62 y.o. Chad [name] Primary Care Shantell Belongia: [name] Other Clinician: Referring Murle Otting: Treating Roben Tatsch/Extender: [name] Weeks in Treatment: 71 Encounter Discharge Information Items Post Procedure Vitals Discharge Condition: Stable Temperature (F): 98.1 Ambulatory Status: Wheelchair Pulse (bpm): 92 Discharge Destination: Home Respiratory Rate (breaths/min): 18 Transportation: Private Auto Blood Pressure (mmHg): 135/83 Accompanied By: self Schedule Follow-up Appointment: Yes Clinical Summary of Care: Electronic Signature(s) Signed: 01/27/2023 4:41:58 PM By: Midge Aver MSN RN CNS WTA Entered By: Midge Aver on 01/27/2023 16:41:58 [name] ([id number]) [id number] Page 6 of 21 -------------------------------------------------------------------------------- Lower Extremity Assessment Details Patient Name: Date of Service: Chad [name], Chad [name] 01/27/2023 12:15 PM Medical Record Number: [id number] Patient Account Number: [id number] Date of Birth/Sex: Treating RN: 10-Jun-1961 (62 y.o. Chad [name] Primary Care Dayana Dalporto: [name] Other Clinician: Referring Bhavya Eschete: Treating Woody Kronberg/Extender: [name] Weeks in Treatment: 59 Edema Assessment Assessed: Kyra Searles: Yes] Franne Forts: Yes] [Left: Edema] [Right: :] Calf Left: Right: Point of Measurement: 33 cm From Medial Instep 36 cm 36 cm Ankle Left: Right: Point of Measurement: 12 cm From Medial Instep 23 cm 22.4 cm Vascular Assessment Pulses: Dorsalis Pedis Palpable: [Left:Yes] [Right:Yes] Electronic Signature(s) Signed: 01/27/2023 4:38:47 PM By: Midge Aver MSN RN CNS WTA Entered By: Midge Aver on 01/27/2023 16:38:47 -------------------------------------------------------------------------------- Multi Wound Chart Details Patient Name: Date of Service: Chad [name] 01/27/2023 12:15 PM Medical Record Number: [id number] Patient Account Number: [id number] Date of Birth/Sex: Treating RN: Jan 18, 1961 (62 y.o. Chad [name] Primary Care Adoni Greenough: [name] Other Clinician: Referring  Jayel Scaduto: Treating Erian Lariviere/Extender: [name] Weeks in Treatment: 75 Vital Signs Height(in): 69 Pulse(bpm): 92 Weight(lbs): 150 Blood Pressure(mmHg): 135/83 Body Mass Index(BMI): 22.1 Temperature(F): 98.1 Respiratory Rate(breaths/min): 16 Mkrtchyan, Jw ([id number]) [id number] Page 7 of 21 [12:Photos:] Left, Distal, Medial Ankle Left, Dorsal Foot Right Calcaneus Wound Location: Gradually Appeared Gradually Appeared Gradually Appeared Wounding Event: Venous Leg Ulcer Venous Leg Ulcer Pressure Ulcer Primary Etiology: Anemia, Chronic Obstructive Anemia, Chronic Obstructive Anemia, Chronic Obstructive Comorbid History: Pulmonary Disease (COPD), Coronary Pulmonary Disease (COPD), Coronary Pulmonary Disease (COPD), Coronary Artery Disease, Peripheral Arterial Artery Disease, Peripheral Arterial Artery Disease, Peripheral Arterial Disease, Peripheral Venous Disease, Disease, Peripheral Venous Disease, Disease, Peripheral Venous Disease, Hepatitis C, Osteoarthritis, Hepatitis C, Osteoarthritis, Hepatitis C, Osteoarthritis, Neuropathy, Received Chemotherapy Neuropathy, Received Chemotherapy Neuropathy, Received Chemotherapy 07/12/2019 10/01/2022 09/26/2022 Date Acquired: 59 16 16 Weeks of Treatment: Open Open Open Wound Status: No No No Wound Recurrence: No Yes Yes Clustered Wound: 2.3x0.7x0.2 4.4x1x0.1 2x1.2x0.2 Measurements L x W x D (cm) 1.264 3.456 1.885 A (cm) : rea 0.253 0.346 0.377 Volume (cm) : 81.70% -46.70% -100.10% % Reduction in A rea: 81.70% -46.60% -100.50% % Reduction in Volume: Full Thickness Without Exposed Full Thickness Without Exposed Category/Stage III Classification: Support Structures Support Structures Medium Medium Medium Exudate A mount: Serosanguineous Serosanguineous Serosanguineous Exudate Type: red, brown red, brown red, brown Exudate Color: N/A None Present (0%) Small (1-33%) Granulation A  mount: N/A N/A Pink Granulation Quality: N/A Large (67-100%) Large (67-100%) Necrotic A mount: N/A None None Epithelialization: Debridement - Excisional N/A Debridement - Excisional Debridement: Pre-procedure Verification/Time Out 13:11 N/A 13:11 Taken: Lidocaine  4% Topical Solution N/A Lidocaine 4% Topical Solution Pain Control: Subcutaneous, Slough N/A Subcutaneous, Slough Tissue Debrided: Skin/Subcutaneous Tissue N/A Skin/Subcutaneous Tissue Level: 1.26 N/A 1.88 Debridement A (sq cm): rea Curette N/A Curette Instrument: Moderate N/A Moderate Bleeding: Pressure N/A Pressure Hemostasis A chieved: 0 N/A 0 Procedural Pain: 0 N/A 0 Post Procedural Pain: Procedure was tolerated well N/A Procedure was tolerated well Debridement Treatment Response: 2.3x0.7x0.2 N/A 2x1.2x0.2 Post Debridement Measurements L x W x D (cm) 0.253 N/A 0.377 Post Debridement Volume: (cm) N/A N/A Category/Stage III Post Debridement Stage: Compression Therapy Compression Therapy Compression Therapy Procedures Performed: Debridement Debridement Wound Number: [id number]  Photos: Left, Anterior Lower Leg Right, Medial T Second oe Left, Posterior Lower Leg Wound Location: Pressure Injury Pressure Injury Gradually Appeared Wounding Event: Pressure Ulcer Pressure Ulcer Venous Leg Ulcer Primary Etiology: Anemia, Chronic Obstructive Anemia, Chronic Obstructive Anemia, Chronic Obstructive Comorbid History: Pulmonary Disease (COPD), Coronary Pulmonary Disease (COPD), Coronary Pulmonary Disease (COPD), Coronary Artery Disease, Peripheral Arterial Artery Disease, Peripheral Arterial Artery Disease, Peripheral Arterial Disease, Peripheral Venous Disease, Disease, Peripheral Venous Disease, Disease, Peripheral Venous Disease, Hepatitis C, Osteoarthritis, Hepatitis C, Osteoarthritis, Hepatitis C, Osteoarthritis, Neuropathy, Received Chemotherapy Neuropathy, Received Chemotherapy Neuropathy, Received  Chemotherapy 09/30/2022 09/26/2022 11/10/2022 Date Acquired: 15 15 10  Weeks of Treatment: Open Open Open Wound Status: No No No Wound Recurrence: VIVIANO, FLADGER ([id number]) [id number] Page 8 of 21 No No Yes Clustered Wound: 3.5x2.5x0.2 1x0.5x0.1 3x1.5x0.1 Measurements L x W x D (cm) 6.872 0.393 3.534 A (cm) : rea 1.374 0.039 0.353 Volume (cm) : -66.70% 28.50% -399.90% % Reduction in Area: -233.50% 29.10% -397.20% % Reduction in Volume: Category/Stage II Category/Stage II Full Thickness Without Exposed Classification: Support Structures Medium Medium Medium Exudate A mount: Serosanguineous Serosanguineous Serous Exudate Type: red, brown red, brown amber Exudate Color: None Present (0%) None Present (0%) None Present (0%) Granulation A mount: N/A N/A N/A Granulation Quality: Large (67-100%) Large (67-100%) Large (67-100%) Necrotic A mount: None None None Epithelialization: Debridement - Excisional Debridement - Excisional Debridement - Excisional Debridement: 13:11 13:11 13:11 Pre-procedure Verification/Time Out Taken: Lidocaine 4% T opical Solution Lidocaine 4% Topical Solution Lidocaine 4% Topical Solution Pain Control: Subcutaneous, Slough Subcutaneous, Slough Subcutaneous, Slough Tissue Debrided: Skin/Subcutaneous Tissue Skin/Subcutaneous Tissue Skin/Subcutaneous Tissue Level: 6.87 0.39 3.53 Debridement A (sq cm): rea Curette Curette Curette Instrument: Moderate Moderate Moderate Bleeding: Silver Nitrate Pressure Pressure Hemostasis A chieved: 0 0 0 Procedural Pain: 0 0 0 Post Procedural Pain: Procedure was tolerated well Procedure was tolerated well Procedure was tolerated well Debridement Treatment Response: 3.5x2.5x0.2 1x0.5x0.1 3x1.5x0.1 Post Debridement Measurements L x W x D (cm) 1.374 0.039 0.353 Post Debridement Volume: (cm) Category/Stage II Category/Stage II N/A Post Debridement Stage: Compression Therapy  Compression Therapy Compression Therapy Procedures Performed: Debridement Debridement Debridement Treatment Notes Wound #12 (Ankle) Wound Laterality: Left, Medial, Distal Cleanser Soap and Water Discharge Instruction: Gently cleanse wound with antibacterial soap, rinse and pat dry prior to dressing wounds Wound Cleanser Discharge Instruction: Wash your hands with soap and water. Remove old dressing, discard into plastic bag and place into trash. Cleanse the wound with Wound Cleanser prior to applying a clean dressing using gauze sponges, not tissues or cotton balls. Do not scrub or use excessive force. Pat dry using gauze sponges, not tissue or cotton balls. Peri-Wound Care Topical Primary Dressing Silvercel Small 2x2 (in/in) Discharge Instruction: Apply Silvercel Small 2x2 (in/in) as instructed Secondary Dressing Zetuvit Plus 4x8 (in/in) Secured With Compression Wrap UNNA BOOT - Unna-Z Zinc and Calamine Boot, 4x10 (in/yd)  Discharge Instruction: Melony Overly, Kerlix and Coban from base of toes to three finger widths below bend in knee. Compression Stockings Add-Ons Wound #15 (Foot) Wound Laterality: Dorsal, Left Cleanser Soap and Water Discharge Instruction: Gently cleanse wound with antibacterial soap, rinse and pat dry prior to dressing wounds Wound Cleanser Discharge Instruction: Wash your hands with soap and water. Remove old dressing, discard into plastic bag and place into trash. Cleanse the wound with Wound Cleanser prior to applying a clean dressing using gauze sponges, not tissues or cotton balls. Do not scrub or use excessive force. Pat dry using gauze sponges, not tissue or cotton balls. Peri-Wound Care Topical TRADEN, ARNZEN ([id number]) [id number] Page 9 of 21 Primary Dressing Silvercel Small 2x2 (in/in) Discharge Instruction: Apply Silvercel Small 2x2 (in/in) as instructed Secondary Dressing Zetuvit Plus 4x8 (in/in) Secured  With Compression Wrap UNNA BOOT - Unna-Z Zinc and Calamine Boot, 4x10 (in/yd) Discharge Instruction: Unna Paste, Kerlix and Coban from base of toes to three finger widths below bend in knee. Compression Stockings Add-Ons Wound #16 (Calcaneus) Wound Laterality: Right Cleanser Soap and Water Discharge Instruction: Gently cleanse wound with antibacterial soap, rinse and pat dry prior to dressing wounds Wound Cleanser Discharge Instruction: Wash your hands with soap and water. Remove old dressing, discard into plastic bag and place into trash. Cleanse the wound with Wound Cleanser prior to applying a clean dressing using gauze sponges, not tissues or cotton balls. Do not scrub or use excessive force. Pat dry using gauze sponges, not tissue or cotton balls. Peri-Wound Care Topical Primary Dressing Silvercel Small 2x2 (in/in) Discharge Instruction: Apply Silvercel Small 2x2 (in/in) as instructed Secondary Dressing Zetuvit Plus 4x8 (in/in) Secured With Compression Wrap UNNA BOOT - Unna-Z Zinc and Calamine Boot, 4x10 (in/yd) Discharge Instruction: Unna Paste, Kerlix and Coban from base of toes to three finger widths below bend in knee. Compression Stockings Add-Ons Wound #17 (Lower Leg) Wound Laterality: Left, Anterior Cleanser Soap and Water Discharge Instruction: Gently cleanse wound with antibacterial soap, rinse and pat dry prior to dressing wounds Wound Cleanser Discharge Instruction: Wash your hands with soap and water. Remove old dressing, discard into plastic bag and place into trash. Cleanse the wound with Wound Cleanser prior to applying a clean dressing using gauze sponges, not tissues or cotton balls. Do not scrub or use excessive force. Pat dry using gauze sponges, not tissue or cotton balls. Peri-Wound Care Topical Primary Dressing Silvercel Small 2x2 (in/in) Discharge Instruction: Apply Silvercel Small 2x2 (in/in) as instructed Secondary Dressing Zetuvit Plus 4x8  (in/in) Secured With Compression Wrap UNNA BOOT - Unna-Z Zinc and Calamine Boot, 4x10 (in/yd) Discharge Instruction: Unna Paste, Kerlix and Coban from base of toes to three finger widths below bend in knee. Compression Stockings Add-Ons DARLIN, GEER ([id number]) [id number] Page 10 of 21 Wound #18 (Toe Second) Wound Laterality: Right, Medial Cleanser Soap and Water Discharge Instruction: Gently cleanse wound with antibacterial soap, rinse and pat dry prior to dressing wounds Wound Cleanser Discharge Instruction: Wash your hands with soap and water. Remove old dressing, discard into plastic bag and place into trash. Cleanse the wound with Wound Cleanser prior to applying a clean dressing using gauze sponges, not tissues or cotton balls. Do not scrub or use excessive force. Pat dry using gauze sponges, not tissue or cotton balls. Peri-Wound Care Topical Primary Dressing Silvercel Small 2x2 (in/in) Discharge Instruction: Apply Silvercel Small 2x2 (in/in) as instructed Secondary Dressing Coverlet Latex-Free Fabric Adhesive Dressings Discharge Instruction: Knuckle Secured With Compression Wrap Compression Stockings Add-Ons  Wound #19 (Lower Leg) Wound Laterality: Left, Posterior Cleanser Soap and Water Discharge Instruction: Gently cleanse wound with antibacterial soap, rinse and pat dry prior to dressing wounds Wound Cleanser Discharge Instruction: Wash your hands with soap and water. Remove old dressing, discard into plastic bag and place into trash. Cleanse the wound with Wound Cleanser prior to applying a clean dressing using gauze sponges, not tissues or cotton balls. Do not scrub or use excessive force. Pat dry using gauze sponges, not tissue or cotton balls. Peri-Wound Care Topical Primary Dressing Silvercel Small 2x2 (in/in) Discharge Instruction: Apply Silvercel Small 2x2 (in/in) as instructed Secondary Dressing Zetuvit Plus 4x8  (in/in) Secured With Compression Wrap UNNA BOOT - Unna-Z Zinc and Calamine Boot, 4x10 (in/yd) Discharge Instruction: Unna Paste, Kerlix and Coban from base of toes to three finger widths below bend in knee. Compression Stockings Add-Ons Electronic Signature(s) Signed: 01/27/2023 4:39:20 PM By: Midge Aver MSN RN CNS WTA Entered By: Midge Aver on 01/27/2023 16:39:20 [name] ([id number]) [id number] Page 11 of 21 -------------------------------------------------------------------------------- Multi-Disciplinary Care Plan Details Patient Name: Date of Service: Chad [name], Chad [name] 01/27/2023 12:15 PM Medical Record Number: [id number] Patient Account Number: [id number] Date of Birth/Sex: Treating RN: 06/06/1961 (62 y.o. Chad [name] Primary Care Sahmya Arai: [name] Other Clinician: Referring Jeanean Hollett: Treating Gyanna Jarema/Extender: [name] Weeks in Treatment: 59 Active Inactive Venous Leg Ulcer Nursing Diagnoses: Knowledge deficit related to disease process and management Goals: Patient will maintain optimal edema control Date Initiated: 12/06/2021 Target Resolution Date: 02/22/2022 Goal Status: Active Patient/caregiver will verbalize understanding of disease process and disease management Date Initiated: 12/06/2021 Date Inactivated: 12/19/2022 Target Resolution Date: 11/27/2022 Goal Status: Met Interventions: Assess peripheral edema status every visit. Compression as ordered Notes: Electronic Signature(s) Signed: 01/27/2023 4:41:03 PM By: Midge Aver MSN RN CNS WTA Entered By: Midge Aver on 01/27/2023 16:41:02 -------------------------------------------------------------------------------- Pain Assessment Details Patient Name: Date of Service: Chad [name], Chad [name] 01/27/2023 12:15 PM Medical Record Number: [id number] Patient Account Number: [id number] Date of Birth/Sex: Treating RN: September 03, 1960 (62 y.o. Chad [name] Primary Care  Reshard Guillet: [name] Other Clinician: Referring Ramaya Guile: Treating Abdul Beirne/Extender: [name] Weeks in Treatment: 66 Active Problems Location of Pain Severity and Description of Pain Patient Has Paino No Site Locations [place], [place] ([id number]) [id number] Page 12 of 21 Pain Management and Medication Current Pain Management: Electronic Signature(s) Signed: 01/27/2023 4:38:32 PM By: Midge Aver MSN RN CNS WTA Entered By: Midge Aver on 01/27/2023 16:38:32 -------------------------------------------------------------------------------- Patient/Caregiver Education Details Patient Name: Date of Service: Denna Haggard 7/2/2024andnbsp12:15 PM Medical Record Number: [id number] Patient Account Number: [id number] Date of Birth/Gender: Treating RN: [birth year] (62 y.o. Chad [name] Primary Care Physician: [name] Other Clinician: Referring Physician: Treating Physician/Extender: [name] in Treatment: 48 Education Assessment Education Provided To: Patient Education Topics Provided Wound Debridement: Handouts: Wound Debridement Methods: Explain/Verbal Responses: State content correctly Wound/Skin Impairment: Handouts: Caring for Your Ulcer Methods: Explain/Verbal Responses: State content correctly Electronic Signature(s) Signed: 02/02/2023 5:27:27 PM By: Midge Aver MSN RN CNS WTA Entered By: Midge Aver on 01/27/2023 16:41:08 [name] ([id number]) [id number] Page 13 of 21 -------------------------------------------------------------------------------- Wound Assessment Details Patient Name: Date of Service: Chad [name], Chad [name] 01/27/2023 12:15 PM Medical Record Number: [id number] Patient Account Number: [id number] Date of Birth/Sex: Treating RN: 29-Jul-1960 (62 y.o. Chad [name] Primary Care Kiosha Buchan: [name] Other Clinician: Referring Jeremy Mclamb: Treating  Brentton Wardlow/Extender: [name] Weeks in Treatment: 59 Wound Status Wound Number: 12 Primary Venous Leg Ulcer Etiology: Wound Location:  Left, Distal, Medial Ankle Wound Open Wounding Event: Gradually Appeared Status: Date Acquired: 07/12/2019 Comorbid Anemia, Chronic Obstructive Pulmonary Disease (COPD), Coronary Weeks Of Treatment: 59 History: Artery Disease, Peripheral Arterial Disease, Peripheral Venous Clustered Wound: No Disease, Hepatitis C, Osteoarthritis, Neuropathy, Received Chemotherapy Photos Wound Measurements Length: (cm) 2.3 Width: (cm) 0.7 Depth: (cm) 0.2 Area: (cm) 1.264 Volume: (cm) 0.253 % Reduction in Area: 81.7% % Reduction in Volume: 81.7% Wound Description Classification: Full Thickness Without Exposed Support Exudate Amount: Medium Exudate Type: Serosanguineous Exudate Color: red, brown Structures Treatment Notes Wound #12 (Ankle) Wound Laterality: Left, Medial, Distal Cleanser Soap and Water Discharge Instruction: Gently cleanse wound with antibacterial soap, rinse and pat dry prior to dressing wounds Wound Cleanser Discharge Instruction: Wash your hands with soap and water. Remove old dressing, discard into plastic bag and place into trash. Cleanse the wound with Wound Cleanser prior to applying a clean dressing using gauze sponges, not tissues or cotton balls. Do not scrub or use excessive force. Pat dry using gauze sponges, not tissue or cotton balls. Peri-Wound Care Topical Primary Dressing ARSHDEEP, FRUCHEY ([id number]) [id number] Page 14 of 21 Silvercel Small 2x2 (in/in) Discharge Instruction: Apply Silvercel Small 2x2 (in/in) as instructed Secondary Dressing Zetuvit Plus 4x8 (in/in) Secured With Compression Wrap UNNA BOOT - Unna-Z Zinc and Calamine Boot, 4x10 (in/yd) Discharge Instruction: Unna Paste, Kerlix and Coban from base of toes to three finger widths below bend in knee. Compression  Stockings Add-Ons Electronic Signature(s) Signed: 02/02/2023 5:27:27 PM By: Midge Aver MSN RN CNS WTA Entered By: Midge Aver on 01/27/2023 12:45:18 -------------------------------------------------------------------------------- Wound Assessment Details Patient Name: Date of Service: DEVONTEZ, ASCHER 01/27/2023 12:15 PM Medical Record Number: [id number] Patient Account Number: [id number] Date of Birth/Sex: Treating RN: 1961-05-19 (62 y.o. Chad [name] Primary Care Bassheva Flury: [name] Other Clinician: Referring Json Koelzer: Treating Hurshel Bouillon/Extender: [name] Weeks in Treatment: 59 Wound Status Wound Number: 15 Primary Venous Leg Ulcer Etiology: Wound Location: Left, Dorsal Foot Wound Open Wounding Event: Gradually Appeared Status: Date Acquired: 10/01/2022 Comorbid Anemia, Chronic Obstructive Pulmonary Disease (COPD), Coronary Weeks Of Treatment: 16 History: Artery Disease, Peripheral Arterial Disease, Peripheral Venous Clustered Wound: Yes Disease, Hepatitis C, Osteoarthritis, Neuropathy, Received Chemotherapy Photos Wound Measurements Length: (cm) 4.4 Width: (cm) 1 Depth: (cm) 0.1 Area: (cm) 3.456 Volume: (cm) 0.346 % Reduction in Area: -46.7% % Reduction in Volume: -46.6% Epithelialization: None Wound Description Classification: Full Thickness Without Exposed Support Structures Tofte, Chad [name] ([id number]) Exudate Amount: Medium Exudate Type: Serosanguineous Exudate Color: red, brown [id number] Page 15 of 21 Wound Bed Granulation Amount: None Present (0%) Necrotic Amount: Large (67-100%) Necrotic Quality: Adherent Slough Treatment Notes Wound #15 (Foot) Wound Laterality: Dorsal, Left Cleanser Soap and Water Discharge Instruction: Gently cleanse wound with antibacterial soap, rinse and pat dry prior to dressing wounds Wound Cleanser Discharge Instruction: Wash your hands with soap and water. Remove old dressing,  discard into plastic bag and place into trash. Cleanse the wound with Wound Cleanser prior to applying a clean dressing using gauze sponges, not tissues or cotton balls. Do not scrub or use excessive force. Pat dry using gauze sponges, not tissue or cotton balls. Peri-Wound Care Topical Primary Dressing Silvercel Small 2x2 (in/in) Discharge Instruction: Apply Silvercel Small 2x2 (in/in) as instructed Secondary Dressing Zetuvit Plus 4x8 (in/in) Secured With Compression Wrap UNNA BOOT - Unna-Z Zinc and Calamine Boot, 4x10 (in/yd) Discharge Instruction: Unna Paste, Kerlix and Coban from base of toes to three finger widths below bend in knee. Compression Stockings Add-Ons Electronic Signature(s)  Signed: 02/02/2023 5:27:27 PM By: Midge Aver MSN RN CNS WTA Entered By: Midge Aver on 01/27/2023 12:45:40 -------------------------------------------------------------------------------- Wound Assessment Details Patient Name: Date of Service: MUKESH, WUNSCHEL 01/27/2023 12:15 PM Medical Record Number: [id number] Patient Account Number: [id number] Date of Birth/Sex: Treating RN: 1961/03/08 (62 y.o. Chad [name] Primary Care Toure Edmonds: [name] Other Clinician: Referring Lamir Racca: Treating Pattye Meda/Extender: [name] Weeks in Treatment: 59 Wound Status Wound Number: 16 Primary Pressure Ulcer Etiology: Wound Location: Right Calcaneus Wound Open Wounding Event: Gradually Appeared Status: Date Acquired: 09/26/2022 Comorbid Anemia, Chronic Obstructive Pulmonary Disease (COPD), Coronary Weeks Of Treatment: 16 History: Artery Disease, Peripheral Arterial Disease, Peripheral Venous Clustered Wound: Yes Disease, Hepatitis C, Osteoarthritis, Neuropathy, Received HERBERT, TIBBETTS ([id number]) [id number] Page 16 of 21 Chemotherapy Photos Wound Measurements Length: (cm) 2 Width: (cm) 1.2 Depth: (cm) 0.2 Area: (cm) 1.885 Volume: (cm) 0.377 %  Reduction in Area: -100.1% % Reduction in Volume: -100.5% Epithelialization: None Wound Description Classification: Exudate Amount: Exudate Type: Exudate Color: Category/Stage III Medium Serosanguineous red, brown Wound Bed Granulation Amount: Small (1-33%) Granulation Quality: Pink Necrotic Amount: Large (67-100%) Necrotic Quality: Adherent Slough Treatment Notes Wound #16 (Calcaneus) Wound Laterality: Right Cleanser Soap and Water Discharge Instruction: Gently cleanse wound with antibacterial soap, rinse and pat dry prior to dressing wounds Wound Cleanser Discharge Instruction: Wash your hands with soap and water. Remove old dressing, discard into plastic bag and place into trash. Cleanse the wound with Wound Cleanser prior to applying a clean dressing using gauze sponges, not tissues or cotton balls. Do not scrub or use excessive force. Pat dry using gauze sponges, not tissue or cotton balls. Peri-Wound Care Topical Primary Dressing Silvercel Small 2x2 (in/in) Discharge Instruction: Apply Silvercel Small 2x2 (in/in) as instructed Secondary Dressing Zetuvit Plus 4x8 (in/in) Secured With Compression Wrap UNNA BOOT - Unna-Z Zinc and Calamine Boot, 4x10 (in/yd) Discharge Instruction: Unna Paste, Kerlix and Coban from base of toes to three finger widths below bend in knee. Compression Stockings Add-Ons Electronic Signature(s) Signed: 02/02/2023 5:27:27 PM By: Midge Aver MSN RN CNS WTA Entered By: Midge Aver on 01/27/2023 12:46:14 [name] ([id number]) [id number] Page 17 of 21 -------------------------------------------------------------------------------- Wound Assessment Details Patient Name: Date of Service: AAMER, BARRICKLOW 01/27/2023 12:15 PM Medical Record Number: [id number] Patient Account Number: [id number] Date of Birth/Sex: Treating RN: [birth year] (62 y.o. Chad [name] Primary Care Si Jachim: [name] Other  Clinician: Referring Sarahbeth Cashin: Treating Panayiota Larkin/Extender: [name] Weeks in Treatment: 59 Wound Status Wound Number: 17 Primary Pressure Ulcer Etiology: Wound Location: Left, Anterior Lower Leg Wound Open Wounding Event: Pressure Injury Status: Date Acquired: 09/30/2022 Comorbid Anemia, Chronic Obstructive Pulmonary Disease (COPD), Coronary Weeks Of Treatment: 15 History: Artery Disease, Peripheral Arterial Disease, Peripheral Venous Clustered Wound: No Disease, Hepatitis C, Osteoarthritis, Neuropathy, Received Chemotherapy Photos Wound Measurements Length: (cm) 3.5 Width: (cm) 2.5 Depth: (cm) 0.2 Area: (cm) 6.872 Volume: (cm) 1.374 % Reduction in Area: -66.7% % Reduction in Volume: -233.5% Epithelialization: None Wound Description Classification: Category/Stage II Exudate Amount: Medium Exudate Type: Serosanguineous Exudate Color: red, brown Foul Odor After Cleansing: No Slough/Fibrino Yes Wound Bed Granulation Amount: None Present (0%) Necrotic Amount: Large (67-100%) Necrotic Quality: Adherent Slough Treatment Notes Wound #17 (Lower Leg) Wound Laterality: Left, Anterior Cleanser Soap and Water Discharge Instruction: Gently cleanse wound with antibacterial soap, rinse and pat dry prior to dressing wounds Wound Cleanser Discharge Instruction: Wash your hands with soap and water. Remove old dressing, discard into plastic bag and place into trash. Cleanse the  wound with Wound Cleanser prior to applying a clean dressing using gauze sponges, not tissues or cotton balls. Do not scrub or use excessive force. Pat dry using gauze sponges, not tissue or cotton balls. KAIROS, BUNTING ([id number]) [id number] Page 18 of 21 Peri-Wound Care Topical Primary Dressing Silvercel Small 2x2 (in/in) Discharge Instruction: Apply Silvercel Small 2x2 (in/in) as instructed Secondary Dressing Zetuvit Plus 4x8 (in/in) Secured With Compression  Wrap UNNA BOOT - Unna-Z Zinc and Calamine Boot, 4x10 (in/yd) Discharge Instruction: Unna Paste, Kerlix and Coban from base of toes to three finger widths below bend in knee. Compression Stockings Add-Ons Electronic Signature(s) Signed: 02/02/2023 5:27:27 PM By: Midge Aver MSN RN CNS WTA Entered By: Midge Aver on 01/27/2023 12:46:34 -------------------------------------------------------------------------------- Wound Assessment Details Patient Name: Date of Service: DEYVION, DUNDEE 01/27/2023 12:15 PM Medical Record Number: [id number] Patient Account Number: [id number] Date of Birth/Sex: Treating RN: 1961/06/12 (62 y.o. Chad [name] Primary Care Lorian Yaun: [name] Other Clinician: Referring Vinod Mikesell: Treating Latria Mccarron/Extender: [name] Weeks in Treatment: 59 Wound Status Wound Number: 18 Primary Pressure Ulcer Etiology: Wound Location: Right, Medial T Second oe Wound Open Wounding Event: Pressure Injury Status: Date Acquired: 09/26/2022 Comorbid Anemia, Chronic Obstructive Pulmonary Disease (COPD), Coronary Weeks Of Treatment: 15 History: Artery Disease, Peripheral Arterial Disease, Peripheral Venous Clustered Wound: No Disease, Hepatitis C, Osteoarthritis, Neuropathy, Received Chemotherapy Photos Wound Measurements Length: (cm) 1 Width: (cm) 0.5 Depth: (cm) 0.1 Area: (cm) 0.393 Morine, Jacey ([id number]) Volume: (cm) 0.039 % Reduction in Area: 28.5% % Reduction in Volume: 29.1% Epithelialization: None [id number] Page 19 of 21 Wound Description Classification: Category/Stage II Exudate Amount: Medium Exudate Type: Serosanguineous Exudate Color: red, brown Foul Odor After Cleansing: No Slough/Fibrino Yes Wound Bed Granulation Amount: None Present (0%) Necrotic Amount: Large (67-100%) Necrotic Quality: Adherent Slough Treatment Notes Wound #18 (Toe Second) Wound Laterality: Right, Medial Cleanser Soap and  Water Discharge Instruction: Gently cleanse wound with antibacterial soap, rinse and pat dry prior to dressing wounds Wound Cleanser Discharge Instruction: Wash your hands with soap and water. Remove old dressing, discard into plastic bag and place into trash. Cleanse the wound with Wound Cleanser prior to applying a clean dressing using gauze sponges, not tissues or cotton balls. Do not scrub or use excessive force. Pat dry using gauze sponges, not tissue or cotton balls. Peri-Wound Care Topical Primary Dressing Silvercel Small 2x2 (in/in) Discharge Instruction: Apply Silvercel Small 2x2 (in/in) as instructed Secondary Dressing Coverlet Latex-Free Fabric Adhesive Dressings Discharge Instruction: Knuckle Secured With Compression Wrap Compression Stockings Add-Ons Electronic Signature(s) Signed: 02/02/2023 5:27:27 PM By: Midge Aver MSN RN CNS WTA Entered By: Midge Aver on 01/27/2023 12:47:00 -------------------------------------------------------------------------------- Wound Assessment Details Patient Name: Date of Service: NYQUAN, PURSIFULL 01/27/2023 12:15 PM Medical Record Number: [id number] Patient Account Number: [id number] Date of Birth/Sex: Treating RN: 01-16-1961 (62 y.o. Chad [name] Primary Care Neal Oshea: [name] Other Clinician: Referring Deette Revak: Treating Hang Ammon/Extender: [name] Weeks in Treatment: 59 Wound Status Wound Number: 19 Primary Venous Leg Ulcer Etiology: Wound Location: Left, Posterior Lower Leg Wound Open Wounding Event: Gradually GARYSON, BORGMANN ([id number]) [id number] Page 20 of 21 Wounding Event: Gradually Appeared Status: Date Acquired: 11/10/2022 Comorbid Anemia, Chronic Obstructive Pulmonary Disease (COPD), Coronary Weeks Of Treatment: 10 History: Artery Disease, Peripheral Arterial Disease, Peripheral Venous Clustered Wound: Yes Disease, Hepatitis C, Osteoarthritis, Neuropathy,  Received Chemotherapy Photos Wound Measurements Length: (cm) 3 Width: (cm) 1.5 Depth: (cm) 0.1 Area: (cm) 3.534 Volume: (cm) 0.353 % Reduction in Area: -399.9% % Reduction  in Volume: -397.2% Epithelialization: None Wound Description Classification: Full Thickness Without Exposed Support Structures Exudate Amount: Medium Exudate Type: Serous Exudate Color: amber Foul Odor After Cleansing: No Slough/Fibrino Yes Wound Bed Granulation Amount: None Present (0%) Necrotic Amount: Large (67-100%) Necrotic Quality: Adherent Slough Treatment Notes Wound #19 (Lower Leg) Wound Laterality: Left, Posterior Cleanser Soap and Water Discharge Instruction: Gently cleanse wound with antibacterial soap, rinse and pat dry prior to dressing wounds Wound Cleanser Discharge Instruction: Wash your hands with soap and water. Remove old dressing, discard into plastic bag and place into trash. Cleanse the wound with Wound Cleanser prior to applying a clean dressing using gauze sponges, not tissues or cotton balls. Do not scrub or use excessive force. Pat dry using gauze sponges, not tissue or cotton balls. Peri-Wound Care Topical Primary Dressing Silvercel Small 2x2 (in/in) Discharge Instruction: Apply Silvercel Small 2x2 (in/in) as instructed Secondary Dressing Zetuvit Plus 4x8 (in/in) Secured With Compression Wrap UNNA BOOT - Unna-Z Zinc and Calamine Boot, 4x10 (in/yd) Discharge Instruction: Unna Paste, Kerlix and Coban from base of toes to three finger widths below bend in knee. Compression Stockings Add-Ons Electronic Signature(s) Signed: 02/02/2023 5:27:27 PM By: Midge Aver MSN RN CNS WTA Entered By: Midge Aver on 01/27/2023 12:47:21 [name] ([id number]) [id number] Page 21 of 21 -------------------------------------------------------------------------------- Vitals Details Patient Name: Date of Service: ZOEL, BASSANI 01/27/2023 12:15 PM Medical  Record Number: [id number] Patient Account Number: [id number] Date of Birth/Sex: Treating RN: 03-03-1961 (62 y.o. Chad [name] Primary Care Sera Hitsman: [name] Other Clinician: Referring Vonetta Foulk: Treating Ladislao Cohenour/Extender: [name] Weeks in Treatment: 59 Vital Signs Time Taken: 12:24 Temperature (F): 98.1 Height (in): 69 Pulse (bpm): 92 Weight (lbs): 150 Respiratory Rate (breaths/min): 16 Body Mass Index (BMI): 22.1 Blood Pressure (mmHg): 135/83 Reference Range: 80 - 120 mg / dl Electronic Signature(s) Signed: 01/27/2023 4:38:27 PM By: Midge Aver MSN RN CNS WTA Entered By: Midge Aver on 01/27/2023 16:38:27

## 2023-02-03 ENCOUNTER — Encounter: Payer: Medicaid Other | Admitting: Physician Assistant

## 2023-02-03 DIAGNOSIS — I87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 hypertension (idiopathic) with ulcer and inflammation of left lower extremity: Secondary | ICD-10-CM | POA: Diagnosis not present

## 2023-02-03 NOTE — Progress Notes (Addendum)
Chad [name] ([id number]) [id number] Page 1 of 15 Visit Report for 02/03/2023 Chief Complaint Document Details Patient Name: Date of Service: Chad [name], Chad [name] 02/03/2023 3:30 PM Medical Record Number: [id number] Patient Account Number: [id number] Date of Birth/Sex: Treating RN: [birth year] (62 y.o. [name] Primary Care Provider: [name] Other Clinician: Referring Provider: Treating Provider/Extender: [name] Weeks in Treatment: 60 Information Obtained from: Patient Chief Complaint Left ankle ulcer, Left dorsal foot wound, right heel Electronic Signature(s) Signed: 02/03/2023 4:18:53 PM By: [name] PA-C Entered By: [name] on 02/03/2023 16:18:53 -------------------------------------------------------------------------------- HPI Details Patient Name: Date of Service: Chad [name] 02/03/2023 3:30 PM Medical Record Number: [id number] Patient Account Number: [id number] Date of Birth/Sex: Treating RN: [birth year] (62 y.o. [name] Primary Care Provider: [name] Other Clinician: Referring Provider: Treating Provider/Extender: [name] Weeks in Treatment: 60 History of Present Illness HPI Description: 10/08/18 on evaluation today patient actually presents to our office for initial evaluation concerning wounds that he has of the bilateral lower extremities. 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 He also has bilateral venous stasis. He apparently has been homeless currently following discharge from the hospital apparently he has been placed at [facility] which is is a skilled nursing facility locally. Nonetheless fortunately he does not show any signs of infection at this time which is good news. In fact several of the wound actually  appears to be showing some signs of improvement already in my pinion. There are a couple areas in the left leg in particular there likely gonna require some sharp debridement to help clear away some necrotic tissue and help with more sufficient healing. No fevers, chills, nausea, or vomiting noted at this time. 10/15/18 on evaluation today patient actually appears to be doing very well in regard to his bilateral lower extremities. He's been tolerating the dressing changes without complication. Fortunately there does not appear to be any evidence of active infection at this time which is great news. Overall I'm actually very pleased with how this has progressed in just one visits time. Readmission: 08/14/2020 upon evaluation today patient presents for re-evaluation here in our clinic. He is having issues with his left ankle region as well as his right toe and his right heel. He tells me that the toe and heel actually began as a area that was itching that he was scratching and then subsequently opened up into wounds. These may have been abscess areas I presume based on what I am seeing currently. With regard to his left ankle region he tells me this was a similar type DAREAN, ROTE ([id number]) [id number] Page 2 of 15 occurrence although he does have venous stasis this very well may be more of a venous leg ulcer more than anything. Nonetheless I do believe that the patient would benefit from appropriate and aggressive wound care to try to help get things under better control here. He does have history of a stroke on the left side affecting him to some degree there that he is able to stand although he does have some residual weakness. Otherwise again the patient does have chronic venous insufficiency as previously noted. His arterial studies most recently obtained showed that he had an ABI on the right of 1.16 with a TBI of 0.52 and on the left and ABI of 1.14 with a TBI  of  0.81. That was obtained on 06/19/2020. 08/28/2020 upon evaluation today patient appears to be doing decently well in regard to his wounds in general. He has been tolerating the dressing changes without complication. Fortunately there does not appear to be any signs of active infection which is great news. With that being said I think the [facility] is doing a good job I would recommend that we likely continue with that currently. 09/11/2020 upon evaluation today patient's wounds did not appear to be doing too poorly but again he is not really showing signs of significant improvement with regard to any of the wounds on the right. None of them have Hydrofera Blue on them I am not exactly sure why this is not being followed as the facility did not contact us to let us know of any issues with obtaining dressings or otherwise. With that being said he is supposed to be using Hydrofera Blue on both of the wounds on the right foot as well as the ankle wound on the left side. 09/18/2020 upon evaluation today patient appears to be doing poorly with regard to his wounds. Again right now the left ankle in particular showed signs of extreme maceration. Apparently he was told by someone with staff at [facility] they could not get the [facility]. With that being said this is something that is never been relayed to [place] one way or another. Also the patient subsequently has not supposed to have a border gauze dressing on. He should have an ABD pad and roll gauze to secure as this drains much too much just to have a border gauze dressing to cover. Nonetheless the fact that they are not using the appropriate dressing is directly causing deterioration of the left ankle wound it is significantly worse today compared to what it was previous. I did attempt to call [facility] while the patient was here I called three times and got no one to even pick up the phone. After this I had my for an  office coordinator call and she was able to finally get through and leave a message with the D ON as of dictation of this note which is roughly about an hour and a half later I still have not been able to speak with anyone at the facility. 09/25/2020 upon evaluation today patient actually showing signs of good improvement which is excellent news. He has been tolerating the dressing changes without complication. Fortunately there is no signs of active infection which is great news. No fevers, chills, nausea, vomiting, or diarrhea. I do feel like the facility has been doing a much better job at taking care of him as far as the dressings are concerned. However the director of nursing never did call me back. 10/09/2020 upon evaluation today patient appears to be doing well with regard to his wound. The toe ulcer did require some debridement but the other 2 areas actually appear to be doing quite well. 10/19/2020 upon evaluation today patient actually appears to be doing very well in regard to his wounds. In fact the heel does appear to be completely healed. The toe is doing better in the medial ankle on the left is also doing better. Overall I think he is headed in the right direction. 10/26/2020 upon evaluation today patient appears to be doing well with regard to his wound. He is showing signs of improvement which is great news and overall I am very pleased with where things stand today. No fevers, chills,  nausea, vomiting, or diarrhea. 11/02/2020 upon evaluation today patient appears to be doing well with regard to his wounds. He has been tolerating the dressing changes without complication overall I am extremely pleased with where things stand today. He in regard to the toe is almost completely healed and the medial ankle on the left is doing much better. 11/09/2020 upon evaluation today patient appears to be doing a little poorly in regard to his left medial ankle ulcer. Fortunately there does not appear to  be any signs of systemic infection but unfortunately locally he does appear to be infected in fact he has blue-green drainage consistent with Pseudomonas. 11/16/2020 upon evaluation today patient appears to be doing well with regard to his wound. It actually appears to be doing better. I did place him on gentamicin cream since the Cipro was actually resistant even though he was positive for Pseudomonas on culture. Overall I think that he does seem to be doing better though I am unsure whether or not they have actually been putting the cream on. The patient is not sure that we did talk to the nurse directly and she was going to initiate that treatment. Fortunately there does not appear to be any signs of active infection at this time. No fevers, chills, nausea, vomiting, or diarrhea. 4/28; the area on the right second toe is close to healed. Left medial ankle required debridement 12/07/2020 upon evaluation today patient appears to be doing well with regard to his wounds. In fact the right second toe appears to be completely healed which is great news. Fortunately there does not appear to be any signs of active infection at this time which is also great news. I think we can probably discontinue the gentamicin on top of everything else. 12/14/2020 upon evaluation today patient appears to be doing well with regard to his wound. He is making good progress and overall very pleased with where things stand today. There is no signs of active infection at this time which is great news. 12/28/2020 upon evaluation today patient appears to be doing well with regard to his wounds. He has been tolerating the dressing changes without complication. Fortunately there is no signs of active infection at this time. No fevers, chills, nausea, vomiting, or diarrhea. 12/28/2020 upon evaluation today patient's wound bed actually showed signs of excellent improvement. He has great epithelization and granulation I do not see any  signs of infection overall I am extremely pleased with where things stand at this point. No fevers, chills, nausea, vomiting, or diarrhea. 01/11/2021 upon evaluation today patient appears to be doing well with regard to his wound on his leg. He has been tolerating the dressing changes without complication. Fortunately there does not appear to be any signs of active infection which is great news. No fevers, chills, nausea, vomiting, or diarrhea. 01/25/2021 upon evaluation today patient appears to be doing well with regard to his wound. He has been tolerating the dressing changes without complication. Fortunately the collagen seems to be doing a great job which is excellent news. No fevers, chills, nausea, vomiting, or diarrhea. 02/08/2021 upon evaluation today patient's wound is actually looking a little bit worse especially in the periwound compared to previous. Fortunately there does not appear to be any signs of infection which is great news with that being said he does have some irritation around the periphery of the wound which has me more concerned. He actually had a dressing on that had not been changed in 3 days. He  also is supposed to have daily dressing changes. With regard to the dressing applied he had a silver alginate dressing and silver collagen is what is recommended and ordered. He also had no Desitin around the edges of the wound in the periwound region although that is on the order inspect to be done as well. In general I was very concerned I did contact [facility] actually spoke with Verlon Au who is the scheduling individual and subsequently she stated that she would pass the information to the D ON apparently the D ON was not available to talk to me when I call today. 02/18/2021 upon evaluation today patient's wound is actually showing signs of improvement. Fortunately there does not appear to be any evidence of infection which is great news overall I am extremely pleased with  where things stand today. No fevers, chills, nausea, vomiting, or diarrhea. 8/3; patient presents for 1 week follow-up. He has no issues or complaints today. He denies signs of infection. 03/11/2021 upon evaluation today patient appears to be doing well with regard to his wound. He does have a little bit of slough noted on the surface of the wound but fortunately there does not appear to be any signs of active infection at this time. No fevers, chills, nausea, vomiting, or diarrhea. 03/18/2021 upon evaluation today patient appears to be doing well with regard to his wound. He has been tolerating the dressing changes without complication. There was a little irritation more proximal to where the wound was that was not noted last week but nonetheless this is very superficial just seems to be more irritation we just need to make sure to put a good amount of the zinc over the area in my opinion. Otherwise he does not seem to be doing significantly worse at all which is great news. 03/25/2021 upon evaluation today patient appears to be doing well with regard to his wound. He is going require some sharp debridement today to clear with Chad [name], Chad [name] ([id number]) [id number] Page 3 of 15 some of the necrotic debris. I did perform this today without complication postdebridement wound bed appears to be doing much better this is great news. 04/08/2021 upon evaluation today patient appears to be doing decently well in regard to his wound although the overall measurement is not significantly smaller compared to previous. It is gone down a little bit but still the facility continues to not really put the appropriate dressings in place in fact he was supposed to have collagen we think he probably had more of an allergy to At this point. Fortunately there does not appear to be any signs of active infection systemically though locally I do not see anything on initial visualization either as  far as erythema or warmth. 04/15/2021 upon evaluation today patient appears to be doing well with regard to his wound. He is actually showing signs of improvement. I did place him on antibiotics last week, Cipro. He has been taking that 2 times a day and seems to be tolerating it very well. I do not see any evidence of worsening and in fact the overall appearance of the wound is smaller today which is also great news. 9/26; left medial ankle chronic venous insufficiency wound is improved. Using Hydrofera Blue 10/10; left medial ankle chronic venous insufficiency. Wound has not changed much in appearance completely nonviable surface. Apparently there have been problems getting the right product on the wound at the facility although he came in with [facility] on today 05/14/2021 upon  evaluation today patient appears to be doing well with regard to his wound. I think he is making progress here which is good news. Fortunately there does not appear to be any signs of active infection at this time. No fevers, chills, nausea, vomiting, or diarrhea. 05/20/2021 upon evaluation today patient appears to be doing well with regard to his wound. He is showing signs of good improvement which is great news. There does not appear to be any evidence of active infection which is also excellent news. No fevers, chills, nausea, vomiting, or diarrhea. 05/28/2021 upon evaluation today patient appears to be doing quite well. There does not appear to be any signs of active infection at this time which is great news. Overall I am extremely pleased with where things stand today. I think he is headed in the right direction. 06/11/2021 upon evaluation today patient appears to be doing well with regard to his left ankle ulcer and poorly in regard to the toe ulcer on the second toe right foot. This appears to show signs of joint exposure. Apparently this has been present for 1 to 2 months although he kept forgetting to tell me  about it. That is unfortunate as right now it definitely appears to be doing significantly worse than what I would like to see. There does not appear to be any signs of active infection systemically though locally I am concerned about the possibility of infection the toe is quite red. Again no one from the facility ever contacted [place] to advise that this was going on in the interim either. 06/17/2021 upon evaluation today patient presents for follow-up I did review his x-ray which showed a navicular bone fracture I am unsure of the chronicity of this. Subsequently he also had osteomyelitis of the toe which was what I was more concerned about this did not show up on x-ray but did show up on the pathology scrapings. This was listed as acute osteomyelitis. Nonetheless at this point I think that the antibiotic treatment is the best regimen to go with currently. The patient is in agreement with that plan. Nonetheless he has initially 30 days of doxycycline off likely extend that towards the end of the treatment cycle that will be around the middle of December for an additional 2 weeks. That all depends on how well he continues to heal. Nonetheless based on what I am seeing in the foot I did want a proceed with an MRI as well which I think will be helpful to identify if there is anything else that needs to be addressed from the standpoint of infection. 06/24/2021 upon evaluation today patient appears to be doing pretty well in regards to his wounds. I think both are actually showing signs of improvement which is good I did review his MRI today which did show signs of osteomyelitis of the middle and proximal phalanx on his right foot of the affected toe. With that being said this is actually showing signs of significant improvement today already with the antibiotic therapy I think the redness is also improved. Overall I think that we just need to give this some time with appropriate wound care we will see how  things go potentially hyperbarics could be considered. 07/02/2021 upon inspection today patient actually appears to be doing well in regard to his left ankle which is getting very close to complete resolution of pleased in that regard. Unfortunately he is continuing to have issues with his second toe right foot and this seems to still  be very painful for him. Recommend he try something different from the standpoint of antibiotics. 07/15/2021 upon evaluation today patient appears to be doing actually pretty well in regard to his foot. This is actually showing signs of significant improvement which is great news. Overall I feel like the patient is improving both in regard to the second toe as well as the ankle on the left. With that being said the biggest issue that I do see currently is that he is needing to have a refill of the doxycycline that we previously treated him with. He also did see podiatry they are not going to recommend any amputation at this point since he seems to be doing quite well. For that reason we just need to keep things under control from an infection standpoint. 08/01/2021 upon evaluation today patient appears to be doing well with regard to his wound. He has been tolerating the dressing changes without complication. Fortunately there does not appear to be any evidence of active infection locally nor systemically at this point. In fact I think everything is doing excellent in fact his second toe on the right foot is almost healed and the ankle on the left ankle region is actually very close to being healed as well. 08/08/2021 upon evaluation today patient appears to be doing well with regard to his wound. He has been tolerating the dressing changes without complication. Fortunately I do not see any signs of active infection at this time. Readmission: 12-06-2021 upon evaluation today patient presents for reevaluation here in the clinic he does tell me that he was being seen in  facility at [facility] by a provider that was coming in. He is not sure who this was. He tells me however that the wound seems to have gotten worse even compared to where it was when we last saw him at this point. With that being said I do believe that he is likely going need ongoing wound care here in the clinic and I do believe that we need to be the ones to frontline this since his wound does seem to be getting worse not better at this point. He voiced understanding. He is also in agreement with this plan and feels more comfortable coming here she tells me. Patient's medical history really has not changed since his prior admission he was only gone since January. 12-27-2021 upon evaluation today patient appears to be doing well with regard to his wound they did run out of the [facility] so they did not put anything on just an ABD pad with gentamicin. Still we are seeing some signs of good improvement here with some new epithelization which is great news. 01-10-2022 upon evaluation today patient appears to be doing well with regard to his wounds and he is going require some sharp debridement but overall seems to be making good progress. Fortunately I do not see any evidence of active infection locally or systemically at this time which is great news. 01-24-2022 upon evaluation today patient appears to be doing well with regard to his wound. The facility actually came and dropped him off early and he had another appointment at the hospital and then they just brought him over here and this was still hours before his appointment this afternoon. For that reason we did do our best to work him in this morning and fortunately had some space to make this happen. With that being said patient's wound does seem to be making progress here and I am very  pleased in that regard I do not see any signs of active infection locally or systemically at this time. 02-07-2022 patient appears to be doing well  currently in regard to his wounds. In fact one of them the more proximal is healed the distal is still open but seems to be doing excellent. Fortunately I do not see any evidence of active infection locally or systemically at this time which is great news. No fevers, chills, nausea, vomiting, or diarrhea. 7/27; left medial ankle venous. Improving per our intake nurse. We are using Hydrofera Blue under Tubigrip compression. They are changing that at his facility 03-06-2022 upon evaluation today patient appears to be doing well with regard to the his wound he is can require some sharp debridement but seems to be making excellent progress. Fortunately I do not see any evidence of active infection locally or systemically which is great news. 03-27-2022 upon evaluation today patient's wound is actually showing signs of excellent improvement. Fortunately I see no signs of active infection locally or systemically at this time which is great news. No fevers, chills, nausea, vomiting, or diarrhea. Chad [name], Chad [name] ([id number]) [id number] Page 4 of 15 04-21-2022 upon evaluation today patient appears to be doing excellent in regard to his wound in fact this is very close to resolution based on what I am seeing. I do not see any evidence of active infection locally or systemically at this time which is great news and overall I am extremely pleased with where we are today. 05-05-2022 upon evaluation today patient's wound actually appears to potentially be completely healed. Fortunately I do not see any evidence of active infection at this time which is great news and overall I am very pleased I think this needs a little time to toughen up but other than that I really do believe were doing quite well. He is very pleased to hear this its been a long time coming. 05-19-2022 upon evaluation today patient appears to be doing well currently in regard to his wound. He in fact we were hoping will  be completely healed and closed but it still has a very tiny area right in the center which is still continuing to drain. Fortunately I do not see any signs of infection locally or systemically which is great news. 11/6; this wound is close to completely" he comes in this week with some erythema at 1 edge of this which looks like something was rubbing on the area. Other than that no evidence of infection 06-16-2022 upon evaluation today patient appears to be doing well currently in regard to his ankle ulcer. This is very close to being healed but still has a small area in the central portion which is not. Fortunately there does not appear to be any signs of infection locally or systemically which is great news. No fevers, chills, nausea, vomiting, or diarrhea. 06-30-2022 upon evaluation patient's wound pretty much appears to be almost completely closed. In fact it may even be closed but there are still a lot of skin irritation around the edges of the wound and I just do not feel great about releasing them yet I would like to continue to monitor this just a little bit longer before getting to that point. He is not opposed to this and in fact is happy to continue to come in if need be in order to make sure that things are moving in the right direction he definitely does not want to backtrack. 07-14-2022 upon evaluation today patient appears to  be doing well currently in regard to his wound. Has been tolerating the dressing changes without complication. Fortunately I see no evidence of active infection locally nor systemically which is great news. No fevers, chills, nausea, vomiting, or diarrhea. 08-11-2022 upon evaluation today patient appears to be doing a little worse than last time I saw him although it has been a month. Unfortunately he was not able to be seen due to the fact that he was quarantined in the facility with COVID. Subsequently he tells me that he mention to the facility staff several  times about taking it off over the past several weeks but they continue to tell him that someone have to get in touch with [place] here although nobody ever did until Friday when the nurse manager called to have that documented in the communication notes. With that being said unfortunately this means that he had a wrap on that he was supposed to have on for only 1 week for ended up being on four 1 month essentially. I am glad that there is nothing worse than what we see currently to be perfectly honest. 08-19-2022 upon evaluation today patient appears to be doing well currently in regard to his wounds. He has been tolerating the dressing changes without complication. This is actually smaller today which is great news after last week's reopening. Fortunately I do not see any evidence of infection locally nor systemically at this point. 08-26-2022 upon evaluation today patient appears to be doing well currently in regard to his wound. He has been tolerating the dressing changes without complication. Fortunately there does not appear to be any signs of active infection locally nor systemically which is great news. No fevers, chills, nausea, vomiting, or diarrhea. 09-02-2022 upon evaluation today patient's wound actually showing signs of excellent improvement this is measuring smaller and looking much better. I am very pleased with where we stand I do believe that we are headed in the right direction. 09-09-2022 upon evaluation today patient appears to be doing decently well in regard to his leg in general although there was some swelling this is the 1 thing that I am not too happy with based on what I see currently. Fortunately there does not appear to be any signs of infection locally nor systemically at this point. 09-16-2022 upon evaluation today patient appears to be doing well currently in regard to his wound. He is actually showing signs of improvement he wore the Tubigrip this week and it significantly  better compared to last week's evaluation. Fortunately I do not see any evidence of active infection locally or systemically which is great news. Overall I think that were headed in the right direction which is great news. In fact overall I think this is the best that he has looked in some time. He does not like the Tubigrip but actually did extremely well with this in my opinion. I think he needs to continue to utilize this. 09-23-2022 upon evaluation today patient appears to be doing well currently in regard to his wound. He has been tolerating the dressing changes without complication. Fortunately there does not appear to be any signs of active infection locally nor systemically which is great news and in general I do feel like that we are headed in the right direction. He has been having trouble with the Tubigrip however we have gotten the facility to order compression socks he has not gotten them as of yet. 3/4; patient is about the same with a wound on the  left posterior leg. This is a venous wound. He needs more compression on this leg but he took off the previous wraps we are using. I am not certain whether he is using juxta lite stockings at the nursing home. 3/12; patient presents for follow-up. He unfortunately has 2 new wounds 1 to the dorsal left side and the other to the right heel. He is not sure how these happened. The right heel appears to be caused by pressure. He has been using Hydrofera Blue to the original wound on the left leg. 10-14-2022 upon evaluation today patient appears to be doing well currently in regard to his original wound but unfortunately he has several new wounds which I was completely unaware of before walking into the room to see him today. Unfortunately he seems to not be doing nearly as well as what I saw when I last had an appointment with him 3 weeks ago. Fortunately I do not see any signs of systemic infection though unfortunately locally he has definite  infection of the left lower extremity. He is currently on doxycycline I am going to likely have to extend that today. 10-21-2022 upon evaluation today patient appears to be doing well currently in regard to his wounds. He seems to be tolerating the dressing changes without complication. Fortunately there does not appear to be any signs of active infection locally or systemically which is great news and in general I definitely feel like we are on the right track here. 10-30-2022 upon evaluation today patient still continues to not be doing too well at all with regard to his wounds. Unfortunately he is still having signs of infection as well which also has been concerned. Fortunately I do not see any signs of active infection locally nor systemically at this time. No fevers, chills, nausea, vomiting, or diarrhea. 11-06-2022 upon evaluation today patient appears to be doing okay currently in regard to his wounds. All things considered with his blood flow not being nearly as good as what should I think that he is making pretty good progress here. Fortunately I do not see any signs of infection locally nor systemically which is great news. No fevers, chills, nausea, vomiting, or diarrhea. 11-13-2022 upon evaluation today patient appears to be doing poorly currently in regard to his wounds. Things seem to be getting a little bit worse not better. 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 Subsequently he is also going to need to have vascular intervention but we are unsure exactly when this is going on the undertaking. I would try to actually send a message to Dr. Hazle Quant and the vascular team just to see what exactly the plan is going forward. The patient is in agreement with that plan I just think we need to have a plan especially in light of the fact the left leg seems to be getting a little bit worse at this  point. 11-25-2022 upon evaluation today patient is currently now discharged from the hospital following his colostomy procedure. That did very well and he seems to [facility] ([id number]) [id number] Page 5 of 15 be doing well. Fortunately there does not appear to be any signs of active infection locally or systemically which is great news. No fevers, chills, nausea, vomiting, or diarrhea. 12-04-2022 upon evaluation today patient appears to be doing actually decently well in regard to his wounds and actually very pleased with where  we stand and I think that he is making some pretty good progress here. Fortunately I do not see any signs of active infection locally nor systemically which is great news. No fevers, chills, nausea, vomiting, or diarrhea. 12-12-2022 upon evaluation today patient appears to be doing well currently in regard to his wound. He has been tolerating the dressing changes without complication. The wounds all seem to be making some progress to a degree. He has appointment with vascular on the 23rd of this month. 5/24; the patient was scheduled for a left lower extremity angiogram yesterday but apparently they were overbooked and they wanted to admit him to hospital last night so they could do this early this morning but he refused I see that Dr. Wyn Quaker has rescheduled him for June 6. He is using silver alginate to wounds on his bilateral lower extremities. 01-02-2023 upon evaluation today patient appears to be doing poorly in regard to his wounds still. Subsequently he did have his angiogram but apparently there really was not much that was found that could be revascularized. The patient tells me that Dr. Wyn Quaker was going to contact me here in the office I have not heard from him as of yet and I may see about giving him a call just to follow-up or possibly send a message through epic which could be an option as well. 01-09-2023 upon evaluation today patient  appears to be doing poorly in regard to his legs he is extremely swollen. He has been sleeping in his chair he tells me for the past week. His bed is broken and they have ordered a new one which is supposedly supposed to be there today. With that being said he the way he needs to be not sleeping in his chair that is his wheelchair every day and through the night. This is causing his legs to be much more swollen and it seen and how badly wounds are doing at this point. This has made things significantly worse. Fortunately I do not see any signs of infection right now unfortunately I do believe that the patient has a tremendous amount of drainage currently. 01-19-2023 upon evaluation today patient appears to be doing poorly in regard to his wounds of his legs his swelling is better but still not as good as what we would like to see. Fortunately I do not see any evidence of active infection locally nor systemically which is great news and in general I do believe that we are moving in the right direction here. Nonetheless it would be better if she was in the bed sleeping or not send up in his wheelchair although he seems to still be set up in the wheelchair at this time. He tells me that he is just not as comfortable in the bed as the wheelchair but unfortunately this is causing damage to his legs which I discussed with him today as well. Nonetheless I am not sure that he is going to be amendable to doing anything different. 01-27-2023 upon evaluation today patient appears to be doing well currently. Fortunately I do not see any evidence of active infection locally nor systemically which is great news I think he is actually doing the best option and while in fact I would try to see if we can do some debridement today to get things moving along. 02-03-2023 upon evaluation today patient's wounds unfortunately are not doing nearly as well. Will be doing the [employer] wraps unfortunately we just not seeing the  improvement  that I would like to see. Fortunately I do not see any evidence of infection at this time which is good news unfortunately I do think that he needs to be changed on a daily basis due to the amount of drainage otherwise I do not think this is really going to continue to improve dramatically. Electronic Signature(s) Signed: 02/03/2023 5:45:08 PM By: [name] PA-C Entered By: [name] on 02/03/2023 17:45:08 -------------------------------------------------------------------------------- Physical Exam Details Patient Name: Date of Service: Chad [name], Chad [name] 02/03/2023 3:30 PM Medical Record Number: [id number] Patient Account Number: [id number] Date of Birth/Sex: Treating RN: 10-Mar-1961 (62 y.o. [name] Primary Care Provider: [name] Other Clinician: Referring Provider: Treating Provider/Extender: [name] Weeks in Treatment: 60 Constitutional Well-nourished and well-hydrated in no acute distress. Respiratory normal breathing without difficulty. Psychiatric this patient is able to make decisions and demonstrates good insight into disease process. Alert and Oriented x 3. pleasant and cooperative. Notes Upon inspection patient's wound bed actually showed signs at all locations specially the left leg for showing signs of a lot of drainage and again I think a big portion of this is noncompliance he sits up in his chair and again this evening with Unna boot wraps is still causing a tremendous amount of drainage. I discussed with him today again that he needs to get in his bed but again it does not sound like that he is going to do it. He tells me that his left knee hurts too bad that is the reason that he is not getting the bed but again there is really nothing I can do to improve his knee I understand this is painful but is something that he is probably going to have to continue to live with it to some degree as I do not think anybody is going to do a knee  replacement and he is already becoming contracted if he continues to have these wounds. Chad [name], Chad [name] ([id number]) [id number] Page 6 of 15 Electronic Signature(s) Signed: 02/03/2023 5:46:02 PM By: [name] PA-C Entered By: [name] on 02/03/2023 17:46:02 -------------------------------------------------------------------------------- Physician Orders Details Patient Name: Date of Service: Chad [name], Chad [name] 02/03/2023 3:30 PM Medical Record Number: [id number] Patient Account Number: [id number] Date of Birth/Sex: Treating RN: [birth year] (63 y.o. 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 Nurse Visit as needed Bathing/ Shower/ Hygiene May shower with wound dressing protected with water repellent cover or cast protector. No tub bath. Anesthetic (Use 'Patient Medications' Section for Anesthetic Order Entry) Lidocaine applied to wound bed Edema Control - Lymphedema / Segmental Compressive Device / Other Elevate legs to the level of the heart and pump ankles as often as possible Elevate leg(s) parallel to the floor when sitting. Other: - PATIENT MUST SLEEP IN A BED. Legs are swelling due to no elevation of legs. Off-Loading Heel suspension boot - when in bed Medications-Please add to medication list. Other: - apply  AandD ointment to leg, do not apply to wound Wound Treatment Wound #  12 - Ankle Wound Laterality: Left, Medial, Distal Cleanser: Soap and Water 1 x Per Day/30 Days Discharge Instructions: Gently cleanse wound with antibacterial soap, rinse and pat dry prior to dressing wounds Cleanser: Wound Cleanser 1 x Per Day/30 Days Discharge Instructions: Wash your hands with soap and water. Remove old dressing, discard into plastic bag and place into trash. Cleanse the wound with Wound Cleanser prior to applying a clean dressing using gauze sponges, not tissues or cotton balls. Do not scrub or use excessive force. Pat dry using gauze sponges, not tissue or cotton balls. Prim Dressing: Silvercel Small 2x2 (in/in) 1 x Per Day/30 Days ary Discharge Instructions: Apply Silvercel Small 2x2 (in/in) as instructed Secondary Dressing: ABD Pad 5x9 (in/in) 1 x Per Day/30 Days Discharge Instructions: Cover with ABD pad Chad [name], Chad [name] ([id number]) [id number] Page 7 of 15 Secured With: ACE WRAP - 96M ACE Elastic Bandage With VELCRO Brand Closure, 4 (in) 1 x Per Day/30 Days Wound #15 - Foot Wound Laterality: Dorsal, Left Cleanser: Soap and Water 1 x Per Day/30 Days Discharge Instructions: Gently cleanse wound with antibacterial soap, rinse and pat dry prior to dressing wounds Cleanser: Wound Cleanser 1 x Per Day/30 Days Discharge Instructions: Wash your hands with soap and water. Remove old dressing, discard into plastic bag and place into trash. Cleanse the wound with Wound Cleanser prior to applying a clean dressing using gauze sponges, not tissues or cotton balls. Do not scrub or use excessive force. Pat dry using gauze sponges, not tissue or cotton balls. Prim Dressing: Silvercel Small 2x2 (in/in) 1 x Per Day/30 Days ary Discharge Instructions: Apply Silvercel Small 2x2 (in/in) as instructed Secondary Dressing: ABD Pad 5x9 (in/in) 1 x Per Day/30 Days Discharge Instructions: Cover  with ABD pad Secured With: ACE WRAP - 96M ACE Elastic Bandage With VELCRO Brand Closure, 4 (in) 1 x Per Day/30 Days Wound #16 - Calcaneus Wound Laterality: Right Cleanser: Soap and Water 1 x Per Day/30 Days Discharge Instructions: Gently cleanse wound with antibacterial soap, rinse and pat dry prior to dressing wounds Cleanser: Wound Cleanser 1 x Per Day/30 Days Discharge Instructions: Wash your hands with soap and water. Remove old dressing, discard into plastic bag and place into trash. Cleanse the wound with Wound Cleanser prior to applying a clean dressing using gauze sponges, not tissues or cotton balls. Do not scrub or use excessive force. Pat dry using gauze sponges, not tissue or cotton balls. Prim Dressing: Silvercel Small 2x2 (in/in) 1 x Per Day/30 Days ary Discharge Instructions: Apply Silvercel Small 2x2 (in/in) as instructed Secondary Dressing: ABD Pad 5x9 (in/in) 1 x Per Day/30 Days Discharge Instructions: Cover with ABD pad Secured With: ACE WRAP - 96M ACE Elastic Bandage With VELCRO Brand Closure, 4 (in) 1 x Per Day/30 Days Wound #17 - Lower Leg Wound Laterality: Left, Anterior Cleanser: Soap and Water 1 x Per Day/30 Days Discharge Instructions: Gently cleanse wound with antibacterial soap, rinse and pat dry prior to dressing wounds Cleanser: Wound Cleanser 1 x Per Day/30 Days Discharge Instructions: Wash your hands with soap and water. Remove old dressing, discard into plastic bag and place into trash. Cleanse the wound with Wound Cleanser prior to applying a clean dressing using gauze sponges, not tissues or cotton balls. Do not scrub or use excessive force. Pat dry using gauze sponges, not tissue or cotton balls. Prim Dressing: Silvercel Small 2x2 (in/in) 1 x Per Day/30 Days ary Discharge Instructions: Apply Silvercel Small 2x2 (in/in) as instructed Secondary Dressing: ABD Pad  5x9 (in/in) 1 x Per Day/30 Days Discharge Instructions: Cover with ABD pad Secured With: ACE WRAP  - 32M ACE Elastic Bandage With VELCRO Brand Closure, 4 (in) 1 x Per Day/30 Days Wound #18 - T Second oe Wound Laterality: Right, Medial Cleanser: Soap and Water 1 x Per Day/30 Days Discharge Instructions: Gently cleanse wound with antibacterial soap, rinse and pat dry prior to dressing wounds Cleanser: Wound Cleanser 1 x Per Day/30 Days Discharge Instructions: Wash your hands with soap and water. Remove old dressing, discard into plastic bag and place into trash. Cleanse the wound with Wound Cleanser prior to applying a clean dressing using gauze sponges, not tissues or cotton balls. Do not scrub or use excessive force. Pat dry using gauze sponges, not tissue or cotton balls. Prim Dressing: Silvercel Small 2x2 (in/in) 1 x Per Day/30 Days ary Discharge Instructions: Apply Silvercel Small 2x2 (in/in) as instructed Secondary Dressing: Coverlet Latex-Free Fabric Adhesive Dressings 1 x Per Day/30 Days Discharge Instructions: Knuckle Wound #19 - Lower Leg Wound Laterality: Left, Posterior Cleanser: Soap and Water 1 x Per Day/30 Days Discharge Instructions: Gently cleanse wound with antibacterial soap, rinse and pat dry prior to dressing wounds Cleanser: Wound Cleanser 1 x Per Day/30 Days Discharge Instructions: Wash your hands with soap and water. Remove old dressing, discard into plastic bag and place into trash. Cleanse the wound with Wound Cleanser prior to applying a clean dressing using gauze sponges, not tissues or cotton balls. Do not scrub or use excessive force. Pat dry using gauze sponges, not tissue or cotton balls. Chad [name], Chad [name] ([id number]) [id number] Page 8 of 15 Prim Dressing: Silvercel Small 2x2 (in/in) 1 x Per Day/30 Days ary Discharge Instructions: Apply Silvercel Small 2x2 (in/in) as instructed Secondary Dressing: ABD Pad 5x9 (in/in) 1 x Per Day/30 Days Discharge Instructions: Cover with ABD pad Secured With: ACE WRAP - 32M ACE Elastic Bandage With  VELCRO Brand Closure, 4 (in) 1 x Per Day/30 Days Electronic Signature(s) Signed: 02/05/2023 5:16:05 PM By: Midge Aver MSN RN CNS WTA Signed: 02/05/2023 5:17:09 PM By: [name] PA-C Previous Signature: 02/03/2023 7:04:22 PM Version By: [name] PA-C Entered By: Midge Aver on 02/04/2023 12:03:57 -------------------------------------------------------------------------------- Problem List Details Patient Name: Date of Service: Chad [name], Chad [name] 02/03/2023 3:30 PM Medical Record Number: [id number] Patient Account Number: [id number] Date of Birth/Sex: Treating RN: August 19, 1960 (62 y.o. 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 affecting left non- 12/06/2021 No Yes dominant side 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, stage 3 10/07/2022 No Yes Inactive Problems Resolved Problems Electronic Signature(s) Signed: 02/03/2023 4:18:40 PM By: Darlyn Chamber, [name] ([id number]) [id number] Page 9 of 15 Entered By: [name] on 02/03/2023 16:18:40 -------------------------------------------------------------------------------- Progress Note Details Patient Name: Date of Service: Chad [name], Chad [name] 02/03/2023 3:30 PM Medical Record Number: [id number] Patient Account Number: [id number] Date of Birth/Sex: Treating RN: Jun 07, 1961 (62 y.o. [name] Primary Care Provider: [name] Other  Clinician: Referring Provider: Treating Provider/Extender: [name] Weeks in Treatment: 60 Subjective Chief Complaint Information obtained from Patient Left  ankle ulcer, Left dorsal foot wound, right heel History of Present Illness (HPI) 10/08/18 on evaluation today patient actually presents to our office for initial evaluation concerning wounds that he has of the bilateral lower extremities. 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 He also has bilateral venous stasis. He apparently has been homeless currently following discharge from the hospital apparently he has been placed at [facility] which is is a skilled nursing facility locally. Nonetheless fortunately he does not show any signs of infection at this time which is good news. In fact several of the wound actually appears to be showing some signs of improvement already in my pinion. There are a couple areas in the left leg in particular there likely gonna require some sharp debridement to help clear away some necrotic tissue and help with more sufficient healing. No fevers, chills, nausea, or vomiting noted at this time. 10/15/18 on evaluation today patient actually appears to be doing very well in regard to his bilateral lower extremities. He's been tolerating the dressing changes without complication. Fortunately there does not appear to be any evidence of active infection at this time which is great news. Overall I'm actually very pleased with how this has progressed in just one visits time. Readmission: 08/14/2020 upon evaluation today patient presents for re-evaluation here in our clinic. He is having issues with his left ankle region as well as his right toe and his right heel. He tells me that the toe and heel actually began as a area that was itching  that he was scratching and then subsequently opened up into wounds. These may have been abscess areas I presume based on what I am seeing currently. With regard to his left ankle region he tells me this was a similar type occurrence although he does have venous stasis this very well may be more of a venous leg ulcer more than anything. Nonetheless I do believe that the patient would benefit from appropriate and aggressive wound care to try to help get things under better control here. He does have history of a stroke on the left side affecting him to some degree there that he is able to stand although he does have some residual weakness. Otherwise again the patient does have chronic venous insufficiency as previously noted. His arterial studies most recently obtained showed that he had an ABI on the right of 1.16 with a TBI of 0.52 and on the left and ABI of 1.14 with a TBI of 0.81. That was obtained on 06/19/2020. 08/28/2020 upon evaluation today patient appears to be doing decently well in regard to his wounds in general. He has been tolerating the dressing changes without complication. Fortunately there does not appear to be any signs of active infection which is great news. With that being said I think the [facility] is doing a good job I would recommend that we likely continue with that currently. 09/11/2020 upon evaluation today patient's wounds did not appear to be doing too poorly but again he is not really showing signs of significant improvement with regard to any of the wounds on the right. None of them have Hydrofera Blue on them I am not exactly sure why this is not being followed as the facility did not contact us to let us know of any issues with obtaining dressings or otherwise. With that  being said he is supposed to be using Hydrofera Blue on both of the wounds on the right foot as well as the ankle wound on the left side. 09/18/2020 upon evaluation today patient appears to be doing  poorly with regard to his wounds. Again right now the left ankle in particular showed signs of extreme maceration. Apparently he was told by someone with staff at [facility] they could not get the [facility]. With that being said this is something that is never been relayed to [place] one way or another. Also the patient subsequently has not supposed to have a border gauze dressing on. He should have an ABD pad and roll gauze to secure as this drains much too much just to have a border gauze dressing to cover. Nonetheless the fact that they are not using the appropriate dressing is directly causing deterioration of the left ankle wound it is significantly worse today compared to what it was previous. I did attempt to call [facility] while the patient was here I called three times and got no one to even pick up the phone. After this I had my for an office coordinator call and she was able to finally get through and leave a message with the D ON as of dictation of this note which is roughly about an hour and a half later I still have not been able to speak with anyone at the facility. 09/25/2020 upon evaluation today patient actually showing signs of good improvement which is excellent news. He has been tolerating the dressing changes without complication. Fortunately there is no signs of active infection which is great news. No fevers, chills, nausea, vomiting, or diarrhea. I do feel like the facility has been doing a much better job at taking care of him as far as the dressings are concerned. However the director of nursing never did call me back. 10/09/2020 upon evaluation today patient appears to be doing well with regard to his wound. The toe ulcer did require some debridement but the other 2 areas actually appear to be doing quite well. 10/19/2020 upon evaluation today patient actually appears to be doing very well in regard to his wounds. In fact the heel does appear to be completely  healed. The toe is doing better in the medial ankle on the left is also doing better. Overall I think he is headed in the right direction. 10/26/2020 upon evaluation today patient appears to be doing well with regard to his wound. He is showing signs of improvement which is great news and overall I am very pleased with where things stand today. No fevers, chills, nausea, vomiting, or diarrhea. Chad [name], Chad [name] ([id number]) [id number] Page 10 of 15 11/02/2020 upon evaluation today patient appears to be doing well with regard to his wounds. He has been tolerating the dressing changes without complication overall I am extremely pleased with where things stand today. He in regard to the toe is almost completely healed and the medial ankle on the left is doing much better. 11/09/2020 upon evaluation today patient appears to be doing a little poorly in regard to his left medial ankle ulcer. Fortunately there does not appear to be any signs of systemic infection but unfortunately locally he does appear to be infected in fact he has blue-green drainage consistent with Pseudomonas. 11/16/2020 upon evaluation today patient appears to be doing well with regard to his wound. It actually appears to be doing better. I did place him on gentamicin cream since the Cipro  was actually resistant even though he was positive for Pseudomonas on culture. Overall I think that he does seem to be doing better though I am unsure whether or not they have actually been putting the cream on. The patient is not sure that we did talk to the nurse directly and she was going to initiate that treatment. Fortunately there does not appear to be any signs of active infection at this time. No fevers, chills, nausea, vomiting, or diarrhea. 4/28; the area on the right second toe is close to healed. Left medial ankle required debridement 12/07/2020 upon evaluation today patient appears to be doing well with regard to his  wounds. In fact the right second toe appears to be completely healed which is great news. Fortunately there does not appear to be any signs of active infection at this time which is also great news. I think we can probably discontinue the gentamicin on top of everything else. 12/14/2020 upon evaluation today patient appears to be doing well with regard to his wound. He is making good progress and overall very pleased with where things stand today. There is no signs of active infection at this time which is great news. 12/28/2020 upon evaluation today patient appears to be doing well with regard to his wounds. He has been tolerating the dressing changes without complication. Fortunately there is no signs of active infection at this time. No fevers, chills, nausea, vomiting, or diarrhea. 12/28/2020 upon evaluation today patient's wound bed actually showed signs of excellent improvement. He has great epithelization and granulation I do not see any signs of infection overall I am extremely pleased with where things stand at this point. No fevers, chills, nausea, vomiting, or diarrhea. 01/11/2021 upon evaluation today patient appears to be doing well with regard to his wound on his leg. He has been tolerating the dressing changes without complication. Fortunately there does not appear to be any signs of active infection which is great news. No fevers, chills, nausea, vomiting, or diarrhea. 01/25/2021 upon evaluation today patient appears to be doing well with regard to his wound. He has been tolerating the dressing changes without complication. Fortunately the collagen seems to be doing a great job which is excellent news. No fevers, chills, nausea, vomiting, or diarrhea. 02/08/2021 upon evaluation today patient's wound is actually looking a little bit worse especially in the periwound compared to previous. Fortunately there does not appear to be any signs of infection which is great news with that being said he  does have some irritation around the periphery of the wound which has me more concerned. He actually had a dressing on that had not been changed in 3 days. He also is supposed to have daily dressing changes. With regard to the dressing applied he had a silver alginate dressing and silver collagen is what is recommended and ordered. He also had no Desitin around the edges of the wound in the periwound region although that is on the order inspect to be done as well. In general I was very concerned I did contact [facility] actually spoke with Verlon Au who is the scheduling individual and subsequently she stated that she would pass the information to the D ON apparently the D ON was not available to talk to me when I call today. 02/18/2021 upon evaluation today patient's wound is actually showing signs of improvement. Fortunately there does not appear to be any evidence of infection which is great news overall I am extremely pleased with where things stand  today. No fevers, chills, nausea, vomiting, or diarrhea. 8/3; patient presents for 1 week follow-up. He has no issues or complaints today. He denies signs of infection. 03/11/2021 upon evaluation today patient appears to be doing well with regard to his wound. He does have a little bit of slough noted on the surface of the wound but fortunately there does not appear to be any signs of active infection at this time. No fevers, chills, nausea, vomiting, or diarrhea. 03/18/2021 upon evaluation today patient appears to be doing well with regard to his wound. He has been tolerating the dressing changes without complication. There was a little irritation more proximal to where the wound was that was not noted last week but nonetheless this is very superficial just seems to be more irritation we just need to make sure to put a good amount of the zinc over the area in my opinion. Otherwise he does not seem to be doing significantly worse at all which is  great news. 03/25/2021 upon evaluation today patient appears to be doing well with regard to his wound. He is going require some sharp debridement today to clear with some of the necrotic debris. I did perform this today without complication postdebridement wound bed appears to be doing much better this is great news. 04/08/2021 upon evaluation today patient appears to be doing decently well in regard to his wound although the overall measurement is not significantly smaller compared to previous. It is gone down a little bit but still the facility continues to not really put the appropriate dressings in place in fact he was supposed to have collagen we think he probably had more of an allergy to At this point. Fortunately there does not appear to be any signs of active infection systemically though locally I do not see anything on initial visualization either as far as erythema or warmth. 04/15/2021 upon evaluation today patient appears to be doing well with regard to his wound. He is actually showing signs of improvement. I did place him on antibiotics last week, Cipro. He has been taking that 2 times a day and seems to be tolerating it very well. I do not see any evidence of worsening and in fact the overall appearance of the wound is smaller today which is also great news. 9/26; left medial ankle chronic venous insufficiency wound is improved. Using Hydrofera Blue 10/10; left medial ankle chronic venous insufficiency. Wound has not changed much in appearance completely nonviable surface. Apparently there have been problems getting the right product on the wound at the facility although he came in with [facility] on today 05/14/2021 upon evaluation today patient appears to be doing well with regard to his wound. I think he is making progress here which is good news. Fortunately there does not appear to be any signs of active infection at this time. No fevers, chills, nausea, vomiting, or  diarrhea. 05/20/2021 upon evaluation today patient appears to be doing well with regard to his wound. He is showing signs of good improvement which is great news. There does not appear to be any evidence of active infection which is also excellent news. No fevers, chills, nausea, vomiting, or diarrhea. 05/28/2021 upon evaluation today patient appears to be doing quite well. There does not appear to be any signs of active infection at this time which is great news. Overall I am extremely pleased with where things stand today. I think he is headed in the right direction. 06/11/2021 upon evaluation today patient appears  to be doing well with regard to his left ankle ulcer and poorly in regard to the toe ulcer on the second toe right foot. This appears to show signs of joint exposure. Apparently this has been present for 1 to 2 months although he kept forgetting to tell me about it. That is unfortunate as right now it definitely appears to be doing significantly worse than what I would like to see. There does not appear to be any signs of active infection systemically though locally I am concerned about the possibility of infection the toe is quite red. Again no one from the facility ever contacted [place] to advise that this was going on in the interim either. 06/17/2021 upon evaluation today patient presents for follow-up I did review his x-ray which showed a navicular bone fracture I am unsure of the chronicity of this. Subsequently he also had osteomyelitis of the toe which was what I was more concerned about this did not show up on x-ray but did show up on the pathology scrapings. This was listed as acute osteomyelitis. Nonetheless at this point I think that the antibiotic treatment is the best regimen to go with currently. The patient is in agreement with that plan. Nonetheless he has initially 30 days of doxycycline off likely extend that towards the end of the treatment cycle that will be around the  middle of December for an additional 2 weeks. That all depends on how well he continues to heal. Nonetheless based on what I am seeing in the foot I did want a proceed with an MRI as well which I think will be helpful to identify if there is anything else that needs to be addressed from the standpoint of infection. Chad [name], PELLA ([id number]) [id number] Page 11 of 15 06/24/2021 upon evaluation today patient appears to be doing pretty well in regards to his wounds. I think both are actually showing signs of improvement which is good I did review his MRI today which did show signs of osteomyelitis of the middle and proximal phalanx on his right foot of the affected toe. With that being said this is actually showing signs of significant improvement today already with the antibiotic therapy I think the redness is also improved. Overall I think that we just need to give this some time with appropriate wound care we will see how things go potentially hyperbarics could be considered. 07/02/2021 upon inspection today patient actually appears to be doing well in regard to his left ankle which is getting very close to complete resolution of pleased in that regard. Unfortunately he is continuing to have issues with his second toe right foot and this seems to still be very painful for him. Recommend he try something different from the standpoint of antibiotics. 07/15/2021 upon evaluation today patient appears to be doing actually pretty well in regard to his foot. This is actually showing signs of significant improvement which is great news. Overall I feel like the patient is improving both in regard to the second toe as well as the ankle on the left. With that being said the biggest issue that I do see currently is that he is needing to have a refill of the doxycycline that we previously treated him with. He also did see podiatry they are not going to recommend any amputation at this  point since he seems to be doing quite well. For that reason we just need to keep things under control from an infection standpoint. 08/01/2021 upon evaluation today patient  appears to be doing well with regard to his wound. He has been tolerating the dressing changes without complication. Fortunately there does not appear to be any evidence of active infection locally nor systemically at this point. In fact I think everything is doing excellent in fact his second toe on the right foot is almost healed and the ankle on the left ankle region is actually very close to being healed as well. 08/08/2021 upon evaluation today patient appears to be doing well with regard to his wound. He has been tolerating the dressing changes without complication. Fortunately I do not see any signs of active infection at this time. Readmission: 12-06-2021 upon evaluation today patient presents for reevaluation here in the clinic he does tell me that he was being seen in facility at [facility] by a provider that was coming in. He is not sure who this was. He tells me however that the wound seems to have gotten worse even compared to where it was when we last saw him at this point. With that being said I do believe that he is likely going need ongoing wound care here in the clinic and I do believe that we need to be the ones to frontline this since his wound does seem to be getting worse not better at this point. He voiced understanding. He is also in agreement with this plan and feels more comfortable coming here she tells me. Patient's medical history really has not changed since his prior admission he was only gone since January. 12-27-2021 upon evaluation today patient appears to be doing well with regard to his wound they did run out of the [facility] so they did not put anything on just an ABD pad with gentamicin. Still we are seeing some signs of good improvement here with some new epithelization which is great  news. 01-10-2022 upon evaluation today patient appears to be doing well with regard to his wounds and he is going require some sharp debridement but overall seems to be making good progress. Fortunately I do not see any evidence of active infection locally or systemically at this time which is great news. 01-24-2022 upon evaluation today patient appears to be doing well with regard to his wound. The facility actually came and dropped him off early and he had another appointment at the hospital and then they just brought him over here and this was still hours before his appointment this afternoon. For that reason we did do our best to work him in this morning and fortunately had some space to make this happen. With that being said patient's wound does seem to be making progress here and I am very pleased in that regard I do not see any signs of active infection locally or systemically at this time. 02-07-2022 patient appears to be doing well currently in regard to his wounds. In fact one of them the more proximal is healed the distal is still open but seems to be doing excellent. Fortunately I do not see any evidence of active infection locally or systemically at this time which is great news. No fevers, chills, nausea, vomiting, or diarrhea. 7/27; left medial ankle venous. Improving per our intake nurse. We are using Hydrofera Blue under Tubigrip compression. They are changing that at his facility 03-06-2022 upon evaluation today patient appears to be doing well with regard to the his wound he is can require some sharp debridement but seems to be making excellent progress. Fortunately I do not see any  evidence of active infection locally or systemically which is great news. 03-27-2022 upon evaluation today patient's wound is actually showing signs of excellent improvement. Fortunately I see no signs of active infection locally or systemically at this time which is great news. No fevers, chills, nausea,  vomiting, or diarrhea. 04-21-2022 upon evaluation today patient appears to be doing excellent in regard to his wound in fact this is very close to resolution based on what I am seeing. I do not see any evidence of active infection locally or systemically at this time which is great news and overall I am extremely pleased with where we are today. 05-05-2022 upon evaluation today patient's wound actually appears to potentially be completely healed. Fortunately I do not see any evidence of active infection at this time which is great news and overall I am very pleased I think this needs a little time to toughen up but other than that I really do believe were doing quite well. He is very pleased to hear this its been a long time coming. 05-19-2022 upon evaluation today patient appears to be doing well currently in regard to his wound. He in fact we were hoping will be completely healed and closed but it still has a very tiny area right in the center which is still continuing to drain. Fortunately I do not see any signs of infection locally or systemically which is great news. 11/6; this wound is close to completely" he comes in this week with some erythema at 1 edge of this which looks like something was rubbing on the area. Other than that no evidence of infection 06-16-2022 upon evaluation today patient appears to be doing well currently in regard to his ankle ulcer. This is very close to being healed but still has a small area in the central portion which is not. Fortunately there does not appear to be any signs of infection locally or systemically which is great news. No fevers, chills, nausea, vomiting, or diarrhea. 06-30-2022 upon evaluation patient's wound pretty much appears to be almost completely closed. In fact it may even be closed but there are still a lot of skin irritation around the edges of the wound and I just do not feel great about releasing them yet I would like to continue to monitor  this just a little bit longer before getting to that point. He is not opposed to this and in fact is happy to continue to come in if need be in order to make sure that things are moving in the right direction he definitely does not want to backtrack. 07-14-2022 upon evaluation today patient appears to be doing well currently in regard to his wound. Has been tolerating the dressing changes without complication. Fortunately I see no evidence of active infection locally nor systemically which is great news. No fevers, chills, nausea, vomiting, or diarrhea. 08-11-2022 upon evaluation today patient appears to be doing a little worse than last time I saw him although it has been a month. Unfortunately he was not able to be seen due to the fact that he was quarantined in the facility with COVID. Subsequently he tells me that he mention to the facility staff several times about taking it off over the past several weeks but they continue to tell him that someone have to get in touch with [place] here although nobody ever did until Friday when the nurse manager called to have that documented in the communication notes. With that being said unfortunately this means that  he had a wrap on that he was supposed to have on for only 1 week for ended up being on four 1 month essentially. I am glad that there is nothing worse than what we see currently to be perfectly honest. 08-19-2022 upon evaluation today patient appears to be doing well currently in regard to his wounds. He has been tolerating the dressing changes without complication. This is actually smaller today which is great news after last week's reopening. Fortunately I do not see any evidence of infection locally nor Galster, Reginal ([id number]) [id number] Page 12 of 15 systemically at this point. 08-26-2022 upon evaluation today patient appears to be doing well currently in regard to his wound. He has been tolerating the dressing  changes without complication. Fortunately there does not appear to be any signs of active infection locally nor systemically which is great news. No fevers, chills, nausea, vomiting, or diarrhea. 09-02-2022 upon evaluation today patient's wound actually showing signs of excellent improvement this is measuring smaller and looking much better. I am very pleased with where we stand I do believe that we are headed in the right direction. 09-09-2022 upon evaluation today patient appears to be doing decently well in regard to his leg in general although there was some swelling this is the 1 thing that I am not too happy with based on what I see currently. Fortunately there does not appear to be any signs of infection locally nor systemically at this point. 09-16-2022 upon evaluation today patient appears to be doing well currently in regard to his wound. He is actually showing signs of improvement he wore the Tubigrip this week and it significantly better compared to last week's evaluation. Fortunately I do not see any evidence of active infection locally or systemically which is great news. Overall I think that were headed in the right direction which is great news. In fact overall I think this is the best that he has looked in some time. He does not like the Tubigrip but actually did extremely well with this in my opinion. I think he needs to continue to utilize this. 09-23-2022 upon evaluation today patient appears to be doing well currently in regard to his wound. He has been tolerating the dressing changes without complication. Fortunately there does not appear to be any signs of active infection locally nor systemically which is great news and in general I do feel like that we are headed in the right direction. He has been having trouble with the Tubigrip however we have gotten the facility to order compression socks he has not gotten them as of yet. 3/4; patient is about the same with a wound on the left  posterior leg. This is a venous wound. He needs more compression on this leg but he took off the previous wraps we are using. I am not certain whether he is using juxta lite stockings at the nursing home. 3/12; patient presents for follow-up. He unfortunately has 2 new wounds 1 to the dorsal left side and the other to the right heel. He is not sure how these happened. The right heel appears to be caused by pressure. He has been using Hydrofera Blue to the original wound on the left leg. 10-14-2022 upon evaluation today patient appears to be doing well currently in regard to his original wound but unfortunately he has several new wounds which I was completely unaware of before walking into the room to see him today. Unfortunately he seems to not be doing nearly  as well as what I saw when I last had an appointment with him 3 weeks ago. Fortunately I do not see any signs of systemic infection though unfortunately locally he has definite infection of the left lower extremity. He is currently on doxycycline I am going to likely have to extend that today. 10-21-2022 upon evaluation today patient appears to be doing well currently in regard to his wounds. He seems to be tolerating the dressing changes without complication. Fortunately there does not appear to be any signs of active infection locally or systemically which is great news and in general I definitely feel like we are on the right track here. 10-30-2022 upon evaluation today patient still continues to not be doing too well at all with regard to his wounds. Unfortunately he is still having signs of infection as well which also has been concerned. Fortunately I do not see any signs of active infection locally nor systemically at this time. No fevers, chills, nausea, vomiting, or diarrhea. 11-06-2022 upon evaluation today patient appears to be doing okay currently in regard to his wounds. All things considered with his blood flow not being nearly as good  as what should I think that he is making pretty good progress here. Fortunately I do not see any signs of infection locally nor systemically which is great news. No fevers, chills, nausea, vomiting, or diarrhea. 11-13-2022 upon evaluation today patient appears to be doing poorly currently in regard to his wounds. Things seem to be getting a little bit worse not better. 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 Subsequently he is also going to need to have vascular intervention but we are unsure exactly when this is going on the undertaking. I would try to actually send a message to Dr. Hazle Quant and the vascular team just to see what exactly the plan is going forward. The patient is in agreement with that plan I just think we need to have a plan especially in light of the fact the left leg seems to be getting a little bit worse at this point. 11-25-2022 upon evaluation today patient is currently now discharged from the hospital following his colostomy procedure. That did very well and he seems to be doing well. Fortunately there does not appear to be any signs of active infection locally or systemically which is great news. No fevers, chills, nausea, vomiting, or diarrhea. 12-04-2022 upon evaluation today patient appears to be doing actually decently well in regard to his wounds and actually very pleased with where we stand and I think that he is making some pretty good progress here. Fortunately I do not see any signs of active infection locally nor systemically which is great news. No fevers, chills, nausea, vomiting, or diarrhea. 12-12-2022 upon evaluation today patient appears to be doing well currently in regard to his wound. He has been tolerating the dressing changes without complication. The wounds all seem to be making some progress to a degree. He has appointment with vascular on the 23rd of this  month. 5/24; the patient was scheduled for a left lower extremity angiogram yesterday but apparently they were overbooked and they wanted to admit him to hospital last night so they could do this early this morning but he refused I see that Dr. Wyn Quaker has rescheduled him for June 6. He is using silver alginate to wounds on his bilateral lower extremities. 01-02-2023 upon  evaluation today patient appears to be doing poorly in regard to his wounds still. Subsequently he did have his angiogram but apparently there really was not much that was found that could be revascularized. The patient tells me that Dr. Wyn Quaker was going to contact me here in the office I have not heard from him as of yet and I may see about giving him a call just to follow-up or possibly send a message through epic which could be an option as well. 01-09-2023 upon evaluation today patient appears to be doing poorly in regard to his legs he is extremely swollen. He has been sleeping in his chair he tells me for the past week. His bed is broken and they have ordered a new one which is supposedly supposed to be there today. With that being said he the way he needs to be not sleeping in his chair that is his wheelchair every day and through the night. This is causing his legs to be much more swollen and it seen and how badly wounds are doing at this point. This has made things significantly worse. Fortunately I do not see any signs of infection right now unfortunately I do believe that the patient has a tremendous amount of drainage currently. 01-19-2023 upon evaluation today patient appears to be doing poorly in regard to his wounds of his legs his swelling is better but still not as good as what we would like to see. Fortunately I do not see any evidence of active infection locally nor systemically which is great news and in general I do believe that we are moving in the right direction here. Nonetheless it would be better if she was in the bed  sleeping or not send up in his wheelchair although he seems to still be set up in the wheelchair at this time. He tells me that he is just not as comfortable in the bed as the wheelchair but unfortunately this is causing damage to his legs which I discussed with him today as well. Nonetheless I am not sure that he is going to be amendable to doing anything different. 01-27-2023 upon evaluation today patient appears to be doing well currently. Fortunately I do not see any evidence of active infection locally nor systemically which is great news I think he is actually doing the best option and while in fact I would try to see if we can do some debridement today to get things moving along. 02-03-2023 upon evaluation today patient's wounds unfortunately are not doing nearly as well. Will be doing the [employer] wraps unfortunately we just not seeing the improvement that I would like to see. Fortunately I do not see any evidence of infection at this time which is good news unfortunately I do think that he needs to be changed on a daily basis due to the amount of drainage otherwise I do not think this is really going to continue to improve dramatically. CESAREO, VICKREY ([id number]) [id number] Page 13 of 15 Objective Constitutional Well-nourished and well-hydrated in no acute distress. Vitals Time Taken: 4:06 PM, Height: 69 in, Weight: 150 lbs, BMI: 22.1, Temperature: 98.3 F, Pulse: 101 bpm, Respiratory Rate: 18 breaths/min, Blood Pressure: 137/87 mmHg. Respiratory normal breathing without difficulty. Psychiatric this patient is able to make decisions and demonstrates good insight into disease process. Alert and Oriented x 3. pleasant and cooperative. General Notes: Upon inspection patient's wound bed actually showed signs at all locations specially the left leg for showing signs of a  lot of drainage and again I think a big portion of this is noncompliance he sits up in his chair  and again this evening with Unna boot wraps is still causing a tremendous amount of drainage. I discussed with him today again that he needs to get in his bed but again it does not sound like that he is going to do it. He tells me that his left knee hurts too bad that is the reason that he is not getting the bed but again there is really nothing I can do to improve his knee I understand this is painful but is something that he is probably going to have to continue to live with it to some degree as I do not think anybody is going to do a knee replacement and he is already becoming contracted if he continues to have these wounds. Integumentary (Hair, Skin) Wound #12 status is Open. Original cause of wound was Gradually Appeared. The date acquired was: 07/12/2019. The wound has been in treatment 60 weeks. The wound is located on the Left,Distal,Medial Ankle. The wound measures 2.5cm length x 1cm width x 0.2cm depth; 1.963cm^2 area and 0.393cm^3 volume. There is a medium amount of serosanguineous drainage noted. Wound #15 status is Open. Original cause of wound was Gradually Appeared. The date acquired was: 10/01/2022. The wound has been in treatment 17 weeks. The wound is located on the Left,Dorsal Foot. The wound measures 6cm length x 3cm width x 0.2cm depth; 14.137cm^2 area and 2.827cm^3 volume. There is a medium amount of serosanguineous drainage noted. There is no granulation within the wound bed. There is a large (67-100%) amount of necrotic tissue within the wound bed including Adherent Slough. Wound #16 status is Open. Original cause of wound was Gradually Appeared. The date acquired was: 09/26/2022. The wound has been in treatment 17 weeks. The wound is located on the Right Calcaneus. The wound measures 3.4cm length x 1.4cm width x 0.1cm depth; 3.738cm^2 area and 0.374cm^3 volume. There is a medium amount of serosanguineous drainage noted. There is small (1-33%) pink granulation within the wound bed.  There is a large (67-100%) amount of necrotic tissue within the wound bed including Adherent Slough. Wound #17 status is Open. Original cause of wound was Pressure Injury. The date acquired was: 09/30/2022. The wound has been in treatment 16 weeks. The wound is located on the Left,Anterior Lower Leg. The wound measures 3.2cm length x 2.4cm width x 0.1cm depth; 6.032cm^2 area and 0.603cm^3 volume. There is a medium amount of serosanguineous drainage noted. There is no granulation within the wound bed. There is a large (67-100%) amount of necrotic tissue within the wound bed including Adherent Slough. Wound #18 status is Open. Original cause of wound was Pressure Injury. The date acquired was: 09/26/2022. The wound has been in treatment 16 weeks. The wound is located on the Right,Medial T Second. The wound measures 1.3cm length x 1cm width x 0.1cm depth; 1.021cm^2 area and 0.102cm^3 volume. oe There is a medium amount of serosanguineous drainage noted. There is no granulation within the wound bed. There is a large (67-100%) amount of necrotic tissue within the wound bed including Adherent Slough. Wound #19 status is Open. Original cause of wound was Gradually Appeared. The date acquired was: 11/10/2022. The wound has been in treatment 11 weeks. The wound is located on the Left,Posterior Lower Leg. The wound measures 3cm length x 1.6cm width x 0.1cm depth; 3.77cm^2 area and 0.377cm^3 volume. There is a medium amount of  serous drainage noted. There is no granulation within the wound bed. There is a large (67-100%) amount of necrotic tissue within the wound bed including Adherent Slough. 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 Nurse Visit as needed Bathing/ Shower/ Hygiene: May shower with wound dressing protected with water repellent cover or cast protector. CURLIE, MACKEN ([id number]) [id number] Page 14 of 15 No tub bath. Anesthetic (Use 'Patient Medications' Section for Anesthetic Order Entry): Lidocaine applied to wound bed Edema Control - Lymphedema / Segmental Compressive Device / Other: Elevate legs to the level of the heart and pump ankles as often as possible Elevate leg(s) parallel to the floor when sitting. Other: - PATIENT MUST SLEEP IN A BED. Legs are swelling due to no elevation of legs. Off-Loading: Heel suspension boot - when in bed Medications-Please add to medication list.: Other: - apply AandD ointment to leg, do not apply to wound WOUND #12: - Ankle Wound Laterality: Left, Medial, Distal Cleanser: Soap and Water 1 x Per Day/30 Days Discharge Instructions: Gently cleanse wound with antibacterial soap, rinse and pat dry prior to dressing wounds Cleanser: Wound Cleanser 1 x Per Day/30 Days Discharge Instructions: Wash your hands with soap and water. Remove old dressing, discard into plastic bag and place into trash. Cleanse the wound with Wound Cleanser prior to applying a clean dressing using gauze sponges, not tissues or cotton balls. Do not scrub or use excessive force. Pat dry using gauze sponges, not tissue or cotton balls. Prim Dressing: Silvercel Small 2x2 (in/in) 1 x Per Day/30 Days ary Discharge Instructions: Apply Silvercel Small 2x2 (in/in) as instructed Secondary Dressing: ABD Pad 5x9 (in/in) 1 x Per Day/30 Days Discharge Instructions: Cover with ABD pad Secured With: ACE WRAP - 42M ACE Elastic Bandage With VELCRO Brand Closure, 4 (in) 1 x Per Day/30 Days WOUND #15: - Foot Wound Laterality: Dorsal, Left Cleanser: Soap  and Water 1 x Per Day/30 Days Discharge Instructions: Gently cleanse wound with antibacterial soap, rinse and pat dry prior to dressing wounds Cleanser: Wound Cleanser 1 x Per Day/30 Days Discharge Instructions: Wash your hands with soap and water. Remove old dressing, discard into plastic bag and place into trash. Cleanse the wound with Wound Cleanser prior to applying a clean dressing using gauze sponges, not tissues or cotton balls. Do not scrub or use excessive force. Pat dry using gauze sponges, not tissue or cotton balls. Prim Dressing: Silvercel Small 2x2 (in/in) 1 x Per Day/30 Days ary Discharge Instructions: Apply Silvercel Small 2x2 (in/in) as instructed Secondary Dressing: ABD Pad 5x9 (in/in) 1 x Per Day/30 Days Discharge Instructions: Cover with ABD pad Secured With: ACE WRAP - 42M ACE Elastic Bandage With VELCRO Brand Closure, 4 (in) 1 x Per Day/30 Days WOUND #16: - Calcaneus Wound Laterality: Right Cleanser: Soap and Water 1 x Per Day/30 Days Discharge Instructions: Gently cleanse wound with antibacterial soap, rinse and pat dry prior to dressing wounds Cleanser: Wound Cleanser 1 x Per Day/30 Days Discharge Instructions: Wash your hands with soap and water. Remove old dressing, discard into plastic bag and place into trash. Cleanse the wound with Wound Cleanser prior to applying a clean dressing using gauze sponges,  not tissues or cotton balls. Do not scrub or use excessive force. Pat dry using gauze sponges, not tissue or cotton balls. Prim Dressing: Silvercel Small 2x2 (in/in) 1 x Per Day/30 Days ary Discharge Instructions: Apply Silvercel Small 2x2 (in/in) as instructed Secondary Dressing: ABD Pad 5x9 (in/in) 1 x Per Day/30 Days Discharge Instructions: Cover with ABD pad Secured With: ACE WRAP - 51M ACE Elastic Bandage With VELCRO Brand Closure, 4 (in) 1 x Per Day/30 Days WOUND #17: - Lower Leg Wound Laterality: Left, Anterior Cleanser: Soap and Water 1 x Per Day/30  Days Discharge Instructions: Gently cleanse wound with antibacterial soap, rinse and pat dry prior to dressing wounds Cleanser: Wound Cleanser 1 x Per Day/30 Days Discharge Instructions: Wash your hands with soap and water. Remove old dressing, discard into plastic bag and place into trash. Cleanse the wound with Wound Cleanser prior to applying a clean dressing using gauze sponges, not tissues or cotton balls. Do not scrub or use excessive force. Pat dry using gauze sponges, not tissue or cotton balls. Prim Dressing: Silvercel Small 2x2 (in/in) 1 x Per Day/30 Days ary Discharge Instructions: Apply Silvercel Small 2x2 (in/in) as instructed Secondary Dressing: ABD Pad 5x9 (in/in) 1 x Per Day/30 Days Discharge Instructions: Cover with ABD pad Secured With: ACE WRAP - 51M ACE Elastic Bandage With VELCRO Brand Closure, 4 (in) 1 x Per Day/30 Days WOUND #18: - T Second Wound Laterality: Right, Medial oe Cleanser: Soap and Water 1 x Per Day/30 Days Discharge Instructions: Gently cleanse wound with antibacterial soap, rinse and pat dry prior to dressing wounds Cleanser: Wound Cleanser 1 x Per Day/30 Days Discharge Instructions: Wash your hands with soap and water. Remove old dressing, discard into plastic bag and place into trash. Cleanse the wound with Wound Cleanser prior to applying a clean dressing using gauze sponges, not tissues or cotton balls. Do not scrub or use excessive force. Pat dry using gauze sponges, not tissue or cotton balls. Prim Dressing: Silvercel Small 2x2 (in/in) 1 x Per Day/30 Days ary Discharge Instructions: Apply Silvercel Small 2x2 (in/in) as instructed Secondary Dressing: Coverlet Latex-Free Fabric Adhesive Dressings 1 x Per Day/30 Days Discharge Instructions: Knuckle WOUND #19: - Lower Leg Wound Laterality: Left, Posterior Cleanser: Soap and Water 1 x Per Day/30 Days Discharge Instructions: Gently cleanse wound with antibacterial soap, rinse and pat dry prior to  dressing wounds Cleanser: Wound Cleanser 1 x Per Day/30 Days Discharge Instructions: Wash your hands with soap and water. Remove old dressing, discard into plastic bag and place into trash. Cleanse the wound with Wound Cleanser prior to applying a clean dressing using gauze sponges, not tissues or cotton balls. Do not scrub or use excessive force. Pat dry using gauze sponges, not tissue or cotton balls. Prim Dressing: Silvercel Small 2x2 (in/in) 1 x Per Day/30 Days ary Discharge Instructions: Apply Silvercel Small 2x2 (in/in) as instructed Secondary Dressing: ABD Pad 5x9 (in/in) 1 x Per Day/30 Days Discharge Instructions: Cover with ABD pad Secured With: ACE WRAP - 51M ACE Elastic Bandage With VELCRO Brand Closure, 4 (in) 1 x Per Day/30 Days 1. Based on what I am seeing I do believe that the patient would benefit from a continuation of therapy with regard to the silver alginate dressing which I will go ahead and continue with at this point. 2. I am good recommend as well that the patient should use ABD pads and roll gauze to secure in place okay to be utilizing an Ace wrap that can  be changed daily he is in agreement with that plan. We will see patient back for reevaluation in 1 week here in the clinic. If anything worsens or changes patient will contact our office for additional recommendations. JOHNATHA, ZEIDMAN ([id number]) [id number] Page 15 of 15 Electronic Signature(s) Signed: 02/03/2023 6:07:35 PM By: [name] PA-C Entered By: [name] on 02/03/2023 18:07:35 -------------------------------------------------------------------------------- SuperBill Details Patient Name: Date of Service: NORIS, KULINSKI 02/03/2023 Medical Record Number: [id number] Patient Account Number: [id number] Date of Birth/Sex: Treating RN: 06/21/1961 (62 y.o. [name] Primary Care Provider: [name] Other Clinician: Referring Provider: Treating Provider/Extender: [name] Weeks in Treatment: 60 Diagnosis Coding ICD-10 Codes Code Description [phone] Chronic venous hypertension (idiopathic) with ulcer and inflammation of left lower extremity 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 side 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, stage 3 Facility Procedures : CPT4 Code: [id number] Description: 02725 - WOUND CARE VISIT-LEV 5 EST PT Modifier: Quantity: 1 Physician Procedures : CPT4 Code Description Modifier [id number] - WC PHYS LEVEL 3 - EST PT ICD-10 Diagnosis Description 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 (idiopathic) with ulcer and inflammation of left lower extremity L97.322 Non-pressure chronic ulcer of left ankle  with fat layer exposed L98.492 Non-pressure chronic ulcer of skin of other sites with fat layer exposed 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 side Quantity: 1 Electronic Signature(s) Signed: 02/04/2023 12:04:15 PM By: Midge Aver MSN RN CNS WTA Signed: 02/05/2023 5:17:09 PM By: [name] PA-C Previous Signature: 02/03/2023 6:07:52 PM Version By: [name] PA-C Entered By: Midge Aver on 02/04/2023 12:04:14

## 2023-02-03 NOTE — Progress Notes (Addendum)
ALTARIQ, GOODALL ([id number]) [id number] Page 1 of 18 Visit Report for 02/03/2023 Arrival Information Details Patient Name: Date of Service: Chad [name], Chad [name] 02/03/2023 3:30 PM Medical Record Number: [id number] Patient Account Number: [id number] Date of Birth/Sex: Treating RN: 03-25-1961 (62 y.o. [name] Primary Care Natallia Stellmach: [name] Other Clinician: Referring Flo Berroa: Treating Ac Colan/Extender: [name] in Treatment: 60 Visit Information History Since Last Visit Added or deleted any medications: No Patient Arrived: Wheel Chair Any new allergies or adverse reactions: No Arrival Time: 15:57 Has Dressing in Place as Prescribed: Yes Accompanied By: self Has Compression in Place as Prescribed: Yes Transfer Assistance: None Pain Present Now: No Patient Identification Verified: Yes Secondary Verification Process Completed: Yes Patient Requires Transmission-Based No Precautions: Patient Has Alerts: Yes Patient Alerts: Patient on Blood Thinner NOT diabetic aspirin 81mg  Lives [place] ABI R 0.81 11/05/22 ABI L 0.59 11/05/22 Electronic Signature(s) Signed: 02/03/2023 4:38:46 PM By: Midge Aver MSN RN CNS WTA Entered By: Midge Aver on 02/03/2023 16:38:46 -------------------------------------------------------------------------------- Clinic Level of Care Assessment Details Patient Name: Date of Service: Chad [name], Chad [name] 02/03/2023 3:30 PM Medical Record Number: [id number] Patient Account Number: [id number] Date of Birth/Sex: Treating RN: 09-20-1960 (62 y.o. [name] Primary Care Koty Anctil: [name] Other Clinician: Referring Lataysha Vohra: Treating Levander Katzenstein/Extender: [name] Weeks in Treatment: 60 Clinic Level of Care Assessment Items TOOL 4 Quantity Score X- 1 0 Use when only an EandM is performed on FOLLOW-UP visit ASSESSMENTS - Nursing Assessment / Reassessment X- 1 10 Reassessment  of Co-morbidities (includes updates in patient status) Chad [name], Chad [name] ([id number]) [id number] Page 2 of 18 X- 1 5 Reassessment of Adherence to Treatment Plan ASSESSMENTS - Wound and Skin A ssessment / Reassessment []  - 0 Simple Wound Assessment / Reassessment - one wound X- 6 5 Complex Wound Assessment / Reassessment - multiple wounds []  - 0 Dermatologic / Skin Assessment (not related to wound area) ASSESSMENTS - Focused Assessment []  - 0 Circumferential Edema Measurements - multi extremities []  - 0 Nutritional Assessment / Counseling / Intervention []  - 0 Lower Extremity Assessment (monofilament, tuning fork, pulses) []  - 0 Peripheral Arterial Disease Assessment (using hand held doppler) ASSESSMENTS - Ostomy and/or Continence Assessment and Care []  - 0 Incontinence Assessment and Management []  - 0 Ostomy Care Assessment and Management (repouching, etc.) PROCESS - Coordination of Care []  - 0 Simple Patient / Family Education for ongoing care X- 1 20 Complex (extensive) Patient / Family Education for ongoing care X- 1 10 Staff obtains Chiropractor, Records, T Results / Process Orders est []  - 0 Staff telephones HHA, Nursing Homes / Clarify orders / etc []  - 0 Routine Transfer to another Facility (non-emergent condition) []  - 0 Routine Hospital Admission (non-emergent condition) []  - 0 New Admissions / Manufacturing engineer / Ordering NPWT Apligraf, etc. , []  - 0 Emergency Hospital Admission (emergent condition) []  - 0 Simple Discharge Coordination X- 1 15 Complex (extensive) Discharge Coordination PROCESS - Special Needs []  - 0 Pediatric / Minor Patient Management []  - 0 Isolation Patient Management []  - 0 Hearing / Language / Visual special needs []  - 0 Assessment of Community assistance (transportation, D/C planning, etc.) []  - 0 Additional assistance / Altered mentation []  - 0 Support Surface(s) Assessment (bed, cushion, seat,  etc.) INTERVENTIONS - Wound Cleansing / Measurement []  - 0 Simple Wound Cleansing - one wound X- 6 5 Complex Wound Cleansing - multiple wounds X- 1 5 Wound Imaging (photographs - any number of wounds) []  -  0 Wound Tracing (instead of photographs) []  - 0 Simple Wound Measurement - one wound X- 6 5 Complex Wound Measurement - multiple wounds INTERVENTIONS - Wound Dressings X - Small Wound Dressing one or multiple wounds 6 10 []  - 0 Medium Wound Dressing one or multiple wounds []  - 0 Large Wound Dressing one or multiple wounds []  - 0 Application of Medications - topical []  - 0 Application of Medications - injection Sroka, [name] ([id number]) [id number] Page 3 of 18 INTERVENTIONS - Miscellaneous []  - 0 External ear exam []  - 0 Specimen Collection (cultures, biopsies, blood, body fluids, etc.) []  - 0 Specimen(s) / Culture(s) sent or taken to Lab for analysis []  - 0 Patient Transfer (multiple staff / Michiel Sites Lift / Similar devices) []  - 0 Simple Staple / Suture removal (25 or less) []  - 0 Complex Staple / Suture removal (26 or more) []  - 0 Hypo / Hyperglycemic Management (close monitor of Blood Glucose) []  - 0 Ankle / Brachial Index (ABI) - do not check if billed separately X- 1 5 Vital Signs Has the patient been seen at the hospital within the last three years: Yes Total Score: 220 Level Of Care: New/Established - Level 5 Electronic Signature(s) Signed: 02/05/2023 5:16:05 PM By: Midge Aver MSN RN CNS WTA Entered By: Midge Aver on 02/04/2023 12:04:06 -------------------------------------------------------------------------------- Encounter Discharge Information Details Patient Name: Date of Service: Chad [name] 02/03/2023 3:30 PM Medical Record Number: [id number] Patient Account Number: [id number] Date of Birth/Sex: Treating RN: [birth year] (62 y.o. [name] Primary Care Jessica Seidman: [name] Other Clinician: Referring  Fernandez Kenley: Treating Keedan Sample/Extender: [name] in Treatment: 60 Encounter Discharge Information Items Discharge Condition: Stable Ambulatory Status: Wheelchair Discharge Destination: Home Transportation: Private Auto Accompanied By: self Schedule Follow-up Appointment: Yes Clinical Summary of Care: Electronic Signature(s) Signed: 02/04/2023 12:05:37 PM By: Midge Aver MSN RN CNS WTA Entered By: Midge Aver on 02/04/2023 12:05:37 [name] ([id number]) [id number] Page 4 of 18 -------------------------------------------------------------------------------- Lower Extremity Assessment Details Patient Name: Date of Service: Chad [name], Chad [name] 02/03/2023 3:30 PM Medical Record Number: [id number] Patient Account Number: [id number] Date of Birth/Sex: Treating RN: Jun 29, 1961 (62 y.o. [name] Primary Care Satoru Milich: [name] Other Clinician: Referring Jamear Carbonneau: Treating Nandita Mathenia/Extender: [name] Weeks in Treatment: 60 Electronic Signature(s) Signed: 02/04/2023 12:01:48 PM By: Midge Aver MSN RN CNS WTA Entered By: Midge Aver on 02/04/2023 12:01:47 -------------------------------------------------------------------------------- Multi Wound Chart Details Patient Name: Date of Service: Chad [name], Chad [name] 02/03/2023 3:30 PM Medical Record Number: [id number] Patient Account Number: [id number] Date of Birth/Sex: Treating RN: 1961/05/29 (62 y.o. [name] Primary Care Alfreida Steffenhagen: [name] Other Clinician: Referring Alyze Lauf: Treating Dontee Jaso/Extender: [name] Weeks in Treatment: 60 Vital Signs Height(in): 69 Pulse(bpm): 101 Weight(lbs): 150 Blood Pressure(mmHg): 137/87 Body Mass Index(BMI): 22.1 Temperature(F): 98.3 Respiratory Rate(breaths/min): 18 [12:Photos:] Left, Distal, Medial Ankle Left, Dorsal Foot Right Calcaneus Wound Location: Gradually Appeared Gradually Appeared  Gradually Appeared Wounding Event: Venous Leg Ulcer Venous Leg Ulcer Pressure Ulcer Primary Etiology: Anemia, Chronic Obstructive Anemia, Chronic Obstructive Anemia, Chronic Obstructive Comorbid History: Pulmonary Disease (COPD), Coronary Pulmonary Disease (COPD), Coronary Pulmonary Disease (COPD), Coronary Artery Disease, Peripheral Arterial Artery Disease, Peripheral Arterial Artery Disease, Peripheral Arterial Disease, Peripheral Venous Disease, Disease, Peripheral Venous Disease, Disease, Peripheral Venous Disease, Hepatitis C, Osteoarthritis, Hepatitis C, Osteoarthritis, Hepatitis C, Osteoarthritis, Neuropathy, Received Chemotherapy Neuropathy, Received Chemotherapy Neuropathy, Received Chemotherapy 07/12/2019 10/01/2022 09/26/2022 Date Acquired: 60 17 17 Weeks of Treatment: Open Open Open Wound Status: No No No  Wound Recurrence: No Yes Yes Clustered Wound: 2.5x1x0.2 6x3x0.2 3.4x1.4x0.1 Measurements L x W x D (cm) 1.963 14.137 3.738 A (cm) : rea 0.393 2.827 0.374 Volume (cm) : 71.60% -500.00% -296.80% % Reduction in Area: 71.60% -1097.90% -98.90% % Reduction in Volume: Full Thickness Without Exposed Full Thickness Without Exposed Category/Stage III Classification: Support Structures Support Structures Medium Medium Medium Exudate Amount: [name] ([id number]) [id number] Page 5 of 18 Serosanguineous Serosanguineous Serosanguineous Exudate Type: red, brown red, brown red, brown Exudate Color: N/A None Present (0%) Small (1-33%) Granulation A mount: N/A N/A Pink Granulation Quality: N/A Large (67-100%) Large (67-100%) Necrotic A mount: N/A None None Epithelialization: Wound Number: 17 18 19  Photos: Left, Anterior Lower Leg Right, Medial T Second oe Left, Posterior Lower Leg Wound Location: Pressure Injury Pressure Injury Gradually Appeared Wounding Event: Pressure Ulcer Pressure Ulcer Venous Leg Ulcer Primary Etiology: Anemia,  Chronic Obstructive Anemia, Chronic Obstructive Anemia, Chronic Obstructive Comorbid History: Pulmonary Disease (COPD), Coronary Pulmonary Disease (COPD), Coronary Pulmonary Disease (COPD), Coronary Artery Disease, Peripheral Arterial Artery Disease, Peripheral Arterial Artery Disease, Peripheral Arterial Disease, Peripheral Venous Disease, Disease, Peripheral Venous Disease, Disease, Peripheral Venous Disease, Hepatitis C, Osteoarthritis, Hepatitis C, Osteoarthritis, Hepatitis C, Osteoarthritis, Neuropathy, Received Chemotherapy Neuropathy, Received Chemotherapy Neuropathy, Received Chemotherapy 09/30/2022 09/26/2022 11/10/2022 Date Acquired: 16 16 11  Weeks of Treatment: Open Open Open Wound Status: No No No Wound Recurrence: No No Yes Clustered Wound: 3.2x2.4x0.1 1.3x1x0.1 3x1.6x0.1 Measurements L x W x D (cm) 6.032 1.021 3.77 A (cm) : rea 0.603 0.102 0.377 Volume (cm) : -46.30% -85.60% -433.20% % Reduction in Area: -46.40% -85.50% -431.00% % Reduction in Volume: Category/Stage II Category/Stage II Full Thickness Without Exposed Classification: Support Structures Medium Medium Medium Exudate A mount: Serosanguineous Serosanguineous Serous Exudate Type: red, brown red, brown amber Exudate Color: None Present (0%) None Present (0%) None Present (0%) Granulation A mount: N/A N/A N/A Granulation Quality: Large (67-100%) Large (67-100%) Large (67-100%) Necrotic A mount: None None None Epithelialization: Treatment Notes Wound #12 (Ankle) Wound Laterality: Left, Medial, Distal Cleanser Soap and Water Discharge Instruction: Gently cleanse wound with antibacterial soap, rinse and pat dry prior to dressing wounds Wound Cleanser Discharge Instruction: Wash your hands with soap and water. Remove old dressing, discard into plastic bag and place into trash. Cleanse the wound with Wound Cleanser prior to applying a clean dressing using gauze sponges, not tissues or cotton balls. Do  not scrub or use excessive force. Pat dry using gauze sponges, not tissue or cotton balls. Peri-Wound Care Topical Primary Dressing Silvercel Small 2x2 (in/in) Discharge Instruction: Apply Silvercel Small 2x2 (in/in) as instructed Secondary Dressing ABD Pad 5x9 (in/in) Discharge Instruction: Cover with ABD pad Secured With ACE WRAP - 55M ACE Elastic Bandage With VELCRO Brand Closure, 4 (in) Compression Wrap Compression Stockings Add-Ons Wound #15 (Foot) Wound Laterality: Dorsal, Left [name] ([id number]) [id number] Page 6 of 18 Cleanser Soap and Water Discharge Instruction: Gently cleanse wound with antibacterial soap, rinse and pat dry prior to dressing wounds Wound Cleanser Discharge Instruction: Wash your hands with soap and water. Remove old dressing, discard into plastic bag and place into trash. Cleanse the wound with Wound Cleanser prior to applying a clean dressing using gauze sponges, not tissues or cotton balls. Do not scrub or use excessive force. Pat dry using gauze sponges, not tissue or cotton balls. Peri-Wound Care Topical Primary Dressing Silvercel Small 2x2 (in/in) Discharge Instruction: Apply Silvercel Small 2x2 (in/in) as instructed Secondary Dressing ABD Pad 5x9 (in/in) Discharge Instruction: Cover with ABD  pad Secured With ACE WRAP - 52M ACE Elastic Bandage With VELCRO Brand Closure, 4 (in) Compression Wrap Compression Stockings Add-Ons Wound #16 (Calcaneus) Wound Laterality: Right Cleanser Soap and Water Discharge Instruction: Gently cleanse wound with antibacterial soap, rinse and pat dry prior to dressing wounds Wound Cleanser Discharge Instruction: Wash your hands with soap and water. Remove old dressing, discard into plastic bag and place into trash. Cleanse the wound with Wound Cleanser prior to applying a clean dressing using gauze sponges, not tissues or cotton balls. Do not scrub or use excessive force. Pat dry  using gauze sponges, not tissue or cotton balls. Peri-Wound Care Topical Primary Dressing Silvercel Small 2x2 (in/in) Discharge Instruction: Apply Silvercel Small 2x2 (in/in) as instructed Secondary Dressing ABD Pad 5x9 (in/in) Discharge Instruction: Cover with ABD pad Secured With ACE WRAP - 52M ACE Elastic Bandage With VELCRO Brand Closure, 4 (in) Compression Wrap Compression Stockings Add-Ons Wound #17 (Lower Leg) Wound Laterality: Left, Anterior Cleanser Soap and Water Discharge Instruction: Gently cleanse wound with antibacterial soap, rinse and pat dry prior to dressing wounds Wound Cleanser Discharge Instruction: Wash your hands with soap and water. Remove old dressing, discard into plastic bag and place into trash. Cleanse the wound with Wound Cleanser prior to applying a clean dressing using gauze sponges, not tissues or cotton balls. Do not scrub or use excessive force. Pat dry using gauze sponges, not tissue or cotton balls. Peri-Wound Care Topical Primary Dressing Silvercel Small 2x2 (in/in) Discharge Instruction: Apply Silvercel Small 2x2 (in/in) as instructed Chad [name], Chad [name] ([id number]) [id number] Page 7 of 18 Secondary Dressing ABD Pad 5x9 (in/in) Discharge Instruction: Cover with ABD pad Secured With ACE WRAP - 52M ACE Elastic Bandage With VELCRO Brand Closure, 4 (in) Compression Wrap Compression Stockings Add-Ons Wound #18 (Toe Second) Wound Laterality: Right, Medial Cleanser Soap and Water Discharge Instruction: Gently cleanse wound with antibacterial soap, rinse and pat dry prior to dressing wounds Wound Cleanser Discharge Instruction: Wash your hands with soap and water. Remove old dressing, discard into plastic bag and place into trash. Cleanse the wound with Wound Cleanser prior to applying a clean dressing using gauze sponges, not tissues or cotton balls. Do not scrub or use excessive force. Pat dry using gauze sponges, not  tissue or cotton balls. Peri-Wound Care Topical Primary Dressing Silvercel Small 2x2 (in/in) Discharge Instruction: Apply Silvercel Small 2x2 (in/in) as instructed Secondary Dressing Coverlet Latex-Free Fabric Adhesive Dressings Discharge Instruction: Knuckle Secured With Compression Wrap Compression Stockings Add-Ons Wound #19 (Lower Leg) Wound Laterality: Left, Posterior Cleanser Soap and Water Discharge Instruction: Gently cleanse wound with antibacterial soap, rinse and pat dry prior to dressing wounds Wound Cleanser Discharge Instruction: Wash your hands with soap and water. Remove old dressing, discard into plastic bag and place into trash. Cleanse the wound with Wound Cleanser prior to applying a clean dressing using gauze sponges, not tissues or cotton balls. Do not scrub or use excessive force. Pat dry using gauze sponges, not tissue or cotton balls. Peri-Wound Care Topical Primary Dressing Silvercel Small 2x2 (in/in) Discharge Instruction: Apply Silvercel Small 2x2 (in/in) as instructed Secondary Dressing ABD Pad 5x9 (in/in) Discharge Instruction: Cover with ABD pad Secured With ACE WRAP - 52M ACE Elastic Bandage With VELCRO Brand Closure, 4 (in) Compression Wrap Compression Stockings Add-Ons Electronic Signature(s) Signed: 02/04/2023 12:03:05 PM By: Midge Aver MSN RN CNS Darnelle Going, [name] ([id number]) [id number] Page 8 of 18 Entered By: Midge Aver on 02/04/2023 12:03:05 -------------------------------------------------------------------------------- Multi-Disciplinary Care Plan Details Patient Name: Date of Service:  Chad [name], Chad [name] [name] 02/03/2023 3:30 PM Medical Record Number: [id number] Patient Account Number: [id number] Date of Birth/Sex: Treating RN: 10-19-1960 (62 y.o. [name] Primary Care Taunja Brickner: [name] Other Clinician: Referring Daianna Vasques: Treating Rhylei Mcquaig/Extender: [name] Weeks in Treatment:  60 Active Inactive Venous Leg Ulcer Nursing Diagnoses: Knowledge deficit related to disease process and management Goals: Patient will maintain optimal edema control Date Initiated: 12/06/2021 Target Resolution Date: 02/22/2022 Goal Status: Active Patient/caregiver will verbalize understanding of disease process and disease management Date Initiated: 12/06/2021 Date Inactivated: 12/19/2022 Target Resolution Date: 11/27/2022 Goal Status: Met Interventions: Assess peripheral edema status every visit. Compression as ordered Notes: Electronic Signature(s) Signed: 02/04/2023 12:04:25 PM By: Midge Aver MSN RN CNS WTA Entered By: Midge Aver on 02/04/2023 12:04:24 -------------------------------------------------------------------------------- Pain Assessment Details Patient Name: Date of Service: RIK, WADEL 02/03/2023 3:30 PM Medical Record Number: [id number] Patient Account Number: [id number] Date of Birth/Sex: Treating RN: Jul 21, 1961 (62 y.o. [name] Primary Care Kniyah Khun: [name] Other Clinician: Referring Claretta Kendra: Treating Enya Bureau/Extender: [name] Weeks in Treatment: [address] FUTURE, YELDELL ([id number]) [id number] Page 9 of 18 Location of Pain Severity and Description of Pain Patient Has Paino Yes Site Locations Rate the pain. Current Pain Level: 7 Character of Pain Describe the Pain: Aching, Exhausting, Heavy, Tender, Throbbing Pain Management and Medication Current Pain Management: Electronic Signature(s) Signed: 02/04/2023 12:01:38 PM By: Midge Aver MSN RN CNS WTA Previous Signature: 02/03/2023 4:38:55 PM Version By: Midge Aver MSN RN CNS WTA Entered By: Midge Aver on 02/04/2023 12:01:38 -------------------------------------------------------------------------------- Patient/Caregiver Education Details Patient Name: Date of Service: Denna Haggard 7/9/2024andnbsp3:30 PM Medical Record  Number: [id number] Patient Account Number: [id number] Date of Birth/Gender: Treating RN: 1961/04/27 (62 y.o. [name] Primary Care Physician: [name] Other Clinician: Referring Physician: Treating Physician/Extender: [name] in Treatment: 60 Education Assessment Education Provided To: Patient Education Topics Provided Wound/Skin Impairment: Handouts: Caring for Your Ulcer Methods: Explain/Verbal Responses: State content correctly Electronic Signature(s) Signed: 02/05/2023 5:16:05 PM By: Midge Aver MSN RN CNS WTA Entered By: Midge Aver on 02/04/2023 12:04:40 [name] ([id number]) [id number] Page 10 of 18 -------------------------------------------------------------------------------- Wound Assessment Details Patient Name: Date of Service: Chad [name], Chad [name] 02/03/2023 3:30 PM Medical Record Number: [id number] Patient Account Number: [id number] Date of Birth/Sex: Treating RN: Mar 23, 1961 (62 y.o. [name] Primary Care Kermit Arnette: [name] Other Clinician: Referring Tavis Kring: Treating Lizzie Cokley/Extender: [name] Weeks in Treatment: 60 Wound Status Wound Number: 12 Primary Venous Leg Ulcer Etiology: Wound Location: Left, Distal, Medial Ankle Wound Open Wounding Event: Gradually Appeared Status: Date Acquired: 07/12/2019 Comorbid Anemia, Chronic Obstructive Pulmonary Disease (COPD), Coronary Weeks Of Treatment: 60 History: Artery Disease, Peripheral Arterial Disease, Peripheral Venous Clustered Wound: No Disease, Hepatitis C, Osteoarthritis, Neuropathy, Received Chemotherapy Photos Wound Measurements Length: (cm) 2.5 Width: (cm) 1 Depth: (cm) 0.2 Area: (cm) 1.963 Volume: (cm) 0.393 % Reduction in Area: 71.6% % Reduction in Volume: 71.6% Wound Description Classification: Full Thickness Without Exposed Support Exudate Amount: Medium Exudate Type: Serosanguineous Exudate Color:  red, brown Structures Treatment Notes Wound #12 (Ankle) Wound Laterality: Left, Medial, Distal Cleanser Soap and Water Discharge Instruction: Gently cleanse wound with antibacterial soap, rinse and pat dry prior to dressing wounds Wound Cleanser Discharge Instruction: Wash your hands with soap and water. Remove old dressing, discard into plastic bag and place into trash. Cleanse the wound with Wound Cleanser prior to applying a clean dressing using gauze sponges, not tissues or cotton balls. Do not  scrub or use excessive force. Pat dry using gauze sponges, not tissue or cotton balls. Peri-Wound Care Topical DANNON, NGUYENTHI ([id number]) [id number] Page 11 of 18 Primary Dressing Silvercel Small 2x2 (in/in) Discharge Instruction: Apply Silvercel Small 2x2 (in/in) as instructed Secondary Dressing ABD Pad 5x9 (in/in) Discharge Instruction: Cover with ABD pad Secured With ACE WRAP - 46M ACE Elastic Bandage With VELCRO Brand Closure, 4 (in) Compression Wrap Compression Stockings Add-Ons Electronic Signature(s) Signed: 02/05/2023 5:16:05 PM By: Midge Aver MSN RN CNS WTA Entered By: Midge Aver on 02/03/2023 16:43:55 -------------------------------------------------------------------------------- Wound Assessment Details Patient Name: Date of Service: Chad [name] 02/03/2023 3:30 PM Medical Record Number: [id number] Patient Account Number: [id number] Date of Birth/Sex: Treating RN: [birth year] (62 y.o. [name] Primary Care Aidyn Kellis: [name] Other Clinician: Referring Yoshimi Sarr: Treating Tacy Chavis/Extender: [name] Weeks in Treatment: 60 Wound Status Wound Number: 15 Primary Venous Leg Ulcer Etiology: Wound Location: Left, Dorsal Foot Wound Open Wounding Event: Gradually Appeared Status: Date Acquired: 10/01/2022 Comorbid Anemia, Chronic Obstructive Pulmonary Disease (COPD), Coronary Weeks Of Treatment: 17 History: Artery  Disease, Peripheral Arterial Disease, Peripheral Venous Clustered Wound: Yes Disease, Hepatitis C, Osteoarthritis, Neuropathy, Received Chemotherapy Photos Wound Measurements Length: (cm) 6 Width: (cm) 3 Depth: (cm) 0.2 Area: (cm) 14.137 Volume: (cm) 2.827 % Reduction in Area: -500% % Reduction in Volume: -1097.9% Epithelialization: None Wound Description DEWAUN, KINZLER ([id number]) Classification: Full Thickness Without Exposed Support Structures Exudate Amount: Medium Exudate Type: Serosanguineous Exudate Color: red, brown [id number] Page 12 of 18 Wound Bed Granulation Amount: None Present (0%) Necrotic Amount: Large (67-100%) Necrotic Quality: Adherent Slough Treatment Notes Wound #15 (Foot) Wound Laterality: Dorsal, Left Cleanser Soap and Water Discharge Instruction: Gently cleanse wound with antibacterial soap, rinse and pat dry prior to dressing wounds Wound Cleanser Discharge Instruction: Wash your hands with soap and water. Remove old dressing, discard into plastic bag and place into trash. Cleanse the wound with Wound Cleanser prior to applying a clean dressing using gauze sponges, not tissues or cotton balls. Do not scrub or use excessive force. Pat dry using gauze sponges, not tissue or cotton balls. Peri-Wound Care Topical Primary Dressing Silvercel Small 2x2 (in/in) Discharge Instruction: Apply Silvercel Small 2x2 (in/in) as instructed Secondary Dressing ABD Pad 5x9 (in/in) Discharge Instruction: Cover with ABD pad Secured With ACE WRAP - 46M ACE Elastic Bandage With VELCRO Brand Closure, 4 (in) Compression Wrap Compression Stockings Add-Ons Electronic Signature(s) Signed: 02/05/2023 5:16:05 PM By: Midge Aver MSN RN CNS WTA Entered By: Midge Aver on 02/03/2023 16:44:39 -------------------------------------------------------------------------------- Wound Assessment Details Patient Name: Date of Service: Chad [name]  02/03/2023 3:30 PM Medical Record Number: [id number] Patient Account Number: [id number] Date of Birth/Sex: Treating RN: [birth year] (62 y.o. [name] Primary Care Lawarence Meek: [name] Other Clinician: Referring Yuriko Portales: Treating Alyssha Housh/Extender: [name] Weeks in Treatment: 60 Wound Status Wound Number: 16 Primary Pressure Ulcer Etiology: Wound Location: Right Calcaneus Wound Open Wounding Event: Gradually Appeared Status: Date Acquired: 09/26/2022 Comorbid Anemia, Chronic Obstructive Pulmonary Disease (COPD), Coronary Weeks Of Treatment: 17 History: Artery Disease, Peripheral Arterial Disease, Peripheral Venous Clustered Wound: Yes [name] ([id number]) [id number] Page 13 of 18 Clustered Wound: Yes Disease, Hepatitis C, Osteoarthritis, Neuropathy, Received Chemotherapy Photos Wound Measurements Length: (cm) 3.4 Width: (cm) 1.4 Depth: (cm) 0.1 Area: (cm) 3.738 Volume: (cm) 0.374 % Reduction in Area: -296.8% % Reduction in Volume: -98.9% Epithelialization: None Wound Description Classification: Category/Stage III Exudate Amount: Medium Exudate Type: Serosanguineous Exudate Color: red, brown Wound Bed Granulation Amount: Small (  1-33%) Granulation Quality: Pink Necrotic Amount: Large (67-100%) Necrotic Quality: Adherent Slough Treatment Notes Wound #16 (Calcaneus) Wound Laterality: Right Cleanser Soap and Water Discharge Instruction: Gently cleanse wound with antibacterial soap, rinse and pat dry prior to dressing wounds Wound Cleanser Discharge Instruction: Wash your hands with soap and water. Remove old dressing, discard into plastic bag and place into trash. Cleanse the wound with Wound Cleanser prior to applying a clean dressing using gauze sponges, not tissues or cotton balls. Do not scrub or use excessive force. Pat dry using gauze sponges, not tissue or cotton balls. Peri-Wound Care Topical Primary  Dressing Silvercel Small 2x2 (in/in) Discharge Instruction: Apply Silvercel Small 2x2 (in/in) as instructed Secondary Dressing ABD Pad 5x9 (in/in) Discharge Instruction: Cover with ABD pad Secured With ACE WRAP - 2M ACE Elastic Bandage With VELCRO Brand Closure, 4 (in) Compression Wrap Compression Stockings Add-Ons Electronic Signature(s) Signed: 02/05/2023 5:16:05 PM By: Midge Aver MSN RN CNS WTA Entered By: Midge Aver on 02/03/2023 16:45:14 [name] ([id number]) [id number] Page 14 of 18 -------------------------------------------------------------------------------- Wound Assessment Details Patient Name: Date of Service: Chad [name], Chad [name] 02/03/2023 3:30 PM Medical Record Number: [id number] Patient Account Number: [id number] Date of Birth/Sex: Treating RN: May 07, 1961 (62 y.o. [name] Primary Care Cloe Sockwell: [name] Other Clinician: Referring Kayleann Mccaffery: Treating Gloris Shiroma/Extender: [name] Weeks in Treatment: 60 Wound Status Wound Number: 17 Primary Pressure Ulcer Etiology: Wound Location: Left, Anterior Lower Leg Wound Open Wounding Event: Pressure Injury Status: Date Acquired: 09/30/2022 Comorbid Anemia, Chronic Obstructive Pulmonary Disease (COPD), Coronary Weeks Of Treatment: 16 History: Artery Disease, Peripheral Arterial Disease, Peripheral Venous Clustered Wound: No Disease, Hepatitis C, Osteoarthritis, Neuropathy, Received Chemotherapy Photos Wound Measurements Length: (cm) 3.2 Width: (cm) 2.4 Depth: (cm) 0.1 Area: (cm) 6.032 Volume: (cm) 0.603 % Reduction in Area: -46.3% % Reduction in Volume: -46.4% Epithelialization: None Wound Description Classification: Category/Stage II Exudate Amount: Medium Exudate Type: Serosanguineous Exudate Color: red, brown Foul Odor After Cleansing: No Slough/Fibrino Yes Wound Bed Granulation Amount: None Present (0%) Necrotic Amount: Large (67-100%) Necrotic  Quality: Adherent Slough Treatment Notes Wound #17 (Lower Leg) Wound Laterality: Left, Anterior Cleanser Soap and Water Discharge Instruction: Gently cleanse wound with antibacterial soap, rinse and pat dry prior to dressing wounds Wound Cleanser Discharge Instruction: Wash your hands with soap and water. Remove old dressing, discard into plastic bag and place into trash. Cleanse the wound with Wound Cleanser prior to applying a clean dressing using gauze sponges, not tissues or cotton balls. Do not scrub or use excessive force. Pat dry using gauze sponges, not tissue or cotton balls. DONAT, HUMBLE ([id number]) [id number] Page 15 of 18 Peri-Wound Care Topical Primary Dressing Silvercel Small 2x2 (in/in) Discharge Instruction: Apply Silvercel Small 2x2 (in/in) as instructed Secondary Dressing ABD Pad 5x9 (in/in) Discharge Instruction: Cover with ABD pad Secured With ACE WRAP - 2M ACE Elastic Bandage With VELCRO Brand Closure, 4 (in) Compression Wrap Compression Stockings Add-Ons Electronic Signature(s) Signed: 02/05/2023 5:16:05 PM By: Midge Aver MSN RN CNS WTA Entered By: Midge Aver on 02/03/2023 16:45:41 -------------------------------------------------------------------------------- Wound Assessment Details Patient Name: Date of Service: Chad [name] 02/03/2023 3:30 PM Medical Record Number: [id number] Patient Account Number: [id number] Date of Birth/Sex: Treating RN: [birth year] (62 y.o. [name] Primary Care Mikias Lanz: [name] Other Clinician: Referring Hiroyuki Ozanich: Treating Orene Abbasi/Extender: [name] Weeks in Treatment: 60 Wound Status Wound Number: 18 Primary Pressure Ulcer Etiology: Wound Location: Right, Medial T Second oe Wound Open Wounding Event: Pressure Injury Status: Date Acquired: 09/26/2022  Comorbid Anemia, Chronic Obstructive Pulmonary Disease (COPD), Coronary Weeks Of Treatment: 16 History:  Artery Disease, Peripheral Arterial Disease, Peripheral Venous Clustered Wound: No Disease, Hepatitis C, Osteoarthritis, Neuropathy, Received Chemotherapy Photos Wound Measurements Length: (cm) 1.3 Width: (cm) 1 Depth: (cm) 0.1 Area: (cm) 1.021 Sudberry, Shahid ([id number]) Volume: (cm) 0.102 % Reduction in Area: -85.6% % Reduction in Volume: -85.5% Epithelialization: None [id number] Page 16 of 18 Wound Description Classification: Category/Stage II Exudate Amount: Medium Exudate Type: Serosanguineous Exudate Color: red, brown Foul Odor After Cleansing: No Slough/Fibrino Yes Wound Bed Granulation Amount: None Present (0%) Necrotic Amount: Large (67-100%) Necrotic Quality: Adherent Slough Treatment Notes Wound #18 (Toe Second) Wound Laterality: Right, Medial Cleanser Soap and Water Discharge Instruction: Gently cleanse wound with antibacterial soap, rinse and pat dry prior to dressing wounds Wound Cleanser Discharge Instruction: Wash your hands with soap and water. Remove old dressing, discard into plastic bag and place into trash. Cleanse the wound with Wound Cleanser prior to applying a clean dressing using gauze sponges, not tissues or cotton balls. Do not scrub or use excessive force. Pat dry using gauze sponges, not tissue or cotton balls. Peri-Wound Care Topical Primary Dressing Silvercel Small 2x2 (in/in) Discharge Instruction: Apply Silvercel Small 2x2 (in/in) as instructed Secondary Dressing Coverlet Latex-Free Fabric Adhesive Dressings Discharge Instruction: Knuckle Secured With Compression Wrap Compression Stockings Add-Ons Electronic Signature(s) Signed: 02/05/2023 5:16:05 PM By: Midge Aver MSN RN CNS WTA Entered By: Midge Aver on 02/03/2023 16:46:04 -------------------------------------------------------------------------------- Wound Assessment Details Patient Name: Date of Service: Chad [name] 02/03/2023 3:30  PM Medical Record Number: [id number] Patient Account Number: [id number] Date of Birth/Sex: Treating RN: 04-04-1961 (62 y.o. [name] Primary Care Ladora Osterberg: [name] Other Clinician: Referring Nuala Chiles: Treating Jamin Humphries/Extender: [name] Weeks in Treatment: 60 Wound Status Wound Number: 19 Primary Venous Leg Ulcer Etiology: Wound Location: Left, Posterior Lower Leg Wound Open Wounding Event: Gradually Chad [name], Chad [name] ([id number]) [id number] Page 17 of 18 Wounding Event: Gradually Appeared Status: Date Acquired: 11/10/2022 Comorbid Anemia, Chronic Obstructive Pulmonary Disease (COPD), Coronary Weeks Of Treatment: 11 History: Artery Disease, Peripheral Arterial Disease, Peripheral Venous Clustered Wound: Yes Disease, Hepatitis C, Osteoarthritis, Neuropathy, Received Chemotherapy Photos Wound Measurements Length: (cm) 3 Width: (cm) 1.6 Depth: (cm) 0.1 Area: (cm) 3.77 Volume: (cm) 0.377 % Reduction in Area: -433.2% % Reduction in Volume: -431% Epithelialization: None Wound Description Classification: Full Thickness Without Exposed Support Structures Exudate Amount: Medium Exudate Type: Serous Exudate Color: amber Foul Odor After Cleansing: No Slough/Fibrino Yes Wound Bed Granulation Amount: None Present (0%) Necrotic Amount: Large (67-100%) Necrotic Quality: Adherent Slough Treatment Notes Wound #19 (Lower Leg) Wound Laterality: Left, Posterior Cleanser Soap and Water Discharge Instruction: Gently cleanse wound with antibacterial soap, rinse and pat dry prior to dressing wounds Wound Cleanser Discharge Instruction: Wash your hands with soap and water. Remove old dressing, discard into plastic bag and place into trash. Cleanse the wound with Wound Cleanser prior to applying a clean dressing using gauze sponges, not tissues or cotton balls. Do not scrub or use excessive force. Pat dry using gauze sponges, not  tissue or cotton balls. Peri-Wound Care Topical Primary Dressing Silvercel Small 2x2 (in/in) Discharge Instruction: Apply Silvercel Small 2x2 (in/in) as instructed Secondary Dressing ABD Pad 5x9 (in/in) Discharge Instruction: Cover with ABD pad Secured With ACE WRAP - 35M ACE Elastic Bandage With VELCRO Brand Closure, 4 (in) Compression Wrap Compression Stockings Add-Ons Electronic Signature(s) Signed: 02/05/2023 5:16:05 PM By: Midge Aver MSN RN CNS WTA Entered By: Midge Aver on  02/03/2023 16:46:26 [name] ([id number]) [id number] Page 18 of 18 -------------------------------------------------------------------------------- Vitals Details Patient Name: Date of Service: Chad [name], Chad [name] 02/03/2023 3:30 PM Medical Record Number: [id number] Patient Account Number: [id number] Date of Birth/Sex: Treating RN: 1961-02-01 (62 y.o. [name] Primary Care Cass Edinger: [name] Other Clinician: Referring Amee Boothe: Treating Alejandria Wessells/Extender: [name] Weeks in Treatment: 60 Vital Signs Time Taken: 16:06 Temperature (F): 98.3 Height (in): 69 Pulse (bpm): 101 Weight (lbs): 150 Respiratory Rate (breaths/min): 18 Body Mass Index (BMI): 22.1 Blood Pressure (mmHg): 137/87 Reference Range: 80 - 120 mg / dl Electronic Signature(s) Signed: 02/04/2023 12:01:20 PM By: Midge Aver MSN RN CNS WTA Previous Signature: 02/03/2023 4:38:50 PM Version By: Midge Aver MSN RN CNS WTA Entered By: Midge Aver on 02/04/2023 12:01:20

## 2023-02-12 ENCOUNTER — Encounter: Payer: Medicaid Other | Admitting: Physician Assistant

## 2023-02-12 DIAGNOSIS — I87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 hypertension (idiopathic) with ulcer and inflammation of left lower extremity: Secondary | ICD-10-CM | POA: Diagnosis not present

## 2023-02-12 NOTE — Progress Notes (Addendum)
[name], [name] ([id number]) [id number] Page 1 of 16 Visit Report for 02/12/2023 Chief Complaint Document Details Patient Name: Date of Service: Chad [name], Chad [name] 02/12/2023 9:15 A M Medical Record Number: [id number] Patient Account Number: [id number] Date of Birth/Sex: Treating RN: [birth year] (62 y.o. Chad [name] Primary Care Provider: [name] Other Clinician: Referring Provider: Treating Provider/Extender: [name] Weeks in Treatment: 33 Information Obtained from: Patient Chief Complaint Left ankle ulcer, Left dorsal foot wound, right heel Electronic Signature(s) Signed: 02/12/2023 9:00:34 AM By: [name] PA-C Entered By: [name] on 02/12/2023 09:00:34 -------------------------------------------------------------------------------- HPI Details Patient Name: Date of Service: Chad [name] 02/12/2023 9:15 A M Medical Record Number: [id number] Patient Account Number: [id number] Date of Birth/Sex: Treating RN: 11-25-1960 (62 y.o. Chad [name] Primary Care Provider: [name] Other Clinician: Referring Provider: Treating Provider/Extender: [name] Weeks in Treatment: 104 History of Present Illness HPI Description: 10/08/18 on evaluation today patient actually presents to our office for initial evaluation concerning wounds that he has of the bilateral lower extremities. 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 He also has bilateral venous stasis. He apparently has been homeless currently following discharge from the hospital apparently he has been placed at [facility] which is is a skilled nursing facility locally. Nonetheless fortunately he does not show any signs of infection at this time which is good news. In fact several of the wound  actually appears to be showing some signs of improvement already in my pinion. There are a couple areas in the left leg in particular there likely gonna require some sharp debridement to help clear away some necrotic tissue and help with more sufficient healing. No fevers, chills, nausea, or vomiting noted at this time. 10/15/18 on evaluation today patient actually appears to be doing very well in regard to his bilateral lower extremities. He's been tolerating the dressing changes without complication. Fortunately there does not appear to be any evidence of active infection at this time which is great news. Overall I'm actually very pleased with how this has progressed in just one visits time. Readmission: 08/14/2020 upon evaluation today patient presents for re-evaluation here in our clinic. He is having issues with his left ankle region as well as his right toe and his right heel. He tells me that the toe and heel actually began as a area that was itching that he was scratching and then subsequently opened up into wounds. These may have been abscess areas I presume based on what I am seeing currently. With regard to his left ankle region he tells me this was a similar type BRIGGSTON, STIGGERS ([id number]) [id number] Page 2 of 16 occurrence although he does have venous stasis this very well may be more of a venous leg ulcer more than anything. Nonetheless I do believe that the patient would benefit from appropriate and aggressive wound care to try to help get things under better control here. He does have history of a stroke on the left side affecting him to some degree there that he is able to stand although he does have some residual weakness. Otherwise again the patient does have chronic venous insufficiency as previously noted. His arterial studies most recently obtained showed that he had an ABI on the right of 1.16 with a TBI of 0.52 and on the left and ABI of 1.14 with  a  TBI of 0.81. That was obtained on 06/19/2020. 08/28/2020 upon evaluation today patient appears to be doing decently well in regard to his wounds in general. He has been tolerating the dressing changes without complication. Fortunately there does not appear to be any signs of active infection which is great news. With that being said I think the [facility] is doing a good job I would recommend that we likely continue with that currently. 09/11/2020 upon evaluation today patient's wounds did not appear to be doing too poorly but again he is not really showing signs of significant improvement with regard to any of the wounds on the right. None of them have Hydrofera Blue on them I am not exactly sure why this is not being followed as the facility did not contact us to let us know of any issues with obtaining dressings or otherwise. With that being said he is supposed to be using Hydrofera Blue on both of the wounds on the right foot as well as the ankle wound on the left side. 09/18/2020 upon evaluation today patient appears to be doing poorly with regard to his wounds. Again right now the left ankle in particular showed signs of extreme maceration. Apparently he was told by someone with staff at [facility] they could not get the [facility]. With that being said this is something that is never been relayed to [place] one way or another. Also the patient subsequently has not supposed to have a border gauze dressing on. He should have an ABD pad and roll gauze to secure as this drains much too much just to have a border gauze dressing to cover. Nonetheless the fact that they are not using the appropriate dressing is directly causing deterioration of the left ankle wound it is significantly worse today compared to what it was previous. I did attempt to call [facility] while the patient was here I called three times and got no one to even pick up the phone. After this I had my for an  office coordinator call and she was able to finally get through and leave a message with the D ON as of dictation of this note which is roughly about an hour and a half later I still have not been able to speak with anyone at the facility. 09/25/2020 upon evaluation today patient actually showing signs of good improvement which is excellent news. He has been tolerating the dressing changes without complication. Fortunately there is no signs of active infection which is great news. No fevers, chills, nausea, vomiting, or diarrhea. I do feel like the facility has been doing a much better job at taking care of him as far as the dressings are concerned. However the director of nursing never did call me back. 10/09/2020 upon evaluation today patient appears to be doing well with regard to his wound. The toe ulcer did require some debridement but the other 2 areas actually appear to be doing quite well. 10/19/2020 upon evaluation today patient actually appears to be doing very well in regard to his wounds. In fact the heel does appear to be completely healed. The toe is doing better in the medial ankle on the left is also doing better. Overall I think he is headed in the right direction. 10/26/2020 upon evaluation today patient appears to be doing well with regard to his wound. He is showing signs of improvement which is great news and overall I am very pleased with where things stand today. No  fevers, chills, nausea, vomiting, or diarrhea. 11/02/2020 upon evaluation today patient appears to be doing well with regard to his wounds. He has been tolerating the dressing changes without complication overall I am extremely pleased with where things stand today. He in regard to the toe is almost completely healed and the medial ankle on the left is doing much better. 11/09/2020 upon evaluation today patient appears to be doing a little poorly in regard to his left medial ankle ulcer. Fortunately there does not appear to  be any signs of systemic infection but unfortunately locally he does appear to be infected in fact he has blue-green drainage consistent with Pseudomonas. 11/16/2020 upon evaluation today patient appears to be doing well with regard to his wound. It actually appears to be doing better. I did place him on gentamicin cream since the Cipro was actually resistant even though he was positive for Pseudomonas on culture. Overall I think that he does seem to be doing better though I am unsure whether or not they have actually been putting the cream on. The patient is not sure that we did talk to the nurse directly and she was going to initiate that treatment. Fortunately there does not appear to be any signs of active infection at this time. No fevers, chills, nausea, vomiting, or diarrhea. 4/28; the area on the right second toe is close to healed. Left medial ankle required debridement 12/07/2020 upon evaluation today patient appears to be doing well with regard to his wounds. In fact the right second toe appears to be completely healed which is great news. Fortunately there does not appear to be any signs of active infection at this time which is also great news. I think we can probably discontinue the gentamicin on top of everything else. 12/14/2020 upon evaluation today patient appears to be doing well with regard to his wound. He is making good progress and overall very pleased with where things stand today. There is no signs of active infection at this time which is great news. 12/28/2020 upon evaluation today patient appears to be doing well with regard to his wounds. He has been tolerating the dressing changes without complication. Fortunately there is no signs of active infection at this time. No fevers, chills, nausea, vomiting, or diarrhea. 12/28/2020 upon evaluation today patient's wound bed actually showed signs of excellent improvement. He has great epithelization and granulation I do not see any  signs of infection overall I am extremely pleased with where things stand at this point. No fevers, chills, nausea, vomiting, or diarrhea. 01/11/2021 upon evaluation today patient appears to be doing well with regard to his wound on his leg. He has been tolerating the dressing changes without complication. Fortunately there does not appear to be any signs of active infection which is great news. No fevers, chills, nausea, vomiting, or diarrhea. 01/25/2021 upon evaluation today patient appears to be doing well with regard to his wound. He has been tolerating the dressing changes without complication. Fortunately the collagen seems to be doing a great job which is excellent news. No fevers, chills, nausea, vomiting, or diarrhea. 02/08/2021 upon evaluation today patient's wound is actually looking a little bit worse especially in the periwound compared to previous. Fortunately there does not appear to be any signs of infection which is great news with that being said he does have some irritation around the periphery of the wound which has me more concerned. He actually had a dressing on that had not been changed in 3  days. He also is supposed to have daily dressing changes. With regard to the dressing applied he had a silver alginate dressing and silver collagen is what is recommended and ordered. He also had no Desitin around the edges of the wound in the periwound region although that is on the order inspect to be done as well. In general I was very concerned I did contact [facility] actually spoke with Verlon Au who is the scheduling individual and subsequently she stated that she would pass the information to the D ON apparently the D ON was not available to talk to me when I call today. 02/18/2021 upon evaluation today patient's wound is actually showing signs of improvement. Fortunately there does not appear to be any evidence of infection which is great news overall I am extremely pleased with  where things stand today. No fevers, chills, nausea, vomiting, or diarrhea. 8/3; patient presents for 1 week follow-up. He has no issues or complaints today. He denies signs of infection. 03/11/2021 upon evaluation today patient appears to be doing well with regard to his wound. He does have a little bit of slough noted on the surface of the wound but fortunately there does not appear to be any signs of active infection at this time. No fevers, chills, nausea, vomiting, or diarrhea. 03/18/2021 upon evaluation today patient appears to be doing well with regard to his wound. He has been tolerating the dressing changes without complication. There was a little irritation more proximal to where the wound was that was not noted last week but nonetheless this is very superficial just seems to be more irritation we just need to make sure to put a good amount of the zinc over the area in my opinion. Otherwise he does not seem to be doing significantly worse at all which is great news. 03/25/2021 upon evaluation today patient appears to be doing well with regard to his wound. He is going require some sharp debridement today to clear with YOSHIAKI, HEWLETT ([id number]) [id number] Page 3 of 16 some of the necrotic debris. I did perform this today without complication postdebridement wound bed appears to be doing much better this is great news. 04/08/2021 upon evaluation today patient appears to be doing decently well in regard to his wound although the overall measurement is not significantly smaller compared to previous. It is gone down a little bit but still the facility continues to not really put the appropriate dressings in place in fact he was supposed to have collagen we think he probably had more of an allergy to At this point. Fortunately there does not appear to be any signs of active infection systemically though locally I do not see anything on initial visualization either as  far as erythema or warmth. 04/15/2021 upon evaluation today patient appears to be doing well with regard to his wound. He is actually showing signs of improvement. I did place him on antibiotics last week, Cipro. He has been taking that 2 times a day and seems to be tolerating it very well. I do not see any evidence of worsening and in fact the overall appearance of the wound is smaller today which is also great news. 9/26; left medial ankle chronic venous insufficiency wound is improved. Using Hydrofera Blue 10/10; left medial ankle chronic venous insufficiency. Wound has not changed much in appearance completely nonviable surface. Apparently there have been problems getting the right product on the wound at the facility although he came in with [facility] on today  05/14/2021 upon evaluation today patient appears to be doing well with regard to his wound. I think he is making progress here which is good news. Fortunately there does not appear to be any signs of active infection at this time. No fevers, chills, nausea, vomiting, or diarrhea. 05/20/2021 upon evaluation today patient appears to be doing well with regard to his wound. He is showing signs of good improvement which is great news. There does not appear to be any evidence of active infection which is also excellent news. No fevers, chills, nausea, vomiting, or diarrhea. 05/28/2021 upon evaluation today patient appears to be doing quite well. There does not appear to be any signs of active infection at this time which is great news. Overall I am extremely pleased with where things stand today. I think he is headed in the right direction. 06/11/2021 upon evaluation today patient appears to be doing well with regard to his left ankle ulcer and poorly in regard to the toe ulcer on the second toe right foot. This appears to show signs of joint exposure. Apparently this has been present for 1 to 2 months although he kept forgetting to tell me  about it. That is unfortunate as right now it definitely appears to be doing significantly worse than what I would like to see. There does not appear to be any signs of active infection systemically though locally I am concerned about the possibility of infection the toe is quite red. Again no one from the facility ever contacted [place] to advise that this was going on in the interim either. 06/17/2021 upon evaluation today patient presents for follow-up I did review his x-ray which showed a navicular bone fracture I am unsure of the chronicity of this. Subsequently he also had osteomyelitis of the toe which was what I was more concerned about this did not show up on x-ray but did show up on the pathology scrapings. This was listed as acute osteomyelitis. Nonetheless at this point I think that the antibiotic treatment is the best regimen to go with currently. The patient is in agreement with that plan. Nonetheless he has initially 30 days of doxycycline off likely extend that towards the end of the treatment cycle that will be around the middle of December for an additional 2 weeks. That all depends on how well he continues to heal. Nonetheless based on what I am seeing in the foot I did want a proceed with an MRI as well which I think will be helpful to identify if there is anything else that needs to be addressed from the standpoint of infection. 06/24/2021 upon evaluation today patient appears to be doing pretty well in regards to his wounds. I think both are actually showing signs of improvement which is good I did review his MRI today which did show signs of osteomyelitis of the middle and proximal phalanx on his right foot of the affected toe. With that being said this is actually showing signs of significant improvement today already with the antibiotic therapy I think the redness is also improved. Overall I think that we just need to give this some time with appropriate wound care we will see how  things go potentially hyperbarics could be considered. 07/02/2021 upon inspection today patient actually appears to be doing well in regard to his left ankle which is getting very close to complete resolution of pleased in that regard. Unfortunately he is continuing to have issues with his second toe right foot and this seems  to still be very painful for him. Recommend he try something different from the standpoint of antibiotics. 07/15/2021 upon evaluation today patient appears to be doing actually pretty well in regard to his foot. This is actually showing signs of significant improvement which is great news. Overall I feel like the patient is improving both in regard to the second toe as well as the ankle on the left. With that being said the biggest issue that I do see currently is that he is needing to have a refill of the doxycycline that we previously treated him with. He also did see podiatry they are not going to recommend any amputation at this point since he seems to be doing quite well. For that reason we just need to keep things under control from an infection standpoint. 08/01/2021 upon evaluation today patient appears to be doing well with regard to his wound. He has been tolerating the dressing changes without complication. Fortunately there does not appear to be any evidence of active infection locally nor systemically at this point. In fact I think everything is doing excellent in fact his second toe on the right foot is almost healed and the ankle on the left ankle region is actually very close to being healed as well. 08/08/2021 upon evaluation today patient appears to be doing well with regard to his wound. He has been tolerating the dressing changes without complication. Fortunately I do not see any signs of active infection at this time. Readmission: 12-06-2021 upon evaluation today patient presents for reevaluation here in the clinic he does tell me that he was being seen in  facility at [facility] by a provider that was coming in. He is not sure who this was. He tells me however that the wound seems to have gotten worse even compared to where it was when we last saw him at this point. With that being said I do believe that he is likely going need ongoing wound care here in the clinic and I do believe that we need to be the ones to frontline this since his wound does seem to be getting worse not better at this point. He voiced understanding. He is also in agreement with this plan and feels more comfortable coming here she tells me. Patient's medical history really has not changed since his prior admission he was only gone since January. 12-27-2021 upon evaluation today patient appears to be doing well with regard to his wound they did run out of the [facility] so they did not put anything on just an ABD pad with gentamicin. Still we are seeing some signs of good improvement here with some new epithelization which is great news. 01-10-2022 upon evaluation today patient appears to be doing well with regard to his wounds and he is going require some sharp debridement but overall seems to be making good progress. Fortunately I do not see any evidence of active infection locally or systemically at this time which is great news. 01-24-2022 upon evaluation today patient appears to be doing well with regard to his wound. The facility actually came and dropped him off early and he had another appointment at the hospital and then they just brought him over here and this was still hours before his appointment this afternoon. For that reason we did do our best to work him in this morning and fortunately had some space to make this happen. With that being said patient's wound does seem to be making progress here and I  am very pleased in that regard I do not see any signs of active infection locally or systemically at this time. 02-07-2022 patient appears to be doing well  currently in regard to his wounds. In fact one of them the more proximal is healed the distal is still open but seems to be doing excellent. Fortunately I do not see any evidence of active infection locally or systemically at this time which is great news. No fevers, chills, nausea, vomiting, or diarrhea. 7/27; left medial ankle venous. Improving per our intake nurse. We are using Hydrofera Blue under Tubigrip compression. They are changing that at his facility 03-06-2022 upon evaluation today patient appears to be doing well with regard to the his wound he is can require some sharp debridement but seems to be making excellent progress. Fortunately I do not see any evidence of active infection locally or systemically which is great news. 03-27-2022 upon evaluation today patient's wound is actually showing signs of excellent improvement. Fortunately I see no signs of active infection locally or systemically at this time which is great news. No fevers, chills, nausea, vomiting, or diarrhea. DAMAREE, Chad [name] ([id number]) [id number] Page 4 of 16 04-21-2022 upon evaluation today patient appears to be doing excellent in regard to his wound in fact this is very close to resolution based on what I am seeing. I do not see any evidence of active infection locally or systemically at this time which is great news and overall I am extremely pleased with where we are today. 05-05-2022 upon evaluation today patient's wound actually appears to potentially be completely healed. Fortunately I do not see any evidence of active infection at this time which is great news and overall I am very pleased I think this needs a little time to toughen up but other than that I really do believe were doing quite well. He is very pleased to hear this its been a long time coming. 05-19-2022 upon evaluation today patient appears to be doing well currently in regard to his wound. He in fact we were hoping will  be completely healed and closed but it still has a very tiny area right in the center which is still continuing to drain. Fortunately I do not see any signs of infection locally or systemically which is great news. 11/6; this wound is close to completely" he comes in this week with some erythema at 1 edge of this which looks like something was rubbing on the area. Other than that no evidence of infection 06-16-2022 upon evaluation today patient appears to be doing well currently in regard to his ankle ulcer. This is very close to being healed but still has a small area in the central portion which is not. Fortunately there does not appear to be any signs of infection locally or systemically which is great news. No fevers, chills, nausea, vomiting, or diarrhea. 06-30-2022 upon evaluation patient's wound pretty much appears to be almost completely closed. In fact it may even be closed but there are still a lot of skin irritation around the edges of the wound and I just do not feel great about releasing them yet I would like to continue to monitor this just a little bit longer before getting to that point. He is not opposed to this and in fact is happy to continue to come in if need be in order to make sure that things are moving in the right direction he definitely does not want to backtrack. 07-14-2022 upon evaluation today patient  appears to be doing well currently in regard to his wound. Has been tolerating the dressing changes without complication. Fortunately I see no evidence of active infection locally nor systemically which is great news. No fevers, chills, nausea, vomiting, or diarrhea. 08-11-2022 upon evaluation today patient appears to be doing a little worse than last time I saw him although it has been a month. Unfortunately he was not able to be seen due to the fact that he was quarantined in the facility with COVID. Subsequently he tells me that he mention to the facility staff several  times about taking it off over the past several weeks but they continue to tell him that someone have to get in touch with [place] here although nobody ever did until Friday when the nurse manager called to have that documented in the communication notes. With that being said unfortunately this means that he had a wrap on that he was supposed to have on for only 1 week for ended up being on four 1 month essentially. I am glad that there is nothing worse than what we see currently to be perfectly honest. 08-19-2022 upon evaluation today patient appears to be doing well currently in regard to his wounds. He has been tolerating the dressing changes without complication. This is actually smaller today which is great news after last week's reopening. Fortunately I do not see any evidence of infection locally nor systemically at this point. 08-26-2022 upon evaluation today patient appears to be doing well currently in regard to his wound. He has been tolerating the dressing changes without complication. Fortunately there does not appear to be any signs of active infection locally nor systemically which is great news. No fevers, chills, nausea, vomiting, or diarrhea. 09-02-2022 upon evaluation today patient's wound actually showing signs of excellent improvement this is measuring smaller and looking much better. I am very pleased with where we stand I do believe that we are headed in the right direction. 09-09-2022 upon evaluation today patient appears to be doing decently well in regard to his leg in general although there was some swelling this is the 1 thing that I am not too happy with based on what I see currently. Fortunately there does not appear to be any signs of infection locally nor systemically at this point. 09-16-2022 upon evaluation today patient appears to be doing well currently in regard to his wound. He is actually showing signs of improvement he wore the Tubigrip this week and it significantly  better compared to last week's evaluation. Fortunately I do not see any evidence of active infection locally or systemically which is great news. Overall I think that were headed in the right direction which is great news. In fact overall I think this is the best that he has looked in some time. He does not like the Tubigrip but actually did extremely well with this in my opinion. I think he needs to continue to utilize this. 09-23-2022 upon evaluation today patient appears to be doing well currently in regard to his wound. He has been tolerating the dressing changes without complication. Fortunately there does not appear to be any signs of active infection locally nor systemically which is great news and in general I do feel like that we are headed in the right direction. He has been having trouble with the Tubigrip however we have gotten the facility to order compression socks he has not gotten them as of yet. 3/4; patient is about the same with a wound  on the left posterior leg. This is a venous wound. He needs more compression on this leg but he took off the previous wraps we are using. I am not certain whether he is using juxta lite stockings at the nursing home. 3/12; patient presents for follow-up. He unfortunately has 2 new wounds 1 to the dorsal left side and the other to the right heel. He is not sure how these happened. The right heel appears to be caused by pressure. He has been using Hydrofera Blue to the original wound on the left leg. 10-14-2022 upon evaluation today patient appears to be doing well currently in regard to his original wound but unfortunately he has several new wounds which I was completely unaware of before walking into the room to see him today. Unfortunately he seems to not be doing nearly as well as what I saw when I last had an appointment with him 3 weeks ago. Fortunately I do not see any signs of systemic infection though unfortunately locally he has definite  infection of the left lower extremity. He is currently on doxycycline I am going to likely have to extend that today. 10-21-2022 upon evaluation today patient appears to be doing well currently in regard to his wounds. He seems to be tolerating the dressing changes without complication. Fortunately there does not appear to be any signs of active infection locally or systemically which is great news and in general I definitely feel like we are on the right track here. 10-30-2022 upon evaluation today patient still continues to not be doing too well at all with regard to his wounds. Unfortunately he is still having signs of infection as well which also has been concerned. Fortunately I do not see any signs of active infection locally nor systemically at this time. No fevers, chills, nausea, vomiting, or diarrhea. 11-06-2022 upon evaluation today patient appears to be doing okay currently in regard to his wounds. All things considered with his blood flow not being nearly as good as what should I think that he is making pretty good progress here. Fortunately I do not see any signs of infection locally nor systemically which is great news. No fevers, chills, nausea, vomiting, or diarrhea. 11-13-2022 upon evaluation today patient appears to be doing poorly currently in regard to his wounds. Things seem to be getting a little bit worse not better. 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 Subsequently he is also going to need to have vascular intervention but we are unsure exactly when this is going on the undertaking. I would try to actually send a message to Dr. Hazle Quant and the vascular team just to see what exactly the plan is going forward. The patient is in agreement with that plan I just think we need to have a plan especially in light of the fact the left leg seems to be getting a little bit worse at this  point. 11-25-2022 upon evaluation today patient is currently now discharged from the hospital following his colostomy procedure. That did very well and he seems to [facility], [name] ([id number]) [id number] Page 5 of 16 be doing well. Fortunately there does not appear to be any signs of active infection locally or systemically which is great news. No fevers, chills, nausea, vomiting, or diarrhea. 12-04-2022 upon evaluation today patient appears to be doing actually decently well in regard to his wounds and actually very pleased  with where we stand and I think that he is making some pretty good progress here. Fortunately I do not see any signs of active infection locally nor systemically which is great news. No fevers, chills, nausea, vomiting, or diarrhea. 12-12-2022 upon evaluation today patient appears to be doing well currently in regard to his wound. He has been tolerating the dressing changes without complication. The wounds all seem to be making some progress to a degree. He has appointment with vascular on the 23rd of this month. 5/24; the patient was scheduled for a left lower extremity angiogram yesterday but apparently they were overbooked and they wanted to admit him to hospital last night so they could do this early this morning but he refused I see that Dr. Wyn Quaker has rescheduled him for June 6. He is using silver alginate to wounds on his bilateral lower extremities. 01-02-2023 upon evaluation today patient appears to be doing poorly in regard to his wounds still. Subsequently he did have his angiogram but apparently there really was not much that was found that could be revascularized. The patient tells me that Dr. Wyn Quaker was going to contact me here in the office I have not heard from him as of yet and I may see about giving him a call just to follow-up or possibly send a message through epic which could be an option as well. 01-09-2023 upon evaluation today patient  appears to be doing poorly in regard to his legs he is extremely swollen. He has been sleeping in his chair he tells me for the past week. His bed is broken and they have ordered a new one which is supposedly supposed to be there today. With that being said he the way he needs to be not sleeping in his chair that is his wheelchair every day and through the night. This is causing his legs to be much more swollen and it seen and how badly wounds are doing at this point. This has made things significantly worse. Fortunately I do not see any signs of infection right now unfortunately I do believe that the patient has a tremendous amount of drainage currently. 01-19-2023 upon evaluation today patient appears to be doing poorly in regard to his wounds of his legs his swelling is better but still not as good as what we would like to see. Fortunately I do not see any evidence of active infection locally nor systemically which is great news and in general I do believe that we are moving in the right direction here. Nonetheless it would be better if she was in the bed sleeping or not send up in his wheelchair although he seems to still be set up in the wheelchair at this time. He tells me that he is just not as comfortable in the bed as the wheelchair but unfortunately this is causing damage to his legs which I discussed with him today as well. Nonetheless I am not sure that he is going to be amendable to doing anything different. 01-27-2023 upon evaluation today patient appears to be doing well currently. Fortunately I do not see any evidence of active infection locally nor systemically which is great news I think he is actually doing the best option and while in fact I would try to see if we can do some debridement today to get things moving along. 02-03-2023 upon evaluation today patient's wounds unfortunately are not doing nearly as well. Will be doing the [employer] wraps unfortunately we just not seeing the  improvement that I would like to see. Fortunately I do not see any evidence of infection at this time which is good news unfortunately I do think that he needs to be changed on a daily basis due to the amount of drainage otherwise I do not think this is really going to continue to improve dramatically. 02-12-2023 upon evaluation today patient appears to be doing better in a lot of ways in regard to his wounds and although his toes on the left side actually seem a bit worse. Fortunately I do not see any evidence of active infection locally nor systemically at this time which is good news. With that being said he is having some bleeding from the left leg I think that this is an area that looks like it probably got bumped. With that being said I discussed this with the patient he tells me he does not know of any trauma that occurred but at the same time if was not this that it was skin that got pulled off. Dressing change there is only one of the 2 that could have happened. Electronic Signature(s) Signed: 02/12/2023 11:21:56 AM By: [name] PA-C Entered By: [name] on 02/12/2023 11:21:56 -------------------------------------------------------------------------------- Gaynelle Adu TISS Details Patient Name: Date of Service: KENE, WOOLDRIDGE [name] 02/12/2023 9:15 A M Medical Record Number: [id number] Patient Account Number: [id number] Date of Birth/Sex: Treating RN: 1960-10-21 (62 y.o. Arthur Holms Primary Care Provider: [name] Other Clinician: Referring Provider: Treating Provider/Extender: [name] Weeks in Treatment: 52 Procedure Performed for: Wound #17 Left,Anterior Lower Leg Performed By: Physician [name], PA-C Post Procedure Diagnosis Same as Pre-procedure Notes 1 silver nitrate stick used Electronic Signature(s) MEDHANSH, SPANGLER ([id number]) [id number] Page 6 of 16 Signed: 03/11/2023 9:12:26 AM By: Elliot Gurney, BSN, RN, CWS, Kim  RN, BSN Previous Signature: 02/12/2023 4:23:38 PM Version By: Midge Aver MSN RN CNS WTA Entered By: Elliot Gurney, BSN, RN, CWS, Kim on 03/11/2023 02:72:53 -------------------------------------------------------------------------------- Physical Exam Details Patient Name: Date of Service: SEANMICHAEL, HATCH 02/12/2023 9:15 A M Medical Record Number: [id number] Patient Account Number: [id number] Date of Birth/Sex: Treating RN: 1961-07-26 (62 y.o. Chad [name] Primary Care Provider: [name] Other Clinician: Referring Provider: Treating Provider/Extender: [name] Weeks in Treatment: 30 Constitutional Well-nourished and well-hydrated in no acute distress. Respiratory normal breathing without difficulty. Psychiatric this patient is able to make decisions and demonstrates good insight into disease process. Alert and Oriented x 3. pleasant and cooperative. Notes Upon inspection patient's wound bed actually showed signs of good granulation epithelization at this point. Fortunately I do not see any signs of worsening overall and in general I do believe that the patient is making headway towards closure although his left foot still has a pretty rough shape we will need to try to put some silver alginate between toes to help and dry this up. Electronic Signature(s) Signed: 02/12/2023 11:22:17 AM By: [name] PA-C Entered By: [name] on 02/12/2023 11:22:17 -------------------------------------------------------------------------------- Physician Orders Details Patient Name: Date of Service: CHISHOLM, CONARY 02/12/2023 9:15 A M Medical Record Number: [id number] Patient Account Number: [id number] Date of Birth/Sex: Treating RN: 02-25-1961 (62 y.o. 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 Nurse Visit as needed Bathing/ Shower/ Hygiene May shower with wound dressing protected with water repellent cover or cast protector. No tub bath. Anesthetic (Use 'Patient Medications' Section for Anesthetic Order Entry) Lidocaine applied to wound bed Edema Control - Lymphedema / Segmental Compressive Device / Other Elevate legs to the level of the heart and pump ankles as often as possible Elevate leg(s) parallel to the floor when sitting. Other: - PATIENT MUST SLEEP IN A BED. Legs are swelling due to no elevation of legs. Off-Loading Heel suspension boot - when in bed Medications-Please add to medication list. Other: - apply AandD ointment to leg, do not apply to wound Wound Treatment Wound #12 - Ankle Wound Laterality: Left, Medial, Distal Cleanser: Soap and Water 1 x Per Day/30 Days Discharge Instructions: Gently cleanse wound with antibacterial soap, rinse and pat dry prior to dressing wounds Cleanser: Wound Cleanser 1 x Per Day/30 Days Discharge Instructions: Wash your hands with soap and water. Remove old dressing, discard into plastic bag and place into trash. Cleanse the wound with Wound Cleanser prior to applying a clean dressing using gauze sponges, not tissues or cotton balls. Do not scrub or use excessive force. Pat dry using gauze sponges, not tissue or cotton balls. Prim Dressing: Silvercel Small 2x2 (in/in) 1 x Per Day/30  Days ary Discharge Instructions: Apply Silvercel Small 2x2 (in/in) as instructed Secondary Dressing: ABD Pad 5x9 (in/in) 1 x Per Day/30 Days Discharge Instructions: Cover with ABD pad Secured With: ACE WRAP - 580M ACE Elastic Bandage With VELCRO Brand Closure, 4 (in) 1 x Per Day/30 Days Wound #15 - Foot Wound Laterality: Dorsal, Left Cleanser: Soap and Water 1 x Per Day/30 Days Discharge Instructions: Gently cleanse wound with antibacterial soap, rinse and pat dry prior to dressing wounds Cleanser: Wound Cleanser 1 x Per Day/30 Days Discharge Instructions: Wash your hands with soap and water. Remove old dressing, discard into plastic bag and place into trash. Cleanse the wound with Wound Cleanser prior to applying a clean dressing using gauze sponges, not tissues or cotton balls. Do not scrub or use excessive force. Pat dry using gauze sponges, not tissue or cotton balls. Prim Dressing: Silvercel Small 2x2 (in/in) 1 x Per Day/30 Days ary Discharge Instructions: Place in between the toes and on the foot Secondary Dressing: ABD Pad 5x9 (in/in) 1 x Per Day/30 Days Discharge Instructions: Cover with ABD pad Secured With: ACE WRAP - 580M ACE Elastic Bandage With VELCRO Brand Closure, 4 (in) 1 x Per Day/30 Days Wound #16 - Calcaneus Wound Laterality: Right Cleanser: Soap and Water 1 x Per Day/30 Days Discharge Instructions: Gently cleanse wound with antibacterial soap, rinse and pat dry prior to dressing wounds Cleanser: Wound Cleanser 1 x Per Day/30 Days Discharge Instructions: Wash your hands with soap and water. Remove old dressing, discard into plastic bag and place into trash. Cleanse the wound with Wound Cleanser prior to applying a clean dressing using gauze sponges, not tissues or cotton balls. Do not scrub or use excessive force. Pat dry using gauze sponges, not tissue or cotton balls. Prim Dressing: Silvercel Small 2x2 (in/in) 1 x Per Day/30 Days ary Discharge Instructions: Apply  Silvercel Small 2x2 (in/in) as instructed Secondary Dressing: ABD Pad 5x9 (  in/in) 1 x Per Day/30 Days Discharge Instructions: Cover with ABD pad Secured With: ACE WRAP - 66M ACE Elastic Bandage With VELCRO Brand Closure, 4 (in) 1 x Per Day/30 Days JAYVIAN, PESTA ([id number]) [phone].pdf Page 8 of 16 Secured With: Heel cup 1 x Per Day/30 Days Discharge Instructions: May reuse over ABD Wound #17 - Lower Leg Wound Laterality: Left, Anterior Cleanser: Soap and Water 1 x Per Day/30 Days Discharge Instructions: Gently cleanse wound with antibacterial soap, rinse and pat dry prior to dressing wounds Cleanser: Wound Cleanser 1 x Per Day/30 Days Discharge Instructions: Wash your hands with soap and water. Remove old dressing, discard into plastic bag and place into trash. Cleanse the wound with Wound Cleanser prior to applying a clean dressing using gauze sponges, not tissues or cotton balls. Do not scrub or use excessive force. Pat dry using gauze sponges, not tissue or cotton balls. Prim Dressing: Silvercel Small 2x2 (in/in) 1 x Per Day/30 Days ary Discharge Instructions: Apply Silvercel Small 2x2 (in/in) as instructed Secondary Dressing: ABD Pad 5x9 (in/in) 1 x Per Day/30 Days Discharge Instructions: Cover with ABD pad Secured With: ACE WRAP - 66M ACE Elastic Bandage With VELCRO Brand Closure, 4 (in) 1 x Per Day/30 Days Wound #18 - T Second oe Wound Laterality: Right, Medial Cleanser: Soap and Water 1 x Per Day/30 Days Discharge Instructions: Gently cleanse wound with antibacterial soap, rinse and pat dry prior to dressing wounds Cleanser: Wound Cleanser 1 x Per Day/30 Days Discharge Instructions: Wash your hands with soap and water. Remove old dressing, discard into plastic bag and place into trash. Cleanse the wound with Wound Cleanser prior to applying a clean dressing using gauze sponges, not tissues or cotton balls. Do not scrub or use excessive force. Pat dry  using gauze sponges, not tissue or cotton balls. Prim Dressing: Silvercel Small 2x2 (in/in) 1 x Per Day/30 Days ary Discharge Instructions: Apply Silvercel Small 2x2 (in/in) as instructed Secondary Dressing: Coverlet Latex-Free Fabric Adhesive Dressings 1 x Per Day/30 Days Discharge Instructions: Knuckle Wound #19 - Lower Leg Wound Laterality: Left, Posterior Cleanser: Soap and Water 1 x Per Day/30 Days Discharge Instructions: Gently cleanse wound with antibacterial soap, rinse and pat dry prior to dressing wounds Cleanser: Wound Cleanser 1 x Per Day/30 Days Discharge Instructions: Wash your hands with soap and water. Remove old dressing, discard into plastic bag and place into trash. Cleanse the wound with Wound Cleanser prior to applying a clean dressing using gauze sponges, not tissues or cotton balls. Do not scrub or use excessive force. Pat dry using gauze sponges, not tissue or cotton balls. Prim Dressing: Silvercel Small 2x2 (in/in) 1 x Per Day/30 Days ary Discharge Instructions: Apply Silvercel Small 2x2 (in/in) as instructed Secondary Dressing: ABD Pad 5x9 (in/in) 1 x Per Day/30 Days Discharge Instructions: Cover with ABD pad Secured With: ACE WRAP - 66M ACE Elastic Bandage With VELCRO Brand Closure, 4 (in) 1 x Per Day/30 Days Electronic Signature(s) Signed: 02/12/2023 4:23:38 PM By: Midge Aver MSN RN CNS WTA Signed: 02/23/2023 3:00:23 PM By: [name] PA-C Entered By: Midge Aver on 02/12/2023 13:53:50 Problem List Details -------------------------------------------------------------------------------- [name] ([id number]) [id number] Page 9 of 16 Patient Name: Date of Service: MARSHAWN, CLINE 02/12/2023 9:15 A M Medical Record Number: [id number] Patient Account Number: [id number] Date of Birth/Sex: Treating RN: [birth year] (62 y.o. 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 Chad [name] Primary Care Provider: [name] Other Clinician: Referring Provider: Treating Provider/Extender: [name] Weeks in Treatment: 71 Subjective Chief Complaint Information obtained from Patient Left ankle ulcer, Left dorsal foot wound, right heel History of Present Illness (HPI) 10/08/18 on evaluation today patient actually presents to our office for initial evaluation concerning wounds that he has of the bilateral lower extremities. 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 He also has bilateral venous stasis. He apparently has been homeless currently  following discharge from the hospital apparently he has been placed at [facility] which is is a skilled nursing facility locally. Nonetheless fortunately he does not show any signs of infection at this time which is good news. In fact several of the wound actually appears to be showing some signs of improvement already in my pinion. There are a couple areas in the left leg in particular there likely gonna require some sharp debridement to help clear away some necrotic tissue and help with more sufficient healing. No fevers, chills, nausea, or vomiting noted at this time. 10/15/18 on evaluation today patient actually appears to be doing very well in regard to his bilateral lower extremities. He's been tolerating the dressing changes without complication. Fortunately there does not appear to be any evidence of active infection at this time which is great news. Overall I'm actually very pleased with how this has progressed in just one visits time. Readmission: 08/14/2020 upon evaluation today patient presents for re-evaluation here in our clinic. He is having issues with his left ankle region as well as his right toe and his right heel. He tells me that the toe and heel actually began as a area that was itching that he was scratching and then subsequently opened up into wounds. These may have been abscess areas I presume based on what I am seeing currently. With regard to his left ankle region he tells me this was a similar type occurrence although he does have venous stasis this very well may be more of a venous leg ulcer more than anything. Nonetheless I do believe that the patient would benefit from appropriate and aggressive wound care to try to help get things under better control here. He does have history of a stroke on the left side affecting him to some degree there that he is able to stand although he does have some residual weakness. Otherwise again the patient does have chronic venous  insufficiency as previously noted. His arterial studies most recently obtained showed that he had an  ABI on the right of 1.16 with a TBI of 0.52 and on the left and ABI of 1.14 with a TBI of 0.81. That was obtained on 06/19/2020. 08/28/2020 upon evaluation today patient appears to be doing decently well in regard to his wounds in general. He has been tolerating the dressing changes without complication. Fortunately there does not appear to be any signs of active infection which is great news. With that being said I think the [facility] is doing a good job I would recommend that we likely continue with that currently. 09/11/2020 upon evaluation today patient's wounds did not appear to be doing too poorly but again he is not really showing signs of significant improvement with regard to any of the wounds on the right. None of them have Hydrofera Blue on them I am not exactly sure why this is not being followed as the facility did not contact us to let us know of any issues with obtaining dressings or otherwise. With that being said he is supposed to be using Hydrofera Blue on both of the wounds on the right foot as well as the ankle wound on the left side. 09/18/2020 upon evaluation today patient appears to be doing poorly with regard to his wounds. Again right now the left ankle in particular showed signs of extreme maceration. Apparently he was told by someone with staff at [facility] they could not get the [facility]. With that being said this is something that is never been relayed to [place] one way or another. Also the patient subsequently has not supposed to have a border gauze dressing on. He should have an ABD pad and roll gauze to secure as this drains much too much just to have a border gauze dressing to cover. Nonetheless the fact that they are not using the appropriate dressing is directly causing deterioration of the left ankle wound it is significantly worse today compared to what  it was previous. I did attempt to call [facility] while the patient was here I called three times and got no one to even pick up the phone. After this I had my for an office coordinator call and she was able to finally get through and leave a message with the D ON as of dictation of this note which is roughly about an hour and a half later I still have not been able to speak with anyone at the facility. 09/25/2020 upon evaluation today patient actually showing signs of good improvement which is excellent news. He has been tolerating the dressing changes without complication. Fortunately there is no signs of active infection which is great news. No fevers, chills, nausea, vomiting, or diarrhea. I do feel like the facility has been doing a much better job at taking care of him as far as the dressings are concerned. However the director of nursing never did call me back. 10/09/2020 upon evaluation today patient appears to be doing well with regard to his wound. The toe ulcer did require some debridement but the other 2 areas actually appear to be doing quite well. 10/19/2020 upon evaluation today patient actually appears to be doing very well in regard to his wounds. In fact the heel does appear to be completely healed. The toe is doing better in the medial ankle on the left is also doing better. Overall I think he is headed in the right direction. 10/26/2020 upon evaluation today patient appears to be doing well with regard to his wound. He is showing  signs of improvement which is great news and overall I am very pleased with where things stand today. No fevers, chills, nausea, vomiting, or diarrhea. 11/02/2020 upon evaluation today patient appears to be doing well with regard to his wounds. He has been tolerating the dressing changes without complication overall I am extremely pleased with where things stand today. He in regard to the toe is almost completely healed and the medial ankle on the left is  doing much better. 11/09/2020 upon evaluation today patient appears to be doing a little poorly in regard to his left medial ankle ulcer. Fortunately there does not appear to be any signs of systemic infection but unfortunately locally he does appear to be infected in fact he has blue-green drainage consistent with Pseudomonas. 11/16/2020 upon evaluation today patient appears to be doing well with regard to his wound. It actually appears to be doing better. I did place him on gentamicin cream since the Cipro was actually resistant even though he was positive for Pseudomonas on culture. Overall I think that he does seem to be doing better though I am unsure whether or not they have actually been putting the cream on. The patient is not sure that we did talk to the nurse directly and she was going to initiate that treatment. Fortunately there does not appear to be any signs of active infection at this time. No fevers, chills, nausea, vomiting, or diarrhea. 4/28; the area on the right second toe is close to healed. Left medial ankle required debridement 12/07/2020 upon evaluation today patient appears to be doing well with regard to his wounds. In fact the right second toe appears to be completely healed which is great news. Fortunately there does not appear to be any signs of active infection at this time which is also great news. I think we can probably discontinue the gentamicin on top of everything else. 12/14/2020 upon evaluation today patient appears to be doing well with regard to his wound. He is making good progress and overall very pleased with where things stand today. There is no signs of active infection at this time which is great news. 12/28/2020 upon evaluation today patient appears to be doing well with regard to his wounds. He has been tolerating the dressing changes without complication. Fortunately there is no signs of active infection at this time. No fevers, chills, nausea, vomiting, or  diarrhea. 12/28/2020 upon evaluation today patient's wound bed actually showed signs of excellent improvement. He has great epithelization and granulation I do not see any signs of infection overall I am extremely pleased with where things stand at this point. No fevers, chills, nausea, vomiting, or diarrhea. 01/11/2021 upon evaluation today patient appears to be doing well with regard to his wound on his leg. He has been tolerating the dressing changes without complication. Fortunately there does not appear to be any signs of active infection which is great news. No fevers, chills, nausea, vomiting, or diarrhea. 01/25/2021 upon evaluation today patient appears to be doing well with regard to his wound. He has been tolerating the dressing changes without complication. Fortunately the collagen seems to be doing a great job which is excellent news. No fevers, chills, nausea, vomiting, or diarrhea. 02/08/2021 upon evaluation today patient's wound is actually looking a little bit worse especially in the periwound compared to previous. Fortunately there does not appear to be any signs of infection which is great news with that being said he does have some irritation around the periphery of the  wound which has me more concerned. He actually had a dressing on that had not been changed in 3 days. He also is supposed to have daily dressing changes. With regard to the dressing applied he had a silver alginate dressing and silver collagen is what is recommended and ordered. He also had no Desitin around the edges of the wound in the periwound region although that is on the order inspect to be done as well. In general I was very concerned I did contact [facility] actually spoke with Verlon Au who is the scheduling individual and subsequently she stated that she would pass the information to the D ON apparently the D ON HERMANN, SALYERS ([id number]) [id number] Page 11 of 16 was not  available to talk to me when I call today. 02/18/2021 upon evaluation today patient's wound is actually showing signs of improvement. Fortunately there does not appear to be any evidence of infection which is great news overall I am extremely pleased with where things stand today. No fevers, chills, nausea, vomiting, or diarrhea. 8/3; patient presents for 1 week follow-up. He has no issues or complaints today. He denies signs of infection. 03/11/2021 upon evaluation today patient appears to be doing well with regard to his wound. He does have a little bit of slough noted on the surface of the wound but fortunately there does not appear to be any signs of active infection at this time. No fevers, chills, nausea, vomiting, or diarrhea. 03/18/2021 upon evaluation today patient appears to be doing well with regard to his wound. He has been tolerating the dressing changes without complication. There was a little irritation more proximal to where the wound was that was not noted last week but nonetheless this is very superficial just seems to be more irritation we just need to make sure to put a good amount of the zinc over the area in my opinion. Otherwise he does not seem to be doing significantly worse at all which is great news. 03/25/2021 upon evaluation today patient appears to be doing well with regard to his wound. He is going require some sharp debridement today to clear with some of the necrotic debris. I did perform this today without complication postdebridement wound bed appears to be doing much better this is great news. 04/08/2021 upon evaluation today patient appears to be doing decently well in regard to his wound although the overall measurement is not significantly smaller compared to previous. It is gone down a little bit but still the facility continues to not really put the appropriate dressings in place in fact he was supposed to have collagen we think he probably had more of an allergy  to At this point. Fortunately there does not appear to be any signs of active infection systemically though locally I do not see anything on initial visualization either as far as erythema or warmth. 04/15/2021 upon evaluation today patient appears to be doing well with regard to his wound. He is actually showing signs of improvement. I did place him on antibiotics last week, Cipro. He has been taking that 2 times a day and seems to be tolerating it very well. I do not see any evidence of worsening and in fact the overall appearance of the wound is smaller today which is also great news. 9/26; left medial ankle chronic venous insufficiency wound is improved. Using Hydrofera Blue 10/10; left medial ankle chronic venous insufficiency. Wound has not changed much in appearance completely nonviable surface. Apparently there have been problems  getting the right product on the wound at the facility although he came in with [facility] on today 05/14/2021 upon evaluation today patient appears to be doing well with regard to his wound. I think he is making progress here which is good news. Fortunately there does not appear to be any signs of active infection at this time. No fevers, chills, nausea, vomiting, or diarrhea. 05/20/2021 upon evaluation today patient appears to be doing well with regard to his wound. He is showing signs of good improvement which is great news. There does not appear to be any evidence of active infection which is also excellent news. No fevers, chills, nausea, vomiting, or diarrhea. 05/28/2021 upon evaluation today patient appears to be doing quite well. There does not appear to be any signs of active infection at this time which is great news. Overall I am extremely pleased with where things stand today. I think he is headed in the right direction. 06/11/2021 upon evaluation today patient appears to be doing well with regard to his left ankle ulcer and poorly in regard to the toe  ulcer on the second toe right foot. This appears to show signs of joint exposure. Apparently this has been present for 1 to 2 months although he kept forgetting to tell me about it. That is unfortunate as right now it definitely appears to be doing significantly worse than what I would like to see. There does not appear to be any signs of active infection systemically though locally I am concerned about the possibility of infection the toe is quite red. Again no one from the facility ever contacted [place] to advise that this was going on in the interim either. 06/17/2021 upon evaluation today patient presents for follow-up I did review his x-ray which showed a navicular bone fracture I am unsure of the chronicity of this. Subsequently he also had osteomyelitis of the toe which was what I was more concerned about this did not show up on x-ray but did show up on the pathology scrapings. This was listed as acute osteomyelitis. Nonetheless at this point I think that the antibiotic treatment is the best regimen to go with currently. The patient is in agreement with that plan. Nonetheless he has initially 30 days of doxycycline off likely extend that towards the end of the treatment cycle that will be around the middle of December for an additional 2 weeks. That all depends on how well he continues to heal. Nonetheless based on what I am seeing in the foot I did want a proceed with an MRI as well which I think will be helpful to identify if there is anything else that needs to be addressed from the standpoint of infection. 06/24/2021 upon evaluation today patient appears to be doing pretty well in regards to his wounds. I think both are actually showing signs of improvement which is good I did review his MRI today which did show signs of osteomyelitis of the middle and proximal phalanx on his right foot of the affected toe. With that being said this is actually showing signs of significant improvement today  already with the antibiotic therapy I think the redness is also improved. Overall I think that we just need to give this some time with appropriate wound care we will see how things go potentially hyperbarics could be considered. 07/02/2021 upon inspection today patient actually appears to be doing well in regard to his left ankle which is getting very close to complete resolution of pleased  in that regard. Unfortunately he is continuing to have issues with his second toe right foot and this seems to still be very painful for him. Recommend he try something different from the standpoint of antibiotics. 07/15/2021 upon evaluation today patient appears to be doing actually pretty well in regard to his foot. This is actually showing signs of significant improvement which is great news. Overall I feel like the patient is improving both in regard to the second toe as well as the ankle on the left. With that being said the biggest issue that I do see currently is that he is needing to have a refill of the doxycycline that we previously treated him with. He also did see podiatry they are not going to recommend any amputation at this point since he seems to be doing quite well. For that reason we just need to keep things under control from an infection standpoint. 08/01/2021 upon evaluation today patient appears to be doing well with regard to his wound. He has been tolerating the dressing changes without complication. Fortunately there does not appear to be any evidence of active infection locally nor systemically at this point. In fact I think everything is doing excellent in fact his second toe on the right foot is almost healed and the ankle on the left ankle region is actually very close to being healed as well. 08/08/2021 upon evaluation today patient appears to be doing well with regard to his wound. He has been tolerating the dressing changes without complication. Fortunately I do not see any signs of  active infection at this time. Readmission: 12-06-2021 upon evaluation today patient presents for reevaluation here in the clinic he does tell me that he was being seen in facility at [facility] by a provider that was coming in. He is not sure who this was. He tells me however that the wound seems to have gotten worse even compared to where it was when we last saw him at this point. With that being said I do believe that he is likely going need ongoing wound care here in the clinic and I do believe that we need to be the ones to frontline this since his wound does seem to be getting worse not better at this point. He voiced understanding. He is also in agreement with this plan and feels more comfortable coming here she tells me. Patient's medical history really has not changed since his prior admission he was only gone since January. 12-27-2021 upon evaluation today patient appears to be doing well with regard to his wound they did run out of the [facility] so they did not put anything on just an ABD pad with gentamicin. Still we are seeing some signs of good improvement here with some new epithelization which is great news. 01-10-2022 upon evaluation today patient appears to be doing well with regard to his wounds and he is going require some sharp debridement but overall seems to be making good progress. Fortunately I do not see any evidence of active infection locally or systemically at this time which is great news. WILMA, DALIA ([id number]) [id number] Page 12 of 16 01-24-2022 upon evaluation today patient appears to be doing well with regard to his wound. The facility actually came and dropped him off early and he had another appointment at the hospital and then they just brought him over here and this was still hours before his appointment this afternoon. For that reason we did do our best to work him  in this morning and fortunately had some space to make  this happen. With that being said patient's wound does seem to be making progress here and I am very pleased in that regard I do not see any signs of active infection locally or systemically at this time. 02-07-2022 patient appears to be doing well currently in regard to his wounds. In fact one of them the more proximal is healed the distal is still open but seems to be doing excellent. Fortunately I do not see any evidence of active infection locally or systemically at this time which is great news. No fevers, chills, nausea, vomiting, or diarrhea. 7/27; left medial ankle venous. Improving per our intake nurse. We are using Hydrofera Blue under Tubigrip compression. They are changing that at his facility 03-06-2022 upon evaluation today patient appears to be doing well with regard to the his wound he is can require some sharp debridement but seems to be making excellent progress. Fortunately I do not see any evidence of active infection locally or systemically which is great news. 03-27-2022 upon evaluation today patient's wound is actually showing signs of excellent improvement. Fortunately I see no signs of active infection locally or systemically at this time which is great news. No fevers, chills, nausea, vomiting, or diarrhea. 04-21-2022 upon evaluation today patient appears to be doing excellent in regard to his wound in fact this is very close to resolution based on what I am seeing. I do not see any evidence of active infection locally or systemically at this time which is great news and overall I am extremely pleased with where we are today. 05-05-2022 upon evaluation today patient's wound actually appears to potentially be completely healed. Fortunately I do not see any evidence of active infection at this time which is great news and overall I am very pleased I think this needs a little time to toughen up but other than that I really do believe were doing quite well. He is very pleased to hear  this its been a long time coming. 05-19-2022 upon evaluation today patient appears to be doing well currently in regard to his wound. He in fact we were hoping will be completely healed and closed but it still has a very tiny area right in the center which is still continuing to drain. Fortunately I do not see any signs of infection locally or systemically which is great news. 11/6; this wound is close to completely" he comes in this week with some erythema at 1 edge of this which looks like something was rubbing on the area. Other than that no evidence of infection 06-16-2022 upon evaluation today patient appears to be doing well currently in regard to his ankle ulcer. This is very close to being healed but still has a small area in the central portion which is not. Fortunately there does not appear to be any signs of infection locally or systemically which is great news. No fevers, chills, nausea, vomiting, or diarrhea. 06-30-2022 upon evaluation patient's wound pretty much appears to be almost completely closed. In fact it may even be closed but there are still a lot of skin irritation around the edges of the wound and I just do not feel great about releasing them yet I would like to continue to monitor this just a little bit longer before getting to that point. He is not opposed to this and in fact is happy to continue to come in if need be in order to make sure that  things are moving in the right direction he definitely does not want to backtrack. 07-14-2022 upon evaluation today patient appears to be doing well currently in regard to his wound. Has been tolerating the dressing changes without complication. Fortunately I see no evidence of active infection locally nor systemically which is great news. No fevers, chills, nausea, vomiting, or diarrhea. 08-11-2022 upon evaluation today patient appears to be doing a little worse than last time I saw him although it has been a month. Unfortunately he  was not able to be seen due to the fact that he was quarantined in the facility with COVID. Subsequently he tells me that he mention to the facility staff several times about taking it off over the past several weeks but they continue to tell him that someone have to get in touch with [place] here although nobody ever did until Friday when the nurse manager called to have that documented in the communication notes. With that being said unfortunately this means that he had a wrap on that he was supposed to have on for only 1 week for ended up being on four 1 month essentially. I am glad that there is nothing worse than what we see currently to be perfectly honest. 08-19-2022 upon evaluation today patient appears to be doing well currently in regard to his wounds. He has been tolerating the dressing changes without complication. This is actually smaller today which is great news after last week's reopening. Fortunately I do not see any evidence of infection locally nor systemically at this point. 08-26-2022 upon evaluation today patient appears to be doing well currently in regard to his wound. He has been tolerating the dressing changes without complication. Fortunately there does not appear to be any signs of active infection locally nor systemically which is great news. No fevers, chills, nausea, vomiting, or diarrhea. 09-02-2022 upon evaluation today patient's wound actually showing signs of excellent improvement this is measuring smaller and looking much better. I am very pleased with where we stand I do believe that we are headed in the right direction. 09-09-2022 upon evaluation today patient appears to be doing decently well in regard to his leg in general although there was some swelling this is the 1 thing that I am not too happy with based on what I see currently. Fortunately there does not appear to be any signs of infection locally nor systemically at this point. 09-16-2022 upon evaluation today  patient appears to be doing well currently in regard to his wound. He is actually showing signs of improvement he wore the Tubigrip this week and it significantly better compared to last week's evaluation. Fortunately I do not see any evidence of active infection locally or systemically which is great news. Overall I think that were headed in the right direction which is great news. In fact overall I think this is the best that he has looked in some time. He does not like the Tubigrip but actually did extremely well with this in my opinion. I think he needs to continue to utilize this. 09-23-2022 upon evaluation today patient appears to be doing well currently in regard to his wound. He has been tolerating the dressing changes without complication. Fortunately there does not appear to be any signs of active infection locally nor systemically which is great news and in general I do feel like that we are headed in the right direction. He has been having trouble with the Tubigrip however we have gotten the facility to order  compression socks he has not gotten them as of yet. 3/4; patient is about the same with a wound on the left posterior leg. This is a venous wound. He needs more compression on this leg but he took off the previous wraps we are using. I am not certain whether he is using juxta lite stockings at the nursing home. 3/12; patient presents for follow-up. He unfortunately has 2 new wounds 1 to the dorsal left side and the other to the right heel. He is not sure how these happened. The right heel appears to be caused by pressure. He has been using Hydrofera Blue to the original wound on the left leg. 10-14-2022 upon evaluation today patient appears to be doing well currently in regard to his original wound but unfortunately he has several new wounds which I was completely unaware of before walking into the room to see him today. Unfortunately he seems to not be doing nearly as well as what I saw  when I last had an appointment with him 3 weeks ago. Fortunately I do not see any signs of systemic infection though unfortunately locally he has definite infection of the left lower extremity. He is currently on doxycycline I am going to likely have to extend that today. 10-21-2022 upon evaluation today patient appears to be doing well currently in regard to his wounds. He seems to be tolerating the dressing changes without complication. Fortunately there does not appear to be any signs of active infection locally or systemically which is great news and in general I definitely feel like we are on the right track here. JIAIRE, Chad [name] ([id number]) [id number] Page 13 of 16 10-30-2022 upon evaluation today patient still continues to not be doing too well at all with regard to his wounds. Unfortunately he is still having signs of infection as well which also has been concerned. Fortunately I do not see any signs of active infection locally nor systemically at this time. No fevers, chills, nausea, vomiting, or diarrhea. 11-06-2022 upon evaluation today patient appears to be doing okay currently in regard to his wounds. All things considered with his blood flow not being nearly as good as what should I think that he is making pretty good progress here. Fortunately I do not see any signs of infection locally nor systemically which is great news. No fevers, chills, nausea, vomiting, or diarrhea. 11-13-2022 upon evaluation today patient appears to be doing poorly currently in regard to his wounds. Things seem to be getting a little bit worse not better. 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 Subsequently he is also going to need to have vascular intervention but we are unsure exactly when this is going on the undertaking. I would try to actually send a message to Dr. Hazle Quant and the  vascular team just to see what exactly the plan is going forward. The patient is in agreement with that plan I just think we need to have a plan especially in light of the fact the left leg seems to be getting a little bit worse at this point. 11-25-2022 upon evaluation today patient is currently now discharged from the hospital following his colostomy procedure. That did very well and he seems to be doing well. Fortunately there does not appear to be any signs of active infection locally or systemically which is great news. No fevers, chills, nausea, vomiting, or diarrhea. 12-04-2022 upon  evaluation today patient appears to be doing actually decently well in regard to his wounds and actually very pleased with where we stand and I think that he is making some pretty good progress here. Fortunately I do not see any signs of active infection locally nor systemically which is great news. No fevers, chills, nausea, vomiting, or diarrhea. 12-12-2022 upon evaluation today patient appears to be doing well currently in regard to his wound. He has been tolerating the dressing changes without complication. The wounds all seem to be making some progress to a degree. He has appointment with vascular on the 23rd of this month. 5/24; the patient was scheduled for a left lower extremity angiogram yesterday but apparently they were overbooked and they wanted to admit him to hospital last night so they could do this early this morning but he refused I see that Dr. Wyn Quaker has rescheduled him for June 6. He is using silver alginate to wounds on his bilateral lower extremities. 01-02-2023 upon evaluation today patient appears to be doing poorly in regard to his wounds still. Subsequently he did have his angiogram but apparently there really was not much that was found that could be revascularized. The patient tells me that Dr. Wyn Quaker was going to contact me here in the office I have not heard from him as of yet and I may see about  giving him a call just to follow-up or possibly send a message through epic which could be an option as well. 01-09-2023 upon evaluation today patient appears to be doing poorly in regard to his legs he is extremely swollen. He has been sleeping in his chair he tells me for the past week. His bed is broken and they have ordered a new one which is supposedly supposed to be there today. With that being said he the way he needs to be not sleeping in his chair that is his wheelchair every day and through the night. This is causing his legs to be much more swollen and it seen and how badly wounds are doing at this point. This has made things significantly worse. Fortunately I do not see any signs of infection right now unfortunately I do believe that the patient has a tremendous amount of drainage currently. 01-19-2023 upon evaluation today patient appears to be doing poorly in regard to his wounds of his legs his swelling is better but still not as good as what we would like to see. Fortunately I do not see any evidence of active infection locally nor systemically which is great news and in general I do believe that we are moving in the right direction here. Nonetheless it would be better if she was in the bed sleeping or not send up in his wheelchair although he seems to still be set up in the wheelchair at this time. He tells me that he is just not as comfortable in the bed as the wheelchair but unfortunately this is causing damage to his legs which I discussed with him today as well. Nonetheless I am not sure that he is going to be amendable to doing anything different. 01-27-2023 upon evaluation today patient appears to be doing well currently. Fortunately I do not see any evidence of active infection locally nor systemically which is great news I think he is actually doing the best option and while in fact I would try to see if we can do some debridement today to get things moving along. 02-03-2023 upon  evaluation today patient's wounds  unfortunately are not doing nearly as well. Will be doing the [employer] wraps unfortunately we just not seeing the improvement that I would like to see. Fortunately I do not see any evidence of infection at this time which is good news unfortunately I do think that he needs to be changed on a daily basis due to the amount of drainage otherwise I do not think this is really going to continue to improve dramatically. 02-12-2023 upon evaluation today patient appears to be doing better in a lot of ways in regard to his wounds and although his toes on the left side actually seem a bit worse. Fortunately I do not see any evidence of active infection locally nor systemically at this time which is good news. With that being said he is having some bleeding from the left leg I think that this is an area that looks like it probably got bumped. With that being said I discussed this with the patient he tells me he does not know of any trauma that occurred but at the same time if was not this that it was skin that got pulled off. Dressing change there is only one of the 2 that could have happened. Objective Constitutional Well-nourished and well-hydrated in no acute distress. Vitals Time Taken: 9:48 AM, Height: 69 in, Weight: 150 lbs, BMI: 22.1, Temperature: 98.0 F, Pulse: 92 bpm, Respiratory Rate: 18 breaths/min, Blood Pressure: 117/78 mmHg. Respiratory normal breathing without difficulty. Psychiatric this patient is able to make decisions and demonstrates good insight into disease process. Alert and Oriented x 3. pleasant and cooperative. General Notes: Upon inspection patient's wound bed actually showed signs of good granulation epithelization at this point. Fortunately I do not see any signs of worsening overall and in general I do believe that the patient is making headway towards closure although his left foot still has a pretty rough shape we will need to try to put some  silver alginate between toes to help and dry this up. Integumentary (Hair, Skin) Wound #12 status is Open. Original cause of wound was Gradually Appeared. The date acquired was: 07/12/2019. The wound has been in treatment 61 weeks. The wound is located on the Left,Distal,Medial Ankle. The wound measures 2cm length x 1.8cm width x 0.1cm depth; 2.827cm^2 area and 0.283cm^3 volume. There is a medium amount of serosanguineous drainage noted. QUITMAN, GRENIER ([id number]) [id number] Page 14 of 16 Wound #15 status is Open. Original cause of wound was Gradually Appeared. The date acquired was: 10/01/2022. The wound has been in treatment 18 weeks. The wound is located on the Left,Dorsal Foot. The wound measures 7cm length x 7cm width x 0.2cm depth; 38.485cm^2 area and 7.697cm^3 volume. There is a medium amount of serosanguineous drainage noted. There is no granulation within the wound bed. There is a large (67-100%) amount of necrotic tissue within the wound bed including Adherent Slough. Wound #16 status is Open. Original cause of wound was Gradually Appeared. The date acquired was: 09/26/2022. The wound has been in treatment 18 weeks. The wound is located on the Right Calcaneus. The wound measures 2.5cm length x 1.5cm width x 0.1cm depth; 2.945cm^2 area and 0.295cm^3 volume. There is a medium amount of serosanguineous drainage noted. There is small (1-33%) pink granulation within the wound bed. There is a large (67-100%) amount of necrotic tissue within the wound bed including Adherent Slough. Wound #17 status is Open. Original cause of wound was Pressure Injury. The date acquired was: 09/30/2022. The wound has been in  treatment 17 weeks. The wound is located on the Left,Anterior Lower Leg. The wound measures 6cm length x 1.7cm width x 0.1cm depth; 8.011cm^2 area and 0.801cm^3 volume. There is a medium amount of serosanguineous drainage noted. There is no granulation within the wound  bed. There is a large (67-100%) amount of necrotic tissue within the wound bed including Adherent Slough. Wound #18 status is Open. Original cause of wound was Pressure Injury. The date acquired was: 09/26/2022. The wound has been in treatment 17 weeks. The wound is located on the Right,Medial T Second. The wound measures 1.6cm length x 0.5cm width x 0.21cm depth; 0.628cm^2 area and 0.132cm^3 volume. oe There is a medium amount of serosanguineous drainage noted. There is no granulation within the wound bed. There is a large (67-100%) amount of necrotic tissue within the wound bed including Adherent Slough. Wound #19 status is Open. Original cause of wound was Gradually Appeared. The date acquired was: 11/10/2022. The wound has been in treatment 13 weeks. The wound is located on the Left,Posterior Lower Leg. The wound measures 1.5cm length x 1.7cm width x 0.1cm depth; 2.003cm^2 area and 0.2cm^3 volume. There is a medium amount of serous drainage noted. There is no granulation within the wound bed. There is a large (67-100%) amount of necrotic tissue within the wound bed including Adherent Slough. 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 An CHEM CAUT GRANULATION TISS procedure was performed by [name], PA-C. Post procedure Diagnosis Wound #17: Same as Pre-Procedure Notes: 1 silver nitrate stick used Plan Follow-up Appointments: Return Appointment in 1 week. Nurse Visit as needed Bathing/ Shower/ Hygiene: May shower with wound  dressing protected with water repellent cover or cast protector. No tub bath. Anesthetic (Use 'Patient Medications' Section for Anesthetic Order Entry): Lidocaine applied to wound bed Edema Control - Lymphedema / Segmental Compressive Device / Other: Elevate legs to the level of the heart and pump ankles as often as possible Elevate leg(s) parallel to the floor when sitting. Other: - PATIENT MUST SLEEP IN A BED. Legs are swelling due to no elevation of legs. Off-Loading: Heel suspension boot - when in bed Medications-Please add to medication list.: Other: - apply AandD ointment to leg, do not apply to wound WOUND #12: - Ankle Wound Laterality: Left, Medial, Distal Cleanser: Soap and Water 1 x Per Day/30 Days Discharge Instructions: Gently cleanse wound with antibacterial soap, rinse and pat dry prior to dressing wounds Cleanser: Wound Cleanser 1 x Per Day/30 Days Discharge Instructions: Wash your hands with soap and water. Remove old dressing, discard into plastic bag and place into trash. Cleanse the wound with Wound Cleanser prior to applying a clean dressing using gauze sponges, not tissues or cotton balls. Do not scrub or use excessive force. Pat dry using gauze sponges, not tissue or cotton balls. Prim Dressing: Silvercel Small 2x2 (in/in) 1 x Per Day/30 Days ary Discharge Instructions: Apply Silvercel Small 2x2 (in/in) as instructed Secondary Dressing: ABD Pad 5x9 (in/in) 1 x Per Day/30 Days Discharge Instructions: Cover with ABD pad Secured With: ACE WRAP - 51M ACE Elastic Bandage With VELCRO Brand Closure,  4 (in) 1 x Per Day/30 Days WOUND #15: - Foot Wound Laterality: Dorsal, Left ARNAV, GARRITY ([id number]) [phone] Page 15 of 16 Cleanser: Soap and Water 1 x Per Day/30 Days Discharge Instructions: Gently cleanse wound with antibacterial soap, rinse and pat dry prior to dressing wounds Cleanser: Wound Cleanser 1 x Per Day/30 Days Discharge  Instructions: Wash your hands with soap and water. Remove old dressing, discard into plastic bag and place into trash. Cleanse the wound with Wound Cleanser prior to applying a clean dressing using gauze sponges, not tissues or cotton balls. Do not scrub or use excessive force. Pat dry using gauze sponges, not tissue or cotton balls. Prim Dressing: Silvercel Small 2x2 (in/in) 1 x Per Day/30 Days ary Discharge Instructions: Place in between the toes and on the foot Secondary Dressing: ABD Pad 5x9 (in/in) 1 x Per Day/30 Days Discharge Instructions: Cover with ABD pad Secured With: ACE WRAP - 4M ACE Elastic Bandage With VELCRO Brand Closure, 4 (in) 1 x Per Day/30 Days WOUND #16: - Calcaneus Wound Laterality: Right Cleanser: Soap and Water 1 x Per Day/30 Days Discharge Instructions: Gently cleanse wound with antibacterial soap, rinse and pat dry prior to dressing wounds Cleanser: Wound Cleanser 1 x Per Day/30 Days Discharge Instructions: Wash your hands with soap and water. Remove old dressing, discard into plastic bag and place into trash. Cleanse the wound with Wound Cleanser prior to applying a clean dressing using gauze sponges, not tissues or cotton balls. Do not scrub or use excessive force. Pat dry using gauze sponges, not tissue or cotton balls. Prim Dressing: Silvercel Small 2x2 (in/in) 1 x Per Day/30 Days ary Discharge Instructions: Apply Silvercel Small 2x2 (in/in) as instructed Secondary Dressing: ABD Pad 5x9 (in/in) 1 x Per Day/30 Days Discharge Instructions: Cover with ABD pad Secured With: ACE WRAP - 4M ACE Elastic Bandage With VELCRO Brand Closure, 4 (in) 1 x Per Day/30 Days Secured With: Heel cup 1 x Per Day/30 Days Discharge Instructions: May reuse over ABD WOUND #17: - Lower Leg Wound Laterality: Left, Anterior Cleanser: Soap and Water 1 x Per Day/30 Days Discharge Instructions: Gently cleanse wound with antibacterial soap, rinse and pat dry prior to dressing  wounds Cleanser: Wound Cleanser 1 x Per Day/30 Days Discharge Instructions: Wash your hands with soap and water. Remove old dressing, discard into plastic bag and place into trash. Cleanse the wound with Wound Cleanser prior to applying a clean dressing using gauze sponges, not tissues or cotton balls. Do not scrub or use excessive force. Pat dry using gauze sponges, not tissue or cotton balls. Prim Dressing: Silvercel Small 2x2 (in/in) 1 x Per Day/30 Days ary Discharge Instructions: Apply Silvercel Small 2x2 (in/in) as instructed Secondary Dressing: ABD Pad 5x9 (in/in) 1 x Per Day/30 Days Discharge Instructions: Cover with ABD pad Secured With: ACE WRAP - 4M ACE Elastic Bandage With VELCRO Brand Closure, 4 (in) 1 x Per Day/30 Days WOUND #18: - T Second Wound Laterality: Right, Medial oe Cleanser: Soap and Water 1 x Per Day/30 Days Discharge Instructions: Gently cleanse wound with antibacterial soap, rinse and pat dry prior to dressing wounds Cleanser: Wound Cleanser 1 x Per Day/30 Days Discharge Instructions: Wash your hands with soap and water. Remove old dressing, discard into plastic bag and place into trash. Cleanse the wound with Wound Cleanser prior to applying a clean dressing using gauze sponges, not tissues or cotton balls. Do not scrub or use excessive force. Pat dry using gauze sponges, not tissue or  cotton balls. Prim Dressing: Silvercel Small 2x2 (in/in) 1 x Per Day/30 Days ary Discharge Instructions: Apply Silvercel Small 2x2 (in/in) as instructed Secondary Dressing: Coverlet Latex-Free Fabric Adhesive Dressings 1 x Per Day/30 Days Discharge Instructions: Knuckle WOUND #19: - Lower Leg Wound Laterality: Left, Posterior Cleanser: Soap and Water 1 x Per Day/30 Days Discharge Instructions: Gently cleanse wound with antibacterial soap, rinse and pat dry prior to dressing wounds Cleanser: Wound Cleanser 1 x Per Day/30 Days Discharge Instructions: Wash your hands with soap and  water. Remove old dressing, discard into plastic bag and place into trash. Cleanse the wound with Wound Cleanser prior to applying a clean dressing using gauze sponges, not tissues or cotton balls. Do not scrub or use excessive force. Pat dry using gauze sponges, not tissue or cotton balls. Prim Dressing: Silvercel Small 2x2 (in/in) 1 x Per Day/30 Days ary Discharge Instructions: Apply Silvercel Small 2x2 (in/in) as instructed Secondary Dressing: ABD Pad 5x9 (in/in) 1 x Per Day/30 Days Discharge Instructions: Cover with ABD pad Secured With: ACE WRAP - 71M ACE Elastic Bandage With VELCRO Brand Closure, 4 (in) 1 x Per Day/30 Days 1. Would recommend that we have the patient continue to monitor for any signs of infection or worsening. Seen I do believe that we are making headway towards complete closure. With that being said this is very slow and again we have double checked has had vascular evaluation his blood flow is great. They see no issues. With that being said unfortunately continues to have very slow healing I do not think he is infected at the moment but I do think that swelling plays a big role here. 2. We need to make sure that he is having the compression wrap that is the Ace wrap high enough to help control what we need to control from a swelling perspective. That really was not high enough last so at this time we will get a make a mark so they know where to wrap up to I think this is can be the ideal way to go. 3. I am also can recommend that the patient should continue to monitor for any evidence of worsening or infection overall. Obviously if anything changes he should contact the office and let me know. We will see patient back for reevaluation in 1 week here in the clinic. If anything worsens or changes patient will contact our office for additional recommendations. Electronic Signature(s) Signed: 03/11/2023 9:13:31 AM By: Elliot Gurney, BSN, RN, CWS, Kim RN, BSN Signed: 03/11/2023 5:04:56  PM By: [name] PA-C Previous Signature: 02/12/2023 11:25:18 AM Version By: [name] PA-C Entered By: Elliot Gurney BSN, RN, CWS, Kim on 03/11/2023 09:13:31 [name] ([id number]) [id number] Page 16 of 16 -------------------------------------------------------------------------------- SuperBill Details Patient Name: Date of Service: CALLAWAY, KUROWSKI 02/12/2023 Medical Record Number: [id number] Patient Account Number: [id number] Date of Birth/Sex: Treating RN: 04/02/1961 (62 y.o. Chad [name] Primary Care Provider: [name] Other Clinician: Referring Provider: Treating Provider/Extender: [name] Weeks in Treatment: 61 Diagnosis Coding ICD-10 Codes Code Description [phone] Chronic venous hypertension (idiopathic) with ulcer and inflammation of left lower extremity 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 side 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, stage 3 Facility Procedures : CPT4 Code: [id number] Description: 17250 - CHEM CAUT GRANULATION TISS ICD-10 Diagnosis Description L98.492 Non-pressure chronic ulcer of skin  of other sites with fat layer exposed Modifier: Quantity: 1 Physician Procedures : CPT4 Code Description Modifier [id number] - WC PHYS LEVEL 3 - EST PT 25 ICD-10 Diagnosis Description 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 (idiopathic) 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

## 2023-02-12 NOTE — Progress Notes (Addendum)
REUVEN, BRAVER ([id number]) [id number] Page 1 of 16 Visit Report for 02/12/2023 Arrival Information Details Patient Name: Date of Service: Chad [name], Chad [name] 02/12/2023 9:15 A M Medical Record Number: [id number] Patient Account Number: [id number] Date of Birth/Sex: Treating RN: 1960-07-31 (62 y.o. [name] Primary Care Muntaha Vermette: [name] Other Clinician: Referring Dustan Hyams: Treating Holdyn Poyser/Extender: [name] in Treatment: 31 Visit Information History Since Last Visit All ordered tests and consults were completed: No Patient Arrived: Wheel Chair Added or deleted any medications: No Arrival Time: 09:45 Any new allergies or adverse reactions: No Transfer Assistance: EasyPivot Patient Lift Had a fall or experienced change in No Patient Identification Verified: Yes activities of daily living that may affect Secondary Verification Process Completed: Yes risk of falls: Patient Requires Transmission-Based No Signs or symptoms of abuse/neglect since last visito No Precautions: Hospitalized since last visit: No Patient Has Alerts: Yes Implantable device outside of the clinic excluding No Patient Alerts: Patient on Blood Thinner cellular tissue based products placed in the center NOT diabetic since last visit: aspirin 81mg  Has Dressing in Place as Prescribed: Yes Lives [facility] Pain Present Now: Yes ABI R 0.81 11/05/22 ABI L 0.59 11/05/22 Electronic Signature(s) Signed: 02/12/2023 1:53:12 PM By: Midge Aver MSN RN CNS WTA Entered By: Midge Aver on 02/12/2023 13:53:12 -------------------------------------------------------------------------------- Clinic Level of Care Assessment Details Patient Name: Date of Service: [name], TISSUE 02/12/2023 9:15 A M Medical Record Number: [id number] Patient Account Number: [id number] Date of Birth/Sex: Treating RN: 08-16-1960 (62 y.o. [name] Primary Care Lukus Binion:  [name] Other Clinician: Referring Carnie Bruemmer: Treating Yachet Mattson/Extender: [name] Weeks in Treatment: 61 Clinic Level of Care Assessment Items TOOL 1 Quantity Score []  - 0 Use when EandM and Procedure is performed on INITIAL visit ASSESSMENTS - Nursing Assessment / Reassessment []  - 0 General Physical Exam (combine w/ comprehensive assessment (listed just below) when performed on new pt. [address]SERIGNE, KUBICEK ([id number]) [id number] Page 2 of 16 []  - 0 Comprehensive Assessment (HX, ROS, Risk Assessments, Wounds Hx, etc.) ASSESSMENTS - Wound and Skin Assessment / Reassessment []  - 0 Dermatologic / Skin Assessment (not related to wound area) ASSESSMENTS - Ostomy and/or Continence Assessment and Care []  - 0 Incontinence Assessment and Management []  - 0 Ostomy Care Assessment and Management (repouching, etc.) PROCESS - Coordination of Care []  - 0 Simple Patient / Family Education for ongoing care []  - 0 Complex (extensive) Patient / Family Education for ongoing care []  - 0 Staff obtains Chiropractor, Records, T Results / Process Orders est []  - 0 Staff telephones HHA, Nursing Homes / Clarify orders / etc []  - 0 Routine Transfer to another Facility (non-emergent condition) []  - 0 Routine Hospital Admission (non-emergent condition) []  - 0 New Admissions / Manufacturing engineer / Ordering NPWT Apligraf, etc. , []  - 0 Emergency Hospital Admission (emergent condition) PROCESS - Special Needs []  - 0 Pediatric / Minor Patient Management []  - 0 Isolation Patient Management []  - 0 Hearing / Language / Visual special needs []  - 0 Assessment of Community assistance (transportation, D/C planning, etc.) []  - 0 Additional assistance / Altered mentation []  - 0 Support Surface(s) Assessment (bed, cushion, seat, etc.) INTERVENTIONS - Miscellaneous []  - 0 External ear exam []  - 0 Patient Transfer (multiple staff / Nurse, adult /  Similar devices) []  - 0 Simple Staple / Suture removal (25 or less) []  - 0 Complex Staple / Suture removal (26 or more) []  - 0 Hypo/Hyperglycemic Management (do not  check if billed separately) []  - 0 Ankle / Brachial Index (ABI) - do not check if billed separately Has the patient been seen at the hospital within the last three years: Yes Total Score: 0 Level Of Care: ____ Electronic Signature(s) Signed: 02/12/2023 4:23:38 PM By: Midge Aver MSN RN CNS WTA Entered By: Midge Aver on 02/12/2023 13:54:04 -------------------------------------------------------------------------------- Encounter Discharge Information Details Patient Name: Date of Service: [name] 02/12/2023 9:15 A M Medical Record Number: [id number] Patient Account Number: [id number] Date of Birth/Sex: Treating RN: [birth year] (62 y.o. [name] Primary Care Jameek Bruntz: [name] Other Clinician: [name] ([id number]) [id number] Page 3 of 16 Referring Jereme Loren: Treating Tyee Vandevoorde/Extender: [name] in Treatment: 61 Encounter Discharge Information Items Discharge Condition: Stable Ambulatory Status: Wheelchair Discharge Destination: Skilled Nursing Facility Telephoned: No Orders Sent: Yes Transportation: Other Accompanied By: self Schedule Follow-up Appointment: Yes Clinical Summary of Care: Electronic Signature(s) Signed: 02/12/2023 1:55:38 PM By: Midge Aver MSN RN CNS WTA Entered By: Midge Aver on 02/12/2023 13:55:38 -------------------------------------------------------------------------------- Lower Extremity Assessment Details Patient Name: Date of Service: [name], [name] 02/12/2023 9:15 A M Medical Record Number: [id number] Patient Account Number: [id number] Date of Birth/Sex: Treating RN: 21-Dec-1960 (62 y.o. [name] Primary Care Adaisha Campise: [name] Other Clinician: Referring Shalisa Mcquade: Treating Shaquil Aldana/Extender:  [name] Weeks in Treatment: 61 Edema Assessment Assessed: Kyra Searles: Yes] Franne Forts: Yes] [Left: Edema] [Right: :] Calf Left: Right: Point of Measurement: 34 cm From Medial Instep 36.5 cm 35.5 cm Ankle Left: Right: Point of Measurement: 12 cm From Medial Instep 24 cm 23 cm Vascular Assessment Pulses: Dorsalis Pedis Palpable: [Left:Yes] [Right:Yes] Electronic Signature(s) Signed: 02/12/2023 1:53:28 PM By: Midge Aver MSN RN CNS WTA Entered By: Midge Aver on 02/12/2023 13:53:28 [name] ([id number]) [id number] Page 4 of 16 -------------------------------------------------------------------------------- Multi Wound Chart Details Patient Name: Date of Service: BRANTLEY, WILEY 02/12/2023 9:15 A M Medical Record Number: [id number] Patient Account Number: [id number] Date of Birth/Sex: Treating RN: April 30, 1961 (62 y.o. [name] Primary Care Silvino Selman: [name] Other Clinician: Referring Maelin Kurkowski: Treating Kein Carlberg/Extender: [name] Weeks in Treatment: 82 Vital Signs Height(in): 69 Pulse(bpm): 92 Weight(lbs): 150 Blood Pressure(mmHg): 117/78 Body Mass Index(BMI): 22.1 Temperature(F): 98.0 Respiratory Rate(breaths/min): 18 [12:Photos:] Left, Distal, Medial Ankle Left, Dorsal Foot Right Calcaneus Wound Location: Gradually Appeared Gradually Appeared Gradually Appeared Wounding Event: Venous Leg Ulcer Venous Leg Ulcer Pressure Ulcer Primary Etiology: Anemia, Chronic Obstructive Anemia, Chronic Obstructive Anemia, Chronic Obstructive Comorbid History: Pulmonary Disease (COPD), Coronary Pulmonary Disease (COPD), Coronary Pulmonary Disease (COPD), Coronary Artery Disease, Peripheral Arterial Artery Disease, Peripheral Arterial Artery Disease, Peripheral Arterial Disease, Peripheral Venous Disease, Disease, Peripheral Venous Disease, Disease, Peripheral Venous Disease, Hepatitis C, Osteoarthritis, Hepatitis  C, Osteoarthritis, Hepatitis C, Osteoarthritis, Neuropathy, Received Chemotherapy Neuropathy, Received Chemotherapy Neuropathy, Received Chemotherapy 07/12/2019 10/01/2022 09/26/2022 Date Acquired: 61 18 18 Weeks of Treatment: Open Open Open Wound Status: No No No Wound Recurrence: No Yes Yes Clustered Wound: 2x1.8x0.1 7x7x0.2 2.5x1.5x0.1 Measurements L x W x D (cm) 2.827 38.485 2.945 A (cm) : rea 0.283 7.697 0.295 Volume (cm) : 59.10% -1533.50% -212.60% % Reduction in Area: 79.50% -3161.40% -56.90% % Reduction in Volume: Full Thickness Without Exposed Full Thickness Without Exposed Category/Stage III Classification: Support Structures Support Structures Medium Medium Medium Exudate A mount: Serosanguineous Serosanguineous Serosanguineous Exudate Type: red, brown red, brown red, brown Exudate Color: N/A None Present (0%) Small (1-33%) Granulation A mount: N/A N/A Pink Granulation Quality: N/A Large (67-100%) Large (67-100%) Necrotic A mount: N/A  None None Epithelialization: N/A N/A N/A Procedures Performed: Wound Number: 17 18 19  PhotosDONNELL, WION ([id number]) [id number] Page 5 of 16 Left, Anterior Lower Leg Right, Medial T Second oe Left, Posterior Lower Leg Wound Location: Pressure Injury Pressure Injury Gradually Appeared Wounding Event: Pressure Ulcer Pressure Ulcer Venous Leg Ulcer Primary Etiology: Anemia, Chronic Obstructive Anemia, Chronic Obstructive Anemia, Chronic Obstructive Comorbid History: Pulmonary Disease (COPD), Coronary Pulmonary Disease (COPD), Coronary Pulmonary Disease (COPD), Coronary Artery Disease, Peripheral Arterial Artery Disease, Peripheral Arterial Artery Disease, Peripheral Arterial Disease, Peripheral Venous Disease, Disease, Peripheral Venous Disease, Disease, Peripheral Venous Disease, Hepatitis C, Osteoarthritis, Hepatitis C, Osteoarthritis, Hepatitis C, Osteoarthritis, Neuropathy, Received  Chemotherapy Neuropathy, Received Chemotherapy Neuropathy, Received Chemotherapy 09/30/2022 09/26/2022 11/10/2022 Date Acquired: 17 17 13  Weeks of Treatment: Open Open Open Wound Status: No No No Wound Recurrence: No No Yes Clustered Wound: 6x1.7x0.1 1.6x0.5x0.21 1.5x1.7x0.1 Measurements L x W x D (cm) 8.011 0.628 2.003 A (cm) : rea 0.801 0.132 0.2 Volume (cm) : -94.30% -14.20% -183.30% % Reduction in Area: -94.40% -140.00% -181.70% % Reduction in Volume: Category/Stage II Category/Stage II Full Thickness Without Exposed Classification: Support Structures Medium Medium Medium Exudate A mount: Serosanguineous Serosanguineous Serous Exudate Type: red, brown red, brown amber Exudate Color: None Present (0%) None Present (0%) None Present (0%) Granulation A mount: N/A N/A N/A Granulation Quality: Large (67-100%) Large (67-100%) Large (67-100%) Necrotic A mount: None None None Epithelialization: CHEM CAUT GRANULATION TISS N/A N/A Procedures Performed: Treatment Notes Electronic Signature(s) Signed: 02/12/2023 1:53:34 PM By: Midge Aver MSN RN CNS WTA Entered By: Midge Aver on 02/12/2023 13:53:34 -------------------------------------------------------------------------------- Multi-Disciplinary Care Plan Details Patient Name: Date of Service: [name] 02/12/2023 9:15 A M Medical Record Number: [id number] Patient Account Number: [id number] Date of Birth/Sex: Treating RN: February 27, 1961 (62 y.o. [name] Primary Care Ahniya Mitchum: [name] Other Clinician: Referring Celisse Ciulla: Treating Louretta Tantillo/Extender: [name] Weeks in Treatment: 22 Active Inactive Venous Leg Ulcer Nursing Diagnoses: Knowledge deficit related to disease process and management Goals: Patient will maintain optimal edema control Date Initiated: 12/06/2021 Target Resolution Date: 02/23/2023 Goal Status: Active Patient/caregiver will verbalize understanding of disease  process and disease management Date Initiated: 12/06/2021 Date Inactivated: 12/19/2022 Target Resolution Date: 11/27/2022 Goal Status: Met Interventions: Assess peripheral edema status every visit. MORTIMER, BAIR ([id number]) [id number] Page 6 of 16 Compression as ordered Notes: Electronic Signature(s) Signed: 02/12/2023 1:54:30 PM By: Midge Aver MSN RN CNS WTA Entered By: Midge Aver on 02/12/2023 13:54:30 -------------------------------------------------------------------------------- Pain Assessment Details Patient Name: Date of Service: KACEN, MELLINGER 02/12/2023 9:15 A M Medical Record Number: [id number] Patient Account Number: [id number] Date of Birth/Sex: Treating RN: 08-09-1960 (62 y.o. [name] Primary Care Eulogio Requena: [name] Other Clinician: Referring Panagiota Perfetti: Treating Karole Oo/Extender: [name] Weeks in Treatment: 71 Active Problems Location of Pain Severity and Description of Pain Patient Has Paino Yes Site Locations Pain Location: Pain in Ulcers Duration of the Pain. Constant / Intermittento Constant Rate the pain. Current Pain Level: 2 Character of Pain Describe the Pain: Aching, Burning Pain Management and Medication Current Pain Management: Medication: Yes Cold Application: No Rest: No Massage: No Activity: No T.E.N.S.: No Heat Application: No Leg drop or elevation: No Is the Current Pain Management Adequate: Inadequate How does your wound impact your activities of daily livingo Sleep: No Bathing: No Appetite: No Relationship With Others: No Bladder Continence: No Emotions: No Bowel Continence: No Work: No Toileting: No Drive: No Dressing: No Hobbies: No Electronic Signature(sDUEY, LILLER ([id number]) [id number] Page  7 of 16 Signed: 02/12/2023 4:23:38 PM By: Midge Aver MSN RN CNS WTA Entered By: Midge Aver on 02/12/2023  13:53:20 -------------------------------------------------------------------------------- Patient/Caregiver Education Details Patient Name: Date of Service: Dill, RO BERT 7/18/2024andnbsp9:15 A M Medical Record Number: [id number] Patient Account Number: [id number] Date of Birth/Gender: Treating RN: Nov 18, 1960 (62 y.o. [name] Primary Care Physician: [name] Other Clinician: Referring Physician: Treating Physician/Extender: [name] in Treatment: 15 Education Assessment Education Provided To: Patient Education Topics Provided Wound/Skin Impairment: Handouts: Caring for Your Ulcer Methods: Explain/Verbal Responses: State content correctly Electronic Signature(s) Signed: 02/12/2023 4:23:38 PM By: Midge Aver MSN RN CNS WTA Entered By: Midge Aver on 02/12/2023 13:54:43 -------------------------------------------------------------------------------- Wound Assessment Details Patient Name: Date of Service: JAELAN, RASHEED 02/12/2023 9:15 A M Medical Record Number: [id number] Patient Account Number: [id number] Date of Birth/Sex: Treating RN: 1960/12/03 (62 y.o. [name] Primary Care Guy Toney: [name] Other Clinician: Referring Lio Wehrly: Treating Paylin Hailu/Extender: [name] Weeks in Treatment: 61 Wound Status Wound Number: 12 Primary Venous Leg Ulcer Etiology: Wound Location: Left, Distal, Medial Ankle Wound Open Wounding Event: Gradually Appeared Status: Date Acquired: 07/12/2019 Comorbid Anemia, Chronic Obstructive Pulmonary Disease (COPD), Coronary Weeks Of Treatment: 61 History: Artery Disease, Peripheral Arterial Disease, Peripheral Venous Clustered Wound: No Disease, Hepatitis C, Osteoarthritis, Neuropathy, Received Chemotherapy [name] ([id number]) [id number] Page 8 of 16 Photos Wound Measurements Length: (cm) 2 Width: (cm) 1.8 Depth: (cm) 0.1 Area: (cm)  2.827 Volume: (cm) 0.283 % Reduction in Area: 59.1% % Reduction in Volume: 79.5% Wound Description Classification: Full Thickness Without Exposed Support Exudate Amount: Medium Exudate Type: Serosanguineous Exudate Color: red, brown Structures Treatment Notes Wound #12 (Ankle) Wound Laterality: Left, Medial, Distal Cleanser Soap and Water Discharge Instruction: Gently cleanse wound with antibacterial soap, rinse and pat dry prior to dressing wounds Wound Cleanser Discharge Instruction: Wash your hands with soap and water. Remove old dressing, discard into plastic bag and place into trash. Cleanse the wound with Wound Cleanser prior to applying a clean dressing using gauze sponges, not tissues or cotton balls. Do not scrub or use excessive force. Pat dry using gauze sponges, not tissue or cotton balls. Peri-Wound Care Topical Primary Dressing Silvercel Small 2x2 (in/in) Discharge Instruction: Apply Silvercel Small 2x2 (in/in) as instructed Secondary Dressing ABD Pad 5x9 (in/in) Discharge Instruction: Cover with ABD pad Secured With ACE WRAP - 62M ACE Elastic Bandage With VELCRO Brand Closure, 4 (in) Compression Wrap Compression Stockings Add-Ons Electronic Signature(s) Signed: 02/12/2023 4:23:38 PM By: Midge Aver MSN RN CNS WTA Entered By: Midge Aver on 02/12/2023 10:27:16 [name] ([id number]) [id number] Page 9 of 16 -------------------------------------------------------------------------------- Wound Assessment Details Patient Name: Date of Service: ANDRA, MATSUO 02/12/2023 9:15 A M Medical Record Number: [id number] Patient Account Number: [id number] Date of Birth/Sex: Treating RN: 02/27/1961 (62 y.o. [name] Primary Care Vyla Pint: [name] Other Clinician: Referring Terrisha Lopata: Treating Deshawn Skelley/Extender: [name] Weeks in Treatment: 61 Wound Status Wound Number: 15 Primary Venous Leg  Ulcer Etiology: Wound Location: Left, Dorsal Foot Wound Open Wounding Event: Gradually Appeared Status: Date Acquired: 10/01/2022 Comorbid Anemia, Chronic Obstructive Pulmonary Disease (COPD), Coronary Weeks Of Treatment: 18 History: Artery Disease, Peripheral Arterial Disease, Peripheral Venous Clustered Wound: Yes Disease, Hepatitis C, Osteoarthritis, Neuropathy, Received Chemotherapy Photos Wound Measurements Length: (cm) 7 Width: (cm) 7 Depth: (cm) 0.2 Area: (cm) 38.485 Volume: (cm) 7.697 % Reduction in Area: -1533.5% % Reduction in Volume: -3161.4% Epithelialization: None Wound Description Classification: Full Thickness Without Exposed Supp Exudate Amount:  Medium Exudate Type: Serosanguineous Exudate Color: red, brown ort Structures Wound Bed Granulation Amount: None Present (0%) Necrotic Amount: Large (67-100%) Necrotic Quality: Adherent Slough Treatment Notes Wound #15 (Foot) Wound Laterality: Dorsal, Left Cleanser Soap and Water Discharge Instruction: Gently cleanse wound with antibacterial soap, rinse and pat dry prior to dressing wounds Wound Cleanser Discharge Instruction: Wash your hands with soap and water. Remove old dressing, discard into plastic bag and place into trash. Cleanse the wound with Wound Cleanser prior to applying a clean dressing using gauze sponges, not tissues or cotton balls. Do not scrub or use excessive force. Pat dry using gauze sponges, not tissue or cotton balls. ANTOLIN, BELSITO ([id number]) [id number] Page 10 of 16 Peri-Wound Care Topical Primary Dressing Silvercel Small 2x2 (in/in) Discharge Instruction: Place in between the toes and on the foot Secondary Dressing ABD Pad 5x9 (in/in) Discharge Instruction: Cover with ABD pad Secured With ACE WRAP - 64M ACE Elastic Bandage With VELCRO Brand Closure, 4 (in) Compression Wrap Compression Stockings Add-Ons Electronic Signature(s) Signed: 02/12/2023  4:23:38 PM By: Midge Aver MSN RN CNS WTA Entered By: Midge Aver on 02/12/2023 10:27:53 -------------------------------------------------------------------------------- Wound Assessment Details Patient Name: Date of Service: [name] 02/12/2023 9:15 A M Medical Record Number: [id number] Patient Account Number: [id number] Date of Birth/Sex: Treating RN: [birth year] (62 y.o. [name] Primary Care Emorie Mcfate: [name] Other Clinician: Referring Lavance Beazer: Treating Keigan Girten/Extender: [name] Weeks in Treatment: 61 Wound Status Wound Number: 16 Primary Pressure Ulcer Etiology: Wound Location: Right Calcaneus Wound Open Wounding Event: Gradually Appeared Status: Date Acquired: 09/26/2022 Comorbid Anemia, Chronic Obstructive Pulmonary Disease (COPD), Coronary Weeks Of Treatment: 18 History: Artery Disease, Peripheral Arterial Disease, Peripheral Venous Clustered Wound: Yes Disease, Hepatitis C, Osteoarthritis, Neuropathy, Received Chemotherapy Photos Wound Measurements Length: (cm) 2.5 Width: (cm) 1.5 Depth: (cm) 0.1 Area: (cm) 2.945 Koehne, Marvell ([id number]) Volume: (cm) 0.295 % Reduction in Area: -212.6% % Reduction in Volume: -56.9% Epithelialization: None [id number] Page 11 of 16 Wound Description Classification: Category/Stage III Exudate Amount: Medium Exudate Type: Serosanguineous Exudate Color: red, brown Wound Bed Granulation Amount: Small (1-33%) Granulation Quality: Pink Necrotic Amount: Large (67-100%) Necrotic Quality: Adherent Slough Treatment Notes Wound #16 (Calcaneus) Wound Laterality: Right Cleanser Soap and Water Discharge Instruction: Gently cleanse wound with antibacterial soap, rinse and pat dry prior to dressing wounds Wound Cleanser Discharge Instruction: Wash your hands with soap and water. Remove old dressing, discard into plastic bag and place into trash. Cleanse the wound with  Wound Cleanser prior to applying a clean dressing using gauze sponges, not tissues or cotton balls. Do not scrub or use excessive force. Pat dry using gauze sponges, not tissue or cotton balls. Peri-Wound Care Topical Primary Dressing Silvercel Small 2x2 (in/in) Discharge Instruction: Apply Silvercel Small 2x2 (in/in) as instructed Secondary Dressing ABD Pad 5x9 (in/in) Discharge Instruction: Cover with ABD pad Secured With ACE WRAP - 64M ACE Elastic Bandage With VELCRO Brand Closure, 4 (in) Heel cup Discharge Instruction: May reuse over ABD Compression Wrap Compression Stockings Add-Ons Electronic Signature(s) Signed: 02/12/2023 4:23:38 PM By: Midge Aver MSN RN CNS WTA Entered By: Midge Aver on 02/12/2023 10:28:17 -------------------------------------------------------------------------------- Wound Assessment Details Patient Name: Date of Service: [name] 02/12/2023 9:15 A M Medical Record Number: [id number] Patient Account Number: [id number] Date of Birth/Sex: Treating RN: [birth year] (62 y.o. [name] Primary Care Kortland Nichols: [name] Other Clinician: Referring Maricruz Lucero: Treating Cleda Imel/Extender: [name] Weeks in Treatment: [address], [place] ([id number]) [id number] Page 12 of 16 Wound  Status Wound Number: 17 Primary Pressure Ulcer Etiology: Wound Location: Left, Anterior Lower Leg Wound Open Wounding Event: Pressure Injury Status: Date Acquired: 09/30/2022 Comorbid Anemia, Chronic Obstructive Pulmonary Disease (COPD), Coronary Weeks Of Treatment: 17 History: Artery Disease, Peripheral Arterial Disease, Peripheral Venous Clustered Wound: No Disease, Hepatitis C, Osteoarthritis, Neuropathy, Received Chemotherapy Photos Wound Measurements Length: (cm) 6 Width: (cm) 1.7 Depth: (cm) 0.1 Area: (cm) 8.011 Volume: (cm) 0.801 % Reduction in Area: -94.3% % Reduction in Volume: -94.4% Epithelialization:  None Wound Description Classification: Category/Stage II Exudate Amount: Medium Exudate Type: Serosanguineous Exudate Color: red, brown Foul Odor After Cleansing: No Slough/Fibrino Yes Wound Bed Granulation Amount: None Present (0%) Necrotic Amount: Large (67-100%) Necrotic Quality: Adherent Slough Treatment Notes Wound #17 (Lower Leg) Wound Laterality: Left, Anterior Cleanser Soap and Water Discharge Instruction: Gently cleanse wound with antibacterial soap, rinse and pat dry prior to dressing wounds Wound Cleanser Discharge Instruction: Wash your hands with soap and water. Remove old dressing, discard into plastic bag and place into trash. Cleanse the wound with Wound Cleanser prior to applying a clean dressing using gauze sponges, not tissues or cotton balls. Do not scrub or use excessive force. Pat dry using gauze sponges, not tissue or cotton balls. Peri-Wound Care Topical Primary Dressing Silvercel Small 2x2 (in/in) Discharge Instruction: Apply Silvercel Small 2x2 (in/in) as instructed Secondary Dressing ABD Pad 5x9 (in/in) Discharge Instruction: Cover with ABD pad Secured With ACE WRAP - 27M ACE Elastic Bandage With VELCRO Brand Closure, 4 (in) Compression Wrap Compression Stockings Add-Ons NICANOR, MENDOLIA ([id number]) [id number] Page 13 of 16 Electronic Signature(s) Signed: 02/12/2023 4:23:38 PM By: Midge Aver MSN RN CNS WTA Entered By: Midge Aver on 02/12/2023 10:28:39 -------------------------------------------------------------------------------- Wound Assessment Details Patient Name: Date of Service: LEEAM, CEDRONE 02/12/2023 9:15 A M Medical Record Number: [id number] Patient Account Number: [id number] Date of Birth/Sex: Treating RN: Dec 21, 1960 (62 y.o. [name] Primary Care Kiaria Quinnell: [name] Other Clinician: Referring Siddhant Hashemi: Treating Eloni Darius/Extender: [name] Weeks in Treatment: 61 Wound  Status Wound Number: 18 Primary Pressure Ulcer Etiology: Wound Location: Right, Medial T Second oe Wound Open Wounding Event: Pressure Injury Status: Date Acquired: 09/26/2022 Comorbid Anemia, Chronic Obstructive Pulmonary Disease (COPD), Coronary Weeks Of Treatment: 17 History: Artery Disease, Peripheral Arterial Disease, Peripheral Venous Clustered Wound: No Disease, Hepatitis C, Osteoarthritis, Neuropathy, Received Chemotherapy Photos Wound Measurements Length: (cm) 1.6 Width: (cm) 0.5 Depth: (cm) 0.21 Area: (cm) 0.628 Volume: (cm) 0.132 % Reduction in Area: -14.2% % Reduction in Volume: -140% Epithelialization: None Wound Description Classification: Category/Stage II Exudate Amount: Medium Exudate Type: Serosanguineous Exudate Color: red, brown Foul Odor After Cleansing: No Slough/Fibrino Yes Wound Bed Granulation Amount: None Present (0%) Necrotic Amount: Large (67-100%) Necrotic Quality: Adherent Slough Treatment Notes Wound #18 (Toe Second) Wound Laterality: Right, Medial Cleanser [name] ([id number]) [id number] Page 14 of 16 Soap and Water Discharge Instruction: Gently cleanse wound with antibacterial soap, rinse and pat dry prior to dressing wounds Wound Cleanser Discharge Instruction: Wash your hands with soap and water. Remove old dressing, discard into plastic bag and place into trash. Cleanse the wound with Wound Cleanser prior to applying a clean dressing using gauze sponges, not tissues or cotton balls. Do not scrub or use excessive force. Pat dry using gauze sponges, not tissue or cotton balls. Peri-Wound Care Topical Primary Dressing Silvercel Small 2x2 (in/in) Discharge Instruction: Apply Silvercel Small 2x2 (in/in) as instructed Secondary Dressing Coverlet Latex-Free Fabric Adhesive Dressings Discharge Instruction: Knuckle Secured With Compression Wrap Compression Stockings Add-Ons Electronic  Signature(s) Signed: 02/12/2023 4:23:38 PM By: Midge Aver MSN RN CNS WTA Entered By: Midge Aver on 02/12/2023 10:29:02 -------------------------------------------------------------------------------- Wound Assessment Details Patient Name: Date of Service: TREQUAN, MARSOLEK 02/12/2023 9:15 A M Medical Record Number: [id number] Patient Account Number: [id number] Date of Birth/Sex: Treating RN: [birth year] (62 y.o. [name] Primary Care Jean Skow: [name] Other Clinician: Referring Coady Train: Treating Arelene Moroni/Extender: [name] Weeks in Treatment: 61 Wound Status Wound Number: 19 Primary Venous Leg Ulcer Etiology: Wound Location: Left, Posterior Lower Leg Wound Open Wounding Event: Gradually Appeared Status: Date Acquired: 11/10/2022 Comorbid Anemia, Chronic Obstructive Pulmonary Disease (COPD), Coronary Weeks Of Treatment: 13 History: Artery Disease, Peripheral Arterial Disease, Peripheral Venous Clustered Wound: Yes Disease, Hepatitis C, Osteoarthritis, Neuropathy, Received Chemotherapy Photos FRANDY, BASNETT ([id number]) [id number] Page 15 of 16 Wound Measurements Length: (cm) 1.5 Width: (cm) 1.7 Depth: (cm) 0.1 Area: (cm) 2.003 Volume: (cm) 0.2 % Reduction in Area: -183.3% % Reduction in Volume: -181.7% Epithelialization: None Wound Description Classification: Full Thickness Without Exposed Support Structures Exudate Amount: Medium Exudate Type: Serous Exudate Color: amber Foul Odor After Cleansing: No Slough/Fibrino Yes Wound Bed Granulation Amount: None Present (0%) Necrotic Amount: Large (67-100%) Necrotic Quality: Adherent Slough Treatment Notes Wound #19 (Lower Leg) Wound Laterality: Left, Posterior Cleanser Soap and Water Discharge Instruction: Gently cleanse wound with antibacterial soap, rinse and pat dry prior to dressing wounds Wound Cleanser Discharge Instruction: Wash your hands with soap and  water. Remove old dressing, discard into plastic bag and place into trash. Cleanse the wound with Wound Cleanser prior to applying a clean dressing using gauze sponges, not tissues or cotton balls. Do not scrub or use excessive force. Pat dry using gauze sponges, not tissue or cotton balls. Peri-Wound Care Topical Primary Dressing Silvercel Small 2x2 (in/in) Discharge Instruction: Apply Silvercel Small 2x2 (in/in) as instructed Secondary Dressing ABD Pad 5x9 (in/in) Discharge Instruction: Cover with ABD pad Secured With ACE WRAP - 62M ACE Elastic Bandage With VELCRO Brand Closure, 4 (in) Compression Wrap Compression Stockings Add-Ons Electronic Signature(s) Signed: 02/12/2023 4:23:38 PM By: Midge Aver MSN RN CNS WTA Entered By: Midge Aver on 02/12/2023 10:29:27 -------------------------------------------------------------------------------- Vitals Details Patient Name: Date of Service: [name] 02/12/2023 9:15 A M Medical Record Number: [id number] Patient Account Number: [id number] Date of Birth/Sex: Treating RN: [birth year] (62 y.o. [name] Primary Care Krew Hortman: [name] Other Clinician: Referring Pheng Prokop: Treating Marian Meneely/Extender: [name] [place], [name] ([id number]) [id number] Page 16 of 16 Weeks in Treatment: 61 Vital Signs Time Taken: 09:48 Temperature (F): 98.0 Height (in): 69 Pulse (bpm): 92 Weight (lbs): 150 Respiratory Rate (breaths/min): 18 Body Mass Index (BMI): 22.1 Blood Pressure (mmHg): 117/78 Reference Range: 80 - 120 mg / dl Electronic Signature(s) Signed: 02/12/2023 1:53:15 PM By: Midge Aver MSN RN CNS WTA Entered By: Midge Aver on 02/12/2023 13:53:15

## 2023-02-19 ENCOUNTER — Encounter: Payer: Medicaid Other | Admitting: Internal Medicine

## 2023-02-19 DIAGNOSIS — I87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 hypertension (idiopathic) with ulcer and inflammation of left lower extremity: Secondary | ICD-10-CM | POA: Diagnosis not present

## 2023-02-19 NOTE — Progress Notes (Signed)
JERAME, HEDDING ([id number]) [id number] Page 1 of 16 Visit Report for 02/19/2023 Arrival Information Details Patient Name: Date of Service: Chad [name], Chad [name] 02/19/2023 9:15 A M Medical Record Number: [id number] Patient Account Number: [id number] Date of Birth/Sex: Treating RN: Feb 15, 1961 (62 y.o. Chad [name] Primary Care Chad [name]: [name] Other Clinician: Referring Chad [name]: Treating Chad [name]/Extender: Chad BSO Dorris Carnes, MICHA EL Rubye Oaks in Treatment: 37 Visit Information History Since Last Visit Added or deleted any medications: No Patient Arrived: Wheel Chair Any new allergies or adverse reactions: No Arrival Time: 09:47 Has Dressing in Place as Prescribed: Yes Accompanied By: Self Pain Present Now: No Transfer Assistance: None Patient Requires Transmission-Based No Precautions: Patient Has Alerts: Yes Patient Alerts: Patient on Blood Thinner NOT diabetic aspirin 81mg  Lives [place] [facility] ABI R 0.81 11/05/22 ABI L 0.59 11/05/22 Electronic Signature(s) Signed: 02/19/2023 11:24:27 AM By: Midge Aver MSN RN CNS WTA Entered By: Midge Aver on 02/19/2023 11:24:27 -------------------------------------------------------------------------------- Clinic Level of Care Assessment Details Patient Name: Date of Service: ZAYON, TRULSON 02/19/2023 9:15 A M Medical Record Number: [id number] Patient Account Number: [id number] Date of Birth/Sex: Treating RN: 1961-05-26 (62 y.o. Chad [name] Primary Care Chad [name]: [name] Other Clinician: Referring Chad [name]: Treating Chad [name]/Extender: Chad BSO N, MICHA EL Clovis Cao Weeks in Treatment: 62 Clinic Level of Care Assessment Items TOOL 4 Quantity Score X- 1 0 Use when only an EandM is performed on FOLLOW-UP visit ASSESSMENTS - Nursing Assessment / Reassessment X- 1 10 Reassessment of Co-morbidities (includes updates in patient status) X- 1 5 Reassessment of Adherence to Treatment  Plan Chad [name], Chad [name] ([id number]) [id number] Page 2 of 16 ASSESSMENTS - Wound and Skin A ssessment / Reassessment []  - 0 Simple Wound Assessment / Reassessment - one wound X- 6 5 Complex Wound Assessment / Reassessment - multiple wounds []  - 0 Dermatologic / Skin Assessment (not related to wound area) ASSESSMENTS - Focused Assessment []  - 0 Circumferential Edema Measurements - multi extremities []  - 0 Nutritional Assessment / Counseling / Intervention []  - 0 Lower Extremity Assessment (monofilament, tuning fork, pulses) []  - 0 Peripheral Arterial Disease Assessment (using hand held doppler) ASSESSMENTS - Ostomy and/or Continence Assessment and Care []  - 0 Incontinence Assessment and Management []  - 0 Ostomy Care Assessment and Management (repouching, etc.) PROCESS - Coordination of Care []  - 0 Simple Patient / Family Education for ongoing care X- 1 20 Complex (extensive) Patient / Family Education for ongoing care X- 1 10 Staff obtains Chiropractor, Records, Chad [name] Results / Process Orders est []  - 0 Staff telephones HHA, Nursing Homes / Clarify orders / etc []  - 0 Routine Transfer to another Facility (non-emergent condition) []  - 0 Routine Hospital Admission (non-emergent condition) []  - 0 New Admissions / Manufacturing engineer / Ordering NPWT Apligraf, etc. , []  - 0 Emergency Hospital Admission (emergent condition) []  - 0 Simple Discharge Coordination X- 1 15 Complex (extensive) Discharge Coordination PROCESS - Special Needs []  - 0 Pediatric / Minor Patient Management []  - 0 Isolation Patient Management []  - 0 Hearing / Language / Visual special needs []  - 0 Assessment of Community assistance (transportation, D/C planning, etc.) []  - 0 Additional assistance / Altered mentation []  - 0 Support Surface(s) Assessment (bed, cushion, seat, etc.) INTERVENTIONS - Wound Cleansing / Measurement []  - 0 Simple Wound Cleansing - one wound X- 6  5 Complex Wound Cleansing - multiple wounds X- 1 5 Wound Imaging (photographs - any number of wounds) []  - 0 Wound Tracing (instead of  photographs) []  - 0 Simple Wound Measurement - one wound X- 6 5 Complex Wound Measurement - multiple wounds INTERVENTIONS - Wound Dressings []  - 0 Small Wound Dressing one or multiple wounds X- 6 15 Medium Wound Dressing one or multiple wounds []  - 0 Large Wound Dressing one or multiple wounds []  - 0 Application of Medications - topical []  - 0 Application of Medications - injection INTERVENTIONS - Miscellaneous []  - 0 External ear exam KYCE, GING ([id number]) [id number] Page 3 of 16 []  - 0 Specimen Collection (cultures, biopsies, blood, body fluids, etc.) []  - 0 Specimen(s) / Culture(s) sent or taken to Lab for analysis []  - 0 Patient Transfer (multiple staff / Michiel Sites Lift / Similar devices) []  - 0 Simple Staple / Suture removal (25 or less) []  - 0 Complex Staple / Suture removal (26 or more) []  - 0 Hypo / Hyperglycemic Management (close monitor of Blood Glucose) []  - 0 Ankle / Brachial Index (ABI) - do not check if billed separately X- 1 5 Vital Signs Has the patient been seen at the hospital within the last three years: Yes Total Score: 250 Level Of Care: New/Established - Level 5 Electronic Signature(s) Signed: 02/19/2023 4:43:06 PM By: Midge Aver MSN RN CNS WTA Entered By: Midge Aver on 02/19/2023 10:37:44 -------------------------------------------------------------------------------- Encounter Discharge Information Details Patient Name: Date of Service: Chad [name] 02/19/2023 9:15 A M Medical Record Number: [id number] Patient Account Number: [id number] Date of Birth/Sex: Treating RN: 01/21/1961 (62 y.o. Chad [name] Primary Care Joffre Lucks: [name] Other Clinician: Referring Chad [name]: Treating Chad [name]/Extender: Chad BSO Dorris Carnes, MICHA EL Rubye Oaks in Treatment:  71 Encounter Discharge Information Items Discharge Condition: Unstable Ambulatory Status: Wheelchair Discharge Destination: Skilled Nursing Facility Telephoned: No Orders Sent: Yes Transportation: Other Accompanied By: self Schedule Follow-up Appointment: Yes Clinical Summary of Care: Electronic Signature(s) Signed: 02/19/2023 4:43:06 PM By: Midge Aver MSN RN CNS WTA Entered By: Midge Aver on 02/19/2023 10:40:49 [name] ([id number]) [id number] Page 4 of 16 -------------------------------------------------------------------------------- Lower Extremity Assessment Details Patient Name: Date of Service: BACH, ROCCHI 02/19/2023 9:15 A M Medical Record Number: [id number] Patient Account Number: [id number] Date of Birth/Sex: Treating RN: Jan 14, 1961 (62 y.o. Chad [name] Primary Care Edgerrin Correia: [name] Other Clinician: Referring Mykala Mccready: Treating Daire Okimoto/Extender: Chauncey Mann, MICHA EL Clovis Cao Weeks in Treatment: 62 Edema Assessment Assessed: [Left: Yes] [Right: Yes] [Left: Edema] [Right: :] Calf Left: Right: Point of Measurement: 34 cm From Medial Instep 39 cm 37 cm Ankle Left: Right: Point of Measurement: 12 cm From Medial Instep 25 cm 24 cm Vascular Assessment Pulses: Dorsalis Pedis Palpable: [Left:No] [Right:No] Electronic Signature(s) Signed: 02/19/2023 4:43:06 PM By: Midge Aver MSN RN CNS WTA Entered By: Midge Aver on 02/19/2023 10:36:06 -------------------------------------------------------------------------------- Multi Wound Chart Details Patient Name: Date of Service: Chad [name] 02/19/2023 9:15 A M Medical Record Number: [id number] Patient Account Number: [id number] Date of Birth/Sex: Treating RN: 1960/07/31 (62 y.o. Chad [name] Primary Care Jeanetta Alonzo: [name] Other Clinician: Referring Rc Amison: Treating Izik Bingman/Extender: Chad BSO N, MICHA EL Clovis Cao Weeks in Treatment: 62 Vital  Signs Height(in): 69 Pulse(bpm): 101 Weight(lbs): 150 Blood Pressure(mmHg): 151/100 Body Mass Index(BMI): 22.1 Temperature(F): 97.6 Respiratory Rate(breaths/min): 18 [12:Photos:] [[id number] Page 5 of 16] Left, Distal, Medial Ankle Left, Dorsal Foot Right Calcaneus Wound Location: Gradually Appeared Gradually Appeared Gradually Appeared Wounding Event: Venous Leg Ulcer Venous Leg Ulcer Pressure Ulcer Primary Etiology: Anemia, Chronic Obstructive Anemia, Chronic Obstructive Anemia, Chronic Obstructive Comorbid History: Pulmonary Disease (COPD), Coronary  Pulmonary Disease (COPD), Coronary Pulmonary Disease (COPD), Coronary Artery Disease, Peripheral Arterial Artery Disease, Peripheral Arterial Artery Disease, Peripheral Arterial Disease, Peripheral Venous Disease, Disease, Peripheral Venous Disease, Disease, Peripheral Venous Disease, Hepatitis C, Osteoarthritis, Hepatitis C, Osteoarthritis, Hepatitis C, Osteoarthritis, Neuropathy, Received Chemotherapy Neuropathy, Received Chemotherapy Neuropathy, Received Chemotherapy 07/12/2019 10/01/2022 09/26/2022 Date Acquired: 62 19 19 Weeks of Treatment: Open Open Open Wound Status: No No No Wound Recurrence: No Yes Yes Clustered Wound: 2.5x1.2x0.2 9.5x6x0.2 4x3x0.1 Measurements L x W x D (cm) 2.356 44.768 9.425 A (cm) : rea 0.471 8.954 0.942 Volume (cm) : 65.90% -1800.20% -900.50% % Reduction in Area: 65.90% -3694.10% -401.10% % Reduction in Volume: Full Thickness Without Exposed Full Thickness Without Exposed Category/Stage III Classification: Support Structures Support Structures Medium Medium Medium Exudate A mount: Serosanguineous Serosanguineous Serosanguineous Exudate Type: red, brown red, brown red, brown Exudate Color: Large (67-100%) Small (1-33%) Small (1-33%) Granulation Amount: Pink Pink Red, Pink Granulation Quality: Small (1-33%) Large (67-100%) Large (67-100%) Necrotic  Amount: Adherent Slough Eschar, Adherent Slough Eschar, Adherent Slough Necrotic Tissue: Fat Layer (Subcutaneous Tissue): Yes Fat Layer (Subcutaneous Tissue): Yes Fat Layer (Subcutaneous Tissue): Yes Exposed Structures: Fascia: No Fascia: No Fascia: No Tendon: No Tendon: No Tendon: No Muscle: No Muscle: No Muscle: No Joint: No Bone: No Joint: No Bone: No Bone: No N/A None None Epithelialization: Wound Number: 17 18 19  Photos: Left, Anterior Lower Leg Right, Medial Chad [name] Second oe Left, Posterior Lower Leg Wound Location: Pressure Injury Pressure Injury Gradually Appeared Wounding Event: Pressure Ulcer Pressure Ulcer Venous Leg Ulcer Primary Etiology: Anemia, Chronic Obstructive Anemia, Chronic Obstructive Anemia, Chronic Obstructive Comorbid History: Pulmonary Disease (COPD), Coronary Pulmonary Disease (COPD), Coronary Pulmonary Disease (COPD), Coronary Artery Disease, Peripheral Arterial Artery Disease, Peripheral Arterial Artery Disease, Peripheral Arterial Disease, Peripheral Venous Disease, Disease, Peripheral Venous Disease, Disease, Peripheral Venous Disease, Hepatitis C, Osteoarthritis, Hepatitis C, Osteoarthritis, Hepatitis C, Osteoarthritis, Neuropathy, Received Chemotherapy Neuropathy, Received Chemotherapy Neuropathy, Received Chemotherapy 09/30/2022 09/26/2022 11/10/2022 Date Acquired: 18 18 14  Weeks of Treatment: Open Open Open Wound Status: No No No Wound Recurrence: No No Yes Clustered Wound: 4.5x2x0.1 1.5x1x0.1 2.5x2x0.2 Measurements L x W x D (cm) 7.069 1.178 3.927 A (cm) : rea 0.707 0.118 0.785 Volume (cm) : -71.50% -114.20% -455.40% % Reduction in Area: -71.60% -114.50% -1005.60% % Reduction in Volume: Category/Stage III Category/Stage III Full Thickness Without Exposed Classification: Support Structures Medium Medium Medium Exudate A mount: Serosanguineous Serosanguineous Serous Exudate Type: red, brown red, brown amber Exudate  Color: Large (67-100%) Small (1-33%) Small (1-33%) Granulation Amount: Red, Pink Red, Pink Red, Pink Granulation Quality: Small (1-33%) Medium (34-66%) Large (67-100%) Necrotic Amount: Adherent Slough Adherent [facility] Necrotic Tissue: N/A Fat Layer (Subcutaneous Tissue): Yes Fat Layer (Subcutaneous Tissue): Yes Exposed Structures: Fascia: No Fascia: No Tendon: No Tendon: No Muscle: No Muscle: No Joint: No Joint: No Bone: No Bone: No None None None Epithelialization: Treatment Notes Chad [name], Chad [name] ([id number]) [id number] Page 6 of 16 Electronic Signature(s) Signed: 02/19/2023 4:43:06 PM By: Midge Aver MSN RN CNS WTA Entered By: Midge Aver on 02/19/2023 10:36:14 -------------------------------------------------------------------------------- Multi-Disciplinary Care Plan Details Patient Name: Date of Service: Chad [name], Chad [name] 02/19/2023 9:15 A M Medical Record Number: [id number] Patient Account Number: [id number] Date of Birth/Sex: Treating RN: May 30, 1961 (62 y.o. Chad [name] Primary Care Nikya Busler: [name] Other Clinician: Referring Emily Massar: Treating Mcihael Hinderman/Extender: Chad BSO N, MICHA EL Clovis Cao Weeks in Treatment: 23 Active Inactive Venous Leg Ulcer Nursing Diagnoses: Knowledge deficit related to disease process and management Goals: Patient  will maintain optimal edema control Date Initiated: 12/06/2021 Target Resolution Date: 03/26/2023 Goal Status: Active Patient/caregiver will verbalize understanding of disease process and disease management Date Initiated: 12/06/2021 Date Inactivated: 12/19/2022 Target Resolution Date: 11/27/2022 Goal Status: Met Interventions: Assess peripheral edema status every visit. Compression as ordered Notes: Electronic Signature(s) Signed: 02/19/2023 4:43:06 PM By: Midge Aver MSN RN CNS WTA Entered By: Midge Aver on 02/19/2023  10:38:21 -------------------------------------------------------------------------------- Pain Assessment Details Patient Name: Date of Service: Chad [name], Chad [name] 02/19/2023 9:15 A M Medical Record Number: [id number] Patient Account Number: [id number] Date of Birth/Sex: Treating RN: 11-07-1960 (62 y.o. Chad [name] Primary Care Carley Glendenning: [name] Other Clinician: [name] ([id number]) [id number] Page 7 of 16 Referring Elgar Scoggins: Treating Sheala Dosh/Extender: Chad BSO N, MICHA EL Clovis Cao Weeks in Treatment: 62 Active Problems Location of Pain Severity and Description of Pain Patient Has Paino Yes Site Locations Pain Location: Pain in Ulcers With Dressing Change: Yes Duration of the Pain. Constant / Intermittento Constant Character of Pain Describe the Pain: Aching, Sharp, Shooting Pain Management and Medication Current Pain Management: Medication: No Electronic Signature(s) Signed: 02/19/2023 4:43:06 PM By: Midge Aver MSN RN CNS WTA Entered By: Midge Aver on 02/19/2023 10:35:56 -------------------------------------------------------------------------------- Patient/Caregiver Education Details Patient Name: Date of Service: [place], Chad [name] 7/25/2024andnbsp9:15 A M Medical Record Number: [id number] Patient Account Number: [id number] Date of Birth/Gender: Treating RN: Nov 27, 1960 (62 y.o. Chad [name] Primary Care Physician: [name] Other Clinician: Referring Physician: Treating Physician/Extender: Chad BSO Dorris Carnes, MICHA EL Rubye Oaks in Treatment: 1 Education Assessment Education Provided To: Patient Education Topics Provided Wound/Skin Impairment: Handouts: Caring for Your Ulcer Methods: Explain/Verbal Responses: State content correctly [place], [name] ([id number]) [id number] Page 8 of 16 Electronic Signature(s) Signed: 02/19/2023 4:43:06 PM By: Midge Aver MSN RN CNS WTA Entered By:  Midge Aver on 02/19/2023 10:38:35 -------------------------------------------------------------------------------- Wound Assessment Details Patient Name: Date of Service: Chad [name], Chad [name] 02/19/2023 9:15 A M Medical Record Number: [id number] Patient Account Number: [id number] Date of Birth/Sex: Treating RN: 04-Sep-1960 (62 y.o. Chad [name] Primary Care Quianna Avery: [name] Other Clinician: Referring Marguerite Barba: Treating Jakhiya Brower/Extender: Chad BSO N, MICHA EL Clovis Cao Weeks in Treatment: 62 Wound Status Wound Number: 12 Primary Venous Leg Ulcer Etiology: Wound Location: Left, Distal, Medial Ankle Wound Open Wounding Event: Gradually Appeared Status: Date Acquired: 07/12/2019 Comorbid Anemia, Chronic Obstructive Pulmonary Disease (COPD), Coronary Weeks Of Treatment: 62 History: Artery Disease, Peripheral Arterial Disease, Peripheral Venous Clustered Wound: No Disease, Hepatitis C, Osteoarthritis, Neuropathy, Received Chemotherapy Photos Wound Measurements Length: (cm) 2.5 Width: (cm) 1.2 Depth: (cm) 0.2 Area: (cm) 2.356 Volume: (cm) 0.471 % Reduction in Area: 65.9% % Reduction in Volume: 65.9% Wound Description Classification: Full Thickness Without Exposed Sup Exudate Amount: Medium Exudate Type: Serosanguineous Exudate Color: red, brown port Structures Wound Bed Granulation Amount: Large (67-100%) Exposed Structure Granulation Quality: Pink Fascia Exposed: No Necrotic Amount: Small (1-33%) Fat Layer (Subcutaneous Tissue) Exposed: Yes Necrotic Quality: Adherent Slough Tendon Exposed: No Muscle Exposed: No Joint Exposed: No Bone Exposed: No Treatment Notes Chad [name], Chad [name] ([id number]) [id number] Page 9 of 16 Wound #12 (Ankle) Wound Laterality: Left, Medial, Distal Cleanser Soap and Water Discharge Instruction: Gently cleanse wound with antibacterial soap, rinse and pat dry prior to dressing wounds Wound Cleanser Discharge  Instruction: Wash your hands with soap and water. Remove old dressing, discard into plastic bag and place into trash. Cleanse the wound with Wound Cleanser prior to applying a clean dressing using gauze sponges, not tissues or cotton balls. Do not  scrub or use excessive force. Pat dry using gauze sponges, not tissue or cotton balls. Peri-Wound Care Topical Primary Dressing Silvercel Small 2x2 (in/in) Discharge Instruction: Apply Silvercel Small 2x2 (in/in) as instructed Secondary Dressing ABD Pad 5x9 (in/in) Discharge Instruction: Cover with ABD pad Secured With ACE WRAP - 22M ACE Elastic Bandage With VELCRO Brand Closure, 4 (in) Compression Wrap Compression Stockings Add-Ons Electronic Signature(s) Signed: 02/19/2023 4:43:06 PM By: Midge Aver MSN RN CNS WTA Entered By: Midge Aver on 02/19/2023 10:13:34 -------------------------------------------------------------------------------- Wound Assessment Details Patient Name: Date of Service: Chad [name] 02/19/2023 9:15 A M Medical Record Number: [id number] Patient Account Number: [id number] Date of Birth/Sex: Treating RN: 04/01/1961 (62 y.o. Chad [name] Primary Care Marticia Reifschneider: [name] Other Clinician: Referring Lakenzie Mcclafferty: Treating Adelaide Pfefferkorn/Extender: Chad BSO N, MICHA EL Clovis Cao Weeks in Treatment: 62 Wound Status Wound Number: 15 Primary Venous Leg Ulcer Etiology: Wound Location: Left, Dorsal Foot Wound Open Wounding Event: Gradually Appeared Status: Date Acquired: 10/01/2022 Comorbid Anemia, Chronic Obstructive Pulmonary Disease (COPD), Coronary Weeks Of Treatment: 19 History: Artery Disease, Peripheral Arterial Disease, Peripheral Venous Clustered Wound: Yes Disease, Hepatitis C, Osteoarthritis, Neuropathy, Received Chemotherapy Photos Chad [name], Chad [name] ([id number]) [id number] Page 10 of 16 Wound Measurements Length: (cm) 9.5 Width: (cm) 6 Depth: (cm) 0.2 Area: (cm)  44.768 Volume: (cm) 8.954 % Reduction in Area: -1800.2% % Reduction in Volume: -3694.1% Epithelialization: None Wound Description Classification: Full Thickness Without Exposed Support Structures Exudate Amount: Medium Exudate Type: Serosanguineous Exudate Color: red, brown Foul Odor After Cleansing: No Slough/Fibrino Yes Wound Bed Granulation Amount: Small (1-33%) Exposed Structure Granulation Quality: Pink Fascia Exposed: No Necrotic Amount: Large (67-100%) Fat Layer (Subcutaneous Tissue) Exposed: Yes Necrotic Quality: Eschar, Adherent Slough Tendon Exposed: No Muscle Exposed: No Bone Exposed: No Treatment Notes Wound #15 (Foot) Wound Laterality: Dorsal, Left Cleanser Soap and Water Discharge Instruction: Gently cleanse wound with antibacterial soap, rinse and pat dry prior to dressing wounds Wound Cleanser Discharge Instruction: Wash your hands with soap and water. Remove old dressing, discard into plastic bag and place into trash. Cleanse the wound with Wound Cleanser prior to applying a clean dressing using gauze sponges, not tissues or cotton balls. Do not scrub or use excessive force. Pat dry using gauze sponges, not tissue or cotton balls. Peri-Wound Care Topical Primary Dressing Silvercel Small 2x2 (in/in) Discharge Instruction: Place in between the toes and on the foot Secondary Dressing ABD Pad 5x9 (in/in) Discharge Instruction: Cover with ABD pad Secured With ACE WRAP - 22M ACE Elastic Bandage With VELCRO Brand Closure, 4 (in) Compression Wrap Compression Stockings Add-Ons Electronic Signature(s) Signed: 02/19/2023 4:43:06 PM By: Midge Aver MSN RN CNS WTA Entered By: Midge Aver on 02/19/2023 10:14:19 [name] ([id number]) [id number] Page 11 of 16 -------------------------------------------------------------------------------- Wound Assessment Details Patient Name: Date of Service: Chad [name], Chad [name] 02/19/2023 9:15 A  M Medical Record Number: [id number] Patient Account Number: [id number] Date of Birth/Sex: Treating RN: [birth year] (62 y.o. Chad [name] Primary Care Jaelon Gatley: [name] Other Clinician: Referring Xitlali Kastens: Treating Melvenia Favela/Extender: Chad BSO N, MICHA EL Clovis Cao Weeks in Treatment: 62 Wound Status Wound Number: 16 Primary Pressure Ulcer Etiology: Wound Location: Right Calcaneus Wound Open Wounding Event: Gradually Appeared Status: Date Acquired: 09/26/2022 Comorbid Anemia, Chronic Obstructive Pulmonary Disease (COPD), Coronary Weeks Of Treatment: 19 History: Artery Disease, Peripheral Arterial Disease, Peripheral Venous Clustered Wound: Yes Disease, Hepatitis C, Osteoarthritis, Neuropathy, Received Chemotherapy Photos Wound Measurements Length: (cm) 4 Width: (cm) 3 Depth: (cm) 0.1 Area: (cm) 9.425 Volume: (cm) 0.942 %  Reduction in Area: -900.5% % Reduction in Volume: -401.1% Epithelialization: None Wound Description Classification: Category/Stage III Exudate Amount: Medium Exudate Type: Serosanguineous Exudate Color: red, brown Slough/Fibrino Yes Wound Bed Granulation Amount: Small (1-33%) Exposed Structure Granulation Quality: Red, Pink Fascia Exposed: No Necrotic Amount: Large (67-100%) Fat Layer (Subcutaneous Tissue) Exposed: Yes Necrotic Quality: Eschar, Adherent Slough Tendon Exposed: No Muscle Exposed: No Joint Exposed: No Bone Exposed: No Treatment Notes Wound #16 (Calcaneus) Wound Laterality: Right Cleanser Soap and Water Discharge Instruction: Gently cleanse wound with antibacterial soap, rinse and pat dry prior to dressing wounds [name] ([id number]) [id number] Page 12 of 16 Wound Cleanser Discharge Instruction: Wash your hands with soap and water. Remove old dressing, discard into plastic bag and place into trash. Cleanse the wound with Wound Cleanser prior to applying a clean dressing using gauze sponges,  not tissues or cotton balls. Do not scrub or use excessive force. Pat dry using gauze sponges, not tissue or cotton balls. Peri-Wound Care Topical Primary Dressing Silvercel Small 2x2 (in/in) Discharge Instruction: Apply Silvercel Small 2x2 (in/in) as instructed Secondary Dressing ABD Pad 5x9 (in/in) Discharge Instruction: Cover with ABD pad Secured With ACE WRAP - 39M ACE Elastic Bandage With VELCRO Brand Closure, 4 (in) Heel cup Discharge Instruction: May reuse over ABD Compression Wrap Compression Stockings Add-Ons Electronic Signature(s) Signed: 02/19/2023 4:43:06 PM By: Midge Aver MSN RN CNS WTA Entered By: Midge Aver on 02/19/2023 10:14:58 -------------------------------------------------------------------------------- Wound Assessment Details Patient Name: Date of Service: Chad [name] 02/19/2023 9:15 A M Medical Record Number: [id number] Patient Account Number: [id number] Date of Birth/Sex: Treating RN: 02/19/1961 (62 y.o. Chad [name] Primary Care Emori Mumme: [name] Other Clinician: Referring Shantrice Rodenberg: Treating Jaidin Richison/Extender: Chad BSO N, MICHA EL Clovis Cao Weeks in Treatment: 62 Wound Status Wound Number: 17 Primary Pressure Ulcer Etiology: Wound Location: Left, Anterior Lower Leg Wound Open Wounding Event: Pressure Injury Status: Date Acquired: 09/30/2022 Comorbid Anemia, Chronic Obstructive Pulmonary Disease (COPD), Coronary Weeks Of Treatment: 18 History: Artery Disease, Peripheral Arterial Disease, Peripheral Venous Clustered Wound: No Disease, Hepatitis C, Osteoarthritis, Neuropathy, Received Chemotherapy Photos LYNDLE, PANG ([id number]) [id number] Page 13 of 16 Wound Measurements Length: (cm) 4.5 Width: (cm) 2 Depth: (cm) 0.1 Area: (cm) 7.069 Volume: (cm) 0.707 % Reduction in Area: -71.5% % Reduction in Volume: -71.6% Epithelialization: None Tunneling: No Undermining: No Wound  Description Classification: Category/Stage III Exudate Amount: Medium Exudate Type: Serosanguineous Exudate Color: red, brown Foul Odor After Cleansing: No Slough/Fibrino Yes Wound Bed Granulation Amount: Large (67-100%) Granulation Quality: Red, Pink Necrotic Amount: Small (1-33%) Necrotic Quality: Adherent Slough Treatment Notes Wound #17 (Lower Leg) Wound Laterality: Left, Anterior Cleanser Soap and Water Discharge Instruction: Gently cleanse wound with antibacterial soap, rinse and pat dry prior to dressing wounds Wound Cleanser Discharge Instruction: Wash your hands with soap and water. Remove old dressing, discard into plastic bag and place into trash. Cleanse the wound with Wound Cleanser prior to applying a clean dressing using gauze sponges, not tissues or cotton balls. Do not scrub or use excessive force. Pat dry using gauze sponges, not tissue or cotton balls. Peri-Wound Care Topical Primary Dressing Silvercel Small 2x2 (in/in) Discharge Instruction: Apply Silvercel Small 2x2 (in/in) as instructed Secondary Dressing ABD Pad 5x9 (in/in) Discharge Instruction: Cover with ABD pad Secured With ACE WRAP - 39M ACE Elastic Bandage With VELCRO Brand Closure, 4 (in) Compression Wrap Compression Stockings Add-Ons Electronic Signature(s) Signed: 02/19/2023 4:43:06 PM By: Midge Aver MSN RN CNS WTA Entered By: Midge Aver on 02/19/2023 10:15:45 --------------------------------------------------------------------------------  Wound Assessment Details Patient Name: Date of Service: Chad [name], TANGEMAN 02/19/2023 9:15 A M Medical Record Number: [id number] Patient Account Number: [id number] Date of Birth/Sex: Treating RN: 04-17-1961 (62 y.o. Aeneas, [place], [name] ([id number]) [id number] Page 14 of 16 Primary Care Mikisha Roseland: [name] Other Clinician: Referring Nicole Defino: Treating Simren Popson/Extender: Chauncey Mann, MICHA EL Clovis Cao Weeks in  Treatment: 62 Wound Status Wound Number: 18 Primary Pressure Ulcer Etiology: Wound Location: Right, Medial Chad [name] Second oe Wound Open Wounding Event: Pressure Injury Status: Date Acquired: 09/26/2022 Comorbid Anemia, Chronic Obstructive Pulmonary Disease (COPD), Coronary Weeks Of Treatment: 18 History: Artery Disease, Peripheral Arterial Disease, Peripheral Venous Clustered Wound: No Disease, Hepatitis C, Osteoarthritis, Neuropathy, Received Chemotherapy Photos Wound Measurements Length: (cm) 1.5 Width: (cm) 1 Depth: (cm) 0.1 Area: (cm) 1.178 Volume: (cm) 0.118 % Reduction in Area: -114.2% % Reduction in Volume: -114.5% Epithelialization: None Wound Description Classification: Category/Stage III Exudate Amount: Medium Exudate Type: Serosanguineous Exudate Color: red, brown Foul Odor After Cleansing: No Slough/Fibrino Yes Wound Bed Granulation Amount: Small (1-33%) Exposed Structure Granulation Quality: Red, Pink Fascia Exposed: No Necrotic Amount: Medium (34-66%) Fat Layer (Subcutaneous Tissue) Exposed: Yes Necrotic Quality: Adherent Slough Tendon Exposed: No Muscle Exposed: No Joint Exposed: No Bone Exposed: No Treatment Notes Wound #18 (Toe Second) Wound Laterality: Right, Medial Cleanser Soap and Water Discharge Instruction: Gently cleanse wound with antibacterial soap, rinse and pat dry prior to dressing wounds Wound Cleanser Discharge Instruction: Wash your hands with soap and water. Remove old dressing, discard into plastic bag and place into trash. Cleanse the wound with Wound Cleanser prior to applying a clean dressing using gauze sponges, not tissues or cotton balls. Do not scrub or use excessive force. Pat dry using gauze sponges, not tissue or cotton balls. Peri-Wound Care Topical Primary Dressing Silvercel Small 2x2 (in/in) Discharge Instruction: Apply Silvercel Small 2x2 (in/in) as instructed Secondary Dressing Coverlet Latex-Free Fabric Adhesive  Dressings Discharge Instruction: Knuckle Secured With SALIK, GREWELL ([id number]) [id number] Page 15 of 16 Compression Wrap Compression Stockings Add-Ons Electronic Signature(s) Signed: 02/19/2023 4:43:06 PM By: Midge Aver MSN RN CNS WTA Entered By: Midge Aver on 02/19/2023 10:16:37 -------------------------------------------------------------------------------- Wound Assessment Details Patient Name: Date of Service: ZACHERY, NISWANDER 02/19/2023 9:15 A M Medical Record Number: [id number] Patient Account Number: [id number] Date of Birth/Sex: Treating RN: 12/07/1960 (62 y.o. Chad [name] Primary Care Kailer Heindel: [name] Other Clinician: Referring Radek Carnero: Treating Navid Lenzen/Extender: Chad BSO N, MICHA EL Clovis Cao Weeks in Treatment: 62 Wound Status Wound Number: 19 Primary Venous Leg Ulcer Etiology: Wound Location: Left, Posterior Lower Leg Wound Open Wounding Event: Gradually Appeared Status: Date Acquired: 11/10/2022 Comorbid Anemia, Chronic Obstructive Pulmonary Disease (COPD), Coronary Weeks Of Treatment: 14 History: Artery Disease, Peripheral Arterial Disease, Peripheral Venous Clustered Wound: Yes Disease, Hepatitis C, Osteoarthritis, Neuropathy, Received Chemotherapy Photos Wound Measurements Length: (cm) 2.5 Width: (cm) 2 Depth: (cm) 0.2 Area: (cm) 3.927 Volume: (cm) 0.785 % Reduction in Area: -455.4% % Reduction in Volume: -1005.6% Epithelialization: None Wound Description Classification: Full Thickness Without Exposed Support Exudate Amount: Medium Exudate Type: Serous Exudate Color: amber Structures Foul Odor After Cleansing: No Slough/Fibrino Yes Wound Bed Granulation Amount: Small (1-33%) Exposed Structure Granulation Quality: Red, Pink Fascia Exposed: No Necrotic Amount: Large (67-100%) Fat Layer (Subcutaneous Tissue) Exposed: Yes Necrotic Quality: Adherent Slough Tendon Exposed: No [name]  ([id number]) [id number] Page 16 of 16 Muscle Exposed: No Joint Exposed: No Bone Exposed: No Treatment Notes Wound #19 (Lower Leg) Wound Laterality: Left, Posterior Cleanser Soap and  Water Discharge Instruction: Gently cleanse wound with antibacterial soap, rinse and pat dry prior to dressing wounds Wound Cleanser Discharge Instruction: Wash your hands with soap and water. Remove old dressing, discard into plastic bag and place into trash. Cleanse the wound with Wound Cleanser prior to applying a clean dressing using gauze sponges, not tissues or cotton balls. Do not scrub or use excessive force. Pat dry using gauze sponges, not tissue or cotton balls. Peri-Wound Care Topical Primary Dressing Silvercel Small 2x2 (in/in) Discharge Instruction: Apply Silvercel Small 2x2 (in/in) as instructed Secondary Dressing ABD Pad 5x9 (in/in) Discharge Instruction: Cover with ABD pad Secured With ACE WRAP - 59M ACE Elastic Bandage With VELCRO Brand Closure, 4 (in) Compression Wrap Compression Stockings Add-Ons Electronic Signature(s) Signed: 02/19/2023 4:43:06 PM By: Midge Aver MSN RN CNS WTA Entered By: Midge Aver on 02/19/2023 10:17:22 -------------------------------------------------------------------------------- Vitals Details Patient Name: Date of Service: Chad [name] 02/19/2023 9:15 A M Medical Record Number: [id number] Patient Account Number: [id number] Date of Birth/Sex: Treating RN: 1960-10-31 (62 y.o. Chad [name] Primary Care Daxx Tiggs: [name] Other Clinician: Referring Sophiagrace Benbrook: Treating Soriyah Osberg/Extender: Chad BSO N, MICHA EL Clovis Cao Weeks in Treatment: 81 Vital Signs Time Taken: 09:53 Temperature (F): 97.6 Height (in): 69 Pulse (bpm): 101 Weight (lbs): 150 Respiratory Rate (breaths/min): 18 Body Mass Index (BMI): 22.1 Blood Pressure (mmHg): 151/100 Reference Range: 80 - 120 mg / dl Electronic Signature(s) Signed:  02/19/2023 11:24:30 AM By: Midge Aver MSN RN CNS WTA Entered By: Midge Aver on 02/19/2023 11:24:30

## 2023-02-20 NOTE — Progress Notes (Signed)
Chad [name], Chad [name] ([id number]) [id number] Page 1 of 15 Visit Report for 02/19/2023 HPI Details Patient Name: Date of Service: Chad [name], Chad [name] 02/19/2023 9:15 A M Medical Record Number: [id number] Patient Account Number: [id number] Date of Birth/Sex: Treating RN: Sep 30, 1960 (62 y.o. [name] Primary Care Provider: [name] Other Clinician: Referring Provider: Treating Provider/Extender: Chauncey Mann, Chad EL Clovis Cao Weeks in Treatment: 31 History of Present Illness HPI Description: 10/08/18 on evaluation today patient actually presents to our office for initial evaluation concerning wounds that he has of the bilateral lower extremities. 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 He also has bilateral venous stasis. He apparently has been homeless currently following discharge from the hospital apparently he has been placed at [facility] which is is a skilled nursing facility locally. Nonetheless fortunately he does not show any signs of infection at this time which is good news. In fact several of the wound actually appears to be showing some signs of improvement already in my pinion. There are a couple areas in the left leg in particular there likely gonna require some sharp debridement to help clear away some necrotic tissue and help with more sufficient healing. No fevers, chills, nausea, or vomiting noted at this time. 10/15/18 on evaluation today patient actually appears to be doing very well in regard to his bilateral lower extremities. He's been tolerating the dressing changes without complication. Fortunately there does not appear to be any evidence of active infection at this time which is great news. Overall I'm actually very pleased with how this has progressed in just one visits  time. Readmission: 08/14/2020 upon evaluation today patient presents for re-evaluation here in our clinic. He is having issues with his left ankle region as well as his right toe and his right heel. He tells me that the toe and heel actually began as a area that was itching that he was scratching and then subsequently opened up into wounds. These may have been abscess areas I presume based on what I am seeing currently. With regard to his left ankle region he tells me this was a similar type occurrence although he does have venous stasis this very well may be more of a venous leg ulcer more than anything. Nonetheless I do believe that the patient would benefit from appropriate and aggressive wound care to try to help get things under better control here. He does have history of a stroke on the left side affecting him to some degree there that he is able to stand although he does have some residual weakness. Otherwise again the patient does have chronic venous insufficiency as previously noted. His arterial studies most recently obtained showed that he had an ABI on the right of 1.16 with a TBI of 0.52 and on the left and ABI of 1.14 with a TBI of 0.81. That was obtained on 06/19/2020. 08/28/2020 upon evaluation today patient appears to be doing decently well in regard to his wounds in general. He has been tolerating the dressing changes without complication. Fortunately there does not appear to be any signs of active infection which is great news. With that being said I think the [facility] is doing a good job I would recommend that we likely continue with that currently. 09/11/2020 upon evaluation today patient's wounds did not appear to be doing too poorly but  again he is not really showing signs of significant improvement with regard to any of the wounds on the right. None of them have Hydrofera Blue on them I am not exactly sure why this is not being followed as the facility did not contact us to  let us know of any issues with obtaining dressings or otherwise. With that being said he is supposed to be using Hydrofera Blue on both of the wounds on the right foot as well as the ankle wound on the left side. 09/18/2020 upon evaluation today patient appears to be doing poorly with regard to his wounds. Again right now the left ankle in particular showed signs of extreme maceration. Apparently he was told by someone with staff at [facility] they could not get the [facility]. With that being said this is something that is never been relayed to [place] one way or another. Also the patient subsequently has not supposed to have a border gauze dressing on. He should have an ABD pad and roll gauze to secure as this drains much too much just to have a border gauze dressing to cover. Nonetheless the fact that they are not using the appropriate dressing is directly causing deterioration of the left ankle wound it is significantly worse today compared to what it was previous. I did attempt to call [facility] while the patient was here I called three times and got no one to even pick up the phone. After this I had my for an office coordinator call and she was able to finally get through and leave a message with the D ON as of dictation of this note which is roughly about an hour and a half later I still have not been able to speak with anyone at the facility. 09/25/2020 upon evaluation today patient actually showing signs of good improvement which is excellent news. He has been tolerating the dressing changes without complication. Fortunately there is no signs of active infection which is great news. No fevers, chills, nausea, vomiting, or diarrhea. I do feel like the facility has been doing a much better job at taking care of him as far as the dressings are concerned. However the director of nursing never did call me back. 10/09/2020 upon evaluation today patient appears to be doing well with  regard to his wound. The toe ulcer did require some debridement but the other 2 areas actually appear to be doing quite well. 10/19/2020 upon evaluation today patient actually appears to be doing very well in regard to his wounds. In fact the heel does appear to be completely healed. The toe is doing better in the medial ankle on the left is also doing better. Overall I think he is headed in the right direction. 10/26/2020 upon evaluation today patient appears to be doing well with regard to his wound. He is showing signs of improvement which is great news and overall I am very pleased with where things stand today. No fevers, chills, nausea, vomiting, or diarrhea. 11/02/2020 upon evaluation today patient appears to be doing well with regard to his wounds. He has been tolerating the dressing changes without complication overall I am extremely pleased with where things stand today. He in regard to the toe is almost completely healed and the medial ankle on the left is doing much better. Chad [name], Chad [name] ([id number]) [id number] Page 2 of 15 11/09/2020 upon evaluation today patient appears to be doing a little poorly in regard to his left medial ankle ulcer. Fortunately there  does not appear to be any signs of systemic infection but unfortunately locally he does appear to be infected in fact he has blue-green drainage consistent with Pseudomonas. 11/16/2020 upon evaluation today patient appears to be doing well with regard to his wound. It actually appears to be doing better. I did place him on gentamicin cream since the Cipro was actually resistant even though he was positive for Pseudomonas on culture. Overall I think that he does seem to be doing better though I am unsure whether or not they have actually been putting the cream on. The patient is not sure that we did talk to the Chad [name] directly and she was going to initiate that treatment. Fortunately there does not appear to be  any signs of active infection at this time. No fevers, chills, nausea, vomiting, or diarrhea. 4/28; the area on the right second toe is close to healed. Left medial ankle required debridement 12/07/2020 upon evaluation today patient appears to be doing well with regard to his wounds. In fact the right second toe appears to be completely healed which is great news. Fortunately there does not appear to be any signs of active infection at this time which is also great news. I think we can probably discontinue the gentamicin on top of everything else. 12/14/2020 upon evaluation today patient appears to be doing well with regard to his wound. He is making good progress and overall very pleased with where things stand today. There is no signs of active infection at this time which is great news. 12/28/2020 upon evaluation today patient appears to be doing well with regard to his wounds. He has been tolerating the dressing changes without complication. Fortunately there is no signs of active infection at this time. No fevers, chills, nausea, vomiting, or diarrhea. 12/28/2020 upon evaluation today patient's wound bed actually showed signs of excellent improvement. He has great epithelization and granulation I do not see any signs of infection overall I am extremely pleased with where things stand at this point. No fevers, chills, nausea, vomiting, or diarrhea. 01/11/2021 upon evaluation today patient appears to be doing well with regard to his wound on his leg. He has been tolerating the dressing changes without complication. Fortunately there does not appear to be any signs of active infection which is great news. No fevers, chills, nausea, vomiting, or diarrhea. 01/25/2021 upon evaluation today patient appears to be doing well with regard to his wound. He has been tolerating the dressing changes without complication. Fortunately the collagen seems to be doing a great job which is excellent news. No fevers, chills,  nausea, vomiting, or diarrhea. 02/08/2021 upon evaluation today patient's wound is actually looking a little bit worse especially in the periwound compared to previous. Fortunately there does not appear to be any signs of infection which is great news with that being said he does have some irritation around the periphery of the wound which has me more concerned. He actually had a dressing on that had not been changed in 3 days. He also is supposed to have daily dressing changes. With regard to the dressing applied he had a silver alginate dressing and silver collagen is what is recommended and ordered. He also had no Desitin around the edges of the wound in the periwound region although that is on the order inspect to be done as well. In general I was very concerned I did contact [facility] actually spoke with Verlon Au who is the scheduling individual and subsequently she stated that  she would pass the information to the D ON apparently the D ON was not available to talk to me when I call today. 02/18/2021 upon evaluation today patient's wound is actually showing signs of improvement. Fortunately there does not appear to be any evidence of infection which is great news overall I am extremely pleased with where things stand today. No fevers, chills, nausea, vomiting, or diarrhea. 8/3; patient presents for 1 week follow-up. He has no issues or complaints today. He denies signs of infection. 03/11/2021 upon evaluation today patient appears to be doing well with regard to his wound. He does have a little bit of slough noted on the surface of the wound but fortunately there does not appear to be any signs of active infection at this time. No fevers, chills, nausea, vomiting, or diarrhea. 03/18/2021 upon evaluation today patient appears to be doing well with regard to his wound. He has been tolerating the dressing changes without complication. There was a little irritation more proximal to where the wound  was that was not noted last week but nonetheless this is very superficial just seems to be more irritation we just need to make sure to put a good amount of the zinc over the area in my opinion. Otherwise he does not seem to be doing significantly worse at all which is great news. 03/25/2021 upon evaluation today patient appears to be doing well with regard to his wound. He is going require some sharp debridement today to clear with some of the necrotic debris. I did perform this today without complication postdebridement wound bed appears to be doing much better this is great news. 04/08/2021 upon evaluation today patient appears to be doing decently well in regard to his wound although the overall measurement is not significantly smaller compared to previous. It is gone down a little bit but still the facility continues to not really put the appropriate dressings in place in fact he was supposed to have collagen we think he probably had more of an allergy to At this point. Fortunately there does not appear to be any signs of active infection systemically though locally I do not see anything on initial visualization either as far as erythema or warmth. 04/15/2021 upon evaluation today patient appears to be doing well with regard to his wound. He is actually showing signs of improvement. I did place him on antibiotics last week, Cipro. He has been taking that 2 times a day and seems to be tolerating it very well. I do not see any evidence of worsening and in fact the overall appearance of the wound is smaller today which is also great news. 9/26; left medial ankle chronic venous insufficiency wound is improved. Using Hydrofera Blue 10/10; left medial ankle chronic venous insufficiency. Wound has not changed much in appearance completely nonviable surface. Apparently there have been problems getting the right product on the wound at the facility although he came in with [facility] on today 05/14/2021  upon evaluation today patient appears to be doing well with regard to his wound. I think he is making progress here which is good news. Fortunately there does not appear to be any signs of active infection at this time. No fevers, chills, nausea, vomiting, or diarrhea. 05/20/2021 upon evaluation today patient appears to be doing well with regard to his wound. He is showing signs of good improvement which is great news. There does not appear to be any evidence of active infection which is also excellent news. No  fevers, chills, nausea, vomiting, or diarrhea. 05/28/2021 upon evaluation today patient appears to be doing quite well. There does not appear to be any signs of active infection at this time which is great news. Overall I am extremely pleased with where things stand today. I think he is headed in the right direction. 06/11/2021 upon evaluation today patient appears to be doing well with regard to his left ankle ulcer and poorly in regard to the toe ulcer on the second toe right foot. This appears to show signs of joint exposure. Apparently this has been present for 1 to 2 months although he kept forgetting to tell me about it. That is unfortunate as right now it definitely appears to be doing significantly worse than what I would like to see. There does not appear to be any signs of active infection systemically though locally I am concerned about the possibility of infection the toe is quite red. Again no one from the facility ever contacted [place] to advise that this was going on in the interim either. 06/17/2021 upon evaluation today patient presents for follow-up I did review his x-ray which showed a navicular bone fracture I am unsure of the chronicity of this. Subsequently he also had osteomyelitis of the toe which was what I was more concerned about this did not show up on x-ray but did show up on the pathology scrapings. This was listed as acute osteomyelitis. Nonetheless at this point I think  that the antibiotic treatment is the best regimen to go with currently. The patient is in agreement with that plan. Nonetheless he has initially 30 days of doxycycline off likely extend that towards the end of the treatment cycle that will be around the middle of December for an additional 2 weeks. That all depends on how well he continues to heal. Nonetheless based on what I am seeing in the foot I did want a proceed with an MRI as well which I think will be helpful to identify if there is anything else that needs to be addressed from the standpoint of infection. 06/24/2021 upon evaluation today patient appears to be doing pretty well in regards to his wounds. I think both are actually showing signs of improvement which is good I did review his MRI today which did show signs of osteomyelitis of the middle and proximal phalanx on his right foot of the affected toe. With that being said this is actually showing signs of significant improvement today already with the antibiotic therapy I think the redness is also improved. Overall I Chad [name], Chad [name] ([id number]) [id number] Page 3 of 15 think that we just need to give this some time with appropriate wound care we will see how things go potentially hyperbarics could be considered. 07/02/2021 upon inspection today patient actually appears to be doing well in regard to his left ankle which is getting very close to complete resolution of pleased in that regard. Unfortunately he is continuing to have issues with his second toe right foot and this seems to still be very painful for him. Recommend he try something different from the standpoint of antibiotics. 07/15/2021 upon evaluation today patient appears to be doing actually pretty well in regard to his foot. This is actually showing signs of significant improvement which is great news. Overall I feel like the patient is improving both in regard to the second toe as well as the  ankle on the left. With that being said the biggest issue that I do see currently is that  he is needing to have a refill of the doxycycline that we previously treated him with. He also did see podiatry they are not going to recommend any amputation at this point since he seems to be doing quite well. For that reason we just need to keep things under control from an infection standpoint. 08/01/2021 upon evaluation today patient appears to be doing well with regard to his wound. He has been tolerating the dressing changes without complication. Fortunately there does not appear to be any evidence of active infection locally nor systemically at this point. In fact I think everything is doing excellent in fact his second toe on the right foot is almost healed and the ankle on the left ankle region is actually very close to being healed as well. 08/08/2021 upon evaluation today patient appears to be doing well with regard to his wound. He has been tolerating the dressing changes without complication. Fortunately I do not see any signs of active infection at this time. Readmission: 12-06-2021 upon evaluation today patient presents for reevaluation here in the clinic he does tell me that he was being seen in facility at [facility] by a provider that was coming in. He is not sure who this was. He tells me however that the wound seems to have gotten worse even compared to where it was when we last saw him at this point. With that being said I do believe that he is likely going need ongoing wound care here in the clinic and I do believe that we need to be the ones to frontline this since his wound does seem to be getting worse not better at this point. He voiced understanding. He is also in agreement with this plan and feels more comfortable coming here she tells me. Patient's medical history really has not changed since his prior admission he was only gone since January. 12-27-2021 upon evaluation today  patient appears to be doing well with regard to his wound they did run out of the [facility] so they did not put anything on just an ABD pad with gentamicin. Still we are seeing some signs of good improvement here with some new epithelization which is great news. 01-10-2022 upon evaluation today patient appears to be doing well with regard to his wounds and he is going require some sharp debridement but overall seems to be making good progress. Fortunately I do not see any evidence of active infection locally or systemically at this time which is great news. 01-24-2022 upon evaluation today patient appears to be doing well with regard to his wound. The facility actually came and dropped him off early and he had another appointment at the hospital and then they just brought him over here and this was still hours before his appointment this afternoon. For that reason we did do our best to work him in this morning and fortunately had some space to make this happen. With that being said patient's wound does seem to be making progress here and I am very pleased in that regard I do not see any signs of active infection locally or systemically at this time. 02-07-2022 patient appears to be doing well currently in regard to his wounds. In fact one of them the more proximal is healed the distal is still open but seems to be doing excellent. Fortunately I do not see any evidence of active infection locally or systemically at this time which is great news. No fevers, chills, nausea, vomiting, or diarrhea. 7/27;  left medial ankle venous. Improving per our intake Chad [name]. We are using Hydrofera Blue under Tubigrip compression. They are changing that at his facility 03-06-2022 upon evaluation today patient appears to be doing well with regard to the his wound he is can require some sharp debridement but seems to be making excellent progress. Fortunately I do not see any evidence of active infection locally or  systemically which is great news. 03-27-2022 upon evaluation today patient's wound is actually showing signs of excellent improvement. Fortunately I see no signs of active infection locally or systemically at this time which is great news. No fevers, chills, nausea, vomiting, or diarrhea. 04-21-2022 upon evaluation today patient appears to be doing excellent in regard to his wound in fact this is very close to resolution based on what I am seeing. I do not see any evidence of active infection locally or systemically at this time which is great news and overall I am extremely pleased with where we are today. 05-05-2022 upon evaluation today patient's wound actually appears to potentially be completely healed. Fortunately I do not see any evidence of active infection at this time which is great news and overall I am very pleased I think this needs a little time to toughen up but other than that I really do believe were doing quite well. He is very pleased to hear this its been a long time coming. 05-19-2022 upon evaluation today patient appears to be doing well currently in regard to his wound. He in fact we were hoping will be completely healed and closed but it still has a very tiny area right in the center which is still continuing to drain. Fortunately I do not see any signs of infection locally or systemically which is great news. 11/6; this wound is close to completely" he comes in this week with some erythema at 1 edge of this which looks like something was rubbing on the area. Other than that no evidence of infection 06-16-2022 upon evaluation today patient appears to be doing well currently in regard to his ankle ulcer. This is very close to being healed but still has a small area in the central portion which is not. Fortunately there does not appear to be any signs of infection locally or systemically which is great news. No fevers, chills, nausea, vomiting, or diarrhea. 06-30-2022 upon  evaluation patient's wound pretty much appears to be almost completely closed. In fact it may even be closed but there are still a lot of skin irritation around the edges of the wound and I just do not feel great about releasing them yet I would like to continue to monitor this just a little bit longer before getting to that point. He is not opposed to this and in fact is happy to continue to come in if need be in order to make sure that things are moving in the right direction he definitely does not want to backtrack. 07-14-2022 upon evaluation today patient appears to be doing well currently in regard to his wound. Has been tolerating the dressing changes without complication. Fortunately I see no evidence of active infection locally nor systemically which is great news. No fevers, chills, nausea, vomiting, or diarrhea. 08-11-2022 upon evaluation today patient appears to be doing a little worse than last time I saw him although it has been a month. Unfortunately he was not able to be seen due to the fact that he was quarantined in the facility with COVID. Subsequently he tells me that  he mention to the facility staff several times about taking it off over the past several weeks but they continue to tell him that someone have to get in touch with [place] here although nobody ever did until Friday when the Chad [name] manager called to have that documented in the communication notes. With that being said unfortunately this means that he had a wrap on that he was supposed to have on for only 1 week for ended up being on four 1 month essentially. I am glad that there is nothing worse than what we see currently to be perfectly honest. 08-19-2022 upon evaluation today patient appears to be doing well currently in regard to his wounds. He has been tolerating the dressing changes without complication. This is actually smaller today which is great news after last week's reopening. Fortunately I do not see any evidence of  infection locally nor systemically at this point. 08-26-2022 upon evaluation today patient appears to be doing well currently in regard to his wound. He has been tolerating the dressing changes without complication. Fortunately there does not appear to be any signs of active infection locally nor systemically which is great news. No fevers, chills, nausea, Chad [name], Chad [name] ([id number]) [id number] Page 4 of 15 vomiting, or diarrhea. 09-02-2022 upon evaluation today patient's wound actually showing signs of excellent improvement this is measuring smaller and looking much better. I am very pleased with where we stand I do believe that we are headed in the right direction. 09-09-2022 upon evaluation today patient appears to be doing decently well in regard to his leg in general although there was some swelling this is the 1 thing that I am not too happy with based on what I see currently. Fortunately there does not appear to be any signs of infection locally nor systemically at this point. 09-16-2022 upon evaluation today patient appears to be doing well currently in regard to his wound. He is actually showing signs of improvement he wore the Tubigrip this week and it significantly better compared to last week's evaluation. Fortunately I do not see any evidence of active infection locally or systemically which is great news. Overall I think that were headed in the right direction which is great news. In fact overall I think this is the best that he has looked in some time. He does not like the Tubigrip but actually did extremely well with this in my opinion. I think he needs to continue to utilize this. 09-23-2022 upon evaluation today patient appears to be doing well currently in regard to his wound. He has been tolerating the dressing changes without complication. Fortunately there does not appear to be any signs of active infection locally nor systemically which is great news and  in general I do feel like that we are headed in the right direction. He has been having trouble with the Tubigrip however we have gotten the facility to order compression socks he has not gotten them as of yet. 3/4; patient is about the same with a wound on the left posterior leg. This is a venous wound. He needs more compression on this leg but he took off the previous wraps we are using. I am not certain whether he is using juxta lite stockings at the nursing home. 3/12; patient presents for follow-up. He unfortunately has 2 new wounds 1 to the dorsal left side and the other to the right heel. He is not sure how these happened. The right heel appears to be caused by pressure. He  has been using Hydrofera Blue to the original wound on the left leg. 10-14-2022 upon evaluation today patient appears to be doing well currently in regard to his original wound but unfortunately he has several new wounds which I was completely unaware of before walking into the room to see him today. Unfortunately he seems to not be doing nearly as well as what I saw when I last had an appointment with him 3 weeks ago. Fortunately I do not see any signs of systemic infection though unfortunately locally he has definite infection of the left lower extremity. He is currently on doxycycline I am going to likely have to extend that today. 10-21-2022 upon evaluation today patient appears to be doing well currently in regard to his wounds. He seems to be tolerating the dressing changes without complication. Fortunately there does not appear to be any signs of active infection locally or systemically which is great news and in general I definitely feel like we are on the right track here. 10-30-2022 upon evaluation today patient still continues to not be doing too well at all with regard to his wounds. Unfortunately he is still having signs of infection as well which also has been concerned. Fortunately I do not see any signs of  active infection locally nor systemically at this time. No fevers, chills, nausea, vomiting, or diarrhea. 11-06-2022 upon evaluation today patient appears to be doing okay currently in regard to his wounds. All things considered with his blood flow not being nearly as good as what should I think that he is making pretty good progress here. Fortunately I do not see any signs of infection locally nor systemically which is great news. No fevers, chills, nausea, vomiting, or diarrhea. 11-13-2022 upon evaluation today patient appears to be doing poorly currently in regard to his wounds. Things seem to be getting a little bit worse not better. 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 Subsequently he is also going to need to have vascular intervention but we are unsure exactly when this is going on the undertaking. I would try to actually send a message to Dr. Hazle Quant and the vascular team just to see what exactly the plan is going forward. The patient is in agreement with that plan I just think we need to have a plan especially in light of the fact the left leg seems to be getting a little bit worse at this point. 11-25-2022 upon evaluation today patient is currently now discharged from the hospital following his colostomy procedure. That did very well and he seems to be doing well. Fortunately there does not appear to be any signs of active infection locally or systemically which is great news. No fevers, chills, nausea, vomiting, or diarrhea. 12-04-2022 upon evaluation today patient appears to be doing actually decently well in regard to his wounds and actually very pleased with where we stand and I think that he is making some pretty good progress here. Fortunately I do not see any signs of active infection locally nor systemically which is great news. No fevers, chills, nausea, vomiting, or  diarrhea. 12-12-2022 upon evaluation today patient appears to be doing well currently in regard to his wound. He has been tolerating the dressing changes without complication. The wounds all seem to be making some progress to a degree. He has appointment with vascular on the 23rd of this month. 5/24; the patient  was scheduled for a left lower extremity angiogram yesterday but apparently they were overbooked and they wanted to admit him to hospital last night so they could do this early this morning but he refused I see that Dr. Wyn Quaker has rescheduled him for June 6. He is using silver alginate to wounds on his bilateral lower extremities. 01-02-2023 upon evaluation today patient appears to be doing poorly in regard to his wounds still. Subsequently he did have his angiogram but apparently there really was not much that was found that could be revascularized. The patient tells me that Dr. Wyn Quaker was going to contact me here in the office I have not heard from him as of yet and I may see about giving him a call just to follow-up or possibly send a message through epic which could be an option as well. 01-09-2023 upon evaluation today patient appears to be doing poorly in regard to his legs he is extremely swollen. He has been sleeping in his chair he tells me for the past week. His bed is broken and they have ordered a new one which is supposedly supposed to be there today. With that being said he the way he needs to be not sleeping in his chair that is his wheelchair every day and through the night. This is causing his legs to be much more swollen and it seen and how badly wounds are doing at this point. This has made things significantly worse. Fortunately I do not see any signs of infection right now unfortunately I do believe that the patient has a tremendous amount of drainage currently. 01-19-2023 upon evaluation today patient appears to be doing poorly in regard to his wounds of his legs his swelling is  better but still not as good as what we would like to see. Fortunately I do not see any evidence of active infection locally nor systemically which is great news and in general I do believe that we are moving in the right direction here. Nonetheless it would be better if she was in the bed sleeping or not send up in his wheelchair although he seems to still be set up in the wheelchair at this time. He tells me that he is just not as comfortable in the bed as the wheelchair but unfortunately this is causing damage to his legs which I discussed with him today as well. Nonetheless I am not sure that he is going to be amendable to doing anything different. 01-27-2023 upon evaluation today patient appears to be doing well currently. Fortunately I do not see any evidence of active infection locally nor systemically which is great news I think he is actually doing the best option and while in fact I would try to see if we can do some debridement today to get things moving along. 02-03-2023 upon evaluation today patient's wounds unfortunately are not doing nearly as well. Will be doing the [employer] wraps unfortunately we just not seeing the improvement that I would like to see. Fortunately I do not see any evidence of infection at this time which is good news unfortunately I do think that he needs to be changed on a daily basis due to the amount of drainage otherwise I do not think this is really going to continue to improve dramatically. 02-12-2023 upon evaluation today patient appears to be doing better in a lot of ways in regard to his wounds and although his toes on the left side actually seem a bit worse. Fortunately  I do not see any evidence of active infection locally nor systemically at this time which is good news. With that being said he is having some bleeding from the left leg I think that this is an area that looks like it probably got bumped. With that being said I discussed this with the  patient Chad [name], Chad [name] ([id number]) [id number] Page 5 of 15 he tells me he does not know of any trauma that occurred but at the same time if was not this that it was skin that got pulled off. Dressing change there is only one of the 2 that could have happened. 7/25; this is a patient who is very frail at [facility] nursing facility. He is nonambulatory. He has wounds on his bilateral lower legs. However much worse on the left we have been using silver cell to all of his wounds He comes in today and a lot more pain our intake Chad [name] reports deterioration of the left foot. I ended up looking through some of his record. 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 I am really not certain of the stage of this currently or who is following Electronic Signature(s) Signed: 02/20/2023 7:34:23 AM By: Baltazar Najjar MD Entered By: Baltazar Najjar on 02/19/2023 11:22:44 -------------------------------------------------------------------------------- Physical Exam Details Patient Name: Date of Service: Chad [name] 02/19/2023 9:15 A M Medical Record Number: [id number] Patient Account Number: [id number] Date of Birth/Sex: Treating RN: 10/04/1960 (62 y.o. [name] Primary Care Provider: [name] Other Clinician: Referring Provider: Treating Provider/Extender: Chad [name], Chad EL Clovis Cao Weeks in Treatment: 41 Constitutional Patient is hypertensive.. Pulse regular  and within target range for patient.Marland Kitchen Respirations regular, non-labored and within target range.. Temperature is normal and within the target range for the patient.. The patient appears uncomfortable legs contracted left greater than right.. Cardiovascular I could not feel a left popliteal pulse. Pedal pulses are not palpable in the left foot or the right foot. Notes Wound exam The patient has multiple wounds but the worst of these is on the left forefoot where is there is skin breakdown just proximal to his toes multiple wounds in between his toes the area is malodorous, wet. Very difficult to examine because of pain He also has areas on the right second toe left medial lower leg Electronic Signature(s) Signed: 02/20/2023 7:34:23 AM By: Baltazar Najjar MD Entered By: Baltazar Najjar on 02/19/2023 11:25:53 -------------------------------------------------------------------------------- Physician Orders Details Patient Name: Date of Service: Chad [name] 02/19/2023 9:15 A M Medical Record Number: [id number] Patient Account Number: [id number] Chad [name], Chad [name] ([id number]) [id number] Page 6 of 15 Date of Birth/Sex: Treating RN: [birth year] (62 y.o. [name] Primary Care Provider: Other Clinician: [name] Referring Provider: Treating Provider/Extender: Chauncey Mann, Chad [name] in Treatment: 75 Verbal / Phone Orders: No Diagnosis Coding Follow-up Appointments Return Appointment in 1 week. Chad [name] Visit as needed Bathing/ Shower/ Hygiene May shower with wound dressing protected with water repellent cover or cast protector. No tub bath. Anesthetic (Use 'Patient Medications' Section for Anesthetic Order Entry)  Lidocaine applied to wound bed Edema Control - Lymphedema / Segmental Compressive Device / Other Elevate legs to the level of the heart and pump ankles as often as possible Elevate leg(s) parallel to the floor when  sitting. Other: - PATIENT MUST SLEEP IN A BED. Legs are swelling due to no elevation of legs. Off-Loading Heel suspension boot - when in bed Medications-Please add to medication list. Other: - apply AandD ointment to leg, do not apply to wound Wound Treatment Wound #12 - Ankle Wound Laterality: Left, Medial, Distal Cleanser: Soap and Water 1 x Per Day/30 Days Discharge Instructions: Gently cleanse wound with antibacterial soap, rinse and pat dry prior to dressing wounds Cleanser: Wound Cleanser 1 x Per Day/30 Days Discharge Instructions: Wash your hands with soap and water. Remove old dressing, discard into plastic bag and place into trash. Cleanse the wound with Wound Cleanser prior to applying a clean dressing using gauze sponges, not tissues or cotton balls. Do not scrub or use excessive force. Pat dry using gauze sponges, not tissue or cotton balls. Prim Dressing: Silvercel Small 2x2 (in/in) 1 x Per Day/30 Days ary Discharge Instructions: Apply Silvercel Small 2x2 (in/in) as instructed Secondary Dressing: ABD Pad 5x9 (in/in) 1 x Per Day/30 Days Discharge Instructions: Cover with ABD pad Secured With: ACE WRAP - 9M ACE Elastic Bandage With VELCRO Brand Closure, 4 (in) 1 x Per Day/30 Days Wound #15 - Foot Wound Laterality: Dorsal, Left Cleanser: Soap and Water 1 x Per Day/30 Days Discharge Instructions: Gently cleanse wound with antibacterial soap, rinse and pat dry prior to dressing wounds Cleanser: Wound Cleanser 1 x Per Day/30 Days Discharge Instructions: Wash your hands with soap and water. Remove old dressing, discard into plastic bag and place into trash. Cleanse the wound with Wound Cleanser prior to applying a clean dressing using gauze sponges, not tissues or cotton balls. Do not scrub or use excessive force. Pat dry using gauze sponges, not tissue or cotton balls. Prim Dressing: Silvercel Small 2x2 (in/in) 1 x Per Day/30 Days ary Discharge Instructions: Place in between the  toes and on the foot Secondary Dressing: ABD Pad 5x9 (in/in) 1 x Per Day/30 Days Discharge Instructions: Cover with ABD pad Secured With: ACE WRAP - 9M ACE Elastic Bandage With VELCRO Brand Closure, 4 (in) 1 x Per Day/30 Days Wound #16 - Calcaneus Wound Laterality: Right Cleanser: Soap and Water 1 x Per Day/30 Days Discharge Instructions: Gently cleanse wound with antibacterial soap, rinse and pat dry prior to dressing wounds Cleanser: Wound Cleanser 1 x Per Day/30 Days Discharge Instructions: Wash your hands with soap and water. Remove old dressing, discard into plastic bag and place into trash. Cleanse the wound with Wound Cleanser prior to applying a clean dressing using gauze sponges, not tissues or cotton balls. Do not scrub or use excessive force. Pat dry using gauze sponges, not tissue or cotton balls. Prim Dressing: Silvercel Small 2x2 (in/in) 1 x Per Day/30 Days ary Discharge Instructions: Apply Silvercel Small 2x2 (in/in) as instructed Secondary Dressing: ABD Pad 5x9 (in/in) 1 x Per Day/30 Days Chad [name], Chad [name] ([id number]) [id number] Page 7 of 15 Discharge Instructions: Cover with ABD pad Secured With: ACE WRAP - 9M ACE Elastic Bandage With VELCRO Brand Closure, 4 (in) 1 x Per Day/30 Days Secured With: Heel cup 1 x Per Day/30 Days Discharge Instructions: May reuse over ABD Wound #17 - Lower Leg Wound Laterality: Left, Anterior Cleanser: Soap and Water 1 x Per Day/30 Days Discharge Instructions: Gently cleanse wound  with antibacterial soap, rinse and pat dry prior to dressing wounds Cleanser: Wound Cleanser 1 x Per Day/30 Days Discharge Instructions: Wash your hands with soap and water. Remove old dressing, discard into plastic bag and place into trash. Cleanse the wound with Wound Cleanser prior to applying a clean dressing using gauze sponges, not tissues or cotton balls. Do not scrub or use excessive force. Pat dry using gauze sponges, not tissue or  cotton balls. Prim Dressing: Silvercel Small 2x2 (in/in) 1 x Per Day/30 Days ary Discharge Instructions: Apply Silvercel Small 2x2 (in/in) as instructed Secondary Dressing: ABD Pad 5x9 (in/in) 1 x Per Day/30 Days Discharge Instructions: Cover with ABD pad Secured With: ACE WRAP - 31M ACE Elastic Bandage With VELCRO Brand Closure, 4 (in) 1 x Per Day/30 Days Wound #18 - T Second oe Wound Laterality: Right, Medial Cleanser: Soap and Water 1 x Per Day/30 Days Discharge Instructions: Gently cleanse wound with antibacterial soap, rinse and pat dry prior to dressing wounds Cleanser: Wound Cleanser 1 x Per Day/30 Days Discharge Instructions: Wash your hands with soap and water. Remove old dressing, discard into plastic bag and place into trash. Cleanse the wound with Wound Cleanser prior to applying a clean dressing using gauze sponges, not tissues or cotton balls. Do not scrub or use excessive force. Pat dry using gauze sponges, not tissue or cotton balls. Prim Dressing: Silvercel Small 2x2 (in/in) 1 x Per Day/30 Days ary Discharge Instructions: Apply Silvercel Small 2x2 (in/in) as instructed Secondary Dressing: Coverlet Latex-Free Fabric Adhesive Dressings 1 x Per Day/30 Days Discharge Instructions: Knuckle Wound #19 - Lower Leg Wound Laterality: Left, Posterior Cleanser: Soap and Water 1 x Per Day/30 Days Discharge Instructions: Gently cleanse wound with antibacterial soap, rinse and pat dry prior to dressing wounds Cleanser: Wound Cleanser 1 x Per Day/30 Days Discharge Instructions: Wash your hands with soap and water. Remove old dressing, discard into plastic bag and place into trash. Cleanse the wound with Wound Cleanser prior to applying a clean dressing using gauze sponges, not tissues or cotton balls. Do not scrub or use excessive force. Pat dry using gauze sponges, not tissue or cotton balls. Prim Dressing: Silvercel Small 2x2 (in/in) 1 x Per Day/30 Days ary Discharge Instructions:  Apply Silvercel Small 2x2 (in/in) as instructed Secondary Dressing: ABD Pad 5x9 (in/in) 1 x Per Day/30 Days Discharge Instructions: Cover with ABD pad Secured With: ACE WRAP - 31M ACE Elastic Bandage With VELCRO Brand Closure, 4 (in) 1 x Per Day/30 Days Electronic Signature(s) Signed: 02/19/2023 4:43:06 PM By: Midge Aver MSN RN CNS WTA Signed: 02/20/2023 7:34:23 AM By: Baltazar Najjar MD Entered By: Midge Aver on 02/19/2023 10:36:39 Chad [name] ([id number]) [id number] Page 8 of 15 -------------------------------------------------------------------------------- Problem List Details Patient Name: Date of Service: Chad [name], Chad [name] 02/19/2023 9:15 A M Medical Record Number: [id number] Patient Account Number: [id number] Date of Birth/Sex: Treating RN: [birth year] (62 y.o. 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/19/2023 No Yes foot 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- 12/06/2021 No Yes dominant side 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, stage 3 10/07/2022 No Yes Inactive Problems Resolved Problems Electronic Signature(s) Signed: 02/20/2023 7:34:23 AM By: Baltazar Najjar MD Entered By: Baltazar Najjar on 02/19/2023 11:18:57 Chad [name] ([id number]) [id number] Page 9 of  15 -------------------------------------------------------------------------------- Progress Note Details Patient Name: Date of Service: SAMYAR, KULLA 02/19/2023 9:15 A M Medical Record Number: [id number] Patient Account Number: [id number] Date of Birth/Sex: Treating RN: 12/28/1960 (62 y.o. [name] Primary Care Provider: [name] Other Clinician: Referring Provider: Treating Provider/Extender: Chauncey Mann, Chad EL Clovis Cao Weeks in Treatment: 64 Subjective History of Present Illness (HPI) 10/08/18 on evaluation today patient actually presents to our office for initial evaluation concerning wounds that he has of the bilateral lower extremities. 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 He also has bilateral venous stasis. He apparently has been homeless currently following discharge from the hospital apparently he has been placed at [facility] which is is a skilled nursing facility locally. Nonetheless fortunately he does not show any signs of infection at this time which is good news. In fact several of the wound actually appears to be showing some signs of improvement already in my pinion. There are a couple areas in the left leg in particular there likely gonna require some sharp debridement to help clear away some necrotic tissue and help with more sufficient healing. No fevers, chills, nausea, or vomiting noted at this time. 10/15/18 on evaluation today patient actually appears to be doing very well in regard to his bilateral lower extremities. He's been tolerating the dressing changes without complication. Fortunately there does not appear to be any evidence of active infection at this time which is great news. Overall I'm actually very pleased with how this has progressed in just one visits  time. Readmission: 08/14/2020 upon evaluation today patient presents for re-evaluation here in our clinic. He is having issues with his left ankle region as well as his right toe and his right heel. He tells me that the toe and heel actually began as a area that was itching that he was scratching and then subsequently opened up into wounds. These may have been abscess areas I presume based on what I am seeing currently. With regard to his left ankle region he tells me this was a similar type occurrence although he does have venous stasis this very well may be more of a venous leg ulcer more than anything. Nonetheless I do believe that the patient would benefit from appropriate and aggressive wound care to try to help get things under better control here. He does have history of a stroke on the left side affecting him to some degree there that he is able to stand although he does have some residual weakness. Otherwise again the patient does have chronic venous insufficiency as previously noted. His arterial studies most recently obtained showed that he had an ABI on the right of 1.16 with a TBI of 0.52 and on the left and ABI of 1.14 with a TBI of 0.81. That was obtained on 06/19/2020. 08/28/2020 upon evaluation today patient appears to be doing decently well in regard to his wounds in general. He has been tolerating the dressing changes without complication. Fortunately there does not appear to be any signs of active  infection which is great news. With that being said I think the [facility] is doing a good job I would recommend that we likely continue with that currently. 09/11/2020 upon evaluation today patient's wounds did not appear to be doing too poorly but again he is not really showing signs of significant improvement with regard to any of the wounds on the right. None of them have Hydrofera Blue on them I am not exactly sure why this is not being followed as the facility did not contact us to  let us know of any issues with obtaining dressings or otherwise. With that being said he is supposed to be using Hydrofera Blue on both of the wounds on the right foot as well as the ankle wound on the left side. 09/18/2020 upon evaluation today patient appears to be doing poorly with regard to his wounds. Again right now the left ankle in particular showed signs of extreme maceration. Apparently he was told by someone with staff at [facility] they could not get the [facility]. With that being said this is something that is never been relayed to [place] one way or another. Also the patient subsequently has not supposed to have a border gauze dressing on. He should have an ABD pad and roll gauze to secure as this drains much too much just to have a border gauze dressing to cover. Nonetheless the fact that they are not using the appropriate dressing is directly causing deterioration of the left ankle wound it is significantly worse today compared to what it was previous. I did attempt to call [facility] while the patient was here I called three times and got no one to even pick up the phone. After this I had my for an office coordinator call and she was able to finally get through and leave a message with the D ON as of dictation of this note which is roughly about an hour and a half later I still have not been able to speak with anyone at the facility. 09/25/2020 upon evaluation today patient actually showing signs of good improvement which is excellent news. He has been tolerating the dressing changes without complication. Fortunately there is no signs of active infection which is great news. No fevers, chills, nausea, vomiting, or diarrhea. I do feel like the facility has been doing a much better job at taking care of him as far as the dressings are concerned. However the director of nursing never did call me back. 10/09/2020 upon evaluation today patient appears to be doing well with  regard to his wound. The toe ulcer did require some debridement but the other 2 areas actually appear to be doing quite well. 10/19/2020 upon evaluation today patient actually appears to be doing very well in regard to his wounds. In fact the heel does appear to be completely healed. The toe is doing better in the medial ankle on the left is also doing better. Overall I think he is headed in the right direction. 10/26/2020 upon evaluation today patient appears to be doing well with regard to his wound. He is showing signs of improvement which is great news and overall I am very pleased with where things stand today. No fevers, chills, nausea, vomiting, or diarrhea. 11/02/2020 upon evaluation today patient appears to be doing well with regard to his wounds. He has been tolerating the dressing changes without complication overall I am extremely pleased with where things stand today. He in regard to the toe is  almost completely healed and the medial ankle on the left is doing much better. 11/09/2020 upon evaluation today patient appears to be doing a little poorly in regard to his left medial ankle ulcer. Fortunately there does not appear to be any signs of systemic infection but unfortunately locally he does appear to be infected in fact he has blue-green drainage consistent with Pseudomonas. 11/16/2020 upon evaluation today patient appears to be doing well with regard to his wound. It actually appears to be doing better. I did place him on gentamicin cream since the Cipro was actually resistant even though he was positive for Pseudomonas on culture. Overall I think that he does seem to be doing better KHALIF, INSIXIENGMAY ([id number]) [id number] Page 10 of 15 though I am unsure whether or not they have actually been putting the cream on. The patient is not sure that we did talk to the [name] directly and she was going to initiate that treatment. Fortunately there does not appear to be  any signs of active infection at this time. No fevers, chills, nausea, vomiting, or diarrhea. 4/28; the area on the right second toe is close to healed. Left medial ankle required debridement 12/07/2020 upon evaluation today patient appears to be doing well with regard to his wounds. In fact the right second toe appears to be completely healed which is great news. Fortunately there does not appear to be any signs of active infection at this time which is also great news. I think we can probably discontinue the gentamicin on top of everything else. 12/14/2020 upon evaluation today patient appears to be doing well with regard to his wound. He is making good progress and overall very pleased with where things stand today. There is no signs of active infection at this time which is great news. 12/28/2020 upon evaluation today patient appears to be doing well with regard to his wounds. He has been tolerating the dressing changes without complication. Fortunately there is no signs of active infection at this time. No fevers, chills, nausea, vomiting, or diarrhea. 12/28/2020 upon evaluation today patient's wound bed actually showed signs of excellent improvement. He has great epithelization and granulation I do not see any signs of infection overall I am extremely pleased with where things stand at this point. No fevers, chills, nausea, vomiting, or diarrhea. 01/11/2021 upon evaluation today patient appears to be doing well with regard to his wound on his leg. He has been tolerating the dressing changes without complication. Fortunately there does not appear to be any signs of active infection which is great news. No fevers, chills, nausea, vomiting, or diarrhea. 01/25/2021 upon evaluation today patient appears to be doing well with regard to his wound. He has been tolerating the dressing changes without complication. Fortunately the collagen seems to be doing a great job which is excellent news. No fevers, chills,  nausea, vomiting, or diarrhea. 02/08/2021 upon evaluation today patient's wound is actually looking a little bit worse especially in the periwound compared to previous. Fortunately there does not appear to be any signs of infection which is great news with that being said he does have some irritation around the periphery of the wound which has me more concerned. He actually had a dressing on that had not been changed in 3 days. He also is supposed to have daily dressing changes. With regard to the dressing applied he had a silver alginate dressing and silver collagen is what is recommended and ordered. He also had no Desitin around the edges  of the wound in the periwound region although that is on the order inspect to be done as well. In general I was very concerned I did contact [facility] actually spoke with Verlon Au who is the scheduling individual and subsequently she stated that she would pass the information to the D ON apparently the D ON was not available to talk to me when I call today. 02/18/2021 upon evaluation today patient's wound is actually showing signs of improvement. Fortunately there does not appear to be any evidence of infection which is great news overall I am extremely pleased with where things stand today. No fevers, chills, nausea, vomiting, or diarrhea. 8/3; patient presents for 1 week follow-up. He has no issues or complaints today. He denies signs of infection. 03/11/2021 upon evaluation today patient appears to be doing well with regard to his wound. He does have a little bit of slough noted on the surface of the wound but fortunately there does not appear to be any signs of active infection at this time. No fevers, chills, nausea, vomiting, or diarrhea. 03/18/2021 upon evaluation today patient appears to be doing well with regard to his wound. He has been tolerating the dressing changes without complication. There was a little irritation more proximal to where the wound  was that was not noted last week but nonetheless this is very superficial just seems to be more irritation we just need to make sure to put a good amount of the zinc over the area in my opinion. Otherwise he does not seem to be doing significantly worse at all which is great news. 03/25/2021 upon evaluation today patient appears to be doing well with regard to his wound. He is going require some sharp debridement today to clear with some of the necrotic debris. I did perform this today without complication postdebridement wound bed appears to be doing much better this is great news. 04/08/2021 upon evaluation today patient appears to be doing decently well in regard to his wound although the overall measurement is not significantly smaller compared to previous. It is gone down a little bit but still the facility continues to not really put the appropriate dressings in place in fact he was supposed to have collagen we think he probably had more of an allergy to At this point. Fortunately there does not appear to be any signs of active infection systemically though locally I do not see anything on initial visualization either as far as erythema or warmth. 04/15/2021 upon evaluation today patient appears to be doing well with regard to his wound. He is actually showing signs of improvement. I did place him on antibiotics last week, Cipro. He has been taking that 2 times a day and seems to be tolerating it very well. I do not see any evidence of worsening and in fact the overall appearance of the wound is smaller today which is also great news. 9/26; left medial ankle chronic venous insufficiency wound is improved. Using Hydrofera Blue 10/10; left medial ankle chronic venous insufficiency. Wound has not changed much in appearance completely nonviable surface. Apparently there have been problems getting the right product on the wound at the facility although he came in with [facility] on today 05/14/2021  upon evaluation today patient appears to be doing well with regard to his wound. I think he is making progress here which is good news. Fortunately there does not appear to be any signs of active infection at this time. No fevers, chills, nausea, vomiting, or  diarrhea. 05/20/2021 upon evaluation today patient appears to be doing well with regard to his wound. He is showing signs of good improvement which is great news. There does not appear to be any evidence of active infection which is also excellent news. No fevers, chills, nausea, vomiting, or diarrhea. 05/28/2021 upon evaluation today patient appears to be doing quite well. There does not appear to be any signs of active infection at this time which is great news. Overall I am extremely pleased with where things stand today. I think he is headed in the right direction. 06/11/2021 upon evaluation today patient appears to be doing well with regard to his left ankle ulcer and poorly in regard to the toe ulcer on the second toe right foot. This appears to show signs of joint exposure. Apparently this has been present for 1 to 2 months although he kept forgetting to tell me about it. That is unfortunate as right now it definitely appears to be doing significantly worse than what I would like to see. There does not appear to be any signs of active infection systemically though locally I am concerned about the possibility of infection the toe is quite red. Again no one from the facility ever contacted [place] to advise that this was going on in the interim either. 06/17/2021 upon evaluation today patient presents for follow-up I did review his x-ray which showed a navicular bone fracture I am unsure of the chronicity of this. Subsequently he also had osteomyelitis of the toe which was what I was more concerned about this did not show up on x-ray but did show up on the pathology scrapings. This was listed as acute osteomyelitis. Nonetheless at this point I think  that the antibiotic treatment is the best regimen to go with currently. The patient is in agreement with that plan. Nonetheless he has initially 30 days of doxycycline off likely extend that towards the end of the treatment cycle that will be around the middle of December for an additional 2 weeks. That all depends on how well he continues to heal. Nonetheless based on what I am seeing in the foot I did want a proceed with an MRI as well which I think will be helpful to identify if there is anything else that needs to be addressed from the standpoint of infection. 06/24/2021 upon evaluation today patient appears to be doing pretty well in regards to his wounds. I think both are actually showing signs of improvement which is good I did review his MRI today which did show signs of osteomyelitis of the middle and proximal phalanx on his right foot of the affected toe. With that being said this is actually showing signs of significant improvement today already with the antibiotic therapy I think the redness is also improved. Overall I think that we just need to give this some time with appropriate wound care we will see how things go potentially hyperbarics could be considered. 07/02/2021 upon inspection today patient actually appears to be doing well in regard to his left ankle which is getting very close to complete resolution of pleased in that regard. Unfortunately he is continuing to have issues with his second toe right foot and this seems to still be very painful for him. Recommend he try something different from the standpoint of antibiotics. TAL, MCCREEDY ([id number]) [id number] Page 11 of 15 07/15/2021 upon evaluation today patient appears to be doing actually pretty well in regard to his foot. This is actually showing signs of  significant improvement which is great news. Overall I feel like the patient is improving both in regard to the second toe as well as the  ankle on the left. With that being said the biggest issue that I do see currently is that he is needing to have a refill of the doxycycline that we previously treated him with. He also did see podiatry they are not going to recommend any amputation at this point since he seems to be doing quite well. For that reason we just need to keep things under control from an infection standpoint. 08/01/2021 upon evaluation today patient appears to be doing well with regard to his wound. He has been tolerating the dressing changes without complication. Fortunately there does not appear to be any evidence of active infection locally nor systemically at this point. In fact I think everything is doing excellent in fact his second toe on the right foot is almost healed and the ankle on the left ankle region is actually very close to being healed as well. 08/08/2021 upon evaluation today patient appears to be doing well with regard to his wound. He has been tolerating the dressing changes without complication. Fortunately I do not see any signs of active infection at this time. Readmission: 12-06-2021 upon evaluation today patient presents for reevaluation here in the clinic he does tell me that he was being seen in facility at [facility] by a provider that was coming in. He is not sure who this was. He tells me however that the wound seems to have gotten worse even compared to where it was when we last saw him at this point. With that being said I do believe that he is likely going need ongoing wound care here in the clinic and I do believe that we need to be the ones to frontline this since his wound does seem to be getting worse not better at this point. He voiced understanding. He is also in agreement with this plan and feels more comfortable coming here she tells me. Patient's medical history really has not changed since his prior admission he was only gone since January. 12-27-2021 upon evaluation today  patient appears to be doing well with regard to his wound they did run out of the [facility] so they did not put anything on just an ABD pad with gentamicin. Still we are seeing some signs of good improvement here with some new epithelization which is great news. 01-10-2022 upon evaluation today patient appears to be doing well with regard to his wounds and he is going require some sharp debridement but overall seems to be making good progress. Fortunately I do not see any evidence of active infection locally or systemically at this time which is great news. 01-24-2022 upon evaluation today patient appears to be doing well with regard to his wound. The facility actually came and dropped him off early and he had another appointment at the hospital and then they just brought him over here and this was still hours before his appointment this afternoon. For that reason we did do our best to work him in this morning and fortunately had some space to make this happen. With that being said patient's wound does seem to be making progress here and I am very pleased in that regard I do not see any signs of active infection locally or systemically at this time. 02-07-2022 patient appears to be doing well currently in regard to his wounds. In fact one of them  the more proximal is healed the distal is still open but seems to be doing excellent. Fortunately I do not see any evidence of active infection locally or systemically at this time which is great news. No fevers, chills, nausea, vomiting, or diarrhea. 7/27; left medial ankle venous. Improving per our intake [name]. We are using Hydrofera Blue under Tubigrip compression. They are changing that at his facility 03-06-2022 upon evaluation today patient appears to be doing well with regard to the his wound he is can require some sharp debridement but seems to be making excellent progress. Fortunately I do not see any evidence of active infection locally or  systemically which is great news. 03-27-2022 upon evaluation today patient's wound is actually showing signs of excellent improvement. Fortunately I see no signs of active infection locally or systemically at this time which is great news. No fevers, chills, nausea, vomiting, or diarrhea. 04-21-2022 upon evaluation today patient appears to be doing excellent in regard to his wound in fact this is very close to resolution based on what I am seeing. I do not see any evidence of active infection locally or systemically at this time which is great news and overall I am extremely pleased with where we are today. 05-05-2022 upon evaluation today patient's wound actually appears to potentially be completely healed. Fortunately I do not see any evidence of active infection at this time which is great news and overall I am very pleased I think this needs a little time to toughen up but other than that I really do believe were doing quite well. He is very pleased to hear this its been a long time coming. 05-19-2022 upon evaluation today patient appears to be doing well currently in regard to his wound. He in fact we were hoping will be completely healed and closed but it still has a very tiny area right in the center which is still continuing to drain. Fortunately I do not see any signs of infection locally or systemically which is great news. 11/6; this wound is close to completely" he comes in this week with some erythema at 1 edge of this which looks like something was rubbing on the area. Other than that no evidence of infection 06-16-2022 upon evaluation today patient appears to be doing well currently in regard to his ankle ulcer. This is very close to being healed but still has a small area in the central portion which is not. Fortunately there does not appear to be any signs of infection locally or systemically which is great news. No fevers, chills, nausea, vomiting, or diarrhea. 06-30-2022 upon  evaluation patient's wound pretty much appears to be almost completely closed. In fact it may even be closed but there are still a lot of skin irritation around the edges of the wound and I just do not feel great about releasing them yet I would like to continue to monitor this just a little bit longer before getting to that point. He is not opposed to this and in fact is happy to continue to come in if need be in order to make sure that things are moving in the right direction he definitely does not want to backtrack. 07-14-2022 upon evaluation today patient appears to be doing well currently in regard to his wound. Has been tolerating the dressing changes without complication. Fortunately I see no evidence of active infection locally nor systemically which is great news. No fevers, chills, nausea, vomiting, or diarrhea. 08-11-2022 upon evaluation today patient appears  to be doing a little worse than last time I saw him although it has been a month. Unfortunately he was not able to be seen due to the fact that he was quarantined in the facility with COVID. Subsequently he tells me that he mention to the facility staff several times about taking it off over the past several weeks but they continue to tell him that someone have to get in touch with [place] here although nobody ever did until Friday when the [name] manager called to have that documented in the communication notes. With that being said unfortunately this means that he had a wrap on that he was supposed to have on for only 1 week for ended up being on four 1 month essentially. I am glad that there is nothing worse than what we see currently to be perfectly honest. 08-19-2022 upon evaluation today patient appears to be doing well currently in regard to his wounds. He has been tolerating the dressing changes without complication. This is actually smaller today which is great news after last week's reopening. Fortunately I do not see any evidence of  infection locally nor systemically at this point. 08-26-2022 upon evaluation today patient appears to be doing well currently in regard to his wound. He has been tolerating the dressing changes without complication. Fortunately there does not appear to be any signs of active infection locally nor systemically which is great news. No fevers, chills, nausea, vomiting, or diarrhea. 09-02-2022 upon evaluation today patient's wound actually showing signs of excellent improvement this is measuring smaller and looking much better. I am very pleased with where we stand I do believe that we are headed in the right direction. ZEIK, LOEB ([id number]) [id number] Page 12 of 15 09-09-2022 upon evaluation today patient appears to be doing decently well in regard to his leg in general although there was some swelling this is the 1 thing that I am not too happy with based on what I see currently. Fortunately there does not appear to be any signs of infection locally nor systemically at this point. 09-16-2022 upon evaluation today patient appears to be doing well currently in regard to his wound. He is actually showing signs of improvement he wore the Tubigrip this week and it significantly better compared to last week's evaluation. Fortunately I do not see any evidence of active infection locally or systemically which is great news. Overall I think that were headed in the right direction which is great news. In fact overall I think this is the best that he has looked in some time. He does not like the Tubigrip but actually did extremely well with this in my opinion. I think he needs to continue to utilize this. 09-23-2022 upon evaluation today patient appears to be doing well currently in regard to his wound. He has been tolerating the dressing changes without complication. Fortunately there does not appear to be any signs of active infection locally nor systemically which is great news  and in general I do feel like that we are headed in the right direction. He has been having trouble with the Tubigrip however we have gotten the facility to order compression socks he has not gotten them as of yet. 3/4; patient is about the same with a wound on the left posterior leg. This is a venous wound. He needs more compression on this leg but he took off the previous wraps we are using. I am not certain whether he is using juxta lite stockings at  the nursing home. 3/12; patient presents for follow-up. He unfortunately has 2 new wounds 1 to the dorsal left side and the other to the right heel. He is not sure how these happened. The right heel appears to be caused by pressure. He has been using Hydrofera Blue to the original wound on the left leg. 10-14-2022 upon evaluation today patient appears to be doing well currently in regard to his original wound but unfortunately he has several new wounds which I was completely unaware of before walking into the room to see him today. Unfortunately he seems to not be doing nearly as well as what I saw when I last had an appointment with him 3 weeks ago. Fortunately I do not see any signs of systemic infection though unfortunately locally he has definite infection of the left lower extremity. He is currently on doxycycline I am going to likely have to extend that today. 10-21-2022 upon evaluation today patient appears to be doing well currently in regard to his wounds. He seems to be tolerating the dressing changes without complication. Fortunately there does not appear to be any signs of active infection locally or systemically which is great news and in general I definitely feel like we are on the right track here. 10-30-2022 upon evaluation today patient still continues to not be doing too well at all with regard to his wounds. Unfortunately he is still having signs of infection as well which also has been concerned. Fortunately I do not see any signs of  active infection locally nor systemically at this time. No fevers, chills, nausea, vomiting, or diarrhea. 11-06-2022 upon evaluation today patient appears to be doing okay currently in regard to his wounds. All things considered with his blood flow not being nearly as good as what should I think that he is making pretty good progress here. Fortunately I do not see any signs of infection locally nor systemically which is great news. No fevers, chills, nausea, vomiting, or diarrhea. 11-13-2022 upon evaluation today patient appears to be doing poorly currently in regard to his wounds. Things seem to be getting a little bit worse not better. 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 Subsequently he is also going to need to have vascular intervention but we are unsure exactly when this is going on the undertaking. I would try to actually send a message to Dr. Hazle Quant and the vascular team just to see what exactly the plan is going forward. The patient is in agreement with that plan I just think we need to have a plan especially in light of the fact the left leg seems to be getting a little bit worse at this point. 11-25-2022 upon evaluation today patient is currently now discharged from the hospital following his colostomy procedure. That did very well and he seems to be doing well. Fortunately there does not appear to be any signs of active infection locally or systemically which is great news. No fevers, chills, nausea, vomiting, or diarrhea. 12-04-2022 upon evaluation today patient appears to be doing actually decently well in regard to his wounds and actually very pleased with where we stand and I think that he is making some pretty good progress here. Fortunately I do not see any signs of active infection locally nor systemically which is great news. No fevers, chills, nausea, vomiting, or  diarrhea. 12-12-2022 upon evaluation today patient appears to  be doing well currently in regard to his wound. He has been tolerating the dressing changes without complication. The wounds all seem to be making some progress to a degree. He has appointment with vascular on the 23rd of this month. 5/24; the patient was scheduled for a left lower extremity angiogram yesterday but apparently they were overbooked and they wanted to admit him to hospital last night so they could do this early this morning but he refused I see that Dr. Wyn Quaker has rescheduled him for June 6. He is using silver alginate to wounds on his bilateral lower extremities. 01-02-2023 upon evaluation today patient appears to be doing poorly in regard to his wounds still. Subsequently he did have his angiogram but apparently there really was not much that was found that could be revascularized. The patient tells me that Dr. Wyn Quaker was going to contact me here in the office I have not heard from him as of yet and I may see about giving him a call just to follow-up or possibly send a message through epic which could be an option as well. 01-09-2023 upon evaluation today patient appears to be doing poorly in regard to his legs he is extremely swollen. He has been sleeping in his chair he tells me for the past week. His bed is broken and they have ordered a new one which is supposedly supposed to be there today. With that being said he the way he needs to be not sleeping in his chair that is his wheelchair every day and through the night. This is causing his legs to be much more swollen and it seen and how badly wounds are doing at this point. This has made things significantly worse. Fortunately I do not see any signs of infection right now unfortunately I do believe that the patient has a tremendous amount of drainage currently. 01-19-2023 upon evaluation today patient appears to be doing poorly in regard to his wounds of his legs his swelling is  better but still not as good as what we would like to see. Fortunately I do not see any evidence of active infection locally nor systemically which is great news and in general I do believe that we are moving in the right direction here. Nonetheless it would be better if she was in the bed sleeping or not send up in his wheelchair although he seems to still be set up in the wheelchair at this time. He tells me that he is just not as comfortable in the bed as the wheelchair but unfortunately this is causing damage to his legs which I discussed with him today as well. Nonetheless I am not sure that he is going to be amendable to doing anything different. 01-27-2023 upon evaluation today patient appears to be doing well currently. Fortunately I do not see any evidence of active infection locally nor systemically which is great news I think he is actually doing the best option and while in fact I would try to see if we can do some debridement today to get things moving along. 02-03-2023 upon evaluation today patient's wounds unfortunately are not doing nearly as well. Will be doing the [employer] wraps unfortunately we just not seeing the improvement that I would like to see. Fortunately I do not see any evidence of infection at this time which is good news unfortunately I do think that he needs to be changed on a daily basis due to the amount of drainage otherwise I do not  think this is really going to continue to improve dramatically. 02-12-2023 upon evaluation today patient appears to be doing better in a lot of ways in regard to his wounds and although his toes on the left side actually seem a bit worse. Fortunately I do not see any evidence of active infection locally nor systemically at this time which is good news. With that being said he is having some bleeding from the left leg I think that this is an area that looks like it probably got bumped. With that being said I discussed this with the patient he  tells me he does not know of any trauma that occurred but at the same time if was not this that it was skin that got pulled off. Dressing change there is only one of the 2 that could have happened. 7/25; this is a patient who is very frail at [facility]. He is nonambulatory. He has wounds on his bilateral lower legs. However much worse on the left we have been using silver cell to all of his wounds KSEAN, MCGLAUN ([id number]) [id number] Page 13 of 15 He comes in today and a lot more pain our intake Chad [name] reports deterioration of the left foot. I ended up looking through some of his record. 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 I am really not certain of the stage of this currently or who is following Objective Constitutional Patient is hypertensive.. Pulse regular and within target range for patient.Marland Kitchen Respirations regular, non-labored and within target range.. Temperature is normal and within the target range for the patient.. The patient appears uncomfortable legs contracted left greater than right.. Vitals Time Taken: 9:53 AM, Height: 69 in, Weight: 150 lbs, BMI: 22.1, Temperature: 97.6 F, Pulse: 101 bpm, Respiratory Rate: 18 breaths/min, Blood Pressure: 151/100 mmHg. Cardiovascular I could not feel a left popliteal pulse. Pedal pulses are not palpable in the left foot or the right foot. General Notes: Wound exam  The patient has multiple wounds but the  worst of these is on the left forefoot where is there is skin breakdown just proximal to his toes multiple wounds in between his toes the area is malodorous, wet. Very difficult to examine because of pain  He also has areas on the right second toe left medial lower leg Integumentary (Hair, Skin) Wound #12 status is Open. Original cause of wound was Gradually Appeared. The date acquired was: 07/12/2019. The wound has been in treatment 62 weeks. The wound is located on the Left,Distal,Medial Ankle. The wound measures 2.5cm length x 1.2cm width x 0.2cm depth; 2.356cm^2 area and 0.471cm^3 volume. There is Fat Layer (Subcutaneous Tissue) exposed. There is a medium amount of serosanguineous drainage noted. There is large (67-100%) pink granulation within the wound bed. There is a small (1-33%) amount of necrotic tissue within the wound bed including Adherent Slough. Wound #15 status is Open. Original cause of wound was Gradually Appeared. The date acquired was: 10/01/2022. The wound has been in treatment 19 weeks. The wound is located on the Left,Dorsal Foot. The wound  measures 9.5cm length x 6cm width x 0.2cm depth; 44.768cm^2 area and 8.954cm^3 volume. There is Fat Layer (Subcutaneous Tissue) exposed. There is a medium amount of serosanguineous drainage noted. There is small (1-33%) pink granulation within the wound bed. There is a large (67-100%) amount of necrotic tissue within the wound bed including Eschar and Adherent Slough. Wound #16 status is Open. Original cause of wound was Gradually Appeared. The date acquired was: 09/26/2022. The wound has been in treatment 19 weeks. The wound is located on the Right Calcaneus. The wound measures 4cm length x 3cm width x 0.1cm depth; 9.425cm^2 area and 0.942cm^3 volume. There is Fat Layer (Subcutaneous Tissue) exposed. There is a medium amount of serosanguineous drainage noted. There is small (1-33%) red, pink granulation within the wound bed. There is a large  (67-100%) amount of necrotic tissue within the wound bed including Eschar and Adherent Slough. Wound #17 status is Open. Original cause of wound was Pressure Injury. The date acquired was: 09/30/2022. The wound has been in treatment 18 weeks. The wound is located on the Left,Anterior Lower Leg. The wound measures 4.5cm length x 2cm width x 0.1cm depth; 7.069cm^2 area and 0.707cm^3 volume. There is no tunneling or undermining noted. There is a medium amount of serosanguineous drainage noted. There is large (67-100%) red, pink granulation within the wound bed. There is a small (1-33%) amount of necrotic tissue within the wound bed including Adherent Slough. Wound #18 status is Open. Original cause of wound was Pressure Injury. The date acquired was: 09/26/2022. The wound has been in treatment 18 weeks. The wound is located on the Right,Medial T Second. The wound measures 1.5cm length x 1cm width x 0.1cm depth; 1.178cm^2 area and 0.118cm^3 volume. There oe is Fat Layer (Subcutaneous Tissue) exposed. There is a medium amount of serosanguineous drainage noted. There is small (1-33%) red, pink granulation within the wound bed. There is a medium (34-66%) amount of necrotic tissue within the wound bed including Adherent Slough. Wound #19 status is Open. Original cause of wound was Gradually Appeared. The date acquired was: 11/10/2022. The wound has been in treatment 14 weeks. The wound is located on the Left,Posterior Lower Leg. The wound measures 2.5cm length x 2cm width x 0.2cm depth; 3.927cm^2 area and 0.785cm^3 volume. There is Fat Layer (Subcutaneous Tissue) exposed. There is a medium amount of serous drainage noted. There is small (1-33%) red, pink granulation within the wound bed. There is a large (67-100%) amount of necrotic tissue within the wound bed including Adherent Slough. 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 Chad [name] Visit as needed Bathing/ Shower/ Hygiene: May shower with wound dressing protected with water repellent cover or cast protector. No tub bath. Anesthetic (Use 'Patient Medications' Section for Anesthetic Order Entry): Lidocaine applied to wound bed Edema Control - Lymphedema / Segmental Compressive Device / Other: Elevate legs to the level of the heart and pump ankles as often as possible Elevate leg(s) parallel to the floor when sitting. Other: - PATIENT MUST SLEEP IN A BED. Legs are swelling  due to no elevation of legs. Off-Loading: Heel suspension boot - when in bed Medications-Please add to medication list.: Other: - apply AandD ointment to leg, do not apply to wound WOUND #12: - Ankle Wound Laterality: Left, Medial, Distal Cleanser: Soap and Water 1 x Per Day/30 Days Discharge Instructions: Gently cleanse wound with antibacterial soap, rinse and pat dry prior to dressing wounds Cleanser: Wound Cleanser 1 x Per Day/30 Days Discharge Instructions: Wash your hands with soap and water. Remove old dressing, discard into plastic bag and place into trash. Cleanse the wound with Wound Cleanser prior to applying a clean dressing using gauze sponges, not tissues or cotton balls. Do not scrub or use excessive force. Pat dry using gauze sponges, not tissue or cotton balls. Prim Dressing: Silvercel Small 2x2 (in/in) 1 x Per Day/30 Days ary Discharge Instructions: Apply Silvercel Small 2x2  (in/in) as instructed Secondary Dressing: ABD Pad 5x9 (in/in) 1 x Per Day/30 Days Discharge Instructions: Cover with ABD pad Secured With: ACE WRAP - 68M ACE Elastic Bandage With VELCRO Brand Closure, 4 (in) 1 x Per Day/30 Days WOUND #15: - Foot Wound Laterality: Dorsal, Left Cleanser: Soap and Water 1 x Per Day/30 Days Discharge Instructions: Gently cleanse wound with antibacterial soap, rinse and pat dry prior to dressing wounds Cleanser: Wound Cleanser 1 x Per Day/30 Days Discharge Instructions: Wash your hands with soap and water. Remove old dressing, discard into plastic bag and place into trash. Cleanse the wound with Wound Cleanser prior to applying a clean dressing using gauze sponges, not tissues or cotton balls. Do not scrub or use excessive force. Pat dry using gauze sponges, not tissue or cotton balls. Prim Dressing: Silvercel Small 2x2 (in/in) 1 x Per Day/30 Days ary Discharge Instructions: Place in between the toes and on the foot Secondary Dressing: ABD Pad 5x9 (in/in) 1 x Per Day/30 Days Discharge Instructions: Cover with ABD pad Secured With: ACE WRAP - 68M ACE Elastic Bandage With VELCRO Brand Closure, 4 (in) 1 x Per Day/30 Days WOUND #16: - Calcaneus Wound Laterality: Right Cleanser: Soap and Water 1 x Per Day/30 Days Discharge Instructions: Gently cleanse wound with antibacterial soap, rinse and pat dry prior to dressing wounds Cleanser: Wound Cleanser 1 x Per Day/30 Days Discharge Instructions: Wash your hands with soap and water. Remove old dressing, discard into plastic bag and place into trash. Cleanse the wound with Wound Cleanser prior to applying a clean dressing using gauze sponges, not tissues or cotton balls. Do not scrub or use excessive force. Pat dry using gauze sponges, not tissue or cotton balls. Prim Dressing: Silvercel Small 2x2 (in/in) 1 x Per Day/30 Days ary Discharge Instructions: Apply Silvercel Small 2x2 (in/in) as instructed Secondary Dressing: ABD  Pad 5x9 (in/in) 1 x Per Day/30 Days Discharge Instructions: Cover with ABD pad Secured With: ACE WRAP - 68M ACE Elastic Bandage With VELCRO Brand Closure, 4 (in) 1 x Per Day/30 Days Secured With: Heel cup 1 x Per Day/30 Days Discharge Instructions: May reuse over ABD WOUND #17: - Lower Leg Wound Laterality: Left, Anterior Cleanser: Soap and Water 1 x Per Day/30 Days Discharge Instructions: Gently cleanse wound with antibacterial soap, rinse and pat dry prior to dressing wounds Cleanser: Wound Cleanser 1 x Per Day/30 Days Discharge Instructions: Wash your hands with soap and water. Remove old dressing, discard into plastic bag and place into trash. Cleanse the wound with Wound Cleanser prior to applying a clean dressing using gauze sponges, not tissues or cotton balls.  Do not scrub or use excessive force. Pat dry using gauze sponges, not tissue or cotton balls. Prim Dressing: Silvercel Small 2x2 (in/in) 1 x Per Day/30 Days ary Discharge Instructions: Apply Silvercel Small 2x2 (in/in) as instructed Secondary Dressing: ABD Pad 5x9 (in/in) 1 x Per Day/30 Days Discharge Instructions: Cover with ABD pad Secured With: ACE WRAP - 89M ACE Elastic Bandage With VELCRO Brand Closure, 4 (in) 1 x Per Day/30 Days WOUND #18: - T Second Wound Laterality: Right, Medial oe Cleanser: Soap and Water 1 x Per Day/30 Days Discharge Instructions: Gently cleanse wound with antibacterial soap, rinse and pat dry prior to dressing wounds Cleanser: Wound Cleanser 1 x Per Day/30 Days Discharge Instructions: Wash your hands with soap and water. Remove old dressing, discard into plastic bag and place into trash. Cleanse the wound with Wound Cleanser prior to applying a clean dressing using gauze sponges, not tissues or cotton balls. Do not scrub or use excessive force. Pat dry using gauze sponges, not tissue or cotton balls. Prim Dressing: Silvercel Small 2x2 (in/in) 1 x Per Day/30 Days ary Discharge Instructions: Apply  Silvercel Small 2x2 (in/in) as instructed Secondary Dressing: Coverlet Latex-Free Fabric Adhesive Dressings 1 x Per Day/30 Days Discharge Instructions: Knuckle WOUND #19: - Lower Leg Wound Laterality: Left, Posterior Cleanser: Soap and Water 1 x Per Day/30 Days Discharge Instructions: Gently cleanse wound with antibacterial soap, rinse and pat dry prior to dressing wounds Cleanser: Wound Cleanser 1 x Per Day/30 Days Discharge Instructions: Wash your hands with soap and water. Remove old dressing, discard into plastic bag and place into trash. Cleanse the wound with Wound Cleanser prior to applying a clean dressing using gauze sponges, not tissues or cotton balls. Do not scrub or use excessive force. Pat dry using gauze sponges, not tissue or cotton balls. Prim Dressing: Silvercel Small 2x2 (in/in) 1 x Per Day/30 Days ary Discharge Instructions: Apply Silvercel Small 2x2 (in/in) as instructed Secondary Dressing: ABD Pad 5x9 (in/in) 1 x Per Day/30 Days Discharge Instructions: Cover with ABD pad Secured With: ACE WRAP - 89M ACE Elastic Bandage With VELCRO Brand Closure, 4 (in) 1 x Per Day/30 Days TAIJON, KLOSTER ([id number]) [id number] Page 15 of 15 1. I think this patient is having ischemic left forefoot breakdown involving his toes 1-4 in the left dorsal foot. I think his pain is largely related to this he has wounds on the left medial lower leg and the left dorsal foot although none of these look particularly ominous 2. Also a wound on the right second toe although this is not nearly as bad as on the left 3. The patient has already had an angiogram he is not a revascularization candidate. He really looks uncomfortable and I wonder whether he would benefit from a left below-knee amputation 4. The patient has had a history of 2 carcinomas. This includes urothelial carcinoma the bladder and sigmoid colon cancer. The status of these is not exactly clear to me but I  certainly think that that would influence whether he is a candidate for general anesthesia 5. Because of the deterioration I put him on Levaquin. He is not on Coumadin. Allergic to penicillin. There was nothing that really looked like culture would be of benefit 6. I also wonder whether this patient would be a candidate for admission to hospice. I know that he is seen palliative care. A lot of this might depend on the status of his cancers which I was not able to see on a quick review  of epic Electronic Signature(s) Signed: 02/20/2023 7:34:23 AM By: Baltazar Najjar MD Entered By: Baltazar Najjar on 02/19/2023 11:31:04 -------------------------------------------------------------------------------- SuperBill Details Patient Name: Date of Service: SA, PISARSKI 02/19/2023 Medical Record Number: [id number] Patient Account Number: [id number] Date of Birth/Sex: Treating RN: May 10, 1961 (62 y.o. [name] Primary Care Provider: [name] Other Clinician: Referring Provider: Treating Provider/Extender: Chauncey Mann, Chad EL Clovis Cao Weeks in Treatment: 60 Diagnosis Coding ICD-10 Codes Code Description [id number] Chronic venous hypertension (idiopathic) with ulcer and inflammation of left lower extremity 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 foot 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 side 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, stage 3 Facility Procedures : CPT4 Code: [id number] Description: 46270 - WOUND CARE VISIT-LEV 5 EST PT Modifier: Quantity: 1 Physician Procedures : CPT4 Code Description Modifier [id number] - WC PHYS LEVEL 4 - EST PT ICD-10 Diagnosis Description I70.245 Atherosclerosis of native arteries of left leg with  ulceration of other part of foot L97.322 Non-pressure chronic ulcer of left ankle with fat  layer exposed L98.492 Non-pressure chronic ulcer of skin of other sites with fat layer exposed Quantity: 1 Electronic Signature(s) Signed: 02/20/2023 7:34:23 AM By: Baltazar Najjar MD Entered By: Baltazar Najjar on 02/19/2023 11:48:28

## 2023-02-26 ENCOUNTER — Ambulatory Visit: Payer: Medicaid Other | Admitting: Physician Assistant

## 2023-02-26 ENCOUNTER — Inpatient Hospital Stay
Admission: EM | Admit: 2023-02-26 | Discharge: 2023-03-06 | DRG: 239 | Disposition: A | Discharge status: Skilled Nursing Facility | Payer: Medicaid Other | Source: Ambulatory Visit | Attending: Internal Medicine | Admitting: Internal Medicine

## 2023-02-26 ENCOUNTER — Inpatient Hospital Stay: Payer: Medicaid Other

## 2023-02-26 ENCOUNTER — Other Ambulatory Visit: Payer: Self-pay

## 2023-02-26 ENCOUNTER — Encounter: Payer: Medicaid Other | Attending: Physician Assistant | Admitting: Physician Assistant

## 2023-02-26 DIAGNOSIS — F329 Major depressive disorder, single episode, unspecified: Secondary | ICD-10-CM | POA: Diagnosis present

## 2023-02-26 DIAGNOSIS — L97522 Non-pressure chronic ulcer of other part of left foot with fat layer exposed: Secondary | ICD-10-CM | POA: Diagnosis present

## 2023-02-26 DIAGNOSIS — E43 Unspecified severe protein-calorie malnutrition: Secondary | ICD-10-CM | POA: Diagnosis present

## 2023-02-26 DIAGNOSIS — N401 Enlarged prostate with lower urinary tract symptoms: Secondary | ICD-10-CM | POA: Diagnosis present

## 2023-02-26 DIAGNOSIS — R64 Cachexia: Secondary | ICD-10-CM | POA: Diagnosis present

## 2023-02-26 DIAGNOSIS — C689 Malignant neoplasm of urinary organ, unspecified: Secondary | ICD-10-CM | POA: Diagnosis present

## 2023-02-26 DIAGNOSIS — B1920 Unspecified viral hepatitis C without hepatic coma: Secondary | ICD-10-CM | POA: Diagnosis not present

## 2023-02-26 DIAGNOSIS — L89613 Pressure ulcer of right heel, stage 3: Secondary | ICD-10-CM | POA: Insufficient documentation

## 2023-02-26 DIAGNOSIS — E876 Hypokalemia: Secondary | ICD-10-CM | POA: Diagnosis not present

## 2023-02-26 DIAGNOSIS — L97512 Non-pressure chronic ulcer of other part of right foot with fat layer exposed: Secondary | ICD-10-CM | POA: Diagnosis not present

## 2023-02-26 DIAGNOSIS — L97529 Non-pressure chronic ulcer of other part of left foot with unspecified severity: Secondary | ICD-10-CM | POA: Diagnosis present

## 2023-02-26 DIAGNOSIS — Z85038 Personal history of other malignant neoplasm of large intestine: Secondary | ICD-10-CM | POA: Diagnosis not present

## 2023-02-26 DIAGNOSIS — I96 Gangrene, not elsewhere classified: Secondary | ICD-10-CM

## 2023-02-26 DIAGNOSIS — L03116 Cellulitis of left lower limb: Secondary | ICD-10-CM | POA: Diagnosis present

## 2023-02-26 DIAGNOSIS — I87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 hypertension (idiopathic) with ulcer and inflammation of left lower extremity: Secondary | ICD-10-CM | POA: Insufficient documentation

## 2023-02-26 DIAGNOSIS — I998 Other disorder of circulatory system: Secondary | ICD-10-CM

## 2023-02-26 DIAGNOSIS — E785 Hyperlipidemia, unspecified: Secondary | ICD-10-CM | POA: Diagnosis present

## 2023-02-26 DIAGNOSIS — L97212 Non-pressure chronic ulcer of right calf with fat layer exposed: Secondary | ICD-10-CM | POA: Diagnosis present

## 2023-02-26 DIAGNOSIS — F17210 Nicotine dependence, cigarettes, uncomplicated: Secondary | ICD-10-CM | POA: Diagnosis present

## 2023-02-26 DIAGNOSIS — I2510 Atherosclerotic heart disease of native coronary artery without angina pectoris: Secondary | ICD-10-CM | POA: Insufficient documentation

## 2023-02-26 DIAGNOSIS — E872 Acidosis, unspecified: Secondary | ICD-10-CM | POA: Diagnosis not present

## 2023-02-26 DIAGNOSIS — L97322 Non-pressure chronic ulcer of left ankle with fat layer exposed: Secondary | ICD-10-CM | POA: Diagnosis not present

## 2023-02-26 DIAGNOSIS — Z8546 Personal history of malignant neoplasm of prostate: Secondary | ICD-10-CM

## 2023-02-26 DIAGNOSIS — I70245 Atherosclerosis of native arteries of left leg with ulceration of other part of foot: Secondary | ICD-10-CM | POA: Diagnosis not present

## 2023-02-26 DIAGNOSIS — C680 Malignant neoplasm of urethra: Secondary | ICD-10-CM | POA: Diagnosis present

## 2023-02-26 DIAGNOSIS — Z8619 Personal history of other infectious and parasitic diseases: Secondary | ICD-10-CM

## 2023-02-26 DIAGNOSIS — I69354 Hemiplegia and hemiparesis following cerebral infarction affecting left non-dominant side: Secondary | ICD-10-CM | POA: Diagnosis not present

## 2023-02-26 DIAGNOSIS — D649 Anemia, unspecified: Secondary | ICD-10-CM | POA: Diagnosis present

## 2023-02-26 DIAGNOSIS — Z933 Colostomy status: Secondary | ICD-10-CM

## 2023-02-26 DIAGNOSIS — S81802A Unspecified open wound, left lower leg, initial encounter: Secondary | ICD-10-CM | POA: Diagnosis not present

## 2023-02-26 DIAGNOSIS — R54 Age-related physical debility: Secondary | ICD-10-CM | POA: Diagnosis present

## 2023-02-26 DIAGNOSIS — G893 Neoplasm related pain (acute) (chronic): Secondary | ICD-10-CM | POA: Diagnosis present

## 2023-02-26 DIAGNOSIS — I70232 Atherosclerosis of native arteries of right leg with ulceration of calf: Secondary | ICD-10-CM | POA: Diagnosis present

## 2023-02-26 DIAGNOSIS — Z720 Tobacco use: Secondary | ICD-10-CM | POA: Diagnosis not present

## 2023-02-26 DIAGNOSIS — Z8042 Family history of malignant neoplasm of prostate: Secondary | ICD-10-CM

## 2023-02-26 DIAGNOSIS — L98492 Non-pressure chronic ulcer of skin of other sites with fat layer exposed: Secondary | ICD-10-CM | POA: Diagnosis not present

## 2023-02-26 DIAGNOSIS — Z800 Family history of malignant neoplasm of digestive organs: Secondary | ICD-10-CM

## 2023-02-26 DIAGNOSIS — I878 Other specified disorders of veins: Secondary | ICD-10-CM | POA: Diagnosis not present

## 2023-02-26 DIAGNOSIS — I70262 Atherosclerosis of native arteries of extremities with gangrene, left leg: Principal | ICD-10-CM | POA: Diagnosis present

## 2023-02-26 DIAGNOSIS — Z1509 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: Secondary | ICD-10-CM

## 2023-02-26 DIAGNOSIS — J449 Chronic obstructive pulmonary disease, unspecified: Secondary | ICD-10-CM | POA: Diagnosis present

## 2023-02-26 DIAGNOSIS — Z66 Do not resuscitate: Secondary | ICD-10-CM | POA: Diagnosis present

## 2023-02-26 DIAGNOSIS — N138 Other obstructive and reflux uropathy: Secondary | ICD-10-CM | POA: Diagnosis present

## 2023-02-26 DIAGNOSIS — K219 Gastro-esophageal reflux disease without esophagitis: Secondary | ICD-10-CM | POA: Diagnosis present

## 2023-02-26 DIAGNOSIS — Z5901 Sheltered homelessness: Secondary | ICD-10-CM | POA: Diagnosis not present

## 2023-02-26 DIAGNOSIS — Z8051 Family history of malignant neoplasm of kidney: Secondary | ICD-10-CM

## 2023-02-26 DIAGNOSIS — Z9049 Acquired absence of other specified parts of digestive tract: Secondary | ICD-10-CM | POA: Diagnosis not present

## 2023-02-26 DIAGNOSIS — Z7982 Long term (current) use of aspirin: Secondary | ICD-10-CM

## 2023-02-26 DIAGNOSIS — I69334 Monoplegia of upper limb following cerebral infarction affecting left non-dominant side: Secondary | ICD-10-CM

## 2023-02-26 DIAGNOSIS — L97909 Non-pressure chronic ulcer of unspecified part of unspecified lower leg with unspecified severity: Secondary | ICD-10-CM | POA: Diagnosis present

## 2023-02-26 DIAGNOSIS — Z6821 Body mass index (BMI) 21.0-21.9, adult: Secondary | ICD-10-CM

## 2023-02-26 DIAGNOSIS — Z79899 Other long term (current) drug therapy: Secondary | ICD-10-CM

## 2023-02-26 DIAGNOSIS — Z8551 Personal history of malignant neoplasm of bladder: Secondary | ICD-10-CM

## 2023-02-26 DIAGNOSIS — Z806 Family history of leukemia: Secondary | ICD-10-CM

## 2023-02-26 DIAGNOSIS — Z880 Allergy status to penicillin: Secondary | ICD-10-CM

## 2023-02-26 DIAGNOSIS — F411 Generalized anxiety disorder: Secondary | ICD-10-CM | POA: Diagnosis present

## 2023-02-26 DIAGNOSIS — Z7902 Long term (current) use of antithrombotics/antiplatelets: Secondary | ICD-10-CM

## 2023-02-26 LAB — COMPREHENSIVE METABOLIC PANEL
ALT: 16 U/L (ref 0–44)
AST: 32 U/L (ref 15–41)
Albumin: 3.1 g/dL — ABNORMAL LOW (ref 3.5–5.0)
Alkaline Phosphatase: 88 U/L (ref 38–126)
Anion gap: 11 (ref 5–15)
BUN: 38 mg/dL — ABNORMAL HIGH (ref 8–23)
CO2: 19 mmol/L — ABNORMAL LOW (ref 22–32)
Calcium: 8.8 mg/dL — ABNORMAL LOW (ref 8.9–10.3)
Chloride: 106 mmol/L (ref 98–111)
Creatinine, Ser: 1.17 mg/dL (ref 0.61–1.24)
GFR, Estimated: >60 mL/min (ref >60)
Glucose, Bld: 101 mg/dL — ABNORMAL HIGH (ref 70–99)
Potassium: 4 mmol/L (ref 3.5–5.1)
Sodium: 136 mmol/L (ref 135–145)
Total Bilirubin: 1 mg/dL (ref 0.3–1.2)
Total Protein: 8.1 g/dL (ref 6.5–8.1)

## 2023-02-26 LAB — C-REACTIVE PROTEIN: CRP: 12.7 mg/dL — ABNORMAL HIGH (ref <1.0)

## 2023-02-26 LAB — CBC
HCT: 38.6 % — ABNORMAL LOW (ref 39.0–52.0)
Hemoglobin: 12.8 g/dL — ABNORMAL LOW (ref 13.0–17.0)
MCH: 27.5 pg (ref 26.0–34.0)
MCHC: 33.2 g/dL (ref 30.0–36.0)
MCV: 83 fL (ref 80.0–100.0)
Platelets: 196 10*3/uL (ref 150–400)
RBC: 4.65 MIL/uL (ref 4.22–5.81)
RDW: 15 % (ref 11.5–15.5)
WBC: 8 10*3/uL (ref 4.0–10.5)
nRBC: 0 % (ref 0.0–0.2)

## 2023-02-26 LAB — PROTIME-INR
INR: 1.2 (ref 0.8–1.2)
Prothrombin Time: 15.6 seconds — ABNORMAL HIGH (ref 11.4–15.2)

## 2023-02-26 LAB — APTT: aPTT: 39 seconds — ABNORMAL HIGH (ref 24–36)

## 2023-02-26 LAB — LACTIC ACID, PLASMA
Lactic Acid, Venous: 1.5 mmol/L (ref 0.5–1.9)
Lactic Acid, Venous: 1.7 mmol/L (ref 0.5–1.9)

## 2023-02-26 LAB — HIV ANTIBODY (ROUTINE TESTING W REFLEX): HIV Screen 4th Generation wRfx: NONREACTIVE

## 2023-02-26 LAB — SEDIMENTATION RATE: Sed Rate: 60 mm/hr — ABNORMAL HIGH (ref 0–20)

## 2023-02-26 LAB — PREALBUMIN: Prealbumin: 12 mg/dL — ABNORMAL LOW (ref 18–38)

## 2023-02-26 MED ORDER — GADOBUTROL 1 MMOL/ML IV SOLN
6.0000 mL | Freq: Once | INTRAVENOUS | Status: AC | PRN
  Administered 2023-02-27: 6 mL via INTRAVENOUS

## 2023-02-26 MED ORDER — HEPARIN (PORCINE) 25000 UT/250ML-% IV SOLN
1100.0000 [IU]/h | INTRAVENOUS | Status: DC
  Administered 2023-02-26: 950 [IU]/h via INTRAVENOUS
  Administered 2023-02-27 – 2023-03-03 (×5): 1100 [IU]/h via INTRAVENOUS
  Filled 2023-02-26 (×6): qty 250

## 2023-02-26 MED ORDER — ONDANSETRON HCL 4 MG PO TABS: 4.0000 mg | ORAL_TABLET | Freq: Four times a day (QID) | ORAL | Status: DC | PRN

## 2023-02-26 MED ORDER — ATORVASTATIN CALCIUM 10 MG PO TABS
10.0000 mg | ORAL_TABLET | Freq: Every day | ORAL | Status: DC
  Administered 2023-02-26 – 2023-03-06 (×8): 10 mg via ORAL
  Filled 2023-02-26 (×9): qty 1

## 2023-02-26 MED ORDER — SODIUM CHLORIDE 0.9 % IV SOLN: INTRAVENOUS | Status: DC

## 2023-02-26 MED ORDER — HEPARIN BOLUS VIA INFUSION
3900.0000 [IU] | Freq: Once | INTRAVENOUS | Status: AC
  Administered 2023-02-26: 3900 [IU] via INTRAVENOUS
  Filled 2023-02-26: qty 3900

## 2023-02-26 MED ORDER — VANCOMYCIN HCL IN DEXTROSE 1-5 GM/200ML-% IV SOLN
1000.0000 mg | Freq: Once | INTRAVENOUS | Status: AC
  Administered 2023-02-26: 1000 mg via INTRAVENOUS
  Filled 2023-02-26: qty 200

## 2023-02-26 MED ORDER — ENOXAPARIN SODIUM 40 MG/0.4ML IJ SOSY: 40.0000 mg | PREFILLED_SYRINGE | INTRAMUSCULAR | Status: DC

## 2023-02-26 MED ORDER — OXYCODONE HCL 5 MG PO TABS
10.0000 mg | ORAL_TABLET | Freq: Four times a day (QID) | ORAL | Status: DC
  Administered 2023-02-26 – 2023-02-27 (×4): 10 mg via ORAL
  Filled 2023-02-26 (×4): qty 2

## 2023-02-26 MED ORDER — ONDANSETRON HCL 4 MG/2ML IJ SOLN: 4.0000 mg | Freq: Four times a day (QID) | INTRAMUSCULAR | Status: DC | PRN

## 2023-02-26 MED ORDER — VANCOMYCIN HCL 500 MG/100ML IV SOLN
500.0000 mg | Freq: Once | INTRAVENOUS | Status: AC
  Administered 2023-02-26: 500 mg via INTRAVENOUS
  Filled 2023-02-26: qty 100

## 2023-02-26 MED ORDER — PIPERACILLIN-TAZOBACTAM 3.375 G IVPB 30 MIN
3.3750 g | Freq: Once | INTRAVENOUS | Status: AC
  Administered 2023-02-26: 3.375 g via INTRAVENOUS
  Filled 2023-02-26: qty 50

## 2023-02-26 MED ORDER — PIPERACILLIN-TAZOBACTAM 3.375 G IVPB
3.3750 g | Freq: Three times a day (TID) | INTRAVENOUS | Status: DC
  Administered 2023-02-27 – 2023-03-04 (×16): 3.375 g via INTRAVENOUS
  Filled 2023-02-26 (×15): qty 50

## 2023-02-26 MED ORDER — VANCOMYCIN HCL 1250 MG/250ML IV SOLN
1250.0000 mg | INTRAVENOUS | Status: DC
  Administered 2023-02-27: 1250 mg via INTRAVENOUS
  Filled 2023-02-26: qty 250

## 2023-02-26 NOTE — ED Triage Notes (Signed)
Arrives [facility].  Seen through Clinic to check on wounds.  Left foot wound is necrotic and more cyanotic today, sent to ED for eval.  Resident at [facility].  VS wnl.  No complaints of pain. Hx Hep C, and current urinary tract cancer, CVA- left sided deficit and non ambulatory at baseline.  No fever.

## 2023-02-26 NOTE — Consult Note (Signed)
Pharmacy Antibiotic Note  ASSESSMENT: 62 y.o. male with PMH PAD, chronic foot ulceration is presenting with  wound infection and concern for osteomyelitis . Pharmacy has been consulted to manage Zosyn and vancomycin dosing.  Patient measurements: Height: 5\' 7"  (170.2 cm) Weight: 64.9 kg (143 lb) IBW/kg (Calculated) : 66.1  Vital signs: Temp: 97.9 F (36.6 C) (08/01 1211) Temp Source: Oral (08/01 1211) BP: 127/71 (08/01 1730) Pulse Rate: 100 (08/01 1730) Recent Labs  Lab 02/26/23 1537  WBC 8.0  CREATININE 1.17   Estimated Creatinine Clearance: 58.6 mL/min (by C-G formula based on SCr of 1.17 mg/dL).  Allergies: Allergies  Allergen Reactions   Penicillins Rash    Antimicrobials this admission: Zosyn 8/1 >>  Vancomycin 8/1 >>  Dose adjustments this admission: N/A  Microbiology results: 8/1 BCx: collected  Vancomycin 1250 mg IV q24H Goal AUC 400-550  Est AUC: 505; Cmax: 37; Cmin: 11 SCr 1.17; TBW; Vd 0,72   PLAN: Load patient with vancomycin 1500 mg x 1 and initiate vancomycin 1250 mg IV q24H thereafter Initiate Zosyn 3.375 IV q8H Follow up culture results to assess for antibiotic optimization. Monitor renal function to assess for any necessary antibiotic dosing changes.   Thank you for allowing pharmacy to be a part of this patient's care.  Will M. Dareen Piano, PharmD Clinical Pharmacist 02/26/2023 6:19 PM

## 2023-02-26 NOTE — Consult Note (Signed)
Pharmacy Consult Note - Anticoagulation  Pharmacy Consult for heparin Indication:  limb ischemia  PATIENT MEASUREMENTS: Height: 5\' 7"  (170.2 cm) Weight: 64.9 kg (143 lb) IBW/kg (Calculated) : 66.1 HEPARIN DW (KG): 64.9  VITAL SIGNS: Temp: 97.9 F (36.6 C) (08/01 1211) Temp Source: Oral (08/01 1211) BP: 127/71 (08/01 1730) Pulse Rate: 100 (08/01 1730)  Recent Labs    02/26/23 1537  HGB 12.8*  HCT 38.6*  PLT 196  CREATININE 1.17    Estimated Creatinine Clearance: 58.6 mL/min (by C-G formula based on SCr of 1.17 mg/dL).  PAST MEDICAL HISTORY: 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    GERD (gastroesophageal reflux disease)    Hepatitis    chronic hep c   Hydronephrosis    Hydronephrosis with ureteral stricture    Hyperlipidemia    Insomnia, unspecified    Knee pain    Left   Major depressive disorder, recurrent, moderate (HCC)    Malignant neoplasm of colon (HCC)    Malnutrition (HCC)    Muscle weakness (generalized)    Nerve pain    Other abnormalities of gait and mobility    Other chronic pain    Peripheral vascular disease (HCC)    Personal history of transient cerebral ischemia    Pressure ulcer    Prostate cancer (HCC)    Stroke (HCC)    Tinea unguium    Tobacco user    Unspecified protein-calorie malnutrition (HCC)    Ureteral cancer, right (HCC)     Urinary frequency    Venous hypertension of both lower extremities    Xerosis cutis     ASSESSMENT: 62 y.o. male with PMH PAD, chronic left foot ulceration is presenting with worsening ulceration and necrosis. Patient is not on chronic anticoagulation per chart review. Pharmacy has been consulted to initiate and manage heparin intravenous infusion.  Pertinent medications: No chronic AC PTA  Goal(s) of therapy: Heparin level 0.3 - 0.7 units/mL Monitor platelets by anticoagulation protocol: Yes   Baseline anticoagulation labs: Recent Labs    02/26/23 1537  HGB 12.8*  PLT 196    Date Time aPTT/HL Rate/Comment     PLAN: Give 3900 units bolus x1; then start heparin infusion at 950 units/hour. Check heparin level in 6 hours, then daily once at least two levels are consecutively therapeutic. Monitor CBC daily while on heparin infusion.  Will M. Dareen Piano, PharmD Clinical Pharmacist 02/26/2023 6:07 PM

## 2023-02-26 NOTE — ED Provider Notes (Signed)
[facility] Provider Note    Event Date/Time   First MD Initiated Contact with Patient 02/26/23 1458     (approximate)   History   Wound Check   HPI  Dawone Jacko is a 62 y.o. male with a history of peripheral vascular disease, chronic left foot ulceration sent in from wound care center for worsening ulceration, necrosis.  Patient reports increased pain.  No fevers.  States that he has been on antibiotics but is not sure which ones     Physical Exam   Triage Vital Signs: ED Triage Vitals  Encounter Vitals Group     BP 02/26/23 1211 135/87     Systolic BP Percentile --      Diastolic BP Percentile --      Pulse Rate 02/26/23 1211 92     Resp 02/26/23 1211 18     Temp 02/26/23 1211 97.9 F (36.6 C)     Temp Source 02/26/23 1211 Oral     SpO2 02/26/23 1211 100 %     Weight --      Height --      Head Circumference --      Peak Flow --      Pain Score 02/26/23 1207 8     Pain Loc --      Pain Education --      Exclude from Growth Chart --     Most recent vital signs: Vitals:   02/26/23 1211  BP: 135/87  Pulse: 92  Resp: 18  Temp: 97.9 F (36.6 C)  SpO2: 100%     General: Awake, no distress.  CV:  Good peripheral perfusion.  Resp:  Normal effort.  Abd:  No distention.  Other:  Left foot with significant erythema with central ulceration with evidence of necrosis of the toe   ED Results / Procedures / Treatments   Labs (all labs ordered are listed, but only abnormal results are displayed) Labs Reviewed  CBC - Abnormal; Notable for the following components:      Result Value   Hemoglobin 12.8 (*)    HCT 38.6 (*)    All other components within normal limits  COMPREHENSIVE METABOLIC PANEL - Abnormal; Notable for the following components:   CO2 19 (*)    Glucose, Bld 101 (*)    BUN 38 (*)    Calcium 8.8 (*)    Albumin 3.1 (*)    All other components within normal limits  CULTURE, BLOOD (SINGLE)  LACTIC ACID, PLASMA   LACTIC ACID, PLASMA     EKG     RADIOLOGY     PROCEDURES:  Critical Care performed: yes  CRITICAL CARE Performed by: Jene Every   Total critical care time: 30 minutes  Critical care time was exclusive of separately billable procedures and treating other patients.  Critical care was necessary to treat or prevent imminent or life-threatening deterioration.  Critical care was time spent personally by me on the following activities: development of treatment plan with patient and/or surrogate as well as nursing, discussions with consultants, evaluation of patient's response to treatment, examination of patient, obtaining history from patient or surrogate, ordering and performing treatments and interventions, ordering and review of laboratory studies, ordering and review of radiographic studies, pulse oximetry and re-evaluation of patient's condition.   Procedures   MEDICATIONS ORDERED IN ED: Medications  vancomycin (VANCOCIN) IVPB 1000 mg/200 mL premix (has no administration in time range)  piperacillin-tazobactam (ZOSYN) IVPB 3.375 g (has no  administration in time range)     IMPRESSION / MDM / ASSESSMENT AND PLAN / ED COURSE  I reviewed the triage vital signs and the nursing notes. Patient's presentation is most consistent with acute presentation with potential threat to life or bodily function.  Patient presents with what appears to be a severe infection of the left foot with central ulceration, surrounding erythema and necrosis of the toe distally  He is afebrile likely, lab work reviewed and presentation is not consistent with sepsis, regardless I suspect this is a limb threatening infection combined with peripheral vascular disease.  Will give IV Vanco, IV Zosyn, have consulted the hospitalist service for admission        FINAL CLINICAL IMPRESSION(S) / ED DIAGNOSES   Final diagnoses:  Cellulitis of left foot  Gangrene of toe (HCC)     Rx / DC Orders    ED Discharge Orders     None        Note:  This document was prepared using Dragon voice recognition software and may include unintentional dictation errors.   Jene Every, MD 02/26/23 [phone]

## 2023-02-26 NOTE — Assessment & Plan Note (Signed)
Followed by Dr. Wyn Quaker On heparin drip in the setting of concern for limb ischemia of the left lower extremity

## 2023-02-26 NOTE — Assessment & Plan Note (Signed)
Noted worsening left lower extremity wound in the setting of chronic peripheral vascular disease followed by Dr. Wyn Quaker with vascular surgery as well as wound care Significant concern for limb ischemia Noted June 2024 arteriogram showing left anterior tibial and posterior tibial arteries were occluded without distal reconstitution  Case discussed with Dr. Gilda Crease with vascular surgery as well as Dr. Allena Katz with podiatry Will place patient on heparin drip as well as Zosyn and vancomycin for infectious coverage MRI of the foot with and without contrast to better assess Anticipate possible amputation of the left foot

## 2023-02-26 NOTE — ED Triage Notes (Signed)
See First Nurse note. Pt arrived via [facility]. Pt resides at [facility]. Pt sent here for evaluation of wound on left foot. Pt reports left leg pain. Pt A&Ox4.

## 2023-02-26 NOTE — Assessment & Plan Note (Signed)
s/p hemicolectomy 10/2022 w/ Dr. Maia Plan

## 2023-02-26 NOTE — Assessment & Plan Note (Signed)
Baseline High-grade urothelial cancer followed by Dr. Donneta Romberg

## 2023-02-26 NOTE — H&P (Signed)
History and Physical    Patient: Chad [name] ZOX:[id number] DOB: 1961-05-06 DOA: 02/26/2023 DOS: the patient was seen and examined on 02/26/2023 PCP: Everardo Beals, MD  Patient coming from: Home  Chief Complaint:  Chief Complaint  Patient presents with   Wound Check   HPI: Chad [name] is a 62 y.o. 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 Wyn Quaker on aspirin and Plavix, COPD, GERD, hepatitis C, hyperlipidemia, presenting with nonhealing left extremity wound, limb ischemia.  Patient followed by wound care, recurrently.  Was evaluated at the clinic today with concern for worsening of left lower extremity wound.  Noted to have had a arteriogram done back in June showing left anterior tibial and posterior tibial arteries were occluded without distal reconstitution.  1/4 to 1/2 pack/day smoker.  Worsening redness and pain.  No reported chest pain, shortness of breath, nausea or vomiting.  No fevers or chills. Presented to the ER afebrile, hemodynamically stable.  White count 8, hemoglobin 13, platelets 196.  Creatinine 1.2, lactate 1.5.  Started on empiric antibiotics in the ER. Review of Systems: As mentioned in the history of present illness. All other systems reviewed and are negative. Past Medical History:  Diagnosis Date   Anemia    Anxiety    ARF (acute respiratory failure) (HCC)    Atherosclerosis of arteries of extremities (HCC)    Benign prostatic hyperplasia with urinary obstruction    Bladder cancer (HCC)    BPH with obstruction/lower urinary tract symptoms    Chronic viral hepatitis C (HCC)    Colon cancer (HCC)    COPD (chronic obstructive pulmonary disease) (HCC)    Depression    Dysphagia    Family history of colon cancer    Family history of kidney cancer    Family history of leukemia    Family history of prostate cancer     Generalized anxiety disorder    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    GERD (gastroesophageal reflux disease)    Hepatitis    chronic hep c   Hydronephrosis    Hydronephrosis with ureteral stricture    Hyperlipidemia    Insomnia, unspecified    Knee pain    Left   Major depressive disorder, recurrent, moderate (HCC)    Malignant neoplasm of colon (HCC)    Malnutrition (HCC)    Muscle weakness (generalized)    Nerve pain    Other abnormalities of gait and mobility    Other chronic pain    Peripheral vascular disease (HCC)    Personal history of transient cerebral ischemia    Pressure ulcer    Prostate cancer (HCC)    Stroke (HCC)    Tinea unguium    Tobacco user    Unspecified protein-calorie malnutrition (HCC)    Ureteral cancer, right (HCC)    Urinary frequency    Venous hypertension of both lower extremities    Xerosis cutis    Past Surgical History:  Procedure Laterality Date   COLON SURGERY     En bloc extended right hemicolectomy 07/2017   COLONOSCOPY WITH PROPOFOL N/A 11/06/2020   Procedure: COLONOSCOPY WITH PROPOFOL;  Surgeon: Wyline Mood, MD;  Location: [facility] ENDOSCOPY;  Service: Gastroenterology;  Laterality: N/A;   COLONOSCOPY WITH PROPOFOL N/A 07/31/2021   Procedure: COLONOSCOPY WITH PROPOFOL;  Surgeon: Earline Mayotte, MD;  Location: [facility] ENDOSCOPY;  Service: Endoscopy;  Laterality:  N/A;   CYSTOSCOPY W/ RETROGRADES Right 08/30/2018   Procedure: CYSTOSCOPY WITH RETROGRADE PYELOGRAM;  Surgeon: Vanna Scotland, MD;  Location: [facility] ORS;  Service: Urology;  Laterality: Right;   CYSTOSCOPY WITH STENT PLACEMENT Right 04/25/2018   Procedure: CYSTOSCOPY WITH STENT PLACEMENT;  Surgeon: Vanna Scotland, MD;  Location: [facility] ORS;  Service: Urology;  Laterality: Right;   CYSTOSCOPY WITH STENT PLACEMENT Right 08/30/2018   Procedure: CYSTOSCOPY WITH STENT Exchange;  Surgeon: Vanna Scotland, MD;  Location: [facility] ORS;  Service: Urology;  Laterality: Right;    CYSTOSCOPY WITH STENT PLACEMENT Right 03/07/2019   Procedure: CYSTOSCOPY WITH STENT Exchange;  Surgeon: Vanna Scotland, MD;  Location: [facility] ORS;  Service: Urology;  Laterality: Right;   CYSTOSCOPY WITH STENT PLACEMENT Right 11/21/2019   Procedure: CYSTOSCOPY WITH STENT Exchange;  Surgeon: Vanna Scotland, MD;  Location: [facility] ORS;  Service: Urology;  Laterality: Right;   LOWER EXTREMITY ANGIOGRAPHY Left 05/23/2019   Procedure: LOWER EXTREMITY ANGIOGRAPHY;  Surgeon: Annice Needy, MD;  Location: [facility] INVASIVE CV LAB;  Service: Cardiovascular;  Laterality: Left;   LOWER EXTREMITY ANGIOGRAPHY Right 05/30/2019   Procedure: LOWER EXTREMITY ANGIOGRAPHY;  Surgeon: Annice Needy, MD;  Location: [facility] INV[facility] CV [facility];  Service: Cardiovascular;  Laterality: Right;   LOWER EXTREMITY ANGIOGRAPHY Right 02/13/2020   Procedure: LOWER EXTREMITY ANGIOGRAPHY;  Surgeon: Annice Needy, MD;  Location: [facility] INVAS[facility] CV LAB;  Service: Cardiovascular;  Laterality: Right;   LOWER EXTREMITY ANGIOGRAPHY Left 02/20/2020   Procedure: LOWER EXTREMITY ANGIOGRAPHY;  Surgeon: Annice Needy, MD;  Location: [facility] INV[facility] CV [facility];  Service: Cardiovascular;  Laterality: Left;   LOWER EXTREMITY ANGIOGRAPHY Left 01/01/2023   Procedure: Lower Extremity Angiography;  Surgeon: Annice Needy, MD;  Location: [facility] INVAS[facility] CV [facility];  Service: Cardiovascular;  Laterality: Left;   PORTA CATH INSERTION N/A 02/28/2019   Procedure: PORTA CATH INSERTION;  Surgeon: Annice Needy, MD;  Location: [facility] INVASIVE CV LAB;  Service: Cardiovascular;  Laterality: N/A;   ROBOT ASSISTED LAPAROSCOPIC PARTIAL COLECTOMY  11/17/2022   tumor removed      Social History:  reports that he has been smoking cigarettes. He has never used smokeless tobacco. He reports that he does not currently use alcohol. He reports that he does not currently use drugs after having used the following drugs: Marijuana and Methylphenidate.  Allergies  Allergen Reactions   Penicillins Rash     Family History  Problem Relation Age of Onset   Prostate cancer Father 22   Cancer Brother 94       unsure type   Cancer Paternal Uncle        unsure type   Cancer Maternal Grandmother        unsure type   Cancer Paternal Grandmother        unsure type   Kidney cancer Paternal Grandfather    Cancer Other        unsure types   Leukemia Son    Cancer Son        other cancers, possibly colon    Prior to Admission medications   Medication Sig Start Date End Date Taking? Authorizing Provider  acetaminophen (TYLENOL) 325 MG tablet Take 650 mg by mouth 3 (three) times daily.     [provider]  Amino Acids-Protein Hydrolys (PRO-STAT) LIQD Take 45 mLs by mouth daily.    [provider]  aspirin EC 81 MG tablet Take 1 tablet (81 mg total) by mouth daily. 05/23/19   Annice Needy, MD  atorvastatin (LIPITOR) 10 MG tablet Take 1 tablet (10 mg total) by mouth daily. 05/23/19 01/01/23  Annice Needy, MD  Calcium Carb-Cholecalciferol (CALCIUM CARBONATE-VITAMIN D3 PO) Take 2 tablets by mouth daily. 600-400 mg    [provider]  clopidogrel (PLAVIX) 75 MG tablet Take 75 mg by mouth daily.    [provider]  cyclobenzaprine (FEXMID) 7.5 MG tablet Take 15 mg by mouth at bedtime.    [provider]  escitalopram (LEXAPRO) 5 MG tablet Take 5 mg by mouth daily.    [provider]  ferrous fumarate (FERRETTS) 325 (106 Fe) MG TABS tablet Take 1 tablet by mouth.    [provider]  gabapentin (NEURONTIN) 100 MG capsule Take 100 mg by mouth 3 (three) times daily.    [provider]  mirtazapine (REMERON) 7.5 MG tablet Take 7.5 mg by mouth at bedtime.    [provider]  Multiple Vitamin (MULTIVITAMIN) tablet Take 1 tablet by mouth daily.    [provider]  oxycodone (OXY-IR) 5 MG capsule Take 10 mg by mouth every 6 (six) hours. 08/02/22   [provider]  Polyethyl Glycol-Propyl Glycol (SYSTANE) 0.4-0.3 %  SOLN Apply to eye.    [provider]  senna (SENOKOT) 8.6 MG TABS tablet Take 1 tablet by mouth at bedtime.    [provider]  senna-docusate (SENOKOT-S) 8.6-50 MG tablet Take 1 tablet by mouth daily.    [provider]  tamsulosin (FLOMAX) 0.4 MG CAPS capsule Take 1 capsule (0.4 mg total) by mouth daily after supper. 11/28/21   Earna Coder, MD  traMADol (ULTRAM) 50 MG tablet Take 1 tablet (50 mg total) by mouth every 6 (six) hours as needed. 11/20/22 11/20/23  Carolan Shiver, MD    Physical Exam: Vitals:   02/26/23 1211 02/26/23 1530 02/26/23 1630 02/26/23 1700  BP: 135/87 106/74 107/61 131/68  Pulse: 92 99 99 (!) 102  Resp: 18     Temp: 97.9 F (36.6 C)     TempSrc: Oral     SpO2: 100% 99% 98% 100%   Physical Exam Constitutional:      Comments: Cachectic, disheveled  HENT:     Head: Normocephalic and atraumatic.     Nose: Nose normal.     Mouth/Throat:     Mouth: Mucous membranes are moist.  Eyes:     Pupils: Pupils are equal, round, and reactive to light.  Cardiovascular:     Rate and Rhythm: Normal rate and regular rhythm.  Pulmonary:     Effort: Pulmonary effort is normal.  Abdominal:     General: Abdomen is flat. Bowel sounds are normal.  Skin:    Comments: Marked left lower extremity discoloration and ulcer formation across the third through fifth digits See picture  Neurological:     General: No focal deficit present.  Psychiatric:        Mood and Affect: Mood normal.         Data Reviewed:  There are no new results to review at this time.  Lab Results  Component Value Date   WBC 8.0 02/26/2023   HGB 12.8 (L) 02/26/2023   HCT 38.6 (L) 02/26/2023   MCV 83.0 02/26/2023   PLT 196 02/26/2023   Last metabolic panel Lab Results  Component Value Date   GLUCOSE 101 (H) 02/26/2023   NA 136 02/26/2023   K 4.0 02/26/2023   CL 106 02/26/2023   CO2 19 (L) 02/26/2023   BUN  38 (H) 02/26/2023   CREATININE 1.17  02/26/2023   GFRNONAA >60 02/26/2023   CALCIUM 8.8 (L) 02/26/2023   PHOS 2.7 05/01/2018   PROT 8.1 02/26/2023   ALBUMIN 3.1 (L) 02/26/2023   BILITOT 1.0 02/26/2023   ALKPHOS 88 02/26/2023   AST 32 02/26/2023   ALT 16 02/26/2023   ANIONGAP 11 02/26/2023    Assessment and Plan: * Non-healing wound of left lower extremity Noted worsening left lower extremity wound in the setting of chronic peripheral vascular disease followed by Dr. Wyn Quaker with vascular surgery as well as wound care Significant concern for limb ischemia Noted June 2024 arteriogram showing left anterior tibial and posterior tibial arteries were occluded without distal reconstitution  Case discussed with Dr. Gilda Crease with vascular surgery as well as Dr. Allena Katz with podiatry Will place patient on heparin drip as well as Zosyn and vancomycin for infectious coverage MRI of the foot with and without contrast to better assess Anticipate possible amputation of the left foot  Tobacco abuse 1/2 pack/day smoker  History of colon cancer s/p hemicolectomy 10/2022 w/ Dr. Maia Plan  Peripheral vascular disease of lower extremity with ulceration (HCC) Followed by Dr. Wyn Quaker On heparin drip in the setting of concern for limb ischemia of the left lower extremity  Chronic pain due to neoplasm Managed by palliative care Continue home oxycodone  Urothelial cancer (HCC) Baseline High-grade urothelial cancer followed by Dr. Donneta Romberg      Advance Care Planning:   Code Status: Full Code   Consults: Vascular surgery, Podiatry   Family Communication: No family at the bedside   Severity of Illness: The appropriate patient status for this patient is INPATIENT. Inpatient status is judged to be reasonable and necessary in order to provide the required intensity of service to ensure the patient's safety. The patient's presenting symptoms, physical exam findings, and initial radiographic and laboratory data in the context of their chronic  comorbidities is felt to place them at high risk for further clinical deterioration. Furthermore, it is not anticipated that the patient will be medically stable for discharge from the hospital within 2 midnights of admission.   * I certify that at the point of admission it is my clinical judgment that the patient will require inpatient hospital care spanning beyond 2 midnights from the point of admission due to high intensity of service, high risk for further deterioration and high frequency of surveillance required.*  Author: Floydene Flock, MD 02/26/2023 5:29 PM  For on call review [email or web address].

## 2023-02-26 NOTE — Assessment & Plan Note (Signed)
1/2 pack/day smoker

## 2023-02-26 NOTE — Progress Notes (Addendum)
MALEK, SKOG ([id number]) [id number] Page 1 of 15 Visit Report for 02/26/2023 Chief Complaint Document Details Patient Name: Date of Service: Chad [name], Chad [name] 02/26/2023 10:15 A M Medical Record Number: [id number] Patient Account Number: [id number] Date of Birth/Sex: Treating RN: 08-18-1960 (62 y.o. Chad [name] Primary Care Provider: [name] Other Clinician: Referring Provider: Treating Provider/Extender: [name] Weeks in Treatment: 84 Information Obtained from: Patient Chief Complaint Left ankle ulcer, Left dorsal foot wound, right heel Electronic Signature(s) Signed: 02/26/2023 10:54:19 AM By: [name] PA-C Entered By: [name] on 02/26/2023 10:54:19 -------------------------------------------------------------------------------- HPI Details Patient Name: Date of Service: Chad [name] 02/26/2023 10:15 A M Medical Record Number: [id number] Patient Account Number: [id number] Date of Birth/Sex: Treating RN: Mar 11, 1961 (62 y.o. Chad [name] Primary Care Provider: [name] Other Clinician: Referring Provider: Treating Provider/Extender: [name] Weeks in Treatment: 64 History of Present Illness HPI Description: 10/08/18 on evaluation today patient actually presents to our office for initial evaluation concerning wounds that he has of the bilateral lower extremities. 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 He also has bilateral venous stasis. He apparently has been homeless currently following discharge from the hospital apparently he has been placed at [facility] which is is a skilled nursing facility locally. Nonetheless fortunately he does not show any signs of infection at this time which is good news. In fact several of the wound  actually appears to be showing some signs of improvement already in my pinion. There are a couple areas in the left leg in particular there likely gonna require some sharp debridement to help clear away some necrotic tissue and help with more sufficient healing. No fevers, chills, nausea, or vomiting noted at this time. 10/15/18 on evaluation today patient actually appears to be doing very well in regard to his bilateral lower extremities. He's been tolerating the dressing changes without complication. Fortunately there does not appear to be any evidence of active infection at this time which is great news. Overall I'm actually very pleased with how this has progressed in just one visits time. Readmission: 08/14/2020 upon evaluation today patient presents for re-evaluation here in our clinic. He is having issues with his left ankle region as well as his right toe and his right heel. He tells me that the toe and heel actually began as a area that was itching that he was scratching and then subsequently opened up into wounds. These may have been abscess areas I presume based on what I am seeing currently. With regard to his left ankle region he tells me this was a similar type Chad [name], Chad [name] ([id number]) [id number] Page 2 of 15 occurrence although he does have venous stasis this very well may be more of a venous leg ulcer more than anything. Nonetheless I do believe that the patient would benefit from appropriate and aggressive wound care to try to help get things under better control here. He does have history of a stroke on the left side affecting him to some degree there that he is able to stand although he does have some residual weakness. Otherwise again the patient does have chronic venous insufficiency as previously noted. His arterial studies most recently obtained showed that he had an ABI on the right of 1.16 with a TBI of 0.52 and on the left and ABI of 1.14 with  a  TBI of 0.81. That was obtained on 06/19/2020. 08/28/2020 upon evaluation today patient appears to be doing decently well in regard to his wounds in general. He has been tolerating the dressing changes without complication. Fortunately there does not appear to be any signs of active infection which is great news. With that being said I think the [facility] is doing a good job I would recommend that we likely continue with that currently. 09/11/2020 upon evaluation today patient's wounds did not appear to be doing too poorly but again he is not really showing signs of significant improvement with regard to any of the wounds on the right. None of them have Hydrofera Blue on them I am not exactly sure why this is not being followed as the facility did not contact us to let us know of any issues with obtaining dressings or otherwise. With that being said he is supposed to be using Hydrofera Blue on both of the wounds on the right foot as well as the ankle wound on the left side. 09/18/2020 upon evaluation today patient appears to be doing poorly with regard to his wounds. Again right now the left ankle in particular showed signs of extreme maceration. Apparently he was told by someone with staff at [facility] they could not get the [facility]. With that being said this is something that is never been relayed to [place] one way or another. Also the patient subsequently has not supposed to have a border gauze dressing on. He should have an ABD pad and roll gauze to secure as this drains much too much just to have a border gauze dressing to cover. Nonetheless the fact that they are not using the appropriate dressing is directly causing deterioration of the left ankle wound it is significantly worse today compared to what it was previous. I did attempt to call [facility] while the patient was here I called three times and got no one to even pick up the phone. After this I had my for an  office coordinator call and she was able to finally get through and leave a message with the D ON as of dictation of this note which is roughly about an hour and a half later I still have not been able to speak with anyone at the facility. 09/25/2020 upon evaluation today patient actually showing signs of good improvement which is excellent news. He has been tolerating the dressing changes without complication. Fortunately there is no signs of active infection which is great news. No fevers, chills, nausea, vomiting, or diarrhea. I do feel like the facility has been doing a much better job at taking care of him as far as the dressings are concerned. However the director of nursing never did call me back. 10/09/2020 upon evaluation today patient appears to be doing well with regard to his wound. The toe ulcer did require some debridement but the other 2 areas actually appear to be doing quite well. 10/19/2020 upon evaluation today patient actually appears to be doing very well in regard to his wounds. In fact the heel does appear to be completely healed. The toe is doing better in the medial ankle on the left is also doing better. Overall I think he is headed in the right direction. 10/26/2020 upon evaluation today patient appears to be doing well with regard to his wound. He is showing signs of improvement which is great news and overall I am very pleased with where things stand today. No  fevers, chills, nausea, vomiting, or diarrhea. 11/02/2020 upon evaluation today patient appears to be doing well with regard to his wounds. He has been tolerating the dressing changes without complication overall I am extremely pleased with where things stand today. He in regard to the toe is almost completely healed and the medial ankle on the left is doing much better. 11/09/2020 upon evaluation today patient appears to be doing a little poorly in regard to his left medial ankle ulcer. Fortunately there does not appear to  be any signs of systemic infection but unfortunately locally he does appear to be infected in fact he has blue-green drainage consistent with Pseudomonas. 11/16/2020 upon evaluation today patient appears to be doing well with regard to his wound. It actually appears to be doing better. I did place him on gentamicin cream since the Cipro was actually resistant even though he was positive for Pseudomonas on culture. Overall I think that he does seem to be doing better though I am unsure whether or not they have actually been putting the cream on. The patient is not sure that we did talk to the nurse directly and she was going to initiate that treatment. Fortunately there does not appear to be any signs of active infection at this time. No fevers, chills, nausea, vomiting, or diarrhea. 4/28; the area on the right second toe is close to healed. Left medial ankle required debridement 12/07/2020 upon evaluation today patient appears to be doing well with regard to his wounds. In fact the right second toe appears to be completely healed which is great news. Fortunately there does not appear to be any signs of active infection at this time which is also great news. I think we can probably discontinue the gentamicin on top of everything else. 12/14/2020 upon evaluation today patient appears to be doing well with regard to his wound. He is making good progress and overall very pleased with where things stand today. There is no signs of active infection at this time which is great news. 12/28/2020 upon evaluation today patient appears to be doing well with regard to his wounds. He has been tolerating the dressing changes without complication. Fortunately there is no signs of active infection at this time. No fevers, chills, nausea, vomiting, or diarrhea. 12/28/2020 upon evaluation today patient's wound bed actually showed signs of excellent improvement. He has great epithelization and granulation I do not see any  signs of infection overall I am extremely pleased with where things stand at this point. No fevers, chills, nausea, vomiting, or diarrhea. 01/11/2021 upon evaluation today patient appears to be doing well with regard to his wound on his leg. He has been tolerating the dressing changes without complication. Fortunately there does not appear to be any signs of active infection which is great news. No fevers, chills, nausea, vomiting, or diarrhea. 01/25/2021 upon evaluation today patient appears to be doing well with regard to his wound. He has been tolerating the dressing changes without complication. Fortunately the collagen seems to be doing a great job which is excellent news. No fevers, chills, nausea, vomiting, or diarrhea. 02/08/2021 upon evaluation today patient's wound is actually looking a little bit worse especially in the periwound compared to previous. Fortunately there does not appear to be any signs of infection which is great news with that being said he does have some irritation around the periphery of the wound which has me more concerned. He actually had a dressing on that had not been changed in 3  days. He also is supposed to have daily dressing changes. With regard to the dressing applied he had a silver alginate dressing and silver collagen is what is recommended and ordered. He also had no Desitin around the edges of the wound in the periwound region although that is on the order inspect to be done as well. In general I was very concerned I did contact [facility] actually spoke with Verlon Au who is the scheduling individual and subsequently she stated that she would pass the information to the D ON apparently the D ON was not available to talk to me when I call today. 02/18/2021 upon evaluation today patient's wound is actually showing signs of improvement. Fortunately there does not appear to be any evidence of infection which is great news overall I am extremely pleased with  where things stand today. No fevers, chills, nausea, vomiting, or diarrhea. 8/3; patient presents for 1 week follow-up. He has no issues or complaints today. He denies signs of infection. 03/11/2021 upon evaluation today patient appears to be doing well with regard to his wound. He does have a little bit of slough noted on the surface of the wound but fortunately there does not appear to be any signs of active infection at this time. No fevers, chills, nausea, vomiting, or diarrhea. 03/18/2021 upon evaluation today patient appears to be doing well with regard to his wound. He has been tolerating the dressing changes without complication. There was a little irritation more proximal to where the wound was that was not noted last week but nonetheless this is very superficial just seems to be more irritation we just need to make sure to put a good amount of the zinc over the area in my opinion. Otherwise he does not seem to be doing significantly worse at all which is great news. 03/25/2021 upon evaluation today patient appears to be doing well with regard to his wound. He is going require some sharp debridement today to clear with Chad [name], Chad [name] ([id number]) [id number] Page 3 of 15 some of the necrotic debris. I did perform this today without complication postdebridement wound bed appears to be doing much better this is great news. 04/08/2021 upon evaluation today patient appears to be doing decently well in regard to his wound although the overall measurement is not significantly smaller compared to previous. It is gone down a little bit but still the facility continues to not really put the appropriate dressings in place in fact he was supposed to have collagen we think he probably had more of an allergy to At this point. Fortunately there does not appear to be any signs of active infection systemically though locally I do not see anything on initial visualization either as  far as erythema or warmth. 04/15/2021 upon evaluation today patient appears to be doing well with regard to his wound. He is actually showing signs of improvement. I did place him on antibiotics last week, Cipro. He has been taking that 2 times a day and seems to be tolerating it very well. I do not see any evidence of worsening and in fact the overall appearance of the wound is smaller today which is also great news. 9/26; left medial ankle chronic venous insufficiency wound is improved. Using Hydrofera Blue 10/10; left medial ankle chronic venous insufficiency. Wound has not changed much in appearance completely nonviable surface. Apparently there have been problems getting the right product on the wound at the facility although he came in with [facility] on today  05/14/2021 upon evaluation today patient appears to be doing well with regard to his wound. I think he is making progress here which is good news. Fortunately there does not appear to be any signs of active infection at this time. No fevers, chills, nausea, vomiting, or diarrhea. 05/20/2021 upon evaluation today patient appears to be doing well with regard to his wound. He is showing signs of good improvement which is great news. There does not appear to be any evidence of active infection which is also excellent news. No fevers, chills, nausea, vomiting, or diarrhea. 05/28/2021 upon evaluation today patient appears to be doing quite well. There does not appear to be any signs of active infection at this time which is great news. Overall I am extremely pleased with where things stand today. I think he is headed in the right direction. 06/11/2021 upon evaluation today patient appears to be doing well with regard to his left ankle ulcer and poorly in regard to the toe ulcer on the second toe right foot. This appears to show signs of joint exposure. Apparently this has been present for 1 to 2 months although he kept forgetting to tell me  about it. That is unfortunate as right now it definitely appears to be doing significantly worse than what I would like to see. There does not appear to be any signs of active infection systemically though locally I am concerned about the possibility of infection the toe is quite red. Again no one from the facility ever contacted [place] to advise that this was going on in the interim either. 06/17/2021 upon evaluation today patient presents for follow-up I did review his x-ray which showed a navicular bone fracture I am unsure of the chronicity of this. Subsequently he also had osteomyelitis of the toe which was what I was more concerned about this did not show up on x-ray but did show up on the pathology scrapings. This was listed as acute osteomyelitis. Nonetheless at this point I think that the antibiotic treatment is the best regimen to go with currently. The patient is in agreement with that plan. Nonetheless he has initially 30 days of doxycycline off likely extend that towards the end of the treatment cycle that will be around the middle of December for an additional 2 weeks. That all depends on how well he continues to heal. Nonetheless based on what I am seeing in the foot I did want a proceed with an MRI as well which I think will be helpful to identify if there is anything else that needs to be addressed from the standpoint of infection. 06/24/2021 upon evaluation today patient appears to be doing pretty well in regards to his wounds. I think both are actually showing signs of improvement which is good I did review his MRI today which did show signs of osteomyelitis of the middle and proximal phalanx on his right foot of the affected toe. With that being said this is actually showing signs of significant improvement today already with the antibiotic therapy I think the redness is also improved. Overall I think that we just need to give this some time with appropriate wound care we will see how  things go potentially hyperbarics could be considered. 07/02/2021 upon inspection today patient actually appears to be doing well in regard to his left ankle which is getting very close to complete resolution of pleased in that regard. Unfortunately he is continuing to have issues with his second toe right foot and this seems  to still be very painful for him. Recommend he try something different from the standpoint of antibiotics. 07/15/2021 upon evaluation today patient appears to be doing actually pretty well in regard to his foot. This is actually showing signs of significant improvement which is great news. Overall I feel like the patient is improving both in regard to the second toe as well as the ankle on the left. With that being said the biggest issue that I do see currently is that he is needing to have a refill of the doxycycline that we previously treated him with. He also did see podiatry they are not going to recommend any amputation at this point since he seems to be doing quite well. For that reason we just need to keep things under control from an infection standpoint. 08/01/2021 upon evaluation today patient appears to be doing well with regard to his wound. He has been tolerating the dressing changes without complication. Fortunately there does not appear to be any evidence of active infection locally nor systemically at this point. In fact I think everything is doing excellent in fact his second toe on the right foot is almost healed and the ankle on the left ankle region is actually very close to being healed as well. 08/08/2021 upon evaluation today patient appears to be doing well with regard to his wound. He has been tolerating the dressing changes without complication. Fortunately I do not see any signs of active infection at this time. Readmission: 12-06-2021 upon evaluation today patient presents for reevaluation here in the clinic he does tell me that he was being seen in  facility at [facility] by a provider that was coming in. He is not sure who this was. He tells me however that the wound seems to have gotten worse even compared to where it was when we last saw him at this point. With that being said I do believe that he is likely going need ongoing wound care here in the clinic and I do believe that we need to be the ones to frontline this since his wound does seem to be getting worse not better at this point. He voiced understanding. He is also in agreement with this plan and feels more comfortable coming here she tells me. Patient's medical history really has not changed since his prior admission he was only gone since January. 12-27-2021 upon evaluation today patient appears to be doing well with regard to his wound they did run out of the [facility] so they did not put anything on just an ABD pad with gentamicin. Still we are seeing some signs of good improvement here with some new epithelization which is great news. 01-10-2022 upon evaluation today patient appears to be doing well with regard to his wounds and he is going require some sharp debridement but overall seems to be making good progress. Fortunately I do not see any evidence of active infection locally or systemically at this time which is great news. 01-24-2022 upon evaluation today patient appears to be doing well with regard to his wound. The facility actually came and dropped him off early and he had another appointment at the hospital and then they just brought him over here and this was still hours before his appointment this afternoon. For that reason we did do our best to work him in this morning and fortunately had some space to make this happen. With that being said patient's wound does seem to be making progress here and I  am very pleased in that regard I do not see any signs of active infection locally or systemically at this time. 02-07-2022 patient appears to be doing well  currently in regard to his wounds. In fact one of them the more proximal is healed the distal is still open but seems to be doing excellent. Fortunately I do not see any evidence of active infection locally or systemically at this time which is great news. No fevers, chills, nausea, vomiting, or diarrhea. 7/27; left medial ankle venous. Improving per our intake nurse. We are using Hydrofera Blue under Tubigrip compression. They are changing that at his facility 03-06-2022 upon evaluation today patient appears to be doing well with regard to the his wound he is can require some sharp debridement but seems to be making excellent progress. Fortunately I do not see any evidence of active infection locally or systemically which is great news. 03-27-2022 upon evaluation today patient's wound is actually showing signs of excellent improvement. Fortunately I see no signs of active infection locally or systemically at this time which is great news. No fevers, chills, nausea, vomiting, or diarrhea. Chad [name], Chad [name] ([id number]) [id number] Page 4 of 15 04-21-2022 upon evaluation today patient appears to be doing excellent in regard to his wound in fact this is very close to resolution based on what I am seeing. I do not see any evidence of active infection locally or systemically at this time which is great news and overall I am extremely pleased with where we are today. 05-05-2022 upon evaluation today patient's wound actually appears to potentially be completely healed. Fortunately I do not see any evidence of active infection at this time which is great news and overall I am very pleased I think this needs a little time to toughen up but other than that I really do believe were doing quite well. He is very pleased to hear this its been a long time coming. 05-19-2022 upon evaluation today patient appears to be doing well currently in regard to his wound. He in fact we were hoping will  be completely healed and closed but it still has a very tiny area right in the center which is still continuing to drain. Fortunately I do not see any signs of infection locally or systemically which is great news. 11/6; this wound is close to completely" he comes in this week with some erythema at 1 edge of this which looks like something was rubbing on the area. Other than that no evidence of infection 06-16-2022 upon evaluation today patient appears to be doing well currently in regard to his ankle ulcer. This is very close to being healed but still has a small area in the central portion which is not. Fortunately there does not appear to be any signs of infection locally or systemically which is great news. No fevers, chills, nausea, vomiting, or diarrhea. 06-30-2022 upon evaluation patient's wound pretty much appears to be almost completely closed. In fact it may even be closed but there are still a lot of skin irritation around the edges of the wound and I just do not feel great about releasing them yet I would like to continue to monitor this just a little bit longer before getting to that point. He is not opposed to this and in fact is happy to continue to come in if need be in order to make sure that things are moving in the right direction he definitely does not want to backtrack. 07-14-2022 upon evaluation today patient  appears to be doing well currently in regard to his wound. Has been tolerating the dressing changes without complication. Fortunately I see no evidence of active infection locally nor systemically which is great news. No fevers, chills, nausea, vomiting, or diarrhea. 08-11-2022 upon evaluation today patient appears to be doing a little worse than last time I saw him although it has been a month. Unfortunately he was not able to be seen due to the fact that he was quarantined in the facility with COVID. Subsequently he tells me that he mention to the facility staff several  times about taking it off over the past several weeks but they continue to tell him that someone have to get in touch with [place] here although nobody ever did until Friday when the nurse manager called to have that documented in the communication notes. With that being said unfortunately this means that he had a wrap on that he was supposed to have on for only 1 week for ended up being on four 1 month essentially. I am glad that there is nothing worse than what we see currently to be perfectly honest. 08-19-2022 upon evaluation today patient appears to be doing well currently in regard to his wounds. He has been tolerating the dressing changes without complication. This is actually smaller today which is great news after last week's reopening. Fortunately I do not see any evidence of infection locally nor systemically at this point. 08-26-2022 upon evaluation today patient appears to be doing well currently in regard to his wound. He has been tolerating the dressing changes without complication. Fortunately there does not appear to be any signs of active infection locally nor systemically which is great news. No fevers, chills, nausea, vomiting, or diarrhea. 09-02-2022 upon evaluation today patient's wound actually showing signs of excellent improvement this is measuring smaller and looking much better. I am very pleased with where we stand I do believe that we are headed in the right direction. 09-09-2022 upon evaluation today patient appears to be doing decently well in regard to his leg in general although there was some swelling this is the 1 thing that I am not too happy with based on what I see currently. Fortunately there does not appear to be any signs of infection locally nor systemically at this point. 09-16-2022 upon evaluation today patient appears to be doing well currently in regard to his wound. He is actually showing signs of improvement he wore the Tubigrip this week and it significantly  better compared to last week's evaluation. Fortunately I do not see any evidence of active infection locally or systemically which is great news. Overall I think that were headed in the right direction which is great news. In fact overall I think this is the best that he has looked in some time. He does not like the Tubigrip but actually did extremely well with this in my opinion. I think he needs to continue to utilize this. 09-23-2022 upon evaluation today patient appears to be doing well currently in regard to his wound. He has been tolerating the dressing changes without complication. Fortunately there does not appear to be any signs of active infection locally nor systemically which is great news and in general I do feel like that we are headed in the right direction. He has been having trouble with the Tubigrip however we have gotten the facility to order compression socks he has not gotten them as of yet. 3/4; patient is about the same with a wound  on the left posterior leg. This is a venous wound. He needs more compression on this leg but he took off the previous wraps we are using. I am not certain whether he is using juxta lite stockings at the nursing home. 3/12; patient presents for follow-up. He unfortunately has 2 new wounds 1 to the dorsal left side and the other to the right heel. He is not sure how these happened. The right heel appears to be caused by pressure. He has been using Hydrofera Blue to the original wound on the left leg. 10-14-2022 upon evaluation today patient appears to be doing well currently in regard to his original wound but unfortunately he has several new wounds which I was completely unaware of before walking into the room to see him today. Unfortunately he seems to not be doing nearly as well as what I saw when I last had an appointment with him 3 weeks ago. Fortunately I do not see any signs of systemic infection though unfortunately locally he has definite  infection of the left lower extremity. He is currently on doxycycline I am going to likely have to extend that today. 10-21-2022 upon evaluation today patient appears to be doing well currently in regard to his wounds. He seems to be tolerating the dressing changes without complication. Fortunately there does not appear to be any signs of active infection locally or systemically which is great news and in general I definitely feel like we are on the right track here. 10-30-2022 upon evaluation today patient still continues to not be doing too well at all with regard to his wounds. Unfortunately he is still having signs of infection as well which also has been concerned. Fortunately I do not see any signs of active infection locally nor systemically at this time. No fevers, chills, nausea, vomiting, or diarrhea. 11-06-2022 upon evaluation today patient appears to be doing okay currently in regard to his wounds. All things considered with his blood flow not being nearly as good as what should I think that he is making pretty good progress here. Fortunately I do not see any signs of infection locally nor systemically which is great news. No fevers, chills, nausea, vomiting, or diarrhea. 11-13-2022 upon evaluation today patient appears to be doing poorly currently in regard to his wounds. Things seem to be getting a little bit worse not better. 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 Subsequently he is also going to need to have vascular intervention but we are unsure exactly when this is going on the undertaking. I would try to actually send a message to Dr. Hazle Quant and the vascular team just to see what exactly the plan is going forward. The patient is in agreement with that plan I just think we need to have a plan especially in light of the fact the left leg seems to be getting a little bit worse at this  point. 11-25-2022 upon evaluation today patient is currently now discharged from the hospital following his colostomy procedure. That did very well and he seems to [facility], [name] ([id number]) [id number] Page 5 of 15 be doing well. Fortunately there does not appear to be any signs of active infection locally or systemically which is great news. No fevers, chills, nausea, vomiting, or diarrhea. 12-04-2022 upon evaluation today patient appears to be doing actually decently well in regard to his wounds and actually very pleased  with where we stand and I think that he is making some pretty good progress here. Fortunately I do not see any signs of active infection locally nor systemically which is great news. No fevers, chills, nausea, vomiting, or diarrhea. 12-12-2022 upon evaluation today patient appears to be doing well currently in regard to his wound. He has been tolerating the dressing changes without complication. The wounds all seem to be making some progress to a degree. He has appointment with vascular on the 23rd of this month. 5/24; the patient was scheduled for a left lower extremity angiogram yesterday but apparently they were overbooked and they wanted to admit him to hospital last night so they could do this early this morning but he refused I see that Dr. Wyn Quaker has rescheduled him for June 6. He is using silver alginate to wounds on his bilateral lower extremities. 01-02-2023 upon evaluation today patient appears to be doing poorly in regard to his wounds still. Subsequently he did have his angiogram but apparently there really was not much that was found that could be revascularized. The patient tells me that Dr. Wyn Quaker was going to contact me here in the office I have not heard from him as of yet and I may see about giving him a call just to follow-up or possibly send a message through epic which could be an option as well. 01-09-2023 upon evaluation today patient  appears to be doing poorly in regard to his legs he is extremely swollen. He has been sleeping in his chair he tells me for the past week. His bed is broken and they have ordered a new one which is supposedly supposed to be there today. With that being said he the way he needs to be not sleeping in his chair that is his wheelchair every day and through the night. This is causing his legs to be much more swollen and it seen and how badly wounds are doing at this point. This has made things significantly worse. Fortunately I do not see any signs of infection right now unfortunately I do believe that the patient has a tremendous amount of drainage currently. 01-19-2023 upon evaluation today patient appears to be doing poorly in regard to his wounds of his legs his swelling is better but still not as good as what we would like to see. Fortunately I do not see any evidence of active infection locally nor systemically which is great news and in general I do believe that we are moving in the right direction here. Nonetheless it would be better if she was in the bed sleeping or not send up in his wheelchair although he seems to still be set up in the wheelchair at this time. He tells me that he is just not as comfortable in the bed as the wheelchair but unfortunately this is causing damage to his legs which I discussed with him today as well. Nonetheless I am not sure that he is going to be amendable to doing anything different. 01-27-2023 upon evaluation today patient appears to be doing well currently. Fortunately I do not see any evidence of active infection locally nor systemically which is great news I think he is actually doing the best option and while in fact I would try to see if we can do some debridement today to get things moving along. 02-03-2023 upon evaluation today patient's wounds unfortunately are not doing nearly as well. Will be doing the [employer] wraps unfortunately we just not seeing the  improvement that I would like to see. Fortunately I do not see any evidence of infection at this time which is good news unfortunately I do think that he needs to be changed on a daily basis due to the amount of drainage otherwise I do not think this is really going to continue to improve dramatically. 02-12-2023 upon evaluation today patient appears to be doing better in a lot of ways in regard to his wounds and although his toes on the left side actually seem a bit worse. Fortunately I do not see any evidence of active infection locally nor systemically at this time which is good news. With that being said he is having some bleeding from the left leg I think that this is an area that looks like it probably got bumped. With that being said I discussed this with the patient he tells me he does not know of any trauma that occurred but at the same time if was not this that it was skin that got pulled off. Dressing change there is only one of the 2 that could have happened. 7/25; this is a patient who is very frail at [facility] healthcare nursing facility. He is nonambulatory. He has wounds on his bilateral lower legs. However much worse on the left we have been using silver cell to all of his wounds He comes in today and a lot more pain our intake nurse reports deterioration of the left foot. I ended up looking through some of his record. 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 I am really not certain of the stage of  this currently or who is following 02-26-2023 upon evaluation today patient unfortunately appears to be doing significantly worse compared to where he was previous. The left foot is showing signs of dramatic worsening in regard to the overall appearance his toes are starting to become necrotic almost in full and he has much more cyanosis present than what I even seen previous. He is already seeing Dr. Wyn Quaker and unfortunately there was not anything that they saw that was an immediate cause for this but at the same time I feel like this may have changed and even worsened I feel like this could be a vascular event and I believe this is a limb-threatening situation. Electronic Signature(s) Signed: 02/26/2023 11:16:05 AM By: [name] PA-C Entered By: [name] on 02/26/2023 11:16:05 -------------------------------------------------------------------------------- Physical Exam Details Patient Name: Date of Service: Chad [name], Chad [name] 02/26/2023 10:15 A M Medical Record Number: [id number] Patient Account Number: [id number] Date of Birth/Sex: Treating RN: 1961/05/26 (62 y.o. Chad [name] Primary Care Provider: [name] Other Clinician: [name] ([id number]) [id number] Page 6 of 15 Referring Provider: Treating Provider/Extender: [name] in Treatment: 40 Constitutional Well-nourished and well-hydrated in no acute distress. Respiratory normal breathing without difficulty. Psychiatric this patient is able to make decisions and demonstrates good insight into disease process. Alert and Oriented x 3. pleasant and cooperative. Notes Upon inspection patient's wounds again appear to be doing worse really on both lower extremities I do not  like the coloring in the mottling that I am seeing but at the same time the left is definitely significantly worse than the right. I do believe the patient needs to go to the ER for further evaluation as soon  as possible. Electronic Signature(s) Signed: 02/26/2023 11:16:19 AM By: [name] PA-C Entered By: [name] on 02/26/2023 11:16:19 -------------------------------------------------------------------------------- Physician Orders Details Patient Name: Date of Service: Chad [name], Chad [name] 02/26/2023 10:15 A M Medical Record Number: [id number] Patient Account Number: [id number] Date of Birth/Sex: Treating RN: 03-31-1961 (62 y.o. 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 Nurse Visit as needed Bathing/ Shower/ Hygiene May shower with wound dressing protected with water repellent cover or cast protector. No tub bath. Anesthetic (Use 'Patient Medications' Section for Anesthetic Order Entry) Lidocaine applied to wound bed Edema Control - Lymphedema / Segmental Compressive Device / Other Elevate legs to the level of the heart and pump ankles as often as possible Elevate leg(s) parallel to the floor when sitting. Other: - PATIENT MUST SLEEP IN A BED. Legs are swelling due to no elevation of legs. Off-Loading Heel suspension boot - when in bed [place], [name] ([id number]) [phone] Page 7 of 15 Medications-Please add to medication  list. Other: - apply AandD ointment to leg, do not apply to wound Wound Treatment Wound #12 - Ankle Wound Laterality: Left, Medial, Distal Cleanser: Soap and Water 1 x Per Day/30 Days Discharge Instructions: Gently cleanse wound with antibacterial soap, rinse and pat dry prior to dressing wounds Cleanser: Wound Cleanser 1 x Per Day/30 Days Discharge Instructions: Wash your hands with soap and water. Remove old dressing, discard into plastic bag and place into trash. Cleanse the wound with Wound Cleanser prior to applying a clean dressing using gauze sponges, not tissues or cotton balls. Do not scrub or use excessive force. Pat dry using gauze sponges, not tissue or cotton balls. Prim Dressing: Silvercel Small 2x2 (in/in) 1 x Per Day/30 Days ary Discharge Instructions: Apply Silvercel Small 2x2 (in/in) as instructed Secondary Dressing: ABD Pad 5x9 (in/in) 1 x Per Day/30 Days Discharge Instructions: Cover with ABD pad Secured With: Kerlix Roll Sterile or Non-Sterile 6-ply 4.5x4 (yd/yd) 1 x Per Day/30 Days Discharge Instructions: Apply Kerlix as directed Wound #15 - Foot Wound Laterality: Dorsal, Left Cleanser: Soap and Water 1 x Per Day/30 Days Discharge Instructions: Gently cleanse wound with antibacterial soap, rinse and pat dry prior to dressing wounds Cleanser: Wound Cleanser 1 x Per Day/30 Days Discharge Instructions: Wash your hands with soap and water. Remove old dressing, discard into plastic bag and place into trash. Cleanse the wound with Wound Cleanser prior to applying a clean dressing using gauze sponges, not tissues or cotton balls. Do not scrub or use excessive force. Pat dry using gauze sponges, not tissue or cotton balls. Prim Dressing: Silvercel Small 2x2 (in/in) 1 x Per Day/30 Days ary Discharge Instructions: Apply Silvercel Small 2x2 (  in/in) as instructed Secondary Dressing: ABD Pad 5x9 (in/in) 1 x Per Day/30 Days Discharge Instructions: Cover with ABD pad Secured With:  Kerlix Roll Sterile or Non-Sterile 6-ply 4.5x4 (yd/yd) 1 x Per Day/30 Days Discharge Instructions: Apply Kerlix as directed Wound #16 - Calcaneus Wound Laterality: Right Cleanser: Soap and Water 1 x Per Day/30 Days Discharge Instructions: Gently cleanse wound with antibacterial soap, rinse and pat dry prior to dressing wounds Cleanser: Wound Cleanser 1 x Per Day/30 Days Discharge Instructions: Wash your hands with soap and water. Remove old dressing, discard into plastic bag and place into trash. Cleanse the wound with Wound Cleanser prior to applying a clean dressing using gauze sponges, not tissues or cotton balls. Do not scrub or use excessive force. Pat dry using gauze sponges, not tissue or cotton balls. Prim Dressing: Silvercel Small 2x2 (in/in) 1 x Per Day/30 Days ary Discharge Instructions: Apply Silvercel Small 2x2 (in/in) as instructed Secondary Dressing: ABD Pad 5x9 (in/in) 1 x Per Day/30 Days Discharge Instructions: Cover with ABD pad Secured With: Kerlix Roll Sterile or Non-Sterile 6-ply 4.5x4 (yd/yd) 1 x Per Day/30 Days Discharge Instructions: Apply Kerlix as directed Wound #17 - Lower Leg Wound Laterality: Left, Anterior Cleanser: Soap and Water 1 x Per Day/30 Days Discharge Instructions: Gently cleanse wound with antibacterial soap, rinse and pat dry prior to dressing wounds Cleanser: Wound Cleanser 1 x Per Day/30 Days Discharge Instructions: Wash your hands with soap and water. Remove old dressing, discard into plastic bag and place into trash. Cleanse the wound with Wound Cleanser prior to applying a clean dressing using gauze sponges, not tissues or cotton balls. Do not scrub or use excessive force. Pat dry using gauze sponges, not tissue or cotton balls. Prim Dressing: Silvercel Small 2x2 (in/in) 1 x Per Day/30 Days ary Discharge Instructions: Apply Silvercel Small 2x2 (in/in) as instructed Secondary Dressing: ABD Pad 5x9 (in/in) 1 x Per Day/30 Days Discharge  Instructions: Cover with ABD pad Secured With: Kerlix Roll Sterile or Non-Sterile 6-ply 4.5x4 (yd/yd) 1 x Per Day/30 Days Discharge Instructions: Apply Kerlix as directed CHIOKE, NOXON ([id number]) [id number] Page 8 of 15 Wound #18 - T Second oe Wound Laterality: Right, Medial Cleanser: Soap and Water 1 x Per Day/30 Days Discharge Instructions: Gently cleanse wound with antibacterial soap, rinse and pat dry prior to dressing wounds Cleanser: Wound Cleanser 1 x Per Day/30 Days Discharge Instructions: Wash your hands with soap and water. Remove old dressing, discard into plastic bag and place into trash. Cleanse the wound with Wound Cleanser prior to applying a clean dressing using gauze sponges, not tissues or cotton balls. Do not scrub or use excessive force. Pat dry using gauze sponges, not tissue or cotton balls. Prim Dressing: Silvercel Small 2x2 (in/in) 1 x Per Day/30 Days ary Discharge Instructions: Apply Silvercel Small 2x2 (in/in) as instructed Secondary Dressing: ABD Pad 5x9 (in/in) 1 x Per Day/30 Days Discharge Instructions: Cover with ABD pad Secured With: Kerlix Roll Sterile or Non-Sterile 6-ply 4.5x4 (yd/yd) 1 x Per Day/30 Days Discharge Instructions: Apply Kerlix as directed Wound #19 - Lower Leg Wound Laterality: Left, Posterior Cleanser: Soap and Water 1 x Per Day/30 Days Discharge Instructions: Gently cleanse wound with antibacterial soap, rinse and pat dry prior to dressing wounds Cleanser: Wound Cleanser 1 x Per Day/30 Days Discharge Instructions: Wash your hands with soap and water. Remove old dressing, discard into plastic bag and place into trash. Cleanse the wound with Wound Cleanser prior to applying a clean dressing using gauze  sponges, not tissues or cotton balls. Do not scrub or use excessive force. Pat dry using gauze sponges, not tissue or cotton balls. Prim Dressing: Silvercel Small 2x2 (in/in) 1 x Per Day/30 Days ary Discharge  Instructions: Apply Silvercel Small 2x2 (in/in) as instructed Secondary Dressing: ABD Pad 5x9 (in/in) 1 x Per Day/30 Days Discharge Instructions: Cover with ABD pad Secured With: Kerlix Roll Sterile or Non-Sterile 6-ply 4.5x4 (yd/yd) 1 x Per Day/30 Days Discharge Instructions: Apply Kerlix as directed Wound #20 - Foot Wound Laterality: Left, Medial, Proximal Cleanser: Soap and Water 1 x Per Day/30 Days Discharge Instructions: Gently cleanse wound with antibacterial soap, rinse and pat dry prior to dressing wounds Cleanser: Wound Cleanser 1 x Per Day/30 Days Discharge Instructions: Wash your hands with soap and water. Remove old dressing, discard into plastic bag and place into trash. Cleanse the wound with Wound Cleanser prior to applying a clean dressing using gauze sponges, not tissues or cotton balls. Do not scrub or use excessive force. Pat dry using gauze sponges, not tissue or cotton balls. Prim Dressing: Silvercel Small 2x2 (in/in) 1 x Per Day/30 Days ary Discharge Instructions: Apply Silvercel Small 2x2 (in/in) as instructed Secondary Dressing: ABD Pad 5x9 (in/in) 1 x Per Day/30 Days Discharge Instructions: Cover with ABD pad Secured With: Kerlix Roll Sterile or Non-Sterile 6-ply 4.5x4 (yd/yd) 1 x Per Day/30 Days Discharge Instructions: Apply Kerlix as directed Electronic Signature(s) Signed: 02/26/2023 4:48:59 PM By: [name] PA-C Signed: 02/27/2023 1:16:50 PM By: Midge Aver MSN RN CNS WTA Entered By: Midge Aver on 02/26/2023 12:15:05 Problem List Details -------------------------------------------------------------------------------- [name] ([id number]) [id number] Page 9 of 15 Patient Name: Date of Service: Chad [name], Chad [name] 02/26/2023 10:15 A M Medical Record Number: [id number] Patient Account Number: [id number] Date of Birth/Sex: Treating RN: 16-Jul-1961 (62 y.o. 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 02/19/2023 No Yes foot 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.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 Chad [name] Primary Care Provider: [name] Other Clinician: Referring Provider: Treating Provider/Extender: [name] Weeks in Treatment: 64 Subjective Chief Complaint Information obtained from Patient ELVEN, LABOY ([id number]) [id number] Page 10 of 15 Left ankle ulcer, Left dorsal foot wound, right heel History of Present Illness (HPI) 10/08/18 on evaluation today patient actually presents to our office for initial evaluation concerning wounds that he has of the bilateral lower extremities. 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 He also has bilateral venous stasis. He  apparently has been homeless currently following discharge from the hospital apparently he has been placed at [facility] which is is a skilled nursing facility locally. Nonetheless fortunately he does not show any signs of infection at this time which is good news. In fact several of the wound actually appears to be showing some signs of improvement already in my pinion. There are a couple areas in the left leg in particular there likely gonna require some sharp debridement to help clear away some necrotic tissue and help with more sufficient healing. No fevers, chills, nausea, or vomiting noted at this time. 10/15/18 on evaluation today patient actually appears to be doing very well in regard to his bilateral lower extremities. He's been tolerating the dressing changes without complication. Fortunately there does not appear to be any evidence of active infection at this time which is great news. Overall I'm actually very pleased with how this has progressed in just one visits time. Readmission: 08/14/2020 upon evaluation today patient presents for re-evaluation here in our clinic. He is having issues with his left ankle region as well as his right toe and his right heel. He tells me that the toe and heel actually began as a area that was itching that he was scratching and then subsequently opened up into wounds. These may have been abscess areas I presume based on what I am seeing currently. With regard to his left ankle region he tells me this was a similar type occurrence although he does have venous stasis this very well may be more of a venous leg ulcer more than anything. Nonetheless I do believe that the patient would benefit from appropriate and aggressive wound care to try to help get things under better control here. He does have history of a stroke on the left side  affecting him to some degree there that he is able to stand although he does have some residual weakness. Otherwise again the patient does have chronic venous insufficiency as previously noted. His arterial studies most recently obtained showed that he had an ABI on the right of 1.16 with a TBI of 0.52 and on the left and ABI of 1.14 with a TBI of 0.81. That was obtained on 06/19/2020. 08/28/2020 upon evaluation today patient appears to be doing decently well in regard to his wounds in general. He has been tolerating the dressing changes without complication. Fortunately there does not appear to be any signs of active infection which is great news. With that being said I think the [facility] is doing a good job I would recommend that we likely continue with that currently. 09/11/2020 upon evaluation today patient's wounds did not appear to be doing too poorly but again he is not really showing signs of significant improvement with regard to any of the wounds on the right. None of them have Hydrofera Blue on them I am not exactly sure why this is not being followed as the facility did not contact us to let us know of any issues with obtaining dressings or otherwise. With that being said he is supposed to be using Hydrofera Blue on both of the wounds on the right foot as well as the ankle wound on the left side. 09/18/2020 upon evaluation today patient appears to be doing poorly with regard to his wounds. Again right now the left ankle in particular showed signs of extreme maceration. Apparently he was told by someone with staff at [facility] they could not get the [facility]. With  that being said this is something that is never been relayed to [place] one way or another. Also the patient subsequently has not supposed to have a border gauze dressing on. He should have an ABD pad and roll gauze to secure as this drains much too much just to have a border gauze dressing to cover. Nonetheless the  fact that they are not using the appropriate dressing is directly causing deterioration of the left ankle wound it is significantly worse today compared to what it was previous. I did attempt to call [facility] while the patient was here I called three times and got no one to even pick up the phone. After this I had my for an office coordinator call and she was able to finally get through and leave a message with the D ON as of dictation of this note which is roughly about an hour and a half later I still have not been able to speak with anyone at the facility. 09/25/2020 upon evaluation today patient actually showing signs of good improvement which is excellent news. He has been tolerating the dressing changes without complication. Fortunately there is no signs of active infection which is great news. No fevers, chills, nausea, vomiting, or diarrhea. I do feel like the facility has been doing a much better job at taking care of him as far as the dressings are concerned. However the director of nursing never did call me back. 10/09/2020 upon evaluation today patient appears to be doing well with regard to his wound. The toe ulcer did require some debridement but the other 2 areas actually appear to be doing quite well. 10/19/2020 upon evaluation today patient actually appears to be doing very well in regard to his wounds. In fact the heel does appear to be completely healed. The toe is doing better in the medial ankle on the left is also doing better. Overall I think he is headed in the right direction. 10/26/2020 upon evaluation today patient appears to be doing well with regard to his wound. He is showing signs of improvement which is great news and overall I am very pleased with where things stand today. No fevers, chills, nausea, vomiting, or diarrhea. 11/02/2020 upon evaluation today patient appears to be doing well with regard to his wounds. He has been tolerating the dressing changes without  complication overall I am extremely pleased with where things stand today. He in regard to the toe is almost completely healed and the medial ankle on the left is doing much better. 11/09/2020 upon evaluation today patient appears to be doing a little poorly in regard to his left medial ankle ulcer. Fortunately there does not appear to be any signs of systemic infection but unfortunately locally he does appear to be infected in fact he has blue-green drainage consistent with Pseudomonas. 11/16/2020 upon evaluation today patient appears to be doing well with regard to his wound. It actually appears to be doing better. I did place him on gentamicin cream since the Cipro was actually resistant even though he was positive for Pseudomonas on culture. Overall I think that he does seem to be doing better though I am unsure whether or not they have actually been putting the cream on. The patient is not sure that we did talk to the nurse directly and she was going to initiate that treatment. Fortunately there does not appear to be any signs of active infection at this time. No fevers, chills, nausea, vomiting, or diarrhea. 4/28; the area  on the right second toe is close to healed. Left medial ankle required debridement 12/07/2020 upon evaluation today patient appears to be doing well with regard to his wounds. In fact the right second toe appears to be completely healed which is great news. Fortunately there does not appear to be any signs of active infection at this time which is also great news. I think we can probably discontinue the gentamicin on top of everything else. 12/14/2020 upon evaluation today patient appears to be doing well with regard to his wound. He is making good progress and overall very pleased with where things stand today. There is no signs of active infection at this time which is great news. 12/28/2020 upon evaluation today patient appears to be doing well with regard to his wounds. He has  been tolerating the dressing changes without complication. Fortunately there is no signs of active infection at this time. No fevers, chills, nausea, vomiting, or diarrhea. 12/28/2020 upon evaluation today patient's wound bed actually showed signs of excellent improvement. He has great epithelization and granulation I do not see any signs of infection overall I am extremely pleased with where things stand at this point. No fevers, chills, nausea, vomiting, or diarrhea. 01/11/2021 upon evaluation today patient appears to be doing well with regard to his wound on his leg. He has been tolerating the dressing changes without complication. Fortunately there does not appear to be any signs of active infection which is great news. No fevers, chills, nausea, vomiting, or diarrhea. 01/25/2021 upon evaluation today patient appears to be doing well with regard to his wound. He has been tolerating the dressing changes without complication. Fortunately the collagen seems to be doing a great job which is excellent news. No fevers, chills, nausea, vomiting, or diarrhea. 02/08/2021 upon evaluation today patient's wound is actually looking a little bit worse especially in the periwound compared to previous. Fortunately there does Chad [name], Chad [name] ([id number]) [id number] Page 11 of 15 not appear to be any signs of infection which is great news with that being said he does have some irritation around the periphery of the wound which has me more concerned. He actually had a dressing on that had not been changed in 3 days. He also is supposed to have daily dressing changes. With regard to the dressing applied he had a silver alginate dressing and silver collagen is what is recommended and ordered. He also had no Desitin around the edges of the wound in the periwound region although that is on the order inspect to be done as well. In general I was very concerned I did contact [facility] actually spoke with Verlon Au who is the scheduling individual and subsequently she stated that she would pass the information to the D ON apparently the D ON was not available to talk to me when I call today. 02/18/2021 upon evaluation today patient's wound is actually showing signs of improvement. Fortunately there does not appear to be any evidence of infection which is great news overall I am extremely pleased with where things stand today. No fevers, chills, nausea, vomiting, or diarrhea. 8/3; patient presents for 1 week follow-up. He has no issues or complaints today. He denies signs of infection. 03/11/2021 upon evaluation today patient appears to be doing well with regard to his wound. He does have a little bit of slough noted on the surface of the wound but fortunately there does not appear to be any signs of active infection at this time. No fevers, chills,  nausea, vomiting, or diarrhea. 03/18/2021 upon evaluation today patient appears to be doing well with regard to his wound. He has been tolerating the dressing changes without complication. There was a little irritation more proximal to where the wound was that was not noted last week but nonetheless this is very superficial just seems to be more irritation we just need to make sure to put a good amount of the zinc over the area in my opinion. Otherwise he does not seem to be doing significantly worse at all which is great news. 03/25/2021 upon evaluation today patient appears to be doing well with regard to his wound. He is going require some sharp debridement today to clear with some of the necrotic debris. I did perform this today without complication postdebridement wound bed appears to be doing much better this is great news. 04/08/2021 upon evaluation today patient appears to be doing decently well in regard to his wound although the overall measurement is not significantly smaller compared to previous. It is gone down a little  bit but still the facility continues to not really put the appropriate dressings in place in fact he was supposed to have collagen we think he probably had more of an allergy to At this point. Fortunately there does not appear to be any signs of active infection systemically though locally I do not see anything on initial visualization either as far as erythema or warmth. 04/15/2021 upon evaluation today patient appears to be doing well with regard to his wound. He is actually showing signs of improvement. I did place him on antibiotics last week, Cipro. He has been taking that 2 times a day and seems to be tolerating it very well. I do not see any evidence of worsening and in fact the overall appearance of the wound is smaller today which is also great news. 9/26; left medial ankle chronic venous insufficiency wound is improved. Using Hydrofera Blue 10/10; left medial ankle chronic venous insufficiency. Wound has not changed much in appearance completely nonviable surface. Apparently there have been problems getting the right product on the wound at the facility although he came in with [facility] on today 05/14/2021 upon evaluation today patient appears to be doing well with regard to his wound. I think he is making progress here which is good news. Fortunately there does not appear to be any signs of active infection at this time. No fevers, chills, nausea, vomiting, or diarrhea. 05/20/2021 upon evaluation today patient appears to be doing well with regard to his wound. He is showing signs of good improvement which is great news. There does not appear to be any evidence of active infection which is also excellent news. No fevers, chills, nausea, vomiting, or diarrhea. 05/28/2021 upon evaluation today patient appears to be doing quite well. There does not appear to be any signs of active infection at this time which is great news. Overall I am extremely pleased with where things stand today. I  think he is headed in the right direction. 06/11/2021 upon evaluation today patient appears to be doing well with regard to his left ankle ulcer and poorly in regard to the toe ulcer on the second toe right foot. This appears to show signs of joint exposure. Apparently this has been present for 1 to 2 months although he kept forgetting to tell me about it. That is unfortunate as right now it definitely appears to be doing significantly worse than what I would like to see. There does not  appear to be any signs of active infection systemically though locally I am concerned about the possibility of infection the toe is quite red. Again no one from the facility ever contacted [place] to advise that this was going on in the interim either. 06/17/2021 upon evaluation today patient presents for follow-up I did review his x-ray which showed a navicular bone fracture I am unsure of the chronicity of this. Subsequently he also had osteomyelitis of the toe which was what I was more concerned about this did not show up on x-ray but did show up on the pathology scrapings. This was listed as acute osteomyelitis. Nonetheless at this point I think that the antibiotic treatment is the best regimen to go with currently. The patient is in agreement with that plan. Nonetheless he has initially 30 days of doxycycline off likely extend that towards the end of the treatment cycle that will be around the middle of December for an additional 2 weeks. That all depends on how well he continues to heal. Nonetheless based on what I am seeing in the foot I did want a proceed with an MRI as well which I think will be helpful to identify if there is anything else that needs to be addressed from the standpoint of infection. 06/24/2021 upon evaluation today patient appears to be doing pretty well in regards to his wounds. I think both are actually showing signs of improvement which is good I did review his MRI today which did show signs of  osteomyelitis of the middle and proximal phalanx on his right foot of the affected toe. With that being said this is actually showing signs of significant improvement today already with the antibiotic therapy I think the redness is also improved. Overall I think that we just need to give this some time with appropriate wound care we will see how things go potentially hyperbarics could be considered. 07/02/2021 upon inspection today patient actually appears to be doing well in regard to his left ankle which is getting very close to complete resolution of pleased in that regard. Unfortunately he is continuing to have issues with his second toe right foot and this seems to still be very painful for him. Recommend he try something different from the standpoint of antibiotics. 07/15/2021 upon evaluation today patient appears to be doing actually pretty well in regard to his foot. This is actually showing signs of significant improvement which is great news. Overall I feel like the patient is improving both in regard to the second toe as well as the ankle on the left. With that being said the biggest issue that I do see currently is that he is needing to have a refill of the doxycycline that we previously treated him with. He also did see podiatry they are not going to recommend any amputation at this point since he seems to be doing quite well. For that reason we just need to keep things under control from an infection standpoint. 08/01/2021 upon evaluation today patient appears to be doing well with regard to his wound. He has been tolerating the dressing changes without complication. Fortunately there does not appear to be any evidence of active infection locally nor systemically at this point. In fact I think everything is doing excellent in fact his second toe on the right foot is almost healed and the ankle on the left ankle region is actually very close to being healed as well. 08/08/2021 upon evaluation  today patient appears to be doing well  with regard to his wound. He has been tolerating the dressing changes without complication. Fortunately I do not see any signs of active infection at this time. Readmission: 12-06-2021 upon evaluation today patient presents for reevaluation here in the clinic he does tell me that he was being seen in facility at [facility] by a provider that was coming in. He is not sure who this was. He tells me however that the wound seems to have gotten worse even compared to where it was when we last saw him at this point. With that being said I do believe that he is likely going need ongoing wound care here in the clinic and I do believe that we need to be the ones to frontline this since his wound does seem to be getting worse not better at this point. He voiced understanding. He is also in agreement with this plan and feels more comfortable coming here she tells me. Patient's medical history really has not changed since his prior admission he was only gone since January. 12-27-2021 upon evaluation today patient appears to be doing well with regard to his wound they did run out of the [facility] so they did not put anything on [place], [name] ([id number]) [id number] Page 12 of 15 just an ABD pad with gentamicin. Still we are seeing some signs of good improvement here with some new epithelization which is great news. 01-10-2022 upon evaluation today patient appears to be doing well with regard to his wounds and he is going require some sharp debridement but overall seems to be making good progress. Fortunately I do not see any evidence of active infection locally or systemically at this time which is great news. 01-24-2022 upon evaluation today patient appears to be doing well with regard to his wound. The facility actually came and dropped him off early and he had another appointment at the hospital and then they just brought him over  here and this was still hours before his appointment this afternoon. For that reason we did do our best to work him in this morning and fortunately had some space to make this happen. With that being said patient's wound does seem to be making progress here and I am very pleased in that regard I do not see any signs of active infection locally or systemically at this time. 02-07-2022 patient appears to be doing well currently in regard to his wounds. In fact one of them the more proximal is healed the distal is still open but seems to be doing excellent. Fortunately I do not see any evidence of active infection locally or systemically at this time which is great news. No fevers, chills, nausea, vomiting, or diarrhea. 7/27; left medial ankle venous. Improving per our intake nurse. We are using Hydrofera Blue under Tubigrip compression. They are changing that at his facility 03-06-2022 upon evaluation today patient appears to be doing well with regard to the his wound he is can require some sharp debridement but seems to be making excellent progress. Fortunately I do not see any evidence of active infection locally or systemically which is great news. 03-27-2022 upon evaluation today patient's wound is actually showing signs of excellent improvement. Fortunately I see no signs of active infection locally or systemically at this time which is great news. No fevers, chills, nausea, vomiting, or diarrhea. 04-21-2022 upon evaluation today patient appears to be doing excellent in regard to his wound in fact this is very close to resolution based on what I  am seeing. I do not see any evidence of active infection locally or systemically at this time which is great news and overall I am extremely pleased with where we are today. 05-05-2022 upon evaluation today patient's wound actually appears to potentially be completely healed. Fortunately I do not see any evidence of active infection at this time which is great  news and overall I am very pleased I think this needs a little time to toughen up but other than that I really do believe were doing quite well. He is very pleased to hear this its been a long time coming. 05-19-2022 upon evaluation today patient appears to be doing well currently in regard to his wound. He in fact we were hoping will be completely healed and closed but it still has a very tiny area right in the center which is still continuing to drain. Fortunately I do not see any signs of infection locally or systemically which is great news. 11/6; this wound is close to completely" he comes in this week with some erythema at 1 edge of this which looks like something was rubbing on the area. Other than that no evidence of infection 06-16-2022 upon evaluation today patient appears to be doing well currently in regard to his ankle ulcer. This is very close to being healed but still has a small area in the central portion which is not. Fortunately there does not appear to be any signs of infection locally or systemically which is great news. No fevers, chills, nausea, vomiting, or diarrhea. 06-30-2022 upon evaluation patient's wound pretty much appears to be almost completely closed. In fact it may even be closed but there are still a lot of skin irritation around the edges of the wound and I just do not feel great about releasing them yet I would like to continue to monitor this just a little bit longer before getting to that point. He is not opposed to this and in fact is happy to continue to come in if need be in order to make sure that things are moving in the right direction he definitely does not want to backtrack. 07-14-2022 upon evaluation today patient appears to be doing well currently in regard to his wound. Has been tolerating the dressing changes without complication. Fortunately I see no evidence of active infection locally nor systemically which is great news. No fevers, chills, nausea,  vomiting, or diarrhea. 08-11-2022 upon evaluation today patient appears to be doing a little worse than last time I saw him although it has been a month. Unfortunately he was not able to be seen due to the fact that he was quarantined in the facility with COVID. Subsequently he tells me that he mention to the facility staff several times about taking it off over the past several weeks but they continue to tell him that someone have to get in touch with [place] here although nobody ever did until Friday when the nurse manager called to have that documented in the communication notes. With that being said unfortunately this means that he had a wrap on that he was supposed to have on for only 1 week for ended up being on four 1 month essentially. I am glad that there is nothing worse than what we see currently to be perfectly honest. 08-19-2022 upon evaluation today patient appears to be doing well currently in regard to his wounds. He has been tolerating the dressing changes without complication. This is actually smaller today which is great news  after last week's reopening. Fortunately I do not see any evidence of infection locally nor systemically at this point. 08-26-2022 upon evaluation today patient appears to be doing well currently in regard to his wound. He has been tolerating the dressing changes without complication. Fortunately there does not appear to be any signs of active infection locally nor systemically which is great news. No fevers, chills, nausea, vomiting, or diarrhea. 09-02-2022 upon evaluation today patient's wound actually showing signs of excellent improvement this is measuring smaller and looking much better. I am very pleased with where we stand I do believe that we are headed in the right direction. 09-09-2022 upon evaluation today patient appears to be doing decently well in regard to his leg in general although there was some swelling this is the 1 thing that I am not too happy with  based on what I see currently. Fortunately there does not appear to be any signs of infection locally nor systemically at this point. 09-16-2022 upon evaluation today patient appears to be doing well currently in regard to his wound. He is actually showing signs of improvement he wore the Tubigrip this week and it significantly better compared to last week's evaluation. Fortunately I do not see any evidence of active infection locally or systemically which is great news. Overall I think that were headed in the right direction which is great news. In fact overall I think this is the best that he has looked in some time. He does not like the Tubigrip but actually did extremely well with this in my opinion. I think he needs to continue to utilize this. 09-23-2022 upon evaluation today patient appears to be doing well currently in regard to his wound. He has been tolerating the dressing changes without complication. Fortunately there does not appear to be any signs of active infection locally nor systemically which is great news and in general I do feel like that we are headed in the right direction. He has been having trouble with the Tubigrip however we have gotten the facility to order compression socks he has not gotten them as of yet. 3/4; patient is about the same with a wound on the left posterior leg. This is a venous wound. He needs more compression on this leg but he took off the previous wraps we are using. I am not certain whether he is using juxta lite stockings at the nursing home. 3/12; patient presents for follow-up. He unfortunately has 2 new wounds 1 to the dorsal left side and the other to the right heel. He is not sure how these happened. The right heel appears to be caused by pressure. He has been using Hydrofera Blue to the original wound on the left leg. 10-14-2022 upon evaluation today patient appears to be doing well currently in regard to his original wound but unfortunately he has  several new wounds which I was completely unaware of before walking into the room to see him today. Unfortunately he seems to not be doing nearly as well as what I saw when I last had an appointment with him 3 weeks ago. Fortunately I do not see any signs of systemic infection though unfortunately locally he has definite infection of the [facility], [name] ([id number]) [id number] Page 13 of 15 left lower extremity. He is currently on doxycycline I am going to likely have to extend that today. 10-21-2022 upon evaluation today patient appears to be doing well currently in regard to his wounds. He seems to be tolerating the  dressing changes without complication. Fortunately there does not appear to be any signs of active infection locally or systemically which is great news and in general I definitely feel like we are on the right track here. 10-30-2022 upon evaluation today patient still continues to not be doing too well at all with regard to his wounds. Unfortunately he is still having signs of infection as well which also has been concerned. Fortunately I do not see any signs of active infection locally nor systemically at this time. No fevers, chills, nausea, vomiting, or diarrhea. 11-06-2022 upon evaluation today patient appears to be doing okay currently in regard to his wounds. All things considered with his blood flow not being nearly as good as what should I think that he is making pretty good progress here. Fortunately I do not see any signs of infection locally nor systemically which is great news. No fevers, chills, nausea, vomiting, or diarrhea. 11-13-2022 upon evaluation today patient appears to be doing poorly currently in regard to his wounds. Things seem to be getting a little bit worse not better. 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 Subsequently he is also going to  need to have vascular intervention but we are unsure exactly when this is going on the undertaking. I would try to actually send a message to Dr. Hazle Quant and the vascular team just to see what exactly the plan is going forward. The patient is in agreement with that plan I just think we need to have a plan especially in light of the fact the left leg seems to be getting a little bit worse at this point. 11-25-2022 upon evaluation today patient is currently now discharged from the hospital following his colostomy procedure. That did very well and he seems to be doing well. Fortunately there does not appear to be any signs of active infection locally or systemically which is great news. No fevers, chills, nausea, vomiting, or diarrhea. 12-04-2022 upon evaluation today patient appears to be doing actually decently well in regard to his wounds and actually very pleased with where we stand and I think that he is making some pretty good progress here. Fortunately I do not see any signs of active infection locally nor systemically which is great news. No fevers, chills, nausea, vomiting, or diarrhea. 12-12-2022 upon evaluation today patient appears to be doing well currently in regard to his wound. He has been tolerating the dressing changes without complication. The wounds all seem to be making some progress to a degree. He has appointment with vascular on the 23rd of this month. 5/24; the patient was scheduled for a left lower extremity angiogram yesterday but apparently they were overbooked and they wanted to admit him to hospital last night so they could do this early this morning but he refused I see that Dr. Wyn Quaker has rescheduled him for June 6. He is using silver alginate to wounds on his bilateral lower extremities. 01-02-2023 upon evaluation today patient appears to be doing poorly in regard to his wounds still. Subsequently he did have his angiogram but apparently there really was not much that was  found that could be revascularized. The patient tells me that Dr. Wyn Quaker was going to contact me here in the office I have not heard from him as of yet and I may see about giving him a call just to follow-up or possibly send a message  through epic which could be an option as well. 01-09-2023 upon evaluation today patient appears to be doing poorly in regard to his legs he is extremely swollen. He has been sleeping in his chair he tells me for the past week. His bed is broken and they have ordered a new one which is supposedly supposed to be there today. With that being said he the way he needs to be not sleeping in his chair that is his wheelchair every day and through the night. This is causing his legs to be much more swollen and it seen and how badly wounds are doing at this point. This has made things significantly worse. Fortunately I do not see any signs of infection right now unfortunately I do believe that the patient has a tremendous amount of drainage currently. 01-19-2023 upon evaluation today patient appears to be doing poorly in regard to his wounds of his legs his swelling is better but still not as good as what we would like to see. Fortunately I do not see any evidence of active infection locally nor systemically which is great news and in general I do believe that we are moving in the right direction here. Nonetheless it would be better if she was in the bed sleeping or not send up in his wheelchair although he seems to still be set up in the wheelchair at this time. He tells me that he is just not as comfortable in the bed as the wheelchair but unfortunately this is causing damage to his legs which I discussed with him today as well. Nonetheless I am not sure that he is going to be amendable to doing anything different. 01-27-2023 upon evaluation today patient appears to be doing well currently. Fortunately I do not see any evidence of active infection locally nor systemically which is great  news I think he is actually doing the best option and while in fact I would try to see if we can do some debridement today to get things moving along. 02-03-2023 upon evaluation today patient's wounds unfortunately are not doing nearly as well. Will be doing the [employer] wraps unfortunately we just not seeing the improvement that I would like to see. Fortunately I do not see any evidence of infection at this time which is good news unfortunately I do think that he needs to be changed on a daily basis due to the amount of drainage otherwise I do not think this is really going to continue to improve dramatically. 02-12-2023 upon evaluation today patient appears to be doing better in a lot of ways in regard to his wounds and although his toes on the left side actually seem a bit worse. Fortunately I do not see any evidence of active infection locally nor systemically at this time which is good news. With that being said he is having some bleeding from the left leg I think that this is an area that looks like it probably got bumped. With that being said I discussed this with the patient he tells me he does not know of any trauma that occurred but at the same time if was not this that it was skin that got pulled off. Dressing change there is only one of the 2 that could have happened. 7/25; this is a patient who is very frail at [facility] nursing facility. He is nonambulatory. He has wounds on his bilateral lower legs. However much worse on the left we have been using silver cell to  all of his wounds He comes in today and a lot more pain our intake nurse reports deterioration of the left foot. I ended up looking through some of his record. He had an angiogram in June with essentially the same findings bilaterally that is fairly normal femoral artery profunda femoris artery superficial femoral and popliteal arteries without significant disease however he only had 1 vessel outflow to his feet which  was the peroneal arteries bilaterally anterior and posterior tibial area arteries were occluded without distal reconstitution. 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 I am really not certain of the stage of this currently or who is following 02-26-2023 upon evaluation today patient unfortunately appears to be doing significantly worse compared to where he was previous. The left foot is showing signs of dramatic worsening in regard to the overall appearance his toes are starting to become necrotic almost in full and he has much more cyanosis present than what I even seen previous. He is already seeing Dr. Wyn Quaker and unfortunately there was not anything that they saw that was an immediate cause for this but at the same time I feel like this may have changed and even worsened I feel like this could be a vascular event and I believe this is a limb-threatening situation. Objective Chad [name], Chad [name] ([id number]) [id number] Page 14 of 15 Constitutional Well-nourished and well-hydrated in no acute distress. Vitals Time Taken: 10:45 AM, Height: 69 in, Weight: 150 lbs, BMI: 22.1, Temperature: 97.8 F, Pulse: 105 bpm, Respiratory Rate: 18 breaths/min, Blood Pressure: 96/64 mmHg. Respiratory normal breathing without difficulty. Psychiatric this patient is able to make decisions and demonstrates good insight into disease process. Alert and Oriented x 3. pleasant and cooperative. General Notes: Upon inspection patient's wounds again appear to be doing worse really on both lower extremities I do not like the coloring in the mottling that I am seeing but at the same time the left is definitely significantly worse than the right. I do believe the patient needs to go to the ER for further evaluation as soon as possible. Integumentary (Hair, Skin) Wound #12 status is Open. Original cause  of wound was Gradually Appeared. The date acquired was: 07/12/2019. The wound has been in treatment 63 weeks. The wound is located on the Left,Distal,Medial Ankle. The wound measures 2.7cm length x 1.3cm width x 0.2cm depth; 2.757cm^2 area and 0.551cm^3 volume. There is Fat Layer (Subcutaneous Tissue) exposed. There is a medium amount of serosanguineous drainage noted. There is large (67-100%) pink granulation within the wound bed. There is a small (1-33%) amount of necrotic tissue within the wound bed including Adherent Slough. Wound #15 status is Open. Original cause of wound was Gradually Appeared. The date acquired was: 10/01/2022. The wound has been in treatment 20 weeks. The wound is located on the Left,Dorsal Foot. The wound measures 10.5cm length x 7cm width x 0.4cm depth; 57.727cm^2 area and 23.091cm^3 volume. There is Fat Layer (Subcutaneous Tissue) exposed. There is a medium amount of serosanguineous drainage noted. There is small (1-33%) pink granulation within the wound bed. There is a large (67-100%) amount of necrotic tissue within the wound bed including Eschar and Adherent Slough. Wound #16 status is Open. Original cause of wound was Gradually Appeared. The date acquired was: 09/26/2022. The wound has been in treatment 20 weeks. The wound is located on the Right Calcaneus. The wound measures 6cm length x  2cm width x 0.2cm depth; 9.425cm^2 area and 1.885cm^3 volume. There is Fat Layer (Subcutaneous Tissue) exposed. There is a medium amount of serosanguineous drainage noted. There is small (1-33%) red, pink granulation within the wound bed. There is a large (67-100%) amount of necrotic tissue within the wound bed including Eschar and Adherent Slough. Wound #17 status is Open. Original cause of wound was Pressure Injury. The date acquired was: 09/30/2022. The wound has been in treatment 19 weeks. The wound is located on the Left,Anterior Lower Leg. The wound measures 5cm length x 1.7cm width x  0.2cm depth; 6.676cm^2 area and 1.335cm^3 volume. There is a medium amount of serosanguineous drainage noted. There is large (67-100%) red, pink granulation within the wound bed. There is a small (1-33%) amount of necrotic tissue within the wound bed including Adherent Slough. Wound #18 status is Open. Original cause of wound was Pressure Injury. The date acquired was: 09/26/2022. The wound has been in treatment 19 weeks. The wound is located on the Right,Medial T Second. The wound measures 2cm length x 1cm width x 0.1cm depth; 1.571cm^2 area and 0.157cm^3 volume. There oe is Fat Layer (Subcutaneous Tissue) exposed. There is a medium amount of serosanguineous drainage noted. There is small (1-33%) red, pink granulation within the wound bed. There is a medium (34-66%) amount of necrotic tissue within the wound bed including Adherent Slough. Wound #19 status is Open. Original cause of wound was Gradually Appeared. The date acquired was: 11/10/2022. The wound has been in treatment 15 weeks. The wound is located on the Left,Posterior Lower Leg. The wound measures 2cm length x 2cm width x 0.2cm depth; 3.142cm^2 area and 0.628cm^3 volume. There is Fat Layer (Subcutaneous Tissue) exposed. There is a medium amount of serous drainage noted. There is small (1-33%) red, pink granulation within the wound bed. There is a large (67-100%) amount of necrotic tissue within the wound bed including Adherent Slough. Wound #20 status is Open. Original cause of wound was Pressure Injury. The date acquired was: 02/20/2023. The wound is located on the Left,Proximal,Medial Foot. The wound measures 1.5cm length x 1.5cm width x 0.1cm depth; 1.767cm^2 area and 0.177cm^3 volume. There is Fat Layer (Subcutaneous Tissue) exposed. There is no tunneling or undermining noted. There is a medium amount of serosanguineous drainage noted. There is no granulation within the wound bed. There is a large (67-100%) amount of necrotic tissue within  the wound bed including Eschar. 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 Based on what I am seeing I will go ahead and make a recommendation for the patient to go to the ER. Of note we are attempting to contact the DON as the patient states that he has money in his bed that he does not want to have lost. He states that it is around $280. Nonetheless he is in an envelope under the she that he has hidden. Will try to get in touch with the DON so that she can arrange to have this found and locked up so that he will not lose this. I really feel like he is to go to the ER now. 2. With  regard to the patient's wounds I think to just put an ABD pads over these to get him to the hospital to where to go. I Feel he may require IV antibiotic therapy. With that being said I also feel like that there is a potential here for vascular evaluation I feel like this is a limb threatening situation. 3. The patient is in agreement with going straight to the ER for evaluation today and working to go and call EMS for transport. Will see him back depending on the evaluation there in the ER. We will see patient back for reevaluation in 1 week here in the clinic. If anything worsens or changes patient will contact our office for additional Chad [name], Chad [name] ([id number]) [id number] Page 15 of 15 recommendations. Electronic Signature(s) Signed: 02/26/2023 11:19:55 AM By: [name] PA-C Entered By: [name] on 02/26/2023  11:19:55 -------------------------------------------------------------------------------- SuperBill Details Patient Name: Date of Service: Chad [name], Chad [name] 02/26/2023 Medical Record Number: [id number] Patient Account Number: [id number] Date of Birth/Sex: Treating RN: 1961-01-08 (62 y.o. Chad [name] Primary Care Provider: [name] Other Clinician: Referring Provider: Treating Provider/Extender: [name] Weeks in Treatment: 64 Diagnosis Coding ICD-10 Codes Code Description [phone] Chronic venous hypertension (idiopathic) with ulcer and inflammation of left lower extremity 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 foot 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 side 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, stage 3 Facility Procedures : CPT4 Code: [id number] Description: 84166 - WOUND CARE VISIT-LEV 5 EST PT Modifier: Quantity: 1 Physician Procedures : CPT4 Code Description Modifier [id number] - WC PHYS LEVEL 5 - EST PT ICD-10 Diagnosis Description 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 (idiopathic) with ulcer and inflammation of left lower extremity L97.322 Non-pressure chronic ulcer of left ankle  with fat layer exposed L98.492 Non-pressure chronic ulcer of skin of other sites with fat layer exposed 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 foot Quantity: 1 Electronic Signature(s) Signed: 02/26/2023 12:17:01 PM By: Midge Aver MSN RN CNS WTA Signed: 02/26/2023 4:48:59 PM By: [name] PA-C Previous Signature: 02/26/2023 11:22:24 AM Version By: [name] PA-C Previous Signature: 02/26/2023 11:22:08 AM Version By: [name] PA-C Entered By: Midge Aver on 02/26/2023 12:16:59

## 2023-02-26 NOTE — Assessment & Plan Note (Signed)
Managed by palliative care Continue home oxycodone

## 2023-02-27 ENCOUNTER — Ambulatory Visit: Payer: Medicaid Other

## 2023-02-27 ENCOUNTER — Ambulatory Visit: Admission: RE | Admit: 2023-02-27 | Payer: Medicaid Other | Source: Ambulatory Visit

## 2023-02-27 DIAGNOSIS — S81802A Unspecified open wound, left lower leg, initial encounter: Secondary | ICD-10-CM | POA: Diagnosis not present

## 2023-02-27 DIAGNOSIS — I96 Gangrene, not elsewhere classified: Secondary | ICD-10-CM

## 2023-02-27 LAB — HEPARIN LEVEL (UNFRACTIONATED)
Heparin Unfractionated: 0.38 IU/mL (ref 0.30–0.70)
Heparin Unfractionated: 0.37 IU/mL (ref 0.30–0.70)

## 2023-02-27 MED ORDER — HEPARIN BOLUS VIA INFUSION
1900.0000 [IU] | Freq: Once | INTRAVENOUS | Status: AC
  Administered 2023-02-27: 1900 [IU] via INTRAVENOUS
  Filled 2023-02-27: qty 1900

## 2023-02-27 MED ORDER — OXYCODONE HCL 5 MG PO TABS
10.0000 mg | ORAL_TABLET | Freq: Four times a day (QID) | ORAL | Status: DC | PRN
  Administered 2023-02-27 – 2023-03-05 (×12): 10 mg via ORAL
  Filled 2023-02-27 (×13): qty 2

## 2023-02-27 MED ORDER — PROSOURCE PLUS PO LIQD
30.0000 mL | Freq: Two times a day (BID) | ORAL | Status: DC
  Administered 2023-02-28 – 2023-03-04 (×3): 30 mL via ORAL
  Filled 2023-02-27 (×6): qty 30

## 2023-02-27 MED ORDER — ENSURE ENLIVE PO LIQD
237.0000 mL | Freq: Two times a day (BID) | ORAL | Status: DC
  Administered 2023-02-27 – 2023-03-04 (×4): 237 mL via ORAL

## 2023-02-27 MED ORDER — POTASSIUM CHLORIDE CRYS ER 20 MEQ PO TBCR
20.0000 meq | EXTENDED_RELEASE_TABLET | ORAL | Status: AC
  Administered 2023-02-27 (×3): 20 meq via ORAL
  Filled 2023-02-27 (×3): qty 1

## 2023-02-27 MED ORDER — MEDIHONEY WOUND/BURN DRESSING EX PSTE
1.0000 | PASTE | Freq: Every day | CUTANEOUS | Status: DC
  Administered 2023-02-27 – 2023-03-05 (×5): 1 via TOPICAL
  Filled 2023-02-27: qty 44

## 2023-02-27 MED ORDER — VITAMIN C 500 MG PO TABS
500.0000 mg | ORAL_TABLET | Freq: Two times a day (BID) | ORAL | Status: DC
  Administered 2023-02-27 – 2023-03-05 (×8): 500 mg via ORAL
  Filled 2023-02-27 (×12): qty 1

## 2023-02-27 MED ORDER — CHLORHEXIDINE GLUCONATE CLOTH 2 % EX PADS
6.0000 | MEDICATED_PAD | Freq: Every day | CUTANEOUS | Status: DC
  Administered 2023-02-27 – 2023-03-05 (×6): 6 via TOPICAL

## 2023-02-27 MED ORDER — ADULT MULTIVITAMIN W/MINERALS CH
1.0000 | ORAL_TABLET | Freq: Every day | ORAL | Status: DC
  Administered 2023-02-28 – 2023-03-05 (×4): 1 via ORAL
  Filled 2023-02-27 (×7): qty 1

## 2023-02-27 NOTE — Progress Notes (Signed)
DEQUAVIUS, KUHNER ([id number]) [id number] Page 1 of 19 Visit Report for 02/26/2023 Arrival Information Details Patient Name: Date of Service: Chad [name], Chad [name] 02/26/2023 10:15 A M Medical Record Number: [id number] Patient Account Number: [id number] Date of Birth/Sex: Treating RN: [birth year] (62 y.o. [name] Primary Care : [name] Other Clinician: Referring : Treating /Extender: [name] in Treatment: 11 Visit Information History Since Last Visit Added or deleted any medications: No Patient Arrived: Wheel Chair Any new allergies or adverse reactions: No Arrival Time: 10:30 Has Dressing in Place as Prescribed: Yes Accompanied By: self Pain Present Now: No Transfer Assistance: None Patient Identification Verified: Yes Secondary Verification Process Completed: Yes Patient Requires Transmission-Based No Precautions: Patient Has Alerts: Yes Patient Alerts: Patient on Blood Thinner NOT diabetic aspirin 81mg  Lives [place] [facility] ABI R 0.81 11/05/22 ABI L 0.59 11/05/22 Electronic Signature(s) Signed: 02/27/2023 1:16:50 PM By: Midge Aver MSN RN CNS WTA Entered By: Midge Aver on 02/26/2023 10:52:08 -------------------------------------------------------------------------------- Clinic Level of Care Assessment Details Patient Name: Date of Service: Chad [name], Chad [name] 02/26/2023 10:15 A M Medical Record Number: [id number] Patient Account Number: [id number] Date of Birth/Sex: Treating RN: Jul 23, 1961 (62 y.o. [name] Primary Care : [name] Other Clinician: Referring : Treating /Extender: [name] Weeks in Treatment: 63 Clinic Level of Care Assessment Items TOOL 4 Quantity Score X- 1 0 Use when only an EandM is performed on FOLLOW-UP visit ASSESSMENTS - Nursing Assessment / Reassessment X- 1 10 Reassessment of Co-morbidities (includes updates in  patient status) Chad [name], Chad [name] ([id number]) [id number] Page 2 of 19 X- 1 5 Reassessment of Adherence to Treatment Plan ASSESSMENTS - Wound and Skin A ssessment / Reassessment []  - 0 Simple Wound Assessment / Reassessment - one wound X- 7 5 Complex Wound Assessment / Reassessment - multiple wounds []  - 0 Dermatologic / Skin Assessment (not related to wound area) ASSESSMENTS - Focused Assessment []  - 0 Circumferential Edema Measurements - multi extremities []  - 0 Nutritional Assessment / Counseling / Intervention []  - 0 Lower Extremity Assessment (monofilament, tuning fork, pulses) []  - 0 Peripheral Arterial Disease Assessment (using hand held doppler) ASSESSMENTS - Ostomy and/or Continence Assessment and Care []  - 0 Incontinence Assessment and Management []  - 0 Ostomy Care Assessment and Management (repouching, etc.) PROCESS - Coordination of Care []  - 0 Simple Patient / Family Education for ongoing care X- 1 20 Complex (extensive) Patient / Family Education for ongoing care X- 1 10 Staff obtains Chiropractor, Records, T Results / Process Orders est []  - 0 Staff telephones HHA, Nursing Homes / Clarify orders / etc []  - 0 Routine Transfer to another Facility (non-emergent condition) []  - 0 Routine Hospital Admission (non-emergent condition) []  - 0 New Admissions / Manufacturing engineer / Ordering NPWT Apligraf, etc. , X- 1 20 Emergency Hospital Admission (emergent condition) []  - 0 Simple Discharge Coordination X- 1 15 Complex (extensive) Discharge Coordination PROCESS - Special Needs []  - 0 Pediatric / Minor Patient Management []  - 0 Isolation Patient Management []  - 0 Hearing / Language / Visual special needs []  - 0 Assessment of Community assistance (transportation, D/C planning, etc.) []  - 0 Additional assistance / Altered mentation []  - 0 Support Surface(s) Assessment (bed, cushion, seat, etc.) INTERVENTIONS - Wound Cleansing  / Measurement []  - 0 Simple Wound Cleansing - one wound X- 7 5 Complex Wound Cleansing - multiple wounds X- 1 5 Wound Imaging (photographs - any number of wounds) []  - 0 Wound Tracing (instead  of photographs) []  - 0 Simple Wound Measurement - one wound X- 7 5 Complex Wound Measurement - multiple wounds INTERVENTIONS - Wound Dressings X - Small Wound Dressing one or multiple wounds 2 10 X- 4 15 Medium Wound Dressing one or multiple wounds X- 1 20 Large Wound Dressing one or multiple wounds []  - 0 Application of Medications - topical []  - 0 Application of Medications - injection [name] ([id number]) [id number] Page 3 of 19 INTERVENTIONS - Miscellaneous []  - 0 External ear exam []  - 0 Specimen Collection (cultures, biopsies, blood, body fluids, etc.) []  - 0 Specimen(s) / Culture(s) sent or taken to Lab for analysis []  - 0 Patient Transfer (multiple staff / Michiel Sites Lift / Similar devices) []  - 0 Simple Staple / Suture removal (25 or less) []  - 0 Complex Staple / Suture removal (26 or more) []  - 0 Hypo / Hyperglycemic Management (close monitor of Blood Glucose) []  - 0 Ankle / Brachial Index (ABI) - do not check if billed separately X- 1 5 Vital Signs Has the patient been seen at the hospital within the last three years: Yes Total Score: 295 Level Of Care: New/Established - Level 5 Electronic Signature(s) Signed: 02/27/2023 1:16:50 PM By: Midge Aver MSN RN CNS WTA Entered By: Midge Aver on 02/26/2023 12:16:48 -------------------------------------------------------------------------------- Encounter Discharge Information Details Patient Name: Date of Service: Chad [name] 02/26/2023 10:15 A M Medical Record Number: [id number] Patient Account Number: [id number] Date of Birth/Sex: Treating RN: [birth year] (62 y.o. [name] Primary Care : [name] Other Clinician: Referring : Treating /Extender:  [name] Weeks in Treatment: 11 Encounter Discharge Information Items Discharge Condition: Unstable Ambulatory Status: Wheelchair Discharge Destination: Hospital Telephoned: Yes Spoke With: Charge Nurse Orders Sent: Yes Transportation: Ambulance Accompanied By: self Schedule Follow-up Appointment: Yes Clinical Summary of Care: Electronic Signature(s) Signed: 02/26/2023 12:18:46 PM By: Midge Aver MSN RN CNS WTA Entered By: Midge Aver on 02/26/2023 12:18:46 [name] ([id number]) [id number] Page 4 of 19 -------------------------------------------------------------------------------- General Visit Notes Details Patient Name: Date of Service: Chad [name], Chad [name] 02/26/2023 10:15 A M Medical Record Number: [id number] Patient Account Number: [id number] Date of Birth/Sex: Treating RN: 11-03-1960 (62 y.o. [name] Primary Care : [name] Other Clinician: Referring : Treating /Extender: [name] in Treatment: 52 Notes Patient was reluctant to go to the ER because he was concerned about money he has in between his sheets and the mattress at [facility]. Spoke with Tiwana (DON) at [facility] and she got the money which was in 2 envelopes and put it in her office in a lock box. She also found some change and put it with the 2 envelopes. Electronic Signature(s) Signed: 02/26/2023 12:21:43 PM By: Midge Aver MSN RN CNS WTA Previous Signature: 02/26/2023 12:21:39 PM Version By: Midge Aver MSN RN CNS WTA Entered By: Midge Aver on 02/26/2023 12:21:43 -------------------------------------------------------------------------------- Lower Extremity Assessment Details Patient Name: Date of Service: Chad [name], Chad [name] 02/26/2023 10:15 A M Medical Record Number: [id number] Patient Account Number: [id number] Date of Birth/Sex: Treating RN: 30-Oct-1960 (62 y.o. [name] Primary Care : [name]  Other Clinician: Referring : Treating /Extender: [name] Weeks in Treatment: 63 Edema Assessment Assessed: Kyra Searles: Yes] Franne Forts: Yes] Edema: [Left: Yes] [Right: Yes] Calf Left: Right: Point of Measurement: 34 cm From Medial Instep 39.5 cm 37.8 cm Ankle Left: Right: Point of Measurement: 12 cm From Medial Instep 25.5 cm 24.4 cm Vascular Assessment Extremity colors, hair growth, and conditions:  Extremity Color: [Left:Red] [Right:Red] Hair Growth on Extremity: [Left:No] [Right:No] Temperature of Extremity: [Left:Warm] [Right:Warm] Dependent Rubor: [Left:No] [Right:No] Blanched when Elevated: [Left:No] [Right:No] Grey, [place] ([id number]) [Left:No] [Right:[id number] Page 5 of 19 No] Notes Patient in a lot of pain. Can no tolerate and has wounds interfering with test Electronic Signature(s) Signed: 02/26/2023 12:13:40 PM By: Midge Aver MSN RN CNS WTA Entered By: Midge Aver on 02/26/2023 12:13:39 -------------------------------------------------------------------------------- Multi Wound Chart Details Patient Name: Date of Service: Chad [name] 02/26/2023 10:15 A M Medical Record Number: [id number] Patient Account Number: [id number] Date of Birth/Sex: Treating RN: 02-15-1961 (62 y.o. [name] Primary Care : [name] Other Clinician: Referring : Treating /Extender: [name] Weeks in Treatment: 90 Vital Signs Height(in): 69 Pulse(bpm): 105 Weight(lbs): 150 Blood Pressure(mmHg): 96/64 Body Mass Index(BMI): 22.1 Temperature(F): 97.8 Respiratory Rate(breaths/min): 18 [12:Photos:] Left, Distal, Medial Ankle Left, Dorsal Foot Right Calcaneus Wound Location: Gradually Appeared Gradually Appeared Gradually Appeared Wounding Event: Venous Leg Ulcer Venous Leg Ulcer Pressure Ulcer Primary Etiology: Anemia, Chronic Obstructive Anemia, Chronic Obstructive Anemia,  Chronic Obstructive Comorbid History: Pulmonary Disease (COPD), Coronary Pulmonary Disease (COPD), Coronary Pulmonary Disease (COPD), Coronary Artery Disease, Peripheral Arterial Artery Disease, Peripheral Arterial Artery Disease, Peripheral Arterial Disease, Peripheral Venous Disease, Disease, Peripheral Venous Disease, Disease, Peripheral Venous Disease, Hepatitis C, Osteoarthritis, Hepatitis C, Osteoarthritis, Hepatitis C, Osteoarthritis, Neuropathy, Received Chemotherapy Neuropathy, Received Chemotherapy Neuropathy, Received Chemotherapy 07/12/2019 10/01/2022 09/26/2022 Date Acquired: 63 20 20 Weeks of Treatment: Open Open Open Wound Status: No No No Wound Recurrence: No Yes Yes Clustered Wound: 2.7x1.3x0.2 10.5x7x0.4 6x2x0.2 Measurements L x W x D (cm) 2.757 57.727 9.425 A (cm) : rea 0.551 23.091 1.885 Volume (cm) : 60.10% -2350.20% -900.50% % Reduction in Area: 60.10% -9684.30% -902.70% % Reduction in Volume: Full Thickness Without Exposed Full Thickness Without Exposed Category/Stage III Classification: Support Structures Support Structures Medium Medium Medium Exudate Amount: Serosanguineous Serosanguineous Serosanguineous Exudate Type: red, brown red, brown red, brown Exudate Color: Large (67-100%) Small (1-33%) Small (1-33%) Granulation Amount: Pink Pink Red, Pink Granulation Quality: Small (1-33%) Large (67-100%) Large (67-100%) Necrotic Amount: [name] ([id number]) [id number] Page 6 of 19 Adherent [address], Adherent [employer] Necrotic Tissue: Fat Layer (Subcutaneous Tissue): Yes Fat Layer (Subcutaneous Tissue): Yes Fat Layer (Subcutaneous Tissue): Yes Exposed Structures: Fascia: No Fascia: No Fascia: No Tendon: No Tendon: No Tendon: No Muscle: No Muscle: No Muscle: No Joint: No Bone: No Joint: No Bone: No Bone: No N/A None None Epithelialization: Wound Number: 17 18 19  Photos: Left,  Anterior Lower Leg Right, Medial T Second oe Left, Posterior Lower Leg Wound Location: Pressure Injury Pressure Injury Gradually Appeared Wounding Event: Pressure Ulcer Pressure Ulcer Venous Leg Ulcer Primary Etiology: Anemia, Chronic Obstructive Anemia, Chronic Obstructive Anemia, Chronic Obstructive Comorbid History: Pulmonary Disease (COPD), Coronary Pulmonary Disease (COPD), Coronary Pulmonary Disease (COPD), Coronary Artery Disease, Peripheral Arterial Artery Disease, Peripheral Arterial Artery Disease, Peripheral Arterial Disease, Peripheral Venous Disease, Disease, Peripheral Venous Disease, Disease, Peripheral Venous Disease, Hepatitis C, Osteoarthritis, Hepatitis C, Osteoarthritis, Hepatitis C, Osteoarthritis, Neuropathy, Received Chemotherapy Neuropathy, Received Chemotherapy Neuropathy, Received Chemotherapy 09/30/2022 09/26/2022 11/10/2022 Date Acquired: 19 19 15  Weeks of Treatment: Open Open Open Wound Status: No No No Wound Recurrence: No No Yes Clustered Wound: 5x1.7x0.2 2x1x0.1 2x2x0.2 Measurements L x W x D (cm) 6.676 1.571 3.142 A (cm) : rea 1.335 0.157 0.628 Volume (cm) : -61.90% -185.60% -344.40% % Reduction in Area: -224.00% -185.50% -784.50% % Reduction in Volume: Category/Stage III Category/Stage III Full Thickness Without  Exposed Classification: Support Structures Medium Medium Medium Exudate A mount: Serosanguineous Serosanguineous Serous Exudate Type: red, brown red, brown amber Exudate Color: Large (67-100%) Small (1-33%) Small (1-33%) Granulation Amount: Red, Pink Red, Pink Red, Pink Granulation Quality: Small (1-33%) Medium (34-66%) Large (67-100%) Necrotic Amount: Adherent Slough Adherent [facility] Necrotic Tissue: N/A Fat Layer (Subcutaneous Tissue): Yes Fat Layer (Subcutaneous Tissue): Yes Exposed Structures: Fascia: No Fascia: No Tendon: No Tendon: No Muscle: No Muscle: No Joint: No Joint: No Bone: No Bone:  No None None None Epithelialization: Wound Number: 20 N/A N/A Photos: N/A N/A Left, Proximal, Medial Foot N/A N/A Wound Location: Pressure Injury N/A N/A Wounding Event: Pressure Ulcer N/A N/A Primary Etiology: Anemia, Chronic Obstructive N/A N/A Comorbid History: Pulmonary Disease (COPD), Coronary Artery Disease, Peripheral Arterial Disease, Peripheral Venous Disease, Hepatitis C, Osteoarthritis, Neuropathy, Received Chemotherapy 02/20/2023 N/A N/A Date Acquired: 0 N/A N/A Weeks of Treatment: Open N/A N/A Wound Status: No N/A N/A Wound Recurrence: No N/A N/A Clustered Wound: 1.5x1.5x0.1 N/A N/A Measurements L x W x D (cm) 1.767 N/A N/A A (cm) : rea 0.177 N/A N/A Volume (cm) : N/A N/A N/A % Reduction in Area: N/A N/A N/A % Reduction in VolumeLenda Kelp ([id number]) [id number] Page 7 of 19 Unstageable/Unclassified N/A N/A Classification: Medium N/A N/A Exudate A mount: Serosanguineous N/A N/A Exudate Type: red, brown N/A N/A Exudate Color: None Present (0%) N/A N/A Granulation A mount: N/A N/A N/A Granulation Quality: Large (67-100%) N/A N/A Necrotic A mount: Eschar N/A N/A Necrotic Tissue: Fat Layer (Subcutaneous Tissue): Yes N/A N/A Exposed Structures: Fascia: No Tendon: No Muscle: No Joint: No Bone: No None N/A N/A Epithelialization: Treatment Notes Electronic Signature(s) Signed: 02/26/2023 12:13:55 PM By: Midge Aver MSN RN CNS WTA Entered By: Midge Aver on 02/26/2023 12:13:54 -------------------------------------------------------------------------------- Multi-Disciplinary Care Plan Details Patient Name: Date of Service: Chad [name] 02/26/2023 10:15 A M Medical Record Number: [id number] Patient Account Number: [id number] Date of Birth/Sex: Treating RN: 1961-02-22 (62 y.o. [name] Primary Care : [name] Other Clinician: Referring : Treating /Extender: [name] Weeks in Treatment: 74 Active Inactive Venous Leg Ulcer Nursing Diagnoses: Knowledge deficit related to disease process and management Goals: Patient will maintain optimal edema control Date Initiated: 12/06/2021 Target Resolution Date: 03/26/2023 Goal Status: Active Patient/caregiver will verbalize understanding of disease process and disease management Date Initiated: 12/06/2021 Date Inactivated: 12/19/2022 Target Resolution Date: 11/27/2022 Goal Status: Met Interventions: Assess peripheral edema status every visit. Compression as ordered Notes: Electronic Signature(s) Signed: 02/26/2023 12:17:09 PM By: Midge Aver MSN RN CNS WTA Entered By: Midge Aver on 02/26/2023 12:17:09 [name] ([id number]) [id number] Page 8 of 19 -------------------------------------------------------------------------------- Pain Assessment Details Patient Name: Date of Service: Chad [name], Chad [name] 02/26/2023 10:15 A M Medical Record Number: [id number] Patient Account Number: [id number] Date of Birth/Sex: Treating RN: 1961/03/26 (62 y.o. [name] Primary Care : [name] Other Clinician: Referring : Treating /Extender: [name] Weeks in Treatment: 3 Active Problems Location of Pain Severity and Description of Pain Patient Has Paino Yes Site Locations Rate the pain. Current Pain Level: 10 Pain Management and Medication Current Pain Management: Electronic Signature(s) Signed: 02/26/2023 12:13:30 PM By: Midge Aver MSN RN CNS WTA Entered By: Midge Aver on 02/26/2023 12:13:30 -------------------------------------------------------------------------------- Patient/Caregiver Education Details Patient Name: Date of Service: [place], RO [name] 8/1/2024andnbsp10:15 A M Medical Record Number: [id number] Patient Account Number: [id number] Date of Birth/Gender: Treating RN: March 21, 1961 (62 y.o. [name] Primary Care Physician: Hart [place],  Arne Cleveland Other Clinician: Referring Physician: Treating Physician/Extender: [name] in Treatment: [address] [place] ([id number]) [id number] Page 9 of 19 Education Assessment Education Provided To: Patient Education Topics Provided Wound/Skin Impairment: Handouts: Caring for Your Ulcer Methods: Explain/Verbal Responses: State content correctly Electronic Signature(s) Signed: 02/27/2023 1:16:50 PM By: Midge Aver MSN RN CNS WTA Entered By: Midge Aver on 02/26/2023 12:17:21 -------------------------------------------------------------------------------- Wound Assessment Details Patient Name: Date of Service: Chad [name] 02/26/2023 10:15 A M Medical Record Number: [id number] Patient Account Number: [id number] Date of Birth/Sex: Treating RN: Dec 28, 1960 (62 y.o. [name] Primary Care : [name] Other Clinician: Referring : Treating /Extender: [name] Weeks in Treatment: 63 Wound Status Wound Number: 12 Primary Venous Leg Ulcer Etiology: Wound Location: Left, Distal, Medial Ankle Wound Open Wounding Event: Gradually Appeared Status: Date Acquired: 07/12/2019 Comorbid Anemia, Chronic Obstructive Pulmonary Disease (COPD), Coronary Weeks Of Treatment: 63 History: Artery Disease, Peripheral Arterial Disease, Peripheral Venous Clustered Wound: No Disease, Hepatitis C, Osteoarthritis, Neuropathy, Received Chemotherapy Photos Wound Measurements Length: (cm) 2.7 Width: (cm) 1.3 Depth: (cm) 0.2 Area: (cm) 2.757 Volume: (cm) 0.551 % Reduction in Area: 60.1% % Reduction in Volume: 60.1% Wound Description Classification: Full Thickness Without Exposed Support Structures Exudate Amount: Medium Chad [name], Chad [name] ([id number]) Exudate Type: Serosanguineous Exudate Color: red, brown [id number] Page 10 of 19 Wound  Bed Granulation Amount: Large (67-100%) Exposed Structure Granulation Quality: Pink Fascia Exposed: No Necrotic Amount: Small (1-33%) Fat Layer (Subcutaneous Tissue) Exposed: Yes Necrotic Quality: Adherent Slough Tendon Exposed: No Muscle Exposed: No Joint Exposed: No Bone Exposed: No Treatment Notes Wound #12 (Ankle) Wound Laterality: Left, Medial, Distal Cleanser Soap and Water Discharge Instruction: Gently cleanse wound with antibacterial soap, rinse and pat dry prior to dressing wounds Wound Cleanser Discharge Instruction: Wash your hands with soap and water. Remove old dressing, discard into plastic bag and place into trash. Cleanse the wound with Wound Cleanser prior to applying a clean dressing using gauze sponges, not tissues or cotton balls. Do not scrub or use excessive force. Pat dry using gauze sponges, not tissue or cotton balls. Peri-Wound Care Topical Primary Dressing Silvercel Small 2x2 (in/in) Discharge Instruction: Apply Silvercel Small 2x2 (in/in) as instructed Secondary Dressing ABD Pad 5x9 (in/in) Discharge Instruction: Cover with ABD pad Secured With Kerlix Roll Sterile or Non-Sterile 6-ply 4.5x4 (yd/yd) Discharge Instruction: Apply Kerlix as directed Compression Wrap Compression Stockings Add-Ons Electronic Signature(s) Signed: 02/27/2023 1:16:50 PM By: Midge Aver MSN RN CNS WTA Entered By: Midge Aver on 02/26/2023 10:56:43 -------------------------------------------------------------------------------- Wound Assessment Details Patient Name: Date of Service: Chad [name] 02/26/2023 10:15 A M Medical Record Number: [id number] Patient Account Number: [id number] Date of Birth/Sex: Treating RN: 1960/10/15 (62 y.o. [name] Primary Care : [name] Other Clinician: Referring : Treating /Extender: [name] Weeks in Treatment: 63 Wound Status Wound Number: 15 Primary Venous Leg  Ulcer Etiology: Wound Location: Left, Dorsal Foot Chad [name], Chad [name] ([id number]) [id number] Page 11 of 19 Wound Open Wounding Event: Gradually Appeared Status: Date Acquired: 10/01/2022 Comorbid Anemia, Chronic Obstructive Pulmonary Disease (COPD), Coronary Weeks Of Treatment: 20 History: Artery Disease, Peripheral Arterial Disease, Peripheral Venous Clustered Wound: Yes Disease, Hepatitis C, Osteoarthritis, Neuropathy, Received Chemotherapy Photos Wound Measurements Length: (cm) 10.5 Width: (cm) 7 Depth: (cm) 0.4 Area: (cm) 57.727 Volume: (cm) 23.091 % Reduction in Area: -2350.2% % Reduction in Volume: -9684.3% Epithelialization: None Wound Description Classification: Full Thickness Without Exposed Support Structures Exudate Amount: Medium Exudate Type: Serosanguineous Exudate Color: red, brown Foul Odor  After Cleansing: No Slough/Fibrino Yes Wound Bed Granulation Amount: Small (1-33%) Exposed Structure Granulation Quality: Pink Fascia Exposed: No Necrotic Amount: Large (67-100%) Fat Layer (Subcutaneous Tissue) Exposed: Yes Necrotic Quality: Eschar, Adherent Slough Tendon Exposed: No Muscle Exposed: No Bone Exposed: No Treatment Notes Wound #15 (Foot) Wound Laterality: Dorsal, Left Cleanser Soap and Water Discharge Instruction: Gently cleanse wound with antibacterial soap, rinse and pat dry prior to dressing wounds Wound Cleanser Discharge Instruction: Wash your hands with soap and water. Remove old dressing, discard into plastic bag and place into trash. Cleanse the wound with Wound Cleanser prior to applying a clean dressing using gauze sponges, not tissues or cotton balls. Do not scrub or use excessive force. Pat dry using gauze sponges, not tissue or cotton balls. Peri-Wound Care Topical Primary Dressing Silvercel Small 2x2 (in/in) Discharge Instruction: Apply Silvercel Small 2x2 (in/in) as instructed Secondary Dressing ABD Pad 5x9  (in/in) Discharge Instruction: Cover with ABD pad Secured With Kerlix Roll Sterile or Non-Sterile 6-ply 4.5x4 (yd/yd) Discharge Instruction: Apply Kerlix as directed Compression Wrap Compression Stockings Add-Ons [name] ([id number]) [id number] Page 12 of 19 Electronic Signature(s) Signed: 02/27/2023 1:16:50 PM By: Midge Aver MSN RN CNS WTA Entered By: Midge Aver on 02/26/2023 10:57:20 -------------------------------------------------------------------------------- Wound Assessment Details Patient Name: Date of Service: Chad [name], Chad [name] 02/26/2023 10:15 A M Medical Record Number: [id number] Patient Account Number: [id number] Date of Birth/Sex: Treating RN: 1960-07-31 (62 y.o. [name] Primary Care : [name] Other Clinician: Referring : Treating /Extender: [name] Weeks in Treatment: 63 Wound Status Wound Number: 16 Primary Pressure Ulcer Etiology: Wound Location: Right Calcaneus Wound Open Wounding Event: Gradually Appeared Status: Date Acquired: 09/26/2022 Comorbid Anemia, Chronic Obstructive Pulmonary Disease (COPD), Coronary Weeks Of Treatment: 20 History: Artery Disease, Peripheral Arterial Disease, Peripheral Venous Clustered Wound: Yes Disease, Hepatitis C, Osteoarthritis, Neuropathy, Received Chemotherapy Photos Wound Measurements Length: (cm) 6 Width: (cm) 2 Depth: (cm) 0.2 Area: (cm) 9.425 Volume: (cm) 1.885 % Reduction in Area: -900.5% % Reduction in Volume: -902.7% Epithelialization: None Wound Description Classification: Category/Stage III Exudate Amount: Medium Exudate Type: Serosanguineous Exudate Color: red, brown Slough/Fibrino Yes Wound Bed Granulation Amount: Small (1-33%) Exposed Structure Granulation Quality: Red, Pink Fascia Exposed: No Necrotic Amount: Large (67-100%) Fat Layer (Subcutaneous Tissue) Exposed: Yes Necrotic Quality: Eschar, Adherent  Slough Tendon Exposed: No Muscle Exposed: No Joint Exposed: No Bone Exposed: No Treatment Notes DERWIN, REDDY ([id number]) [id number] Page 13 of 19 Wound #16 (Calcaneus) Wound Laterality: Right Cleanser Soap and Water Discharge Instruction: Gently cleanse wound with antibacterial soap, rinse and pat dry prior to dressing wounds Wound Cleanser Discharge Instruction: Wash your hands with soap and water. Remove old dressing, discard into plastic bag and place into trash. Cleanse the wound with Wound Cleanser prior to applying a clean dressing using gauze sponges, not tissues or cotton balls. Do not scrub or use excessive force. Pat dry using gauze sponges, not tissue or cotton balls. Peri-Wound Care Topical Primary Dressing Silvercel Small 2x2 (in/in) Discharge Instruction: Apply Silvercel Small 2x2 (in/in) as instructed Secondary Dressing ABD Pad 5x9 (in/in) Discharge Instruction: Cover with ABD pad Secured With Kerlix Roll Sterile or Non-Sterile 6-ply 4.5x4 (yd/yd) Discharge Instruction: Apply Kerlix as directed Compression Wrap Compression Stockings Add-Ons Electronic Signature(s) Signed: 02/27/2023 1:16:50 PM By: Midge Aver MSN RN CNS WTA Entered By: Midge Aver on 02/26/2023 10:58:36 -------------------------------------------------------------------------------- Wound Assessment Details Patient Name: Date of Service: Chad [name] 02/26/2023 10:15 A M Medical Record Number: [id number] Patient Account Number: [id number] Date of  Birth/Sex: Treating RN: 09/01/1960 (62 y.o. [name] Primary Care : [name] Other Clinician: Referring : Treating /Extender: [name] Weeks in Treatment: 63 Wound Status Wound Number: 17 Primary Pressure Ulcer Etiology: Wound Location: Left, Anterior Lower Leg Wound Open Wounding Event: Pressure Injury Status: Date Acquired: 09/30/2022 Comorbid Anemia, Chronic  Obstructive Pulmonary Disease (COPD), Coronary Weeks Of Treatment: 19 History: Artery Disease, Peripheral Arterial Disease, Peripheral Venous Clustered Wound: No Disease, Hepatitis C, Osteoarthritis, Neuropathy, Received Chemotherapy Photos Chad [name], KNEALE ([id number]) [id number] Page 14 of 19 Wound Measurements Length: (cm) 5 Width: (cm) 1.7 Depth: (cm) 0.2 Area: (cm) 6.676 Volume: (cm) 1.335 % Reduction in Area: -61.9% % Reduction in Volume: -224% Epithelialization: None Wound Description Classification: Category/Stage III Exudate Amount: Medium Exudate Type: Serosanguineous Exudate Color: red, brown Foul Odor After Cleansing: No Slough/Fibrino Yes Wound Bed Granulation Amount: Large (67-100%) Granulation Quality: Red, Pink Necrotic Amount: Small (1-33%) Necrotic Quality: Adherent Slough Treatment Notes Wound #17 (Lower Leg) Wound Laterality: Left, Anterior Cleanser Soap and Water Discharge Instruction: Gently cleanse wound with antibacterial soap, rinse and pat dry prior to dressing wounds Wound Cleanser Discharge Instruction: Wash your hands with soap and water. Remove old dressing, discard into plastic bag and place into trash. Cleanse the wound with Wound Cleanser prior to applying a clean dressing using gauze sponges, not tissues or cotton balls. Do not scrub or use excessive force. Pat dry using gauze sponges, not tissue or cotton balls. Peri-Wound Care Topical Primary Dressing Silvercel Small 2x2 (in/in) Discharge Instruction: Apply Silvercel Small 2x2 (in/in) as instructed Secondary Dressing ABD Pad 5x9 (in/in) Discharge Instruction: Cover with ABD pad Secured With Kerlix Roll Sterile or Non-Sterile 6-ply 4.5x4 (yd/yd) Discharge Instruction: Apply Kerlix as directed Compression Wrap Compression Stockings Add-Ons Electronic Signature(s) Signed: 02/27/2023 1:16:50 PM By: Midge Aver MSN RN CNS WTA Entered By: Midge Aver on  02/26/2023 10:59:24 [name] ([id number]) [id number] Page 15 of 19 -------------------------------------------------------------------------------- Wound Assessment Details Patient Name: Date of Service: BRISTON, LAX 02/26/2023 10:15 A M Medical Record Number: [id number] Patient Account Number: [id number] Date of Birth/Sex: Treating RN: [birth year] (61 y.o. [name] Primary Care : [name] Other Clinician: Referring : Treating /Extender: [name] Weeks in Treatment: 63 Wound Status Wound Number: 18 Primary Pressure Ulcer Etiology: Wound Location: Right, Medial T Second oe Wound Open Wounding Event: Pressure Injury Status: Date Acquired: 09/26/2022 Comorbid Anemia, Chronic Obstructive Pulmonary Disease (COPD), Coronary Weeks Of Treatment: 19 History: Artery Disease, Peripheral Arterial Disease, Peripheral Venous Clustered Wound: No Disease, Hepatitis C, Osteoarthritis, Neuropathy, Received Chemotherapy Photos Wound Measurements Length: (cm) 2 Width: (cm) 1 Depth: (cm) 0.1 Area: (cm) 1.571 Volume: (cm) 0.157 % Reduction in Area: -185.6% % Reduction in Volume: -185.5% Epithelialization: None Wound Description Classification: Category/Stage III Exudate Amount: Medium Exudate Type: Serosanguineous Exudate Color: red, brown Foul Odor After Cleansing: No Slough/Fibrino Yes Wound Bed Granulation Amount: Small (1-33%) Exposed Structure Granulation Quality: Red, Pink Fascia Exposed: No Necrotic Amount: Medium (34-66%) Fat Layer (Subcutaneous Tissue) Exposed: Yes Necrotic Quality: Adherent Slough Tendon Exposed: No Muscle Exposed: No Joint Exposed: No Bone Exposed: No Treatment Notes Wound #18 (Toe Second) Wound Laterality: Right, Medial Cleanser Soap and Water Discharge Instruction: Gently cleanse wound with antibacterial soap, rinse and pat dry prior to dressing wounds [name] ([id number]) [id number] Page 16 of 19 Wound Cleanser Discharge Instruction: Wash your hands with soap and water. Remove old dressing, discard into plastic bag and place into trash. Cleanse the wound with Wound  Cleanser prior to applying a clean dressing using gauze sponges, not tissues or cotton balls. Do not scrub or use excessive force. Pat dry using gauze sponges, not tissue or cotton balls. Peri-Wound Care Topical Primary Dressing Silvercel Small 2x2 (in/in) Discharge Instruction: Apply Silvercel Small 2x2 (in/in) as instructed Secondary Dressing ABD Pad 5x9 (in/in) Discharge Instruction: Cover with ABD pad Secured With Kerlix Roll Sterile or Non-Sterile 6-ply 4.5x4 (yd/yd) Discharge Instruction: Apply Kerlix as directed Compression Wrap Compression Stockings Add-Ons Electronic Signature(s) Signed: 02/27/2023 1:16:50 PM By: Midge Aver MSN RN CNS WTA Entered By: Midge Aver on 02/26/2023 11:00:35 -------------------------------------------------------------------------------- Wound Assessment Details Patient Name: Date of Service: Chad [name] 02/26/2023 10:15 A M Medical Record Number: [id number] Patient Account Number: [id number] Date of Birth/Sex: Treating RN: 1960-08-10 (62 y.o. [name] Primary Care : [name] Other Clinician: Referring : Treating /Extender: [name] Weeks in Treatment: 63 Wound Status Wound Number: 19 Primary Venous Leg Ulcer Etiology: Wound Location: Left, Posterior Lower Leg Wound Open Wounding Event: Gradually Appeared Status: Date Acquired: 11/10/2022 Comorbid Anemia, Chronic Obstructive Pulmonary Disease (COPD), Coronary Weeks Of Treatment: 15 History: Artery Disease, Peripheral Arterial Disease, Peripheral Venous Clustered Wound: Yes Disease, Hepatitis C, Osteoarthritis, Neuropathy, Received Chemotherapy Photos THORNE, WIRZ ([id number])  [id number] Page 17 of 19 Wound Measurements Length: (cm) 2 Width: (cm) 2 Depth: (cm) 0.2 Area: (cm) 3.142 Volume: (cm) 0.628 % Reduction in Area: -344.4% % Reduction in Volume: -784.5% Epithelialization: None Wound Description Classification: Full Thickness Without Exposed Support Structures Exudate Amount: Medium Exudate Type: Serous Exudate Color: amber Foul Odor After Cleansing: No Slough/Fibrino Yes Wound Bed Granulation Amount: Small (1-33%) Exposed Structure Granulation Quality: Red, Pink Fascia Exposed: No Necrotic Amount: Large (67-100%) Fat Layer (Subcutaneous Tissue) Exposed: Yes Necrotic Quality: Adherent Slough Tendon Exposed: No Muscle Exposed: No Joint Exposed: No Bone Exposed: No Treatment Notes Wound #19 (Lower Leg) Wound Laterality: Left, Posterior Cleanser Soap and Water Discharge Instruction: Gently cleanse wound with antibacterial soap, rinse and pat dry prior to dressing wounds Wound Cleanser Discharge Instruction: Wash your hands with soap and water. Remove old dressing, discard into plastic bag and place into trash. Cleanse the wound with Wound Cleanser prior to applying a clean dressing using gauze sponges, not tissues or cotton balls. Do not scrub or use excessive force. Pat dry using gauze sponges, not tissue or cotton balls. Peri-Wound Care Topical Primary Dressing Silvercel Small 2x2 (in/in) Discharge Instruction: Apply Silvercel Small 2x2 (in/in) as instructed Secondary Dressing ABD Pad 5x9 (in/in) Discharge Instruction: Cover with ABD pad Secured With Kerlix Roll Sterile or Non-Sterile 6-ply 4.5x4 (yd/yd) Discharge Instruction: Apply Kerlix as directed Compression Wrap Compression Stockings Add-Ons Electronic Signature(s) Signed: 02/27/2023 1:16:50 PM By: Midge Aver MSN RN CNS WTA Entered By: Midge Aver on 02/26/2023 11:01:39 [name] ([id number]) [id number] Page 18 of  19 -------------------------------------------------------------------------------- Wound Assessment Details Patient Name: Date of Service: Chad [name], Chad [name] 02/26/2023 10:15 A M Medical Record Number: [id number] Patient Account Number: [id number] Date of Birth/Sex: Treating RN: [birth year] (62 y.o. [name] Primary Care : [name] Other Clinician: Referring : Treating /Extender: [name] Weeks in Treatment: 63 Wound Status Wound Number: 20 Primary Pressure Ulcer Etiology: Wound Location: Left, Proximal, Medial Foot Wound Open Wounding Event: Pressure Injury Status: Date Acquired: 02/20/2023 Comorbid Anemia, Chronic Obstructive Pulmonary Disease (COPD), Coronary Weeks Of Treatment: 0 History: Artery Disease, Peripheral Arterial Disease, Peripheral Venous Clustered Wound: No Disease, Hepatitis C, Osteoarthritis, Neuropathy, Received Chemotherapy Photos Wound Measurements Length: (  cm) 1.5 Width: (cm) 1.5 Depth: (cm) 0.1 Area: (cm) 1.767 Volume: (cm) 0.177 % Reduction in Area: % Reduction in Volume: Epithelialization: None Tunneling: No Undermining: No Wound Description Classification: Unstageable/Unclassified Exudate Amount: Medium Exudate Type: Serosanguineous Exudate Color: red, brown Foul Odor After Cleansing: No Slough/Fibrino No Wound Bed Granulation Amount: None Present (0%) Exposed Structure Necrotic Amount: Large (67-100%) Fascia Exposed: No Necrotic Quality: Eschar Fat Layer (Subcutaneous Tissue) Exposed: Yes Tendon Exposed: No Muscle Exposed: No Joint Exposed: No Bone Exposed: No Treatment Notes Wound #20 (Foot) Wound Laterality: Left, Medial, Proximal Cleanser Soap and Water Discharge Instruction: Gently cleanse wound with antibacterial soap, rinse and pat dry prior to dressing wounds Wound Cleanser Discharge Instruction: Wash your hands with soap and water. Remove old dressing, discard into plastic  bag and place into trash. Cleanse the wound with Wound Cleanser prior to applying a clean dressing using gauze sponges, not tissues or cotton balls. Do not scrub or use excessive force. Pat dry using gauze sponges, not tissue or cotton balls. Peri-Wound Care Topical Primary Dressing Silvercel Small 2x2 (in/in) Discharge Instruction: Apply Silvercel Small 2x2 (in/in) as instructed ANTHONYMICHAEL, MUNDAY ([id number]) [id number] Page 19 of 19 Secondary Dressing ABD Pad 5x9 (in/in) Discharge Instruction: Cover with ABD pad Secured With Kerlix Roll Sterile or Non-Sterile 6-ply 4.5x4 (yd/yd) Discharge Instruction: Apply Kerlix as directed Compression Wrap Compression Stockings Add-Ons Electronic Signature(s) Signed: 02/27/2023 1:16:50 PM By: Midge Aver MSN RN CNS WTA Entered By: Midge Aver on 02/26/2023 10:55:54 -------------------------------------------------------------------------------- Vitals Details Patient Name: Date of Service: Chad [name] 02/26/2023 10:15 A M Medical Record Number: [id number] Patient Account Number: [id number] Date of Birth/Sex: Treating RN: 1960-10-22 (62 y.o. [name] Primary Care : [name] Other Clinician: Referring : Treating /Extender: [name] Weeks in Treatment: 63 Vital Signs Time Taken: 10:45 Temperature (F): 97.8 Height (in): 69 Pulse (bpm): 105 Weight (lbs): 150 Respiratory Rate (breaths/min): 18 Body Mass Index (BMI): 22.1 Blood Pressure (mmHg): 96/64 Reference Range: 80 - 120 mg / dl Electronic Signature(s) Signed: 02/26/2023 12:13:19 PM By: Midge Aver MSN RN CNS WTA Entered By: Midge Aver on 02/26/2023 12:13:19

## 2023-02-27 NOTE — Progress Notes (Signed)
Patient was evaluated by podiatry this afternoon for worsening left foot wound sent to the hospital by wound care.  Patient is known significant PAD with multiple interventions by Dr. Festus Barren.  Patient relates that his left foot wounds are worsening and he was advised to come to the hospital for possible amputation.  Patient relates he wants to amputate his left lower extremity below the knee to no longer have to deal with or have trouble with any wound care.  Dr. Festus Barren and Dr. [name] were made aware.  Plan will be to schedule the patient for a left below the knee amputation either next Tuesday or Wednesday.

## 2023-02-27 NOTE — Consult Note (Signed)
Subjective:  Patient ID: Chad [name], male    DOB: 22-Aug-1960,  MRN: [id number]  Patient with past medical history of tobacco abuse, urethral cancer and colon cancer and PVD seen at beside today for worsening left foot wound sent by wound care. Patient with significant PAD history and interventions. Patient relates the left foot wounds were worsening and advised to come to the hospital for amputation. Patient relates he wants to amputate and no longer have trouble with wounds. Denies n/v/f/c.    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    GERD (gastroesophageal reflux disease)    Hepatitis    chronic hep c   Hydronephrosis    Hydronephrosis with ureteral stricture    Hyperlipidemia    Insomnia, unspecified    Knee pain    Left   Major depressive disorder, recurrent, moderate (HCC)    Malignant neoplasm of colon (HCC)    Malnutrition (HCC)    Muscle weakness (generalized)    Nerve pain    Other abnormalities of gait and mobility    Other chronic pain    Peripheral vascular disease (HCC)    Personal history of transient cerebral ischemia    Pressure ulcer    Prostate cancer (HCC)    Stroke (HCC)    Tinea unguium    Tobacco user    Unspecified protein-calorie malnutrition (HCC)    Ureteral cancer, right (HCC)    Urinary frequency    Venous hypertension of both lower  extremities    Xerosis cutis      Past Surgical History:  Procedure Laterality Date   COLON SURGERY     En bloc extended right hemicolectomy 07/2017   COLONOSCOPY WITH PROPOFOL N/A 11/06/2020   Procedure: COLONOSCOPY WITH PROPOFOL;  Surgeon: Wyline Mood, MD;  Location: [facility] ENDOSCOPY;  Service: Gastroenterology;  Laterality: N/A;   COLONOSCOPY WITH PROPOFOL N/A 07/31/2021   Procedure: COLONOSCOPY WITH PROPOFOL;  Surgeon: Earline Mayotte, MD;  Location: [facility] ENDOSCOPY;  Service: Endoscopy;  Laterality: N/A;   CYSTOSCOPY W/ RETROGRADES Right 08/30/2018   Procedure: CYSTOSCOPY WITH RETROGRADE PYELOGRAM;  Surgeon: Vanna Scotland, MD;  Location: [facility] ORS;  Service: Urology;  Laterality: Right;   CYSTOSCOPY WITH STENT PLACEMENT Right 04/25/2018   Procedure: CYSTOSCOPY WITH STENT PLACEMENT;  Surgeon: Vanna Scotland, MD;  Location: [facility] ORS;  Service: Urology;  Laterality: Right;   CYSTOSCOPY WITH STENT PLACEMENT Right 08/30/2018   Procedure: CYSTOSCOPY WITH STENT Exchange;  Surgeon: Vanna Scotland, MD;  Location: [facility] ORS;  Service: Urology;  Laterality: Right;   CYSTOSCOPY WITH STENT PLACEMENT Right 03/07/2019   Procedure: CYSTOSCOPY WITH STENT Exchange;  Surgeon: Vanna Scotland, MD;  Location: [facility] ORS;  Service: Urology;  Laterality: Right;   CYSTOSCOPY WITH STENT PLACEMENT Right 11/21/2019   Procedure: CYSTOSCOPY WITH  STENT Exchange;  Surgeon: Vanna Scotland, MD;  Location: [facility] ORS;  Service: Urology;  Laterality: Right;   LOWER EXTREMITY ANGIOGRAPHY Left 05/23/2019   Procedure: LOWER EXTREMITY ANGIOGRAPHY;  Surgeon: Annice Needy, MD;  Location: [facility];  Service: Cardiovascular;  Laterality: Left;   LOWER EXTREMITY ANGIOGRAPHY Right 05/30/2019   Procedure: LOWER EXTREMITY ANGIOGRAPHY;  Surgeon: Annice Needy, MD;  Location: [facility];  Service: Cardiovascular;  Laterality: Right;   LOWER EXTREMITY ANGIOGRAPHY Right 02/13/2020   Procedure: LOWER EXTREMITY  ANGIOGRAPHY;  Surgeon: Annice Needy, MD;  Location: [facility];  Service: Cardiovascular;  Laterality: Right;   LOWER EXTREMITY ANGIOGRAPHY Left 02/20/2020   Procedure: LOWER EXTREMITY ANGIOGRAPHY;  Surgeon: Annice Needy, MD;  Location: [facility];  Service: Cardiovascular;  Laterality: Left;   LOWER EXTREMITY ANGIOGRAPHY Left 01/01/2023   Procedure: Lower Extremity Angiography;  Surgeon: Annice Needy, MD;  Location: [facility];  Service: Cardiovascular;  Laterality: Left;   PORTA CATH INSERTION N/A 02/28/2019   Procedure: PORTA CATH INSERTION;  Surgeon: Annice Needy, MD;  Location: [facility];  Service: Cardiovascular;  Laterality: N/A;   ROBOT ASSISTED LAPAROSCOPIC PARTIAL COLECTOMY  11/17/2022   tumor removed          Latest Ref Rng & Units 02/27/2023    6:41 AM 02/26/2023    3:37 PM 12/29/2022   10:12 AM  CBC  WBC 4.0 - 10.5 K/uL 6.4  8.0  5.5   Hemoglobin 13.0 - 17.0 g/dL 81.1  91.4  78.2   Hematocrit 39.0 - 52.0 % 32.3  38.6  38.9   Platelets 150 - 400 K/uL 191  196  141        Latest Ref Rng & Units 02/27/2023    6:41 AM 02/26/2023    3:37 PM 12/29/2022   10:12 AM  BMP  Glucose 70 - 99 mg/dL 956  213  086   BUN 8 - 23 mg/dL 24  38  17   Creatinine 0.61 - 1.24 mg/dL 5.78  4.69  6.29   Sodium 135 - 145 mmol/L 138  136  136   Potassium 3.5 - 5.1 mmol/L 3.2  4.0  3.8   Chloride 98 - 111 mmol/L 109  106  107   CO2 22 - 32 mmol/L 19  19  25    Calcium 8.9 - 10.3 mg/dL 8.3  8.8  8.5      Objective:   Vitals:   02/27/23 0626 02/27/23 0813  BP: 138/87 123/77  Pulse: 100 (!) 102  Resp:  18  Temp:  98 F (36.7 C)  SpO2: 100% 100%    General:AA&O x 3. Normal mood and affect   Vascular: DP and PT pulses 2/4 bilateral. Brisk capillary refill to all digits. Pedal hair present   Neruological. Epicritic sensation grossly intact.   Derm: Ulceration noed to second through fifth digits and tracking to the dorsum of the foot with necrotic base. No  erythema or edema present. No purulence. No  probe to bone.   MSK: MMT 5/5 in dorsiflexion, plantar flexion, inversion and eversion. Normal joint ROM without pain or crepitus.   IMPRESSION: 1. Severely motion degraded study. The degree of motion makes this study essentially nondiagnostic. Repeat study with sedation or alternative imaging such as CT (with contrast if possible) recommended. 2. Dorsal and medial subcutaneous edema without evidence of focal fluid collection. 3. T2 hyperintensity within the flexor  musculature which could be due to denervation or myositis. Possible trace extensor tendon tenosynovitis. 4. No evidence of osteomyelitis or septic joint.     Assessment & Plan:  Patient was evaluated and treated and all questions answered.  DX: Left foot necrotic wounds with worsening limb ischemia  Wound care: betadine, DSD  Discussed with patient diagnosis and treatment options.  Imaging reviewed.  Discussed with patient given vascular disease and recent arteriogram in June that showed occluded anterior tibial and posterior arteries without distal reconstitution would be best served with a below knee amputation. Patient understands and is eager to have this done. Nothing to offer from podiatry standpoint. Patient to see vascular this afternoon.   Patient in agreement with plan and all questions answered.  Podiatry to sign off at this time.   Louann Sjogren, DPM  Accessible via secure chat for questions or concerns.

## 2023-02-27 NOTE — Consult Note (Signed)
Pharmacy Consult Note - Anticoagulation  Pharmacy Consult for IV Heparin Indication:  limb ischemia  PATIENT MEASUREMENTS: Height: 5\' 7"  (170.2 cm) Weight: 64.9 kg (143 lb) IBW/kg (Calculated) : 66.1 HEPARIN DW (KG): 64.9  Recent Labs    02/26/23 1922 02/27/23 0050 02/27/23 0641 02/27/23 0845 02/27/23 1500  HGB  --   --  11.0*  --   --   HCT  --   --  32.3*  --   --   PLT  --   --  191  --   --   APTT 39*  --   --   --   --   LABPROT 15.6*  --   --   --   --   INR 1.2  --   --   --   --   HEPARINUNFRC  --    < >  --    < > 0.37  CREATININE  --   --  1.00  --   --    < > = values in this interval not displayed.    Estimated Creatinine Clearance: 68.5 mL/min (by C-G formula based on SCr of 1 mg/dL).  PAST MEDICAL HISTORY: 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    GERD (gastroesophageal reflux disease)    Hepatitis    chronic hep c   Hydronephrosis    Hydronephrosis with ureteral stricture    Hyperlipidemia    Insomnia, unspecified    Knee pain    Left   Major depressive disorder, recurrent, moderate (HCC)    Malignant neoplasm of colon (HCC)    Malnutrition (HCC)    Muscle weakness (generalized)    Nerve pain    Other abnormalities of gait and mobility    Other chronic pain    Peripheral vascular disease (HCC)    Personal history of transient cerebral  ischemia    Pressure ulcer    Prostate cancer (HCC)    Stroke (HCC)    Tinea unguium    Tobacco user    Unspecified protein-calorie malnutrition (HCC)    Ureteral cancer, right (HCC)    Urinary frequency    Venous hypertension of both lower extremities    Xerosis cutis     ASSESSMENT: 62 y.o. male with PMH PAD, chronic left foot ulceration is presenting with worsening ulceration and necrosis. Patient is not on chronic anticoagulation per chart review. Pharmacy has been consulted to initiate and manage heparin intravenous infusion.  Pertinent medications: No chronic AC PTA  Goal(s) of therapy: Heparin level 0.3 - 0.7 units/mL Monitor platelets by anticoagulation protocol: Yes   Baseline anticoagulation labs: Recent Labs    02/26/23 1537 02/26/23 1922 02/27/23 0641  APTT  --  39*  --   INR  --  1.2  --   HGB 12.8*  --  11.0*  PLT 196  --  191      PLAN: --Heparin level is therapeutic x 2 --Continue heparin infusion at 1100 units/hr --Check heparin level with AM labs tomorrow --Daily CBC per protocol while on IV heparin  Will M. Dareen Piano, PharmD Clinical Pharmacist 02/27/2023 5:00 PM

## 2023-02-27 NOTE — Progress Notes (Signed)
Progress Note   Patient: Chad [name] UJW:[id number] DOB: 1961-01-09 DOA: 02/26/2023     1 DOS: the patient was seen and examined on 02/27/2023   Brief hospital course:  Patient is a 62 y.o. 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 Wyn Quaker on aspirin and Plavix, COPD, GERD, hepatitis C, hyperlipidemia, who presented after being referred from clinic with nonhealing left extremity wound and concerns for limb ischemia. Records reviewed from 10/2022 suggest an arteriogram with near occlussion of posterior tibial arteries, Left ATA occluded but with distal reconstitution.  1/4 to 1/2 pack/day smoker.   Assessment and Plan: Non-healing wound of left lower extremity Worsening left lower extremity chronic ulcer in the setting of chronic peripheral vascular disease. Patient is followed up by Dr. Wyn Quaker with vascular surgery as well as wound care Due to concern for limb ischemia, Vascular surgery re-consulted. Currently on heparin. Angiogram done in 12/2022.Case discussed with Dr. Gilda Crease with vascular surgery as well as Dr. Allena Katz with podiatry by admitting physician. Follow up doen today. They will see later today. Cont on heparin drip. On Zosyn and vancomycin for infectious coverage MRI of the foot with and without contrast .Findings as noted below. Possible amputation of the left foot may be warranted.Will defer to podiatry team  Chronic Tobacco dependence 1/2 pack/day smoker. Nicotine patch was offered. Tobacco cessation counselling also offered  History of colon cancer s/p hemicolectomy 10/2022 w/ Dr. Maia Plan  Peripheral vascular disease of lower extremity with ulceration (HCC) Followed by Dr. Wyn Quaker. Vascular to see in house and advise further management. On heparin drip in the setting of concern for limb ischemia of the left lower extremity  Hypokalemia:   Potassium replacement protocol.Replete magnesium as appropriate  Mild NAGMA: Lactic acid was within normal limits on admission.  Chronic pain due to neoplasm Managed by palliative care Continue home oxycodone  Urothelial cancer (HCC) Baseline High-grade urothelial cancer followed by Dr. Donneta Romberg  Protein energy Malnutrition: Body mass index 22.4kg/m2. Nutritionist to evaluate and advise on treatment.Ensure TID    Subjective: Patient complains of pain .Improved with analgesics. Deneis nausea or vomiting  Physical Exam: Vitals:   02/26/23 1744 02/26/23 2031 02/27/23 0626 02/27/23 0813  BP:  114/82 138/87 123/77  Pulse:  (!) 108 100 (!) 102  Resp:  20  18  Temp:    98 F (36.7 C)  TempSrc:    Oral  SpO2:  100% 100% 100%  Weight: 64.9 kg     Height: 5\' 7"  (1.702 m)      Frail appearing gentleman , sitting up in bed.Lower extremitis hanging on side of bed.  HEENT: Neck supple.anicteric, CHEST: Clinically Diminished breath sounds FAO:ZHYQMVH S1,S2 wnm  CNS: No focal deficits EXT: Bilateral lower extremity with ulcerations. Left foot with dressing in place. Unable to assess lower extremity pulses due to dressing. Data Reviewed:  MRI: IMPRESSION: 1. Severely motion degraded study. The degree of motion makes this study essentially nondiagnostic. Repeat study with sedation or alternative imaging such as CT (with contrast if possible) recommended. 2. Dorsal and medial subcutaneous edema without evidence of focal fluid collection. 3. T2 hyperintensity within the flexor musculature which could be due to denervation or myositis. Possible trace extensor tendon tenosynovitis. 4. No evidence of osteomyelitis or septic joint. Family Communication: No family at bedside at this time  Disposition: Status is: Inpatient Remains inpatient appropriate because: Lower extremity gangrene  Planned Discharge Destination:  TBD DVT prophylaxis: On heparin gtt Time spent: 35  minutes  Author: Lilia Pro, MD 02/27/2023 10:55 AM  For on call review [email or web address].

## 2023-02-27 NOTE — Progress Notes (Signed)
[facility] Liaison note:   This patient is currently enrolled in [facility] outpatient-based palliative care.  [facility] will continue to follow for discharge disposition.    Please call for any outpatient based palliative care related questions or concerns.   [facility] Liaison [phone]

## 2023-02-27 NOTE — Consult Note (Signed)
Pharmacy Consult Note - Anticoagulation  Pharmacy Consult for IV Heparin Indication:  limb ischemia  PATIENT MEASUREMENTS: Height: 5\' 7"  (170.2 cm) Weight: 64.9 kg (143 lb) IBW/kg (Calculated) : 66.1 HEPARIN DW (KG): 64.9  Recent Labs    02/26/23 1922 02/27/23 0050 02/27/23 0641 02/27/23 0845  HGB  --   --  11.0*  --   HCT  --   --  32.3*  --   PLT  --   --  191  --   APTT 39*  --   --   --   LABPROT 15.6*  --   --   --   INR 1.2  --   --   --   HEPARINUNFRC  --    < >  --  0.38  CREATININE  --   --  1.00  --    < > = values in this interval not displayed.    Estimated Creatinine Clearance: 68.5 mL/min (by C-G formula based on SCr of 1 mg/dL).  PAST MEDICAL HISTORY: 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    GERD (gastroesophageal reflux disease)    Hepatitis    chronic hep c   Hydronephrosis    Hydronephrosis with ureteral stricture    Hyperlipidemia    Insomnia, unspecified    Knee pain    Left   Major depressive disorder, recurrent, moderate (HCC)    Malignant neoplasm of colon (HCC)    Malnutrition (HCC)    Muscle weakness (generalized)    Nerve pain    Other abnormalities of gait and mobility    Other chronic pain    Peripheral vascular disease (HCC)    Personal history of transient cerebral ischemia    Pressure ulcer    Prostate cancer (HCC)     Stroke (HCC)    Tinea unguium    Tobacco user    Unspecified protein-calorie malnutrition (HCC)    Ureteral cancer, right (HCC)    Urinary frequency    Venous hypertension of both lower extremities    Xerosis cutis     ASSESSMENT: 62 y.o. male with PMH PAD, chronic left foot ulceration is presenting with worsening ulceration and necrosis. Patient is not on chronic anticoagulation per chart review. Pharmacy has been consulted to initiate and manage heparin intravenous infusion.  Pertinent medications: No chronic AC PTA  Goal(s) of therapy: Heparin level 0.3 - 0.7 units/mL Monitor platelets by anticoagulation protocol: Yes   Baseline anticoagulation labs: Recent Labs    02/26/23 1537 02/26/23 1922 02/27/23 0641  APTT  --  39*  --   INR  --  1.2  --   HGB 12.8*  --  11.0*  PLT 196  --  191      PLAN: --Heparin level is  therapeutic x 1 --Continue heparin infusion at 1100 units/hr --Re-check confirmatory HL in 6 hours --Daily CBC per protocol while on IV heparin  Tressie Ellis 02/27/2023 10:01 AM

## 2023-02-27 NOTE — Consult Note (Addendum)
WOC Nurse Consult Note: Reason for Consult: Consult requested for wounds to bilat legs and colostomy.  Pt is familiar to [facility] team from a previous admission for colostomy surgery in April 2023.  He lives in a facility where he has total assistance with ostomy pouch application and emptying and does not assist.  He has been followed by the outpatient wound care center for his left foot wound and podiatry has been consulted so I did not assess that location.  Right heel chronic Unstageable pressure injur, slough eschar 2X2cm Left anterior great toe with dry scabbed area 1X1cm Right anterior calf with full thickness eschar; 5X5cm Topical treatment orders provided for bedside nurses to perform as follows: Apply Medihoney to right heel, right great toe, and left anterior calf Q day, then cover with foam dressing.  Change foam dressing Q 3 days or PRN soiling. Please refer to podiatry for further plan of care for left foot.   WOC Nurse ostomy follow up Stoma type/location: Colostomy pouch is intact with good seal, mod amt brown stool.  Extra supplies left in the room for staff nurse's use. Use Supplies: barrier ring [place] # [id number], wafer [place] # [phone], pouch Lawson # [phone] Please re-consult if further assistance is needed.  Thank-you,  Cammie Mcgee MSN, RN, CWOCN, [place], CNS [phone]

## 2023-02-27 NOTE — Progress Notes (Signed)
Initial Nutrition Assessment  DOCUMENTATION CODES:   Severe malnutrition in context of chronic illness  INTERVENTION:   Ensure Enlive po BID, each supplement provides 350 kcal and 20 grams of protein.  Pro-Source Plus 30ml PO BID- Each supplement provides 100kcal and 15g protein    Magic cup TID with meals, each supplement provides 290 kcal and 9 grams of protein  MVI po daily   Vitamin C 500mg  po BID  Liberalize diet   Pt at high refeed risk; recommend monitor potassium, magnesium and phosphorus labs daily until stable  Daily weights   NUTRITION DIAGNOSIS:   Severe Malnutrition related to cancer and cancer related treatments as evidenced by severe fat depletion, severe muscle depletion.  GOAL:   Patient will meet greater than or equal to 90% of their needs  MONITOR:   PO intake, Supplement acceptance, Labs, Weight trends, I & O's, Skin  REASON FOR ASSESSMENT:   Consult Assessment of nutrition requirement/status  ASSESSMENT:   62 y.o. 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  Met with pt in room today. Pt reports poor appetite and oral intake at baseline. Pt reports that he just does not feel like eating. Pt reports that he has lost a lot of weight but is unsure how much. Pt reports that he does drink supplements but he is not sure what kind. Pt reports that he did not eat much of his lunch today but pt does appear to have eaten a cup of chocolate ice cream which was empty on his side table. RD discussed with pt the importance of adequate nutrition needed to preserve lean muscle and to support wound healing. RD will add supplements and vitamins to help pt meet his estimated needs. Pt is at high refeed risk.  Per chart, pt appears to be down ~20lbs(12%) over the past 3 months; this is significant weight loss. Pt may require amputation. Palliative care consult pending.   Medications reviewed and include: Kcl, NaCl @50ml /hr,heparin, zosyn, vancomycin   Labs reviewed: K 3.2(L), BUN 24(H) Prealbumin 12(L)- 8/1  NUTRITION - FOCUSED PHYSICAL EXAM:  Flowsheet Row Most Recent Value  Orbital Region Moderate depletion  Upper Arm Region Severe depletion  Thoracic and Lumbar Region Severe depletion  Buccal Region Moderate depletion  Temple Region Moderate depletion  Clavicle Bone Region Severe depletion  Clavicle and Acromion Bone Region Severe depletion  Scapular Bone Region Moderate depletion  Dorsal Hand Severe depletion  Patellar Region Moderate depletion  Anterior Thigh Region Moderate depletion  Posterior Calf Region Moderate depletion  Edema (RD Assessment) Mild  Hair Reviewed  Eyes Reviewed  Mouth Reviewed  Skin Reviewed  Nails Reviewed   Diet Order:   Diet Order             Diet Heart Room service appropriate? Yes with Assist; Fluid consistency: Thin  Diet effective now                  EDUCATION NEEDS:   Education needs have been addressed  Skin:  Skin Assessment: Reviewed RN Assessment  Last BM:  PTA  Height:   Ht Readings from Last 1 Encounters:  02/26/23 5\' 7"  (1.702 m)    Weight:   Wt Readings from Last 1 Encounters:  02/26/23 64.9 kg    Ideal  Body Weight:  67 kg  BMI:  Body mass index is 22.4 kg/m.  Estimated Nutritional Needs:   Kcal:  1900-2200kcal/day  Protein:  95-110g/day  Fluid:  1.9-2.2L/day  Betsey Holiday MS, RD, LDN Please refer to [facility] for RD and/or RD on-call/weekend/after hours pager

## 2023-02-27 NOTE — Consult Note (Signed)
Pharmacy Consult Note - Anticoagulation  Pharmacy Consult for heparin Indication:  limb ischemia  PATIENT MEASUREMENTS: Height: 5\' 7"  (170.2 cm) Weight: 64.9 kg (143 lb) IBW/kg (Calculated) : 66.1 HEPARIN DW (KG): 64.9  VITAL SIGNS: BP: 114/82 (08/01 2031) Pulse Rate: 108 (08/01 2031)  Recent Labs    02/26/23 1537 02/26/23 1922 02/27/23 0050  HGB 12.8*  --   --   HCT 38.6*  --   --   PLT 196  --   --   APTT  --  39*  --   LABPROT  --  15.6*  --   INR  --  1.2  --   HEPARINUNFRC  --   --  0.22*  CREATININE 1.17  --   --     Estimated Creatinine Clearance: 58.6 mL/min (by C-G formula based on SCr of 1.17 mg/dL).  PAST MEDICAL HISTORY: 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    GERD (gastroesophageal reflux disease)    Hepatitis    chronic hep c   Hydronephrosis    Hydronephrosis with ureteral stricture    Hyperlipidemia    Insomnia, unspecified    Knee pain    Left   Major depressive disorder, recurrent, moderate (HCC)    Malignant neoplasm of colon (HCC)    Malnutrition (HCC)    Muscle weakness (generalized)    Nerve pain    Other abnormalities of gait and mobility    Other chronic pain    Peripheral vascular disease (HCC)    Personal history of transient cerebral ischemia    Pressure ulcer    Prostate cancer (HCC)    Stroke (HCC)    Tinea unguium     Tobacco user    Unspecified protein-calorie malnutrition (HCC)    Ureteral cancer, right (HCC)    Urinary frequency    Venous hypertension of both lower extremities    Xerosis cutis     ASSESSMENT: 62 y.o. male with PMH PAD, chronic left foot ulceration is presenting with worsening ulceration and necrosis. Patient is not on chronic anticoagulation per chart review. Pharmacy has been consulted to initiate and manage heparin intravenous infusion.  Pertinent medications: No chronic AC PTA  Goal(s) of therapy: Heparin level 0.3 - 0.7 units/mL Monitor platelets by anticoagulation protocol: Yes   Baseline anticoagulation labs: Recent Labs    02/26/23 1537 02/26/23 1922  APTT  --  39*  INR  --  1.2  HGB 12.8*  --   PLT 196  --     Date Time aPTT/HL Rate/Comment 8/2       0050    0.22                SUBtherapeutic @ 950 units/hr    PLAN: 8/2:  HL @  0050 = 0.22, SUBtherapeutic  - Will order heparin 1900 units IV X 1 bolus and increase drip rate to 1100 units/hr - Will recheck HL 6 hrs after rate change  Monitor CBC daily while on heparin infusion.  , D 02/27/2023 1:28 AM

## 2023-02-27 NOTE — Consult Note (Signed)
Hospital Consult    Reason for Consult:  Left lower extremity Wounds. Requesting Physician:  Dr Doree Albee MD MRN #:  [id number]  History of Present Illness: This is a 62 y.o. 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 Wyn Quaker on aspirin and Plavix, COPD, GERD, hepatitis C, hyperlipidemia, presenting with nonhealing left extremity wound, limb ischemia.  Patient followed by wound care, was evaluated in the wound clinic on 02/26/2023 with concerns for worsening left lower extremity wound.  She had an angiogram last month showing left anterior tibial and posterior tibial arteries were occluded without distal sensation.  Notes to the ER today for concerns of worsening left lower extremity wound.  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    GERD (gastroesophageal reflux disease)    Hepatitis    chronic hep c   Hydronephrosis    Hydronephrosis with ureteral stricture    Hyperlipidemia    Insomnia, unspecified    Knee pain    Left   Major depressive disorder, recurrent, moderate (HCC)    Malignant neoplasm of colon (HCC)    Malnutrition (HCC)    Muscle weakness (generalized)    Nerve pain    Other abnormalities of gait and mobility    Other  chronic pain    Peripheral vascular disease (HCC)    Personal history of transient cerebral ischemia    Pressure ulcer    Prostate cancer (HCC)    Stroke (HCC)    Tinea unguium    Tobacco user    Unspecified protein-calorie malnutrition (HCC)    Ureteral cancer, right (HCC)    Urinary frequency    Venous hypertension of both lower extremities    Xerosis cutis     Past Surgical History:  Procedure Laterality Date   COLON SURGERY     En bloc extended right hemicolectomy 07/2017   COLONOSCOPY WITH PROPOFOL N/A 11/06/2020   Procedure: COLONOSCOPY WITH PROPOFOL;  Surgeon: Wyline Mood, MD;  Location: [facility] ENDOSCOPY;  Service: Gastroenterology;  Laterality: N/A;   COLONOSCOPY WITH PROPOFOL N/A 07/31/2021   Procedure: COLONOSCOPY WITH PROPOFOL;  Surgeon: Earline Mayotte, MD;  Location: [facility] ENDOSCOPY;  Service: Endoscopy;  Laterality: N/A;   CYSTOSCOPY W/ RETROGRADES Right 08/30/2018   Procedure: CYSTOSCOPY WITH RETROGRADE PYELOGRAM;  Surgeon: Vanna Scotland, MD;  Location: [facility] ORS;  Service: Urology;  Laterality: Right;   CYSTOSCOPY WITH STENT PLACEMENT Right 04/25/2018   Procedure: CYSTOSCOPY WITH STENT PLACEMENT;  Surgeon: Vanna Scotland, MD;  Location: [facility] ORS;  Service: Urology;  Laterality: Right;   CYSTOSCOPY WITH STENT PLACEMENT Right 08/30/2018   Procedure: CYSTOSCOPY WITH STENT Exchange;  Surgeon: Vanna Scotland, MD;  Location: [facility] ORS;  Service: Urology;  Laterality: Right;   CYSTOSCOPY WITH STENT PLACEMENT Right 03/07/2019   Procedure: CYSTOSCOPY WITH STENT Exchange;  Surgeon: Vanna Scotland, MD;  Location: [facility] ORS;  Service: Urology;  Laterality: Right;   CYSTOSCOPY WITH STENT PLACEMENT Right 11/21/2019   Procedure: CYSTOSCOPY WITH STENT Exchange;  Surgeon: Vanna Scotland, MD;  Location: [facility] ORS;  Service: Urology;  Laterality: Right;   LOWER EXTREMITY ANGIOGRAPHY Left 05/23/2019   Procedure: LOWER EXTREMITY ANGIOGRAPHY;  Surgeon: Annice Needy, MD;  Location: [facility]  INVASIVE CV LAB;  Service: Cardiovascular;  Laterality: Left;   LOWER EXTREMITY ANGIOGRAPHY Right 05/30/2019   Procedure: LOWER EXTREMITY ANGIOGRAPHY;  Surgeon: Annice Needy, MD;  Location: [facility] INVASIVE CV LAB;  Service: Cardiovascular;  Laterality: Right;   LOWER EXTREMITY ANGIOGRAPHY Right 02/13/2020   Procedure: LOWER EXTREMITY ANGIOGRAPHY;  Surgeon: Annice Needy, MD;  Location: [facility] INVASIVE CV LAB;  Service: Cardiovascular;  Laterality: Right;   LOWER EXTREMITY ANGIOGRAPHY Left 02/20/2020   Procedure: LOWER EXTREMITY ANGIOGRAPHY;  Surgeon: Annice Needy, MD;  Location: [facility] INVASIVE CV LAB;  Service: Cardiovascular;  Laterality: Left;   LOWER EXTREMITY ANGIOGRAPHY Left 01/01/2023   Procedure: Lower Extremity Angiography;  Surgeon: Annice Needy, MD;  Location: [facility] INVASIVE CV LAB;  Service: Cardiovascular;  Laterality: Left;   PORTA CATH INSERTION N/A 02/28/2019   Procedure: PORTA CATH INSERTION;  Surgeon: Annice Needy, MD;  Location: [facility] INVASIVE CV LAB;  Service: Cardiovascular;  Laterality: N/A;   ROBOT ASSISTED LAPAROSCOPIC PARTIAL COLECTOMY  11/17/2022   tumor removed       Allergies  Allergen Reactions   Penicillins Rash    Prior to Admission medications   Medication Sig Start Date End Date Taking? Authorizing Provider  acetaminophen (TYLENOL) 325 MG tablet Take 650 mg by mouth 3 (three) times daily.     [provider]  Amino Acids-Protein Hydrolys (PRO-STAT) LIQD Take 45 mLs by mouth daily.    [provider]  aspirin EC 81 MG tablet Take 1 tablet (81 mg total) by mouth daily. 05/23/19   Annice Needy, MD  atorvastatin (LIPITOR) 10 MG tablet Take 1 tablet (10 mg total) by mouth daily. 05/23/19 01/01/23  Annice Needy, MD  Calcium Carb-Cholecalciferol (CALCIUM CARBONATE-VITAMIN D3 PO) Take 2 tablets by mouth daily. 600-400 mg    [provider]  clopidogrel (PLAVIX) 75 MG tablet Take 75 mg by mouth daily.    [provider]  cyclobenzaprine  (FEXMID) 7.5 MG tablet Take 15 mg by mouth at bedtime.    [provider]  escitalopram (LEXAPRO) 5 MG tablet Take 5 mg by mouth daily.    [provider]  ferrous fumarate (FERRETTS) 325 (106 Fe) MG TABS tablet Take 1 tablet by mouth.    [provider]  gabapentin (NEURONTIN) 100 MG capsule Take 100 mg by mouth 3 (three) times daily.    [provider]  mirtazapine (REMERON) 7.5 MG tablet Take 7.5 mg by mouth at bedtime.    [provider]  Multiple Vitamin (MULTIVITAMIN) tablet Take 1 tablet by mouth daily.    [provider]  oxycodone (OXY-IR) 5 MG capsule Take 10 mg by mouth every 6 (six) hours. 08/02/22   [provider]  Polyethyl Glycol-Propyl Glycol (SYSTANE) 0.4-0.3 % SOLN Apply to eye.    [provider]  senna (SENOKOT) 8.6 MG TABS tablet Take 1 tablet by mouth at bedtime.    [provider]  senna-docusate (SENOKOT-S) 8.6-50 MG tablet  Take 1 tablet by mouth daily.    [provider]  tamsulosin (FLOMAX) 0.4 MG CAPS capsule Take 1 capsule (0.4 mg total) by mouth daily after supper. 11/28/21   Earna Coder, MD  traMADol (ULTRAM) 50 MG tablet Take 1 tablet (50 mg total) by mouth every 6 (six) hours as needed. 11/20/22 11/20/23  Carolan Shiver, MD    Social History   Socioeconomic History   Marital status: Single    Spouse name: Not on file   Number of children: Not on file   Years of education: Not on file   Highest education level: Not on file  Occupational History   Not on file  Tobacco Use   Smoking status: Every Day    Current packs/day: 0.50    Types: Cigarettes   Smokeless tobacco: Never  Vaping Use   Vaping status: Never Used  Substance and Sexual Activity   Alcohol use: Not Currently   Drug use: Not Currently    Types: Marijuana, Methylphenidate    Comment: quit 1997-1998 ish   Sexual activity: Not Currently  Other Topics Concern   Not on file  Social History  Narrative    used to live [place]; moved  To [place]- end of April 2019; in Nursing home; 1pp/day; quit alcohol. 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        Family- dad- prostate ca [at 78y]; brother- 4 died of prostate cancer; brother- 58- no cancers [[place]]; sonThayer Ohm [[place]];[name]-32y prostate ca [facility] [place]]; daughter- 64 [[place]]; another daughter 75 [[place]/add[place]]. will refer genetics counseling. Given MSI- abnormal; highly suspicious of Lynch syndrome.  Patient's son Cristal Deer aware of high possible lynch syndrome.   Social Determinants of Health   Financial Resource Strain: Not on file  Food Insecurity: No Food Insecurity (02/26/2023)   Hunger Vital Sign    Worried About Running Out of Food in the Last Year: Never true    Ran Out of Food in the Last Year: Never true  Transportation Needs: No Transportation Needs (02/26/2023)   PRAPARE - Administrator, Civil Service (Medical): No    Lack of Transportation (Non-Medical): No  Physical Activity: Not on file  Stress: Not on file  Social Connections: Not on file  Intimate Partner Violence: Not At Risk (02/26/2023)   Humiliation, Afraid, Rape, and Kick questionnaire    Fear of Current or Ex-Partner: No    Emotionally Abused: No    Physically Abused: No    Sexually Abused: No     Family History  Problem Relation Age of Onset   Prostate cancer Father 36   Cancer Brother 37       unsure type   Cancer Paternal Uncle        unsure type   Cancer Maternal Grandmother        unsure type   Cancer Paternal Grandmother        unsure type   Kidney cancer Paternal Grandfather    Cancer Other        unsure types   Leukemia Son    Cancer Son        other cancers, possibly colon    ROS: Otherwise negative unless mentioned in HPI  Physical Examination  Vitals:   02/27/23 0626 02/27/23 0813  BP: 138/87 123/77  Pulse: 100 (!) 102  Resp:  18  Temp:  98 F (36.7 C)  SpO2: 100% 100%   Body mass index is 22.4 kg/m.  General:  WDWN in  NAD Gait: Not observed HENT: WNL, normocephalic Pulmonary: normal non-labored breathing, without Rales, rhonchi,  wheezing Cardiac: regular, without  Murmurs, rubs or gallops; without carotid bruits Abdomen: Bowel sounds throughout, soft, NT/ND, no masses Skin: without rashes Vascular Exam/Pulses: Left lower extremity without palpable pulses.  Right lower extremity with weak palpable pulses Extremities: with ischemic changes, without Gangrene , with cellulitis; with open wounds;  Musculoskeletal: no muscle wasting or atrophy  Neurologic: A&O X 3;  No focal weakness or paresthesias are detected; speech is fluent/normal Psychiatric:  The pt has Normal affect. Lymph:  Unremarkable  CBC    Component Value Date/Time   WBC 6.4 02/27/2023 0641   RBC 3.97 (L) 02/27/2023 0641   HGB 11.0 (L) 02/27/2023 0641   HCT 32.3 (L) 02/27/2023 0641   PLT 191 02/27/2023 0641   MCV 81.4 02/27/2023 0641   MCH 27.7 02/27/2023 0641   MCHC 34.1 02/27/2023 0641   RDW 14.8 02/27/2023 0641   LYMPHSABS 1.1 12/29/2022 1012   MONOABS 0.5 12/29/2022 1012   EOSABS 0.2 12/29/2022 1012   BASOSABS 0.1 12/29/2022 1012    BMET    Component Value Date/Time   NA 138 02/27/2023 0641   K 3.2 (L) 02/27/2023 0641   CL 109 02/27/2023 0641   CO2 19 (L) 02/27/2023 0641   GLUCOSE 105 (H) 02/27/2023 0641   BUN 24 (H) 02/27/2023 0641   CREATININE 1.00 02/27/2023 0641   CALCIUM 8.3 (L) 02/27/2023 0641   GFRNONAA >60 02/27/2023 0641   GFRAA >60 04/19/2020 0858    COAGS: Lab Results  Component Value Date   INR 1.2 02/26/2023   INR 1.01 06/17/2018   INR 1.11 06/08/2018     Non-Invasive Vascular Imaging:   MRI Left Foot In Process  Statin:  Yes.   Beta Blocker:  No. Aspirin:  Yes.   ACEI:  No. ARB:  No. CCB use:  No Other antiplatelets/anticoagulants:  Yes.   Plavix 75 mg Daily   ASSESSMENT/PLAN: This is a 62 y.o. male who is well-known to the vein and vascular.  Patient presents to [facility] emergency  department from the wound clinic with worsening left lower extremity wounds.  PLAN: He would like podiatry to evaluate the patient to determine if there is any salvageable surgery they may be able to do for this patient.  If unable to salvage any of the tissue in his left lower extremity the patient will need a below the knee amputation.  We will follow-up with podiatry after they have seen the patient to set up a below the knee amputation if patient so desires.  Surgery would be Tuesday or Wednesday of next week.   -I discussed the plan in detail with Dr. [name] MD and he is in agreement with the plan.   Marcie Bal [facility] 02/27/2023 9:52 AM

## 2023-02-28 DIAGNOSIS — S81802A Unspecified open wound, left lower leg, initial encounter: Secondary | ICD-10-CM | POA: Diagnosis not present

## 2023-02-28 MED ORDER — IBUPROFEN 400 MG PO TABS
400.0000 mg | ORAL_TABLET | Freq: Four times a day (QID) | ORAL | Status: DC | PRN
  Administered 2023-02-28 – 2023-03-06 (×14): 400 mg via ORAL
  Filled 2023-02-28 (×14): qty 1

## 2023-02-28 MED ORDER — VANCOMYCIN HCL 750 MG/150ML IV SOLN
750.0000 mg | Freq: Two times a day (BID) | INTRAVENOUS | Status: DC
  Administered 2023-02-28 – 2023-03-04 (×8): 750 mg via INTRAVENOUS
  Filled 2023-02-28 (×9): qty 150

## 2023-02-28 NOTE — Consult Note (Signed)
Pharmacy Antibiotic Note  ASSESSMENT: 62 y.o. male with PMH PAD, chronic foot ulceration is presenting with  wound infection and concern for osteomyelitis . Pharmacy has been consulted to manage Zosyn and vancomycin dosing.  [id number] PAD with multiple interventions. Plan for BKA per Vascular surgery note 02/27/23  Patient measurements: Height: 5\' 7"  (170.2 cm) Weight: 64.9 kg (143 lb) IBW/kg (Calculated) : 66.1  Vital signs: Temp: 98.4 F (36.9 C) (08/03 0427) Temp Source: Oral (08/03 0427) BP: 137/96 (08/03 0427) Pulse Rate: 96 (08/03 0427) Recent Labs  Lab 02/26/23 1537 02/27/23 0641 02/28/23 0549  WBC 8.0 6.4  --   CREATININE 1.17 1.00 0.82   Estimated Creatinine Clearance: 83.5 mL/min (by C-G formula based on SCr of 0.82 mg/dL).  Allergies: Allergies  Allergen Reactions   Penicillins Rash    Antimicrobials this admission: Zosyn 8/1 >>  Vancomycin 8/1 >>  Dose adjustments this admission: N/A  Microbiology results: 8/1 BCx: NG2d 8/3 MRSA PCR +  PLAN: Scr improved 1.17>>1.00>>0.82 continue Zosyn 3.375 IV q8H  Will adjust Vancomycin to 750 mg IV q12h Goal AUC 400-550  Est AUC:435; Cmin: 12.1 SCr 0.82; TBW; Vd 0.72  Follow up culture results to assess for antibiotic optimization. Monitor renal function to assess for any necessary antibiotic dosing changes. 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  Bari Mantis PharmD Clinical Pharmacist 02/28/2023

## 2023-02-28 NOTE — Consult Note (Signed)
Pharmacy Consult Note - Anticoagulation  Pharmacy Consult for IV Heparin Indication:  limb ischemia  PATIENT MEASUREMENTS: Height: 5\' 7"  (170.2 cm) Weight: 64.9 kg (143 lb) IBW/kg (Calculated) : 66.1 HEPARIN DW (KG): 64.9  Recent Labs    02/26/23 1922 02/27/23 0050 02/27/23 0641 02/27/23 0845 02/28/23 0549  HGB  --   --  11.0*  --   --   HCT  --   --  32.3*  --   --   PLT  --   --  191  --   --   APTT 39*  --   --   --   --   LABPROT 15.6*  --   --   --   --   INR 1.2  --   --   --   --   HEPARINUNFRC  --    < >  --    < > 0.42  CREATININE  --   --  1.00  --   --    < > = values in this interval not displayed.    Estimated Creatinine Clearance: 68.5 mL/min (by C-G formula based on SCr of 1 mg/dL).  PAST MEDICAL HISTORY: 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    GERD (gastroesophageal reflux disease)    Hepatitis    chronic hep c   Hydronephrosis    Hydronephrosis with ureteral stricture    Hyperlipidemia    Insomnia, unspecified    Knee pain    Left   Major depressive disorder, recurrent, moderate (HCC)    Malignant neoplasm of colon (HCC)    Malnutrition (HCC)    Muscle weakness (generalized)    Nerve pain    Other abnormalities of gait and mobility    Other chronic pain    Peripheral vascular disease (HCC)    Personal history of transient cerebral  ischemia    Pressure ulcer    Prostate cancer (HCC)    Stroke (HCC)    Tinea unguium    Tobacco user    Unspecified protein-calorie malnutrition (HCC)    Ureteral cancer, right (HCC)    Urinary frequency    Venous hypertension of both lower extremities    Xerosis cutis     ASSESSMENT: 62 y.o. male with PMH PAD, chronic left foot ulceration is presenting with worsening ulceration and necrosis. Patient is not on chronic anticoagulation per chart review. Pharmacy has been consulted to initiate and manage heparin intravenous infusion.  Pertinent medications: No chronic AC PTA  Goal(s) of therapy: Heparin level 0.3 - 0.7 units/mL Monitor platelets by anticoagulation protocol: Yes   Baseline anticoagulation labs: Recent Labs    02/26/23 1537 02/26/23 1922 02/27/23 0641  APTT  --  39*  --   INR  --  1.2  --   HGB 12.8*  --  11.0*  PLT 196  --  191      PLAN: --Heparin level is therapeutic x 3 --Continue heparin infusion at 1100 units/hr --Check heparin level with AM labs tomorrow --Daily CBC per protocol while on IV heparin  , D Clinical Pharmacist 02/28/2023 6:18 AM

## 2023-02-28 NOTE — Progress Notes (Signed)
Progress Note   Patient: Chad [name] ZOX:[id number] DOB: April 25, 1961 DOA: 02/26/2023     2 DOS: the patient was seen and examined on 02/28/2023   Brief hospital course:  Patient is a 62 y.o. 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 Wyn Quaker on aspirin and Plavix, COPD, GERD, hepatitis C, hyperlipidemia, who presented after being referred from clinic with nonhealing left extremity wound and concerns for limb ischemia. Records reviewed from 10/2022 suggest an arteriogram with near occlussion of posterior tibial arteries, Left ATA occluded but with distal reconstitution.  1/4 to 1/2 pack/day smoker.   8/3 : Patient seen by vascular surgery plan for left below the knee amputation on Tuesday/Wednesday.  In the meantime patient continues to be on  antibiotic.    Assessment and Plan: Non-healing wound of left lower extremity  Worsening left lower extremity chronic ulcer in the setting of chronic peripheral vascular disease. Patient is followed up by Dr. Wyn Quaker with vascular surgery as well as wound care Due to concern for limb ischemia, Vascular surgery re-consulted. Currently on heparin. Angiogram done in 12/2022.Case discussed with Dr. Gilda Crease with vascular surgery as well as Dr. Allena Katz with podiatry by admitting physician. Follow up doen today. They will see later today. Cont on heparin drip. On Zosyn and vancomycin for infectious coverage MRI of the foot with and without contrast .Findings as noted below. Possible amputation of the left foot may be warranted.Will defer to podiatry team  Chronic Tobacco dependence 1/2 pack/day smoker. Nicotine patch was offered. Tobacco cessation counselling also offered  History of colon cancer s/p hemicolectomy 10/2022 w/ Dr. Maia Plan  Peripheral vascular disease of lower extremity with ulceration (HCC) Followed by Dr. Wyn Quaker.  Vascular to see in house and advise further management. On heparin drip in the setting of concern for limb ischemia of the left lower extremity  Hypokalemia:  Potassium replacement protocol.Replete magnesium as appropriate  Mild NAGMA: Lactic acid was within normal limits on admission.  Chronic pain due to neoplasm Managed by palliative care Continue home oxycodone  Urothelial cancer (HCC) Baseline High-grade urothelial cancer followed by Dr. Donneta Romberg  Protein energy Malnutrition: Body mass index 22.4kg/m2. Nutritionist to evaluate and advise on treatment.Ensure TID  Subjective: No overnight events.  Patient has no active complaint at the time my evaluation.  Concerned about his amputation status.  Updated that it scheduled for next Tuesday/Wednesday.  Physical Exam: Vitals:   02/27/23 1544 02/27/23 1827 02/27/23 1931 02/28/23 0427  BP: 110/68  118/83 (!) 137/96  Pulse: (!) 101  96 96  Resp: 14 18 16 20   Temp:   98.1 F (36.7 C) 98.4 F (36.9 C)  TempSrc:   Oral Oral  SpO2: 91%  100% 99%  Weight:      Height:       Frail appearing gentleman , sitting up in bed.Lower extremitis hanging on side of bed.  HEENT: Neck supple.anicteric, CHEST: Clinically Diminished breath sounds WJX:BJYNWGN S1,S2 wnm  CNS: No focal deficits EXT: Bilateral lower extremity with ulcerations. Left foot with dressing in place. Unable to assess lower extremity pulses due to dressing. Data Reviewed:  MRI: IMPRESSION: 1. Severely motion degraded study. The degree of motion makes this study essentially nondiagnostic. Repeat study with sedation or alternative imaging such as CT (with contrast if possible) recommended. 2. Dorsal and medial subcutaneous edema without evidence of focal fluid collection. 3. T2 hyperintensity within the  flexor musculature which could be due to denervation or myositis. Possible trace extensor tendon tenosynovitis. 4. No evidence of osteomyelitis or septic joint. Family  Communication: No family at bedside at this time  Disposition: Status is: Inpatient Remains inpatient appropriate because: Lower extremity gangrene  Planned Discharge Destination:  TBD DVT prophylaxis: On heparin gtt Time spent: 35 minutes  Author: Kirstie Peri, MD 02/28/2023 11:56 AM  For on call review [email or web address].

## 2023-02-28 NOTE — Progress Notes (Signed)
Pt refuses to lay back in the bed to get his weight., also refuses to stand for weight

## 2023-03-01 DIAGNOSIS — S81802A Unspecified open wound, left lower leg, initial encounter: Secondary | ICD-10-CM | POA: Diagnosis not present

## 2023-03-01 LAB — COMPREHENSIVE METABOLIC PANEL WITH GFR
ALT: 18 U/L (ref 0–44)
AST: 39 U/L (ref 15–41)
Albumin: 2.6 g/dL — ABNORMAL LOW (ref 3.5–5.0)
Alkaline Phosphatase: 67 U/L (ref 38–126)
Anion gap: 5 (ref 5–15)
BUN: 15 mg/dL (ref 8–23)
CO2: 19 mmol/L — ABNORMAL LOW (ref 22–32)
Calcium: 7.9 mg/dL — ABNORMAL LOW (ref 8.9–10.3)
Chloride: 114 mmol/L — ABNORMAL HIGH (ref 98–111)
Creatinine, Ser: 0.85 mg/dL (ref 0.61–1.24)
GFR, Estimated: >60 mL/min (ref >60)
Glucose, Bld: 89 mg/dL (ref 70–99)
Potassium: 3.6 mmol/L (ref 3.5–5.1)
Sodium: 138 mmol/L (ref 135–145)
Total Bilirubin: 0.6 mg/dL (ref 0.3–1.2)
Total Protein: 6.7 g/dL (ref 6.5–8.1)

## 2023-03-01 LAB — CBC
HCT: 30.5 % — ABNORMAL LOW (ref 39.0–52.0)
Hemoglobin: 10.2 g/dL — ABNORMAL LOW (ref 13.0–17.0)
MCH: 27.4 pg (ref 26.0–34.0)
MCHC: 33.4 g/dL (ref 30.0–36.0)
MCV: 82 fL (ref 80.0–100.0)
Platelets: 177 10*3/uL (ref 150–400)
RBC: 3.72 MIL/uL — ABNORMAL LOW (ref 4.22–5.81)
RDW: 15.3 % (ref 11.5–15.5)
WBC: 4.6 10*3/uL (ref 4.0–10.5)
nRBC: 0 % (ref 0.0–0.2)

## 2023-03-01 LAB — HEPARIN LEVEL (UNFRACTIONATED): Heparin Unfractionated: 0.42 [IU]/mL (ref 0.30–0.70)

## 2023-03-01 NOTE — Progress Notes (Signed)
  Progress Note   Patient: Chad [name] AOZ:[id number] DOB: [birth year] DOA: 02/26/2023     3 DOS: the patient was seen and examined on 03/01/2023     Subjective:  Patient seen and examined at bedside this morning Patient has been seen by vascular surgeon as well as podiatrist Given extensive vascular disease vascular surgeon is planning amputation of the left below-knee on Tuesday or Wednesday   Brief hospital course: Patient is a 62 y.o. 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 Wyn Quaker on aspirin and Plavix, COPD, GERD, hepatitis C, hyperlipidemia, who presented after being referred from clinic with nonhealing left extremity wound and concerns for limb ischemia. Records reviewed from 10/2022 suggest an arteriogram with near occlussion of posterior tibial arteries, Left ATA occluded but with distal reconstitution.  1/4 to 1/2 pack/day smoker. Patient seen by vascular surgery plan for left below the knee amputation on Tuesday/Wednesday.  In the meantime patient continues to be on  antibiotic.   Assessment and Plan:  Non-healing wound of left lower extremity   Worsening left lower extremity chronic ulcer in the setting of chronic peripheral vascular disease. Patient is followed up by Dr. Wyn Quaker with vascular surgery as well as wound care Due to concern for limb ischemia, Vascular surgery re-consulted.  Currently on heparin Angiogram showed 2024 Given extensive vascular disease vascular surgeon is planning amputation of the left below-knee on Tuesday or Wednesday Case discussed with Dr. Gilda Crease with vascular surgery  Continue Zosyn and vancomycin for infectious coverage    Chronic Tobacco dependence 1/2 pack/day smoker. Continue nicotine patch Counseled on smoking cessation   History of colon cancer s/p hemicolectomy 10/2022 w/ Dr. Maia Plan    Peripheral vascular disease of lower extremity with ulceration (HCC) Followed by Dr. Wyn Quaker. Vascular to see in house and advise further management. Continue heparin Being planned for left below-knee amputation Tuesday or Wednesday   Hypokalemia:  Potassium replacement protocol.Replete magnesium as appropriate   Mild NAGMA: Lactic acid was within normal limits on admission.   Chronic pain due to neoplasm Managed by palliative care Continue oxycodone   Urothelial cancer (HCC) Baseline High-grade urothelial cancer followed by Dr. Donneta Romberg Outpatient follow-up with oncologist  Protein energy Malnutrition: Body mass index 22.4kg/m2. Nutritionist to evaluate and advise on treatment.Ensure TID   DVT prophylaxis: Continue heparin  Physical Exam: Patient is frail sitting up in bed HEENT: Neck supple.anicteric, CHEST: Clinically Diminished breath sounds NGE:[id number] S1,S2 wnm  CNS: No focal deficits EXT: Dressing noted to the left lower extremity with surrounding bilateral lower extremity edema   Data Reviewed: I have reviewed vascular surgeon, podiatrist as well as previous hospitalist documentation.  I have also reviewed nursing documentation, vitals, labs   Family Communication: None present at bedside   Vitals:   02/28/23 2015 03/01/23 0404 03/01/23 0711 03/01/23 0840  BP: 119/76 (!) 156/97  (!) 142/92  Pulse: 92 82  83  Resp: 18 16  18   Temp: 98.5 F (36.9 C) 97.7 F (36.5 C)  98.4 F (36.9 C)  TempSrc: Oral Oral    SpO2: 100% 100%  100%  Weight:   64.9 kg   Height:         Author: Loyce Dys, MD 03/01/2023 3:40 PM  For on call review [email or web address].

## 2023-03-01 NOTE — Consult Note (Signed)
Pharmacy Consult Note - Anticoagulation  Pharmacy Consult for IV Heparin Indication:  limb ischemia  PATIENT MEASUREMENTS: Height: 5\' 7"  (170.2 cm) Weight: 64.9 kg (143 lb) IBW/kg (Calculated) : 66.1 HEPARIN DW (KG): 64.9  Recent Labs    02/26/23 1922 02/27/23 0050 03/01/23 0433  HGB  --    < > 10.2*  HCT  --    < > 30.5*  PLT  --    < > 177  APTT 39*  --   --   LABPROT 15.6*  --   --   INR 1.2  --   --   HEPARINUNFRC  --    < > 0.42  CREATININE  --    < > 0.85   < > = values in this interval not displayed.    Estimated Creatinine Clearance: 80.6 mL/min (by C-G formula based on SCr of 0.85 mg/dL).  PAST MEDICAL HISTORY: 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    GERD (gastroesophageal reflux disease)    Hepatitis    chronic hep c   Hydronephrosis    Hydronephrosis with ureteral stricture    Hyperlipidemia    Insomnia, unspecified    Knee pain    Left   Major depressive disorder, recurrent, moderate (HCC)    Malignant neoplasm of colon (HCC)    Malnutrition (HCC)    Muscle weakness (generalized)    Nerve pain    Other abnormalities of gait and mobility    Other chronic pain    Peripheral vascular disease (HCC)    Personal history of transient cerebral ischemia    Pressure ulcer    Prostate cancer (HCC)    Stroke (HCC)    Tinea unguium    Tobacco user     Unspecified protein-calorie malnutrition (HCC)    Ureteral cancer, right (HCC)    Urinary frequency    Venous hypertension of both lower extremities    Xerosis cutis     ASSESSMENT: 62 y.o. male with PMH PAD, chronic left foot ulceration is presenting with worsening ulceration and necrosis. Patient is not on chronic anticoagulation per chart review. Pharmacy has been consulted to initiate and manage heparin intravenous infusion.  Pertinent medications: No chronic AC PTA  Goal(s) of therapy: Heparin level 0.3 - 0.7 units/mL Monitor platelets by anticoagulation protocol: Yes   Baseline anticoagulation labs: Recent Labs    02/26/23 1537 02/26/23 1922 02/27/23 0641 03/01/23 0433  APTT  --  39*  --   --   INR  --  1.2  --   --   HGB 12.8*  --  11.0* 10.2*  PLT 196  --  191 177      PLAN: --Heparin level is therapeutic x 4 --Continue heparin infusion at 1100 units/hr --Check heparin level  with AM labs tomorrow --Daily CBC per protocol while on IV heparin  , D Clinical Pharmacist 03/01/2023 5:00 AM

## 2023-03-02 ENCOUNTER — Inpatient Hospital Stay: Payer: Medicaid Other

## 2023-03-02 ENCOUNTER — Inpatient Hospital Stay: Payer: Medicaid Other | Admitting: Internal Medicine

## 2023-03-02 DIAGNOSIS — S81802A Unspecified open wound, left lower leg, initial encounter: Secondary | ICD-10-CM | POA: Diagnosis not present

## 2023-03-02 MED ORDER — MUPIROCIN 2 % EX OINT
1.0000 | TOPICAL_OINTMENT | Freq: Two times a day (BID) | CUTANEOUS | Status: DC
  Administered 2023-03-02 – 2023-03-06 (×4): 1 via NASAL
  Filled 2023-03-02: qty 22

## 2023-03-02 NOTE — Consult Note (Signed)
Pharmacy Consult Note - Anticoagulation  Pharmacy Consult for IV Heparin Indication:  limb ischemia  PATIENT MEASUREMENTS: Height: 5\' 7"  (170.2 cm) Weight:  (pt refused) IBW/kg (Calculated) : 66.1 HEPARIN DW (KG): 64.9  Recent Labs    03/02/23 0450  HGB 10.1*  HCT 30.6*  PLT 169  HEPARINUNFRC 0.39  CREATININE 0.74    Estimated Creatinine Clearance: 85.6 mL/min (by C-G formula based on SCr of 0.74 mg/dL).  PAST MEDICAL HISTORY: 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    GERD (gastroesophageal reflux disease)    Hepatitis    chronic hep c   Hydronephrosis    Hydronephrosis with ureteral stricture    Hyperlipidemia    Insomnia, unspecified    Knee pain    Left   Major depressive disorder, recurrent, moderate (HCC)    Malignant neoplasm of colon (HCC)    Malnutrition (HCC)    Muscle weakness (generalized)    Nerve pain    Other abnormalities of gait and mobility    Other chronic pain    Peripheral vascular disease (HCC)    Personal history of transient cerebral ischemia    Pressure ulcer    Prostate cancer (HCC)    Stroke (HCC)    Tinea unguium    Tobacco user    Unspecified protein-calorie malnutrition (HCC)    Ureteral cancer, right (HCC)    Urinary frequency    Venous hypertension of both lower extremities    Xerosis cutis     ASSESSMENT: 62 y.o.  male with PMH PAD, chronic left foot ulceration is presenting with worsening ulceration and necrosis. Patient is not on chronic anticoagulation per chart review. Pharmacy has been consulted to initiate and manage heparin intravenous infusion.  Pertinent medications: No chronic AC PTA  Goal(s) of therapy: Heparin level 0.3 - 0.7 units/mL Monitor platelets by anticoagulation protocol: Yes   Baseline anticoagulation labs: Recent Labs    02/27/23 0641 03/01/23 0433 03/02/23 0450  HGB 11.0* 10.2* 10.1*  PLT 191 177 169      PLAN: --Heparin level is therapeutic x 5 --Continue heparin infusion at 1100 units/hr --Check heparin level with AM labs tomorrow --Daily CBC per protocol while on IV heparin  , D Clinical Pharmacist 03/02/2023 5:27 AM

## 2023-03-02 NOTE — Progress Notes (Signed)
Pt refuses to lay back in the bed to get a weight, also refuses to have male purewick changed. States you are not going to do it.

## 2023-03-02 NOTE — Plan of Care (Signed)
  Problem: Clinical Measurements: Goal: Will remain free from infection Outcome: Progressing   Problem: Activity: Goal: Risk for activity intolerance will decrease Outcome: Progressing   Problem: Nutrition: Goal: Adequate nutrition will be maintained Outcome: Progressing   Problem: Coping: Goal: Level of anxiety will decrease Outcome: Progressing   

## 2023-03-02 NOTE — H&P (View-Only) (Signed)
  Progress Note    03/02/2023 10:58 AM * No surgery date entered *  Subjective:  Chad [name] is a 62 yo male  with past medical history of tobacco abuse, urethral cancer and colon cancer and PVD seen at beside today for worsening left foot wound sent by wound care. Patient with significant PAD history and interventions. Patient relates the left foot wounds were worsening and advised to come to the hospital for amputation. Patient relates he wants to amputate and no longer have trouble with wounds. Denies n/v/f/c.     Vitals:   03/02/23 0445 03/02/23 0855  BP: (!) 131/94 139/85  Pulse: 75 88  Resp: 16 18  Temp: 97.7 F (36.5 C) 98.2 F (36.8 C)  SpO2: 98% 100%   Physical Exam: Cardiac:  RRR, Normal S1, S2. No murmurs  Lungs:  Clear on Auscultation throughout, No rales, rhonchi or wheezing.  Incisions:  None Extremities:  Ulceration noed to second through fifth digits and tracking to the dorsum of the foot with necrotic base. No erythema or edema present. No purulence. No  probe to bone.  Abdomen:  Positive bowel sounds, soft, non tender and non distended. Neurologic: AAOX3 follows commands and answers questions appropriately.   CBC    Component Value Date/Time   WBC 4.5 03/02/2023 0450   RBC 3.73 (L) 03/02/2023 0450   HGB 10.1 (L) 03/02/2023 0450   HCT 30.6 (L) 03/02/2023 0450   PLT 169 03/02/2023 0450   MCV 82.0 03/02/2023 0450   MCH 27.1 03/02/2023 0450   MCHC 33.0 03/02/2023 0450   RDW 15.3 03/02/2023 0450   LYMPHSABS 1.1 12/29/2022 1012   MONOABS 0.5 12/29/2022 1012   EOSABS 0.2 12/29/2022 1012   BASOSABS 0.1 12/29/2022 1012    BMET    Component Value Date/Time   NA 137 03/02/2023 0450   K 3.5 03/02/2023 0450   CL 111 03/02/2023 0450   CO2 20 (L) 03/02/2023 0450   GLUCOSE 83 03/02/2023 0450   BUN 12 03/02/2023 0450   CREATININE 0.74 03/02/2023 0450   CALCIUM 8.1 (L) 03/02/2023 0450   GFRNONAA >60 03/02/2023 0450   GFRAA >60 04/19/2020 0858    INR     Component Value Date/Time   INR 1.2 02/26/2023 1922     Intake/Output Summary (Last 24 hours) at 03/02/2023 1058 Last data filed at 03/02/2023 1038 Gross per 24 hour  Intake 780 ml  Output --  Net 780 ml     Assessment/Plan:  62 y.o. male is s/p Ulceration noed to second through fifth digits and tracking to the dorsum of the foot with necrotic base. * No surgery date entered *   PLAN: Vascular Surgery plans on taking the patient to the operating room tomorrow 03/03/2023 for a Left Below the Amputation. I discussed the operation in detail with the patient. He verbalizes his understanding and wishes to proceed. I answered all the patients questions. Patient will be made NPO after midnight for the procedure.   DVT prophylaxis:  Heparin Infusion   Marcie Bal [facility] 03/02/2023 10:58 AM

## 2023-03-02 NOTE — Progress Notes (Signed)
Pt refuses to have purewick changed

## 2023-03-02 NOTE — Progress Notes (Signed)
  Progress Note    03/02/2023 10:58 AM * No surgery date entered *  Subjective:  Chad [name] is a 62 yo male  with past medical history of tobacco abuse, urethral cancer and colon cancer and PVD seen at beside today for worsening left foot wound sent by wound care. Patient with significant PAD history and interventions. Patient relates the left foot wounds were worsening and advised to come to the hospital for amputation. Patient relates he wants to amputate and no longer have trouble with wounds. Denies n/v/f/c.     Vitals:   03/02/23 0445 03/02/23 0855  BP: (!) 131/94 139/85  Pulse: 75 88  Resp: 16 18  Temp: 97.7 F (36.5 C) 98.2 F (36.8 C)  SpO2: 98% 100%   Physical Exam: Cardiac:  RRR, Normal S1, S2. No murmurs  Lungs:  Clear on Auscultation throughout, No rales, rhonchi or wheezing.  Incisions:  None Extremities:  Ulceration noed to second through fifth digits and tracking to the dorsum of the foot with necrotic base. No erythema or edema present. No purulence. No  probe to bone.  Abdomen:  Positive bowel sounds, soft, non tender and non distended. Neurologic: AAOX3 follows commands and answers questions appropriately.   CBC    Component Value Date/Time   WBC 4.5 03/02/2023 0450   RBC 3.73 (L) 03/02/2023 0450   HGB 10.1 (L) 03/02/2023 0450   HCT 30.6 (L) 03/02/2023 0450   PLT 169 03/02/2023 0450   MCV 82.0 03/02/2023 0450   MCH 27.1 03/02/2023 0450   MCHC 33.0 03/02/2023 0450   RDW 15.3 03/02/2023 0450   LYMPHSABS 1.1 12/29/2022 1012   MONOABS 0.5 12/29/2022 1012   EOSABS 0.2 12/29/2022 1012   BASOSABS 0.1 12/29/2022 1012    BMET    Component Value Date/Time   NA 137 03/02/2023 0450   K 3.5 03/02/2023 0450   CL 111 03/02/2023 0450   CO2 20 (L) 03/02/2023 0450   GLUCOSE 83 03/02/2023 0450   BUN 12 03/02/2023 0450   CREATININE 0.74 03/02/2023 0450   CALCIUM 8.1 (L) 03/02/2023 0450   GFRNONAA >60 03/02/2023 0450   GFRAA >60 04/19/2020 0858    INR     Component Value Date/Time   INR 1.2 02/26/2023 1922     Intake/Output Summary (Last 24 hours) at 03/02/2023 1058 Last data filed at 03/02/2023 1038 Gross per 24 hour  Intake 780 ml  Output --  Net 780 ml     Assessment/Plan:  62 y.o. male is s/p Ulceration noed to second through fifth digits and tracking to the dorsum of the foot with necrotic base. * No surgery date entered *   PLAN: Vascular Surgery plans on taking the patient to the operating room tomorrow 03/03/2023 for a Left Below the Amputation. I discussed the operation in detail with the patient. He verbalizes his understanding and wishes to proceed. I answered all the patients questions. Patient will be made NPO after midnight for the procedure.   DVT prophylaxis:  Heparin Infusion   Marcie [facility] 03/02/2023 10:58 AM

## 2023-03-02 NOTE — Progress Notes (Signed)
Progress Note   Patient: Chad [name] UJW:[id number] DOB: [birth year] DOA: 02/26/2023     4 DOS: the patient was seen and examined on 03/02/2023         Subjective:  Patient seen and examined at bedside this morning He appears somewhat down today as he is being planned for amputation of his left lower limb. Denies nausea vomiting abdominal pain chest pain or cough   Brief hospital course: Patient is a 62 y.o. 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 Wyn Quaker on aspirin and Plavix, COPD, GERD, hepatitis C, hyperlipidemia, who presented after being referred from clinic with nonhealing left extremity wound and concerns for limb ischemia. Records reviewed from 10/2022 suggest an arteriogram with near occlussion of posterior tibial arteries, Left ATA occluded but with distal reconstitution.  1/4 to 1/2 pack/day smoker. Patient seen by vascular surgery plan for left below the knee amputation on Tuesday/Wednesday.  In the meantime patient continues to be on  antibiotic.    Assessment and Plan:   Non-healing wound of left lower extremity   Worsening left lower extremity chronic ulcer in the setting of chronic peripheral vascular disease. Patient is followed up by Dr. Wyn Quaker with vascular surgery as well as wound care Due to concern for limb ischemia, Vascular surgery re-consulted.  Currently on heparin Angiogram showed 2024 Given extensive vascular disease vascular surgeon is planning amputation of the left below-knee on Tuesday or Wednesday Case discussed with Dr. Gilda Crease with vascular surgery  Continue Zosyn and vancomycin for infectious coverage Patient was seen by vascular surgery today and is being planned for amputation of the left lower limb below the knee. Patient being kept for n.p.o. after midnight   Chronic Tobacco dependence 1/2 pack/day  smoker. Continue nicotine patch Counseled on smoking cessation   History of colon cancer s/p hemicolectomy 10/2022 w/ Dr. Maia Plan   Peripheral vascular disease of lower extremity with ulceration (HCC) Followed by Dr. Wyn Quaker. Vascular to see in house and advise further management. Continue heparin Being planned for left below-knee amputation Tuesday or Wednesday   Hypokalemia:  Potassium replacement protocol.Replete magnesium as appropriate   Mild NAGMA: Lactic acid was within normal limits on admission.   Chronic pain due to neoplasm Managed by palliative care Continue outpatient follow-up  Urothelial cancer (HCC) Baseline High-grade urothelial cancer followed by Dr. Donneta Romberg Outpatient follow-up with oncologist   Protein energy Malnutrition: Body mass index 22.4kg/m2. Nutritionist to evaluate and advise on treatment.Ensure TID   DVT prophylaxis: Continue heparin   Physical Exam: Patient is frail sitting up in bed HEENT: Neck supple.anicteric, CHEST: Clinically Diminished breath sounds FAO:[id number] S1,S2 wnm  CNS: No focal deficits EXT: Patient noted to the left lower extremity with surrounding edema     Data Reviewed: I have reviewed patient's labs, including CBC, BMP, glucose level, nursing documentation, podiatry documentation as well as vascular surgery's team's documentation   Family Communication: None present at bedside       Vitals:   03/01/23 1738 03/01/23 2058 03/02/23 0445 03/02/23 0855  BP: (!) 129/94 (!) 142/87 (!) 131/94 139/85  Pulse: 76 82 75 88  Resp: 18 16 16 18   Temp: 98 F (36.7 C) 98.2 F (36.8 C) 97.7 F (36.5 C) 98.2 F (36.8 C)  TempSrc:  Oral Oral   SpO2: 100% 99% 98% 100%  Weight:      Height:  Author: Loyce Dys, MD 03/02/2023 4:48 PM  For on call review [email or web address].

## 2023-03-02 NOTE — Plan of Care (Signed)

## 2023-03-02 NOTE — Progress Notes (Signed)
Patient refused all medication except heparin. Dr. Meriam Sprague notified. Patient refused dressing change. Said "leg is getting cut off anyway".

## 2023-03-03 ENCOUNTER — Inpatient Hospital Stay: Payer: Medicaid Other | Admitting: Anesthesiology

## 2023-03-03 ENCOUNTER — Other Ambulatory Visit: Payer: Self-pay

## 2023-03-03 ENCOUNTER — Encounter
Admission: EM | Disposition: A | Discharge status: Skilled Nursing Facility | Payer: Self-pay | Source: Ambulatory Visit | Attending: Internal Medicine

## 2023-03-03 DIAGNOSIS — S81802A Unspecified open wound, left lower leg, initial encounter: Secondary | ICD-10-CM | POA: Diagnosis not present

## 2023-03-03 DIAGNOSIS — I96 Gangrene, not elsewhere classified: Secondary | ICD-10-CM | POA: Diagnosis not present

## 2023-03-03 HISTORY — PX: AMPUTATION: SHX166

## 2023-03-03 SURGERY — AMPUTATION BELOW KNEE
Anesthesia: General | Site: Knee | Laterality: Left

## 2023-03-03 MED ORDER — MIDAZOLAM HCL 2 MG/2ML IJ SOLN
INTRAMUSCULAR | Status: DC | PRN
  Administered 2023-03-03: 2 mg via INTRAVENOUS

## 2023-03-03 MED ORDER — SODIUM CHLORIDE FLUSH 0.9 % IV SOLN
INTRAVENOUS | Status: DC | PRN
  Administered 2023-03-03: 100 mL

## 2023-03-03 MED ORDER — PROPOFOL 10 MG/ML IV BOLUS
INTRAVENOUS | Status: AC
  Filled 2023-03-03: qty 20

## 2023-03-03 MED ORDER — SODIUM CHLORIDE (PF) 0.9 % IJ SOLN
INTRAMUSCULAR | Status: AC
  Filled 2023-03-03: qty 50

## 2023-03-03 MED ORDER — HYDROMORPHONE HCL 1 MG/ML IJ SOLN
1.0000 mg | INTRAMUSCULAR | Status: DC | PRN
  Administered 2023-03-04 – 2023-03-06 (×12): 1 mg via INTRAVENOUS
  Filled 2023-03-03 (×13): qty 1

## 2023-03-03 MED ORDER — BUPIVACAINE LIPOSOME 1.3 % IJ SUSP
INTRAMUSCULAR | Status: AC
  Filled 2023-03-03: qty 20

## 2023-03-03 MED ORDER — PHENYLEPHRINE HCL (PRESSORS) 10 MG/ML IV SOLN
INTRAVENOUS | Status: DC | PRN
  Administered 2023-03-03 (×3): 80 ug via INTRAVENOUS
  Administered 2023-03-03 (×3): 160 ug via INTRAVENOUS
  Administered 2023-03-03: 80 ug via INTRAVENOUS

## 2023-03-03 MED ORDER — HYDROMORPHONE HCL 1 MG/ML IJ SOLN
INTRAMUSCULAR | Status: DC | PRN
  Administered 2023-03-03 (×2): .5 mg via INTRAVENOUS

## 2023-03-03 MED ORDER — OXYMETAZOLINE HCL 0.05 % NA SOLN
1.0000 | Freq: Two times a day (BID) | NASAL | Status: DC | PRN
  Filled 2023-03-03: qty 15

## 2023-03-03 MED ORDER — PROPOFOL 10 MG/ML IV BOLUS
INTRAVENOUS | Status: DC | PRN
Start: 2023-03-03 — End: 2023-03-03
  Administered 2023-03-03: 100 mg via INTRAVENOUS
  Administered 2023-03-03: 20 mg via INTRAVENOUS
  Administered 2023-03-03: 100 mg via INTRAVENOUS
  Administered 2023-03-03: 30 mg via INTRAVENOUS
  Administered 2023-03-03: 50 mg via INTRAVENOUS
  Administered 2023-03-03: 100 mg via INTRAVENOUS

## 2023-03-03 MED ORDER — LACTATED RINGERS IV SOLN: INTRAVENOUS | Status: DC | PRN

## 2023-03-03 MED ORDER — KETAMINE HCL 50 MG/5ML IJ SOSY
PREFILLED_SYRINGE | INTRAMUSCULAR | Status: AC
  Filled 2023-03-03: qty 5

## 2023-03-03 MED ORDER — HYDROMORPHONE HCL 1 MG/ML IJ SOLN
INTRAMUSCULAR | Status: AC
  Filled 2023-03-03: qty 1

## 2023-03-03 MED ORDER — KETAMINE HCL 10 MG/ML IJ SOLN
INTRAMUSCULAR | Status: DC | PRN
  Administered 2023-03-03: 30 mg via INTRAVENOUS
  Administered 2023-03-03 (×2): 20 mg via INTRAVENOUS
  Administered 2023-03-03: 10 mg via INTRAVENOUS
  Administered 2023-03-03: 20 mg via INTRAVENOUS

## 2023-03-03 MED ORDER — BUPIVACAINE HCL (PF) 0.5 % IJ SOLN
INTRAMUSCULAR | Status: AC
  Filled 2023-03-03: qty 30

## 2023-03-03 MED ORDER — FENTANYL CITRATE (PF) 100 MCG/2ML IJ SOLN
INTRAMUSCULAR | Status: AC
  Filled 2023-03-03: qty 2

## 2023-03-03 MED ORDER — FENTANYL CITRATE (PF) 100 MCG/2ML IJ SOLN
INTRAMUSCULAR | Status: DC | PRN
  Administered 2023-03-03 (×2): 50 ug via INTRAVENOUS

## 2023-03-03 MED ORDER — MIDAZOLAM HCL 2 MG/2ML IJ SOLN
INTRAMUSCULAR | Status: AC
  Filled 2023-03-03: qty 2

## 2023-03-03 MED ORDER — 0.9 % SODIUM CHLORIDE (POUR BTL) OPTIME
TOPICAL | Status: DC | PRN
  Administered 2023-03-03: 500 mL

## 2023-03-03 MED ORDER — ACETAMINOPHEN 10 MG/ML IV SOLN
INTRAVENOUS | Status: AC
  Filled 2023-03-03: qty 100

## 2023-03-03 MED ORDER — ONDANSETRON HCL 4 MG/2ML IJ SOLN
INTRAMUSCULAR | Status: DC | PRN
  Administered 2023-03-03: 4 mg via INTRAVENOUS

## 2023-03-03 MED ORDER — OXYCODONE HCL 5 MG/5ML PO SOLN: 5.0000 mg | Freq: Once | ORAL | Status: DC | PRN

## 2023-03-03 MED ORDER — LIDOCAINE HCL (CARDIAC) PF 100 MG/5ML IV SOSY
PREFILLED_SYRINGE | INTRAVENOUS | Status: DC | PRN
  Administered 2023-03-03: 60 mg via INTRAVENOUS

## 2023-03-03 MED ORDER — PHENYLEPHRINE HCL-NACL 20-0.9 MG/250ML-% IV SOLN
INTRAVENOUS | Status: AC
  Filled 2023-03-03: qty 250

## 2023-03-03 MED ORDER — FENTANYL CITRATE (PF) 100 MCG/2ML IJ SOLN
25.0000 ug | INTRAMUSCULAR | Status: DC | PRN
  Administered 2023-03-03 (×4): 25 ug via INTRAVENOUS

## 2023-03-03 MED ORDER — OXYCODONE HCL 5 MG PO TABS: 5.0000 mg | ORAL_TABLET | Freq: Once | ORAL | Status: DC | PRN

## 2023-03-03 MED ORDER — PIPERACILLIN-TAZOBACTAM 3.375 G IVPB
INTRAVENOUS | Status: AC
  Filled 2023-03-03: qty 50

## 2023-03-03 MED ORDER — ACETAMINOPHEN 10 MG/ML IV SOLN
INTRAVENOUS | Status: DC | PRN
  Administered 2023-03-03: 1000 mg via INTRAVENOUS

## 2023-03-03 SURGICAL SUPPLY — 62 items
ADH SKN CLS APL DERMABOND .7 (GAUZE/BANDAGES/DRESSINGS) ×2
APL PRP STRL LF DISP 70% ISPRP (MISCELLANEOUS) ×1
BAG COUNTER SPONGE SURGICOUNT (BAG) ×1 IMPLANT
BAG SPNG CNTER NS LX DISP (BAG) ×1
BLADE SAGITTAL WIDE XTHICK NO (BLADE) ×1 IMPLANT
BLADE SURG SZ10 CARB STEEL (BLADE) ×1 IMPLANT
BNDG CMPR 5X4 CHSV STRCH STRL (GAUZE/BANDAGES/DRESSINGS) ×1
BNDG CMPR STD VLCR NS LF 5.8X4 (GAUZE/BANDAGES/DRESSINGS) ×1
BNDG COHESIVE 4X5 TAN STRL LF (GAUZE/BANDAGES/DRESSINGS) ×1 IMPLANT
BNDG ELASTIC 4X5.8 VLCR NS LF (GAUZE/BANDAGES/DRESSINGS) ×1 IMPLANT
BNDG GAUZE DERMACEA FLUFF 4 (GAUZE/BANDAGES/DRESSINGS) ×1 IMPLANT
BNDG GZE DERMACEA 4 6PLY (GAUZE/BANDAGES/DRESSINGS) ×1
BRUSH SCRUB EZ 4% CHG (MISCELLANEOUS) ×1 IMPLANT
CANISTER WOUND CARE 500ML ATS (WOUND CARE) IMPLANT
CHLORAPREP W/TINT 26 (MISCELLANEOUS) ×1 IMPLANT
DERMABOND ADVANCED .7 DNX12 (GAUZE/BANDAGES/DRESSINGS) ×2 IMPLANT
DRAPE INCISE IOBAN 66X45 STRL (DRAPES) ×1 IMPLANT
DRSG GAUZE FLUFF 36X18 (GAUZE/BANDAGES/DRESSINGS) ×1 IMPLANT
DRSG VAC GRANUFOAM MED (GAUZE/BANDAGES/DRESSINGS) IMPLANT
DRSG XEROFORM 1X8 (GAUZE/BANDAGES/DRESSINGS) IMPLANT
ELECT CAUTERY BLADE 6.4 (BLADE) ×1 IMPLANT
ELECT REM PT RETURN 9FT ADLT (ELECTROSURGICAL) ×1
ELECTRODE REM PT RTRN 9FT ADLT (ELECTROSURGICAL) ×1 IMPLANT
GLOVE BIO SURGEON STRL SZ7 (GLOVE) ×2 IMPLANT
GLOVE INDICATOR 7.5 STRL GRN (GLOVE) ×1 IMPLANT
GLOVE SURG SYN 8.0 (GLOVE) ×1 IMPLANT
GLOVE SURG SYN 8.0 PF PI (GLOVE) ×1 IMPLANT
GOWN STRL REUS W/ TWL LRG LVL3 (GOWN DISPOSABLE) ×2 IMPLANT
GOWN STRL REUS W/ TWL XL LVL3 (GOWN DISPOSABLE) ×1 IMPLANT
GOWN STRL REUS W/TWL LRG LVL3 (GOWN DISPOSABLE) ×2
GOWN STRL REUS W/TWL XL LVL3 (GOWN DISPOSABLE) ×1
HANDLE YANKAUER SUCT BULB TIP (MISCELLANEOUS) ×1 IMPLANT
IV NS 1000ML (IV SOLUTION) ×1
IV NS 1000ML BAXH (IV SOLUTION) IMPLANT
IV SODIUM CHL 0.9% 500ML (IV SOLUTION) IMPLANT
KIT TURNOVER KIT A (KITS) ×1 IMPLANT
LABEL OR SOLS (LABEL) ×1 IMPLANT
MANIFOLD NEPTUNE II (INSTRUMENTS) ×1 IMPLANT
MAT ABSORB FLUID 56X50 GRAY (MISCELLANEOUS) ×1 IMPLANT
NS IRRIG 500ML POUR BTL (IV SOLUTION) ×1 IMPLANT
PACK EXTREMITY ARMC (MISCELLANEOUS) ×1 IMPLANT
PAD ABD DERMACEA PRESS 5X9 (GAUZE/BANDAGES/DRESSINGS) ×1 IMPLANT
PAD PREP OB/GYN DISP 24X41 (PERSONAL CARE ITEMS) ×1 IMPLANT
SPONGE T-LAP 18X18 ~~LOC~~+RFID (SPONGE) ×2 IMPLANT
STAPLER SKIN PROX 35W (STAPLE) IMPLANT
STOCKINETTE M/LG 89821 (MISCELLANEOUS) ×1 IMPLANT
SUT MNCRL 4-0 (SUTURE) ×1
SUT MNCRL 4-0 27XMFL (SUTURE) ×1
SUT SILK 2 0 (SUTURE) ×1
SUT SILK 2-0 18XBRD TIE 12 (SUTURE) ×1 IMPLANT
SUT SILK 3 0 (SUTURE) ×1
SUT SILK 3-0 18XBRD TIE 12 (SUTURE) ×1 IMPLANT
SUT VIC AB 0 CT1 36 (SUTURE) ×4 IMPLANT
SUT VIC AB 3-0 SH 27 (SUTURE) ×2
SUT VIC AB 3-0 SH 27X BRD (SUTURE) ×2 IMPLANT
SUT VICRYL PLUS ABS 0 54 (SUTURE) ×1 IMPLANT
SUTURE MNCRL 4-0 27XMF (SUTURE) ×1 IMPLANT
SYR 20ML LL LF (SYRINGE) ×2 IMPLANT
TAPE UMBILICAL 1/8 X36 TWILL (MISCELLANEOUS) ×1 IMPLANT
TOWEL OR 17X26 4PK STRL BLUE (TOWEL DISPOSABLE) ×2 IMPLANT
TRAP FLUID SMOKE EVACUATOR (MISCELLANEOUS) ×1 IMPLANT
WATER STERILE IRR 500ML POUR (IV SOLUTION) ×1 IMPLANT

## 2023-03-03 NOTE — Anesthesia Procedure Notes (Signed)
Procedure Name: LMA Insertion Date/Time: 03/03/2023 3:58 PM  Performed by: Merlene Pulling, CRNAPre-anesthesia Checklist: Patient identified, Patient being monitored, Timeout performed, Emergency Drugs available and Suction available Patient Re-evaluated:Patient Re-evaluated prior to induction Oxygen Delivery Method: Circle system utilized Preoxygenation: Pre-oxygenation with 100% oxygen Induction Type: IV induction Ventilation: Mask ventilation without difficulty LMA: LMA inserted LMA Size: 4.0 Tube type: Oral Number of attempts: 1 Placement Confirmation: positive ETCO2 and breath sounds checked- equal and bilateral Tube secured with: Tape Dental Injury: Teeth and Oropharynx as per pre-operative assessment

## 2023-03-03 NOTE — Consult Note (Signed)
Pharmacy Consult Note - Anticoagulation  Pharmacy Consult for IV Heparin Indication:  limb ischemia  PATIENT MEASUREMENTS: Height: 5\' 7"  (170.2 cm) Weight: 55.7 kg (122 lb 12.7 oz) IBW/kg (Calculated) : 66.1 HEPARIN DW (KG): 64.9  Recent Labs    03/03/23 0515  HGB 10.3*  HCT 31.1*  PLT 158  HEPARINUNFRC 0.36  CREATININE 0.83    Estimated Creatinine Clearance: 70.8 mL/min (by C-G formula based on SCr of 0.83 mg/dL).  PAST MEDICAL HISTORY: 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    GERD (gastroesophageal reflux disease)    Hepatitis    chronic hep c   Hydronephrosis    Hydronephrosis with ureteral stricture    Hyperlipidemia    Insomnia, unspecified    Knee pain    Left   Major depressive disorder, recurrent, moderate (HCC)    Malignant neoplasm of colon (HCC)    Malnutrition (HCC)    Muscle weakness (generalized)    Nerve pain    Other abnormalities of gait and mobility    Other chronic pain    Peripheral vascular disease (HCC)    Personal history of transient cerebral ischemia    Pressure ulcer    Prostate cancer (HCC)    Stroke (HCC)    Tinea unguium    Tobacco user    Unspecified protein-calorie malnutrition (HCC)    Ureteral cancer, right (HCC)    Urinary frequency    Venous hypertension of both lower extremities    Xerosis cutis      ASSESSMENT: 62 y.o. male with PMH PAD, chronic left foot ulceration is presenting with worsening ulceration and necrosis. Patient is not on chronic anticoagulation per chart review. Pharmacy has been consulted to initiate and manage heparin intravenous infusion.  Pertinent medications: No chronic AC PTA  Goal(s) of therapy: Heparin level 0.3 - 0.7 units/mL Monitor platelets by anticoagulation protocol: Yes   Baseline anticoagulation labs: Recent Labs    03/01/23 0433 03/02/23 0450 03/03/23 0515  HGB 10.2* 10.1* 10.3*  PLT 177 169 158      PLAN: --Heparin level is therapeutic x 6 --Continue heparin infusion at 1100 units/hr --Check heparin level with AM labs tomorrow --Daily CBC per protocol while on IV heparin  Otelia Sergeant, PharmD, [facility] 03/03/2023 5:51 AM

## 2023-03-03 NOTE — Op Note (Signed)
   OPERATIVE NOTE   PROCEDURE: Left below-the-knee amputation  PRE-OPERATIVE DIAGNOSIS: Left foot gangrene  POST-OPERATIVE DIAGNOSIS: same as above  SURGEON: Renford Dills, MD  ASSISTANT(S): Rolla Plate, NP  ANESTHESIA: general  ESTIMATED BLOOD LOSS: 50 cc  FINDING(S): Tremendous amount of edema fluid throughout the tissues.  Muscle bellies appeared healthy  SPECIMEN(S):  Left below-the-knee amputation  INDICATIONS:   Chad [name] is a 62 y.o. male who presents with left leg gangrene.  The patient is scheduled for a left below-the-knee amputation.  I discussed in depth with the patient the risks, benefits, and alternatives to this procedure.  The patient is aware that the risk of this operation included but are not limited to:  bleeding, infection, myocardial infarction, stroke, death, failure to heal amputation wound, and possible need for more proximal amputation.  The patient is aware of the risks and agrees proceed forward with the procedure.  DESCRIPTION:  After full informed written consent was obtained from the patient, the patient was brought back to the operating room, and placed supine upon the operating table.  Prior to induction, the patient received IV antibiotics.  The patient was then prepped and draped in the standard fashion for a below-the-knee amputation.  After obtaining adequate anesthesia, the patient was prepped and draped in the standard fashion for a left below-the-knee amputation.  I marked out the anterior incision two finger breadths below the tibial tuberosity and then the marked out a posterior flap that was one third of the circumference of the calf in length.   I made the incisions for these flaps, and then dissected through the subcutaneous tissue, fascia, and muscle anteriorly.  I elevated  the periosteal tissue superiorly so that the tibia was about 3-4 cm shorter than the anterior skin flap.  I then transected the tibia with a power saw and then  took a wedge off the tibia anteriorly with the power saw.  Then I smoothed out the rough edges.  In a similar fashion, I cut back the fibula about two centimeters higher than the level of the tibia with a bone cutter.  I put a bone hook into the distal tibia and then used a large amputation knife to sharply develop a tissue plane through the muscle along the fibula.  In such fashion, the posterior flap was developed.  At this point, the specimen was passed off the field as the below-the-knee amputation.  At this point, I clamped all visibly bleeding arteries and veins using a combination of suture ligation with Silk suture and electrocautery.  Bleeding continued to be controlled with electrocautery and suture ligature.  The stump was washed off with sterile normal saline and no further active bleeding was noted.  I reapproximated the anterior and posterior fascia  with interrupted stitches of 0 Vicryl.  This was completed along the entire length of anterior and posterior fascia until there were no more loose space in the fascial line. The skin was then  reapproximated with staples.  The stump was washed off and dried.  The incision was dressed with Xeroform and  then fluffs were applied.  Kerlix was wrapped around the leg and then gently an ACE wrap was applied.    COMPLICATIONS: none  CONDITION: stable   Chad [name]  03/03/2023, 5:54 PM    This note was created with Dragon Medical transcription system. Any errors in dictation are purely unintentional.

## 2023-03-03 NOTE — Interval H&P Note (Signed)
History and Physical Interval Note:  03/03/2023 3:35 PM  Chad [name]  has presented today for surgery, with the diagnosis of Gangrene left lower extremity..  The various methods of treatment have been discussed with the patient and family. After consideration of risks, benefits and other options for treatment, the patient has consented to  Procedure(s): AMPUTATION BELOW KNEE (Left) as a surgical intervention.  The patient's history has been reviewed, patient examined, no change in status, stable for surgery.  I have reviewed the patient's chart and labs.  Questions were answered to the patient's satisfaction.     [name]

## 2023-03-03 NOTE — Transfer of Care (Signed)
Immediate Anesthesia Transfer of Care Note  Patient: Chad [name]  Procedure(s) Performed: AMPUTATION BELOW KNEE (Left: Knee)  Patient Location: PACU  Anesthesia Type:General  Level of Consciousness: drowsy  Airway & Oxygen Therapy: Patient Spontanous Breathing and Patient connected to face mask oxygen  Post-op Assessment: Report given to RN and Post -op Vital signs reviewed and stable  Post vital signs: Reviewed  Last Vitals:  Vitals Value Taken Time  BP 118/80 03/03/23 1730  Temp 67F   Pulse 104 03/03/23 1733  Resp 14 03/03/23 1733  SpO2 99%   Vitals shown include unfiled device data.  Last Pain:  Vitals:   03/03/23 1300  TempSrc: Tympanic  PainSc: 5       Patients Stated Pain Goal: 0 (03/02/23 0531)  Complications: No notable events documented.

## 2023-03-03 NOTE — Progress Notes (Signed)
Patient back from surgery. Bedside report received. VSS. IVF started

## 2023-03-03 NOTE — Progress Notes (Signed)
  Progress Note   Patient: Chad [name] PIR:[id number] DOB: 11-09-1960 DOA: 02/26/2023     5 DOS: the patient was seen and examined on 03/03/2023      Subjective:  Patient seen and examined at bedside this morning Patient is awaiting left below-knee amputation today She denies nausea vomiting abdominal pain chest pain or cough    Brief hospital course: From HPI "patient is a 62 y.o. 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 Wyn Quaker on aspirin and Plavix, COPD, GERD, hepatitis C, hyperlipidemia, who presented after being referred from clinic with nonhealing left extremity wound and concerns for limb ischemia. Records reviewed from 10/2022 suggest an arteriogram with near occlussion of posterior tibial arteries, Left ATA occluded but with distal reconstitution.  1/4 to 1/2 pack/day smoker. Patient seen by vascular surgery plan for left below the knee amputation on Tuesday/Wednesday.  In the meantime patient continues to be on  antibiotic.  "   Assessment and Plan:   Non-healing wound of left lower extremity   Worsening left lower extremity chronic ulcer in the setting of chronic peripheral vascular disease. Patient is followed up by Dr. Wyn Quaker with vascular surgery as well as wound care Due to concern for limb ischemia, Vascular surgery on board Currently on heparin drip Given extensive vascular disease on angiogram, vascular surgeon is planning amputation of the left below-knee today. Case discussed with vascular surgery We will continue postoperative care and placement after Continue current antibiotics including Zosyn and vancomycin for infectious coverage Diet could be resumed after surgery   Chronic Tobacco dependence 1/2 pack/day smoker. Continue nicotine patch Counseled on smoking cessation   History of colon cancer s/p hemicolectomy 10/2022 w/  Dr. Maia Plan   Peripheral vascular disease of lower extremity with ulceration (HCC) Followed by Dr. Wyn Quaker. Vascular to see in house and advise further management. Continue heparin Patient awaiting left below-knee amputation today by vascular surgery   Hypokalemia:-Improved  Continue potassium replacement protocol. Replete magnesium as appropriate   Mild NAGMA: Lactic acid was within normal limits on admission.   Chronic pain due to neoplasm Managed by palliative care Continue outpatient follow-up   Urothelial cancer (HCC) Baseline High-grade urothelial cancer followed by Dr. Donneta Romberg Outpatient follow-up with oncologist   Protein energy Malnutrition: Body mass index 22.4kg/m2. Nutritionist to evaluate and advise on treatment.Ensure TID   DVT prophylaxis: Continue heparin   Physical Exam: Elderly male frail sitting up in bed HEENT: Neck supple.anicteric, CHEST: Clinically Diminished breath sounds [id number] S1,S2 wnm  CNS: No focal deficits EXT: Patient noted to the left lower extremity with surrounding edema     Data Reviewed: I have reviewed patient's labs including CBC, BMP, nursing documentation, transition of care manager's documentation as well as vascular surgeon documentation   Family Communication: None present at bedside        Vitals:   03/03/23 0324 03/03/23 0325 03/03/23 0748 03/03/23 1300  BP:  124/83 (!) 120/92 (!) 147/96  Pulse:  86 80 85  Resp:  18 18 17   Temp:  98.1 F (36.7 C) 98.4 F (36.9 C) (!) 97.3 F (36.3 C)  TempSrc:  Oral Oral Tympanic  SpO2:  99% 90% 100%  Weight: 55.7 kg   55.7 kg  Height:    5\' 7"  (1.702 m)    Author: Loyce Dys, MD 03/03/2023 2:28 PM  For on call review [email or web address].

## 2023-03-03 NOTE — TOC Initial Note (Signed)
Transition of Care [facility]) - Initial/Assessment Note    Patient Details  Name: Chad [name] MRN: [id number] Date of Birth: Jul 23, 1961  Transition of Care [facility]) CM/SW Contact:    Margarito Liner, LCSW Phone Number: 03/03/2023, 9:42 AM  Clinical Narrative:  CSW met with patient. No supports at bedside. CSW introduced role and explained that discharge planning would be discussed. He confirmed he is a long-term resident at [facility] and plan is to return there at discharge. Plan for OR today. Will continue to follow progress and facilitate return to SNF once medically stable.             Expected Discharge Plan: Skilled Nursing Facility Barriers to Discharge: Continued Medical Work up   Patient Goals and CMS Choice            Expected Discharge Plan and Services     Post Acute Care Choice: Resumption of Svcs/PTA Provider Living arrangements for the past 2 months: Skilled Nursing Facility                                      Prior Living Arrangements/Services Living arrangements for the past 2 months: Skilled Nursing Facility Lives with:: Facility Resident Patient language and need for interpreter reviewed:: Yes Do you feel safe going back to the place where you live?: Yes      Need for Family Participation in Patient Care: Yes (Comment) Care giver support system in place?: Yes (comment)   Criminal Activity/Legal Involvement Pertinent to Current Situation/Hospitalization: No - Comment as needed  Activities of Daily Living Home Assistive Devices/Equipment: Shower chair with back, Wheelchair ADL Screening (condition at time of admission) Patient's cognitive ability adequate to safely complete daily activities?: Yes Is the patient deaf or have difficulty hearing?: No Does the patient have difficulty seeing, even when wearing glasses/contacts?: No Does the patient have difficulty concentrating, remembering, or making decisions?: No Patient able to express  need for assistance with ADLs?: Yes Does the patient have difficulty dressing or bathing?: Yes Independently performs ADLs?: No Communication: Independent Dressing (OT): Dependent Is this a change from baseline?: Pre-admission baseline Grooming: Needs assistance Is this a change from baseline?: Pre-admission baseline Feeding: Independent Bathing: Dependent Is this a change from baseline?: Pre-admission baseline Toileting: Dependent Is this a change from baseline?: Pre-admission baseline In/Out Bed: Dependent Is this a change from baseline?: Pre-admission baseline Walks in Home: Dependent Is this a change from baseline?: Pre-admission baseline Does the patient have difficulty walking or climbing stairs?: Yes Weakness of Legs: Both Weakness of Arms/Hands: Both  Permission Sought/Granted Permission sought to share information with : Facility Industrial/product designer granted to share information with : Yes, Verbal Permission Granted     Permission granted to share info w AGENCY: [facility]        Emotional Assessment Appearance:: Appears stated age Attitude/Demeanor/Rapport: Engaged, Gracious Affect (typically observed): Accepting, Appropriate, Calm, Pleasant Orientation: : Oriented to Self, Oriented to Place, Oriented to  Time, Oriented to Situation Alcohol / Substance Use: Not Applicable Psych Involvement: No (comment)  Admission diagnosis:  Gangrene of toe (HCC) [I96] Cellulitis of left foot [L03.116] Non-healing wound of left lower extremity [S81.802A] Patient Active Problem List   Diagnosis Date Noted   Gangrene of toe (HCC) 02/27/2023   Non-healing wound of left lower extremity 02/26/2023   Tobacco abuse 02/26/2023   Cancer of sigmoid colon (HCC) 11/17/2022  Abnormal CT scan 10/23/2020   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/15/2019   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   Genetic testing 06/15/2019   Swelling of  limb 04/15/2019   History of colon cancer 03/24/2019   Family history of prostate cancer    Family history of colon cancer    Family history of leukemia    Family history of kidney cancer    Goals of care, counseling/discussion 12/25/2018   Chronic pain syndrome 11/18/2018   Peripheral vascular disease of lower extremity with ulceration (HCC) 10/04/2018   Neoplasm related pain 09/08/2018   Poor appetite 09/08/2018   Weakness generalized 07/02/2018   Anorexia 07/02/2018   Chronic pain due to neoplasm 07/02/2018   Iron deficiency anemia due to chronic blood loss 05/27/2018   Ureteral cancer, 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 [place] [address] Phone: [phone] Fax: [phone]  [id number] [place], [place] - [address] AT [facility] [address] [facility] [address] [place] [address] Phone: [phone] Fax: [phone]  [facility] [place], [place] - [address] [place] [address] Phone: [phone] Fax: [phone]     Social Determinants of Health (SDOH) Social History: SDOH Screenings   Food Insecurity: No Food Insecurity (02/26/2023)  Housing: Low Risk  (02/26/2023)  Transportation Needs: No Transportation Needs (02/26/2023)  Utilities: Not At Risk (02/26/2023)  Tobacco Use: High Risk (02/27/2023)   Received from [facility]   SDOH Interventions:     Readmission Risk Interventions     No data to display

## 2023-03-03 NOTE — Anesthesia Postprocedure Evaluation (Signed)
Anesthesia Post Note  Patient: Chad [name]  Procedure(s) Performed: AMPUTATION BELOW KNEE (Left: Knee)  Patient location during evaluation: PACU Anesthesia Type: General Level of consciousness: awake and alert Pain management: pain level controlled Vital Signs Assessment: post-procedure vital signs reviewed and stable Respiratory status: spontaneous breathing, nonlabored ventilation, respiratory function stable and patient connected to nasal cannula oxygen Cardiovascular status: blood pressure returned to baseline and stable Postop Assessment: no apparent nausea or vomiting Anesthetic complications: no   No notable events documented.   Last Vitals:  Vitals:   03/03/23 1805 03/03/23 1810  BP:    Pulse: 96 95  Resp: (!) 9 (!) 9  Temp:    SpO2: 100%     Last Pain:  Vitals:   03/03/23 1810  TempSrc:   PainSc: 4                  Yevette Edwards

## 2023-03-03 NOTE — Anesthesia Preprocedure Evaluation (Signed)
Anesthesia Evaluation  Patient identified by MRN, date of birth, ID band Patient awake    Reviewed: Allergy & Precautions, NPO status , Patient's Chart, lab work & pertinent test results  History of Anesthesia Complications Negative for: history of anesthetic complications  Airway Mallampati: IV  TM Distance: >3 FB Neck ROM: Full    Dental  (+) Edentulous Upper, Edentulous Lower, Dental Advidsory Given, Poor Dentition, Missing   Pulmonary neg pulmonary ROS, COPD,  COPD inhaler, Current Smoker and Patient abstained from smoking.   Pulmonary exam normal breath sounds clear to auscultation       Cardiovascular + Peripheral Vascular Disease (on Plavix)  negative cardio ROS Normal cardiovascular exam Rhythm:Regular Rate:Normal  ECG 11/07/22:  Normal sinus rhythm Minimal voltage criteria for LVH, may be normal variant ( R in aVL )   Neuro/Psych  PSYCHIATRIC DISORDERS Anxiety Depression    CVA (residual left hand weakness) negative neurological ROS  negative psych ROS   GI/Hepatic negative GI ROS, Neg liver ROS,GERD  ,,(+) Hepatitis -, CLynch syndrome   Endo/Other  negative endocrine ROSHypothyroidism    Renal/GU Renal disease (CKD)   Bladder CA; BPH    Musculoskeletal   Abdominal   Peds  Hematology negative hematology ROS (+) Blood dyscrasia, anemia   Anesthesia Other Findings Past Medical History: No date: Anemia No date: Anxiety No date: ARF (acute respiratory failure) (HCC) No date: Atherosclerosis of arteries of extremities (HCC) No date: Benign prostatic hyperplasia with urinary obstruction No date: Bladder cancer (HCC) No date: BPH with obstruction/lower urinary tract symptoms No date: Chronic viral hepatitis C (HCC) No date: Colon cancer (HCC) No date: COPD (chronic obstructive pulmonary disease) (HCC) No date: Depression No date: Dysphagia No date: Family history of colon cancer No date: Family history  of kidney cancer No date: Family history of leukemia No date: Family history of prostate cancer No date: Generalized anxiety disorder No date: 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 No date: GERD (gastroesophageal reflux disease) No date: Hepatitis     Comment:  chronic hep c No date: Hydronephrosis No date: Hydronephrosis with ureteral stricture No date: Hyperlipidemia No date: Insomnia, unspecified No date: Knee pain     Comment:  Left No date: Major depressive disorder, recurrent, moderate (HCC) No date: Malignant neoplasm of colon (HCC) No date: Malnutrition (HCC) No date: Muscle weakness (generalized) No date: Nerve pain No date: Other abnormalities of gait and mobility No date: Other chronic pain No date: Peripheral vascular disease (HCC) No date: Personal history of transient cerebral ischemia No date: Pressure ulcer No date: Prostate cancer (HCC) No date: Stroke [facility]) No date: Tinea unguium No date: Tobacco user No date: Unspecified protein-calorie malnutrition (HCC) No date: Ureteral cancer, right (HCC) No date: Urinary frequency No date: Venous hypertension of both lower extremities No date: Xerosis cutis  Past Surgical History: No date: COLON SURGERY     Comment:  En bloc extended right hemicolectomy 07/2017 11/06/2020: COLONOSCOPY WITH PROPOFOL; N/A     Comment:  Procedure: COLONOSCOPY WITH PROPOFOL;  Surgeon: Wyline Mood, MD;  Location: [facility] ENDOSCOPY;  Service:               Gastroenterology;  Laterality: N/A; 07/31/2021: COLONOSCOPY WITH PROPOFOL; N/A     Comment:  Procedure: COLONOSCOPY WITH PROPOFOL;  Surgeon: Earline Mayotte, MD;  Location: [facility] ENDOSCOPY;  Service:               Endoscopy;  Laterality: N/A; 08/30/2018: CYSTOSCOPY W/ RETROGRADES; Right     Comment:  Procedure: CYSTOSCOPY WITH RETROGRADE PYELOGRAM;                Surgeon: Vanna Scotland, MD;  Location: [facility] ORS;                Service: Urology;   Laterality: Right; 04/25/2018: CYSTOSCOPY WITH STENT PLACEMENT; Right     Comment:  Procedure: CYSTOSCOPY WITH STENT PLACEMENT;  Surgeon:               Vanna Scotland, MD;  Location: [facility] ORS;  Service:               Urology;  Laterality: Right; 08/30/2018: CYSTOSCOPY WITH STENT PLACEMENT; Right     Comment:  Procedure: CYSTOSCOPY WITH STENT Exchange;  Surgeon:               Vanna Scotland, MD;  Location: [facility] ORS;  Service:               Urology;  Laterality: Right; 03/07/2019: CYSTOSCOPY WITH STENT PLACEMENT; Right     Comment:  Procedure: CYSTOSCOPY WITH STENT Exchange;  Surgeon:               Vanna Scotland, MD;  Location: [facility] ORS;  Service:               Urology;  Laterality: Right; 11/21/2019: CYSTOSCOPY WITH STENT PLACEMENT; Right     Comment:  Procedure: CYSTOSCOPY WITH STENT Exchange;  Surgeon:               Vanna Scotland, MD;  Location: [facility] ORS;  Service:               Urology;  Laterality: Right; 05/23/2019: LOWER EXTREMITY ANGIOGRAPHY; Left     Comment:  Procedure: LOWER EXTREMITY ANGIOGRAPHY;  Surgeon: Annice Needy, MD;  Location: [facility] INVASIVE CV LAB;  Service:               Cardiovascular;  Laterality: Left; 05/30/2019: LOWER EXTREMITY ANGIOGRAPHY; Right     Comment:  Procedure: LOWER EXTREMITY ANGIOGRAPHY;  Surgeon: Annice Needy, MD;  Location: [facility] IN[facility] CV LAB;  Service:               Cardiovascular;  Laterality: Right; 02/13/2020: LOWER EXTREMITY ANGIOGRAPHY; Right     Comment:  Procedure: LOWER EXTREMITY ANGIOGRAPHY;  Surgeon: Annice Needy, MD;  Location: [facility];  Service:               Cardiovascular;  Laterality: Right; 02/20/2020: LOWER EXTREMITY ANGIOGRAPHY; Left     Comment:  Procedure: LOWER EXTREMITY ANGIOGRAPHY;  Surgeon: Annice Needy, MD;  Location: [facility] CV LAB;  Service:               Cardiovascular;  Laterality: Left; 01/01/2023: LOWER EXTREMITY ANGIOGRAPHY; Left      Comment:  Procedure: Lower Extremity Angiography;  Surgeon: Annice Needy, MD;  Location: [facility] INVAS[facility] CV LAB;  Service:               Cardiovascular;  Laterality: Left; 02/28/2019: PORTA CATH INSERTION; N/A     Comment:  Procedure: PORTA CATH INSERTION;  Surgeon: Annice Needy,              MD;  Location: [facility] INVASIVE CV LAB;  Service:               Cardiovascular;  Laterality: N/A; 11/17/2022: ROBOT ASSISTED LAPAROSCOPIC PARTIAL COLECTOMY No date: tumor removed   BMI    Body Mass Index: 19.23 kg/m      Reproductive/Obstetrics negative OB ROS                             Anesthesia Physical Anesthesia Plan  ASA: 3  Anesthesia Plan: General   Post-op Pain Management: Dilaudid IV   Induction: Intravenous  PONV Risk Score and Plan: 2 and Ondansetron, Dexamethasone and Midazolam  Airway Management Planned: LMA  Additional Equipment:   Intra-op Plan:   Post-operative Plan: Extubation in OR  Informed Consent: I have reviewed the patients History and Physical, chart, labs and discussed the procedure including the risks, benefits and alternatives for the proposed anesthesia with the patient or authorized representative who has indicated his/her understanding and acceptance.     Dental Advisory Given  Plan Discussed with: Anesthesiologist, CRNA and Surgeon  Anesthesia Plan Comments: (Patient consented for risks of anesthesia including but not limited to:  - adverse reactions to medications - damage to eyes, teeth, lips or other oral mucosa - nerve damage due to positioning  - sore throat or hoarseness - Damage to heart, brain, nerves, lungs, other parts of body or loss of life  Patient voiced understanding.)        Anesthesia Quick Evaluation

## 2023-03-04 ENCOUNTER — Encounter: Payer: Self-pay | Admitting: Vascular Surgery

## 2023-03-04 DIAGNOSIS — Z89512 Acquired absence of left leg below knee: Secondary | ICD-10-CM

## 2023-03-04 DIAGNOSIS — Z9889 Other specified postprocedural states: Secondary | ICD-10-CM

## 2023-03-04 DIAGNOSIS — Z7982 Long term (current) use of aspirin: Secondary | ICD-10-CM

## 2023-03-04 DIAGNOSIS — Z7902 Long term (current) use of antithrombotics/antiplatelets: Secondary | ICD-10-CM

## 2023-03-04 DIAGNOSIS — S81802A Unspecified open wound, left lower leg, initial encounter: Secondary | ICD-10-CM | POA: Diagnosis not present

## 2023-03-04 MED ORDER — CLOPIDOGREL BISULFATE 75 MG PO TABS
75.0000 mg | ORAL_TABLET | Freq: Every day | ORAL | Status: DC
  Administered 2023-03-04 – 2023-03-06 (×3): 75 mg via ORAL
  Filled 2023-03-04 (×3): qty 1

## 2023-03-04 MED ORDER — ASPIRIN 81 MG PO TBEC
81.0000 mg | DELAYED_RELEASE_TABLET | Freq: Every day | ORAL | Status: DC
  Administered 2023-03-04 – 2023-03-06 (×3): 81 mg via ORAL
  Filled 2023-03-04 (×3): qty 1

## 2023-03-04 MED ORDER — POTASSIUM CHLORIDE CRYS ER 20 MEQ PO TBCR
40.0000 meq | EXTENDED_RELEASE_TABLET | ORAL | Status: AC
  Administered 2023-03-04 (×2): 40 meq via ORAL
  Filled 2023-03-04 (×2): qty 2

## 2023-03-04 NOTE — Progress Notes (Addendum)
Progress Note   Patient: Chad [name] ZOX:[id number] DOB: 1961-06-26 DOA: 02/26/2023     6 DOS: the patient was seen and examined on 03/04/2023      Brief hospital course: From HPI "patient is a 62 y.o. 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 Wyn Quaker on aspirin and Plavix, COPD, GERD, hepatitis C, hyperlipidemia, who presented after being referred from clinic with nonhealing left extremity wound and concerns for limb ischemia. Records reviewed from 10/2022 suggest an arteriogram with near occlussion of posterior tibial arteries, Left ATA occluded but with distal reconstitution.  1/4 to 1/2 pack/day smoker. Patient seen by vascular surgery plan for left below the knee amputation on Tuesday/Wednesday.  In the meantime patient continues to be on  antibiotic.  "     Subjective:  Patient seated edge of bed this AM, states he is "doing the best I can". Having some pain, states pain control could be better.  No other acute complaints.      Assessment and Plan:   Non-healing wound of left lower extremity Due to concern for limb ischemia.   Worsening left lower extremity chronic ulcer in the setting of chronic peripheral vascular disease. Patient is followed up by Dr. 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 Continue nicotine patch Counseled on smoking cessation   History of colon cancer s/p hemicolectomy 10/2022 w/ Dr. Maia Plan   Peripheral vascular disease of lower  extremity with ulceration (HCC) Followed by Dr. Wyn Quaker. Vascular to see in house and advise further management. 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 Monitor BMP, check Mg level   Mild NAGMA: Lactic acid was within normal limits on admission.   Chronic pain due to neoplasm Managed by palliative care Continue outpatient follow-up   Urothelial cancer (HCC) Baseline High-grade urothelial cancer followed by Dr. Donneta Romberg Outpatient follow-up with oncologist   Protein energy Malnutrition: Body mass index 22.4kg/m2.  RD consulted, appreciate input Ensure TID   DVT prophylaxis: heparin      Physical Exam: General exam: awake, alert, no acute distress, chronically ill appearing HEENT: moist mucus membranes, hearing grossly normal  Respiratory system: CTAB, no wheezes, rales or rhonchi, normal respiratory effort. Cardiovascular system: normal S1/S2, RRR, no pedal edema.   Gastrointestinal system: soft, NT, ND Central nervous system: A&O x3. no gross focal neurologic deficits, normal speech Extremities: Left BKA with occlusive dressing in place, no edema, normal tone Skin: dry, intact, normal temperature Psychiatry: normal mood, flat affect       Family Communication: None present at bedside     Data Reviewed Notable labs --- K 3.2, Ca 7.6, gap 2, Hbg 9.2 from 10.3, platelets 144k    Micro - MRSA PCR screen positive Blood cultures negative & final.   Vitals:   03/04/23 0010 03/04/23 0407 03/04/23 0500 03/04/23 0828  BP: 134/82 137/77  139/84  Pulse: 87 83  85  Resp: 18 18  19   Temp: 97.9 F (36.6 C) 98.4  F (36.9 C)  97.8 F (36.6 C)  TempSrc: Oral   Oral  SpO2: 100% 94%  100%  Weight:   55.8 kg   Height:        Author: Pennie Banter, DO 03/04/2023 1:22 PM  For on call review [email or web address].

## 2023-03-04 NOTE — Plan of Care (Signed)

## 2023-03-04 NOTE — Progress Notes (Signed)
  Progress Note    03/04/2023 10:25 AM 1 Day Post-Op  Subjective:  Chad [name] is a 62 yo male  with past medical history of tobacco abuse, urethral cancer and colon cancer and PVD seen at beside today for worsening left foot wound sent by wound care. Patient with significant PAD history and interventions. Patient relates the left foot wounds were worsening and advised to come to the hospital for amputation. Patient relates he wants to amputate and no longer have trouble with wounds. Denies n/v/f/c.    Patient is now POD#1 from left below the knee amputation per his request due to extensive prior wound care. He is resting comfortably sitting up on the side of the bed eating breakfast this morning. He endorses pain 5-10 this morning. Patient instructed to ask for pain medication if needed. Patient denies any chest pain or shortness of breath this morning. Dressing remains clean dry and intact. No complaints overnight. Vitals all remain stable.    Vitals:   03/04/23 0407 03/04/23 0828  BP: 137/77 139/84  Pulse: 83 85  Resp: 18 19  Temp: 98.4 F (36.9 C) 97.8 F (36.6 C)  SpO2: 94% 100%   Physical Exam: Cardiac:  RRR, Normal S1, S2. No murmurs  Lungs:  Clear on Auscultation throughout, No rales, rhonchi or wheezing.  Incisions: Left BKA with surgical incision. Dressing clean dry and intact. No hematoma or seroma to note.  Extremities:  POD #1 from Left AKA. Healing appropriately. Dressing intact. Right lower extremity with noted sores to his heel. Warm to touch.  Abdomen:  Positive bowel sounds, soft, non tender and non distended. Neurologic: AAOX3 follows commands and answers questions appropriately.   CBC    Component Value Date/Time   WBC 6.0 03/04/2023 0508   RBC 3.34 (L) 03/04/2023 0508   HGB 9.2 (L) 03/04/2023 0508   HCT 27.5 (L) 03/04/2023 0508   PLT 144 (L) 03/04/2023 0508   MCV 82.3 03/04/2023 0508   MCH 27.5 03/04/2023 0508   MCHC 33.5 03/04/2023 0508   RDW 15.2  03/04/2023 0508   LYMPHSABS 1.1 12/29/2022 1012   MONOABS 0.5 12/29/2022 1012   EOSABS 0.2 12/29/2022 1012   BASOSABS 0.1 12/29/2022 1012    BMET    Component Value Date/Time   NA 137 03/04/2023 0508   K 3.2 (L) 03/04/2023 0508   CL 113 (H) 03/04/2023 0508   CO2 22 03/04/2023 0508   GLUCOSE 83 03/04/2023 0508   BUN 9 03/04/2023 0508   CREATININE 0.89 03/04/2023 0508   CALCIUM 7.6 (L) 03/04/2023 0508   GFRNONAA >60 03/04/2023 0508   GFRAA >60 04/19/2020 0858    INR    Component Value Date/Time   INR 1.2 02/26/2023 1922     Intake/Output Summary (Last 24 hours) at 03/04/2023 1025 Last data filed at 03/04/2023 1017 Gross per 24 hour  Intake 2026.02 ml  Output 1450 ml  Net 576.02 ml     Assessment/Plan:  62 y.o. male is s/p POD #1 from left lower extremity below the knee amputation.  1 Day Post-Op   PLAN: Advance diet to regular today. Pain medication PRN PT/OT Plan for Rehab Early next week.   DVT prophylaxis:  ASA 81 mg daily and Plavix 75 mg daily.    Marcie Bal [facility] 03/04/2023 10:25 AM

## 2023-03-04 NOTE — Plan of Care (Signed)
  Problem: Pain Managment: Goal: General experience of comfort will improve Outcome: Progressing   Problem: Skin Integrity: Goal: Risk for impaired skin integrity will decrease Outcome: Progressing   

## 2023-03-05 ENCOUNTER — Ambulatory Visit: Payer: Medicaid Other | Admitting: Physician Assistant

## 2023-03-05 DIAGNOSIS — S81802A Unspecified open wound, left lower leg, initial encounter: Secondary | ICD-10-CM | POA: Diagnosis not present

## 2023-03-05 LAB — BASIC METABOLIC PANEL
Anion gap: 5 (ref 5–15)
BUN: 9 mg/dL (ref 8–23)
CO2: 21 mmol/L — ABNORMAL LOW (ref 22–32)
Calcium: 7.9 mg/dL — ABNORMAL LOW (ref 8.9–10.3)
Chloride: 112 mmol/L — ABNORMAL HIGH (ref 98–111)
Creatinine, Ser: 0.84 mg/dL (ref 0.61–1.24)
GFR, Estimated: >60 mL/min (ref >60)
Glucose, Bld: 126 mg/dL — ABNORMAL HIGH (ref 70–99)
Potassium: 3.4 mmol/L — ABNORMAL LOW (ref 3.5–5.1)
Sodium: 138 mmol/L (ref 135–145)

## 2023-03-05 LAB — MAGNESIUM: Magnesium: 1.7 mg/dL (ref 1.7–2.4)

## 2023-03-05 MED ORDER — ORAL CARE MOUTH RINSE: 15.0000 mL | OROMUCOSAL | Status: DC | PRN

## 2023-03-05 MED ORDER — HEPARIN SOD (PORK) LOCK FLUSH 100 UNIT/ML IV SOLN: 500.0000 [IU] | INTRAVENOUS | Status: DC | PRN

## 2023-03-05 MED ORDER — HEPARIN SOD (PORK) LOCK FLUSH 100 UNIT/ML IV SOLN: 500.0000 [IU] | INTRAVENOUS | Status: DC

## 2023-03-05 MED ORDER — OXYCODONE HCL 5 MG PO TABS
10.0000 mg | ORAL_TABLET | ORAL | Status: DC | PRN
  Administered 2023-03-05 – 2023-03-06 (×6): 15 mg via ORAL
  Filled 2023-03-05 (×7): qty 3

## 2023-03-05 NOTE — Plan of Care (Signed)
  Problem: Education: Goal: Knowledge of General Education information will improve Description Including pain rating scale, medication(s)/side effects and non-pharmacologic comfort measures Outcome: Progressing   Problem: Health Behavior/Discharge Planning: Goal: Ability to manage health-related needs will improve Outcome: Progressing   

## 2023-03-05 NOTE — Consult Note (Signed)
VAST: Consult for PAC needle & dressing change  Pt refused needle & dressing change. Care RN notified.   Order canceled.   Care RN will place new order is pt. Changes his mind

## 2023-03-05 NOTE — TOC Progression Note (Signed)
Transition of Care [facility]) - Progression Note    Patient Details  Name: Chad [name] MRN: [id number] Date of Birth: [birth year]  [facility] CM/SW Contact  Chapman Fitch, RN Phone Number: 03/05/2023, 1:24 PM  Clinical Narrative:     Clovis Cao sent for signature Per MD potential DC tomorrow,  Kenney Houseman at [facility] updated   Expected Discharge Plan: Skilled Nursing Facility Barriers to Discharge: Continued Medical Work up  Expected Discharge Plan and Services     Post Acute Care Choice: Resumption of Svcs/PTA Provider Living arrangements for the past 2 months: Skilled Nursing Facility                                       Social Determinants of Health (SDOH) Interventions SDOH Screenings   Food Insecurity: No Food Insecurity (02/26/2023)  Housing: Low Risk  (02/26/2023)  Transportation Needs: No Transportation Needs (02/26/2023)  Utilities: Not At Risk (02/26/2023)  Tobacco Use: High Risk (02/27/2023)   Received from [facility]    Readmission Risk Interventions     No data to display

## 2023-03-05 NOTE — Progress Notes (Addendum)
Progress Note   Patient: Chad [name] UJW:[id number] DOB: [birth year] DOA: 02/26/2023     7 DOS: the patient was seen and examined on 03/05/2023      Brief hospital course: From HPI "patient is a 62 y.o. 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 Wyn Quaker on aspirin and Plavix, COPD, GERD, hepatitis C, hyperlipidemia, who presented after being referred from clinic with nonhealing left extremity wound and concerns for limb ischemia. Records reviewed from 10/2022 suggest an arteriogram with near occlussion of posterior tibial arteries, Left ATA occluded but with distal reconstitution.  1/4 to 1/2 pack/day smoker. Patient seen by vascular surgery plan for left below the knee amputation on Tuesday/Wednesday.  In the meantime patient continues to be on  antibiotic.  "     Subjective:  Patient seated edge of bed this AM.  He reports pain is not well controlled.  Asks for better pain control. Denies any other complaints.  Asks when he can go back to his facility       Assessment and Plan:   Non-healing wound of left lower extremity Due to concern for limb ischemia.   Worsening left lower extremity chronic ulcer in the setting of chronic peripheral vascular disease. Patient is followed up by Dr. Wyn Quaker with vascular surgery as well as wound care Vascular surgery following Initially on heparin drip Left BKA performed on 03/03/23 Case discussed with vascular surgery Treated with 6 days empiric broad spectrum IV antibiotics with Zosyn and vancomycin  Pt afebrile, no leukocytosis -- 8/7 stopped antibiotics and will continue to monitor clinically PT/OT evaluations for dc planning   Chronic Tobacco dependence 1/2 pack/day smoker. Continue nicotine patch Counseled on smoking cessation   History of colon cancer s/p hemicolectomy 10/2022 w/ Dr. Maia Plan    Peripheral vascular disease of lower extremity with ulceration (HCC) Followed by Dr. Wyn Quaker. Vascular to see in house and advise further management. 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 Monitor BMP, check Mg level   Mild NAGMA: Lactic acid was within normal limits on admission.   Chronic pain due to neoplasm Managed by palliative care Continue outpatient follow-up   Urothelial cancer (HCC) Baseline High-grade urothelial cancer followed by Dr. Donneta Romberg Outpatient follow-up with oncologist   Protein energy Malnutrition: Body mass index 22.4kg/m2.  RD consulted, appreciate input Ensure TID   DVT prophylaxis: heparin      Physical Exam: General exam: awake, alert, no acute distress, chronically ill appearing HEENT: moist mucus membranes, hearing grossly normal  Respiratory system: CTAB, no wheezes, rales or rhonchi, normal respiratory effort. Cardiovascular system: normal S1/S2, RRR, no pedal edema.   Gastrointestinal system: soft, NT, ND Central nervous system: A&O x3. no gross focal neurologic deficits, normal speech Extremities: Left BKA with occlusive dressing in place, no edema, normal tone Skin: dry, intact, normal temperature Psychiatry: normal mood, flat affect       Family Communication: None present at bedside     Data Reviewed Notable labs --- K 3.4, Ca 7.9,   Last Hbg 9.2 from 10.3, platelets 144k    Micro - MRSA PCR screen positive Blood cultures negative & final.   Vitals:   03/04/23 2015 03/05/23 0418 03/05/23 0419 03/05/23 0724  BP: (!) 151/86  134/88 (!) 132/90  Pulse: (!) 106  92 87  Resp: 18  18 18   Temp: 98.6 F (37 C)  99.3 F (37.4 C) 98.5 F (36.9 C)  TempSrc: Oral   Oral  SpO2: 100%  98% 100%  Weight:  62.7 kg    Height:        Author: Pennie Banter, DO 03/05/2023 1:09 PM  For on call review [email or web address].

## 2023-03-05 NOTE — NC FL2 (Signed)
[place] MEDICAID FL2 LEVEL OF CARE FORM     IDENTIFICATION  Patient Name: Chad [name] Birthdate: February 14, 1961 Sex: male Admission Date (Current Location): 02/26/2023  [facility] and [place] Number:  Chiropodist and Address:         Provider Number: [id number]  Attending Physician Name and Address:  Pennie Banter, DO  Relative Name and Phone Number:       Current Level of Care: Hospital Recommended Level of Care: Nursing Facility Prior Approval Number:    Date Approved/Denied:   PASRR Number: [id number]  Discharge Plan: SNF    Current Diagnoses: 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   Genetic testing 06/15/2019   Swelling of limb 04/15/2019   History of colon cancer 03/24/2019   Family history of prostate cancer    Family history of colon cancer    Family history of leukemia    Family history of kidney cancer    Goals of care, counseling/discussion 12/25/2018   Chronic pain syndrome 11/18/2018   Peripheral vascular disease of lower extremity with ulceration (HCC) 10/04/2018   Neoplasm related pain 09/08/2018   Poor appetite 09/08/2018   Weakness generalized 07/02/2018   Anorexia 07/02/2018   Chronic pain due to neoplasm 07/02/2018   Iron deficiency anemia due to chronic blood loss 05/27/2018   Ureteral cancer, 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   Palliative care by specialist    Palliative care encounter    Hydronephrosis    Urothelial cancer (HCC)    Dysphagia    Protein-calorie malnutrition, severe 04/26/2018   Severe sepsis (HCC) 04/25/2018   Sepsis (HCC) 04/25/2018    Orientation RESPIRATION BLADDER Height & Weight     Self, Time,  Situation, Place  Normal Incontinent Weight: 62.7 kg Height:  5\' 7"  (170.2 cm)  BEHAVIORAL SYMPTOMS/MOOD NEUROLOGICAL BOWEL NUTRITION STATUS      Colostomy Diet (Regular)  AMBULATORY STATUS COMMUNICATION OF NEEDS Skin   Total Care Verbally Surgical wounds, Skin abrasions, Bruising                       Personal Care Assistance Level of Assistance              Functional Limitations Info  Hearing   Hearing Info: Impaired      SPECIAL CARE FACTORS FREQUENCY                       Contractures Contractures Info: Not present    Additional Factors Info  Code Status, Allergies Code Status Info: Full Allergies Info: Penicillin           Current Medications (03/05/2023):  This is the current hospital active medication list Current Facility-Administered Medications  Medication Dose Route Frequency Provider Last Rate Last Admin   (feeding supplement) PROSource Plus liquid 30 mL  30 mL Oral BID BM Schnier, Latina Craver, MD   30 mL at 03/04/23 7846   ascorbic acid (VITAMIN C) tablet 500 mg  500 mg Oral BID Schnier, Latina Craver, MD   500 mg at 03/05/23 9629   aspirin EC tablet 81 mg  81 mg Oral Daily Marcie Bal, NP  81 mg at 03/05/23 5188   atorvastatin (LIPITOR) tablet 10 mg  10 mg Oral Daily Schnier, Latina Craver, MD   10 mg at 03/05/23 4166   Chlorhexidine Gluconate Cloth 2 % PADS 6 each  6 each Topical Daily Schnier, Latina Craver, MD   6 each at 03/04/23 [phone]   clopidogrel (PLAVIX) tablet 75 mg  75 mg Oral Daily Pace, Huel Cote R, NP   75 mg at 03/05/23 1601   feeding supplement (ENSURE ENLIVE / ENSURE PLUS) liquid 237 mL  237 mL Oral BID BM Schnier, Latina Craver, MD   237 mL at 03/04/23 0917   HYDROmorphone (DILAUDID) injection 1 mg  1 mg Intravenous Q3H PRN Renford Dills, MD   1 mg at 03/05/23 0809   ibuprofen (ADVIL) tablet 400 mg  400 mg Oral Q6H PRN Schnier, Latina Craver, MD   400 mg at 03/05/23 1206   leptospermum manuka honey (MEDIHONEY) paste 1 Application  1 Application  Topical Daily Schnier, Latina Craver, MD   1 Application at 03/05/23 [phone]   multivitamin with minerals tablet 1 tablet  1 tablet Oral Daily Schnier, Latina Craver, MD   1 tablet at 03/05/23 3557   mupirocin ointment (BACTROBAN) 2 % 1 Application  1 Application Nasal BID Schnier, Latina Craver, MD   1 Application at 03/05/23 0815   ondansetron (ZOFRAN) tablet 4 mg  4 mg Oral Q6H PRN Schnier, Latina Craver, MD       Or   ondansetron [facility]) injection 4 mg  4 mg Intravenous Q6H PRN Schnier, Latina Craver, MD       Oral care mouth rinse  15 mL Mouth Rinse PRN Esaw Grandchild A, DO       oxyCODONE (Oxy IR/ROXICODONE) immediate release tablet 10-15 mg  10-15 mg Oral Q4H PRN Esaw Grandchild A, DO   15 mg at 03/05/23 1206   oxymetazoline (AFRIN) 0.05 % nasal spray 1 spray  1 spray Each Nare BID PRN Schnier, Latina Craver, MD       Facility-Administered Medications Ordered in Other Encounters  Medication Dose Route Frequency Provider Last Rate Last Admin   heparin lock flush 100 UNIT/ML injection              Discharge Medications: Please see discharge summary for a list of discharge medications.  Relevant Imaging Results:  Relevant Lab Results:   Additional Information ss [id number]  Chapman Fitch, RN

## 2023-03-05 NOTE — Evaluation (Addendum)
Physical Therapy Evaluation Patient Details Name: Chad [name] MRN: [id number] DOB: 06-Dec-1960 Today's Date: 03/05/2023  History of Present Illness  Pt is a 62 year old male s/p L BKA 03/03/23; significant for tobacco abuse, stage IV urethral cancer, history of stage III colon cancer status post hemicolectomy, peripheral vascular disease followed by Dr. Wyn Quaker on aspirin and Plavix, COPD, GERD, hepatitis C, hyperlipidemia  Clinical Impression   Pt presents seated EOB, no complaints of pain. He currently lives in LTC at [facility]. He mobilizes with a WC and performs lateral scoot transfers to get in/out. Pt unclear on level of assistance with mobility/ADLs.   PT engaged in maximal encouragement and educated on importance of participation in therapy while in the hospital. Pt resistant to mobility and reluctantly performed lateral scooting to the L. Pt demonstrated limited UE/RLE strength to clear bed and perform effective lateral scooting but resistant to therapist assistance, anticipate pt would need physical assistance for successful transfer. Pt would benefit from continued skilled care to return to PLOF as able.       If plan is discharge home, recommend the following: Help with stairs or ramp for entrance;Direct supervision/assist for medications management;Direct supervision/assist for financial management;Assistance with cooking/housework;Assist for transportation;A lot of help with bathing/dressing/bathroom;A lot of help with walking and/or transfers   Can travel by private vehicle   No    Equipment Recommendations Other (comment) (TBD)  Recommendations for Other Services       Functional Status Assessment Patient has had a recent decline in their functional status and demonstrates the ability to make significant improvements in function in a reasonable and predictable amount of time.     Precautions / Restrictions Precautions Precautions: Fall Restrictions Weight Bearing  Restrictions:  (treated new L BKA as NWBing)      Mobility  Bed Mobility               General bed mobility comments: Pt seated EOB upon entry    Transfers Overall transfer level: Needs assistance Equipment used: None               General transfer comment: pt performed lateral scooting in bed, resistant to therapist assistance/ mobility. Pt overall limited ability in clearing bed for effective movement.    Ambulation/Gait                  Stairs            Wheelchair Mobility     Tilt Bed    Modified Rankin (Stroke Patients Only)       Balance Overall balance assessment: Needs assistance   Sitting balance-Leahy Scale: Good Sitting balance - Comments: able to perform small lateral scoot and maintain sitting                                     Pertinent Vitals/Pain Pain Assessment Pain Assessment: No/denies pain    Home Living Family/patient expects to be discharged to:: Other (Comment) (LTC facility)                   Additional Comments: Pt is a long-term resident at [facility]    Prior Function Prior Level of Function : Other (comment)             Mobility Comments: pt reports squat pivot/lateral scoot to mwc out of bed at facility, performs MRADLs via mwc PTA  ADLs Comments: pt reports he "has help when he needs it"- pt noted to be covered in food, refusing to clean up/change clothing at this time     Extremity/Trunk Assessment   Upper Extremity Assessment Upper Extremity Assessment: Generalized weakness    Lower Extremity Assessment Lower Extremity Assessment: Generalized weakness;LLE deficits/detail LLE Deficits / Details: s/p L BKA       Communication   Communication Communication: No apparent difficulties Cueing Techniques: Verbal cues;Gestural cues  Cognition Arousal: Alert Behavior During Therapy: WFL for tasks assessed/performed Overall Cognitive Status: Within  Functional Limits for tasks assessed                                          General Comments General comments (skin integrity, edema, etc.): vss throughout    Exercises     Assessment/Plan    PT Assessment Patient needs continued PT services  PT Problem List Decreased strength;Decreased range of motion;Decreased activity tolerance;Decreased balance;Decreased mobility;Decreased safety awareness;Decreased knowledge of use of DME       PT Treatment Interventions Balance training;DME instruction;Gait training;Stair training;Functional mobility training;Therapeutic activities;Therapeutic exercise;Patient/family education;Neuromuscular re-education    PT Goals (Current goals can be found in the Care Plan section)  Acute Rehab PT Goals Patient Stated Goal: return to facility PT Goal Formulation: With patient Time For Goal Achievement: 03/19/23 Potential to Achieve Goals: Good    Frequency Min 1X/week     Co-evaluation PT/OT/SLP Co-Evaluation/Treatment: Yes Reason for Co-Treatment: For patient/therapist safety;To address functional/ADL transfers PT goals addressed during session: Mobility/safety with mobility;Proper use of DME OT goals addressed during session: ADL's and self-care       AM-PAC PT "6 Clicks" Mobility  Outcome Measure Help needed turning from your back to your side while in a flat bed without using bedrails?: A Little Help needed moving from lying on your back to sitting on the side of a flat bed without using bedrails?: A Little Help needed moving to and from a bed to a chair (including a wheelchair)?: A Lot Help needed standing up from a chair using your arms (e.g., wheelchair or bedside chair)?: A Lot Help needed to walk in hospital room?: Total Help needed climbing 3-5 steps with a railing? : Total 6 Click Score: 12    End of Session   Activity Tolerance: Other (comment) (limited due to patient resistance to participate in  therapy) Patient left: in bed;with call bell/phone within reach;with bed alarm set   PT Visit Diagnosis: Other abnormalities of gait and mobility (R26.89);Muscle weakness (generalized) (M62.81);Difficulty in walking, not elsewhere classified (R26.2)    Time: 1308-6578 PT Time Calculation (min) (ACUTE ONLY): 16 min   Charges:   PT Evaluation $PT Eval Low Complexity: 1 Low   PT General Charges $$ ACUTE PT VISIT: 1 Visit         , PT, SPT 3:54 PM,03/05/23

## 2023-03-05 NOTE — Plan of Care (Signed)
  Problem: Pain Managment: Goal: General experience of comfort will improve Outcome: Progressing   Problem: Safety: Goal: Ability to remain free from injury will improve Outcome: Progressing   Problem: Skin Integrity: Goal: Risk for impaired skin integrity will decrease Outcome: Progressing   

## 2023-03-05 NOTE — Progress Notes (Signed)
Progress Note    03/05/2023 12:20 PM 2 Days Post-Op  Subjective:  Chad [name] is a 62 yo male  with past medical history of tobacco abuse, urethral cancer and colon cancer and PVD seen at beside today for worsening left foot wound sent by wound care. Patient with significant PAD history and interventions. Patient relates the left foot wounds were worsening and advised to come to the hospital for amputation. Patient relates he wants to amputate and no longer have trouble with wounds. Denies n/v/f/c.     Patient is now POD#2 from left below the knee amputation per his request due to extensive prior wound care. He is resting comfortably sitting up on the side of the bed eating breakfast this morning. He endorses pain 3-10 this morning. Patient instructed to ask for pain medication if needed. Patient denies any chest pain or shortness of breath this morning. Dressing remains clean dry and intact. No complaints overnight. Vitals all remain stable.    Vitals:   03/05/23 0419 03/05/23 0724  BP: 134/88 (!) 132/90  Pulse: 92 87  Resp: 18 18  Temp: 99.3 F (37.4 C) 98.5 F (36.9 C)  SpO2: 98% 100%   Physical Exam: Cardiac:  RRR, Normal S1, S2. No murmurs  Lungs:  Clear on Auscultation throughout, No rales, rhonchi or wheezing.  Incisions: Left BKA with surgical incision. Dressing clean dry and intact. No hematoma or seroma to note.  Extremities:  POD #1 from Left AKA. Healing appropriately. Dressing intact. Right lower extremity with noted sores to his heel. Warm to touch.  Abdomen:  Positive bowel sounds, soft, non tender and non distended. Neurologic: AAOX3 follows commands and answers questions appropriately.   CBC    Component Value Date/Time   WBC 6.0 03/04/2023 0508   RBC 3.34 (L) 03/04/2023 0508   HGB 9.2 (L) 03/04/2023 0508   HCT 27.5 (L) 03/04/2023 0508   PLT 144 (L) 03/04/2023 0508   MCV 82.3 03/04/2023 0508   MCH 27.5 03/04/2023 0508   MCHC 33.5 03/04/2023 0508   RDW  15.2 03/04/2023 0508   LYMPHSABS 1.1 12/29/2022 1012   MONOABS 0.5 12/29/2022 1012   EOSABS 0.2 12/29/2022 1012   BASOSABS 0.1 12/29/2022 1012    BMET    Component Value Date/Time   NA 138 03/05/2023 1008   K 3.4 (L) 03/05/2023 1008   CL 112 (H) 03/05/2023 1008   CO2 21 (L) 03/05/2023 1008   GLUCOSE 126 (H) 03/05/2023 1008   BUN 9 03/05/2023 1008   CREATININE 0.84 03/05/2023 1008   CALCIUM 7.9 (L) 03/05/2023 1008   GFRNONAA >60 03/05/2023 1008   GFRAA >60 04/19/2020 0858    INR    Component Value Date/Time   INR 1.2 02/26/2023 1922     Intake/Output Summary (Last 24 hours) at 03/05/2023 1220 Last data filed at 03/05/2023 1100 Gross per 24 hour  Intake 120 ml  Output 1300 ml  Net -1180 ml     Assessment/Plan:  62 y.o. male is s/p  POD #2 from left lower extremity below the knee amputation.  2 Days Post-Op   PLAN: Advance diet to regular today. Pain medication PRN PT/OT Plan to return to his current living which has nursing tomorrow if he does well overnight.  Patient to be discharged on ASA 81 mg Daily, Plavix 75 mg Daily and Lipitor 10 mg Daily.    DVT prophylaxis:  ASA 81 mg daily and Plavix 75 mg daily.    Marcie Bal  [facility] 03/05/2023 12:20 PM

## 2023-03-05 NOTE — Progress Notes (Signed)
Nutrition Follow Up Note   DOCUMENTATION CODES:   Severe malnutrition in context of chronic illness  INTERVENTION:   Discontinue Ensure Enlive   Discontinue Pro-Source Plus   Add Vital Cuisine po TID to meal trays, each supplement provides 520kcal and 22g of protein.    Magic cup TID with meals, each supplement provides 290 kcal and 9 grams of protein  MVI po daily   Vitamin C 500mg  po BID  Pt remains at high refeed risk; recommend monitor potassium, magnesium and phosphorus labs daily until stable  Daily weights   NUTRITION DIAGNOSIS:   Severe Malnutrition related to cancer and cancer related treatments as evidenced by severe fat depletion, severe muscle depletion. -ongoing   GOAL:   Patient will meet greater than or equal to 90% of their needs -progressing   MONITOR:   PO intake, Supplement acceptance, Labs, Weight trends, I & O's, Skin  ASSESSMENT:   62 y.o. 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 Pt documented to be eating 0-100% of meals. Pt ate 50% of his lunch today. Pt is refusing the Ensure and ProSource; RD will discontinue and add supplements to meal trays. Recommend continue vitamins and supplements until wound healing is complete. Pt remains at refeed risk; will check labs tomorrow. Per chart, pt is down ~5lbs since admission; this is likely r/t BKA. Plan is for pt to return to [facility].     Medications reviewed and include: vitamin C, aspirin, plavix, MVI  Labs reviewed: K 3.4(L), K 3.4(L), Mg 1.7 wnl P 2.1(L)- 8/3 Hgb 9.2(L), Hct 27.5(L)  Diet Order:   Diet Order             Diet regular Room  service appropriate? Yes; Fluid consistency: Thin  Diet effective now                  EDUCATION NEEDS:   Education needs have been addressed  Skin:  Skin Assessment: Reviewed RN Assessment  Last BM:  8/7- type 6  Height:   Ht Readings from Last 1 Encounters:  03/03/23 5\' 7"  (1.702 m)    Weight:   Wt Readings from Last 1 Encounters:  03/05/23 62.7 kg    Ideal Body Weight:  67 kg  BMI:  Body mass index is 21.65 kg/m.  Estimated Nutritional Needs:   Kcal:  1900-2200kcal/day  Protein:  95-110g/day  Fluid:  1.9-2.2L/day  Betsey Holiday MS, RD, LDN Please refer to [facility] for RD and/or RD on-call/weekend/after hours pager

## 2023-03-05 NOTE — Evaluation (Signed)
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 Wyn Quaker on aspirin and Plavix, COPD, GERD, hepatitis C, hyperlipidemia   Clinical Impression   Chart reviewed, nurse cleared pt for participation and pt is prior medicated for reported pain. Pt is alert and oriented x4, fair-poor awareness of current level of functioning and requires max verbal cues for participation in any type of functional activity on this date. PTA pt reports he transfers MOD I into mwc which he uses to mobilize around facility. Pt reports he has help for ADLs as needed. Pt refuses many provided therapeutic tasks, agreeable to scooting on bed with close supervision. Anticipate physical assist will be required for bed<>chair tranfers, pt continues to decline and provided education on importance of out of bed tasks. Pt is preforming below PLOF, would benefit from continued OT to address functional deficits. OT will follow acutely. Nurse is aware of pt status after evaluation.       If plan is discharge home, recommend the following: A lot of help with walking and/or transfers;A lot of help with bathing/dressing/bathroom    Functional Status Assessment  Patient has had a recent decline in their functional status and demonstrates the ability to make significant improvements in function in a reasonable and predictable amount of time.  Equipment Recommendations  Other (comment) (per next venue of care)    Recommendations for Other Services       Precautions / Restrictions Precautions Precautions: Fall Restrictions Weight Bearing Restrictions:  (treated new L BKA as NWBing)      Mobility Bed Mobility                General bed mobility comments: NT seated on edge of bed pre/post session    Transfers Overall transfer level: Needs assistance Equipment used: None               General transfer comment: Lateral scoot in bed (simulating prep for bed<>chair transfer as pt refuses chair transfer) with CGA. Pt will likely require physical assist for initial lateral scoot to mwc/bed      Balance Overall balance assessment: Needs assistance Sitting-balance support: Single extremity supported Sitting balance-Leahy Scale: Good                                     ADL either performed or assessed with clinical judgement   ADL Overall ADL's : Needs assistance/impaired Eating/Feeding: Set up;Sitting Eating/Feeding Details (indicate cue type and reason): at edge of bed   Grooming Details (indicate cue type and reason): pt refuses, anticipate set up             Lower Body Dressing: Maximal assistance Lower Body Dressing Details (indicate cue type and reason): anticipate               General ADL Comments: pt is declining to partiicpate in most ADL tasks despite encouragement for participatoin, he reports he just wants to go home then will participate in therapy     Vision Patient Visual Report: No change from baseline              Pertinent Vitals/Pain Pain Assessment Pain Assessment: No/denies  pain     Extremity/Trunk Assessment Upper Extremity Assessment Upper Extremity Assessment: Generalized weakness   Lower Extremity Assessment Lower Extremity Assessment: Generalized weakness;LLE deficits/detail LLE Deficits / Details: s/p L BKA       Communication Communication Communication: No apparent difficulties Cueing Techniques: Verbal cues;Gestural cues   Cognition Arousal: Alert Behavior During Therapy: Agitated Overall Cognitive Status: No family/caregiver present to determine baseline cognitive functioning Area of Impairment: Following commands,  Safety/judgement, Awareness, Problem solving                       Following Commands: Follows one step commands consistently (with encouragement) Safety/Judgement: Decreased awareness of deficits Awareness: Emergent Problem Solving: Requires verbal cues, Requires tactile cues       General Comments  vss throughout    Exercises Other Exercises Other Exercises: edu re: role of OT, role of rehab, importance of progressing mobility/participation in functoinal tasks while hospitalized   Shoulder Instructions      Home Living Family/patient expects to be discharged to:: Other (Comment) (LTC facility)                                 Additional Comments: Pt is a long-term resident at [facility] SN[facility]      Prior Functioning/Environment Prior Level of Function : Other (comment)             Mobility Comments: pt reports squat pivot/lateral scoot to mwc out of bed at facility, performs MRADLs via mwc PTA ADLs Comments: pt reports he "has help when he needs it"- pt noted to be covered in food, refusing to clean up/change clothing at this time        OT Problem List: Decreased strength;Decreased activity tolerance;Decreased knowledge of use of DME or AE;Impaired balance (sitting and/or standing);Decreased safety awareness;Decreased knowledge of precautions      OT Treatment/Interventions: Self-care/ADL training;Energy conservation;Balance training;Therapeutic exercise;DME and/or AE instruction;Therapeutic activities;Patient/family education    OT Goals(Current goals can be found in the care plan section) Acute Rehab OT Goals Patient Stated Goal: go back to [facility] and start therapy there OT Goal Formulation: With patient Time For Goal Achievement: 03/19/23 Potential to Achieve Goals: Good ADL Goals Pt Will Perform Lower Body Dressing: with supervision Pt Will Transfer to Toilet: with supervision Pt Will Perform Toileting - Clothing  Manipulation and hygiene: with supervision  OT Frequency: Min 1X/week    Co-evaluation PT/OT/SLP Co-Evaluation/Treatment: Yes Reason for Co-Treatment: For patient/therapist safety;To address functional/ADL transfers PT goals addressed during session: Mobility/safety with mobility;Proper use of DME OT goals addressed during session: ADL's and self-care      AM-PAC OT "6 Clicks" Daily Activity     Outcome Measure Help from another person eating meals?: None Help from another person taking care of personal grooming?: None Help from another person toileting, which includes using toliet, bedpan, or urinal?: A Lot Help from another person bathing (including washing, rinsing, drying)?: A Lot Help from another person to put on and taking off regular upper body clothing?: A Little Help from another person to put on and taking off regular lower body clothing?: A Lot 6 Click Score: 17   End of Session Nurse Communication: Mobility status  Activity Tolerance: Other (comment) (pt declining further mobility) Patient left: with call bell/phone within reach;with bed alarm set (at edge of bed)  OT Visit Diagnosis: Other abnormalities of gait and mobility (R26.89);Muscle weakness (generalized) (M62.81)  Time: 4403-4742 OT Time Calculation (min): 17 min Charges:  OT General Charges $OT Visit: 1 Visit OT Evaluation $OT Eval Low Complexity: 1 Low  Oleta Mouse, OTD OTR/L  03/05/23, 4:00 PM

## 2023-03-06 DIAGNOSIS — S81802A Unspecified open wound, left lower leg, initial encounter: Secondary | ICD-10-CM | POA: Diagnosis not present

## 2023-03-06 MED ORDER — HEPARIN SOD (PORK) LOCK FLUSH 100 UNIT/ML IV SOLN
500.0000 [IU] | Freq: Once | INTRAVENOUS | Status: AC
  Administered 2023-03-06: 500 [IU] via INTRAVENOUS
  Filled 2023-03-06: qty 5

## 2023-03-06 MED ORDER — POTASSIUM CHLORIDE CRYS ER 20 MEQ PO TBCR: 40.0000 meq | EXTENDED_RELEASE_TABLET | Freq: Every day | ORAL | Status: DC

## 2023-03-06 MED ORDER — IBUPROFEN 400 MG PO TABS: 400.0000 mg | ORAL_TABLET | Freq: Four times a day (QID) | ORAL | Status: DC | PRN

## 2023-03-06 MED ORDER — OXYCODONE HCL 10 MG PO TABS: 10.0000 mg | ORAL_TABLET | ORAL | 0 refills | Status: DC | PRN

## 2023-03-06 MED ORDER — MEDIHONEY WOUND/BURN DRESSING EX PSTE: 1.0000 | PASTE | Freq: Every day | CUTANEOUS | Status: DC

## 2023-03-06 MED ORDER — TRAMADOL HCL 50 MG PO TABS: 50.0000 mg | ORAL_TABLET | Freq: Four times a day (QID) | ORAL | 0 refills | Status: DC | PRN

## 2023-03-06 MED ORDER — SENNOSIDES-DOCUSATE SODIUM 8.6-50 MG PO TABS: 1.0000 | ORAL_TABLET | Freq: Every day | ORAL | Status: DC | PRN

## 2023-03-06 MED ORDER — ASCORBIC ACID 500 MG PO TABS: 500.0000 mg | ORAL_TABLET | Freq: Two times a day (BID) | ORAL | Status: AC

## 2023-03-06 NOTE — Discharge Instructions (Addendum)
DO NOT FALL ON YOUR STUMP.   Change Dressing daily. Remove old dressing. Zeroform guaze to cover the staple line. Cover with guaze, then cover with ABD pads, then wrap with Kerlix guaze, then wrap with ace bandage snugly to reduce any edema/swelling.   Exercise both lower extremities to full extension to reduce contractures and pain. Exercises to be done Every hour while awake.   Take Medications as prescribed.   Continue to follow up with wound care clinic for right lower extremity wounds.   Follow up in Vascular Surgery clinic as scheduled.

## 2023-03-06 NOTE — IPAL (Signed)
  Interdisciplinary Goals of Care Family Meeting   Date carried out: 03/06/2023  Location of the meeting: Bedside  Member's involved: Physician and Other: Patient  Durable Power of Attorney or acting medical decision maker: Patient    Discussion: We discussed goals of care for Chad [name] .   Noted that current code status was full code, but patient had prior DNR from 05/20/2018 scanned in the chart.  Discussed with patient at bedside this AM.  He is of clear mind, fully and awake alert and oriented, and capable to making his decisions.  Patient confirms to me that he would not want to be resuscitated in the event of cardiac arrest.  He states would not want CPR, defibrillation ("shocks") or to be placed on a ventilator.  He states that he feels "if the [facility] decides it is my time to go, then I would like to go".  Code status:   Code Status: DNR   Disposition: SNF/LTAC  Time spent for the meeting: 15 min    Pennie Banter, DO  03/06/2023, 10:21 AM

## 2023-03-06 NOTE — TOC Transition Note (Addendum)
Transition of Care [facility]) - CM/SW Discharge Note   Patient Details  Name: Chad [name] MRN: [id number] Date of Birth: 1961-07-15  Transition of Care [facility]) CM/SW Contact:  Margarito Liner, LCSW Phone Number: 03/06/2023, 12:35 PM   Clinical Narrative:  Patient has orders to discharge to [facility] SNF today. RN will call report to [phone] (Room 93B). EMS transport has been arranged and he is next on the list. Son asked about getting him an Mining engineer wheelchair. CSW left message for [employer] liaison to see how he needs to start that process. CSW also encouraged son to reach out to SNF social worker. No further concerns. CSW signing off.   1:43 pm: CSW ordered electric wheelchair through [employer].  Final next level of care: Skilled Nursing Facility Barriers to Discharge: Barriers Resolved   Patient Goals and CMS Choice      Discharge Placement     Existing PASRR number confirmed : 03/05/23            Patient to be transferred to facility by: EMS Name of family member notified: Patti Brannigan Patient and family notified of of transfer: 03/06/23  Discharge Plan and Services Additional resources added to the After Visit Summary for       Post Acute Care Choice: Resumption of Svcs/PTA Provider                               Social Determinants of Health (SDOH) Interventions SDOH Screenings   Food Insecurity: No Food Insecurity (02/26/2023)  Housing: Low Risk  (02/26/2023)  Transportation Needs: No Transportation Needs (02/26/2023)  Utilities: Not At Risk (02/26/2023)  Tobacco Use: High Risk (02/27/2023)   Received from [facility]     Readmission Risk Interventions     No data to display

## 2023-03-06 NOTE — Progress Notes (Signed)
Progress Note    03/06/2023 9:34 AM 3 Days Post-Op  Subjective:  Chad [name] is a 62 yo male  with past medical history of tobacco abuse, urethral cancer and colon cancer and PVD seen at beside today for worsening left foot wound sent by wound care. Patient with significant PAD history and interventions. Patient relates the left foot wounds were worsening and advised to come to the hospital for amputation. Patient relates he wants to amputate and no longer have trouble with wounds. Denies n/v/f/c.     Patient is now POD#2 from left below the knee amputation per his request due to extensive prior wound care. He is resting comfortably sitting up on the side of the bed eating breakfast this morning. He endorses pain 3-10 this morning. Patient instructed to ask for pain medication if needed. Patient denies any chest pain or shortness of breath this morning. Dressing remains clean dry and intact. Dressing changed this morning. No complaints overnight. Vitals all remain stable. Okay to discharge to his skilled nursing facility.   Patient to be discharged on ASA 81 mg daily, Plavix 75 mg Daily and Lipitor 10 mg Daily.   Vitals:   03/06/23 0408 03/06/23 0741  BP: 131/86 114/77  Pulse: 86 86  Resp: 18 18  Temp: 98.1 F (36.7 C) 98.2 F (36.8 C)  SpO2: 100% 98%   Physical Exam: Cardiac:  RRR, Normal S1, S2. No murmurs  Lungs:  Clear on Auscultation throughout, No rales, rhonchi or wheezing.  Incisions: Left BKA with surgical incision. Dressing clean dry and intact. No hematoma or seroma to note.  Extremities:  POD #1 from Left AKA. Healing appropriately. Dressing intact. Right lower extremity with noted sores to his heel. Warm to touch.  Abdomen:  Positive bowel sounds, soft, non tender and non distended. Neurologic: AAOX3 follows commands and answers questions appropriately.   CBC    Component Value Date/Time   WBC 6.4 03/06/2023 0528   RBC 3.04 (L) 03/06/2023 0528   HGB 8.3 (L)  03/06/2023 0528   HCT 25.3 (L) 03/06/2023 0528   PLT 141 (L) 03/06/2023 0528   MCV 83.2 03/06/2023 0528   MCH 27.3 03/06/2023 0528   MCHC 32.8 03/06/2023 0528   RDW 15.7 (H) 03/06/2023 0528   LYMPHSABS 1.1 12/29/2022 1012   MONOABS 0.5 12/29/2022 1012   EOSABS 0.2 12/29/2022 1012   BASOSABS 0.1 12/29/2022 1012    BMET    Component Value Date/Time   NA 140 03/06/2023 0528   K 3.4 (L) 03/06/2023 0528   CL 113 (H) 03/06/2023 0528   CO2 21 (L) 03/06/2023 0528   GLUCOSE 106 (H) 03/06/2023 0528   BUN 10 03/06/2023 0528   CREATININE 0.69 03/06/2023 0528   CALCIUM 8.1 (L) 03/06/2023 0528   GFRNONAA >60 03/06/2023 0528   GFRAA >60 04/19/2020 0858    INR    Component Value Date/Time   INR 1.2 02/26/2023 1922     Intake/Output Summary (Last 24 hours) at 03/06/2023 0934 Last data filed at 03/06/2023 0536 Gross per 24 hour  Intake 240 ml  Output 575 ml  Net -335 ml     Assessment/Plan:  62 y.o. male is s/p  POD #3 from left lower extremity below the knee amputation.  3 Days Post-Op   PLAN: Plan to return to his current living which has nursing today.   Patient to be discharged on ASA 81 mg Daily, Plavix 75 mg Daily and Lipitor 10 mg Daily.  DVT prophylaxis:  ASA 81 mg daily and Plavix 75 mg daily.    Marcie Bal [facility] 03/06/2023 9:34 AM

## 2023-03-06 NOTE — Progress Notes (Signed)
Called report to [facility] and spoke to [place] gave report on patient admitting back to room 93B

## 2023-03-06 NOTE — Discharge Summary (Addendum)
Physician Discharge Summary   Patient: Chad [name] MRN: [id number] DOB: 01-Dec-1960  Admit date:     02/26/2023  Discharge date: 03/06/23  Discharge Physician: Pennie Banter   PCP: Everardo Beals, MD   Recommendations at discharge:   Follow up with Vascular Surgery Follow up with Primary Care Repeat CBC, BMP in 1-2 weeks Continue wound care to right foot wound/s  [facility]   Discharge Diagnoses: Principal Problem:   Non-healing wound of left lower extremity Active Problems:   Urothelial cancer (HCC)   Chronic pain due to neoplasm   Peripheral vascular disease of lower extremity with ulceration (HCC)   History of colon cancer   Tobacco abuse   Gangrene of toe (HCC)  Resolved Problems:   * No resolved hospital problems. *  Hospital Course:  From HPI "patient is a 62 y.o. 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 Wyn Quaker on aspirin and Plavix, COPD, GERD, hepatitis C, hyperlipidemia, who presented after being referred from clinic with nonhealing left extremity wound and concerns for limb ischemia. Records reviewed from 10/2022 suggest an arteriogram with near occlussion of posterior tibial arteries, Left ATA occluded but with distal reconstitution.  1/4 to 1/2 pack/day smoker. Patient seen by vascular surgery plan for left below the knee amputation on Tuesday/Wednesday.  In the meantime patient continues to be on  antibiotic.  "   Further hospital course and management as outline below.   03/06/23: pt doing well, reports adequate pain control.  Seen by vascular this AM and dressing was changed.  Pt otherwise denies any complaints and is stable for discharge back to his facility today.    Assessment and Plan:  Non-healing wound of left lower extremity Due to concern for limb ischemia.   Worsening left lower  extremity chronic ulcer in the setting of chronic peripheral vascular disease. Patient is followed up by Dr. 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 Wyn Quaker.  Vascular surgery was consulted, see their recommendations Initially on IV heparin pre-op Underwent left BKA on 03/03/23 --Continue ASA, 81 mg daily, Plavix 75 mg daily, Lipitor 10 mg daily --Follow up in Vascular Surgery clinic as scheduled  Right foot and leg wounds -- chronic Seen by wound care RN in hospital.   Right heel chronic Unstageable pressure injur, slough eschar 2X2cm Right anterior calf with full thickness eschar; 5X5cm --Follow up at wound care center as outpatient --Continue wound care:  --Topical treatment orders per wound care RN:  Apply Medihoney to right heel, right great toe, and left anterior calf Q day, then cover with foam dressing.  Change foam dressing Q 3 days or PRN soiling.   Hypokalemia:- Improved with replacement but recurrent --will d/c on daily K-Cl supplement --Repeat BMP in 1-2 weeks   Mild NAGMA: Lactic acid was within normal limits on admission.   Chronic pain due to neoplasm Managed by palliative care --Continue outpatient follow-up --Pain control currently with oxycodone for post-op BKA pain --Resume tramadol when post-op pain has improved   Urothelial cancer (HCC) Baseline  High-grade urothelial cancer followed by Dr. Donneta Romberg --Outpatient follow-up with oncologist  History of colon cancer --s/p hemicolectomy 10/2022 w/ Dr. Maia Plan --Continue routine ostomy care   Protein energy Malnutrition: Body mass index 22.4kg/m2.  RD  consulted, appreciate input --Ensure drinks TID  Chronic Tobacco dependence 1/2 pack/day smoker. Continue nicotine patch Counseled on smoking cessation   Severe malnutrition -   related to chronic illness(hx CVA, hx prostate cancer) as evidenced by severe fat depletion, moderate muscle depletion, severe muscle depletion.  --Appreciate dietitian recommendations        Consultants: Vascular surgery, wound care RN Procedures performed: Left BKA   Disposition: Long term care facility  Diet recommendation:  Discharge Diet Orders (From admission, onward)     Start     Ordered   03/06/23 0000  Diet - Regular        03/06/23 1126            DISCHARGE MEDICATION: Allergies as of 03/06/2023       Reactions   Penicillins Rash        Medication List     STOP taking these medications    oxycodone 5 MG capsule Commonly known as: OXY-IR Replaced by: Oxycodone HCl 10 MG Tabs       TAKE these medications    acetaminophen 325 MG tablet Commonly known as: TYLENOL Take 650 mg by mouth 3 (three) times daily.   ascorbic acid 500 MG tablet Commonly known as: VITAMIN C Take 1 tablet (500 mg total) by mouth 2 (two) times daily.   aspirin EC 81 MG tablet Take 1 tablet (81 mg total) by mouth daily.   atorvastatin 10 MG tablet Commonly known as: Lipitor Take 1 tablet (10 mg total) by mouth daily.   CALCIUM CARBONATE-VITAMIN D3 PO Take 2 tablets by mouth daily. 600-400 mg   clopidogrel 75 MG tablet Commonly known as: PLAVIX Take 75 mg by mouth daily.   cyclobenzaprine 7.5 MG tablet Commonly known as: FEXMID Take 15 mg by mouth at bedtime.   escitalopram 5 MG tablet Commonly known as: LEXAPRO Take 5 mg by mouth daily.   Ferretts 325 (106 Fe) MG Tabs tablet Generic drug: ferrous fumarate Take 1 tablet by mouth.   gabapentin 100 MG capsule Commonly known as: NEURONTIN Take 100 mg by mouth 3 (three) times daily.   ibuprofen 400 MG tablet Commonly known  as: ADVIL Take 1 tablet (400 mg total) by mouth every 6 (six) hours as needed for mild pain or moderate pain.   leptospermum manuka honey Pste paste Apply 1 Application topically daily.   mirtazapine 7.5 MG tablet Commonly known as: REMERON Take 7.5 mg by mouth at bedtime.   multivitamin tablet Take 1 tablet by mouth daily.   Oxycodone HCl 10 MG Tabs Take 1-1.5 tablets (10-15 mg total) by mouth every 4 (four) hours as needed for severe pain or moderate pain. Replaces: oxycodone 5 MG capsule   potassium chloride SA 20 MEQ tablet Commonly known as: KLOR-CON M Take 2 tablets (40 mEq total) by mouth daily.   Pro-Stat Liqd Take 45 mLs by mouth daily.   senna 8.6 MG Tabs tablet Commonly known as: SENOKOT Take 1 tablet by mouth at bedtime.   senna-docusate 8.6-50 MG tablet Commonly known as: Senokot-S Take 1 tablet by mouth daily. What changed: Another medication with the same name was added. Make sure you understand how and when to take each.   senna-docusate 8.6-50 MG tablet Commonly known as: Senokot-S Take  1 tablet by mouth daily as needed for mild constipation. What changed: You were already taking a medication with the same name, and this prescription was added. Make sure you understand how and when to take each.   Systane 0.4-0.3 % Soln Generic drug: Polyethyl Glycol-Propyl Glycol Apply to eye.   tamsulosin 0.4 MG Caps capsule Commonly known as: Flomax Take 1 capsule (0.4 mg total) by mouth daily after supper.   traMADol 50 MG tablet Commonly known as: Ultram Take 1 tablet (50 mg total) by mouth every 6 (six) hours as needed.        Contact information for follow-up providers     Georgiana Spinner, NP Follow up in 3 day(s).   Specialty: Vascular Surgery Why: [facility] must make appointment due to transportation.   Staple removal Contact information: [address] [place] [address] [phone]              Contact information for  after-discharge care     Destination     [facility] .   Service: Skilled Nursing Contact information: [address] [place] [address] [phone]                    Discharge Exam: Ceasar Mons Weights   03/04/23 0500 03/05/23 0418 03/06/23 0730  Weight: 55.8 kg 62.7 kg 62.7 kg   General exam: awake, alert, no acute distress, chronically ill appearing HEENT: moist mucus membranes, hearing grossly normal  Respiratory system: CTAB, no wheezes, rales or rhonchi, normal respiratory effort. Cardiovascular system: normal S1/S2, RRR, RLE edema stable.   Gastrointestinal system: soft, NT, ND Central nervous system: A&O x3. no gross focal neurologic deficits, normal speech Extremities: Left BKA with clean dry intact dressing, Right foot with clean dry intact dressing, RLE edema noted and stable Skin: dry, intact, normal temperature Psychiatry: normal mood, congruent affect, judgement and insight appear normal   Condition at discharge: stable  The results of significant diagnostics from this hospitalization (including imaging, microbiology, ancillary and laboratory) are listed below for reference.   Imaging Studies: MR FOOT LEFT W WO CONTRAST  Result Date: 02/27/2023 CLINICAL DATA:  Soft tissue infection suspected. Nonhealing left extremity wound with limb ischemia. EXAM: MRI OF THE LEFT FOREFOOT WITHOUT AND WITH CONTRAST TECHNIQUE: Multiplanar, multisequence MR imaging of the left forefoot was performed both before and after administration of intravenous contrast. CONTRAST:  6mL GADAVIST GADOBUTROL 1 MMOL/ML IV SOLN COMPARISON:  None Available. FINDINGS: Despite efforts by the technologist and patient, severe motion artifact is present on today's exam and could not be eliminated. This reduces exam sensitivity and specificity. The degree of motion makes this study essentially nondiagnostic. Bones/Joint/Cartilage Suboptimally evaluated due to motion. No bone  destruction or large joint effusion identified. Ligaments Suboptimally evaluated due to motion. The Lisfranc ligament appears intact. Muscles and Tendons Suboptimally evaluated due to motion. Suspected mild generalized muscular atrophy. No focal fluid collection or gross tendon abnormality identified. T2 hyperintensity within the flexor musculature. Possible trace extensor tendon tenosynovitis. Soft tissues Suboptimally evaluated due to motion. Dorsal and medial subcutaneous edema without evidence of focal fluid collection. IMPRESSION: 1. Severely motion degraded study. The degree of motion makes this study essentially nondiagnostic. Repeat study with sedation or alternative imaging such as CT (with contrast if possible) recommended. 2. Dorsal and medial subcutaneous edema without evidence of focal fluid collection. 3. T2 hyperintensity within the flexor musculature which could be due to denervation or myositis. Possible trace extensor tendon  tenosynovitis. 4. No evidence of osteomyelitis or septic joint. Electronically Signed   By: Carey Bullocks M.D.   On: 02/27/2023 10:13    Microbiology: Results for orders placed or performed during the hospital encounter of 02/26/23  Blood culture (single)     Status: None   Collection Time: 02/26/23  3:37 PM   Specimen: BLOOD  Result Value Ref Range Status   Specimen Description BLOOD BLOOD RIGHT FOREARM  Final   Special Requests   Final    BOTTLES DRAWN AEROBIC AND ANAEROBIC Blood Culture results may not be optimal due to an inadequate volume of blood received in culture bottles   Culture   Final    NO GROWTH 5 DAYS Performed at [facility], [address], [place], [place] [address]    Report Status 03/03/2023 FINAL  Final  MRSA Next Gen by PCR, Nasal     Status: Abnormal   Collection Time: 02/28/23  4:30 AM   Specimen: Nasal Mucosa; Nasal Swab  Result Value Ref Range Status   MRSA by PCR Next Gen DETECTED (A) NOT DETECTED Final    Comment:  RESULT CALLED TO, READ BACK BY AND VERIFIED WITH: SUSANA HERNANDEZ 02/28/2023 AT 0831 SRR (NOTE) The GeneXpert MRSA Assay (FDA approved for NASAL specimens only), is one component of a comprehensive MRSA colonization surveillance program. It is not intended to diagnose MRSA infection nor to guide or monitor treatment for MRSA infections. Test performance is not FDA approved in patients less than 37 years old. Performed at [facility], [address] [place], [place] [address]     Labs: CBC: Recent Labs  Lab 03/01/23 [phone] 03/02/23 0450 03/03/23 0515 03/04/23 0508 03/06/23 0528  WBC 4.6 4.5 4.3 6.0 6.4  HGB 10.2* 10.1* 10.3* 9.2* 8.3*  HCT 30.5* 30.6* 31.1* 27.5* 25.3*  MCV 82.0 82.0 82.3 82.3 83.2  PLT 177 169 158 144* 141*   Basic Metabolic Panel: Recent Labs  Lab 02/28/23 0549 03/01/23 0433 03/02/23 0450 03/03/23 0515 03/04/23 0508 03/05/23 1008 03/06/23 0528  NA 138   < > 137 136 137 138 140  K 3.7   < > 3.5 3.5 3.2* 3.4* 3.4*  CL 112*   < > 111 110 113* 112* 113*  CO2 19*   < > 20* 19* 22 21* 21*  GLUCOSE 100*   < > 83 81 83 126* 106*  BUN 18   < > 12 9 9 9 10   CREATININE 0.82   < > 0.74 0.83 0.89 0.84 0.69  CALCIUM 8.3*   < > 8.1* 7.9* 7.6* 7.9* 8.1*  MG 2.0  --   --   --   --  1.7 1.6*  PHOS 2.1*  --   --   --   --   --  3.3   < > = values in this interval not displayed.   Liver Function Tests: Recent Labs  Lab 03/01/23 0433  AST 39  ALT 18  ALKPHOS 67  BILITOT 0.6  PROT 6.7  ALBUMIN 2.6*   CBG: No results for input(s): "GLUCAP" in the last 168 hours.  Discharge time spent: greater than 30 minutes.  Signed: Pennie Banter, DO [facility] 03/06/2023

## 2023-03-06 NOTE — Plan of Care (Signed)

## 2023-03-12 ENCOUNTER — Ambulatory Visit (INDEPENDENT_AMBULATORY_CARE_PROVIDER_SITE_OTHER): Payer: Medicaid Other | Admitting: Nurse Practitioner

## 2023-03-19 ENCOUNTER — Ambulatory Visit: Payer: Medicaid Other | Admitting: Physician Assistant

## 2023-03-24 ENCOUNTER — Other Ambulatory Visit: Payer: Self-pay | Admitting: *Deleted

## 2023-03-24 DIAGNOSIS — C661 Malignant neoplasm of right ureter: Secondary | ICD-10-CM

## 2023-03-24 DIAGNOSIS — C187 Malignant neoplasm of sigmoid colon: Secondary | ICD-10-CM

## 2023-03-26 ENCOUNTER — Encounter: Payer: Medicaid Other | Admitting: Internal Medicine

## 2023-03-26 ENCOUNTER — Inpatient Hospital Stay: Payer: Medicaid Other | Admitting: Internal Medicine

## 2023-03-26 ENCOUNTER — Inpatient Hospital Stay: Payer: Medicaid Other

## 2023-03-26 ENCOUNTER — Ambulatory Visit (INDEPENDENT_AMBULATORY_CARE_PROVIDER_SITE_OTHER): Payer: Medicaid Other | Admitting: Vascular Surgery

## 2023-03-26 ENCOUNTER — Encounter (INDEPENDENT_AMBULATORY_CARE_PROVIDER_SITE_OTHER): Payer: Self-pay | Admitting: Vascular Surgery

## 2023-03-26 VITALS — BP 109/73 | HR 91 | Resp 16

## 2023-03-26 DIAGNOSIS — I878 Other specified disorders of veins: Secondary | ICD-10-CM | POA: Diagnosis not present

## 2023-03-26 DIAGNOSIS — L98492 Non-pressure chronic ulcer of skin of other sites with fat layer exposed: Secondary | ICD-10-CM | POA: Diagnosis not present

## 2023-03-26 DIAGNOSIS — I87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 hypertension (idiopathic) with ulcer and inflammation of left lower extremity: Secondary | ICD-10-CM | POA: Diagnosis present

## 2023-03-26 DIAGNOSIS — I2510 Atherosclerotic heart disease of native coronary artery without angina pectoris: Secondary | ICD-10-CM | POA: Diagnosis not present

## 2023-03-26 DIAGNOSIS — C689 Malignant neoplasm of urinary organ, unspecified: Secondary | ICD-10-CM | POA: Diagnosis not present

## 2023-03-26 DIAGNOSIS — L97322 Non-pressure chronic ulcer of left ankle with fat layer exposed: Secondary | ICD-10-CM | POA: Diagnosis not present

## 2023-03-26 DIAGNOSIS — I739 Peripheral vascular disease, unspecified: Secondary | ICD-10-CM

## 2023-03-26 DIAGNOSIS — L89613 Pressure ulcer of right heel, stage 3: Secondary | ICD-10-CM | POA: Diagnosis not present

## 2023-03-26 DIAGNOSIS — L97512 Non-pressure chronic ulcer of other part of right foot with fat layer exposed: Secondary | ICD-10-CM | POA: Diagnosis not present

## 2023-03-26 DIAGNOSIS — I69354 Hemiplegia and hemiparesis following cerebral infarction affecting left non-dominant side: Secondary | ICD-10-CM | POA: Diagnosis not present

## 2023-03-26 DIAGNOSIS — I70245 Atherosclerosis of native arteries of left leg with ulceration of other part of foot: Secondary | ICD-10-CM | POA: Diagnosis not present

## 2023-03-26 DIAGNOSIS — J449 Chronic obstructive pulmonary disease, unspecified: Secondary | ICD-10-CM | POA: Diagnosis not present

## 2023-03-26 DIAGNOSIS — B1920 Unspecified viral hepatitis C without hepatic coma: Secondary | ICD-10-CM | POA: Diagnosis not present

## 2023-03-26 DIAGNOSIS — L97909 Non-pressure chronic ulcer of unspecified part of unspecified lower leg with unspecified severity: Secondary | ICD-10-CM

## 2023-03-26 DIAGNOSIS — Z5901 Sheltered homelessness: Secondary | ICD-10-CM | POA: Diagnosis not present

## 2023-03-26 NOTE — Progress Notes (Signed)
Patient ID: Chad [name], male   DOB: 1960-11-04, 62 y.o.   MRN: [id number]  No chief complaint on file.   HPI Chad [name] is a 62 y.o. male.    Patient returns for his first postoperative visit.  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 He denies significant pain at the incision site.  He denies fever or chills.  Patient states he is able to straighten his knee completely  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    GERD (gastroesophageal reflux disease)    Hepatitis    chronic hep c   Hydronephrosis    Hydronephrosis with ureteral stricture    Hyperlipidemia    Insomnia, unspecified    Knee pain    Left   Major depressive disorder, recurrent, moderate (HCC)    Malignant neoplasm of colon (HCC)    Malnutrition (HCC)    Muscle weakness (generalized)    Nerve pain    Other abnormalities of gait and mobility    Other chronic pain    Peripheral vascular disease (HCC)    Personal history of transient cerebral ischemia    Pressure ulcer    Prostate cancer (HCC)    Stroke (HCC)    Tinea unguium    Tobacco user    Unspecified protein-calorie malnutrition (HCC)    Ureteral cancer, right (HCC)    Urinary frequency    Venous hypertension of both lower extremities    Xerosis cutis     Past Surgical History:  Procedure Laterality Date    AMPUTATION Left 03/03/2023   Procedure: AMPUTATION BELOW KNEE;  Surgeon: Renford Dills, MD;  Location: [facility] ORS;  Service: Vascular;  Laterality: Left;   COLON SURGERY     En bloc extended right hemicolectomy 07/2017   COLONOSCOPY WITH PROPOFOL N/A 11/06/2020   Procedure: COLONOSCOPY WITH PROPOFOL;  Surgeon: Wyline Mood, MD;  Location: [facility] ENDOSCOPY;  Service: Gastroenterology;  Laterality: N/A;   COLONOSCOPY WITH PROPOFOL N/A 07/31/2021   Procedure: COLONOSCOPY WITH PROPOFOL;  Surgeon: Earline Mayotte, MD;  Location: [facility] ENDOSCOPY;  Service: Endoscopy;  Laterality: N/A;   CYSTOSCOPY W/ RETROGRADES Right 08/30/2018   Procedure: CYSTOSCOPY WITH RETROGRADE PYELOGRAM;  Surgeon: Vanna Scotland, MD;  Location: [facility] ORS;  Service: Urology;  Laterality: Right;   CYSTOSCOPY WITH STENT PLACEMENT Right 04/25/2018   Procedure: CYSTOSCOPY WITH STENT PLACEMENT;  Surgeon: Vanna Scotland, MD;  Location: [facility] ORS;  Service: Urology;  Laterality: Right;   CYSTOSCOPY WITH STENT PLACEMENT Right 08/30/2018   Procedure: CYSTOSCOPY WITH STENT Exchange;  Surgeon: Vanna Scotland, MD;  Location: [facility] ORS;  Service: Urology;  Laterality: Right;   CYSTOSCOPY WITH STENT PLACEMENT Right 03/07/2019   Procedure: CYSTOSCOPY WITH STENT Exchange;  Surgeon: Vanna Scotland, MD;  Location: [facility]  ORS;  Service: Urology;  Laterality: Right;   CYSTOSCOPY WITH STENT PLACEMENT Right 11/21/2019   Procedure: CYSTOSCOPY WITH STENT Exchange;  Surgeon: Vanna Scotland, MD;  Location: [facility] ORS;  Service: Urology;  Laterality: Right;   LOWER EXTREMITY ANGIOGRAPHY Left 05/23/2019   Procedure: LOWER EXTREMITY ANGIOGRAPHY;  Surgeon: Annice Needy, MD;  Location: [facility] INVASIVE CV LAB;  Service: Cardiovascular;  Laterality: Left;   LOWER EXTREMITY ANGIOGRAPHY Right 05/30/2019   Procedure: LOWER EXTREMITY ANGIOGRAPHY;  Surgeon: Annice Needy, MD;  Location: [facility];  Service: Cardiovascular;  Laterality: Right;   LOWER  EXTREMITY ANGIOGRAPHY Right 02/13/2020   Procedure: LOWER EXTREMITY ANGIOGRAPHY;  Surgeon: Annice Needy, MD;  Location: [facility];  Service: Cardiovascular;  Laterality: Right;   LOWER EXTREMITY ANGIOGRAPHY Left 02/20/2020   Procedure: LOWER EXTREMITY ANGIOGRAPHY;  Surgeon: Annice Needy, MD;  Location: [facility];  Service: Cardiovascular;  Laterality: Left;   LOWER EXTREMITY ANGIOGRAPHY Left 01/01/2023   Procedure: Lower Extremity Angiography;  Surgeon: Annice Needy, MD;  Location: [facility];  Service: Cardiovascular;  Laterality: Left;   PORTA CATH INSERTION N/A 02/28/2019   Procedure: PORTA CATH INSERTION;  Surgeon: Annice Needy, MD;  Location: [facility] INV[facility] LAB;  Service: Cardiovascular;  Laterality: N/A;   ROBOT ASSISTED LAPAROSCOPIC PARTIAL COLECTOMY  11/17/2022   tumor removed         Allergies  Allergen Reactions   Penicillins Rash    Current Outpatient Medications  Medication Sig Dispense Refill   acetaminophen (TYLENOL) 325 MG tablet Take 650 mg by mouth 3 (three) times daily.      Amino Acids-Protein Hydrolys (PRO-STAT) LIQD Take 45 mLs by mouth daily.     ascorbic acid (VITAMIN C) 500 MG tablet Take 1 tablet (500 mg total) by mouth 2 (two) times daily.     aspirin EC 81 MG tablet Take 1 tablet (81 mg total) by mouth daily. 150 tablet 2   atorvastatin (LIPITOR) 10 MG tablet Take 1 tablet (10 mg total) by mouth daily. 30 tablet 11   Calcium Carb-Cholecalciferol (CALCIUM CARBONATE-VITAMIN D3 PO) Take 2 tablets by mouth daily. 600-400 mg     clopidogrel (PLAVIX) 75 MG tablet Take 75 mg by mouth daily.     cyclobenzaprine (FEXMID) 7.5 MG tablet Take 15 mg by mouth at bedtime.     escitalopram (LEXAPRO) 5 MG tablet Take 5 mg by mouth daily.     ferrous fumarate (FERRETTS) 325 (106 Fe) MG TABS tablet Take 1 tablet by mouth.     gabapentin (NEURONTIN) 100 MG capsule Take 100 mg by mouth 3 (three) times daily.     ibuprofen (ADVIL) 400 MG tablet  Take 1 tablet (400 mg total) by mouth every 6 (six) hours as needed for mild pain or moderate pain.     leptospermum manuka honey (MEDIHONEY) PSTE paste Apply 1 Application topically daily.     mirtazapine (REMERON) 7.5 MG tablet Take 7.5 mg by mouth at bedtime.     Multiple Vitamin (MULTIVITAMIN) tablet Take 1 tablet by mouth daily.     oxyCODONE 10 MG TABS Take 1-1.5 tablets (10-15 mg total) by mouth every 4 (four) hours as needed for severe pain or moderate pain. 30 tablet 0   Polyethyl Glycol-Propyl Glycol (SYSTANE) 0.4-0.3 % SOLN Apply to eye.     potassium chloride SA (KLOR-CON M) 20 MEQ tablet Take 2 tablets (40 mEq total) by mouth daily.  senna (SENOKOT) 8.6 MG TABS tablet Take 1 tablet by mouth at bedtime.     senna-docusate (SENOKOT-S) 8.6-50 MG tablet Take 1 tablet by mouth daily.     senna-docusate (SENOKOT-S) 8.6-50 MG tablet Take 1 tablet by mouth daily as needed for mild constipation.     tamsulosin (FLOMAX) 0.4 MG CAPS capsule Take 1 capsule (0.4 mg total) by mouth daily after supper. 30 capsule 3   traMADol (ULTRAM) 50 MG tablet Take 1 tablet (50 mg total) by mouth every 6 (six) hours as needed. 20 tablet 0   No current facility-administered medications for this visit.   Facility-Administered Medications Ordered in Other Visits  Medication Dose Route Frequency Provider Last Rate Last Admin   heparin lock flush 100 UNIT/ML injection                 Physical Exam There were no vitals taken for this visit. Gen:  WD/poorly nourished seen in a wheelchair, mild distress Skin: incision shows some drainage and some maceration however there is no erythema suggesting infection.  There are several small areas where the skin has not coapted.  Staples are in place.  Of note his right foot is wrapped and in a bunny boot.  When asked to straighten his knee he cannot straighten past 30 degrees.    Assessment/Plan: 1. Peripheral vascular disease of lower extremity with ulceration  (HCC) Staples are removed.  The wound is then cleaned and dressed with Xeroform followed by ABD Curlex and an Ace wrap.  He will follow-up in 1 month to reassess his wound.  Sooner if he develops fever chills or increasing pain.  The need for him to keep his knee absolutely straight was discussed the importance of this with respect to a prosthesis was discussed.      [name] 03/26/2023, 12:54 PM   This note was created with Dragon medical transcription system.  Any errors from dictation are unintentional.

## 2023-03-27 ENCOUNTER — Encounter (INDEPENDENT_AMBULATORY_CARE_PROVIDER_SITE_OTHER): Payer: Self-pay | Admitting: Vascular Surgery

## 2023-03-27 NOTE — Progress Notes (Signed)
TERIC, BOGDA ([id number]) [id number] Page 1 of 15 Visit Report for 03/26/2023 HPI Details Patient Name: Date of Service: Chad [name], Chad [name] 03/26/2023 10:15 A M Medical Record Number: [id number] Patient Account Number: [id number] Date of Birth/Sex: Treating RN: Chad [name] 23, 1962 (62 y.o. [name] Primary Care Provider: [name] Other Clinician: Referring Provider: Treating Provider/Extender: Chauncey Mann, MICHA EL Clovis Cao Weeks in Treatment: 6 History of Present Illness HPI Description: 10/08/18 on evaluation today patient actually presents to our office for initial evaluation concerning wounds that he has of the bilateral lower extremities. 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 He also has bilateral venous stasis. He apparently has been homeless currently following discharge from the hospital apparently he has been placed at [facility] which is is a skilled nursing facility locally. Nonetheless fortunately he does not show any signs of infection at this time which is good news. In fact several of the wound actually appears to be showing some signs of improvement already in my pinion. There are a couple areas in the left leg in particular there likely gonna require some sharp debridement to help clear away some necrotic tissue and help with more sufficient healing. No fevers, chills, nausea, or vomiting noted at this time. 10/15/18 on evaluation today patient actually appears to be doing very well in regard to his bilateral lower extremities. He's been tolerating the dressing changes without complication. Fortunately there does not appear to be any evidence of active infection at this time which is great news. Overall I'm actually very pleased with how this has progressed in just one visits  time. Readmission: 08/14/2020 upon evaluation today patient presents for re-evaluation here in our clinic. He is having issues with his left ankle region as well as his right toe and his right heel. He tells me that the toe and heel actually began as a area that was itching that he was scratching and then subsequently opened up into wounds. These may have been abscess areas I presume based on what I am seeing currently. With regard to his left ankle region he tells me this was a similar type occurrence although he does have venous stasis this very well may be more of a venous leg ulcer more than anything. Nonetheless I do believe that the patient would benefit from appropriate and aggressive wound care to try to help get things under better control here. He does have history of a stroke on the left side affecting him to some degree there that he is able to stand although he does have some residual weakness. Otherwise again the patient does have chronic venous insufficiency as previously noted. His arterial studies most recently obtained showed that he had an ABI on the right of 1.16 with a TBI of 0.52 and on the left and ABI of 1.14 with a TBI of 0.81. That was obtained on 06/19/2020. 08/28/2020 upon evaluation today patient appears to be doing decently well in regard to his wounds in general. He has been tolerating the dressing changes without complication. Fortunately there does not appear to be any signs of active infection which is great news. With that being said I think the [facility] is doing a good job I would recommend that we likely continue with that currently. 09/11/2020 upon evaluation today patient's wounds did not appear to be doing too poorly but  again he is not really showing signs of significant improvement with regard to any of the wounds on the right. None of them have Hydrofera Blue on them I am not exactly sure why this is not being followed as the facility did not contact us to  let us know of any issues with obtaining dressings or otherwise. With that being said he is supposed to be using Hydrofera Blue on both of the wounds on the right foot as well as the ankle wound on the left side. 09/18/2020 upon evaluation today patient appears to be doing poorly with regard to his wounds. Again right now the left ankle in particular showed signs of extreme maceration. Apparently he was told by someone with staff at [facility] they could not get the [facility]. With that being said this is something that is never been relayed to [place] one way or another. Also the patient subsequently has not supposed to have a border gauze dressing on. He should have an ABD pad and roll gauze to secure as this drains much too much just to have a border gauze dressing to cover. Nonetheless the fact that they are not using the appropriate dressing is directly causing deterioration of the left ankle wound it is significantly worse today compared to what it was previous. I did attempt to call [facility] while the patient was here I called three times and got no one to even pick up the phone. After this I had my for an office coordinator call and she was able to finally get through and leave a message with the D ON as of dictation of this note which is roughly about an hour and a half later I still have not been able to speak with anyone at the facility. 09/25/2020 upon evaluation today patient actually showing signs of good improvement which is excellent news. He has been tolerating the dressing changes without complication. Fortunately there is no signs of active infection which is great news. No fevers, chills, nausea, vomiting, or diarrhea. I do feel like the facility has been doing a much better job at taking care of him as far as the dressings are concerned. However the director of nursing never did call me back. 10/09/2020 upon evaluation today patient appears to be doing well with  regard to his wound. The toe ulcer did require some debridement but the other 2 areas actually appear to be doing quite well. 10/19/2020 upon evaluation today patient actually appears to be doing very well in regard to his wounds. In fact the heel does appear to be completely healed. The toe is doing better in the medial ankle on the left is also doing better. Overall I think he is headed in the right direction. 10/26/2020 upon evaluation today patient appears to be doing well with regard to his wound. He is showing signs of improvement which is great news and overall I am very pleased with where things stand today. No fevers, chills, nausea, vomiting, or diarrhea. 11/02/2020 upon evaluation today patient appears to be doing well with regard to his wounds. He has been tolerating the dressing changes without complication overall I am extremely pleased with where things stand today. He in regard to the toe is almost completely healed and the medial ankle on the left is doing much better. PERCELL, SCHLAUD ([id number]) [id number] Page 2 of 15 11/09/2020 upon evaluation today patient appears to be doing a little poorly in regard to his left medial ankle ulcer. Fortunately there  does not appear to be any signs of systemic infection but unfortunately locally he does appear to be infected in fact he has blue-green drainage consistent with Pseudomonas. 11/16/2020 upon evaluation today patient appears to be doing well with regard to his wound. It actually appears to be doing better. I did place him on gentamicin cream since the Cipro was actually resistant even though he was positive for Pseudomonas on culture. Overall I think that he does seem to be doing better though I am unsure whether or not they have actually been putting the cream on. The patient is not sure that we did talk to the nurse directly and she was going to initiate that treatment. Fortunately there does not appear to be  any signs of active infection at this time. No fevers, chills, nausea, vomiting, or diarrhea. 4/28; the area on the right second toe is close to healed. Left medial ankle required debridement 12/07/2020 upon evaluation today patient appears to be doing well with regard to his wounds. In fact the right second toe appears to be completely healed which is great news. Fortunately there does not appear to be any signs of active infection at this time which is also great news. I think we can probably discontinue the gentamicin on top of everything else. 12/14/2020 upon evaluation today patient appears to be doing well with regard to his wound. He is making good progress and overall very pleased with where things stand today. There is no signs of active infection at this time which is great news. 12/28/2020 upon evaluation today patient appears to be doing well with regard to his wounds. He has been tolerating the dressing changes without complication. Fortunately there is no signs of active infection at this time. No fevers, chills, nausea, vomiting, or diarrhea. 12/28/2020 upon evaluation today patient's wound bed actually showed signs of excellent improvement. He has great epithelization and granulation I do not see any signs of infection overall I am extremely pleased with where things stand at this point. No fevers, chills, nausea, vomiting, or diarrhea. 01/11/2021 upon evaluation today patient appears to be doing well with regard to his wound on his leg. He has been tolerating the dressing changes without complication. Fortunately there does not appear to be any signs of active infection which is great news. No fevers, chills, nausea, vomiting, or diarrhea. 01/25/2021 upon evaluation today patient appears to be doing well with regard to his wound. He has been tolerating the dressing changes without complication. Fortunately the collagen seems to be doing a great job which is excellent news. No fevers, chills,  nausea, vomiting, or diarrhea. 02/08/2021 upon evaluation today patient's wound is actually looking a little bit worse especially in the periwound compared to previous. Fortunately there does not appear to be any signs of infection which is great news with that being said he does have some irritation around the periphery of the wound which has me more concerned. He actually had a dressing on that had not been changed in 3 days. He also is supposed to have daily dressing changes. With regard to the dressing applied he had a silver alginate dressing and silver collagen is what is recommended and ordered. He also had no Desitin around the edges of the wound in the periwound region although that is on the order inspect to be done as well. In general I was very concerned I did contact [facility] actually spoke with Verlon Au who is the scheduling individual and subsequently she stated that  she would pass the information to the D ON apparently the D ON was not available to talk to me when I call today. 02/18/2021 upon evaluation today patient's wound is actually showing signs of improvement. Fortunately there does not appear to be any evidence of infection which is great news overall I am extremely pleased with where things stand today. No fevers, chills, nausea, vomiting, or diarrhea. 8/3; patient presents for 1 week follow-up. He has no issues or complaints today. He denies signs of infection. 03/11/2021 upon evaluation today patient appears to be doing well with regard to his wound. He does have a little bit of slough noted on the surface of the wound but fortunately there does not appear to be any signs of active infection at this time. No fevers, chills, nausea, vomiting, or diarrhea. 03/18/2021 upon evaluation today patient appears to be doing well with regard to his wound. He has been tolerating the dressing changes without complication. There was a little irritation more proximal to where the wound  was that was not noted last week but nonetheless this is very superficial just seems to be more irritation we just need to make sure to put a good amount of the zinc over the area in my opinion. Otherwise he does not seem to be doing significantly worse at all which is great news. 03/25/2021 upon evaluation today patient appears to be doing well with regard to his wound. He is going require some sharp debridement today to clear with some of the necrotic debris. I did perform this today without complication postdebridement wound bed appears to be doing much better this is great news. 04/08/2021 upon evaluation today patient appears to be doing decently well in regard to his wound although the overall measurement is not significantly smaller compared to previous. It is gone down a little bit but still the facility continues to not really put the appropriate dressings in place in fact he was supposed to have collagen we think he probably had more of an allergy to At this point. Fortunately there does not appear to be any signs of active infection systemically though locally I do not see anything on initial visualization either as far as erythema or warmth. 04/15/2021 upon evaluation today patient appears to be doing well with regard to his wound. He is actually showing signs of improvement. I did place him on antibiotics last week, Cipro. He has been taking that 2 times a day and seems to be tolerating it very well. I do not see any evidence of worsening and in fact the overall appearance of the wound is smaller today which is also great news. 9/26; left medial ankle chronic venous insufficiency wound is improved. Using Hydrofera Blue 10/10; left medial ankle chronic venous insufficiency. Wound has not changed much in appearance completely nonviable surface. Apparently there have been problems getting the right product on the wound at the facility although he came in with [facility] on today 05/14/2021  upon evaluation today patient appears to be doing well with regard to his wound. I think he is making progress here which is good news. Fortunately there does not appear to be any signs of active infection at this time. No fevers, chills, nausea, vomiting, or diarrhea. 05/20/2021 upon evaluation today patient appears to be doing well with regard to his wound. He is showing signs of good improvement which is great news. There does not appear to be any evidence of active infection which is also excellent news. No  fevers, chills, nausea, vomiting, or diarrhea. 05/28/2021 upon evaluation today patient appears to be doing quite well. There does not appear to be any signs of active infection at this time which is great news. Overall I am extremely pleased with where things stand today. I think he is headed in the right direction. 06/11/2021 upon evaluation today patient appears to be doing well with regard to his left ankle ulcer and poorly in regard to the toe ulcer on the second toe right foot. This appears to show signs of joint exposure. Apparently this has been present for 1 to 2 months although he kept forgetting to tell me about it. That is unfortunate as right now it definitely appears to be doing significantly worse than what I would like to see. There does not appear to be any signs of active infection systemically though locally I am concerned about the possibility of infection the toe is quite red. Again no one from the facility ever contacted [place] to advise that this was going on in the interim either. 06/17/2021 upon evaluation today patient presents for follow-up I did review his x-ray which showed a navicular bone fracture I am unsure of the chronicity of this. Subsequently he also had osteomyelitis of the toe which was what I was more concerned about this did not show up on x-ray but did show up on the pathology scrapings. This was listed as acute osteomyelitis. Nonetheless at this point I think  that the antibiotic treatment is the best regimen to go with currently. The patient is in agreement with that plan. Nonetheless he has initially 30 days of doxycycline off likely extend that towards the end of the treatment cycle that will be around the middle of December for an additional 2 weeks. That all depends on how well he continues to heal. Nonetheless based on what I am seeing in the foot I did want a proceed with an MRI as well which I think will be helpful to identify if there is anything else that needs to be addressed from the standpoint of infection. 06/24/2021 upon evaluation today patient appears to be doing pretty well in regards to his wounds. I think both are actually showing signs of improvement which is good I did review his MRI today which did show signs of osteomyelitis of the middle and proximal phalanx on his right foot of the affected toe. With that being said this is actually showing signs of significant improvement today already with the antibiotic therapy I think the redness is also improved. Overall I Chad [name], Chad [name] ([id number]) [id number] Page 3 of 15 think that we just need to give this some time with appropriate wound care we will see how things go potentially hyperbarics could be considered. 07/02/2021 upon inspection today patient actually appears to be doing well in regard to his left ankle which is getting very close to complete resolution of pleased in that regard. Unfortunately he is continuing to have issues with his second toe right foot and this seems to still be very painful for him. Recommend he try something different from the standpoint of antibiotics. 07/15/2021 upon evaluation today patient appears to be doing actually pretty well in regard to his foot. This is actually showing signs of significant improvement which is great news. Overall I feel like the patient is improving both in regard to the second toe as well as the  ankle on the left. With that being said the biggest issue that I do see currently is that  he is needing to have a refill of the doxycycline that we previously treated him with. He also did see podiatry they are not going to recommend any amputation at this point since he seems to be doing quite well. For that reason we just need to keep things under control from an infection standpoint. 08/01/2021 upon evaluation today patient appears to be doing well with regard to his wound. He has been tolerating the dressing changes without complication. Fortunately there does not appear to be any evidence of active infection locally nor systemically at this point. In fact I think everything is doing excellent in fact his second toe on the right foot is almost healed and the ankle on the left ankle region is actually very close to being healed as well. 08/08/2021 upon evaluation today patient appears to be doing well with regard to his wound. He has been tolerating the dressing changes without complication. Fortunately I do not see any signs of active infection at this time. Readmission: 12-06-2021 upon evaluation today patient presents for reevaluation here in the clinic he does tell me that he was being seen in facility at [facility] by a provider that was coming in. He is not sure who this was. He tells me however that the wound seems to have gotten worse even compared to where it was when we last saw him at this point. With that being said I do believe that he is likely going need ongoing wound care here in the clinic and I do believe that we need to be the ones to frontline this since his wound does seem to be getting worse not better at this point. He voiced understanding. He is also in agreement with this plan and feels more comfortable coming here she tells me. Patient's medical history really has not changed since his prior admission he was only gone since January. 12-27-2021 upon evaluation today  patient appears to be doing well with regard to his wound they did run out of the [facility] so they did not put anything on just an ABD pad with gentamicin. Still we are seeing some signs of good improvement here with some new epithelization which is great news. 01-10-2022 upon evaluation today patient appears to be doing well with regard to his wounds and he is going require some sharp debridement but overall seems to be making good progress. Fortunately I do not see any evidence of active infection locally or systemically at this time which is great news. 01-24-2022 upon evaluation today patient appears to be doing well with regard to his wound. The facility actually came and dropped him off early and he had another appointment at the hospital and then they just brought him over here and this was still hours before his appointment this afternoon. For that reason we did do our best to work him in this morning and fortunately had some space to make this happen. With that being said patient's wound does seem to be making progress here and I am very pleased in that regard I do not see any signs of active infection locally or systemically at this time. 02-07-2022 patient appears to be doing well currently in regard to his wounds. In fact one of them the more proximal is healed the distal is still open but seems to be doing excellent. Fortunately I do not see any evidence of active infection locally or systemically at this time which is great news. No fevers, chills, nausea, vomiting, or diarrhea. 7/27;  left medial ankle venous. Improving per our intake nurse. We are using Hydrofera Blue under Tubigrip compression. They are changing that at his facility 03-06-2022 upon evaluation today patient appears to be doing well with regard to the his wound he is can require some sharp debridement but seems to be making excellent progress. Fortunately I do not see any evidence of active infection locally or  systemically which is great news. 03-27-2022 upon evaluation today patient's wound is actually showing signs of excellent improvement. Fortunately I see no signs of active infection locally or systemically at this time which is great news. No fevers, chills, nausea, vomiting, or diarrhea. 04-21-2022 upon evaluation today patient appears to be doing excellent in regard to his wound in fact this is very close to resolution based on what I am seeing. I do not see any evidence of active infection locally or systemically at this time which is great news and overall I am extremely pleased with where we are today. 05-05-2022 upon evaluation today patient's wound actually appears to potentially be completely healed. Fortunately I do not see any evidence of active infection at this time which is great news and overall I am very pleased I think this needs a little time to toughen up but other than that I really do believe were doing quite well. He is very pleased to hear this its been a long time coming. 05-19-2022 upon evaluation today patient appears to be doing well currently in regard to his wound. He in fact we were hoping will be completely healed and closed but it still has a very tiny area right in the center which is still continuing to drain. Fortunately I do not see any signs of infection locally or systemically which is great news. 11/6; this wound is close to completely" he comes in this week with some erythema at 1 edge of this which looks like something was rubbing on the area. Other than that no evidence of infection 06-16-2022 upon evaluation today patient appears to be doing well currently in regard to his ankle ulcer. This is very close to being healed but still has a small area in the central portion which is not. Fortunately there does not appear to be any signs of infection locally or systemically which is great news. No fevers, chills, nausea, vomiting, or diarrhea. 06-30-2022 upon  evaluation patient's wound pretty much appears to be almost completely closed. In fact it may even be closed but there are still a lot of skin irritation around the edges of the wound and I just do not feel great about releasing them yet I would like to continue to monitor this just a little bit longer before getting to that point. He is not opposed to this and in fact is happy to continue to come in if need be in order to make sure that things are moving in the right direction he definitely does not want to backtrack. 07-14-2022 upon evaluation today patient appears to be doing well currently in regard to his wound. Has been tolerating the dressing changes without complication. Fortunately I see no evidence of active infection locally nor systemically which is great news. No fevers, chills, nausea, vomiting, or diarrhea. 08-11-2022 upon evaluation today patient appears to be doing a little worse than last time I saw him although it has been a month. Unfortunately he was not able to be seen due to the fact that he was quarantined in the facility with COVID. Subsequently he tells me that  he mention to the facility staff several times about taking it off over the past several weeks but they continue to tell him that someone have to get in touch with [place] here although nobody ever did until Friday when the nurse manager called to have that documented in the communication notes. With that being said unfortunately this means that he had a wrap on that he was supposed to have on for only 1 week for ended up being on four 1 month essentially. I am glad that there is nothing worse than what we see currently to be perfectly honest. 08-19-2022 upon evaluation today patient appears to be doing well currently in regard to his wounds. He has been tolerating the dressing changes without complication. This is actually smaller today which is great news after last week's reopening. Fortunately I do not see any evidence of  infection locally nor systemically at this point. 08-26-2022 upon evaluation today patient appears to be doing well currently in regard to his wound. He has been tolerating the dressing changes without complication. Fortunately there does not appear to be any signs of active infection locally nor systemically which is great news. No fevers, chills, nausea, Chad [name], Chad [name] ([id number]) [id number] Page 4 of 15 vomiting, or diarrhea. 09-02-2022 upon evaluation today patient's wound actually showing signs of excellent improvement this is measuring smaller and looking much better. I am very pleased with where we stand I do believe that we are headed in the right direction. 09-09-2022 upon evaluation today patient appears to be doing decently well in regard to his leg in general although there was some swelling this is the 1 thing that I am not too happy with based on what I see currently. Fortunately there does not appear to be any signs of infection locally nor systemically at this point. 09-16-2022 upon evaluation today patient appears to be doing well currently in regard to his wound. He is actually showing signs of improvement he wore the Tubigrip this week and it significantly better compared to last week's evaluation. Fortunately I do not see any evidence of active infection locally or systemically which is great news. Overall I think that were headed in the right direction which is great news. In fact overall I think this is the best that he has looked in some time. He does not like the Tubigrip but actually did extremely well with this in my opinion. I think he needs to continue to utilize this. 09-23-2022 upon evaluation today patient appears to be doing well currently in regard to his wound. He has been tolerating the dressing changes without complication. Fortunately there does not appear to be any signs of active infection locally nor systemically which is great news and  in general I do feel like that we are headed in the right direction. He has been having trouble with the Tubigrip however we have gotten the facility to order compression socks he has not gotten them as of yet. 3/4; patient is about the same with a wound on the left posterior leg. This is a venous wound. He needs more compression on this leg but he took off the previous wraps we are using. I am not certain whether he is using juxta lite stockings at the nursing home. 3/12; patient presents for follow-up. He unfortunately has 2 new wounds 1 to the dorsal left side and the other to the right heel. He is not sure how these happened. The right heel appears to be caused by pressure. He  has been using Hydrofera Blue to the original wound on the left leg. 10-14-2022 upon evaluation today patient appears to be doing well currently in regard to his original wound but unfortunately he has several new wounds which I was completely unaware of before walking into the room to see him today. Unfortunately he seems to not be doing nearly as well as what I saw when I last had an appointment with him 3 weeks ago. Fortunately I do not see any signs of systemic infection though unfortunately locally he has definite infection of the left lower extremity. He is currently on doxycycline I am going to likely have to extend that today. 10-21-2022 upon evaluation today patient appears to be doing well currently in regard to his wounds. He seems to be tolerating the dressing changes without complication. Fortunately there does not appear to be any signs of active infection locally or systemically which is great news and in general I definitely feel like we are on the right track here. 10-30-2022 upon evaluation today patient still continues to not be doing too well at all with regard to his wounds. Unfortunately he is still having signs of infection as well which also has been concerned. Fortunately I do not see any signs of  active infection locally nor systemically at this time. No fevers, chills, nausea, vomiting, or diarrhea. 11-06-2022 upon evaluation today patient appears to be doing okay currently in regard to his wounds. All things considered with his blood flow not being nearly as good as what should I think that he is making pretty good progress here. Fortunately I do not see any signs of infection locally nor systemically which is great news. No fevers, chills, nausea, vomiting, or diarrhea. 11-13-2022 upon evaluation today patient appears to be doing poorly currently in regard to his wounds. Things seem to be getting a little bit worse not better. 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 Subsequently he is also going to need to have vascular intervention but we are unsure exactly when this is going on the undertaking. I would try to actually send a message to Dr. Hazle Quant and the vascular team just to see what exactly the plan is going forward. The patient is in agreement with that plan I just think we need to have a plan especially in light of the fact the left leg seems to be getting a little bit worse at this point. 11-25-2022 upon evaluation today patient is currently now discharged from the hospital following his colostomy procedure. That did very well and he seems to be doing well. Fortunately there does not appear to be any signs of active infection locally or systemically which is great news. No fevers, chills, nausea, vomiting, or diarrhea. 12-04-2022 upon evaluation today patient appears to be doing actually decently well in regard to his wounds and actually very pleased with where we stand and I think that he is making some pretty good progress here. Fortunately I do not see any signs of active infection locally nor systemically which is great news. No fevers, chills, nausea, vomiting, or  diarrhea. 12-12-2022 upon evaluation today patient appears to be doing well currently in regard to his wound. He has been tolerating the dressing changes without complication. The wounds all seem to be making some progress to a degree. He has appointment with vascular on the 23rd of this month. 5/24; the patient  was scheduled for a left lower extremity angiogram yesterday but apparently they were overbooked and they wanted to admit him to hospital last night so they could do this early this morning but he refused I see that Dr. Wyn Quaker has rescheduled him for June 6. He is using silver alginate to wounds on his bilateral lower extremities. 01-02-2023 upon evaluation today patient appears to be doing poorly in regard to his wounds still. Subsequently he did have his angiogram but apparently there really was not much that was found that could be revascularized. The patient tells me that Dr. Wyn Quaker was going to contact me here in the office I have not heard from him as of yet and I may see about giving him a call just to follow-up or possibly send a message through epic which could be an option as well. 01-09-2023 upon evaluation today patient appears to be doing poorly in regard to his legs he is extremely swollen. He has been sleeping in his chair he tells me for the past week. His bed is broken and they have ordered a new one which is supposedly supposed to be there today. With that being said he the way he needs to be not sleeping in his chair that is his wheelchair every day and through the night. This is causing his legs to be much more swollen and it seen and how badly wounds are doing at this point. This has made things significantly worse. Fortunately I do not see any signs of infection right now unfortunately I do believe that the patient has a tremendous amount of drainage currently. 01-19-2023 upon evaluation today patient appears to be doing poorly in regard to his wounds of his legs his swelling is  better but still not as good as what we would like to see. Fortunately I do not see any evidence of active infection locally nor systemically which is great news and in general I do believe that we are moving in the right direction here. Nonetheless it would be better if she was in the bed sleeping or not send up in his wheelchair although he seems to still be set up in the wheelchair at this time. He tells me that he is just not as comfortable in the bed as the wheelchair but unfortunately this is causing damage to his legs which I discussed with him today as well. Nonetheless I am not sure that he is going to be amendable to doing anything different. 01-27-2023 upon evaluation today patient appears to be doing well currently. Fortunately I do not see any evidence of active infection locally nor systemically which is great news I think he is actually doing the best option and while in fact I would try to see if we can do some debridement today to get things moving along. 02-03-2023 upon evaluation today patient's wounds unfortunately are not doing nearly as well. Will be doing the [employer] wraps unfortunately we just not seeing the improvement that I would like to see. Fortunately I do not see any evidence of infection at this time which is good news unfortunately I do think that he needs to be changed on a daily basis due to the amount of drainage otherwise I do not think this is really going to continue to improve dramatically. 02-12-2023 upon evaluation today patient appears to be doing better in a lot of ways in regard to his wounds and although his toes on the left side actually seem a bit worse. Fortunately  I do not see any evidence of active infection locally nor systemically at this time which is good news. With that being said he is having some bleeding from the left leg I think that this is an area that looks like it probably got bumped. With that being said I discussed this with the  patient Chad [name], Chad [name] ([id number]) [id number] Page 5 of 15 he tells me he does not know of any trauma that occurred but at the same time if was not this that it was skin that got pulled off. Dressing change there is only one of the 2 that could have happened. 7/25; this is a patient who is very frail at [facility]. He is nonambulatory. He has wounds on his bilateral lower legs. However much worse on the left we have been using silver cell to all of his wounds He comes in today and a lot more pain our intake nurse reports deterioration of the left foot. I ended up looking through some of his record. 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 I am really not certain of the stage of this currently or who is following 02-26-2023 upon evaluation today patient unfortunately appears to be doing significantly worse compared to where he was previous. The left foot is showing signs of dramatic worsening in regard to the overall appearance his toes are starting to become necrotic almost in full and he has much more cyanosis present than what I even seen previous. He is already seeing Dr. Wyn Quaker and unfortunately there was not anything that they saw that was an immediate cause for this but at the same time I feel like this may have changed and even worsened I feel like this could be a vascular event and I believe this is a limb-threatening situation. 03/26/2023; I last saw this  patient on 8/1. Wondered about below-knee amputations versus palliative care. 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 The wound has healed fairly well and he follows up with surgery today. He is back today looking at wounds on the right foot and lower leg. This includes 2 areas on the right heel, right dorsal foot right dorsal first and second toes and a small area on the right anterior lower tibia area. He has a lot of pain in this leg as well. The angiogram he had on 01/01/2023 specific to the right leg now demonstrated a fairly normal common femoral artery, profunda femoris artery and superficial and popliteal arteries without significant disease. 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 No doubt this has a lot to do with the discomfort in the right leg he describes when he is in bed. They have been using Xeroform on the wounds on the right. He has a lot of swelling in this leg with weeping edema Electronic Signature(s) Signed: 03/26/2023 5:20:34 PM By: Baltazar Najjar MD Entered By: Baltazar Najjar on 03/26/2023 07:56:02 -------------------------------------------------------------------------------- Physical Exam Details Patient Name:  Date of Service: Chad [name], Chad [name] 03/26/2023 10:15 A M Medical Record Number: [id number] Patient Account Number: [id number] Date of Birth/Sex: Treating RN: [birth year] (62 y.o. [name] Primary Care Provider: [name] Other Clinician: Referring Provider: Treating Provider/Extender: RO BSO N, MICHA EL Clovis Cao Weeks in Treatment: 61 Constitutional Sitting or standing Blood Pressure is within target range for patient.. Pulse regular and within target range for patient.Marland Kitchen Respirations regular, non-labored and within target range.. Temperature is normal and within the target range for the patient.. He  does not look nearly as uncomfortable as he did the last time I saw him.. Cardiovascular Pedal pulses are not palpable on the right however his popliteal pulse is palpable.. Pitting edema in the right leg with weeping fluid. There is no edema in the right thigh.. Notes Wound exam; he has 2 large wounds at the tip of the right heel and over the right Achilles area. Small wounds on the dorsal right foot dorsal right first toe medial right second toe smaller area on the right anterior mid tibia. None of this looks particularly infected. We looked at his amputation site on the left he still has the sutures in place there is 1 area medially that looks as though it might dehisce but the rest of this looks satisfactory Electronic Signature(s) Signed: 03/26/2023 5:20:34 PM By: Baltazar Najjar MD Entered By: Baltazar Najjar on 03/26/2023 07:59:25 [name] ([id number]) [id number] Page 6 of 15 -------------------------------------------------------------------------------- Physician Orders Details Patient Name: Date of Service: Chad [name], Chad [name] 03/26/2023 10:15 A M Medical Record Number: [id number] Patient Account Number: [id number] Date of Birth/Sex: Treating RN: 1960-07-31 (62 y.o. [name] Primary Care Provider: [name] Other Clinician: Referring Provider: Treating Provider/Extender: RO BSO Dorris Carnes, MICHA EL Rubye Oaks in Treatment: 103 Verbal / Phone Orders: No Diagnosis Coding Follow-up Appointments Return Appointment in 1 week. Nurse Visit as needed Bathing/ Shower/ Hygiene Clean wound with Normal Saline or wound cleanser. No tub bath. Anesthetic (Use 'Patient Medications' Section for Anesthetic Order Entry) Lidocaine applied to wound bed Edema Control - Lymphedema / Segmental Compressive Device / Other Elevate legs to the level of the heart and pump ankles as often as possible Elevate leg(s) parallel to the floor when sitting. Other:  - PATIENT MUST SLEEP IN A BED. Legs are swelling due to no elevation of legs. LBKA on 03/03/23 Off-Loading Heel suspension boot - when in bed Wound Treatment Wound #16 - Calcaneus Wound Laterality: Right Cleanser: Soap and Water 1 x Per Day/30 Days Discharge Instructions: Gently cleanse wound with antibacterial soap, rinse and pat dry prior to dressing wounds Cleanser: Wound Cleanser 1 x Per Day/30 Days Discharge Instructions: Wash your hands with soap and water. Remove old dressing, discard into plastic bag and place into trash. Cleanse the wound with Wound Cleanser prior to applying a clean dressing using gauze sponges, not tissues or cotton balls. Do not scrub or use excessive force. Pat dry using gauze sponges, not tissue or cotton balls. Prim Dressing: Silvercel Small 2x2 (in/in) 1 x Per Day/30 Days ary Discharge Instructions: Apply Silvercel Small 2x2 (in/in) as instructed Secondary Dressing: ABD Pad 5x9 (in/in) 1 x Per Day/30 Days Discharge Instructions: Cover with ABD pad Secured With: Kerlix Roll Sterile or Non-Sterile 6-ply 4.5x4 (yd/yd) 1 x Per Day/30 Days Discharge Instructions: Apply Kerlix as directed Wound #18 - T Second oe Wound Laterality: Right, Medial Cleanser: Soap and Water 1 x Per Day/30 Days Discharge Instructions: Gently cleanse wound with antibacterial soap,  rinse and pat dry prior to dressing wounds Cleanser: Wound Cleanser 1 x Per Day/30 Days Discharge Instructions: Wash your hands with soap and water. Remove old dressing, discard into plastic bag and place into trash. Cleanse the wound with Wound Cleanser prior to applying a clean dressing using gauze sponges, not tissues or cotton balls. Do not scrub or use excessive force. Pat dry using gauze sponges, not tissue or cotton balls. Prim Dressing: Silvercel Small 2x2 (in/in) 1 x Per Day/30 Days ary Discharge Instructions: Apply Silvercel Small 2x2 (in/in) as instructed Secondary Dressing: ABD Pad 5x9 (in/in) 1 x  Per Day/30 Days Chad [name], SCHANKE ([id number]) [id number] Page 7 of 15 Discharge Instructions: Cover with ABD pad Secured With: Kerlix Roll Sterile or Non-Sterile 6-ply 4.5x4 (yd/yd) 1 x Per Day/30 Days Discharge Instructions: Apply Kerlix as directed Wound #21 - Foot Wound Laterality: Dorsal, Right Cleanser: Soap and Water 1 x Per Day/30 Days Discharge Instructions: Gently cleanse wound with antibacterial soap, rinse and pat dry prior to dressing wounds Cleanser: Wound Cleanser 1 x Per Day/30 Days Discharge Instructions: Wash your hands with soap and water. Remove old dressing, discard into plastic bag and place into trash. Cleanse the wound with Wound Cleanser prior to applying a clean dressing using gauze sponges, not tissues or cotton balls. Do not scrub or use excessive force. Pat dry using gauze sponges, not tissue or cotton balls. Prim Dressing: Silvercel Small 2x2 (in/in) 1 x Per Day/30 Days ary Discharge Instructions: Apply Silvercel Small 2x2 (in/in) as instructed Secondary Dressing: ABD Pad 5x9 (in/in) 1 x Per Day/30 Days Discharge Instructions: Cover with ABD pad Secured With: Kerlix Roll Sterile or Non-Sterile 6-ply 4.5x4 (yd/yd) 1 x Per Day/30 Days Discharge Instructions: Apply Kerlix as directed Wound #22 - Lower Leg Wound Laterality: Right, Anterior Cleanser: Soap and Water 1 x Per Day/30 Days Discharge Instructions: Gently cleanse wound with antibacterial soap, rinse and pat dry prior to dressing wounds Cleanser: Wound Cleanser 1 x Per Day/30 Days Discharge Instructions: Wash your hands with soap and water. Remove old dressing, discard into plastic bag and place into trash. Cleanse the wound with Wound Cleanser prior to applying a clean dressing using gauze sponges, not tissues or cotton balls. Do not scrub or use excessive force. Pat dry using gauze sponges, not tissue or cotton balls. Prim Dressing: Silvercel Small 2x2 (in/in) 1 x Per Day/30  Days ary Discharge Instructions: Apply Silvercel Small 2x2 (in/in) as instructed Secondary Dressing: ABD Pad 5x9 (in/in) 1 x Per Day/30 Days Discharge Instructions: Cover with ABD pad Secured With: Kerlix Roll Sterile or Non-Sterile 6-ply 4.5x4 (yd/yd) 1 x Per Day/30 Days Discharge Instructions: Apply Kerlix as directed Electronic Signature(s) Signed: 03/26/2023 5:20:34 PM By: Baltazar Najjar MD Signed: 03/26/2023 5:49:25 PM By: Midge Aver MSN RN CNS WTA Previous Signature: 03/26/2023 10:41:07 AM Version By: Midge Aver MSN RN CNS WTA Entered By: Midge Aver on 03/26/2023 08:12:54 -------------------------------------------------------------------------------- Problem List Details Patient Name: Date of Service: Chad [name], Chad [name] 03/26/2023 10:15 A M Medical Record Number: [id number] Patient Account Number: [id number] Date of Birth/Sex: Treating RN: 1960-12-31 (62 y.o. [name] Primary Care Provider: [name] Other Clinician: Referring Provider: Treating Provider/Extender: RO BSO Dorris Carnes, MICHA EL Rubye Oaks in Treatment: [address] Active Problems ICD-10 Encounter CHRISTIFER, ETHERTON ([id number]) [id number] Page 8 of 15 Encounter Code Description Active Date MDM Diagnosis 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 (idiopathic) with ulcer and inflammation of left 12/06/2021 No Yes lower extremity L98.492 Non-pressure chronic ulcer of skin of other sites  with fat layer exposed 08/11/2022 No Yes L97.518 Non-pressure chronic ulcer of other part of right foot with other specified 03/26/2023 No Yes severity 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 02/19/2023 No Yes foot 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, stage 3 10/07/2022 No Yes Inactive Problems ICD-10 Code Description Active Date Inactive Date L97.322 Non-pressure chronic ulcer of left ankle with fat layer exposed 12/06/2021 12/06/2021 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 12/06/2021 12/06/2021 side Resolved Problems Electronic Signature(s) Signed: 03/26/2023 5:48:29 PM By: Midge Aver MSN RN CNS WTA Signed: 03/27/2023 7:45:25 AM By: Baltazar Najjar MD Previous Signature: 03/26/2023 5:20:34 PM Version By: Baltazar Najjar MD Entered By: Midge Aver on 03/26/2023 14:48:29 -------------------------------------------------------------------------------- Progress Note Details Patient Name: Date of Service: Chad [name], Chad [name] 03/26/2023 10:15 A M Medical Record Number: [id number] Patient Account Number: [id number] Date of Birth/Sex: Treating RN: 1960-12-10 (62 y.o. [name] Primary Care Provider: [name] Other Clinician: Referring Provider: Treating Provider/Extender: Chauncey Mann, MICHA EL Clovis Cao Weeks in Treatment: 61 Subjective History of Present Illness (HPI) 10/08/18 on evaluation today patient actually presents to our office for initial evaluation concerning wounds that he has of the bilateral lower extremities. 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 He also has bilateral venous stasis. He apparently has been homeless currently following discharge from the hospital apparently he has been placed at [facility] which is is a skilled nursing facility locally. Nonetheless fortunately he does not show any signs of infection at this time which is good news. In fact several of the wound actually appears to be showing some signs of improvement already in my pinion. There Chad [name], Chad [name] ([id number]) [id number] Page 9 of 15 are a couple areas in the left leg in particular there likely gonna require some sharp debridement to help clear away some necrotic tissue and help with more sufficient  healing. No fevers, chills, nausea, or vomiting noted at this time. 10/15/18 on evaluation today patient actually appears to be doing very well in regard to his bilateral lower extremities. He's been tolerating the dressing changes without complication. Fortunately there does not appear to be any evidence of active infection at this time which is great news. Overall I'm actually very pleased with how this has progressed in just one visits time. Readmission: 08/14/2020 upon evaluation today patient presents for re-evaluation here in our clinic. He is having issues with his left ankle region as well as his right toe and his right heel. He tells me that the toe and heel actually began as a area that was itching that he was scratching and then subsequently opened up into wounds. These may have been abscess areas I presume based on what I am seeing currently. With regard to his left ankle region he tells me this was a similar type occurrence although he does have venous stasis this very well may be more of a venous leg ulcer more than anything. Nonetheless I do believe that the patient would benefit from appropriate and aggressive wound care to try to help get things under better control here. He does have history of a stroke on the left side affecting him to some degree there that he is able to stand although he does have some residual weakness. Otherwise again the patient does have  chronic venous insufficiency as previously noted. His arterial studies most recently obtained showed that he had an ABI on the right of 1.16 with a TBI of 0.52 and on the left and ABI of 1.14 with a TBI of 0.81. That was obtained on 06/19/2020. 08/28/2020 upon evaluation today patient appears to be doing decently well in regard to his wounds in general. He has been tolerating the dressing changes without complication. Fortunately there does not appear to be any signs of active infection which is great news. With that being said I  think the [facility] is doing a good job I would recommend that we likely continue with that currently. 09/11/2020 upon evaluation today patient's wounds did not appear to be doing too poorly but again he is not really showing signs of significant improvement with regard to any of the wounds on the right. None of them have Hydrofera Blue on them I am not exactly sure why this is not being followed as the facility did not contact us to let us know of any issues with obtaining dressings or otherwise. With that being said he is supposed to be using Hydrofera Blue on both of the wounds on the right foot as well as the ankle wound on the left side. 09/18/2020 upon evaluation today patient appears to be doing poorly with regard to his wounds. Again right now the left ankle in particular showed signs of extreme maceration. Apparently he was told by someone with staff at [facility] they could not get the [facility]. With that being said this is something that is never been relayed to [place] one way or another. Also the patient subsequently has not supposed to have a border gauze dressing on. He should have an ABD pad and roll gauze to secure as this drains much too much just to have a border gauze dressing to cover. Nonetheless the fact that they are not using the appropriate dressing is directly causing deterioration of the left ankle wound it is significantly worse today compared to what it was previous. I did attempt to call [facility] while the patient was here I called three times and got no one to even pick up the phone. After this I had my for an office coordinator call and she was able to finally get through and leave a message with the D ON as of dictation of this note which is roughly about an hour and a half later I still have not been able to speak with anyone at the facility. 09/25/2020 upon evaluation today patient actually showing signs of good improvement which is excellent  news. He has been tolerating the dressing changes without complication. Fortunately there is no signs of active infection which is great news. No fevers, chills, nausea, vomiting, or diarrhea. I do feel like the facility has been doing a much better job at taking care of him as far as the dressings are concerned. However the director of nursing never did call me back. 10/09/2020 upon evaluation today patient appears to be doing well with regard to his wound. The toe ulcer did require some debridement but the other 2 areas actually appear to be doing quite well. 10/19/2020 upon evaluation today patient actually appears to be doing very well in regard to his wounds. In fact the heel does appear to be completely healed. The toe is doing better in the medial ankle on the left is also doing better. Overall I think he is headed in the right direction.  10/26/2020 upon evaluation today patient appears to be doing well with regard to his wound. He is showing signs of improvement which is great news and overall I am very pleased with where things stand today. No fevers, chills, nausea, vomiting, or diarrhea. 11/02/2020 upon evaluation today patient appears to be doing well with regard to his wounds. He has been tolerating the dressing changes without complication overall I am extremely pleased with where things stand today. He in regard to the toe is almost completely healed and the medial ankle on the left is doing much better. 11/09/2020 upon evaluation today patient appears to be doing a little poorly in regard to his left medial ankle ulcer. Fortunately there does not appear to be any signs of systemic infection but unfortunately locally he does appear to be infected in fact he has blue-green drainage consistent with Pseudomonas. 11/16/2020 upon evaluation today patient appears to be doing well with regard to his wound. It actually appears to be doing better. I did place him on gentamicin cream since the Cipro was  actually resistant even though he was positive for Pseudomonas on culture. Overall I think that he does seem to be doing better though I am unsure whether or not they have actually been putting the cream on. The patient is not sure that we did talk to the nurse directly and she was going to initiate that treatment. Fortunately there does not appear to be any signs of active infection at this time. No fevers, chills, nausea, vomiting, or diarrhea. 4/28; the area on the right second toe is close to healed. Left medial ankle required debridement 12/07/2020 upon evaluation today patient appears to be doing well with regard to his wounds. In fact the right second toe appears to be completely healed which is great news. Fortunately there does not appear to be any signs of active infection at this time which is also great news. I think we can probably discontinue the gentamicin on top of everything else. 12/14/2020 upon evaluation today patient appears to be doing well with regard to his wound. He is making good progress and overall very pleased with where things stand today. There is no signs of active infection at this time which is great news. 12/28/2020 upon evaluation today patient appears to be doing well with regard to his wounds. He has been tolerating the dressing changes without complication. Fortunately there is no signs of active infection at this time. No fevers, chills, nausea, vomiting, or diarrhea. 12/28/2020 upon evaluation today patient's wound bed actually showed signs of excellent improvement. He has great epithelization and granulation I do not see any signs of infection overall I am extremely pleased with where things stand at this point. No fevers, chills, nausea, vomiting, or diarrhea. 01/11/2021 upon evaluation today patient appears to be doing well with regard to his wound on his leg. He has been tolerating the dressing changes without complication. Fortunately there does not appear to be  any signs of active infection which is great news. No fevers, chills, nausea, vomiting, or diarrhea. 01/25/2021 upon evaluation today patient appears to be doing well with regard to his wound. He has been tolerating the dressing changes without complication. Fortunately the collagen seems to be doing a great job which is excellent news. No fevers, chills, nausea, vomiting, or diarrhea. 02/08/2021 upon evaluation today patient's wound is actually looking a little bit worse especially in the periwound compared to previous. Fortunately there does not appear to be any signs of infection  which is great news with that being said he does have some irritation around the periphery of the wound which has me more concerned. He actually had a dressing on that had not been changed in 3 days. He also is supposed to have daily dressing changes. With regard to the dressing applied he had a silver alginate dressing and silver collagen is what is recommended and ordered. He also had no Desitin around the edges of the wound in the periwound region although that is on the order inspect to be done as well. In general I was very concerned I did contact [facility] actually spoke with Verlon Au who is the scheduling individual and subsequently she stated that she would pass the information to the D ON apparently the D ON was not available to talk to me when I call today. 02/18/2021 upon evaluation today patient's wound is actually showing signs of improvement. Fortunately there does not appear to be any evidence of infection which is great news overall I am extremely pleased with where things stand today. No fevers, chills, nausea, vomiting, or diarrhea. Chad [name], Chad [name] ([id number]) [id number] Page 10 of 15 8/3; patient presents for 1 week follow-up. He has no issues or complaints today. He denies signs of infection. 03/11/2021 upon evaluation today patient appears to be doing well with regard to  his wound. He does have a little bit of slough noted on the surface of the wound but fortunately there does not appear to be any signs of active infection at this time. No fevers, chills, nausea, vomiting, or diarrhea. 03/18/2021 upon evaluation today patient appears to be doing well with regard to his wound. He has been tolerating the dressing changes without complication. There was a little irritation more proximal to where the wound was that was not noted last week but nonetheless this is very superficial just seems to be more irritation we just need to make sure to put a good amount of the zinc over the area in my opinion. Otherwise he does not seem to be doing significantly worse at all which is great news. 03/25/2021 upon evaluation today patient appears to be doing well with regard to his wound. He is going require some sharp debridement today to clear with some of the necrotic debris. I did perform this today without complication postdebridement wound bed appears to be doing much better this is great news. 04/08/2021 upon evaluation today patient appears to be doing decently well in regard to his wound although the overall measurement is not significantly smaller compared to previous. It is gone down a little bit but still the facility continues to not really put the appropriate dressings in place in fact he was supposed to have collagen we think he probably had more of an allergy to At this point. Fortunately there does not appear to be any signs of active infection systemically though locally I do not see anything on initial visualization either as far as erythema or warmth. 04/15/2021 upon evaluation today patient appears to be doing well with regard to his wound. He is actually showing signs of improvement. I did place him on antibiotics last week, Cipro. He has been taking that 2 times a day and seems to be tolerating it very well. I do not see any evidence of worsening and in fact the  overall appearance of the wound is smaller today which is also great news. 9/26; left medial ankle chronic venous insufficiency wound is improved. Using Hydrofera Blue 10/10; left medial ankle  chronic venous insufficiency. Wound has not changed much in appearance completely nonviable surface. Apparently there have been problems getting the right product on the wound at the facility although he came in with [facility] on today 05/14/2021 upon evaluation today patient appears to be doing well with regard to his wound. I think he is making progress here which is good news. Fortunately there does not appear to be any signs of active infection at this time. No fevers, chills, nausea, vomiting, or diarrhea. 05/20/2021 upon evaluation today patient appears to be doing well with regard to his wound. He is showing signs of good improvement which is great news. There does not appear to be any evidence of active infection which is also excellent news. No fevers, chills, nausea, vomiting, or diarrhea. 05/28/2021 upon evaluation today patient appears to be doing quite well. There does not appear to be any signs of active infection at this time which is great news. Overall I am extremely pleased with where things stand today. I think he is headed in the right direction. 06/11/2021 upon evaluation today patient appears to be doing well with regard to his left ankle ulcer and poorly in regard to the toe ulcer on the second toe right foot. This appears to show signs of joint exposure. Apparently this has been present for 1 to 2 months although he kept forgetting to tell me about it. That is unfortunate as right now it definitely appears to be doing significantly worse than what I would like to see. There does not appear to be any signs of active infection systemically though locally I am concerned about the possibility of infection the toe is quite red. Again no one from the facility ever contacted [place] to advise that  this was going on in the interim either. 06/17/2021 upon evaluation today patient presents for follow-up I did review his x-ray which showed a navicular bone fracture I am unsure of the chronicity of this. Subsequently he also had osteomyelitis of the toe which was what I was more concerned about this did not show up on x-ray but did show up on the pathology scrapings. This was listed as acute osteomyelitis. Nonetheless at this point I think that the antibiotic treatment is the best regimen to go with currently. The patient is in agreement with that plan. Nonetheless he has initially 30 days of doxycycline off likely extend that towards the end of the treatment cycle that will be around the middle of December for an additional 2 weeks. That all depends on how well he continues to heal. Nonetheless based on what I am seeing in the foot I did want a proceed with an MRI as well which I think will be helpful to identify if there is anything else that needs to be addressed from the standpoint of infection. 06/24/2021 upon evaluation today patient appears to be doing pretty well in regards to his wounds. I think both are actually showing signs of improvement which is good I did review his MRI today which did show signs of osteomyelitis of the middle and proximal phalanx on his right foot of the affected toe. With that being said this is actually showing signs of significant improvement today already with the antibiotic therapy I think the redness is also improved. Overall I think that we just need to give this some time with appropriate wound care we will see how things go potentially hyperbarics could be considered. 07/02/2021 upon inspection today patient actually appears to be doing  well in regard to his left ankle which is getting very close to complete resolution of pleased in that regard. Unfortunately he is continuing to have issues with his second toe right foot and this seems to still be very painful  for him. Recommend he try something different from the standpoint of antibiotics. 07/15/2021 upon evaluation today patient appears to be doing actually pretty well in regard to his foot. This is actually showing signs of significant improvement which is great news. Overall I feel like the patient is improving both in regard to the second toe as well as the ankle on the left. With that being said the biggest issue that I do see currently is that he is needing to have a refill of the doxycycline that we previously treated him with. He also did see podiatry they are not going to recommend any amputation at this point since he seems to be doing quite well. For that reason we just need to keep things under control from an infection standpoint. 08/01/2021 upon evaluation today patient appears to be doing well with regard to his wound. He has been tolerating the dressing changes without complication. Fortunately there does not appear to be any evidence of active infection locally nor systemically at this point. In fact I think everything is doing excellent in fact his second toe on the right foot is almost healed and the ankle on the left ankle region is actually very close to being healed as well. 08/08/2021 upon evaluation today patient appears to be doing well with regard to his wound. He has been tolerating the dressing changes without complication. Fortunately I do not see any signs of active infection at this time. Readmission: 12-06-2021 upon evaluation today patient presents for reevaluation here in the clinic he does tell me that he was being seen in facility at [facility] by a provider that was coming in. He is not sure who this was. He tells me however that the wound seems to have gotten worse even compared to where it was when we last saw him at this point. With that being said I do believe that he is likely going need ongoing wound care here in the clinic and I do believe that we need to  be the ones to frontline this since his wound does seem to be getting worse not better at this point. He voiced understanding. He is also in agreement with this plan and feels more comfortable coming here she tells me. Patient's medical history really has not changed since his prior admission he was only gone since January. 12-27-2021 upon evaluation today patient appears to be doing well with regard to his wound they did run out of the [facility] so they did not put anything on just an ABD pad with gentamicin. Still we are seeing some signs of good improvement here with some new epithelization which is great news. 01-10-2022 upon evaluation today patient appears to be doing well with regard to his wounds and he is going require some sharp debridement but overall seems to be making good progress. Fortunately I do not see any evidence of active infection locally or systemically at this time which is great news. 01-24-2022 upon evaluation today patient appears to be doing well with regard to his wound. The facility actually came and dropped him off early and he had another appointment at the hospital and then they just brought him over here and this was still hours before his appointment this afternoon. For  that reason we did do our best to work him in this morning and fortunately had some space to make this happen. With that being said patient's wound does seem to be making progress here and I am very pleased in that regard I do not see any signs of active infection locally or systemically at this time. JAECE, HEREDIA ([id number]) [id number] Page 11 of 15 02-07-2022 patient appears to be doing well currently in regard to his wounds. In fact one of them the more proximal is healed the distal is still open but seems to be doing excellent. Fortunately I do not see any evidence of active infection locally or systemically at this time which is great news. No fevers,  chills, nausea, vomiting, or diarrhea. 7/27; left medial ankle venous. Improving per our intake nurse. We are using Hydrofera Blue under Tubigrip compression. They are changing that at his facility 03-06-2022 upon evaluation today patient appears to be doing well with regard to the his wound he is can require some sharp debridement but seems to be making excellent progress. Fortunately I do not see any evidence of active infection locally or systemically which is great news. 03-27-2022 upon evaluation today patient's wound is actually showing signs of excellent improvement. Fortunately I see no signs of active infection locally or systemically at this time which is great news. No fevers, chills, nausea, vomiting, or diarrhea. 04-21-2022 upon evaluation today patient appears to be doing excellent in regard to his wound in fact this is very close to resolution based on what I am seeing. I do not see any evidence of active infection locally or systemically at this time which is great news and overall I am extremely pleased with where we are today. 05-05-2022 upon evaluation today patient's wound actually appears to potentially be completely healed. Fortunately I do not see any evidence of active infection at this time which is great news and overall I am very pleased I think this needs a little time to toughen up but other than that I really do believe were doing quite well. He is very pleased to hear this its been a long time coming. 05-19-2022 upon evaluation today patient appears to be doing well currently in regard to his wound. He in fact we were hoping will be completely healed and closed but it still has a very tiny area right in the center which is still continuing to drain. Fortunately I do not see any signs of infection locally or systemically which is great news. 11/6; this wound is close to completely" he comes in this week with some erythema at 1 edge of this which looks like something was  rubbing on the area. Other than that no evidence of infection 06-16-2022 upon evaluation today patient appears to be doing well currently in regard to his ankle ulcer. This is very close to being healed but still has a small area in the central portion which is not. Fortunately there does not appear to be any signs of infection locally or systemically which is great news. No fevers, chills, nausea, vomiting, or diarrhea. 06-30-2022 upon evaluation patient's wound pretty much appears to be almost completely closed. In fact it may even be closed but there are still a lot of skin irritation around the edges of the wound and I just do not feel great about releasing them yet I would like to continue to monitor this just a little bit longer before getting to that point. He is not opposed to this and  in fact is happy to continue to come in if need be in order to make sure that things are moving in the right direction he definitely does not want to backtrack. 07-14-2022 upon evaluation today patient appears to be doing well currently in regard to his wound. Has been tolerating the dressing changes without complication. Fortunately I see no evidence of active infection locally nor systemically which is great news. No fevers, chills, nausea, vomiting, or diarrhea. 08-11-2022 upon evaluation today patient appears to be doing a little worse than last time I saw him although it has been a month. Unfortunately he was not able to be seen due to the fact that he was quarantined in the facility with COVID. Subsequently he tells me that he mention to the facility staff several times about taking it off over the past several weeks but they continue to tell him that someone have to get in touch with [place] here although nobody ever did until Friday when the nurse manager called to have that documented in the communication notes. With that being said unfortunately this means that he had a wrap on that he was supposed to have on  for only 1 week for ended up being on four 1 month essentially. I am glad that there is nothing worse than what we see currently to be perfectly honest. 08-19-2022 upon evaluation today patient appears to be doing well currently in regard to his wounds. He has been tolerating the dressing changes without complication. This is actually smaller today which is great news after last week's reopening. Fortunately I do not see any evidence of infection locally nor systemically at this point. 08-26-2022 upon evaluation today patient appears to be doing well currently in regard to his wound. He has been tolerating the dressing changes without complication. Fortunately there does not appear to be any signs of active infection locally nor systemically which is great news. No fevers, chills, nausea, vomiting, or diarrhea. 09-02-2022 upon evaluation today patient's wound actually showing signs of excellent improvement this is measuring smaller and looking much better. I am very pleased with where we stand I do believe that we are headed in the right direction. 09-09-2022 upon evaluation today patient appears to be doing decently well in regard to his leg in general although there was some swelling this is the 1 thing that I am not too happy with based on what I see currently. Fortunately there does not appear to be any signs of infection locally nor systemically at this point. 09-16-2022 upon evaluation today patient appears to be doing well currently in regard to his wound. He is actually showing signs of improvement he wore the Tubigrip this week and it significantly better compared to last week's evaluation. Fortunately I do not see any evidence of active infection locally or systemically which is great news. Overall I think that were headed in the right direction which is great news. In fact overall I think this is the best that he has looked in some time. He does not like the Tubigrip but actually did extremely  well with this in my opinion. I think he needs to continue to utilize this. 09-23-2022 upon evaluation today patient appears to be doing well currently in regard to his wound. He has been tolerating the dressing changes without complication. Fortunately there does not appear to be any signs of active infection locally nor systemically which is great news and in general I do feel like that we are headed in the  right direction. He has been having trouble with the Tubigrip however we have gotten the facility to order compression socks he has not gotten them as of yet. 3/4; patient is about the same with a wound on the left posterior leg. This is a venous wound. He needs more compression on this leg but he took off the previous wraps we are using. I am not certain whether he is using juxta lite stockings at the nursing home. 3/12; patient presents for follow-up. He unfortunately has 2 new wounds 1 to the dorsal left side and the other to the right heel. He is not sure how these happened. The right heel appears to be caused by pressure. He has been using Hydrofera Blue to the original wound on the left leg. 10-14-2022 upon evaluation today patient appears to be doing well currently in regard to his original wound but unfortunately he has several new wounds which I was completely unaware of before walking into the room to see him today. Unfortunately he seems to not be doing nearly as well as what I saw when I last had an appointment with him 3 weeks ago. Fortunately I do not see any signs of systemic infection though unfortunately locally he has definite infection of the left lower extremity. He is currently on doxycycline I am going to likely have to extend that today. 10-21-2022 upon evaluation today patient appears to be doing well currently in regard to his wounds. He seems to be tolerating the dressing changes without complication. Fortunately there does not appear to be any signs of active infection  locally or systemically which is great news and in general I definitely feel like we are on the right track here. 10-30-2022 upon evaluation today patient still continues to not be doing too well at all with regard to his wounds. Unfortunately he is still having signs of infection as well which also has been concerned. Fortunately I do not see any signs of active infection locally nor systemically at this time. No fevers, chills, nausea, vomiting, or diarrhea. Chad [name], Chad [name] ([id number]) [id number] Page 12 of 15 11-06-2022 upon evaluation today patient appears to be doing okay currently in regard to his wounds. All things considered with his blood flow not being nearly as good as what should I think that he is making pretty good progress here. Fortunately I do not see any signs of infection locally nor systemically which is great news. No fevers, chills, nausea, vomiting, or diarrhea. 11-13-2022 upon evaluation today patient appears to be doing poorly currently in regard to his wounds. Things seem to be getting a little bit worse not better. 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 Subsequently he is also going to need to have vascular intervention but we are unsure exactly when this is going on the undertaking. I would try to actually send a message to Dr. Hazle Quant and the vascular team just to see what exactly the plan is going forward. The patient is in agreement with that plan I just think we need to have a plan especially in light of the fact the left leg seems to be getting a little bit worse at this point. 11-25-2022 upon evaluation today patient is currently now discharged from the hospital following his colostomy procedure. That did very well and he seems to be doing well. Fortunately there does not appear to be any signs of active  infection locally or systemically which  is great news. No fevers, chills, nausea, vomiting, or diarrhea. 12-04-2022 upon evaluation today patient appears to be doing actually decently well in regard to his wounds and actually very pleased with where we stand and I think that he is making some pretty good progress here. Fortunately I do not see any signs of active infection locally nor systemically which is great news. No fevers, chills, nausea, vomiting, or diarrhea. 12-12-2022 upon evaluation today patient appears to be doing well currently in regard to his wound. He has been tolerating the dressing changes without complication. The wounds all seem to be making some progress to a degree. He has appointment with vascular on the 23rd of this month. 5/24; the patient was scheduled for a left lower extremity angiogram yesterday but apparently they were overbooked and they wanted to admit him to hospital last night so they could do this early this morning but he refused I see that Dr. Wyn Quaker has rescheduled him for June 6. He is using silver alginate to wounds on his bilateral lower extremities. 01-02-2023 upon evaluation today patient appears to be doing poorly in regard to his wounds still. Subsequently he did have his angiogram but apparently there really was not much that was found that could be revascularized. The patient tells me that Dr. Wyn Quaker was going to contact me here in the office I have not heard from him as of yet and I may see about giving him a call just to follow-up or possibly send a message through epic which could be an option as well. 01-09-2023 upon evaluation today patient appears to be doing poorly in regard to his legs he is extremely swollen. He has been sleeping in his chair he tells me for the past week. His bed is broken and they have ordered a new one which is supposedly supposed to be there today. With that being said he the way he needs to be not sleeping in his chair that is his wheelchair every day and through the night.  This is causing his legs to be much more swollen and it seen and how badly wounds are doing at this point. This has made things significantly worse. Fortunately I do not see any signs of infection right now unfortunately I do believe that the patient has a tremendous amount of drainage currently. 01-19-2023 upon evaluation today patient appears to be doing poorly in regard to his wounds of his legs his swelling is better but still not as good as what we would like to see. Fortunately I do not see any evidence of active infection locally nor systemically which is great news and in general I do believe that we are moving in the right direction here. Nonetheless it would be better if she was in the bed sleeping or not send up in his wheelchair although he seems to still be set up in the wheelchair at this time. He tells me that he is just not as comfortable in the bed as the wheelchair but unfortunately this is causing damage to his legs which I discussed with him today as well. Nonetheless I am not sure that he is going to be amendable to doing anything different. 01-27-2023 upon evaluation today patient appears to be doing well currently. Fortunately I do not see any evidence of active infection locally nor systemically which is great news I think he is actually doing the best option and while in fact I would try to see if  we can do some debridement today to get things moving along. 02-03-2023 upon evaluation today patient's wounds unfortunately are not doing nearly as well. Will be doing the [employer] wraps unfortunately we just not seeing the improvement that I would like to see. Fortunately I do not see any evidence of infection at this time which is good news unfortunately I do think that he needs to be changed on a daily basis due to the amount of drainage otherwise I do not think this is really going to continue to improve dramatically. 02-12-2023 upon evaluation today patient appears to be doing better  in a lot of ways in regard to his wounds and although his toes on the left side actually seem a bit worse. Fortunately I do not see any evidence of active infection locally nor systemically at this time which is good news. With that being said he is having some bleeding from the left leg I think that this is an area that looks like it probably got bumped. With that being said I discussed this with the patient he tells me he does not know of any trauma that occurred but at the same time if was not this that it was skin that got pulled off. Dressing change there is only one of the 2 that could have happened. 7/25; this is a patient who is very frail at [facility]. He is nonambulatory. He has wounds on his bilateral lower legs. However much worse on the left we have been using silver cell to all of his wounds He comes in today and a lot more pain our intake nurse reports deterioration of the left foot. I ended up looking through some of his record. 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 I am really not certain of the stage of this currently or who is following 02-26-2023 upon evaluation today patient unfortunately appears to be doing significantly worse compared to where he was previous. The left foot is showing signs of dramatic worsening in regard to the overall appearance his toes are starting to become necrotic almost in full and he has much more cyanosis present than what I even seen  previous. He is already seeing Dr. Wyn Quaker and unfortunately there was not anything that they saw that was an immediate cause for this but at the same time I feel like this may have changed and even worsened I feel like this could be a vascular event and I believe this is a limb-threatening situation. 03/26/2023; I last saw this patient on 8/1. Wondered about below-knee amputations versus palliative care. 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 The wound has healed fairly well and he follows up with surgery today. He is back today looking at wounds on the right foot and lower leg. This includes 2 areas on the right heel, right dorsal foot right dorsal first and second toes and a small area on the right anterior lower tibia area. He has a lot of pain in this leg as well. The angiogram he had  on 01/01/2023 specific to the right leg now demonstrated a fairly normal common femoral artery, profunda femoris artery and superficial and popliteal arteries without significant disease. 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 No doubt this has a lot to do with the discomfort in the right leg he describes when he is in bed. They have been using Xeroform on the wounds on the right. He has a lot of swelling in this leg with weeping edema MALI, JUAIRE ([id number]) [id number] Page 13 of 15 Objective Constitutional Sitting or standing Blood Pressure is within target range for patient.. Pulse regular and within target range for patient.Marland Kitchen Respirations regular, non-labored and within target range.. Temperature is normal and within the target range for the patient.. He does not look nearly as uncomfortable as he did the last time I saw him.. Vitals Time Taken: 10:07 AM, Height: 69 in, Weight: 150 lbs, BMI: 22.1, Temperature: 97.8 F, Pulse: 93 bpm,  Respiratory Rate: 18 breaths/min, Blood Pressure: 108/71 mmHg. Cardiovascular Pedal pulses are not palpable on the right however his popliteal pulse is palpable.. Pitting edema in the right leg with weeping fluid. There is no edema in the right thigh.. General Notes: Wound exam; he has 2 large wounds at the tip of the right heel and over the right Achilles area. Small wounds on the dorsal right foot dorsal right first toe medial right second toe smaller area on the right anterior mid tibia. None of this looks particularly infected. We looked at his amputation site on the left he still has the sutures in place there is 1 area medially that looks as though it might dehisce but the rest of this looks satisfactory Integumentary (Hair, Skin) Wound #12 status is Amputation. Original cause of wound was Gradually Appeared. The date acquired was: 07/12/2019. The wound has been in treatment 67 weeks. The wound is located on the Left,Distal,Medial Ankle. The wound measures 2.7cm length x 1.3cm width x 0.1cm depth; 2.757cm^2 area and 0.276cm^3 volume. There is a medium amount of serosanguineous drainage noted. Wound #15 status is Amputation. Original cause of wound was Gradually Appeared. The date acquired was: 10/01/2022. The wound has been in treatment 24 weeks. The wound is located on the Left,Dorsal Foot. The wound measures 10.5cm length x 7cm width x 0.4cm depth; 57.727cm^2 area and 23.091cm^3 volume. There is a medium amount of serosanguineous drainage noted. Wound #16 status is Open. Original cause of wound was Gradually Appeared. The date acquired was: 09/26/2022. The wound has been in treatment 24 weeks. The wound is located on the Right Calcaneus. The wound measures 7.5cm length x 3.7cm width x 0.2cm depth; 21.795cm^2 area and 4.359cm^3 volume. There is Fat Layer (Subcutaneous Tissue) exposed. There is a medium amount of serosanguineous drainage noted. There is small (1-33%) red, pink granulation  within the wound bed. There is a large (67-100%) amount of necrotic tissue within the wound bed including Eschar and Adherent Slough. Wound #17 status is Amputation. Original cause of wound was Pressure Injury. The date acquired was: 09/30/2022. The wound has been in treatment 23 weeks. The wound is located on the Left,Anterior Lower Leg. The wound measures 5cm length x 1.7cm width x 0.1cm depth; 6.676cm^2 area and 0.668cm^3 volume. There is a medium amount of serosanguineous drainage noted. Wound #18 status is Open. Original cause of wound was Pressure Injury. The date acquired was: 09/26/2022. The wound has been  in treatment 23 weeks. The wound is located on the Right,Medial T Second. The wound measures 2.4cm length x 1cm width x 0.1cm depth; 1.885cm^2 area and 0.188cm^3 volume. oe There is Fat Layer (Subcutaneous Tissue) exposed. There is a medium amount of serosanguineous drainage noted. There is small (1-33%) red, pink granulation within the wound bed. There is a medium (34-66%) amount of necrotic tissue within the wound bed including Adherent Slough. Wound #19 status is Amputation. Original cause of wound was Gradually Appeared. The date acquired was: 11/10/2022. The wound has been in treatment 19 weeks. The wound is located on the Left,Posterior Lower Leg. The wound measures 2cm length x 2cm width x 0.1cm depth; 3.142cm^2 area and 0.314cm^3 volume. There is a medium amount of serous drainage noted. Wound #20 status is Amputation. Original cause of wound was Pressure Injury. The date acquired was: 02/20/2023. The wound has been in treatment 4 weeks. The wound is located on the Left,Proximal,Medial Foot. The wound measures 1.5cm length x 1.5cm width x 0.1cm depth; 1.767cm^2 area and 0.177cm^3 volume. There is a medium amount of serosanguineous drainage noted. Wound #21 status is Open. Original cause of wound was Pressure Injury. The date acquired was: 03/02/2023. The wound is located on the Right,Dorsal  Foot. The wound measures 0.5cm length x 4.5cm width x 0.1cm depth; 1.767cm^2 area and 0.177cm^3 volume. There is Fat Layer (Subcutaneous Tissue) exposed. There is no tunneling or undermining noted. There is a medium amount of serous drainage noted. There is small (1-33%) red, pink granulation within the wound bed. There is a small (1-33%) amount of necrotic tissue within the wound bed including Adherent Slough. Wound #22 status is Open. Original cause of wound was Pressure Injury. The date acquired was: 03/02/2023. The wound is located on the Right,Anterior Lower Leg. The wound measures 2cm length x 1.5cm width x 0.2cm depth; 2.356cm^2 area and 0.471cm^3 volume. There is Fat Layer (Subcutaneous Tissue) exposed. There is no tunneling or undermining noted. There is a medium amount of serous drainage noted. There is medium (34-66%) red, pink granulation within the wound bed. There is a small (1-33%) amount of necrotic tissue within the wound bed including Adherent Slough. 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 Nurse Visit as needed Chad [name], Chad [name] ([id number]) [id number] Page 14 of 15 Bathing/ Shower/ Hygiene: Clean wound with Normal Saline or wound cleanser. No tub bath. Anesthetic (Use 'Patient Medications' Section for Anesthetic Order Entry): Lidocaine applied to wound bed Edema Control - Lymphedema / Segmental Compressive Device / Other: Elevate legs to the level of the heart and pump ankles as often as possible Elevate leg(s) parallel to  the floor when sitting. Other: - PATIENT MUST SLEEP IN A BED. Legs are swelling due to no elevation of legs. LBKA on 03/03/23 Off-Loading: Heel suspension boot - when in bed WOUND #16: - Calcaneus Wound Laterality: Right Cleanser: Soap and Water 1 x Per Day/30 Days Discharge Instructions: Gently cleanse wound with antibacterial soap, rinse and pat dry prior to dressing wounds Cleanser: Wound Cleanser 1 x Per Day/30 Days Discharge Instructions: Wash your hands with soap and water. Remove old dressing, discard into  plastic bag and place into trash. Cleanse the wound with Wound Cleanser prior to applying a clean dressing using gauze sponges, not tissues or cotton balls. Do not scrub or use excessive force. Pat dry using gauze sponges, not tissue or cotton balls. Prim Dressing: Silvercel Small 2x2 (in/in) 1 x Per Day/30 Days ary Discharge Instructions: Apply Silvercel Small 2x2 (in/in) as instructed Secondary Dressing: ABD Pad 5x9 (in/in) 1 x Per Day/30 Days Discharge Instructions: Cover with ABD pad Secured With: Kerlix Roll Sterile or Non-Sterile 6-ply 4.5x4 (yd/yd) 1 x Per Day/30 Days Discharge Instructions: Apply Kerlix as directed WOUND #18: - T Second Wound Laterality: Right, Medial oe Cleanser: Soap and Water 1 x Per Day/30 Days Discharge Instructions: Gently cleanse wound with antibacterial soap, rinse and pat dry prior to dressing wounds Cleanser: Wound Cleanser 1 x Per Day/30 Days Discharge Instructions: Wash your hands with soap and water. Remove old dressing, discard into plastic bag and place into trash. Cleanse the wound with Wound Cleanser prior to applying a clean dressing using gauze sponges, not tissues or cotton balls. Do not scrub or use excessive force. Pat dry using gauze sponges, not tissue or cotton balls. Prim Dressing: Silvercel Small 2x2 (in/in) 1 x Per Day/30 Days ary Discharge Instructions: Apply Silvercel Small 2x2 (in/in) as instructed Secondary Dressing: ABD Pad  5x9 (in/in) 1 x Per Day/30 Days Discharge Instructions: Cover with ABD pad Secured With: Kerlix Roll Sterile or Non-Sterile 6-ply 4.5x4 (yd/yd) 1 x Per Day/30 Days Discharge Instructions: Apply Kerlix as directed 1. The patient actually looks better after amputation of left leg. This is not surprising 2. Unfortunately on the right side he also has wounds that are probably not going to be able to be healed because of lack of blood flow. This includes the 2 wounds on the posterior right heel smattering of areas on the toes and the dorsal foot. 3. I am not completely sure why he has so much edema in the right leg this does not look like a DVT or infection 4. I think we should use silver alginate as the primary wound dressing here. 5. In view of the blood flow situation I did not think aggressive debridements of especially the heel wounds were indicated Electronic Signature(s) Signed: 03/26/2023 5:20:34 PM By: Baltazar Najjar MD Entered By: Baltazar Najjar on 03/26/2023 08:01:08 -------------------------------------------------------------------------------- SuperBill Details Patient Name: Date of Service: ABDULBASIT, MCCOURY 03/26/2023 Medical Record Number: [id number] Patient Account Number: [id number] Date of Birth/Sex: Treating RN: [birth year] (62 y.o. [name] Primary Care Provider: [name] Other Clinician: Referring Provider: Treating Provider/Extender: Chauncey Mann, MICHA EL Clovis Cao Weeks in Treatment: 67 Diagnosis Coding ICD-10 Codes Code Description [phone] Chronic venous hypertension (idiopathic) with ulcer and inflammation of left lower extremity L98.492 Non-pressure chronic ulcer of skin of other sites with fat layer exposed [name] ([id number]) [id number] Page 15 of 15 L97.518 Non-pressure chronic ulcer of other part of right foot with other specified severity I70.245 Atherosclerosis of native arteries of left leg with ulceration  of other part of foot 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, stage 3 Facility Procedures : CPT4 Code: [id number] Description: 22025 - WOUND CARE VISIT-LEV 5 EST PT Modifier: Quantity: 1 Physician Procedures : CPT4 Code Description Modifier [id number] - WC PHYS LEVEL 4 - EST PT ICD-10 Diagnosis Description L97.518 Non-pressure chronic ulcer of other part of right foot with other specified severity 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, stage 3 L98.492  Non-pressure chronic ulcer of skin  of other sites with fat layer exposed Quantity: 1 Electronic Signature(s) Signed: 03/26/2023 5:47:48 PM By: Midge Aver MSN RN CNS WTA Signed: 03/27/2023 7:45:25 AM By: Baltazar Najjar MD Previous Signature: 03/26/2023 5:20:34 PM Version By: Baltazar Najjar MD Entered By: Midge Aver on 03/26/2023 14:47:47

## 2023-03-27 NOTE — Progress Notes (Signed)
ROPE, BRIETZKE ([id number]) [id number] Page 1 of 19 Visit Report for 03/26/2023 Arrival Information Details Patient Name: Date of Service: Chad [name], Chad [name] 03/26/2023 10:15 A M Medical Record Number: [id number] Patient Account Number: [id number] Date of Birth/Sex: Treating RN: Mar 12, 1961 (62 y.o. Chad [name] Primary Care Seniya Stoffers: [name] Other Clinician: Referring Duwayne Matters: Treating Leonor Darnell/Extender: Chauncey Mann, MICHA EL Rubye Oaks in Treatment: 26 Visit Information History Since Last Visit Added or deleted any medications: No Patient Arrived: Wheel Chair Any new allergies or adverse reactions: No Arrival Time: 09:59 Had a fall or experienced change in No Accompanied By: self activities of daily living that may affect Transfer Assistance: None risk of falls: Patient Identification Verified: Yes Signs or symptoms of abuse/neglect since last visito No Secondary Verification Process Completed: Yes Hospitalized since last visit: Yes Patient Requires Transmission-Based No Has Dressing in Place as Prescribed: Yes Precautions: Pain Present Now: No Patient Has Alerts: Yes Patient Alerts: Patient on Blood Thinner NOT diabetic aspirin 81mg  Lives [place] [facility] ABI R 0.81 11/05/22 ABI L 0.59 11/05/22 [facility] 03/03/23 Electronic Signature(s) Signed: 03/26/2023 10:38:43 AM By: Midge Aver MSN RN CNS WTA Entered By: Midge Aver on 03/26/2023 07:38:43 -------------------------------------------------------------------------------- Clinic Level of Care Assessment Details Patient Name: Date of Service: Chad [name], Chad [name] 03/26/2023 10:15 A M Medical Record Number: [id number] Patient Account Number: [id number] Date of Birth/Sex: Treating RN: 04/07/1961 (62 y.o. Chad [name] Primary Care Joedy Eickhoff: [name] Other Clinician: Referring Dashan Chizmar: Treating Rolf Fells/Extender: RO BSO Dorris Carnes, MICHA EL Clovis Cao Weeks in Treatment: 67 Clinic  Level of Care Assessment Items TOOL 4 Quantity Score X- 1 0 Use when only an EandM is performed on FOLLOW-UP visit ASSESSMENTS - Nursing Assessment / Reassessment Chad [name], Chad [name] ([id number]) [id number] Page 2 of 19 X- 1 10 Reassessment of Co-morbidities (includes updates in patient status) X- 1 5 Reassessment of Adherence to Treatment Plan ASSESSMENTS - Wound and Skin A ssessment / Reassessment []  - 0 Simple Wound Assessment / Reassessment - one wound X- 1 5 Complex Wound Assessment / Reassessment - multiple wounds []  - 0 Dermatologic / Skin Assessment (not related to wound area) ASSESSMENTS - Focused Assessment []  - 0 Circumferential Edema Measurements - multi extremities []  - 0 Nutritional Assessment / Counseling / Intervention []  - 0 Lower Extremity Assessment (monofilament, tuning fork, pulses) []  - 0 Peripheral Arterial Disease Assessment (using hand held doppler) ASSESSMENTS - Ostomy and/or Continence Assessment and Care []  - 0 Incontinence Assessment and Management []  - 0 Ostomy Care Assessment and Management (repouching, etc.) PROCESS - Coordination of Care []  - 0 Simple Patient / Family Education for ongoing care X- 1 20 Complex (extensive) Patient / Family Education for ongoing care X- 1 10 Staff obtains Chiropractor, Records, T Results / Process Orders est []  - 0 Staff telephones HHA, Nursing Homes / Clarify orders / etc []  - 0 Routine Transfer to another Facility (non-emergent condition) []  - 0 Routine Hospital Admission (non-emergent condition) []  - 0 New Admissions / Manufacturing engineer / Ordering NPWT Apligraf, etc. , []  - 0 Emergency Hospital Admission (emergent condition) []  - 0 Simple Discharge Coordination X- 1 15 Complex (extensive) Discharge Coordination PROCESS - Special Needs []  - 0 Pediatric / Minor Patient Management []  - 0 Isolation Patient Management []  - 0 Hearing / Language / Visual special needs []   - 0 Assessment of Community assistance (transportation, D/C planning, etc.) []  - 0 Additional assistance / Altered mentation []  - 0 Support Surface(s) Assessment (bed, cushion, seat,  etc.) INTERVENTIONS - Wound Cleansing / Measurement []  - 0 Simple Wound Cleansing - one wound X- 4 5 Complex Wound Cleansing - multiple wounds X- 1 5 Wound Imaging (photographs - any number of wounds) []  - 0 Wound Tracing (instead of photographs) []  - 0 Simple Wound Measurement - one wound X- 4 5 Complex Wound Measurement - multiple wounds INTERVENTIONS - Wound Dressings X - Small Wound Dressing one or multiple wounds 3 10 X- 1 15 Medium Wound Dressing one or multiple wounds []  - 0 Large Wound Dressing one or multiple wounds []  - 0 Application of Medications - topical []  - 0 Application of Medications - injection Chad [name] ([id number]) [id number] Page 3 of 19 INTERVENTIONS - Miscellaneous []  - 0 External ear exam []  - 0 Specimen Collection (cultures, biopsies, blood, body fluids, etc.) []  - 0 Specimen(s) / Culture(s) sent or taken to Lab for analysis []  - 0 Patient Transfer (multiple staff / Michiel Sites Lift / Similar devices) []  - 0 Simple Staple / Suture removal (25 or less) []  - 0 Complex Staple / Suture removal (26 or more) []  - 0 Hypo / Hyperglycemic Management (close monitor of Blood Glucose) []  - 0 Ankle / Brachial Index (ABI) - do not check if billed separately X- 1 5 Vital Signs Has the patient been seen at the hospital within the last three years: Yes Total Score: 160 Level Of Care: New/Established - Level 5 Electronic Signature(s) Signed: 03/26/2023 5:49:25 PM By: Midge Aver MSN RN CNS WTA Entered By: Midge Aver on 03/26/2023 14:47:35 -------------------------------------------------------------------------------- Encounter Discharge Information Details Patient Name: Date of Service: Chad [name] 03/26/2023 10:15 A M Medical Record  Number: [id number] Patient Account Number: [id number] Date of Birth/Sex: Treating RN: [birth year] (62 y.o. Chad [name] Primary Care Merrianne [name]: [name] Other Clinician: Referring Erion Hermans: Treating Cherity Blickenstaff/Extender: RO BSO Dorris Carnes, MICHA EL Rubye Oaks in Treatment: 71 Encounter Discharge Information Items Discharge Condition: Stable Ambulatory Status: Wheelchair Discharge Destination: Skilled Nursing Facility Telephoned: No Orders Sent: Yes Transportation: Other Accompanied By: self Schedule Follow-up Appointment: Yes Clinical Summary of Care: Electronic Signature(s) Signed: 03/26/2023 5:48:56 PM By: Midge Aver MSN RN CNS WTA Entered By: Midge Aver on 03/26/2023 14:48:56 Chad [name] ([id number]) [id number] Page 4 of 19 -------------------------------------------------------------------------------- Lower Extremity Assessment Details Patient Name: Date of Service: RUDRA, MINER 03/26/2023 10:15 A M Medical Record Number: [id number] Patient Account Number: [id number] Date of Birth/Sex: Treating RN: 10/31/1960 (62 y.o. Chad [name] Primary Care Horris Speros: [name] Other Clinician: Referring Lori Popowski: Treating Brantlee Penn/Extender: RO BSO N, MICHA EL Clovis Cao Weeks in Treatment: 67 Edema Assessment Assessed: [Left: No] [Right: No] Edema: [Left: Ye] [Right: s] Calf Left: Right: Point of Measurement: 33 cm From Medial Instep 39 cm Ankle Left: Right: Point of Measurement: 12 cm From Medial Instep 22.5 cm Vascular Assessment Pulses: Dorsalis Pedis Palpable: [Right:Yes] Extremity colors, hair growth, and conditions: Extremity Color: [Right:Red] Hair Growth on Extremity: [Right:No] Temperature of Extremity: [Right:Warm] Capillary Refill: [Right:< 3 seconds] Dependent Rubor: [Right:No] Blanched when Elevated: [Right:No No] Toe Nail Assessment Left: Right: Thick: Yes Discolored: Yes Deformed: Yes Improper Length and  Hygiene: Yes Electronic Signature(s) Signed: 03/26/2023 10:39:22 AM By: Midge Aver MSN RN CNS WTA Entered By: Midge Aver on 03/26/2023 07:39:21 Chad [name] ([id number]) [id number] Page 5 of 19 -------------------------------------------------------------------------------- Multi Wound Chart Details Patient Name: Date of Service: JERMALL, MAHLBERG 03/26/2023 10:15 A M Medical Record Number: [id number] Patient Account Number: [id number] Date of Birth/Sex: Treating RN: 04/17/1961 (62 y.o. M)  Midge Aver Primary Care Evy Lutterman: [name] Other Clinician: Referring Dayzee Trower: Treating Guinn Delarosa/Extender: RO BSO N, MICHA EL Clovis Cao Weeks in Treatment: 41 Vital Signs Height(in): 69 Pulse(bpm): 93 Weight(lbs): 150 Blood Pressure(mmHg): 108/71 Body Mass Index(BMI): 22.1 Temperature(F): 97.8 Respiratory Rate(breaths/min): 18 [12:Photos: No Photos] [15:No Photos] Left, Distal, Medial Ankle Left, Dorsal Foot Right Calcaneus Wound Location: Gradually Appeared Gradually Appeared Gradually Appeared Wounding Event: Venous Leg Ulcer Venous Leg Ulcer Pressure Ulcer Primary Etiology: N/A N/A Anemia, Chronic Obstructive Comorbid History: Pulmonary Disease (COPD), Coronary Artery Disease, Peripheral Arterial Disease, Peripheral Venous Disease, Hepatitis C, Osteoarthritis, Neuropathy, Received Chemotherapy 07/12/2019 10/01/2022 09/26/2022 Date Acquired: 67 24 24 Weeks of Treatment: Amputation Amputation Open Wound Status: No No No Wound Recurrence: No Yes Yes Clustered Wound: 2.7x1.3x0.1 10.5x7x0.4 7.5x3.7x0.2 Measurements L x W x D (cm) 2.757 57.727 21.795 A (cm) : rea 0.276 23.091 4.359 Volume (cm) : 60.10% -2350.20% -2213.70% % Reduction in Area: 80.00% -9684.30% -2218.60% % Reduction in Volume: Full Thickness Without Exposed Full Thickness Without Exposed Category/Stage III Classification: Support Structures Support Structures Medium  Medium Medium Exudate A mount: Serosanguineous Serosanguineous Serosanguineous Exudate Type: red, brown red, brown red, brown Exudate Color: N/A N/A Small (1-33%) Granulation A mount: N/A N/A Red, Pink Granulation Quality: N/A N/A Large (67-100%) Necrotic A mount: N/A N/A Eschar, Adherent Slough Necrotic Tissue: N/A N/A None Epithelialization: Wound Number: 17 18 19  Photos: No Photos No Photos Left, Anterior Lower Leg Right, Medial T Second oe Left, Posterior Lower Leg Wound Location: Pressure Injury Pressure Injury Gradually Appeared Wounding Event: Pressure Ulcer Pressure Ulcer Venous Leg Ulcer Primary Etiology: N/A Anemia, Chronic Obstructive N/A Comorbid History: Pulmonary Disease (COPD), Coronary Artery Disease, Peripheral Arterial Disease, Peripheral Venous Disease, Hepatitis C, Osteoarthritis, Neuropathy, Received Chemotherapy 09/30/2022 09/26/2022 11/10/2022 Date Acquired: 23 23 19  Weeks of Treatment: Amputation Open Amputation Wound Status: NYSEAN, PRATS ([id number]) [id number] Page 6 of 19 No No No Wound Recurrence: No No Yes Clustered Wound: 5x1.7x0.1 2.4x1x0.1 2x2x0.1 Measurements L x W x D (cm) 6.676 1.885 3.142 A (cm) : rea 0.668 0.188 0.314 Volume (cm) : -61.90% -242.70% -344.40% % Reduction in Area: -62.10% -241.80% -342.30% % Reduction in Volume: Category/Stage III Category/Stage III Full Thickness Without Exposed Classification: Support Structures Medium Medium Medium Exudate A mount: Serosanguineous Serosanguineous Serous Exudate Type: red, brown red, brown amber Exudate Color: N/A Small (1-33%) N/A Granulation Amount: N/A Red, Pink N/A Granulation Quality: N/A Medium (34-66%) N/A Necrotic Amount: N/A Adherent Slough N/A Necrotic Tissue: N/A Fat Layer (Subcutaneous Tissue): Yes N/A Exposed Structures: Fascia: No Tendon: No Muscle: No Joint: No Bone: No N/A None N/A Epithelialization: Wound  Number: 20 21 22  Photos: No Photos Left, Proximal, Medial Foot Right, Dorsal Foot Right, Anterior Lower Leg Wound Location: Pressure Injury Pressure Injury Pressure Injury Wounding Event: Pressure Ulcer Venous Leg Ulcer Venous Leg Ulcer Primary Etiology: N/A Anemia, Chronic Obstructive Anemia, Chronic Obstructive Comorbid History: Pulmonary Disease (COPD), Coronary Pulmonary Disease (COPD), Coronary Artery Disease, Peripheral Arterial Artery Disease, Peripheral Arterial Disease, Peripheral Venous Disease, Disease, Peripheral Venous Disease, Hepatitis C, Osteoarthritis, Hepatitis C, Osteoarthritis, Neuropathy, Received Chemotherapy Neuropathy, Received Chemotherapy 02/20/2023 03/02/2023 03/02/2023 Date Acquired: 4 0 0 Weeks of Treatment: Amputation Open Open Wound Status: No No No Wound Recurrence: No Yes Yes Clustered Wound: 1.5x1.5x0.1 0.5x4.5x0.1 2x1.5x0.2 Measurements L x W x D (cm) 1.767 1.767 2.356 A (cm) : rea 0.177 0.177 0.471 Volume (cm) : 0.00% N/A N/A % Reduction in Area: 0.00% N/A N/A % Reduction in Volume: Unstageable/Unclassified Full Thickness Without Exposed  Full Thickness Without Exposed Classification: Support Structures Support Structures Medium Medium Medium Exudate A mount: Serosanguineous Serous Serous Exudate Type: red, brown amber amber Exudate Color: N/A Small (1-33%) Medium (34-66%) Granulation Amount: N/A Red, Pink Red, Pink Granulation Quality: N/A Small (1-33%) Small (1-33%) Necrotic Amount: N/A Adherent Slough Adherent Slough Necrotic Tissue: N/A Fat Layer (Subcutaneous Tissue): Yes Fat Layer (Subcutaneous Tissue): Yes Exposed Structures: Fascia: No Fascia: No Tendon: No Tendon: No Muscle: No Muscle: No Joint: No Joint: No Bone: No Bone: No N/A Medium (34-66%) Small (1-33%) Epithelialization: Treatment Notes Electronic Signature(s) Signed: 03/26/2023 10:39:28 AM By: Midge Aver MSN RN CNS WTA Entered By: Midge Aver on  03/26/2023 07:39:27 Chad [name] ([id number]) [id number] Page 7 of 19 -------------------------------------------------------------------------------- Multi-Disciplinary Care Plan Details Patient Name: Date of Service: JAQUELLE, ZAMBRANO 03/26/2023 10:15 A M Medical Record Number: [id number] Patient Account Number: [id number] Date of Birth/Sex: Treating RN: 07-Jul-1961 (62 y.o. Chad [name] Primary Care Chere Babson: [name] Other Clinician: Referring Massa Pe: Treating Dereck Agerton/Extender: RO BSO N, MICHA EL Clovis Cao Weeks in Treatment: 74 Active Inactive Venous Leg Ulcer Nursing Diagnoses: Knowledge deficit related to disease process and management Goals: Patient will maintain optimal edema control Date Initiated: 12/06/2021 Target Resolution Date: 04/26/2023 Goal Status: Active Patient/caregiver will verbalize understanding of disease process and disease management Date Initiated: 12/06/2021 Date Inactivated: 12/19/2022 Target Resolution Date: 11/27/2022 Goal Status: Met Interventions: Assess peripheral edema status every visit. Compression as ordered Notes: Electronic Signature(s) Signed: 03/26/2023 5:48:02 PM By: Midge Aver MSN RN CNS WTA Entered By: Midge Aver on 03/26/2023 14:48:01 -------------------------------------------------------------------------------- Pain Assessment Details Patient Name: Date of Service: Chad [name], Chad [name] 03/26/2023 10:15 A M Medical Record Number: [id number] Patient Account Number: [id number] Date of Birth/Sex: Treating RN: 03-01-1961 (62 y.o. Chad [name] Primary Care Jerrie Schussler: [name] Other Clinician: Referring Alyssa Mancera: Treating Bronislaw Switzer/Extender: RO BSO Dorris Carnes, MICHA EL Clovis Cao Weeks in Treatment: 67 Active Problems Location of Pain Severity and Description of Pain Patient Has Delila Pereyra Site Locations [place] ([id number]) [id number] Page 8 of 19 Site  Locations Rate the pain. Current Pain Level: 4 Pain Management and Medication Current Pain Management: Electronic Signature(s) Signed: 03/26/2023 10:39:02 AM By: Midge Aver MSN RN CNS WTA Entered By: Midge Aver on 03/26/2023 07:39:02 -------------------------------------------------------------------------------- Patient/Caregiver Education Details Patient Name: Date of Service: Napoleon, RO [name] 8/29/2024andnbsp10:15 A M Medical Record Number: [id number] Patient Account Number: [id number] Date of Birth/Gender: Treating RN: 1960/09/24 (62 y.o. Chad [name] Primary Care Physician: [name] Other Clinician: Referring Physician: Treating Physician/Extender: RO BSO Dorris Carnes, MICHA EL Rubye Oaks in Treatment: 24 Education Assessment Education Provided To: Patient Education Topics Provided Wound/Skin Impairment: Handouts: Caring for Your Ulcer Methods: Explain/Verbal Responses: State content correctly Electronic Signature(s) Signed: 03/26/2023 5:49:25 PM By: Midge Aver MSN RN CNS WTA Entered By: Midge Aver on 03/26/2023 14:48:12 Chad [name] ([id number]) [id number] Page 9 of 19 -------------------------------------------------------------------------------- Wound Assessment Details Patient Name: Date of Service: Chad [name], Chad [name] 03/26/2023 10:15 A M Medical Record Number: [id number] Patient Account Number: [id number] Date of Birth/Sex: Treating RN: 04/24/1961 (63 y.o. Chad [name] Primary Care Evalisse Prajapati: [name] Other Clinician: Referring Emanii Bugbee: Treating Genever Hentges/Extender: RO BSO N, MICHA EL Clovis Cao Weeks in Treatment: 67 Wound Status Wound Number: 12 Primary Etiology: Venous Leg Ulcer Wound Location: Left, Distal, Medial Ankle Wound Status: Amputation Wounding Event: Gradually Appeared Outcome Level: Below Knee Date Acquired: 07/12/2019 Weeks Of Treatment: 67 Clustered Wound: No Wound Measurements Length:  (cm) 2.7 Width: (cm) 1.3 Depth: (  cm) 0.1 Area: (cm) 2.757 Volume: (cm) 0.276 % Reduction in Area: 60.1% % Reduction in Volume: 80% Wound Description Classification: Full Thickness Without Exposed Support Exudate Amount: Medium Exudate Type: Serosanguineous Exudate Color: red, brown Structures Treatment Notes Wound #12 (Ankle) Wound Laterality: Left, Medial, Distal Cleanser Peri-Wound Care Topical Primary Dressing Secondary Dressing Secured With Compression Wrap Compression Stockings Add-Ons Electronic Signature(s) Signed: 03/26/2023 5:49:25 PM By: Midge Aver MSN RN CNS WTA Entered By: Midge Aver on 03/26/2023 07:22:58 Chad [name] ([id number]) [id number] Page 10 of 19 -------------------------------------------------------------------------------- Wound Assessment Details Patient Name: Date of Service: Chad [name], Chad [name] 03/26/2023 10:15 A M Medical Record Number: [id number] Patient Account Number: [id number] Date of Birth/Sex: Treating RN: October 25, 1960 (62 y.o. Chad [name] Primary Care Brenna Friesenhahn: [name] Other Clinician: Referring Gurman Ashland: Treating Brystol Wasilewski/Extender: RO BSO N, MICHA EL Clovis Cao Weeks in Treatment: 67 Wound Status Wound Number: 15 Primary Etiology: Venous Leg Ulcer Wound Location: Left, Dorsal Foot Wound Status: Amputation Wounding Event: Gradually Appeared Outcome Level: Below Knee Date Acquired: 10/01/2022 Weeks Of Treatment: 24 Clustered Wound: Yes Wound Measurements Length: (cm) 10.5 Width: (cm) 7 Depth: (cm) 0.4 Area: (cm) 57.727 Volume: (cm) 23.091 % Reduction in Area: -2350.2% % Reduction in Volume: -9684.3% Wound Description Classification: Full Thickness Without Exposed Support Exudate Amount: Medium Exudate Type: Serosanguineous Exudate Color: red, brown Structures Treatment Notes Wound #15 (Foot) Wound Laterality: Dorsal, Left Cleanser Peri-Wound Care Topical Primary  Dressing Secondary Dressing Secured With Compression Wrap Compression Stockings Add-Ons Electronic Signature(s) Signed: 03/26/2023 5:49:25 PM By: Midge Aver MSN RN CNS WTA Entered By: Midge Aver on 03/26/2023 07:22:58 Chad [name] ([id number]) [id number] Page 11 of 19 -------------------------------------------------------------------------------- Wound Assessment Details Patient Name: Date of Service: Chad [name], Chad [name] 03/26/2023 10:15 A M Medical Record Number: [id number] Patient Account Number: [id number] Date of Birth/Sex: Treating RN: 1961-07-07 (62 y.o. Chad [name] Primary Care Jylian Pappalardo: [name] Other Clinician: Referring Chales Pelissier: Treating Ziad Maye/Extender: RO BSO N, MICHA EL Clovis Cao Weeks in Treatment: 67 Wound Status Wound Number: 16 Primary Pressure Ulcer Etiology: Wound Location: Right Calcaneus Wound Open Wounding Event: Gradually Appeared Status: Date Acquired: 09/26/2022 Comorbid Anemia, Chronic Obstructive Pulmonary Disease (COPD), Coronary Weeks Of Treatment: 24 History: Artery Disease, Peripheral Arterial Disease, Peripheral Venous Clustered Wound: Yes Disease, Hepatitis C, Osteoarthritis, Neuropathy, Received Chemotherapy Photos Wound Measurements Length: (cm) 7.5 Width: (cm) 3.7 Depth: (cm) 0.2 Area: (cm) 21.795 Volume: (cm) 4.359 % Reduction in Area: -2213.7% % Reduction in Volume: -2218.6% Epithelialization: None Wound Description Classification: Category/Stage III Exudate Amount: Medium Exudate Type: Serosanguineous Exudate Color: red, brown Slough/Fibrino Yes Wound Bed Granulation Amount: Small (1-33%) Exposed Structure Granulation Quality: Red, Pink Fascia Exposed: No Necrotic Amount: Large (67-100%) Fat Layer (Subcutaneous Tissue) Exposed: Yes Necrotic Quality: Eschar, Adherent Slough Tendon Exposed: No Muscle Exposed: No Joint Exposed: No Bone Exposed: No Treatment Notes Wound #16  (Calcaneus) Wound Laterality: Right Cleanser Soap and Water Discharge Instruction: Gently cleanse wound with antibacterial soap, rinse and pat dry prior to dressing wounds Chad [name] ([id number]) [id number] Page 12 of 19 Wound Cleanser Discharge Instruction: Wash your hands with soap and water. Remove old dressing, discard into plastic bag and place into trash. Cleanse the wound with Wound Cleanser prior to applying a clean dressing using gauze sponges, not tissues or cotton balls. Do not scrub or use excessive force. Pat dry using gauze sponges, not tissue or cotton balls. Peri-Wound Care Topical Primary Dressing Silvercel Small 2x2 (in/in) Discharge Instruction: Apply Silvercel Small 2x2 (in/in) as instructed Secondary Dressing ABD  Pad 5x9 (in/in) Discharge Instruction: Cover with ABD pad Secured With Kerlix Roll Sterile or Non-Sterile 6-ply 4.5x4 (yd/yd) Discharge Instruction: Apply Kerlix as directed Compression Wrap Compression Stockings Add-Ons Electronic Signature(s) Signed: 03/26/2023 5:49:25 PM By: Midge Aver MSN RN CNS WTA Entered By: Midge Aver on 03/26/2023 07:31:39 -------------------------------------------------------------------------------- Wound Assessment Details Patient Name: Date of Service: Chad [name] 03/26/2023 10:15 A M Medical Record Number: [id number] Patient Account Number: [id number] Date of Birth/Sex: Treating RN: Nov 20, 1960 (62 y.o. Chad [name] Primary Care Dierdra Salameh: [name] Other Clinician: Referring Athelene Hursey: Treating Senita Corredor/Extender: RO BSO N, MICHA EL Clovis Cao Weeks in Treatment: 67 Wound Status Wound Number: 17 Primary Etiology: Pressure Ulcer Wound Location: Left, Anterior Lower Leg Wound Status: Amputation Wounding Event: Pressure Injury Outcome Level: Below Knee Date Acquired: 09/30/2022 Weeks Of Treatment: 23 Clustered Wound: No Wound Measurements Length: (cm) 5 Width: (cm)  1.7 Depth: (cm) 0.1 Area: (cm) 6.676 Volume: (cm) 0.668 % Reduction in Area: -61.9% % Reduction in Volume: -62.1% Wound Description Classification: Category/Stage III Exudate Amount: Medium Exudate Type: Serosanguineous Exudate Color: red, brown Magel, Markise ([id number]) Treatment Notes Wound #17 (Lower Leg) Wound Laterality: Left, Anterior Cleanser Peri-Wound Care Topical Primary Dressing Secondary Dressing Secured With Compression Wrap Compression Stockings Add-Ons Electronic Signature(s) Signed: 03/26/2023 5:49:25 PM By: Midge Aver MSN RN CNS Entered [id number].pdf Page 13 of 19 WTA By: Midge Aver on 03/26/2023 07:22:59 -------------------------------------------------------------------------------- Wound Assessment Details Patient Name: Date of Service: Chad [name], Chad [name] 03/26/2023 10:15 A M Medical Record Number: [id number] Patient Account Number: [id number] Date of Birth/Sex: Treating RN: 1961/02/21 (62 y.o. Chad [name] Primary Care Alexiz [place]: [name] Other Clinician: Referring Andreas Sobolewski: Treating Mercedies Ganesh/Extender: RO BSO N, MICHA EL Clovis Cao Weeks in Treatment: 67 Wound Status Wound Number: 18 Primary Pressure Ulcer Etiology: Wound Location: Right, Medial T Second oe Wound Open Wounding Event: Pressure Injury Status: Date Acquired: 09/26/2022 Comorbid Anemia, Chronic Obstructive Pulmonary Disease (COPD), Coronary Weeks Of Treatment: 23 History: Artery Disease, Peripheral Arterial Disease, Peripheral Venous Clustered Wound: No Disease, Hepatitis C, Osteoarthritis, Neuropathy, Received Chemotherapy Photos Wound Measurements Length: (cm) 2.4 Width: (cm) 1 Depth: (cm) 0.1 Area: (cm) 1.885 Volume: (cm) 0.188 Clover, Majesty ([id number]) % Reduction in Area: -242.7% % Reduction in Volume: -241.8% Epithelialization: None [id number] Page 14 of 19 Wound Description Classification:  Category/Stage III Exudate Amount: Medium Exudate Type: Serosanguineous Exudate Color: red, brown Foul Odor After Cleansing: No Slough/Fibrino Yes Wound Bed Granulation Amount: Small (1-33%) Exposed Structure Granulation Quality: Red, Pink Fascia Exposed: No Necrotic Amount: Medium (34-66%) Fat Layer (Subcutaneous Tissue) Exposed: Yes Necrotic Quality: Adherent Slough Tendon Exposed: No Muscle Exposed: No Joint Exposed: No Bone Exposed: No Treatment Notes Wound #18 (Toe Second) Wound Laterality: Right, Medial Cleanser Soap and Water Discharge Instruction: Gently cleanse wound with antibacterial soap, rinse and pat dry prior to dressing wounds Wound Cleanser Discharge Instruction: Wash your hands with soap and water. Remove old dressing, discard into plastic bag and place into trash. Cleanse the wound with Wound Cleanser prior to applying a clean dressing using gauze sponges, not tissues or cotton balls. Do not scrub or use excessive force. Pat dry using gauze sponges, not tissue or cotton balls. Peri-Wound Care Topical Primary Dressing Silvercel Small 2x2 (in/in) Discharge Instruction: Apply Silvercel Small 2x2 (in/in) as instructed Secondary Dressing ABD Pad 5x9 (in/in) Discharge Instruction: Cover with ABD pad Secured With Kerlix Roll Sterile or Non-Sterile 6-ply 4.5x4 (yd/yd) Discharge Instruction: Apply Kerlix as directed Compression Wrap Compression Stockings Add-Ons Electronic Signature(s) Signed: 03/26/2023  5:49:25 PM By: Midge Aver MSN RN CNS WTA Entered By: Midge Aver on 03/26/2023 07:32:06 -------------------------------------------------------------------------------- Wound Assessment Details Patient Name: Date of Service: Chad [name], Chad [name] 03/26/2023 10:15 A M Medical Record Number: [id number] Patient Account Number: [id number] Date of Birth/Sex: Treating RN: 1961-07-15 (62 y.o. Chad [name] Primary Care Driana Dazey: [name] Other  Clinician: Referring Abednego Yeates: Treating Kort Stettler/Extender: RO BSO Dorris Carnes, MICHA EL Rubye Oaks in Treatment: [address], [place] ([id number]) [id number] Page 15 of 19 Wound Status Wound Number: 19 Primary Etiology: Venous Leg Ulcer Wound Location: Left, Posterior Lower Leg Wound Status: Amputation Wounding Event: Gradually Appeared Outcome Level: Below Knee Date Acquired: 11/10/2022 Weeks Of Treatment: 19 Clustered Wound: Yes Wound Measurements Length: (cm) 2 Width: (cm) 2 Depth: (cm) 0.1 Area: (cm) 3.142 Volume: (cm) 0.314 % Reduction in Area: -344.4% % Reduction in Volume: -342.3% Wound Description Classification: Full Thickness Without Exposed Support Exudate Amount: Medium Exudate Type: Serous Exudate Color: amber Structures Treatment Notes Wound #19 (Lower Leg) Wound Laterality: Left, Posterior Cleanser Peri-Wound Care Topical Primary Dressing Secondary Dressing Secured With Compression Wrap Compression Stockings Add-Ons Electronic Signature(s) Signed: 03/26/2023 5:49:25 PM By: Midge Aver MSN RN CNS WTA Entered By: Midge Aver on 03/26/2023 07:23:00 -------------------------------------------------------------------------------- Wound Assessment Details Patient Name: Date of Service: Chad [name] 03/26/2023 10:15 A M Medical Record Number: [id number] Patient Account Number: [id number] Date of Birth/Sex: Treating RN: [birth year] (62 y.o. Chad [name] Primary Care Thomasine Klutts: [name] Other Clinician: Referring Micheline Markes: Treating Jesusita Jocelyn/Extender: RO BSO N, MICHA EL Clovis Cao Weeks in Treatment: 67 Wound Status Wound Number: 20 Primary Etiology: Pressure Ulcer Wound Location: Left, Proximal, Medial Foot Wound Status: Amputation Wounding Event: Pressure Injury Outcome Level: Below Knee Date Acquired: 02/20/2023 [facility] Of Treatment: 4 Chad [name] ([id number]) [id number] Page 16  of 19 Clustered Wound: No Wound Measurements Length: (cm) 1.5 Width: (cm) 1.5 Depth: (cm) 0.1 Area: (cm) 1.767 Volume: (cm) 0.177 % Reduction in Area: 0% % Reduction in Volume: 0% Wound Description Classification: Unstageable/Unclassified Exudate Amount: Medium Exudate Type: Serosanguineous Exudate Color: red, brown Treatment Notes Wound #20 (Foot) Wound Laterality: Left, Medial, Proximal Cleanser Peri-Wound Care Topical Primary Dressing Secondary Dressing Secured With Compression Wrap Compression Stockings Add-Ons Electronic Signature(s) Signed: 03/26/2023 5:49:25 PM By: Midge Aver MSN RN CNS WTA Entered By: Midge Aver on 03/26/2023 07:23:00 -------------------------------------------------------------------------------- Wound Assessment Details Patient Name: Date of Service: Chad [name] 03/26/2023 10:15 A M Medical Record Number: [id number] Patient Account Number: [id number] Date of Birth/Sex: Treating RN: 25-Jul-1961 (62 y.o. Chad [name] Primary Care Mayia Megill: [name] Other Clinician: Referring Januel Doolan: Treating Alhassan Everingham/Extender: RO BSO N, MICHA EL Clovis Cao Weeks in Treatment: 67 Wound Status Wound Number: 21 Primary Venous Leg Ulcer Etiology: Wound Location: Right, Dorsal Foot Wound Open Wounding Event: Pressure Injury Status: Date Acquired: 03/02/2023 Comorbid Anemia, Chronic Obstructive Pulmonary Disease (COPD), Coronary Weeks Of Treatment: 0 History: Artery Disease, Peripheral Arterial Disease, Peripheral Venous Clustered Wound: Yes Disease, Hepatitis C, Osteoarthritis, Neuropathy, Received Chemotherapy Photos Chad [name], Chad [name] ([id number]) [id number] Page 17 of 19 Wound Measurements Length: (cm) 0.5 Width: (cm) 4.5 Depth: (cm) 0.1 Area: (cm) 1.767 Volume: (cm) 0.177 % Reduction in Area: % Reduction in Volume: Epithelialization: Medium (34-66%) Tunneling: No Undermining: No Wound  Description Classification: Full Thickness Without Exposed Support Structures Exudate Amount: Medium Exudate Type: Serous Exudate Color: amber Foul Odor After Cleansing: No Slough/Fibrino Yes Wound Bed Granulation Amount: Small (1-33%) Exposed Structure Granulation Quality: Red, Pink Fascia Exposed: No Necrotic Amount: Small (1-33%) Fat Layer (  Subcutaneous Tissue) Exposed: Yes Necrotic Quality: Adherent Slough Tendon Exposed: No Muscle Exposed: No Joint Exposed: No Bone Exposed: No Treatment Notes Wound #21 (Foot) Wound Laterality: Dorsal, Right Cleanser Soap and Water Discharge Instruction: Gently cleanse wound with antibacterial soap, rinse and pat dry prior to dressing wounds Wound Cleanser Discharge Instruction: Wash your hands with soap and water. Remove old dressing, discard into plastic bag and place into trash. Cleanse the wound with Wound Cleanser prior to applying a clean dressing using gauze sponges, not tissues or cotton balls. Do not scrub or use excessive force. Pat dry using gauze sponges, not tissue or cotton balls. Peri-Wound Care Topical Primary Dressing Silvercel Small 2x2 (in/in) Discharge Instruction: Apply Silvercel Small 2x2 (in/in) as instructed Secondary Dressing ABD Pad 5x9 (in/in) Discharge Instruction: Cover with ABD pad Secured With Kerlix Roll Sterile or Non-Sterile 6-ply 4.5x4 (yd/yd) Discharge Instruction: Apply Kerlix as directed Compression Wrap Compression Stockings Add-Ons Electronic Signature(s) Signed: 03/26/2023 5:49:25 PM By: Midge Aver MSN RN CNS WTA Entered By: Midge Aver on 03/26/2023 16:10:96 Chad [name] ([id number]) [id number] Page 18 of 19 -------------------------------------------------------------------------------- Wound Assessment Details Patient Name: Date of Service: ZYGMUND, SIEGMANN 03/26/2023 10:15 A M Medical Record Number: [id number] Patient Account Number: [id number] Date of  Birth/Sex: Treating RN: 1961-07-06 (62 y.o. Chad [name] Primary Care Ilian Wessell: [name] Other Clinician: Referring Nykeria Mealing: Treating Devorah Givhan/Extender: RO BSO N, MICHA EL Clovis Cao Weeks in Treatment: 67 Wound Status Wound Number: 22 Primary Venous Leg Ulcer Etiology: Wound Location: Right, Anterior Lower Leg Wound Open Wounding Event: Pressure Injury Status: Date Acquired: 03/02/2023 Comorbid Anemia, Chronic Obstructive Pulmonary Disease (COPD), Coronary Weeks Of Treatment: 0 History: Artery Disease, Peripheral Arterial Disease, Peripheral Venous Clustered Wound: Yes Disease, Hepatitis C, Osteoarthritis, Neuropathy, Received Chemotherapy Photos Wound Measurements Length: (cm) 2 Width: (cm) 1.5 Depth: (cm) 0.2 Area: (cm) 2.356 Volume: (cm) 0.471 % Reduction in Area: % Reduction in Volume: Epithelialization: Small (1-33%) Tunneling: No Undermining: No Wound Description Classification: Full Thickness Without Exposed Support Structures Exudate Amount: Medium Exudate Type: Serous Exudate Color: amber Foul Odor After Cleansing: No Slough/Fibrino Yes Wound Bed Granulation Amount: Medium (34-66%) Exposed Structure Granulation Quality: Red, Pink Fascia Exposed: No Necrotic Amount: Small (1-33%) Fat Layer (Subcutaneous Tissue) Exposed: Yes Necrotic Quality: Adherent Slough Tendon Exposed: No Muscle Exposed: No Joint Exposed: No Bone Exposed: No Treatment Notes Wound #22 (Lower Leg) Wound Laterality: Right, Anterior Cleanser Soap and Water Discharge Instruction: Gently cleanse wound with antibacterial soap, rinse and pat dry prior to dressing wounds Chad [name] ([id number]) [id number] Page 19 of 19 Wound Cleanser Discharge Instruction: Wash your hands with soap and water. Remove old dressing, discard into plastic bag and place into trash. Cleanse the wound with Wound Cleanser prior to applying a clean dressing using gauze  sponges, not tissues or cotton balls. Do not scrub or use excessive force. Pat dry using gauze sponges, not tissue or cotton balls. Peri-Wound Care Topical Primary Dressing Silvercel Small 2x2 (in/in) Discharge Instruction: Apply Silvercel Small 2x2 (in/in) as instructed Secondary Dressing ABD Pad 5x9 (in/in) Discharge Instruction: Cover with ABD pad Secured With Kerlix Roll Sterile or Non-Sterile 6-ply 4.5x4 (yd/yd) Discharge Instruction: Apply Kerlix as directed Compression Wrap Compression Stockings Add-Ons Electronic Signature(s) Signed: 03/26/2023 5:49:25 PM By: Midge Aver MSN RN CNS WTA Entered By: Midge Aver on 03/26/2023 07:31:03 -------------------------------------------------------------------------------- Vitals Details Patient Name: Date of Service: Chad [name] 03/26/2023 10:15 A M Medical Record Number: [id number] Patient Account Number: [id number] Date of Birth/Sex: Treating RN: [birth year] (62  y.o. Chad [name] Primary Care Fauna Neuner: [name] Other Clinician: Referring Sanel Stemmer: Treating Dragan Tamburrino/Extender: RO BSO N, MICHA EL Clovis Cao Weeks in Treatment: 22 Vital Signs Time Taken: 10:07 Temperature (F): 97.8 Height (in): 69 Pulse (bpm): 93 Weight (lbs): 150 Respiratory Rate (breaths/min): 18 Body Mass Index (BMI): 22.1 Blood Pressure (mmHg): 108/71 Reference Range: 80 - 120 mg / dl Electronic Signature(s) Signed: 03/26/2023 10:38:47 AM By: Midge Aver MSN RN CNS WTA Entered By: Midge Aver on 03/26/2023 07:38:47

## 2023-03-31 ENCOUNTER — Inpatient Hospital Stay: Payer: Medicaid Other | Admitting: Internal Medicine

## 2023-03-31 ENCOUNTER — Inpatient Hospital Stay: Payer: Medicaid Other

## 2023-03-31 ENCOUNTER — Inpatient Hospital Stay: Payer: Medicaid Other | Attending: Internal Medicine

## 2023-03-31 ENCOUNTER — Encounter: Payer: Self-pay | Admitting: Internal Medicine

## 2023-03-31 VITALS — BP 104/72 | HR 90 | Temp 97.2°F | Resp 17 | Ht 67.0 in

## 2023-03-31 DIAGNOSIS — Z1509 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: Secondary | ICD-10-CM | POA: Insufficient documentation

## 2023-03-31 DIAGNOSIS — C661 Malignant neoplasm of right ureter: Secondary | ICD-10-CM | POA: Diagnosis not present

## 2023-03-31 DIAGNOSIS — D509 Iron deficiency anemia, unspecified: Secondary | ICD-10-CM | POA: Insufficient documentation

## 2023-03-31 DIAGNOSIS — Z8051 Family history of malignant neoplasm of kidney: Secondary | ICD-10-CM | POA: Insufficient documentation

## 2023-03-31 DIAGNOSIS — C679 Malignant neoplasm of bladder, unspecified: Secondary | ICD-10-CM | POA: Insufficient documentation

## 2023-03-31 DIAGNOSIS — C187 Malignant neoplasm of sigmoid colon: Secondary | ICD-10-CM | POA: Diagnosis present

## 2023-03-31 DIAGNOSIS — F17210 Nicotine dependence, cigarettes, uncomplicated: Secondary | ICD-10-CM | POA: Insufficient documentation

## 2023-03-31 DIAGNOSIS — Z9221 Personal history of antineoplastic chemotherapy: Secondary | ICD-10-CM | POA: Insufficient documentation

## 2023-03-31 DIAGNOSIS — Z8042 Family history of malignant neoplasm of prostate: Secondary | ICD-10-CM | POA: Diagnosis not present

## 2023-03-31 DIAGNOSIS — Z89512 Acquired absence of left leg below knee: Secondary | ICD-10-CM | POA: Diagnosis not present

## 2023-03-31 DIAGNOSIS — I739 Peripheral vascular disease, unspecified: Secondary | ICD-10-CM | POA: Diagnosis not present

## 2023-03-31 DIAGNOSIS — Z95828 Presence of other vascular implants and grafts: Secondary | ICD-10-CM

## 2023-03-31 DIAGNOSIS — Z806 Family history of leukemia: Secondary | ICD-10-CM | POA: Insufficient documentation

## 2023-03-31 DIAGNOSIS — Z79899 Other long term (current) drug therapy: Secondary | ICD-10-CM | POA: Insufficient documentation

## 2023-03-31 LAB — CMP (CANCER CENTER ONLY)
ALT: 21 U/L (ref 0–44)
AST: 30 U/L (ref 15–41)
Albumin: 2.8 g/dL — ABNORMAL LOW (ref 3.5–5.0)
Alkaline Phosphatase: 88 U/L (ref 38–126)
Anion gap: 5 (ref 5–15)
BUN: 22 mg/dL (ref 8–23)
CO2: 21 mmol/L — ABNORMAL LOW (ref 22–32)
Calcium: 8.3 mg/dL — ABNORMAL LOW (ref 8.9–10.3)
Chloride: 110 mmol/L (ref 98–111)
Creatinine: 0.84 mg/dL (ref 0.61–1.24)
GFR, Estimated: >60 mL/min (ref >60)
Glucose, Bld: 103 mg/dL — ABNORMAL HIGH (ref 70–99)
Potassium: 4 mmol/L (ref 3.5–5.1)
Sodium: 136 mmol/L (ref 135–145)
Total Bilirubin: 0.3 mg/dL (ref 0.3–1.2)
Total Protein: 6.5 g/dL (ref 6.5–8.1)

## 2023-03-31 LAB — CBC WITH DIFFERENTIAL (CANCER CENTER ONLY)
Abs Immature Granulocytes: 0.03 10*3/uL (ref 0.00–0.07)
Basophils Absolute: 0.1 10*3/uL (ref 0.0–0.1)
Basophils Relative: 1 %
Eosinophils Absolute: 0.4 10*3/uL (ref 0.0–0.5)
Eosinophils Relative: 5 %
HCT: 31.8 % — ABNORMAL LOW (ref 39.0–52.0)
Hemoglobin: 9.9 g/dL — ABNORMAL LOW (ref 13.0–17.0)
Immature Granulocytes: 0 %
Lymphocytes Relative: 15 %
Lymphs Abs: 1.2 10*3/uL (ref 0.7–4.0)
MCH: 27.9 pg (ref 26.0–34.0)
MCHC: 31.1 g/dL (ref 30.0–36.0)
MCV: 89.6 fL (ref 80.0–100.0)
Monocytes Absolute: 0.5 10*3/uL (ref 0.1–1.0)
Monocytes Relative: 6 %
Neutro Abs: 5.8 10*3/uL (ref 1.7–7.7)
Neutrophils Relative %: 73 %
Platelet Count: 218 10*3/uL (ref 150–400)
RBC: 3.55 MIL/uL — ABNORMAL LOW (ref 4.22–5.81)
RDW: 16.3 % — ABNORMAL HIGH (ref 11.5–15.5)
WBC Count: 8 10*3/uL (ref 4.0–10.5)
nRBC: 0 % (ref 0.0–0.2)

## 2023-03-31 LAB — TSH: TSH: 1.16 u[IU]/mL (ref 0.350–4.500)

## 2023-03-31 LAB — T4, FREE: Free T4: 1.02 ng/dL (ref 0.61–1.12)

## 2023-03-31 MED ORDER — HEPARIN SOD (PORK) LOCK FLUSH 100 UNIT/ML IV SOLN
500.0000 [IU] | Freq: Once | INTRAVENOUS | Status: AC
  Administered 2023-03-31: 500 [IU]
  Filled 2023-03-31: qty 5

## 2023-03-31 MED ORDER — SODIUM CHLORIDE 0.9% FLUSH
10.0000 mL | Freq: Once | INTRAVENOUS | Status: AC
  Administered 2023-03-31: 10 mL via INTRAVENOUS
  Filled 2023-03-31: qty 10

## 2023-03-31 NOTE — Progress Notes (Signed)
Pt has had a Left  BKA since we last saw him in clinic. Has a large boot on his Right. He is back at [facility]. A/O. Appetite is good. Enjoying his food. Denies dizziness. PT working with patient. 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 Has bruising and skin fragility. Pain is well controlled with pain meds around the clock.

## 2023-03-31 NOTE — Assessment & Plan Note (Addendum)
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 On keytruda- APRIL 9th, CT CAP [ER]No definite acute abnormality within the abdomen or pelvis.  Right renal/ureteral findings are similar to the recent prior CT. There is moderate right hydronephrosis with infiltrative appearing soft tissue adjacent to the right renal pelvis and proximal ureter. A right ureteral stent is well positioned extending from the right renal collecting system to the posterior bladder.  Posterior bladder wall irregularity. Dependent 1.5 cm bladder stone. These findings are also similar to the prior CT.   # Marjory Sneddon for now [given acute issues].  Labs today reviewed; Stable TSH APRIL 2024--WNL-Will order CT CAP today  # PVD/ Left LE cellulitis- on antibiotics [would care]-  S/P LLE- BKA- on Aug 2024 [Dr.Schneir]- continue follow up with vascular/ wound care. Stable.   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 - ELEVEN LYMPH NODES, NEGATIVE FOR MALIGNANCY (0/11). - SURGICAL MARGINS FREE OF DYSPLASIA AND MALIGNANCY. S/p staple removal.-- STABLE.   # Iron deficiency anemia-hemoglobin 11-12; FEB - iron studies-10%; ferritin-27 -hemoglobin 12.7- s/p  Venofer-recheck iron studies-  stable.   # Hypocalcemia- Mild- on Ca+vit D; G+FEB 2024- 42. Stable  # Social worker re: Youth worker  # active smoker:  Counseled against smoking; patient-not interested in quitting.   # IV access: port access   #DISPOSITION: # follow up in 1 months- MD- port/labs-cbc/cmp;iron studies; ferritin; possible venofer;NO chemo- prior-CT CAP prior- -Dr.B.

## 2023-03-31 NOTE — Progress Notes (Signed)
[facility] CONSULT NOTE  Patient Care Team: Everardo Beals, MD as PCP - General (Family Medicine) Earna Coder, MD as Consulting Physician (Internal Medicine) Lemar Livings Merrily Pew, MD as Consulting Physician (General Surgery)  CHIEF COMPLAINTS/PURPOSE OF CONSULTATION: Urothelial cancer  #  Oncology History Overview Note  # SEP-OCT 2019-right renal pelvis/ ureteral [cytology positive HIGH grade urothelial carcinoma [Dr.Brandon]    # NOV 24th 2019-Keytruda [consent]  # April 2022- colonoscopy [Dr.Anna;incidental PET- sigmoid uptake] # Sigmoid colon cancer- [history of Lynch syndrome]- [Dr.Cintron; APRIL 2024]- s/p hemicolectomy- permanent ostomy; MUCINOUS ADENOCARCINOMA, 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  - ELEVEN LYMPH NODES, NEGATIVE FOR MALIGNANCY (0/11).  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  Is alone/in a wheelchair.  Chad [name] 62 y.o.  male with Lynch syndrome and above 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; recurrent sigmoid colon cancer -stage II and multiple other  comorbidities including PVD/active smoker currently on Keytruda is here for follow-up.  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 Chronic pain in legs. Working with PT.   In the interim patient underwent partial colectomy/surgery for sigmoid cancer.     Denies any nausea. No diarrhea. Feels some fatigue.  Denies any worsening abdominal pain.  No blood in stools or black-colored stools.  No hematuria.  Chronic shortness of breath.  Not any worse.   Review of Systems  Constitutional:  Positive for malaise/fatigue. Negative for chills, diaphoresis, fever and weight loss.  HENT:  Negative for nosebleeds and sore throat.   Eyes:  Negative for double vision.  Respiratory:  Positive for cough and shortness of breath. Negative for hemoptysis and wheezing.   Cardiovascular:  Negative for chest pain, palpitations and orthopnea.  Gastrointestinal:  Positive for constipation. Negative for abdominal pain, blood in stool, diarrhea, heartburn, melena, nausea and vomiting.  Genitourinary:  Negative for dysuria, frequency and urgency.  Musculoskeletal:  Positive for back pain and joint pain.  Skin: Negative.  Negative for itching and rash.  Neurological:  Positive for focal weakness. Negative for dizziness, tingling, weakness and headaches.       Chronic left-sided weakness upper than lower extremity.  Endo/Heme/Allergies:  Does not bruise/bleed easily.  Psychiatric/Behavioral:  Negative for depression. The patient is not nervous/anxious and does not have insomnia.      MEDICAL HISTORY:  Past Medical History:  Diagnosis Date   Anemia    Anxiety    ARF (acute respiratory failure) (HCC)    Atherosclerosis of arteries of extremities (HCC)    Benign prostatic hyperplasia with urinary obstruction    Bladder  cancer [facility])    BPH with obstruction/lower urinary tract symptoms    Chronic viral hepatitis C (HCC)    Colon cancer (HCC)    COPD (chronic obstructive pulmonary disease) (HCC)     Depression    Dysphagia    Family history of colon cancer    Family history of kidney cancer    Family history of leukemia    Family history of prostate cancer    Generalized anxiety disorder    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    GERD (gastroesophageal reflux disease)    Hepatitis    chronic hep c   Hydronephrosis    Hydronephrosis with ureteral stricture    Hyperlipidemia    Insomnia, unspecified    Knee pain    Left   Major depressive disorder, recurrent, moderate (HCC)    Malignant neoplasm of colon (HCC)    Malnutrition (HCC)    Muscle weakness (generalized)    Nerve pain    Other abnormalities of gait and mobility    Other chronic pain    Peripheral vascular disease (HCC)    Personal history of transient cerebral ischemia    Pressure ulcer    Prostate cancer (HCC)    Stroke (HCC)    Tinea unguium    Tobacco user    Unspecified protein-calorie malnutrition (HCC)    Ureteral cancer, right (HCC)    Urinary frequency    Venous hypertension of both lower extremities    Xerosis cutis     SURGICAL HISTORY: Past Surgical History:  Procedure Laterality Date   AMPUTATION Left 03/03/2023   Procedure: AMPUTATION BELOW KNEE;  Surgeon: Renford Dills, MD;  Location: [facility] ORS;  Service: Vascular;  Laterality: Left;   COLON SURGERY     En bloc extended right hemicolectomy 07/2017   COLONOSCOPY WITH PROPOFOL N/A 11/06/2020   Procedure: COLONOSCOPY WITH PROPOFOL;  Surgeon: Wyline Mood, MD;  Location: [facility] END[facility]COPY;  Service: Gastroenterology;  Laterality: N/A;   COLONOSCOPY WITH PROPOFOL N/A 07/31/2021   Procedure: COLONOSCOPY WITH PROPOFOL;  Surgeon: Earline Mayotte, MD;  Location: [facility] ENDOSCOPY;  Service: Endoscopy;  Laterality: N/A;   CYSTOSCOPY W/ RETROGRADES Right 08/30/2018   Procedure: CYSTOSCOPY WITH RETROGRADE PYELOGRAM;  Surgeon: Vanna Scotland, MD;  Location: [facility];  Service: Urology;  Laterality: Right;   CYSTOSCOPY WITH STENT  PLACEMENT Right 04/25/2018   Procedure: CYSTOSCOPY WITH STENT PLACEMENT;  Surgeon: Vanna Scotland, MD;  Location: [facility];  Service: Urology;  Laterality: Right;   CYSTOSCOPY WITH STENT PLACEMENT Right 08/30/2018   Procedure: CYSTOSCOPY WITH STENT Exchange;  Surgeon: Vanna Scotland, MD;  Location: [facility];  Service: Urology;  Laterality: Right;   CYSTOSCOPY WITH STENT PLACEMENT Right 03/07/2019   Procedure: CYSTOSCOPY WITH STENT Exchange;  Surgeon: Vanna Scotland, MD;  Location: [facility];  Service: Urology;  Laterality: Right;   CYSTOSCOPY WITH STENT PLACEMENT Right 11/21/2019   Procedure: CYSTOSCOPY WITH STENT Exchange;  Surgeon: Vanna Scotland, MD;  Location: [facility];  Service: Urology;  Laterality: Right;   LOWER EXTREMITY ANGIOGRAPHY Left 05/23/2019   Procedure: LOWER EXTREMITY ANGIOGRAPHY;  Surgeon: Annice Needy, MD;  Location: [facility];  Service: Cardiovascular;  Laterality: Left;   LOWER EXTREMITY ANGIOGRAPHY Right 05/30/2019   Procedure: LOWER EXTREMITY ANGIOGRAPHY;  Surgeon: Annice Needy, MD;  Location: [facility];  Service: Cardiovascular;  Laterality: Right;   LOWER EXTREMITY ANGIOGRAPHY Right 02/13/2020   Procedure: LOWER EXTREMITY ANGIOGRAPHY;  Surgeon: Annice Needy, MD;  Location:  [facility];  Service: Cardiovascular;  Laterality: Right;   LOWER EXTREMITY ANGIOGRAPHY Left 02/20/2020   Procedure: LOWER EXTREMITY ANGIOGRAPHY;  Surgeon: Annice Needy, MD;  Location: [facility];  Service: Cardiovascular;  Laterality: Left;   LOWER EXTREMITY ANGIOGRAPHY Left 01/01/2023   Procedure: Lower Extremity Angiography;  Surgeon: Annice Needy, MD;  Location: [facility];  Service: Cardiovascular;  Laterality: Left;   PORTA CATH INSERTION N/A 02/28/2019   Procedure: PORTA CATH INSERTION;  Surgeon: Annice Needy, MD;  Location: [facility];  Service: Cardiovascular;  Laterality: N/A;   ROBOT ASSISTED LAPAROSCOPIC PARTIAL COLECTOMY   11/17/2022   tumor removed       SOCIAL HISTORY: Social History   Socioeconomic History   Marital status: Single    Spouse name: Not on file   Number of children: Not on file   Years of education: Not on file   Highest education level: Not on file  Occupational History   Not on file  Tobacco Use   Smoking status: Every Day    Current packs/day: 0.50    Types: Cigarettes   Smokeless tobacco: Never  Vaping Use   Vaping status: Never Used  Substance and Sexual Activity   Alcohol use: Not Currently   Drug use: Not Currently    Types: Marijuana, Methylphenidate    Comment: quit 1997-1998 ish   Sexual activity: Not Currently  Other Topics Concern   Not on file  Social History Narrative    [place] live [place]; [place]  [place]- end of April 2019; in Nursing home; 1pp/day; quit alcohol. 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        Family- dad- prostate ca [at 78y]; brother- 18 died of prostate cancer; brother- 70- no cancers [[place]]; sonThayer Ohm [[place]];[name]-32y prostate ca [facility] [place] daughter- 49 [[place]]; another daughter 45 [[place]/addict]. will refer genetics counseling. Given MSI- abnormal; highly suspicious of Lynch syndrome.  Patient's son Cristal Deer aware of high possible lynch syndrome.   Social Determinants of Health   Financial Resource Strain: Not on file  Food Insecurity: No Food Insecurity (02/26/2023)   Hunger Vital Sign    Worried About Running Out of Food in the Last Year: Never true    Ran Out of Food in the Last Year: Never true  Transportation Needs: No Transportation Needs (02/26/2023)   PRAPARE - Administrator, Civil Service (Medical): No    Lack of Transportation (Non-Medical): No  Physical Activity: Not on file  Stress: Not on file  Social Connections: Not on file  Intimate Partner Violence: Not At Risk (02/26/2023)   Humiliation, Afraid, Rape, and Kick questionnaire    Fear of Current or Ex-Partner: No    Emotionally Abused: No    Physically Abused: No     Sexually Abused: No    FAMILY HISTORY: Family History  Problem Relation Age of Onset   Prostate cancer Father 24   Cancer Brother 33       unsure type   Cancer Paternal Uncle        unsure type   Cancer Maternal Grandmother        unsure type   Cancer Paternal Grandmother        unsure type   Kidney cancer Paternal Grandfather    Cancer Other        unsure types   Leukemia Son    Cancer Son  other cancers, possibly colon    ALLERGIES:  is allergic to penicillins.  MEDICATIONS:  Current Outpatient Medications  Medication Sig Dispense Refill   acetaminophen (TYLENOL) 325 MG tablet Take 650 mg by mouth 3 (three) times daily.      Amino Acids-Protein Hydrolys (PRO-STAT) LIQD Take 45 mLs by mouth daily.     ascorbic acid (VITAMIN C) 500 MG tablet Take 1 tablet (500 mg total) by mouth 2 (two) times daily.     aspirin EC 81 MG tablet Take 1 tablet (81 mg total) by mouth daily. 150 tablet 2   atorvastatin (LIPITOR) 10 MG tablet Take 1 tablet (10 mg total) by mouth daily. 30 tablet 11   Calcium Carb-Cholecalciferol (CALCIUM CARBONATE-VITAMIN D3 PO) Take 2 tablets by mouth daily. 600-400 mg     clopidogrel (PLAVIX) 75 MG tablet Take 75 mg by mouth daily.     cyclobenzaprine (FEXMID) 7.5 MG tablet Take 15 mg by mouth at bedtime.     escitalopram (LEXAPRO) 5 MG tablet Take 5 mg by mouth daily.     ferrous fumarate (FERRETTS) 325 (106 Fe) MG TABS tablet Take 1 tablet by mouth.     gabapentin (NEURONTIN) 100 MG capsule Take 100 mg by mouth 3 (three) times daily.     ibuprofen (ADVIL) 400 MG tablet Take 1 tablet (400 mg total) by mouth every 6 (six) hours as needed for mild pain or moderate pain.     mirtazapine (REMERON) 7.5 MG tablet Take 7.5 mg by mouth at bedtime.     Multiple Vitamin (MULTIVITAMIN) tablet Take 1 tablet by mouth daily.     oxyCODONE 10 MG TABS Take 1-1.5 tablets (10-15 mg total) by mouth every 4 (four) hours as needed for severe pain or moderate pain. 30 tablet  0   Polyethyl Glycol-Propyl Glycol (SYSTANE) 0.4-0.3 % SOLN Apply to eye.     potassium chloride SA (KLOR-CON M) 20 MEQ tablet Take 2 tablets (40 mEq total) by mouth daily.     senna-docusate (SENOKOT-S) 8.6-50 MG tablet Take 1 tablet by mouth daily.     tamsulosin (FLOMAX) 0.4 MG CAPS capsule Take 1 capsule (0.4 mg total) by mouth daily after supper. 30 capsule 3   traMADol (ULTRAM) 50 MG tablet Take 1 tablet (50 mg total) by mouth every 6 (six) hours as needed. 20 tablet 0   leptospermum manuka honey (MEDIHONEY) PSTE paste Apply 1 Application topically daily.     No current facility-administered medications for this visit.   Facility-Administered Medications Ordered in Other Visits  Medication Dose Route Frequency Provider Last Rate Last Admin   heparin lock flush 100 UNIT/ML injection               .  PHYSICAL EXAMINATION: ECOG PERFORMANCE STATUS: 1 - Symptomatic but completely ambulatory  Vitals:   03/31/23 0918  BP: 104/72  Pulse: 90  Resp: 17  Temp: (!) 97.2 F (36.2 C)     Filed Weights   Left lower extremity- s/p BKA.   # R LE- swelling/ in wrap.   Physical Exam HENT:     Head: Normocephalic and atraumatic.     Mouth/Throat:     Pharynx: No oropharyngeal exudate.  Eyes:     Pupils: Pupils are equal, round, and reactive to light.  Cardiovascular:     Rate and Rhythm: Normal rate and regular rhythm.  Pulmonary:     Effort: No respiratory distress.     Breath sounds: No wheezing.  Abdominal:  General: Bowel sounds are normal. There is no distension.     Palpations: Abdomen is soft. There is no mass.     Tenderness: There is no abdominal tenderness. There is no guarding or rebound.  Musculoskeletal:        General: No tenderness. Normal range of motion.     Cervical back: Normal range of motion and neck supple.  Skin:    General: Skin is warm.     Comments: Bilateral lower extremity ulcerations noted.  Pulses intact bilaterally.  Cystlike lesion  noted right elbow-nontender.  No erythema noted.  Neurological:     Mental Status: He is oriented to person, place, and time.     Comments: Sleepy but easily arousable.  Chronic weakness left upper and lower extremity.  Psychiatric:        Mood and Affect: Affect normal.      LABORATORY DATA:  I have reviewed the data as listed Lab Results  Component Value Date   WBC 8.0 03/31/2023   HGB 9.9 (L) 03/31/2023   HCT 31.8 (L) 03/31/2023   MCV 89.6 03/31/2023   PLT 218 03/31/2023   Recent Labs    02/26/23 1537 02/27/23 0641 03/01/23 0433 03/02/23 0450 03/05/23 1008 03/06/23 0528 03/31/23 0915  NA 136   < > 138   < > 138 140 136  K 4.0   < > 3.6   < > 3.4* 3.4* 4.0  CL 106   < > 114*   < > 112* 113* 110  CO2 19*   < > 19*   < > 21* 21* 21*  GLUCOSE 101*   < > 89   < > 126* 106* 103*  BUN 38*   < > 15   < > 9 10 22   CREATININE 1.17   < > 0.85   < > 0.84 0.69 0.84  CALCIUM 8.8*   < > 7.9*   < > 7.9* 8.1* 8.3*  GFRNONAA >60   < > >60   < > >60 >60 >60  PROT 8.1  --  6.7  --   --   --  6.5  ALBUMIN 3.1*  --  2.6*  --   --   --  2.8*  AST 32  --  39  --   --   --  30  ALT 16  --  18  --   --   --  21  ALKPHOS 88  --  67  --   --   --  88  BILITOT 1.0  --  0.6  --   --   --  0.3   < > = values in this interval not displayed.    ASSESSMENT & PLAN:   Ureteral cancer, right (HCC) # High-grade urothelial cancer/cytology; likely of the right renal pelvis /upper ureter. On keytruda- APRIL 9th, CT CAP [ER]No definite acute abnormality within the abdomen or pelvis.  Right renal/ureteral findings are similar to the recent prior CT. There is moderate right hydronephrosis with infiltrative appearing soft tissue adjacent to the right renal pelvis and proximal ureter. A right ureteral stent is well positioned extending from the right renal collecting system to the posterior bladder.  Posterior bladder wall irregularity. Dependent 1.5 cm bladder stone. These findings are also similar to the  prior CT.   # Marjory Sneddon for now [given acute issues].  Labs today reviewed; Stable TSH APRIL 2024--WNL-Will order CT CAP today  # PVD/ Left LE cellulitis- on  antibiotics [would care]-  S/P LLE- BKA- on Aug 2024 [Dr.Schneir]- continue follow up with vascular/ wound care. Stable.   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 - ELEVEN LYMPH NODES, NEGATIVE FOR MALIGNANCY (0/11). - SURGICAL MARGINS FREE OF DYSPLASIA AND MALIGNANCY. S/p staple removal.-- STABLE.   # Iron deficiency anemia-hemoglobin 11-12; FEB - iron studies-10%; ferritin-27 -hemoglobin 12.7- s/p  Venofer-recheck iron studies-  stable.   # Hypocalcemia- Mild- on Ca+vit D; G+FEB 2024- 42. Stable  # Social worker re: Youth worker  # active smoker:  Counseled against smoking; patient-not interested in quitting.   # IV access: port access   #DISPOSITION: # follow up in 1 months- MD- port/labs-cbc/cmp;iron studies; ferritin; possible venofer;NO chemo- prior-CT CAP prior- -Dr.B.    All questions were answered. The patient knows to call the clinic with any problems, questions or concerns.    Earna Coder, MD 03/31/2023 10:00 AM

## 2023-04-09 ENCOUNTER — Encounter: Payer: Medicaid Other | Attending: Physician Assistant | Admitting: Physician Assistant

## 2023-04-09 DIAGNOSIS — J449 Chronic obstructive pulmonary disease, unspecified: Secondary | ICD-10-CM | POA: Diagnosis not present

## 2023-04-09 DIAGNOSIS — L89613 Pressure ulcer of right heel, stage 3: Secondary | ICD-10-CM | POA: Insufficient documentation

## 2023-04-09 DIAGNOSIS — B1920 Unspecified viral hepatitis C without hepatic coma: Secondary | ICD-10-CM | POA: Diagnosis not present

## 2023-04-09 DIAGNOSIS — L98492 Non-pressure chronic ulcer of skin of other sites with fat layer exposed: Secondary | ICD-10-CM | POA: Diagnosis not present

## 2023-04-09 DIAGNOSIS — I87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 hypertension (idiopathic) with ulcer and inflammation of left lower extremity: Secondary | ICD-10-CM | POA: Diagnosis present

## 2023-04-09 DIAGNOSIS — I69354 Hemiplegia and hemiparesis following cerebral infarction affecting left non-dominant side: Secondary | ICD-10-CM | POA: Insufficient documentation

## 2023-04-09 DIAGNOSIS — I70245 Atherosclerosis of native arteries of left leg with ulceration of other part of foot: Secondary | ICD-10-CM | POA: Insufficient documentation

## 2023-04-09 DIAGNOSIS — L97512 Non-pressure chronic ulcer of other part of right foot with fat layer exposed: Secondary | ICD-10-CM | POA: Diagnosis not present

## 2023-04-09 DIAGNOSIS — I872 Venous insufficiency (chronic) (peripheral): Secondary | ICD-10-CM | POA: Insufficient documentation

## 2023-04-09 DIAGNOSIS — Z933 Colostomy status: Secondary | ICD-10-CM | POA: Diagnosis not present

## 2023-04-09 DIAGNOSIS — L97518 Non-pressure chronic ulcer of other part of right foot with other specified severity: Secondary | ICD-10-CM | POA: Diagnosis not present

## 2023-04-09 NOTE — Progress Notes (Addendum)
Dressing: ABD Pad 5x9 (in/in) 1 x Per Day/30 Days Discharge Instructions: Cover with ABD pad Secured With: Kerlix Roll Sterile or Non-Sterile 6-ply 4.5x4 (yd/yd) 1 x Per Day/30 Days Discharge Instructions: Apply Kerlix as directed Electronic Signature(s) Signed: 04/09/2023 4:31:22 PM By: [name] PA-C Signed: 04/10/2023 12:41:22 PM By: Midge Aver MSN RN CNS WTA Entered By: Midge Aver on 04/09/2023  14:56:34 -------------------------------------------------------------------------------- Problem List Details Patient Name: Date of Service: Chad [name], Chad [name] 04/09/2023 10:15 A M Medical Record Number: [id number] Patient Account Number: [id number] Date of Birth/Sex: Treating RN: 06-04-1961 (62 y.o. 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 02/19/2023 No Yes foot 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, stage 3 10/07/2022 No Yes Inactive Problems Chad [name], Chad [name] ([id number]) [id number] Page 9 of 16 ICD-10 Code Description Active Date Inactive Date L97.322 Non-pressure chronic ulcer of left ankle with fat layer exposed 12/06/2021 12/06/2021 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 [name] Primary Care Provider: [name] Other Clinician: Referring Provider: Treating Provider/Extender: [name] Weeks in Treatment: 69 Subjective Chief Complaint Information obtained from Patient Left ankle ulcer, Left dorsal foot wound, right heel History of Present Illness (HPI) 10/08/18 on evaluation today patient actually presents to our office for initial evaluation concerning wounds that he has of the bilateral lower extremities. 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 He also has bilateral venous stasis. He apparently has been homeless currently following discharge from the hospital apparently he has been placed at [facility] which is is a skilled nursing facility locally. Nonetheless fortunately he does not show any signs of infection at this time which is good news. In fact several of the wound actually appears to be showing some signs of improvement already in my pinion. There are a couple areas in the left leg in particular there likely gonna require some sharp debridement to help clear away some necrotic tissue and help with more sufficient healing. No fevers, chills, nausea, or vomiting noted at this time. 10/15/18 on evaluation today patient actually appears to be doing very well in regard to his bilateral lower extremities. He's been tolerating the dressing changes without complication. Fortunately there does not appear to be any evidence of active infection at this time which is great news. Overall I'm actually very pleased with how this has progressed in just one visits time. 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 No doubt this has a lot to do with the discomfort in the right leg he describes when he is in bed. They have been using Xeroform on the wounds on the right. He has a lot of swelling in this leg with weeping edema 04-09-2023 upon evaluation today patient appears to be doing poorly still in regard to his right leg. 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  Fortunately I do not see any signs of active infection locally or systemically at this time which is great news. No fevers, chills, nausea, vomiting, or diarrhea. With that being said I am concerned still about the fact that he seems to be getting worse especially in regard to the heels I think that he is still sitting up most of the day which I think is still causing some swelling of his leg unfortunately. Electronic Signature(s) Signed: 04/09/2023 3:39:26 PM By: [name] PA-C Entered By: [name] on 04/09/2023 15:39:26 Chad [name] ([id number]) [id number] Page 6 of 16 -------------------------------------------------------------------------------- Physical Exam Details Patient Name: Date of Service: Chad [name], Chad [name] 04/09/2023 10:15 A M Medical Record Number: [id number] Patient Account Number: [id number] Date of Birth/Sex: Treating RN: Oct 24, 1960 (62 y.o. [name] Primary Care Provider: [name] Other Clinician: Referring Provider: Treating Provider/Extender: [name] Weeks in Treatment: 62 Constitutional Well-nourished and well-hydrated in no acute distress. Respiratory normal breathing without difficulty. Psychiatric this patient is able to make decisions and demonstrates good insight into disease process. Alert and Oriented x 3. pleasant and cooperative. Notes Upon inspection patient's wound bed actually showed signs of good granulation and epithelization at this point. Fortunately I do not see any signs of active infection at this time but at the same time he does continue to have quite a bit of swelling and the wounds are not better and in some cases even a little bit worse. Electronic Signature(s) Signed: 04/09/2023 3:39:49 PM By: [name] PA-C Entered By: [name] on 04/09/2023 15:39:49 -------------------------------------------------------------------------------- Physician Orders Details Patient Name:  Date of Service: Chad [name], Chad [name] 04/09/2023 10:15 A M Medical Record Number: [id number] Patient Account Number: [id number] Date of Birth/Sex: Treating RN: [birth year] (62 y.o. 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 Nurse Visit as needed Bathing/ Shower/ Hygiene Clean wound with Normal Saline or wound cleanser. No tub bath. Anesthetic (Use 'Patient Medications' Section for Anesthetic Order Entry) Lidocaine applied to wound bed Edema Control - Lymphedema / Segmental Compressive Device / Other Elevate legs to the level of the heart and pump ankles as often as possible Elevate leg(s) parallel to the floor when sitting. Other: - PATIENT MUST SLEEP IN A BED. Legs are swelling due to no elevation of legs. [facility] on 03/03/23 Off-Loading Heel suspension boot - when in bed Wound Treatment Wound #16 -  in the clinic. If anything worsens or changes patient will contact our office for additional recommendations. Electronic Signature(s) Signed: 04/09/2023 3:40:45 PM By: [name] PA-C Entered By: [name] on 04/09/2023 15:40:44 -------------------------------------------------------------------------------- SuperBill Details Patient Name: Date of Service: Chad [name], Chad [name] 04/09/2023 Medical Record Number: [id number] Patient Account Number: [id number] Date of Birth/Sex: Treating RN: 16-Mar-1961 (62 y.o. 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, stage 3 Facility Procedures : CPT4 Code: 91478295 Description: 62130 - WOUND CARE VISIT-LEV 5 EST PT Modifier: Quantity: 1 Physician Procedures :  CPT4 Code Description Modifier [id number] - WC PHYS LEVEL 3 - EST PT ICD-10 Diagnosis Description Chad [name] ([id number]) [id number] Page 1 [phone] Chronic venous hypertension (idiopathic) with ulcer and  inflammation of left lower extremity 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 of right foot with other specified severity I70.245 Atherosclerosis of native arteries  of left leg with ulceration of other part of foot Quantity: 1 6 of 16 Electronic Signature(s) Signed: 04/09/2023 3:41:17 PM By: [name] PA-C Previous Signature: 04/09/2023 2:58:27 PM Version By: Midge Aver MSN RN CNS WTA Entered By: [name] on 04/09/2023 15:41:16  Chad [name], Chad [name] ([id number]) [id number] Page 1 of 16 Visit Report for 04/09/2023 Chief Complaint Document Details Patient Name: Date of Service: Chad [name], Chad [name] 04/09/2023 10:15 A M Medical Record Number: [id number] Patient Account Number: [id number] Date of Birth/Sex: Treating RN: [birth year] (62 y.o. [name] Primary Care Provider: [name] Other Clinician: Referring Provider: Treating Provider/Extender: [name] Weeks in Treatment: 41 Information Obtained from: Patient Chief Complaint Left ankle ulcer, Left dorsal foot wound, right heel Electronic Signature(s) Signed: 04/09/2023 11:20:46 AM By: [name] PA-C Entered By: [name] on 04/09/2023 11:20:45 -------------------------------------------------------------------------------- HPI Details Patient Name: Date of Service: Chad [name] 04/09/2023 10:15 A M Medical Record Number: [id number] Patient Account Number: [id number] Date of Birth/Sex: Treating RN: May 15, 1961 (62 y.o. [name] Primary Care Provider: [name] Other Clinician: Referring Provider: Treating Provider/Extender: [name] Weeks in Treatment: 40 History of Present Illness HPI Description: 10/08/18 on evaluation today patient actually presents to our office for initial evaluation concerning wounds that he has of the bilateral lower extremities. 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 He also has bilateral venous stasis. He apparently has been homeless currently following discharge from the hospital apparently he has been placed at [facility] which is is a skilled nursing facility locally. Nonetheless fortunately he does not show any signs of infection at this time which is good news. In fact several of the wound  actually appears to be showing some signs of improvement already in my pinion. There are a couple areas in the left leg in particular there likely gonna require some sharp debridement to help clear away some necrotic tissue and help with more sufficient healing. No fevers, chills, nausea, or vomiting noted at this time. 10/15/18 on evaluation today patient actually appears to be doing very well in regard to his bilateral lower extremities. He's been tolerating the dressing changes without complication. Fortunately there does not appear to be any evidence of active infection at this time which is great news. Overall I'm actually very pleased with how this has progressed in just one visits time. Readmission: 08/14/2020 upon evaluation today patient presents for re-evaluation here in our clinic. He is having issues with his left ankle region as well as his right toe and his right heel. He tells me that the toe and heel actually began as a area that was itching that he was scratching and then subsequently opened up into wounds. These may have been abscess areas I presume based on what I am seeing currently. With regard to his left ankle region he tells me this was a similar type Chad [name], Chad [name] ([id number]) [id number] Page 2 of 16 occurrence although he does have venous stasis this very well may be more of a venous leg ulcer more than anything. Nonetheless I do believe that the patient would benefit from appropriate and aggressive wound care to try to help get things under better control here. He does have history of a stroke on the left side affecting him to some degree there that he is able to stand although he does have some residual weakness. Otherwise again the patient does have chronic venous insufficiency as previously noted. 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 No doubt this has a lot to do with the discomfort in the right leg he describes when he is in bed. They have been using Xeroform on the wounds on the right. He has a lot of swelling in this leg with weeping edema 04-09-2023 upon evaluation today patient appears to be doing poorly still in regard to his right leg. 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  Fortunately I do not see any signs of active infection locally or systemically at this time which is great news. No fevers, chills, nausea, vomiting, or diarrhea. With that being said I am concerned still about the fact that he seems to be getting worse especially in regard to the heels I think that he is still sitting up most of the day which I think is still causing some swelling of his leg unfortunately. Electronic Signature(s) Signed: 04/09/2023 3:39:26 PM By: [name] PA-C Entered By: [name] on 04/09/2023 15:39:26 Chad [name] ([id number]) [id number] Page 6 of 16 -------------------------------------------------------------------------------- Physical Exam Details Patient Name: Date of Service: Chad [name], Chad [name] 04/09/2023 10:15 A M Medical Record Number: [id number] Patient Account Number: [id number] Date of Birth/Sex: Treating RN: Oct 24, 1960 (62 y.o. [name] Primary Care Provider: [name] Other Clinician: Referring Provider: Treating Provider/Extender: [name] Weeks in Treatment: 62 Constitutional Well-nourished and well-hydrated in no acute distress. Respiratory normal breathing without difficulty. Psychiatric this patient is able to make decisions and demonstrates good insight into disease process. Alert and Oriented x 3. pleasant and cooperative. Notes Upon inspection patient's wound bed actually showed signs of good granulation and epithelization at this point. Fortunately I do not see any signs of active infection at this time but at the same time he does continue to have quite a bit of swelling and the wounds are not better and in some cases even a little bit worse. Electronic Signature(s) Signed: 04/09/2023 3:39:49 PM By: [name] PA-C Entered By: [name] on 04/09/2023 15:39:49 -------------------------------------------------------------------------------- Physician Orders Details Patient Name:  Date of Service: Chad [name], Chad [name] 04/09/2023 10:15 A M Medical Record Number: [id number] Patient Account Number: [id number] Date of Birth/Sex: Treating RN: [birth year] (62 y.o. 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 Nurse Visit as needed Bathing/ Shower/ Hygiene Clean wound with Normal Saline or wound cleanser. No tub bath. Anesthetic (Use 'Patient Medications' Section for Anesthetic Order Entry) Lidocaine applied to wound bed Edema Control - Lymphedema / Segmental Compressive Device / Other Elevate legs to the level of the heart and pump ankles as often as possible Elevate leg(s) parallel to the floor when sitting. Other: - PATIENT MUST SLEEP IN A BED. Legs are swelling due to no elevation of legs. 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 No doubt this has a lot to do with the discomfort in the right leg he describes when he is in bed. They have been using Xeroform on the wounds on the right. He has a lot of swelling in this leg with weeping edema 04-09-2023 upon evaluation today patient appears to be doing poorly still in regard to his right leg. 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  Fortunately I do not see any signs of active infection locally or systemically at this time which is great news. No fevers, chills, nausea, vomiting, or diarrhea. With that being said I am concerned still about the fact that he seems to be getting worse especially in regard to the heels I think that he is still sitting up most of the day which I think is still causing some swelling of his leg unfortunately. Electronic Signature(s) Signed: 04/09/2023 3:39:26 PM By: [name] PA-C Entered By: [name] on 04/09/2023 15:39:26 Chad [name] ([id number]) [id number] Page 6 of 16 -------------------------------------------------------------------------------- Physical Exam Details Patient Name: Date of Service: Chad [name], Chad [name] 04/09/2023 10:15 A M Medical Record Number: [id number] Patient Account Number: [id number] Date of Birth/Sex: Treating RN: Oct 24, 1960 (62 y.o. [name] Primary Care Provider: [name] Other Clinician: Referring Provider: Treating Provider/Extender: [name] Weeks in Treatment: 62 Constitutional Well-nourished and well-hydrated in no acute distress. Respiratory normal breathing without difficulty. Psychiatric this patient is able to make decisions and demonstrates good insight into disease process. Alert and Oriented x 3. pleasant and cooperative. Notes Upon inspection patient's wound bed actually showed signs of good granulation and epithelization at this point. Fortunately I do not see any signs of active infection at this time but at the same time he does continue to have quite a bit of swelling and the wounds are not better and in some cases even a little bit worse. Electronic Signature(s) Signed: 04/09/2023 3:39:49 PM By: [name] PA-C Entered By: [name] on 04/09/2023 15:39:49 -------------------------------------------------------------------------------- Physician Orders Details Patient Name:  Date of Service: Chad [name], Chad [name] 04/09/2023 10:15 A M Medical Record Number: [id number] Patient Account Number: [id number] Date of Birth/Sex: Treating RN: [birth year] (62 y.o. 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 Nurse Visit as needed Bathing/ Shower/ Hygiene Clean wound with Normal Saline or wound cleanser. No tub bath. Anesthetic (Use 'Patient Medications' Section for Anesthetic Order Entry) Lidocaine applied to wound bed Edema Control - Lymphedema / Segmental Compressive Device / Other Elevate legs to the level of the heart and pump ankles as often as possible Elevate leg(s) parallel to the floor when sitting. Other: - PATIENT MUST SLEEP IN A BED. Legs are swelling due to no elevation of legs. [facility] on 03/03/23 Off-Loading Heel suspension boot - when in bed Wound Treatment Wound #16 -  in the clinic. If anything worsens or changes patient will contact our office for additional recommendations. Electronic Signature(s) Signed: 04/09/2023 3:40:45 PM By: [name] PA-C Entered By: [name] on 04/09/2023 15:40:44 -------------------------------------------------------------------------------- SuperBill Details Patient Name: Date of Service: Chad [name], Chad [name] 04/09/2023 Medical Record Number: [id number] Patient Account Number: [id number] Date of Birth/Sex: Treating RN: 16-Mar-1961 (62 y.o. 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, stage 3 Facility Procedures : CPT4 Code: 91478295 Description: 62130 - WOUND CARE VISIT-LEV 5 EST PT Modifier: Quantity: 1 Physician Procedures :  CPT4 Code Description Modifier [id number] - WC PHYS LEVEL 3 - EST PT ICD-10 Diagnosis Description Chad [name] ([id number]) [id number] Page 1 [phone] Chronic venous hypertension (idiopathic) with ulcer and  inflammation of left lower extremity 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 of right foot with other specified severity I70.245 Atherosclerosis of native arteries  of left leg with ulceration of other part of foot Quantity: 1 6 of 16 Electronic Signature(s) Signed: 04/09/2023 3:41:17 PM By: [name] PA-C Previous Signature: 04/09/2023 2:58:27 PM Version By: Midge Aver MSN RN CNS WTA Entered By: [name] on 04/09/2023 15:41:16  Chad [name], Chad [name] ([id number]) [id number] Page 1 of 16 Visit Report for 04/09/2023 Chief Complaint Document Details Patient Name: Date of Service: Chad [name], Chad [name] 04/09/2023 10:15 A M Medical Record Number: [id number] Patient Account Number: [id number] Date of Birth/Sex: Treating RN: [birth year] (62 y.o. [name] Primary Care Provider: [name] Other Clinician: Referring Provider: Treating Provider/Extender: [name] Weeks in Treatment: 41 Information Obtained from: Patient Chief Complaint Left ankle ulcer, Left dorsal foot wound, right heel Electronic Signature(s) Signed: 04/09/2023 11:20:46 AM By: [name] PA-C Entered By: [name] on 04/09/2023 11:20:45 -------------------------------------------------------------------------------- HPI Details Patient Name: Date of Service: Chad [name] 04/09/2023 10:15 A M Medical Record Number: [id number] Patient Account Number: [id number] Date of Birth/Sex: Treating RN: May 15, 1961 (62 y.o. [name] Primary Care Provider: [name] Other Clinician: Referring Provider: Treating Provider/Extender: [name] Weeks in Treatment: 40 History of Present Illness HPI Description: 10/08/18 on evaluation today patient actually presents to our office for initial evaluation concerning wounds that he has of the bilateral lower extremities. 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 He also has bilateral venous stasis. He apparently has been homeless currently following discharge from the hospital apparently he has been placed at [facility] which is is a skilled nursing facility locally. Nonetheless fortunately he does not show any signs of infection at this time which is good news. In fact several of the wound  actually appears to be showing some signs of improvement already in my pinion. There are a couple areas in the left leg in particular there likely gonna require some sharp debridement to help clear away some necrotic tissue and help with more sufficient healing. No fevers, chills, nausea, or vomiting noted at this time. 10/15/18 on evaluation today patient actually appears to be doing very well in regard to his bilateral lower extremities. He's been tolerating the dressing changes without complication. Fortunately there does not appear to be any evidence of active infection at this time which is great news. Overall I'm actually very pleased with how this has progressed in just one visits time. Readmission: 08/14/2020 upon evaluation today patient presents for re-evaluation here in our clinic. He is having issues with his left ankle region as well as his right toe and his right heel. He tells me that the toe and heel actually began as a area that was itching that he was scratching and then subsequently opened up into wounds. These may have been abscess areas I presume based on what I am seeing currently. With regard to his left ankle region he tells me this was a similar type Chad [name], Chad [name] ([id number]) [id number] Page 2 of 16 occurrence although he does have venous stasis this very well may be more of a venous leg ulcer more than anything. Nonetheless I do believe that the patient would benefit from appropriate and aggressive wound care to try to help get things under better control here. He does have history of a stroke on the left side affecting him to some degree there that he is able to stand although he does have some residual weakness. Otherwise again the patient does have chronic venous insufficiency as previously noted. 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 No doubt this has a lot to do with the discomfort in the right leg he describes when he is in bed. They have been using Xeroform on the wounds on the right. He has a lot of swelling in this leg with weeping edema 04-09-2023 upon evaluation today patient appears to be doing poorly still in regard to his right leg. 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  Fortunately I do not see any signs of active infection locally or systemically at this time which is great news. No fevers, chills, nausea, vomiting, or diarrhea. With that being said I am concerned still about the fact that he seems to be getting worse especially in regard to the heels I think that he is still sitting up most of the day which I think is still causing some swelling of his leg unfortunately. Electronic Signature(s) Signed: 04/09/2023 3:39:26 PM By: [name] PA-C Entered By: [name] on 04/09/2023 15:39:26 Chad [name] ([id number]) [id number] Page 6 of 16 -------------------------------------------------------------------------------- Physical Exam Details Patient Name: Date of Service: Chad [name], Chad [name] 04/09/2023 10:15 A M Medical Record Number: [id number] Patient Account Number: [id number] Date of Birth/Sex: Treating RN: Oct 24, 1960 (62 y.o. [name] Primary Care Provider: [name] Other Clinician: Referring Provider: Treating Provider/Extender: [name] Weeks in Treatment: 62 Constitutional Well-nourished and well-hydrated in no acute distress. Respiratory normal breathing without difficulty. Psychiatric this patient is able to make decisions and demonstrates good insight into disease process. Alert and Oriented x 3. pleasant and cooperative. Notes Upon inspection patient's wound bed actually showed signs of good granulation and epithelization at this point. Fortunately I do not see any signs of active infection at this time but at the same time he does continue to have quite a bit of swelling and the wounds are not better and in some cases even a little bit worse. Electronic Signature(s) Signed: 04/09/2023 3:39:49 PM By: [name] PA-C Entered By: [name] on 04/09/2023 15:39:49 -------------------------------------------------------------------------------- Physician Orders Details Patient Name:  Date of Service: Chad [name], Chad [name] 04/09/2023 10:15 A M Medical Record Number: [id number] Patient Account Number: [id number] Date of Birth/Sex: Treating RN: [birth year] (62 y.o. 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 Nurse Visit as needed Bathing/ Shower/ Hygiene Clean wound with Normal Saline or wound cleanser. No tub bath. Anesthetic (Use 'Patient Medications' Section for Anesthetic Order Entry) Lidocaine applied to wound bed Edema Control - Lymphedema / Segmental Compressive Device / Other Elevate legs to the level of the heart and pump ankles as often as possible Elevate leg(s) parallel to the floor when sitting. Other: - PATIENT MUST SLEEP IN A BED. Legs are swelling due to no elevation of legs. 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 No doubt this has a lot to do with the discomfort in the right leg he describes when he is in bed. They have been using Xeroform on the wounds on the right. He has a lot of swelling in this leg with weeping edema 04-09-2023 upon evaluation today patient appears to be doing poorly still in regard to his right leg. 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  Fortunately I do not see any signs of active infection locally or systemically at this time which is great news. No fevers, chills, nausea, vomiting, or diarrhea. With that being said I am concerned still about the fact that he seems to be getting worse especially in regard to the heels I think that he is still sitting up most of the day which I think is still causing some swelling of his leg unfortunately. Electronic Signature(s) Signed: 04/09/2023 3:39:26 PM By: [name] PA-C Entered By: [name] on 04/09/2023 15:39:26 Chad [name] ([id number]) [id number] Page 6 of 16 -------------------------------------------------------------------------------- Physical Exam Details Patient Name: Date of Service: Chad [name], Chad [name] 04/09/2023 10:15 A M Medical Record Number: [id number] Patient Account Number: [id number] Date of Birth/Sex: Treating RN: Oct 24, 1960 (62 y.o. [name] Primary Care Provider: [name] Other Clinician: Referring Provider: Treating Provider/Extender: [name] Weeks in Treatment: 62 Constitutional Well-nourished and well-hydrated in no acute distress. Respiratory normal breathing without difficulty. Psychiatric this patient is able to make decisions and demonstrates good insight into disease process. Alert and Oriented x 3. pleasant and cooperative. Notes Upon inspection patient's wound bed actually showed signs of good granulation and epithelization at this point. Fortunately I do not see any signs of active infection at this time but at the same time he does continue to have quite a bit of swelling and the wounds are not better and in some cases even a little bit worse. Electronic Signature(s) Signed: 04/09/2023 3:39:49 PM By: [name] PA-C Entered By: [name] on 04/09/2023 15:39:49 -------------------------------------------------------------------------------- Physician Orders Details Patient Name:  Date of Service: Chad [name], Chad [name] 04/09/2023 10:15 A M Medical Record Number: [id number] Patient Account Number: [id number] Date of Birth/Sex: Treating RN: [birth year] (62 y.o. 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 Nurse Visit as needed Bathing/ Shower/ Hygiene Clean wound with Normal Saline or wound cleanser. No tub bath. Anesthetic (Use 'Patient Medications' Section for Anesthetic Order Entry) Lidocaine applied to wound bed Edema Control - Lymphedema / Segmental Compressive Device / Other Elevate legs to the level of the heart and pump ankles as often as possible Elevate leg(s) parallel to the floor when sitting. Other: - PATIENT MUST SLEEP IN A BED. Legs are swelling due to no elevation of legs. 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 No doubt this has a lot to do with the discomfort in the right leg he describes when he is in bed. They have been using Xeroform on the wounds on the right. He has a lot of swelling in this leg with weeping edema 04-09-2023 upon evaluation today patient appears to be doing poorly still in regard to his right leg. 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  Fortunately I do not see any signs of active infection locally or systemically at this time which is great news. No fevers, chills, nausea, vomiting, or diarrhea. With that being said I am concerned still about the fact that he seems to be getting worse especially in regard to the heels I think that he is still sitting up most of the day which I think is still causing some swelling of his leg unfortunately. Electronic Signature(s) Signed: 04/09/2023 3:39:26 PM By: [name] PA-C Entered By: [name] on 04/09/2023 15:39:26 Chad [name] ([id number]) [id number] Page 6 of 16 -------------------------------------------------------------------------------- Physical Exam Details Patient Name: Date of Service: Chad [name], Chad [name] 04/09/2023 10:15 A M Medical Record Number: [id number] Patient Account Number: [id number] Date of Birth/Sex: Treating RN: Oct 24, 1960 (62 y.o. [name] Primary Care Provider: [name] Other Clinician: Referring Provider: Treating Provider/Extender: [name] Weeks in Treatment: 62 Constitutional Well-nourished and well-hydrated in no acute distress. Respiratory normal breathing without difficulty. Psychiatric this patient is able to make decisions and demonstrates good insight into disease process. Alert and Oriented x 3. pleasant and cooperative. Notes Upon inspection patient's wound bed actually showed signs of good granulation and epithelization at this point. Fortunately I do not see any signs of active infection at this time but at the same time he does continue to have quite a bit of swelling and the wounds are not better and in some cases even a little bit worse. Electronic Signature(s) Signed: 04/09/2023 3:39:49 PM By: [name] PA-C Entered By: [name] on 04/09/2023 15:39:49 -------------------------------------------------------------------------------- Physician Orders Details Patient Name:  Date of Service: Chad [name], Chad [name] 04/09/2023 10:15 A M Medical Record Number: [id number] Patient Account Number: [id number] Date of Birth/Sex: Treating RN: [birth year] (62 y.o. 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 Nurse Visit as needed Bathing/ Shower/ Hygiene Clean wound with Normal Saline or wound cleanser. No tub bath. Anesthetic (Use 'Patient Medications' Section for Anesthetic Order Entry) Lidocaine applied to wound bed Edema Control - Lymphedema / Segmental Compressive Device / Other Elevate legs to the level of the heart and pump ankles as often as possible Elevate leg(s) parallel to the floor when sitting. Other: - PATIENT MUST SLEEP IN A BED. Legs are swelling due to no elevation of legs. [facility] on 03/03/23 Off-Loading Heel suspension boot - when in bed Wound Treatment Wound #16 -  Chad [name], Chad [name] ([id number]) [id number] Page 1 of 16 Visit Report for 04/09/2023 Chief Complaint Document Details Patient Name: Date of Service: Chad [name], Chad [name] 04/09/2023 10:15 A M Medical Record Number: [id number] Patient Account Number: [id number] Date of Birth/Sex: Treating RN: [birth year] (62 y.o. [name] Primary Care Provider: [name] Other Clinician: Referring Provider: Treating Provider/Extender: [name] Weeks in Treatment: 41 Information Obtained from: Patient Chief Complaint Left ankle ulcer, Left dorsal foot wound, right heel Electronic Signature(s) Signed: 04/09/2023 11:20:46 AM By: [name] PA-C Entered By: [name] on 04/09/2023 11:20:45 -------------------------------------------------------------------------------- HPI Details Patient Name: Date of Service: Chad [name] 04/09/2023 10:15 A M Medical Record Number: [id number] Patient Account Number: [id number] Date of Birth/Sex: Treating RN: May 15, 1961 (62 y.o. [name] Primary Care Provider: [name] Other Clinician: Referring Provider: Treating Provider/Extender: [name] Weeks in Treatment: 40 History of Present Illness HPI Description: 10/08/18 on evaluation today patient actually presents to our office for initial evaluation concerning wounds that he has of the bilateral lower extremities. 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 He also has bilateral venous stasis. He apparently has been homeless currently following discharge from the hospital apparently he has been placed at [facility] which is is a skilled nursing facility locally. Nonetheless fortunately he does not show any signs of infection at this time which is good news. In fact several of the wound  actually appears to be showing some signs of improvement already in my pinion. There are a couple areas in the left leg in particular there likely gonna require some sharp debridement to help clear away some necrotic tissue and help with more sufficient healing. No fevers, chills, nausea, or vomiting noted at this time. 10/15/18 on evaluation today patient actually appears to be doing very well in regard to his bilateral lower extremities. He's been tolerating the dressing changes without complication. Fortunately there does not appear to be any evidence of active infection at this time which is great news. Overall I'm actually very pleased with how this has progressed in just one visits time. Readmission: 08/14/2020 upon evaluation today patient presents for re-evaluation here in our clinic. He is having issues with his left ankle region as well as his right toe and his right heel. He tells me that the toe and heel actually began as a area that was itching that he was scratching and then subsequently opened up into wounds. These may have been abscess areas I presume based on what I am seeing currently. With regard to his left ankle region he tells me this was a similar type Chad [name], Chad [name] ([id number]) [id number] Page 2 of 16 occurrence although he does have venous stasis this very well may be more of a venous leg ulcer more than anything. Nonetheless I do believe that the patient would benefit from appropriate and aggressive wound care to try to help get things under better control here. He does have history of a stroke on the left side affecting him to some degree there that he is able to stand although he does have some residual weakness. Otherwise again the patient does have chronic venous insufficiency as previously noted. 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 No doubt this has a lot to do with the discomfort in the right leg he describes when he is in bed. They have been using Xeroform on the wounds on the right. He has a lot of swelling in this leg with weeping edema 04-09-2023 upon evaluation today patient appears to be doing poorly still in regard to his right leg. 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  Fortunately I do not see any signs of active infection locally or systemically at this time which is great news. No fevers, chills, nausea, vomiting, or diarrhea. With that being said I am concerned still about the fact that he seems to be getting worse especially in regard to the heels I think that he is still sitting up most of the day which I think is still causing some swelling of his leg unfortunately. Electronic Signature(s) Signed: 04/09/2023 3:39:26 PM By: [name] PA-C Entered By: [name] on 04/09/2023 15:39:26 Chad [name] ([id number]) [id number] Page 6 of 16 -------------------------------------------------------------------------------- Physical Exam Details Patient Name: Date of Service: Chad [name], Chad [name] 04/09/2023 10:15 A M Medical Record Number: [id number] Patient Account Number: [id number] Date of Birth/Sex: Treating RN: Oct 24, 1960 (62 y.o. [name] Primary Care Provider: [name] Other Clinician: Referring Provider: Treating Provider/Extender: [name] Weeks in Treatment: 62 Constitutional Well-nourished and well-hydrated in no acute distress. Respiratory normal breathing without difficulty. Psychiatric this patient is able to make decisions and demonstrates good insight into disease process. Alert and Oriented x 3. pleasant and cooperative. Notes Upon inspection patient's wound bed actually showed signs of good granulation and epithelization at this point. Fortunately I do not see any signs of active infection at this time but at the same time he does continue to have quite a bit of swelling and the wounds are not better and in some cases even a little bit worse. Electronic Signature(s) Signed: 04/09/2023 3:39:49 PM By: [name] PA-C Entered By: [name] on 04/09/2023 15:39:49 -------------------------------------------------------------------------------- Physician Orders Details Patient Name:  Date of Service: Chad [name], Chad [name] 04/09/2023 10:15 A M Medical Record Number: [id number] Patient Account Number: [id number] Date of Birth/Sex: Treating RN: [birth year] (62 y.o. 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 Nurse Visit as needed Bathing/ Shower/ Hygiene Clean wound with Normal Saline or wound cleanser. No tub bath. Anesthetic (Use 'Patient Medications' Section for Anesthetic Order Entry) Lidocaine applied to wound bed Edema Control - Lymphedema / Segmental Compressive Device / Other Elevate legs to the level of the heart and pump ankles as often as possible Elevate leg(s) parallel to the floor when sitting. Other: - PATIENT MUST SLEEP IN A BED. Legs are swelling due to no elevation of legs. 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 No doubt this has a lot to do with the discomfort in the right leg he describes when he is in bed. They have been using Xeroform on the wounds on the right. He has a lot of swelling in this leg with weeping edema 04-09-2023 upon evaluation today patient appears to be doing poorly still in regard to his right leg. 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  Fortunately I do not see any signs of active infection locally or systemically at this time which is great news. No fevers, chills, nausea, vomiting, or diarrhea. With that being said I am concerned still about the fact that he seems to be getting worse especially in regard to the heels I think that he is still sitting up most of the day which I think is still causing some swelling of his leg unfortunately. Electronic Signature(s) Signed: 04/09/2023 3:39:26 PM By: [name] PA-C Entered By: [name] on 04/09/2023 15:39:26 Chad [name] ([id number]) [id number] Page 6 of 16 -------------------------------------------------------------------------------- Physical Exam Details Patient Name: Date of Service: Chad [name], Chad [name] 04/09/2023 10:15 A M Medical Record Number: [id number] Patient Account Number: [id number] Date of Birth/Sex: Treating RN: Oct 24, 1960 (62 y.o. [name] Primary Care Provider: [name] Other Clinician: Referring Provider: Treating Provider/Extender: [name] Weeks in Treatment: 62 Constitutional Well-nourished and well-hydrated in no acute distress. Respiratory normal breathing without difficulty. Psychiatric this patient is able to make decisions and demonstrates good insight into disease process. Alert and Oriented x 3. pleasant and cooperative. Notes Upon inspection patient's wound bed actually showed signs of good granulation and epithelization at this point. Fortunately I do not see any signs of active infection at this time but at the same time he does continue to have quite a bit of swelling and the wounds are not better and in some cases even a little bit worse. Electronic Signature(s) Signed: 04/09/2023 3:39:49 PM By: [name] PA-C Entered By: [name] on 04/09/2023 15:39:49 -------------------------------------------------------------------------------- Physician Orders Details Patient Name:  Date of Service: Chad [name], Chad [name] 04/09/2023 10:15 A M Medical Record Number: [id number] Patient Account Number: [id number] Date of Birth/Sex: Treating RN: [birth year] (62 y.o. 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 Nurse Visit as needed Bathing/ Shower/ Hygiene Clean wound with Normal Saline or wound cleanser. No tub bath. Anesthetic (Use 'Patient Medications' Section for Anesthetic Order Entry) Lidocaine applied to wound bed Edema Control - Lymphedema / Segmental Compressive Device / Other Elevate legs to the level of the heart and pump ankles as often as possible Elevate leg(s) parallel to the floor when sitting. Other: - PATIENT MUST SLEEP IN A BED. Legs are swelling due to no elevation of legs. 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 No doubt this has a lot to do with the discomfort in the right leg he describes when he is in bed. They have been using Xeroform on the wounds on the right. He has a lot of swelling in this leg with weeping edema 04-09-2023 upon evaluation today patient appears to be doing poorly still in regard to his right leg. 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  Fortunately I do not see any signs of active infection locally or systemically at this time which is great news. No fevers, chills, nausea, vomiting, or diarrhea. With that being said I am concerned still about the fact that he seems to be getting worse especially in regard to the heels I think that he is still sitting up most of the day which I think is still causing some swelling of his leg unfortunately. Electronic Signature(s) Signed: 04/09/2023 3:39:26 PM By: [name] PA-C Entered By: [name] on 04/09/2023 15:39:26 Chad [name] ([id number]) [id number] Page 6 of 16 -------------------------------------------------------------------------------- Physical Exam Details Patient Name: Date of Service: Chad [name], Chad [name] 04/09/2023 10:15 A M Medical Record Number: [id number] Patient Account Number: [id number] Date of Birth/Sex: Treating RN: Oct 24, 1960 (62 y.o. [name] Primary Care Provider: [name] Other Clinician: Referring Provider: Treating Provider/Extender: [name] Weeks in Treatment: 62 Constitutional Well-nourished and well-hydrated in no acute distress. Respiratory normal breathing without difficulty. Psychiatric this patient is able to make decisions and demonstrates good insight into disease process. Alert and Oriented x 3. pleasant and cooperative. Notes Upon inspection patient's wound bed actually showed signs of good granulation and epithelization at this point. Fortunately I do not see any signs of active infection at this time but at the same time he does continue to have quite a bit of swelling and the wounds are not better and in some cases even a little bit worse. Electronic Signature(s) Signed: 04/09/2023 3:39:49 PM By: [name] PA-C Entered By: [name] on 04/09/2023 15:39:49 -------------------------------------------------------------------------------- Physician Orders Details Patient Name:  Date of Service: Chad [name], Chad [name] 04/09/2023 10:15 A M Medical Record Number: [id number] Patient Account Number: [id number] Date of Birth/Sex: Treating RN: [birth year] (62 y.o. 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 Nurse Visit as needed Bathing/ Shower/ Hygiene Clean wound with Normal Saline or wound cleanser. No tub bath. Anesthetic (Use 'Patient Medications' Section for Anesthetic Order Entry) Lidocaine applied to wound bed Edema Control - Lymphedema / Segmental Compressive Device / Other Elevate legs to the level of the heart and pump ankles as often as possible Elevate leg(s) parallel to the floor when sitting. Other: - PATIENT MUST SLEEP IN A BED. Legs are swelling due to no elevation of legs. [facility] on 03/03/23 Off-Loading Heel suspension boot - when in bed Wound Treatment Wound #16 -  Chad [name], Chad [name] ([id number]) [id number] Page 1 of 16 Visit Report for 04/09/2023 Chief Complaint Document Details Patient Name: Date of Service: Chad [name], Chad [name] 04/09/2023 10:15 A M Medical Record Number: [id number] Patient Account Number: [id number] Date of Birth/Sex: Treating RN: [birth year] (62 y.o. [name] Primary Care Provider: [name] Other Clinician: Referring Provider: Treating Provider/Extender: [name] Weeks in Treatment: 41 Information Obtained from: Patient Chief Complaint Left ankle ulcer, Left dorsal foot wound, right heel Electronic Signature(s) Signed: 04/09/2023 11:20:46 AM By: [name] PA-C Entered By: [name] on 04/09/2023 11:20:45 -------------------------------------------------------------------------------- HPI Details Patient Name: Date of Service: Chad [name] 04/09/2023 10:15 A M Medical Record Number: [id number] Patient Account Number: [id number] Date of Birth/Sex: Treating RN: May 15, 1961 (62 y.o. [name] Primary Care Provider: [name] Other Clinician: Referring Provider: Treating Provider/Extender: [name] Weeks in Treatment: 40 History of Present Illness HPI Description: 10/08/18 on evaluation today patient actually presents to our office for initial evaluation concerning wounds that he has of the bilateral lower extremities. 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 He also has bilateral venous stasis. He apparently has been homeless currently following discharge from the hospital apparently he has been placed at [facility] which is is a skilled nursing facility locally. Nonetheless fortunately he does not show any signs of infection at this time which is good news. In fact several of the wound  actually appears to be showing some signs of improvement already in my pinion. There are a couple areas in the left leg in particular there likely gonna require some sharp debridement to help clear away some necrotic tissue and help with more sufficient healing. No fevers, chills, nausea, or vomiting noted at this time. 10/15/18 on evaluation today patient actually appears to be doing very well in regard to his bilateral lower extremities. He's been tolerating the dressing changes without complication. Fortunately there does not appear to be any evidence of active infection at this time which is great news. Overall I'm actually very pleased with how this has progressed in just one visits time. Readmission: 08/14/2020 upon evaluation today patient presents for re-evaluation here in our clinic. He is having issues with his left ankle region as well as his right toe and his right heel. He tells me that the toe and heel actually began as a area that was itching that he was scratching and then subsequently opened up into wounds. These may have been abscess areas I presume based on what I am seeing currently. With regard to his left ankle region he tells me this was a similar type Chad [name], Chad [name] ([id number]) [id number] Page 2 of 16 occurrence although he does have venous stasis this very well may be more of a venous leg ulcer more than anything. Nonetheless I do believe that the patient would benefit from appropriate and aggressive wound care to try to help get things under better control here. He does have history of a stroke on the left side affecting him to some degree there that he is able to stand although he does have some residual weakness. Otherwise again the patient does have chronic venous insufficiency as previously noted. 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 No doubt this has a lot to do with the discomfort in the right leg he describes when he is in bed. They have been using Xeroform on the wounds on the right. He has a lot of swelling in this leg with weeping edema 04-09-2023 upon evaluation today patient appears to be doing poorly still in regard to his right leg. 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  Fortunately I do not see any signs of active infection locally or systemically at this time which is great news. No fevers, chills, nausea, vomiting, or diarrhea. With that being said I am concerned still about the fact that he seems to be getting worse especially in regard to the heels I think that he is still sitting up most of the day which I think is still causing some swelling of his leg unfortunately. Electronic Signature(s) Signed: 04/09/2023 3:39:26 PM By: [name] PA-C Entered By: [name] on 04/09/2023 15:39:26 Chad [name] ([id number]) [id number] Page 6 of 16 -------------------------------------------------------------------------------- Physical Exam Details Patient Name: Date of Service: Chad [name], Chad [name] 04/09/2023 10:15 A M Medical Record Number: [id number] Patient Account Number: [id number] Date of Birth/Sex: Treating RN: Oct 24, 1960 (62 y.o. [name] Primary Care Provider: [name] Other Clinician: Referring Provider: Treating Provider/Extender: [name] Weeks in Treatment: 62 Constitutional Well-nourished and well-hydrated in no acute distress. Respiratory normal breathing without difficulty. Psychiatric this patient is able to make decisions and demonstrates good insight into disease process. Alert and Oriented x 3. pleasant and cooperative. Notes Upon inspection patient's wound bed actually showed signs of good granulation and epithelization at this point. Fortunately I do not see any signs of active infection at this time but at the same time he does continue to have quite a bit of swelling and the wounds are not better and in some cases even a little bit worse. Electronic Signature(s) Signed: 04/09/2023 3:39:49 PM By: [name] PA-C Entered By: [name] on 04/09/2023 15:39:49 -------------------------------------------------------------------------------- Physician Orders Details Patient Name:  Date of Service: Chad [name], Chad [name] 04/09/2023 10:15 A M Medical Record Number: [id number] Patient Account Number: [id number] Date of Birth/Sex: Treating RN: [birth year] (62 y.o. 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 Nurse Visit as needed Bathing/ Shower/ Hygiene Clean wound with Normal Saline or wound cleanser. No tub bath. Anesthetic (Use 'Patient Medications' Section for Anesthetic Order Entry) Lidocaine applied to wound bed Edema Control - Lymphedema / Segmental Compressive Device / Other Elevate legs to the level of the heart and pump ankles as often as possible Elevate leg(s) parallel to the floor when sitting. Other: - PATIENT MUST SLEEP IN A BED. Legs are swelling due to no elevation of legs. [facility] on 03/03/23 Off-Loading Heel suspension boot - when in bed Wound Treatment Wound #16 -  Chad [name], Chad [name] ([id number]) [id number] Page 1 of 16 Visit Report for 04/09/2023 Chief Complaint Document Details Patient Name: Date of Service: Chad [name], Chad [name] 04/09/2023 10:15 A M Medical Record Number: [id number] Patient Account Number: [id number] Date of Birth/Sex: Treating RN: [birth year] (62 y.o. [name] Primary Care Provider: [name] Other Clinician: Referring Provider: Treating Provider/Extender: [name] Weeks in Treatment: 41 Information Obtained from: Patient Chief Complaint Left ankle ulcer, Left dorsal foot wound, right heel Electronic Signature(s) Signed: 04/09/2023 11:20:46 AM By: [name] PA-C Entered By: [name] on 04/09/2023 11:20:45 -------------------------------------------------------------------------------- HPI Details Patient Name: Date of Service: Chad [name] 04/09/2023 10:15 A M Medical Record Number: [id number] Patient Account Number: [id number] Date of Birth/Sex: Treating RN: May 15, 1961 (62 y.o. [name] Primary Care Provider: [name] Other Clinician: Referring Provider: Treating Provider/Extender: [name] Weeks in Treatment: 40 History of Present Illness HPI Description: 10/08/18 on evaluation today patient actually presents to our office for initial evaluation concerning wounds that he has of the bilateral lower extremities. 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 He also has bilateral venous stasis. He apparently has been homeless currently following discharge from the hospital apparently he has been placed at [facility] which is is a skilled nursing facility locally. Nonetheless fortunately he does not show any signs of infection at this time which is good news. In fact several of the wound  actually appears to be showing some signs of improvement already in my pinion. There are a couple areas in the left leg in particular there likely gonna require some sharp debridement to help clear away some necrotic tissue and help with more sufficient healing. No fevers, chills, nausea, or vomiting noted at this time. 10/15/18 on evaluation today patient actually appears to be doing very well in regard to his bilateral lower extremities. He's been tolerating the dressing changes without complication. Fortunately there does not appear to be any evidence of active infection at this time which is great news. Overall I'm actually very pleased with how this has progressed in just one visits time. Readmission: 08/14/2020 upon evaluation today patient presents for re-evaluation here in our clinic. He is having issues with his left ankle region as well as his right toe and his right heel. He tells me that the toe and heel actually began as a area that was itching that he was scratching and then subsequently opened up into wounds. These may have been abscess areas I presume based on what I am seeing currently. With regard to his left ankle region he tells me this was a similar type Chad [name], Chad [name] ([id number]) [id number] Page 2 of 16 occurrence although he does have venous stasis this very well may be more of a venous leg ulcer more than anything. Nonetheless I do believe that the patient would benefit from appropriate and aggressive wound care to try to help get things under better control here. He does have history of a stroke on the left side affecting him to some degree there that he is able to stand although he does have some residual weakness. Otherwise again the patient does have chronic venous insufficiency as previously noted. 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 No doubt this has a lot to do with the discomfort in the right leg he describes when he is in bed. They have been using Xeroform on the wounds on the right. He has a lot of swelling in this leg with weeping edema 04-09-2023 upon evaluation today patient appears to be doing poorly still in regard to his right leg. 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  Fortunately I do not see any signs of active infection locally or systemically at this time which is great news. No fevers, chills, nausea, vomiting, or diarrhea. With that being said I am concerned still about the fact that he seems to be getting worse especially in regard to the heels I think that he is still sitting up most of the day which I think is still causing some swelling of his leg unfortunately. Electronic Signature(s) Signed: 04/09/2023 3:39:26 PM By: [name] PA-C Entered By: [name] on 04/09/2023 15:39:26 Chad [name] ([id number]) [id number] Page 6 of 16 -------------------------------------------------------------------------------- Physical Exam Details Patient Name: Date of Service: Chad [name], Chad [name] 04/09/2023 10:15 A M Medical Record Number: [id number] Patient Account Number: [id number] Date of Birth/Sex: Treating RN: Oct 24, 1960 (62 y.o. [name] Primary Care Provider: [name] Other Clinician: Referring Provider: Treating Provider/Extender: [name] Weeks in Treatment: 62 Constitutional Well-nourished and well-hydrated in no acute distress. Respiratory normal breathing without difficulty. Psychiatric this patient is able to make decisions and demonstrates good insight into disease process. Alert and Oriented x 3. pleasant and cooperative. Notes Upon inspection patient's wound bed actually showed signs of good granulation and epithelization at this point. Fortunately I do not see any signs of active infection at this time but at the same time he does continue to have quite a bit of swelling and the wounds are not better and in some cases even a little bit worse. Electronic Signature(s) Signed: 04/09/2023 3:39:49 PM By: [name] PA-C Entered By: [name] on 04/09/2023 15:39:49 -------------------------------------------------------------------------------- Physician Orders Details Patient Name:  Date of Service: Chad [name], Chad [name] 04/09/2023 10:15 A M Medical Record Number: [id number] Patient Account Number: [id number] Date of Birth/Sex: Treating RN: [birth year] (62 y.o. 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 Nurse Visit as needed Bathing/ Shower/ Hygiene Clean wound with Normal Saline or wound cleanser. No tub bath. Anesthetic (Use 'Patient Medications' Section for Anesthetic Order Entry) Lidocaine applied to wound bed Edema Control - Lymphedema / Segmental Compressive Device / Other Elevate legs to the level of the heart and pump ankles as often as possible Elevate leg(s) parallel to the floor when sitting. Other: - PATIENT MUST SLEEP IN A BED. Legs are swelling due to no elevation of legs. [facility] on 03/03/23 Off-Loading Heel suspension boot - when in bed Wound Treatment Wound #16 -  Dressing: ABD Pad 5x9 (in/in) 1 x Per Day/30 Days Discharge Instructions: Cover with ABD pad Secured With: Kerlix Roll Sterile or Non-Sterile 6-ply 4.5x4 (yd/yd) 1 x Per Day/30 Days Discharge Instructions: Apply Kerlix as directed Electronic Signature(s) Signed: 04/09/2023 4:31:22 PM By: [name] PA-C Signed: 04/10/2023 12:41:22 PM By: Midge Aver MSN RN CNS WTA Entered By: Midge Aver on 04/09/2023  14:56:34 -------------------------------------------------------------------------------- Problem List Details Patient Name: Date of Service: Chad [name], Chad [name] 04/09/2023 10:15 A M Medical Record Number: [id number] Patient Account Number: [id number] Date of Birth/Sex: Treating RN: 06-04-1961 (62 y.o. 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 02/19/2023 No Yes foot 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, stage 3 10/07/2022 No Yes Inactive Problems Chad [name], Chad [name] ([id number]) [id number] Page 9 of 16 ICD-10 Code Description Active Date Inactive Date L97.322 Non-pressure chronic ulcer of left ankle with fat layer exposed 12/06/2021 12/06/2021 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 [name] Primary Care Provider: [name] Other Clinician: Referring Provider: Treating Provider/Extender: [name] Weeks in Treatment: 69 Subjective Chief Complaint Information obtained from Patient Left ankle ulcer, Left dorsal foot wound, right heel History of Present Illness (HPI) 10/08/18 on evaluation today patient actually presents to our office for initial evaluation concerning wounds that he has of the bilateral lower extremities. 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 He also has bilateral venous stasis. He apparently has been homeless currently following discharge from the hospital apparently he has been placed at [facility] which is is a skilled nursing facility locally. Nonetheless fortunately he does not show any signs of infection at this time which is good news. In fact several of the wound actually appears to be showing some signs of improvement already in my pinion. There are a couple areas in the left leg in particular there likely gonna require some sharp debridement to help clear away some necrotic tissue and help with more sufficient healing. No fevers, chills, nausea, or vomiting noted at this time. 10/15/18 on evaluation today patient actually appears to be doing very well in regard to his bilateral lower extremities. He's been tolerating the dressing changes without complication. Fortunately there does not appear to be any evidence of active infection at this time which is great news. Overall I'm actually very pleased with how this has progressed in just one visits time. Readmission: 08/14/2020 upon evaluation today patient  Dressing: ABD Pad 5x9 (in/in) 1 x Per Day/30 Days Discharge Instructions: Cover with ABD pad Secured With: Kerlix Roll Sterile or Non-Sterile 6-ply 4.5x4 (yd/yd) 1 x Per Day/30 Days Discharge Instructions: Apply Kerlix as directed Electronic Signature(s) Signed: 04/09/2023 4:31:22 PM By: [name] PA-C Signed: 04/10/2023 12:41:22 PM By: Midge Aver MSN RN CNS WTA Entered By: Midge Aver on 04/09/2023  14:56:34 -------------------------------------------------------------------------------- Problem List Details Patient Name: Date of Service: Chad [name], Chad [name] 04/09/2023 10:15 A M Medical Record Number: [id number] Patient Account Number: [id number] Date of Birth/Sex: Treating RN: 06-04-1961 (62 y.o. 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 02/19/2023 No Yes foot 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, stage 3 10/07/2022 No Yes Inactive Problems Chad [name], Chad [name] ([id number]) [id number] Page 9 of 16 ICD-10 Code Description Active Date Inactive Date L97.322 Non-pressure chronic ulcer of left ankle with fat layer exposed 12/06/2021 12/06/2021 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 [name] Primary Care Provider: [name] Other Clinician: Referring Provider: Treating Provider/Extender: [name] Weeks in Treatment: 69 Subjective Chief Complaint Information obtained from Patient Left ankle ulcer, Left dorsal foot wound, right heel History of Present Illness (HPI) 10/08/18 on evaluation today patient actually presents to our office for initial evaluation concerning wounds that he has of the bilateral lower extremities. 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 He also has bilateral venous stasis. He apparently has been homeless currently following discharge from the hospital apparently he has been placed at [facility] which is is a skilled nursing facility locally. Nonetheless fortunately he does not show any signs of infection at this time which is good news. In fact several of the wound actually appears to be showing some signs of improvement already in my pinion. There are a couple areas in the left leg in particular there likely gonna require some sharp debridement to help clear away some necrotic tissue and help with more sufficient healing. No fevers, chills, nausea, or vomiting noted at this time. 10/15/18 on evaluation today patient actually appears to be doing very well in regard to his bilateral lower extremities. He's been tolerating the dressing changes without complication. Fortunately there does not appear to be any evidence of active infection at this time which is great news. Overall I'm actually very pleased with how this has progressed in just one visits time. 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 No doubt this has a lot to do with the discomfort in the right leg he describes when he is in bed. They have been using Xeroform on the wounds on the right. He has a lot of swelling in this leg with weeping edema 04-09-2023 upon evaluation today patient appears to be doing poorly still in regard to his right leg. 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  Fortunately I do not see any signs of active infection locally or systemically at this time which is great news. No fevers, chills, nausea, vomiting, or diarrhea. With that being said I am concerned still about the fact that he seems to be getting worse especially in regard to the heels I think that he is still sitting up most of the day which I think is still causing some swelling of his leg unfortunately. Electronic Signature(s) Signed: 04/09/2023 3:39:26 PM By: [name] PA-C Entered By: [name] on 04/09/2023 15:39:26 Chad [name] ([id number]) [id number] Page 6 of 16 -------------------------------------------------------------------------------- Physical Exam Details Patient Name: Date of Service: Chad [name], Chad [name] 04/09/2023 10:15 A M Medical Record Number: [id number] Patient Account Number: [id number] Date of Birth/Sex: Treating RN: Oct 24, 1960 (62 y.o. [name] Primary Care Provider: [name] Other Clinician: Referring Provider: Treating Provider/Extender: [name] Weeks in Treatment: 62 Constitutional Well-nourished and well-hydrated in no acute distress. Respiratory normal breathing without difficulty. Psychiatric this patient is able to make decisions and demonstrates good insight into disease process. Alert and Oriented x 3. pleasant and cooperative. Notes Upon inspection patient's wound bed actually showed signs of good granulation and epithelization at this point. Fortunately I do not see any signs of active infection at this time but at the same time he does continue to have quite a bit of swelling and the wounds are not better and in some cases even a little bit worse. Electronic Signature(s) Signed: 04/09/2023 3:39:49 PM By: [name] PA-C Entered By: [name] on 04/09/2023 15:39:49 -------------------------------------------------------------------------------- Physician Orders Details Patient Name:  Date of Service: Chad [name], Chad [name] 04/09/2023 10:15 A M Medical Record Number: [id number] Patient Account Number: [id number] Date of Birth/Sex: Treating RN: [birth year] (62 y.o. 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 Nurse Visit as needed Bathing/ Shower/ Hygiene Clean wound with Normal Saline or wound cleanser. No tub bath. Anesthetic (Use 'Patient Medications' Section for Anesthetic Order Entry) Lidocaine applied to wound bed Edema Control - Lymphedema / Segmental Compressive Device / Other Elevate legs to the level of the heart and pump ankles as often as possible Elevate leg(s) parallel to the floor when sitting. Other: - PATIENT MUST SLEEP IN A BED. Legs are swelling due to no elevation of legs. [facility]KA on 03/03/23 Off-Loading Heel suspension boot - when in bed Wound Treatment Wound #16 -  in the clinic. If anything worsens or changes patient will contact our office for additional recommendations. Electronic Signature(s) Signed: 04/09/2023 3:40:45 PM By: [name] PA-C Entered By: [name] on 04/09/2023 15:40:44 -------------------------------------------------------------------------------- SuperBill Details Patient Name: Date of Service: Chad [name], Chad [name] 04/09/2023 Medical Record Number: [id number] Patient Account Number: [id number] Date of Birth/Sex: Treating RN: 16-Mar-1961 (62 y.o. 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, stage 3 Facility Procedures : CPT4 Code: [id number] Description: 62130 - WOUND CARE VISIT-LEV 5 EST PT Modifier: Quantity: 1 Physician Procedures :  CPT4 Code Description Modifier [id number] - WC PHYS LEVEL 3 - EST PT ICD-10 Diagnosis Description Chad [name] ([id number]) [id number] Page 1 [phone] Chronic venous hypertension (idiopathic) with ulcer and  inflammation of left lower extremity 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 of right foot with other specified severity I70.245 Atherosclerosis of native arteries  of left leg with ulceration of other part of foot Quantity: 1 6 of 16 Electronic Signature(s) Signed: 04/09/2023 3:41:17 PM By: [name] PA-C Previous Signature: 04/09/2023 2:58:27 PM Version By: Midge Aver MSN RN CNS WTA Entered By: [name] on 04/09/2023 15:41:16  Chad [name], Chad [name] ([id number]) [id number] Page 1 of 16 Visit Report for 04/09/2023 Chief Complaint Document Details Patient Name: Date of Service: Chad [name], Chad [name] 04/09/2023 10:15 A M Medical Record Number: [id number] Patient Account Number: [id number] Date of Birth/Sex: Treating RN: [birth year] (62 y.o. [name] Primary Care Provider: [name] Other Clinician: Referring Provider: Treating Provider/Extender: [name] Weeks in Treatment: 41 Information Obtained from: Patient Chief Complaint Left ankle ulcer, Left dorsal foot wound, right heel Electronic Signature(s) Signed: 04/09/2023 11:20:46 AM By: [name] PA-C Entered By: [name] on 04/09/2023 11:20:45 -------------------------------------------------------------------------------- HPI Details Patient Name: Date of Service: Chad [name] 04/09/2023 10:15 A M Medical Record Number: [id number] Patient Account Number: [id number] Date of Birth/Sex: Treating RN: May 15, 1961 (62 y.o. [name] Primary Care Provider: [name] Other Clinician: Referring Provider: Treating Provider/Extender: [name] Weeks in Treatment: 40 History of Present Illness HPI Description: 10/08/18 on evaluation today patient actually presents to our office for initial evaluation concerning wounds that he has of the bilateral lower extremities. 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 He also has bilateral venous stasis. He apparently has been homeless currently following discharge from the hospital apparently he has been placed at [facility] which is is a skilled nursing facility locally. Nonetheless fortunately he does not show any signs of infection at this time which is good news. In fact several of the wound  actually appears to be showing some signs of improvement already in my pinion. There are a couple areas in the left leg in particular there likely gonna require some sharp debridement to help clear away some necrotic tissue and help with more sufficient healing. No fevers, chills, nausea, or vomiting noted at this time. 10/15/18 on evaluation today patient actually appears to be doing very well in regard to his bilateral lower extremities. He's been tolerating the dressing changes without complication. Fortunately there does not appear to be any evidence of active infection at this time which is great news. Overall I'm actually very pleased with how this has progressed in just one visits time. Readmission: 08/14/2020 upon evaluation today patient presents for re-evaluation here in our clinic. He is having issues with his left ankle region as well as his right toe and his right heel. He tells me that the toe and heel actually began as a area that was itching that he was scratching and then subsequently opened up into wounds. These may have been abscess areas I presume based on what I am seeing currently. With regard to his left ankle region he tells me this was a similar type Chad [name], Chad [name] ([id number]) [id number] Page 2 of 16 occurrence although he does have venous stasis this very well may be more of a venous leg ulcer more than anything. Nonetheless I do believe that the patient would benefit from appropriate and aggressive wound care to try to help get things under better control here. He does have history of a stroke on the left side affecting him to some degree there that he is able to stand although he does have some residual weakness. Otherwise again the patient does have chronic venous insufficiency as previously noted. 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 No doubt this has a lot to do with the discomfort in the right leg he describes when he is in bed. They have been using Xeroform on the wounds on the right. He has a lot of swelling in this leg with weeping edema 04-09-2023 upon evaluation today patient appears to be doing poorly still in regard to his right leg. 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  Fortunately I do not see any signs of active infection locally or systemically at this time which is great news. No fevers, chills, nausea, vomiting, or diarrhea. With that being said I am concerned still about the fact that he seems to be getting worse especially in regard to the heels I think that he is still sitting up most of the day which I think is still causing some swelling of his leg unfortunately. Electronic Signature(s) Signed: 04/09/2023 3:39:26 PM By: [name] PA-C Entered By: [name] on 04/09/2023 15:39:26 Chad [name] ([id number]) [id number] Page 6 of 16 -------------------------------------------------------------------------------- Physical Exam Details Patient Name: Date of Service: Chad [name], Chad [name] 04/09/2023 10:15 A M Medical Record Number: [id number] Patient Account Number: [id number] Date of Birth/Sex: Treating RN: Oct 24, 1960 (62 y.o. [name] Primary Care Provider: [name] Other Clinician: Referring Provider: Treating Provider/Extender: [name] Weeks in Treatment: 62 Constitutional Well-nourished and well-hydrated in no acute distress. Respiratory normal breathing without difficulty. Psychiatric this patient is able to make decisions and demonstrates good insight into disease process. Alert and Oriented x 3. pleasant and cooperative. Notes Upon inspection patient's wound bed actually showed signs of good granulation and epithelization at this point. Fortunately I do not see any signs of active infection at this time but at the same time he does continue to have quite a bit of swelling and the wounds are not better and in some cases even a little bit worse. Electronic Signature(s) Signed: 04/09/2023 3:39:49 PM By: [name] PA-C Entered By: [name] on 04/09/2023 15:39:49 -------------------------------------------------------------------------------- Physician Orders Details Patient Name:  Date of Service: Chad [name], Chad [name] 04/09/2023 10:15 A M Medical Record Number: [id number] Patient Account Number: [id number] Date of Birth/Sex: Treating RN: [birth year] (62 y.o. 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 Nurse Visit as needed Bathing/ Shower/ Hygiene Clean wound with Normal Saline or wound cleanser. No tub bath. Anesthetic (Use 'Patient Medications' Section for Anesthetic Order Entry) Lidocaine applied to wound bed Edema Control - Lymphedema / Segmental Compressive Device / Other Elevate legs to the level of the heart and pump ankles as often as possible Elevate leg(s) parallel to the floor when sitting. Other: - PATIENT MUST SLEEP IN A BED. Legs are swelling due to no elevation of legs. [facility]KA on 03/03/23 Off-Loading Heel suspension boot - when in bed Wound Treatment Wound #16 -  Dressing: ABD Pad 5x9 (in/in) 1 x Per Day/30 Days Discharge Instructions: Cover with ABD pad Secured With: Kerlix Roll Sterile or Non-Sterile 6-ply 4.5x4 (yd/yd) 1 x Per Day/30 Days Discharge Instructions: Apply Kerlix as directed Electronic Signature(s) Signed: 04/09/2023 4:31:22 PM By: [name] PA-C Signed: 04/10/2023 12:41:22 PM By: Midge Aver MSN RN CNS WTA Entered By: Midge Aver on 04/09/2023  14:56:34 -------------------------------------------------------------------------------- Problem List Details Patient Name: Date of Service: Chad [name], Chad [name] 04/09/2023 10:15 A M Medical Record Number: [id number] Patient Account Number: [id number] Date of Birth/Sex: Treating RN: 06-04-1961 (62 y.o. 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 02/19/2023 No Yes foot 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, stage 3 10/07/2022 No Yes Inactive Problems Chad [name], Chad [name] ([id number]) [id number] Page 9 of 16 ICD-10 Code Description Active Date Inactive Date L97.322 Non-pressure chronic ulcer of left ankle with fat layer exposed 12/06/2021 12/06/2021 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 [name] Primary Care Provider: [name] Other Clinician: Referring Provider: Treating Provider/Extender: [name] Weeks in Treatment: 69 Subjective Chief Complaint Information obtained from Patient Left ankle ulcer, Left dorsal foot wound, right heel History of Present Illness (HPI) 10/08/18 on evaluation today patient actually presents to our office for initial evaluation concerning wounds that he has of the bilateral lower extremities. 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 He also has bilateral venous stasis. He apparently has been homeless currently following discharge from the hospital apparently he has been placed at [facility] which is is a skilled nursing facility locally. Nonetheless fortunately he does not show any signs of infection at this time which is good news. In fact several of the wound actually appears to be showing some signs of improvement already in my pinion. There are a couple areas in the left leg in particular there likely gonna require some sharp debridement to help clear away some necrotic tissue and help with more sufficient healing. No fevers, chills, nausea, or vomiting noted at this time. 10/15/18 on evaluation today patient actually appears to be doing very well in regard to his bilateral lower extremities. He's been tolerating the dressing changes without complication. Fortunately there does not appear to be any evidence of active infection at this time which is great news. Overall I'm actually very pleased with how this has progressed in just one visits time. Readmission: 08/14/2020 upon evaluation today patient  in the clinic. If anything worsens or changes patient will contact our office for additional recommendations. Electronic Signature(s) Signed: 04/09/2023 3:40:45 PM By: [name] PA-C Entered By: [name] on 04/09/2023 15:40:44 -------------------------------------------------------------------------------- SuperBill Details Patient Name: Date of Service: Chad [name], Chad [name] 04/09/2023 Medical Record Number: [id number] Patient Account Number: [id number] Date of Birth/Sex: Treating RN: 16-Mar-1961 (62 y.o. 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, stage 3 Facility Procedures : CPT4 Code: [id number] Description: 62130 - WOUND CARE VISIT-LEV 5 EST PT Modifier: Quantity: 1 Physician Procedures :  CPT4 Code Description Modifier [id number] 99213 - WC PHYS LEVEL 3 - EST PT ICD-10 Diagnosis Description Chad [name] ([id number]) [id number] Page 1 [phone] Chronic venous hypertension (idiopathic) with ulcer and  inflammation of left lower extremity 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 of right foot with other specified severity I70.245 Atherosclerosis of native arteries  of left leg with ulceration of other part of foot Quantity: 1 6 of 16 Electronic Signature(s) Signed: 04/09/2023 3:41:17 PM By: [name] PA-C Previous Signature: 04/09/2023 2:58:27 PM Version By: Midge Aver MSN RN CNS WTA Entered By: [name] on 04/09/2023 15:41:16  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 No doubt this has a lot to do with the discomfort in the right leg he describes when he is in bed. They have been using Xeroform on the wounds on the right. He has a lot of swelling in this leg with weeping edema 04-09-2023 upon evaluation today patient appears to be doing poorly still in regard to his right leg. 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  Fortunately I do not see any signs of active infection locally or systemically at this time which is great news. No fevers, chills, nausea, vomiting, or diarrhea. With that being said I am concerned still about the fact that he seems to be getting worse especially in regard to the heels I think that he is still sitting up most of the day which I think is still causing some swelling of his leg unfortunately. Electronic Signature(s) Signed: 04/09/2023 3:39:26 PM By: [name] PA-C Entered By: [name] on 04/09/2023 15:39:26 Chad [name] ([id number]) [id number] Page 6 of 16 -------------------------------------------------------------------------------- Physical Exam Details Patient Name: Date of Service: Chad [name], Chad [name] 04/09/2023 10:15 A M Medical Record Number: [id number] Patient Account Number: [id number] Date of Birth/Sex: Treating RN: Oct 24, 1960 (62 y.o. [name] Primary Care Provider: [name] Other Clinician: Referring Provider: Treating Provider/Extender: [name] Weeks in Treatment: 62 Constitutional Well-nourished and well-hydrated in no acute distress. Respiratory normal breathing without difficulty. Psychiatric this patient is able to make decisions and demonstrates good insight into disease process. Alert and Oriented x 3. pleasant and cooperative. Notes Upon inspection patient's wound bed actually showed signs of good granulation and epithelization at this point. Fortunately I do not see any signs of active infection at this time but at the same time he does continue to have quite a bit of swelling and the wounds are not better and in some cases even a little bit worse. Electronic Signature(s) Signed: 04/09/2023 3:39:49 PM By: [name] PA-C Entered By: [name] on 04/09/2023 15:39:49 -------------------------------------------------------------------------------- Physician Orders Details Patient Name:  Date of Service: Chad [name], Chad [name] 04/09/2023 10:15 A M Medical Record Number: [id number] Patient Account Number: [id number] Date of Birth/Sex: Treating RN: [birth year] (62 y.o. 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 Nurse Visit as needed Bathing/ Shower/ Hygiene Clean wound with Normal Saline or wound cleanser. No tub bath. Anesthetic (Use 'Patient Medications' Section for Anesthetic Order Entry) Lidocaine applied to wound bed Edema Control - Lymphedema / Segmental Compressive Device / Other Elevate legs to the level of the heart and pump ankles as often as possible Elevate leg(s) parallel to the floor when sitting. Other: - PATIENT MUST SLEEP IN A BED. Legs are swelling due to no elevation of legs. [facility] on 03/03/23 Off-Loading Heel suspension boot - when in bed Wound Treatment Wound #16 -

## 2023-04-10 ENCOUNTER — Other Ambulatory Visit: Payer: Self-pay | Admitting: Internal Medicine

## 2023-04-10 NOTE — Progress Notes (Signed)
Dorsal Foot Wound Location: Gradually Appeared Pressure Injury Pressure Injury Wounding Event: Pressure Ulcer Pressure Ulcer Venous Leg Ulcer Primary Etiology: Anemia, Chronic Obstructive Anemia, Chronic Obstructive Anemia, Chronic Obstructive Comorbid History: Pulmonary Disease (COPD), Coronary Pulmonary Disease (COPD), Coronary Pulmonary Disease (COPD), Coronary Artery Disease, Peripheral Arterial Artery Disease, Peripheral Arterial Artery Disease, Peripheral Arterial Disease, Peripheral Venous Disease, Disease, Peripheral Venous Disease, Disease, Peripheral Venous Disease, Hepatitis C, Osteoarthritis, Hepatitis C, Osteoarthritis, Hepatitis C, Osteoarthritis, Neuropathy, Received Chemotherapy Neuropathy, Received Chemotherapy Neuropathy, Received Chemotherapy 09/26/2022 09/26/2022 03/02/2023 Date Acquired: 26 25 2  Weeks of Treatment: Open Open Open Wound Status: No No No Wound Recurrence: Yes No Yes Clustered Wound: 8x4x0.2 2x1x0.1 3x3.5x0.1 Measurements L x W x D (cm) 25.133 1.571 8.247 A (cm) : rea 5.027 0.157 0.825 Volume (cm) : -2568.00% -185.60% -366.70% % Reduction in Area: -2573.90% -185.50% -366.10% % Reduction in  Volume: Category/Stage III Category/Stage III Full Thickness Without Exposed Classification: Support Structures Medium Medium Medium Exudate A mount: Serosanguineous Serosanguineous Serous Exudate Type: red, brown red, brown amber Exudate Color: Small (1-33%) Small (1-33%) Small (1-33%) Granulation Amount: Red, Pink Red, Pink Red, Pink Granulation Quality: Large (67-100%) Medium (34-66%) Small (1-33%) Necrotic Amount: Eschar, Adherent Slough Adherent [employer] Necrotic Tissue: Fat Layer (Subcutaneous Tissue): Yes Fat Layer (Subcutaneous Tissue): Yes Fat Layer (Subcutaneous Tissue): Yes Exposed Structures: Fascia: No Fascia: No Fascia: No Tendon: No Tendon: No Tendon: No Muscle: No Muscle: No Muscle: No Joint: No Joint: No Joint: No Bone: No Bone: No Bone: No None None Medium (34-66%) Epithelialization: Wound Number: 22 N/A N/A Photos: N/A Bebe Shaggy ([id number]) [id number] Page 6 of 14 Right, Anterior Lower Leg N/A N/A Wound Location: Pressure Injury N/A N/A Wounding Event: Venous Leg Ulcer N/A N/A Primary Etiology: Anemia, Chronic Obstructive N/A N/A Comorbid History: Pulmonary Disease (COPD), Coronary Artery Disease, Peripheral Arterial Disease, Peripheral Venous Disease, Hepatitis C, Osteoarthritis, Neuropathy, Received Chemotherapy 03/02/2023 N/A N/A Date Acquired: 2 N/A N/A Weeks of Treatment: Open N/A N/A Wound Status: No N/A N/A Wound Recurrence: Yes N/A N/A Clustered Wound: 4.4x8x0.1 N/A N/A Measurements L x W x D (cm) 27.646 N/A N/A A (cm) : rea 2.765 N/A N/A Volume (cm) : -1073.40% N/A N/A % Reduction in Area: -487.00% N/A N/A % Reduction in Volume: Full Thickness Without Exposed N/A N/A Classification: Support Structures Medium N/A N/A Exudate A mount: Serous N/A N/A Exudate Type: amber N/A N/A Exudate Color: Medium (34-66%) N/A N/A Granulation Amount: Red, Pink N/A  N/A Granulation Quality: Small (1-33%) N/A N/A Necrotic Amount: Adherent Slough N/A N/A Necrotic Tissue: Fat Layer (Subcutaneous Tissue): Yes N/A N/A Exposed Structures: Fascia: No Tendon: No Muscle: No Joint: No Bone: No Small (1-33%) N/A N/A Epithelialization: Treatment Notes Electronic Signature(s) Signed: 04/09/2023 2:45:17 PM By: Midge Aver MSN RN CNS WTA Entered By: Midge Aver on 04/09/2023 14:45:17 -------------------------------------------------------------------------------- Multi-Disciplinary Care Plan Details Patient Name: Date of Service: Chad [name] BERT 04/09/2023 10:15 A M Medical Record Number: [id number] Patient Account Number: [id number] Date of Birth/Sex: Treating RN: [birth year] (62 y.o. [name] Primary Care Keon Pender: [name] Other Clinician: Referring Demitrius Crass: Treating Crispin Vogel/Extender: [name] Weeks in Treatment: 69 Active Inactive Venous Leg Ulcer Nursing Diagnoses: Knowledge deficit related to disease process and management Goals: Patient will maintain optimal edema control MONA, THOMMEN ([id number]) [phone].pdf Page 7 of 14 Date Initiated: 12/06/2021 Target Resolution Date: 04/26/2023 Goal Status: Active Patient/caregiver will verbalize understanding of disease process and disease management Date Initiated: 12/06/2021 Date Inactivated: 12/19/2022 Target Resolution Date: 11/27/2022  Dorsal Foot Wound Location: Gradually Appeared Pressure Injury Pressure Injury Wounding Event: Pressure Ulcer Pressure Ulcer Venous Leg Ulcer Primary Etiology: Anemia, Chronic Obstructive Anemia, Chronic Obstructive Anemia, Chronic Obstructive Comorbid History: Pulmonary Disease (COPD), Coronary Pulmonary Disease (COPD), Coronary Pulmonary Disease (COPD), Coronary Artery Disease, Peripheral Arterial Artery Disease, Peripheral Arterial Artery Disease, Peripheral Arterial Disease, Peripheral Venous Disease, Disease, Peripheral Venous Disease, Disease, Peripheral Venous Disease, Hepatitis C, Osteoarthritis, Hepatitis C, Osteoarthritis, Hepatitis C, Osteoarthritis, Neuropathy, Received Chemotherapy Neuropathy, Received Chemotherapy Neuropathy, Received Chemotherapy 09/26/2022 09/26/2022 03/02/2023 Date Acquired: 26 25 2  Weeks of Treatment: Open Open Open Wound Status: No No No Wound Recurrence: Yes No Yes Clustered Wound: 8x4x0.2 2x1x0.1 3x3.5x0.1 Measurements L x W x D (cm) 25.133 1.571 8.247 A (cm) : rea 5.027 0.157 0.825 Volume (cm) : -2568.00% -185.60% -366.70% % Reduction in Area: -2573.90% -185.50% -366.10% % Reduction in  Volume: Category/Stage III Category/Stage III Full Thickness Without Exposed Classification: Support Structures Medium Medium Medium Exudate A mount: Serosanguineous Serosanguineous Serous Exudate Type: red, brown red, brown amber Exudate Color: Small (1-33%) Small (1-33%) Small (1-33%) Granulation Amount: Red, Pink Red, Pink Red, Pink Granulation Quality: Large (67-100%) Medium (34-66%) Small (1-33%) Necrotic Amount: Eschar, Adherent Slough Adherent [employer] Necrotic Tissue: Fat Layer (Subcutaneous Tissue): Yes Fat Layer (Subcutaneous Tissue): Yes Fat Layer (Subcutaneous Tissue): Yes Exposed Structures: Fascia: No Fascia: No Fascia: No Tendon: No Tendon: No Tendon: No Muscle: No Muscle: No Muscle: No Joint: No Joint: No Joint: No Bone: No Bone: No Bone: No None None Medium (34-66%) Epithelialization: Wound Number: 22 N/A N/A Photos: N/A Bebe Shaggy ([id number]) [id number] Page 6 of 14 Right, Anterior Lower Leg N/A N/A Wound Location: Pressure Injury N/A N/A Wounding Event: Venous Leg Ulcer N/A N/A Primary Etiology: Anemia, Chronic Obstructive N/A N/A Comorbid History: Pulmonary Disease (COPD), Coronary Artery Disease, Peripheral Arterial Disease, Peripheral Venous Disease, Hepatitis C, Osteoarthritis, Neuropathy, Received Chemotherapy 03/02/2023 N/A N/A Date Acquired: 2 N/A N/A Weeks of Treatment: Open N/A N/A Wound Status: No N/A N/A Wound Recurrence: Yes N/A N/A Clustered Wound: 4.4x8x0.1 N/A N/A Measurements L x W x D (cm) 27.646 N/A N/A A (cm) : rea 2.765 N/A N/A Volume (cm) : -1073.40% N/A N/A % Reduction in Area: -487.00% N/A N/A % Reduction in Volume: Full Thickness Without Exposed N/A N/A Classification: Support Structures Medium N/A N/A Exudate A mount: Serous N/A N/A Exudate Type: amber N/A N/A Exudate Color: Medium (34-66%) N/A N/A Granulation Amount: Red, Pink N/A  N/A Granulation Quality: Small (1-33%) N/A N/A Necrotic Amount: Adherent Slough N/A N/A Necrotic Tissue: Fat Layer (Subcutaneous Tissue): Yes N/A N/A Exposed Structures: Fascia: No Tendon: No Muscle: No Joint: No Bone: No Small (1-33%) N/A N/A Epithelialization: Treatment Notes Electronic Signature(s) Signed: 04/09/2023 2:45:17 PM By: Midge Aver MSN RN CNS WTA Entered By: Midge Aver on 04/09/2023 14:45:17 -------------------------------------------------------------------------------- Multi-Disciplinary Care Plan Details Patient Name: Date of Service: Chad [name] BERT 04/09/2023 10:15 A M Medical Record Number: [id number] Patient Account Number: [id number] Date of Birth/Sex: Treating RN: [birth year] (62 y.o. [name] Primary Care Keon Pender: [name] Other Clinician: Referring Demitrius Crass: Treating Crispin Vogel/Extender: [name] Weeks in Treatment: 69 Active Inactive Venous Leg Ulcer Nursing Diagnoses: Knowledge deficit related to disease process and management Goals: Patient will maintain optimal edema control MONA, THOMMEN ([id number]) [phone].pdf Page 7 of 14 Date Initiated: 12/06/2021 Target Resolution Date: 04/26/2023 Goal Status: Active Patient/caregiver will verbalize understanding of disease process and disease management Date Initiated: 12/06/2021 Date Inactivated: 12/19/2022 Target Resolution Date: 11/27/2022  ALLIN, BITHELL ([id number]) [id number] Page 1 of 14 Visit Report for 04/09/2023 Arrival Information Details Patient Name: Date of Service: DETRAVIOUS, KIRCHEN 04/09/2023 10:15 A M Medical Record Number: [id number] Patient Account Number: [id number] Date of Birth/Sex: Treating RN: 1961-05-09 (62 y.o. [name] Primary Care Jin Capote: [name] Other Clinician: Referring Adira Limburg: Treating Saveon Plant/Extender: [name] in Treatment: 82 Visit Information History Since Last Visit Added or deleted any medications: No Patient Arrived: Wheel Chair Any new allergies or adverse reactions: No Arrival Time: 11:00 Has Dressing in Place as Prescribed: Yes Accompanied By: self Pain Present Now: No Transfer Assistance: None Patient Identification Verified: Yes Secondary Verification Process Completed: Yes Patient Requires Transmission-Based No Precautions: Patient Has Alerts: Yes Patient Alerts: Patient on Blood Thinner NOT diabetic aspirin 81mg  Lives [facility] ABI R 0.81 11/05/22 ABI L 0.59 11/05/22 [facility] 03/03/23 Electronic Signature(s) Signed: 04/09/2023 2:43:37 PM By: Midge Aver MSN RN CNS WTA Entered By: Midge Aver on 04/09/2023 14:43:37 -------------------------------------------------------------------------------- Clinic Level of Care Assessment Details Patient Name: Date of Service: KEJON, ORSBORN 04/09/2023 10:15 A M Medical Record Number: [id number] Patient Account Number: [id number] Date of Birth/Sex: Treating RN: [birth year] (62 y.o. [name] Primary Care Yug Loria: [name] Other Clinician: Referring Reese Stockman: Treating Quincy Prisco/Extender: [name] Weeks in Treatment: 69 Clinic Level of Care Assessment Items TOOL 4 Quantity Score X- 1 0 Use when only an EandM is performed on FOLLOW-UP visit ASSESSMENTS - Nursing Assessment / Reassessment NIVEN, CAPPUCCIO ([id number])  [id number] Page 2 of 14 X- 1 10 Reassessment of Co-morbidities (includes updates in patient status) X- 1 5 Reassessment of Adherence to Treatment Plan ASSESSMENTS - Wound and Skin A ssessment / Reassessment []  - 0 Simple Wound Assessment / Reassessment - one wound X- 4 5 Complex Wound Assessment / Reassessment - multiple wounds []  - 0 Dermatologic / Skin Assessment (not related to wound area) ASSESSMENTS - Focused Assessment []  - 0 Circumferential Edema Measurements - multi extremities []  - 0 Nutritional Assessment / Counseling / Intervention []  - 0 Lower Extremity Assessment (monofilament, tuning fork, pulses) []  - 0 Peripheral Arterial Disease Assessment (using hand held doppler) ASSESSMENTS - Ostomy and/or Continence Assessment and Care []  - 0 Incontinence Assessment and Management []  - 0 Ostomy Care Assessment and Management (repouching, etc.) PROCESS - Coordination of Care []  - 0 Simple Patient / Family Education for ongoing care X- 1 20 Complex (extensive) Patient / Family Education for ongoing care X- 1 10 Staff obtains Chiropractor, Records, T Results / Process Orders est []  - 0 Staff telephones HHA, Nursing Homes / Clarify orders / etc []  - 0 Routine Transfer to another Facility (non-emergent condition) []  - 0 Routine Hospital Admission (non-emergent condition) []  - 0 New Admissions / Manufacturing engineer / Ordering NPWT Apligraf, etc. , []  - 0 Emergency Hospital Admission (emergent condition) []  - 0 Simple Discharge Coordination X- 1 15 Complex (extensive) Discharge Coordination PROCESS - Special Needs []  - 0 Pediatric / Minor Patient Management []  - 0 Isolation Patient Management []  - 0 Hearing / Language / Visual special needs []  - 0 Assessment of Community assistance (transportation, D/C planning, etc.) []  - 0 Additional assistance / Altered mentation []  - 0 Support Surface(s) Assessment (bed, cushion, seat,  etc.) INTERVENTIONS - Wound Cleansing / Measurement []  - 0 Simple Wound Cleansing - one wound X- 4 5 Complex Wound Cleansing - multiple wounds []  - 0 Wound Imaging (photographs - any number of wounds) []  - 0 Wound  Dorsal Foot Wound Location: Gradually Appeared Pressure Injury Pressure Injury Wounding Event: Pressure Ulcer Pressure Ulcer Venous Leg Ulcer Primary Etiology: Anemia, Chronic Obstructive Anemia, Chronic Obstructive Anemia, Chronic Obstructive Comorbid History: Pulmonary Disease (COPD), Coronary Pulmonary Disease (COPD), Coronary Pulmonary Disease (COPD), Coronary Artery Disease, Peripheral Arterial Artery Disease, Peripheral Arterial Artery Disease, Peripheral Arterial Disease, Peripheral Venous Disease, Disease, Peripheral Venous Disease, Disease, Peripheral Venous Disease, Hepatitis C, Osteoarthritis, Hepatitis C, Osteoarthritis, Hepatitis C, Osteoarthritis, Neuropathy, Received Chemotherapy Neuropathy, Received Chemotherapy Neuropathy, Received Chemotherapy 09/26/2022 09/26/2022 03/02/2023 Date Acquired: 26 25 2  Weeks of Treatment: Open Open Open Wound Status: No No No Wound Recurrence: Yes No Yes Clustered Wound: 8x4x0.2 2x1x0.1 3x3.5x0.1 Measurements L x W x D (cm) 25.133 1.571 8.247 A (cm) : rea 5.027 0.157 0.825 Volume (cm) : -2568.00% -185.60% -366.70% % Reduction in Area: -2573.90% -185.50% -366.10% % Reduction in  Volume: Category/Stage III Category/Stage III Full Thickness Without Exposed Classification: Support Structures Medium Medium Medium Exudate A mount: Serosanguineous Serosanguineous Serous Exudate Type: red, brown red, brown amber Exudate Color: Small (1-33%) Small (1-33%) Small (1-33%) Granulation Amount: Red, Pink Red, Pink Red, Pink Granulation Quality: Large (67-100%) Medium (34-66%) Small (1-33%) Necrotic Amount: Eschar, Adherent Slough Adherent [employer] Necrotic Tissue: Fat Layer (Subcutaneous Tissue): Yes Fat Layer (Subcutaneous Tissue): Yes Fat Layer (Subcutaneous Tissue): Yes Exposed Structures: Fascia: No Fascia: No Fascia: No Tendon: No Tendon: No Tendon: No Muscle: No Muscle: No Muscle: No Joint: No Joint: No Joint: No Bone: No Bone: No Bone: No None None Medium (34-66%) Epithelialization: Wound Number: 22 N/A N/A Photos: N/A Bebe Shaggy ([id number]) [id number] Page 6 of 14 Right, Anterior Lower Leg N/A N/A Wound Location: Pressure Injury N/A N/A Wounding Event: Venous Leg Ulcer N/A N/A Primary Etiology: Anemia, Chronic Obstructive N/A N/A Comorbid History: Pulmonary Disease (COPD), Coronary Artery Disease, Peripheral Arterial Disease, Peripheral Venous Disease, Hepatitis C, Osteoarthritis, Neuropathy, Received Chemotherapy 03/02/2023 N/A N/A Date Acquired: 2 N/A N/A Weeks of Treatment: Open N/A N/A Wound Status: No N/A N/A Wound Recurrence: Yes N/A N/A Clustered Wound: 4.4x8x0.1 N/A N/A Measurements L x W x D (cm) 27.646 N/A N/A A (cm) : rea 2.765 N/A N/A Volume (cm) : -1073.40% N/A N/A % Reduction in Area: -487.00% N/A N/A % Reduction in Volume: Full Thickness Without Exposed N/A N/A Classification: Support Structures Medium N/A N/A Exudate A mount: Serous N/A N/A Exudate Type: amber N/A N/A Exudate Color: Medium (34-66%) N/A N/A Granulation Amount: Red, Pink N/A  N/A Granulation Quality: Small (1-33%) N/A N/A Necrotic Amount: Adherent Slough N/A N/A Necrotic Tissue: Fat Layer (Subcutaneous Tissue): Yes N/A N/A Exposed Structures: Fascia: No Tendon: No Muscle: No Joint: No Bone: No Small (1-33%) N/A N/A Epithelialization: Treatment Notes Electronic Signature(s) Signed: 04/09/2023 2:45:17 PM By: Midge Aver MSN RN CNS WTA Entered By: Midge Aver on 04/09/2023 14:45:17 -------------------------------------------------------------------------------- Multi-Disciplinary Care Plan Details Patient Name: Date of Service: Chad [name] BERT 04/09/2023 10:15 A M Medical Record Number: [id number] Patient Account Number: [id number] Date of Birth/Sex: Treating RN: [birth year] (62 y.o. [name] Primary Care Keon Pender: [name] Other Clinician: Referring Demitrius Crass: Treating Crispin Vogel/Extender: [name] Weeks in Treatment: 69 Active Inactive Venous Leg Ulcer Nursing Diagnoses: Knowledge deficit related to disease process and management Goals: Patient will maintain optimal edema control MONA, THOMMEN ([id number]) [phone].pdf Page 7 of 14 Date Initiated: 12/06/2021 Target Resolution Date: 04/26/2023 Goal Status: Active Patient/caregiver will verbalize understanding of disease process and disease management Date Initiated: 12/06/2021 Date Inactivated: 12/19/2022 Target Resolution Date: 11/27/2022  Goal Status: Met Interventions: Assess peripheral edema status every visit. Compression as ordered Notes: Electronic Signature(s) Signed: 04/09/2023 2:58:47 PM By: Midge Aver MSN RN CNS WTA Entered By: Midge Aver on 04/09/2023 14:58:47 -------------------------------------------------------------------------------- Pain Assessment Details Patient Name: Date of Service: VENSON, STUMBO 04/09/2023 10:15 A M Medical Record Number: [id number] Patient Account Number: [id number] Date of Birth/Sex: Treating  RN: [birth year] (62 y.o. [name] Primary Care Tyrann Donaho: [name] Other Clinician: Referring Maryetta Shafer: Treating Jariya Reichow/Extender: [name] Weeks in Treatment: 69 Active Problems Location of Pain Severity and Description of Pain Patient Has Paino No Site Locations Pain Management and Medication Current Pain Management: Electronic Signature(s) Signed: 04/09/2023 2:44:03 PM By: Midge Aver MSN RN CNS WTA Entered By: Midge Aver on 04/09/2023 14:44:03 [name] ([id number]) [id number] Page 8 of 14 -------------------------------------------------------------------------------- Patient/Caregiver Education Details Patient Name: Date of Service: KEIF, KIERAN 9/12/2024andnbsp10:15 A M Medical Record Number: [id number] Patient Account Number: [id number] Date of Birth/Gender: Treating RN: March 08, 1961 (62 y.o. [name] Primary Care Physician: [name] Other Clinician: Referring Physician: Treating Physician/Extender: [name] in Treatment: 38 Education Assessment Education Provided To: Patient Education Topics Provided Wound/Skin Impairment: Handouts: Caring for Your Ulcer Methods: Explain/Verbal Responses: State content correctly Electronic Signature(s) Signed: 04/10/2023 12:41:22 PM By: Midge Aver MSN RN CNS WTA Entered By: Midge Aver on 04/09/2023 14:59:36 -------------------------------------------------------------------------------- Wound Assessment Details Patient Name: Date of Service: EZEKEIL, SCHOLTES 04/09/2023 10:15 A M Medical Record Number: [id number] Patient Account Number: [id number] Date of Birth/Sex: Treating RN: [birth year] (63 y.o. [name] Primary Care Ahmiya Abee: [name] Other Clinician: Referring Devann Cribb: Treating Sinclair Alligood/Extender: [name] Weeks in Treatment: 69 Wound Status Wound Number: 16 Primary Pressure  Ulcer Etiology: Wound Location: Right Calcaneus Wound Open Wounding Event: Gradually Appeared Status: Date Acquired: 09/26/2022 Comorbid Anemia, Chronic Obstructive Pulmonary Disease (COPD), Coronary Weeks Of Treatment: 26 History: Artery Disease, Peripheral Arterial Disease, Peripheral Venous Clustered Wound: Yes Disease, Hepatitis C, Osteoarthritis, Neuropathy, Received Chemotherapy Photos PARESH, TUTWILER ([id number]) [phone].pdf Page 9 of 14 Wound Measurements Length: (cm) 8 Width: (cm) 4 Depth: (cm) 0.2 Area: (cm) 25.133 Volume: (cm) 5.027 % Reduction in Area: -2568% % Reduction in Volume: -2573.9% Epithelialization: None Wound Description Classification: Category/Stage III Exudate Amount: Medium Exudate Type: Serosanguineous Exudate Color: red, brown Slough/Fibrino Yes Wound Bed Granulation Amount: Small (1-33%) Exposed Structure Granulation Quality: Red, Pink Fascia Exposed: No Necrotic Amount: Large (67-100%) Fat Layer (Subcutaneous Tissue) Exposed: Yes Necrotic Quality: Eschar, Adherent Slough Tendon Exposed: No Muscle Exposed: No Joint Exposed: No Bone Exposed: No Treatment Notes Wound #16 (Calcaneus) Wound Laterality: Right Cleanser Soap and Water Discharge Instruction: Gently cleanse wound with antibacterial soap, rinse and pat dry prior to dressing wounds Wound Cleanser Discharge Instruction: Wash your hands with soap and water. Remove old dressing, discard into plastic bag and place into trash. Cleanse the wound with Wound Cleanser prior to applying a clean dressing using gauze sponges, not tissues or cotton balls. Do not scrub or use excessive force. Pat dry using gauze sponges, not tissue or cotton balls. Peri-Wound Care Topical Primary Dressing Silvercel Small 2x2 (in/in) Discharge Instruction: Apply Silvercel Small 2x2 (in/in) as instructed Secondary Dressing ABD Pad 5x9 (in/in) Discharge Instruction: Cover with ABD  pad Secured With Kerlix Roll Sterile or Non-Sterile 6-ply 4.5x4 (yd/yd) Discharge Instruction: Apply Kerlix as directed Compression Wrap Compression Stockings Add-Ons Electronic Signature(s) Signed: 04/10/2023 12:41:22 PM By: Midge Aver MSN RN CNS WTA Entered By: Midge Aver on 04/09/2023 11:29:10 Steuart,  Goal Status: Met Interventions: Assess peripheral edema status every visit. Compression as ordered Notes: Electronic Signature(s) Signed: 04/09/2023 2:58:47 PM By: Midge Aver MSN RN CNS WTA Entered By: Midge Aver on 04/09/2023 14:58:47 -------------------------------------------------------------------------------- Pain Assessment Details Patient Name: Date of Service: VENSON, STUMBO 04/09/2023 10:15 A M Medical Record Number: [id number] Patient Account Number: [id number] Date of Birth/Sex: Treating  RN: [birth year] (62 y.o. [name] Primary Care Tyrann Donaho: [name] Other Clinician: Referring Maryetta Shafer: Treating Jariya Reichow/Extender: [name] Weeks in Treatment: 69 Active Problems Location of Pain Severity and Description of Pain Patient Has Paino No Site Locations Pain Management and Medication Current Pain Management: Electronic Signature(s) Signed: 04/09/2023 2:44:03 PM By: Midge Aver MSN RN CNS WTA Entered By: Midge Aver on 04/09/2023 14:44:03 [name] ([id number]) [id number] Page 8 of 14 -------------------------------------------------------------------------------- Patient/Caregiver Education Details Patient Name: Date of Service: KEIF, KIERAN 9/12/2024andnbsp10:15 A M Medical Record Number: [id number] Patient Account Number: [id number] Date of Birth/Gender: Treating RN: March 08, 1961 (62 y.o. [name] Primary Care Physician: [name] Other Clinician: Referring Physician: Treating Physician/Extender: [name] in Treatment: 38 Education Assessment Education Provided To: Patient Education Topics Provided Wound/Skin Impairment: Handouts: Caring for Your Ulcer Methods: Explain/Verbal Responses: State content correctly Electronic Signature(s) Signed: 04/10/2023 12:41:22 PM By: Midge Aver MSN RN CNS WTA Entered By: Midge Aver on 04/09/2023 14:59:36 -------------------------------------------------------------------------------- Wound Assessment Details Patient Name: Date of Service: EZEKEIL, SCHOLTES 04/09/2023 10:15 A M Medical Record Number: [id number] Patient Account Number: [id number] Date of Birth/Sex: Treating RN: [birth year] (63 y.o. [name] Primary Care Ahmiya Abee: [name] Other Clinician: Referring Devann Cribb: Treating Sinclair Alligood/Extender: [name] Weeks in Treatment: 69 Wound Status Wound Number: 16 Primary Pressure  Ulcer Etiology: Wound Location: Right Calcaneus Wound Open Wounding Event: Gradually Appeared Status: Date Acquired: 09/26/2022 Comorbid Anemia, Chronic Obstructive Pulmonary Disease (COPD), Coronary Weeks Of Treatment: 26 History: Artery Disease, Peripheral Arterial Disease, Peripheral Venous Clustered Wound: Yes Disease, Hepatitis C, Osteoarthritis, Neuropathy, Received Chemotherapy Photos PARESH, TUTWILER ([id number]) [phone].pdf Page 9 of 14 Wound Measurements Length: (cm) 8 Width: (cm) 4 Depth: (cm) 0.2 Area: (cm) 25.133 Volume: (cm) 5.027 % Reduction in Area: -2568% % Reduction in Volume: -2573.9% Epithelialization: None Wound Description Classification: Category/Stage III Exudate Amount: Medium Exudate Type: Serosanguineous Exudate Color: red, brown Slough/Fibrino Yes Wound Bed Granulation Amount: Small (1-33%) Exposed Structure Granulation Quality: Red, Pink Fascia Exposed: No Necrotic Amount: Large (67-100%) Fat Layer (Subcutaneous Tissue) Exposed: Yes Necrotic Quality: Eschar, Adherent Slough Tendon Exposed: No Muscle Exposed: No Joint Exposed: No Bone Exposed: No Treatment Notes Wound #16 (Calcaneus) Wound Laterality: Right Cleanser Soap and Water Discharge Instruction: Gently cleanse wound with antibacterial soap, rinse and pat dry prior to dressing wounds Wound Cleanser Discharge Instruction: Wash your hands with soap and water. Remove old dressing, discard into plastic bag and place into trash. Cleanse the wound with Wound Cleanser prior to applying a clean dressing using gauze sponges, not tissues or cotton balls. Do not scrub or use excessive force. Pat dry using gauze sponges, not tissue or cotton balls. Peri-Wound Care Topical Primary Dressing Silvercel Small 2x2 (in/in) Discharge Instruction: Apply Silvercel Small 2x2 (in/in) as instructed Secondary Dressing ABD Pad 5x9 (in/in) Discharge Instruction: Cover with ABD  pad Secured With Kerlix Roll Sterile or Non-Sterile 6-ply 4.5x4 (yd/yd) Discharge Instruction: Apply Kerlix as directed Compression Wrap Compression Stockings Add-Ons Electronic Signature(s) Signed: 04/10/2023 12:41:22 PM By: Midge Aver MSN RN CNS WTA Entered By: Midge Aver on 04/09/2023 11:29:10 Steuart,  ALLIN, BITHELL ([id number]) [id number] Page 1 of 14 Visit Report for 04/09/2023 Arrival Information Details Patient Name: Date of Service: DETRAVIOUS, KIRCHEN 04/09/2023 10:15 A M Medical Record Number: [id number] Patient Account Number: [id number] Date of Birth/Sex: Treating RN: 1961-05-09 (62 y.o. [name] Primary Care Jin Capote: [name] Other Clinician: Referring Adira Limburg: Treating Saveon Plant/Extender: [name] in Treatment: 82 Visit Information History Since Last Visit Added or deleted any medications: No Patient Arrived: Wheel Chair Any new allergies or adverse reactions: No Arrival Time: 11:00 Has Dressing in Place as Prescribed: Yes Accompanied By: self Pain Present Now: No Transfer Assistance: None Patient Identification Verified: Yes Secondary Verification Process Completed: Yes Patient Requires Transmission-Based No Precautions: Patient Has Alerts: Yes Patient Alerts: Patient on Blood Thinner NOT diabetic aspirin 81mg  Lives [place] [facility] ABI R 0.81 11/05/22 ABI L 0.59 11/05/22 [facility] 03/03/23 Electronic Signature(s) Signed: 04/09/2023 2:43:37 PM By: Midge Aver MSN RN CNS WTA Entered By: Midge Aver on 04/09/2023 14:43:37 -------------------------------------------------------------------------------- Clinic Level of Care Assessment Details Patient Name: Date of Service: KEJON, ORSBORN 04/09/2023 10:15 A M Medical Record Number: [id number] Patient Account Number: [id number] Date of Birth/Sex: Treating RN: [birth year] (62 y.o. [name] Primary Care Yug Loria: [name] Other Clinician: Referring Reese Stockman: Treating Quincy Prisco/Extender: [name] Weeks in Treatment: 69 Clinic Level of Care Assessment Items TOOL 4 Quantity Score X- 1 0 Use when only an EandM is performed on FOLLOW-UP visit ASSESSMENTS - Nursing Assessment / Reassessment NIVEN, CAPPUCCIO ([id number])  [id number] Page 2 of 14 X- 1 10 Reassessment of Co-morbidities (includes updates in patient status) X- 1 5 Reassessment of Adherence to Treatment Plan ASSESSMENTS - Wound and Skin A ssessment / Reassessment []  - 0 Simple Wound Assessment / Reassessment - one wound X- 4 5 Complex Wound Assessment / Reassessment - multiple wounds []  - 0 Dermatologic / Skin Assessment (not related to wound area) ASSESSMENTS - Focused Assessment []  - 0 Circumferential Edema Measurements - multi extremities []  - 0 Nutritional Assessment / Counseling / Intervention []  - 0 Lower Extremity Assessment (monofilament, tuning fork, pulses) []  - 0 Peripheral Arterial Disease Assessment (using hand held doppler) ASSESSMENTS - Ostomy and/or Continence Assessment and Care []  - 0 Incontinence Assessment and Management []  - 0 Ostomy Care Assessment and Management (repouching, etc.) PROCESS - Coordination of Care []  - 0 Simple Patient / Family Education for ongoing care X- 1 20 Complex (extensive) Patient / Family Education for ongoing care X- 1 10 Staff obtains Chiropractor, Records, T Results / Process Orders est []  - 0 Staff telephones HHA, Nursing Homes / Clarify orders / etc []  - 0 Routine Transfer to another Facility (non-emergent condition) []  - 0 Routine Hospital Admission (non-emergent condition) []  - 0 New Admissions / Manufacturing engineer / Ordering NPWT Apligraf, etc. , []  - 0 Emergency Hospital Admission (emergent condition) []  - 0 Simple Discharge Coordination X- 1 15 Complex (extensive) Discharge Coordination PROCESS - Special Needs []  - 0 Pediatric / Minor Patient Management []  - 0 Isolation Patient Management []  - 0 Hearing / Language / Visual special needs []  - 0 Assessment of Community assistance (transportation, D/C planning, etc.) []  - 0 Additional assistance / Altered mentation []  - 0 Support Surface(s) Assessment (bed, cushion, seat,  etc.) INTERVENTIONS - Wound Cleansing / Measurement []  - 0 Simple Wound Cleansing - one wound X- 4 5 Complex Wound Cleansing - multiple wounds []  - 0 Wound Imaging (photographs - any number of wounds) []  - 0 Wound

## 2023-04-16 ENCOUNTER — Encounter: Payer: Medicaid Other | Admitting: Physician Assistant

## 2023-04-16 DIAGNOSIS — L89613 Pressure ulcer of right heel, stage 3: Secondary | ICD-10-CM | POA: Diagnosis not present

## 2023-04-16 NOTE — Progress Notes (Addendum)
JUPITER, REICHOW BERT 04/16/2023 1:15 PM Medical Record Number: [id number] Patient Account Number: [id number] Date of Birth/Sex: Treating RN: 11/13/1960 (62 y.o. [name] Primary Care Katoya Amato: [name] Other Clinician: Referring Avigail Pilling: Treating Lacoya Wilbanks/Extender: [name] Weeks in Treatment: 70 Wound Status Wound Number: 18 Primary Pressure Ulcer Etiology: DIAN, BERNA ([id number]) [id number] Page 12 of 16 Etiology: Wound Location: Right, Medial T Second oe Wound Open Wounding Event: Pressure Injury Status: Date Acquired: 09/26/2022 Comorbid Anemia, Chronic Obstructive Pulmonary Disease (COPD), Coronary Weeks Of Treatment: 26 History: Artery Disease, Peripheral Arterial Disease, Peripheral Venous Clustered Wound: No Disease, Hepatitis C, Osteoarthritis, Neuropathy, Received Chemotherapy Photos Wound Measurements Length: (cm) 2 Width: (cm) 0.8 Depth: (cm) 0.1 Area: (cm) 1.257 Volume: (cm) 0.126 % Reduction in Area: -128.5% % Reduction in Volume: -129.1% Epithelialization: None Wound Description Classification: Category/Stage III Exudate Amount: Medium Exudate  Type: Serosanguineous Exudate Color: red, brown Foul Odor After Cleansing: No Slough/Fibrino Yes Wound Bed Granulation Amount: Small (1-33%) Exposed Structure Granulation Quality: Red, Pink Fascia Exposed: No Necrotic Amount: Medium (34-66%) Fat Layer (Subcutaneous Tissue) Exposed: Yes Necrotic Quality: Adherent Slough Tendon Exposed: No Muscle Exposed: No Joint Exposed: No Bone Exposed: No Treatment Notes Wound #18 (Toe Second) Wound Laterality: Right, Medial Cleanser Soap and Water Discharge Instruction: Gently cleanse wound with antibacterial soap, rinse and pat dry prior to dressing wounds Wound Cleanser Discharge Instruction: Wash your hands with soap and water. Remove old dressing, discard into plastic bag and place into trash. Cleanse the wound with Wound Cleanser prior to applying a clean dressing using gauze sponges, not tissues or cotton balls. Do not scrub or use excessive force. Pat dry using gauze sponges, not tissue or cotton balls. Peri-Wound Care Topical Primary Dressing Silvercel Small 2x2 (in/in) Discharge Instruction: Apply Silvercel Small 2x2 (in/in) as instructed Secondary Dressing ABD Pad 5x9 (in/in) Discharge Instruction: Cover with ABD pad Secured With Kerlix Roll Sterile or Non-Sterile 6-ply 4.5x4 (yd/yd) Discharge Instruction: Apply Kerlix as directed Tubigrip Size D, 3x10 (in/yd) Compression Wrap Millward, [name] ([id number]) [id number] Page 13 of 16 Compression Stockings Add-Ons Electronic Signature(s) Signed: 04/16/2023 5:11:11 PM By: Midge Aver MSN RN CNS WTA Entered By: Midge Aver on 04/16/2023 13:57:44 -------------------------------------------------------------------------------- Wound Assessment Details Patient Name: Date of Service: VAREN, HINK 04/16/2023 1:15 PM Medical Record Number: [id number] Patient Account Number: [id number] Date of Birth/Sex: Treating RN: [birth year] (62 y.o. [name] Primary Care Basya Casavant: [name] Other Clinician: Referring Aryah Doering: Treating Saabir Blyth/Extender: [name] Weeks in Treatment: 70 Wound Status Wound Number: 21 Primary Venous Leg Ulcer Etiology: Wound Location: Right, Dorsal Foot Wound Open Wounding Event: Pressure Injury Status: Date Acquired: 03/02/2023 Comorbid Anemia, Chronic Obstructive Pulmonary Disease (COPD), Coronary Weeks Of Treatment: 3 History: Artery Disease, Peripheral Arterial Disease, Peripheral Venous Clustered Wound: Yes Disease, Hepatitis C, Osteoarthritis, Neuropathy, Received Chemotherapy Photos Wound Measurements Length: (cm) 4 Width: (cm) 4.5 Depth: (cm) 0.1 Area: (cm) 14.137 Volume: (cm) 1.414 % Reduction in Area: -700.1% % Reduction in Volume: -698.9% Epithelialization: Medium (34-66%) Wound Description Classification: Full Thickness Without Exposed Support Exudate Amount: Medium Exudate Type: Serous Exudate Color: amber Structures Foul Odor After Cleansing: No Slough/Fibrino Yes Wound Bed Granulation Amount: Small (1-33%) Exposed Structure Granulation Quality: Red, Pink Fascia Exposed: No Necrotic Amount: Small (1-33%) Fat Layer (Subcutaneous Tissue) Exposed: Yes Necrotic Quality: Adherent Slough Tendon Exposed: No Muscle Exposed: No Joint Exposed: No Bogosian, [name] ([id number]) [id number] Page 14 of 16 Bone Exposed: No Treatment Notes Wound #21 (Foot) Wound Laterality: Dorsal, Right Cleanser  JUPITER, REICHOW BERT 04/16/2023 1:15 PM Medical Record Number: [id number] Patient Account Number: [id number] Date of Birth/Sex: Treating RN: 11/13/1960 (62 y.o. [name] Primary Care Katoya Amato: [name] Other Clinician: Referring Avigail Pilling: Treating Lacoya Wilbanks/Extender: [name] Weeks in Treatment: 70 Wound Status Wound Number: 18 Primary Pressure Ulcer Etiology: DIAN, BERNA ([id number]) [id number] Page 12 of 16 Etiology: Wound Location: Right, Medial T Second oe Wound Open Wounding Event: Pressure Injury Status: Date Acquired: 09/26/2022 Comorbid Anemia, Chronic Obstructive Pulmonary Disease (COPD), Coronary Weeks Of Treatment: 26 History: Artery Disease, Peripheral Arterial Disease, Peripheral Venous Clustered Wound: No Disease, Hepatitis C, Osteoarthritis, Neuropathy, Received Chemotherapy Photos Wound Measurements Length: (cm) 2 Width: (cm) 0.8 Depth: (cm) 0.1 Area: (cm) 1.257 Volume: (cm) 0.126 % Reduction in Area: -128.5% % Reduction in Volume: -129.1% Epithelialization: None Wound Description Classification: Category/Stage III Exudate Amount: Medium Exudate  Type: Serosanguineous Exudate Color: red, brown Foul Odor After Cleansing: No Slough/Fibrino Yes Wound Bed Granulation Amount: Small (1-33%) Exposed Structure Granulation Quality: Red, Pink Fascia Exposed: No Necrotic Amount: Medium (34-66%) Fat Layer (Subcutaneous Tissue) Exposed: Yes Necrotic Quality: Adherent Slough Tendon Exposed: No Muscle Exposed: No Joint Exposed: No Bone Exposed: No Treatment Notes Wound #18 (Toe Second) Wound Laterality: Right, Medial Cleanser Soap and Water Discharge Instruction: Gently cleanse wound with antibacterial soap, rinse and pat dry prior to dressing wounds Wound Cleanser Discharge Instruction: Wash your hands with soap and water. Remove old dressing, discard into plastic bag and place into trash. Cleanse the wound with Wound Cleanser prior to applying a clean dressing using gauze sponges, not tissues or cotton balls. Do not scrub or use excessive force. Pat dry using gauze sponges, not tissue or cotton balls. Peri-Wound Care Topical Primary Dressing Silvercel Small 2x2 (in/in) Discharge Instruction: Apply Silvercel Small 2x2 (in/in) as instructed Secondary Dressing ABD Pad 5x9 (in/in) Discharge Instruction: Cover with ABD pad Secured With Kerlix Roll Sterile or Non-Sterile 6-ply 4.5x4 (yd/yd) Discharge Instruction: Apply Kerlix as directed Tubigrip Size D, 3x10 (in/yd) Compression Wrap Millward, [name] ([id number]) [id number] Page 13 of 16 Compression Stockings Add-Ons Electronic Signature(s) Signed: 04/16/2023 5:11:11 PM By: Midge Aver MSN RN CNS WTA Entered By: Midge Aver on 04/16/2023 13:57:44 -------------------------------------------------------------------------------- Wound Assessment Details Patient Name: Date of Service: VAREN, HINK 04/16/2023 1:15 PM Medical Record Number: [id number] Patient Account Number: [id number] Date of Birth/Sex: Treating RN: [birth year] (62 y.o. [name] Primary Care Basya Casavant: [name] Other Clinician: Referring Aryah Doering: Treating Saabir Blyth/Extender: [name] Weeks in Treatment: 70 Wound Status Wound Number: 21 Primary Venous Leg Ulcer Etiology: Wound Location: Right, Dorsal Foot Wound Open Wounding Event: Pressure Injury Status: Date Acquired: 03/02/2023 Comorbid Anemia, Chronic Obstructive Pulmonary Disease (COPD), Coronary Weeks Of Treatment: 3 History: Artery Disease, Peripheral Arterial Disease, Peripheral Venous Clustered Wound: Yes Disease, Hepatitis C, Osteoarthritis, Neuropathy, Received Chemotherapy Photos Wound Measurements Length: (cm) 4 Width: (cm) 4.5 Depth: (cm) 0.1 Area: (cm) 14.137 Volume: (cm) 1.414 % Reduction in Area: -700.1% % Reduction in Volume: -698.9% Epithelialization: Medium (34-66%) Wound Description Classification: Full Thickness Without Exposed Support Exudate Amount: Medium Exudate Type: Serous Exudate Color: amber Structures Foul Odor After Cleansing: No Slough/Fibrino Yes Wound Bed Granulation Amount: Small (1-33%) Exposed Structure Granulation Quality: Red, Pink Fascia Exposed: No Necrotic Amount: Small (1-33%) Fat Layer (Subcutaneous Tissue) Exposed: Yes Necrotic Quality: Adherent Slough Tendon Exposed: No Muscle Exposed: No Joint Exposed: No Bogosian, [name] ([id number]) [id number] Page 14 of 16 Bone Exposed: No Treatment Notes Wound #21 (Foot) Wound Laterality: Dorsal, Right Cleanser  CNS WTA Entered By: Midge Aver on 04/16/2023 16:12:54 [name] ([id number]) [id number] Page 9 of  16 -------------------------------------------------------------------------------- Pain Assessment Details Patient Name: Date of Service: KIEN, COLOMBO 04/16/2023 1:15 PM Medical Record Number: [id number] Patient Account Number: [id number] Date of Birth/Sex: Treating RN: [birth year] (62 y.o. [name] Primary Care Davarious Tumbleson: [name] Other Clinician: Referring Saesha Llerenas: Treating Macarius Ruark/Extender: [name] Weeks in Treatment: 40 Active Problems Location of Pain Severity and Description of Pain Patient Has Paino Yes Site Locations Rate the pain. Current Pain Level: 6 Pain Management and Medication Current Pain Management: Electronic Signature(s) Signed: 04/16/2023 4:07:01 PM By: Midge Aver MSN RN CNS WTA Entered By: Midge Aver on 04/16/2023 16:07:01 -------------------------------------------------------------------------------- Patient/Caregiver Education Details Patient Name: Date of Service: Denna Haggard 9/19/2024andnbsp1:15 PM Medical Record Number: [id number] Patient Account Number: [id number] Date of Birth/Gender: Treating RN: June 23, 1961 (62 y.o. [name] Primary Care Physician: [name] Other Clinician: Referring Physician: Treating Physician/Extender: [name] Weeks in Treatment: [address], [place] ([id number]) [id number] Page 10 of 16 Education Assessment Education Provided To: Patient Education Topics Provided Wound/Skin Impairment: Handouts: Caring for Your Ulcer Methods: Explain/Verbal Responses: State content correctly Electronic Signature(s) Signed: 04/16/2023 5:11:11 PM By: Midge Aver MSN RN CNS WTA Entered By: Midge Aver on 04/16/2023 16:13:06 -------------------------------------------------------------------------------- Wound Assessment Details Patient Name: Date of Service: JOHNPETER, GLEISSNER 04/16/2023 1:15 PM Medical Record Number: [id number] Patient  Account Number: [id number] Date of Birth/Sex: Treating RN: 11-Jun-1961 (62 y.o. [name] Primary Care Xandrea Clarey: [name] Other Clinician: Referring Tiaunna Buford: Treating Numa Schroeter/Extender: [name] Weeks in Treatment: 70 Wound Status Wound Number: 16 Primary Pressure Ulcer Etiology: Wound Location: Right Calcaneus Wound Open Wounding Event: Gradually Appeared Status: Date Acquired: 09/26/2022 Comorbid Anemia, Chronic Obstructive Pulmonary Disease (COPD), Coronary Weeks Of Treatment: 27 History: Artery Disease, Peripheral Arterial Disease, Peripheral Venous Clustered Wound: Yes Disease, Hepatitis C, Osteoarthritis, Neuropathy, Received Chemotherapy Photos Wound Measurements Length: (cm) 8.3 Width: (cm) 4 Depth: (cm) 0.2 Area: (cm) 26.075 Volume: (cm) 5.215 % Reduction in Area: -2668% % Reduction in Volume: -2673.9% Epithelialization: None Wound Description Classification: Category/Stage III Exudate Amount: Medium Havel, Macen ([id number]) Exudate Type: Serosanguineous Exudate Color: red, brown Slough/Fibrino Yes [id number] Page 11 of 16 Wound Bed Granulation Amount: Small (1-33%) Exposed Structure Granulation Quality: Red, Pink Fascia Exposed: No Necrotic Amount: Large (67-100%) Fat Layer (Subcutaneous Tissue) Exposed: Yes Necrotic Quality: Eschar, Adherent Slough Tendon Exposed: No Muscle Exposed: No Joint Exposed: No Bone Exposed: No Treatment Notes Wound #16 (Calcaneus) Wound Laterality: Right Cleanser Soap and Water Discharge Instruction: Gently cleanse wound with antibacterial soap, rinse and pat dry prior to dressing wounds Wound Cleanser Discharge Instruction: Wash your hands with soap and water. Remove old dressing, discard into plastic bag and place into trash. Cleanse the wound with Wound Cleanser prior to applying a clean dressing using gauze sponges, not tissues or cotton balls. Do not scrub or use  excessive force. Pat dry using gauze sponges, not tissue or cotton balls. Peri-Wound Care Topical Primary Dressing Silvercel Small 2x2 (in/in) Discharge Instruction: Apply Silvercel Small 2x2 (in/in) as instructed Secondary Dressing ABD Pad 5x9 (in/in) Discharge Instruction: Cover with ABD pad Secured With Kerlix Roll Sterile or Non-Sterile 6-ply 4.5x4 (yd/yd) Discharge Instruction: Apply Kerlix as directed Tubigrip Size D, 3x10 (in/yd) Compression Wrap Compression Stockings Add-Ons Electronic Signature(s) Signed: 04/16/2023 5:11:11 PM By: Midge Aver MSN RN CNS WTA Entered By: Midge Aver on 04/16/2023 13:57:14 -------------------------------------------------------------------------------- Wound Assessment Details Patient Name: Date of Service:  JONH, POFFENBERGER ([id number]) [id number] Page 1 of 16 Visit Report for 04/16/2023 Arrival Information Details Patient Name: Date of Service: JAYQUAN, GOTSHALL 04/16/2023 1:15 PM Medical Record Number: [id number] Patient Account Number: [id number] Date of Birth/Sex: Treating RN: January 05, 1961 (62 y.o. [name] Primary Care Rohen Kimes: [name] Other Clinician: Referring Madiline Saffran: Treating Sharesa Kemp/Extender: [name] in Treatment: 77 Visit Information History Since Last Visit Added or deleted any medications: No Patient Arrived: Wheel Chair Any new allergies or adverse reactions: No Arrival Time: 13:29 Has Dressing in Place as Prescribed: Yes Accompanied By: self Pain Present Now: No Transfer Assistance: Transfer Board Patient Identification Verified: Yes Secondary Verification Process Completed: Yes Patient Requires Transmission-Based No Precautions: Patient Has Alerts: Yes Patient Alerts: Patient on Blood Thinner NOT diabetic aspirin 81mg  Lives [facility] ABI R 0.81 11/05/22 ABI L 0.59 11/05/22 [facility] 03/03/23 Electronic Signature(s) Signed: 04/16/2023 4:06:49 PM By: Midge Aver MSN RN CNS WTA Entered By: Midge Aver on 04/16/2023 16:06:49 -------------------------------------------------------------------------------- Clinic Level of Care Assessment Details Patient Name: Date of Service: HODGE, SHIFFLER 04/16/2023 1:15 PM Medical Record Number: [id number] Patient Account Number: [id number] Date of Birth/Sex: Treating RN: 05/08/1961 (62 y.o. [name] Primary Care Nael Petrosyan: [name] Other Clinician: Referring Tjuana Vickrey: Treating Aaliah Jorgenson/Extender: [name] Weeks in Treatment: 34 Clinic Level of Care Assessment Items TOOL 4 Quantity Score X- 1 0 Use when only an EandM is performed on FOLLOW-UP visit ASSESSMENTS - Nursing Assessment / Reassessment LEANDRA, CHISUM ([id number])  [id number] Page 2 of 16 X- 1 10 Reassessment of Co-morbidities (includes updates in patient status) X- 1 5 Reassessment of Adherence to Treatment Plan ASSESSMENTS - Wound and Skin A ssessment / Reassessment []  - 0 Simple Wound Assessment / Reassessment - one wound X- 4 5 Complex Wound Assessment / Reassessment - multiple wounds []  - 0 Dermatologic / Skin Assessment (not related to wound area) ASSESSMENTS - Focused Assessment []  - 0 Circumferential Edema Measurements - multi extremities []  - 0 Nutritional Assessment / Counseling / Intervention []  - 0 Lower Extremity Assessment (monofilament, tuning fork, pulses) []  - 0 Peripheral Arterial Disease Assessment (using hand held doppler) ASSESSMENTS - Ostomy and/or Continence Assessment and Care []  - 0 Incontinence Assessment and Management []  - 0 Ostomy Care Assessment and Management (repouching, etc.) PROCESS - Coordination of Care []  - 0 Simple Patient / Family Education for ongoing care X- 1 20 Complex (extensive) Patient / Family Education for ongoing care X- 1 10 Staff obtains Chiropractor, Records, T Results / Process Orders est []  - 0 Staff telephones HHA, Nursing Homes / Clarify orders / etc []  - 0 Routine Transfer to another Facility (non-emergent condition) []  - 0 Routine Hospital Admission (non-emergent condition) []  - 0 New Admissions / Manufacturing engineer / Ordering NPWT Apligraf, etc. , []  - 0 Emergency Hospital Admission (emergent condition) []  - 0 Simple Discharge Coordination X- 1 15 Complex (extensive) Discharge Coordination PROCESS - Special Needs []  - 0 Pediatric / Minor Patient Management []  - 0 Isolation Patient Management []  - 0 Hearing / Language / Visual special needs []  - 0 Assessment of Community assistance (transportation, D/C planning, etc.) []  - 0 Additional assistance / Altered mentation []  - 0 Support Surface(s) Assessment (bed, cushion, seat,  etc.) INTERVENTIONS - Wound Cleansing / Measurement []  - 0 Simple Wound Cleansing - one wound X- 4 5 Complex Wound Cleansing - multiple wounds X- 1 5 Wound Imaging (photographs - any number of wounds) []  - 0 Wound Tracing (  JONH, POFFENBERGER ([id number]) [id number] Page 1 of 16 Visit Report for 04/16/2023 Arrival Information Details Patient Name: Date of Service: JAYQUAN, GOTSHALL 04/16/2023 1:15 PM Medical Record Number: [id number] Patient Account Number: [id number] Date of Birth/Sex: Treating RN: January 05, 1961 (62 y.o. [name] Primary Care Rohen Kimes: [name] Other Clinician: Referring Madiline Saffran: Treating Sharesa Kemp/Extender: [name] in Treatment: 77 Visit Information History Since Last Visit Added or deleted any medications: No Patient Arrived: Wheel Chair Any new allergies or adverse reactions: No Arrival Time: 13:29 Has Dressing in Place as Prescribed: Yes Accompanied By: self Pain Present Now: No Transfer Assistance: Transfer Board Patient Identification Verified: Yes Secondary Verification Process Completed: Yes Patient Requires Transmission-Based No Precautions: Patient Has Alerts: Yes Patient Alerts: Patient on Blood Thinner NOT diabetic aspirin 81mg  Lives [place] [facility] ABI R 0.81 11/05/22 ABI L 0.59 11/05/22 [facility] 03/03/23 Electronic Signature(s) Signed: 04/16/2023 4:06:49 PM By: Midge Aver MSN RN CNS WTA Entered By: Midge Aver on 04/16/2023 16:06:49 -------------------------------------------------------------------------------- Clinic Level of Care Assessment Details Patient Name: Date of Service: HODGE, SHIFFLER 04/16/2023 1:15 PM Medical Record Number: [id number] Patient Account Number: [id number] Date of Birth/Sex: Treating RN: 05/08/1961 (62 y.o. [name] Primary Care Nael Petrosyan: [name] Other Clinician: Referring Tjuana Vickrey: Treating Aaliah Jorgenson/Extender: [name] Weeks in Treatment: 34 Clinic Level of Care Assessment Items TOOL 4 Quantity Score X- 1 0 Use when only an EandM is performed on FOLLOW-UP visit ASSESSMENTS - Nursing Assessment / Reassessment LEANDRA, CHISUM ([id number])  [id number] Page 2 of 16 X- 1 10 Reassessment of Co-morbidities (includes updates in patient status) X- 1 5 Reassessment of Adherence to Treatment Plan ASSESSMENTS - Wound and Skin A ssessment / Reassessment []  - 0 Simple Wound Assessment / Reassessment - one wound X- 4 5 Complex Wound Assessment / Reassessment - multiple wounds []  - 0 Dermatologic / Skin Assessment (not related to wound area) ASSESSMENTS - Focused Assessment []  - 0 Circumferential Edema Measurements - multi extremities []  - 0 Nutritional Assessment / Counseling / Intervention []  - 0 Lower Extremity Assessment (monofilament, tuning fork, pulses) []  - 0 Peripheral Arterial Disease Assessment (using hand held doppler) ASSESSMENTS - Ostomy and/or Continence Assessment and Care []  - 0 Incontinence Assessment and Management []  - 0 Ostomy Care Assessment and Management (repouching, etc.) PROCESS - Coordination of Care []  - 0 Simple Patient / Family Education for ongoing care X- 1 20 Complex (extensive) Patient / Family Education for ongoing care X- 1 10 Staff obtains Chiropractor, Records, T Results / Process Orders est []  - 0 Staff telephones HHA, Nursing Homes / Clarify orders / etc []  - 0 Routine Transfer to another Facility (non-emergent condition) []  - 0 Routine Hospital Admission (non-emergent condition) []  - 0 New Admissions / Manufacturing engineer / Ordering NPWT Apligraf, etc. , []  - 0 Emergency Hospital Admission (emergent condition) []  - 0 Simple Discharge Coordination X- 1 15 Complex (extensive) Discharge Coordination PROCESS - Special Needs []  - 0 Pediatric / Minor Patient Management []  - 0 Isolation Patient Management []  - 0 Hearing / Language / Visual special needs []  - 0 Assessment of Community assistance (transportation, D/C planning, etc.) []  - 0 Additional assistance / Altered mentation []  - 0 Support Surface(s) Assessment (bed, cushion, seat,  etc.) INTERVENTIONS - Wound Cleansing / Measurement []  - 0 Simple Wound Cleansing - one wound X- 4 5 Complex Wound Cleansing - multiple wounds X- 1 5 Wound Imaging (photographs - any number of wounds) []  - 0 Wound Tracing (  JONH, POFFENBERGER ([id number]) [id number] Page 1 of 16 Visit Report for 04/16/2023 Arrival Information Details Patient Name: Date of Service: JAYQUAN, GOTSHALL 04/16/2023 1:15 PM Medical Record Number: [id number] Patient Account Number: [id number] Date of Birth/Sex: Treating RN: January 05, 1961 (62 y.o. [name] Primary Care Rohen Kimes: [name] Other Clinician: Referring Madiline Saffran: Treating Sharesa Kemp/Extender: [name] in Treatment: 77 Visit Information History Since Last Visit Added or deleted any medications: No Patient Arrived: Wheel Chair Any new allergies or adverse reactions: No Arrival Time: 13:29 Has Dressing in Place as Prescribed: Yes Accompanied By: self Pain Present Now: No Transfer Assistance: Transfer Board Patient Identification Verified: Yes Secondary Verification Process Completed: Yes Patient Requires Transmission-Based No Precautions: Patient Has Alerts: Yes Patient Alerts: Patient on Blood Thinner NOT diabetic aspirin 81mg  Lives [place] [facility] ABI R 0.81 11/05/22 ABI L 0.59 11/05/22 [facility] 03/03/23 Electronic Signature(s) Signed: 04/16/2023 4:06:49 PM By: Midge Aver MSN RN CNS WTA Entered By: Midge Aver on 04/16/2023 16:06:49 -------------------------------------------------------------------------------- Clinic Level of Care Assessment Details Patient Name: Date of Service: HODGE, SHIFFLER 04/16/2023 1:15 PM Medical Record Number: [id number] Patient Account Number: [id number] Date of Birth/Sex: Treating RN: 05/08/1961 (62 y.o. [name] Primary Care Nael Petrosyan: [name] Other Clinician: Referring Tjuana Vickrey: Treating Aaliah Jorgenson/Extender: [name] Weeks in Treatment: 34 Clinic Level of Care Assessment Items TOOL 4 Quantity Score X- 1 0 Use when only an EandM is performed on FOLLOW-UP visit ASSESSMENTS - Nursing Assessment / Reassessment LEANDRA, CHISUM ([id number])  [id number].pdf Page 2 of 16 X- 1 10 Reassessment of Co-morbidities (includes updates in patient status) X- 1 5 Reassessment of Adherence to Treatment Plan ASSESSMENTS - Wound and Skin A ssessment / Reassessment []  - 0 Simple Wound Assessment / Reassessment - one wound X- 4 5 Complex Wound Assessment / Reassessment - multiple wounds []  - 0 Dermatologic / Skin Assessment (not related to wound area) ASSESSMENTS - Focused Assessment []  - 0 Circumferential Edema Measurements - multi extremities []  - 0 Nutritional Assessment / Counseling / Intervention []  - 0 Lower Extremity Assessment (monofilament, tuning fork, pulses) []  - 0 Peripheral Arterial Disease Assessment (using hand held doppler) ASSESSMENTS - Ostomy and/or Continence Assessment and Care []  - 0 Incontinence Assessment and Management []  - 0 Ostomy Care Assessment and Management (repouching, etc.) PROCESS - Coordination of Care []  - 0 Simple Patient / Family Education for ongoing care X- 1 20 Complex (extensive) Patient / Family Education for ongoing care X- 1 10 Staff obtains Chiropractor, Records, T Results / Process Orders est []  - 0 Staff telephones HHA, Nursing Homes / Clarify orders / etc []  - 0 Routine Transfer to another Facility (non-emergent condition) []  - 0 Routine Hospital Admission (non-emergent condition) []  - 0 New Admissions / Manufacturing engineer / Ordering NPWT Apligraf, etc. , []  - 0 Emergency Hospital Admission (emergent condition) []  - 0 Simple Discharge Coordination X- 1 15 Complex (extensive) Discharge Coordination PROCESS - Special Needs []  - 0 Pediatric / Minor Patient Management []  - 0 Isolation Patient Management []  - 0 Hearing / Language / Visual special needs []  - 0 Assessment of Community assistance (transportation, D/C planning, etc.) []  - 0 Additional assistance / Altered mentation []  - 0 Support Surface(s) Assessment (bed, cushion, seat,  etc.) INTERVENTIONS - Wound Cleansing / Measurement []  - 0 Simple Wound Cleansing - one wound X- 4 5 Complex Wound Cleansing - multiple wounds X- 1 5 Wound Imaging (photographs - any number of wounds) []  - 0 Wound Tracing (  CNS WTA Entered By: Midge Aver on 04/16/2023 16:12:54 [name] ([id number]) [id number] Page 9 of  16 -------------------------------------------------------------------------------- Pain Assessment Details Patient Name: Date of Service: KIEN, [place] 04/16/2023 1:15 PM Medical Record Number: [id number] Patient Account Number: [id number] Date of Birth/Sex: Treating RN: [birth year] (62 y.o. [name] Primary Care Davarious Tumbleson: [name] Other Clinician: Referring Saesha Llerenas: Treating Macarius Ruark/Extender: [name] Weeks in Treatment: 40 Active Problems Location of Pain Severity and Description of Pain Patient Has Paino Yes Site Locations Rate the pain. Current Pain Level: 6 Pain Management and Medication Current Pain Management: Electronic Signature(s) Signed: 04/16/2023 4:07:01 PM By: Midge Aver MSN RN CNS WTA Entered By: Midge Aver on 04/16/2023 16:07:01 -------------------------------------------------------------------------------- Patient/Caregiver Education Details Patient Name: Date of Service: Denna Haggard 9/19/2024andnbsp1:15 PM Medical Record Number: [id number] Patient Account Number: [id number] Date of Birth/Gender: Treating RN: June 23, 1961 (62 y.o. [name] Primary Care Physician: [name] Other Clinician: Referring Physician: Treating Physician/Extender: [name] Weeks in Treatment: [address], [place] ([id number]) [id number] Page 10 of 16 Education Assessment Education Provided To: Patient Education Topics Provided Wound/Skin Impairment: Handouts: Caring for Your Ulcer Methods: Explain/Verbal Responses: State content correctly Electronic Signature(s) Signed: 04/16/2023 5:11:11 PM By: Midge Aver MSN RN CNS WTA Entered By: Midge Aver on 04/16/2023 16:13:06 -------------------------------------------------------------------------------- Wound Assessment Details Patient Name: Date of Service: JOHNPETER, GLEISSNER 04/16/2023 1:15 PM Medical Record Number: [id number] Patient  Account Number: [id number] Date of Birth/Sex: Treating RN: 11-Jun-1961 (62 y.o. [name] Primary Care Xandrea Clarey: [name] Other Clinician: Referring Tiaunna Buford: Treating Numa Schroeter/Extender: [name] Weeks in Treatment: 70 Wound Status Wound Number: 16 Primary Pressure Ulcer Etiology: Wound Location: Right Calcaneus Wound Open Wounding Event: Gradually Appeared Status: Date Acquired: 09/26/2022 Comorbid Anemia, Chronic Obstructive Pulmonary Disease (COPD), Coronary Weeks Of Treatment: 27 History: Artery Disease, Peripheral Arterial Disease, Peripheral Venous Clustered Wound: Yes Disease, Hepatitis C, Osteoarthritis, Neuropathy, Received Chemotherapy Photos Wound Measurements Length: (cm) 8.3 Width: (cm) 4 Depth: (cm) 0.2 Area: (cm) 26.075 Volume: (cm) 5.215 % Reduction in Area: -2668% % Reduction in Volume: -2673.9% Epithelialization: None Wound Description Classification: Category/Stage III Exudate Amount: Medium Havel, Macen ([id number]) Exudate Type: Serosanguineous Exudate Color: red, brown Slough/Fibrino Yes [id number] Page 11 of 16 Wound Bed Granulation Amount: Small (1-33%) Exposed Structure Granulation Quality: Red, Pink Fascia Exposed: No Necrotic Amount: Large (67-100%) Fat Layer (Subcutaneous Tissue) Exposed: Yes Necrotic Quality: Eschar, Adherent Slough Tendon Exposed: No Muscle Exposed: No Joint Exposed: No Bone Exposed: No Treatment Notes Wound #16 (Calcaneus) Wound Laterality: Right Cleanser Soap and Water Discharge Instruction: Gently cleanse wound with antibacterial soap, rinse and pat dry prior to dressing wounds Wound Cleanser Discharge Instruction: Wash your hands with soap and water. Remove old dressing, discard into plastic bag and place into trash. Cleanse the wound with Wound Cleanser prior to applying a clean dressing using gauze sponges, not tissues or cotton balls. Do not scrub or use  excessive force. Pat dry using gauze sponges, not tissue or cotton balls. Peri-Wound Care Topical Primary Dressing Silvercel Small 2x2 (in/in) Discharge Instruction: Apply Silvercel Small 2x2 (in/in) as instructed Secondary Dressing ABD Pad 5x9 (in/in) Discharge Instruction: Cover with ABD pad Secured With Kerlix Roll Sterile or Non-Sterile 6-ply 4.5x4 (yd/yd) Discharge Instruction: Apply Kerlix as directed Tubigrip Size D, 3x10 (in/yd) Compression Wrap Compression Stockings Add-Ons Electronic Signature(s) Signed: 04/16/2023 5:11:11 PM By: Midge Aver MSN RN CNS WTA Entered By: Midge Aver on 04/16/2023 13:57:14 -------------------------------------------------------------------------------- Wound Assessment Details Patient Name: Date of Service:  CNS WTA Entered By: Midge Aver on 04/16/2023 16:12:54 [name] ([id number]) [id number] Page 9 of  16 -------------------------------------------------------------------------------- Pain Assessment Details Patient Name: Date of Service: KIEN, [place] 04/16/2023 1:15 PM Medical Record Number: [id number] Patient Account Number: [id number] Date of Birth/Sex: Treating RN: [birth year] (62 y.o. [name] Primary Care Davarious Tumbleson: [name] Other Clinician: Referring Saesha Llerenas: Treating Macarius Ruark/Extender: [name] Weeks in Treatment: 40 Active Problems Location of Pain Severity and Description of Pain Patient Has Paino Yes Site Locations Rate the pain. Current Pain Level: 6 Pain Management and Medication Current Pain Management: Electronic Signature(s) Signed: 04/16/2023 4:07:01 PM By: Midge Aver MSN RN CNS WTA Entered By: Midge Aver on 04/16/2023 16:07:01 -------------------------------------------------------------------------------- Patient/Caregiver Education Details Patient Name: Date of Service: Denna Haggard 9/19/2024andnbsp1:15 PM Medical Record Number: [id number] Patient Account Number: [id number] Date of Birth/Gender: Treating RN: June 23, 1961 (62 y.o. [name] Primary Care Physician: [name] Other Clinician: Referring Physician: Treating Physician/Extender: [name] Weeks in Treatment: [address], [place] ([id number]) [id number] Page 10 of 16 Education Assessment Education Provided To: Patient Education Topics Provided Wound/Skin Impairment: Handouts: Caring for Your Ulcer Methods: Explain/Verbal Responses: State content correctly Electronic Signature(s) Signed: 04/16/2023 5:11:11 PM By: Midge Aver MSN RN CNS WTA Entered By: Midge Aver on 04/16/2023 16:13:06 -------------------------------------------------------------------------------- Wound Assessment Details Patient Name: Date of Service: JOHNPETER, GLEISSNER 04/16/2023 1:15 PM Medical Record Number: [id number] Patient  Account Number: [id number] Date of Birth/Sex: Treating RN: 11-Jun-1961 (62 y.o. [name] Primary Care Xandrea Clarey: [name] Other Clinician: Referring Tiaunna Buford: Treating Numa Schroeter/Extender: [name] Weeks in Treatment: 70 Wound Status Wound Number: 16 Primary Pressure Ulcer Etiology: Wound Location: Right Calcaneus Wound Open Wounding Event: Gradually Appeared Status: Date Acquired: 09/26/2022 Comorbid Anemia, Chronic Obstructive Pulmonary Disease (COPD), Coronary Weeks Of Treatment: 27 History: Artery Disease, Peripheral Arterial Disease, Peripheral Venous Clustered Wound: Yes Disease, Hepatitis C, Osteoarthritis, Neuropathy, Received Chemotherapy Photos Wound Measurements Length: (cm) 8.3 Width: (cm) 4 Depth: (cm) 0.2 Area: (cm) 26.075 Volume: (cm) 5.215 % Reduction in Area: -2668% % Reduction in Volume: -2673.9% Epithelialization: None Wound Description Classification: Category/Stage III Exudate Amount: Medium Havel, Macen ([id number]) Exudate Type: Serosanguineous Exudate Color: red, brown Slough/Fibrino Yes [id number] Page 11 of 16 Wound Bed Granulation Amount: Small (1-33%) Exposed Structure Granulation Quality: Red, Pink Fascia Exposed: No Necrotic Amount: Large (67-100%) Fat Layer (Subcutaneous Tissue) Exposed: Yes Necrotic Quality: Eschar, Adherent Slough Tendon Exposed: No Muscle Exposed: No Joint Exposed: No Bone Exposed: No Treatment Notes Wound #16 (Calcaneus) Wound Laterality: Right Cleanser Soap and Water Discharge Instruction: Gently cleanse wound with antibacterial soap, rinse and pat dry prior to dressing wounds Wound Cleanser Discharge Instruction: Wash your hands with soap and water. Remove old dressing, discard into plastic bag and place into trash. Cleanse the wound with Wound Cleanser prior to applying a clean dressing using gauze sponges, not tissues or cotton balls. Do not scrub or use  excessive force. Pat dry using gauze sponges, not tissue or cotton balls. Peri-Wound Care Topical Primary Dressing Silvercel Small 2x2 (in/in) Discharge Instruction: Apply Silvercel Small 2x2 (in/in) as instructed Secondary Dressing ABD Pad 5x9 (in/in) Discharge Instruction: Cover with ABD pad Secured With Kerlix Roll Sterile or Non-Sterile 6-ply 4.5x4 (yd/yd) Discharge Instruction: Apply Kerlix as directed Tubigrip Size D, 3x10 (in/yd) Compression Wrap Compression Stockings Add-Ons Electronic Signature(s) Signed: 04/16/2023 5:11:11 PM By: Midge Aver MSN RN CNS WTA Entered By: Midge Aver on 04/16/2023 13:57:14 -------------------------------------------------------------------------------- Wound Assessment Details Patient Name: Date of Service:

## 2023-04-16 NOTE — Progress Notes (Addendum)
Day/30 Days Discharge Instructions: Apply Kerlix as directed Secured With: Tubigrip Size D, 3x10 (in/yd) 1 x Per Day/30 Days Wound #22 - Lower Leg Wound Laterality: Right, Anterior Cleanser: Soap and Water 1 x Per Day/30 Days Discharge Instructions: Gently cleanse wound with antibacterial soap, rinse and pat dry prior to dressing wounds Cleanser: Wound Cleanser 1 x Per Day/30 Days Discharge Instructions: Wash your hands with soap and water. Remove old dressing, discard into plastic bag and place into trash. Cleanse the wound with  Wound Cleanser prior to applying a clean dressing using gauze sponges, not tissues or cotton balls. Do not scrub or use excessive force. Pat dry using gauze sponges, not tissue or cotton balls. Prim Dressing: Silvercel Small 2x2 (in/in) 1 x Per Day/30 Days ary Discharge Instructions: Apply Silvercel Small 2x2 (in/in) as instructed Secondary Dressing: ABD Pad 5x9 (in/in) 1 x Per Day/30 Days Discharge Instructions: Cover with ABD pad Secured With: Kerlix Roll Sterile or Non-Sterile 6-ply 4.5x4 (yd/yd) 1 x Per Day/30 Days Discharge Instructions: Apply Kerlix as directed Secured With: Tubigrip Size D, 3x10 (in/yd) 1 x Per Day/30 Days Laboratory Bacteria identified in Wound by Culture (MICRO) LOINC Code: [phone] Convenience Name: Wound culture routine Patient Medications llergies: penicillin A Notifications Medication Indication Start End 04/16/2023 levofloxacin DOSE 1 - oral 500 mg tablet - 1 tablet oral once daily x 14 days. Do not take calcium or multivitamin with this medication Electronic Signature(s) Signed: 04/16/2023 5:11:11 PM By: Midge Aver MSN RN CNS WTA Signed: 04/17/2023 9:00:01 AM By: [name] PA-C Previous Signature: 04/16/2023 2:22:31 PM Version By: [name] PA-C Entered By: Midge Aver on 04/16/2023 13:10:52 Prescription 04/16/2023 -------------------------------------------------------------------------------- Chad Heaps PA-C Patient Name: Provider: 05-25-1961 [id number] Date of Birth: NPI#Chad [name] Sex: DEA #: [id number] Phone #: License #: UPN: Patient Address: [address] [facility] [place], [place] [address] [place] [address] [place], [place] [address] Chad [name] ([id number]) [id number] Page 9 of 17 [phone] Allergies penicillin Medication Medication: Route: Strength: Form: levofloxacin 500 mg tablet  oral 500 mg tablet Class: QUINOLONE ANTIBIOTICS Dose: Frequency / Time: Indication: 1 1 tablet oral once daily x 14 days. Do not take calcium or multivitamin with this medication Number of Refills: Number of Units: 0 Fourteen (14) Generic Substitution: Start Date: End Date: One Time Use: Substitution Permitted 04/16/2023 No Note to Pharmacy: Hand Signature: Date(s): Electronic Signature(s) Signed: 04/16/2023 5:11:11 PM By: Midge Aver MSN RN CNS WTA Signed: 04/17/2023 9:00:01 AM By: [name] PA-C Previous Signature: 04/16/2023 2:26:46 PM Version By: [name] PA-C Entered By: Midge Aver on 04/16/2023 13:10:53 -------------------------------------------------------------------------------- Problem List Details Patient Name: Date of Service: Chad [name], Chad [name] 04/16/2023 1:15 PM Medical Record Number: [id number] Patient Account Number: [id number] Date of Birth/Sex: Treating RN: [birth year] (62 y.o. 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 If anything worsens or changes patient will contact our office for additional recommendations. Electronic Signature(s) Signed: 04/16/2023 2:23:45 PM By: [name] PA-C Previous Signature: 04/16/2023 2:19:17 PM Version By: [name] PA-C Entered By: [name] on 04/16/2023 11:23:44 -------------------------------------------------------------------------------- SuperBill Details Patient Name: Date of Service: Chad [name], Chad [name] 04/16/2023 Medical Record Number: [id number] Patient Account Number: [id number] Date of Birth/Sex: Treating RN: July 18, 1961 (62 y.o. 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, stage 3 Facility Procedures : CPT4 Code: [id number] Description: 01601 - WOUND CARE VISIT-LEV 5 EST PT Modifier: Quantity: 1 Physician Procedures : CPT4 Code Description Modifier  [id number] 99214 - WC PHYS LEVEL 4 - EST PT ICD-10 Diagnosis Description 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 (idiopathic) with ulcer and inflammation of left lower extremity L98.492 Non-pressure chronic ulcer of skin of  other sites with fat layer exposed 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 Quantity: 1 Electronic Signature(s) Signed: 04/16/2023 4:12:26 PM By: Midge Aver MSN RN CNS WTA Signed: 04/17/2023 9:00:01 AM By: [name] PA-C Previous Signature: 04/16/2023 2:26:02 PM Version By: [name] PA-C Entered By: Midge Aver on 04/16/2023 13:12:26  Day/30 Days Discharge Instructions: Apply Kerlix as directed Secured With: Tubigrip Size D, 3x10 (in/yd) 1 x Per Day/30 Days Wound #22 - Lower Leg Wound Laterality: Right, Anterior Cleanser: Soap and Water 1 x Per Day/30 Days Discharge Instructions: Gently cleanse wound with antibacterial soap, rinse and pat dry prior to dressing wounds Cleanser: Wound Cleanser 1 x Per Day/30 Days Discharge Instructions: Wash your hands with soap and water. Remove old dressing, discard into plastic bag and place into trash. Cleanse the wound with  Wound Cleanser prior to applying a clean dressing using gauze sponges, not tissues or cotton balls. Do not scrub or use excessive force. Pat dry using gauze sponges, not tissue or cotton balls. Prim Dressing: Silvercel Small 2x2 (in/in) 1 x Per Day/30 Days ary Discharge Instructions: Apply Silvercel Small 2x2 (in/in) as instructed Secondary Dressing: ABD Pad 5x9 (in/in) 1 x Per Day/30 Days Discharge Instructions: Cover with ABD pad Secured With: Kerlix Roll Sterile or Non-Sterile 6-ply 4.5x4 (yd/yd) 1 x Per Day/30 Days Discharge Instructions: Apply Kerlix as directed Secured With: Tubigrip Size D, 3x10 (in/yd) 1 x Per Day/30 Days Laboratory Bacteria identified in Wound by Culture (MICRO) LOINC Code: [phone] Convenience Name: Wound culture routine Patient Medications llergies: penicillin A Notifications Medication Indication Start End 04/16/2023 levofloxacin DOSE 1 - oral 500 mg tablet - 1 tablet oral once daily x 14 days. Do not take calcium or multivitamin with this medication Electronic Signature(s) Signed: 04/16/2023 5:11:11 PM By: Midge Aver MSN RN CNS WTA Signed: 04/17/2023 9:00:01 AM By: [name] PA-C Previous Signature: 04/16/2023 2:22:31 PM Version By: [name] PA-C Entered By: Midge Aver on 04/16/2023 13:10:52 Prescription 04/16/2023 -------------------------------------------------------------------------------- Chad Heaps PA-C Patient Name: Provider: 05-25-1961 [id number] Date of Birth: NPI#Chad [name] Sex: DEA #: [id number] Phone #: License #: UPN: Patient Address: [address] [facility] [place], [place] [address] [place] [address] [place], [place] [address] Chad [name] ([id number]) [id number] Page 9 of 17 [phone] Allergies penicillin Medication Medication: Route: Strength: Form: levofloxacin 500 mg tablet  oral 500 mg tablet Class: QUINOLONE ANTIBIOTICS Dose: Frequency / Time: Indication: 1 1 tablet oral once daily x 14 days. Do not take calcium or multivitamin with this medication Number of Refills: Number of Units: 0 Fourteen (14) Generic Substitution: Start Date: End Date: One Time Use: Substitution Permitted 04/16/2023 No Note to Pharmacy: Hand Signature: Date(s): Electronic Signature(s) Signed: 04/16/2023 5:11:11 PM By: Midge Aver MSN RN CNS WTA Signed: 04/17/2023 9:00:01 AM By: [name] PA-C Previous Signature: 04/16/2023 2:26:46 PM Version By: [name] PA-C Entered By: Midge Aver on 04/16/2023 13:10:53 -------------------------------------------------------------------------------- Problem List Details Patient Name: Date of Service: Chad [name], Chad [name] 04/16/2023 1:15 PM Medical Record Number: [id number] Patient Account Number: [id number] Date of Birth/Sex: Treating RN: [birth year] (62 y.o. 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 No doubt this has a lot to do with the discomfort in the right leg he describes when he is in bed. They have been using Xeroform on the wounds on the right. He has a lot of swelling in this leg with weeping edema 04-09-2023 upon evaluation today patient appears to be doing poorly still in regard to his right leg. 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  Fortunately I do not see any signs of active infection locally or systemically at this time which is great news. No fevers, chills, nausea, vomiting, or diarrhea. With that being said I am concerned still about the fact that he seems to be getting worse especially in regard to the heels I think that he is still sitting up most of the day which I think is still causing some swelling of his leg unfortunately. 04-16-2023 upon evaluation today patient actually appears to be doing little bit more poorly compared to last week this actually does appear to be infected. Last week I really did not think that was the case but this seems like it may indeed be the case. Fortunately I do not see any signs of active infection locally or systemically at this time. Electronic Signature(s) Signed: 04/16/2023 2:17:53 PM By: [name] PA-C Entered By: [name] on 04/16/2023 11:17:53 Chad [name] ([id number]) [id number] Page 6 of 17 -------------------------------------------------------------------------------- Physical Exam Details Patient Name: Date of Service: Chad [name], Chad [name] 04/16/2023 1:15 PM Medical Record Number: [id number] Patient Account Number: [id number] Date of Birth/Sex: Treating RN: 04/28/1961 (62 y.o. [name] Primary Care Provider: [name] Other Clinician: Referring Provider: Treating Provider/Extender: [name] Weeks in Treatment: 36 Constitutional Well-nourished and well-hydrated in no acute distress. Respiratory normal breathing without difficulty. Psychiatric this patient is able to make decisions and demonstrates good insight into disease process. Alert and Oriented x 3. pleasant and cooperative. Notes Upon inspection patient's wound bed actually showed signs of good granulation and some areas he has slough and biofilm that others and leg was warm to touch and this is the biggest concern that I have at this point. He is  having a lot of drainage I think this is secondary to infection throughout she has some blue-green drainage I think this may be something that he is going require a treatment with Levaquin to help with the infection at this point. Electronic Signature(s) Signed: 04/16/2023 2:18:19 PM By: [name] PA-C Entered By: [name] on 04/16/2023 11:18:18 -------------------------------------------------------------------------------- Physician Orders Details Patient Name: Date of Service: Chad [name], Chad [name] 04/16/2023 1:15 PM Medical Record Number: [id number] Patient Account Number: [id number] Date of Birth/Sex: Treating RN: Mar 17, 1961 (62 y.o. 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 Nurse Visit as needed Bathing/ Shower/ Hygiene Clean wound with Normal Saline or wound cleanser.  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 No doubt this has a lot to do with the discomfort in the right leg he describes when he is in bed. They have been using Xeroform on the wounds on the right. He has a lot of swelling in this leg with weeping edema 04-09-2023 upon evaluation today patient appears to be doing poorly still in regard to his right leg. 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  Fortunately I do not see any signs of active infection locally or systemically at this time which is great news. No fevers, chills, nausea, vomiting, or diarrhea. With that being said I am concerned still about the fact that he seems to be getting worse especially in regard to the heels I think that he is still sitting up most of the day which I think is still causing some swelling of his leg unfortunately. 04-16-2023 upon evaluation today patient actually appears to be doing little bit more poorly compared to last week this actually does appear to be infected. Last week I really did not think that was the case but this seems like it may indeed be the case. Fortunately I do not see any signs of active infection locally or systemically at this time. Electronic Signature(s) Signed: 04/16/2023 2:17:53 PM By: [name] PA-C Entered By: [name] on 04/16/2023 11:17:53 Chad [name] ([id number]) [id number] Page 6 of 17 -------------------------------------------------------------------------------- Physical Exam Details Patient Name: Date of Service: Chad [name], Chad [name] 04/16/2023 1:15 PM Medical Record Number: [id number] Patient Account Number: [id number] Date of Birth/Sex: Treating RN: 04/28/1961 (62 y.o. [name] Primary Care Provider: [name] Other Clinician: Referring Provider: Treating Provider/Extender: [name] Weeks in Treatment: 36 Constitutional Well-nourished and well-hydrated in no acute distress. Respiratory normal breathing without difficulty. Psychiatric this patient is able to make decisions and demonstrates good insight into disease process. Alert and Oriented x 3. pleasant and cooperative. Notes Upon inspection patient's wound bed actually showed signs of good granulation and some areas he has slough and biofilm that others and leg was warm to touch and this is the biggest concern that I have at this point. He is  having a lot of drainage I think this is secondary to infection throughout she has some blue-green drainage I think this may be something that he is going require a treatment with Levaquin to help with the infection at this point. Electronic Signature(s) Signed: 04/16/2023 2:18:19 PM By: [name] PA-C Entered By: [name] on 04/16/2023 11:18:18 -------------------------------------------------------------------------------- Physician Orders Details Patient Name: Date of Service: Chad [name], Chad [name] 04/16/2023 1:15 PM Medical Record Number: [id number] Patient Account Number: [id number] Date of Birth/Sex: Treating RN: Mar 17, 1961 (62 y.o. 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 Nurse Visit as needed Bathing/ Shower/ Hygiene Clean wound with Normal Saline or wound cleanser.  Day/30 Days Discharge Instructions: Apply Kerlix as directed Secured With: Tubigrip Size D, 3x10 (in/yd) 1 x Per Day/30 Days Wound #22 - Lower Leg Wound Laterality: Right, Anterior Cleanser: Soap and Water 1 x Per Day/30 Days Discharge Instructions: Gently cleanse wound with antibacterial soap, rinse and pat dry prior to dressing wounds Cleanser: Wound Cleanser 1 x Per Day/30 Days Discharge Instructions: Wash your hands with soap and water. Remove old dressing, discard into plastic bag and place into trash. Cleanse the wound with  Wound Cleanser prior to applying a clean dressing using gauze sponges, not tissues or cotton balls. Do not scrub or use excessive force. Pat dry using gauze sponges, not tissue or cotton balls. Prim Dressing: Silvercel Small 2x2 (in/in) 1 x Per Day/30 Days ary Discharge Instructions: Apply Silvercel Small 2x2 (in/in) as instructed Secondary Dressing: ABD Pad 5x9 (in/in) 1 x Per Day/30 Days Discharge Instructions: Cover with ABD pad Secured With: Kerlix Roll Sterile or Non-Sterile 6-ply 4.5x4 (yd/yd) 1 x Per Day/30 Days Discharge Instructions: Apply Kerlix as directed Secured With: Tubigrip Size D, 3x10 (in/yd) 1 x Per Day/30 Days Laboratory Bacteria identified in Wound by Culture (MICRO) LOINC Code: [phone] Convenience Name: Wound culture routine Patient Medications llergies: penicillin A Notifications Medication Indication Start End 04/16/2023 levofloxacin DOSE 1 - oral 500 mg tablet - 1 tablet oral once daily x 14 days. Do not take calcium or multivitamin with this medication Electronic Signature(s) Signed: 04/16/2023 5:11:11 PM By: Midge Aver MSN RN CNS WTA Signed: 04/17/2023 9:00:01 AM By: [name] PA-C Previous Signature: 04/16/2023 2:22:31 PM Version By: [name] PA-C Entered By: Midge Aver on 04/16/2023 13:10:52 Prescription 04/16/2023 -------------------------------------------------------------------------------- Chad Heaps PA-C Patient Name: Provider: 05-25-1961 [id number] Date of Birth: NPI#Chad [name] Sex: DEA #: [id number] Phone #: License #: UPN: Patient Address: [address] [facility] [place], [place] [address] [place] [address] [place], [place] [address] Chad [name] ([id number]) [id number] Page 9 of 17 [phone] Allergies penicillin Medication Medication: Route: Strength: Form: levofloxacin 500 mg tablet  oral 500 mg tablet Class: QUINOLONE ANTIBIOTICS Dose: Frequency / Time: Indication: 1 1 tablet oral once daily x 14 days. Do not take calcium or multivitamin with this medication Number of Refills: Number of Units: 0 Fourteen (14) Generic Substitution: Start Date: End Date: One Time Use: Substitution Permitted 04/16/2023 No Note to Pharmacy: Hand Signature: Date(s): Electronic Signature(s) Signed: 04/16/2023 5:11:11 PM By: Midge Aver MSN RN CNS WTA Signed: 04/17/2023 9:00:01 AM By: [name] PA-C Previous Signature: 04/16/2023 2:26:46 PM Version By: [name] PA-C Entered By: Midge Aver on 04/16/2023 13:10:53 -------------------------------------------------------------------------------- Problem List Details Patient Name: Date of Service: Chad [name], Chad [name] 04/16/2023 1:15 PM Medical Record Number: [id number] Patient Account Number: [id number] Date of Birth/Sex: Treating RN: [birth year] (62 y.o. 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/19/2023 No Yes foot 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 Remove old dressing, discard into plastic bag and place into trash. Cleanse the wound with  Wound Cleanser prior to applying a clean dressing using gauze sponges, not tissues or cotton balls. Do not scrub or use excessive force. Pat dry using gauze sponges, not tissue or cotton balls. Prim Dressing: Silvercel Small 2x2 (in/in) 1 x Per Day/30 Days ary Discharge Instructions: Apply Silvercel Small 2x2 (in/in) as instructed Secondary Dressing: ABD Pad 5x9 (in/in) 1 x Per Day/30 Days Discharge Instructions: Cover with ABD pad Secured With: Kerlix Roll Sterile or Non-Sterile 6-ply 4.5x4 (yd/yd) 1 x Per Day/30 Days Discharge Instructions: Apply Kerlix as directed Secured With: Tubigrip Size D, 3x10 (in/yd) 1 x Per Day/30 Days Laboratory Bacteria identified in Wound by Culture (MICRO) LOINC Code: [phone] Convenience Name: Wound culture routine Patient Medications llergies: penicillin A Notifications Medication Indication Start End 04/16/2023 levofloxacin DOSE 1 - oral 500 mg tablet - 1 tablet oral once daily x 14 days. Do not take calcium or multivitamin with this medication Electronic Signature(s) Signed: 04/16/2023 5:11:11 PM By: Midge Aver MSN RN CNS WTA Signed: 04/17/2023 9:00:01 AM By: [name] PA-C Previous Signature: 04/16/2023 2:22:31 PM Version By: [name] PA-C Entered By: Midge Aver on 04/16/2023 13:10:52 Prescription 04/16/2023 -------------------------------------------------------------------------------- Chad Heaps PA-C Patient Name: Provider: 05-25-1961 [id number] Date of Birth: NPI#Chad [name] Sex: DEA #: [id number] Phone #: License #: UPN: Patient Address: [address] [facility] [place], [place] [address] [place] [address] [place], [place] [address] Chad [name] ([id number]) [id number] Page 9 of 17 [phone] Allergies penicillin Medication Medication: Route: Strength: Form: levofloxacin 500 mg tablet  oral 500 mg tablet Class: QUINOLONE ANTIBIOTICS Dose: Frequency / Time: Indication: 1 1 tablet oral once daily x 14 days. Do not take calcium or multivitamin with this medication Number of Refills: Number of Units: 0 Fourteen (14) Generic Substitution: Start Date: End Date: One Time Use: Substitution Permitted 04/16/2023 No Note to Pharmacy: Hand Signature: Date(s): Electronic Signature(s) Signed: 04/16/2023 5:11:11 PM By: Midge Aver MSN RN CNS WTA Signed: 04/17/2023 9:00:01 AM By: [name] PA-C Previous Signature: 04/16/2023 2:26:46 PM Version By: [name] PA-C Entered By: Midge Aver on 04/16/2023 13:10:53 -------------------------------------------------------------------------------- Problem List Details Patient Name: Date of Service: Chad [name], Chad [name] 04/16/2023 1:15 PM Medical Record Number: [id number] Patient Account Number: [id number] Date of Birth/Sex: Treating RN: [birth year] (62 y.o. 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/19/2023 No Yes foot 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 Remove old dressing, discard into plastic bag and place into trash. Cleanse the wound with  Wound Cleanser prior to applying a clean dressing using gauze sponges, not tissues or cotton balls. Do not scrub or use excessive force. Pat dry using gauze sponges, not tissue or cotton balls. Prim Dressing: Silvercel Small 2x2 (in/in) 1 x Per Day/30 Days ary Discharge Instructions: Apply Silvercel Small 2x2 (in/in) as instructed Secondary Dressing: ABD Pad 5x9 (in/in) 1 x Per Day/30 Days Discharge Instructions: Cover with ABD pad Secured With: Kerlix Roll Sterile or Non-Sterile 6-ply 4.5x4 (yd/yd) 1 x Per Day/30 Days Discharge Instructions: Apply Kerlix as directed Secured With: Tubigrip Size D, 3x10 (in/yd) 1 x Per Day/30 Days Laboratory Bacteria identified in Wound by Culture (MICRO) LOINC Code: [id number] Convenience Name: Wound culture routine Patient Medications llergies: penicillin A Notifications Medication Indication Start End 04/16/2023 levofloxacin DOSE 1 - oral 500 mg tablet - 1 tablet oral once daily x 14 days. Do not take calcium or multivitamin with this medication Electronic Signature(s) Signed: 04/16/2023 5:11:11 PM By: Midge Aver MSN RN CNS WTA Signed: 04/17/2023 9:00:01 AM By: [name] PA-C Previous Signature: 04/16/2023 2:22:31 PM Version By: [name] PA-C Entered By: Midge Aver on 04/16/2023 13:10:52 Prescription 04/16/2023 -------------------------------------------------------------------------------- Chad Heaps PA-C Patient Name: Provider: 05-25-1961 [id number] Date of Birth: NPI#Chad [name] Sex: DEA #: [id number] Phone #: License #: UPN: Patient Address: [address] [facility] [place], [place] [address] [place] [address] [place], [place] [address] Chad [name] ([id number]) [id number] Page 9 of 17 [phone] Allergies penicillin Medication Medication: Route: Strength: Form: levofloxacin 500 mg tablet  oral 500 mg tablet Class: QUINOLONE ANTIBIOTICS Dose: Frequency / Time: Indication: 1 1 tablet oral once daily x 14 days. Do not take calcium or multivitamin with this medication Number of Refills: Number of Units: 0 Fourteen (14) Generic Substitution: Start Date: End Date: One Time Use: Substitution Permitted 04/16/2023 No Note to Pharmacy: Hand Signature: Date(s): Electronic Signature(s) Signed: 04/16/2023 5:11:11 PM By: Midge Aver MSN RN CNS WTA Signed: 04/17/2023 9:00:01 AM By: [name] PA-C Previous Signature: 04/16/2023 2:26:46 PM Version By: [name] PA-C Entered By: Midge Aver on 04/16/2023 13:10:53 -------------------------------------------------------------------------------- Problem List Details Patient Name: Date of Service: Chad [name], Chad [name] 04/16/2023 1:15 PM Medical Record Number: [id number] Patient Account Number: [id number] Date of Birth/Sex: Treating RN: [birth year] (62 y.o. 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/19/2023 No Yes foot 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 Remove old dressing, discard into plastic bag and place into trash. Cleanse the wound with  Wound Cleanser prior to applying a clean dressing using gauze sponges, not tissues or cotton balls. Do not scrub or use excessive force. Pat dry using gauze sponges, not tissue or cotton balls. Prim Dressing: Silvercel Small 2x2 (in/in) 1 x Per Day/30 Days ary Discharge Instructions: Apply Silvercel Small 2x2 (in/in) as instructed Secondary Dressing: ABD Pad 5x9 (in/in) 1 x Per Day/30 Days Discharge Instructions: Cover with ABD pad Secured With: Kerlix Roll Sterile or Non-Sterile 6-ply 4.5x4 (yd/yd) 1 x Per Day/30 Days Discharge Instructions: Apply Kerlix as directed Secured With: Tubigrip Size D, 3x10 (in/yd) 1 x Per Day/30 Days Laboratory Bacteria identified in Wound by Culture (MICRO) LOINC Code: [phone] Convenience Name: Wound culture routine Patient Medications llergies: penicillin A Notifications Medication Indication Start End 04/16/2023 levofloxacin DOSE 1 - oral 500 mg tablet - 1 tablet oral once daily x 14 days. Do not take calcium or multivitamin with this medication Electronic Signature(s) Signed: 04/16/2023 5:11:11 PM By: Midge Aver MSN RN CNS WTA Signed: 04/17/2023 9:00:01 AM By: [name] PA-C Previous Signature: 04/16/2023 2:22:31 PM Version By: [name] PA-C Entered By: Midge Aver on 04/16/2023 13:10:52 Prescription 04/16/2023 -------------------------------------------------------------------------------- Chad Heaps PA-C Patient Name: Provider: 05-25-1961 [id number] Date of Birth: NPI#Chad [name] Sex: DEA #: [id number] Phone #: License #: UPN: Patient Address: [address] [facility] [place], [place] [address] [place] [address] [place], [place] [address] Chad [name] ([id number]) [id number] Page 9 of 17 [phone] Allergies penicillin Medication Medication: Route: Strength: Form: levofloxacin 500 mg tablet  oral 500 mg tablet Class: QUINOLONE ANTIBIOTICS Dose: Frequency / Time: Indication: 1 1 tablet oral once daily x 14 days. Do not take calcium or multivitamin with this medication Number of Refills: Number of Units: 0 Fourteen (14) Generic Substitution: Start Date: End Date: One Time Use: Substitution Permitted 04/16/2023 No Note to Pharmacy: Hand Signature: Date(s): Electronic Signature(s) Signed: 04/16/2023 5:11:11 PM By: Midge Aver MSN RN CNS WTA Signed: 04/17/2023 9:00:01 AM By: [name] PA-C Previous Signature: 04/16/2023 2:26:46 PM Version By: [name] PA-C Entered By: Midge Aver on 04/16/2023 13:10:53 -------------------------------------------------------------------------------- Problem List Details Patient Name: Date of Service: Chad [name], Chad [name] 04/16/2023 1:15 PM Medical Record Number: [id number] Patient Account Number: [id number] Date of Birth/Sex: Treating RN: [birth year] (62 y.o. 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 No doubt this has a lot to do with the discomfort in the right leg he describes when he is in bed. They have been using Xeroform on the wounds on the right. He has a lot of swelling in this leg with weeping edema 04-09-2023 upon evaluation today patient appears to be doing poorly still in regard to his right leg. 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  Fortunately I do not see any signs of active infection locally or systemically at this time which is great news. No fevers, chills, nausea, vomiting, or diarrhea. With that being said I am concerned still about the fact that he seems to be getting worse especially in regard to the heels I think that he is still sitting up most of the day which I think is still causing some swelling of his leg unfortunately. 04-16-2023 upon evaluation today patient actually appears to be doing little bit more poorly compared to last week this actually does appear to be infected. Last week I really did not think that was the case but this seems like it may indeed be the case. Fortunately I do not see any signs of active infection locally or systemically at this time. Electronic Signature(s) Signed: 04/16/2023 2:17:53 PM By: [name] PA-C Entered By: [name] on 04/16/2023 11:17:53 Chad [name] ([id number]) [id number] Page 6 of 17 -------------------------------------------------------------------------------- Physical Exam Details Patient Name: Date of Service: Chad [name], Chad [name] 04/16/2023 1:15 PM Medical Record Number: [id number] Patient Account Number: [id number] Date of Birth/Sex: Treating RN: 04/28/1961 (62 y.o. [name] Primary Care Provider: [name] Other Clinician: Referring Provider: Treating Provider/Extender: [name] Weeks in Treatment: 36 Constitutional Well-nourished and well-hydrated in no acute distress. Respiratory normal breathing without difficulty. Psychiatric this patient is able to make decisions and demonstrates good insight into disease process. Alert and Oriented x 3. pleasant and cooperative. Notes Upon inspection patient's wound bed actually showed signs of good granulation and some areas he has slough and biofilm that others and leg was warm to touch and this is the biggest concern that I have at this point. He is  having a lot of drainage I think this is secondary to infection throughout she has some blue-green drainage I think this may be something that he is going require a treatment with Levaquin to help with the infection at this point. Electronic Signature(s) Signed: 04/16/2023 2:18:19 PM By: [name] PA-C Entered By: [name] on 04/16/2023 11:18:18 -------------------------------------------------------------------------------- Physician Orders Details Patient Name: Date of Service: Chad [name], Chad [name] 04/16/2023 1:15 PM Medical Record Number: [id number] Patient Account Number: [id number] Date of Birth/Sex: Treating RN: Mar 17, 1961 (62 y.o. 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 Nurse Visit as needed Bathing/ Shower/ Hygiene Clean wound with Normal Saline or wound cleanser.  Day/30 Days Discharge Instructions: Apply Kerlix as directed Secured With: Tubigrip Size D, 3x10 (in/yd) 1 x Per Day/30 Days Wound #22 - Lower Leg Wound Laterality: Right, Anterior Cleanser: Soap and Water 1 x Per Day/30 Days Discharge Instructions: Gently cleanse wound with antibacterial soap, rinse and pat dry prior to dressing wounds Cleanser: Wound Cleanser 1 x Per Day/30 Days Discharge Instructions: Wash your hands with soap and water. Remove old dressing, discard into plastic bag and place into trash. Cleanse the wound with  Wound Cleanser prior to applying a clean dressing using gauze sponges, not tissues or cotton balls. Do not scrub or use excessive force. Pat dry using gauze sponges, not tissue or cotton balls. Prim Dressing: Silvercel Small 2x2 (in/in) 1 x Per Day/30 Days ary Discharge Instructions: Apply Silvercel Small 2x2 (in/in) as instructed Secondary Dressing: ABD Pad 5x9 (in/in) 1 x Per Day/30 Days Discharge Instructions: Cover with ABD pad Secured With: Kerlix Roll Sterile or Non-Sterile 6-ply 4.5x4 (yd/yd) 1 x Per Day/30 Days Discharge Instructions: Apply Kerlix as directed Secured With: Tubigrip Size D, 3x10 (in/yd) 1 x Per Day/30 Days Laboratory Bacteria identified in Wound by Culture (MICRO) LOINC Code: [phone] Convenience Name: Wound culture routine Patient Medications llergies: penicillin A Notifications Medication Indication Start End 04/16/2023 levofloxacin DOSE 1 - oral 500 mg tablet - 1 tablet oral once daily x 14 days. Do not take calcium or multivitamin with this medication Electronic Signature(s) Signed: 04/16/2023 5:11:11 PM By: Midge Aver MSN RN CNS WTA Signed: 04/17/2023 9:00:01 AM By: [name] PA-C Previous Signature: 04/16/2023 2:22:31 PM Version By: [name] PA-C Entered By: Midge Aver on 04/16/2023 13:10:52 Prescription 04/16/2023 -------------------------------------------------------------------------------- Chad Heaps PA-C Patient Name: Provider: 05-25-1961 [id number] Date of Birth: NPI#Chad [name] Sex: DEA #: [id number] Phone #: License #: UPN: Patient Address: [address] [facility] [place], [place] [address] [place] [address] [place], [place] [address] Chad [name] ([id number]) [id number] Page 9 of 17 [phone] Allergies penicillin Medication Medication: Route: Strength: Form: levofloxacin 500 mg tablet  oral 500 mg tablet Class: QUINOLONE ANTIBIOTICS Dose: Frequency / Time: Indication: 1 1 tablet oral once daily x 14 days. Do not take calcium or multivitamin with this medication Number of Refills: Number of Units: 0 Fourteen (14) Generic Substitution: Start Date: End Date: One Time Use: Substitution Permitted 04/16/2023 No Note to Pharmacy: Hand Signature: Date(s): Electronic Signature(s) Signed: 04/16/2023 5:11:11 PM By: Midge Aver MSN RN CNS WTA Signed: 04/17/2023 9:00:01 AM By: [name] PA-C Previous Signature: 04/16/2023 2:26:46 PM Version By: [name] PA-C Entered By: Midge Aver on 04/16/2023 13:10:53 -------------------------------------------------------------------------------- Problem List Details Patient Name: Date of Service: Chad [name], Chad [name] 04/16/2023 1:15 PM Medical Record Number: [id number] Patient Account Number: [id number] Date of Birth/Sex: Treating RN: [birth year] (62 y.o. 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 No doubt this has a lot to do with the discomfort in the right leg he describes when he is in bed. They have been using Xeroform on the wounds on the right. He has a lot of swelling in this leg with weeping edema 04-09-2023 upon evaluation today patient appears to be doing poorly still in regard to his right leg. 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  Fortunately I do not see any signs of active infection locally or systemically at this time which is great news. No fevers, chills, nausea, vomiting, or diarrhea. With that being said I am concerned still about the fact that he seems to be getting worse especially in regard to the heels I think that he is still sitting up most of the day which I think is still causing some swelling of his leg unfortunately. 04-16-2023 upon evaluation today patient actually appears to be doing little bit more poorly compared to last week this actually does appear to be infected. Last week I really did not think that was the case but this seems like it may indeed be the case. Fortunately I do not see any signs of active infection locally or systemically at this time. Electronic Signature(s) Signed: 04/16/2023 2:17:53 PM By: [name] PA-C Entered By: [name] on 04/16/2023 11:17:53 Chad [name] ([id number]) [id number] Page 6 of 17 -------------------------------------------------------------------------------- Physical Exam Details Patient Name: Date of Service: Chad [name], Chad [name] 04/16/2023 1:15 PM Medical Record Number: [id number] Patient Account Number: [id number] Date of Birth/Sex: Treating RN: 04/28/1961 (62 y.o. [name] Primary Care Provider: [name] Other Clinician: Referring Provider: Treating Provider/Extender: [name] Weeks in Treatment: 36 Constitutional Well-nourished and well-hydrated in no acute distress. Respiratory normal breathing without difficulty. Psychiatric this patient is able to make decisions and demonstrates good insight into disease process. Alert and Oriented x 3. pleasant and cooperative. Notes Upon inspection patient's wound bed actually showed signs of good granulation and some areas he has slough and biofilm that others and leg was warm to touch and this is the biggest concern that I have at this point. He is  having a lot of drainage I think this is secondary to infection throughout she has some blue-green drainage I think this may be something that he is going require a treatment with Levaquin to help with the infection at this point. Electronic Signature(s) Signed: 04/16/2023 2:18:19 PM By: [name] PA-C Entered By: [name] on 04/16/2023 11:18:18 -------------------------------------------------------------------------------- Physician Orders Details Patient Name: Date of Service: Chad [name], Chad [name] 04/16/2023 1:15 PM Medical Record Number: [id number] Patient Account Number: [id number] Date of Birth/Sex: Treating RN: Mar 17, 1961 (62 y.o. 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 Nurse Visit as needed Bathing/ Shower/ Hygiene Clean wound with Normal Saline or wound cleanser.  1 week here in the clinic. If anything worsens or changes patient will contact our office for additional recommendations. Electronic Signature(s) Signed: 04/16/2023 2:23:45 PM By: [name] PA-C Previous Signature: 04/16/2023 2:19:17 PM Version By: [name] PA-C Entered By: [name] on 04/16/2023 11:23:44 -------------------------------------------------------------------------------- SuperBill Details Patient Name: Date of Service: Chad [name], Chad [name] 04/16/2023 Medical Record Number: [id number] Patient Account Number: [id number] Date of Birth/Sex: Treating RN: July 18, 1961 (62 y.o. 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, stage 3 Facility Procedures : CPT4 Code: 84166063 Description: 01601 - WOUND CARE VISIT-LEV 5 EST PT Modifier: Quantity: 1 Physician Procedures : CPT4 Code Description Modifier  [id number] 99214 - WC PHYS LEVEL 4 - EST PT ICD-10 Diagnosis Description 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 (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 If anything worsens or changes patient will contact our office for additional recommendations. Electronic Signature(s) Signed: 04/16/2023 2:23:45 PM By: [name] PA-C Previous Signature: 04/16/2023 2:19:17 PM Version By: [name] PA-C Entered By: [name] on 04/16/2023 11:23:44 -------------------------------------------------------------------------------- SuperBill Details Patient Name: Date of Service: Chad [name], Chad [name] 04/16/2023 Medical Record Number: [id number] Patient Account Number: [id number] Date of Birth/Sex: Treating RN: July 18, 1961 (62 y.o. 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, stage 3 Facility Procedures : CPT4 Code: 84166063 Description: 01601 - WOUND CARE VISIT-LEV 5 EST PT Modifier: Quantity: 1 Physician Procedures : CPT4 Code Description Modifier  [id number] 99214 - WC PHYS LEVEL 4 - EST PT ICD-10 Diagnosis Description 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 (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 If anything worsens or changes patient will contact our office for additional recommendations. Electronic Signature(s) Signed: 04/16/2023 2:23:45 PM By: [name] PA-C Previous Signature: 04/16/2023 2:19:17 PM Version By: [name] PA-C Entered By: [name] on 04/16/2023 11:23:44 -------------------------------------------------------------------------------- SuperBill Details Patient Name: Date of Service: Chad [name], Chad [name] 04/16/2023 Medical Record Number: [id number] Patient Account Number: [id number] Date of Birth/Sex: Treating RN: July 18, 1961 (62 y.o. 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, stage 3 Facility Procedures : CPT4 Code: 84166063 Description: 01601 - WOUND CARE VISIT-LEV 5 EST PT Modifier: Quantity: 1 Physician Procedures : CPT4 Code Description Modifier  [id number] 99214 - WC PHYS LEVEL 4 - EST PT ICD-10 Diagnosis Description 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 No doubt this has a lot to do with the discomfort in the right leg he describes when he is in bed. They have been using Xeroform on the wounds on the right. He has a lot of swelling in this leg with weeping edema 04-09-2023 upon evaluation today patient appears to be doing poorly still in regard to his right leg. 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  Fortunately I do not see any signs of active infection locally or systemically at this time which is great news. No fevers, chills, nausea, vomiting, or diarrhea. With that being said I am concerned still about the fact that he seems to be getting worse especially in regard to the heels I think that he is still sitting up most of the day which I think is still causing some swelling of his leg unfortunately. 04-16-2023 upon evaluation today patient actually appears to be doing little bit more poorly compared to last week this actually does appear to be infected. Last week I really did not think that was the case but this seems like it may indeed be the case. Fortunately I do not see any signs of active infection locally or systemically at this time. Electronic Signature(s) Signed: 04/16/2023 2:17:53 PM By: [name] PA-C Entered By: [name] on 04/16/2023 11:17:53 Chad [name] ([id number]) [id number] Page 6 of 17 -------------------------------------------------------------------------------- Physical Exam Details Patient Name: Date of Service: Chad [name], Chad [name] 04/16/2023 1:15 PM Medical Record Number: [id number] Patient Account Number: [id number] Date of Birth/Sex: Treating RN: 04/28/1961 (62 y.o. [name] Primary Care Provider: [name] Other Clinician: Referring Provider: Treating Provider/Extender: [name] Weeks in Treatment: 36 Constitutional Well-nourished and well-hydrated in no acute distress. Respiratory normal breathing without difficulty. Psychiatric this patient is able to make decisions and demonstrates good insight into disease process. Alert and Oriented x 3. pleasant and cooperative. Notes Upon inspection patient's wound bed actually showed signs of good granulation and some areas he has slough and biofilm that others and leg was warm to touch and this is the biggest concern that I have at this point. He is  having a lot of drainage I think this is secondary to infection throughout she has some blue-green drainage I think this may be something that he is going require a treatment with Levaquin to help with the infection at this point. Electronic Signature(s) Signed: 04/16/2023 2:18:19 PM By: [name] PA-C Entered By: [name] on 04/16/2023 11:18:18 -------------------------------------------------------------------------------- Physician Orders Details Patient Name: Date of Service: Chad [name], Chad [name] 04/16/2023 1:15 PM Medical Record Number: [id number] Patient Account Number: [id number] Date of Birth/Sex: Treating RN: Mar 17, 1961 (62 y.o. 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 Nurse Visit as needed Bathing/ Shower/ Hygiene Clean wound with Normal Saline or wound cleanser.  Day/30 Days Discharge Instructions: Apply Kerlix as directed Secured With: Tubigrip Size D, 3x10 (in/yd) 1 x Per Day/30 Days Wound #22 - Lower Leg Wound Laterality: Right, Anterior Cleanser: Soap and Water 1 x Per Day/30 Days Discharge Instructions: Gently cleanse wound with antibacterial soap, rinse and pat dry prior to dressing wounds Cleanser: Wound Cleanser 1 x Per Day/30 Days Discharge Instructions: Wash your hands with soap and water. Remove old dressing, discard into plastic bag and place into trash. Cleanse the wound with  Wound Cleanser prior to applying a clean dressing using gauze sponges, not tissues or cotton balls. Do not scrub or use excessive force. Pat dry using gauze sponges, not tissue or cotton balls. Prim Dressing: Silvercel Small 2x2 (in/in) 1 x Per Day/30 Days ary Discharge Instructions: Apply Silvercel Small 2x2 (in/in) as instructed Secondary Dressing: ABD Pad 5x9 (in/in) 1 x Per Day/30 Days Discharge Instructions: Cover with ABD pad Secured With: Kerlix Roll Sterile or Non-Sterile 6-ply 4.5x4 (yd/yd) 1 x Per Day/30 Days Discharge Instructions: Apply Kerlix as directed Secured With: Tubigrip Size D, 3x10 (in/yd) 1 x Per Day/30 Days Laboratory Bacteria identified in Wound by Culture (MICRO) LOINC Code: [phone] Convenience Name: Wound culture routine Patient Medications llergies: penicillin A Notifications Medication Indication Start End 04/16/2023 levofloxacin DOSE 1 - oral 500 mg tablet - 1 tablet oral once daily x 14 days. Do not take calcium or multivitamin with this medication Electronic Signature(s) Signed: 04/16/2023 5:11:11 PM By: Midge Aver MSN RN CNS WTA Signed: 04/17/2023 9:00:01 AM By: [name] PA-C Previous Signature: 04/16/2023 2:22:31 PM Version By: [name] PA-C Entered By: Midge Aver on 04/16/2023 13:10:52 Prescription 04/16/2023 -------------------------------------------------------------------------------- Chad Heaps PA-C Patient Name: Provider: 05-25-1961 [id number] Date of Birth: NPI#Chad [name] Sex: DEA #: [id number] Phone #: License #: UPN: Patient Address: [address] [facility] [place], [place] [address] [place] [address] [place], [place] [address] Chad [name] ([id number]) [id number] Page 9 of 17 [phone] Allergies penicillin Medication Medication: Route: Strength: Form: levofloxacin 500 mg tablet  oral 500 mg tablet Class: QUINOLONE ANTIBIOTICS Dose: Frequency / Time: Indication: 1 1 tablet oral once daily x 14 days. Do not take calcium or multivitamin with this medication Number of Refills: Number of Units: 0 Fourteen (14) Generic Substitution: Start Date: End Date: One Time Use: Substitution Permitted 04/16/2023 No Note to Pharmacy: Hand Signature: Date(s): Electronic Signature(s) Signed: 04/16/2023 5:11:11 PM By: Midge Aver MSN RN CNS WTA Signed: 04/17/2023 9:00:01 AM By: [name] PA-C Previous Signature: 04/16/2023 2:26:46 PM Version By: [name] PA-C Entered By: Midge Aver on 04/16/2023 13:10:53 -------------------------------------------------------------------------------- Problem List Details Patient Name: Date of Service: Chad [name], Chad [name] 04/16/2023 1:15 PM Medical Record Number: [id number] Patient Account Number: [id number] Date of Birth/Sex: Treating RN: [birth year] (62 y.o. 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 No doubt this has a lot to do with the discomfort in the right leg he describes when he is in bed. They have been using Xeroform on the wounds on the right. He has a lot of swelling in this leg with weeping edema 04-09-2023 upon evaluation today patient appears to be doing poorly still in regard to his right leg. 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  Fortunately I do not see any signs of active infection locally or systemically at this time which is great news. No fevers, chills, nausea, vomiting, or diarrhea. With that being said I am concerned still about the fact that he seems to be getting worse especially in regard to the heels I think that he is still sitting up most of the day which I think is still causing some swelling of his leg unfortunately. 04-16-2023 upon evaluation today patient actually appears to be doing little bit more poorly compared to last week this actually does appear to be infected. Last week I really did not think that was the case but this seems like it may indeed be the case. Fortunately I do not see any signs of active infection locally or systemically at this time. Electronic Signature(s) Signed: 04/16/2023 2:17:53 PM By: [name] PA-C Entered By: [name] on 04/16/2023 11:17:53 Chad [name] ([id number]) [id number] Page 6 of 17 -------------------------------------------------------------------------------- Physical Exam Details Patient Name: Date of Service: Chad [name], Chad [name] 04/16/2023 1:15 PM Medical Record Number: [id number] Patient Account Number: [id number] Date of Birth/Sex: Treating RN: 04/28/1961 (62 y.o. [name] Primary Care Provider: [name] Other Clinician: Referring Provider: Treating Provider/Extender: [name] Weeks in Treatment: 36 Constitutional Well-nourished and well-hydrated in no acute distress. Respiratory normal breathing without difficulty. Psychiatric this patient is able to make decisions and demonstrates good insight into disease process. Alert and Oriented x 3. pleasant and cooperative. Notes Upon inspection patient's wound bed actually showed signs of good granulation and some areas he has slough and biofilm that others and leg was warm to touch and this is the biggest concern that I have at this point. He is  having a lot of drainage I think this is secondary to infection throughout she has some blue-green drainage I think this may be something that he is going require a treatment with Levaquin to help with the infection at this point. Electronic Signature(s) Signed: 04/16/2023 2:18:19 PM By: [name] PA-C Entered By: [name] on 04/16/2023 11:18:18 -------------------------------------------------------------------------------- Physician Orders Details Patient Name: Date of Service: Chad [name], Chad [name] 04/16/2023 1:15 PM Medical Record Number: [id number] Patient Account Number: [id number] Date of Birth/Sex: Treating RN: Mar 17, 1961 (62 y.o. 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 Nurse Visit as needed Bathing/ Shower/ Hygiene Clean wound with Normal Saline or wound cleanser.  1 week here in the clinic. If anything worsens or changes patient will contact our office for additional recommendations. Electronic Signature(s) Signed: 04/16/2023 2:23:45 PM By: [name] PA-C Previous Signature: 04/16/2023 2:19:17 PM Version By: [name] PA-C Entered By: [name] on 04/16/2023 11:23:44 -------------------------------------------------------------------------------- SuperBill Details Patient Name: Date of Service: Chad [name], Chad [name] 04/16/2023 Medical Record Number: [id number] Patient Account Number: [id number] Date of Birth/Sex: Treating RN: July 18, 1961 (62 y.o. 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, stage 3 Facility Procedures : CPT4 Code: 84166063 Description: 01601 - WOUND CARE VISIT-LEV 5 EST PT Modifier: Quantity: 1 Physician Procedures : CPT4 Code Description Modifier  [id number] 99214 - WC PHYS LEVEL 4 - EST PT ICD-10 Diagnosis Description 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 No doubt this has a lot to do with the discomfort in the right leg he describes when he is in bed. They have been using Xeroform on the wounds on the right. He has a lot of swelling in this leg with weeping edema 04-09-2023 upon evaluation today patient appears to be doing poorly still in regard to his right leg. 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  Fortunately I do not see any signs of active infection locally or systemically at this time which is great news. No fevers, chills, nausea, vomiting, or diarrhea. With that being said I am concerned still about the fact that he seems to be getting worse especially in regard to the heels I think that he is still sitting up most of the day which I think is still causing some swelling of his leg unfortunately. 04-16-2023 upon evaluation today patient actually appears to be doing little bit more poorly compared to last week this actually does appear to be infected. Last week I really did not think that was the case but this seems like it may indeed be the case. Fortunately I do not see any signs of active infection locally or systemically at this time. Electronic Signature(s) Signed: 04/16/2023 2:17:53 PM By: [name] PA-C Entered By: [name] on 04/16/2023 11:17:53 Chad [name] ([id number]) [id number] Page 6 of 17 -------------------------------------------------------------------------------- Physical Exam Details Patient Name: Date of Service: Chad [name], Chad [name] 04/16/2023 1:15 PM Medical Record Number: [id number] Patient Account Number: [id number] Date of Birth/Sex: Treating RN: 04/28/1961 (62 y.o. [name] Primary Care Provider: [name] Other Clinician: Referring Provider: Treating Provider/Extender: [name] Weeks in Treatment: 36 Constitutional Well-nourished and well-hydrated in no acute distress. Respiratory normal breathing without difficulty. Psychiatric this patient is able to make decisions and demonstrates good insight into disease process. Alert and Oriented x 3. pleasant and cooperative. Notes Upon inspection patient's wound bed actually showed signs of good granulation and some areas he has slough and biofilm that others and leg was warm to touch and this is the biggest concern that I have at this point. He is  having a lot of drainage I think this is secondary to infection throughout she has some blue-green drainage I think this may be something that he is going require a treatment with Levaquin to help with the infection at this point. Electronic Signature(s) Signed: 04/16/2023 2:18:19 PM By: [name] PA-C Entered By: [name] on 04/16/2023 11:18:18 -------------------------------------------------------------------------------- Physician Orders Details Patient Name: Date of Service: Chad [name], Chad [name] 04/16/2023 1:15 PM Medical Record Number: [id number] Patient Account Number: [id number] Date of Birth/Sex: Treating RN: Mar 17, 1961 (62 y.o. 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 Nurse Visit as needed Bathing/ Shower/ Hygiene Clean wound with Normal Saline or wound cleanser.  1 week here in the clinic. If anything worsens or changes patient will contact our office for additional recommendations. Electronic Signature(s) Signed: 04/16/2023 2:23:45 PM By: [name] PA-C Previous Signature: 04/16/2023 2:19:17 PM Version By: [name] PA-C Entered By: [name] on 04/16/2023 11:23:44 -------------------------------------------------------------------------------- SuperBill Details Patient Name: Date of Service: Chad [name], Chad [name] 04/16/2023 Medical Record Number: [id number] Patient Account Number: [id number] Date of Birth/Sex: Treating RN: July 18, 1961 (62 y.o. 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, stage 3 Facility Procedures : CPT4 Code: 84166063 Description: 01601 - WOUND CARE VISIT-LEV 5 EST PT Modifier: Quantity: 1 Physician Procedures : CPT4 Code Description Modifier  [id number] 99214 - WC PHYS LEVEL 4 - EST PT ICD-10 Diagnosis Description 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 (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 If anything worsens or changes patient will contact our office for additional recommendations. Electronic Signature(s) Signed: 04/16/2023 2:23:45 PM By: [name] PA-C Previous Signature: 04/16/2023 2:19:17 PM Version By: [name] PA-C Entered By: [name] on 04/16/2023 11:23:44 -------------------------------------------------------------------------------- SuperBill Details Patient Name: Date of Service: Chad [name], Chad [name] 04/16/2023 Medical Record Number: [id number] Patient Account Number: [id number] Date of Birth/Sex: Treating RN: July 18, 1961 (62 y.o. 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, stage 3 Facility Procedures : CPT4 Code: 84166063 Description: 01601 - WOUND CARE VISIT-LEV 5 EST PT Modifier: Quantity: 1 Physician Procedures : CPT4 Code Description Modifier  [id number] 99214 - WC PHYS LEVEL 4 - EST PT ICD-10 Diagnosis Description 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 (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 If anything worsens or changes patient will contact our office for additional recommendations. Electronic Signature(s) Signed: 04/16/2023 2:23:45 PM By: [name] PA-C Previous Signature: 04/16/2023 2:19:17 PM Version By: [name] PA-C Entered By: [name] on 04/16/2023 11:23:44 -------------------------------------------------------------------------------- SuperBill Details Patient Name: Date of Service: Chad [name], Chad [name] 04/16/2023 Medical Record Number: [id number] Patient Account Number: [id number] Date of Birth/Sex: Treating RN: July 18, 1961 (62 y.o. 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, stage 3 Facility Procedures : CPT4 Code: 84166063 Description: 01601 - WOUND CARE VISIT-LEV 5 EST PT Modifier: Quantity: 1 Physician Procedures : CPT4 Code Description Modifier  [id number] 99214 - WC PHYS LEVEL 4 - EST PT ICD-10 Diagnosis Description 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 (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 If anything worsens or changes patient will contact our office for additional recommendations. Electronic Signature(s) Signed: 04/16/2023 2:23:45 PM By: [name] PA-C Previous Signature: 04/16/2023 2:19:17 PM Version By: [name] PA-C Entered By: [name] on 04/16/2023 11:23:44 -------------------------------------------------------------------------------- SuperBill Details Patient Name: Date of Service: Chad [name], Chad [name] 04/16/2023 Medical Record Number: [id number] Patient Account Number: [id number] Date of Birth/Sex: Treating RN: July 18, 1961 (62 y.o. 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, stage 3 Facility Procedures : CPT4 Code: 84166063 Description: 01601 - WOUND CARE VISIT-LEV 5 EST PT Modifier: Quantity: 1 Physician Procedures : CPT4 Code Description Modifier  [id number] 99214 - WC PHYS LEVEL 4 - EST PT ICD-10 Diagnosis Description 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 (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 If anything worsens or changes patient will contact our office for additional recommendations. Electronic Signature(s) Signed: 04/16/2023 2:23:45 PM By: [name] PA-C Previous Signature: 04/16/2023 2:19:17 PM Version By: [name] PA-C Entered By: [name] on 04/16/2023 11:23:44 -------------------------------------------------------------------------------- SuperBill Details Patient Name: Date of Service: Chad [name], Chad [name] 04/16/2023 Medical Record Number: [id number] Patient Account Number: [id number] Date of Birth/Sex: Treating RN: July 18, 1961 (62 y.o. 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, stage 3 Facility Procedures : CPT4 Code: 84166063 Description: 01601 - WOUND CARE VISIT-LEV 5 EST PT Modifier: Quantity: 1 Physician Procedures : CPT4 Code Description Modifier  [id number] 99214 - WC PHYS LEVEL 4 - EST PT ICD-10 Diagnosis Description 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 No doubt this has a lot to do with the discomfort in the right leg he describes when he is in bed. They have been using Xeroform on the wounds on the right. He has a lot of swelling in this leg with weeping edema 04-09-2023 upon evaluation today patient appears to be doing poorly still in regard to his right leg. 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  Fortunately I do not see any signs of active infection locally or systemically at this time which is great news. No fevers, chills, nausea, vomiting, or diarrhea. With that being said I am concerned still about the fact that he seems to be getting worse especially in regard to the heels I think that he is still sitting up most of the day which I think is still causing some swelling of his leg unfortunately. 04-16-2023 upon evaluation today patient actually appears to be doing little bit more poorly compared to last week this actually does appear to be infected. Last week I really did not think that was the case but this seems like it may indeed be the case. Fortunately I do not see any signs of active infection locally or systemically at this time. Electronic Signature(s) Signed: 04/16/2023 2:17:53 PM By: [name] PA-C Entered By: [name] on 04/16/2023 11:17:53 Chad [name] ([id number]) [id number] Page 6 of 17 -------------------------------------------------------------------------------- Physical Exam Details Patient Name: Date of Service: Chad [name], Chad [name] 04/16/2023 1:15 PM Medical Record Number: [id number] Patient Account Number: [id number] Date of Birth/Sex: Treating RN: 04/28/1961 (62 y.o. [name] Primary Care Provider: [name] Other Clinician: Referring Provider: Treating Provider/Extender: [name] Weeks in Treatment: 36 Constitutional Well-nourished and well-hydrated in no acute distress. Respiratory normal breathing without difficulty. Psychiatric this patient is able to make decisions and demonstrates good insight into disease process. Alert and Oriented x 3. pleasant and cooperative. Notes Upon inspection patient's wound bed actually showed signs of good granulation and some areas he has slough and biofilm that others and leg was warm to touch and this is the biggest concern that I have at this point. He is  having a lot of drainage I think this is secondary to infection throughout she has some blue-green drainage I think this may be something that he is going require a treatment with Levaquin to help with the infection at this point. Electronic Signature(s) Signed: 04/16/2023 2:18:19 PM By: [name] PA-C Entered By: [name] on 04/16/2023 11:18:18 -------------------------------------------------------------------------------- Physician Orders Details Patient Name: Date of Service: Chad [name], Chad [name] 04/16/2023 1:15 PM Medical Record Number: [id number] Patient Account Number: [id number] Date of Birth/Sex: Treating RN: Mar 17, 1961 (62 y.o. 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 Nurse Visit as needed Bathing/ Shower/ Hygiene Clean wound with Normal Saline or wound cleanser.  1 week here in the clinic. If anything worsens or changes patient will contact our office for additional recommendations. Electronic Signature(s) Signed: 04/16/2023 2:23:45 PM By: [name] PA-C Previous Signature: 04/16/2023 2:19:17 PM Version By: [name] PA-C Entered By: [name] on 04/16/2023 11:23:44 -------------------------------------------------------------------------------- SuperBill Details Patient Name: Date of Service: Chad [name], Chad [name] 04/16/2023 Medical Record Number: [id number] Patient Account Number: [id number] Date of Birth/Sex: Treating RN: July 18, 1961 (62 y.o. 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, stage 3 Facility Procedures : CPT4 Code: 84166063 Description: 01601 - WOUND CARE VISIT-LEV 5 EST PT Modifier: Quantity: 1 Physician Procedures : CPT4 Code Description Modifier  [id number] 99214 - WC PHYS LEVEL 4 - EST PT ICD-10 Diagnosis Description 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 No doubt this has a lot to do with the discomfort in the right leg he describes when he is in bed. They have been using Xeroform on the wounds on the right. He has a lot of swelling in this leg with weeping edema 04-09-2023 upon evaluation today patient appears to be doing poorly still in regard to his right leg. 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  Fortunately I do not see any signs of active infection locally or systemically at this time which is great news. No fevers, chills, nausea, vomiting, or diarrhea. With that being said I am concerned still about the fact that he seems to be getting worse especially in regard to the heels I think that he is still sitting up most of the day which I think is still causing some swelling of his leg unfortunately. 04-16-2023 upon evaluation today patient actually appears to be doing little bit more poorly compared to last week this actually does appear to be infected. Last week I really did not think that was the case but this seems like it may indeed be the case. Fortunately I do not see any signs of active infection locally or systemically at this time. Electronic Signature(s) Signed: 04/16/2023 2:17:53 PM By: [name] PA-C Entered By: [name] on 04/16/2023 11:17:53 Chad [name] ([id number]) [id number] Page 6 of 17 -------------------------------------------------------------------------------- Physical Exam Details Patient Name: Date of Service: Chad [name], Chad [name] 04/16/2023 1:15 PM Medical Record Number: [id number] Patient Account Number: [id number] Date of Birth/Sex: Treating RN: 04/28/1961 (62 y.o. [name] Primary Care Provider: [name] Other Clinician: Referring Provider: Treating Provider/Extender: [name] Weeks in Treatment: 36 Constitutional Well-nourished and well-hydrated in no acute distress. Respiratory normal breathing without difficulty. Psychiatric this patient is able to make decisions and demonstrates good insight into disease process. Alert and Oriented x 3. pleasant and cooperative. Notes Upon inspection patient's wound bed actually showed signs of good granulation and some areas he has slough and biofilm that others and leg was warm to touch and this is the biggest concern that I have at this point. He is  having a lot of drainage I think this is secondary to infection throughout she has some blue-green drainage I think this may be something that he is going require a treatment with Levaquin to help with the infection at this point. Electronic Signature(s) Signed: 04/16/2023 2:18:19 PM By: [name] PA-C Entered By: [name] on 04/16/2023 11:18:18 -------------------------------------------------------------------------------- Physician Orders Details Patient Name: Date of Service: Chad [name], Chad [name] 04/16/2023 1:15 PM Medical Record Number: [id number] Patient Account Number: [id number] Date of Birth/Sex: Treating RN: Mar 17, 1961 (62 y.o. 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 Nurse Visit as needed Bathing/ Shower/ Hygiene Clean wound with Normal Saline or wound cleanser.  Day/30 Days Discharge Instructions: Apply Kerlix as directed Secured With: Tubigrip Size D, 3x10 (in/yd) 1 x Per Day/30 Days Wound #22 - Lower Leg Wound Laterality: Right, Anterior Cleanser: Soap and Water 1 x Per Day/30 Days Discharge Instructions: Gently cleanse wound with antibacterial soap, rinse and pat dry prior to dressing wounds Cleanser: Wound Cleanser 1 x Per Day/30 Days Discharge Instructions: Wash your hands with soap and water. Remove old dressing, discard into plastic bag and place into trash. Cleanse the wound with  Wound Cleanser prior to applying a clean dressing using gauze sponges, not tissues or cotton balls. Do not scrub or use excessive force. Pat dry using gauze sponges, not tissue or cotton balls. Prim Dressing: Silvercel Small 2x2 (in/in) 1 x Per Day/30 Days ary Discharge Instructions: Apply Silvercel Small 2x2 (in/in) as instructed Secondary Dressing: ABD Pad 5x9 (in/in) 1 x Per Day/30 Days Discharge Instructions: Cover with ABD pad Secured With: Kerlix Roll Sterile or Non-Sterile 6-ply 4.5x4 (yd/yd) 1 x Per Day/30 Days Discharge Instructions: Apply Kerlix as directed Secured With: Tubigrip Size D, 3x10 (in/yd) 1 x Per Day/30 Days Laboratory Bacteria identified in Wound by Culture (MICRO) LOINC Code: [phone] Convenience Name: Wound culture routine Patient Medications llergies: penicillin A Notifications Medication Indication Start End 04/16/2023 levofloxacin DOSE 1 - oral 500 mg tablet - 1 tablet oral once daily x 14 days. Do not take calcium or multivitamin with this medication Electronic Signature(s) Signed: 04/16/2023 5:11:11 PM By: Midge Aver MSN RN CNS WTA Signed: 04/17/2023 9:00:01 AM By: [name] PA-C Previous Signature: 04/16/2023 2:22:31 PM Version By: [name] PA-C Entered By: Midge Aver on 04/16/2023 13:10:52 Prescription 04/16/2023 -------------------------------------------------------------------------------- Chad Heaps PA-C Patient Name: Provider: 05-25-1961 [id number] Date of Birth: NPI#Chad [name] Sex: DEA #: [id number] Phone #: License #: UPN: Patient Address: [address] [facility] [place], [place] [address] [place] [address] [place], [place] [address] Chad [name] ([id number]) [id number] Page 9 of 17 [phone] Allergies penicillin Medication Medication: Route: Strength: Form: levofloxacin 500 mg tablet  oral 500 mg tablet Class: QUINOLONE ANTIBIOTICS Dose: Frequency / Time: Indication: 1 1 tablet oral once daily x 14 days. Do not take calcium or multivitamin with this medication Number of Refills: Number of Units: 0 Fourteen (14) Generic Substitution: Start Date: End Date: One Time Use: Substitution Permitted 04/16/2023 No Note to Pharmacy: Hand Signature: Date(s): Electronic Signature(s) Signed: 04/16/2023 5:11:11 PM By: Midge Aver MSN RN CNS WTA Signed: 04/17/2023 9:00:01 AM By: [name] PA-C Previous Signature: 04/16/2023 2:26:46 PM Version By: [name] PA-C Entered By: Midge Aver on 04/16/2023 13:10:53 -------------------------------------------------------------------------------- Problem List Details Patient Name: Date of Service: Chad [name], Chad [name] 04/16/2023 1:15 PM Medical Record Number: [id number] Patient Account Number: [id number] Date of Birth/Sex: Treating RN: [birth year] (62 y.o. 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

## 2023-04-22 ENCOUNTER — Ambulatory Visit
Admission: RE | Admit: 2023-04-22 | Discharge: 2023-04-22 | Disposition: A | Discharge status: Home / Self Care | Payer: Medicaid Other | Source: Ambulatory Visit | Attending: Internal Medicine | Admitting: Internal Medicine

## 2023-04-22 ENCOUNTER — Encounter: Payer: Self-pay | Admitting: Radiology

## 2023-04-22 ENCOUNTER — Other Ambulatory Visit (INDEPENDENT_AMBULATORY_CARE_PROVIDER_SITE_OTHER): Payer: Self-pay | Admitting: Nurse Practitioner

## 2023-04-22 DIAGNOSIS — C661 Malignant neoplasm of right ureter: Secondary | ICD-10-CM | POA: Insufficient documentation

## 2023-04-22 DIAGNOSIS — I739 Peripheral vascular disease, unspecified: Secondary | ICD-10-CM

## 2023-04-22 MED ORDER — IOHEXOL 300 MG/ML  SOLN
100.0000 mL | Freq: Once | INTRAMUSCULAR | Status: AC | PRN
  Administered 2023-04-22: 100 mL via INTRAVENOUS

## 2023-04-24 ENCOUNTER — Encounter: Payer: Medicaid Other | Admitting: Physician Assistant

## 2023-04-24 DIAGNOSIS — L89613 Pressure ulcer of right heel, stage 3: Secondary | ICD-10-CM | POA: Diagnosis not present

## 2023-04-24 NOTE — Progress Notes (Signed)
Wound Tracing (instead of photographs) []  - 0 Simple Wound Measurement - one wound X- 4 5 Complex Wound Measurement - multiple wounds INTERVENTIONS - Wound Dressings []  - 0 Small Wound Dressing one or multiple wounds X- 4 15 Medium Wound Dressing one or multiple wounds []  - 0 Large Wound Dressing one or multiple wounds []  - 0 Application of Medications - topical []  - 0 Application of Medications - injection [name] ([id number]) [id number] Page 3 of 16 INTERVENTIONS - Miscellaneous []  - 0 External ear exam []  - 0 Specimen Collection (cultures, biopsies, blood, body fluids, etc.) []  - 0 Specimen(s) / Culture(s) sent or taken to Lab for analysis []  - 0 Patient Transfer (multiple staff / Michiel Sites Lift / Similar devices) []  - 0 Simple Staple / Suture removal (25 or less) []  - 0 Complex Staple / Suture removal (26 or more) []  - 0 Hypo / Hyperglycemic Management (close monitor of Blood Glucose) []  - 0 Ankle / Brachial Index (ABI) - do not check if billed separately X- 1 5 Vital Signs Has the patient been seen at the hospital within the last three years: Yes Total Score: 175 Level Of Care: New/Established - Level 5 Electronic Signature(s) Signed: 04/24/2023 12:30:42 PM By: Midge Aver MSN RN CNS WTA Entered By: Midge Aver on 04/24/2023 08:57:43 -------------------------------------------------------------------------------- Encounter Discharge Information Details Patient Name: Date of Service: Chad [name] [name] 04/24/2023 10:45 A M Medical Record Number: [id number] Patient Account Number: [id number] Date of Birth/Sex: Treating RN: 08/21/1960 (62 y.o. [name] Primary Care Laderrick Wilk: [name] Other Clinician: Referring  Sadia Belfiore: Treating Fabrizzio Marcella/Extender: [name] in Treatment: 72 Encounter Discharge Information Items Discharge Condition: Stable Ambulatory Status: Wheelchair Discharge Destination: Skilled Nursing Facility Telephoned: No Orders Sent: Yes Transportation: Other Accompanied By: self Schedule Follow-up Appointment: Yes Clinical Summary of Care: Electronic Signature(s) Signed: 04/24/2023 11:58:36 AM By: Midge Aver MSN RN CNS WTA Entered By: Midge Aver on 04/24/2023 08:58:36 [name] ([id number]) [id number] Page 4 of 16 -------------------------------------------------------------------------------- Lower Extremity Assessment Details Patient Name: Date of Service: Chad [name] 04/24/2023 10:45 A M Medical Record Number: [id number] Patient Account Number: [id number] Date of Birth/Sex: Treating RN: 08-07-1960 (62 y.o. [name] Primary Care Quantay Zaremba: [name] Other Clinician: Referring Dejai Schubach: Treating Daelon Dunivan/Extender: [name] Weeks in Treatment: 72 Edema Assessment Assessed: [Left: No] Franne Forts: No] [Left: Edema] [Right: :] Calf Left: Right: Point of Measurement: 33 cm From Medial Instep 41 cm Ankle Left: Right: Point of Measurement: 12 cm From Medial Instep 24 cm Vascular Assessment Pulses: Dorsalis Pedis Palpable: [Right:Yes] Extremity colors, hair growth, and conditions: Extremity Color: [Right:Red] Hair Growth on Extremity: [Right:No] Temperature of Extremity: [Right:Warm] Capillary Refill: [Right:< 3 seconds] Dependent Rubor: [Right:No] Blanched when Elevated: [Right:No No] Toe Nail Assessment Left: Right: Thick: Yes Discolored: Yes Deformed: Yes Improper Length and Hygiene: Yes Electronic Signature(s) Signed: 04/24/2023 11:56:47 AM By: Midge Aver MSN RN CNS WTA Entered By: Midge Aver on 04/24/2023 08:56:46 [name] ([id number])  [id number] Page 5 of 16 -------------------------------------------------------------------------------- Multi Wound Chart Details Patient Name: Date of Service: Chad [name] 04/24/2023 10:45 A M Medical Record Number: [id number] Patient Account Number: [id number] Date of Birth/Sex: Treating RN: [birth year] (62 y.o. [name] Primary Care Tevan Marian: [name] Other Clinician: Referring Otto Felkins: Treating Glendon Fiser/Extender: [name] Weeks in Treatment: 72 Vital Signs Height(in): 69 Pulse(bpm): 106 Weight(lbs): 150 Blood Pressure(mmHg): 126/75 Body Mass Index(BMI): 22.1 Temperature(F): 98.2 Respiratory Rate(breaths/min): 18 [16:Photos:] Right Calcaneus Right, Medial T Second oe  Pain Assessment Details Patient Name: Date of Service: Chad [name] 04/24/2023 10:45 A M Medical Record Number:  [id number] Patient Account Number: [id number] Date of Birth/Sex: Treating RN: 1960/10/16 (62 y.o. [name] Primary Care Arrionna Serena: [name] Other Clinician: Referring Zaide Mcclenahan: Treating Nature Kueker/Extender: [name] Weeks in Treatment: 72 Active Problems Location of Pain Severity and Description of Pain Patient Has Paino No Site Locations Pain Management and Medication Current Pain Management: Electronic Signature(s) Signed: 04/24/2023 11:56:35 AM By: Midge Aver MSN RN CNS WTA Entered By: Midge Aver on 04/24/2023 08:56:35 -------------------------------------------------------------------------------- Patient/Caregiver Education Details Patient Name: Date of Service: Chad [name], Chad [name] 9/27/2024andnbsp10:45 A M Medical Record Number: [id number] Patient Account Number: [id number] Date of Birth/Gender: Treating RN: [birth year] (62 y.o. [name] Primary Care Physician: [name] Other Clinician: Referring Physician: Treating Physician/Extender: [name] Weeks in Treatment: [address] [place] ([id number]) [id number] Page 10 of 16 Education Assessment Education Provided To: Patient Education Topics Provided Wound/Skin Impairment: Handouts: Caring for Your Ulcer Methods: Explain/Verbal Responses: State content correctly Electronic Signature(s) Signed: 04/24/2023 12:30:42 PM By: Midge Aver MSN RN CNS WTA Entered By: Midge Aver on 04/24/2023 08:58:00 -------------------------------------------------------------------------------- Wound Assessment Details Patient Name: Date of Service: Chad [name] [name] 04/24/2023 10:45 A M Medical Record Number: [id number] Patient Account Number: [id number] Date of Birth/Sex: Treating RN: 11/08/1960 (62 y.o. [name] Primary Care Sinia Antosh: [name] Other Clinician: Referring Viktorya Arguijo: Treating Caelan Atchley/Extender: [name] Weeks in  Treatment: 72 Wound Status Wound Number: 16 Primary Pressure Ulcer Etiology: Wound Location: Right Calcaneus Wound Open Wounding Event: Gradually Appeared Status: Date Acquired: 09/26/2022 Comorbid Anemia, Chronic Obstructive Pulmonary Disease (COPD), Coronary Weeks Of Treatment: 28 History: Artery Disease, Peripheral Arterial Disease, Peripheral Venous Clustered Wound: Yes Disease, Hepatitis C, Osteoarthritis, Neuropathy, Received Chemotherapy Photos Wound Measurements Length: (cm) 9.8 Width: (cm) 4 Depth: (cm) 0.3 Area: (cm) 30.788 Volume: (cm) 9.236 % Reduction in Area: -3168.4% % Reduction in Volume: -4812.8% Epithelialization: None Wound Description Classification: Category/Stage III Exudate Amount: Medium Willcutt, Ermin ([id number]) Exudate Type: Serosanguineous Exudate Color: red, brown Slough/Fibrino Yes [id number] Page 11 of 16 Wound Bed Granulation Amount: Small (1-33%) Exposed Structure Granulation Quality: Red, Pink Fascia Exposed: No Necrotic Amount: Large (67-100%) Fat Layer (Subcutaneous Tissue) Exposed: Yes Necrotic Quality: Eschar, Adherent Slough Tendon Exposed: No Muscle Exposed: No Joint Exposed: No Bone Exposed: No Treatment Notes Wound #16 (Calcaneus) Wound Laterality: Right Cleanser Soap and Water Discharge Instruction: Gently cleanse wound with antibacterial soap, rinse and pat dry prior to dressing wounds Wound Cleanser Discharge Instruction: Wash your hands with soap and water. Remove old dressing, discard into plastic bag and place into trash. Cleanse the wound with Wound Cleanser prior to applying a clean dressing using gauze sponges, not tissues or cotton balls. Do not scrub or use excessive force. Pat dry using gauze sponges, not tissue or cotton balls. Peri-Wound Care Topical Primary Dressing Silvercel Small 2x2 (in/in) Discharge Instruction: Apply Silvercel Small 2x2 (in/in) as instructed Secondary  Dressing Zetuvit Plus 4x8 (in/in) Heel Cup Secured With [employer] Sterile or Non-Sterile 6-ply 4.5x4 (yd/yd) Discharge Instruction: Apply Kerlix as directed Tubigrip Size D, 3x10 (in/yd) Compression Wrap Compression Stockings Add-Ons Electronic Signature(s) Signed: 04/24/2023 12:30:42 PM By: Midge Aver MSN RN CNS WTA Entered By: Midge Aver on 04/24/2023 07:36:02 -------------------------------------------------------------------------------- Wound Assessment Details Patient Name: Date of Service: Chad [name] [name] 04/24/2023 10:45 A M Medical Record Number: [id number] Patient Account Number: [id number] Date of Birth/Sex: Treating RN: November 21, 1960 (62 y.o. [name]  Compression Stockings Add-Ons Wound #18 (Toe Second) Wound Laterality: Right, Medial Cleanser Soap and Water Discharge Instruction: Gently cleanse wound with antibacterial soap, rinse and pat dry prior to dressing wounds Wound Cleanser Discharge Instruction: Wash your hands with soap and water. Remove old dressing, discard into plastic bag and place into trash. Cleanse the JAQUANN, GUARISCO ([id number]) [id number] Page 7 of 16 wound with Wound Cleanser prior to applying a clean dressing using gauze sponges, not tissues or cotton balls. Do not scrub or use excessive force. Pat dry using gauze sponges, not tissue or cotton balls. Peri-Wound Care Topical Primary Dressing Silvercel Small 2x2  (in/in) Discharge Instruction: Apply Silvercel Small 2x2 (in/in) as instructed Secondary Dressing Zetuvit Plus 4x8 (in/in) Secured With [employer] Sterile or Non-Sterile 6-ply 4.5x4 (yd/yd) Discharge Instruction: Apply Kerlix as directed Tubigrip Size D, 3x10 (in/yd) Compression Wrap Compression Stockings Add-Ons Wound #21 (Foot) Wound Laterality: Dorsal, Right Cleanser Soap and Water Discharge Instruction: Gently cleanse wound with antibacterial soap, rinse and pat dry prior to dressing wounds Wound Cleanser Discharge Instruction: Wash your hands with soap and water. Remove old dressing, discard into plastic bag and place into trash. Cleanse the wound with Wound Cleanser prior to applying a clean dressing using gauze sponges, not tissues or cotton balls. Do not scrub or use excessive force. Pat dry using gauze sponges, not tissue or cotton balls. Peri-Wound Care Topical Primary Dressing Silvercel Small 2x2 (in/in) Discharge Instruction: Apply Silvercel Small 2x2 (in/in) as instructed Secondary Dressing Zetuvit Plus 4x8 (in/in) Secured With [employer] Sterile or Non-Sterile 6-ply 4.5x4 (yd/yd) Discharge Instruction: Apply Kerlix as directed Tubigrip Size D, 3x10 (in/yd) Compression Wrap Compression Stockings Add-Ons Wound #22 (Lower Leg) Wound Laterality: Right, Anterior Cleanser Soap and Water Discharge Instruction: Gently cleanse wound with antibacterial soap, rinse and pat dry prior to dressing wounds Wound Cleanser Discharge Instruction: Wash your hands with soap and water. Remove old dressing, discard into plastic bag and place into trash. Cleanse the wound with Wound Cleanser prior to applying a clean dressing using gauze sponges, not tissues or cotton balls. Do not scrub or use excessive force. Pat dry using gauze sponges, not tissue or cotton balls. Peri-Wound Care Topical Primary Dressing Silvercel Small 2x2 (in/in) Discharge Instruction: Apply Silvercel Small  2x2 (in/in) as instructed Secondary Dressing Zetuvit Plus 4x8 (in/in) Secured With [name] ([id number]) [id number] Page 8 of 16 Kerlix Roll Sterile or Non-Sterile 6-ply 4.5x4 (yd/yd) Discharge Instruction: Apply Kerlix as directed Tubigrip Size D, 3x10 (in/yd) Compression Wrap Compression Stockings Add-Ons Electronic Signature(s) Signed: 04/24/2023 11:57:04 AM By: Midge Aver MSN RN CNS WTA Entered By: Midge Aver on 04/24/2023 08:57:04 -------------------------------------------------------------------------------- Multi-Disciplinary Care Plan Details Patient Name: Date of Service: JAKEOB, TULLIS 04/24/2023 10:45 A M Medical Record Number: [id number] Patient Account Number: [id number] Date of Birth/Sex: Treating RN: [birth year] (62 y.o. [name] Primary Care Dymon Summerhill: [name] Other Clinician: Referring Chayce Rullo: Treating Suzzane Quilter/Extender: [name] Weeks in Treatment: 72 Active Inactive Venous Leg Ulcer Nursing Diagnoses: Knowledge deficit related to disease process and management Goals: Patient will maintain optimal edema control Date Initiated: 12/06/2021 Target Resolution Date: 05/26/2023 Goal Status: Active Patient/caregiver will verbalize understanding of disease process and disease management Date Initiated: 12/06/2021 Date Inactivated: 12/19/2022 Target Resolution Date: 11/27/2022 Goal Status: Met Interventions: Assess peripheral edema status every visit. Compression as ordered Notes: Electronic Signature(s) Signed: 04/24/2023 11:57:55 AM By: Midge Aver MSN RN CNS WTA Entered By: Midge Aver on 04/24/2023 08:57:55 [name] ([id number]) [id number] Page 9 of 16 --------------------------------------------------------------------------------  Wound Tracing (instead of photographs) []  - 0 Simple Wound Measurement - one wound X- 4 5 Complex Wound Measurement - multiple wounds INTERVENTIONS - Wound Dressings []  - 0 Small Wound Dressing one or multiple wounds X- 4 15 Medium Wound Dressing one or multiple wounds []  - 0 Large Wound Dressing one or multiple wounds []  - 0 Application of Medications - topical []  - 0 Application of Medications - injection [name] ([id number]) [id number] Page 3 of 16 INTERVENTIONS - Miscellaneous []  - 0 External ear exam []  - 0 Specimen Collection (cultures, biopsies, blood, body fluids, etc.) []  - 0 Specimen(s) / Culture(s) sent or taken to Lab for analysis []  - 0 Patient Transfer (multiple staff / Michiel Sites Lift / Similar devices) []  - 0 Simple Staple / Suture removal (25 or less) []  - 0 Complex Staple / Suture removal (26 or more) []  - 0 Hypo / Hyperglycemic Management (close monitor of Blood Glucose) []  - 0 Ankle / Brachial Index (ABI) - do not check if billed separately X- 1 5 Vital Signs Has the patient been seen at the hospital within the last three years: Yes Total Score: 175 Level Of Care: New/Established - Level 5 Electronic Signature(s) Signed: 04/24/2023 12:30:42 PM By: Midge Aver MSN RN CNS WTA Entered By: Midge Aver on 04/24/2023 08:57:43 -------------------------------------------------------------------------------- Encounter Discharge Information Details Patient Name: Date of Service: Chad [name] [name] 04/24/2023 10:45 A M Medical Record Number: [id number] Patient Account Number: [id number] Date of Birth/Sex: Treating RN: 08/21/1960 (62 y.o. [name] Primary Care Laderrick Wilk: [name] Other Clinician: Referring  Sadia Belfiore: Treating Fabrizzio Marcella/Extender: [name] in Treatment: 72 Encounter Discharge Information Items Discharge Condition: Stable Ambulatory Status: Wheelchair Discharge Destination: Skilled Nursing Facility Telephoned: No Orders Sent: Yes Transportation: Other Accompanied By: self Schedule Follow-up Appointment: Yes Clinical Summary of Care: Electronic Signature(s) Signed: 04/24/2023 11:58:36 AM By: Midge Aver MSN RN CNS WTA Entered By: Midge Aver on 04/24/2023 08:58:36 [name] ([id number]) [id number] Page 4 of 16 -------------------------------------------------------------------------------- Lower Extremity Assessment Details Patient Name: Date of Service: Chad [name] 04/24/2023 10:45 A M Medical Record Number: [id number] Patient Account Number: [id number] Date of Birth/Sex: Treating RN: 08-07-1960 (62 y.o. [name] Primary Care Quantay Zaremba: [name] Other Clinician: Referring Dejai Schubach: Treating Daelon Dunivan/Extender: [name] Weeks in Treatment: 72 Edema Assessment Assessed: [Left: No] Franne Forts: No] [Left: Edema] [Right: :] Calf Left: Right: Point of Measurement: 33 cm From Medial Instep 41 cm Ankle Left: Right: Point of Measurement: 12 cm From Medial Instep 24 cm Vascular Assessment Pulses: Dorsalis Pedis Palpable: [Right:Yes] Extremity colors, hair growth, and conditions: Extremity Color: [Right:Red] Hair Growth on Extremity: [Right:No] Temperature of Extremity: [Right:Warm] Capillary Refill: [Right:< 3 seconds] Dependent Rubor: [Right:No] Blanched when Elevated: [Right:No No] Toe Nail Assessment Left: Right: Thick: Yes Discolored: Yes Deformed: Yes Improper Length and Hygiene: Yes Electronic Signature(s) Signed: 04/24/2023 11:56:47 AM By: Midge Aver MSN RN CNS WTA Entered By: Midge Aver on 04/24/2023 08:56:46 [name] ([id number])  [id number] Page 5 of 16 -------------------------------------------------------------------------------- Multi Wound Chart Details Patient Name: Date of Service: Chad [name] 04/24/2023 10:45 A M Medical Record Number: [id number] Patient Account Number: [id number] Date of Birth/Sex: Treating RN: [birth year] (62 y.o. [name] Primary Care Tevan Marian: [name] Other Clinician: Referring Otto Felkins: Treating Glendon Fiser/Extender: [name] Weeks in Treatment: 72 Vital Signs Height(in): 69 Pulse(bpm): 106 Weight(lbs): 150 Blood Pressure(mmHg): 126/75 Body Mass Index(BMI): 22.1 Temperature(F): 98.2 Respiratory Rate(breaths/min): 18 [16:Photos:] Right Calcaneus Right, Medial T Second oe  Primary Care Srinivas Lippman: [name] Other Clinician: Referring Rheana Casebolt: Treating Claudia Greenley/Extender: [name] Weeks in Treatment: 72 Wound Status Wound Number: 18 Primary Pressure Ulcer Etiology: Wound Location: Right, Medial thedford bunton, Jayvien ([id number]) [id number] Page 12 of 16 Wound Open Wounding Event: Pressure Injury Status: Date Acquired: 09/26/2022 Comorbid Anemia, Chronic Obstructive Pulmonary Disease (COPD), Coronary Weeks Of Treatment: 27 History: Artery Disease, Peripheral Arterial Disease, Peripheral Venous Clustered Wound: No Disease, Hepatitis C, Osteoarthritis, Neuropathy, Received Chemotherapy Photos Wound Measurements Length: (cm) 2.3 Width: (cm) 1.5 Depth: (cm) 0.1 Area: (cm) 2.71 Volume: (cm) 0.271 % Reduction in Area: -392.7% % Reduction in Volume: -392.7% Epithelialization: None Wound Description Classification: Category/Stage III Exudate Amount: Medium Exudate Type: Serosanguineous Exudate Color: red, brown Foul Odor After Cleansing: No Slough/Fibrino Yes Wound Bed Granulation Amount: Small (1-33%) Exposed Structure Granulation Quality: Red, Pink Fascia Exposed: No Necrotic Amount: Medium (34-66%) Fat Layer  (Subcutaneous Tissue) Exposed: Yes Necrotic Quality: Adherent Slough Tendon Exposed: No Muscle Exposed: No Joint Exposed: No Bone Exposed: No Treatment Notes Wound #18 (Toe Second) Wound Laterality: Right, Medial Cleanser Soap and Water Discharge Instruction: Gently cleanse wound with antibacterial soap, rinse and pat dry prior to dressing wounds Wound Cleanser Discharge Instruction: Wash your hands with soap and water. Remove old dressing, discard into plastic bag and place into trash. Cleanse the wound with Wound Cleanser prior to applying a clean dressing using gauze sponges, not tissues or cotton balls. Do not scrub or use excessive force. Pat dry using gauze sponges, not tissue or cotton balls. Peri-Wound Care Topical Primary Dressing Silvercel Small 2x2 (in/in) Discharge Instruction: Apply Silvercel Small 2x2 (in/in) as instructed Secondary Dressing Zetuvit Plus 4x8 (in/in) Secured With [employer] Sterile or Non-Sterile 6-ply 4.5x4 (yd/yd) Discharge Instruction: Apply Kerlix as directed Tubigrip Size D, 3x10 (in/yd) Compression Wrap Compression Stockings Add-Ons TYREIK, DELAHOUSSAYE ([id number]) [id number] Page 13 of 16 Electronic Signature(s) Signed: 04/24/2023 12:30:42 PM By: Midge Aver MSN RN CNS WTA Entered By: Midge Aver on 04/24/2023 07:36:29 -------------------------------------------------------------------------------- Wound Assessment Details Patient Name: Date of Service: LUKAH, GOSWAMI 04/24/2023 10:45 A M Medical Record Number: [id number] Patient Account Number: [id number] Date of Birth/Sex: Treating RN: 08/24/1960 (62 y.o. [name] Primary Care Aeneas Longsworth: [name] Other Clinician: Referring Eddis Pingleton: Treating Ladaja Yusupov/Extender: [name] Weeks in Treatment: 72 Wound Status Wound Number: 21 Primary Venous Leg Ulcer Etiology: Wound Location: Right, Dorsal Foot Wound Open Wounding Event: Pressure  Injury Status: Date Acquired: 03/02/2023 Comorbid Anemia, Chronic Obstructive Pulmonary Disease (COPD), Coronary Weeks Of Treatment: 4 History: Artery Disease, Peripheral Arterial Disease, Peripheral Venous Clustered Wound: Yes Disease, Hepatitis C, Osteoarthritis, Neuropathy, Received Chemotherapy Photos Wound Measurements Length: (cm) 4.5 Width: (cm) 4.5 Depth: (cm) 0.2 Area: (cm) 15.904 Volume: (cm) 3.181 % Reduction in Area: -800.1% % Reduction in Volume: -1697.2% Epithelialization: Medium (34-66%) Wound Description Classification: Full Thickness Without Exposed Support Exudate Amount: Medium Exudate Type: Serous Exudate Color: amber Structures Foul Odor After Cleansing: No Slough/Fibrino Yes Wound Bed Granulation Amount: Small (1-33%) Exposed Structure Granulation Quality: Red, Pink Fascia Exposed: No Necrotic Amount: Small (1-33%) Fat Layer (Subcutaneous Tissue) Exposed: Yes Necrotic Quality: Adherent Slough Tendon Exposed: No Muscle Exposed: No Joint Exposed: No Bone Exposed: No Treatment Notes THEON, SOBOTKA ([id number]) [id number] Page 14 of 16 Wound #21 (Foot) Wound Laterality: Dorsal, Right Cleanser Soap and Water Discharge Instruction: Gently cleanse wound with antibacterial soap, rinse and pat dry prior to dressing wounds Wound Cleanser Discharge Instruction: Wash your hands with soap and water. Remove  JAHZEEL, POYTHRESS ([id number]) [id number] Page 1 of 16 Visit Report for 04/24/2023 Arrival Information Details Patient Name: Date of Service: TSUGIO, ELISON 04/24/2023 10:45 A M Medical Record Number: [id number] Patient Account Number: [id number] Date of Birth/Sex: Treating RN: Jun 01, 1961 (62 y.o. [name] Primary Care Slayden Mennenga: [name] Other Clinician: Referring Rama Mcclintock: Treating Ariadna Setter/Extender: [name] in Treatment: 72 Visit Information History Since Last Visit Added or deleted any medications: No Patient Arrived: Wheel Chair Any new allergies or adverse reactions: No Arrival Time: 10:09 Has Dressing in Place as Prescribed: Yes Accompanied By: self Pain Present Now: No Transfer Assistance: Transfer Board Patient Identification Verified: Yes Secondary Verification Process Completed: Yes Patient Requires Transmission-Based No Precautions: Patient Has Alerts: Yes Patient Alerts: Patient on Blood Thinner NOT diabetic aspirin 81mg  Lives [place] [facility] ABI R 0.81 11/05/22 ABI L 0.59 11/05/22 [facility] 03/03/23 Electronic Signature(s) Signed: 04/24/2023 11:56:26 AM By: Midge Aver MSN RN CNS WTA Entered By: Midge Aver on 04/24/2023 08:56:26 -------------------------------------------------------------------------------- Clinic Level of Care Assessment Details Patient Name: Date of Service: Chad [name], Chad [name] 04/24/2023 10:45 A M Medical Record Number: [id number] Patient Account Number: [id number] Date of Birth/Sex: Treating RN: 10/22/1960 (62 y.o. [name] Primary Care Harleigh Civello: [name] Other Clinician: Referring Katera Rybka: Treating Cecile Guevara/Extender: [name] Weeks in Treatment: 72 Clinic Level of Care Assessment Items TOOL 4 Quantity Score X- 1 0 Use when only an EandM is performed on FOLLOW-UP visit ASSESSMENTS - Nursing Assessment / Reassessment TANDY, LEWIN ([id number])  [id number] Page 2 of 16 X- 1 10 Reassessment of Co-morbidities (includes updates in patient status) X- 1 5 Reassessment of Adherence to Treatment Plan ASSESSMENTS - Wound and Skin A ssessment / Reassessment []  - 0 Simple Wound Assessment / Reassessment - one wound X- 1 5 Complex Wound Assessment / Reassessment - multiple wounds []  - 0 Dermatologic / Skin Assessment (not related to wound area) ASSESSMENTS - Focused Assessment []  - 0 Circumferential Edema Measurements - multi extremities []  - 0 Nutritional Assessment / Counseling / Intervention []  - 0 Lower Extremity Assessment (monofilament, tuning fork, pulses) []  - 0 Peripheral Arterial Disease Assessment (using hand held doppler) ASSESSMENTS - Ostomy and/or Continence Assessment and Care []  - 0 Incontinence Assessment and Management []  - 0 Ostomy Care Assessment and Management (repouching, etc.) PROCESS - Coordination of Care []  - 0 Simple Patient / Family Education for ongoing care X- 1 20 Complex (extensive) Patient / Family Education for ongoing care X- 1 10 Staff obtains Chiropractor, Records, T Results / Process Orders est []  - 0 Staff telephones HHA, Nursing Homes / Clarify orders / etc []  - 0 Routine Transfer to another Facility (non-emergent condition) []  - 0 Routine Hospital Admission (non-emergent condition) []  - 0 New Admissions / Manufacturing engineer / Ordering NPWT Apligraf, etc. , []  - 0 Emergency Hospital Admission (emergent condition) []  - 0 Simple Discharge Coordination X- 1 15 Complex (extensive) Discharge Coordination PROCESS - Special Needs []  - 0 Pediatric / Minor Patient Management []  - 0 Isolation Patient Management []  - 0 Hearing / Language / Visual special needs []  - 0 Assessment of Community assistance (transportation, D/C planning, etc.) []  - 0 Additional assistance / Altered mentation []  - 0 Support Surface(s) Assessment (bed, cushion, seat,  etc.) INTERVENTIONS - Wound Cleansing / Measurement []  - 0 Simple Wound Cleansing - one wound X- 4 5 Complex Wound Cleansing - multiple wounds X- 1 5 Wound Imaging (photographs - any number of wounds) []  - 0  Pain Assessment Details Patient Name: Date of Service: Chad [name] 04/24/2023 10:45 A M Medical Record Number:  [id number] Patient Account Number: [id number] Date of Birth/Sex: Treating RN: 1960/10/16 (62 y.o. [name] Primary Care Arrionna Serena: [name] Other Clinician: Referring Zaide Mcclenahan: Treating Nature Kueker/Extender: [name] Weeks in Treatment: 72 Active Problems Location of Pain Severity and Description of Pain Patient Has Paino No Site Locations Pain Management and Medication Current Pain Management: Electronic Signature(s) Signed: 04/24/2023 11:56:35 AM By: Midge Aver MSN RN CNS WTA Entered By: Midge Aver on 04/24/2023 08:56:35 -------------------------------------------------------------------------------- Patient/Caregiver Education Details Patient Name: Date of Service: Chad [name], Chad [name] 9/27/2024andnbsp10:45 A M Medical Record Number: [id number] Patient Account Number: [id number] Date of Birth/Gender: Treating RN: [birth year] (62 y.o. [name] Primary Care Physician: [name] Other Clinician: Referring Physician: Treating Physician/Extender: [name] Weeks in Treatment: [address] [place] ([id number]) [id number] Page 10 of 16 Education Assessment Education Provided To: Patient Education Topics Provided Wound/Skin Impairment: Handouts: Caring for Your Ulcer Methods: Explain/Verbal Responses: State content correctly Electronic Signature(s) Signed: 04/24/2023 12:30:42 PM By: Midge Aver MSN RN CNS WTA Entered By: Midge Aver on 04/24/2023 08:58:00 -------------------------------------------------------------------------------- Wound Assessment Details Patient Name: Date of Service: Chad [name] [name] 04/24/2023 10:45 A M Medical Record Number: [id number] Patient Account Number: [id number] Date of Birth/Sex: Treating RN: 11/08/1960 (62 y.o. [name] Primary Care Sinia Antosh: [name] Other Clinician: Referring Viktorya Arguijo: Treating Caelan Atchley/Extender: [name] Weeks in  Treatment: 72 Wound Status Wound Number: 16 Primary Pressure Ulcer Etiology: Wound Location: Right Calcaneus Wound Open Wounding Event: Gradually Appeared Status: Date Acquired: 09/26/2022 Comorbid Anemia, Chronic Obstructive Pulmonary Disease (COPD), Coronary Weeks Of Treatment: 28 History: Artery Disease, Peripheral Arterial Disease, Peripheral Venous Clustered Wound: Yes Disease, Hepatitis C, Osteoarthritis, Neuropathy, Received Chemotherapy Photos Wound Measurements Length: (cm) 9.8 Width: (cm) 4 Depth: (cm) 0.3 Area: (cm) 30.788 Volume: (cm) 9.236 % Reduction in Area: -3168.4% % Reduction in Volume: -4812.8% Epithelialization: None Wound Description Classification: Category/Stage III Exudate Amount: Medium Willcutt, Ermin ([id number]) Exudate Type: Serosanguineous Exudate Color: red, brown Slough/Fibrino Yes [id number] Page 11 of 16 Wound Bed Granulation Amount: Small (1-33%) Exposed Structure Granulation Quality: Red, Pink Fascia Exposed: No Necrotic Amount: Large (67-100%) Fat Layer (Subcutaneous Tissue) Exposed: Yes Necrotic Quality: Eschar, Adherent Slough Tendon Exposed: No Muscle Exposed: No Joint Exposed: No Bone Exposed: No Treatment Notes Wound #16 (Calcaneus) Wound Laterality: Right Cleanser Soap and Water Discharge Instruction: Gently cleanse wound with antibacterial soap, rinse and pat dry prior to dressing wounds Wound Cleanser Discharge Instruction: Wash your hands with soap and water. Remove old dressing, discard into plastic bag and place into trash. Cleanse the wound with Wound Cleanser prior to applying a clean dressing using gauze sponges, not tissues or cotton balls. Do not scrub or use excessive force. Pat dry using gauze sponges, not tissue or cotton balls. Peri-Wound Care Topical Primary Dressing Silvercel Small 2x2 (in/in) Discharge Instruction: Apply Silvercel Small 2x2 (in/in) as instructed Secondary  Dressing Zetuvit Plus 4x8 (in/in) Heel Cup Secured With [employer] Sterile or Non-Sterile 6-ply 4.5x4 (yd/yd) Discharge Instruction: Apply Kerlix as directed Tubigrip Size D, 3x10 (in/yd) Compression Wrap Compression Stockings Add-Ons Electronic Signature(s) Signed: 04/24/2023 12:30:42 PM By: Midge Aver MSN RN CNS WTA Entered By: Midge Aver on 04/24/2023 07:36:02 -------------------------------------------------------------------------------- Wound Assessment Details Patient Name: Date of Service: Chad [name] [name] 04/24/2023 10:45 A M Medical Record Number: [id number] Patient Account Number: [id number] Date of Birth/Sex: Treating RN: November 21, 1960 (62 y.o. [name]  JAHZEEL, POYTHRESS ([id number]) [id number] Page 1 of 16 Visit Report for 04/24/2023 Arrival Information Details Patient Name: Date of Service: TSUGIO, ELISON 04/24/2023 10:45 A M Medical Record Number: [id number] Patient Account Number: [id number] Date of Birth/Sex: Treating RN: Jun 01, 1961 (62 y.o. [name] Primary Care Slayden Mennenga: [name] Other Clinician: Referring Rama Mcclintock: Treating Ariadna Setter/Extender: [name] in Treatment: 72 Visit Information History Since Last Visit Added or deleted any medications: No Patient Arrived: Wheel Chair Any new allergies or adverse reactions: No Arrival Time: 10:09 Has Dressing in Place as Prescribed: Yes Accompanied By: self Pain Present Now: No Transfer Assistance: Transfer Board Patient Identification Verified: Yes Secondary Verification Process Completed: Yes Patient Requires Transmission-Based No Precautions: Patient Has Alerts: Yes Patient Alerts: Patient on Blood Thinner NOT diabetic aspirin 81mg  Lives [place] [facility] ABI R 0.81 11/05/22 ABI L 0.59 11/05/22 [facility] 03/03/23 Electronic Signature(s) Signed: 04/24/2023 11:56:26 AM By: Midge Aver MSN RN CNS WTA Entered By: Midge Aver on 04/24/2023 08:56:26 -------------------------------------------------------------------------------- Clinic Level of Care Assessment Details Patient Name: Date of Service: Chad [name], Chad [name] 04/24/2023 10:45 A M Medical Record Number: [id number] Patient Account Number: [id number] Date of Birth/Sex: Treating RN: 10/22/1960 (62 y.o. [name] Primary Care Harleigh Civello: [name] Other Clinician: Referring Katera Rybka: Treating Cecile Guevara/Extender: [name] Weeks in Treatment: 72 Clinic Level of Care Assessment Items TOOL 4 Quantity Score X- 1 0 Use when only an EandM is performed on FOLLOW-UP visit ASSESSMENTS - Nursing Assessment / Reassessment TANDY, LEWIN ([id number])  [id number] Page 2 of 16 X- 1 10 Reassessment of Co-morbidities (includes updates in patient status) X- 1 5 Reassessment of Adherence to Treatment Plan ASSESSMENTS - Wound and Skin A ssessment / Reassessment []  - 0 Simple Wound Assessment / Reassessment - one wound X- 1 5 Complex Wound Assessment / Reassessment - multiple wounds []  - 0 Dermatologic / Skin Assessment (not related to wound area) ASSESSMENTS - Focused Assessment []  - 0 Circumferential Edema Measurements - multi extremities []  - 0 Nutritional Assessment / Counseling / Intervention []  - 0 Lower Extremity Assessment (monofilament, tuning fork, pulses) []  - 0 Peripheral Arterial Disease Assessment (using hand held doppler) ASSESSMENTS - Ostomy and/or Continence Assessment and Care []  - 0 Incontinence Assessment and Management []  - 0 Ostomy Care Assessment and Management (repouching, etc.) PROCESS - Coordination of Care []  - 0 Simple Patient / Family Education for ongoing care X- 1 20 Complex (extensive) Patient / Family Education for ongoing care X- 1 10 Staff obtains Chiropractor, Records, T Results / Process Orders est []  - 0 Staff telephones HHA, Nursing Homes / Clarify orders / etc []  - 0 Routine Transfer to another Facility (non-emergent condition) []  - 0 Routine Hospital Admission (non-emergent condition) []  - 0 New Admissions / Manufacturing engineer / Ordering NPWT Apligraf, etc. , []  - 0 Emergency Hospital Admission (emergent condition) []  - 0 Simple Discharge Coordination X- 1 15 Complex (extensive) Discharge Coordination PROCESS - Special Needs []  - 0 Pediatric / Minor Patient Management []  - 0 Isolation Patient Management []  - 0 Hearing / Language / Visual special needs []  - 0 Assessment of Community assistance (transportation, D/C planning, etc.) []  - 0 Additional assistance / Altered mentation []  - 0 Support Surface(s) Assessment (bed, cushion, seat,  etc.) INTERVENTIONS - Wound Cleansing / Measurement []  - 0 Simple Wound Cleansing - one wound X- 4 5 Complex Wound Cleansing - multiple wounds X- 1 5 Wound Imaging (photographs - any number of wounds) []  - 0

## 2023-04-24 NOTE — Progress Notes (Addendum)
Chad [name], Chad [name] ([id number]) [id number] Page 1 of 16 Visit Report for 04/24/2023 Chief Complaint Document Details Patient Name: Date of Service: Chad [name], Chad [name] 04/24/2023 10:45 A M Medical Record Number: [id number] Patient Account Number: [id number] Date of Birth/Sex: Treating RN: 05/20/1961 (62 y.o. [name] Primary Care Provider: [name] Other Clinician: Referring Provider: Treating Provider/Extender: [name] Weeks in Treatment: 72 Information Obtained from: Patient Chief Complaint Left ankle ulcer, Left dorsal foot wound, right heel Electronic Signature(s) Signed: 04/24/2023 10:30:42 AM By: [name] PA-C Entered By: [name] on 04/24/2023 07:30:41 -------------------------------------------------------------------------------- HPI Details Patient Name: Date of Service: Chad [name] 04/24/2023 10:45 A M Medical Record Number: [id number] Patient Account Number: [id number] Date of Birth/Sex: Treating RN: [birth year] (62 y.o. [name] Primary Care Provider: [name] Other Clinician: Referring Provider: Treating Provider/Extender: [name] Weeks in Treatment: 72 History of Present Illness HPI Description: 10/08/18 on evaluation today patient actually presents to our office for initial evaluation concerning wounds that he has of the bilateral lower extremities. 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 He also has bilateral venous stasis. He apparently has been homeless currently following discharge from the hospital apparently he has been placed at [facility] which is is a skilled nursing facility locally. Nonetheless fortunately he does not show any signs of infection at this time which is good news. In fact several of the wound  actually appears to be showing some signs of improvement already in my pinion. There are a couple areas in the left leg in particular there likely gonna require some sharp debridement to help clear away some necrotic tissue and help with more sufficient healing. No fevers, chills, nausea, or vomiting noted at this time. 10/15/18 on evaluation today patient actually appears to be doing very well in regard to his bilateral lower extremities. He's been tolerating the dressing changes without complication. Fortunately there does not appear to be any evidence of active infection at this time which is great news. Overall I'm actually very pleased with how this has progressed in just one visits time. Readmission: 08/14/2020 upon evaluation today patient presents for re-evaluation here in our clinic. He is having issues with his left ankle region as well as his right toe and his right heel. He tells me that the toe and heel actually began as a area that was itching that he was scratching and then subsequently opened up into wounds. These may have been abscess areas I presume based on what I am seeing currently. With regard to his left ankle region he tells me this was a similar type Chad [name], Chad [name] ([id number]) [id number] Page 2 of 16 occurrence although he does have venous stasis this very well may be more of a venous leg ulcer more than anything. Nonetheless I do believe that the patient would benefit from appropriate and aggressive wound care to try to help get things under better control here. He does have history of a stroke on the left side affecting him to some degree there that he is able to stand although he does have some residual weakness. Otherwise again the patient does have chronic venous insufficiency as previously noted. His arterial studies most recently obtained showed that he had an ABI on the right of 1.16 with a TBI of 0.52 and on the left and ABI of 1.14 with  dry prior to dressing wounds Cleanser: Wound Cleanser 1 x Per Day/30 Days Discharge Instructions: Wash your hands with soap and water. Remove old dressing, discard into plastic bag and place into trash. Cleanse the wound with Wound Cleanser prior to applying a clean dressing using gauze sponges, not tissues or cotton balls. Do not scrub or use excessive force. Pat dry using gauze sponges, not tissue or cotton balls. Prim Dressing: Silvercel Small 2x2 (in/in) 1 x Per Day/30 Days ary Discharge Instructions: Apply Silvercel Small 2x2 (in/in) as instructed Secondary Dressing: Zetuvit Plus 4x8 (in/in) 1 x Per Day/30 Days Secured With: Kerlix Roll Sterile or Non-Sterile 6-ply 4.5x4 (yd/yd) 1 x Per Day/30 Days Discharge Instructions: Apply Kerlix as directed Secured With: Tubigrip Size D, 3x10 (in/yd) 1 x Per Day/30 Days WOUND #22: - Lower Leg Wound Laterality: Right, Anterior Cleanser: Soap and Water 1 x Per Day/30 Days Discharge Instructions: Gently cleanse wound with antibacterial soap, rinse and pat dry prior to dressing wounds Cleanser: Wound Cleanser 1 x Per Day/30 Days Discharge Instructions: Wash your hands with soap and water. Remove old dressing, discard into plastic bag and place into trash. Cleanse the wound with Wound Cleanser prior to applying a clean dressing using gauze sponges, not tissues or cotton balls. Do not scrub or use excessive force. Pat dry using gauze sponges, not tissue or cotton balls. Prim Dressing: Silvercel Small 2x2 (in/in) 1 x Per Day/30 Days ary Discharge  Instructions: Apply Silvercel Small 2x2 (in/in) as instructed Secondary Dressing: Zetuvit Plus 4x8 (in/in) 1 x Per Day/30 Days Secured With: Kerlix Roll Sterile or Non-Sterile 6-ply 4.5x4 (yd/yd) 1 x Per Day/30 Days Discharge Instructions: Apply Kerlix as directed Secured With: Tubigrip Size D, 3x10 (in/yd) 1 x Per Day/30 Days 1. I am going to recommend that we have the patient continue to monitor for any evidence of worsening. His dressings do need to be changed daily. 2. I am going to recommend XtraSorb as that option here for him in order to keep the drainage off of his skin and prevent any additional maceration. 3. I am going to recommend as well that the patient should continue to monitor for any signs of infection or worsening if anything changes he knows to let me know and the facility should be monitoring as well. We will see patient back for reevaluation in 1 week here in the clinic. If anything worsens or changes patient will contact our office for additional recommendations. Electronic Signature(s) Signed: 04/24/2023 12:10:27 PM By: [name] PA-C Entered By: [name] on 04/24/2023 09:10:27 -------------------------------------------------------------------------------- SuperBill Details Patient Name: Date of Service: Chad [name], Chad [name] 04/24/2023 Medical Record Number: [id number] Patient Account Number: [id number] Date of Birth/Sex: Treating RN: [birth year] (62 y.o. [name] Primary Care Provider: [name] Other Clinician: Referring Provider: Treating Provider/Extender: [name] Weeks in Treatment: [address] Chad [name], Chad [name] ([id number]) [id number] Page 16 of 16 ICD-10 Codes Code Description [phone] Chronic venous hypertension (idiopathic) with ulcer and inflammation of left lower extremity 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.518 Non-pressure chronic ulcer of  other part of right foot with other specified severity 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 foot 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, stage 3 Facility Procedures : CPT4 Code: 21308657 Description: 84696 - WOUND CARE VISIT-LEV 5 EST PT Modifier: Quantity: 1 Physician Procedures : CPT4 Code Description Modifier [id number] 99213 - WC PHYS LEVEL 3 - EST  dry prior to dressing wounds Cleanser: Wound Cleanser 1 x Per Day/30 Days Discharge Instructions: Wash your hands with soap and water. Remove old dressing, discard into plastic bag and place into trash. Cleanse the wound with Wound Cleanser prior to applying a clean dressing using gauze sponges, not tissues or cotton balls. Do not scrub or use excessive force. Pat dry using gauze sponges, not tissue or cotton balls. Prim Dressing: Silvercel Small 2x2 (in/in) 1 x Per Day/30 Days ary Discharge Instructions: Apply Silvercel Small 2x2 (in/in) as instructed Secondary Dressing: Zetuvit Plus 4x8 (in/in) 1 x Per Day/30 Days Secured With: Kerlix Roll Sterile or Non-Sterile 6-ply 4.5x4 (yd/yd) 1 x Per Day/30 Days Discharge Instructions: Apply Kerlix as directed Secured With: Tubigrip Size D, 3x10 (in/yd) 1 x Per Day/30 Days WOUND #22: - Lower Leg Wound Laterality: Right, Anterior Cleanser: Soap and Water 1 x Per Day/30 Days Discharge Instructions: Gently cleanse wound with antibacterial soap, rinse and pat dry prior to dressing wounds Cleanser: Wound Cleanser 1 x Per Day/30 Days Discharge Instructions: Wash your hands with soap and water. Remove old dressing, discard into plastic bag and place into trash. Cleanse the wound with Wound Cleanser prior to applying a clean dressing using gauze sponges, not tissues or cotton balls. Do not scrub or use excessive force. Pat dry using gauze sponges, not tissue or cotton balls. Prim Dressing: Silvercel Small 2x2 (in/in) 1 x Per Day/30 Days ary Discharge  Instructions: Apply Silvercel Small 2x2 (in/in) as instructed Secondary Dressing: Zetuvit Plus 4x8 (in/in) 1 x Per Day/30 Days Secured With: Kerlix Roll Sterile or Non-Sterile 6-ply 4.5x4 (yd/yd) 1 x Per Day/30 Days Discharge Instructions: Apply Kerlix as directed Secured With: Tubigrip Size D, 3x10 (in/yd) 1 x Per Day/30 Days 1. I am going to recommend that we have the patient continue to monitor for any evidence of worsening. His dressings do need to be changed daily. 2. I am going to recommend XtraSorb as that option here for him in order to keep the drainage off of his skin and prevent any additional maceration. 3. I am going to recommend as well that the patient should continue to monitor for any signs of infection or worsening if anything changes he knows to let me know and the facility should be monitoring as well. We will see patient back for reevaluation in 1 week here in the clinic. If anything worsens or changes patient will contact our office for additional recommendations. Electronic Signature(s) Signed: 04/24/2023 12:10:27 PM By: [name] PA-C Entered By: [name] on 04/24/2023 09:10:27 -------------------------------------------------------------------------------- SuperBill Details Patient Name: Date of Service: Chad [name], Chad [name] 04/24/2023 Medical Record Number: [id number] Patient Account Number: [id number] Date of Birth/Sex: Treating RN: [birth year] (62 y.o. [name] Primary Care Provider: [name] Other Clinician: Referring Provider: Treating Provider/Extender: [name] Weeks in Treatment: [address] Chad [name], Chad [name] ([id number]) [id number] Page 16 of 16 ICD-10 Codes Code Description [phone] Chronic venous hypertension (idiopathic) with ulcer and inflammation of left lower extremity 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.518 Non-pressure chronic ulcer of  other part of right foot with other specified severity 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 foot 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, stage 3 Facility Procedures : CPT4 Code: 21308657 Description: 84696 - WOUND CARE VISIT-LEV 5 EST PT Modifier: Quantity: 1 Physician Procedures : CPT4 Code Description Modifier [id number] 99213 - WC PHYS LEVEL 3 - EST  Chad [name], Chad [name] ([id number]) [id number] Page 1 of 16 Visit Report for 04/24/2023 Chief Complaint Document Details Patient Name: Date of Service: Chad [name], Chad [name] 04/24/2023 10:45 A M Medical Record Number: [id number] Patient Account Number: [id number] Date of Birth/Sex: Treating RN: 05/20/1961 (62 y.o. [name] Primary Care Provider: [name] Other Clinician: Referring Provider: Treating Provider/Extender: [name] Weeks in Treatment: 72 Information Obtained from: Patient Chief Complaint Left ankle ulcer, Left dorsal foot wound, right heel Electronic Signature(s) Signed: 04/24/2023 10:30:42 AM By: [name] PA-C Entered By: [name] on 04/24/2023 07:30:41 -------------------------------------------------------------------------------- HPI Details Patient Name: Date of Service: Chad [name] 04/24/2023 10:45 A M Medical Record Number: [id number] Patient Account Number: [id number] Date of Birth/Sex: Treating RN: [birth year] (62 y.o. [name] Primary Care Provider: [name] Other Clinician: Referring Provider: Treating Provider/Extender: [name] Weeks in Treatment: 72 History of Present Illness HPI Description: 10/08/18 on evaluation today patient actually presents to our office for initial evaluation concerning wounds that he has of the bilateral lower extremities. 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 He also has bilateral venous stasis. He apparently has been homeless currently following discharge from the hospital apparently he has been placed at [facility] which is is a skilled nursing facility locally. Nonetheless fortunately he does not show any signs of infection at this time which is good news. In fact several of the wound  actually appears to be showing some signs of improvement already in my pinion. There are a couple areas in the left leg in particular there likely gonna require some sharp debridement to help clear away some necrotic tissue and help with more sufficient healing. No fevers, chills, nausea, or vomiting noted at this time. 10/15/18 on evaluation today patient actually appears to be doing very well in regard to his bilateral lower extremities. He's been tolerating the dressing changes without complication. Fortunately there does not appear to be any evidence of active infection at this time which is great news. Overall I'm actually very pleased with how this has progressed in just one visits time. Readmission: 08/14/2020 upon evaluation today patient presents for re-evaluation here in our clinic. He is having issues with his left ankle region as well as his right toe and his right heel. He tells me that the toe and heel actually began as a area that was itching that he was scratching and then subsequently opened up into wounds. These may have been abscess areas I presume based on what I am seeing currently. With regard to his left ankle region he tells me this was a similar type Chad [name], Chad [name] ([id number]) [id number] Page 2 of 16 occurrence although he does have venous stasis this very well may be more of a venous leg ulcer more than anything. Nonetheless I do believe that the patient would benefit from appropriate and aggressive wound care to try to help get things under better control here. He does have history of a stroke on the left side affecting him to some degree there that he is able to stand although he does have some residual weakness. Otherwise again the patient does have chronic venous insufficiency as previously noted. 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 No doubt this has a lot to do with the discomfort in the right leg he describes when he is in bed. They have been using Xeroform on the wounds on the right. He has a lot of swelling in this leg with weeping edema 04-09-2023 upon evaluation today patient appears to be doing poorly still in regard to his right leg. 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  Fortunately I do not see any signs of active infection locally or systemically at this time which is great news. No fevers, chills, nausea, vomiting, or diarrhea. With that being said I am concerned still about the fact that he seems to be getting worse especially in regard to the heels I think that he is still sitting up most of the day which I think is still causing some swelling of his leg unfortunately. 04-16-2023 upon evaluation today patient actually appears to be doing little bit more poorly compared to last week this actually does appear to be infected. Last week I really did not think that was the case but this seems like it may indeed be the case. Fortunately I do not see any signs of active infection locally or systemically at this time. 04-24-2023 upon evaluation today patient appears to be doing well currently in regard to his leg compared to where we were previous I do feel like that the antibiotics have been of benefit. Fortunately I do not see any signs of active infection at this time which is great news and in general I believe that we are making headway towards healing which is great news as well. With that being said he still has a lot of drainage I really feel like something like Anola Gurney would be better for him than just an ABD pad it would likely the drainage more or even Zetuvit's but I am not sure whether we can get either 1 of these at the facility for him. Electronic Signature(s) Chad [name], Chad [name] ([id number]) [id number] Page 6 of 16 Signed: 04/24/2023 12:08:48 PM By: [name] PA-C Entered By: [name] on 04/24/2023 09:08:48 -------------------------------------------------------------------------------- Physical Exam Details Patient Name: Date of Service: Chad [name], Chad [name] 04/24/2023 10:45 A M Medical Record Number: [id number] Patient Account Number: [id number] Date of Birth/Sex: Treating RN: [birth year] (62 y.o. [name] Primary Care  Provider: [name] Other Clinician: Referring Provider: Treating Provider/Extender: [name] Weeks in Treatment: 4 Constitutional Well-nourished and well-hydrated in no acute distress. Respiratory normal breathing without difficulty. Psychiatric this patient is able to make decisions and demonstrates good insight into disease process. Alert and Oriented x 3. pleasant and cooperative. Notes Upon inspection patient's wound bed actually showed signs of good granulation and epithelization at this point. Fortunately I do not see worsening overall and I do believe that the patient is making good headway towards complete closure which is great news. Electronic Signature(s) Signed: 04/24/2023 12:09:17 PM By: [name] PA-C Entered By: [name] on 04/24/2023 09:09:17 -------------------------------------------------------------------------------- Physician Orders Details Patient Name: Date of Service: Chad [name], Chad [name] 04/24/2023 10:45 A M Medical Record Number: [id number] Patient Account Number: [id number] Date of Birth/Sex: Treating RN: 02-16-1961 (62 y.o. [name] Primary Care Provider: [name] Other Clinician: Referring Provider: Treating Provider/Extender: [name] Weeks in Treatment: 65 Verbal / Phone Orders: No Diagnosis Coding ICD-10 Coding Code Description [phone] Chronic venous hypertension (idiopathic) with ulcer and  or use excessive force. Pat dry using gauze sponges, not tissue or cotton balls. Prim Dressing: Silvercel Small 2x2 (in/in) 1 x Per Day/30 Days ary Discharge Instructions: Apply Silvercel Small 2x2 (in/in) as instructed Chad [name], Chad [name] ([id number]) [phone].pdf Page 8 of 16 Secondary Dressing: Zetuvit Plus 4x8 (in/in) 1 x Per Day/30 Days Secured With: Kerlix Roll Sterile or Non-Sterile 6-ply 4.5x4 (yd/yd) 1 x Per Day/30 Days Discharge Instructions: Apply Kerlix as directed Secured With: Tubigrip Size D, 3x10 (in/yd) 1 x Per Day/30 Days Wound #22 - Lower Leg Wound Laterality: Right,  Anterior Cleanser: Soap and Water 1 x Per Day/30 Days Discharge Instructions: Gently cleanse wound with antibacterial soap, rinse and pat dry prior to dressing wounds Cleanser: Wound Cleanser 1 x Per Day/30 Days Discharge Instructions: Wash your hands with soap and water. Remove old dressing, discard into plastic bag and place into trash. Cleanse the wound with Wound Cleanser prior to applying a clean dressing using gauze sponges, not tissues or cotton balls. Do not scrub or use excessive force. Pat dry using gauze sponges, not tissue or cotton balls. Prim Dressing: Silvercel Small 2x2 (in/in) 1 x Per Day/30 Days ary Discharge Instructions: Apply Silvercel Small 2x2 (in/in) as instructed Secondary Dressing: Zetuvit Plus 4x8 (in/in) 1 x Per Day/30 Days Secured With: Kerlix Roll Sterile or Non-Sterile 6-ply 4.5x4 (yd/yd) 1 x Per Day/30 Days Discharge Instructions: Apply Kerlix as directed Secured With: Tubigrip Size D, 3x10 (in/yd) 1 x Per Day/30 Days Electronic Signature(s) Signed: 04/24/2023 12:30:42 PM By: Midge Aver MSN RN CNS WTA Signed: 04/24/2023 2:21:22 PM By: [name] PA-C Entered By: Midge Aver on 04/24/2023 08:57:36 -------------------------------------------------------------------------------- Problem List Details Patient Name: Date of Service: Chad [name] 04/24/2023 10:45 A M Medical Record Number: [id number] Patient Account Number: [id number] Date of Birth/Sex: Treating RN: Dec 03, 1960 (63 y.o. 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 02/19/2023 No Yes Chad [name], Chad [name] ([id number]) [id number] Page 9 of 16 foot 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, stage 3 10/07/2022 No Yes Inactive Problems ICD-10 Code Description Active Date Inactive Date L97.322 Non-pressure chronic ulcer of left ankle with fat layer exposed 12/06/2021 12/06/2021 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 [name] Primary Care Provider: [name] Other Clinician: Referring Provider: Treating Provider/Extender: [name] Weeks in Treatment: 72 Subjective Chief Complaint Information obtained from Patient Left ankle ulcer, Left dorsal foot wound, right heel History of Present Illness (HPI) 10/08/18 on evaluation today patient actually presents to our office for initial evaluation concerning wounds that he has of the bilateral lower extremities. 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 No doubt this has a lot to do with the discomfort in the right leg he describes when he is in bed. They have been using Xeroform on the wounds on the right. He has a lot of swelling in this leg with weeping edema 04-09-2023 upon evaluation today patient appears to be doing poorly still in regard to his right leg. 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  Fortunately I do not see any signs of active infection locally or systemically at this time which is great news. No fevers, chills, nausea, vomiting, or diarrhea. With that being said I am concerned still about the fact that he seems to be getting worse especially in regard to the heels I think that he is still sitting up most of the day which I think is still causing some swelling of his leg unfortunately. 04-16-2023 upon evaluation today patient actually appears to be doing little bit more poorly compared to last week this actually does appear to be infected. Last week I really did not think that was the case but this seems like it may indeed be the case. Fortunately I do not see any signs of active infection locally or systemically at this time. 04-24-2023 upon evaluation today patient appears to be doing well currently in regard to his leg compared to where we were previous I do feel like that the antibiotics have been of benefit. Fortunately I do not see any signs of active infection at this time which is great news and in general I believe that we are making headway towards healing which is great news as well. With that being said he still has a lot of drainage I really feel like something like Anola Gurney would be better for him than just an ABD pad it would likely the drainage more or even Zetuvit's but I am not sure whether we can get either 1 of these at the facility for him. Electronic Signature(s) Chad [name], Chad [name] ([id number]) [id number] Page 6 of 16 Signed: 04/24/2023 12:08:48 PM By: [name] PA-C Entered By: [name] on 04/24/2023 09:08:48 -------------------------------------------------------------------------------- Physical Exam Details Patient Name: Date of Service: Chad [name], Chad [name] 04/24/2023 10:45 A M Medical Record Number: [id number] Patient Account Number: [id number] Date of Birth/Sex: Treating RN: [birth year] (62 y.o. [name] Primary Care  Provider: [name] Other Clinician: Referring Provider: Treating Provider/Extender: [name] Weeks in Treatment: 4 Constitutional Well-nourished and well-hydrated in no acute distress. Respiratory normal breathing without difficulty. Psychiatric this patient is able to make decisions and demonstrates good insight into disease process. Alert and Oriented x 3. pleasant and cooperative. Notes Upon inspection patient's wound bed actually showed signs of good granulation and epithelization at this point. Fortunately I do not see worsening overall and I do believe that the patient is making good headway towards complete closure which is great news. Electronic Signature(s) Signed: 04/24/2023 12:09:17 PM By: [name] PA-C Entered By: [name] on 04/24/2023 09:09:17 -------------------------------------------------------------------------------- Physician Orders Details Patient Name: Date of Service: Chad [name], Chad [name] 04/24/2023 10:45 A M Medical Record Number: [id number] Patient Account Number: [id number] Date of Birth/Sex: Treating RN: 02-16-1961 (62 y.o. 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 No doubt this has a lot to do with the discomfort in the right leg he describes when he is in bed. They have been using Xeroform on the wounds on the right. He has a lot of swelling in this leg with weeping edema 04-09-2023 upon evaluation today patient appears to be doing poorly still in regard to his right leg. 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  Fortunately I do not see any signs of active infection locally or systemically at this time which is great news. No fevers, chills, nausea, vomiting, or diarrhea. With that being said I am concerned still about the fact that he seems to be getting worse especially in regard to the heels I think that he is still sitting up most of the day which I think is still causing some swelling of his leg unfortunately. 04-16-2023 upon evaluation today patient actually appears to be doing little bit more poorly compared to last week this actually does appear to be infected. Last week I really did not think that was the case but this seems like it may indeed be the case. Fortunately I do not see any signs of active infection locally or systemically at this time. 04-24-2023 upon evaluation today patient appears to be doing well currently in regard to his leg compared to where we were previous I do feel like that the antibiotics have been of benefit. Fortunately I do not see any signs of active infection at this time which is great news and in general I believe that we are making headway towards healing which is great news as well. With that being said he still has a lot of drainage I really feel like something like Anola Gurney would be better for him than just an ABD pad it would likely the drainage more or even Zetuvit's but I am not sure whether we can get either 1 of these at the facility for him. Electronic Signature(s) Chad [name], Chad [name] ([id number]) [id number] Page 6 of 16 Signed: 04/24/2023 12:08:48 PM By: [name] PA-C Entered By: [name] on 04/24/2023 09:08:48 -------------------------------------------------------------------------------- Physical Exam Details Patient Name: Date of Service: Chad [name], Chad [name] 04/24/2023 10:45 A M Medical Record Number: [id number] Patient Account Number: [id number] Date of Birth/Sex: Treating RN: [birth year] (62 y.o. [name] Primary Care  Provider: [name] Other Clinician: Referring Provider: Treating Provider/Extender: [name] Weeks in Treatment: 4 Constitutional Well-nourished and well-hydrated in no acute distress. Respiratory normal breathing without difficulty. Psychiatric this patient is able to make decisions and demonstrates good insight into disease process. Alert and Oriented x 3. pleasant and cooperative. Notes Upon inspection patient's wound bed actually showed signs of good granulation and epithelization at this point. Fortunately I do not see worsening overall and I do believe that the patient is making good headway towards complete closure which is great news. Electronic Signature(s) Signed: 04/24/2023 12:09:17 PM By: [name] PA-C Entered By: [name] on 04/24/2023 09:09:17 -------------------------------------------------------------------------------- Physician Orders Details Patient Name: Date of Service: Chad [name], Chad [name] 04/24/2023 10:45 A M Medical Record Number: [id number] Patient Account Number: [id number] Date of Birth/Sex: Treating RN: 02-16-1961 (62 y.o. 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 No doubt this has a lot to do with the discomfort in the right leg he describes when he is in bed. They have been using Xeroform on the wounds on the right. He has a lot of swelling in this leg with weeping edema 04-09-2023 upon evaluation today patient appears to be doing poorly still in regard to his right leg. 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  Fortunately I do not see any signs of active infection locally or systemically at this time which is great news. No fevers, chills, nausea, vomiting, or diarrhea. With that being said I am concerned still about the fact that he seems to be getting worse especially in regard to the heels I think that he is still sitting up most of the day which I think is still causing some swelling of his leg unfortunately. 04-16-2023 upon evaluation today patient actually appears to be doing little bit more poorly compared to last week this actually does appear to be infected. Last week I really did not think that was the case but this seems like it may indeed be the case. Fortunately I do not see any signs of active infection locally or systemically at this time. 04-24-2023 upon evaluation today patient appears to be doing well currently in regard to his leg compared to where we were previous I do feel like that the antibiotics have been of benefit. Fortunately I do not see any signs of active infection at this time which is great news and in general I believe that we are making headway towards healing which is great news as well. With that being said he still has a lot of drainage I really feel like something like Anola Gurney would be better for him than just an ABD pad it would likely the drainage more or even Zetuvit's but I am not sure whether we can get either 1 of these at the facility for him. Electronic Signature(s) Chad [name], Chad [name] ([id number]) [id number] Page 6 of 16 Signed: 04/24/2023 12:08:48 PM By: [name] PA-C Entered By: [name] on 04/24/2023 09:08:48 -------------------------------------------------------------------------------- Physical Exam Details Patient Name: Date of Service: Chad [name], Chad [name] 04/24/2023 10:45 A M Medical Record Number: [id number] Patient Account Number: [id number] Date of Birth/Sex: Treating RN: [birth year] (62 y.o. [name] Primary Care  Provider: [name] Other Clinician: Referring Provider: Treating Provider/Extender: [name] Weeks in Treatment: 4 Constitutional Well-nourished and well-hydrated in no acute distress. Respiratory normal breathing without difficulty. Psychiatric this patient is able to make decisions and demonstrates good insight into disease process. Alert and Oriented x 3. pleasant and cooperative. Notes Upon inspection patient's wound bed actually showed signs of good granulation and epithelization at this point. Fortunately I do not see worsening overall and I do believe that the patient is making good headway towards complete closure which is great news. Electronic Signature(s) Signed: 04/24/2023 12:09:17 PM By: [name] PA-C Entered By: [name] on 04/24/2023 09:09:17 -------------------------------------------------------------------------------- Physician Orders Details Patient Name: Date of Service: Chad [name], Chad [name] 04/24/2023 10:45 A M Medical Record Number: [id number] Patient Account Number: [id number] Date of Birth/Sex: Treating RN: 02-16-1961 (62 y.o. [name] Primary Care Provider: [name] Other Clinician: Referring Provider: Treating Provider/Extender: [name] Weeks in Treatment: 65 Verbal / Phone Orders: No Diagnosis Coding ICD-10 Coding Code Description [phone] Chronic venous hypertension (idiopathic) with ulcer and  dry prior to dressing wounds Cleanser: Wound Cleanser 1 x Per Day/30 Days Discharge Instructions: Wash your hands with soap and water. Remove old dressing, discard into plastic bag and place into trash. Cleanse the wound with Wound Cleanser prior to applying a clean dressing using gauze sponges, not tissues or cotton balls. Do not scrub or use excessive force. Pat dry using gauze sponges, not tissue or cotton balls. Prim Dressing: Silvercel Small 2x2 (in/in) 1 x Per Day/30 Days ary Discharge Instructions: Apply Silvercel Small 2x2 (in/in) as instructed Secondary Dressing: Zetuvit Plus 4x8 (in/in) 1 x Per Day/30 Days Secured With: Kerlix Roll Sterile or Non-Sterile 6-ply 4.5x4 (yd/yd) 1 x Per Day/30 Days Discharge Instructions: Apply Kerlix as directed Secured With: Tubigrip Size D, 3x10 (in/yd) 1 x Per Day/30 Days WOUND #22: - Lower Leg Wound Laterality: Right, Anterior Cleanser: Soap and Water 1 x Per Day/30 Days Discharge Instructions: Gently cleanse wound with antibacterial soap, rinse and pat dry prior to dressing wounds Cleanser: Wound Cleanser 1 x Per Day/30 Days Discharge Instructions: Wash your hands with soap and water. Remove old dressing, discard into plastic bag and place into trash. Cleanse the wound with Wound Cleanser prior to applying a clean dressing using gauze sponges, not tissues or cotton balls. Do not scrub or use excessive force. Pat dry using gauze sponges, not tissue or cotton balls. Prim Dressing: Silvercel Small 2x2 (in/in) 1 x Per Day/30 Days ary Discharge  Instructions: Apply Silvercel Small 2x2 (in/in) as instructed Secondary Dressing: Zetuvit Plus 4x8 (in/in) 1 x Per Day/30 Days Secured With: Kerlix Roll Sterile or Non-Sterile 6-ply 4.5x4 (yd/yd) 1 x Per Day/30 Days Discharge Instructions: Apply Kerlix as directed Secured With: Tubigrip Size D, 3x10 (in/yd) 1 x Per Day/30 Days 1. I am going to recommend that we have the patient continue to monitor for any evidence of worsening. His dressings do need to be changed daily. 2. I am going to recommend XtraSorb as that option here for him in order to keep the drainage off of his skin and prevent any additional maceration. 3. I am going to recommend as well that the patient should continue to monitor for any signs of infection or worsening if anything changes he knows to let me know and the facility should be monitoring as well. We will see patient back for reevaluation in 1 week here in the clinic. If anything worsens or changes patient will contact our office for additional recommendations. Electronic Signature(s) Signed: 04/24/2023 12:10:27 PM By: [name] PA-C Entered By: [name] on 04/24/2023 09:10:27 -------------------------------------------------------------------------------- SuperBill Details Patient Name: Date of Service: Chad [name], Chad [name] 04/24/2023 Medical Record Number: [id number] Patient Account Number: [id number] Date of Birth/Sex: Treating RN: [birth year] (62 y.o. [name] Primary Care Provider: [name] Other Clinician: Referring Provider: Treating Provider/Extender: [name] Weeks in Treatment: [address] 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 No doubt this has a lot to do with the discomfort in the right leg he describes when he is in bed. They have been using Xeroform on the wounds on the right. He has a lot of swelling in this leg with weeping edema 04-09-2023 upon evaluation today patient appears to be doing poorly still in regard to his right leg. 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  Fortunately I do not see any signs of active infection locally or systemically at this time which is great news. No fevers, chills, nausea, vomiting, or diarrhea. With that being said I am concerned still about the fact that he seems to be getting worse especially in regard to the heels I think that he is still sitting up most of the day which I think is still causing some swelling of his leg unfortunately. 04-16-2023 upon evaluation today patient actually appears to be doing little bit more poorly compared to last week this actually does appear to be infected. Last week I really did not think that was the case but this seems like it may indeed be the case. Fortunately I do not see any signs of active infection locally or systemically at this time. 04-24-2023 upon evaluation today patient appears to be doing well currently in regard to his leg compared to where we were previous I do feel like that the antibiotics have been of benefit. Fortunately I do not see any signs of active infection at this time which is great news and in general I believe that we are making headway towards healing which is great news as well. With that being said he still has a lot of drainage I really feel like something like Anola Gurney would be better for him than just an ABD pad it would likely the drainage more or even Zetuvit's but I am not sure whether we can get either 1 of these at the facility for him. Electronic Signature(s) Chad [name], Chad [name] ([id number]) [id number] Page 6 of 16 Signed: 04/24/2023 12:08:48 PM By: [name] PA-C Entered By: [name] on 04/24/2023 09:08:48 -------------------------------------------------------------------------------- Physical Exam Details Patient Name: Date of Service: Chad [name], Chad [name] 04/24/2023 10:45 A M Medical Record Number: [id number] Patient Account Number: [id number] Date of Birth/Sex: Treating RN: [birth year] (62 y.o. [name] Primary Care  Provider: [name] Other Clinician: Referring Provider: Treating Provider/Extender: [name] Weeks in Treatment: 4 Constitutional Well-nourished and well-hydrated in no acute distress. Respiratory normal breathing without difficulty. Psychiatric this patient is able to make decisions and demonstrates good insight into disease process. Alert and Oriented x 3. pleasant and cooperative. Notes Upon inspection patient's wound bed actually showed signs of good granulation and epithelization at this point. Fortunately I do not see worsening overall and I do believe that the patient is making good headway towards complete closure which is great news. Electronic Signature(s) Signed: 04/24/2023 12:09:17 PM By: [name] PA-C Entered By: [name] on 04/24/2023 09:09:17 -------------------------------------------------------------------------------- Physician Orders Details Patient Name: Date of Service: Chad [name], Chad [name] 04/24/2023 10:45 A M Medical Record Number: [id number] Patient Account Number: [id number] Date of Birth/Sex: Treating RN: 02-16-1961 (62 y.o. [name] Primary Care Provider: [name] Other Clinician: Referring Provider: Treating Provider/Extender: [name] Weeks in Treatment: 65 Verbal / Phone Orders: No Diagnosis Coding ICD-10 Coding Code Description [phone] Chronic venous hypertension (idiopathic) with ulcer and  dry prior to dressing wounds Cleanser: Wound Cleanser 1 x Per Day/30 Days Discharge Instructions: Wash your hands with soap and water. Remove old dressing, discard into plastic bag and place into trash. Cleanse the wound with Wound Cleanser prior to applying a clean dressing using gauze sponges, not tissues or cotton balls. Do not scrub or use excessive force. Pat dry using gauze sponges, not tissue or cotton balls. Prim Dressing: Silvercel Small 2x2 (in/in) 1 x Per Day/30 Days ary Discharge Instructions: Apply Silvercel Small 2x2 (in/in) as instructed Secondary Dressing: Zetuvit Plus 4x8 (in/in) 1 x Per Day/30 Days Secured With: Kerlix Roll Sterile or Non-Sterile 6-ply 4.5x4 (yd/yd) 1 x Per Day/30 Days Discharge Instructions: Apply Kerlix as directed Secured With: Tubigrip Size D, 3x10 (in/yd) 1 x Per Day/30 Days WOUND #22: - Lower Leg Wound Laterality: Right, Anterior Cleanser: Soap and Water 1 x Per Day/30 Days Discharge Instructions: Gently cleanse wound with antibacterial soap, rinse and pat dry prior to dressing wounds Cleanser: Wound Cleanser 1 x Per Day/30 Days Discharge Instructions: Wash your hands with soap and water. Remove old dressing, discard into plastic bag and place into trash. Cleanse the wound with Wound Cleanser prior to applying a clean dressing using gauze sponges, not tissues or cotton balls. Do not scrub or use excessive force. Pat dry using gauze sponges, not tissue or cotton balls. Prim Dressing: Silvercel Small 2x2 (in/in) 1 x Per Day/30 Days ary Discharge  Instructions: Apply Silvercel Small 2x2 (in/in) as instructed Secondary Dressing: Zetuvit Plus 4x8 (in/in) 1 x Per Day/30 Days Secured With: Kerlix Roll Sterile or Non-Sterile 6-ply 4.5x4 (yd/yd) 1 x Per Day/30 Days Discharge Instructions: Apply Kerlix as directed Secured With: Tubigrip Size D, 3x10 (in/yd) 1 x Per Day/30 Days 1. I am going to recommend that we have the patient continue to monitor for any evidence of worsening. His dressings do need to be changed daily. 2. I am going to recommend XtraSorb as that option here for him in order to keep the drainage off of his skin and prevent any additional maceration. 3. I am going to recommend as well that the patient should continue to monitor for any signs of infection or worsening if anything changes he knows to let me know and the facility should be monitoring as well. We will see patient back for reevaluation in 1 week here in the clinic. If anything worsens or changes patient will contact our office for additional recommendations. Electronic Signature(s) Signed: 04/24/2023 12:10:27 PM By: [name] PA-C Entered By: [name] on 04/24/2023 09:10:27 -------------------------------------------------------------------------------- SuperBill Details Patient Name: Date of Service: Chad [name], Chad [name] 04/24/2023 Medical Record Number: [id number] Patient Account Number: [id number] Date of Birth/Sex: Treating RN: [birth year] (62 y.o. [name] Primary Care Provider: [name] Other Clinician: Referring Provider: Treating Provider/Extender: [name] Weeks in Treatment: [address] 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 Pat dry using gauze sponges, not tissue or cotton balls. Prim Dressing: Silvercel Small 2x2 (in/in) 1 x Per Day/30 Days ary Discharge Instructions: Apply Silvercel Small 2x2 (in/in) as instructed Chad [name], Chad [name] ([id number]) [phone].pdf Page 8 of 16 Secondary Dressing: Zetuvit Plus 4x8 (in/in) 1 x Per Day/30 Days Secured With: Kerlix Roll Sterile or Non-Sterile 6-ply 4.5x4 (yd/yd) 1 x Per Day/30 Days Discharge Instructions: Apply Kerlix as directed Secured With: Tubigrip Size D, 3x10 (in/yd) 1 x Per Day/30 Days Wound #22 - Lower Leg Wound Laterality: Right,  Anterior Cleanser: Soap and Water 1 x Per Day/30 Days Discharge Instructions: Gently cleanse wound with antibacterial soap, rinse and pat dry prior to dressing wounds Cleanser: Wound Cleanser 1 x Per Day/30 Days Discharge Instructions: Wash your hands with soap and water. Remove old dressing, discard into plastic bag and place into trash. Cleanse the wound with Wound Cleanser prior to applying a clean dressing using gauze sponges, not tissues or cotton balls. Do not scrub or use excessive force. Pat dry using gauze sponges, not tissue or cotton balls. Prim Dressing: Silvercel Small 2x2 (in/in) 1 x Per Day/30 Days ary Discharge Instructions: Apply Silvercel Small 2x2 (in/in) as instructed Secondary Dressing: Zetuvit Plus 4x8 (in/in) 1 x Per Day/30 Days Secured With: Kerlix Roll Sterile or Non-Sterile 6-ply 4.5x4 (yd/yd) 1 x Per Day/30 Days Discharge Instructions: Apply Kerlix as directed Secured With: Tubigrip Size D, 3x10 (in/yd) 1 x Per Day/30 Days Electronic Signature(s) Signed: 04/24/2023 12:30:42 PM By: Midge Aver MSN RN CNS WTA Signed: 04/24/2023 2:21:22 PM By: [name] PA-C Entered By: Midge Aver on 04/24/2023 08:57:36 -------------------------------------------------------------------------------- Problem List Details Patient Name: Date of Service: Chad [name] 04/24/2023 10:45 A M Medical Record Number: [id number] Patient Account Number: [id number] Date of Birth/Sex: Treating RN: Dec 03, 1960 (63 y.o. 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 02/19/2023 No Yes Chad [name], Chad [name] ([id number]) [id number] Page 9 of 16 foot 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, stage 3 10/07/2022 No Yes Inactive Problems ICD-10 Code Description Active Date Inactive Date L97.322 Non-pressure chronic ulcer of left ankle with fat layer exposed 12/06/2021 12/06/2021 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 [name] Primary Care Provider: [name] Other Clinician: Referring Provider: Treating Provider/Extender: [name] Weeks in Treatment: 72 Subjective Chief Complaint Information obtained from Patient Left ankle ulcer, Left dorsal foot wound, right heel History of Present Illness (HPI) 10/08/18 on evaluation today patient actually presents to our office for initial evaluation concerning wounds that he has of the bilateral lower extremities. He has no history of known diabetes, he does have hepatitis C, urinary tract cancer for which  or use excessive force. Pat dry using gauze sponges, not tissue or cotton balls. Prim Dressing: Silvercel Small 2x2 (in/in) 1 x Per Day/30 Days ary Discharge Instructions: Apply Silvercel Small 2x2 (in/in) as instructed Chad [name], Chad [name] ([id number]) [phone].pdf Page 8 of 16 Secondary Dressing: Zetuvit Plus 4x8 (in/in) 1 x Per Day/30 Days Secured With: Kerlix Roll Sterile or Non-Sterile 6-ply 4.5x4 (yd/yd) 1 x Per Day/30 Days Discharge Instructions: Apply Kerlix as directed Secured With: Tubigrip Size D, 3x10 (in/yd) 1 x Per Day/30 Days Wound #22 - Lower Leg Wound Laterality: Right,  Anterior Cleanser: Soap and Water 1 x Per Day/30 Days Discharge Instructions: Gently cleanse wound with antibacterial soap, rinse and pat dry prior to dressing wounds Cleanser: Wound Cleanser 1 x Per Day/30 Days Discharge Instructions: Wash your hands with soap and water. Remove old dressing, discard into plastic bag and place into trash. Cleanse the wound with Wound Cleanser prior to applying a clean dressing using gauze sponges, not tissues or cotton balls. Do not scrub or use excessive force. Pat dry using gauze sponges, not tissue or cotton balls. Prim Dressing: Silvercel Small 2x2 (in/in) 1 x Per Day/30 Days ary Discharge Instructions: Apply Silvercel Small 2x2 (in/in) as instructed Secondary Dressing: Zetuvit Plus 4x8 (in/in) 1 x Per Day/30 Days Secured With: Kerlix Roll Sterile or Non-Sterile 6-ply 4.5x4 (yd/yd) 1 x Per Day/30 Days Discharge Instructions: Apply Kerlix as directed Secured With: Tubigrip Size D, 3x10 (in/yd) 1 x Per Day/30 Days Electronic Signature(s) Signed: 04/24/2023 12:30:42 PM By: Midge Aver MSN RN CNS WTA Signed: 04/24/2023 2:21:22 PM By: [name] PA-C Entered By: Midge Aver on 04/24/2023 08:57:36 -------------------------------------------------------------------------------- Problem List Details Patient Name: Date of Service: Chad [name] 04/24/2023 10:45 A M Medical Record Number: [id number] Patient Account Number: [id number] Date of Birth/Sex: Treating RN: Dec 03, 1960 (63 y.o. 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 02/19/2023 No Yes Chad [name], Chad [name] ([id number]) [id number] Page 9 of 16 foot 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, stage 3 10/07/2022 No Yes Inactive Problems ICD-10 Code Description Active Date Inactive Date L97.322 Non-pressure chronic ulcer of left ankle with fat layer exposed 12/06/2021 12/06/2021 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 [name] Primary Care Provider: [name] Other Clinician: Referring Provider: Treating Provider/Extender: [name] Weeks in Treatment: 72 Subjective Chief Complaint Information obtained from Patient Left ankle ulcer, Left dorsal foot wound, right heel History of Present Illness (HPI) 10/08/18 on evaluation today patient actually presents to our office for initial evaluation concerning wounds that he has of the bilateral lower extremities. He has no history of known diabetes, he does have hepatitis C, urinary tract cancer for which  or use excessive force. Pat dry using gauze sponges, not tissue or cotton balls. Prim Dressing: Silvercel Small 2x2 (in/in) 1 x Per Day/30 Days ary Discharge Instructions: Apply Silvercel Small 2x2 (in/in) as instructed Chad [name], Chad [name] ([id number]) [phone].pdf Page 8 of 16 Secondary Dressing: Zetuvit Plus 4x8 (in/in) 1 x Per Day/30 Days Secured With: Kerlix Roll Sterile or Non-Sterile 6-ply 4.5x4 (yd/yd) 1 x Per Day/30 Days Discharge Instructions: Apply Kerlix as directed Secured With: Tubigrip Size D, 3x10 (in/yd) 1 x Per Day/30 Days Wound #22 - Lower Leg Wound Laterality: Right,  Anterior Cleanser: Soap and Water 1 x Per Day/30 Days Discharge Instructions: Gently cleanse wound with antibacterial soap, rinse and pat dry prior to dressing wounds Cleanser: Wound Cleanser 1 x Per Day/30 Days Discharge Instructions: Wash your hands with soap and water. Remove old dressing, discard into plastic bag and place into trash. Cleanse the wound with Wound Cleanser prior to applying a clean dressing using gauze sponges, not tissues or cotton balls. Do not scrub or use excessive force. Pat dry using gauze sponges, not tissue or cotton balls. Prim Dressing: Silvercel Small 2x2 (in/in) 1 x Per Day/30 Days ary Discharge Instructions: Apply Silvercel Small 2x2 (in/in) as instructed Secondary Dressing: Zetuvit Plus 4x8 (in/in) 1 x Per Day/30 Days Secured With: Kerlix Roll Sterile or Non-Sterile 6-ply 4.5x4 (yd/yd) 1 x Per Day/30 Days Discharge Instructions: Apply Kerlix as directed Secured With: Tubigrip Size D, 3x10 (in/yd) 1 x Per Day/30 Days Electronic Signature(s) Signed: 04/24/2023 12:30:42 PM By: Midge Aver MSN RN CNS WTA Signed: 04/24/2023 2:21:22 PM By: [name] PA-C Entered By: Midge Aver on 04/24/2023 08:57:36 -------------------------------------------------------------------------------- Problem List Details Patient Name: Date of Service: Chad [name] 04/24/2023 10:45 A M Medical Record Number: [id number] Patient Account Number: [id number] Date of Birth/Sex: Treating RN: Dec 03, 1960 (63 y.o. 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 02/19/2023 No Yes Chad [name], Chad [name] ([id number]) [id number] Page 9 of 16 foot 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, stage 3 10/07/2022 No Yes Inactive Problems ICD-10 Code Description Active Date Inactive Date L97.322 Non-pressure chronic ulcer of left ankle with fat layer exposed 12/06/2021 12/06/2021 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 [name] Primary Care Provider: [name] Other Clinician: Referring Provider: Treating Provider/Extender: [name] Weeks in Treatment: 72 Subjective Chief Complaint Information obtained from Patient Left ankle ulcer, Left dorsal foot wound, right heel History of Present Illness (HPI) 10/08/18 on evaluation today patient actually presents to our office for initial evaluation concerning wounds that he has of the bilateral lower extremities. He has no history of known diabetes, he does have hepatitis C, urinary tract cancer for which  Chad [name], Chad [name] ([id number]) [id number] Page 1 of 16 Visit Report for 04/24/2023 Chief Complaint Document Details Patient Name: Date of Service: Chad [name], Chad [name] 04/24/2023 10:45 A M Medical Record Number: [id number] Patient Account Number: [id number] Date of Birth/Sex: Treating RN: 05/20/1961 (62 y.o. [name] Primary Care Provider: [name] Other Clinician: Referring Provider: Treating Provider/Extender: [name] Weeks in Treatment: 72 Information Obtained from: Patient Chief Complaint Left ankle ulcer, Left dorsal foot wound, right heel Electronic Signature(s) Signed: 04/24/2023 10:30:42 AM By: [name] PA-C Entered By: [name] on 04/24/2023 07:30:41 -------------------------------------------------------------------------------- HPI Details Patient Name: Date of Service: Chad [name] 04/24/2023 10:45 A M Medical Record Number: [id number] Patient Account Number: [id number] Date of Birth/Sex: Treating RN: [birth year] (62 y.o. [name] Primary Care Provider: [name] Other Clinician: Referring Provider: Treating Provider/Extender: [name] Weeks in Treatment: 72 History of Present Illness HPI Description: 10/08/18 on evaluation today patient actually presents to our office for initial evaluation concerning wounds that he has of the bilateral lower extremities. 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 He also has bilateral venous stasis. He apparently has been homeless currently following discharge from the hospital apparently he has been placed at [facility] which is is a skilled nursing facility locally. Nonetheless fortunately he does not show any signs of infection at this time which is good news. In fact several of the wound  actually appears to be showing some signs of improvement already in my pinion. There are a couple areas in the left leg in particular there likely gonna require some sharp debridement to help clear away some necrotic tissue and help with more sufficient healing. No fevers, chills, nausea, or vomiting noted at this time. 10/15/18 on evaluation today patient actually appears to be doing very well in regard to his bilateral lower extremities. He's been tolerating the dressing changes without complication. Fortunately there does not appear to be any evidence of active infection at this time which is great news. Overall I'm actually very pleased with how this has progressed in just one visits time. Readmission: 08/14/2020 upon evaluation today patient presents for re-evaluation here in our clinic. He is having issues with his left ankle region as well as his right toe and his right heel. He tells me that the toe and heel actually began as a area that was itching that he was scratching and then subsequently opened up into wounds. These may have been abscess areas I presume based on what I am seeing currently. With regard to his left ankle region he tells me this was a similar type Chad [name], Chad [name] ([id number]) [id number] Page 2 of 16 occurrence although he does have venous stasis this very well may be more of a venous leg ulcer more than anything. Nonetheless I do believe that the patient would benefit from appropriate and aggressive wound care to try to help get things under better control here. He does have history of a stroke on the left side affecting him to some degree there that he is able to stand although he does have some residual weakness. Otherwise again the patient does have chronic venous insufficiency as previously noted. 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 No doubt this has a lot to do with the discomfort in the right leg he describes when he is in bed. They have been using Xeroform on the wounds on the right. He has a lot of swelling in this leg with weeping edema 04-09-2023 upon evaluation today patient appears to be doing poorly still in regard to his right leg. 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  Fortunately I do not see any signs of active infection locally or systemically at this time which is great news. No fevers, chills, nausea, vomiting, or diarrhea. With that being said I am concerned still about the fact that he seems to be getting worse especially in regard to the heels I think that he is still sitting up most of the day which I think is still causing some swelling of his leg unfortunately. 04-16-2023 upon evaluation today patient actually appears to be doing little bit more poorly compared to last week this actually does appear to be infected. Last week I really did not think that was the case but this seems like it may indeed be the case. Fortunately I do not see any signs of active infection locally or systemically at this time. 04-24-2023 upon evaluation today patient appears to be doing well currently in regard to his leg compared to where we were previous I do feel like that the antibiotics have been of benefit. Fortunately I do not see any signs of active infection at this time which is great news and in general I believe that we are making headway towards healing which is great news as well. With that being said he still has a lot of drainage I really feel like something like Anola Gurney would be better for him than just an ABD pad it would likely the drainage more or even Zetuvit's but I am not sure whether we can get either 1 of these at the facility for him. Electronic Signature(s) Chad [name], Chad [name] ([id number]) [id number] Page 6 of 16 Signed: 04/24/2023 12:08:48 PM By: [name] PA-C Entered By: [name] on 04/24/2023 09:08:48 -------------------------------------------------------------------------------- Physical Exam Details Patient Name: Date of Service: Chad [name], Chad [name] 04/24/2023 10:45 A M Medical Record Number: [id number] Patient Account Number: [id number] Date of Birth/Sex: Treating RN: [birth year] (62 y.o. [name] Primary Care  Provider: [name] Other Clinician: Referring Provider: Treating Provider/Extender: [name] Weeks in Treatment: 4 Constitutional Well-nourished and well-hydrated in no acute distress. Respiratory normal breathing without difficulty. Psychiatric this patient is able to make decisions and demonstrates good insight into disease process. Alert and Oriented x 3. pleasant and cooperative. Notes Upon inspection patient's wound bed actually showed signs of good granulation and epithelization at this point. Fortunately I do not see worsening overall and I do believe that the patient is making good headway towards complete closure which is great news. Electronic Signature(s) Signed: 04/24/2023 12:09:17 PM By: [name] PA-C Entered By: [name] on 04/24/2023 09:09:17 -------------------------------------------------------------------------------- Physician Orders Details Patient Name: Date of Service: Chad [name], Chad [name] 04/24/2023 10:45 A M Medical Record Number: [id number] Patient Account Number: [id number] Date of Birth/Sex: Treating RN: 02-16-1961 (62 y.o. [name] Primary Care Provider: [name] Other Clinician: Referring Provider: Treating Provider/Extender: [name] Weeks in Treatment: 65 Verbal / Phone Orders: No Diagnosis Coding ICD-10 Coding Code Description [phone] Chronic venous hypertension (idiopathic) with ulcer and  dry prior to dressing wounds Cleanser: Wound Cleanser 1 x Per Day/30 Days Discharge Instructions: Wash your hands with soap and water. Remove old dressing, discard into plastic bag and place into trash. Cleanse the wound with Wound Cleanser prior to applying a clean dressing using gauze sponges, not tissues or cotton balls. Do not scrub or use excessive force. Pat dry using gauze sponges, not tissue or cotton balls. Prim Dressing: Silvercel Small 2x2 (in/in) 1 x Per Day/30 Days ary Discharge Instructions: Apply Silvercel Small 2x2 (in/in) as instructed Secondary Dressing: Zetuvit Plus 4x8 (in/in) 1 x Per Day/30 Days Secured With: Kerlix Roll Sterile or Non-Sterile 6-ply 4.5x4 (yd/yd) 1 x Per Day/30 Days Discharge Instructions: Apply Kerlix as directed Secured With: Tubigrip Size D, 3x10 (in/yd) 1 x Per Day/30 Days WOUND #22: - Lower Leg Wound Laterality: Right, Anterior Cleanser: Soap and Water 1 x Per Day/30 Days Discharge Instructions: Gently cleanse wound with antibacterial soap, rinse and pat dry prior to dressing wounds Cleanser: Wound Cleanser 1 x Per Day/30 Days Discharge Instructions: Wash your hands with soap and water. Remove old dressing, discard into plastic bag and place into trash. Cleanse the wound with Wound Cleanser prior to applying a clean dressing using gauze sponges, not tissues or cotton balls. Do not scrub or use excessive force. Pat dry using gauze sponges, not tissue or cotton balls. Prim Dressing: Silvercel Small 2x2 (in/in) 1 x Per Day/30 Days ary Discharge  Instructions: Apply Silvercel Small 2x2 (in/in) as instructed Secondary Dressing: Zetuvit Plus 4x8 (in/in) 1 x Per Day/30 Days Secured With: Kerlix Roll Sterile or Non-Sterile 6-ply 4.5x4 (yd/yd) 1 x Per Day/30 Days Discharge Instructions: Apply Kerlix as directed Secured With: Tubigrip Size D, 3x10 (in/yd) 1 x Per Day/30 Days 1. I am going to recommend that we have the patient continue to monitor for any evidence of worsening. His dressings do need to be changed daily. 2. I am going to recommend XtraSorb as that option here for him in order to keep the drainage off of his skin and prevent any additional maceration. 3. I am going to recommend as well that the patient should continue to monitor for any signs of infection or worsening if anything changes he knows to let me know and the facility should be monitoring as well. We will see patient back for reevaluation in 1 week here in the clinic. If anything worsens or changes patient will contact our office for additional recommendations. Electronic Signature(s) Signed: 04/24/2023 12:10:27 PM By: [name] PA-C Entered By: [name] on 04/24/2023 09:10:27 -------------------------------------------------------------------------------- SuperBill Details Patient Name: Date of Service: Chad [name], Chad [name] 04/24/2023 Medical Record Number: [id number] Patient Account Number: [id number] Date of Birth/Sex: Treating RN: [birth year] (62 y.o. [name] Primary Care Provider: [name] Other Clinician: Referring Provider: Treating Provider/Extender: [name] Weeks in Treatment: [address] 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 Pat dry using gauze sponges, not tissue or cotton balls. Prim Dressing: Silvercel Small 2x2 (in/in) 1 x Per Day/30 Days ary Discharge Instructions: Apply Silvercel Small 2x2 (in/in) as instructed Chad [name], Chad [name] ([id number]) [phone].pdf Page 8 of 16 Secondary Dressing: Zetuvit Plus 4x8 (in/in) 1 x Per Day/30 Days Secured With: Kerlix Roll Sterile or Non-Sterile 6-ply 4.5x4 (yd/yd) 1 x Per Day/30 Days Discharge Instructions: Apply Kerlix as directed Secured With: Tubigrip Size D, 3x10 (in/yd) 1 x Per Day/30 Days Wound #22 - Lower Leg Wound Laterality: Right,  Anterior Cleanser: Soap and Water 1 x Per Day/30 Days Discharge Instructions: Gently cleanse wound with antibacterial soap, rinse and pat dry prior to dressing wounds Cleanser: Wound Cleanser 1 x Per Day/30 Days Discharge Instructions: Wash your hands with soap and water. Remove old dressing, discard into plastic bag and place into trash. Cleanse the wound with Wound Cleanser prior to applying a clean dressing using gauze sponges, not tissues or cotton balls. Do not scrub or use excessive force. Pat dry using gauze sponges, not tissue or cotton balls. Prim Dressing: Silvercel Small 2x2 (in/in) 1 x Per Day/30 Days ary Discharge Instructions: Apply Silvercel Small 2x2 (in/in) as instructed Secondary Dressing: Zetuvit Plus 4x8 (in/in) 1 x Per Day/30 Days Secured With: Kerlix Roll Sterile or Non-Sterile 6-ply 4.5x4 (yd/yd) 1 x Per Day/30 Days Discharge Instructions: Apply Kerlix as directed Secured With: Tubigrip Size D, 3x10 (in/yd) 1 x Per Day/30 Days Electronic Signature(s) Signed: 04/24/2023 12:30:42 PM By: Midge Aver MSN RN CNS WTA Signed: 04/24/2023 2:21:22 PM By: [name] PA-C Entered By: Midge Aver on 04/24/2023 08:57:36 -------------------------------------------------------------------------------- Problem List Details Patient Name: Date of Service: Chad [name] 04/24/2023 10:45 A M Medical Record Number: [id number] Patient Account Number: [id number] Date of Birth/Sex: Treating RN: Dec 03, 1960 (63 y.o. 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 02/19/2023 No Yes Chad [name], Chad [name] ([id number]) [id number] Page 9 of 16 foot 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, stage 3 10/07/2022 No Yes Inactive Problems ICD-10 Code Description Active Date Inactive Date L97.322 Non-pressure chronic ulcer of left ankle with fat layer exposed 12/06/2021 12/06/2021 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 [name] Primary Care Provider: [name] Other Clinician: Referring Provider: Treating Provider/Extender: [name] Weeks in Treatment: 72 Subjective Chief Complaint Information obtained from Patient Left ankle ulcer, Left dorsal foot wound, right heel History of Present Illness (HPI) 10/08/18 on evaluation today patient actually presents to our office for initial evaluation concerning wounds that he has of the bilateral lower extremities. He has no history of known diabetes, he does have hepatitis C, urinary tract cancer for which  dry prior to dressing wounds Cleanser: Wound Cleanser 1 x Per Day/30 Days Discharge Instructions: Wash your hands with soap and water. Remove old dressing, discard into plastic bag and place into trash. Cleanse the wound with Wound Cleanser prior to applying a clean dressing using gauze sponges, not tissues or cotton balls. Do not scrub or use excessive force. Pat dry using gauze sponges, not tissue or cotton balls. Prim Dressing: Silvercel Small 2x2 (in/in) 1 x Per Day/30 Days ary Discharge Instructions: Apply Silvercel Small 2x2 (in/in) as instructed Secondary Dressing: Zetuvit Plus 4x8 (in/in) 1 x Per Day/30 Days Secured With: Kerlix Roll Sterile or Non-Sterile 6-ply 4.5x4 (yd/yd) 1 x Per Day/30 Days Discharge Instructions: Apply Kerlix as directed Secured With: Tubigrip Size D, 3x10 (in/yd) 1 x Per Day/30 Days WOUND #22: - Lower Leg Wound Laterality: Right, Anterior Cleanser: Soap and Water 1 x Per Day/30 Days Discharge Instructions: Gently cleanse wound with antibacterial soap, rinse and pat dry prior to dressing wounds Cleanser: Wound Cleanser 1 x Per Day/30 Days Discharge Instructions: Wash your hands with soap and water. Remove old dressing, discard into plastic bag and place into trash. Cleanse the wound with Wound Cleanser prior to applying a clean dressing using gauze sponges, not tissues or cotton balls. Do not scrub or use excessive force. Pat dry using gauze sponges, not tissue or cotton balls. Prim Dressing: Silvercel Small 2x2 (in/in) 1 x Per Day/30 Days ary Discharge  Instructions: Apply Silvercel Small 2x2 (in/in) as instructed Secondary Dressing: Zetuvit Plus 4x8 (in/in) 1 x Per Day/30 Days Secured With: Kerlix Roll Sterile or Non-Sterile 6-ply 4.5x4 (yd/yd) 1 x Per Day/30 Days Discharge Instructions: Apply Kerlix as directed Secured With: Tubigrip Size D, 3x10 (in/yd) 1 x Per Day/30 Days 1. I am going to recommend that we have the patient continue to monitor for any evidence of worsening. His dressings do need to be changed daily. 2. I am going to recommend XtraSorb as that option here for him in order to keep the drainage off of his skin and prevent any additional maceration. 3. I am going to recommend as well that the patient should continue to monitor for any signs of infection or worsening if anything changes he knows to let me know and the facility should be monitoring as well. We will see patient back for reevaluation in 1 week here in the clinic. If anything worsens or changes patient will contact our office for additional recommendations. Electronic Signature(s) Signed: 04/24/2023 12:10:27 PM By: [name] PA-C Entered By: [name] on 04/24/2023 09:10:27 -------------------------------------------------------------------------------- SuperBill Details Patient Name: Date of Service: Chad [name], Chad [name] 04/24/2023 Medical Record Number: [id number] Patient Account Number: [id number] Date of Birth/Sex: Treating RN: [birth year] (62 y.o. [name] Primary Care Provider: [name] Other Clinician: Referring Provider: Treating Provider/Extender: [name] Weeks in Treatment: [address] 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 No doubt this has a lot to do with the discomfort in the right leg he describes when he is in bed. They have been using Xeroform on the wounds on the right. He has a lot of swelling in this leg with weeping edema 04-09-2023 upon evaluation today patient appears to be doing poorly still in regard to his right leg. 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  Fortunately I do not see any signs of active infection locally or systemically at this time which is great news. No fevers, chills, nausea, vomiting, or diarrhea. With that being said I am concerned still about the fact that he seems to be getting worse especially in regard to the heels I think that he is still sitting up most of the day which I think is still causing some swelling of his leg unfortunately. 04-16-2023 upon evaluation today patient actually appears to be doing little bit more poorly compared to last week this actually does appear to be infected. Last week I really did not think that was the case but this seems like it may indeed be the case. Fortunately I do not see any signs of active infection locally or systemically at this time. 04-24-2023 upon evaluation today patient appears to be doing well currently in regard to his leg compared to where we were previous I do feel like that the antibiotics have been of benefit. Fortunately I do not see any signs of active infection at this time which is great news and in general I believe that we are making headway towards healing which is great news as well. With that being said he still has a lot of drainage I really feel like something like Anola Gurney would be better for him than just an ABD pad it would likely the drainage more or even Zetuvit's but I am not sure whether we can get either 1 of these at the facility for him. Electronic Signature(s) Chad [name], Chad [name] ([id number]) [id number] Page 6 of 16 Signed: 04/24/2023 12:08:48 PM By: [name] PA-C Entered By: [name] on 04/24/2023 09:08:48 -------------------------------------------------------------------------------- Physical Exam Details Patient Name: Date of Service: Chad [name], Chad [name] 04/24/2023 10:45 A M Medical Record Number: [id number] Patient Account Number: [id number] Date of Birth/Sex: Treating RN: [birth year] (62 y.o. [name] Primary Care  Provider: [name] Other Clinician: Referring Provider: Treating Provider/Extender: [name] Weeks in Treatment: 4 Constitutional Well-nourished and well-hydrated in no acute distress. Respiratory normal breathing without difficulty. Psychiatric this patient is able to make decisions and demonstrates good insight into disease process. Alert and Oriented x 3. pleasant and cooperative. Notes Upon inspection patient's wound bed actually showed signs of good granulation and epithelization at this point. Fortunately I do not see worsening overall and I do believe that the patient is making good headway towards complete closure which is great news. Electronic Signature(s) Signed: 04/24/2023 12:09:17 PM By: [name] PA-C Entered By: [name] on 04/24/2023 09:09:17 -------------------------------------------------------------------------------- Physician Orders Details Patient Name: Date of Service: Chad [name], Chad [name] 04/24/2023 10:45 A M Medical Record Number: [id number] Patient Account Number: [id number] Date of Birth/Sex: Treating RN: 02-16-1961 (62 y.o. 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 No doubt this has a lot to do with the discomfort in the right leg he describes when he is in bed. They have been using Xeroform on the wounds on the right. He has a lot of swelling in this leg with weeping edema 04-09-2023 upon evaluation today patient appears to be doing poorly still in regard to his right leg. 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  Fortunately I do not see any signs of active infection locally or systemically at this time which is great news. No fevers, chills, nausea, vomiting, or diarrhea. With that being said I am concerned still about the fact that he seems to be getting worse especially in regard to the heels I think that he is still sitting up most of the day which I think is still causing some swelling of his leg unfortunately. 04-16-2023 upon evaluation today patient actually appears to be doing little bit more poorly compared to last week this actually does appear to be infected. Last week I really did not think that was the case but this seems like it may indeed be the case. Fortunately I do not see any signs of active infection locally or systemically at this time. 04-24-2023 upon evaluation today patient appears to be doing well currently in regard to his leg compared to where we were previous I do feel like that the antibiotics have been of benefit. Fortunately I do not see any signs of active infection at this time which is great news and in general I believe that we are making headway towards healing which is great news as well. With that being said he still has a lot of drainage I really feel like something like Anola Gurney would be better for him than just an ABD pad it would likely the drainage more or even Zetuvit's but I am not sure whether we can get either 1 of these at the facility for him. Electronic Signature(s) Chad [name], Chad [name] ([id number]) [id number] Page 6 of 16 Signed: 04/24/2023 12:08:48 PM By: [name] PA-C Entered By: [name] on 04/24/2023 09:08:48 -------------------------------------------------------------------------------- Physical Exam Details Patient Name: Date of Service: Chad [name], Chad [name] 04/24/2023 10:45 A M Medical Record Number: [id number] Patient Account Number: [id number] Date of Birth/Sex: Treating RN: [birth year] (62 y.o. [name] Primary Care  Provider: [name] Other Clinician: Referring Provider: Treating Provider/Extender: [name] Weeks in Treatment: 4 Constitutional Well-nourished and well-hydrated in no acute distress. Respiratory normal breathing without difficulty. Psychiatric this patient is able to make decisions and demonstrates good insight into disease process. Alert and Oriented x 3. pleasant and cooperative. Notes Upon inspection patient's wound bed actually showed signs of good granulation and epithelization at this point. Fortunately I do not see worsening overall and I do believe that the patient is making good headway towards complete closure which is great news. Electronic Signature(s) Signed: 04/24/2023 12:09:17 PM By: [name] PA-C Entered By: [name] on 04/24/2023 09:09:17 -------------------------------------------------------------------------------- Physician Orders Details Patient Name: Date of Service: Chad [name], Chad [name] 04/24/2023 10:45 A M Medical Record Number: [id number] Patient Account Number: [id number] Date of Birth/Sex: Treating RN: 02-16-1961 (62 y.o. [name] Primary Care Provider: [name] Other Clinician: Referring Provider: Treating Provider/Extender: [name] Weeks in Treatment: 65 Verbal / Phone Orders: No Diagnosis Coding ICD-10 Coding Code Description [phone] Chronic venous hypertension (idiopathic) with ulcer and  Chad [name], Chad [name] ([id number]) [id number] Page 1 of 16 Visit Report for 04/24/2023 Chief Complaint Document Details Patient Name: Date of Service: Chad [name], Chad [name] 04/24/2023 10:45 A M Medical Record Number: [id number] Patient Account Number: [id number] Date of Birth/Sex: Treating RN: 05/20/1961 (62 y.o. [name] Primary Care Provider: [name] Other Clinician: Referring Provider: Treating Provider/Extender: [name] Weeks in Treatment: 72 Information Obtained from: Patient Chief Complaint Left ankle ulcer, Left dorsal foot wound, right heel Electronic Signature(s) Signed: 04/24/2023 10:30:42 AM By: [name] PA-C Entered By: [name] on 04/24/2023 07:30:41 -------------------------------------------------------------------------------- HPI Details Patient Name: Date of Service: Chad [name] 04/24/2023 10:45 A M Medical Record Number: [id number] Patient Account Number: [id number] Date of Birth/Sex: Treating RN: [birth year] (62 y.o. [name] Primary Care Provider: [name] Other Clinician: Referring Provider: Treating Provider/Extender: [name] Weeks in Treatment: 72 History of Present Illness HPI Description: 10/08/18 on evaluation today patient actually presents to our office for initial evaluation concerning wounds that he has of the bilateral lower extremities. 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 He also has bilateral venous stasis. He apparently has been homeless currently following discharge from the hospital apparently he has been placed at [facility] which is is a skilled nursing facility locally. Nonetheless fortunately he does not show any signs of infection at this time which is good news. In fact several of the wound  actually appears to be showing some signs of improvement already in my pinion. There are a couple areas in the left leg in particular there likely gonna require some sharp debridement to help clear away some necrotic tissue and help with more sufficient healing. No fevers, chills, nausea, or vomiting noted at this time. 10/15/18 on evaluation today patient actually appears to be doing very well in regard to his bilateral lower extremities. He's been tolerating the dressing changes without complication. Fortunately there does not appear to be any evidence of active infection at this time which is great news. Overall I'm actually very pleased with how this has progressed in just one visits time. Readmission: 08/14/2020 upon evaluation today patient presents for re-evaluation here in our clinic. He is having issues with his left ankle region as well as his right toe and his right heel. He tells me that the toe and heel actually began as a area that was itching that he was scratching and then subsequently opened up into wounds. These may have been abscess areas I presume based on what I am seeing currently. With regard to his left ankle region he tells me this was a similar type Chad [name], Chad [name] ([id number]) [id number] Page 2 of 16 occurrence although he does have venous stasis this very well may be more of a venous leg ulcer more than anything. Nonetheless I do believe that the patient would benefit from appropriate and aggressive wound care to try to help get things under better control here. He does have history of a stroke on the left side affecting him to some degree there that he is able to stand although he does have some residual weakness. Otherwise again the patient does have chronic venous insufficiency as previously noted. 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 No doubt this has a lot to do with the discomfort in the right leg he describes when he is in bed. They have been using Xeroform on the wounds on the right. He has a lot of swelling in this leg with weeping edema 04-09-2023 upon evaluation today patient appears to be doing poorly still in regard to his right leg. 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  Fortunately I do not see any signs of active infection locally or systemically at this time which is great news. No fevers, chills, nausea, vomiting, or diarrhea. With that being said I am concerned still about the fact that he seems to be getting worse especially in regard to the heels I think that he is still sitting up most of the day which I think is still causing some swelling of his leg unfortunately. 04-16-2023 upon evaluation today patient actually appears to be doing little bit more poorly compared to last week this actually does appear to be infected. Last week I really did not think that was the case but this seems like it may indeed be the case. Fortunately I do not see any signs of active infection locally or systemically at this time. 04-24-2023 upon evaluation today patient appears to be doing well currently in regard to his leg compared to where we were previous I do feel like that the antibiotics have been of benefit. Fortunately I do not see any signs of active infection at this time which is great news and in general I believe that we are making headway towards healing which is great news as well. With that being said he still has a lot of drainage I really feel like something like Anola Gurney would be better for him than just an ABD pad it would likely the drainage more or even Zetuvit's but I am not sure whether we can get either 1 of these at the facility for him. Electronic Signature(s) Chad [name], Chad [name] ([id number]) [id number] Page 6 of 16 Signed: 04/24/2023 12:08:48 PM By: [name] PA-C Entered By: [name] on 04/24/2023 09:08:48 -------------------------------------------------------------------------------- Physical Exam Details Patient Name: Date of Service: Chad [name], Chad [name] 04/24/2023 10:45 A M Medical Record Number: [id number] Patient Account Number: [id number] Date of Birth/Sex: Treating RN: [birth year] (62 y.o. [name] Primary Care  Provider: [name] Other Clinician: Referring Provider: Treating Provider/Extender: [name] Weeks in Treatment: 4 Constitutional Well-nourished and well-hydrated in no acute distress. Respiratory normal breathing without difficulty. Psychiatric this patient is able to make decisions and demonstrates good insight into disease process. Alert and Oriented x 3. pleasant and cooperative. Notes Upon inspection patient's wound bed actually showed signs of good granulation and epithelization at this point. Fortunately I do not see worsening overall and I do believe that the patient is making good headway towards complete closure which is great news. Electronic Signature(s) Signed: 04/24/2023 12:09:17 PM By: [name] PA-C Entered By: [name] on 04/24/2023 09:09:17 -------------------------------------------------------------------------------- Physician Orders Details Patient Name: Date of Service: Chad [name], Chad [name] 04/24/2023 10:45 A M Medical Record Number: [id number] Patient Account Number: [id number] Date of Birth/Sex: Treating RN: 02-16-1961 (62 y.o. [name] Primary Care Provider: [name] Other Clinician: Referring Provider: Treating Provider/Extender: [name] Weeks in Treatment: 65 Verbal / Phone Orders: No Diagnosis Coding ICD-10 Coding Code Description [phone] Chronic venous hypertension (idiopathic) with ulcer and  Chad [name], Chad [name] ([id number]) [id number] Page 1 of 16 Visit Report for 04/24/2023 Chief Complaint Document Details Patient Name: Date of Service: Chad [name], Chad [name] 04/24/2023 10:45 A M Medical Record Number: [id number] Patient Account Number: [id number] Date of Birth/Sex: Treating RN: 05/20/1961 (62 y.o. [name] Primary Care Provider: [name] Other Clinician: Referring Provider: Treating Provider/Extender: [name] Weeks in Treatment: 72 Information Obtained from: Patient Chief Complaint Left ankle ulcer, Left dorsal foot wound, right heel Electronic Signature(s) Signed: 04/24/2023 10:30:42 AM By: [name] PA-C Entered By: [name] on 04/24/2023 07:30:41 -------------------------------------------------------------------------------- HPI Details Patient Name: Date of Service: Chad [name] 04/24/2023 10:45 A M Medical Record Number: [id number] Patient Account Number: [id number] Date of Birth/Sex: Treating RN: [birth year] (62 y.o. [name] Primary Care Provider: [name] Other Clinician: Referring Provider: Treating Provider/Extender: [name] Weeks in Treatment: 72 History of Present Illness HPI Description: 10/08/18 on evaluation today patient actually presents to our office for initial evaluation concerning wounds that he has of the bilateral lower extremities. 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 He also has bilateral venous stasis. He apparently has been homeless currently following discharge from the hospital apparently he has been placed at [facility] which is is a skilled nursing facility locally. Nonetheless fortunately he does not show any signs of infection at this time which is good news. In fact several of the wound  actually appears to be showing some signs of improvement already in my pinion. There are a couple areas in the left leg in particular there likely gonna require some sharp debridement to help clear away some necrotic tissue and help with more sufficient healing. No fevers, chills, nausea, or vomiting noted at this time. 10/15/18 on evaluation today patient actually appears to be doing very well in regard to his bilateral lower extremities. He's been tolerating the dressing changes without complication. Fortunately there does not appear to be any evidence of active infection at this time which is great news. Overall I'm actually very pleased with how this has progressed in just one visits time. Readmission: 08/14/2020 upon evaluation today patient presents for re-evaluation here in our clinic. He is having issues with his left ankle region as well as his right toe and his right heel. He tells me that the toe and heel actually began as a area that was itching that he was scratching and then subsequently opened up into wounds. These may have been abscess areas I presume based on what I am seeing currently. With regard to his left ankle region he tells me this was a similar type Chad [name], Chad [name] ([id number]) [id number] Page 2 of 16 occurrence although he does have venous stasis this very well may be more of a venous leg ulcer more than anything. Nonetheless I do believe that the patient would benefit from appropriate and aggressive wound care to try to help get things under better control here. He does have history of a stroke on the left side affecting him to some degree there that he is able to stand although he does have some residual weakness. Otherwise again the patient does have chronic venous insufficiency as previously noted. 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 No doubt this has a lot to do with the discomfort in the right leg he describes when he is in bed. They have been using Xeroform on the wounds on the right. He has a lot of swelling in this leg with weeping edema 04-09-2023 upon evaluation today patient appears to be doing poorly still in regard to his right leg. 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  Fortunately I do not see any signs of active infection locally or systemically at this time which is great news. No fevers, chills, nausea, vomiting, or diarrhea. With that being said I am concerned still about the fact that he seems to be getting worse especially in regard to the heels I think that he is still sitting up most of the day which I think is still causing some swelling of his leg unfortunately. 04-16-2023 upon evaluation today patient actually appears to be doing little bit more poorly compared to last week this actually does appear to be infected. Last week I really did not think that was the case but this seems like it may indeed be the case. Fortunately I do not see any signs of active infection locally or systemically at this time. 04-24-2023 upon evaluation today patient appears to be doing well currently in regard to his leg compared to where we were previous I do feel like that the antibiotics have been of benefit. Fortunately I do not see any signs of active infection at this time which is great news and in general I believe that we are making headway towards healing which is great news as well. With that being said he still has a lot of drainage I really feel like something like Anola Gurney would be better for him than just an ABD pad it would likely the drainage more or even Zetuvit's but I am not sure whether we can get either 1 of these at the facility for him. Electronic Signature(s) Chad [name], Chad [name] ([id number]) [id number] Page 6 of 16 Signed: 04/24/2023 12:08:48 PM By: [name] PA-C Entered By: [name] on 04/24/2023 09:08:48 -------------------------------------------------------------------------------- Physical Exam Details Patient Name: Date of Service: Chad [name], Chad [name] 04/24/2023 10:45 A M Medical Record Number: [id number] Patient Account Number: [id number] Date of Birth/Sex: Treating RN: [birth year] (62 y.o. [name] Primary Care  Provider: [name] Other Clinician: Referring Provider: Treating Provider/Extender: [name] Weeks in Treatment: 4 Constitutional Well-nourished and well-hydrated in no acute distress. Respiratory normal breathing without difficulty. Psychiatric this patient is able to make decisions and demonstrates good insight into disease process. Alert and Oriented x 3. pleasant and cooperative. Notes Upon inspection patient's wound bed actually showed signs of good granulation and epithelization at this point. Fortunately I do not see worsening overall and I do believe that the patient is making good headway towards complete closure which is great news. Electronic Signature(s) Signed: 04/24/2023 12:09:17 PM By: [name] PA-C Entered By: [name] on 04/24/2023 09:09:17 -------------------------------------------------------------------------------- Physician Orders Details Patient Name: Date of Service: Chad [name], Chad [name] 04/24/2023 10:45 A M Medical Record Number: [id number] Patient Account Number: [id number] Date of Birth/Sex: Treating RN: 02-16-1961 (62 y.o. [name] Primary Care Provider: [name] Other Clinician: Referring Provider: Treating Provider/Extender: [name] Weeks in Treatment: 65 Verbal / Phone Orders: No Diagnosis Coding ICD-10 Coding Code Description [phone] Chronic venous hypertension (idiopathic) with ulcer and  or use excessive force. Pat dry using gauze sponges, not tissue or cotton balls. Prim Dressing: Silvercel Small 2x2 (in/in) 1 x Per Day/30 Days ary Discharge Instructions: Apply Silvercel Small 2x2 (in/in) as instructed Chad [name], Chad [name] ([id number]) [phone].pdf Page 8 of 16 Secondary Dressing: Zetuvit Plus 4x8 (in/in) 1 x Per Day/30 Days Secured With: Kerlix Roll Sterile or Non-Sterile 6-ply 4.5x4 (yd/yd) 1 x Per Day/30 Days Discharge Instructions: Apply Kerlix as directed Secured With: Tubigrip Size D, 3x10 (in/yd) 1 x Per Day/30 Days Wound #22 - Lower Leg Wound Laterality: Right,  Anterior Cleanser: Soap and Water 1 x Per Day/30 Days Discharge Instructions: Gently cleanse wound with antibacterial soap, rinse and pat dry prior to dressing wounds Cleanser: Wound Cleanser 1 x Per Day/30 Days Discharge Instructions: Wash your hands with soap and water. Remove old dressing, discard into plastic bag and place into trash. Cleanse the wound with Wound Cleanser prior to applying a clean dressing using gauze sponges, not tissues or cotton balls. Do not scrub or use excessive force. Pat dry using gauze sponges, not tissue or cotton balls. Prim Dressing: Silvercel Small 2x2 (in/in) 1 x Per Day/30 Days ary Discharge Instructions: Apply Silvercel Small 2x2 (in/in) as instructed Secondary Dressing: Zetuvit Plus 4x8 (in/in) 1 x Per Day/30 Days Secured With: Kerlix Roll Sterile or Non-Sterile 6-ply 4.5x4 (yd/yd) 1 x Per Day/30 Days Discharge Instructions: Apply Kerlix as directed Secured With: Tubigrip Size D, 3x10 (in/yd) 1 x Per Day/30 Days Electronic Signature(s) Signed: 04/24/2023 12:30:42 PM By: Midge Aver MSN RN CNS WTA Signed: 04/24/2023 2:21:22 PM By: [name] PA-C Entered By: Midge Aver on 04/24/2023 08:57:36 -------------------------------------------------------------------------------- Problem List Details Patient Name: Date of Service: Chad [name] 04/24/2023 10:45 A M Medical Record Number: [id number] Patient Account Number: [id number] Date of Birth/Sex: Treating RN: Dec 03, 1960 (63 y.o. 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 02/19/2023 No Yes Chad [name], Chad [name] ([id number]) [id number] Page 9 of 16 foot 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, stage 3 10/07/2022 No Yes Inactive Problems ICD-10 Code Description Active Date Inactive Date L97.322 Non-pressure chronic ulcer of left ankle with fat layer exposed 12/06/2021 12/06/2021 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 [name] Primary Care Provider: [name] Other Clinician: Referring Provider: Treating Provider/Extender: [name] Weeks in Treatment: 72 Subjective Chief Complaint Information obtained from Patient Left ankle ulcer, Left dorsal foot wound, right heel History of Present Illness (HPI) 10/08/18 on evaluation today patient actually presents to our office for initial evaluation concerning wounds that he has of the bilateral lower extremities. He has no history of known diabetes, he does have hepatitis C, urinary tract cancer for which  dry prior to dressing wounds Cleanser: Wound Cleanser 1 x Per Day/30 Days Discharge Instructions: Wash your hands with soap and water. Remove old dressing, discard into plastic bag and place into trash. Cleanse the wound with Wound Cleanser prior to applying a clean dressing using gauze sponges, not tissues or cotton balls. Do not scrub or use excessive force. Pat dry using gauze sponges, not tissue or cotton balls. Prim Dressing: Silvercel Small 2x2 (in/in) 1 x Per Day/30 Days ary Discharge Instructions: Apply Silvercel Small 2x2 (in/in) as instructed Secondary Dressing: Zetuvit Plus 4x8 (in/in) 1 x Per Day/30 Days Secured With: Kerlix Roll Sterile or Non-Sterile 6-ply 4.5x4 (yd/yd) 1 x Per Day/30 Days Discharge Instructions: Apply Kerlix as directed Secured With: Tubigrip Size D, 3x10 (in/yd) 1 x Per Day/30 Days WOUND #22: - Lower Leg Wound Laterality: Right, Anterior Cleanser: Soap and Water 1 x Per Day/30 Days Discharge Instructions: Gently cleanse wound with antibacterial soap, rinse and pat dry prior to dressing wounds Cleanser: Wound Cleanser 1 x Per Day/30 Days Discharge Instructions: Wash your hands with soap and water. Remove old dressing, discard into plastic bag and place into trash. Cleanse the wound with Wound Cleanser prior to applying a clean dressing using gauze sponges, not tissues or cotton balls. Do not scrub or use excessive force. Pat dry using gauze sponges, not tissue or cotton balls. Prim Dressing: Silvercel Small 2x2 (in/in) 1 x Per Day/30 Days ary Discharge  Instructions: Apply Silvercel Small 2x2 (in/in) as instructed Secondary Dressing: Zetuvit Plus 4x8 (in/in) 1 x Per Day/30 Days Secured With: Kerlix Roll Sterile or Non-Sterile 6-ply 4.5x4 (yd/yd) 1 x Per Day/30 Days Discharge Instructions: Apply Kerlix as directed Secured With: Tubigrip Size D, 3x10 (in/yd) 1 x Per Day/30 Days 1. I am going to recommend that we have the patient continue to monitor for any evidence of worsening. His dressings do need to be changed daily. 2. I am going to recommend XtraSorb as that option here for him in order to keep the drainage off of his skin and prevent any additional maceration. 3. I am going to recommend as well that the patient should continue to monitor for any signs of infection or worsening if anything changes he knows to let me know and the facility should be monitoring as well. We will see patient back for reevaluation in 1 week here in the clinic. If anything worsens or changes patient will contact our office for additional recommendations. Electronic Signature(s) Signed: 04/24/2023 12:10:27 PM By: [name] PA-C Entered By: [name] on 04/24/2023 09:10:27 -------------------------------------------------------------------------------- SuperBill Details Patient Name: Date of Service: Chad [name], Chad [name] 04/24/2023 Medical Record Number: [id number] Patient Account Number: [id number] Date of Birth/Sex: Treating RN: [birth year] (62 y.o. [name] Primary Care Provider: [name] Other Clinician: Referring Provider: Treating Provider/Extender: [name] Weeks in Treatment: [address] 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 No doubt this has a lot to do with the discomfort in the right leg he describes when he is in bed. They have been using Xeroform on the wounds on the right. He has a lot of swelling in this leg with weeping edema 04-09-2023 upon evaluation today patient appears to be doing poorly still in regard to his right leg. 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  Fortunately I do not see any signs of active infection locally or systemically at this time which is great news. No fevers, chills, nausea, vomiting, or diarrhea. With that being said I am concerned still about the fact that he seems to be getting worse especially in regard to the heels I think that he is still sitting up most of the day which I think is still causing some swelling of his leg unfortunately. 04-16-2023 upon evaluation today patient actually appears to be doing little bit more poorly compared to last week this actually does appear to be infected. Last week I really did not think that was the case but this seems like it may indeed be the case. Fortunately I do not see any signs of active infection locally or systemically at this time. 04-24-2023 upon evaluation today patient appears to be doing well currently in regard to his leg compared to where we were previous I do feel like that the antibiotics have been of benefit. Fortunately I do not see any signs of active infection at this time which is great news and in general I believe that we are making headway towards healing which is great news as well. With that being said he still has a lot of drainage I really feel like something like Anola Gurney would be better for him than just an ABD pad it would likely the drainage more or even Zetuvit's but I am not sure whether we can get either 1 of these at the facility for him. Electronic Signature(s) Chad [name], Chad [name] ([id number]) [id number] Page 6 of 16 Signed: 04/24/2023 12:08:48 PM By: [name] PA-C Entered By: [name] on 04/24/2023 09:08:48 -------------------------------------------------------------------------------- Physical Exam Details Patient Name: Date of Service: Chad [name], Chad [name] 04/24/2023 10:45 A M Medical Record Number: [id number] Patient Account Number: [id number] Date of Birth/Sex: Treating RN: [birth year] (62 y.o. [name] Primary Care  Provider: [name] Other Clinician: Referring Provider: Treating Provider/Extender: [name] Weeks in Treatment: 4 Constitutional Well-nourished and well-hydrated in no acute distress. Respiratory normal breathing without difficulty. Psychiatric this patient is able to make decisions and demonstrates good insight into disease process. Alert and Oriented x 3. pleasant and cooperative. Notes Upon inspection patient's wound bed actually showed signs of good granulation and epithelization at this point. Fortunately I do not see worsening overall and I do believe that the patient is making good headway towards complete closure which is great news. Electronic Signature(s) Signed: 04/24/2023 12:09:17 PM By: [name] PA-C Entered By: [name] on 04/24/2023 09:09:17 -------------------------------------------------------------------------------- Physician Orders Details Patient Name: Date of Service: Chad [name], Chad [name] 04/24/2023 10:45 A M Medical Record Number: [id number] Patient Account Number: [id number] Date of Birth/Sex: Treating RN: 02-16-1961 (62 y.o. [name] Primary Care Provider: [name] Other Clinician: Referring Provider: Treating Provider/Extender: [name] Weeks in Treatment: 65 Verbal / Phone Orders: No Diagnosis Coding ICD-10 Coding Code Description [phone] Chronic venous hypertension (idiopathic) with ulcer and

## 2023-04-27 ENCOUNTER — Ambulatory Visit (INDEPENDENT_AMBULATORY_CARE_PROVIDER_SITE_OTHER): Payer: Medicaid Other

## 2023-04-27 ENCOUNTER — Ambulatory Visit (INDEPENDENT_AMBULATORY_CARE_PROVIDER_SITE_OTHER): Payer: Medicaid Other | Admitting: Nurse Practitioner

## 2023-04-27 ENCOUNTER — Encounter (INDEPENDENT_AMBULATORY_CARE_PROVIDER_SITE_OTHER): Payer: Self-pay | Admitting: Nurse Practitioner

## 2023-04-27 VITALS — BP 102/68 | HR 98 | Resp 18 | Ht 67.0 in | Wt 138.0 lb

## 2023-04-27 DIAGNOSIS — Z89512 Acquired absence of left leg below knee: Secondary | ICD-10-CM

## 2023-04-27 NOTE — Progress Notes (Signed)
Subjective:    Patient ID: Chad [name], male    DOB: 1961/02/26, 62 y.o.   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  However the patient refused to have ABIs done today.  On examination of his amputation site all staples have been removed and the wound is healing fairly well.  There is just 1 area of opening along the incision.  He has another area which he notes he did by rubbing from itching that appears to be like a very minor skin tear.  There is no evidence of infection today.  The patient can bend his knee backwards but is unable to straighten his knee fully at this time unfortunately.  He does not complain of any significant pain.    Review of Systems  Skin:  Positive for wound.  Neurological:  Positive for weakness.  All other systems reviewed and are negative.      Objective:   Physical Exam Vitals reviewed.  HENT:     Head: Normocephalic.  Cardiovascular:     Rate and Rhythm: Normal rate.  Pulmonary:     Effort: Pulmonary effort is normal.  Musculoskeletal:     Left Lower Extremity: Left leg is amputated below knee.  Skin:    General: Skin is warm and dry.  Neurological:     Mental Status: He is alert and oriented to person, place, and time.     Motor: Weakness present.  Psychiatric:        Mood and Affect: Mood normal.        Behavior: Behavior normal.        Thought Content: Thought content normal.        Judgment: Judgment normal.     BP 102/68 (BP Location: Right Arm)   Pulse 98   Resp 18   Ht 5\' 7"  (1.702 m)   Wt 138 lb (62.6 kg)   BMI 21.61 kg/m   Past Medical History:  Diagnosis Date   Anemia    Anxiety    ARF (acute respiratory failure) (HCC)    Atherosclerosis of arteries of extremities (HCC)    Benign prostatic hyperplasia with urinary obstruction    Bladder cancer  (HCC)    BPH with obstruction/lower urinary tract symptoms    Chronic viral hepatitis C (HCC)    Colon cancer (HCC)    COPD (chronic obstructive pulmonary disease) (HCC)    Depression    Dysphagia    Family history of colon cancer    Family history of kidney cancer    Family history of leukemia    Family history of prostate cancer    Generalized anxiety disorder    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    GERD (gastroesophageal reflux disease)    Hepatitis    chronic hep c   Hydronephrosis    Hydronephrosis with ureteral stricture    Hyperlipidemia    Insomnia, unspecified    Knee pain    Left   Major depressive disorder, recurrent, moderate (HCC)    Malignant neoplasm of colon (HCC)    Malnutrition (HCC)    Muscle weakness (generalized)    Nerve pain    Other abnormalities of gait and mobility    Other chronic pain    Peripheral vascular disease (HCC)    Personal history  of transient cerebral ischemia    Pressure ulcer    Prostate cancer (HCC)    Stroke (HCC)    Tinea unguium    Tobacco user    Unspecified protein-calorie malnutrition (HCC)    Ureteral cancer, right (HCC)    Urinary frequency    Venous hypertension of both lower extremities    Xerosis cutis     Social History   Socioeconomic History   Marital status: Single    Spouse name: Not on file   Number of children: Not on file   Years of education: Not on file   Highest education level: Not on file  Occupational History   Not on file  Tobacco Use   Smoking status: Every Day    Current packs/day: 0.50    Types: Cigarettes   Smokeless tobacco: Never  Vaping Use   Vaping status: Never Used  Substance and Sexual Activity   Alcohol use: Not Currently   Drug use: Not Currently    Types: Marijuana, Methylphenidate    Comment: quit 1997-1998 ish   Sexual activity: Not Currently  Other Topics Concern   Not on file  Social History Narrative    used to live [place]; moved  To [place]- end of April  2019; in Nursing home; 1pp/day; quit alcohol. 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        Family- dad- prostate ca [at 78y]; brother- 18 died of prostate cancer; brother- 77- no cancers [[place]]; sonThayer Ohm [[place]];[name]-32y prostate ca [facility] [place]]; daughter- 53 [[place]]; another daughter 87 [[place]/add[place]]. will refer genetics counseling. Given MSI- abnormal; highly suspicious of Lynch syndrome.  Patient's son Cristal Deer aware of high possible lynch syndrome.   Social Determinants of Health   Financial Resource Strain: Not on file  Food Insecurity: No Food Insecurity (02/26/2023)   Hunger Vital Sign    Worried About Running Out of Food in the Last Year: Never true    Ran Out of Food in the Last Year: Never true  Transportation Needs: No Transportation Needs (02/26/2023)   PRAPARE - Administrator, Civil Service (Medical): No    Lack of Transportation (Non-Medical): No  Physical Activity: Not on file  Stress: Not on file  Social Connections: Not on file  Intimate Partner Violence: Not At Risk (02/26/2023)   Humiliation, Afraid, Rape, and Kick questionnaire    Fear of Current or Ex-Partner: No    Emotionally Abused: No    Physically Abused: No    Sexually Abused: No    Past Surgical History:  Procedure Laterality Date   AMPUTATION Left 03/03/2023   Procedure: AMPUTATION BELOW KNEE;  Surgeon: Renford Dills, MD;  Location: [facility];  Service: Vascular;  Laterality: Left;   COLON SURGERY     En bloc extended right hemicolectomy 07/2017   COLONOSCOPY WITH PROPOFOL N/A 11/06/2020   Procedure: COLONOSCOPY WITH PROPOFOL;  Surgeon: Wyline Mood, MD;  Location: [facility];  Service: Gastroenterology;  Laterality: N/A;   COLONOSCOPY WITH PROPOFOL N/A 07/31/2021   Procedure: COLONOSCOPY WITH PROPOFOL;  Surgeon: Earline Mayotte, MD;  Location: [facility] ENDOSCOPY;  Service: Endoscopy;  Laterality: N/A;   CYSTOSCOPY W/ RETROGRADES Right 08/30/2018   Procedure: CYSTOSCOPY WITH RETROGRADE  PYELOGRAM;  Surgeon: Vanna Scotland, MD;  Location: [facility];  Service: Urology;  Laterality: Right;   CYSTOSCOPY WITH STENT PLACEMENT Right 04/25/2018   Procedure: CYSTOSCOPY WITH STENT PLACEMENT;  Surgeon: Vanna Scotland, MD;  Location: [facility];  Service: Urology;  Laterality:  Right;   CYSTOSCOPY WITH STENT PLACEMENT Right 08/30/2018   Procedure: CYSTOSCOPY WITH STENT Exchange;  Surgeon: Vanna Scotland, MD;  Location: [facility];  Service: Urology;  Laterality: Right;   CYSTOSCOPY WITH STENT PLACEMENT Right 03/07/2019   Procedure: CYSTOSCOPY WITH STENT Exchange;  Surgeon: Vanna Scotland, MD;  Location: [facility];  Service: Urology;  Laterality: Right;   CYSTOSCOPY WITH STENT PLACEMENT Right 11/21/2019   Procedure: CYSTOSCOPY WITH STENT Exchange;  Surgeon: Vanna Scotland, MD;  Location: [facility];  Service: Urology;  Laterality: Right;   LOWER EXTREMITY ANGIOGRAPHY Left 05/23/2019   Procedure: LOWER EXTREMITY ANGIOGRAPHY;  Surgeon: Annice Needy, MD;  Location: [facility];  Service: Cardiovascular;  Laterality: Left;   LOWER EXTREMITY ANGIOGRAPHY Right 05/30/2019   Procedure: LOWER EXTREMITY ANGIOGRAPHY;  Surgeon: Annice Needy, MD;  Location: [facility];  Service: Cardiovascular;  Laterality: Right;   LOWER EXTREMITY ANGIOGRAPHY Right 02/13/2020   Procedure: LOWER EXTREMITY ANGIOGRAPHY;  Surgeon: Annice Needy, MD;  Location: [facility];  Service: Cardiovascular;  Laterality: Right;   LOWER EXTREMITY ANGIOGRAPHY Left 02/20/2020   Procedure: LOWER EXTREMITY ANGIOGRAPHY;  Surgeon: Annice Needy, MD;  Location: [facility];  Service: Cardiovascular;  Laterality: Left;   LOWER EXTREMITY ANGIOGRAPHY Left 01/01/2023   Procedure: Lower Extremity Angiography;  Surgeon: Annice Needy, MD;  Location: [facility];  Service: Cardiovascular;  Laterality: Left;   PORTA CATH INSERTION N/A 02/28/2019   Procedure: PORTA CATH INSERTION;  Surgeon: Annice Needy, MD;   Location: [facility];  Service: Cardiovascular;  Laterality: N/A;   ROBOT ASSISTED LAPAROSCOPIC PARTIAL COLECTOMY  11/17/2022   tumor removed       Family History  Problem Relation Age of Onset   Prostate cancer Father 53   Cancer Brother 22       unsure type   Cancer Paternal Uncle        unsure type   Cancer Maternal Grandmother        unsure type   Cancer Paternal Grandmother        unsure type   Kidney cancer Paternal Grandfather    Cancer Other        unsure types   Leukemia Son    Cancer Son        other cancers, possibly colon    Allergies  Allergen Reactions   Penicillins Rash       Latest Ref Rng & Units 03/31/2023    9:15 AM 03/06/2023    5:28 AM 03/04/2023    5:08 AM  CBC  WBC 4.0 - 10.5 K/uL 8.0  6.4  6.0   Hemoglobin 13.0 - 17.0 g/dL 9.9  8.3  9.2   Hematocrit 39.0 - 52.0 % 31.8  25.3  27.5   Platelets 150 - 400 K/uL 218  141  144       CMP     Component Value Date/Time   NA 136 03/31/2023 0915   K 4.0 03/31/2023 0915   CL 110 03/31/2023 0915   CO2 21 (L) 03/31/2023 0915   GLUCOSE 103 (H) 03/31/2023 0915   BUN 22 03/31/2023 0915   CREATININE 0.84 03/31/2023 0915   CALCIUM 8.3 (L) 03/31/2023 0915   PROT 6.5 03/31/2023 0915   ALBUMIN 2.8 (L) 03/31/2023 0915   AST 30 03/31/2023 0915   ALT 21 03/31/2023 0915   ALKPHOS 88 03/31/2023 0915   BILITOT 0.3 03/31/2023 0915   GFRNONAA >  60 03/31/2023 0915     No results found.     Assessment & Plan:   1. Hx of BKA, left (HCC) Today the patient's left below-knee amputation site is healing fairly well.  There is no evidence of infection and just a very small approximately 1 cm area of opening.  I have sent instructions to utilize Medihoney and cover with Xeroform.  Additionally concerning for the patient's amputation site is it appears to have an early contracture.  I have discussed with the patient the importance of trying to keep the knee straight.  Will evaluate this when he  returns.   Current Outpatient Medications on File Prior to Visit  Medication Sig Dispense Refill   acetaminophen (TYLENOL) 325 MG tablet Take 650 mg by mouth 3 (three) times daily.      Amino Acids-Protein Hydrolys (PRO-STAT) LIQD Take 45 mLs by mouth daily.     ascorbic acid (VITAMIN C) 500 MG tablet Take 1 tablet (500 mg total) by mouth 2 (two) times daily.     aspirin EC 81 MG tablet Take 1 tablet (81 mg total) by mouth daily. 150 tablet 2   atorvastatin (LIPITOR) 10 MG tablet Take 1 tablet (10 mg total) by mouth daily. 30 tablet 11   Calcium Carb-Cholecalciferol (CALCIUM CARBONATE-VITAMIN D3 PO) Take 2 tablets by mouth daily. 600-400 mg     clopidogrel (PLAVIX) 75 MG tablet Take 75 mg by mouth daily.     cyclobenzaprine (FEXMID) 7.5 MG tablet Take 15 mg by mouth at bedtime.     escitalopram (LEXAPRO) 5 MG tablet Take 5 mg by mouth daily.     ferrous fumarate (FERRETTS) 325 (106 Fe) MG TABS tablet Take 1 tablet by mouth.     gabapentin (NEURONTIN) 100 MG capsule Take 100 mg by mouth 3 (three) times daily.     ibuprofen (ADVIL) 400 MG tablet Take 1 tablet (400 mg total) by mouth every 6 (six) hours as needed for mild pain or moderate pain.     leptospermum manuka honey (MEDIHONEY) PSTE paste Apply 1 Application topically daily.     mirtazapine (REMERON) 7.5 MG tablet Take 7.5 mg by mouth at bedtime.     Multiple Vitamin (MULTIVITAMIN) tablet Take 1 tablet by mouth daily.     oxyCODONE 10 MG TABS Take 1-1.5 tablets (10-15 mg total) by mouth every 4 (four) hours as needed for severe pain or moderate pain. 30 tablet 0   Polyethyl Glycol-Propyl Glycol (SYSTANE) 0.4-0.3 % SOLN Apply to eye.     potassium chloride SA (KLOR-CON M) 20 MEQ tablet Take 2 tablets (40 mEq total) by mouth daily.     senna-docusate (SENOKOT-S) 8.6-50 MG tablet Take 1 tablet by mouth daily.     tamsulosin (FLOMAX) 0.4 MG CAPS capsule Take 1 capsule (0.4 mg total) by mouth daily after supper. 30 capsule 3   traMADol  (ULTRAM) 50 MG tablet Take 1 tablet (50 mg total) by mouth every 6 (six) hours as needed. 20 tablet 0   Current Facility-Administered Medications on File Prior to Visit  Medication Dose Route Frequency Provider Last Rate Last Admin   heparin lock flush 100 UNIT/ML injection             There are no Patient Instructions on file for this visit. No follow-ups on file.   Georgiana Spinner, NP

## 2023-04-30 ENCOUNTER — Inpatient Hospital Stay: Payer: Medicaid Other | Admitting: Internal Medicine

## 2023-04-30 ENCOUNTER — Inpatient Hospital Stay: Payer: Medicaid Other

## 2023-04-30 ENCOUNTER — Inpatient Hospital Stay: Payer: Medicaid Other | Attending: Internal Medicine

## 2023-05-01 ENCOUNTER — Encounter: Payer: Medicaid Other | Attending: Physician Assistant | Admitting: Physician Assistant

## 2023-05-01 DIAGNOSIS — L89613 Pressure ulcer of right heel, stage 3: Secondary | ICD-10-CM | POA: Diagnosis not present

## 2023-05-01 DIAGNOSIS — J449 Chronic obstructive pulmonary disease, unspecified: Secondary | ICD-10-CM | POA: Diagnosis not present

## 2023-05-01 DIAGNOSIS — D649 Anemia, unspecified: Secondary | ICD-10-CM | POA: Insufficient documentation

## 2023-05-01 DIAGNOSIS — I872 Venous insufficiency (chronic) (peripheral): Secondary | ICD-10-CM | POA: Insufficient documentation

## 2023-05-01 DIAGNOSIS — I70245 Atherosclerosis of native arteries of left leg with ulceration of other part of foot: Secondary | ICD-10-CM | POA: Insufficient documentation

## 2023-05-01 DIAGNOSIS — B1920 Unspecified viral hepatitis C without hepatic coma: Secondary | ICD-10-CM | POA: Diagnosis not present

## 2023-05-01 DIAGNOSIS — I2510 Atherosclerotic heart disease of native coronary artery without angina pectoris: Secondary | ICD-10-CM | POA: Diagnosis not present

## 2023-05-01 DIAGNOSIS — L97512 Non-pressure chronic ulcer of other part of right foot with fat layer exposed: Secondary | ICD-10-CM | POA: Insufficient documentation

## 2023-05-01 DIAGNOSIS — L98492 Non-pressure chronic ulcer of skin of other sites with fat layer exposed: Secondary | ICD-10-CM | POA: Diagnosis not present

## 2023-05-01 DIAGNOSIS — C679 Malignant neoplasm of bladder, unspecified: Secondary | ICD-10-CM | POA: Insufficient documentation

## 2023-05-01 DIAGNOSIS — Z933 Colostomy status: Secondary | ICD-10-CM | POA: Insufficient documentation

## 2023-05-01 DIAGNOSIS — I87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 hypertension (idiopathic) with ulcer and inflammation of left lower extremity: Secondary | ICD-10-CM | POA: Diagnosis present

## 2023-05-01 DIAGNOSIS — G629 Polyneuropathy, unspecified: Secondary | ICD-10-CM | POA: Diagnosis not present

## 2023-05-01 DIAGNOSIS — I69354 Hemiplegia and hemiparesis following cerebral infarction affecting left non-dominant side: Secondary | ICD-10-CM | POA: Insufficient documentation

## 2023-05-01 DIAGNOSIS — C187 Malignant neoplasm of sigmoid colon: Secondary | ICD-10-CM | POA: Diagnosis not present

## 2023-05-01 NOTE — Progress Notes (Signed)
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 No doubt this has a lot to do with the discomfort in the right leg he describes when he is in bed. They have been using Xeroform on the wounds on the right. He has a lot of swelling in this leg with weeping edema 04-09-2023 upon evaluation today patient appears to be doing poorly still in regard to his right leg. 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  Fortunately I do not see any signs of active infection locally or systemically at this time which is great news. No fevers, chills, nausea, vomiting, or diarrhea. With that being said I am concerned still about the fact that he seems to be getting worse especially in regard to the heels I think that he is still sitting up most of the day which I think is still causing some swelling of his leg unfortunately. 04-16-2023 upon evaluation today patient actually appears to be doing little bit more poorly compared to last week this actually does appear to be infected. Last week I really did not think that was the case but this seems like it may indeed be the case. Fortunately I do not see any signs of active infection locally or systemically at this time. 04-24-2023 upon evaluation today patient appears to be doing well currently in regard to his leg compared to where we were previous I do feel like that the antibiotics have been of benefit. Fortunately I do not see any signs of active infection at this time which is great news and in general I believe that we are making headway towards healing which is great news as well. With that being said he still has a lot of drainage I really feel like something like Anola Gurney would be better for him than just an ABD pad it would likely the drainage more or even Zetuvit's but I am not sure whether we can get either 1 of these at the facility for him. 05-01-2023 upon evaluation today patient appears to be doing well currently in regard to his wounds which are showing signs of significant improvement is not any way shape or form infected like it was last time I saw him. I think the antibiotics are doing a good job but I think he may need an extension here to ensure [place], [place]O ([id number]) [id number] Page 6 of 17 that it completely clears. Electronic Signature(s) Signed: 05/01/2023 11:14:55 AM By: [name] PA-C Entered By: [name] on  05/01/2023 08:14:54 -------------------------------------------------------------------------------- Physical Exam Details Patient Name: Date of Service: Chad [name], Chad [name] 05/01/2023 10:45 A M Medical Record Number: [id number] Patient Account Number: [id number] Date of Birth/Sex: Treating RN: 05/12/1961 (62 y.o. [name] Primary Care Provider: [name] Other Clinician: Referring Provider: Treating Provider/Extender: [name] Weeks in Treatment: 52 Constitutional Well-nourished and well-hydrated in no acute distress. Respiratory normal breathing without difficulty. Psychiatric this patient is able to make decisions and demonstrates good insight into disease process. Alert and Oriented x 3. pleasant and cooperative. Notes Upon inspection patient's wound bed actually showed signs of good granulation and some areas there was still some slough and biofilm he still having a lot of pain and quite a bit of drainage. For that reason I do think that it would be good for [place] to go ahead and see about extension of the antibiotics for him he is in agreement with that plan. 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 No doubt this has a lot to do with the discomfort in the right leg he describes when he is in bed. They have been using Xeroform on the wounds on the right. He has a lot of swelling in this leg with weeping edema 04-09-2023 upon evaluation today patient appears to be doing poorly still in regard to his right leg. 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  Fortunately I do not see any signs of active infection locally or systemically at this time which is great news. No fevers, chills, nausea, vomiting, or diarrhea. With that being said I am concerned still about the fact that he seems to be getting worse especially in regard to the heels I think that he is still sitting up most of the day which I think is still causing some swelling of his leg unfortunately. 04-16-2023 upon evaluation today patient actually appears to be doing little bit more poorly compared to last week this actually does appear to be infected. Last week I really did not think that was the case but this seems like it may indeed be the case. Fortunately I do not see any signs of active infection locally or systemically at this time. 04-24-2023 upon evaluation today patient appears to be doing well currently in regard to his leg compared to where we were previous I do feel like that the antibiotics have been of benefit. Fortunately I do not see any signs of active infection at this time which is great news and in general I believe that we are making headway towards healing which is great news as well. With that being said he still has a lot of drainage I really feel like something like Anola Gurney would be better for him than just an ABD pad it would likely the drainage more or even Zetuvit's but I am not sure whether we can get either 1 of these at the facility for him. 05-01-2023 upon evaluation today patient appears to be doing well currently in regard to his wounds which are showing signs of significant improvement is not any way shape or form infected like it was last time I saw him. I think the antibiotics are doing a good job but I think he may need an extension here to ensure [place], [place] ([id number]) [id number] Page 6 of 17 that it completely clears. Electronic Signature(s) Signed: 05/01/2023 11:14:55 AM By: [name] PA-C Entered By: [name] on  05/01/2023 08:14:54 -------------------------------------------------------------------------------- Physical Exam Details Patient Name: Date of Service: Chad [name], Chad [name] 05/01/2023 10:45 A M Medical Record Number: [id number] Patient Account Number: [id number] Date of Birth/Sex: Treating RN: 05/12/1961 (62 y.o. [name] Primary Care Provider: [name] Other Clinician: Referring Provider: Treating Provider/Extender: [name] Weeks in Treatment: 52 Constitutional Well-nourished and well-hydrated in no acute distress. Respiratory normal breathing without difficulty. Psychiatric this patient is able to make decisions and demonstrates good insight into disease process. Alert and Oriented x 3. pleasant and cooperative. Notes Upon inspection patient's wound bed actually showed signs of good granulation and some areas there was still some slough and biofilm he still having a lot of pain and quite a bit of drainage. For that reason I do think that it would be good for [place] to go ahead and see about extension of the antibiotics for him he is in agreement with that plan. 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 No doubt this has a lot to do with the discomfort in the right leg he describes when he is in bed. They have been using Xeroform on the wounds on the right. He has a lot of swelling in this leg with weeping edema 04-09-2023 upon evaluation today patient appears to be doing poorly still in regard to his right leg. 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  Fortunately I do not see any signs of active infection locally or systemically at this time which is great news. No fevers, chills, nausea, vomiting, or diarrhea. With that being said I am concerned still about the fact that he seems to be getting worse especially in regard to the heels I think that he is still sitting up most of the day which I think is still causing some swelling of his leg unfortunately. 04-16-2023 upon evaluation today patient actually appears to be doing little bit more poorly compared to last week this actually does appear to be infected. Last week I really did not think that was the case but this seems like it may indeed be the case. Fortunately I do not see any signs of active infection locally or systemically at this time. 04-24-2023 upon evaluation today patient appears to be doing well currently in regard to his leg compared to where we were previous I do feel like that the antibiotics have been of benefit. Fortunately I do not see any signs of active infection at this time which is great news and in general I believe that we are making headway towards healing which is great news as well. With that being said he still has a lot of drainage I really feel like something like Anola Gurney would be better for him than just an ABD pad it would likely the drainage more or even Zetuvit's but I am not sure whether we can get either 1 of these at the facility for him. 05-01-2023 upon evaluation today patient appears to be doing well currently in regard to his wounds which are showing signs of significant improvement is not any way shape or form infected like it was last time I saw him. I think the antibiotics are doing a good job but I think he may need an extension here to ensure [place], [place] ([id number]) [id number] Page 6 of 17 that it completely clears. Electronic Signature(s) Signed: 05/01/2023 11:14:55 AM By: [name] PA-C Entered By: [name] on  05/01/2023 08:14:54 -------------------------------------------------------------------------------- Physical Exam Details Patient Name: Date of Service: Chad [name], Chad [name] 05/01/2023 10:45 A M Medical Record Number: [id number] Patient Account Number: [id number] Date of Birth/Sex: Treating RN: 05/12/1961 (62 y.o. [name] Primary Care Provider: [name] Other Clinician: Referring Provider: Treating Provider/Extender: [name] Weeks in Treatment: 52 Constitutional Well-nourished and well-hydrated in no acute distress. Respiratory normal breathing without difficulty. Psychiatric this patient is able to make decisions and demonstrates good insight into disease process. Alert and Oriented x 3. pleasant and cooperative. Notes Upon inspection patient's wound bed actually showed signs of good granulation and some areas there was still some slough and biofilm he still having a lot of pain and quite a bit of drainage. For that reason I do think that it would be good for [place] to go ahead and see about extension of the antibiotics for him he is in agreement with that plan. 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 No doubt this has a lot to do with the discomfort in the right leg he describes when he is in bed. They have been using Xeroform on the wounds on the right. He has a lot of swelling in this leg with weeping edema 04-09-2023 upon evaluation today patient appears to be doing poorly still in regard to his right leg. 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  Fortunately I do not see any signs of active infection locally or systemically at this time which is great news. No fevers, chills, nausea, vomiting, or diarrhea. With that being said I am concerned still about the fact that he seems to be getting worse especially in regard to the heels I think that he is still sitting up most of the day which I think is still causing some swelling of his leg unfortunately. 04-16-2023 upon evaluation today patient actually appears to be doing little bit more poorly compared to last week this actually does appear to be infected. Last week I really did not think that was the case but this seems like it may indeed be the case. Fortunately I do not see any signs of active infection locally or systemically at this time. 04-24-2023 upon evaluation today patient appears to be doing well currently in regard to his leg compared to where we were previous I do feel like that the antibiotics have been of benefit. Fortunately I do not see any signs of active infection at this time which is great news and in general I believe that we are making headway towards healing which is great news as well. With that being said he still has a lot of drainage I really feel like something like Anola Gurney would be better for him than just an ABD pad it would likely the drainage more or even Zetuvit's but I am not sure whether we can get either 1 of these at the facility for him. 05-01-2023 upon evaluation today patient appears to be doing well currently in regard to his wounds which are showing signs of significant improvement is not any way shape or form infected like it was last time I saw him. I think the antibiotics are doing a good job but I think he may need an extension here to ensure [place], [place] ([id number]) [id number] Page 6 of 17 that it completely clears. Electronic Signature(s) Signed: 05/01/2023 11:14:55 AM By: [name] PA-C Entered By: [name] on  05/01/2023 08:14:54 -------------------------------------------------------------------------------- Physical Exam Details Patient Name: Date of Service: Chad [name], Chad [name] 05/01/2023 10:45 A M Medical Record Number: [id number] Patient Account Number: [id number] Date of Birth/Sex: Treating RN: 05/12/1961 (62 y.o. [name] Primary Care Provider: [name] Other Clinician: Referring Provider: Treating Provider/Extender: [name] Weeks in Treatment: 52 Constitutional Well-nourished and well-hydrated in no acute distress. Respiratory normal breathing without difficulty. Psychiatric this patient is able to make decisions and demonstrates good insight into disease process. Alert and Oriented x 3. pleasant and cooperative. Notes Upon inspection patient's wound bed actually showed signs of good granulation and some areas there was still some slough and biofilm he still having a lot of pain and quite a bit of drainage. For that reason I do think that it would be good for [place] to go ahead and see about extension of the antibiotics for him he is in agreement with that plan. 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 No doubt this has a lot to do with the discomfort in the right leg he describes when he is in bed. They have been using Xeroform on the wounds on the right. He has a lot of swelling in this leg with weeping edema 04-09-2023 upon evaluation today patient appears to be doing poorly still in regard to his right leg. 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  Fortunately I do not see any signs of active infection locally or systemically at this time which is great news. No fevers, chills, nausea, vomiting, or diarrhea. With that being said I am concerned still about the fact that he seems to be getting worse especially in regard to the heels I think that he is still sitting up most of the day which I think is still causing some swelling of his leg unfortunately. 04-16-2023 upon evaluation today patient actually appears to be doing little bit more poorly compared to last week this actually does appear to be infected. Last week I really did not think that was the case but this seems like it may indeed be the case. Fortunately I do not see any signs of active infection locally or systemically at this time. 04-24-2023 upon evaluation today patient appears to be doing well currently in regard to his leg compared to where we were previous I do feel like that the antibiotics have been of benefit. Fortunately I do not see any signs of active infection at this time which is great news and in general I believe that we are making headway towards healing which is great news as well. With that being said he still has a lot of drainage I really feel like something like Anola Gurney would be better for him than just an ABD pad it would likely the drainage more or even Zetuvit's but I am not sure whether we can get either 1 of these at the facility for him. 05-01-2023 upon evaluation today patient appears to be doing well currently in regard to his wounds which are showing signs of significant improvement is not any way shape or form infected like it was last time I saw him. I think the antibiotics are doing a good job but I think he may need an extension here to ensure [place], [place] ([id number]) [id number] Page 6 of 17 that it completely clears. Electronic Signature(s) Signed: 05/01/2023 11:14:55 AM By: [name] PA-C Entered By: [name] on  05/01/2023 08:14:54 -------------------------------------------------------------------------------- Physical Exam Details Patient Name: Date of Service: Chad [name], Chad [name] 05/01/2023 10:45 A M Medical Record Number: [id number] Patient Account Number: [id number] Date of Birth/Sex: Treating RN: 05/12/1961 (62 y.o. [name] Primary Care Provider: [name] Other Clinician: Referring Provider: Treating Provider/Extender: [name] Weeks in Treatment: 52 Constitutional Well-nourished and well-hydrated in no acute distress. Respiratory normal breathing without difficulty. Psychiatric this patient is able to make decisions and demonstrates good insight into disease process. Alert and Oriented x 3. pleasant and cooperative. Notes Upon inspection patient's wound bed actually showed signs of good granulation and some areas there was still some slough and biofilm he still having a lot of pain and quite a bit of drainage. For that reason I do think that it would be good for [place] to go ahead and see about extension of the antibiotics for him he is in agreement with that plan. Electronic Signature(s)  Apply Kerlix as directed Secured With: Tubigrip Size D, 3x10 (in/yd) 1 x Per Day/30 Days Wound #21 - Foot Wound Laterality: Dorsal, Right Cleanser: Soap and Water 1 x Per Day/30 Days Discharge Instructions: Gently cleanse wound with antibacterial soap, rinse and pat dry prior to dressing wounds [name] ([id number]) [id number] Page 8 of 17 Cleanser: Wound Cleanser 1 x Per Day/30 Days Discharge Instructions: Wash your hands with soap and water. Remove old dressing, discard into plastic bag and place into trash. Cleanse the wound with Wound Cleanser prior to applying a clean dressing using gauze sponges, not tissues or cotton balls. Do not scrub or use excessive force. Pat dry using gauze  sponges, not tissue or cotton balls. Prim Dressing: Silvercel Small 2x2 (in/in) 1 x Per Day/30 Days ary Discharge Instructions: Apply Silvercel Small 2x2 (in/in) as instructed Secondary Dressing: Zetuvit Plus 4x8 (in/in) 1 x Per Day/30 Days Secured With: Kerlix Roll Sterile or Non-Sterile 6-ply 4.5x4 (yd/yd) 1 x Per Day/30 Days Discharge Instructions: Apply Kerlix as directed Secured With: Tubigrip Size D, 3x10 (in/yd) 1 x Per Day/30 Days Wound #22 - Lower Leg Wound Laterality: Right, Anterior Cleanser: Soap and Water 1 x Per Day/30 Days Discharge Instructions: Gently cleanse wound with antibacterial soap, rinse and pat dry prior to dressing wounds Cleanser: Wound Cleanser 1 x Per Day/30 Days Discharge Instructions: Wash your hands with soap and water. Remove old dressing, discard into plastic bag and place into trash. Cleanse the wound with Wound Cleanser prior to applying a clean dressing using gauze sponges, not tissues or cotton balls. Do not scrub or use excessive force. Pat dry using gauze sponges, not tissue or cotton balls. Prim Dressing: Silvercel Small 2x2 (in/in) 1 x Per Day/30 Days ary Discharge Instructions: Apply Silvercel Small 2x2 (in/in) as instructed Secondary Dressing: Zetuvit Plus 4x8 (in/in) 1 x Per Day/30 Days Secured With: Kerlix Roll Sterile or Non-Sterile 6-ply 4.5x4 (yd/yd) 1 x Per Day/30 Days Discharge Instructions: Apply Kerlix as directed Secured With: Tubigrip Size D, 3x10 (in/yd) 1 x Per Day/30 Days Patient Medications llergies: penicillin A Notifications Medication Indication Start End 05/01/2023 levofloxacin DOSE 1 - oral 500 mg tablet - 1 tablet oral once daily x 14 days. Do not take calcium or multivitamin with this medication Electronic Signature(s) Signed: 05/01/2023 11:18:49 AM By: [name] PA-C Entered By: [name] on 05/01/2023 08:18:47 Prescription  05/01/2023 -------------------------------------------------------------------------------- Melvia Heaps PA-C Patient Name: Provider: Jul 27, 1961 [id number] Date of Birth: NPI#Geoffery Spruce Sex: DEA #: [phone] Phone #: License #: UPN: Patient Address: [address] [facility] [place], [place] [address] [facility] [address] [place], [place] [address] [phone] Chad [name], Chad [name] ([id number]) [id number] Page 9 of 17 Allergies penicillin Medication Medication: Route: Strength: Form: levofloxacin oral 500 mg tablet Class: QUINOLONE ANTIBIOTICS Dose: Frequency / Time: Indication: 1 1 tablet oral once daily x 14 days. Do not take calcium or multivitamin with this medication Number of Refills: Number of Units: 0 Fourteen (14) Tablet(s) Generic Substitution: Start Date: End Date: Administered at Facility: Substitution Permitted 05/01/2023 No Note to Pharmacy: Hand Signature: Date(s): Electronic Signature(s) Signed: 05/01/2023 11:19:32 AM By: [name] PA-C Entered By: [name] on 05/01/2023 08:18:49 -------------------------------------------------------------------------------- Problem List Details Patient Name: Date of Service: Chad [name] 05/01/2023 10:45 A M Medical Record Number: [id number] Patient Account Number: [id number] Date of Birth/Sex: Treating RN: Feb 06, 1961 (62 y.o. [name] Primary Care Provider: [name] Other Clinician: Referring Provider:  NOACH, Chad [name] ([id number]) [id number] Page 1 of 17 Visit Report for 05/01/2023 Chief Complaint Document Details Patient Name: Date of Service: Chad [name], Chad [name] 05/01/2023 10:45 A M Medical Record Number: [id number] Patient Account Number: [id number] Date of Birth/Sex: Treating RN: 12/30/1960 (62 y.o. [name] Primary Care Provider: [name] Other Clinician: Referring Provider: Treating Provider/Extender: [name] Weeks in Treatment: 85 Information Obtained from: Patient Chief Complaint Left ankle ulcer, Left dorsal foot wound, right heel Electronic Signature(s) Signed: 05/01/2023 11:02:29 AM By: [name] PA-C Entered By: [name] on 05/01/2023 08:02:28 -------------------------------------------------------------------------------- HPI Details Patient Name: Date of Service: Chad [name] 05/01/2023 10:45 A M Medical Record Number: [id number] Patient Account Number: [id number] Date of Birth/Sex: Treating RN: 1961/07/09 (62 y.o. [name] Primary Care Provider: [name] Other Clinician: Referring Provider: Treating Provider/Extender: [name] in Treatment: 63 History of Present Illness HPI Description: 10/08/18 on evaluation today patient actually presents to our office for initial evaluation concerning wounds that he has of the bilateral lower extremities. 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 He also has bilateral venous stasis. He apparently has been homeless currently following discharge from the hospital apparently he has been placed at [facility] which is is a skilled nursing facility locally. Nonetheless fortunately he does not show any signs of infection at this time which is good news. In fact several of the wound  actually appears to be showing some signs of improvement already in my pinion. There are a couple areas in the left leg in particular there likely gonna require some sharp debridement to help clear away some necrotic tissue and help with more sufficient healing. No fevers, chills, nausea, or vomiting noted at this time. 10/15/18 on evaluation today patient actually appears to be doing very well in regard to his bilateral lower extremities. He's been tolerating the dressing changes without complication. Fortunately there does not appear to be any evidence of active infection at this time which is great news. Overall I'm actually very pleased with how this has progressed in just one visits time. Readmission: 08/14/2020 upon evaluation today patient presents for re-evaluation here in our clinic. He is having issues with his left ankle region as well as his right toe and his right heel. He tells me that the toe and heel actually began as a area that was itching that he was scratching and then subsequently opened up into wounds. These may have been abscess areas I presume based on what I am seeing currently. With regard to his left ankle region he tells me this was a similar type JOHATHON, OVERTURF ([id number]) [id number] Page 2 of 17 occurrence although he does have venous stasis this very well may be more of a venous leg ulcer more than anything. Nonetheless I do believe that the patient would benefit from appropriate and aggressive wound care to try to help get things under better control here. He does have history of a stroke on the left side affecting him to some degree there that he is able to stand although he does have some residual weakness. Otherwise again the patient does have chronic venous insufficiency as previously noted. His arterial studies most recently obtained showed that he had an ABI on the right of 1.16 with a TBI of 0.52 and on the left and ABI of 1.14 with  place into trash. Cleanse the wound with Wound Cleanser prior to applying a clean dressing using gauze sponges, not tissues or cotton balls. Do not scrub or use excessive force. Pat dry using gauze sponges, not tissue or cotton balls. Prim Dressing: Silvercel Small 2x2 (in/in) 1 x Per Day/30 Days ary Discharge Instructions: Apply Silvercel Small 2x2 (in/in) as instructed Secondary Dressing: Zetuvit Plus 4x8 (in/in) 1 x Per Day/30 Days Secured With: Kerlix Roll Sterile or Non-Sterile 6-ply 4.5x4 (yd/yd) 1 x Per Day/30 Days Discharge Instructions: Apply Kerlix as directed Secured With: Tubigrip Size D, 3x10 (in/yd) 1 x Per Day/30 Days 1. I would recommend that we have the patient continue to monitor for any signs of infection or worsening. In general I think that he is making really good headway towards closure I am very pleased in that regard. 2. I am going to recommend as well that he should continue with the Tubigrip size D which I think is doing well also using the alginate dressing. 3. Muscle can recommend he should continue to elevate his legs much as possible to more this he can do the better. 4. I am going to go ahead and repeat up with the Levaquin for 2 additional weeks I will get that sent into the pharmacy for him as well he is in agreement with that plan. We will see patient back for reevaluation in 1 week here in the clinic. If anything worsens or changes patient will contact our office for additional recommendations. Electronic Signature(s) Signed: 05/01/2023 11:19:01 AM By: [name] PA-C Previous Signature:  05/01/2023 11:16:16 AM Version By: [name] PA-C Entered By: [name] on 05/01/2023 08:19:00 -------------------------------------------------------------------------------- SuperBill Details Patient Name: Date of Service: Chad [name], Chad [name] 05/01/2023 Medical Record Number: [id number] Patient Account Number: [id number] Date of Birth/Sex: Treating RN: 1960/12/17 (62 y.o. 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, stage 3 Facility Procedures : CPT4 Code: 41324401 Description: 02725 - WOUND CARE VISIT-LEV 5 EST PT Modifier: Quantity: 1 Physician Procedures : CPT4 Code Description Modifier [id number] - WC PHYS LEVEL 4 - EST PT ICD-10 Diagnosis Description 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 (idiopathic) with ulcer and inflammation of left lower extremity L98.492 Non-pressure chronic ulcer of skin of  other sites with fat layer exposed 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 Cleanse the wound with Wound Cleanser prior to applying a clean dressing using gauze sponges, not tissues or cotton balls. Do not scrub or use excessive force. Pat dry using gauze sponges, not tissue or cotton balls. Prim Dressing: Silvercel Small 2x2 (in/in) 1 x Per Day/30 Days ary Discharge Instructions: Apply Silvercel Small 2x2 (in/in) as instructed Secondary Dressing: Zetuvit Plus 4x8 (in/in) 1 x Per Day/30 Days Secured With: Kerlix Roll Sterile or Non-Sterile 6-ply 4.5x4 (yd/yd) 1 x Per Day/30 Days Discharge Instructions: Apply Kerlix as directed Secured With: Tubigrip Size D, 3x10 (in/yd) 1 x Per Day/30 Days 1. I would recommend that we have the patient continue to monitor for any signs of infection or worsening. In general I think that he is making really good headway towards closure I am very pleased in that regard. 2. I am going to recommend as well that he should continue with the Tubigrip size D which I think is doing well also using the alginate dressing. 3. Muscle can recommend he should continue to elevate his legs much as possible to more this he can do the better. 4. I am going to go ahead and repeat up with the Levaquin for 2 additional weeks I will get that sent into the pharmacy for him as well he is in agreement with that plan. We will see patient back for reevaluation in 1 week here in the clinic. If anything worsens or changes patient will contact our office for additional recommendations. Electronic Signature(s) Signed: 05/01/2023 11:19:01 AM By: [name] PA-C Previous Signature:  05/01/2023 11:16:16 AM Version By: [name] PA-C Entered By: [name] on 05/01/2023 08:19:00 -------------------------------------------------------------------------------- SuperBill Details Patient Name: Date of Service: Chad [name], Chad [name] 05/01/2023 Medical Record Number: [id number] Patient Account Number: [id number] Date of Birth/Sex: Treating RN: 1960/12/17 (62 y.o. 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, stage 3 Facility Procedures : CPT4 Code: 41324401 Description: 02725 - WOUND CARE VISIT-LEV 5 EST PT Modifier: Quantity: 1 Physician Procedures : CPT4 Code Description Modifier [id number] - WC PHYS LEVEL 4 - EST PT ICD-10 Diagnosis Description 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 (idiopathic) with ulcer and inflammation of left lower extremity L98.492 Non-pressure chronic ulcer of skin of  other sites with fat layer exposed 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 [name] Primary Care Provider: [name] Other Clinician: Referring Provider: Treating Provider/Extender: [name] Weeks in Treatment: 85 Information Obtained from: Patient Chief Complaint Left ankle ulcer, Left dorsal foot wound, right heel Electronic Signature(s) Signed: 05/01/2023 11:02:29 AM By: [name] PA-C Entered By: [name] on 05/01/2023 08:02:28 -------------------------------------------------------------------------------- HPI Details Patient Name: Date of Service: Chad [name] 05/01/2023 10:45 A M Medical Record Number: [id number] Patient Account Number: [id number] Date of Birth/Sex: Treating RN: 1961/07/09 (62 y.o. [name] Primary Care Provider: [name] Other Clinician: Referring Provider: Treating Provider/Extender: [name] in Treatment: 63 History of Present Illness HPI Description: 10/08/18 on evaluation today patient actually presents to our office for initial evaluation concerning wounds that he has of the bilateral lower extremities. 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 He also has bilateral venous stasis. He apparently has been homeless currently following discharge from the hospital apparently he has been placed at [facility] which is is a skilled nursing facility locally. Nonetheless fortunately he does not show any signs of infection at this time which is good news. In fact several of the wound  actually appears to be showing some signs of improvement already in my pinion. There are a couple areas in the left leg in particular there likely gonna require some sharp debridement to help clear away some necrotic tissue and help with more sufficient healing. No fevers, chills, nausea, or vomiting noted at this time. 10/15/18 on evaluation today patient actually appears to be doing very well in regard to his bilateral lower extremities. He's been tolerating the dressing changes without complication. Fortunately there does not appear to be any evidence of active infection at this time which is great news. Overall I'm actually very pleased with how this has progressed in just one visits time. Readmission: 08/14/2020 upon evaluation today patient presents for re-evaluation here in our clinic. He is having issues with his left ankle region as well as his right toe and his right heel. He tells me that the toe and heel actually began as a area that was itching that he was scratching and then subsequently opened up into wounds. These may have been abscess areas I presume based on what I am seeing currently. With regard to his left ankle region he tells me this was a similar type JOHATHON, OVERTURF ([id number]) [id number] Page 2 of 17 occurrence although he does have venous stasis this very well may be more of a venous leg ulcer more than anything. Nonetheless I do believe that the patient would benefit from appropriate and aggressive wound care to try to help get things under better control here. He does have history of a stroke on the left side affecting him to some degree there that he is able to stand although he does have some residual weakness. Otherwise again the patient does have chronic venous insufficiency as previously noted. His arterial studies most recently obtained showed that he had an ABI on the right of 1.16 with a TBI of 0.52 and on the left and ABI of 1.14 with  Chad [name], Chad [name] ([id number]) [id number] Page 1 of 17 Visit Report for 05/01/2023 Chief Complaint Document Details Patient Name: Date of Service: Chad [name], Chad [name] 05/01/2023 10:45 A M Medical Record Number: [id number] Patient Account Number: [id number] Date of Birth/Sex: Treating RN: 12/30/1960 (62 y.o. [name] Primary Care Provider: [name] Other Clinician: Referring Provider: Treating Provider/Extender: [name] Weeks in Treatment: 85 Information Obtained from: Patient Chief Complaint Left ankle ulcer, Left dorsal foot wound, right heel Electronic Signature(s) Signed: 05/01/2023 11:02:29 AM By: [name] PA-C Entered By: [name] on 05/01/2023 08:02:28 -------------------------------------------------------------------------------- HPI Details Patient Name: Date of Service: Chad [name] 05/01/2023 10:45 A M Medical Record Number: [id number] Patient Account Number: [id number] Date of Birth/Sex: Treating RN: 1961/07/09 (62 y.o. [name] Primary Care Provider: [name] Other Clinician: Referring Provider: Treating Provider/Extender: [name] in Treatment: 63 History of Present Illness HPI Description: 10/08/18 on evaluation today patient actually presents to our office for initial evaluation concerning wounds that he has of the bilateral lower extremities. 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 He also has bilateral venous stasis. He apparently has been homeless currently following discharge from the hospital apparently he has been placed at [facility] which is is a skilled nursing facility locally. Nonetheless fortunately he does not show any signs of infection at this time which is good news. In fact several of the wound  actually appears to be showing some signs of improvement already in my pinion. There are a couple areas in the left leg in particular there likely gonna require some sharp debridement to help clear away some necrotic tissue and help with more sufficient healing. No fevers, chills, nausea, or vomiting noted at this time. 10/15/18 on evaluation today patient actually appears to be doing very well in regard to his bilateral lower extremities. He's been tolerating the dressing changes without complication. Fortunately there does not appear to be any evidence of active infection at this time which is great news. Overall I'm actually very pleased with how this has progressed in just one visits time. Readmission: 08/14/2020 upon evaluation today patient presents for re-evaluation here in our clinic. He is having issues with his left ankle region as well as his right toe and his right heel. He tells me that the toe and heel actually began as a area that was itching that he was scratching and then subsequently opened up into wounds. These may have been abscess areas I presume based on what I am seeing currently. With regard to his left ankle region he tells me this was a similar type JOHATHON, OVERTURF ([id number]) [id number] Page 2 of 17 occurrence although he does have venous stasis this very well may be more of a venous leg ulcer more than anything. Nonetheless I do believe that the patient would benefit from appropriate and aggressive wound care to try to help get things under better control here. He does have history of a stroke on the left side affecting him to some degree there that he is able to stand although he does have some residual weakness. Otherwise again the patient does have chronic venous insufficiency as previously noted. His arterial studies most recently obtained showed that he had an ABI on the right of 1.16 with a TBI of 0.52 and on the left and ABI of 1.14 with  Chad [name], Chad [name] ([id number]) [id number] Page 1 of 17 Visit Report for 05/01/2023 Chief Complaint Document Details Patient Name: Date of Service: Chad [name], Chad [name] 05/01/2023 10:45 A M Medical Record Number: [id number] Patient Account Number: [id number] Date of Birth/Sex: Treating RN: 12/30/1960 (62 y.o. [name] Primary Care Provider: [name] Other Clinician: Referring Provider: Treating Provider/Extender: [name] Weeks in Treatment: 85 Information Obtained from: Patient Chief Complaint Left ankle ulcer, Left dorsal foot wound, right heel Electronic Signature(s) Signed: 05/01/2023 11:02:29 AM By: [name] PA-C Entered By: [name] on 05/01/2023 08:02:28 -------------------------------------------------------------------------------- HPI Details Patient Name: Date of Service: Chad [name] 05/01/2023 10:45 A M Medical Record Number: [id number] Patient Account Number: [id number] Date of Birth/Sex: Treating RN: 1961/07/09 (62 y.o. [name] Primary Care Provider: [name] Other Clinician: Referring Provider: Treating Provider/Extender: [name] in Treatment: 63 History of Present Illness HPI Description: 10/08/18 on evaluation today patient actually presents to our office for initial evaluation concerning wounds that he has of the bilateral lower extremities. 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 He also has bilateral venous stasis. He apparently has been homeless currently following discharge from the hospital apparently he has been placed at [facility] which is is a skilled nursing facility locally. Nonetheless fortunately he does not show any signs of infection at this time which is good news. In fact several of the wound  actually appears to be showing some signs of improvement already in my pinion. There are a couple areas in the left leg in particular there likely gonna require some sharp debridement to help clear away some necrotic tissue and help with more sufficient healing. No fevers, chills, nausea, or vomiting noted at this time. 10/15/18 on evaluation today patient actually appears to be doing very well in regard to his bilateral lower extremities. He's been tolerating the dressing changes without complication. Fortunately there does not appear to be any evidence of active infection at this time which is great news. Overall I'm actually very pleased with how this has progressed in just one visits time. Readmission: 08/14/2020 upon evaluation today patient presents for re-evaluation here in our clinic. He is having issues with his left ankle region as well as his right toe and his right heel. He tells me that the toe and heel actually began as a area that was itching that he was scratching and then subsequently opened up into wounds. These may have been abscess areas I presume based on what I am seeing currently. With regard to his left ankle region he tells me this was a similar type JOHATHON, OVERTURF ([id number]) [id number] Page 2 of 17 occurrence although he does have venous stasis this very well may be more of a venous leg ulcer more than anything. Nonetheless I do believe that the patient would benefit from appropriate and aggressive wound care to try to help get things under better control here. He does have history of a stroke on the left side affecting him to some degree there that he is able to stand although he does have some residual weakness. Otherwise again the patient does have chronic venous insufficiency as previously noted. 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 No doubt this has a lot to do with the discomfort in the right leg he describes when he is in bed. They have been using Xeroform on the wounds on the right. He has a lot of swelling in this leg with weeping edema 04-09-2023 upon evaluation today patient appears to be doing poorly still in regard to his right leg. 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  Fortunately I do not see any signs of active infection locally or systemically at this time which is great news. No fevers, chills, nausea, vomiting, or diarrhea. With that being said I am concerned still about the fact that he seems to be getting worse especially in regard to the heels I think that he is still sitting up most of the day which I think is still causing some swelling of his leg unfortunately. 04-16-2023 upon evaluation today patient actually appears to be doing little bit more poorly compared to last week this actually does appear to be infected. Last week I really did not think that was the case but this seems like it may indeed be the case. Fortunately I do not see any signs of active infection locally or systemically at this time. 04-24-2023 upon evaluation today patient appears to be doing well currently in regard to his leg compared to where we were previous I do feel like that the antibiotics have been of benefit. Fortunately I do not see any signs of active infection at this time which is great news and in general I believe that we are making headway towards healing which is great news as well. With that being said he still has a lot of drainage I really feel like something like Anola Gurney would be better for him than just an ABD pad it would likely the drainage more or even Zetuvit's but I am not sure whether we can get either 1 of these at the facility for him. 05-01-2023 upon evaluation today patient appears to be doing well currently in regard to his wounds which are showing signs of significant improvement is not any way shape or form infected like it was last time I saw him. I think the antibiotics are doing a good job but I think he may need an extension here to ensure [place], YARBRO ([id number]) [id number] Page 6 of 17 that it completely clears. Electronic Signature(s) Signed: 05/01/2023 11:14:55 AM By: [name] PA-C Entered By: [name] on  05/01/2023 08:14:54 -------------------------------------------------------------------------------- Physical Exam Details Patient Name: Date of Service: Chad [name], Chad [name] 05/01/2023 10:45 A M Medical Record Number: [id number] Patient Account Number: [id number] Date of Birth/Sex: Treating RN: 05/12/1961 (62 y.o. [name] Primary Care Provider: [name] Other Clinician: Referring Provider: Treating Provider/Extender: [name] Weeks in Treatment: 52 Constitutional Well-nourished and well-hydrated in no acute distress. Respiratory normal breathing without difficulty. Psychiatric this patient is able to make decisions and demonstrates good insight into disease process. Alert and Oriented x 3. pleasant and cooperative. Notes Upon inspection patient's wound bed actually showed signs of good granulation and some areas there was still some slough and biofilm he still having a lot of pain and quite a bit of drainage. For that reason I do think that it would be good for [place] to go ahead and see about extension of the antibiotics for him he is in agreement with that plan. 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 02/19/2023 No Yes foot 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, stage 3 10/07/2022 No Yes CAINEN, BURNHAM ([id number]) [id number] Page 10 of 17 Inactive Problems ICD-10 Code Description Active Date Inactive Date L97.322 Non-pressure chronic ulcer of left ankle with fat layer exposed 12/06/2021 12/06/2021 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 [name] Primary Care Provider: [name] Other Clinician: Referring Provider: Treating Provider/Extender: [name] Weeks in Treatment: 51 Subjective Chief Complaint Information obtained from Patient Left ankle ulcer, Left dorsal foot wound, right heel History of Present Illness (HPI) 10/08/18 on evaluation today patient actually presents to our office for initial evaluation concerning wounds that he has of the bilateral lower extremities. 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 He also has bilateral venous stasis. He apparently has been homeless currently following discharge from the hospital apparently he has been placed at [facility] which is is a skilled nursing facility locally. Nonetheless fortunately he does not show any signs of infection at this time which is good news. In fact several of the wound actually appears to be showing some signs of improvement already in my pinion. There are a couple areas in the left leg in particular there likely gonna require some sharp debridement to help clear away some necrotic tissue and help with more sufficient healing. No fevers, chills, nausea, or vomiting noted at this time. 10/15/18 on evaluation today patient actually appears to be doing very well in regard to his bilateral lower extremities. He's been tolerating the dressing changes without complication. Fortunately there does not appear to be any evidence of active infection at this time which is great news. Overall I'm actually very pleased with how this has progressed in just one visits time. Readmission: 08/14/2020 upon evaluation today patient presents for re-evaluation here in our clinic. He is having issues with his left ankle region as well as his right toe and his right heel. He tells me that the toe and heel actually began as a area that was itching that he was scratching and then subsequently opened up into wounds. These may have been abscess areas I presume based on what I am seeing currently. With regard to his left ankle region he tells me this was a similar type occurrence although he does have venous stasis this very well may be more of a venous leg ulcer more than anything. Nonetheless I do believe that the patient would benefit from appropriate and aggressive wound care to try to help get things under better control here. He  place into trash. Cleanse the wound with Wound Cleanser prior to applying a clean dressing using gauze sponges, not tissues or cotton balls. Do not scrub or use excessive force. Pat dry using gauze sponges, not tissue or cotton balls. Prim Dressing: Silvercel Small 2x2 (in/in) 1 x Per Day/30 Days ary Discharge Instructions: Apply Silvercel Small 2x2 (in/in) as instructed Secondary Dressing: Zetuvit Plus 4x8 (in/in) 1 x Per Day/30 Days Secured With: Kerlix Roll Sterile or Non-Sterile 6-ply 4.5x4 (yd/yd) 1 x Per Day/30 Days Discharge Instructions: Apply Kerlix as directed Secured With: Tubigrip Size D, 3x10 (in/yd) 1 x Per Day/30 Days 1. I would recommend that we have the patient continue to monitor for any signs of infection or worsening. In general I think that he is making really good headway towards closure I am very pleased in that regard. 2. I am going to recommend as well that he should continue with the Tubigrip size D which I think is doing well also using the alginate dressing. 3. Muscle can recommend he should continue to elevate his legs much as possible to more this he can do the better. 4. I am going to go ahead and repeat up with the Levaquin for 2 additional weeks I will get that sent into the pharmacy for him as well he is in agreement with that plan. We will see patient back for reevaluation in 1 week here in the clinic. If anything worsens or changes patient will contact our office for additional recommendations. Electronic Signature(s) Signed: 05/01/2023 11:19:01 AM By: [name] PA-C Previous Signature:  05/01/2023 11:16:16 AM Version By: [name] PA-C Entered By: [name] on 05/01/2023 08:19:00 -------------------------------------------------------------------------------- SuperBill Details Patient Name: Date of Service: Chad [name], Chad [name] 05/01/2023 Medical Record Number: [id number] Patient Account Number: [id number] Date of Birth/Sex: Treating RN: 1960/12/17 (62 y.o. 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, stage 3 Facility Procedures : CPT4 Code: 41324401 Description: 02725 - WOUND CARE VISIT-LEV 5 EST PT Modifier: Quantity: 1 Physician Procedures : CPT4 Code Description Modifier [id number] - WC PHYS LEVEL 4 - EST PT ICD-10 Diagnosis Description 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 (idiopathic) with ulcer and inflammation of left lower extremity L98.492 Non-pressure chronic ulcer of skin of  other sites with fat layer exposed 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 Quantity: 1 Electronic Signature(s) Unsigned Previous Signature: 05/01/2023 11:16:51 AM Version By: [name] PA-C Entered By: Midge Aver on 05/01/2023 08:31:14 Signature(s): Date(s):  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 No doubt this has a lot to do with the discomfort in the right leg he describes when he is in bed. They have been using Xeroform on the wounds on the right. He has a lot of swelling in this leg with weeping edema 04-09-2023 upon evaluation today patient appears to be doing poorly still in regard to his right leg. 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  Fortunately I do not see any signs of active infection locally or systemically at this time which is great news. No fevers, chills, nausea, vomiting, or diarrhea. With that being said I am concerned still about the fact that he seems to be getting worse especially in regard to the heels I think that he is still sitting up most of the day which I think is still causing some swelling of his leg unfortunately. 04-16-2023 upon evaluation today patient actually appears to be doing little bit more poorly compared to last week this actually does appear to be infected. Last week I really did not think that was the case but this seems like it may indeed be the case. Fortunately I do not see any signs of active infection locally or systemically at this time. 04-24-2023 upon evaluation today patient appears to be doing well currently in regard to his leg compared to where we were previous I do feel like that the antibiotics have been of benefit. Fortunately I do not see any signs of active infection at this time which is great news and in general I believe that we are making headway towards healing which is great news as well. With that being said he still has a lot of drainage I really feel like something like Anola Gurney would be better for him than just an ABD pad it would likely the drainage more or even Zetuvit's but I am not sure whether we can get either 1 of these at the facility for him. 05-01-2023 upon evaluation today patient appears to be doing well currently in regard to his wounds which are showing signs of significant improvement is not any way shape or form infected like it was last time I saw him. I think the antibiotics are doing a good job but I think he may need an extension here to ensure [place], [place] ([id number]) [id number] Page 6 of 17 that it completely clears. Electronic Signature(s) Signed: 05/01/2023 11:14:55 AM By: [name] PA-C Entered By: [name] on  05/01/2023 08:14:54 -------------------------------------------------------------------------------- Physical Exam Details Patient Name: Date of Service: Chad [name], Chad [name] 05/01/2023 10:45 A M Medical Record Number: [id number] Patient Account Number: [id number] Date of Birth/Sex: Treating RN: 05/12/1961 (62 y.o. [name] Primary Care Provider: [name] Other Clinician: Referring Provider: Treating Provider/Extender: [name] Weeks in Treatment: 52 Constitutional Well-nourished and well-hydrated in no acute distress. Respiratory normal breathing without difficulty. Psychiatric this patient is able to make decisions and demonstrates good insight into disease process. Alert and Oriented x 3. pleasant and cooperative. Notes Upon inspection patient's wound bed actually showed signs of good granulation and some areas there was still some slough and biofilm he still having a lot of pain and quite a bit of drainage. For that reason I do think that it would be good for [place] to go ahead and see about extension of the antibiotics for him he is in agreement with that plan. 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 No doubt this has a lot to do with the discomfort in the right leg he describes when he is in bed. They have been using Xeroform on the wounds on the right. He has a lot of swelling in this leg with weeping edema 04-09-2023 upon evaluation today patient appears to be doing poorly still in regard to his right leg. 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  Fortunately I do not see any signs of active infection locally or systemically at this time which is great news. No fevers, chills, nausea, vomiting, or diarrhea. With that being said I am concerned still about the fact that he seems to be getting worse especially in regard to the heels I think that he is still sitting up most of the day which I think is still causing some swelling of his leg unfortunately. 04-16-2023 upon evaluation today patient actually appears to be doing little bit more poorly compared to last week this actually does appear to be infected. Last week I really did not think that was the case but this seems like it may indeed be the case. Fortunately I do not see any signs of active infection locally or systemically at this time. 04-24-2023 upon evaluation today patient appears to be doing well currently in regard to his leg compared to where we were previous I do feel like that the antibiotics have been of benefit. Fortunately I do not see any signs of active infection at this time which is great news and in general I believe that we are making headway towards healing which is great news as well. With that being said he still has a lot of drainage I really feel like something like Anola Gurney would be better for him than just an ABD pad it would likely the drainage more or even Zetuvit's but I am not sure whether we can get either 1 of these at the facility for him. 05-01-2023 upon evaluation today patient appears to be doing well currently in regard to his wounds which are showing signs of significant improvement is not any way shape or form infected like it was last time I saw him. I think the antibiotics are doing a good job but I think he may need an extension here to ensure [place], [place] ([id number]) [id number] Page 6 of 17 that it completely clears. Electronic Signature(s) Signed: 05/01/2023 11:14:55 AM By: [name] PA-C Entered By: [name] on  05/01/2023 08:14:54 -------------------------------------------------------------------------------- Physical Exam Details Patient Name: Date of Service: Chad [name], Chad [name] 05/01/2023 10:45 A M Medical Record Number: [id number] Patient Account Number: [id number] Date of Birth/Sex: Treating RN: 05/12/1961 (62 y.o. [name] Primary Care Provider: [name] Other Clinician: Referring Provider: Treating Provider/Extender: [name] Weeks in Treatment: 52 Constitutional Well-nourished and well-hydrated in no acute distress. Respiratory normal breathing without difficulty. Psychiatric this patient is able to make decisions and demonstrates good insight into disease process. Alert and Oriented x 3. pleasant and cooperative. Notes Upon inspection patient's wound bed actually showed signs of good granulation and some areas there was still some slough and biofilm he still having a lot of pain and quite a bit of drainage. For that reason I do think that it would be good for [place] to go ahead and see about extension of the antibiotics for him he is in agreement with that plan. Electronic Signature(s)  place into trash. Cleanse the wound with Wound Cleanser prior to applying a clean dressing using gauze sponges, not tissues or cotton balls. Do not scrub or use excessive force. Pat dry using gauze sponges, not tissue or cotton balls. Prim Dressing: Silvercel Small 2x2 (in/in) 1 x Per Day/30 Days ary Discharge Instructions: Apply Silvercel Small 2x2 (in/in) as instructed Secondary Dressing: Zetuvit Plus 4x8 (in/in) 1 x Per Day/30 Days Secured With: Kerlix Roll Sterile or Non-Sterile 6-ply 4.5x4 (yd/yd) 1 x Per Day/30 Days Discharge Instructions: Apply Kerlix as directed Secured With: Tubigrip Size D, 3x10 (in/yd) 1 x Per Day/30 Days 1. I would recommend that we have the patient continue to monitor for any signs of infection or worsening. In general I think that he is making really good headway towards closure I am very pleased in that regard. 2. I am going to recommend as well that he should continue with the Tubigrip size D which I think is doing well also using the alginate dressing. 3. Muscle can recommend he should continue to elevate his legs much as possible to more this he can do the better. 4. I am going to go ahead and repeat up with the Levaquin for 2 additional weeks I will get that sent into the pharmacy for him as well he is in agreement with that plan. We will see patient back for reevaluation in 1 week here in the clinic. If anything worsens or changes patient will contact our office for additional recommendations. Electronic Signature(s) Signed: 05/01/2023 11:19:01 AM By: [name] PA-C Previous Signature:  05/01/2023 11:16:16 AM Version By: [name] PA-C Entered By: [name] on 05/01/2023 08:19:00 -------------------------------------------------------------------------------- SuperBill Details Patient Name: Date of Service: Chad [name], Chad [name] 05/01/2023 Medical Record Number: [id number] Patient Account Number: [id number] Date of Birth/Sex: Treating RN: 1960/12/17 (62 y.o. 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, stage 3 Facility Procedures : CPT4 Code: 41324401 Description: 02725 - WOUND CARE VISIT-LEV 5 EST PT Modifier: Quantity: 1 Physician Procedures : CPT4 Code Description Modifier [id number] 99214 - WC PHYS LEVEL 4 - EST PT ICD-10 Diagnosis Description 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 (idiopathic) with ulcer and inflammation of left lower extremity L98.492 Non-pressure chronic ulcer of skin of  other sites with fat layer exposed 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 Quantity: 1 Electronic Signature(s) Unsigned Previous Signature: 05/01/2023 11:16:51 AM Version By: [name] PA-C Entered By: Midge Aver on 05/01/2023 08:31:14 Signature(s): Date(s):  Apply Kerlix as directed Secured With: Tubigrip Size D, 3x10 (in/yd) 1 x Per Day/30 Days Wound #21 - Foot Wound Laterality: Dorsal, Right Cleanser: Soap and Water 1 x Per Day/30 Days Discharge Instructions: Gently cleanse wound with antibacterial soap, rinse and pat dry prior to dressing wounds [name] ([id number]) [id number] Page 8 of 17 Cleanser: Wound Cleanser 1 x Per Day/30 Days Discharge Instructions: Wash your hands with soap and water. Remove old dressing, discard into plastic bag and place into trash. Cleanse the wound with Wound Cleanser prior to applying a clean dressing using gauze sponges, not tissues or cotton balls. Do not scrub or use excessive force. Pat dry using gauze  sponges, not tissue or cotton balls. Prim Dressing: Silvercel Small 2x2 (in/in) 1 x Per Day/30 Days ary Discharge Instructions: Apply Silvercel Small 2x2 (in/in) as instructed Secondary Dressing: Zetuvit Plus 4x8 (in/in) 1 x Per Day/30 Days Secured With: Kerlix Roll Sterile or Non-Sterile 6-ply 4.5x4 (yd/yd) 1 x Per Day/30 Days Discharge Instructions: Apply Kerlix as directed Secured With: Tubigrip Size D, 3x10 (in/yd) 1 x Per Day/30 Days Wound #22 - Lower Leg Wound Laterality: Right, Anterior Cleanser: Soap and Water 1 x Per Day/30 Days Discharge Instructions: Gently cleanse wound with antibacterial soap, rinse and pat dry prior to dressing wounds Cleanser: Wound Cleanser 1 x Per Day/30 Days Discharge Instructions: Wash your hands with soap and water. Remove old dressing, discard into plastic bag and place into trash. Cleanse the wound with Wound Cleanser prior to applying a clean dressing using gauze sponges, not tissues or cotton balls. Do not scrub or use excessive force. Pat dry using gauze sponges, not tissue or cotton balls. Prim Dressing: Silvercel Small 2x2 (in/in) 1 x Per Day/30 Days ary Discharge Instructions: Apply Silvercel Small 2x2 (in/in) as instructed Secondary Dressing: Zetuvit Plus 4x8 (in/in) 1 x Per Day/30 Days Secured With: Kerlix Roll Sterile or Non-Sterile 6-ply 4.5x4 (yd/yd) 1 x Per Day/30 Days Discharge Instructions: Apply Kerlix as directed Secured With: Tubigrip Size D, 3x10 (in/yd) 1 x Per Day/30 Days Patient Medications llergies: penicillin A Notifications Medication Indication Start End 05/01/2023 levofloxacin DOSE 1 - oral 500 mg tablet - 1 tablet oral once daily x 14 days. Do not take calcium or multivitamin with this medication Electronic Signature(s) Signed: 05/01/2023 11:18:49 AM By: [name] PA-C Entered By: [name] on 05/01/2023 08:18:47 Prescription  05/01/2023 -------------------------------------------------------------------------------- Melvia Heaps PA-C Patient Name: Provider: Jul 27, 1961 [id number] Date of Birth: NPI#Geoffery Spruce Sex: DEA #: [phone] Phone #: License #: UPN: Patient Address: [address] [facility] [place], [place] [address] [facility] [address] [place], [place] [address] [phone] Chad [name], Chad [name] ([id number]) [id number] Page 9 of 17 Allergies penicillin Medication Medication: Route: Strength: Form: levofloxacin oral 500 mg tablet Class: QUINOLONE ANTIBIOTICS Dose: Frequency / Time: Indication: 1 1 tablet oral once daily x 14 days. Do not take calcium or multivitamin with this medication Number of Refills: Number of Units: 0 Fourteen (14) Tablet(s) Generic Substitution: Start Date: End Date: Administered at Facility: Substitution Permitted 05/01/2023 No Note to Pharmacy: Hand Signature: Date(s): Electronic Signature(s) Signed: 05/01/2023 11:19:32 AM By: [name] PA-C Entered By: [name] on 05/01/2023 08:18:49 -------------------------------------------------------------------------------- Problem List Details Patient Name: Date of Service: Chad [name] 05/01/2023 10:45 A M Medical Record Number: [id number] Patient Account Number: [id number] Date of Birth/Sex: Treating RN: Feb 06, 1961 (62 y.o. [name] Primary Care Provider: [name] Other Clinician: Referring Provider:  NOACH, Chad [name] ([id number]) [id number] Page 1 of 17 Visit Report for 05/01/2023 Chief Complaint Document Details Patient Name: Date of Service: Chad [name], Chad [name] 05/01/2023 10:45 A M Medical Record Number: [id number] Patient Account Number: [id number] Date of Birth/Sex: Treating RN: 12/30/1960 (62 y.o. [name] Primary Care Provider: [name] Other Clinician: Referring Provider: Treating Provider/Extender: [name] Weeks in Treatment: 85 Information Obtained from: Patient Chief Complaint Left ankle ulcer, Left dorsal foot wound, right heel Electronic Signature(s) Signed: 05/01/2023 11:02:29 AM By: [name] PA-C Entered By: [name] on 05/01/2023 08:02:28 -------------------------------------------------------------------------------- HPI Details Patient Name: Date of Service: Chad [name] 05/01/2023 10:45 A M Medical Record Number: [id number] Patient Account Number: [id number] Date of Birth/Sex: Treating RN: 1961/07/09 (62 y.o. [name] Primary Care Provider: [name] Other Clinician: Referring Provider: Treating Provider/Extender: [name] in Treatment: 63 History of Present Illness HPI Description: 10/08/18 on evaluation today patient actually presents to our office for initial evaluation concerning wounds that he has of the bilateral lower extremities. 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 He also has bilateral venous stasis. He apparently has been homeless currently following discharge from the hospital apparently he has been placed at [facility] which is is a skilled nursing facility locally. Nonetheless fortunately he does not show any signs of infection at this time which is good news. In fact several of the wound  actually appears to be showing some signs of improvement already in my pinion. There are a couple areas in the left leg in particular there likely gonna require some sharp debridement to help clear away some necrotic tissue and help with more sufficient healing. No fevers, chills, nausea, or vomiting noted at this time. 10/15/18 on evaluation today patient actually appears to be doing very well in regard to his bilateral lower extremities. He's been tolerating the dressing changes without complication. Fortunately there does not appear to be any evidence of active infection at this time which is great news. Overall I'm actually very pleased with how this has progressed in just one visits time. Readmission: 08/14/2020 upon evaluation today patient presents for re-evaluation here in our clinic. He is having issues with his left ankle region as well as his right toe and his right heel. He tells me that the toe and heel actually began as a area that was itching that he was scratching and then subsequently opened up into wounds. These may have been abscess areas I presume based on what I am seeing currently. With regard to his left ankle region he tells me this was a similar type JOHATHON, OVERTURF ([id number]) [id number] Page 2 of 17 occurrence although he does have venous stasis this very well may be more of a venous leg ulcer more than anything. Nonetheless I do believe that the patient would benefit from appropriate and aggressive wound care to try to help get things under better control here. He does have history of a stroke on the left side affecting him to some degree there that he is able to stand although he does have some residual weakness. Otherwise again the patient does have chronic venous insufficiency as previously noted. His arterial studies most recently obtained showed that he had an ABI on the right of 1.16 with a TBI of 0.52 and on the left and ABI of 1.14 with  place into trash. Cleanse the wound with Wound Cleanser prior to applying a clean dressing using gauze sponges, not tissues or cotton balls. Do not scrub or use excessive force. Pat dry using gauze sponges, not tissue or cotton balls. Prim Dressing: Silvercel Small 2x2 (in/in) 1 x Per Day/30 Days ary Discharge Instructions: Apply Silvercel Small 2x2 (in/in) as instructed Secondary Dressing: Zetuvit Plus 4x8 (in/in) 1 x Per Day/30 Days Secured With: Kerlix Roll Sterile or Non-Sterile 6-ply 4.5x4 (yd/yd) 1 x Per Day/30 Days Discharge Instructions: Apply Kerlix as directed Secured With: Tubigrip Size D, 3x10 (in/yd) 1 x Per Day/30 Days 1. I would recommend that we have the patient continue to monitor for any signs of infection or worsening. In general I think that he is making really good headway towards closure I am very pleased in that regard. 2. I am going to recommend as well that he should continue with the Tubigrip size D which I think is doing well also using the alginate dressing. 3. Muscle can recommend he should continue to elevate his legs much as possible to more this he can do the better. 4. I am going to go ahead and repeat up with the Levaquin for 2 additional weeks I will get that sent into the pharmacy for him as well he is in agreement with that plan. We will see patient back for reevaluation in 1 week here in the clinic. If anything worsens or changes patient will contact our office for additional recommendations. Electronic Signature(s) Signed: 05/01/2023 11:19:01 AM By: [name] PA-C Previous Signature:  05/01/2023 11:16:16 AM Version By: [name] PA-C Entered By: [name] on 05/01/2023 08:19:00 -------------------------------------------------------------------------------- SuperBill Details Patient Name: Date of Service: Chad [name], Chad [name] 05/01/2023 Medical Record Number: [id number] Patient Account Number: [id number] Date of Birth/Sex: Treating RN: 1960/12/17 (62 y.o. 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, stage 3 Facility Procedures : CPT4 Code: 41324401 Description: 02725 - WOUND CARE VISIT-LEV 5 EST PT Modifier: Quantity: 1 Physician Procedures : CPT4 Code Description Modifier [id number] - WC PHYS LEVEL 4 - EST PT ICD-10 Diagnosis Description 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 (idiopathic) with ulcer and inflammation of left lower extremity L98.492 Non-pressure chronic ulcer of skin of  other sites with fat layer exposed 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 Quantity: 1 Electronic Signature(s) Unsigned Previous Signature: 05/01/2023 11:16:51 AM Version By: [name] PA-C Entered By: Midge Aver on 05/01/2023 08:31:14 Signature(s): Date(s):  Apply Kerlix as directed Secured With: Tubigrip Size D, 3x10 (in/yd) 1 x Per Day/30 Days Wound #21 - Foot Wound Laterality: Dorsal, Right Cleanser: Soap and Water 1 x Per Day/30 Days Discharge Instructions: Gently cleanse wound with antibacterial soap, rinse and pat dry prior to dressing wounds [name] ([id number]) [id number] Page 8 of 17 Cleanser: Wound Cleanser 1 x Per Day/30 Days Discharge Instructions: Wash your hands with soap and water. Remove old dressing, discard into plastic bag and place into trash. Cleanse the wound with Wound Cleanser prior to applying a clean dressing using gauze sponges, not tissues or cotton balls. Do not scrub or use excessive force. Pat dry using gauze  sponges, not tissue or cotton balls. Prim Dressing: Silvercel Small 2x2 (in/in) 1 x Per Day/30 Days ary Discharge Instructions: Apply Silvercel Small 2x2 (in/in) as instructed Secondary Dressing: Zetuvit Plus 4x8 (in/in) 1 x Per Day/30 Days Secured With: Kerlix Roll Sterile or Non-Sterile 6-ply 4.5x4 (yd/yd) 1 x Per Day/30 Days Discharge Instructions: Apply Kerlix as directed Secured With: Tubigrip Size D, 3x10 (in/yd) 1 x Per Day/30 Days Wound #22 - Lower Leg Wound Laterality: Right, Anterior Cleanser: Soap and Water 1 x Per Day/30 Days Discharge Instructions: Gently cleanse wound with antibacterial soap, rinse and pat dry prior to dressing wounds Cleanser: Wound Cleanser 1 x Per Day/30 Days Discharge Instructions: Wash your hands with soap and water. Remove old dressing, discard into plastic bag and place into trash. Cleanse the wound with Wound Cleanser prior to applying a clean dressing using gauze sponges, not tissues or cotton balls. Do not scrub or use excessive force. Pat dry using gauze sponges, not tissue or cotton balls. Prim Dressing: Silvercel Small 2x2 (in/in) 1 x Per Day/30 Days ary Discharge Instructions: Apply Silvercel Small 2x2 (in/in) as instructed Secondary Dressing: Zetuvit Plus 4x8 (in/in) 1 x Per Day/30 Days Secured With: Kerlix Roll Sterile or Non-Sterile 6-ply 4.5x4 (yd/yd) 1 x Per Day/30 Days Discharge Instructions: Apply Kerlix as directed Secured With: Tubigrip Size D, 3x10 (in/yd) 1 x Per Day/30 Days Patient Medications llergies: penicillin A Notifications Medication Indication Start End 05/01/2023 levofloxacin DOSE 1 - oral 500 mg tablet - 1 tablet oral once daily x 14 days. Do not take calcium or multivitamin with this medication Electronic Signature(s) Signed: 05/01/2023 11:18:49 AM By: [name] PA-C Entered By: [name] on 05/01/2023 08:18:47 Prescription  05/01/2023 -------------------------------------------------------------------------------- Melvia Heaps PA-C Patient Name: Provider: Jul 27, 1961 [id number] Date of Birth: NPI#Geoffery Spruce Sex: DEA #: [phone] Phone #: License #: UPN: Patient Address: [address] [facility] [place], [place] [address] [facility] [address] [place], [place] [address] [phone] Chad [name], Chad [name] ([id number]) [id number] Page 9 of 17 Allergies penicillin Medication Medication: Route: Strength: Form: levofloxacin oral 500 mg tablet Class: QUINOLONE ANTIBIOTICS Dose: Frequency / Time: Indication: 1 1 tablet oral once daily x 14 days. Do not take calcium or multivitamin with this medication Number of Refills: Number of Units: 0 Fourteen (14) Tablet(s) Generic Substitution: Start Date: End Date: Administered at Facility: Substitution Permitted 05/01/2023 No Note to Pharmacy: Hand Signature: Date(s): Electronic Signature(s) Signed: 05/01/2023 11:19:32 AM By: [name] PA-C Entered By: [name] on 05/01/2023 08:18:49 -------------------------------------------------------------------------------- Problem List Details Patient Name: Date of Service: Chad [name] 05/01/2023 10:45 A M Medical Record Number: [id number] Patient Account Number: [id number] Date of Birth/Sex: Treating RN: Feb 06, 1961 (62 y.o. [name] Primary Care Provider: [name] Other Clinician: Referring Provider:  Signed: 05/01/2023 11:15:13 AM By: [name] PA-C Entered By: [name] on 05/01/2023 08:15:13 -------------------------------------------------------------------------------- Physician Orders Details Patient Name: Date of Service: Chad [name] 05/01/2023 10:45 A M Medical Record Number: [id number] Patient Account Number: [id number] Date of Birth/Sex: Treating RN: 08-Sep-1960 (62 y.o. 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 Nurse Visit as needed Bathing/ Shower/ Hygiene Clean wound with Normal Saline or wound cleanser. No tub bath. Anesthetic (Use 'Patient Medications' Section for Anesthetic Order Entry) Lidocaine applied to wound bed Edema Control - Lymphedema / Segmental Compressive Device / Other Tubigrip single layer applied. - Size D Elevate legs to the level of the heart and pump ankles as often as possible Elevate leg(s) parallel to the floor when sitting. Other: - PATIENT MUST SLEEP IN A BED. Legs are swelling due to no elevation of legs. 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 ntibiotics - RX renewed 05/01/2023 P.O. 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 Remove old dressing, discard into plastic bag and place into trash. Cleanse the wound with Wound Cleanser prior to applying a clean dressing using gauze sponges, not tissues or cotton balls. Do not scrub or use excessive force. Pat dry using gauze sponges, not tissue or cotton balls. Prim Dressing: Silvercel Small 2x2 (in/in) 1 x Per Day/30 Days ary Discharge Instructions: Apply Silvercel Small 2x2 (in/in) as instructed Secondary Dressing: Zetuvit Plus 4x8 (in/in) 1  x Per Day/30 Days Secondary Dressing: Heel Cup 1 x Per Day/30 Days Secured With: Kerlix Roll Sterile or Non-Sterile 6-ply 4.5x4 (yd/yd) 1 x Per Day/30 Days Discharge Instructions: Apply Kerlix as directed Secured With: Tubigrip Size D, 3x10 (in/yd) 1 x Per Day/30 Days Wound #18 - T Second oe Wound Laterality: Right, Medial Cleanser: Soap and Water 1 x Per Day/30 Days Discharge Instructions: Gently cleanse wound with antibacterial soap, rinse and pat dry prior to dressing wounds Cleanser: Wound Cleanser 1 x Per Day/30 Days Discharge Instructions: Wash your hands with soap and water. Remove old dressing, discard into plastic bag and place into trash. Cleanse the wound with Wound Cleanser prior to applying a clean dressing using gauze sponges, not tissues or cotton balls. Do not scrub or use excessive force. Pat dry using gauze sponges, not tissue or cotton balls. Prim Dressing: Silvercel Small 2x2 (in/in) 1 x Per Day/30 Days ary Discharge Instructions: Apply Silvercel Small 2x2 (in/in) as instructed Secondary Dressing: Zetuvit Plus 4x8 (in/in) 1 x Per Day/30 Days Secured With: Kerlix Roll Sterile or Non-Sterile 6-ply 4.5x4 (yd/yd) 1 x Per Day/30 Days Discharge Instructions:  place into trash. Cleanse the wound with Wound Cleanser prior to applying a clean dressing using gauze sponges, not tissues or cotton balls. Do not scrub or use excessive force. Pat dry using gauze sponges, not tissue or cotton balls. Prim Dressing: Silvercel Small 2x2 (in/in) 1 x Per Day/30 Days ary Discharge Instructions: Apply Silvercel Small 2x2 (in/in) as instructed Secondary Dressing: Zetuvit Plus 4x8 (in/in) 1 x Per Day/30 Days Secured With: Kerlix Roll Sterile or Non-Sterile 6-ply 4.5x4 (yd/yd) 1 x Per Day/30 Days Discharge Instructions: Apply Kerlix as directed Secured With: Tubigrip Size D, 3x10 (in/yd) 1 x Per Day/30 Days 1. I would recommend that we have the patient continue to monitor for any signs of infection or worsening. In general I think that he is making really good headway towards closure I am very pleased in that regard. 2. I am going to recommend as well that he should continue with the Tubigrip size D which I think is doing well also using the alginate dressing. 3. Muscle can recommend he should continue to elevate his legs much as possible to more this he can do the better. 4. I am going to go ahead and repeat up with the Levaquin for 2 additional weeks I will get that sent into the pharmacy for him as well he is in agreement with that plan. We will see patient back for reevaluation in 1 week here in the clinic. If anything worsens or changes patient will contact our office for additional recommendations. Electronic Signature(s) Signed: 05/01/2023 11:19:01 AM By: [name] PA-C Previous Signature:  05/01/2023 11:16:16 AM Version By: [name] PA-C Entered By: [name] on 05/01/2023 08:19:00 -------------------------------------------------------------------------------- SuperBill Details Patient Name: Date of Service: Chad [name], Chad [name] 05/01/2023 Medical Record Number: [id number] Patient Account Number: [id number] Date of Birth/Sex: Treating RN: 1960/12/17 (62 y.o. 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, stage 3 Facility Procedures : CPT4 Code: 41324401 Description: 02725 - WOUND CARE VISIT-LEV 5 EST PT Modifier: Quantity: 1 Physician Procedures : CPT4 Code Description Modifier [id number] - WC PHYS LEVEL 4 - EST PT ICD-10 Diagnosis Description 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 (idiopathic) with ulcer and inflammation of left lower extremity L98.492 Non-pressure chronic ulcer of skin of  other sites with fat layer exposed 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 Quantity: 1 Electronic Signature(s) Unsigned Previous Signature: 05/01/2023 11:16:51 AM Version By: [name] PA-C Entered By: Midge Aver on 05/01/2023 08:31:14 Signature(s): Date(s):  Apply Kerlix as directed Secured With: Tubigrip Size D, 3x10 (in/yd) 1 x Per Day/30 Days Wound #21 - Foot Wound Laterality: Dorsal, Right Cleanser: Soap and Water 1 x Per Day/30 Days Discharge Instructions: Gently cleanse wound with antibacterial soap, rinse and pat dry prior to dressing wounds [name] ([id number]) [id number] Page 8 of 17 Cleanser: Wound Cleanser 1 x Per Day/30 Days Discharge Instructions: Wash your hands with soap and water. Remove old dressing, discard into plastic bag and place into trash. Cleanse the wound with Wound Cleanser prior to applying a clean dressing using gauze sponges, not tissues or cotton balls. Do not scrub or use excessive force. Pat dry using gauze  sponges, not tissue or cotton balls. Prim Dressing: Silvercel Small 2x2 (in/in) 1 x Per Day/30 Days ary Discharge Instructions: Apply Silvercel Small 2x2 (in/in) as instructed Secondary Dressing: Zetuvit Plus 4x8 (in/in) 1 x Per Day/30 Days Secured With: Kerlix Roll Sterile or Non-Sterile 6-ply 4.5x4 (yd/yd) 1 x Per Day/30 Days Discharge Instructions: Apply Kerlix as directed Secured With: Tubigrip Size D, 3x10 (in/yd) 1 x Per Day/30 Days Wound #22 - Lower Leg Wound Laterality: Right, Anterior Cleanser: Soap and Water 1 x Per Day/30 Days Discharge Instructions: Gently cleanse wound with antibacterial soap, rinse and pat dry prior to dressing wounds Cleanser: Wound Cleanser 1 x Per Day/30 Days Discharge Instructions: Wash your hands with soap and water. Remove old dressing, discard into plastic bag and place into trash. Cleanse the wound with Wound Cleanser prior to applying a clean dressing using gauze sponges, not tissues or cotton balls. Do not scrub or use excessive force. Pat dry using gauze sponges, not tissue or cotton balls. Prim Dressing: Silvercel Small 2x2 (in/in) 1 x Per Day/30 Days ary Discharge Instructions: Apply Silvercel Small 2x2 (in/in) as instructed Secondary Dressing: Zetuvit Plus 4x8 (in/in) 1 x Per Day/30 Days Secured With: Kerlix Roll Sterile or Non-Sterile 6-ply 4.5x4 (yd/yd) 1 x Per Day/30 Days Discharge Instructions: Apply Kerlix as directed Secured With: Tubigrip Size D, 3x10 (in/yd) 1 x Per Day/30 Days Patient Medications llergies: penicillin A Notifications Medication Indication Start End 05/01/2023 levofloxacin DOSE 1 - oral 500 mg tablet - 1 tablet oral once daily x 14 days. Do not take calcium or multivitamin with this medication Electronic Signature(s) Signed: 05/01/2023 11:18:49 AM By: [name] PA-C Entered By: [name] on 05/01/2023 08:18:47 Prescription  05/01/2023 -------------------------------------------------------------------------------- Melvia Heaps PA-C Patient Name: Provider: Jul 27, 1961 [id number] Date of Birth: NPI#Geoffery Spruce Sex: DEA #: [phone] Phone #: License #: UPN: Patient Address: [address] [facility] [place], [place] [address] [facility] [address] [place], [place] [address] [phone] Chad [name], Chad [name] ([id number]) [id number] Page 9 of 17 Allergies penicillin Medication Medication: Route: Strength: Form: levofloxacin oral 500 mg tablet Class: QUINOLONE ANTIBIOTICS Dose: Frequency / Time: Indication: 1 1 tablet oral once daily x 14 days. Do not take calcium or multivitamin with this medication Number of Refills: Number of Units: 0 Fourteen (14) Tablet(s) Generic Substitution: Start Date: End Date: Administered at Facility: Substitution Permitted 05/01/2023 No Note to Pharmacy: Hand Signature: Date(s): Electronic Signature(s) Signed: 05/01/2023 11:19:32 AM By: [name] PA-C Entered By: [name] on 05/01/2023 08:18:49 -------------------------------------------------------------------------------- Problem List Details Patient Name: Date of Service: Chad [name] 05/01/2023 10:45 A M Medical Record Number: [id number] Patient Account Number: [id number] Date of Birth/Sex: Treating RN: Feb 06, 1961 (62 y.o. [name] Primary Care Provider: [name] Other Clinician: Referring Provider:  place into trash. Cleanse the wound with Wound Cleanser prior to applying a clean dressing using gauze sponges, not tissues or cotton balls. Do not scrub or use excessive force. Pat dry using gauze sponges, not tissue or cotton balls. Prim Dressing: Silvercel Small 2x2 (in/in) 1 x Per Day/30 Days ary Discharge Instructions: Apply Silvercel Small 2x2 (in/in) as instructed Secondary Dressing: Zetuvit Plus 4x8 (in/in) 1 x Per Day/30 Days Secured With: Kerlix Roll Sterile or Non-Sterile 6-ply 4.5x4 (yd/yd) 1 x Per Day/30 Days Discharge Instructions: Apply Kerlix as directed Secured With: Tubigrip Size D, 3x10 (in/yd) 1 x Per Day/30 Days 1. I would recommend that we have the patient continue to monitor for any signs of infection or worsening. In general I think that he is making really good headway towards closure I am very pleased in that regard. 2. I am going to recommend as well that he should continue with the Tubigrip size D which I think is doing well also using the alginate dressing. 3. Muscle can recommend he should continue to elevate his legs much as possible to more this he can do the better. 4. I am going to go ahead and repeat up with the Levaquin for 2 additional weeks I will get that sent into the pharmacy for him as well he is in agreement with that plan. We will see patient back for reevaluation in 1 week here in the clinic. If anything worsens or changes patient will contact our office for additional recommendations. Electronic Signature(s) Signed: 05/01/2023 11:19:01 AM By: [name] PA-C Previous Signature:  05/01/2023 11:16:16 AM Version By: [name] PA-C Entered By: [name] on 05/01/2023 08:19:00 -------------------------------------------------------------------------------- SuperBill Details Patient Name: Date of Service: Chad [name], Chad [name] 05/01/2023 Medical Record Number: [id number] Patient Account Number: [id number] Date of Birth/Sex: Treating RN: 1960/12/17 (62 y.o. 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, stage 3 Facility Procedures : CPT4 Code: 41324401 Description: 02725 - WOUND CARE VISIT-LEV 5 EST PT Modifier: Quantity: 1 Physician Procedures : CPT4 Code Description Modifier [id number] 99214 - WC PHYS LEVEL 4 - EST PT ICD-10 Diagnosis Description 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 (idiopathic) with ulcer and inflammation of left lower extremity L98.492 Non-pressure chronic ulcer of skin of  other sites with fat layer exposed 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 Cleanse the wound with Wound Cleanser prior to applying a clean dressing using gauze sponges, not tissues or cotton balls. Do not scrub or use excessive force. Pat dry using gauze sponges, not tissue or cotton balls. Prim Dressing: Silvercel Small 2x2 (in/in) 1 x Per Day/30 Days ary Discharge Instructions: Apply Silvercel Small 2x2 (in/in) as instructed Secondary Dressing: Zetuvit Plus 4x8 (in/in) 1 x Per Day/30 Days Secured With: Kerlix Roll Sterile or Non-Sterile 6-ply 4.5x4 (yd/yd) 1 x Per Day/30 Days Discharge Instructions: Apply Kerlix as directed Secured With: Tubigrip Size D, 3x10 (in/yd) 1 x Per Day/30 Days 1. I would recommend that we have the patient continue to monitor for any signs of infection or worsening. In general I think that he is making really good headway towards closure I am very pleased in that regard. 2. I am going to recommend as well that he should continue with the Tubigrip size D which I think is doing well also using the alginate dressing. 3. Muscle can recommend he should continue to elevate his legs much as possible to more this he can do the better. 4. I am going to go ahead and repeat up with the Levaquin for 2 additional weeks I will get that sent into the pharmacy for him as well he is in agreement with that plan. We will see patient back for reevaluation in 1 week here in the clinic. If anything worsens or changes patient will contact our office for additional recommendations. Electronic Signature(s) Signed: 05/01/2023 11:19:01 AM By: [name] PA-C Previous Signature:  05/01/2023 11:16:16 AM Version By: [name] PA-C Entered By: [name] on 05/01/2023 08:19:00 -------------------------------------------------------------------------------- SuperBill Details Patient Name: Date of Service: Chad [name], Chad [name] 05/01/2023 Medical Record Number: [id number] Patient Account Number: [id number] Date of Birth/Sex: Treating RN: 1960/12/17 (62 y.o. 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, stage 3 Facility Procedures : CPT4 Code: 41324401 Description: 02725 - WOUND CARE VISIT-LEV 5 EST PT Modifier: Quantity: 1 Physician Procedures : CPT4 Code Description Modifier [id number] - WC PHYS LEVEL 4 - EST PT ICD-10 Diagnosis Description 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 (idiopathic) with ulcer and inflammation of left lower extremity L98.492 Non-pressure chronic ulcer of skin of  other sites with fat layer exposed 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 [name] Primary Care Provider: [name] Other Clinician: Referring Provider: Treating Provider/Extender: [name] Weeks in Treatment: 85 Information Obtained from: Patient Chief Complaint Left ankle ulcer, Left dorsal foot wound, right heel Electronic Signature(s) Signed: 05/01/2023 11:02:29 AM By: [name] PA-C Entered By: [name] on 05/01/2023 08:02:28 -------------------------------------------------------------------------------- HPI Details Patient Name: Date of Service: Chad [name] 05/01/2023 10:45 A M Medical Record Number: [id number] Patient Account Number: [id number] Date of Birth/Sex: Treating RN: 1961/07/09 (62 y.o. [name] Primary Care Provider: [name] Other Clinician: Referring Provider: Treating Provider/Extender: [name] in Treatment: 63 History of Present Illness HPI Description: 10/08/18 on evaluation today patient actually presents to our office for initial evaluation concerning wounds that he has of the bilateral lower extremities. 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 He also has bilateral venous stasis. He apparently has been homeless currently following discharge from the hospital apparently he has been placed at [facility] which is is a skilled nursing facility locally. Nonetheless fortunately he does not show any signs of infection at this time which is good news. In fact several of the wound  actually appears to be showing some signs of improvement already in my pinion. There are a couple areas in the left leg in particular there likely gonna require some sharp debridement to help clear away some necrotic tissue and help with more sufficient healing. No fevers, chills, nausea, or vomiting noted at this time. 10/15/18 on evaluation today patient actually appears to be doing very well in regard to his bilateral lower extremities. He's been tolerating the dressing changes without complication. Fortunately there does not appear to be any evidence of active infection at this time which is great news. Overall I'm actually very pleased with how this has progressed in just one visits time. Readmission: 08/14/2020 upon evaluation today patient presents for re-evaluation here in our clinic. He is having issues with his left ankle region as well as his right toe and his right heel. He tells me that the toe and heel actually began as a area that was itching that he was scratching and then subsequently opened up into wounds. These may have been abscess areas I presume based on what I am seeing currently. With regard to his left ankle region he tells me this was a similar type JOHATHON, OVERTURF ([id number]) [id number] Page 2 of 17 occurrence although he does have venous stasis this very well may be more of a venous leg ulcer more than anything. Nonetheless I do believe that the patient would benefit from appropriate and aggressive wound care to try to help get things under better control here. He does have history of a stroke on the left side affecting him to some degree there that he is able to stand although he does have some residual weakness. Otherwise again the patient does have chronic venous insufficiency as previously noted. His arterial studies most recently obtained showed that he had an ABI on the right of 1.16 with a TBI of 0.52 and on the left and ABI of 1.14 with  Apply Kerlix as directed Secured With: Tubigrip Size D, 3x10 (in/yd) 1 x Per Day/30 Days Wound #21 - Foot Wound Laterality: Dorsal, Right Cleanser: Soap and Water 1 x Per Day/30 Days Discharge Instructions: Gently cleanse wound with antibacterial soap, rinse and pat dry prior to dressing wounds [name] ([id number]) [id number] Page 8 of 17 Cleanser: Wound Cleanser 1 x Per Day/30 Days Discharge Instructions: Wash your hands with soap and water. Remove old dressing, discard into plastic bag and place into trash. Cleanse the wound with Wound Cleanser prior to applying a clean dressing using gauze sponges, not tissues or cotton balls. Do not scrub or use excessive force. Pat dry using gauze  sponges, not tissue or cotton balls. Prim Dressing: Silvercel Small 2x2 (in/in) 1 x Per Day/30 Days ary Discharge Instructions: Apply Silvercel Small 2x2 (in/in) as instructed Secondary Dressing: Zetuvit Plus 4x8 (in/in) 1 x Per Day/30 Days Secured With: Kerlix Roll Sterile or Non-Sterile 6-ply 4.5x4 (yd/yd) 1 x Per Day/30 Days Discharge Instructions: Apply Kerlix as directed Secured With: Tubigrip Size D, 3x10 (in/yd) 1 x Per Day/30 Days Wound #22 - Lower Leg Wound Laterality: Right, Anterior Cleanser: Soap and Water 1 x Per Day/30 Days Discharge Instructions: Gently cleanse wound with antibacterial soap, rinse and pat dry prior to dressing wounds Cleanser: Wound Cleanser 1 x Per Day/30 Days Discharge Instructions: Wash your hands with soap and water. Remove old dressing, discard into plastic bag and place into trash. Cleanse the wound with Wound Cleanser prior to applying a clean dressing using gauze sponges, not tissues or cotton balls. Do not scrub or use excessive force. Pat dry using gauze sponges, not tissue or cotton balls. Prim Dressing: Silvercel Small 2x2 (in/in) 1 x Per Day/30 Days ary Discharge Instructions: Apply Silvercel Small 2x2 (in/in) as instructed Secondary Dressing: Zetuvit Plus 4x8 (in/in) 1 x Per Day/30 Days Secured With: Kerlix Roll Sterile or Non-Sterile 6-ply 4.5x4 (yd/yd) 1 x Per Day/30 Days Discharge Instructions: Apply Kerlix as directed Secured With: Tubigrip Size D, 3x10 (in/yd) 1 x Per Day/30 Days Patient Medications llergies: penicillin A Notifications Medication Indication Start End 05/01/2023 levofloxacin DOSE 1 - oral 500 mg tablet - 1 tablet oral once daily x 14 days. Do not take calcium or multivitamin with this medication Electronic Signature(s) Signed: 05/01/2023 11:18:49 AM By: [name] PA-C Entered By: [name] on 05/01/2023 08:18:47 Prescription  05/01/2023 -------------------------------------------------------------------------------- Melvia Heaps PA-C Patient Name: Provider: Jul 27, 1961 [id number] Date of Birth: NPI#Geoffery Spruce Sex: DEA #: [phone] Phone #: License #: UPN: Patient Address: [address] [facility] [place], [place] [address] [facility] [address] [place], [place] [address] [phone] Chad [name], Chad [name] ([id number]) [id number] Page 9 of 17 Allergies penicillin Medication Medication: Route: Strength: Form: levofloxacin oral 500 mg tablet Class: QUINOLONE ANTIBIOTICS Dose: Frequency / Time: Indication: 1 1 tablet oral once daily x 14 days. Do not take calcium or multivitamin with this medication Number of Refills: Number of Units: 0 Fourteen (14) Tablet(s) Generic Substitution: Start Date: End Date: Administered at Facility: Substitution Permitted 05/01/2023 No Note to Pharmacy: Hand Signature: Date(s): Electronic Signature(s) Signed: 05/01/2023 11:19:32 AM By: [name] PA-C Entered By: [name] on 05/01/2023 08:18:49 -------------------------------------------------------------------------------- Problem List Details Patient Name: Date of Service: Chad [name] 05/01/2023 10:45 A M Medical Record Number: [id number] Patient Account Number: [id number] Date of Birth/Sex: Treating RN: Feb 06, 1961 (62 y.o. [name] Primary Care Provider: [name] Other Clinician: Referring Provider:

## 2023-05-01 NOTE — Progress Notes (Signed)
Laterality: Dorsal, Right Cleanser Soap and Water Discharge Instruction: Gently cleanse wound with antibacterial soap, rinse and pat dry prior to dressing wounds Wound Cleanser Discharge Instruction: Wash your hands with soap and water. Remove old dressing, discard into plastic bag and place into trash. Cleanse the wound with Wound Cleanser prior to applying a clean dressing using gauze sponges, not tissues or cotton balls. Do not scrub or use excessive force. Pat dry using gauze sponges, not tissue or cotton balls. Peri-Wound Care Topical Primary Dressing Silvercel Small 2x2 (in/in) Discharge Instruction: Apply Silvercel Small 2x2 (in/in) as instructed Secondary Dressing Zetuvit Plus 4x8 (in/in) Secured With [employer] Sterile or Non-Sterile 6-ply 4.5x4 (yd/yd) Discharge Instruction: Apply Kerlix as directed Tubigrip Size D, 3x10  (in/yd) Compression Wrap Compression Stockings Add-Ons Electronic Signature(s) Signed: 05/01/2023 1:36:59 PM By: Midge Aver MSN RN CNS WTA Entered By: Midge Aver on 05/01/2023 10:58:36 [name] ([id number]) [id number] Page 13 of 14 -------------------------------------------------------------------------------- Wound Assessment Details Patient Name: Date of Service: Chad [name], Chad [name] 05/01/2023 10:45 A M Medical Record Number: [id number] Patient Account Number: [id number] Date of Birth/Sex: Treating RN: 1961-02-12 (62 y.o. [name] Primary Care Mykaylah Ballman: [name] Other Clinician: Referring Tamlyn Sides: Treating Ashla Murph/Extender: [name] Weeks in Treatment: 73 Wound Status Wound Number: 22 Primary Venous Leg Ulcer Etiology: Wound Location: Right, Anterior Lower Leg Wound Open Wounding Event: Pressure Injury Status: Date Acquired: 03/02/2023 Comorbid Anemia, Chronic Obstructive Pulmonary Disease (COPD), Coronary Weeks Of Treatment: 5 History: Artery Disease, Peripheral Arterial Disease, Peripheral Venous Clustered Wound: Yes Disease, Hepatitis C, Osteoarthritis, Neuropathy, Received Chemotherapy Photos Wound Measurements Length: (cm) 9 Width: (cm) 18 Depth: (cm) 0.1 Area: (cm) 127.235 Volume: (cm) 12.723 % Reduction in Area: -5300.5% % Reduction in Volume: -2601.3% Epithelialization: Small (1-33%) Wound Description Classification: Full Thickness Without Exposed Support Structures Exudate Amount: Medium Exudate Type: Serous Exudate Color: amber Foul Odor After Cleansing: No Slough/Fibrino Yes Wound Bed Granulation Amount: Medium (34-66%) Exposed Structure Granulation Quality: Red, Pink Fascia Exposed: No Necrotic Amount: Small (1-33%) Fat Layer (Subcutaneous Tissue) Exposed: Yes Necrotic Quality: Adherent Slough Tendon Exposed: No Muscle Exposed: No Joint Exposed: No Bone Exposed: No Treatment  Notes Wound #22 (Lower Leg) Wound Laterality: Right, Anterior Cleanser Soap and Water Discharge Instruction: Gently cleanse wound with antibacterial soap, rinse and pat dry prior to dressing wounds [name] ([id number]) [id number] Page 14 of 14 Wound Cleanser Discharge Instruction: Wash your hands with soap and water. Remove old dressing, discard into plastic bag and place into trash. Cleanse the wound with Wound Cleanser prior to applying a clean dressing using gauze sponges, not tissues or cotton balls. Do not scrub or use excessive force. Pat dry using gauze sponges, not tissue or cotton balls. Peri-Wound Care Topical Primary Dressing Silvercel Small 2x2 (in/in) Discharge Instruction: Apply Silvercel Small 2x2 (in/in) as instructed Secondary Dressing Zetuvit Plus 4x8 (in/in) Secured With [employer] Sterile or Non-Sterile 6-ply 4.5x4 (yd/yd) Discharge Instruction: Apply Kerlix as directed Tubigrip Size D, 3x10 (in/yd) Compression Wrap Compression Stockings Add-Ons Electronic Signature(s) Signed: 05/01/2023 1:36:59 PM By: Midge Aver MSN RN CNS WTA Entered By: Midge Aver on 05/01/2023 10:59:21 -------------------------------------------------------------------------------- Vitals Details Patient Name: Date of Service: Chad [name] 05/01/2023 10:45 A M Medical Record Number: [id number] Patient Account Number: [id number] Date of Birth/Sex: Treating RN: 07/10/1961 (62 y.o. [name] Primary Care Ashleigh Arya: [name] Other Clinician: Referring Jimi Giza: Treating Rook Maue/Extender: [name] Weeks in Treatment: 110 Vital Signs Time Taken: 10:39 Temperature (F): 97.8 Height (in): 69  Tracing (instead of photographs) []  - 0 Simple Wound Measurement - one wound X- 4 5 Complex Wound Measurement - multiple wounds INTERVENTIONS - Wound Dressings []  - 0 Small Wound Dressing one or multiple wounds X- 4 15 Medium Wound Dressing one or multiple wounds []  - 0 Large Wound Dressing one or multiple wounds []  - 0 Application of Medications - topical []  - 0 Application of Medications - injection [name] ([id number]) [id number] Page 3 of 14 INTERVENTIONS - Miscellaneous []  - 0 External ear exam []  - 0 Specimen Collection (cultures, biopsies, blood, body fluids, etc.) []  - 0 Specimen(s) / Culture(s) sent or taken to Lab for analysis []  - 0 Patient Transfer (multiple staff / Michiel Sites Lift / Similar devices) []  - 0 Simple Staple / Suture removal (25 or less) []  - 0 Complex Staple / Suture removal (26 or more) []  - 0 Hypo / Hyperglycemic Management (close monitor of Blood Glucose) []  - 0 Ankle / Brachial Index (ABI) - do not check if billed separately X- 1 5 Vital Signs Has the patient been seen at the hospital within the last three years: Yes Total Score: 190 Level Of Care: New/Established - Level 5 Electronic Signature(s) Signed: 05/01/2023 1:36:59 PM By: Midge Aver MSN RN CNS WTA Entered By: Midge Aver on 05/01/2023 11:31:03 -------------------------------------------------------------------------------- Encounter Discharge Information Details Patient Name: Date of Service: Chad [name] 05/01/2023 10:45 A M Medical Record Number: [id number] Patient Account Number: [id number] Date of Birth/Sex: Treating RN: [birth year] (61 y.o. [name] Primary Care Laron Angelini: [name] Other Clinician: Referring  Tarina Volk: Treating Gerod Caligiuri/Extender: [name] in Treatment: 3 Encounter Discharge Information Items Discharge Condition: Stable Ambulatory Status: Wheelchair Discharge Destination: Skilled Nursing Facility Telephoned: No Orders Sent: Yes Transportation: Other Accompanied By: self Schedule Follow-up Appointment: Yes Clinical Summary of Care: Electronic Signature(s) Signed: 05/01/2023 1:36:59 PM By: Midge Aver MSN RN CNS WTA Entered By: Midge Aver on 05/01/2023 11:32:45 [name] ([id number]) [id number] Page 4 of 14 -------------------------------------------------------------------------------- Lower Extremity Assessment Details Patient Name: Date of Service: Chad [name], Chad [name] 05/01/2023 10:45 A M Medical Record Number: [id number] Patient Account Number: [id number] Date of Birth/Sex: Treating RN: [birth year] (62 y.o. [name] Primary Care Zayde Stroupe: [name] Other Clinician: Referring Holle Sprick: Treating Talor Desrosiers/Extender: [name] Weeks in Treatment: 73 Edema Assessment Assessed: [Left: No] [Right: No] [Left: Edema] [Right: :] Calf Left: Right: Point of Measurement: 33 cm From Medial Instep 41 cm Ankle Left: Right: Point of Measurement: 12 cm From Medial Instep 24 cm Vascular Assessment Pulses: Dorsalis Pedis Palpable: [Right:Yes] Extremity colors, hair growth, and conditions: Extremity Color: [Right:Red] Hair Growth on Extremity: [Right:No] Temperature of Extremity: [Right:Warm] Capillary Refill: [Right:> 3 seconds] Dependent Rubor: [Right:No] Blanched when Elevated: [Right:No No] Toe Nail Assessment Left: Right: Thick: Yes Discolored: Yes Deformed: Yes Improper Length and Hygiene: Yes Electronic Signature(s) Signed: 05/01/2023 1:36:59 PM By: Midge Aver MSN RN CNS WTA Entered By: Midge Aver on 05/01/2023 11:12:00 [name] ([id number])  [id number] Page 5 of 14 -------------------------------------------------------------------------------- Multi Wound Chart Details Patient Name: Date of Service: Chad [name], Chad [name] 05/01/2023 10:45 A M Medical Record Number: [id number] Patient Account Number: [id number] Date of Birth/Sex: Treating RN: 06-Jan-1961 (62 y.o. [name] Primary Care Aziza Stuckert: [name] Other Clinician: Referring Grae Cannata: Treating Delpha Perko/Extender: [name] Weeks in Treatment: 52 Vital Signs Height(in): 69 Pulse(bpm): 91 Weight(lbs): 150 Blood Pressure(mmHg): 116/71 Body Mass Index(BMI): 22.1 Temperature(F): 97.8 Respiratory Rate(breaths/min): 18 [16:Photos:] Right Calcaneus Right, Medial T Second oe Right,  Dorsal Foot Wound Location: Gradually Appeared Pressure Injury Pressure Injury Wounding Event: Pressure Ulcer Pressure Ulcer Venous Leg Ulcer Primary Etiology: Anemia, Chronic Obstructive Anemia, Chronic Obstructive Anemia, Chronic Obstructive Comorbid History: Pulmonary Disease (COPD), Coronary Pulmonary Disease (COPD), Coronary Pulmonary Disease (COPD), Coronary Artery Disease, Peripheral Arterial Artery Disease, Peripheral Arterial Artery Disease, Peripheral Arterial Disease, Peripheral Venous Disease, Disease, Peripheral Venous Disease, Disease, Peripheral Venous Disease, Hepatitis C, Osteoarthritis, Hepatitis C, Osteoarthritis, Hepatitis C, Osteoarthritis, Neuropathy, Received Chemotherapy Neuropathy, Received Chemotherapy Neuropathy, Received Chemotherapy 09/26/2022 09/26/2022 03/02/2023 Date Acquired: 29 28 5  Weeks of Treatment: Open Open Open Wound Status: No No No Wound Recurrence: Yes No Yes Clustered Wound: 9x4.5x0.3 2.5x1.3x0.1 4x4.5x0.2 Measurements L x W x D (cm) 31.809 2.553 14.137 A (cm) : rea 9.543 0.255 2.827 Volume (cm) : -3276.80% -364.20% -700.10% % Reduction in Area: -4976.10% -363.60% -1497.20% % Reduction in  Volume: Category/Stage III Category/Stage III Full Thickness Without Exposed Classification: Support Structures Medium Medium Medium Exudate A mount: Serosanguineous Serosanguineous Serous Exudate Type: red, brown red, brown amber Exudate Color: Small (1-33%) Small (1-33%) Small (1-33%) Granulation Amount: Red, Pink Red, Pink Red, Pink Granulation Quality: Large (67-100%) Medium (34-66%) Small (1-33%) Necrotic Amount: Eschar, Adherent Slough Adherent [employer] Necrotic Tissue: Fat Layer (Subcutaneous Tissue): Yes Fat Layer (Subcutaneous Tissue): Yes Fat Layer (Subcutaneous Tissue): Yes Exposed Structures: Fascia: No Fascia: No Fascia: No Tendon: No Tendon: No Tendon: No Muscle: No Muscle: No Muscle: No Joint: No Joint: No Joint: No Bone: No Bone: No Bone: No None None Medium (34-66%) Epithelialization: Wound Number: 22 N/A N/A Photos: N/A Bebe Shaggy ([id number]) [id number] Page 6 of 14 Right, Anterior Lower Leg N/A N/A Wound Location: Pressure Injury N/A N/A Wounding Event: Venous Leg Ulcer N/A N/A Primary Etiology: Anemia, Chronic Obstructive N/A N/A Comorbid History: Pulmonary Disease (COPD), Coronary Artery Disease, Peripheral Arterial Disease, Peripheral Venous Disease, Hepatitis C, Osteoarthritis, Neuropathy, Received Chemotherapy 03/02/2023 N/A N/A Date Acquired: 5 N/A N/A Weeks of Treatment: Open N/A N/A Wound Status: No N/A N/A Wound Recurrence: Yes N/A N/A Clustered Wound: 9x18x0.1 N/A N/A Measurements L x W x D (cm) 127.235 N/A N/A A (cm) : rea 12.723 N/A N/A Volume (cm) : -5300.50% N/A N/A % Reduction in Area: -2601.30% N/A N/A % Reduction in Volume: Full Thickness Without Exposed N/A N/A Classification: Support Structures Medium N/A N/A Exudate A mount: Serous N/A N/A Exudate Type: amber N/A N/A Exudate Color: Medium (34-66%) N/A N/A Granulation Amount: Red, Pink N/A  N/A Granulation Quality: Small (1-33%) N/A N/A Necrotic Amount: Adherent Slough N/A N/A Necrotic Tissue: Fat Layer (Subcutaneous Tissue): Yes N/A N/A Exposed Structures: Fascia: No Tendon: No Muscle: No Joint: No Bone: No Small (1-33%) N/A N/A Epithelialization: Treatment Notes Electronic Signature(s) Signed: 05/01/2023 1:36:59 PM By: Midge Aver MSN RN CNS WTA Entered By: Midge Aver on 05/01/2023 11:13:07 -------------------------------------------------------------------------------- Multi-Disciplinary Care Plan Details Patient Name: Date of Service: Chad [name] 05/01/2023 10:45 A M Medical Record Number: [id number] Patient Account Number: [id number] Date of Birth/Sex: Treating RN: [birth year] (62 y.o. [name] Primary Care Lofton Leon: [name] Other Clinician: Referring Chayton Murata: Treating Tracer Gutridge/Extender: [name] Weeks in Treatment: [address], [place] ([id number]) [id number] Page 7 of 14 Active Inactive Venous Leg Ulcer Nursing Diagnoses: Knowledge deficit related to disease process and management Goals: Patient will maintain optimal edema control Date Initiated: 12/06/2021 Target Resolution Date: 05/26/2023 Goal Status: Active Patient/caregiver will verbalize understanding of disease process and disease management Date Initiated: 12/06/2021 Date Inactivated: 12/19/2022 Target Resolution Date: 11/27/2022  Goal Status: Met Interventions: Assess peripheral edema status every visit. Compression as ordered Notes: Electronic Signature(s) Signed: 05/01/2023 1:36:59 PM By: Midge Aver MSN RN CNS WTA Entered By: Midge Aver on 05/01/2023 11:31:27 -------------------------------------------------------------------------------- Pain Assessment Details Patient Name: Date of Service: Chad [name], Chad [name] 05/01/2023 10:45 A M Medical Record Number: [id number] Patient Account Number: [id number] Date of Birth/Sex:  Treating RN: [birth year] (62 y.o. [name] Primary Care Milik Gilreath: [name] Other Clinician: Referring Rehaan Viloria: Treating Taiten Brawn/Extender: [name] Weeks in Treatment: 17 Active Problems Location of Pain Severity and Description of Pain Patient Has Paino No Site Locations Pain Management and Medication Current Pain Management: LETROY, VAZGUEZ ([id number]) [id number] Page 8 of 14 Electronic Signature(s) Signed: 05/01/2023 1:36:59 PM By: Midge Aver MSN RN CNS WTA Entered By: Midge Aver on 05/01/2023 10:42:21 -------------------------------------------------------------------------------- Patient/Caregiver Education Details Patient Name: Date of Service: Chad [name] 10/4/2024andnbsp10:45 A M Medical Record Number: [id number] Patient Account Number: [id number] Date of Birth/Gender: Treating RN: 1961-05-10 (62 y.o. [name] Primary Care Physician: [name] Other Clinician: Referring Physician: Treating Physician/Extender: [name] in Treatment: 32 Education Assessment Education Provided To: Patient Education Topics Provided Wound/Skin Impairment: Handouts: Caring for Your Ulcer Methods: Explain/Verbal Responses: State content correctly Electronic Signature(s) Signed: 05/01/2023 1:36:59 PM By: Midge Aver MSN RN CNS WTA Entered By: Midge Aver on 05/01/2023 11:31:39 -------------------------------------------------------------------------------- Wound Assessment Details Patient Name: Date of Service: Chad [name] 05/01/2023 10:45 A M Medical Record Number: [id number] Patient Account Number: [id number] Date of Birth/Sex: Treating RN: 16-Feb-1961 (62 y.o. [name] Primary Care Zakari Couchman: [name] Other Clinician: Referring Kambrea Carrasco: Treating Evin Chirco/Extender: [name] Weeks in Treatment: 73 Wound Status Wound Number: 16 Primary Pressure  Ulcer Etiology: Wound Location: Right Calcaneus Wound Open Wounding Event: Gradually Appeared Status: Date Acquired: 09/26/2022 Comorbid Anemia, Chronic Obstructive Pulmonary Disease (COPD), Coronary Weeks Of Treatment: 29 History: Artery Disease, Peripheral Arterial Disease, Peripheral Venous Clustered Wound: Yes Disease, Hepatitis C, Osteoarthritis, Neuropathy, Received Chad [name], Chad [name] ([id number]) [phone].pdf Page 9 of 14 Chemotherapy Photos Wound Measurements Length: (cm) 9 Width: (cm) 4.5 Depth: (cm) 0.3 Area: (cm) 31.809 Volume: (cm) 9.543 % Reduction in Area: -3276.8% % Reduction in Volume: -4976.1% Epithelialization: None Wound Description Classification: Category/Stage III Exudate Amount: Medium Exudate Type: Serosanguineous Exudate Color: red, brown Slough/Fibrino Yes Wound Bed Granulation Amount: Small (1-33%) Exposed Structure Granulation Quality: Red, Pink Fascia Exposed: No Necrotic Amount: Large (67-100%) Fat Layer (Subcutaneous Tissue) Exposed: Yes Necrotic Quality: Eschar, Adherent Slough Tendon Exposed: No Muscle Exposed: No Joint Exposed: No Bone Exposed: No Treatment Notes Wound #16 (Calcaneus) Wound Laterality: Right Cleanser Soap and Water Discharge Instruction: Gently cleanse wound with antibacterial soap, rinse and pat dry prior to dressing wounds Wound Cleanser Discharge Instruction: Wash your hands with soap and water. Remove old dressing, discard into plastic bag and place into trash. Cleanse the wound with Wound Cleanser prior to applying a clean dressing using gauze sponges, not tissues or cotton balls. Do not scrub or use excessive force. Pat dry using gauze sponges, not tissue or cotton balls. Peri-Wound Care Topical Primary Dressing Silvercel Small 2x2 (in/in) Discharge Instruction: Apply Silvercel Small 2x2 (in/in) as instructed Secondary Dressing Zetuvit Plus 4x8 (in/in) Heel Cup Secured With [employer] Sterile or Non-Sterile 6-ply 4.5x4 (yd/yd) Discharge Instruction: Apply Kerlix as directed Tubigrip Size D, 3x10 (in/yd) Compression Wrap Compression Stockings Add-Ons Electronic Signature(s) Signed: 05/01/2023 1:36:59 PM By: Midge Aver MSN RN CNS Chad [name], Chad Maduro ([id number]) [id number] Page 10 of  Tracing (instead of photographs) []  - 0 Simple Wound Measurement - one wound X- 4 5 Complex Wound Measurement - multiple wounds INTERVENTIONS - Wound Dressings []  - 0 Small Wound Dressing one or multiple wounds X- 4 15 Medium Wound Dressing one or multiple wounds []  - 0 Large Wound Dressing one or multiple wounds []  - 0 Application of Medications - topical []  - 0 Application of Medications - injection [name] ([id number]) [id number] Page 3 of 14 INTERVENTIONS - Miscellaneous []  - 0 External ear exam []  - 0 Specimen Collection (cultures, biopsies, blood, body fluids, etc.) []  - 0 Specimen(s) / Culture(s) sent or taken to Lab for analysis []  - 0 Patient Transfer (multiple staff / Michiel Sites Lift / Similar devices) []  - 0 Simple Staple / Suture removal (25 or less) []  - 0 Complex Staple / Suture removal (26 or more) []  - 0 Hypo / Hyperglycemic Management (close monitor of Blood Glucose) []  - 0 Ankle / Brachial Index (ABI) - do not check if billed separately X- 1 5 Vital Signs Has the patient been seen at the hospital within the last three years: Yes Total Score: 190 Level Of Care: New/Established - Level 5 Electronic Signature(s) Signed: 05/01/2023 1:36:59 PM By: Midge Aver MSN RN CNS WTA Entered By: Midge Aver on 05/01/2023 11:31:03 -------------------------------------------------------------------------------- Encounter Discharge Information Details Patient Name: Date of Service: Chad [name] 05/01/2023 10:45 A M Medical Record Number: [id number] Patient Account Number: [id number] Date of Birth/Sex: Treating RN: [birth year] (61 y.o. [name] Primary Care Laron Angelini: [name] Other Clinician: Referring  Tarina Volk: Treating Gerod Caligiuri/Extender: [name] in Treatment: 3 Encounter Discharge Information Items Discharge Condition: Stable Ambulatory Status: Wheelchair Discharge Destination: Skilled Nursing Facility Telephoned: No Orders Sent: Yes Transportation: Other Accompanied By: self Schedule Follow-up Appointment: Yes Clinical Summary of Care: Electronic Signature(s) Signed: 05/01/2023 1:36:59 PM By: Midge Aver MSN RN CNS WTA Entered By: Midge Aver on 05/01/2023 11:32:45 [name] ([id number]) [id number] Page 4 of 14 -------------------------------------------------------------------------------- Lower Extremity Assessment Details Patient Name: Date of Service: Chad [name], Chad [name] 05/01/2023 10:45 A M Medical Record Number: [id number] Patient Account Number: [id number] Date of Birth/Sex: Treating RN: [birth year] (62 y.o. [name] Primary Care Zayde Stroupe: [name] Other Clinician: Referring Holle Sprick: Treating Talor Desrosiers/Extender: [name] Weeks in Treatment: 73 Edema Assessment Assessed: [Left: No] [Right: No] [Left: Edema] [Right: :] Calf Left: Right: Point of Measurement: 33 cm From Medial Instep 41 cm Ankle Left: Right: Point of Measurement: 12 cm From Medial Instep 24 cm Vascular Assessment Pulses: Dorsalis Pedis Palpable: [Right:Yes] Extremity colors, hair growth, and conditions: Extremity Color: [Right:Red] Hair Growth on Extremity: [Right:No] Temperature of Extremity: [Right:Warm] Capillary Refill: [Right:> 3 seconds] Dependent Rubor: [Right:No] Blanched when Elevated: [Right:No No] Toe Nail Assessment Left: Right: Thick: Yes Discolored: Yes Deformed: Yes Improper Length and Hygiene: Yes Electronic Signature(s) Signed: 05/01/2023 1:36:59 PM By: Midge Aver MSN RN CNS WTA Entered By: Midge Aver on 05/01/2023 11:12:00 [name] ([id number])  [id number] Page 5 of 14 -------------------------------------------------------------------------------- Multi Wound Chart Details Patient Name: Date of Service: Chad [name], Chad [name] 05/01/2023 10:45 A M Medical Record Number: [id number] Patient Account Number: [id number] Date of Birth/Sex: Treating RN: 06-Jan-1961 (62 y.o. [name] Primary Care Aziza Stuckert: [name] Other Clinician: Referring Grae Cannata: Treating Delpha Perko/Extender: [name] Weeks in Treatment: 52 Vital Signs Height(in): 69 Pulse(bpm): 91 Weight(lbs): 150 Blood Pressure(mmHg): 116/71 Body Mass Index(BMI): 22.1 Temperature(F): 97.8 Respiratory Rate(breaths/min): 18 [16:Photos:] Right Calcaneus Right, Medial T Second oe Right,  Tracing (instead of photographs) []  - 0 Simple Wound Measurement - one wound X- 4 5 Complex Wound Measurement - multiple wounds INTERVENTIONS - Wound Dressings []  - 0 Small Wound Dressing one or multiple wounds X- 4 15 Medium Wound Dressing one or multiple wounds []  - 0 Large Wound Dressing one or multiple wounds []  - 0 Application of Medications - topical []  - 0 Application of Medications - injection [name] ([id number]) [id number] Page 3 of 14 INTERVENTIONS - Miscellaneous []  - 0 External ear exam []  - 0 Specimen Collection (cultures, biopsies, blood, body fluids, etc.) []  - 0 Specimen(s) / Culture(s) sent or taken to Lab for analysis []  - 0 Patient Transfer (multiple staff / Michiel Sites Lift / Similar devices) []  - 0 Simple Staple / Suture removal (25 or less) []  - 0 Complex Staple / Suture removal (26 or more) []  - 0 Hypo / Hyperglycemic Management (close monitor of Blood Glucose) []  - 0 Ankle / Brachial Index (ABI) - do not check if billed separately X- 1 5 Vital Signs Has the patient been seen at the hospital within the last three years: Yes Total Score: 190 Level Of Care: New/Established - Level 5 Electronic Signature(s) Signed: 05/01/2023 1:36:59 PM By: Midge Aver MSN RN CNS WTA Entered By: Midge Aver on 05/01/2023 11:31:03 -------------------------------------------------------------------------------- Encounter Discharge Information Details Patient Name: Date of Service: Chad [name] 05/01/2023 10:45 A M Medical Record Number: [id number] Patient Account Number: [id number] Date of Birth/Sex: Treating RN: [birth year] (61 y.o. [name] Primary Care Laron Angelini: [name] Other Clinician: Referring  Tarina Volk: Treating Gerod Caligiuri/Extender: [name] in Treatment: 3 Encounter Discharge Information Items Discharge Condition: Stable Ambulatory Status: Wheelchair Discharge Destination: Skilled Nursing Facility Telephoned: No Orders Sent: Yes Transportation: Other Accompanied By: self Schedule Follow-up Appointment: Yes Clinical Summary of Care: Electronic Signature(s) Signed: 05/01/2023 1:36:59 PM By: Midge Aver MSN RN CNS WTA Entered By: Midge Aver on 05/01/2023 11:32:45 [name] ([id number]) [id number] Page 4 of 14 -------------------------------------------------------------------------------- Lower Extremity Assessment Details Patient Name: Date of Service: Chad [name], Chad [name] 05/01/2023 10:45 A M Medical Record Number: [id number] Patient Account Number: [id number] Date of Birth/Sex: Treating RN: [birth year] (62 y.o. [name] Primary Care Zayde Stroupe: [name] Other Clinician: Referring Holle Sprick: Treating Talor Desrosiers/Extender: [name] Weeks in Treatment: 73 Edema Assessment Assessed: [Left: No] [Right: No] [Left: Edema] [Right: :] Calf Left: Right: Point of Measurement: 33 cm From Medial Instep 41 cm Ankle Left: Right: Point of Measurement: 12 cm From Medial Instep 24 cm Vascular Assessment Pulses: Dorsalis Pedis Palpable: [Right:Yes] Extremity colors, hair growth, and conditions: Extremity Color: [Right:Red] Hair Growth on Extremity: [Right:No] Temperature of Extremity: [Right:Warm] Capillary Refill: [Right:> 3 seconds] Dependent Rubor: [Right:No] Blanched when Elevated: [Right:No No] Toe Nail Assessment Left: Right: Thick: Yes Discolored: Yes Deformed: Yes Improper Length and Hygiene: Yes Electronic Signature(s) Signed: 05/01/2023 1:36:59 PM By: Midge Aver MSN RN CNS WTA Entered By: Midge Aver on 05/01/2023 11:12:00 [name] ([id number])  [id number] Page 5 of 14 -------------------------------------------------------------------------------- Multi Wound Chart Details Patient Name: Date of Service: Chad [name], Chad [name] 05/01/2023 10:45 A M Medical Record Number: [id number] Patient Account Number: [id number] Date of Birth/Sex: Treating RN: 06-Jan-1961 (62 y.o. [name] Primary Care Aziza Stuckert: [name] Other Clinician: Referring Grae Cannata: Treating Delpha Perko/Extender: [name] Weeks in Treatment: 52 Vital Signs Height(in): 69 Pulse(bpm): 91 Weight(lbs): 150 Blood Pressure(mmHg): 116/71 Body Mass Index(BMI): 22.1 Temperature(F): 97.8 Respiratory Rate(breaths/min): 18 [16:Photos:] Right Calcaneus Right, Medial T Second oe Right,  Chad [name], Chad [name] ([id number]) [id number] Page 1 of 14 Visit Report for 05/01/2023 Arrival Information Details Patient Name: Date of Service: Chad [name], Chad [name] 05/01/2023 10:45 A M Medical Record Number: [id number] Patient Account Number: [id number] Date of Birth/Sex: Treating RN: June 10, 1961 (62 y.o. [name] Primary Care Keiandra Sullenger: [name] Other Clinician: Referring Larna Capelle: Treating Xandria Gallaga/Extender: [name] in Treatment: 29 Visit Information History Since Last Visit Added or deleted any medications: No Patient Arrived: Wheel Chair Any new allergies or adverse reactions: No Arrival Time: 10:32 Has Dressing in Place as Prescribed: Yes Accompanied By: self Pain Present Now: No Transfer Assistance: None Patient Identification Verified: Yes Secondary Verification Process Completed: Yes Patient Requires Transmission-Based No Precautions: Patient Has Alerts: Yes Patient Alerts: Patient on Blood Thinner NOT diabetic aspirin 81mg  Lives [place] [facility] ABI R 0.81 11/05/22 ABI L 0.59 11/05/22 [facility] 03/03/23 Electronic Signature(s) Signed: 05/01/2023 1:36:59 PM By: Midge Aver MSN RN CNS WTA Entered By: Midge Aver on 05/01/2023 10:39:39 -------------------------------------------------------------------------------- Clinic Level of Care Assessment Details Patient Name: Date of Service: Chad [name], Chad [name] 05/01/2023 10:45 A M Medical Record Number: [id number] Patient Account Number: [id number] Date of Birth/Sex: Treating RN: [birth year] (62 y.o. [name] Primary Care Kyion Gautier: [name] Other Clinician: Referring Alleen Kehm: Treating Vernell Back/Extender: [name] Weeks in Treatment: 76 Clinic Level of Care Assessment Items TOOL 4 Quantity Score X- 1 0 Use when only an EandM is performed on FOLLOW-UP visit ASSESSMENTS - Nursing Assessment / Reassessment Chad [name], Chad [name] ([id number])  [id number] Page 2 of 14 X- 1 10 Reassessment of Co-morbidities (includes updates in patient status) X- 1 5 Reassessment of Adherence to Treatment Plan ASSESSMENTS - Wound and Skin A ssessment / Reassessment []  - 0 Simple Wound Assessment / Reassessment - one wound X- 4 5 Complex Wound Assessment / Reassessment - multiple wounds []  - 0 Dermatologic / Skin Assessment (not related to wound area) ASSESSMENTS - Focused Assessment []  - 0 Circumferential Edema Measurements - multi extremities []  - 0 Nutritional Assessment / Counseling / Intervention []  - 0 Lower Extremity Assessment (monofilament, tuning fork, pulses) []  - 0 Peripheral Arterial Disease Assessment (using hand held doppler) ASSESSMENTS - Ostomy and/or Continence Assessment and Care []  - 0 Incontinence Assessment and Management []  - 0 Ostomy Care Assessment and Management (repouching, etc.) PROCESS - Coordination of Care []  - 0 Simple Patient / Family Education for ongoing care X- 1 20 Complex (extensive) Patient / Family Education for ongoing care X- 1 10 Staff obtains Chiropractor, Records, T Results / Process Orders est []  - 0 Staff telephones HHA, Nursing Homes / Clarify orders / etc []  - 0 Routine Transfer to another Facility (non-emergent condition) []  - 0 Routine Hospital Admission (non-emergent condition) []  - 0 New Admissions / Manufacturing engineer / Ordering NPWT Apligraf, etc. , []  - 0 Emergency Hospital Admission (emergent condition) []  - 0 Simple Discharge Coordination X- 1 15 Complex (extensive) Discharge Coordination PROCESS - Special Needs []  - 0 Pediatric / Minor Patient Management []  - 0 Isolation Patient Management []  - 0 Hearing / Language / Visual special needs []  - 0 Assessment of Community assistance (transportation, D/C planning, etc.) []  - 0 Additional assistance / Altered mentation []  - 0 Support Surface(s) Assessment (bed, cushion, seat,  etc.) INTERVENTIONS - Wound Cleansing / Measurement []  - 0 Simple Wound Cleansing - one wound X- 4 5 Complex Wound Cleansing - multiple wounds X- 1 5 Wound Imaging (photographs - any number of wounds) []  - 0 Wound

## 2023-05-04 ENCOUNTER — Other Ambulatory Visit: Payer: Self-pay | Admitting: Internal Medicine

## 2023-05-06 ENCOUNTER — Other Ambulatory Visit: Payer: Self-pay | Admitting: Internal Medicine

## 2023-05-07 ENCOUNTER — Ambulatory Visit (INDEPENDENT_AMBULATORY_CARE_PROVIDER_SITE_OTHER): Payer: Medicaid Other | Admitting: Nurse Practitioner

## 2023-05-08 ENCOUNTER — Encounter: Payer: Medicaid Other | Admitting: Physician Assistant

## 2023-05-08 DIAGNOSIS — I87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 hypertension (idiopathic) with ulcer and inflammation of left lower extremity: Secondary | ICD-10-CM | POA: Diagnosis not present

## 2023-05-08 NOTE — Progress Notes (Addendum)
in/inKYDEN, POTASH ([id number]) [id number] Page 12 of 15 Secured With [employer] or Non-Sterile 6-ply 4.5x4 (yd/yd) Discharge Instruction: Apply Kerlix as directed Tubigrip Size D, 3x10 (in/yd) Compression Wrap Compression Stockings Add-Ons Electronic Signature(s) Signed: 05/08/2023 1:39:07 PM By: Midge Aver MSN RN CNS WTA Entered By: Midge Aver on 05/08/2023 08:24:46 -------------------------------------------------------------------------------- Wound Assessment Details Patient Name: Date of Service: Chad [name] 05/08/2023 10:45 A M Medical Record Number: [id number] Patient Account Number: [id number] Date of Birth/Sex: Treating RN: 05/03/1961 (62 y.o. [name] Primary Care Lakeyta Vandenheuvel: [name] Other Clinician: Referring Lillan Mccreadie: Treating Ekin Pilar/Extender: [name] Weeks in Treatment: 74 Wound Status Wound Number: 22 Primary Venous Leg Ulcer Etiology: Wound  Location: Right, Anterior Lower Leg Wound Open Wounding Event: Pressure Injury Status: Date Acquired: 03/02/2023 Comorbid Anemia, Chronic Obstructive Pulmonary Disease (COPD), Coronary Weeks Of Treatment: 6 History: Artery Disease, Peripheral Arterial Disease, Peripheral Venous Clustered Wound: Yes Disease, Hepatitis C, Osteoarthritis, Neuropathy, Received Chemotherapy Photos Wound Measurements Length: (cm) 10 Width: (cm) 10 Depth: (cm) 0.1 Area: (cm) 78.54 Volume: (cm) 7.854 % Reduction in Area: -3233.6% % Reduction in Volume: -1567.5% Epithelialization: Small (1-33%) Wound Description Classification: Full Thickness Without Exposed Support Exudate Amount: Medium Exudate Type: Serous Exudate Color: amber Structures Foul Odor After Cleansing: No Slough/Fibrino Yes Wound Bed Punt, [name] ([id number]) [id number] Page 13 of 15 Granulation Amount: Medium (34-66%) Exposed Structure Granulation Quality: Red, Pink Fascia Exposed: No Necrotic Amount: Small (1-33%) Fat Layer (Subcutaneous Tissue) Exposed: Yes Necrotic Quality: Adherent Slough Tendon Exposed: No Muscle Exposed: No Joint Exposed: No Bone Exposed: No Treatment Notes Wound #22 (Lower Leg) Wound Laterality: Right, Anterior Cleanser Soap and Water Discharge Instruction: Gently cleanse wound with antibacterial soap, rinse and pat dry prior to dressing wounds Wound Cleanser Discharge Instruction: Wash your hands with soap and water. Remove old dressing, discard into plastic bag and place into trash. Cleanse the wound with Wound Cleanser prior to applying a clean dressing using gauze sponges, not tissues or cotton balls. Do not scrub or use excessive force. Pat dry using gauze sponges, not tissue or cotton balls. Peri-Wound Care Topical Primary Dressing Silvercel Small 2x2 (in/in) Discharge Instruction: Apply Silvercel Small 2x2 (in/in) as instructed Secondary Dressing Zetuvit Plus 4x8  (in/in) Secured With [employer] Sterile or Non-Sterile 6-ply 4.5x4 (yd/yd) Discharge Instruction: Apply Kerlix as directed Tubigrip Size D, 3x10 (in/yd) Compression Wrap Compression Stockings Add-Ons Electronic Signature(s) Signed: 05/08/2023 1:39:07 PM By: Midge Aver MSN RN CNS WTA Entered By: Midge Aver on 05/08/2023 08:25:14 -------------------------------------------------------------------------------- Wound Assessment Details Patient Name: Date of Service: Chad [name] 05/08/2023 10:45 A M Medical Record Number: [id number] Patient Account Number: [id number] Date of Birth/Sex: Treating RN: [birth year] (62 y.o. [name] Primary Care Uzoma Vivona: [name] Other Clinician: Referring Rameen [name]: Treating Jie Stickels/Extender: [name] Weeks in Treatment: 74 Wound Status Wound Number: 23 Primary Pressure Ulcer Etiology: Wound Location: Right, Posterior Lower Leg Wound Open Wounding Event: Pressure Injury Status: Date Acquired: 05/02/2023 Comorbid Anemia, Chronic Obstructive Pulmonary Disease (COPD), Coronary Weeks Of Treatment: 0 History: Artery Disease, Peripheral Arterial Disease, Peripheral Venous Clustered Wound: Yes Disease, Hepatitis C, Osteoarthritis, Neuropathy, Received BRIYAN, KLEVEN ([id number]) [id number] Page 14 of 15 Chemotherapy Photos Wound Measurements Length: (cm) Width: (cm) Depth: (cm) Area: (cm) Volume: (cm) 5 % Reduction in Area: 4 % Reduction in Volume: 0.1 15.708 1.571 Wound Description Classification: Exudate Amount: Exudate Type: Exudate Color: Category/Stage III Medium Serosanguineous red, brown Wound Bed Granulation Amount: Medium (34-66%) Exposed Structure Granulation Quality: Red,  Dorsal Foot Wound Location: Gradually Appeared Pressure Injury Pressure Injury Wounding Event: Pressure Ulcer Pressure Ulcer Venous Leg Ulcer Primary Etiology: Anemia, Chronic Obstructive Anemia, Chronic Obstructive Anemia, Chronic Obstructive Comorbid History: Pulmonary Disease (COPD), Coronary Pulmonary Disease (COPD), Coronary Pulmonary Disease (COPD), Coronary Artery Disease, Peripheral Arterial Artery Disease, Peripheral Arterial Artery Disease, Peripheral Arterial Disease, Peripheral Venous Disease, Disease, Peripheral Venous Disease, Disease, Peripheral Venous Disease, Hepatitis C, Osteoarthritis, Hepatitis C, Osteoarthritis, Hepatitis C, Osteoarthritis, Neuropathy, Received Chemotherapy Neuropathy, Received Chemotherapy Neuropathy, Received Chemotherapy 09/26/2022 09/26/2022 03/02/2023 Date Acquired: 30 29 6  Weeks of Treatment: Open Open Open Wound Status: No No No Wound Recurrence: Yes No Yes Clustered Wound: 9.3x3.8x0.2 2.4x2.3x0.1 4.5x4x0.2 Measurements L x W x D (cm) 27.756 4.335 14.137 A (cm) : rea 5.551 0.434 2.827 Volume (cm) : -2846.50% -688.20% -700.10% % Reduction in Area: -2852.70% -689.10% -1497.20% % Reduction in  Volume: Category/Stage III Category/Stage III Full Thickness Without Exposed Classification: Support Structures Medium Medium Medium Exudate A mount: Serosanguineous Serosanguineous Serous Exudate Type: red, brown red, brown amber Exudate Color: Small (1-33%) Small (1-33%) Small (1-33%) Granulation Amount: Red, Pink Red, Pink Red, Pink Granulation Quality: Large (67-100%) Medium (34-66%) Small (1-33%) Necrotic Amount: Eschar, Adherent Slough Adherent [employer] Necrotic Tissue: Fat Layer (Subcutaneous Tissue): Yes Fat Layer (Subcutaneous Tissue): Yes Fat Layer (Subcutaneous Tissue): Yes Exposed Structures: Fascia: No Fascia: No Fascia: No Tendon: No Tendon: No Tendon: No Muscle: No Muscle: No Muscle: No Joint: No Joint: No Joint: No Bone: No Bone: No Bone: No None None Medium (34-66%) Epithelialization: Wound Number: 22 23 N/A Photos: N/A Right, Anterior Lower Leg Right, Posterior Lower Leg N/A Wound Location: Pressure Injury Pressure Injury N/A Wounding Event: Venous Leg Ulcer Pressure Ulcer N/A Primary Etiology: Anemia, Chronic Obstructive Anemia, Chronic Obstructive N/A Comorbid History: Pulmonary Disease (COPD), Coronary Pulmonary Disease (COPD), Coronary Artery Disease, Peripheral Arterial Artery Disease, Peripheral Arterial Disease, Peripheral Venous Disease, Disease, Peripheral Venous Disease, [place], [name] ([id number]) [id number] Page 6 of 15 Hepatitis C, Osteoarthritis, Hepatitis C, Osteoarthritis, Neuropathy, Received Chemotherapy Neuropathy, Received Chemotherapy 03/02/2023 05/02/2023 N/A Date Acquired: 6 0 N/A Weeks of Treatment: Open Open N/A Wound Status: No No N/A Wound Recurrence: Yes Yes N/A Clustered Wound: 10x10x0.1 5x4x0.1 N/A Measurements L x W x D (cm) 78.54 15.708 N/A A (cm) : rea 7.854 1.571 N/A Volume (cm) : -3233.60% N/A N/A % Reduction in Area: -1567.50% N/A N/A % Reduction in  Volume: Full Thickness Without Exposed Category/Stage III N/A Classification: Support Structures Medium Medium N/A Exudate A mount: Serous Serosanguineous N/A Exudate Type: amber red, brown N/A Exudate Color: Medium (34-66%) Medium (34-66%) N/A Granulation Amount: Red, Pink Red, Pink N/A Granulation Quality: Small (1-33%) Small (1-33%) N/A Necrotic Amount: Adherent [facility] N/A Necrotic Tissue: Fat Layer (Subcutaneous Tissue): Yes Fat Layer (Subcutaneous Tissue): Yes N/A Exposed Structures: Fascia: No Fascia: No Tendon: No Tendon: No Muscle: No Muscle: No Joint: No Joint: No Bone: No Bone: No Small (1-33%) N/A N/A Epithelialization: Treatment Notes Electronic Signature(s) Signed: 05/08/2023 1:39:07 PM By: Midge Aver MSN RN CNS WTA Entered By: Midge Aver on 05/08/2023 08:40:33 -------------------------------------------------------------------------------- Pain Assessment Details Patient Name: Date of Service: Chad [name] 05/08/2023 10:45 A M Medical Record Number: [id number] Patient Account Number: [id number] Date of Birth/Sex: Treating RN: 09-04-1960 (62 y.o. [name] Primary Care Bhavin Monjaraz: [name] Other Clinician: Referring Lyonel Morejon: Treating Zanyah Lentsch/Extender: [name] Weeks in Treatment: 88 Active Problems Location of Pain Severity and Description of Pain Patient Has Paino No Site Locations [facility],  [name] ([id number]) [id number] Page 7 of 15 Pain Management and Medication Current Pain Management: Electronic Signature(s) Signed: 05/08/2023 1:34:56 PM By: Midge Aver MSN RN CNS WTA Entered By: Midge Aver on 05/08/2023 10:34:56 -------------------------------------------------------------------------------- Patient/Caregiver Education Details Patient Name: Date of Service: Chad [name] 10/11/2024andnbsp10:45 A M Medical Record Number: [id number] Patient Account Number:  [id number] Date of Birth/Gender: Treating RN: 01-06-1961 (62 y.o. [name] Primary Care Physician: [name] Other Clinician: Referring Physician: Treating Physician/Extender: [name] in Treatment: 60 Education Assessment Education Provided To: Patient Education Topics Provided Wound/Skin Impairment: Handouts: Caring for Your Ulcer Methods: Explain/Verbal Responses: State content correctly Electronic Signature(s) Signed: 05/08/2023 1:39:07 PM By: Midge Aver MSN RN CNS WTA Entered By: Midge Aver on 05/08/2023 10:37:20 [name] ([id number]) [id number] Page 8 of 15 -------------------------------------------------------------------------------- Wound Assessment Details Patient Name: Date of Service: Chad [name], Chad [name] 05/08/2023 10:45 A M Medical Record Number: [id number] Patient Account Number: [id number] Date of Birth/Sex: Treating RN: 25-Mar-1961 (62 y.o. [name] Primary Care Andrianna Manalang: [name] Other Clinician: Referring Shanteria Laye: Treating Gagandeep Kossman/Extender: [name] Weeks in Treatment: 74 Wound Status Wound Number: 16 Primary Pressure Ulcer Etiology: Wound Location: Right Calcaneus Wound Open Wounding Event: Gradually Appeared Status: Date Acquired: 09/26/2022 Comorbid Anemia, Chronic Obstructive Pulmonary Disease (COPD), Coronary Weeks Of Treatment: 30 History: Artery Disease, Peripheral Arterial Disease, Peripheral Venous Clustered Wound: Yes Disease, Hepatitis C, Osteoarthritis, Neuropathy, Received Chemotherapy Photos Wound Measurements Length: (cm) 9.3 Width: (cm) 3.8 Depth: (cm) 0.2 Area: (cm) 27.756 Volume: (cm) 5.551 % Reduction in Area: -2846.5% % Reduction in Volume: -2852.7% Epithelialization: None Wound Description Classification: Category/Stage III Exudate Amount: Medium Exudate Type: Serosanguineous Exudate Color: red, brown Slough/Fibrino Yes Wound  Bed Granulation Amount: Small (1-33%) Exposed Structure Granulation Quality: Red, Pink Fascia Exposed: No Necrotic Amount: Large (67-100%) Fat Layer (Subcutaneous Tissue) Exposed: Yes Necrotic Quality: Eschar, Adherent Slough Tendon Exposed: No Muscle Exposed: No Joint Exposed: No Bone Exposed: No Treatment Notes Wound #16 (Calcaneus) Wound Laterality: Right Cleanser Soap and Water Discharge Instruction: Gently cleanse wound with antibacterial soap, rinse and pat dry prior to dressing wounds [name] ([id number]) [id number] Page 9 of 15 Wound Cleanser Discharge Instruction: Wash your hands with soap and water. Remove old dressing, discard into plastic bag and place into trash. Cleanse the wound with Wound Cleanser prior to applying a clean dressing using gauze sponges, not tissues or cotton balls. Do not scrub or use excessive force. Pat dry using gauze sponges, not tissue or cotton balls. Peri-Wound Care Topical Primary Dressing Silvercel Small 2x2 (in/in) Discharge Instruction: Apply Silvercel Small 2x2 (in/in) as instructed Secondary Dressing Zetuvit Plus 4x8 (in/in) Heel Cup Secured With [employer] Sterile or Non-Sterile 6-ply 4.5x4 (yd/yd) Discharge Instruction: Apply Kerlix as directed Tubigrip Size D, 3x10 (in/yd) Compression Wrap Compression Stockings Add-Ons Electronic Signature(s) Signed: 05/08/2023 1:39:07 PM By: Midge Aver MSN RN CNS WTA Entered By: Midge Aver on 05/08/2023 08:23:29 -------------------------------------------------------------------------------- Wound Assessment Details Patient Name: Date of Service: Chad [name] 05/08/2023 10:45 A M Medical Record Number: [id number] Patient Account Number: [id number] Date of Birth/Sex: Treating RN: [birth year] (62 y.o. [name] Primary Care Raygen Dahm: [name] Other Clinician: Referring Zarius Furr: Treating Mariza Bourget/Extender: [name] Weeks in Treatment: 74 Wound Status Wound Number: 18 Primary Pressure Ulcer Etiology: Wound Location: Right, Medial T Second oe Wound Open Wounding Event: Pressure Injury Status: Date Acquired: 09/26/2022 Comorbid Anemia, Chronic Obstructive Pulmonary Disease (COPD), Coronary Weeks Of Treatment: 29 History: Artery Disease, Peripheral Arterial  [name] ([id number]) [id number] Page 7 of 15 Pain Management and Medication Current Pain Management: Electronic Signature(s) Signed: 05/08/2023 1:34:56 PM By: Midge Aver MSN RN CNS WTA Entered By: Midge Aver on 05/08/2023 10:34:56 -------------------------------------------------------------------------------- Patient/Caregiver Education Details Patient Name: Date of Service: Chad [name] 10/11/2024andnbsp10:45 A M Medical Record Number: [id number] Patient Account Number:  [id number] Date of Birth/Gender: Treating RN: 01-06-1961 (62 y.o. [name] Primary Care Physician: [name] Other Clinician: Referring Physician: Treating Physician/Extender: [name] in Treatment: 60 Education Assessment Education Provided To: Patient Education Topics Provided Wound/Skin Impairment: Handouts: Caring for Your Ulcer Methods: Explain/Verbal Responses: State content correctly Electronic Signature(s) Signed: 05/08/2023 1:39:07 PM By: Midge Aver MSN RN CNS WTA Entered By: Midge Aver on 05/08/2023 10:37:20 [name] ([id number]) [id number] Page 8 of 15 -------------------------------------------------------------------------------- Wound Assessment Details Patient Name: Date of Service: Chad [name], Chad [name] 05/08/2023 10:45 A M Medical Record Number: [id number] Patient Account Number: [id number] Date of Birth/Sex: Treating RN: 25-Mar-1961 (62 y.o. [name] Primary Care Andrianna Manalang: [name] Other Clinician: Referring Shanteria Laye: Treating Gagandeep Kossman/Extender: [name] Weeks in Treatment: 74 Wound Status Wound Number: 16 Primary Pressure Ulcer Etiology: Wound Location: Right Calcaneus Wound Open Wounding Event: Gradually Appeared Status: Date Acquired: 09/26/2022 Comorbid Anemia, Chronic Obstructive Pulmonary Disease (COPD), Coronary Weeks Of Treatment: 30 History: Artery Disease, Peripheral Arterial Disease, Peripheral Venous Clustered Wound: Yes Disease, Hepatitis C, Osteoarthritis, Neuropathy, Received Chemotherapy Photos Wound Measurements Length: (cm) 9.3 Width: (cm) 3.8 Depth: (cm) 0.2 Area: (cm) 27.756 Volume: (cm) 5.551 % Reduction in Area: -2846.5% % Reduction in Volume: -2852.7% Epithelialization: None Wound Description Classification: Category/Stage III Exudate Amount: Medium Exudate Type: Serosanguineous Exudate Color: red, brown Slough/Fibrino Yes Wound  Bed Granulation Amount: Small (1-33%) Exposed Structure Granulation Quality: Red, Pink Fascia Exposed: No Necrotic Amount: Large (67-100%) Fat Layer (Subcutaneous Tissue) Exposed: Yes Necrotic Quality: Eschar, Adherent Slough Tendon Exposed: No Muscle Exposed: No Joint Exposed: No Bone Exposed: No Treatment Notes Wound #16 (Calcaneus) Wound Laterality: Right Cleanser Soap and Water Discharge Instruction: Gently cleanse wound with antibacterial soap, rinse and pat dry prior to dressing wounds [name] ([id number]) [id number] Page 9 of 15 Wound Cleanser Discharge Instruction: Wash your hands with soap and water. Remove old dressing, discard into plastic bag and place into trash. Cleanse the wound with Wound Cleanser prior to applying a clean dressing using gauze sponges, not tissues or cotton balls. Do not scrub or use excessive force. Pat dry using gauze sponges, not tissue or cotton balls. Peri-Wound Care Topical Primary Dressing Silvercel Small 2x2 (in/in) Discharge Instruction: Apply Silvercel Small 2x2 (in/in) as instructed Secondary Dressing Zetuvit Plus 4x8 (in/in) Heel Cup Secured With [employer] Sterile or Non-Sterile 6-ply 4.5x4 (yd/yd) Discharge Instruction: Apply Kerlix as directed Tubigrip Size D, 3x10 (in/yd) Compression Wrap Compression Stockings Add-Ons Electronic Signature(s) Signed: 05/08/2023 1:39:07 PM By: Midge Aver MSN RN CNS WTA Entered By: Midge Aver on 05/08/2023 08:23:29 -------------------------------------------------------------------------------- Wound Assessment Details Patient Name: Date of Service: Chad [name] 05/08/2023 10:45 A M Medical Record Number: [id number] Patient Account Number: [id number] Date of Birth/Sex: Treating RN: [birth year] (62 y.o. [name] Primary Care Raygen Dahm: [name] Other Clinician: Referring Zarius Furr: Treating Mariza Bourget/Extender: [name] Weeks in Treatment: 74 Wound Status Wound Number: 18 Primary Pressure Ulcer Etiology: Wound Location: Right, Medial T Second oe Wound Open Wounding Event: Pressure Injury Status: Date Acquired: 09/26/2022 Comorbid Anemia, Chronic Obstructive Pulmonary Disease (COPD), Coronary Weeks Of Treatment: 29 History: Artery Disease, Peripheral Arterial  Pink Fascia Exposed: No Necrotic Amount: Small (1-33%) Fat Layer (Subcutaneous Tissue) Exposed: Yes Necrotic Quality: Adherent Slough Tendon Exposed: No Muscle Exposed: No Joint Exposed:  No Bone Exposed: No Treatment Notes Wound #23 (Lower Leg) Wound Laterality: Right, Posterior Cleanser Soap and Water Discharge Instruction: Gently cleanse wound with antibacterial soap, rinse and pat dry prior to dressing wounds Wound Cleanser Discharge Instruction: Wash your hands with soap and water. Remove old dressing, discard into plastic bag and place into trash. Cleanse the wound with Wound Cleanser prior to applying a clean dressing using gauze sponges, not tissues or cotton balls. Do not scrub or use excessive force. Pat dry using gauze sponges, not tissue or cotton balls. Peri-Wound Care Topical Primary Dressing Silvercel Small 2x2 (in/in) Discharge Instruction: Apply Silvercel Small 2x2 (in/in) as instructed Secondary Dressing Zetuvit Plus 4x8 (in/in) Heel Cup Secured With [employer] Sterile or Non-Sterile 6-ply 4.5x4 (yd/yd) Discharge Instruction: Apply Kerlix as directed Tubigrip Size D, 3x10 (in/yd) Compression Wrap Compression Stockings Add-Ons Electronic Signature(s) Signed: 05/08/2023 1:39:07 PM By: Midge Aver MSN RN CNS Darnelle Going, [name] ([id number]) [id number] Page 15 of 15 Entered By: Midge Aver on 05/08/2023 08:26:41 -------------------------------------------------------------------------------- Vitals Details Patient Name: Date of Service: Chad [name], Chad [name] 05/08/2023 10:45 A M Medical Record Number: [id number] Patient Account Number: [id number] Date of Birth/Sex: Treating RN: 1961/02/06 (62 y.o. [name] Primary Care Kery Haltiwanger: [name] Other Clinician: Referring Endi Lagman: Treating Jake Fuhrmann/Extender: [name] Weeks in Treatment: 74 Vital Signs Time Taken: 11:03 Temperature (F): 98.2 Height (in): 69 Pulse (bpm): 96 Weight (lbs): 150 Respiratory Rate (breaths/min): 18 Body Mass Index (BMI): 22.1 Blood Pressure (mmHg): 96/69 Reference Range: 80 - 120 mg / dl Electronic  Signature(s) Signed: 05/08/2023 1:34:52 PM By: Midge Aver MSN RN CNS WTA Entered By: Midge Aver on 05/08/2023 10:34:52  Tracing (instead of photographs) []  - 0 Simple Wound Measurement - one wound X- 5 5 Complex Wound Measurement - multiple wounds INTERVENTIONS - Wound Dressings []  - 0 Small Wound Dressing one or multiple wounds X- 5 15 Medium Wound Dressing one or multiple wounds []  - 0 Large Wound Dressing one or multiple wounds []  - 0 Application of Medications - topical []  - 0 Application of Medications - injection [name] ([id number]) [id number] Page 3 of 15 INTERVENTIONS - Miscellaneous []  - 0 External ear exam []  - 0 Specimen Collection (cultures, biopsies, blood, body fluids, etc.) []  - 0 Specimen(s) / Culture(s) sent or taken to Lab for analysis []  - 0 Patient Transfer (multiple staff / Michiel Sites Lift / Similar devices) []  - 0 Simple Staple / Suture removal (25 or less) []  - 0 Complex Staple / Suture removal (26 or more) []  - 0 Hypo / Hyperglycemic Management (close monitor of Blood Glucose) []  - 0 Ankle / Brachial Index (ABI) - do not check if billed separately X- 1 5 Vital Signs Has the patient been seen at the hospital within the last three years: Yes Total Score: 220 Level Of Care: New/Established - Level 5 Electronic Signature(s) Signed: 05/08/2023 1:39:07 PM By: Midge Aver MSN RN CNS WTA Entered By: Midge Aver on 05/08/2023 10:36:48 -------------------------------------------------------------------------------- Encounter Discharge Information Details Patient Name: Date of Service: Chad [name] 05/08/2023 10:45 A M Medical Record Number: [id number] Patient Account Number: [id number] Date of Birth/Sex: Treating RN: [birth year] (62 y.o. [name] Primary Care Delois Tolbert: [name] Other Clinician: Referring  Savior Himebaugh: Treating Jonavan Vanhorn/Extender: [name] in Treatment: 7 Encounter Discharge Information Items Discharge Condition: Stable Ambulatory Status: Wheelchair Discharge Destination: Skilled Nursing Facility Telephoned: No Orders Sent: Yes Transportation: Other Accompanied By: self Schedule Follow-up Appointment: Yes Clinical Summary of Care: Electronic Signature(s) Signed: 05/08/2023 1:38:09 PM By: Midge Aver MSN RN CNS WTA Entered By: Midge Aver on 05/08/2023 10:38:09 [name] ([id number]) [id number] Page 4 of 15 -------------------------------------------------------------------------------- Lower Extremity Assessment Details Patient Name: Date of Service: Chad [name], Chad [name] 05/08/2023 10:45 A M Medical Record Number: [id number] Patient Account Number: [id number] Date of Birth/Sex: Treating RN: [birth year] (62 y.o. [name] Primary Care Sigfredo Schreier: [name] Other Clinician: Referring Silviano Neuser: Treating Candiace West/Extender: [name] Weeks in Treatment: 74 Edema Assessment Assessed: [Left: No] Franne Forts: No] [Left: Edema] [Right: :] Calf Left: Right: Point of Measurement: 33 cm From Medial Instep 40.5 cm Ankle Left: Right: Point of Measurement: 12 cm From Medial Instep 24 cm Vascular Assessment Pulses: Dorsalis Pedis Palpable: [Right:Yes] Extremity colors, hair growth, and conditions: Extremity Color: [Right:Hyperpigmented] Hair Growth on Extremity: [Right:No] Temperature of Extremity: [Right:Warm] Capillary Refill: [Right:< 3 seconds] Dependent Rubor: [Right:No] Blanched when Elevated: [Right:No No] Toe Nail Assessment Left: Right: Thick: Yes Discolored: Yes Deformed: Yes Improper Length and Hygiene: Yes Electronic Signature(s) Signed: 05/08/2023 1:35:56 PM By: Midge Aver MSN RN CNS WTA Entered By: Midge Aver on 05/08/2023 10:35:55 [name] ([id number])  [id number] Page 5 of 15 -------------------------------------------------------------------------------- Multi Wound Chart Details Patient Name: Date of Service: Chad [name], Chad [name] 05/08/2023 10:45 A M Medical Record Number: [id number] Patient Account Number: [id number] Date of Birth/Sex: Treating RN: 1960/08/13 (62 y.o. [name] Primary Care Chane Cowden: [name] Other Clinician: Referring Bahar Shelden: Treating Zafiro Routson/Extender: [name] Weeks in Treatment: 88 Vital Signs Height(in): 69 Pulse(bpm): 96 Weight(lbs): 150 Blood Pressure(mmHg): 96/69 Body Mass Index(BMI): 22.1 Temperature(F): 98.2 Respiratory Rate(breaths/min): 18 [16:Photos:] Right Calcaneus Right, Medial T Second oe Right,  Pink Fascia Exposed: No Necrotic Amount: Small (1-33%) Fat Layer (Subcutaneous Tissue) Exposed: Yes Necrotic Quality: Adherent Slough Tendon Exposed: No Muscle Exposed: No Joint Exposed:  No Bone Exposed: No Treatment Notes Wound #23 (Lower Leg) Wound Laterality: Right, Posterior Cleanser Soap and Water Discharge Instruction: Gently cleanse wound with antibacterial soap, rinse and pat dry prior to dressing wounds Wound Cleanser Discharge Instruction: Wash your hands with soap and water. Remove old dressing, discard into plastic bag and place into trash. Cleanse the wound with Wound Cleanser prior to applying a clean dressing using gauze sponges, not tissues or cotton balls. Do not scrub or use excessive force. Pat dry using gauze sponges, not tissue or cotton balls. Peri-Wound Care Topical Primary Dressing Silvercel Small 2x2 (in/in) Discharge Instruction: Apply Silvercel Small 2x2 (in/in) as instructed Secondary Dressing Zetuvit Plus 4x8 (in/in) Heel Cup Secured With [employer] Sterile or Non-Sterile 6-ply 4.5x4 (yd/yd) Discharge Instruction: Apply Kerlix as directed Tubigrip Size D, 3x10 (in/yd) Compression Wrap Compression Stockings Add-Ons Electronic Signature(s) Signed: 05/08/2023 1:39:07 PM By: Midge Aver MSN RN CNS Darnelle Going, [name] ([id number]) [id number] Page 15 of 15 Entered By: Midge Aver on 05/08/2023 08:26:41 -------------------------------------------------------------------------------- Vitals Details Patient Name: Date of Service: Chad [name], Chad [name] 05/08/2023 10:45 A M Medical Record Number: [id number] Patient Account Number: [id number] Date of Birth/Sex: Treating RN: 1961/02/06 (62 y.o. [name] Primary Care Kery Haltiwanger: [name] Other Clinician: Referring Endi Lagman: Treating Jake Fuhrmann/Extender: [name] Weeks in Treatment: 74 Vital Signs Time Taken: 11:03 Temperature (F): 98.2 Height (in): 69 Pulse (bpm): 96 Weight (lbs): 150 Respiratory Rate (breaths/min): 18 Body Mass Index (BMI): 22.1 Blood Pressure (mmHg): 96/69 Reference Range: 80 - 120 mg / dl Electronic  Signature(s) Signed: 05/08/2023 1:34:52 PM By: Midge Aver MSN RN CNS WTA Entered By: Midge Aver on 05/08/2023 10:34:52

## 2023-05-08 NOTE — Progress Notes (Addendum)
contact our office for additional recommendations. Electronic Signature(s) Signed: 05/08/2023 1:57:16 PM By: [name] PA-C Entered By: [name] on 05/08/2023  10:57:16 -------------------------------------------------------------------------------- SuperBill Details Patient Name: Date of Service: Chad [name], Chad [name] 05/08/2023 Medical Record Number: [id number] Patient Account Number: [id number] Date of Birth/Sex: Treating RN: [birth year] (62 y.o. 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 with other specified severity Chad [name], Chad [name] ([id number]) [id number] Page 17 of 17 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 foot 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, stage 3 Facility Procedures : CPT4 Code: 46962952 Description: 84132 - WOUND CARE VISIT-LEV 5 EST PT Modifier: Quantity: 1 Physician Procedures : CPT4 Code Description Modifier [id number] - WC PHYS LEVEL 3 - EST PT ICD-10 Diagnosis Description 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 (idiopathic) with ulcer and inflammation of left lower extremity L98.492 Non-pressure chronic ulcer of skin of  other sites with fat layer exposed 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 Quantity: 1 Electronic Signature(s) Signed: 05/08/2023 1:58:53 PM By: [name] PA-C Previous Signature: 05/08/2023 1:36:55 PM Version By: Midge Aver MSN RN CNS WTA Entered By: [name] on 05/08/2023 10:58:53  D, 3x10 (in/yd) 1 x Per Day/30 Days Electronic Signature(s) Signed: 05/08/2023 1:39:07 PM By: Midge Aver MSN RN CNS WTA Signed: 05/08/2023 2:05:20 PM By: [name] PA-C Entered By: Midge Aver on 05/08/2023 10:36:43 Chad [name] ([id number]) [id number] Page 9 of 17 -------------------------------------------------------------------------------- Problem List Details Patient Name: Date of Service: Chad [name], Chad [name] 05/08/2023 10:45 A M Medical Record Number: [id number] Patient Account Number: [id number] Date of Birth/Sex: Treating RN: 19-Jun-1961 (62 y.o. 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 02/19/2023 No Yes foot 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, stage 3 10/07/2022 No Yes Inactive Problems ICD-10 Code Description Active Date Inactive Date L97.322 Non-pressure chronic ulcer of left ankle with fat layer exposed 12/06/2021 12/06/2021 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 [name] Primary Care Provider: [name] Other Clinician: Referring Provider: Treating Provider/Extender: [name] Weeks in Treatment: 63 Subjective Chief Complaint Information obtained from Patient Left ankle ulcer, Left dorsal foot wound, right heel History of Present Illness (HPI) 10/08/18 on evaluation today patient actually presents to our office for initial evaluation concerning wounds that he has of the bilateral lower extremities. 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 He also has bilateral venous stasis. He apparently has been homeless currently following discharge from the hospital apparently he has been placed at [facility] which is is a skilled nursing facility locally. Nonetheless fortunately he does not show any signs of infection at this time which is good news. In fact several of the wound actually appears to be showing some signs of improvement already in my pinion. There are a couple areas in the left leg in particular there likely gonna require some sharp debridement to help clear away some necrotic tissue and help with more sufficient healing. No fevers, chills, nausea, or vomiting noted at this time. 10/15/18 on evaluation today patient actually appears to be doing very well in regard to his bilateral lower extremities. He's been tolerating the dressing changes without complication. Fortunately there does not appear to be any evidence of active infection at this time which is great news. Overall I'm actually very pleased with how this has progressed in just one visits time. Readmission: 08/14/2020 upon evaluation today patient presents for re-evaluation here in our clinic. He is having issues with his left ankle region as well as his right toe and his right heel. 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 No doubt this has a lot to do with the discomfort in the right leg he describes when he is in bed. They have been using Xeroform on the wounds on the right. He has a lot of swelling in this leg with weeping edema 04-09-2023 upon evaluation today patient appears to be doing poorly still in regard to his right leg. 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  Fortunately I do not see any signs of active infection locally or systemically at this time which is great news. No fevers, chills, nausea, vomiting, or diarrhea. With that being said I am concerned still about the fact that he seems to be getting worse especially in regard to the heels I think that he is still sitting up most of the day which I think is still causing some swelling of his leg unfortunately. 04-16-2023 upon evaluation today patient actually appears to be doing little bit more poorly compared to last week this actually does appear to be infected. Last week I really did not think that was the case but this seems like it may indeed be the case. Fortunately I do not see any signs of active infection locally or systemically at this time. 04-24-2023 upon evaluation today patient appears to be doing well currently in regard to his leg compared to where we were previous I do feel like that the antibiotics have been of benefit. Fortunately I do not see any signs of active infection at this time which is great news and in general I believe that we are making headway towards healing which is great news as well. With that being said he still has a lot of drainage I really feel like something like Anola Gurney would be better for him than just an ABD pad it would likely the drainage more or even Zetuvit's but I am not sure whether we can get either 1 of these at the facility for him. 05-01-2023 upon evaluation today patient appears to be doing well currently in regard to his wounds which are showing signs of significant improvement is not any way shape or form infected like it was last time I saw him. I think the antibiotics are doing a good job but I think he may need an extension here to ensure Chad [name], Chad [name] ([id number]) [id number] Page 6 of 17 that it completely clears. 05-08-2023 upon evaluation today patient appears to be doing well currently in regard to his wounds for  the most part. He still has blue-green drainage noted but in general seems to be doing much better compared to where we were previous. Fortunately I do not see any signs of active infection locally or systemically which is great news and in general I do believe that we are making good headway towards complete closure. Electronic Signature(s) Signed: 05/08/2023 1:56:22 PM By: [name] PA-C Entered By: [name] on 05/08/2023 10:56:22 -------------------------------------------------------------------------------- Physical Exam Details Patient Name: Date of Service: Chad [name], Chad [name] 05/08/2023 10:45 A M Medical Record Number: [id number] Patient Account Number: [id number] Date of Birth/Sex: Treating RN: 03-16-1961 (62 y.o. [name] Primary Care Provider: [name] Other Clinician: Referring Provider: Treating Provider/Extender: [name] Weeks in Treatment: 53 Constitutional Well-nourished and well-hydrated in no acute distress. Respiratory normal breathing without difficulty. Psychiatric this patient is able to make decisions and demonstrates good insight into disease process. Alert and Oriented x  contact our office for additional recommendations. Electronic Signature(s) Signed: 05/08/2023 1:57:16 PM By: [name] PA-C Entered By: [name] on 05/08/2023  10:57:16 -------------------------------------------------------------------------------- SuperBill Details Patient Name: Date of Service: Chad [name], Chad [name] 05/08/2023 Medical Record Number: [id number] Patient Account Number: [id number] Date of Birth/Sex: Treating RN: [birth year] (62 y.o. 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 with other specified severity Chad [name], Chad [name] ([id number]) [id number] Page 17 of 17 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 foot 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, stage 3 Facility Procedures : CPT4 Code: 46962952 Description: 84132 - WOUND CARE VISIT-LEV 5 EST PT Modifier: Quantity: 1 Physician Procedures : CPT4 Code Description Modifier [id number] 99213 - WC PHYS LEVEL 3 - EST PT ICD-10 Diagnosis Description 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 (idiopathic) with ulcer and inflammation of left lower extremity L98.492 Non-pressure chronic ulcer of skin of  other sites with fat layer exposed 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 Quantity: 1 Electronic Signature(s) Signed: 05/08/2023 1:58:53 PM By: [name] PA-C Previous Signature: 05/08/2023 1:36:55 PM Version By: Midge Aver MSN RN CNS WTA Entered By: [name] on 05/08/2023 10:58:53  Chad [name], Chad [name] ([id number]) [id number] Page 1 of 17 Visit Report for 05/08/2023 Chief Complaint Document Details Patient Name: Date of Service: Chad [name], Chad [name] 05/08/2023 10:45 A M Medical Record Number: [id number] Patient Account Number: [id number] Date of Birth/Sex: Treating RN: 24-Mar-1961 (62 y.o. [name] Primary Care Provider: [name] Other Clinician: Referring Provider: Treating Provider/Extender: [name] Weeks in Treatment: 53 Information Obtained from: Patient Chief Complaint Left ankle ulcer, Left dorsal foot wound, right heel Electronic Signature(s) Signed: 05/08/2023 11:01:22 AM By: [name] PA-C Entered By: [name] on 05/08/2023 08:01:22 -------------------------------------------------------------------------------- HPI Details Patient Name: Date of Service: Chad [name] 05/08/2023 10:45 A M Medical Record Number: [id number] Patient Account Number: [id number] Date of Birth/Sex: Treating RN: 1960-07-30 (62 y.o. [name] Primary Care Provider: [name] Other Clinician: Referring Provider: Treating Provider/Extender: [name] in Treatment: 75 History of Present Illness HPI Description: 10/08/18 on evaluation today patient actually presents to our office for initial evaluation concerning wounds that he has of the bilateral lower extremities. 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 He also has bilateral venous stasis. He apparently has been homeless currently following discharge from the hospital apparently he has been placed at [facility] which is is a skilled nursing facility locally. Nonetheless fortunately he does not show any signs of infection at this time which is good news. In fact several of the  wound actually appears to be showing some signs of improvement already in my pinion. There are a couple areas in the left leg in particular there likely gonna require some sharp debridement to help clear away some necrotic tissue and help with more sufficient healing. No fevers, chills, nausea, or vomiting noted at this time. 10/15/18 on evaluation today patient actually appears to be doing very well in regard to his bilateral lower extremities. He's been tolerating the dressing changes without complication. Fortunately there does not appear to be any evidence of active infection at this time which is great news. Overall I'm actually very pleased with how this has progressed in just one visits time. Readmission: 08/14/2020 upon evaluation today patient presents for re-evaluation here in our clinic. He is having issues with his left ankle region as well as his right toe and his right heel. He tells me that the toe and heel actually began as a area that was itching that he was scratching and then subsequently opened up into wounds. These may have been abscess areas I presume based on what I am seeing currently. With regard to his left ankle region he tells me this was a similar type Chad [name], Chad [name] ([id number]) [id number] Page 2 of 17 occurrence although he does have venous stasis this very well may be more of a venous leg ulcer more than anything. Nonetheless I do believe that the patient would benefit from appropriate and aggressive wound care to try to help get things under better control here. He does have history of a stroke on the left side affecting him to some degree there that he is able to stand although he does have some residual weakness. Otherwise again the patient does have chronic venous insufficiency as previously noted. 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 No doubt this has a lot to do with the discomfort in the right leg he describes when he is in bed. They have been using Xeroform on the wounds on the right. He has a lot of swelling in this leg with weeping edema 04-09-2023 upon evaluation today patient appears to be doing poorly still in regard to his right leg. 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  Fortunately I do not see any signs of active infection locally or systemically at this time which is great news. No fevers, chills, nausea, vomiting, or diarrhea. With that being said I am concerned still about the fact that he seems to be getting worse especially in regard to the heels I think that he is still sitting up most of the day which I think is still causing some swelling of his leg unfortunately. 04-16-2023 upon evaluation today patient actually appears to be doing little bit more poorly compared to last week this actually does appear to be infected. Last week I really did not think that was the case but this seems like it may indeed be the case. Fortunately I do not see any signs of active infection locally or systemically at this time. 04-24-2023 upon evaluation today patient appears to be doing well currently in regard to his leg compared to where we were previous I do feel like that the antibiotics have been of benefit. Fortunately I do not see any signs of active infection at this time which is great news and in general I believe that we are making headway towards healing which is great news as well. With that being said he still has a lot of drainage I really feel like something like Anola Gurney would be better for him than just an ABD pad it would likely the drainage more or even Zetuvit's but I am not sure whether we can get either 1 of these at the facility for him. 05-01-2023 upon evaluation today patient appears to be doing well currently in regard to his wounds which are showing signs of significant improvement is not any way shape or form infected like it was last time I saw him. I think the antibiotics are doing a good job but I think he may need an extension here to ensure Chad [name], Chad [name] ([id number]) [id number] Page 6 of 17 that it completely clears. 05-08-2023 upon evaluation today patient appears to be doing well currently in regard to his wounds for  the most part. He still has blue-green drainage noted but in general seems to be doing much better compared to where we were previous. Fortunately I do not see any signs of active infection locally or systemically which is great news and in general I do believe that we are making good headway towards complete closure. Electronic Signature(s) Signed: 05/08/2023 1:56:22 PM By: [name] PA-C Entered By: [name] on 05/08/2023 10:56:22 -------------------------------------------------------------------------------- Physical Exam Details Patient Name: Date of Service: Chad [name], Chad [name] 05/08/2023 10:45 A M Medical Record Number: [id number] Patient Account Number: [id number] Date of Birth/Sex: Treating RN: 03-16-1961 (62 y.o. [name] Primary Care Provider: [name] Other Clinician: Referring Provider: Treating Provider/Extender: [name] Weeks in Treatment: 53 Constitutional Well-nourished and well-hydrated in no acute distress. Respiratory normal breathing without difficulty. Psychiatric this patient is able to make decisions and demonstrates good insight into disease process. Alert and Oriented x  3. pleasant and cooperative. Notes Upon inspection patient's wound bed actually showed signs of good granulation and epithelization at this point. Fortunately I do not see any signs of worsening for the most part although he still has a lot of drainage they need to make sure to get the XtraSorb exactly over the areas where this drainage is I think is doing much better control and this compared to just ABD pads I am very pleased in that regard. Electronic Signature(s) Signed: 05/08/2023 1:56:51 PM By: [name] PA-C Entered By: [name] on 05/08/2023 10:56:50 -------------------------------------------------------------------------------- Physician Orders Details Patient Name: Date of Service: Chad [name] 05/08/2023 10:45 A M Medical Record Number:  [id number] Patient Account Number: [id number] Date of Birth/Sex: Treating RN: 1961/03/02 (62 y.o. 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 Nurse Visit as needed Bathing/ Shower/ Hygiene Clean wound with Normal Saline or wound cleanser. No tub bath. Anesthetic (Use 'Patient Medications' Section for Anesthetic Order Entry) Lidocaine applied to wound bed Edema Control - Lymphedema / Segmental Compressive Device / Other Tubigrip single layer applied. - Size D Elevate legs to the level of the heart and pump ankles as often as possible Elevate leg(s) parallel to the floor when sitting. Other: - PATIENT MUST SLEEP IN A BED. Legs are swelling due to no elevation of legs. 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 ntibiotics - RX renewed 05/01/2023 P.O. A Wound Treatment Wound #16 - Calcaneus Wound Laterality: Right Cleanser: Soap and Water 1 x Per Day/30 Days Discharge Instructions: Gently cleanse wound with antibacterial soap, rinse and pat dry prior to dressing wounds Cleanser: Wound Cleanser 1 x Per Day/30 Days Discharge Instructions: Wash your hands  with soap and water. Remove old dressing, discard into plastic bag and place into trash. Cleanse the wound with Wound Cleanser prior to applying a clean dressing using gauze sponges, not tissues or cotton balls. Do not scrub or use excessive force. Pat dry using gauze sponges, not tissue or cotton balls. Prim Dressing: Silvercel Small 2x2 (in/in) 1 x Per Day/30 Days ary Discharge Instructions: Apply Silvercel Small 2x2 (in/in) as instructed Secondary Dressing: Zetuvit Plus 4x8 (in/in) 1 x Per Day/30 Days Secondary Dressing: Heel Cup 1 x Per Day/30 Days Secured With: Kerlix Roll Sterile or Non-Sterile 6-ply 4.5x4 (yd/yd) 1 x Per Day/30 Days Discharge Instructions: Apply Kerlix as directed Secured With: Tubigrip Size D, 3x10 (in/yd) 1 x Per Day/30 Days Wound #18 - T Second oe Wound Laterality: Right, Medial Cleanser: Soap and Water 1 x Per Day/30 Days Discharge Instructions: Gently cleanse wound with antibacterial soap, rinse and pat dry prior to dressing wounds Cleanser: Wound Cleanser 1 x Per Day/30 Days Discharge Instructions: Wash your hands with soap and water. Remove old dressing, discard into plastic bag and place into trash. 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 No doubt this has a lot to do with the discomfort in the right leg he describes when he is in bed. They have been using Xeroform on the wounds on the right. He has a lot of swelling in this leg with weeping edema 04-09-2023 upon evaluation today patient appears to be doing poorly still in regard to his right leg. 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  Fortunately I do not see any signs of active infection locally or systemically at this time which is great news. No fevers, chills, nausea, vomiting, or diarrhea. With that being said I am concerned still about the fact that he seems to be getting worse especially in regard to the heels I think that he is still sitting up most of the day which I think is still causing some swelling of his leg unfortunately. 04-16-2023 upon evaluation today patient actually appears to be doing little bit more poorly compared to last week this actually does appear to be infected. Last week I really did not think that was the case but this seems like it may indeed be the case. Fortunately I do not see any signs of active infection locally or systemically at this time. 04-24-2023 upon evaluation today patient appears to be doing well currently in regard to his leg compared to where we were previous I do feel like that the antibiotics have been of benefit. Fortunately I do not see any signs of active infection at this time which is great news and in general I believe that we are making headway towards healing which is great news as well. With that being said he still has a lot of drainage I really feel like something like Anola Gurney would be better for him than just an ABD pad it would likely the drainage more or even Zetuvit's but I am not sure whether we can get either 1 of these at the facility for him. 05-01-2023 upon evaluation today patient appears to be doing well currently in regard to his wounds which are showing signs of significant improvement is not any way shape or form infected like it was last time I saw him. I think the antibiotics are doing a good job but I think he may need an extension here to ensure Chad [name], Chad [name] ([id number]) [id number] Page 6 of 17 that it completely clears. 05-08-2023 upon evaluation today patient appears to be doing well currently in regard to his wounds for  the most part. He still has blue-green drainage noted but in general seems to be doing much better compared to where we were previous. Fortunately I do not see any signs of active infection locally or systemically which is great news and in general I do believe that we are making good headway towards complete closure. Electronic Signature(s) Signed: 05/08/2023 1:56:22 PM By: [name] PA-C Entered By: [name] on 05/08/2023 10:56:22 -------------------------------------------------------------------------------- Physical Exam Details Patient Name: Date of Service: Chad [name], Chad [name] 05/08/2023 10:45 A M Medical Record Number: [id number] Patient Account Number: [id number] Date of Birth/Sex: Treating RN: 03-16-1961 (62 y.o. [name] Primary Care Provider: [name] Other Clinician: Referring Provider: Treating Provider/Extender: [name] Weeks in Treatment: 53 Constitutional Well-nourished and well-hydrated in no acute distress. Respiratory normal breathing without difficulty. Psychiatric this patient is able to make decisions and demonstrates good insight into disease process. Alert and Oriented x  3. pleasant and cooperative. Notes Upon inspection patient's wound bed actually showed signs of good granulation and epithelization at this point. Fortunately I do not see any signs of worsening for the most part although he still has a lot of drainage they need to make sure to get the XtraSorb exactly over the areas where this drainage is I think is doing much better control and this compared to just ABD pads I am very pleased in that regard. Electronic Signature(s) Signed: 05/08/2023 1:56:51 PM By: [name] PA-C Entered By: [name] on 05/08/2023 10:56:50 -------------------------------------------------------------------------------- Physician Orders Details Patient Name: Date of Service: Chad [name] 05/08/2023 10:45 A M Medical Record Number:  [id number] Patient Account Number: [id number] Date of Birth/Sex: Treating RN: 1961/03/02 (62 y.o. 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 Nurse Visit as needed Bathing/ Shower/ Hygiene Clean wound with Normal Saline or wound cleanser. No tub bath. Anesthetic (Use 'Patient Medications' Section for Anesthetic Order Entry) Lidocaine applied to wound bed Edema Control - Lymphedema / Segmental Compressive Device / Other Tubigrip single layer applied. - Size D Elevate legs to the level of the heart and pump ankles as often as possible Elevate leg(s) parallel to the floor when sitting. Other: - PATIENT MUST SLEEP IN A BED. Legs are swelling due to no elevation of legs. 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 ntibiotics - RX renewed 05/01/2023 P.O. A Wound Treatment Wound #16 - Calcaneus Wound Laterality: Right Cleanser: Soap and Water 1 x Per Day/30 Days Discharge Instructions: Gently cleanse wound with antibacterial soap, rinse and pat dry prior to dressing wounds Cleanser: Wound Cleanser 1 x Per Day/30 Days Discharge Instructions: Wash your hands  with soap and water. Remove old dressing, discard into plastic bag and place into trash. Cleanse the wound with Wound Cleanser prior to applying a clean dressing using gauze sponges, not tissues or cotton balls. Do not scrub or use excessive force. Pat dry using gauze sponges, not tissue or cotton balls. Prim Dressing: Silvercel Small 2x2 (in/in) 1 x Per Day/30 Days ary Discharge Instructions: Apply Silvercel Small 2x2 (in/in) as instructed Secondary Dressing: Zetuvit Plus 4x8 (in/in) 1 x Per Day/30 Days Secondary Dressing: Heel Cup 1 x Per Day/30 Days Secured With: Kerlix Roll Sterile or Non-Sterile 6-ply 4.5x4 (yd/yd) 1 x Per Day/30 Days Discharge Instructions: Apply Kerlix as directed Secured With: Tubigrip Size D, 3x10 (in/yd) 1 x Per Day/30 Days Wound #18 - T Second oe Wound Laterality: Right, Medial Cleanser: Soap and Water 1 x Per Day/30 Days Discharge Instructions: Gently cleanse wound with antibacterial soap, rinse and pat dry prior to dressing wounds Cleanser: Wound Cleanser 1 x Per Day/30 Days Discharge Instructions: Wash your hands with soap and water. Remove old dressing, discard into plastic bag and place into trash. Cleanse the wound with  D, 3x10 (in/yd) 1 x Per Day/30 Days Electronic Signature(s) Signed: 05/08/2023 1:39:07 PM By: Midge Aver MSN RN CNS WTA Signed: 05/08/2023 2:05:20 PM By: [name] PA-C Entered By: Midge Aver on 05/08/2023 10:36:43 Chad [name] ([id number]) [id number] Page 9 of 17 -------------------------------------------------------------------------------- Problem List Details Patient Name: Date of Service: Chad [name], Chad [name] 05/08/2023 10:45 A M Medical Record Number: [id number] Patient Account Number: [id number] Date of Birth/Sex: Treating RN: 19-Jun-1961 (62 y.o. 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 02/19/2023 No Yes foot 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, stage 3 10/07/2022 No Yes Inactive Problems ICD-10 Code Description Active Date Inactive Date L97.322 Non-pressure chronic ulcer of left ankle with fat layer exposed 12/06/2021 12/06/2021 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 [name] Primary Care Provider: [name] Other Clinician: Referring Provider: Treating Provider/Extender: [name] Weeks in Treatment: 63 Subjective Chief Complaint Information obtained from Patient Left ankle ulcer, Left dorsal foot wound, right heel History of Present Illness (HPI) 10/08/18 on evaluation today patient actually presents to our office for initial evaluation concerning wounds that he has of the bilateral lower extremities. 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 He also has bilateral venous stasis. He apparently has been homeless currently following discharge from the hospital apparently he has been placed at [facility] which is is a skilled nursing facility locally. Nonetheless fortunately he does not show any signs of infection at this time which is good news. In fact several of the wound actually appears to be showing some signs of improvement already in my pinion. There are a couple areas in the left leg in particular there likely gonna require some sharp debridement to help clear away some necrotic tissue and help with more sufficient healing. No fevers, chills, nausea, or vomiting noted at this time. 10/15/18 on evaluation today patient actually appears to be doing very well in regard to his bilateral lower extremities. He's been tolerating the dressing changes without complication. Fortunately there does not appear to be any evidence of active infection at this time which is great news. Overall I'm actually very pleased with how this has progressed in just one visits time. Readmission: 08/14/2020 upon evaluation today patient presents for re-evaluation here in our clinic. He is having issues with his left ankle region as well as his right toe and his right heel. 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 No doubt this has a lot to do with the discomfort in the right leg he describes when he is in bed. They have been using Xeroform on the wounds on the right. He has a lot of swelling in this leg with weeping edema 04-09-2023 upon evaluation today patient appears to be doing poorly still in regard to his right leg. 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  Fortunately I do not see any signs of active infection locally or systemically at this time which is great news. No fevers, chills, nausea, vomiting, or diarrhea. With that being said I am concerned still about the fact that he seems to be getting worse especially in regard to the heels I think that he is still sitting up most of the day which I think is still causing some swelling of his leg unfortunately. 04-16-2023 upon evaluation today patient actually appears to be doing little bit more poorly compared to last week this actually does appear to be infected. Last week I really did not think that was the case but this seems like it may indeed be the case. Fortunately I do not see any signs of active infection locally or systemically at this time. 04-24-2023 upon evaluation today patient appears to be doing well currently in regard to his leg compared to where we were previous I do feel like that the antibiotics have been of benefit. Fortunately I do not see any signs of active infection at this time which is great news and in general I believe that we are making headway towards healing which is great news as well. With that being said he still has a lot of drainage I really feel like something like Anola Gurney would be better for him than just an ABD pad it would likely the drainage more or even Zetuvit's but I am not sure whether we can get either 1 of these at the facility for him. 05-01-2023 upon evaluation today patient appears to be doing well currently in regard to his wounds which are showing signs of significant improvement is not any way shape or form infected like it was last time I saw him. I think the antibiotics are doing a good job but I think he may need an extension here to ensure Chad [name], Chad [name] ([id number]) [id number] Page 6 of 17 that it completely clears. 05-08-2023 upon evaluation today patient appears to be doing well currently in regard to his wounds for  the most part. He still has blue-green drainage noted but in general seems to be doing much better compared to where we were previous. Fortunately I do not see any signs of active infection locally or systemically which is great news and in general I do believe that we are making good headway towards complete closure. Electronic Signature(s) Signed: 05/08/2023 1:56:22 PM By: [name] PA-C Entered By: [name] on 05/08/2023 10:56:22 -------------------------------------------------------------------------------- Physical Exam Details Patient Name: Date of Service: Chad [name], Chad [name] 05/08/2023 10:45 A M Medical Record Number: [id number] Patient Account Number: [id number] Date of Birth/Sex: Treating RN: 03-16-1961 (62 y.o. [name] Primary Care Provider: [name] Other Clinician: Referring Provider: Treating Provider/Extender: [name] Weeks in Treatment: 53 Constitutional Well-nourished and well-hydrated in no acute distress. Respiratory normal breathing without difficulty. Psychiatric this patient is able to make decisions and demonstrates good insight into disease process. 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 No doubt this has a lot to do with the discomfort in the right leg he describes when he is in bed. They have been using Xeroform on the wounds on the right. He has a lot of swelling in this leg with weeping edema 04-09-2023 upon evaluation today patient appears to be doing poorly still in regard to his right leg. 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  Fortunately I do not see any signs of active infection locally or systemically at this time which is great news. No fevers, chills, nausea, vomiting, or diarrhea. With that being said I am concerned still about the fact that he seems to be getting worse especially in regard to the heels I think that he is still sitting up most of the day which I think is still causing some swelling of his leg unfortunately. 04-16-2023 upon evaluation today patient actually appears to be doing little bit more poorly compared to last week this actually does appear to be infected. Last week I really did not think that was the case but this seems like it may indeed be the case. Fortunately I do not see any signs of active infection locally or systemically at this time. 04-24-2023 upon evaluation today patient appears to be doing well currently in regard to his leg compared to where we were previous I do feel like that the antibiotics have been of benefit. Fortunately I do not see any signs of active infection at this time which is great news and in general I believe that we are making headway towards healing which is great news as well. With that being said he still has a lot of drainage I really feel like something like Anola Gurney would be better for him than just an ABD pad it would likely the drainage more or even Zetuvit's but I am not sure whether we can get either 1 of these at the facility for him. 05-01-2023 upon evaluation today patient appears to be doing well currently in regard to his wounds which are showing signs of significant improvement is not any way shape or form infected like it was last time I saw him. I think the antibiotics are doing a good job but I think he may need an extension here to ensure Chad [name], Chad [name] ([id number]) [id number] Page 6 of 17 that it completely clears. 05-08-2023 upon evaluation today patient appears to be doing well currently in regard to his wounds for  the most part. He still has blue-green drainage noted but in general seems to be doing much better compared to where we were previous. Fortunately I do not see any signs of active infection locally or systemically which is great news and in general I do believe that we are making good headway towards complete closure. Electronic Signature(s) Signed: 05/08/2023 1:56:22 PM By: [name] PA-C Entered By: [name] on 05/08/2023 10:56:22 -------------------------------------------------------------------------------- Physical Exam Details Patient Name: Date of Service: Chad [name], Chad [name] 05/08/2023 10:45 A M Medical Record Number: [id number] Patient Account Number: [id number] Date of Birth/Sex: Treating RN: 03-16-1961 (62 y.o. [name] Primary Care Provider: [name] Other Clinician: Referring Provider: Treating Provider/Extender: [name] Weeks in Treatment: 53 Constitutional Well-nourished and well-hydrated in no acute distress. Respiratory normal breathing without difficulty. Psychiatric this patient is able to make decisions and demonstrates good insight into disease process. Alert and Oriented x  contact our office for additional recommendations. Electronic Signature(s) Signed: 05/08/2023 1:57:16 PM By: [name] PA-C Entered By: [name] on 05/08/2023  10:57:16 -------------------------------------------------------------------------------- SuperBill Details Patient Name: Date of Service: Chad [name], Chad [name] 05/08/2023 Medical Record Number: [id number] Patient Account Number: [id number] Date of Birth/Sex: Treating RN: [birth year] (62 y.o. 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 with other specified severity Chad [name], Chad [name] ([id number]) [id number] Page 17 of 17 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 foot 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, stage 3 Facility Procedures : CPT4 Code: [id number] Description: 84132 - WOUND CARE VISIT-LEV 5 EST PT Modifier: Quantity: 1 Physician Procedures : CPT4 Code Description Modifier [id number] - WC PHYS LEVEL 3 - EST PT ICD-10 Diagnosis Description 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 (idiopathic) with ulcer and inflammation of left lower extremity L98.492 Non-pressure chronic ulcer of skin of  other sites with fat layer exposed 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 Quantity: 1 Electronic Signature(s) Signed: 05/08/2023 1:58:53 PM By: [name] PA-C Previous Signature: 05/08/2023 1:36:55 PM Version By: Midge Aver MSN RN CNS WTA Entered By: [name] on 05/08/2023 10:58:53  Chad [name], Chad [name] ([id number]) [id number] Page 1 of 17 Visit Report for 05/08/2023 Chief Complaint Document Details Patient Name: Date of Service: Chad [name], Chad [name] 05/08/2023 10:45 A M Medical Record Number: [id number] Patient Account Number: [id number] Date of Birth/Sex: Treating RN: 24-Mar-1961 (62 y.o. [name] Primary Care Provider: [name] Other Clinician: Referring Provider: Treating Provider/Extender: [name] Weeks in Treatment: 53 Information Obtained from: Patient Chief Complaint Left ankle ulcer, Left dorsal foot wound, right heel Electronic Signature(s) Signed: 05/08/2023 11:01:22 AM By: [name] PA-C Entered By: [name] on 05/08/2023 08:01:22 -------------------------------------------------------------------------------- HPI Details Patient Name: Date of Service: Chad [name] 05/08/2023 10:45 A M Medical Record Number: [id number] Patient Account Number: [id number] Date of Birth/Sex: Treating RN: 1960-07-30 (62 y.o. [name] Primary Care Provider: [name] Other Clinician: Referring Provider: Treating Provider/Extender: [name] in Treatment: 75 History of Present Illness HPI Description: 10/08/18 on evaluation today patient actually presents to our office for initial evaluation concerning wounds that he has of the bilateral lower extremities. 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 He also has bilateral venous stasis. He apparently has been homeless currently following discharge from the hospital apparently he has been placed at [facility] which is is a skilled nursing facility locally. Nonetheless fortunately he does not show any signs of infection at this time which is good news. In fact several of the  wound actually appears to be showing some signs of improvement already in my pinion. There are a couple areas in the left leg in particular there likely gonna require some sharp debridement to help clear away some necrotic tissue and help with more sufficient healing. No fevers, chills, nausea, or vomiting noted at this time. 10/15/18 on evaluation today patient actually appears to be doing very well in regard to his bilateral lower extremities. He's been tolerating the dressing changes without complication. Fortunately there does not appear to be any evidence of active infection at this time which is great news. Overall I'm actually very pleased with how this has progressed in just one visits time. Readmission: 08/14/2020 upon evaluation today patient presents for re-evaluation here in our clinic. He is having issues with his left ankle region as well as his right toe and his right heel. He tells me that the toe and heel actually began as a area that was itching that he was scratching and then subsequently opened up into wounds. These may have been abscess areas I presume based on what I am seeing currently. With regard to his left ankle region he tells me this was a similar type Chad [name], Chad [name] ([id number]) [id number] Page 2 of 17 occurrence although he does have venous stasis this very well may be more of a venous leg ulcer more than anything. Nonetheless I do believe that the patient would benefit from appropriate and aggressive wound care to try to help get things under better control here. He does have history of a stroke on the left side affecting him to some degree there that he is able to stand although he does have some residual weakness. Otherwise again the patient does have chronic venous insufficiency as previously noted. His arterial studies most recently obtained showed that he had an ABI on the right of 1.16 with a TBI of 0.52 and on the left and ABI of 1.14  3. pleasant and cooperative. Notes Upon inspection patient's wound bed actually showed signs of good granulation and epithelization at this point. Fortunately I do not see any signs of worsening for the most part although he still has a lot of drainage they need to make sure to get the XtraSorb exactly over the areas where this drainage is I think is doing much better control and this compared to just ABD pads I am very pleased in that regard. Electronic Signature(s) Signed: 05/08/2023 1:56:51 PM By: [name] PA-C Entered By: [name] on 05/08/2023 10:56:50 -------------------------------------------------------------------------------- Physician Orders Details Patient Name: Date of Service: Chad [name] 05/08/2023 10:45 A M Medical Record Number:  [id number] Patient Account Number: [id number] Date of Birth/Sex: Treating RN: 1961/03/02 (62 y.o. 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 Nurse Visit as needed Bathing/ Shower/ Hygiene Clean wound with Normal Saline or wound cleanser. No tub bath. Anesthetic (Use 'Patient Medications' Section for Anesthetic Order Entry) Lidocaine applied to wound bed Edema Control - Lymphedema / Segmental Compressive Device / Other Tubigrip single layer applied. - Size D Elevate legs to the level of the heart and pump ankles as often as possible Elevate leg(s) parallel to the floor when sitting. Other: - PATIENT MUST SLEEP IN A BED. Legs are swelling due to no elevation of legs. 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 ntibiotics - RX renewed 05/01/2023 P.O. A Wound Treatment Wound #16 - Calcaneus Wound Laterality: Right Cleanser: Soap and Water 1 x Per Day/30 Days Discharge Instructions: Gently cleanse wound with antibacterial soap, rinse and pat dry prior to dressing wounds Cleanser: Wound Cleanser 1 x Per Day/30 Days Discharge Instructions: Wash your hands  with soap and water. Remove old dressing, discard into plastic bag and place into trash. Cleanse the wound with Wound Cleanser prior to applying a clean dressing using gauze sponges, not tissues or cotton balls. Do not scrub or use excessive force. Pat dry using gauze sponges, not tissue or cotton balls. Prim Dressing: Silvercel Small 2x2 (in/in) 1 x Per Day/30 Days ary Discharge Instructions: Apply Silvercel Small 2x2 (in/in) as instructed Secondary Dressing: Zetuvit Plus 4x8 (in/in) 1 x Per Day/30 Days Secondary Dressing: Heel Cup 1 x Per Day/30 Days Secured With: Kerlix Roll Sterile or Non-Sterile 6-ply 4.5x4 (yd/yd) 1 x Per Day/30 Days Discharge Instructions: Apply Kerlix as directed Secured With: Tubigrip Size D, 3x10 (in/yd) 1 x Per Day/30 Days Wound #18 - T Second oe Wound Laterality: Right, Medial Cleanser: Soap and Water 1 x Per Day/30 Days Discharge Instructions: Gently cleanse wound with antibacterial soap, rinse and pat dry prior to dressing wounds Cleanser: Wound Cleanser 1 x Per Day/30 Days Discharge Instructions: Wash your hands with soap and water. Remove old dressing, discard into plastic bag and place into trash. Cleanse the wound with  Chad [name], Chad [name] ([id number]) [id number] Page 1 of 17 Visit Report for 05/08/2023 Chief Complaint Document Details Patient Name: Date of Service: Chad [name], Chad [name] 05/08/2023 10:45 A M Medical Record Number: [id number] Patient Account Number: [id number] Date of Birth/Sex: Treating RN: 24-Mar-1961 (62 y.o. [name] Primary Care Provider: [name] Other Clinician: Referring Provider: Treating Provider/Extender: [name] Weeks in Treatment: 53 Information Obtained from: Patient Chief Complaint Left ankle ulcer, Left dorsal foot wound, right heel Electronic Signature(s) Signed: 05/08/2023 11:01:22 AM By: [name] PA-C Entered By: [name] on 05/08/2023 08:01:22 -------------------------------------------------------------------------------- HPI Details Patient Name: Date of Service: Chad [name] 05/08/2023 10:45 A M Medical Record Number: [id number] Patient Account Number: [id number] Date of Birth/Sex: Treating RN: 1960-07-30 (62 y.o. [name] Primary Care Provider: [name] Other Clinician: Referring Provider: Treating Provider/Extender: [name] in Treatment: 75 History of Present Illness HPI Description: 10/08/18 on evaluation today patient actually presents to our office for initial evaluation concerning wounds that he has of the bilateral lower extremities. 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 He also has bilateral venous stasis. He apparently has been homeless currently following discharge from the hospital apparently he has been placed at [facility] which is is a skilled nursing facility locally. Nonetheless fortunately he does not show any signs of infection at this time which is good news. In fact several of the  wound actually appears to be showing some signs of improvement already in my pinion. There are a couple areas in the left leg in particular there likely gonna require some sharp debridement to help clear away some necrotic tissue and help with more sufficient healing. No fevers, chills, nausea, or vomiting noted at this time. 10/15/18 on evaluation today patient actually appears to be doing very well in regard to his bilateral lower extremities. He's been tolerating the dressing changes without complication. Fortunately there does not appear to be any evidence of active infection at this time which is great news. Overall I'm actually very pleased with how this has progressed in just one visits time. Readmission: 08/14/2020 upon evaluation today patient presents for re-evaluation here in our clinic. He is having issues with his left ankle region as well as his right toe and his right heel. He tells me that the toe and heel actually began as a area that was itching that he was scratching and then subsequently opened up into wounds. These may have been abscess areas I presume based on what I am seeing currently. With regard to his left ankle region he tells me this was a similar type Chad [name], Chad [name] ([id number]) [id number] Page 2 of 17 occurrence although he does have venous stasis this very well may be more of a venous leg ulcer more than anything. Nonetheless I do believe that the patient would benefit from appropriate and aggressive wound care to try to help get things under better control here. He does have history of a stroke on the left side affecting him to some degree there that he is able to stand although he does have some residual weakness. Otherwise again the patient does have chronic venous insufficiency as previously noted. 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 No doubt this has a lot to do with the discomfort in the right leg he describes when he is in bed. They have been using Xeroform on the wounds on the right. He has a lot of swelling in this leg with weeping edema 04-09-2023 upon evaluation today patient appears to be doing poorly still in regard to his right leg. 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  Fortunately I do not see any signs of active infection locally or systemically at this time which is great news. No fevers, chills, nausea, vomiting, or diarrhea. With that being said I am concerned still about the fact that he seems to be getting worse especially in regard to the heels I think that he is still sitting up most of the day which I think is still causing some swelling of his leg unfortunately. 04-16-2023 upon evaluation today patient actually appears to be doing little bit more poorly compared to last week this actually does appear to be infected. Last week I really did not think that was the case but this seems like it may indeed be the case. Fortunately I do not see any signs of active infection locally or systemically at this time. 04-24-2023 upon evaluation today patient appears to be doing well currently in regard to his leg compared to where we were previous I do feel like that the antibiotics have been of benefit. Fortunately I do not see any signs of active infection at this time which is great news and in general I believe that we are making headway towards healing which is great news as well. With that being said he still has a lot of drainage I really feel like something like Anola Gurney would be better for him than just an ABD pad it would likely the drainage more or even Zetuvit's but I am not sure whether we can get either 1 of these at the facility for him. 05-01-2023 upon evaluation today patient appears to be doing well currently in regard to his wounds which are showing signs of significant improvement is not any way shape or form infected like it was last time I saw him. I think the antibiotics are doing a good job but I think he may need an extension here to ensure Chad [name], Chad [name] ([id number]) [id number] Page 6 of 17 that it completely clears. 05-08-2023 upon evaluation today patient appears to be doing well currently in regard to his wounds for  the most part. He still has blue-green drainage noted but in general seems to be doing much better compared to where we were previous. Fortunately I do not see any signs of active infection locally or systemically which is great news and in general I do believe that we are making good headway towards complete closure. Electronic Signature(s) Signed: 05/08/2023 1:56:22 PM By: [name] PA-C Entered By: [name] on 05/08/2023 10:56:22 -------------------------------------------------------------------------------- Physical Exam Details Patient Name: Date of Service: Chad [name], Chad [name] 05/08/2023 10:45 A M Medical Record Number: [id number] Patient Account Number: [id number] Date of Birth/Sex: Treating RN: 03-16-1961 (62 y.o. [name] Primary Care Provider: [name] Other Clinician: Referring Provider: Treating Provider/Extender: [name] Weeks in Treatment: 53 Constitutional Well-nourished and well-hydrated in no acute distress. Respiratory normal breathing without difficulty. Psychiatric this patient is able to make decisions and demonstrates good insight into disease process. Alert and Oriented x  D, 3x10 (in/yd) 1 x Per Day/30 Days Electronic Signature(s) Signed: 05/08/2023 1:39:07 PM By: Midge Aver MSN RN CNS WTA Signed: 05/08/2023 2:05:20 PM By: [name] PA-C Entered By: Midge Aver on 05/08/2023 10:36:43 Chad [name] ([id number]) [id number] Page 9 of 17 -------------------------------------------------------------------------------- Problem List Details Patient Name: Date of Service: Chad [name], Chad [name] 05/08/2023 10:45 A M Medical Record Number: [id number] Patient Account Number: [id number] Date of Birth/Sex: Treating RN: 19-Jun-1961 (62 y.o. 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 02/19/2023 No Yes foot 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, stage 3 10/07/2022 No Yes Inactive Problems ICD-10 Code Description Active Date Inactive Date L97.322 Non-pressure chronic ulcer of left ankle with fat layer exposed 12/06/2021 12/06/2021 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 [name] Primary Care Provider: [name] Other Clinician: Referring Provider: Treating Provider/Extender: [name] Weeks in Treatment: 63 Subjective Chief Complaint Information obtained from Patient Left ankle ulcer, Left dorsal foot wound, right heel History of Present Illness (HPI) 10/08/18 on evaluation today patient actually presents to our office for initial evaluation concerning wounds that he has of the bilateral lower extremities. 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 He also has bilateral venous stasis. He apparently has been homeless currently following discharge from the hospital apparently he has been placed at [facility] which is is a skilled nursing facility locally. Nonetheless fortunately he does not show any signs of infection at this time which is good news. In fact several of the wound actually appears to be showing some signs of improvement already in my pinion. There are a couple areas in the left leg in particular there likely gonna require some sharp debridement to help clear away some necrotic tissue and help with more sufficient healing. No fevers, chills, nausea, or vomiting noted at this time. 10/15/18 on evaluation today patient actually appears to be doing very well in regard to his bilateral lower extremities. He's been tolerating the dressing changes without complication. Fortunately there does not appear to be any evidence of active infection at this time which is great news. Overall I'm actually very pleased with how this has progressed in just one visits time. Readmission: 08/14/2020 upon evaluation today patient presents for re-evaluation here in our clinic. He is having issues with his left ankle region as well as his right toe and his right heel. He tells me that the toe and heel actually began as a area that was itching  3. pleasant and cooperative. Notes Upon inspection patient's wound bed actually showed signs of good granulation and epithelization at this point. Fortunately I do not see any signs of worsening for the most part although he still has a lot of drainage they need to make sure to get the XtraSorb exactly over the areas where this drainage is I think is doing much better control and this compared to just ABD pads I am very pleased in that regard. Electronic Signature(s) Signed: 05/08/2023 1:56:51 PM By: [name] PA-C Entered By: [name] on 05/08/2023 10:56:50 -------------------------------------------------------------------------------- Physician Orders Details Patient Name: Date of Service: Chad [name] 05/08/2023 10:45 A M Medical Record Number:  [id number] Patient Account Number: [id number] Date of Birth/Sex: Treating RN: 1961/03/02 (62 y.o. 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 Nurse Visit as needed Bathing/ Shower/ Hygiene Clean wound with Normal Saline or wound cleanser. No tub bath. Anesthetic (Use 'Patient Medications' Section for Anesthetic Order Entry) Lidocaine applied to wound bed Edema Control - Lymphedema / Segmental Compressive Device / Other Tubigrip single layer applied. - Size D Elevate legs to the level of the heart and pump ankles as often as possible Elevate leg(s) parallel to the floor when sitting. Other: - PATIENT MUST SLEEP IN A BED. Legs are swelling due to no elevation of legs. 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 ntibiotics - RX renewed 05/01/2023 P.O. A Wound Treatment Wound #16 - Calcaneus Wound Laterality: Right Cleanser: Soap and Water 1 x Per Day/30 Days Discharge Instructions: Gently cleanse wound with antibacterial soap, rinse and pat dry prior to dressing wounds Cleanser: Wound Cleanser 1 x Per Day/30 Days Discharge Instructions: Wash your hands  with soap and water. Remove old dressing, discard into plastic bag and place into trash. Cleanse the wound with Wound Cleanser prior to applying a clean dressing using gauze sponges, not tissues or cotton balls. Do not scrub or use excessive force. Pat dry using gauze sponges, not tissue or cotton balls. Prim Dressing: Silvercel Small 2x2 (in/in) 1 x Per Day/30 Days ary Discharge Instructions: Apply Silvercel Small 2x2 (in/in) as instructed Secondary Dressing: Zetuvit Plus 4x8 (in/in) 1 x Per Day/30 Days Secondary Dressing: Heel Cup 1 x Per Day/30 Days Secured With: Kerlix Roll Sterile or Non-Sterile 6-ply 4.5x4 (yd/yd) 1 x Per Day/30 Days Discharge Instructions: Apply Kerlix as directed Secured With: Tubigrip Size D, 3x10 (in/yd) 1 x Per Day/30 Days Wound #18 - T Second oe Wound Laterality: Right, Medial Cleanser: Soap and Water 1 x Per Day/30 Days Discharge Instructions: Gently cleanse wound with antibacterial soap, rinse and pat dry prior to dressing wounds Cleanser: Wound Cleanser 1 x Per Day/30 Days Discharge Instructions: Wash your hands with soap and water. Remove old dressing, discard into plastic bag and place into trash. Cleanse the wound with  contact our office for additional recommendations. Electronic Signature(s) Signed: 05/08/2023 1:57:16 PM By: [name] PA-C Entered By: [name] on 05/08/2023  10:57:16 -------------------------------------------------------------------------------- SuperBill Details Patient Name: Date of Service: Chad [name], Chad [name] 05/08/2023 Medical Record Number: [id number] Patient Account Number: [id number] Date of Birth/Sex: Treating RN: [birth year] (62 y.o. 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 with other specified severity Chad [name], Chad [name] ([id number]) [id number] Page 17 of 17 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 foot 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, stage 3 Facility Procedures : CPT4 Code: [id number] Description: 84132 - WOUND CARE VISIT-LEV 5 EST PT Modifier: Quantity: 1 Physician Procedures : CPT4 Code Description Modifier [id number] - WC PHYS LEVEL 3 - EST PT ICD-10 Diagnosis Description 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 (idiopathic) with ulcer and inflammation of left lower extremity L98.492 Non-pressure chronic ulcer of skin of  other sites with fat layer exposed 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 Quantity: 1 Electronic Signature(s) Signed: 05/08/2023 1:58:53 PM By: [name] PA-C Previous Signature: 05/08/2023 1:36:55 PM Version By: Midge Aver MSN RN CNS WTA Entered By: [name] on 05/08/2023 10:58:53  Chad [name], Chad [name] ([id number]) [id number] Page 1 of 17 Visit Report for 05/08/2023 Chief Complaint Document Details Patient Name: Date of Service: Chad [name], Chad [name] 05/08/2023 10:45 A M Medical Record Number: [id number] Patient Account Number: [id number] Date of Birth/Sex: Treating RN: 24-Mar-1961 (62 y.o. [name] Primary Care Provider: [name] Other Clinician: Referring Provider: Treating Provider/Extender: [name] Weeks in Treatment: 53 Information Obtained from: Patient Chief Complaint Left ankle ulcer, Left dorsal foot wound, right heel Electronic Signature(s) Signed: 05/08/2023 11:01:22 AM By: [name] PA-C Entered By: [name] on 05/08/2023 08:01:22 -------------------------------------------------------------------------------- HPI Details Patient Name: Date of Service: Chad [name] 05/08/2023 10:45 A M Medical Record Number: [id number] Patient Account Number: [id number] Date of Birth/Sex: Treating RN: 1960-07-30 (62 y.o. [name] Primary Care Provider: [name] Other Clinician: Referring Provider: Treating Provider/Extender: [name] in Treatment: 75 History of Present Illness HPI Description: 10/08/18 on evaluation today patient actually presents to our office for initial evaluation concerning wounds that he has of the bilateral lower extremities. 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 He also has bilateral venous stasis. He apparently has been homeless currently following discharge from the hospital apparently he has been placed at [facility] which is is a skilled nursing facility locally. Nonetheless fortunately he does not show any signs of infection at this time which is good news. In fact several of the  wound actually appears to be showing some signs of improvement already in my pinion. There are a couple areas in the left leg in particular there likely gonna require some sharp debridement to help clear away some necrotic tissue and help with more sufficient healing. No fevers, chills, nausea, or vomiting noted at this time. 10/15/18 on evaluation today patient actually appears to be doing very well in regard to his bilateral lower extremities. He's been tolerating the dressing changes without complication. Fortunately there does not appear to be any evidence of active infection at this time which is great news. Overall I'm actually very pleased with how this has progressed in just one visits time. Readmission: 08/14/2020 upon evaluation today patient presents for re-evaluation here in our clinic. He is having issues with his left ankle region as well as his right toe and his right heel. He tells me that the toe and heel actually began as a area that was itching that he was scratching and then subsequently opened up into wounds. These may have been abscess areas I presume based on what I am seeing currently. With regard to his left ankle region he tells me this was a similar type Chad [name], Chad [name] ([id number]) [id number] Page 2 of 17 occurrence although he does have venous stasis this very well may be more of a venous leg ulcer more than anything. Nonetheless I do believe that the patient would benefit from appropriate and aggressive wound care to try to help get things under better control here. He does have history of a stroke on the left side affecting him to some degree there that he is able to stand although he does have some residual weakness. Otherwise again the patient does have chronic venous insufficiency as previously noted. His arterial studies most recently obtained showed that he had an ABI on the right of 1.16 with a TBI of 0.52 and on the left and ABI of 1.14  contact our office for additional recommendations. Electronic Signature(s) Signed: 05/08/2023 1:57:16 PM By: [name] PA-C Entered By: [name] on 05/08/2023  10:57:16 -------------------------------------------------------------------------------- SuperBill Details Patient Name: Date of Service: Chad [name], Chad [name] 05/08/2023 Medical Record Number: [id number] Patient Account Number: [id number] Date of Birth/Sex: Treating RN: [birth year] (62 y.o. 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 with other specified severity Chad [name], Chad [name] ([id number]) [id number] Page 17 of 17 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 foot 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, stage 3 Facility Procedures : CPT4 Code: [id number] Description: 84132 - WOUND CARE VISIT-LEV 5 EST PT Modifier: Quantity: 1 Physician Procedures : CPT4 Code Description Modifier [id number] - WC PHYS LEVEL 3 - EST PT ICD-10 Diagnosis Description 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 (idiopathic) with ulcer and inflammation of left lower extremity L98.492 Non-pressure chronic ulcer of skin of  other sites with fat layer exposed 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 Quantity: 1 Electronic Signature(s) Signed: 05/08/2023 1:58:53 PM By: [name] PA-C Previous Signature: 05/08/2023 1:36:55 PM Version By: Midge Aver MSN RN CNS WTA Entered By: [name] on 05/08/2023 10:58:53  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 No doubt this has a lot to do with the discomfort in the right leg he describes when he is in bed. They have been using Xeroform on the wounds on the right. He has a lot of swelling in this leg with weeping edema 04-09-2023 upon evaluation today patient appears to be doing poorly still in regard to his right leg. 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  Fortunately I do not see any signs of active infection locally or systemically at this time which is great news. No fevers, chills, nausea, vomiting, or diarrhea. With that being said I am concerned still about the fact that he seems to be getting worse especially in regard to the heels I think that he is still sitting up most of the day which I think is still causing some swelling of his leg unfortunately. 04-16-2023 upon evaluation today patient actually appears to be doing little bit more poorly compared to last week this actually does appear to be infected. Last week I really did not think that was the case but this seems like it may indeed be the case. Fortunately I do not see any signs of active infection locally or systemically at this time. 04-24-2023 upon evaluation today patient appears to be doing well currently in regard to his leg compared to where we were previous I do feel like that the antibiotics have been of benefit. Fortunately I do not see any signs of active infection at this time which is great news and in general I believe that we are making headway towards healing which is great news as well. With that being said he still has a lot of drainage I really feel like something like Anola Gurney would be better for him than just an ABD pad it would likely the drainage more or even Zetuvit's but I am not sure whether we can get either 1 of these at the facility for him. 05-01-2023 upon evaluation today patient appears to be doing well currently in regard to his wounds which are showing signs of significant improvement is not any way shape or form infected like it was last time I saw him. I think the antibiotics are doing a good job but I think he may need an extension here to ensure Chad [name], Chad [name] ([id number]) [id number] Page 6 of 17 that it completely clears. 05-08-2023 upon evaluation today patient appears to be doing well currently in regard to his wounds for  the most part. He still has blue-green drainage noted but in general seems to be doing much better compared to where we were previous. Fortunately I do not see any signs of active infection locally or systemically which is great news and in general I do believe that we are making good headway towards complete closure. Electronic Signature(s) Signed: 05/08/2023 1:56:22 PM By: [name] PA-C Entered By: [name] on 05/08/2023 10:56:22 -------------------------------------------------------------------------------- Physical Exam Details Patient Name: Date of Service: Chad [name], Chad [name] 05/08/2023 10:45 A M Medical Record Number: [id number] Patient Account Number: [id number] Date of Birth/Sex: Treating RN: 03-16-1961 (62 y.o. [name] Primary Care Provider: [name] Other Clinician: Referring Provider: Treating Provider/Extender: [name] Weeks in Treatment: 53 Constitutional Well-nourished and well-hydrated in no acute distress. Respiratory normal breathing without difficulty. Psychiatric this patient is able to make decisions and demonstrates good insight into disease process. Alert and Oriented x  3. pleasant and cooperative. Notes Upon inspection patient's wound bed actually showed signs of good granulation and epithelization at this point. Fortunately I do not see any signs of worsening for the most part although he still has a lot of drainage they need to make sure to get the XtraSorb exactly over the areas where this drainage is I think is doing much better control and this compared to just ABD pads I am very pleased in that regard. Electronic Signature(s) Signed: 05/08/2023 1:56:51 PM By: [name] PA-C Entered By: [name] on 05/08/2023 10:56:50 -------------------------------------------------------------------------------- Physician Orders Details Patient Name: Date of Service: Chad [name] 05/08/2023 10:45 A M Medical Record Number:  [id number] Patient Account Number: [id number] Date of Birth/Sex: Treating RN: 1961/03/02 (62 y.o. 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 Nurse Visit as needed Bathing/ Shower/ Hygiene Clean wound with Normal Saline or wound cleanser. No tub bath. Anesthetic (Use 'Patient Medications' Section for Anesthetic Order Entry) Lidocaine applied to wound bed Edema Control - Lymphedema / Segmental Compressive Device / Other Tubigrip single layer applied. - Size D Elevate legs to the level of the heart and pump ankles as often as possible Elevate leg(s) parallel to the floor when sitting. Other: - PATIENT MUST SLEEP IN A BED. Legs are swelling due to no elevation of legs. 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 ntibiotics - RX renewed 05/01/2023 P.O. A Wound Treatment Wound #16 - Calcaneus Wound Laterality: Right Cleanser: Soap and Water 1 x Per Day/30 Days Discharge Instructions: Gently cleanse wound with antibacterial soap, rinse and pat dry prior to dressing wounds Cleanser: Wound Cleanser 1 x Per Day/30 Days Discharge Instructions: Wash your hands  with soap and water. Remove old dressing, discard into plastic bag and place into trash. Cleanse the wound with Wound Cleanser prior to applying a clean dressing using gauze sponges, not tissues or cotton balls. Do not scrub or use excessive force. Pat dry using gauze sponges, not tissue or cotton balls. Prim Dressing: Silvercel Small 2x2 (in/in) 1 x Per Day/30 Days ary Discharge Instructions: Apply Silvercel Small 2x2 (in/in) as instructed Secondary Dressing: Zetuvit Plus 4x8 (in/in) 1 x Per Day/30 Days Secondary Dressing: Heel Cup 1 x Per Day/30 Days Secured With: Kerlix Roll Sterile or Non-Sterile 6-ply 4.5x4 (yd/yd) 1 x Per Day/30 Days Discharge Instructions: Apply Kerlix as directed Secured With: Tubigrip Size D, 3x10 (in/yd) 1 x Per Day/30 Days Wound #18 - T Second oe Wound Laterality: Right, Medial Cleanser: Soap and Water 1 x Per Day/30 Days Discharge Instructions: Gently cleanse wound with antibacterial soap, rinse and pat dry prior to dressing wounds Cleanser: Wound Cleanser 1 x Per Day/30 Days Discharge Instructions: Wash your hands with soap and water. Remove old dressing, discard into plastic bag and place into trash. Cleanse the wound with  Chad [name], Chad [name] ([id number]) [id number] Page 1 of 17 Visit Report for 05/08/2023 Chief Complaint Document Details Patient Name: Date of Service: Chad [name], Chad [name] 05/08/2023 10:45 A M Medical Record Number: [id number] Patient Account Number: [id number] Date of Birth/Sex: Treating RN: 24-Mar-1961 (62 y.o. [name] Primary Care Provider: [name] Other Clinician: Referring Provider: Treating Provider/Extender: [name] Weeks in Treatment: 53 Information Obtained from: Patient Chief Complaint Left ankle ulcer, Left dorsal foot wound, right heel Electronic Signature(s) Signed: 05/08/2023 11:01:22 AM By: [name] PA-C Entered By: [name] on 05/08/2023 08:01:22 -------------------------------------------------------------------------------- HPI Details Patient Name: Date of Service: Chad [name] 05/08/2023 10:45 A M Medical Record Number: [id number] Patient Account Number: [id number] Date of Birth/Sex: Treating RN: 1960-07-30 (62 y.o. [name] Primary Care Provider: [name] Other Clinician: Referring Provider: Treating Provider/Extender: [name] in Treatment: 75 History of Present Illness HPI Description: 10/08/18 on evaluation today patient actually presents to our office for initial evaluation concerning wounds that he has of the bilateral lower extremities. 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 He also has bilateral venous stasis. He apparently has been homeless currently following discharge from the hospital apparently he has been placed at [facility] which is is a skilled nursing facility locally. Nonetheless fortunately he does not show any signs of infection at this time which is good news. In fact several of the  wound actually appears to be showing some signs of improvement already in my pinion. There are a couple areas in the left leg in particular there likely gonna require some sharp debridement to help clear away some necrotic tissue and help with more sufficient healing. No fevers, chills, nausea, or vomiting noted at this time. 10/15/18 on evaluation today patient actually appears to be doing very well in regard to his bilateral lower extremities. He's been tolerating the dressing changes without complication. Fortunately there does not appear to be any evidence of active infection at this time which is great news. Overall I'm actually very pleased with how this has progressed in just one visits time. Readmission: 08/14/2020 upon evaluation today patient presents for re-evaluation here in our clinic. He is having issues with his left ankle region as well as his right toe and his right heel. He tells me that the toe and heel actually began as a area that was itching that he was scratching and then subsequently opened up into wounds. These may have been abscess areas I presume based on what I am seeing currently. With regard to his left ankle region he tells me this was a similar type Chad [name], Chad [name] ([id number]) [id number] Page 2 of 17 occurrence although he does have venous stasis this very well may be more of a venous leg ulcer more than anything. Nonetheless I do believe that the patient would benefit from appropriate and aggressive wound care to try to help get things under better control here. He does have history of a stroke on the left side affecting him to some degree there that he is able to stand although he does have some residual weakness. Otherwise again the patient does have chronic venous insufficiency as previously noted. His arterial studies most recently obtained showed that he had an ABI on the right of 1.16 with a TBI of 0.52 and on the left and ABI of 1.14  D, 3x10 (in/yd) 1 x Per Day/30 Days Electronic Signature(s) Signed: 05/08/2023 1:39:07 PM By: Midge Aver MSN RN CNS WTA Signed: 05/08/2023 2:05:20 PM By: [name] PA-C Entered By: Midge Aver on 05/08/2023 10:36:43 Chad [name] ([id number]) [id number] Page 9 of 17 -------------------------------------------------------------------------------- Problem List Details Patient Name: Date of Service: Chad [name], Chad [name] 05/08/2023 10:45 A M Medical Record Number: [id number] Patient Account Number: [id number] Date of Birth/Sex: Treating RN: 19-Jun-1961 (62 y.o. 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 02/19/2023 No Yes foot 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, stage 3 10/07/2022 No Yes Inactive Problems ICD-10 Code Description Active Date Inactive Date L97.322 Non-pressure chronic ulcer of left ankle with fat layer exposed 12/06/2021 12/06/2021 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 [name] Primary Care Provider: [name] Other Clinician: Referring Provider: Treating Provider/Extender: [name] Weeks in Treatment: 63 Subjective Chief Complaint Information obtained from Patient Left ankle ulcer, Left dorsal foot wound, right heel History of Present Illness (HPI) 10/08/18 on evaluation today patient actually presents to our office for initial evaluation concerning wounds that he has of the bilateral lower extremities. 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 He also has bilateral venous stasis. He apparently has been homeless currently following discharge from the hospital apparently he has been placed at [facility] which is is a skilled nursing facility locally. Nonetheless fortunately he does not show any signs of infection at this time which is good news. In fact several of the wound actually appears to be showing some signs of improvement already in my pinion. There are a couple areas in the left leg in particular there likely gonna require some sharp debridement to help clear away some necrotic tissue and help with more sufficient healing. No fevers, chills, nausea, or vomiting noted at this time. 10/15/18 on evaluation today patient actually appears to be doing very well in regard to his bilateral lower extremities. He's been tolerating the dressing changes without complication. Fortunately there does not appear to be any evidence of active infection at this time which is great news. Overall I'm actually very pleased with how this has progressed in just one visits time. Readmission: 08/14/2020 upon evaluation today patient presents for re-evaluation here in our clinic. He is having issues with his left ankle region as well as his right toe and his right heel. He tells me that the toe and heel actually began as a area that was itching  3. pleasant and cooperative. Notes Upon inspection patient's wound bed actually showed signs of good granulation and epithelization at this point. Fortunately I do not see any signs of worsening for the most part although he still has a lot of drainage they need to make sure to get the XtraSorb exactly over the areas where this drainage is I think is doing much better control and this compared to just ABD pads I am very pleased in that regard. Electronic Signature(s) Signed: 05/08/2023 1:56:51 PM By: [name] PA-C Entered By: [name] on 05/08/2023 10:56:50 -------------------------------------------------------------------------------- Physician Orders Details Patient Name: Date of Service: Chad [name] 05/08/2023 10:45 A M Medical Record Number:  [id number] Patient Account Number: [id number] Date of Birth/Sex: Treating RN: 1961/03/02 (62 y.o. 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 Nurse Visit as needed Bathing/ Shower/ Hygiene Clean wound with Normal Saline or wound cleanser. No tub bath. Anesthetic (Use 'Patient Medications' Section for Anesthetic Order Entry) Lidocaine applied to wound bed Edema Control - Lymphedema / Segmental Compressive Device / Other Tubigrip single layer applied. - Size D Elevate legs to the level of the heart and pump ankles as often as possible Elevate leg(s) parallel to the floor when sitting. Other: - PATIENT MUST SLEEP IN A BED. Legs are swelling due to no elevation of legs. 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 ntibiotics - RX renewed 05/01/2023 P.O. A Wound Treatment Wound #16 - Calcaneus Wound Laterality: Right Cleanser: Soap and Water 1 x Per Day/30 Days Discharge Instructions: Gently cleanse wound with antibacterial soap, rinse and pat dry prior to dressing wounds Cleanser: Wound Cleanser 1 x Per Day/30 Days Discharge Instructions: Wash your hands  with soap and water. Remove old dressing, discard into plastic bag and place into trash. Cleanse the wound with Wound Cleanser prior to applying a clean dressing using gauze sponges, not tissues or cotton balls. Do not scrub or use excessive force. Pat dry using gauze sponges, not tissue or cotton balls. Prim Dressing: Silvercel Small 2x2 (in/in) 1 x Per Day/30 Days ary Discharge Instructions: Apply Silvercel Small 2x2 (in/in) as instructed Secondary Dressing: Zetuvit Plus 4x8 (in/in) 1 x Per Day/30 Days Secondary Dressing: Heel Cup 1 x Per Day/30 Days Secured With: Kerlix Roll Sterile or Non-Sterile 6-ply 4.5x4 (yd/yd) 1 x Per Day/30 Days Discharge Instructions: Apply Kerlix as directed Secured With: Tubigrip Size D, 3x10 (in/yd) 1 x Per Day/30 Days Wound #18 - T Second oe Wound Laterality: Right, Medial Cleanser: Soap and Water 1 x Per Day/30 Days Discharge Instructions: Gently cleanse wound with antibacterial soap, rinse and pat dry prior to dressing wounds Cleanser: Wound Cleanser 1 x Per Day/30 Days Discharge Instructions: Wash your hands with soap and water. Remove old dressing, discard into plastic bag and place into trash. Cleanse the wound with  D, 3x10 (in/yd) 1 x Per Day/30 Days Electronic Signature(s) Signed: 05/08/2023 1:39:07 PM By: Midge Aver MSN RN CNS WTA Signed: 05/08/2023 2:05:20 PM By: [name] PA-C Entered By: Midge Aver on 05/08/2023 10:36:43 Chad [name] ([id number]) [id number] Page 9 of 17 -------------------------------------------------------------------------------- Problem List Details Patient Name: Date of Service: Chad [name], Chad [name] 05/08/2023 10:45 A M Medical Record Number: [id number] Patient Account Number: [id number] Date of Birth/Sex: Treating RN: 19-Jun-1961 (62 y.o. 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 02/19/2023 No Yes foot 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, stage 3 10/07/2022 No Yes Inactive Problems ICD-10 Code Description Active Date Inactive Date L97.322 Non-pressure chronic ulcer of left ankle with fat layer exposed 12/06/2021 12/06/2021 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 [name] Primary Care Provider: [name] Other Clinician: Referring Provider: Treating Provider/Extender: [name] Weeks in Treatment: 63 Subjective Chief Complaint Information obtained from Patient Left ankle ulcer, Left dorsal foot wound, right heel History of Present Illness (HPI) 10/08/18 on evaluation today patient actually presents to our office for initial evaluation concerning wounds that he has of the bilateral lower extremities. 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 He also has bilateral venous stasis. He apparently has been homeless currently following discharge from the hospital apparently he has been placed at [facility] which is is a skilled nursing facility locally. Nonetheless fortunately he does not show any signs of infection at this time which is good news. In fact several of the wound actually appears to be showing some signs of improvement already in my pinion. There are a couple areas in the left leg in particular there likely gonna require some sharp debridement to help clear away some necrotic tissue and help with more sufficient healing. No fevers, chills, nausea, or vomiting noted at this time. 10/15/18 on evaluation today patient actually appears to be doing very well in regard to his bilateral lower extremities. He's been tolerating the dressing changes without complication. Fortunately there does not appear to be any evidence of active infection at this time which is great news. Overall I'm actually very pleased with how this has progressed in just one visits time. Readmission: 08/14/2020 upon evaluation today patient presents for re-evaluation here in our clinic. He is having issues with his left ankle region as well as his right toe and his right heel. 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 No doubt this has a lot to do with the discomfort in the right leg he describes when he is in bed. They have been using Xeroform on the wounds on the right. He has a lot of swelling in this leg with weeping edema 04-09-2023 upon evaluation today patient appears to be doing poorly still in regard to his right leg. 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  Fortunately I do not see any signs of active infection locally or systemically at this time which is great news. No fevers, chills, nausea, vomiting, or diarrhea. With that being said I am concerned still about the fact that he seems to be getting worse especially in regard to the heels I think that he is still sitting up most of the day which I think is still causing some swelling of his leg unfortunately. 04-16-2023 upon evaluation today patient actually appears to be doing little bit more poorly compared to last week this actually does appear to be infected. Last week I really did not think that was the case but this seems like it may indeed be the case. Fortunately I do not see any signs of active infection locally or systemically at this time. 04-24-2023 upon evaluation today patient appears to be doing well currently in regard to his leg compared to where we were previous I do feel like that the antibiotics have been of benefit. Fortunately I do not see any signs of active infection at this time which is great news and in general I believe that we are making headway towards healing which is great news as well. With that being said he still has a lot of drainage I really feel like something like Anola Gurney would be better for him than just an ABD pad it would likely the drainage more or even Zetuvit's but I am not sure whether we can get either 1 of these at the facility for him. 05-01-2023 upon evaluation today patient appears to be doing well currently in regard to his wounds which are showing signs of significant improvement is not any way shape or form infected like it was last time I saw him. I think the antibiotics are doing a good job but I think he may need an extension here to ensure Chad [name], Chad [name] ([id number]) [id number] Page 6 of 17 that it completely clears. 05-08-2023 upon evaluation today patient appears to be doing well currently in regard to his wounds for  the most part. He still has blue-green drainage noted but in general seems to be doing much better compared to where we were previous. Fortunately I do not see any signs of active infection locally or systemically which is great news and in general I do believe that we are making good headway towards complete closure. Electronic Signature(s) Signed: 05/08/2023 1:56:22 PM By: [name] PA-C Entered By: [name] on 05/08/2023 10:56:22 -------------------------------------------------------------------------------- Physical Exam Details Patient Name: Date of Service: Chad [name], Chad [name] 05/08/2023 10:45 A M Medical Record Number: [id number] Patient Account Number: [id number] Date of Birth/Sex: Treating RN: 03-16-1961 (62 y.o. [name] Primary Care Provider: [name] Other Clinician: Referring Provider: Treating Provider/Extender: [name] Weeks in Treatment: 53 Constitutional Well-nourished and well-hydrated in no acute distress. Respiratory normal breathing without difficulty. Psychiatric this patient is able to make decisions and demonstrates good insight into disease process. Alert and Oriented x  3. pleasant and cooperative. Notes Upon inspection patient's wound bed actually showed signs of good granulation and epithelization at this point. Fortunately I do not see any signs of worsening for the most part although he still has a lot of drainage they need to make sure to get the XtraSorb exactly over the areas where this drainage is I think is doing much better control and this compared to just ABD pads I am very pleased in that regard. Electronic Signature(s) Signed: 05/08/2023 1:56:51 PM By: [name] PA-C Entered By: [name] on 05/08/2023 10:56:50 -------------------------------------------------------------------------------- Physician Orders Details Patient Name: Date of Service: Chad [name] 05/08/2023 10:45 A M Medical Record Number:  [id number] Patient Account Number: [id number] Date of Birth/Sex: Treating RN: 1961/03/02 (62 y.o. 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 Nurse Visit as needed Bathing/ Shower/ Hygiene Clean wound with Normal Saline or wound cleanser. No tub bath. Anesthetic (Use 'Patient Medications' Section for Anesthetic Order Entry) Lidocaine applied to wound bed Edema Control - Lymphedema / Segmental Compressive Device / Other Tubigrip single layer applied. - Size D Elevate legs to the level of the heart and pump ankles as often as possible Elevate leg(s) parallel to the floor when sitting. Other: - PATIENT MUST SLEEP IN A BED. Legs are swelling due to no elevation of legs. 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 ntibiotics - RX renewed 05/01/2023 P.O. A Wound Treatment Wound #16 - Calcaneus Wound Laterality: Right Cleanser: Soap and Water 1 x Per Day/30 Days Discharge Instructions: Gently cleanse wound with antibacterial soap, rinse and pat dry prior to dressing wounds Cleanser: Wound Cleanser 1 x Per Day/30 Days Discharge Instructions: Wash your hands  with soap and water. Remove old dressing, discard into plastic bag and place into trash. Cleanse the wound with Wound Cleanser prior to applying a clean dressing using gauze sponges, not tissues or cotton balls. Do not scrub or use excessive force. Pat dry using gauze sponges, not tissue or cotton balls. Prim Dressing: Silvercel Small 2x2 (in/in) 1 x Per Day/30 Days ary Discharge Instructions: Apply Silvercel Small 2x2 (in/in) as instructed Secondary Dressing: Zetuvit Plus 4x8 (in/in) 1 x Per Day/30 Days Secondary Dressing: Heel Cup 1 x Per Day/30 Days Secured With: Kerlix Roll Sterile or Non-Sterile 6-ply 4.5x4 (yd/yd) 1 x Per Day/30 Days Discharge Instructions: Apply Kerlix as directed Secured With: Tubigrip Size D, 3x10 (in/yd) 1 x Per Day/30 Days Wound #18 - T Second oe Wound Laterality: Right, Medial Cleanser: Soap and Water 1 x Per Day/30 Days Discharge Instructions: Gently cleanse wound with antibacterial soap, rinse and pat dry prior to dressing wounds Cleanser: Wound Cleanser 1 x Per Day/30 Days Discharge Instructions: Wash your hands with soap and water. Remove old dressing, discard into plastic bag and place into trash. Cleanse the wound with

## 2023-05-13 ENCOUNTER — Ambulatory Visit (INDEPENDENT_AMBULATORY_CARE_PROVIDER_SITE_OTHER): Payer: Medicaid Other | Admitting: Nurse Practitioner

## 2023-05-13 ENCOUNTER — Encounter (INDEPENDENT_AMBULATORY_CARE_PROVIDER_SITE_OTHER): Payer: Self-pay | Admitting: Nurse Practitioner

## 2023-05-13 VITALS — BP 129/74 | HR 87 | Resp 16

## 2023-05-13 DIAGNOSIS — Z89512 Acquired absence of left leg below knee: Secondary | ICD-10-CM

## 2023-05-14 ENCOUNTER — Encounter (INDEPENDENT_AMBULATORY_CARE_PROVIDER_SITE_OTHER): Payer: Self-pay | Admitting: Nurse Practitioner

## 2023-05-14 NOTE — Progress Notes (Signed)
Subjective:    Patient ID: Chad [name], male    DOB: Oct 27, 1960, 62 y.o.   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  However the patient refused to have ABIs done today.  On examination of his amputation site all staples have been removed and the wound is healing fairly well.  There is just 1 area of opening along the incision.  The area had a small amount of fibrinous exudate and was treated with Medihoney.  Today it still is very small with a could be she wound bed.  There is no evidence of infection today.  The patient can bend his knee backwards but is unable to straighten his knee fully at this time unfortunately.  However he notes that he is also unable to completely straighten the knee of his right lower extremity either.  He does not complain of any significant pain.    Review of Systems  Skin:  Positive for wound.  Neurological:  Positive for weakness.  All other systems reviewed and are negative.      Objective:   Physical Exam Vitals reviewed.  HENT:     Head: Normocephalic.  Cardiovascular:     Rate and Rhythm: Normal rate.  Pulmonary:     Effort: Pulmonary effort is normal.  Musculoskeletal:     Left Lower Extremity: Left leg is amputated below knee.  Skin:    General: Skin is warm and dry.  Neurological:     Mental Status: He is alert and oriented to person, place, and time.     Motor: Weakness present.  Psychiatric:        Mood and Affect: Mood normal.        Behavior: Behavior normal.        Thought Content: Thought content normal.        Judgment: Judgment normal.     BP 129/74 (BP Location: Right Arm)   Pulse 87   Resp 16   Past Medical History:  Diagnosis Date   Anemia    Anxiety    ARF (acute respiratory failure) (HCC)    Atherosclerosis of arteries of extremities (HCC)     Benign prostatic hyperplasia with urinary obstruction    Bladder cancer (HCC)    BPH with obstruction/lower urinary tract symptoms    Chronic viral hepatitis C (HCC)    Colon cancer (HCC)    COPD (chronic obstructive pulmonary disease) (HCC)    Depression    Dysphagia    Family history of colon cancer    Family history of kidney cancer    Family history of leukemia    Family history of prostate cancer    Generalized anxiety disorder    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    GERD (gastroesophageal reflux disease)    Hepatitis    chronic hep c   Hydronephrosis    Hydronephrosis with ureteral stricture    Hyperlipidemia    Insomnia, unspecified    Knee pain    Left   Major depressive disorder, recurrent, moderate (HCC)    Malignant neoplasm of colon (HCC)    Malnutrition (HCC)    Muscle weakness (generalized)    Nerve pain    Other abnormalities of gait and mobility    Other chronic pain    Peripheral vascular disease (HCC)  Personal history of transient cerebral ischemia    Pressure ulcer    Prostate cancer (HCC)    Stroke (HCC)    Tinea unguium    Tobacco user    Unspecified protein-calorie malnutrition (HCC)    Ureteral cancer, right (HCC)    Urinary frequency    Venous hypertension of both lower extremities    Xerosis cutis     Social History   Socioeconomic History   Marital status: Single    Spouse name: Not on file   Number of children: Not on file   Years of education: Not on file   Highest education level: Not on file  Occupational History   Not on file  Tobacco Use   Smoking status: Every Day    Current packs/day: 0.50    Types: Cigarettes   Smokeless tobacco: Never  Vaping Use   Vaping status: Never Used  Substance and Sexual Activity   Alcohol use: Not Currently   Drug use: Not Currently    Types: Marijuana, Methylphenidate    Comment: quit 1997-1998 ish   Sexual activity: Not Currently  Other Topics Concern   Not on file   Social History Narrative    used to live [place]; moved  To [place]- end of April 2019; in Nursing home; 1pp/day; quit alcohol. 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        Family- dad- prostate ca [at 78y]; brother- 14 died of prostate cancer; brother- 3- no cancers [[place]]; sonThayer Ohm [[place]];[name]-32y prostate ca [facility] [place]]; daughter- 15 [[place]]; another daughter 20 [[place]/addict]. will refer genetics counseling. Given MSI- abnormal; highly suspicious of Lynch syndrome.  Patient's son Cristal Deer aware of high possible lynch syndrome.   Social Determinants of Health   Financial Resource Strain: Not on file  Food Insecurity: No Food Insecurity (02/26/2023)   Hunger Vital Sign    Worried About Running Out of Food in the Last Year: Never true    Ran Out of Food in the Last Year: Never true  Transportation Needs: No Transportation Needs (02/26/2023)   PRAPARE - Administrator, Civil Service (Medical): No    Lack of Transportation (Non-Medical): No  Physical Activity: Not on file  Stress: Not on file  Social Connections: Not on file  Intimate Partner Violence: Not At Risk (02/26/2023)   Humiliation, Afraid, Rape, and Kick questionnaire    Fear of Current or Ex-Partner: No    Emotionally Abused: No    Physically Abused: No    Sexually Abused: No    Past Surgical History:  Procedure Laterality Date   AMPUTATION Left 03/03/2023   Procedure: AMPUTATION BELOW KNEE;  Surgeon: Renford Dills, MD;  Location: [facility];  Service: Vascular;  Laterality: Left;   COLON SURGERY     En bloc extended right hemicolectomy 07/2017   COLONOSCOPY WITH PROPOFOL N/A 11/06/2020   Procedure: COLONOSCOPY WITH PROPOFOL;  Surgeon: Wyline Mood, MD;  Location[facility];  Service: Gastroenterology;  Laterality: N/A;   COLONOSCOPY WITH PROPOFOL N/A 07/31/2021   Procedure: COLONOSCOPY WITH PROPOFOL;  Surgeon: Earline Mayotte, MD;  Location: [facility] ENDOSCOPY;  Service: Endoscopy;  Laterality: N/A;   CYSTOSCOPY W/  RETROGRADES Right 08/30/2018   Procedure: CYSTOSCOPY WITH RETROGRADE PYELOGRAM;  Surgeon: Vanna Scotland, MD;  Location: [facility];  Service: Urology;  Laterality: Right;   CYSTOSCOPY WITH STENT PLACEMENT Right 04/25/2018   Procedure: CYSTOSCOPY WITH STENT PLACEMENT;  Surgeon: Vanna Scotland, MD;  Location: [facility];  Service: Urology;  Laterality: Right;   CYSTOSCOPY WITH STENT PLACEMENT Right 08/30/2018   Procedure: CYSTOSCOPY WITH STENT Exchange;  Surgeon: Vanna Scotland, MD;  Location: [facility];  Service: Urology;  Laterality: Right;   CYSTOSCOPY WITH STENT PLACEMENT Right 03/07/2019   Procedure: CYSTOSCOPY WITH STENT Exchange;  Surgeon: Vanna Scotland, MD;  Location: [facility];  Service: Urology;  Laterality: Right;   CYSTOSCOPY WITH STENT PLACEMENT Right 11/21/2019   Procedure: CYSTOSCOPY WITH STENT Exchange;  Surgeon: Vanna Scotland, MD;  Location: [facility];  Service: Urology;  Laterality: Right;   LOWER EXTREMITY ANGIOGRAPHY Left 05/23/2019   Procedure: LOWER EXTREMITY ANGIOGRAPHY;  Surgeon: Annice Needy, MD;  Location: [facility];  Service: Cardiovascular;  Laterality: Left;   LOWER EXTREMITY ANGIOGRAPHY Right 05/30/2019   Procedure: LOWER EXTREMITY ANGIOGRAPHY;  Surgeon: Annice Needy, MD;  Location: [facility];  Service: Cardiovascular;  Laterality: Right;   LOWER EXTREMITY ANGIOGRAPHY Right 02/13/2020   Procedure: LOWER EXTREMITY ANGIOGRAPHY;  Surgeon: Annice Needy, MD;  Location: [facility];  Service: Cardiovascular;  Laterality: Right;   LOWER EXTREMITY ANGIOGRAPHY Left 02/20/2020   Procedure: LOWER EXTREMITY ANGIOGRAPHY;  Surgeon: Annice Needy, MD;  Location: [facility];  Service: Cardiovascular;  Laterality: Left;   LOWER EXTREMITY ANGIOGRAPHY Left 01/01/2023   Procedure: Lower Extremity Angiography;  Surgeon: Annice Needy, MD;  Location: [facility];  Service: Cardiovascular;  Laterality: Left;   PORTA CATH INSERTION N/A 02/28/2019    Procedure: PORTA CATH INSERTION;  Surgeon: Annice Needy, MD;  Location: [facility];  Service: Cardiovascular;  Laterality: N/A;   ROBOT ASSISTED LAPAROSCOPIC PARTIAL COLECTOMY  11/17/2022   tumor removed       Family History  Problem Relation Age of Onset   Prostate cancer Father 81   Cancer Brother 62       unsure type   Cancer Paternal Uncle        unsure type   Cancer Maternal Grandmother        unsure type   Cancer Paternal Grandmother        unsure type   Kidney cancer Paternal Grandfather    Cancer Other        unsure types   Leukemia Son    Cancer Son        other cancers, possibly colon    Allergies  Allergen Reactions   Penicillins Rash       Latest Ref Rng & Units 03/31/2023    9:15 AM 03/06/2023    5:28 AM 03/04/2023    5:08 AM  CBC  WBC 4.0 - 10.5 K/uL 8.0  6.4  6.0   Hemoglobin 13.0 - 17.0 g/dL 9.9  8.3  9.2   Hematocrit 39.0 - 52.0 % 31.8  25.3  27.5   Platelets 150 - 400 K/uL 218  141  144       CMP     Component Value Date/Time   NA 136 03/31/2023 0915   K 4.0 03/31/2023 0915   CL 110 03/31/2023 0915   CO2 21 (L) 03/31/2023 0915   GLUCOSE 103 (H) 03/31/2023 0915   BUN 22 03/31/2023 0915   CREATININE 0.84 03/31/2023 0915   CALCIUM 8.3 (L) 03/31/2023 0915   PROT 6.5 03/31/2023 0915   ALBUMIN 2.8 (L) 03/31/2023 0915   AST 30 03/31/2023 0915   ALT 21 03/31/2023 0915   ALKPHOS 88 03/31/2023 0915   BILITOT 0.3 03/31/2023 0915  GFRNONAA >60 03/31/2023 0915     No results found.     Assessment & Plan:   1. Hx of BKA, left (HCC) Today the patient's left below-knee amputation site is healing fairly well.  There is no evidence of infection and just a very small approximately 1 cm area of opening.  Patient will begin to utilize Aquacel and change daily.  In regards to his contracture, this would not cause the patient's significant issues given that he has likely had this for years even prior to his amputation.  I did discuss with  patient that this contracture would likely make walking ambulating with the use of a prosthetic limb difficulty with mind possible.  However at this time given the patient's wound on his right lower extremity he likely will not be doing extensive ambulation at this time anyway.  Will plan on having her return in about 4 weeks for wound reevaluation  Current Outpatient Medications on File Prior to Visit  Medication Sig Dispense Refill   acetaminophen (TYLENOL) 325 MG tablet Take 650 mg by mouth 3 (three) times daily.      Amino Acids-Protein Hydrolys (PRO-STAT) LIQD Take 45 mLs by mouth daily.     ascorbic acid (VITAMIN C) 500 MG tablet Take 1 tablet (500 mg total) by mouth 2 (two) times daily.     aspirin EC 81 MG tablet Take 1 tablet (81 mg total) by mouth daily. 150 tablet 2   atorvastatin (LIPITOR) 10 MG tablet Take 1 tablet (10 mg total) by mouth daily. 30 tablet 11   Calcium Carb-Cholecalciferol (CALCIUM CARBONATE-VITAMIN D3 PO) Take 2 tablets by mouth daily. 600-400 mg     clopidogrel (PLAVIX) 75 MG tablet Take 75 mg by mouth daily.     cyclobenzaprine (FEXMID) 7.5 MG tablet Take 15 mg by mouth at bedtime.     escitalopram (LEXAPRO) 5 MG tablet Take 5 mg by mouth daily.     ferrous fumarate (FERRETTS) 325 (106 Fe) MG TABS tablet Take 1 tablet by mouth.     gabapentin (NEURONTIN) 100 MG capsule Take 100 mg by mouth 3 (three) times daily.     ibuprofen (ADVIL) 400 MG tablet Take 1 tablet (400 mg total) by mouth every 6 (six) hours as needed for mild pain or moderate pain.     leptospermum manuka honey (MEDIHONEY) PSTE paste Apply 1 Application topically daily.     mirtazapine (REMERON) 7.5 MG tablet Take 7.5 mg by mouth at bedtime.     Multiple Vitamin (MULTIVITAMIN) tablet Take 1 tablet by mouth daily.     oxyCODONE 10 MG TABS Take 1-1.5 tablets (10-15 mg total) by mouth every 4 (four) hours as needed for severe pain or moderate pain. 30 tablet 0   Polyethyl Glycol-Propyl Glycol (SYSTANE)  0.4-0.3 % SOLN Apply to eye.     potassium chloride SA (KLOR-CON M) 20 MEQ tablet Take 2 tablets (40 mEq total) by mouth daily.     senna-docusate (SENOKOT-S) 8.6-50 MG tablet Take 1 tablet by mouth daily.     tamsulosin (FLOMAX) 0.4 MG CAPS capsule Take 1 capsule (0.4 mg total) by mouth daily after supper. 30 capsule 3   traMADol (ULTRAM) 50 MG tablet Take 1 tablet (50 mg total) by mouth every 6 (six) hours as needed. 20 tablet 0   Current Facility-Administered Medications on File Prior to Visit  Medication Dose Route Frequency Provider Last Rate Last Admin   heparin lock flush 100 UNIT/ML injection  There are no Patient Instructions on file for this visit. No follow-ups on file.   Georgiana Spinner, NP

## 2023-05-15 ENCOUNTER — Inpatient Hospital Stay: Payer: Medicaid Other

## 2023-05-15 ENCOUNTER — Inpatient Hospital Stay: Payer: Medicaid Other | Attending: Internal Medicine

## 2023-05-15 ENCOUNTER — Encounter: Payer: Self-pay | Admitting: Internal Medicine

## 2023-05-15 ENCOUNTER — Inpatient Hospital Stay (HOSPITAL_BASED_OUTPATIENT_CLINIC_OR_DEPARTMENT_OTHER): Payer: Medicaid Other | Attending: Internal Medicine | Admitting: Internal Medicine

## 2023-05-15 VITALS — BP 115/85 | HR 80 | Temp 96.8°F | Resp 17 | Wt 145.0 lb

## 2023-05-15 DIAGNOSIS — F17210 Nicotine dependence, cigarettes, uncomplicated: Secondary | ICD-10-CM | POA: Insufficient documentation

## 2023-05-15 DIAGNOSIS — Z23 Encounter for immunization: Secondary | ICD-10-CM | POA: Diagnosis not present

## 2023-05-15 DIAGNOSIS — C661 Malignant neoplasm of right ureter: Secondary | ICD-10-CM | POA: Diagnosis not present

## 2023-05-15 DIAGNOSIS — Z8051 Family history of malignant neoplasm of kidney: Secondary | ICD-10-CM | POA: Insufficient documentation

## 2023-05-15 DIAGNOSIS — Z8042 Family history of malignant neoplasm of prostate: Secondary | ICD-10-CM | POA: Diagnosis not present

## 2023-05-15 DIAGNOSIS — Z806 Family history of leukemia: Secondary | ICD-10-CM | POA: Insufficient documentation

## 2023-05-15 DIAGNOSIS — C187 Malignant neoplasm of sigmoid colon: Secondary | ICD-10-CM | POA: Insufficient documentation

## 2023-05-15 DIAGNOSIS — I739 Peripheral vascular disease, unspecified: Secondary | ICD-10-CM | POA: Insufficient documentation

## 2023-05-15 DIAGNOSIS — D509 Iron deficiency anemia, unspecified: Secondary | ICD-10-CM | POA: Diagnosis present

## 2023-05-15 DIAGNOSIS — D500 Iron deficiency anemia secondary to blood loss (chronic): Secondary | ICD-10-CM

## 2023-05-15 LAB — CBC WITH DIFFERENTIAL (CANCER CENTER ONLY)
Abs Immature Granulocytes: 0.01 10*3/uL (ref 0.00–0.07)
Basophils Absolute: 0 10*3/uL (ref 0.0–0.1)
Basophils Relative: 1 %
Eosinophils Absolute: 0.4 10*3/uL (ref 0.0–0.5)
Eosinophils Relative: 7 %
HCT: 30.5 % — ABNORMAL LOW (ref 39.0–52.0)
Hemoglobin: 9.5 g/dL — ABNORMAL LOW (ref 13.0–17.0)
Immature Granulocytes: 0 %
Lymphocytes Relative: 19 %
Lymphs Abs: 1.2 10*3/uL (ref 0.7–4.0)
MCH: 26.2 pg (ref 26.0–34.0)
MCHC: 31.1 g/dL (ref 30.0–36.0)
MCV: 84.3 fL (ref 80.0–100.0)
Monocytes Absolute: 0.5 10*3/uL (ref 0.1–1.0)
Monocytes Relative: 8 %
Neutro Abs: 4.1 10*3/uL (ref 1.7–7.7)
Neutrophils Relative %: 65 %
Platelet Count: 195 10*3/uL (ref 150–400)
RBC: 3.62 MIL/uL — ABNORMAL LOW (ref 4.22–5.81)
RDW: 14.4 % (ref 11.5–15.5)
WBC Count: 6.3 10*3/uL (ref 4.0–10.5)
nRBC: 0 % (ref 0.0–0.2)

## 2023-05-15 LAB — IRON AND TIBC
Iron: 110 ug/dL (ref 45–182)
Saturation Ratios: 32 % (ref 17.9–39.5)
TIBC: 342 ug/dL (ref 250–450)
UIBC: 232 ug/dL

## 2023-05-15 LAB — CMP (CANCER CENTER ONLY)
ALT: 13 U/L (ref 0–44)
AST: 25 U/L (ref 15–41)
Albumin: 2.9 g/dL — ABNORMAL LOW (ref 3.5–5.0)
Alkaline Phosphatase: 104 U/L (ref 38–126)
Anion gap: 5 (ref 5–15)
BUN: 20 mg/dL (ref 8–23)
CO2: 23 mmol/L (ref 22–32)
Calcium: 8.2 mg/dL — ABNORMAL LOW (ref 8.9–10.3)
Chloride: 109 mmol/L (ref 98–111)
Creatinine: 0.75 mg/dL (ref 0.61–1.24)
GFR, Estimated: >60 mL/min (ref >60)
Glucose, Bld: 104 mg/dL — ABNORMAL HIGH (ref 70–99)
Potassium: 3.8 mmol/L (ref 3.5–5.1)
Sodium: 137 mmol/L (ref 135–145)
Total Bilirubin: 0.4 mg/dL (ref 0.3–1.2)
Total Protein: 7 g/dL (ref 6.5–8.1)

## 2023-05-15 LAB — FERRITIN: Ferritin: 37 ng/mL (ref 24–336)

## 2023-05-15 MED ORDER — INFLUENZA VIRUS VACC SPLIT PF (FLUZONE) 0.5 ML IM SUSY
0.5000 mL | PREFILLED_SYRINGE | Freq: Once | INTRAMUSCULAR | Status: AC
  Administered 2023-05-15: 0.5 mL via INTRAMUSCULAR
  Filled 2023-05-15: qty 0.5

## 2023-05-15 MED ORDER — SODIUM CHLORIDE 0.9% FLUSH
10.0000 mL | Freq: Once | INTRAVENOUS | Status: AC
  Administered 2023-05-15: 10 mL via INTRAVENOUS
  Filled 2023-05-15: qty 10

## 2023-05-15 MED ORDER — SODIUM CHLORIDE 0.9 % IV SOLN
200.0000 mg | Freq: Once | INTRAVENOUS | Status: AC
  Administered 2023-05-15: 200 mg via INTRAVENOUS
  Filled 2023-05-15: qty 200

## 2023-05-15 MED ORDER — SODIUM CHLORIDE 0.9 % IV SOLN
Freq: Once | INTRAVENOUS | Status: AC
  Filled 2023-05-15: qty 250

## 2023-05-15 MED ORDER — HEPARIN SOD (PORK) LOCK FLUSH 100 UNIT/ML IV SOLN
500.0000 [IU] | Freq: Once | INTRAVENOUS | Status: AC
  Administered 2023-05-15: 500 [IU] via INTRAVENOUS
  Filled 2023-05-15: qty 5

## 2023-05-15 NOTE — Progress Notes (Signed)
Appetite is good. Denies any abd pain . Occ Right foot pain; has multiple ulcers on that foot per pt. Wearing a large padded boot. Energy is good/ normal. Colostomy draining well. Incision healing on L BKA. No paperwork sent from Facility today.

## 2023-05-15 NOTE — Progress Notes (Signed)
[name] CONSULT NOTE  [name] Care Team: Everardo Beals, MD as PCP - General (Family Medicine) Earna Coder, MD as Consulting Physician (Internal Medicine) Lemar Livings Merrily Pew, MD as Consulting Physician (General Surgery)  CHIEF COMPLAINTS/PURPOSE OF CONSULTATION: Urothelial cancer  #  Oncology History Overview Note  # SEP-OCT 2019-right renal pelvis/ ureteral [cytology positive HIGH grade urothelial carcinoma [Dr.Brandon]    # NOV 24th 2019-Keytruda [consent]  # April 2022- colonoscopy [Dr.Anna;incidental PET- sigmoid uptake] # Sigmoid colon cancer- [history of Lynch syndrome]- [Dr.Cintron; APRIL 2024]- s/p hemicolectomy- permanent ostomy; MUCINOUS ADENOCARCINOMA, 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  - ELEVEN LYMPH NODES, NEGATIVE FOR MALIGNANCY (0/11).  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  Is alone/in a wheelchair.  Chad [name] 62 y.o.  male with Lynch syndrome and above 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; recurrent sigmoid colon cancer -stage II and multiple other  comorbidities including PVD/active smoker currently on Keytruda is here for follow-up. Chad [name] is currently on HOLD given acute issues/PVD etc.   Chad [name] states his appetite is good. Denies any abd pain . Occ Right foot pain; has multiple ulcers on that foot per pt. Wearing a large padded boot. Energy is good/ normal. Colostomy draining well. Incision healing on L BKA. 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 Chronic pain in legs. Working with PT.    Denies any nausea. No diarrhea. Feels some fatigue.  Denies any worsening abdominal pain.  No blood in stools or black-colored stools.  No hematuria.  Chronic shortness of breath.  Not any worse.   Review of Systems  Constitutional:  Positive for malaise/fatigue. Negative for chills, diaphoresis, fever and weight loss.  HENT:  Negative for nosebleeds and sore throat.   Eyes:  Negative for double vision.  Respiratory:  Positive for cough and shortness of breath. Negative for hemoptysis and wheezing.   Cardiovascular:  Negative for chest pain, palpitations and orthopnea.  Gastrointestinal:  Positive for constipation. Negative for abdominal pain, blood in stool, diarrhea, heartburn, melena, nausea and vomiting.  Genitourinary:  Negative for dysuria, frequency and urgency.  Musculoskeletal:  Positive for back pain and joint pain.  Skin: Negative.  Negative for itching and rash.  Neurological:  Positive for focal weakness. Negative for dizziness, tingling, weakness and headaches.       Chronic left-sided weakness upper than lower extremity.  Endo/Heme/Allergies:  Does not bruise/bleed easily.  Psychiatric/Behavioral:  Negative for depression. The Chad [name] is not nervous/anxious and does not have insomnia.      MEDICAL  HISTORY:  Past Medical History:  Diagnosis Date   Anemia    Anxiety    ARF (acute respiratory failure) (HCC)    Atherosclerosis of arteries of extremities (HCC)     Benign prostatic hyperplasia with urinary obstruction    Bladder cancer (HCC)    BPH with obstruction/lower urinary tract symptoms    Chronic viral hepatitis C (HCC)    Colon cancer (HCC)    COPD (chronic obstructive pulmonary disease) (HCC)    Depression    Dysphagia    Family history of colon cancer    Family history of kidney cancer    Family history of leukemia    Family history of prostate cancer    Generalized anxiety disorder    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    GERD (gastroesophageal reflux disease)    Hepatitis    chronic hep c   Hydronephrosis    Hydronephrosis with ureteral stricture    Hyperlipidemia    Insomnia, unspecified    Knee pain    Left   Major depressive disorder, recurrent, moderate (HCC)    Malignant neoplasm of colon (HCC)    Malnutrition (HCC)    Muscle weakness (generalized)    Nerve pain    Other abnormalities of gait and mobility    Other chronic pain    Peripheral vascular disease (HCC)    Personal history of transient cerebral ischemia    Pressure ulcer    Prostate cancer (HCC)    Stroke (HCC)    Tinea unguium    Tobacco user    Unspecified protein-calorie malnutrition (HCC)    Ureteral cancer, right (HCC)    Urinary frequency    Venous hypertension of both lower extremities    Xerosis cutis     SURGICAL HISTORY: Past Surgical History:  Procedure Laterality Date   AMPUTATION Left 03/03/2023   Procedure: AMPUTATION BELOW KNEE;  Surgeon: Renford Dills, MD;  Location: [facility];  Service: Vascular;  Laterality: Left;   COLON SURGERY     En bloc extended right hemicolectomy 07/2017   COLONOSCOPY WITH PROPOFOL N/A 11/06/2020   Procedure: COLONOSCOPY WITH PROPOFOL;  Surgeon: Wyline Mood, MD;  Location: [facility];  Service: Gastroenterology;  Laterality: N/A;   COLONOSCOPY WITH PROPOFOL N/A 07/31/2021   Procedure: COLONOSCOPY WITH PROPOFOL;  Surgeon: Earline Mayotte, MD;  Location: [facility] ENDOSCOPY;  Service:  Endoscopy;  Laterality: N/A;   CYSTOSCOPY W/ RETROGRADES Right 08/30/2018   Procedure: CYSTOSCOPY WITH RETROGRADE PYELOGRAM;  Surgeon: Vanna Scotland, MD;  Location: [facility];  Service: Urology;  Laterality: Right;   CYSTOSCOPY WITH STENT PLACEMENT Right 04/25/2018   Procedure: CYSTOSCOPY WITH STENT PLACEMENT;  Surgeon: Vanna Scotland, MD;  Location: [facility];  Service: Urology;  Laterality: Right;   CYSTOSCOPY WITH STENT PLACEMENT Right 08/30/2018   Procedure: CYSTOSCOPY WITH STENT Exchange;  Surgeon: Vanna Scotland, MD;  Location: [facility];  Service: Urology;  Laterality: Right;   CYSTOSCOPY WITH STENT PLACEMENT Right 03/07/2019   Procedure: CYSTOSCOPY WITH STENT Exchange;  Surgeon: Vanna Scotland, MD;  Location: [facility];  Service: Urology;  Laterality: Right;   CYSTOSCOPY WITH STENT PLACEMENT Right 11/21/2019   Procedure: CYSTOSCOPY WITH STENT Exchange;  Surgeon: Vanna Scotland, MD;  Location: [facility];  Service: Urology;  Laterality: Right;   LOWER EXTREMITY ANGIOGRAPHY Left 05/23/2019   Procedure: LOWER EXTREMITY ANGIOGRAPHY;  Surgeon: Annice Needy, MD;  Location: [facility];  Service: Cardiovascular;  Laterality: Left;   LOWER EXTREMITY ANGIOGRAPHY Right 05/30/2019  Procedure: LOWER EXTREMITY ANGIOGRAPHY;  Surgeon: Annice Needy, MD;  Location: [facility];  Service: Cardiovascular;  Laterality: Right;   LOWER EXTREMITY ANGIOGRAPHY Right 02/13/2020   Procedure: LOWER EXTREMITY ANGIOGRAPHY;  Surgeon: Annice Needy, MD;  Location: [facility];  Service: Cardiovascular;  Laterality: Right;   LOWER EXTREMITY ANGIOGRAPHY Left 02/20/2020   Procedure: LOWER EXTREMITY ANGIOGRAPHY;  Surgeon: Annice Needy, MD;  Location: [facility];  Service: Cardiovascular;  Laterality: Left;   LOWER EXTREMITY ANGIOGRAPHY Left 01/01/2023   Procedure: Lower Extremity Angiography;  Surgeon: Annice Needy, MD;  Location: [facility];  Service: Cardiovascular;   Laterality: Left;   PORTA CATH INSERTION N/A 02/28/2019   Procedure: PORTA CATH INSERTION;  Surgeon: Annice Needy, MD;  Location: [facility];  Service: Cardiovascular;  Laterality: N/A;   ROBOT ASSISTED LAPAROSCOPIC PARTIAL COLECTOMY  11/17/2022   tumor removed       SOCIAL HISTORY: Social History   Socioeconomic History   Marital status: Single    Spouse name: Not on file   Number of children: Not on file   Years of education: Not on file   Highest education level: Not on file  Occupational History   Not on file  Tobacco Use   Smoking status: Every Day    Current packs/day: 0.50    Types: Cigarettes   Smokeless tobacco: Never  Vaping Use   Vaping status: Never Used  Substance and Sexual Activity   Alcohol use: Not Currently   Drug use: Not Currently    Types: Marijuana, Methylphenidate    Comment: quit 1997-1998 ish   Sexual activity: Not Currently  Other Topics Concern   Not on file  Social History Narrative    used to live [place]; moved  To [place]- end of April 2019; in Nursing home; 1pp/day; quit alcohol. 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        Family- dad- prostate ca [at 78y]; brother- 44 died of prostate cancer; brother- 59- no cancers [[place]]; sonThayer Ohm [[place]];[name]-32y prostate ca [name] [place]]; daughter- 51 [[place]]; another daughter 38 [[place]/addict]. will refer genetics counseling. Given MSI- abnormal; highly suspicious of Lynch syndrome.  Chad [name]'s son Cristal Deer aware of high possible lynch syndrome.   Social Determinants of Health   Financial Resource Strain: Not on file  Food Insecurity: No Food Insecurity (02/26/2023)   Hunger Vital Sign    Worried About Running Out of Food in the Last Year: Never true    Ran Out of Food in the Last Year: Never true  Transportation Needs: No Transportation Needs (02/26/2023)   PRAPARE - Administrator, Civil Service (Medical): No    Lack of Transportation (Non-Medical): No  Physical Activity: Not on file   Stress: Not on file  Social Connections: Not on file  Intimate Partner Violence: Not At Risk (02/26/2023)   Humiliation, Afraid, Rape, and Kick questionnaire    Fear of Current or Ex-Partner: No    Emotionally Abused: No    Physically Abused: No    Sexually Abused: No    FAMILY HISTORY: Family History  Problem Relation Age of Onset   Prostate cancer Father 18   Cancer Brother 75       unsure type   Cancer Paternal Uncle        unsure type   Cancer Maternal Grandmother        unsure type   Cancer Paternal Grandmother  unsure type   Kidney cancer Paternal Grandfather    Cancer Other        unsure types   Leukemia Son    Cancer Son        other cancers, possibly colon    ALLERGIES:  is allergic to penicillins.  MEDICATIONS:  Current Outpatient Medications  Medication Sig Dispense Refill   acetaminophen (TYLENOL) 325 MG tablet Take 650 mg by mouth 3 (three) times daily.      Amino Acids-Protein Hydrolys (PRO-STAT) LIQD Take 45 mLs by mouth daily.     ascorbic acid (VITAMIN C) 500 MG tablet Take 1 tablet (500 mg total) by mouth 2 (two) times daily.     aspirin EC 81 MG tablet Take 1 tablet (81 mg total) by mouth daily. 150 tablet 2   atorvastatin (LIPITOR) 10 MG tablet Take 1 tablet (10 mg total) by mouth daily. 30 tablet 11   Calcium Carb-Cholecalciferol (CALCIUM CARBONATE-VITAMIN D3 PO) Take 2 tablets by mouth daily. 600-400 mg     clopidogrel (PLAVIX) 75 MG tablet Take 75 mg by mouth daily.     cyclobenzaprine (FEXMID) 7.5 MG tablet Take 15 mg by mouth at bedtime.     escitalopram (LEXAPRO) 5 MG tablet Take 5 mg by mouth daily.     ferrous fumarate (FERRETTS) 325 (106 Fe) MG TABS tablet Take 1 tablet by mouth.     gabapentin (NEURONTIN) 100 MG capsule Take 100 mg by mouth 3 (three) times daily.     ibuprofen (ADVIL) 400 MG tablet Take 1 tablet (400 mg total) by mouth every 6 (six) hours as needed for mild pain or moderate pain.     leptospermum manuka honey  (MEDIHONEY) PSTE paste Apply 1 Application topically daily.     mirtazapine (REMERON) 7.5 MG tablet Take 7.5 mg by mouth at bedtime.     Multiple Vitamin (MULTIVITAMIN) tablet Take 1 tablet by mouth daily.     oxyCODONE 10 MG TABS Take 1-1.5 tablets (10-15 mg total) by mouth every 4 (four) hours as needed for severe pain or moderate pain. 30 tablet 0   Polyethyl Glycol-Propyl Glycol (SYSTANE) 0.4-0.3 % SOLN Apply to eye.     potassium chloride SA (KLOR-CON M) 20 MEQ tablet Take 2 tablets (40 mEq total) by mouth daily.     senna-docusate (SENOKOT-S) 8.6-50 MG tablet Take 1 tablet by mouth daily.     tamsulosin (FLOMAX) 0.4 MG CAPS capsule Take 1 capsule (0.4 mg total) by mouth daily after supper. 30 capsule 3   traMADol (ULTRAM) 50 MG tablet Take 1 tablet (50 mg total) by mouth every 6 (six) hours as needed. 20 tablet 0   No current facility-administered medications for this visit.   Facility-Administered Medications Ordered in Other Visits  Medication Dose Route Frequency Provider Last Rate Last Admin   heparin lock flush 100 UNIT/ML injection               .  PHYSICAL EXAMINATION: ECOG PERFORMANCE STATUS: 1 - Symptomatic but completely ambulatory  Vitals:   05/15/23 1020  BP: 115/85  Pulse: 80  Resp: 17  Temp: (!) 96.8 F (36 C)  SpO2: 99%     Filed Weights   05/15/23 1020  Weight: 145 lb (65.8 kg)   Left lower extremity- s/p BKA.   # R LE- swelling/ in wrap.   Physical Exam HENT:     Head: Normocephalic and atraumatic.     Mouth/Throat:     Pharynx: No oropharyngeal  exudate.  Eyes:     Pupils: Pupils are equal, round, and reactive to light.  Cardiovascular:     Rate and Rhythm: Normal rate and regular rhythm.  Pulmonary:     Effort: No respiratory distress.     Breath sounds: No wheezing.  Abdominal:     General: Bowel sounds are normal. There is no distension.     Palpations: Abdomen is soft. There is no mass.     Tenderness: There is no abdominal  tenderness. There is no guarding or rebound.  Musculoskeletal:        General: No tenderness. Normal range of motion.     Cervical back: Normal range of motion and neck supple.  Skin:    General: Skin is warm.     Comments: Bilateral lower extremity ulcerations noted.  Pulses intact bilaterally.  Cystlike lesion noted right elbow-nontender.  No erythema noted.  Neurological:     Mental Status: He is oriented to person, place, and time.     Comments: Sleepy but easily arousable.  Chronic weakness left upper and lower extremity.  Psychiatric:        Mood and Affect: Affect normal.      LABORATORY DATA:  I have reviewed the data as listed Lab Results  Component Value Date   WBC 6.3 05/15/2023   HGB 9.5 (L) 05/15/2023   HCT 30.5 (L) 05/15/2023   MCV 84.3 05/15/2023   PLT 195 05/15/2023   Recent Labs    03/01/23 0433 03/02/23 0450 03/06/23 0528 03/31/23 0915 05/15/23 1004  NA 138   < > 140 136 137  K 3.6   < > 3.4* 4.0 3.8  CL 114*   < > 113* 110 109  CO2 19*   < > 21* 21* 23  GLUCOSE 89   < > 106* 103* 104*  BUN 15   < > 10 22 20   CREATININE 0.85   < > 0.69 0.84 0.75  CALCIUM 7.9*   < > 8.1* 8.3* 8.2*  GFRNONAA >60   < > >60 >60 >60  PROT 6.7  --   --  6.5 7.0  ALBUMIN 2.6*  --   --  2.8* 2.9*  AST 39  --   --  30 25  ALT 18  --   --  21 13  ALKPHOS 67  --   --  88 104  BILITOT 0.6  --   --  0.3 0.4   < > = values in this interval not displayed.    ASSESSMENT & PLAN:   Ureteral cancer, right (HCC) # High-grade urothelial cancer/cytology; likely of the right renal pelvis /upper ureter. On keytruda- OCT 2024- CT CAP-  Interval resection of the sigmoid colon with resection of the adjacent adenopathy. There is some nodularity along loops of small bowel in the left lower quadrant which could reflect residual tumor. The masslike irregularity along the posterior urinary bladder is substantially reduced and the large bladder calculus is no longer identified although there  may be a small right eccentric bladder calculus. Borderline right hydronephrosis and hydroureter with suspected wall thickening in the proximal right ureter. Overall the caliber of the right proximal ureter is substantially reduced from prior, probably reflecting reduction in tumor.  # CONTINUE TO HOLD keytruda for now [given acute issues].  Labs today reviewed; Stable TSH APRIL 2024--WNL-Will order CT CAP in 3 months. Ordered today.   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 - ELEVEN LYMPH NODES, NEGATIVE FOR MALIGNANCY (0/11). - SURGICAL MARGINS FREE OF DYSPLASIA AND MALIGNANCY. S/p staple removal.-- STABLE.   # Iron deficiency anemia-hemoglobin 11-12; FEB - iron studies-10%; ferritin-27 -hemoglobin 12.7- s/p  Venofer-recheck iron studies-  stable.   # Hypocalcemia- Mild- on Ca+vit D; G+FEB 2024- 42. Stable  # active smoker:  Counseled against smoking; Chad [name]-not interested in quitting.   # IV access: port access  #Incidental findings on Imaging SEP 2024. CT -Increased right renal atrophy. Poor excretion from the right kidney on delayed images;  Upper normal sized pelvic and axillary lymph nodes, with mildly enlarged right inguinal lymph nodes up to 1.6 cm in diameter.  Tree-in-bud reticulonodular opacities in the right middle lobe compatible with atypical infectious bronchiolitis;  Aortic atherosclerosis; Aortic Atherosclerosis -  reviewed/discussed/counseled the Chad [name].    #DISPOSITION: # venofer;  # follow up in 3  months- MD- port/labs/port flush--cbc/cmp;iron studies; ferritin; Vit D 25-OH;  possible venofer; NO chemo- prior-CT CAP  prior- -Dr.B.   # I reviewed the blood work- with the Chad [name] in detail; also reviewed the imaging independently [as summarized above]; and with the Chad [name] in detail.    All questions were answered. The Chad [name] knows to call the clinic with any problems, questions or concerns.    Earna Coder, MD 05/15/2023 3:44 PM

## 2023-05-15 NOTE — Progress Notes (Signed)
Pt has been educated and understands. Pt declined to stay 30 mins after iron infusion. Influenza vaccine given today. AVS printed with influenza vaccine information.

## 2023-05-15 NOTE — Assessment & Plan Note (Addendum)
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 On keytruda- OCT 2024- CT CAP-  Interval resection of the sigmoid colon with resection of the adjacent adenopathy. There is some nodularity along loops of small bowel in the left lower quadrant which could reflect residual tumor. The masslike irregularity along the posterior urinary bladder is substantially reduced and the large bladder calculus is no longer identified although there may be a small right eccentric bladder calculus. Borderline right hydronephrosis and hydroureter with suspected wall thickening in the proximal right ureter. Overall the caliber of the right proximal ureter is substantially reduced from prior, probably reflecting reduction in tumor.  # CONTINUE TO HOLD keytruda for now [given acute issues].  Labs today reviewed; Stable TSH APRIL 2024--WNL-Will order CT CAP in 3 months. Ordered today.   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 - ELEVEN LYMPH NODES, NEGATIVE FOR MALIGNANCY (0/11). - SURGICAL MARGINS FREE OF DYSPLASIA AND MALIGNANCY. S/p staple removal.-- STABLE.   # Iron deficiency anemia-hemoglobin 11-12; FEB - iron studies-10%; ferritin-27 -hemoglobin 12.7- s/p  Venofer-recheck iron studies-  stable.   # Hypocalcemia- Mild- on Ca+vit D; G+FEB 2024- 42. Stable  # active smoker:  Counseled against smoking; patient-not interested in quitting.   # IV access: port access  #Incidental findings on Imaging SEP 2024. CT -Increased right renal atrophy. Poor excretion from the right kidney on delayed images;  Upper normal sized pelvic and  axillary lymph nodes, with mildly enlarged right inguinal lymph nodes up to 1.6 cm in diameter.  Tree-in-bud reticulonodular opacities in the right middle lobe compatible with atypical infectious bronchiolitis;  Aortic atherosclerosis; Aortic Atherosclerosis -  reviewed/discussed/counseled the patient.    #DISPOSITION: # venofer;  # follow up in 3  months- MD- port/labs/port flush--cbc/cmp;iron studies; ferritin; Vit D 25-OH;  possible venofer; NO chemo- prior-CT CAP prior- -Dr.B.   # I reviewed the blood work- with the patient in detail; also reviewed the imaging independently [as summarized above]; and with the patient in detail.

## 2023-05-18 ENCOUNTER — Encounter: Payer: Medicaid Other | Admitting: Physician Assistant

## 2023-05-18 DIAGNOSIS — I87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 hypertension (idiopathic) with ulcer and inflammation of left lower extremity: Secondary | ICD-10-CM | POA: Diagnosis not present

## 2023-05-18 NOTE — Progress Notes (Addendum)
gauze sponges, not tissue or cotton balls. Prim Dressing: Silvercel Small 2x2 (in/in) 1 x Per Day/30 Days ary Discharge Instructions: Apply Silvercel Small 2x2 (in/in) as instructed Secondary Dressing: Zetuvit Plus 4x8 (in/in) 1 x Per Day/30 Days Secondary Dressing: Heel Cup 1 x Per Day/30 Days Secured With: Kerlix Roll Sterile or Non-Sterile 6-ply 4.5x4 (yd/yd) 1 x Per Day/30 Days Discharge Instructions: Apply Kerlix as directed Secured With: Tubigrip Size D, 3x10 (in/yd) 1 x Per Day/30 Days Electronic Signature(s) Signed: 05/18/2023 4:50:43 PM By: [name] PA-C Signed: 05/18/2023 5:06:23 PM By: Midge Aver MSN RN CNS WTA Entered By: Midge Aver on 05/18/2023 13:35:15 [name] ([id number]) [id number] Page 9 of 16 -------------------------------------------------------------------------------- Problem List Details Patient Name: Date of  Service: Chad [name], Chad [name] 05/18/2023 3:30 PM Medical Record Number: [id number] Patient Account Number: [id number] Date of Birth/Sex: Treating RN: 1961-06-11 (62 y.o. 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 02/19/2023 No Yes foot 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, stage 3 10/07/2022 No Yes Inactive Problems ICD-10 Code Description Active Date Inactive Date L97.322 Non-pressure chronic ulcer of left ankle with fat layer exposed 12/06/2021 12/06/2021 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 [name] Primary Care Provider: [name] Other Clinician: Referring Provider: Treating Provider/Extender: [name] Weeks in Treatment: 86 Subjective Chief Complaint Information  obtained from Patient Left ankle ulcer, Left dorsal foot wound, right heel History of Present Illness (HPI) 10/08/18 on evaluation today patient actually presents to our office for initial evaluation concerning wounds that he has of the bilateral lower extremities. 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 He also has bilateral venous stasis. He apparently has been homeless currently following discharge from the hospital apparently he has been placed at [facility] which is is a skilled nursing facility locally. Nonetheless fortunately he does not show any signs of infection at this time which is good news. In fact several of the wound actually appears to be showing some signs of improvement already in my pinion. There are a couple areas in the left leg in particular there likely gonna require some sharp debridement to help clear away some necrotic tissue and help with more sufficient healing. No fevers, chills, nausea, or vomiting noted at this time. 10/15/18 on evaluation today patient actually appears to be doing very well in regard to his bilateral lower extremities. He's been tolerating the dressing changes without complication. Fortunately there does not appear to be any evidence of active  Chip Boer Primary Care Provider: [name] Other Clinician: Referring Provider: Treating Provider/Extender: [name] Weeks in Treatment: 49 Constitutional Well-nourished and well-hydrated in no acute distress. Respiratory normal breathing without difficulty. Psychiatric this patient is able to make decisions and demonstrates good insight into disease process. Alert and Oriented x 3. pleasant and cooperative. Notes Of note patient's dressings that he had in place today had been on since Thursday actually which is quite a bit longer than what is normal we generally have this changed every day. He is not sure exactly why they did not change them but nonetheless I think that is part of the reason why it looked as wet as it did today. In general I think though he does seem to be making some progress here. This is just very slow. Electronic  Signature(s) Signed: 05/18/2023 4:31:28 PM By: [name] PA-C Entered By: [name] on 05/18/2023 13:31:28 -------------------------------------------------------------------------------- Physician Orders Details Patient Name: Date of Service: Chad [name], Chad [name] 05/18/2023 3:30 PM Medical Record Number: [id number] Patient Account Number: [id number] Date of Birth/Sex: Treating RN: 07/04/1961 (62 y.o. 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 Nurse Visit as needed Bathing/ Shower/ Hygiene Clean wound with Normal Saline or wound cleanser. No tub bath. Anesthetic (Use 'Patient Medications' Section for Anesthetic Order Entry) Lidocaine applied to wound bed Edema Control - Lymphedema / Segmental Compressive Device / Other Tubigrip single layer applied. - Size D Elevate legs to the level of the heart and pump ankles as often as possible Elevate leg(s) parallel to the floor when sitting. Other: - PATIENT MUST SLEEP IN A BED. Legs are swelling due to no elevation of legs. 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 ntibiotics - RX renewed 05/01/2023 P.O. 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 Remove old dressing, discard into plastic bag and place into trash. Cleanse the wound with Wound Cleanser prior to applying a clean dressing using gauze sponges, not tissues or cotton balls. Do not scrub or use excessive force. Pat dry using gauze sponges, not tissue or cotton balls. Prim Dressing: Silvercel Small 2x2 (in/in) 1 x Per Day/30 Days ary Discharge Instructions: Apply Silvercel Small 2x2 (in/in) as instructed Secondary Dressing: Zetuvit Plus 4x8 (in/in) 1 x Per Day/30 Days Secondary Dressing: Heel Cup 1 x Per Day/30 Days Secured With: Kerlix Roll Sterile or Non-Sterile 6-ply 4.5x4 (yd/yd) 1 x Per Day/30 Days Discharge Instructions: Apply Kerlix as directed Secured With: Tubigrip Size D, 3x10 (  Chad [name], Chad [name] ([id number]) [id number] Page 1 of 16 Visit Report for 05/18/2023 Chief Complaint Document Details Patient Name: Date of Service: Chad [name], Chad [name] 05/18/2023 3:30 PM Medical Record Number: [id number] Patient Account Number: [id number] Date of Birth/Sex: Treating RN: 03/12/1961 (62 y.o. [name] Primary Care Provider: [name] Other Clinician: Referring Provider: Treating Provider/Extender: [name] Weeks in Treatment: 101 Information Obtained from: Patient Chief Complaint Left ankle ulcer, Left dorsal foot wound, right heel Electronic Signature(s) Signed: 05/18/2023 3:18:54 PM By: [name] PA-C Entered By: [name] on 05/18/2023 12:18:54 -------------------------------------------------------------------------------- HPI Details Patient Name: Date of Service: Chad [name] 05/18/2023 3:30 PM Medical Record Number: [id number] Patient Account Number: [id number] Date of Birth/Sex: Treating RN: 1960/08/07 (62 y.o. [name] Primary Care Provider: [name] Other Clinician: Referring Provider: Treating Provider/Extender: [name] Weeks in Treatment: 17 History of Present Illness HPI Description: 10/08/18 on evaluation today patient actually presents to our office for initial evaluation concerning wounds that he has of the bilateral lower extremities. 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 He also has bilateral venous stasis. He apparently has been homeless currently following discharge from the hospital apparently he has been placed at [facility] which is is a skilled nursing facility locally. Nonetheless fortunately he does not show any signs of infection at this time which is good news. In fact several of the wound  actually appears to be showing some signs of improvement already in my pinion. There are a couple areas in the left leg in particular there likely gonna require some sharp debridement to help clear away some necrotic tissue and help with more sufficient healing. No fevers, chills, nausea, or vomiting noted at this time. 10/15/18 on evaluation today patient actually appears to be doing very well in regard to his bilateral lower extremities. He's been tolerating the dressing changes without complication. Fortunately there does not appear to be any evidence of active infection at this time which is great news. Overall I'm actually very pleased with how this has progressed in just one visits time. Readmission: 08/14/2020 upon evaluation today patient presents for re-evaluation here in our clinic. He is having issues with his left ankle region as well as his right toe and his right heel. He tells me that the toe and heel actually began as a area that was itching that he was scratching and then subsequently opened up into wounds. These may have been abscess areas I presume based on what I am seeing currently. With regard to his left ankle region he tells me this was a similar type Chad [name], Chad [name] ([id number]) [id number] Page 2 of 16 occurrence although he does have venous stasis this very well may be more of a venous leg ulcer more than anything. Nonetheless I do believe that the patient would benefit from appropriate and aggressive wound care to try to help get things under better control here. He does have history of a stroke on the left side affecting him to some degree there that he is able to stand although he does have some residual weakness. Otherwise again the patient does have chronic venous insufficiency as previously noted. His arterial studies most recently obtained showed that he had an ABI on the right of 1.16 with a TBI of 0.52 and on the left and ABI of 1.14 with a  gauze sponges, not tissue or cotton balls. Prim Dressing: Silvercel Small 2x2 (in/in) 1 x Per Day/30 Days ary Discharge Instructions: Apply Silvercel Small 2x2 (in/in) as instructed Secondary Dressing: Zetuvit Plus 4x8 (in/in) 1 x Per Day/30 Days Secondary Dressing: Heel Cup 1 x Per Day/30 Days Secured With: Kerlix Roll Sterile or Non-Sterile 6-ply 4.5x4 (yd/yd) 1 x Per Day/30 Days Discharge Instructions: Apply Kerlix as directed Secured With: Tubigrip Size D, 3x10 (in/yd) 1 x Per Day/30 Days Electronic Signature(s) Signed: 05/18/2023 4:50:43 PM By: [name] PA-C Signed: 05/18/2023 5:06:23 PM By: Midge Aver MSN RN CNS WTA Entered By: Midge Aver on 05/18/2023 13:35:15 [name] ([id number]) [id number] Page 9 of 16 -------------------------------------------------------------------------------- Problem List Details Patient Name: Date of  Service: Chad [name], Chad [name] 05/18/2023 3:30 PM Medical Record Number: [id number] Patient Account Number: [id number] Date of Birth/Sex: Treating RN: 1961-06-11 (62 y.o. 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 02/19/2023 No Yes foot 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, stage 3 10/07/2022 No Yes Inactive Problems ICD-10 Code Description Active Date Inactive Date L97.322 Non-pressure chronic ulcer of left ankle with fat layer exposed 12/06/2021 12/06/2021 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 [name] Primary Care Provider: [name] Other Clinician: Referring Provider: Treating Provider/Extender: [name] Weeks in Treatment: 86 Subjective Chief Complaint Information  obtained from Patient Left ankle ulcer, Left dorsal foot wound, right heel History of Present Illness (HPI) 10/08/18 on evaluation today patient actually presents to our office for initial evaluation concerning wounds that he has of the bilateral lower extremities. 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 He also has bilateral venous stasis. He apparently has been homeless currently following discharge from the hospital apparently he has been placed at [facility] which is is a skilled nursing facility locally. Nonetheless fortunately he does not show any signs of infection at this time which is good news. In fact several of the wound actually appears to be showing some signs of improvement already in my pinion. There are a couple areas in the left leg in particular there likely gonna require some sharp debridement to help clear away some necrotic tissue and help with more sufficient healing. No fevers, chills, nausea, or vomiting noted at this time. 10/15/18 on evaluation today patient actually appears to be doing very well in regard to his bilateral lower extremities. He's been tolerating the dressing changes without complication. Fortunately there does not appear to be any evidence of active  gauze sponges, not tissue or cotton balls. Prim Dressing: Silvercel Small 2x2 (in/in) 1 x Per Day/30 Days ary Discharge Instructions: Apply Silvercel Small 2x2 (in/in) as instructed Secondary Dressing: Zetuvit Plus 4x8 (in/in) 1 x Per Day/30 Days Secondary Dressing: Heel Cup 1 x Per Day/30 Days Secured With: Kerlix Roll Sterile or Non-Sterile 6-ply 4.5x4 (yd/yd) 1 x Per Day/30 Days Discharge Instructions: Apply Kerlix as directed Secured With: Tubigrip Size D, 3x10 (in/yd) 1 x Per Day/30 Days Electronic Signature(s) Signed: 05/18/2023 4:50:43 PM By: [name] PA-C Signed: 05/18/2023 5:06:23 PM By: Midge Aver MSN RN CNS WTA Entered By: Midge Aver on 05/18/2023 13:35:15 [name] ([id number]) [id number] Page 9 of 16 -------------------------------------------------------------------------------- Problem List Details Patient Name: Date of  Service: Chad [name], Chad [name] 05/18/2023 3:30 PM Medical Record Number: [id number] Patient Account Number: [id number] Date of Birth/Sex: Treating RN: 1961-06-11 (62 y.o. 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 02/19/2023 No Yes foot 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, stage 3 10/07/2022 No Yes Inactive Problems ICD-10 Code Description Active Date Inactive Date L97.322 Non-pressure chronic ulcer of left ankle with fat layer exposed 12/06/2021 12/06/2021 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 [name] Primary Care Provider: [name] Other Clinician: Referring Provider: Treating Provider/Extender: [name] Weeks in Treatment: 86 Subjective Chief Complaint Information  obtained from Patient Left ankle ulcer, Left dorsal foot wound, right heel History of Present Illness (HPI) 10/08/18 on evaluation today patient actually presents to our office for initial evaluation concerning wounds that he has of the bilateral lower extremities. 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 He also has bilateral venous stasis. He apparently has been homeless currently following discharge from the hospital apparently he has been placed at [facility] which is is a skilled nursing facility locally. Nonetheless fortunately he does not show any signs of infection at this time which is good news. In fact several of the wound actually appears to be showing some signs of improvement already in my pinion. There are a couple areas in the left leg in particular there likely gonna require some sharp debridement to help clear away some necrotic tissue and help with more sufficient healing. No fevers, chills, nausea, or vomiting noted at this time. 10/15/18 on evaluation today patient actually appears to be doing very well in regard to his bilateral lower extremities. He's been tolerating the dressing changes without complication. Fortunately there does not appear to be any evidence of active  occlusion of the anterior and posterior tibial arteries without any distal [place] reconstitution. No doubt this has a lot to do with the discomfort in the right leg he describes when he is in bed. They have been using Xeroform on the wounds on the right. He has a lot of swelling in this leg with weeping edema 04-09-2023 upon evaluation today patient appears to be doing poorly still in regard to his right leg. 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  Fortunately I do not see any signs of active infection locally or systemically at this time which is great news. No fevers, chills, nausea, vomiting, or diarrhea. With that being said I am concerned still about the fact that he seems to be getting worse especially in regard to the heels I think that he is still sitting up most of the day which I think is still causing some swelling of his leg unfortunately. 04-16-2023 upon evaluation today patient actually appears to be doing little bit more poorly compared to last week this actually does appear to be infected. Last week I really did not think that was the case but this seems like it may indeed be the case. Fortunately I do not see any signs of active infection locally or systemically at this time. 04-24-2023 upon evaluation today patient appears to be doing well currently in regard to his leg compared to where we were previous I do feel like that the antibiotics have been of benefit. Fortunately I do not see any signs of active infection at this time which is great news and in general I believe that we are making headway towards healing which is great news as well. With that being said he still has a lot of drainage I really feel like something like Anola Gurney would be better for him than just an ABD pad it would likely the drainage more or even Zetuvit's but I am not sure whether we can get either 1 of these at the facility for him. 05-01-2023 upon evaluation today patient appears to be doing well currently in regard to his wounds which are showing signs of significant improvement is not any way shape or form infected like it was last time I saw him. I think the antibiotics are doing a good job but I think he may need an extension here to ensure Chad [name], Chad [name] ([id number]) [id number] Page 6 of 16 that it completely clears. 05-08-2023 upon evaluation today patient appears to be doing well currently in regard to his wounds for  the most part. He still has blue-green drainage noted but in general seems to be doing much better compared to where we were previous. Fortunately I do not see any signs of active infection locally or systemically which is great news and in general I do believe that we are making good headway towards complete closure. 05-18-2023 upon evaluation today patient appears to be doing well currently in regard to his wounds I feel like he is definitely making some improvements here. This is a slow improvement but nonetheless improvement. I do believe that he is doing quite well as far as infection is concerned which is also good news. Electronic Signature(s) Signed: 05/18/2023 4:31:05 PM By: [name] PA-C Entered By: [name] on 05/18/2023 13:31:05 -------------------------------------------------------------------------------- Physical Exam Details Patient Name: Date of Service: Chad [name], Chad [name] 05/18/2023 3:30 PM Medical Record Number: [id number] Patient Account Number: [id number] Date of Birth/Sex: Treating RN: 1960/10/17 (62 y.o. Dwan Bolt,  Chip Boer Primary Care Provider: [name] Other Clinician: Referring Provider: Treating Provider/Extender: [name] Weeks in Treatment: 49 Constitutional Well-nourished and well-hydrated in no acute distress. Respiratory normal breathing without difficulty. Psychiatric this patient is able to make decisions and demonstrates good insight into disease process. Alert and Oriented x 3. pleasant and cooperative. Notes Of note patient's dressings that he had in place today had been on since Thursday actually which is quite a bit longer than what is normal we generally have this changed every day. He is not sure exactly why they did not change them but nonetheless I think that is part of the reason why it looked as wet as it did today. In general I think though he does seem to be making some progress here. This is just very slow. Electronic  Signature(s) Signed: 05/18/2023 4:31:28 PM By: [name] PA-C Entered By: [name] on 05/18/2023 13:31:28 -------------------------------------------------------------------------------- Physician Orders Details Patient Name: Date of Service: Chad [name], Chad [name] 05/18/2023 3:30 PM Medical Record Number: [id number] Patient Account Number: [id number] Date of Birth/Sex: Treating RN: 07/04/1961 (62 y.o. 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 Nurse Visit as needed Bathing/ Shower/ Hygiene Clean wound with Normal Saline or wound cleanser. No tub bath. Anesthetic (Use 'Patient Medications' Section for Anesthetic Order Entry) Lidocaine applied to wound bed Edema Control - Lymphedema / Segmental Compressive Device / Other Tubigrip single layer applied. - Size D Elevate legs to the level of the heart and pump ankles as often as possible Elevate leg(s) parallel to the floor when sitting. Other: - PATIENT MUST SLEEP IN A BED. Legs are swelling due to no elevation of legs. 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 ntibiotics - RX renewed 05/01/2023 P.O. 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 Remove old dressing, discard into plastic bag and place into trash. Cleanse the wound with Wound Cleanser prior to applying a clean dressing using gauze sponges, not tissues or cotton balls. Do not scrub or use excessive force. Pat dry using gauze sponges, not tissue or cotton balls. Prim Dressing: Silvercel Small 2x2 (in/in) 1 x Per Day/30 Days ary Discharge Instructions: Apply Silvercel Small 2x2 (in/in) as instructed Secondary Dressing: Zetuvit Plus 4x8 (in/in) 1 x Per Day/30 Days Secondary Dressing: Heel Cup 1 x Per Day/30 Days Secured With: Kerlix Roll Sterile or Non-Sterile 6-ply 4.5x4 (yd/yd) 1 x Per Day/30 Days Discharge Instructions: Apply Kerlix as directed Secured With: Tubigrip Size D, 3x10 (  Chip Boer Primary Care Provider: [name] Other Clinician: Referring Provider: Treating Provider/Extender: [name] Weeks in Treatment: 49 Constitutional Well-nourished and well-hydrated in no acute distress. Respiratory normal breathing without difficulty. Psychiatric this patient is able to make decisions and demonstrates good insight into disease process. Alert and Oriented x 3. pleasant and cooperative. Notes Of note patient's dressings that he had in place today had been on since Thursday actually which is quite a bit longer than what is normal we generally have this changed every day. He is not sure exactly why they did not change them but nonetheless I think that is part of the reason why it looked as wet as it did today. In general I think though he does seem to be making some progress here. This is just very slow. Electronic  Signature(s) Signed: 05/18/2023 4:31:28 PM By: [name] PA-C Entered By: [name] on 05/18/2023 13:31:28 -------------------------------------------------------------------------------- Physician Orders Details Patient Name: Date of Service: Chad [name], Chad [name] 05/18/2023 3:30 PM Medical Record Number: [id number] Patient Account Number: [id number] Date of Birth/Sex: Treating RN: 07/04/1961 (62 y.o. 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 Nurse Visit as needed Bathing/ Shower/ Hygiene Clean wound with Normal Saline or wound cleanser. No tub bath. Anesthetic (Use 'Patient Medications' Section for Anesthetic Order Entry) Lidocaine applied to wound bed Edema Control - Lymphedema / Segmental Compressive Device / Other Tubigrip single layer applied. - Size D Elevate legs to the level of the heart and pump ankles as often as possible Elevate leg(s) parallel to the floor when sitting. Other: - PATIENT MUST SLEEP IN A BED. Legs are swelling due to no elevation of legs. 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 ntibiotics - RX renewed 05/01/2023 P.O. 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 Remove old dressing, discard into plastic bag and place into trash. Cleanse the wound with Wound Cleanser prior to applying a clean dressing using gauze sponges, not tissues or cotton balls. Do not scrub or use excessive force. Pat dry using gauze sponges, not tissue or cotton balls. Prim Dressing: Silvercel Small 2x2 (in/in) 1 x Per Day/30 Days ary Discharge Instructions: Apply Silvercel Small 2x2 (in/in) as instructed Secondary Dressing: Zetuvit Plus 4x8 (in/in) 1 x Per Day/30 Days Secondary Dressing: Heel Cup 1 x Per Day/30 Days Secured With: Kerlix Roll Sterile or Non-Sterile 6-ply 4.5x4 (yd/yd) 1 x Per Day/30 Days Discharge Instructions: Apply Kerlix as directed Secured With: Tubigrip Size D, 3x10 (  Chip Boer Primary Care Provider: [name] Other Clinician: Referring Provider: Treating Provider/Extender: [name] Weeks in Treatment: 49 Constitutional Well-nourished and well-hydrated in no acute distress. Respiratory normal breathing without difficulty. Psychiatric this patient is able to make decisions and demonstrates good insight into disease process. Alert and Oriented x 3. pleasant and cooperative. Notes Of note patient's dressings that he had in place today had been on since Thursday actually which is quite a bit longer than what is normal we generally have this changed every day. He is not sure exactly why they did not change them but nonetheless I think that is part of the reason why it looked as wet as it did today. In general I think though he does seem to be making some progress here. This is just very slow. Electronic  Signature(s) Signed: 05/18/2023 4:31:28 PM By: [name] PA-C Entered By: [name] on 05/18/2023 13:31:28 -------------------------------------------------------------------------------- Physician Orders Details Patient Name: Date of Service: Chad [name], Chad [name] 05/18/2023 3:30 PM Medical Record Number: [id number] Patient Account Number: [id number] Date of Birth/Sex: Treating RN: 07/04/1961 (62 y.o. 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 Nurse Visit as needed Bathing/ Shower/ Hygiene Clean wound with Normal Saline or wound cleanser. No tub bath. Anesthetic (Use 'Patient Medications' Section for Anesthetic Order Entry) Lidocaine applied to wound bed Edema Control - Lymphedema / Segmental Compressive Device / Other Tubigrip single layer applied. - Size D Elevate legs to the level of the heart and pump ankles as often as possible Elevate leg(s) parallel to the floor when sitting. Other: - PATIENT MUST SLEEP IN A BED. Legs are swelling due to no elevation of legs. 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 ntibiotics - RX renewed 05/01/2023 P.O. 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 Remove old dressing, discard into plastic bag and place into trash. Cleanse the wound with Wound Cleanser prior to applying a clean dressing using gauze sponges, not tissues or cotton balls. Do not scrub or use excessive force. Pat dry using gauze sponges, not tissue or cotton balls. Prim Dressing: Silvercel Small 2x2 (in/in) 1 x Per Day/30 Days ary Discharge Instructions: Apply Silvercel Small 2x2 (in/in) as instructed Secondary Dressing: Zetuvit Plus 4x8 (in/in) 1 x Per Day/30 Days Secondary Dressing: Heel Cup 1 x Per Day/30 Days Secured With: Kerlix Roll Sterile or Non-Sterile 6-ply 4.5x4 (yd/yd) 1 x Per Day/30 Days Discharge Instructions: Apply Kerlix as directed Secured With: Tubigrip Size D, 3x10 (  1-33%) red, pink granulation within the wound bed. There is a small (1-33%) amount of necrotic tissue within the wound bed including Adherent Slough. Wound #22 status is Open. Original cause of wound was Pressure Injury. The date acquired was: 03/02/2023. The wound has been in treatment 7 weeks. The wound is located on the Right,Anterior Lower Leg. The wound measures 10.5cm length x 10cm width x 0.1cm depth; 82.467cm^2 area and 8.247cm^3 volume. There is Fat Layer (Subcutaneous Tissue) exposed. There is a medium amount of serous drainage noted. There is medium (34-66%) red, pink granulation within the wound bed. There is a small (1-33%) amount of necrotic tissue within the wound bed including Adherent Slough. Wound #23 status is Open. Original cause of wound was Pressure Injury. The date acquired was: 05/02/2023. The wound has been in treatment 1 weeks. The wound is located on the Right,Posterior Lower Leg. The wound measures 6cm length x 6cm width x 0.1cm depth; 28.274cm^2 area and 2.827cm^3 volume. There is Fat Layer (Subcutaneous Tissue) exposed. There is a medium amount of serosanguineous drainage noted. There is medium (34-66%) red, pink granulation within the wound bed. There is a small (1-33%) amount of necrotic tissue within the wound bed including Adherent Slough. 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 I would recommend that we have the patient continue to monitor for any signs of infection or worsening. Based on what I see I do believe that we are making excellent headway  towards closure and I am very pleased that regard. 2. I am going to recommend that the patient should continue with the alginate followed by the XtraSorb. 3. I am also going to recommend the Tubigrip double layer be continued which is helping with edema control and slowing down some of the drainage. We will see patient back for reevaluation in 1 week here in the clinic. If anything worsens or changes patient will contact our office for additional recommendations. Electronic Signature(s) Signed: 05/18/2023 4:32:02 PM By: [name] PA-C Entered By: [name] on 05/18/2023 13:32:02 -------------------------------------------------------------------------------- SuperBill Details Patient Name: Date of Service: ZIGGY, DICAPUA 05/18/2023 Medical Record Number: [id number] Patient Account Number: [id number] Date of Birth/Sex: Treating RN: 08-May-1961 (62 y.o. 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, stage 3 Facility Procedures : CPT4 Code: [id number] Description: 84696 - WOUND CARE VISIT-LEV 5 EST PT Modifier: Quantity: 1 Physician Procedures : CPT4 Code Description Modifier [id number] - WC PHYS LEVEL 3 - EST PT ICD-10 Diagnosis Description 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 (idiopathic) with ulcer and inflammation of left lower  extremity L98.492 Non-pressure chronic ulcer of skin of  other sites with fat layer exposed L97.518 Non-pressure chronic  Chad [name], Chad [name] ([id number]) [id number] Page 1 of 16 Visit Report for 05/18/2023 Chief Complaint Document Details Patient Name: Date of Service: Chad [name], Chad [name] 05/18/2023 3:30 PM Medical Record Number: [id number] Patient Account Number: [id number] Date of Birth/Sex: Treating RN: 03/12/1961 (62 y.o. [name] Primary Care Provider: [name] Other Clinician: Referring Provider: Treating Provider/Extender: [name] Weeks in Treatment: 101 Information Obtained from: Patient Chief Complaint Left ankle ulcer, Left dorsal foot wound, right heel Electronic Signature(s) Signed: 05/18/2023 3:18:54 PM By: [name] PA-C Entered By: [name] on 05/18/2023 12:18:54 -------------------------------------------------------------------------------- HPI Details Patient Name: Date of Service: Chad [name] 05/18/2023 3:30 PM Medical Record Number: [id number] Patient Account Number: [id number] Date of Birth/Sex: Treating RN: 1960/08/07 (62 y.o. [name] Primary Care Provider: [name] Other Clinician: Referring Provider: Treating Provider/Extender: [name] Weeks in Treatment: 17 History of Present Illness HPI Description: 10/08/18 on evaluation today patient actually presents to our office for initial evaluation concerning wounds that he has of the bilateral lower extremities. 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 He also has bilateral venous stasis. He apparently has been homeless currently following discharge from the hospital apparently he has been placed at [facility] which is is a skilled nursing facility locally. Nonetheless fortunately he does not show any signs of infection at this time which is good news. In fact several of the wound  actually appears to be showing some signs of improvement already in my pinion. There are a couple areas in the left leg in particular there likely gonna require some sharp debridement to help clear away some necrotic tissue and help with more sufficient healing. No fevers, chills, nausea, or vomiting noted at this time. 10/15/18 on evaluation today patient actually appears to be doing very well in regard to his bilateral lower extremities. He's been tolerating the dressing changes without complication. Fortunately there does not appear to be any evidence of active infection at this time which is great news. Overall I'm actually very pleased with how this has progressed in just one visits time. Readmission: 08/14/2020 upon evaluation today patient presents for re-evaluation here in our clinic. He is having issues with his left ankle region as well as his right toe and his right heel. He tells me that the toe and heel actually began as a area that was itching that he was scratching and then subsequently opened up into wounds. These may have been abscess areas I presume based on what I am seeing currently. With regard to his left ankle region he tells me this was a similar type Chad [name], Chad [name] ([id number]) [id number] Page 2 of 16 occurrence although he does have venous stasis this very well may be more of a venous leg ulcer more than anything. Nonetheless I do believe that the patient would benefit from appropriate and aggressive wound care to try to help get things under better control here. He does have history of a stroke on the left side affecting him to some degree there that he is able to stand although he does have some residual weakness. Otherwise again the patient does have chronic venous insufficiency as previously noted. 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 No doubt this has a lot to do with the discomfort in the right leg he describes when he is in bed. They have been using Xeroform on the wounds on the right. He has a lot of swelling in this leg with weeping edema 04-09-2023 upon evaluation today patient appears to be doing poorly still in regard to his right leg. 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  Fortunately I do not see any signs of active infection locally or systemically at this time which is great news. No fevers, chills, nausea, vomiting, or diarrhea. With that being said I am concerned still about the fact that he seems to be getting worse especially in regard to the heels I think that he is still sitting up most of the day which I think is still causing some swelling of his leg unfortunately. 04-16-2023 upon evaluation today patient actually appears to be doing little bit more poorly compared to last week this actually does appear to be infected. Last week I really did not think that was the case but this seems like it may indeed be the case. Fortunately I do not see any signs of active infection locally or systemically at this time. 04-24-2023 upon evaluation today patient appears to be doing well currently in regard to his leg compared to where we were previous I do feel like that the antibiotics have been of benefit. Fortunately I do not see any signs of active infection at this time which is great news and in general I believe that we are making headway towards healing which is great news as well. With that being said he still has a lot of drainage I really feel like something like Anola Gurney would be better for him than just an ABD pad it would likely the drainage more or even Zetuvit's but I am not sure whether we can get either 1 of these at the facility for him. 05-01-2023 upon evaluation today patient appears to be doing well currently in regard to his wounds which are showing signs of significant improvement is not any way shape or form infected like it was last time I saw him. I think the antibiotics are doing a good job but I think he may need an extension here to ensure Chad [name], Chad [name] ([id number]) [id number] Page 6 of 16 that it completely clears. 05-08-2023 upon evaluation today patient appears to be doing well currently in regard to his wounds for  the most part. He still has blue-green drainage noted but in general seems to be doing much better compared to where we were previous. Fortunately I do not see any signs of active infection locally or systemically which is great news and in general I do believe that we are making good headway towards complete closure. 05-18-2023 upon evaluation today patient appears to be doing well currently in regard to his wounds I feel like he is definitely making some improvements here. This is a slow improvement but nonetheless improvement. I do believe that he is doing quite well as far as infection is concerned which is also good news. Electronic Signature(s) Signed: 05/18/2023 4:31:05 PM By: [name] PA-C Entered By: [name] on 05/18/2023 13:31:05 -------------------------------------------------------------------------------- Physical Exam Details Patient Name: Date of Service: Chad [name], Chad [name] 05/18/2023 3:30 PM Medical Record Number: [id number] Patient Account Number: [id number] Date of Birth/Sex: Treating RN: 1960/10/17 (62 y.o. Dwan Bolt,  Chad [name], Chad [name] ([id number]) [id number] Page 1 of 16 Visit Report for 05/18/2023 Chief Complaint Document Details Patient Name: Date of Service: Chad [name], Chad [name] 05/18/2023 3:30 PM Medical Record Number: [id number] Patient Account Number: [id number] Date of Birth/Sex: Treating RN: 03/12/1961 (62 y.o. [name] Primary Care Provider: [name] Other Clinician: Referring Provider: Treating Provider/Extender: [name] Weeks in Treatment: 101 Information Obtained from: Patient Chief Complaint Left ankle ulcer, Left dorsal foot wound, right heel Electronic Signature(s) Signed: 05/18/2023 3:18:54 PM By: [name] PA-C Entered By: [name] on 05/18/2023 12:18:54 -------------------------------------------------------------------------------- HPI Details Patient Name: Date of Service: Chad [name] 05/18/2023 3:30 PM Medical Record Number: [id number] Patient Account Number: [id number] Date of Birth/Sex: Treating RN: 1960/08/07 (62 y.o. [name] Primary Care Provider: [name] Other Clinician: Referring Provider: Treating Provider/Extender: [name] Weeks in Treatment: 17 History of Present Illness HPI Description: 10/08/18 on evaluation today patient actually presents to our office for initial evaluation concerning wounds that he has of the bilateral lower extremities. 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 He also has bilateral venous stasis. He apparently has been homeless currently following discharge from the hospital apparently he has been placed at [facility] which is is a skilled nursing facility locally. Nonetheless fortunately he does not show any signs of infection at this time which is good news. In fact several of the wound  actually appears to be showing some signs of improvement already in my pinion. There are a couple areas in the left leg in particular there likely gonna require some sharp debridement to help clear away some necrotic tissue and help with more sufficient healing. No fevers, chills, nausea, or vomiting noted at this time. 10/15/18 on evaluation today patient actually appears to be doing very well in regard to his bilateral lower extremities. He's been tolerating the dressing changes without complication. Fortunately there does not appear to be any evidence of active infection at this time which is great news. Overall I'm actually very pleased with how this has progressed in just one visits time. Readmission: 08/14/2020 upon evaluation today patient presents for re-evaluation here in our clinic. He is having issues with his left ankle region as well as his right toe and his right heel. He tells me that the toe and heel actually began as a area that was itching that he was scratching and then subsequently opened up into wounds. These may have been abscess areas I presume based on what I am seeing currently. With regard to his left ankle region he tells me this was a similar type Chad [name], Chad [name] ([id number]) [id number] Page 2 of 16 occurrence although he does have venous stasis this very well may be more of a venous leg ulcer more than anything. Nonetheless I do believe that the patient would benefit from appropriate and aggressive wound care to try to help get things under better control here. He does have history of a stroke on the left side affecting him to some degree there that he is able to stand although he does have some residual weakness. Otherwise again the patient does have chronic venous insufficiency as previously noted. His arterial studies most recently obtained showed that he had an ABI on the right of 1.16 with a TBI of 0.52 and on the left and ABI of 1.14 with a  1-33%) red, pink granulation within the wound bed. There is a small (1-33%) amount of necrotic tissue within the wound bed including Adherent Slough. Wound #22 status is Open. Original cause of wound was Pressure Injury. The date acquired was: 03/02/2023. The wound has been in treatment 7 weeks. The wound is located on the Right,Anterior Lower Leg. The wound measures 10.5cm length x 10cm width x 0.1cm depth; 82.467cm^2 area and 8.247cm^3 volume. There is Fat Layer (Subcutaneous Tissue) exposed. There is a medium amount of serous drainage noted. There is medium (34-66%) red, pink granulation within the wound bed. There is a small (1-33%) amount of necrotic tissue within the wound bed including Adherent Slough. Wound #23 status is Open. Original cause of wound was Pressure Injury. The date acquired was: 05/02/2023. The wound has been in treatment 1 weeks. The wound is located on the Right,Posterior Lower Leg. The wound measures 6cm length x 6cm width x 0.1cm depth; 28.274cm^2 area and 2.827cm^3 volume. There is Fat Layer (Subcutaneous Tissue) exposed. There is a medium amount of serosanguineous drainage noted. There is medium (34-66%) red, pink granulation within the wound bed. There is a small (1-33%) amount of necrotic tissue within the wound bed including Adherent Slough. 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 I would recommend that we have the patient continue to monitor for any signs of infection or worsening. Based on what I see I do believe that we are making excellent headway  towards closure and I am very pleased that regard. 2. I am going to recommend that the patient should continue with the alginate followed by the XtraSorb. 3. I am also going to recommend the Tubigrip double layer be continued which is helping with edema control and slowing down some of the drainage. We will see patient back for reevaluation in 1 week here in the clinic. If anything worsens or changes patient will contact our office for additional recommendations. Electronic Signature(s) Signed: 05/18/2023 4:32:02 PM By: [name] PA-C Entered By: [name] on 05/18/2023 13:32:02 -------------------------------------------------------------------------------- SuperBill Details Patient Name: Date of Service: ZIGGY, DICAPUA 05/18/2023 Medical Record Number: [id number] Patient Account Number: [id number] Date of Birth/Sex: Treating RN: 08-May-1961 (62 y.o. 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, stage 3 Facility Procedures : CPT4 Code: 21308657 Description: 84696 - WOUND CARE VISIT-LEV 5 EST PT Modifier: Quantity: 1 Physician Procedures : CPT4 Code Description Modifier [id number] 99213 - WC PHYS LEVEL 3 - EST PT ICD-10 Diagnosis Description 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 (idiopathic) with ulcer and inflammation of left lower  extremity L98.492 Non-pressure chronic ulcer of skin of  other sites with fat layer exposed L97.518 Non-pressure chronic  gauze sponges, not tissue or cotton balls. Prim Dressing: Silvercel Small 2x2 (in/in) 1 x Per Day/30 Days ary Discharge Instructions: Apply Silvercel Small 2x2 (in/in) as instructed Secondary Dressing: Zetuvit Plus 4x8 (in/in) 1 x Per Day/30 Days Secondary Dressing: Heel Cup 1 x Per Day/30 Days Secured With: Kerlix Roll Sterile or Non-Sterile 6-ply 4.5x4 (yd/yd) 1 x Per Day/30 Days Discharge Instructions: Apply Kerlix as directed Secured With: Tubigrip Size D, 3x10 (in/yd) 1 x Per Day/30 Days Electronic Signature(s) Signed: 05/18/2023 4:50:43 PM By: [name] PA-C Signed: 05/18/2023 5:06:23 PM By: Midge Aver MSN RN CNS WTA Entered By: Midge Aver on 05/18/2023 13:35:15 [name] ([id number]) [id number] Page 9 of 16 -------------------------------------------------------------------------------- Problem List Details Patient Name: Date of  Service: Chad [name], Chad [name] 05/18/2023 3:30 PM Medical Record Number: [id number] Patient Account Number: [id number] Date of Birth/Sex: Treating RN: 1961-06-11 (62 y.o. 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 02/19/2023 No Yes foot 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, stage 3 10/07/2022 No Yes Inactive Problems ICD-10 Code Description Active Date Inactive Date L97.322 Non-pressure chronic ulcer of left ankle with fat layer exposed 12/06/2021 12/06/2021 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 [name] Primary Care Provider: [name] Other Clinician: Referring Provider: Treating Provider/Extender: [name] Weeks in Treatment: 86 Subjective Chief Complaint Information  obtained from Patient Left ankle ulcer, Left dorsal foot wound, right heel History of Present Illness (HPI) 10/08/18 on evaluation today patient actually presents to our office for initial evaluation concerning wounds that he has of the bilateral lower extremities. 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 He also has bilateral venous stasis. He apparently has been homeless currently following discharge from the hospital apparently he has been placed at [facility] which is is a skilled nursing facility locally. Nonetheless fortunately he does not show any signs of infection at this time which is good news. In fact several of the wound actually appears to be showing some signs of improvement already in my pinion. There are a couple areas in the left leg in particular there likely gonna require some sharp debridement to help clear away some necrotic tissue and help with more sufficient healing. No fevers, chills, nausea, or vomiting noted at this time. 10/15/18 on evaluation today patient actually appears to be doing very well in regard to his bilateral lower extremities. He's been tolerating the dressing changes without complication. Fortunately there does not appear to be any evidence of active  1-33%) red, pink granulation within the wound bed. There is a small (1-33%) amount of necrotic tissue within the wound bed including Adherent Slough. Wound #22 status is Open. Original cause of wound was Pressure Injury. The date acquired was: 03/02/2023. The wound has been in treatment 7 weeks. The wound is located on the Right,Anterior Lower Leg. The wound measures 10.5cm length x 10cm width x 0.1cm depth; 82.467cm^2 area and 8.247cm^3 volume. There is Fat Layer (Subcutaneous Tissue) exposed. There is a medium amount of serous drainage noted. There is medium (34-66%) red, pink granulation within the wound bed. There is a small (1-33%) amount of necrotic tissue within the wound bed including Adherent Slough. Wound #23 status is Open. Original cause of wound was Pressure Injury. The date acquired was: 05/02/2023. The wound has been in treatment 1 weeks. The wound is located on the Right,Posterior Lower Leg. The wound measures 6cm length x 6cm width x 0.1cm depth; 28.274cm^2 area and 2.827cm^3 volume. There is Fat Layer (Subcutaneous Tissue) exposed. There is a medium amount of serosanguineous drainage noted. There is medium (34-66%) red, pink granulation within the wound bed. There is a small (1-33%) amount of necrotic tissue within the wound bed including Adherent Slough. 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 I would recommend that we have the patient continue to monitor for any signs of infection or worsening. Based on what I see I do believe that we are making excellent headway  towards closure and I am very pleased that regard. 2. I am going to recommend that the patient should continue with the alginate followed by the XtraSorb. 3. I am also going to recommend the Tubigrip double layer be continued which is helping with edema control and slowing down some of the drainage. We will see patient back for reevaluation in 1 week here in the clinic. If anything worsens or changes patient will contact our office for additional recommendations. Electronic Signature(s) Signed: 05/18/2023 4:32:02 PM By: [name] PA-C Entered By: [name] on 05/18/2023 13:32:02 -------------------------------------------------------------------------------- SuperBill Details Patient Name: Date of Service: ZIGGY, DICAPUA 05/18/2023 Medical Record Number: [id number] Patient Account Number: [id number] Date of Birth/Sex: Treating RN: 08-May-1961 (62 y.o. 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, stage 3 Facility Procedures : CPT4 Code: 21308657 Description: 84696 - WOUND CARE VISIT-LEV 5 EST PT Modifier: Quantity: 1 Physician Procedures : CPT4 Code Description Modifier [id number] 99213 - WC PHYS LEVEL 3 - EST PT ICD-10 Diagnosis Description 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 (idiopathic) with ulcer and inflammation of left lower  extremity L98.492 Non-pressure chronic ulcer of skin of  other sites with fat layer exposed L97.518 Non-pressure chronic  1-33%) red, pink granulation within the wound bed. There is a small (1-33%) amount of necrotic tissue within the wound bed including Adherent Slough. Wound #22 status is Open. Original cause of wound was Pressure Injury. The date acquired was: 03/02/2023. The wound has been in treatment 7 weeks. The wound is located on the Right,Anterior Lower Leg. The wound measures 10.5cm length x 10cm width x 0.1cm depth; 82.467cm^2 area and 8.247cm^3 volume. There is Fat Layer (Subcutaneous Tissue) exposed. There is a medium amount of serous drainage noted. There is medium (34-66%) red, pink granulation within the wound bed. There is a small (1-33%) amount of necrotic tissue within the wound bed including Adherent Slough. Wound #23 status is Open. Original cause of wound was Pressure Injury. The date acquired was: 05/02/2023. The wound has been in treatment 1 weeks. The wound is located on the Right,Posterior Lower Leg. The wound measures 6cm length x 6cm width x 0.1cm depth; 28.274cm^2 area and 2.827cm^3 volume. There is Fat Layer (Subcutaneous Tissue) exposed. There is a medium amount of serosanguineous drainage noted. There is medium (34-66%) red, pink granulation within the wound bed. There is a small (1-33%) amount of necrotic tissue within the wound bed including Adherent Slough. 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 I would recommend that we have the patient continue to monitor for any signs of infection or worsening. Based on what I see I do believe that we are making excellent headway  towards closure and I am very pleased that regard. 2. I am going to recommend that the patient should continue with the alginate followed by the XtraSorb. 3. I am also going to recommend the Tubigrip double layer be continued which is helping with edema control and slowing down some of the drainage. We will see patient back for reevaluation in 1 week here in the clinic. If anything worsens or changes patient will contact our office for additional recommendations. Electronic Signature(s) Signed: 05/18/2023 4:32:02 PM By: [name] PA-C Entered By: [name] on 05/18/2023 13:32:02 -------------------------------------------------------------------------------- SuperBill Details Patient Name: Date of Service: ZIGGY, DICAPUA 05/18/2023 Medical Record Number: [id number] Patient Account Number: [id number] Date of Birth/Sex: Treating RN: 08-May-1961 (62 y.o. 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, stage 3 Facility Procedures : CPT4 Code: 21308657 Description: 84696 - WOUND CARE VISIT-LEV 5 EST PT Modifier: Quantity: 1 Physician Procedures : CPT4 Code Description Modifier [id number] 99213 - WC PHYS LEVEL 3 - EST PT ICD-10 Diagnosis Description 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 (idiopathic) with ulcer and inflammation of left lower  extremity L98.492 Non-pressure chronic ulcer of skin of  other sites with fat layer exposed L97.518 Non-pressure chronic  1-33%) red, pink granulation within the wound bed. There is a small (1-33%) amount of necrotic tissue within the wound bed including Adherent Slough. Wound #22 status is Open. Original cause of wound was Pressure Injury. The date acquired was: 03/02/2023. The wound has been in treatment 7 weeks. The wound is located on the Right,Anterior Lower Leg. The wound measures 10.5cm length x 10cm width x 0.1cm depth; 82.467cm^2 area and 8.247cm^3 volume. There is Fat Layer (Subcutaneous Tissue) exposed. There is a medium amount of serous drainage noted. There is medium (34-66%) red, pink granulation within the wound bed. There is a small (1-33%) amount of necrotic tissue within the wound bed including Adherent Slough. Wound #23 status is Open. Original cause of wound was Pressure Injury. The date acquired was: 05/02/2023. The wound has been in treatment 1 weeks. The wound is located on the Right,Posterior Lower Leg. The wound measures 6cm length x 6cm width x 0.1cm depth; 28.274cm^2 area and 2.827cm^3 volume. There is Fat Layer (Subcutaneous Tissue) exposed. There is a medium amount of serosanguineous drainage noted. There is medium (34-66%) red, pink granulation within the wound bed. There is a small (1-33%) amount of necrotic tissue within the wound bed including Adherent Slough. 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 I would recommend that we have the patient continue to monitor for any signs of infection or worsening. Based on what I see I do believe that we are making excellent headway  towards closure and I am very pleased that regard. 2. I am going to recommend that the patient should continue with the alginate followed by the XtraSorb. 3. I am also going to recommend the Tubigrip double layer be continued which is helping with edema control and slowing down some of the drainage. We will see patient back for reevaluation in 1 week here in the clinic. If anything worsens or changes patient will contact our office for additional recommendations. Electronic Signature(s) Signed: 05/18/2023 4:32:02 PM By: [name] PA-C Entered By: [name] on 05/18/2023 13:32:02 -------------------------------------------------------------------------------- SuperBill Details Patient Name: Date of Service: ZIGGY, DICAPUA 05/18/2023 Medical Record Number: [id number] Patient Account Number: [id number] Date of Birth/Sex: Treating RN: 08-May-1961 (62 y.o. 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, stage 3 Facility Procedures : CPT4 Code: 21308657 Description: 84696 - WOUND CARE VISIT-LEV 5 EST PT Modifier: Quantity: 1 Physician Procedures : CPT4 Code Description Modifier [id number] 99213 - WC PHYS LEVEL 3 - EST PT ICD-10 Diagnosis Description 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 (idiopathic) with ulcer and inflammation of left lower  extremity L98.492 Non-pressure chronic ulcer of skin of  other sites with fat layer exposed L97.518 Non-pressure chronic  Chip Boer Primary Care Provider: [name] Other Clinician: Referring Provider: Treating Provider/Extender: [name] Weeks in Treatment: 49 Constitutional Well-nourished and well-hydrated in no acute distress. Respiratory normal breathing without difficulty. Psychiatric this patient is able to make decisions and demonstrates good insight into disease process. Alert and Oriented x 3. pleasant and cooperative. Notes Of note patient's dressings that he had in place today had been on since Thursday actually which is quite a bit longer than what is normal we generally have this changed every day. He is not sure exactly why they did not change them but nonetheless I think that is part of the reason why it looked as wet as it did today. In general I think though he does seem to be making some progress here. This is just very slow. Electronic  Signature(s) Signed: 05/18/2023 4:31:28 PM By: [name] PA-C Entered By: [name] on 05/18/2023 13:31:28 -------------------------------------------------------------------------------- Physician Orders Details Patient Name: Date of Service: Chad [name], Chad [name] 05/18/2023 3:30 PM Medical Record Number: [id number] Patient Account Number: [id number] Date of Birth/Sex: Treating RN: 07/04/1961 (62 y.o. 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 Nurse Visit as needed Bathing/ Shower/ Hygiene Clean wound with Normal Saline or wound cleanser. No tub bath. Anesthetic (Use 'Patient Medications' Section for Anesthetic Order Entry) Lidocaine applied to wound bed Edema Control - Lymphedema / Segmental Compressive Device / Other Tubigrip single layer applied. - Size D Elevate legs to the level of the heart and pump ankles as often as possible Elevate leg(s) parallel to the floor when sitting. Other: - PATIENT MUST SLEEP IN A BED. Legs are swelling due to no elevation of legs. 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 ntibiotics - RX renewed 05/01/2023 P.O. 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 Remove old dressing, discard into plastic bag and place into trash. Cleanse the wound with Wound Cleanser prior to applying a clean dressing using gauze sponges, not tissues or cotton balls. Do not scrub or use excessive force. Pat dry using gauze sponges, not tissue or cotton balls. Prim Dressing: Silvercel Small 2x2 (in/in) 1 x Per Day/30 Days ary Discharge Instructions: Apply Silvercel Small 2x2 (in/in) as instructed Secondary Dressing: Zetuvit Plus 4x8 (in/in) 1 x Per Day/30 Days Secondary Dressing: Heel Cup 1 x Per Day/30 Days Secured With: Kerlix Roll Sterile or Non-Sterile 6-ply 4.5x4 (yd/yd) 1 x Per Day/30 Days Discharge Instructions: Apply Kerlix as directed Secured With: Tubigrip Size D, 3x10 (  Chad [name], Chad [name] ([id number]) [id number] Page 1 of 16 Visit Report for 05/18/2023 Chief Complaint Document Details Patient Name: Date of Service: Chad [name], Chad [name] 05/18/2023 3:30 PM Medical Record Number: [id number] Patient Account Number: [id number] Date of Birth/Sex: Treating RN: 03/12/1961 (62 y.o. [name] Primary Care Provider: [name] Other Clinician: Referring Provider: Treating Provider/Extender: [name] Weeks in Treatment: 101 Information Obtained from: Patient Chief Complaint Left ankle ulcer, Left dorsal foot wound, right heel Electronic Signature(s) Signed: 05/18/2023 3:18:54 PM By: [name] PA-C Entered By: [name] on 05/18/2023 12:18:54 -------------------------------------------------------------------------------- HPI Details Patient Name: Date of Service: Chad [name] 05/18/2023 3:30 PM Medical Record Number: [id number] Patient Account Number: [id number] Date of Birth/Sex: Treating RN: 1960/08/07 (62 y.o. [name] Primary Care Provider: [name] Other Clinician: Referring Provider: Treating Provider/Extender: [name] Weeks in Treatment: 17 History of Present Illness HPI Description: 10/08/18 on evaluation today patient actually presents to our office for initial evaluation concerning wounds that he has of the bilateral lower extremities. 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 He also has bilateral venous stasis. He apparently has been homeless currently following discharge from the hospital apparently he has been placed at [facility] which is is a skilled nursing facility locally. Nonetheless fortunately he does not show any signs of infection at this time which is good news. In fact several of the wound  actually appears to be showing some signs of improvement already in my pinion. There are a couple areas in the left leg in particular there likely gonna require some sharp debridement to help clear away some necrotic tissue and help with more sufficient healing. No fevers, chills, nausea, or vomiting noted at this time. 10/15/18 on evaluation today patient actually appears to be doing very well in regard to his bilateral lower extremities. He's been tolerating the dressing changes without complication. Fortunately there does not appear to be any evidence of active infection at this time which is great news. Overall I'm actually very pleased with how this has progressed in just one visits time. Readmission: 08/14/2020 upon evaluation today patient presents for re-evaluation here in our clinic. He is having issues with his left ankle region as well as his right toe and his right heel. He tells me that the toe and heel actually began as a area that was itching that he was scratching and then subsequently opened up into wounds. These may have been abscess areas I presume based on what I am seeing currently. With regard to his left ankle region he tells me this was a similar type Chad [name], Chad [name] ([id number]) [id number] Page 2 of 16 occurrence although he does have venous stasis this very well may be more of a venous leg ulcer more than anything. Nonetheless I do believe that the patient would benefit from appropriate and aggressive wound care to try to help get things under better control here. He does have history of a stroke on the left side affecting him to some degree there that he is able to stand although he does have some residual weakness. Otherwise again the patient does have chronic venous insufficiency as previously noted. His arterial studies most recently obtained showed that he had an ABI on the right of 1.16 with a TBI of 0.52 and on the left and ABI of 1.14 with a  gauze sponges, not tissue or cotton balls. Prim Dressing: Silvercel Small 2x2 (in/in) 1 x Per Day/30 Days ary Discharge Instructions: Apply Silvercel Small 2x2 (in/in) as instructed Secondary Dressing: Zetuvit Plus 4x8 (in/in) 1 x Per Day/30 Days Secondary Dressing: Heel Cup 1 x Per Day/30 Days Secured With: Kerlix Roll Sterile or Non-Sterile 6-ply 4.5x4 (yd/yd) 1 x Per Day/30 Days Discharge Instructions: Apply Kerlix as directed Secured With: Tubigrip Size D, 3x10 (in/yd) 1 x Per Day/30 Days Electronic Signature(s) Signed: 05/18/2023 4:50:43 PM By: [name] PA-C Signed: 05/18/2023 5:06:23 PM By: Midge Aver MSN RN CNS WTA Entered By: Midge Aver on 05/18/2023 13:35:15 [name] ([id number]) [id number] Page 9 of 16 -------------------------------------------------------------------------------- Problem List Details Patient Name: Date of  Service: Chad [name], Chad [name] 05/18/2023 3:30 PM Medical Record Number: [id number] Patient Account Number: [id number] Date of Birth/Sex: Treating RN: 1961-06-11 (62 y.o. 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 02/19/2023 No Yes foot 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, stage 3 10/07/2022 No Yes Inactive Problems ICD-10 Code Description Active Date Inactive Date L97.322 Non-pressure chronic ulcer of left ankle with fat layer exposed 12/06/2021 12/06/2021 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 [name] Primary Care Provider: [name] Other Clinician: Referring Provider: Treating Provider/Extender: [name] Weeks in Treatment: 86 Subjective Chief Complaint Information  obtained from Patient Left ankle ulcer, Left dorsal foot wound, right heel History of Present Illness (HPI) 10/08/18 on evaluation today patient actually presents to our office for initial evaluation concerning wounds that he has of the bilateral lower extremities. 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 He also has bilateral venous stasis. He apparently has been homeless currently following discharge from the hospital apparently he has been placed at [facility] which is is a skilled nursing facility locally. Nonetheless fortunately he does not show any signs of infection at this time which is good news. In fact several of the wound actually appears to be showing some signs of improvement already in my pinion. There are a couple areas in the left leg in particular there likely gonna require some sharp debridement to help clear away some necrotic tissue and help with more sufficient healing. No fevers, chills, nausea, or vomiting noted at this time. 10/15/18 on evaluation today patient actually appears to be doing very well in regard to his bilateral lower extremities. He's been tolerating the dressing changes without complication. Fortunately there does not appear to be any evidence of active  gauze sponges, not tissue or cotton balls. Prim Dressing: Silvercel Small 2x2 (in/in) 1 x Per Day/30 Days ary Discharge Instructions: Apply Silvercel Small 2x2 (in/in) as instructed Secondary Dressing: Zetuvit Plus 4x8 (in/in) 1 x Per Day/30 Days Secondary Dressing: Heel Cup 1 x Per Day/30 Days Secured With: Kerlix Roll Sterile or Non-Sterile 6-ply 4.5x4 (yd/yd) 1 x Per Day/30 Days Discharge Instructions: Apply Kerlix as directed Secured With: Tubigrip Size D, 3x10 (in/yd) 1 x Per Day/30 Days Electronic Signature(s) Signed: 05/18/2023 4:50:43 PM By: [name] PA-C Signed: 05/18/2023 5:06:23 PM By: Midge Aver MSN RN CNS WTA Entered By: Midge Aver on 05/18/2023 13:35:15 [name] ([id number]) [id number] Page 9 of 16 -------------------------------------------------------------------------------- Problem List Details Patient Name: Date of  Service: Chad [name], Chad [name] 05/18/2023 3:30 PM Medical Record Number: [id number] Patient Account Number: [id number] Date of Birth/Sex: Treating RN: 1961-06-11 (62 y.o. 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 02/19/2023 No Yes foot 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, stage 3 10/07/2022 No Yes Inactive Problems ICD-10 Code Description Active Date Inactive Date L97.322 Non-pressure chronic ulcer of left ankle with fat layer exposed 12/06/2021 12/06/2021 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 [name] Primary Care Provider: [name] Other Clinician: Referring Provider: Treating Provider/Extender: [name] Weeks in Treatment: 86 Subjective Chief Complaint Information  obtained from Patient Left ankle ulcer, Left dorsal foot wound, right heel History of Present Illness (HPI) 10/08/18 on evaluation today patient actually presents to our office for initial evaluation concerning wounds that he has of the bilateral lower extremities. 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 He also has bilateral venous stasis. He apparently has been homeless currently following discharge from the hospital apparently he has been placed at [facility] which is is a skilled nursing facility locally. Nonetheless fortunately he does not show any signs of infection at this time which is good news. In fact several of the wound actually appears to be showing some signs of improvement already in my pinion. There are a couple areas in the left leg in particular there likely gonna require some sharp debridement to help clear away some necrotic tissue and help with more sufficient healing. No fevers, chills, nausea, or vomiting noted at this time. 10/15/18 on evaluation today patient actually appears to be doing very well in regard to his bilateral lower extremities. He's been tolerating the dressing changes without complication. Fortunately there does not appear to be any evidence of active  gauze sponges, not tissue or cotton balls. Prim Dressing: Silvercel Small 2x2 (in/in) 1 x Per Day/30 Days ary Discharge Instructions: Apply Silvercel Small 2x2 (in/in) as instructed Secondary Dressing: Zetuvit Plus 4x8 (in/in) 1 x Per Day/30 Days Secondary Dressing: Heel Cup 1 x Per Day/30 Days Secured With: Kerlix Roll Sterile or Non-Sterile 6-ply 4.5x4 (yd/yd) 1 x Per Day/30 Days Discharge Instructions: Apply Kerlix as directed Secured With: Tubigrip Size D, 3x10 (in/yd) 1 x Per Day/30 Days Electronic Signature(s) Signed: 05/18/2023 4:50:43 PM By: [name] PA-C Signed: 05/18/2023 5:06:23 PM By: Midge Aver MSN RN CNS WTA Entered By: Midge Aver on 05/18/2023 13:35:15 [name] ([id number]) [id number] Page 9 of 16 -------------------------------------------------------------------------------- Problem List Details Patient Name: Date of  Service: Chad [name], Chad [name] 05/18/2023 3:30 PM Medical Record Number: [id number] Patient Account Number: [id number] Date of Birth/Sex: Treating RN: 1961-06-11 (62 y.o. 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 02/19/2023 No Yes foot 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, stage 3 10/07/2022 No Yes Inactive Problems ICD-10 Code Description Active Date Inactive Date L97.322 Non-pressure chronic ulcer of left ankle with fat layer exposed 12/06/2021 12/06/2021 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 [name] Primary Care Provider: [name] Other Clinician: Referring Provider: Treating Provider/Extender: [name] Weeks in Treatment: 86 Subjective Chief Complaint Information  obtained from Patient Left ankle ulcer, Left dorsal foot wound, right heel History of Present Illness (HPI) 10/08/18 on evaluation today patient actually presents to our office for initial evaluation concerning wounds that he has of the bilateral lower extremities. 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 He also has bilateral venous stasis. He apparently has been homeless currently following discharge from the hospital apparently he has been placed at [facility] which is is a skilled nursing facility locally. Nonetheless fortunately he does not show any signs of infection at this time which is good news. In fact several of the wound actually appears to be showing some signs of improvement already in my pinion. There are a couple areas in the left leg in particular there likely gonna require some sharp debridement to help clear away some necrotic tissue and help with more sufficient healing. No fevers, chills, nausea, or vomiting noted at this time. 10/15/18 on evaluation today patient actually appears to be doing very well in regard to his bilateral lower extremities. He's been tolerating the dressing changes without complication. Fortunately there does not appear to be any evidence of active  occlusion of the anterior and posterior tibial arteries without any distal [place] reconstitution. No doubt this has a lot to do with the discomfort in the right leg he describes when he is in bed. They have been using Xeroform on the wounds on the right. He has a lot of swelling in this leg with weeping edema 04-09-2023 upon evaluation today patient appears to be doing poorly still in regard to his right leg. 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  Fortunately I do not see any signs of active infection locally or systemically at this time which is great news. No fevers, chills, nausea, vomiting, or diarrhea. With that being said I am concerned still about the fact that he seems to be getting worse especially in regard to the heels I think that he is still sitting up most of the day which I think is still causing some swelling of his leg unfortunately. 04-16-2023 upon evaluation today patient actually appears to be doing little bit more poorly compared to last week this actually does appear to be infected. Last week I really did not think that was the case but this seems like it may indeed be the case. Fortunately I do not see any signs of active infection locally or systemically at this time. 04-24-2023 upon evaluation today patient appears to be doing well currently in regard to his leg compared to where we were previous I do feel like that the antibiotics have been of benefit. Fortunately I do not see any signs of active infection at this time which is great news and in general I believe that we are making headway towards healing which is great news as well. With that being said he still has a lot of drainage I really feel like something like Anola Gurney would be better for him than just an ABD pad it would likely the drainage more or even Zetuvit's but I am not sure whether we can get either 1 of these at the facility for him. 05-01-2023 upon evaluation today patient appears to be doing well currently in regard to his wounds which are showing signs of significant improvement is not any way shape or form infected like it was last time I saw him. I think the antibiotics are doing a good job but I think he may need an extension here to ensure Chad [name], Chad [name] ([id number]) [id number] Page 6 of 16 that it completely clears. 05-08-2023 upon evaluation today patient appears to be doing well currently in regard to his wounds for  the most part. He still has blue-green drainage noted but in general seems to be doing much better compared to where we were previous. Fortunately I do not see any signs of active infection locally or systemically which is great news and in general I do believe that we are making good headway towards complete closure. 05-18-2023 upon evaluation today patient appears to be doing well currently in regard to his wounds I feel like he is definitely making some improvements here. This is a slow improvement but nonetheless improvement. I do believe that he is doing quite well as far as infection is concerned which is also good news. Electronic Signature(s) Signed: 05/18/2023 4:31:05 PM By: [name] PA-C Entered By: [name] on 05/18/2023 13:31:05 -------------------------------------------------------------------------------- Physical Exam Details Patient Name: Date of Service: Chad [name], Chad [name] 05/18/2023 3:30 PM Medical Record Number: [id number] Patient Account Number: [id number] Date of Birth/Sex: Treating RN: 1960/10/17 (62 y.o. Dwan Bolt,  Chip Boer Primary Care Provider: [name] Other Clinician: Referring Provider: Treating Provider/Extender: [name] Weeks in Treatment: 49 Constitutional Well-nourished and well-hydrated in no acute distress. Respiratory normal breathing without difficulty. Psychiatric this patient is able to make decisions and demonstrates good insight into disease process. Alert and Oriented x 3. pleasant and cooperative. Notes Of note patient's dressings that he had in place today had been on since Thursday actually which is quite a bit longer than what is normal we generally have this changed every day. He is not sure exactly why they did not change them but nonetheless I think that is part of the reason why it looked as wet as it did today. In general I think though he does seem to be making some progress here. This is just very slow. Electronic  Signature(s) Signed: 05/18/2023 4:31:28 PM By: [name] PA-C Entered By: [name] on 05/18/2023 13:31:28 -------------------------------------------------------------------------------- Physician Orders Details Patient Name: Date of Service: Chad [name], Chad [name] 05/18/2023 3:30 PM Medical Record Number: [id number] Patient Account Number: [id number] Date of Birth/Sex: Treating RN: 07/04/1961 (62 y.o. 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 Nurse Visit as needed Bathing/ Shower/ Hygiene Clean wound with Normal Saline or wound cleanser. No tub bath. Anesthetic (Use 'Patient Medications' Section for Anesthetic Order Entry) Lidocaine applied to wound bed Edema Control - Lymphedema / Segmental Compressive Device / Other Tubigrip single layer applied. - Size D Elevate legs to the level of the heart and pump ankles as often as possible Elevate leg(s) parallel to the floor when sitting. Other: - PATIENT MUST SLEEP IN A BED. Legs are swelling due to no elevation of legs. 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 ntibiotics - RX renewed 05/01/2023 P.O. 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 Remove old dressing, discard into plastic bag and place into trash. Cleanse the wound with Wound Cleanser prior to applying a clean dressing using gauze sponges, not tissues or cotton balls. Do not scrub or use excessive force. Pat dry using gauze sponges, not tissue or cotton balls. Prim Dressing: Silvercel Small 2x2 (in/in) 1 x Per Day/30 Days ary Discharge Instructions: Apply Silvercel Small 2x2 (in/in) as instructed Secondary Dressing: Zetuvit Plus 4x8 (in/in) 1 x Per Day/30 Days Secondary Dressing: Heel Cup 1 x Per Day/30 Days Secured With: Kerlix Roll Sterile or Non-Sterile 6-ply 4.5x4 (yd/yd) 1 x Per Day/30 Days Discharge Instructions: Apply Kerlix as directed Secured With: Tubigrip Size D, 3x10 (  Chad [name], Chad [name] ([id number]) [id number] Page 1 of 16 Visit Report for 05/18/2023 Chief Complaint Document Details Patient Name: Date of Service: Chad [name], Chad [name] 05/18/2023 3:30 PM Medical Record Number: [id number] Patient Account Number: [id number] Date of Birth/Sex: Treating RN: 03/12/1961 (62 y.o. [name] Primary Care Provider: [name] Other Clinician: Referring Provider: Treating Provider/Extender: [name] Weeks in Treatment: 101 Information Obtained from: Patient Chief Complaint Left ankle ulcer, Left dorsal foot wound, right heel Electronic Signature(s) Signed: 05/18/2023 3:18:54 PM By: [name] PA-C Entered By: [name] on 05/18/2023 12:18:54 -------------------------------------------------------------------------------- HPI Details Patient Name: Date of Service: Chad [name] 05/18/2023 3:30 PM Medical Record Number: [id number] Patient Account Number: [id number] Date of Birth/Sex: Treating RN: 1960/08/07 (62 y.o. [name] Primary Care Provider: [name] Other Clinician: Referring Provider: Treating Provider/Extender: [name] Weeks in Treatment: 17 History of Present Illness HPI Description: 10/08/18 on evaluation today patient actually presents to our office for initial evaluation concerning wounds that he has of the bilateral lower extremities. 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 He also has bilateral venous stasis. He apparently has been homeless currently following discharge from the hospital apparently he has been placed at [facility] which is is a skilled nursing facility locally. Nonetheless fortunately he does not show any signs of infection at this time which is good news. In fact several of the wound  actually appears to be showing some signs of improvement already in my pinion. There are a couple areas in the left leg in particular there likely gonna require some sharp debridement to help clear away some necrotic tissue and help with more sufficient healing. No fevers, chills, nausea, or vomiting noted at this time. 10/15/18 on evaluation today patient actually appears to be doing very well in regard to his bilateral lower extremities. He's been tolerating the dressing changes without complication. Fortunately there does not appear to be any evidence of active infection at this time which is great news. Overall I'm actually very pleased with how this has progressed in just one visits time. Readmission: 08/14/2020 upon evaluation today patient presents for re-evaluation here in our clinic. He is having issues with his left ankle region as well as his right toe and his right heel. He tells me that the toe and heel actually began as a area that was itching that he was scratching and then subsequently opened up into wounds. These may have been abscess areas I presume based on what I am seeing currently. With regard to his left ankle region he tells me this was a similar type Chad [name], Chad [name] ([id number]) [id number] Page 2 of 16 occurrence although he does have venous stasis this very well may be more of a venous leg ulcer more than anything. Nonetheless I do believe that the patient would benefit from appropriate and aggressive wound care to try to help get things under better control here. He does have history of a stroke on the left side affecting him to some degree there that he is able to stand although he does have some residual weakness. Otherwise again the patient does have chronic venous insufficiency as previously noted. His arterial studies most recently obtained showed that he had an ABI on the right of 1.16 with a TBI of 0.52 and on the left and ABI of 1.14 with a  gauze sponges, not tissue or cotton balls. Prim Dressing: Silvercel Small 2x2 (in/in) 1 x Per Day/30 Days ary Discharge Instructions: Apply Silvercel Small 2x2 (in/in) as instructed Secondary Dressing: Zetuvit Plus 4x8 (in/in) 1 x Per Day/30 Days Secondary Dressing: Heel Cup 1 x Per Day/30 Days Secured With: Kerlix Roll Sterile or Non-Sterile 6-ply 4.5x4 (yd/yd) 1 x Per Day/30 Days Discharge Instructions: Apply Kerlix as directed Secured With: Tubigrip Size D, 3x10 (in/yd) 1 x Per Day/30 Days Electronic Signature(s) Signed: 05/18/2023 4:50:43 PM By: [name] PA-C Signed: 05/18/2023 5:06:23 PM By: Midge Aver MSN RN CNS WTA Entered By: Midge Aver on 05/18/2023 13:35:15 [name] ([id number]) [id number] Page 9 of 16 -------------------------------------------------------------------------------- Problem List Details Patient Name: Date of  Service: Chad [name], Chad [name] 05/18/2023 3:30 PM Medical Record Number: [id number] Patient Account Number: [id number] Date of Birth/Sex: Treating RN: 1961-06-11 (62 y.o. 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 02/19/2023 No Yes foot 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, stage 3 10/07/2022 No Yes Inactive Problems ICD-10 Code Description Active Date Inactive Date L97.322 Non-pressure chronic ulcer of left ankle with fat layer exposed 12/06/2021 12/06/2021 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 [name] Primary Care Provider: [name] Other Clinician: Referring Provider: Treating Provider/Extender: [name] Weeks in Treatment: 86 Subjective Chief Complaint Information  obtained from Patient Left ankle ulcer, Left dorsal foot wound, right heel History of Present Illness (HPI) 10/08/18 on evaluation today patient actually presents to our office for initial evaluation concerning wounds that he has of the bilateral lower extremities. 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 He also has bilateral venous stasis. He apparently has been homeless currently following discharge from the hospital apparently he has been placed at [facility] which is is a skilled nursing facility locally. Nonetheless fortunately he does not show any signs of infection at this time which is good news. In fact several of the wound actually appears to be showing some signs of improvement already in my pinion. There are a couple areas in the left leg in particular there likely gonna require some sharp debridement to help clear away some necrotic tissue and help with more sufficient healing. No fevers, chills, nausea, or vomiting noted at this time. 10/15/18 on evaluation today patient actually appears to be doing very well in regard to his bilateral lower extremities. He's been tolerating the dressing changes without complication. Fortunately there does not appear to be any evidence of active  Chad [name], Chad [name] ([id number]) [id number] Page 1 of 16 Visit Report for 05/18/2023 Chief Complaint Document Details Patient Name: Date of Service: Chad [name], Chad [name] 05/18/2023 3:30 PM Medical Record Number: [id number] Patient Account Number: [id number] Date of Birth/Sex: Treating RN: 03/12/1961 (62 y.o. [name] Primary Care Provider: [name] Other Clinician: Referring Provider: Treating Provider/Extender: [name] Weeks in Treatment: 101 Information Obtained from: Patient Chief Complaint Left ankle ulcer, Left dorsal foot wound, right heel Electronic Signature(s) Signed: 05/18/2023 3:18:54 PM By: [name] PA-C Entered By: [name] on 05/18/2023 12:18:54 -------------------------------------------------------------------------------- HPI Details Patient Name: Date of Service: Chad [name] 05/18/2023 3:30 PM Medical Record Number: [id number] Patient Account Number: [id number] Date of Birth/Sex: Treating RN: 1960/08/07 (62 y.o. [name] Primary Care Provider: [name] Other Clinician: Referring Provider: Treating Provider/Extender: [name] Weeks in Treatment: 17 History of Present Illness HPI Description: 10/08/18 on evaluation today patient actually presents to our office for initial evaluation concerning wounds that he has of the bilateral lower extremities. 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 He also has bilateral venous stasis. He apparently has been homeless currently following discharge from the hospital apparently he has been placed at [facility] which is is a skilled nursing facility locally. Nonetheless fortunately he does not show any signs of infection at this time which is good news. In fact several of the wound  actually appears to be showing some signs of improvement already in my pinion. There are a couple areas in the left leg in particular there likely gonna require some sharp debridement to help clear away some necrotic tissue and help with more sufficient healing. No fevers, chills, nausea, or vomiting noted at this time. 10/15/18 on evaluation today patient actually appears to be doing very well in regard to his bilateral lower extremities. He's been tolerating the dressing changes without complication. Fortunately there does not appear to be any evidence of active infection at this time which is great news. Overall I'm actually very pleased with how this has progressed in just one visits time. Readmission: 08/14/2020 upon evaluation today patient presents for re-evaluation here in our clinic. He is having issues with his left ankle region as well as his right toe and his right heel. He tells me that the toe and heel actually began as a area that was itching that he was scratching and then subsequently opened up into wounds. These may have been abscess areas I presume based on what I am seeing currently. With regard to his left ankle region he tells me this was a similar type Chad [name], Chad [name] ([id number]) [id number] Page 2 of 16 occurrence although he does have venous stasis this very well may be more of a venous leg ulcer more than anything. Nonetheless I do believe that the patient would benefit from appropriate and aggressive wound care to try to help get things under better control here. He does have history of a stroke on the left side affecting him to some degree there that he is able to stand although he does have some residual weakness. Otherwise again the patient does have chronic venous insufficiency as previously noted. His arterial studies most recently obtained showed that he had an ABI on the right of 1.16 with a TBI of 0.52 and on the left and ABI of 1.14 with a  Chip Boer Primary Care Provider: [name] Other Clinician: Referring Provider: Treating Provider/Extender: [name] Weeks in Treatment: 49 Constitutional Well-nourished and well-hydrated in no acute distress. Respiratory normal breathing without difficulty. Psychiatric this patient is able to make decisions and demonstrates good insight into disease process. Alert and Oriented x 3. pleasant and cooperative. Notes Of note patient's dressings that he had in place today had been on since Thursday actually which is quite a bit longer than what is normal we generally have this changed every day. He is not sure exactly why they did not change them but nonetheless I think that is part of the reason why it looked as wet as it did today. In general I think though he does seem to be making some progress here. This is just very slow. Electronic  Signature(s) Signed: 05/18/2023 4:31:28 PM By: [name] PA-C Entered By: [name] on 05/18/2023 13:31:28 -------------------------------------------------------------------------------- Physician Orders Details Patient Name: Date of Service: Chad [name], Chad [name] 05/18/2023 3:30 PM Medical Record Number: [id number] Patient Account Number: [id number] Date of Birth/Sex: Treating RN: 07/04/1961 (62 y.o. 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 Nurse Visit as needed Bathing/ Shower/ Hygiene Clean wound with Normal Saline or wound cleanser. No tub bath. Anesthetic (Use 'Patient Medications' Section for Anesthetic Order Entry) Lidocaine applied to wound bed Edema Control - Lymphedema / Segmental Compressive Device / Other Tubigrip single layer applied. - Size D Elevate legs to the level of the heart and pump ankles as often as possible Elevate leg(s) parallel to the floor when sitting. Other: - PATIENT MUST SLEEP IN A BED. Legs are swelling due to no elevation of legs. 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 ntibiotics - RX renewed 05/01/2023 P.O. 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 Remove old dressing, discard into plastic bag and place into trash. Cleanse the wound with Wound Cleanser prior to applying a clean dressing using gauze sponges, not tissues or cotton balls. Do not scrub or use excessive force. Pat dry using gauze sponges, not tissue or cotton balls. Prim Dressing: Silvercel Small 2x2 (in/in) 1 x Per Day/30 Days ary Discharge Instructions: Apply Silvercel Small 2x2 (in/in) as instructed Secondary Dressing: Zetuvit Plus 4x8 (in/in) 1 x Per Day/30 Days Secondary Dressing: Heel Cup 1 x Per Day/30 Days Secured With: Kerlix Roll Sterile or Non-Sterile 6-ply 4.5x4 (yd/yd) 1 x Per Day/30 Days Discharge Instructions: Apply Kerlix as directed Secured With: Tubigrip Size D, 3x10 (

## 2023-05-18 NOTE — Progress Notes (Addendum)
Tracing (instead of photographs) []  - 0 Simple Wound Measurement - one wound X- 5 5 Complex Wound Measurement - multiple wounds INTERVENTIONS - Wound Dressings []  - 0 Small Wound Dressing one or multiple wounds X- 2 15 Medium Wound Dressing one or multiple wounds X- 3 20 Large Wound Dressing one or multiple wounds []  - 0 Application of Medications - topical []  - 0 Application of Medications - injection [name] ([id number]) [id number] Page 3 of 15 INTERVENTIONS - Miscellaneous []  - 0 External ear exam []  - 0 Specimen Collection (cultures, biopsies, blood, body fluids, etc.) []  - 0 Specimen(s) / Culture(s) sent or taken to Lab for analysis []  - 0 Patient Transfer (multiple staff / Michiel Sites Lift / Similar devices) []  - 0 Simple Staple / Suture removal (25 or less) []  - 0 Complex Staple / Suture removal (26 or more) []  - 0 Hypo / Hyperglycemic Management (close monitor of Blood Glucose) []  - 0 Ankle / Brachial Index (ABI) - do not check if billed separately X- 1 5 Vital Signs Has the patient been seen at the hospital within the last three years: Yes Total Score: 235 Level Of Care: New/Established - Level 5 Electronic Signature(s) Signed: 05/18/2023 5:06:23 PM By: Midge Aver MSN RN CNS WTA Entered By: Midge Aver on 05/18/2023 13:36:08 -------------------------------------------------------------------------------- Encounter Discharge Information Details Patient Name: Date of Service: [name] 05/18/2023 3:30 PM Medical Record Number: [id number] Patient Account Number: [id number] Date of Birth/Sex: Treating RN: 07/04/1961 (62 y.o. [name] Primary Care Hanad Leino: [name] Other  Clinician: Referring Lylee Corrow: Treating Tashonna Descoteaux/Extender: [name] in Treatment: 6 Encounter Discharge Information Items Discharge Condition: Stable Ambulatory Status: Wheelchair Discharge Destination: Skilled Nursing Facility Telephoned: No Orders Sent: Yes Transportation: Other Accompanied By: self Schedule Follow-up Appointment: Yes Clinical Summary of Care: Electronic Signature(s) Signed: 05/18/2023 5:06:23 PM By: Midge Aver MSN RN CNS WTA Entered By: Midge Aver on 05/18/2023 13:37:34 [name] ([id number]) [id number] Page 4 of 15 -------------------------------------------------------------------------------- Lower Extremity Assessment Details Patient Name: Date of Service: KRYSTLE, MAHESHWARI 05/18/2023 3:30 PM Medical Record Number: [id number] Patient Account Number: [id number] Date of Birth/Sex: Treating RN: 1961/02/02 (62 y.o. [name] Primary Care Shante Archambeault: [name] Other Clinician: Referring Kyion Gautier: Treating Zeddie Njie/Extender: [name] Weeks in Treatment: 75 Edema Assessment Assessed: [Left: No] [Right: No] [Left: Edema] [Right: :] Calf Left: Right: Point of Measurement: 33 cm From Medial Instep 40.5 cm Ankle Left: Right: Point of Measurement: 12 cm From Medial Instep 24 cm Vascular Assessment Pulses: Dorsalis Pedis Palpable: [Right:Yes] Extremity colors, hair growth, and conditions: Extremity Color: [Right:Red] Hair Growth on Extremity: [Right:No] Temperature of Extremity: [Right:Warm] Capillary Refill: [Right:< 3 seconds] Dependent Rubor: [Right:No] Blanched when Elevated: [Right:No No] Toe Nail Assessment Left: Right: Thick: Yes Discolored: Yes Deformed: Yes Improper Length and Hygiene: Yes Electronic Signature(s) Signed: 05/18/2023 5:06:23 PM By: Midge Aver MSN RN CNS WTA Entered By: Midge Aver on 05/18/2023 13:12:25 [name] ([id number])  [id number] Page 5 of 15 -------------------------------------------------------------------------------- Multi Wound Chart Details Patient Name: Date of Service: KYSIN, CUBITT 05/18/2023 3:30 PM Medical Record Number: [id number] Patient Account Number: [id number] Date of Birth/Sex: Treating RN: Mar 04, 1961 (62 y.o. [name] Primary Care Kwesi Sangha: [name] Other Clinician: Referring Camilo Mander: Treating Tydarius Yawn/Extender: [name] Weeks in Treatment: 57 Vital Signs Height(in): 69 Pulse(bpm): 85 Weight(lbs): 150 Blood Pressure(mmHg): 110/70 Body Mass Index(BMI): 22.1 Temperature(F): 98.3 Respiratory Rate(breaths/min): 18 [16:Photos:] Right Calcaneus Right, Medial T Second oe Right, Dorsal Foot Wound  Granulation Quality: Red, Pink Fascia Exposed: No Necrotic Amount: Small (1-33%) Fat Layer (Subcutaneous Tissue) Exposed: Yes Necrotic Quality: Adherent Slough Tendon Exposed: No Muscle Exposed: No Joint Exposed: No Bone Exposed: No Treatment Notes Wound #21 (Foot) Wound Laterality: Dorsal, Right Cleanser Soap and Water Discharge Instruction: Gently cleanse wound with antibacterial soap, rinse and pat dry prior to dressing wounds Wound Cleanser Discharge Instruction: Wash your hands with soap and water. Remove old dressing, discard into plastic bag and place into trash. Cleanse the wound with Wound Cleanser prior to applying a clean dressing using gauze sponges, not  tissues or cotton balls. Do not scrub or use excessive force. Pat dry using gauze sponges, not tissue or cotton balls. Peri-Wound Care Topical Primary Dressing Silvercel Small 2x2 (in/in) Discharge Instruction: Apply Silvercel Small 2x2 (in/in) as instructed Secondary Dressing Zetuvit Plus 4x8 (in/in) Secured With [employer] Sterile or Non-Sterile 6-ply 4.5x4 (yd/yd) Discharge Instruction: Apply Kerlix as directed Tubigrip Size D, 3x10 (in/yd) Compression Wrap Compression Stockings Add-Ons Electronic Signature(s) Signed: 05/18/2023 5:06:23 PM By: Midge Aver MSN RN CNS WTA Entered By: Midge Aver on 05/18/2023 13:11:00 -------------------------------------------------------------------------------- Wound Assessment Details Patient Name: Date of Service: [name] 05/18/2023 3:30 PM Medical Record Number: [id number] Patient Account Number: [id number] Date of Birth/Sex: Treating RN: 06/20/1961 (62 y.o. [name] Primary Care Jvon Meroney: [name] Other Clinician: Referring Gilford Lardizabal: Treating Aerabella Galasso/Extender: [name] Weeks in Treatment: 75 Wound Status Wound Number: 22 Primary Venous Leg Ulcer Etiology: Wound Location: Right, Anterior Lower Leg Wound Open Wounding Event: Pressure Injury Status: Date Acquired: 03/02/2023 Comorbid Anemia, Chronic Obstructive Pulmonary Disease (COPD), Coronary Weeks Of Treatment: 7 History: Artery Disease, Peripheral Arterial Disease, Peripheral Venous Clustered Wound: Yes Disease, Hepatitis C, Osteoarthritis, Neuropathy, Received Chemotherapy FARREL, GIESEL ([id number]) [id number] Page 13 of 15 Photos Wound Measurements Length: (cm) 10.5 Width: (cm) 10 Depth: (cm) 0.1 Area: (cm) 82.467 Volume: (cm) 8.247 % Reduction in Area: -3400.3% % Reduction in Volume: -1651% Epithelialization: Small (1-33%) Wound Description Classification: Full Thickness Without Exposed Support  Structures Exudate Amount: Medium Exudate Type: Serous Exudate Color: amber Foul Odor After Cleansing: No Slough/Fibrino Yes Wound Bed Granulation Amount: Medium (34-66%) Exposed Structure Granulation Quality: Red, Pink Fascia Exposed: No Necrotic Amount: Small (1-33%) Fat Layer (Subcutaneous Tissue) Exposed: Yes Necrotic Quality: Adherent Slough Tendon Exposed: No Muscle Exposed: No Joint Exposed: No Bone Exposed: No Treatment Notes Wound #22 (Lower Leg) Wound Laterality: Right, Anterior Cleanser Soap and Water Discharge Instruction: Gently cleanse wound with antibacterial soap, rinse and pat dry prior to dressing wounds Wound Cleanser Discharge Instruction: Wash your hands with soap and water. Remove old dressing, discard into plastic bag and place into trash. Cleanse the wound with Wound Cleanser prior to applying a clean dressing using gauze sponges, not tissues or cotton balls. Do not scrub or use excessive force. Pat dry using gauze sponges, not tissue or cotton balls. Peri-Wound Care Topical Primary Dressing Silvercel Small 2x2 (in/in) Discharge Instruction: Apply Silvercel Small 2x2 (in/in) as instructed Secondary Dressing Zetuvit Plus 4x8 (in/in) Secured With [employer] Sterile or Non-Sterile 6-ply 4.5x4 (yd/yd) Discharge Instruction: Apply Kerlix as directed Tubigrip Size D, 3x10 (in/yd) Compression Wrap Compression Stockings Add-Ons Electronic Signature(s) Signed: 05/18/2023 5:06:23 PM By: Midge Aver MSN RN CNS WTA Entered By: Midge Aver on 05/18/2023 13:11:22 [name] ([id number]) [id number] Page 14 of 15 -------------------------------------------------------------------------------- Wound Assessment Details Patient Name: Date of Service: AERION, VOLKER 05/18/2023 3:30 PM Medical Record Number: [id number] Patient Account Number: [id number] Date of Birth/Sex:  CONALL, HUFFMAN ([id number]) [id number] Page 1 of 15 Visit Report for 05/18/2023 Arrival Information Details Patient Name: Date of Service: MANDEL, COTUGNO 05/18/2023 3:30 PM Medical Record Number: [id number] Patient Account Number: [id number] Date of Birth/Sex: Treating RN: [birth year] (62 y.o. [name] Primary Care Symphanie Cederberg: [name] Other Clinician: Referring Cutberto Winfree: Treating Tatsuo Musial/Extender: [name] in Treatment: 91 Visit Information History Since Last Visit Added or deleted any medications: No Patient Arrived: Wheel Chair Any new allergies or adverse reactions: No Arrival Time: 15:41 Has Dressing in Place as Prescribed: Yes Accompanied By: self Pain Present Now: No Transfer Assistance: EasyPivot Patient Lift Patient Identification Verified: Yes Secondary Verification Process Completed: Yes Patient Requires Transmission-Based No Precautions: Patient Has Alerts: Yes Patient Alerts: Patient on Blood Thinner NOT diabetic aspirin 81mg  Lives [facility] SN[facility] ABI R 0.81 11/05/22 ABI L 0.59 11/05/22 [facility] 03/03/23 Electronic Signature(s) Signed: 05/18/2023 5:06:23 PM By: Midge Aver MSN RN CNS WTA Entered By: Midge Aver on 05/18/2023 13:08:01 -------------------------------------------------------------------------------- Clinic Level of Care Assessment Details Patient Name: Date of Service: CORVIN, BOGUS 05/18/2023 3:30 PM Medical Record Number: [id number] Patient Account Number: [id number] Date of Birth/Sex: Treating RN: 12/27/1960 (62 y.o. [name] Primary Care Essie Gehret: [name] Other Clinician: Referring Ionia Schey: Treating Akshitha Culmer/Extender: [name] Weeks in Treatment: 31 Clinic Level of Care Assessment Items TOOL 4 Quantity Score X- 1 0 Use when only an EandM is performed on FOLLOW-UP visit ASSESSMENTS - Nursing Assessment / Reassessment KAICEN, MCCUNE  ([id number]) [id number] Page 2 of 15 X- 1 10 Reassessment of Co-morbidities (includes updates in patient status) X- 1 5 Reassessment of Adherence to Treatment Plan ASSESSMENTS - Wound and Skin A ssessment / Reassessment []  - 0 Simple Wound Assessment / Reassessment - one wound X- 5 5 Complex Wound Assessment / Reassessment - multiple wounds []  - 0 Dermatologic / Skin Assessment (not related to wound area) ASSESSMENTS - Focused Assessment []  - 0 Circumferential Edema Measurements - multi extremities []  - 0 Nutritional Assessment / Counseling / Intervention []  - 0 Lower Extremity Assessment (monofilament, tuning fork, pulses) []  - 0 Peripheral Arterial Disease Assessment (using hand held doppler) ASSESSMENTS - Ostomy and/or Continence Assessment and Care []  - 0 Incontinence Assessment and Management []  - 0 Ostomy Care Assessment and Management (repouching, etc.) PROCESS - Coordination of Care []  - 0 Simple Patient / Family Education for ongoing care X- 1 20 Complex (extensive) Patient / Family Education for ongoing care X- 1 10 Staff obtains Chiropractor, Records, T Results / Process Orders est []  - 0 Staff telephones HHA, Nursing Homes / Clarify orders / etc []  - 0 Routine Transfer to another Facility (non-emergent condition) []  - 0 Routine Hospital Admission (non-emergent condition) []  - 0 New Admissions / Manufacturing engineer / Ordering NPWT Apligraf, etc. , []  - 0 Emergency Hospital Admission (emergent condition) []  - 0 Simple Discharge Coordination X- 1 15 Complex (extensive) Discharge Coordination PROCESS - Special Needs []  - 0 Pediatric / Minor Patient Management []  - 0 Isolation Patient Management []  - 0 Hearing / Language / Visual special needs []  - 0 Assessment of Community assistance (transportation, D/C planning, etc.) []  - 0 Additional assistance / Altered mentation []  - 0 Support Surface(s) Assessment (bed, cushion,  seat, etc.) INTERVENTIONS - Wound Cleansing / Measurement []  - 0 Simple Wound Cleansing - one wound X- 5 5 Complex Wound Cleansing - multiple wounds X- 1 5 Wound Imaging (photographs - any number of wounds) []  - 0 Wound  CONALL, HUFFMAN ([id number]) [id number] Page 1 of 15 Visit Report for 05/18/2023 Arrival Information Details Patient Name: Date of Service: MANDEL, COTUGNO 05/18/2023 3:30 PM Medical Record Number: [id number] Patient Account Number: [id number] Date of Birth/Sex: Treating RN: [birth year] (62 y.o. [name] Primary Care Symphanie Cederberg: [name] Other Clinician: Referring Cutberto Winfree: Treating Tatsuo Musial/Extender: [name] in Treatment: 91 Visit Information History Since Last Visit Added or deleted any medications: No Patient Arrived: Wheel Chair Any new allergies or adverse reactions: No Arrival Time: 15:41 Has Dressing in Place as Prescribed: Yes Accompanied By: self Pain Present Now: No Transfer Assistance: EasyPivot Patient Lift Patient Identification Verified: Yes Secondary Verification Process Completed: Yes Patient Requires Transmission-Based No Precautions: Patient Has Alerts: Yes Patient Alerts: Patient on Blood Thinner NOT diabetic aspirin 81mg  Lives [facility] SN[facility] ABI R 0.81 11/05/22 ABI L 0.59 11/05/22 [facility] 03/03/23 Electronic Signature(s) Signed: 05/18/2023 5:06:23 PM By: Midge Aver MSN RN CNS WTA Entered By: Midge Aver on 05/18/2023 13:08:01 -------------------------------------------------------------------------------- Clinic Level of Care Assessment Details Patient Name: Date of Service: CORVIN, BOGUS 05/18/2023 3:30 PM Medical Record Number: [id number] Patient Account Number: [id number] Date of Birth/Sex: Treating RN: 12/27/1960 (62 y.o. [name] Primary Care Essie Gehret: [name] Other Clinician: Referring Ionia Schey: Treating Akshitha Culmer/Extender: [name] Weeks in Treatment: 31 Clinic Level of Care Assessment Items TOOL 4 Quantity Score X- 1 0 Use when only an EandM is performed on FOLLOW-UP visit ASSESSMENTS - Nursing Assessment / Reassessment KAICEN, MCCUNE  ([id number]) [id number] Page 2 of 15 X- 1 10 Reassessment of Co-morbidities (includes updates in patient status) X- 1 5 Reassessment of Adherence to Treatment Plan ASSESSMENTS - Wound and Skin A ssessment / Reassessment []  - 0 Simple Wound Assessment / Reassessment - one wound X- 5 5 Complex Wound Assessment / Reassessment - multiple wounds []  - 0 Dermatologic / Skin Assessment (not related to wound area) ASSESSMENTS - Focused Assessment []  - 0 Circumferential Edema Measurements - multi extremities []  - 0 Nutritional Assessment / Counseling / Intervention []  - 0 Lower Extremity Assessment (monofilament, tuning fork, pulses) []  - 0 Peripheral Arterial Disease Assessment (using hand held doppler) ASSESSMENTS - Ostomy and/or Continence Assessment and Care []  - 0 Incontinence Assessment and Management []  - 0 Ostomy Care Assessment and Management (repouching, etc.) PROCESS - Coordination of Care []  - 0 Simple Patient / Family Education for ongoing care X- 1 20 Complex (extensive) Patient / Family Education for ongoing care X- 1 10 Staff obtains Chiropractor, Records, T Results / Process Orders est []  - 0 Staff telephones HHA, Nursing Homes / Clarify orders / etc []  - 0 Routine Transfer to another Facility (non-emergent condition) []  - 0 Routine Hospital Admission (non-emergent condition) []  - 0 New Admissions / Manufacturing engineer / Ordering NPWT Apligraf, etc. , []  - 0 Emergency Hospital Admission (emergent condition) []  - 0 Simple Discharge Coordination X- 1 15 Complex (extensive) Discharge Coordination PROCESS - Special Needs []  - 0 Pediatric / Minor Patient Management []  - 0 Isolation Patient Management []  - 0 Hearing / Language / Visual special needs []  - 0 Assessment of Community assistance (transportation, D/C planning, etc.) []  - 0 Additional assistance / Altered mentation []  - 0 Support Surface(s) Assessment (bed, cushion,  seat, etc.) INTERVENTIONS - Wound Cleansing / Measurement []  - 0 Simple Wound Cleansing - one wound X- 5 5 Complex Wound Cleansing - multiple wounds X- 1 5 Wound Imaging (photographs - any number of wounds) []  - 0 Wound  or Non-Sterile 6-ply 4.5x4 (yd/yd) Discharge Instruction: Apply Kerlix as directed Tubigrip Size D, 3x10 (in/yd) Compression Wrap Compression Stockings Add-Ons Electronic Signature(s) Signed: 05/18/2023 5:06:23 PM By: Midge Aver MSN RN CNS WTA Entered By: Midge Aver on 05/18/2023 13:10:10 -------------------------------------------------------------------------------- Wound Assessment Details Patient Name: Date of Service: [name] 05/18/2023 3:30 PM [name] ([id number]) [id number] Page 10 of 15 Medical Record Number: [id number] Patient Account Number: [id number] Date of Birth/Sex: Treating RN: [birth year] (62 y.o. [name] Primary Care Staisha Winiarski: [name] Other Clinician: Referring Olanrewaju Osborn: Treating Viyan Rosamond/Extender: [name] Weeks in Treatment: 75 Wound Status Wound Number: 18 Primary Pressure Ulcer Etiology: Wound Location: Right, Medial T Second oe Wound Open Wounding Event: Pressure Injury Status: Date Acquired: 09/26/2022 Comorbid Anemia, Chronic Obstructive Pulmonary Disease (COPD), Coronary Weeks Of Treatment: 30 History: Artery Disease, Peripheral Arterial Disease, Peripheral Venous Clustered Wound: No Disease, Hepatitis C, Osteoarthritis, Neuropathy, Received Chemotherapy Photos Wound Measurements Length: (cm) 2.7 Width: (cm) 2.4 Depth: (cm) 0.1 Area: (cm) 5.089 Volume: (cm) 0.509 % Reduction in Area: -825.3% % Reduction in Volume:  -825.5% Epithelialization: None Wound Description Classification: Category/Stage III Exudate Amount: Medium Exudate Type: Serosanguineous Exudate Color: red, brown Foul Odor After Cleansing: No Slough/Fibrino Yes Wound Bed Granulation Amount: Small (1-33%) Exposed Structure Granulation Quality: Red, Pink Fascia Exposed: No Necrotic Amount: Medium (34-66%) Fat Layer (Subcutaneous Tissue) Exposed: Yes Necrotic Quality: Adherent Slough Tendon Exposed: No Muscle Exposed: No Joint Exposed: No Bone Exposed: No Treatment Notes Wound #18 (Toe Second) Wound Laterality: Right, Medial Cleanser Soap and Water Discharge Instruction: Gently cleanse wound with antibacterial soap, rinse and pat dry prior to dressing wounds Wound Cleanser Discharge Instruction: Wash your hands with soap and water. Remove old dressing, discard into plastic bag and place into trash. Cleanse the wound with Wound Cleanser prior to applying a clean dressing using gauze sponges, not tissues or cotton balls. Do not scrub or use excessive force. Pat dry using gauze sponges, not tissue or cotton balls. Peri-Wound Care Topical Primary Dressing Silvercel Small 2x2 (in/in) Discharge Instruction: Apply Silvercel Small 2x2 (in/in) as instructed Secondary Dressing Zetuvit Plus 4x8 (in/in) ELBY, STRADFORD ([id number]) [id number] Page 11 of 15 Secured With [employer] or Non-Sterile 6-ply 4.5x4 (yd/yd) Discharge Instruction: Apply Kerlix as directed Tubigrip Size D, 3x10 (in/yd) Compression Wrap Compression Stockings Add-Ons Electronic Signature(s) Signed: 05/18/2023 5:06:23 PM By: Midge Aver MSN RN CNS WTA Entered By: Midge Aver on 05/18/2023 13:10:35 -------------------------------------------------------------------------------- Wound Assessment Details Patient Name: Date of Service: JAMARIEN, MUDDIMAN 05/18/2023 3:30 PM Medical Record Number: [id number] Patient Account  Number: [id number] Date of Birth/Sex: Treating RN: [birth year] (62 y.o. [name] Primary Care Donnita Farina: [name] Other Clinician: Referring Davidjames Blansett: Treating Yen Wandell/Extender: [name] Weeks in Treatment: 75 Wound Status Wound Number: 21 Primary Venous Leg Ulcer Etiology: Wound Location: Right, Dorsal Foot Wound Open Wounding Event: Pressure Injury Status: Date Acquired: 03/02/2023 Comorbid Anemia, Chronic Obstructive Pulmonary Disease (COPD), Coronary Weeks Of Treatment: 7 History: Artery Disease, Peripheral Arterial Disease, Peripheral Venous Clustered Wound: Yes Disease, Hepatitis C, Osteoarthritis, Neuropathy, Received Chemotherapy Photos Wound Measurements Length: (cm) 3.8 Width: (cm) 5 Depth: (cm) 0.2 Area: (cm) 14.923 Volume: (cm) 2.985 % Reduction in Area: -744.5% % Reduction in Volume: -1586.4% Epithelialization: Medium (34-66%) Wound Description Classification: Full Thickness Without Exposed Support Exudate Amount: Medium Exudate Type: Serous Exudate Color: amber Structures Foul Odor After Cleansing: No Slough/Fibrino Yes Wound Bed Kilroy, [name] ([id number]) [id number] Page 12 of 15 Granulation Amount: Small (1-33%) Exposed Structure  Tracing (instead of photographs) []  - 0 Simple Wound Measurement - one wound X- 5 5 Complex Wound Measurement - multiple wounds INTERVENTIONS - Wound Dressings []  - 0 Small Wound Dressing one or multiple wounds X- 2 15 Medium Wound Dressing one or multiple wounds X- 3 20 Large Wound Dressing one or multiple wounds []  - 0 Application of Medications - topical []  - 0 Application of Medications - injection [name] ([id number]) [id number] Page 3 of 15 INTERVENTIONS - Miscellaneous []  - 0 External ear exam []  - 0 Specimen Collection (cultures, biopsies, blood, body fluids, etc.) []  - 0 Specimen(s) / Culture(s) sent or taken to Lab for analysis []  - 0 Patient Transfer (multiple staff / Michiel Sites Lift / Similar devices) []  - 0 Simple Staple / Suture removal (25 or less) []  - 0 Complex Staple / Suture removal (26 or more) []  - 0 Hypo / Hyperglycemic Management (close monitor of Blood Glucose) []  - 0 Ankle / Brachial Index (ABI) - do not check if billed separately X- 1 5 Vital Signs Has the patient been seen at the hospital within the last three years: Yes Total Score: 235 Level Of Care: New/Established - Level 5 Electronic Signature(s) Signed: 05/18/2023 5:06:23 PM By: Midge Aver MSN RN CNS WTA Entered By: Midge Aver on 05/18/2023 13:36:08 -------------------------------------------------------------------------------- Encounter Discharge Information Details Patient Name: Date of Service: [name] 05/18/2023 3:30 PM Medical Record Number: [id number] Patient Account Number: [id number] Date of Birth/Sex: Treating RN: 07/04/1961 (62 y.o. [name] Primary Care Hanad Leino: [name] Other  Clinician: Referring Lylee Corrow: Treating Tashonna Descoteaux/Extender: [name] in Treatment: 6 Encounter Discharge Information Items Discharge Condition: Stable Ambulatory Status: Wheelchair Discharge Destination: Skilled Nursing Facility Telephoned: No Orders Sent: Yes Transportation: Other Accompanied By: self Schedule Follow-up Appointment: Yes Clinical Summary of Care: Electronic Signature(s) Signed: 05/18/2023 5:06:23 PM By: Midge Aver MSN RN CNS WTA Entered By: Midge Aver on 05/18/2023 13:37:34 [name] ([id number]) [id number] Page 4 of 15 -------------------------------------------------------------------------------- Lower Extremity Assessment Details Patient Name: Date of Service: KRYSTLE, MAHESHWARI 05/18/2023 3:30 PM Medical Record Number: [id number] Patient Account Number: [id number] Date of Birth/Sex: Treating RN: 1961/02/02 (62 y.o. [name] Primary Care Shante Archambeault: [name] Other Clinician: Referring Kyion Gautier: Treating Zeddie Njie/Extender: [name] Weeks in Treatment: 75 Edema Assessment Assessed: [Left: No] [Right: No] [Left: Edema] [Right: :] Calf Left: Right: Point of Measurement: 33 cm From Medial Instep 40.5 cm Ankle Left: Right: Point of Measurement: 12 cm From Medial Instep 24 cm Vascular Assessment Pulses: Dorsalis Pedis Palpable: [Right:Yes] Extremity colors, hair growth, and conditions: Extremity Color: [Right:Red] Hair Growth on Extremity: [Right:No] Temperature of Extremity: [Right:Warm] Capillary Refill: [Right:< 3 seconds] Dependent Rubor: [Right:No] Blanched when Elevated: [Right:No No] Toe Nail Assessment Left: Right: Thick: Yes Discolored: Yes Deformed: Yes Improper Length and Hygiene: Yes Electronic Signature(s) Signed: 05/18/2023 5:06:23 PM By: Midge Aver MSN RN CNS WTA Entered By: Midge Aver on 05/18/2023 13:12:25 [name] ([id number])  [id number] Page 5 of 15 -------------------------------------------------------------------------------- Multi Wound Chart Details Patient Name: Date of Service: KYSIN, CUBITT 05/18/2023 3:30 PM Medical Record Number: [id number] Patient Account Number: [id number] Date of Birth/Sex: Treating RN: Mar 04, 1961 (62 y.o. [name] Primary Care Kwesi Sangha: [name] Other Clinician: Referring Camilo Mander: Treating Tydarius Yawn/Extender: [name] Weeks in Treatment: 57 Vital Signs Height(in): 69 Pulse(bpm): 85 Weight(lbs): 150 Blood Pressure(mmHg): 110/70 Body Mass Index(BMI): 22.1 Temperature(F): 98.3 Respiratory Rate(breaths/min): 18 [16:Photos:] Right Calcaneus Right, Medial T Second oe Right, Dorsal Foot Wound  Granulation Quality: Red, Pink Fascia Exposed: No Necrotic Amount: Small (1-33%) Fat Layer (Subcutaneous Tissue) Exposed: Yes Necrotic Quality: Adherent Slough Tendon Exposed: No Muscle Exposed: No Joint Exposed: No Bone Exposed: No Treatment Notes Wound #21 (Foot) Wound Laterality: Dorsal, Right Cleanser Soap and Water Discharge Instruction: Gently cleanse wound with antibacterial soap, rinse and pat dry prior to dressing wounds Wound Cleanser Discharge Instruction: Wash your hands with soap and water. Remove old dressing, discard into plastic bag and place into trash. Cleanse the wound with Wound Cleanser prior to applying a clean dressing using gauze sponges, not  tissues or cotton balls. Do not scrub or use excessive force. Pat dry using gauze sponges, not tissue or cotton balls. Peri-Wound Care Topical Primary Dressing Silvercel Small 2x2 (in/in) Discharge Instruction: Apply Silvercel Small 2x2 (in/in) as instructed Secondary Dressing Zetuvit Plus 4x8 (in/in) Secured With [employer] Sterile or Non-Sterile 6-ply 4.5x4 (yd/yd) Discharge Instruction: Apply Kerlix as directed Tubigrip Size D, 3x10 (in/yd) Compression Wrap Compression Stockings Add-Ons Electronic Signature(s) Signed: 05/18/2023 5:06:23 PM By: Midge Aver MSN RN CNS WTA Entered By: Midge Aver on 05/18/2023 13:11:00 -------------------------------------------------------------------------------- Wound Assessment Details Patient Name: Date of Service: [name] 05/18/2023 3:30 PM Medical Record Number: [id number] Patient Account Number: [id number] Date of Birth/Sex: Treating RN: 06/20/1961 (62 y.o. [name] Primary Care Jvon Meroney: [name] Other Clinician: Referring Gilford Lardizabal: Treating Aerabella Galasso/Extender: [name] Weeks in Treatment: 75 Wound Status Wound Number: 22 Primary Venous Leg Ulcer Etiology: Wound Location: Right, Anterior Lower Leg Wound Open Wounding Event: Pressure Injury Status: Date Acquired: 03/02/2023 Comorbid Anemia, Chronic Obstructive Pulmonary Disease (COPD), Coronary Weeks Of Treatment: 7 History: Artery Disease, Peripheral Arterial Disease, Peripheral Venous Clustered Wound: Yes Disease, Hepatitis C, Osteoarthritis, Neuropathy, Received Chemotherapy FARREL, GIESEL ([id number]) [id number] Page 13 of 15 Photos Wound Measurements Length: (cm) 10.5 Width: (cm) 10 Depth: (cm) 0.1 Area: (cm) 82.467 Volume: (cm) 8.247 % Reduction in Area: -3400.3% % Reduction in Volume: -1651% Epithelialization: Small (1-33%) Wound Description Classification: Full Thickness Without Exposed Support  Structures Exudate Amount: Medium Exudate Type: Serous Exudate Color: amber Foul Odor After Cleansing: No Slough/Fibrino Yes Wound Bed Granulation Amount: Medium (34-66%) Exposed Structure Granulation Quality: Red, Pink Fascia Exposed: No Necrotic Amount: Small (1-33%) Fat Layer (Subcutaneous Tissue) Exposed: Yes Necrotic Quality: Adherent Slough Tendon Exposed: No Muscle Exposed: No Joint Exposed: No Bone Exposed: No Treatment Notes Wound #22 (Lower Leg) Wound Laterality: Right, Anterior Cleanser Soap and Water Discharge Instruction: Gently cleanse wound with antibacterial soap, rinse and pat dry prior to dressing wounds Wound Cleanser Discharge Instruction: Wash your hands with soap and water. Remove old dressing, discard into plastic bag and place into trash. Cleanse the wound with Wound Cleanser prior to applying a clean dressing using gauze sponges, not tissues or cotton balls. Do not scrub or use excessive force. Pat dry using gauze sponges, not tissue or cotton balls. Peri-Wound Care Topical Primary Dressing Silvercel Small 2x2 (in/in) Discharge Instruction: Apply Silvercel Small 2x2 (in/in) as instructed Secondary Dressing Zetuvit Plus 4x8 (in/in) Secured With [employer] Sterile or Non-Sterile 6-ply 4.5x4 (yd/yd) Discharge Instruction: Apply Kerlix as directed Tubigrip Size D, 3x10 (in/yd) Compression Wrap Compression Stockings Add-Ons Electronic Signature(s) Signed: 05/18/2023 5:06:23 PM By: Midge Aver MSN RN CNS WTA Entered By: Midge Aver on 05/18/2023 13:11:22 [name] ([id number]) [id number] Page 14 of 15 -------------------------------------------------------------------------------- Wound Assessment Details Patient Name: Date of Service: AERION, VOLKER 05/18/2023 3:30 PM Medical Record Number: [id number] Patient Account Number: [id number] Date of Birth/Sex:

## 2023-05-25 ENCOUNTER — Encounter: Payer: Medicaid Other | Admitting: Physician Assistant

## 2023-05-25 DIAGNOSIS — I87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 hypertension (idiopathic) with ulcer and inflammation of left lower extremity: Secondary | ICD-10-CM | POA: Diagnosis not present

## 2023-05-25 NOTE — Progress Notes (Addendum)
0 Wound Tracing (instead of photographs) []  - 0 Simple Wound Measurement - one wound X- 5 5 Complex Wound Measurement - multiple wounds INTERVENTIONS - Wound Dressings []  - 0 Small Wound Dressing one or multiple wounds X- 5 15 Medium Wound Dressing one or multiple wounds []  - 0 Large Wound Dressing one or multiple wounds []  - 0 Application of Medications - topical []  - 0 Application of Medications - injection Chad [name] ([id number]) [id number] Page 3 of 17 INTERVENTIONS - Miscellaneous []  - 0 External ear exam []  - 0 Specimen Collection (cultures, biopsies, blood, body fluids, etc.) []  - 0 Specimen(s) / Culture(s) sent or taken to Lab for analysis []  - 0 Patient Transfer (multiple staff / Michiel [name] Lift / Similar devices) []  - 0 Simple Staple / Suture removal (25 or less) []  - 0 Complex Staple / Suture removal (26 or more) []  - 0 Hypo / Hyperglycemic Management (close monitor of Blood Glucose) []  - 0 Ankle / Brachial Index (ABI) - do not check if billed separately X- 1 5 Vital Signs Has the patient been seen at the hospital within the last three years: Yes Total Score: 220 Level Of Care: New/Established - Level 5 Electronic Signature(s) Signed: 05/25/2023 4:36:05 PM By: Chad Aver MSN RN CNS WTA Entered By: Chad [name] on 05/25/2023 06:11:39 -------------------------------------------------------------------------------- Encounter Discharge Information Details Patient Name: Date of Service: Chad [name] 05/25/2023 8:30 A M Medical Record Number: [id number] Patient Account Number: [id number] Date of Birth/Sex: Treating RN: Chad [name] (62 y.o. Chad [name] Primary Care Chad [name]: Chad [name] Other  Clinician: Referring Chad [name]: Treating Chad [name]/Extender: Chad [name] in [name]: 87 Encounter Discharge Information Items Discharge Condition: Stable Ambulatory Status: Wheelchair Discharge Destination: Skilled Nursing Facility Telephoned: No Orders Sent: Yes Transportation: Other Accompanied By: self Schedule Follow-up Appointment: Yes Clinical Summary of Care: Electronic Signature(s) Signed: 05/25/2023 11:17:17 AM By: Chad Aver MSN RN CNS WTA Entered By: Chad [name] on 05/25/2023 08:17:16 Chad [name] ([id number]) [id number] Page 4 of 17 -------------------------------------------------------------------------------- Lower Extremity Assessment Details Patient Name: Date of Service: Chad [name], Chad [name] 05/25/2023 8:30 A M Medical Record Number: [id number] Patient Account Number: [id number] Date of Birth/Sex: Treating RN: [birth year][name] (62 y.o. Chad [name] Primary Care Chad [name]: Chad [name] Other Clinician: Referring Chad [name]: Treating Chad [name]/Extender: Chad [name]: 74 Edema Assessment Assessed: [Left: No] [Right: No] [Left: Edema] [Right: :] Calf Left: Right: Point of Measurement: 33 cm From Medial Instep 40.5 cm Ankle Left: Right: Point of Measurement: 12 cm From Medial Instep 24 cm Vascular Assessment Pulses: Dorsalis Pedis Palpable: [Right:Yes] Extremity colors, hair growth, and conditions: Extremity Color: [Right:Red] Hair Growth on Extremity: [Right:No] Temperature of Extremity: [Right:Warm] Capillary Refill: [Right:> 3 seconds] Dependent Rubor: [Right:No] Blanched when Elevated: [Right:No No] Toe Nail Assessment Left: Right: Thick: Yes Discolored: Yes Deformed: Yes Improper Length and Hygiene: Yes Electronic Signature(s) Signed: 05/25/2023 11:15:53 AM By: Chad Aver MSN RN CNS WTA Entered By: Chad [name] on 05/25/2023 08:15:53 Chad [name] ([id number])  [id number] Page 5 of 17 -------------------------------------------------------------------------------- Multi Wound Chart Details Patient Name: Date of Service: Chad [name], Chad [name] 05/25/2023 8:30 A M Medical Record Number: [id number] Patient Account Number: [id number] Date of Birth/Sex: Treating RN: Chad [name] (62 y.o. Chad [name] Primary Care Chad [name]: Chad [name] Other Clinician: Referring Chad [name]: Treating Chad [name]/Extender: Chad [name]: 31 Vital Signs Height(in): 69 Pulse(bpm): 85 Weight(lbs): 150 Blood Pressure(mmHg): 154/92 Body Mass Index(BMI): 22.1 Temperature(F): 97.8 Respiratory Rate(breaths/min): 18 [16:Photos:] Right Calcaneus Right, Medial T Second  Chad [name] ([id number]) [id number] Page 1 of 17 Visit Report for 05/25/2023 Arrival Information Details Patient Name: Date of Service: Chad [name], Chad [name] 05/25/2023 8:30 A M Medical Record Number: [id number] Patient Account Number: [id number] Date of Birth/Sex: Treating RN: [birth year][name] (62 y.o. Chad [name] Primary Care Chad [name]: Chad [name] Other Clinician: Referring Chad [name]: Treating Chad [name]: Chad [name] in [name]: 50 Visit Information History Since Last Visit Added or deleted any medications: No Patient Arrived: Wheel Chair Any new allergies or adverse reactions: No Arrival Time: 08:09 Has Dressing in Place as Prescribed: Yes Accompanied By: self Pain Present Now: No Transfer Assistance: EasyPivot Patient Lift Patient Identification Verified: Yes Secondary Verification Process Completed: Yes Patient Requires Transmission-Based No Precautions: Patient Has Alerts: Yes Patient Alerts: Patient on Blood Thinner NOT diabetic aspirin 81mg  Lives  [facility] ABI R 0.81 11/05/22 ABI L 0.59 11/05/22 [facility] 03/03/23 Electronic Signature(s) Signed: 05/25/2023 11:15:34 AM By: Chad Aver MSN RN CNS WTA Entered By: Chad [name] on 05/25/2023 08:15:34 -------------------------------------------------------------------------------- Clinic Level of Care Assessment Details Patient Name: Date of Service: Chad [name], Chad [name] 05/25/2023 8:30 A M Medical Record Number: [id number] Patient Account Number: [id number] Date of Birth/Sex: Treating RN: [birth year][name] (62 y.o. Chad [name] Primary Care Maudean Hoffmann: Chad [name] Other Clinician: Referring Telicia Hodgkiss: Treating Trenten Watchman/Extender: Chad [name]: 74 Clinic Level of Care Assessment Items TOOL 4 Quantity Score X- 1 0 Use when only an EandM is performed on FOLLOW-UP visit ASSESSMENTS - Nursing Assessment / Reassessment TILAK, BRIETZKE  ([id number]) [id number] Page 2 of 17 X- 1 10 Reassessment of Co-morbidities (includes updates in patient status) X- 1 5 Reassessment of Adherence to [name] Plan ASSESSMENTS - Wound and Skin A ssessment / Reassessment []  - 0 Simple Wound Assessment / Reassessment - one wound X- 5 5 Complex Wound Assessment / Reassessment - multiple wounds []  - 0 Dermatologic / Skin Assessment (not related to wound area) ASSESSMENTS - Focused Assessment []  - 0 Circumferential Edema Measurements - multi extremities []  - 0 Nutritional Assessment / Counseling / Intervention []  - 0 Lower Extremity Assessment (monofilament, tuning fork, pulses) []  - 0 Peripheral Arterial Disease Assessment (using hand held doppler) ASSESSMENTS - Ostomy and/or Continence Assessment and Care []  - 0 Incontinence Assessment and Management []  - 0 Ostomy Care Assessment and Management (repouching, etc.) PROCESS - Coordination of Care []  - 0 Simple Patient / Family Education for ongoing care X- 1 20 Complex (extensive) Patient / Family Education for ongoing care X- 1 10 Staff obtains Chiropractor, Records, T Results / Process Orders est []  - 0 Staff telephones HHA, Nursing Homes / Clarify orders / etc []  - 0 Routine Transfer to another Facility (non-emergent condition) []  - 0 Routine Hospital Admission (non-emergent condition) []  - 0 New Admissions / Manufacturing engineer / Ordering NPWT Apligraf, etc. , []  - 0 Emergency Hospital Admission (emergent condition) []  - 0 Simple Discharge Coordination X- 1 15 Complex (extensive) Discharge Coordination PROCESS - Special Needs []  - 0 Pediatric / Minor Patient Management []  - 0 Isolation Patient Management []  - 0 Hearing / Language / Visual special needs []  - 0 Assessment of Community assistance (transportation, D/C planning, etc.) []  - 0 Additional assistance / Altered mentation []  - 0 Support Surface(s) Assessment (bed, cushion,  seat, etc.) INTERVENTIONS - Wound Cleansing / Measurement []  - 0 Simple Wound Cleansing - one wound X- 5 5 Complex Wound Cleansing - multiple wounds X- 1 5 Wound Imaging (photographs - any number of wounds) []  -  Cleanse the wound with Wound Cleanser prior to applying a clean dressing using gauze sponges, not tissues or cotton balls. Do not scrub or use excessive force. Pat dry using gauze sponges, not tissue or cotton balls. Peri-Wound Care Topical Primary Dressing Secondary Dressing Zetuvit Plus 4x8 (in/in) Heel Cup Secured With [employer] Sterile or Non-Sterile 6-ply 4.5x4 (yd/yd) Discharge Instruction: Apply Kerlix as directed Tubigrip Size D, 3x10 (in/yd) Compression Wrap Compression Stockings Add-Ons Electronic Signature(s) Signed: 05/25/2023 4:36:05 PM By: Chad Aver MSN RN CNS WTA Entered By: Chad [name] on 05/25/2023 05:44:26 -------------------------------------------------------------------------------- Wound Assessment Details Patient Name: Date of Service: Chad [name] 05/25/2023 8:30 A M Medical Record Number: [id number] Patient Account Number: [id number] Date of Birth/Sex: Treating RN: [birth year] [name] (62 y.o. Chad [name] Primary Care Ishika Chesterfield: Chad [name] Other Clinician: Referring Breelynn Bankert: Treating Kyleigh Nannini/Extender: Chad [name]: [address] Wound Status Chad [name] ([id number]) [id number] Page 12 of 17 Wound Number: 18 Primary Pressure Ulcer Etiology: Wound Location: Right, Medial T  Second oe Wound Open Wounding Event: Pressure Injury Status: Date Acquired: 09/26/2022 Comorbid Anemia, Chronic Obstructive Pulmonary Disease (COPD), Coronary Weeks Of [name]: 31 History: Artery Disease, Peripheral Arterial Disease, Peripheral Venous Clustered Wound: No Disease, Hepatitis C, Osteoarthritis, Neuropathy, Received Chemotherapy Photos Wound Measurements Length: (cm) 2.5 Width: (cm) 2 Depth: (cm) 0.1 Area: (cm) 3.927 Volume: (cm) 0.393 % Reduction in Area: -614% % Reduction in Volume: -614.5% Epithelialization: None Wound Description Classification: Category/Stage III Exudate Amount: Medium Exudate Type: Serosanguineous Exudate Color: red, brown Foul Odor After Cleansing: No Slough/Fibrino Yes Wound Bed Granulation Amount: Small (1-33%) Exposed Structure Granulation Quality: Red, Pink Fascia Exposed: No Necrotic Amount: Medium (34-66%) Fat Layer (Subcutaneous Tissue) Exposed: Yes Necrotic Quality: Adherent Slough Tendon Exposed: No Muscle Exposed: No Joint Exposed: No Bone Exposed: No [name] Notes Wound #18 (Toe Second) Wound Laterality: Right, Medial Cleanser Soap and Water Discharge Instruction: Gently cleanse wound with antibacterial soap, rinse and pat dry prior to dressing wounds Wound Cleanser Discharge Instruction: Wash your hands with soap and water. Remove old dressing, discard into plastic bag and place into trash. Cleanse the wound with Wound Cleanser prior to applying a clean dressing using gauze sponges, not tissues or cotton balls. Do not scrub or use excessive force. Pat dry using gauze sponges, not tissue or cotton balls. Peri-Wound Care Topical Primary Dressing Secondary Dressing Zetuvit Plus 4x8 (in/in) Heel Cup Secured With [employer] Sterile or Non-Sterile 6-ply 4.5x4 (yd/yd) Discharge Instruction: Apply Kerlix as directed Tubigrip Size D, 3x10 (in/yd) Compression Wrap Compression Stockings Add-Ons STANTON, Chad [name]  ([id number]) [id number] Page 13 of 17 Electronic Signature(s) Signed: 05/25/2023 4:36:05 PM By: Chad Aver MSN RN CNS WTA Entered By: Chad [name] on 05/25/2023 05:45:38 -------------------------------------------------------------------------------- Wound Assessment Details Patient Name: Date of Service: Chad [name], Chad [name] 05/25/2023 8:30 A M Medical Record Number: [id number] Patient Account Number: [id number] Date of Birth/Sex: Treating RN: [name][birth year] (62 y.o. Chad [name] Primary Care Natiya Seelinger: Chad [name] Other Clinician: Referring Lucillie Kiesel: Treating Husayn Reim/Extender: Chad [name]: 76 Wound Status Wound Number: 21 Primary Venous Leg Ulcer Etiology: Wound Location: Right, Dorsal Foot Wound Open Wounding Event: Pressure Injury Status: Date Acquired: 03/02/2023 Comorbid Anemia, Chronic Obstructive Pulmonary Disease (COPD), Coronary Weeks Of [name]: 8 History: Artery Disease, Peripheral Arterial Disease, Peripheral Venous Clustered Wound: Yes Disease, Hepatitis C, Osteoarthritis, Neuropathy, Received Chemotherapy Photos Wound Measurements Length: (cm) 3.5 Width: (cm) 4.5 Depth: (cm) 0.2 Area: (cm) 12.37 Volume: (cm) 2.474 % Reduction in Area: -600.1% % Reduction in  [id number] Date of Birth/Sex: Treating RN: [birth year][name] (62 y.o. Chad [name] Primary Care Aum Caggiano: Chad [name] Other Clinician: Referring Anayansi Rundquist: Treating Tailor Lucking/Extender: Chad [name]: 74 Active Inactive Venous Leg Ulcer Nursing  Diagnoses: Knowledge deficit related to disease process and management Goals: Patient will maintain optimal edema control Date Initiated: 12/06/2021 Target Resolution Date: 06/26/2023 Goal Status: Active Patient/caregiver will verbalize understanding of disease process and disease management Date Initiated: 12/06/2021 Date Inactivated: 12/19/2022 Target Resolution Date: 11/27/2022 Goal Status: Met Interventions: Assess peripheral edema status every visit. Compression as ordered Notes: Electronic Signature(s) Signed: 05/25/2023 11:16:35 AM By: Chad Aver MSN RN CNS Darnelle Going, [name] ([id number]) AM By: Chad Aver MSN RN CNS Margarita Rana [phone].pdf Page 9 of 17 Signed: 05/25/2023 11:16:35 Entered By: Chad [name] on 05/25/2023 08:16:35 -------------------------------------------------------------------------------- Pain Assessment Details Patient Name: Date of Service: IZIC, HEYD 05/25/2023 8:30 A M Medical Record Number: [id number] Patient Account Number: [id number] Date of Birth/Sex: Treating RN: [name]/[birth year] (62 y.o. Chad [name] Primary Care Dimas Scheck: Chad [name] Other Clinician: Referring Malaijah Houchen: Treating Jabrea Kallstrom/Extender: Chad [name]: 71 Active Problems Location of Pain Severity and Description of Pain Patient Has Paino Yes Site Locations Rate the pain. Current Pain Level: 5 Pain Management and Medication Current Pain Management: Electronic Signature(s) Signed: 05/25/2023 11:15:43 AM By: Chad Aver MSN RN CNS WTA Entered By: Chad [name] on 05/25/2023 08:15:43 -------------------------------------------------------------------------------- Patient/Caregiver Education Details Patient Name: Date of Service: Denna Haggard 10/28/2024andnbsp8:30 A M Medical Record Number: [id number] Patient Account Number: [id number] Date of Birth/Gender: Treating RN: [birth year][name] (62 y.o. Jagen, Osborne, [name]  ([id number]) [id number] Page 10 of 17 Primary Care Physician: Chad [name] Other Clinician: Referring Physician: Treating Physician/Extender: Chad [name] in [name]: 43 Education Assessment Education Provided To: Patient Education Topics Provided Wound/Skin Impairment: Handouts: Caring for Your Ulcer Methods: Explain/Verbal Responses: State content correctly Electronic Signature(s) Signed: 05/25/2023 4:36:05 PM By: Chad Aver MSN RN CNS WTA Entered By: Chad [name] on 05/25/2023 08:16:40 -------------------------------------------------------------------------------- Wound Assessment Details Patient Name: Date of Service: Chad [name] 05/25/2023 8:30 A M Medical Record Number: [id number] Patient Account Number: [id number] Date of Birth/Sex: Treating RN: [birth year] [name] (62 y.o. Chad [name] Primary Care Mariea Mcmartin: Chad [name] Other Clinician: Referring Sofi Bryars: Treating Natavia Sublette/Extender: Chad [name]: 76 Wound Status Wound Number: 16 Primary Pressure Ulcer Etiology: Wound Location: Right Calcaneus Wound Open Wounding Event: Gradually Appeared Status: Date Acquired: 09/26/2022 Comorbid Anemia, Chronic Obstructive Pulmonary Disease (COPD), Coronary Weeks Of [name]: 32 History: Artery Disease, Peripheral Arterial Disease, Peripheral Venous Clustered Wound: Yes Disease, Hepatitis C, Osteoarthritis, Neuropathy, Received Chemotherapy Photos Wound Measurements Length: (cm) 10 Width: (cm) 6 Depth: (cm) 0.2 Area: (cm) 47.124 Volume: (cm) 9.425 Schunk, Imran ([id number]) % Reduction in Area: -4902.5% % Reduction in Volume: -4913.3% Epithelialization: None [id number] Page 11 of 17 Wound Description Classification: Category/Stage III Exudate Amount: Medium Exudate Type: Serosanguineous Exudate Color: red, brown Slough/Fibrino Yes Wound Bed Granulation  Amount: Small (1-33%) Exposed Structure Granulation Quality: Red, Pink Fascia Exposed: No Necrotic Amount: Large (67-100%) Fat Layer (Subcutaneous Tissue) Exposed: Yes Necrotic Quality: Eschar, Adherent Slough Tendon Exposed: No Muscle Exposed: No Joint Exposed: No Bone Exposed: No [name] Notes Wound #16 (Calcaneus) Wound Laterality: Right Cleanser Soap and Water Discharge Instruction: Gently cleanse wound with antibacterial soap, rinse and pat dry prior to dressing wounds Wound Cleanser Discharge Instruction: Wash your hands with soap and water. Remove old dressing, discard into plastic bag and place into trash.  0 Wound Tracing (instead of photographs) []  - 0 Simple Wound Measurement - one wound X- 5 5 Complex Wound Measurement - multiple wounds INTERVENTIONS - Wound Dressings []  - 0 Small Wound Dressing one or multiple wounds X- 5 15 Medium Wound Dressing one or multiple wounds []  - 0 Large Wound Dressing one or multiple wounds []  - 0 Application of Medications - topical []  - 0 Application of Medications - injection Chad [name] ([id number]) [id number] Page 3 of 17 INTERVENTIONS - Miscellaneous []  - 0 External ear exam []  - 0 Specimen Collection (cultures, biopsies, blood, body fluids, etc.) []  - 0 Specimen(s) / Culture(s) sent or taken to Lab for analysis []  - 0 Patient Transfer (multiple staff / Michiel [name] Lift / Similar devices) []  - 0 Simple Staple / Suture removal (25 or less) []  - 0 Complex Staple / Suture removal (26 or more) []  - 0 Hypo / Hyperglycemic Management (close monitor of Blood Glucose) []  - 0 Ankle / Brachial Index (ABI) - do not check if billed separately X- 1 5 Vital Signs Has the patient been seen at the hospital within the last three years: Yes Total Score: 220 Level Of Care: New/Established - Level 5 Electronic Signature(s) Signed: 05/25/2023 4:36:05 PM By: Chad Aver MSN RN CNS WTA Entered By: Chad [name] on 05/25/2023 06:11:39 -------------------------------------------------------------------------------- Encounter Discharge Information Details Patient Name: Date of Service: Chad [name] 05/25/2023 8:30 A M Medical Record Number: [id number] Patient Account Number: [id number] Date of Birth/Sex: Treating RN: Chad [name] (62 y.o. Chad [name] Primary Care Chad [name]: Chad [name] Other  Clinician: Referring Chad [name]: Treating Chad [name]/Extender: Chad [name] in [name]: 87 Encounter Discharge Information Items Discharge Condition: Stable Ambulatory Status: Wheelchair Discharge Destination: Skilled Nursing Facility Telephoned: No Orders Sent: Yes Transportation: Other Accompanied By: self Schedule Follow-up Appointment: Yes Clinical Summary of Care: Electronic Signature(s) Signed: 05/25/2023 11:17:17 AM By: Chad Aver MSN RN CNS WTA Entered By: Chad [name] on 05/25/2023 08:17:16 Chad [name] ([id number]) [id number] Page 4 of 17 -------------------------------------------------------------------------------- Lower Extremity Assessment Details Patient Name: Date of Service: Chad [name], Chad [name] 05/25/2023 8:30 A M Medical Record Number: [id number] Patient Account Number: [id number] Date of Birth/Sex: Treating RN: [birth year][name] (62 y.o. Chad [name] Primary Care Chad [name]: Chad [name] Other Clinician: Referring Chad [name]: Treating Chad [name]/Extender: Chad [name]: 76 Edema Assessment Assessed: [Left: No] [Right: No] [Left: Edema] [Right: :] Calf Left: Right: Point of Measurement: 33 cm From Medial Instep 40.5 cm Ankle Left: Right: Point of Measurement: 12 cm From Medial Instep 24 cm Vascular Assessment Pulses: Dorsalis Pedis Palpable: [Right:Yes] Extremity colors, hair growth, and conditions: Extremity Color: [Right:Red] Hair Growth on Extremity: [Right:No] Temperature of Extremity: [Right:Warm] Capillary Refill: [Right:> 3 seconds] Dependent Rubor: [Right:No] Blanched when Elevated: [Right:No No] Toe Nail Assessment Left: Right: Thick: Yes Discolored: Yes Deformed: Yes Improper Length and Hygiene: Yes Electronic Signature(s) Signed: 05/25/2023 11:15:53 AM By: Chad Aver MSN RN CNS WTA Entered By: Chad [name] on 05/25/2023 08:15:53 Chad [name] ([id number])  [id number] Page 5 of 17 -------------------------------------------------------------------------------- Multi Wound Chart Details Patient Name: Date of Service: Chad [name], Chad [name] 05/25/2023 8:30 A M Medical Record Number: [id number] Patient Account Number: [id number] Date of Birth/Sex: Treating RN: Chad [name] (62 y.o. Chad [name] Primary Care Chad [name]: Chad [name] Other Clinician: Referring Chad [name]: Treating Chad [name]/Extender: Chad [name]: 31 Vital Signs Height(in): 69 Pulse(bpm): 85 Weight(lbs): 150 Blood Pressure(mmHg): 154/92 Body Mass Index(BMI): 22.1 Temperature(F): 97.8 Respiratory Rate(breaths/min): 18 [16:Photos:] Right Calcaneus Right, Medial T Second  Chad [name] ([id number]) [id number] Page 1 of 17 Visit Report for 05/25/2023 Arrival Information Details Patient Name: Date of Service: Chad [name], Chad [name] 05/25/2023 8:30 A M Medical Record Number: [id number] Patient Account Number: [id number] Date of Birth/Sex: Treating RN: [birth year][name] (62 y.o. Chad [name] Primary Care Chad [name]: Chad [name] Other Clinician: Referring Chad [name]: Treating Chad [name]: Chad [name] in [name]: 50 Visit Information History Since Last Visit Added or deleted any medications: No Patient Arrived: Wheel Chair Any new allergies or adverse reactions: No Arrival Time: 08:09 Has Dressing in Place as Prescribed: Yes Accompanied By: self Pain Present Now: No Transfer Assistance: EasyPivot Patient Lift Patient Identification Verified: Yes Secondary Verification Process Completed: Yes Patient Requires Transmission-Based No Precautions: Patient Has Alerts: Yes Patient Alerts: Patient on Blood Thinner NOT diabetic aspirin 81mg  Lives  [facility] ABI R 0.81 11/05/22 ABI L 0.59 11/05/22 [facility] 03/03/23 Electronic Signature(s) Signed: 05/25/2023 11:15:34 AM By: Chad Aver MSN RN CNS WTA Entered By: Chad [name] on 05/25/2023 08:15:34 -------------------------------------------------------------------------------- Clinic Level of Care Assessment Details Patient Name: Date of Service: Chad [name], Chad [name] 05/25/2023 8:30 A M Medical Record Number: [id number] Patient Account Number: [id number] Date of Birth/Sex: Treating RN: [birth year][name] (62 y.o. Chad [name] Primary Care Maudean Hoffmann: Chad [name] Other Clinician: Referring Telicia Hodgkiss: Treating Trenten Watchman/Extender: Chad [name]: 74 Clinic Level of Care Assessment Items TOOL 4 Quantity Score X- 1 0 Use when only an EandM is performed on FOLLOW-UP visit ASSESSMENTS - Nursing Assessment / Reassessment TILAK, BRIETZKE  ([id number]) [id number] Page 2 of 17 X- 1 10 Reassessment of Co-morbidities (includes updates in patient status) X- 1 5 Reassessment of Adherence to [name] Plan ASSESSMENTS - Wound and Skin A ssessment / Reassessment []  - 0 Simple Wound Assessment / Reassessment - one wound X- 5 5 Complex Wound Assessment / Reassessment - multiple wounds []  - 0 Dermatologic / Skin Assessment (not related to wound area) ASSESSMENTS - Focused Assessment []  - 0 Circumferential Edema Measurements - multi extremities []  - 0 Nutritional Assessment / Counseling / Intervention []  - 0 Lower Extremity Assessment (monofilament, tuning fork, pulses) []  - 0 Peripheral Arterial Disease Assessment (using hand held doppler) ASSESSMENTS - Ostomy and/or Continence Assessment and Care []  - 0 Incontinence Assessment and Management []  - 0 Ostomy Care Assessment and Management (repouching, etc.) PROCESS - Coordination of Care []  - 0 Simple Patient / Family Education for ongoing care X- 1 20 Complex (extensive) Patient / Family Education for ongoing care X- 1 10 Staff obtains Chiropractor, Records, T Results / Process Orders est []  - 0 Staff telephones HHA, Nursing Homes / Clarify orders / etc []  - 0 Routine Transfer to another Facility (non-emergent condition) []  - 0 Routine Hospital Admission (non-emergent condition) []  - 0 New Admissions / Manufacturing engineer / Ordering NPWT Apligraf, etc. , []  - 0 Emergency Hospital Admission (emergent condition) []  - 0 Simple Discharge Coordination X- 1 15 Complex (extensive) Discharge Coordination PROCESS - Special Needs []  - 0 Pediatric / Minor Patient Management []  - 0 Isolation Patient Management []  - 0 Hearing / Language / Visual special needs []  - 0 Assessment of Community assistance (transportation, D/C planning, etc.) []  - 0 Additional assistance / Altered mentation []  - 0 Support Surface(s) Assessment (bed, cushion,  seat, etc.) INTERVENTIONS - Wound Cleansing / Measurement []  - 0 Simple Wound Cleansing - one wound X- 5 5 Complex Wound Cleansing - multiple wounds X- 1 5 Wound Imaging (photographs - any number of wounds) []  -  Volume: -1297.7% Epithelialization: Medium (34-66%) Wound Description Classification: Full Thickness Without Exposed Support Exudate Amount: Medium Exudate Type: Serous Exudate Color: amber Structures Foul Odor After Cleansing: No Slough/Fibrino Yes Wound Bed Granulation Amount: Small (1-33%) Exposed Structure Granulation Quality: Red, Pink Fascia Exposed: No Necrotic Amount: Small (1-33%) Fat Layer (Subcutaneous Tissue) Exposed: Yes Necrotic Quality: Adherent Slough Tendon Exposed: No Muscle Exposed: No Joint Exposed: No Bone Exposed: No Mandrell, [name] ([id number]) [id number] Page 14 of 17 [name]  Notes Wound #21 (Foot) Wound Laterality: Dorsal, Right Cleanser Soap and Water Discharge Instruction: Gently cleanse wound with antibacterial soap, rinse and pat dry prior to dressing wounds Wound Cleanser Discharge Instruction: Wash your hands with soap and water. Remove old dressing, discard into plastic bag and place into trash. Cleanse the wound with Wound Cleanser prior to applying a clean dressing using gauze sponges, not tissues or cotton balls. Do not scrub or use excessive force. Pat dry using gauze sponges, not tissue or cotton balls. Peri-Wound Care Topical Primary Dressing Secondary Dressing Zetuvit Plus 4x8 (in/in) Heel Cup Secured With [employer] Sterile or Non-Sterile 6-ply 4.5x4 (yd/yd) Discharge Instruction: Apply Kerlix as directed Tubigrip Size D, 3x10 (in/yd) Compression Wrap Compression Stockings Add-Ons Electronic Signature(s) Signed: 05/25/2023 4:36:05 PM By: Chad Aver MSN RN CNS WTA Entered By: Chad [name] on 05/25/2023 05:46:04 -------------------------------------------------------------------------------- Wound Assessment Details Patient Name: Date of Service: Chad [name] 05/25/2023 8:30 A M Medical Record Number: [id number] Patient Account Number: [id number] Date of Birth/Sex: Treating RN: [birth year][name] (62 y.o. Chad [name] Primary Care Anavictoria Wilk: Chad [name] Other Clinician: Referring Deaundra Kutzer: Treating Burley Kopka/Extender: Chad [name]: 76 Wound Status Wound Number: 22 Primary Venous Leg Ulcer Etiology: Wound Location: Right, Anterior Lower Leg Wound Open Wounding Event: Pressure Injury Status: Date Acquired: 03/02/2023 Comorbid Anemia, Chronic Obstructive Pulmonary Disease (COPD), Coronary Weeks Of [name]: 8 History: Artery Disease, Peripheral Arterial Disease, Peripheral Venous Clustered Wound: Yes Disease, Hepatitis C, Osteoarthritis, Neuropathy, Received Chemotherapy Photos DHRUV, GUSMANO  ([id number]) [id number] Page 15 of 17 Wound Measurements Length: (cm) 10 Width: (cm) 9.5 Depth: (cm) 0.1 Area: (cm) 74.613 Volume: (cm) 7.461 % Reduction in Area: -3066.9% % Reduction in Volume: -1484.1% Epithelialization: Small (1-33%) Wound Description Classification: Full Thickness Without Exposed Support Structures Exudate Amount: Medium Exudate Type: Serous Exudate Color: amber Foul Odor After Cleansing: No Slough/Fibrino Yes Wound Bed Granulation Amount: Medium (34-66%) Exposed Structure Granulation Quality: Red, Pink Fascia Exposed: No Necrotic Amount: Small (1-33%) Fat Layer (Subcutaneous Tissue) Exposed: Yes Necrotic Quality: Adherent Slough Tendon Exposed: No Muscle Exposed: No Joint Exposed: No Bone Exposed: No [name] Notes Wound #22 (Lower Leg) Wound Laterality: Right, Anterior Cleanser Soap and Water Discharge Instruction: Gently cleanse wound with antibacterial soap, rinse and pat dry prior to dressing wounds Wound Cleanser Discharge Instruction: Wash your hands with soap and water. Remove old dressing, discard into plastic bag and place into trash. Cleanse the wound with Wound Cleanser prior to applying a clean dressing using gauze sponges, not tissues or cotton balls. Do not scrub or use excessive force. Pat dry using gauze sponges, not tissue or cotton balls. Peri-Wound Care Topical Primary Dressing Secondary Dressing Zetuvit Plus 4x8 (in/in) Heel Cup Secured With [employer] Sterile or Non-Sterile 6-ply 4.5x4 (yd/yd) Discharge Instruction: Apply Kerlix as directed Tubigrip Size D, 3x10 (in/yd) Compression Wrap Compression Stockings Add-Ons Electronic Signature(s) Signed: 05/25/2023 4:36:05 PM By: Chad Aver MSN RN CNS WTA Entered By: Chad [name] on 05/25/2023 05:46:36 Chad [name] ([id number]) [id number] Page 16 of  17 --------------------------------------------------------------------------------  Chad [name] ([id number]) [id number] Page 1 of 17 Visit Report for 05/25/2023 Arrival Information Details Patient Name: Date of Service: Chad [name], Chad [name] 05/25/2023 8:30 A M Medical Record Number: [id number] Patient Account Number: [id number] Date of Birth/Sex: Treating RN: [birth year][name] (62 y.o. Chad [name] Primary Care Chad [name]: Chad [name] Other Clinician: Referring Chad [name]: Treating Chad [name]: Chad [name] in [name]: 50 Visit Information History Since Last Visit Added or deleted any medications: No Patient Arrived: Wheel Chair Any new allergies or adverse reactions: No Arrival Time: 08:09 Has Dressing in Place as Prescribed: Yes Accompanied By: self Pain Present Now: No Transfer Assistance: EasyPivot Patient Lift Patient Identification Verified: Yes Secondary Verification Process Completed: Yes Patient Requires Transmission-Based No Precautions: Patient Has Alerts: Yes Patient Alerts: Patient on Blood Thinner NOT diabetic aspirin 81mg  Lives  [facility] ABI R 0.81 11/05/22 ABI L 0.59 11/05/22 [facility] 03/03/23 Electronic Signature(s) Signed: 05/25/2023 11:15:34 AM By: Chad Aver MSN RN CNS WTA Entered By: Chad [name] on 05/25/2023 08:15:34 -------------------------------------------------------------------------------- Clinic Level of Care Assessment Details Patient Name: Date of Service: Chad [name], Chad [name] 05/25/2023 8:30 A M Medical Record Number: [id number] Patient Account Number: [id number] Date of Birth/Sex: Treating RN: [birth year][name] (62 y.o. Chad [name] Primary Care Maudean Hoffmann: Chad [name] Other Clinician: Referring Telicia Hodgkiss: Treating Trenten Watchman/Extender: Chad [name]: 74 Clinic Level of Care Assessment Items TOOL 4 Quantity Score X- 1 0 Use when only an EandM is performed on FOLLOW-UP visit ASSESSMENTS - Nursing Assessment / Reassessment TILAK, BRIETZKE  ([id number]) [id number] Page 2 of 17 X- 1 10 Reassessment of Co-morbidities (includes updates in patient status) X- 1 5 Reassessment of Adherence to [name] Plan ASSESSMENTS - Wound and Skin A ssessment / Reassessment []  - 0 Simple Wound Assessment / Reassessment - one wound X- 5 5 Complex Wound Assessment / Reassessment - multiple wounds []  - 0 Dermatologic / Skin Assessment (not related to wound area) ASSESSMENTS - Focused Assessment []  - 0 Circumferential Edema Measurements - multi extremities []  - 0 Nutritional Assessment / Counseling / Intervention []  - 0 Lower Extremity Assessment (monofilament, tuning fork, pulses) []  - 0 Peripheral Arterial Disease Assessment (using hand held doppler) ASSESSMENTS - Ostomy and/or Continence Assessment and Care []  - 0 Incontinence Assessment and Management []  - 0 Ostomy Care Assessment and Management (repouching, etc.) PROCESS - Coordination of Care []  - 0 Simple Patient / Family Education for ongoing care X- 1 20 Complex (extensive) Patient / Family Education for ongoing care X- 1 10 Staff obtains Chiropractor, Records, T Results / Process Orders est []  - 0 Staff telephones HHA, Nursing Homes / Clarify orders / etc []  - 0 Routine Transfer to another Facility (non-emergent condition) []  - 0 Routine Hospital Admission (non-emergent condition) []  - 0 New Admissions / Manufacturing engineer / Ordering NPWT Apligraf, etc. , []  - 0 Emergency Hospital Admission (emergent condition) []  - 0 Simple Discharge Coordination X- 1 15 Complex (extensive) Discharge Coordination PROCESS - Special Needs []  - 0 Pediatric / Minor Patient Management []  - 0 Isolation Patient Management []  - 0 Hearing / Language / Visual special needs []  - 0 Assessment of Community assistance (transportation, D/C planning, etc.) []  - 0 Additional assistance / Altered mentation []  - 0 Support Surface(s) Assessment (bed, cushion,  seat, etc.) INTERVENTIONS - Wound Cleansing / Measurement []  - 0 Simple Wound Cleansing - one wound X- 5 5 Complex Wound Cleansing - multiple wounds X- 1 5 Wound Imaging (photographs - any number of wounds) []  -

## 2023-05-25 NOTE — Progress Notes (Addendum)
Per Day/30 Days Secured With: Kerlix Roll Sterile or Non-Sterile 6-ply 4.5x4 (yd/yd) 1 x Per Day/30 Days Discharge Instructions: Apply Kerlix as directed Secured With: Tubigrip Size D, 3x10 (in/yd) 1 x Per Day/30 Days Electronic Signature(s) Signed: 05/25/2023 2:19:13 PM By: [name] PA-C Signed: 05/25/2023 4:36:05 PM By: Midge Aver MSN RN CNS WTA Entered By: Midge Aver on 05/25/2023 06:10:34 -------------------------------------------------------------------------------- Problem List Details Patient Name: Date of Service: [name] 05/25/2023 8:30 A M Medical Record Number: [id number] Patient Account Number: [id number] [name] ([id number]) [id number] Page 9 of 16 Date of Birth/Sex: Treating RN: Aug 02, 1960 (62 y.o. 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 02/19/2023 No Yes foot 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, stage 3 10/07/2022 No Yes Inactive Problems ICD-10 Code Description Active Date Inactive Date L97.322 Non-pressure chronic ulcer of left ankle with fat layer exposed 12/06/2021 12/06/2021 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 [name] Primary Care Provider: [name] Other Clinician: Referring Provider: Treating Provider/Extender: [name] Weeks in Treatment: [address] [place] ([id number]) [id number] Page 10 of 16 Subjective Chief Complaint Information obtained from Patient Left ankle ulcer, Left dorsal foot wound, right heel History of Present Illness (HPI) 10/08/18 on evaluation today patient actually presents to our office for initial evaluation concerning wounds that he has of the bilateral lower extremities. 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 He also has bilateral venous stasis. He apparently has been homeless currently following discharge from the hospital apparently he has been placed at [facility] which is is a skilled nursing facility locally. Nonetheless fortunately he does not show any signs of infection at this time which is good news. In fact several of the wound actually appears to be showing some signs of improvement already in my pinion. There are a couple areas in the left leg in particular there likely gonna require some sharp debridement to help clear away some necrotic tissue and help with more sufficient healing. No fevers, chills, nausea, or vomiting noted at this time. 10/15/18 on evaluation today patient actually appears to be doing very well in regard to his bilateral lower extremities. He's been tolerating the dressing changes without complication. Fortunately there does not appear to be any evidence of active infection at this time which is great news. Overall I'm actually very pleased with how this has progressed in just one visits time. Readmission: 08/14/2020 upon evaluation today patient presents for re-evaluation here in our clinic. He is having issues with his left  Per Day/30 Days Secured With: Kerlix Roll Sterile or Non-Sterile 6-ply 4.5x4 (yd/yd) 1 x Per Day/30 Days Discharge Instructions: Apply Kerlix as directed Secured With: Tubigrip Size D, 3x10 (in/yd) 1 x Per Day/30 Days Electronic Signature(s) Signed: 05/25/2023 2:19:13 PM By: [name] PA-C Signed: 05/25/2023 4:36:05 PM By: Midge Aver MSN RN CNS WTA Entered By: Midge Aver on 05/25/2023 06:10:34 -------------------------------------------------------------------------------- Problem List Details Patient Name: Date of Service: [name] 05/25/2023 8:30 A M Medical Record Number: [id number] Patient Account Number: [id number] [name] ([id number]) [id number] Page 9 of 16 Date of Birth/Sex: Treating RN: Aug 02, 1960 (62 y.o. 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 02/19/2023 No Yes foot 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, stage 3 10/07/2022 No Yes Inactive Problems ICD-10 Code Description Active Date Inactive Date L97.322 Non-pressure chronic ulcer of left ankle with fat layer exposed 12/06/2021 12/06/2021 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 [name] Primary Care Provider: [name] Other Clinician: Referring Provider: Treating Provider/Extender: [name] Weeks in Treatment: [address] [place] ([id number]) [id number] Page 10 of 16 Subjective Chief Complaint Information obtained from Patient Left ankle ulcer, Left dorsal foot wound, right heel History of Present Illness (HPI) 10/08/18 on evaluation today patient actually presents to our office for initial evaluation concerning wounds that he has of the bilateral lower extremities. 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 He also has bilateral venous stasis. He apparently has been homeless currently following discharge from the hospital apparently he has been placed at [facility] which is is a skilled nursing facility locally. Nonetheless fortunately he does not show any signs of infection at this time which is good news. In fact several of the wound actually appears to be showing some signs of improvement already in my pinion. There are a couple areas in the left leg in particular there likely gonna require some sharp debridement to help clear away some necrotic tissue and help with more sufficient healing. No fevers, chills, nausea, or vomiting noted at this time. 10/15/18 on evaluation today patient actually appears to be doing very well in regard to his bilateral lower extremities. He's been tolerating the dressing changes without complication. Fortunately there does not appear to be any evidence of active infection at this time which is great news. Overall I'm actually very pleased with how this has progressed in just one visits time. Readmission: 08/14/2020 upon evaluation today patient presents for re-evaluation here in our clinic. He is having issues with his left  the Right,Dorsal Foot. The wound measures 3.5cm length x 4.5cm width x 0.2cm depth; 12.37cm^2 area and 2.474cm^3  volume. There is Fat Layer (Subcutaneous Tissue) exposed. There is a medium amount of serous drainage noted. There is small (1-33%) red, pink granulation within the wound bed. There is a small (1-33%) amount of necrotic tissue within the wound bed including Adherent Slough. Wound #22 status is Open. Original cause of wound was Pressure Injury. The date acquired was: 03/02/2023. The wound has been in treatment 8 weeks. The wound is located on the Right,Anterior Lower Leg. The wound measures 10cm length x 9.5cm width x 0.1cm depth; 74.613cm^2 area and 7.461cm^3 volume. There is Fat Layer (Subcutaneous Tissue) exposed. There is a medium amount of serous drainage noted. There is medium (34-66%) red, pink granulation within the wound bed. There is a small (1-33%) amount of necrotic tissue within the wound bed including Adherent Slough. Wound #23 status is Open. Original cause of wound was Pressure Injury. The date acquired was: 05/02/2023. The wound has been in treatment 2 weeks. The wound is located on the Right,Posterior Lower Leg. The wound measures 6cm length x 3cm width x 0.1cm depth; 14.137cm^2 area and 1.414cm^3 volume. LISA, RASK ([id number]) [id number] Page 15 of 16 There is Fat Layer (Subcutaneous Tissue) exposed. There is a medium amount of serosanguineous drainage noted. There is medium (34-66%) red, pink granulation within the wound bed. There is a small (1-33%) amount of necrotic tissue within the wound bed including Adherent Slough. 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 Based on what  I am seeing I do believe that the patient needs to have a referral to infectious disease ASAP. I will give a copy of the PCR culture I am also going to recommend that we have a copy of the orders I sent back today because the patient I did mark on their needs to have daily dressing changes along with this referral as soon as possible. 2. I am also going to recommend based on what we are seeing that we have the patient continue to monitor for any signs of infection or worsening. I do believe that he is doing a really good job here with regard to getting these changes during the week based on what he tells me but when the primary nurse that does this during the week is not there on the weekends the last 2 weeks nothing has been changed all weekend long. With the amount of drainage she has he cannot go multiple days without having this change that needs to be daily. We will see patient back for reevaluation in 1 week here in the clinic. If anything worsens or changes patient will contact our office for additional recommendations. Electronic Signature(s) Signed: 05/25/2023 9:05:36 AM By: [name] PA-C Entered By: [name] on 05/25/2023 06:05:36 -------------------------------------------------------------------------------- SuperBill Details Patient Name: Date of Service: VALJEAN, OR 05/25/2023 Medical Record Number: [id number] Patient Account Number: [id number] Date of Birth/Sex: Treating RN: Jun 27, 1961 (62 y.o. [name] Primary Care Provider: [name] Other Clinician: Referring Provider: Treating Provider/Extender: [name] Weeks in Treatment: 76 Diagnosis Coding ICD-10 Codes Code Description [phone] Chronic venous hypertension (  Per Day/30 Days Secured With: Kerlix Roll Sterile or Non-Sterile 6-ply 4.5x4 (yd/yd) 1 x Per Day/30 Days Discharge Instructions: Apply Kerlix as directed Secured With: Tubigrip Size D, 3x10 (in/yd) 1 x Per Day/30 Days Electronic Signature(s) Signed: 05/25/2023 2:19:13 PM By: [name] PA-C Signed: 05/25/2023 4:36:05 PM By: Midge Aver MSN RN CNS WTA Entered By: Midge Aver on 05/25/2023 06:10:34 -------------------------------------------------------------------------------- Problem List Details Patient Name: Date of Service: [name] 05/25/2023 8:30 A M Medical Record Number: [id number] Patient Account Number: [id number] [name] ([id number]) [id number] Page 9 of 16 Date of Birth/Sex: Treating RN: Aug 02, 1960 (62 y.o. 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 02/19/2023 No Yes foot 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, stage 3 10/07/2022 No Yes Inactive Problems ICD-10 Code Description Active Date Inactive Date L97.322 Non-pressure chronic ulcer of left ankle with fat layer exposed 12/06/2021 12/06/2021 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 [name] Primary Care Provider: [name] Other Clinician: Referring Provider: Treating Provider/Extender: [name] Weeks in Treatment: [address] [place] ([id number]) [id number] Page 10 of 16 Subjective Chief Complaint Information obtained from Patient Left ankle ulcer, Left dorsal foot wound, right heel History of Present Illness (HPI) 10/08/18 on evaluation today patient actually presents to our office for initial evaluation concerning wounds that he has of the bilateral lower extremities. 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 He also has bilateral venous stasis. He apparently has been homeless currently following discharge from the hospital apparently he has been placed at [facility] which is is a skilled nursing facility locally. Nonetheless fortunately he does not show any signs of infection at this time which is good news. In fact several of the wound actually appears to be showing some signs of improvement already in my pinion. There are a couple areas in the left leg in particular there likely gonna require some sharp debridement to help clear away some necrotic tissue and help with more sufficient healing. No fevers, chills, nausea, or vomiting noted at this time. 10/15/18 on evaluation today patient actually appears to be doing very well in regard to his bilateral lower extremities. He's been tolerating the dressing changes without complication. Fortunately there does not appear to be any evidence of active infection at this time which is great news. Overall I'm actually very pleased with how this has progressed in just one visits time. Readmission: 08/14/2020 upon evaluation today patient presents for re-evaluation here in our clinic. He is having issues with his left  VIPUL, MCCLEAF ([id number]) [id number] Page 1 of 16 Visit Report for 05/25/2023 Chief Complaint Document Details Patient Name: Date of Service: LABYRON, NOHL 05/25/2023 8:30 A M Medical Record Number: [id number] Patient Account Number: [id number] Date of Birth/Sex: Treating RN: [birth year] (62 y.o. [name] Primary Care Provider: [name] Other Clinician: Referring Provider: Treating Provider/Extender: [name] Weeks in Treatment: 6 Information Obtained from: Patient Chief Complaint Left ankle ulcer, Left dorsal foot wound, right heel Electronic Signature(s) Signed: 05/25/2023 8:15:42 AM By: [name] PA-C Entered By: [name] on 05/25/2023 05:15:42 -------------------------------------------------------------------------------- HPI Details Patient Name: Date of Service: [name] 05/25/2023 8:30 A M Medical Record Number: [id number] Patient Account Number: [id number] Date of Birth/Sex: Treating RN: [birth year] (62 y.o. [name] Primary Care Provider: [name] Other Clinician: Referring Provider: Treating Provider/Extender: [name] Weeks in Treatment: 26 History of Present Illness HPI Description: 10/08/18 on evaluation today patient actually presents to our office for initial evaluation concerning wounds that he has of the bilateral lower extremities. 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 He also has bilateral venous stasis. He apparently has been homeless currently following discharge from the hospital apparently he has been placed at [facility] which is is a skilled nursing facility locally. Nonetheless fortunately he does not show any signs of infection at this time which is good news. In fact several of the wound  actually appears to be showing some signs of improvement already in my pinion. There are a couple areas in the left leg in particular there likely gonna require some sharp debridement to help clear away some necrotic tissue and help with more sufficient healing. No fevers, chills, nausea, or vomiting noted at this time. 10/15/18 on evaluation today patient actually appears to be doing very well in regard to his bilateral lower extremities. He's been tolerating the dressing changes without complication. Fortunately there does not appear to be any evidence of active infection at this time which is great news. Overall I'm actually very pleased with how this has progressed in just one visits time. Readmission: 08/14/2020 upon evaluation today patient presents for re-evaluation here in our clinic. He is having issues with his left ankle region as well as his right toe and his right heel. He tells me that the toe and heel actually began as a area that was itching that he was scratching and then subsequently opened up into wounds. These may have been abscess areas I presume based on what I am seeing currently. With regard to his left ankle region he tells me this was a similar type RIGOVERTO, STONEBARGER ([id number]) [id number] Page 2 of 16 occurrence although he does have venous stasis this very well may be more of a venous leg ulcer more than anything. Nonetheless I do believe that the patient would benefit from appropriate and aggressive wound care to try to help get things under better control here. He does have history of a stroke on the left side affecting him to some degree there that he is able to stand although he does have some residual weakness. Otherwise again the patient does have chronic venous insufficiency as previously noted. His arterial studies most recently obtained showed that he had an ABI on the right of 1.16 with a TBI of 0.52 and on the left and ABI of 1.14 with  Per Day/30 Days Secured With: Kerlix Roll Sterile or Non-Sterile 6-ply 4.5x4 (yd/yd) 1 x Per Day/30 Days Discharge Instructions: Apply Kerlix as directed Secured With: Tubigrip Size D, 3x10 (in/yd) 1 x Per Day/30 Days Electronic Signature(s) Signed: 05/25/2023 2:19:13 PM By: [name] PA-C Signed: 05/25/2023 4:36:05 PM By: Midge Aver MSN RN CNS WTA Entered By: Midge Aver on 05/25/2023 06:10:34 -------------------------------------------------------------------------------- Problem List Details Patient Name: Date of Service: [name] 05/25/2023 8:30 A M Medical Record Number: [id number] Patient Account Number: [id number] [name] ([id number]) [id number] Page 9 of 16 Date of Birth/Sex: Treating RN: Aug 02, 1960 (62 y.o. 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 02/19/2023 No Yes foot 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, stage 3 10/07/2022 No Yes Inactive Problems ICD-10 Code Description Active Date Inactive Date L97.322 Non-pressure chronic ulcer of left ankle with fat layer exposed 12/06/2021 12/06/2021 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 [name] Primary Care Provider: [name] Other Clinician: Referring Provider: Treating Provider/Extender: [name] Weeks in Treatment: [address] [place] ([id number]) [id number] Page 10 of 16 Subjective Chief Complaint Information obtained from Patient Left ankle ulcer, Left dorsal foot wound, right heel History of Present Illness (HPI) 10/08/18 on evaluation today patient actually presents to our office for initial evaluation concerning wounds that he has of the bilateral lower extremities. 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 He also has bilateral venous stasis. He apparently has been homeless currently following discharge from the hospital apparently he has been placed at [facility] which is is a skilled nursing facility locally. Nonetheless fortunately he does not show any signs of infection at this time which is good news. In fact several of the wound actually appears to be showing some signs of improvement already in my pinion. There are a couple areas in the left leg in particular there likely gonna require some sharp debridement to help clear away some necrotic tissue and help with more sufficient healing. No fevers, chills, nausea, or vomiting noted at this time. 10/15/18 on evaluation today patient actually appears to be doing very well in regard to his bilateral lower extremities. He's been tolerating the dressing changes without complication. Fortunately there does not appear to be any evidence of active infection at this time which is great news. Overall I'm actually very pleased with how this has progressed in just one visits time. Readmission: 08/14/2020 upon evaluation today patient presents for re-evaluation here in our clinic. He is having issues with his left  VIPUL, MCCLEAF ([id number]) [id number] Page 1 of 16 Visit Report for 05/25/2023 Chief Complaint Document Details Patient Name: Date of Service: LABYRON, NOHL 05/25/2023 8:30 A M Medical Record Number: [id number] Patient Account Number: [id number] Date of Birth/Sex: Treating RN: [birth year] (62 y.o. [name] Primary Care Provider: [name] Other Clinician: Referring Provider: Treating Provider/Extender: [name] Weeks in Treatment: 6 Information Obtained from: Patient Chief Complaint Left ankle ulcer, Left dorsal foot wound, right heel Electronic Signature(s) Signed: 05/25/2023 8:15:42 AM By: [name] PA-C Entered By: [name] on 05/25/2023 05:15:42 -------------------------------------------------------------------------------- HPI Details Patient Name: Date of Service: [name] 05/25/2023 8:30 A M Medical Record Number: [id number] Patient Account Number: [id number] Date of Birth/Sex: Treating RN: [birth year] (62 y.o. [name] Primary Care Provider: [name] Other Clinician: Referring Provider: Treating Provider/Extender: [name] Weeks in Treatment: 26 History of Present Illness HPI Description: 10/08/18 on evaluation today patient actually presents to our office for initial evaluation concerning wounds that he has of the bilateral lower extremities. 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 He also has bilateral venous stasis. He apparently has been homeless currently following discharge from the hospital apparently he has been placed at [facility] which is is a skilled nursing facility locally. Nonetheless fortunately he does not show any signs of infection at this time which is good news. In fact several of the wound  actually appears to be showing some signs of improvement already in my pinion. There are a couple areas in the left leg in particular there likely gonna require some sharp debridement to help clear away some necrotic tissue and help with more sufficient healing. No fevers, chills, nausea, or vomiting noted at this time. 10/15/18 on evaluation today patient actually appears to be doing very well in regard to his bilateral lower extremities. He's been tolerating the dressing changes without complication. Fortunately there does not appear to be any evidence of active infection at this time which is great news. Overall I'm actually very pleased with how this has progressed in just one visits time. Readmission: 08/14/2020 upon evaluation today patient presents for re-evaluation here in our clinic. He is having issues with his left ankle region as well as his right toe and his right heel. He tells me that the toe and heel actually began as a area that was itching that he was scratching and then subsequently opened up into wounds. These may have been abscess areas I presume based on what I am seeing currently. With regard to his left ankle region he tells me this was a similar type RIGOVERTO, STONEBARGER ([id number]) [id number] Page 2 of 16 occurrence although he does have venous stasis this very well may be more of a venous leg ulcer more than anything. Nonetheless I do believe that the patient would benefit from appropriate and aggressive wound care to try to help get things under better control here. He does have history of a stroke on the left side affecting him to some degree there that he is able to stand although he does have some residual weakness. Otherwise again the patient does have chronic venous insufficiency as previously noted. His arterial studies most recently obtained showed that he had an ABI on the right of 1.16 with a TBI of 0.52 and on the left and ABI of 1.14 with  when he was on the Levaquin I felt like it was improving some but the concern there is QT prolongation I do not want to continue that personally further I think he needs a referral ASAP to infectious disease. I discussed that with him today. He also needs to be having this changed every day and that has not been happening at this point. That is my biggest concern here. Electronic Signature(s) Signed: 05/25/2023 9:04:18 AM By: [name] PA-C Entered By: [name] on 05/25/2023 06:04:18 -------------------------------------------------------------------------------- Physical Exam Details Patient Name: Date of Service: CRAIG, WESTERMANN 05/25/2023 8:30 A M Medical Record Number: [id number] Patient Account Number: [id number] Date of Birth/Sex: Treating RN: [birth year] (62 y.o. [name] Primary Care Provider: [name] Other Clinician: Referring Provider: Treating Provider/Extender: [name] Weeks in Treatment: 42 Constitutional Well-nourished and well-hydrated  in no acute distress. Respiratory normal breathing without difficulty. Psychiatric this patient is able to make decisions and demonstrates good insight into disease process. Alert and Oriented x 3. pleasant and cooperative. Notes Upon inspection patient's wound bed actually showed signs still having significant drainage I feel like that were have a lot of issues here based on the fact that this is not being changed daily when I see him on Mondays it is the worst that it sounds like it generally looks in general. Electronic Signature(s) Signed: 05/25/2023 9:04:32 AM By: [name] PA-C Entered By: [name] on 05/25/2023 06:04:32 -------------------------------------------------------------------------------- Physician Orders Details Patient Name: Date of Service: [name] 05/25/2023 8:30 A M Medical Record Number: [id number] Patient Account Number: [id number] ISHAMEL, [name] ([id number]) [id number].pdf Page 7 of 16 Date of Birth/Sex: Treating RN: October 01, 1960 (62 y.o. 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 Nurse Visit as needed Bathing/ Shower/ Hygiene Clean wound with Normal Saline or wound  cleanser. No tub bath. Anesthetic (Use 'Patient Medications' Section for Anesthetic Order Entry) Lidocaine applied to wound bed Off-Loading Heel suspension boot - when in bed Turn and reposition every 2 hours Medications-Please add to medication list. ntibiotics - RX renewed 05/25/2023 P.O. 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 Remove old dressing, discard into plastic bag and place into trash. Cleanse the wound with Wound Cleanser prior to applying a clean dressing using gauze sponges, not tissues or cotton balls. Do not scrub or use excessive force. Pat dry using gauze sponges, not tissue or cotton balls. Secondary Dressing: Zetuvit Plus 4x8 (in/in) 1 x Per Day/30 Days Secondary Dressing: Heel Cup 1 x Per Day/30 Days Secured With: Kerlix Roll Sterile or Non-Sterile 6-ply 4.5x4 (yd/yd) 1 x Per Day/30 Days  the Right,Dorsal Foot. The wound measures 3.5cm length x 4.5cm width x 0.2cm depth; 12.37cm^2 area and 2.474cm^3  volume. There is Fat Layer (Subcutaneous Tissue) exposed. There is a medium amount of serous drainage noted. There is small (1-33%) red, pink granulation within the wound bed. There is a small (1-33%) amount of necrotic tissue within the wound bed including Adherent Slough. Wound #22 status is Open. Original cause of wound was Pressure Injury. The date acquired was: 03/02/2023. The wound has been in treatment 8 weeks. The wound is located on the Right,Anterior Lower Leg. The wound measures 10cm length x 9.5cm width x 0.1cm depth; 74.613cm^2 area and 7.461cm^3 volume. There is Fat Layer (Subcutaneous Tissue) exposed. There is a medium amount of serous drainage noted. There is medium (34-66%) red, pink granulation within the wound bed. There is a small (1-33%) amount of necrotic tissue within the wound bed including Adherent Slough. Wound #23 status is Open. Original cause of wound was Pressure Injury. The date acquired was: 05/02/2023. The wound has been in treatment 2 weeks. The wound is located on the Right,Posterior Lower Leg. The wound measures 6cm length x 3cm width x 0.1cm depth; 14.137cm^2 area and 1.414cm^3 volume. LISA, RASK ([id number]) [id number] Page 15 of 16 There is Fat Layer (Subcutaneous Tissue) exposed. There is a medium amount of serosanguineous drainage noted. There is medium (34-66%) red, pink granulation within the wound bed. There is a small (1-33%) amount of necrotic tissue within the wound bed including Adherent Slough. 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 Based on what  I am seeing I do believe that the patient needs to have a referral to infectious disease ASAP. I will give a copy of the PCR culture I am also going to recommend that we have a copy of the orders I sent back today because the patient I did mark on their needs to have daily dressing changes along with this referral as soon as possible. 2. I am also going to recommend based on what we are seeing that we have the patient continue to monitor for any signs of infection or worsening. I do believe that he is doing a really good job here with regard to getting these changes during the week based on what he tells me but when the primary nurse that does this during the week is not there on the weekends the last 2 weeks nothing has been changed all weekend long. With the amount of drainage she has he cannot go multiple days without having this change that needs to be daily. We will see patient back for reevaluation in 1 week here in the clinic. If anything worsens or changes patient will contact our office for additional recommendations. Electronic Signature(s) Signed: 05/25/2023 9:05:36 AM By: [name] PA-C Entered By: [name] on 05/25/2023 06:05:36 -------------------------------------------------------------------------------- SuperBill Details Patient Name: Date of Service: VALJEAN, OR 05/25/2023 Medical Record Number: [id number] Patient Account Number: [id number] Date of Birth/Sex: Treating RN: Jun 27, 1961 (62 y.o. [name] Primary Care Provider: [name] Other Clinician: Referring Provider: Treating Provider/Extender: [name] Weeks in Treatment: 76 Diagnosis Coding ICD-10 Codes Code Description [phone] Chronic venous hypertension (  VIPUL, MCCLEAF ([id number]) [id number] Page 1 of 16 Visit Report for 05/25/2023 Chief Complaint Document Details Patient Name: Date of Service: LABYRON, NOHL 05/25/2023 8:30 A M Medical Record Number: [id number] Patient Account Number: [id number] Date of Birth/Sex: Treating RN: [birth year] (62 y.o. [name] Primary Care Provider: [name] Other Clinician: Referring Provider: Treating Provider/Extender: [name] Weeks in Treatment: 6 Information Obtained from: Patient Chief Complaint Left ankle ulcer, Left dorsal foot wound, right heel Electronic Signature(s) Signed: 05/25/2023 8:15:42 AM By: [name] PA-C Entered By: [name] on 05/25/2023 05:15:42 -------------------------------------------------------------------------------- HPI Details Patient Name: Date of Service: [name] 05/25/2023 8:30 A M Medical Record Number: [id number] Patient Account Number: [id number] Date of Birth/Sex: Treating RN: [birth year] (62 y.o. [name] Primary Care Provider: [name] Other Clinician: Referring Provider: Treating Provider/Extender: [name] Weeks in Treatment: 26 History of Present Illness HPI Description: 10/08/18 on evaluation today patient actually presents to our office for initial evaluation concerning wounds that he has of the bilateral lower extremities. 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 He also has bilateral venous stasis. He apparently has been homeless currently following discharge from the hospital apparently he has been placed at [facility] which is is a skilled nursing facility locally. Nonetheless fortunately he does not show any signs of infection at this time which is good news. In fact several of the wound  actually appears to be showing some signs of improvement already in my pinion. There are a couple areas in the left leg in particular there likely gonna require some sharp debridement to help clear away some necrotic tissue and help with more sufficient healing. No fevers, chills, nausea, or vomiting noted at this time. 10/15/18 on evaluation today patient actually appears to be doing very well in regard to his bilateral lower extremities. He's been tolerating the dressing changes without complication. Fortunately there does not appear to be any evidence of active infection at this time which is great news. Overall I'm actually very pleased with how this has progressed in just one visits time. Readmission: 08/14/2020 upon evaluation today patient presents for re-evaluation here in our clinic. He is having issues with his left ankle region as well as his right toe and his right heel. He tells me that the toe and heel actually began as a area that was itching that he was scratching and then subsequently opened up into wounds. These may have been abscess areas I presume based on what I am seeing currently. With regard to his left ankle region he tells me this was a similar type RIGOVERTO, STONEBARGER ([id number]) [id number] Page 2 of 16 occurrence although he does have venous stasis this very well may be more of a venous leg ulcer more than anything. Nonetheless I do believe that the patient would benefit from appropriate and aggressive wound care to try to help get things under better control here. He does have history of a stroke on the left side affecting him to some degree there that he is able to stand although he does have some residual weakness. Otherwise again the patient does have chronic venous insufficiency as previously noted. His arterial studies most recently obtained showed that he had an ABI on the right of 1.16 with a TBI of 0.52 and on the left and ABI of 1.14 with  VIPUL, MCCLEAF ([id number]) [id number] Page 1 of 16 Visit Report for 05/25/2023 Chief Complaint Document Details Patient Name: Date of Service: LABYRON, NOHL 05/25/2023 8:30 A M Medical Record Number: [id number] Patient Account Number: [id number] Date of Birth/Sex: Treating RN: [birth year] (62 y.o. [name] Primary Care Provider: [name] Other Clinician: Referring Provider: Treating Provider/Extender: [name] Weeks in Treatment: 6 Information Obtained from: Patient Chief Complaint Left ankle ulcer, Left dorsal foot wound, right heel Electronic Signature(s) Signed: 05/25/2023 8:15:42 AM By: [name] PA-C Entered By: [name] on 05/25/2023 05:15:42 -------------------------------------------------------------------------------- HPI Details Patient Name: Date of Service: [name] 05/25/2023 8:30 A M Medical Record Number: [id number] Patient Account Number: [id number] Date of Birth/Sex: Treating RN: [birth year] (62 y.o. [name] Primary Care Provider: [name] Other Clinician: Referring Provider: Treating Provider/Extender: [name] Weeks in Treatment: 26 History of Present Illness HPI Description: 10/08/18 on evaluation today patient actually presents to our office for initial evaluation concerning wounds that he has of the bilateral lower extremities. 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 He also has bilateral venous stasis. He apparently has been homeless currently following discharge from the hospital apparently he has been placed at [facility] which is is a skilled nursing facility locally. Nonetheless fortunately he does not show any signs of infection at this time which is good news. In fact several of the wound  actually appears to be showing some signs of improvement already in my pinion. There are a couple areas in the left leg in particular there likely gonna require some sharp debridement to help clear away some necrotic tissue and help with more sufficient healing. No fevers, chills, nausea, or vomiting noted at this time. 10/15/18 on evaluation today patient actually appears to be doing very well in regard to his bilateral lower extremities. He's been tolerating the dressing changes without complication. Fortunately there does not appear to be any evidence of active infection at this time which is great news. Overall I'm actually very pleased with how this has progressed in just one visits time. Readmission: 08/14/2020 upon evaluation today patient presents for re-evaluation here in our clinic. He is having issues with his left ankle region as well as his right toe and his right heel. He tells me that the toe and heel actually began as a area that was itching that he was scratching and then subsequently opened up into wounds. These may have been abscess areas I presume based on what I am seeing currently. With regard to his left ankle region he tells me this was a similar type RIGOVERTO, STONEBARGER ([id number]) [id number] Page 2 of 16 occurrence although he does have venous stasis this very well may be more of a venous leg ulcer more than anything. Nonetheless I do believe that the patient would benefit from appropriate and aggressive wound care to try to help get things under better control here. He does have history of a stroke on the left side affecting him to some degree there that he is able to stand although he does have some residual weakness. Otherwise again the patient does have chronic venous insufficiency as previously noted. His arterial studies most recently obtained showed that he had an ABI on the right of 1.16 with a TBI of 0.52 and on the left and ABI of 1.14 with  Per Day/30 Days Secured With: Kerlix Roll Sterile or Non-Sterile 6-ply 4.5x4 (yd/yd) 1 x Per Day/30 Days Discharge Instructions: Apply Kerlix as directed Secured With: Tubigrip Size D, 3x10 (in/yd) 1 x Per Day/30 Days Electronic Signature(s) Signed: 05/25/2023 2:19:13 PM By: [name] PA-C Signed: 05/25/2023 4:36:05 PM By: Midge Aver MSN RN CNS WTA Entered By: Midge Aver on 05/25/2023 06:10:34 -------------------------------------------------------------------------------- Problem List Details Patient Name: Date of Service: [name] 05/25/2023 8:30 A M Medical Record Number: [id number] Patient Account Number: [id number] [name] ([id number]) [id number] Page 9 of 16 Date of Birth/Sex: Treating RN: Aug 02, 1960 (62 y.o. 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 02/19/2023 No Yes foot 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, stage 3 10/07/2022 No Yes Inactive Problems ICD-10 Code Description Active Date Inactive Date L97.322 Non-pressure chronic ulcer of left ankle with fat layer exposed 12/06/2021 12/06/2021 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 [name] Primary Care Provider: [name] Other Clinician: Referring Provider: Treating Provider/Extender: [name] Weeks in Treatment: [address] [place] ([id number]) [id number] Page 10 of 16 Subjective Chief Complaint Information obtained from Patient Left ankle ulcer, Left dorsal foot wound, right heel History of Present Illness (HPI) 10/08/18 on evaluation today patient actually presents to our office for initial evaluation concerning wounds that he has of the bilateral lower extremities. 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 He also has bilateral venous stasis. He apparently has been homeless currently following discharge from the hospital apparently he has been placed at [facility] which is is a skilled nursing facility locally. Nonetheless fortunately he does not show any signs of infection at this time which is good news. In fact several of the wound actually appears to be showing some signs of improvement already in my pinion. There are a couple areas in the left leg in particular there likely gonna require some sharp debridement to help clear away some necrotic tissue and help with more sufficient healing. No fevers, chills, nausea, or vomiting noted at this time. 10/15/18 on evaluation today patient actually appears to be doing very well in regard to his bilateral lower extremities. He's been tolerating the dressing changes without complication. Fortunately there does not appear to be any evidence of active infection at this time which is great news. Overall I'm actually very pleased with how this has progressed in just one visits time. Readmission: 08/14/2020 upon evaluation today patient presents for re-evaluation here in our clinic. He is having issues with his left  Per Day/30 Days Secured With: Kerlix Roll Sterile or Non-Sterile 6-ply 4.5x4 (yd/yd) 1 x Per Day/30 Days Discharge Instructions: Apply Kerlix as directed Secured With: Tubigrip Size D, 3x10 (in/yd) 1 x Per Day/30 Days Electronic Signature(s) Signed: 05/25/2023 2:19:13 PM By: [name] PA-C Signed: 05/25/2023 4:36:05 PM By: Midge Aver MSN RN CNS WTA Entered By: Midge Aver on 05/25/2023 06:10:34 -------------------------------------------------------------------------------- Problem List Details Patient Name: Date of Service: [name] 05/25/2023 8:30 A M Medical Record Number: [id number] Patient Account Number: [id number] [name] ([id number]) [id number] Page 9 of 16 Date of Birth/Sex: Treating RN: Aug 02, 1960 (62 y.o. 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 02/19/2023 No Yes foot 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, stage 3 10/07/2022 No Yes Inactive Problems ICD-10 Code Description Active Date Inactive Date L97.322 Non-pressure chronic ulcer of left ankle with fat layer exposed 12/06/2021 12/06/2021 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 [name] Primary Care Provider: [name] Other Clinician: Referring Provider: Treating Provider/Extender: [name] Weeks in Treatment: [address] [place] ([id number]) [id number] Page 10 of 16 Subjective Chief Complaint Information obtained from Patient Left ankle ulcer, Left dorsal foot wound, right heel History of Present Illness (HPI) 10/08/18 on evaluation today patient actually presents to our office for initial evaluation concerning wounds that he has of the bilateral lower extremities. 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 He also has bilateral venous stasis. He apparently has been homeless currently following discharge from the hospital apparently he has been placed at [facility] which is is a skilled nursing facility locally. Nonetheless fortunately he does not show any signs of infection at this time which is good news. In fact several of the wound actually appears to be showing some signs of improvement already in my pinion. There are a couple areas in the left leg in particular there likely gonna require some sharp debridement to help clear away some necrotic tissue and help with more sufficient healing. No fevers, chills, nausea, or vomiting noted at this time. 10/15/18 on evaluation today patient actually appears to be doing very well in regard to his bilateral lower extremities. He's been tolerating the dressing changes without complication. Fortunately there does not appear to be any evidence of active infection at this time which is great news. Overall I'm actually very pleased with how this has progressed in just one visits time. Readmission: 08/14/2020 upon evaluation today patient presents for re-evaluation here in our clinic. He is having issues with his left  when he was on the Levaquin I felt like it was improving some but the concern there is QT prolongation I do not want to continue that personally further I think he needs a referral ASAP to infectious disease. I discussed that with him today. He also needs to be having this changed every day and that has not been happening at this point. That is my biggest concern here. Electronic Signature(s) Signed: 05/25/2023 9:04:18 AM By: [name] PA-C Entered By: [name] on 05/25/2023 06:04:18 -------------------------------------------------------------------------------- Physical Exam Details Patient Name: Date of Service: CRAIG, WESTERMANN 05/25/2023 8:30 A M Medical Record Number: [id number] Patient Account Number: [id number] Date of Birth/Sex: Treating RN: [birth year] (62 y.o. [name] Primary Care Provider: [name] Other Clinician: Referring Provider: Treating Provider/Extender: [name] Weeks in Treatment: 42 Constitutional Well-nourished and well-hydrated  in no acute distress. Respiratory normal breathing without difficulty. Psychiatric this patient is able to make decisions and demonstrates good insight into disease process. Alert and Oriented x 3. pleasant and cooperative. Notes Upon inspection patient's wound bed actually showed signs still having significant drainage I feel like that were have a lot of issues here based on the fact that this is not being changed daily when I see him on Mondays it is the worst that it sounds like it generally looks in general. Electronic Signature(s) Signed: 05/25/2023 9:04:32 AM By: [name] PA-C Entered By: [name] on 05/25/2023 06:04:32 -------------------------------------------------------------------------------- Physician Orders Details Patient Name: Date of Service: [name] 05/25/2023 8:30 A M Medical Record Number: [id number] Patient Account Number: [id number] ISHAMEL, [name] ([id number]) [id number].pdf Page 7 of 16 Date of Birth/Sex: Treating RN: October 01, 1960 (62 y.o. 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 Nurse Visit as needed Bathing/ Shower/ Hygiene Clean wound with Normal Saline or wound  cleanser. No tub bath. Anesthetic (Use 'Patient Medications' Section for Anesthetic Order Entry) Lidocaine applied to wound bed Off-Loading Heel suspension boot - when in bed Turn and reposition every 2 hours Medications-Please add to medication list. ntibiotics - RX renewed 05/25/2023 P.O. 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 Remove old dressing, discard into plastic bag and place into trash. Cleanse the wound with Wound Cleanser prior to applying a clean dressing using gauze sponges, not tissues or cotton balls. Do not scrub or use excessive force. Pat dry using gauze sponges, not tissue or cotton balls. Secondary Dressing: Zetuvit Plus 4x8 (in/in) 1 x Per Day/30 Days Secondary Dressing: Heel Cup 1 x Per Day/30 Days Secured With: Kerlix Roll Sterile or Non-Sterile 6-ply 4.5x4 (yd/yd) 1 x Per Day/30 Days  the Right,Dorsal Foot. The wound measures 3.5cm length x 4.5cm width x 0.2cm depth; 12.37cm^2 area and 2.474cm^3  volume. There is Fat Layer (Subcutaneous Tissue) exposed. There is a medium amount of serous drainage noted. There is small (1-33%) red, pink granulation within the wound bed. There is a small (1-33%) amount of necrotic tissue within the wound bed including Adherent Slough. Wound #22 status is Open. Original cause of wound was Pressure Injury. The date acquired was: 03/02/2023. The wound has been in treatment 8 weeks. The wound is located on the Right,Anterior Lower Leg. The wound measures 10cm length x 9.5cm width x 0.1cm depth; 74.613cm^2 area and 7.461cm^3 volume. There is Fat Layer (Subcutaneous Tissue) exposed. There is a medium amount of serous drainage noted. There is medium (34-66%) red, pink granulation within the wound bed. There is a small (1-33%) amount of necrotic tissue within the wound bed including Adherent Slough. Wound #23 status is Open. Original cause of wound was Pressure Injury. The date acquired was: 05/02/2023. The wound has been in treatment 2 weeks. The wound is located on the Right,Posterior Lower Leg. The wound measures 6cm length x 3cm width x 0.1cm depth; 14.137cm^2 area and 1.414cm^3 volume. LISA, RASK ([id number]) [id number] Page 15 of 16 There is Fat Layer (Subcutaneous Tissue) exposed. There is a medium amount of serosanguineous drainage noted. There is medium (34-66%) red, pink granulation within the wound bed. There is a small (1-33%) amount of necrotic tissue within the wound bed including Adherent Slough. 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 Based on what  I am seeing I do believe that the patient needs to have a referral to infectious disease ASAP. I will give a copy of the PCR culture I am also going to recommend that we have a copy of the orders I sent back today because the patient I did mark on their needs to have daily dressing changes along with this referral as soon as possible. 2. I am also going to recommend based on what we are seeing that we have the patient continue to monitor for any signs of infection or worsening. I do believe that he is doing a really good job here with regard to getting these changes during the week based on what he tells me but when the primary nurse that does this during the week is not there on the weekends the last 2 weeks nothing has been changed all weekend long. With the amount of drainage she has he cannot go multiple days without having this change that needs to be daily. We will see patient back for reevaluation in 1 week here in the clinic. If anything worsens or changes patient will contact our office for additional recommendations. Electronic Signature(s) Signed: 05/25/2023 9:05:36 AM By: [name] PA-C Entered By: [name] on 05/25/2023 06:05:36 -------------------------------------------------------------------------------- SuperBill Details Patient Name: Date of Service: VALJEAN, OR 05/25/2023 Medical Record Number: [id number] Patient Account Number: [id number] Date of Birth/Sex: Treating RN: Jun 27, 1961 (62 y.o. [name] Primary Care Provider: [name] Other Clinician: Referring Provider: Treating Provider/Extender: [name] Weeks in Treatment: 76 Diagnosis Coding ICD-10 Codes Code Description [phone] Chronic venous hypertension (  VIPUL, MCCLEAF ([id number]) [id number] Page 1 of 16 Visit Report for 05/25/2023 Chief Complaint Document Details Patient Name: Date of Service: LABYRON, NOHL 05/25/2023 8:30 A M Medical Record Number: [id number] Patient Account Number: [id number] Date of Birth/Sex: Treating RN: [birth year] (62 y.o. [name] Primary Care Provider: [name] Other Clinician: Referring Provider: Treating Provider/Extender: [name] Weeks in Treatment: 6 Information Obtained from: Patient Chief Complaint Left ankle ulcer, Left dorsal foot wound, right heel Electronic Signature(s) Signed: 05/25/2023 8:15:42 AM By: [name] PA-C Entered By: [name] on 05/25/2023 05:15:42 -------------------------------------------------------------------------------- HPI Details Patient Name: Date of Service: [name] 05/25/2023 8:30 A M Medical Record Number: [id number] Patient Account Number: [id number] Date of Birth/Sex: Treating RN: [birth year] (62 y.o. [name] Primary Care Provider: [name] Other Clinician: Referring Provider: Treating Provider/Extender: [name] Weeks in Treatment: 26 History of Present Illness HPI Description: 10/08/18 on evaluation today patient actually presents to our office for initial evaluation concerning wounds that he has of the bilateral lower extremities. 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 He also has bilateral venous stasis. He apparently has been homeless currently following discharge from the hospital apparently he has been placed at [facility] which is is a skilled nursing facility locally. Nonetheless fortunately he does not show any signs of infection at this time which is good news. In fact several of the wound  actually appears to be showing some signs of improvement already in my pinion. There are a couple areas in the left leg in particular there likely gonna require some sharp debridement to help clear away some necrotic tissue and help with more sufficient healing. No fevers, chills, nausea, or vomiting noted at this time. 10/15/18 on evaluation today patient actually appears to be doing very well in regard to his bilateral lower extremities. He's been tolerating the dressing changes without complication. Fortunately there does not appear to be any evidence of active infection at this time which is great news. Overall I'm actually very pleased with how this has progressed in just one visits time. Readmission: 08/14/2020 upon evaluation today patient presents for re-evaluation here in our clinic. He is having issues with his left ankle region as well as his right toe and his right heel. He tells me that the toe and heel actually began as a area that was itching that he was scratching and then subsequently opened up into wounds. These may have been abscess areas I presume based on what I am seeing currently. With regard to his left ankle region he tells me this was a similar type RIGOVERTO, STONEBARGER ([id number]) [id number] Page 2 of 16 occurrence although he does have venous stasis this very well may be more of a venous leg ulcer more than anything. Nonetheless I do believe that the patient would benefit from appropriate and aggressive wound care to try to help get things under better control here. He does have history of a stroke on the left side affecting him to some degree there that he is able to stand although he does have some residual weakness. Otherwise again the patient does have chronic venous insufficiency as previously noted. His arterial studies most recently obtained showed that he had an ABI on the right of 1.16 with a TBI of 0.52 and on the left and ABI of 1.14 with  VIPUL, MCCLEAF ([id number]) [id number] Page 1 of 16 Visit Report for 05/25/2023 Chief Complaint Document Details Patient Name: Date of Service: LABYRON, NOHL 05/25/2023 8:30 A M Medical Record Number: [id number] Patient Account Number: [id number] Date of Birth/Sex: Treating RN: [birth year] (62 y.o. [name] Primary Care Provider: [name] Other Clinician: Referring Provider: Treating Provider/Extender: [name] Weeks in Treatment: 6 Information Obtained from: Patient Chief Complaint Left ankle ulcer, Left dorsal foot wound, right heel Electronic Signature(s) Signed: 05/25/2023 8:15:42 AM By: [name] PA-C Entered By: [name] on 05/25/2023 05:15:42 -------------------------------------------------------------------------------- HPI Details Patient Name: Date of Service: [name] 05/25/2023 8:30 A M Medical Record Number: [id number] Patient Account Number: [id number] Date of Birth/Sex: Treating RN: [birth year] (62 y.o. [name] Primary Care Provider: [name] Other Clinician: Referring Provider: Treating Provider/Extender: [name] Weeks in Treatment: 26 History of Present Illness HPI Description: 10/08/18 on evaluation today patient actually presents to our office for initial evaluation concerning wounds that he has of the bilateral lower extremities. 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 He also has bilateral venous stasis. He apparently has been homeless currently following discharge from the hospital apparently he has been placed at [facility] which is is a skilled nursing facility locally. Nonetheless fortunately he does not show any signs of infection at this time which is good news. In fact several of the wound  actually appears to be showing some signs of improvement already in my pinion. There are a couple areas in the left leg in particular there likely gonna require some sharp debridement to help clear away some necrotic tissue and help with more sufficient healing. No fevers, chills, nausea, or vomiting noted at this time. 10/15/18 on evaluation today patient actually appears to be doing very well in regard to his bilateral lower extremities. He's been tolerating the dressing changes without complication. Fortunately there does not appear to be any evidence of active infection at this time which is great news. Overall I'm actually very pleased with how this has progressed in just one visits time. Readmission: 08/14/2020 upon evaluation today patient presents for re-evaluation here in our clinic. He is having issues with his left ankle region as well as his right toe and his right heel. He tells me that the toe and heel actually began as a area that was itching that he was scratching and then subsequently opened up into wounds. These may have been abscess areas I presume based on what I am seeing currently. With regard to his left ankle region he tells me this was a similar type RIGOVERTO, STONEBARGER ([id number]) [id number] Page 2 of 16 occurrence although he does have venous stasis this very well may be more of a venous leg ulcer more than anything. Nonetheless I do believe that the patient would benefit from appropriate and aggressive wound care to try to help get things under better control here. He does have history of a stroke on the left side affecting him to some degree there that he is able to stand although he does have some residual weakness. Otherwise again the patient does have chronic venous insufficiency as previously noted. His arterial studies most recently obtained showed that he had an ABI on the right of 1.16 with a TBI of 0.52 and on the left and ABI of 1.14 with  VIPUL, MCCLEAF ([id number]) [id number] Page 1 of 16 Visit Report for 05/25/2023 Chief Complaint Document Details Patient Name: Date of Service: LABYRON, NOHL 05/25/2023 8:30 A M Medical Record Number: [id number] Patient Account Number: [id number] Date of Birth/Sex: Treating RN: [birth year] (62 y.o. [name] Primary Care Provider: [name] Other Clinician: Referring Provider: Treating Provider/Extender: [name] Weeks in Treatment: 6 Information Obtained from: Patient Chief Complaint Left ankle ulcer, Left dorsal foot wound, right heel Electronic Signature(s) Signed: 05/25/2023 8:15:42 AM By: [name] PA-C Entered By: [name] on 05/25/2023 05:15:42 -------------------------------------------------------------------------------- HPI Details Patient Name: Date of Service: [name] 05/25/2023 8:30 A M Medical Record Number: [id number] Patient Account Number: [id number] Date of Birth/Sex: Treating RN: [birth year] (62 y.o. [name] Primary Care Provider: [name] Other Clinician: Referring Provider: Treating Provider/Extender: [name] Weeks in Treatment: 26 History of Present Illness HPI Description: 10/08/18 on evaluation today patient actually presents to our office for initial evaluation concerning wounds that he has of the bilateral lower extremities. 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 He also has bilateral venous stasis. He apparently has been homeless currently following discharge from the hospital apparently he has been placed at [facility] which is is a skilled nursing facility locally. Nonetheless fortunately he does not show any signs of infection at this time which is good news. In fact several of the wound  actually appears to be showing some signs of improvement already in my pinion. There are a couple areas in the left leg in particular there likely gonna require some sharp debridement to help clear away some necrotic tissue and help with more sufficient healing. No fevers, chills, nausea, or vomiting noted at this time. 10/15/18 on evaluation today patient actually appears to be doing very well in regard to his bilateral lower extremities. He's been tolerating the dressing changes without complication. Fortunately there does not appear to be any evidence of active infection at this time which is great news. Overall I'm actually very pleased with how this has progressed in just one visits time. Readmission: 08/14/2020 upon evaluation today patient presents for re-evaluation here in our clinic. He is having issues with his left ankle region as well as his right toe and his right heel. He tells me that the toe and heel actually began as a area that was itching that he was scratching and then subsequently opened up into wounds. These may have been abscess areas I presume based on what I am seeing currently. With regard to his left ankle region he tells me this was a similar type RIGOVERTO, STONEBARGER ([id number]) [id number] Page 2 of 16 occurrence although he does have venous stasis this very well may be more of a venous leg ulcer more than anything. Nonetheless I do believe that the patient would benefit from appropriate and aggressive wound care to try to help get things under better control here. He does have history of a stroke on the left side affecting him to some degree there that he is able to stand although he does have some residual weakness. Otherwise again the patient does have chronic venous insufficiency as previously noted. His arterial studies most recently obtained showed that he had an ABI on the right of 1.16 with a TBI of 0.52 and on the left and ABI of 1.14 with  the Right,Dorsal Foot. The wound measures 3.5cm length x 4.5cm width x 0.2cm depth; 12.37cm^2 area and 2.474cm^3  volume. There is Fat Layer (Subcutaneous Tissue) exposed. There is a medium amount of serous drainage noted. There is small (1-33%) red, pink granulation within the wound bed. There is a small (1-33%) amount of necrotic tissue within the wound bed including Adherent Slough. Wound #22 status is Open. Original cause of wound was Pressure Injury. The date acquired was: 03/02/2023. The wound has been in treatment 8 weeks. The wound is located on the Right,Anterior Lower Leg. The wound measures 10cm length x 9.5cm width x 0.1cm depth; 74.613cm^2 area and 7.461cm^3 volume. There is Fat Layer (Subcutaneous Tissue) exposed. There is a medium amount of serous drainage noted. There is medium (34-66%) red, pink granulation within the wound bed. There is a small (1-33%) amount of necrotic tissue within the wound bed including Adherent Slough. Wound #23 status is Open. Original cause of wound was Pressure Injury. The date acquired was: 05/02/2023. The wound has been in treatment 2 weeks. The wound is located on the Right,Posterior Lower Leg. The wound measures 6cm length x 3cm width x 0.1cm depth; 14.137cm^2 area and 1.414cm^3 volume. LISA, RASK ([id number]) [id number] Page 15 of 16 There is Fat Layer (Subcutaneous Tissue) exposed. There is a medium amount of serosanguineous drainage noted. There is medium (34-66%) red, pink granulation within the wound bed. There is a small (1-33%) amount of necrotic tissue within the wound bed including Adherent Slough. 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 Based on what  I am seeing I do believe that the patient needs to have a referral to infectious disease ASAP. I will give a copy of the PCR culture I am also going to recommend that we have a copy of the orders I sent back today because the patient I did mark on their needs to have daily dressing changes along with this referral as soon as possible. 2. I am also going to recommend based on what we are seeing that we have the patient continue to monitor for any signs of infection or worsening. I do believe that he is doing a really good job here with regard to getting these changes during the week based on what he tells me but when the primary nurse that does this during the week is not there on the weekends the last 2 weeks nothing has been changed all weekend long. With the amount of drainage she has he cannot go multiple days without having this change that needs to be daily. We will see patient back for reevaluation in 1 week here in the clinic. If anything worsens or changes patient will contact our office for additional recommendations. Electronic Signature(s) Signed: 05/25/2023 9:05:36 AM By: [name] PA-C Entered By: [name] on 05/25/2023 06:05:36 -------------------------------------------------------------------------------- SuperBill Details Patient Name: Date of Service: VALJEAN, OR 05/25/2023 Medical Record Number: [id number] Patient Account Number: [id number] Date of Birth/Sex: Treating RN: Jun 27, 1961 (62 y.o. [name] Primary Care Provider: [name] Other Clinician: Referring Provider: Treating Provider/Extender: [name] Weeks in Treatment: 76 Diagnosis Coding ICD-10 Codes Code Description [phone] Chronic venous hypertension (  when he was on the Levaquin I felt like it was improving some but the concern there is QT prolongation I do not want to continue that personally further I think he needs a referral ASAP to infectious disease. I discussed that with him today. He also needs to be having this changed every day and that has not been happening at this point. That is my biggest concern here. Electronic Signature(s) Signed: 05/25/2023 9:04:18 AM By: [name] PA-C Entered By: [name] on 05/25/2023 06:04:18 -------------------------------------------------------------------------------- Physical Exam Details Patient Name: Date of Service: CRAIG, WESTERMANN 05/25/2023 8:30 A M Medical Record Number: [id number] Patient Account Number: [id number] Date of Birth/Sex: Treating RN: [birth year] (62 y.o. [name] Primary Care Provider: [name] Other Clinician: Referring Provider: Treating Provider/Extender: [name] Weeks in Treatment: 42 Constitutional Well-nourished and well-hydrated  in no acute distress. Respiratory normal breathing without difficulty. Psychiatric this patient is able to make decisions and demonstrates good insight into disease process. Alert and Oriented x 3. pleasant and cooperative. Notes Upon inspection patient's wound bed actually showed signs still having significant drainage I feel like that were have a lot of issues here based on the fact that this is not being changed daily when I see him on Mondays it is the worst that it sounds like it generally looks in general. Electronic Signature(s) Signed: 05/25/2023 9:04:32 AM By: [name] PA-C Entered By: [name] on 05/25/2023 06:04:32 -------------------------------------------------------------------------------- Physician Orders Details Patient Name: Date of Service: [name] 05/25/2023 8:30 A M Medical Record Number: [id number] Patient Account Number: [id number] ISHAMEL, [name] ([id number]) [phone].pdf Page 7 of 16 Date of Birth/Sex: Treating RN: October 01, 1960 (62 y.o. 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 Nurse Visit as needed Bathing/ Shower/ Hygiene Clean wound with Normal Saline or wound  cleanser. No tub bath. Anesthetic (Use 'Patient Medications' Section for Anesthetic Order Entry) Lidocaine applied to wound bed Off-Loading Heel suspension boot - when in bed Turn and reposition every 2 hours Medications-Please add to medication list. ntibiotics - RX renewed 05/25/2023 P.O. 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 Remove old dressing, discard into plastic bag and place into trash. Cleanse the wound with Wound Cleanser prior to applying a clean dressing using gauze sponges, not tissues or cotton balls. Do not scrub or use excessive force. Pat dry using gauze sponges, not tissue or cotton balls. Secondary Dressing: Zetuvit Plus 4x8 (in/in) 1 x Per Day/30 Days Secondary Dressing: Heel Cup 1 x Per Day/30 Days Secured With: Kerlix Roll Sterile or Non-Sterile 6-ply 4.5x4 (yd/yd) 1 x Per Day/30 Days  when he was on the Levaquin I felt like it was improving some but the concern there is QT prolongation I do not want to continue that personally further I think he needs a referral ASAP to infectious disease. I discussed that with him today. He also needs to be having this changed every day and that has not been happening at this point. That is my biggest concern here. Electronic Signature(s) Signed: 05/25/2023 9:04:18 AM By: [name] PA-C Entered By: [name] on 05/25/2023 06:04:18 -------------------------------------------------------------------------------- Physical Exam Details Patient Name: Date of Service: CRAIG, WESTERMANN 05/25/2023 8:30 A M Medical Record Number: [id number] Patient Account Number: [id number] Date of Birth/Sex: Treating RN: [birth year] (62 y.o. [name] Primary Care Provider: [name] Other Clinician: Referring Provider: Treating Provider/Extender: [name] Weeks in Treatment: 42 Constitutional Well-nourished and well-hydrated  in no acute distress. Respiratory normal breathing without difficulty. Psychiatric this patient is able to make decisions and demonstrates good insight into disease process. Alert and Oriented x 3. pleasant and cooperative. Notes Upon inspection patient's wound bed actually showed signs still having significant drainage I feel like that were have a lot of issues here based on the fact that this is not being changed daily when I see him on Mondays it is the worst that it sounds like it generally looks in general. Electronic Signature(s) Signed: 05/25/2023 9:04:32 AM By: [name] PA-C Entered By: [name] on 05/25/2023 06:04:32 -------------------------------------------------------------------------------- Physician Orders Details Patient Name: Date of Service: [name] 05/25/2023 8:30 A M Medical Record Number: [id number] Patient Account Number: [id number] ISHAMEL, [name] ([id number]) [phone].pdf Page 7 of 16 Date of Birth/Sex: Treating RN: October 01, 1960 (62 y.o. 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 Nurse Visit as needed Bathing/ Shower/ Hygiene Clean wound with Normal Saline or wound  cleanser. No tub bath. Anesthetic (Use 'Patient Medications' Section for Anesthetic Order Entry) Lidocaine applied to wound bed Off-Loading Heel suspension boot - when in bed Turn and reposition every 2 hours Medications-Please add to medication list. ntibiotics - RX renewed 05/25/2023 P.O. 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 Remove old dressing, discard into plastic bag and place into trash. Cleanse the wound with Wound Cleanser prior to applying a clean dressing using gauze sponges, not tissues or cotton balls. Do not scrub or use excessive force. Pat dry using gauze sponges, not tissue or cotton balls. Secondary Dressing: Zetuvit Plus 4x8 (in/in) 1 x Per Day/30 Days Secondary Dressing: Heel Cup 1 x Per Day/30 Days Secured With: Kerlix Roll Sterile or Non-Sterile 6-ply 4.5x4 (yd/yd) 1 x Per Day/30 Days  when he was on the Levaquin I felt like it was improving some but the concern there is QT prolongation I do not want to continue that personally further I think he needs a referral ASAP to infectious disease. I discussed that with him today. He also needs to be having this changed every day and that has not been happening at this point. That is my biggest concern here. Electronic Signature(s) Signed: 05/25/2023 9:04:18 AM By: [name] PA-C Entered By: [name] on 05/25/2023 06:04:18 -------------------------------------------------------------------------------- Physical Exam Details Patient Name: Date of Service: CRAIG, WESTERMANN 05/25/2023 8:30 A M Medical Record Number: [id number] Patient Account Number: [id number] Date of Birth/Sex: Treating RN: [birth year] (62 y.o. [name] Primary Care Provider: [name] Other Clinician: Referring Provider: Treating Provider/Extender: [name] Weeks in Treatment: 42 Constitutional Well-nourished and well-hydrated  in no acute distress. Respiratory normal breathing without difficulty. Psychiatric this patient is able to make decisions and demonstrates good insight into disease process. Alert and Oriented x 3. pleasant and cooperative. Notes Upon inspection patient's wound bed actually showed signs still having significant drainage I feel like that were have a lot of issues here based on the fact that this is not being changed daily when I see him on Mondays it is the worst that it sounds like it generally looks in general. Electronic Signature(s) Signed: 05/25/2023 9:04:32 AM By: [name] PA-C Entered By: [name] on 05/25/2023 06:04:32 -------------------------------------------------------------------------------- Physician Orders Details Patient Name: Date of Service: [name] 05/25/2023 8:30 A M Medical Record Number: [id number] Patient Account Number: [id number] ISHAMEL, [name] ([id number]) [phone].pdf Page 7 of 16 Date of Birth/Sex: Treating RN: October 01, 1960 (62 y.o. 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 Nurse Visit as needed Bathing/ Shower/ Hygiene Clean wound with Normal Saline or wound  cleanser. No tub bath. Anesthetic (Use 'Patient Medications' Section for Anesthetic Order Entry) Lidocaine applied to wound bed Off-Loading Heel suspension boot - when in bed Turn and reposition every 2 hours Medications-Please add to medication list. ntibiotics - RX renewed 05/25/2023 P.O. 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 Remove old dressing, discard into plastic bag and place into trash. Cleanse the wound with Wound Cleanser prior to applying a clean dressing using gauze sponges, not tissues or cotton balls. Do not scrub or use excessive force. Pat dry using gauze sponges, not tissue or cotton balls. Secondary Dressing: Zetuvit Plus 4x8 (in/in) 1 x Per Day/30 Days Secondary Dressing: Heel Cup 1 x Per Day/30 Days Secured With: Kerlix Roll Sterile or Non-Sterile 6-ply 4.5x4 (yd/yd) 1 x Per Day/30 Days  when he was on the Levaquin I felt like it was improving some but the concern there is QT prolongation I do not want to continue that personally further I think he needs a referral ASAP to infectious disease. I discussed that with him today. He also needs to be having this changed every day and that has not been happening at this point. That is my biggest concern here. Electronic Signature(s) Signed: 05/25/2023 9:04:18 AM By: [name] PA-C Entered By: [name] on 05/25/2023 06:04:18 -------------------------------------------------------------------------------- Physical Exam Details Patient Name: Date of Service: CRAIG, WESTERMANN 05/25/2023 8:30 A M Medical Record Number: [id number] Patient Account Number: [id number] Date of Birth/Sex: Treating RN: [birth year] (62 y.o. [name] Primary Care Provider: [name] Other Clinician: Referring Provider: Treating Provider/Extender: [name] Weeks in Treatment: 42 Constitutional Well-nourished and well-hydrated  in no acute distress. Respiratory normal breathing without difficulty. Psychiatric this patient is able to make decisions and demonstrates good insight into disease process. Alert and Oriented x 3. pleasant and cooperative. Notes Upon inspection patient's wound bed actually showed signs still having significant drainage I feel like that were have a lot of issues here based on the fact that this is not being changed daily when I see him on Mondays it is the worst that it sounds like it generally looks in general. Electronic Signature(s) Signed: 05/25/2023 9:04:32 AM By: [name] PA-C Entered By: [name] on 05/25/2023 06:04:32 -------------------------------------------------------------------------------- Physician Orders Details Patient Name: Date of Service: [name] 05/25/2023 8:30 A M Medical Record Number: [id number] Patient Account Number: [id number] ISHAMEL, [name] ([id number]) [phone].pdf Page 7 of 16 Date of Birth/Sex: Treating RN: October 01, 1960 (62 y.o. 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 Nurse Visit as needed Bathing/ Shower/ Hygiene Clean wound with Normal Saline or wound  cleanser. No tub bath. Anesthetic (Use 'Patient Medications' Section for Anesthetic Order Entry) Lidocaine applied to wound bed Off-Loading Heel suspension boot - when in bed Turn and reposition every 2 hours Medications-Please add to medication list. ntibiotics - RX renewed 05/25/2023 P.O. 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 Remove old dressing, discard into plastic bag and place into trash. Cleanse the wound with Wound Cleanser prior to applying a clean dressing using gauze sponges, not tissues or cotton balls. Do not scrub or use excessive force. Pat dry using gauze sponges, not tissue or cotton balls. Secondary Dressing: Zetuvit Plus 4x8 (in/in) 1 x Per Day/30 Days Secondary Dressing: Heel Cup 1 x Per Day/30 Days Secured With: Kerlix Roll Sterile or Non-Sterile 6-ply 4.5x4 (yd/yd) 1 x Per Day/30 Days  the Right,Dorsal Foot. The wound measures 3.5cm length x 4.5cm width x 0.2cm depth; 12.37cm^2 area and 2.474cm^3  volume. There is Fat Layer (Subcutaneous Tissue) exposed. There is a medium amount of serous drainage noted. There is small (1-33%) red, pink granulation within the wound bed. There is a small (1-33%) amount of necrotic tissue within the wound bed including Adherent Slough. Wound #22 status is Open. Original cause of wound was Pressure Injury. The date acquired was: 03/02/2023. The wound has been in treatment 8 weeks. The wound is located on the Right,Anterior Lower Leg. The wound measures 10cm length x 9.5cm width x 0.1cm depth; 74.613cm^2 area and 7.461cm^3 volume. There is Fat Layer (Subcutaneous Tissue) exposed. There is a medium amount of serous drainage noted. There is medium (34-66%) red, pink granulation within the wound bed. There is a small (1-33%) amount of necrotic tissue within the wound bed including Adherent Slough. Wound #23 status is Open. Original cause of wound was Pressure Injury. The date acquired was: 05/02/2023. The wound has been in treatment 2 weeks. The wound is located on the Right,Posterior Lower Leg. The wound measures 6cm length x 3cm width x 0.1cm depth; 14.137cm^2 area and 1.414cm^3 volume. LISA, RASK ([id number]) [id number] Page 15 of 16 There is Fat Layer (Subcutaneous Tissue) exposed. There is a medium amount of serosanguineous drainage noted. There is medium (34-66%) red, pink granulation within the wound bed. There is a small (1-33%) amount of necrotic tissue within the wound bed including Adherent Slough. 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 Based on what  I am seeing I do believe that the patient needs to have a referral to infectious disease ASAP. I will give a copy of the PCR culture I am also going to recommend that we have a copy of the orders I sent back today because the patient I did mark on their needs to have daily dressing changes along with this referral as soon as possible. 2. I am also going to recommend based on what we are seeing that we have the patient continue to monitor for any signs of infection or worsening. I do believe that he is doing a really good job here with regard to getting these changes during the week based on what he tells me but when the primary nurse that does this during the week is not there on the weekends the last 2 weeks nothing has been changed all weekend long. With the amount of drainage she has he cannot go multiple days without having this change that needs to be daily. We will see patient back for reevaluation in 1 week here in the clinic. If anything worsens or changes patient will contact our office for additional recommendations. Electronic Signature(s) Signed: 05/25/2023 9:05:36 AM By: [name] PA-C Entered By: [name] on 05/25/2023 06:05:36 -------------------------------------------------------------------------------- SuperBill Details Patient Name: Date of Service: VALJEAN, OR 05/25/2023 Medical Record Number: [id number] Patient Account Number: [id number] Date of Birth/Sex: Treating RN: Jun 27, 1961 (62 y.o. [name] Primary Care Provider: [name] Other Clinician: Referring Provider: Treating Provider/Extender: [name] Weeks in Treatment: 76 Diagnosis Coding ICD-10 Codes Code Description [phone] Chronic venous hypertension (  the Right,Dorsal Foot. The wound measures 3.5cm length x 4.5cm width x 0.2cm depth; 12.37cm^2 area and 2.474cm^3  volume. There is Fat Layer (Subcutaneous Tissue) exposed. There is a medium amount of serous drainage noted. There is small (1-33%) red, pink granulation within the wound bed. There is a small (1-33%) amount of necrotic tissue within the wound bed including Adherent Slough. Wound #22 status is Open. Original cause of wound was Pressure Injury. The date acquired was: 03/02/2023. The wound has been in treatment 8 weeks. The wound is located on the Right,Anterior Lower Leg. The wound measures 10cm length x 9.5cm width x 0.1cm depth; 74.613cm^2 area and 7.461cm^3 volume. There is Fat Layer (Subcutaneous Tissue) exposed. There is a medium amount of serous drainage noted. There is medium (34-66%) red, pink granulation within the wound bed. There is a small (1-33%) amount of necrotic tissue within the wound bed including Adherent Slough. Wound #23 status is Open. Original cause of wound was Pressure Injury. The date acquired was: 05/02/2023. The wound has been in treatment 2 weeks. The wound is located on the Right,Posterior Lower Leg. The wound measures 6cm length x 3cm width x 0.1cm depth; 14.137cm^2 area and 1.414cm^3 volume. LISA, RASK ([id number]) [id number] Page 15 of 16 There is Fat Layer (Subcutaneous Tissue) exposed. There is a medium amount of serosanguineous drainage noted. There is medium (34-66%) red, pink granulation within the wound bed. There is a small (1-33%) amount of necrotic tissue within the wound bed including Adherent Slough. 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 Based on what  I am seeing I do believe that the patient needs to have a referral to infectious disease ASAP. I will give a copy of the PCR culture I am also going to recommend that we have a copy of the orders I sent back today because the patient I did mark on their needs to have daily dressing changes along with this referral as soon as possible. 2. I am also going to recommend based on what we are seeing that we have the patient continue to monitor for any signs of infection or worsening. I do believe that he is doing a really good job here with regard to getting these changes during the week based on what he tells me but when the primary nurse that does this during the week is not there on the weekends the last 2 weeks nothing has been changed all weekend long. With the amount of drainage she has he cannot go multiple days without having this change that needs to be daily. We will see patient back for reevaluation in 1 week here in the clinic. If anything worsens or changes patient will contact our office for additional recommendations. Electronic Signature(s) Signed: 05/25/2023 9:05:36 AM By: [name] PA-C Entered By: [name] on 05/25/2023 06:05:36 -------------------------------------------------------------------------------- SuperBill Details Patient Name: Date of Service: VALJEAN, OR 05/25/2023 Medical Record Number: [id number] Patient Account Number: [id number] Date of Birth/Sex: Treating RN: Jun 27, 1961 (62 y.o. [name] Primary Care Provider: [name] Other Clinician: Referring Provider: Treating Provider/Extender: [name] Weeks in Treatment: 76 Diagnosis Coding ICD-10 Codes Code Description [phone] Chronic venous hypertension (  Per Day/30 Days Secured With: Kerlix Roll Sterile or Non-Sterile 6-ply 4.5x4 (yd/yd) 1 x Per Day/30 Days Discharge Instructions: Apply Kerlix as directed Secured With: Tubigrip Size D, 3x10 (in/yd) 1 x Per Day/30 Days Electronic Signature(s) Signed: 05/25/2023 2:19:13 PM By: [name] PA-C Signed: 05/25/2023 4:36:05 PM By: Midge Aver MSN RN CNS WTA Entered By: Midge Aver on 05/25/2023 06:10:34 -------------------------------------------------------------------------------- Problem List Details Patient Name: Date of Service: [name] 05/25/2023 8:30 A M Medical Record Number: [id number] Patient Account Number: [id number] [name] ([id number]) [id number] Page 9 of 16 Date of Birth/Sex: Treating RN: Aug 02, 1960 (62 y.o. 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 02/19/2023 No Yes foot 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, stage 3 10/07/2022 No Yes Inactive Problems ICD-10 Code Description Active Date Inactive Date L97.322 Non-pressure chronic ulcer of left ankle with fat layer exposed 12/06/2021 12/06/2021 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 [name] Primary Care Provider: [name] Other Clinician: Referring Provider: Treating Provider/Extender: [name] Weeks in Treatment: [address] [place] ([id number]) [id number] Page 10 of 16 Subjective Chief Complaint Information obtained from Patient Left ankle ulcer, Left dorsal foot wound, right heel History of Present Illness (HPI) 10/08/18 on evaluation today patient actually presents to our office for initial evaluation concerning wounds that he has of the bilateral lower extremities. 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 He also has bilateral venous stasis. He apparently has been homeless currently following discharge from the hospital apparently he has been placed at [facility] which is is a skilled nursing facility locally. Nonetheless fortunately he does not show any signs of infection at this time which is good news. In fact several of the wound actually appears to be showing some signs of improvement already in my pinion. There are a couple areas in the left leg in particular there likely gonna require some sharp debridement to help clear away some necrotic tissue and help with more sufficient healing. No fevers, chills, nausea, or vomiting noted at this time. 10/15/18 on evaluation today patient actually appears to be doing very well in regard to his bilateral lower extremities. He's been tolerating the dressing changes without complication. Fortunately there does not appear to be any evidence of active infection at this time which is great news. Overall I'm actually very pleased with how this has progressed in just one visits time. Readmission: 08/14/2020 upon evaluation today patient presents for re-evaluation here in our clinic. He is having issues with his left  Per Day/30 Days Secured With: Kerlix Roll Sterile or Non-Sterile 6-ply 4.5x4 (yd/yd) 1 x Per Day/30 Days Discharge Instructions: Apply Kerlix as directed Secured With: Tubigrip Size D, 3x10 (in/yd) 1 x Per Day/30 Days Electronic Signature(s) Signed: 05/25/2023 2:19:13 PM By: [name] PA-C Signed: 05/25/2023 4:36:05 PM By: Midge Aver MSN RN CNS WTA Entered By: Midge Aver on 05/25/2023 06:10:34 -------------------------------------------------------------------------------- Problem List Details Patient Name: Date of Service: [name] 05/25/2023 8:30 A M Medical Record Number: [id number] Patient Account Number: [id number] [name] ([id number]) [id number] Page 9 of 16 Date of Birth/Sex: Treating RN: Aug 02, 1960 (62 y.o. 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 02/19/2023 No Yes foot 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, stage 3 10/07/2022 No Yes Inactive Problems ICD-10 Code Description Active Date Inactive Date L97.322 Non-pressure chronic ulcer of left ankle with fat layer exposed 12/06/2021 12/06/2021 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 [name] Primary Care Provider: [name] Other Clinician: Referring Provider: Treating Provider/Extender: [name] Weeks in Treatment: [address] [place] ([id number]) [id number] Page 10 of 16 Subjective Chief Complaint Information obtained from Patient Left ankle ulcer, Left dorsal foot wound, right heel History of Present Illness (HPI) 10/08/18 on evaluation today patient actually presents to our office for initial evaluation concerning wounds that he has of the bilateral lower extremities. 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 He also has bilateral venous stasis. He apparently has been homeless currently following discharge from the hospital apparently he has been placed at [facility] which is is a skilled nursing facility locally. Nonetheless fortunately he does not show any signs of infection at this time which is good news. In fact several of the wound actually appears to be showing some signs of improvement already in my pinion. There are a couple areas in the left leg in particular there likely gonna require some sharp debridement to help clear away some necrotic tissue and help with more sufficient healing. No fevers, chills, nausea, or vomiting noted at this time. 10/15/18 on evaluation today patient actually appears to be doing very well in regard to his bilateral lower extremities. He's been tolerating the dressing changes without complication. Fortunately there does not appear to be any evidence of active infection at this time which is great news. Overall I'm actually very pleased with how this has progressed in just one visits time. Readmission: 08/14/2020 upon evaluation today patient presents for re-evaluation here in our clinic. He is having issues with his left  VIPUL, MCCLEAF ([id number]) [id number] Page 1 of 16 Visit Report for 05/25/2023 Chief Complaint Document Details Patient Name: Date of Service: LABYRON, NOHL 05/25/2023 8:30 A M Medical Record Number: [id number] Patient Account Number: [id number] Date of Birth/Sex: Treating RN: [birth year] (62 y.o. [name] Primary Care Provider: [name] Other Clinician: Referring Provider: Treating Provider/Extender: [name] Weeks in Treatment: 6 Information Obtained from: Patient Chief Complaint Left ankle ulcer, Left dorsal foot wound, right heel Electronic Signature(s) Signed: 05/25/2023 8:15:42 AM By: [name] PA-C Entered By: [name] on 05/25/2023 05:15:42 -------------------------------------------------------------------------------- HPI Details Patient Name: Date of Service: [name] 05/25/2023 8:30 A M Medical Record Number: [id number] Patient Account Number: [id number] Date of Birth/Sex: Treating RN: [birth year] (62 y.o. [name] Primary Care Provider: [name] Other Clinician: Referring Provider: Treating Provider/Extender: [name] Weeks in Treatment: 26 History of Present Illness HPI Description: 10/08/18 on evaluation today patient actually presents to our office for initial evaluation concerning wounds that he has of the bilateral lower extremities. 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 He also has bilateral venous stasis. He apparently has been homeless currently following discharge from the hospital apparently he has been placed at [facility] which is is a skilled nursing facility locally. Nonetheless fortunately he does not show any signs of infection at this time which is good news. In fact several of the wound  actually appears to be showing some signs of improvement already in my pinion. There are a couple areas in the left leg in particular there likely gonna require some sharp debridement to help clear away some necrotic tissue and help with more sufficient healing. No fevers, chills, nausea, or vomiting noted at this time. 10/15/18 on evaluation today patient actually appears to be doing very well in regard to his bilateral lower extremities. He's been tolerating the dressing changes without complication. Fortunately there does not appear to be any evidence of active infection at this time which is great news. Overall I'm actually very pleased with how this has progressed in just one visits time. Readmission: 08/14/2020 upon evaluation today patient presents for re-evaluation here in our clinic. He is having issues with his left ankle region as well as his right toe and his right heel. He tells me that the toe and heel actually began as a area that was itching that he was scratching and then subsequently opened up into wounds. These may have been abscess areas I presume based on what I am seeing currently. With regard to his left ankle region he tells me this was a similar type RIGOVERTO, STONEBARGER ([id number]) [id number] Page 2 of 16 occurrence although he does have venous stasis this very well may be more of a venous leg ulcer more than anything. Nonetheless I do believe that the patient would benefit from appropriate and aggressive wound care to try to help get things under better control here. He does have history of a stroke on the left side affecting him to some degree there that he is able to stand although he does have some residual weakness. Otherwise again the patient does have chronic venous insufficiency as previously noted. His arterial studies most recently obtained showed that he had an ABI on the right of 1.16 with a TBI of 0.52 and on the left and ABI of 1.14 with  VIPUL, MCCLEAF ([id number]) [id number] Page 1 of 16 Visit Report for 05/25/2023 Chief Complaint Document Details Patient Name: Date of Service: LABYRON, NOHL 05/25/2023 8:30 A M Medical Record Number: [id number] Patient Account Number: [id number] Date of Birth/Sex: Treating RN: [birth year] (62 y.o. [name] Primary Care Provider: [name] Other Clinician: Referring Provider: Treating Provider/Extender: [name] Weeks in Treatment: 6 Information Obtained from: Patient Chief Complaint Left ankle ulcer, Left dorsal foot wound, right heel Electronic Signature(s) Signed: 05/25/2023 8:15:42 AM By: [name] PA-C Entered By: [name] on 05/25/2023 05:15:42 -------------------------------------------------------------------------------- HPI Details Patient Name: Date of Service: [name] 05/25/2023 8:30 A M Medical Record Number: [id number] Patient Account Number: [id number] Date of Birth/Sex: Treating RN: [birth year] (62 y.o. [name] Primary Care Provider: [name] Other Clinician: Referring Provider: Treating Provider/Extender: [name] Weeks in Treatment: 26 History of Present Illness HPI Description: 10/08/18 on evaluation today patient actually presents to our office for initial evaluation concerning wounds that he has of the bilateral lower extremities. 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 He also has bilateral venous stasis. He apparently has been homeless currently following discharge from the hospital apparently he has been placed at [facility] which is is a skilled nursing facility locally. Nonetheless fortunately he does not show any signs of infection at this time which is good news. In fact several of the wound  actually appears to be showing some signs of improvement already in my pinion. There are a couple areas in the left leg in particular there likely gonna require some sharp debridement to help clear away some necrotic tissue and help with more sufficient healing. No fevers, chills, nausea, or vomiting noted at this time. 10/15/18 on evaluation today patient actually appears to be doing very well in regard to his bilateral lower extremities. He's been tolerating the dressing changes without complication. Fortunately there does not appear to be any evidence of active infection at this time which is great news. Overall I'm actually very pleased with how this has progressed in just one visits time. Readmission: 08/14/2020 upon evaluation today patient presents for re-evaluation here in our clinic. He is having issues with his left ankle region as well as his right toe and his right heel. He tells me that the toe and heel actually began as a area that was itching that he was scratching and then subsequently opened up into wounds. These may have been abscess areas I presume based on what I am seeing currently. With regard to his left ankle region he tells me this was a similar type RIGOVERTO, STONEBARGER ([id number]) [id number] Page 2 of 16 occurrence although he does have venous stasis this very well may be more of a venous leg ulcer more than anything. Nonetheless I do believe that the patient would benefit from appropriate and aggressive wound care to try to help get things under better control here. He does have history of a stroke on the left side affecting him to some degree there that he is able to stand although he does have some residual weakness. Otherwise again the patient does have chronic venous insufficiency as previously noted. His arterial studies most recently obtained showed that he had an ABI on the right of 1.16 with a TBI of 0.52 and on the left and ABI of 1.14 with

## 2023-06-01 ENCOUNTER — Encounter: Payer: Medicaid Other | Attending: Physician Assistant

## 2023-06-01 DIAGNOSIS — I2510 Atherosclerotic heart disease of native coronary artery without angina pectoris: Secondary | ICD-10-CM | POA: Diagnosis not present

## 2023-06-01 DIAGNOSIS — D649 Anemia, unspecified: Secondary | ICD-10-CM | POA: Insufficient documentation

## 2023-06-01 DIAGNOSIS — L98492 Non-pressure chronic ulcer of skin of other sites with fat layer exposed: Secondary | ICD-10-CM | POA: Diagnosis not present

## 2023-06-01 DIAGNOSIS — I872 Venous insufficiency (chronic) (peripheral): Secondary | ICD-10-CM | POA: Diagnosis not present

## 2023-06-01 DIAGNOSIS — I87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 hypertension (idiopathic) with ulcer and inflammation of left lower extremity: Secondary | ICD-10-CM | POA: Insufficient documentation

## 2023-06-01 DIAGNOSIS — C679 Malignant neoplasm of bladder, unspecified: Secondary | ICD-10-CM | POA: Insufficient documentation

## 2023-06-01 DIAGNOSIS — I70245 Atherosclerosis of native arteries of left leg with ulceration of other part of foot: Secondary | ICD-10-CM | POA: Insufficient documentation

## 2023-06-01 DIAGNOSIS — L97512 Non-pressure chronic ulcer of other part of right foot with fat layer exposed: Secondary | ICD-10-CM | POA: Diagnosis not present

## 2023-06-01 DIAGNOSIS — L89613 Pressure ulcer of right heel, stage 3: Secondary | ICD-10-CM | POA: Insufficient documentation

## 2023-06-01 DIAGNOSIS — J449 Chronic obstructive pulmonary disease, unspecified: Secondary | ICD-10-CM | POA: Insufficient documentation

## 2023-06-01 DIAGNOSIS — I69354 Hemiplegia and hemiparesis following cerebral infarction affecting left non-dominant side: Secondary | ICD-10-CM | POA: Diagnosis not present

## 2023-06-01 DIAGNOSIS — L97518 Non-pressure chronic ulcer of other part of right foot with other specified severity: Secondary | ICD-10-CM | POA: Diagnosis not present

## 2023-06-01 DIAGNOSIS — C187 Malignant neoplasm of sigmoid colon: Secondary | ICD-10-CM | POA: Diagnosis not present

## 2023-06-01 NOTE — Progress Notes (Signed)
[name], [name] ([id number]) [id number] Page 1 of 9 Visit Report for 06/01/2023 Arrival Information Details Patient Name: Date of Service: Chad [name], Chad [name] 06/01/2023 9:15 A M Medical Record Number: [id number] Patient Account Number: [id number] Date of Birth/Sex: Treating RN: 1961/06/23 (62 y.o. Chad [name] Primary Care Baylen Dea: [name] Other Clinician: Referring Zia Najera: Treating Angelli Baruch/Extender: [name] in Treatment: 16 Visit Information History Since Last Visit Added or deleted any medications: No Patient Arrived: Wheel Chair Any new allergies or adverse reactions: No Arrival Time: 09:20 Has Dressing in Place as Prescribed: Yes Accompanied By: self Pain Present Now: No Transfer Assistance: None Patient Identification Verified: Yes Secondary Verification Process Completed: Yes Patient Requires Transmission-Based No Precautions: Patient Has Alerts: Yes Patient Alerts: Patient on Blood Thinner NOT diabetic aspirin 81mg  Lives [place] [facility] ABI R 0.81 11/05/22 ABI L 0.59 11/05/22 [facility] 03/03/23 Electronic Signature(s) Signed: 06/01/2023 9:42:45 AM By: Midge Aver MSN RN CNS WTA Entered By: Midge Aver on 06/01/2023 06:42:45 -------------------------------------------------------------------------------- Clinic Level of Care Assessment Details Patient Name: Date of Service: Chad [name], Chad [name] 06/01/2023 9:15 A M Medical Record Number: [id number] Patient Account Number: [id number] Date of Birth/Sex: Treating RN: 1960-09-23 (62 y.o. Chad [name] Primary Care Zenaida Tesar: [name] Other Clinician: Referring Dorice Stiggers: Treating Hillery Zachman/Extender: [name] Weeks in Treatment: 77 Clinic Level of Care Assessment Items TOOL 4 Quantity Score X- 1 0 Use when only an EandM is performed on FOLLOW-UP visit ASSESSMENTS - Nursing Assessment / Reassessment PIO, EATHERLY ([id number])  [id number] Page 2 of 9 X- 1 10 Reassessment of Co-morbidities (includes updates in patient status) X- 1 5 Reassessment of Adherence to Treatment Plan ASSESSMENTS - Wound and Skin A ssessment / Reassessment []  - 0 Simple Wound Assessment / Reassessment - one wound X- 5 5 Complex Wound Assessment / Reassessment - multiple wounds []  - 0 Dermatologic / Skin Assessment (not related to wound area) ASSESSMENTS - Focused Assessment []  - 0 Circumferential Edema Measurements - multi extremities []  - 0 Nutritional Assessment / Counseling / Intervention []  - 0 Lower Extremity Assessment (monofilament, tuning fork, pulses) []  - 0 Peripheral Arterial Disease Assessment (using hand held doppler) ASSESSMENTS - Ostomy and/or Continence Assessment and Care []  - 0 Incontinence Assessment and Management []  - 0 Ostomy Care Assessment and Management (repouching, etc.) PROCESS - Coordination of Care []  - 0 Simple Patient / Family Education for ongoing care X- 1 20 Complex (extensive) Patient / Family Education for ongoing care X- 1 10 Staff obtains Chiropractor, Records, T Results / Process Orders est []  - 0 Staff telephones HHA, Nursing Homes / Clarify orders / etc []  - 0 Routine Transfer to another Facility (non-emergent condition) []  - 0 Routine Hospital Admission (non-emergent condition) []  - 0 New Admissions / Manufacturing engineer / Ordering NPWT Apligraf, etc. , []  - 0 Emergency Hospital Admission (emergent condition) []  - 0 Simple Discharge Coordination X- 1 15 Complex (extensive) Discharge Coordination PROCESS - Special Needs []  - 0 Pediatric / Minor Patient Management []  - 0 Isolation Patient Management []  - 0 Hearing / Language / Visual special needs []  - 0 Assessment of Community assistance (transportation, D/C planning, etc.) []  - 0 Additional assistance / Altered mentation []  - 0 Support Surface(s) Assessment (bed, cushion, seat,  etc.) INTERVENTIONS - Wound Cleansing / Measurement []  - 0 Simple Wound Cleansing - one wound X- 5 5 Complex Wound Cleansing - multiple wounds []  - 0 Wound Imaging (photographs - any number of wounds) []  - 0 Wound  Tracing (instead of photographs) []  - 0 Simple Wound Measurement - one wound []  - 0 Complex Wound Measurement - multiple wounds INTERVENTIONS - Wound Dressings []  - 0 Small Wound Dressing one or multiple wounds X- 5 15 Medium Wound Dressing one or multiple wounds []  - 0 Large Wound Dressing one or multiple wounds []  - 0 Application of Medications - topical []  - 0 Application of Medications - injection [name] ([id number]) [id number] Page 3 of 9 INTERVENTIONS - Miscellaneous []  - 0 External ear exam []  - 0 Specimen Collection (cultures, biopsies, blood, body fluids, etc.) []  - 0 Specimen(s) / Culture(s) sent or taken to Lab for analysis []  - 0 Patient Transfer (multiple staff / Michiel Sites Lift / Similar devices) []  - 0 Simple Staple / Suture removal (25 or less) []  - 0 Complex Staple / Suture removal (26 or more) []  - 0 Hypo / Hyperglycemic Management (close monitor of Blood Glucose) []  - 0 Ankle / Brachial Index (ABI) - do not check if billed separately []  - 0 Vital Signs Has the patient been seen at the hospital within the last three years: Yes Total Score: 185 Level Of Care: New/Established - Level 5 Electronic Signature(s) Signed: 06/02/2023 5:02:23 PM By: Midge Aver MSN RN CNS WTA Entered By: Midge Aver on 06/01/2023 06:46:43 -------------------------------------------------------------------------------- Encounter Discharge Information Details Patient Name: Date of Service: Chad [name] 06/01/2023 9:15 A M Medical Record Number: [id number] Patient Account Number: [id number] Date of Birth/Sex: Treating RN: Nov 07, 1960 (62 y.o. Chad [name] Primary Care Arieonna Medine: [name] Other Clinician: Referring  Kahron Kauth: Treating Harleyquinn Gasser/Extender: [name] in Treatment: 11 Encounter Discharge Information Items Discharge Condition: Stable Ambulatory Status: Wheelchair Discharge Destination: Skilled Nursing Facility Telephoned: No Orders Sent: Yes Transportation: Other Accompanied By: self Schedule Follow-up Appointment: Yes Clinical Summary of Care: Electronic Signature(s) Signed: 06/01/2023 9:45:34 AM By: Midge Aver MSN RN CNS WTA Entered By: Midge Aver on 06/01/2023 06:45:34 [name] ([id number]) [id number] Page 4 of 9 -------------------------------------------------------------------------------- Wound Assessment Details Patient Name: Date of Service: Chad [name], Chad [name] 06/01/2023 9:15 A M Medical Record Number: [id number] Patient Account Number: [id number] Date of Birth/Sex: Treating RN: 01/08/1961 (62 y.o. Chad [name] Primary Care Laquiesha Piacente: [name] Other Clinician: Referring Tija Biss: Treating Douglas Smolinsky/Extender: [name] Weeks in Treatment: 77 Wound Status Wound Number: 16 Primary Etiology: Pressure Ulcer Wound Location: Right Calcaneus Wound Status: Open Wounding Event: Gradually Appeared Date Acquired: 09/26/2022 Weeks Of Treatment: 33 Clustered Wound: Yes Wound Measurements Length: (cm) 10 Width: (cm) 6 Depth: (cm) 0.2 Area: (cm) 47.124 Volume: (cm) 9.425 % Reduction in Area: -4902.5% % Reduction in Volume: -4913.3% Wound Description Classification: Exudate Amount: Exudate Type: Exudate Color: Category/Stage III Medium Serosanguineous red, brown Treatment Notes Wound #16 (Calcaneus) Wound Laterality: Right Cleanser Soap and Water Discharge Instruction: Gently cleanse wound with antibacterial soap, rinse and pat dry prior to dressing wounds Wound Cleanser Discharge Instruction: Wash your hands with soap and water. Remove old dressing, discard into plastic bag and place into  trash. Cleanse the wound with Wound Cleanser prior to applying a clean dressing using gauze sponges, not tissues or cotton balls. Do not scrub or use excessive force. Pat dry using gauze sponges, not tissue or cotton balls. Peri-Wound Care Topical Primary Dressing Secondary Dressing Zetuvit Plus 4x8 (in/in) Heel Cup Secured With [employer] Sterile or Non-Sterile 6-ply 4.5x4 (yd/yd) Discharge Instruction: Apply Kerlix as directed Tubigrip Size D, 3x10 (in/yd) Compression Wrap Compression Stockings Add-Ons Electronic Signature(s) Signed: 06/02/2023 5:02:23 PM  By: Midge Aver MSN RN CNS [address] [name] ([id number]) By: Midge Aver MSN RN CNS Margarita Rana [phone].pdf Page 5 of 9 Signed: 06/02/2023 5:02:23 PM Entered By: Midge Aver on 06/01/2023 06:44:13 -------------------------------------------------------------------------------- Wound Assessment Details Patient Name: Date of Service: RACHAEL, ZAPANTA 06/01/2023 9:15 A M Medical Record Number: [id number] Patient Account Number: [id number] Date of Birth/Sex: Treating RN: 25-Apr-1961 (62 y.o. Chad [name] Primary Care Edvin Albus: [name] Other Clinician: Referring Vernica Wachtel: Treating Christropher Gintz/Extender: [name] Weeks in Treatment: 77 Wound Status Wound Number: 18 Primary Etiology: Pressure Ulcer Wound Location: Right, Medial T Second oe Wound Status: Open Wounding Event: Pressure Injury Date Acquired: 09/26/2022 Weeks Of Treatment: 32 Clustered Wound: No Wound Measurements Length: (cm) 2.5 Width: (cm) 2 Depth: (cm) 0.1 Area: (cm) 3.927 Volume: (cm) 0.393 % Reduction in Area: -614% % Reduction in Volume: -614.5% Wound Description Classification: Exudate Amount: Exudate Type: Exudate Color: Category/Stage III Medium Serosanguineous red, brown Treatment Notes Wound #18 (Toe Second) Wound Laterality: Right, Medial Cleanser Soap and Water Discharge Instruction: Gently  cleanse wound with antibacterial soap, rinse and pat dry prior to dressing wounds Wound Cleanser Discharge Instruction: Wash your hands with soap and water. Remove old dressing, discard into plastic bag and place into trash. Cleanse the wound with Wound Cleanser prior to applying a clean dressing using gauze sponges, not tissues or cotton balls. Do not scrub or use excessive force. Pat dry using gauze sponges, not tissue or cotton balls. Peri-Wound Care Topical Primary Dressing Secondary Dressing Zetuvit Plus 4x8 (in/in) Heel Cup Secured With [employer] Sterile or Non-Sterile 6-ply 4.5x4 (yd/yd) Discharge Instruction: Apply Kerlix as directed Tubigrip Size D, 3x10 (in/yd) Compression Wrap Compression Stockings ARTIST, BLOOM ([id number]) [id number].pdf Page 6 of 9 Add-Ons Electronic Signature(s) Signed: 06/02/2023 5:02:23 PM By: Midge Aver MSN RN CNS WTA Entered By: Midge Aver on 06/01/2023 06:44:13 -------------------------------------------------------------------------------- Wound Assessment Details Patient Name: Date of Service: Chad [name], Chad [name] 06/01/2023 9:15 A M Medical Record Number: [id number] Patient Account Number: [id number] Date of Birth/Sex: Treating RN: 09-Jun-1961 (62 y.o. Chad [name] Primary Care Taquan Bralley: [name] Other Clinician: Referring Rogen Porte: Treating Andrea Colglazier/Extender: [name] Weeks in Treatment: 77 Wound Status Wound Number: 21 Primary Etiology: Venous Leg Ulcer Wound Location: Right, Dorsal Foot Wound Status: Open Wounding Event: Pressure Injury Date Acquired: 03/02/2023 Weeks Of Treatment: 9 Clustered Wound: Yes Wound Measurements Length: (cm) 3.5 Width: (cm) 4.5 Depth: (cm) 0.2 Area: (cm) 12.37 Volume: (cm) 2.474 % Reduction in Area: -600.1% % Reduction in Volume: -1297.7% Wound Description Classification: Full Thickness Without Exposed Support Exudate Amount: Medium Exudate Type:  Serous Exudate Color: amber Structures Treatment Notes Wound #21 (Foot) Wound Laterality: Dorsal, Right Cleanser Soap and Water Discharge Instruction: Gently cleanse wound with antibacterial soap, rinse and pat dry prior to dressing wounds Wound Cleanser Discharge Instruction: Wash your hands with soap and water. Remove old dressing, discard into plastic bag and place into trash. Cleanse the wound with Wound Cleanser prior to applying a clean dressing using gauze sponges, not tissues or cotton balls. Do not scrub or use excessive force. Pat dry using gauze sponges, not tissue or cotton balls. Peri-Wound Care Topical Primary Dressing Secondary Dressing Zetuvit Plus 4x8 (in/in) Heel Cup Secured With [employer] Sterile or Non-Sterile 6-ply 4.5x4 (yd/yd) Discharge Instruction: Apply Kerlix as directed AYUUB, PENLEY ([id number]) [id number] Page 7 of 9 Tubigrip Size D, 3x10 (in/yd) Compression Wrap Compression Stockings Add-Ons Electronic Signature(s) Signed: 06/02/2023 5:02:23 PM By: Midge Aver MSN RN CNS WTA Entered  By: Midge Aver on 06/01/2023 06:44:13 -------------------------------------------------------------------------------- Wound Assessment Details Patient Name: Date of Service: Chad [name], Chad [name] 06/01/2023 9:15 A M Medical Record Number: [id number] Patient Account Number: [id number] Date of Birth/Sex: Treating RN: April 16, 1961 (62 y.o. Chad [name] Primary Care Aladdin Kollmann: [name] Other Clinician: Referring Donnell Beauchamp: Treating Febe Champa/Extender: [name] Weeks in Treatment: 77 Wound Status Wound Number: 22 Primary Etiology: Venous Leg Ulcer Wound Location: Right, Anterior Lower Leg Wound Status: Open Wounding Event: Pressure Injury Date Acquired: 03/02/2023 Weeks Of Treatment: 9 Clustered Wound: Yes Wound Measurements Length: (cm) 10 Width: (cm) 9.5 Depth: (cm) 0.1 Area: (cm) 74.613 Volume: (cm) 7.461 %  Reduction in Area: -3066.9% % Reduction in Volume: -1484.1% Wound Description Classification: Full Thickness Without Exposed Support Exudate Amount: Medium Exudate Type: Serous Exudate Color: amber Structures Treatment Notes Wound #22 (Lower Leg) Wound Laterality: Right, Anterior Cleanser Soap and Water Discharge Instruction: Gently cleanse wound with antibacterial soap, rinse and pat dry prior to dressing wounds Wound Cleanser Discharge Instruction: Wash your hands with soap and water. Remove old dressing, discard into plastic bag and place into trash. Cleanse the wound with Wound Cleanser prior to applying a clean dressing using gauze sponges, not tissues or cotton balls. Do not scrub or use excessive force. Pat dry using gauze sponges, not tissue or cotton balls. Peri-Wound Care Topical Primary Dressing Secondary Dressing Zetuvit Plus 4x8 (in/in) Heel Cup ANASTACIO, BUA ([id number]) [id number] Page 8 of 9 Secured With [employer] or Non-Sterile 6-ply 4.5x4 (yd/yd) Discharge Instruction: Apply Kerlix as directed Tubigrip Size D, 3x10 (in/yd) Compression Wrap Compression Stockings Add-Ons Electronic Signature(s) Signed: 06/02/2023 5:02:23 PM By: Midge Aver MSN RN CNS WTA Entered By: Midge Aver on 06/01/2023 06:44:13 -------------------------------------------------------------------------------- Wound Assessment Details Patient Name: Date of Service: Chad [name] 06/01/2023 9:15 A M Medical Record Number: [id number] Patient Account Number: [id number] Date of Birth/Sex: Treating RN: 05/06/1961 (62 y.o. Chad [name] Primary Care Abhi Moccia: [name] Other Clinician: Referring Omolola Mittman: Treating Adysson Revelle/Extender: [name] Weeks in Treatment: 77 Wound Status Wound Number: 23 Primary Etiology: Pressure Ulcer Wound Location: Right, Posterior Lower Leg Wound Status: Open Wounding Event: Pressure Injury Date  Acquired: 05/02/2023 Weeks Of Treatment: 3 Clustered Wound: Yes Wound Measurements Length: (cm) 6 Width: (cm) 3 Depth: (cm) 0.1 Area: (cm) 14.137 Volume: (cm) 1.414 % Reduction in Area: 10% % Reduction in Volume: 10% Wound Description Classification: Category/Stage III Exudate Amount: Medium Exudate Type: Serosanguineous Exudate Color: red, brown Treatment Notes Wound #23 (Lower Leg) Wound Laterality: Right, Posterior Cleanser Soap and Water Discharge Instruction: Gently cleanse wound with antibacterial soap, rinse and pat dry prior to dressing wounds Wound Cleanser Discharge Instruction: Wash your hands with soap and water. Remove old dressing, discard into plastic bag and place into trash. Cleanse the wound with Wound Cleanser prior to applying a clean dressing using gauze sponges, not tissues or cotton balls. Do not scrub or use excessive force. Pat dry using gauze sponges, not tissue or cotton balls. Peri-Wound Care Topical SKIPPY, MARHEFKA ([id number]) [id number] Page 9 of 9 Primary Dressing Secondary Dressing Zetuvit Plus 4x8 (in/in) Heel Cup Secured With [employer] Sterile or Non-Sterile 6-ply 4.5x4 (yd/yd) Discharge Instruction: Apply Kerlix as directed Tubigrip Size D, 3x10 (in/yd) Compression Wrap Compression Stockings Add-Ons Electronic Signature(s) Signed: 06/02/2023 5:02:23 PM By: Midge Aver MSN RN CNS WTA Entered By: Midge Aver on 06/01/2023 06:44:13

## 2023-06-02 NOTE — Progress Notes (Signed)
Chad [name] ([id number]) [id number] Page 1 of 3 Visit Report for 06/01/2023 Physician Orders Details Patient Name: Date of Service: Chad [name], Chad [name] 06/01/2023 9:15 A M Medical Record Number: [id number] Patient Account Number: [id number] Date of Birth/Sex: Treating RN: [birth year] (62 y.o. Chad [name] Primary Care Provider: Cyril [name] Other Clinician: Referring Provider: Treating Provider/Extender: Chad [name] in Treatment: 26 Verbal / Phone Orders: No Diagnosis Coding Follow-up Appointments Return Appointment in 1 week. Nurse Visit as needed Bathing/ Shower/ Hygiene Clean wound with Normal Saline or wound cleanser. No tub bath. Anesthetic (Use 'Patient Medications' Section for Anesthetic Order Entry) Lidocaine applied to wound bed Off-Loading Heel suspension boot - when in bed Turn and reposition every 2 hours Medications-Please add to medication list. ntibiotics - RX renewed 05/25/2023 P.O. 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 Remove old dressing, discard into plastic bag and place into trash. Cleanse the wound with Wound Cleanser prior to applying a clean dressing using gauze sponges, not tissues or cotton balls. Do not scrub or use excessive force. Pat dry using gauze sponges, not tissue or cotton balls. Secondary Dressing: Zetuvit Plus 4x8 (in/in) 1 x Per Day/30 Days Secondary Dressing: Heel Cup 1 x Per Day/30 Days Secured With: Kerlix Roll Sterile or Non-Sterile 6-ply 4.5x4 (yd/yd) 1 x Per Day/30 Days Discharge Instructions: Apply Kerlix as directed Secured With: Tubigrip Size D, 3x10 (in/yd) 1 x Per Day/30 Days Wound #18 - T  Second oe Wound Laterality: Right, Medial Cleanser: Soap and Water 1 x Per Day/30 Days Discharge Instructions: Gently cleanse wound with antibacterial soap, rinse and pat dry prior to dressing wounds Cleanser: Wound Cleanser 1 x Per Day/30 Days Discharge Instructions: Wash your hands with soap and water. Remove old dressing, discard into plastic bag and place into trash. Cleanse the wound with Wound Cleanser prior to applying a clean dressing using gauze sponges, not tissues or cotton balls. Do not scrub or use excessive force. Pat dry using gauze sponges, not tissue or cotton balls. Secondary Dressing: Zetuvit Plus 4x8 (in/in) 1 x Per Day/30 Days Secondary Dressing: Heel Cup 1 x Per Day/30 Days Chad [name], Chad [name] ([id number]) [id number].pdf Page 2 of 3 Secured With: [employer] or Non-Sterile 6-ply 4.5x4 (yd/yd) 1 x Per Day/30 Days Discharge Instructions: Apply Kerlix as directed Secured With: Tubigrip Size D, 3x10 (in/yd) 1 x Per Day/30 Days Wound #21 - Foot Wound Laterality: Dorsal, Right Cleanser: Soap and Water 1 x Per Day/30 Days Discharge Instructions: Gently cleanse wound with antibacterial soap, rinse and pat dry prior to dressing wounds Cleanser: Wound Cleanser 1 x Per Day/30 Days Discharge Instructions: Wash your hands with soap and water. Remove old dressing, discard into plastic bag and place into trash. Cleanse the wound with Wound Cleanser prior to applying a clean dressing using gauze sponges, not tissues or cotton balls. Do not scrub or use excessive force. Pat dry using gauze sponges, not tissue or cotton balls. Secondary Dressing: Zetuvit Plus 4x8 (in/in) 1 x Per Day/30 Days Secondary Dressing: Heel Cup 1 x Per Day/30 Days Secured With: Kerlix Roll Sterile or Non-Sterile 6-ply 4.5x4 (yd/yd) 1 x Per Day/30 Days Discharge Instructions: Apply Kerlix as directed Secured With: Tubigrip Size D, 3x10 (in/yd) 1 x Per  Day/30 Days Wound #22 - Lower Leg  Wound Laterality: Right, Anterior Cleanser: Soap and Water 1 x Per Day/30 Days Discharge Instructions: Gently cleanse wound with antibacterial soap, rinse and pat dry prior to dressing wounds Cleanser: Wound Cleanser 1 x Per Day/30 Days Discharge Instructions: Wash your hands with soap and water. Remove old dressing, discard into plastic bag and place into trash. Cleanse the wound with Wound Cleanser prior to applying a clean dressing using gauze sponges, not tissues or cotton balls. Do not scrub or use excessive force. Pat dry using gauze sponges, not tissue or cotton balls. Secondary Dressing: Zetuvit Plus 4x8 (in/in) 1 x Per Day/30 Days Secondary Dressing: Heel Cup 1 x Per Day/30 Days Secured With: Kerlix Roll Sterile or Non-Sterile 6-ply 4.5x4 (yd/yd) 1 x Per Day/30 Days Discharge Instructions: Apply Kerlix as directed Secured With: Tubigrip Size D, 3x10 (in/yd) 1 x Per Day/30 Days Wound #23 - Lower Leg Wound Laterality: Right, Posterior Cleanser: Soap and Water 1 x Per Day/30 Days Discharge Instructions: Gently cleanse wound with antibacterial soap, rinse and pat dry prior to dressing wounds Cleanser: Wound Cleanser 1 x Per Day/30 Days Discharge Instructions: Wash your hands with soap and water. Remove old dressing, discard into plastic bag and place into trash. Cleanse the wound with Wound Cleanser prior to applying a clean dressing using gauze sponges, not tissues or cotton balls. Do not scrub or use excessive force. Pat dry using gauze sponges, not tissue or cotton balls. Secondary Dressing: Zetuvit Plus 4x8 (in/in) 1 x Per Day/30 Days Secondary Dressing: Heel Cup 1 x Per Day/30 Days Secured With: Kerlix Roll Sterile or Non-Sterile 6-ply 4.5x4 (yd/yd) 1 x Per Day/30 Days Discharge Instructions: Apply Kerlix as directed Secured With: Tubigrip Size D, 3x10 (in/yd) 1 x Per Day/30 Days Electronic Signature(s) Signed: 06/01/2023 9:44:53 AM By: Chad Aver MSN RN CNS WTA Signed: 06/02/2023  3:24:56 PM By: Chad Derry PA-C Entered By: Chad [name] on 06/01/2023 06:44:52 Chad [name] ([id number]) [id number] Page 3 of 3 -------------------------------------------------------------------------------- SuperBill Details Patient Name: Date of Service: Chad [name], Chad [name] 06/01/2023 Medical Record Number: [id number] Patient Account Number: [id number] Date of Birth/Sex: Treating RN: May 07, 1961 (62 y.o. 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

## 2023-06-09 ENCOUNTER — Encounter: Payer: Medicaid Other | Admitting: Physician Assistant

## 2023-06-09 DIAGNOSIS — I87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 hypertension (idiopathic) with ulcer and inflammation of left lower extremity: Secondary | ICD-10-CM | POA: Diagnosis not present

## 2023-06-09 NOTE — Progress Notes (Addendum)
NINA, MASHAK ([id number]) [id number] Page 1 of 14 Visit Report for 06/09/2023 Arrival Information Details Patient Name: Date of Service: Chad [name], Chad [name] 06/09/2023 10:15 A M Medical Record Number: [id number] Patient Account Number: [id number] Date of Birth/Sex: Treating RN: Nov 23, 1960 (62 y.o. [name] Primary Care Michaiah Maiden: [name] Other Clinician: Referring Jeraline Marcinek: Treating Tynesha Free/Extender: [name] in Treatment: 18 Visit Information History Since Last Visit Added or deleted any medications: No Patient Arrived: Wheel Chair Any new allergies or adverse reactions: No Arrival Time: 10:13 Has Dressing in Place as Prescribed: Yes Accompanied By: self Pain Present Now: No Transfer Assistance: EasyPivot Patient Lift Patient Identification Verified: Yes Secondary Verification Process Completed: Yes Patient Requires Transmission-Based No Precautions: Patient Has Alerts: Yes Patient Alerts: Patient on Blood Thinner NOT diabetic aspirin 81mg  Lives [facility] ABI R 0.81 11/05/22 ABI L 0.59 11/05/22 [facility] 03/03/23 Electronic Signature(s) Signed: 06/09/2023 4:52:48 PM By: Midge Aver MSN RN CNS WTA Entered By: Midge Aver on 06/09/2023 07:19:59 -------------------------------------------------------------------------------- Clinic Level of Care Assessment Details Patient Name: Date of Service: Chad [name], Chad [name] 06/09/2023 10:15 A M Medical Record Number: [id number] Patient Account Number: [id number] Date of Birth/Sex: Treating RN: 11-15-1960 (62 y.o. [name] Primary Care Jaiana Sheffer: [name] Other Clinician: Referring Creedence Kunesh: Treating Thy Gullikson/Extender: [name] Weeks in Treatment: 78 Clinic Level of Care Assessment Items TOOL 4 Quantity Score X- 1 0 Use when only an EandM is performed on FOLLOW-UP visit ASSESSMENTS - Nursing Assessment / Reassessment GROVE, SAVKO  ([id number]) [id number] Page 2 of 14 X- 1 10 Reassessment of Co-morbidities (includes updates in patient status) X- 1 5 Reassessment of Adherence to Treatment Plan ASSESSMENTS - Wound and Skin A ssessment / Reassessment []  - 0 Simple Wound Assessment / Reassessment - one wound X- 5 5 Complex Wound Assessment / Reassessment - multiple wounds []  - 0 Dermatologic / Skin Assessment (not related to wound area) ASSESSMENTS - Focused Assessment []  - 0 Circumferential Edema Measurements - multi extremities []  - 0 Nutritional Assessment / Counseling / Intervention []  - 0 Lower Extremity Assessment (monofilament, tuning fork, pulses) []  - 0 Peripheral Arterial Disease Assessment (using hand held doppler) ASSESSMENTS - Ostomy and/or Continence Assessment and Care []  - 0 Incontinence Assessment and Management []  - 0 Ostomy Care Assessment and Management (repouching, etc.) PROCESS - Coordination of Care []  - 0 Simple Patient / Family Education for ongoing care X- 1 20 Complex (extensive) Patient / Family Education for ongoing care X- 1 10 Staff obtains Chiropractor, Records, T Results / Process Orders est []  - 0 Staff telephones HHA, Nursing Homes / Clarify orders / etc []  - 0 Routine Transfer to another Facility (non-emergent condition) []  - 0 Routine Hospital Admission (non-emergent condition) []  - 0 New Admissions / Manufacturing engineer / Ordering NPWT Apligraf, etc. , []  - 0 Emergency Hospital Admission (emergent condition) []  - 0 Simple Discharge Coordination X- 1 15 Complex (extensive) Discharge Coordination PROCESS - Special Needs []  - 0 Pediatric / Minor Patient Management []  - 0 Isolation Patient Management []  - 0 Hearing / Language / Visual special needs []  - 0 Assessment of Community assistance (transportation, D/C planning, etc.) []  - 0 Additional assistance / Altered mentation []  - 0 Support Surface(s) Assessment (bed, cushion,  seat, etc.) INTERVENTIONS - Wound Cleansing / Measurement []  - 0 Simple Wound Cleansing - one wound X- 5 5 Complex Wound Cleansing - multiple wounds X- 1 5 Wound Imaging (photographs - any number of wounds) []  -  0 Wound Tracing (instead of photographs) []  - 0 Simple Wound Measurement - one wound X- 5 5 Complex Wound Measurement - multiple wounds INTERVENTIONS - Wound Dressings []  - 0 Small Wound Dressing one or multiple wounds X- 5 15 Medium Wound Dressing one or multiple wounds []  - 0 Large Wound Dressing one or multiple wounds []  - 0 Application of Medications - topical []  - 0 Application of Medications - injection [name] ([id number]) [id number] Page 3 of 14 INTERVENTIONS - Miscellaneous []  - 0 External ear exam []  - 0 Specimen Collection (cultures, biopsies, blood, body fluids, etc.) []  - 0 Specimen(s) / Culture(s) sent or taken to Lab for analysis []  - 0 Patient Transfer (multiple staff / Michiel Sites Lift / Similar devices) []  - 0 Simple Staple / Suture removal (25 or less) []  - 0 Complex Staple / Suture removal (26 or more) []  - 0 Hypo / Hyperglycemic Management (close monitor of Blood Glucose) []  - 0 Ankle / Brachial Index (ABI) - do not check if billed separately X- 1 5 Vital Signs Has the patient been seen at the hospital within the last three years: Yes Total Score: 220 Level Of Care: New/Established - Level 5 Electronic Signature(s) Signed: 06/09/2023 4:52:48 PM By: Midge Aver MSN RN CNS WTA Entered By: Midge Aver on 06/09/2023 08:22:27 -------------------------------------------------------------------------------- Encounter Discharge Information Details Patient Name: Date of Service: Chad [name] 06/09/2023 10:15 A M Medical Record Number: [id number] Patient Account Number: [id number] Date of Birth/Sex: Treating RN: [birth year] (62 y.o. [name] Primary Care Square Jowett: [name] Other  Clinician: Referring Ryelee Albee: Treating Luellen Howson/Extender: [name] in Treatment: 73 Encounter Discharge Information Items Discharge Condition: Stable Ambulatory Status: Wheelchair Discharge Destination: Skilled Nursing Facility Telephoned: No Orders Sent: Yes Transportation: Other Accompanied By: self Schedule Follow-up Appointment: Yes Clinical Summary of Care: Electronic Signature(s) Signed: 06/09/2023 4:52:48 PM By: Midge Aver MSN RN CNS WTA Entered By: Midge Aver on 06/09/2023 08:23:50 [name] ([id number]) [id number] Page 4 of 14 -------------------------------------------------------------------------------- Lower Extremity Assessment Details Patient Name: Date of Service: Chad [name], Chad [name] 06/09/2023 10:15 A M Medical Record Number: [id number] Patient Account Number: [id number] Date of Birth/Sex: Treating RN: [birth year] (63 y.o. [name] Primary Care Nazir Hacker: [name] Other Clinician: Referring Shalandria Elsbernd: Treating Lawarence Meek/Extender: [name] Weeks in Treatment: 78 Edema Assessment Assessed: [Left: No] [Right: No] [Left: Edema] [Right: :] Calf Left: Right: Point of Measurement: 33 cm From Medial Instep 41 cm Ankle Left: Right: Point of Measurement: 12 cm From Medial Instep 24.5 cm Vascular Assessment Pulses: Dorsalis Pedis Palpable: [Right:Yes] Extremity colors, hair growth, and conditions: Extremity Color: [Right:Red] Hair Growth on Extremity: [Right:No] Temperature of Extremity: [Right:Warm] Capillary Refill: [Right:< 3 seconds] Dependent Rubor: [Right:No] Blanched when Elevated: [Right:No No] Electronic Signature(s) Signed: 06/09/2023 4:52:48 PM By: Midge Aver MSN RN CNS WTA Entered By: Midge Aver on 06/09/2023 07:48:45 -------------------------------------------------------------------------------- Multi Wound Chart Details Patient Name: Date of  Service: Chad [name] 06/09/2023 10:15 A M Medical Record Number: [id number] Patient Account Number: [id number] Date of Birth/Sex: Treating RN: 1960-09-07 (62 y.o. [name] Primary Care Daryus Sowash: [name] Other Clinician: Referring Wissam Resor: Treating Rafe Mackowski/Extender: [name] Weeks in Treatment: [address], [place] ([id number]) [id number] Page 5 of 14 Vital Signs Height(in): 69 Pulse(bpm): 93 Weight(lbs): 150 Blood Pressure(mmHg): 95/74 Body Mass Index(BMI): 22.1 Temperature(F): 97.7 Respiratory Rate(breaths/min): 18 [16:Photos:] Right Calcaneus Right, Medial T Second oe Right, Dorsal Foot Wound Location: Gradually Appeared Pressure Injury Pressure Injury Wounding Event: Pressure Ulcer  Pressure Ulcer Venous Leg Ulcer Primary Etiology: Anemia, Chronic Obstructive Anemia, Chronic Obstructive Anemia, Chronic Obstructive Comorbid History: Pulmonary Disease (COPD), Coronary Pulmonary Disease (COPD), Coronary Pulmonary Disease (COPD), Coronary Artery Disease, Peripheral Arterial Artery Disease, Peripheral Arterial Artery Disease, Peripheral Arterial Disease, Peripheral Venous Disease, Disease, Peripheral Venous Disease, Disease, Peripheral Venous Disease, Hepatitis C, Osteoarthritis, Hepatitis C, Osteoarthritis, Hepatitis C, Osteoarthritis, Neuropathy, Received Chemotherapy Neuropathy, Received Chemotherapy Neuropathy, Received Chemotherapy 09/26/2022 09/26/2022 03/02/2023 Date Acquired: 35 34 10 Weeks of Treatment: Open Open Open Wound Status: No No No Wound Recurrence: Yes No Yes Clustered Wound: 9.5x5x0.2 1.5x1x0.1 5x4.8x0.2 Measurements L x W x D (cm) 37.306 1.178 18.85 A (cm) : rea 7.461 0.118 3.77 Volume (cm) : -3860.30% -114.20% -966.80% % Reduction in Area: -3868.60% -114.50% -2029.90% % Reduction in Volume: Category/Stage III Category/Stage III Full Thickness Without Exposed Classification: Support  Structures Medium Medium Medium Exudate Amount: Serosanguineous Serosanguineous Serous Exudate Type: red, brown red, brown amber Exudate Color: Wound Number: 22 23 N/A Photos: N/A Right, Anterior Lower Leg Right, Posterior Lower Leg N/A Wound Location: Pressure Injury Pressure Injury N/A Wounding Event: Venous Leg Ulcer Pressure Ulcer N/A Primary Etiology: Anemia, Chronic Obstructive Anemia, Chronic Obstructive N/A Comorbid History: Pulmonary Disease (COPD), Coronary Pulmonary Disease (COPD), Coronary Artery Disease, Peripheral Arterial Artery Disease, Peripheral Arterial Disease, Peripheral Venous Disease, Disease, Peripheral Venous Disease, Hepatitis C, Osteoarthritis, Hepatitis C, Osteoarthritis, Neuropathy, Received Chemotherapy Neuropathy, Received Chemotherapy 03/02/2023 05/02/2023 N/A Date Acquired: 10 4 N/A Weeks of Treatment: Open Open N/A Wound Status: No No N/A Wound Recurrence: Yes Yes N/A Clustered Wound: 11x14.5x0.1 7x6.5x0.1 N/A Measurements L x W x D (cm) 125.271 35.736 N/A A (cm) : rea 12.527 3.574 N/A Volume (cm) : -5217.10% -127.50% N/A % Reduction in Area: -2559.70% -127.50% N/A % Reduction in Volume: Full Thickness Without Exposed Category/Stage III N/A Classification: Support Structures Medium Medium N/A Exudate Amount: Serous Serosanguineous N/A Exudate Type: amber red, brown N/A Exudate Color: Treatment Notes YOUSOF, MIDGLEY ([id number]) [id number] Page 6 of 14 Electronic Signature(s) Signed: 06/09/2023 4:52:48 PM By: Midge Aver MSN RN CNS WTA Entered By: Midge Aver on 06/09/2023 08:20:30 -------------------------------------------------------------------------------- Multi-Disciplinary Care Plan Details Patient Name: Date of Service: CALVEN, SANKER [name] 06/09/2023 10:15 A M Medical Record Number: [id number] Patient Account Number: [id number] Date of Birth/Sex: Treating RN: 01/07/1961 (62 y.o. [name] Primary Care Kensey Luepke: [name] Other Clinician: Referring Jden Want: Treating Garion Wempe/Extender: [name] Weeks in Treatment: 45 Active Inactive Venous Leg Ulcer Nursing Diagnoses: Knowledge deficit related to disease process and management Goals: Patient will maintain optimal edema control Date Initiated: 12/06/2021 Target Resolution Date: 07/26/2023 Goal Status: Active Patient/caregiver will verbalize understanding of disease process and disease management Date Initiated: 12/06/2021 Date Inactivated: 12/19/2022 Target Resolution Date: 11/27/2022 Goal Status: Met Interventions: Assess peripheral edema status every visit. Compression as ordered Notes: Electronic Signature(s) Signed: 06/09/2023 4:52:48 PM By: Midge Aver MSN RN CNS WTA Entered By: Midge Aver on 06/09/2023 08:22:49 -------------------------------------------------------------------------------- Pain Assessment Details Patient Name: Date of Service: Chad [name], Chad [name] 06/09/2023 10:15 A M Medical Record Number: [id number] Patient Account Number: [id number] Date of Birth/Sex: Treating RN: Feb 19, 1961 (62 y.o. [name] Primary Care Eulamae Greenstein: [name] Other Clinician: [name] ([id number]) [id number] Page 7 of 14 Referring Bijal Siglin: Treating Ismael Treptow/Extender: [name] in Treatment: 78 Active Problems Location of Pain Severity and Description of Pain Patient Has Paino Yes Site Locations Rate the pain. Current Pain Level: 5 Pain Management and Medication Current Pain Management: Electronic Signature(s) Signed: 06/09/2023 4:52:48  PM By: Midge Aver MSN RN CNS WTA Entered By: Midge Aver on 06/09/2023 07:35:56 -------------------------------------------------------------------------------- Patient/Caregiver Education Details Patient Name: Date of Service: [facility] [place] [name] 11/12/2024andnbsp10:15 A M Medical Record  Number: [id number] Patient Account Number: [id number] Date of Birth/Gender: Treating RN: [birth year] (62 y.o. [name] Primary Care Physician: [name] Other Clinician: Referring Physician: Treating Physician/Extender: [name] in Treatment: 11 Education Assessment Education Provided To: Patient Education Topics Provided Wound/Skin Impairment: Handouts: Caring for Your Ulcer Methods: Explain/Verbal Responses: State content correctly Electronic Signature(s) Signed: 06/09/2023 4:52:48 PM By: Midge Aver MSN RN CNS WTA Entered By: Midge Aver on 06/09/2023 08:23:00 [name] ([id number]) [id number] Page 8 of 14 -------------------------------------------------------------------------------- Wound Assessment Details Patient Name: Date of Service: Chad [name], Chad [name] 06/09/2023 10:15 A M Medical Record Number: [id number] Patient Account Number: [id number] Date of Birth/Sex: Treating RN: 01-04-1961 (62 y.o. [name] Primary Care Jasalyn Frysinger: [name] Other Clinician: Referring Rubylee Zamarripa: Treating Brytni Dray/Extender: [name] Weeks in Treatment: 78 Wound Status Wound Number: 16 Primary Pressure Ulcer Etiology: Wound Location: Right Calcaneus Wound Open Wounding Event: Gradually Appeared Status: Date Acquired: 09/26/2022 Comorbid Anemia, Chronic Obstructive Pulmonary Disease (COPD), Coronary Weeks Of Treatment: 35 History: Artery Disease, Peripheral Arterial Disease, Peripheral Venous Clustered Wound: Yes Disease, Hepatitis C, Osteoarthritis, Neuropathy, Received Chemotherapy Photos Wound Measurements Length: (cm) 9.5 Width: (cm) 5 Depth: (cm) 0.2 Area: (cm) 37.306 Volume: (cm) 7.461 % Reduction in Area: -3860.3% % Reduction in Volume: -3868.6% Wound Description Classification: Category/Stage III Exudate Amount: Medium Exudate Type: Serosanguineous Exudate Color: red, brown Treatment  Notes Wound #16 (Calcaneus) Wound Laterality: Right Cleanser Soap and Water Discharge Instruction: Gently cleanse wound with antibacterial soap, rinse and pat dry prior to dressing wounds Wound Cleanser Discharge Instruction: Wash your hands with soap and water. Remove old dressing, discard into plastic bag and place into trash. Cleanse the wound with Wound Cleanser prior to applying a clean dressing using gauze sponges, not tissues or cotton balls. Do not scrub or use excessive force. Pat dry using gauze sponges, not tissue or cotton balls. Peri-Wound Care Topical JAKOBEE, SNYDER ([id number]) [id number] Page 9 of 14 Primary Dressing Secondary Dressing Zetuvit Plus 4x8 (in/in) Heel Cup Secured With [employer] Sterile or Non-Sterile 6-ply 4.5x4 (yd/yd) Discharge Instruction: Apply Kerlix as directed Tubigrip Size D, 3x10 (in/yd) Compression Wrap Compression Stockings Add-Ons Electronic Signature(s) Signed: 06/09/2023 4:52:48 PM By: Midge Aver MSN RN CNS WTA Entered By: Midge Aver on 06/09/2023 07:43:55 -------------------------------------------------------------------------------- Wound Assessment Details Patient Name: Date of Service: Chad [name] 06/09/2023 10:15 A M Medical Record Number: [id number] Patient Account Number: [id number] Date of Birth/Sex: Treating RN: [birth year] (62 y.o. [name] Primary Care Simonne Boulos: [name] Other Clinician: Referring Khamani Daniely: Treating Leela Vanbrocklin/Extender: [name] Weeks in Treatment: 78 Wound Status Wound Number: 18 Primary Pressure Ulcer Etiology: Wound Location: Right, Medial T Second oe Wound Open Wounding Event: Pressure Injury Status: Date Acquired: 09/26/2022 Comorbid Anemia, Chronic Obstructive Pulmonary Disease (COPD), Coronary Weeks Of Treatment: 34 History: Artery Disease, Peripheral Arterial Disease, Peripheral Venous Clustered Wound: No Disease, Hepatitis C,  Osteoarthritis, Neuropathy, Received Chemotherapy Photos Wound Measurements Length: (cm) 1.5 Width: (cm) 1 Depth: (cm) 0.1 Area: (cm) 1.178 Volume: (cm) 0.118 % Reduction in Area: -114.2% % Reduction in Volume: -114.5% Wound Description Classification: Category/Stage III Dolson, Breylen ([id number]) Exudate Amount: Medium Exudate Type: Serosanguineous Exudate Color: red, brown [id number] Page 10 of 14 Treatment Notes Wound #18 (Toe Second) Wound Laterality: Right, Medial Cleanser Soap and Water  Discharge Instruction: Gently cleanse wound with antibacterial soap, rinse and pat dry prior to dressing wounds Wound Cleanser Discharge Instruction: Wash your hands with soap and water. Remove old dressing, discard into plastic bag and place into trash. Cleanse the wound with Wound Cleanser prior to applying a clean dressing using gauze sponges, not tissues or cotton balls. Do not scrub or use excessive force. Pat dry using gauze sponges, not tissue or cotton balls. Peri-Wound Care Topical Primary Dressing Secondary Dressing Zetuvit Plus 4x8 (in/in) Heel Cup Secured With [employer] Sterile or Non-Sterile 6-ply 4.5x4 (yd/yd) Discharge Instruction: Apply Kerlix as directed Tubigrip Size D, 3x10 (in/yd) Compression Wrap Compression Stockings Add-Ons Electronic Signature(s) Signed: 06/09/2023 4:52:48 PM By: Midge Aver MSN RN CNS WTA Entered By: Midge Aver on 06/09/2023 07:44:27 -------------------------------------------------------------------------------- Wound Assessment Details Patient Name: Date of Service: Chad [name] 06/09/2023 10:15 A M Medical Record Number: [id number] Patient Account Number: [id number] Date of Birth/Sex: Treating RN: Jun 17, 1961 (62 y.o. [name] Primary Care Rahmon Heigl: [name] Other Clinician: Referring Onie Hayashi: Treating Roselina Burgueno/Extender: [name] Weeks in Treatment: 78 Wound  Status Wound Number: 21 Primary Venous Leg Ulcer Etiology: Wound Location: Right, Dorsal Foot Wound Open Wounding Event: Pressure Injury Status: Date Acquired: 03/02/2023 Comorbid Anemia, Chronic Obstructive Pulmonary Disease (COPD), Coronary Weeks Of Treatment: 10 History: Artery Disease, Peripheral Arterial Disease, Peripheral Venous Clustered Wound: Yes Disease, Hepatitis C, Osteoarthritis, Neuropathy, Received Chemotherapy Photos EMERT, SORGI ([id number]) [id number] Page 11 of 14 Wound Measurements Length: (cm) 5 Width: (cm) 4.8 Depth: (cm) 0.2 Area: (cm) 18.85 Volume: (cm) 3.77 % Reduction in Area: -966.8% % Reduction in Volume: -2029.9% Wound Description Classification: Full Thickness Without Exposed Support Exudate Amount: Medium Exudate Type: Serous Exudate Color: amber Structures Treatment Notes Wound #21 (Foot) Wound Laterality: Dorsal, Right Cleanser Soap and Water Discharge Instruction: Gently cleanse wound with antibacterial soap, rinse and pat dry prior to dressing wounds Wound Cleanser Discharge Instruction: Wash your hands with soap and water. Remove old dressing, discard into plastic bag and place into trash. Cleanse the wound with Wound Cleanser prior to applying a clean dressing using gauze sponges, not tissues or cotton balls. Do not scrub or use excessive force. Pat dry using gauze sponges, not tissue or cotton balls. Peri-Wound Care Topical Primary Dressing Secondary Dressing Zetuvit Plus 4x8 (in/in) Heel Cup Secured With [employer] Sterile or Non-Sterile 6-ply 4.5x4 (yd/yd) Discharge Instruction: Apply Kerlix as directed Tubigrip Size D, 3x10 (in/yd) Compression Wrap Compression Stockings Add-Ons Electronic Signature(s) Signed: 06/09/2023 4:52:48 PM By: Midge Aver MSN RN CNS WTA Entered By: Midge Aver on 06/09/2023 07:44:49 [name] ([id number]) [id number] Page 12 of  14 -------------------------------------------------------------------------------- Wound Assessment Details Patient Name: Date of Service: Chad [name], Chad [name] 06/09/2023 10:15 A M Medical Record Number: [id number] Patient Account Number: [id number] Date of Birth/Sex: Treating RN: 12/27/1960 (62 y.o. [name] Primary Care Buffi Ewton: [name] Other Clinician: Referring Seanna Sisler: Treating Dewell Monnier/Extender: [name] Weeks in Treatment: 78 Wound Status Wound Number: 22 Primary Venous Leg Ulcer Etiology: Wound Location: Right, Anterior Lower Leg Wound Open Wounding Event: Pressure Injury Status: Date Acquired: 03/02/2023 Comorbid Anemia, Chronic Obstructive Pulmonary Disease (COPD), Coronary Weeks Of Treatment: 10 History: Artery Disease, Peripheral Arterial Disease, Peripheral Venous Clustered Wound: Yes Disease, Hepatitis C, Osteoarthritis, Neuropathy, Received Chemotherapy Photos Wound Measurements Length: (cm) 11 Width: (cm) 14.5 Depth: (cm) 0.1 Area: (cm) 125.271 Volume: (cm) 12.527 % Reduction in Area: -5217.1% % Reduction in Volume: -2559.7% Wound Description Classification: Full Thickness Without Exposed Support  Exudate Amount: Medium Exudate Type: Serous Exudate Color: amber Structures Treatment Notes Wound #22 (Lower Leg) Wound Laterality: Right, Anterior Cleanser Soap and Water Discharge Instruction: Gently cleanse wound with antibacterial soap, rinse and pat dry prior to dressing wounds Wound Cleanser Discharge Instruction: Wash your hands with soap and water. Remove old dressing, discard into plastic bag and place into trash. Cleanse the wound with Wound Cleanser prior to applying a clean dressing using gauze sponges, not tissues or cotton balls. Do not scrub or use excessive force. Pat dry using gauze sponges, not tissue or cotton balls. Peri-Wound Care Topical Primary Dressing Secondary Dressing Zetuvit Plus 4x8 (in/in) Heel  Cup Secured With [employer] Sterile or Non-Sterile 6-ply 4.5x4 (yd/yd) Discharge Instruction: Apply Kerlix as directed Tubigrip Size D, 3x10 (in/yd) Compression Wrap Compression Stockings TAYVEON, OHR ([id number]) [id number] Page 13 of 14 Add-Ons Electronic Signature(s) Signed: 06/09/2023 4:52:48 PM By: Midge Aver MSN RN CNS WTA Entered By: Midge Aver on 06/09/2023 07:45:12 -------------------------------------------------------------------------------- Wound Assessment Details Patient Name: Date of Service: Chad [name], Chad [name] 06/09/2023 10:15 A M Medical Record Number: [id number] Patient Account Number: [id number] Date of Birth/Sex: Treating RN: [birth year] (62 y.o. [name] Primary Care Tamirra Sienkiewicz: [name] Other Clinician: Referring Shabazz Mckey: Treating Adaleigh Warf/Extender: [name] Weeks in Treatment: 78 Wound Status Wound Number: 23 Primary Pressure Ulcer Etiology: Wound Location: Right, Posterior Lower Leg Wound Open Wounding Event: Pressure Injury Status: Date Acquired: 05/02/2023 Comorbid Anemia, Chronic Obstructive Pulmonary Disease (COPD), Coronary Weeks Of Treatment: 4 History: Artery Disease, Peripheral Arterial Disease, Peripheral Venous Clustered Wound: Yes Disease, Hepatitis C, Osteoarthritis, Neuropathy, Received Chemotherapy Photos Wound Measurements Length: (cm) 7 Width: (cm) 6.5 Depth: (cm) 0.1 Area: (cm) 35.736 Volume: (cm) 3.574 % Reduction in Area: -127.5% % Reduction in Volume: -127.5% Wound Description Classification: Category/Stage III Exudate Amount: Medium Exudate Type: Serosanguineous Exudate Color: red, brown Treatment Notes Wound #23 (Lower Leg) Wound Laterality: Right, Posterior Cleanser Soap and Water Discharge Instruction: Gently cleanse wound with antibacterial soap, rinse and pat dry prior to dressing wounds Wound Cleanser [name] ([id number])  [id number] Page 14 of 14 Discharge Instruction: Wash your hands with soap and water. Remove old dressing, discard into plastic bag and place into trash. Cleanse the wound with Wound Cleanser prior to applying a clean dressing using gauze sponges, not tissues or cotton balls. Do not scrub or use excessive force. Pat dry using gauze sponges, not tissue or cotton balls. Peri-Wound Care Topical Primary Dressing Secondary Dressing Zetuvit Plus 4x8 (in/in) Heel Cup Secured With [employer] Sterile or Non-Sterile 6-ply 4.5x4 (yd/yd) Discharge Instruction: Apply Kerlix as directed Tubigrip Size D, 3x10 (in/yd) Compression Wrap Compression Stockings Add-Ons Electronic Signature(s) Signed: 06/09/2023 4:52:48 PM By: Midge Aver MSN RN CNS WTA Entered By: Midge Aver on 06/09/2023 07:45:38 -------------------------------------------------------------------------------- Vitals Details Patient Name: Date of Service: Chad [name] 06/09/2023 10:15 A M Medical Record Number: [id number] Patient Account Number: [id number] Date of Birth/Sex: Treating RN: 17-May-1961 (62 y.o. [name] Primary Care Krishna Dancel: [name] Other Clinician: Referring Kanyon Bunn: Treating Brinae Woods/Extender: [name] Weeks in Treatment: 78 Vital Signs Time Taken: 10:20 Temperature (F): 97.7 Height (in): 69 Pulse (bpm): 93 Weight (lbs): 150 Respiratory Rate (breaths/min): 18 Body Mass Index (BMI): 22.1 Blood Pressure (mmHg): 95/74 Reference Range: 80 - 120 mg / dl Electronic Signature(s) Signed: 06/09/2023 4:52:48 PM By: Midge Aver MSN RN CNS WTA Entered By: Midge Aver on 06/09/2023 07:35:42

## 2023-06-09 NOTE — Progress Notes (Addendum)
Chad [name], STUVER ([id number]) [id number] Page 1 of 16 Visit Report for 06/09/2023 Chief Complaint Document Details Patient Name: Date of Service: CREDENCE, GIANNOTTI 06/09/2023 10:15 A M Medical Record Number: [id number] Patient Account Number: [id number] Date of Birth/Sex: Treating RN: 09/26/1960 (62 y.o. Chad [name] Primary Care Provider: [name] Other Clinician: Referring Provider: Treating Provider/Extender: [name] Weeks in Treatment: 86 Information Obtained from: Patient Chief Complaint Left ankle ulcer, Left dorsal foot wound, right heel Electronic Signature(s) Signed: 06/09/2023 10:15:03 AM By: [name] PA-C Entered By: [name] on 06/09/2023 10:15:03 -------------------------------------------------------------------------------- HPI Details Patient Name: Date of Service: Chad [name] BERT 06/09/2023 10:15 A M Medical Record Number: [id number] Patient Account Number: [id number] Date of Birth/Sex: Treating RN: 1961/04/15 (62 y.o. Chad [name] Primary Care Provider: [name] Other Clinician: Referring Provider: Treating Provider/Extender: [name] Weeks in Treatment: 34 History of Present Illness HPI Description: 10/08/18 on evaluation today patient actually presents to our office for initial evaluation concerning wounds that he has of the bilateral lower extremities. 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 He also has bilateral venous stasis. He apparently has been homeless currently following discharge from the hospital apparently he has been placed at [facility] which is is a skilled nursing facility locally. Nonetheless fortunately he does not show any signs of infection at this time which is good news. In fact several of the  wound actually appears to be showing some signs of improvement already in my pinion. There are a couple areas in the left leg in particular there likely gonna require some sharp debridement to help clear away some necrotic tissue and help with more sufficient healing. No fevers, chills, nausea, or vomiting noted at this time. 10/15/18 on evaluation today patient actually appears to be doing very well in regard to his bilateral lower extremities. He's been tolerating the dressing changes without complication. Fortunately there does not appear to be any evidence of active infection at this time which is great news. Overall I'm actually very pleased with how this has progressed in just one visits time. Readmission: 08/14/2020 upon evaluation today patient presents for re-evaluation here in our clinic. He is having issues with his left ankle region as well as his right toe and his right heel. He tells me that the toe and heel actually began as a area that was itching that he was scratching and then subsequently opened up into wounds. These may have been abscess areas I presume based on what I am seeing currently. With regard to his left ankle region he tells me this was a similar type KNOLAN, FALZON ([id number]) [id number] Page 2 of 16 occurrence although he does have venous stasis this very well may be more of a venous leg ulcer more than anything. Nonetheless I do believe that the patient would benefit from appropriate and aggressive wound care to try to help get things under better control here. He does have history of a stroke on the left side affecting him to some degree there that he is able to stand although he does have some residual weakness. Otherwise again the patient does have chronic venous insufficiency as previously noted. His arterial studies most recently obtained showed that he had an ABI on the right of 1.16 with a TBI of 0.52 and on the left and ABI of 1.14  with a TBI of 0.81. That was obtained on 06/19/2020. 08/28/2020 upon evaluation today patient appears to be doing decently well in regard to his wounds in general. He has been tolerating the dressing changes without complication. Fortunately there does not appear to be any signs of active infection which is great news. With that being said I think the [facility] is doing a good job I would recommend that we likely continue with that currently. 09/11/2020 upon evaluation today patient's wounds did not appear to be doing too poorly but again he is not really showing signs of significant improvement with regard to any of the wounds on the right. None of them have Hydrofera Blue on them I am not exactly sure why this is not being followed as the facility did not contact us to let us know of any issues with obtaining dressings or otherwise. With that being said he is supposed to be using Hydrofera Blue on both of the wounds on the right foot as well as the ankle wound on the left side. 09/18/2020 upon evaluation today patient appears to be doing poorly with regard to his wounds. Again right now the left ankle in particular showed signs of extreme maceration. Apparently he was told by someone with staff at [facility] they could not get the [facility]. With that being said this is something that is never been relayed to [place] one way or another. Also the patient subsequently has not supposed to have a border gauze dressing on. He should have an ABD pad and roll gauze to secure as this drains much too much just to have a border gauze dressing to cover. Nonetheless the fact that they are not using the appropriate dressing is directly causing deterioration of the left ankle wound it is significantly worse today compared to what it was previous. I did attempt to call [facility] while the patient was here I called three times and got no one to even pick up the phone. After this I had my for an  office coordinator call and she was able to finally get through and leave a message with the D ON as of dictation of this note which is roughly about an hour and a half later I still have not been able to speak with anyone at the facility. 09/25/2020 upon evaluation today patient actually showing signs of good improvement which is excellent news. He has been tolerating the dressing changes without complication. Fortunately there is no signs of active infection which is great news. No fevers, chills, nausea, vomiting, or diarrhea. I do feel like the facility has been doing a much better job at taking care of him as far as the dressings are concerned. However the director of nursing never did call me back. 10/09/2020 upon evaluation today patient appears to be doing well with regard to his wound. The toe ulcer did require some debridement but the other 2 areas actually appear to be doing quite well. 10/19/2020 upon evaluation today patient actually appears to be doing very well in regard to his wounds. In fact the heel does appear to be completely healed. The toe is doing better in the medial ankle on the left is also doing better. Overall I think he is headed in the right direction. 10/26/2020 upon evaluation today patient appears to be doing well with regard to his wound. He is showing signs of improvement which is great news and overall I am very pleased with where things stand today. No  fevers, chills, nausea, vomiting, or diarrhea. 11/02/2020 upon evaluation today patient appears to be doing well with regard to his wounds. He has been tolerating the dressing changes without complication overall I am extremely pleased with where things stand today. He in regard to the toe is almost completely healed and the medial ankle on the left is doing much better. 11/09/2020 upon evaluation today patient appears to be doing a little poorly in regard to his left medial ankle ulcer. Fortunately there does not appear to  be any signs of systemic infection but unfortunately locally he does appear to be infected in fact he has blue-green drainage consistent with Pseudomonas. 11/16/2020 upon evaluation today patient appears to be doing well with regard to his wound. It actually appears to be doing better. I did place him on gentamicin cream since the Cipro was actually resistant even though he was positive for Pseudomonas on culture. Overall I think that he does seem to be doing better though I am unsure whether or not they have actually been putting the cream on. The patient is not sure that we did talk to the nurse directly and she was going to initiate that treatment. Fortunately there does not appear to be any signs of active infection at this time. No fevers, chills, nausea, vomiting, or diarrhea. 4/28; the area on the right second toe is close to healed. Left medial ankle required debridement 12/07/2020 upon evaluation today patient appears to be doing well with regard to his wounds. In fact the right second toe appears to be completely healed which is great news. Fortunately there does not appear to be any signs of active infection at this time which is also great news. I think we can probably discontinue the gentamicin on top of everything else. 12/14/2020 upon evaluation today patient appears to be doing well with regard to his wound. He is making good progress and overall very pleased with where things stand today. There is no signs of active infection at this time which is great news. 12/28/2020 upon evaluation today patient appears to be doing well with regard to his wounds. He has been tolerating the dressing changes without complication. Fortunately there is no signs of active infection at this time. No fevers, chills, nausea, vomiting, or diarrhea. 12/28/2020 upon evaluation today patient's wound bed actually showed signs of excellent improvement. He has great epithelization and granulation I do not see any  signs of infection overall I am extremely pleased with where things stand at this point. No fevers, chills, nausea, vomiting, or diarrhea. 01/11/2021 upon evaluation today patient appears to be doing well with regard to his wound on his leg. He has been tolerating the dressing changes without complication. Fortunately there does not appear to be any signs of active infection which is great news. No fevers, chills, nausea, vomiting, or diarrhea. 01/25/2021 upon evaluation today patient appears to be doing well with regard to his wound. He has been tolerating the dressing changes without complication. Fortunately the collagen seems to be doing a great job which is excellent news. No fevers, chills, nausea, vomiting, or diarrhea. 02/08/2021 upon evaluation today patient's wound is actually looking a little bit worse especially in the periwound compared to previous. Fortunately there does not appear to be any signs of infection which is great news with that being said he does have some irritation around the periphery of the wound which has me more concerned. He actually had a dressing on that had not been changed in 3  days. He also is supposed to have daily dressing changes. With regard to the dressing applied he had a silver alginate dressing and silver collagen is what is recommended and ordered. He also had no Desitin around the edges of the wound in the periwound region although that is on the order inspect to be done as well. In general I was very concerned I did contact  healthcare actually spoke with Verlon Au who is the scheduling individual and subsequently she stated that she would pass the information to the D ON apparently the D ON was not available to talk to me when I call today. 02/18/2021 upon evaluation today patient's wound is actually showing signs of improvement. Fortunately there does not appear to be any evidence of infection which is great news overall I am extremely pleased with  where things stand today. No fevers, chills, nausea, vomiting, or diarrhea. 8/3; patient presents for 1 week follow-up. He has no issues or complaints today. He denies signs of infection. 03/11/2021 upon evaluation today patient appears to be doing well with regard to his wound. He does have a little bit of slough noted on the surface of the wound but fortunately there does not appear to be any signs of active infection at this time. No fevers, chills, nausea, vomiting, or diarrhea. 03/18/2021 upon evaluation today patient appears to be doing well with regard to his wound. He has been tolerating the dressing changes without complication. There was a little irritation more proximal to where the wound was that was not noted last week but nonetheless this is very superficial just seems to be more irritation we just need to make sure to put a good amount of the zinc over the area in my opinion. Otherwise he does not seem to be doing significantly worse at all which is great news. 03/25/2021 upon evaluation today patient appears to be doing well with regard to his wound. He is going require some sharp debridement today to clear with REAFORD, MIODUSZEWSKI ([id number]) [id number] Page 3 of 16 some of the necrotic debris. I did perform this today without complication postdebridement wound bed appears to be doing much better this is great news. 04/08/2021 upon evaluation today patient appears to be doing decently well in regard to his wound although the overall measurement is not significantly smaller compared to previous. It is gone down a little bit but still the facility continues to not really put the appropriate dressings in place in fact he was supposed to have collagen we think he probably had more of an allergy to At this point. Fortunately there does not appear to be any signs of active infection systemically though locally I do not see anything on initial visualization either as  far as erythema or warmth. 04/15/2021 upon evaluation today patient appears to be doing well with regard to his wound. He is actually showing signs of improvement. I did place him on antibiotics last week, Cipro. He has been taking that 2 times a day and seems to be tolerating it very well. I do not see any evidence of worsening and in fact the overall appearance of the wound is smaller today which is also great news. 9/26; left medial ankle chronic venous insufficiency wound is improved. Using Hydrofera Blue 10/10; left medial ankle chronic venous insufficiency. Wound has not changed much in appearance completely nonviable surface. Apparently there have been problems getting the right product on the wound at the facility although he came in with [facility] on today  05/14/2021 upon evaluation today patient appears to be doing well with regard to his wound. I think he is making progress here which is good news. Fortunately there does not appear to be any signs of active infection at this time. No fevers, chills, nausea, vomiting, or diarrhea. 05/20/2021 upon evaluation today patient appears to be doing well with regard to his wound. He is showing signs of good improvement which is great news. There does not appear to be any evidence of active infection which is also excellent news. No fevers, chills, nausea, vomiting, or diarrhea. 05/28/2021 upon evaluation today patient appears to be doing quite well. There does not appear to be any signs of active infection at this time which is great news. Overall I am extremely pleased with where things stand today. I think he is headed in the right direction. 06/11/2021 upon evaluation today patient appears to be doing well with regard to his left ankle ulcer and poorly in regard to the toe ulcer on the second toe right foot. This appears to show signs of joint exposure. Apparently this has been present for 1 to 2 months although he kept forgetting to tell me  about it. That is unfortunate as right now it definitely appears to be doing significantly worse than what I would like to see. There does not appear to be any signs of active infection systemically though locally I am concerned about the possibility of infection the toe is quite red. Again no one from the facility ever contacted [place] to advise that this was going on in the interim either. 06/17/2021 upon evaluation today patient presents for follow-up I did review his x-ray which showed a navicular bone fracture I am unsure of the chronicity of this. Subsequently he also had osteomyelitis of the toe which was what I was more concerned about this did not show up on x-ray but did show up on the pathology scrapings. This was listed as acute osteomyelitis. Nonetheless at this point I think that the antibiotic treatment is the best regimen to go with currently. The patient is in agreement with that plan. Nonetheless he has initially 30 days of doxycycline off likely extend that towards the end of the treatment cycle that will be around the middle of December for an additional 2 weeks. That all depends on how well he continues to heal. Nonetheless based on what I am seeing in the foot I did want a proceed with an MRI as well which I think will be helpful to identify if there is anything else that needs to be addressed from the standpoint of infection. 06/24/2021 upon evaluation today patient appears to be doing pretty well in regards to his wounds. I think both are actually showing signs of improvement which is good I did review his MRI today which did show signs of osteomyelitis of the middle and proximal phalanx on his right foot of the affected toe. With that being said this is actually showing signs of significant improvement today already with the antibiotic therapy I think the redness is also improved. Overall I think that we just need to give this some time with appropriate wound care we will see how  things go potentially hyperbarics could be considered. 07/02/2021 upon inspection today patient actually appears to be doing well in regard to his left ankle which is getting very close to complete resolution of pleased in that regard. Unfortunately he is continuing to have issues with his second toe right foot and this seems  to still be very painful for him. Recommend he try something different from the standpoint of antibiotics. 07/15/2021 upon evaluation today patient appears to be doing actually pretty well in regard to his foot. This is actually showing signs of significant improvement which is great news. Overall I feel like the patient is improving both in regard to the second toe as well as the ankle on the left. With that being said the biggest issue that I do see currently is that he is needing to have a refill of the doxycycline that we previously treated him with. He also did see podiatry they are not going to recommend any amputation at this point since he seems to be doing quite well. For that reason we just need to keep things under control from an infection standpoint. 08/01/2021 upon evaluation today patient appears to be doing well with regard to his wound. He has been tolerating the dressing changes without complication. Fortunately there does not appear to be any evidence of active infection locally nor systemically at this point. In fact I think everything is doing excellent in fact his second toe on the right foot is almost healed and the ankle on the left ankle region is actually very close to being healed as well. 08/08/2021 upon evaluation today patient appears to be doing well with regard to his wound. He has been tolerating the dressing changes without complication. Fortunately I do not see any signs of active infection at this time. Readmission: 12-06-2021 upon evaluation today patient presents for reevaluation here in the clinic he does tell me that he was being seen in  facility at [facility] by a provider that was coming in. He is not sure who this was. He tells me however that the wound seems to have gotten worse even compared to where it was when we last saw him at this point. With that being said I do believe that he is likely going need ongoing wound care here in the clinic and I do believe that we need to be the ones to frontline this since his wound does seem to be getting worse not better at this point. He voiced understanding. He is also in agreement with this plan and feels more comfortable coming here she tells me. Patient's medical history really has not changed since his prior admission he was only gone since January. 12-27-2021 upon evaluation today patient appears to be doing well with regard to his wound they did run out of the [facility] so they did not put anything on just an ABD pad with gentamicin. Still we are seeing some signs of good improvement here with some new epithelization which is great news. 01-10-2022 upon evaluation today patient appears to be doing well with regard to his wounds and he is going require some sharp debridement but overall seems to be making good progress. Fortunately I do not see any evidence of active infection locally or systemically at this time which is great news. 01-24-2022 upon evaluation today patient appears to be doing well with regard to his wound. The facility actually came and dropped him off early and he had another appointment at the hospital and then they just brought him over here and this was still hours before his appointment this afternoon. For that reason we did do our best to work him in this morning and fortunately had some space to make this happen. With that being said patient's wound does seem to be making progress here and I  am very pleased in that regard I do not see any signs of active infection locally or systemically at this time. 02-07-2022 patient appears to be doing well  currently in regard to his wounds. In fact one of them the more proximal is healed the distal is still open but seems to be doing excellent. Fortunately I do not see any evidence of active infection locally or systemically at this time which is great news. No fevers, chills, nausea, vomiting, or diarrhea. 7/27; left medial ankle venous. Improving per our intake nurse. We are using Hydrofera Blue under Tubigrip compression. They are changing that at his facility 03-06-2022 upon evaluation today patient appears to be doing well with regard to the his wound he is can require some sharp debridement but seems to be making excellent progress. Fortunately I do not see any evidence of active infection locally or systemically which is great news. 03-27-2022 upon evaluation today patient's wound is actually showing signs of excellent improvement. Fortunately I see no signs of active infection locally or systemically at this time which is great news. No fevers, chills, nausea, vomiting, or diarrhea. VI, Chad [name] ([id number]) [id number] Page 4 of 16 04-21-2022 upon evaluation today patient appears to be doing excellent in regard to his wound in fact this is very close to resolution based on what I am seeing. I do not see any evidence of active infection locally or systemically at this time which is great news and overall I am extremely pleased with where we are today. 05-05-2022 upon evaluation today patient's wound actually appears to potentially be completely healed. Fortunately I do not see any evidence of active infection at this time which is great news and overall I am very pleased I think this needs a little time to toughen up but other than that I really do believe were doing quite well. He is very pleased to hear this its been a long time coming. 05-19-2022 upon evaluation today patient appears to be doing well currently in regard to his wound. He in fact we were hoping will  be completely healed and closed but it still has a very tiny area right in the center which is still continuing to drain. Fortunately I do not see any signs of infection locally or systemically which is great news. 11/6; this wound is close to completely" he comes in this week with some erythema at 1 edge of this which looks like something was rubbing on the area. Other than that no evidence of infection 06-16-2022 upon evaluation today patient appears to be doing well currently in regard to his ankle ulcer. This is very close to being healed but still has a small area in the central portion which is not. Fortunately there does not appear to be any signs of infection locally or systemically which is great news. No fevers, chills, nausea, vomiting, or diarrhea. 06-30-2022 upon evaluation patient's wound pretty much appears to be almost completely closed. In fact it may even be closed but there are still a lot of skin irritation around the edges of the wound and I just do not feel great about releasing them yet I would like to continue to monitor this just a little bit longer before getting to that point. He is not opposed to this and in fact is happy to continue to come in if need be in order to make sure that things are moving in the right direction he definitely does not want to backtrack. 07-14-2022 upon evaluation today patient  appears to be doing well currently in regard to his wound. Has been tolerating the dressing changes without complication. Fortunately I see no evidence of active infection locally nor systemically which is great news. No fevers, chills, nausea, vomiting, or diarrhea. 08-11-2022 upon evaluation today patient appears to be doing a little worse than last time I saw him although it has been a month. Unfortunately he was not able to be seen due to the fact that he was quarantined in the facility with COVID. Subsequently he tells me that he mention to the facility staff several  times about taking it off over the past several weeks but they continue to tell him that someone have to get in touch with [place] here although nobody ever did until Friday when the nurse manager called to have that documented in the communication notes. With that being said unfortunately this means that he had a wrap on that he was supposed to have on for only 1 week for ended up being on four 1 month essentially. I am glad that there is nothing worse than what we see currently to be perfectly honest. 08-19-2022 upon evaluation today patient appears to be doing well currently in regard to his wounds. He has been tolerating the dressing changes without complication. This is actually smaller today which is great news after last week's reopening. Fortunately I do not see any evidence of infection locally nor systemically at this point. 08-26-2022 upon evaluation today patient appears to be doing well currently in regard to his wound. He has been tolerating the dressing changes without complication. Fortunately there does not appear to be any signs of active infection locally nor systemically which is great news. No fevers, chills, nausea, vomiting, or diarrhea. 09-02-2022 upon evaluation today patient's wound actually showing signs of excellent improvement this is measuring smaller and looking much better. I am very pleased with where we stand I do believe that we are headed in the right direction. 09-09-2022 upon evaluation today patient appears to be doing decently well in regard to his leg in general although there was some swelling this is the 1 thing that I am not too happy with based on what I see currently. Fortunately there does not appear to be any signs of infection locally nor systemically at this point. 09-16-2022 upon evaluation today patient appears to be doing well currently in regard to his wound. He is actually showing signs of improvement he wore the Tubigrip this week and it significantly  better compared to last week's evaluation. Fortunately I do not see any evidence of active infection locally or systemically which is great news. Overall I think that were headed in the right direction which is great news. In fact overall I think this is the best that he has looked in some time. He does not like the Tubigrip but actually did extremely well with this in my opinion. I think he needs to continue to utilize this. 09-23-2022 upon evaluation today patient appears to be doing well currently in regard to his wound. He has been tolerating the dressing changes without complication. Fortunately there does not appear to be any signs of active infection locally nor systemically which is great news and in general I do feel like that we are headed in the right direction. He has been having trouble with the Tubigrip however we have gotten the facility to order compression socks he has not gotten them as of yet. 3/4; patient is about the same with a wound  on the left posterior leg. This is a venous wound. He needs more compression on this leg but he took off the previous wraps we are using. I am not certain whether he is using juxta lite stockings at the nursing home. 3/12; patient presents for follow-up. He unfortunately has 2 new wounds 1 to the dorsal left side and the other to the right heel. He is not sure how these happened. The right heel appears to be caused by pressure. He has been using Hydrofera Blue to the original wound on the left leg. 10-14-2022 upon evaluation today patient appears to be doing well currently in regard to his original wound but unfortunately he has several new wounds which I was completely unaware of before walking into the room to see him today. Unfortunately he seems to not be doing nearly as well as what I saw when I last had an appointment with him 3 weeks ago. Fortunately I do not see any signs of systemic infection though unfortunately locally he has definite  infection of the left lower extremity. He is currently on doxycycline I am going to likely have to extend that today. 10-21-2022 upon evaluation today patient appears to be doing well currently in regard to his wounds. He seems to be tolerating the dressing changes without complication. Fortunately there does not appear to be any signs of active infection locally or systemically which is great news and in general I definitely feel like we are on the right track here. 10-30-2022 upon evaluation today patient still continues to not be doing too well at all with regard to his wounds. Unfortunately he is still having signs of infection as well which also has been concerned. Fortunately I do not see any signs of active infection locally nor systemically at this time. No fevers, chills, nausea, vomiting, or diarrhea. 11-06-2022 upon evaluation today patient appears to be doing okay currently in regard to his wounds. All things considered with his blood flow not being nearly as good as what should I think that he is making pretty good progress here. Fortunately I do not see any signs of infection locally nor systemically which is great news. No fevers, chills, nausea, vomiting, or diarrhea. 11-13-2022 upon evaluation today patient appears to be doing poorly currently in regard to his wounds. Things seem to be getting a little bit worse not better. 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 Subsequently he is also going to need to have vascular intervention but we are unsure exactly when this is going on the undertaking. I would try to actually send a message to Dr. Hazle Quant and the vascular team just to see what exactly the plan is going forward. The patient is in agreement with that plan I just think we need to have a plan especially in light of the fact the left leg seems to be getting a little bit worse at this  point. 11-25-2022 upon evaluation today patient is currently now discharged from the hospital following his colostomy procedure. That did very well and he seems to [facility] ([id number]) [id number] Page 5 of 16 be doing well. Fortunately there does not appear to be any signs of active infection locally or systemically which is great news. No fevers, chills, nausea, vomiting, or diarrhea. 12-04-2022 upon evaluation today patient appears to be doing actually decently well in regard to his wounds and actually very pleased  with where we stand and I think that he is making some pretty good progress here. Fortunately I do not see any signs of active infection locally nor systemically which is great news. No fevers, chills, nausea, vomiting, or diarrhea. 12-12-2022 upon evaluation today patient appears to be doing well currently in regard to his wound. He has been tolerating the dressing changes without complication. The wounds all seem to be making some progress to a degree. He has appointment with vascular on the 23rd of this month. 5/24; the patient was scheduled for a left lower extremity angiogram yesterday but apparently they were overbooked and they wanted to admit him to hospital last night so they could do this early this morning but he refused I see that Dr. Wyn Quaker has rescheduled him for June 6. He is using silver alginate to wounds on his bilateral lower extremities. 01-02-2023 upon evaluation today patient appears to be doing poorly in regard to his wounds still. Subsequently he did have his angiogram but apparently there really was not much that was found that could be revascularized. The patient tells me that Dr. Wyn Quaker was going to contact me here in the office I have not heard from him as of yet and I may see about giving him a call just to follow-up or possibly send a message through epic which could be an option as well. 01-09-2023 upon evaluation today patient  appears to be doing poorly in regard to his legs he is extremely swollen. He has been sleeping in his chair he tells me for the past week. His bed is broken and they have ordered a new one which is supposedly supposed to be there today. With that being said he the way he needs to be not sleeping in his chair that is his wheelchair every day and through the night. This is causing his legs to be much more swollen and it seen and how badly wounds are doing at this point. This has made things significantly worse. Fortunately I do not see any signs of infection right now unfortunately I do believe that the patient has a tremendous amount of drainage currently. 01-19-2023 upon evaluation today patient appears to be doing poorly in regard to his wounds of his legs his swelling is better but still not as good as what we would like to see. Fortunately I do not see any evidence of active infection locally nor systemically which is great news and in general I do believe that we are moving in the right direction here. Nonetheless it would be better if she was in the bed sleeping or not send up in his wheelchair although he seems to still be set up in the wheelchair at this time. He tells me that he is just not as comfortable in the bed as the wheelchair but unfortunately this is causing damage to his legs which I discussed with him today as well. Nonetheless I am not sure that he is going to be amendable to doing anything different. 01-27-2023 upon evaluation today patient appears to be doing well currently. Fortunately I do not see any evidence of active infection locally nor systemically which is great news I think he is actually doing the best option and while in fact I would try to see if we can do some debridement today to get things moving along. 02-03-2023 upon evaluation today patient's wounds unfortunately are not doing nearly as well. Will be doing the [employer] wraps unfortunately we just not seeing the  improvement that I would like to see. Fortunately I do not see any evidence of infection at this time which is good news unfortunately I do think that he needs to be changed on a daily basis due to the amount of drainage otherwise I do not think this is really going to continue to improve dramatically. 02-12-2023 upon evaluation today patient appears to be doing better in a lot of ways in regard to his wounds and although his toes on the left side actually seem a bit worse. Fortunately I do not see any evidence of active infection locally nor systemically at this time which is good news. With that being said he is having some bleeding from the left leg I think that this is an area that looks like it probably got bumped. With that being said I discussed this with the patient he tells me he does not know of any trauma that occurred but at the same time if was not this that it was skin that got pulled off. Dressing change there is only one of the 2 that could have happened. 7/25; this is a patient who is very frail at [facility] nursing facility. He is nonambulatory. He has wounds on his bilateral lower legs. However much worse on the left we have been using silver cell to all of his wounds He comes in today and a lot more pain our intake nurse reports deterioration of the left foot. I ended up looking through some of his record. 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 I am really not certain of the stage of  this currently or who is following 02-26-2023 upon evaluation today patient unfortunately appears to be doing significantly worse compared to where he was previous. The left foot is showing signs of dramatic worsening in regard to the overall appearance his toes are starting to become necrotic almost in full and he has much more cyanosis present than what I even seen previous. He is already seeing Dr. Wyn Quaker and unfortunately there was not anything that they saw that was an immediate cause for this but at the same time I feel like this may have changed and even worsened I feel like this could be a vascular event and I believe this is a limb-threatening situation. 03/26/2023; I last saw this patient on 8/1. Wondered about below-knee amputations versus palliative care. 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 The wound has healed fairly well and he follows up with surgery today. He is back today looking at wounds on the right foot and lower leg. This includes 2 areas on the right heel, right dorsal foot right dorsal first and second toes and a small area on the right anterior lower tibia area. He has a lot of pain in this leg as well. The angiogram he had on 01/01/2023 specific to the right leg now demonstrated a fairly normal common femoral artery, profunda femoris artery and superficial and popliteal arteries without significant disease. 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 No doubt this has a lot to do with the discomfort in the right leg he describes when he is in bed. They have been using Xeroform on the wounds on the right. He has a lot of swelling in this leg with weeping edema 04-09-2023 upon evaluation today patient appears to be doing poorly still in regard to his right leg. 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  Fortunately I do not see any signs of active infection locally or systemically at this time which is great news. No fevers, chills, nausea, vomiting, or diarrhea. With that being said I am concerned still about the fact that he seems to be getting worse especially in regard to the heels I think that he is still sitting up most of the day which I think is still causing some swelling of his leg unfortunately. 04-16-2023 upon evaluation today patient actually appears to be doing little bit more poorly compared to last week this actually does appear to be infected. Last week I really did not think that was the case but this seems like it may indeed be the case. Fortunately I do not see any signs of active infection locally or systemically at this time. 04-24-2023 upon evaluation today patient appears to be doing well currently in regard to his leg compared to where we were previous I do feel like that the antibiotics have been of benefit. Fortunately I do not see any signs of active infection at this time which is great news and in general I believe that we are making headway towards healing which is great news as well. With that being said he still has a lot of drainage I really feel like something like Anola Gurney would be better for him than just an ABD pad it would likely the drainage more or even Zetuvit's but I am not sure whether we can get either 1 of these at the facility for him. 05-01-2023 upon evaluation today patient appears to be doing well currently in regard to his wounds which are showing signs of significant improvement is not any way shape or form infected like it was last time I saw him. I think the antibiotics are doing a good job but I think he may need an extension here to ensure HASSANI, LAMBROU ([id number]) [id number] Page 6 of 16 that it completely clears. 05-08-2023 upon evaluation today patient appears to be doing well currently in regard to his wounds for  the most part. He still has blue-green drainage noted but in general seems to be doing much better compared to where we were previous. Fortunately I do not see any signs of active infection locally or systemically which is great news and in general I do believe that we are making good headway towards complete closure. 05-18-2023 upon evaluation today patient appears to be doing well currently in regard to his wounds I feel like he is definitely making some improvements here. This is a slow improvement but nonetheless improvement. I do believe that he is doing quite well as far as infection is concerned which is also good news. 05-25-2023 upon evaluation today patient actually appears to be doing worse in regard to his legs at this point. Unfortunately he continues to have issues with significant drainage which is the primary concern here. I do feel like that the wound currently is not doing a whole lot better  when he was on the Levaquin I felt like it was improving some but the concern there is QT prolongation I do not want to continue that personally further I think he needs a referral ASAP to infectious disease. I discussed that with him today. He also needs to be having this changed every day and that has not been happening at this point. That is my biggest concern here. 06-09-2023 upon evaluation today patient appears to be doing still poorly in regard to this right leg which is draining profusely. This is unfortunately just continuing to show signs of being very moist and wet and weeping quite significantly. This is despite everything that we are doing including the facility have been placed him on Levaquin by ID which unfortunately just does not seem to be helping to heal this as well as we would hope either. I do have an infectious disease appointment that we sent 10 for him and that is actually scheduled for next week on the 19th. He has an appointment with vascular on the 14th of this week  although I think they are looking at his left leg primarily at the amputation site. Electronic Signature(s) Signed: 06/09/2023 11:11:48 AM By: [name] PA-C Entered By: [name] on 06/09/2023 11:11:48 -------------------------------------------------------------------------------- Physical Exam Details Patient Name: Date of Service: TONYA, KOPAS 06/09/2023 10:15 A M Medical Record Number: [id number] Patient Account Number: [id number] Date of Birth/Sex: Treating RN: 1961/06/15 (62 y.o. Chad [name] Primary Care Provider: [name] Other Clinician: Referring Provider: Treating Provider/Extender: [name] Weeks in Treatment: 83 Constitutional Well-nourished and well-hydrated in no acute distress. Respiratory normal breathing without difficulty. Psychiatric this patient is able to make decisions and demonstrates good insight into disease process. Alert and Oriented x 3. pleasant and cooperative. Notes Upon evaluation patient unfortunately is continuing to have significant issues with swelling here. I am concerned about this and think that he probably needs to most likely still see infectious disease even with the IV antibiotics I do not feel like this is improving as much as I would like to see to be honest. Electronic Signature(s) Signed: 06/09/2023 11:13:08 AM By: [name] PA-C Entered By: [name] on 06/09/2023 11:13:08 [name] ([id number]) [id number] Page 7 of 16 -------------------------------------------------------------------------------- Physician Orders Details Patient Name: Date of Service: WAYDEN, LINSTROM 06/09/2023 10:15 A M Medical Record Number: [id number] Patient Account Number: [id number] Date of Birth/Sex: Treating RN: 1961/01/24 (63 y.o. 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 Nurse Visit as needed Bathing/ Shower/ Hygiene Clean wound with Normal Saline or wound cleanser. No tub bath. Anesthetic (Use 'Patient Medications' Section for Anesthetic Order Entry) Lidocaine applied to wound bed Off-Loading Heel suspension boot - when in bed Turn and reposition every 2 hours Medications-Please add to medication list. ntibiotics - RX renewed 05/25/2023 P.O. A Wound Treatment Wound #16 -  Calcaneus Wound Laterality: Right Cleanser: Soap and Water 1 x Per Day/30 Days Discharge Instructions: Gently cleanse wound with antibacterial soap, rinse and pat dry prior to dressing wounds Cleanser: Wound Cleanser 1 x Per Day/30 Days Discharge Instructions: Wash your hands with soap and water. Remove old dressing, discard into plastic bag and place into trash. Cleanse the wound with Wound Cleanser prior to applying a clean dressing using gauze sponges, not tissues or cotton balls. Do not scrub or use excessive force. Pat dry using gauze sponges, not tissue or cotton balls. Secondary Dressing: Zetuvit Plus 4x8 (in/in) 1 x Per Day/30 Days Secondary Dressing: Heel Cup 1 x Per Day/30 Days Secured With: Kerlix Roll Sterile or Non-Sterile 6-ply 4.5x4 (yd/yd) 1 x Per Day/30 Days Discharge Instructions: Apply Kerlix as directed Secured  With: Tubigrip Size D, 3x10 (in/yd) 1 x Per Day/30 Days Wound #18 - T Second oe Wound Laterality: Right, Medial Cleanser: Soap and Water 1 x Per Day/30 Days Discharge Instructions: Gently cleanse wound with antibacterial soap, rinse and pat dry prior to dressing wounds [name] ([id number]) [id number] Page 8 of 16 Cleanser: Wound Cleanser 1 x Per Day/30 Days Discharge Instructions: Wash your hands with soap and water. Remove old dressing, discard into plastic bag and place into trash. Cleanse the wound with Wound Cleanser prior to applying a clean dressing using gauze sponges, not tissues or cotton balls. Do not scrub or use excessive force. Pat dry using gauze sponges, not tissue or cotton balls. Secondary Dressing: Zetuvit Plus 4x8 (in/in) 1 x Per Day/30 Days Secondary Dressing: Heel Cup 1 x Per Day/30 Days Secured With: Kerlix Roll Sterile or Non-Sterile 6-ply 4.5x4 (yd/yd) 1 x Per Day/30 Days Discharge Instructions: Apply Kerlix as directed Secured With: Tubigrip Size D, 3x10 (in/yd) 1 x Per Day/30 Days Wound #21 - Foot Wound Laterality: Dorsal, Right Cleanser: Soap and Water 1 x Per Day/30 Days Discharge Instructions: Gently cleanse wound with antibacterial soap, rinse and pat dry prior to dressing wounds Cleanser: Wound Cleanser 1 x Per Day/30 Days Discharge Instructions: Wash your hands with soap and water. Remove old dressing, discard into plastic bag and place into trash. Cleanse the wound with Wound Cleanser prior to applying a clean dressing using gauze sponges, not tissues or cotton balls. Do not scrub or use excessive force. Pat dry using gauze sponges, not tissue or cotton balls. Secondary Dressing: Zetuvit Plus 4x8 (in/in) 1 x Per Day/30 Days Secondary Dressing: Heel Cup 1 x Per Day/30 Days Secured With: Kerlix Roll Sterile or Non-Sterile 6-ply 4.5x4 (yd/yd) 1 x Per Day/30 Days Discharge Instructions: Apply Kerlix as directed Secured With:  Tubigrip Size D, 3x10 (in/yd) 1 x Per Day/30 Days Wound #22 - Lower Leg Wound Laterality: Right, Anterior Cleanser: Soap and Water 1 x Per Day/30 Days Discharge Instructions: Gently cleanse wound with antibacterial soap, rinse and pat dry prior to dressing wounds Cleanser: Wound Cleanser 1 x Per Day/30 Days Discharge Instructions: Wash your hands with soap and water. Remove old dressing, discard into plastic bag and place into trash. Cleanse the wound with Wound Cleanser prior to applying a clean dressing using gauze sponges, not tissues or cotton balls. Do not scrub or use excessive force. Pat dry using gauze sponges, not tissue or cotton balls. Secondary Dressing: Zetuvit Plus 4x8 (in/in) 1 x Per Day/30 Days Secondary Dressing: Heel Cup 1 x Per Day/30 Days Secured With: Kerlix Roll Sterile or Non-Sterile 6-ply 4.5x4 (yd/yd) 1 x Per Day/30 Days Discharge Instructions: Apply Kerlix as  directed Secured With: Tubigrip Size D, 3x10 (in/yd) 1 x Per Day/30 Days Wound #23 - Lower Leg Wound Laterality: Right, Posterior Cleanser: Soap and Water 1 x Per Day/30 Days Discharge Instructions: Gently cleanse wound with antibacterial soap, rinse and pat dry prior to dressing wounds Cleanser: Wound Cleanser 1 x Per Day/30 Days Discharge Instructions: Wash your hands with soap and water. Remove old dressing, discard into plastic bag and place into trash. Cleanse the wound with Wound Cleanser prior to applying a clean dressing using gauze sponges, not tissues or cotton balls. Do not scrub or use excessive force. Pat dry using gauze sponges, not tissue or cotton balls. Secondary Dressing: Zetuvit Plus 4x8 (in/in) 1 x Per Day/30 Days Secondary Dressing: Heel Cup 1 x Per Day/30 Days Secured With: Kerlix Roll Sterile or Non-Sterile 6-ply 4.5x4 (yd/yd) 1 x Per Day/30 Days Discharge Instructions: Apply Kerlix as directed Secured With: Tubigrip Size D, 3x10 (in/yd) 1 x Per Day/30 Days Electronic  Signature(s) Signed: 06/09/2023 4:52:48 PM By: Midge Aver MSN RN CNS WTA Signed: 06/11/2023 7:57:50 AM By: [name] PA-C Entered By: Midge Aver on 06/09/2023 11:21:13 [name] ([id number]) [id number] Page 9 of 16 -------------------------------------------------------------------------------- Problem List Details Patient Name: Date of Service: CAESAR, MONGE 06/09/2023 10:15 A M Medical Record Number: [id number] Patient Account Number: [id number] Date of Birth/Sex: Treating RN: 25-Nov-1960 (62 y.o. 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 02/19/2023 No Yes foot 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, stage 3 10/07/2022 No Yes Inactive Problems ICD-10 Code Description Active Date Inactive Date L97.322 Non-pressure chronic ulcer of left ankle with fat layer exposed 12/06/2021 12/06/2021 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 Chad [name] Primary Care Provider: [name] Other Clinician: Referring Provider: Treating Provider/Extender: [name] Weeks in Treatment: 67 Subjective Chief Complaint Information obtained from Patient Left ankle ulcer, Left dorsal foot wound, right heel History of Present Illness (HPI) 10/08/18 on evaluation today patient actually presents to our office for initial evaluation concerning wounds that he has of the bilateral lower extremities. 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 He also has bilateral venous stasis. He apparently has been homeless currently following discharge from the hospital apparently he has been placed at [facility] which is is a skilled nursing facility locally. Nonetheless fortunately he does not show any signs of infection at this time which is good news. In fact several of the wound actually appears to be showing some  signs of improvement already in my pinion. There are a couple areas in the left leg in particular there likely gonna require some sharp debridement to help clear away some necrotic tissue and help with more sufficient healing. No fevers, chills, nausea, or vomiting noted at this time. 10/15/18 on evaluation today patient actually appears to be doing very well in regard to his bilateral lower extremities. He's been tolerating the dressing changes without complication. Fortunately there does not appear to be any evidence of active infection at this time which is great news. Overall I'm actually very pleased with how this has progressed in just one visits time. Readmission: 08/14/2020 upon  evaluation today patient presents for re-evaluation here in our clinic. He is having issues with his left ankle region as well as his right toe and his right heel. He tells me that the toe and heel actually began as a area that was itching that he was scratching and then subsequently opened up into wounds. These may have been abscess areas I presume based on what I am seeing currently. With regard to his left ankle region he tells me this was a similar type occurrence although he does have venous stasis this very well may be more of a venous leg ulcer more than anything. Nonetheless I do believe that the patient would benefit from appropriate and aggressive wound care to try to help get things under better control here. He does have history of a stroke on the left side affecting him to some degree there that he is able to stand although he does have some residual weakness. Otherwise again the patient does have chronic venous insufficiency as previously noted. His arterial studies most recently obtained showed that he had an ABI on the right of 1.16 with a TBI of 0.52 and on the left and ABI of 1.14 with a TBI of 0.81. That was obtained on 06/19/2020. 08/28/2020 upon evaluation today patient appears to be doing decently well in regard to his wounds in general. He has been tolerating the dressing changes without complication. Fortunately there does not appear to be any signs of active infection which is great news. With that being said I think the [facility] is doing a good job I would recommend that we likely continue with that currently. 09/11/2020 upon evaluation today patient's wounds did not appear to be doing too poorly but again he is not really showing signs of significant improvement with regard to any of the wounds on the right. None of them have Hydrofera Blue on them I am not exactly sure why this is not being followed as the facility did not contact us to let us know of any issues with  obtaining dressings or otherwise. With that being said he is supposed to be using Hydrofera Blue on both of the wounds on the right foot as well as the ankle wound on the left side. 09/18/2020 upon evaluation today patient appears to be doing poorly with regard to his wounds. Again right now the left ankle in particular showed signs of extreme maceration. Apparently he was told by someone with staff at [facility] they could not get the [facility]. With that being said this is something that is never been relayed to [place] one way or another. Also the patient subsequently has not supposed to have a border gauze dressing on. He should have an ABD pad and roll gauze to secure as this drains much too much just to have a border gauze  dressing to cover. Nonetheless the fact that they are not using the appropriate dressing is directly causing deterioration of the left ankle wound it is significantly worse today compared to what it was previous. I did attempt to call [facility] while the patient was here I called three times and got no one to even pick up the phone. After this I had my for an office coordinator call and she was able to finally get through and leave a message with the D ON as of dictation of this note which is roughly about an hour and a half later I still have not been able to speak with anyone at the facility. 09/25/2020 upon evaluation today patient actually showing signs of good improvement which is excellent news. He has been tolerating the dressing changes without complication. Fortunately there is no signs of active infection which is great news. No fevers, chills, nausea, vomiting, or diarrhea. I do feel like the facility has been doing a much better job at taking care of him as far as the dressings are concerned. However the director of nursing never did call me back. 10/09/2020 upon evaluation today patient appears to be doing well with regard to his wound. The toe ulcer  did require some debridement but the other 2 areas actually appear to be doing quite well. 10/19/2020 upon evaluation today patient actually appears to be doing very well in regard to his wounds. In fact the heel does appear to be completely healed. The toe is doing better in the medial ankle on the left is also doing better. Overall I think he is headed in the right direction. 10/26/2020 upon evaluation today patient appears to be doing well with regard to his wound. He is showing signs of improvement which is great news and overall I am very pleased with where things stand today. No fevers, chills, nausea, vomiting, or diarrhea. 11/02/2020 upon evaluation today patient appears to be doing well with regard to his wounds. He has been tolerating the dressing changes without complication overall I am extremely pleased with where things stand today. He in regard to the toe is almost completely healed and the medial ankle on the left is doing much better. KHAREE, HONOR ([id number]) [id number] Page 11 of 16 11/09/2020 upon evaluation today patient appears to be doing a little poorly in regard to his left medial ankle ulcer. Fortunately there does not appear to be any signs of systemic infection but unfortunately locally he does appear to be infected in fact he has blue-green drainage consistent with Pseudomonas. 11/16/2020 upon evaluation today patient appears to be doing well with regard to his wound. It actually appears to be doing better. I did place him on gentamicin cream since the Cipro was actually resistant even though he was positive for Pseudomonas on culture. Overall I think that he does seem to be doing better though I am unsure whether or not they have actually been putting the cream on. The patient is not sure that we did talk to the nurse directly and she was going to initiate that treatment. Fortunately there does not appear to be any signs of active infection at  this time. No fevers, chills, nausea, vomiting, or diarrhea. 4/28; the area on the right second toe is close to healed. Left medial ankle required debridement 12/07/2020 upon evaluation today patient appears to be doing well with regard to his wounds. In fact the right second toe appears to be completely healed which is great news. Fortunately there  does not appear to be any signs of active infection at this time which is also great news. I think we can probably discontinue the gentamicin on top of everything else. 12/14/2020 upon evaluation today patient appears to be doing well with regard to his wound. He is making good progress and overall very pleased with where things stand today. There is no signs of active infection at this time which is great news. 12/28/2020 upon evaluation today patient appears to be doing well with regard to his wounds. He has been tolerating the dressing changes without complication. Fortunately there is no signs of active infection at this time. No fevers, chills, nausea, vomiting, or diarrhea. 12/28/2020 upon evaluation today patient's wound bed actually showed signs of excellent improvement. He has great epithelization and granulation I do not see any signs of infection overall I am extremely pleased with where things stand at this point. No fevers, chills, nausea, vomiting, or diarrhea. 01/11/2021 upon evaluation today patient appears to be doing well with regard to his wound on his leg. He has been tolerating the dressing changes without complication. Fortunately there does not appear to be any signs of active infection which is great news. No fevers, chills, nausea, vomiting, or diarrhea. 01/25/2021 upon evaluation today patient appears to be doing well with regard to his wound. He has been tolerating the dressing changes without complication. Fortunately the collagen seems to be doing a great job which is excellent news. No fevers, chills, nausea, vomiting, or  diarrhea. 02/08/2021 upon evaluation today patient's wound is actually looking a little bit worse especially in the periwound compared to previous. Fortunately there does not appear to be any signs of infection which is great news with that being said he does have some irritation around the periphery of the wound which has me more concerned. He actually had a dressing on that had not been changed in 3 days. He also is supposed to have daily dressing changes. With regard to the dressing applied he had a silver alginate dressing and silver collagen is what is recommended and ordered. He also had no Desitin around the edges of the wound in the periwound region although that is on the order inspect to be done as well. In general I was very concerned I did contact [facility] actually spoke with Verlon Au who is the scheduling individual and subsequently she stated that she would pass the information to the D ON apparently the D ON was not available to talk to me when I call today. 02/18/2021 upon evaluation today patient's wound is actually showing signs of improvement. Fortunately there does not appear to be any evidence of infection which is great news overall I am extremely pleased with where things stand today. No fevers, chills, nausea, vomiting, or diarrhea. 8/3; patient presents for 1 week follow-up. He has no issues or complaints today. He denies signs of infection. 03/11/2021 upon evaluation today patient appears to be doing well with regard to his wound. He does have a little bit of slough noted on the surface of the wound but fortunately there does not appear to be any signs of active infection at this time. No fevers, chills, nausea, vomiting, or diarrhea. 03/18/2021 upon evaluation today patient appears to be doing well with regard to his wound. He has been tolerating the dressing changes without complication. There was a little irritation more proximal to where the wound was that was not  noted last week but nonetheless this is very superficial just seems  to be more irritation we just need to make sure to put a good amount of the zinc over the area in my opinion. Otherwise he does not seem to be doing significantly worse at all which is great news. 03/25/2021 upon evaluation today patient appears to be doing well with regard to his wound. He is going require some sharp debridement today to clear with some of the necrotic debris. I did perform this today without complication postdebridement wound bed appears to be doing much better this is great news. 04/08/2021 upon evaluation today patient appears to be doing decently well in regard to his wound although the overall measurement is not significantly smaller compared to previous. It is gone down a little bit but still the facility continues to not really put the appropriate dressings in place in fact he was supposed to have collagen we think he probably had more of an allergy to At this point. Fortunately there does not appear to be any signs of active infection systemically though locally I do not see anything on initial visualization either as far as erythema or warmth. 04/15/2021 upon evaluation today patient appears to be doing well with regard to his wound. He is actually showing signs of improvement. I did place him on antibiotics last week, Cipro. He has been taking that 2 times a day and seems to be tolerating it very well. I do not see any evidence of worsening and in fact the overall appearance of the wound is smaller today which is also great news. 9/26; left medial ankle chronic venous insufficiency wound is improved. Using Hydrofera Blue 10/10; left medial ankle chronic venous insufficiency. Wound has not changed much in appearance completely nonviable surface. Apparently there have been problems getting the right product on the wound at the facility although he came in with [facility] on today 05/14/2021 upon evaluation  today patient appears to be doing well with regard to his wound. I think he is making progress here which is good news. Fortunately there does not appear to be any signs of active infection at this time. No fevers, chills, nausea, vomiting, or diarrhea. 05/20/2021 upon evaluation today patient appears to be doing well with regard to his wound. He is showing signs of good improvement which is great news. There does not appear to be any evidence of active infection which is also excellent news. No fevers, chills, nausea, vomiting, or diarrhea. 05/28/2021 upon evaluation today patient appears to be doing quite well. There does not appear to be any signs of active infection at this time which is great news. Overall I am extremely pleased with where things stand today. I think he is headed in the right direction. 06/11/2021 upon evaluation today patient appears to be doing well with regard to his left ankle ulcer and poorly in regard to the toe ulcer on the second toe right foot. This appears to show signs of joint exposure. Apparently this has been present for 1 to 2 months although he kept forgetting to tell me about it. That is unfortunate as right now it definitely appears to be doing significantly worse than what I would like to see. There does not appear to be any signs of active infection systemically though locally I am concerned about the possibility of infection the toe is quite red. Again no one from the facility ever contacted [place] to advise that this was going on in the interim either. 06/17/2021 upon evaluation today patient presents for follow-up I did review  his x-ray which showed a navicular bone fracture I am unsure of the chronicity of this. Subsequently he also had osteomyelitis of the toe which was what I was more concerned about this did not show up on x-ray but did show up on the pathology scrapings. This was listed as acute osteomyelitis. Nonetheless at this point I think that the  antibiotic treatment is the best regimen to go with currently. The patient is in agreement with that plan. Nonetheless he has initially 30 days of doxycycline off likely extend that towards the end of the treatment cycle that will be around the middle of December for an additional 2 weeks. That all depends on how well he continues to heal. Nonetheless based on what I am seeing in the foot I did want a proceed with an MRI as well which I think will be helpful to identify if there is anything else that needs to be addressed from the standpoint of infection. 06/24/2021 upon evaluation today patient appears to be doing pretty well in regards to his wounds. I think both are actually showing signs of improvement which is good I did review his MRI today which did show signs of osteomyelitis of the middle and proximal phalanx on his right foot of the affected toe. With that being said this is actually showing signs of significant improvement today already with the antibiotic therapy I think the redness is also improved. Overall I [name], Chad ([id number]) [id number] Page 12 of 16 think that we just need to give this some time with appropriate wound care we will see how things go potentially hyperbarics could be considered. 07/02/2021 upon inspection today patient actually appears to be doing well in regard to his left ankle which is getting very close to complete resolution of pleased in that regard. Unfortunately he is continuing to have issues with his second toe right foot and this seems to still be very painful for him. Recommend he try something different from the standpoint of antibiotics. 07/15/2021 upon evaluation today patient appears to be doing actually pretty well in regard to his foot. This is actually showing signs of significant improvement which is great news. Overall I feel like the patient is improving both in regard to the second toe as well as the ankle on the  left. With that being said the biggest issue that I do see currently is that he is needing to have a refill of the doxycycline that we previously treated him with. He also did see podiatry they are not going to recommend any amputation at this point since he seems to be doing quite well. For that reason we just need to keep things under control from an infection standpoint. 08/01/2021 upon evaluation today patient appears to be doing well with regard to his wound. He has been tolerating the dressing changes without complication. Fortunately there does not appear to be any evidence of active infection locally nor systemically at this point. In fact I think everything is doing excellent in fact his second toe on the right foot is almost healed and the ankle on the left ankle region is actually very close to being healed as well. 08/08/2021 upon evaluation today patient appears to be doing well with regard to his wound. He has been tolerating the dressing changes without complication. Fortunately I do not see any signs of active infection at this time. Readmission: 12-06-2021 upon evaluation today patient presents for reevaluation here in the clinic he does tell me that he was  being seen in facility at [facility] by a provider that was coming in. He is not sure who this was. He tells me however that the wound seems to have gotten worse even compared to where it was when we last saw him at this point. With that being said I do believe that he is likely going need ongoing wound care here in the clinic and I do believe that we need to be the ones to frontline this since his wound does seem to be getting worse not better at this point. He voiced understanding. He is also in agreement with this plan and feels more comfortable coming here she tells me. Patient's medical history really has not changed since his prior admission he was only gone since January. 12-27-2021 upon evaluation today patient appears  to be doing well with regard to his wound they did run out of the [facility] so they did not put anything on just an ABD pad with gentamicin. Still we are seeing some signs of good improvement here with some new epithelization which is great news. 01-10-2022 upon evaluation today patient appears to be doing well with regard to his wounds and he is going require some sharp debridement but overall seems to be making good progress. Fortunately I do not see any evidence of active infection locally or systemically at this time which is great news. 01-24-2022 upon evaluation today patient appears to be doing well with regard to his wound. The facility actually came and dropped him off early and he had another appointment at the hospital and then they just brought him over here and this was still hours before his appointment this afternoon. For that reason we did do our best to work him in this morning and fortunately had some space to make this happen. With that being said patient's wound does seem to be making progress here and I am very pleased in that regard I do not see any signs of active infection locally or systemically at this time. 02-07-2022 patient appears to be doing well currently in regard to his wounds. In fact one of them the more proximal is healed the distal is still open but seems to be doing excellent. Fortunately I do not see any evidence of active infection locally or systemically at this time which is great news. No fevers, chills, nausea, vomiting, or diarrhea. 7/27; left medial ankle venous. Improving per our intake nurse. We are using Hydrofera Blue under Tubigrip compression. They are changing that at his facility 03-06-2022 upon evaluation today patient appears to be doing well with regard to the his wound he is can require some sharp debridement but seems to be making excellent progress. Fortunately I do not see any evidence of active infection locally or systemically which is  great news. 03-27-2022 upon evaluation today patient's wound is actually showing signs of excellent improvement. Fortunately I see no signs of active infection locally or systemically at this time which is great news. No fevers, chills, nausea, vomiting, or diarrhea. 04-21-2022 upon evaluation today patient appears to be doing excellent in regard to his wound in fact this is very close to resolution based on what I am seeing. I do not see any evidence of active infection locally or systemically at this time which is great news and overall I am extremely pleased with where we are today. 05-05-2022 upon evaluation today patient's wound actually appears to potentially be completely healed. Fortunately I do not see any evidence of active  infection at this time which is great news and overall I am very pleased I think this needs a little time to toughen up but other than that I really do believe were doing quite well. He is very pleased to hear this its been a long time coming. 05-19-2022 upon evaluation today patient appears to be doing well currently in regard to his wound. He in fact we were hoping will be completely healed and closed but it still has a very tiny area right in the center which is still continuing to drain. Fortunately I do not see any signs of infection locally or systemically which is great news. 11/6; this wound is close to completely" he comes in this week with some erythema at 1 edge of this which looks like something was rubbing on the area. Other than that no evidence of infection 06-16-2022 upon evaluation today patient appears to be doing well currently in regard to his ankle ulcer. This is very close to being healed but still has a small area in the central portion which is not. Fortunately there does not appear to be any signs of infection locally or systemically which is great news. No fevers, chills, nausea, vomiting, or diarrhea. 06-30-2022 upon evaluation patient's wound  pretty much appears to be almost completely closed. In fact it may even be closed but there are still a lot of skin irritation around the edges of the wound and I just do not feel great about releasing them yet I would like to continue to monitor this just a little bit longer before getting to that point. He is not opposed to this and in fact is happy to continue to come in if need be in order to make sure that things are moving in the right direction he definitely does not want to backtrack. 07-14-2022 upon evaluation today patient appears to be doing well currently in regard to his wound. Has been tolerating the dressing changes without complication. Fortunately I see no evidence of active infection locally nor systemically which is great news. No fevers, chills, nausea, vomiting, or diarrhea. 08-11-2022 upon evaluation today patient appears to be doing a little worse than last time I saw him although it has been a month. Unfortunately he was not able to be seen due to the fact that he was quarantined in the facility with COVID. Subsequently he tells me that he mention to the facility staff several times about taking it off over the past several weeks but they continue to tell him that someone have to get in touch with [place] here although nobody ever did until Friday when the nurse manager called to have that documented in the communication notes. With that being said unfortunately this means that he had a wrap on that he was supposed to have on for only 1 week for ended up being on four 1 month essentially. I am glad that there is nothing worse than what we see currently to be perfectly honest. 08-19-2022 upon evaluation today patient appears to be doing well currently in regard to his wounds. He has been tolerating the dressing changes without complication. This is actually smaller today which is great news after last week's reopening. Fortunately I do not see any evidence of infection locally  nor systemically at this point. 08-26-2022 upon evaluation today patient appears to be doing well currently in regard to his wound. He has been tolerating the dressing changes without complication. Fortunately there does not appear to be any signs of  active infection locally nor systemically which is great news. No fevers, chills, nausea, Chad, [name] ([id number]) [id number] Page 13 of 16 vomiting, or diarrhea. 09-02-2022 upon evaluation today patient's wound actually showing signs of excellent improvement this is measuring smaller and looking much better. I am very pleased with where we stand I do believe that we are headed in the right direction. 09-09-2022 upon evaluation today patient appears to be doing decently well in regard to his leg in general although there was some swelling this is the 1 thing that I am not too happy with based on what I see currently. Fortunately there does not appear to be any signs of infection locally nor systemically at this point. 09-16-2022 upon evaluation today patient appears to be doing well currently in regard to his wound. He is actually showing signs of improvement he wore the Tubigrip this week and it significantly better compared to last week's evaluation. Fortunately I do not see any evidence of active infection locally or systemically which is great news. Overall I think that were headed in the right direction which is great news. In fact overall I think this is the best that he has looked in some time. He does not like the Tubigrip but actually did extremely well with this in my opinion. I think he needs to continue to utilize this. 09-23-2022 upon evaluation today patient appears to be doing well currently in regard to his wound. He has been tolerating the dressing changes without complication. Fortunately there does not appear to be any signs of active infection locally nor systemically which is great news and in general I do  feel like that we are headed in the right direction. He has been having trouble with the Tubigrip however we have gotten the facility to order compression socks he has not gotten them as of yet. 3/4; patient is about the same with a wound on the left posterior leg. This is a venous wound. He needs more compression on this leg but he took off the previous wraps we are using. I am not certain whether he is using juxta lite stockings at the nursing home. 3/12; patient presents for follow-up. He unfortunately has 2 new wounds 1 to the dorsal left side and the other to the right heel. He is not sure how these happened. The right heel appears to be caused by pressure. He has been using Hydrofera Blue to the original wound on the left leg. 10-14-2022 upon evaluation today patient appears to be doing well currently in regard to his original wound but unfortunately he has several new wounds which I was completely unaware of before walking into the room to see him today. Unfortunately he seems to not be doing nearly as well as what I saw when I last had an appointment with him 3 weeks ago. Fortunately I do not see any signs of systemic infection though unfortunately locally he has definite infection of the left lower extremity. He is currently on doxycycline I am going to likely have to extend that today. 10-21-2022 upon evaluation today patient appears to be doing well currently in regard to his wounds. He seems to be tolerating the dressing changes without complication. Fortunately there does not appear to be any signs of active infection locally or systemically which is great news and in general I definitely feel like we are on the right track here. 10-30-2022 upon evaluation today patient still continues to not be doing too well at all with regard to  his wounds. Unfortunately he is still having signs of infection as well which also has been concerned. Fortunately I do not see any signs of active infection  locally nor systemically at this time. No fevers, chills, nausea, vomiting, or diarrhea. 11-06-2022 upon evaluation today patient appears to be doing okay currently in regard to his wounds. All things considered with his blood flow not being nearly as good as what should I think that he is making pretty good progress here. Fortunately I do not see any signs of infection locally nor systemically which is great news. No fevers, chills, nausea, vomiting, or diarrhea. 11-13-2022 upon evaluation today patient appears to be doing poorly currently in regard to his wounds. Things seem to be getting a little bit worse not better. 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 Subsequently he is also going to need to have vascular intervention but we are unsure exactly when this is going on the undertaking. I would try to actually send a message to Dr. Hazle Quant and the vascular team just to see what exactly the plan is going forward. The patient is in agreement with that plan I just think we need to have a plan especially in light of the fact the left leg seems to be getting a little bit worse at this point. 11-25-2022 upon evaluation today patient is currently now discharged from the hospital following his colostomy procedure. That did very well and he seems to be doing well. Fortunately there does not appear to be any signs of active infection locally or systemically which is great news. No fevers, chills, nausea, vomiting, or diarrhea. 12-04-2022 upon evaluation today patient appears to be doing actually decently well in regard to his wounds and actually very pleased with where we stand and I think that he is making some pretty good progress here. Fortunately I do not see any signs of active infection locally nor systemically which is great news. No fevers, chills, nausea, vomiting, or diarrhea. 12-12-2022 upon evaluation  today patient appears to be doing well currently in regard to his wound. He has been tolerating the dressing changes without complication. The wounds all seem to be making some progress to a degree. He has appointment with vascular on the 23rd of this month. 5/24; the patient was scheduled for a left lower extremity angiogram yesterday but apparently they were overbooked and they wanted to admit him to hospital last night so they could do this early this morning but he refused I see that Dr. Wyn Quaker has rescheduled him for June 6. He is using silver alginate to wounds on his bilateral lower extremities. 01-02-2023 upon evaluation today patient appears to be doing poorly in regard to his wounds still. Subsequently he did have his angiogram but apparently there really was not much that was found that could be revascularized. The patient tells me that Dr. Wyn Quaker was going to contact me here in the office I have not heard from him as of yet and I may see about giving him a call just to follow-up or possibly send a message through epic which could be an option as well. 01-09-2023 upon evaluation today patient appears to be doing poorly in regard to his legs he is extremely swollen. He has been sleeping in his chair he tells me for the past week. His bed is broken and they have ordered a new one which is  supposedly supposed to be there today. With that being said he the way he needs to be not sleeping in his chair that is his wheelchair every day and through the night. This is causing his legs to be much more swollen and it seen and how badly wounds are doing at this point. This has made things significantly worse. Fortunately I do not see any signs of infection right now unfortunately I do believe that the patient has a tremendous amount of drainage currently. 01-19-2023 upon evaluation today patient appears to be doing poorly in regard to his wounds of his legs his swelling is better but still not as good as what  we would like to see. Fortunately I do not see any evidence of active infection locally nor systemically which is great news and in general I do believe that we are moving in the right direction here. Nonetheless it would be better if she was in the bed sleeping or not send up in his wheelchair although he seems to still be set up in the wheelchair at this time. He tells me that he is just not as comfortable in the bed as the wheelchair but unfortunately this is causing damage to his legs which I discussed with him today as well. Nonetheless I am not sure that he is going to be amendable to doing anything different. 01-27-2023 upon evaluation today patient appears to be doing well currently. Fortunately I do not see any evidence of active infection locally nor systemically which is great news I think he is actually doing the best option and while in fact I would try to see if we can do some debridement today to get things moving along. 02-03-2023 upon evaluation today patient's wounds unfortunately are not doing nearly as well. Will be doing the [employer] wraps unfortunately we just not seeing the improvement that I would like to see. Fortunately I do not see any evidence of infection at this time which is good news unfortunately I do think that he needs to be changed on a daily basis due to the amount of drainage otherwise I do not think this is really going to continue to improve dramatically. 02-12-2023 upon evaluation today patient appears to be doing better in a lot of ways in regard to his wounds and although his toes on the left side actually seem a bit worse. Fortunately I do not see any evidence of active infection locally nor systemically at this time which is good news. With that being said he is having some bleeding from the left leg I think that this is an area that looks like it probably got bumped. With that being said I discussed this with the patient ZAM, ENT ([id number])  [id number] Page 14 of 16 he tells me he does not know of any trauma that occurred but at the same time if was not this that it was skin that got pulled off. Dressing change there is only one of the 2 that could have happened. 7/25; this is a patient who is very frail at [facility] nursing facility. He is nonambulatory. He has wounds on his bilateral lower legs. However much worse on the left we have been using silver cell to all of his wounds He comes in today and a lot more pain our intake nurse reports deterioration of the left foot. I ended up looking through some of his record. 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 I am really not certain of the stage of this currently or who is following 02-26-2023 upon evaluation today patient unfortunately appears to be doing significantly worse compared to where he was previous. The left foot is showing signs of dramatic worsening in regard to the overall appearance his toes are starting to become necrotic almost in full and he has much more cyanosis present than what I even seen previous. He is already seeing Dr. Wyn Quaker and unfortunately there was not anything that they saw that was an immediate cause for this but at the same time I feel like this may have changed and even worsened I feel like this could be a vascular event and I believe this is a limb-threatening situation. 03/26/2023; I last saw this patient on 8/1. Wondered about  below-knee amputations versus palliative care. 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 The wound has healed fairly well and he follows up with surgery today. He is back today looking at wounds on the right foot and lower leg. This includes 2 areas on the right heel, right dorsal foot right dorsal first and second toes and a small area on the right anterior lower tibia area. He has a lot of pain in this leg as well. The angiogram he had on 01/01/2023 specific to the right leg now demonstrated a fairly normal common femoral artery, profunda femoris artery and superficial and popliteal arteries without significant disease. 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 No doubt this has a lot to do with the discomfort in the right leg he describes when he is in bed. They have been using Xeroform on the wounds on the right. He has a lot of swelling in this leg with weeping edema 04-09-2023 upon evaluation today patient appears to be doing poorly still in regard to his right leg. 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 Fortunately I do not see any signs of active infection locally or systemically at this time which is great news. No fevers, chills, nausea, vomiting, or diarrhea. With that being said I am concerned still about the fact that he seems to be getting worse especially in regard to the heels I think that he is still sitting up most of the day which I think is still causing some swelling of his leg unfortunately. 04-16-2023 upon evaluation today patient actually appears to be doing little bit more poorly compared to last week this actually does appear to be infected. Last week I really did not think that was the case but this seems like it may indeed be the case. Fortunately I do not see any signs of active infection locally  or systemically at this time. 04-24-2023 upon evaluation today patient appears to be doing well currently in regard to his leg compared to where we were previous I do feel like that the antibiotics have been of benefit. Fortunately I do not see any signs of active infection at  this time which is great news and in general I believe that we are making headway towards healing which is great news as well. With that being said he still has a lot of drainage I really feel like something like Anola Gurney would be better for him than just an ABD pad it would likely the drainage more or even Zetuvit's but I am not sure whether we can get either 1 of these at the facility for him. 05-01-2023 upon evaluation today patient appears to be doing well currently in regard to his wounds which are showing signs of significant improvement is not any way shape or form infected like it was last time I saw him. I think the antibiotics are doing a good job but I think he may need an extension here to ensure that it completely clears. 05-08-2023 upon evaluation today patient appears to be doing well currently in regard to his wounds for the most part. He still has blue-green drainage noted but in general seems to be doing much better compared to where we were previous. Fortunately I do not see any signs of active infection locally or systemically which is great news and in general I do believe that we are making good headway towards complete closure. 05-18-2023 upon evaluation today patient appears to be doing well currently in regard to his wounds I feel like he is definitely making some improvements here. This is a slow improvement but nonetheless improvement. I do believe that he is doing quite well as far as infection is concerned which is also good news. 05-25-2023 upon evaluation today patient actually appears to be doing worse in regard to his legs at this point. Unfortunately he continues to have issues with significant  drainage which is the primary concern here. I do feel like that the wound currently is not doing a whole lot better when he was on the Levaquin I felt like it was improving some but the concern there is QT prolongation I do not want to continue that personally further I think he needs a referral ASAP to infectious disease. I discussed that with him today. He also needs to be having this changed every day and that has not been happening at this point. That is my biggest concern here. 06-09-2023 upon evaluation today patient appears to be doing still poorly in regard to this right leg which is draining profusely. This is unfortunately just continuing to show signs of being very moist and wet and weeping quite significantly. This is despite everything that we are doing including the facility have been placed him on Levaquin by ID which unfortunately just does not seem to be helping to heal this as well as we would hope either. I do have an infectious disease appointment that we sent 10 for him and that is actually scheduled for next week on the 19th. 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 Objective Constitutional Well-nourished and well-hydrated in no acute distress. Vitals Time Taken: 10:20 AM, Height: 69 in, Weight: 150 lbs, BMI: 22.1, Temperature: 97.7 F, Pulse: 93 bpm, Respiratory Rate: 18 breaths/min, Blood Pressure: 95/74 mmHg. Respiratory normal breathing without difficulty. Chad, [name] ([id number]) [id number] Page 15 of 16 Psychiatric this patient is able to make decisions and demonstrates good insight into disease process. Alert and Oriented x 3. pleasant and cooperative. General Notes: Upon evaluation patient unfortunately is continuing to have significant  issues with swelling here. I am concerned about this and think that he probably needs to most  likely still see infectious disease even with the IV antibiotics I do not feel like this is improving as much as I would like to see to be honest. Integumentary (Hair, Skin) Wound #16 status is Open. Original cause of wound was Gradually Appeared. The date acquired was: 09/26/2022. The wound has been in treatment 35 weeks. The wound is located on the Right Calcaneus. The wound measures 9.5cm length x 5cm width x 0.2cm depth; 37.306cm^2 area and 7.461cm^3 volume. There is a medium amount of serosanguineous drainage noted. Wound #18 status is Open. Original cause of wound was Pressure Injury. The date acquired was: 09/26/2022. The wound has been in treatment 34 weeks. The wound is located on the Right,Medial T Second. The wound measures 1.5cm length x 1cm width x 0.1cm depth; 1.178cm^2 area and 0.118cm^3 volume. There oe is a medium amount of serosanguineous drainage noted. Wound #21 status is Open. Original cause of wound was Pressure Injury. The date acquired was: 03/02/2023. The wound has been in treatment 10 weeks. The wound is located on the Right,Dorsal Foot. The wound measures 5cm length x 4.8cm width x 0.2cm depth; 18.85cm^2 area and 3.77cm^3 volume. There is a medium amount of serous drainage noted. Wound #22 status is Open. Original cause of wound was Pressure Injury. The date acquired was: 03/02/2023. The wound has been in treatment 10 weeks. The wound is located on the Right,Anterior Lower Leg. The wound measures 11cm length x 14.5cm width x 0.1cm depth; 125.271cm^2 area and 12.527cm^3 volume. There is a medium amount of serous drainage noted. Wound #23 status is Open. Original cause of wound was Pressure Injury. The date acquired was: 05/02/2023. The wound has been in treatment 4 weeks. The wound is located on the Right,Posterior Lower Leg. The wound measures 7cm length x 6.5cm width x 0.1cm depth; 35.736cm^2 area and 3.574cm^3 volume. There is a medium amount of serosanguineous drainage  noted. 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 I would recommend that we have the patient going continue to monitor for any signs of infection or worsening. Based on what I am seeing I do believe there were making good headway here towards closure. 2. I would recommend as well the patient should continue to utilize the IV antibiotics as prescribed by the facility. Obviously I think this is helping to some degree. 3. I am also can recommend that he should have regular dressing changes I think daily is necessary at this point. He needs to make sure to let them do it even on the weekends even if they do not do it exactly right having it changed out with fresh dressings even in that situation is better than not having them changed out at all. We will see patient back for reevaluation in 1 week here in the clinic. If anything worsens or changes patient will contact our office for additional recommendations. Electronic Signature(s) Signed: 06/09/2023 11:14:05 AM By: [name] PA-C Entered By: [name] on 06/09/2023 11:14:05 SuperBill Details -------------------------------------------------------------------------------- [name] ([id number]) [id number] Page 16 of 16 Patient Name: Date of Service: [facility],  RO BERT 06/09/2023 Medical Record Number: [id number] Patient Account Number: [id number] Date of Birth/Sex: Treating RN: September 10, 1960 (62 y.o. Chad [name] Primary Care Provider: [name] Other Clinician: Referring Provider: Treating Provider/Extender: [name] Weeks in Treatment: 78 Diagnosis Coding ICD-10 Codes Code Description [phone] Chronic venous hypertension (idiopathic) with ulcer and inflammation of left lower extremity 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, stage 3 Facility Procedures CPT4 Code Description Modifier Quantity [phone] - WOUND CARE VISIT-LEV 5 EST PT 1 Physician Procedures Quantity CPT4 Code Description Modifier [id number] - WC PHYS LEVEL 3 - EST PT 1 ICD-10 Diagnosis Description 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 (idiopathic) with ulcer and inflammation of left lower extremity 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 Electronic Signature(s) Signed: 06/09/2023 4:52:48 PM By: Midge Aver MSN RN CNS WTA Signed: 06/11/2023 7:57:50 AM By: [name] PA-C Previous Signature: 06/09/2023 11:15:06 AM Version By: [name] PA-C Entered By: Midge Aver on 06/09/2023 11:22:38

## 2023-06-11 ENCOUNTER — Encounter (INDEPENDENT_AMBULATORY_CARE_PROVIDER_SITE_OTHER): Payer: Self-pay | Admitting: Nurse Practitioner

## 2023-06-11 ENCOUNTER — Ambulatory Visit (INDEPENDENT_AMBULATORY_CARE_PROVIDER_SITE_OTHER): Payer: Medicaid Other | Admitting: Nurse Practitioner

## 2023-06-11 VITALS — BP 107/71 | HR 82 | Resp 18 | Ht 69.0 in | Wt 155.0 lb

## 2023-06-11 DIAGNOSIS — Z9889 Other specified postprocedural states: Secondary | ICD-10-CM | POA: Diagnosis not present

## 2023-06-11 DIAGNOSIS — Z89512 Acquired absence of left leg below knee: Secondary | ICD-10-CM | POA: Diagnosis not present

## 2023-06-11 DIAGNOSIS — I739 Peripheral vascular disease, unspecified: Secondary | ICD-10-CM | POA: Diagnosis not present

## 2023-06-16 ENCOUNTER — Ambulatory Visit (INDEPENDENT_AMBULATORY_CARE_PROVIDER_SITE_OTHER): Payer: Medicaid Other | Admitting: Infectious Diseases

## 2023-06-16 ENCOUNTER — Other Ambulatory Visit: Payer: Self-pay

## 2023-06-16 ENCOUNTER — Encounter: Payer: Self-pay | Admitting: Infectious Diseases

## 2023-06-16 VITALS — BP 119/72 | HR 88 | Temp 98.8°F

## 2023-06-16 DIAGNOSIS — B182 Chronic viral hepatitis C: Secondary | ICD-10-CM | POA: Diagnosis present

## 2023-06-16 DIAGNOSIS — L97519 Non-pressure chronic ulcer of other part of right foot with unspecified severity: Secondary | ICD-10-CM | POA: Diagnosis not present

## 2023-06-16 DIAGNOSIS — E46 Unspecified protein-calorie malnutrition: Secondary | ICD-10-CM | POA: Insufficient documentation

## 2023-06-16 DIAGNOSIS — Z79899 Other long term (current) drug therapy: Secondary | ICD-10-CM | POA: Insufficient documentation

## 2023-06-16 DIAGNOSIS — I739 Peripheral vascular disease, unspecified: Secondary | ICD-10-CM

## 2023-06-16 DIAGNOSIS — L97919 Non-pressure chronic ulcer of unspecified part of right lower leg with unspecified severity: Secondary | ICD-10-CM

## 2023-06-16 DIAGNOSIS — I872 Venous insufficiency (chronic) (peripheral): Secondary | ICD-10-CM

## 2023-06-16 DIAGNOSIS — T148XXA Other injury of unspecified body region, initial encounter: Secondary | ICD-10-CM | POA: Insufficient documentation

## 2023-06-16 DIAGNOSIS — L89610 Pressure ulcer of right heel, unstageable: Secondary | ICD-10-CM | POA: Diagnosis not present

## 2023-06-16 NOTE — Progress Notes (Unsigned)
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   Genetic testing 06/15/2019   Swelling of limb 04/15/2019   History of colon cancer 03/24/2019   Family history of prostate cancer    Family history of colon cancer    Family history of leukemia    Family history of kidney cancer    Goals of care, counseling/discussion 12/25/2018   Chronic pain syndrome 11/18/2018   Peripheral vascular disease of lower extremity with ulceration (HCC) 10/04/2018   Neoplasm related pain 09/08/2018   Poor appetite 09/08/2018   Weakness generalized 07/02/2018   Anorexia 07/02/2018   Chronic pain due to neoplasm 07/02/2018   Iron deficiency anemia due to chronic blood loss 05/27/2018   Ureteral cancer, 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    AMINO ACIDS-PROTEIN HYDROLYS (PRO-STAT) LIQD    Take 45 mLs by mouth daily.   ASCORBIC ACID (VITAMIN C) 500 MG TABLET    Take 1 tablet (500 mg total) by mouth 2 (two) times daily.   ASPIRIN EC 81 MG TABLET    Take 1 tablet (81 mg total) by mouth daily.   ATORVASTATIN (LIPITOR) 10 MG TABLET    Take 1 tablet (10 mg total) by mouth daily.   CALCIUM CARB-CHOLECALCIFEROL (CALCIUM CARBONATE-VITAMIN D3 PO)    Take 2 tablets by mouth daily. 600-400 mg    CARBOXYMETHYLCELLULOSE SODIUM (REFRESH LIQUIGEL) 1 % GEL    1 gtt OS only daily   CLOPIDOGREL (PLAVIX) 75 MG TABLET    Take 75 mg by mouth daily.   CYCLOBENZAPRINE (FEXMID) 7.5 MG TABLET    Take 15 mg by mouth at bedtime.   ESCITALOPRAM (LEXAPRO) 5 MG TABLET    Take 5 mg by mouth daily.   FERROUS FUMARATE (FERRETTS) 325 (106 FE) MG TABS TABLET    Take 1 tablet by mouth.   GABAPENTIN (NEURONTIN) 100 MG CAPSULE    Take 100 mg by mouth 3 (three) times daily.   IBUPROFEN (ADVIL) 400 MG TABLET    Take 1 tablet (400 mg total) by mouth every 6 (six) hours as needed for mild pain or moderate pain.   LEPTOSPERMUM MANUKA HONEY (MEDIHONEY) PSTE PASTE    Apply 1 Application topically daily.   MIRTAZAPINE (REMERON) 7.5 MG TABLET    Take 7.5 mg by mouth at bedtime.   MULTIPLE VITAMIN (MULTIVITAMIN) TABLET    Take 1 tablet by mouth daily.   OXYCODONE 10 MG TABS    Take 1-1.5 tablets (10-15 mg total) by mouth every 4 (four) hours as needed for severe pain or moderate pain.   POLYETHYL GLYCOL-PROPYL GLYCOL (SYSTANE) 0.4-0.3 % SOLN    Apply to eye.   POTASSIUM CHLORIDE SA (KLOR-CON M) 20 MEQ TABLET  Take 2 tablets (40 mEq total) by mouth daily.   SENNA-DOCUSATE (SENOKOT-S) 8.6-50 MG TABLET    Take 1 tablet by mouth daily.   TAMSULOSIN (FLOMAX) 0.4 MG CAPS CAPSULE    Take 1 capsule (0.4 mg total) by mouth daily after supper.   TRAMADOL (ULTRAM) 50 MG TABLET    Take 1 tablet (50 mg total) by mouth every 6 (six) hours as needed.  Modified Medications   No medications on file  Discontinued Medications   No medications on file    Subjective: 62 Y O male with h/o Urothelial ca, colon ca s/p rt hemicolectomy, COPD, HCV, BPH, anxiety/depression, HLD, malnutrition, CVA s/p left sided weakness, PVD, s/p left BKA, chronic venous insufficiency,  DU of rt leg and foot who is referred from wound care for concerns for infection of wounds in the Rt LE. Patient has been following with wound care for rt calcaneus, rt dorsal  foot wound, rt posterior leg, rt anterior leg, rt medial second toe wounds for last several months.   Reports he had chronic wounds in the rt leg and rt foot for several months. He is very frustrated about noone  being unable to do something to heal the wound. Describes yellowish intermittent drainage from the wound but cannot tell me if its foul smelling. Reports completing recently 2 weeks course of PO levofloxacin and he is adamant about not doing another course of abtx as he feels antibiotics have not healed his wounds. He told me " Do I have to keep on suffering from this wounds forever? Is there a way to get rid of these wound ? " However, he also declined seeing Orthopedics Dr Lajoyce Corners to see if he needs a surgical intervention. I attempted to explain him that he was referred to me for evaluation of possible need for IV abtx due to non healing of the wound despite of po abtx. Denies fevers, chills. Denies nausea, vomiting, diarrhea or any symptoms concerning for wound infection   He has been following with wound care, last seen 11/12 when they were concerned for infection being present despite completing course of levofloxacin. He was last seen by Vascular 10/16 for left BKA amputation site where wound was healing except a very small 1cm area of opening, plan to fu in 4 weeks.   Regarding HCV, he says everything is good and he is here for his Rt leg and nothing else. He was not willing to discuss about HCV.   Review of Systems: as above  Past Medical History:  Diagnosis Date   Anemia    Anxiety    ARF (acute respiratory failure) (HCC)    Atherosclerosis of arteries of extremities (HCC)    Benign prostatic hyperplasia with urinary obstruction    Bladder cancer (HCC)    BPH with obstruction/lower urinary tract symptoms    Chronic viral hepatitis C (HCC)    Colon cancer (HCC)    COPD (chronic obstructive pulmonary disease) (HCC)    Depression    Dysphagia    Family history of colon cancer     Family history of kidney cancer    Family history of leukemia    Family history of prostate cancer    Generalized anxiety disorder    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    GERD (gastroesophageal reflux disease)    Hepatitis    chronic hep c   Hydronephrosis    Hydronephrosis with ureteral stricture    Hyperlipidemia    Insomnia, unspecified  Knee pain    Left   Major depressive disorder, recurrent, moderate (HCC)    Malignant neoplasm of colon (HCC)    Malnutrition (HCC)    Muscle weakness (generalized)    Nerve pain    Other abnormalities of gait and mobility    Other chronic pain    Peripheral vascular disease (HCC)    Personal history of transient cerebral ischemia    Pressure ulcer    Prostate cancer (HCC)    Stroke (HCC)    Tinea unguium    Tobacco user    Unspecified protein-calorie malnutrition (HCC)    Ureteral cancer, right (HCC)    Urinary frequency    Venous hypertension of both lower extremities    Xerosis cutis    Past Surgical History:  Procedure Laterality Date   AMPUTATION Left 03/03/2023   Procedure: AMPUTATION BELOW KNEE;  Surgeon: Renford Dills, MD;  Location: [facility];  Service: Vascular;  Laterality: Left;   COLON SURGERY     En bloc extended right hemicolectomy 07/2017   COLONOSCOPY WITH PROPOFOL N/A 11/06/2020   Procedure: COLONOSCOPY WITH PROPOFOL;  Surgeon: Wyline Mood, MD;  Location: [facility] END[facility];  Service: Gastroenterology;  Laterality: N/A;   COLONOSCOPY WITH PROPOFOL N/A 07/31/2021   Procedure: COLONOSCOPY WITH PROPOFOL;  Surgeon: Earline Mayotte, MD;  Location: [facility] ENDOSCOPY;  Service: Endoscopy;  Laterality: N/A;   CYSTOSCOPY W/ RETROGRADES Right 08/30/2018   Procedure: CYSTOSCOPY WITH RETROGRADE PYELOGRAM;  Surgeon: Vanna Scotland, MD;  Location: [facility];  Service: Urology;  Laterality: Right;   CYSTOSCOPY WITH STENT PLACEMENT Right 04/25/2018   Procedure: CYSTOSCOPY WITH STENT PLACEMENT;  Surgeon: Vanna Scotland, MD;  Location: [facility];  Service: Urology;  Laterality: Right;   CYSTOSCOPY WITH STENT PLACEMENT Right 08/30/2018   Procedure: CYSTOSCOPY WITH STENT Exchange;  Surgeon: Vanna Scotland, MD;  Location: [facility];  Service: Urology;  Laterality: Right;   CYSTOSCOPY WITH STENT PLACEMENT Right 03/07/2019   Procedure: CYSTOSCOPY WITH STENT Exchange;  Surgeon: Vanna Scotland, MD;  Location: [facility];  Service: Urology;  Laterality: Right;   CYSTOSCOPY WITH STENT PLACEMENT Right 11/21/2019   Procedure: CYSTOSCOPY WITH STENT Exchange;  Surgeon: Vanna Scotland, MD;  Location: [facility];  Service: Urology;  Laterality: Right;   LOWER EXTREMITY ANGIOGRAPHY Left 05/23/2019   Procedure: LOWER EXTREMITY ANGIOGRAPHY;  Surgeon: Annice Needy, MD;  Location: [facility];  Service: Cardiovascular;  Laterality: Left;   LOWER EXTREMITY ANGIOGRAPHY Right 05/30/2019   Procedure: LOWER EXTREMITY ANGIOGRAPHY;  Surgeon: Annice Needy, MD;  Location: [facility];  Service: Cardiovascular;  Laterality: Right;   LOWER EXTREMITY ANGIOGRAPHY Right 02/13/2020   Procedure: LOWER EXTREMITY ANGIOGRAPHY;  Surgeon: Annice Needy, MD;  Location: [facility];  Service: Cardiovascular;  Laterality: Right;   LOWER EXTREMITY ANGIOGRAPHY Left 02/20/2020   Procedure: LOWER EXTREMITY ANGIOGRAPHY;  Surgeon: Annice Needy, MD;  Location: [facility];  Service: Cardiovascular;  Laterality: Left;   LOWER EXTREMITY ANGIOGRAPHY Left 01/01/2023   Procedure: Lower Extremity Angiography;  Surgeon: Annice Needy, MD;  Location: [facility];  Service: Cardiovascular;  Laterality: Left;   PORTA CATH INSERTION N/A 02/28/2019   Procedure: PORTA CATH INSERTION;  Surgeon: Annice Needy, MD;  Location: [facility];  Service: Cardiovascular;  Laterality: N/A;   ROBOT ASSISTED LAPAROSCOPIC PARTIAL COLECTOMY  11/17/2022   tumor removed       Social History   Tobacco Use   Smoking  status: Every Day     Current packs/day: 0.50    Types: Cigarettes   Smokeless tobacco: Never  Vaping Use   Vaping status: Never Used  Substance Use Topics   Alcohol use: Not Currently   Drug use: Not Currently    Types: Marijuana, Methylphenidate    Comment: quit 1997-1998 ish    Family History  Problem Relation Age of Onset   Prostate cancer Father 48   Cancer Brother 98       unsure type   Cancer Paternal Uncle        unsure type   Cancer Maternal Grandmother        unsure type   Cancer Paternal Grandmother        unsure type   Kidney cancer Paternal Grandfather    Cancer Other        unsure types   Leukemia Son    Cancer Son        other cancers, possibly colon    Allergies  Allergen Reactions   Penicillins Rash    Health Maintenance  Topic Date Due   DTaP/Tdap/Td (1 - Tdap) Never done   Zoster Vaccines- Shingrix (1 of 2) Never done   COVID-19 Vaccine (2 - Mixed Product risk series) 05/24/2021   Colonoscopy  08/01/2031   INFLUENZA VACCINE  Completed   Hepatitis C Screening  Completed   HIV Screening  Completed   HPV VACCINES  Aged Out    Objective: BP 119/72   Pulse 88   Temp 98.8 F (37.1 C) (Temporal)   SpO2 97%   Physical Exam Constitutional:      Appearance: cachectic male sitting in the wheelchair. HEENT wnl  HENT:     Head: Normocephalic and atraumatic.      Mouth: Mucous membranes are moist.  Eyes:    Conjunctiva/sclera: Conjunctivae normal.     Pupils:   Cardiovascular:     Rate and Rhythm: Normal rate and regular rhythm.     Heart sounds: s1s2  Pulmonary:     Effort: Pulmonary effort is normal.     Breath sounds: Normal breath sounds.   Abdominal:     General: Non distended     Palpations: soft. Colostomy with soft stool, no surrounding cellulitis   Musculoskeletal:        General: left BKA, no signs of infection    Multiple DU in the rt leg and rt foot ( difficult exam due to no availability of medication impregnated gauge to reapply after  removal in the clinic and hence, I did not take it out)  Healing scabbed wound in the left hand  Rt dorsal foot   Rt lower leg   Rt heel    Rt medial ankle     Neurological:     General:    Mental Status: awake, alert and oriented to person, place, and time.   Psychiatric:        Mood and Affect: frustrated  Lab Results Lab Results  Component Value Date   WBC 6.3 05/15/2023   HGB 9.5 (L) 05/15/2023   HCT 30.5 (L) 05/15/2023   MCV 84.3 05/15/2023   PLT 195 05/15/2023    Lab Results  Component Value Date   CREATININE 0.75 05/15/2023   BUN 20 05/15/2023   NA 137 05/15/2023   K 3.8 05/15/2023   CL 109 05/15/2023   CO2 23 05/15/2023    Lab Results  Component Value Date   ALT 13 05/15/2023  AST 25 05/15/2023   ALKPHOS 104 05/15/2023   BILITOT 0.4 05/15/2023    Lab Results  Component Value Date   TRIG 56 04/28/2018   No results found for: "LABRPR", "RPRTITER" No results found for: "HIV1RNAQUANT", "HIV1RNAVL", "CD4TABS"    Microbiology Results for orders placed or performed during the hospital encounter of 02/26/23  Blood culture (single)     Status: None   Collection Time: 02/26/23  3:37 PM   Specimen: BLOOD  Result Value Ref Range Status   Specimen Description BLOOD BLOOD RIGHT FOREARM  Final   Special Requests   Final    BOTTLES DRAWN AEROBIC AND ANAEROBIC Blood Culture results may not be optimal due to an inadequate volume of blood received in culture bottles   Culture   Final    NO GROWTH 5 DAYS Performed at [facility], [address] [place], [place] [address]    Report Status 03/03/2023 FINAL  Final  MRSA Next Gen by PCR, Nasal     Status: Abnormal   Collection Time: 02/28/23  4:30 AM   Specimen: Nasal Mucosa; Nasal Swab  Result Value Ref Range Status   MRSA by PCR Next Gen DETECTED (A) NOT DETECTED Final    Comment: RESULT CALLED TO, READ BACK BY AND VERIFIED WITH: SUSANA HERNANDEZ 02/28/2023 AT 0831 SRR (NOTE) The GeneXpert  MRSA Assay (FDA approved for NASAL specimens only), is one component of a comprehensive MRSA colonization surveillance program. It is not intended to diagnose MRSA infection nor to guide or monitor treatment for MRSA infections. Test performance is not FDA approved in patients less than 64 years old. Performed at [facility], [address], [place], [place] [address]          Assessment/Plan # Multiple chronic venous/pressure ulcers in the rt leg, foot, heel, rt second toe - He has grown strep group G, strep viridans, providencia stuartii, MSSA, Group B strep, PsA from the leg wound in the past  - S/p completion of 14 days of levofloxacin started 9/23 for serratia growing in the molecular panel on 9/19 and off abtx currently  - He did complain of yellowish intermittent drainage. I discussed about possibly doing a short course of IV cefepime for 14 days to cover serratia marcescens as well as prior PsA, Strep, MSSA  which he was grown from the leg wound to which he denied as he feels he has taken abtx in the past and they have not helped at all. No symptoms or signs to suggest sepsis.  - continue wound care, optimal nutrition  - continue to fu with Vascular for venous wounds - Fu in 10-14 days  Orders Placed This Encounter  Procedures   DG Tibia/Fibula Right    Order Specific Question:   Reason for Exam (SYMPTOM  OR DIAGNOSIS REQUIRED)    Answer:   WOUND    Order Specific Question:   Preferred imaging location?    Answer:   [facility]   DG Foot 2 Views Right    Order Specific Question:   Reason for Exam (SYMPTOM  OR DIAGNOSIS REQUIRED)    Answer:   WOUND    Order Specific Question:   Preferred imaging location?    Answer:   [facility]   CBC   Basic metabolic panel    Order Specific Question:   Has the patient fasted?    Answer:   No   C-reactive protein   Sedimentation rate   HCV RNA  quant    # Chronic hepatitis C 05/2018 HCV RNA 5,29,000,  genotype 1am, unclear if this has been treated or not. He cannot tell me about prior tx h/o, will check HCV RNA today to see if tx needed   I have personally spent 63 minutes involved in face-to-face and non-face-to-face activities for this patient on the day of the visit. Professional time spent includes the following activities: Preparing to see the patient (review of tests), Obtaining and/or reviewing separately obtained history (admission/discharge record), Performing a medically appropriate examination and/or evaluation , Ordering medications/tests/procedures, referring and communicating with other health care professionals, Documenting clinical information in the EMR, Independently interpreting results (not separately reported), Communicating results to the patient/family/caregiver, Counseling and educating the patient/family/caregiver and Care coordination (not separately reported).   Of note, portions of this note may have been created with voice recognition software. While this note has been edited for accuracy, occasional wrong-word or 'sound-a-like' substitutions may have occurred due to the inherent limitations of voice recognition software.   Victoriano Lain, MD [facility] 06/16/2023, 10:11 AM

## 2023-06-17 ENCOUNTER — Encounter: Payer: Self-pay | Admitting: Internal Medicine

## 2023-06-17 ENCOUNTER — Encounter: Payer: Medicaid Other | Admitting: Physician Assistant

## 2023-06-17 DIAGNOSIS — B182 Chronic viral hepatitis C: Secondary | ICD-10-CM | POA: Insufficient documentation

## 2023-06-17 DIAGNOSIS — I87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 hypertension (idiopathic) with ulcer and inflammation of left lower extremity: Secondary | ICD-10-CM | POA: Diagnosis not present

## 2023-06-17 NOTE — Progress Notes (Addendum)
Chad, [name] ([id number]) [id number] Page 1 of 15 Visit Report for 06/17/2023 Arrival Information Details Patient Name: Date of Service: Chad [name], Chad [name] 06/17/2023 9:30 A M Medical Record Number: [id number] Patient Account Number: [id number] Date of Birth/Sex: Treating RN: 1960/08/20 (62 y.o. [name] Primary Care Mattias Walmsley: [name] Other Clinician: Referring Lilac Hoff: Treating [name]/Extender: [name] in Treatment: 73 Visit Information History Since Last Visit Added or deleted any medications: No Patient Arrived: Wheel Chair Any new allergies or adverse reactions: No Arrival Time: 09:30 Has Dressing in Place as Prescribed: Yes Accompanied By: self Pain Present Now: No Transfer Assistance: EasyPivot Patient Lift Patient Identification Verified: Yes Secondary Verification Process Completed: Yes Patient Requires Transmission-Based No Precautions: Patient Has Alerts: Yes Patient Alerts: Patient on Blood Thinner NOT diabetic aspirin 81mg  Lives [place] [facility] ABI R 0.81 11/05/22 ABI L 0.59 11/05/22 [facility] 03/03/23 Electronic Signature(s) Signed: 06/19/2023 12:46:12 PM By: Midge Aver MSN RN CNS WTA Entered By: Midge Aver on 06/17/2023 09:43:41 -------------------------------------------------------------------------------- Clinic Level of Care Assessment Details Patient Name: Date of Service: Chad, [name] 06/17/2023 9:30 A M Medical Record Number: [id number] Patient Account Number: [id number] Date of Birth/Sex: Treating RN: August 29, 1960 (62 y.o. [name] Primary Care Om Lizotte: [name] Other Clinician: Referring Orlandria Kissner: Treating [name]/Extender: [name] Weeks in Treatment: 79 Clinic Level of Care Assessment Items TOOL 4 Quantity Score X- 1 0 Use when only an EandM is performed on FOLLOW-UP visit ASSESSMENTS - Nursing Assessment / Reassessment [name], [name]  ([id number]) [id number] Page 2 of 15 X- 1 10 Reassessment of Co-morbidities (includes updates in patient status) X- 1 5 Reassessment of Adherence to Treatment Plan ASSESSMENTS - Wound and Skin A ssessment / Reassessment []  - 0 Simple Wound Assessment / Reassessment - one wound X- 5 5 Complex Wound Assessment / Reassessment - multiple wounds []  - 0 Dermatologic / Skin Assessment (not related to wound area) ASSESSMENTS - Focused Assessment []  - 0 Circumferential Edema Measurements - multi extremities []  - 0 Nutritional Assessment / Counseling / Intervention []  - 0 Lower Extremity Assessment (monofilament, tuning fork, pulses) []  - 0 Peripheral Arterial Disease Assessment (using hand held doppler) ASSESSMENTS - Ostomy and/or Continence Assessment and Care []  - 0 Incontinence Assessment and Management []  - 0 Ostomy Care Assessment and Management (repouching, etc.) PROCESS - Coordination of Care []  - 0 Simple Patient / Family Education for ongoing care X- 1 20 Complex (extensive) Patient / Family Education for ongoing care X- 1 10 Staff obtains Chiropractor, Records, T Results / Process Orders est []  - 0 Staff telephones HHA, Nursing Homes / Clarify orders / etc []  - 0 Routine Transfer to another Facility (non-emergent condition) []  - 0 Routine Hospital Admission (non-emergent condition) []  - 0 New Admissions / Manufacturing engineer / Ordering NPWT Apligraf, etc. , []  - 0 Emergency Hospital Admission (emergent condition) []  - 0 Simple Discharge Coordination X- 1 15 Complex (extensive) Discharge Coordination PROCESS - Special Needs []  - 0 Pediatric / Minor Patient Management []  - 0 Isolation Patient Management []  - 0 Hearing / Language / Visual special needs []  - 0 Assessment of Community assistance (transportation, D/C planning, etc.) []  - 0 Additional assistance / Altered mentation []  - 0 Support Surface(s) Assessment (bed, cushion,  seat, etc.) INTERVENTIONS - Wound Cleansing / Measurement []  - 0 Simple Wound Cleansing - one wound X- 5 5 Complex Wound Cleansing - multiple wounds X- 1 5 Wound Imaging (photographs - any number of wounds) []  -  0 Wound Tracing (instead of photographs) []  - 0 Simple Wound Measurement - one wound X- 5 5 Complex Wound Measurement - multiple wounds INTERVENTIONS - Wound Dressings []  - 0 Small Wound Dressing one or multiple wounds X- 5 15 Medium Wound Dressing one or multiple wounds []  - 0 Large Wound Dressing one or multiple wounds []  - 0 Application of Medications - topical []  - 0 Application of Medications - injection [name] ([id number]) [id number] Page 3 of 15 INTERVENTIONS - Miscellaneous []  - 0 External ear exam []  - 0 Specimen Collection (cultures, biopsies, blood, body fluids, etc.) []  - 0 Specimen(s) / Culture(s) sent or taken to Lab for analysis []  - 0 Patient Transfer (multiple staff / Michiel Sites Lift / Similar devices) []  - 0 Simple Staple / Suture removal (25 or less) []  - 0 Complex Staple / Suture removal (26 or more) []  - 0 Hypo / Hyperglycemic Management (close monitor of Blood Glucose) []  - 0 Ankle / Brachial Index (ABI) - do not check if billed separately []  - 0 Vital Signs Has the patient been seen at the hospital within the last three years: Yes Total Score: 215 Level Of Care: New/Established - Level 5 Electronic Signature(s) Signed: 06/19/2023 12:46:12 PM By: Midge Aver MSN RN CNS WTA Entered By: Midge Aver on 06/17/2023 13:58:53 -------------------------------------------------------------------------------- Encounter Discharge Information Details Patient Name: Date of Service: Chad [name] 06/17/2023 9:30 A M Medical Record Number: [id number] Patient Account Number: [id number] Date of Birth/Sex: Treating RN: [birth year] (62 y.o. [name] Primary Care Meher Kucinski: [name] Other  Clinician: Referring Madie Cahn: Treating Lekia Nier/Extender: [name] in Treatment: 1 Encounter Discharge Information Items Discharge Condition: Stable Ambulatory Status: Wheelchair Discharge Destination: Skilled Nursing Facility Telephoned: No Orders Sent: Yes Transportation: Other Accompanied By: self Schedule Follow-up Appointment: Yes Clinical Summary of Care: Electronic Signature(s) Signed: 06/17/2023 2:20:32 PM By: Midge Aver MSN RN CNS WTA Previous Signature: 06/17/2023 2:00:38 PM Version By: Midge Aver MSN RN CNS WTA Entered By: Midge Aver on 06/17/2023 14:20:32 [name] ([id number]) [id number] Page 4 of 15 -------------------------------------------------------------------------------- Lower Extremity Assessment Details Patient Name: Date of Service: JOANNA, DOREY 06/17/2023 9:30 A M Medical Record Number: [id number] Patient Account Number: [id number] Date of Birth/Sex: Treating RN: [birth year] (62 y.o. [name] Primary Care Cincere Deprey: [name] Other Clinician: Referring Roneisha Stern: Treating Gopal Malter/Extender: [name] Weeks in Treatment: 79 Edema Assessment Assessed: [Left: No] [Right: No] [Left: Edema] [Right: :] Calf Left: Right: Point of Measurement: 33 cm From Medial Instep 41 cm Ankle Left: Right: Point of Measurement: 12 cm From Medial Instep 24.5 cm Vascular Assessment Pulses: Dorsalis Pedis Palpable: [Right:No] Extremity colors, hair growth, and conditions: Extremity Color: [Right:Red] Hair Growth on Extremity: [Right:No] Temperature of Extremity: [Right:Warm] Capillary Refill: [Right:> 3 seconds] Dependent Rubor: [Right:No] Blanched when Elevated: [Right:No No] Toe Nail Assessment Left: Right: Thick: Yes Discolored: Yes Deformed: Yes Improper Length and Hygiene: Yes Electronic Signature(s) Signed: 06/19/2023 12:46:12 PM By: Midge Aver MSN RN CNS  WTA Entered By: Midge Aver on 06/17/2023 10:03:15 [name] ([id number]) [id number] Page 5 of 15 -------------------------------------------------------------------------------- Multi Wound Chart Details Patient Name: Date of Service: FLYNT, SCHRECKENGOST 06/17/2023 9:30 A M Medical Record Number: [id number] Patient Account Number: [id number] Date of Birth/Sex: Treating RN: 1960/11/01 (62 y.o. [name] Primary Care Soleia Badolato: [name] Other Clinician: Referring Nikola Blackston: Treating Jenice Leiner/Extender: [name] Weeks in Treatment: 79 Vital Signs Height(in): 69 Pulse(bpm): 90 Weight(lbs): 150 Blood Pressure(mmHg): 111/92 Body Mass Index(BMI):  22.1 Temperature(F): 98.3 Respiratory Rate(breaths/min): 18 [16:Photos:] Right Calcaneus Right, Medial T Second oe Right, Dorsal Foot Wound Location: Gradually Appeared Pressure Injury Pressure Injury Wounding Event: Pressure Ulcer Pressure Ulcer Venous Leg Ulcer Primary Etiology: Anemia, Chronic Obstructive Anemia, Chronic Obstructive Anemia, Chronic Obstructive Comorbid History: Pulmonary Disease (COPD), Coronary Pulmonary Disease (COPD), Coronary Pulmonary Disease (COPD), Coronary Artery Disease, Peripheral Arterial Artery Disease, Peripheral Arterial Artery Disease, Peripheral Arterial Disease, Peripheral Venous Disease, Disease, Peripheral Venous Disease, Disease, Peripheral Venous Disease, Hepatitis C, Osteoarthritis, Hepatitis C, Osteoarthritis, Hepatitis C, Osteoarthritis, Neuropathy, Received Chemotherapy Neuropathy, Received Chemotherapy Neuropathy, Received Chemotherapy 09/26/2022 09/26/2022 03/02/2023 Date Acquired: 36 35 11 Weeks of Treatment: Open Open Open Wound Status: No No No Wound Recurrence: Yes No Yes Clustered Wound: 7x9x0.2 2.5x1.8x0.1 4.5x4.5x0.2 Measurements L x W x D (cm) 49.48 3.534 15.904 A (cm) : rea 9.896 0.353 3.181 Volume (cm) : -5152.70% -542.50%  -800.10% % Reduction in Area: -5163.80% -541.80% -1697.20% % Reduction in Volume: Category/Stage III Category/Stage III Full Thickness Without Exposed Classification: Support Structures Medium Medium Medium Exudate Amount: Serosanguineous Serosanguineous Serous Exudate Type: red, brown red, brown amber Exudate Color: Wound Number: 22 23 N/A Photos: N/A [name] ([id number]) [id number] Page 6 of 15 Right, Anterior Lower Leg Right, Posterior Lower Leg N/A Wound Location: Pressure Injury Pressure Injury N/A Wounding Event: Venous Leg Ulcer Pressure Ulcer N/A Primary Etiology: Anemia, Chronic Obstructive Anemia, Chronic Obstructive N/A Comorbid History: Pulmonary Disease (COPD), Coronary Pulmonary Disease (COPD), Coronary Artery Disease, Peripheral Arterial Artery Disease, Peripheral Arterial Disease, Peripheral Venous Disease, Disease, Peripheral Venous Disease, Hepatitis C, Osteoarthritis, Hepatitis C, Osteoarthritis, Neuropathy, Received Chemotherapy Neuropathy, Received Chemotherapy 03/02/2023 05/02/2023 N/A Date Acquired: 11 5 N/A Weeks of Treatment: Open Open N/A Wound Status: No No N/A Wound Recurrence: Yes Yes N/A Clustered Wound: 14x16.5x0.1 6.5x5x0.1 N/A Measurements L x W x D (cm) 181.427 25.525 N/A A (cm) : rea 18.143 2.553 N/A Volume (cm) : -7600.60% -62.50% N/A % Reduction in Area: -3752.00% -62.50% N/A % Reduction in Volume: Full Thickness Without Exposed Category/Stage III N/A Classification: Support Structures Medium Medium N/A Exudate Amount: Serous Serosanguineous N/A Exudate Type: amber red, brown N/A Exudate Color: Treatment Notes Electronic Signature(s) Signed: 06/17/2023 1:57:19 PM By: Midge Aver MSN RN CNS WTA Previous Signature: 06/17/2023 1:51:47 PM Version By: Midge Aver MSN RN CNS WTA Entered By: Midge Aver on 06/17/2023  13:57:19 -------------------------------------------------------------------------------- Multi-Disciplinary Care Plan Details Patient Name: Date of Service: Chad [name] 06/17/2023 9:30 A M Medical Record Number: [id number] Patient Account Number: [id number] Date of Birth/Sex: Treating RN: September 01, 1960 (62 y.o. [name] Primary Care Aleen Marston: [name] Other Clinician: Referring Lyllian Gause: Treating Jeslynn Hollander/Extender: [name] Weeks in Treatment: 2 Active Inactive Venous Leg Ulcer Nursing Diagnoses: Knowledge deficit related to disease process and management Goals: Patient will maintain optimal edema control Date Initiated: 12/06/2021 Target Resolution Date: 07/26/2023 Goal Status: Active Patient/caregiver will verbalize understanding of disease process and disease management Date Initiated: 12/06/2021 Date Inactivated: 12/19/2022 Target Resolution Date: 11/27/2022 Goal Status: Met Interventions: Assess peripheral edema status every visit. Compression as ordered VINICIO, DEYETTE ([id number]) [id number] Page 7 of 15 Notes: Electronic Signature(s) Signed: 06/17/2023 1:59:13 PM By: Midge Aver MSN RN CNS WTA Entered By: Midge Aver on 06/17/2023 13:59:13 -------------------------------------------------------------------------------- Pain Assessment Details Patient Name: Date of Service: CHLOE, MAGGIORE 06/17/2023 9:30 A M Medical Record Number: [id number] Patient Account Number: [id number] Date of Birth/Sex: Treating RN: 03/10/1961 (62 y.o. [name] Primary Care Kao Berkheimer: [name] Other Clinician: Referring Frisco Cordts: Treating Geonna Lockyer/Extender: Larina Bras,  Anselm Jungling, Arne Cleveland Weeks in Treatment: 79 Active Problems Location of Pain Severity and Description of Pain Patient Has Paino Yes Site Locations Rate the pain. Current Pain Level: 5 Pain Management and Medication Current Pain Management: Electronic  Signature(s) Signed: 06/19/2023 12:46:12 PM By: Midge Aver MSN RN CNS WTA Entered By: Midge Aver on 06/17/2023 09:44:17 [name] ([id number]) [id number] Page 8 of 15 -------------------------------------------------------------------------------- Patient/Caregiver Education Details Patient Name: Date of Service: SOLIMAN, PULEIO 11/20/2024andnbsp9:30 A M Medical Record Number: [id number] Patient Account Number: [id number] Date of Birth/Gender: Treating RN: 1961-06-07 (63 y.o. [name] Primary Care Physician: [name] Other Clinician: Referring Physician: Treating Physician/Extender: [name] in Treatment: 33 Education Assessment Education Provided To: Patient Education Topics Provided Wound/Skin Impairment: Handouts: Caring for Your Ulcer Methods: Explain/Verbal Responses: State content correctly Electronic Signature(s) Signed: 06/19/2023 12:46:12 PM By: Midge Aver MSN RN CNS WTA Entered By: Midge Aver on 06/17/2023 13:59:27 -------------------------------------------------------------------------------- Wound Assessment Details Patient Name: Date of Service: LEOLA, DERAGON 06/17/2023 9:30 A M Medical Record Number: [id number] Patient Account Number: [id number] Date of Birth/Sex: Treating RN: Aug 31, 1960 (62 y.o. [name] Primary Care Adrea Sherpa: [name] Other Clinician: Referring Adom Schoeneck: Treating Marlane Hirschmann/Extender: [name] Weeks in Treatment: 79 Wound Status Wound Number: 16 Primary Pressure Ulcer Etiology: Wound Location: Right Calcaneus Wound Open Wounding Event: Gradually Appeared Status: Date Acquired: 09/26/2022 Comorbid Anemia, Chronic Obstructive Pulmonary Disease (COPD), Coronary Weeks Of Treatment: 36 History: Artery Disease, Peripheral Arterial Disease, Peripheral Venous Clustered Wound: Yes Disease, Hepatitis C, Osteoarthritis, Neuropathy,  Received Chemotherapy Photos Wound Measurements Manwarren, Jayel ([id number]) Length: (cm) 7 Width: (cm) 9 Depth: (cm) 0.2 Area: (cm) 49.48 Volume: (cm) 9.896 [id number] Page 9 of 15 % Reduction in Area: -5152.7% % Reduction in Volume: -5163.8% Wound Description Classification: Exudate Amount: Exudate Type: Exudate Color: Category/Stage III Medium Serosanguineous red, brown Treatment Notes Wound #16 (Calcaneus) Wound Laterality: Right Cleanser Soap and Water Discharge Instruction: Gently cleanse wound with antibacterial soap, rinse and pat dry prior to dressing wounds Wound Cleanser Discharge Instruction: Wash your hands with soap and water. Remove old dressing, discard into plastic bag and place into trash. Cleanse the wound with Wound Cleanser prior to applying a clean dressing using gauze sponges, not tissues or cotton balls. Do not scrub or use excessive force. Pat dry using gauze sponges, not tissue or cotton balls. Peri-Wound Care Topical Primary Dressing Silvercel 4 1/4x 4 1/4 (in/in) Discharge Instruction: Apply Silvercel 4 1/4x 4 1/4 (in/in) as instructed Secondary Dressing Zetuvit Plus 4x8 (in/in) Heel Cup Secured With [employer] Sterile or Non-Sterile 6-ply 4.5x4 (yd/yd) Discharge Instruction: Apply Kerlix as directed Tubigrip Size D, 3x10 (in/yd) Compression Wrap Compression Stockings Add-Ons Electronic Signature(s) Signed: 06/19/2023 12:46:12 PM By: Midge Aver MSN RN CNS WTA Entered By: Midge Aver on 06/17/2023 10:00:41 -------------------------------------------------------------------------------- Wound Assessment Details Patient Name: Date of Service: Chad [name] 06/17/2023 9:30 A M Medical Record Number: [id number] Patient Account Number: [id number] Date of Birth/Sex: Treating RN: 11/01/1960 (62 y.o. [name] Primary Care Khup Sapia: [name] Other Clinician: Referring Myleen Brailsford: Treating  Kaian Fahs/Extender: [name] Weeks in Treatment: 79 Wound Status Wound Number: 18 Primary Pressure Ulcer Etiology: Wound Location: Right, Medial jionni sanders, Martavius ([id number]) [id number] Page 10 of 15 Wound Open Wounding Event: Pressure Injury Status: Date Acquired: 09/26/2022 Comorbid Anemia, Chronic Obstructive Pulmonary Disease (COPD), Coronary Weeks Of Treatment: 35 History: Artery Disease, Peripheral Arterial Disease, Peripheral Venous Clustered Wound: No Disease, Hepatitis C, Osteoarthritis, Neuropathy,  Received Chemotherapy Photos Wound Measurements Length: (cm) 2.5 Width: (cm) 1.8 Depth: (cm) 0.1 Area: (cm) 3.534 Volume: (cm) 0.353 % Reduction in Area: -542.5% % Reduction in Volume: -541.8% Wound Description Classification: Exudate Amount: Exudate Type: Exudate Color: Category/Stage III Medium Serosanguineous red, brown Treatment Notes Wound #18 (Toe Second) Wound Laterality: Right, Medial Cleanser Soap and Water Discharge Instruction: Gently cleanse wound with antibacterial soap, rinse and pat dry prior to dressing wounds Wound Cleanser Discharge Instruction: Wash your hands with soap and water. Remove old dressing, discard into plastic bag and place into trash. Cleanse the wound with Wound Cleanser prior to applying a clean dressing using gauze sponges, not tissues or cotton balls. Do not scrub or use excessive force. Pat dry using gauze sponges, not tissue or cotton balls. Peri-Wound Care Topical Primary Dressing Secondary Dressing Zetuvit Plus 4x8 (in/in) Secured With [employer] Sterile or Non-Sterile 6-ply 4.5x4 (yd/yd) Discharge Instruction: Apply Kerlix as directed Tubigrip Size D, 3x10 (in/yd) Compression Wrap Compression Stockings Add-Ons Electronic Signature(s) Signed: 06/19/2023 12:46:12 PM By: Midge Aver MSN RN CNS WTA Entered By: Midge Aver on 06/17/2023 10:01:08 [name]  ([id number]) [id number] Page 11 of 15 -------------------------------------------------------------------------------- Wound Assessment Details Patient Name: Date of Service: IMRANE, GIGANTI 06/17/2023 9:30 A M Medical Record Number: [id number] Patient Account Number: [id number] Date of Birth/Sex: Treating RN: 1960-08-26 (62 y.o. [name] Primary Care Virgie Kunda: [name] Other Clinician: Referring Kanton Kamel: Treating Prateek Knipple/Extender: [name] Weeks in Treatment: 79 Wound Status Wound Number: 21 Primary Venous Leg Ulcer Etiology: Wound Location: Right, Dorsal Foot Wound Open Wounding Event: Pressure Injury Status: Date Acquired: 03/02/2023 Comorbid Anemia, Chronic Obstructive Pulmonary Disease (COPD), Coronary Weeks Of Treatment: 11 History: Artery Disease, Peripheral Arterial Disease, Peripheral Venous Clustered Wound: Yes Disease, Hepatitis C, Osteoarthritis, Neuropathy, Received Chemotherapy Photos Wound Measurements Length: (cm) 4.5 Width: (cm) 4.5 Depth: (cm) 0.2 Area: (cm) 15.904 Volume: (cm) 3.181 % Reduction in Area: -800.1% % Reduction in Volume: -1697.2% Wound Description Classification: Full Thickness Without Exposed Support Exudate Amount: Medium Exudate Type: Serous Exudate Color: amber Structures Treatment Notes Wound #21 (Foot) Wound Laterality: Dorsal, Right Cleanser Soap and Water Discharge Instruction: Gently cleanse wound with antibacterial soap, rinse and pat dry prior to dressing wounds Wound Cleanser Discharge Instruction: Wash your hands with soap and water. Remove old dressing, discard into plastic bag and place into trash. Cleanse the wound with Wound Cleanser prior to applying a clean dressing using gauze sponges, not tissues or cotton balls. Do not scrub or use excessive force. Pat dry using gauze sponges, not tissue or cotton balls. Peri-Wound Care Topical Primary Dressing ATZIN, ONKEN ([id number]) [id number] Page 12 of 15 Silvercel 4 1/4x 4 1/4 (in/in) Discharge Instruction: Apply Silvercel 4 1/4x 4 1/4 (in/in) as instructed Secondary Dressing Zetuvit Plus 4x8 (in/in) Secured With [employer] Sterile or Non-Sterile 6-ply 4.5x4 (yd/yd) Discharge Instruction: Apply Kerlix as directed Tubigrip Size D, 3x10 (in/yd) Compression Wrap Compression Stockings Add-Ons Electronic Signature(s) Signed: 06/19/2023 12:46:12 PM By: Midge Aver MSN RN CNS WTA Entered By: Midge Aver on 06/17/2023 10:01:31 -------------------------------------------------------------------------------- Wound Assessment Details Patient Name: Date of Service: Chad [name] 06/17/2023 9:30 A M Medical Record Number: [id number] Patient Account Number: [id number] Date of Birth/Sex: Treating RN: Mar 13, 1961 (62 y.o. [name] Primary Care Kaisyn Millea: [name] Other Clinician: Referring Brannon Levene: Treating Alonnie Bieker/Extender: [name] Weeks in Treatment: 79 Wound Status Wound Number: 22 Primary Venous Leg Ulcer Etiology: Wound Location: Right, Anterior Lower Leg Wound Open Wounding Event:  Pressure Injury Status: Date Acquired: 03/02/2023 Comorbid Anemia, Chronic Obstructive Pulmonary Disease (COPD), Coronary Weeks Of Treatment: 11 History: Artery Disease, Peripheral Arterial Disease, Peripheral Venous Clustered Wound: Yes Disease, Hepatitis C, Osteoarthritis, Neuropathy, Received Chemotherapy Photos Wound Measurements Length: (cm) 14 Width: (cm) 16.5 Depth: (cm) 0.1 Area: (cm) 181.427 Volume: (cm) 18.143 % Reduction in Area: -7600.6% % Reduction in Volume: -3752% Wound Description Classification: Full Thickness Without Exposed Support Structures Fabre, [name] ([id number]) Exudate Amount: Medium Exudate Type: Serous Exudate Color: amber [id number] Page 13 of 15 Treatment Notes Wound #22 (Lower Leg)  Wound Laterality: Right, Anterior Cleanser Soap and Water Discharge Instruction: Gently cleanse wound with antibacterial soap, rinse and pat dry prior to dressing wounds Wound Cleanser Discharge Instruction: Wash your hands with soap and water. Remove old dressing, discard into plastic bag and place into trash. Cleanse the wound with Wound Cleanser prior to applying a clean dressing using gauze sponges, not tissues or cotton balls. Do not scrub or use excessive force. Pat dry using gauze sponges, not tissue or cotton balls. Peri-Wound Care Topical Primary Dressing Silvercel 4 1/4x 4 1/4 (in/in) Discharge Instruction: Apply Silvercel 4 1/4x 4 1/4 (in/in) as instructed Secondary Dressing Zetuvit Plus 4x8 (in/in) Secured With [employer] Sterile or Non-Sterile 6-ply 4.5x4 (yd/yd) Discharge Instruction: Apply Kerlix as directed Tubigrip Size D, 3x10 (in/yd) Compression Wrap Compression Stockings Add-Ons Electronic Signature(s) Signed: 06/19/2023 12:46:12 PM By: Midge Aver MSN RN CNS WTA Entered By: Midge Aver on 06/17/2023 10:02:08 -------------------------------------------------------------------------------- Wound Assessment Details Patient Name: Date of Service: Chad [name] 06/17/2023 9:30 A M Medical Record Number: [id number] Patient Account Number: [id number] Date of Birth/Sex: Treating RN: [birth year] (62 y.o. [name] Primary Care Halla Chopp: [name] Other Clinician: Referring Keirstyn Aydt: Treating Joell Usman/Extender: [name] Weeks in Treatment: 79 Wound Status Wound Number: 23 Primary Pressure Ulcer Etiology: Wound Location: Right, Posterior Lower Leg Wound Open Wounding Event: Pressure Injury Status: Date Acquired: 05/02/2023 Comorbid Anemia, Chronic Obstructive Pulmonary Disease (COPD), Coronary Weeks Of Treatment: 5 History: Artery Disease, Peripheral Arterial Disease, Peripheral Venous Clustered Wound: Yes Disease, Hepatitis C,  Osteoarthritis, Neuropathy, Received Chemotherapy Photos JEREN, SWETT ([id number]) [id number] Page 14 of 15 Wound Measurements Length: (cm) 6.5 Width: (cm) 5 Depth: (cm) 0.1 Area: (cm) 25.525 Volume: (cm) 2.553 % Reduction in Area: -62.5% % Reduction in Volume: -62.5% Wound Description Classification: Exudate Amount: Exudate Type: Exudate Color: Category/Stage III Medium Serosanguineous red, brown Treatment Notes Wound #23 (Lower Leg) Wound Laterality: Right, Posterior Cleanser Soap and Water Discharge Instruction: Gently cleanse wound with antibacterial soap, rinse and pat dry prior to dressing wounds Wound Cleanser Discharge Instruction: Wash your hands with soap and water. Remove old dressing, discard into plastic bag and place into trash. Cleanse the wound with Wound Cleanser prior to applying a clean dressing using gauze sponges, not tissues or cotton balls. Do not scrub or use excessive force. Pat dry using gauze sponges, not tissue or cotton balls. Peri-Wound Care Topical Primary Dressing Silvercel 4 1/4x 4 1/4 (in/in) Discharge Instruction: Apply Silvercel 4 1/4x 4 1/4 (in/in) as instructed Secondary Dressing Zetuvit Plus 4x8 (in/in) Secured With [employer] Sterile or Non-Sterile 6-ply 4.5x4 (yd/yd) Discharge Instruction: Apply Kerlix as directed Tubigrip Size D, 3x10 (in/yd) Compression Wrap Compression Stockings Add-Ons Electronic Signature(s) Signed: 06/19/2023 12:46:12 PM By: Midge Aver MSN RN CNS WTA Entered By: Midge Aver on 06/17/2023 10:02:32 [name] ([id number]) [id number] Page 15 of 15 -------------------------------------------------------------------------------- Vitals Details Patient Name: Date of Service: SEVREN, KETT 06/17/2023 9:30 A  M Medical Record Number: [id number] Patient Account Number: [id number] Date of Birth/Sex: Treating RN: [birth year] (62 y.o. [name] Primary Care Vita Currin: [name] Other Clinician: Referring Elyjah Hazan: Treating Donnamae Muilenburg/Extender: [name] Weeks in Treatment: 79 Vital Signs Time Taken: 09:43 Temperature (F): 98.3 Height (in): 69 Pulse (bpm): 90 Weight (lbs): 150 Respiratory Rate (breaths/min): 18 Body Mass Index (BMI): 22.1 Blood Pressure (mmHg): 111/92 Reference Range: 80 - 120 mg / dl Electronic Signature(s) Signed: 06/19/2023 12:46:12 PM By: Midge Aver MSN RN CNS WTA Entered By: Midge Aver on 06/17/2023 09:44:03

## 2023-06-17 NOTE — Progress Notes (Addendum)
Chad [name], ROYE ([id number]) [id number] Page 1 of 17 Visit Report for 06/17/2023 Chief Complaint Document Details Patient Name: Date of Service: Chad [name] 06/17/2023 9:30 A M Medical Record Number: [id number] Patient Account Number: [id number] Date of Birth/Sex: Treating RN: 27-Oct-1960 (62 y.o. [name] Primary Care Provider: [name] Other Clinician: Referring Provider: Treating Provider/Extender: [name] Weeks in Treatment: 5 Information Obtained from: Patient Chief Complaint Left ankle ulcer, Left dorsal foot wound, right heel Electronic Signature(s) Signed: 06/17/2023 9:19:03 AM By: [name] PA-C Entered By: [name] on 06/17/2023 09:19:03 -------------------------------------------------------------------------------- HPI Details Patient Name: Date of Service: Chad [name] 06/17/2023 9:30 A M Medical Record Number: [id number] Patient Account Number: [id number] Date of Birth/Sex: Treating RN: 10/08/1960 (62 y.o. [name] Primary Care Provider: [name] Other Clinician: Referring Provider: Treating Provider/Extender: [name] in Treatment: 3 History of Present Illness HPI Description: 10/08/18 on evaluation today patient actually presents to our office for initial evaluation concerning wounds that he has of the bilateral lower extremities. 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 He also has bilateral venous stasis. He apparently has been homeless currently following discharge from the hospital apparently he has been placed at [facility] which is is a skilled nursing facility locally. Nonetheless fortunately he does not show any signs of infection at this time which is good news. In fact several of the wound  actually appears to be showing some signs of improvement already in my pinion. There are a couple areas in the left leg in particular there likely gonna require some sharp debridement to help clear away some necrotic tissue and help with more sufficient healing. No fevers, chills, nausea, or vomiting noted at this time. 10/15/18 on evaluation today patient actually appears to be doing very well in regard to his bilateral lower extremities. He's been tolerating the dressing changes without complication. Fortunately there does not appear to be any evidence of active infection at this time which is great news. Overall I'm actually very pleased with how this has progressed in just one visits time. Readmission: 08/14/2020 upon evaluation today patient presents for re-evaluation here in our clinic. He is having issues with his left ankle region as well as his right toe and his right heel. He tells me that the toe and heel actually began as a area that was itching that he was scratching and then subsequently opened up into wounds. These may have been abscess areas I presume based on what I am seeing currently. With regard to his left ankle region he tells me this was a similar type Chad [name] ([id number]) [id number] Page 2 of 17 occurrence although he does have venous stasis this very well may be more of a venous leg ulcer more than anything. Nonetheless I do believe that the patient would benefit from appropriate and aggressive wound care to try to help get things under better control here. He does have history of a stroke on the left side affecting him to some degree there that he is able to stand although he does have some residual weakness. Otherwise again the patient does have chronic venous insufficiency as previously noted. His arterial studies most recently obtained showed that he had an ABI on the right of 1.16 with a TBI of 0.52 and on the left and ABI of 1.14 with  a  TBI of 0.81. That was obtained on 06/19/2020. 08/28/2020 upon evaluation today patient appears to be doing decently well in regard to his wounds in general. He has been tolerating the dressing changes without complication. Fortunately there does not appear to be any signs of active infection which is great news. With that being said I think the [facility] is doing a good job I would recommend that we likely continue with that currently. 09/11/2020 upon evaluation today patient's wounds did not appear to be doing too poorly but again he is not really showing signs of significant improvement with regard to any of the wounds on the right. None of them have Hydrofera Blue on them I am not exactly sure why this is not being followed as the facility did not contact us to let us know of any issues with obtaining dressings or otherwise. With that being said he is supposed to be using Hydrofera Blue on both of the wounds on the right foot as well as the ankle wound on the left side. 09/18/2020 upon evaluation today patient appears to be doing poorly with regard to his wounds. Again right now the left ankle in particular showed signs of extreme maceration. Apparently he was told by someone with staff at [facility] they could not get the [facility]. With that being said this is something that is never been relayed to [place] one way or another. Also the patient subsequently has not supposed to have a border gauze dressing on. He should have an ABD pad and roll gauze to secure as this drains much too much just to have a border gauze dressing to cover. Nonetheless the fact that they are not using the appropriate dressing is directly causing deterioration of the left ankle wound it is significantly worse today compared to what it was previous. I did attempt to call [facility] while the patient was here I called three times and got no one to even pick up the phone. After this I had my for an  office coordinator call and she was able to finally get through and leave a message with the D ON as of dictation of this note which is roughly about an hour and a half later I still have not been able to speak with anyone at the facility. 09/25/2020 upon evaluation today patient actually showing signs of good improvement which is excellent news. He has been tolerating the dressing changes without complication. Fortunately there is no signs of active infection which is great news. No fevers, chills, nausea, vomiting, or diarrhea. I do feel like the facility has been doing a much better job at taking care of him as far as the dressings are concerned. However the director of nursing never did call me back. 10/09/2020 upon evaluation today patient appears to be doing well with regard to his wound. The toe ulcer did require some debridement but the other 2 areas actually appear to be doing quite well. 10/19/2020 upon evaluation today patient actually appears to be doing very well in regard to his wounds. In fact the heel does appear to be completely healed. The toe is doing better in the medial ankle on the left is also doing better. Overall I think he is headed in the right direction. 10/26/2020 upon evaluation today patient appears to be doing well with regard to his wound. He is showing signs of improvement which is great news and overall I am very pleased with where things stand today. No  fevers, chills, nausea, vomiting, or diarrhea. 11/02/2020 upon evaluation today patient appears to be doing well with regard to his wounds. He has been tolerating the dressing changes without complication overall I am extremely pleased with where things stand today. He in regard to the toe is almost completely healed and the medial ankle on the left is doing much better. 11/09/2020 upon evaluation today patient appears to be doing a little poorly in regard to his left medial ankle ulcer. Fortunately there does not appear to  be any signs of systemic infection but unfortunately locally he does appear to be infected in fact he has blue-green drainage consistent with Pseudomonas. 11/16/2020 upon evaluation today patient appears to be doing well with regard to his wound. It actually appears to be doing better. I did place him on gentamicin cream since the Cipro was actually resistant even though he was positive for Pseudomonas on culture. Overall I think that he does seem to be doing better though I am unsure whether or not they have actually been putting the cream on. The patient is not sure that we did talk to the nurse directly and she was going to initiate that treatment. Fortunately there does not appear to be any signs of active infection at this time. No fevers, chills, nausea, vomiting, or diarrhea. 4/28; the area on the right second toe is close to healed. Left medial ankle required debridement 12/07/2020 upon evaluation today patient appears to be doing well with regard to his wounds. In fact the right second toe appears to be completely healed which is great news. Fortunately there does not appear to be any signs of active infection at this time which is also great news. I think we can probably discontinue the gentamicin on top of everything else. 12/14/2020 upon evaluation today patient appears to be doing well with regard to his wound. He is making good progress and overall very pleased with where things stand today. There is no signs of active infection at this time which is great news. 12/28/2020 upon evaluation today patient appears to be doing well with regard to his wounds. He has been tolerating the dressing changes without complication. Fortunately there is no signs of active infection at this time. No fevers, chills, nausea, vomiting, or diarrhea. 12/28/2020 upon evaluation today patient's wound bed actually showed signs of excellent improvement. He has great epithelization and granulation I do not see any  signs of infection overall I am extremely pleased with where things stand at this point. No fevers, chills, nausea, vomiting, or diarrhea. 01/11/2021 upon evaluation today patient appears to be doing well with regard to his wound on his leg. He has been tolerating the dressing changes without complication. Fortunately there does not appear to be any signs of active infection which is great news. No fevers, chills, nausea, vomiting, or diarrhea. 01/25/2021 upon evaluation today patient appears to be doing well with regard to his wound. He has been tolerating the dressing changes without complication. Fortunately the collagen seems to be doing a great job which is excellent news. No fevers, chills, nausea, vomiting, or diarrhea. 02/08/2021 upon evaluation today patient's wound is actually looking a little bit worse especially in the periwound compared to previous. Fortunately there does not appear to be any signs of infection which is great news with that being said he does have some irritation around the periphery of the wound which has me more concerned. He actually had a dressing on that had not been changed in 3  days. He also is supposed to have daily dressing changes. With regard to the dressing applied he had a silver alginate dressing and silver collagen is what is recommended and ordered. He also had no Desitin around the edges of the wound in the periwound region although that is on the order inspect to be done as well. In general I was very concerned I did contact [facility] actually spoke with Verlon Au who is the scheduling individual and subsequently she stated that she would pass the information to the D ON apparently the D ON was not available to talk to me when I call today. 02/18/2021 upon evaluation today patient's wound is actually showing signs of improvement. Fortunately there does not appear to be any evidence of infection which is great news overall I am extremely pleased with  where things stand today. No fevers, chills, nausea, vomiting, or diarrhea. 8/3; patient presents for 1 week follow-up. He has no issues or complaints today. He denies signs of infection. 03/11/2021 upon evaluation today patient appears to be doing well with regard to his wound. He does have a little bit of slough noted on the surface of the wound but fortunately there does not appear to be any signs of active infection at this time. No fevers, chills, nausea, vomiting, or diarrhea. 03/18/2021 upon evaluation today patient appears to be doing well with regard to his wound. He has been tolerating the dressing changes without complication. There was a little irritation more proximal to where the wound was that was not noted last week but nonetheless this is very superficial just seems to be more irritation we just need to make sure to put a good amount of the zinc over the area in my opinion. Otherwise he does not seem to be doing significantly worse at all which is great news. 03/25/2021 upon evaluation today patient appears to be doing well with regard to his wound. He is going require some sharp debridement today to clear with CECIL, ARNDT ([id number]) [id number] Page 3 of 17 some of the necrotic debris. I did perform this today without complication postdebridement wound bed appears to be doing much better this is great news. 04/08/2021 upon evaluation today patient appears to be doing decently well in regard to his wound although the overall measurement is not significantly smaller compared to previous. It is gone down a little bit but still the facility continues to not really put the appropriate dressings in place in fact he was supposed to have collagen we think he probably had more of an allergy to At this point. Fortunately there does not appear to be any signs of active infection systemically though locally I do not see anything on initial visualization either as  far as erythema or warmth. 04/15/2021 upon evaluation today patient appears to be doing well with regard to his wound. He is actually showing signs of improvement. I did place him on antibiotics last week, Cipro. He has been taking that 2 times a day and seems to be tolerating it very well. I do not see any evidence of worsening and in fact the overall appearance of the wound is smaller today which is also great news. 9/26; left medial ankle chronic venous insufficiency wound is improved. Using Hydrofera Blue 10/10; left medial ankle chronic venous insufficiency. Wound has not changed much in appearance completely nonviable surface. Apparently there have been problems getting the right product on the wound at the facility although he came in with [facility] on today  05/14/2021 upon evaluation today patient appears to be doing well with regard to his wound. I think he is making progress here which is good news. Fortunately there does not appear to be any signs of active infection at this time. No fevers, chills, nausea, vomiting, or diarrhea. 05/20/2021 upon evaluation today patient appears to be doing well with regard to his wound. He is showing signs of good improvement which is great news. There does not appear to be any evidence of active infection which is also excellent news. No fevers, chills, nausea, vomiting, or diarrhea. 05/28/2021 upon evaluation today patient appears to be doing quite well. There does not appear to be any signs of active infection at this time which is great news. Overall I am extremely pleased with where things stand today. I think he is headed in the right direction. 06/11/2021 upon evaluation today patient appears to be doing well with regard to his left ankle ulcer and poorly in regard to the toe ulcer on the second toe right foot. This appears to show signs of joint exposure. Apparently this has been present for 1 to 2 months although he kept forgetting to tell me  about it. That is unfortunate as right now it definitely appears to be doing significantly worse than what I would like to see. There does not appear to be any signs of active infection systemically though locally I am concerned about the possibility of infection the toe is quite red. Again no one from the facility ever contacted [place] to advise that this was going on in the interim either. 06/17/2021 upon evaluation today patient presents for follow-up I did review his x-ray which showed a navicular bone fracture I am unsure of the chronicity of this. Subsequently he also had osteomyelitis of the toe which was what I was more concerned about this did not show up on x-ray but did show up on the pathology scrapings. This was listed as acute osteomyelitis. Nonetheless at this point I think that the antibiotic treatment is the best regimen to go with currently. The patient is in agreement with that plan. Nonetheless he has initially 30 days of doxycycline off likely extend that towards the end of the treatment cycle that will be around the middle of December for an additional 2 weeks. That all depends on how well he continues to heal. Nonetheless based on what I am seeing in the foot I did want a proceed with an MRI as well which I think will be helpful to identify if there is anything else that needs to be addressed from the standpoint of infection. 06/24/2021 upon evaluation today patient appears to be doing pretty well in regards to his wounds. I think both are actually showing signs of improvement which is good I did review his MRI today which did show signs of osteomyelitis of the middle and proximal phalanx on his right foot of the affected toe. With that being said this is actually showing signs of significant improvement today already with the antibiotic therapy I think the redness is also improved. Overall I think that we just need to give this some time with appropriate wound care we will see how  things go potentially hyperbarics could be considered. 07/02/2021 upon inspection today patient actually appears to be doing well in regard to his left ankle which is getting very close to complete resolution of pleased in that regard. Unfortunately he is continuing to have issues with his second toe right foot and this seems  to still be very painful for him. Recommend he try something different from the standpoint of antibiotics. 07/15/2021 upon evaluation today patient appears to be doing actually pretty well in regard to his foot. This is actually showing signs of significant improvement which is great news. Overall I feel like the patient is improving both in regard to the second toe as well as the ankle on the left. With that being said the biggest issue that I do see currently is that he is needing to have a refill of the doxycycline that we previously treated him with. He also did see podiatry they are not going to recommend any amputation at this point since he seems to be doing quite well. For that reason we just need to keep things under control from an infection standpoint. 08/01/2021 upon evaluation today patient appears to be doing well with regard to his wound. He has been tolerating the dressing changes without complication. Fortunately there does not appear to be any evidence of active infection locally nor systemically at this point. In fact I think everything is doing excellent in fact his second toe on the right foot is almost healed and the ankle on the left ankle region is actually very close to being healed as well. 08/08/2021 upon evaluation today patient appears to be doing well with regard to his wound. He has been tolerating the dressing changes without complication. Fortunately I do not see any signs of active infection at this time. Readmission: 12-06-2021 upon evaluation today patient presents for reevaluation here in the clinic he does tell me that he was being seen in  facility at [facility] by a provider that was coming in. He is not sure who this was. He tells me however that the wound seems to have gotten worse even compared to where it was when we last saw him at this point. With that being said I do believe that he is likely going need ongoing wound care here in the clinic and I do believe that we need to be the ones to frontline this since his wound does seem to be getting worse not better at this point. He voiced understanding. He is also in agreement with this plan and feels more comfortable coming here she tells me. Patient's medical history really has not changed since his prior admission he was only gone since January. 12-27-2021 upon evaluation today patient appears to be doing well with regard to his wound they did run out of the [facility] so they did not put anything on just an ABD pad with gentamicin. Still we are seeing some signs of good improvement here with some new epithelization which is great news. 01-10-2022 upon evaluation today patient appears to be doing well with regard to his wounds and he is going require some sharp debridement but overall seems to be making good progress. Fortunately I do not see any evidence of active infection locally or systemically at this time which is great news. 01-24-2022 upon evaluation today patient appears to be doing well with regard to his wound. The facility actually came and dropped him off early and he had another appointment at the hospital and then they just brought him over here and this was still hours before his appointment this afternoon. For that reason we did do our best to work him in this morning and fortunately had some space to make this happen. With that being said patient's wound does seem to be making progress here and I  am very pleased in that regard I do not see any signs of active infection locally or systemically at this time. 02-07-2022 patient appears to be doing well  currently in regard to his wounds. In fact one of them the more proximal is healed the distal is still open but seems to be doing excellent. Fortunately I do not see any evidence of active infection locally or systemically at this time which is great news. No fevers, chills, nausea, vomiting, or diarrhea. 7/27; left medial ankle venous. Improving per our intake nurse. We are using Hydrofera Blue under Tubigrip compression. They are changing that at his facility 03-06-2022 upon evaluation today patient appears to be doing well with regard to the his wound he is can require some sharp debridement but seems to be making excellent progress. Fortunately I do not see any evidence of active infection locally or systemically which is great news. 03-27-2022 upon evaluation today patient's wound is actually showing signs of excellent improvement. Fortunately I see no signs of active infection locally or systemically at this time which is great news. No fevers, chills, nausea, vomiting, or diarrhea. Chad [name], Chad [name] ([id number]) [id number] Page 4 of 17 04-21-2022 upon evaluation today patient appears to be doing excellent in regard to his wound in fact this is very close to resolution based on what I am seeing. I do not see any evidence of active infection locally or systemically at this time which is great news and overall I am extremely pleased with where we are today. 05-05-2022 upon evaluation today patient's wound actually appears to potentially be completely healed. Fortunately I do not see any evidence of active infection at this time which is great news and overall I am very pleased I think this needs a little time to toughen up but other than that I really do believe were doing quite well. He is very pleased to hear this its been a long time coming. 05-19-2022 upon evaluation today patient appears to be doing well currently in regard to his wound. He in fact we were hoping will  be completely healed and closed but it still has a very tiny area right in the center which is still continuing to drain. Fortunately I do not see any signs of infection locally or systemically which is great news. 11/6; this wound is close to completely" he comes in this week with some erythema at 1 edge of this which looks like something was rubbing on the area. Other than that no evidence of infection 06-16-2022 upon evaluation today patient appears to be doing well currently in regard to his ankle ulcer. This is very close to being healed but still has a small area in the central portion which is not. Fortunately there does not appear to be any signs of infection locally or systemically which is great news. No fevers, chills, nausea, vomiting, or diarrhea. 06-30-2022 upon evaluation patient's wound pretty much appears to be almost completely closed. In fact it may even be closed but there are still a lot of skin irritation around the edges of the wound and I just do not feel great about releasing them yet I would like to continue to monitor this just a little bit longer before getting to that point. He is not opposed to this and in fact is happy to continue to come in if need be in order to make sure that things are moving in the right direction he definitely does not want to backtrack. 07-14-2022 upon evaluation today patient  appears to be doing well currently in regard to his wound. Has been tolerating the dressing changes without complication. Fortunately I see no evidence of active infection locally nor systemically which is great news. No fevers, chills, nausea, vomiting, or diarrhea. 08-11-2022 upon evaluation today patient appears to be doing a little worse than last time I saw him although it has been a month. Unfortunately he was not able to be seen due to the fact that he was quarantined in the facility with COVID. Subsequently he tells me that he mention to the facility staff several  times about taking it off over the past several weeks but they continue to tell him that someone have to get in touch with [place] here although nobody ever did until Friday when the nurse manager called to have that documented in the communication notes. With that being said unfortunately this means that he had a wrap on that he was supposed to have on for only 1 week for ended up being on four 1 month essentially. I am glad that there is nothing worse than what we see currently to be perfectly honest. 08-19-2022 upon evaluation today patient appears to be doing well currently in regard to his wounds. He has been tolerating the dressing changes without complication. This is actually smaller today which is great news after last week's reopening. Fortunately I do not see any evidence of infection locally nor systemically at this point. 08-26-2022 upon evaluation today patient appears to be doing well currently in regard to his wound. He has been tolerating the dressing changes without complication. Fortunately there does not appear to be any signs of active infection locally nor systemically which is great news. No fevers, chills, nausea, vomiting, or diarrhea. 09-02-2022 upon evaluation today patient's wound actually showing signs of excellent improvement this is measuring smaller and looking much better. I am very pleased with where we stand I do believe that we are headed in the right direction. 09-09-2022 upon evaluation today patient appears to be doing decently well in regard to his leg in general although there was some swelling this is the 1 thing that I am not too happy with based on what I see currently. Fortunately there does not appear to be any signs of infection locally nor systemically at this point. 09-16-2022 upon evaluation today patient appears to be doing well currently in regard to his wound. He is actually showing signs of improvement he wore the Tubigrip this week and it significantly  better compared to last week's evaluation. Fortunately I do not see any evidence of active infection locally or systemically which is great news. Overall I think that were headed in the right direction which is great news. In fact overall I think this is the best that he has looked in some time. He does not like the Tubigrip but actually did extremely well with this in my opinion. I think he needs to continue to utilize this. 09-23-2022 upon evaluation today patient appears to be doing well currently in regard to his wound. He has been tolerating the dressing changes without complication. Fortunately there does not appear to be any signs of active infection locally nor systemically which is great news and in general I do feel like that we are headed in the right direction. He has been having trouble with the Tubigrip however we have gotten the facility to order compression socks he has not gotten them as of yet. 3/4; patient is about the same with a wound  on the left posterior leg. This is a venous wound. He needs more compression on this leg but he took off the previous wraps we are using. I am not certain whether he is using juxta lite stockings at the nursing home. 3/12; patient presents for follow-up. He unfortunately has 2 new wounds 1 to the dorsal left side and the other to the right heel. He is not sure how these happened. The right heel appears to be caused by pressure. He has been using Hydrofera Blue to the original wound on the left leg. 10-14-2022 upon evaluation today patient appears to be doing well currently in regard to his original wound but unfortunately he has several new wounds which I was completely unaware of before walking into the room to see him today. Unfortunately he seems to not be doing nearly as well as what I saw when I last had an appointment with him 3 weeks ago. Fortunately I do not see any signs of systemic infection though unfortunately locally he has definite  infection of the left lower extremity. He is currently on doxycycline I am going to likely have to extend that today. 10-21-2022 upon evaluation today patient appears to be doing well currently in regard to his wounds. He seems to be tolerating the dressing changes without complication. Fortunately there does not appear to be any signs of active infection locally or systemically which is great news and in general I definitely feel like we are on the right track here. 10-30-2022 upon evaluation today patient still continues to not be doing too well at all with regard to his wounds. Unfortunately he is still having signs of infection as well which also has been concerned. Fortunately I do not see any signs of active infection locally nor systemically at this time. No fevers, chills, nausea, vomiting, or diarrhea. 11-06-2022 upon evaluation today patient appears to be doing okay currently in regard to his wounds. All things considered with his blood flow not being nearly as good as what should I think that he is making pretty good progress here. Fortunately I do not see any signs of infection locally nor systemically which is great news. No fevers, chills, nausea, vomiting, or diarrhea. 11-13-2022 upon evaluation today patient appears to be doing poorly currently in regard to his wounds. Things seem to be getting a little bit worse not better. 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 Subsequently he is also going to need to have vascular intervention but we are unsure exactly when this is going on the undertaking. I would try to actually send a message to Dr. Hazle Quant and the vascular team just to see what exactly the plan is going forward. The patient is in agreement with that plan I just think we need to have a plan especially in light of the fact the left leg seems to be getting a little bit worse at this  point. 11-25-2022 upon evaluation today patient is currently now discharged from the hospital following his colostomy procedure. That did very well and he seems to [facility], [name] ([id number]) [id number] Page 5 of 17 be doing well. Fortunately there does not appear to be any signs of active infection locally or systemically which is great news. No fevers, chills, nausea, vomiting, or diarrhea. 12-04-2022 upon evaluation today patient appears to be doing actually decently well in regard to his wounds and actually very pleased  with where we stand and I think that he is making some pretty good progress here. Fortunately I do not see any signs of active infection locally nor systemically which is great news. No fevers, chills, nausea, vomiting, or diarrhea. 12-12-2022 upon evaluation today patient appears to be doing well currently in regard to his wound. He has been tolerating the dressing changes without complication. The wounds all seem to be making some progress to a degree. He has appointment with vascular on the 23rd of this month. 5/24; the patient was scheduled for a left lower extremity angiogram yesterday but apparently they were overbooked and they wanted to admit him to hospital last night so they could do this early this morning but he refused I see that Dr. Wyn Quaker has rescheduled him for June 6. He is using silver alginate to wounds on his bilateral lower extremities. 01-02-2023 upon evaluation today patient appears to be doing poorly in regard to his wounds still. Subsequently he did have his angiogram but apparently there really was not much that was found that could be revascularized. The patient tells me that Dr. Wyn Quaker was going to contact me here in the office I have not heard from him as of yet and I may see about giving him a call just to follow-up or possibly send a message through epic which could be an option as well. 01-09-2023 upon evaluation today patient  appears to be doing poorly in regard to his legs he is extremely swollen. He has been sleeping in his chair he tells me for the past week. His bed is broken and they have ordered a new one which is supposedly supposed to be there today. With that being said he the way he needs to be not sleeping in his chair that is his wheelchair every day and through the night. This is causing his legs to be much more swollen and it seen and how badly wounds are doing at this point. This has made things significantly worse. Fortunately I do not see any signs of infection right now unfortunately I do believe that the patient has a tremendous amount of drainage currently. 01-19-2023 upon evaluation today patient appears to be doing poorly in regard to his wounds of his legs his swelling is better but still not as good as what we would like to see. Fortunately I do not see any evidence of active infection locally nor systemically which is great news and in general I do believe that we are moving in the right direction here. Nonetheless it would be better if she was in the bed sleeping or not send up in his wheelchair although he seems to still be set up in the wheelchair at this time. He tells me that he is just not as comfortable in the bed as the wheelchair but unfortunately this is causing damage to his legs which I discussed with him today as well. Nonetheless I am not sure that he is going to be amendable to doing anything different. 01-27-2023 upon evaluation today patient appears to be doing well currently. Fortunately I do not see any evidence of active infection locally nor systemically which is great news I think he is actually doing the best option and while in fact I would try to see if we can do some debridement today to get things moving along. 02-03-2023 upon evaluation today patient's wounds unfortunately are not doing nearly as well. Will be doing the [employer] wraps unfortunately we just not seeing the  improvement that I would like to see. Fortunately I do not see any evidence of infection at this time which is good news unfortunately I do think that he needs to be changed on a daily basis due to the amount of drainage otherwise I do not think this is really going to continue to improve dramatically. 02-12-2023 upon evaluation today patient appears to be doing better in a lot of ways in regard to his wounds and although his toes on the left side actually seem a bit worse. Fortunately I do not see any evidence of active infection locally nor systemically at this time which is good news. With that being said he is having some bleeding from the left leg I think that this is an area that looks like it probably got bumped. With that being said I discussed this with the patient he tells me he does not know of any trauma that occurred but at the same time if was not this that it was skin that got pulled off. Dressing change there is only one of the 2 that could have happened. 7/25; this is a patient who is very frail at [facility] nursing facility. He is nonambulatory. He has wounds on his bilateral lower legs. However much worse on the left we have been using silver cell to all of his wounds He comes in today and a lot more pain our intake nurse reports deterioration of the left foot. I ended up looking through some of his record. 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 I am really not certain of the stage of  this currently or who is following 02-26-2023 upon evaluation today patient unfortunately appears to be doing significantly worse compared to where he was previous. The left foot is showing signs of dramatic worsening in regard to the overall appearance his toes are starting to become necrotic almost in full and he has much more cyanosis present than what I even seen previous. He is already seeing Dr. Wyn Quaker and unfortunately there was not anything that they saw that was an immediate cause for this but at the same time I feel like this may have changed and even worsened I feel like this could be a vascular event and I believe this is a limb-threatening situation. 03/26/2023; I last saw this patient on 8/1. Wondered about below-knee amputations versus palliative care. 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 The wound has healed fairly well and he follows up with surgery today. He is back today looking at wounds on the right foot and lower leg. This includes 2 areas on the right heel, right dorsal foot right dorsal first and second toes and a small area on the right anterior lower tibia area. He has a lot of pain in this leg as well. The angiogram he had on 01/01/2023 specific to the right leg now demonstrated a fairly normal common femoral artery, profunda femoris artery and superficial and popliteal arteries without significant disease. 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 No doubt this has a lot to do with the discomfort in the right leg he describes when he is in bed. They have been using Xeroform on the wounds on the right. He has a lot of swelling in this leg with weeping edema 04-09-2023 upon evaluation today patient appears to be doing poorly still in regard to his right leg. 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  Fortunately I do not see any signs of active infection locally or systemically at this time which is great news. No fevers, chills, nausea, vomiting, or diarrhea. With that being said I am concerned still about the fact that he seems to be getting worse especially in regard to the heels I think that he is still sitting up most of the day which I think is still causing some swelling of his leg unfortunately. 04-16-2023 upon evaluation today patient actually appears to be doing little bit more poorly compared to last week this actually does appear to be infected. Last week I really did not think that was the case but this seems like it may indeed be the case. Fortunately I do not see any signs of active infection locally or systemically at this time. 04-24-2023 upon evaluation today patient appears to be doing well currently in regard to his leg compared to where we were previous I do feel like that the antibiotics have been of benefit. Fortunately I do not see any signs of active infection at this time which is great news and in general I believe that we are making headway towards healing which is great news as well. With that being said he still has a lot of drainage I really feel like something like Anola Gurney would be better for him than just an ABD pad it would likely the drainage more or even Zetuvit's but I am not sure whether we can get either 1 of these at the facility for him. 05-01-2023 upon evaluation today patient appears to be doing well currently in regard to his wounds which are showing signs of significant improvement is not any way shape or form infected like it was last time I saw him. I think the antibiotics are doing a good job but I think he may need an extension here to ensure MUAAD, KRAVCHENKO ([id number]) [id number] Page 6 of 17 that it completely clears. 05-08-2023 upon evaluation today patient appears to be doing well currently in regard to his wounds for  the most part. He still has blue-green drainage noted but in general seems to be doing much better compared to where we were previous. Fortunately I do not see any signs of active infection locally or systemically which is great news and in general I do believe that we are making good headway towards complete closure. 05-18-2023 upon evaluation today patient appears to be doing well currently in regard to his wounds I feel like he is definitely making some improvements here. This is a slow improvement but nonetheless improvement. I do believe that he is doing quite well as far as infection is concerned which is also good news. 05-25-2023 upon evaluation today patient actually appears to be doing worse in regard to his legs at this point. Unfortunately he continues to have issues with significant drainage which is the primary concern here. I do feel like that the wound currently is not doing a whole lot better  when he was on the Levaquin I felt like it was improving some but the concern there is QT prolongation I do not want to continue that personally further I think he needs a referral ASAP to infectious disease. I discussed that with him today. He also needs to be having this changed every day and that has not been happening at this point. That is my biggest concern here. 06-09-2023 upon evaluation today patient appears to be doing still poorly in regard to this right leg which is draining profusely. This is unfortunately just continuing to show signs of being very moist and wet and weeping quite significantly. This is despite everything that we are doing including the facility have been placed him on Levaquin by ID which unfortunately just does not seem to be helping to heal this as well as we would hope either. I do have an infectious disease appointment that we sent 10 for him and that is actually scheduled for next week on the 19th. He has an appointment with vascular on the 14th of this week  although I think they are looking at his left leg primarily at the amputation site. 06-17-2023 upon evaluation today patient appears to be doing still poorly in regard to his legs at this point. Unfortunately he still seems to be having issues with significant drainage we are having this changed every single day. With that being said despite this the patient just does not seem to be making a lot of headway here towards closure. We are trying to do what we can from the standpoint of compression as well. He did see infectious disease yesterday. I did review that note as well. Upon evaluation the note actually states that the patient told the physician "do I have to keep suffering with wounds forevero Is there a way to get rid of these woundso However he did not want a referral to see Dr. Lajoyce Corners for potential amputation which seems to be 1 option that was proposed. With that being said he also apparently told her that he was "adamant about not doing another course of antibiotics as he feels like they have not healed his wounds". It appears based on evaluation with regard to the record that the patient did have a discussion with her about the possibility of doing a short course of IV cefepime for 14 days to cover Serr[place] as well as the other bacteria noted on culture. However the patient declined this as he felt like the antibiotics in the past he had taken did not help at all and there were no signs and symptoms to suggest sepsis. Therefore she recommended continuing wound care and to continue to follow-up with vascular follow-up in 2 weeks with her. I appreciate this evaluation and again I cannot do anything with the fact that the patient did not want to proceed with any further treatment with regard to the bacterial infection. Electronic Signature(s) Signed: 06/17/2023 5:39:37 PM By: [name] PA-C Entered By: [name] on 06/17/2023  17:39:37 -------------------------------------------------------------------------------- Physical Exam Details Patient Name: Date of Service: Chad [name], Chad [name] 06/17/2023 9:30 A M Medical Record Number: [id number] Patient Account Number: [id number] Date of Birth/Sex: Treating RN: [birth year] (62 y.o. [name] Primary Care Provider: [name] Other Clinician: Referring Provider: Treating Provider/Extender: [name] Weeks in Treatment: 66 Constitutional Well-nourished and well-hydrated in no acute distress. Respiratory normal breathing without difficulty. Psychiatric this patient is able to make decisions and demonstrates good insight into disease process. Alert and  Oriented x 3. pleasant and cooperative. Notes Upon inspection patient's wound bed actually showed signs still of having significant drainage unfortunately he does not seem to be doing a whole lot better in general with regard to the wounds overall and again I am concerned that he still continues to have significant problems here. With that being said if he is declining any further care from the standpoint of antibiotics especially by IV which is kind of why we want him to see infectious disease to begin with there is absolutely nothing that I would be able to do to help him at this point from the standpoint of infection where he is going to try to manage the wounds as best we can. Electronic Signature(s) Signed: 06/17/2023 5:40:29 PM By: [name] PA-C Entered By: [name] on 06/17/2023 17:40:28 [name] ([id number]) [id number] Page 7 of 17 -------------------------------------------------------------------------------- Physician Orders Details Patient Name: Date of Service: Chad [name], Chad [name] 06/17/2023 9:30 A M Medical Record Number: [id number] Patient Account Number: [id number] Date of Birth/Sex: Treating RN: [birth year] (62 y.o. [name] Primary Care  Provider: [name] Other Clinician: Referring Provider: Treating Provider/Extender: [name] in Treatment: 39 The following information was scribed by: Midge Aver The information was scribed for: [name] Verbal / Phone Orders: No Diagnosis Coding ICD-10 Coding Code Description [phone] Chronic venous hypertension (idiopathic) with ulcer and inflammation of left lower extremity 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 Nurse Visit as needed Bathing/ Shower/ Hygiene Clean wound with Normal Saline or wound cleanser. No tub bath. Anesthetic (Use 'Patient Medications' Section for Anesthetic Order Entry) Lidocaine applied to wound bed Off-Loading Heel suspension boot - when in bed Turn and reposition every 2 hours Medications-Please add to medication list. ntibiotics - RX renewed 05/25/2023 P.O. 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 Remove old dressing, discard into plastic bag and place into trash. Cleanse the wound with Wound Cleanser prior to applying a clean dressing using gauze sponges, not tissues or cotton balls. Do not scrub or use excessive force. Pat dry using gauze sponges, not tissue or cotton balls. Prim Dressing: Silvercel 4 1/4x 4 1/4 (in/in) 1 x Per Day/30 Days ary Discharge Instructions: Apply Silvercel 4 1/4x 4 1/4 (in/in) as  instructed Secondary Dressing: Zetuvit Plus 4x8 (in/in) 1 x Per Day/30 Days Secondary Dressing: Heel Cup 1 x Per Day/30 Days Secured With: Kerlix Roll Sterile or Non-Sterile 6-ply 4.5x4 (yd/yd) 1 x Per Day/30 Days Discharge Instructions: Apply Kerlix as directed Secured With: Tubigrip Size D, 3x10 (in/yd) 1 x Per Day/30 Days [name] ([id number]) [id number] Page 8 of 17 Wound #18 - T Second oe Wound Laterality: Right, Medial Cleanser: Soap and Water 1 x Per Day/30 Days Discharge Instructions: Gently cleanse wound with antibacterial soap, rinse and pat dry prior to dressing wounds Cleanser: Wound Cleanser 1 x Per Day/30 Days Discharge Instructions: Wash your hands with soap and water. Remove old dressing, discard  into plastic bag and place into trash. Cleanse the wound with Wound Cleanser prior to applying a clean dressing using gauze sponges, not tissues or cotton balls. Do not scrub or use excessive force. Pat dry using gauze sponges, not tissue or cotton balls. Secondary Dressing: Zetuvit Plus 4x8 (in/in) 1 x Per Day/30 Days Secured With: Kerlix Roll Sterile or Non-Sterile 6-ply 4.5x4 (yd/yd) 1 x Per Day/30 Days Discharge Instructions: Apply Kerlix as directed Secured With: Tubigrip Size D, 3x10 (in/yd) 1 x Per Day/30 Days Wound #21 - Foot Wound Laterality: Dorsal, Right Cleanser: Soap and Water 1 x Per Day/30 Days Discharge Instructions: Gently cleanse wound with antibacterial soap, rinse and pat dry prior to dressing wounds Cleanser: Wound Cleanser 1 x Per Day/30 Days Discharge Instructions: Wash your hands with soap and water. Remove old dressing, discard into plastic bag and place into trash. Cleanse the wound with Wound Cleanser prior to applying a clean dressing using gauze sponges, not tissues or cotton balls. Do not scrub or use excessive force. Pat dry using gauze sponges, not tissue or cotton balls. Prim Dressing: Silvercel 4 1/4x 4 1/4  (in/in) 1 x Per Day/30 Days ary Discharge Instructions: Apply Silvercel 4 1/4x 4 1/4 (in/in) as instructed Secondary Dressing: Zetuvit Plus 4x8 (in/in) 1 x Per Day/30 Days Secured With: Kerlix Roll Sterile or Non-Sterile 6-ply 4.5x4 (yd/yd) 1 x Per Day/30 Days Discharge Instructions: Apply Kerlix as directed Secured With: Tubigrip Size D, 3x10 (in/yd) 1 x Per Day/30 Days Wound #22 - Lower Leg Wound Laterality: Right, Anterior Cleanser: Soap and Water 1 x Per Day/30 Days Discharge Instructions: Gently cleanse wound with antibacterial soap, rinse and pat dry prior to dressing wounds Cleanser: Wound Cleanser 1 x Per Day/30 Days Discharge Instructions: Wash your hands with soap and water. Remove old dressing, discard into plastic bag and place into trash. Cleanse the wound with Wound Cleanser prior to applying a clean dressing using gauze sponges, not tissues or cotton balls. Do not scrub or use excessive force. Pat dry using gauze sponges, not tissue or cotton balls. Prim Dressing: Silvercel 4 1/4x 4 1/4 (in/in) 1 x Per Day/30 Days ary Discharge Instructions: Apply Silvercel 4 1/4x 4 1/4 (in/in) as instructed Secondary Dressing: Zetuvit Plus 4x8 (in/in) 1 x Per Day/30 Days Secured With: Kerlix Roll Sterile or Non-Sterile 6-ply 4.5x4 (yd/yd) 1 x Per Day/30 Days Discharge Instructions: Apply Kerlix as directed Secured With: Tubigrip Size D, 3x10 (in/yd) 1 x Per Day/30 Days Wound #23 - Lower Leg Wound Laterality: Right, Posterior Cleanser: Soap and Water 1 x Per Day/30 Days Discharge Instructions: Gently cleanse wound with antibacterial soap, rinse and pat dry prior to dressing wounds Cleanser: Wound Cleanser 1 x Per Day/30 Days Discharge Instructions: Wash your hands with soap and water. Remove old dressing, discard into plastic bag and place into trash. Cleanse the wound with Wound Cleanser prior to applying a clean dressing using gauze sponges, not tissues or cotton balls. Do not scrub or use  excessive force. Pat dry using gauze sponges, not tissue or cotton balls. Prim Dressing: Silvercel 4 1/4x 4 1/4 (in/in) 1 x Per Day/30 Days ary Discharge Instructions: Apply Silvercel 4 1/4x 4 1/4 (in/in) as instructed Secondary Dressing: Zetuvit Plus 4x8 (in/in) 1 x Per Day/30 Days Secured With: Kerlix Roll Sterile or Non-Sterile 6-ply 4.5x4 (yd/yd) 1 x Per Day/30 Days Discharge Instructions: Apply Kerlix as directed Secured With: Tubigrip Size D, 3x10 (in/yd) 1 x Per Day/30 Days Electronic Signature(s) Signed: 06/18/2023 1:29:03 PM By:  [name] [place], [place] ([id number]) [id number] Page 9 of 17 Signed: 06/19/2023 12:46:12 PM By: Midge Aver MSN RN CNS WTA Entered By: Midge Aver on 06/17/2023 13:57:42 -------------------------------------------------------------------------------- Problem List Details Patient Name: Date of Service: Chad [name], Chad [name] 06/17/2023 9:30 A M Medical Record Number: [id number] Patient Account Number: [id number] Date of Birth/Sex: Treating RN: 1961-05-21 (62 y.o. 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 02/19/2023 No Yes foot 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, stage 3 10/07/2022 No Yes Inactive Problems ICD-10 Code Description Active Date Inactive Date L97.322 Non-pressure chronic ulcer of left ankle with  fat layer exposed 12/06/2021 12/06/2021 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 [name] Primary Care Provider: [name] Other Clinician: Referring Provider: Treating Provider/Extender: [name] Weeks in Treatment: 80 Subjective Chief Complaint Information obtained from Patient Left ankle ulcer, Left dorsal foot wound, right heel History of Present Illness (HPI) 10/08/18 on evaluation today patient actually presents to our office for initial evaluation concerning wounds that he has of the bilateral lower extremities. 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 He also has bilateral venous stasis. He apparently has been homeless currently following discharge from the hospital apparently he has been placed at [facility] which is is a skilled nursing facility locally. Nonetheless fortunately he does not show any signs of infection at this time which is good news. In fact several of the wound actually appears to be showing some signs of improvement already in my pinion. There are a couple areas in the left leg in particular there likely gonna require some sharp debridement to help clear away some necrotic tissue and help  with more sufficient healing. No fevers, chills, nausea, or vomiting noted at this time. 10/15/18 on evaluation today patient actually appears to be doing very well in regard to his bilateral lower extremities. He's been tolerating the dressing changes without complication. Fortunately there does not appear to be any evidence of active infection at this time which is great news. Overall I'm actually very pleased with how this has progressed in just one visits time. Readmission: 08/14/2020 upon evaluation today patient presents for re-evaluation here in our clinic. He is having issues with his left ankle region as well as his right toe and his right heel. He tells me that the toe and heel actually began as a area that was itching that he was scratching and then subsequently opened up into wounds. These may have been  abscess areas I presume based on what I am seeing currently. With regard to his left ankle region he tells me this was a similar type occurrence although he does have venous stasis this very well may be more of a venous leg ulcer more than anything. Nonetheless I do believe that the patient would benefit from appropriate and aggressive wound care to try to help get things under better control here. He does have history of a stroke on the left side affecting him to some degree there that he is able to stand although he does have some residual weakness. Otherwise again the patient does have chronic venous insufficiency as previously noted. His arterial studies most recently obtained showed that he had an ABI on the right of 1.16 with a TBI of 0.52 and on the left and ABI of 1.14 with a TBI of 0.81. That was obtained on 06/19/2020. 08/28/2020 upon evaluation today patient appears to be doing decently well in regard to his wounds in general. He has been tolerating the dressing changes without complication. Fortunately there does not appear to be any signs of active infection which is great news.  With that being said I think the [facility] is doing a good job I would recommend that we likely continue with that currently. 09/11/2020 upon evaluation today patient's wounds did not appear to be doing too poorly but again he is not really showing signs of significant improvement with regard to any of the wounds on the right. None of them have Hydrofera Blue on them I am not exactly sure why this is not being followed as the facility did not contact us to let us know of any issues with obtaining dressings or otherwise. With that being said he is supposed to be using Hydrofera Blue on both of the wounds on the right foot as well as the ankle wound on the left side. 09/18/2020 upon evaluation today patient appears to be doing poorly with regard to his wounds. Again right now the left ankle in particular showed signs of extreme maceration. Apparently he was told by someone with staff at [facility] they could not get the [facility]. With that being said this is something that is never been relayed to [place] one way or another. Also the patient subsequently has not supposed to have a border gauze dressing on. He should have an ABD pad and roll gauze to secure as this drains much too much just to have a border gauze dressing to cover. Nonetheless the fact that they are not using the appropriate dressing is directly causing deterioration of the left ankle wound it is significantly worse today compared to what it was previous. I did attempt to call [facility] while the patient was here I called three times and got no one to even pick up the phone. After this I had my for an office coordinator call and she was able to finally get through and leave a message with the D ON as of dictation of this note which is roughly about an hour and a half later I still have not been able to speak with anyone at the facility. 09/25/2020 upon evaluation today patient actually showing signs of good  improvement which is excellent news. He has been tolerating the dressing changes without complication. Fortunately there is no signs of active infection which is great news. No fevers, chills, nausea, vomiting, or diarrhea. I do feel like the facility has been doing a much better job at  taking care of him as far as the dressings are concerned. However the director of nursing never did call me back. 10/09/2020 upon evaluation today patient appears to be doing well with regard to his wound. The toe ulcer did require some debridement but the other 2 areas actually appear to be doing quite well. 10/19/2020 upon evaluation today patient actually appears to be doing very well in regard to his wounds. In fact the heel does appear to be completely healed. The toe is doing better in the medial ankle on the left is also doing better. Overall I think he is headed in the right direction. 10/26/2020 upon evaluation today patient appears to be doing well with regard to his wound. He is showing signs of improvement which is great news and overall I am very pleased with where things stand today. No fevers, chills, nausea, vomiting, or diarrhea. 11/02/2020 upon evaluation today patient appears to be doing well with regard to his wounds. He has been tolerating the dressing changes without complication overall I am extremely pleased with where things stand today. He in regard to the toe is almost completely healed and the medial ankle on the left is doing much better. Chad [name], Chad [name] ([id number]) [id number] Page 11 of 17 11/09/2020 upon evaluation today patient appears to be doing a little poorly in regard to his left medial ankle ulcer. Fortunately there does not appear to be any signs of systemic infection but unfortunately locally he does appear to be infected in fact he has blue-green drainage consistent with Pseudomonas. 11/16/2020 upon evaluation today patient appears to be doing well with  regard to his wound. It actually appears to be doing better. I did place him on gentamicin cream since the Cipro was actually resistant even though he was positive for Pseudomonas on culture. Overall I think that he does seem to be doing better though I am unsure whether or not they have actually been putting the cream on. The patient is not sure that we did talk to the nurse directly and she was going to initiate that treatment. Fortunately there does not appear to be any signs of active infection at this time. No fevers, chills, nausea, vomiting, or diarrhea. 4/28; the area on the right second toe is close to healed. Left medial ankle required debridement 12/07/2020 upon evaluation today patient appears to be doing well with regard to his wounds. In fact the right second toe appears to be completely healed which is great news. Fortunately there does not appear to be any signs of active infection at this time which is also great news. I think we can probably discontinue the gentamicin on top of everything else. 12/14/2020 upon evaluation today patient appears to be doing well with regard to his wound. He is making good progress and overall very pleased with where things stand today. There is no signs of active infection at this time which is great news. 12/28/2020 upon evaluation today patient appears to be doing well with regard to his wounds. He has been tolerating the dressing changes without complication. Fortunately there is no signs of active infection at this time. No fevers, chills, nausea, vomiting, or diarrhea. 12/28/2020 upon evaluation today patient's wound bed actually showed signs of excellent improvement. He has great epithelization and granulation I do not see any signs of infection overall I am extremely pleased with where things stand at this point. No fevers, chills, nausea, vomiting, or diarrhea. 01/11/2021 upon evaluation today patient appears to be doing well with  regard to his wound  on his leg. He has been tolerating the dressing changes without complication. Fortunately there does not appear to be any signs of active infection which is great news. No fevers, chills, nausea, vomiting, or diarrhea. 01/25/2021 upon evaluation today patient appears to be doing well with regard to his wound. He has been tolerating the dressing changes without complication. Fortunately the collagen seems to be doing a great job which is excellent news. No fevers, chills, nausea, vomiting, or diarrhea. 02/08/2021 upon evaluation today patient's wound is actually looking a little bit worse especially in the periwound compared to previous. Fortunately there does not appear to be any signs of infection which is great news with that being said he does have some irritation around the periphery of the wound which has me more concerned. He actually had a dressing on that had not been changed in 3 days. He also is supposed to have daily dressing changes. With regard to the dressing applied he had a silver alginate dressing and silver collagen is what is recommended and ordered. He also had no Desitin around the edges of the wound in the periwound region although that is on the order inspect to be done as well. In general I was very concerned I did contact [facility] actually spoke with Verlon Au who is the scheduling individual and subsequently she stated that she would pass the information to the D ON apparently the D ON was not available to talk to me when I call today. 02/18/2021 upon evaluation today patient's wound is actually showing signs of improvement. Fortunately there does not appear to be any evidence of infection which is great news overall I am extremely pleased with where things stand today. No fevers, chills, nausea, vomiting, or diarrhea. 8/3; patient presents for 1 week follow-up. He has no issues or complaints today. He denies signs of infection. 03/11/2021 upon evaluation today patient  appears to be doing well with regard to his wound. He does have a little bit of slough noted on the surface of the wound but fortunately there does not appear to be any signs of active infection at this time. No fevers, chills, nausea, vomiting, or diarrhea. 03/18/2021 upon evaluation today patient appears to be doing well with regard to his wound. He has been tolerating the dressing changes without complication. There was a little irritation more proximal to where the wound was that was not noted last week but nonetheless this is very superficial just seems to be more irritation we just need to make sure to put a good amount of the zinc over the area in my opinion. Otherwise he does not seem to be doing significantly worse at all which is great news. 03/25/2021 upon evaluation today patient appears to be doing well with regard to his wound. He is going require some sharp debridement today to clear with some of the necrotic debris. I did perform this today without complication postdebridement wound bed appears to be doing much better this is great news. 04/08/2021 upon evaluation today patient appears to be doing decently well in regard to his wound although the overall measurement is not significantly smaller compared to previous. It is gone down a little bit but still the facility continues to not really put the appropriate dressings in place in fact he was supposed to have collagen we think he probably had more of an allergy to At this point. Fortunately there does not appear to be any signs of active infection systemically  though locally I do not see anything on initial visualization either as far as erythema or warmth. 04/15/2021 upon evaluation today patient appears to be doing well with regard to his wound. He is actually showing signs of improvement. I did place him on antibiotics last week, Cipro. He has been taking that 2 times a day and seems to be tolerating it very well. I do not see any  evidence of worsening and in fact the overall appearance of the wound is smaller today which is also great news. 9/26; left medial ankle chronic venous insufficiency wound is improved. Using Hydrofera Blue 10/10; left medial ankle chronic venous insufficiency. Wound has not changed much in appearance completely nonviable surface. Apparently there have been problems getting the right product on the wound at the facility although he came in with [facility] on today 05/14/2021 upon evaluation today patient appears to be doing well with regard to his wound. I think he is making progress here which is good news. Fortunately there does not appear to be any signs of active infection at this time. No fevers, chills, nausea, vomiting, or diarrhea. 05/20/2021 upon evaluation today patient appears to be doing well with regard to his wound. He is showing signs of good improvement which is great news. There does not appear to be any evidence of active infection which is also excellent news. No fevers, chills, nausea, vomiting, or diarrhea. 05/28/2021 upon evaluation today patient appears to be doing quite well. There does not appear to be any signs of active infection at this time which is great news. Overall I am extremely pleased with where things stand today. I think he is headed in the right direction. 06/11/2021 upon evaluation today patient appears to be doing well with regard to his left ankle ulcer and poorly in regard to the toe ulcer on the second toe right foot. This appears to show signs of joint exposure. Apparently this has been present for 1 to 2 months although he kept forgetting to tell me about it. That is unfortunate as right now it definitely appears to be doing significantly worse than what I would like to see. There does not appear to be any signs of active infection systemically though locally I am concerned about the possibility of infection the toe is quite red. Again no one from the  facility ever contacted [place] to advise that this was going on in the interim either. 06/17/2021 upon evaluation today patient presents for follow-up I did review his x-ray which showed a navicular bone fracture I am unsure of the chronicity of this. Subsequently he also had osteomyelitis of the toe which was what I was more concerned about this did not show up on x-ray but did show up on the pathology scrapings. This was listed as acute osteomyelitis. Nonetheless at this point I think that the antibiotic treatment is the best regimen to go with currently. The patient is in agreement with that plan. Nonetheless he has initially 30 days of doxycycline off likely extend that towards the end of the treatment cycle that will be around the middle of December for an additional 2 weeks. That all depends on how well he continues to heal. Nonetheless based on what I am seeing in the foot I did want a proceed with an MRI as well which I think will be helpful to identify if there is anything else that needs to be addressed from the standpoint of infection. 06/24/2021 upon evaluation today patient appears to be  doing pretty well in regards to his wounds. I think both are actually showing signs of improvement which is good I did review his MRI today which did show signs of osteomyelitis of the middle and proximal phalanx on his right foot of the affected toe. With that being said this is actually showing signs of significant improvement today already with the antibiotic therapy I think the redness is also improved. Overall I Chad [name], Chad [name] ([id number]) [id number] Page 12 of 17 think that we just need to give this some time with appropriate wound care we will see how things go potentially hyperbarics could be considered. 07/02/2021 upon inspection today patient actually appears to be doing well in regard to his left ankle which is getting very close to complete resolution of pleased in  that regard. Unfortunately he is continuing to have issues with his second toe right foot and this seems to still be very painful for him. Recommend he try something different from the standpoint of antibiotics. 07/15/2021 upon evaluation today patient appears to be doing actually pretty well in regard to his foot. This is actually showing signs of significant improvement which is great news. Overall I feel like the patient is improving both in regard to the second toe as well as the ankle on the left. With that being said the biggest issue that I do see currently is that he is needing to have a refill of the doxycycline that we previously treated him with. He also did see podiatry they are not going to recommend any amputation at this point since he seems to be doing quite well. For that reason we just need to keep things under control from an infection standpoint. 08/01/2021 upon evaluation today patient appears to be doing well with regard to his wound. He has been tolerating the dressing changes without complication. Fortunately there does not appear to be any evidence of active infection locally nor systemically at this point. In fact I think everything is doing excellent in fact his second toe on the right foot is almost healed and the ankle on the left ankle region is actually very close to being healed as well. 08/08/2021 upon evaluation today patient appears to be doing well with regard to his wound. He has been tolerating the dressing changes without complication. Fortunately I do not see any signs of active infection at this time. Readmission: 12-06-2021 upon evaluation today patient presents for reevaluation here in the clinic he does tell me that he was being seen in facility at [facility] by a provider that was coming in. He is not sure who this was. He tells me however that the wound seems to have gotten worse even compared to where it was when we last saw him at this point. With  that being said I do believe that he is likely going need ongoing wound care here in the clinic and I do believe that we need to be the ones to frontline this since his wound does seem to be getting worse not better at this point. He voiced understanding. He is also in agreement with this plan and feels more comfortable coming here she tells me. Patient's medical history really has not changed since his prior admission he was only gone since January. 12-27-2021 upon evaluation today patient appears to be doing well with regard to his wound they did run out of the [facility] so they did not put anything on just an ABD pad with gentamicin. Still we are seeing  some signs of good improvement here with some new epithelization which is great news. 01-10-2022 upon evaluation today patient appears to be doing well with regard to his wounds and he is going require some sharp debridement but overall seems to be making good progress. Fortunately I do not see any evidence of active infection locally or systemically at this time which is great news. 01-24-2022 upon evaluation today patient appears to be doing well with regard to his wound. The facility actually came and dropped him off early and he had another appointment at the hospital and then they just brought him over here and this was still hours before his appointment this afternoon. For that reason we did do our best to work him in this morning and fortunately had some space to make this happen. With that being said patient's wound does seem to be making progress here and I am very pleased in that regard I do not see any signs of active infection locally or systemically at this time. 02-07-2022 patient appears to be doing well currently in regard to his wounds. In fact one of them the more proximal is healed the distal is still open but seems to be doing excellent. Fortunately I do not see any evidence of active infection locally or systemically at this time  which is great news. No fevers, chills, nausea, vomiting, or diarrhea. 7/27; left medial ankle venous. Improving per our intake nurse. We are using Hydrofera Blue under Tubigrip compression. They are changing that at his facility 03-06-2022 upon evaluation today patient appears to be doing well with regard to the his wound he is can require some sharp debridement but seems to be making excellent progress. Fortunately I do not see any evidence of active infection locally or systemically which is great news. 03-27-2022 upon evaluation today patient's wound is actually showing signs of excellent improvement. Fortunately I see no signs of active infection locally or systemically at this time which is great news. No fevers, chills, nausea, vomiting, or diarrhea. 04-21-2022 upon evaluation today patient appears to be doing excellent in regard to his wound in fact this is very close to resolution based on what I am seeing. I do not see any evidence of active infection locally or systemically at this time which is great news and overall I am extremely pleased with where we are today. 05-05-2022 upon evaluation today patient's wound actually appears to potentially be completely healed. Fortunately I do not see any evidence of active infection at this time which is great news and overall I am very pleased I think this needs a little time to toughen up but other than that I really do believe were doing quite well. He is very pleased to hear this its been a long time coming. 05-19-2022 upon evaluation today patient appears to be doing well currently in regard to his wound. He in fact we were hoping will be completely healed and closed but it still has a very tiny area right in the center which is still continuing to drain. Fortunately I do not see any signs of infection locally or systemically which is great news. 11/6; this wound is close to completely" he comes in this week with some erythema at 1 edge of this  which looks like something was rubbing on the area. Other than that no evidence of infection 06-16-2022 upon evaluation today patient appears to be doing well currently in regard to his ankle ulcer. This is very close to being healed but  still has a small area in the central portion which is not. Fortunately there does not appear to be any signs of infection locally or systemically which is great news. No fevers, chills, nausea, vomiting, or diarrhea. 06-30-2022 upon evaluation patient's wound pretty much appears to be almost completely closed. In fact it may even be closed but there are still a lot of skin irritation around the edges of the wound and I just do not feel great about releasing them yet I would like to continue to monitor this just a little bit longer before getting to that point. He is not opposed to this and in fact is happy to continue to come in if need be in order to make sure that things are moving in the right direction he definitely does not want to backtrack. 07-14-2022 upon evaluation today patient appears to be doing well currently in regard to his wound. Has been tolerating the dressing changes without complication. Fortunately I see no evidence of active infection locally nor systemically which is great news. No fevers, chills, nausea, vomiting, or diarrhea. 08-11-2022 upon evaluation today patient appears to be doing a little worse than last time I saw him although it has been a month. Unfortunately he was not able to be seen due to the fact that he was quarantined in the facility with COVID. Subsequently he tells me that he mention to the facility staff several times about taking it off over the past several weeks but they continue to tell him that someone have to get in touch with [place] here although nobody ever did until Friday when the nurse manager called to have that documented in the communication notes. With that being said unfortunately this means that he had a wrap on  that he was supposed to have on for only 1 week for ended up being on four 1 month essentially. I am glad that there is nothing worse than what we see currently to be perfectly honest. 08-19-2022 upon evaluation today patient appears to be doing well currently in regard to his wounds. He has been tolerating the dressing changes without complication. This is actually smaller today which is great news after last week's reopening. Fortunately I do not see any evidence of infection locally nor systemically at this point. 08-26-2022 upon evaluation today patient appears to be doing well currently in regard to his wound. He has been tolerating the dressing changes without complication. Fortunately there does not appear to be any signs of active infection locally nor systemically which is great news. No fevers, chills, nausea, Chad [name], Chad [name] ([id number]) [id number] Page 13 of 17 vomiting, or diarrhea. 09-02-2022 upon evaluation today patient's wound actually showing signs of excellent improvement this is measuring smaller and looking much better. I am very pleased with where we stand I do believe that we are headed in the right direction. 09-09-2022 upon evaluation today patient appears to be doing decently well in regard to his leg in general although there was some swelling this is the 1 thing that I am not too happy with based on what I see currently. Fortunately there does not appear to be any signs of infection locally nor systemically at this point. 09-16-2022 upon evaluation today patient appears to be doing well currently in regard to his wound. He is actually showing signs of improvement he wore the Tubigrip this week and it significantly better compared to last week's evaluation. Fortunately I do not see any evidence of active infection locally or systemically which  is great news. Overall I think that were headed in the right direction which is great news. In fact overall I  think this is the best that he has looked in some time. He does not like the Tubigrip but actually did extremely well with this in my opinion. I think he needs to continue to utilize this. 09-23-2022 upon evaluation today patient appears to be doing well currently in regard to his wound. He has been tolerating the dressing changes without complication. Fortunately there does not appear to be any signs of active infection locally nor systemically which is great news and in general I do feel like that we are headed in the right direction. He has been having trouble with the Tubigrip however we have gotten the facility to order compression socks he has not gotten them as of yet. 3/4; patient is about the same with a wound on the left posterior leg. This is a venous wound. He needs more compression on this leg but he took off the previous wraps we are using. I am not certain whether he is using juxta lite stockings at the nursing home. 3/12; patient presents for follow-up. He unfortunately has 2 new wounds 1 to the dorsal left side and the other to the right heel. He is not sure how these happened. The right heel appears to be caused by pressure. He has been using Hydrofera Blue to the original wound on the left leg. 10-14-2022 upon evaluation today patient appears to be doing well currently in regard to his original wound but unfortunately he has several new wounds which I was completely unaware of before walking into the room to see him today. Unfortunately he seems to not be doing nearly as well as what I saw when I last had an appointment with him 3 weeks ago. Fortunately I do not see any signs of systemic infection though unfortunately locally he has definite infection of the left lower extremity. He is currently on doxycycline I am going to likely have to extend that today. 10-21-2022 upon evaluation today patient appears to be doing well currently in regard to his wounds. He seems to be tolerating the  dressing changes without complication. Fortunately there does not appear to be any signs of active infection locally or systemically which is great news and in general I definitely feel like we are on the right track here. 10-30-2022 upon evaluation today patient still continues to not be doing too well at all with regard to his wounds. Unfortunately he is still having signs of infection as well which also has been concerned. Fortunately I do not see any signs of active infection locally nor systemically at this time. No fevers, chills, nausea, vomiting, or diarrhea. 11-06-2022 upon evaluation today patient appears to be doing okay currently in regard to his wounds. All things considered with his blood flow not being nearly as good as what should I think that he is making pretty good progress here. Fortunately I do not see any signs of infection locally nor systemically which is great news. No fevers, chills, nausea, vomiting, or diarrhea. 11-13-2022 upon evaluation today patient appears to be doing poorly currently in regard to his wounds. Things seem to be getting a little bit worse not better. 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 Subsequently he is also going to need to have vascular intervention but we are unsure  exactly when this is going on the undertaking. I would try to actually send a message to Dr. Hazle Quant and the vascular team just to see what exactly the plan is going forward. The patient is in agreement with that plan I just think we need to have a plan especially in light of the fact the left leg seems to be getting a little bit worse at this point. 11-25-2022 upon evaluation today patient is currently now discharged from the hospital following his colostomy procedure. That did very well and he seems to be doing well. Fortunately there does not appear to be any signs of active infection  locally or systemically which is great news. No fevers, chills, nausea, vomiting, or diarrhea. 12-04-2022 upon evaluation today patient appears to be doing actually decently well in regard to his wounds and actually very pleased with where we stand and I think that he is making some pretty good progress here. Fortunately I do not see any signs of active infection locally nor systemically which is great news. No fevers, chills, nausea, vomiting, or diarrhea. 12-12-2022 upon evaluation today patient appears to be doing well currently in regard to his wound. He has been tolerating the dressing changes without complication. The wounds all seem to be making some progress to a degree. He has appointment with vascular on the 23rd of this month. 5/24; the patient was scheduled for a left lower extremity angiogram yesterday but apparently they were overbooked and they wanted to admit him to hospital last night so they could do this early this morning but he refused I see that Dr. Wyn Quaker has rescheduled him for June 6. He is using silver alginate to wounds on his bilateral lower extremities. 01-02-2023 upon evaluation today patient appears to be doing poorly in regard to his wounds still. Subsequently he did have his angiogram but apparently there really was not much that was found that could be revascularized. The patient tells me that Dr. Wyn Quaker was going to contact me here in the office I have not heard from him as of yet and I may see about giving him a call just to follow-up or possibly send a message through epic which could be an option as well. 01-09-2023 upon evaluation today patient appears to be doing poorly in regard to his legs he is extremely swollen. He has been sleeping in his chair he tells me for the past week. His bed is broken and they have ordered a new one which is supposedly supposed to be there today. With that being said he the way he needs to be not sleeping in his chair that is his wheelchair  every day and through the night. This is causing his legs to be much more swollen and it seen and how badly wounds are doing at this point. This has made things significantly worse. Fortunately I do not see any signs of infection right now unfortunately I do believe that the patient has a tremendous amount of drainage currently. 01-19-2023 upon evaluation today patient appears to be doing poorly in regard to his wounds of his legs his swelling is better but still not as good as what we would like to see. Fortunately I do not see any evidence of active infection locally nor systemically which is great news and in general I do believe that we are moving in the right direction here. Nonetheless it would be better if she was in the bed sleeping or not send up in his wheelchair although he  seems to still be set up in the wheelchair at this time. He tells me that he is just not as comfortable in the bed as the wheelchair but unfortunately this is causing damage to his legs which I discussed with him today as well. Nonetheless I am not sure that he is going to be amendable to doing anything different. 01-27-2023 upon evaluation today patient appears to be doing well currently. Fortunately I do not see any evidence of active infection locally nor systemically which is great news I think he is actually doing the best option and while in fact I would try to see if we can do some debridement today to get things moving along. 02-03-2023 upon evaluation today patient's wounds unfortunately are not doing nearly as well. Will be doing the [employer] wraps unfortunately we just not seeing the improvement that I would like to see. Fortunately I do not see any evidence of infection at this time which is good news unfortunately I do think that he needs to be changed on a daily basis due to the amount of drainage otherwise I do not think this is really going to continue to improve dramatically. 02-12-2023 upon evaluation today  patient appears to be doing better in a lot of ways in regard to his wounds and although his toes on the left side actually seem a bit worse. Fortunately I do not see any evidence of active infection locally nor systemically at this time which is good news. With that being said he is having some bleeding from the left leg I think that this is an area that looks like it probably got bumped. With that being said I discussed this with the patient Chad [name], Chad [name] ([id number]) [id number] Page 14 of 17 he tells me he does not know of any trauma that occurred but at the same time if was not this that it was skin that got pulled off. Dressing change there is only one of the 2 that could have happened. 7/25; this is a patient who is very frail at [facility] nursing facility. He is nonambulatory. He has wounds on his bilateral lower legs. However much worse on the left we have been using silver cell to all of his wounds He comes in today and a lot more pain our intake nurse reports deterioration of the left foot. I ended up looking through some of his record. 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 I am really not certain of the stage of this currently or who is following 02-26-2023 upon evaluation today patient unfortunately appears to be doing significantly worse compared to where he was previous. The left foot is showing signs of dramatic worsening in regard to the overall appearance his  toes are starting to become necrotic almost in full and he has much more cyanosis present than what I even seen previous. He is already seeing Dr. Wyn Quaker and unfortunately there was not anything that they saw that was an immediate cause for this but at the same time I feel like this may have changed  and even worsened I feel like this could be a vascular event and I believe this is a limb-threatening situation. 03/26/2023; I last saw this patient on 8/1. Wondered about below-knee amputations versus palliative care. 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 The wound has healed fairly well and he follows up with surgery today. He is back today looking at wounds on the right foot and lower leg. This includes 2 areas on the right heel, right dorsal foot right dorsal first and second toes and a small area on the right anterior lower tibia area. He has a lot of pain in this leg as well. The angiogram he had on 01/01/2023 specific to the right leg now demonstrated a fairly normal common femoral artery, profunda femoris artery and superficial and popliteal arteries without significant disease. 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 No doubt this has a lot to do with the discomfort in the right leg he describes when he is in bed. They have been using Xeroform on the wounds on the right. He has a lot of swelling in this leg with weeping edema 04-09-2023 upon evaluation today patient appears to be doing poorly still in regard to his right leg. 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 Fortunately I do not see any signs of active infection locally or systemically at this time which is great news. No fevers, chills, nausea, vomiting, or diarrhea. With that being said I am concerned still about the fact that he seems to be getting worse  especially in regard to the heels I think that he is still sitting up most of the day which I think is still causing some swelling of his leg unfortunately. 04-16-2023 upon evaluation today patient actually appears to be doing little bit more poorly compared to last week this actually does appear to be infected. Last week I really did not think that was the case but this seems like it may indeed be the case. Fortunately I do not see any signs of active infection locally or systemically at this time. 04-24-2023 upon evaluation today patient appears to be doing well currently in regard to his leg compared to where we were previous I do feel like that the antibiotics have been of benefit. Fortunately I do not see any signs of active infection at this time which is great news and in general I believe that we are making headway towards healing which is great news as well. With that being said he still has a lot of drainage I really feel like something like Anola Gurney would be better for him than just an ABD pad it would likely the drainage more or even Zetuvit's but I am not sure whether we can get either 1 of these at the facility for him. 05-01-2023 upon evaluation today patient appears to be doing well currently in regard to his wounds which are showing signs of significant improvement is not any way shape or form infected like it was last time I saw him. I think the antibiotics are doing a good job but I think he may need an extension here to ensure that it completely clears. 05-08-2023 upon evaluation today patient appears to be doing well currently in regard to his wounds for the most part. He still has blue-green drainage noted but in general seems to  be doing much better compared to where we were previous. Fortunately I do not see any signs of active infection locally or systemically which is great news and in general I do believe that we are making good headway towards complete closure. 05-18-2023  upon evaluation today patient appears to be doing well currently in regard to his wounds I feel like he is definitely making some improvements here. This is a slow improvement but nonetheless improvement. I do believe that he is doing quite well as far as infection is concerned which is also good news. 05-25-2023 upon evaluation today patient actually appears to be doing worse in regard to his legs at this point. Unfortunately he continues to have issues with significant drainage which is the primary concern here. I do feel like that the wound currently is not doing a whole lot better when he was on the Levaquin I felt like it was improving some but the concern there is QT prolongation I do not want to continue that personally further I think he needs a referral ASAP to infectious disease. I discussed that with him today. He also needs to be having this changed every day and that has not been happening at this point. That is my biggest concern here. 06-09-2023 upon evaluation today patient appears to be doing still poorly in regard to this right leg which is draining profusely. This is unfortunately just continuing to show signs of being very moist and wet and weeping quite significantly. This is despite everything that we are doing including the facility have been placed him on Levaquin by ID which unfortunately just does not seem to be helping to heal this as well as we would hope either. I do have an infectious disease appointment that we sent 10 for him and that is actually scheduled for next week on the 19th. 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 06-17-2023 upon evaluation today patient appears to be doing still poorly in regard to his legs at this point. Unfortunately he still seems to be having issues with significant drainage we are having this changed every single day. With that being said  despite this the patient just does not seem to be making a lot of headway here towards closure. We are trying to do what we can from the standpoint of compression as well. He did see infectious disease yesterday. I did review that note as well. Upon evaluation the note actually states that the patient told the physician "do I have to keep suffering with wounds forevero Is there a way to get rid of these woundso However he did not want a referral to see Dr. Lajoyce Corners for potential amputation which seems to be 1 option that was proposed. With that being said he also apparently told her that he was "adamant about not doing another course of antibiotics as he feels like they have not healed his wounds". It appears based on evaluation with regard to the record that the patient did have a discussion with her about the possibility of doing a short course of IV cefepime for 14 days to cover Serratia as well as the other bacteria noted on culture. However the patient declined this as he felt like the antibiotics in the past he had taken did not help at all and there were no signs and symptoms to suggest sepsis. Therefore she recommended continuing wound care  and to continue to follow-up with vascular follow-up in 2 weeks with her. I appreciate this evaluation and again I cannot do anything with the fact that the patient did not want to proceed with any further treatment with regard to the bacterial infection. Chad [name], Chad [name] ([id number]) [id number] Page 15 of 17 Objective Constitutional Well-nourished and well-hydrated in no acute distress. Vitals Time Taken: 9:43 AM, Height: 69 in, Weight: 150 lbs, BMI: 22.1, Temperature: 98.3 F, Pulse: 90 bpm, Respiratory Rate: 18 breaths/min, Blood Pressure: 111/92 mmHg. Respiratory normal breathing without difficulty. Psychiatric this patient is able to make decisions and demonstrates good insight into disease process. Alert and Oriented x 3.  pleasant and cooperative. General Notes: Upon inspection patient's wound bed actually showed signs still of having significant drainage unfortunately he does not seem to be doing a whole lot better in general with regard to the wounds overall and again I am concerned that he still continues to have significant problems here. With that being said if he is declining any further care from the standpoint of antibiotics especially by IV which is kind of why we want him to see infectious disease to begin with there is absolutely nothing that I would be able to do to help him at this point from the standpoint of infection where he is going to try to manage the wounds as best we can. Integumentary (Hair, Skin) Wound #16 status is Open. Original cause of wound was Gradually Appeared. The date acquired was: 09/26/2022. The wound has been in treatment 36 weeks. The wound is located on the Right Calcaneus. The wound measures 7cm length x 9cm width x 0.2cm depth; 49.48cm^2 area and 9.896cm^3 volume. There is a medium amount of serosanguineous drainage noted. Wound #18 status is Open. Original cause of wound was Pressure Injury. The date acquired was: 09/26/2022. The wound has been in treatment 35 weeks. The wound is located on the Right,Medial T Second. The wound measures 2.5cm length x 1.8cm width x 0.1cm depth; 3.534cm^2 area and 0.353cm^3 volume. oe There is a medium amount of serosanguineous drainage noted. Wound #21 status is Open. Original cause of wound was Pressure Injury. The date acquired was: 03/02/2023. The wound has been in treatment 11 weeks. The wound is located on the Right,Dorsal Foot. The wound measures 4.5cm length x 4.5cm width x 0.2cm depth; 15.904cm^2 area and 3.181cm^3 volume. There is a medium amount of serous drainage noted. Wound #22 status is Open. Original cause of wound was Pressure Injury. The date acquired was: 03/02/2023. The wound has been in treatment 11 weeks. The wound is located on  the Right,Anterior Lower Leg. The wound measures 14cm length x 16.5cm width x 0.1cm depth; 181.427cm^2 area and 18.143cm^3 volume. There is a medium amount of serous drainage noted. Wound #23 status is Open. Original cause of wound was Pressure Injury. The date acquired was: 05/02/2023. The wound has been in treatment 5 weeks. The wound is located on the Right,Posterior Lower Leg. The wound measures 6.5cm length x 5cm width x 0.1cm depth; 25.525cm^2 area and 2.553cm^3 volume. There is a medium amount of serosanguineous drainage noted. 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 Nurse Visit as needed Bathing/ Shower/ Hygiene: Clean wound with Normal Saline or wound cleanser. No tub bath. Anesthetic (Use 'Patient Medications' Section for Anesthetic Order Entry): Lidocaine applied to wound bed Off-Loading: Heel suspension boot - when in bed Turn and reposition every 2 hours Medications-Please add to medication list.: P.O. Antibiotics - RX renewed 05/25/2023 WOUND #16: - Calcaneus Wound Laterality: Right Cleanser: Soap and Water 1 x Per Day/30 Days Discharge Instructions: Gently cleanse wound with antibacterial soap, rinse and pat dry prior to dressing wounds Cleanser: Wound Cleanser 1 x Per Day/30 Days Discharge Instructions: Wash your hands with soap and water. Remove old dressing, discard into plastic bag and place into trash. Cleanse the wound with Wound Cleanser prior to applying a clean dressing using gauze sponges, not tissues or cotton balls. Do not scrub or use  excessive force. Pat dry using gauze sponges, not tissue or cotton balls. Prim Dressing: Silvercel 4 1/4x 4 1/4 (in/in) 1 x Per Day/30 Days ary Discharge Instructions: Apply Silvercel 4 1/4x 4 1/4 (in/in) as instructed Secondary Dressing: Zetuvit Plus 4x8 (in/in) 1 x Per Day/30 Days Secondary Dressing: Heel Cup 1 x Per Day/30 Days BRYANN, DEGENHARDT ([id number]) [id number] Page 16 of 17 Secured With: [employer] or Non-Sterile 6-ply 4.5x4 (yd/yd) 1 x Per Day/30 Days Discharge Instructions: Apply Kerlix as directed Secured With: Tubigrip Size D, 3x10 (in/yd) 1 x Per Day/30 Days WOUND #18: - T Second Wound Laterality: Right, Medial oe Cleanser: Soap and Water 1 x Per Day/30 Days Discharge Instructions: Gently cleanse wound with antibacterial soap, rinse and pat dry prior to dressing wounds Cleanser: Wound Cleanser 1 x Per Day/30 Days Discharge Instructions: Wash your hands with soap and water. Remove old dressing, discard into plastic bag and place into trash. Cleanse the wound with Wound Cleanser prior to applying a clean dressing using gauze sponges, not tissues or cotton balls. Do not scrub or use excessive force. Pat dry using gauze sponges, not tissue or cotton balls. Secondary Dressing: Zetuvit Plus 4x8 (in/in) 1 x Per Day/30 Days Secured With: Kerlix Roll Sterile or Non-Sterile 6-ply 4.5x4 (yd/yd) 1 x Per Day/30 Days Discharge Instructions: Apply Kerlix as directed Secured With: Tubigrip Size D, 3x10 (in/yd) 1 x Per Day/30 Days WOUND #21: - Foot Wound Laterality: Dorsal, Right Cleanser: Soap and Water 1 x Per Day/30 Days Discharge Instructions: Gently cleanse wound with antibacterial soap, rinse and pat dry prior to dressing wounds Cleanser: Wound Cleanser 1 x Per Day/30 Days Discharge Instructions: Wash your hands with soap and water. Remove old dressing, discard into plastic bag and place into trash. Cleanse the wound with Wound Cleanser prior to  applying a clean dressing using gauze sponges, not tissues or cotton balls. Do not scrub or use excessive force. Pat dry using gauze sponges, not tissue or cotton balls. Prim Dressing: Silvercel 4 1/4x 4 1/4 (in/in) 1 x Per Day/30 Days ary Discharge Instructions: Apply Silvercel 4 1/4x 4 1/4 (in/in) as instructed Secondary Dressing: Zetuvit Plus 4x8 (in/in) 1 x Per Day/30 Days Secured With: Kerlix Roll Sterile or Non-Sterile 6-ply 4.5x4 (yd/yd) 1 x Per Day/30 Days Discharge Instructions: Apply Kerlix as directed Secured With: Tubigrip Size D, 3x10 (in/yd) 1 x Per Day/30 Days WOUND #22: - Lower Leg Wound Laterality: Right, Anterior Cleanser: Soap and Water 1 x Per Day/30 Days Discharge Instructions: Gently cleanse wound with antibacterial soap, rinse and pat dry prior to dressing wounds Cleanser: Wound Cleanser 1 x Per Day/30 Days Discharge  Instructions: Wash your hands with soap and water. Remove old dressing, discard into plastic bag and place into trash. Cleanse the wound with Wound Cleanser prior to applying a clean dressing using gauze sponges, not tissues or cotton balls. Do not scrub or use excessive force. Pat dry using gauze sponges, not tissue or cotton balls. Prim Dressing: Silvercel 4 1/4x 4 1/4 (in/in) 1 x Per Day/30 Days ary Discharge Instructions: Apply Silvercel 4 1/4x 4 1/4 (in/in) as instructed Secondary Dressing: Zetuvit Plus 4x8 (in/in) 1 x Per Day/30 Days Secured With: Kerlix Roll Sterile or Non-Sterile 6-ply 4.5x4 (yd/yd) 1 x Per Day/30 Days Discharge Instructions: Apply Kerlix as directed Secured With: Tubigrip Size D, 3x10 (in/yd) 1 x Per Day/30 Days WOUND #23: - Lower Leg Wound Laterality: Right, Posterior Cleanser: Soap and Water 1 x Per Day/30 Days Discharge Instructions: Gently cleanse wound with antibacterial soap, rinse and pat dry prior to dressing wounds Cleanser: Wound Cleanser 1 x Per Day/30 Days Discharge Instructions: Wash your hands with soap and water.  Remove old dressing, discard into plastic bag and place into trash. Cleanse the wound with Wound Cleanser prior to applying a clean dressing using gauze sponges, not tissues or cotton balls. Do not scrub or use excessive force. Pat dry using gauze sponges, not tissue or cotton balls. Prim Dressing: Silvercel 4 1/4x 4 1/4 (in/in) 1 x Per Day/30 Days ary Discharge Instructions: Apply Silvercel 4 1/4x 4 1/4 (in/in) as instructed Secondary Dressing: Zetuvit Plus 4x8 (in/in) 1 x Per Day/30 Days Secured With: Kerlix Roll Sterile or Non-Sterile 6-ply 4.5x4 (yd/yd) 1 x Per Day/30 Days Discharge Instructions: Apply Kerlix as directed Secured With: Tubigrip Size D, 3x10 (in/yd) 1 x Per Day/30 Days 1. Based on what I am seeing I would recommend that we continue with the silver alginate along with XtraSorb at this point I think this still probably his best option especially in light of the fact that the patient did not want any additional antibiotic therapy which is why we had sent him to infectious disease but nonetheless he declined this. 2. I am going to recommend as well that the patient should continue to elevate his legs as well as use compression. 3. I am also going to recommend that the Tubigrip be continued I do think that having him change this daily is still of utmost importance he is otherwise very prone to breaking down as far as maceration is concerned. We will see patient back for reevaluation in 1 week here in the clinic. If anything worsens or changes patient will contact our office for additional recommendations. Electronic Signature(s) Signed: 06/17/2023 5:41:15 PM By: [name] PA-C Entered By: [name] on 06/17/2023 17:41:15 SuperBill Details -------------------------------------------------------------------------------- [name] ([id number]) [id number] Page 17 of 17 Patient Name: Date of Service: Chad [name], Chad [name] 06/17/2023 Medical Record  Number: [id number] Patient Account Number: [id number] Date of Birth/Sex: Treating RN: [birth year] (62 y.o. [name] Primary Care Provider: [name] Other Clinician: Referring Provider: Treating Provider/Extender: [name] Weeks in Treatment: 79 Diagnosis Coding ICD-10 Codes Code Description [phone] Chronic venous hypertension (idiopathic) with ulcer and inflammation of left lower extremity 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, stage 3 Facility Procedures CPT4 Code Description Modifier Quantity [id number] [phone] -  WOUND CARE VISIT-LEV 5 EST PT 1 Physician Procedures Quantity CPT4 Code Description Modifier [id number] - WC PHYS LEVEL 4 - EST PT 1 ICD-10 Diagnosis Description 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 (idiopathic) with ulcer and inflammation of left lower extremity 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 Electronic Signature(s) Signed: 06/17/2023 5:41:56 PM By: [name] PA-C Previous Signature: 06/17/2023 1:59:00 PM Version By: Midge Aver MSN RN CNS WTA Entered By: [name] on 06/17/2023 17:41:56

## 2023-06-20 ENCOUNTER — Encounter: Payer: Self-pay | Admitting: Internal Medicine

## 2023-06-20 LAB — CBC
HCT: 33.5 % — ABNORMAL LOW (ref 38.5–50.0)
Hemoglobin: 10.5 g/dL — ABNORMAL LOW (ref 13.2–17.1)
MCH: 25.5 pg — ABNORMAL LOW (ref 27.0–33.0)
MCHC: 31.3 g/dL — ABNORMAL LOW (ref 32.0–36.0)
MCV: 81.5 fL (ref 80.0–100.0)
MPV: 10.8 fL (ref 7.5–12.5)
Platelets: 325 10*3/uL (ref 140–400)
RBC: 4.11 10*6/uL — ABNORMAL LOW (ref 4.20–5.80)
RDW: 14.1 % (ref 11.0–15.0)
WBC: 8.3 10*3/uL (ref 3.8–10.8)

## 2023-06-20 LAB — C-REACTIVE PROTEIN: CRP: 54.1 mg/L — ABNORMAL HIGH (ref <8.0)

## 2023-06-20 LAB — HEPATITIS C RNA QUANTITATIVE
HCV Quantitative Log: 6.14 {Log_IU}/mL — ABNORMAL HIGH
HCV RNA, PCR, QN: 1380000 [IU]/mL — ABNORMAL HIGH

## 2023-06-20 LAB — BASIC METABOLIC PANEL
BUN: 23 mg/dL (ref 7–25)
CO2: 17 mmol/L — ABNORMAL LOW (ref 20–32)
Calcium: 8.7 mg/dL (ref 8.6–10.3)
Chloride: 111 mmol/L — ABNORMAL HIGH (ref 98–110)
Creat: 0.84 mg/dL (ref 0.70–1.35)
Glucose, Bld: 81 mg/dL (ref 65–99)
Potassium: 4.4 mmol/L (ref 3.5–5.3)
Sodium: 138 mmol/L (ref 135–146)

## 2023-06-20 LAB — SEDIMENTATION RATE: Sed Rate: 53 mm/h — ABNORMAL HIGH (ref 0–20)

## 2023-06-22 ENCOUNTER — Telehealth: Payer: Self-pay

## 2023-06-22 NOTE — Telephone Encounter (Signed)
Attempted to contact [facility] - no answer. Will try again later. Faxed labs over as well.  Phone number [phone]

## 2023-06-22 NOTE — Progress Notes (Signed)
Subjective:    Patient ID: Chad [name], male    DOB: Nov 08, 1960, 62 y.o.   MRN: [id number] Chief Complaint  Patient presents with   Follow-up    4 weeks no studies    The patient returns today for evaluation of his left below-knee amputation site.  The site is fairly well-healed.  He expresses desire for prosthetic.  He notes that the wounds on his right lower extremity are healing per wound care.  He denies any significant pain.    Review of Systems  Musculoskeletal:  Positive for gait problem.  Skin:  Positive for wound.  Neurological:  Positive for weakness.  All other systems reviewed and are negative.      Objective:   Physical Exam Vitals reviewed.  HENT:     Head: Normocephalic.  Cardiovascular:     Rate and Rhythm: Normal rate.  Pulmonary:     Effort: Pulmonary effort is normal.  Musculoskeletal:     Left Lower Extremity: Left leg is amputated below knee.  Skin:    General: Skin is warm and dry.  Neurological:     Mental Status: He is alert and oriented to person, place, and time.  Psychiatric:        Mood and Affect: Mood normal.        Behavior: Behavior normal.        Thought Content: Thought content normal.        Judgment: Judgment normal.     BP 107/71 (BP Location: Right Arm)   Pulse 82   Resp 18   Ht 5\' 9"  (1.753 m)   Wt 155 lb (70.3 kg)   BMI 22.89 kg/m   Past Medical History:  Diagnosis Date   Anemia    Anxiety    ARF (acute respiratory failure) (HCC)    Atherosclerosis of arteries of extremities (HCC)    Benign prostatic hyperplasia with urinary obstruction    Bladder cancer (HCC)    BPH with obstruction/lower urinary tract symptoms    Chronic viral hepatitis C (HCC)    Colon cancer (HCC)    COPD (chronic obstructive pulmonary disease) (HCC)    Depression    Dysphagia    Family history of colon cancer    Family history of kidney cancer    Family history of leukemia    Family history of prostate cancer    Generalized anxiety  disorder    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    GERD (gastroesophageal reflux disease)    Hepatitis    chronic hep c   Hydronephrosis    Hydronephrosis with ureteral stricture    Hyperlipidemia    Insomnia, unspecified    Knee pain    Left   Major depressive disorder, recurrent, moderate (HCC)    Malignant neoplasm of colon (HCC)    Malnutrition (HCC)    Muscle weakness (generalized)    Nerve pain    Other abnormalities of gait and mobility    Other chronic pain    Peripheral vascular disease (HCC)    Personal history of transient cerebral ischemia    Pressure ulcer    Prostate cancer (HCC)    Stroke (HCC)    Tinea unguium    Tobacco user    Unspecified protein-calorie malnutrition (HCC)    Ureteral cancer, right (HCC)    Urinary frequency    Venous hypertension of both lower extremities    Xerosis cutis     Social History   Socioeconomic History  Marital status: Single    Spouse name: Not on file   Number of children: Not on file   Years of education: Not on file   Highest education level: Not on file  Occupational History   Not on file  Tobacco Use   Smoking status: Every Day    Current packs/day: 0.50    Types: Cigarettes   Smokeless tobacco: Never  Vaping Use   Vaping status: Never Used  Substance and Sexual Activity   Alcohol use: Not Currently   Drug use: Not Currently    Types: Marijuana, Methylphenidate    Comment: quit 1997-1998 ish   Sexual activity: Not Currently  Other Topics Concern   Not on file  Social History Narrative    used to live [place]; moved  To [place]- end of April 2019; in Nursing home; 1pp/day; quit alcohol. 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        Family- dad- prostate ca [at 78y]; brother- 56 died of prostate cancer; brother- 19- no cancers [[place]]; sonThayer Ohm [[place]];[name]-32y prostate ca [facility] [place]]; daughter- 80 [[place]]; another daughter 42 [[place]/addict]. will refer genetics counseling. Given MSI- abnormal; highly  suspicious of Lynch syndrome.  Patient's son Cristal Deer aware of high possible lynch syndrome.   Social Determinants of Health   Financial Resource Strain: Not on file  Food Insecurity: No Food Insecurity (02/26/2023)   Hunger Vital Sign    Worried About Running Out of Food in the Last Year: Never true    Ran Out of Food in the Last Year: Never true  Transportation Needs: No Transportation Needs (02/26/2023)   PRAPARE - Administrator, Civil Service (Medical): No    Lack of Transportation (Non-Medical): No  Physical Activity: Not on file  Stress: Not on file  Social Connections: Not on file  Intimate Partner Violence: Not At Risk (02/26/2023)   Humiliation, Afraid, Rape, and Kick questionnaire    Fear of Current or Ex-Partner: No    Emotionally Abused: No    Physically Abused: No    Sexually Abused: No    Past Surgical History:  Procedure Laterality Date   AMPUTATION Left 03/03/2023   Procedure: AMPUTATION BELOW KNEE;  Surgeon: Renford Dills, MD;  Location: [facility] ORS;  Service: Vascular;  Laterality: Left;   COLON SURGERY     En bloc extended right hemicolectomy 07/2017   COLONOSCOPY WITH PROPOFOL N/A 11/06/2020   Procedure: COLONOSCOPY WITH PROPOFOL;  Surgeon: Wyline Mood, MD;  Location: [facility] ENDOSCOPY;  Service: Gastroenterology;  Laterality: N/A;   COLONOSCOPY WITH PROPOFOL N/A 07/31/2021   Procedure: COLONOSCOPY WITH PROPOFOL;  Surgeon: Earline Mayotte, MD;  Location: [facility] ENDOSCOPY;  Service: Endoscopy;  Laterality: N/A;   CYSTOSCOPY W/ RETROGRADES Right 08/30/2018   Procedure: CYSTOSCOPY WITH RETROGRADE PYELOGRAM;  Surgeon: Vanna Scotland, MD;  Location: [facility];  Service: Urology;  Laterality: Right;   CYSTOSCOPY WITH STENT PLACEMENT Right 04/25/2018   Procedure: CYSTOSCOPY WITH STENT PLACEMENT;  Surgeon: Vanna Scotland, MD;  Location: [facility];  Service: Urology;  Laterality: Right;   CYSTOSCOPY WITH STENT PLACEMENT Right 08/30/2018   Procedure: CYSTOSCOPY  WITH STENT Exchange;  Surgeon: Vanna Scotland, MD;  Location: [facility];  Service: Urology;  Laterality: Right;   CYSTOSCOPY WITH STENT PLACEMENT Right 03/07/2019   Procedure: CYSTOSCOPY WITH STENT Exchange;  Surgeon: Vanna Scotland, MD;  Location: [facility];  Service: Urology;  Laterality: Right;   CYSTOSCOPY WITH STENT PLACEMENT Right 11/21/2019   Procedure: CYSTOSCOPY WITH STENT  Exchange;  Surgeon: Vanna Scotland, MD;  Location: [facility];  Service: Urology;  Laterality: Right;   LOWER EXTREMITY ANGIOGRAPHY Left 05/23/2019   Procedure: LOWER EXTREMITY ANGIOGRAPHY;  Surgeon: Annice Needy, MD;  Location: [facility];  Service: Cardiovascular;  Laterality: Left;   LOWER EXTREMITY ANGIOGRAPHY Right 05/30/2019   Procedure: LOWER EXTREMITY ANGIOGRAPHY;  Surgeon: Annice Needy, MD;  Location: [facility];  Service: Cardiovascular;  Laterality: Right;   LOWER EXTREMITY ANGIOGRAPHY Right 02/13/2020   Procedure: LOWER EXTREMITY ANGIOGRAPHY;  Surgeon: Annice Needy, MD;  Location: [facility];  Service: Cardiovascular;  Laterality: Right;   LOWER EXTREMITY ANGIOGRAPHY Left 02/20/2020   Procedure: LOWER EXTREMITY ANGIOGRAPHY;  Surgeon: Annice Needy, MD;  Location: [facility];  Service: Cardiovascular;  Laterality: Left;   LOWER EXTREMITY ANGIOGRAPHY Left 01/01/2023   Procedure: Lower Extremity Angiography;  Surgeon: Annice Needy, MD;  Location: [facility];  Service: Cardiovascular;  Laterality: Left;   PORTA CATH INSERTION N/A 02/28/2019   Procedure: PORTA CATH INSERTION;  Surgeon: Annice Needy, MD;  Location: [facility];  Service: Cardiovascular;  Laterality: N/A;   ROBOT ASSISTED LAPAROSCOPIC PARTIAL COLECTOMY  11/17/2022   tumor removed       Family History  Problem Relation Age of Onset   Prostate cancer Father 43   Cancer Brother 38       unsure type   Cancer Paternal Uncle        unsure type   Cancer Maternal Grandmother        unsure  type   Cancer Paternal Grandmother        unsure type   Kidney cancer Paternal Grandfather    Cancer Other        unsure types   Leukemia Son    Cancer Son        other cancers, possibly colon    Allergies  Allergen Reactions   Penicillins Rash       Latest Ref Rng & Units 06/16/2023    1:51 AM 05/15/2023   10:04 AM 03/31/2023    9:15 AM  CBC  WBC 3.8 - 10.8 Thousand/uL 8.3  6.3  8.0   Hemoglobin 13.2 - 17.1 g/dL 09.8  9.5  9.9   Hematocrit 38.5 - 50.0 % 33.5  30.5  31.8   Platelets 140 - 400 Thousand/uL 325  195  218       CMP     Component Value Date/Time   NA 138 06/16/2023 0151   K 4.4 06/16/2023 0151   CL 111 (H) 06/16/2023 0151   CO2 17 (L) 06/16/2023 0151   GLUCOSE 81 06/16/2023 0151   BUN 23 06/16/2023 0151   CREATININE 0.84 06/16/2023 0151   CALCIUM 8.7 06/16/2023 0151   PROT 7.0 05/15/2023 1004   ALBUMIN 2.9 (L) 05/15/2023 1004   AST 25 05/15/2023 1004   ALT 13 05/15/2023 1004   ALKPHOS 104 05/15/2023 1004   BILITOT 0.4 05/15/2023 1004   GFRNONAA >60 05/15/2023 1004     No results found.     Assessment & Plan:   1. Hx of BKA, left (HCC) He Was seen for further evaluation for left below knee prosthesis.He Is a 62 year old male who had amputation on 03/03/2023.  At the time he is well healed and ready for fitting of new right below knee prosthesis.  He is a highly motivated individual and should do well once  fitted with prosthesis.  She has no problem returning to a K2 ambulator, which will allow him to walk inside and outside of his home and overload level barriers.  2. Peripheral arterial disease with history of revascularization (HCC)   Current Outpatient Medications on File Prior to Visit  Medication Sig Dispense Refill   acetaminophen (TYLENOL) 325 MG tablet Take 650 mg by mouth 3 (three) times daily.      Amino Acids-Protein Hydrolys (PRO-STAT) LIQD Take 45 mLs by mouth daily.     ascorbic acid (VITAMIN C) 500 MG tablet Take 1 tablet (500  mg total) by mouth 2 (two) times daily.     aspirin EC 81 MG tablet Take 1 tablet (81 mg total) by mouth daily. 150 tablet 2   atorvastatin (LIPITOR) 10 MG tablet Take 1 tablet (10 mg total) by mouth daily. 30 tablet 11   Calcium Carb-Cholecalciferol (CALCIUM CARBONATE-VITAMIN D3 PO) Take 2 tablets by mouth daily. 600-400 mg     Carboxymethylcellulose Sodium (REFRESH LIQUIGEL) 1 % GEL 1 gtt OS only daily     clopidogrel (PLAVIX) 75 MG tablet Take 75 mg by mouth daily.     cyclobenzaprine (FEXMID) 7.5 MG tablet Take 15 mg by mouth at bedtime.     escitalopram (LEXAPRO) 5 MG tablet Take 5 mg by mouth daily.     ferrous fumarate (FERRETTS) 325 (106 Fe) MG TABS tablet Take 1 tablet by mouth.     gabapentin (NEURONTIN) 100 MG capsule Take 100 mg by mouth 3 (three) times daily.     ibuprofen (ADVIL) 400 MG tablet Take 1 tablet (400 mg total) by mouth every 6 (six) hours as needed for mild pain or moderate pain. (Patient not taking: Reported on 06/16/2023)     leptospermum manuka honey (MEDIHONEY) PSTE paste Apply 1 Application topically daily. (Patient not taking: Reported on 06/16/2023)     mirtazapine (REMERON) 7.5 MG tablet Take 7.5 mg by mouth at bedtime.     Multiple Vitamin (MULTIVITAMIN) tablet Take 1 tablet by mouth daily.     oxyCODONE 10 MG TABS Take 1-1.5 tablets (10-15 mg total) by mouth every 4 (four) hours as needed for severe pain or moderate pain. 30 tablet 0   Polyethyl Glycol-Propyl Glycol (SYSTANE) 0.4-0.3 % SOLN Apply to eye.     senna-docusate (SENOKOT-S) 8.6-50 MG tablet Take 1 tablet by mouth daily.     tamsulosin (FLOMAX) 0.4 MG CAPS capsule Take 1 capsule (0.4 mg total) by mouth daily after supper. 30 capsule 3   traMADol (ULTRAM) 50 MG tablet Take 1 tablet (50 mg total) by mouth every 6 (six) hours as needed. (Patient not taking: Reported on 06/16/2023) 20 tablet 0   potassium chloride SA (KLOR-CON M) 20 MEQ tablet Take 2 tablets (40 mEq total) by mouth daily. (Patient not  taking: Reported on 06/11/2023)     Current Facility-Administered Medications on File Prior to Visit  Medication Dose Route Frequency Provider Last Rate Last Admin   heparin lock flush 100 UNIT/ML injection             There are no Patient Instructions on file for this visit. No follow-ups on file.   Georgiana Spinner, NP

## 2023-06-22 NOTE — Telephone Encounter (Signed)
-----   Message from Victoriano Lain sent at 06/22/2023  7:56 AM EST ----- Please let patient know lab work is negative for any acute abnormality. However his inflammatory markers have elevated. He did not want to do IV abtx when I saw him in clinic and did not want to fu as well.   I had checked him for HCV due to prior h/o HCV recorded in chart and it is positive. Please let him know he needs to be treated for it and needs to have a follow up for additional labs for treatment planning. He can follow up with myself or any other provider for HCV whichever he prefers.

## 2023-06-23 ENCOUNTER — Encounter: Payer: Medicaid Other | Admitting: Physician Assistant

## 2023-06-23 DIAGNOSIS — I87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 hypertension (idiopathic) with ulcer and inflammation of left lower extremity: Secondary | ICD-10-CM | POA: Diagnosis not present

## 2023-06-23 NOTE — Telephone Encounter (Signed)
Spoke to [employer], Charity fundraiser at [facility] regarding lab results. Also faxed over a copy. Durwin Nora will call back if any questions.

## 2023-06-23 NOTE — Progress Notes (Signed)
ALEXENDER, SEELBACH ([id number]) [id number] Page 1 of 4 Visit Report for 06/23/2023 Chief Complaint Document Details Patient Name: Date of Service: Chad [name], Chad [name] 06/23/2023 8:30 A M Medical Record Number: [id number] Patient Account Number: [id number] Date of Birth/Sex: Treating RN: 03-06-1961 (62 y.o. Chad [name] Primary Care Provider: [name] Other Clinician: Referring Provider: Treating Provider/Extender: [name] Weeks in Treatment: 20 Information Obtained from: Patient Chief Complaint Left ankle ulcer, Left dorsal foot wound, right heel Electronic Signature(s) Signed: 06/23/2023 9:20:14 AM By: [name] PA-C Entered By: [name] on 06/23/2023 09:20:14 -------------------------------------------------------------------------------- Physician Orders Details Patient Name: Date of Service: Chad [name] 06/23/2023 8:30 A M Medical Record Number: [id number] Patient Account Number: [id number] Date of Birth/Sex: Treating RN: 1961/03/17 (62 y.o. Chad [name] Primary Care Provider: [name] Other Clinician: Referring Provider: Treating Provider/Extender: [name] in Treatment: 28 The following information was scribed by: Midge Aver The information was scribed for: [name] Verbal / Phone Orders: No Diagnosis Coding ICD-10 Coding Code Description [phone] Chronic venous hypertension (idiopathic) with ulcer and inflammation of left lower extremity 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 YOSEF, MEREDITH ([id number]) [id number] Page 2 of  4 Nurse Visit as needed Bathing/ Shower/ Hygiene Clean wound with Normal Saline or wound cleanser. No tub bath. Anesthetic (Use 'Patient Medications' Section for Anesthetic Order Entry) Lidocaine applied to wound bed Off-Loading Heel suspension boot - when in bed Turn and reposition every 2 hours Medications-Please add to medication list. ntibiotics - RX renewed 05/25/2023 P.O. 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 Remove old dressing, discard into plastic bag and place into trash. Cleanse the wound with Wound Cleanser prior to applying a clean dressing using gauze sponges, not tissues or cotton balls. Do not scrub or use excessive force. Pat dry using gauze sponges, not tissue or cotton balls. Prim Dressing: Silvercel 4 1/4x 4 1/4 (in/in) 1 x Per Day/30 Days ary Discharge Instructions: Apply Silvercel 4 1/4x 4 1/4 (in/in) as instructed Secondary Dressing: Zetuvit Plus 4x8 (in/in) 1 x Per Day/30 Days Secondary Dressing: Heel Cup 1 x Per Day/30 Days Secured With: Kerlix Roll Sterile or Non-Sterile 6-ply 4.5x4 (yd/yd) 1 x Per Day/30 Days Discharge Instructions: Apply Kerlix as directed Secured With: Tubigrip Size D, 3x10 (in/yd) 1 x Per Day/30 Days Wound #18 - T Second oe Wound Laterality: Right, Medial Cleanser: Soap and Water 1 x Per Day/30 Days Discharge Instructions: Gently cleanse wound with antibacterial soap, rinse and pat dry prior to dressing wounds Cleanser: Wound Cleanser 1 x Per Day/30 Days Discharge Instructions: Wash your hands with soap and water. Remove old dressing, discard into plastic bag and place into trash. Cleanse the wound with Wound Cleanser prior to applying a clean  dressing using gauze sponges, not tissues or cotton balls. Do not scrub or use excessive force. Pat dry using gauze sponges, not tissue or cotton balls. Prim Dressing: Silvercel Small 2x2 (in/in) 1  x Per Day/30 Days ary Discharge Instructions: Apply Silvercel Small 2x2 (in/in) as instructed Secondary Dressing: Zetuvit Plus 4x8 (in/in) 1 x Per Day/30 Days Secured With: Kerlix Roll Sterile or Non-Sterile 6-ply 4.5x4 (yd/yd) 1 x Per Day/30 Days Discharge Instructions: Apply Kerlix as directed Secured With: Tubigrip Size D, 3x10 (in/yd) 1 x Per Day/30 Days Wound #21 - Foot Wound Laterality: Dorsal, Right Cleanser: Soap and Water 1 x Per Day/30 Days Discharge Instructions: Gently cleanse wound with antibacterial soap, rinse and pat dry prior to dressing wounds Cleanser: Wound Cleanser 1 x Per Day/30 Days Discharge Instructions: Wash your hands with soap and water. Remove old dressing, discard into plastic bag and place into trash. Cleanse the wound with Wound Cleanser prior to applying a clean dressing using gauze sponges, not tissues or cotton balls. Do not scrub or use excessive force. Pat dry using gauze sponges, not tissue or cotton balls. Prim Dressing: Silvercel 4 1/4x 4 1/4 (in/in) 1 x Per Day/30 Days ary Discharge Instructions: Apply Silvercel 4 1/4x 4 1/4 (in/in) as instructed Secondary Dressing: Zetuvit Plus 4x8 (in/in) 1 x Per Day/30 Days Secured With: Kerlix Roll Sterile or Non-Sterile 6-ply 4.5x4 (yd/yd) 1 x Per Day/30 Days Discharge Instructions: Apply Kerlix as directed Secured With: Tubigrip Size D, 3x10 (in/yd) 1 x Per Day/30 Days Wound #22 - Lower Leg Wound Laterality: Right, Anterior [name] ([id number]) [id number] Page 3 of 4 Cleanser: Soap and Water 1 x Per Day/30 Days Discharge Instructions: Gently cleanse wound with antibacterial soap, rinse and pat dry prior to dressing wounds Cleanser: Wound Cleanser 1 x Per Day/30 Days Discharge  Instructions: Wash your hands with soap and water. Remove old dressing, discard into plastic bag and place into trash. Cleanse the wound with Wound Cleanser prior to applying a clean dressing using gauze sponges, not tissues or cotton balls. Do not scrub or use excessive force. Pat dry using gauze sponges, not tissue or cotton balls. Prim Dressing: Silvercel 4 1/4x 4 1/4 (in/in) 1 x Per Day/30 Days ary Discharge Instructions: Apply Silvercel 4 1/4x 4 1/4 (in/in) as instructed Secondary Dressing: Zetuvit Plus 4x8 (in/in) 1 x Per Day/30 Days Secured With: Kerlix Roll Sterile or Non-Sterile 6-ply 4.5x4 (yd/yd) 1 x Per Day/30 Days Discharge Instructions: Apply Kerlix as directed Secured With: Tubigrip Size D, 3x10 (in/yd) 1 x Per Day/30 Days Wound #23 - Lower Leg Wound Laterality: Right, Posterior Cleanser: Soap and Water 1 x Per Day/30 Days Discharge Instructions: Gently cleanse wound with antibacterial soap, rinse and pat dry prior to dressing wounds Cleanser: Wound Cleanser 1 x Per Day/30 Days Discharge Instructions: Wash your hands with soap and water. Remove old dressing, discard into plastic bag and place into trash. Cleanse the wound with Wound Cleanser prior to applying a clean dressing using gauze sponges, not tissues or cotton balls. Do not scrub or use excessive force. Pat dry using gauze sponges, not tissue or cotton balls. Prim Dressing: Silvercel 4 1/4x 4 1/4 (in/in) 1 x Per Day/30 Days ary Discharge Instructions: Apply Silvercel 4 1/4x 4 1/4 (in/in) as instructed Secondary Dressing: Zetuvit Plus 4x8 (in/in) 1 x Per Day/30 Days Secured With: Kerlix Roll Sterile or Non-Sterile 6-ply 4.5x4 (yd/yd) 1 x Per Day/30 Days Discharge Instructions: Apply Kerlix as directed Secured With: Tubigrip Size D, 3x10 (in/yd) 1 x Per Day/30 Days Electronic Signature(s) Unsigned Entered By: Midge Aver on 06/23/2023  09:24:09 -------------------------------------------------------------------------------- Problem List Details Patient Name: Date of Service: Chad [name], Chad [name] 06/23/2023 8:30 A M Medical Record Number:  [id number] Patient Account Number: [id number] Date of Birth/Sex: Treating RN: October 26, 1960 (62 y.o. 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 foot with other specified 03/26/2023 No Yes severity 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 02/19/2023 No Yes foot 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, stage 3 10/07/2022 No Yes Inactive Problems ICD-10 Code Description Active Date Inactive Date L97.322 Non-pressure chronic ulcer of left ankle with fat layer exposed 12/06/2021 12/06/2021 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

## 2023-06-25 NOTE — Progress Notes (Signed)
TREYVONNE, FELIPE ([id number]) [id number] Page 1 of 15 Visit Report for 06/23/2023 Arrival Information Details Patient Name: Date of Service: Chad [name], Chad [name] 06/23/2023 8:30 A M Medical Record Number: [id number] Patient Account Number: [id number] Date of Birth/Sex: Treating RN: 1961-04-20 (62 y.o. [name] Primary Care Shalissa Easterwood: [name] Other Clinician: Referring Kain Milosevic: Treating Jeffory Snelgrove/Extender: [name] in Treatment: 1 Visit Information History Since Last Visit Added or deleted any medications: No Patient Arrived: Wheel Chair Any new allergies or adverse reactions: No Arrival Time: 08:33 Has Dressing in Place as Prescribed: Yes Accompanied By: self Pain Present Now: No Transfer Assistance: EasyPivot Patient Lift Patient Identification Verified: Yes Secondary Verification Process Completed: Yes Patient Requires Transmission-Based No Precautions: Patient Has Alerts: Yes Patient Alerts: Patient on Blood Thinner NOT diabetic aspirin 81mg  Lives  [facility] ABI R 0.81 11/05/22 ABI L 0.59 11/05/22 [facility] 03/03/23 Electronic Signature(s) Signed: 06/24/2023 4:53:00 PM By: Midge Aver MSN RN CNS WTA Entered By: Midge Aver on 06/23/2023 08:33:27 -------------------------------------------------------------------------------- Clinic Level of Care Assessment Details Patient Name: Date of Service: Chad [name], Chad [name] 06/23/2023 8:30 A M Medical Record Number: [id number] Patient Account Number: [id number] Date of Birth/Sex: Treating RN: 01-Nov-1960 (62 y.o. [name] Primary Care Zea Kostka: [name] Other Clinician: Referring Trevor Duty: Treating Richardo Popoff/Extender: [name] Weeks in Treatment: 42 Clinic Level of Care Assessment Items TOOL 4 Quantity Score X- 1 0 Use when only an EandM is performed on FOLLOW-UP visit ASSESSMENTS - Nursing Assessment / Reassessment Chad [name], Chad [name]  ([id number]) [id number] Page 2 of 15 X- 1 10 Reassessment of Co-morbidities (includes updates in patient status) X- 1 5 Reassessment of Adherence to Treatment Plan ASSESSMENTS - Wound and Skin A ssessment / Reassessment []  - 0 Simple Wound Assessment / Reassessment - one wound X- 5 5 Complex Wound Assessment / Reassessment - multiple wounds []  - 0 Dermatologic / Skin Assessment (not related to wound area) ASSESSMENTS - Focused Assessment []  - 0 Circumferential Edema Measurements - multi extremities []  - 0 Nutritional Assessment / Counseling / Intervention []  - 0 Lower Extremity Assessment (monofilament, tuning fork, pulses) []  - 0 Peripheral Arterial Disease Assessment (using hand held doppler) ASSESSMENTS - Ostomy and/or Continence Assessment and Care []  - 0 Incontinence Assessment and Management []  - 0 Ostomy Care Assessment and Management (repouching, etc.) PROCESS - Coordination of Care []  - 0 Simple Patient / Family Education for ongoing care X- 1 20 Complex (extensive) Patient / Family Education for ongoing care X- 1 10 Staff obtains Chiropractor, Records, T Results / Process Orders est []  - 0 Staff telephones HHA, Nursing Homes / Clarify orders / etc []  - 0 Routine Transfer to another Facility (non-emergent condition) []  - 0 Routine Hospital Admission (non-emergent condition) []  - 0 New Admissions / Manufacturing engineer / Ordering NPWT Apligraf, etc. , []  - 0 Emergency Hospital Admission (emergent condition) []  - 0 Simple Discharge Coordination X- 1 15 Complex (extensive) Discharge Coordination PROCESS - Special Needs []  - 0 Pediatric / Minor Patient Management []  - 0 Isolation Patient Management []  - 0 Hearing / Language / Visual special needs []  - 0 Assessment of Community assistance (transportation, D/C planning, etc.) []  - 0 Additional assistance / Altered mentation []  - 0 Support Surface(s) Assessment (bed, cushion,  seat, etc.) INTERVENTIONS - Wound Cleansing / Measurement []  - 0 Simple Wound Cleansing - one wound X- 5 5 Complex Wound Cleansing - multiple wounds X- 1 5 Wound Imaging (photographs - any number of wounds) []  -  0 Wound Tracing (instead of photographs) []  - 0 Simple Wound Measurement - one wound X- 5 5 Complex Wound Measurement - multiple wounds INTERVENTIONS - Wound Dressings []  - 0 Small Wound Dressing one or multiple wounds X- 5 15 Medium Wound Dressing one or multiple wounds []  - 0 Large Wound Dressing one or multiple wounds []  - 0 Application of Medications - topical []  - 0 Application of Medications - injection [name] ([id number]) [id number] Page 3 of 15 INTERVENTIONS - Miscellaneous []  - 0 External ear exam []  - 0 Specimen Collection (cultures, biopsies, blood, body fluids, etc.) []  - 0 Specimen(s) / Culture(s) sent or taken to Lab for analysis []  - 0 Patient Transfer (multiple staff / Michiel Sites Lift / Similar devices) []  - 0 Simple Staple / Suture removal (25 or less) []  - 0 Complex Staple / Suture removal (26 or more) []  - 0 Hypo / Hyperglycemic Management (close monitor of Blood Glucose) []  - 0 Ankle / Brachial Index (ABI) - do not check if billed separately X- 1 5 Vital Signs Has the patient been seen at the hospital within the last three years: Yes Total Score: 220 Level Of Care: New/Established - Level 5 Electronic Signature(s) Signed: 06/24/2023 4:53:00 PM By: Midge Aver MSN RN CNS WTA Entered By: Midge Aver on 06/23/2023 11:58:11 -------------------------------------------------------------------------------- Encounter Discharge Information Details Patient Name: Date of Service: Chad [name] 06/23/2023 8:30 A M Medical Record Number: [id number] Patient Account Number: [id number] Date of Birth/Sex: Treating RN: [birth year] (62 y.o. [name] Primary Care Domingue Coltrain: [name] Other  Clinician: Referring Aloria Looper: Treating Dashea Mcmullan/Extender: [name] in Treatment: 24 Encounter Discharge Information Items Discharge Condition: Stable Ambulatory Status: Wheelchair Discharge Destination: Skilled Nursing Facility Telephoned: No Orders Sent: Yes Transportation: Other Accompanied By: self Schedule Follow-up Appointment: Yes Clinical Summary of Care: Electronic Signature(s) Signed: 06/24/2023 4:53:00 PM By: Midge Aver MSN RN CNS WTA Entered By: Midge Aver on 06/23/2023 11:59:28 [name] ([id number]) [id number] Page 4 of 15 -------------------------------------------------------------------------------- Lower Extremity Assessment Details Patient Name: Date of Service: Chad [name], Chad [name] 06/23/2023 8:30 A M Medical Record Number: [id number] Patient Account Number: [id number] Date of Birth/Sex: Treating RN: 1960-08-22 (62 y.o. [name] Primary Care Destinie Thornsberry: [name] Other Clinician: Referring Rain Friedt: Treating Grabiela Wohlford/Extender: [name] Weeks in Treatment: 80 Edema Assessment Assessed: [Left: No] [Right: No] [Left: Edema] [Right: :] Calf Left: Right: Point of Measurement: 33 cm From Medial Instep 41 cm Ankle Left: Right: Point of Measurement: 12 cm From Medial Instep 25 cm Vascular Assessment Pulses: Dorsalis Pedis Palpable: [Right:Yes] Extremity colors, hair growth, and conditions: Extremity Color: [Right:Pale] Hair Growth on Extremity: [Right:No] Temperature of Extremity: [Right:Warm] Capillary Refill: [Right:> 3 seconds] Dependent Rubor: [Right:No] Blanched when Elevated: [Right:No No] Toe Nail Assessment Left: Right: Thick: Yes Discolored: Yes Deformed: Yes Improper Length and Hygiene: Yes Electronic Signature(s) Signed: 06/24/2023 4:53:00 PM By: Midge Aver MSN RN CNS WTA Entered By: Midge Aver on 06/23/2023 09:13:42 [name] ([id number])  [id number] Page 5 of 15 -------------------------------------------------------------------------------- Multi Wound Chart Details Patient Name: Date of Service: Chad [name], Chad [name] 06/23/2023 8:30 A M Medical Record Number: [id number] Patient Account Number: [id number] Date of Birth/Sex: Treating RN: [birth year] (62 y.o. [name] Primary Care Delance Weide: [name] Other Clinician: Referring Reverie Vaquera: Treating Lailany Enoch/Extender: [name] Weeks in Treatment: 80 Vital Signs Height(in): 69 Pulse(bpm): 106 Weight(lbs): 150 Blood Pressure(mmHg): 110/60 Body Mass Index(BMI): 22.1 Temperature(F): 98.0 Respiratory Rate(breaths/min): 18 [16:Photos:] Right Calcaneus Right, Medial T Second  oe Right, Dorsal Foot Wound Location: Gradually Appeared Pressure Injury Pressure Injury Wounding Event: Pressure Ulcer Pressure Ulcer Venous Leg Ulcer Primary Etiology: Anemia, Chronic Obstructive Anemia, Chronic Obstructive Anemia, Chronic Obstructive Comorbid History: Pulmonary Disease (COPD), Coronary Pulmonary Disease (COPD), Coronary Pulmonary Disease (COPD), Coronary Artery Disease, Peripheral Arterial Artery Disease, Peripheral Arterial Artery Disease, Peripheral Arterial Disease, Peripheral Venous Disease, Disease, Peripheral Venous Disease, Disease, Peripheral Venous Disease, Hepatitis C, Osteoarthritis, Hepatitis C, Osteoarthritis, Hepatitis C, Osteoarthritis, Neuropathy, Received Chemotherapy Neuropathy, Received Chemotherapy Neuropathy, Received Chemotherapy 09/26/2022 09/26/2022 03/02/2023 Date Acquired: 37 36 12 Weeks of Treatment: Open Open Open Wound Status: No No No Wound Recurrence: Yes No Yes Clustered Wound: 10x9x0.2 2.5x1.3x0.1 4.5x4.7x0.2 Measurements L x W x D (cm) 70.686 2.553 16.611 A (cm) : rea 14.137 0.255 3.322 Volume (cm) : -7403.80% -364.20% -840.10% % Reduction in Area: -7419.70% -363.60% -1776.80% % Reduction in  Volume: Category/Stage III Category/Stage III Full Thickness Without Exposed Classification: Support Structures Medium Medium Medium Exudate Amount: Serosanguineous Serosanguineous Serous Exudate Type: red, brown red, brown amber Exudate Color: Wound Number: 22 23 N/A Photos: N/A Right, Anterior Lower Leg Right, Posterior Lower Leg N/A Wound Location: Pressure Injury Pressure Injury N/A Wounding Event: Venous Leg Ulcer Pressure Ulcer N/A Primary Etiology: Anemia, Chronic Obstructive Anemia, Chronic Obstructive N/A Comorbid History: Pulmonary Disease (COPD), Coronary Pulmonary Disease (COPD), Coronary Artery Disease, Peripheral Arterial Artery Disease, Peripheral Arterial Disease, Peripheral Venous Disease, Disease, Peripheral Venous Disease, Mobley, [name] ([id number]) [id number] Page 6 of 15 Hepatitis C, Osteoarthritis, Hepatitis C, Osteoarthritis, Neuropathy, Received Chemotherapy Neuropathy, Received Chemotherapy 03/02/2023 05/02/2023 N/A Date Acquired: 12 6 N/A Weeks of Treatment: Open Open N/A Wound Status: No No N/A Wound Recurrence: Yes Yes N/A Clustered Wound: 8.5x9.2x0.1 7x5x0.1 N/A Measurements L x W x D (cm) 61.418 27.489 N/A A (cm) : rea 6.142 2.749 N/A Volume (cm) : -2506.90% -75.00% N/A % Reduction in Area: -1204.00% -75.00% N/A % Reduction in Volume: Full Thickness Without Exposed Category/Stage III N/A Classification: Support Structures Medium Medium N/A Exudate Amount: Serous Serosanguineous N/A Exudate Type: amber red, brown N/A Exudate Color: Treatment Notes Electronic Signature(s) Signed: 06/24/2023 4:53:00 PM By: Midge Aver MSN RN CNS WTA Entered By: Midge Aver on 06/23/2023 09:20:57 -------------------------------------------------------------------------------- Multi-Disciplinary Care Plan Details Patient Name: Date of Service: Chad [name] 06/23/2023 8:30 A M Medical Record Number:  [id number] Patient Account Number: [id number] Date of Birth/Sex: Treating RN: [birth year] (62 y.o. [name] Primary Care Anayah Arvanitis: [name] Other Clinician: Referring Destanie Tibbetts: Treating Annjanette Wertenberger/Extender: [name] Weeks in Treatment: 39 Active Inactive Venous Leg Ulcer Nursing Diagnoses: Knowledge deficit related to disease process and management Goals: Patient will maintain optimal edema control Date Initiated: 12/06/2021 Target Resolution Date: 07/26/2023 Goal Status: Active Patient/caregiver will verbalize understanding of disease process and disease management Date Initiated: 12/06/2021 Date Inactivated: 12/19/2022 Target Resolution Date: 11/27/2022 Goal Status: Met Interventions: Assess peripheral edema status every visit. Compression as ordered Notes: Electronic Signature(s) Signed: 06/24/2023 4:53:00 PM By: Midge Aver MSN RN CNS WTA Entered By: Midge Aver on 06/23/2023 11:58:25 [name] ([id number]) [id number] Page 7 of 15 -------------------------------------------------------------------------------- Pain Assessment Details Patient Name: Date of Service: Chad [name], Chad [name] 06/23/2023 8:30 A M Medical Record Number: [id number] Patient Account Number: [id number] Date of Birth/Sex: Treating RN: [birth year] (62 y.o. [name] Primary Care Alixander Rallis: [name] Other Clinician: Referring Adya Wirz: Treating Avyay Coger/Extender: [name] Weeks in Treatment: 9 Active Problems Location of Pain Severity and Description of Pain Patient Has Paino No Site Locations Pain Management and  Medication Current Pain Management: Electronic Signature(s) Signed: 06/24/2023 4:53:00 PM By: Midge Aver MSN RN CNS WTA Entered By: Midge Aver on 06/23/2023 08:53:46 -------------------------------------------------------------------------------- Patient/Caregiver Education Details Patient Name: Date of  Service: Chad [name] 11/26/2024andnbsp8:30 A M Medical Record Number: [id number] Patient Account Number: [id number] Date of Birth/Gender: Treating RN: 1961/03/14 (62 y.o. [name] Primary Care Physician: [name] Other Clinician: Referring Physician: Treating Physician/Extender: [name] Weeks in Treatment: [address] [place] ([id number]) [id number] Page 8 of 15 Education Assessment Education Provided To: Patient Education Topics Provided Wound/Skin Impairment: Handouts: Caring for Your Ulcer Methods: Explain/Verbal Responses: State content correctly Electronic Signature(s) Signed: 06/24/2023 4:53:00 PM By: Midge Aver MSN RN CNS WTA Entered By: Midge Aver on 06/23/2023 11:58:38 -------------------------------------------------------------------------------- Wound Assessment Details Patient Name: Date of Service: Chad [name], Chad [name] 06/23/2023 8:30 A M Medical Record Number: [id number] Patient Account Number: [id number] Date of Birth/Sex: Treating RN: September 06, 1960 (62 y.o. [name] Primary Care Bless Belshe: [name] Other Clinician: Referring Honestee Revard: Treating Evanell Redlich/Extender: [name] Weeks in Treatment: 80 Wound Status Wound Number: 16 Primary Pressure Ulcer Etiology: Wound Location: Right Calcaneus Wound Open Wounding Event: Gradually Appeared Status: Date Acquired: 09/26/2022 Comorbid Anemia, Chronic Obstructive Pulmonary Disease (COPD), Coronary Weeks Of Treatment: 37 History: Artery Disease, Peripheral Arterial Disease, Peripheral Venous Clustered Wound: Yes Disease, Hepatitis C, Osteoarthritis, Neuropathy, Received Chemotherapy Photos Wound Measurements Length: (cm) 10 Width: (cm) 9 Depth: (cm) 0.2 Area: (cm) 70.686 Volume: (cm) 14.137 % Reduction in Area: -7403.8% % Reduction in Volume: -7419.7% Wound Description Classification: Category/Stage III Exudate Amount:  Medium Capote, Antoinette ([id number]) Exudate Type: Serosanguineous Exudate Color: red, brown [id number] Page 9 of 15 Treatment Notes Wound #16 (Calcaneus) Wound Laterality: Right Cleanser Soap and Water Discharge Instruction: Gently cleanse wound with antibacterial soap, rinse and pat dry prior to dressing wounds Wound Cleanser Discharge Instruction: Wash your hands with soap and water. Remove old dressing, discard into plastic bag and place into trash. Cleanse the wound with Wound Cleanser prior to applying a clean dressing using gauze sponges, not tissues or cotton balls. Do not scrub or use excessive force. Pat dry using gauze sponges, not tissue or cotton balls. Peri-Wound Care Topical Primary Dressing Silvercel 4 1/4x 4 1/4 (in/in) Discharge Instruction: Apply Silvercel 4 1/4x 4 1/4 (in/in) as instructed Secondary Dressing Zetuvit Plus 4x8 (in/in) Heel Cup Secured With [employer] Sterile or Non-Sterile 6-ply 4.5x4 (yd/yd) Discharge Instruction: Apply Kerlix as directed Tubigrip Size D, 3x10 (in/yd) Compression Wrap Compression Stockings Add-Ons Electronic Signature(s) Signed: 06/24/2023 4:53:00 PM By: Midge Aver MSN RN CNS WTA Entered By: Midge Aver on 06/23/2023 09:11:21 -------------------------------------------------------------------------------- Wound Assessment Details Patient Name: Date of Service: Chad [name] 06/23/2023 8:30 A M Medical Record Number: [id number] Patient Account Number: [id number] Date of Birth/Sex: Treating RN: August 28, 1960 (62 y.o. [name] Primary Care Gaspard Isbell: [name] Other Clinician: Referring Arisa Congleton: Treating Stevon Gough/Extender: [name] Weeks in Treatment: 80 Wound Status Wound Number: 18 Primary Pressure Ulcer Etiology: Wound Location: Right, Medial T Second oe Wound Open Wounding Event: Pressure Injury Status: Date Acquired: 09/26/2022 Comorbid Anemia, Chronic  Obstructive Pulmonary Disease (COPD), Coronary Weeks Of Treatment: 36 History: Artery Disease, Peripheral Arterial Disease, Peripheral Venous Clustered Wound: No Disease, Hepatitis C, Osteoarthritis, Neuropathy, Received Chemotherapy Photos ALBARAA, BUNTAIN ([id number]) [id number] Page 10 of 15 Wound Measurements Length: (cm) 2.5 Width: (cm) 1.3 Depth: (cm) 0.1 Area: (cm) 2.553 Volume: (cm) 0.255 % Reduction in Area: -364.2% % Reduction in Volume: -363.6% Wound Description Classification:  Exudate Amount: Exudate Type: Exudate Color: Category/Stage III Medium Serosanguineous red, brown Treatment Notes Wound #18 (Toe Second) Wound Laterality: Right, Medial Cleanser Soap and Water Discharge Instruction: Gently cleanse wound with antibacterial soap, rinse and pat dry prior to dressing wounds Wound Cleanser Discharge Instruction: Wash your hands with soap and water. Remove old dressing, discard into plastic bag and place into trash. Cleanse the wound with Wound Cleanser prior to applying a clean dressing using gauze sponges, not tissues or cotton balls. Do not scrub or use excessive force. Pat dry using gauze sponges, not tissue or cotton balls. Peri-Wound Care Topical Primary Dressing Silvercel Small 2x2 (in/in) Discharge Instruction: Apply Silvercel Small 2x2 (in/in) as instructed Secondary Dressing Zetuvit Plus 4x8 (in/in) Secured With [employer] Sterile or Non-Sterile 6-ply 4.5x4 (yd/yd) Discharge Instruction: Apply Kerlix as directed Tubigrip Size D, 3x10 (in/yd) Compression Wrap Compression Stockings Add-Ons Electronic Signature(s) Signed: 06/24/2023 4:53:00 PM By: Midge Aver MSN RN CNS WTA Entered By: Midge Aver on 06/23/2023 09:11:43 [name] ([id number]) [id number] Page 11 of 15 -------------------------------------------------------------------------------- Wound Assessment Details Patient Name:  Date of Service: Chad [name], Chad [name] 06/23/2023 8:30 A M Medical Record Number: [id number] Patient Account Number: [id number] Date of Birth/Sex: Treating RN: 1960/11/26 (62 y.o. [name] Primary Care Sayf Kerner: [name] Other Clinician: Referring Panagiota Perfetti: Treating Yasmene Salomone/Extender: [name] Weeks in Treatment: 80 Wound Status Wound Number: 21 Primary Venous Leg Ulcer Etiology: Wound Location: Right, Dorsal Foot Wound Open Wounding Event: Pressure Injury Status: Date Acquired: 03/02/2023 Comorbid Anemia, Chronic Obstructive Pulmonary Disease (COPD), Coronary Weeks Of Treatment: 12 History: Artery Disease, Peripheral Arterial Disease, Peripheral Venous Clustered Wound: Yes Disease, Hepatitis C, Osteoarthritis, Neuropathy, Received Chemotherapy Photos Wound Measurements Length: (cm) 4.5 Width: (cm) 4.7 Depth: (cm) 0.2 Area: (cm) 16.611 Volume: (cm) 3.322 % Reduction in Area: -840.1% % Reduction in Volume: -1776.8% Wound Description Classification: Full Thickness Without Exposed Support Exudate Amount: Medium Exudate Type: Serous Exudate Color: amber Structures Treatment Notes Wound #21 (Foot) Wound Laterality: Dorsal, Right Cleanser Soap and Water Discharge Instruction: Gently cleanse wound with antibacterial soap, rinse and pat dry prior to dressing wounds Wound Cleanser Discharge Instruction: Wash your hands with soap and water. Remove old dressing, discard into plastic bag and place into trash. Cleanse the wound with Wound Cleanser prior to applying a clean dressing using gauze sponges, not tissues or cotton balls. Do not scrub or use excessive force. Pat dry using gauze sponges, not tissue or cotton balls. Peri-Wound Care Topical Primary Dressing DEONTAYE, FARVER ([id number]) [id number] Page 12 of 15 Silvercel 4 1/4x 4 1/4 (in/in) Discharge Instruction: Apply Silvercel 4 1/4x 4 1/4 (in/in) as instructed Secondary  Dressing Zetuvit Plus 4x8 (in/in) Secured With [employer] Sterile or Non-Sterile 6-ply 4.5x4 (yd/yd) Discharge Instruction: Apply Kerlix as directed Tubigrip Size D, 3x10 (in/yd) Compression Wrap Compression Stockings Add-Ons Electronic Signature(s) Signed: 06/24/2023 4:53:00 PM By: Midge Aver MSN RN CNS WTA Entered By: Midge Aver on 06/23/2023 09:12:03 -------------------------------------------------------------------------------- Wound Assessment Details Patient Name: Date of Service: Chad [name] 06/23/2023 8:30 A M Medical Record Number: [id number] Patient Account Number: [id number] Date of Birth/Sex: Treating RN: Mar 20, 1961 (62 y.o. [name] Primary Care Javohn Basey: [name] Other Clinician: Referring Jaramiah Bossard: Treating Shayde Gervacio/Extender: [name] Weeks in Treatment: 80 Wound Status Wound Number: 22 Primary Venous Leg Ulcer Etiology: Wound Location: Right, Anterior Lower Leg Wound Open Wounding Event: Pressure Injury Status: Date Acquired: 03/02/2023 Comorbid Anemia, Chronic Obstructive Pulmonary Disease (COPD), Coronary Weeks Of Treatment: 12 History: Artery  Disease, Peripheral Arterial Disease, Peripheral Venous Clustered Wound: Yes Disease, Hepatitis C, Osteoarthritis, Neuropathy, Received Chemotherapy Photos Wound Measurements Length: (cm) 8.5 Width: (cm) 9.2 Depth: (cm) 0.1 Area: (cm) 61.418 Volume: (cm) 6.142 % Reduction in Area: -2506.9% % Reduction in Volume: -1204% Wound Description Classification: Full Thickness Without Exposed Support Structures Caroll, [name] ([id number]) Exudate Amount: Medium Exudate Type: Serous Exudate Color: amber [phone].pdf Page 13 of 15 Treatment Notes Wound #22 (Lower Leg) Wound Laterality: Right, Anterior Cleanser Soap and Water Discharge Instruction: Gently cleanse wound with antibacterial soap, rinse and pat dry prior to dressing wounds Wound  Cleanser Discharge Instruction: Wash your hands with soap and water. Remove old dressing, discard into plastic bag and place into trash. Cleanse the wound with Wound Cleanser prior to applying a clean dressing using gauze sponges, not tissues or cotton balls. Do not scrub or use excessive force. Pat dry using gauze sponges, not tissue or cotton balls. Peri-Wound Care Topical Primary Dressing Silvercel 4 1/4x 4 1/4 (in/in) Discharge Instruction: Apply Silvercel 4 1/4x 4 1/4 (in/in) as instructed Secondary Dressing Zetuvit Plus 4x8 (in/in) Secured With [employer] Sterile or Non-Sterile 6-ply 4.5x4 (yd/yd) Discharge Instruction: Apply Kerlix as directed Tubigrip Size D, 3x10 (in/yd) Compression Wrap Compression Stockings Add-Ons Electronic Signature(s) Signed: 06/24/2023 4:53:00 PM By: Midge Aver MSN RN CNS WTA Entered By: Midge Aver on 06/23/2023 09:12:31 -------------------------------------------------------------------------------- Wound Assessment Details Patient Name: Date of Service: Chad [name] 06/23/2023 8:30 A M Medical Record Number: [id number] Patient Account Number: [id number] Date of Birth/Sex: Treating RN: February 04, 1961 (62 y.o. [name] Ashante Yellin: [name] Other Clinician: Referring Jetta Murray: Treating Sherman Lipuma/Extender: [name] Weeks in Treatment: 80 Wound Status Wound Number: 23 Primary Pressure Ulcer Etiology: Wound Location: Right, Posterior Lower Leg Wound Open Wounding Event: Pressure Injury Status: Date Acquired: 05/02/2023 Comorbid Anemia, Chronic Obstructive Pulmonary Disease (COPD), Coronary Weeks Of Treatment: 6 History: Artery Disease, Peripheral Arterial Disease, Peripheral Venous Clustered Wound: Yes Disease, Hepatitis C, Osteoarthritis, Neuropathy, Received Chemotherapy Photos Chad [name], Chad [name] ([id number]) [id number] Page 14 of 15 Wound Measurements Length: (cm)  7 Width: (cm) 5 Depth: (cm) 0.1 Area: (cm) 27.489 Volume: (cm) 2.749 % Reduction in Area: -75% % Reduction in Volume: -75% Wound Description Classification: Exudate Amount: Exudate Type: Exudate Color: Category/Stage III Medium Serosanguineous red, brown Treatment Notes Wound #23 (Lower Leg) Wound Laterality: Right, Posterior Cleanser Soap and Water Discharge Instruction: Gently cleanse wound with antibacterial soap, rinse and pat dry prior to dressing wounds Wound Cleanser Discharge Instruction: Wash your hands with soap and water. Remove old dressing, discard into plastic bag and place into trash. Cleanse the wound with Wound Cleanser prior to applying a clean dressing using gauze sponges, not tissues or cotton balls. Do not scrub or use excessive force. Pat dry using gauze sponges, not tissue or cotton balls. Peri-Wound Care Topical Primary Dressing Silvercel 4 1/4x 4 1/4 (in/in) Discharge Instruction: Apply Silvercel 4 1/4x 4 1/4 (in/in) as instructed Secondary Dressing Zetuvit Plus 4x8 (in/in) Secured With [employer] Sterile or Non-Sterile 6-ply 4.5x4 (yd/yd) Discharge Instruction: Apply Kerlix as directed Tubigrip Size D, 3x10 (in/yd) Compression Wrap Compression Stockings Add-Ons Electronic Signature(s) Signed: 06/24/2023 4:53:00 PM By: Midge Aver MSN RN CNS WTA Entered By: Midge Aver on 06/23/2023 09:12:51 [name] ([id number]) [id number] Page 15 of 15 -------------------------------------------------------------------------------- Vitals Details Patient Name: Date of Service: Chad [name], Chad [name] 06/23/2023 8:30 A M Medical Record Number: [id number] Patient Account Number: [id number] Date of Birth/Sex: Treating RN: Apr 07, 1961 (62 y.o. [name]  Primary Care Milton Streicher: [name] Other Clinician: Referring Tiernan Millikin: Treating Quartez Lagos/Extender: [name] in Treatment: 80 Vital Signs Time  Taken: 08:44 Temperature (F): 98.0 Height (in): 69 Pulse (bpm): 106 Weight (lbs): 150 Respiratory Rate (breaths/min): 18 Body Mass Index (BMI): 22.1 Blood Pressure (mmHg): 110/60 Reference Range: 80 - 120 mg / dl Electronic Signature(s) Signed: 06/24/2023 4:53:00 PM By: Midge Aver MSN RN CNS WTA Entered By: Midge Aver on 06/23/2023 08:52:16

## 2023-07-01 ENCOUNTER — Encounter: Payer: Self-pay | Admitting: Infectious Diseases

## 2023-07-01 ENCOUNTER — Telehealth: Payer: Self-pay

## 2023-07-01 ENCOUNTER — Other Ambulatory Visit: Payer: Self-pay

## 2023-07-01 ENCOUNTER — Ambulatory Visit (INDEPENDENT_AMBULATORY_CARE_PROVIDER_SITE_OTHER): Payer: Medicaid Other | Admitting: Infectious Diseases

## 2023-07-01 VITALS — BP 101/68 | HR 66 | Resp 16 | Ht 69.0 in | Wt 155.0 lb

## 2023-07-01 DIAGNOSIS — L97909 Non-pressure chronic ulcer of unspecified part of unspecified lower leg with unspecified severity: Secondary | ICD-10-CM | POA: Diagnosis not present

## 2023-07-01 DIAGNOSIS — I739 Peripheral vascular disease, unspecified: Secondary | ICD-10-CM

## 2023-07-01 DIAGNOSIS — B182 Chronic viral hepatitis C: Secondary | ICD-10-CM

## 2023-07-01 DIAGNOSIS — Z1159 Encounter for screening for other viral diseases: Secondary | ICD-10-CM | POA: Diagnosis not present

## 2023-07-01 DIAGNOSIS — Z114 Encounter for screening for human immunodeficiency virus [HIV]: Secondary | ICD-10-CM | POA: Insufficient documentation

## 2023-07-01 NOTE — Progress Notes (Unsigned)
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   Genetic testing 06/15/2019   Swelling of limb 04/15/2019   History of colon cancer 03/24/2019   Family history of prostate cancer    Family history of colon cancer    Family history of leukemia    Family history of kidney cancer    Goals of care, counseling/discussion 12/25/2018   Chronic pain syndrome 11/18/2018   Peripheral vascular disease of lower extremity with ulceration (HCC) 10/04/2018   Neoplasm related pain 09/08/2018   Poor appetite 09/08/2018   Weakness generalized 07/02/2018   Anorexia 07/02/2018   Chronic pain due to neoplasm 07/02/2018   Iron deficiency anemia due to chronic blood loss 05/27/2018   Ureteral cancer, 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    AMINO ACIDS-PROTEIN HYDROLYS (PRO-STAT) LIQD    Take 45 mLs by mouth daily.   ASCORBIC ACID (VITAMIN C) 500 MG TABLET    Take 1 tablet (500 mg total) by mouth 2 (two) times daily.   ASPIRIN EC 81 MG TABLET    Take 1 tablet (81 mg total) by mouth daily.   ATORVASTATIN (LIPITOR) 10 MG TABLET    Take  1 tablet (10 mg total) by mouth daily.   CALCIUM CARB-CHOLECALCIFEROL (CALCIUM CARBONATE-VITAMIN D3 PO)    Take 2 tablets by mouth daily. 600-400 mg   CARBOXYMETHYLCELLULOSE SODIUM (REFRESH LIQUIGEL) 1 % GEL    1 gtt OS only daily   CLOPIDOGREL (PLAVIX) 75 MG TABLET    Take 75 mg by mouth daily.   CYCLOBENZAPRINE (FEXMID) 7.5 MG TABLET    Take 15 mg by mouth at bedtime.   ESCITALOPRAM (LEXAPRO) 5 MG TABLET    Take 5 mg by mouth daily.   FERROUS FUMARATE (FERRETTS) 325 (106 FE) MG TABS TABLET    Take 1 tablet by mouth.   GABAPENTIN (NEURONTIN) 100 MG CAPSULE    Take 100 mg by mouth 3 (three) times daily.   IBUPROFEN (ADVIL) 400 MG TABLET    Take 1 tablet (400 mg total) by mouth every 6 (six) hours as needed for mild pain or moderate pain.   LEPTOSPERMUM MANUKA HONEY (MEDIHONEY) PSTE PASTE    Apply 1 Application topically daily.   MIRTAZAPINE (REMERON) 7.5 MG TABLET    Take 7.5 mg by mouth at bedtime.   MULTIPLE VITAMIN (MULTIVITAMIN) TABLET    Take 1 tablet by mouth daily.   OXYCODONE 10 MG TABS    Take 1-1.5 tablets (10-15 mg total) by mouth every 4 (four) hours as needed for severe pain or moderate  pain.   POLYETHYL GLYCOL-PROPYL GLYCOL (SYSTANE) 0.4-0.3 % SOLN    Apply to eye.   POTASSIUM CHLORIDE SA (KLOR-CON M) 20 MEQ TABLET    Take 2 tablets (40 mEq total) by mouth daily.   SENNA-DOCUSATE (SENOKOT-S) 8.6-50 MG TABLET    Take 1 tablet by mouth daily.   TAMSULOSIN (FLOMAX) 0.4 MG CAPS CAPSULE    Take 1 capsule (0.4 mg total) by mouth daily after supper.   TRAMADOL (ULTRAM) 50 MG TABLET    Take 1 tablet (50 mg total) by mouth every 6 (six) hours as needed.  Modified Medications   No medications on file  Discontinued Medications   No medications on file    Subjective: 62 Y O male with h/o Urothelial ca, colon ca s/p rt hemicolectomy, COPD, HCV, BPH, anxiety/depression, HLD, malnutrition, CVA s/p left sided weakness, PVD, s/p left BKA, chronic venous insufficiency,  DU of rt leg and foot who  is referred from wound care for concerns for infection of wounds in the Rt LE. Patient has been following with wound care for rt calcaneus, rt dorsal foot wound, rt posterior leg, rt anterior leg, rt medial second toe wounds for last several months.   Reports he had chronic wounds in the rt leg and rt foot for several months. He is very frustrated about noone  being unable to do something to heal the wound. Describes yellowish intermittent drainage from the wound but cannot tell me if its foul smelling. Reports completing recently 2 weeks course of PO levofloxacin and he is adamant about not doing another course of abtx as he feels antibiotics have not healed his wounds. He told me " Do I have to keep on suffering from this wounds forever? Is there a way to get rid of these wound ? " However, he also declined seeing Orthopedics Dr Lajoyce Corners to see if he needs a surgical intervention. I attempted to explain him that he was referred to me for evaluation of possible need for IV abtx due to non healing of the wound despite of po abtx. Denies fevers, chills. Denies nausea, vomiting, diarrhea or any symptoms concerning for wound infection   He has been following with wound care, last seen 11/12 when they were concerned for infection being present despite completing course of levofloxacin. He was last seen by Vascular 10/16 for left BKA amputation site where wound was healing except a very small 1cm area of opening, plan to fu in 4 weeks.   Regarding HCV, he says everything is good and he is here for his Rt leg and nothing else. He was not willing to discuss about HCV.   12/4 He reports he is aware about his HCV but has never been treated for it. 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 Reports his ex wife also had HCV. Denies h/o blood transfusion,  family h/o liver disease, liver cancer.   Denies any changes to his rt leg and foot after last visit. Last seen by wound care Dr Leonard Schwartz  11/26 and reviewed notes which mentioned significant signs of improvement although tremendous amount of drainage present. Denies fevers, chills. Denies nausea, vomiting or diarrhea. I reviewed MAR and he is not on antibiotics.   Review of Systems: as above  Past Medical History:  Diagnosis Date   Anemia    Anxiety    ARF (acute respiratory failure) (HCC)    Atherosclerosis of arteries of extremities (HCC)  Benign prostatic hyperplasia with urinary obstruction    Bladder cancer (HCC)    BPH with obstruction/lower urinary tract symptoms    Chronic viral hepatitis C (HCC)    Colon cancer (HCC)    COPD (chronic obstructive pulmonary disease) (HCC)    Depression    Dysphagia    Family history of colon cancer    Family history of kidney cancer    Family history of leukemia    Family history of prostate cancer    Generalized anxiety disorder    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    GERD (gastroesophageal reflux disease)    Hepatitis    chronic hep c   Hydronephrosis    Hydronephrosis with ureteral stricture    Hyperlipidemia    Insomnia, unspecified    Knee pain    Left   Major depressive disorder, recurrent, moderate (HCC)    Malignant neoplasm of colon (HCC)    Malnutrition (HCC)    Muscle weakness (generalized)    Nerve pain    Other abnormalities of gait and mobility    Other chronic pain    Peripheral vascular disease (HCC)    Personal history of transient cerebral ischemia    Pressure ulcer    Prostate cancer (HCC)    Stroke (HCC)    Tinea unguium    Tobacco user    Unspecified protein-calorie malnutrition (HCC)    Ureteral cancer, right (HCC)    Urinary frequency    Venous hypertension of both lower extremities    Xerosis cutis    Past Surgical History:  Procedure Laterality Date   AMPUTATION Left 03/03/2023   Procedure: AMPUTATION BELOW KNEE;  Surgeon: Renford Dills, MD;  Location: [facility];  Service: Vascular;  Laterality: Left;   COLON  SURGERY     En bloc extended right hemicolectomy 07/2017   COLONOSCOPY WITH PROPOFOL N/A 11/06/2020   Procedure: COLONOSCOPY WITH PROPOFOL;  Surgeon: Wyline Mood, MD;  Location: [facility];  Service: Gastroenterology;  Laterality: N/A;   COLONOSCOPY WITH PROPOFOL N/A 07/31/2021   Procedure: COLONOSCOPY WITH PROPOFOL;  Surgeon: Earline Mayotte, MD;  Location: [facility] ENDOSCOPY;  Service: Endoscopy;  Laterality: N/A;   CYSTOSCOPY W/ RETROGRADES Right 08/30/2018   Procedure: CYSTOSCOPY WITH RETROGRADE PYELOGRAM;  Surgeon: Vanna Scotland, MD;  Location: [facility];  Service: Urology;  Laterality: Right;   CYSTOSCOPY WITH STENT PLACEMENT Right 04/25/2018   Procedure: CYSTOSCOPY WITH STENT PLACEMENT;  Surgeon: Vanna Scotland, MD;  Location: [facility];  Service: Urology;  Laterality: Right;   CYSTOSCOPY WITH STENT PLACEMENT Right 08/30/2018   Procedure: CYSTOSCOPY WITH STENT Exchange;  Surgeon: Vanna Scotland, MD;  Location: [facility];  Service: Urology;  Laterality: Right;   CYSTOSCOPY WITH STENT PLACEMENT Right 03/07/2019   Procedure: CYSTOSCOPY WITH STENT Exchange;  Surgeon: Vanna Scotland, MD;  Location: [facility];  Service: Urology;  Laterality: Right;   CYSTOSCOPY WITH STENT PLACEMENT Right 11/21/2019   Procedure: CYSTOSCOPY WITH STENT Exchange;  Surgeon: Vanna Scotland, MD;  Location: [facility];  Service: Urology;  Laterality: Right;   LOWER EXTREMITY ANGIOGRAPHY Left 05/23/2019   Procedure: LOWER EXTREMITY ANGIOGRAPHY;  Surgeon: Annice Needy, MD;  Location: [facility];  Service: Cardiovascular;  Laterality: Left;   LOWER EXTREMITY ANGIOGRAPHY Right 05/30/2019   Procedure: LOWER EXTREMITY ANGIOGRAPHY;  Surgeon: Annice Needy, MD;  Location: [facility];  Service: Cardiovascular;  Laterality: Right;   LOWER EXTREMITY ANGIOGRAPHY Right 02/13/2020   Procedure: LOWER EXTREMITY ANGIOGRAPHY;  Surgeon:  Annice Needy, MD;  Location: [facility];  Service: Cardiovascular;   Laterality: Right;   LOWER EXTREMITY ANGIOGRAPHY Left 02/20/2020   Procedure: LOWER EXTREMITY ANGIOGRAPHY;  Surgeon: Annice Needy, MD;  Location: [facility];  Service: Cardiovascular;  Laterality: Left;   LOWER EXTREMITY ANGIOGRAPHY Left 01/01/2023   Procedure: Lower Extremity Angiography;  Surgeon: Annice Needy, MD;  Location: [facility];  Service: Cardiovascular;  Laterality: Left;   PORTA CATH INSERTION N/A 02/28/2019   Procedure: PORTA CATH INSERTION;  Surgeon: Annice Needy, MD;  Location: [facility];  Service: Cardiovascular;  Laterality: N/A;   ROBOT ASSISTED LAPAROSCOPIC PARTIAL COLECTOMY  11/17/2022   tumor removed       Social History   Tobacco Use   Smoking status: Every Day    Current packs/day: 0.50    Types: Cigarettes    Passive exposure: Never   Smokeless tobacco: Never  Vaping Use   Vaping status: Never Used  Substance Use Topics   Alcohol use: Not Currently   Drug use: Not Currently    Types: Marijuana, Methylphenidate    Comment: quit 1997-1998 ish    Family History  Problem Relation Age of Onset   Prostate cancer Father 73   Cancer Brother 22       unsure type   Cancer Paternal Uncle        unsure type   Cancer Maternal Grandmother        unsure type   Cancer Paternal Grandmother        unsure type   Kidney cancer Paternal Grandfather    Cancer Other        unsure types   Leukemia Son    Cancer Son        other cancers, possibly colon    Allergies  Allergen Reactions   Penicillins Rash    Health Maintenance  Topic Date Due   DTaP/Tdap/Td (1 - Tdap) Never done   Zoster Vaccines- Shingrix (1 of 2) Never done   COVID-19 Vaccine (2 - Mixed Product risk series) 05/24/2021   Colonoscopy  08/01/2031   INFLUENZA VACCINE  Completed   Hepatitis C Screening  Completed   HIV Screening  Completed   HPV VACCINES  Aged Out    Objective: BP 101/68   Pulse 66   Resp 16   Ht 5\' 9"  (1.753 m)   Wt 155 lb (70.3 kg)   BMI  22.89 kg/m   Physical Exam Constitutional:      Appearance: cachectic male sitting in the wheelchair. HEENT wnl  HENT:     Head: Normocephalic and atraumatic.      Mouth: Mucous membranes are moist.  Eyes:    Conjunctiva/sclera: Conjunctivae normal.     Pupils:   Cardiovascular:     Rate and Rhythm: Normal rate and regular rhythm.     Heart sounds:   Pulmonary:     Effort: Pulmonary effort is normal.     Breath sounds:  Abdominal:     General: Non distended     Palpations: soft. Colostomy with soft stool, no surrounding cellulitis   Musculoskeletal:        General:  left BKA site has healed   Multiple DU in the rt leg and rt foot wrapped in a medication impregnated gauge and no similar dressing to reapply if removed, deferred removal as reported improvement per wound care note 11/26 and no new changes per patient.  Healing scabbed wound in the left hand  Neurological:     General:    Mental Status: awake, alert and oriented to person, place, and time.   Psychiatric:        Mood and Affect: frustrated  Lab Results Lab Results  Component Value Date   WBC 8.3 06/16/2023   HGB 10.5 (L) 06/16/2023   HCT 33.5 (L) 06/16/2023   MCV 81.5 06/16/2023   PLT 325 06/16/2023    Lab Results  Component Value Date   CREATININE 0.84 06/16/2023   BUN 23 06/16/2023   NA 138 06/16/2023   K 4.4 06/16/2023   CL 111 (H) 06/16/2023   CO2 17 (L) 06/16/2023    Lab Results  Component Value Date   ALT 13 05/15/2023   AST 25 05/15/2023   ALKPHOS 104 05/15/2023   BILITOT 0.4 05/15/2023    Lab Results  Component Value Date   TRIG 56 04/28/2018   No results found for: "LABRPR", "RPRTITER" No results found for: "HIV1RNAQUANT", "HIV1RNAVL", "CD4TABS"    Microbiology Results for orders placed or performed during the hospital encounter of 02/26/23  Blood culture (single)     Status: None   Collection Time: 02/26/23  3:37 PM   Specimen: BLOOD  Result Value Ref Range Status    Specimen Description BLOOD BLOOD RIGHT FOREARM  Final   Special Requests   Final    BOTTLES DRAWN AEROBIC AND ANAEROBIC Blood Culture results may not be optimal due to an inadequate volume of blood received in culture bottles   Culture   Final    NO GROWTH 5 DAYS Performed at [facility], [address], [place], [place] [address]    Report Status 03/03/2023 FINAL  Final  MRSA Next Gen by PCR, Nasal     Status: Abnormal   Collection Time: 02/28/23  4:30 AM   Specimen: Nasal Mucosa; Nasal Swab  Result Value Ref Range Status   MRSA by PCR Next Gen DETECTED (A) NOT DETECTED Final    Comment: RESULT CALLED TO, READ BACK BY AND VERIFIED WITH: SUSANA HERNANDEZ 02/28/2023 AT 0831 SRR (NOTE) The GeneXpert MRSA Assay (FDA approved for NASAL specimens only), is one component of a comprehensive MRSA colonization surveillance program. It is not intended to diagnose MRSA infection nor to guide or monitor treatment for MRSA infections. Test performance is not FDA approved in patients less than 34 years old. Performed at [facility], [address] [place], [place] [address]          Assessment/Plan # Chronic hepatitis C, treatment naive - Denies prior tx h/o - 05/2018 HCV RNA 5,29,000, genotype 1a - Labs today including US abdomen w elastography for tx planning - Fu in 2-3 weeks after [place] completed  # Multiple chronic venous/pressure ulcers in the rt leg, foot, heel, rt second toe - He has grown strep group G, strep viridans, providencia stuartii, MSSA, Group B strep, PsA from the leg wound in the past  - S/p completion of 14 days of levofloxacin started 9/23 for serratia growing in the molecular panel on 9/19 and off abtx currently  - Fu with wound care and vascular   I have personally spent  minutes involved in face-to-face and non-face-to-face activities for this patient on the day of the visit. Professional time spent includes the following activities: Preparing  to see the patient (review of tests), Obtaining and/or reviewing separately obtained history (admission/discharge record), Performing a medically appropriate examination and/or evaluation , Ordering  medications/tests/procedures, referring and communicating with other health care professionals, Documenting clinical information in the EMR, Independently interpreting results (not separately reported), Communicating results to the patient/family/caregiver, Counseling and educating the patient/family/caregiver and Care coordination (not separately reported).   Of note, portions of this note may have been created with voice recognition software. While this note has been edited for accuracy, occasional wrong-word or 'sound-a-like' substitutions may have occurred due to the inherent limitations of voice recognition software.   Victoriano Lain, MD [facility] 07/01/2023, 1:41 PM

## 2023-07-01 NOTE — Telephone Encounter (Signed)
Chad [name] spoke to [place] at [facility] and advised that patient is scheduled for Abd [place]  at [facility] on 07/08/23 at 9:15 AM NPO after midnight. Patient informed also and vocally repeated this plan. Return visit written on orders with ulttrasound information and was sent back to facility with patient.

## 2023-07-02 ENCOUNTER — Encounter: Payer: Self-pay | Admitting: Internal Medicine

## 2023-07-07 ENCOUNTER — Encounter: Payer: Self-pay | Admitting: Internal Medicine

## 2023-07-07 ENCOUNTER — Encounter: Payer: Medicaid Other | Attending: Physician Assistant | Admitting: Physician Assistant

## 2023-07-07 DIAGNOSIS — L89613 Pressure ulcer of right heel, stage 3: Secondary | ICD-10-CM | POA: Diagnosis not present

## 2023-07-07 DIAGNOSIS — L97518 Non-pressure chronic ulcer of other part of right foot with other specified severity: Secondary | ICD-10-CM | POA: Diagnosis not present

## 2023-07-07 DIAGNOSIS — I70245 Atherosclerosis of native arteries of left leg with ulceration of other part of foot: Secondary | ICD-10-CM | POA: Diagnosis not present

## 2023-07-07 DIAGNOSIS — I872 Venous insufficiency (chronic) (peripheral): Secondary | ICD-10-CM | POA: Insufficient documentation

## 2023-07-07 DIAGNOSIS — Z933 Colostomy status: Secondary | ICD-10-CM | POA: Insufficient documentation

## 2023-07-07 DIAGNOSIS — I87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 hypertension (idiopathic) with ulcer and inflammation of left lower extremity: Secondary | ICD-10-CM | POA: Diagnosis present

## 2023-07-07 DIAGNOSIS — L98492 Non-pressure chronic ulcer of skin of other sites with fat layer exposed: Secondary | ICD-10-CM | POA: Insufficient documentation

## 2023-07-07 DIAGNOSIS — Z89512 Acquired absence of left leg below knee: Secondary | ICD-10-CM | POA: Diagnosis not present

## 2023-07-07 DIAGNOSIS — J449 Chronic obstructive pulmonary disease, unspecified: Secondary | ICD-10-CM | POA: Insufficient documentation

## 2023-07-07 DIAGNOSIS — M1990 Unspecified osteoarthritis, unspecified site: Secondary | ICD-10-CM | POA: Diagnosis not present

## 2023-07-07 DIAGNOSIS — B1920 Unspecified viral hepatitis C without hepatic coma: Secondary | ICD-10-CM | POA: Diagnosis not present

## 2023-07-07 DIAGNOSIS — Z9221 Personal history of antineoplastic chemotherapy: Secondary | ICD-10-CM | POA: Diagnosis not present

## 2023-07-07 DIAGNOSIS — L97512 Non-pressure chronic ulcer of other part of right foot with fat layer exposed: Secondary | ICD-10-CM | POA: Insufficient documentation

## 2023-07-07 LAB — HEPATITIS B SURFACE ANTIBODY,QUALITATIVE: Hep B S Ab: NONREACTIVE

## 2023-07-07 LAB — PROTIME-INR
INR: 1
Prothrombin Time: 10.6 s (ref 9.0–11.5)

## 2023-07-07 LAB — LIVER FIBROSIS, FIBROTEST-ACTITEST
ALT: 10 U/L (ref 9–46)
Alpha-2-Macroglobulin: 196 mg/dL (ref 106–279)
Apolipoprotein A1: 135 mg/dL (ref 94–176)
Bilirubin: 0.2 mg/dL (ref 0.2–1.2)
Fibrosis Score: 0.18
GGT: 51 U/L (ref 3–70)
Haptoglobin: 217 mg/dL — ABNORMAL HIGH (ref 43–212)
Necroinflammat ACT Score: 0.02
Reference ID: 5238718

## 2023-07-07 LAB — COMPREHENSIVE METABOLIC PANEL
AG Ratio: 0.7 (calc) — ABNORMAL LOW (ref 1.0–2.5)
ALT: 8 U/L — ABNORMAL LOW (ref 9–46)
AST: 17 U/L (ref 10–35)
Albumin: 2.9 g/dL — ABNORMAL LOW (ref 3.6–5.1)
Alkaline phosphatase (APISO): 119 U/L (ref 35–144)
BUN: 21 mg/dL (ref 7–25)
CO2: 20 mmol/L (ref 20–32)
Calcium: 8.6 mg/dL (ref 8.6–10.3)
Chloride: 110 mmol/L (ref 98–110)
Creat: 1.07 mg/dL (ref 0.70–1.35)
Globulin: 4.1 g/dL — ABNORMAL HIGH (ref 1.9–3.7)
Glucose, Bld: 116 mg/dL — ABNORMAL HIGH (ref 65–99)
Potassium: 4.1 mmol/L (ref 3.5–5.3)
Sodium: 137 mmol/L (ref 135–146)
Total Bilirubin: 0.2 mg/dL (ref 0.2–1.2)
Total Protein: 7 g/dL (ref 6.1–8.1)

## 2023-07-07 LAB — CBC
HCT: 32.4 % — ABNORMAL LOW (ref 38.5–50.0)
Hemoglobin: 9.9 g/dL — ABNORMAL LOW (ref 13.2–17.1)
MCH: 25.1 pg — ABNORMAL LOW (ref 27.0–33.0)
MCHC: 30.6 g/dL — ABNORMAL LOW (ref 32.0–36.0)
MCV: 82.2 fL (ref 80.0–100.0)
MPV: 10.1 fL (ref 7.5–12.5)
Platelets: 271 10*3/uL (ref 140–400)
RBC: 3.94 10*6/uL — ABNORMAL LOW (ref 4.20–5.80)
RDW: 14.6 % (ref 11.0–15.0)
WBC: 6.9 10*3/uL (ref 3.8–10.8)

## 2023-07-07 LAB — HEPATITIS B SURFACE ANTIGEN: Hepatitis B Surface Ag: NONREACTIVE

## 2023-07-07 LAB — HEPATITIS C GENOTYPE

## 2023-07-07 LAB — HEPATITIS A ANTIBODY, TOTAL: Hepatitis A AB,Total: NONREACTIVE

## 2023-07-07 LAB — HEPATITIS B CORE ANTIBODY, TOTAL: Hep B Core Total Ab: NONREACTIVE

## 2023-07-08 ENCOUNTER — Ambulatory Visit
Admission: RE | Admit: 2023-07-08 | Discharge: 2023-07-08 | Disposition: A | Discharge status: Home / Self Care | Payer: Medicaid Other | Source: Ambulatory Visit | Attending: Infectious Diseases | Admitting: Infectious Diseases

## 2023-07-08 DIAGNOSIS — B182 Chronic viral hepatitis C: Secondary | ICD-10-CM | POA: Diagnosis present

## 2023-07-09 NOTE — Progress Notes (Signed)
KEAGIN, ARNTZEN ([id number]) [id number] Page 1 of 17 Visit Report for 07/07/2023 Chief Complaint Document Details Patient Name: Date of Service: Chad [name], Chad [name] 07/07/2023 10:15 A M Medical Record Number: [id number] Patient Account Number: [id number] Date of Birth/Sex: Treating RN: 1961-02-03 (62 y.o. [name] Primary Care Provider: [name] Other Clinician: Referring Provider: Treating Provider/Extender: [name] Weeks in Treatment: 67 Information Obtained from: Patient Chief Complaint Left ankle ulcer, Left dorsal foot wound, right heel Electronic Signature(s) Signed: 07/07/2023 1:46:46 PM By: [name] PA-C Entered By: [name] on 07/07/2023 13:46:45 -------------------------------------------------------------------------------- HPI Details Patient Name: Date of Service: Chad [name] 07/07/2023 10:15 A M Medical Record Number: [id number] Patient Account Number: [id number] Date of Birth/Sex: Treating RN: 14-Dec-1960 (62 y.o. [name] Primary Care Provider: [name] Other Clinician: Referring Provider: Treating Provider/Extender: [name] in Treatment: 62 History of Present Illness HPI Description: 10/08/18 on evaluation today patient actually presents to our office for initial evaluation concerning wounds that he has of the bilateral lower extremities. 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 He also has bilateral venous stasis. He apparently has been homeless currently following discharge from the hospital apparently he has been placed at [facility] which is is a skilled nursing facility locally. Nonetheless fortunately he does not show any signs of infection at this time which is good news. In fact several of the  wound actually appears to be showing some signs of improvement already in my pinion. There are a couple areas in the left leg in particular there likely gonna require some sharp debridement to help clear away some necrotic tissue and help with more sufficient healing. No fevers, chills, nausea, or vomiting noted at this time. 10/15/18 on evaluation today patient actually appears to be doing very well in regard to his bilateral lower extremities. He's been tolerating the dressing changes without complication. Fortunately there does not appear to be any evidence of active infection at this time which is great news. Overall I'm actually very pleased with how this has progressed in just one visits time. Readmission: 08/14/2020 upon evaluation today patient presents for re-evaluation here in our clinic. He is having issues with his left ankle region as well as his right toe and his right heel. He tells me that the toe and heel actually began as a area that was itching that he was scratching and then subsequently opened up into wounds. These may have been abscess areas I presume based on what I am seeing currently. With regard to his left ankle region he tells me this was a similar type Chad [name], Chad [name] ([id number]) [id number] Page 2 of 17 occurrence although he does have venous stasis this very well may be more of a venous leg ulcer more than anything. Nonetheless I do believe that the patient would benefit from appropriate and aggressive wound care to try to help get things under better control here. He does have history of a stroke on the left side affecting him to some degree there that he is able to stand although he does have some residual weakness. Otherwise again the patient does have chronic venous insufficiency as previously noted. His arterial studies most recently obtained showed that he had an ABI on the right of 1.16 with a TBI of 0.52 and on the left and ABI of 1.14  with a TBI of 0.81. That was obtained on 06/19/2020. 08/28/2020 upon evaluation today patient appears to be doing decently well in regard to his wounds in general. He has been tolerating the dressing changes without complication. Fortunately there does not appear to be any signs of active infection which is great news. With that being said I think the [facility] is doing a good job I would recommend that we likely continue with that currently. 09/11/2020 upon evaluation today patient's wounds did not appear to be doing too poorly but again he is not really showing signs of significant improvement with regard to any of the wounds on the right. None of them have Hydrofera Blue on them I am not exactly sure why this is not being followed as the facility did not contact us to let us know of any issues with obtaining dressings or otherwise. With that being said he is supposed to be using Hydrofera Blue on both of the wounds on the right foot as well as the ankle wound on the left side. 09/18/2020 upon evaluation today patient appears to be doing poorly with regard to his wounds. Again right now the left ankle in particular showed signs of extreme maceration. Apparently he was told by someone with staff at [facility] they could not get the [facility]. With that being said this is something that is never been relayed to [place] one way or another. Also the patient subsequently has not supposed to have a border gauze dressing on. He should have an ABD pad and roll gauze to secure as this drains much too much just to have a border gauze dressing to cover. Nonetheless the fact that they are not using the appropriate dressing is directly causing deterioration of the left ankle wound it is significantly worse today compared to what it was previous. I did attempt to call [facility] while the patient was here I called three times and got no one to even pick up the phone. After this I had my for an  office coordinator call and she was able to finally get through and leave a message with the D ON as of dictation of this note which is roughly about an hour and a half later I still have not been able to speak with anyone at the facility. 09/25/2020 upon evaluation today patient actually showing signs of good improvement which is excellent news. He has been tolerating the dressing changes without complication. Fortunately there is no signs of active infection which is great news. No fevers, chills, nausea, vomiting, or diarrhea. I do feel like the facility has been doing a much better job at taking care of him as far as the dressings are concerned. However the director of nursing never did call me back. 10/09/2020 upon evaluation today patient appears to be doing well with regard to his wound. The toe ulcer did require some debridement but the other 2 areas actually appear to be doing quite well. 10/19/2020 upon evaluation today patient actually appears to be doing very well in regard to his wounds. In fact the heel does appear to be completely healed. The toe is doing better in the medial ankle on the left is also doing better. Overall I think he is headed in the right direction. 10/26/2020 upon evaluation today patient appears to be doing well with regard to his wound. He is showing signs of improvement which is great news and overall I am very pleased with where things stand today. No  fevers, chills, nausea, vomiting, or diarrhea. 11/02/2020 upon evaluation today patient appears to be doing well with regard to his wounds. He has been tolerating the dressing changes without complication overall I am extremely pleased with where things stand today. He in regard to the toe is almost completely healed and the medial ankle on the left is doing much better. 11/09/2020 upon evaluation today patient appears to be doing a little poorly in regard to his left medial ankle ulcer. Fortunately there does not appear to  be any signs of systemic infection but unfortunately locally he does appear to be infected in fact he has blue-green drainage consistent with Pseudomonas. 11/16/2020 upon evaluation today patient appears to be doing well with regard to his wound. It actually appears to be doing better. I did place him on gentamicin cream since the Cipro was actually resistant even though he was positive for Pseudomonas on culture. Overall I think that he does seem to be doing better though I am unsure whether or not they have actually been putting the cream on. The patient is not sure that we did talk to the nurse directly and she was going to initiate that treatment. Fortunately there does not appear to be any signs of active infection at this time. No fevers, chills, nausea, vomiting, or diarrhea. 4/28; the area on the right second toe is close to healed. Left medial ankle required debridement 12/07/2020 upon evaluation today patient appears to be doing well with regard to his wounds. In fact the right second toe appears to be completely healed which is great news. Fortunately there does not appear to be any signs of active infection at this time which is also great news. I think we can probably discontinue the gentamicin on top of everything else. 12/14/2020 upon evaluation today patient appears to be doing well with regard to his wound. He is making good progress and overall very pleased with where things stand today. There is no signs of active infection at this time which is great news. 12/28/2020 upon evaluation today patient appears to be doing well with regard to his wounds. He has been tolerating the dressing changes without complication. Fortunately there is no signs of active infection at this time. No fevers, chills, nausea, vomiting, or diarrhea. 12/28/2020 upon evaluation today patient's wound bed actually showed signs of excellent improvement. He has great epithelization and granulation I do not see any  signs of infection overall I am extremely pleased with where things stand at this point. No fevers, chills, nausea, vomiting, or diarrhea. 01/11/2021 upon evaluation today patient appears to be doing well with regard to his wound on his leg. He has been tolerating the dressing changes without complication. Fortunately there does not appear to be any signs of active infection which is great news. No fevers, chills, nausea, vomiting, or diarrhea. 01/25/2021 upon evaluation today patient appears to be doing well with regard to his wound. He has been tolerating the dressing changes without complication. Fortunately the collagen seems to be doing a great job which is excellent news. No fevers, chills, nausea, vomiting, or diarrhea. 02/08/2021 upon evaluation today patient's wound is actually looking a little bit worse especially in the periwound compared to previous. Fortunately there does not appear to be any signs of infection which is great news with that being said he does have some irritation around the periphery of the wound which has me more concerned. He actually had a dressing on that had not been changed in 3  days. He also is supposed to have daily dressing changes. With regard to the dressing applied he had a silver alginate dressing and silver collagen is what is recommended and ordered. He also had no Desitin around the edges of the wound in the periwound region although that is on the order inspect to be done as well. In general I was very concerned I did contact [facility] actually spoke with Verlon Au who is the scheduling individual and subsequently she stated that she would pass the information to the D ON apparently the D ON was not available to talk to me when I call today. 02/18/2021 upon evaluation today patient's wound is actually showing signs of improvement. Fortunately there does not appear to be any evidence of infection which is great news overall I am extremely pleased with  where things stand today. No fevers, chills, nausea, vomiting, or diarrhea. 8/3; patient presents for 1 week follow-up. He has no issues or complaints today. He denies signs of infection. 03/11/2021 upon evaluation today patient appears to be doing well with regard to his wound. He does have a little bit of slough noted on the surface of the wound but fortunately there does not appear to be any signs of active infection at this time. No fevers, chills, nausea, vomiting, or diarrhea. 03/18/2021 upon evaluation today patient appears to be doing well with regard to his wound. He has been tolerating the dressing changes without complication. There was a little irritation more proximal to where the wound was that was not noted last week but nonetheless this is very superficial just seems to be more irritation we just need to make sure to put a good amount of the zinc over the area in my opinion. Otherwise he does not seem to be doing significantly worse at all which is great news. 03/25/2021 upon evaluation today patient appears to be doing well with regard to his wound. He is going require some sharp debridement today to clear with Chad [name], Chad [name] ([id number]) [id number] Page 3 of 17 some of the necrotic debris. I did perform this today without complication postdebridement wound bed appears to be doing much better this is great news. 04/08/2021 upon evaluation today patient appears to be doing decently well in regard to his wound although the overall measurement is not significantly smaller compared to previous. It is gone down a little bit but still the facility continues to not really put the appropriate dressings in place in fact he was supposed to have collagen we think he probably had more of an allergy to At this point. Fortunately there does not appear to be any signs of active infection systemically though locally I do not see anything on initial visualization either as  far as erythema or warmth. 04/15/2021 upon evaluation today patient appears to be doing well with regard to his wound. He is actually showing signs of improvement. I did place him on antibiotics last week, Cipro. He has been taking that 2 times a day and seems to be tolerating it very well. I do not see any evidence of worsening and in fact the overall appearance of the wound is smaller today which is also great news. 9/26; left medial ankle chronic venous insufficiency wound is improved. Using Hydrofera Blue 10/10; left medial ankle chronic venous insufficiency. Wound has not changed much in appearance completely nonviable surface. Apparently there have been problems getting the right product on the wound at the facility although he came in with [facility] on today  05/14/2021 upon evaluation today patient appears to be doing well with regard to his wound. I think he is making progress here which is good news. Fortunately there does not appear to be any signs of active infection at this time. No fevers, chills, nausea, vomiting, or diarrhea. 05/20/2021 upon evaluation today patient appears to be doing well with regard to his wound. He is showing signs of good improvement which is great news. There does not appear to be any evidence of active infection which is also excellent news. No fevers, chills, nausea, vomiting, or diarrhea. 05/28/2021 upon evaluation today patient appears to be doing quite well. There does not appear to be any signs of active infection at this time which is great news. Overall I am extremely pleased with where things stand today. I think he is headed in the right direction. 06/11/2021 upon evaluation today patient appears to be doing well with regard to his left ankle ulcer and poorly in regard to the toe ulcer on the second toe right foot. This appears to show signs of joint exposure. Apparently this has been present for 1 to 2 months although he kept forgetting to tell me  about it. That is unfortunate as right now it definitely appears to be doing significantly worse than what I would like to see. There does not appear to be any signs of active infection systemically though locally I am concerned about the possibility of infection the toe is quite red. Again no one from the facility ever contacted [place] to advise that this was going on in the interim either. 06/17/2021 upon evaluation today patient presents for follow-up I did review his x-ray which showed a navicular bone fracture I am unsure of the chronicity of this. Subsequently he also had osteomyelitis of the toe which was what I was more concerned about this did not show up on x-ray but did show up on the pathology scrapings. This was listed as acute osteomyelitis. Nonetheless at this point I think that the antibiotic treatment is the best regimen to go with currently. The patient is in agreement with that plan. Nonetheless he has initially 30 days of doxycycline off likely extend that towards the end of the treatment cycle that will be around the middle of December for an additional 2 weeks. That all depends on how well he continues to heal. Nonetheless based on what I am seeing in the foot I did want a proceed with an MRI as well which I think will be helpful to identify if there is anything else that needs to be addressed from the standpoint of infection. 06/24/2021 upon evaluation today patient appears to be doing pretty well in regards to his wounds. I think both are actually showing signs of improvement which is good I did review his MRI today which did show signs of osteomyelitis of the middle and proximal phalanx on his right foot of the affected toe. With that being said this is actually showing signs of significant improvement today already with the antibiotic therapy I think the redness is also improved. Overall I think that we just need to give this some time with appropriate wound care we will see how  things go potentially hyperbarics could be considered. 07/02/2021 upon inspection today patient actually appears to be doing well in regard to his left ankle which is getting very close to complete resolution of pleased in that regard. Unfortunately he is continuing to have issues with his second toe right foot and this seems  to still be very painful for him. Recommend he try something different from the standpoint of antibiotics. 07/15/2021 upon evaluation today patient appears to be doing actually pretty well in regard to his foot. This is actually showing signs of significant improvement which is great news. Overall I feel like the patient is improving both in regard to the second toe as well as the ankle on the left. With that being said the biggest issue that I do see currently is that he is needing to have a refill of the doxycycline that we previously treated him with. He also did see podiatry they are not going to recommend any amputation at this point since he seems to be doing quite well. For that reason we just need to keep things under control from an infection standpoint. 08/01/2021 upon evaluation today patient appears to be doing well with regard to his wound. He has been tolerating the dressing changes without complication. Fortunately there does not appear to be any evidence of active infection locally nor systemically at this point. In fact I think everything is doing excellent in fact his second toe on the right foot is almost healed and the ankle on the left ankle region is actually very close to being healed as well. 08/08/2021 upon evaluation today patient appears to be doing well with regard to his wound. He has been tolerating the dressing changes without complication. Fortunately I do not see any signs of active infection at this time. Readmission: 12-06-2021 upon evaluation today patient presents for reevaluation here in the clinic he does tell me that he was being seen in  facility at [facility] by a provider that was coming in. He is not sure who this was. He tells me however that the wound seems to have gotten worse even compared to where it was when we last saw him at this point. With that being said I do believe that he is likely going need ongoing wound care here in the clinic and I do believe that we need to be the ones to frontline this since his wound does seem to be getting worse not better at this point. He voiced understanding. He is also in agreement with this plan and feels more comfortable coming here she tells me. Patient's medical history really has not changed since his prior admission he was only gone since January. 12-27-2021 upon evaluation today patient appears to be doing well with regard to his wound they did run out of the [facility] so they did not put anything on just an ABD pad with gentamicin. Still we are seeing some signs of good improvement here with some new epithelization which is great news. 01-10-2022 upon evaluation today patient appears to be doing well with regard to his wounds and he is going require some sharp debridement but overall seems to be making good progress. Fortunately I do not see any evidence of active infection locally or systemically at this time which is great news. 01-24-2022 upon evaluation today patient appears to be doing well with regard to his wound. The facility actually came and dropped him off early and he had another appointment at the hospital and then they just brought him over here and this was still hours before his appointment this afternoon. For that reason we did do our best to work him in this morning and fortunately had some space to make this happen. With that being said patient's wound does seem to be making progress here and I  am very pleased in that regard I do not see any signs of active infection locally or systemically at this time. 02-07-2022 patient appears to be doing well  currently in regard to his wounds. In fact one of them the more proximal is healed the distal is still open but seems to be doing excellent. Fortunately I do not see any evidence of active infection locally or systemically at this time which is great news. No fevers, chills, nausea, vomiting, or diarrhea. 7/27; left medial ankle venous. Improving per our intake nurse. We are using Hydrofera Blue under Tubigrip compression. They are changing that at his facility 03-06-2022 upon evaluation today patient appears to be doing well with regard to the his wound he is can require some sharp debridement but seems to be making excellent progress. Fortunately I do not see any evidence of active infection locally or systemically which is great news. 03-27-2022 upon evaluation today patient's wound is actually showing signs of excellent improvement. Fortunately I see no signs of active infection locally or systemically at this time which is great news. No fevers, chills, nausea, vomiting, or diarrhea. Chad [name], Chad [name] ([id number]) [id number] Page 4 of 17 04-21-2022 upon evaluation today patient appears to be doing excellent in regard to his wound in fact this is very close to resolution based on what I am seeing. I do not see any evidence of active infection locally or systemically at this time which is great news and overall I am extremely pleased with where we are today. 05-05-2022 upon evaluation today patient's wound actually appears to potentially be completely healed. Fortunately I do not see any evidence of active infection at this time which is great news and overall I am very pleased I think this needs a little time to toughen up but other than that I really do believe were doing quite well. He is very pleased to hear this its been a long time coming. 05-19-2022 upon evaluation today patient appears to be doing well currently in regard to his wound. He in fact we were hoping will  be completely healed and closed but it still has a very tiny area right in the center which is still continuing to drain. Fortunately I do not see any signs of infection locally or systemically which is great news. 11/6; this wound is close to completely" he comes in this week with some erythema at 1 edge of this which looks like something was rubbing on the area. Other than that no evidence of infection 06-16-2022 upon evaluation today patient appears to be doing well currently in regard to his ankle ulcer. This is very close to being healed but still has a small area in the central portion which is not. Fortunately there does not appear to be any signs of infection locally or systemically which is great news. No fevers, chills, nausea, vomiting, or diarrhea. 06-30-2022 upon evaluation patient's wound pretty much appears to be almost completely closed. In fact it may even be closed but there are still a lot of skin irritation around the edges of the wound and I just do not feel great about releasing them yet I would like to continue to monitor this just a little bit longer before getting to that point. He is not opposed to this and in fact is happy to continue to come in if need be in order to make sure that things are moving in the right direction he definitely does not want to backtrack. 07-14-2022 upon evaluation today patient  appears to be doing well currently in regard to his wound. Has been tolerating the dressing changes without complication. Fortunately I see no evidence of active infection locally nor systemically which is great news. No fevers, chills, nausea, vomiting, or diarrhea. 08-11-2022 upon evaluation today patient appears to be doing a little worse than last time I saw him although it has been a month. Unfortunately he was not able to be seen due to the fact that he was quarantined in the facility with COVID. Subsequently he tells me that he mention to the facility staff several  times about taking it off over the past several weeks but they continue to tell him that someone have to get in touch with [place] here although nobody ever did until Friday when the nurse manager called to have that documented in the communication notes. With that being said unfortunately this means that he had a wrap on that he was supposed to have on for only 1 week for ended up being on four 1 month essentially. I am glad that there is nothing worse than what we see currently to be perfectly honest. 08-19-2022 upon evaluation today patient appears to be doing well currently in regard to his wounds. He has been tolerating the dressing changes without complication. This is actually smaller today which is great news after last week's reopening. Fortunately I do not see any evidence of infection locally nor systemically at this point. 08-26-2022 upon evaluation today patient appears to be doing well currently in regard to his wound. He has been tolerating the dressing changes without complication. Fortunately there does not appear to be any signs of active infection locally nor systemically which is great news. No fevers, chills, nausea, vomiting, or diarrhea. 09-02-2022 upon evaluation today patient's wound actually showing signs of excellent improvement this is measuring smaller and looking much better. I am very pleased with where we stand I do believe that we are headed in the right direction. 09-09-2022 upon evaluation today patient appears to be doing decently well in regard to his leg in general although there was some swelling this is the 1 thing that I am not too happy with based on what I see currently. Fortunately there does not appear to be any signs of infection locally nor systemically at this point. 09-16-2022 upon evaluation today patient appears to be doing well currently in regard to his wound. He is actually showing signs of improvement he wore the Tubigrip this week and it significantly  better compared to last week's evaluation. Fortunately I do not see any evidence of active infection locally or systemically which is great news. Overall I think that were headed in the right direction which is great news. In fact overall I think this is the best that he has looked in some time. He does not like the Tubigrip but actually did extremely well with this in my opinion. I think he needs to continue to utilize this. 09-23-2022 upon evaluation today patient appears to be doing well currently in regard to his wound. He has been tolerating the dressing changes without complication. Fortunately there does not appear to be any signs of active infection locally nor systemically which is great news and in general I do feel like that we are headed in the right direction. He has been having trouble with the Tubigrip however we have gotten the facility to order compression socks he has not gotten them as of yet. 3/4; patient is about the same with a wound  on the left posterior leg. This is a venous wound. He needs more compression on this leg but he took off the previous wraps we are using. I am not certain whether he is using juxta lite stockings at the nursing home. 3/12; patient presents for follow-up. He unfortunately has 2 new wounds 1 to the dorsal left side and the other to the right heel. He is not sure how these happened. The right heel appears to be caused by pressure. He has been using Hydrofera Blue to the original wound on the left leg. 10-14-2022 upon evaluation today patient appears to be doing well currently in regard to his original wound but unfortunately he has several new wounds which I was completely unaware of before walking into the room to see him today. Unfortunately he seems to not be doing nearly as well as what I saw when I last had an appointment with him 3 weeks ago. Fortunately I do not see any signs of systemic infection though unfortunately locally he has definite  infection of the left lower extremity. He is currently on doxycycline I am going to likely have to extend that today. 10-21-2022 upon evaluation today patient appears to be doing well currently in regard to his wounds. He seems to be tolerating the dressing changes without complication. Fortunately there does not appear to be any signs of active infection locally or systemically which is great news and in general I definitely feel like we are on the right track here. 10-30-2022 upon evaluation today patient still continues to not be doing too well at all with regard to his wounds. Unfortunately he is still having signs of infection as well which also has been concerned. Fortunately I do not see any signs of active infection locally nor systemically at this time. No fevers, chills, nausea, vomiting, or diarrhea. 11-06-2022 upon evaluation today patient appears to be doing okay currently in regard to his wounds. All things considered with his blood flow not being nearly as good as what should I think that he is making pretty good progress here. Fortunately I do not see any signs of infection locally nor systemically which is great news. No fevers, chills, nausea, vomiting, or diarrhea. 11-13-2022 upon evaluation today patient appears to be doing poorly currently in regard to his wounds. Things seem to be getting a little bit worse not better. 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 Subsequently he is also going to need to have vascular intervention but we are unsure exactly when this is going on the undertaking. I would try to actually send a message to Dr. Hazle Quant and the vascular team just to see what exactly the plan is going forward. The patient is in agreement with that plan I just think we need to have a plan especially in light of the fact the left leg seems to be getting a little bit worse at this  point. 11-25-2022 upon evaluation today patient is currently now discharged from the hospital following his colostomy procedure. That did very well and he seems to [facility] ([id number]) [id number] Page 5 of 17 be doing well. Fortunately there does not appear to be any signs of active infection locally or systemically which is great news. No fevers, chills, nausea, vomiting, or diarrhea. 12-04-2022 upon evaluation today patient appears to be doing actually decently well in regard to his wounds and actually very pleased  with where we stand and I think that he is making some pretty good progress here. Fortunately I do not see any signs of active infection locally nor systemically which is great news. No fevers, chills, nausea, vomiting, or diarrhea. 12-12-2022 upon evaluation today patient appears to be doing well currently in regard to his wound. He has been tolerating the dressing changes without complication. The wounds all seem to be making some progress to a degree. He has appointment with vascular on the 23rd of this month. 5/24; the patient was scheduled for a left lower extremity angiogram yesterday but apparently they were overbooked and they wanted to admit him to hospital last night so they could do this early this morning but he refused I see that Dr. Wyn Quaker has rescheduled him for June 6. He is using silver alginate to wounds on his bilateral lower extremities. 01-02-2023 upon evaluation today patient appears to be doing poorly in regard to his wounds still. Subsequently he did have his angiogram but apparently there really was not much that was found that could be revascularized. The patient tells me that Dr. Wyn Quaker was going to contact me here in the office I have not heard from him as of yet and I may see about giving him a call just to follow-up or possibly send a message through epic which could be an option as well. 01-09-2023 upon evaluation today patient  appears to be doing poorly in regard to his legs he is extremely swollen. He has been sleeping in his chair he tells me for the past week. His bed is broken and they have ordered a new one which is supposedly supposed to be there today. With that being said he the way he needs to be not sleeping in his chair that is his wheelchair every day and through the night. This is causing his legs to be much more swollen and it seen and how badly wounds are doing at this point. This has made things significantly worse. Fortunately I do not see any signs of infection right now unfortunately I do believe that the patient has a tremendous amount of drainage currently. 01-19-2023 upon evaluation today patient appears to be doing poorly in regard to his wounds of his legs his swelling is better but still not as good as what we would like to see. Fortunately I do not see any evidence of active infection locally nor systemically which is great news and in general I do believe that we are moving in the right direction here. Nonetheless it would be better if she was in the bed sleeping or not send up in his wheelchair although he seems to still be set up in the wheelchair at this time. He tells me that he is just not as comfortable in the bed as the wheelchair but unfortunately this is causing damage to his legs which I discussed with him today as well. Nonetheless I am not sure that he is going to be amendable to doing anything different. 01-27-2023 upon evaluation today patient appears to be doing well currently. Fortunately I do not see any evidence of active infection locally nor systemically which is great news I think he is actually doing the best option and while in fact I would try to see if we can do some debridement today to get things moving along. 02-03-2023 upon evaluation today patient's wounds unfortunately are not doing nearly as well. Will be doing the [employer] wraps unfortunately we just not seeing the  improvement that I would like to see. Fortunately I do not see any evidence of infection at this time which is good news unfortunately I do think that he needs to be changed on a daily basis due to the amount of drainage otherwise I do not think this is really going to continue to improve dramatically. 02-12-2023 upon evaluation today patient appears to be doing better in a lot of ways in regard to his wounds and although his toes on the left side actually seem a bit worse. Fortunately I do not see any evidence of active infection locally nor systemically at this time which is good news. With that being said he is having some bleeding from the left leg I think that this is an area that looks like it probably got bumped. With that being said I discussed this with the patient he tells me he does not know of any trauma that occurred but at the same time if was not this that it was skin that got pulled off. Dressing change there is only one of the 2 that could have happened. 7/25; this is a patient who is very frail at [facility] nursing facility. He is nonambulatory. He has wounds on his bilateral lower legs. However much worse on the left we have been using silver cell to all of his wounds He comes in today and a lot more pain our intake nurse reports deterioration of the left foot. I ended up looking through some of his record. 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 I am really not certain of the stage of  this currently or who is following 02-26-2023 upon evaluation today patient unfortunately appears to be doing significantly worse compared to where he was previous. The left foot is showing signs of dramatic worsening in regard to the overall appearance his toes are starting to become necrotic almost in full and he has much more cyanosis present than what I even seen previous. He is already seeing Dr. Wyn Quaker and unfortunately there was not anything that they saw that was an immediate cause for this but at the same time I feel like this may have changed and even worsened I feel like this could be a vascular event and I believe this is a limb-threatening situation. 03/26/2023; I last saw this patient on 8/1. Wondered about below-knee amputations versus palliative care. 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 The wound has healed fairly well and he follows up with surgery today. He is back today looking at wounds on the right foot and lower leg. This includes 2 areas on the right heel, right dorsal foot right dorsal first and second toes and a small area on the right anterior lower tibia area. He has a lot of pain in this leg as well. The angiogram he had on 01/01/2023 specific to the right leg now demonstrated a fairly normal common femoral artery, profunda femoris artery and superficial and popliteal arteries without significant disease. 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 No doubt this has a lot to do with the discomfort in the right leg he describes when he is in bed. They have been using Xeroform on the wounds on the right. He has a lot of swelling in this leg with weeping edema 04-09-2023 upon evaluation today patient appears to be doing poorly still in regard to his right leg. 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  Fortunately I do not see any signs of active infection locally or systemically at this time which is great news. No fevers, chills, nausea, vomiting, or diarrhea. With that being said I am concerned still about the fact that he seems to be getting worse especially in regard to the heels I think that he is still sitting up most of the day which I think is still causing some swelling of his leg unfortunately. 04-16-2023 upon evaluation today patient actually appears to be doing little bit more poorly compared to last week this actually does appear to be infected. Last week I really did not think that was the case but this seems like it may indeed be the case. Fortunately I do not see any signs of active infection locally or systemically at this time. 04-24-2023 upon evaluation today patient appears to be doing well currently in regard to his leg compared to where we were previous I do feel like that the antibiotics have been of benefit. Fortunately I do not see any signs of active infection at this time which is great news and in general I believe that we are making headway towards healing which is great news as well. With that being said he still has a lot of drainage I really feel like something like Anola Gurney would be better for him than just an ABD pad it would likely the drainage more or even Zetuvit's but I am not sure whether we can get either 1 of these at the facility for him. 05-01-2023 upon evaluation today patient appears to be doing well currently in regard to his wounds which are showing signs of significant improvement is not any way shape or form infected like it was last time I saw him. I think the antibiotics are doing a good job but I think he may need an extension here to ensure Chad [name], Chad [name] ([id number]) [id number] Page 6 of 17 that it completely clears. 05-08-2023 upon evaluation today patient appears to be doing well currently in regard to his wounds for  the most part. He still has blue-green drainage noted but in general seems to be doing much better compared to where we were previous. Fortunately I do not see any signs of active infection locally or systemically which is great news and in general I do believe that we are making good headway towards complete closure. 05-18-2023 upon evaluation today patient appears to be doing well currently in regard to his wounds I feel like he is definitely making some improvements here. This is a slow improvement but nonetheless improvement. I do believe that he is doing quite well as far as infection is concerned which is also good news. 05-25-2023 upon evaluation today patient actually appears to be doing worse in regard to his legs at this point. Unfortunately he continues to have issues with significant drainage which is the primary concern here. I do feel like that the wound currently is not doing a whole lot better  when he was on the Levaquin I felt like it was improving some but the concern there is QT prolongation I do not want to continue that personally further I think he needs a referral ASAP to infectious disease. I discussed that with him today. He also needs to be having this changed every day and that has not been happening at this point. That is my biggest concern here. 06-09-2023 upon evaluation today patient appears to be doing still poorly in regard to this right leg which is draining profusely. This is unfortunately just continuing to show signs of being very moist and wet and weeping quite significantly. This is despite everything that we are doing including the facility have been placed him on Levaquin by ID which unfortunately just does not seem to be helping to heal this as well as we would hope either. I do have an infectious disease appointment that we sent 10 for him and that is actually scheduled for next week on the 19th. He has an appointment with vascular on the 14th of this week  although I think they are looking at his left leg primarily at the amputation site. 06-17-2023 upon evaluation today patient appears to be doing still poorly in regard to his legs at this point. Unfortunately he still seems to be having issues with significant drainage we are having this changed every single day. With that being said despite this the patient just does not seem to be making a lot of headway here towards closure. We are trying to do what we can from the standpoint of compression as well. He did see infectious disease yesterday. I did review that note as well. Upon evaluation the note actually states that the patient told the physician "do I have to keep suffering with wounds forevero Is there a way to get rid of these woundso However he did not want a referral to see Dr. Lajoyce Corners for potential amputation which seems to be 1 option that was proposed. With that being said he also apparently told her that he was "adamant about not doing another course of antibiotics as he feels like they have not healed his wounds". It appears based on evaluation with regard to the record that the patient did have a discussion with her about the possibility of doing a short course of IV cefepime for 14 days to cover [place] as well as the other bacteria noted on culture. However the patient declined this as he felt like the antibiotics in the past he had taken did not help at all and there were no signs and symptoms to suggest sepsis. Therefore she recommended continuing wound care and to continue to follow-up with vascular follow-up in 2 weeks with her. I appreciate this evaluation and again I cannot do anything with the fact that the patient did not want to proceed with any further treatment with regard to the bacterial infection. 06-23-2023 the patient seems to making some progress here in regard to his wound. I feel like it is still a tremendous amount of drainage but at the same time I feel like you are  also showing some signs of significant improvement which is great news and very pleased with where things stand currently. Especially compared to where we have been. 07-07-2023 upon evaluation today patient appears to be doing about the same currently in regard to his wounds. He does not seem quite as erythematous as he has been around the wounds but the wounds continue to get larger due  to the swelling and the drainage. Again I think the moisture is causing additional and further breakdown which is unfortunate. With that being said I discussed with the patient that I do believe that the only way to get this better is going to be to keep the swelling under control and the drainage off of his skin. This is the hardest part to be sure. I think the alginate dressing is being used at the facility is actually keeping things more moist which is not ideal. Electronic Signature(s) Signed: 07/08/2023 7:37:45 PM By: [name] PA-C Entered By: [name] on 07/08/2023 19:37:45 -------------------------------------------------------------------------------- Physical Exam Details Patient Name: Date of Service: Chad [name], Chad [name] 07/07/2023 10:15 A M Medical Record Number: [id number] Patient Account Number: [id number] Date of Birth/Sex: Treating RN: 1960-08-24 (62 y.o. [name] Primary Care Provider: [name] Other Clinician: Referring Provider: Treating Provider/Extender: [name] Weeks in Treatment: 76 Constitutional Well-nourished and well-hydrated in no acute distress. Respiratory normal breathing without difficulty. Psychiatric this patient is able to make decisions and demonstrates good insight into disease process. Alert and Oriented x 3. pleasant and cooperative. Notes Upon inspection patient's wound bed actually showed signs of pretty good granulation epithelization at this point. However there was some slough and biofilm he is having a lot of discomfort and again  the areas are so large that debridement is really not possible due to the significant nature of the wounds. DIAMONTE, TUGMAN ([id number]) [id number] Page 7 of 17 Electronic Signature(s) Signed: 07/08/2023 7:38:02 PM By: [name] PA-C Entered By: [name] on 07/08/2023 19:38:02 -------------------------------------------------------------------------------- Physician Orders Details Patient Name: Date of Service: Chad [name], Chad [name] 07/07/2023 10:15 A M Medical Record Number: [id number] Patient Account Number: [id number] Date of Birth/Sex: Treating RN: 1961-03-23 (62 y.o. [name] Primary Care Provider: [name] Other Clinician: Referring Provider: Treating Provider/Extender: [name] in Treatment: 88 The following information was scribed by: Midge Aver The information was scribed for: [name] Verbal / Phone Orders: No Diagnosis Coding Follow-up Appointments Return Appointment in 1 week. Nurse Visit as needed Bathing/ [employer] wounds with antibacterial soap and water. - Leave off the calcium alginate, Xtra sorb only except, place hydrofera blue strips between the toes No tub bath. Anesthetic (Use 'Patient Medications' Section for Anesthetic Order Entry) Lidocaine applied to wound bed Off-Loading Heel suspension boot - when in bed Turn and reposition every 2 hours Medications-Please add to medication list. ntibiotics - RX renewed 05/25/2023 P.O. 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 Remove old dressing, discard into plastic bag and place into trash. Cleanse the wound with Wound Cleanser prior to applying a clean  dressing using gauze sponges, not tissues or cotton balls. Do not scrub or use excessive force. Pat dry using gauze sponges, not tissue or cotton balls. Secondary Dressing: Xtrasorb Large 6x9 (in/in) 1 x Per Day/30 Days Discharge Instructions: Apply to wound as directed. Do not cut. Secondary Dressing: Heel Cup 1 x Per Day/30 Days Secured With: Kerlix Roll Sterile or Non-Sterile 6-ply 4.5x4 (yd/yd) 1 x Per Day/30 Days Discharge Instructions: Apply Kerlix as directed Secured With: Tubigrip Size D, 3x10 (in/yd) 1 x Per Day/30 Days Wound #18 - T Second oe Wound Laterality:  Right, Medial Cleanser: Soap and Water 1 x Per Day/30 Days Discharge Instructions: Gently cleanse wound with antibacterial soap, rinse and pat dry prior to dressing wounds Cleanser: Wound Cleanser 1 x Per Day/30 Days BURDELL, POMPER ([id number]) [id number] Page 8 of 17 Discharge Instructions: Wash your hands with soap and water. Remove old dressing, discard into plastic bag and place into trash. Cleanse the wound with Wound Cleanser prior to applying a clean dressing using gauze sponges, not tissues or cotton balls. Do not scrub or use excessive force. Pat dry using gauze sponges, not tissue or cotton balls. Prim Dressing: Hydrofera Blue Ready Transfer Foam, 2.5x2.5 (in/in) 1 x Per Day/30 Days ary Discharge Instructions: Apply Hydrofera Blue Ready to wound bed as directed Secondary Dressing: Xtrasorb Large 6x9 (in/in) 1 x Per Day/30 Days Discharge Instructions: Apply to wound as directed. Do not cut. Secured With: [employer] or Non-Sterile 6-ply 4.5x4 (yd/yd) 1 x Per Day/30 Days Discharge Instructions: Apply Kerlix as directed Secured With: Tubigrip Size D, 3x10 (in/yd) 1 x Per Day/30 Days Wound #21 - Foot Wound Laterality: Dorsal, Right Cleanser: Soap and Water 1 x Per Day/30 Days Discharge Instructions: Gently cleanse wound with antibacterial soap, rinse and pat dry prior to dressing  wounds Cleanser: Wound Cleanser 1 x Per Day/30 Days Discharge Instructions: Wash your hands with soap and water. Remove old dressing, discard into plastic bag and place into trash. Cleanse the wound with Wound Cleanser prior to applying a clean dressing using gauze sponges, not tissues or cotton balls. Do not scrub or use excessive force. Pat dry using gauze sponges, not tissue or cotton balls. Secondary Dressing: Xtrasorb Large 6x9 (in/in) 1 x Per Day/30 Days Discharge Instructions: Apply to wound as directed. Do not cut. Secured With: [employer] or Non-Sterile 6-ply 4.5x4 (yd/yd) 1 x Per Day/30 Days Discharge Instructions: Apply Kerlix as directed Secured With: Tubigrip Size D, 3x10 (in/yd) 1 x Per Day/30 Days Wound #22 - Lower Leg Wound Laterality: Right, Anterior Cleanser: Soap and Water 1 x Per Day/30 Days Discharge Instructions: Gently cleanse wound with antibacterial soap, rinse and pat dry prior to dressing wounds Cleanser: Wound Cleanser 1 x Per Day/30 Days Discharge Instructions: Wash your hands with soap and water. Remove old dressing, discard into plastic bag and place into trash. Cleanse the wound with Wound Cleanser prior to applying a clean dressing using gauze sponges, not tissues or cotton balls. Do not scrub or use excessive force. Pat dry using gauze sponges, not tissue or cotton balls. Secondary Dressing: Xtrasorb Large 6x9 (in/in) 1 x Per Day/30 Days Discharge Instructions: Apply to wound as directed. Do not cut. Secured With: [employer] or Non-Sterile 6-ply 4.5x4 (yd/yd) 1 x Per Day/30 Days Discharge Instructions: Apply Kerlix as directed Secured With: Tubigrip Size D, 3x10 (in/yd) 1 x Per Day/30 Days Wound #23 - Lower Leg Wound Laterality: Right, Posterior Cleanser: Soap and Water 1 x Per Day/30 Days Discharge Instructions: Gently cleanse wound with antibacterial soap, rinse and pat dry prior to dressing wounds Cleanser: Wound Cleanser 1 x Per Day/30  Days Discharge Instructions: Wash your hands with soap and water. Remove old dressing, discard into plastic bag and place into trash. Cleanse the wound with Wound Cleanser prior to applying a clean dressing using gauze sponges, not tissues or cotton balls. Do not scrub or use excessive force. Pat dry using gauze sponges, not tissue or cotton balls. Secondary Dressing: Xtrasorb Large 6x9 (in/in) 1 x Per Day/30 Days Discharge Instructions: Apply  to wound as directed. Do not cut. Secured With: [employer] or Non-Sterile 6-ply 4.5x4 (yd/yd) 1 x Per Day/30 Days Discharge Instructions: Apply Kerlix as directed Secured With: Tubigrip Size D, 3x10 (in/yd) 1 x Per Day/30 Days Electronic Signature(s) Signed: 07/07/2023 4:50:41 PM By: Midge Aver MSN RN CNS WTA Signed: 07/08/2023 7:54:12 PM By: [name] PA-C Entered By: Midge Aver on 07/07/2023 11:16:46 Chad [name] ([id number]) [id number] Page 9 of 17 -------------------------------------------------------------------------------- Problem List Details Patient Name: Date of Service: Chad [name], Chad [name] 07/07/2023 10:15 A M Medical Record Number: [id number] Patient Account Number: [id number] Date of Birth/Sex: Treating RN: 07-26-1961 (62 y.o. 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 02/19/2023 No  Yes foot 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, stage 3 10/07/2022 No Yes Inactive Problems ICD-10 Code Description Active Date Inactive Date L97.322 Non-pressure chronic ulcer of left ankle with fat layer exposed 12/06/2021 12/06/2021 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 12/06/2021 12/06/2021 side Resolved Problems Electronic Signature(s) Signed: 07/07/2023 4:48:51 PM By: Midge Aver MSN RN CNS WTA Signed: 07/08/2023 7:54:12 PM By: [name] PA-C Previous Signature: 07/07/2023 1:46:15 PM Version By: [name] PA-C Entered By: Midge Aver on 07/07/2023 16:48:50 Chad [name] ([id number]) [id number] Page 10 of 17 -------------------------------------------------------------------------------- Progress Note Details Patient Name: Date of Service: JAYLYNN, HOLME 07/07/2023 10:15 A M Medical Record Number: [id number] Patient Account Number: [id number] Date of Birth/Sex: Treating RN: February 21, 1961 (62 y.o. [name] Primary Care Provider: [name] Other Clinician: Referring Provider: Treating Provider/Extender: [name] Weeks in Treatment: 57 Subjective Chief Complaint Information obtained from Patient Left ankle ulcer, Left dorsal foot wound, right heel History of Present Illness (HPI) 10/08/18 on evaluation today patient actually presents to our office for initial evaluation concerning wounds that he has of the bilateral lower extremities. 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 He also has bilateral venous stasis. He apparently has been homeless currently following discharge from the hospital apparently he has been placed at [facility] which is is a skilled nursing facility locally. Nonetheless fortunately he  does not show any signs of infection at this time which is good news. In fact several of the wound actually appears to be showing some signs of improvement already in my pinion. There are a couple areas in the left leg in particular there likely gonna require some sharp debridement to help clear away some necrotic tissue and help with more sufficient healing. No fevers, chills, nausea, or vomiting noted at this time. 10/15/18 on evaluation today patient actually appears to be doing very well in regard to his bilateral lower extremities. He's been tolerating the dressing changes without complication. Fortunately there does not appear to be any evidence of active infection at this time which is great news. Overall I'm actually very pleased with how this has progressed  in just one visits time. Readmission: 08/14/2020 upon evaluation today patient presents for re-evaluation here in our clinic. He is having issues with his left ankle region as well as his right toe and his right heel. He tells me that the toe and heel actually began as a area that was itching that he was scratching and then subsequently opened up into wounds. These may have been abscess areas I presume based on what I am seeing currently. With regard to his left ankle region he tells me this was a similar type occurrence although he does have venous stasis this very well may be more of a venous leg ulcer more than anything. Nonetheless I do believe that the patient would benefit from appropriate and aggressive wound care to try to help get things under better control here. He does have history of a stroke on the left side affecting him to some degree there that he is able to stand although he does have some residual weakness. Otherwise again the patient does have chronic venous insufficiency as previously noted. His arterial studies most recently obtained showed that he had an ABI on the right of 1.16 with a TBI of 0.52 and on the left and  ABI of 1.14 with a TBI of 0.81. That was obtained on 06/19/2020. 08/28/2020 upon evaluation today patient appears to be doing decently well in regard to his wounds in general. He has been tolerating the dressing changes without complication. Fortunately there does not appear to be any signs of active infection which is great news. With that being said I think the [facility] is doing a good job I would recommend that we likely continue with that currently. 09/11/2020 upon evaluation today patient's wounds did not appear to be doing too poorly but again he is not really showing signs of significant improvement with regard to any of the wounds on the right. None of them have Hydrofera Blue on them I am not exactly sure why this is not being followed as the facility did not contact us to let us know of any issues with obtaining dressings or otherwise. With that being said he is supposed to be using Hydrofera Blue on both of the wounds on the right foot as well as the ankle wound on the left side. 09/18/2020 upon evaluation today patient appears to be doing poorly with regard to his wounds. Again right now the left ankle in particular showed signs of extreme maceration. Apparently he was told by someone with staff at [facility] they could not get the [facility]. With that being said this is something that is never been relayed to [place] one way or another. Also the patient subsequently has not supposed to have a border gauze dressing on. He should have an ABD pad and roll gauze to secure as this drains much too much just to have a border gauze dressing to cover. Nonetheless the fact that they are not using the appropriate dressing is directly causing deterioration of the left ankle wound it is significantly worse today compared to what it was previous. I did attempt to call [facility] while the patient was here I called three times and got no one to even pick up the phone. After this I had  my for an office coordinator call and she was able to finally get through and leave a message with the D ON as of dictation of this note which is roughly about an hour and a half later I still  have not been able to speak with anyone at the facility. 09/25/2020 upon evaluation today patient actually showing signs of good improvement which is excellent news. He has been tolerating the dressing changes without complication. Fortunately there is no signs of active infection which is great news. No fevers, chills, nausea, vomiting, or diarrhea. I do feel like the facility has been doing a much better job at taking care of him as far as the dressings are concerned. However the director of nursing never did call me back. 10/09/2020 upon evaluation today patient appears to be doing well with regard to his wound. The toe ulcer did require some debridement but the other 2 areas actually appear to be doing quite well. 10/19/2020 upon evaluation today patient actually appears to be doing very well in regard to his wounds. In fact the heel does appear to be completely healed. The toe is doing better in the medial ankle on the left is also doing better. Overall I think he is headed in the right direction. 10/26/2020 upon evaluation today patient appears to be doing well with regard to his wound. He is showing signs of improvement which is great news and overall I am very pleased with where things stand today. No fevers, chills, nausea, vomiting, or diarrhea. 11/02/2020 upon evaluation today patient appears to be doing well with regard to his wounds. He has been tolerating the dressing changes without complication overall I am extremely pleased with where things stand today. He in regard to the toe is almost completely healed and the medial ankle on the left is doing much better. Chad [name], Chad [name] ([id number]) [id number] Page 11 of 17 11/09/2020 upon evaluation today patient appears to be doing a  little poorly in regard to his left medial ankle ulcer. Fortunately there does not appear to be any signs of systemic infection but unfortunately locally he does appear to be infected in fact he has blue-green drainage consistent with Pseudomonas. 11/16/2020 upon evaluation today patient appears to be doing well with regard to his wound. It actually appears to be doing better. I did place him on gentamicin cream since the Cipro was actually resistant even though he was positive for Pseudomonas on culture. Overall I think that he does seem to be doing better though I am unsure whether or not they have actually been putting the cream on. The patient is not sure that we did talk to the nurse directly and she was going to initiate that treatment. Fortunately there does not appear to be any signs of active infection at this time. No fevers, chills, nausea, vomiting, or diarrhea. 4/28; the area on the right second toe is close to healed. Left medial ankle required debridement 12/07/2020 upon evaluation today patient appears to be doing well with regard to his wounds. In fact the right second toe appears to be completely healed which is great news. Fortunately there does not appear to be any signs of active infection at this time which is also great news. I think we can probably discontinue the gentamicin on top of everything else. 12/14/2020 upon evaluation today patient appears to be doing well with regard to his wound. He is making good progress and overall very pleased with where things stand today. There is no signs of active infection at this time which is great news. 12/28/2020 upon evaluation today patient appears to be doing well with regard to his wounds. He has been tolerating the dressing changes without complication. Fortunately there is no signs of active  infection at this time. No fevers, chills, nausea, vomiting, or diarrhea. 12/28/2020 upon evaluation today patient's wound bed actually showed signs  of excellent improvement. He has great epithelization and granulation I do not see any signs of infection overall I am extremely pleased with where things stand at this point. No fevers, chills, nausea, vomiting, or diarrhea. 01/11/2021 upon evaluation today patient appears to be doing well with regard to his wound on his leg. He has been tolerating the dressing changes without complication. Fortunately there does not appear to be any signs of active infection which is great news. No fevers, chills, nausea, vomiting, or diarrhea. 01/25/2021 upon evaluation today patient appears to be doing well with regard to his wound. He has been tolerating the dressing changes without complication. Fortunately the collagen seems to be doing a great job which is excellent news. No fevers, chills, nausea, vomiting, or diarrhea. 02/08/2021 upon evaluation today patient's wound is actually looking a little bit worse especially in the periwound compared to previous. Fortunately there does not appear to be any signs of infection which is great news with that being said he does have some irritation around the periphery of the wound which has me more concerned. He actually had a dressing on that had not been changed in 3 days. He also is supposed to have daily dressing changes. With regard to the dressing applied he had a silver alginate dressing and silver collagen is what is recommended and ordered. He also had no Desitin around the edges of the wound in the periwound region although that is on the order inspect to be done as well. In general I was very concerned I did contact [facility] actually spoke with Verlon Au who is the scheduling individual and subsequently she stated that she would pass the information to the D ON apparently the D ON was not available to talk to me when I call today. 02/18/2021 upon evaluation today patient's wound is actually showing signs of improvement. Fortunately there does not appear to be  any evidence of infection which is great news overall I am extremely pleased with where things stand today. No fevers, chills, nausea, vomiting, or diarrhea. 8/3; patient presents for 1 week follow-up. He has no issues or complaints today. He denies signs of infection. 03/11/2021 upon evaluation today patient appears to be doing well with regard to his wound. He does have a little bit of slough noted on the surface of the wound but fortunately there does not appear to be any signs of active infection at this time. No fevers, chills, nausea, vomiting, or diarrhea. 03/18/2021 upon evaluation today patient appears to be doing well with regard to his wound. He has been tolerating the dressing changes without complication. There was a little irritation more proximal to where the wound was that was not noted last week but nonetheless this is very superficial just seems to be more irritation we just need to make sure to put a good amount of the zinc over the area in my opinion. Otherwise he does not seem to be doing significantly worse at all which is great news. 03/25/2021 upon evaluation today patient appears to be doing well with regard to his wound. He is going require some sharp debridement today to clear with some of the necrotic debris. I did perform this today without complication postdebridement wound bed appears to be doing much better this is great news. 04/08/2021 upon evaluation today patient appears to be doing decently well in regard to  his wound although the overall measurement is not significantly smaller compared to previous. It is gone down a little bit but still the facility continues to not really put the appropriate dressings in place in fact he was supposed to have collagen we think he probably had more of an allergy to At this point. Fortunately there does not appear to be any signs of active infection systemically though locally I do not see anything on initial visualization either as  far as erythema or warmth. 04/15/2021 upon evaluation today patient appears to be doing well with regard to his wound. He is actually showing signs of improvement. I did place him on antibiotics last week, Cipro. He has been taking that 2 times a day and seems to be tolerating it very well. I do not see any evidence of worsening and in fact the overall appearance of the wound is smaller today which is also great news. 9/26; left medial ankle chronic venous insufficiency wound is improved. Using Hydrofera Blue 10/10; left medial ankle chronic venous insufficiency. Wound has not changed much in appearance completely nonviable surface. Apparently there have been problems getting the right product on the wound at the facility although he came in with [facility] on today 05/14/2021 upon evaluation today patient appears to be doing well with regard to his wound. I think he is making progress here which is good news. Fortunately there does not appear to be any signs of active infection at this time. No fevers, chills, nausea, vomiting, or diarrhea. 05/20/2021 upon evaluation today patient appears to be doing well with regard to his wound. He is showing signs of good improvement which is great news. There does not appear to be any evidence of active infection which is also excellent news. No fevers, chills, nausea, vomiting, or diarrhea. 05/28/2021 upon evaluation today patient appears to be doing quite well. There does not appear to be any signs of active infection at this time which is great news. Overall I am extremely pleased with where things stand today. I think he is headed in the right direction. 06/11/2021 upon evaluation today patient appears to be doing well with regard to his left ankle ulcer and poorly in regard to the toe ulcer on the second toe right foot. This appears to show signs of joint exposure. Apparently this has been present for 1 to 2 months although he kept forgetting to tell me  about it. That is unfortunate as right now it definitely appears to be doing significantly worse than what I would like to see. There does not appear to be any signs of active infection systemically though locally I am concerned about the possibility of infection the toe is quite red. Again no one from the facility ever contacted [place] to advise that this was going on in the interim either. 06/17/2021 upon evaluation today patient presents for follow-up I did review his x-ray which showed a navicular bone fracture I am unsure of the chronicity of this. Subsequently he also had osteomyelitis of the toe which was what I was more concerned about this did not show up on x-ray but did show up on the pathology scrapings. This was listed as acute osteomyelitis. Nonetheless at this point I think that the antibiotic treatment is the best regimen to go with currently. The patient is in agreement with that plan. Nonetheless he has initially 30 days of doxycycline off likely extend that towards the end of the treatment cycle that will be around the middle  of December for an additional 2 weeks. That all depends on how well he continues to heal. Nonetheless based on what I am seeing in the foot I did want a proceed with an MRI as well which I think will be helpful to identify if there is anything else that needs to be addressed from the standpoint of infection. 06/24/2021 upon evaluation today patient appears to be doing pretty well in regards to his wounds. I think both are actually showing signs of improvement which is good I did review his MRI today which did show signs of osteomyelitis of the middle and proximal phalanx on his right foot of the affected toe. With that being said this is actually showing signs of significant improvement today already with the antibiotic therapy I think the redness is also improved. Overall I EYAS, MORON ([id number]) [id number] Page 12 of 17 think  that we just need to give this some time with appropriate wound care we will see how things go potentially hyperbarics could be considered. 07/02/2021 upon inspection today patient actually appears to be doing well in regard to his left ankle which is getting very close to complete resolution of pleased in that regard. Unfortunately he is continuing to have issues with his second toe right foot and this seems to still be very painful for him. Recommend he try something different from the standpoint of antibiotics. 07/15/2021 upon evaluation today patient appears to be doing actually pretty well in regard to his foot. This is actually showing signs of significant improvement which is great news. Overall I feel like the patient is improving both in regard to the second toe as well as the ankle on the left. With that being said the biggest issue that I do see currently is that he is needing to have a refill of the doxycycline that we previously treated him with. He also did see podiatry they are not going to recommend any amputation at this point since he seems to be doing quite well. For that reason we just need to keep things under control from an infection standpoint. 08/01/2021 upon evaluation today patient appears to be doing well with regard to his wound. He has been tolerating the dressing changes without complication. Fortunately there does not appear to be any evidence of active infection locally nor systemically at this point. In fact I think everything is doing excellent in fact his second toe on the right foot is almost healed and the ankle on the left ankle region is actually very close to being healed as well. 08/08/2021 upon evaluation today patient appears to be doing well with regard to his wound. He has been tolerating the dressing changes without complication. Fortunately I do not see any signs of active infection at this time. Readmission: 12-06-2021 upon evaluation today patient presents  for reevaluation here in the clinic he does tell me that he was being seen in facility at [facility] by a provider that was coming in. He is not sure who this was. He tells me however that the wound seems to have gotten worse even compared to where it was when we last saw him at this point. With that being said I do believe that he is likely going need ongoing wound care here in the clinic and I do believe that we need to be the ones to frontline this since his wound does seem to be getting worse not better at this point. He voiced understanding. He is also in agreement with  this plan and feels more comfortable coming here she tells me. Patient's medical history really has not changed since his prior admission he was only gone since January. 12-27-2021 upon evaluation today patient appears to be doing well with regard to his wound they did run out of the [facility] so they did not put anything on just an ABD pad with gentamicin. Still we are seeing some signs of good improvement here with some new epithelization which is great news. 01-10-2022 upon evaluation today patient appears to be doing well with regard to his wounds and he is going require some sharp debridement but overall seems to be making good progress. Fortunately I do not see any evidence of active infection locally or systemically at this time which is great news. 01-24-2022 upon evaluation today patient appears to be doing well with regard to his wound. The facility actually came and dropped him off early and he had another appointment at the hospital and then they just brought him over here and this was still hours before his appointment this afternoon. For that reason we did do our best to work him in this morning and fortunately had some space to make this happen. With that being said patient's wound does seem to be making progress here and I am very pleased in that regard I do not see any signs of active infection locally or  systemically at this time. 02-07-2022 patient appears to be doing well currently in regard to his wounds. In fact one of them the more proximal is healed the distal is still open but seems to be doing excellent. Fortunately I do not see any evidence of active infection locally or systemically at this time which is great news. No fevers, chills, nausea, vomiting, or diarrhea. 7/27; left medial ankle venous. Improving per our intake nurse. We are using Hydrofera Blue under Tubigrip compression. They are changing that at his facility 03-06-2022 upon evaluation today patient appears to be doing well with regard to the his wound he is can require some sharp debridement but seems to be making excellent progress. Fortunately I do not see any evidence of active infection locally or systemically which is great news. 03-27-2022 upon evaluation today patient's wound is actually showing signs of excellent improvement. Fortunately I see no signs of active infection locally or systemically at this time which is great news. No fevers, chills, nausea, vomiting, or diarrhea. 04-21-2022 upon evaluation today patient appears to be doing excellent in regard to his wound in fact this is very close to resolution based on what I am seeing. I do not see any evidence of active infection locally or systemically at this time which is great news and overall I am extremely pleased with where we are today. 05-05-2022 upon evaluation today patient's wound actually appears to potentially be completely healed. Fortunately I do not see any evidence of active infection at this time which is great news and overall I am very pleased I think this needs a little time to toughen up but other than that I really do believe were doing quite well. He is very pleased to hear this its been a long time coming. 05-19-2022 upon evaluation today patient appears to be doing well currently in regard to his wound. He in fact we were hoping will be completely  healed and closed but it still has a very tiny area right in the center which is still continuing to drain. Fortunately I do not see any signs of infection  locally or systemically which is great news. 11/6; this wound is close to completely" he comes in this week with some erythema at 1 edge of this which looks like something was rubbing on the area. Other than that no evidence of infection 06-16-2022 upon evaluation today patient appears to be doing well currently in regard to his ankle ulcer. This is very close to being healed but still has a small area in the central portion which is not. Fortunately there does not appear to be any signs of infection locally or systemically which is great news. No fevers, chills, nausea, vomiting, or diarrhea. 06-30-2022 upon evaluation patient's wound pretty much appears to be almost completely closed. In fact it may even be closed but there are still a lot of skin irritation around the edges of the wound and I just do not feel great about releasing them yet I would like to continue to monitor this just a little bit longer before getting to that point. He is not opposed to this and in fact is happy to continue to come in if need be in order to make sure that things are moving in the right direction he definitely does not want to backtrack. 07-14-2022 upon evaluation today patient appears to be doing well currently in regard to his wound. Has been tolerating the dressing changes without complication. Fortunately I see no evidence of active infection locally nor systemically which is great news. No fevers, chills, nausea, vomiting, or diarrhea. 08-11-2022 upon evaluation today patient appears to be doing a little worse than last time I saw him although it has been a month. Unfortunately he was not able to be seen due to the fact that he was quarantined in the facility with COVID. Subsequently he tells me that he mention to the facility staff several times about  taking it off over the past several weeks but they continue to tell him that someone have to get in touch with [place] here although nobody ever did until Friday when the nurse manager called to have that documented in the communication notes. With that being said unfortunately this means that he had a wrap on that he was supposed to have on for only 1 week for ended up being on four 1 month essentially. I am glad that there is nothing worse than what we see currently to be perfectly honest. 08-19-2022 upon evaluation today patient appears to be doing well currently in regard to his wounds. He has been tolerating the dressing changes without complication. This is actually smaller today which is great news after last week's reopening. Fortunately I do not see any evidence of infection locally nor systemically at this point. 08-26-2022 upon evaluation today patient appears to be doing well currently in regard to his wound. He has been tolerating the dressing changes without complication. Fortunately there does not appear to be any signs of active infection locally nor systemically which is great news. No fevers, chills, nausea, Chad [name], Chad [name] ([id number]) [id number] Page 13 of 17 vomiting, or diarrhea. 09-02-2022 upon evaluation today patient's wound actually showing signs of excellent improvement this is measuring smaller and looking much better. I am very pleased with where we stand I do believe that we are headed in the right direction. 09-09-2022 upon evaluation today patient appears to be doing decently well in regard to his leg in general although there was some swelling this is the 1 thing that I am not too happy with based on what I see currently. Fortunately  there does not appear to be any signs of infection locally nor systemically at this point. 09-16-2022 upon evaluation today patient appears to be doing well currently in regard to his wound. He is actually showing signs  of improvement he wore the Tubigrip this week and it significantly better compared to last week's evaluation. Fortunately I do not see any evidence of active infection locally or systemically which is great news. Overall I think that were headed in the right direction which is great news. In fact overall I think this is the best that he has looked in some time. He does not like the Tubigrip but actually did extremely well with this in my opinion. I think he needs to continue to utilize this. 09-23-2022 upon evaluation today patient appears to be doing well currently in regard to his wound. He has been tolerating the dressing changes without complication. Fortunately there does not appear to be any signs of active infection locally nor systemically which is great news and in general I do feel like that we are headed in the right direction. He has been having trouble with the Tubigrip however we have gotten the facility to order compression socks he has not gotten them as of yet. 3/4; patient is about the same with a wound on the left posterior leg. This is a venous wound. He needs more compression on this leg but he took off the previous wraps we are using. I am not certain whether he is using juxta lite stockings at the nursing home. 3/12; patient presents for follow-up. He unfortunately has 2 new wounds 1 to the dorsal left side and the other to the right heel. He is not sure how these happened. The right heel appears to be caused by pressure. He has been using Hydrofera Blue to the original wound on the left leg. 10-14-2022 upon evaluation today patient appears to be doing well currently in regard to his original wound but unfortunately he has several new wounds which I was completely unaware of before walking into the room to see him today. Unfortunately he seems to not be doing nearly as well as what I saw when I last had an appointment with him 3 weeks ago. Fortunately I do not see any signs of  systemic infection though unfortunately locally he has definite infection of the left lower extremity. He is currently on doxycycline I am going to likely have to extend that today. 10-21-2022 upon evaluation today patient appears to be doing well currently in regard to his wounds. He seems to be tolerating the dressing changes without complication. Fortunately there does not appear to be any signs of active infection locally or systemically which is great news and in general I definitely feel like we are on the right track here. 10-30-2022 upon evaluation today patient still continues to not be doing too well at all with regard to his wounds. Unfortunately he is still having signs of infection as well which also has been concerned. Fortunately I do not see any signs of active infection locally nor systemically at this time. No fevers, chills, nausea, vomiting, or diarrhea. 11-06-2022 upon evaluation today patient appears to be doing okay currently in regard to his wounds. All things considered with his blood flow not being nearly as good as what should I think that he is making pretty good progress here. Fortunately I do not see any signs of infection locally nor systemically which is great news. No fevers, chills, nausea, vomiting, or diarrhea.  11-13-2022 upon evaluation today patient appears to be doing poorly currently in regard to his wounds. Things seem to be getting a little bit worse not better. 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 Subsequently he is also going to need to have vascular intervention but we are unsure exactly when this is going on the undertaking. I would try to actually send a message to Dr. Hazle Quant and the vascular team just to see what exactly the plan is going forward. The patient is in agreement with that plan I just think we need to have a plan especially in light of the fact  the left leg seems to be getting a little bit worse at this point. 11-25-2022 upon evaluation today patient is currently now discharged from the hospital following his colostomy procedure. That did very well and he seems to be doing well. Fortunately there does not appear to be any signs of active infection locally or systemically which is great news. No fevers, chills, nausea, vomiting, or diarrhea. 12-04-2022 upon evaluation today patient appears to be doing actually decently well in regard to his wounds and actually very pleased with where we stand and I think that he is making some pretty good progress here. Fortunately I do not see any signs of active infection locally nor systemically which is great news. No fevers, chills, nausea, vomiting, or diarrhea. 12-12-2022 upon evaluation today patient appears to be doing well currently in regard to his wound. He has been tolerating the dressing changes without complication. The wounds all seem to be making some progress to a degree. He has appointment with vascular on the 23rd of this month. 5/24; the patient was scheduled for a left lower extremity angiogram yesterday but apparently they were overbooked and they wanted to admit him to hospital last night so they could do this early this morning but he refused I see that Dr. Wyn Quaker has rescheduled him for June 6. He is using silver alginate to wounds on his bilateral lower extremities. 01-02-2023 upon evaluation today patient appears to be doing poorly in regard to his wounds still. Subsequently he did have his angiogram but apparently there really was not much that was found that could be revascularized. The patient tells me that Dr. Wyn Quaker was going to contact me here in the office I have not heard from him as of yet and I may see about giving him a call just to follow-up or possibly send a message through epic which could be an option as well. 01-09-2023 upon evaluation today patient appears to be doing poorly  in regard to his legs he is extremely swollen. He has been sleeping in his chair he tells me for the past week. His bed is broken and they have ordered a new one which is supposedly supposed to be there today. With that being said he the way he needs to be not sleeping in his chair that is his wheelchair every day and through the night. This is causing his legs to be much more swollen and it seen and how badly wounds are doing at this point. This has made things significantly worse. Fortunately I do not see any signs of infection right now unfortunately I do believe that the patient has a tremendous amount of drainage currently. 01-19-2023 upon evaluation today patient appears to be doing poorly in regard to his wounds of his legs his  swelling is better but still not as good as what we would like to see. Fortunately I do not see any evidence of active infection locally nor systemically which is great news and in general I do believe that we are moving in the right direction here. Nonetheless it would be better if she was in the bed sleeping or not send up in his wheelchair although he seems to still be set up in the wheelchair at this time. He tells me that he is just not as comfortable in the bed as the wheelchair but unfortunately this is causing damage to his legs which I discussed with him today as well. Nonetheless I am not sure that he is going to be amendable to doing anything different. 01-27-2023 upon evaluation today patient appears to be doing well currently. Fortunately I do not see any evidence of active infection locally nor systemically which is great news I think he is actually doing the best option and while in fact I would try to see if we can do some debridement today to get things moving along. 02-03-2023 upon evaluation today patient's wounds unfortunately are not doing nearly as well. Will be doing the [employer] wraps unfortunately we just not seeing the improvement that I would like  to see. Fortunately I do not see any evidence of infection at this time which is good news unfortunately I do think that he needs to be changed on a daily basis due to the amount of drainage otherwise I do not think this is really going to continue to improve dramatically. 02-12-2023 upon evaluation today patient appears to be doing better in a lot of ways in regard to his wounds and although his toes on the left side actually seem a bit worse. Fortunately I do not see any evidence of active infection locally nor systemically at this time which is good news. With that being said he is having some bleeding from the left leg I think that this is an area that looks like it probably got bumped. With that being said I discussed this with the patient Chad [name], Chad [name] ([id number]) [id number] Page 14 of 17 he tells me he does not know of any trauma that occurred but at the same time if was not this that it was skin that got pulled off. Dressing change there is only one of the 2 that could have happened. 7/25; this is a patient who is very frail at [facility] facility. He is nonambulatory. He has wounds on his bilateral lower legs. However much worse on the left we have been using silver cell to all of his wounds He comes in today and a lot more pain our intake nurse reports deterioration of the left foot. I ended up looking through some of his record. 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 I am really not certain of the stage of this currently or who is following 02-26-2023 upon evaluation today patient unfortunately appears to be doing significantly worse compared to where he was previous. The left foot is  showing signs of dramatic worsening in regard to the overall appearance his toes are starting to become necrotic almost in full and he has much more cyanosis present than what I even seen previous. He is already seeing Dr. Wyn Quaker and unfortunately there was not anything that they saw that was an immediate cause for this but at the same time I feel like this may have changed and even worsened I feel like this could be a vascular event and I believe this is a limb-threatening situation. 03/26/2023; I last saw this patient on 8/1. Wondered about below-knee amputations versus palliative care. 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 The wound has healed fairly well and he follows up with surgery today. He is back today looking at wounds on the right foot and lower leg. This includes 2 areas on the right heel, right dorsal foot right dorsal first and second toes and a small area on the right anterior lower tibia area. He has a lot of pain in this leg as well. The angiogram he had on 01/01/2023 specific to the right leg now demonstrated a fairly normal common femoral artery, profunda femoris artery and superficial and popliteal arteries without significant disease. 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 No doubt this has a lot to do with the discomfort in the right leg he describes when he is in bed. They have been using Xeroform on the wounds on the right. He has a lot of swelling in this leg with weeping edema 04-09-2023 upon evaluation today patient appears to be doing poorly still in regard to his right leg. His left leg at the  amputation site is tells me he is doing quite well. Fortunately I do not see any signs of active infection locally or systemically at this time which is great news. No fevers, chills, nausea, vomiting, or diarrhea. With that being said I am concerned still about the fact that he seems to be getting worse especially in regard to the heels I think that he is still sitting up most of the day which I think is still causing some swelling of his leg unfortunately. 04-16-2023 upon evaluation today patient actually appears to be doing little bit more poorly compared to last week this actually does appear to be infected. Last week I really did not think that was the case but this seems like it may indeed be the case. Fortunately I do not see any signs of active infection locally or systemically at this time. 04-24-2023 upon evaluation today patient appears to be doing well currently in regard to his leg compared to where we were previous I do feel like that the antibiotics have been of benefit. Fortunately I do not see any signs of active infection at this time which is great news and in general I believe that we are making headway towards healing which is great news as well. With that being said he still has a lot of drainage I really feel like something like Anola Gurney would be better for him than just an ABD pad it would likely the drainage more or even Zetuvit's but I am not sure whether we can get either 1 of these at the facility for him. 05-01-2023 upon evaluation today patient appears to be doing well currently in regard to his wounds which are showing signs of significant improvement is not any  way shape or form infected like it was last time I saw him. I think the antibiotics are doing a good job but I think he may need an extension here to ensure that it completely clears. 05-08-2023 upon evaluation today patient appears to be doing well currently in regard to his wounds for the most part. He still has  blue-green drainage noted but in general seems to be doing much better compared to where we were previous. Fortunately I do not see any signs of active infection locally or systemically which is great news and in general I do believe that we are making good headway towards complete closure. 05-18-2023 upon evaluation today patient appears to be doing well currently in regard to his wounds I feel like he is definitely making some improvements here. This is a slow improvement but nonetheless improvement. I do believe that he is doing quite well as far as infection is concerned which is also good news. 05-25-2023 upon evaluation today patient actually appears to be doing worse in regard to his legs at this point. Unfortunately he continues to have issues with significant drainage which is the primary concern here. I do feel like that the wound currently is not doing a whole lot better when he was on the Levaquin I felt like it was improving some but the concern there is QT prolongation I do not want to continue that personally further I think he needs a referral ASAP to infectious disease. I discussed that with him today. He also needs to be having this changed every day and that has not been happening at this point. That is my biggest concern here. 06-09-2023 upon evaluation today patient appears to be doing still poorly in regard to this right leg which is draining profusely. This is unfortunately just continuing to show signs of being very moist and wet and weeping quite significantly. This is despite everything that we are doing including the facility have been placed him on Levaquin by ID which unfortunately just does not seem to be helping to heal this as well as we would hope either. I do have an infectious disease appointment that we sent 10 for him and that is actually scheduled for next week on the 19th. 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 06-17-2023 upon evaluation today patient appears to be doing still poorly in regard to his legs at this point. Unfortunately he still seems to be having issues with significant drainage we are having this changed every single day. With that being said despite this the patient just does not seem to be making a lot of headway here towards closure. We are trying to do what we can from the standpoint of compression as well. He did see infectious disease yesterday. I did review that note as well. Upon evaluation the note actually states that the patient told the physician "do I have to keep suffering with wounds forevero Is there a way to get rid of these woundso However he did not want a referral to see Dr. Lajoyce Corners for potential amputation which seems to be 1 option that was proposed. With that being said he also apparently told her that he was "adamant about not doing another course of antibiotics as he feels like they have not healed his wounds". It appears based on evaluation with regard to the record that the patient did have  a discussion with her about the possibility of doing a short course of IV cefepime for 14 days to cover Serratia as well as the other bacteria noted on culture. However the patient declined this as he felt like the antibiotics in the past he had taken did not help at all and there were no signs and symptoms to suggest sepsis. Therefore she recommended continuing wound care and to continue to follow-up with vascular follow-up in 2 weeks with her. I appreciate this evaluation and again I cannot do anything with the fact that the patient did not want to proceed with any further treatment with regard to the bacterial infection. 06-23-2023 the patient seems to making some progress here in regard to his wound. I feel like it is still a tremendous amount of drainage but at the same time I feel like you are also showing some signs of  significant improvement which is great news and very pleased with where things stand currently. Especially compared to where we have been. 07-07-2023 upon evaluation today patient appears to be doing about the same currently in regard to his wounds. He does not seem quite as erythematous as he has been around the wounds but the wounds continue to get larger due to the swelling and the drainage. Again I think the moisture is causing additional and further breakdown which is unfortunate. With that being said I discussed with the patient that I do believe that the only way to get this better is going to be to keep the swelling under control and the drainage off of his skin. This is the hardest part to be sure. I think the alginate dressing is being used at the facility is DENICO, SHADBURN ([id number]) [id number] Page 15 of 17 actually keeping things more moist which is not ideal. Objective Constitutional Well-nourished and well-hydrated in no acute distress. Vitals Time Taken: 10:13 AM, Height: 69 in, Weight: 150 lbs, BMI: 22.1, Temperature: 98.2 F, Pulse: 92 bpm, Respiratory Rate: 18 breaths/min, Blood Pressure: 126/78 mmHg. Respiratory normal breathing without difficulty. Psychiatric this patient is able to make decisions and demonstrates good insight into disease process. Alert and Oriented x 3. pleasant and cooperative. General Notes: Upon inspection patient's wound bed actually showed signs of pretty good granulation epithelization at this point. However there was some slough and biofilm he is having a lot of discomfort and again the areas are so large that debridement is really not possible due to the significant nature of the wounds. Integumentary (Hair, Skin) Wound #16 status is Open. Original cause of wound was Gradually Appeared. The date acquired was: 09/26/2022. The wound has been in treatment 39 weeks. The wound is located on the Right Calcaneus. The wound  measures 10cm length x 10cm width x 0.3cm depth; 78.54cm^2 area and 23.562cm^3 volume. There is a medium amount of serosanguineous drainage noted. Wound #18 status is Open. Original cause of wound was Pressure Injury. The date acquired was: 09/26/2022. The wound has been in treatment 38 weeks. The wound is located on the Right,Medial T Second. The wound measures 2.8cm length x 1cm width x 0.1cm depth; 2.199cm^2 area and 0.22cm^3 volume. There oe is a medium amount of serosanguineous drainage noted. Wound #21 status is Open. Original cause of wound was Pressure Injury. The date acquired was: 03/02/2023. The wound has been in treatment 14 weeks. The wound is located on the Right,Dorsal Foot. The wound measures 5cm length x 4.7cm width x 0.2cm depth; 18.457cm^2 area and 3.691cm^3 volume. There is a  medium amount of serous drainage noted. Wound #22 status is Open. Original cause of wound was Pressure Injury. The date acquired was: 03/02/2023. The wound has been in treatment 14 weeks. The wound is located on the Right,Anterior Lower Leg. The wound measures 15.5cm length x 12cm width x 0.2cm depth; 146.084cm^2 area and 29.217cm^3 volume. There is a medium amount of serous drainage noted. Wound #23 status is Open. Original cause of wound was Pressure Injury. The date acquired was: 05/02/2023. The wound has been in treatment 8 weeks. The wound is located on the Right,Posterior Lower Leg. The wound measures 8cm length x 9cm width x 0.2cm depth; 56.549cm^2 area and 11.31cm^3 volume. There is a medium amount of serosanguineous drainage noted. 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 Nurse Visit as needed Bathing/ Shower/ Hygiene: Wash wounds with antibacterial soap and water. - Leave off the calcium alginate, Xtra sorb only except, place hydrofera blue strips between the toes No tub bath. Anesthetic (Use 'Patient Medications' Section for Anesthetic Order Entry): Lidocaine applied to wound bed Off-Loading: Heel suspension boot - when in bed Turn and reposition every 2 hours Medications-Please add to medication list.: P.O. Antibiotics - RX renewed 05/25/2023 WOUND #16: - Calcaneus Wound Laterality: Right Cleanser: Soap and Water 1 x Per Day/30 Days Discharge Instructions: Gently cleanse wound with antibacterial soap, rinse and pat dry prior to dressing wounds Chad [name] ([id number]) [id number] Page 16 of 17 Cleanser: Wound Cleanser 1 x Per Day/30 Days Discharge Instructions: Wash your hands with soap and water. Remove old dressing, discard into plastic bag and place into trash. Cleanse the wound with Wound Cleanser prior to applying a clean dressing using gauze sponges, not tissues or cotton balls. Do not scrub or use excessive force. Pat dry using gauze sponges, not tissue or cotton balls. Secondary Dressing: Xtrasorb Large 6x9 (in/in) 1 x Per Day/30 Days Discharge Instructions: Apply to wound as directed. Do not cut. Secondary Dressing: Heel Cup 1 x Per Day/30 Days Secured With: Kerlix Roll Sterile or Non-Sterile 6-ply 4.5x4 (yd/yd) 1 x Per Day/30 Days Discharge Instructions: Apply Kerlix as directed Secured With: Tubigrip Size D, 3x10 (in/yd) 1 x Per Day/30 Days WOUND #18: - T Second Wound Laterality: Right, Medial oe Cleanser: Soap and Water 1 x Per Day/30 Days Discharge Instructions: Gently cleanse wound with antibacterial soap, rinse and pat dry prior to dressing wounds Cleanser: Wound Cleanser 1 x Per Day/30  Days Discharge Instructions: Wash your hands with soap and water. Remove old dressing, discard into plastic bag and place into trash. Cleanse the wound with Wound Cleanser prior to applying a clean dressing using gauze sponges, not tissues or cotton balls. Do not scrub or use excessive force. Pat dry using gauze sponges, not tissue or cotton balls. Prim Dressing: Hydrofera Blue Ready Transfer Foam, 2.5x2.5 (in/in) 1 x Per Day/30 Days ary Discharge Instructions: Apply Hydrofera Blue Ready to wound bed as directed Secondary Dressing: Xtrasorb Large 6x9 (in/in) 1 x Per Day/30 Days Discharge Instructions: Apply to wound as directed. Do not cut. Secured With: [employer] or Non-Sterile 6-ply 4.5x4 (yd/yd) 1 x Per Day/30 Days Discharge  Instructions: Apply Kerlix as directed Secured With: Tubigrip Size D, 3x10 (in/yd) 1 x Per Day/30 Days WOUND #21: - Foot Wound Laterality: Dorsal, Right Cleanser: Soap and Water 1 x Per Day/30 Days Discharge Instructions: Gently cleanse wound with antibacterial soap, rinse and pat dry prior to dressing wounds Cleanser: Wound Cleanser 1 x Per Day/30 Days Discharge Instructions: Wash your hands with soap and water. Remove old dressing, discard into plastic bag and place into trash. Cleanse the wound with Wound Cleanser prior to applying a clean dressing using gauze sponges, not tissues or cotton balls. Do not scrub or use excessive force. Pat dry using gauze sponges, not tissue or cotton balls. Secondary Dressing: Xtrasorb Large 6x9 (in/in) 1 x Per Day/30 Days Discharge Instructions: Apply to wound as directed. Do not cut. Secured With: [employer] or Non-Sterile 6-ply 4.5x4 (yd/yd) 1 x Per Day/30 Days Discharge Instructions: Apply Kerlix as directed Secured With: Tubigrip Size D, 3x10 (in/yd) 1 x Per Day/30 Days WOUND #22: - Lower Leg Wound Laterality: Right, Anterior Cleanser: Soap and Water 1 x Per Day/30 Days Discharge Instructions: Gently cleanse  wound with antibacterial soap, rinse and pat dry prior to dressing wounds Cleanser: Wound Cleanser 1 x Per Day/30 Days Discharge Instructions: Wash your hands with soap and water. Remove old dressing, discard into plastic bag and place into trash. Cleanse the wound with Wound Cleanser prior to applying a clean dressing using gauze sponges, not tissues or cotton balls. Do not scrub or use excessive force. Pat dry using gauze sponges, not tissue or cotton balls. Secondary Dressing: Xtrasorb Large 6x9 (in/in) 1 x Per Day/30 Days Discharge Instructions: Apply to wound as directed. Do not cut. Secured With: [employer] or Non-Sterile 6-ply 4.5x4 (yd/yd) 1 x Per Day/30 Days Discharge Instructions: Apply Kerlix as directed Secured With: Tubigrip Size D, 3x10 (in/yd) 1 x Per Day/30 Days WOUND #23: - Lower Leg Wound Laterality: Right, Posterior Cleanser: Soap and Water 1 x Per Day/30 Days Discharge Instructions: Gently cleanse wound with antibacterial soap, rinse and pat dry prior to dressing wounds Cleanser: Wound Cleanser 1 x Per Day/30 Days Discharge Instructions: Wash your hands with soap and water. Remove old dressing, discard into plastic bag and place into trash. Cleanse the wound with Wound Cleanser prior to applying a clean dressing using gauze sponges, not tissues or cotton balls. Do not scrub or use excessive force. Pat dry using gauze sponges, not tissue or cotton balls. Secondary Dressing: Xtrasorb Large 6x9 (in/in) 1 x Per Day/30 Days Discharge Instructions: Apply to wound as directed. Do not cut. Secured With: [employer] or Non-Sterile 6-ply 4.5x4 (yd/yd) 1 x Per Day/30 Days Discharge Instructions: Apply Kerlix as directed Secured With: Tubigrip Size D, 3x10 (in/yd) 1 x Per Day/30 Days 1. Based on what I am seeing I do believe that the patient would benefit from elevation of his legs which she tells me is try to do. 2. I will recommend to continue with compression he has  been using Tubigrip. 3. I am going to recommend that we continue with the XtraSorb at the facility I feel like this is doing a good job. 4. I am also can recommend that this should continue to be changed daily. I am going to get rid of however the alginate as I feel like this keeping things too moist right up against the wounds. We will see patient back for reevaluation in 1 week here in the clinic. If anything worsens or changes patient  will contact our office for additional recommendations. Electronic Signature(s) Signed: 07/08/2023 7:38:43 PM By: [name] PA-C Entered By: [name] on 07/08/2023 19:38:43 Chad [name] ([id number]) [id number] Page 17 of 17 -------------------------------------------------------------------------------- SuperBill Details Patient Name: Date of Service: BRYCE, GILLMORE 07/07/2023 Medical Record Number: [id number] Patient Account Number: [id number] Date of Birth/Sex: Treating RN: 03-22-1961 (62 y.o. [name] Primary Care Provider: [name] Other Clinician: Referring Provider: Treating Provider/Extender: [name] Weeks in Treatment: 82 Diagnosis Coding ICD-10 Codes Code Description [phone] Chronic venous hypertension (idiopathic) with ulcer and inflammation of left lower extremity 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, stage 3 Facility Procedures : CPT4 Code: [id number] Description: 34742 - WOUND CARE VISIT-LEV 5 EST PT Modifier: Quantity: 1 Physician Procedures : CPT4 Code Description Modifier [id number] - WC PHYS LEVEL 3 - EST PT ICD-10 Diagnosis Description 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 (idiopathic) with ulcer and  inflammation of left lower extremity L98.492 Non-pressure chronic ulcer of skin of  other sites with fat layer exposed 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 Quantity: 1 Electronic Signature(s) Signed: 07/08/2023 7:41:29 PM By: [name] PA-C Previous Signature: 07/07/2023 4:48:20 PM Version By: Midge Aver MSN RN CNS WTA Entered By: [name] on 07/08/2023 19:41:29

## 2023-07-14 ENCOUNTER — Other Ambulatory Visit: Payer: Self-pay | Admitting: Pharmacist

## 2023-07-14 ENCOUNTER — Encounter: Payer: Self-pay | Admitting: Internal Medicine

## 2023-07-14 ENCOUNTER — Encounter: Payer: Medicaid Other | Admitting: Physician Assistant

## 2023-07-14 ENCOUNTER — Other Ambulatory Visit: Payer: Self-pay

## 2023-07-14 ENCOUNTER — Other Ambulatory Visit (HOSPITAL_COMMUNITY): Payer: Self-pay

## 2023-07-14 ENCOUNTER — Ambulatory Visit (INDEPENDENT_AMBULATORY_CARE_PROVIDER_SITE_OTHER): Payer: Medicaid Other | Admitting: Infectious Diseases

## 2023-07-14 VITALS — BP 118/72 | HR 86 | Temp 97.8°F | Resp 16

## 2023-07-14 DIAGNOSIS — B182 Chronic viral hepatitis C: Secondary | ICD-10-CM | POA: Diagnosis present

## 2023-07-14 DIAGNOSIS — Z79899 Other long term (current) drug therapy: Secondary | ICD-10-CM

## 2023-07-14 DIAGNOSIS — I87332 Chronic venous hypertension (idiopathic) with ulcer and inflammation of left lower extremity: Secondary | ICD-10-CM | POA: Diagnosis not present

## 2023-07-14 MED ORDER — SOFOSBUVIR-VELPATASVIR 400-100 MG PO TABS
1.0000 | ORAL_TABLET | Freq: Every day | ORAL | 2 refills | Status: AC
  Filled 2023-07-14: qty 28, 28d supply, fill #0
  Filled 2023-08-03 (×2): qty 28, 28d supply, fill #1
  Filled 2023-08-31 – 2023-10-12 (×3): qty 28, 28d supply, fill #2

## 2023-07-14 NOTE — Progress Notes (Addendum)
NIZAIAH, SCHOEPP ([id number]) [id number] Page 1 of 17 Visit Report for 07/14/2023 Chief Complaint Document Details Patient Name: Date of Service: Chad [name], Chad [name] 07/14/2023 10:15 A M Medical Record Number: [id number] Patient Account Number: [id number] Date of Birth/Sex: Treating RN: 1960-10-13 (62 y.o. Chad [name] Primary Care Provider: [name] Other Clinician: Referring Provider: Treating Provider/Extender: [name] Weeks in Treatment: 61 Information Obtained from: Patient Chief Complaint Left ankle ulcer, Left dorsal foot wound, right heel Electronic Signature(s) Signed: 07/14/2023 10:25:16 AM By: [name] PA-C Entered By: [name] on 07/14/2023 10:25:16 -------------------------------------------------------------------------------- HPI Details Patient Name: Date of Service: Chad [name] BERT 07/14/2023 10:15 A M Medical Record Number: [id number] Patient Account Number: [id number] Date of Birth/Sex: Treating RN: [birth year] (62 y.o. Chad [name] Primary Care Provider: [name] Other Clinician: Referring Provider: Treating Provider/Extender: [name] Weeks in Treatment: 50 History of Present Illness HPI Description: 10/08/18 on evaluation today patient actually presents to our office for initial evaluation concerning wounds that he has of the bilateral lower extremities. 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 He also has bilateral venous stasis. He apparently has been homeless currently following discharge from the hospital apparently he has been placed at [facility] which is is a skilled nursing facility locally. Nonetheless fortunately he does not show any signs of infection at this time which is good news. In fact several of the  wound actually appears to be showing some signs of improvement already in my pinion. There are a couple areas in the left leg in particular there likely gonna require some sharp debridement to help clear away some necrotic tissue and help with more sufficient healing. No fevers, chills, nausea, or vomiting noted at this time. 10/15/18 on evaluation today patient actually appears to be doing very well in regard to his bilateral lower extremities. He's been tolerating the dressing changes without complication. Fortunately there does not appear to be any evidence of active infection at this time which is great news. Overall I'm actually very pleased with how this has progressed in just one visits time. Readmission: 08/14/2020 upon evaluation today patient presents for re-evaluation here in our clinic. He is having issues with his left ankle region as well as his right toe and his right heel. He tells me that the toe and heel actually began as a area that was itching that he was scratching and then subsequently opened up into wounds. These may have been abscess areas I presume based on what I am seeing currently. With regard to his left ankle region he tells me this was a similar type SAFIR, TAULBEE ([id number]) [id number] Page 2 of 17 occurrence although he does have venous stasis this very well may be more of a venous leg ulcer more than anything. Nonetheless I do believe that the patient would benefit from appropriate and aggressive wound care to try to help get things under better control here. He does have history of a stroke on the left side affecting him to some degree there that he is able to stand although he does have some residual weakness. Otherwise again the patient does have chronic venous insufficiency as previously noted. His arterial studies most recently obtained showed that he had an ABI on the right of 1.16 with a TBI of 0.52 and on the left and ABI of 1.14  with a TBI of 0.81. That was obtained on 06/19/2020. 08/28/2020 upon evaluation today patient appears to be doing decently well in regard to his wounds in general. He has been tolerating the dressing changes without complication. Fortunately there does not appear to be any signs of active infection which is great news. With that being said I think the [facility] is doing a good job I would recommend that we likely continue with that currently. 09/11/2020 upon evaluation today patient's wounds did not appear to be doing too poorly but again he is not really showing signs of significant improvement with regard to any of the wounds on the right. None of them have Hydrofera Blue on them I am not exactly sure why this is not being followed as the facility did not contact us to let us know of any issues with obtaining dressings or otherwise. With that being said he is supposed to be using Hydrofera Blue on both of the wounds on the right foot as well as the ankle wound on the left side. 09/18/2020 upon evaluation today patient appears to be doing poorly with regard to his wounds. Again right now the left ankle in particular showed signs of extreme maceration. Apparently he was told by someone with staff at [facility] they could not get the [facility]. With that being said this is something that is never been relayed to [place] one way or another. Also the patient subsequently has not supposed to have a border gauze dressing on. He should have an ABD pad and roll gauze to secure as this drains much too much just to have a border gauze dressing to cover. Nonetheless the fact that they are not using the appropriate dressing is directly causing deterioration of the left ankle wound it is significantly worse today compared to what it was previous. I did attempt to call [facility] while the patient was here I called three times and got no one to even pick up the phone. After this I had my for an  office coordinator call and she was able to finally get through and leave a message with the D ON as of dictation of this note which is roughly about an hour and a half later I still have not been able to speak with anyone at the facility. 09/25/2020 upon evaluation today patient actually showing signs of good improvement which is excellent news. He has been tolerating the dressing changes without complication. Fortunately there is no signs of active infection which is great news. No fevers, chills, nausea, vomiting, or diarrhea. I do feel like the facility has been doing a much better job at taking care of him as far as the dressings are concerned. However the director of nursing never did call me back. 10/09/2020 upon evaluation today patient appears to be doing well with regard to his wound. The toe ulcer did require some debridement but the other 2 areas actually appear to be doing quite well. 10/19/2020 upon evaluation today patient actually appears to be doing very well in regard to his wounds. In fact the heel does appear to be completely healed. The toe is doing better in the medial ankle on the left is also doing better. Overall I think he is headed in the right direction. 10/26/2020 upon evaluation today patient appears to be doing well with regard to his wound. He is showing signs of improvement which is great news and overall I am very pleased with where things stand today. No  fevers, chills, nausea, vomiting, or diarrhea. 11/02/2020 upon evaluation today patient appears to be doing well with regard to his wounds. He has been tolerating the dressing changes without complication overall I am extremely pleased with where things stand today. He in regard to the toe is almost completely healed and the medial ankle on the left is doing much better. 11/09/2020 upon evaluation today patient appears to be doing a little poorly in regard to his left medial ankle ulcer. Fortunately there does not appear to  be any signs of systemic infection but unfortunately locally he does appear to be infected in fact he has blue-green drainage consistent with Pseudomonas. 11/16/2020 upon evaluation today patient appears to be doing well with regard to his wound. It actually appears to be doing better. I did place him on gentamicin cream since the Cipro was actually resistant even though he was positive for Pseudomonas on culture. Overall I think that he does seem to be doing better though I am unsure whether or not they have actually been putting the cream on. The patient is not sure that we did talk to the nurse directly and she was going to initiate that treatment. Fortunately there does not appear to be any signs of active infection at this time. No fevers, chills, nausea, vomiting, or diarrhea. 4/28; the area on the right second toe is close to healed. Left medial ankle required debridement 12/07/2020 upon evaluation today patient appears to be doing well with regard to his wounds. In fact the right second toe appears to be completely healed which is great news. Fortunately there does not appear to be any signs of active infection at this time which is also great news. I think we can probably discontinue the gentamicin on top of everything else. 12/14/2020 upon evaluation today patient appears to be doing well with regard to his wound. He is making good progress and overall very pleased with where things stand today. There is no signs of active infection at this time which is great news. 12/28/2020 upon evaluation today patient appears to be doing well with regard to his wounds. He has been tolerating the dressing changes without complication. Fortunately there is no signs of active infection at this time. No fevers, chills, nausea, vomiting, or diarrhea. 12/28/2020 upon evaluation today patient's wound bed actually showed signs of excellent improvement. He has great epithelization and granulation I do not see any  signs of infection overall I am extremely pleased with where things stand at this point. No fevers, chills, nausea, vomiting, or diarrhea. 01/11/2021 upon evaluation today patient appears to be doing well with regard to his wound on his leg. He has been tolerating the dressing changes without complication. Fortunately there does not appear to be any signs of active infection which is great news. No fevers, chills, nausea, vomiting, or diarrhea. 01/25/2021 upon evaluation today patient appears to be doing well with regard to his wound. He has been tolerating the dressing changes without complication. Fortunately the collagen seems to be doing a great job which is excellent news. No fevers, chills, nausea, vomiting, or diarrhea. 02/08/2021 upon evaluation today patient's wound is actually looking a little bit worse especially in the periwound compared to previous. Fortunately there does not appear to be any signs of infection which is great news with that being said he does have some irritation around the periphery of the wound which has me more concerned. He actually had a dressing on that had not been changed in 3  days. He also is supposed to have daily dressing changes. With regard to the dressing applied he had a silver alginate dressing and silver collagen is what is recommended and ordered. He also had no Desitin around the edges of the wound in the periwound region although that is on the order inspect to be done as well. In general I was very concerned I did contact [facility] actually spoke with Verlon Au who is the scheduling individual and subsequently she stated that she would pass the information to the D ON apparently the D ON was not available to talk to me when I call today. 02/18/2021 upon evaluation today patient's wound is actually showing signs of improvement. Fortunately there does not appear to be any evidence of infection which is great news overall I am extremely pleased with  where things stand today. No fevers, chills, nausea, vomiting, or diarrhea. 8/3; patient presents for 1 week follow-up. He has no issues or complaints today. He denies signs of infection. 03/11/2021 upon evaluation today patient appears to be doing well with regard to his wound. He does have a little bit of slough noted on the surface of the wound but fortunately there does not appear to be any signs of active infection at this time. No fevers, chills, nausea, vomiting, or diarrhea. 03/18/2021 upon evaluation today patient appears to be doing well with regard to his wound. He has been tolerating the dressing changes without complication. There was a little irritation more proximal to where the wound was that was not noted last week but nonetheless this is very superficial just seems to be more irritation we just need to make sure to put a good amount of the zinc over the area in my opinion. Otherwise he does not seem to be doing significantly worse at all which is great news. 03/25/2021 upon evaluation today patient appears to be doing well with regard to his wound. He is going require some sharp debridement today to clear with MANNY, WOLLERT ([id number]) [id number] Page 3 of 17 some of the necrotic debris. I did perform this today without complication postdebridement wound bed appears to be doing much better this is great news. 04/08/2021 upon evaluation today patient appears to be doing decently well in regard to his wound although the overall measurement is not significantly smaller compared to previous. It is gone down a little bit but still the facility continues to not really put the appropriate dressings in place in fact he was supposed to have collagen we think he probably had more of an allergy to At this point. Fortunately there does not appear to be any signs of active infection systemically though locally I do not see anything on initial visualization either as  far as erythema or warmth. 04/15/2021 upon evaluation today patient appears to be doing well with regard to his wound. He is actually showing signs of improvement. I did place him on antibiotics last week, Cipro. He has been taking that 2 times a day and seems to be tolerating it very well. I do not see any evidence of worsening and in fact the overall appearance of the wound is smaller today which is also great news. 9/26; left medial ankle chronic venous insufficiency wound is improved. Using Hydrofera Blue 10/10; left medial ankle chronic venous insufficiency. Wound has not changed much in appearance completely nonviable surface. Apparently there have been problems getting the right product on the wound at the facility although he came in with [facility] on today  05/14/2021 upon evaluation today patient appears to be doing well with regard to his wound. I think he is making progress here which is good news. Fortunately there does not appear to be any signs of active infection at this time. No fevers, chills, nausea, vomiting, or diarrhea. 05/20/2021 upon evaluation today patient appears to be doing well with regard to his wound. He is showing signs of good improvement which is great news. There does not appear to be any evidence of active infection which is also excellent news. No fevers, chills, nausea, vomiting, or diarrhea. 05/28/2021 upon evaluation today patient appears to be doing quite well. There does not appear to be any signs of active infection at this time which is great news. Overall I am extremely pleased with where things stand today. I think he is headed in the right direction. 06/11/2021 upon evaluation today patient appears to be doing well with regard to his left ankle ulcer and poorly in regard to the toe ulcer on the second toe right foot. This appears to show signs of joint exposure. Apparently this has been present for 1 to 2 months although he kept forgetting to tell me  about it. That is unfortunate as right now it definitely appears to be doing significantly worse than what I would like to see. There does not appear to be any signs of active infection systemically though locally I am concerned about the possibility of infection the toe is quite red. Again no one from the facility ever contacted [place] to advise that this was going on in the interim either. 06/17/2021 upon evaluation today patient presents for follow-up I did review his x-ray which showed a navicular bone fracture I am unsure of the chronicity of this. Subsequently he also had osteomyelitis of the toe which was what I was more concerned about this did not show up on x-ray but did show up on the pathology scrapings. This was listed as acute osteomyelitis. Nonetheless at this point I think that the antibiotic treatment is the best regimen to go with currently. The patient is in agreement with that plan. Nonetheless he has initially 30 days of doxycycline off likely extend that towards the end of the treatment cycle that will be around the middle of December for an additional 2 weeks. That all depends on how well he continues to heal. Nonetheless based on what I am seeing in the foot I did want a proceed with an MRI as well which I think will be helpful to identify if there is anything else that needs to be addressed from the standpoint of infection. 06/24/2021 upon evaluation today patient appears to be doing pretty well in regards to his wounds. I think both are actually showing signs of improvement which is good I did review his MRI today which did show signs of osteomyelitis of the middle and proximal phalanx on his right foot of the affected toe. With that being said this is actually showing signs of significant improvement today already with the antibiotic therapy I think the redness is also improved. Overall I think that we just need to give this some time with appropriate wound care we will see how  things go potentially hyperbarics could be considered. 07/02/2021 upon inspection today patient actually appears to be doing well in regard to his left ankle which is getting very close to complete resolution of pleased in that regard. Unfortunately he is continuing to have issues with his second toe right foot and this seems  to still be very painful for him. Recommend he try something different from the standpoint of antibiotics. 07/15/2021 upon evaluation today patient appears to be doing actually pretty well in regard to his foot. This is actually showing signs of significant improvement which is great news. Overall I feel like the patient is improving both in regard to the second toe as well as the ankle on the left. With that being said the biggest issue that I do see currently is that he is needing to have a refill of the doxycycline that we previously treated him with. He also did see podiatry they are not going to recommend any amputation at this point since he seems to be doing quite well. For that reason we just need to keep things under control from an infection standpoint. 08/01/2021 upon evaluation today patient appears to be doing well with regard to his wound. He has been tolerating the dressing changes without complication. Fortunately there does not appear to be any evidence of active infection locally nor systemically at this point. In fact I think everything is doing excellent in fact his second toe on the right foot is almost healed and the ankle on the left ankle region is actually very close to being healed as well. 08/08/2021 upon evaluation today patient appears to be doing well with regard to his wound. He has been tolerating the dressing changes without complication. Fortunately I do not see any signs of active infection at this time. Readmission: 12-06-2021 upon evaluation today patient presents for reevaluation here in the clinic he does tell me that he was being seen in  facility at [facility] by a provider that was coming in. He is not sure who this was. He tells me however that the wound seems to have gotten worse even compared to where it was when we last saw him at this point. With that being said I do believe that he is likely going need ongoing wound care here in the clinic and I do believe that we need to be the ones to frontline this since his wound does seem to be getting worse not better at this point. He voiced understanding. He is also in agreement with this plan and feels more comfortable coming here she tells me. Patient's medical history really has not changed since his prior admission he was only gone since January. 12-27-2021 upon evaluation today patient appears to be doing well with regard to his wound they did run out of the [facility] so they did not put anything on just an ABD pad with gentamicin. Still we are seeing some signs of good improvement here with some new epithelization which is great news. 01-10-2022 upon evaluation today patient appears to be doing well with regard to his wounds and he is going require some sharp debridement but overall seems to be making good progress. Fortunately I do not see any evidence of active infection locally or systemically at this time which is great news. 01-24-2022 upon evaluation today patient appears to be doing well with regard to his wound. The facility actually came and dropped him off early and he had another appointment at the hospital and then they just brought him over here and this was still hours before his appointment this afternoon. For that reason we did do our best to work him in this morning and fortunately had some space to make this happen. With that being said patient's wound does seem to be making progress here and I  am very pleased in that regard I do not see any signs of active infection locally or systemically at this time. 02-07-2022 patient appears to be doing well  currently in regard to his wounds. In fact one of them the more proximal is healed the distal is still open but seems to be doing excellent. Fortunately I do not see any evidence of active infection locally or systemically at this time which is great news. No fevers, chills, nausea, vomiting, or diarrhea. 7/27; left medial ankle venous. Improving per our intake nurse. We are using Hydrofera Blue under Tubigrip compression. They are changing that at his facility 03-06-2022 upon evaluation today patient appears to be doing well with regard to the his wound he is can require some sharp debridement but seems to be making excellent progress. Fortunately I do not see any evidence of active infection locally or systemically which is great news. 03-27-2022 upon evaluation today patient's wound is actually showing signs of excellent improvement. Fortunately I see no signs of active infection locally or systemically at this time which is great news. No fevers, chills, nausea, vomiting, or diarrhea. CARTHEL, JANNUSCH ([id number]) [id number] Page 4 of 17 04-21-2022 upon evaluation today patient appears to be doing excellent in regard to his wound in fact this is very close to resolution based on what I am seeing. I do not see any evidence of active infection locally or systemically at this time which is great news and overall I am extremely pleased with where we are today. 05-05-2022 upon evaluation today patient's wound actually appears to potentially be completely healed. Fortunately I do not see any evidence of active infection at this time which is great news and overall I am very pleased I think this needs a little time to toughen up but other than that I really do believe were doing quite well. He is very pleased to hear this its been a long time coming. 05-19-2022 upon evaluation today patient appears to be doing well currently in regard to his wound. He in fact we were hoping will  be completely healed and closed but it still has a very tiny area right in the center which is still continuing to drain. Fortunately I do not see any signs of infection locally or systemically which is great news. 11/6; this wound is close to completely" he comes in this week with some erythema at 1 edge of this which looks like something was rubbing on the area. Other than that no evidence of infection 06-16-2022 upon evaluation today patient appears to be doing well currently in regard to his ankle ulcer. This is very close to being healed but still has a small area in the central portion which is not. Fortunately there does not appear to be any signs of infection locally or systemically which is great news. No fevers, chills, nausea, vomiting, or diarrhea. 06-30-2022 upon evaluation patient's wound pretty much appears to be almost completely closed. In fact it may even be closed but there are still a lot of skin irritation around the edges of the wound and I just do not feel great about releasing them yet I would like to continue to monitor this just a little bit longer before getting to that point. He is not opposed to this and in fact is happy to continue to come in if need be in order to make sure that things are moving in the right direction he definitely does not want to backtrack. 07-14-2022 upon evaluation today patient  appears to be doing well currently in regard to his wound. Has been tolerating the dressing changes without complication. Fortunately I see no evidence of active infection locally nor systemically which is great news. No fevers, chills, nausea, vomiting, or diarrhea. 08-11-2022 upon evaluation today patient appears to be doing a little worse than last time I saw him although it has been a month. Unfortunately he was not able to be seen due to the fact that he was quarantined in the facility with COVID. Subsequently he tells me that he mention to the facility staff several  times about taking it off over the past several weeks but they continue to tell him that someone have to get in touch with [place] here although nobody ever did until Friday when the nurse manager called to have that documented in the communication notes. With that being said unfortunately this means that he had a wrap on that he was supposed to have on for only 1 week for ended up being on four 1 month essentially. I am glad that there is nothing worse than what we see currently to be perfectly honest. 08-19-2022 upon evaluation today patient appears to be doing well currently in regard to his wounds. He has been tolerating the dressing changes without complication. This is actually smaller today which is great news after last week's reopening. Fortunately I do not see any evidence of infection locally nor systemically at this point. 08-26-2022 upon evaluation today patient appears to be doing well currently in regard to his wound. He has been tolerating the dressing changes without complication. Fortunately there does not appear to be any signs of active infection locally nor systemically which is great news. No fevers, chills, nausea, vomiting, or diarrhea. 09-02-2022 upon evaluation today patient's wound actually showing signs of excellent improvement this is measuring smaller and looking much better. I am very pleased with where we stand I do believe that we are headed in the right direction. 09-09-2022 upon evaluation today patient appears to be doing decently well in regard to his leg in general although there was some swelling this is the 1 thing that I am not too happy with based on what I see currently. Fortunately there does not appear to be any signs of infection locally nor systemically at this point. 09-16-2022 upon evaluation today patient appears to be doing well currently in regard to his wound. He is actually showing signs of improvement he wore the Tubigrip this week and it significantly  better compared to last week's evaluation. Fortunately I do not see any evidence of active infection locally or systemically which is great news. Overall I think that were headed in the right direction which is great news. In fact overall I think this is the best that he has looked in some time. He does not like the Tubigrip but actually did extremely well with this in my opinion. I think he needs to continue to utilize this. 09-23-2022 upon evaluation today patient appears to be doing well currently in regard to his wound. He has been tolerating the dressing changes without complication. Fortunately there does not appear to be any signs of active infection locally nor systemically which is great news and in general I do feel like that we are headed in the right direction. He has been having trouble with the Tubigrip however we have gotten the facility to order compression socks he has not gotten them as of yet. 3/4; patient is about the same with a wound  on the left posterior leg. This is a venous wound. He needs more compression on this leg but he took off the previous wraps we are using. I am not certain whether he is using juxta lite stockings at the nursing home. 3/12; patient presents for follow-up. He unfortunately has 2 new wounds 1 to the dorsal left side and the other to the right heel. He is not sure how these happened. The right heel appears to be caused by pressure. He has been using Hydrofera Blue to the original wound on the left leg. 10-14-2022 upon evaluation today patient appears to be doing well currently in regard to his original wound but unfortunately he has several new wounds which I was completely unaware of before walking into the room to see him today. Unfortunately he seems to not be doing nearly as well as what I saw when I last had an appointment with him 3 weeks ago. Fortunately I do not see any signs of systemic infection though unfortunately locally he has definite  infection of the left lower extremity. He is currently on doxycycline I am going to likely have to extend that today. 10-21-2022 upon evaluation today patient appears to be doing well currently in regard to his wounds. He seems to be tolerating the dressing changes without complication. Fortunately there does not appear to be any signs of active infection locally or systemically which is great news and in general I definitely feel like we are on the right track here. 10-30-2022 upon evaluation today patient still continues to not be doing too well at all with regard to his wounds. Unfortunately he is still having signs of infection as well which also has been concerned. Fortunately I do not see any signs of active infection locally nor systemically at this time. No fevers, chills, nausea, vomiting, or diarrhea. 11-06-2022 upon evaluation today patient appears to be doing okay currently in regard to his wounds. All things considered with his blood flow not being nearly as good as what should I think that he is making pretty good progress here. Fortunately I do not see any signs of infection locally nor systemically which is great news. No fevers, chills, nausea, vomiting, or diarrhea. 11-13-2022 upon evaluation today patient appears to be doing poorly currently in regard to his wounds. Things seem to be getting a little bit worse not better. 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 Subsequently he is also going to need to have vascular intervention but we are unsure exactly when this is going on the undertaking. I would try to actually send a message to Dr. Hazle Quant and the vascular team just to see what exactly the plan is going forward. The patient is in agreement with that plan I just think we need to have a plan especially in light of the fact the left leg seems to be getting a little bit worse at this  point. 11-25-2022 upon evaluation today patient is currently now discharged from the hospital following his colostomy procedure. That did very well and he seems to [facility], [name] ([id number]) [id number] Page 5 of 17 be doing well. Fortunately there does not appear to be any signs of active infection locally or systemically which is great news. No fevers, chills, nausea, vomiting, or diarrhea. 12-04-2022 upon evaluation today patient appears to be doing actually decently well in regard to his wounds and actually very pleased  with where we stand and I think that he is making some pretty good progress here. Fortunately I do not see any signs of active infection locally nor systemically which is great news. No fevers, chills, nausea, vomiting, or diarrhea. 12-12-2022 upon evaluation today patient appears to be doing well currently in regard to his wound. He has been tolerating the dressing changes without complication. The wounds all seem to be making some progress to a degree. He has appointment with vascular on the 23rd of this month. 5/24; the patient was scheduled for a left lower extremity angiogram yesterday but apparently they were overbooked and they wanted to admit him to hospital last night so they could do this early this morning but he refused I see that Dr. Wyn Quaker has rescheduled him for June 6. He is using silver alginate to wounds on his bilateral lower extremities. 01-02-2023 upon evaluation today patient appears to be doing poorly in regard to his wounds still. Subsequently he did have his angiogram but apparently there really was not much that was found that could be revascularized. The patient tells me that Dr. Wyn Quaker was going to contact me here in the office I have not heard from him as of yet and I may see about giving him a call just to follow-up or possibly send a message through epic which could be an option as well. 01-09-2023 upon evaluation today patient  appears to be doing poorly in regard to his legs he is extremely swollen. He has been sleeping in his chair he tells me for the past week. His bed is broken and they have ordered a new one which is supposedly supposed to be there today. With that being said he the way he needs to be not sleeping in his chair that is his wheelchair every day and through the night. This is causing his legs to be much more swollen and it seen and how badly wounds are doing at this point. This has made things significantly worse. Fortunately I do not see any signs of infection right now unfortunately I do believe that the patient has a tremendous amount of drainage currently. 01-19-2023 upon evaluation today patient appears to be doing poorly in regard to his wounds of his legs his swelling is better but still not as good as what we would like to see. Fortunately I do not see any evidence of active infection locally nor systemically which is great news and in general I do believe that we are moving in the right direction here. Nonetheless it would be better if she was in the bed sleeping or not send up in his wheelchair although he seems to still be set up in the wheelchair at this time. He tells me that he is just not as comfortable in the bed as the wheelchair but unfortunately this is causing damage to his legs which I discussed with him today as well. Nonetheless I am not sure that he is going to be amendable to doing anything different. 01-27-2023 upon evaluation today patient appears to be doing well currently. Fortunately I do not see any evidence of active infection locally nor systemically which is great news I think he is actually doing the best option and while in fact I would try to see if we can do some debridement today to get things moving along. 02-03-2023 upon evaluation today patient's wounds unfortunately are not doing nearly as well. Will be doing the [employer] wraps unfortunately we just not seeing the  improvement that I would like to see. Fortunately I do not see any evidence of infection at this time which is good news unfortunately I do think that he needs to be changed on a daily basis due to the amount of drainage otherwise I do not think this is really going to continue to improve dramatically. 02-12-2023 upon evaluation today patient appears to be doing better in a lot of ways in regard to his wounds and although his toes on the left side actually seem a bit worse. Fortunately I do not see any evidence of active infection locally nor systemically at this time which is good news. With that being said he is having some bleeding from the left leg I think that this is an area that looks like it probably got bumped. With that being said I discussed this with the patient he tells me he does not know of any trauma that occurred but at the same time if was not this that it was skin that got pulled off. Dressing change there is only one of the 2 that could have happened. 7/25; this is a patient who is very frail at [facility]. He is nonambulatory. He has wounds on his bilateral lower legs. However much worse on the left we have been using silver cell to all of his wounds He comes in today and a lot more pain our intake nurse reports deterioration of the left foot. I ended up looking through some of his record. 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 I am really not certain of the stage of  this currently or who is following 02-26-2023 upon evaluation today patient unfortunately appears to be doing significantly worse compared to where he was previous. The left foot is showing signs of dramatic worsening in regard to the overall appearance his toes are starting to become necrotic almost in full and he has much more cyanosis present than what I even seen previous. He is already seeing Dr. Wyn Quaker and unfortunately there was not anything that they saw that was an immediate cause for this but at the same time I feel like this may have changed and even worsened I feel like this could be a vascular event and I believe this is a limb-threatening situation. 03/26/2023; I last saw this patient on 8/1. Wondered about below-knee amputations versus palliative care. 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 The wound has healed fairly well and he follows up with surgery today. He is back today looking at wounds on the right foot and lower leg. This includes 2 areas on the right heel, right dorsal foot right dorsal first and second toes and a small area on the right anterior lower tibia area. He has a lot of pain in this leg as well. The angiogram he had on 01/01/2023 specific to the right leg now demonstrated a fairly normal common femoral artery, profunda femoris artery and superficial and popliteal arteries without significant disease. 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 No doubt this has a lot to do with the discomfort in the right leg he describes when he is in bed. They have been using Xeroform on the wounds on the right. He has a lot of swelling in this leg with weeping edema 04-09-2023 upon evaluation today patient appears to be doing poorly still in regard to his right leg. 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  Fortunately I do not see any signs of active infection locally or systemically at this time which is great news. No fevers, chills, nausea, vomiting, or diarrhea. With that being said I am concerned still about the fact that he seems to be getting worse especially in regard to the heels I think that he is still sitting up most of the day which I think is still causing some swelling of his leg unfortunately. 04-16-2023 upon evaluation today patient actually appears to be doing little bit more poorly compared to last week this actually does appear to be infected. Last week I really did not think that was the case but this seems like it may indeed be the case. Fortunately I do not see any signs of active infection locally or systemically at this time. 04-24-2023 upon evaluation today patient appears to be doing well currently in regard to his leg compared to where we were previous I do feel like that the antibiotics have been of benefit. Fortunately I do not see any signs of active infection at this time which is great news and in general I believe that we are making headway towards healing which is great news as well. With that being said he still has a lot of drainage I really feel like something like Anola Gurney would be better for him than just an ABD pad it would likely the drainage more or even Zetuvit's but I am not sure whether we can get either 1 of these at the facility for him. 05-01-2023 upon evaluation today patient appears to be doing well currently in regard to his wounds which are showing signs of significant improvement is not any way shape or form infected like it was last time I saw him. I think the antibiotics are doing a good job but I think he may need an extension here to ensure GRALIN, SHIPES ([id number]) [id number] Page 6 of 17 that it completely clears. 05-08-2023 upon evaluation today patient appears to be doing well currently in regard to his wounds for  the most part. He still has blue-green drainage noted but in general seems to be doing much better compared to where we were previous. Fortunately I do not see any signs of active infection locally or systemically which is great news and in general I do believe that we are making good headway towards complete closure. 05-18-2023 upon evaluation today patient appears to be doing well currently in regard to his wounds I feel like he is definitely making some improvements here. This is a slow improvement but nonetheless improvement. I do believe that he is doing quite well as far as infection is concerned which is also good news. 05-25-2023 upon evaluation today patient actually appears to be doing worse in regard to his legs at this point. Unfortunately he continues to have issues with significant drainage which is the primary concern here. I do feel like that the wound currently is not doing a whole lot better  when he was on the Levaquin I felt like it was improving some but the concern there is QT prolongation I do not want to continue that personally further I think he needs a referral ASAP to infectious disease. I discussed that with him today. He also needs to be having this changed every day and that has not been happening at this point. That is my biggest concern here. 06-09-2023 upon evaluation today patient appears to be doing still poorly in regard to this right leg which is draining profusely. This is unfortunately just continuing to show signs of being very moist and wet and weeping quite significantly. This is despite everything that we are doing including the facility have been placed him on Levaquin by ID which unfortunately just does not seem to be helping to heal this as well as we would hope either. I do have an infectious disease appointment that we sent 10 for him and that is actually scheduled for next week on the 19th. He has an appointment with vascular on the 14th of this week  although I think they are looking at his left leg primarily at the amputation site. 06-17-2023 upon evaluation today patient appears to be doing still poorly in regard to his legs at this point. Unfortunately he still seems to be having issues with significant drainage we are having this changed every single day. With that being said despite this the patient just does not seem to be making a lot of headway here towards closure. We are trying to do what we can from the standpoint of compression as well. He did see infectious disease yesterday. I did review that note as well. Upon evaluation the note actually states that the patient told the physician "do I have to keep suffering with wounds forevero Is there a way to get rid of these woundso However he did not want a referral to see Dr. Lajoyce Corners for potential amputation which seems to be 1 option that was proposed. With that being said he also apparently told her that he was "adamant about not doing another course of antibiotics as he feels like they have not healed his wounds". It appears based on evaluation with regard to the record that the patient did have a discussion with her about the possibility of doing a short course of IV cefepime for 14 days to cover [place] as well as the other bacteria noted on culture. However the patient declined this as he felt like the antibiotics in the past he had taken did not help at all and there were no signs and symptoms to suggest sepsis. Therefore she recommended continuing wound care and to continue to follow-up with vascular follow-up in 2 weeks with her. I appreciate this evaluation and again I cannot do anything with the fact that the patient did not want to proceed with any further treatment with regard to the bacterial infection. 06-23-2023 the patient seems to making some progress here in regard to his wound. I feel like it is still a tremendous amount of drainage but at the same time I feel like you are  also showing some signs of significant improvement which is great news and very pleased with where things stand currently. Especially compared to where we have been. 07-07-2023 upon evaluation today patient appears to be doing about the same currently in regard to his wounds. He does not seem quite as erythematous as he has been around the wounds but the wounds continue to get larger due  to the swelling and the drainage. Again I think the moisture is causing additional and further breakdown which is unfortunate. With that being said I discussed with the patient that I do believe that the only way to get this better is going to be to keep the swelling under control and the drainage off of his skin. This is the hardest part to be sure. I think the alginate dressing is being used at the facility is actually keeping things more moist which is not ideal. 07-15-2023 upon evaluation today patient appears to be doing poorly still in regard to his wounds he still continues to have significant issues. Fortunately I do not see any signs of active infection which is great news and in general I am pleased in that regard but at the same time I do believe that he is not doing quite as well as he could be especially in light of the fact that he still did put the alginate on which is gets very wet and stays completely soaked on the wounds on trying to get this off and just use the XtraSorb which would think be much better. Electronic Signature(s) Signed: 07/15/2023 5:02:39 PM By: [name] PA-C Entered By: [name] on 07/15/2023 17:02:39 -------------------------------------------------------------------------------- Physical Exam Details Patient Name: Date of Service: KOLTYN, WOHLFARTH 07/14/2023 10:15 A M Medical Record Number: [id number] Patient Account Number: [id number] Date of Birth/Sex: Treating RN: 1961/01/21 (62 y.o. Chad [name] Primary Care Provider: [name] Other Clinician: Referring  Provider: Treating Provider/Extender: [name] Weeks in Treatment: 87 Constitutional Well-nourished and well-hydrated in no acute distress. Respiratory normal breathing without difficulty. Psychiatric this patient is able to make decisions and demonstrates good insight into disease process. Alert and Oriented x 3. pleasant and cooperative. BESIM, CHERRY ([id number]) [id number] Page 7 of 17 Notes Unfortunately patient's wounds are not doing as well as I would like to see him try to get the drainage to stay off of the skin which I think is the primary concern here to be honest. We are going tried to reinforce that we do not want the alginate to be on the wounds just having the Zetuvit only in order to try to prevent this from staying so wet he is in agreement with this plan and we will see how things go. Electronic Signature(s) Signed: 07/15/2023 5:03:16 PM By: [name] PA-C Entered By: [name] on 07/15/2023 17:03:16 -------------------------------------------------------------------------------- Physician Orders Details Patient Name: Date of Service: RAYJON, LOVIN 07/14/2023 10:15 A M Medical Record Number: [id number] Patient Account Number: [id number] Date of Birth/Sex: Treating RN: 22-May-1961 (62 y.o. Chad [name] Primary Care Provider: [name] Other Clinician: Referring Provider: Treating Provider/Extender: [name] in Treatment: 60 The following information was scribed by: Midge Aver The information was scribed for: [name] Verbal / Phone Orders: No Diagnosis Coding ICD-10 Coding Code Description [phone] Chronic venous hypertension (idiopathic) with ulcer and inflammation of left lower extremity 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 of right foot with other specified severity I70.245  Atherosclerosis of native arteries of left leg with ulceration of other part of foot 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 Nurse Visit as needed Bathing/ [employer] wounds with antibacterial soap and water. - LEAVE OFF THE CALCIUM ALGINATE BECAUSE OF SO MUCH DRAINAGE. XTRASORB ONLY EXCEPT PLACE HYDROFERA BLUE STRIPS BETWEEN THE  TOES. CHANGE DAILY , No tub bath. Anesthetic (Use 'Patient Medications' Section for Anesthetic Order Entry) Lidocaine applied to wound bed Off-Loading Heel suspension boot - when in bed Turn and reposition every 2 hours Wound Treatment Wound #16 - Calcaneus Wound Laterality: Right Cleanser: Soap and Water 1 x Per Day/30 Days Discharge Instructions: Gently cleanse wound with antibacterial soap, rinse and pat dry prior to dressing wounds Cleanser: Wound Cleanser 1 x Per Day/30 Days Discharge Instructions: Wash your hands with soap and water. Remove old dressing, discard into plastic bag and place into trash. Cleanse the wound with Wound Cleanser prior to applying a clean dressing using gauze sponges, not tissues or cotton balls. Do not scrub or use excessive force. Pat dry using gauze sponges, not tissue or cotton balls. Secondary Dressing: Xtrasorb Large 6x9 (in/in) 1 x Per Day/30 Days Discharge Instructions: Apply to wound as directed. Do not cut. DONNOVAN, DELASHMIT ([id number]) [id number] Page 8 of 17 Secondary Dressing: Heel Cup 1 x Per Day/30 Days Secured With: Kerlix Roll Sterile or Non-Sterile 6-ply 4.5x4 (yd/yd) 1 x Per Day/30 Days Discharge Instructions: Apply Kerlix as directed Secured With: Tubigrip Size D, 3x10 (in/yd) 1 x Per Day/30 Days Wound #18 - T Second oe Wound Laterality: Right, Medial Cleanser: Soap and Water 1 x Per Day/30 Days Discharge Instructions: Gently cleanse wound with antibacterial soap, rinse and pat dry prior to dressing  wounds Cleanser: Wound Cleanser 1 x Per Day/30 Days Discharge Instructions: Wash your hands with soap and water. Remove old dressing, discard into plastic bag and place into trash. Cleanse the wound with Wound Cleanser prior to applying a clean dressing using gauze sponges, not tissues or cotton balls. Do not scrub or use excessive force. Pat dry using gauze sponges, not tissue or cotton balls. Prim Dressing: Hydrofera Blue Ready Transfer Foam, 2.5x2.5 (in/in) 1 x Per Day/30 Days ary Discharge Instructions: Apply Hydrofera Blue Ready to wound bed as directed Secondary Dressing: Xtrasorb Large 6x9 (in/in) 1 x Per Day/30 Days Discharge Instructions: Apply to wound as directed. Do not cut. Secured With: [employer] or Non-Sterile 6-ply 4.5x4 (yd/yd) 1 x Per Day/30 Days Discharge Instructions: Apply Kerlix as directed Secured With: Tubigrip Size D, 3x10 (in/yd) 1 x Per Day/30 Days Wound #21 - Foot Wound Laterality: Dorsal, Right Cleanser: Soap and Water 1 x Per Day/30 Days Discharge Instructions: Gently cleanse wound with antibacterial soap, rinse and pat dry prior to dressing wounds Cleanser: Wound Cleanser 1 x Per Day/30 Days Discharge Instructions: Wash your hands with soap and water. Remove old dressing, discard into plastic bag and place into trash. Cleanse the wound with Wound Cleanser prior to applying a clean dressing using gauze sponges, not tissues or cotton balls. Do not scrub or use excessive force. Pat dry using gauze sponges, not tissue or cotton balls. Secondary Dressing: Xtrasorb Large 6x9 (in/in) 1 x Per Day/30 Days Discharge Instructions: Apply to wound as directed. Do not cut. Secured With: [employer] or Non-Sterile 6-ply 4.5x4 (yd/yd) 1 x Per Day/30 Days Discharge Instructions: Apply Kerlix as directed Secured With: Tubigrip Size D, 3x10 (in/yd) 1 x Per Day/30 Days Wound #22 - Lower Leg Wound Laterality: Right, Anterior Cleanser: Soap and Water 1 x Per  Day/30 Days Discharge Instructions: Gently cleanse wound with antibacterial soap, rinse and pat dry prior to dressing wounds Cleanser: Wound Cleanser 1 x Per Day/30 Days Discharge Instructions: Wash your hands with soap and water. Remove old dressing, discard into plastic bag and place into  trash. Cleanse the wound with Wound Cleanser prior to applying a clean dressing using gauze sponges, not tissues or cotton balls. Do not scrub or use excessive force. Pat dry using gauze sponges, not tissue or cotton balls. Secondary Dressing: Xtrasorb Large 6x9 (in/in) 1 x Per Day/30 Days Discharge Instructions: Apply to wound as directed. Do not cut. Secured With: [employer] or Non-Sterile 6-ply 4.5x4 (yd/yd) 1 x Per Day/30 Days Discharge Instructions: Apply Kerlix as directed Secured With: Tubigrip Size D, 3x10 (in/yd) 1 x Per Day/30 Days Wound #23 - Lower Leg Wound Laterality: Right, Posterior Cleanser: Soap and Water 1 x Per Day/30 Days Discharge Instructions: Gently cleanse wound with antibacterial soap, rinse and pat dry prior to dressing wounds Cleanser: Wound Cleanser 1 x Per Day/30 Days Discharge Instructions: Wash your hands with soap and water. Remove old dressing, discard into plastic bag and place into trash. Cleanse the wound with Wound Cleanser prior to applying a clean dressing using gauze sponges, not tissues or cotton balls. Do not scrub or use excessive force. Pat dry using gauze sponges, not tissue or cotton balls. Secondary Dressing: Xtrasorb Large 6x9 (in/in) 1 x Per Day/30 Days Discharge Instructions: Apply to wound as directed. Do not cut. Secured With: [employer] or Non-Sterile 6-ply 4.5x4 (yd/yd) 1 x Per Day/30 Days Discharge Instructions: Apply Kerlix as directed Secured With: Tubigrip Size D, 3x10 (in/yd) 1 x Per Day/30 Days ENSAR, AUNGST ([id number]) [id number] Page 9 of 17 Electronic Signature(s) Signed: 07/14/2023 4:17:14 PM  By: Midge Aver MSN RN CNS WTA Signed: 07/17/2023 12:16:54 PM By: [name] PA-C Entered By: Midge Aver on 07/14/2023 11:09:19 -------------------------------------------------------------------------------- Problem List Details Patient Name: Date of Service: BUNYAN, MOLE 07/14/2023 10:15 A M Medical Record Number: [id number] Patient Account Number: [id number] Date of Birth/Sex: Treating RN: 1961/07/24 (62 y.o. [place] [name] Primary Care Provider: [name] Other Clinician: Referring Provider: Treating Provider/Extender: [name] in Treatment: 36 Active Problems ICD-10 Encounter Code Description Active Date MDM Diagnosis 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 (idiopathic) with ulcer and inflammation of left 12/06/2021 No Yes lower extremity L98.492 Non-pressure chronic ulcer of skin of other sites with fat layer exposed 08/11/2022 No Yes L97.518 Non-pressure chronic ulcer of other part of right foot with other specified 03/26/2023 No Yes severity 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 02/19/2023 No Yes foot 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, stage 3 10/07/2022 No Yes Inactive Problems ICD-10 Code Description Active Date Inactive Date L97.322 Non-pressure chronic ulcer of left ankle with fat layer exposed 12/06/2021 12/06/2021 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 Chad [name] Primary Care Provider: [name] Other Clinician: Referring Provider: Treating Provider/Extender: [name] Weeks in Treatment: 62 Subjective Chief Complaint Information obtained from Patient Left ankle ulcer, Left dorsal foot wound, right heel History of Present Illness (HPI) 10/08/18 on evaluation today patient actually presents to our office for initial evaluation concerning wounds that he has of the bilateral lower extremities. 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 He also has bilateral venous stasis. He apparently has been homeless currently following discharge from the hospital apparently he has been placed at [facility] which is is a skilled nursing facility locally. Nonetheless fortunately he does not show any signs of infection at this time which is good news. In fact several of the wound actually appears to be showing some signs of improvement already in my pinion. There are a couple areas in the left leg in particular there likely gonna require some sharp debridement to help clear away some necrotic tissue and help with more sufficient healing. No fevers, chills, nausea, or vomiting noted at this time. 10/15/18 on evaluation today patient actually appears to be doing very well in regard to his bilateral lower extremities. He's been tolerating the dressing changes without complication. Fortunately there does not appear to be any evidence of active infection at this time which is great news. Overall I'm actually very pleased with how this has progressed in just one visits time. Readmission: 08/14/2020 upon evaluation today patient presents for re-evaluation here in our clinic. He is having issues with his left ankle region  as well as his right toe and his right heel. He tells me that the toe and heel actually began as a area that was itching that he was scratching and then subsequently opened up into wounds. These may have been abscess areas I presume based on what I am seeing currently. With regard to his left ankle region he tells me this was a similar type occurrence although he does have venous stasis this very well may be more of a venous leg ulcer more than anything. Nonetheless I do believe that the patient would benefit from appropriate and aggressive wound care to try to help get things under better control here. He does have history of a stroke on the left side affecting him to some degree there that he is able to stand although he does have some residual weakness. Otherwise again the patient does have chronic venous insufficiency as previously noted. His arterial studies most recently obtained showed that he had an ABI on the right of 1.16 with a TBI of 0.52 and on the left and ABI of 1.14 with a TBI of 0.81. That was obtained on 06/19/2020. 08/28/2020 upon evaluation today patient appears to be doing decently well in regard to his wounds in general. He has been tolerating the dressing changes without complication. Fortunately there does not appear to be any signs of active infection which is great news. With that being said I think the [facility] is doing a good job I would recommend that we likely continue with that currently. 09/11/2020 upon evaluation today patient's wounds did not appear to be doing too poorly but again he is not really showing signs of significant improvement with regard to any of the wounds on the right. None of them have Hydrofera Blue on them I am not exactly sure why this is not being followed as the facility did not contact us to let us know of any issues with obtaining dressings or otherwise. With that being said he is supposed to be using Hydrofera Blue on both of the wounds on  the right foot as well as the ankle wound on the left side. 09/18/2020 upon evaluation today patient appears to be doing poorly with regard to his wounds. Again right now the left ankle  in particular showed signs of extreme maceration. Apparently he was told by someone with staff at [facility] they could not get the [facility]. With that being said this is something that is never been relayed to [place] one way or another. Also the patient subsequently has not supposed to have a border gauze dressing on. He should have an ABD pad and roll gauze to secure as this drains much too much just to have a border gauze dressing to cover. Nonetheless the fact that they are not using the appropriate dressing is directly causing deterioration of the left ankle wound it is significantly worse today compared to what it was previous. I did attempt to call [facility] while the patient was here I called three times and got no one to even pick up the phone. After this I had my for an office coordinator call and she was able to finally get through and leave a message with the D ON as of dictation of this note which is roughly about an hour and a half later I still have not been able to speak with anyone at the facility. 09/25/2020 upon evaluation today patient actually showing signs of good improvement which is excellent news. He has been tolerating the dressing changes without complication. Fortunately there is no signs of active infection which is great news. No fevers, chills, nausea, vomiting, or diarrhea. I do feel like the facility has been doing a much better job at taking care of him as far as the dressings are concerned. However the director of nursing never did call me back. 10/09/2020 upon evaluation today patient appears to be doing well with regard to his wound. The toe ulcer did require some debridement but the other 2 areas actually appear to be doing quite well. 10/19/2020 upon evaluation  today patient actually appears to be doing very well in regard to his wounds. In fact the heel does appear to be completely healed. The toe is doing better in the medial ankle on the left is also doing better. Overall I think he is headed in the right direction. EARMON, DUECK ([id number]) [id number] Page 11 of 17 10/26/2020 upon evaluation today patient appears to be doing well with regard to his wound. He is showing signs of improvement which is great news and overall I am very pleased with where things stand today. No fevers, chills, nausea, vomiting, or diarrhea. 11/02/2020 upon evaluation today patient appears to be doing well with regard to his wounds. He has been tolerating the dressing changes without complication overall I am extremely pleased with where things stand today. He in regard to the toe is almost completely healed and the medial ankle on the left is doing much better. 11/09/2020 upon evaluation today patient appears to be doing a little poorly in regard to his left medial ankle ulcer. Fortunately there does not appear to be any signs of systemic infection but unfortunately locally he does appear to be infected in fact he has blue-green drainage consistent with Pseudomonas. 11/16/2020 upon evaluation today patient appears to be doing well with regard to his wound. It actually appears to be doing better. I did place him on gentamicin cream since the Cipro was actually resistant even though he was positive for Pseudomonas on culture. Overall I think that he does seem to be doing better though I am unsure whether or not they have actually been putting the cream on. The patient is not sure that we did talk to the nurse directly  and she was going to initiate that treatment. Fortunately there does not appear to be any signs of active infection at this time. No fevers, chills, nausea, vomiting, or diarrhea. 4/28; the area on the right second toe is close to healed.  Left medial ankle required debridement 12/07/2020 upon evaluation today patient appears to be doing well with regard to his wounds. In fact the right second toe appears to be completely healed which is great news. Fortunately there does not appear to be any signs of active infection at this time which is also great news. I think we can probably discontinue the gentamicin on top of everything else. 12/14/2020 upon evaluation today patient appears to be doing well with regard to his wound. He is making good progress and overall very pleased with where things stand today. There is no signs of active infection at this time which is great news. 12/28/2020 upon evaluation today patient appears to be doing well with regard to his wounds. He has been tolerating the dressing changes without complication. Fortunately there is no signs of active infection at this time. No fevers, chills, nausea, vomiting, or diarrhea. 12/28/2020 upon evaluation today patient's wound bed actually showed signs of excellent improvement. He has great epithelization and granulation I do not see any signs of infection overall I am extremely pleased with where things stand at this point. No fevers, chills, nausea, vomiting, or diarrhea. 01/11/2021 upon evaluation today patient appears to be doing well with regard to his wound on his leg. He has been tolerating the dressing changes without complication. Fortunately there does not appear to be any signs of active infection which is great news. No fevers, chills, nausea, vomiting, or diarrhea. 01/25/2021 upon evaluation today patient appears to be doing well with regard to his wound. He has been tolerating the dressing changes without complication. Fortunately the collagen seems to be doing a great job which is excellent news. No fevers, chills, nausea, vomiting, or diarrhea. 02/08/2021 upon evaluation today patient's wound is actually looking a little bit worse especially in the periwound compared  to previous. Fortunately there does not appear to be any signs of infection which is great news with that being said he does have some irritation around the periphery of the wound which has me more concerned. He actually had a dressing on that had not been changed in 3 days. He also is supposed to have daily dressing changes. With regard to the dressing applied he had a silver alginate dressing and silver collagen is what is recommended and ordered. He also had no Desitin around the edges of the wound in the periwound region although that is on the order inspect to be done as well. In general I was very concerned I did contact [facility] actually spoke with Verlon Au who is the scheduling individual and subsequently she stated that she would pass the information to the D ON apparently the D ON was not available to talk to me when I call today. 02/18/2021 upon evaluation today patient's wound is actually showing signs of improvement. Fortunately there does not appear to be any evidence of infection which is great news overall I am extremely pleased with where things stand today. No fevers, chills, nausea, vomiting, or diarrhea. 8/3; patient presents for 1 week follow-up. He has no issues or complaints today. He denies signs of infection. 03/11/2021 upon evaluation today patient appears to be doing well with regard to his wound. He does have a little bit of slough noted  on the surface of the wound but fortunately there does not appear to be any signs of active infection at this time. No fevers, chills, nausea, vomiting, or diarrhea. 03/18/2021 upon evaluation today patient appears to be doing well with regard to his wound. He has been tolerating the dressing changes without complication. There was a little irritation more proximal to where the wound was that was not noted last week but nonetheless this is very superficial just seems to be more irritation we just need to make sure to put a good amount  of the zinc over the area in my opinion. Otherwise he does not seem to be doing significantly worse at all which is great news. 03/25/2021 upon evaluation today patient appears to be doing well with regard to his wound. He is going require some sharp debridement today to clear with some of the necrotic debris. I did perform this today without complication postdebridement wound bed appears to be doing much better this is great news. 04/08/2021 upon evaluation today patient appears to be doing decently well in regard to his wound although the overall measurement is not significantly smaller compared to previous. It is gone down a little bit but still the facility continues to not really put the appropriate dressings in place in fact he was supposed to have collagen we think he probably had more of an allergy to At this point. Fortunately there does not appear to be any signs of active infection systemically though locally I do not see anything on initial visualization either as far as erythema or warmth. 04/15/2021 upon evaluation today patient appears to be doing well with regard to his wound. He is actually showing signs of improvement. I did place him on antibiotics last week, Cipro. He has been taking that 2 times a day and seems to be tolerating it very well. I do not see any evidence of worsening and in fact the overall appearance of the wound is smaller today which is also great news. 9/26; left medial ankle chronic venous insufficiency wound is improved. Using Hydrofera Blue 10/10; left medial ankle chronic venous insufficiency. Wound has not changed much in appearance completely nonviable surface. Apparently there have been problems getting the right product on the wound at the facility although he came in with [facility] on today 05/14/2021 upon evaluation today patient appears to be doing well with regard to his wound. I think he is making progress here which is good news. Fortunately there  does not appear to be any signs of active infection at this time. No fevers, chills, nausea, vomiting, or diarrhea. 05/20/2021 upon evaluation today patient appears to be doing well with regard to his wound. He is showing signs of good improvement which is great news. There does not appear to be any evidence of active infection which is also excellent news. No fevers, chills, nausea, vomiting, or diarrhea. 05/28/2021 upon evaluation today patient appears to be doing quite well. There does not appear to be any signs of active infection at this time which is great news. Overall I am extremely pleased with where things stand today. I think he is headed in the right direction. 06/11/2021 upon evaluation today patient appears to be doing well with regard to his left ankle ulcer and poorly in regard to the toe ulcer on the second toe right foot. This appears to show signs of joint exposure. Apparently this has been present for 1 to 2 months although he kept forgetting to tell me about  it. That is unfortunate as right now it definitely appears to be doing significantly worse than what I would like to see. There does not appear to be any signs of active infection systemically though locally I am concerned about the possibility of infection the toe is quite red. Again no one from the facility ever contacted [place] to advise that this was going on in the interim either. 06/17/2021 upon evaluation today patient presents for follow-up I did review his x-ray which showed a navicular bone fracture I am unsure of the chronicity of this. Subsequently he also had osteomyelitis of the toe which was what I was more concerned about this did not show up on x-ray but did show up on the pathology scrapings. This was listed as acute osteomyelitis. Nonetheless at this point I think that the antibiotic treatment is the best regimen to go with [facility], [name] ([id number]) [id number] Page 12 of  17 currently. The patient is in agreement with that plan. Nonetheless he has initially 30 days of doxycycline off likely extend that towards the end of the treatment cycle that will be around the middle of December for an additional 2 weeks. That all depends on how well he continues to heal. Nonetheless based on what I am seeing in the foot I did want a proceed with an MRI as well which I think will be helpful to identify if there is anything else that needs to be addressed from the standpoint of infection. 06/24/2021 upon evaluation today patient appears to be doing pretty well in regards to his wounds. I think both are actually showing signs of improvement which is good I did review his MRI today which did show signs of osteomyelitis of the middle and proximal phalanx on his right foot of the affected toe. With that being said this is actually showing signs of significant improvement today already with the antibiotic therapy I think the redness is also improved. Overall I think that we just need to give this some time with appropriate wound care we will see how things go potentially hyperbarics could be considered. 07/02/2021 upon inspection today patient actually appears to be doing well in regard to his left ankle which is getting very close to complete resolution of pleased in that regard. Unfortunately he is continuing to have issues with his second toe right foot and this seems to still be very painful for him. Recommend he try something different from the standpoint of antibiotics. 07/15/2021 upon evaluation today patient appears to be doing actually pretty well in regard to his foot. This is actually showing signs of significant improvement which is great news. Overall I feel like the patient is improving both in regard to the second toe as well as the ankle on the left. With that being said the biggest issue that I do see currently is that he is needing to have a refill of the doxycycline that  we previously treated him with. He also did see podiatry they are not going to recommend any amputation at this point since he seems to be doing quite well. For that reason we just need to keep things under control from an infection standpoint. 08/01/2021 upon evaluation today patient appears to be doing well with regard to his wound. He has been tolerating the dressing changes without complication. Fortunately there does not appear to be any evidence of active infection locally nor systemically at this point. In fact I think everything is doing excellent in fact his second toe on  the right foot is almost healed and the ankle on the left ankle region is actually very close to being healed as well. 08/08/2021 upon evaluation today patient appears to be doing well with regard to his wound. He has been tolerating the dressing changes without complication. Fortunately I do not see any signs of active infection at this time. Readmission: 12-06-2021 upon evaluation today patient presents for reevaluation here in the clinic he does tell me that he was being seen in facility at [facility] by a provider that was coming in. He is not sure who this was. He tells me however that the wound seems to have gotten worse even compared to where it was when we last saw him at this point. With that being said I do believe that he is likely going need ongoing wound care here in the clinic and I do believe that we need to be the ones to frontline this since his wound does seem to be getting worse not better at this point. He voiced understanding. He is also in agreement with this plan and feels more comfortable coming here she tells me. Patient's medical history really has not changed since his prior admission he was only gone since January. 12-27-2021 upon evaluation today patient appears to be doing well with regard to his wound they did run out of the [facility] so they did not put anything on just an ABD pad  with gentamicin. Still we are seeing some signs of good improvement here with some new epithelization which is great news. 01-10-2022 upon evaluation today patient appears to be doing well with regard to his wounds and he is going require some sharp debridement but overall seems to be making good progress. Fortunately I do not see any evidence of active infection locally or systemically at this time which is great news. 01-24-2022 upon evaluation today patient appears to be doing well with regard to his wound. The facility actually came and dropped him off early and he had another appointment at the hospital and then they just brought him over here and this was still hours before his appointment this afternoon. For that reason we did do our best to work him in this morning and fortunately had some space to make this happen. With that being said patient's wound does seem to be making progress here and I am very pleased in that regard I do not see any signs of active infection locally or systemically at this time. 02-07-2022 patient appears to be doing well currently in regard to his wounds. In fact one of them the more proximal is healed the distal is still open but seems to be doing excellent. Fortunately I do not see any evidence of active infection locally or systemically at this time which is great news. No fevers, chills, nausea, vomiting, or diarrhea. 7/27; left medial ankle venous. Improving per our intake nurse. We are using Hydrofera Blue under Tubigrip compression. They are changing that at his facility 03-06-2022 upon evaluation today patient appears to be doing well with regard to the his wound he is can require some sharp debridement but seems to be making excellent progress. Fortunately I do not see any evidence of active infection locally or systemically which is great news. 03-27-2022 upon evaluation today patient's wound is actually showing signs of excellent improvement. Fortunately I see no  signs of active infection locally or systemically at this time which is great news. No fevers, chills, nausea, vomiting, or diarrhea. 04-21-2022  upon evaluation today patient appears to be doing excellent in regard to his wound in fact this is very close to resolution based on what I am seeing. I do not see any evidence of active infection locally or systemically at this time which is great news and overall I am extremely pleased with where we are today. 05-05-2022 upon evaluation today patient's wound actually appears to potentially be completely healed. Fortunately I do not see any evidence of active infection at this time which is great news and overall I am very pleased I think this needs a little time to toughen up but other than that I really do believe were doing quite well. He is very pleased to hear this its been a long time coming. 05-19-2022 upon evaluation today patient appears to be doing well currently in regard to his wound. He in fact we were hoping will be completely healed and closed but it still has a very tiny area right in the center which is still continuing to drain. Fortunately I do not see any signs of infection locally or systemically which is great news. 11/6; this wound is close to completely" he comes in this week with some erythema at 1 edge of this which looks like something was rubbing on the area. Other than that no evidence of infection 06-16-2022 upon evaluation today patient appears to be doing well currently in regard to his ankle ulcer. This is very close to being healed but still has a small area in the central portion which is not. Fortunately there does not appear to be any signs of infection locally or systemically which is great news. No fevers, chills, nausea, vomiting, or diarrhea. 06-30-2022 upon evaluation patient's wound pretty much appears to be almost completely closed. In fact it may even be closed but there are still a lot of skin irritation around  the edges of the wound and I just do not feel great about releasing them yet I would like to continue to monitor this just a little bit longer before getting to that point. He is not opposed to this and in fact is happy to continue to come in if need be in order to make sure that things are moving in the right direction he definitely does not want to backtrack. 07-14-2022 upon evaluation today patient appears to be doing well currently in regard to his wound. Has been tolerating the dressing changes without complication. Fortunately I see no evidence of active infection locally nor systemically which is great news. No fevers, chills, nausea, vomiting, or diarrhea. 08-11-2022 upon evaluation today patient appears to be doing a little worse than last time I saw him although it has been a month. Unfortunately he was not able to be seen due to the fact that he was quarantined in the facility with COVID. Subsequently he tells me that he mention to the facility staff several times about taking it off over the past several weeks but they continue to tell him that someone have to get in touch with [place] here although nobody ever did until Friday when the nurse manager called to have that documented in the communication notes. With that being said unfortunately this means that he had a wrap on that he was supposed to have on for only 1 week for ended up being on four 1 month essentially. I am glad that there is nothing worse than what we see KEONTAYE, BARBATO ([id number]) [id number] Page 13 of 17 currently to be perfectly honest. 08-19-2022  upon evaluation today patient appears to be doing well currently in regard to his wounds. He has been tolerating the dressing changes without complication. This is actually smaller today which is great news after last week's reopening. Fortunately I do not see any evidence of infection locally nor systemically at this point. 08-26-2022 upon evaluation  today patient appears to be doing well currently in regard to his wound. He has been tolerating the dressing changes without complication. Fortunately there does not appear to be any signs of active infection locally nor systemically which is great news. No fevers, chills, nausea, vomiting, or diarrhea. 09-02-2022 upon evaluation today patient's wound actually showing signs of excellent improvement this is measuring smaller and looking much better. I am very pleased with where we stand I do believe that we are headed in the right direction. 09-09-2022 upon evaluation today patient appears to be doing decently well in regard to his leg in general although there was some swelling this is the 1 thing that I am not too happy with based on what I see currently. Fortunately there does not appear to be any signs of infection locally nor systemically at this point. 09-16-2022 upon evaluation today patient appears to be doing well currently in regard to his wound. He is actually showing signs of improvement he wore the Tubigrip this week and it significantly better compared to last week's evaluation. Fortunately I do not see any evidence of active infection locally or systemically which is great news. Overall I think that were headed in the right direction which is great news. In fact overall I think this is the best that he has looked in some time. He does not like the Tubigrip but actually did extremely well with this in my opinion. I think he needs to continue to utilize this. 09-23-2022 upon evaluation today patient appears to be doing well currently in regard to his wound. He has been tolerating the dressing changes without complication. Fortunately there does not appear to be any signs of active infection locally nor systemically which is great news and in general I do feel like that we are headed in the right direction. He has been having trouble with the Tubigrip however we have gotten the facility to order  compression socks he has not gotten them as of yet. 3/4; patient is about the same with a wound on the left posterior leg. This is a venous wound. He needs more compression on this leg but he took off the previous wraps we are using. I am not certain whether he is using juxta lite stockings at the nursing home. 3/12; patient presents for follow-up. He unfortunately has 2 new wounds 1 to the dorsal left side and the other to the right heel. He is not sure how these happened. The right heel appears to be caused by pressure. He has been using Hydrofera Blue to the original wound on the left leg. 10-14-2022 upon evaluation today patient appears to be doing well currently in regard to his original wound but unfortunately he has several new wounds which I was completely unaware of before walking into the room to see him today. Unfortunately he seems to not be doing nearly as well as what I saw when I last had an appointment with him 3 weeks ago. Fortunately I do not see any signs of systemic infection though unfortunately locally he has definite infection of the left lower extremity. He is currently on doxycycline I am going to likely have to  extend that today. 10-21-2022 upon evaluation today patient appears to be doing well currently in regard to his wounds. He seems to be tolerating the dressing changes without complication. Fortunately there does not appear to be any signs of active infection locally or systemically which is great news and in general I definitely feel like we are on the right track here. 10-30-2022 upon evaluation today patient still continues to not be doing too well at all with regard to his wounds. Unfortunately he is still having signs of infection as well which also has been concerned. Fortunately I do not see any signs of active infection locally nor systemically at this time. No fevers, chills, nausea, vomiting, or diarrhea. 11-06-2022 upon evaluation today patient appears to be  doing okay currently in regard to his wounds. All things considered with his blood flow not being nearly as good as what should I think that he is making pretty good progress here. Fortunately I do not see any signs of infection locally nor systemically which is great news. No fevers, chills, nausea, vomiting, or diarrhea. 11-13-2022 upon evaluation today patient appears to be doing poorly currently in regard to his wounds. Things seem to be getting a little bit worse not better. 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 Subsequently he is also going to need to have vascular intervention but we are unsure exactly when this is going on the undertaking. I would try to actually send a message to Dr. Hazle Quant and the vascular team just to see what exactly the plan is going forward. The patient is in agreement with that plan I just think we need to have a plan especially in light of the fact the left leg seems to be getting a little bit worse at this point. 11-25-2022 upon evaluation today patient is currently now discharged from the hospital following his colostomy procedure. That did very well and he seems to be doing well. Fortunately there does not appear to be any signs of active infection locally or systemically which is great news. No fevers, chills, nausea, vomiting, or diarrhea. 12-04-2022 upon evaluation today patient appears to be doing actually decently well in regard to his wounds and actually very pleased with where we stand and I think that he is making some pretty good progress here. Fortunately I do not see any signs of active infection locally nor systemically which is great news. No fevers, chills, nausea, vomiting, or diarrhea. 12-12-2022 upon evaluation today patient appears to be doing well currently in regard to his wound. He has been tolerating the dressing changes without complication. The  wounds all seem to be making some progress to a degree. He has appointment with vascular on the 23rd of this month. 5/24; the patient was scheduled for a left lower extremity angiogram yesterday but apparently they were overbooked and they wanted to admit him to hospital last night so they could do this early this morning but he refused I see that Dr. Wyn Quaker has rescheduled him for June 6. He is using silver alginate to wounds on his bilateral lower extremities. 01-02-2023 upon evaluation today patient appears to be doing poorly in regard to his wounds still. Subsequently he did have his angiogram but apparently there really was not much that was found that could be revascularized. The patient tells me that Dr. Wyn Quaker was going to contact me here in the office I  have not heard from him as of yet and I may see about giving him a call just to follow-up or possibly send a message through epic which could be an option as well. 01-09-2023 upon evaluation today patient appears to be doing poorly in regard to his legs he is extremely swollen. He has been sleeping in his chair he tells me for the past week. His bed is broken and they have ordered a new one which is supposedly supposed to be there today. With that being said he the way he needs to be not sleeping in his chair that is his wheelchair every day and through the night. This is causing his legs to be much more swollen and it seen and how badly wounds are doing at this point. This has made things significantly worse. Fortunately I do not see any signs of infection right now unfortunately I do believe that the patient has a tremendous amount of drainage currently. 01-19-2023 upon evaluation today patient appears to be doing poorly in regard to his wounds of his legs his swelling is better but still not as good as what we would like to see. Fortunately I do not see any evidence of active infection locally nor systemically which is great news and in general I do  believe that we are moving in the right direction here. Nonetheless it would be better if she was in the bed sleeping or not send up in his wheelchair although he seems to still be set up in the wheelchair at this time. He tells me that he is just not as comfortable in the bed as the wheelchair but unfortunately this is causing damage to his legs which I discussed with him today as well. Nonetheless I am not sure that he is going to be amendable to doing anything different. 01-27-2023 upon evaluation today patient appears to be doing well currently. Fortunately I do not see any evidence of active infection locally nor systemically which is great news I think he is actually doing the best option and while in fact I would try to see if we can do some debridement today to get things moving along. TREA, KENWORTHY ([id number]) [id number] Page 14 of 17 02-03-2023 upon evaluation today patient's wounds unfortunately are not doing nearly as well. Will be doing the [employer] wraps unfortunately we just not seeing the improvement that I would like to see. Fortunately I do not see any evidence of infection at this time which is good news unfortunately I do think that he needs to be changed on a daily basis due to the amount of drainage otherwise I do not think this is really going to continue to improve dramatically. 02-12-2023 upon evaluation today patient appears to be doing better in a lot of ways in regard to his wounds and although his toes on the left side actually seem a bit worse. Fortunately I do not see any evidence of active infection locally nor systemically at this time which is good news. With that being said he is having some bleeding from the left leg I think that this is an area that looks like it probably got bumped. With that being said I discussed this with the patient he tells me he does not know of any trauma that occurred but at the same time if was not this that it  was skin that got pulled off. Dressing change there is only one of the 2 that could have happened. 7/25; this is a  patient who is very frail at [facility] nursing facility. He is nonambulatory. He has wounds on his bilateral lower legs. However much worse on the left we have been using silver cell to all of his wounds He comes in today and a lot more pain our intake nurse reports deterioration of the left foot. I ended up looking through some of his record. 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 I am really not certain of the stage of this currently or who is following 02-26-2023 upon evaluation today patient unfortunately appears to be doing significantly worse compared to where he was previous. The left foot is showing signs of dramatic worsening in regard to the overall appearance his toes are starting to become necrotic almost in full and he has much more cyanosis present than what I even seen previous. He is already seeing Dr. Wyn Quaker and unfortunately there was not anything that they saw that was an immediate cause for this but at the same time I feel like this may have changed and even worsened I feel like this could be a vascular event and I believe this is a limb-threatening situation. 03/26/2023; I last saw this patient on 8/1. Wondered about below-knee amputations versus palliative care. 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 The wound has healed fairly well and he follows up with surgery  today. He is back today looking at wounds on the right foot and lower leg. This includes 2 areas on the right heel, right dorsal foot right dorsal first and second toes and a small area on the right anterior lower tibia area. He has a lot of pain in this leg as well. The angiogram he had on 01/01/2023 specific to the right leg now demonstrated a fairly normal common femoral artery, profunda femoris artery and superficial and popliteal arteries without significant disease. 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 No doubt this has a lot to do with the discomfort in the right leg he describes when he is in bed. They have been using Xeroform on the wounds on the right. He has a lot of swelling in this leg with weeping edema 04-09-2023 upon evaluation today patient appears to be doing poorly still in regard to his right leg. 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 Fortunately I do not see any signs of active infection locally or systemically at this time which is great news. No fevers, chills, nausea, vomiting, or diarrhea. With that being said I am concerned still about the fact that he seems to be getting worse especially in regard to the heels I think that he is still sitting up most of the day which I think is still causing some swelling of his leg unfortunately. 04-16-2023 upon evaluation today patient actually appears to be doing little bit more poorly compared to last week this actually does appear to be infected. Last week  I really did not think that was the case but this seems like it may indeed be the case. Fortunately I do not see any signs of active infection locally or systemically at this time. 04-24-2023 upon evaluation today patient appears to be doing well currently in regard to his leg compared to where we were previous I  do feel like that the antibiotics have been of benefit. Fortunately I do not see any signs of active infection at this time which is great news and in general I believe that we are making headway towards healing which is great news as well. With that being said he still has a lot of drainage I really feel like something like Anola Gurney would be better for him than just an ABD pad it would likely the drainage more or even Zetuvit's but I am not sure whether we can get either 1 of these at the facility for him. 05-01-2023 upon evaluation today patient appears to be doing well currently in regard to his wounds which are showing signs of significant improvement is not any way shape or form infected like it was last time I saw him. I think the antibiotics are doing a good job but I think he may need an extension here to ensure that it completely clears. 05-08-2023 upon evaluation today patient appears to be doing well currently in regard to his wounds for the most part. He still has blue-green drainage noted but in general seems to be doing much better compared to where we were previous. Fortunately I do not see any signs of active infection locally or systemically which is great news and in general I do believe that we are making good headway towards complete closure. 05-18-2023 upon evaluation today patient appears to be doing well currently in regard to his wounds I feel like he is definitely making some improvements here. This is a slow improvement but nonetheless improvement. I do believe that he is doing quite well as far as infection is concerned which is also good news. 05-25-2023 upon evaluation today patient actually appears to be doing worse in regard to his legs at this point. Unfortunately he continues to have issues with significant drainage which is the primary concern here. I do feel like that the wound currently is not doing a whole lot better when he was on the Levaquin I felt like it was  improving some but the concern there is QT prolongation I do not want to continue that personally further I think he needs a referral ASAP to infectious disease. I discussed that with him today. He also needs to be having this changed every day and that has not been happening at this point. That is my biggest concern here. 06-09-2023 upon evaluation today patient appears to be doing still poorly in regard to this right leg which is draining profusely. This is unfortunately just continuing to show signs of being very moist and wet and weeping quite significantly. This is despite everything that we are doing including the facility have been placed him on Levaquin by ID which unfortunately just does not seem to be helping to heal this as well as we would hope either. I do have an infectious disease appointment that we sent 10 for him and that is actually scheduled for next week on the 19th. 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 06-17-2023 upon evaluation  today patient appears to be doing still poorly in regard to his legs at this point. Unfortunately he still seems to be having issues with significant drainage we are having this changed every single day. With that being said despite this the patient just does not seem to be making a lot of headway here towards closure. We are trying to do what we can from the standpoint of compression as well. He did see infectious disease yesterday. I did review that note as well. Upon evaluation the note actually states that the patient told the physician "do I have to keep suffering with wounds forevero Is there a way to get rid of these woundso However he did not want a referral to see Dr. Lajoyce Corners for potential amputation which seems to be 1 option that was proposed. With that being said he also apparently told her that he was "adamant about not doing another course  of antibiotics as he feels like they have not healed his wounds". It appears based on evaluation with regard to the record that the patient did have a discussion with her about the possibility of doing a short course of IV cefepime for 14 days to cover Serratia as well as the other bacteria noted on culture. However the patient declined this as he felt like the antibiotics in the past he had taken did not help at all and there were no signs and symptoms to suggest sepsis. Therefore she recommended continuing wound care and to continue to follow-up with vascular follow-up in 2 weeks with her. I appreciate this evaluation and again I cannot do anything with the fact that the patient did not want to proceed with any further treatment with regard to the bacterial infection. ROCK, RIZK ([id number]) [id number] Page 15 of 17 06-23-2023 the patient seems to making some progress here in regard to his wound. I feel like it is still a tremendous amount of drainage but at the same time I feel like you are also showing some signs of significant improvement which is great news and very pleased with where things stand currently. Especially compared to where we have been. 07-07-2023 upon evaluation today patient appears to be doing about the same currently in regard to his wounds. He does not seem quite as erythematous as he has been around the wounds but the wounds continue to get larger due to the swelling and the drainage. Again I think the moisture is causing additional and further breakdown which is unfortunate. With that being said I discussed with the patient that I do believe that the only way to get this better is going to be to keep the swelling under control and the drainage off of his skin. This is the hardest part to be sure. I think the alginate dressing is being used at the facility is actually keeping things more moist which is not ideal. 07-15-2023 upon evaluation  today patient appears to be doing poorly still in regard to his wounds he still continues to have significant issues. Fortunately I do not see any signs of active infection which is great news and in general I am pleased in that regard but at the same time I do believe that he is not doing quite as well as he could be especially in light of the fact that he still did put the alginate on which is gets very wet and stays completely soaked on the wounds on trying to get this off and just use the XtraSorb which would think  be much better. Objective Constitutional Well-nourished and well-hydrated in no acute distress. Vitals Time Taken: 10:42 AM, Height: 69 in, Weight: 150 lbs, BMI: 22.1, Temperature: 98.0 F, Pulse: 97 bpm, Respiratory Rate: 16 breaths/min, Blood Pressure: 94/63 mmHg. Respiratory normal breathing without difficulty. Psychiatric this patient is able to make decisions and demonstrates good insight into disease process. Alert and Oriented x 3. pleasant and cooperative. General Notes: Unfortunately patient's wounds are not doing as well as I would like to see him try to get the drainage to stay off of the skin which I think is the primary concern here to be honest. We are going tried to reinforce that we do not want the alginate to be on the wounds just having the Zetuvit only in order to try to prevent this from staying so wet he is in agreement with this plan and we will see how things go. Integumentary (Hair, Skin) Wound #16 status is Open. Original cause of wound was Gradually Appeared. The date acquired was: 09/26/2022. The wound has been in treatment 40 weeks. The wound is located on the Right Calcaneus. The wound measures 10cm length x 11.5cm width x 0.3cm depth; 90.321cm^2 area and 27.096cm^3 volume. There is a medium amount of serosanguineous drainage noted. Wound #18 status is Open. Original cause of wound was Pressure Injury. The date acquired was: 09/26/2022. The wound has been in  treatment 39 weeks. The wound is located on the Right,Medial T Second. The wound measures 3.2cm length x 1.3cm width x 0.1cm depth; 3.267cm^2 area and 0.327cm^3 volume. oe There is a medium amount of serosanguineous drainage noted. Wound #21 status is Open. Original cause of wound was Pressure Injury. The date acquired was: 03/02/2023. The wound has been in treatment 15 weeks. The wound is located on the Right,Dorsal Foot. The wound measures 5cm length x 4.5cm width x 0.2cm depth; 17.671cm^2 area and 3.534cm^3 volume. There is a medium amount of serous drainage noted. Wound #22 status is Open. Original cause of wound was Pressure Injury. The date acquired was: 03/02/2023. The wound has been in treatment 15 weeks. The wound is located on the Right,Anterior Lower Leg. The wound measures 17cm length x 12cm width x 0.2cm depth; 160.221cm^2 area and 32.044cm^3 volume. There is a medium amount of serous drainage noted. Wound #23 status is Open. Original cause of wound was Pressure Injury. The date acquired was: 05/02/2023. The wound has been in treatment 9 weeks. The wound is located on the Right,Posterior Lower Leg. The wound measures 13cm length x 13cm width x 0.2cm depth; 132.732cm^2 area and 26.546cm^3 volume. There is a medium amount of serosanguineous drainage noted. 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 Nurse Visit as needed RAYAN, MONTIE ([id number]) [id number] Page 16 of 17 Bathing/ Shower/ Hygiene: Wash wounds with antibacterial soap and  water. - LEAVE OFF THE CALCIUM ALGINATE BECAUSE OF SO MUCH DRAINAGE. XTRASORB ONLY EXCEPT, PLACE HYDROFERA BLUE STRIPS BETWEEN THE TOES. CHANGE DAILY No tub bath. Anesthetic (Use 'Patient Medications' Section for Anesthetic Order Entry): Lidocaine applied to wound bed Off-Loading: Heel suspension boot - when in bed Turn and reposition every 2 hours WOUND #  16: - Calcaneus Wound Laterality: Right Cleanser: Soap and Water 1 x Per Day/30 Days Discharge Instructions: Gently cleanse wound with antibacterial soap, rinse and pat dry prior to dressing wounds Cleanser: Wound Cleanser 1 x Per Day/30 Days Discharge Instructions: Wash your hands with soap and water. Remove old dressing, discard into plastic bag and place into trash. Cleanse the wound with Wound Cleanser prior to applying a clean dressing using gauze sponges, not tissues or cotton balls. Do not scrub or use excessive force. Pat dry using gauze sponges, not tissue or cotton balls. Secondary Dressing: Xtrasorb Large 6x9 (in/in) 1 x Per Day/30 Days Discharge Instructions: Apply to wound as directed. Do not cut. Secondary Dressing: Heel Cup 1 x Per Day/30 Days Secured With: Kerlix Roll Sterile or Non-Sterile 6-ply 4.5x4 (yd/yd) 1 x Per Day/30 Days Discharge Instructions: Apply Kerlix as directed Secured With: Tubigrip Size D, 3x10 (in/yd) 1 x Per Day/30 Days WOUND #18: - T Second Wound Laterality: Right, Medial oe Cleanser: Soap and Water 1 x Per Day/30 Days Discharge Instructions: Gently cleanse wound with antibacterial soap, rinse and pat dry prior to dressing wounds Cleanser: Wound Cleanser 1 x Per Day/30 Days Discharge Instructions: Wash your hands with soap and water. Remove old dressing, discard into plastic bag and place into trash. Cleanse the wound with Wound Cleanser prior to applying a clean dressing using gauze sponges, not tissues or cotton balls. Do not scrub or use excessive force. Pat dry using gauze sponges, not tissue or  cotton balls. Prim Dressing: Hydrofera Blue Ready Transfer Foam, 2.5x2.5 (in/in) 1 x Per Day/30 Days ary Discharge Instructions: Apply Hydrofera Blue Ready to wound bed as directed Secondary Dressing: Xtrasorb Large 6x9 (in/in) 1 x Per Day/30 Days Discharge Instructions: Apply to wound as directed. Do not cut. Secured With: [employer] or Non-Sterile 6-ply 4.5x4 (yd/yd) 1 x Per Day/30 Days Discharge Instructions: Apply Kerlix as directed Secured With: Tubigrip Size D, 3x10 (in/yd) 1 x Per Day/30 Days WOUND #21: - Foot Wound Laterality: Dorsal, Right Cleanser: Soap and Water 1 x Per Day/30 Days Discharge Instructions: Gently cleanse wound with antibacterial soap, rinse and pat dry prior to dressing wounds Cleanser: Wound Cleanser 1 x Per Day/30 Days Discharge Instructions: Wash your hands with soap and water. Remove old dressing, discard into plastic bag and place into trash. Cleanse the wound with Wound Cleanser prior to applying a clean dressing using gauze sponges, not tissues or cotton balls. Do not scrub or use excessive force. Pat dry using gauze sponges, not tissue or cotton balls. Secondary Dressing: Xtrasorb Large 6x9 (in/in) 1 x Per Day/30 Days Discharge Instructions: Apply to wound as directed. Do not cut. Secured With: [employer] or Non-Sterile 6-ply 4.5x4 (yd/yd) 1 x Per Day/30 Days Discharge Instructions: Apply Kerlix as directed Secured With: Tubigrip Size D, 3x10 (in/yd) 1 x Per Day/30 Days WOUND #22: - Lower Leg Wound Laterality: Right, Anterior Cleanser: Soap and Water 1 x Per Day/30 Days Discharge Instructions: Gently cleanse wound with antibacterial soap, rinse and pat dry prior to dressing wounds Cleanser: Wound Cleanser 1 x Per Day/30 Days Discharge Instructions: Wash your hands with soap and water. Remove old dressing, discard into plastic bag and place into trash. Cleanse the wound with Wound Cleanser prior to applying a clean dressing using gauze  sponges, not tissues or cotton balls. Do not scrub or use excessive force. Pat dry using gauze sponges, not tissue or cotton balls. Secondary Dressing: Xtrasorb Large 6x9 (in/in) 1 x  Per Day/30 Days Discharge Instructions: Apply to wound as directed. Do not cut. Secured With: [employer] or Non-Sterile 6-ply 4.5x4 (yd/yd) 1 x Per Day/30 Days Discharge Instructions: Apply Kerlix as directed Secured With: Tubigrip Size D, 3x10 (in/yd) 1 x Per Day/30 Days WOUND #23: - Lower Leg Wound Laterality: Right, Posterior Cleanser: Soap and Water 1 x Per Day/30 Days Discharge Instructions: Gently cleanse wound with antibacterial soap, rinse and pat dry prior to dressing wounds Cleanser: Wound Cleanser 1 x Per Day/30 Days Discharge Instructions: Wash your hands with soap and water. Remove old dressing, discard into plastic bag and place into trash. Cleanse the wound with Wound Cleanser prior to applying a clean dressing using gauze sponges, not tissues or cotton balls. Do not scrub or use excessive force. Pat dry using gauze sponges, not tissue or cotton balls. Secondary Dressing: Xtrasorb Large 6x9 (in/in) 1 x Per Day/30 Days Discharge Instructions: Apply to wound as directed. Do not cut. Secured With: [employer] or Non-Sterile 6-ply 4.5x4 (yd/yd) 1 x Per Day/30 Days Discharge Instructions: Apply Kerlix as directed Secured With: Tubigrip Size D, 3x10 (in/yd) 1 x Per Day/30 Days 1. I would recommend that we have the patient go ahead and continue to elevate his legs much as possible he needs to be doing this is much as he can. 2. Also can recommend the patient should continue to monitor for any signs of infection or worsening if anything changes he knows to contact the office and let me know. Otherwise working to continue with Anola Gurney currently and again he has seen infectious disease but they did not see anything that felt required any additional antibiotics at this point. We will see  patient back for reevaluation in 1 week here in the clinic. If anything worsens or changes patient will contact our office for additional recommendations. Electronic Signature(s) Signed: 07/15/2023 5:04:19 PM By: [name] PA-C Entered By: [name] on 07/15/2023 17:04:19 [name] ([id number]) [id number] Page 17 of 17 -------------------------------------------------------------------------------- SuperBill Details Patient Name: Date of Service: DAMAREE, WINTJEN 07/14/2023 Medical Record Number: [id number] Patient Account Number: [id number] Date of Birth/Sex: Treating RN: 05-28-1961 (62 y.o. Chad [name] Primary Care Provider: [name] Other Clinician: Referring Provider: Treating Provider/Extender: [name] Weeks in Treatment: 83 Diagnosis Coding ICD-10 Codes Code Description [phone] Chronic venous hypertension (idiopathic) with ulcer and inflammation of left lower extremity 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, stage 3 Facility Procedures : CPT4 Code: [id number] Description: 37628 - WOUND CARE VISIT-LEV 5 EST PT Modifier: Quantity: 1 Physician Procedures : CPT4 Code Description Modifier [id number] - WC PHYS LEVEL 3 - EST PT ICD-10 Diagnosis Description 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 (idiopathic) with ulcer and inflammation of left lower extremity L98.492 Non-pressure chronic ulcer of skin of  other sites with fat layer exposed 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 Quantity: 1 Electronic  Signature(s) Signed: 07/15/2023 5:04:32 PM By: [name] PA-C Previous Signature: 07/14/2023 11:57:35 AM Version By: Midge Aver MSN RN CNS WTA Entered By: [name] on 07/15/2023 17:04:31

## 2023-07-14 NOTE — Progress Notes (Signed)
Specialty Pharmacy Initial Fill Coordination Note  Chad [name] is a 62 y.o. male contacted today regarding initial fill of specialty medication(s) Sofosbuvir-Velpatasvir   Patient requested Delivery   Delivery date: 07/16/23   Verified address: [address] [place] [address]   Medication will be filled on 07/15/23.   Patient is aware of 4.00 copayment.  Put on AR/ACCT

## 2023-07-14 NOTE — Progress Notes (Addendum)
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   Genetic testing 06/15/2019   Swelling of limb 04/15/2019   History of colon cancer 03/24/2019   Family history of prostate cancer    Family history of colon cancer    Family history of leukemia    Family history of kidney cancer    Goals of care, counseling/discussion 12/25/2018   Chronic pain syndrome 11/18/2018   Peripheral vascular disease of lower extremity with ulceration (HCC) 10/04/2018   Neoplasm related pain 09/08/2018   Poor appetite 09/08/2018   Weakness generalized 07/02/2018   Anorexia 07/02/2018   Chronic pain due to neoplasm 07/02/2018   Iron deficiency anemia due to chronic blood loss 05/27/2018   Ureteral cancer, 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  Previous Medications   ACETAMINOPHEN (TYLENOL) 325 MG TABLET    Take 650 mg by mouth 3 (three) times daily.    AMINO ACIDS-PROTEIN HYDROLYS (PRO-STAT) LIQD    Take 45 mLs by mouth daily.   ASCORBIC ACID (VITAMIN C) 500 MG TABLET    Take 1  tablet (500 mg total) by mouth 2 (two) times daily.   ASPIRIN EC 81 MG TABLET    Take 1 tablet (81 mg total) by mouth daily.   ATORVASTATIN (LIPITOR) 10 MG TABLET    Take 1 tablet (10 mg total) by mouth daily.   CALCIUM CARB-CHOLECALCIFEROL (CALCIUM CARBONATE-VITAMIN D3 PO)    Take 2 tablets by mouth daily. 600-400 mg   CARBOXYMETHYLCELLULOSE SODIUM (REFRESH LIQUIGEL) 1 % GEL    1 gtt OS only daily   CLOPIDOGREL (PLAVIX) 75 MG TABLET    Take 75 mg by mouth daily.   CYCLOBENZAPRINE (FEXMID) 7.5 MG TABLET    Take 15 mg by mouth at bedtime.   ESCITALOPRAM (LEXAPRO) 5 MG TABLET    Take 5 mg by mouth daily.   FERROUS FUMARATE (FERRETTS) 325 (106 FE) MG TABS TABLET    Take 1 tablet by mouth.   GABAPENTIN (NEURONTIN) 100 MG CAPSULE    Take 100 mg by mouth 3 (three) times daily.   IBUPROFEN (ADVIL) 400 MG TABLET    Take 1 tablet (400 mg total) by mouth every 6 (six) hours as needed for mild pain or moderate pain.   LEPTOSPERMUM MANUKA HONEY (MEDIHONEY) PSTE PASTE    Apply 1 Application topically daily.   MIRTAZAPINE (REMERON) 7.5 MG TABLET    Take 7.5 mg by mouth at bedtime.   MULTIPLE VITAMIN (MULTIVITAMIN) TABLET    Take 1 tablet by mouth daily.  OXYCODONE 10 MG TABS    Take 1-1.5 tablets (10-15 mg total) by mouth every 4 (four) hours as needed for severe pain or moderate pain.   POLYETHYL GLYCOL-PROPYL GLYCOL (SYSTANE) 0.4-0.3 % SOLN    Apply to eye.   POTASSIUM CHLORIDE SA (KLOR-CON M) 20 MEQ TABLET    Take 2 tablets (40 mEq total) by mouth daily.   SENNA-DOCUSATE (SENOKOT-S) 8.6-50 MG TABLET    Take 1 tablet by mouth daily.   TAMSULOSIN (FLOMAX) 0.4 MG CAPS CAPSULE    Take 1 capsule (0.4 mg total) by mouth daily after supper.   TRAMADOL (ULTRAM) 50 MG TABLET    Take 1 tablet (50 mg total) by mouth every 6 (six) hours as needed.  Modified Medications   No medications on file  Discontinued Medications   No medications on file    Subjective: 62 Y O male with h/o Urothelial ca, colon ca s/p rt  hemicolectomy, COPD, HCV, BPH, anxiety/depression, HLD, malnutrition, CVA s/p left sided weakness, PVD, s/p left BKA, chronic venous insufficiency,  DU of rt leg and foot who is referred from wound care for concerns for infection of wounds in the Rt LE. Patient has been following with wound care for rt calcaneus, rt dorsal foot wound, rt posterior leg, rt anterior leg, rt medial second toe wounds for last several months.   Reports he had chronic wounds in the rt leg and rt foot for several months. He is very frustrated about noone  being unable to do something to heal the wound. Describes yellowish intermittent drainage from the wound but cannot tell me if its foul smelling. Reports completing recently 2 weeks course of PO levofloxacin and he is adamant about not doing another course of abtx as he feels antibiotics have not healed his wounds. He told me " Do I have to keep on suffering from this wounds forever? Is there a way to get rid of these wound ? " However, he also declined seeing Orthopedics Dr Lajoyce Corners to see if he needs a surgical intervention. I attempted to explain him that he was referred to me for evaluation of possible need for IV abtx due to non healing of the wound despite of po abtx. Denies fevers, chills. Denies nausea, vomiting, diarrhea or any symptoms concerning for wound infection   He has been following with wound care, last seen 11/12 when they were concerned for infection being present despite completing course of levofloxacin. He was last seen by Vascular 10/16 for left BKA amputation site where wound was healing except a very small 1cm area of opening, plan to fu in 4 weeks.   Regarding HCV, he says everything is good and he is here for his Rt leg and nothing else. He was not willing to discuss about HCV.   12/4 He reports he is aware about his HCV but has never been treated for it. 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 Reports his ex wife also  had HCV. Denies h/o blood transfusion,  family h/o liver disease, liver cancer.   Denies any changes to his rt leg and foot after last visit. Last seen by wound care Dr Leonard Schwartz 11/26 and reviewed notes which mentioned significant signs of improvement although tremendous amount of drainage present. Denies fevers, chills. Denies nausea, vomiting or diarrhea. I reviewed MAR and he is not on antibiotics.   12/17 Here for HCV fu. Discussed last lab results as well as US  abdomen findings. Plan to start Epclusa * 12 weeks. Last seen by wound care 12/10 with no significant changes in the wound although wound continues to be larger due to swelling and drainage. Denies fevers, chills, nausea, vomiting or other concerns. He is willing to start tx for HCV.   Review of Systems: as above  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    GERD (gastroesophageal reflux disease)    Hepatitis    chronic hep c   Hydronephrosis    Hydronephrosis with ureteral stricture    Hyperlipidemia    Insomnia, unspecified    Knee pain    Left   Major depressive disorder, recurrent, moderate (HCC)    Malignant neoplasm of colon (HCC)    Malnutrition (HCC)    Muscle weakness (generalized)    Nerve pain    Other abnormalities of gait and mobility    Other chronic pain    Peripheral vascular disease (HCC)    Personal  history of transient cerebral ischemia    Pressure ulcer    Prostate cancer (HCC)    Stroke (HCC)    Tinea unguium    Tobacco user    Unspecified protein-calorie malnutrition (HCC)    Ureteral cancer, right (HCC)    Urinary frequency    Venous hypertension of both lower extremities    Xerosis cutis    Past Surgical History:  Procedure Laterality Date   AMPUTATION Left 03/03/2023   Procedure: AMPUTATION BELOW KNEE;  Surgeon: Renford Dills, MD;  Location: [facility];  Service: Vascular;  Laterality: Left;   COLON SURGERY     En bloc extended right hemicolectomy 07/2017   COLONOSCOPY WITH PROPOFOL N/A 11/06/2020   Procedure: COLONOSCOPY WITH PROPOFOL;  Surgeon: Wyline Mood, MD;  Location: [facility];  Service: Gastroenterology;  Laterality: N/A;   COLONOSCOPY WITH PROPOFOL N/A 07/31/2021   Procedure: COLONOSCOPY WITH PROPOFOL;  Surgeon: Earline Mayotte, MD;  Location: [facility] ENDOSCOPY;  Service: Endoscopy;  Laterality: N/A;   CYSTOSCOPY W/ RETROGRADES Right 08/30/2018   Procedure: CYSTOSCOPY WITH RETROGRADE PYELOGRAM;  Surgeon: Vanna Scotland, MD;  Location: [facility];  Service: Urology;  Laterality: Right;   CYSTOSCOPY WITH STENT PLACEMENT Right 04/25/2018   Procedure: CYSTOSCOPY WITH STENT PLACEMENT;  Surgeon: Vanna Scotland, MD;  Location: [facility];  Service: Urology;  Laterality: Right;   CYSTOSCOPY WITH STENT PLACEMENT Right 08/30/2018   Procedure: CYSTOSCOPY WITH STENT Exchange;  Surgeon: Vanna Scotland, MD;  Location: [facility];  Service: Urology;  Laterality: Right;   CYSTOSCOPY WITH STENT PLACEMENT Right 03/07/2019   Procedure: CYSTOSCOPY WITH STENT Exchange;  Surgeon: Vanna Scotland, MD;  Location: [facility];  Service: Urology;  Laterality: Right;   CYSTOSCOPY WITH STENT PLACEMENT Right 11/21/2019   Procedure: CYSTOSCOPY WITH STENT Exchange;  Surgeon:  Vanna Scotland, MD;  Location: [facility];  Service: Urology;  Laterality: Right;   LOWER EXTREMITY ANGIOGRAPHY Left  05/23/2019   Procedure: LOWER EXTREMITY ANGIOGRAPHY;  Surgeon: Annice Needy, MD;  Location: [facility];  Service: Cardiovascular;  Laterality: Left;   LOWER EXTREMITY ANGIOGRAPHY Right 05/30/2019   Procedure: LOWER EXTREMITY ANGIOGRAPHY;  Surgeon: Annice Needy, MD;  Location: [facility];  Service: Cardiovascular;  Laterality: Right;   LOWER EXTREMITY ANGIOGRAPHY Right 02/13/2020   Procedure: LOWER EXTREMITY ANGIOGRAPHY;  Surgeon: Annice Needy, MD;  Location: [facility];  Service: Cardiovascular;  Laterality: Right;   LOWER EXTREMITY ANGIOGRAPHY Left 02/20/2020   Procedure: LOWER EXTREMITY ANGIOGRAPHY;  Surgeon: Annice Needy, MD;  Location: [facility];  Service: Cardiovascular;  Laterality: Left;   LOWER EXTREMITY ANGIOGRAPHY Left 01/01/2023   Procedure: Lower Extremity Angiography;  Surgeon: Annice Needy, MD;  Location: [facility];  Service: Cardiovascular;  Laterality: Left;   PORTA CATH INSERTION N/A 02/28/2019   Procedure: PORTA CATH INSERTION;  Surgeon: Annice Needy, MD;  Location: [facility];  Service: Cardiovascular;  Laterality: N/A;   ROBOT ASSISTED LAPAROSCOPIC PARTIAL COLECTOMY  11/17/2022   tumor removed       Social History   Tobacco Use   Smoking status: Every Day    Current packs/day: 0.50    Types: Cigarettes    Passive exposure: Never   Smokeless tobacco: Never  Vaping Use   Vaping status: Never Used  Substance Use Topics   Alcohol use: Not Currently   Drug use: Not Currently    Types: Marijuana, Methylphenidate    Comment: quit 1997-1998 ish    Family History  Problem Relation Age of Onset   Prostate cancer Father 54   Cancer Brother 47       unsure type   Cancer Paternal Uncle        unsure type   Cancer Maternal Grandmother        unsure type   Cancer Paternal Grandmother        unsure type   Kidney cancer Paternal Grandfather    Cancer Other        unsure types   Leukemia Son    Cancer Son         other cancers, possibly colon    Allergies  Allergen Reactions   Penicillins Rash    Health Maintenance  Topic Date Due   DTaP/Tdap/Td (1 - Tdap) Never done   Zoster Vaccines- Shingrix (1 of 2) Never done   COVID-19 Vaccine (2 - Mixed Product risk series) 05/24/2021   Colonoscopy  08/01/2031   INFLUENZA VACCINE  Completed   Hepatitis C Screening  Completed   HIV Screening  Completed   HPV VACCINES  Aged Out    Objective: BP 118/72   Pulse 86   Temp 97.8 F (36.6 C)   Resp 16   SpO2 100%   Physical Exam Constitutional:      Appearance: cachectic male sitting in the wheelchair. HEENT wnl  HENT:     Head: Normocephalic and atraumatic.      Mouth: Mucous membranes are moist.  Eyes:    Conjunctiva/sclera: Conjunctivae normal.     Pupils:   Cardiovascular:     Rate and Rhythm: Normal rate and regular rhythm.     Heart sounds:   Pulmonary:     Effort: Pulmonary effort is normal.     Breath sounds:  Abdominal:     General: Non distended     Palpations: soft. Colostomy +  Musculoskeletal:        General:  left BKA site has healed  Rt leg is bandaged C/D/I  Healing scabbed wound in the left hand  Neurological:     General:    Mental Status: awake, alert and oriented to person, place, and time.   Psychiatric:        Mood and Affect:  calm and cooperative  Lab Results Lab Results  Component Value Date   WBC 6.9 07/01/2023   HGB 9.9 (L) 07/01/2023   HCT 32.4 (L) 07/01/2023   MCV 82.2 07/01/2023   PLT 271 07/01/2023    Lab Results  Component Value Date   CREATININE 1.07 07/01/2023   BUN 21 07/01/2023   NA 137 07/01/2023   K 4.1 07/01/2023   CL 110 07/01/2023   CO2 20 07/01/2023    Lab Results  Component Value Date   ALT 8 (L) 07/01/2023   ALT 10 07/01/2023   AST 17 07/01/2023   GGT 51 07/01/2023   ALKPHOS 104 05/15/2023   BILITOT 0.2 07/01/2023    Lab Results  Component Value Date   TRIG 56 04/28/2018   No results found for:  "LABRPR", "RPRTITER" No results found for: "HIV1RNAQUANT", "HIV1RNAVL", "CD4TABS"    Microbiology Results for orders placed or performed during the hospital encounter of 02/26/23  Blood culture (single)     Status: None   Collection Time: 02/26/23  3:37 PM   Specimen: BLOOD  Result Value Ref Range Status   Specimen Description BLOOD BLOOD RIGHT FOREARM  Final   Special Requests   Final    BOTTLES DRAWN AEROBIC AND ANAEROBIC Blood Culture results may not be optimal due to an inadequate volume of blood received in culture bottles   Culture   Final    NO GROWTH 5 DAYS Performed at [facility], [address] [place], [place] [address]    Report Status 03/03/2023 FINAL  Final  MRSA Next Gen by PCR, Nasal     Status: Abnormal   Collection Time: 02/28/23  4:30 AM   Specimen: Nasal Mucosa; Nasal Swab  Result Value Ref Range Status   MRSA by PCR Next Gen DETECTED (A) NOT DETECTED Final    Comment: RESULT CALLED TO, READ BACK BY AND VERIFIED WITH: SUSANA HERNANDEZ 02/28/2023 AT 0831 SRR (NOTE) The GeneXpert MRSA Assay (FDA approved for NASAL specimens only), is one component of a comprehensive MRSA colonization surveillance program. It is not intended to diagnose MRSA infection nor to guide or monitor treatment for MRSA infections. Test performance is not FDA approved in patients less than 64 years old. Performed at [facility], [address] [place], [place] [address]          Imaging US ABDOMEN COMPLETE W/ELASTOGRAPHY Result Date: 07/09/2023 : PROCEDURE: US ABDOMEN COMPLETE WITH ELASTOGRAPHY HISTORY: Patient is a 62 y/o  Male with Chronic Hepatitis C COMPARISON: CT abdomen/pelvis 11/04/2022. TECHNIQUE: Two-dimensional grayscale and color Doppler ultrasound of the abdomen was performed. Elastography of the liver was also performed. FINDINGS: The pancreas is suboptimally visualized due to overlying bowel gas. The visualized segments appear echogenic. The  liver demonstrates increased echotexture without intrahepatic biliary dilatation. No masses are visualized. The main portal vein demonstrates normal hepatopedal flow. The gallbladder demonstrates a 0.6 cm polyp without pericholecystic fluid or wall thickening. There are no gallstones. The common bile duct measures 0.3 cm. The  right kidney is small measuring 7.7 cm in length. Renal cortical echotexture is increased and thinned. There is no hydronephrosis. There are no stones. There are no cysts. The left kidney measures 11.8 cm in length. Renal cortical echotexture is normal. There is no hydronephrosis. There are no stones. There is a 1.8 cm anechoic cyst visualized medial lower pole cortex. The spleen measures 10.4 cm in length and demonstrates a normal homogenous echotexture. There is no evidence of aneurysm within the visualized segments of the abdominal aorta. The visualized segments of the IVC are unremarkable. ULTRASOUND HEPATIC ELASTOGRAPHY Device: Siemens Helix VTQ Patient position: Supine Transducer 5C1 Number of measurements: 10 Hepatic segment:  8 Median kPa: 5.4 IQR: 0.7 IQR/Median kPa ratio: 0.13 Data quality:  Good Diagnostic category: < or = 9 kPa: in the absence of other known clinical signs, rules out cACLD IMPRESSION: 1. Increased hepatic echotexture, most commonly seen with steatosis. Correlation with LFT's is recommended. 2.  Subcentimeter gallbladder polyp.  No stones. 3.  Atrophic right kidney. 4.  Left renal simple cyst. ULTRASOUND HEPATIC ELASTOGRAPHY: Median kPa: 5.4 Diagnostic category: < or = 9 kPa: in the absence of other known clinical signs, rules out cACLD Diagnostic Categories: < or =5 kPa: high probability of being normal < or =9 kPa: in the absence of other known clinical signs, rules out cACLD >9 kPa and ?13 kPa: suggestive of cACLD, but needs further testing >13 kPa: highly suggestive of cACLD > or =17 kPa: highly suggestive of cACLD with an increased probability of clinically  significant portal hypertension Thank you for allowing [place] to assist in the care of this patient. Electronically Signed   By: Lestine Box M.D.   On: 07/09/2023 07:31    Assessment/Plan # Chronic hepatitis C, treatment naive, genotype 1 a with no cirrhosis - 05/2018 HCV RNA 5,29,000 - Plan to start Epclusa * 12 weeks. DDI reviewed with Pharm D and no significant DDI including atorvastatin. Monitor for myopathy - Fu with Pharm D in a month and then end of tx with me - Plan to start Hep A and B vaccine series next appointment.   # Multiple chronic venous/pressure ulcers in the rt leg, foot, heel, rt second toe - He has grown strep group G, strep viridans, providencia stuartii, MSSA, Group B strep, PsA from the leg wound in the past  - S/p completion of 14 days of levofloxacin started 9/23 for serratia growing in the molecular panel on 9/19 and off abtx currently  - Fu with wound care and vascular   I have personally spent 30  minutes involved in face-to-face and non-face-to-face activities for this patient on the day of the visit. Professional time spent includes the following activities: Preparing to see the patient (review of tests), Obtaining and/or reviewing separately obtained history (admission/discharge record), Performing a medically appropriate examination and/or evaluation , Ordering medications/tests/procedures, referring and communicating with other health care professionals, Documenting clinical information in the EMR, Independently interpreting results (not separately reported), Communicating results to the patient/family/caregiver, Counseling and educating the patient/family/caregiver and Care coordination (not separately reported).   Of note, portions of this note may have been created with voice recognition software. While this note has been edited for accuracy, occasional wrong-word or 'sound-a-like' substitutions may have occurred due to the inherent limitations of voice recognition  software.   Victoriano Lain, MD [facility] 07/14/2023, 4:58 PM

## 2023-07-14 NOTE — Progress Notes (Signed)
Chad [name], Chad [name] ([id number]) [id number] Page 1 of 15 Visit Report for 07/14/2023 Arrival Information Details Patient Name: Date of Service: Chad [name], Chad [name] 07/14/2023 10:15 A M Medical Record Number: [id number] Patient Account Number: [id number] Date of Birth/Sex: Treating RN: June 20, 1961 (62 y.o. Chad [name] Primary Care Delsa Walder: [name] Other Clinician: Referring Dewane Timson: Treating Danee Soller/Extender: [name] in Treatment: 52 Visit Information History Since Last Visit Added or deleted any medications: No Patient Arrived: Wheel Chair Any new allergies or adverse reactions: No Arrival Time: 10:19 Had a fall or experienced change in No Accompanied By: self activities of daily living that may affect Transfer Assistance: None risk of falls: Patient Identification Verified: Yes Hospitalized since last visit: No Secondary Verification Process Completed: Yes Has Dressing in Place as Prescribed: Yes Patient Requires Transmission-Based No Pain Present Now: No Precautions: Patient Has Alerts: Yes Patient Alerts: Patient on Blood Thinner NOT diabetic aspirin 81mg  Lives [place] [facility] ABI R 0.81 11/05/22 ABI L 0.59 11/05/22 [facility] 03/03/23 Electronic Signature(s) Unsigned Entered By: Midge Aver on 07/14/2023 10:25:43 -------------------------------------------------------------------------------- Clinic Level of Care Assessment Details Patient Name: Date of Service: Chad [name], Chad [name] 07/14/2023 10:15 A M Medical Record Number: [id number] Patient Account Number: [id number] Date of Birth/Sex: Treating RN: 1960/11/17 (62 y.o. Chad [name] Primary Care Jakai Onofre: [name] Other Clinician: Referring Brea Coleson: Treating Toiya Morrish/Extender: [name] Weeks in Treatment: 44 Clinic Level of Care Assessment Items TOOL 4 Quantity Score X- 1 0 Use when only an EandM is performed on FOLLOW-UP  visit MOMEN, GROSSO ([id number]) [phone].pdf Page 2 of 15 ASSESSMENTS - Nursing Assessment / Reassessment X- 1 10 Reassessment of Co-morbidities (includes updates in patient status) X- 1 5 Reassessment of Adherence to Treatment Plan ASSESSMENTS - Wound and Skin A ssessment / Reassessment []  - 0 Simple Wound Assessment / Reassessment - one wound X- 5 5 Complex Wound Assessment / Reassessment - multiple wounds []  - 0 Dermatologic / Skin Assessment (not related to wound area) ASSESSMENTS - Focused Assessment []  - 0 Circumferential Edema Measurements - multi extremities []  - 0 Nutritional Assessment / Counseling / Intervention []  - 0 Lower Extremity Assessment (monofilament, tuning fork, pulses) []  - 0 Peripheral Arterial Disease Assessment (using hand held doppler) ASSESSMENTS - Ostomy and/or Continence Assessment and Care []  - 0 Incontinence Assessment and Management []  - 0 Ostomy Care Assessment and Management (repouching, etc.) PROCESS - Coordination of Care []  - 0 Simple Patient / Family Education for ongoing care X- 1 20 Complex (extensive) Patient / Family Education for ongoing care X- 1 10 Staff obtains Chiropractor, Records, T Results / Process Orders est []  - 0 Staff telephones HHA, Nursing Homes / Clarify orders / etc []  - 0 Routine Transfer to another Facility (non-emergent condition) []  - 0 Routine Hospital Admission (non-emergent condition) []  - 0 New Admissions / Manufacturing engineer / Ordering NPWT Apligraf, etc. , []  - 0 Emergency Hospital Admission (emergent condition) []  - 0 Simple Discharge Coordination X- 1 15 Complex (extensive) Discharge Coordination PROCESS - Special Needs []  - 0 Pediatric / Minor Patient Management []  - 0 Isolation Patient Management []  - 0 Hearing / Language / Visual special needs []  - 0 Assessment of Community assistance (transportation, D/C planning, etc.) []  - 0 Additional assistance /  Altered mentation []  - 0 Support Surface(s) Assessment (bed, cushion, seat, etc.) INTERVENTIONS - Wound Cleansing / Measurement []  - 0 Simple Wound Cleansing - one wound X- 5 5 Complex Wound Cleansing - multiple wounds X- 1  5 Wound Imaging (photographs - any number of wounds) []  - 0 Wound Tracing (instead of photographs) []  - 0 Simple Wound Measurement - one wound X- 5 5 Complex Wound Measurement - multiple wounds INTERVENTIONS - Wound Dressings []  - 0 Small Wound Dressing one or multiple wounds X- 5 15 Medium Wound Dressing one or multiple wounds []  - 0 Large Wound Dressing one or multiple wounds []  - 0 Application of Medications - topical [name] ([id number]) [id number] Page 3 of 15 []  - 0 Application of Medications - injection INTERVENTIONS - Miscellaneous []  - 0 External ear exam []  - 0 Specimen Collection (cultures, biopsies, blood, body fluids, etc.) []  - 0 Specimen(s) / Culture(s) sent or taken to Lab for analysis []  - 0 Patient Transfer (multiple staff / Michiel Sites Lift / Similar devices) []  - 0 Simple Staple / Suture removal (25 or less) []  - 0 Complex Staple / Suture removal (26 or more) []  - 0 Hypo / Hyperglycemic Management (close monitor of Blood Glucose) []  - 0 Ankle / Brachial Index (ABI) - do not check if billed separately X- 1 5 Vital Signs Has the patient been seen at the hospital within the last three years: Yes Total Score: 220 Level Of Care: New/Established - Level 5 Electronic Signature(s) Unsigned Entered By: Midge Aver on 07/14/2023 11:57:29 -------------------------------------------------------------------------------- Encounter Discharge Information Details Patient Name: Date of Service: Chad [name], Chad [name] 07/14/2023 10:15 A M Medical Record Number: [id number] Patient Account Number: [id number] Date of Birth/Sex: Treating RN: 12-28-1960 (62 y.o. Chad [name] Primary Care Anndrea Mihelich: [name] Other  Clinician: Referring Hever Castilleja: Treating Damione Robideau/Extender: [name] in Treatment: 56 Encounter Discharge Information Items Discharge Condition: Stable Ambulatory Status: Wheelchair Discharge Destination: Skilled Nursing Facility Telephoned: No Orders Sent: Yes Transportation: Other Accompanied By: self Schedule Follow-up Appointment: Yes Clinical Summary of Care: Electronic Signature(s) Signed: 07/14/2023 11:59:43 AM By: Midge Aver MSN RN CNS WTA Entered By: Midge Aver on 07/14/2023 11:59:43 [name] ([id number]) [id number] Page 4 of 15 -------------------------------------------------------------------------------- Lower Extremity Assessment Details Patient Name: Date of Service: Chad [name], Chad [name] 07/14/2023 10:15 A M Medical Record Number: [id number] Patient Account Number: [id number] Date of Birth/Sex: Treating RN: [birth year] (62 y.o. Chad [name] Primary Care Brailynn Breth: [name] Other Clinician: Referring Lorrene Graef: Treating Dewane Timson/Extender: [name] Weeks in Treatment: 83 Edema Assessment Assessed: [Left: No] [Right: Yes] Edema: [Left: N] [Right: o] Calf Left: Right: Point of Measurement: 33 cm From Medial Instep 41 cm Ankle Left: Right: Point of Measurement: 12 cm From Medial Instep 25 cm Vascular Assessment Pulses: Dorsalis Pedis Palpable: [Right:Yes] Extremity colors, hair growth, and conditions: Extremity Color: [Right:Red] Hair Growth on Extremity: [Right:No] Temperature of Extremity: [Right:Warm] Capillary Refill: [Right:> 3 seconds] Dependent Rubor: [Right:No] Blanched when Elevated: [Right:No No] Toe Nail Assessment Left: Right: Thick: Yes Discolored: Yes Deformed: Yes Improper Length and Hygiene: Yes Electronic Signature(s) Unsigned Entered By: Midge Aver on 07/14/2023 10:52:17 Signature(s): [name] ([id number]) [id number] Date(s):  [id number] Page 5 of 15 -------------------------------------------------------------------------------- Multi Wound Chart Details Patient Name: Date of Service: Chad [name], Chad [name] 07/14/2023 10:15 A M Medical Record Number: [id number] Patient Account Number: [id number] Date of Birth/Sex: Treating RN: July 04, 1961 (62 y.o. Chad [name] Primary Care Kiyara Bouffard: [name] Other Clinician: Referring Yuliza Cara: Treating Zahmir Lalla/Extender: [name] Weeks in Treatment: 34 Vital Signs Height(in): 69 Pulse(bpm): 97 Weight(lbs): 150 Blood Pressure(mmHg): 94/63 Body Mass Index(BMI): 22.1 Temperature(F): 98.0 Respiratory Rate(breaths/min): 16 [16:Photos:] Right Calcaneus Right, Medial T Second oe Right, Dorsal Foot Wound  Location: Gradually Appeared Pressure Injury Pressure Injury Wounding Event: Pressure Ulcer Pressure Ulcer Venous Leg Ulcer Primary Etiology: Anemia, Chronic Obstructive Anemia, Chronic Obstructive Anemia, Chronic Obstructive Comorbid History: Pulmonary Disease (COPD), Coronary Pulmonary Disease (COPD), Coronary Pulmonary Disease (COPD), Coronary Artery Disease, Peripheral Arterial Artery Disease, Peripheral Arterial Artery Disease, Peripheral Arterial Disease, Peripheral Venous Disease, Disease, Peripheral Venous Disease, Disease, Peripheral Venous Disease, Hepatitis C, Osteoarthritis, Hepatitis C, Osteoarthritis, Hepatitis C, Osteoarthritis, Neuropathy, Received Chemotherapy Neuropathy, Received Chemotherapy Neuropathy, Received Chemotherapy 09/26/2022 09/26/2022 03/02/2023 Date Acquired: 40 39 15 Weeks of Treatment: Open Open Open Wound Status: No No No Wound Recurrence: Yes No Yes Clustered Wound: 10x11.5x0.3 3.2x1.3x0.1 5x4.5x0.2 Measurements L x W x D (cm) 90.321 3.267 17.671 A (cm) : rea 27.096 0.327 3.534 Volume (cm) : -9488.20% -494.00% -900.10% % Reduction in Area: -14312.80% -494.50% -1896.60% % Reduction in  Volume: Category/Stage III Category/Stage III Full Thickness Without Exposed Classification: Support Structures Medium Medium Medium Exudate Amount: Serosanguineous Serosanguineous Serous Exudate Type: red, brown red, brown amber Exudate Color: Wound Number: 22 23 N/A Photos: N/A Right, Anterior Lower Leg Right, Posterior Lower Leg N/A Wound Location: Pressure Injury Pressure Injury N/A Wounding Event: Venous Leg Ulcer Pressure Ulcer N/A Primary Etiology: Anemia, Chronic Obstructive Anemia, Chronic Obstructive N/A Comorbid HistoryMEREDITH, HOESE ([id number]) [id number] Page 6 of 15 Pulmonary Disease (COPD), Coronary Pulmonary Disease (COPD), Coronary Artery Disease, Peripheral Arterial Artery Disease, Peripheral Arterial Disease, Peripheral Venous Disease, Disease, Peripheral Venous Disease, Hepatitis C, Osteoarthritis, Hepatitis C, Osteoarthritis, Neuropathy, Received Chemotherapy Neuropathy, Received Chemotherapy 03/02/2023 05/02/2023 N/A Date Acquired: 15 9 N/A Weeks of Treatment: Open Open N/A Wound Status: No No N/A Wound Recurrence: Yes Yes N/A Clustered Wound: 17x12x0.2 13x13x0.2 N/A Measurements L x W x D (cm) 160.221 132.732 N/A A (cm) : rea 32.044 26.546 N/A Volume (cm) : -6700.60% -745.00% N/A % Reduction in Area: -6703.40% -1589.80% N/A % Reduction in Volume: Full Thickness Without Exposed Category/Stage III N/A Classification: Support Structures Medium Medium N/A Exudate Amount: Serous Serosanguineous N/A Exudate Type: amber red, brown N/A Exudate Color: Treatment Notes Electronic Signature(s) Signed: 07/14/2023 11:55:46 AM By: Midge Aver MSN RN CNS WTA Entered By: Midge Aver on 07/14/2023 11:55:45 -------------------------------------------------------------------------------- Multi-Disciplinary Care Plan Details Patient Name: Date of Service: Chad [name] 07/14/2023 10:15 A M Medical Record Number:  [id number] Patient Account Number: [id number] Date of Birth/Sex: Treating RN: 03/17/1961 (62 y.o. Chad [name] Primary Care Kassity Woodson: [name] Other Clinician: Referring Berkleigh Beckles: Treating Lakeem Rozo/Extender: [name] Weeks in Treatment: 74 Active Inactive Venous Leg Ulcer Nursing Diagnoses: Knowledge deficit related to disease process and management Goals: Patient will maintain optimal edema control Date Initiated: 12/06/2021 Target Resolution Date: 07/26/2023 Goal Status: Active Patient/caregiver will verbalize understanding of disease process and disease management Date Initiated: 12/06/2021 Date Inactivated: 12/19/2022 Target Resolution Date: 11/27/2022 Goal Status: Met Interventions: Assess peripheral edema status every visit. Compression as ordered Notes: Electronic Signature(s) Signed: 07/14/2023 11:57:44 AM By: Midge Aver MSN RN CNS WTA Entered By: Midge Aver on 07/14/2023 11:57:43 [name] ([id number]) [id number] Page 7 of 15 -------------------------------------------------------------------------------- Pain Assessment Details Patient Name: Date of Service: Chad [name], Chad [name] 07/14/2023 10:15 A M Medical Record Number: [id number] Patient Account Number: [id number] Date of Birth/Sex: Treating RN: 01/24/1961 (62 y.o. Chad [name] Primary Care Kahmya Pinkham: [name] Other Clinician: Referring Amoura Ransier: Treating Debbra Digiulio/Extender: [name] Weeks in Treatment: 24 Active Problems Location of Pain Severity and Description of Pain Patient Has Paino Yes Site Locations Rate the pain. Current Pain Level: 7 Pain  Management and Medication Current Pain Management: Electronic Signature(s) Unsigned Entered By: Midge Aver on 07/14/2023 10:48:25 -------------------------------------------------------------------------------- Patient/Caregiver Education Details Patient Name: Date of  Service: Chad [name], Chad [name] 12/17/2024andnbsp10:15 A M Medical Record Number: [id number] Patient Account Number: [id number] Date of Birth/Gender: Treating RN: 11-22-1960 (62 y.o. Onel, Kong, [name] ([id number]) [id number] Page 8 of 15 Primary Care Physician: [name] Other Clinician: Referring Physician: Treating Physician/Extender: [name] in Treatment: 45 Education Assessment Education Provided To: Patient Education Topics Provided Wound/Skin Impairment: Handouts: Caring for Your Ulcer Methods: Explain/Verbal Responses: State content correctly Electronic Signature(s) Unsigned Entered By: Midge Aver on 07/14/2023 11:57:56 -------------------------------------------------------------------------------- Wound Assessment Details Patient Name: Date of Service: Chad [name], Chad [name] 07/14/2023 10:15 A M Medical Record Number: [id number] Patient Account Number: [id number] Date of Birth/Sex: Treating RN: 1960-09-15 (62 y.o. Chad [name] Primary Care Leona Alen: [name] Other Clinician: Referring Janziel Hockett: Treating Josaphine Shimamoto/Extender: [name] Weeks in Treatment: 83 Wound Status Wound Number: 16 Primary Pressure Ulcer Etiology: Wound Location: Right Calcaneus Wound Open Wounding Event: Gradually Appeared Status: Date Acquired: 09/26/2022 Comorbid Anemia, Chronic Obstructive Pulmonary Disease (COPD), Coronary Weeks Of Treatment: 40 History: Artery Disease, Peripheral Arterial Disease, Peripheral Venous Clustered Wound: Yes Disease, Hepatitis C, Osteoarthritis, Neuropathy, Received Chemotherapy Photos Wound Measurements Length: (cm) 10 Width: (cm) 11.5 Depth: (cm) 0.3 Area: (cm) 90.321 Somoza, Caton ([id number]) Volume: (cm) % Reduction in Area: -9488.2% % Reduction in Volume: -14312.8% [id number] Page 9 of 15 27.096 Wound  Description Classification: Exudate Amount: Exudate Type: Exudate Color: Category/Stage III Medium Serosanguineous red, brown Treatment Notes Wound #16 (Calcaneus) Wound Laterality: Right Cleanser Soap and Water Discharge Instruction: Gently cleanse wound with antibacterial soap, rinse and pat dry prior to dressing wounds Wound Cleanser Discharge Instruction: Wash your hands with soap and water. Remove old dressing, discard into plastic bag and place into trash. Cleanse the wound with Wound Cleanser prior to applying a clean dressing using gauze sponges, not tissues or cotton balls. Do not scrub or use excessive force. Pat dry using gauze sponges, not tissue or cotton balls. Peri-Wound Care Topical Primary Dressing Secondary Dressing Xtrasorb Large 6x9 (in/in) Discharge Instruction: Apply to wound as directed. Do not cut. Heel Cup Secured With [employer] or Non-Sterile 6-ply 4.5x4 (yd/yd) Discharge Instruction: Apply Kerlix as directed Tubigrip Size D, 3x10 (in/yd) Compression Wrap Compression Stockings Add-Ons Electronic Signature(s) Unsigned Entered By: Midge Aver on 07/14/2023 10:50:01 -------------------------------------------------------------------------------- Wound Assessment Details Patient Name: Date of Service: Chad [name], Chad [name] 07/14/2023 10:15 A M Medical Record Number: [id number] Patient Account Number: [id number] Date of Birth/Sex: Treating RN: [birth year] (62 y.o. Chad [name] Primary Care Nyeem Stoke: [name] Other Clinician: Referring Analiah Drum: Treating Graeson Nouri/Extender: [name] Weeks in Treatment: 83 Wound Status Wound Number: 18 Primary Pressure Ulcer Etiology: Wound Location: Right, Medial T Second oe Wound Open Wounding Event: Pressure Injury Status: Date Acquired: 09/26/2022 Comorbid Anemia, Chronic Obstructive Pulmonary Disease (COPD), Coronary Weeks Of Treatment: [address] [place] ([id number])  [phone].pdf Page 10 of 15 Weeks Of Treatment: 39 History: Artery Disease, Peripheral Arterial Disease, Peripheral Venous Clustered Wound: No Disease, Hepatitis C, Osteoarthritis, Neuropathy, Received Chemotherapy Photos Wound Measurements Length: (cm) 3.2 Width: (cm) 1.3 Depth: (cm) 0.1 Area: (cm) 3.267 Volume: (cm) 0.327 % Reduction in Area: -494% % Reduction in Volume: -494.5% Wound Description Classification: Exudate Amount: Exudate Type: Exudate Color: Category/Stage III Medium Serosanguineous red, brown Treatment Notes Wound #18 (Toe Second) Wound Laterality: Right, Medial Cleanser Soap and Water Discharge Instruction: Gently cleanse wound  with antibacterial soap, rinse and pat dry prior to dressing wounds Wound Cleanser Discharge Instruction: Wash your hands with soap and water. Remove old dressing, discard into plastic bag and place into trash. Cleanse the wound with Wound Cleanser prior to applying a clean dressing using gauze sponges, not tissues or cotton balls. Do not scrub or use excessive force. Pat dry using gauze sponges, not tissue or cotton balls. Peri-Wound Care Topical Primary Dressing Hydrofera Blue Ready Transfer Foam, 2.5x2.5 (in/in) Discharge Instruction: Apply Hydrofera Blue Ready to wound bed as directed Secondary Dressing Xtrasorb Large 6x9 (in/in) Discharge Instruction: Apply to wound as directed. Do not cut. Secured With [employer] or Non-Sterile 6-ply 4.5x4 (yd/yd) Discharge Instruction: Apply Kerlix as directed Tubigrip Size D, 3x10 (in/yd) Compression Wrap Compression Stockings Add-Ons Electronic Signature(s) Unsigned Entered By: Midge Aver on 07/14/2023 10:50:24 Signature(s): [name] ([id number]) [phone] Date(s): [id number].pdf Page 11 of 15 -------------------------------------------------------------------------------- Wound Assessment Details Patient Name: Date of  Service: Chad [name], FIORETTI 07/14/2023 10:15 A M Medical Record Number: [id number] Patient Account Number: [id number] Date of Birth/Sex: Treating RN: 05-Nov-1960 (62 y.o. Chad [name] Primary Care Monea Pesantez: [name] Other Clinician: Referring Loise Esguerra: Treating Hezakiah Champeau/Extender: [name] Weeks in Treatment: 83 Wound Status Wound Number: 21 Primary Venous Leg Ulcer Etiology: Wound Location: Right, Dorsal Foot Wound Open Wounding Event: Pressure Injury Status: Date Acquired: 03/02/2023 Comorbid Anemia, Chronic Obstructive Pulmonary Disease (COPD), Coronary Weeks Of Treatment: 15 History: Artery Disease, Peripheral Arterial Disease, Peripheral Venous Clustered Wound: Yes Disease, Hepatitis C, Osteoarthritis, Neuropathy, Received Chemotherapy Photos Wound Measurements Length: (cm) 5 Width: (cm) 4.5 Depth: (cm) 0.2 Area: (cm) 17.671 Volume: (cm) 3.534 % Reduction in Area: -900.1% % Reduction in Volume: -1896.6% Wound Description Classification: Full Thickness Without Exposed Support Exudate Amount: Medium Exudate Type: Serous Exudate Color: amber Structures Treatment Notes Wound #21 (Foot) Wound Laterality: Dorsal, Right Cleanser Soap and Water Discharge Instruction: Gently cleanse wound with antibacterial soap, rinse and pat dry prior to dressing wounds Wound Cleanser Discharge Instruction: Wash your hands with soap and water. Remove old dressing, discard into plastic bag and place into trash. Cleanse the wound with Wound Cleanser prior to applying a clean dressing using gauze sponges, not tissues or cotton balls. Do not scrub or use excessive force. Pat dry using gauze sponges, not tissue or cotton balls. Peri-Wound Care Topical Primary Dressing MD, GORY ([id number]) [id number] Page 12 of 15 Secondary Dressing Xtrasorb Large 6x9 (in/in) Discharge Instruction: Apply to wound as directed. Do not cut. Secured  With [employer] or Non-Sterile 6-ply 4.5x4 (yd/yd) Discharge Instruction: Apply Kerlix as directed Tubigrip Size D, 3x10 (in/yd) Compression Wrap Compression Stockings Add-Ons Electronic Signature(s) Unsigned Entered By: Midge Aver on 07/14/2023 10:50:49 -------------------------------------------------------------------------------- Wound Assessment Details Patient Name: Date of Service: JACKSTON, MONTANEZ 07/14/2023 10:15 A M Medical Record Number: [id number] Patient Account Number: [id number] Date of Birth/Sex: Treating RN: Oct 23, 1960 (62 y.o. Chad [name] Primary Care Chelsei Mcchesney: [name] Other Clinician: Referring Gowri Suchan: Treating Gil Ingwersen/Extender: [name] Weeks in Treatment: 83 Wound Status Wound Number: 22 Primary Venous Leg Ulcer Etiology: Wound Location: Right, Anterior Lower Leg Wound Open Wounding Event: Pressure Injury Status: Date Acquired: 03/02/2023 Comorbid Anemia, Chronic Obstructive Pulmonary Disease (COPD), Coronary Weeks Of Treatment: 15 History: Artery Disease, Peripheral Arterial Disease, Peripheral Venous Clustered Wound: Yes Disease, Hepatitis C, Osteoarthritis, Neuropathy, Received Chemotherapy Photos Wound Measurements Length: (cm) 17 Width: (cm) 12 Depth: (cm) 0.2 Area: (cm) 160.221 Volume: (cm) 32.044 % Reduction in Area: -6700.6% %  Reduction in Volume: -6703.4% Wound Description LEANNE, BUCKERT ([id number]) Classification: Full Thickness Without Exposed Support Structures Exudate Amount: Medium Exudate Type: Serous Exudate Color: amber [id number] Page 13 of 15 Treatment Notes Wound #22 (Lower Leg) Wound Laterality: Right, Anterior Cleanser Soap and Water Discharge Instruction: Gently cleanse wound with antibacterial soap, rinse and pat dry prior to dressing wounds Wound Cleanser Discharge Instruction: Wash your hands with soap and water. Remove old dressing, discard into  plastic bag and place into trash. Cleanse the wound with Wound Cleanser prior to applying a clean dressing using gauze sponges, not tissues or cotton balls. Do not scrub or use excessive force. Pat dry using gauze sponges, not tissue or cotton balls. Peri-Wound Care Topical Primary Dressing Secondary Dressing Xtrasorb Large 6x9 (in/in) Discharge Instruction: Apply to wound as directed. Do not cut. Secured With [employer] or Non-Sterile 6-ply 4.5x4 (yd/yd) Discharge Instruction: Apply Kerlix as directed Tubigrip Size D, 3x10 (in/yd) Compression Wrap Compression Stockings Add-Ons Electronic Signature(s) Unsigned Entered By: Midge Aver on 07/14/2023 10:51:10 -------------------------------------------------------------------------------- Wound Assessment Details Patient Name: Date of Service: HUEL, CUTRER 07/14/2023 10:15 A M Medical Record Number: [id number] Patient Account Number: [id number] Date of Birth/Sex: Treating RN: October 24, 1960 (62 y.o. Chad [name] Primary Care Rafaelita Foister: [name] Other Clinician: Referring Ninette Cotta: Treating Etna Forquer/Extender: [name] Weeks in Treatment: 83 Wound Status Wound Number: 23 Primary Pressure Ulcer Etiology: Wound Location: Right, Posterior Lower Leg Wound Open Wounding Event: Pressure Injury Status: Date Acquired: 05/02/2023 Comorbid Anemia, Chronic Obstructive Pulmonary Disease (COPD), Coronary Weeks Of Treatment: 9 History: Artery Disease, Peripheral Arterial Disease, Peripheral Venous Clustered Wound: Yes Disease, Hepatitis C, Osteoarthritis, Neuropathy, Received Chemotherapy DAIJON, PIERCEFIELD ([id number]) [id number] Page 14 of 15 Photos Wound Measurements Length: (cm) 13 Width: (cm) 13 Depth: (cm) 0.2 Area: (cm) 132.732 Volume: (cm) 26.546 % Reduction in Area: -745% % Reduction in Volume: -1589.8% Wound Description Classification: Exudate Amount: Exudate  Type: Exudate Color: Category/Stage III Medium Serosanguineous red, brown Treatment Notes Wound #23 (Lower Leg) Wound Laterality: Right, Posterior Cleanser Soap and Water Discharge Instruction: Gently cleanse wound with antibacterial soap, rinse and pat dry prior to dressing wounds Wound Cleanser Discharge Instruction: Wash your hands with soap and water. Remove old dressing, discard into plastic bag and place into trash. Cleanse the wound with Wound Cleanser prior to applying a clean dressing using gauze sponges, not tissues or cotton balls. Do not scrub or use excessive force. Pat dry using gauze sponges, not tissue or cotton balls. Peri-Wound Care Topical Primary Dressing Secondary Dressing Xtrasorb Large 6x9 (in/in) Discharge Instruction: Apply to wound as directed. Do not cut. Secured With [employer] or Non-Sterile 6-ply 4.5x4 (yd/yd) Discharge Instruction: Apply Kerlix as directed Tubigrip Size D, 3x10 (in/yd) Compression Wrap Compression Stockings Add-Ons Electronic Signature(s) Unsigned Entered By: Midge Aver on 07/14/2023 10:51:36 Signature(s): [name] ([id number]) [id number] Date(s): 90.pdf Page 15 of 15 -------------------------------------------------------------------------------- Vitals Details Patient Name: Date of Service: LIZZIE, DAGAN 07/14/2023 10:15 A M Medical Record Number: [id number] Patient Account Number: [id number] Date of Birth/Sex: Treating RN: April 23, 1961 (62 y.o. Chad [name] Primary Care Ryane Canavan: [name] Other Clinician: Referring Ajmal Kathan: Treating Huxley Vanwagoner/Extender: [name] Weeks in Treatment: 45 Vital Signs Time Taken: 10:42 Temperature (F): 98.0 Height (in): 69 Pulse (bpm): 97 Weight (lbs): 150 Respiratory Rate (breaths/min): 16 Body Mass Index (BMI): 22.1 Blood Pressure (mmHg): 94/63 Reference Range: 80 - 120 mg / dl Electronic Signature(s) Unsigned Entered  By: Midge Aver on 07/14/2023 10:48:12 Signature(s): Date(s):

## 2023-07-14 NOTE — Progress Notes (Signed)
Specialty Pharmacy Initiation Note   Chad [name] is a 62 y.o. male who will be followed by the specialty pharmacy service for RxSp Hepatitis C    Review of administration, indication, effectiveness, safety, potential side effects, storage/disposable, and missed dose instructions occurred today for patient's specialty medication(s) Sofosbuvir-Velpatasvir     Patient/Caregiver did not have any additional questions or concerns.   Patient's therapy is appropriate to: Initiate    Goals Addressed             This Visit's Progress    Achieve virologic cure as evidenced by SVR       Patient is initiating therapy. Patient will adhere to provider and/or lab appointments and be evaluated at upcoming provider appointment to assess progress      Comply with lab assessments       Patient is initiating therapy. Patient will adhere to provider and/or lab appointments      Maintain optimal adherence to therapy       Patient is initiating therapy. Patient will be evaluated at upcoming provider appointment to assess progress         Jennette Kettle Specialty Pharmacist

## 2023-07-14 NOTE — Progress Notes (Signed)
Patient is approved to receive Epclusa x 12 weeks for chronic Hepatitis C infection. Counseled patient to take Epclusa daily with or without food. Encouraged patient not to miss any doses and explained how their chance of cure could go down with each dose missed. Counseled patient on what to do if dose is missed - if it is closer to the missed dose take immediately; if closer to next dose skip dose and take the next dose at the usual time. Counseled patient on common side effects such as headache, fatigue, and nausea and that these normally decrease with time. I reviewed patient medications and found one drug interaction with atorvastatin 10 mg; will monitor for signs of myopathies. Discussed with patient that there are several drug interactions including acid suppressants. Instructed patient to call clinic if he wishes to start a new medication during course of therapy. Also advised patient to call if he experiences any side effects. Patient will follow-up with me in the pharmacy clinic on 1/14.  Margarite Gouge, PharmD, CPP, BCIDP, AAHIVP Clinical Pharmacist Practitioner Infectious Diseases Clinical Pharmacist [facility]

## 2023-07-15 ENCOUNTER — Other Ambulatory Visit: Payer: Self-pay

## 2023-07-28 ENCOUNTER — Encounter: Payer: Medicaid Other | Admitting: Internal Medicine

## 2023-07-28 DIAGNOSIS — I87332 Chronic venous hypertension (idiopathic) with ulcer and inflammation of left lower extremity: Secondary | ICD-10-CM | POA: Diagnosis not present

## 2023-07-29 NOTE — Progress Notes (Signed)
 Chad [name] ([id number]) [id number] Page 1 of 17 Visit Report for 07/28/2023 HPI Details Patient Name: Date of Service: Chad [name], Chad [name] 07/28/2023 10:15 A M Medical Record Number: [id number] Patient Account Number: [id number] Date of Birth/Sex: Treating RN: 1961/06/25 (63 y.o. [name] Primary Care Provider: Gerlean Maier Other Clinician: Referring Provider: Treating Provider/Extender: Chad [name], Chad [name] Gerlean Maier Weeks in Treatment: 14 History of Present Illness HPI Description: 10/08/18 on evaluation today patient actually presents to our office for initial evaluation concerning wounds that he has of the bilateral lower extremities. 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 He also has bilateral venous stasis. He apparently has been homeless currently following discharge from the hospital apparently he has been placed at [facility] which is is a skilled nursing facility locally. Nonetheless fortunately he does not show any signs of infection at this time which is good news. In fact several of the wound actually appears to be showing some signs of improvement already in my pinion. There are a couple areas in the left leg in particular there likely gonna require some sharp debridement to help clear away some necrotic tissue and help with more sufficient healing. No fevers, chills, nausea, or vomiting noted at this time. 10/15/18 on evaluation today patient actually appears to be doing very well in regard to his bilateral lower extremities. He's been tolerating the dressing changes without complication. Fortunately there does not appear to be any evidence of active infection at this time which is great news. Overall I'm actually very pleased with how this has progressed in just one visits  time. Readmission: 08/14/2020 upon evaluation today patient presents for re-evaluation here in our clinic. He is having issues with his left ankle region as well as his right toe and his right heel. He tells me that the toe and heel actually began as a area that was itching that he was scratching and then subsequently opened up into wounds. These may have been abscess areas I presume based on what I am seeing currently. With regard to his left ankle region he tells me this was a similar type occurrence although he does have venous stasis this very well may be more of a venous leg ulcer more than anything. Nonetheless I do believe that the patient would benefit from appropriate and aggressive wound care to try to help get things under better control here. He does have history of a stroke on the left side affecting him to some degree there that he is able to stand although he does have some residual weakness. Otherwise again the patient does have chronic venous insufficiency as previously noted. His arterial studies most recently obtained showed that he had an ABI on the right of 1.16 with a TBI of 0.52 and on the left and ABI of 1.14 with a TBI of 0.81. That was obtained on 06/19/2020. 08/28/2020 upon evaluation today patient appears to be doing decently well in regard to his wounds in general. He has been tolerating the dressing changes without complication. Fortunately there does not appear to be any signs of active infection which is great news. With that being said I think the Hydrofera Blue is doing a good job I would recommend that we likely continue with that currently. 09/11/2020 upon evaluation today patient's wounds did not appear to be doing too poorly but  again he is not really showing signs of significant improvement with regard to any of the wounds on the right. None of them have Hydrofera Blue on them I am not exactly sure why this is not being followed as the facility did not contact us  to  let us  know of any issues with obtaining dressings or otherwise. With that being said he is supposed to be using Hydrofera Blue on both of the wounds on the right foot as well as the ankle wound on the left side. 09/18/2020 upon evaluation today patient appears to be doing poorly with regard to his wounds. Again right now the left ankle in particular showed signs of extreme maceration. Apparently he was told by someone with staff at [facility] they could not get the Hydrofera Blue. With that being said this is something that is never been relayed to us  one way or another. Also the patient subsequently has not supposed to have a border gauze dressing on. He should have an ABD pad and roll gauze to secure as this drains much too much just to have a border gauze dressing to cover. Nonetheless the fact that they are not using the appropriate dressing is directly causing deterioration of the left ankle wound it is significantly worse today compared to what it was previous. I did attempt to call [facility] while the patient was here I called three times and got no one to even pick up the phone. After this I had my for an office coordinator call and she was able to finally get through and leave a message with the D ON as of dictation of this note which is roughly about an hour and a half later I still have not been able to speak with anyone at the facility. 09/25/2020 upon evaluation today patient actually showing signs of good improvement which is excellent news. He has been tolerating the dressing changes without complication. Fortunately there is no signs of active infection which is great news. No fevers, chills, nausea, vomiting, or diarrhea. I do feel like the facility has been doing a much better job at taking care of him as far as the dressings are concerned. However the director of nursing never did call me back. 10/09/2020 upon evaluation today patient appears to be doing well with  regard to his wound. The toe ulcer did require some debridement but the other 2 areas actually appear to be doing quite well. 10/19/2020 upon evaluation today patient actually appears to be doing very well in regard to his wounds. In fact the heel does appear to be completely healed. The toe is doing better in the medial ankle on the left is also doing better. Overall I think he is headed in the right direction. 10/26/2020 upon evaluation today patient appears to be doing well with regard to his wound. He is showing signs of improvement which is great news and overall I am very pleased with where things stand today. No fevers, chills, nausea, vomiting, or diarrhea. 11/02/2020 upon evaluation today patient appears to be doing well with regard to his wounds. He has been tolerating the dressing changes without complication overall I am extremely pleased with where things stand today. He in regard to the toe is almost completely healed and the medial ankle on the left is doing much better. Chad [name], Chad [name] ([id number]) [id number] Page 2 of 17 11/09/2020 upon evaluation today patient appears to be doing a little poorly in regard to his left medial ankle ulcer. Fortunately there  does not appear to be any signs of systemic infection but unfortunately locally he does appear to be infected in fact he has blue-green drainage consistent with Pseudomonas. 11/16/2020 upon evaluation today patient appears to be doing well with regard to his wound. It actually appears to be doing better. I did place him on gentamicin  cream since the Cipro  was actually resistant even though he was positive for Pseudomonas on culture. Overall I think that he does seem to be doing better though I am unsure whether or not they have actually been putting the cream on. The patient is not sure that we did talk to the nurse directly and she was going to initiate that treatment. Fortunately there does not appear to be  any signs of active infection at this time. No fevers, chills, nausea, vomiting, or diarrhea. 4/28; the area on the right second toe is close to healed. Left medial ankle required debridement 12/07/2020 upon evaluation today patient appears to be doing well with regard to his wounds. In fact the right second toe appears to be completely healed which is great news. Fortunately there does not appear to be any signs of active infection at this time which is also great news. I think we can probably discontinue the gentamicin  on top of everything else. 12/14/2020 upon evaluation today patient appears to be doing well with regard to his wound. He is making good progress and overall very pleased with where things stand today. There is no signs of active infection at this time which is great news. 12/28/2020 upon evaluation today patient appears to be doing well with regard to his wounds. He has been tolerating the dressing changes without complication. Fortunately there is no signs of active infection at this time. No fevers, chills, nausea, vomiting, or diarrhea. 12/28/2020 upon evaluation today patient's wound bed actually showed signs of excellent improvement. He has great epithelization and granulation I do not see any signs of infection overall I am extremely pleased with where things stand at this point. No fevers, chills, nausea, vomiting, or diarrhea. 01/11/2021 upon evaluation today patient appears to be doing well with regard to his wound on his leg. He has been tolerating the dressing changes without complication. Fortunately there does not appear to be any signs of active infection which is great news. No fevers, chills, nausea, vomiting, or diarrhea. 01/25/2021 upon evaluation today patient appears to be doing well with regard to his wound. He has been tolerating the dressing changes without complication. Fortunately the collagen seems to be doing a great job which is excellent news. No fevers, chills,  nausea, vomiting, or diarrhea. 02/08/2021 upon evaluation today patient's wound is actually looking a little bit worse especially in the periwound compared to previous. Fortunately there does not appear to be any signs of infection which is great news with that being said he does have some irritation around the periphery of the wound which has me more concerned. He actually had a dressing on that had not been changed in 3 days. He also is supposed to have daily dressing changes. With regard to the dressing applied he had a silver alginate dressing and silver collagen is what is recommended and ordered. He also had no Desitin around the edges of the wound in the periwound region although that is on the order inspect to be done as well. In general I was very concerned I did contact [facility] actually spoke with Sonny who is the scheduling individual and subsequently she stated that  she would pass the information to the D ON apparently the D ON was not available to talk to me when I call today. 02/18/2021 upon evaluation today patient's wound is actually showing signs of improvement. Fortunately there does not appear to be any evidence of infection which is great news overall I am extremely pleased with where things stand today. No fevers, chills, nausea, vomiting, or diarrhea. 8/3; patient presents for 1 week follow-up. He has no issues or complaints today. He denies signs of infection. 03/11/2021 upon evaluation today patient appears to be doing well with regard to his wound. He does have a little bit of slough noted on the surface of the wound but fortunately there does not appear to be any signs of active infection at this time. No fevers, chills, nausea, vomiting, or diarrhea. 03/18/2021 upon evaluation today patient appears to be doing well with regard to his wound. He has been tolerating the dressing changes without complication. There was a little irritation more proximal to where the wound  was that was not noted last week but nonetheless this is very superficial just seems to be more irritation we just need to make sure to put a good amount of the zinc over the area in my opinion. Otherwise he does not seem to be doing significantly worse at all which is great news. 03/25/2021 upon evaluation today patient appears to be doing well with regard to his wound. He is going require some sharp debridement today to clear with some of the necrotic debris. I did perform this today without complication postdebridement wound bed appears to be doing much better this is great news. 04/08/2021 upon evaluation today patient appears to be doing decently well in regard to his wound although the overall measurement is not significantly smaller compared to previous. It is gone down a little bit but still the facility continues to not really put the appropriate dressings in place in fact he was supposed to have collagen we think he probably had more of an allergy to At this point. Fortunately there does not appear to be any signs of active infection systemically though locally I do not see anything on initial visualization either as far as erythema or warmth. 04/15/2021 upon evaluation today patient appears to be doing well with regard to his wound. He is actually showing signs of improvement. I did place him on antibiotics last week, Cipro . He has been taking that 2 times a day and seems to be tolerating it very well. I do not see any evidence of worsening and in fact the overall appearance of the wound is smaller today which is also great news. 9/26; left medial ankle chronic venous insufficiency wound is improved. Using Hydrofera Blue 10/10; left medial ankle chronic venous insufficiency. Wound has not changed much in appearance completely nonviable surface. Apparently there have been problems getting the right product on the wound at the facility although he came in with Hydrofera Blue on today 05/14/2021  upon evaluation today patient appears to be doing well with regard to his wound. I think he is making progress here which is good news. Fortunately there does not appear to be any signs of active infection at this time. No fevers, chills, nausea, vomiting, or diarrhea. 05/20/2021 upon evaluation today patient appears to be doing well with regard to his wound. He is showing signs of good improvement which is great news. There does not appear to be any evidence of active infection which is also excellent news. No  fevers, chills, nausea, vomiting, or diarrhea. 05/28/2021 upon evaluation today patient appears to be doing quite well. There does not appear to be any signs of active infection at this time which is great news. Overall I am extremely pleased with where things stand today. I think he is headed in the right direction. 06/11/2021 upon evaluation today patient appears to be doing well with regard to his left ankle ulcer and poorly in regard to the toe ulcer on the second toe right foot. This appears to show signs of joint exposure. Apparently this has been present for 1 to 2 months although he kept forgetting to tell me about it. That is unfortunate as right now it definitely appears to be doing significantly worse than what I would like to see. There does not appear to be any signs of active infection systemically though locally I am concerned about the possibility of infection the toe is quite red. Again no one from the facility ever contacted us  to advise that this was going on in the interim either. 06/17/2021 upon evaluation today patient presents for follow-up I did review his x-ray which showed a navicular bone fracture I am unsure of the chronicity of this. Subsequently he also had osteomyelitis of the toe which was what I was more concerned about this did not show up on x-ray but did show up on the pathology scrapings. This was listed as acute osteomyelitis. Nonetheless at this point I think  that the antibiotic treatment is the best regimen to go with currently. The patient is in agreement with that plan. Nonetheless he has initially 30 days of doxycycline off likely extend that towards the end of the treatment cycle that will be around the middle of December for an additional 2 weeks. That all depends on how well he continues to heal. Nonetheless based on what I am seeing in the foot I did want a proceed with an MRI as well which I think will be helpful to identify if there is anything else that needs to be addressed from the standpoint of infection. 06/24/2021 upon evaluation today patient appears to be doing pretty well in regards to his wounds. I think both are actually showing signs of improvement which is good I did review his MRI today which did show signs of osteomyelitis of the middle and proximal phalanx on his right foot of the affected toe. With that being said this is actually showing signs of significant improvement today already with the antibiotic therapy I think the redness is also improved. Overall I Chad [name], Chad [name] ([id number]) [id number] Page 3 of 17 think that we just need to give this some time with appropriate wound care we will see how things go potentially hyperbarics could be considered. 07/02/2021 upon inspection today patient actually appears to be doing well in regard to his left ankle which is getting very close to complete resolution of pleased in that regard. Unfortunately he is continuing to have issues with his second toe right foot and this seems to still be very painful for him. Recommend he try something different from the standpoint of antibiotics. 07/15/2021 upon evaluation today patient appears to be doing actually pretty well in regard to his foot. This is actually showing signs of significant improvement which is great news. Overall I feel like the patient is improving both in regard to the second toe as well as the  ankle on the left. With that being said the biggest issue that I do see currently is that  he is needing to have a refill of the doxycycline that we previously treated him with. He also did see podiatry they are not going to recommend any amputation at this point since he seems to be doing quite well. For that reason we just need to keep things under control from an infection standpoint. 08/01/2021 upon evaluation today patient appears to be doing well with regard to his wound. He has been tolerating the dressing changes without complication. Fortunately there does not appear to be any evidence of active infection locally nor systemically at this point. In fact I think everything is doing excellent in fact his second toe on the right foot is almost healed and the ankle on the left ankle region is actually very close to being healed as well. 08/08/2021 upon evaluation today patient appears to be doing well with regard to his wound. He has been tolerating the dressing changes without complication. Fortunately I do not see any signs of active infection at this time. Readmission: 12-06-2021 upon evaluation today patient presents for reevaluation here in the clinic he does tell me that he was being seen in facility at [facility] by a provider that was coming in. He is not sure who this was. He tells me however that the wound seems to have gotten worse even compared to where it was when we last saw him at this point. With that being said I do believe that he is likely going need ongoing wound care here in the clinic and I do believe that we need to be the ones to frontline this since his wound does seem to be getting worse not better at this point. He voiced understanding. He is also in agreement with this plan and feels more comfortable coming here she tells me. Patient's medical history really has not changed since his prior admission he was only gone since January. 12-27-2021 upon evaluation today  patient appears to be doing well with regard to his wound they did run out of the Hydrofera Blue so they did not put anything on just an ABD pad with gentamicin . Still we are seeing some signs of good improvement here with some new epithelization which is great news. 01-10-2022 upon evaluation today patient appears to be doing well with regard to his wounds and he is going require some sharp debridement but overall seems to be making good progress. Fortunately I do not see any evidence of active infection locally or systemically at this time which is great news. 01-24-2022 upon evaluation today patient appears to be doing well with regard to his wound. The facility actually came and dropped him off early and he had another appointment at the hospital and then they just brought him over here and this was still hours before his appointment this afternoon. For that reason we did do our best to work him in this morning and fortunately had some space to make this happen. With that being said patient's wound does seem to be making progress here and I am very pleased in that regard I do not see any signs of active infection locally or systemically at this time. 02-07-2022 patient appears to be doing well currently in regard to his wounds. In fact one of them the more proximal is healed the distal is still open but seems to be doing excellent. Fortunately I do not see any evidence of active infection locally or systemically at this time which is great news. No fevers, chills, nausea, vomiting, or diarrhea. 7/27;  left medial ankle venous. Improving per our intake nurse. We are using Hydrofera Blue under Tubigrip compression. They are changing that at his facility 03-06-2022 upon evaluation today patient appears to be doing well with regard to the his wound he is can require some sharp debridement but seems to be making excellent progress. Fortunately I do not see any evidence of active infection locally or  systemically which is great news. 03-27-2022 upon evaluation today patient's wound is actually showing signs of excellent improvement. Fortunately I see no signs of active infection locally or systemically at this time which is great news. No fevers, chills, nausea, vomiting, or diarrhea. 04-21-2022 upon evaluation today patient appears to be doing excellent in regard to his wound in fact this is very close to resolution based on what I am seeing. I do not see any evidence of active infection locally or systemically at this time which is great news and overall I am extremely pleased with where we are today. 05-05-2022 upon evaluation today patient's wound actually appears to potentially be completely healed. Fortunately I do not see any evidence of active infection at this time which is great news and overall I am very pleased I think this needs a little time to toughen up but other than that I really do believe were doing quite well. He is very pleased to hear this its been a long time coming. 05-19-2022 upon evaluation today patient appears to be doing well currently in regard to his wound. He in fact we were hoping will be completely healed and closed but it still has a very tiny area right in the center which is still continuing to drain. Fortunately I do not see any signs of infection locally or systemically which is great news. 11/6; this wound is close to completely he comes in this week with some erythema at 1 edge of this which looks like something was rubbing on the area. Other than that no evidence of infection 06-16-2022 upon evaluation today patient appears to be doing well currently in regard to his ankle ulcer. This is very close to being healed but still has a small area in the central portion which is not. Fortunately there does not appear to be any signs of infection locally or systemically which is great news. No fevers, chills, nausea, vomiting, or diarrhea. 06-30-2022 upon  evaluation patient's wound pretty much appears to be almost completely closed. In fact it may even be closed but there are still a lot of skin irritation around the edges of the wound and I just do not feel great about releasing them yet I would like to continue to monitor this just a little bit longer before getting to that point. He is not opposed to this and in fact is happy to continue to come in if need be in order to make sure that things are moving in the right direction he definitely does not want to backtrack. 07-14-2022 upon evaluation today patient appears to be doing well currently in regard to his wound. Has been tolerating the dressing changes without complication. Fortunately I see no evidence of active infection locally nor systemically which is great news. No fevers, chills, nausea, vomiting, or diarrhea. 08-11-2022 upon evaluation today patient appears to be doing a little worse than last time I saw him although it has been a month. Unfortunately he was not able to be seen due to the fact that he was quarantined in the facility with COVID. Subsequently he tells me that  he mention to the facility staff several times about taking it off over the past several weeks but they continue to tell him that someone have to get in touch with us  here although nobody ever did until Friday when the nurse manager called to have that documented in the communication notes. With that being said unfortunately this means that he had a wrap on that he was supposed to have on for only 1 week for ended up being on four 1 month essentially. I am glad that there is nothing worse than what we see currently to be perfectly honest. 08-19-2022 upon evaluation today patient appears to be doing well currently in regard to his wounds. He has been tolerating the dressing changes without complication. This is actually smaller today which is great news after last week's reopening. Fortunately I do not see any evidence of  infection locally nor systemically at this point. 08-26-2022 upon evaluation today patient appears to be doing well currently in regard to his wound. He has been tolerating the dressing changes without complication. Fortunately there does not appear to be any signs of active infection locally nor systemically which is great news. No fevers, chills, nausea, Chad [name], Chad [name] ([id number]) [phone].[id number] Page 4 of 17 vomiting, or diarrhea. 09-02-2022 upon evaluation today patient's wound actually showing signs of excellent improvement this is measuring smaller and looking much better. I am very pleased with where we stand I do believe that we are headed in the right direction. 09-09-2022 upon evaluation today patient appears to be doing decently well in regard to his leg in general although there was some swelling this is the 1 thing that I am not too happy with based on what I see currently. Fortunately there does not appear to be any signs of infection locally nor systemically at this point. 09-16-2022 upon evaluation today patient appears to be doing well currently in regard to his wound. He is actually showing signs of improvement he wore the Tubigrip this week and it significantly better compared to last week's evaluation. Fortunately I do not see any evidence of active infection locally or systemically which is great news. Overall I think that were headed in the right direction which is great news. In fact overall I think this is the best that he has looked in some time. He does not like the Tubigrip but actually did extremely well with this in my opinion. I think he needs to continue to utilize this. 09-23-2022 upon evaluation today patient appears to be doing well currently in regard to his wound. He has been tolerating the dressing changes without complication. Fortunately there does not appear to be any signs of active infection locally nor systemically which is great news and  in general I do feel like that we are headed in the right direction. He has been having trouble with the Tubigrip however we have gotten the facility to order compression socks he has not gotten them as of yet. 3/4; patient is about the same with a wound on the left posterior leg. This is a venous wound. He needs more compression on this leg but he took off the previous wraps we are using. I am not certain whether he is using juxta lite stockings at the nursing home. 3/12; patient presents for follow-up. He unfortunately has 2 new wounds 1 to the dorsal left side and the other to the right heel. He is not sure how these happened. The right heel appears to be caused by pressure. He  has been using Hydrofera Blue to the original wound on the left leg. 10-14-2022 upon evaluation today patient appears to be doing well currently in regard to his original wound but unfortunately he has several new wounds which I was completely unaware of before walking into the room to see him today. Unfortunately he seems to not be doing nearly as well as what I saw when I last had an appointment with him 3 weeks ago. Fortunately I do not see any signs of systemic infection though unfortunately locally he has definite infection of the left lower extremity. He is currently on doxycycline I am going to likely have to extend that today. 10-21-2022 upon evaluation today patient appears to be doing well currently in regard to his wounds. He seems to be tolerating the dressing changes without complication. Fortunately there does not appear to be any signs of active infection locally or systemically which is great news and in general I definitely feel like we are on the right track here. 10-30-2022 upon evaluation today patient still continues to not be doing too well at all with regard to his wounds. Unfortunately he is still having signs of infection as well which also has been concerned. Fortunately I do not see any signs of  active infection locally nor systemically at this time. No fevers, chills, nausea, vomiting, or diarrhea. 11-06-2022 upon evaluation today patient appears to be doing okay currently in regard to his wounds. All things considered with his blood flow not being nearly as good as what should I think that he is making pretty good progress here. Fortunately I do not see any signs of infection locally nor systemically which is great news. No fevers, chills, nausea, vomiting, or diarrhea. 11-13-2022 upon evaluation today patient appears to be doing poorly currently in regard to his wounds. Things seem to be getting a little bit worse not better. 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 Subsequently he is also going to need to have vascular intervention but we are unsure exactly when this is going on the undertaking. I would try to actually send a message to Dr. Rodolph and the vascular team just to see what exactly the plan is going forward. The patient is in agreement with that plan I just think we need to have a plan especially in light of the fact the left leg seems to be getting a little bit worse at this point. 11-25-2022 upon evaluation today patient is currently now discharged from the hospital following his colostomy procedure. That did very well and he seems to be doing well. Fortunately there does not appear to be any signs of active infection locally or systemically which is great news. No fevers, chills, nausea, vomiting, or diarrhea. 12-04-2022 upon evaluation today patient appears to be doing actually decently well in regard to his wounds and actually very pleased with where we stand and I think that he is making some pretty good progress here. Fortunately I do not see any signs of active infection locally nor systemically which is great news. No fevers, chills, nausea, vomiting, or  diarrhea. 12-12-2022 upon evaluation today patient appears to be doing well currently in regard to his wound. He has been tolerating the dressing changes without complication. The wounds all seem to be making some progress to a degree. He has appointment with vascular on the 23rd of this month. 5/24; the patient  was scheduled for a left lower extremity angiogram yesterday but apparently they were overbooked and they wanted to admit him to hospital last night so they could do this early this morning but he refused I see that Dr. Marea has rescheduled him for June 6. He is using silver alginate to wounds on his bilateral lower extremities. 01-02-2023 upon evaluation today patient appears to be doing poorly in regard to his wounds still. Subsequently he did have his angiogram but apparently there really was not much that was found that could be revascularized. The patient tells me that Dr. Marea was going to contact me here in the office I have not heard from him as of yet and I may see about giving him a call just to follow-up or possibly send a message through epic which could be an option as well. 01-09-2023 upon evaluation today patient appears to be doing poorly in regard to his legs he is extremely swollen. He has been sleeping in his chair he tells me for the past week. His bed is broken and they have ordered a new one which is supposedly supposed to be there today. With that being said he the way he needs to be not sleeping in his chair that is his wheelchair every day and through the night. This is causing his legs to be much more swollen and it seen and how badly wounds are doing at this point. This has made things significantly worse. Fortunately I do not see any signs of infection right now unfortunately I do believe that the patient has a tremendous amount of drainage currently. 01-19-2023 upon evaluation today patient appears to be doing poorly in regard to his wounds of his legs his swelling is  better but still not as good as what we would like to see. Fortunately I do not see any evidence of active infection locally nor systemically which is great news and in general I do believe that we are moving in the right direction here. Nonetheless it would be better if she was in the bed sleeping or not send up in his wheelchair although he seems to still be set up in the wheelchair at this time. He tells me that he is just not as comfortable in the bed as the wheelchair but unfortunately this is causing damage to his legs which I discussed with him today as well. Nonetheless I am not sure that he is going to be amendable to doing anything different. 01-27-2023 upon evaluation today patient appears to be doing well currently. Fortunately I do not see any evidence of active infection locally nor systemically which is great news I think he is actually doing the best option and while in fact I would try to see if we can do some debridement today to get things moving along. 02-03-2023 upon evaluation today patient's wounds unfortunately are not doing nearly as well. Will be doing the Foot locker wraps unfortunately we just not seeing the improvement that I would like to see. Fortunately I do not see any evidence of infection at this time which is good news unfortunately I do think that he needs to be changed on a daily basis due to the amount of drainage otherwise I do not think this is really going to continue to improve dramatically. 02-12-2023 upon evaluation today patient appears to be doing better in a lot of ways in regard to his wounds and although his toes on the left side actually seem a bit worse. Fortunately  I do not see any evidence of active infection locally nor systemically at this time which is good news. With that being said he is having some bleeding from the left leg I think that this is an area that looks like it probably got bumped. With that being said I discussed this with the  patient Chad [name], Chad [name] ([id number]) [id number] Page 5 of 17 he tells me he does not know of any trauma that occurred but at the same time if was not this that it was skin that got pulled off. Dressing change there is only one of the 2 that could have happened. 7/25; this is a patient who is very frail at [facility] nursing facility. He is nonambulatory. He has wounds on his bilateral lower legs. However much worse on the left we have been using silver cell to all of his wounds He comes in today and a lot more pain our intake nurse reports deterioration of the left foot. I ended up looking through some of his record. 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 I am really not certain of the stage of this currently or who is following 02-26-2023 upon evaluation today patient unfortunately appears to be doing significantly worse compared to where he was previous. The left foot is showing signs of dramatic worsening in regard to the overall appearance his toes are starting to become necrotic almost in full and he has much more cyanosis present than what I even seen previous. He is already seeing Dr. Marea and unfortunately there was not anything that they saw that was an immediate cause for this but at the same time I feel like this may have changed and even worsened I feel like this could be a vascular event and I believe this is a limb-threatening situation. 03/26/2023; I last saw this  patient on 8/1. Wondered about below-knee amputations versus palliative care. 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 The wound has healed fairly well and he follows up with surgery today. He is back today looking at wounds on the right foot and lower leg. This includes 2 areas on the right heel, right dorsal foot right dorsal first and second toes and a small area on the right anterior lower tibia area. He has a lot of pain in this leg as well. The angiogram he had on 01/01/2023 specific to the right leg now demonstrated a fairly normal common femoral artery, profunda femoris artery and superficial and popliteal arteries without significant disease. 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 No doubt this has a lot to do with the discomfort in the right leg he describes when he is in bed. They have been using Xeroform on the wounds on the right. He has a lot of swelling in this leg with weeping edema 04-09-2023 upon evaluation today patient appears to be doing poorly still in regard to his right leg. His left leg at the amputation  site is tells me he is doing quite well. Fortunately I do not see any signs of active infection locally or systemically at this time which is great news. No fevers, chills, nausea, vomiting, or diarrhea. With that being said I am concerned still about the fact that he seems to be getting worse especially in regard to the heels I think that he is still sitting up most of the day which I think is still causing some swelling of his leg unfortunately. 04-16-2023 upon evaluation today patient actually appears to be doing little bit more poorly compared to last week this actually does appear to be infected. Last week I really did not think that was the case but this seems like it may indeed be the case. Fortunately I do not see any signs  of active infection locally or systemically at this time. 04-24-2023 upon evaluation today patient appears to be doing well currently in regard to his leg compared to where we were previous I do feel like that the antibiotics have been of benefit. Fortunately I do not see any signs of active infection at this time which is great news and in general I believe that we are making headway towards healing which is great news as well. With that being said he still has a lot of drainage I really feel like something like Jace would be better for him than just an ABD pad it would likely the drainage more or even Zetuvit's but I am not sure whether we can get either 1 of these at the facility for him. 05-01-2023 upon evaluation today patient appears to be doing well currently in regard to his wounds which are showing signs of significant improvement is not any way shape or form infected like it was last time I saw him. I think the antibiotics are doing a good job but I think he may need an extension here to ensure that it completely clears. 05-08-2023 upon evaluation today patient appears to be doing well currently in regard to his wounds for the most part. He still has blue-green drainage noted but in general seems to be doing much better compared to where we were previous. Fortunately I do not see any signs of active infection locally or systemically which is great news and in general I do believe that we are making good headway towards complete closure. 05-18-2023 upon evaluation today patient appears to be doing well currently in regard to his wounds I feel like he is definitely making some improvements here. This is a slow improvement but nonetheless improvement. I do believe that he is doing quite well as far as infection is concerned which is also good news. 05-25-2023 upon evaluation today patient actually appears to be doing worse in regard to his legs at this point. Unfortunately he continues to  have issues with significant drainage which is the primary concern here. I do feel like that the wound currently is not doing a whole lot better when he was on the Levaquin  I felt like it was improving some but the concern there is QT prolongation I do not want to continue that personally further I think he needs a referral ASAP to infectious disease. I discussed that with him today. He also needs to be having this changed every day and that has not been happening at this point. That is my biggest concern here. 06-09-2023 upon evaluation today patient appears to be doing still poorly in regard to this right leg which is draining  profusely. This is unfortunately just continuing to show signs of being very moist and wet and weeping quite significantly. This is despite everything that we are doing including the facility have been placed him on Levaquin  by ID which unfortunately just does not seem to be helping to heal this as well as we would hope either. I do have an infectious disease appointment that we sent 10 for him and that is actually scheduled for next week on the 19th. 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 06-17-2023 upon evaluation today patient appears to be doing still poorly in regard to his legs at this point. Unfortunately he still seems to be having issues with significant drainage we are having this changed every single day. With that being said despite this the patient just does not seem to be making a lot of headway here towards closure. We are trying to do what we can from the standpoint of compression as well. He did see infectious disease yesterday. I did review that note as well. Upon evaluation the note actually states that the patient told the physician do I have to keep suffering with wounds forevero Is there a way to get rid of these woundso However he did not want a  referral to see Dr. Harden for potential amputation which seems to be 1 option that was proposed. With that being said he also apparently told her that he was adamant about not doing another course of antibiotics as he feels like they have not healed his wounds. It appears based on evaluation with regard to the record that the patient did have a discussion with her about the possibility of doing a short course of IV cefepime  for 14 days to cover Serratia as well as the other bacteria noted on culture. However the patient declined this as he felt like the antibiotics in the past he had taken did not help at all and there were no signs and symptoms to suggest sepsis. Therefore she recommended continuing wound care and to continue to follow-up with vascular follow-up in 2 weeks with her. I appreciate this evaluation and again I cannot do anything with the fact that the patient did not want to proceed with any further treatment with regard to the bacterial infection. 06-23-2023 the patient seems to making some progress here in regard to his wound. I feel like it is still a tremendous amount of drainage but at the same time I feel like you are also showing some signs of significant improvement which is great news and very pleased with where things stand currently. Especially compared to where we have been. 07-07-2023 upon evaluation today patient appears to be doing about the same currently in regard to his wounds. He does not seem quite as erythematous as he has been around the wounds but the wounds continue to get larger due to the swelling and the drainage. Again I think the moisture is causing additional and further breakdown which is unfortunate. With that being said I discussed with the patient that I do believe that the only way to get this better is going to be to keep the swelling under control and the drainage off of his skin. This is the hardest part to be sure. I think the alginate dressing is  being used at the facility is SHRAVAN, SALAHUDDIN ([id number]) [id number] Page 6 of 17 actually keeping things more  moist which is not ideal. 07-15-2023 upon evaluation today patient appears to be doing poorly still in regard to his wounds he still continues to have significant issues. Fortunately I do not see any signs of active infection which is great news and in general I am pleased in that regard but at the same time I do believe that he is not doing quite as well as he could be especially in light of the fact that he still did put the alginate on which is gets very wet and stays completely soaked on the wounds on trying to get this off and just use the XtraSorb which would think be much better. 12/31; this is a patient with combined severe PAD and venous insufficiency. He has very substantial wound areas involving the left lower leg especially medially left dorsal foot. Weeping edema fluid. We have been using silver alginate Xtrasorb Electronic Signature(s) Signed: 07/28/2023 4:49:51 PM By: Rufus Sharper MD Entered By: Rufus Sharper on 07/28/2023 12:10:20 -------------------------------------------------------------------------------- Physical Exam Details Patient Name: Date of Service: Chad [name] Chad [name] 07/28/2023 10:15 A M Medical Record Number: [id number] Patient Account Number: [id number] Date of Birth/Sex: Treating RN: 30-Jun-1961 (63 y.o. [name] Primary Care Provider: Gerlean Maier Other Clinician: Referring Provider: Treating Provider/Extender: RO BSO LOISE, Chad [name] Gerlean Maier Weeks in Treatment: 86 Constitutional Patient is hypertensive. appears well. Musculoskeletal . Notes Left BKA. Site is healed well. Wound exam; unfortunately substantial wound area on the right medial right posterior lower leg with some areas in the dorsal foot on the right as well. There is edema in the right lower leg weeping edema fluid. No overt evidence of  infection Electronic Signature(s) Signed: 07/28/2023 4:49:51 PM By: Rufus Sharper MD Entered By: Rufus Sharper on 07/28/2023 12:12:22 -------------------------------------------------------------------------------- Physician Orders Details Patient Name: Date of Service: Chad [name], Chad [name] 07/28/2023 10:15 A M Medical Record Number: [id number] Patient Account Number: [id number] Date of Birth/Sex: Treating RN: July 26, 1961 (63 y.o. [name] Primary Care Provider: Gerlean Maier Other Clinician: Referring Provider: Treating Provider/Extender: RO BSO LOISE, Chad [name] Gerlean Maier Devra in Treatment: 38 Verbal / Phone Orders: No LAKODA, MCANANY ([id number]) [id number] Page 7 of 17 Diagnosis Coding Follow-up Appointments Return Appointment in 2 weeks. Nurse Visit as needed Bathing/ [employer] wounds with antibacterial soap and water . - LEAVE OFF THE FOAM DRESSING. COVER WOUNDS WITH ABD PADS (DOUBLED IF NEEDED) AND WRAP WITH KERLIX. PUT TUBIGRIP OVER KERLIX. CHANGE DAILY No tub bath. Anesthetic (Use 'Patient Medications' Section for Anesthetic Order Entry) Lidocaine  applied to wound bed Off-Loading Heel suspension boot - when in bed Turn and reposition every 2 hours Wound Treatment Wound #16 - Calcaneus Wound Laterality: Right Cleanser: Soap and Water  1 x Per Day/30 Days Discharge Instructions: Gently cleanse wound with antibacterial soap, rinse and pat dry prior to dressing wounds Cleanser: Wound Cleanser 1 x Per Day/30 Days Discharge Instructions: Wash your hands with soap and water . Remove old dressing, discard into plastic bag and place into trash. Cleanse the wound with Wound Cleanser prior to applying a clean dressing using gauze sponges, not tissues or cotton balls. Do not scrub or use excessive force. Pat dry using gauze sponges, not tissue or cotton balls. Secondary Dressing: ABD Pad 5x9 (in/in) 1 x Per Day/30 Days Discharge  Instructions: Cover with ABD pad Secondary Dressing: Heel Cup 1 x Per Day/30 Days Secured With: Kerlix Roll Sterile or Non-Sterile 6-ply 4.5x4 (yd/yd) 1 x Per Day/30 Days Discharge Instructions: Apply Kerlix as directed Secured With:  Tubigrip Size D, 3x10 (in/yd) 1 x Per Day/30 Days Wound #18 - T Second oe Wound Laterality: Right, Medial Cleanser: Soap and Water  1 x Per Day/30 Days Discharge Instructions: Gently cleanse wound with antibacterial soap, rinse and pat dry prior to dressing wounds Cleanser: Wound Cleanser 1 x Per Day/30 Days Discharge Instructions: Wash your hands with soap and water . Remove old dressing, discard into plastic bag and place into trash. Cleanse the wound with Wound Cleanser prior to applying a clean dressing using gauze sponges, not tissues or cotton balls. Do not scrub or use excessive force. Pat dry using gauze sponges, not tissue or cotton balls. Secondary Dressing: ABD Pad 5x9 (in/in) 1 x Per Day/30 Days Discharge Instructions: Cover with ABD pad Secondary Dressing: Heel Cup 1 x Per Day/30 Days Secured With: Kerlix Roll Sterile or Non-Sterile 6-ply 4.5x4 (yd/yd) 1 x Per Day/30 Days Discharge Instructions: Apply Kerlix as directed Secured With: Tubigrip Size D, 3x10 (in/yd) 1 x Per Day/30 Days Wound #21 - Foot Wound Laterality: Dorsal, Right Cleanser: Soap and Water  1 x Per Day/30 Days Discharge Instructions: Gently cleanse wound with antibacterial soap, rinse and pat dry prior to dressing wounds Cleanser: Wound Cleanser 1 x Per Day/30 Days Discharge Instructions: Wash your hands with soap and water . Remove old dressing, discard into plastic bag and place into trash. Cleanse the wound with Wound Cleanser prior to applying a clean dressing using gauze sponges, not tissues or cotton balls. Do not scrub or use excessive force. Pat dry using gauze sponges, not tissue or cotton balls. Secondary Dressing: ABD Pad 5x9 (in/in) 1 x Per Day/30 Days Discharge  Instructions: Cover with ABD pad Secondary Dressing: Heel Cup 1 x Per Day/30 Days Secured With: Kerlix Roll Sterile or Non-Sterile 6-ply 4.5x4 (yd/yd) 1 x Per Day/30 Days Discharge Instructions: Apply Kerlix as directed Secured With: Tubigrip Size D, 3x10 (in/yd) 1 x Per Day/30 Days Wound #22 - Lower Leg Wound Laterality: Right, Anterior Chad [name] ([id number]) [id number] Page 8 of 17 Cleanser: Soap and Water  1 x Per Day/30 Days Discharge Instructions: Gently cleanse wound with antibacterial soap, rinse and pat dry prior to dressing wounds Cleanser: Wound Cleanser 1 x Per Day/30 Days Discharge Instructions: Wash your hands with soap and water . Remove old dressing, discard into plastic bag and place into trash. Cleanse the wound with Wound Cleanser prior to applying a clean dressing using gauze sponges, not tissues or cotton balls. Do not scrub or use excessive force. Pat dry using gauze sponges, not tissue or cotton balls. Secondary Dressing: ABD Pad 5x9 (in/in) 1 x Per Day/30 Days Discharge Instructions: Cover with ABD pad Secondary Dressing: Heel Cup 1 x Per Day/30 Days Secured With: Kerlix Roll Sterile or Non-Sterile 6-ply 4.5x4 (yd/yd) 1 x Per Day/30 Days Discharge Instructions: Apply Kerlix as directed Secured With: Tubigrip Size D, 3x10 (in/yd) 1 x Per Day/30 Days Wound #23 - Lower Leg Wound Laterality: Right, Posterior Cleanser: Soap and Water  1 x Per Day/30 Days Discharge Instructions: Gently cleanse wound with antibacterial soap, rinse and pat dry prior to dressing wounds Cleanser: Wound Cleanser 1 x Per Day/30 Days Discharge Instructions: Wash your hands with soap and water . Remove old dressing, discard into plastic bag and place into trash. Cleanse the wound with Wound Cleanser prior to applying a clean dressing using gauze sponges, not tissues or cotton balls. Do not scrub or use excessive force. Pat dry using gauze sponges, not tissue or cotton  balls. Secondary Dressing: ABD Pad 5x9 (in/in) 1  x Per Day/30 Days Discharge Instructions: Cover with ABD pad Secondary Dressing: Heel Cup 1 x Per Day/30 Days Secured With: Kerlix Roll Sterile or Non-Sterile 6-ply 4.5x4 (yd/yd) 1 x Per Day/30 Days Discharge Instructions: Apply Kerlix as directed Secured With: Tubigrip Size D, 3x10 (in/yd) 1 x Per Day/30 Days Electronic Signature(s) Signed: 07/28/2023 4:49:51 PM By: Rufus Sharper MD Signed: 07/28/2023 5:14:10 PM By: [name] MSN RN CNS WTA Entered By: [name] on 07/28/2023 11:34:31 -------------------------------------------------------------------------------- Problem List Details Patient Name: Date of Service: Chad [name], Chad [name] 07/28/2023 10:15 A M Medical Record Number: [id number] Patient Account Number: [id number] Date of Birth/Sex: Treating RN: 1961/04/04 (63 y.o. [name] Primary Care Provider: Gerlean Maier Other Clinician: Referring Provider: Treating Provider/Extender: Chad [name], Chad [name] Gerlean Maier Weeks in Treatment: 29 Active Problems ICD-10 Encounter Code Description Active Date MDM Diagnosis 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 (idiopathic) with ulcer and inflammation of left 12/06/2021 No Yes lower extremity HEINRICH, FERTIG ([id number]) [id number] Page 9 of 17 L98.492 Non-pressure chronic ulcer of skin of other sites with fat layer exposed 08/11/2022 No Yes L97.518 Non-pressure chronic ulcer of other part of right foot with other specified 03/26/2023 No Yes severity 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 02/19/2023 No Yes foot 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, stage 3 10/07/2022 No Yes Inactive Problems ICD-10 Code Description Active Date Inactive Date L97.322 Non-pressure chronic ulcer of left ankle with fat layer exposed 12/06/2021 12/06/2021 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 [name] Primary Care Provider: Gerlean Maier Other Clinician: Referring Provider: Treating Provider/Extender: Chad [name], Chad [name] Gerlean Maier Weeks in Treatment: 66 Subjective History of Present Illness (HPI) 10/08/18 on evaluation today patient actually presents to our office for initial evaluation concerning wounds that he has of the bilateral lower extremities. 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 He also has bilateral venous stasis. He apparently has been homeless currently following discharge from the hospital apparently he has been placed at [facility] which is is a skilled nursing facility locally. Nonetheless fortunately he does not show any signs of infection at this time which is good news. In fact several of the wound actually appears to be showing some signs of improvement already in my pinion. There are a couple areas in the left leg in particular there likely gonna require some sharp debridement to help clear away some necrotic tissue and help with more sufficient healing. No fevers, chills, nausea, or vomiting noted at this time. 10/15/18 on evaluation today patient actually appears to be doing very well in regard to his bilateral lower extremities. He's been tolerating the dressing changes without complication. Fortunately  there does not appear to be any evidence of active infection at this time which is great news. Overall I'm actually very pleased with how this has progressed in just one visits time. Readmission: SHADEED, COLBERG ([id number]) [id number] Page 10 of 17 08/14/2020 upon evaluation today patient presents for re-evaluation here in  our clinic. He is having issues with his left ankle region as well as his right toe and his right heel. He tells me that the toe and heel actually began as a area that was itching that he was scratching and then subsequently opened up into wounds. These may have been abscess areas I presume based on what I am seeing currently. With regard to his left ankle region he tells me this was a similar type occurrence although he does have venous stasis this very well may be more of a venous leg ulcer more than anything. Nonetheless I do believe that the patient would benefit from appropriate and aggressive wound care to try to help get things under better control here. He does have history of a stroke on the left side affecting him to some degree there that he is able to stand although he does have some residual weakness. Otherwise again the patient does have chronic venous insufficiency as previously noted. His arterial studies most recently obtained showed that he had an ABI on the right of 1.16 with a TBI of 0.52 and on the left and ABI of 1.14 with a TBI of 0.81. That was obtained on 06/19/2020. 08/28/2020 upon evaluation today patient appears to be doing decently well in regard to his wounds in general. He has been tolerating the dressing changes without complication. Fortunately there does not appear to be any signs of active infection which is great news. With that being said I think the Hydrofera Blue is doing a good job I would recommend that we likely continue with that currently. 09/11/2020 upon evaluation today patient's wounds did not appear to be  doing too poorly but again he is not really showing signs of significant improvement with regard to any of the wounds on the right. None of them have Hydrofera Blue on them I am not exactly sure why this is not being followed as the facility did not contact us  to let us  know of any issues with obtaining dressings or otherwise. With that being said he is supposed to be using Hydrofera Blue on both of the wounds on the right foot as well as the ankle wound on the left side. 09/18/2020 upon evaluation today patient appears to be doing poorly with regard to his wounds. Again right now the left ankle in particular showed signs of extreme maceration. Apparently he was told by someone with staff at [facility] they could not get the Hydrofera Blue. With that being said this is something that is never been relayed to us  one way or another. Also the patient subsequently has not supposed to have a border gauze dressing on. He should have an ABD pad and roll gauze to secure as this drains much too much just to have a border gauze dressing to cover. Nonetheless the fact that they are not using the appropriate dressing is directly causing deterioration of the left ankle wound it is significantly worse today compared to what it was previous. I did attempt to call [facility] while the patient was here I called three times and got no one to even pick up the phone. After this I had my for an office coordinator call and she was able to finally get through and leave a message with the D ON as of dictation of this note which is roughly about an hour and a half later I still have not been able to speak with anyone at the facility. 09/25/2020 upon evaluation today patient actually  showing signs of good improvement which is excellent news. He has been tolerating the dressing changes without complication. Fortunately there is no signs of active infection which is great news. No fevers, chills, nausea, vomiting,  or diarrhea. I do feel like the facility has been doing a much better job at taking care of him as far as the dressings are concerned. However the director of nursing never did call me back. 10/09/2020 upon evaluation today patient appears to be doing well with regard to his wound. The toe ulcer did require some debridement but the other 2 areas actually appear to be doing quite well. 10/19/2020 upon evaluation today patient actually appears to be doing very well in regard to his wounds. In fact the heel does appear to be completely healed. The toe is doing better in the medial ankle on the left is also doing better. Overall I think he is headed in the right direction. 10/26/2020 upon evaluation today patient appears to be doing well with regard to his wound. He is showing signs of improvement which is great news and overall I am very pleased with where things stand today. No fevers, chills, nausea, vomiting, or diarrhea. 11/02/2020 upon evaluation today patient appears to be doing well with regard to his wounds. He has been tolerating the dressing changes without complication overall I am extremely pleased with where things stand today. He in regard to the toe is almost completely healed and the medial ankle on the left is doing much better. 11/09/2020 upon evaluation today patient appears to be doing a little poorly in regard to his left medial ankle ulcer. Fortunately there does not appear to be any signs of systemic infection but unfortunately locally he does appear to be infected in fact he has blue-green drainage consistent with Pseudomonas. 11/16/2020 upon evaluation today patient appears to be doing well with regard to his wound. It actually appears to be doing better. I did place him on gentamicin  cream since the Cipro  was actually resistant even though he was positive for Pseudomonas on culture. Overall I think that he does seem to be doing better though I am unsure whether or not they have actually  been putting the cream on. The patient is not sure that we did talk to the nurse directly and she was going to initiate that treatment. Fortunately there does not appear to be any signs of active infection at this time. No fevers, chills, nausea, vomiting, or diarrhea. 4/28; the area on the right second toe is close to healed. Left medial ankle required debridement 12/07/2020 upon evaluation today patient appears to be doing well with regard to his wounds. In fact the right second toe appears to be completely healed which is great news. Fortunately there does not appear to be any signs of active infection at this time which is also great news. I think we can probably discontinue the gentamicin  on top of everything else. 12/14/2020 upon evaluation today patient appears to be doing well with regard to his wound. He is making good progress and overall very pleased with where things stand today. There is no signs of active infection at this time which is great news. 12/28/2020 upon evaluation today patient appears to be doing well with regard to his wounds. He has been tolerating the dressing changes without complication. Fortunately there is no signs of active infection at this time. No fevers, chills, nausea, vomiting, or diarrhea. 12/28/2020 upon evaluation today patient's wound bed actually showed signs of excellent improvement.  He has great epithelization and granulation I do not see any signs of infection overall I am extremely pleased with where things stand at this point. No fevers, chills, nausea, vomiting, or diarrhea. 01/11/2021 upon evaluation today patient appears to be doing well with regard to his wound on his leg. He has been tolerating the dressing changes without complication. Fortunately there does not appear to be any signs of active infection which is great news. No fevers, chills, nausea, vomiting, or diarrhea. 01/25/2021 upon evaluation today patient appears to be doing well with regard to  his wound. He has been tolerating the dressing changes without complication. Fortunately the collagen seems to be doing a great job which is excellent news. No fevers, chills, nausea, vomiting, or diarrhea. 02/08/2021 upon evaluation today patient's wound is actually looking a little bit worse especially in the periwound compared to previous. Fortunately there does not appear to be any signs of infection which is great news with that being said he does have some irritation around the periphery of the wound which has me more concerned. He actually had a dressing on that had not been changed in 3 days. He also is supposed to have daily dressing changes. With regard to the dressing applied he had a silver alginate dressing and silver collagen is what is recommended and ordered. He also had no Desitin around the edges of the wound in the periwound region although that is on the order inspect to be done as well. In general I was very concerned I did contact [facility] actually spoke with Sonny who is the scheduling individual and subsequently she stated that she would pass the information to the D ON apparently the D ON was not available to talk to me when I call today. 02/18/2021 upon evaluation today patient's wound is actually showing signs of improvement. Fortunately there does not appear to be any evidence of infection which is great news overall I am extremely pleased with where things stand today. No fevers, chills, nausea, vomiting, or diarrhea. 8/3; patient presents for 1 week follow-up. He has no issues or complaints today. He denies signs of infection. 03/11/2021 upon evaluation today patient appears to be doing well with regard to his wound. He does have a little bit of slough noted on the surface of the wound but fortunately there does not appear to be any signs of active infection at this time. No fevers, chills, nausea, vomiting, or diarrhea. 03/18/2021 upon evaluation today patient  appears to be doing well with regard to his wound. He has been tolerating the dressing changes without complication. There was a little irritation more proximal to where the wound was that was not noted last week but nonetheless this is very superficial just seems to be more [facility], Chad [name] ([id number]) [id number] Page 11 of 17 irritation we just need to make sure to put a good amount of the zinc over the area in my opinion. Otherwise he does not seem to be doing significantly worse at all which is great news. 03/25/2021 upon evaluation today patient appears to be doing well with regard to his wound. He is going require some sharp debridement today to clear with some of the necrotic debris. I did perform this today without complication postdebridement wound bed appears to be doing much better this is great news. 04/08/2021 upon evaluation today patient appears to be doing decently well in regard to his wound although the overall measurement is not significantly smaller compared to previous. It is gone  down a little bit but still the facility continues to not really put the appropriate dressings in place in fact he was supposed to have collagen we think he probably had more of an allergy to At this point. Fortunately there does not appear to be any signs of active infection systemically though locally I do not see anything on initial visualization either as far as erythema or warmth. 04/15/2021 upon evaluation today patient appears to be doing well with regard to his wound. He is actually showing signs of improvement. I did place him on antibiotics last week, Cipro . He has been taking that 2 times a day and seems to be tolerating it very well. I do not see any evidence of worsening and in fact the overall appearance of the wound is smaller today which is also great news. 9/26; left medial ankle chronic venous insufficiency wound is improved. Using Hydrofera Blue 10/10; left  medial ankle chronic venous insufficiency. Wound has not changed much in appearance completely nonviable surface. Apparently there have been problems getting the right product on the wound at the facility although he came in with Hydrofera Blue on today 05/14/2021 upon evaluation today patient appears to be doing well with regard to his wound. I think he is making progress here which is good news. Fortunately there does not appear to be any signs of active infection at this time. No fevers, chills, nausea, vomiting, or diarrhea. 05/20/2021 upon evaluation today patient appears to be doing well with regard to his wound. He is showing signs of good improvement which is great news. There does not appear to be any evidence of active infection which is also excellent news. No fevers, chills, nausea, vomiting, or diarrhea. 05/28/2021 upon evaluation today patient appears to be doing quite well. There does not appear to be any signs of active infection at this time which is great news. Overall I am extremely pleased with where things stand today. I think he is headed in the right direction. 06/11/2021 upon evaluation today patient appears to be doing well with regard to his left ankle ulcer and poorly in regard to the toe ulcer on the second toe right foot. This appears to show signs of joint exposure. Apparently this has been present for 1 to 2 months although he kept forgetting to tell me about it. That is unfortunate as right now it definitely appears to be doing significantly worse than what I would like to see. There does not appear to be any signs of active infection systemically though locally I am concerned about the possibility of infection the toe is quite red. Again no one from the facility ever contacted us  to advise that this was going on in the interim either. 06/17/2021 upon evaluation today patient presents for follow-up I did review his x-ray which showed a navicular bone fracture I am unsure  of the chronicity of this. Subsequently he also had osteomyelitis of the toe which was what I was more concerned about this did not show up on x-ray but did show up on the pathology scrapings. This was listed as acute osteomyelitis. Nonetheless at this point I think that the antibiotic treatment is the best regimen to go with currently. The patient is in agreement with that plan. Nonetheless he has initially 30 days of doxycycline off likely extend that towards the end of the treatment cycle that will be around the middle of December for an additional 2 weeks. That all depends on how well he continues to  heal. Nonetheless based on what I am seeing in the foot I did want a proceed with an MRI as well which I think will be helpful to identify if there is anything else that needs to be addressed from the standpoint of infection. 06/24/2021 upon evaluation today patient appears to be doing pretty well in regards to his wounds. I think both are actually showing signs of improvement which is good I did review his MRI today which did show signs of osteomyelitis of the middle and proximal phalanx on his right foot of the affected toe. With that being said this is actually showing signs of significant improvement today already with the antibiotic therapy I think the redness is also improved. Overall I think that we just need to give this some time with appropriate wound care we will see how things go potentially hyperbarics could be considered. 07/02/2021 upon inspection today patient actually appears to be doing well in regard to his left ankle which is getting very close to complete resolution of pleased in that regard. Unfortunately he is continuing to have issues with his second toe right foot and this seems to still be very painful for him. Recommend he try something different from the standpoint of antibiotics. 07/15/2021 upon evaluation today patient appears to be doing actually pretty well in regard to  his foot. This is actually showing signs of significant improvement which is great news. Overall I feel like the patient is improving both in regard to the second toe as well as the ankle on the left. With that being said the biggest issue that I do see currently is that he is needing to have a refill of the doxycycline that we previously treated him with. He also did see podiatry they are not going to recommend any amputation at this point since he seems to be doing quite well. For that reason we just need to keep things under control from an infection standpoint. 08/01/2021 upon evaluation today patient appears to be doing well with regard to his wound. He has been tolerating the dressing changes without complication. Fortunately there does not appear to be any evidence of active infection locally nor systemically at this point. In fact I think everything is doing excellent in fact his second toe on the right foot is almost healed and the ankle on the left ankle region is actually very close to being healed as well. 08/08/2021 upon evaluation today patient appears to be doing well with regard to his wound. He has been tolerating the dressing changes without complication. Fortunately I do not see any signs of active infection at this time. Readmission: 12-06-2021 upon evaluation today patient presents for reevaluation here in the clinic he does tell me that he was being seen in facility at [facility] by a provider that was coming in. He is not sure who this was. He tells me however that the wound seems to have gotten worse even compared to where it was when we last saw him at this point. With that being said I do believe that he is likely going need ongoing wound care here in the clinic and I do believe that we need to be the ones to frontline this since his wound does seem to be getting worse not better at this point. He voiced understanding. He is also in agreement with this plan and feels  more comfortable coming here she tells me. Patient's medical history really has not changed since his prior admission he was  only gone since January. 12-27-2021 upon evaluation today patient appears to be doing well with regard to his wound they did run out of the Hydrofera Blue so they did not put anything on just an ABD pad with gentamicin . Still we are seeing some signs of good improvement here with some new epithelization which is great news. 01-10-2022 upon evaluation today patient appears to be doing well with regard to his wounds and he is going require some sharp debridement but overall seems to be making good progress. Fortunately I do not see any evidence of active infection locally or systemically at this time which is great news. 01-24-2022 upon evaluation today patient appears to be doing well with regard to his wound. The facility actually came and dropped him off early and he had another appointment at the hospital and then they just brought him over here and this was still hours before his appointment this afternoon. For that reason we did do our best to work him in this morning and fortunately had some space to make this happen. With that being said patient's wound does seem to be making progress here and I am very pleased in that regard I do not see any signs of active infection locally or systemically at this time. 02-07-2022 patient appears to be doing well currently in regard to his wounds. In fact one of them the more proximal is healed the distal is still open but seems to be doing excellent. Fortunately I do not see any evidence of active infection locally or systemically at this time which is great news. No fevers, chills, nausea, vomiting, or diarrhea. 7/27; left medial ankle venous. Improving per our intake nurse. We are using Hydrofera Blue under Tubigrip compression. They are changing that at his facility 03-06-2022 upon evaluation today patient appears to be doing well with  regard to the his wound he is can require some sharp debridement but seems to be MACKIE, HOLNESS ([id number]) [id number] Page 12 of 17 making excellent progress. Fortunately I do not see any evidence of active infection locally or systemically which is great news. 03-27-2022 upon evaluation today patient's wound is actually showing signs of excellent improvement. Fortunately I see no signs of active infection locally or systemically at this time which is great news. No fevers, chills, nausea, vomiting, or diarrhea. 04-21-2022 upon evaluation today patient appears to be doing excellent in regard to his wound in fact this is very close to resolution based on what I am seeing. I do not see any evidence of active infection locally or systemically at this time which is great news and overall I am extremely pleased with where we are today. 05-05-2022 upon evaluation today patient's wound actually appears to potentially be completely healed. Fortunately I do not see any evidence of active infection at this time which is great news and overall I am very pleased I think this needs a little time to toughen up but other than that I really do believe were doing quite well. He is very pleased to hear this its been a long time coming. 05-19-2022 upon evaluation today patient appears to be doing well currently in regard to his wound. He in fact we were hoping will be completely healed and closed but it still has a very tiny area right in the center which is still continuing to drain. Fortunately I do not see any signs of infection locally or systemically which is great news. 11/6; this wound is close to completely he comes in  this week with some erythema at 1 edge of this which looks like something was rubbing on the area. Other than that no evidence of infection 06-16-2022 upon evaluation today patient appears to be doing well currently in regard to his ankle ulcer. This is very close to  being healed but still has a small area in the central portion which is not. Fortunately there does not appear to be any signs of infection locally or systemically which is great news. No fevers, chills, nausea, vomiting, or diarrhea. 06-30-2022 upon evaluation patient's wound pretty much appears to be almost completely closed. In fact it may even be closed but there are still a lot of skin irritation around the edges of the wound and I just do not feel great about releasing them yet I would like to continue to monitor this just a little bit longer before getting to that point. He is not opposed to this and in fact is happy to continue to come in if need be in order to make sure that things are moving in the right direction he definitely does not want to backtrack. 07-14-2022 upon evaluation today patient appears to be doing well currently in regard to his wound. Has been tolerating the dressing changes without complication. Fortunately I see no evidence of active infection locally nor systemically which is great news. No fevers, chills, nausea, vomiting, or diarrhea. 08-11-2022 upon evaluation today patient appears to be doing a little worse than last time I saw him although it has been a month. Unfortunately he was not able to be seen due to the fact that he was quarantined in the facility with COVID. Subsequently he tells me that he mention to the facility staff several times about taking it off over the past several weeks but they continue to tell him that someone have to get in touch with us  here although nobody ever did until Friday when the nurse manager called to have that documented in the communication notes. With that being said unfortunately this means that he had a wrap on that he was supposed to have on for only 1 week for ended up being on four 1 month essentially. I am glad that there is nothing worse than what we see currently to be perfectly honest. 08-19-2022 upon evaluation today  patient appears to be doing well currently in regard to his wounds. He has been tolerating the dressing changes without complication. This is actually smaller today which is great news after last week's reopening. Fortunately I do not see any evidence of infection locally nor systemically at this point. 08-26-2022 upon evaluation today patient appears to be doing well currently in regard to his wound. He has been tolerating the dressing changes without complication. Fortunately there does not appear to be any signs of active infection locally nor systemically which is great news. No fevers, chills, nausea, vomiting, or diarrhea. 09-02-2022 upon evaluation today patient's wound actually showing signs of excellent improvement this is measuring smaller and looking much better. I am very pleased with where we stand I do believe that we are headed in the right direction. 09-09-2022 upon evaluation today patient appears to be doing decently well in regard to his leg in general although there was some swelling this is the 1 thing that I am not too happy with based on what I see currently. Fortunately there does not appear to be any signs of infection locally nor systemically at this point. 09-16-2022 upon evaluation today patient appears to be  doing well currently in regard to his wound. He is actually showing signs of improvement he wore the Tubigrip this week and it significantly better compared to last week's evaluation. Fortunately I do not see any evidence of active infection locally or systemically which is great news. Overall I think that were headed in the right direction which is great news. In fact overall I think this is the best that he has looked in some time. He does not like the Tubigrip but actually did extremely well with this in my opinion. I think he needs to continue to utilize this. 09-23-2022 upon evaluation today patient appears to be doing well currently in regard to his wound. He has been  tolerating the dressing changes without complication. Fortunately there does not appear to be any signs of active infection locally nor systemically which is great news and in general I do feel like that we are headed in the right direction. He has been having trouble with the Tubigrip however we have gotten the facility to order compression socks he has not gotten them as of yet. 3/4; patient is about the same with a wound on the left posterior leg. This is a venous wound. He needs more compression on this leg but he took off the previous wraps we are using. I am not certain whether he is using juxta lite stockings at the nursing home. 3/12; patient presents for follow-up. He unfortunately has 2 new wounds 1 to the dorsal left side and the other to the right heel. He is not sure how these happened. The right heel appears to be caused by pressure. He has been using Hydrofera Blue to the original wound on the left leg. 10-14-2022 upon evaluation today patient appears to be doing well currently in regard to his original wound but unfortunately he has several new wounds which I was completely unaware of before walking into the room to see him today. Unfortunately he seems to not be doing nearly as well as what I saw when I last had an appointment with him 3 weeks ago. Fortunately I do not see any signs of systemic infection though unfortunately locally he has definite infection of the left lower extremity. He is currently on doxycycline I am going to likely have to extend that today. 10-21-2022 upon evaluation today patient appears to be doing well currently in regard to his wounds. He seems to be tolerating the dressing changes without complication. Fortunately there does not appear to be any signs of active infection locally or systemically which is great news and in general I definitely feel like we are on the right track here. 10-30-2022 upon evaluation today patient still continues to not be doing too  well at all with regard to his wounds. Unfortunately he is still having signs of infection as well which also has been concerned. Fortunately I do not see any signs of active infection locally nor systemically at this time. No fevers, chills, nausea, vomiting, or diarrhea. 11-06-2022 upon evaluation today patient appears to be doing okay currently in regard to his wounds. All things considered with his blood flow not being nearly as good as what should I think that he is making pretty good progress here. Fortunately I do not see any signs of infection locally nor systemically which is great news. No fevers, chills, nausea, vomiting, or diarrhea. 11-13-2022 upon evaluation today patient appears to be doing poorly currently in regard to his wounds. Things seem to be getting a little bit  worse not better. 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 Subsequently he is also going to need to have vascular intervention but we are unsure exactly when this is going on the undertaking. I would try to actually send a message to Dr. MARICE, Chad [name] ([id number]) [id number] Page 13 of 17 C[place]Diaz and the vascular team just to see what exactly the plan is going forward. The patient is in agreement with that plan I just think we need to have a plan especially in light of the fact the left leg seems to be getting a little bit worse at this point. 11-25-2022 upon evaluation today patient is currently now discharged from the hospital following his colostomy procedure. That did very well and he seems to be doing well. Fortunately there does not appear to be any signs of active infection locally or systemically which is great news. No fevers, chills, nausea, vomiting, or diarrhea. 12-04-2022 upon evaluation today patient appears to be doing actually decently well in regard to his wounds and actually very  pleased with where we stand and I think that he is making some pretty good progress here. Fortunately I do not see any signs of active infection locally nor systemically which is great news. No fevers, chills, nausea, vomiting, or diarrhea. 12-12-2022 upon evaluation today patient appears to be doing well currently in regard to his wound. He has been tolerating the dressing changes without complication. The wounds all seem to be making some progress to a degree. He has appointment with vascular on the 23rd of this month. 5/24; the patient was scheduled for a left lower extremity angiogram yesterday but apparently they were overbooked and they wanted to admit him to hospital last night so they could do this early this morning but he refused I see that Dr. Marea has rescheduled him for June 6. He is using silver alginate to wounds on his bilateral lower extremities. 01-02-2023 upon evaluation today patient appears to be doing poorly in regard to his wounds still. Subsequently he did have his angiogram but apparently there really was not much that was found that could be revascularized. The patient tells me that Dr. Marea was going to contact me here in the office I have not heard from him as of yet and I may see about giving him a call just to follow-up or possibly send a message through epic which could be an option as well. 01-09-2023 upon evaluation today patient appears to be doing poorly in regard to his legs he is extremely swollen. He has been sleeping in his chair he tells me for the past week. His bed is broken and they have ordered a new one which is supposedly supposed to be there today. With that being said he the way he needs to be not sleeping in his chair that is his wheelchair every day and through the night. This is causing his legs to be much more swollen and it seen and how badly wounds are doing at this point. This has made things significantly worse. Fortunately I do not see any signs of  infection right now unfortunately I do believe that the patient has a tremendous amount of drainage currently. 01-19-2023 upon evaluation today patient appears to be doing poorly in regard to his wounds of his legs his swelling is better but still not as good as what we would like to see. Fortunately  I do not see any evidence of active infection locally nor systemically which is great news and in general I do believe that we are moving in the right direction here. Nonetheless it would be better if she was in the bed sleeping or not send up in his wheelchair although he seems to still be set up in the wheelchair at this time. He tells me that he is just not as comfortable in the bed as the wheelchair but unfortunately this is causing damage to his legs which I discussed with him today as well. Nonetheless I am not sure that he is going to be amendable to doing anything different. 01-27-2023 upon evaluation today patient appears to be doing well currently. Fortunately I do not see any evidence of active infection locally nor systemically which is great news I think he is actually doing the best option and while in fact I would try to see if we can do some debridement today to get things moving along. 02-03-2023 upon evaluation today patient's wounds unfortunately are not doing nearly as well. Will be doing the Foot locker wraps unfortunately we just not seeing the improvement that I would like to see. Fortunately I do not see any evidence of infection at this time which is good news unfortunately I do think that he needs to be changed on a daily basis due to the amount of drainage otherwise I do not think this is really going to continue to improve dramatically. 02-12-2023 upon evaluation today patient appears to be doing better in a lot of ways in regard to his wounds and although his toes on the left side actually seem a bit worse. Fortunately I do not see any evidence of active infection locally nor  systemically at this time which is good news. With that being said he is having some bleeding from the left leg I think that this is an area that looks like it probably got bumped. With that being said I discussed this with the patient he tells me he does not know of any trauma that occurred but at the same time if was not this that it was skin that got pulled off. Dressing change there is only one of the 2 that could have happened. 7/25; this is a patient who is very frail at [facility]. He is nonambulatory. He has wounds on his bilateral lower legs. However much worse on the left we have been using silver cell to all of his wounds He comes in today and a lot more pain our intake nurse reports deterioration of the left foot. I ended up looking through some of his record. 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 I am really not certain of the stage of this currently or who is following 02-26-2023 upon evaluation today patient unfortunately appears to be doing significantly worse compared to where he was previous. The left foot is showing signs of dramatic worsening in regard to the overall appearance his toes are starting to become necrotic almost in full and he has much  more cyanosis present than what I even seen previous. He is already seeing Dr. Marea and unfortunately there was not anything that they saw that was an immediate cause for this but at the same time I feel like this may have  changed and even worsened I feel like this could be a vascular event and I believe this is a limb-threatening situation. 03/26/2023; I last saw this patient on 8/1. Wondered about below-knee amputations versus palliative care. 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 The wound has healed fairly well and he follows up with surgery today. He is back today looking at wounds on the right foot and lower leg. This includes 2 areas on the right heel, right dorsal foot right dorsal first and second toes and a small area on the right anterior lower tibia area. He has a lot of pain in this leg as well. The angiogram he had on 01/01/2023 specific to the right leg now demonstrated a fairly normal common femoral artery, profunda femoris artery and superficial and popliteal arteries without significant disease. 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 No doubt this has a lot to do with the discomfort in the right leg he describes when he is in bed. They have been using Xeroform on the wounds on the right. He has a lot of swelling in this leg with weeping edema 04-09-2023 upon evaluation today patient appears to be doing poorly still in regard to his right leg. 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 Fortunately I do not see any signs of active infection locally or systemically at this time which is great news. No fevers, chills, nausea, vomiting, or diarrhea. With that being said I am concerned still about the fact that he seems to be getting worse especially in regard to the heels I think that he is still sitting up most of the day which I think is still causing some swelling of his leg unfortunately. 04-16-2023 upon evaluation today patient actually appears to be doing little bit more poorly compared to last week this actually does appear  to be infected. Last week I really did not think that was the case but this seems like it may indeed be the case. Fortunately I do not see any signs of active infection locally or systemically at this time. 04-24-2023 upon evaluation today patient appears to be doing well currently in regard to his leg compared to where we were previous I do feel like that the antibiotics have been of benefit. Fortunately I do not see any signs of active infection at this time which is great news and in general I believe that we are making headway towards healing which is great news as well. With that being said he still has a lot of drainage I really feel like something like Jace would be better for him than just an ABD pad it would likely the drainage more or even Zetuvit's but I am not sure whether we can get either 1 of these at the facility for [facility], Chad [name] ([id number]) [id number] Page 14 of 17 him. 05-01-2023 upon evaluation today patient appears to be doing well currently in regard to his wounds which are showing signs of significant improvement is not any way shape or form infected like it was last time I saw him. I think the  antibiotics are doing a good job but I think he may need an extension here to ensure that it completely clears. 05-08-2023 upon evaluation today patient appears to be doing well currently in regard to his wounds for the most part. He still has blue-green drainage noted but in general seems to be doing much better compared to where we were previous. Fortunately I do not see any signs of active infection locally or systemically which is great news and in general I do believe that we are making good headway towards complete closure. 05-18-2023 upon evaluation today patient appears to be doing well currently in regard to his wounds I feel like he is definitely making some improvements here. This is a slow improvement but nonetheless improvement. I do believe  that he is doing quite well as far as infection is concerned which is also good news. 05-25-2023 upon evaluation today patient actually appears to be doing worse in regard to his legs at this point. Unfortunately he continues to have issues with significant drainage which is the primary concern here. I do feel like that the wound currently is not doing a whole lot better when he was on the Levaquin  I felt like it was improving some but the concern there is QT prolongation I do not want to continue that personally further I think he needs a referral ASAP to infectious disease. I discussed that with him today. He also needs to be having this changed every day and that has not been happening at this point. That is my biggest concern here. 06-09-2023 upon evaluation today patient appears to be doing still poorly in regard to this right leg which is draining profusely. This is unfortunately just continuing to show signs of being very moist and wet and weeping quite significantly. This is despite everything that we are doing including the facility have been placed him on Levaquin  by ID which unfortunately just does not seem to be helping to heal this as well as we would hope either. I do have an infectious disease appointment that we sent 10 for him and that is actually scheduled for next week on the 19th. 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 06-17-2023 upon evaluation today patient appears to be doing still poorly in regard to his legs at this point. Unfortunately he still seems to be having issues with significant drainage we are having this changed every single day. With that being said despite this the patient just does not seem to be making a lot of headway here towards closure. We are trying to do what we can from the standpoint of compression as well. He did see infectious disease yesterday. I  did review that note as well. Upon evaluation the note actually states that the patient told the physician do I have to keep suffering with wounds forevero Is there a way to get rid of these woundso However he did not want a referral to see Dr. Harden for potential amputation which seems to be 1 option that was proposed. With that being said he also apparently told her that he was adamant about not doing another course of antibiotics as he feels like they have not healed his wounds. It appears based on evaluation with regard to the record that the patient did have a discussion with her about the possibility of doing a short course of IV cefepime  for  14 days to cover Serratia as well as the other bacteria noted on culture. However the patient declined this as he felt like the antibiotics in the past he had taken did not help at all and there were no signs and symptoms to suggest sepsis. Therefore she recommended continuing wound care and to continue to follow-up with vascular follow-up in 2 weeks with her. I appreciate this evaluation and again I cannot do anything with the fact that the patient did not want to proceed with any further treatment with regard to the bacterial infection. 06-23-2023 the patient seems to making some progress here in regard to his wound. I feel like it is still a tremendous amount of drainage but at the same time I feel like you are also showing some signs of significant improvement which is great news and very pleased with where things stand currently. Especially compared to where we have been. 07-07-2023 upon evaluation today patient appears to be doing about the same currently in regard to his wounds. He does not seem quite as erythematous as he has been around the wounds but the wounds continue to get larger due to the swelling and the drainage. Again I think the moisture is causing additional and further breakdown which is unfortunate. With that being said I discussed  with the patient that I do believe that the only way to get this better is going to be to keep the swelling under control and the drainage off of his skin. This is the hardest part to be sure. I think the alginate dressing is being used at the facility is actually keeping things more moist which is not ideal. 07-15-2023 upon evaluation today patient appears to be doing poorly still in regard to his wounds he still continues to have significant issues. Fortunately I do not see any signs of active infection which is great news and in general I am pleased in that regard but at the same time I do believe that he is not doing quite as well as he could be especially in light of the fact that he still did put the alginate on which is gets very wet and stays completely soaked on the wounds on trying to get this off and just use the XtraSorb which would think be much better. 12/31; this is a patient with combined severe PAD and venous insufficiency. He has very substantial wound areas involving the left lower leg especially medially left dorsal foot. Weeping edema fluid. We have been using silver alginate Xtrasorb Objective Constitutional Patient is hypertensive. appears well. Vitals Time Taken: 10:28 AM, Height: 69 in, Weight: 150 lbs, BMI: 22.1, Temperature: 98.0 F, Pulse: 87 bpm, Respiratory Rate: 16 breaths/min, Blood Pressure: 97/65 mmHg. 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 Wound exam; unfortunately substantial wound area on the right medial right posterior lower leg with some areas in the dorsal foot on the right as well. There is edema in the right lower leg weeping edema fluid. No overt evidence of infection Integumentary (Hair, Skin) Wound #16 status is Open. Original cause of wound was Gradually Appeared. The date acquired was: 09/26/2022. The wound has been in treatment 42 weeks. The wound is located on the Right Calcaneus. The wound measures 10cm length x 10cm width x 0.3cm depth;  78.54cm^2 area and 23.562cm^3 volume. There is a medium amount of serosanguineous drainage noted. There is a medium (34-66%) amount of necrotic tissue within the wound bed including Adherent Slough. Wound #18 status is Open. Original  cause of wound was Pressure Injury. The date acquired was: 09/26/2022. The wound has been in treatment 41 weeks. The wound is located on the Right,Medial T Second. The wound measures 3cm length x 2cm width x 0.1cm depth; 4.712cm^2 area and 0.471cm^3 volume. There oe is a medium amount of serosanguineous drainage noted. There is a medium (34-66%) amount of necrotic tissue within the wound bed including Adherent Slough. Wound #21 status is Open. Original cause of wound was Pressure Injury. The date acquired was: 03/02/2023. The wound has been in treatment 17 weeks. The wound is located on the Right,Dorsal Foot. The wound measures 3.5cm length x 5cm width x 0.2cm depth; 13.744cm^2 area and 2.749cm^3 volume. There is a medium amount of serous drainage noted. There is a medium (34-66%) amount of necrotic tissue within the wound bed including Adherent Slough. COLEN, ELTZROTH ([id number]) [id number] Page 15 of 17 Wound #22 status is Open. Original cause of wound was Pressure Injury. The date acquired was: 03/02/2023. The wound has been in treatment 17 weeks. The wound is located on the Right,Anterior Lower Leg. The wound measures 18cm length x 14cm width x 0.2cm depth; 197.92cm^2 area and 39.584cm^3 volume. There is a medium amount of serous drainage noted. Wound #23 status is Open. Original cause of wound was Pressure Injury. The date acquired was: 05/02/2023. The wound has been in treatment 11 weeks. The wound is located on the Right,Posterior Lower Leg. The wound measures 7cm length x 9cm width x 0.2cm depth; 49.48cm^2 area and 9.896cm^3 volume. There is a medium amount of serosanguineous drainage noted. 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 Nurse Visit as needed Bathing/ Shower/ Hygiene: Wash wounds with antibacterial soap and water . - LEAVE OFF THE FOAM DRESSING. COVER WOUNDS WITH ABD PADS (DOUBLED IF NEEDED) AND WRAP WITH KERLIX. PUT TUBIGRIP OVER KERLIX. CHANGE DAILY No tub bath. Anesthetic (Use 'Patient Medications' Section for Anesthetic Order Entry): Lidocaine  applied to wound bed Off-Loading: Heel suspension boot - when in bed Turn and reposition every 2 hours WOUND #16: - Calcaneus Wound Laterality: Right Cleanser: Soap and Water  1 x Per Day/30 Days Discharge Instructions: Gently cleanse wound with antibacterial soap, rinse and pat dry prior to dressing wounds Cleanser: Wound Cleanser 1 x Per Day/30 Days Discharge Instructions: Wash your hands with soap and water . Remove old dressing, discard into plastic bag and place into trash. Cleanse the wound with Wound Cleanser prior to applying a clean dressing using gauze sponges, not tissues or cotton balls. Do not scrub or use excessive force. Pat dry using gauze sponges, not tissue or cotton balls. Secondary Dressing: ABD Pad 5x9 (in/in) 1 x Per Day/30 Days Discharge Instructions: Cover with ABD pad Secondary Dressing: Heel Cup 1 x Per Day/30 Days Secured With: Kerlix Roll Sterile or Non-Sterile 6-ply 4.5x4 (yd/yd) 1 x Per Day/30 Days Discharge Instructions: Apply Kerlix as directed Secured With: Tubigrip Size D, 3x10 (in/yd) 1 x Per Day/30 Days WOUND #18: - T Second Wound Laterality: Right, Medial oe Cleanser: Soap and Water  1 x Per Day/30  Days Discharge Instructions: Gently cleanse wound with antibacterial soap, rinse and pat dry prior to  dressing wounds Cleanser: Wound Cleanser 1 x Per Day/30 Days Discharge Instructions: Wash your hands with soap and water . Remove old dressing, discard into plastic bag and place into trash. Cleanse the wound with Wound Cleanser prior to applying a clean dressing using gauze sponges, not tissues or cotton balls. Do not scrub or use excessive force. Pat dry using gauze sponges, not tissue or cotton balls. Secondary Dressing: ABD Pad 5x9 (in/in) 1 x Per Day/30 Days Discharge Instructions: Cover with ABD pad Secondary Dressing: Heel Cup 1 x Per Day/30 Days Secured With: Kerlix Roll Sterile or Non-Sterile 6-ply 4.5x4 (yd/yd) 1 x Per Day/30 Days Discharge Instructions: Apply Kerlix as directed Secured With: Tubigrip Size D, 3x10 (in/yd) 1 x Per Day/30 Days WOUND #21: - Foot Wound Laterality: Dorsal, Right Cleanser: Soap and Water  1 x Per Day/30 Days Discharge Instructions: Gently cleanse wound with antibacterial soap, rinse and pat dry prior to dressing wounds Cleanser: Wound Cleanser 1 x Per Day/30 Days Discharge Instructions: Wash your hands with soap and water . Remove old dressing, discard into plastic bag and place into trash. Cleanse the wound with Wound Cleanser prior to applying a clean dressing using gauze sponges, not tissues or cotton balls. Do not scrub or use excessive force. Pat dry using gauze sponges, not tissue or cotton balls. Secondary Dressing: ABD Pad 5x9 (in/in) 1 x Per Day/30 Days Discharge Instructions: Cover with ABD pad Secondary Dressing: Heel Cup 1 x Per Day/30 Days Secured With: Kerlix Roll Sterile or Non-Sterile 6-ply 4.5x4 (yd/yd) 1 x Per Day/30 Days Discharge Instructions: Apply Kerlix as directed Secured With: Tubigrip Size D, 3x10 (in/yd) 1 x Per Day/30 Days WOUND #22: - Lower Leg Wound Laterality: Right, Anterior Cleanser: Soap and Water  1 x Per Day/30  Days Discharge Instructions: Gently cleanse wound with antibacterial soap, rinse and pat dry prior to dressing wounds Cleanser: Wound Cleanser 1 x Per Day/30 Days Discharge Instructions: Wash your hands with soap and water . Remove old dressing, discard into plastic bag and place into trash. Cleanse the wound with Wound Cleanser prior to applying a clean dressing using gauze sponges, not tissues or cotton balls. Do not scrub or use excessive force. Pat dry using gauze sponges, not tissue or cotton balls. Secondary Dressing: ABD Pad 5x9 (in/in) 1 x Per Day/30 Days Discharge Instructions: Cover with ABD pad Secondary Dressing: Heel Cup 1 x Per Day/30 Days ADD, DINAPOLI ([id number]) [phone].pdf Page 16 of 17 Secured With: [employer] or Non-Sterile 6-ply 4.5x4 (yd/yd) 1 x Per Day/30 Days Discharge Instructions: Apply Kerlix as directed Secured With: Tubigrip Size D, 3x10 (in/yd) 1 x Per Day/30 Days WOUND #23: - Lower Leg Wound Laterality: Right, Posterior Cleanser: Soap and Water  1 x Per Day/30 Days Discharge Instructions: Gently cleanse wound with antibacterial soap, rinse and pat dry prior to dressing wounds Cleanser: Wound Cleanser 1 x Per Day/30 Days Discharge Instructions: Wash your hands with soap and water . Remove old dressing, discard into plastic bag and place into trash. Cleanse the wound with Wound Cleanser prior to applying a clean dressing using gauze sponges, not tissues or cotton balls. Do not scrub or use excessive force. Pat dry using gauze sponges, not tissue or cotton balls. Secondary Dressing: ABD Pad 5x9 (in/in) 1 x Per Day/30 Days Discharge Instructions: Cover with ABD pad Secondary Dressing: Heel Cup 1 x Per Day/30 Days Secured With: Kerlix Roll Sterile or Non-Sterile 6-ply 4.5x4 (yd/yd) 1 x Per Day/30 Days Discharge Instructions: Apply Kerlix as directed Secured With: Tubigrip Size  D, 3x10 (in/yd) 1 x Per Day/30 Days 1. The patient  is using silver alginate Xtrasorb ABDs and Curlex. Then he is using Tubigrip over all of this. 2. I do not think this patient is going to have a viable right lower leg. I think he is going to need a right BKA. 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 I do not know who is predominantly planning and referring back to vascular surgery who did the left BKA. Fortunately for the moment he does not appear to be systemically unwell Electronic Signature(s) Signed: 07/28/2023 4:49:51 PM By: Rufus Sharper MD Entered By: Rufus Sharper on 07/28/2023 12:13:45 -------------------------------------------------------------------------------- SuperBill Details Patient Name: Date of Service: YUTO, CAJUSTE 07/28/2023 Medical Record Number: [id number] Patient Account Number: [id number] Date of Birth/Sex: Treating RN: 26-Aug-1960 (63 y.o. [name] Primary Care Provider: Gerlean Maier Other Clinician: Referring Provider: Treating Provider/Extender: Chad [name], Chad [name] Gerlean Maier Weeks in Treatment: 85 Diagnosis Coding ICD-10 Codes Code Description [phone] Chronic venous hypertension (idiopathic) with ulcer and inflammation of left lower extremity 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, stage 3 Facility Procedures : CPT4 Code: [id number] Description: 00784 - WOUND CARE VISIT-LEV 5 EST PT Modifier: Quantity: 1 Physician Procedures : CPT4 Code Description Modifier [id number] - WC PHYS LEVEL 3 - EST PT ICD-10 Diagnosis Description L97.518 Non-pressure chronic ulcer of other part of right foot with other specified severity L98.492 Non-pressure  chronic ulcer of skin of other sites  with fat layer exposed 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 foot 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, stage 3 Tissue, Navi ([id number]) [id number] Page 17 of 1 Quantity: 1 7 Electronic Signature(s) Signed: 07/28/2023 4:49:51 PM By: Rufus Sharper MD Entered By: Rufus Sharper on 07/28/2023 12:14:13

## 2023-07-29 NOTE — Progress Notes (Signed)
 Chad [name], Chad [name] ([id number]) [id number] Page 1 of 15 Visit Report for 07/28/2023 Arrival Information Details Patient Name: Date of Service: Chad [name], Chad [name] 07/28/2023 10:15 A M Medical Record Number: [id number] Patient Account Number: [id number] Date of Birth/Sex: Treating RN: [birth year] (63 y.o. Chad [name] [name] Primary Care Aundraya Dripps: Gerlean Maier Other Clinician: Referring Airika Alkhatib: Treating Alvina Strother/Extender: [name], [name] Gerlean Maier Devra in Treatment: 61 Visit Information History Since Last Visit Added or deleted any medications: No Patient Arrived: Wheel Chair Any new allergies or adverse reactions: No Arrival Time: 10:25 Had a fall or experienced change in No Accompanied By: self activities of daily living that may affect Transfer Assistance: None risk of falls: Patient Identification Verified: Yes Signs or symptoms of abuse/neglect since last visito No Secondary Verification Process Completed: Yes Hospitalized since last visit: No Patient Requires Transmission-Based No Has Dressing in Place as Prescribed: Yes Precautions: Pain Present Now: No Patient Has Alerts: Yes Patient Alerts: Patient on Blood Thinner NOT diabetic aspirin  81mg  Lives [name] ABI R 0.81 11/05/22 ABI L 0.59 11/05/22 [facility] 03/03/23 Electronic Signature(s) Signed: 07/28/2023 5:14:10 PM By: [name] MSN RN CNS WTA Entered By: [name] on 07/28/2023 11:14:19 -------------------------------------------------------------------------------- Clinic Level of Care Assessment Details Patient Name: Date of Service: Chad [name] 07/28/2023 10:15 A M Medical Record Number: [id number] Patient Account Number: [id number] Date of Birth/Sex: Treating RN: 01-Jan-1961 (63 y.o. Chad [name] [name] Primary Care Nathan Moctezuma: Gerlean Maier Other Clinician: Referring Chrisangel Eskenazi: Treating Chaniece Barbato/Extender: Chad BSO SAILOR, [name] Gerlean Maier Weeks in Treatment: 68 Clinic  Level of Care Assessment Items TOOL 4 Quantity Score X- 1 0 Use when only an EandM is performed on FOLLOW-UP visit ASSESSMENTS - Nursing Assessment / Reassessment [facility] ([id number]) [id number] Page 2 of 15 X- 1 10 Reassessment of Co-morbidities (includes updates in patient status) X- 1 5 Reassessment of Adherence to Treatment Plan ASSESSMENTS - Wound and Skin A ssessment / Reassessment []  - 0 Simple Wound Assessment / Reassessment - one wound X- 5 5 Complex Wound Assessment / Reassessment - multiple wounds []  - 0 Dermatologic / Skin Assessment (not related to wound area) ASSESSMENTS - Focused Assessment []  - 0 Circumferential Edema Measurements - multi extremities []  - 0 Nutritional Assessment / Counseling / Intervention []  - 0 Lower Extremity Assessment (monofilament, tuning fork, pulses) []  - 0 Peripheral Arterial Disease Assessment (using hand held doppler) ASSESSMENTS - Ostomy and/or Continence Assessment and Care []  - 0 Incontinence Assessment and Management []  - 0 Ostomy Care Assessment and Management (repouching, etc.) PROCESS - Coordination of Care []  - 0 Simple Patient / Family Education for ongoing care X- 1 20 Complex (extensive) Patient / Family Education for ongoing care X- 1 10 Staff obtains Chiropractor, Records, T Results / Process Orders est []  - 0 Staff telephones HHA, Nursing Homes / Clarify orders / etc []  - 0 Routine Transfer to another Facility (non-emergent condition) []  - 0 Routine [name] Admission (non-emergent condition) []  - 0 New Admissions / Manufacturing Engineer / Ordering NPWT Apligraf, etc. , []  - 0 Emergency [name] Admission (emergent condition) []  - 0 Simple Discharge Coordination X- 1 15 Complex (extensive) Discharge Coordination PROCESS - Special Needs []  - 0 Pediatric / Minor Patient Management []  - 0 Isolation Patient Management []  - 0 Hearing / Language / Visual special needs X-  1 15 Assessment of Community assistance (transportation, D/C planning, etc.) []  - 0 Additional assistance / Altered mentation []  - 0 Support Surface(s) Assessment (bed, cushion, seat,  etc.) INTERVENTIONS - Wound Cleansing / Measurement []  - 0 Simple Wound Cleansing - one wound X- 5 5 Complex Wound Cleansing - multiple wounds X- 1 5 Wound Imaging (photographs - any number of wounds) []  - 0 Wound Tracing (instead of photographs) []  - 0 Simple Wound Measurement - one wound X- 5 5 Complex Wound Measurement - multiple wounds INTERVENTIONS - Wound Dressings []  - 0 Small Wound Dressing one or multiple wounds X- 2 15 Medium Wound Dressing one or multiple wounds X- 3 20 Large Wound Dressing one or multiple wounds []  - 0 Application of Medications - topical []  - 0 Application of Medications - injection Brazzle, [name] ([id number]) [id number] Page 3 of 15 INTERVENTIONS - Miscellaneous []  - 0 External ear exam []  - 0 Specimen Collection (cultures, biopsies, blood, body fluids, etc.) []  - 0 Specimen(s) / Culture(s) sent or taken to Lab for analysis []  - 0 Patient Transfer (multiple staff / Deitra Lift / Similar devices) []  - 0 Simple Staple / Suture removal (25 or less) []  - 0 Complex Staple / Suture removal (26 or more) []  - 0 Hypo / Hyperglycemic Management (close monitor of Blood Glucose) []  - 0 Ankle / Brachial Index (ABI) - do not check if billed separately X- 1 5 Vital Signs Has the patient been seen at the [name] within the last three years: Yes Total Score: 250 Level Of Care: New/Established - Level 5 Electronic Signature(s) Signed: 07/28/2023 5:14:10 PM By: [name] MSN RN CNS WTA Entered By: [name] on 07/28/2023 11:42:25 -------------------------------------------------------------------------------- Encounter Discharge Information Details Patient Name: Date of Service: Chad [name] 07/28/2023 10:15 A M Medical Record  Number: [id number] Patient Account Number: [id number] Date of Birth/Sex: Treating RN: 06-Dec-1960 (63 y.o. Chad [name] [name] Primary Care Alani Lacivita: Gerlean Maier Other Clinician: Referring Brynnleigh Mcelwee: Treating Aijalon Kirtz/Extender: Chad BSO LOISE, [name] Gerlean Maier Devra in Treatment: 20 Encounter Discharge Information Items Discharge Condition: Stable Ambulatory Status: Wheelchair Discharge Destination: Skilled Nursing Facility Telephoned: No Orders Sent: Yes Transportation: Other Accompanied By: self Schedule Follow-up Appointment: Yes Clinical Summary of Care: Electronic Signature(s) Signed: 07/28/2023 5:14:10 PM By: [name] MSN RN CNS WTA Entered By: [name] on 07/28/2023 11:43:45 Chad [name] ([id number]) [id number] Page 4 of 15 -------------------------------------------------------------------------------- Lower Extremity Assessment Details Patient Name: Date of Service: Chad [name], Chad [name] 07/28/2023 10:15 A M Medical Record Number: [id number] Patient Account Number: [id number] Date of Birth/Sex: Treating RN: Nov 23, 1960 (63 y.o. Chad [name] [name] Primary Care Nimrat Woolworth: Gerlean Maier Other Clinician: Referring Sumaya Riedesel: Treating Karsen Nakanishi/Extender: Chad BSO N, [name] Gerlean Maier Weeks in Treatment: 85 Edema Assessment Assessed: [Left: No] [Right: No] [Left: Edema] [Right: :] Calf Left: Right: Point of Measurement: 33 cm From Medial Instep 41 cm Ankle Left: Right: Point of Measurement: 12 cm From Medial Instep 25 cm Vascular Assessment Pulses: Dorsalis Pedis Palpable: [Right:No] Extremity colors, hair growth, and conditions: Extremity Color: [Right:Red] Hair Growth on Extremity: [Right:No] Temperature of Extremity: [Right:Warm] Capillary Refill: [Right:< 3 seconds] Dependent Rubor: [Right:No] Blanched when Elevated: [Right:No No] Toe Nail Assessment Left: Right: Thick: Yes Discolored: Yes Deformed: Yes Improper Length and  Hygiene: Yes Electronic Signature(s) Signed: 07/28/2023 5:14:10 PM By: [name] MSN RN CNS WTA Entered By: [name] on 07/28/2023 10:54:33 Chad [name] ([id number]) [id number] Page 5 of 15 -------------------------------------------------------------------------------- Multi Wound Chart Details Patient Name: Date of Service: Chad [name], Chad [name] 07/28/2023 10:15 A M Medical Record Number: [id number] Patient Account Number: [id number] Date of Birth/Sex: Treating RN: 02-24-1961 (63 y.o. Chad [name] [name]  Primary Care Karra Pink: Gerlean Maier Other Clinician: Referring Lavonne Cass: Treating Jerrelle Michelsen/Extender: [name], [name] Gerlean Maier Weeks in Treatment: 85 Vital Signs Height(in): 69 Pulse(bpm): 87 Weight(lbs): 150 Blood Pressure(mmHg): 97/65 Body Mass Index(BMI): 22.1 Temperature(F): 98.0 Respiratory Rate(breaths/min): 16 [16:Photos:] Right Calcaneus Right, Medial T Second oe Right, Dorsal Foot Wound Location: Gradually Appeared Pressure Injury Pressure Injury Wounding Event: Pressure Ulcer Pressure Ulcer Venous Leg Ulcer Primary Etiology: Anemia, Chronic Obstructive Anemia, Chronic Obstructive Anemia, Chronic Obstructive Comorbid History: Pulmonary Disease (COPD), Coronary Pulmonary Disease (COPD), Coronary Pulmonary Disease (COPD), Coronary Artery Disease, Peripheral Arterial Artery Disease, Peripheral Arterial Artery Disease, Peripheral Arterial Disease, Peripheral Venous Disease, Disease, Peripheral Venous Disease, Disease, Peripheral Venous Disease, Hepatitis C, Osteoarthritis, Hepatitis C, Osteoarthritis, Hepatitis C, Osteoarthritis, Neuropathy, Received Chemotherapy Neuropathy, Received Chemotherapy Neuropathy, Received Chemotherapy 09/26/2022 09/26/2022 03/02/2023 Date Acquired: 42 41 17 Weeks of Treatment: Open Open Open Wound Status: No No No Wound Recurrence: Yes No Yes Clustered Wound: 10x10x0.3 3x2x0.1 3.5x5x0.2 Measurements L  x W x D (cm) 78.54 4.712 13.744 A (cm) : rea 23.562 0.471 2.749 Volume (cm) : -8237.60% -756.70% -677.80% % Reduction in Area: -12433.00% -756.40% -1453.10% % Reduction in Volume: Category/Stage III Category/Stage III Full Thickness Without Exposed Classification: Support Structures Medium Medium Medium Exudate Amount: Serosanguineous Serosanguineous Serous Exudate Type: red, brown red, brown amber Exudate Color: N/A N/A N/A Necrotic Amount: Wound Number: 22 23 N/A Photos: N/A Right, Anterior Lower Leg Right, Posterior Lower Leg N/A Wound Location: Pressure Injury Pressure Injury N/A Wounding Event: Venous Leg Ulcer Pressure Ulcer N/A Primary Etiology: Anemia, Chronic Obstructive Anemia, Chronic Obstructive N/A Comorbid History: Pulmonary Disease (COPD), Coronary Pulmonary Disease (COPD), Coronary Artery Disease, Peripheral Arterial Artery Disease, Peripheral Arterial Disease, Peripheral Venous Disease, Disease, Peripheral Venous Disease, Hepatitis C, Osteoarthritis, Hepatitis C, Osteoarthritis, Neuropathy, Received Chemotherapy Neuropathy, Received Chemotherapy 03/02/2023 05/02/2023 N/A Date Acquired: 17 11 N/A Weeks of Treatment: Open Open N/A Wound Status: No No N/A Wound Recurrence: Yes Yes N/A Clustered Wound: 18x14x0.2 7x9x0.2 N/A Measurements L x W x D (cm) 197.92 49.48 N/A A (cm) : rock Chad [facility]LESTON ([id number]) [id number] Page 6 of 15 39.584 9.896 N/A Volume (cm) : -8300.70% -215.00% N/A % Reduction in Area: -8304.20% -529.90% N/A % Reduction in Volume: Full Thickness Without Exposed Category/Stage III N/A Classification: Support Structures Medium Medium N/A Exudate Amount: Serous Serosanguineous N/A Exudate Type: amber red, brown N/A Exudate Color: N/A N/A N/A Necrotic Amount: Treatment Notes Electronic Signature(s) Signed: 07/28/2023 5:14:10 PM By: [name] MSN RN CNS WTA Entered By: [name] on  07/28/2023 10:54:39 -------------------------------------------------------------------------------- Multi-Disciplinary Care Plan Details Patient Name: Date of Service: Chad [name] 07/28/2023 10:15 A M Medical Record Number: [id number] Patient Account Number: [id number] Date of Birth/Sex: Treating RN: 11-14-1960 (63 y.o. Chad [name] [name] Primary Care Ayisha Pol: Gerlean Maier Other Clinician: Referring Amarachi Kotz: Treating Zlaty Alexa/Extender: Chad BSO N, [name] Gerlean Maier Weeks in Treatment: 13 Active Inactive Venous Leg Ulcer Nursing Diagnoses: Knowledge deficit related to disease process and management Goals: Patient will maintain optimal edema control Date Initiated: 12/06/2021 Target Resolution Date: 07/26/2023 Goal Status: Active Patient/caregiver will verbalize understanding of disease process and disease management Date Initiated: 12/06/2021 Date Inactivated: 12/19/2022 Target Resolution Date: 11/27/2022 Goal Status: Met Interventions: Assess peripheral edema status every visit. Compression as ordered Notes: Electronic Signature(s) Signed: 07/28/2023 5:14:10 PM By: [name] MSN RN CNS WTA Entered By: [name] on 07/28/2023 11:42:38 Chad [facility] ([id number]) [id number] Page 7 of 15 -------------------------------------------------------------------------------- Pain Assessment Details Patient Name: Date of Service:  Chad [name], Chad [name] 07/28/2023 10:15 A M Medical Record Number: [id number] Patient Account Number: [id number] Date of Birth/Sex: Treating RN: March 13, 1961 (63 y.o. Chad [name] [name] Primary Care Keaundre Thelin: Gerlean Maier Other Clinician: Referring Jordain Radin: Treating Zaven Klemens/Extender: Chad BSO N, [name] Gerlean Maier Weeks in Treatment: 29 Active Problems Location of Pain Severity and Description of Pain Patient Has Paino Yes Site Locations Rate the pain. Current Pain Level: 5 Pain Management and Medication Current Pain  Management: Electronic Signature(s) Signed: 07/28/2023 5:14:10 PM By: [name] MSN RN CNS WTA Entered By: [name] on 07/28/2023 11:14:30 -------------------------------------------------------------------------------- Patient/Caregiver Education Details Patient Name: Date of Service: Chad [name] 12/31/2024andnbsp10:15 A M Medical Record Number: [id number] Patient Account Number: [id number] Date of Birth/Gender: Treating RN: 1960/11/16 (63 y.o. Chad [name] [name] Primary Care Physician: Gerlean Maier Other Clinician: Referring Physician: Treating Physician/Extender: Chad BSO LOISE, [name] Gerlean Maier Weeks in Treatment: [address], [place] ([id number]) [id number] Page 8 of 15 Education Assessment Education Provided To: Patient Education Topics Provided Wound/Skin Impairment: Handouts: Caring for Your Ulcer Methods: Explain/Verbal Responses: State content correctly Electronic Signature(s) Signed: 07/28/2023 5:14:10 PM By: [name] MSN RN CNS WTA Entered By: [name] on 07/28/2023 11:42:50 -------------------------------------------------------------------------------- Wound Assessment Details Patient Name: Date of Service: Chad [name], Chad [name] 07/28/2023 10:15 A M Medical Record Number: [id number] Patient Account Number: [id number] Date of Birth/Sex: Treating RN: [birth year] (64 y.o. Chad [name] [name] Primary Care Dellis Voght: Gerlean Maier Other Clinician: Referring Arthelia Callicott: Treating Daysie Helf/Extender: Chad BSO N, [name] Gerlean Maier Weeks in Treatment: 85 Wound Status Wound Number: 16 Primary Pressure Ulcer Etiology: Wound Location: Right Calcaneus Wound Open Wounding Event: Gradually Appeared Status: Date Acquired: 09/26/2022 Comorbid Anemia, Chronic Obstructive Pulmonary Disease (COPD), Coronary Weeks Of Treatment: 42 History: Artery Disease, Peripheral Arterial Disease, Peripheral Venous Clustered Wound: Yes Disease, Hepatitis C,  Osteoarthritis, Neuropathy, Received Chemotherapy Photos Wound Measurements Length: (cm) 10 Width: (cm) 10 Depth: (cm) 0.3 Area: (cm) 78.54 Volume: (cm) 23.562 % Reduction in Area: -8237.6% % Reduction in Volume: -12433% Wound Description Classification: Category/Stage III Exudate Amount: Medium Casad, Avyan ([id number]) Exudate Type: Serosanguineous Exudate Color: red, brown [id number] Page 9 of 15 Wound Bed Necrotic Amount: Medium (34-66%) Necrotic Quality: Adherent Slough Treatment Notes Wound #16 (Calcaneus) Wound Laterality: Right Cleanser Soap and Water  Discharge Instruction: Gently cleanse wound with antibacterial soap, rinse and pat dry prior to dressing wounds Wound Cleanser Discharge Instruction: Wash your hands with soap and water . Remove old dressing, discard into plastic bag and place into trash. Cleanse the wound with Wound Cleanser prior to applying a clean dressing using gauze sponges, not tissues or cotton balls. Do not scrub or use excessive force. Pat dry using gauze sponges, not tissue or cotton balls. Peri-Wound Care Topical Primary Dressing Secondary Dressing ABD Pad 5x9 (in/in) Discharge Instruction: Cover with ABD pad Heel Cup Secured With Kerlix Roll Sterile or Non-Sterile 6-ply 4.5x4 (yd/yd) Discharge Instruction: Apply Kerlix as directed Tubigrip Size D, 3x10 (in/yd) Compression Wrap Compression Stockings Add-Ons Electronic Signature(s) Signed: 07/28/2023 5:14:10 PM By: [name] MSN RN CNS WTA Entered By: [name] on 07/28/2023 10:49:19 -------------------------------------------------------------------------------- Wound Assessment Details Patient Name: Date of Service: Chad [name] 07/28/2023 10:15 A M Medical Record Number: [id number] Patient Account Number: [id number] Date of Birth/Sex: Treating RN: 10/22/1960 (63 y.o. Chad [name] [name] Primary Care Quintin Hjort: Gerlean Maier Other  Clinician: Referring Jeramiah Mccaughey: Treating Trase Bunda/Extender: Chad BSO N, [name] Gerlean Maier Weeks in Treatment: 85 Wound Status Wound Number: 18 Primary Pressure Ulcer Etiology:  Wound Location: Right, Medial T Second oe Wound Open Wounding Event: Pressure Injury Status: Date Acquired: 09/26/2022 Comorbid Anemia, Chronic Obstructive Pulmonary Disease (COPD), Coronary Weeks Of Treatment: 41 History: Artery Disease, Peripheral Arterial Disease, Peripheral Venous Clustered Wound: No Disease, Hepatitis C, Osteoarthritis, Neuropathy, Received Chemotherapy Chad [name], Chad [name] ([id number]) [id number] Page 10 of 15 Photos Wound Measurements Length: (cm) 3 Width: (cm) 2 Depth: (cm) 0.1 Area: (cm) 4.712 Volume: (cm) 0.471 % Reduction in Area: -756.7% % Reduction in Volume: -756.4% Wound Description Classification: Category/Stage III Exudate Amount: Medium Exudate Type: Serosanguineous Exudate Color: red, brown Wound Bed Necrotic Amount: Medium (34-66%) Necrotic Quality: Adherent Slough Treatment Notes Wound #18 (Toe Second) Wound Laterality: Right, Medial Cleanser Soap and Water  Discharge Instruction: Gently cleanse wound with antibacterial soap, rinse and pat dry prior to dressing wounds Wound Cleanser Discharge Instruction: Wash your hands with soap and water . Remove old dressing, discard into plastic bag and place into trash. Cleanse the wound with Wound Cleanser prior to applying a clean dressing using gauze sponges, not tissues or cotton balls. Do not scrub or use excessive force. Pat dry using gauze sponges, not tissue or cotton balls. Peri-Wound Care Topical Primary Dressing Secondary Dressing ABD Pad 5x9 (in/in) Discharge Instruction: Cover with ABD pad Heel Cup Secured With Kerlix Roll Sterile or Non-Sterile 6-ply 4.5x4 (yd/yd) Discharge Instruction: Apply Kerlix as directed Tubigrip Size D, 3x10 (in/yd) Compression Wrap Compression  Stockings Add-Ons Electronic Signature(s) Signed: 07/28/2023 5:14:10 PM By: [name] MSN RN CNS WTA Entered By: [name] on 07/28/2023 10:49:49 Chad [facility] ([id number]) [id number] Page 11 of 15 -------------------------------------------------------------------------------- Wound Assessment Details Patient Name: Date of Service: Chad [name], Chad [name] 07/28/2023 10:15 A M Medical Record Number: [id number] Patient Account Number: [id number] Date of Birth/Sex: Treating RN: April 09, 1961 (63 y.o. Chad [name] [name] Primary Care Ciro Tashiro: Gerlean Maier Other Clinician: Referring Javyn Havlin: Treating Ayden Apodaca/Extender: Chad BSO N, [name] Gerlean Maier Weeks in Treatment: 85 Wound Status Wound Number: 21 Primary Venous Leg Ulcer Etiology: Wound Location: Right, Dorsal Foot Wound Open Wounding Event: Pressure Injury Status: Date Acquired: 03/02/2023 Comorbid Anemia, Chronic Obstructive Pulmonary Disease (COPD), Coronary Weeks Of Treatment: 17 History: Artery Disease, Peripheral Arterial Disease, Peripheral Venous Clustered Wound: Yes Disease, Hepatitis C, Osteoarthritis, Neuropathy, Received Chemotherapy Photos Wound Measurements Length: (cm) 3.5 Width: (cm) 5 Depth: (cm) 0.2 Area: (cm) 13.744 Volume: (cm) 2.749 % Reduction in Area: -677.8% % Reduction in Volume: -1453.1% Wound Description Classification: Full Thickness Without Exposed Suppor Exudate Amount: Medium Exudate Type: Serous Exudate Color: amber t Structures Wound Bed Necrotic Amount: Medium (34-66%) Necrotic Quality: Adherent Slough Treatment Notes Wound #21 (Foot) Wound Laterality: Dorsal, Right Cleanser Soap and Water  Discharge Instruction: Gently cleanse wound with antibacterial soap, rinse and pat dry prior to dressing wounds Wound Cleanser Discharge Instruction: Wash your hands with soap and water . Remove old dressing, discard into plastic bag and place into trash. Cleanse  the wound with Wound Cleanser prior to applying a clean dressing using gauze sponges, not tissues or cotton balls. Do not scrub or use excessive force. Pat dry using gauze sponges, not tissue or cotton balls. Chad [name], TREECE ([id number]) [id number] Page 12 of 15 Peri-Wound Care Topical Primary Dressing Secondary Dressing ABD Pad 5x9 (in/in) Discharge Instruction: Cover with ABD pad Heel Cup Secured With Kerlix Roll Sterile or Non-Sterile 6-ply 4.5x4 (yd/yd) Discharge Instruction: Apply Kerlix as directed Tubigrip Size D, 3x10 (in/yd) Compression Wrap Compression Stockings Add-Ons Electronic Signature(s) Signed: 07/28/2023 5:14:10 PM By: [name] MSN RN CNS WTA Entered By: [name]  on 07/28/2023 10:50:14 -------------------------------------------------------------------------------- Wound Assessment Details Patient Name: Date of Service: Chad [name], Chad [name] 07/28/2023 10:15 A M Medical Record Number: [id number] Patient Account Number: [id number] Date of Birth/Sex: Treating RN: 12-01-1960 (63 y.o. Chad [name] [name] Primary Care Teshaun Olarte: Gerlean Maier Other Clinician: Referring Honor Fairbank: Treating Alrick Cubbage/Extender: Chad BSO N, [name] Gerlean Maier Weeks in Treatment: 85 Wound Status Wound Number: 22 Primary Venous Leg Ulcer Etiology: Wound Location: Right, Anterior Lower Leg Wound Open Wounding Event: Pressure Injury Status: Date Acquired: 03/02/2023 Comorbid Anemia, Chronic Obstructive Pulmonary Disease (COPD), Coronary Weeks Of Treatment: 17 History: Artery Disease, Peripheral Arterial Disease, Peripheral Venous Clustered Wound: Yes Disease, Hepatitis C, Osteoarthritis, Neuropathy, Received Chemotherapy Photos Wound Measurements Length: (cm) 18 Width: (cm) 14 Depth: (cm) 0.2 Hedtke, Nic ([id number]) Area: (cm) 197.92 Volume: (cm) 39.584 % Reduction in Area: -8300.7% % Reduction in Volume:  -8304.2% [id number] Page 13 of 15 Wound Description Classification: Full Thickness Without Exposed Support Exudate Amount: Medium Exudate Type: Serous Exudate Color: amber Structures Treatment Notes Wound #22 (Lower Leg) Wound Laterality: Right, Anterior Cleanser Soap and Water  Discharge Instruction: Gently cleanse wound with antibacterial soap, rinse and pat dry prior to dressing wounds Wound Cleanser Discharge Instruction: Wash your hands with soap and water . Remove old dressing, discard into plastic bag and place into trash. Cleanse the wound with Wound Cleanser prior to applying a clean dressing using gauze sponges, not tissues or cotton balls. Do not scrub or use excessive force. Pat dry using gauze sponges, not tissue or cotton balls. Peri-Wound Care Topical Primary Dressing Secondary Dressing ABD Pad 5x9 (in/in) Discharge Instruction: Cover with ABD pad Heel Cup Secured With Kerlix Roll Sterile or Non-Sterile 6-ply 4.5x4 (yd/yd) Discharge Instruction: Apply Kerlix as directed Tubigrip Size D, 3x10 (in/yd) Compression Wrap Compression Stockings Add-Ons Electronic Signature(s) Signed: 07/28/2023 5:14:10 PM By: [name] MSN RN CNS WTA Entered By: [name] on 07/28/2023 10:50:38 -------------------------------------------------------------------------------- Wound Assessment Details Patient Name: Date of Service: Chad [name] 07/28/2023 10:15 A M Medical Record Number: [id number] Patient Account Number: [id number] Date of Birth/Sex: Treating RN: September 06, 1960 (63 y.o. Chad [name] [name] Primary Care Treyvone Chelf: Gerlean Maier Other Clinician: Referring Shantera Monts: Treating Yitzchak Kothari/Extender: Chad BSO N, [name] Gerlean Maier Weeks in Treatment: 85 Wound Status Wound Number: [id number] Primary Pressure Ulcer Etiology: Wound Location: Right, Posterior Lower Leg Wound Open Wounding Event: Pressure Injury Status: Date Acquired:  05/02/2023 Comorbid Anemia, Chronic Obstructive Pulmonary Disease (COPD), Coronary Weeks Of Treatment: 11 History: Artery Disease, Peripheral Arterial Disease, Peripheral Venous Clustered Wound: Yes Chad CHARLESTON ([id number]) [id number] Page 14 of 15 Clustered Wound: Yes Disease, Hepatitis C, Osteoarthritis, Neuropathy, Received Chemotherapy Photos Wound Measurements Length: (cm) 7 Width: (cm) 9 Depth: (cm) 0.2 Area: (cm) 49.48 Volume: (cm) 9.896 % Reduction in Area: -215% % Reduction in Volume: -529.9% Wound Description Classification: Category/Stage III Exudate Amount: Medium Exudate Type: Serosanguineous Exudate Color: red, brown Treatment Notes Wound #23 (Lower Leg) Wound Laterality: Right, Posterior Cleanser Soap and Water  Discharge Instruction: Gently cleanse wound with antibacterial soap, rinse and pat dry prior to dressing wounds Wound Cleanser Discharge Instruction: Wash your hands with soap and water . Remove old dressing, discard into plastic bag and place into trash. Cleanse the wound with Wound Cleanser prior to applying a clean dressing using gauze sponges, not tissues or cotton balls. Do not scrub or use excessive force. Pat dry using gauze sponges, not tissue or cotton balls. Peri-Wound Care Topical Primary Dressing Secondary Dressing ABD Pad 5x9 (in/in) Discharge Instruction: Cover with ABD pad  Heel Cup Secured With [employer] or Non-Sterile 6-ply 4.5x4 (yd/yd) Discharge Instruction: Apply Kerlix as directed Tubigrip Size D, 3x10 (in/yd) Compression Wrap Compression Stockings Add-Ons Electronic Signature(s) Signed: 07/28/2023 5:14:10 PM By: [name] MSN RN CNS WTA Entered By: [name] on 07/28/2023 10:51:03 Chad CHARLESTON ([id number]) [id number] Page 15 of 15 -------------------------------------------------------------------------------- Vitals Details Patient Name: Date of  Service: KAYEN, GRABEL 07/28/2023 10:15 A M Medical Record Number: [id number] Patient Account Number: [id number] Date of Birth/Sex: Treating RN: 1961-02-11 (63 y.o. Chad [name] [name] Primary Care Syon Tews: Gerlean Maier Other Clinician: Referring Tel Hevia: Treating Mahrosh Donnell/Extender: Chad BSO N, [name] Gerlean Maier Weeks in Treatment: 14 Vital Signs Time Taken: 10:28 Temperature (F): 98.0 Height (in): 69 Pulse (bpm): 87 Weight (lbs): 150 Respiratory Rate (breaths/min): 16 Body Mass Index (BMI): 22.1 Blood Pressure (mmHg): 97/65 Reference Range: 80 - 120 mg / dl Electronic Signature(s) Signed: 07/28/2023 5:14:10 PM By: [name] MSN RN CNS WTA Entered By: [name] on 07/28/2023 11:14:25

## 2023-07-31 ENCOUNTER — Other Ambulatory Visit (HOSPITAL_COMMUNITY): Payer: Self-pay

## 2023-07-31 ENCOUNTER — Ambulatory Visit
Admission: RE | Admit: 2023-07-31 | Discharge: 2023-07-31 | Disposition: A | Discharge status: Home / Self Care | Payer: Medicaid Other | Source: Ambulatory Visit | Attending: Internal Medicine

## 2023-07-31 DIAGNOSIS — C661 Malignant neoplasm of right ureter: Secondary | ICD-10-CM | POA: Insufficient documentation

## 2023-07-31 DIAGNOSIS — C187 Malignant neoplasm of sigmoid colon: Secondary | ICD-10-CM | POA: Insufficient documentation

## 2023-07-31 MED ORDER — IOHEXOL 300 MG/ML  SOLN
100.0000 mL | Freq: Once | INTRAMUSCULAR | Status: AC | PRN
  Administered 2023-07-31: 100 mL via INTRAVENOUS

## 2023-08-03 ENCOUNTER — Other Ambulatory Visit: Payer: Self-pay

## 2023-08-03 NOTE — Progress Notes (Addendum)
 Specialty Pharmacy Refill Coordination Note  Chad [name] is a 63 y.o. male contacted today regarding refills of specialty medication(s) Sofosbuvir -Velpatasvir    Patient requested Delivery   Delivery date: 08/10/23   Verified address: [address]  [place],  [place] [address]   Medication will be filled on 08/07/23.   Nurse did not see any tablets remaining. Patient should have about 10 days left on hand. Insurance should pay for refill on 01/10. Nurse is ok with refill and will check another area and will give us  a call back if needed.

## 2023-08-06 ENCOUNTER — Other Ambulatory Visit: Payer: Self-pay

## 2023-08-06 NOTE — Progress Notes (Signed)
 Patient was contacted today regarding specialty medication, due to upcoming weather conditions medications will be filled 08/06/23 and will be delivered 08/07/23

## 2023-08-10 ENCOUNTER — Encounter: Payer: Medicaid Other | Attending: Physician Assistant | Admitting: Physician Assistant

## 2023-08-10 DIAGNOSIS — L89613 Pressure ulcer of right heel, stage 3: Secondary | ICD-10-CM | POA: Diagnosis not present

## 2023-08-10 DIAGNOSIS — I70245 Atherosclerosis of native arteries of left leg with ulceration of other part of foot: Secondary | ICD-10-CM | POA: Insufficient documentation

## 2023-08-10 DIAGNOSIS — L98492 Non-pressure chronic ulcer of skin of other sites with fat layer exposed: Secondary | ICD-10-CM | POA: Diagnosis not present

## 2023-08-10 DIAGNOSIS — I87332 Chronic venous hypertension (idiopathic) with ulcer and inflammation of left lower extremity: Secondary | ICD-10-CM | POA: Insufficient documentation

## 2023-08-10 DIAGNOSIS — L97518 Non-pressure chronic ulcer of other part of right foot with other specified severity: Secondary | ICD-10-CM | POA: Diagnosis not present

## 2023-08-10 DIAGNOSIS — Z89512 Acquired absence of left leg below knee: Secondary | ICD-10-CM | POA: Diagnosis not present

## 2023-08-10 NOTE — Progress Notes (Deleted)
 HPI: Chad [name] is a 63 y.o. male who presents to the [facility] pharmacy clinic for Hepatitis C follow-up.  Medication: Epclusa  x 12 weeks  Start Date: 07/15/24 ?***  Hepatitis C Genotype: 1a  Fibrosis Score: 0.18, F0  Hepatitis C RNA: 1.38 million international units /mL  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   Genetic testing 06/15/2019   Swelling of limb 04/15/2019   History of colon cancer 03/24/2019   Family history of prostate cancer    Family history of colon cancer    Family history of leukemia    Family history of kidney cancer    Goals of care, counseling/discussion 12/25/2018   Chronic pain syndrome 11/18/2018   Peripheral vascular disease of lower extremity with ulceration (HCC) 10/04/2018   Neoplasm related pain 09/08/2018   Poor appetite 09/08/2018   Weakness generalized 07/02/2018   Anorexia 07/02/2018   Chronic pain due to neoplasm 07/02/2018   Iron  deficiency anemia due to chronic blood loss 05/27/2018   Ureteral cancer, 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    AMINO ACIDS-PROTEIN HYDROLYS (PRO-STAT) LIQD    Take 45 mLs by mouth daily.   ASCORBIC ACID  (VITAMIN C ) 500 MG TABLET    Take 1 tablet (500 mg total) by mouth 2 (two) times daily.   ASPIRIN  EC 81 MG TABLET    Take 1 tablet (81 mg total) by mouth daily.   ATORVASTATIN  (LIPITOR) 10 MG TABLET    Take 1 tablet (10 mg total) by mouth daily.   CALCIUM  CARB-CHOLECALCIFEROL  (CALCIUM  CARBONATE-VITAMIN D3 PO)    Take 2 tablets by mouth daily. 600-400 mg   CARBOXYMETHYLCELLULOSE SODIUM (REFRESH LIQUIGEL) 1 % GEL    1 gtt OS only daily   CLOPIDOGREL  (PLAVIX ) 75 MG TABLET    Take 75 mg by mouth daily.   CYCLOBENZAPRINE  (FEXMID ) 7.5 MG TABLET    Take 15 mg by mouth at bedtime.   ESCITALOPRAM  (LEXAPRO ) 5 MG TABLET    Take 5 mg by mouth daily.   FERROUS FUMARATE  (FERRETTS) 325 (106 FE) MG TABS TABLET    Take 1 tablet by mouth.   GABAPENTIN  (NEURONTIN ) 100 MG CAPSULE    Take 100 mg by mouth 3 (three) times daily.   IBUPROFEN  (ADVIL ) 400 MG TABLET    Take 1 tablet (400 mg total) by mouth every 6 (six) hours as needed for mild pain or moderate pain.   LEPTOSPERMUM MANUKA HONEY (MEDIHONEY) PSTE PASTE  Apply 1 Application topically daily.   MIRTAZAPINE  (REMERON ) 7.5 MG TABLET    Take 7.5 mg by mouth at bedtime.   MULTIPLE VITAMIN (MULTIVITAMIN) TABLET    Take 1 tablet by mouth daily.   OXYCODONE  10 MG TABS    Take 1-1.5 tablets (10-15 mg total) by mouth every 4 (four) hours as needed for severe pain or moderate pain.   POLYETHYL GLYCOL-PROPYL GLYCOL (SYSTANE) 0.4-0.3 % SOLN    Apply to eye.   POTASSIUM CHLORIDE  SA (KLOR-CON  M) 20 MEQ TABLET    Take 2 tablets (40 mEq total) by mouth daily.   SENNA-DOCUSATE (SENOKOT-S) 8.6-50 MG TABLET    Take 1 tablet by mouth daily.   SOFOSBUVIR -VELPATASVIR  (EPCLUSA ) 400-100 MG TABS    Take 1 tablet by mouth daily.   TAMSULOSIN  (FLOMAX ) 0.4 MG CAPS CAPSULE    Take 1 capsule (0.4  mg total) by mouth daily after supper.   TRAMADOL  (ULTRAM ) 50 MG TABLET    Take 1 tablet (50 mg total) by mouth every 6 (six) hours as needed.  Modified Medications   No medications on file  Discontinued Medications   No medications on file    Labs: Hepatitis C Lab Results  Component Value Date   HCVGENOTYPE 1a 07/01/2023   HCVRNAPCRQN 1,380,000 (H) 06/16/2023   FIBROSTAGE F0 07/01/2023   Hepatitis B Lab Results  Component Value Date   HEPBSAB NON-REACTIVE 07/01/2023   HEPBSAG NON-REACTIVE 07/01/2023   HEPBCAB NON-REACTIVE 07/01/2023   Hepatitis A Lab Results  Component Value Date   HAV NON-REACTIVE 07/01/2023   HIV Lab Results  Component Value Date   HIV Non Reactive 02/26/2023   HIV Non Reactive 04/25/2018   Lab Results  Component Value Date   CREATININE 1.07 07/01/2023   CREATININE 0.84 06/16/2023   CREATININE 0.75 05/15/2023   CREATININE 0.84 03/31/2023   CREATININE 0.69 03/06/2023   Lab Results  Component Value Date   AST 17 07/01/2023   AST 25 05/15/2023   AST 30 03/31/2023   ALT 8 (L) 07/01/2023   ALT 10 07/01/2023   ALT 13 05/15/2023   INR 1.0 07/01/2023   INR 1.2 02/26/2023   INR 1.01 06/17/2018    Assessment: Chad [name] presents today for 1 month follow up on Epclusa  for treatment of Hepatitis C. He reports starting Epclusa  on 07/16/23. He has no concerns at this time. Notable drug interaction with atorvastatin , requiring monitoring for myopathies while taking concomitantly. He reports no concerns for this.  Immunizations: Due for Hepatitis A, Hepatitis B, COVID, Tdap, and Shingles vaccine. Prioritize Hepatitis A/B series right now. Patient *** these today.   Plan: - HCV RNA - Havrix 1/2, Heplisav-B  1/2 administered in office today - HAV 2/2 in 6 months, HBV 2/2 at next visit  - End of treatment follow up on *** with Dr. Dea ????  Con Laughter, PharmD PGY-2 Infectious Diseases Pharmacy Resident [facility]

## 2023-08-11 ENCOUNTER — Ambulatory Visit (INDEPENDENT_AMBULATORY_CARE_PROVIDER_SITE_OTHER): Payer: Medicaid Other | Admitting: Pharmacist

## 2023-08-11 ENCOUNTER — Other Ambulatory Visit: Payer: Self-pay

## 2023-08-11 DIAGNOSIS — Z23 Encounter for immunization: Secondary | ICD-10-CM

## 2023-08-11 DIAGNOSIS — B182 Chronic viral hepatitis C: Secondary | ICD-10-CM | POA: Diagnosis present

## 2023-08-11 NOTE — Progress Notes (Signed)
 HPI: Chad [name] is a 63 y.o. male who presents to the [facility] pharmacy clinic for Hepatitis C follow-up.  Medication: Sofosbuvir -Velpatasvir  (EPCLUSA ) 400-100 MG 1 tablet once daily  Start Date: 07/16/2023  Hepatitis C Genotype: 1a (05/2018)  Fibrosis Score: 0.18 (07/01/23)  Hepatitis C RNA: 1,380,000 06/16/23  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   Genetic testing 06/15/2019   Swelling of limb 04/15/2019   History of colon cancer 03/24/2019   Family history of prostate cancer    Family history of colon cancer    Family history of leukemia    Family history of kidney cancer    Goals of care, counseling/discussion 12/25/2018   Chronic pain syndrome 11/18/2018   Peripheral vascular disease of lower extremity with ulceration (HCC) 10/04/2018   Neoplasm related pain 09/08/2018   Poor appetite 09/08/2018   Weakness generalized 07/02/2018   Anorexia 07/02/2018   Chronic pain due to neoplasm 07/02/2018   Iron  deficiency anemia due to chronic blood loss 05/27/2018   Ureteral cancer, 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    AMINO ACIDS-PROTEIN HYDROLYS (PRO-STAT) LIQD    Take 45 mLs by mouth daily.   ASCORBIC ACID  (VITAMIN C ) 500 MG TABLET    Take 1 tablet (500 mg total) by mouth 2 (two) times daily.   ASPIRIN  EC 81 MG TABLET    Take 1 tablet (81 mg total) by mouth daily.   ATORVASTATIN  (LIPITOR) 10 MG TABLET    Take 1 tablet (10 mg total) by mouth daily.   CALCIUM  CARB-CHOLECALCIFEROL  (CALCIUM  CARBONATE-VITAMIN D3 PO)    Take 2 tablets by mouth daily. 600-400 mg   CARBOXYMETHYLCELLULOSE SODIUM (REFRESH LIQUIGEL) 1 % GEL    1 gtt OS only daily   CLOPIDOGREL  (PLAVIX ) 75 MG TABLET    Take 75 mg by mouth daily.   CYCLOBENZAPRINE  (FEXMID ) 7.5 MG TABLET    Take 15 mg by mouth at bedtime.   ESCITALOPRAM  (LEXAPRO ) 5 MG TABLET    Take 5 mg by mouth daily.   FERROUS FUMARATE  (FERRETTS) 325 (106 FE) MG TABS TABLET    Take 1 tablet by mouth.   GABAPENTIN  (NEURONTIN ) 100 MG CAPSULE    Take 100 mg by mouth 3 (three) times daily.   IBUPROFEN  (ADVIL ) 400 MG TABLET    Take 1 tablet (400 mg total) by mouth every 6 (six) hours as needed for mild pain or moderate pain.   LEPTOSPERMUM MANUKA HONEY (MEDIHONEY) PSTE PASTE  Apply 1 Application topically daily.   MIRTAZAPINE  (REMERON ) 7.5 MG TABLET    Take 7.5 mg by mouth at bedtime.   MULTIPLE VITAMIN (MULTIVITAMIN) TABLET    Take 1 tablet by mouth daily.   OXYCODONE  10 MG TABS    Take 1-1.5 tablets (10-15 mg total) by mouth every 4 (four) hours as needed for severe pain or moderate pain.   POLYETHYL GLYCOL-PROPYL GLYCOL (SYSTANE) 0.4-0.3 % SOLN    Apply to eye.   POTASSIUM CHLORIDE  SA (KLOR-CON  M) 20 MEQ TABLET    Take 2 tablets (40 mEq total) by mouth daily.   SENNA-DOCUSATE (SENOKOT-S) 8.6-50 MG TABLET    Take 1 tablet by mouth daily.   SOFOSBUVIR -VELPATASVIR  (EPCLUSA ) 400-100 MG TABS    Take 1 tablet by mouth daily.   TAMSULOSIN  (FLOMAX ) 0.4 MG CAPS  CAPSULE    Take 1 capsule (0.4 mg total) by mouth daily after supper.   TRAMADOL  (ULTRAM ) 50 MG TABLET    Take 1 tablet (50 mg total) by mouth every 6 (six) hours as needed.  Modified Medications   No medications on file  Discontinued Medications   No medications on file    Labs: Hepatitis C Lab Results  Component Value Date   HCVGENOTYPE 1a 07/01/2023   HCVRNAPCRQN 1,380,000 (H) 06/16/2023   FIBROSTAGE F0 07/01/2023   Hepatitis B Lab Results  Component Value Date   HEPBSAB NON-REACTIVE 07/01/2023   HEPBSAG NON-REACTIVE 07/01/2023   HEPBCAB NON-REACTIVE 07/01/2023   Hepatitis A Lab Results  Component Value Date   HAV NON-REACTIVE 07/01/2023   HIV Lab Results  Component Value Date   HIV Non Reactive 02/26/2023   HIV Non Reactive 04/25/2018   Lab Results  Component Value Date   CREATININE 1.07 07/01/2023   CREATININE 0.84 06/16/2023   CREATININE 0.75 05/15/2023   CREATININE 0.84 03/31/2023   CREATININE 0.69 03/06/2023   Lab Results  Component Value Date   AST 17 07/01/2023   AST 25 05/15/2023   AST 30 03/31/2023   ALT 8 (L) 07/01/2023   ALT 10 07/01/2023   ALT 13 05/15/2023   INR 1.0 07/01/2023   INR 1.2 02/26/2023   INR 1.01 06/17/2018    Assessment: Chad [name] presents to [facility] pharmacy today for his 1 month follow up on his chronic HCV treatment with Sofosbuvir -Velpatasvir  (EPCLUSA ) 400-100 mg 1 tablet once daily x12 weeks. Patient tolerates current treatment regimen and denies adverse drug reactions (e.g., headache, fatigue, rash, nausea). Reports starting Epclusa  on 07/16/2023.   Epclusa  Start Date: 07/16/2023 Epclusa  End Date: 10/08/2023  Vaccination: Patient's HepA and Hep B Ab from 07/01/23 were non-reactive, necessitating vaccination for both HepA and HepB. Patient is also due for their COVID-19 vaccine, Tdap, and RZV. Will defer COVID-19, Tdap, and RZV vaccines on next follow-up visit to prioritize HepA and HepB vaccines today.   Potential DDI with  Eplcusa: atorvastatin  10 mg every day. Patient denied s/sx of myopathies.   Plan: - Collect HCV RNA today - Administer HepA 1/2 and HepB 1/2 vaccines - Next Hep A vaccine due on February 07, 2024 - Give HepB vaccine due on October 15, 2023 with next follow visit - Administer COVID-19, Tdap, and RZV vaccines on next follow-up visit - Next follow up visit with Dr. Dea on 10/15/23 - Call for any questions or concerns  Chad [name], PharmD Candidate [facility] [employer] 08/11/2023, 1:58 PM

## 2023-08-12 NOTE — Progress Notes (Addendum)
 ESGAR, BARNICK ([id number]) [id number] Page 1 of 15 Visit Report for 08/10/2023 Arrival Information Details Patient Name: Date of Service: Chad [name], BRATHWAITE 08/10/2023 9:15 A M Medical Record Number: [id number] Patient Account Number: [id number] Date of Birth/Sex: Treating RN: 1961/05/27 (63 y.o. [name] Primary Care Siomara Burkel: Gerlean Maier Other Clinician: Referring Mckinleigh Schuchart: Treating Jakyron Fabro/Extender: Bethena Andre Gerlean Maier Devra in Treatment: 44 Visit Information History Since Last Visit Added or deleted any medications: No Patient Arrived: Wheel Chair Any new allergies or adverse reactions: No Arrival Time: 09:33 Had a fall or experienced change in No Accompanied By: self activities of daily living that may affect Transfer Assistance: EasyPivot Patient Lift risk of falls: Patient Identification Verified: Yes Signs or symptoms of abuse/neglect since last visito No Secondary Verification Process Completed: Yes Hospitalized since last visit: No Patient Requires Transmission-Based No Has Dressing in Place as Prescribed: Yes Precautions: Pain Present Now: No Patient Has Alerts: Yes Patient Alerts: Patient on Blood Thinner NOT diabetic aspirin  81mg  Lives [facility] ABI R 0.81 11/05/22 ABI L 0.59 11/05/22 [facility] 03/03/23 Electronic Signature(s) Signed: 08/10/2023 4:49:55 PM By: [name] MSN RN CNS WTA Entered By: [name] on 08/10/2023 09:39:49 -------------------------------------------------------------------------------- Clinic Level of Care Assessment Details Patient Name: Date of Service: Chad [name] 08/10/2023 9:15 A M Medical Record Number: [id number] Patient Account Number: [id number] Date of Birth/Sex: Treating RN: [birth year] (63 y.o. [name] Primary Care Anjani Feuerborn: Gerlean Maier Other Clinician: Referring Latanza Pfefferkorn: Treating Tawona Filsinger/Extender: Bethena Andre Gerlean Maier Weeks in Treatment: 87 Clinic Level  of Care Assessment Items TOOL 4 Quantity Score X- 1 0 Use when only an EandM is performed on FOLLOW-UP visit ASSESSMENTS - Nursing Assessment / Reassessment CALYB, MCQUARRIE ([id number]) [id number] Page 2 of 15 X- 1 10 Reassessment of Co-morbidities (includes updates in patient status) X- 1 5 Reassessment of Adherence to Treatment Plan ASSESSMENTS - Wound and Skin A ssessment / Reassessment []  - 0 Simple Wound Assessment / Reassessment - one wound X- 5 5 Complex Wound Assessment / Reassessment - multiple wounds []  - 0 Dermatologic / Skin Assessment (not related to wound area) ASSESSMENTS - Focused Assessment []  - 0 Circumferential Edema Measurements - multi extremities []  - 0 Nutritional Assessment / Counseling / Intervention []  - 0 Lower Extremity Assessment (monofilament, tuning fork, pulses) []  - 0 Peripheral Arterial Disease Assessment (using hand held doppler) ASSESSMENTS - Ostomy and/or Continence Assessment and Care []  - 0 Incontinence Assessment and Management []  - 0 Ostomy Care Assessment and Management (repouching, etc.) PROCESS - Coordination of Care []  - 0 Simple Patient / Family Education for ongoing care X- 1 20 Complex (extensive) Patient / Family Education for ongoing care X- 1 10 Staff obtains Chiropractor, Records, T Results / Process Orders est []  - 0 Staff telephones HHA, Nursing Homes / Clarify orders / etc []  - 0 Routine Transfer to another Facility (non-emergent condition) []  - 0 Routine Hospital Admission (non-emergent condition) []  - 0 New Admissions / Manufacturing Engineer / Ordering NPWT Apligraf, etc. , []  - 0 Emergency Hospital Admission (emergent condition) []  - 0 Simple Discharge Coordination X- 1 15 Complex (extensive) Discharge Coordination PROCESS - Special Needs []  - 0 Pediatric / Minor Patient Management []  - 0 Isolation Patient Management []  - 0 Hearing / Language / Visual special needs []  -  0 Assessment of Community assistance (transportation, D/C planning, etc.) []  - 0 Additional assistance / Altered mentation []  - 0 Support Surface(s) Assessment (bed, cushion, seat, etc.) INTERVENTIONS - Wound Cleansing /  Measurement []  - 0 Simple Wound Cleansing - one wound X- 5 5 Complex Wound Cleansing - multiple wounds X- 1 5 Wound Imaging (photographs - any number of wounds) []  - 0 Wound Tracing (instead of photographs) []  - 0 Simple Wound Measurement - one wound X- 5 5 Complex Wound Measurement - multiple wounds INTERVENTIONS - Wound Dressings []  - 0 Small Wound Dressing one or multiple wounds X- 2 15 Medium Wound Dressing one or multiple wounds X- 3 20 Large Wound Dressing one or multiple wounds []  - 0 Application of Medications - topical []  - 0 Application of Medications - injection Sircy, Anterrio ([id number]) [id number] Page 3 of 15 INTERVENTIONS - Miscellaneous []  - 0 External ear exam []  - 0 Specimen Collection (cultures, biopsies, blood, body fluids, etc.) []  - 0 Specimen(s) / Culture(s) sent or taken to Lab for analysis []  - 0 Patient Transfer (multiple staff / Deitra Lift / Similar devices) []  - 0 Simple Staple / Suture removal (25 or less) []  - 0 Complex Staple / Suture removal (26 or more) []  - 0 Hypo / Hyperglycemic Management (close monitor of Blood Glucose) []  - 0 Ankle / Brachial Index (ABI) - do not check if billed separately X- 1 5 Vital Signs Has the patient been seen at the hospital within the last three years: Yes Total Score: 235 Level Of Care: New/Established - Level 5 Electronic Signature(s) Signed: 08/10/2023 4:49:55 PM By: [name] MSN RN CNS WTA Entered By: [name] on 08/10/2023 10:45:32 -------------------------------------------------------------------------------- Encounter Discharge Information Details Patient Name: Date of Service: Chad [name] 08/10/2023 9:15 A M Medical Record  Number: [id number] Patient Account Number: [id number] Date of Birth/Sex: Treating RN: 08-Jul-1961 (63 y.o. [name] Primary Care Patryck Kilgore: Gerlean Maier Other Clinician: Referring Montrice Gracey: Treating Avina Eberle/Extender: Bethena Andre Gerlean Maier Devra in Treatment: 31 Encounter Discharge Information Items Discharge Condition: Stable Ambulatory Status: Walker Discharge Destination: Home Transportation: Other Accompanied By: self Schedule Follow-up Appointment: Yes Clinical Summary of Care: Electronic Signature(s) Signed: 08/10/2023 1:50:24 PM By: [name] MSN RN CNS WTA Entered By: [name] on 08/10/2023 13:50:24 Chad [name] ([id number]) [id number] Page 4 of 15 -------------------------------------------------------------------------------- Lower Extremity Assessment Details Patient Name: Date of Service: PROMISE, BUSHONG 08/10/2023 9:15 A M Medical Record Number: [id number] Patient Account Number: [id number] Date of Birth/Sex: Treating RN: May 09, 1961 (63 y.o. [name] Primary Care Lakai Moree: Gerlean Maier Other Clinician: Referring Auston Halfmann: Treating Yeraldine Forney/Extender: Bethena Andre Gerlean Maier Weeks in Treatment: 87 Edema Assessment Assessed: [Left: No] [Right: No] [Left: Edema] [Right: :] Calf Left: Right: Point of Measurement: 33 cm From Medial Instep 42 cm Ankle Left: Right: Point of Measurement: 12 cm From Medial Instep 26 cm Vascular Assessment Pulses: Dorsalis Pedis Palpable: [Right:No] Extremity colors, hair growth, and conditions: Extremity Color: [Right:Red] Hair Growth on Extremity: [Right:No] Temperature of Extremity: [Right:Warm] Capillary Refill: [Right:> 3 seconds] Dependent Rubor: [Right:No] Blanched when Elevated: [Right:No No] Toe Nail Assessment Left: Right: Thick: Yes Discolored: Yes Deformed: Yes Improper Length and Hygiene: Yes Electronic Signature(s) Signed: 08/10/2023 4:49:55 PM By: [name] MSN RN CNS WTA Entered By: [name] on 08/10/2023 10:05:55 -------------------------------------------------------------------------------- Multi Wound Chart Details Patient Name: Date of Service: Chad [name] 08/10/2023 9:15 A M Medical Record Number: [id number] Patient Account Number: [id number] Date of Birth/Sex: Treating RN: 1961/02/14 (63 y.o. [name] Primary Care Camreigh Michie: Gerlean Maier Other Clinician: Referring Alvon Nygaard: Treating Ailis Rigaud/Extender: Bethena Andre Gerlean Maier Weeks in Treatment: 96 Vital Signs Height(in): 69 Pulse(bpm): 80 Weight(lbs): 150 Blood Pressure(mmHg):  99/67 Chad [name] ([id number]) [phone].pdf Page 5 of 15 Body Mass Index(BMI): 22.1 Temperature(F): 97.9 Respiratory Rate(breaths/min): 18 [16:Photos:] Right Calcaneus Right, Medial T Second oe Right, Dorsal Foot Wound Location: Gradually Appeared Pressure Injury Pressure Injury Wounding Event: Pressure Ulcer Pressure Ulcer Venous Leg Ulcer Primary Etiology: Anemia, Chronic Obstructive Anemia, Chronic Obstructive Anemia, Chronic Obstructive Comorbid History: Pulmonary Disease (COPD), Coronary Pulmonary Disease (COPD), Coronary Pulmonary Disease (COPD), Coronary Artery Disease, Peripheral Arterial Artery Disease, Peripheral Arterial Artery Disease, Peripheral Arterial Disease, Peripheral Venous Disease, Disease, Peripheral Venous Disease, Disease, Peripheral Venous Disease, Hepatitis C, Osteoarthritis, Hepatitis C, Osteoarthritis, Hepatitis C, Osteoarthritis, Neuropathy, Received Chemotherapy Neuropathy, Received Chemotherapy Neuropathy, Received Chemotherapy 09/26/2022 09/26/2022 03/02/2023 Date Acquired: 43 42 19 Weeks of Treatment: Open Open Open Wound Status: No No No Wound Recurrence: Yes No Yes Clustered Wound: 12x11x0.2 2.5x2.2x0.2 5x5.3x0.2 Measurements L x W x D (cm) 103.673 4.32 20.813 A (cm) : rea 20.735 0.864 4.163 Volume (cm)  : -10905.60% -685.50% -1077.90% % Reduction in Area: -10929.30% -1470.90% -2252.00% % Reduction in Volume: Category/Stage III Category/Stage III Full Thickness Without Exposed Classification: Support Structures Medium Medium Medium Exudate Amount: Serosanguineous Serosanguineous Serous Exudate Type: red, brown red, brown amber Exudate Color: N/A N/A N/A Necrotic Amount: Wound Number: 22 23 N/A Photos: N/A Right, Anterior Lower Leg Right, Posterior Lower Leg N/A Wound Location: Pressure Injury Pressure Injury N/A Wounding Event: Venous Leg Ulcer Pressure Ulcer N/A Primary Etiology: Anemia, Chronic Obstructive Anemia, Chronic Obstructive N/A Comorbid History: Pulmonary Disease (COPD), Coronary Pulmonary Disease (COPD), Coronary Artery Disease, Peripheral Arterial Artery Disease, Peripheral Arterial Disease, Peripheral Venous Disease, Disease, Peripheral Venous Disease, Hepatitis C, Osteoarthritis, Hepatitis C, Osteoarthritis, Neuropathy, Received Chemotherapy Neuropathy, Received Chemotherapy 03/02/2023 05/02/2023 N/A Date Acquired: 27 13 N/A Weeks of Treatment: Open Open N/A Wound Status: No No N/A Wound Recurrence: Yes Yes N/A Clustered Wound: 13x11x0.2 7.5x9x0.2 N/A Measurements L x W x D (cm) 112.312 53.014 N/A A (cm) : rea 22.462 10.603 N/A Volume (cm) : -4667.10% -237.50% N/A % Reduction in Area: -4669.00% -574.90% N/A % Reduction in Volume: Full Thickness Without Exposed Category/Stage III N/A Classification: Support Structures Medium Medium N/A Exudate Amount: Serous Serosanguineous N/A Exudate Type: amber red, brown N/A Exudate Color: N/A N/A N/A Necrotic Amount: Treatment Notes KENYON, EICHELBERGER ([id number]) [id number] Page 6 of 15 Electronic Signature(s) Signed: 08/10/2023 4:49:55 PM By: [name] MSN RN CNS WTA Entered By: [name] on 08/10/2023  10:41:33 -------------------------------------------------------------------------------- Multi-Disciplinary Care Plan Details Patient Name: Date of Service: KEJON, FEILD [name] 08/10/2023 9:15 A M Medical Record Number: [id number] Patient Account Number: [id number] Date of Birth/Sex: Treating RN: Aug 17, 1960 (63 y.o. [name] Primary Care Malak Duchesneau: Gerlean Maier Other Clinician: Referring Meghana Tullo: Treating Jemima Petko/Extender: Bethena Andre Gerlean Maier Weeks in Treatment: 16 Active Inactive Venous Leg Ulcer Nursing Diagnoses: Knowledge deficit related to disease process and management Goals: Patient will maintain optimal edema control Date Initiated: 12/06/2021 Target Resolution Date: 07/26/2023 Goal Status: Active Patient/caregiver will verbalize understanding of disease process and disease management Date Initiated: 12/06/2021 Date Inactivated: 12/19/2022 Target Resolution Date: 11/27/2022 Goal Status: Met Interventions: Assess peripheral edema status every visit. Compression as ordered Notes: Electronic Signature(s) Signed: 08/10/2023 4:49:55 PM By: [name] MSN RN CNS WTA Entered By: [name] on 08/10/2023 10:49:13 -------------------------------------------------------------------------------- Pain Assessment Details Patient Name: Date of Service: TYKEL, BADIE 08/10/2023 9:15 A M Medical Record Number: [id number] Patient Account Number: [id number] Date of Birth/Sex: Treating RN: 10-Jan-1961 (63 y.o. [name] Primary Care Sussie Minor: Gerlean Maier Other Clinician: Referring Avyukth Bontempo: Treating Estephanie Hubbs/Extender:  Stone, Andre Gerlean Maier Weeks in Treatment: [address] [place] ([id number]) [id number] Page 7 of 15 Active Problems Location of Pain Severity and Description of Pain Patient Has Paino Yes Site Locations Rate the pain. Current Pain Level: 10 Pain Management and Medication Current Pain Management: Electronic  Signature(s) Signed: 08/10/2023 4:49:55 PM By: [name] MSN RN CNS WTA Entered By: [name] on 08/10/2023 09:57:16 -------------------------------------------------------------------------------- Patient/Caregiver Education Details Patient Name: Date of Service: Chad [name] 1/13/2025andnbsp9:15 A M Medical Record Number: [id number] Patient Account Number: [id number] Date of Birth/Gender: Treating RN: 25-May-1961 (63 y.o. [name] Primary Care Physician: Gerlean Maier Other Clinician: Referring Physician: Treating Physician/Extender: Bethena Andre Gerlean Maier Devra in Treatment: 67 Education Assessment Education Provided To: Patient Education Topics Provided Wound/Skin Impairment: Handouts: Caring for Your Ulcer Methods: Explain/Verbal Responses: State content correctly Electronic Signature(s) Signed: 08/10/2023 4:49:55 PM By: [name] MSN RN CNS WTA Entered By: [name] on 08/10/2023 10:51:48 Chad [name] ([id number]) [id number] Page 8 of 15 -------------------------------------------------------------------------------- Wound Assessment Details Patient Name: Date of Service: Chad [name], ALVERIO 08/10/2023 9:15 A M Medical Record Number: [id number] Patient Account Number: [id number] Date of Birth/Sex: Treating RN: 07-21-1961 (63 y.o. [name] Primary Care Khila Papp: Gerlean Maier Other Clinician: Referring Arlis Everly: Treating Domingue Coltrain/Extender: Bethena Andre Gerlean Maier Weeks in Treatment: 87 Wound Status Wound Number: 16 Primary Pressure Ulcer Etiology: Wound Location: Right Calcaneus Wound Open Wounding Event: Gradually Appeared Status: Date Acquired: 09/26/2022 Comorbid Anemia, Chronic Obstructive Pulmonary Disease (COPD), Coronary Weeks Of Treatment: 43 History: Artery Disease, Peripheral Arterial Disease, Peripheral Venous Clustered Wound: Yes Disease, Hepatitis C, Osteoarthritis, Neuropathy,  Received Chemotherapy Photos Wound Measurements Length: (cm) 12 Width: (cm) 11 Depth: (cm) 0.2 Area: (cm) 103.673 Volume: (cm) 20.735 % Reduction in Area: -10905.6% % Reduction in Volume: -10929.3% Wound Description Classification: Category/Stage III Exudate Amount: Medium Exudate Type: Serosanguineous Exudate Color: red, brown Wound Bed Necrotic Amount: Medium (34-66%) Necrotic Quality: Adherent Slough Treatment Notes Wound #16 (Calcaneus) Wound Laterality: Right Cleanser Soap and Water  Discharge Instruction: Gently cleanse wound with antibacterial soap, rinse and pat dry prior to dressing wounds Wound Cleanser Discharge Instruction: Wash your hands with soap and water . Remove old dressing, discard into plastic bag and place into trash. Cleanse the wound with Wound Cleanser prior to applying a clean dressing using gauze sponges, not tissues or cotton balls. Do not scrub or use excessive force. Pat dry using gauze sponges, not tissue or cotton balls. JASPREET, HOLLINGS ([id number]) [id number] Page 9 of 15 Peri-Wound Care Topical Primary Dressing Secondary Dressing ABD Pad 5x9 (in/in) Discharge Instruction: Cover with ABD pad Heel Cup Secured With Kerlix Roll Sterile or Non-Sterile 6-ply 4.5x4 (yd/yd) Discharge Instruction: Apply Kerlix as directed Tubigrip Size D, 3x10 (in/yd) Compression Wrap Compression Stockings Add-Ons Electronic Signature(s) Signed: 08/10/2023 4:49:55 PM By: [name] MSN RN CNS WTA Entered By: [name] on 08/10/2023 10:03:39 -------------------------------------------------------------------------------- Wound Assessment Details Patient Name: Date of Service: Chad [name] 08/10/2023 9:15 A M Medical Record Number: [id number] Patient Account Number: [id number] Date of Birth/Sex: Treating RN: 02/02/1961 (63 y.o. [name] Primary Care Erich Kochan: Gerlean Maier Other Clinician: Referring Aero Drummonds: Treating  Arden Axon/Extender: Bethena Andre Gerlean Maier Weeks in Treatment: 87 Wound Status Wound Number: 18 Primary Pressure Ulcer Etiology: Wound Location: Right, Medial T Second oe Wound Open Wounding Event: Pressure Injury Status: Date Acquired: 09/26/2022 Comorbid Anemia, Chronic Obstructive Pulmonary Disease (COPD), Coronary Weeks Of Treatment: 42 History: Artery Disease, Peripheral Arterial Disease, Peripheral Venous Clustered Wound: No Disease, Hepatitis C, Osteoarthritis, Neuropathy,  Received Chemotherapy Photos Wound Measurements Length: (cm) 2.5 Width: (cm) 2.2 Depth: (cm) 0.2 Janicki, Keyler ([id number]) Area: (cm) 4.32 Volume: (cm) 0.864 % Reduction in Area: -685.5% % Reduction in Volume: -1470.9% [id number] Page 10 of 15 Wound Description Classification: Category/Stage III Exudate Amount: Medium Exudate Type: Serosanguineous Exudate Color: red, brown Wound Bed Necrotic Amount: Medium (34-66%) Necrotic Quality: Adherent Slough Treatment Notes Wound #18 (Toe Second) Wound Laterality: Right, Medial Cleanser Soap and Water  Discharge Instruction: Gently cleanse wound with antibacterial soap, rinse and pat dry prior to dressing wounds Wound Cleanser Discharge Instruction: Wash your hands with soap and water . Remove old dressing, discard into plastic bag and place into trash. Cleanse the wound with Wound Cleanser prior to applying a clean dressing using gauze sponges, not tissues or cotton balls. Do not scrub or use excessive force. Pat dry using gauze sponges, not tissue or cotton balls. Peri-Wound Care Topical Primary Dressing Secondary Dressing ABD Pad 5x9 (in/in) Discharge Instruction: Cover with ABD pad Heel Cup Secured With Kerlix Roll Sterile or Non-Sterile 6-ply 4.5x4 (yd/yd) Discharge Instruction: Apply Kerlix as directed Tubigrip Size D, 3x10 (in/yd) Compression Wrap Compression Stockings Add-Ons Electronic  Signature(s) Signed: 08/10/2023 4:49:55 PM By: [name] MSN RN CNS WTA Entered By: [name] on 08/10/2023 10:04:03 -------------------------------------------------------------------------------- Wound Assessment Details Patient Name: Date of Service: Chad [name] 08/10/2023 9:15 A M Medical Record Number: [id number] Patient Account Number: [id number] Date of Birth/Sex: Treating RN: 1961-06-16 (63 y.o. [name] Primary Care Inocencio Roy: Gerlean Maier Other Clinician: Referring Bettyjane Shenoy: Treating Jonnette Nuon/Extender: Bethena Andre Gerlean Maier Weeks in Treatment: 87 Wound Status Wound Number: 21 Primary Venous Leg Ulcer Etiology: MARSH, HECKLER ([id number]) [phone].pdf Page 11 of 15 Etiology: Wound Location: Right, Dorsal Foot Wound Open Wounding Event: Pressure Injury Status: Date Acquired: 03/02/2023 Comorbid Anemia, Chronic Obstructive Pulmonary Disease (COPD), Coronary Weeks Of Treatment: 19 History: Artery Disease, Peripheral Arterial Disease, Peripheral Venous Clustered Wound: Yes Disease, Hepatitis C, Osteoarthritis, Neuropathy, Received Chemotherapy Photos Wound Measurements Length: (cm) 5 Width: (cm) 5.3 Depth: (cm) 0.2 Area: (cm) 20.813 Volume: (cm) 4.163 % Reduction in Area: -1077.9% % Reduction in Volume: -2252% Wound Description Classification: Full Thickness Without Exposed Suppor Exudate Amount: Medium Exudate Type: Serous Exudate Color: amber t Structures Wound Bed Necrotic Amount: Medium (34-66%) Necrotic Quality: Adherent Slough Treatment Notes Wound #21 (Foot) Wound Laterality: Dorsal, Right Cleanser Soap and Water  Discharge Instruction: Gently cleanse wound with antibacterial soap, rinse and pat dry prior to dressing wounds Wound Cleanser Discharge Instruction: Wash your hands with soap and water . Remove old dressing, discard into plastic bag and place into trash. Cleanse the wound with Wound Cleanser prior to  applying a clean dressing using gauze sponges, not tissues or cotton balls. Do not scrub or use excessive force. Pat dry using gauze sponges, not tissue or cotton balls. Peri-Wound Care Topical Primary Dressing Secondary Dressing ABD Pad 5x9 (in/in) Discharge Instruction: Cover with ABD pad Heel Cup Secured With Kerlix Roll Sterile or Non-Sterile 6-ply 4.5x4 (yd/yd) Discharge Instruction: Apply Kerlix as directed Tubigrip Size D, 3x10 (in/yd) Compression Wrap Compression Stockings Add-Ons Electronic Signature(s) Signed: 08/10/2023 4:49:55 PM By: [name] MSN RN CNS WTA Entered By: [name] on 08/10/2023 10:04:45 Chad [name] ([id number]) [id number] Page 12 of 15 -------------------------------------------------------------------------------- Wound Assessment Details Patient Name: Date of Service: Chad [name], MOCHIZUKI 08/10/2023 9:15 A M Medical Record Number: [id number] Patient Account Number: [id number] Date of Birth/Sex: Treating RN: 03/13/1961 (63 y.o. [name] Primary Care Khairi Garman: Gerlean Maier Other Clinician: Referring [place]: Treating  Eyvette Cordon/Extender: Bethena Andre Gerlean Sallyann Weeks in Treatment: 87 Wound Status Wound Number: 22 Primary Venous Leg Ulcer Etiology: Wound Location: Right, Anterior Lower Leg Wound Open Wounding Event: Pressure Injury Status: Date Acquired: 03/02/2023 Comorbid Anemia, Chronic Obstructive Pulmonary Disease (COPD), Coronary Weeks Of Treatment: 19 History: Artery Disease, Peripheral Arterial Disease, Peripheral Venous Clustered Wound: Yes Disease, Hepatitis C, Osteoarthritis, Neuropathy, Received Chemotherapy Photos Wound Measurements Length: (cm) 13 Width: (cm) 11 Depth: (cm) 0.2 Area: (cm) 112.312 Volume: (cm) 22.462 % Reduction in Area: -4667.1% % Reduction in Volume: -4669% Wound Description Classification: Full Thickness Without Exposed Support Exudate Amount: Medium Exudate Type:  Serous Exudate Color: amber Structures Treatment Notes Wound #22 (Lower Leg) Wound Laterality: Right, Anterior Cleanser Soap and Water  Discharge Instruction: Gently cleanse wound with antibacterial soap, rinse and pat dry prior to dressing wounds Wound Cleanser Discharge Instruction: Wash your hands with soap and water . Remove old dressing, discard into plastic bag and place into trash. Cleanse the wound with Wound Cleanser prior to applying a clean dressing using gauze sponges, not tissues or cotton balls. Do not scrub or use excessive force. Pat dry using gauze sponges, not tissue or cotton balls. Peri-Wound Care YOVAN, LEEMAN ([id number]) [id number] Page 13 of 15 Topical Primary Dressing Secondary Dressing ABD Pad 5x9 (in/in) Discharge Instruction: Cover with ABD pad Heel Cup Secured With Kerlix Roll Sterile or Non-Sterile 6-ply 4.5x4 (yd/yd) Discharge Instruction: Apply Kerlix as directed Tubigrip Size D, 3x10 (in/yd) Compression Wrap Compression Stockings Add-Ons Electronic Signature(s) Signed: 08/10/2023 4:49:55 PM By: [name] MSN RN CNS WTA Entered By: [name] on 08/10/2023 10:04:27 -------------------------------------------------------------------------------- Wound Assessment Details Patient Name: Date of Service: Chad [name] 08/10/2023 9:15 A M Medical Record Number: [id number] Patient Account Number: [id number] Date of Birth/Sex: Treating RN: 02-19-1961 (63 y.o. [name] Primary Care Houa Ackert: Gerlean Sallyann Other Clinician: Referring Enedelia Martorelli: Treating Shoshanah Dapper/Extender: Bethena Andre Gerlean Sallyann Weeks in Treatment: 87 Wound Status Wound Number: 23 Primary Pressure Ulcer Etiology: Wound Location: Right, Posterior Lower Leg Wound Open Wounding Event: Pressure Injury Status: Date Acquired: 05/02/2023 Comorbid Anemia, Chronic Obstructive Pulmonary Disease (COPD), Coronary Weeks Of Treatment: 13 History: Artery  Disease, Peripheral Arterial Disease, Peripheral Venous Clustered Wound: Yes Disease, Hepatitis C, Osteoarthritis, Neuropathy, Received Chemotherapy Photos Wound Measurements Length: (cm) 7.5 Width: (cm) 9 Depth: (cm) 0.2 Area: (cm) 53.014 Volume: (cm) 10.603 Hileman, Timmothy ([id number]) % Reduction in Area: -237.5% % Reduction in Volume: -574.9% [id number] Page 14 of 15 Wound Description Classification: Category/Stage III Exudate Amount: Medium Exudate Type: Serosanguineous Exudate Color: red, brown Treatment Notes Wound #23 (Lower Leg) Wound Laterality: Right, Posterior Cleanser Soap and Water  Discharge Instruction: Gently cleanse wound with antibacterial soap, rinse and pat dry prior to dressing wounds Wound Cleanser Discharge Instruction: Wash your hands with soap and water . Remove old dressing, discard into plastic bag and place into trash. Cleanse the wound with Wound Cleanser prior to applying a clean dressing using gauze sponges, not tissues or cotton balls. Do not scrub or use excessive force. Pat dry using gauze sponges, not tissue or cotton balls. Peri-Wound Care Topical Primary Dressing Secondary Dressing ABD Pad 5x9 (in/in) Discharge Instruction: Cover with ABD pad Heel Cup Secured With Kerlix Roll Sterile or Non-Sterile 6-ply 4.5x4 (yd/yd) Discharge Instruction: Apply Kerlix as directed Tubigrip Size D, 3x10 (in/yd) Compression Wrap Compression Stockings Add-Ons Electronic Signature(s) Signed: 08/10/2023 4:49:55 PM By: [name] MSN RN CNS WTA Entered By: [name] on 08/10/2023 10:05:02 -------------------------------------------------------------------------------- Vitals Details Patient Name: Date of Service: Chad [name]  08/10/2023 9:15 A M Medical Record Number: [id number] Patient Account Number: [id number] Date of Birth/Sex: Treating RN: [birth year] (63 y.o. [name] Primary Care Jovee Dettinger: Gerlean Maier  Other Clinician: Referring Tzion Wedel: Treating Nylia Gavina/Extender: Bethena Andre Gerlean Maier Weeks in Treatment: 53 Vital Signs Time Taken: 09:43 Temperature (F): 97.9 Height (in): 69 Pulse (bpm): 80 Weight (lbs): 150 Respiratory Rate (breaths/min): 18 Body Mass Index (BMI): 22.1 Blood Pressure (mmHg): 99/67 Reference Range: 80 - 120 mg / dl Pardini, Ellen ([id number]) [id number] Page 15 of 15 Electronic Signature(s) Signed: 08/10/2023 4:49:55 PM By: [name] MSN RN CNS WTA Entered By: [name] on 08/10/2023 09:57:06

## 2023-08-12 NOTE — Progress Notes (Addendum)
 Chad [name], Chad [name] ([id number]) [id number] Page 1 of 18 Visit Report for 08/10/2023 Chief Complaint Document Details Patient Name: Date of Service: Chad [name] 08/10/2023 9:15 A M Medical Record Number: [id number] Patient Account Number: [id number] Date of Birth/Sex: Treating RN: Dec 24, 1960 (63 y.o. Chad [name] Primary Care Provider: Gerlean Maier Other Clinician: Referring Provider: Treating Provider/Extender: Bethena Andre Gerlean Maier Weeks in Treatment: 55 Information Obtained from: Patient Chief Complaint Left ankle ulcer, Left dorsal foot wound, right heel Electronic Signature(s) Signed: 08/10/2023 9:25:30 AM By: Bethena Andre PA-C Entered By: Bethena Andre on 08/10/2023 09:25:30 -------------------------------------------------------------------------------- HPI Details Patient Name: Date of Service: Chad [name] 08/10/2023 9:15 A M Medical Record Number: [id number] Patient Account Number: [id number] Date of Birth/Sex: Treating RN: [birth year] (63 y.o. Chad [name] Primary Care Provider: Gerlean Maier Other Clinician: Referring Provider: Treating Provider/Extender: Bethena Andre Gerlean Maier Devra in Treatment: 54 History of Present Illness HPI Description: 10/08/18 on evaluation today patient actually presents to our office for initial evaluation concerning wounds that he has of the bilateral lower extremities. 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 He also has bilateral venous stasis. He apparently has been homeless currently following discharge from the hospital apparently he has been placed at [facility] which is is a skilled nursing facility locally. Nonetheless fortunately he does not show any signs of infection at this time which is good news. In fact several of the wound  actually appears to be showing some signs of improvement already in my pinion. There are a couple areas in the left leg in particular there likely gonna require some sharp debridement to help clear away some necrotic tissue and help with more sufficient healing. No fevers, chills, nausea, or vomiting noted at this time. 10/15/18 on evaluation today patient actually appears to be doing very well in regard to his bilateral lower extremities. He's been tolerating the dressing changes without complication. Fortunately there does not appear to be any evidence of active infection at this time which is great news. Overall I'm actually very pleased with how this has progressed in just one visits time. Readmission: 08/14/2020 upon evaluation today patient presents for re-evaluation here in our clinic. He is having issues with his left ankle region as well as his right toe and his right heel. He tells me that the toe and heel actually began as a area that was itching that he was scratching and then subsequently opened up into wounds. These may have been abscess areas I presume based on what I am seeing currently. With regard to his left ankle region he tells me this was a similar type Chad [name] ([id number]) [id number] Page 2 of 18 occurrence although he does have venous stasis this very well may be more of a venous leg ulcer more than anything. Nonetheless I do believe that the patient would benefit from appropriate and aggressive wound care to try to help get things under better control here. He does have history of a stroke on the left side affecting him to some degree there that he is able to stand although he does have some residual weakness. Otherwise again the patient does have chronic venous insufficiency as previously noted. His arterial studies most recently obtained showed that he had an ABI on the right of 1.16 with a TBI of 0.52 and on the left and ABI of 1.14 with  a  TBI of 0.81. That was obtained on 06/19/2020. 08/28/2020 upon evaluation today patient appears to be doing decently well in regard to his wounds in general. He has been tolerating the dressing changes without complication. Fortunately there does not appear to be any signs of active infection which is great news. With that being said I think the Hydrofera Blue is doing a good job I would recommend that we likely continue with that currently. 09/11/2020 upon evaluation today patient's wounds did not appear to be doing too poorly but again he is not really showing signs of significant improvement with regard to any of the wounds on the right. None of them have Hydrofera Blue on them I am not exactly sure why this is not being followed as the facility did not contact us  to let us  know of any issues with obtaining dressings or otherwise. With that being said he is supposed to be using Hydrofera Blue on both of the wounds on the right foot as well as the ankle wound on the left side. 09/18/2020 upon evaluation today patient appears to be doing poorly with regard to his wounds. Again right now the left ankle in particular showed signs of extreme maceration. Apparently he was told by someone with staff at [facility] they could not get the Hydrofera Blue. With that being said this is something that is never been relayed to us  one way or another. Also the patient subsequently has not supposed to have a border gauze dressing on. He should have an ABD pad and roll gauze to secure as this drains much too much just to have a border gauze dressing to cover. Nonetheless the fact that they are not using the appropriate dressing is directly causing deterioration of the left ankle wound it is significantly worse today compared to what it was previous. I did attempt to call [facility] while the patient was here I called three times and got no one to even pick up the phone. After this I had my for an  office coordinator call and she was able to finally get through and leave a message with the D ON as of dictation of this note which is roughly about an hour and a half later I still have not been able to speak with anyone at the facility. 09/25/2020 upon evaluation today patient actually showing signs of good improvement which is excellent news. He has been tolerating the dressing changes without complication. Fortunately there is no signs of active infection which is great news. No fevers, chills, nausea, vomiting, or diarrhea. I do feel like the facility has been doing a much better job at taking care of him as far as the dressings are concerned. However the director of nursing never did call me back. 10/09/2020 upon evaluation today patient appears to be doing well with regard to his wound. The toe ulcer did require some debridement but the other 2 areas actually appear to be doing quite well. 10/19/2020 upon evaluation today patient actually appears to be doing very well in regard to his wounds. In fact the heel does appear to be completely healed. The toe is doing better in the medial ankle on the left is also doing better. Overall I think he is headed in the right direction. 10/26/2020 upon evaluation today patient appears to be doing well with regard to his wound. He is showing signs of improvement which is great news and overall I am very pleased with where things stand today. No  fevers, chills, nausea, vomiting, or diarrhea. 11/02/2020 upon evaluation today patient appears to be doing well with regard to his wounds. He has been tolerating the dressing changes without complication overall I am extremely pleased with where things stand today. He in regard to the toe is almost completely healed and the medial ankle on the left is doing much better. 11/09/2020 upon evaluation today patient appears to be doing a little poorly in regard to his left medial ankle ulcer. Fortunately there does not appear to  be any signs of systemic infection but unfortunately locally he does appear to be infected in fact he has blue-green drainage consistent with Pseudomonas. 11/16/2020 upon evaluation today patient appears to be doing well with regard to his wound. It actually appears to be doing better. I did place him on gentamicin  cream since the Cipro  was actually resistant even though he was positive for Pseudomonas on culture. Overall I think that he does seem to be doing better though I am unsure whether or not they have actually been putting the cream on. The patient is not sure that we did talk to the nurse directly and she was going to initiate that treatment. Fortunately there does not appear to be any signs of active infection at this time. No fevers, chills, nausea, vomiting, or diarrhea. 4/28; the area on the right second toe is close to healed. Left medial ankle required debridement 12/07/2020 upon evaluation today patient appears to be doing well with regard to his wounds. In fact the right second toe appears to be completely healed which is great news. Fortunately there does not appear to be any signs of active infection at this time which is also great news. I think we can probably discontinue the gentamicin  on top of everything else. 12/14/2020 upon evaluation today patient appears to be doing well with regard to his wound. He is making good progress and overall very pleased with where things stand today. There is no signs of active infection at this time which is great news. 12/28/2020 upon evaluation today patient appears to be doing well with regard to his wounds. He has been tolerating the dressing changes without complication. Fortunately there is no signs of active infection at this time. No fevers, chills, nausea, vomiting, or diarrhea. 12/28/2020 upon evaluation today patient's wound bed actually showed signs of excellent improvement. He has great epithelization and granulation I do not see any  signs of infection overall I am extremely pleased with where things stand at this point. No fevers, chills, nausea, vomiting, or diarrhea. 01/11/2021 upon evaluation today patient appears to be doing well with regard to his wound on his leg. He has been tolerating the dressing changes without complication. Fortunately there does not appear to be any signs of active infection which is great news. No fevers, chills, nausea, vomiting, or diarrhea. 01/25/2021 upon evaluation today patient appears to be doing well with regard to his wound. He has been tolerating the dressing changes without complication. Fortunately the collagen seems to be doing a great job which is excellent news. No fevers, chills, nausea, vomiting, or diarrhea. 02/08/2021 upon evaluation today patient's wound is actually looking a little bit worse especially in the periwound compared to previous. Fortunately there does not appear to be any signs of infection which is great news with that being said he does have some irritation around the periphery of the wound which has me more concerned. He actually had a dressing on that had not been changed in 3  days. He also is supposed to have daily dressing changes. With regard to the dressing applied he had a silver alginate dressing and silver collagen is what is recommended and ordered. He also had no Desitin around the edges of the wound in the periwound region although that is on the order inspect to be done as well. In general I was very concerned I did contact [facility] actually spoke with Sonny who is the scheduling individual and subsequently she stated that she would pass the information to the D ON apparently the D ON was not available to talk to me when I call today. 02/18/2021 upon evaluation today patient's wound is actually showing signs of improvement. Fortunately there does not appear to be any evidence of infection which is great news overall I am extremely pleased with  where things stand today. No fevers, chills, nausea, vomiting, or diarrhea. 8/3; patient presents for 1 week follow-up. He has no issues or complaints today. He denies signs of infection. 03/11/2021 upon evaluation today patient appears to be doing well with regard to his wound. He does have a little bit of slough noted on the surface of the wound but fortunately there does not appear to be any signs of active infection at this time. No fevers, chills, nausea, vomiting, or diarrhea. 03/18/2021 upon evaluation today patient appears to be doing well with regard to his wound. He has been tolerating the dressing changes without complication. There was a little irritation more proximal to where the wound was that was not noted last week but nonetheless this is very superficial just seems to be more irritation we just need to make sure to put a good amount of the zinc over the area in my opinion. Otherwise he does not seem to be doing significantly worse at all which is great news. 03/25/2021 upon evaluation today patient appears to be doing well with regard to his wound. He is going require some sharp debridement today to clear with Chad [name], Chad [name] ([id number]) [id number] Page 3 of 18 some of the necrotic debris. I did perform this today without complication postdebridement wound bed appears to be doing much better this is great news. 04/08/2021 upon evaluation today patient appears to be doing decently well in regard to his wound although the overall measurement is not significantly smaller compared to previous. It is gone down a little bit but still the facility continues to not really put the appropriate dressings in place in fact he was supposed to have collagen we think he probably had more of an allergy to At this point. Fortunately there does not appear to be any signs of active infection systemically though locally I do not see anything on initial visualization either as  far as erythema or warmth. 04/15/2021 upon evaluation today patient appears to be doing well with regard to his wound. He is actually showing signs of improvement. I did place him on antibiotics last week, Cipro . He has been taking that 2 times a day and seems to be tolerating it very well. I do not see any evidence of worsening and in fact the overall appearance of the wound is smaller today which is also great news. 9/26; left medial ankle chronic venous insufficiency wound is improved. Using Hydrofera Blue 10/10; left medial ankle chronic venous insufficiency. Wound has not changed much in appearance completely nonviable surface. Apparently there have been problems getting the right product on the wound at the facility although he came in with Hydrofera Blue on today  05/14/2021 upon evaluation today patient appears to be doing well with regard to his wound. I think he is making progress here which is good news. Fortunately there does not appear to be any signs of active infection at this time. No fevers, chills, nausea, vomiting, or diarrhea. 05/20/2021 upon evaluation today patient appears to be doing well with regard to his wound. He is showing signs of good improvement which is great news. There does not appear to be any evidence of active infection which is also excellent news. No fevers, chills, nausea, vomiting, or diarrhea. 05/28/2021 upon evaluation today patient appears to be doing quite well. There does not appear to be any signs of active infection at this time which is great news. Overall I am extremely pleased with where things stand today. I think he is headed in the right direction. 06/11/2021 upon evaluation today patient appears to be doing well with regard to his left ankle ulcer and poorly in regard to the toe ulcer on the second toe right foot. This appears to show signs of joint exposure. Apparently this has been present for 1 to 2 months although he kept forgetting to tell me  about it. That is unfortunate as right now it definitely appears to be doing significantly worse than what I would like to see. There does not appear to be any signs of active infection systemically though locally I am concerned about the possibility of infection the toe is quite red. Again no one from the facility ever contacted us  to advise that this was going on in the interim either. 06/17/2021 upon evaluation today patient presents for follow-up I did review his x-ray which showed a navicular bone fracture I am unsure of the chronicity of this. Subsequently he also had osteomyelitis of the toe which was what I was more concerned about this did not show up on x-ray but did show up on the pathology scrapings. This was listed as acute osteomyelitis. Nonetheless at this point I think that the antibiotic treatment is the best regimen to go with currently. The patient is in agreement with that plan. Nonetheless he has initially 30 days of doxycycline off likely extend that towards the end of the treatment cycle that will be around the middle of December for an additional 2 weeks. That all depends on how well he continues to heal. Nonetheless based on what I am seeing in the foot I did want a proceed with an MRI as well which I think will be helpful to identify if there is anything else that needs to be addressed from the standpoint of infection. 06/24/2021 upon evaluation today patient appears to be doing pretty well in regards to his wounds. I think both are actually showing signs of improvement which is good I did review his MRI today which did show signs of osteomyelitis of the middle and proximal phalanx on his right foot of the affected toe. With that being said this is actually showing signs of significant improvement today already with the antibiotic therapy I think the redness is also improved. Overall I think that we just need to give this some time with appropriate wound care we will see how  things go potentially hyperbarics could be considered. 07/02/2021 upon inspection today patient actually appears to be doing well in regard to his left ankle which is getting very close to complete resolution of pleased in that regard. Unfortunately he is continuing to have issues with his second toe right foot and this seems  to still be very painful for him. Recommend he try something different from the standpoint of antibiotics. 07/15/2021 upon evaluation today patient appears to be doing actually pretty well in regard to his foot. This is actually showing signs of significant improvement which is great news. Overall I feel like the patient is improving both in regard to the second toe as well as the ankle on the left. With that being said the biggest issue that I do see currently is that he is needing to have a refill of the doxycycline that we previously treated him with. He also did see podiatry they are not going to recommend any amputation at this point since he seems to be doing quite well. For that reason we just need to keep things under control from an infection standpoint. 08/01/2021 upon evaluation today patient appears to be doing well with regard to his wound. He has been tolerating the dressing changes without complication. Fortunately there does not appear to be any evidence of active infection locally nor systemically at this point. In fact I think everything is doing excellent in fact his second toe on the right foot is almost healed and the ankle on the left ankle region is actually very close to being healed as well. 08/08/2021 upon evaluation today patient appears to be doing well with regard to his wound. He has been tolerating the dressing changes without complication. Fortunately I do not see any signs of active infection at this time. Readmission: 12-06-2021 upon evaluation today patient presents for reevaluation here in the clinic he does tell me that he was being seen in  facility at [facility] by a provider that was coming in. He is not sure who this was. He tells me however that the wound seems to have gotten worse even compared to where it was when we last saw him at this point. With that being said I do believe that he is likely going need ongoing wound care here in the clinic and I do believe that we need to be the ones to frontline this since his wound does seem to be getting worse not better at this point. He voiced understanding. He is also in agreement with this plan and feels more comfortable coming here she tells me. Patient's medical history really has not changed since his prior admission he was only gone since January. 12-27-2021 upon evaluation today patient appears to be doing well with regard to his wound they did run out of the Hydrofera Blue so they did not put anything on just an ABD pad with gentamicin . Still we are seeing some signs of good improvement here with some new epithelization which is great news. 01-10-2022 upon evaluation today patient appears to be doing well with regard to his wounds and he is going require some sharp debridement but overall seems to be making good progress. Fortunately I do not see any evidence of active infection locally or systemically at this time which is great news. 01-24-2022 upon evaluation today patient appears to be doing well with regard to his wound. The facility actually came and dropped him off early and he had another appointment at the hospital and then they just brought him over here and this was still hours before his appointment this afternoon. For that reason we did do our best to work him in this morning and fortunately had some space to make this happen. With that being said patient's wound does seem to be making progress here and I  am very pleased in that regard I do not see any signs of active infection locally or systemically at this time. 02-07-2022 patient appears to be doing well  currently in regard to his wounds. In fact one of them the more proximal is healed the distal is still open but seems to be doing excellent. Fortunately I do not see any evidence of active infection locally or systemically at this time which is great news. No fevers, chills, nausea, vomiting, or diarrhea. 7/27; left medial ankle venous. Improving per our intake nurse. We are using Hydrofera Blue under Tubigrip compression. They are changing that at his facility 03-06-2022 upon evaluation today patient appears to be doing well with regard to the his wound he is can require some sharp debridement but seems to be making excellent progress. Fortunately I do not see any evidence of active infection locally or systemically which is great news. 03-27-2022 upon evaluation today patient's wound is actually showing signs of excellent improvement. Fortunately I see no signs of active infection locally or systemically at this time which is great news. No fevers, chills, nausea, vomiting, or diarrhea. Chad [name], Chad [name] ([id number]) [id number] Page 4 of 18 04-21-2022 upon evaluation today patient appears to be doing excellent in regard to his wound in fact this is very close to resolution based on what I am seeing. I do not see any evidence of active infection locally or systemically at this time which is great news and overall I am extremely pleased with where we are today. 05-05-2022 upon evaluation today patient's wound actually appears to potentially be completely healed. Fortunately I do not see any evidence of active infection at this time which is great news and overall I am very pleased I think this needs a little time to toughen up but other than that I really do believe were doing quite well. He is very pleased to hear this its been a long time coming. 05-19-2022 upon evaluation today patient appears to be doing well currently in regard to his wound. He in fact we were hoping will  be completely healed and closed but it still has a very tiny area right in the center which is still continuing to drain. Fortunately I do not see any signs of infection locally or systemically which is great news. 11/6; this wound is close to completely he comes in this week with some erythema at 1 edge of this which looks like something was rubbing on the area. Other than that no evidence of infection 06-16-2022 upon evaluation today patient appears to be doing well currently in regard to his ankle ulcer. This is very close to being healed but still has a small area in the central portion which is not. Fortunately there does not appear to be any signs of infection locally or systemically which is great news. No fevers, chills, nausea, vomiting, or diarrhea. 06-30-2022 upon evaluation patient's wound pretty much appears to be almost completely closed. In fact it may even be closed but there are still a lot of skin irritation around the edges of the wound and I just do not feel great about releasing them yet I would like to continue to monitor this just a little bit longer before getting to that point. He is not opposed to this and in fact is happy to continue to come in if need be in order to make sure that things are moving in the right direction he definitely does not want to backtrack. 07-14-2022 upon evaluation today patient  appears to be doing well currently in regard to his wound. Has been tolerating the dressing changes without complication. Fortunately I see no evidence of active infection locally nor systemically which is great news. No fevers, chills, nausea, vomiting, or diarrhea. 08-11-2022 upon evaluation today patient appears to be doing a little worse than last time I saw him although it has been a month. Unfortunately he was not able to be seen due to the fact that he was quarantined in the facility with COVID. Subsequently he tells me that he mention to the facility staff several  times about taking it off over the past several weeks but they continue to tell him that someone have to get in touch with us  here although nobody ever did until Friday when the nurse manager called to have that documented in the communication notes. With that being said unfortunately this means that he had a wrap on that he was supposed to have on for only 1 week for ended up being on four 1 month essentially. I am glad that there is nothing worse than what we see currently to be perfectly honest. 08-19-2022 upon evaluation today patient appears to be doing well currently in regard to his wounds. He has been tolerating the dressing changes without complication. This is actually smaller today which is great news after last week's reopening. Fortunately I do not see any evidence of infection locally nor systemically at this point. 08-26-2022 upon evaluation today patient appears to be doing well currently in regard to his wound. He has been tolerating the dressing changes without complication. Fortunately there does not appear to be any signs of active infection locally nor systemically which is great news. No fevers, chills, nausea, vomiting, or diarrhea. 09-02-2022 upon evaluation today patient's wound actually showing signs of excellent improvement this is measuring smaller and looking much better. I am very pleased with where we stand I do believe that we are headed in the right direction. 09-09-2022 upon evaluation today patient appears to be doing decently well in regard to his leg in general although there was some swelling this is the 1 thing that I am not too happy with based on what I see currently. Fortunately there does not appear to be any signs of infection locally nor systemically at this point. 09-16-2022 upon evaluation today patient appears to be doing well currently in regard to his wound. He is actually showing signs of improvement he wore the Tubigrip this week and it significantly  better compared to last week's evaluation. Fortunately I do not see any evidence of active infection locally or systemically which is great news. Overall I think that were headed in the right direction which is great news. In fact overall I think this is the best that he has looked in some time. He does not like the Tubigrip but actually did extremely well with this in my opinion. I think he needs to continue to utilize this. 09-23-2022 upon evaluation today patient appears to be doing well currently in regard to his wound. He has been tolerating the dressing changes without complication. Fortunately there does not appear to be any signs of active infection locally nor systemically which is great news and in general I do feel like that we are headed in the right direction. He has been having trouble with the Tubigrip however we have gotten the facility to order compression socks he has not gotten them as of yet. 3/4; patient is about the same with a wound  on the left posterior leg. This is a venous wound. He needs more compression on this leg but he took off the previous wraps we are using. I am not certain whether he is using juxta lite stockings at the nursing home. 3/12; patient presents for follow-up. He unfortunately has 2 new wounds 1 to the dorsal left side and the other to the right heel. He is not sure how these happened. The right heel appears to be caused by pressure. He has been using Hydrofera Blue to the original wound on the left leg. 10-14-2022 upon evaluation today patient appears to be doing well currently in regard to his original wound but unfortunately he has several new wounds which I was completely unaware of before walking into the room to see him today. Unfortunately he seems to not be doing nearly as well as what I saw when I last had an appointment with him 3 weeks ago. Fortunately I do not see any signs of systemic infection though unfortunately locally he has definite  infection of the left lower extremity. He is currently on doxycycline I am going to likely have to extend that today. 10-21-2022 upon evaluation today patient appears to be doing well currently in regard to his wounds. He seems to be tolerating the dressing changes without complication. Fortunately there does not appear to be any signs of active infection locally or systemically which is great news and in general I definitely feel like we are on the right track here. 10-30-2022 upon evaluation today patient still continues to not be doing too well at all with regard to his wounds. Unfortunately he is still having signs of infection as well which also has been concerned. Fortunately I do not see any signs of active infection locally nor systemically at this time. No fevers, chills, nausea, vomiting, or diarrhea. 11-06-2022 upon evaluation today patient appears to be doing okay currently in regard to his wounds. All things considered with his blood flow not being nearly as good as what should I think that he is making pretty good progress here. Fortunately I do not see any signs of infection locally nor systemically which is great news. No fevers, chills, nausea, vomiting, or diarrhea. 11-13-2022 upon evaluation today patient appears to be doing poorly currently in regard to his wounds. Things seem to be getting a little bit worse not better. 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 Subsequently he is also going to need to have vascular intervention but we are unsure exactly when this is going on the undertaking. I would try to actually send a message to Dr. Rodolph and the vascular team just to see what exactly the plan is going forward. The patient is in agreement with that plan I just think we need to have a plan especially in light of the fact the left leg seems to be getting a little bit worse at this  point. 11-25-2022 upon evaluation today patient is currently now discharged from the hospital following his colostomy procedure. That did very well and he seems to [facility], [facility] ([id number]) [id number] Page 5 of 18 be doing well. Fortunately there does not appear to be any signs of active infection locally or systemically which is great news. No fevers, chills, nausea, vomiting, or diarrhea. 12-04-2022 upon evaluation today patient appears to be doing actually decently well in regard to his wounds and actually very pleased  with where we stand and I think that he is making some pretty good progress here. Fortunately I do not see any signs of active infection locally nor systemically which is great news. No fevers, chills, nausea, vomiting, or diarrhea. 12-12-2022 upon evaluation today patient appears to be doing well currently in regard to his wound. He has been tolerating the dressing changes without complication. The wounds all seem to be making some progress to a degree. He has appointment with vascular on the 23rd of this month. 5/24; the patient was scheduled for a left lower extremity angiogram yesterday but apparently they were overbooked and they wanted to admit him to hospital last night so they could do this early this morning but he refused I see that Dr. Marea has rescheduled him for June 6. He is using silver alginate to wounds on his bilateral lower extremities. 01-02-2023 upon evaluation today patient appears to be doing poorly in regard to his wounds still. Subsequently he did have his angiogram but apparently there really was not much that was found that could be revascularized. The patient tells me that Dr. Marea was going to contact me here in the office I have not heard from him as of yet and I may see about giving him a call just to follow-up or possibly send a message through epic which could be an option as well. 01-09-2023 upon evaluation today patient  appears to be doing poorly in regard to his legs he is extremely swollen. He has been sleeping in his chair he tells me for the past week. His bed is broken and they have ordered a new one which is supposedly supposed to be there today. With that being said he the way he needs to be not sleeping in his chair that is his wheelchair every day and through the night. This is causing his legs to be much more swollen and it seen and how badly wounds are doing at this point. This has made things significantly worse. Fortunately I do not see any signs of infection right now unfortunately I do believe that the patient has a tremendous amount of drainage currently. 01-19-2023 upon evaluation today patient appears to be doing poorly in regard to his wounds of his legs his swelling is better but still not as good as what we would like to see. Fortunately I do not see any evidence of active infection locally nor systemically which is great news and in general I do believe that we are moving in the right direction here. Nonetheless it would be better if she was in the bed sleeping or not send up in his wheelchair although he seems to still be set up in the wheelchair at this time. He tells me that he is just not as comfortable in the bed as the wheelchair but unfortunately this is causing damage to his legs which I discussed with him today as well. Nonetheless I am not sure that he is going to be amendable to doing anything different. 01-27-2023 upon evaluation today patient appears to be doing well currently. Fortunately I do not see any evidence of active infection locally nor systemically which is great news I think he is actually doing the best option and while in fact I would try to see if we can do some debridement today to get things moving along. 02-03-2023 upon evaluation today patient's wounds unfortunately are not doing nearly as well. Will be doing the Foot locker wraps unfortunately we just not seeing the  improvement that I would like to see. Fortunately I do not see any evidence of infection at this time which is good news unfortunately I do think that he needs to be changed on a daily basis due to the amount of drainage otherwise I do not think this is really going to continue to improve dramatically. 02-12-2023 upon evaluation today patient appears to be doing better in a lot of ways in regard to his wounds and although his toes on the left side actually seem a bit worse. Fortunately I do not see any evidence of active infection locally nor systemically at this time which is good news. With that being said he is having some bleeding from the left leg I think that this is an area that looks like it probably got bumped. With that being said I discussed this with the patient he tells me he does not know of any trauma that occurred but at the same time if was not this that it was skin that got pulled off. Dressing change there is only one of the 2 that could have happened. 7/25; this is a patient who is very frail at [facility] nursing facility. He is nonambulatory. He has wounds on his bilateral lower legs. However much worse on the left we have been using silver cell to all of his wounds He comes in today and a lot more pain our intake nurse reports deterioration of the left foot. I ended up looking through some of his record. 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 I am really not certain of the stage of  this currently or who is following 02-26-2023 upon evaluation today patient unfortunately appears to be doing significantly worse compared to where he was previous. The left foot is showing signs of dramatic worsening in regard to the overall appearance his toes are starting to become necrotic almost in full and he has much more cyanosis present than what I even seen previous. He is already seeing Dr. Marea and unfortunately there was not anything that they saw that was an immediate cause for this but at the same time I feel like this may have changed and even worsened I feel like this could be a vascular event and I believe this is a limb-threatening situation. 03/26/2023; I last saw this patient on 8/1. Wondered about below-knee amputations versus palliative care. 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 The wound has healed fairly well and he follows up with surgery today. He is back today looking at wounds on the right foot and lower leg. This includes 2 areas on the right heel, right dorsal foot right dorsal first and second toes and a small area on the right anterior lower tibia area. He has a lot of pain in this leg as well. The angiogram he had on 01/01/2023 specific to the right leg now demonstrated a fairly normal common femoral artery, profunda femoris artery and superficial and popliteal arteries without significant disease. 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 No doubt this has a lot to do with the discomfort in the right leg he describes when he is in bed. They have been using Xeroform on the wounds on the right. He has a lot of swelling in this leg with weeping edema 04-09-2023 upon evaluation today patient appears to be doing poorly still in regard to his right leg. 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  Fortunately I do not see any signs of active infection locally or systemically at this time which is great news. No fevers, chills, nausea, vomiting, or diarrhea. With that being said I am concerned still about the fact that he seems to be getting worse especially in regard to the heels I think that he is still sitting up most of the day which I think is still causing some swelling of his leg unfortunately. 04-16-2023 upon evaluation today patient actually appears to be doing little bit more poorly compared to last week this actually does appear to be infected. Last week I really did not think that was the case but this seems like it may indeed be the case. Fortunately I do not see any signs of active infection locally or systemically at this time. 04-24-2023 upon evaluation today patient appears to be doing well currently in regard to his leg compared to where we were previous I do feel like that the antibiotics have been of benefit. Fortunately I do not see any signs of active infection at this time which is great news and in general I believe that we are making headway towards healing which is great news as well. With that being said he still has a lot of drainage I really feel like something like Jace would be better for him than just an ABD pad it would likely the drainage more or even Zetuvit's but I am not sure whether we can get either 1 of these at the facility for him. 05-01-2023 upon evaluation today patient appears to be doing well currently in regard to his wounds which are showing signs of significant improvement is not any way shape or form infected like it was last time I saw him. I think the antibiotics are doing a good job but I think he may need an extension here to ensure Chad [name], Chad [name] ([id number]) [id number] Page 6 of 18 that it completely clears. 05-08-2023 upon evaluation today patient appears to be doing well currently in regard to his wounds for  the most part. He still has blue-green drainage noted but in general seems to be doing much better compared to where we were previous. Fortunately I do not see any signs of active infection locally or systemically which is great news and in general I do believe that we are making good headway towards complete closure. 05-18-2023 upon evaluation today patient appears to be doing well currently in regard to his wounds I feel like he is definitely making some improvements here. This is a slow improvement but nonetheless improvement. I do believe that he is doing quite well as far as infection is concerned which is also good news. 05-25-2023 upon evaluation today patient actually appears to be doing worse in regard to his legs at this point. Unfortunately he continues to have issues with significant drainage which is the primary concern here. I do feel like that the wound currently is not doing a whole lot better  when he was on the Levaquin  I felt like it was improving some but the concern there is QT prolongation I do not want to continue that personally further I think he needs a referral ASAP to infectious disease. I discussed that with him today. He also needs to be having this changed every day and that has not been happening at this point. That is my biggest concern here. 06-09-2023 upon evaluation today patient appears to be doing still poorly in regard to this right leg which is draining profusely. This is unfortunately just continuing to show signs of being very moist and wet and weeping quite significantly. This is despite everything that we are doing including the facility have been placed him on Levaquin  by ID which unfortunately just does not seem to be helping to heal this as well as we would hope either. I do have an infectious disease appointment that we sent 10 for him and that is actually scheduled for next week on the 19th. He has an appointment with vascular on the 14th of this week  although I think they are looking at his left leg primarily at the amputation site. 06-17-2023 upon evaluation today patient appears to be doing still poorly in regard to his legs at this point. Unfortunately he still seems to be having issues with significant drainage we are having this changed every single day. With that being said despite this the patient just does not seem to be making a lot of headway here towards closure. We are trying to do what we can from the standpoint of compression as well. He did see infectious disease yesterday. I did review that note as well. Upon evaluation the note actually states that the patient told the physician do I have to keep suffering with wounds forevero Is there a way to get rid of these woundso However he did not want a referral to see Dr. Harden for potential amputation which seems to be 1 option that was proposed. With that being said he also apparently told her that he was adamant about not doing another course of antibiotics as he feels like they have not healed his wounds. It appears based on evaluation with regard to the record that the patient did have a discussion with her about the possibility of doing a short course of IV cefepime  for 14 days to cover Serratia as well as the other bacteria noted on culture. However the patient declined this as he felt like the antibiotics in the past he had taken did not help at all and there were no signs and symptoms to suggest sepsis. Therefore she recommended continuing wound care and to continue to follow-up with vascular follow-up in 2 weeks with her. I appreciate this evaluation and again I cannot do anything with the fact that the patient did not want to proceed with any further treatment with regard to the bacterial infection. 06-23-2023 the patient seems to making some progress here in regard to his wound. I feel like it is still a tremendous amount of drainage but at the same time I feel like you are  also showing some signs of significant improvement which is great news and very pleased with where things stand currently. Especially compared to where we have been. 07-07-2023 upon evaluation today patient appears to be doing about the same currently in regard to his wounds. He does not seem quite as erythematous as he has been around the wounds but the wounds continue to get larger due  to the swelling and the drainage. Again I think the moisture is causing additional and further breakdown which is unfortunate. With that being said I discussed with the patient that I do believe that the only way to get this better is going to be to keep the swelling under control and the drainage off of his skin. This is the hardest part to be sure. I think the alginate dressing is being used at the facility is actually keeping things more moist which is not ideal. 07-15-2023 upon evaluation today patient appears to be doing poorly still in regard to his wounds he still continues to have significant issues. Fortunately I do not see any signs of active infection which is great news and in general I am pleased in that regard but at the same time I do believe that he is not doing quite as well as he could be especially in light of the fact that he still did put the alginate on which is gets very wet and stays completely soaked on the wounds on trying to get this off and just use the XtraSorb which would think be much better. 12/31; this is a patient with combined severe PAD and venous insufficiency. He has very substantial wound areas involving the left lower leg especially medially left dorsal foot. Weeping edema fluid. We have been using silver alginate Xtrasorb 08-10-2023 upon evaluation today patient appears to be doing poorly currently in regard to his wound. His wounds unfortunately despite what we are doing just seem to be getting worse and worse. I do not believe that he is moving in any way shape or form into the  appropriate direction at this point and I do believe that he is going to require some intervention honestly at this point. We actually had a conversation that I was really hoping not to have today which was considering amputation of the right leg as he did with the left leg. This was a below-knee amputation. With that being said I am unsure that we will go be able to get this healed short of that. We have been really working hard trying to get things moving in the right direction including daily dressing changes and week by week the wound seem to keep getting worse. In fact he is pretty much at the point where next week working to reclassify these wounds as just to do different wounds 1 on the foot and 1 on the leg as they pretty much merged into 1 wound at this point. Electronic Signature(s) Signed: 08/10/2023 2:47:23 PM By: Bethena Ferraris PA-C Entered By: Bethena Ferraris on 08/10/2023 14:47:23 -------------------------------------------------------------------------------- Physical Exam Details Patient Name: Date of Service: Chad [name], Chad [name] 08/10/2023 9:15 A M Medical Record Number: [id number] Patient Account Number: [id number] Date of Birth/Sex: Treating RN: 08-08-1960 (63 y.o. Chad [name] Primary Care Provider: Gerlean Maier Other Clinician: Referring Provider: Treating Provider/Extender: Bethena Ferraris Gerlean Maier Weeks in Treatment: [address], [place] ([id number]) [id number] Page 7 of 18 Constitutional Well-nourished and well-hydrated in no acute distress. Respiratory normal breathing without difficulty. Psychiatric this patient is able to make decisions and demonstrates good insight into disease process. Alert and Oriented x 3. pleasant and cooperative. Notes Upon inspection patient's wounds unfortunately are not doing so well I did not perform any debridement he has some muscle and tendon noted on the anterior portion of the leg I feel like that this is  really a vascular issue although has had interventions and is also had evaluation with vascular and  there really was not much more they felt they could do at that point. With that being said I feel like he is probably can to be a candidate and gena require amputation on the right side as well. Electronic Signature(s) Signed: 08/10/2023 2:47:55 PM By: Bethena Ferraris PA-C Entered By: Bethena Ferraris on 08/10/2023 14:47:55 -------------------------------------------------------------------------------- Physician Orders Details Patient Name: Date of Service: Chad [name], Chad [name] 08/10/2023 9:15 A M Medical Record Number: [id number] Patient Account Number: [id number] Date of Birth/Sex: Treating RN: 11/21/1960 (63 y.o. Chad [name] Primary Care Provider: Gerlean Maier Other Clinician: Referring Provider: Treating Provider/Extender: Bethena Ferraris Gerlean Maier Devra in Treatment: 73 The following information was scribed by: Claudene [name] The information was scribed for: Bethena Ferraris Verbal / Phone Orders: No Diagnosis Coding ICD-10 Coding Code Description [phone] Chronic venous hypertension (idiopathic) with ulcer and inflammation of left lower extremity 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 Nurse Visit as needed Bathing/ [employer] wounds with antibacterial soap and water . - LEAVE OFF THE FOAM DRESSING. COVER WOUNDS WITH ABD PADS (DOUBLED IF NEEDED) AND WRAP WITH KERLIX. PUT TUBIGRIP OVER KERLIX. CHANGE DAILY No tub bath. Anesthetic (Use 'Patient Medications' Section for Anesthetic Order Entry) Lidocaine  applied to wound bed Off-Loading Heel suspension boot - when in  bed Turn and reposition every 2 hours Wound Treatment Wound #16 - Calcaneus Wound Laterality: Right Cleanser: Soap and Water  1 x Per Day/30 Days DAKWON, WENBERG ([id number]) [phone].pdf Page 8 of 18 Discharge Instructions: Gently cleanse wound with antibacterial soap, rinse and pat dry prior to dressing wounds Cleanser: Wound Cleanser 1 x Per Day/30 Days Discharge Instructions: Wash your hands with soap and water . Remove old dressing, discard into plastic bag and place into trash. Cleanse the wound with Wound Cleanser prior to applying a clean dressing using gauze sponges, not tissues or cotton balls. Do not scrub or use excessive force. Pat dry using gauze sponges, not tissue or cotton balls. Secondary Dressing: ABD Pad 5x9 (in/in) 1 x Per Day/30 Days Discharge Instructions: Cover with ABD pad Secondary Dressing: Heel Cup 1 x Per Day/30 Days Secured With: Kerlix Roll Sterile or Non-Sterile 6-ply 4.5x4 (yd/yd) 1 x Per Day/30 Days Discharge Instructions: Apply Kerlix as directed Secured With: Tubigrip Size D, 3x10 (in/yd) 1 x Per Day/30 Days Wound #18 - T Second oe Wound Laterality: Right, Medial Cleanser: Soap and Water  1 x Per Day/30 Days Discharge Instructions: Gently cleanse wound with antibacterial soap, rinse and pat dry prior to dressing wounds Cleanser: Wound Cleanser 1 x Per Day/30 Days Discharge Instructions: Wash your hands with soap and water . Remove old dressing, discard into plastic bag and place into trash. Cleanse the wound with Wound Cleanser prior to applying a clean dressing using gauze sponges, not tissues or cotton balls. Do not scrub or use excessive force. Pat dry using gauze sponges, not tissue or cotton balls. Secondary Dressing: ABD Pad 5x9 (in/in) 1 x Per Day/30 Days Discharge Instructions: Cover with ABD pad Secondary Dressing: Heel Cup 1 x Per Day/30 Days Secured With: Kerlix Roll Sterile or Non-Sterile 6-ply 4.5x4 (yd/yd) 1 x Per  Day/30 Days Discharge Instructions: Apply Kerlix as directed Secured With: Tubigrip Size D, 3x10 (in/yd) 1 x Per  Day/30 Days Wound #21 - Foot Wound Laterality: Dorsal, Right Cleanser: Soap and Water  1 x Per Day/30 Days Discharge Instructions: Gently cleanse wound with antibacterial soap, rinse and pat dry prior to dressing wounds Cleanser: Wound Cleanser 1 x Per Day/30 Days Discharge Instructions: Wash your hands with soap and water . Remove old dressing, discard into plastic bag and place into trash. Cleanse the wound with Wound Cleanser prior to applying a clean dressing using gauze sponges, not tissues or cotton balls. Do not scrub or use excessive force. Pat dry using gauze sponges, not tissue or cotton balls. Secondary Dressing: ABD Pad 5x9 (in/in) 1 x Per Day/30 Days Discharge Instructions: Cover with ABD pad Secondary Dressing: Heel Cup 1 x Per Day/30 Days Secured With: Kerlix Roll Sterile or Non-Sterile 6-ply 4.5x4 (yd/yd) 1 x Per Day/30 Days Discharge Instructions: Apply Kerlix as directed Secured With: Tubigrip Size D, 3x10 (in/yd) 1 x Per Day/30 Days Wound #22 - Lower Leg Wound Laterality: Right, Anterior Cleanser: Soap and Water  1 x Per Day/30 Days Discharge Instructions: Gently cleanse wound with antibacterial soap, rinse and pat dry prior to dressing wounds Cleanser: Wound Cleanser 1 x Per Day/30 Days Discharge Instructions: Wash your hands with soap and water . Remove old dressing, discard into plastic bag and place into trash. Cleanse the wound with Wound Cleanser prior to applying a clean dressing using gauze sponges, not tissues or cotton balls. Do not scrub or use excessive force. Pat dry using gauze sponges, not tissue or cotton balls. Secondary Dressing: ABD Pad 5x9 (in/in) 1 x Per Day/30 Days Discharge Instructions: Cover with ABD pad Secondary Dressing: Heel Cup 1 x Per Day/30 Days Secured With: Kerlix Roll Sterile or Non-Sterile 6-ply 4.5x4 (yd/yd) 1 x Per Day/30  Days Discharge Instructions: Apply Kerlix as directed Secured With: Tubigrip Size D, 3x10 (in/yd) 1 x Per Day/30 Days Wound #23 - Lower Leg Wound Laterality: Right, Posterior Cleanser: Soap and Water  1 x Per Day/30 Days Discharge Instructions: Gently cleanse wound with antibacterial soap, rinse and pat dry prior to dressing wounds Chad CHARLESTON ([id number]) [id number] Page 9 of 18 Cleanser: Wound Cleanser 1 x Per Day/30 Days Discharge Instructions: Wash your hands with soap and water . Remove old dressing, discard into plastic bag and place into trash. Cleanse the wound with Wound Cleanser prior to applying a clean dressing using gauze sponges, not tissues or cotton balls. Do not scrub or use excessive force. Pat dry using gauze sponges, not tissue or cotton balls. Secondary Dressing: ABD Pad 5x9 (in/in) 1 x Per Day/30 Days Discharge Instructions: Cover with ABD pad Secondary Dressing: Heel Cup 1 x Per Day/30 Days Secured With: Kerlix Roll Sterile or Non-Sterile 6-ply 4.5x4 (yd/yd) 1 x Per Day/30 Days Discharge Instructions: Apply Kerlix as directed Secured With: Tubigrip Size D, 3x10 (in/yd) 1 x Per Day/30 Days Electronic Signature(s) Signed: 08/10/2023 4:43:18 PM By: Bethena Ferraris PA-C Signed: 08/10/2023 4:49:55 PM By: [name] MSN RN CNS WTA Entered By: Claudene [name] on 08/10/2023 10:41:48 -------------------------------------------------------------------------------- Problem List Details Patient Name: Date of Service: Chad HUTCH [name] 08/10/2023 9:15 A M Medical Record Number: [id number] Patient Account Number: [id number] Date of Birth/Sex: Treating RN: 1961-05-01 (63 y.o. 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 02/19/2023 No Yes foot 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, stage 3 10/07/2022 No Yes Inactive Problems Chad [name], Chad [name] ([id number]) [phone].pdf Page 10 of 18 ICD-10 Code Description Active Date Inactive Date L97.322 Non-pressure chronic ulcer of left ankle with fat layer exposed 12/06/2021 12/06/2021 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 Chad [name] Primary Care Provider: Gerlean Maier Other Clinician: Referring Provider: Treating Provider/Extender: Bethena Ferraris Gerlean Maier Weeks in Treatment: 88 Subjective Chief Complaint Information obtained from Patient Left ankle ulcer, Left dorsal foot wound, right heel History of Present Illness (HPI) 10/08/18 on evaluation today patient actually presents to our office for initial evaluation concerning wounds that he has of the bilateral lower extremities. 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 He also has bilateral venous stasis. He apparently has been homeless currently following discharge from the hospital apparently he has been placed at [facility] which is is a skilled nursing facility locally. Nonetheless fortunately he does not show any signs of infection at this time which is good news. In fact several of the wound actually appears to be showing some signs of improvement already in my pinion. There are a couple areas in the left leg in particular there likely gonna require some sharp debridement to help clear away some necrotic tissue and help with more sufficient healing. No fevers, chills, nausea, or vomiting noted at this time. 10/15/18 on evaluation today patient actually appears to be doing very well in regard to his bilateral lower extremities. He's been tolerating the dressing changes without complication. Fortunately there does not appear to be any evidence of active infection at this time which is great news. Overall I'm actually very pleased with how this has progressed in just one visits time. Readmission: 08/14/2020 upon evaluation today patient presents for re-evaluation here in our clinic. He is having issues with his left ankle region as well as his right toe and his right heel. He tells me that the toe and heel actually began as a area that was itching that he was scratching and then subsequently opened up into wounds. These may have been abscess areas I presume based on what I am seeing currently. With regard to his left ankle region he tells me this was a similar type occurrence although he does have venous stasis this very well may be more of a venous leg ulcer more than anything. Nonetheless I do believe that the patient would benefit from appropriate and aggressive wound care to try to help get things under better control here. He does have history of a stroke on the left side affecting him to some degree there that he is able to stand  although he does have some residual weakness. Otherwise again the patient does have chronic venous insufficiency as previously noted. His arterial  studies most recently obtained showed that he had an ABI on the right of 1.16 with a TBI of 0.52 and on the left and ABI of 1.14 with a TBI of 0.81. That was obtained on 06/19/2020. 08/28/2020 upon evaluation today patient appears to be doing decently well in regard to his wounds in general. He has been tolerating the dressing changes without complication. Fortunately there does not appear to be any signs of active infection which is great news. With that being said I think the Hydrofera Blue is doing a good job I would recommend that we likely continue with that currently. 09/11/2020 upon evaluation today patient's wounds did not appear to be doing too poorly but again he is not really showing signs of significant improvement with regard to any of the wounds on the right. None of them have Hydrofera Blue on them I am not exactly sure why this is not being followed as the facility did not contact us  to let us  know of any issues with obtaining dressings or otherwise. With that being said he is supposed to be using Hydrofera Blue on both of the wounds on the right foot as well as the ankle wound on the left side. 09/18/2020 upon evaluation today patient appears to be doing poorly with regard to his wounds. Again right now the left ankle in particular showed signs of extreme maceration. Apparently he was told by someone with staff at [facility] they could not get the Hydrofera Blue. With that being said this is Chad [name], Chad [name] ([id number]) [id number] Page 11 of 18 something that is never been relayed to us  one way or another. Also the patient subsequently has not supposed to have a border gauze dressing on. He should have an ABD pad and roll gauze to secure as this drains much too much just to have a border gauze dressing to  cover. Nonetheless the fact that they are not using the appropriate dressing is directly causing deterioration of the left ankle wound it is significantly worse today compared to what it was previous. I did attempt to call [facility] while the patient was here I called three times and got no one to even pick up the phone. After this I had my for an office coordinator call and she was able to finally get through and leave a message with the D ON as of dictation of this note which is roughly about an hour and a half later I still have not been able to speak with anyone at the facility. 09/25/2020 upon evaluation today patient actually showing signs of good improvement which is excellent news. He has been tolerating the dressing changes without complication. Fortunately there is no signs of active infection which is great news. No fevers, chills, nausea, vomiting, or diarrhea. I do feel like the facility has been doing a much better job at taking care of him as far as the dressings are concerned. However the director of nursing never did call me back. 10/09/2020 upon evaluation today patient appears to be doing well with regard to his wound. The toe ulcer did require some debridement but the other 2 areas actually appear to be doing quite well. 10/19/2020 upon evaluation today patient actually appears to be doing very well in regard to his wounds. In fact the heel does appear to be completely healed. The toe is doing better in the medial ankle on the left is also doing better. Overall I think he is headed in the right direction. 10/26/2020  upon evaluation today patient appears to be doing well with regard to his wound. He is showing signs of improvement which is great news and overall I am very pleased with where things stand today. No fevers, chills, nausea, vomiting, or diarrhea. 11/02/2020 upon evaluation today patient appears to be doing well with regard to his wounds. He has been tolerating the  dressing changes without complication overall I am extremely pleased with where things stand today. He in regard to the toe is almost completely healed and the medial ankle on the left is doing much better. 11/09/2020 upon evaluation today patient appears to be doing a little poorly in regard to his left medial ankle ulcer. Fortunately there does not appear to be any signs of systemic infection but unfortunately locally he does appear to be infected in fact he has blue-green drainage consistent with Pseudomonas. 11/16/2020 upon evaluation today patient appears to be doing well with regard to his wound. It actually appears to be doing better. I did place him on gentamicin  cream since the Cipro  was actually resistant even though he was positive for Pseudomonas on culture. Overall I think that he does seem to be doing better though I am unsure whether or not they have actually been putting the cream on. The patient is not sure that we did talk to the nurse directly and she was going to initiate that treatment. Fortunately there does not appear to be any signs of active infection at this time. No fevers, chills, nausea, vomiting, or diarrhea. 4/28; the area on the right second toe is close to healed. Left medial ankle required debridement 12/07/2020 upon evaluation today patient appears to be doing well with regard to his wounds. In fact the right second toe appears to be completely healed which is great news. Fortunately there does not appear to be any signs of active infection at this time which is also great news. I think we can probably discontinue the gentamicin  on top of everything else. 12/14/2020 upon evaluation today patient appears to be doing well with regard to his wound. He is making good progress and overall very pleased with where things stand today. There is no signs of active infection at this time which is great news. 12/28/2020 upon evaluation today patient appears to be doing well with  regard to his wounds. He has been tolerating the dressing changes without complication. Fortunately there is no signs of active infection at this time. No fevers, chills, nausea, vomiting, or diarrhea. 12/28/2020 upon evaluation today patient's wound bed actually showed signs of excellent improvement. He has great epithelization and granulation I do not see any signs of infection overall I am extremely pleased with where things stand at this point. No fevers, chills, nausea, vomiting, or diarrhea. 01/11/2021 upon evaluation today patient appears to be doing well with regard to his wound on his leg. He has been tolerating the dressing changes without complication. Fortunately there does not appear to be any signs of active infection which is great news. No fevers, chills, nausea, vomiting, or diarrhea. 01/25/2021 upon evaluation today patient appears to be doing well with regard to his wound. He has been tolerating the dressing changes without complication. Fortunately the collagen seems to be doing a great job which is excellent news. No fevers, chills, nausea, vomiting, or diarrhea. 02/08/2021 upon evaluation today patient's wound is actually looking a little bit worse especially in the periwound compared to previous. Fortunately there does not appear to be any signs of infection which  is great news with that being said he does have some irritation around the periphery of the wound which has me more concerned. He actually had a dressing on that had not been changed in 3 days. He also is supposed to have daily dressing changes. With regard to the dressing applied he had a silver alginate dressing and silver collagen is what is recommended and ordered. He also had no Desitin around the edges of the wound in the periwound region although that is on the order inspect to be done as well. In general I was very concerned I did contact [facility] actually spoke with Sonny who is the scheduling individual  and subsequently she stated that she would pass the information to the D ON apparently the D ON was not available to talk to me when I call today. 02/18/2021 upon evaluation today patient's wound is actually showing signs of improvement. Fortunately there does not appear to be any evidence of infection which is great news overall I am extremely pleased with where things stand today. No fevers, chills, nausea, vomiting, or diarrhea. 8/3; patient presents for 1 week follow-up. He has no issues or complaints today. He denies signs of infection. 03/11/2021 upon evaluation today patient appears to be doing well with regard to his wound. He does have a little bit of slough noted on the surface of the wound but fortunately there does not appear to be any signs of active infection at this time. No fevers, chills, nausea, vomiting, or diarrhea. 03/18/2021 upon evaluation today patient appears to be doing well with regard to his wound. He has been tolerating the dressing changes without complication. There was a little irritation more proximal to where the wound was that was not noted last week but nonetheless this is very superficial just seems to be more irritation we just need to make sure to put a good amount of the zinc over the area in my opinion. Otherwise he does not seem to be doing significantly worse at all which is great news. 03/25/2021 upon evaluation today patient appears to be doing well with regard to his wound. He is going require some sharp debridement today to clear with some of the necrotic debris. I did perform this today without complication postdebridement wound bed appears to be doing much better this is great news. 04/08/2021 upon evaluation today patient appears to be doing decently well in regard to his wound although the overall measurement is not significantly smaller compared to previous. It is gone down a little bit but still the facility continues to not really put the appropriate  dressings in place in fact he was supposed to have collagen we think he probably had more of an allergy to At this point. Fortunately there does not appear to be any signs of active infection systemically though locally I do not see anything on initial visualization either as far as erythema or warmth. 04/15/2021 upon evaluation today patient appears to be doing well with regard to his wound. He is actually showing signs of improvement. I did place him on antibiotics last week, Cipro . He has been taking that 2 times a day and seems to be tolerating it very well. I do not see any evidence of worsening and in fact the overall appearance of the wound is smaller today which is also great news. 9/26; left medial ankle chronic venous insufficiency wound is improved. Using Hydrofera Blue 10/10; left medial ankle chronic venous insufficiency. Wound has not changed much in  appearance completely nonviable surface. Apparently there have been problems getting the right product on the wound at the facility although he came in with Hydrofera Blue on today 05/14/2021 upon evaluation today patient appears to be doing well with regard to his wound. I think he is making progress here which is good news. Fortunately there does not appear to be any signs of active infection at this time. No fevers, chills, nausea, vomiting, or diarrhea. Chad [name], Chad [name] ([id number]) [id number] Page 12 of 18 05/20/2021 upon evaluation today patient appears to be doing well with regard to his wound. He is showing signs of good improvement which is great news. There does not appear to be any evidence of active infection which is also excellent news. No fevers, chills, nausea, vomiting, or diarrhea. 05/28/2021 upon evaluation today patient appears to be doing quite well. There does not appear to be any signs of active infection at this time which is great news. Overall I am extremely pleased with where things stand  today. I think he is headed in the right direction. 06/11/2021 upon evaluation today patient appears to be doing well with regard to his left ankle ulcer and poorly in regard to the toe ulcer on the second toe right foot. This appears to show signs of joint exposure. Apparently this has been present for 1 to 2 months although he kept forgetting to tell me about it. That is unfortunate as right now it definitely appears to be doing significantly worse than what I would like to see. There does not appear to be any signs of active infection systemically though locally I am concerned about the possibility of infection the toe is quite red. Again no one from the facility ever contacted us  to advise that this was going on in the interim either. 06/17/2021 upon evaluation today patient presents for follow-up I did review his x-ray which showed a navicular bone fracture I am unsure of the chronicity of this. Subsequently he also had osteomyelitis of the toe which was what I was more concerned about this did not show up on x-ray but did show up on the pathology scrapings. This was listed as acute osteomyelitis. Nonetheless at this point I think that the antibiotic treatment is the best regimen to go with currently. The patient is in agreement with that plan. Nonetheless he has initially 30 days of doxycycline off likely extend that towards the end of the treatment cycle that will be around the middle of December for an additional 2 weeks. That all depends on how well he continues to heal. Nonetheless based on what I am seeing in the foot I did want a proceed with an MRI as well which I think will be helpful to identify if there is anything else that needs to be addressed from the standpoint of infection. 06/24/2021 upon evaluation today patient appears to be doing pretty well in regards to his wounds. I think both are actually showing signs of improvement which is good I did review his MRI today which did show  signs of osteomyelitis of the middle and proximal phalanx on his right foot of the affected toe. With that being said this is actually showing signs of significant improvement today already with the antibiotic therapy I think the redness is also improved. Overall I think that we just need to give this some time with appropriate wound care we will see how things go potentially hyperbarics could be considered. 07/02/2021 upon inspection today patient actually appears to be doing  well in regard to his left ankle which is getting very close to complete resolution of pleased in that regard. Unfortunately he is continuing to have issues with his second toe right foot and this seems to still be very painful for him. Recommend he try something different from the standpoint of antibiotics. 07/15/2021 upon evaluation today patient appears to be doing actually pretty well in regard to his foot. This is actually showing signs of significant improvement which is great news. Overall I feel like the patient is improving both in regard to the second toe as well as the ankle on the left. With that being said the biggest issue that I do see currently is that he is needing to have a refill of the doxycycline that we previously treated him with. He also did see podiatry they are not going to recommend any amputation at this point since he seems to be doing quite well. For that reason we just need to keep things under control from an infection standpoint. 08/01/2021 upon evaluation today patient appears to be doing well with regard to his wound. He has been tolerating the dressing changes without complication. Fortunately there does not appear to be any evidence of active infection locally nor systemically at this point. In fact I think everything is doing excellent in fact his second toe on the right foot is almost healed and the ankle on the left ankle region is actually very close to being healed as well. 08/08/2021 upon  evaluation today patient appears to be doing well with regard to his wound. He has been tolerating the dressing changes without complication. Fortunately I do not see any signs of active infection at this time. Readmission: 12-06-2021 upon evaluation today patient presents for reevaluation here in the clinic he does tell me that he was being seen in facility at [facility] by a provider that was coming in. He is not sure who this was. He tells me however that the wound seems to have gotten worse even compared to where it was when we last saw him at this point. With that being said I do believe that he is likely going need ongoing wound care here in the clinic and I do believe that we need to be the ones to frontline this since his wound does seem to be getting worse not better at this point. He voiced understanding. He is also in agreement with this plan and feels more comfortable coming here she tells me. Patient's medical history really has not changed since his prior admission he was only gone since January. 12-27-2021 upon evaluation today patient appears to be doing well with regard to his wound they did run out of the Hydrofera Blue so they did not put anything on just an ABD pad with gentamicin . Still we are seeing some signs of good improvement here with some new epithelization which is great news. 01-10-2022 upon evaluation today patient appears to be doing well with regard to his wounds and he is going require some sharp debridement but overall seems to be making good progress. Fortunately I do not see any evidence of active infection locally or systemically at this time which is great news. 01-24-2022 upon evaluation today patient appears to be doing well with regard to his wound. The facility actually came and dropped him off early and he had another appointment at the hospital and then they just brought him over here and this was still hours before his appointment this afternoon. For  that reason we did do our best to work him in this morning and fortunately had some space to make this happen. With that being said patient's wound does seem to be making progress here and I am very pleased in that regard I do not see any signs of active infection locally or systemically at this time. 02-07-2022 patient appears to be doing well currently in regard to his wounds. In fact one of them the more proximal is healed the distal is still open but seems to be doing excellent. Fortunately I do not see any evidence of active infection locally or systemically at this time which is great news. No fevers, chills, nausea, vomiting, or diarrhea. 7/27; left medial ankle venous. Improving per our intake nurse. We are using Hydrofera Blue under Tubigrip compression. They are changing that at his facility 03-06-2022 upon evaluation today patient appears to be doing well with regard to the his wound he is can require some sharp debridement but seems to be making excellent progress. Fortunately I do not see any evidence of active infection locally or systemically which is great news. 03-27-2022 upon evaluation today patient's wound is actually showing signs of excellent improvement. Fortunately I see no signs of active infection locally or systemically at this time which is great news. No fevers, chills, nausea, vomiting, or diarrhea. 04-21-2022 upon evaluation today patient appears to be doing excellent in regard to his wound in fact this is very close to resolution based on what I am seeing. I do not see any evidence of active infection locally or systemically at this time which is great news and overall I am extremely pleased with where we are today. 05-05-2022 upon evaluation today patient's wound actually appears to potentially be completely healed. Fortunately I do not see any evidence of active infection at this time which is great news and overall I am very pleased I think this needs a little time to  toughen up but other than that I really do believe were doing quite well. He is very pleased to hear this its been a long time coming. 05-19-2022 upon evaluation today patient appears to be doing well currently in regard to his wound. He in fact we were hoping will be completely healed and closed but it still has a very tiny area right in the center which is still continuing to drain. Fortunately I do not see any signs of infection locally or systemically which is great news. 11/6; this wound is close to completely he comes in this week with some erythema at 1 edge of this which looks like something was rubbing on the area. Other than that no evidence of infection 06-16-2022 upon evaluation today patient appears to be doing well currently in regard to his ankle ulcer. This is very close to being healed but still has a small Chad [name], LAMAR ([id number]) [id number] Page 13 of 18 area in the central portion which is not. Fortunately there does not appear to be any signs of infection locally or systemically which is great news. No fevers, chills, nausea, vomiting, or diarrhea. 06-30-2022 upon evaluation patient's wound pretty much appears to be almost completely closed. In fact it may even be closed but there are still a lot of skin irritation around the edges of the wound and I just do not feel great about releasing them yet I would like to continue to monitor this just a little bit longer before getting to that point. He is not opposed to this and in  fact is happy to continue to come in if need be in order to make sure that things are moving in the right direction he definitely does not want to backtrack. 07-14-2022 upon evaluation today patient appears to be doing well currently in regard to his wound. Has been tolerating the dressing changes without complication. Fortunately I see no evidence of active infection locally nor systemically which is great news. No fevers,  chills, nausea, vomiting, or diarrhea. 08-11-2022 upon evaluation today patient appears to be doing a little worse than last time I saw him although it has been a month. Unfortunately he was not able to be seen due to the fact that he was quarantined in the facility with COVID. Subsequently he tells me that he mention to the facility staff several times about taking it off over the past several weeks but they continue to tell him that someone have to get in touch with us  here although nobody ever did until Friday when the nurse manager called to have that documented in the communication notes. With that being said unfortunately this means that he had a wrap on that he was supposed to have on for only 1 week for ended up being on four 1 month essentially. I am glad that there is nothing worse than what we see currently to be perfectly honest. 08-19-2022 upon evaluation today patient appears to be doing well currently in regard to his wounds. He has been tolerating the dressing changes without complication. This is actually smaller today which is great news after last week's reopening. Fortunately I do not see any evidence of infection locally nor systemically at this point. 08-26-2022 upon evaluation today patient appears to be doing well currently in regard to his wound. He has been tolerating the dressing changes without complication. Fortunately there does not appear to be any signs of active infection locally nor systemically which is great news. No fevers, chills, nausea, vomiting, or diarrhea. 09-02-2022 upon evaluation today patient's wound actually showing signs of excellent improvement this is measuring smaller and looking much better. I am very pleased with where we stand I do believe that we are headed in the right direction. 09-09-2022 upon evaluation today patient appears to be doing decently well in regard to his leg in general although there was some swelling this is the 1 thing that I am not  too happy with based on what I see currently. Fortunately there does not appear to be any signs of infection locally nor systemically at this point. 09-16-2022 upon evaluation today patient appears to be doing well currently in regard to his wound. He is actually showing signs of improvement he wore the Tubigrip this week and it significantly better compared to last week's evaluation. Fortunately I do not see any evidence of active infection locally or systemically which is great news. Overall I think that were headed in the right direction which is great news. In fact overall I think this is the best that he has looked in some time. He does not like the Tubigrip but actually did extremely well with this in my opinion. I think he needs to continue to utilize this. 09-23-2022 upon evaluation today patient appears to be doing well currently in regard to his wound. He has been tolerating the dressing changes without complication. Fortunately there does not appear to be any signs of active infection locally nor systemically which is great news and in general I do feel like that we are headed in the right  direction. He has been having trouble with the Tubigrip however we have gotten the facility to order compression socks he has not gotten them as of yet. 3/4; patient is about the same with a wound on the left posterior leg. This is a venous wound. He needs more compression on this leg but he took off the previous wraps we are using. I am not certain whether he is using juxta lite stockings at the nursing home. 3/12; patient presents for follow-up. He unfortunately has 2 new wounds 1 to the dorsal left side and the other to the right heel. He is not sure how these happened. The right heel appears to be caused by pressure. He has been using Hydrofera Blue to the original wound on the left leg. 10-14-2022 upon evaluation today patient appears to be doing well currently in regard to his original wound but  unfortunately he has several new wounds which I was completely unaware of before walking into the room to see him today. Unfortunately he seems to not be doing nearly as well as what I saw when I last had an appointment with him 3 weeks ago. Fortunately I do not see any signs of systemic infection though unfortunately locally he has definite infection of the left lower extremity. He is currently on doxycycline I am going to likely have to extend that today. 10-21-2022 upon evaluation today patient appears to be doing well currently in regard to his wounds. He seems to be tolerating the dressing changes without complication. Fortunately there does not appear to be any signs of active infection locally or systemically which is great news and in general I definitely feel like we are on the right track here. 10-30-2022 upon evaluation today patient still continues to not be doing too well at all with regard to his wounds. Unfortunately he is still having signs of infection as well which also has been concerned. Fortunately I do not see any signs of active infection locally nor systemically at this time. No fevers, chills, nausea, vomiting, or diarrhea. 11-06-2022 upon evaluation today patient appears to be doing okay currently in regard to his wounds. All things considered with his blood flow not being nearly as good as what should I think that he is making pretty good progress here. Fortunately I do not see any signs of infection locally nor systemically which is great news. No fevers, chills, nausea, vomiting, or diarrhea. 11-13-2022 upon evaluation today patient appears to be doing poorly currently in regard to his wounds. Things seem to be getting a little bit worse not better. 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 Subsequently he is also going to need to have vascular intervention but we are unsure exactly  when this is going on the undertaking. I would try to actually send a message to Dr. Rodolph and the vascular team just to see what exactly the plan is going forward. The patient is in agreement with that plan I just think we need to have a plan especially in light of the fact the left leg seems to be getting a little bit worse at this point. 11-25-2022 upon evaluation today patient is currently now discharged from the hospital following his colostomy procedure. That did very well and he seems to be doing well. Fortunately there does not appear to be any signs of active infection locally or systemically which is great news.  No fevers, chills, nausea, vomiting, or diarrhea. 12-04-2022 upon evaluation today patient appears to be doing actually decently well in regard to his wounds and actually very pleased with where we stand and I think that he is making some pretty good progress here. Fortunately I do not see any signs of active infection locally nor systemically which is great news. No fevers, chills, nausea, vomiting, or diarrhea. 12-12-2022 upon evaluation today patient appears to be doing well currently in regard to his wound. He has been tolerating the dressing changes without complication. The wounds all seem to be making some progress to a degree. He has appointment with vascular on the 23rd of this month. 5/24; the patient was scheduled for a left lower extremity angiogram yesterday but apparently they were overbooked and they wanted to admit him to hospital last night so they could do this early this morning but he refused I see that Dr. Marea has rescheduled him for June 6. He is using silver alginate to wounds on his bilateral lower extremities. 01-02-2023 upon evaluation today patient appears to be doing poorly in regard to his wounds still. Subsequently he did have his angiogram but apparently there really was not much that was found that could be revascularized. The patient tells me that  Dr. Marea was going to contact me here in the office I have not heard from him as of yet and I may see about giving him a call just to follow-up or possibly send a message through epic which could be an option as well. Chad [name], Chad [name] ([id number]) [id number] Page 14 of 18 01-09-2023 upon evaluation today patient appears to be doing poorly in regard to his legs he is extremely swollen. He has been sleeping in his chair he tells me for the past week. His bed is broken and they have ordered a new one which is supposedly supposed to be there today. With that being said he the way he needs to be not sleeping in his chair that is his wheelchair every day and through the night. This is causing his legs to be much more swollen and it seen and how badly wounds are doing at this point. This has made things significantly worse. Fortunately I do not see any signs of infection right now unfortunately I do believe that the patient has a tremendous amount of drainage currently. 01-19-2023 upon evaluation today patient appears to be doing poorly in regard to his wounds of his legs his swelling is better but still not as good as what we would like to see. Fortunately I do not see any evidence of active infection locally nor systemically which is great news and in general I do believe that we are moving in the right direction here. Nonetheless it would be better if she was in the bed sleeping or not send up in his wheelchair although he seems to still be set up in the wheelchair at this time. He tells me that he is just not as comfortable in the bed as the wheelchair but unfortunately this is causing damage to his legs which I discussed with him today as well. Nonetheless I am not sure that he is going to be amendable to doing anything different. 01-27-2023 upon evaluation today patient appears to be doing well currently. Fortunately I do not see any evidence of active infection locally nor  systemically which is great news I think he is actually doing the best option and while in fact I would try to see if  we can do some debridement today to get things moving along. 02-03-2023 upon evaluation today patient's wounds unfortunately are not doing nearly as well. Will be doing the Foot locker wraps unfortunately we just not seeing the improvement that I would like to see. Fortunately I do not see any evidence of infection at this time which is good news unfortunately I do think that he needs to be changed on a daily basis due to the amount of drainage otherwise I do not think this is really going to continue to improve dramatically. 02-12-2023 upon evaluation today patient appears to be doing better in a lot of ways in regard to his wounds and although his toes on the left side actually seem a bit worse. Fortunately I do not see any evidence of active infection locally nor systemically at this time which is good news. With that being said he is having some bleeding from the left leg I think that this is an area that looks like it probably got bumped. With that being said I discussed this with the patient he tells me he does not know of any trauma that occurred but at the same time if was not this that it was skin that got pulled off. Dressing change there is only one of the 2 that could have happened. 7/25; this is a patient who is very frail at [facility]. He is nonambulatory. He has wounds on his bilateral lower legs. However much worse on the left we have been using silver cell to all of his wounds He comes in today and a lot more pain our intake nurse reports deterioration of the left foot. I ended up looking through some of his record. 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 I am really not certain of the stage of this currently or who is following 02-26-2023 upon evaluation today patient unfortunately appears to be doing significantly worse compared to where he was previous. The left foot is showing signs of dramatic worsening in regard to the overall appearance his toes are starting to become necrotic almost in full and he has much more cyanosis present than what I even seen previous. He is already seeing Dr. Marea and unfortunately there was not anything that they saw that was an immediate cause for this but at the same time I feel like this may have changed and even worsened I feel like this could be a vascular event and I believe this is a limb-threatening situation. 03/26/2023; I last saw this patient on 8/1. Wondered about below-knee amputations versus palliative care. 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 The wound has healed fairly well and he follows up with surgery today. He is back today looking at wounds on the right foot and lower leg. This includes 2 areas on the right heel, right dorsal foot right dorsal first and second toes and a small area on the right anterior lower tibia area. He has a lot of pain in this leg as well. The angiogram he had on  01/01/2023 specific to the right leg now demonstrated a fairly normal common femoral artery, profunda femoris artery and superficial and popliteal arteries without significant disease. 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 No doubt this has a lot to do with the discomfort in  the right leg he describes when he is in bed. They have been using Xeroform on the wounds on the right. He has a lot of swelling in this leg with weeping edema 04-09-2023 upon evaluation today patient appears to be doing poorly still in regard to his right leg. 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 Fortunately I do not see any signs of active infection locally or systemically at this time which is great news. No fevers, chills, nausea, vomiting, or diarrhea. With that being said I am concerned still about the fact that he seems to be getting worse especially in regard to the heels I think that he is still sitting up most of the day which I think is still causing some swelling of his leg unfortunately. 04-16-2023 upon evaluation today patient actually appears to be doing little bit more poorly compared to last week this actually does appear to be infected. Last week I really did not think that was the case but this seems like it may indeed be the case. Fortunately I do not see any signs of active infection locally or systemically at this time. 04-24-2023 upon evaluation today patient appears to be doing well currently in regard to his leg compared to where we were previous I do feel like that the antibiotics have been of benefit. Fortunately I do not see any signs of active infection at this time which is great news and in general I believe that we are making headway towards healing which is great news as well. With that being said he still has a lot of drainage I really feel like something like Jace would be better for him than just an ABD pad it would likely the drainage more or even Zetuvit's but I am not sure whether we can get either 1 of these at the facility for him. 05-01-2023 upon evaluation today patient appears to be doing well currently in regard to his wounds which are showing signs of significant improvement is not any way shape or form infected like it  was last time I saw him. I think the antibiotics are doing a good job but I think he may need an extension here to ensure that it completely clears. 05-08-2023 upon evaluation today patient appears to be doing well currently in regard to his wounds for the most part. He still has blue-green drainage noted but in general seems to be doing much better compared to where we were previous. Fortunately I do not see any signs of active infection locally or systemically which is great news and in general I do believe that we are making good headway towards complete closure. 05-18-2023 upon evaluation today patient appears to be doing well currently in regard to his wounds I feel like he is definitely making some improvements here. This is a slow improvement but nonetheless improvement. I do believe that he is doing quite well as far as infection is concerned which is also good news. 05-25-2023 upon evaluation today patient actually appears to be doing worse in regard to his legs at this point. Unfortunately  he continues to have issues with significant drainage which is the primary concern here. I do feel like that the wound currently is not doing a whole lot better when he was on the Levaquin  I felt like it was improving some but the concern there is QT prolongation I do not want to continue that personally further I think he needs a referral ASAP to infectious disease. I discussed that with him today. He also needs to be having this changed every day and that has not been happening at this point. That is my biggest concern here. 06-09-2023 upon evaluation today patient appears to be doing still poorly in regard to this right leg which is draining profusely. This is unfortunately just continuing to show signs of being very moist and wet and weeping quite significantly. This is despite everything that we are doing including the facility have [facility], [facility]AM[facility] ([id number]) [id number]  Page 15 of 18 been placed him on Levaquin  by ID which unfortunately just does not seem to be helping to heal this as well as we would hope either. I do have an infectious disease appointment that we sent 10 for him and that is actually scheduled for next week on the 19th. 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 06-17-2023 upon evaluation today patient appears to be doing still poorly in regard to his legs at this point. Unfortunately he still seems to be having issues with significant drainage we are having this changed every single day. With that being said despite this the patient just does not seem to be making a lot of headway here towards closure. We are trying to do what we can from the standpoint of compression as well. He did see infectious disease yesterday. I did review that note as well. Upon evaluation the note actually states that the patient told the physician do I have to keep suffering with wounds forevero Is there a way to get rid of these woundso However he did not want a referral to see Dr. Harden for potential amputation which seems to be 1 option that was proposed. With that being said he also apparently told her that he was adamant about not doing another course of antibiotics as he feels like they have not healed his wounds. It appears based on evaluation with regard to the record that the patient did have a discussion with her about the possibility of doing a short course of IV cefepime  for 14 days to cover Serratia as well as the other bacteria noted on culture. However the patient declined this as he felt like the antibiotics in the past he had taken did not help at all and there were no signs and symptoms to suggest sepsis. Therefore she recommended continuing wound care and to continue to follow-up with vascular follow-up in 2 weeks with her. I appreciate this evaluation  and again I cannot do anything with the fact that the patient did not want to proceed with any further treatment with regard to the bacterial infection. 06-23-2023 the patient seems to making some progress here in regard to his wound. I feel like it is still a tremendous amount of drainage but at the same time I feel like you are also showing some signs of significant improvement which is great news and very pleased with where things stand currently. Especially compared to where we have been. 07-07-2023 upon evaluation  today patient appears to be doing about the same currently in regard to his wounds. He does not seem quite as erythematous as he has been around the wounds but the wounds continue to get larger due to the swelling and the drainage. Again I think the moisture is causing additional and further breakdown which is unfortunate. With that being said I discussed with the patient that I do believe that the only way to get this better is going to be to keep the swelling under control and the drainage off of his skin. This is the hardest part to be sure. I think the alginate dressing is being used at the facility is actually keeping things more moist which is not ideal. 07-15-2023 upon evaluation today patient appears to be doing poorly still in regard to his wounds he still continues to have significant issues. Fortunately I do not see any signs of active infection which is great news and in general I am pleased in that regard but at the same time I do believe that he is not doing quite as well as he could be especially in light of the fact that he still did put the alginate on which is gets very wet and stays completely soaked on the wounds on trying to get this off and just use the XtraSorb which would think be much better. 12/31; this is a patient with combined severe PAD and venous insufficiency. He has very substantial wound areas involving the left lower leg especially medially left dorsal  foot. Weeping edema fluid. We have been using silver alginate Xtrasorb 08-10-2023 upon evaluation today patient appears to be doing poorly currently in regard to his wound. His wounds unfortunately despite what we are doing just seem to be getting worse and worse. I do not believe that he is moving in any way shape or form into the appropriate direction at this point and I do believe that he is going to require some intervention honestly at this point. We actually had a conversation that I was really hoping not to have today which was considering amputation of the right leg as he did with the left leg. This was a below-knee amputation. With that being said I am unsure that we will go be able to get this healed short of that. We have been really working hard trying to get things moving in the right direction including daily dressing changes and week by week the wound seem to keep getting worse. In fact he is pretty much at the point where next week working to reclassify these wounds as just to do different wounds 1 on the foot and 1 on the leg as they pretty much merged into 1 wound at this point. Objective Constitutional Well-nourished and well-hydrated in no acute distress. Vitals Time Taken: 9:43 AM, Height: 69 in, Weight: 150 lbs, BMI: 22.1, Temperature: 97.9 F, Pulse: 80 bpm, Respiratory Rate: 18 breaths/min, Blood Pressure: 99/67 mmHg. Respiratory normal breathing without difficulty. Psychiatric this patient is able to make decisions and demonstrates good insight into disease process. Alert and Oriented x 3. pleasant and cooperative. General Notes: Upon inspection patient's wounds unfortunately are not doing so well I did not perform any debridement he has some muscle and tendon noted on the anterior portion of the leg I feel like that this is really a vascular issue although has had interventions and is also had evaluation with vascular and there really was not much more they felt they could  do at that  point. With that being said I feel like he is probably can to be a candidate and gena require amputation on the right side as well. Integumentary (Hair, Skin) Wound #16 status is Open. Original cause of wound was Gradually Appeared. The date acquired was: 09/26/2022. The wound has been in treatment 43 weeks. The wound is located on the Right Calcaneus. The wound measures 12cm length x 11cm width x 0.2cm depth; 103.673cm^2 area and 20.735cm^3 volume. There is a medium amount of serosanguineous drainage noted. There is a medium (34-66%) amount of necrotic tissue within the wound bed including Adherent Slough. Wound #18 status is Open. Original cause of wound was Pressure Injury. The date acquired was: 09/26/2022. The wound has been in treatment 42 weeks. The wound is located on the Right,Medial T Second. The wound measures 2.5cm length x 2.2cm width x 0.2cm depth; 4.32cm^2 area and 0.864cm^3 volume. oe There is a medium amount of serosanguineous drainage noted. There is a medium (34-66%) amount of necrotic tissue within the wound bed including Adherent Slough. Wound #21 status is Open. Original cause of wound was Pressure Injury. The date acquired was: 03/02/2023. The wound has been in treatment 19 weeks. The wound is located on the Right,Dorsal Foot. The wound measures 5cm length x 5.3cm width x 0.2cm depth; 20.813cm^2 area and 4.163cm^3 volume. There is a medium amount of serous drainage noted. There is a medium (34-66%) amount of necrotic tissue within the wound bed including Adherent Slough. Wound #22 status is Open. Original cause of wound was Pressure Injury. The date acquired was: 03/02/2023. The wound has been in treatment 19 weeks. The wound is located on the Right,Anterior Lower Leg. The wound measures 13cm length x 11cm width x 0.2cm depth; 112.312cm^2 area and 22.462cm^3 volume. There is a medium amount of serous drainage noted. Wound #23 status is Open. Original cause of wound was  Pressure Injury. The date acquired was: 05/02/2023. The wound has been in treatment 13 weeks. The ARNAV, CREGG ([id number]) [id number] Page 16 of 18 wound is located on the Right,Posterior Lower Leg. The wound measures 7.5cm length x 9cm width x 0.2cm depth; 53.014cm^2 area and 10.603cm^3 volume. There is a medium amount of serosanguineous drainage noted. 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 Nurse Visit as needed Bathing/ Shower/ Hygiene: Wash wounds with antibacterial soap and water . - LEAVE OFF THE FOAM DRESSING. COVER WOUNDS WITH ABD PADS (DOUBLED IF NEEDED) AND WRAP WITH KERLIX. PUT TUBIGRIP OVER KERLIX. CHANGE DAILY No tub bath. Anesthetic (Use 'Patient Medications' Section for Anesthetic Order Entry): Lidocaine  applied to wound bed Off-Loading: Heel suspension boot - when in bed Turn and reposition every 2 hours WOUND #16: - Calcaneus Wound Laterality: Right Cleanser: Soap and Water  1 x Per Day/30 Days Discharge Instructions: Gently cleanse wound with antibacterial soap, rinse and pat dry prior to dressing wounds Cleanser: Wound Cleanser 1 x Per Day/30 Days Discharge Instructions: Wash your hands with soap and water . Remove old dressing, discard into plastic bag and place into trash. Cleanse the wound with Wound Cleanser prior to applying a clean dressing using gauze sponges, not tissues or cotton balls. Do  not scrub or use excessive force. Pat dry using gauze sponges, not tissue or cotton balls. Secondary Dressing: ABD Pad  5x9 (in/in) 1 x Per Day/30 Days Discharge Instructions: Cover with ABD pad Secondary Dressing: Heel Cup 1 x Per Day/30 Days Secured With: Kerlix Roll Sterile or Non-Sterile 6-ply 4.5x4 (yd/yd) 1 x Per Day/30 Days Discharge Instructions: Apply Kerlix as directed Secured With: Tubigrip Size D, 3x10 (in/yd) 1 x Per Day/30 Days WOUND #18: - T Second Wound Laterality: Right, Medial oe Cleanser: Soap and Water  1 x Per Day/30 Days Discharge Instructions: Gently cleanse wound with antibacterial soap, rinse and pat dry prior to dressing wounds Cleanser: Wound Cleanser 1 x Per Day/30 Days Discharge Instructions: Wash your hands with soap and water . Remove old dressing, discard into plastic bag and place into trash. Cleanse the wound with Wound Cleanser prior to applying a clean dressing using gauze sponges, not tissues or cotton balls. Do not scrub or use excessive force. Pat dry using gauze sponges, not tissue or cotton balls. Secondary Dressing: ABD Pad 5x9 (in/in) 1 x Per Day/30 Days Discharge Instructions: Cover with ABD pad Secondary Dressing: Heel Cup 1 x Per Day/30 Days Secured With: Kerlix Roll Sterile or Non-Sterile 6-ply 4.5x4 (yd/yd) 1 x Per Day/30 Days Discharge Instructions: Apply Kerlix as directed Secured With: Tubigrip Size D, 3x10 (in/yd) 1 x Per Day/30 Days WOUND #21: - Foot Wound Laterality: Dorsal, Right Cleanser: Soap and Water  1 x Per Day/30 Days Discharge Instructions: Gently cleanse wound with antibacterial soap, rinse and pat dry prior to dressing wounds Cleanser: Wound Cleanser 1 x Per Day/30 Days Discharge Instructions: Wash your hands with soap and water . Remove old dressing, discard into plastic bag and place into trash. Cleanse the wound with Wound Cleanser prior to applying a clean dressing using gauze sponges, not tissues or cotton balls. Do not scrub or use excessive force. Pat dry using gauze sponges, not tissue or cotton balls. Secondary Dressing: ABD Pad 5x9  (in/in) 1 x Per Day/30 Days Discharge Instructions: Cover with ABD pad Secondary Dressing: Heel Cup 1 x Per Day/30 Days Secured With: Kerlix Roll Sterile or Non-Sterile 6-ply 4.5x4 (yd/yd) 1 x Per Day/30 Days Discharge Instructions: Apply Kerlix as directed Secured With: Tubigrip Size D, 3x10 (in/yd) 1 x Per Day/30 Days WOUND #22: - Lower Leg Wound Laterality: Right, Anterior Cleanser: Soap and Water  1 x Per Day/30 Days Discharge Instructions: Gently cleanse wound with antibacterial soap, rinse and pat dry prior to dressing wounds Cleanser: Wound Cleanser 1 x Per Day/30 Days Discharge Instructions: Wash your hands with soap and water . Remove old dressing, discard into plastic bag and place into trash. Cleanse the wound with Wound Cleanser prior to applying a clean dressing using gauze sponges, not tissues or cotton balls. Do not scrub or use excessive force. Pat dry using gauze sponges, not tissue or cotton balls. Secondary Dressing: ABD Pad 5x9 (in/in) 1 x Per Day/30 Days Discharge Instructions: Cover with ABD pad Secondary Dressing: Heel Cup 1 x Per Day/30 Days Secured With: Kerlix Roll Sterile or Non-Sterile 6-ply 4.5x4 (yd/yd) 1 x Per Day/30 Days Discharge Instructions: Apply Kerlix as directed Secured With: Tubigrip Size D, 3x10 (in/yd) 1 x Per Day/30 Days WOUND #23: - Lower Leg Wound Laterality: Right, Posterior Cleanser: Soap and Water  1 x Per Day/30 Days AYMAN, BRULL ([id number]) [phone].pdf Page 17 of 18 Discharge Instructions: Gently cleanse wound with antibacterial soap, rinse and pat dry prior to dressing wounds Cleanser: Wound Cleanser 1 x Per Day/30  Days Discharge Instructions: Wash your hands with soap and water . Remove old dressing, discard into plastic bag and place into trash. Cleanse the wound with Wound Cleanser prior to applying a clean dressing using gauze sponges, not tissues or cotton balls. Do not scrub or use excessive force. Pat  dry using gauze sponges, not tissue or cotton balls. Secondary Dressing: ABD Pad 5x9 (in/in) 1 x Per Day/30 Days Discharge Instructions: Cover with ABD pad Secondary Dressing: Heel Cup 1 x Per Day/30 Days Secured With: Kerlix Roll Sterile or Non-Sterile 6-ply 4.5x4 (yd/yd) 1 x Per Day/30 Days Discharge Instructions: Apply Kerlix as directed Secured With: Tubigrip Size D, 3x10 (in/yd) 1 x Per Day/30 Days 1. I would recommend that we have the patient continue to monitor for any signs of infection or worsening. Based on what I am seeing right now we discussed proceeding with amputation and I think he is strongly considering that he is gena work on the motorized wheelchair which he is trying to get. 2. I am going to recommend that we use the ABD pad over top of the silver alginate this should be changed daily secured with rolled gauze and XtraSorb. 3. He is also to continue with the Tubigrip size D. We will see patient back for reevaluation in 1 week here in the clinic. If anything worsens or changes patient will contact our office for additional recommendations. Electronic Signature(s) Signed: 08/10/2023 2:48:32 PM By: Bethena Ferraris PA-C Entered By: Bethena Ferraris on 08/10/2023 14:48:32 -------------------------------------------------------------------------------- SuperBill Details Patient Name: Date of Service: JONTAVIOUS, COMMONS 08/10/2023 Medical Record Number: [id number] Patient Account Number: [id number] Date of Birth/Sex: Treating RN: 05/04/1961 (63 y.o. Chad [name] Primary Care Provider: Gerlean Maier Other Clinician: Referring Provider: Treating Provider/Extender: Bethena Ferraris Gerlean Maier Weeks in Treatment: 87 Diagnosis Coding ICD-10 Codes Code Description [phone] Chronic venous hypertension (idiopathic) with ulcer and inflammation of left lower extremity 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.518 Non-pressure chronic ulcer of other  part of right foot with other specified severity 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 foot 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, stage 3 Facility Procedures : CPT4 Code: 23899824 Description: 00784 - WOUND CARE VISIT-LEV 5 EST PT Modifier: Quantity: 1 Physician Procedures : CPT4: Description Modifier Code [id number] - WC PHYS LEVEL 3 - EST PT ICD-10 Diagnosis Description 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 (idiopathic) with ulcer and inflammation of left lower extremity L98.492 Non-pressure chronic ulcer of skin of other  sites with fat layer exposed 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 Quantity: 1 : [place] CPT4: H7788 Visit complexity inherent to [facility] w/medical care services that serve as the continuing focal point for ongoing , [place] ([id number]) [id number] P care related to a patient's condition ICD-10 Diagnosis  Description 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 (idiopathic) with ulcer and inflammation of left lower extremity 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 of right  foot with other specified severity 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 foot Quantity: 1 age 69 of 16 Electronic Signature(s) Signed: 08/10/2023 2:48:53 PM By: Bethena Ferraris PA-C Entered By: Bethena Ferraris on 08/10/2023 14:48:52

## 2023-08-13 LAB — HEPATITIS C RNA QUANTITATIVE
HCV Quantitative Log: 1.37 {Log} — ABNORMAL HIGH
HCV RNA, PCR, QN: 23 [IU]/mL — ABNORMAL HIGH

## 2023-08-14 ENCOUNTER — Inpatient Hospital Stay: Payer: Medicaid Other

## 2023-08-14 ENCOUNTER — Inpatient Hospital Stay: Payer: Medicaid Other | Attending: Internal Medicine | Admitting: Internal Medicine

## 2023-08-14 ENCOUNTER — Encounter: Payer: Self-pay | Admitting: Internal Medicine

## 2023-08-14 VITALS — BP 103/63 | HR 82 | Resp 18

## 2023-08-14 DIAGNOSIS — C187 Malignant neoplasm of sigmoid colon: Secondary | ICD-10-CM | POA: Diagnosis not present

## 2023-08-14 DIAGNOSIS — D500 Iron deficiency anemia secondary to blood loss (chronic): Secondary | ICD-10-CM

## 2023-08-14 DIAGNOSIS — I959 Hypotension, unspecified: Secondary | ICD-10-CM | POA: Diagnosis not present

## 2023-08-14 DIAGNOSIS — I739 Peripheral vascular disease, unspecified: Secondary | ICD-10-CM | POA: Diagnosis not present

## 2023-08-14 DIAGNOSIS — C661 Malignant neoplasm of right ureter: Secondary | ICD-10-CM | POA: Diagnosis not present

## 2023-08-14 DIAGNOSIS — Z8042 Family history of malignant neoplasm of prostate: Secondary | ICD-10-CM | POA: Diagnosis not present

## 2023-08-14 DIAGNOSIS — F17210 Nicotine dependence, cigarettes, uncomplicated: Secondary | ICD-10-CM | POA: Insufficient documentation

## 2023-08-14 DIAGNOSIS — D509 Iron deficiency anemia, unspecified: Secondary | ICD-10-CM | POA: Insufficient documentation

## 2023-08-14 DIAGNOSIS — Z8051 Family history of malignant neoplasm of kidney: Secondary | ICD-10-CM | POA: Insufficient documentation

## 2023-08-14 DIAGNOSIS — L03116 Cellulitis of left lower limb: Secondary | ICD-10-CM | POA: Diagnosis not present

## 2023-08-14 DIAGNOSIS — Z806 Family history of leukemia: Secondary | ICD-10-CM | POA: Insufficient documentation

## 2023-08-14 DIAGNOSIS — B182 Chronic viral hepatitis C: Secondary | ICD-10-CM | POA: Insufficient documentation

## 2023-08-14 LAB — CMP (CANCER CENTER ONLY)
ALT: 11 U/L (ref 0–44)
AST: 18 U/L (ref 15–41)
Albumin: 2.7 g/dL — ABNORMAL LOW (ref 3.5–5.0)
Alkaline Phosphatase: 88 U/L (ref 38–126)
Anion gap: 7 (ref 5–15)
BUN: 31 mg/dL — ABNORMAL HIGH (ref 8–23)
CO2: 17 mmol/L — ABNORMAL LOW (ref 22–32)
Calcium: 8.2 mg/dL — ABNORMAL LOW (ref 8.9–10.3)
Chloride: 110 mmol/L (ref 98–111)
Creatinine: 1.09 mg/dL (ref 0.61–1.24)
GFR, Estimated: >60 mL/min (ref >60)
Glucose, Bld: 135 mg/dL — ABNORMAL HIGH (ref 70–99)
Potassium: 3.3 mmol/L — ABNORMAL LOW (ref 3.5–5.1)
Sodium: 134 mmol/L — ABNORMAL LOW (ref 135–145)
Total Bilirubin: 0.2 mg/dL (ref 0.0–1.2)
Total Protein: 6.8 g/dL (ref 6.5–8.1)

## 2023-08-14 LAB — CBC WITH DIFFERENTIAL (CANCER CENTER ONLY)
Abs Immature Granulocytes: 0.03 10*3/uL (ref 0.00–0.07)
Basophils Absolute: 0.1 10*3/uL (ref 0.0–0.1)
Basophils Relative: 1 %
Eosinophils Absolute: 0.4 10*3/uL (ref 0.0–0.5)
Eosinophils Relative: 5 %
HCT: 22.9 % — ABNORMAL LOW (ref 39.0–52.0)
Hemoglobin: 7.1 g/dL — ABNORMAL LOW (ref 13.0–17.0)
Immature Granulocytes: 0 %
Lymphocytes Relative: 14 %
Lymphs Abs: 1.1 10*3/uL (ref 0.7–4.0)
MCH: 25.3 pg — ABNORMAL LOW (ref 26.0–34.0)
MCHC: 31 g/dL (ref 30.0–36.0)
MCV: 81.5 fL (ref 80.0–100.0)
Monocytes Absolute: 0.5 10*3/uL (ref 0.1–1.0)
Monocytes Relative: 7 %
Neutro Abs: 5.7 10*3/uL (ref 1.7–7.7)
Neutrophils Relative %: 73 %
Platelet Count: 204 10*3/uL (ref 150–400)
RBC: 2.81 MIL/uL — ABNORMAL LOW (ref 4.22–5.81)
RDW: 20 % — ABNORMAL HIGH (ref 11.5–15.5)
WBC Count: 7.7 10*3/uL (ref 4.0–10.5)
nRBC: 0 % (ref 0.0–0.2)

## 2023-08-14 LAB — IRON AND TIBC
Iron: 30 ug/dL — ABNORMAL LOW (ref 45–182)
Saturation Ratios: 11 % — ABNORMAL LOW (ref 17.9–39.5)
TIBC: 276 ug/dL (ref 250–450)
UIBC: 246 ug/dL

## 2023-08-14 LAB — VITAMIN D 25 HYDROXY (VIT D DEFICIENCY, FRACTURES): Vit D, 25-Hydroxy: 31.39 ng/mL (ref 30–100)

## 2023-08-14 LAB — FERRITIN: Ferritin: 47 ng/mL (ref 24–336)

## 2023-08-14 MED ORDER — IRON SUCROSE 20 MG/ML IV SOLN
200.0000 mg | Freq: Once | INTRAVENOUS | Status: AC
  Administered 2023-08-14: 200 mg via INTRAVENOUS

## 2023-08-14 MED ORDER — HEPARIN SOD (PORK) LOCK FLUSH 100 UNIT/ML IV SOLN
500.0000 [IU] | Freq: Once | INTRAVENOUS | Status: AC | PRN
  Administered 2023-08-14: 500 [IU]
  Filled 2023-08-14: qty 5

## 2023-08-14 NOTE — Patient Instructions (Signed)

## 2023-08-14 NOTE — Progress Notes (Signed)
[facility] CONSULT NOTE  Patient Care Team: Everardo Beals, MD as PCP - General (Family Medicine) Earna Coder, MD as Consulting Physician (Internal Medicine) Lemar Livings Merrily Pew, MD as Consulting Physician (General Surgery)  CHIEF COMPLAINTS/PURPOSE OF CONSULTATION: Urothelial cancer  #  Oncology History Overview Note  # SEP-OCT 2019-right renal pelvis/ ureteral [cytology positive HIGH grade urothelial carcinoma [Dr.Brandon]    # NOV 24th 2019-Keytruda [consent]  # April 2022- colonoscopy [Dr.Anna;incidental PET- sigmoid uptake] # Sigmoid colon cancer- [history of Lynch syndrome]- [Dr.Cintron; APRIL 2024]- s/p hemicolectomy- permanent ostomy; MUCINOUS ADENOCARCINOMA, 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  - ELEVEN LYMPH NODES, NEGATIVE FOR MALIGNANCY (0/11).  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  Is alone/in a wheelchair.  Chad [name] 63 y.o.  male with Lynch syndrome and above 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; recurrent sigmoid colon cancer -stage II and multiple other  comorbidities including PVD/active smoker most recently  Rande Lawman is here for follow-up. Rande Lawman is currently on HOLD given acute issues/PVD etc.   Patient is here for results of CT scan. States his energy is normal. Denies dyspnea.Still smoking; smoked 5 cigarettes in total yesterday. Appetite is good.   Patient denies any shortness of breath or cough or fevers.  No chills.   Patient currently following up with wound care for his nonhealing ulcers of his right lower extremity.  He states he is going to have his other leg, right leg amputated.  Currently awaiting evaluation with vascular surgery.   Waiting on an electric wheelchair to get to facility first. Ostomy draining normally. No blood in stools or black-colored stools.  No hematuria.  No abd pain. Voiding normal   Review of Systems  Constitutional:  Positive for malaise/fatigue. Negative for chills, diaphoresis, fever and weight loss.  HENT:  Negative for nosebleeds and sore throat.   Eyes:  Negative for double vision.  Respiratory:  Positive for cough and shortness of breath. Negative for hemoptysis and wheezing.   Cardiovascular:  Negative for chest pain, palpitations and orthopnea.  Gastrointestinal:  Positive for constipation. Negative for abdominal pain, blood in stool, diarrhea, heartburn, melena, nausea and vomiting.  Genitourinary:  Negative for dysuria, frequency and urgency.  Musculoskeletal:  Positive for back pain and joint pain.  Skin: Negative.  Negative for itching and rash.  Neurological:  Positive for focal weakness. Negative for dizziness, tingling, weakness and headaches.       Chronic left-sided weakness upper than lower extremity.  Endo/Heme/Allergies:  Does not bruise/bleed easily.  Psychiatric/Behavioral:  Negative for depression. The patient is not nervous/anxious and does  not have insomnia.      MEDICAL HISTORY:  Past Medical History:  Diagnosis Date   Anemia    Anxiety    ARF (acute respiratory  failure) (HCC)    Atherosclerosis of arteries of extremities (HCC)    Benign prostatic hyperplasia with urinary obstruction    Bladder cancer (HCC)    BPH with obstruction/lower urinary tract symptoms    Chronic viral hepatitis C (HCC)    Colon cancer (HCC)    COPD (chronic obstructive pulmonary disease) (HCC)    Depression    Dysphagia    Family history of colon cancer    Family history of kidney cancer    Family history of leukemia    Family history of prostate cancer    Generalized anxiety disorder    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    GERD (gastroesophageal reflux disease)    Hepatitis    chronic hep c   Hydronephrosis    Hydronephrosis with ureteral stricture    Hyperlipidemia    Insomnia, unspecified    Knee pain    Left   Major depressive disorder, recurrent, moderate (HCC)    Malignant neoplasm of colon (HCC)    Malnutrition (HCC)    Muscle weakness (generalized)    Nerve pain    Other abnormalities of gait and mobility    Other chronic pain    Peripheral vascular disease (HCC)    Personal history of transient cerebral ischemia    Pressure ulcer    Prostate cancer (HCC)    Stroke (HCC)    Tinea unguium    Tobacco user    Unspecified protein-calorie malnutrition (HCC)    Ureteral cancer, right (HCC)    Urinary frequency    Venous hypertension of both lower extremities    Xerosis cutis     SURGICAL HISTORY: Past Surgical History:  Procedure Laterality Date   AMPUTATION Left 03/03/2023   Procedure: AMPUTATION BELOW KNEE;  Surgeon: Renford Dills, MD;  Location: [facility] ORS;  Service: Vascular;  Laterality: Left;   COLON SURGERY     En bloc extended right hemicolectomy 07/2017   COLONOSCOPY WITH PROPOFOL N/A 11/06/2020   Procedure: COLONOSCOPY WITH PROPOFOL;  Surgeon: Wyline Mood, MD;  Location: [facility] ENDOSCOPY;  Service: Gastroenterology;  Laterality: N/A;   COLONOSCOPY WITH PROPOFOL N/A 07/31/2021   Procedure: COLONOSCOPY WITH PROPOFOL;   Surgeon: Earline Mayotte, MD;  Location: [facility] ENDOSCOPY;  Service: Endoscopy;  Laterality: N/A;   CYSTOSCOPY W/ RETROGRADES Right 08/30/2018   Procedure: CYSTOSCOPY WITH RETROGRADE PYELOGRAM;  Surgeon: Vanna Scotland, MD;  Location: [facility];  Service: Urology;  Laterality: Right;   CYSTOSCOPY WITH STENT PLACEMENT Right 04/25/2018   Procedure: CYSTOSCOPY WITH STENT PLACEMENT;  Surgeon: Vanna Scotland, MD;  Location: [facility];  Service: Urology;  Laterality: Right;   CYSTOSCOPY WITH STENT PLACEMENT Right 08/30/2018   Procedure: CYSTOSCOPY WITH STENT Exchange;  Surgeon: Vanna Scotland, MD;  Location: [facility];  Service: Urology;  Laterality: Right;   CYSTOSCOPY WITH STENT PLACEMENT Right 03/07/2019   Procedure: CYSTOSCOPY WITH STENT Exchange;  Surgeon: Vanna Scotland, MD;  Location: [facility];  Service: Urology;  Laterality: Right;   CYSTOSCOPY WITH STENT PLACEMENT Right 11/21/2019   Procedure: CYSTOSCOPY WITH STENT Exchange;  Surgeon: Vanna Scotland, MD;  Location: [facility];  Service: Urology;  Laterality: Right;   LOWER EXTREMITY ANGIOGRAPHY Left 05/23/2019   Procedure: LOWER EXTREMITY ANGIOGRAPHY;  Surgeon: Annice Needy, MD;  Location: [facility];  Service: Cardiovascular;  Laterality: Left;   LOWER EXTREMITY ANGIOGRAPHY Right 05/30/2019   Procedure: LOWER EXTREMITY ANGIOGRAPHY;  Surgeon: Annice Needy, MD;  Location: [facility];  Service: Cardiovascular;  Laterality: Right;   LOWER EXTREMITY ANGIOGRAPHY Right 02/13/2020   Procedure: LOWER EXTREMITY ANGIOGRAPHY;  Surgeon: Annice Needy, MD;  Location: [facility];  Service: Cardiovascular;  Laterality: Right;   LOWER EXTREMITY ANGIOGRAPHY Left 02/20/2020   Procedure: LOWER EXTREMITY ANGIOGRAPHY;  Surgeon: Annice Needy, MD;  Location: [facility];  Service: Cardiovascular;  Laterality: Left;   LOWER EXTREMITY ANGIOGRAPHY Left 01/01/2023   Procedure: Lower Extremity Angiography;  Surgeon: Annice Needy, MD;   Location: [facility];  Service: Cardiovascular;  Laterality: Left;   PORTA CATH INSERTION N/A 02/28/2019   Procedure: PORTA CATH INSERTION;  Surgeon: Annice Needy, MD;  Location: [facility];  Service: Cardiovascular;  Laterality: N/A;   ROBOT ASSISTED LAPAROSCOPIC PARTIAL COLECTOMY  11/17/2022   tumor removed       SOCIAL HISTORY: Social History   Socioeconomic History   Marital status: Single    Spouse name: Not on file   Number of children: Not on file   Years of education: Not on file   Highest education level: Not on file  Occupational History   Not on file  Tobacco Use   Smoking status: Every Day    Current packs/day: 0.50    Types: Cigarettes    Passive exposure: Never   Smokeless tobacco: Never  Vaping Use   Vaping status: Never Used  Substance and Sexual Activity   Alcohol use: Not Currently   Drug use: Not Currently    Types: Marijuana, Methylphenidate    Comment: quit 1997-1998 ish   Sexual activity: Not Currently  Other Topics Concern   Not on file  Social History Narrative    used [place] live [place]; moved  [place]- end of April 2019; in Nursing home; 1pp/day; quit alcohol. 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        Family- dad- prostate ca [at 78y]; brother- 17 died of prostate cancer; brother- 29- no cancers [[place]]; sonThayer Ohm [[place]];[name]-32y prostate ca [facility] ([place] [place]]; daughter- 64 [[place]]; another daughter 77 [[place]/addict]. will refer genetics counseling. Given MSI- abnormal; highly suspicious of Lynch syndrome.  Patient's son Cristal Deer aware of high possible lynch syndrome.   Social Drivers of Corporate investment banker Strain: Not on file  Food Insecurity: No Food Insecurity (02/26/2023)   Hunger Vital Sign    Worried About Running Out of Food in the Last Year: Never true    Ran Out of Food in the Last Year: Never true  Transportation Needs: No Transportation Needs (02/26/2023)   PRAPARE - Administrator, Civil Service (Medical): No     Lack of Transportation (Non-Medical): No  Physical Activity: Not on file  Stress: Not on file  Social Connections: Not on file  Intimate Partner Violence: Not At Risk (02/26/2023)   Humiliation, Afraid, Rape, and Kick questionnaire    Fear of Current or Ex-Partner: No    Emotionally Abused: No    Physically Abused: No    Sexually Abused: No    FAMILY HISTORY: Family History  Problem Relation Age of Onset   Prostate cancer Father 51   Cancer Brother 76       unsure type   Cancer Paternal Uncle        unsure type   Cancer  Maternal Grandmother        unsure type   Cancer Paternal Grandmother        unsure type   Kidney cancer Paternal Grandfather    Cancer Other        unsure types   Leukemia Son    Cancer Son        other cancers, possibly colon    ALLERGIES:  is allergic to penicillins.  MEDICATIONS:  Current Outpatient Medications  Medication Sig Dispense Refill   acetaminophen (TYLENOL) 325 MG tablet Take 650 mg by mouth 3 (three) times daily.      Amino Acids-Protein Hydrolys (PRO-STAT) LIQD Take 45 mLs by mouth daily.     ascorbic acid (VITAMIN C) 500 MG tablet Take 1 tablet (500 mg total) by mouth 2 (two) times daily.     aspirin EC 81 MG tablet Take 1 tablet (81 mg total) by mouth daily. 150 tablet 2   atorvastatin (LIPITOR) 10 MG tablet Take 1 tablet (10 mg total) by mouth daily. 30 tablet 11   Calcium Carb-Cholecalciferol (CALCIUM CARBONATE-VITAMIN D3 PO) Take 2 tablets by mouth daily. 600-400 mg     clopidogrel (PLAVIX) 75 MG tablet Take 75 mg by mouth daily.     cyclobenzaprine (FEXMID) 7.5 MG tablet Take 15 mg by mouth at bedtime.     escitalopram (LEXAPRO) 5 MG tablet Take 5 mg by mouth daily.     ferrous fumarate (FERRETTS) 325 (106 Fe) MG TABS tablet Take 1 tablet by mouth.     gabapentin (NEURONTIN) 100 MG capsule Take 100 mg by mouth 3 (three) times daily.     ibuprofen (ADVIL) 400 MG tablet Take 1 tablet (400 mg total) by mouth every 6 (six) hours as  needed for mild pain or moderate pain.     mirtazapine (REMERON) 7.5 MG tablet Take 7.5 mg by mouth at bedtime.     Multiple Vitamin (MULTIVITAMIN) tablet Take 1 tablet by mouth daily.     oxyCODONE 10 MG TABS Take 1-1.5 tablets (10-15 mg total) by mouth every 4 (four) hours as needed for severe pain or moderate pain. 30 tablet 0   senna-docusate (SENOKOT-S) 8.6-50 MG tablet Take 1 tablet by mouth daily.     Sofosbuvir-Velpatasvir (EPCLUSA) 400-100 MG TABS Take 1 tablet by mouth daily. 28 tablet 2   tamsulosin (FLOMAX) 0.4 MG CAPS capsule Take 1 capsule (0.4 mg total) by mouth daily after supper. 30 capsule 3   traMADol (ULTRAM) 50 MG tablet Take 1 tablet (50 mg total) by mouth every 6 (six) hours as needed. 20 tablet 0   Carboxymethylcellulose Sodium (REFRESH LIQUIGEL) 1 % GEL 1 gtt OS only daily     leptospermum manuka honey (MEDIHONEY) PSTE paste Apply 1 Application topically daily.     Polyethyl Glycol-Propyl Glycol (SYSTANE) 0.4-0.3 % SOLN Apply to eye.     potassium chloride SA (KLOR-CON M) 20 MEQ tablet Take 2 tablets (40 mEq total) by mouth daily.     No current facility-administered medications for this visit.   Facility-Administered Medications Ordered in Other Visits  Medication Dose Route Frequency Provider Last Rate Last Admin   heparin lock flush 100 UNIT/ML injection               .  PHYSICAL EXAMINATION: ECOG PERFORMANCE STATUS: 1 - Symptomatic but completely ambulatory  Vitals:   08/14/23 0943  BP: (!) 88/58  Pulse: 79  Resp: 18  Temp: 98.7 F (37.1 C)  SpO2: 100%  Filed Weights   Left lower extremity- s/p BKA.   # R LE- swelling/ in wrap.   Physical Exam HENT:     Head: Normocephalic and atraumatic.     Mouth/Throat:     Pharynx: No oropharyngeal exudate.  Eyes:     Pupils: Pupils are equal, round, and reactive to light.  Cardiovascular:     Rate and Rhythm: Normal rate and regular rhythm.  Pulmonary:     Effort: No respiratory distress.      Breath sounds: No wheezing.  Abdominal:     General: Bowel sounds are normal. There is no distension.     Palpations: Abdomen is soft. There is no mass.     Tenderness: There is no abdominal tenderness. There is no guarding or rebound.  Musculoskeletal:        General: No tenderness. Normal range of motion.     Cervical back: Normal range of motion and neck supple.  Skin:    General: Skin is warm.     Comments: Bilateral lower extremity ulcerations noted.  Pulses intact bilaterally.  Cystlike lesion noted right elbow-nontender.  No erythema noted.  Neurological:     Mental Status: He is oriented to person, place, and time.     Comments: Sleepy but easily arousable.  Chronic weakness left upper and lower extremity.  Psychiatric:        Mood and Affect: Affect normal.      LABORATORY DATA:  I have reviewed the data as listed Lab Results  Component Value Date   WBC 7.7 08/14/2023   HGB 7.1 (L) 08/14/2023   HCT 22.9 (L) 08/14/2023   MCV 81.5 08/14/2023   PLT 204 08/14/2023   Recent Labs    03/31/23 0915 05/15/23 1004 06/16/23 0151 07/01/23 1359 08/14/23 0924  NA 136 137 138 137 134*  K 4.0 3.8 4.4 4.1 3.3*  CL 110 109 111* 110 110  CO2 21* 23 17* 20 17*  GLUCOSE 103* 104* 81 116* 135*  BUN 22 20 23 21  31*  CREATININE 0.84 0.75 0.84 1.07 1.09  CALCIUM 8.3* 8.2* 8.7 8.6 8.2*  GFRNONAA >60 >60  --   --  >60  PROT 6.5 7.0  --  7.0 6.8  ALBUMIN 2.8* 2.9*  --   --  2.7*  AST 30 25  --  17 18  ALT 21 13  --  8*  10 11  ALKPHOS 88 104  --   --  88  BILITOT 0.3 0.4  --  0.2 0.2    ASSESSMENT & PLAN:   Ureteral cancer, right (HCC) # High-grade urothelial cancer/cytology; likely of the right renal pelvis /upper ureter. Most recently on [place]; currently on HOLD. AJN 3rd, 2025- Extensive surgical changes along the bowel once again identified with diverting colostomy and rectosigmoid stump; Severe atrophy of the right kidney with patulous collecting system and indwelling  ureteral stent. Persistent asymmetric wall thickening along the urinary bladder posteriorly on the right side. Right renal and bladder calculi; Few scattered prominent lymph nodes seen once again in the chest, abdomen and pelvis. The majority are similar to previous. There is 1 node in left pelvic sidewall near the margin of the rectosigmoid stump which is slightly larger today at 10 mm.  # CONTINUE TO HOLD keytruda for now [given acute issues].  Labs today reviewed- hemglobin is worse- see below  # # Anemia-OCT- iron studies-33 %; ferritin 34 -hemoglobin 7.7 multifactorial anemia of chronic disease/IDA- proceed with venofer.   #  Mild Hypotension- asymptomatic- not on ant-HTN- recommend  monitoring for now.    # CT JAN 2025- incidental  developing parenchymal opacity identified along the dependent right lower lobe with some minimal areas seen in the right upper lobe- monitor for now. No clinical signs of pneumonia.   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 - ELEVEN LYMPH NODES, NEGATIVE FOR MALIGNANCY (0/11). - SURGICAL MARGINS FREE OF DYSPLASIA AND MALIGNANCY. S/p staple removal.-- stable.  # Hepatitis C- [ID] on treatment.   # Hypocalcemia- Mild- on Ca+vit D; G+FEB 2024- 42. Stable  # active smoker:  Counseled against smoking; patient-not interested in quitting.   # IV access: port access   #DISPOSITION: # venofer;  # follow up in 2 weeks- APP- labs/port- Labs- cbc- Venofer # follow up in 3  months- MD- port/labs/port flush--cbc/cmp;iron studies; ferritin; Vit D 25-OH;  possible venofer; NO chemo- Dr.B.   # I  reviewed the blood work- with the patient in detail; also reviewed the imaging independently [as summarized above]; and with the patient in detail.    All questions were answered. The patient knows to call the clinic with any problems, questions or concerns.    Earna Coder, MD 08/14/2023 11:03 AM

## 2023-08-14 NOTE — Progress Notes (Signed)
Here for results of CT scan. States his energy is normal. Denies dyspnea.Still smoking; smoked 5 cigarettes in total yesterday. Appetite is good. He states he is going to have his other leg, right leg amputated. Waiting on an electric wheelchair to get to facility first. Ostomy draining normally. No abd pain. Voiding normally.

## 2023-08-14 NOTE — Assessment & Plan Note (Addendum)
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 Most recently on [place]; currently on HOLD. AJN 3rd, 2025- Extensive surgical changes along the bowel once again identified with diverting colostomy and rectosigmoid stump; Severe atrophy of the right kidney with patulous collecting system and indwelling ureteral stent. Persistent asymmetric wall thickening along the urinary bladder posteriorly on the right side. Right renal and bladder calculi; Few scattered prominent lymph nodes seen once again in the chest, abdomen and pelvis. The majority are similar to previous. There is 1 node in left pelvic sidewall near the margin of the rectosigmoid stump which is slightly larger today at 10 mm.  # CONTINUE TO HOLD keytruda for now [given acute issues].  Labs today reviewed- hemglobin is worse- see below  # # Anemia-OCT- iron studies-33 %; ferritin 34 -hemoglobin 7.7 multifactorial anemia of chronic disease/IDA- proceed with venofer.   # Mild Hypotension- asymptomatic- not on ant-HTN- recommend  monitoring for now.    # CT JAN 2025- incidental  developing parenchymal opacity identified along the dependent right lower lobe with some minimal areas seen in the right upper lobe- monitor for now. No clinical signs of pneumonia.   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 - ELEVEN LYMPH NODES, NEGATIVE FOR MALIGNANCY (0/11). - SURGICAL MARGINS FREE OF DYSPLASIA AND MALIGNANCY. S/p staple removal.-- stable.  # Hepatitis C- [ID] on treatment.   # Hypocalcemia- Mild- on Ca+vit D; G+FEB 2024- 42.  Stable  # active smoker:  Counseled against smoking; patient-not interested in quitting.   # IV access: port access   #DISPOSITION: # venofer;  # follow up in 2 weeks- APP- labs/port- Labs- cbc- Venofer # follow up in 3  months- MD- port/labs/port flush--cbc/cmp;iron studies; ferritin; Vit D 25-OH;  possible venofer; NO chemo- Dr.B.   # I reviewed the blood work- with the patient in detail; also reviewed the imaging independently [as summarized above]; and with the patient in detail.

## 2023-08-18 ENCOUNTER — Ambulatory Visit: Payer: Medicaid Other | Admitting: Physician Assistant

## 2023-08-25 ENCOUNTER — Encounter: Payer: Medicaid Other | Admitting: Physician Assistant

## 2023-08-25 DIAGNOSIS — I87332 Chronic venous hypertension (idiopathic) with ulcer and inflammation of left lower extremity: Secondary | ICD-10-CM | POA: Diagnosis not present

## 2023-08-27 ENCOUNTER — Other Ambulatory Visit: Payer: Self-pay

## 2023-08-28 ENCOUNTER — Inpatient Hospital Stay: Payer: Medicaid Other

## 2023-08-28 ENCOUNTER — Telehealth: Payer: Self-pay | Admitting: Internal Medicine

## 2023-08-28 ENCOUNTER — Inpatient Hospital Stay: Payer: Medicaid Other | Admitting: Nurse Practitioner

## 2023-08-28 ENCOUNTER — Encounter: Payer: Self-pay | Admitting: Nurse Practitioner

## 2023-08-28 NOTE — Telephone Encounter (Signed)
Called facility x2 to see if patient was on the way for his appointments - no answer at facility- no voicemail. Letter sent for noshow to get him rescheduled

## 2023-08-31 ENCOUNTER — Other Ambulatory Visit (HOSPITAL_COMMUNITY): Payer: Self-pay

## 2023-08-31 ENCOUNTER — Other Ambulatory Visit: Payer: Self-pay

## 2023-08-31 NOTE — Progress Notes (Signed)
Specialty Pharmacy Refill Coordination Note  Chad [name] is a 63 y.o. male, Nurse Helmut Muster at [facility] was  contacted today regarding refills of specialty medication(s) Sofosbuvir-Velpatasvir   Patient requested Delivery   Delivery date: 09/02/23   Verified address: [address]   [name] [phone]   Medication will be filled on 09/01/23.   [facility] is looking into this fill as it appears to have been filled at another pharmacy. [facility] is aware of this possible delay.

## 2023-08-31 NOTE — Progress Notes (Signed)
Specialty medication filled at [facility] and was sent to patient. Patient is being disenrolled, patient is completing treatment at alternative pharmacy.

## 2023-09-01 ENCOUNTER — Ambulatory Visit: Payer: Medicaid Other | Admitting: Physician Assistant

## 2023-09-08 ENCOUNTER — Ambulatory Visit: Payer: Medicaid Other | Admitting: Physician Assistant

## 2023-09-08 ENCOUNTER — Other Ambulatory Visit (INDEPENDENT_AMBULATORY_CARE_PROVIDER_SITE_OTHER): Payer: Self-pay | Admitting: Nurse Practitioner

## 2023-09-08 DIAGNOSIS — I739 Peripheral vascular disease, unspecified: Secondary | ICD-10-CM

## 2023-09-08 DIAGNOSIS — Z89512 Acquired absence of left leg below knee: Secondary | ICD-10-CM

## 2023-09-09 ENCOUNTER — Encounter (INDEPENDENT_AMBULATORY_CARE_PROVIDER_SITE_OTHER): Payer: Self-pay | Admitting: Nurse Practitioner

## 2023-09-09 ENCOUNTER — Ambulatory Visit (INDEPENDENT_AMBULATORY_CARE_PROVIDER_SITE_OTHER): Payer: Medicaid Other | Admitting: Nurse Practitioner

## 2023-09-09 ENCOUNTER — Ambulatory Visit (INDEPENDENT_AMBULATORY_CARE_PROVIDER_SITE_OTHER): Payer: Medicaid Other

## 2023-09-09 ENCOUNTER — Other Ambulatory Visit (INDEPENDENT_AMBULATORY_CARE_PROVIDER_SITE_OTHER): Payer: Self-pay | Admitting: Nurse Practitioner

## 2023-09-09 VITALS — BP 102/68 | HR 76 | Resp 15

## 2023-09-09 DIAGNOSIS — Z89512 Acquired absence of left leg below knee: Secondary | ICD-10-CM | POA: Diagnosis not present

## 2023-09-09 DIAGNOSIS — Z9889 Other specified postprocedural states: Secondary | ICD-10-CM

## 2023-09-09 DIAGNOSIS — I739 Peripheral vascular disease, unspecified: Secondary | ICD-10-CM

## 2023-09-09 DIAGNOSIS — S81801A Unspecified open wound, right lower leg, initial encounter: Secondary | ICD-10-CM

## 2023-09-10 ENCOUNTER — Telehealth (INDEPENDENT_AMBULATORY_CARE_PROVIDER_SITE_OTHER): Payer: Self-pay

## 2023-09-10 NOTE — Telephone Encounter (Signed)
I attempted to contact Heide Guile at [facility] to schedule the patient for a right BKA with Dr. Gilda Crease. A message was left for a return call.

## 2023-09-11 LAB — AEROBIC CULTURE

## 2023-09-13 ENCOUNTER — Other Ambulatory Visit (INDEPENDENT_AMBULATORY_CARE_PROVIDER_SITE_OTHER): Payer: Self-pay | Admitting: Nurse Practitioner

## 2023-09-13 DIAGNOSIS — S81801A Unspecified open wound, right lower leg, initial encounter: Secondary | ICD-10-CM

## 2023-09-14 ENCOUNTER — Encounter (INDEPENDENT_AMBULATORY_CARE_PROVIDER_SITE_OTHER): Payer: Self-pay | Admitting: Nurse Practitioner

## 2023-09-14 ENCOUNTER — Inpatient Hospital Stay: Admission: RE | Admit: 2023-09-14 | Payer: Medicaid Other | Source: Ambulatory Visit

## 2023-09-14 NOTE — Progress Notes (Signed)
Subjective:    Patient ID: Chad [name], male    DOB: 21-Aug-1960, 63 y.o.   MRN: [id number] Chief Complaint  Patient presents with   Follow-up    3 mont rle abi follow up    The patient is a 63 year old male who presents today for follow-up evaluation of his peripheral arterial disease.  The patient has been dealing with a longstanding ulceration of his right heel and other areas of his right lower extremity.  He has been receiving wound care from [facility] and is also underwent angiogram.  Angiogram was failed that he had widely patent proximal vessels and occlusion of the anterior and posterior tibial arteries.  He had a one-vessel runoff.  At that time it was not felt that his right lower extremity was amenable to intervention.  They have continued with wound care but it has not improved the status of his wound at all.  He previously underwent a left below-knee amputation and actually has done well with this.  Until recently he developed a wound which I suspect is more so a pressure wound.  Given that he has done well with his left below-knee amputation site, he wishes to move forward with amputation of his right lower extremity    Review of Systems  Cardiovascular:  Positive for leg swelling.  Skin:  Positive for wound.  Neurological:  Positive for weakness.  All other systems reviewed and are negative.      Objective:   Physical Exam Vitals reviewed.  HENT:     Head: Normocephalic.  Cardiovascular:     Rate and Rhythm: Normal rate.  Pulmonary:     Effort: Pulmonary effort is normal.  Skin:    General: Skin is warm and dry.  Neurological:     Mental Status: He is alert and oriented to person, place, and time.     Motor: Weakness present.  Psychiatric:        Mood and Affect: Mood normal.        Behavior: Behavior normal.        Thought Content: Thought content normal.        Judgment: Judgment normal.     BP 102/68   Pulse 76   Resp 15   Past Medical History:   Diagnosis Date   Anemia    Anxiety    ARF (acute respiratory failure) (HCC)    Atherosclerosis of arteries of extremities (HCC)    Benign prostatic hyperplasia with urinary obstruction    Bladder cancer (HCC)    BPH with obstruction/lower urinary tract symptoms    Chronic viral hepatitis C (HCC)    Colon cancer (HCC)    COPD (chronic obstructive pulmonary disease) (HCC)    Depression    Dysphagia    Family history of colon cancer    Family history of kidney cancer    Family history of leukemia    Family history of prostate cancer    Generalized anxiety disorder    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    GERD (gastroesophageal reflux disease)    Hepatitis    chronic hep c   Hydronephrosis    Hydronephrosis with ureteral stricture    Hyperlipidemia    Insomnia, unspecified    Knee pain    Left   Major depressive disorder, recurrent, moderate (HCC)    Malignant neoplasm of colon (HCC)    Malnutrition (HCC)    Muscle weakness (generalized)    Nerve pain    Other  abnormalities of gait and mobility    Other chronic pain    Peripheral vascular disease (HCC)    Personal history of transient cerebral ischemia    Pressure ulcer    Prostate cancer (HCC)    Stroke (HCC)    Tinea unguium    Tobacco user    Unspecified protein-calorie malnutrition (HCC)    Ureteral cancer, right (HCC)    Urinary frequency    Venous hypertension of both lower extremities    Xerosis cutis     Social History   Socioeconomic History   Marital status: Single    Spouse name: Not on file   Number of children: Not on file   Years of education: Not on file   Highest education level: Not on file  Occupational History   Not on file  Tobacco Use   Smoking status: Every Day    Current packs/day: 0.50    Types: Cigarettes    Passive exposure: Never   Smokeless tobacco: Never  Vaping Use   Vaping status: Never Used  Substance and Sexual Activity   Alcohol use: Not Currently    Drug use: Not Currently    Types: Marijuana, Methylphenidate    Comment: quit 1997-1998 ish   Sexual activity: Not Currently  Other Topics Concern   Not on file  Social History Narrative    used to live [place]; moved  To [place]- end of April 2019; in Nursing home; 1pp/day; quit alcohol. 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        Family- dad- prostate ca [at 78y]; brother- 16 died of prostate cancer; brother- 71- no cancers [[place]]; sonThayer Ohm [[place]];[name]-32y prostate ca [facility] [place]]; daughter- 91 [[place]]; another daughter 13 [[place]/addict]. will refer genetics counseling. Given MSI- abnormal; highly suspicious of Lynch syndrome.  Patient's son Cristal Deer aware of high possible lynch syndrome.   Social Drivers of Corporate investment banker Strain: Not on file  Food Insecurity: No Food Insecurity (02/26/2023)   Hunger Vital Sign    Worried About Running Out of Food in the Last Year: Never true    Ran Out of Food in the Last Year: Never true  Transportation Needs: No Transportation Needs (02/26/2023)   PRAPARE - Administrator, Civil Service (Medical): No    Lack of Transportation (Non-Medical): No  Physical Activity: Not on file  Stress: Not on file  Social Connections: Not on file  Intimate Partner Violence: Not At Risk (02/26/2023)   Humiliation, Afraid, Rape, and Kick questionnaire    Fear of Current or Ex-Partner: No    Emotionally Abused: No    Physically Abused: No    Sexually Abused: No    Past Surgical History:  Procedure Laterality Date   AMPUTATION Left 03/03/2023   Procedure: AMPUTATION BELOW KNEE;  Surgeon: Renford Dills, MD;  Location: [facility];  Service: Vascular;  Laterality: Left;   COLON SURGERY     En bloc extended right hemicolectomy 07/2017   COLONOSCOPY WITH PROPOFOL N/A 11/06/2020   Procedure: COLONOSCOPY WITH PROPOFOL;  Surgeon: Wyline Mood, MD;  Location: [facility]COPY;  Service: Gastroenterology;  Laterality: N/A;   COLONOSCOPY WITH PROPOFOL N/A  07/31/2021   Procedure: COLONOSCOPY WITH PROPOFOL;  Surgeon: Earline Mayotte, MD;  Location: [facility] ENDOSCOPY;  Service: Endoscopy;  Laterality: N/A;   CYSTOSCOPY W/ RETROGRADES Right 08/30/2018   Procedure: CYSTOSCOPY WITH RETROGRADE PYELOGRAM;  Surgeon: Vanna Scotland, MD;  Location: [facility];  Service: Urology;  Laterality: Right;  CYSTOSCOPY WITH STENT PLACEMENT Right 04/25/2018   Procedure: CYSTOSCOPY WITH STENT PLACEMENT;  Surgeon: Vanna Scotland, MD;  Location: [facility];  Service: Urology;  Laterality: Right;   CYSTOSCOPY WITH STENT PLACEMENT Right 08/30/2018   Procedure: CYSTOSCOPY WITH STENT Exchange;  Surgeon: Vanna Scotland, MD;  Location: [facility];  Service: Urology;  Laterality: Right;   CYSTOSCOPY WITH STENT PLACEMENT Right 03/07/2019   Procedure: CYSTOSCOPY WITH STENT Exchange;  Surgeon: Vanna Scotland, MD;  Location: [facility];  Service: Urology;  Laterality: Right;   CYSTOSCOPY WITH STENT PLACEMENT Right 11/21/2019   Procedure: CYSTOSCOPY WITH STENT Exchange;  Surgeon: Vanna Scotland, MD;  Location: [facility];  Service: Urology;  Laterality: Right;   LOWER EXTREMITY ANGIOGRAPHY Left 05/23/2019   Procedure: LOWER EXTREMITY ANGIOGRAPHY;  Surgeon: Annice Needy, MD;  Location: [facility];  Service: Cardiovascular;  Laterality: Left;   LOWER EXTREMITY ANGIOGRAPHY Right 05/30/2019   Procedure: LOWER EXTREMITY ANGIOGRAPHY;  Surgeon: Annice Needy, MD;  Location: [facility];  Service: Cardiovascular;  Laterality: Right;   LOWER EXTREMITY ANGIOGRAPHY Right 02/13/2020   Procedure: LOWER EXTREMITY ANGIOGRAPHY;  Surgeon: Annice Needy, MD;  Location: [facility];  Service: Cardiovascular;  Laterality: Right;   LOWER EXTREMITY ANGIOGRAPHY Left 02/20/2020   Procedure: LOWER EXTREMITY ANGIOGRAPHY;  Surgeon: Annice Needy, MD;  Location: [facility];  Service: Cardiovascular;  Laterality: Left;   LOWER EXTREMITY ANGIOGRAPHY Left 01/01/2023   Procedure:  Lower Extremity Angiography;  Surgeon: Annice Needy, MD;  Location: [facility];  Service: Cardiovascular;  Laterality: Left;   PORTA CATH INSERTION N/A 02/28/2019   Procedure: PORTA CATH INSERTION;  Surgeon: Annice Needy, MD;  Location: [facility];  Service: Cardiovascular;  Laterality: N/A;   ROBOT ASSISTED LAPAROSCOPIC PARTIAL COLECTOMY  11/17/2022   tumor removed       Family History  Problem Relation Age of Onset   Prostate cancer Father 76   Cancer Brother 45       unsure type   Cancer Paternal Uncle        unsure type   Cancer Maternal Grandmother        unsure type   Cancer Paternal Grandmother        unsure type   Kidney cancer Paternal Grandfather    Cancer Other        unsure types   Leukemia Son    Cancer Son        other cancers, possibly colon    Allergies  Allergen Reactions   Penicillins Rash       Latest Ref Rng & Units 08/14/2023    9:24 AM 07/01/2023    1:59 PM 06/16/2023    1:51 AM  CBC  WBC 4.0 - 10.5 K/uL 7.7  6.9  8.3   Hemoglobin 13.0 - 17.0 g/dL 7.1  9.9  53.6   Hematocrit 39.0 - 52.0 % 22.9  32.4  33.5   Platelets 150 - 400 K/uL 204  271  325       CMP     Component Value Date/Time   NA 134 (L) 08/14/2023 0924   K 3.3 (L) 08/14/2023 0924   CL 110 08/14/2023 0924   CO2 17 (L) 08/14/2023 0924   GLUCOSE 135 (H) 08/14/2023 0924   BUN 31 (H) 08/14/2023 0924   CREATININE 1.09 08/14/2023 0924   CREATININE 1.07 07/01/2023 1359   CALCIUM 8.2 (L) 08/14/2023 0924   PROT 6.8  08/14/2023 0924   ALBUMIN 2.7 (L) 08/14/2023 0924   AST 18 08/14/2023 0924   ALT 11 08/14/2023 0924   ALT 10 07/01/2023 1359   ALKPHOS 88 08/14/2023 0924   BILITOT 0.2 08/14/2023 0924   GFRNONAA >60 08/14/2023 0924     VAS [place] ABI WITH/WO TBI Result Date: 09/11/2023 LOWER EXTREMITY ARTERIAL DUPLEX STUDY Patient Name:  BLAIRE HODSDON  Date of Exam:   09/09/2023 Medical Rec #: [id number]        Accession #:    [id number] Date of Birth: 1961/03/29          Patient Gender: M Patient Age:   43 years Exam Location:  [facility] Procedure:      VAS [place] LOWER EXTREMITY ARTERIAL DUPLEX Referring Phys: Sheppard Plumber --------------------------------------------------------------------------------  Indications: Peripheral artery disease.  Vascular Interventions: 05/23/2019 PTA left PTA.                         05/30/2019 PTA right distal PTA.                         02/13/2020 PTA of right PTA.                         02/20/2020 Catheter placement of SFA. Current ABI:            Not Obtained Comparison Study: 11/05/2022 Performing Technologist: Debbe Bales RVS  Examination Guidelines: A complete evaluation includes B-mode imaging, spectral Doppler, color Doppler, and power Doppler as needed of all accessible portions of each vessel. Bilateral testing is considered an integral part of a complete examination. Limited examinations for reoccurring indications may be performed as noted.  +----------+--------+-----+--------+---------+--------+ RIGHT     PSV cm/sRatioStenosisWaveform Comments +----------+--------+-----+--------+---------+--------+ CFA Distal67                   triphasic         +----------+--------+-----+--------+---------+--------+ DFA       65                   triphasic         +----------+--------+-----+--------+---------+--------+ SFA Prox  96                   triphasic         +----------+--------+-----+--------+---------+--------+ SFA Mid   107                  biphasic          +----------+--------+-----+--------+---------+--------+ SFA Distal63                   biphasic          +----------+--------+-----+--------+---------+--------+ POP Distal65                   biphasic          +----------+--------+-----+--------+---------+--------+ ATA Prox  65                   biphasic          +----------+--------+-----+--------+---------+--------+ PTA Prox  111                  biphasic           +----------+--------+-----+--------+---------+--------+  Summary: Right: Imaging and Waveforms obtained but limited due to Bandaged lower Leg (Mid Calf to Foot). Biphasic Waveforms seen predominantly in the Right  Lower Extremity.  See table(s) above for measurements and observations. Electronically signed by Festus Barren MD on 09/11/2023 at 9:49:43 AM.    Final        Assessment & Plan:   1. Non-healing wound of right lower extremity (Primary) The patient has had longstanding issues with healing of his right lower extremity.  Despite excellent wound care received by the wound clinic the patient's wounds have failed to heal and they have continued to persist.  His recent angiogram notes that he had widely patent vessels down to the level of the tibials.  Today he also has widely patent vessels with primarily triphasic/biphasic waveforms.  At this time the patient would like to move forward with amputation because his left below-knee amputation has done well.  Currently the patient is not ambulatory and we have discussed and above-knee amputation site given the issues with his below-knee amputation currently however the patient would like to move forward with a below-knee amputation of the right.  I have also discussed that given his contracture of his knees use of a prosthetic will be difficult, but even with this discussion he still remains insistent on a below-knee amputation.  2. Hx of BKA, left (HCC) The patient has a wound on the anterior portion of his left below-knee amputation site.  This is not a wound of the incision line.  He does not have a prosthetic yet.  I suspect that this is a pressure ulcer from remaining in bed which is also exacerbated by this limb remaining in a more dependent position.  Will have him return to the wound care center for treatment and evaluation.  Wound was also cultured today. - Ambulatory referral to Wound Clinic   Current Outpatient Medications on File Prior to  Visit  Medication Sig Dispense Refill   acetaminophen (TYLENOL) 325 MG tablet Take 650 mg by mouth 3 (three) times daily.      Amino Acids-Protein Hydrolys (PRO-STAT) LIQD Take 45 mLs by mouth daily.     ascorbic acid (VITAMIN C) 500 MG tablet Take 1 tablet (500 mg total) by mouth 2 (two) times daily.     aspirin EC 81 MG tablet Take 1 tablet (81 mg total) by mouth daily. 150 tablet 2   atorvastatin (LIPITOR) 10 MG tablet Take 1 tablet (10 mg total) by mouth daily. 30 tablet 11   carboxymethylcellulose 1 % ophthalmic solution Place 1 drop into the left eye at bedtime.     clopidogrel (PLAVIX) 75 MG tablet Take 75 mg by mouth daily.     cyclobenzaprine (FEXMID) 7.5 MG tablet Take 15 mg by mouth at bedtime.     escitalopram (LEXAPRO) 5 MG tablet Take 5 mg by mouth daily.     ferrous fumarate (FERRETTS) 325 (106 Fe) MG TABS tablet Take 1 tablet by mouth daily.     gabapentin (NEURONTIN) 300 MG capsule Take 300 mg by mouth 2 (two) times daily.     ibuprofen (ADVIL) 400 MG tablet Take 1 tablet (400 mg total) by mouth every 6 (six) hours as needed for mild pain or moderate pain. (Patient not taking: Reported on 09/11/2023)     leptospermum manuka honey (MEDIHONEY) PSTE paste Apply 1 Application topically daily. (Patient not taking: Reported on 09/11/2023)     mirtazapine (REMERON) 7.5 MG tablet Take 7.5 mg by mouth at bedtime.     Multiple Vitamin (MULTIVITAMIN) tablet Take 1 tablet by mouth daily.     oxyCODONE 10 MG TABS Take 1-1.5  tablets (10-15 mg total) by mouth every 4 (four) hours as needed for severe pain or moderate pain. (Patient taking differently: Take 10 mg by mouth every 4 (four) hours.) 30 tablet 0   Polyethyl Glycol-Propyl Glycol (SYSTANE) 0.4-0.3 % SOLN Place 1 drop into both eyes daily.     potassium chloride SA (KLOR-CON M) 20 MEQ tablet Take 2 tablets (40 mEq total) by mouth daily. (Patient not taking: Reported on 09/11/2023)     senna-docusate (SENOKOT-S) 8.6-50 MG tablet Take 1  tablet by mouth daily.     Sofosbuvir-Velpatasvir (EPCLUSA) 400-100 MG TABS Take 1 tablet by mouth daily. 28 tablet 2   tamsulosin (FLOMAX) 0.4 MG CAPS capsule Take 1 capsule (0.4 mg total) by mouth daily after supper. (Patient taking differently: Take 0.4 mg by mouth every evening.) 30 capsule 3   traMADol (ULTRAM) 50 MG tablet Take 1 tablet (50 mg total) by mouth every 6 (six) hours as needed. 20 tablet 0   Current Facility-Administered Medications on File Prior to Visit  Medication Dose Route Frequency Provider Last Rate Last Admin   heparin lock flush 100 UNIT/ML injection             There are no Patient Instructions on file for this visit. No follow-ups on file.   Georgiana Spinner, NP

## 2023-09-14 NOTE — H&P (View-Only) (Signed)
 Subjective:    Patient ID: Chad [name], male    DOB: 21-Aug-1960, 63 y.o.   MRN: [id number] Chief Complaint  Patient presents with   Follow-up    3 mont rle abi follow up    The patient is a 63 year old male who presents today for follow-up evaluation of his peripheral arterial disease.  The patient has been dealing with a longstanding ulceration of his right heel and other areas of his right lower extremity.  He has been receiving wound care from [facility] and is also underwent angiogram.  Angiogram was failed that he had widely patent proximal vessels and occlusion of the anterior and posterior tibial arteries.  He had a one-vessel runoff.  At that time it was not felt that his right lower extremity was amenable to intervention.  They have continued with wound care but it has not improved the status of his wound at all.  He previously underwent a left below-knee amputation and actually has done well with this.  Until recently he developed a wound which I suspect is more so a pressure wound.  Given that he has done well with his left below-knee amputation site, he wishes to move forward with amputation of his right lower extremity    Review of Systems  Cardiovascular:  Positive for leg swelling.  Skin:  Positive for wound.  Neurological:  Positive for weakness.  All other systems reviewed and are negative.      Objective:   Physical Exam Vitals reviewed.  HENT:     Head: Normocephalic.  Cardiovascular:     Rate and Rhythm: Normal rate.  Pulmonary:     Effort: Pulmonary effort is normal.  Skin:    General: Skin is warm and dry.  Neurological:     Mental Status: He is alert and oriented to person, place, and time.     Motor: Weakness present.  Psychiatric:        Mood and Affect: Mood normal.        Behavior: Behavior normal.        Thought Content: Thought content normal.        Judgment: Judgment normal.     BP 102/68   Pulse 76   Resp 15   Past Medical History:   Diagnosis Date   Anemia    Anxiety    ARF (acute respiratory failure) (HCC)    Atherosclerosis of arteries of extremities (HCC)    Benign prostatic hyperplasia with urinary obstruction    Bladder cancer (HCC)    BPH with obstruction/lower urinary tract symptoms    Chronic viral hepatitis C (HCC)    Colon cancer (HCC)    COPD (chronic obstructive pulmonary disease) (HCC)    Depression    Dysphagia    Family history of colon cancer    Family history of kidney cancer    Family history of leukemia    Family history of prostate cancer    Generalized anxiety disorder    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    GERD (gastroesophageal reflux disease)    Hepatitis    chronic hep c   Hydronephrosis    Hydronephrosis with ureteral stricture    Hyperlipidemia    Insomnia, unspecified    Knee pain    Left   Major depressive disorder, recurrent, moderate (HCC)    Malignant neoplasm of colon (HCC)    Malnutrition (HCC)    Muscle weakness (generalized)    Nerve pain    Other  abnormalities of gait and mobility    Other chronic pain    Peripheral vascular disease (HCC)    Personal history of transient cerebral ischemia    Pressure ulcer    Prostate cancer (HCC)    Stroke (HCC)    Tinea unguium    Tobacco user    Unspecified protein-calorie malnutrition (HCC)    Ureteral cancer, right (HCC)    Urinary frequency    Venous hypertension of both lower extremities    Xerosis cutis     Social History   Socioeconomic History   Marital status: Single    Spouse name: Not on file   Number of children: Not on file   Years of education: Not on file   Highest education level: Not on file  Occupational History   Not on file  Tobacco Use   Smoking status: Every Day    Current packs/day: 0.50    Types: Cigarettes    Passive exposure: Never   Smokeless tobacco: Never  Vaping Use   Vaping status: Never Used  Substance and Sexual Activity   Alcohol use: Not Currently    Drug use: Not Currently    Types: Marijuana, Methylphenidate    Comment: quit 1997-1998 ish   Sexual activity: Not Currently  Other Topics Concern   Not on file  Social History Narrative    used to live [place]; moved  To [place]- end of April 2019; in Nursing home; 1pp/day; quit alcohol. 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        Family- dad- prostate ca [at 78y]; brother- 16 died of prostate cancer; brother- 71- no cancers [[place]]; sonThayer Ohm [[place]];[name]-32y prostate ca [facility] [place]]; daughter- 91 [[place]]; another daughter 13 [[place]/addict]. will refer genetics counseling. Given MSI- abnormal; highly suspicious of Lynch syndrome.  Patient's son Cristal Deer aware of high possible lynch syndrome.   Social Drivers of Corporate investment banker Strain: Not on file  Food Insecurity: No Food Insecurity (02/26/2023)   Hunger Vital Sign    Worried About Running Out of Food in the Last Year: Never true    Ran Out of Food in the Last Year: Never true  Transportation Needs: No Transportation Needs (02/26/2023)   PRAPARE - Administrator, Civil Service (Medical): No    Lack of Transportation (Non-Medical): No  Physical Activity: Not on file  Stress: Not on file  Social Connections: Not on file  Intimate Partner Violence: Not At Risk (02/26/2023)   Humiliation, Afraid, Rape, and Kick questionnaire    Fear of Current or Ex-Partner: No    Emotionally Abused: No    Physically Abused: No    Sexually Abused: No    Past Surgical History:  Procedure Laterality Date   AMPUTATION Left 03/03/2023   Procedure: AMPUTATION BELOW KNEE;  Surgeon: Renford Dills, MD;  Location: [facility];  Service: Vascular;  Laterality: Left;   COLON SURGERY     En bloc extended right hemicolectomy 07/2017   COLONOSCOPY WITH PROPOFOL N/A 11/06/2020   Procedure: COLONOSCOPY WITH PROPOFOL;  Surgeon: Wyline Mood, MD;  Location: [facility] ENDOSCOPY;  Service: Gastroenterology;  Laterality: N/A;   COLONOSCOPY WITH PROPOFOL N/A  07/31/2021   Procedure: COLONOSCOPY WITH PROPOFOL;  Surgeon: Earline Mayotte, MD;  Location: [facility] ENDOSCOPY;  Service: Endoscopy;  Laterality: N/A;   CYSTOSCOPY W/ RETROGRADES Right 08/30/2018   Procedure: CYSTOSCOPY WITH RETROGRADE PYELOGRAM;  Surgeon: Vanna Scotland, MD;  Location: [facility];  Service: Urology;  Laterality: Right;  CYSTOSCOPY WITH STENT PLACEMENT Right 04/25/2018   Procedure: CYSTOSCOPY WITH STENT PLACEMENT;  Surgeon: Vanna Scotland, MD;  Location: [facility];  Service: Urology;  Laterality: Right;   CYSTOSCOPY WITH STENT PLACEMENT Right 08/30/2018   Procedure: CYSTOSCOPY WITH STENT Exchange;  Surgeon: Vanna Scotland, MD;  Location: [facility];  Service: Urology;  Laterality: Right;   CYSTOSCOPY WITH STENT PLACEMENT Right 03/07/2019   Procedure: CYSTOSCOPY WITH STENT Exchange;  Surgeon: Vanna Scotland, MD;  Location: [facility];  Service: Urology;  Laterality: Right;   CYSTOSCOPY WITH STENT PLACEMENT Right 11/21/2019   Procedure: CYSTOSCOPY WITH STENT Exchange;  Surgeon: Vanna Scotland, MD;  Location: [facility];  Service: Urology;  Laterality: Right;   LOWER EXTREMITY ANGIOGRAPHY Left 05/23/2019   Procedure: LOWER EXTREMITY ANGIOGRAPHY;  Surgeon: Annice Needy, MD;  Location: [facility];  Service: Cardiovascular;  Laterality: Left;   LOWER EXTREMITY ANGIOGRAPHY Right 05/30/2019   Procedure: LOWER EXTREMITY ANGIOGRAPHY;  Surgeon: Annice Needy, MD;  Location: [facility];  Service: Cardiovascular;  Laterality: Right;   LOWER EXTREMITY ANGIOGRAPHY Right 02/13/2020   Procedure: LOWER EXTREMITY ANGIOGRAPHY;  Surgeon: Annice Needy, MD;  Location: [facility];  Service: Cardiovascular;  Laterality: Right;   LOWER EXTREMITY ANGIOGRAPHY Left 02/20/2020   Procedure: LOWER EXTREMITY ANGIOGRAPHY;  Surgeon: Annice Needy, MD;  Location: [facility];  Service: Cardiovascular;  Laterality: Left;   LOWER EXTREMITY ANGIOGRAPHY Left 01/01/2023   Procedure:  Lower Extremity Angiography;  Surgeon: Annice Needy, MD;  Location: [facility];  Service: Cardiovascular;  Laterality: Left;   PORTA CATH INSERTION N/A 02/28/2019   Procedure: PORTA CATH INSERTION;  Surgeon: Annice Needy, MD;  Location: [facility];  Service: Cardiovascular;  Laterality: N/A;   ROBOT ASSISTED LAPAROSCOPIC PARTIAL COLECTOMY  11/17/2022   tumor removed       Family History  Problem Relation Age of Onset   Prostate cancer Father 76   Cancer Brother 45       unsure type   Cancer Paternal Uncle        unsure type   Cancer Maternal Grandmother        unsure type   Cancer Paternal Grandmother        unsure type   Kidney cancer Paternal Grandfather    Cancer Other        unsure types   Leukemia Son    Cancer Son        other cancers, possibly colon    Allergies  Allergen Reactions   Penicillins Rash       Latest Ref Rng & Units 08/14/2023    9:24 AM 07/01/2023    1:59 PM 06/16/2023    1:51 AM  CBC  WBC 4.0 - 10.5 K/uL 7.7  6.9  8.3   Hemoglobin 13.0 - 17.0 g/dL 7.1  9.9  53.6   Hematocrit 39.0 - 52.0 % 22.9  32.4  33.5   Platelets 150 - 400 K/uL 204  271  325       CMP     Component Value Date/Time   NA 134 (L) 08/14/2023 0924   K 3.3 (L) 08/14/2023 0924   CL 110 08/14/2023 0924   CO2 17 (L) 08/14/2023 0924   GLUCOSE 135 (H) 08/14/2023 0924   BUN 31 (H) 08/14/2023 0924   CREATININE 1.09 08/14/2023 0924   CREATININE 1.07 07/01/2023 1359   CALCIUM 8.2 (L) 08/14/2023 0924   PROT 6.8  08/14/2023 0924   ALBUMIN 2.7 (L) 08/14/2023 0924   AST 18 08/14/2023 0924   ALT 11 08/14/2023 0924   ALT 10 07/01/2023 1359   ALKPHOS 88 08/14/2023 0924   BILITOT 0.2 08/14/2023 0924   GFRNONAA >60 08/14/2023 0924     VAS [place] ABI WITH/WO TBI Result Date: 09/11/2023 LOWER EXTREMITY ARTERIAL DUPLEX STUDY Patient Name:  BLAIRE HODSDON  Date of Exam:   09/09/2023 Medical Rec #: [id number]        Accession #:    [id number] Date of Birth: 1961/03/29          Patient Gender: M Patient Age:   43 years Exam Location:  [facility] Procedure:      VAS [place] LOWER EXTREMITY ARTERIAL DUPLEX Referring Phys: Sheppard Plumber --------------------------------------------------------------------------------  Indications: Peripheral artery disease.  Vascular Interventions: 05/23/2019 PTA left PTA.                         05/30/2019 PTA right distal PTA.                         02/13/2020 PTA of right PTA.                         02/20/2020 Catheter placement of SFA. Current ABI:            Not Obtained Comparison Study: 11/05/2022 Performing Technologist: Debbe Bales RVS  Examination Guidelines: A complete evaluation includes B-mode imaging, spectral Doppler, color Doppler, and power Doppler as needed of all accessible portions of each vessel. Bilateral testing is considered an integral part of a complete examination. Limited examinations for reoccurring indications may be performed as noted.  +----------+--------+-----+--------+---------+--------+ RIGHT     PSV cm/sRatioStenosisWaveform Comments +----------+--------+-----+--------+---------+--------+ CFA Distal67                   triphasic         +----------+--------+-----+--------+---------+--------+ DFA       65                   triphasic         +----------+--------+-----+--------+---------+--------+ SFA Prox  96                   triphasic         +----------+--------+-----+--------+---------+--------+ SFA Mid   107                  biphasic          +----------+--------+-----+--------+---------+--------+ SFA Distal63                   biphasic          +----------+--------+-----+--------+---------+--------+ POP Distal65                   biphasic          +----------+--------+-----+--------+---------+--------+ ATA Prox  65                   biphasic          +----------+--------+-----+--------+---------+--------+ PTA Prox  111                  biphasic           +----------+--------+-----+--------+---------+--------+  Summary: Right: Imaging and Waveforms obtained but limited due to Bandaged lower Leg (Mid Calf to Foot). Biphasic Waveforms seen predominantly in the Right  Lower Extremity.  See table(s) above for measurements and observations. Electronically signed by Festus Barren MD on 09/11/2023 at 9:49:43 AM.    Final        Assessment & Plan:   1. Non-healing wound of right lower extremity (Primary) The patient has had longstanding issues with healing of his right lower extremity.  Despite excellent wound care received by the wound clinic the patient's wounds have failed to heal and they have continued to persist.  His recent angiogram notes that he had widely patent vessels down to the level of the tibials.  Today he also has widely patent vessels with primarily triphasic/biphasic waveforms.  At this time the patient would like to move forward with amputation because his left below-knee amputation has done well.  Currently the patient is not ambulatory and we have discussed and above-knee amputation site given the issues with his below-knee amputation currently however the patient would like to move forward with a below-knee amputation of the right.  I have also discussed that given his contracture of his knees use of a prosthetic will be difficult, but even with this discussion he still remains insistent on a below-knee amputation.  2. Hx of BKA, left (HCC) The patient has a wound on the anterior portion of his left below-knee amputation site.  This is not a wound of the incision line.  He does not have a prosthetic yet.  I suspect that this is a pressure ulcer from remaining in bed which is also exacerbated by this limb remaining in a more dependent position.  Will have him return to the wound care center for treatment and evaluation.  Wound was also cultured today. - Ambulatory referral to Wound Clinic   Current Outpatient Medications on File Prior to  Visit  Medication Sig Dispense Refill   acetaminophen (TYLENOL) 325 MG tablet Take 650 mg by mouth 3 (three) times daily.      Amino Acids-Protein Hydrolys (PRO-STAT) LIQD Take 45 mLs by mouth daily.     ascorbic acid (VITAMIN C) 500 MG tablet Take 1 tablet (500 mg total) by mouth 2 (two) times daily.     aspirin EC 81 MG tablet Take 1 tablet (81 mg total) by mouth daily. 150 tablet 2   atorvastatin (LIPITOR) 10 MG tablet Take 1 tablet (10 mg total) by mouth daily. 30 tablet 11   carboxymethylcellulose 1 % ophthalmic solution Place 1 drop into the left eye at bedtime.     clopidogrel (PLAVIX) 75 MG tablet Take 75 mg by mouth daily.     cyclobenzaprine (FEXMID) 7.5 MG tablet Take 15 mg by mouth at bedtime.     escitalopram (LEXAPRO) 5 MG tablet Take 5 mg by mouth daily.     ferrous fumarate (FERRETTS) 325 (106 Fe) MG TABS tablet Take 1 tablet by mouth daily.     gabapentin (NEURONTIN) 300 MG capsule Take 300 mg by mouth 2 (two) times daily.     ibuprofen (ADVIL) 400 MG tablet Take 1 tablet (400 mg total) by mouth every 6 (six) hours as needed for mild pain or moderate pain. (Patient not taking: Reported on 09/11/2023)     leptospermum manuka honey (MEDIHONEY) PSTE paste Apply 1 Application topically daily. (Patient not taking: Reported on 09/11/2023)     mirtazapine (REMERON) 7.5 MG tablet Take 7.5 mg by mouth at bedtime.     Multiple Vitamin (MULTIVITAMIN) tablet Take 1 tablet by mouth daily.     oxyCODONE 10 MG TABS Take 1-1.5  tablets (10-15 mg total) by mouth every 4 (four) hours as needed for severe pain or moderate pain. (Patient taking differently: Take 10 mg by mouth every 4 (four) hours.) 30 tablet 0   Polyethyl Glycol-Propyl Glycol (SYSTANE) 0.4-0.3 % SOLN Place 1 drop into both eyes daily.     potassium chloride SA (KLOR-CON M) 20 MEQ tablet Take 2 tablets (40 mEq total) by mouth daily. (Patient not taking: Reported on 09/11/2023)     senna-docusate (SENOKOT-S) 8.6-50 MG tablet Take 1  tablet by mouth daily.     Sofosbuvir-Velpatasvir (EPCLUSA) 400-100 MG TABS Take 1 tablet by mouth daily. 28 tablet 2   tamsulosin (FLOMAX) 0.4 MG CAPS capsule Take 1 capsule (0.4 mg total) by mouth daily after supper. (Patient taking differently: Take 0.4 mg by mouth every evening.) 30 capsule 3   traMADol (ULTRAM) 50 MG tablet Take 1 tablet (50 mg total) by mouth every 6 (six) hours as needed. 20 tablet 0   Current Facility-Administered Medications on File Prior to Visit  Medication Dose Route Frequency Provider Last Rate Last Admin   heparin lock flush 100 UNIT/ML injection             There are no Patient Instructions on file for this visit. No follow-ups on file.   Georgiana Spinner, NP

## 2023-09-15 ENCOUNTER — Encounter
Admission: RE | Admit: 2023-09-15 | Discharge: 2023-09-15 | Disposition: A | Discharge status: Home / Self Care | Payer: Medicaid Other | Source: Ambulatory Visit | Attending: Vascular Surgery | Admitting: Vascular Surgery

## 2023-09-15 VITALS — Ht 69.0 in | Wt 150.0 lb

## 2023-09-15 DIAGNOSIS — Z01812 Encounter for preprocedural laboratory examination: Secondary | ICD-10-CM

## 2023-09-15 NOTE — Patient Instructions (Addendum)
Your procedure is scheduled on: Wednesday, February 19 Report to the Registration Desk on the 1st floor of the [facility] at 12:45 pm  REMEMBER: Instructions that are not followed completely may result in serious medical risk, up to and including death; or upon the discretion of your surgeon and anesthesiologist your surgery may need to be rescheduled.  Do not eat or drink after midnight the night before surgery.  No gum chewing or hard candies.  One week prior to surgery: Stop Anti-inflammatories (NSAIDS) such as Advil, Aleve, Ibuprofen, Motrin, Naproxen, Naprosyn and Aspirin based products such as Excedrin, Goody's Powder, BC Powder. Stop ANY OVER THE COUNTER supplements until after surgery. Stop vitamin C, calcium, multiple vitamins.  You may however, continue to take Tylenol if needed for pain up until the day of surgery.  clopidogrel (PLAVIX) - do NOT take any more until AFTER surgery per surgeon instruction. For future reference, this is a blood thinner and should be stopped days before surgery. Please investigate this for other possible upcoming surgeries.  Continue taking all of your other prescription medications up until the day of surgery.  ON THE DAY OF SURGERY ONLY TAKE THESE MEDICATIONS WITH SIPS OF WATER:  escitalopram (LEXAPRO)  gabapentin (NEURONTIN)  oxyCODONE if needed for pain Sofosbuvir-Velpatasvir  sulfamethoxazole-trimethoprim (BACTRIM DS)   No Alcohol for 24 hours before or after surgery.  No Smoking including e-cigarettes for 24 hours before surgery.  No chewable tobacco products for at least 6 hours before surgery.  No nicotine patches on the day of surgery.  Do not use any "recreational" drugs for at least a week (preferably 2 weeks) before your surgery.  Please be advised that the combination of cocaine and anesthesia may have negative outcomes, up to and including death. If you test positive for cocaine, your surgery will be cancelled.  On the  morning of surgery brush your teeth with toothpaste and water, you may rinse your mouth with mouthwash if you wish. Do not swallow any toothpaste or mouthwash.  Shower using antibacterial soap before coming to the hospital and wear clean clothes.  Do not wear jewelry, make-up, hairpins, clips or nail polish.  For welded (permanent) jewelry: bracelets, anklets, waist bands, etc.  Please have this removed prior to surgery.  If it is not removed, there is a chance that hospital personnel will need to cut it off on the day of surgery.  Do not wear lotions, powders, or perfumes.   Do not shave body hair from the neck down 48 hours before surgery.  Contact lenses, hearing aids and dentures may not be worn into surgery.  Do not bring valuables to the hospital. [facility] is not responsible for any missing/lost belongings or valuables.   Notify your doctor if there is any change in your medical condition (cold, fever, infection).  Wear comfortable clothing (specific to your surgery type) to the hospital.  After surgery, you can help prevent lung complications by doing breathing exercises.  Take deep breaths and cough every 1-2 hours. Your doctor may order a device called an Incentive Spirometer to help you take deep breaths.  If you are being admitted to the hospital overnight, leave your suitcase in the car. After surgery it may be brought to your room.  In case of increased patient census, it may be necessary for you, the patient, to continue your postoperative care in the Same Day Surgery department.  If you are being discharged the day of surgery, you will not be allowed  to drive home. You will need a responsible individual to drive you home and stay with you for 24 hours after surgery.   If you are taking public transportation, you will need to have a responsible individual with you.  Please call the Pre-admissions Testing Dept. at [phone] if you have any questions about these  instructions.  Surgery Visitation Policy:  Patients having surgery or a procedure may have two visitors.  Children under the age of 62 must have an adult with them who is not the patient.  Temporary Visitor Restrictions Due to increasing cases of flu, RSV and COVID-19: Children ages 72 and under will not be able to visit patients in [facility] hospitals under most circumstances.  Inpatient Visitation:    Visiting hours are 7 a.m. to 8 p.m. Up to four visitors are allowed at one time in a patient room. The visitors may rotate out with other people during the day.  One visitor age 11 or older may stay with the patient overnight and must be in the room by 8 p.m.

## 2023-09-15 NOTE — Pre-Procedure Instructions (Signed)
Call to [facility] to complete pre-op interview. Was informed that the patient did not stop taking plavix and took it this morning (09/15/23). Surgery for below knee amputation is tomorrow (09/16/23). Dr. Gilda Crease made aware of this and replied that it was ok and for the patient to NOT take the plavix tomorrow. Marylene Land made aware of stopping the plavix and surgical instructions was faxed to [facility]. Patient to arrive at 12:45 pm tomorrow to complete required labs,EKG before surgery.

## 2023-09-16 ENCOUNTER — Encounter: Payer: Self-pay | Admitting: Urgent Care

## 2023-09-16 ENCOUNTER — Ambulatory Visit
Admission: RE | Admit: 2023-09-16 | Discharge: 2023-09-16 | Disposition: A | Discharge status: Home / Self Care | Payer: Medicaid Other | Attending: Vascular Surgery | Admitting: Vascular Surgery

## 2023-09-16 NOTE — Progress Notes (Signed)
Call received at 0839 from Christin at [facility] stating the patient ate a biscuit and gravy approximately at approximately 0830.  This nurse advised Christin I would need to contact anesthesia and Dr. Gilda Crease to address postponing surgery versus rescheduling. Told Christin I would return her call shortly with a response. Anestheisa stated patient could not proceed to surgery until after 1630.  Dr. Gilda Crease made aware and stated patient would need to be rescheduled for another day.   Called Christin and left voicemail on personal voicemail at [phone] stating patient was cancelled and will need to be rescheduled with a callback number.   Patient arrived to [facility] waiting area at approximately 1250 accompanied by hospital volunteer in private wheelchair. Spoke to patient in [facility] waiting area at 1300 and explained the reasoning for rescheduling. Patient expressed understanding.  Informed the patient, I would call the facility to arrange transportation back.    Called [facility] at 1314 and spoke to Christin. She stated she would contact the transportation coordinator to arrange his transportation back. Patient transported by hospital volunteer back to first floor to await transportation.   Made aware at approximately 1430 patient was still awaiting transportation.  Called [facility] medical transportation directly and left voicemail on return transport line at [phone] with name and callback number.  No return call was received.    Received call from Christin at [facility] at 1451 with questions about rescheduling procedure for patient. I provided information and asked again about transportation arrangements.  Christin stated they should be on the way.   Call received at approximately 1530 stating [facility] transportation is closed for the day. Informed we would need to contact EMS non-emergent transport.  EMS contacted at 1540 and stated they were no longer taking non-emergent transports at this time  for today.

## 2023-09-17 ENCOUNTER — Telehealth (INDEPENDENT_AMBULATORY_CARE_PROVIDER_SITE_OTHER): Payer: Self-pay

## 2023-09-17 NOTE — H&P (Signed)
The patient presented for surgery but had eaten a full breakfast and was returned to the facility without undergoing any procedures

## 2023-09-17 NOTE — Pre-Procedure Instructions (Signed)
New surgical instructions faxed to [facility] with new surgical date 09/23/23. Confirmation fax obtained. Call to [facility] and spoke to nurse [facility] to confirm receipt.

## 2023-09-17 NOTE — Telephone Encounter (Signed)
Patient has been rescheduled from 020/19/25 to 09/23/23 for his right BKA with Dr. Gilda Crease.

## 2023-09-17 NOTE — Pre-Procedure Instructions (Signed)
Your surgery (RIGHT BELOW THE KNEE AMPUTATION) is rescheduled for: Wednesday, February 26 Report to the Registration Desk on the 1st floor of the [facility] at 11 am. If you need to contact someone in same day surgery, please call [phone]. REMEMBER: Instructions that are not followed completely may result in serious medical risk, up to and including death; or upon the discretion of your surgeon and anesthesiologist your surgery may need to be rescheduled.  Do not eat OR drink after midnight the night before surgery.  No gum chewing or hard candies.  One week prior to surgery: starting today, February 20 Stop Anti-inflammatories (NSAIDS) such as Advil, Aleve, Ibuprofen, Motrin, Naproxen, Naprosyn and Aspirin based products such as Excedrin, Goody's Powder, BC Powder. Stop ANY OVER THE COUNTER supplements until after surgery. Stop vitamin C, calcium, multiple vitamins.  You may however, continue to take Tylenol if needed for pain up until the day of surgery.  Clopidogrel (Plavix) - hold for 5 days before surgery. Last day to TAKE is Thursday, February 20. Resume AFTER surgery per surgeon instruction.  Continue taking all of your other prescription medications up until the day of surgery.  ON THE DAY OF SURGERY ONLY TAKE THESE MEDICATIONS WITH SIPS OF WATER:  Escitalopram (Lexapro) Gabapentin Sofosbuvir-Velpatasvir Sulfamethoxazole-trimethoprim (Bactrim) if still taking Oxycodone only if needed for pain  No Alcohol for 24 hours before or after surgery.  No Smoking including e-cigarettes for 24 hours before surgery.  No chewable tobacco products for at least 6 hours before surgery.  No nicotine patches on the day of surgery.  Do not use any "recreational" drugs for at least a week (preferably 2 weeks) before your surgery.  Please be advised that the combination of cocaine and anesthesia may have negative outcomes, up to and including death. If you test positive for cocaine,  your surgery will be cancelled.  On the morning of surgery brush your teeth with toothpaste and water, you may rinse your mouth with mouthwash if you wish. Do not swallow any toothpaste or mouthwash.  Shower using antibacterial soap before coming to the hospital and wear clean clothes.  Do not wear jewelry, make-up, hairpins, clips or nail polish.  For welded (permanent) jewelry: bracelets, anklets, waist bands, etc.  Please have this removed prior to surgery.  If it is not removed, there is a chance that hospital personnel will need to cut it off on the day of surgery.  Do not wear lotions, powders, or perfumes.   Do not shave body hair from the neck down 48 hours before surgery.  Contact lenses, hearing aids and dentures may not be worn into surgery.  Do not bring valuables to the hospital. [facility] is not responsible for any missing/lost belongings or valuables.   Notify your doctor if there is any change in your medical condition (cold, fever, infection).  Wear comfortable clothing (specific to your surgery type) to the hospital.  After surgery, you can help prevent lung complications by doing breathing exercises.  Take deep breaths and cough every 1-2 hours. Your doctor may order a device called an Incentive Spirometer to help you take deep breaths.  If you are being discharged the day of surgery, you will not be allowed to drive home. You will need a responsible individual to drive you home and stay with you for 24 hours after surgery.   If you are taking public transportation, you will need to have a responsible individual with you.  Please call the Pre-admissions  Testing Dept. at [phone] if you have any questions about these instructions.  Surgery Visitation Policy:  Patients having surgery or a procedure may have two visitors.  Children under the age of 53 must have an adult with them who is not the patient.  Temporary Visitor Restrictions Due to increasing  cases of flu, RSV and COVID-19: Children ages 60 and under will not be able to visit patients in [facility] hospitals under most circumstances.  Inpatient Visitation:    Visiting hours are 7 a.m. to 8 p.m. Up to four visitors are allowed at one time in a patient room. The visitors may rotate out with other people during the day.  One visitor age 59 or older may stay with the patient overnight and must be in the room by 8 p.m.

## 2023-09-23 ENCOUNTER — Encounter: Payer: Self-pay | Admitting: Urgent Care

## 2023-09-23 ENCOUNTER — Ambulatory Visit: Admission: RE | Admit: 2023-09-23 | Payer: Medicaid Other | Source: Home / Self Care | Admitting: Vascular Surgery

## 2023-09-23 ENCOUNTER — Encounter: Admission: RE | Payer: Self-pay | Source: Home / Self Care

## 2023-09-23 SURGERY — AMPUTATION BELOW KNEE
Anesthesia: General | Site: Knee | Laterality: Right

## 2023-09-23 NOTE — Pre-Procedure Instructions (Signed)
 New surgery instructions faxed to [facility]. Call to [facility] ([facility] transportation coordinator) and message left for Marylene Land (nurse) regarding new date, time of arrival and surgery instructions. Quentin Mulling, NP also emailed the instructions to Crystal (assistant DON at [facility])

## 2023-09-23 NOTE — Pre-Procedure Instructions (Signed)
 Surgery "right below knee amputation" was cancelled today due to surgeon being sick. Surgery rescheduled for Wednesday, March 5. New instructions faxed to [facility] as follows:  Your procedure is scheduled on: Wednesday, March 5 Report to the Registration Desk on the 1st floor of the [facility] at 11:00 am.  REMEMBER: Instructions that are not followed completely may result in serious medical risk, up to and including death; or upon the discretion of your surgeon and anesthesiologist your surgery may need to be rescheduled.  Do not eat or drink after midnight the night before surgery.  No gum chewing or hard candies.  One week prior to surgery: starting February 26 Stop Anti-inflammatories (NSAIDS) such as Advil, Aleve, Ibuprofen, Motrin, Naproxen, Naprosyn and Aspirin based products such as Excedrin, Goody's Powder, BC Powder. Stop ANY OVER THE COUNTER supplements until after surgery. Stop vitamin C, calcium, multiple vitamins.  You may however, continue to take Tylenol if needed for pain up until the day of surgery.  Clopidogrel (Plavix) - hold for 5 days before surgery. Last day to take plavix is Thursday, February 27. Resume AFTER surgery per surgeon instruction.  Continue taking all of your other prescription medications up until the day of surgery.  ON THE DAY OF SURGERY ONLY TAKE THESE MEDICATIONS WITH SIPS OF WATER:  Escitalopram (Lexapro) Gabapentin Sofosbuvir-Velpatasvir Sulfamethoxazole-trimethoprim (Bactrim) - if still taking Oxycodone only if needed for pain  No Alcohol for 24 hours before or after surgery.  No Smoking including e-cigarettes for 24 hours before surgery.  No chewable tobacco products for at least 6 hours before surgery.  No nicotine patches on the day of surgery.  Do not use any "recreational" drugs for at least a week (preferably 2 weeks) before your surgery.  Please be advised that the combination of cocaine and anesthesia may have  negative outcomes, up to and including death. If you test positive for cocaine, your surgery will be cancelled.  On the morning of surgery brush your teeth with toothpaste and water, you may rinse your mouth with mouthwash if you wish. Do not swallow any toothpaste or mouthwash.  Shower using antibacterial soap before coming to the hospital and wear clean clothes.  Do not wear jewelry, make-up, hairpins, clips or nail polish.  For welded (permanent) jewelry: bracelets, anklets, waist bands, etc.  Please have this removed prior to surgery.  If it is not removed, there is a chance that hospital personnel will need to cut it off on the day of surgery.  Do not wear lotions, powders, or perfumes.   Do not shave body hair from the neck down 48 hours before surgery.  Contact lenses, hearing aids and dentures may not be worn into surgery.  Do not bring valuables to the hospital. [facility] is not responsible for any missing/lost belongings or valuables.   Notify your doctor if there is any change in your medical condition (cold, fever, infection).  Wear comfortable clothing (specific to your surgery type) to the hospital.  After surgery, you can help prevent lung complications by doing breathing exercises.  Take deep breaths and cough every 1-2 hours. Your doctor may order a device called an Incentive Spirometer to help you take deep breaths.  If you are being discharged the day of surgery, you will not be allowed to drive home. You will need a responsible individual to drive you home and stay with you for 24 hours after surgery.   If you are taking public transportation, you will need  to have a responsible individual with you.  Please call the Pre-admissions Testing Dept. at [phone] if you have any questions about these instructions.  Surgery Visitation Policy:  Patients having surgery or a procedure may have two visitors.  Children under the age of 65 must have an adult with  them who is not the patient.  Temporary Visitor Restrictions Due to increasing cases of flu, RSV and COVID-19: Children ages 70 and under will not be able to visit patients in [facility] hospitals under most circumstances.  Inpatient Visitation:    Visiting hours are 7 a.m. to 8 p.m. Up to four visitors are allowed at one time in a patient room. The visitors may rotate out with other people during the day.  One visitor age 21 or older may stay with the patient overnight and must be in the room by 8 p.m.

## 2023-09-24 NOTE — Progress Notes (Signed)
 Spoke with Chad [name] medical transportation on 09/24/23 at 1600.  Transportation for surgery arrival scheduled at 11am and tentative departure 4pm or after.

## 2023-09-26 ENCOUNTER — Encounter: Payer: Self-pay | Admitting: Internal Medicine

## 2023-09-29 ENCOUNTER — Other Ambulatory Visit

## 2023-09-29 ENCOUNTER — Ambulatory Visit: Payer: Medicaid Other | Admitting: Physician Assistant

## 2023-09-30 ENCOUNTER — Encounter
Admission: RE | Disposition: A | Discharge status: Skilled Nursing Facility | Payer: Self-pay | Source: Skilled Nursing Facility | Attending: Vascular Surgery

## 2023-09-30 ENCOUNTER — Other Ambulatory Visit: Payer: Self-pay

## 2023-09-30 ENCOUNTER — Inpatient Hospital Stay: Payer: Self-pay | Admitting: Urgent Care

## 2023-09-30 ENCOUNTER — Inpatient Hospital Stay

## 2023-09-30 ENCOUNTER — Inpatient Hospital Stay
Admission: RE | Admit: 2023-09-30 | Discharge: 2023-10-06 | DRG: 240 | Disposition: A | Discharge status: Skilled Nursing Facility | Payer: Medicaid Other | Source: Skilled Nursing Facility | Attending: Surgery | Admitting: Surgery

## 2023-09-30 ENCOUNTER — Encounter: Payer: Self-pay | Admitting: Vascular Surgery

## 2023-09-30 DIAGNOSIS — K219 Gastro-esophageal reflux disease without esophagitis: Secondary | ICD-10-CM | POA: Diagnosis present

## 2023-09-30 DIAGNOSIS — Z8673 Personal history of transient ischemic attack (TIA), and cerebral infarction without residual deficits: Secondary | ICD-10-CM

## 2023-09-30 DIAGNOSIS — F331 Major depressive disorder, recurrent, moderate: Secondary | ICD-10-CM | POA: Diagnosis present

## 2023-09-30 DIAGNOSIS — D6489 Other specified anemias: Secondary | ICD-10-CM | POA: Diagnosis not present

## 2023-09-30 DIAGNOSIS — Z7982 Long term (current) use of aspirin: Secondary | ICD-10-CM

## 2023-09-30 DIAGNOSIS — Z6822 Body mass index (BMI) 22.0-22.9, adult: Secondary | ICD-10-CM

## 2023-09-30 DIAGNOSIS — I70203 Unspecified atherosclerosis of native arteries of extremities, bilateral legs: Secondary | ICD-10-CM | POA: Diagnosis present

## 2023-09-30 DIAGNOSIS — S81801A Unspecified open wound, right lower leg, initial encounter: Secondary | ICD-10-CM

## 2023-09-30 DIAGNOSIS — I70299 Other atherosclerosis of native arteries of extremities, unspecified extremity: Principal | ICD-10-CM | POA: Diagnosis present

## 2023-09-30 DIAGNOSIS — Z8554 Personal history of malignant neoplasm of ureter: Secondary | ICD-10-CM | POA: Diagnosis not present

## 2023-09-30 DIAGNOSIS — Z8546 Personal history of malignant neoplasm of prostate: Secondary | ICD-10-CM | POA: Diagnosis not present

## 2023-09-30 DIAGNOSIS — Z01812 Encounter for preprocedural laboratory examination: Secondary | ICD-10-CM

## 2023-09-30 DIAGNOSIS — Z7902 Long term (current) use of antithrombotics/antiplatelets: Secondary | ICD-10-CM

## 2023-09-30 DIAGNOSIS — Z8559 Personal history of malignant neoplasm of other urinary tract organ: Secondary | ICD-10-CM | POA: Diagnosis not present

## 2023-09-30 DIAGNOSIS — Z8551 Personal history of malignant neoplasm of bladder: Secondary | ICD-10-CM

## 2023-09-30 DIAGNOSIS — E46 Unspecified protein-calorie malnutrition: Secondary | ICD-10-CM | POA: Diagnosis present

## 2023-09-30 DIAGNOSIS — B182 Chronic viral hepatitis C: Secondary | ICD-10-CM | POA: Diagnosis present

## 2023-09-30 DIAGNOSIS — N401 Enlarged prostate with lower urinary tract symptoms: Secondary | ICD-10-CM | POA: Diagnosis present

## 2023-09-30 DIAGNOSIS — L97919 Non-pressure chronic ulcer of unspecified part of right lower leg with unspecified severity: Secondary | ICD-10-CM

## 2023-09-30 DIAGNOSIS — Z89512 Acquired absence of left leg below knee: Secondary | ICD-10-CM

## 2023-09-30 DIAGNOSIS — E876 Hypokalemia: Secondary | ICD-10-CM | POA: Diagnosis not present

## 2023-09-30 DIAGNOSIS — J449 Chronic obstructive pulmonary disease, unspecified: Secondary | ICD-10-CM | POA: Diagnosis present

## 2023-09-30 DIAGNOSIS — E538 Deficiency of other specified B group vitamins: Secondary | ICD-10-CM | POA: Diagnosis present

## 2023-09-30 DIAGNOSIS — Z1509 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: Secondary | ICD-10-CM

## 2023-09-30 DIAGNOSIS — I96 Gangrene, not elsewhere classified: Secondary | ICD-10-CM | POA: Diagnosis not present

## 2023-09-30 DIAGNOSIS — L97419 Non-pressure chronic ulcer of right heel and midfoot with unspecified severity: Secondary | ICD-10-CM | POA: Diagnosis present

## 2023-09-30 DIAGNOSIS — I70261 Atherosclerosis of native arteries of extremities with gangrene, right leg: Secondary | ICD-10-CM | POA: Diagnosis present

## 2023-09-30 DIAGNOSIS — Z8042 Family history of malignant neoplasm of prostate: Secondary | ICD-10-CM

## 2023-09-30 DIAGNOSIS — M24561 Contracture, right knee: Secondary | ICD-10-CM | POA: Diagnosis present

## 2023-09-30 DIAGNOSIS — Z85038 Personal history of other malignant neoplasm of large intestine: Secondary | ICD-10-CM

## 2023-09-30 DIAGNOSIS — R5082 Postprocedural fever: Secondary | ICD-10-CM | POA: Diagnosis not present

## 2023-09-30 DIAGNOSIS — M24562 Contracture, left knee: Secondary | ICD-10-CM | POA: Diagnosis present

## 2023-09-30 DIAGNOSIS — E785 Hyperlipidemia, unspecified: Secondary | ICD-10-CM | POA: Diagnosis present

## 2023-09-30 DIAGNOSIS — L97909 Non-pressure chronic ulcer of unspecified part of unspecified lower leg with unspecified severity: Secondary | ICD-10-CM | POA: Diagnosis not present

## 2023-09-30 DIAGNOSIS — F419 Anxiety disorder, unspecified: Secondary | ICD-10-CM | POA: Diagnosis present

## 2023-09-30 DIAGNOSIS — F17210 Nicotine dependence, cigarettes, uncomplicated: Secondary | ICD-10-CM | POA: Diagnosis present

## 2023-09-30 DIAGNOSIS — Z880 Allergy status to penicillin: Secondary | ICD-10-CM | POA: Diagnosis not present

## 2023-09-30 DIAGNOSIS — Z9049 Acquired absence of other specified parts of digestive tract: Secondary | ICD-10-CM

## 2023-09-30 DIAGNOSIS — Z79899 Other long term (current) drug therapy: Secondary | ICD-10-CM

## 2023-09-30 LAB — COMPREHENSIVE METABOLIC PANEL
ALT: 17 U/L (ref 0–44)
AST: 21 U/L (ref 15–41)
Albumin: 2.5 g/dL — ABNORMAL LOW (ref 3.5–5.0)
Alkaline Phosphatase: 102 U/L (ref 38–126)
Anion gap: 7 (ref 5–15)
BUN: 24 mg/dL — ABNORMAL HIGH (ref 8–23)
CO2: 22 mmol/L (ref 22–32)
Calcium: 8.5 mg/dL — ABNORMAL LOW (ref 8.9–10.3)
Chloride: 108 mmol/L (ref 98–111)
Creatinine, Ser: 0.93 mg/dL (ref 0.61–1.24)
GFR, Estimated: >60 mL/min (ref >60)
Glucose, Bld: 79 mg/dL (ref 70–99)
Potassium: 4.1 mmol/L (ref 3.5–5.1)
Sodium: 137 mmol/L (ref 135–145)
Total Bilirubin: 0.5 mg/dL (ref 0.0–1.2)
Total Protein: 6.9 g/dL (ref 6.5–8.1)

## 2023-09-30 LAB — CBC
HCT: 27 % — ABNORMAL LOW (ref 39.0–52.0)
Hemoglobin: 8.2 g/dL — ABNORMAL LOW (ref 13.0–17.0)
MCH: 25.2 pg — ABNORMAL LOW (ref 26.0–34.0)
MCHC: 30.4 g/dL (ref 30.0–36.0)
MCV: 82.8 fL (ref 80.0–100.0)
Platelets: 289 10*3/uL (ref 150–400)
RBC: 3.26 MIL/uL — ABNORMAL LOW (ref 4.22–5.81)
RDW: 17.1 % — ABNORMAL HIGH (ref 11.5–15.5)
WBC: 7.2 10*3/uL (ref 4.0–10.5)
nRBC: 0 % (ref 0.0–0.2)

## 2023-09-30 LAB — SURGICAL PCR SCREEN
MRSA, PCR: POSITIVE — AB
Staphylococcus aureus: POSITIVE — AB

## 2023-09-30 SURGERY — AMPUTATION BELOW KNEE
Anesthesia: General | Site: Knee | Laterality: Right

## 2023-09-30 MED ORDER — MIDAZOLAM HCL 2 MG/2ML IJ SOLN
INTRAMUSCULAR | Status: AC
  Filled 2023-09-30: qty 2

## 2023-09-30 MED ORDER — FENTANYL CITRATE (PF) 100 MCG/2ML IJ SOLN
INTRAMUSCULAR | Status: AC
  Filled 2023-09-30: qty 2

## 2023-09-30 MED ORDER — MAGNESIUM CITRATE PO SOLN: 1.0000 | Freq: Once | ORAL | Status: DC | PRN

## 2023-09-30 MED ORDER — CHLORHEXIDINE GLUCONATE CLOTH 2 % EX PADS
6.0000 | MEDICATED_PAD | Freq: Once | CUTANEOUS | Status: AC
  Administered 2023-09-30: 6 via TOPICAL

## 2023-09-30 MED ORDER — BUPIVACAINE LIPOSOME 1.3 % IJ SUSP
INTRAMUSCULAR | Status: AC
  Filled 2023-09-30: qty 20

## 2023-09-30 MED ORDER — OXYCODONE HCL 5 MG/5ML PO SOLN: 5.0000 mg | Freq: Once | ORAL | Status: DC | PRN

## 2023-09-30 MED ORDER — OXYCODONE HCL 5 MG PO TABS: 5.0000 mg | ORAL_TABLET | Freq: Once | ORAL | Status: DC | PRN

## 2023-09-30 MED ORDER — ONDANSETRON HCL 4 MG/2ML IJ SOLN: 4.0000 mg | Freq: Four times a day (QID) | INTRAMUSCULAR | Status: DC | PRN

## 2023-09-30 MED ORDER — DOCUSATE SODIUM 100 MG PO CAPS
100.0000 mg | ORAL_CAPSULE | Freq: Two times a day (BID) | ORAL | Status: DC
  Administered 2023-09-30 – 2023-10-02 (×4): 100 mg via ORAL
  Filled 2023-09-30 (×4): qty 1

## 2023-09-30 MED ORDER — ACETAMINOPHEN 10 MG/ML IV SOLN: 1000.0000 mg | Freq: Once | INTRAVENOUS | Status: DC | PRN

## 2023-09-30 MED ORDER — DROPERIDOL 2.5 MG/ML IJ SOLN: 0.6250 mg | Freq: Once | INTRAMUSCULAR | Status: DC | PRN

## 2023-09-30 MED ORDER — ENOXAPARIN SODIUM 40 MG/0.4ML IJ SOSY
40.0000 mg | PREFILLED_SYRINGE | INTRAMUSCULAR | Status: DC
  Administered 2023-10-01 – 2023-10-06 (×6): 40 mg via SUBCUTANEOUS
  Filled 2023-09-30 (×6): qty 0.4

## 2023-09-30 MED ORDER — CYCLOBENZAPRINE HCL 5 MG PO TABS: 15.0000 mg | ORAL_TABLET | Freq: Three times a day (TID) | ORAL | Status: DC | PRN

## 2023-09-30 MED ORDER — ONDANSETRON HCL 4 MG PO TABS: 4.0000 mg | ORAL_TABLET | Freq: Four times a day (QID) | ORAL | Status: DC | PRN

## 2023-09-30 MED ORDER — KETAMINE HCL 50 MG/5ML IJ SOSY
PREFILLED_SYRINGE | INTRAMUSCULAR | Status: DC | PRN
  Administered 2023-09-30: 10 mg via INTRAVENOUS
  Administered 2023-09-30: 20 mg via INTRAVENOUS

## 2023-09-30 MED ORDER — DEXMEDETOMIDINE HCL IN NACL 80 MCG/20ML IV SOLN
INTRAVENOUS | Status: DC | PRN
  Administered 2023-09-30 (×3): 4 ug via INTRAVENOUS

## 2023-09-30 MED ORDER — DEXAMETHASONE SODIUM PHOSPHATE 10 MG/ML IJ SOLN
INTRAMUSCULAR | Status: AC
  Filled 2023-09-30: qty 1

## 2023-09-30 MED ORDER — SORBITOL 70 % SOLN: 30.0000 mL | Freq: Every day | Status: DC | PRN

## 2023-09-30 MED ORDER — KETAMINE HCL 50 MG/5ML IJ SOSY
PREFILLED_SYRINGE | INTRAMUSCULAR | Status: AC
  Filled 2023-09-30: qty 5

## 2023-09-30 MED ORDER — OXYCODONE HCL 5 MG PO TABS
10.0000 mg | ORAL_TABLET | ORAL | Status: DC | PRN
  Administered 2023-09-30 – 2023-10-06 (×17): 10 mg via ORAL
  Filled 2023-09-30 (×19): qty 2

## 2023-09-30 MED ORDER — BUPIVACAINE HCL (PF) 0.5 % IJ SOLN
INTRAMUSCULAR | Status: AC
Start: 2023-09-30 — End: ?
  Filled 2023-09-30: qty 30

## 2023-09-30 MED ORDER — CHLORHEXIDINE GLUCONATE 0.12 % MT SOLN
15.0000 mL | Freq: Once | OROMUCOSAL | Status: AC
  Administered 2023-09-30: 15 mL via OROMUCOSAL

## 2023-09-30 MED ORDER — CHLORHEXIDINE GLUCONATE CLOTH 2 % EX PADS: 6.0000 | MEDICATED_PAD | Freq: Once | CUTANEOUS | Status: DC

## 2023-09-30 MED ORDER — HYDROMORPHONE HCL 1 MG/ML IJ SOLN
0.5000 mg | INTRAMUSCULAR | Status: DC | PRN
  Administered 2023-10-01 (×3): 1 mg via INTRAVENOUS
  Filled 2023-09-30 (×3): qty 1

## 2023-09-30 MED ORDER — VASHE WOUND IRRIGATION OPTIME
TOPICAL | Status: DC | PRN
Start: 2023-09-30 — End: 2023-09-30
  Administered 2023-09-30: 34 [oz_av]

## 2023-09-30 MED ORDER — ORAL CARE MOUTH RINSE: 15.0000 mL | Freq: Once | OROMUCOSAL | Status: AC

## 2023-09-30 MED ORDER — PROPOFOL 500 MG/50ML IV EMUL
INTRAVENOUS | Status: DC | PRN
  Administered 2023-09-30: 30 mg via INTRAVENOUS
  Administered 2023-09-30: 10 mg via INTRAVENOUS
  Administered 2023-09-30: 20 mg via INTRAVENOUS
  Administered 2023-09-30: 40 ug/kg/min via INTRAVENOUS
  Administered 2023-09-30: 20 mg via INTRAVENOUS

## 2023-09-30 MED ORDER — PHENYLEPHRINE 80 MCG/ML (10ML) SYRINGE FOR IV PUSH (FOR BLOOD PRESSURE SUPPORT)
PREFILLED_SYRINGE | INTRAVENOUS | Status: DC | PRN
  Administered 2023-09-30: 80 ug via INTRAVENOUS
  Administered 2023-09-30: 160 ug via INTRAVENOUS
  Administered 2023-09-30: 80 ug via INTRAVENOUS
  Administered 2023-09-30: 160 ug via INTRAVENOUS
  Administered 2023-09-30: 80 ug via INTRAVENOUS
  Administered 2023-09-30: 160 ug via INTRAVENOUS

## 2023-09-30 MED ORDER — FERROUS FUMARATE 324 (106 FE) MG PO TABS
106.0000 mg | ORAL_TABLET | Freq: Every day | ORAL | Status: DC
Start: 2023-10-01 — End: 2023-10-06
  Administered 2023-10-01 – 2023-10-06 (×6): 106 mg via ORAL
  Filled 2023-09-30 (×6): qty 1

## 2023-09-30 MED ORDER — STERILE WATER FOR IRRIGATION IR SOLN
Status: DC | PRN
  Administered 2023-09-30: 500 mL

## 2023-09-30 MED ORDER — TAMSULOSIN HCL 0.4 MG PO CAPS
0.4000 mg | ORAL_CAPSULE | Freq: Every day | ORAL | Status: DC
  Administered 2023-09-30 – 2023-10-05 (×6): 0.4 mg via ORAL
  Filled 2023-09-30 (×6): qty 1

## 2023-09-30 MED ORDER — ONDANSETRON HCL 4 MG/2ML IJ SOLN
INTRAMUSCULAR | Status: AC
  Filled 2023-09-30: qty 2

## 2023-09-30 MED ORDER — HYDROMORPHONE HCL 1 MG/ML IJ SOLN: 1.0000 mg | Freq: Once | INTRAMUSCULAR | Status: DC | PRN

## 2023-09-30 MED ORDER — ATORVASTATIN CALCIUM 20 MG PO TABS
10.0000 mg | ORAL_TABLET | Freq: Every day | ORAL | Status: DC
  Administered 2023-09-30 – 2023-10-06 (×7): 10 mg via ORAL
  Filled 2023-09-30 (×7): qty 1

## 2023-09-30 MED ORDER — ACETAMINOPHEN 325 MG PO TABS
650.0000 mg | ORAL_TABLET | Freq: Four times a day (QID) | ORAL | Status: DC | PRN
  Administered 2023-10-01 (×2): 650 mg via ORAL
  Filled 2023-09-30 (×2): qty 2

## 2023-09-30 MED ORDER — VANCOMYCIN HCL IN DEXTROSE 1-5 GM/200ML-% IV SOLN
INTRAVENOUS | Status: AC
  Filled 2023-09-30: qty 200

## 2023-09-30 MED ORDER — MIDAZOLAM HCL 2 MG/2ML IJ SOLN
INTRAMUSCULAR | Status: DC | PRN
  Administered 2023-09-30: 2 mg via INTRAVENOUS

## 2023-09-30 MED ORDER — ESCITALOPRAM OXALATE 10 MG PO TABS
5.0000 mg | ORAL_TABLET | Freq: Every day | ORAL | Status: DC
  Administered 2023-09-30 – 2023-10-06 (×7): 5 mg via ORAL
  Filled 2023-09-30 (×7): qty 1

## 2023-09-30 MED ORDER — CHLORHEXIDINE GLUCONATE 0.12 % MT SOLN
OROMUCOSAL | Status: AC
  Filled 2023-09-30: qty 15

## 2023-09-30 MED ORDER — ACETAMINOPHEN 650 MG RE SUPP: 650.0000 mg | Freq: Four times a day (QID) | RECTAL | Status: DC | PRN

## 2023-09-30 MED ORDER — GABAPENTIN 300 MG PO CAPS
300.0000 mg | ORAL_CAPSULE | Freq: Two times a day (BID) | ORAL | Status: DC
  Administered 2023-09-30 – 2023-10-06 (×12): 300 mg via ORAL
  Filled 2023-09-30 (×12): qty 1

## 2023-09-30 MED ORDER — ASPIRIN 81 MG PO TBEC
81.0000 mg | DELAYED_RELEASE_TABLET | Freq: Every day | ORAL | Status: DC
  Administered 2023-10-01: 81 mg via ORAL
  Filled 2023-09-30: qty 1

## 2023-09-30 MED ORDER — ONDANSETRON HCL 4 MG/2ML IJ SOLN
INTRAMUSCULAR | Status: DC | PRN
  Administered 2023-09-30: 4 mg via INTRAVENOUS

## 2023-09-30 MED ORDER — VANCOMYCIN HCL IN DEXTROSE 1-5 GM/200ML-% IV SOLN
1000.0000 mg | INTRAVENOUS | Status: AC
  Administered 2023-09-30: 1000 mg via INTRAVENOUS

## 2023-09-30 MED ORDER — BUPIVACAINE HCL (PF) 0.5 % IJ SOLN
INTRAMUSCULAR | Status: DC | PRN
  Administered 2023-09-30 (×2): 10 mL via PERINEURAL

## 2023-09-30 MED ORDER — BUPIVACAINE HCL (PF) 0.5 % IJ SOLN
INTRAMUSCULAR | Status: AC
  Filled 2023-09-30: qty 40

## 2023-09-30 MED ORDER — ONDANSETRON HCL 4 MG/2ML IJ SOLN
4.0000 mg | Freq: Four times a day (QID) | INTRAMUSCULAR | Status: DC | PRN
  Filled 2023-09-30: qty 2

## 2023-09-30 MED ORDER — 0.9 % SODIUM CHLORIDE (POUR BTL) OPTIME
TOPICAL | Status: DC | PRN
  Administered 2023-09-30: 1000 mL

## 2023-09-30 MED ORDER — MIRTAZAPINE 7.5 MG PO TABS
7.5000 mg | ORAL_TABLET | Freq: Every day | ORAL | Status: DC
  Administered 2023-09-30 – 2023-10-05 (×6): 7.5 mg via ORAL
  Filled 2023-09-30 (×6): qty 1

## 2023-09-30 MED ORDER — LACTATED RINGERS IV SOLN: INTRAVENOUS | Status: DC

## 2023-09-30 MED ORDER — CLOPIDOGREL BISULFATE 75 MG PO TABS
75.0000 mg | ORAL_TABLET | Freq: Every day | ORAL | Status: DC
  Administered 2023-09-30 – 2023-10-06 (×7): 75 mg via ORAL
  Filled 2023-09-30 (×7): qty 1

## 2023-09-30 MED ORDER — BUPIVACAINE LIPOSOME 1.3 % IJ SUSP
INTRAMUSCULAR | Status: DC | PRN
  Administered 2023-09-30 (×2): 10 mL via PERINEURAL

## 2023-09-30 MED ORDER — FENTANYL CITRATE (PF) 100 MCG/2ML IJ SOLN
25.0000 ug | INTRAMUSCULAR | Status: DC | PRN
  Administered 2023-09-30 (×4): 25 ug via INTRAVENOUS

## 2023-09-30 MED ORDER — SENNOSIDES-DOCUSATE SODIUM 8.6-50 MG PO TABS
1.0000 | ORAL_TABLET | Freq: Every day | ORAL | Status: DC
  Administered 2023-09-30 – 2023-10-02 (×3): 1 via ORAL
  Filled 2023-09-30 (×3): qty 1

## 2023-09-30 SURGICAL SUPPLY — 43 items
BLADE SAGITTAL WIDE XTHICK NO (BLADE) ×1 IMPLANT
BLADE SURG SZ10 CARB STEEL (BLADE) ×1 IMPLANT
BNDG COHESIVE 4X5 TAN STRL LF (GAUZE/BANDAGES/DRESSINGS) ×1 IMPLANT
BNDG ELASTIC 4X5.8 VLCR NS LF (GAUZE/BANDAGES/DRESSINGS) ×1 IMPLANT
BNDG GAUZE DERMACEA FLUFF 4 (GAUZE/BANDAGES/DRESSINGS) ×1 IMPLANT
BRUSH SCRUB EZ 4% CHG (MISCELLANEOUS) ×1 IMPLANT
CLEANSER WND VASHE 34 (WOUND CARE) IMPLANT
DRAPE INCISE IOBAN 66X45 STRL (DRAPES) ×1 IMPLANT
DRSG GAUZE FLUFF 36X18 (GAUZE/BANDAGES/DRESSINGS) ×1 IMPLANT
ELECT CAUTERY BLADE 6.4 (BLADE) ×1 IMPLANT
ELECT REM PT RETURN 9FT ADLT (ELECTROSURGICAL) ×1 IMPLANT
ELECTRODE REM PT RTRN 9FT ADLT (ELECTROSURGICAL) ×1 IMPLANT
GAUZE SPONGE 4X4 12PLY STRL (GAUZE/BANDAGES/DRESSINGS) IMPLANT
GAUZE XEROFORM 1X8 LF (GAUZE/BANDAGES/DRESSINGS) IMPLANT
GLOVE BIO SURGEON STRL SZ7 (GLOVE) ×2 IMPLANT
GLOVE INDICATOR 7.5 STRL GRN (GLOVE) ×1 IMPLANT
GLOVE SURG SYN 8.0 (GLOVE) ×1 IMPLANT
GLOVE SURG SYN 8.0 PF PI (GLOVE) ×1 IMPLANT
GOWN STRL REUS W/ TWL LRG LVL3 (GOWN DISPOSABLE) ×2 IMPLANT
GOWN STRL REUS W/ TWL XL LVL3 (GOWN DISPOSABLE) ×1 IMPLANT
HANDLE YANKAUER SUCT BULB TIP (MISCELLANEOUS) ×1 IMPLANT
KIT TURNOVER KIT A (KITS) ×1 IMPLANT
LABEL OR SOLS (LABEL) ×1 IMPLANT
MANIFOLD NEPTUNE II (INSTRUMENTS) ×1 IMPLANT
MAT ABSORB FLUID 56X50 GRAY (MISCELLANEOUS) ×1 IMPLANT
NS IRRIG 500ML POUR BTL (IV SOLUTION) ×1 IMPLANT
PACK EXTREMITY ARMC (MISCELLANEOUS) ×1 IMPLANT
PAD ABD DERMACEA PRESS 5X9 (GAUZE/BANDAGES/DRESSINGS) ×1 IMPLANT
PAD PREP OB/GYN DISP 24X41 (PERSONAL CARE ITEMS) ×1 IMPLANT
SOL PREP PVP 2OZ (MISCELLANEOUS) ×2 IMPLANT
SOLUTION PREP PVP 2OZ (MISCELLANEOUS) IMPLANT
SPONGE T-LAP 18X18 ~~LOC~~+RFID (SPONGE) ×2 IMPLANT
STAPLER SKIN PROX 35W (STAPLE) IMPLANT
STOCKINETTE M/LG 89821 (MISCELLANEOUS) ×1 IMPLANT
SUT SILK 3-0 18XBRD TIE 12 (SUTURE) ×1 IMPLANT
SUT VIC AB 0 CT1 36 (SUTURE) ×4 IMPLANT
SUT VIC AB 3-0 SH 27X BRD (SUTURE) ×2 IMPLANT
SUT VICRYL PLUS ABS 0 54 (SUTURE) ×1 IMPLANT
SYR 20ML LL LF (SYRINGE) ×2 IMPLANT
TAPE UMBILICAL 1/8 X36 TWILL (MISCELLANEOUS) ×1 IMPLANT
TOWEL OR 17X26 4PK STRL BLUE (TOWEL DISPOSABLE) ×2 IMPLANT
TRAP FLUID SMOKE EVACUATOR (MISCELLANEOUS) ×1 IMPLANT
WATER STERILE IRR 500ML POUR (IV SOLUTION) ×1 IMPLANT

## 2023-09-30 NOTE — Anesthesia Procedure Notes (Signed)
 Anesthesia Regional Block: Adductor canal block   Pre-Anesthetic Checklist: , timeout performed,  Correct Patient, Correct Site, Correct Laterality,  Correct Procedure, Correct Position, site marked,  Risks and benefits discussed,  Surgical consent,  Pre-op evaluation,  At surgeon's request and post-op pain management  Laterality: Right and Lower  Prep: chloraprep       Needles:  Injection technique: Single-shot  Needle Type: Stimiplex     Needle Length: 9cm  Needle Gauge: 22     Additional Needles:   Procedures:,,,, ultrasound used (permanent image in chart),,    Narrative:  Start time: 09/30/2023 1:14 PM End time: 09/30/2023 1:17 PM Injection made incrementally with aspirations every 5 mL.  Performed by: Personally  Anesthesiologist: Foye Deer, MD  Additional Notes: Patient consented for risk and benefits of nerve block including but not limited to nerve damage, failed block, bleeding and infection.  Patient voiced understanding.  Functioning IV was confirmed and monitors were applied.  Timeout done prior to procedure and prior to any sedation being given to the patient.  Patient confirmed procedure site prior to any sedation given to the patient. Sterile prep,hand hygiene and sterile gloves were used.  Minimal sedation used for procedure.  No paresthesia endorsed by patient during the procedure.  Negative aspiration and negative test dose prior to incremental administration of local anesthetic. The patient tolerated the procedure well with no immediate complications.

## 2023-09-30 NOTE — Plan of Care (Signed)

## 2023-09-30 NOTE — Interval H&P Note (Signed)
 History and Physical Interval Note:  09/30/2023 10:30 AM  Chad [name]  has presented today for surgery, with the diagnosis of UNSPECIFIED OPEN WOUND RIGHT LOWER LEG.  The various methods of treatment have been discussed with the patient and family. After consideration of risks, benefits and other options for treatment, the patient has consented to  Procedure(s): AMPUTATION OR REVISION OF AMPUTATION, BELOW KNEE (Right) as a surgical intervention.  The patient's history has been reviewed, patient examined, no change in status, stable for surgery.  I have reviewed the patient's chart and labs.  Questions were answered to the patient's satisfaction.     [name]

## 2023-09-30 NOTE — Anesthesia Procedure Notes (Signed)
 Procedure Name: MAC Date/Time: 09/30/2023 3:30 PM  Performed by: Pricilla Handler, RNPre-anesthesia Checklist: Patient identified, Emergency Drugs available, Suction available, Patient being monitored and Timeout performed Patient Re-evaluated:Patient Re-evaluated prior to induction Oxygen Delivery Method: Nasal cannula Preoxygenation: Pre-oxygenation with 100% oxygen

## 2023-09-30 NOTE — Anesthesia Procedure Notes (Signed)
 Anesthesia Regional Block: Popliteal block   Pre-Anesthetic Checklist: , timeout performed,  Correct Patient, Correct Site, Correct Laterality,  Correct Procedure, Correct Position, site marked,  Risks and benefits discussed,  Surgical consent,  Pre-op evaluation,  At surgeon's request and post-op pain management  Laterality: Right and Lower  Prep: chloraprep       Needles:  Injection technique: Single-shot  Needle Type: Stimiplex     Needle Length: 9cm  Needle Gauge: 22     Additional Needles:   Procedures:,,,, ultrasound used (permanent image in chart),,    Narrative:  Start time: 09/30/2023 1:16 PM End time: 09/30/2023 1:18 PM Injection made incrementally with aspirations every 5 mL.  Performed by: Personally  Anesthesiologist: Foye Deer, MD  Additional Notes: Patient consented for risk and benefits of nerve block including but not limited to nerve damage, failed block, bleeding and infection.  Patient voiced understanding.  Functioning IV was confirmed and monitors were applied.  Timeout done prior to procedure and prior to any sedation being given to the patient.  Patient confirmed procedure site prior to any sedation given to the patient. Sterile prep,hand hygiene and sterile gloves were used.  Minimal sedation used for procedure.  No paresthesia endorsed by patient during the procedure.  Negative aspiration and negative test dose prior to incremental administration of local anesthetic. The patient tolerated the procedure well with no immediate complications.

## 2023-09-30 NOTE — Op Note (Signed)
 OPERATIVE NOTE   PROCEDURE: Right below-the-knee amputation  PRE-OPERATIVE DIAGNOSIS: Right foot gangrene  POST-OPERATIVE DIAGNOSIS: same as above  SURGEON: Renford Dills, MD  ASSISTANT(S): Rolla Plate, NP  ANESTHESIA: general  ESTIMATED BLOOD LOSS: 75 cc  FINDING(S): Viable muscle bellies  SPECIMEN(S):  Right below-the-knee amputation  INDICATIONS:   Chad [name] is a 63 y.o. male who presents with right leg gangrene and nonhealing ulcers.  The patient is scheduled for a right below-the-knee amputation (it should be noted that above-knee amputation was also discussed as I believe this would be a more reliable scenario for healing however, the patient adamantly refused.  In the interest of removing the grossly infected tissues we have agreed to move forward with below-knee amputation understanding that healing may not be as straightforward).  I discussed in depth with the patient the risks, benefits, and alternatives to this procedure.  The patient is aware that the risk of this operation included but are not limited to:  bleeding, infection, myocardial infarction, stroke, death, failure to heal amputation wound, and possible need for more proximal amputation.  The patient is aware of the risks and agrees proceed forward with the procedure.  DESCRIPTION:  After full informed written consent was obtained from the patient, the patient was brought back to the operating room, and placed supine upon the operating table.  Prior to induction, the patient received IV antibiotics.  The patient was then prepped and draped in the standard fashion for a below-the-knee amputation.  After obtaining adequate anesthesia, the patient was prepped and draped in the standard fashion for a right below-the-knee amputation.  I marked out the incision and a fishmouth shaped given the extensive damage to the skin and the ankle especially posteriorly negated the standard posterior flap.   I made the  incisions for these flaps, and then dissected through the subcutaneous tissue, fascia, and muscle anteriorly.  I elevated  the periosteal tissue superiorly so that the tibia was about 3-4 cm shorter than the anterior skin flap.  I then transected the tibia with a power saw and then took a wedge off the tibia anteriorly with the power saw.  Then I smoothed out the rough edges.  In a similar fashion, I cut back the fibula about two centimeters higher than the level of the tibia with a bone cutter.  I put a bone hook into the distal tibia and then used a large amputation knife to sharply develop a tissue plane through the muscle along the fibula.  In such fashion, the posterior flap was developed.  At this point, the specimen was passed off the field as the below-the-knee amputation.  At this point, I clamped all visibly bleeding arteries and veins using a combination of suture ligation with Silk suture and electrocautery.  Bleeding continued to be controlled with electrocautery and suture ligature.  The stump was washed off with sterile normal saline and no further active bleeding was noted.  I reapproximated the anterior and posterior fascia  with interrupted stitches of 0 Vicryl.  This was completed along the entire length of anterior and posterior fascia until there were no more loose space in the fascial line. I then placed a layer of 2-0 Vicryl sutures in the subcutaneous tissue. The skin was then  reapproximated with staples.  The stump was washed off and dried.  The incision was dressed with Xeroform and  then fluffs were applied.  Kerlix was wrapped around the leg and then gently an ACE wrap  was applied.    COMPLICATIONS: none  CONDITION: stable   Chad [name]  09/30/2023, 3:12 PM    This note was created with Dragon Medical transcription system. Any errors in dictation are purely unintentional.

## 2023-09-30 NOTE — Anesthesia Preprocedure Evaluation (Addendum)
 Anesthesia Evaluation  Patient identified by MRN, date of birth, ID band Patient awake    Reviewed: Allergy & Precautions, NPO status , Patient's Chart, lab work & pertinent test results  History of Anesthesia Complications Negative for: history of anesthetic complications  Airway Mallampati: IV  TM Distance: >3 FB Neck ROM: Full    Dental  (+) Edentulous Upper, Edentulous Lower, Dental Advidsory Given, Poor Dentition, Missing   Pulmonary COPD,  COPD inhaler, Current Smoker and Patient abstained from smoking.   Pulmonary exam normal breath sounds clear to auscultation       Cardiovascular Exercise Tolerance: Poor (-) angina + Peripheral Vascular Disease (on Plavix)  Normal cardiovascular exam Rhythm:Regular Rate:Normal  ECG 11/07/22:  Normal sinus rhythm Minimal voltage criteria for LVH, may be normal variant ( R in aVL )  Coronary atherosclerosis on CT scan   Neuro/Psych  PSYCHIATRIC DISORDERS Anxiety Depression    Chronic pain due to neoplasm Managed by palliative care Continue home oxycodone CVA (residual left hand weakness 2018)    GI/Hepatic ,GERD  ,,(+) Hepatitis - (tx started 12/224), CLynch syndrome   Endo/Other  Hypothyroidism    Renal/GU Renal disease (CKD)   BPH Urothelial cancer    Musculoskeletal   Abdominal Normal abdominal exam  (+)   Peds  Hematology  (+) Blood dyscrasia, anemia   Anesthesia Other Findings  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; recurrent sigmoid colon cancer -stage II and multiple other comorbidities including PVD/active smoker  Past Medical History: No date: Anemia No date: Anxiety No date: ARF (acute respiratory failure) (HCC) No date: Atherosclerosis of arteries of extremities (HCC) No date: Benign prostatic hyperplasia with urinary obstruction No date: Bladder cancer (HCC) No date: BPH with  obstruction/lower urinary tract symptoms No date: Chronic viral hepatitis C (HCC) No date: Colon cancer (HCC) No date: COPD (chronic obstructive pulmonary disease) (HCC) No date: Depression No date: Dysphagia No date: Family history of colon cancer No date: Family history of kidney cancer No date: Family history of leukemia No date: Family history of prostate cancer No date: Generalized anxiety disorder No date: 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 No date: GERD (gastroesophageal reflux disease) No date: Hepatitis     Comment:  chronic hep c No date: Hydronephrosis No date: Hydronephrosis with ureteral stricture No date: Hyperlipidemia No date: Insomnia, unspecified No date: Knee pain     Comment:  Left No date: Major depressive disorder, recurrent, moderate (HCC) No date: Malignant neoplasm of colon (HCC) No date: Malnutrition (HCC) No date: Muscle weakness (generalized) No date: Nerve pain No date: Other abnormalities of gait and mobility No date: Other chronic pain No date: Peripheral vascular disease (HCC) No date: Personal history of transient cerebral ischemia No date: Pressure ulcer No date: Prostate cancer (HCC) No date: Stroke [facility]) No date: Tinea unguium No date: Tobacco user No date: Unspecified protein-calorie malnutrition (HCC) No date: Ureteral cancer, right (HCC) No date: Urinary frequency No date: Venous hypertension of both lower extremities No date: Xerosis cutis  Past Surgical History: No date: COLON SURGERY     Comment:  En bloc extended right hemicolectomy 07/2017 11/06/2020: COLONOSCOPY WITH PROPOFOL; N/A     Comment:  Procedure: COLONOSCOPY WITH PROPOFOL;  Surgeon: Wyline Mood, MD;  Location: [facility] ENDOSCOPY;  Service:               Gastroenterology;  Laterality: N/A; 07/31/2021: COLONOSCOPY WITH PROPOFOL; N/A     Comment:  Procedure: COLONOSCOPY WITH PROPOFOL;  Surgeon: Earline Mayotte, MD;  Location:  [facility] ENDOSCOPY;  Service:               Endoscopy;  Laterality: N/A; 08/30/2018: CYSTOSCOPY W/ RETROGRADES; Right     Comment:  Procedure: CYSTOSCOPY WITH RETROGRADE PYELOGRAM;                Surgeon: Vanna Scotland, MD;  Location: [facility] ORS;                Service: Urology;  Laterality: Right; 04/25/2018: CYSTOSCOPY WITH STENT PLACEMENT; Right     Comment:  Procedure: CYSTOSCOPY WITH STENT PLACEMENT;  Surgeon:               Vanna Scotland, MD;  Location: [facility] ORS;  Service:               Urology;  Laterality: Right; 08/30/2018: CYSTOSCOPY WITH STENT PLACEMENT; Right     Comment:  Procedure: CYSTOSCOPY WITH STENT Exchange;  Surgeon:               Vanna Scotland, MD;  Location: [facility] ORS;  Service:               Urology;  Laterality: Right; 03/07/2019: CYSTOSCOPY WITH STENT PLACEMENT; Right     Comment:  Procedure: CYSTOSCOPY WITH STENT Exchange;  Surgeon:               Vanna Scotland, MD;  Location: [facility] ORS;  Service:               Urology;  Laterality: Right; 11/21/2019: CYSTOSCOPY WITH STENT PLACEMENT; Right     Comment:  Procedure: CYSTOSCOPY WITH STENT Exchange;  Surgeon:               Vanna Scotland, MD;  Location: [facility] ORS;  Service:               Urology;  Laterality: Right; 05/23/2019: LOWER EXTREMITY ANGIOGRAPHY; Left     Comment:  Procedure: LOWER EXTREMITY ANGIOGRAPHY;  Surgeon: Annice Needy, MD;  Location: [facility] INVASIVE CV LAB;  Service:               Cardiovascular;  Laterality: Left; 05/30/2019: LOWER EXTREMITY ANGIOGRAPHY; Right     Comment:  Procedure: LOWER EXTREMITY ANGIOGRAPHY;  Surgeon: Annice Needy, MD;  Location: [facility] INVASIVE CV LAB;  Service:               Cardiovascular;  Laterality: Right; 02/13/2020: LOWER EXTREMITY ANGIOGRAPHY; Right     Comment:  Procedure: LOWER EXTREMITY ANGIOGRAPHY;  Surgeon: Annice Needy, MD;  Location: [facility];  Service:               Cardiovascular;  Laterality:  Right; 02/20/2020: LOWER EXTREMITY ANGIOGRAPHY; Left     Comment:  Procedure: LOWER EXTREMITY ANGIOGRAPHY;  Surgeon: Annice Needy, MD;  Location: [facility] CV [facility];  Service:  Cardiovascular;  Laterality: Left; 01/01/2023: LOWER EXTREMITY ANGIOGRAPHY; Left     Comment:  Procedure: Lower Extremity Angiography;  Surgeon: Annice Needy, MD;  Location: [facility] INVAS[facility] CV LAB;  Service:               Cardiovascular;  Laterality: Left; 02/28/2019: PORTA CATH INSERTION; N/A     Comment:  Procedure: PORTA CATH INSERTION;  Surgeon: Annice Needy,              MD;  Location: [facility] INVASIVE CV LAB;  Service:               Cardiovascular;  Laterality: N/A; 11/17/2022: ROBOT ASSISTED LAPAROSCOPIC PARTIAL COLECTOMY No date: tumor removed   BMI    Body Mass Index: 19.23 kg/m      Reproductive/Obstetrics negative OB ROS                             Anesthesia Physical Anesthesia Plan  ASA: 3  Anesthesia Plan: General   Post-op Pain Management: Regional block*   Induction: Intravenous  PONV Risk Score and Plan: 2 and Ondansetron, Dexamethasone and Midazolam  Airway Management Planned: Natural Airway  Additional Equipment:   Intra-op Plan:   Post-operative Plan:   Informed Consent: I have reviewed the patients History and Physical, chart, labs and discussed the procedure including the risks, benefits and alternatives for the proposed anesthesia with the patient or authorized representative who has indicated his/her understanding and acceptance.     Dental Advisory Given  Plan Discussed with: Anesthesiologist, CRNA and Surgeon  Anesthesia Plan Comments:         Anesthesia Quick Evaluation

## 2023-09-30 NOTE — Transfer of Care (Signed)
 Immediate Anesthesia Transfer of Care Note  Patient: Chad [name]  Procedure(s) Performed: AMPUTATION OF AMPUTATION, BELOW KNEE (Right: Knee)  Patient Location: PACU  Anesthesia Type:MAC and Regional  Level of Consciousness: sedated  Airway & Oxygen Therapy: Patient Spontanous Breathing and Patient connected to nasal cannula oxygen  Post-op Assessment: Report given to RN and Post -op Vital signs reviewed and stable  Post vital signs: Reviewed and stable  Last Vitals:  Vitals Value Taken Time  BP 98/64 09/30/23 1451  Temp 36.9 C 09/30/23 1449  Pulse 83 09/30/23 1452  Resp 17 09/30/23 1452  SpO2 95 % 09/30/23 1452  Vitals shown include unfiled device data.  Last Pain:  Vitals:   09/30/23 1449  TempSrc: Tympanic  PainSc: 0-No pain         Complications: No notable events documented.

## 2023-10-01 ENCOUNTER — Encounter: Payer: Self-pay | Admitting: Vascular Surgery

## 2023-10-01 ENCOUNTER — Inpatient Hospital Stay

## 2023-10-01 DIAGNOSIS — Z9889 Other specified postprocedural states: Secondary | ICD-10-CM

## 2023-10-01 DIAGNOSIS — I70299 Other atherosclerosis of native arteries of extremities, unspecified extremity: Secondary | ICD-10-CM

## 2023-10-01 DIAGNOSIS — L97909 Non-pressure chronic ulcer of unspecified part of unspecified lower leg with unspecified severity: Secondary | ICD-10-CM

## 2023-10-01 DIAGNOSIS — Z89511 Acquired absence of right leg below knee: Secondary | ICD-10-CM

## 2023-10-01 LAB — BASIC METABOLIC PANEL
Anion gap: 8 (ref 5–15)
BUN: 16 mg/dL (ref 8–23)
CO2: 23 mmol/L (ref 22–32)
Calcium: 8.2 mg/dL — ABNORMAL LOW (ref 8.9–10.3)
Chloride: 105 mmol/L (ref 98–111)
Creatinine, Ser: 0.85 mg/dL (ref 0.61–1.24)
GFR, Estimated: >60 mL/min (ref >60)
Glucose, Bld: 133 mg/dL — ABNORMAL HIGH (ref 70–99)
Potassium: 3.6 mmol/L (ref 3.5–5.1)
Sodium: 136 mmol/L (ref 135–145)

## 2023-10-01 LAB — CBC
HCT: 22.9 % — ABNORMAL LOW (ref 39.0–52.0)
Hemoglobin: 7.3 g/dL — ABNORMAL LOW (ref 13.0–17.0)
MCH: 25.8 pg — ABNORMAL LOW (ref 26.0–34.0)
MCHC: 31.9 g/dL (ref 30.0–36.0)
MCV: 80.9 fL (ref 80.0–100.0)
Platelets: 259 10*3/uL (ref 150–400)
RBC: 2.83 MIL/uL — ABNORMAL LOW (ref 4.22–5.81)
RDW: 16.6 % — ABNORMAL HIGH (ref 11.5–15.5)
WBC: 10.4 10*3/uL (ref 4.0–10.5)
nRBC: 0 % (ref 0.0–0.2)

## 2023-10-01 LAB — CBC WITH DIFFERENTIAL/PLATELET
Abs Immature Granulocytes: 0.08 10*3/uL — ABNORMAL HIGH (ref 0.00–0.07)
Basophils Absolute: 0 10*3/uL (ref 0.0–0.1)
Basophils Relative: 0 %
Eosinophils Absolute: 0 10*3/uL (ref 0.0–0.5)
Eosinophils Relative: 0 %
HCT: 21.4 % — ABNORMAL LOW (ref 39.0–52.0)
Hemoglobin: 6.8 g/dL — ABNORMAL LOW (ref 13.0–17.0)
Immature Granulocytes: 1 %
Lymphocytes Relative: 6 %
Lymphs Abs: 0.7 10*3/uL (ref 0.7–4.0)
MCH: 25.9 pg — ABNORMAL LOW (ref 26.0–34.0)
MCHC: 31.8 g/dL (ref 30.0–36.0)
MCV: 81.4 fL (ref 80.0–100.0)
Monocytes Absolute: 1.3 10*3/uL — ABNORMAL HIGH (ref 0.1–1.0)
Monocytes Relative: 10 %
Neutro Abs: 10.2 10*3/uL — ABNORMAL HIGH (ref 1.7–7.7)
Neutrophils Relative %: 83 %
Platelets: 252 10*3/uL (ref 150–400)
RBC: 2.63 MIL/uL — ABNORMAL LOW (ref 4.22–5.81)
RDW: 16.7 % — ABNORMAL HIGH (ref 11.5–15.5)
WBC: 12.2 10*3/uL — ABNORMAL HIGH (ref 4.0–10.5)
nRBC: 0 % (ref 0.0–0.2)

## 2023-10-01 LAB — COMPREHENSIVE METABOLIC PANEL
ALT: 17 U/L (ref 0–44)
AST: 47 U/L — ABNORMAL HIGH (ref 15–41)
Albumin: 2.1 g/dL — ABNORMAL LOW (ref 3.5–5.0)
Alkaline Phosphatase: 90 U/L (ref 38–126)
Anion gap: 7 (ref 5–15)
BUN: 15 mg/dL (ref 8–23)
CO2: 23 mmol/L (ref 22–32)
Calcium: 7.8 mg/dL — ABNORMAL LOW (ref 8.9–10.3)
Chloride: 103 mmol/L (ref 98–111)
Creatinine, Ser: 0.96 mg/dL (ref 0.61–1.24)
GFR, Estimated: >60 mL/min (ref >60)
Glucose, Bld: 193 mg/dL — ABNORMAL HIGH (ref 70–99)
Potassium: 3.2 mmol/L — ABNORMAL LOW (ref 3.5–5.1)
Sodium: 133 mmol/L — ABNORMAL LOW (ref 135–145)
Total Bilirubin: 0.6 mg/dL (ref 0.0–1.2)
Total Protein: 5.9 g/dL — ABNORMAL LOW (ref 6.5–8.1)

## 2023-10-01 LAB — URINALYSIS, ROUTINE W REFLEX MICROSCOPIC
Bilirubin Urine: NEGATIVE
Glucose, UA: NEGATIVE mg/dL
Ketones, ur: NEGATIVE mg/dL
Leukocytes,Ua: NEGATIVE
Nitrite: NEGATIVE
Protein, ur: NEGATIVE mg/dL
Specific Gravity, Urine: 1.016 (ref 1.005–1.030)
pH: 5 (ref 5.0–8.0)

## 2023-10-01 LAB — PROCALCITONIN: Procalcitonin: 0.2 ng/mL

## 2023-10-01 LAB — PREPARE RBC (CROSSMATCH)

## 2023-10-01 LAB — RESP PANEL BY RT-PCR (RSV, FLU A&B, COVID)  RVPGX2
Influenza A by PCR: NEGATIVE
Influenza B by PCR: NEGATIVE
Resp Syncytial Virus by PCR: NEGATIVE
SARS Coronavirus 2 by RT PCR: NEGATIVE

## 2023-10-01 LAB — LACTIC ACID, PLASMA
Lactic Acid, Venous: 2.6 mmol/L (ref 0.5–1.9)
Lactic Acid, Venous: 3 mmol/L (ref 0.5–1.9)

## 2023-10-01 MED ORDER — VITAMIN C 500 MG PO TABS
500.0000 mg | ORAL_TABLET | Freq: Every day | ORAL | Status: DC
  Administered 2023-10-02: 500 mg via ORAL
  Filled 2023-10-01: qty 1

## 2023-10-01 MED ORDER — MUPIROCIN 2 % EX OINT
1.0000 | TOPICAL_OINTMENT | Freq: Two times a day (BID) | CUTANEOUS | Status: AC
  Administered 2023-10-01 – 2023-10-05 (×10): 1 via NASAL
  Filled 2023-10-01: qty 22

## 2023-10-01 MED ORDER — SODIUM CHLORIDE 0.9 % IV BOLUS
1000.0000 mL | Freq: Once | INTRAVENOUS | Status: AC
  Administered 2023-10-01: 1000 mL via INTRAVENOUS

## 2023-10-01 MED ORDER — VANCOMYCIN HCL 500 MG/100ML IV SOLN
500.0000 mg | Freq: Once | INTRAVENOUS | Status: AC
  Administered 2023-10-02: 500 mg via INTRAVENOUS
  Filled 2023-10-01: qty 100

## 2023-10-01 MED ORDER — POTASSIUM CHLORIDE CRYS ER 20 MEQ PO TBCR
40.0000 meq | EXTENDED_RELEASE_TABLET | Freq: Once | ORAL | Status: AC
  Administered 2023-10-01: 40 meq via ORAL
  Filled 2023-10-01: qty 2

## 2023-10-01 MED ORDER — SODIUM CHLORIDE 0.9% IV SOLUTION: Freq: Once | INTRAVENOUS | Status: AC

## 2023-10-01 MED ORDER — SODIUM CHLORIDE 0.9 % IV BOLUS
1000.0000 mL | Freq: Once | INTRAVENOUS | Status: AC
  Administered 2023-10-02: 1000 mL via INTRAVENOUS

## 2023-10-01 MED ORDER — VANCOMYCIN HCL 750 MG/150ML IV SOLN: 750.0000 mg | Freq: Two times a day (BID) | INTRAVENOUS | Status: DC

## 2023-10-01 MED ORDER — VITAMIN D (ERGOCALCIFEROL) 1.25 MG (50000 UNIT) PO CAPS
50000.0000 [IU] | ORAL_CAPSULE | ORAL | Status: DC
  Administered 2023-10-02: 50000 [IU] via ORAL
  Filled 2023-10-01: qty 1

## 2023-10-01 MED ORDER — CHLORHEXIDINE GLUCONATE CLOTH 2 % EX PADS
6.0000 | MEDICATED_PAD | Freq: Every day | CUTANEOUS | Status: AC
  Administered 2023-10-02 – 2023-10-04 (×2): 6 via TOPICAL

## 2023-10-01 MED ORDER — SODIUM CHLORIDE 0.9 % IV SOLN: INTRAVENOUS | Status: AC

## 2023-10-01 NOTE — Progress Notes (Signed)
 MEWS Progress Note  Patient Details Name: Chad [name] MRN: [id number] DOB: 08/22/1960 Today's Date: 10/01/2023   MEWS Flowsheet Documentation:  Assess: MEWS Score Temp: (!) 101.2 F (38.4 C) BP: 105/70 MAP (mmHg): 79 Pulse Rate: (!) 117 ECG Heart Rate: 89 Resp: 18 Level of Consciousness: Alert SpO2: 92 % O2 Device: Room Air Patient Activity (if Appropriate): In bed O2 Flow Rate (L/min): 2 L/min Assess: MEWS Score MEWS Temp: 1 MEWS Systolic: 0 MEWS Pulse: 2 MEWS RR: 0 MEWS LOC: 0 MEWS Score: 3 MEWS Score Color: Yellow Assess: SIRS CRITERIA SIRS Temperature : 1 SIRS Respirations : 0 SIRS Pulse: 1 SIRS WBC: 0 SIRS Score Sum : 2 Assess: if the MEWS score is Yellow or Red Were vital signs accurate and taken at a resting state?: Yes Does the patient meet 2 or more of the SIRS criteria?: Yes Does the patient have a confirmed or suspected source of infection?: No MEWS guidelines implemented : No, previously yellow, continue vital signs every 4 hours Notify: Charge Nurse/RN Name of Charge Nurse/RN Notified: Scientist, water quality Provider Notification Provider Name/Title: Dr Iverson Alamin NP Date Provider Notified: 10/01/23 Time Provider Notified: (P) 1554 Method of Notification: (P) Page Notification Reason: (P) Other (Comment) (Yellow Mews of 3) Provider response: (P) See new orders Date of Provider Response: (P) 10/01/23 Time of Provider Response: (P) 1559   Pt has been a yellow mews of 2. After reassessing the VS, pt noted to have an TMAX of 101.22F and a HR of 117. Notified MD and NP, tylenol PO given, pain meds for pt given IV. See new orders for additional info.    Adair Laundry 10/01/2023, 4:19 PM

## 2023-10-01 NOTE — TOC Initial Note (Signed)
 Transition of Care [facility]) - Initial/Assessment Note    Patient Details  Name: Chad [name] MRN: [id number] Date of Birth: April 22, 1961  [facility]) CM/SW Contact:    Margarito Liner, LCSW Phone Number: 10/01/2023, 4:20 PM  Clinical Narrative:  CSW met with patient. No supports at bedside. CSW introduced role and explained that discharge planning would be discussed. Patient confirmed he is a long-term resident at [facility]. Admissions coordinator confirmed as well. CSW asked vascular NP to consult PT and OT if they feel that he needs rehab when he returns to SNF since he had BKA yesterday. Patient is agreeable if this is recommended. No further concerns. CSW will continue to follow patient for support and facilitate return to SNF once medically stable.                Expected Discharge Plan: Skilled Nursing Facility Barriers to Discharge: Continued Medical Work up   Patient Goals and CMS Choice            Expected Discharge Plan and Services     Post Acute Care Choice: Skilled Nursing Facility Living arrangements for the past 2 months: Skilled Nursing Facility                                      Prior Living Arrangements/Services Living arrangements for the past 2 months: Skilled Nursing Facility Lives with:: Facility Resident Patient language and need for interpreter reviewed:: Yes Do you feel safe going back to the place where you live?: Yes      Need for Family Participation in Patient Care: Yes (Comment) Care giver support system in place?: Yes (comment)   Criminal Activity/Legal Involvement Pertinent to Current Situation/Hospitalization: No - Comment as needed  Activities of Daily Living   ADL Screening (condition at time of admission) Independently performs ADLs?: No Does the patient have a NEW difficulty with bathing/dressing/toileting/self-feeding that is expected to last >3 days?: No Does the patient have a NEW difficulty with  getting in/out of bed, walking, or climbing stairs that is expected to last >3 days?: No Does the patient have a NEW difficulty with communication that is expected to last >3 days?: No Is the patient deaf or have difficulty hearing?: No Does the patient have difficulty seeing, even when wearing glasses/contacts?: No Does the patient have difficulty concentrating, remembering, or making decisions?: No  Permission Sought/Granted Permission sought to share information with : Facility Industrial/product designer granted to share information with : Yes, Verbal Permission Granted     Permission granted to share info w AGENCY: [facility]        Emotional Assessment Appearance:: Appears stated age Attitude/Demeanor/Rapport: Engaged Affect (typically observed): Accepting, Quiet Orientation: : Oriented to Self, Oriented to Place, Oriented to  Time, Oriented to Situation Alcohol / Substance Use: Not Applicable Psych Involvement: No (comment)  Admission diagnosis:  Atherosclerosis of artery of extremity with ulceration (HCC) [N82.956, L97.909] Patient Active Problem List   Diagnosis Date Noted   Atherosclerosis of artery of extremity with ulceration (HCC) 09/30/2023   Need for hepatitis B screening test 07/01/2023   Screening for HIV (human immunodeficiency virus) 07/01/2023   Chronic hepatitis C without hepatic coma (HCC) 06/17/2023   Open wound 06/16/2023   Medication management 06/16/2023   Protein-calorie malnutrition (HCC) 06/16/2023   Gangrene of toe (HCC) 02/27/2023   Non-healing wound of left lower extremity  02/26/2023   Tobacco abuse 02/26/2023   Cancer of sigmoid colon (HCC) 11/17/2022   Abnormal CT scan 10/23/2020   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/15/2019   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   Genetic testing 06/15/2019   Swelling of limb 04/15/2019   History of colon cancer 03/24/2019   Family history of prostate cancer     Family history of colon cancer    Family history of leukemia    Family history of kidney cancer    Goals of care, counseling/discussion 12/25/2018   Chronic pain syndrome 11/18/2018   Peripheral vascular disease of lower extremity with ulceration (HCC) 10/04/2018   Neoplasm related pain 09/08/2018   Poor appetite 09/08/2018   Weakness generalized 07/02/2018   Anorexia 07/02/2018   Chronic pain due to neoplasm 07/02/2018   Iron deficiency anemia due to chronic blood loss 05/27/2018   Ureteral cancer, 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   Palliative care by specialist    Palliative care encounter    Hydronephrosis    Urothelial cancer (HCC)    Dysphagia    Protein-calorie malnutrition, severe 04/26/2018   Severe sepsis (HCC) 04/25/2018   Sepsis (HCC) 04/25/2018   PCP:  Everardo Beals, MD Pharmacy:   [phone] - [place], [place] - [address] [place] [address] Phone: [phone] Fax: [phone]  [id number] [place], [place] - [address] AT [facility] OF [address] [facility] [address] [place] [address] Phone: [phone] Fax: [phone]  [facility] - [place], [place] - [address] [place] [address] Phone: [phone] Fax: [phone]  [facility] [address] [place] [address] Phone: [phone] Fax: [phone]     Social Drivers of Health (SDOH) Social History: SDOH Screenings   Food Insecurity: No Food Insecurity (09/30/2023)  Housing: Unknown (09/30/2023)  Transportation Needs: No Transportation Needs (09/30/2023)  Utilities: Not At Risk (09/30/2023)  Depression (PHQ2-9): Low Risk  (07/01/2023)  Social Connections: Moderately Integrated (09/30/2023)  Tobacco Use: High Risk (09/30/2023)   SDOH Interventions:     Readmission Risk Interventions     No data to display

## 2023-10-01 NOTE — Progress Notes (Signed)
  Progress Note    10/01/2023 11:59 AM 1 Day Post-Op  Subjective:  Chad [name] is a 63 yo male who is now POD #1 from right below the knee amputation. Patient is recovering as expected. Endorses pain to his right leg. No complications overnight. Vitals all remain stable.    Vitals:   10/01/23 0411 10/01/23 0816  BP: 117/70 127/80  Pulse: (!) 111 (!) 112  Resp: 18 18  Temp: 98.4 F (36.9 C) 100.1 F (37.8 C)  SpO2: 96% 95%   Physical Exam: Cardiac:  RRR, Normal S1, S2. No murmurs. Lungs:  Non labored breathing, Rhonchi on auscultation. No rales or wheezing.  Incisions:  Right lower extremity BKA.  Extremities:  Right lower extremity BKA. Dressing clean dry and intact.  Abdomen:  Positive bowel sounds throughout, Soft, non tender and non distended.  Neurologic: AAOX1. Person only. Flat affect.   CBC    Component Value Date/Time   WBC 10.4 10/01/2023 0600   RBC 2.83 (L) 10/01/2023 0600   HGB 7.3 (L) 10/01/2023 0600   HGB 7.1 (L) 08/14/2023 0924   HCT 22.9 (L) 10/01/2023 0600   PLT 259 10/01/2023 0600   PLT 204 08/14/2023 0924   MCV 80.9 10/01/2023 0600   MCH 25.8 (L) 10/01/2023 0600   MCHC 31.9 10/01/2023 0600   RDW 16.6 (H) 10/01/2023 0600   LYMPHSABS 1.1 08/14/2023 0924   MONOABS 0.5 08/14/2023 0924   EOSABS 0.4 08/14/2023 0924   BASOSABS 0.1 08/14/2023 0924    BMET    Component Value Date/Time   NA 136 10/01/2023 0600   K 3.6 10/01/2023 0600   CL 105 10/01/2023 0600   CO2 23 10/01/2023 0600   GLUCOSE 133 (H) 10/01/2023 0600   BUN 16 10/01/2023 0600   CREATININE 0.85 10/01/2023 0600   CREATININE 1.09 08/14/2023 0924   CREATININE 1.07 07/01/2023 1359   CALCIUM 8.2 (L) 10/01/2023 0600   GFRNONAA >60 10/01/2023 0600   GFRNONAA >60 08/14/2023 0924   GFRAA >60 04/19/2020 0858    INR    Component Value Date/Time   INR 1.0 07/01/2023 1359     Intake/Output Summary (Last 24 hours) at 10/01/2023 1159 Last data filed at 09/30/2023 1700 Gross per 24 hour   Intake 1400 ml  Output 50 ml  Net 1350 ml     Assessment/Plan:  63 y.o. male is s/p Right lower extremity BKA 1 Day Post-Op   Plan Pain medication PRN  Advance Diet as tolerated. OOB to chair with assist during the day First Dressing change by Vascular Surgery   DVT prophylaxis:  ASA 81 mg Daily, plavix 75 mg Daily   Chad [name] [name] 10/01/2023 11:59 AM

## 2023-10-01 NOTE — Consult Note (Signed)
 Pharmacy Antibiotic Note  Chad [name] is a 63 y.o. male admitted on 09/30/2023 with  right foot gangrene . Patient is POD #1 from right below the knee amputation. Pharmacy has been consulted for vancomycin dosing.  Today, 10/01/2023 Day 1 of vancomycin  WBC 7.2 > 12.2  Febrile with Tmax 102.7 Tachycardic   Lactate 2.6  SCr 0.96 today with estimated CrCl of 73.8 mL/min  Patient received a one time vancomycin 1000 mg dose yesterday, 09/30/23 @ 1320   Plan: Give an additional 500 mg today to complete a 1500 mg loading dose, followed by vancomycin 750 mg IV Q12H Goal AUC 400-550  Estimated AUC 466.7 Estimated Cmin 13.7  SCr used 0.96, TBW, Vd 0.72 Pharmacy will continue to monitor and dose adjust appropriately   Height: 5\' 9"  (175.3 cm) Weight: 68 kg (150 lb) IBW/kg (Calculated) : 70.7  Temp (24hrs), Avg:99.9 F (37.7 C), Min:98.3 F (36.8 C), Max:101.2 F (38.4 C)  Recent Labs  Lab 09/30/23 1155 09/30/23 1156 10/01/23 0600 10/01/23 1642 10/01/23 1709  WBC  --  7.2 10.4 12.2*  --   CREATININE 0.93  --  0.85 0.96  --   LATICACIDVEN  --   --   --   --  2.6*    Estimated Creatinine Clearance: 73.8 mL/min (by C-G formula based on SCr of 0.96 mg/dL).    Allergies  Allergen Reactions   Penicillins Rash   Antimicrobials this admission: 03/05 vancomycin x 1 03/06 vancomycin >>   Dose adjustments this admission:   Microbiology results: 03/05 surgical PCR: (+) MRSA 03/06 BCx: sent  Thank you for allowing pharmacy to be a part of this patient's care.  Littie Deeds, PharmD Pharmacy Resident  10/01/2023 7:16 PM

## 2023-10-01 NOTE — Progress Notes (Signed)
 Progress Note:  Informed by nursing this afternoon the patient had a fever of 102.7.  I ordered a chest x-ray, blood cultures x 2, CBC, CMP, urine analysis and start incentive spirometry.  Hospitalist consultation was placed for further evaluation postoperative fevers.  Patient had Tylenol ordered for fevers.  We will follow-up after completion of ordered testing.

## 2023-10-01 NOTE — Consult Note (Addendum)
 Initial Consultation Note   Patient: Chad [name] ZOX:[id number] DOB: 04/15/1961 PCP: Everardo Beals, MD DOA: 09/30/2023 DOS: the patient was seen and examined on 10/01/2023 Primary service: Renford Dills, MD  Referring physician: Rolla Plate NP Reason for consult: Fever, Antibiotic recommendations  Assessment and Plan: #Sepsis, suspect Osteomyelitis vs Surgical Site Infection SIRS criteria: Temp 38.4, HR 117, Lactic Acid 2.6 - MRI (L) LE - Vancomycin per pharmacy - IV Fluids - 2L resuscitation followed by 75 mL/hr - Follow Blood cultures - Follow Fever curve - Check Lactic acid and PCT  #Peripheral Vascular Disease s/p (R) BKA this admission, prior (L) BKA - Aspirin/Plavix per primary team  #Postoperative Anemia - Transfuse 1U PRBCs  #Hypokalemia - 40 mEQ PO K - Repeat BMP with AM labs  #Protein Calorie Malnutrition - Dietitian consulted  #Hyperlipidemia - Continue Home Lipitor  #Tobacco Use Disorder - Counseled on cessation - Declined nicotine replacement therapy while inpatient  TRH will continue to follow the patient.  HPI: Chad [name] is a 63 y.o. male with past medical history of  tobacco abuse, stage IV urethral cancer, history of stage III colon cancer status post hemicolectomy, urothelial cancer, COPD, GERD, hepatitis C, tobacco use disorder, anxiety, depression, CVA, hyperlipidemia, and PVD s/p bilateral BKAs with most recent BKA on (R) 09/30/2023. Today is POD#1. TRH is consulted for evaluation of new fever of 38.4C.  Mr. Risden denies any subjective systemic symptoms of infection.  He reports he did have a cough and cold the week preceding admission.  Reports cough was not productive and denied nasal and chest congestion.  Specifically denies fatigue, nausea, vomiting, diarrhea, abdominal pain.  Denies known sick contacts prior to hospitalization yesterday, though he does live in a facility.  New right BKA is currently covered by surgical dressing that  Vascular has not taken down yet and site not directly visualized on exam today.  Review of Systems: As mentioned in the history of present illness. All other systems reviewed and are negative. 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    GERD (gastroesophageal reflux disease)    Hepatitis    chronic hep c   Hydronephrosis    Hydronephrosis with ureteral stricture    Hyperlipidemia    Insomnia, unspecified    Knee pain    Left   Major depressive disorder, recurrent, moderate (HCC)    Malignant neoplasm of colon (HCC)    Malnutrition (HCC)    Muscle weakness (generalized)    Nerve pain    Other abnormalities of gait and mobility    Other chronic pain    Peripheral vascular disease (HCC)    Personal history of transient cerebral ischemia    Pressure ulcer    Prostate cancer (HCC)    Stroke (HCC)  Tinea unguium    Tobacco user    Unspecified protein-calorie malnutrition (HCC)    Ureteral cancer, right (HCC)    Urinary frequency    Venous hypertension of both lower extremities    Xerosis cutis    Past Surgical History:  Procedure Laterality Date   AMPUTATION Left 03/03/2023   Procedure: AMPUTATION BELOW KNEE;  Surgeon: Renford Dills, MD;  Location: [facility];  Service: Vascular;  Laterality: Left;   AMPUTATION Right 09/30/2023   Procedure: AMPUTATION OF AMPUTATION, BELOW  KNEE;  Surgeon: Renford Dills, MD;  Location: [facility];  Service: Vascular;  Laterality: Right;   COLON SURGERY     En bloc extended right hemicolectomy 07/2017   COLONOSCOPY WITH PROPOFOL N/A 11/06/2020   Procedure: COLONOSCOPY WITH PROPOFOL;  Surgeon: Wyline Mood, MD;  Location: [facility];  Service: Gastroenterology;  Laterality: N/A;   COLONOSCOPY WITH PROPOFOL N/A 07/31/2021   Procedure: COLONOSCOPY WITH PROPOFOL;  Surgeon: Earline Mayotte, MD;  Location: [facility] ENDOSCOPY;  Service: Endoscopy;  Laterality: N/A;   CYSTOSCOPY W/ RETROGRADES Right 08/30/2018   Procedure: CYSTOSCOPY WITH RETROGRADE PYELOGRAM;  Surgeon: Vanna Scotland, MD;  Location: [facility];  Service: Urology;  Laterality: Right;   CYSTOSCOPY WITH STENT PLACEMENT Right 04/25/2018   Procedure: CYSTOSCOPY WITH STENT PLACEMENT;  Surgeon: Vanna Scotland, MD;  Location: [facility];  Service: Urology;  Laterality: Right;   CYSTOSCOPY WITH STENT PLACEMENT Right 08/30/2018   Procedure: CYSTOSCOPY WITH STENT Exchange;  Surgeon: Vanna Scotland, MD;  Location: [facility];  Service: Urology;  Laterality: Right;   CYSTOSCOPY WITH STENT PLACEMENT Right 03/07/2019   Procedure: CYSTOSCOPY WITH STENT Exchange;  Surgeon: Vanna Scotland, MD;  Location: [facility];  Service: Urology;  Laterality: Right;   CYSTOSCOPY WITH STENT PLACEMENT Right 11/21/2019   Procedure: CYSTOSCOPY WITH STENT Exchange;  Surgeon: Vanna Scotland, MD;  Location: [facility];  Service: Urology;  Laterality: Right;   LOWER EXTREMITY ANGIOGRAPHY Left 05/23/2019   Procedure: LOWER EXTREMITY ANGIOGRAPHY;  Surgeon: Annice Needy, MD;  Location: [facility];  Service: Cardiovascular;  Laterality: Left;   LOWER EXTREMITY ANGIOGRAPHY Right 05/30/2019   Procedure: LOWER EXTREMITY ANGIOGRAPHY;  Surgeon: Annice Needy, MD;  Location: [facility];  Service: Cardiovascular;  Laterality: Right;   LOWER EXTREMITY ANGIOGRAPHY Right 02/13/2020   Procedure: LOWER  EXTREMITY ANGIOGRAPHY;  Surgeon: Annice Needy, MD;  Location: [facility];  Service: Cardiovascular;  Laterality: Right;   LOWER EXTREMITY ANGIOGRAPHY Left 02/20/2020   Procedure: LOWER EXTREMITY ANGIOGRAPHY;  Surgeon: Annice Needy, MD;  Location: [facility];  Service: Cardiovascular;  Laterality: Left;   LOWER EXTREMITY ANGIOGRAPHY Left 01/01/2023   Procedure: Lower Extremity Angiography;  Surgeon: Annice Needy, MD;  Location: [facility];  Service: Cardiovascular;  Laterality: Left;   PORTA CATH INSERTION N/A 02/28/2019   Procedure: PORTA CATH INSERTION;  Surgeon: Annice Needy, MD;  Location: [facility];  Service: Cardiovascular;  Laterality: N/A;   ROBOT ASSISTED LAPAROSCOPIC PARTIAL COLECTOMY  11/17/2022   tumor removed      Social History:  reports that he has been smoking cigarettes. He has never been exposed to tobacco smoke. He has never used smokeless tobacco. He reports that he does not currently use alcohol. He reports that he does not currently use drugs after having used the following drugs: Marijuana and Methylphenidate.  Allergies  Allergen Reactions   Penicillins Rash    Family History  Problem Relation  Age of Onset   Prostate cancer Father 66   Cancer Brother 54       unsure type   Cancer Paternal Uncle        unsure type   Cancer Maternal Grandmother        unsure type   Cancer Paternal Grandmother        unsure type   Kidney cancer Paternal Grandfather    Cancer Other        unsure types   Leukemia Son    Cancer Son        other cancers, possibly colon    Prior to Admission medications   Medication Sig Start Date End Date Taking? Authorizing Provider  acetaminophen (TYLENOL) 325 MG tablet Take 650 mg by mouth 3 (three) times daily.    Yes [provider]  Amino Acids-Protein Hydrolys (PRO-STAT) LIQD Take 45 mLs by mouth daily.   Yes [provider]  ascorbic acid (VITAMIN C) 500 MG tablet Take 1 tablet  (500 mg total) by mouth 2 (two) times daily. 03/06/23  Yes Pennie Banter, DO  aspirin EC 81 MG tablet Take 1 tablet (81 mg total) by mouth daily. 05/23/19  Yes Dew, Marlow Baars, MD  atorvastatin (LIPITOR) 10 MG tablet Take 1 tablet (10 mg total) by mouth daily. Patient taking differently: Take 10 mg by mouth at bedtime. 05/23/19 04/26/24 Yes Dew, Marlow Baars, MD  Calcium Carbonate-Vitamin D (CALCIUM 600 +D HIGH POTENCY) 600-10 MG-MCG TABS Take 2 tablets by mouth daily.   Yes [provider]  carboxymethylcellulose 1 % ophthalmic solution Place 1 drop into the left eye at bedtime.   Yes [provider]  clopidogrel (PLAVIX) 75 MG tablet Take 75 mg by mouth daily.   Yes [provider]  cyclobenzaprine (FEXMID) 7.5 MG tablet Take 15 mg by mouth at bedtime.   Yes [provider]  escitalopram (LEXAPRO) 5 MG tablet Take 5 mg by mouth daily.   Yes [provider]  ferrous fumarate (FERRETTS) 325 (106 Fe) MG TABS tablet Take 1 tablet by mouth daily.   Yes [provider]  gabapentin (NEURONTIN) 300 MG capsule Take 300 mg by mouth 2 (two) times daily.   Yes [provider]  ibuprofen (ADVIL) 200 MG tablet Take 600 mg by mouth every 12 (twelve) hours.   Yes [provider]  mirtazapine (REMERON) 7.5 MG tablet Take 7.5 mg by mouth at bedtime.   Yes [provider]  Multiple Vitamin (MULTIVITAMIN) tablet Take 1 tablet by mouth daily.   Yes [provider]  oxyCODONE 10 MG TABS Take 1-1.5 tablets (10-15 mg total) by mouth every 4 (four) hours as needed for severe pain or moderate pain. Patient taking differently: Take 10 mg by mouth every 4 (four) hours. 03/06/23  Yes Pennie Banter, DO  oxymetazoline (AFRIN) 0.05 % nasal spray Place 4 sprays into both nostrils every 6 (six) hours as needed (nose bleeds).   Yes [provider]  Polyethyl Glycol-Propyl Glycol (SYSTANE) 0.4-0.3 % SOLN Place 1 drop into both eyes daily.    Yes [provider]  senna-docusate (SENOKOT-S) 8.6-50 MG tablet Take 1 tablet by mouth daily.   Yes [provider]  Sofosbuvir-Velpatasvir (EPCLUSA) 400-100 MG TABS Take 1 tablet by mouth daily. 07/14/23  Yes Odette Fraction, MD  tamsulosin (FLOMAX) 0.4 MG CAPS capsule Take 1 capsule (0.4 mg total) by mouth daily after supper. Patient taking differently: Take 0.4 mg by mouth every  evening. 11/28/21  Yes Earna Coder, MD  zinc oxide 20 % ointment Apply 1 Application topically in the morning and at bedtime. Apply to sacrum   Yes [provider]    Physical Exam: Vitals:   10/01/23 0201 10/01/23 0411 10/01/23 0816 10/01/23 1548  BP:  117/70 127/80 105/70  Pulse:  (!) 111 (!) 112 (!) 117  Resp:  18 18 18   Temp: (!) 100.9 F (38.3 C) 98.4 F (36.9 C) 100.1 F (37.8 C) (!) 101.2 F (38.4 C)  TempSrc: Oral Oral Oral   SpO2:  96% 95% 92%  Weight:      Height:       Constitutional: NAD, calm  Eyes: PERRL, lids and conjunctivae normal ENMT: Mucous membranes are moist. Posterior pharynx clear of any exudate or lesions.  Neck: normal, supple, no masses, no thyromegaly Respiratory: clear to auscultation bilaterally, no wheezing, no crackles. Normal respiratory effort. No accessory muscle use.  Cardiovascular: Regular rate and rhythm, no murmurs / rubs / gallops. No extremity edema. 1+ radial and popliteal pulses.  Abdomen: no tenderness, no masses palpated. No hepatosplenomegaly. Bowel sounds positive x 4 quadrants.  Musculoskeletal: no clubbing / cyanosis. Bilateral BKAs, (R) side covered by dressing. No joint deformity upper and lower extremities. No contractures. Normal muscle tone.  Neurologic: CN 2-12 grossly intact. Alert and oriented x 3.    Data Reviewed:  CBC    Component Value Date/Time   WBC 12.2 (H) 10/01/2023 1642   RBC 2.63 (L) 10/01/2023 1642   HGB 6.8 (L) 10/01/2023 1642   HGB 7.1 (L) 08/14/2023 0924   HCT 21.4 (L) 10/01/2023 1642    PLT 252 10/01/2023 1642   PLT 204 08/14/2023 0924   MCV 81.4 10/01/2023 1642   MCH 25.9 (L) 10/01/2023 1642   MCHC 31.8 10/01/2023 1642   RDW 16.7 (H) 10/01/2023 1642   LYMPHSABS 0.7 10/01/2023 1642   MONOABS 1.3 (H) 10/01/2023 1642   EOSABS 0.0 10/01/2023 1642   BASOSABS 0.0 10/01/2023 1642       Latest Ref Rng & Units 10/01/2023    4:42 PM 10/01/2023    6:00 AM 09/30/2023   11:55 AM  BMP  Glucose 70 - 99 mg/dL 952  841  79   BUN 8 - 23 mg/dL 15  16  24    Creatinine 0.61 - 1.24 mg/dL 3.24  4.01  0.27   Sodium 135 - 145 mmol/L 133  136  137   Potassium 3.5 - 5.1 mmol/L 3.2  3.6  4.1   Chloride 98 - 111 mmol/L 103  105  108   CO2 22 - 32 mmol/L 23  23  22    Calcium 8.9 - 10.3 mg/dL 7.8  8.2  8.5     Lactic Acid, Venous    Component Value Date/Time   LATICACIDVEN 2.6 (HH) 10/01/2023 1709   Procalcitonin    Component Value Date/Time   PROCALCITONIN 0.20 10/01/2023 1642    Thank you very much for involving [place] in the care of your patient.  To reach the provider On-Call:   7AM- 7PM see care teams to locate the attending and reach out to them via [email or web address] Password: [email or web address] 7PM-7AM contact night-coverage If you still have difficulty reaching the appropriate provider, please page the [facility] (Director on Call) for [facility] on amion for assistance  This document was prepared using Conservation officer, historic buildings and may include unintentional dictation errors.  Bishop Limbo FNP-BC, PMHNP-BC Nurse Practitioner [facility]

## 2023-10-01 NOTE — Plan of Care (Signed)

## 2023-10-01 NOTE — Anesthesia Postprocedure Evaluation (Signed)
 Anesthesia Post Note  Patient: Oda Lansdowne  Procedure(s) Performed: AMPUTATION OF AMPUTATION, BELOW KNEE (Right: Knee)  Patient location during evaluation: PACU Anesthesia Type: General Level of consciousness: awake and alert Pain management: pain level controlled Vital Signs Assessment: post-procedure vital signs reviewed and stable Respiratory status: spontaneous breathing, nonlabored ventilation and respiratory function stable Cardiovascular status: blood pressure returned to baseline and stable Postop Assessment: no apparent nausea or vomiting Anesthetic complications: no   No notable events documented.   Last Vitals:  Vitals:   10/01/23 1548 10/01/23 1816  BP: 105/70 98/65  Pulse: (!) 117 (!) 111  Resp: 18 16  Temp: (!) 38.4 C 37.6 C  SpO2: 92% 96%    Last Pain:  Vitals:   10/01/23 1843  TempSrc:   PainSc: 8                  Foye Deer

## 2023-10-01 NOTE — Progress Notes (Signed)
 Patient ostomy changed d/t leaking and pt given bed bath, linen change and gown.

## 2023-10-02 ENCOUNTER — Inpatient Hospital Stay

## 2023-10-02 DIAGNOSIS — L97909 Non-pressure chronic ulcer of unspecified part of unspecified lower leg with unspecified severity: Secondary | ICD-10-CM | POA: Diagnosis not present

## 2023-10-02 DIAGNOSIS — I70299 Other atherosclerosis of native arteries of extremities, unspecified extremity: Secondary | ICD-10-CM | POA: Diagnosis not present

## 2023-10-02 LAB — RESPIRATORY PANEL BY PCR

## 2023-10-02 LAB — SURGICAL PATHOLOGY

## 2023-10-02 LAB — CBC
HCT: 24.3 % — ABNORMAL LOW (ref 39.0–52.0)
Hemoglobin: 8.2 g/dL — ABNORMAL LOW (ref 13.0–17.0)
MCH: 26.6 pg (ref 26.0–34.0)
MCHC: 33.7 g/dL (ref 30.0–36.0)
MCV: 78.9 fL — ABNORMAL LOW (ref 80.0–100.0)
Platelets: 250 10*3/uL (ref 150–400)
RBC: 3.08 MIL/uL — ABNORMAL LOW (ref 4.22–5.81)
RDW: 15.9 % — ABNORMAL HIGH (ref 11.5–15.5)
WBC: 13.8 10*3/uL — ABNORMAL HIGH (ref 4.0–10.5)
nRBC: 0 % (ref 0.0–0.2)

## 2023-10-02 LAB — BASIC METABOLIC PANEL
Anion gap: 8 (ref 5–15)
BUN: 14 mg/dL (ref 8–23)
CO2: 22 mmol/L (ref 22–32)
Calcium: 7.8 mg/dL — ABNORMAL LOW (ref 8.9–10.3)
Chloride: 105 mmol/L (ref 98–111)
Creatinine, Ser: 0.82 mg/dL (ref 0.61–1.24)
GFR, Estimated: >60 mL/min (ref >60)
Glucose, Bld: 131 mg/dL — ABNORMAL HIGH (ref 70–99)
Potassium: 3.5 mmol/L (ref 3.5–5.1)
Sodium: 135 mmol/L (ref 135–145)

## 2023-10-02 LAB — LACTIC ACID, PLASMA: Lactic Acid, Venous: 0.8 mmol/L (ref 0.5–1.9)

## 2023-10-02 LAB — MAGNESIUM: Magnesium: 1.2 mg/dL — ABNORMAL LOW (ref 1.7–2.4)

## 2023-10-02 LAB — PROCALCITONIN: Procalcitonin: 0.37 ng/mL

## 2023-10-02 LAB — FOLATE: Folate: 11 ng/mL (ref >5.9)

## 2023-10-02 LAB — PHOSPHORUS: Phosphorus: 2.4 mg/dL — ABNORMAL LOW (ref 2.5–4.6)

## 2023-10-02 LAB — VITAMIN B12: Vitamin B-12: 129 pg/mL — ABNORMAL LOW (ref 180–914)

## 2023-10-02 MED ORDER — BISACODYL 5 MG PO TBEC
10.0000 mg | DELAYED_RELEASE_TABLET | Freq: Every day | ORAL | Status: DC
  Administered 2023-10-02 – 2023-10-04 (×2): 10 mg via ORAL
  Filled 2023-10-02 (×4): qty 2

## 2023-10-02 MED ORDER — VANCOMYCIN HCL 750 MG/150ML IV SOLN
750.0000 mg | Freq: Two times a day (BID) | INTRAVENOUS | Status: DC
  Administered 2023-10-02 – 2023-10-04 (×4): 750 mg via INTRAVENOUS
  Filled 2023-10-02 (×5): qty 150

## 2023-10-02 MED ORDER — GADOBUTROL 1 MMOL/ML IV SOLN
7.0000 mL | Freq: Once | INTRAVENOUS | Status: AC | PRN
  Administered 2023-10-02: 7 mL via INTRAVENOUS

## 2023-10-02 MED ORDER — K PHOS MONO-SOD PHOS DI & MONO 155-852-130 MG PO TABS
500.0000 mg | ORAL_TABLET | Freq: Three times a day (TID) | ORAL | Status: AC
  Administered 2023-10-02 (×3): 500 mg via ORAL
  Filled 2023-10-02 (×3): qty 2

## 2023-10-02 MED ORDER — BISACODYL 10 MG RE SUPP: 10.0000 mg | Freq: Every day | RECTAL | Status: DC | PRN

## 2023-10-02 MED ORDER — SODIUM CHLORIDE 0.9% FLUSH: 10.0000 mL | INTRAVENOUS | Status: DC | PRN

## 2023-10-02 MED ORDER — POLYETHYLENE GLYCOL 3350 17 G PO PACK
17.0000 g | PACK | Freq: Two times a day (BID) | ORAL | Status: DC
  Administered 2023-10-02 – 2023-10-04 (×3): 17 g via ORAL
  Filled 2023-10-02 (×7): qty 1

## 2023-10-02 MED ORDER — VITAMIN C 500 MG PO TABS
500.0000 mg | ORAL_TABLET | Freq: Two times a day (BID) | ORAL | Status: DC
  Administered 2023-10-02 – 2023-10-06 (×8): 500 mg via ORAL
  Filled 2023-10-02 (×8): qty 1

## 2023-10-02 MED ORDER — CYANOCOBALAMIN 1000 MCG/ML IJ SOLN
1000.0000 ug | Freq: Every day | INTRAMUSCULAR | Status: DC
  Administered 2023-10-02 – 2023-10-06 (×5): 1000 ug via INTRAMUSCULAR
  Filled 2023-10-02 (×5): qty 1

## 2023-10-02 MED ORDER — ADULT MULTIVITAMIN W/MINERALS CH
1.0000 | ORAL_TABLET | Freq: Every day | ORAL | Status: DC
  Administered 2023-10-03 – 2023-10-06 (×4): 1 via ORAL
  Filled 2023-10-02 (×4): qty 1

## 2023-10-02 MED ORDER — SODIUM CHLORIDE 0.9% FLUSH
10.0000 mL | Freq: Two times a day (BID) | INTRAVENOUS | Status: DC
  Administered 2023-10-03 – 2023-10-06 (×6): 10 mL

## 2023-10-02 MED ORDER — VITAMIN B-12 1000 MCG PO TABS: 1000.0000 ug | ORAL_TABLET | Freq: Every day | ORAL | Status: DC

## 2023-10-02 MED ORDER — MAGNESIUM SULFATE 4 GM/100ML IV SOLN
4.0000 g | Freq: Once | INTRAVENOUS | Status: AC
  Administered 2023-10-02: 4 g via INTRAVENOUS
  Filled 2023-10-02: qty 100

## 2023-10-02 MED ORDER — SMOG ENEMA: 960.0000 mL | Freq: Once | RECTAL | Status: DC | PRN

## 2023-10-02 NOTE — Progress Notes (Signed)
       CROSS COVER NOTE  NAME: Chad [name] MRN: [id number] DOB : [birth year] ATTENDING PHYSICIAN: Gilda Crease, Latina Craver, MD    Date of Service   10/02/2023   HPI/Events of Note   Nursing reporting IV out and patient refusal for restart at this time. Patient understands rationale for IV meds and fluids  Interventions   Assessment/Plan:    10/02/2023    3:02 AM 10/02/2023    1:21 AM 10/01/2023    9:36 PM  Vitals with BMI  Systolic 118 105 409  Diastolic 69 65 63  Pulse 108 101 90    Continue to monitor        Donnie Mesa NP [facility] Cross Cover 7pm-7am - check amion for availability Pager [phone]

## 2023-10-02 NOTE — Progress Notes (Signed)
 Patient RUA IV leaking and was out of arm. Patient refuse a new IV insertion at this time. Patient was educated. He reports he was "poke to many times" yesterday. Provider notified no new order at this time.

## 2023-10-02 NOTE — Progress Notes (Signed)
 Initial Nutrition Assessment  DOCUMENTATION CODES:   Severe malnutrition in context of chronic illness  INTERVENTION:   Vital Cuisine po TID, each supplement provides 520kcal and 22g of protein.   Magic cup TID with meals, each supplement provides 290 kcal and 9 grams of protein  MVI po daily   Vitamin C 500mg  po BID  Cyanocobalamin 1000 units po daily   Pt at high refeed risk; recommend monitor potassium, magnesium and phosphorus labs daily until stable  Daily weights  NUTRITION DIAGNOSIS:   Severe Malnutrition related to chronic illness as evidenced by severe fat depletion, severe muscle depletion.  GOAL:   Patient will meet greater than or equal to 90% of their needs  MONITOR:   PO intake, Supplement acceptance, Labs, Weight trends, I & O's, Skin  REASON FOR ASSESSMENT:   Consult Assessment of nutrition requirement/status, Wound healing  ASSESSMENT:   63 y.o. 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  Met with pt in room today. Pt is well known to this RD from a recent previous admission. Pt with poor appetite and oral intake at baseline. Pt does not like Ensure, Boost breeze or ProSource supplements. Pt with poor oral intake in hospital. Pt documented to be eating 0% of meals. RD discussed with pt the importance of adequate nutrition needed to preserve lean muscle and support wound healing. RD will add supplements to meal trays. Pt is at high refeed risk. No new weight since admission; will request daily weights.     Of note, pt with B12 deficiency; will add supplementation   Medications reviewed and  include: Vitamin C, plavix, colace, lovenox, ferrous fumarate, remeron, MVI, Kphos, senokot, vitamin D, vancomycin   Labs reviewed: K 3.5 wnl, P 2.4(L), Mg 1.2(L) B12- 129(L)- 3/7 Wbc- 13.8(H), Hgb 8.2(L), Hct 24.3(L)  NUTRITION - FOCUSED PHYSICAL EXAM:  Flowsheet Row Most Recent Value  Orbital Region Moderate depletion  Upper Arm Region Severe depletion  Thoracic and Lumbar Region Severe depletion  Buccal Region Moderate depletion  Temple Region Moderate depletion  Clavicle Bone Region Severe depletion  Clavicle and Acromion Bone Region Severe depletion  Scapular Bone Region Moderate depletion  Dorsal Hand Severe depletion  Patellar Region Moderate depletion  Anterior Thigh Region Moderate depletion  Posterior Calf Region Unable to assess  Edema (RD Assessment) Mild  Hair Reviewed  Eyes Reviewed  Mouth Reviewed  Skin Reviewed  Nails Reviewed   Diet Order:   Diet Order             Diet regular Room service appropriate? Yes; Fluid consistency: Thin  Diet effective now                  EDUCATION NEEDS:   Education needs have been addressed  Skin:  Skin Assessment: Reviewed RN Assessment (incision R leg)  Last BM:  pta  Height:   Ht Readings from Last 1 Encounters:  09/30/23 5\' 9"  (1.753 m)    Weight:   Wt Readings from Last 1 Encounters:  09/30/23 68 kg    Ideal Body Weight:  64 kg (adjusted for b/l BKAs)  BMI:  Body mass index is 22.15 kg/m.  Estimated Nutritional Needs:  Kcal:  1900-2200kcal/day  Protein:  95-110g/day  Fluid:  1.8-2.1L/day  Betsey Holiday MS, RD, LDN If unable to be reached, please send secure chat to "RD inpatient" available from 8:00a-4:00p daily

## 2023-10-02 NOTE — Progress Notes (Signed)
 Mobility Specialist - Progress Note   10/02/23 1506  Mobility  Activity Refused mobility     Pt sleeping on arrival, awakened by voice. Pt declined mobility despite encouragement; no reason specified. Will attempt another date/time.   Chad [name] Mobility Specialist 10/02/23, 3:06 PM

## 2023-10-02 NOTE — Progress Notes (Signed)
 [facility] Progress Note  Patient: Chad [name]    ZOX:[id number]  DOA: 09/30/2023     Date of Service: the patient was seen and examined on 10/02/2023  Chief complaint: Right foot gangrene  Brief hospital course: Chad [name] is a 64 y.o. male with past medical history of  tobacco abuse, stage IV urethral cancer, history of stage III colon cancer status post hemicolectomy, urothelial cancer, COPD, GERD, hepatitis C, tobacco use disorder, anxiety, depression, CVA, hyperlipidemia, and PVD s/p bilateral BKAs with most recent BKA on (R) 09/30/2023. 3/6 POD#1. TRH is consulted for evaluation of new fever of 38.4C.  Chad [name] denies any subjective systemic symptoms of infection.  He reports he did have a cough and cold the week preceding admission.  Reports cough was not productive and denied nasal and chest congestion.  Specifically denies fatigue, nausea, vomiting, diarrhea, abdominal pain.  Patient is from a facility.   S/p right BKA, covered by surgical dressing that Vascular has not taken down yet and site not directly visualized on exam.    Assessment and Plan: #Sepsis, suspect Osteomyelitis vs Surgical Site Infection SIRS criteria: Temp 38.4, HR 117,  Lactic Acid 2.6--3.0--0.8 trended down with IV fluid - MRI RLE no osteomyelitis, postsurgical changes and fluid collection possible seroma. -Continue vancomycin per pharmacy - IV Fluids - 2L resuscitation followed by 75 mL/hr - Follow Blood cultures - Follow Fever curve -Procalcitonin negative Trend WBC count  #Peripheral Vascular Disease s/p (R) BKA this admission, prior (L) BKA -Continue Plavix per primary team Discontinued aspirin, no need of DAPT  #Postoperative Anemia - Transfuse 1U PRBCs Hb 6.8, improved to 8.2 after PRBC transfusion Monitor H&H  # Hypokalemia, potassium repleted # Hypomagnesemia, mag repleted. # Hypophosphatemia, Phos repleted. Monitor electrolytes and replete as needed.   #Protein Calorie  Malnutrition - Dietitian consulted   #Hyperlipidemia - Continue Home Lipitor   # Vitamin B12 deficiency: Started vitamin B12 1000 mcg IM injection daily during hospital stay, followed by oral supplement.  Follow-up PCP to repeat vitamin B12 level after 3 to 6 months.  #Tobacco Use Disorder - Counseled on cessation - Declined nicotine replacement therapy while inpatient   TRH will continue to follow the patient.   Body mass index is 22.15 kg/m.  Interventions:  Diet: Regular diet DVT Prophylaxis: Subcutaneous Lovenox   Advance goals of care discussion: Full code  Family Communication: family was not present at bedside, at the time of interview.  The pt provided permission to discuss medical plan with the family. Opportunity was given to ask question and all questions were answered satisfactorily.   Disposition:  Pt is from SNF, admitted with right foot gangrene, status post right BKA, still on IV antibiotics, which precludes a safe discharge. Discharge to SNF, when stable and cleared by vascular surgery.  Subjective: No significant events overnight.  Pain in the right leg is under control, patient denied any active issues.  He was resting comfortably.  Physical Exam: General: NAD, lying comfortably Appear in no distress, affect appropriate Eyes: PERRLA ENT: Oral Mucosa Clear, moist  Neck: no JVD,  Cardiovascular: S1 and S2 Present, no Murmur,  Respiratory: good respiratory effort, Bilateral Air entry equal and Decreased, no Crackles, no wheezes Abdomen: Bowel Sound present, Soft and no tenderness,  Skin: no rashes Extremities: s/p right BKA dressing CDI.  S/p prior left BKA  Neurologic: without any new focal findings Gait not checked due to patient safety concerns  Vitals:   10/02/23 0121 10/02/23 0302 10/02/23 0310  10/02/23 0840  BP: 105/65 118/69  113/69  Pulse: (!) 101 (!) 108  (!) 108  Resp: 18 18  18   Temp: (!) 102.2 F (39 C) (!) 102.2 F (39 C) 100.2 F  (37.9 C) 98.3 F (36.8 C)  TempSrc: Oral Axillary Oral   SpO2: 98% 98%  95%  Weight:      Height:        Intake/Output Summary (Last 24 hours) at 10/02/2023 1454 Last data filed at 10/02/2023 1100 Gross per 24 hour  Intake 937.82 ml  Output --  Net 937.82 ml   Filed Weights   09/30/23 1137  Weight: 68 kg    Data Reviewed: I have personally reviewed and interpreted daily labs, tele strips, imagings as discussed above. I reviewed all nursing notes, pharmacy notes, vitals, pertinent old records I have discussed plan of care as described above with RN and patient/family.  CBC: Recent Labs  Lab 09/30/23 1156 10/01/23 0600 10/01/23 1642 10/02/23 0547  WBC 7.2 10.4 12.2* 13.8*  NEUTROABS  --   --  10.2*  --   HGB 8.2* 7.3* 6.8* 8.2*  HCT 27.0* 22.9* 21.4* 24.3*  MCV 82.8 80.9 81.4 78.9*  PLT 289 259 252 250   Basic Metabolic Panel: Recent Labs  Lab 09/30/23 1155 10/01/23 0600 10/01/23 1642 10/02/23 0547  NA 137 136 133* 135  K 4.1 3.6 3.2* 3.5  CL 108 105 103 105  CO2 22 23 23 22   GLUCOSE 79 133* 193* 131*  BUN 24* 16 15 14   CREATININE 0.93 0.85 0.96 0.82  CALCIUM 8.5* 8.2* 7.8* 7.8*  MG  --   --   --  1.2*  PHOS  --   --   --  2.4*    Studies: MR FEMUR RIGHT W WO CONTRAST Result Date: 10/02/2023 CLINICAL DATA:  Soft tissue infection suspected. 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 EXAM: MRI OF THE RIGHT FEMUR WITHOUT AND WITH CONTRAST; MRI OF LOWER RIGHT EXTREMITY WITHOUT AND WITH CONTRAST TECHNIQUE: Multiplanar, multisequence MR imaging of the right thigh was performed both before and after administration of intravenous contrast. Multiplanar, multisequence MR imaging of the right calf was performed both before and after administration of intravenous contrast. CONTRAST:  7mL GADAVIST GADOBUTROL 1 MMOL/ML IV SOLN COMPARISON:  CT chest, abdomen, and pelvis  07/31/2023, PET-CT 08/30/2020 FINDINGS: RIGHT THIGH Bones/Joint/Cartilage Normal right femoral marrow signal.  The cortices are intact. Ligaments No ligament tear is seen. Muscles and Tendons There is mild edema within the posterior hamstring musculature within the proximal and distal aspects of the right thigh. Soft tissues Mild edema within the junction of the posterior right buttock and proximal right thigh subcutaneous fat (axial image 20). Mild-to-moderate subcutaneous fat edema and swelling at the lateral greater than medial aspects of the right knee. -- RIGHT CALF Bones/Joint/Cartilage Postsurgical changes are seen of bony amputation to the level of the proximal right tibial diaphysis and fibular diaphysis. The distal tibia and fibular amputation margins appear sharp. No cortical erosion is seen. No abnormal marrow edema. Note is made the amputation surgery was only 2 days ago. Moderate degenerative changes of the patellofemoral joint. No knee joint effusion. Ligaments The fibular collateral ligament and the medial collateral ligament appear intact. Muscles and Tendons There is edema within the proximal posterior calf musculature. Soft tissues There is metallic susceptibility artifact from surgical staples along the distal medial, distal, and distal lateral aspect  of the right below-the-knee amputation stump. There is diffuse edema throughout the distal amputation stump soft tissues and musculature consistent with recent surgery 2 days ago. Only an 8 mm focus of fluid is seen within the deep aspect of the amputation site, just posterior to the distal remaining tibia (sagittal series 29 image 18, axial series 28, image 42, and coronal series 21, image 18). This may represent a small postoperative seroma. No definite rim enhancing walled-off abscess is seen. IMPRESSION: 1. Postsurgical changes of right below-the-knee amputation to the level of the proximal right tibial diaphysis and fibular diaphysis. No  evidence of osteomyelitis. 2. Diffuse edema throughout the distal amputation stump soft tissues and musculature consistent with recent surgery 2 days ago. Only an 8 mm focus of fluid is seen within the deep aspect of the amputation site, just posterior to the distal remaining tibia. This may represent a small postoperative seroma. No definite rim enhancing walled-off abscess is seen. 3. Mild edema within the posterior hamstring musculature within the proximal and distal aspects of the right thigh. This is nonspecific and may represent muscle strain versus myositis. 4. Mild-to-moderate subcutaneous fat edema and swelling at the lateral greater than medial aspects of the right knee. Electronically Signed   By: Neita Garnet M.D.   On: 10/02/2023 13:39   MR TIBIA FIBULA RIGHT W WO CONTRAST Result Date: 10/02/2023 CLINICAL DATA:  Soft tissue infection suspected. 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 EXAM: MRI OF THE RIGHT FEMUR WITHOUT AND WITH CONTRAST; MRI OF LOWER RIGHT EXTREMITY WITHOUT AND WITH CONTRAST TECHNIQUE: Multiplanar, multisequence MR imaging of the right thigh was performed both before and after administration of intravenous contrast. Multiplanar, multisequence MR imaging of the right calf was performed both before and after administration of intravenous contrast. CONTRAST:  7mL GADAVIST GADOBUTROL 1 MMOL/ML IV SOLN COMPARISON:  CT chest, abdomen, and pelvis 07/31/2023, PET-CT 08/30/2020 FINDINGS: RIGHT THIGH Bones/Joint/Cartilage Normal right femoral marrow signal.  The cortices are intact. Ligaments No ligament tear is seen. Muscles and Tendons There is mild edema within the posterior hamstring musculature within the proximal and distal aspects of the right thigh. Soft tissues Mild edema within the junction of the posterior right buttock and proximal right thigh subcutaneous fat (axial image  20). Mild-to-moderate subcutaneous fat edema and swelling at the lateral greater than medial aspects of the right knee. -- RIGHT CALF Bones/Joint/Cartilage Postsurgical changes are seen of bony amputation to the level of the proximal right tibial diaphysis and fibular diaphysis. The distal tibia and fibular amputation margins appear sharp. No cortical erosion is seen. No abnormal marrow edema. Note is made the amputation surgery was only 2 days ago. Moderate degenerative changes of the patellofemoral joint. No knee joint effusion. Ligaments The fibular collateral ligament and the medial collateral ligament appear intact. Muscles and Tendons There is edema within the proximal posterior calf musculature. Soft tissues There is metallic susceptibility artifact from surgical staples along the distal medial, distal, and distal lateral aspect of the right below-the-knee amputation stump. There is diffuse edema throughout the distal amputation stump soft tissues and musculature consistent with recent surgery 2 days ago. Only an 8 mm focus of fluid is seen within the deep aspect of the amputation site, just posterior to the distal remaining tibia (sagittal series 29 image 18, axial series 28, image 42, and coronal series 21, image 18). This may represent a small postoperative seroma. No definite rim enhancing  walled-off abscess is seen. IMPRESSION: 1. Postsurgical changes of right below-the-knee amputation to the level of the proximal right tibial diaphysis and fibular diaphysis. No evidence of osteomyelitis. 2. Diffuse edema throughout the distal amputation stump soft tissues and musculature consistent with recent surgery 2 days ago. Only an 8 mm focus of fluid is seen within the deep aspect of the amputation site, just posterior to the distal remaining tibia. This may represent a small postoperative seroma. No definite rim enhancing walled-off abscess is seen. 3. Mild edema within the posterior hamstring musculature within  the proximal and distal aspects of the right thigh. This is nonspecific and may represent muscle strain versus myositis. 4. Mild-to-moderate subcutaneous fat edema and swelling at the lateral greater than medial aspects of the right knee. Electronically Signed   By: Neita Garnet M.D.   On: 10/02/2023 13:39   DG Chest Port 1 View Result Date: 10/01/2023 CLINICAL DATA:  Fever EXAM: PORTABLE CHEST 1 VIEW COMPARISON:  04/26/2018 FINDINGS: Pulmonary deflation is normal and symmetric. There is asymmetric reticulonodular pulmonary infiltrate within the lung bases bilaterally which may relate to atypical infection in the appropriate clinical setting. No pneumothorax or pleural effusion. Right internal jugular chest port tip seen at the superior cavoatrial junction. Cardiac size within normal limits. Pulmonary vascularity is normal. No acute bone abnormality. IMPRESSION: 1. Asymmetric reticulonodular pulmonary infiltrate within the lung bases bilaterally which may relate to atypical infection in the appropriate clinical setting. Electronically Signed   By: Helyn Numbers M.D.   On: 10/01/2023 20:33    Scheduled Meds:  vitamin C  500 mg Oral Daily   atorvastatin  10 mg Oral Daily   Chlorhexidine Gluconate Cloth  6 each Topical Daily   clopidogrel  75 mg Oral Daily   docusate sodium  100 mg Oral BID   enoxaparin (LOVENOX) injection  40 mg Subcutaneous Q24H   escitalopram  5 mg Oral Daily   Ferrous Fumarate  106 mg of iron Oral Daily   gabapentin  300 mg Oral BID   mirtazapine  7.5 mg Oral QHS   mupirocin ointment  1 Application Nasal BID   phosphorus  500 mg Oral TID   senna-docusate  1 tablet Oral Daily   sodium chloride flush  10-40 mL Intracatheter Q12H   tamsulosin  0.4 mg Oral QPC supper   Vitamin D (Ergocalciferol)  50,000 Units Oral Q7 days   Continuous Infusions:  sodium chloride 75 mL/hr at 10/02/23 1130   vancomycin 750 mg (10/02/23 1144)   PRN Meds: acetaminophen **OR** acetaminophen,  cyclobenzaprine, HYDROmorphone (DILAUDID) injection, magnesium citrate, ondansetron **OR** ondansetron (ZOFRAN) IV, oxyCODONE, sodium chloride flush, sorbitol  Time spent: 55 minutes  Author: Gillis Santa. MD [facility] 10/02/2023 2:54 PM  To reach On-call, see care teams to locate the attending and reach out to them via [email or web address]. If 7PM-7AM, please contact night-coverage If you still have difficulty reaching the attending provider, please page the [facility] (Director on Call) for [facility] on amion for assistance.

## 2023-10-02 NOTE — Progress Notes (Signed)
 Progress Note    10/02/2023 8:11 AM 2 Days Post-Op  Subjective:  Chad [name] is a 63 yo male now POD #1 from right below the knee amputation. Patient is recovering as expected. Endorses pain to his right leg.  Patient developed fever yesterday afternoon of 101.2.  Complete septic workup was ordered.  Hospitalist was consulted to help assist with the patient's care.  Greatly appreciated for all their input and help.  He was noted to have a hemoglobin of 6.8 postoperatively.  He was given 1 unit of packed red blood cells yesterday without complication.  No complications overnight. Vitals all remain stable.     Vitals:   10/02/23 0302 10/02/23 0310  BP: 118/69   Pulse: (!) 108   Resp: 18   Temp: (!) 102.2 F (39 C) 100.2 F (37.9 C)  SpO2: 98%    Physical Exam: Cardiac:  RRR, Normal S1, S2. No murmurs. Lungs:  Non labored breathing, Rhonchi on auscultation. No rales or wheezing.  Incisions:  Right lower extremity BKA.  Extremities:  Right lower extremity BKA. Dressing clean dry and intact.  Abdomen:  Positive bowel sounds throughout, Soft, non tender and non distended.  Neurologic: AAOX1. Person only. Flat affect.  CBC    Component Value Date/Time   WBC 13.8 (H) 10/02/2023 0547   RBC 3.08 (L) 10/02/2023 0547   HGB 8.2 (L) 10/02/2023 0547   HGB 7.1 (L) 08/14/2023 0924   HCT 24.3 (L) 10/02/2023 0547   PLT 250 10/02/2023 0547   PLT 204 08/14/2023 0924   MCV 78.9 (L) 10/02/2023 0547   MCH 26.6 10/02/2023 0547   MCHC 33.7 10/02/2023 0547   RDW 15.9 (H) 10/02/2023 0547   LYMPHSABS 0.7 10/01/2023 1642   MONOABS 1.3 (H) 10/01/2023 1642   EOSABS 0.0 10/01/2023 1642   BASOSABS 0.0 10/01/2023 1642    BMET    Component Value Date/Time   NA 135 10/02/2023 0547   K 3.5 10/02/2023 0547   CL 105 10/02/2023 0547   CO2 22 10/02/2023 0547   GLUCOSE 131 (H) 10/02/2023 0547   BUN 14 10/02/2023 0547   CREATININE 0.82 10/02/2023 0547   CREATININE 1.09 08/14/2023 0924    CREATININE 1.07 07/01/2023 1359   CALCIUM 7.8 (L) 10/02/2023 0547   GFRNONAA >60 10/02/2023 0547   GFRNONAA >60 08/14/2023 0924   GFRAA >60 04/19/2020 0858    INR    Component Value Date/Time   INR 1.0 07/01/2023 1359     Intake/Output Summary (Last 24 hours) at 10/02/2023 0811 Last data filed at 10/02/2023 0317 Gross per 24 hour  Intake 937.82 ml  Output --  Net 937.82 ml     Assessment/Plan:  63 y.o. male is s/p Right lower extremity BKA  2 Days Post-Op   PLAN Chest x-ray was obtained.  No infection/pneumonia postop atelectasis seen. Blood cultures x 2 ordered still pending. Access patient's port in right chest for vascular access.  No complications to note Fluid resuscitation at 75 mL an hour started for a lactate of 3.0.  Lactate this morning down to 0.8. Patient's white blood cell count this morning is 13.5.  Patient was started on vancomycin IV for elevated WBCs. MRI of the patient's right femur and knee with proximal tibia BKA were obtained.  No noted osteomyelitis on either scan.  MRI did reveal multiple areas of mild edema throughout the distal amputation and soft tissues.  No abscesses were seen. Pain medication as needed Advance diet as tolerated. First dressing  change by vascular surgery on Saturday or Sunday. OOB to the chair during the day. Vascular surgery greatly appreciates hospitalist input in taking care of this patient.  DVT prophylaxis: Plavix 75 mg daily, Lovenox 40 mg SQ every 24 hours.   Marcie [facility] 10/02/2023 8:11 AM

## 2023-10-02 NOTE — Plan of Care (Signed)
 Blood administered per order, no adverse reaction noted and patient tolerated it fair.

## 2023-10-03 DIAGNOSIS — L97909 Non-pressure chronic ulcer of unspecified part of unspecified lower leg with unspecified severity: Secondary | ICD-10-CM | POA: Diagnosis not present

## 2023-10-03 DIAGNOSIS — I70299 Other atherosclerosis of native arteries of extremities, unspecified extremity: Secondary | ICD-10-CM | POA: Diagnosis not present

## 2023-10-03 LAB — CBC
HCT: 21.1 % — ABNORMAL LOW (ref 39.0–52.0)
Hemoglobin: 6.9 g/dL — ABNORMAL LOW (ref 13.0–17.0)
MCH: 26.3 pg (ref 26.0–34.0)
MCHC: 32.7 g/dL (ref 30.0–36.0)
MCV: 80.5 fL (ref 80.0–100.0)
Platelets: 256 10*3/uL (ref 150–400)
RBC: 2.62 MIL/uL — ABNORMAL LOW (ref 4.22–5.81)
RDW: 15.9 % — ABNORMAL HIGH (ref 11.5–15.5)
WBC: 12.1 10*3/uL — ABNORMAL HIGH (ref 4.0–10.5)
nRBC: 0 % (ref 0.0–0.2)

## 2023-10-03 LAB — BASIC METABOLIC PANEL
Anion gap: 5 (ref 5–15)
BUN: 15 mg/dL (ref 8–23)
CO2: 25 mmol/L (ref 22–32)
Calcium: 7.4 mg/dL — ABNORMAL LOW (ref 8.9–10.3)
Chloride: 107 mmol/L (ref 98–111)
Creatinine, Ser: 0.81 mg/dL (ref 0.61–1.24)
GFR, Estimated: >60 mL/min (ref >60)
Glucose, Bld: 104 mg/dL — ABNORMAL HIGH (ref 70–99)
Potassium: 2.6 mmol/L — CL (ref 3.5–5.1)
Sodium: 137 mmol/L (ref 135–145)

## 2023-10-03 LAB — POTASSIUM: Potassium: 3.6 mmol/L (ref 3.5–5.1)

## 2023-10-03 LAB — MAGNESIUM: Magnesium: 2.4 mg/dL (ref 1.7–2.4)

## 2023-10-03 LAB — PHOSPHORUS: Phosphorus: 3.6 mg/dL (ref 2.5–4.6)

## 2023-10-03 MED ORDER — POTASSIUM CHLORIDE CRYS ER 20 MEQ PO TBCR
40.0000 meq | EXTENDED_RELEASE_TABLET | Freq: Once | ORAL | Status: AC
  Administered 2023-10-03: 40 meq via ORAL
  Filled 2023-10-03: qty 2

## 2023-10-03 MED ORDER — POTASSIUM CHLORIDE 10 MEQ/100ML IV SOLN
10.0000 meq | INTRAVENOUS | Status: AC
  Administered 2023-10-03 (×4): 10 meq via INTRAVENOUS
  Filled 2023-10-03 (×4): qty 100

## 2023-10-03 NOTE — Progress Notes (Signed)
 [facility]  - [place] at [facility]   PATIENT NAME: Chad [name]    MR#:  [id number]  DATE OF BIRTH:  19-May-1961  SUBJECTIVE:  no family at bedside. Seen earlier in the day. Eating good breakfast. Denies any complaints. No fever reported    VITALS:  Blood pressure 92/61, pulse 99, temperature 98.5 F (36.9 C), resp. rate 18, height 5\' 9"  (1.753 m), weight 62.4 kg, SpO2 95%.  PHYSICAL EXAMINATION:   GENERAL:  63 y.o.-year-old patient with no acute distress.  LUNGS: Normal breath sounds bilaterally, no wheezing CARDIOVASCULAR: S1, S2 normal. No murmur   ABDOMEN: Soft, nontender, nondistended. Bowel sounds present.  EXTREMITIES: right BKA dressing present. Left BKA stump wrapped NEUROLOGIC: nonfocal  patient is alert and awake SKIN: per RN  LABORATORY PANEL:  CBC Recent Labs  Lab 10/03/23 0259  WBC 12.1*  HGB 6.9*  HCT 21.1*  PLT 256    Chemistries  Recent Labs  Lab 10/01/23 1642 10/02/23 0547 10/03/23 0259  NA 133*   < > 137  K 3.2*   < > 2.6*  CL 103   < > 107  CO2 23   < > 25  GLUCOSE 193*   < > 104*  BUN 15   < > 15  CREATININE 0.96   < > 0.81  CALCIUM 7.8*   < > 7.4*  MG  --    < > 2.4  AST 47*  --   --   ALT 17  --   --   ALKPHOS 90  --   --   BILITOT 0.6  --   --    < > = values in this interval not displayed.   Cardiac Enzymes No results for input(s): "TROPONINI" in the last 168 hours. RADIOLOGY:  MR FEMUR RIGHT W WO CONTRAST Result Date: 10/02/2023 CLINICAL DATA:  Soft tissue infection suspected. 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 EXAM: MRI OF THE RIGHT FEMUR WITHOUT AND WITH CONTRAST; MRI OF LOWER RIGHT EXTREMITY WITHOUT AND WITH CONTRAST TECHNIQUE: Multiplanar, multisequence MR imaging of the right thigh was performed both before and after administration of intravenous contrast. Multiplanar, multisequence MR imaging  of the right calf was performed both before and after administration of intravenous contrast. CONTRAST:  7mL GADAVIST GADOBUTROL 1 MMOL/ML IV SOLN COMPARISON:  CT chest, abdomen, and pelvis 07/31/2023, PET-CT 08/30/2020 FINDINGS: RIGHT THIGH Bones/Joint/Cartilage Normal right femoral marrow signal.  The cortices are intact. Ligaments No ligament tear is seen. Muscles and Tendons There is mild edema within the posterior hamstring musculature within the proximal and distal aspects of the right thigh. Soft tissues Mild edema within the junction of the posterior right buttock and proximal right thigh subcutaneous fat (axial image 20). Mild-to-moderate subcutaneous fat edema and swelling at the lateral greater than medial aspects of the right knee. -- RIGHT CALF Bones/Joint/Cartilage Postsurgical changes are seen of bony amputation to the level of the proximal right tibial diaphysis and fibular diaphysis. The distal tibia and fibular amputation margins appear sharp. No cortical erosion is seen. No abnormal marrow edema. Note is made the amputation surgery was only 2 days ago. Moderate degenerative changes of the patellofemoral joint. No knee joint effusion. Ligaments The fibular collateral ligament and the medial collateral ligament appear intact. Muscles and Tendons There is edema within the proximal posterior calf musculature. Soft tissues There is metallic susceptibility artifact from surgical staples along the distal medial,  distal, and distal lateral aspect of the right below-the-knee amputation stump. There is diffuse edema throughout the distal amputation stump soft tissues and musculature consistent with recent surgery 2 days ago. Only an 8 mm focus of fluid is seen within the deep aspect of the amputation site, just posterior to the distal remaining tibia (sagittal series 29 image 18, axial series 28, image 42, and coronal series 21, image 18). This may represent a small postoperative seroma. No definite rim  enhancing walled-off abscess is seen. IMPRESSION: 1. Postsurgical changes of right below-the-knee amputation to the level of the proximal right tibial diaphysis and fibular diaphysis. No evidence of osteomyelitis. 2. Diffuse edema throughout the distal amputation stump soft tissues and musculature consistent with recent surgery 2 days ago. Only an 8 mm focus of fluid is seen within the deep aspect of the amputation site, just posterior to the distal remaining tibia. This may represent a small postoperative seroma. No definite rim enhancing walled-off abscess is seen. 3. Mild edema within the posterior hamstring musculature within the proximal and distal aspects of the right thigh. This is nonspecific and may represent muscle strain versus myositis. 4. Mild-to-moderate subcutaneous fat edema and swelling at the lateral greater than medial aspects of the right knee. Electronically Signed   By: Neita Garnet M.D.   On: 10/02/2023 13:39   MR TIBIA FIBULA RIGHT W WO CONTRAST Result Date: 10/02/2023 CLINICAL DATA:  Soft tissue infection suspected. 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 EXAM: MRI OF THE RIGHT FEMUR WITHOUT AND WITH CONTRAST; MRI OF LOWER RIGHT EXTREMITY WITHOUT AND WITH CONTRAST TECHNIQUE: Multiplanar, multisequence MR imaging of the right thigh was performed both before and after administration of intravenous contrast. Multiplanar, multisequence MR imaging of the right calf was performed both before and after administration of intravenous contrast. CONTRAST:  7mL GADAVIST GADOBUTROL 1 MMOL/ML IV SOLN COMPARISON:  CT chest, abdomen, and pelvis 07/31/2023, PET-CT 08/30/2020 FINDINGS: RIGHT THIGH Bones/Joint/Cartilage Normal right femoral marrow signal.  The cortices are intact. Ligaments No ligament tear is seen. Muscles and Tendons There is mild edema within the posterior hamstring musculature  within the proximal and distal aspects of the right thigh. Soft tissues Mild edema within the junction of the posterior right buttock and proximal right thigh subcutaneous fat (axial image 20). Mild-to-moderate subcutaneous fat edema and swelling at the lateral greater than medial aspects of the right knee. -- RIGHT CALF Bones/Joint/Cartilage Postsurgical changes are seen of bony amputation to the level of the proximal right tibial diaphysis and fibular diaphysis. The distal tibia and fibular amputation margins appear sharp. No cortical erosion is seen. No abnormal marrow edema. Note is made the amputation surgery was only 2 days ago. Moderate degenerative changes of the patellofemoral joint. No knee joint effusion. Ligaments The fibular collateral ligament and the medial collateral ligament appear intact. Muscles and Tendons There is edema within the proximal posterior calf musculature. Soft tissues There is metallic susceptibility artifact from surgical staples along the distal medial, distal, and distal lateral aspect of the right below-the-knee amputation stump. There is diffuse edema throughout the distal amputation stump soft tissues and musculature consistent with recent surgery 2 days ago. Only an 8 mm focus of fluid is seen within the deep aspect of the amputation site, just posterior to the distal remaining tibia (sagittal series 29 image 18, axial series 28, image 42, and coronal series 21, image 18). This may represent a small postoperative  seroma. No definite rim enhancing walled-off abscess is seen. IMPRESSION: 1. Postsurgical changes of right below-the-knee amputation to the level of the proximal right tibial diaphysis and fibular diaphysis. No evidence of osteomyelitis. 2. Diffuse edema throughout the distal amputation stump soft tissues and musculature consistent with recent surgery 2 days ago. Only an 8 mm focus of fluid is seen within the deep aspect of the amputation site, just posterior to the  distal remaining tibia. This may represent a small postoperative seroma. No definite rim enhancing walled-off abscess is seen. 3. Mild edema within the posterior hamstring musculature within the proximal and distal aspects of the right thigh. This is nonspecific and may represent muscle strain versus myositis. 4. Mild-to-moderate subcutaneous fat edema and swelling at the lateral greater than medial aspects of the right knee. Electronically Signed   By: Neita Garnet M.D.   On: 10/02/2023 13:39   DG Chest Port 1 View Result Date: 10/01/2023 CLINICAL DATA:  Fever EXAM: PORTABLE CHEST 1 VIEW COMPARISON:  04/26/2018 FINDINGS: Pulmonary deflation is normal and symmetric. There is asymmetric reticulonodular pulmonary infiltrate within the lung bases bilaterally which may relate to atypical infection in the appropriate clinical setting. No pneumothorax or pleural effusion. Right internal jugular chest port Chad seen at the superior cavoatrial junction. Cardiac size within normal limits. Pulmonary vascularity is normal. No acute bone abnormality. IMPRESSION: 1. Asymmetric reticulonodular pulmonary infiltrate within the lung bases bilaterally which may relate to atypical infection in the appropriate clinical setting. Electronically Signed   By: Helyn Numbers M.D.   On: 10/01/2023 20:33    Assessment and Plan Chad [name] is a 64 y.o. male with past medical history of tobacco abuse, stage IV urethral cancer, history of stage III colon cancer status post hemicolectomy, urothelial cancer, COPD, GERD, hepatitis C, tobacco use disorder, anxiety, depression, CVA, hyperlipidemia, and PVD s/p bilateral BKAs with most recent BKA on (R) 09/30/2023.    Sepsis/febrile illness postop right BKA SIRS criteria: Temp 38.4, HR 117,  Lactic Acid 2.6--3.0--0.8 trended down with IV fluid -- patient currently afebrile - MRI RLE no osteomyelitis, postsurgical changes and fluid collection possible seroma. -Continue vancomycin per  pharmacy-- blood culture so far negative will narrow down to oral antibiotic from tomorrow if remains afebrile - Follow Blood cultures-- negative - Follow Fever curve-- afebrile -Procalcitonin negative   Peripheral Vascular Disease s/p (R) BKA this admission, prior (L) BKA -Continue Plavix per primary team   Postoperative Anemia - Transfused 1U PRBCs my primary team Hb 6.8, improved to 8.2 after PRBC transfusion -- will check tomorrow    Hypokalemia, potassium replete per pharmacy  Hypomagnesemia, mag repleted.  Hypophosphatemia, Phos repleted. Monitor electrolytes and replete as needed.   Protein Calorie Malnutrition - Dietitian consulted   Hyperlipidemia - Continue Home Lipitor   Vitamin B12 deficiency: Started vitamin B12 1000 mcg IM injection daily during hospital stay, followed by oral supplement.  Follow-up PCP to repeat vitamin B12 level after 3 to 6 months.   #Tobacco Use Disorder - Counseled on cessation - Declined nicotine replacement therapy while inpatient    Procedures: BKA Family communication : none today Level of care: Med-Surg    TOTAL TIME TAKING CARE OF THIS PATIENT: 35 minutes.  >50% time spent on counselling and coordination of care  Note: This dictation was prepared with Dragon dictation along with smaller phrase technology. Any transcriptional errors that result from this process are unintentional.  Enedina Finner M.D    [facility]   CC: Primary care  physician; Everardo Beals, MD

## 2023-10-03 NOTE — Progress Notes (Signed)
       CROSS COVER NOTE  NAME: Chad [name] MRN: [id number] DOB : Oct 18, 1960 ATTENDING PHYSICIAN: Renford Dills, MD    Date of Service   10/03/2023   HPI/Events of Note   K 2.6  Interventions   Assessment/Plan: Mag normal at 2.4 Potassium 40 meQ po and 40 IV Recheck K at 1500       Donnie Mesa NP [facility] Cross Cover 7pm-7am - check amion for availability Pager [phone]

## 2023-10-03 NOTE — Progress Notes (Signed)
    Subjective  - POD #3, s/p right BKA  No complaints, pain controlled   Physical Exam:  Right BKA dressing intact without drainage       Assessment/Plan:  POD #3  Plan for dressing change tomorrow  Chad [name] 10/03/2023 11:11 AM --  Vitals:   10/02/23 2102 10/03/23 0501  BP: 109/63 99/65  Pulse: 88 92  Resp: 16 16  Temp: (!) 100.5 F (38.1 C) 98.9 F (37.2 C)  SpO2: 99% 97%    Intake/Output Summary (Last 24 hours) at 10/03/2023 1111 Last data filed at 10/03/2023 0548 Gross per 24 hour  Intake 557.42 ml  Output 1400 ml  Net -842.58 ml     Laboratory CBC    Component Value Date/Time   WBC 12.1 (H) 10/03/2023 0259   HGB 6.9 (L) 10/03/2023 0259   HGB 7.1 (L) 08/14/2023 0924   HCT 21.1 (L) 10/03/2023 0259   PLT 256 10/03/2023 0259   PLT 204 08/14/2023 0924    BMET    Component Value Date/Time   NA 137 10/03/2023 0259   K 2.6 (LL) 10/03/2023 0259   CL 107 10/03/2023 0259   CO2 25 10/03/2023 0259   GLUCOSE 104 (H) 10/03/2023 0259   BUN 15 10/03/2023 0259   CREATININE 0.81 10/03/2023 0259   CREATININE 1.09 08/14/2023 0924   CREATININE 1.07 07/01/2023 1359   CALCIUM 7.4 (L) 10/03/2023 0259   GFRNONAA >60 10/03/2023 0259   GFRNONAA >60 08/14/2023 0924   GFRAA >60 04/19/2020 0858    COAG Lab Results  Component Value Date   INR 1.0 07/01/2023   INR 1.2 02/26/2023   INR 1.01 06/17/2018   No results found for: "PTT"  Antibiotics Anti-infectives (From admission, onward)    Start     Dose/Rate Route Frequency Ordered Stop   10/02/23 2030  vancomycin (VANCOREADY) IVPB 750 mg/150 mL  Status:  Discontinued        750 mg 150 mL/hr over 60 Minutes Intravenous Every 12 hours 10/01/23 1919 10/02/23 0229   10/02/23 1200  vancomycin (VANCOREADY) IVPB 750 mg/150 mL        750 mg 150 mL/hr over 60 Minutes Intravenous Every 12 hours 10/02/23 0229     10/01/23 2030  vancomycin (VANCOREADY) IVPB 500 mg/100 mL        500 mg 100 mL/hr over 60 Minutes  Intravenous  Once 10/01/23 1919 10/02/23 0234   09/30/23 1145  vancomycin (VANCOCIN) IVPB 1000 mg/200 mL premix        1,000 mg 200 mL/hr over 60 Minutes Intravenous On call to O.R. 09/30/23 1137 09/30/23 1428        V. Charlena Cross, M.D., [facility] [place] Office: [phone] Pager:  [phone]

## 2023-10-04 DIAGNOSIS — I70299 Other atherosclerosis of native arteries of extremities, unspecified extremity: Secondary | ICD-10-CM | POA: Diagnosis not present

## 2023-10-04 DIAGNOSIS — L97909 Non-pressure chronic ulcer of unspecified part of unspecified lower leg with unspecified severity: Secondary | ICD-10-CM | POA: Diagnosis not present

## 2023-10-04 LAB — BASIC METABOLIC PANEL
Anion gap: 6 (ref 5–15)
BUN: 16 mg/dL (ref 8–23)
CO2: 21 mmol/L — ABNORMAL LOW (ref 22–32)
Calcium: 7.5 mg/dL — ABNORMAL LOW (ref 8.9–10.3)
Chloride: 105 mmol/L (ref 98–111)
Creatinine, Ser: 0.6 mg/dL — ABNORMAL LOW (ref 0.61–1.24)
GFR, Estimated: >60 mL/min (ref >60)
Glucose, Bld: 104 mg/dL — ABNORMAL HIGH (ref 70–99)
Potassium: 3.5 mmol/L (ref 3.5–5.1)
Sodium: 132 mmol/L — ABNORMAL LOW (ref 135–145)

## 2023-10-04 LAB — BPAM RBC
Blood Product Expiration Date: 202503152359
Blood Product Expiration Date: 202503182359
Blood Product Expiration Date: 202503212359
ISSUE DATE / TIME: 202503062055
Unit Type and Rh: 5100
Unit Type and Rh: 9500
Unit Type and Rh: 9500

## 2023-10-04 LAB — TYPE AND SCREEN
ABO/RH(D): O NEG
Antibody Screen: POSITIVE
Donor AG Type: NEGATIVE
Unit division: 0
Unit division: 0
Unit division: 0

## 2023-10-04 LAB — CBC
HCT: 23.5 % — ABNORMAL LOW (ref 39.0–52.0)
Hemoglobin: 7.3 g/dL — ABNORMAL LOW (ref 13.0–17.0)
MCH: 25.7 pg — ABNORMAL LOW (ref 26.0–34.0)
MCHC: 31.1 g/dL (ref 30.0–36.0)
MCV: 82.7 fL (ref 80.0–100.0)
Platelets: 280 10*3/uL (ref 150–400)
RBC: 2.84 MIL/uL — ABNORMAL LOW (ref 4.22–5.81)
RDW: 15.9 % — ABNORMAL HIGH (ref 11.5–15.5)
WBC: 11 10*3/uL — ABNORMAL HIGH (ref 4.0–10.5)
nRBC: 0 % (ref 0.0–0.2)

## 2023-10-04 LAB — MAGNESIUM: Magnesium: 1.8 mg/dL (ref 1.7–2.4)

## 2023-10-04 LAB — PHOSPHORUS: Phosphorus: 2.8 mg/dL (ref 2.5–4.6)

## 2023-10-04 MED ORDER — SULFAMETHOXAZOLE-TRIMETHOPRIM 800-160 MG PO TABS
1.0000 | ORAL_TABLET | Freq: Two times a day (BID) | ORAL | Status: AC
  Administered 2023-10-04 – 2023-10-05 (×4): 1 via ORAL
  Filled 2023-10-04 (×4): qty 1

## 2023-10-04 MED ORDER — SULFAMETHOXAZOLE-TRIMETHOPRIM 800-160 MG PO TABS
1.0000 | ORAL_TABLET | Freq: Two times a day (BID) | ORAL | Status: DC
  Filled 2023-10-04: qty 1

## 2023-10-04 NOTE — Progress Notes (Signed)
 [facility]  - [place] at [facility]   PATIENT NAME: Chad [name]    MR#:  [id number]  DATE OF BIRTH:  1960-12-23  SUBJECTIVE:  no family at bedside. Seen earlier in the day. Denies any complaints. No fever reported    VITALS:  Blood pressure 117/77, pulse 85, temperature 98 F (36.7 C), resp. rate 18, height 5\' 9"  (1.753 m), weight 58.8 kg, SpO2 99%.  PHYSICAL EXAMINATION:   GENERAL:  63 y.o.-year-old patient with no acute distress.  LUNGS: Normal breath sounds bilaterally, no wheezing CARDIOVASCULAR: S1, S2 normal. No murmur   ABDOMEN: Soft, nontender, nondistended. Bowel sounds present.  EXTREMITIES: right BKA dressing present. Left BKA stump wrapped NEUROLOGIC: nonfocal  patient is alert and awake SKIN: per RN  LABORATORY PANEL:  CBC Recent Labs  Lab 10/04/23 0554  WBC 11.0*  HGB 7.3*  HCT 23.5*  PLT 280    Chemistries  Recent Labs  Lab 10/01/23 1642 10/02/23 0547 10/04/23 0554  NA 133*   < > 132*  K 3.2*   < > 3.5  CL 103   < > 105  CO2 23   < > 21*  GLUCOSE 193*   < > 104*  BUN 15   < > 16  CREATININE 0.96   < > 0.60*  CALCIUM 7.8*   < > 7.5*  MG  --    < > 1.8  AST 47*  --   --   ALT 17  --   --   ALKPHOS 90  --   --   BILITOT 0.6  --   --    < > = values in this interval not displayed.     Assessment and Plan Chad [name] is a 63 y.o. male with past medical history of tobacco abuse, stage IV urethral cancer, history of stage III colon cancer status post hemicolectomy, urothelial cancer, COPD, GERD, hepatitis C, tobacco use disorder, anxiety, depression, CVA, hyperlipidemia, and PVD s/p bilateral BKAs with most recent BKA on (R) 09/30/2023.    Sepsis/febrile illness postop right BKA SIRS criteria: Temp 38.4, HR 117,  Lactic Acid 2.6--3.0--0.8 trended down with IV fluid -- patient currently afebrile - MRI RLE no osteomyelitis, postsurgical changes and fluid collection possible seroma. -Continue vancomycin per pharmacy--  blood culture so far negative will narrow down to oral antibiotic from tomorrow if remains afebrile - Blood cultures-- negative -  Fever curve-- afebrile -Procalcitonin negative -- discussed with vascular surgery Dr. Myra Gianotti-- will switch to oral for total five days.   Peripheral Vascular Disease s/p (R) BKA this admission, prior (L) BKA -Continue Plavix per primary team   Postoperative Anemia ---Transfused 1U PRBCs by primary team --Hb 6.8, improved to 8.2 after PRBC transfusion -- recheck hemoglobin 7.7   electrolyte abnormality -- pharmacy to replete   Protein Calorie Malnutrition - Dietitian consulted   Hyperlipidemia - Continue Home Lipitor   Vitamin B12 deficiency: Started vitamin B12 1000 mcg IM injection daily during hospital stay, followed by oral supplement.  Follow-up PCP to repeat vitamin B12 level after 3 to 6 months.   Tobacco Use Disorder - Counseled on cessation - Declined nicotine replacement therapy while inpatient  Internal medicine will sign off. Thank you for the consult.     TOTAL TIME TAKING CARE OF THIS PATIENT: 35 minutes.  >50% time spent on counselling and coordination of care  Note: This dictation was prepared with Dragon dictation along with smaller phrase technology. Any transcriptional errors that result  from this process are unintentional.  Enedina Finner M.D    [facility]   CC: Primary care physician; Everardo Beals, MD

## 2023-10-04 NOTE — Progress Notes (Signed)
 Patient declined hygiene throughout shift  would not let me change or reinforce with BKA sites when soiled.  Partial bedding change due to patient declining.  Partial CHG was able to be done with focus around port site, patient is visibly soiled.

## 2023-10-04 NOTE — Progress Notes (Signed)
    Subjective  - POD # 4, status post right BKA  No complaints this morning   Physical Exam:  Dressing changed       Assessment/Plan:  POD # 4  Patient is recovering nicely.  He is eager to get back to his facility.  Appreciate hospital assistance while they were consulting.  Wells Ozell Juhasz 10/04/2023 1:53 PM --  Vitals:   10/04/23 0421 10/04/23 1006  BP: 108/69 117/77  Pulse: 82 85  Resp: 18 18  Temp: 98 F (36.7 C) 98 F (36.7 C)  SpO2: 98% 99%    Intake/Output Summary (Last 24 hours) at 10/04/2023 1353 Last data filed at 10/03/2023 2200 Gross per 24 hour  Intake 360 ml  Output 675 ml  Net -315 ml     Laboratory CBC    Component Value Date/Time   WBC 11.0 (H) 10/04/2023 0554   HGB 7.3 (L) 10/04/2023 0554   HGB 7.1 (L) 08/14/2023 0924   HCT 23.5 (L) 10/04/2023 0554   PLT 280 10/04/2023 0554   PLT 204 08/14/2023 0924    BMET    Component Value Date/Time   NA 132 (L) 10/04/2023 0554   K 3.5 10/04/2023 0554   CL 105 10/04/2023 0554   CO2 21 (L) 10/04/2023 0554   GLUCOSE 104 (H) 10/04/2023 0554   BUN 16 10/04/2023 0554   CREATININE 0.60 (L) 10/04/2023 0554   CREATININE 1.09 08/14/2023 0924   CREATININE 1.07 07/01/2023 1359   CALCIUM 7.5 (L) 10/04/2023 0554   GFRNONAA >60 10/04/2023 0554   GFRNONAA >60 08/14/2023 0924   GFRAA >60 04/19/2020 0858    COAG Lab Results  Component Value Date   INR 1.0 07/01/2023   INR 1.2 02/26/2023   INR 1.01 06/17/2018   No results found for: "PTT"  Antibiotics Anti-infectives (From admission, onward)    Start     Dose/Rate Route Frequency Ordered Stop   10/04/23 1200  sulfamethoxazole-trimethoprim (BACTRIM DS) 800-160 MG per tablet 1 tablet        1 tablet Oral Every 12 hours 10/04/23 0958 10/06/23 1159   10/04/23 1100  sulfamethoxazole-trimethoprim (BACTRIM DS) 800-160 MG per tablet 1 tablet  Status:  Discontinued        1 tablet Oral Every 12 hours 10/04/23 0956 10/04/23 0958   10/02/23 2030   vancomycin (VANCOREADY) IVPB 750 mg/150 mL  Status:  Discontinued        750 mg 150 mL/hr over 60 Minutes Intravenous Every 12 hours 10/01/23 1919 10/02/23 0229   10/02/23 1200  vancomycin (VANCOREADY) IVPB 750 mg/150 mL  Status:  Discontinued        750 mg 150 mL/hr over 60 Minutes Intravenous Every 12 hours 10/02/23 0229 10/04/23 0956   10/01/23 2030  vancomycin (VANCOREADY) IVPB 500 mg/100 mL        500 mg 100 mL/hr over 60 Minutes Intravenous  Once 10/01/23 1919 10/02/23 0234   09/30/23 1145  vancomycin (VANCOCIN) IVPB 1000 mg/200 mL premix        1,000 mg 200 mL/hr over 60 Minutes Intravenous On call to O.R. 09/30/23 1137 09/30/23 1428        V. Charlena Cross, M.D., [facility] [place] Office: [phone] Pager:  [phone]

## 2023-10-05 LAB — BASIC METABOLIC PANEL
Anion gap: 7 (ref 5–15)
BUN: 16 mg/dL (ref 8–23)
CO2: 23 mmol/L (ref 22–32)
Calcium: 7.7 mg/dL — ABNORMAL LOW (ref 8.9–10.3)
Chloride: 103 mmol/L (ref 98–111)
Creatinine, Ser: 0.82 mg/dL (ref 0.61–1.24)
GFR, Estimated: >60 mL/min (ref >60)
Glucose, Bld: 102 mg/dL — ABNORMAL HIGH (ref 70–99)
Potassium: 3.7 mmol/L (ref 3.5–5.1)
Sodium: 133 mmol/L — ABNORMAL LOW (ref 135–145)

## 2023-10-05 LAB — CBC
HCT: 23.8 % — ABNORMAL LOW (ref 39.0–52.0)
Hemoglobin: 7.6 g/dL — ABNORMAL LOW (ref 13.0–17.0)
MCH: 25.8 pg — ABNORMAL LOW (ref 26.0–34.0)
MCHC: 31.9 g/dL (ref 30.0–36.0)
MCV: 80.7 fL (ref 80.0–100.0)
Platelets: 296 10*3/uL (ref 150–400)
RBC: 2.95 MIL/uL — ABNORMAL LOW (ref 4.22–5.81)
RDW: 15.9 % — ABNORMAL HIGH (ref 11.5–15.5)
WBC: 10 10*3/uL (ref 4.0–10.5)
nRBC: 0 % (ref 0.0–0.2)

## 2023-10-05 MED ORDER — CHLORHEXIDINE GLUCONATE CLOTH 2 % EX PADS: 6.0000 | MEDICATED_PAD | Freq: Every day | CUTANEOUS | Status: DC

## 2023-10-05 MED ORDER — TRAZODONE HCL 50 MG PO TABS
25.0000 mg | ORAL_TABLET | Freq: Every evening | ORAL | Status: DC | PRN
  Administered 2023-10-05: 25 mg via ORAL
  Filled 2023-10-05 (×2): qty 1

## 2023-10-05 NOTE — Progress Notes (Signed)
  Progress Note    10/05/2023 12:07 PM 5 Days Post-Op  Subjective:  Chad [name] is a 63 yo male now POD #1 from right below the knee amputation. Patient is recovering as expected. Endorses pain to his right leg.  Dressing changed yesterday by Dr. Myra Gianotti vascular surgery.  Daily dressing changes by nursing going forward.  Instructed the patient to work with physical therapy and Occupational Therapy today for evaluation to get him back to his facility.  Patient verbalizes understanding.  No other complaints.  Vitals all remained stable.   Vitals:   10/05/23 0405 10/05/23 0920  BP: 110/77 113/73  Pulse: 87 93  Resp: 19 18  Temp: 98.5 F (36.9 C)   SpO2: 98% 93%   Physical Exam: Cardiac:  RRR, Normal S1, S2. No murmurs. Lungs:  Non labored breathing, Rhonchi on auscultation. No rales or wheezing.  Incisions:  Right lower extremity BKA.  Extremities:  Right lower extremity BKA. Dressing clean dry and intact.  Abdomen:  Positive bowel sounds throughout, Soft, non tender and non distended.  Neurologic: AAOX1. Person only. Flat affect.  CBC    Component Value Date/Time   WBC 10.0 10/05/2023 0417   RBC 2.95 (L) 10/05/2023 0417   HGB 7.6 (L) 10/05/2023 0417   HGB 7.1 (L) 08/14/2023 0924   HCT 23.8 (L) 10/05/2023 0417   PLT 296 10/05/2023 0417   PLT 204 08/14/2023 0924   MCV 80.7 10/05/2023 0417   MCH 25.8 (L) 10/05/2023 0417   MCHC 31.9 10/05/2023 0417   RDW 15.9 (H) 10/05/2023 0417   LYMPHSABS 0.7 10/01/2023 1642   MONOABS 1.3 (H) 10/01/2023 1642   EOSABS 0.0 10/01/2023 1642   BASOSABS 0.0 10/01/2023 1642    BMET    Component Value Date/Time   NA 133 (L) 10/05/2023 0417   K 3.7 10/05/2023 0417   CL 103 10/05/2023 0417   CO2 23 10/05/2023 0417   GLUCOSE 102 (H) 10/05/2023 0417   BUN 16 10/05/2023 0417   CREATININE 0.82 10/05/2023 0417   CREATININE 1.09 08/14/2023 0924   CREATININE 1.07 07/01/2023 1359   CALCIUM 7.7 (L) 10/05/2023 0417   GFRNONAA >60 10/05/2023  0417   GFRNONAA >60 08/14/2023 0924   GFRAA >60 04/19/2020 0858    INR    Component Value Date/Time   INR 1.0 07/01/2023 1359     Intake/Output Summary (Last 24 hours) at 10/05/2023 1207 Last data filed at 10/05/2023 0405 Gross per 24 hour  Intake 370 ml  Output 150 ml  Net 220 ml     Assessment/Plan:  63 y.o. male is s/p Right BKA 5 Days Post-Op   PLAN PT and OT consults today  Dressing changes been completed.  Daily dressing change going forward by nursing staff. Okay per vascular surgery for patient discharge back to his living facility once PT and OT evaluations are complete. Patient will be discharged on Lipitor 10 mg daily and Plavix 75 mg daily.  DVT prophylaxis: Lovenox 40 mg SQ every 24 hours   Marcie Bal [facility] 10/05/2023 12:07 PM

## 2023-10-05 NOTE — [place] FL2 (Signed)
 [place] MEDICAID FL2 LEVEL OF CARE FORM     IDENTIFICATION  Patient Name: Lawayne [name] Birthdate: [birth year] Sex: male Admission Date (Current Location): 09/30/2023  [facility] and [place] Number:  Chiropodist and Address:         Provider Number: [id number]  Attending Physician Name and Address:  Nada Libman, MD  Relative Name and Phone Number:       Current Level of Care: Hospital Recommended Level of Care: Skilled Nursing Facility Prior Approval Number:    Date Approved/Denied:   PASRR Number: [id number]  Discharge Plan: SNF    Current Diagnoses: Patient Active Problem List   Diagnosis Date Noted   Atherosclerosis of artery of extremity with ulceration (HCC) 09/30/2023   Need for hepatitis B screening test 07/01/2023   Screening for HIV (human immunodeficiency virus) 07/01/2023   Chronic hepatitis C without hepatic coma (HCC) 06/17/2023   Open wound 06/16/2023   Medication management 06/16/2023   Protein-calorie malnutrition (HCC) 06/16/2023   Gangrene of toe (HCC) 02/27/2023   Non-healing wound of left lower extremity 02/26/2023   Tobacco abuse 02/26/2023   Cancer of sigmoid colon (HCC) 11/17/2022   Abnormal CT scan 10/23/2020   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/15/2019   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   Genetic testing 06/15/2019   Swelling of limb 04/15/2019   History of colon cancer 03/24/2019   Family history of prostate cancer    Family history of colon cancer    Family history of leukemia    Family history of kidney cancer    Goals of care, counseling/discussion 12/25/2018   Chronic pain syndrome 11/18/2018   Peripheral vascular disease of lower extremity with ulceration (HCC) 10/04/2018   Neoplasm related pain 09/08/2018   Poor appetite 09/08/2018   Weakness generalized 07/02/2018   Anorexia 07/02/2018   Chronic pain due to neoplasm 07/02/2018   Iron deficiency anemia due to chronic blood  loss 05/27/2018   Ureteral cancer, 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   Palliative care by specialist    Palliative care encounter    Hydronephrosis    Urothelial cancer (HCC)    Dysphagia    Protein-calorie malnutrition, severe 04/26/2018   Severe sepsis (HCC) 04/25/2018   Sepsis (HCC) 04/25/2018    Orientation RESPIRATION BLADDER Height & Weight     Self, Time, Situation, Place  Normal Continent Weight: 63.4 kg Height:  5\' 9"  (175.3 cm)  BEHAVIORAL SYMPTOMS/MOOD NEUROLOGICAL BOWEL NUTRITION STATUS      Colostomy Diet (regular)  AMBULATORY STATUS COMMUNICATION OF NEEDS Skin   Total Care Verbally Bruising, Surgical wounds                       Personal Care Assistance Level of Assistance              Functional Limitations Info             SPECIAL CARE FACTORS FREQUENCY  PT (By licensed PT)                    Contractures Contractures Info: Not present    Additional Factors Info  Code Status, Allergies Code Status Info: Full Allergies Info: Penicillin           Current Medications (10/05/2023):  This is the current hospital active medication list Current Facility-Administered Medications  Medication Dose Route Frequency Provider Last Rate Last Admin   acetaminophen (TYLENOL) tablet 650 mg  650 mg Oral  Q6H PRN Renford Dills, MD   650 mg at 10/01/23 2211   Or   acetaminophen (TYLENOL) suppository 650 mg  650 mg Rectal Q6H PRN Schnier, Latina Craver, MD       ascorbic acid (VITAMIN C) tablet 500 mg  500 mg Oral BID Schnier, Latina Craver, MD   500 mg at 10/05/23 1024   atorvastatin (LIPITOR) tablet 10 mg  10 mg Oral Daily Schnier, Latina Craver, MD   10 mg at 10/05/23 1023   bisacodyl (DULCOLAX) EC tablet 10 mg  10 mg Oral QHS Gillis Santa, MD   10 mg at 10/04/23 2302   bisacodyl (DULCOLAX) suppository 10 mg  10 mg Rectal Daily PRN Gillis Santa, MD       Chlorhexidine Gluconate Cloth 2 % PADS 6 each  6 each Topical Daily Schnier, Latina Craver, MD   6 each at 10/04/23  1835   Chlorhexidine Gluconate Cloth 2 % PADS 6 each  6 each Topical Daily Pace, Huel Cote R, NP       clopidogrel (PLAVIX) tablet 75 mg  75 mg Oral Daily Schnier, Latina Craver, MD   75 mg at 10/05/23 1024   cyanocobalamin (VITAMIN B12) injection 1,000 mcg  1,000 mcg Intramuscular Daily Gillis Santa, MD   1,000 mcg at 10/05/23 1026   Followed by   Melene Muller ON 10/09/2023] cyanocobalamin (VITAMIN B12) tablet 1,000 mcg  1,000 mcg Oral Daily Gillis Santa, MD       cyclobenzaprine (FLEXERIL) tablet 15 mg  15 mg Oral TID PRN Schnier, Latina Craver, MD       enoxaparin (LOVENOX) injection 40 mg  40 mg Subcutaneous Q24H Schnier, Latina Craver, MD   40 mg at 10/05/23 1025   escitalopram (LEXAPRO) tablet 5 mg  5 mg Oral Daily Schnier, Latina Craver, MD   5 mg at 10/05/23 1024   Ferrous Fumarate (HEMOCYTE - 106 mg FE) tablet 106 mg of iron  106 mg of iron Oral Daily Schnier, Latina Craver, MD   106 mg of iron at 10/05/23 1025   gabapentin (NEURONTIN) capsule 300 mg  300 mg Oral BID Schnier, Latina Craver, MD   300 mg at 10/05/23 1024   HYDROmorphone (DILAUDID) injection 0.5-1 mg  0.5-1 mg Intravenous Q2H PRN Schnier, Latina Craver, MD   1 mg at 10/01/23 1603   magnesium citrate solution 1 Bottle  1 Bottle Oral Once PRN Schnier, Latina Craver, MD       mirtazapine (REMERON) tablet 7.5 mg  7.5 mg Oral QHS Schnier, Latina Craver, MD   7.5 mg at 10/04/23 2303   multivitamin with minerals tablet 1 tablet  1 tablet Oral Daily Schnier, Latina Craver, MD   1 tablet at 10/05/23 1025   mupirocin ointment (BACTROBAN) 2 % 1 Application  1 Application Nasal BID Schnier, Latina Craver, MD   1 Application at 10/05/23 1026   ondansetron (ZOFRAN) tablet 4 mg  4 mg Oral Q6H PRN Schnier, Latina Craver, MD       Or   ondansetron [facility]) injection 4 mg  4 mg Intravenous Q6H PRN Schnier, Latina Craver, MD       oxyCODONE (Oxy IR/ROXICODONE) immediate release tablet 10 mg  10 mg Oral Q4H PRN Schnier, Latina Craver, MD   10 mg at 10/05/23 1023   polyethylene glycol (MIRALAX / GLYCOLAX)  packet 17 g  17 g Oral BID Gillis Santa, MD   17 g at 10/04/23 2302   sodium chloride flush (NS) 0.9 % injection 10-40  mL  10-40 mL Intracatheter Q12H Schnier, Latina Craver, MD   10 mL at 10/05/23 1030   sodium chloride flush (NS) 0.9 % injection 10-40 mL  10-40 mL Intracatheter PRN Schnier, Latina Craver, MD       sorbitol 70 % solution 30 mL  30 mL Oral Daily PRN Schnier, Latina Craver, MD       sorbitol, magnesium hydroxide, mineral oil, glycerin (SMOG) enema  960 mL Rectal Once PRN Gillis Santa, MD       sulfamethoxazole-trimethoprim (BACTRIM DS) 800-160 MG per tablet 1 tablet  1 tablet Oral Q12H Meegan, Eryn, RPH   1 tablet at 10/05/23 1419   tamsulosin (FLOMAX) capsule 0.4 mg  0.4 mg Oral QPC supper Renford Dills, MD   0.4 mg at 10/04/23 1738   Vitamin D (Ergocalciferol) (DRISDOL) 1.25 MG (50000 UNIT) capsule 50,000 Units  50,000 Units Oral Q7 days Gillis Santa, MD   50,000 Units at 10/02/23 1141   Facility-Administered Medications Ordered in Other Encounters  Medication Dose Route Frequency Provider Last Rate Last Admin   heparin lock flush 100 UNIT/ML injection              Discharge Medications: Please see discharge summary for a list of discharge medications.  Relevant Imaging Results:  Relevant Lab Results:   Additional Information ss [id number]  Chapman Fitch, RN

## 2023-10-05 NOTE — Progress Notes (Signed)
 Patient refused CHG wipes, pt educated about necessity of daily CHG wipes due to his port, and the risk of infection.

## 2023-10-05 NOTE — Evaluation (Signed)
 Physical Therapy Evaluation Patient Details Name: Chad [name] MRN: [id number] DOB: [birth year] Today's Date: 10/05/2023  History of Present Illness  Patient is a 63 year old male with  history of tobacco abuse, stage IV urethral cancer, history of stage III colon cancer status post hemicolectomy, urothelial cancer, COPD, GERD, hepatitis C, tobacco use disorder, anxiety, depression, CVA, hyperlipidemia, and PVD s/p left BKA. 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   Clinical Impression  PT evaluation completed. Patient is from long term care facility where he requires assistance for pivot transfer to wheelchair at baseline. He is able to navigate independently in an electric wheelchair at the facility.   Today the patient required total assistance +2 person for bed mobility with cues for technique. Increased time and effort required with all mobility. Sitting balance required Max A with posterior lean despite cues for core activation and anterior weight shifting facilitation. Education provided on positioning of residual limb to promote knee ROM. Recommend to continue PT to maximize independence. Rehabilitation < 3 hours/day recommended after this hospital stay.       If plan is discharge home, recommend the following: Two people to help with walking and/or transfers;Two people to help with bathing/dressing/bathroom;Assist for transportation;Help with stairs or ramp for entrance;Assistance with cooking/housework   Can travel by private vehicle   No    Equipment Recommendations None recommended by PT  Recommendations for Other Services       Functional Status Assessment Patient has had a recent decline in their functional status and demonstrates the ability to make significant improvements in function in a reasonable and predictable amount of time.     Precautions / Restrictions Precautions Precautions: Fall Recall of Precautions/Restrictions: Intact Restrictions Weight Bearing Restrictions Per  Provider Order: Yes RLE Weight Bearing Per Provider Order: Non weight bearing      Mobility  Bed Mobility Overal bed mobility: Needs Assistance Bed Mobility: Supine to Sit, Sit to Supine     Supine to sit: Total assist, +2 for physical assistance Sit to supine: Total assist, +2 for physical assistance   General bed mobility comments: verbal cues for technique. assistance for trunk and BLE support using draw pad mostly.    Transfers                   General transfer comment: not attempted    Ambulation/Gait                  Stairs            Wheelchair Mobility     Tilt Bed    Modified Rankin (Stroke Patients Only)       Balance Overall balance assessment: Needs assistance Sitting-balance support: Feet unsupported, Single extremity supported Sitting balance-Leahy Scale: Zero Sitting balance - Comments: posterior lean, maximal assistance required to maintain sitting balance with cues for core activation Postural control: Posterior lean                                   Pertinent Vitals/Pain Pain Assessment Pain Assessment: Faces Faces Pain Scale: Hurts even more Pain Location: residual limb Pain Descriptors / Indicators: Discomfort Pain Intervention(s): Limited activity within patient's tolerance, Monitored during session, Repositioned    Home Living Family/patient expects to be discharged to:: Skilled nursing facility                   Additional Comments: has scooter wheelchair  Prior Function Prior Level of Function : Needs assist             Mobility Comments: assistance of one person for pivot to wheelchair. is Mod I in the wheelchair (has scooter) ADLs Comments: assistance required     Extremity/Trunk Assessment   Upper Extremity Assessment Upper Extremity Assessment:  (history of LUE weakness per patient report)    Lower Extremity Assessment Lower Extremity Assessment: LLE  deficits/detail;RLE deficits/detail;Generalized weakness RLE Deficits / Details: SLR independently. patient unable to straighten the knee fully LLE Deficits / Details: minimal active movement. knee is chroncially flexed per patient report. he prefers for therapist not to touch his legs (unable to formally assess)       Communication   Communication Communication: No apparent difficulties    Cognition Arousal: Alert Behavior During Therapy: WFL for tasks assessed/performed   PT - Cognitive impairments: No family/caregiver present to determine baseline                       PT - Cognition Comments: needs encouragement to participate Following commands: Impaired Following commands impaired: Follows one step commands with increased time     Cueing Cueing Techniques: Verbal cues, Visual cues     General Comments General comments (skin integrity, edema, etc.): encouraged patient to straighten the right knee as able to promote full knee ROM    Exercises     Assessment/Plan    PT Assessment Patient needs continued PT services  PT Problem List Decreased range of motion;Decreased strength;Decreased balance;Decreased activity tolerance;Decreased mobility;Decreased safety awareness;Other (comment);Pain       PT Treatment Interventions DME instruction;Functional mobility training;Therapeutic activities;Therapeutic exercise;Balance training;Neuromuscular re-education;Cognitive remediation;Patient/family education;Wheelchair mobility training    PT Goals (Current goals can be found in the Care Plan section)  Acute Rehab PT Goals Patient Stated Goal: to get balance to be able to sit in the wheelchair PT Goal Formulation: With patient Time For Goal Achievement: 10/19/23 Potential to Achieve Goals: Poor    Frequency Min 1X/week     Co-evaluation PT/OT/SLP Co-Evaluation/Treatment: Yes Reason for Co-Treatment: Complexity of the patient's impairments (multi-system  involvement);For patient/therapist safety;To address functional/ADL transfers PT goals addressed during session: Mobility/safety with mobility         AM-PAC PT "6 Clicks" Mobility  Outcome Measure Help needed turning from your back to your side while in a flat bed without using bedrails?: A Lot Help needed moving from lying on your back to sitting on the side of a flat bed without using bedrails?: Total Help needed moving to and from a bed to a chair (including a wheelchair)?: Total Help needed standing up from a chair using your arms (e.g., wheelchair or bedside chair)?: Total Help needed to walk in hospital room?: Total Help needed climbing 3-5 steps with a railing? : Total 6 Click Score: 7    End of Session   Activity Tolerance: Patient limited by fatigue Patient left: in bed;with call bell/phone within reach;with bed alarm set   PT Visit Diagnosis: Muscle weakness (generalized) (M62.81);Other abnormalities of gait and mobility (R26.89)    Time: 2130-8657 PT Time Calculation (min) (ACUTE ONLY): 12 min   Charges:   PT Evaluation $PT Eval Moderate Complexity: 1 Mod   PT General Charges $$ ACUTE PT VISIT: 1 Visit         Donna Bernard, PT, MPT   Chad [name] 10/05/2023, 2:05 PM

## 2023-10-05 NOTE — Care Management Important Message (Signed)
 Important Message  Patient Details  Name: Chad [name] MRN: [id number] Date of Birth: [birth year]   Important Message Given:  Yes - Medicare IM     Cristela Blue, CMA 10/05/2023, 12:36 PM

## 2023-10-05 NOTE — Evaluation (Signed)
 Occupational Therapy Evaluation Patient Details Name: Chad [name] MRN: [id number] DOB: Feb 11, 1961 Today's Date: 10/05/2023   History of Present Illness   Patient is a 63 year old male with  history of tobacco abuse, stage IV urethral cancer, history of stage III colon cancer status post hemicolectomy, urothelial cancer, COPD, GERD, hepatitis C, tobacco use disorder, anxiety, depression, CVA, hyperlipidemia, and PVD s/p left BKA. 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    Clinical Impressions Chad [name] was seen for OT evaluation this date. Prior to hospital admission, pt was resident at LTC, required +1 assist for w/c t/fs; IND with electric w/c navigation. Pt currently requires TOTAL A x2 bed mobility, MIN A static sitting. Educated on HEP and positioning. Pt would benefit from skilled OT to address noted impairments and functional limitations (see below for any additional details). Upon hospital discharge, recommend OT follow up.    If plan is discharge home, recommend the following:   Two people to help with walking and/or transfers;Two people to help with bathing/dressing/bathroom     Functional Status Assessment   Patient has had a recent decline in their functional status and demonstrates the ability to make significant improvements in function in a reasonable and predictable amount of time.     Equipment Recommendations   Hospital bed     Recommendations for Other Services         Precautions/Restrictions   Precautions Precautions: Fall Recall of Precautions/Restrictions: Intact Restrictions Weight Bearing Restrictions Per Provider Order: Yes RLE Weight Bearing Per Provider Order: Non weight bearing     Mobility Bed Mobility Overal bed mobility: Needs Assistance Bed Mobility: Supine to Sit, Sit to Supine     Supine to sit: Total assist, +2 for physical assistance Sit to supine: Total assist, +2 for physical assistance   General bed mobility comments: verbal  cues for technique. assistance for trunk and BLE support using draw pad mostly.    Transfers                   General transfer comment: not attempted      Balance Overall balance assessment: Needs assistance Sitting-balance support: Feet unsupported, Single extremity supported Sitting balance-Leahy Scale: Zero Sitting balance - Comments: posterior lean, maximal assistance required to maintain sitting balance with cues for core activation Postural control: Posterior lean                                 ADL either performed or assessed with clinical judgement   ADL Overall ADL's : Needs assistance/impaired                                       General ADL Comments: SETUP bed level self-feeding      Pertinent Vitals/Pain Pain Assessment Pain Assessment: Faces Faces Pain Scale: Hurts even more Pain Location: residual limb Pain Descriptors / Indicators: Discomfort Pain Intervention(s): Limited activity within patient's tolerance, Repositioned     Extremity/Trunk Assessment Upper Extremity Assessment Upper Extremity Assessment: Generalized weakness   Lower Extremity Assessment Lower Extremity Assessment: Defer to PT evaluation RLE Deficits / Details: SLR independently. patient unable to straighten the knee fully LLE Deficits / Details: minimal active movement. knee is chroncially flexed per patient report. he prefers for therapist not to touch his legs (unable to formally assess)  Communication Communication Communication: No apparent difficulties   Cognition Arousal: Alert Behavior During Therapy: WFL for tasks assessed/performed Cognition: History of cognitive impairments                               Following commands: Impaired Following commands impaired: Follows one step commands with increased time     Cueing  General Comments   Cueing Techniques: Verbal cues;Visual cues  encouraged patient to  straighten the right knee as able to promote full knee ROM   Exercises     Shoulder Instructions      Home Living Family/patient expects to be discharged to:: Skilled nursing facility                                 Additional Comments: has scooter wheelchair      Prior Functioning/Environment Prior Level of Function : Needs assist             Mobility Comments: assistance of one person for pivot to wheelchair. is Mod I in the wheelchair (has scooter) ADLs Comments: assistance required    OT Problem List: Decreased strength;Decreased range of motion;Decreased activity tolerance;Impaired balance (sitting and/or standing)   OT Treatment/Interventions: Self-care/ADL training;Therapeutic exercise;Energy conservation;DME and/or AE instruction;Therapeutic activities      OT Goals(Current goals can be found in the care plan section)   Acute Rehab OT Goals Patient Stated Goal: to transfer to w/c OT Goal Formulation: With patient Time For Goal Achievement: 10/19/23 Potential to Achieve Goals: Fair ADL Goals Pt Will Perform Grooming: sitting;with min assist Pt Will Perform Lower Body Dressing: with min assist;bed level Pt Will Transfer to Toilet: with max assist;anterior/posterior transfer;bedside commode   OT Frequency:  Min 1X/week    Co-evaluation PT/OT/SLP Co-Evaluation/Treatment: Yes Reason for Co-Treatment: Complexity of the patient's impairments (multi-system involvement);For patient/therapist safety;To address functional/ADL transfers PT goals addressed during session: Mobility/safety with mobility OT goals addressed during session: ADL's and self-care      AM-PAC OT "6 Clicks" Daily Activity     Outcome Measure Help from another person eating meals?: None Help from another person taking care of personal grooming?: A Little Help from another person toileting, which includes using toliet, bedpan, or urinal?: A Lot Help from another person bathing  (including washing, rinsing, drying)?: A Lot Help from another person to put on and taking off regular upper body clothing?: A Little Help from another person to put on and taking off regular lower body clothing?: A Lot 6 Click Score: 16   End of Session    Activity Tolerance: Patient tolerated treatment well Patient left: in bed;with call bell/phone within reach;with bed alarm set  OT Visit Diagnosis: Other abnormalities of gait and mobility (R26.89);Muscle weakness (generalized) (M62.81)                Time: 4098-1191 OT Time Calculation (min): 22 min Charges:  OT General Charges $OT Visit: 1 Visit OT Evaluation $OT Eval Moderate Complexity: 1 Mod  Kathie Dike, M.S. OTR/L  10/05/23, 3:22 PM  ascom [phone]

## 2023-10-05 NOTE — TOC Progression Note (Signed)
 Transition of Care [facility]) - Progression Note    Patient Details  Name: Janzen Sacks MRN: [id number] Date of Birth: 1960/11/21  Transition of [facility]) CM/SW Contact  Chapman Fitch, RN Phone Number: 10/05/2023, 3:13 PM  Clinical Narrative:      Per PA anticipated dc to [facility] tomorrow [facility] at [facility] notified   Expected Discharge Plan: Skilled Nursing Facility Barriers to Discharge: Continued Medical Work up  Expected Discharge Plan and Services     Post Acute Care Choice: Skilled Nursing Facility Living arrangements for the past 2 months: Skilled Nursing Facility                                       Social Determinants of Health (SDOH) Interventions SDOH Screenings   Food Insecurity: No Food Insecurity (09/30/2023)  Housing: Unknown (09/30/2023)  Transportation Needs: No Transportation Needs (09/30/2023)  Utilities: Not At Risk (09/30/2023)  Depression (PHQ2-9): Low Risk  (07/01/2023)  Social Connections: Moderately Integrated (09/30/2023)  Tobacco Use: High Risk (09/30/2023)    Readmission Risk Interventions     No data to display

## 2023-10-06 LAB — CULTURE, BLOOD (ROUTINE X 2)
Culture: NO GROWTH
Culture: NO GROWTH
Special Requests: ADEQUATE

## 2023-10-06 LAB — CBC
HCT: 22.9 % — ABNORMAL LOW (ref 39.0–52.0)
Hemoglobin: 7.2 g/dL — ABNORMAL LOW (ref 13.0–17.0)
MCH: 25.6 pg — ABNORMAL LOW (ref 26.0–34.0)
MCHC: 31.4 g/dL (ref 30.0–36.0)
MCV: 81.5 fL (ref 80.0–100.0)
Platelets: 307 10*3/uL (ref 150–400)
RBC: 2.81 MIL/uL — ABNORMAL LOW (ref 4.22–5.81)
RDW: 16 % — ABNORMAL HIGH (ref 11.5–15.5)
WBC: 9.4 10*3/uL (ref 4.0–10.5)
nRBC: 0 % (ref 0.0–0.2)

## 2023-10-06 LAB — BASIC METABOLIC PANEL
Anion gap: 8 (ref 5–15)
BUN: 16 mg/dL (ref 8–23)
CO2: 21 mmol/L — ABNORMAL LOW (ref 22–32)
Calcium: 7.9 mg/dL — ABNORMAL LOW (ref 8.9–10.3)
Chloride: 99 mmol/L (ref 98–111)
Creatinine, Ser: 0.9 mg/dL (ref 0.61–1.24)
GFR, Estimated: >60 mL/min (ref >60)
Glucose, Bld: 92 mg/dL (ref 70–99)
Potassium: 3.9 mmol/L (ref 3.5–5.1)
Sodium: 128 mmol/L — ABNORMAL LOW (ref 135–145)

## 2023-10-06 MED ORDER — OXYCODONE HCL 10 MG PO TABS: 10.0000 mg | ORAL_TABLET | ORAL | 0 refills | Status: DC | PRN

## 2023-10-06 MED ORDER — HEPARIN SOD (PORK) LOCK FLUSH 100 UNIT/ML IV SOLN
500.0000 [IU] | INTRAVENOUS | Status: AC | PRN
  Administered 2023-10-06: 500 [IU]

## 2023-10-06 NOTE — Discharge Instructions (Signed)
 Do not lift anything heavy do not lift anything more than a gallon of milk.  Not drive for the next 3 weeks  You will need daily dressing changes to your right lower extremity below the knee amputation.  Dressing changes are as follows:  Removal dressing. Cover staple line with Xeroform gauze then Cover with 4 x 4's then Cover with ABD pads then Wrap with Kerlix x 2 then Wrap with 4 inch Ace wrap snugly to decrease swelling.  This dressing needs to be changed daily.  Follow-up with vein and vascular surgery as scheduled for staple removal and wound check.

## 2023-10-06 NOTE — TOC Transition Note (Signed)
 Transition of Care [facility]) - Discharge Note   Patient Details  Name: Chad [name] MRN: [id number] Date of Birth: [birth year]  Transition of Care [facility]) CM/SW Contact:  Chapman Fitch, RN Phone Number: 10/06/2023, 11:49 AM   Clinical Narrative:    Patient will DC to: [facility]  Anticipated DC date: 10/06/23   Family notified:Patient to update family Transport by: Nurse to [facility] when patient is ready   Per MD patient ready for DC to . RN, patient, , and facility notified of DC. Discharge Summary sent to facility. RN given number for report. DC packet on chart.  TOC signing off.      Barriers to Discharge: Continued Medical Work up   Patient Goals and CMS Choice            Discharge Placement                       Discharge Plan and Services Additional resources added to the After Visit Summary for       Post Acute Care Choice: Skilled Nursing Facility                               Social Drivers of Health (SDOH) Interventions SDOH Screenings   Food Insecurity: No Food Insecurity (09/30/2023)  Housing: Unknown (09/30/2023)  Transportation Needs: No Transportation Needs (09/30/2023)  Utilities: Not At Risk (09/30/2023)  Depression (PHQ2-9): Low Risk  (07/01/2023)  Social Connections: Moderately Integrated (09/30/2023)  Tobacco Use: High Risk (09/30/2023)     Readmission Risk Interventions     No data to display

## 2023-10-06 NOTE — Discharge Summary (Addendum)
 [facility]    Discharge Summary    Patient ID:  Chad [name] MRN: [id number] DOB/AGE: Dec 21, 1960 63 y.o.  Admit date: 09/30/2023 Discharge date: 10/06/2023 Date of Surgery: 09/30/2023 Surgeon: Surgeon(s): Schnier, Latina Craver, MD  Admission Diagnosis: Atherosclerosis of artery of extremity with ulceration (HCC) [I70.299, L97.909]  Discharge Diagnoses:  Atherosclerosis of artery of extremity with ulceration (HCC) [I70.299, L97.909]  Secondary Diagnoses: 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    GERD (gastroesophageal reflux disease)    Hepatitis    chronic hep c   Hydronephrosis    Hydronephrosis with ureteral stricture    Hyperlipidemia    Insomnia, unspecified    Knee pain    Left   Major depressive disorder, recurrent, moderate (HCC)    Malignant neoplasm of colon (HCC)    Malnutrition (HCC)    Muscle weakness (generalized)    Nerve pain    Other abnormalities of gait and mobility    Other chronic pain    Peripheral vascular disease (HCC)    Personal history of transient cerebral ischemia    Pressure ulcer    Prostate cancer (HCC)    Stroke (HCC)    Tinea unguium    Tobacco user    Unspecified protein-calorie malnutrition (HCC)    Ureteral cancer, right (HCC)    Urinary frequency    Venous hypertension of both lower extremities     Xerosis cutis     Procedure(s): AMPUTATION OF AMPUTATION, BELOW KNEE  Discharged Condition: good  HPI:  Chad [name] is a 63 year old male who is now status postop day 5 from a 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  She is resting comfortably in bed this morning.  Patient is recovering as expected.  No complaints overnight.  Vitals all remained stable.  Patient is being discharged on aspirin 81 mg daily, Plavix 75 mg daily, and Lipitor 10 mg daily.  Patient was instructed to not miss or skip any of these medications as it will affect the outcome of his surgery.  Patient verbalizes his understanding.  Hospital Course:  Chad [name] is a 63 y.o. male is S/P Right Extubated: POD # 0 Physical Exam:  Alert notes x3, no acute distress Face: Symmetrical.  Tongue is midline. Neck: Trachea is midline.  No swelling or bruising. Cardiovascular: Regular rate and rhythm Pulmonary: Clear to auscultation bilaterally Abdomen: Soft, nontender, nondistended Left lower extremity: Thigh soft.  Calf soft.  Extremities warm distally to the left BKA. Right lower extremity: Thigh soft.  Calf soft.  Extremities warm distally to the right BKA. Neurological: No deficits noted   Post-op wounds:  clean, dry, intact or healing well  Pt. Ambulating, voiding and taking PO diet without difficulty. Pt pain  controlled with PO pain meds.  Labs:  As below  Complications: none  Consults:    Significant Diagnostic Studies: CBC Lab Results  Component Value Date   WBC 9.4 10/06/2023   HGB 7.2 (L) 10/06/2023   HCT 22.9 (L) 10/06/2023   MCV 81.5 10/06/2023   PLT 307 10/06/2023    BMET    Component Value Date/Time   NA 128 (L) 10/06/2023 0400   K 3.9 10/06/2023 0400   CL 99 10/06/2023 0400   CO2 21 (L) 10/06/2023 0400   GLUCOSE 92 10/06/2023 0400   BUN 16 10/06/2023 0400   CREATININE 0.90 10/06/2023 0400   CREATININE 1.09 08/14/2023 0924   CREATININE  1.07 07/01/2023 1359   CALCIUM 7.9 (L) 10/06/2023 0400   GFRNONAA >60 10/06/2023 0400   GFRNONAA >60 08/14/2023 0924   GFRAA >60 04/19/2020 0858   COAG Lab Results  Component Value Date   INR 1.0 07/01/2023   INR 1.2 02/26/2023   INR 1.01 06/17/2018     Disposition:  Discharge to :Skilled nursing facility  Allergies as of 10/06/2023       Reactions   Penicillins Rash        Medication List     TAKE these medications    acetaminophen 325 MG tablet Commonly known as: TYLENOL Take 650 mg by mouth 3 (three) times daily.   ascorbic acid 500 MG tablet Commonly known as: VITAMIN C Take 1 tablet (500 mg total) by mouth 2 (two) times daily.   aspirin EC 81 MG tablet Take 1 tablet (81 mg total) by mouth daily.   atorvastatin 10 MG tablet Commonly known as: Lipitor Take 1 tablet (10 mg total) by mouth daily. What changed: when to take this   Calcium 600 +D High Potency 600-10 MG-MCG Tabs Generic drug: Calcium Carbonate-Vitamin D Take 2 tablets by mouth daily.   carboxymethylcellulose 1 % ophthalmic solution Place 1 drop into the left eye at bedtime.   clopidogrel 75 MG tablet Commonly known as: PLAVIX Take 75 mg by mouth daily.   cyclobenzaprine 7.5 MG tablet Commonly known as: FEXMID Take 15 mg by mouth at bedtime.   escitalopram 5 MG tablet Commonly known as: LEXAPRO Take 5 mg by mouth daily.   Ferretts 325 (106 Fe) MG Tabs tablet Generic drug: ferrous fumarate Take 1 tablet by mouth daily.   gabapentin 300 MG capsule Commonly known as: NEURONTIN Take 300 mg by mouth 2 (two) times daily.   ibuprofen 200 MG tablet Commonly known as: ADVIL Take 600 mg by mouth every 12 (twelve) hours.   mirtazapine 7.5 MG tablet Commonly known as: REMERON Take 7.5 mg by mouth at bedtime.   multivitamin tablet Take 1 tablet by mouth daily.   Oxycodone HCl 10 MG Tabs Take 1-1.5 tablets (10-15 mg total) by mouth every 4 (four) hours as needed for severe pain or  moderate pain. What changed:  how much to take when to take this   oxymetazoline 0.05 % nasal spray Commonly known as: AFRIN Place 4 sprays into both nostrils every 6 (six) hours as needed (nose bleeds).   Pro-Stat Liqd Take 45 mLs by mouth daily.   senna-docusate 8.6-50 MG tablet Commonly known as: Senokot-S Take 1 tablet by mouth daily.   Sofosbuvir-Velpatasvir 400-100 MG Tabs Commonly known as: Epclusa Take 1 tablet by mouth daily.   Systane 0.4-0.3 % Soln Generic drug: Polyethyl Glycol-Propyl Glycol Place 1 drop into both eyes daily.   tamsulosin 0.4 MG Caps capsule  Commonly known as: Flomax Take 1 capsule (0.4 mg total) by mouth daily after supper. What changed: when to take this   zinc oxide 20 % ointment Apply 1 Application topically in the morning and at bedtime. Apply to sacrum       Verbal and written Discharge instructions given to the patient. Wound care per Discharge AVS  Contact information for follow-up providers     Georgiana Spinner, NP Follow up in 3 week(s).   Specialty: Vascular Surgery Why: Staple removal and wound check Contact information: [address] [place] [address] [phone]              Contact information for after-discharge care     Destination     [facility] .   Service: Skilled Nursing Contact information: [address] [place] [address] [phone]                     Signed: Marcie Bal, NP  10/06/2023, 9:38 AM

## 2023-10-06 NOTE — Plan of Care (Signed)
  Problem: Education: Goal: Knowledge of General Education information will improve Description: Including pain rating scale, medication(s)/side effects and non-pharmacologic comfort measures Outcome: Progressing   Problem: Health Behavior/Discharge Planning: Goal: Ability to manage health-related needs will improve Outcome: Progressing   Problem: Activity: Goal: Risk for activity intolerance will decrease Outcome: Progressing   Problem: Coping: Goal: Level of anxiety will decrease Outcome: Progressing   Problem: Pain Managment: Goal: General experience of comfort will improve and/or be controlled Outcome: Progressing   Problem: Safety: Goal: Ability to remain free from injury will improve Outcome: Progressing

## 2023-10-09 ENCOUNTER — Other Ambulatory Visit: Payer: Self-pay

## 2023-10-09 ENCOUNTER — Inpatient Hospital Stay
Admission: EM | Admit: 2023-10-09 | Discharge: 2023-10-16 | DRG: 501 | Disposition: A | Discharge status: Skilled Nursing Facility | Source: Skilled Nursing Facility | Attending: Internal Medicine | Admitting: Internal Medicine

## 2023-10-09 ENCOUNTER — Telehealth (INDEPENDENT_AMBULATORY_CARE_PROVIDER_SITE_OTHER): Payer: Self-pay

## 2023-10-09 ENCOUNTER — Ambulatory Visit: Payer: Self-pay | Admitting: Vascular Surgery

## 2023-10-09 DIAGNOSIS — I70203 Unspecified atherosclerosis of native arteries of extremities, bilateral legs: Secondary | ICD-10-CM | POA: Diagnosis present

## 2023-10-09 DIAGNOSIS — Z720 Tobacco use: Secondary | ICD-10-CM | POA: Diagnosis not present

## 2023-10-09 DIAGNOSIS — K219 Gastro-esophageal reflux disease without esophagitis: Secondary | ICD-10-CM | POA: Diagnosis present

## 2023-10-09 DIAGNOSIS — E785 Hyperlipidemia, unspecified: Secondary | ICD-10-CM | POA: Diagnosis present

## 2023-10-09 DIAGNOSIS — D509 Iron deficiency anemia, unspecified: Secondary | ICD-10-CM | POA: Diagnosis present

## 2023-10-09 DIAGNOSIS — B952 Enterococcus as the cause of diseases classified elsewhere: Secondary | ICD-10-CM | POA: Diagnosis present

## 2023-10-09 DIAGNOSIS — J449 Chronic obstructive pulmonary disease, unspecified: Secondary | ICD-10-CM | POA: Diagnosis present

## 2023-10-09 DIAGNOSIS — M24561 Contracture, right knee: Secondary | ICD-10-CM | POA: Diagnosis not present

## 2023-10-09 DIAGNOSIS — Z89512 Acquired absence of left leg below knee: Secondary | ICD-10-CM

## 2023-10-09 DIAGNOSIS — Z8042 Family history of malignant neoplasm of prostate: Secondary | ICD-10-CM

## 2023-10-09 DIAGNOSIS — F32A Depression, unspecified: Secondary | ICD-10-CM | POA: Diagnosis present

## 2023-10-09 DIAGNOSIS — Z8554 Personal history of malignant neoplasm of ureter: Secondary | ICD-10-CM

## 2023-10-09 DIAGNOSIS — B182 Chronic viral hepatitis C: Secondary | ICD-10-CM | POA: Diagnosis present

## 2023-10-09 DIAGNOSIS — T8781 Dehiscence of amputation stump: Secondary | ICD-10-CM | POA: Diagnosis not present

## 2023-10-09 DIAGNOSIS — Z933 Colostomy status: Secondary | ICD-10-CM | POA: Diagnosis not present

## 2023-10-09 DIAGNOSIS — Z880 Allergy status to penicillin: Secondary | ICD-10-CM

## 2023-10-09 DIAGNOSIS — D500 Iron deficiency anemia secondary to blood loss (chronic): Secondary | ICD-10-CM | POA: Diagnosis not present

## 2023-10-09 DIAGNOSIS — Z9049 Acquired absence of other specified parts of digestive tract: Secondary | ICD-10-CM

## 2023-10-09 DIAGNOSIS — Z5189 Encounter for other specified aftercare: Principal | ICD-10-CM

## 2023-10-09 DIAGNOSIS — F418 Other specified anxiety disorders: Secondary | ICD-10-CM

## 2023-10-09 DIAGNOSIS — T8740 Infection of amputation stump, unspecified extremity: Secondary | ICD-10-CM

## 2023-10-09 DIAGNOSIS — Z8546 Personal history of malignant neoplasm of prostate: Secondary | ICD-10-CM

## 2023-10-09 DIAGNOSIS — Z1621 Resistance to vancomycin: Secondary | ICD-10-CM | POA: Diagnosis present

## 2023-10-09 DIAGNOSIS — L02415 Cutaneous abscess of right lower limb: Secondary | ICD-10-CM | POA: Diagnosis present

## 2023-10-09 DIAGNOSIS — Z8559 Personal history of malignant neoplasm of other urinary tract organ: Secondary | ICD-10-CM

## 2023-10-09 DIAGNOSIS — T148XXA Other injury of unspecified body region, initial encounter: Secondary | ICD-10-CM | POA: Diagnosis not present

## 2023-10-09 DIAGNOSIS — T8753 Necrosis of amputation stump, right lower extremity: Secondary | ICD-10-CM | POA: Diagnosis not present

## 2023-10-09 DIAGNOSIS — Z7982 Long term (current) use of aspirin: Secondary | ICD-10-CM

## 2023-10-09 DIAGNOSIS — E872 Acidosis, unspecified: Secondary | ICD-10-CM | POA: Diagnosis present

## 2023-10-09 DIAGNOSIS — Z1509 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: Secondary | ICD-10-CM

## 2023-10-09 DIAGNOSIS — D62 Acute posthemorrhagic anemia: Secondary | ICD-10-CM | POA: Diagnosis not present

## 2023-10-09 DIAGNOSIS — Z89511 Acquired absence of right leg below knee: Secondary | ICD-10-CM

## 2023-10-09 DIAGNOSIS — L89302 Pressure ulcer of unspecified buttock, stage 2: Secondary | ICD-10-CM | POA: Diagnosis present

## 2023-10-09 DIAGNOSIS — Z66 Do not resuscitate: Secondary | ICD-10-CM | POA: Diagnosis present

## 2023-10-09 DIAGNOSIS — I739 Peripheral vascular disease, unspecified: Secondary | ICD-10-CM | POA: Diagnosis not present

## 2023-10-09 DIAGNOSIS — L89892 Pressure ulcer of other site, stage 2: Secondary | ICD-10-CM | POA: Diagnosis present

## 2023-10-09 DIAGNOSIS — Z8673 Personal history of transient ischemic attack (TIA), and cerebral infarction without residual deficits: Secondary | ICD-10-CM

## 2023-10-09 DIAGNOSIS — T8789 Other complications of amputation stump: Secondary | ICD-10-CM | POA: Diagnosis present

## 2023-10-09 DIAGNOSIS — Z85038 Personal history of other malignant neoplasm of large intestine: Secondary | ICD-10-CM

## 2023-10-09 DIAGNOSIS — T8743 Infection of amputation stump, right lower extremity: Secondary | ICD-10-CM | POA: Diagnosis not present

## 2023-10-09 DIAGNOSIS — L089 Local infection of the skin and subcutaneous tissue, unspecified: Secondary | ICD-10-CM

## 2023-10-09 DIAGNOSIS — I96 Gangrene, not elsewhere classified: Secondary | ICD-10-CM | POA: Diagnosis not present

## 2023-10-09 DIAGNOSIS — D519 Vitamin B12 deficiency anemia, unspecified: Secondary | ICD-10-CM | POA: Diagnosis present

## 2023-10-09 DIAGNOSIS — E871 Hypo-osmolality and hyponatremia: Secondary | ICD-10-CM | POA: Diagnosis present

## 2023-10-09 DIAGNOSIS — Z9889 Other specified postprocedural states: Secondary | ICD-10-CM | POA: Diagnosis not present

## 2023-10-09 DIAGNOSIS — F411 Generalized anxiety disorder: Secondary | ICD-10-CM | POA: Diagnosis present

## 2023-10-09 DIAGNOSIS — Z7902 Long term (current) use of antithrombotics/antiplatelets: Secondary | ICD-10-CM

## 2023-10-09 DIAGNOSIS — B965 Pseudomonas (aeruginosa) (mallei) (pseudomallei) as the cause of diseases classified elsewhere: Secondary | ICD-10-CM | POA: Diagnosis present

## 2023-10-09 DIAGNOSIS — F17210 Nicotine dependence, cigarettes, uncomplicated: Secondary | ICD-10-CM | POA: Diagnosis present

## 2023-10-09 DIAGNOSIS — Z791 Long term (current) use of non-steroidal anti-inflammatories (NSAID): Secondary | ICD-10-CM

## 2023-10-09 DIAGNOSIS — N401 Enlarged prostate with lower urinary tract symptoms: Secondary | ICD-10-CM | POA: Diagnosis present

## 2023-10-09 DIAGNOSIS — N138 Other obstructive and reflux uropathy: Secondary | ICD-10-CM | POA: Diagnosis present

## 2023-10-09 DIAGNOSIS — Y835 Amputation of limb(s) as the cause of abnormal reaction of the patient, or of later complication, without mention of misadventure at the time of the procedure: Secondary | ICD-10-CM | POA: Diagnosis present

## 2023-10-09 DIAGNOSIS — Z8551 Personal history of malignant neoplasm of bladder: Secondary | ICD-10-CM

## 2023-10-09 DIAGNOSIS — G894 Chronic pain syndrome: Secondary | ICD-10-CM | POA: Diagnosis present

## 2023-10-09 DIAGNOSIS — Z79899 Other long term (current) drug therapy: Secondary | ICD-10-CM

## 2023-10-09 DIAGNOSIS — B964 Proteus (mirabilis) (morganii) as the cause of diseases classified elsewhere: Secondary | ICD-10-CM | POA: Diagnosis present

## 2023-10-09 DIAGNOSIS — Z800 Family history of malignant neoplasm of digestive organs: Secondary | ICD-10-CM

## 2023-10-09 LAB — COMPREHENSIVE METABOLIC PANEL
ALT: 25 U/L (ref 0–44)
AST: 29 U/L (ref 15–41)
Albumin: 2.5 g/dL — ABNORMAL LOW (ref 3.5–5.0)
Alkaline Phosphatase: 145 U/L — ABNORMAL HIGH (ref 38–126)
Anion gap: 8 (ref 5–15)
BUN: 31 mg/dL — ABNORMAL HIGH (ref 8–23)
CO2: 18 mmol/L — ABNORMAL LOW (ref 22–32)
Calcium: 8.7 mg/dL — ABNORMAL LOW (ref 8.9–10.3)
Chloride: 105 mmol/L (ref 98–111)
Creatinine, Ser: 1.12 mg/dL (ref 0.61–1.24)
GFR, Estimated: >60 mL/min (ref >60)
Glucose, Bld: 117 mg/dL — ABNORMAL HIGH (ref 70–99)
Potassium: 4.3 mmol/L (ref 3.5–5.1)
Sodium: 131 mmol/L — ABNORMAL LOW (ref 135–145)
Total Bilirubin: 0.6 mg/dL (ref 0.0–1.2)
Total Protein: 7.4 g/dL (ref 6.5–8.1)

## 2023-10-09 LAB — CBC WITH DIFFERENTIAL/PLATELET
Abs Immature Granulocytes: 0.21 10*3/uL — ABNORMAL HIGH (ref 0.00–0.07)
Basophils Absolute: 0.1 10*3/uL (ref 0.0–0.1)
Basophils Relative: 1 %
Eosinophils Absolute: 0.7 10*3/uL — ABNORMAL HIGH (ref 0.0–0.5)
Eosinophils Relative: 8 %
HCT: 27.8 % — ABNORMAL LOW (ref 39.0–52.0)
Hemoglobin: 8.7 g/dL — ABNORMAL LOW (ref 13.0–17.0)
Immature Granulocytes: 3 %
Lymphocytes Relative: 14 %
Lymphs Abs: 1.1 10*3/uL (ref 0.7–4.0)
MCH: 25.7 pg — ABNORMAL LOW (ref 26.0–34.0)
MCHC: 31.3 g/dL (ref 30.0–36.0)
MCV: 82 fL (ref 80.0–100.0)
Monocytes Absolute: 0.6 10*3/uL (ref 0.1–1.0)
Monocytes Relative: 7 %
Neutro Abs: 5.6 10*3/uL (ref 1.7–7.7)
Neutrophils Relative %: 67 %
Platelets: 484 10*3/uL — ABNORMAL HIGH (ref 150–400)
RBC: 3.39 MIL/uL — ABNORMAL LOW (ref 4.22–5.81)
RDW: 16.5 % — ABNORMAL HIGH (ref 11.5–15.5)
WBC: 8.3 10*3/uL (ref 4.0–10.5)
nRBC: 0 % (ref 0.0–0.2)

## 2023-10-09 MED ORDER — ACETAMINOPHEN 325 MG PO TABS: 650.0000 mg | ORAL_TABLET | Freq: Four times a day (QID) | ORAL | Status: DC | PRN

## 2023-10-09 MED ORDER — NICOTINE 21 MG/24HR TD PT24
21.0000 mg | MEDICATED_PATCH | Freq: Every day | TRANSDERMAL | Status: DC
  Filled 2023-10-09 (×6): qty 1

## 2023-10-09 MED ORDER — DM-GUAIFENESIN ER 30-600 MG PO TB12
1.0000 | ORAL_TABLET | Freq: Two times a day (BID) | ORAL | Status: AC | PRN
Start: 2023-10-09 — End: ?

## 2023-10-09 MED ORDER — CHLORHEXIDINE GLUCONATE CLOTH 2 % EX PADS
6.0000 | MEDICATED_PAD | Freq: Every day | CUTANEOUS | Status: DC
  Administered 2023-10-12 – 2023-10-16 (×5): 6 via TOPICAL
  Filled 2023-10-09 (×2): qty 6

## 2023-10-09 MED ORDER — OXYCODONE HCL 5 MG PO TABS
10.0000 mg | ORAL_TABLET | ORAL | Status: DC | PRN
  Administered 2023-10-09 – 2023-10-10 (×2): 10 mg via ORAL
  Administered 2023-10-10 – 2023-10-12 (×9): 15 mg via ORAL
  Administered 2023-10-13 (×2): 10 mg via ORAL
  Administered 2023-10-13: 15 mg via ORAL
  Administered 2023-10-14: 10 mg via ORAL
  Administered 2023-10-14 (×3): 15 mg via ORAL
  Administered 2023-10-15 (×2): 10 mg via ORAL
  Administered 2023-10-15: 15 mg via ORAL
  Administered 2023-10-15 – 2023-10-16 (×2): 10 mg via ORAL
  Filled 2023-10-09 (×3): qty 2
  Filled 2023-10-09 (×4): qty 3
  Filled 2023-10-09: qty 2
  Filled 2023-10-09 (×2): qty 3
  Filled 2023-10-09 (×2): qty 2
  Filled 2023-10-09 (×4): qty 3
  Filled 2023-10-09 (×2): qty 2
  Filled 2023-10-09 (×2): qty 3
  Filled 2023-10-09: qty 2
  Filled 2023-10-09: qty 3

## 2023-10-09 MED ORDER — SODIUM CHLORIDE 0.9 % IV SOLN
2.0000 g | Freq: Once | INTRAVENOUS | Status: AC
  Administered 2023-10-09: 2 g via INTRAVENOUS
  Filled 2023-10-09: qty 12.5

## 2023-10-09 MED ORDER — SODIUM CHLORIDE 0.9% FLUSH
10.0000 mL | Freq: Two times a day (BID) | INTRAVENOUS | Status: DC
  Administered 2023-10-10 – 2023-10-16 (×12): 10 mL

## 2023-10-09 MED ORDER — HEPARIN SODIUM (PORCINE) 5000 UNIT/ML IJ SOLN
5000.0000 [IU] | Freq: Three times a day (TID) | INTRAMUSCULAR | Status: DC
  Administered 2023-10-09 – 2023-10-11 (×5): 5000 [IU] via SUBCUTANEOUS
  Filled 2023-10-09 (×5): qty 1

## 2023-10-09 MED ORDER — SODIUM CHLORIDE 0.9% FLUSH: 10.0000 mL | INTRAVENOUS | Status: DC | PRN

## 2023-10-09 MED ORDER — VANCOMYCIN HCL 1500 MG/300ML IV SOLN
1500.0000 mg | INTRAVENOUS | Status: DC
  Administered 2023-10-10 – 2023-10-11 (×2): 1500 mg via INTRAVENOUS
  Filled 2023-10-09 (×3): qty 300

## 2023-10-09 MED ORDER — MORPHINE SULFATE (PF) 2 MG/ML IV SOLN
2.0000 mg | INTRAVENOUS | Status: DC | PRN
  Administered 2023-10-10 – 2023-10-14 (×7): 2 mg via INTRAVENOUS
  Filled 2023-10-09 (×7): qty 1

## 2023-10-09 MED ORDER — ALBUTEROL SULFATE (2.5 MG/3ML) 0.083% IN NEBU: 2.5000 mg | INHALATION_SOLUTION | RESPIRATORY_TRACT | Status: DC | PRN

## 2023-10-09 MED ORDER — VANCOMYCIN HCL 1500 MG/300ML IV SOLN
1500.0000 mg | Freq: Once | INTRAVENOUS | Status: AC
  Administered 2023-10-09: 1500 mg via INTRAVENOUS
  Filled 2023-10-09: qty 300

## 2023-10-09 MED ORDER — SODIUM CHLORIDE 0.9 % IV SOLN
2.0000 g | Freq: Two times a day (BID) | INTRAVENOUS | Status: DC
  Administered 2023-10-10: 2 g via INTRAVENOUS
  Filled 2023-10-09 (×2): qty 12.5

## 2023-10-09 MED ORDER — ONDANSETRON HCL 4 MG/2ML IJ SOLN: 4.0000 mg | Freq: Three times a day (TID) | INTRAMUSCULAR | Status: DC | PRN

## 2023-10-09 NOTE — Telephone Encounter (Signed)
 Patients right leg looks a little infected - Patient is scheduled to be seen on 10/27/23  I called Chad [name] to get more information. Grover Canavan stated that she wasn't on the floor today. She transferred me 2 times and didn't get a answer. She took a message to call us back.

## 2023-10-09 NOTE — Progress Notes (Signed)
 Received consult for IV access. Noted on review of flowsheet that pt has a documented power injectable implanted port. Recommend port access for this pt.

## 2023-10-09 NOTE — Telephone Encounter (Signed)
 Let's get some additional info such as is how it appears, if the wound is opening or if there is drainage when she calls back

## 2023-10-09 NOTE — ED Notes (Signed)
 Bed alarm, floor mat, fall band, and fall sign applied to room/pt.

## 2023-10-09 NOTE — ED Provider Notes (Signed)
 [facility] Provider Note    Event Date/Time   First MD Initiated Contact with Patient 10/09/23 1925     (approximate)   History   Wound Check   HPI  Chad [name] is a 63 y.o. male with recent BKA presents to the ER for evaluation of purulent drainage from stump of the right lower extremity.  Had surgery on the fifth.  This was working with PT few days ago and several of the staples pulled out.  Has had foul-smelling drainage saturating the gauze.     Physical Exam   Triage Vital Signs: ED Triage Vitals  Encounter Vitals Group     BP      Systolic BP Percentile      Diastolic BP Percentile      Pulse      Resp      Temp      Temp src      SpO2      Weight      Height      Head Circumference      Peak Flow      Pain Score      Pain Loc      Pain Education      Exclude from Growth Chart     Most recent vital signs: Vitals:   10/09/23 1931  BP: 131/84  Pulse: 100  Resp: 16  Temp: 98.3 F (36.8 C)  SpO2: 97%     Constitutional: Alert  Eyes: Conjunctivae are normal.  Head: Atraumatic. Nose: No congestion/rhinnorhea. Mouth/Throat: Mucous membranes are moist.   Neck: Painless ROM.  Cardiovascular:   Good peripheral circulation. Respiratory: Normal respiratory effort.  No retractions.  Gastrointestinal: Soft and nontender.  Musculoskeletal: Bilateral BKA.  Right stump with unhealthy appearing tissue at the incision areas with dehiscence and several missing staples with foul-smelling purulent drainage and surrounding erythema. Neurologic:  MAE spontaneously. No gross focal neurologic deficits are appreciated.  Skin:  Skin is warm, dry and intact. No rash noted. Psychiatric: Mood and affect are normal. Speech and behavior are normal.    ED Results / Procedures / Treatments   Labs (all labs ordered are listed, but only abnormal results are displayed) Labs Reviewed  COMPREHENSIVE METABOLIC PANEL - Abnormal; Notable for the  following components:      Result Value   Sodium 131 (*)    CO2 18 (*)    Glucose, Bld 117 (*)    BUN 31 (*)    Calcium 8.7 (*)    Albumin 2.5 (*)    Alkaline Phosphatase 145 (*)    All other components within normal limits  CBC WITH DIFFERENTIAL/PLATELET - Abnormal; Notable for the following components:   RBC 3.39 (*)    Hemoglobin 8.7 (*)    HCT 27.8 (*)    MCH 25.7 (*)    RDW 16.5 (*)    Platelets 484 (*)    Eosinophils Absolute 0.7 (*)    Abs Immature Granulocytes 0.21 (*)    All other components within normal limits  AEROBIC/ANAEROBIC CULTURE W GRAM STAIN (SURGICAL/DEEP WOUND)  CULTURE, BLOOD (ROUTINE X 2)  CULTURE, BLOOD (ROUTINE X 2)  CBC WITH DIFFERENTIAL/PLATELET     EKG     RADIOLOGY    PROCEDURES:  Critical Care performed: No  Procedures   MEDICATIONS ORDERED IN ED: Medications - No data to display   IMPRESSION / MDM / ASSESSMENT AND PLAN / ED COURSE  I reviewed the triage  vital signs and the nursing notes.                              Differential diagnosis includes, but is not limited to, wound dehiscence, wound infection, synovitis, cellulitis, osteo-  Patient presenting to the ER for evaluation of symptoms as described above.  Based on symptoms, risk factors and considered above differential, this presenting complaint could reflect a potentially life-threatening illness therefore the patient will be placed on continuous pulse oximetry and telemetry for monitoring.  Laboratory evaluation will be sent to evaluate for the above complaints.      Clinical Course as of 10/09/23 2030  Fri Oct 09, 2023  1610 Discussed the case in consultation with Dr. [name] Cullens of vascular surgery.  Given patient's comorbidities patient will be worked up and discussed case in consultation with hospitalist.  Will give IV antibiotics plan will be to keep patient n.p.o. for debridement and operative management tomorrow morning. [PR]    Clinical Course User Index [PR]  Willy Eddy, MD     FINAL CLINICAL IMPRESSION(S) / ED DIAGNOSES   Final diagnoses:  Visit for wound check  Wound infection     Rx / DC Orders   ED Discharge Orders     None        Note:  This document was prepared using Dragon voice recognition software and may include unintentional dictation errors.    Willy Eddy, MD 10/09/23 2030

## 2023-10-09 NOTE — ED Notes (Addendum)
 Attempted IV x2 w/ out success, pt states he's a hard stick due to previous history of IV drug use. Lab draw requested.

## 2023-10-09 NOTE — H&P (Signed)
 History and Physical    Chad [name]:[id number] DOB: 1960/12/31 DOA: 10/09/2023  Referring MD/NP/PA:   PCP: Pcp, No   Patient coming from:  The patient is coming from rehab     Chief Complaint: Wound check  HPI: Chad [name] is a 63 y.o. male with medical history significant of hyperlipidemia, COPD, PVD, depression with anxiety, anemia, chronic pain syndrome, MSH2 Lynch syndrome, multiple cancer (prostate cancer, urethral cancer, colon cancer), s/p of bilateral BKA, who presents with wound infection of right leg stumps.  Pt had R BKA on 3/5 due to infected right foot. The staples in the medial area of stump have popped out. Pt has foul-smelling drainage saturating the gauze.  Patient has a moderate pain, no fever or chills.  He denies chest pain, cough, SOB.  No nausea, vomiting, diarrhea or abdominal pain.  No symptoms of UTI.  Data reviewed independently and ED Course: pt was found to have WBC 8.3, GFR> 60, temperature normal, blood pressure 131/84, heart rate 100, RR 16, oxygen saturation 97% on room air.  Patient is admitted to MedSurg bed as inpatient.  Dr. [name] Cullens of [facility] is consulted.   EKG: I have personally reviewed.  Sinus rhythm, QTc 466, no obvious ischemic change.   Review of Systems:   General: no fevers, chills, no body weight gain, has fatigue HEENT: no blurry vision, hearing changes or sore throat Respiratory: no dyspnea, coughing, wheezing CV: no chest pain, no palpitations GI: no nausea, vomiting, abdominal pain, diarrhea, constipation GU: no dysuria, burning on urination, increased urinary frequency, hematuria  Ext: Has open wound in right BKA stumps.  Has left BKA Neuro: no unilateral weakness, numbness, or tingling, no vision change or hearing loss Skin: no rash MSK: No muscle spasm, no deformity, no limitation of range of movement in spin Heme: No easy bruising.  Travel history: No recent long distant travel.   Allergy:  Allergies  Allergen  Reactions   Penicillins Rash    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    GERD (gastroesophageal reflux disease)    Hepatitis    chronic hep c   Hydronephrosis    Hydronephrosis with ureteral stricture    Hyperlipidemia    Insomnia, unspecified    Knee pain    Left   Major depressive disorder, recurrent, moderate (HCC)    Malignant neoplasm of colon (HCC)    Malnutrition (HCC)    Muscle weakness (generalized)    Nerve pain    Other abnormalities of gait and mobility    Other chronic pain    Peripheral vascular disease (HCC)    Personal history of transient cerebral ischemia    Pressure ulcer    Prostate cancer (HCC)    Stroke (HCC)    Tinea  unguium    Tobacco user    Unspecified protein-calorie malnutrition (HCC)    Ureteral cancer, right (HCC)    Urinary frequency    Venous hypertension of both lower extremities    Xerosis cutis     Past Surgical History:  Procedure Laterality Date   AMPUTATION Left 03/03/2023   Procedure: AMPUTATION BELOW KNEE;  Surgeon: Renford Dills, MD;  Location: [facility];  Service: Vascular;  Laterality: Left;   AMPUTATION Right 09/30/2023   Procedure: AMPUTATION OF AMPUTATION, BELOW KNEE;  Surgeon: Renford Dills, MD;  Location: [facility];  Service: Vascular;  Laterality: Right;   COLON SURGERY     En bloc extended right  hemicolectomy 07/2017   COLONOSCOPY WITH PROPOFOL N/A 11/06/2020   Procedure: COLONOSCOPY WITH PROPOFOL;  Surgeon: Wyline Mood, MD;  Location: [facility];  Service: Gastroenterology;  Laterality: N/A;   COLONOSCOPY WITH PROPOFOL N/A 07/31/2021   Procedure: COLONOSCOPY WITH PROPOFOL;  Surgeon: Earline Mayotte, MD;  Location: [facility] ENDOSCOPY;  Service: Endoscopy;  Laterality: N/A;   CYSTOSCOPY W/ RETROGRADES Right 08/30/2018   Procedure: CYSTOSCOPY WITH RETROGRADE PYELOGRAM;  Surgeon: Vanna Scotland, MD;  Location: [facility];  Service: Urology;  Laterality: Right;   CYSTOSCOPY WITH STENT PLACEMENT Right 04/25/2018   Procedure: CYSTOSCOPY WITH STENT PLACEMENT;  Surgeon: Vanna Scotland, MD;  Location: [facility];  Service: Urology;  Laterality: Right;   CYSTOSCOPY WITH STENT PLACEMENT Right 08/30/2018   Procedure: CYSTOSCOPY WITH STENT Exchange;  Surgeon: Vanna Scotland, MD;  Location: [facility];  Service: Urology;  Laterality: Right;   CYSTOSCOPY WITH STENT PLACEMENT Right 03/07/2019   Procedure: CYSTOSCOPY WITH STENT Exchange;  Surgeon: Vanna Scotland, MD;  Location: [facility];  Service: Urology;  Laterality: Right;   CYSTOSCOPY WITH STENT PLACEMENT Right 11/21/2019   Procedure: CYSTOSCOPY WITH STENT Exchange;  Surgeon: Vanna Scotland, MD;  Location: [facility];  Service: Urology;  Laterality: Right;   LOWER EXTREMITY ANGIOGRAPHY Left 05/23/2019   Procedure: LOWER EXTREMITY ANGIOGRAPHY;  Surgeon: Annice Needy, MD;  Location: [facility];  Service: Cardiovascular;  Laterality: Left;   LOWER EXTREMITY ANGIOGRAPHY Right 05/30/2019   Procedure: LOWER EXTREMITY ANGIOGRAPHY;  Surgeon: Annice Needy, MD;  Location: [facility];  Service: Cardiovascular;  Laterality: Right;   LOWER EXTREMITY ANGIOGRAPHY Right 02/13/2020   Procedure: LOWER EXTREMITY ANGIOGRAPHY;  Surgeon: Annice Needy, MD;  Location: [facility];  Service: Cardiovascular;  Laterality: Right;   LOWER EXTREMITY  ANGIOGRAPHY Left 02/20/2020   Procedure: LOWER EXTREMITY ANGIOGRAPHY;  Surgeon: Annice Needy, MD;  Location: [facility];  Service: Cardiovascular;  Laterality: Left;   LOWER EXTREMITY ANGIOGRAPHY Left 01/01/2023   Procedure: Lower Extremity Angiography;  Surgeon: Annice Needy, MD;  Location: [facility];  Service: Cardiovascular;  Laterality: Left;   PORTA CATH INSERTION N/A 02/28/2019   Procedure: PORTA CATH INSERTION;  Surgeon: Annice Needy, MD;  Location: [facility];  Service: Cardiovascular;  Laterality: N/A;   ROBOT ASSISTED LAPAROSCOPIC PARTIAL COLECTOMY  11/17/2022   tumor removed       Social History:  reports that he has been smoking cigarettes. He has never been exposed to tobacco smoke. He has never used smokeless tobacco. He reports that he does not currently use alcohol. He reports that he does not currently use drugs after having used the following drugs: Marijuana and Methylphenidate.  Family History:  Family History  Problem Relation Age of Onset   Prostate cancer  Father 79   Cancer Brother 54       unsure type   Cancer Paternal Uncle        unsure type   Cancer Maternal Grandmother        unsure type   Cancer Paternal Grandmother        unsure type   Kidney cancer Paternal Grandfather    Cancer Other        unsure types   Leukemia Son    Cancer Son        other cancers, possibly colon     Prior to Admission medications   Medication Sig Start Date End Date Taking? Authorizing Provider  acetaminophen (TYLENOL) 325 MG tablet Take 650 mg by mouth 3 (three) times daily.     [provider]  Amino Acids-Protein Hydrolys (PRO-STAT) LIQD Take 45 mLs by mouth daily.    [provider]  ascorbic acid (VITAMIN C) 500 MG tablet Take 1 tablet (500 mg total) by mouth 2 (two) times daily. 03/06/23   Pennie Banter, DO  aspirin EC 81 MG tablet Take 1 tablet (81 mg total) by mouth daily. 05/23/19   Annice Needy, MD  atorvastatin  (LIPITOR) 10 MG tablet Take 1 tablet (10 mg total) by mouth daily. Patient taking differently: Take 10 mg by mouth at bedtime. 05/23/19 04/26/24  Annice Needy, MD  Calcium Carbonate-Vitamin D (CALCIUM 600 +D HIGH POTENCY) 600-10 MG-MCG TABS Take 2 tablets by mouth daily.    [provider]  carboxymethylcellulose 1 % ophthalmic solution Place 1 drop into the left eye at bedtime.    [provider]  clopidogrel (PLAVIX) 75 MG tablet Take 75 mg by mouth daily.    [provider]  cyclobenzaprine (FEXMID) 7.5 MG tablet Take 15 mg by mouth at bedtime.    [provider]  escitalopram (LEXAPRO) 5 MG tablet Take 5 mg by mouth daily.    [provider]  ferrous fumarate (FERRETTS) 325 (106 Fe) MG TABS tablet Take 1 tablet by mouth daily.    [provider]  gabapentin (NEURONTIN) 300 MG capsule Take 300 mg by mouth 2 (two) times daily.    [provider]  ibuprofen (ADVIL) 200 MG tablet Take 600 mg by mouth every 12 (twelve) hours.    [provider]  mirtazapine (REMERON) 7.5 MG tablet Take 7.5 mg by mouth at bedtime.    [provider]  Multiple Vitamin (MULTIVITAMIN) tablet Take 1 tablet by mouth daily.    [provider]  Oxycodone HCl 10 MG TABS Take 1-1.5 tablets (10-15 mg total) by mouth every 4 (four) hours as needed. 10/06/23   Pace, Peggye Ley, NP  oxymetazoline (AFRIN) 0.05 % nasal spray Place 4 sprays into both nostrils every 6 (six) hours as needed (nose bleeds).    [provider]  Polyethyl Glycol-Propyl Glycol (SYSTANE) 0.4-0.3 % SOLN Place 1 drop into both eyes daily.    [provider]  senna-docusate (SENOKOT-S) 8.6-50 MG tablet Take 1 tablet by mouth daily.    [provider]  Sofosbuvir-Velpatasvir (EPCLUSA) 400-100 MG TABS Take 1 tablet by mouth daily. 07/14/23   Odette Fraction, MD  tamsulosin (FLOMAX) 0.4 MG CAPS capsule Take 1 capsule (0.4 mg total) by mouth daily after  supper. Patient taking differently: Take 0.4 mg by mouth every evening. 11/28/21   Earna Coder, MD  zinc oxide 20 % ointment Apply 1 Application topically in the morning and at  bedtime. Apply to sacrum    [provider]    Physical Exam: Vitals:   10/09/23 2100 10/09/23 2130 10/09/23 2200 10/09/23 2230  BP: 122/79  121/79 119/84  Pulse: (!) 124 99 (!) 101 (!) 105  Resp: 15 13 13 13   Temp:      TempSrc:      SpO2: 96% 100% 100% 99%   General: Not in acute distress HEENT:       Eyes: PERRL, EOMI, no jaundice       ENT: No discharge from the ears and nose, no pharynx injection, no tonsillar enlargement.        Neck: No JVD, no bruit, no mass felt. Heme: No neck lymph node enlargement. Cardiac: S1/S2, RRR, No murmurs, No gallops or rubs. Respiratory: No rales, wheezing, rhonchi or rubs. GI: Soft, nondistended, nontender, no rebound pain, no organomegaly, BS present. GU: No hematuria Ext:  s/p of bilateral BKA. Right BKA stumps has purulent drainage.      Musculoskeletal: No joint deformities, No joint redness or warmth, no limitation of ROM in spin. Skin: No rashes.  Neuro: Alert, oriented X3, cranial nerves II-XII grossly intact, moves all extremities Psych: Patient is not psychotic, no suicidal or hemocidal ideation.  Labs on Admission: I have personally reviewed following labs and imaging studies  CBC: Recent Labs  Lab 10/03/23 0259 10/04/23 0554 10/05/23 0417 10/06/23 0400 10/09/23 2001  WBC 12.1* 11.0* 10.0 9.4 8.3  NEUTROABS  --   --   --   --  5.6  HGB 6.9* 7.3* 7.6* 7.2* 8.7*  HCT 21.1* 23.5* 23.8* 22.9* 27.8*  MCV 80.5 82.7 80.7 81.5 82.0  PLT 256 280 296 307 484*   Basic Metabolic Panel: Recent Labs  Lab 10/03/23 0259 10/03/23 1826 10/04/23 0554 10/05/23 0417 10/06/23 0400 10/09/23 1945  NA 137  --  132* 133* 128* 131*  K 2.6* 3.6 3.5 3.7 3.9 4.3  CL 107  --  105 103 99 105  CO2 25  --  21* 23 21* 18*  GLUCOSE 104*  --  104*  102* 92 117*  BUN 15  --  16 16 16  31*  CREATININE 0.81  --  0.60* 0.82 0.90 1.12  CALCIUM 7.4*  --  7.5* 7.7* 7.9* 8.7*  MG 2.4  --  1.8  --   --   --   PHOS 3.6  --  2.8  --   --   --    GFR: Estimated Creatinine Clearance: 59.1 mL/min (by C-G formula based on SCr of 1.12 mg/dL). Liver Function Tests: Recent Labs  Lab 10/09/23 1945  AST 29  ALT 25  ALKPHOS 145*  BILITOT 0.6  PROT 7.4  ALBUMIN 2.5*   No results for input(s): "LIPASE", "AMYLASE" in the last 168 hours. No results for input(s): "AMMONIA" in the last 168 hours. Coagulation Profile: No results for input(s): "INR", "PROTIME" in the last 168 hours. Cardiac Enzymes: No results for input(s): "CKTOTAL", "CKMB", "CKMBINDEX", "TROPONINI" in the last 168 hours. BNP (last 3 results) No results for input(s): "PROBNP" in the last 8760 hours. HbA1C: No results for input(s): "HGBA1C" in the last 72 hours. CBG: No results for input(s): "GLUCAP" in the last 168 hours. Lipid Profile: No results for input(s): "CHOL", "HDL", "LDLCALC", "TRIG", "CHOLHDL", "LDLDIRECT" in the last 72 hours. Thyroid Function Tests: No results for input(s): "TSH", "T4TOTAL", "FREET4", "T3FREE", "THYROIDAB" in the last 72 hours. Anemia Panel: No results for input(s): "VITAMINB12", "FOLATE", "FERRITIN", "TIBC", "IRON", "  RETICCTPCT" in the last 72 hours. Urine analysis:    Component Value Date/Time   COLORURINE YELLOW (A) 10/01/2023 1632   APPEARANCEUR HAZY (A) 10/01/2023 1632   APPEARANCEUR Cloudy (A) 11/08/2019 1031   LABSPEC 1.016 10/01/2023 1632   PHURINE 5.0 10/01/2023 1632   GLUCOSEU NEGATIVE 10/01/2023 1632   HGBUR LARGE (A) 10/01/2023 1632   BILIRUBINUR NEGATIVE 10/01/2023 1632   BILIRUBINUR Negative 11/08/2019 1031   KETONESUR NEGATIVE 10/01/2023 1632   PROTEINUR NEGATIVE 10/01/2023 1632   NITRITE NEGATIVE 10/01/2023 1632   LEUKOCYTESUR NEGATIVE 10/01/2023 1632   Sepsis Labs: @LABRCNTIP (procalcitonin:4,lacticidven:4) ) Recent  Results (from the past 240 hours)  Surgical pcr screen     Status: Abnormal   Collection Time: 09/30/23 11:38 AM   Specimen: Nasal Mucosa; Nasal Swab  Result Value Ref Range Status   MRSA, PCR POSITIVE (A) NEGATIVE Final    Comment: RESULT CALLED TO, READ BACK BY AND VERIFIED WITH: PATIENCE GRAHAM RN [id number] 09/30/23 HNM    Staphylococcus aureus POSITIVE (A) NEGATIVE Final    Comment: (NOTE) The Xpert SA Assay (FDA approved for NASAL specimens in patients 67 years of age and older), is one component of a comprehensive surveillance program. It is not intended to diagnose infection nor to guide or monitor treatment. Performed at [facility], [address] [place], [place] [address]   Culture, blood (Routine X 2) w Reflex to ID Panel     Status: None   Collection Time: 10/01/23  4:42 PM   Specimen: BLOOD  Result Value Ref Range Status   Specimen Description BLOOD BLOOD RIGHT HAND  Final   Special Requests   Final    BOTTLES DRAWN AEROBIC AND ANAEROBIC Blood Culture results may not be optimal due to an inadequate volume of blood received in culture bottles   Culture   Final    NO GROWTH 5 DAYS Performed at [facility], [address] [place], [place] [address]    Report Status 10/06/2023 FINAL  Final  Culture, blood (Routine X 2) w Reflex to ID Panel     Status: None   Collection Time: 10/01/23  4:42 PM   Specimen: BLOOD  Result Value Ref Range Status   Specimen Description BLOOD BLOOD LEFT HAND  Final   Special Requests   Final    BOTTLES DRAWN AEROBIC AND ANAEROBIC Blood Culture adequate volume   Culture   Final    NO GROWTH 5 DAYS Performed at [facility], [address] [place], [place] [address]    Report Status 10/06/2023 FINAL  Final  Resp panel by RT-PCR (RSV, Flu A&B, Covid) Anterior Nasal Swab     Status: None   Collection Time: 10/01/23  5:16 PM   Specimen: Anterior Nasal Swab  Result Value Ref Range Status   SARS Coronavirus 2 by  RT PCR NEGATIVE NEGATIVE Final    Comment: (NOTE) SARS-CoV-2 target nucleic acids are NOT DETECTED.  The SARS-CoV-2 RNA is generally detectable in upper respiratory specimens during the acute phase of infection. The lowest concentration of SARS-CoV-2 viral copies this assay can detect is 138 copies/mL. A negative result does not preclude SARS-Cov-2 infection and should not be used as the sole basis for treatment or other patient management decisions. A negative result may occur with  improper specimen collection/handling, submission of specimen other than nasopharyngeal swab, presence of viral mutation(s) within the areas targeted by this assay, and inadequate number of viral copies(<138 copies/mL). A negative result must be combined  with clinical observations, patient history, and epidemiological information. The expected result is Negative.  Fact Sheet for Patients:  BloggerCourse.com  Fact Sheet for Healthcare Providers:  SeriousBroker.it  This test is no t yet approved or cleared by the [place] FDA and  has been authorized for detection and/or diagnosis of SARS-CoV-2 by FDA under an Emergency Use Authorization (EUA). This EUA will remain  in effect (meaning this test can be used) for the duration of the COVID-19 declaration under Section 564(b)(1) of the Act, 21 U.S.C.section 360bbb-3(b)(1), unless the authorization is terminated  or revoked sooner.       Influenza A by PCR NEGATIVE NEGATIVE Final   Influenza B by PCR NEGATIVE NEGATIVE Final    Comment: (NOTE) The Xpert Xpress SARS-CoV-2/FLU/RSV plus assay is intended as an aid in the diagnosis of influenza from Nasopharyngeal swab specimens and should not be used as a sole basis for treatment. Nasal washings and aspirates are unacceptable for Xpert Xpress SARS-CoV-2/FLU/RSV testing.  Fact Sheet for Patients: BloggerCourse.com  Fact Sheet  for Healthcare Providers: SeriousBroker.it  This test is not yet approved or cleared by the [place] FDA and has been authorized for detection and/or diagnosis of SARS-CoV-2 by FDA under an Emergency Use Authorization (EUA). This EUA will remain in effect (meaning this test can be used) for the duration of the COVID-19 declaration under Section 564(b)(1) of the Act, 21 U.S.C. section 360bbb-3(b)(1), unless the authorization is terminated or revoked.     Resp Syncytial Virus by PCR NEGATIVE NEGATIVE Final    Comment: (NOTE) Fact Sheet for Patients: BloggerCourse.com  Fact Sheet for Healthcare Providers: SeriousBroker.it  This test is not yet approved or cleared by the [place] FDA and has been authorized for detection and/or diagnosis of SARS-CoV-2 by FDA under an Emergency Use Authorization (EUA). This EUA will remain in effect (meaning this test can be used) for the duration of the COVID-19 declaration under Section 564(b)(1) of the Act, 21 U.S.C. section 360bbb-3(b)(1), unless the authorization is terminated or revoked.  Performed at [facility], [address] [place], [place] [address]   Respiratory (~20 pathogens) panel by PCR     Status: None   Collection Time: 10/02/23 12:00 PM   Specimen: Nasopharyngeal Swab; Respiratory  Result Value Ref Range Status   Adenovirus NOT DETECTED NOT DETECTED Final   Coronavirus 229E NOT DETECTED NOT DETECTED Final    Comment: (NOTE) The Coronavirus on the Respiratory Panel, DOES NOT test for the novel  Coronavirus (2019 nCoV)    Coronavirus HKU1 NOT DETECTED NOT DETECTED Final   Coronavirus NL63 NOT DETECTED NOT DETECTED Final   Coronavirus OC43 NOT DETECTED NOT DETECTED Final   Metapneumovirus NOT DETECTED NOT DETECTED Final   Rhinovirus / Enterovirus NOT DETECTED NOT DETECTED Final   Influenza A NOT DETECTED NOT DETECTED Final    Influenza B NOT DETECTED NOT DETECTED Final   Parainfluenza Virus 1 NOT DETECTED NOT DETECTED Final   Parainfluenza Virus 2 NOT DETECTED NOT DETECTED Final   Parainfluenza Virus 3 NOT DETECTED NOT DETECTED Final   Parainfluenza Virus 4 NOT DETECTED NOT DETECTED Final   Respiratory Syncytial Virus NOT DETECTED NOT DETECTED Final   Bordetella pertussis NOT DETECTED NOT DETECTED Final   Bordetella Parapertussis NOT DETECTED NOT DETECTED Final   Chlamydophila pneumoniae NOT DETECTED NOT DETECTED Final   Mycoplasma pneumoniae NOT DETECTED NOT DETECTED Final    Comment: Performed at [facility], [address] [place], [place] [address]  Radiological Exams on Admission:   Assessment/Plan Principal Problem:   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 Active Problems:   PVD (peripheral vascular disease) (HCC)   HLD (hyperlipidemia)   Chronic hepatitis C without hepatic coma (HCC)   COPD (chronic obstructive pulmonary disease) (HCC)   Iron deficiency anemia due to chronic blood loss   Depression with anxiety   Tobacco abuse   Assessment and Plan:  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: Patient does not have fever or leukocytosis.  Clinically not septic.  Consulted with Dr. [name] Cullens of [facility], who recommended IV antibiotics, we will take patient to the OR in the morning. - will admit to med-surg bed as inpatient - Empiric antimicrobial treatment with vancomycin and cefepime - PRN Zofran for nausea, morphine and Percocet for pain - Blood cultures x 2  - wound care consult - Blood culture - NPO after MN  PVD (peripheral vascular disease) (HCC) -Hold Plavix for surgery -Aspirin, Lipitor  HLD (hyperlipidemia) -Lipitor  Chronic hepatitis C without hepatic coma (HCC): -Continue Epclusa  COPD (chronic obstructive pulmonary disease) (HCC): Stable -Bronchodilators and as needed Mucinex  Iron deficiency anemia due to chronic blood loss: Hemoglobin  stable, 8.7 today (7.2 on 10/06/2023 -Follow up with CBC  Depression with anxiety -Continue home medications  Tobacco abuse -Nicotine patch    DVT ppx: SQ Heparin    Code Status: DNR (I discussed with patient and explained the meaning of CODE STATUS. Patient wants to be DNR)  Family Communication:     not done, no family member is at bed side.     Disposition Plan:  Anticipate discharge back to previous environment, SNF  Consults called:  Dr. [name] Cullens of [facility]  Admission status and Level of care: Med-Surg:  as inpt        Dispo: The patient is from: SNF              Anticipated d/c is to: SNF              Anticipated d/c date is: 2 days              Patient currently is not medically stable to d/c.    Severity of Illness:  The appropriate patient status for this patient is INPATIENT. Inpatient status is judged to be reasonable and necessary in order to provide the required intensity of service to ensure the patient's safety. The patient's presenting symptoms, physical exam findings, and initial radiographic and laboratory data in the context of their chronic comorbidities is felt to place them at high risk for further clinical deterioration. Furthermore, it is not anticipated that the patient will be medically stable for discharge from the hospital within 2 midnights of admission.   * I certify that at the point of admission it is my clinical judgment that the patient will require inpatient hospital care spanning beyond 2 midnights from the point of admission due to high intensity of service, high risk for further deterioration and high frequency of surveillance required.*       Date of Service 10/10/2023    Lorretta Harp [facility]   If 7PM-7AM, please contact night-coverage [email or web address] 10/10/2023, 1:28 AM

## 2023-10-09 NOTE — Consult Note (Signed)
 [facility] Vascular Consult Note  MRN : [id number]  Chad [name] is a 63 y.o. (1961-04-27) male who presents with chief complaint of  Chief Complaint  Patient presents with   Wound Check  .  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  He was at rehab and the staples in the medial area popped out and copious drainage has been present since.  No fever.  Mild odor to drainage with is mucoid and non-serous.  No significant redness and no report of fever or chills.  No current facility-administered medications for this encounter.   Current Outpatient Medications  Medication Sig Dispense Refill   acetaminophen (TYLENOL) 325 MG tablet Take 650 mg by mouth 3 (three) times daily.      Amino Acids-Protein Hydrolys (PRO-STAT) LIQD Take 45 mLs by mouth daily.     ascorbic acid (VITAMIN C) 500 MG tablet Take 1 tablet (500 mg total) by mouth 2 (two) times daily.     aspirin EC 81 MG tablet Take 1 tablet (81 mg total) by mouth daily. 150 tablet 2   atorvastatin (LIPITOR) 10 MG tablet Take 1 tablet (10 mg total) by mouth daily. (Patient taking differently: Take 10 mg by mouth at bedtime.) 30 tablet 11   Calcium Carbonate-Vitamin D (CALCIUM 600 +D HIGH POTENCY) 600-10 MG-MCG TABS Take 2 tablets by mouth daily.     carboxymethylcellulose 1 % ophthalmic solution Place 1 drop into the left eye at bedtime.     clopidogrel (PLAVIX) 75 MG tablet Take 75 mg by mouth daily.     cyclobenzaprine (FEXMID) 7.5 MG tablet Take 15 mg by mouth at bedtime.     escitalopram (LEXAPRO) 5 MG tablet Take 5 mg by mouth daily.     ferrous fumarate (FERRETTS) 325 (106 Fe) MG TABS tablet Take 1 tablet by mouth daily.     gabapentin (NEURONTIN) 300 MG capsule Take 300 mg by mouth 2 (two) times daily.     ibuprofen (ADVIL) 200 MG tablet Take 600 mg by mouth every 12 (twelve) hours.     mirtazapine (REMERON) 7.5 MG tablet Take 7.5 mg by mouth at bedtime.      Multiple Vitamin (MULTIVITAMIN) tablet Take 1 tablet by mouth daily.     Oxycodone HCl 10 MG TABS Take 1-1.5 tablets (10-15 mg total) by mouth every 4 (four) hours as needed. 30 tablet 0   oxymetazoline (AFRIN) 0.05 % nasal spray Place 4 sprays into both nostrils every 6 (six) hours as needed (nose bleeds).     Polyethyl Glycol-Propyl Glycol (SYSTANE) 0.4-0.3 % SOLN Place 1 drop into both eyes daily.     senna-docusate (SENOKOT-S) 8.6-50 MG tablet Take 1 tablet by mouth daily.     Sofosbuvir-Velpatasvir (EPCLUSA) 400-100 MG TABS Take 1 tablet by mouth daily. 28 tablet 2   tamsulosin (FLOMAX) 0.4 MG CAPS capsule Take 1 capsule (0.4 mg total) by mouth daily after supper. (Patient taking differently: Take 0.4 mg by mouth every evening.) 30 capsule 3   zinc oxide 20 % ointment Apply 1 Application topically in the morning and at bedtime. Apply to sacrum     Facility-Administered Medications Ordered in Other Encounters  Medication Dose Route Frequency Provider Last Rate Last Admin   heparin lock flush 100 UNIT/ML injection             Past Medical History:  Diagnosis Date   Anemia    Anxiety  ARF (acute respiratory failure) (HCC)    Atherosclerosis of arteries of extremities (HCC)    Benign prostatic hyperplasia with urinary obstruction    Bladder cancer (HCC)    BPH with obstruction/lower urinary tract symptoms    Chronic viral hepatitis C (HCC)    Colon cancer (HCC)    COPD (chronic obstructive pulmonary disease) (HCC)    Depression    Dysphagia    Family history of colon cancer    Family history of kidney cancer    Family history of leukemia    Family history of prostate cancer    Generalized anxiety disorder    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    GERD (gastroesophageal reflux disease)    Hepatitis    chronic hep c   Hydronephrosis    Hydronephrosis with ureteral stricture    Hyperlipidemia    Insomnia, unspecified    Knee pain    Left   Major depressive  disorder, recurrent, moderate (HCC)    Malignant neoplasm of colon (HCC)    Malnutrition (HCC)    Muscle weakness (generalized)    Nerve pain    Other abnormalities of gait and mobility    Other chronic pain    Peripheral vascular disease (HCC)    Personal history of transient cerebral ischemia    Pressure ulcer    Prostate cancer (HCC)    Stroke (HCC)    Tinea unguium    Tobacco user    Unspecified protein-calorie malnutrition (HCC)    Ureteral cancer, right (HCC)    Urinary frequency    Venous hypertension of both lower extremities    Xerosis cutis     Past Surgical History:  Procedure Laterality Date   AMPUTATION Left 03/03/2023   Procedure: AMPUTATION BELOW KNEE;  Surgeon: Renford Dills, MD;  Location: [facility] ORS;  Service: Vascular;  Laterality: Left;   AMPUTATION Right 09/30/2023   Procedure: AMPUTATION OF AMPUTATION, BELOW KNEE;  Surgeon: Renford Dills, MD;  Location: [facility];  Service: Vascular;  Laterality: Right;   COLON SURGERY     En bloc extended right hemicolectomy 07/2017   COLONOSCOPY WITH PROPOFOL N/A 11/06/2020   Procedure: COLONOSCOPY WITH PROPOFOL;  Surgeon: Wyline Mood, MD;  Location: [facility];  Service: Gastroenterology;  Laterality: N/A;   COLONOSCOPY WITH PROPOFOL N/A 07/31/2021   Procedure: COLONOSCOPY WITH PROPOFOL;  Surgeon: Earline Mayotte, MD;  Location: [facility] ENDOSCOPY;  Service: Endoscopy;  Laterality: N/A;   CYSTOSCOPY W/ RETROGRADES Right 08/30/2018   Procedure: CYSTOSCOPY WITH RETROGRADE PYELOGRAM;  Surgeon: Vanna Scotland, MD;  Location: [facility];  Service: Urology;  Laterality: Right;   CYSTOSCOPY WITH STENT PLACEMENT Right 04/25/2018   Procedure: CYSTOSCOPY WITH STENT PLACEMENT;  Surgeon: Vanna Scotland, MD;  Location: [facility];  Service: Urology;  Laterality: Right;   CYSTOSCOPY WITH STENT PLACEMENT Right 08/30/2018   Procedure: CYSTOSCOPY WITH STENT Exchange;  Surgeon: Vanna Scotland, MD;  Location: [facility];  Service:  Urology;  Laterality: Right;   CYSTOSCOPY WITH STENT PLACEMENT Right 03/07/2019   Procedure: CYSTOSCOPY WITH STENT Exchange;  Surgeon: Vanna Scotland, MD;  Location: [facility];  Service: Urology;  Laterality: Right;   CYSTOSCOPY WITH STENT PLACEMENT Right 11/21/2019   Procedure: CYSTOSCOPY WITH STENT Exchange;  Surgeon: Vanna Scotland, MD;  Location: [facility];  Service: Urology;  Laterality: Right;   LOWER EXTREMITY ANGIOGRAPHY Left 05/23/2019   Procedure: LOWER EXTREMITY ANGIOGRAPHY;  Surgeon: Annice Needy, MD;  Location: [facility];  Service: Cardiovascular;  Laterality: Left;   LOWER EXTREMITY ANGIOGRAPHY Right 05/30/2019   Procedure: LOWER EXTREMITY ANGIOGRAPHY;  Surgeon: Annice Needy, MD;  Location: [facility];  Service: Cardiovascular;  Laterality: Right;   LOWER EXTREMITY ANGIOGRAPHY Right 02/13/2020   Procedure: LOWER EXTREMITY ANGIOGRAPHY;  Surgeon: Annice Needy, MD;  Location: [facility];  Service: Cardiovascular;  Laterality: Right;   LOWER EXTREMITY ANGIOGRAPHY Left 02/20/2020   Procedure: LOWER EXTREMITY ANGIOGRAPHY;  Surgeon: Annice Needy, MD;  Location: [facility];  Service: Cardiovascular;  Laterality: Left;   LOWER EXTREMITY ANGIOGRAPHY Left 01/01/2023   Procedure: Lower Extremity Angiography;  Surgeon: Annice Needy, MD;  Location: [facility];  Service: Cardiovascular;  Laterality: Left;   PORTA CATH INSERTION N/A 02/28/2019   Procedure: PORTA CATH INSERTION;  Surgeon: Annice Needy, MD;  Location: [facility];  Service: Cardiovascular;  Laterality: N/A;   ROBOT ASSISTED LAPAROSCOPIC PARTIAL COLECTOMY  11/17/2022   tumor removed       Social History Social History   Tobacco Use   Smoking status: Every Day    Current packs/day: 0.50    Types: Cigarettes    Passive exposure: Never   Smokeless tobacco: Never  Vaping Use   Vaping status: Never Used  Substance Use Topics   Alcohol use: Not Currently   Drug use: Not  Currently    Types: Marijuana, Methylphenidate    Comment: quit 1997-1998 ish    Family History Family History  Problem Relation Age of Onset   Prostate cancer Father 67   Cancer Brother 22       unsure type   Cancer Paternal Uncle        unsure type   Cancer Maternal Grandmother        unsure type   Cancer Paternal Grandmother        unsure type   Kidney cancer Paternal Grandfather    Cancer Other        unsure types   Leukemia Son    Cancer Son        other cancers, possibly colon    Allergies  Allergen Reactions   Penicillins Rash     REVIEW OF SYSTEMS (Negative unless checked)  Constitutional: [] Weight loss  [] Fever  [] Chills Cardiac: [] Chest pain   [] Chest pressure   [] Palpitations   [] Shortness of breath when laying flat   [] Shortness of breath at rest   [] Shortness of breath with exertion. Vascular:  [] Pain in legs with walking   [] Pain in legs at rest   [] Pain in legs when laying flat   [] Claudication   [] Pain in feet when walking  [] Pain in feet at rest  [] Pain in feet when laying flat   [] History of DVT   [] Phlebitis   [] Swelling in legs   [] Varicose veins   [] Non-healing ulcers Pulmonary:   [] Uses home oxygen   [] Productive cough   [] Hemoptysis   [] Wheeze  [] COPD   [] Asthma Neurologic:  [] Dizziness  [] Blackouts   [] Seizures   [] History of stroke   [] History of TIA  [] Aphasia   [] Temporary blindness   [] Dysphagia   [] Weakness or numbness in arms   [] Weakness or numbness in legs Musculoskeletal:  [] Arthritis   [] Joint swelling   [] Joint pain   [] Low back pain Hematologic:  [] Easy bruising  [] Easy bleeding   [] Hypercoagulable state   [] Anemic  [] Hepatitis Gastrointestinal:  [] Blood in stool   [] Vomiting blood  [] Gastroesophageal reflux/heartburn   []   Difficulty swallowing. Genitourinary:  [] Chronic kidney disease   [] Difficult urination  [] Frequent urination  [] Burning with urination   [] Blood in urine Skin:  [] Rashes   [] Ulcers   [] Wounds Psychological:  [] History  of anxiety   []  History of major depression.  Physical Examination  Vitals:   10/09/23 1931  BP: 131/84  Pulse: 100  Resp: 16  Temp: 98.3 F (36.8 C)  TempSrc: Oral  SpO2: 97%   There is no height or weight on file to calculate BMI. Gen:  WD/WN, NAD Head: [place]/AT, No temporalis wasting. Prominent temp pulse not noted. Ear/Nose/Throat: Hearing grossly intact, nares w/o erythema or drainage, oropharynx w/o Erythema/Exudate Eyes: Sclera non-icteric, conjunctiva clear Neck: Trachea midline.  No JVD.  Pulmonary:  Good air movement, respirations not labored, equal bilaterally.  Cardiac: RRR, normal S1, S2. Vascular:  Vessel Right Left  Radial Palpable Palpable  Ulnar Palpable Palpable  Brachial Palpable Palpable  Carotid Palpable, without bruit Palpable, without bruit  Aorta Not palpable N/A  Femoral Palpable Palpable  Popliteal Doppler Doppler  PT BKA BKA  DP BKA BKA   Gastrointestinal: soft, non-tender/non-distended. No guarding/reflex.  Musculoskeletal: M/S 5/5 throughout.  Extremities without ischemic changes.  Bilateral BKA's.  Right has copious drainage that is mucoid and tan/yellowish. Neurologic: Sensation grossly intact in extremities.  Symmetrical.  Speech is fluent. Motor exam as listed above. Psychiatric: Judgment intact, Mood & affect appropriate for pt's clinical situation. Dermatologic: No rashes or ulcers noted.  No cellulitis or open wounds. Lymph : No Cervical, Axillary, or Inguinal lymphadenopathy.     CBC Lab Results  Component Value Date   WBC 9.4 10/06/2023   HGB 7.2 (L) 10/06/2023   HCT 22.9 (L) 10/06/2023   MCV 81.5 10/06/2023   PLT 307 10/06/2023    BMET    Component Value Date/Time   NA 128 (L) 10/06/2023 0400   K 3.9 10/06/2023 0400   CL 99 10/06/2023 0400   CO2 21 (L) 10/06/2023 0400   GLUCOSE 92 10/06/2023 0400   BUN 16 10/06/2023 0400   CREATININE 0.90 10/06/2023 0400   CREATININE 1.09 08/14/2023 0924   CREATININE 1.07 07/01/2023  1359   CALCIUM 7.9 (L) 10/06/2023 0400   GFRNONAA >60 10/06/2023 0400   GFRNONAA >60 08/14/2023 0924   GFRAA >60 04/19/2020 0858   Estimated Creatinine Clearance: 73.5 mL/min (by C-G formula based on SCr of 0.9 mg/dL).  COAG Lab Results  Component Value Date   INR 1.0 07/01/2023   INR 1.2 02/26/2023   INR 1.01 06/17/2018    Radiology MR FEMUR RIGHT W WO CONTRAST Result Date: 10/02/2023 CLINICAL DATA:  Soft tissue infection suspected. 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 EXAM: MRI OF THE RIGHT FEMUR WITHOUT AND WITH CONTRAST; MRI OF LOWER RIGHT EXTREMITY WITHOUT AND WITH CONTRAST TECHNIQUE: Multiplanar, multisequence MR imaging of the right thigh was performed both before and after administration of intravenous contrast. Multiplanar, multisequence MR imaging of the right calf was performed both before and after administration of intravenous contrast. CONTRAST:  7mL GADAVIST GADOBUTROL 1 MMOL/ML IV SOLN COMPARISON:  CT chest, abdomen, and pelvis 07/31/2023, PET-CT 08/30/2020 FINDINGS: RIGHT THIGH Bones/Joint/Cartilage Normal right femoral marrow signal.  The cortices are intact. Ligaments No ligament tear is seen. Muscles and Tendons There is mild edema within the posterior hamstring musculature within the proximal and distal aspects of the right thigh. Soft tissues Mild edema within the junction of the posterior  right buttock and proximal right thigh subcutaneous fat (axial image 20). Mild-to-moderate subcutaneous fat edema and swelling at the lateral greater than medial aspects of the right knee. -- RIGHT CALF Bones/Joint/Cartilage Postsurgical changes are seen of bony amputation to the level of the proximal right tibial diaphysis and fibular diaphysis. The distal tibia and fibular amputation margins appear sharp. No cortical erosion is seen. No abnormal marrow edema. Note is made the  amputation surgery was only 2 days ago. Moderate degenerative changes of the patellofemoral joint. No knee joint effusion. Ligaments The fibular collateral ligament and the medial collateral ligament appear intact. Muscles and Tendons There is edema within the proximal posterior calf musculature. Soft tissues There is metallic susceptibility artifact from surgical staples along the distal medial, distal, and distal lateral aspect of the right below-the-knee amputation stump. There is diffuse edema throughout the distal amputation stump soft tissues and musculature consistent with recent surgery 2 days ago. Only an 8 mm focus of fluid is seen within the deep aspect of the amputation site, just posterior to the distal remaining tibia (sagittal series 29 image 18, axial series 28, image 42, and coronal series 21, image 18). This may represent a small postoperative seroma. No definite rim enhancing walled-off abscess is seen. IMPRESSION: 1. Postsurgical changes of right below-the-knee amputation to the level of the proximal right tibial diaphysis and fibular diaphysis. No evidence of osteomyelitis. 2. Diffuse edema throughout the distal amputation stump soft tissues and musculature consistent with recent surgery 2 days ago. Only an 8 mm focus of fluid is seen within the deep aspect of the amputation site, just posterior to the distal remaining tibia. This may represent a small postoperative seroma. No definite rim enhancing walled-off abscess is seen. 3. Mild edema within the posterior hamstring musculature within the proximal and distal aspects of the right thigh. This is nonspecific and may represent muscle strain versus myositis. 4. Mild-to-moderate subcutaneous fat edema and swelling at the lateral greater than medial aspects of the right knee. Electronically Signed   By: Neita Garnet M.D.   On: 10/02/2023 13:39   MR TIBIA FIBULA RIGHT W WO CONTRAST Result Date: 10/02/2023 CLINICAL DATA:  Soft tissue infection  suspected. 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 EXAM: MRI OF THE RIGHT FEMUR WITHOUT AND WITH CONTRAST; MRI OF LOWER RIGHT EXTREMITY WITHOUT AND WITH CONTRAST TECHNIQUE: Multiplanar, multisequence MR imaging of the right thigh was performed both before and after administration of intravenous contrast. Multiplanar, multisequence MR imaging of the right calf was performed both before and after administration of intravenous contrast. CONTRAST:  7mL GADAVIST GADOBUTROL 1 MMOL/ML IV SOLN COMPARISON:  CT chest, abdomen, and pelvis 07/31/2023, PET-CT 08/30/2020 FINDINGS: RIGHT THIGH Bones/Joint/Cartilage Normal right femoral marrow signal.  The cortices are intact. Ligaments No ligament tear is seen. Muscles and Tendons There is mild edema within the posterior hamstring musculature within the proximal and distal aspects of the right thigh. Soft tissues Mild edema within the junction of the posterior right buttock and proximal right thigh subcutaneous fat (axial image 20). Mild-to-moderate subcutaneous fat edema and swelling at the lateral greater than medial aspects of the right knee. -- RIGHT CALF Bones/Joint/Cartilage Postsurgical changes are seen of bony amputation to the level of the proximal right tibial diaphysis and fibular diaphysis. The distal tibia and fibular amputation margins appear sharp. No cortical erosion is seen. No abnormal marrow edema. Note is made the amputation surgery was only  2 days ago. Moderate degenerative changes of the patellofemoral joint. No knee joint effusion. Ligaments The fibular collateral ligament and the medial collateral ligament appear intact. Muscles and Tendons There is edema within the proximal posterior calf musculature. Soft tissues There is metallic susceptibility artifact from surgical staples along the distal medial, distal, and distal lateral aspect of the right  below-the-knee amputation stump. There is diffuse edema throughout the distal amputation stump soft tissues and musculature consistent with recent surgery 2 days ago. Only an 8 mm focus of fluid is seen within the deep aspect of the amputation site, just posterior to the distal remaining tibia (sagittal series 29 image 18, axial series 28, image 42, and coronal series 21, image 18). This may represent a small postoperative seroma. No definite rim enhancing walled-off abscess is seen. IMPRESSION: 1. Postsurgical changes of right below-the-knee amputation to the level of the proximal right tibial diaphysis and fibular diaphysis. No evidence of osteomyelitis. 2. Diffuse edema throughout the distal amputation stump soft tissues and musculature consistent with recent surgery 2 days ago. Only an 8 mm focus of fluid is seen within the deep aspect of the amputation site, just posterior to the distal remaining tibia. This may represent a small postoperative seroma. No definite rim enhancing walled-off abscess is seen. 3. Mild edema within the posterior hamstring musculature within the proximal and distal aspects of the right thigh. This is nonspecific and may represent muscle strain versus myositis. 4. Mild-to-moderate subcutaneous fat edema and swelling at the lateral greater than medial aspects of the right knee. Electronically Signed   By: Neita Garnet M.D.   On: 10/02/2023 13:39   DG Chest Port 1 View Result Date: 10/01/2023 CLINICAL DATA:  Fever EXAM: PORTABLE CHEST 1 VIEW COMPARISON:  04/26/2018 FINDINGS: Pulmonary deflation is normal and symmetric. There is asymmetric reticulonodular pulmonary infiltrate within the lung bases bilaterally which may relate to atypical infection in the appropriate clinical setting. No pneumothorax or pleural effusion. Right internal jugular chest port tip seen at the superior cavoatrial junction. Cardiac size within normal limits. Pulmonary vascularity is normal. No acute bone  abnormality. IMPRESSION: 1. Asymmetric reticulonodular pulmonary infiltrate within the lung bases bilaterally which may relate to atypical infection in the appropriate clinical setting. Electronically Signed   By: Helyn Numbers M.D.   On: 10/01/2023 20:33   [place] OR NERVE BLOCK-IMAGE ONLY [facility]) Result Date: 09/30/2023 There is no interpretation for this exam.  This order is for images obtained during a surgical procedure.  Please See "Surgeries" Tab for more information regarding the procedure.      Assessment/Plan 1. Non-healing right BKA incision 2. Plan OR for debridement in am.  Plan to pack and then VAC after 24-48 hours vs. Repeat debridement depending on the operative findings. 3. IV ABX.  Will obtain cultures in the OR but there is a heavy drainage that seems thicker than serous.  I suspect Staph.   Iline Oven, MD  10/09/2023 8:03 PM

## 2023-10-09 NOTE — ED Triage Notes (Signed)
 Per EMS- pt sent due to wound care finding right BKA wound (09/30/2023) discolored, w/ foul smelling drainage, and w/ staples "popped out" during Pt. Pt AOX4, denies complaints.  112/68 100's HR 100% RA 98.6 oral Wound w/ yellow exudate and foul smelling odor noted, wound does not appear well approximated. No redness or erythema noted, mild swelling noted.

## 2023-10-09 NOTE — Progress Notes (Signed)
 Pharmacy Antibiotic Note  Chad [name] is a 63 y.o. male admitted on 10/09/2023 with  Stump wound infection after right BKA .  Patient had amputation on 3/5 and now has copious drainage and mild odor. No reported fever or chills. Pharmacy has been consulted for vancomycin and cefepime dosing.  Plan: S/p Vanc 1.5 g IV x 1 (loading dose) ordered in ED Start vancomycin 1500 mg IV Q24H. Goal AUC 400-550. Expected AUC: 556.3 Expected Css min: 11.5 SCr used: 1.12  Weight used: IBW, Vd used: 0.72 (BMI 20.67) Start cefepime 2 g IV Q12H- borderline for dose adjustment  Continue to monitor renal function and follow culture results      Temp (24hrs), Avg:98.3 F (36.8 C), Min:98.3 F (36.8 C), Max:98.3 F (36.8 C)  Recent Labs  Lab 10/03/23 0259 10/04/23 0554 10/05/23 0417 10/06/23 0400 10/09/23 1945 10/09/23 2001  WBC 12.1* 11.0* 10.0 9.4  --  8.3  CREATININE 0.81 0.60* 0.82 0.90 1.12  --     Estimated Creatinine Clearance: 59.1 mL/min (by C-G formula based on SCr of 1.12 mg/dL).    Allergies  Allergen Reactions   Penicillins Rash    Antimicrobials this admission: 3/14 Vanc >>  3/14 Cefepime >>   Microbiology results: 3/14 BCx: IP 3/14 Wound cx: IP   Thank you for allowing pharmacy to be a part of this patient's care.  Merryl Hacker, PharmD Clinical Pharmacist  10/09/2023 9:02 PM /

## 2023-10-09 NOTE — Progress Notes (Signed)
 ED Pharmacy Antibiotic Sign Off An antibiotic consult was received from an ED provider for cefepime and vancomycin per pharmacy dosing for wound infection. A chart review was completed to assess appropriateness.   The following one time order(s) were placed:  Cefepime 2 g IV x 1 (Noted penicillin allergy but patient has received multiple cephalosporins in the past) Vancomycin 1.5 g IV x 1  Further antibiotic and/or antibiotic pharmacy consults should be ordered by the admitting provider if indicated.   Thank you for allowing pharmacy to be a part of this patient's care.   Merryl Hacker, [facility]  Clinical Pharmacist 10/09/23 8:32 PM

## 2023-10-10 ENCOUNTER — Inpatient Hospital Stay: Admitting: Anesthesiology

## 2023-10-10 ENCOUNTER — Other Ambulatory Visit: Payer: Self-pay

## 2023-10-10 ENCOUNTER — Encounter
Admission: EM | Disposition: A | Discharge status: Skilled Nursing Facility | Payer: Self-pay | Source: Skilled Nursing Facility | Attending: Student

## 2023-10-10 DIAGNOSIS — Z9889 Other specified postprocedural states: Secondary | ICD-10-CM

## 2023-10-10 DIAGNOSIS — T148XXA Other injury of unspecified body region, initial encounter: Secondary | ICD-10-CM | POA: Diagnosis not present

## 2023-10-10 DIAGNOSIS — L089 Local infection of the skin and subcutaneous tissue, unspecified: Secondary | ICD-10-CM | POA: Diagnosis not present

## 2023-10-10 DIAGNOSIS — Z89511 Acquired absence of right leg below knee: Secondary | ICD-10-CM

## 2023-10-10 DIAGNOSIS — T8743 Infection of amputation stump, right lower extremity: Secondary | ICD-10-CM

## 2023-10-10 DIAGNOSIS — T8781 Dehiscence of amputation stump: Secondary | ICD-10-CM

## 2023-10-10 DIAGNOSIS — M24561 Contracture, right knee: Secondary | ICD-10-CM

## 2023-10-10 LAB — CBC
HCT: 24.5 % — ABNORMAL LOW (ref 39.0–52.0)
Hemoglobin: 7.2 g/dL — ABNORMAL LOW (ref 13.0–17.0)
MCH: 25.3 pg — ABNORMAL LOW (ref 26.0–34.0)
MCHC: 29.4 g/dL — ABNORMAL LOW (ref 30.0–36.0)
MCV: 86 fL (ref 80.0–100.0)
Platelets: 437 10*3/uL — ABNORMAL HIGH (ref 150–400)
RBC: 2.85 MIL/uL — ABNORMAL LOW (ref 4.22–5.81)
RDW: 16.9 % — ABNORMAL HIGH (ref 11.5–15.5)
WBC: 7.7 10*3/uL (ref 4.0–10.5)
nRBC: 0 % (ref 0.0–0.2)

## 2023-10-10 LAB — BASIC METABOLIC PANEL
Anion gap: 6 (ref 5–15)
BUN: 27 mg/dL — ABNORMAL HIGH (ref 8–23)
CO2: 18 mmol/L — ABNORMAL LOW (ref 22–32)
Calcium: 8 mg/dL — ABNORMAL LOW (ref 8.9–10.3)
Chloride: 107 mmol/L (ref 98–111)
Creatinine, Ser: 1.02 mg/dL (ref 0.61–1.24)
GFR, Estimated: >60 mL/min (ref >60)
Glucose, Bld: 87 mg/dL (ref 70–99)
Potassium: 3.9 mmol/L (ref 3.5–5.1)
Sodium: 131 mmol/L — ABNORMAL LOW (ref 135–145)

## 2023-10-10 LAB — PROTIME-INR
INR: 1.1 (ref 0.8–1.2)
Prothrombin Time: 14.1 s (ref 11.4–15.2)

## 2023-10-10 LAB — PREPARE RBC (CROSSMATCH)

## 2023-10-10 LAB — APTT: aPTT: 42 s — ABNORMAL HIGH (ref 24–36)

## 2023-10-10 SURGERY — IRRIGATION AND DEBRIDEMENT WOUND
Anesthesia: General | Site: Knee | Laterality: Right

## 2023-10-10 MED ORDER — FENTANYL CITRATE (PF) 100 MCG/2ML IJ SOLN
INTRAMUSCULAR | Status: DC | PRN
Start: 2023-10-10 — End: 2023-10-10
  Administered 2023-10-10 (×3): 50 ug via INTRAVENOUS
  Administered 2023-10-10 (×2): 25 ug via INTRAVENOUS

## 2023-10-10 MED ORDER — PHENYLEPHRINE 80 MCG/ML (10ML) SYRINGE FOR IV PUSH (FOR BLOOD PRESSURE SUPPORT)
PREFILLED_SYRINGE | INTRAVENOUS | Status: AC
  Filled 2023-10-10: qty 10

## 2023-10-10 MED ORDER — OXYCODONE HCL 5 MG PO TABS
ORAL_TABLET | ORAL | Status: AC
  Filled 2023-10-10: qty 1

## 2023-10-10 MED ORDER — MIRTAZAPINE 15 MG PO TABS
7.5000 mg | ORAL_TABLET | Freq: Every day | ORAL | Status: DC
Start: 2023-10-10 — End: 2023-10-16
  Administered 2023-10-10 – 2023-10-15 (×7): 7.5 mg via ORAL
  Filled 2023-10-10 (×7): qty 1

## 2023-10-10 MED ORDER — SODIUM CHLORIDE 0.9 % IV SOLN
2.0000 g | Freq: Three times a day (TID) | INTRAVENOUS | Status: DC
  Administered 2023-10-10 – 2023-10-13 (×9): 2 g via INTRAVENOUS
  Filled 2023-10-10 (×10): qty 12.5

## 2023-10-10 MED ORDER — FENTANYL CITRATE (PF) 100 MCG/2ML IJ SOLN
25.0000 ug | INTRAMUSCULAR | Status: DC | PRN
Start: 2023-10-10 — End: 2023-10-10
  Administered 2023-10-10: 50 ug via INTRAVENOUS
  Administered 2023-10-10: 25 ug via INTRAVENOUS
  Administered 2023-10-10: 50 ug via INTRAVENOUS
  Administered 2023-10-10: 25 ug via INTRAVENOUS
  Administered 2023-10-10: 50 ug via INTRAVENOUS

## 2023-10-10 MED ORDER — VITAMIN B-12 1000 MCG PO TABS
1000.0000 ug | ORAL_TABLET | Freq: Every day | ORAL | Status: DC
  Administered 2023-10-10 – 2023-10-16 (×7): 1000 ug via ORAL
  Filled 2023-10-10 (×8): qty 1

## 2023-10-10 MED ORDER — PROPOFOL 1000 MG/100ML IV EMUL
INTRAVENOUS | Status: AC
  Filled 2023-10-10: qty 100

## 2023-10-10 MED ORDER — OXYCODONE HCL 5 MG/5ML PO SOLN: 5.0000 mg | Freq: Once | ORAL | Status: AC | PRN

## 2023-10-10 MED ORDER — DEXAMETHASONE SODIUM PHOSPHATE 10 MG/ML IJ SOLN
INTRAMUSCULAR | Status: AC
  Filled 2023-10-10: qty 1

## 2023-10-10 MED ORDER — DROPERIDOL 2.5 MG/ML IJ SOLN: 0.6250 mg | Freq: Once | INTRAMUSCULAR | Status: DC | PRN

## 2023-10-10 MED ORDER — FENTANYL CITRATE (PF) 100 MCG/2ML IJ SOLN
INTRAMUSCULAR | Status: AC
Start: 2023-10-10 — End: ?
  Filled 2023-10-10: qty 2

## 2023-10-10 MED ORDER — ESCITALOPRAM OXALATE 10 MG PO TABS
5.0000 mg | ORAL_TABLET | Freq: Every day | ORAL | Status: DC
  Administered 2023-10-10 – 2023-10-16 (×7): 5 mg via ORAL
  Filled 2023-10-10 (×7): qty 1

## 2023-10-10 MED ORDER — CYCLOBENZAPRINE HCL 10 MG PO TABS
15.0000 mg | ORAL_TABLET | Freq: Every day | ORAL | Status: DC
  Administered 2023-10-10 – 2023-10-15 (×7): 15 mg via ORAL
  Filled 2023-10-10 (×7): qty 2

## 2023-10-10 MED ORDER — POLYVINYL ALCOHOL 1.4 % OP SOLN
1.0000 [drp] | Freq: Every day | OPHTHALMIC | Status: DC
  Administered 2023-10-12 – 2023-10-16 (×5): 1 [drp] via OPHTHALMIC
  Filled 2023-10-10: qty 15

## 2023-10-10 MED ORDER — SOFOSBUVIR-VELPATASVIR 400-100 MG PO TABS
1.0000 | ORAL_TABLET | Freq: Every day | ORAL | Status: DC
  Administered 2023-10-14 – 2023-10-16 (×2): 1 via ORAL
  Filled 2023-10-10 (×5): qty 1

## 2023-10-10 MED ORDER — TAMSULOSIN HCL 0.4 MG PO CAPS
0.4000 mg | ORAL_CAPSULE | Freq: Every day | ORAL | Status: DC
  Administered 2023-10-10 – 2023-10-15 (×7): 0.4 mg via ORAL
  Filled 2023-10-10 (×7): qty 1

## 2023-10-10 MED ORDER — EPHEDRINE SULFATE-NACL 50-0.9 MG/10ML-% IV SOSY
PREFILLED_SYRINGE | INTRAVENOUS | Status: DC | PRN
  Administered 2023-10-10 (×2): 10 mg via INTRAVENOUS
  Administered 2023-10-10: 5 mg via INTRAVENOUS
  Administered 2023-10-10: 10 mg via INTRAVENOUS

## 2023-10-10 MED ORDER — VITAMIN C 500 MG PO TABS
500.0000 mg | ORAL_TABLET | Freq: Two times a day (BID) | ORAL | Status: DC
  Administered 2023-10-10 – 2023-10-16 (×13): 500 mg via ORAL
  Filled 2023-10-10 (×13): qty 1

## 2023-10-10 MED ORDER — PHENYLEPHRINE 80 MCG/ML (10ML) SYRINGE FOR IV PUSH (FOR BLOOD PRESSURE SUPPORT)
PREFILLED_SYRINGE | INTRAVENOUS | Status: DC | PRN
Start: 2023-10-10 — End: 2023-10-10
  Administered 2023-10-10: 160 ug via INTRAVENOUS
  Administered 2023-10-10: 80 ug via INTRAVENOUS
  Administered 2023-10-10: 160 ug via INTRAVENOUS
  Administered 2023-10-10: 240 ug via INTRAVENOUS
  Administered 2023-10-10: 160 ug via INTRAVENOUS
  Administered 2023-10-10 (×4): 80 ug via INTRAVENOUS
  Administered 2023-10-10: 160 ug via INTRAVENOUS

## 2023-10-10 MED ORDER — OXYCODONE HCL 5 MG PO TABS
10.0000 mg | ORAL_TABLET | Freq: Once | ORAL | Status: AC | PRN
  Administered 2023-10-10: 10 mg via ORAL

## 2023-10-10 MED ORDER — MIDAZOLAM HCL 2 MG/2ML IJ SOLN
INTRAMUSCULAR | Status: DC | PRN
  Administered 2023-10-10: 2 mg via INTRAVENOUS

## 2023-10-10 MED ORDER — LIDOCAINE HCL (PF) 2 % IJ SOLN
INTRAMUSCULAR | Status: AC
  Filled 2023-10-10: qty 5

## 2023-10-10 MED ORDER — ACETAMINOPHEN 10 MG/ML IV SOLN
INTRAVENOUS | Status: AC
  Filled 2023-10-10: qty 100

## 2023-10-10 MED ORDER — LIDOCAINE HCL (PF) 2 % IJ SOLN
INTRAMUSCULAR | Status: DC | PRN
  Administered 2023-10-10: 60 mg via INTRADERMAL

## 2023-10-10 MED ORDER — ATORVASTATIN CALCIUM 10 MG PO TABS
10.0000 mg | ORAL_TABLET | Freq: Every day | ORAL | Status: DC
  Administered 2023-10-10 – 2023-10-16 (×7): 10 mg via ORAL
  Filled 2023-10-10 (×7): qty 1

## 2023-10-10 MED ORDER — FERROUS FUMARATE 324 (106 FE) MG PO TABS
106.0000 mg | ORAL_TABLET | Freq: Every day | ORAL | Status: DC
  Administered 2023-10-11 – 2023-10-16 (×6): 106 mg via ORAL
  Filled 2023-10-10 (×7): qty 1

## 2023-10-10 MED ORDER — VASHE WOUND IRRIGATION OPTIME
TOPICAL | Status: DC | PRN
  Administered 2023-10-10: 34 [oz_av]

## 2023-10-10 MED ORDER — SENNOSIDES-DOCUSATE SODIUM 8.6-50 MG PO TABS
1.0000 | ORAL_TABLET | Freq: Every day | ORAL | Status: DC
  Administered 2023-10-11 – 2023-10-16 (×5): 1 via ORAL
  Filled 2023-10-10 (×7): qty 1

## 2023-10-10 MED ORDER — FENTANYL CITRATE (PF) 100 MCG/2ML IJ SOLN
INTRAMUSCULAR | Status: AC
  Filled 2023-10-10: qty 2

## 2023-10-10 MED ORDER — HEMOSTATIC AGENTS (NO CHARGE) OPTIME
TOPICAL | Status: DC | PRN
  Administered 2023-10-10: 1 via TOPICAL

## 2023-10-10 MED ORDER — LACTATED RINGERS IV SOLN
INTRAVENOUS | Status: DC | PRN
Start: 2023-10-10 — End: 2023-10-10

## 2023-10-10 MED ORDER — ACETAMINOPHEN 10 MG/ML IV SOLN: 1000.0000 mg | Freq: Once | INTRAVENOUS | Status: DC | PRN

## 2023-10-10 MED ORDER — GABAPENTIN 300 MG PO CAPS
300.0000 mg | ORAL_CAPSULE | Freq: Two times a day (BID) | ORAL | Status: DC
  Administered 2023-10-10 – 2023-10-16 (×13): 300 mg via ORAL
  Filled 2023-10-10 (×13): qty 1

## 2023-10-10 MED ORDER — EPHEDRINE 5 MG/ML INJ
INTRAVENOUS | Status: AC
  Filled 2023-10-10: qty 5

## 2023-10-10 MED ORDER — SODIUM CHLORIDE 0.9% IV SOLUTION: Freq: Once | INTRAVENOUS | Status: AC

## 2023-10-10 MED ORDER — DEXAMETHASONE SODIUM PHOSPHATE 10 MG/ML IJ SOLN
INTRAMUSCULAR | Status: DC | PRN
Start: 2023-10-10 — End: 2023-10-10
  Administered 2023-10-10: 10 mg via INTRAVENOUS

## 2023-10-10 MED ORDER — ADULT MULTIVITAMIN W/MINERALS CH
1.0000 | ORAL_TABLET | Freq: Every day | ORAL | Status: DC
  Administered 2023-10-10 – 2023-10-16 (×7): 1 via ORAL
  Filled 2023-10-10 (×7): qty 1

## 2023-10-10 MED ORDER — OYSTER SHELL CALCIUM/D3 500-5 MG-MCG PO TABS
2.0000 | ORAL_TABLET | Freq: Every day | ORAL | Status: DC
  Administered 2023-10-10 – 2023-10-16 (×7): 2 via ORAL
  Filled 2023-10-10 (×7): qty 2

## 2023-10-10 MED ORDER — PROPOFOL 10 MG/ML IV BOLUS
INTRAVENOUS | Status: DC | PRN
  Administered 2023-10-10: 80 mg via INTRAVENOUS

## 2023-10-10 MED ORDER — ONDANSETRON HCL 4 MG/2ML IJ SOLN
INTRAMUSCULAR | Status: DC | PRN
  Administered 2023-10-10: 4 mg via INTRAVENOUS

## 2023-10-10 MED ORDER — ASPIRIN 81 MG PO TBEC
81.0000 mg | DELAYED_RELEASE_TABLET | Freq: Every day | ORAL | Status: DC
  Administered 2023-10-10: 81 mg via ORAL
  Filled 2023-10-10: qty 1

## 2023-10-10 MED ORDER — ONDANSETRON HCL 4 MG/2ML IJ SOLN
INTRAMUSCULAR | Status: AC
  Filled 2023-10-10: qty 2

## 2023-10-10 MED ORDER — 0.9 % SODIUM CHLORIDE (POUR BTL) OPTIME
TOPICAL | Status: DC | PRN
  Administered 2023-10-10: 1000 mL

## 2023-10-10 MED ORDER — MIDAZOLAM HCL 2 MG/2ML IJ SOLN
INTRAMUSCULAR | Status: AC
  Filled 2023-10-10: qty 2

## 2023-10-10 MED ORDER — ACETAMINOPHEN 10 MG/ML IV SOLN
INTRAVENOUS | Status: DC | PRN
  Administered 2023-10-10: 1000 mg via INTRAVENOUS

## 2023-10-10 SURGICAL SUPPLY — 46 items
BLADE SAGITTAL WIDE XTHICK NO (BLADE) IMPLANT
BLADE SAW SAG 25.4X90 (BLADE) ×1 IMPLANT
BLADE SURG SZ10 CARB STEEL (BLADE) ×1 IMPLANT
BNDG COHESIVE 4X5 TAN STRL LF (GAUZE/BANDAGES/DRESSINGS) ×1 IMPLANT
BNDG ELASTIC 6INX 5YD STR LF (GAUZE/BANDAGES/DRESSINGS) IMPLANT
BNDG ELASTIC 6X5.8 VLCR NS LF (GAUZE/BANDAGES/DRESSINGS) ×1 IMPLANT
BNDG GAUZE DERMACEA FLUFF 4 (GAUZE/BANDAGES/DRESSINGS) ×2 IMPLANT
BRUSH SCRUB EZ 4% CHG (MISCELLANEOUS) ×1 IMPLANT
CHLORAPREP W/TINT 26 (MISCELLANEOUS) ×1 IMPLANT
CLEANSER WND VASHE 34 (WOUND CARE) ×1 IMPLANT
DRAPE INCISE IOBAN 66X60 STRL (DRAPES) ×1 IMPLANT
DRSG GAUZE FLUFF 36X18 (GAUZE/BANDAGES/DRESSINGS) ×1 IMPLANT
ELECT CAUTERY BLADE 6.4 (BLADE) ×1 IMPLANT
ELECT REM PT RETURN 9FT ADLT (ELECTROSURGICAL) ×1 IMPLANT
ELECTRODE REM PT RTRN 9FT ADLT (ELECTROSURGICAL) ×1 IMPLANT
GAUZE SPONGE 4X4 12PLY STRL (GAUZE/BANDAGES/DRESSINGS) IMPLANT
GAUZE XEROFORM 5X9 LF (GAUZE/BANDAGES/DRESSINGS) ×1 IMPLANT
GLOVE BIO SURGEON STRL SZ8 (GLOVE) ×1 IMPLANT
GLOVE BIOGEL PI IND STRL 8 (GLOVE) ×1 IMPLANT
GOWN STRL REUS W/ TWL LRG LVL3 (GOWN DISPOSABLE) ×1 IMPLANT
GOWN STRL REUS W/ TWL XL LVL3 (GOWN DISPOSABLE) ×1 IMPLANT
HANDLE YANKAUER SUCT BULB TIP (MISCELLANEOUS) ×1 IMPLANT
HEMOSTAT SURGICEL 2X14 (HEMOSTASIS) IMPLANT
IMMBOLIZER KNEE 19 BLUE UNIV (SOFTGOODS) ×1 IMPLANT
IMMOB KNEE 24 THIGH 24 443303 (SOFTGOODS) IMPLANT
KIT TURNOVER KIT A (KITS) ×1 IMPLANT
LABEL OR SOLS (LABEL) ×1 IMPLANT
MANIFOLD NEPTUNE II (INSTRUMENTS) ×1 IMPLANT
MAT ABSORB FLUID 56X50 GRAY (MISCELLANEOUS) ×1 IMPLANT
NS IRRIG 1000ML POUR BTL (IV SOLUTION) ×1 IMPLANT
PACK EXTREMITY ARMC (MISCELLANEOUS) ×1 IMPLANT
PAD ABD DERMACEA PRESS 5X9 (GAUZE/BANDAGES/DRESSINGS) ×2 IMPLANT
PAD PREP OB/GYN DISP 24X41 (PERSONAL CARE ITEMS) ×1 IMPLANT
SOL PREP PVP 2OZ (MISCELLANEOUS) ×2 IMPLANT
SOLUTION PREP PVP 2OZ (MISCELLANEOUS) IMPLANT
SPONGE T-LAP 18X18 ~~LOC~~+RFID (SPONGE) ×1 IMPLANT
STAPLER SKIN PROX 35W (STAPLE) ×1 IMPLANT
STOCKINETTE M/LG 89821 (MISCELLANEOUS) ×1 IMPLANT
SUT MNCRL 3-0 UNDYED SH (SUTURE) ×2 IMPLANT
SUT SILK 2 0 SH (SUTURE) ×2 IMPLANT
SUT SILK 2-0 18XBRD TIE 12 (SUTURE) ×1 IMPLANT
SUT SILK 3-0 18XBRD TIE 12 (SUTURE) ×1 IMPLANT
SUT VIC AB 0 CT1 36 (SUTURE) ×2 IMPLANT
SUT VIC AB 2-0 CT1 (SUTURE) ×2 IMPLANT
TRAP FLUID SMOKE EVACUATOR (MISCELLANEOUS) ×1 IMPLANT
WATER STERILE IRR 500ML POUR (IV SOLUTION) ×1 IMPLANT

## 2023-10-10 NOTE — Progress Notes (Signed)
 [facility]  Daily Progress Note   Subjective  -   Sleepy and complains of stump pain.  Does not like the straightening from the bilateral immobilizers.  Says his knees have been bent all his life.  Objective Vitals:   10/10/23 1030 10/10/23 1037 10/10/23 1045 10/10/23 1131  BP: 107/77  110/73 (!) 135/119  Pulse: (!) 115 (!) 110 (!) 113 (!) 114  Resp: (!) 21 16 (!) 22 18  Temp:  (!) 97.3 F (36.3 C) (!) 97.1 F (36.2 C) 97.8 F (36.6 C)  TempSrc:  Temporal  Oral  SpO2: 99% 99% 99% 99%    Intake/Output Summary (Last 24 hours) at 10/10/2023 1237 Last data filed at 10/10/2023 0926 Gross per 24 hour  Intake 1100 ml  Output 100 ml  Net 1000 ml    PULM  CTAB CV  RRR VASC  Clean bilateral BKA stump dressings.  No drainage.  Laboratory CBC    Component Value Date/Time   WBC 7.7 10/10/2023 0514   HGB 7.2 (L) 10/10/2023 0514   HGB 7.1 (L) 08/14/2023 0924   HCT 24.5 (L) 10/10/2023 0514   PLT 437 (H) 10/10/2023 0514   PLT 204 08/14/2023 0924    BMET    Component Value Date/Time   NA 131 (L) 10/10/2023 0514   K 3.9 10/10/2023 0514   CL 107 10/10/2023 0514   CO2 18 (L) 10/10/2023 0514   GLUCOSE 87 10/10/2023 0514   BUN 27 (H) 10/10/2023 0514   CREATININE 1.02 10/10/2023 0514   CREATININE 1.09 08/14/2023 0924   CREATININE 1.07 07/01/2023 1359   CALCIUM 8.0 (L) 10/10/2023 0514   GFRNONAA >60 10/10/2023 0514   GFRNONAA >60 08/14/2023 0924   GFRAA >60 04/19/2020 1308    Assessment/Planning: POD #0 s/p right BKA wound debridement  Plan to return to OR tomorrow for dressing change under anesthesia.  I suspect there may be more non-viable tissue for debridement as well.  He will not tolerate this at bedside yet.  He understands the plan.  He has no additional questions.   Iline Oven  10/10/2023, 12:37 PM

## 2023-10-10 NOTE — Progress Notes (Signed)
 [facility]  Daily Progress Note   Subjective  -   No issues overnight.  Plan for debridement and packing of right BKA wound which has broken down.  Will culture.  He is marked and understands the plan.  Objective Vitals:   10/10/23 0030 10/10/23 0100 10/10/23 0519 10/10/23 0700  BP: 119/85 120/73 96/67 102/76  Pulse: 97 93 90 76  Resp: 20 10 12 11   Temp:   97.6 F (36.4 C)   TempSrc:   Oral   SpO2: 100% 100% 100% 100%    Intake/Output Summary (Last 24 hours) at 10/10/2023 0741 Last data filed at 10/10/2023 0044 Gross per 24 hour  Intake 300 ml  Output --  Net 300 ml    PULM  CTAB CV  RRR VASC  Palpable femoral pulses.  Open and draining right BKA wound.  Laboratory CBC    Component Value Date/Time   WBC 7.7 10/10/2023 0514   HGB 7.2 (L) 10/10/2023 0514   HGB 7.1 (L) 08/14/2023 0924   HCT 24.5 (L) 10/10/2023 0514   PLT 437 (H) 10/10/2023 0514   PLT 204 08/14/2023 0924    BMET    Component Value Date/Time   NA 131 (L) 10/10/2023 0514   K 3.9 10/10/2023 0514   CL 107 10/10/2023 0514   CO2 18 (L) 10/10/2023 0514   GLUCOSE 87 10/10/2023 0514   BUN 27 (H) 10/10/2023 0514   CREATININE 1.02 10/10/2023 0514   CREATININE 1.09 08/14/2023 0924   CREATININE 1.07 07/01/2023 1359   CALCIUM 8.0 (L) 10/10/2023 0514   GFRNONAA >60 10/10/2023 0514   GFRNONAA >60 08/14/2023 0924   GFRAA >60 04/19/2020 2841    Assessment/Planning: POD #0  s/p planned right BKA debridement  Marked and ready for OR today.  NPO after MN.   Iline Oven  10/10/2023, 7:41 AM

## 2023-10-10 NOTE — Progress Notes (Signed)
 [facility] Progress Note  Patient: Chad [name]    ZOX:[id number]  DOA: 10/09/2023     Date of Service: the patient was seen and examined on 10/10/2023  Chief Complaint  Patient presents with   Wound Check   Brief hospital course: Chad [name] is a 63 y.o. male with medical history significant of hyperlipidemia, COPD, PVD, depression with anxiety, anemia, chronic pain syndrome, MSH2 Lynch syndrome, multiple cancer (prostate cancer, urethral cancer, colon cancer), s/p of bilateral BKA, who presents with wound infection of right leg stumps.   Pt had R BKA on 3/5 due to infected right foot. The staples in the medial area of stump have popped out. Pt has foul-smelling drainage saturating the gauze.  Patient has a moderate pain, no fever or chills.  He denies chest pain, cough, SOB.  No nausea, vomiting, diarrhea or abdominal pain.  No symptoms of UTI.   Data reviewed independently and ED Course: pt was found to have WBC 8.3, GFR> 60, temperature normal, blood pressure 131/84, heart rate 100, RR 16, oxygen saturation 97% on room air.  Patient is admitted to MedSurg bed as inpatient.  Dr. [name] Cullens of [facility] is consulted.     EKG: I have personally reviewed.  Sinus rhythm, QTc 466, no obvious ischemic change.  Assessment and Plan:  # 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:  Patient does not have fever or leukocytosis.  Clinically not septic.   Consulted with Dr. [name] Cullens of [facility], who recommended IV antibiotics S/p irrigation and debridement done on 315 Continue vancomycin and cefepime PRN Zofran for nausea, morphine and Percocet for pain F/u Blood cultures x 2  - wound care consult    PVD (peripheral vascular disease) (HCC) -Hold Plavix for surgery -Aspirin, Lipitor   HLD (hyperlipidemia) -Lipitor   Chronic hepatitis C without hepatic coma (HCC): -Continue Epclusa   COPD (chronic obstructive pulmonary disease) (HCC): Stable -Bronchodilators and as needed  Mucinex   Iron deficiency anemia due to chronic blood loss:  Hemoglobin stable, 8.7 on admission and Hb 7.2 on 10/06/2023 3/15 Hb 7.2, Transfuse 1 u prbc  -Follow up with CBC   Depression with anxiety -Continue home medications   # Vitamin B12 deficiency: Started oral supplement.  Follow-up PCP to repeat vitamin B12 level after 3 to 6 months.  Tobacco abuse -Nicotine patch  There is no height or weight on file to calculate BMI.  Interventions:  Diet: Regular DVT Prophylaxis: Subcutaneous Heparin    Advance goals of care discussion: DNR-Limited  Family Communication: family was not present at bedside, at the time of interview.  The pt provided permission to discuss medical plan with the family. Opportunity was given to ask question and all questions were answered satisfactorily.   Disposition:  Pt is from SNF, admitted with Right AKA stump infection, s/p irrigation and debridement done on 3/15, still has pain and on IV abx, pending Cx, which precludes a safe discharge. Discharge to SNF, when stable and cleared by Vascular S[facility].  Subjective: No significant events overnight, patient was seen medication and depression complaining of severe pain.  Denied any other active issues.   Physical Exam: General: NAD, lying comfortably Appear in no distress, affect appropriate Eyes: PERRLA ENT: Oral Mucosa Clear, moist  Neck: no JVD,  Cardiovascular: S1 and S2 Present, no Murmur,  Respiratory: good respiratory effort, Bilateral Air entry equal and Decreased, no Crackles, no wheezes Abdomen: Bowel Sound present, Soft and no tenderness,  Skin: no rashes Extremities: s/p B/L  BKA, s/p Right stump debridement and irrigation Neurologic: without any new focal findings Gait not checked due to patient safety concerns  Vitals:   10/10/23 1037 10/10/23 1045 10/10/23 1131 10/10/23 1329  BP:  110/73 (!) 135/119 92/65  Pulse: (!) 110 (!) 113 (!) 114 (!) 110  Resp: 16 (!) 22 18 17   Temp: (!) 97.3  F (36.3 C) (!) 97.1 F (36.2 C) 97.8 F (36.6 C) 98.2 F (36.8 C)  TempSrc: Temporal  Oral Oral  SpO2: 99% 99% 99% 100%    Intake/Output Summary (Last 24 hours) at 10/10/2023 1518 Last data filed at 10/10/2023 1329 Gross per 24 hour  Intake 1444 ml  Output 100 ml  Net 1344 ml   There were no vitals filed for this visit.  Data Reviewed: I have personally reviewed and interpreted daily labs, tele strips, imagings as discussed above. I reviewed all nursing notes, pharmacy notes, vitals, pertinent old records I have discussed plan of care as described above with RN and patient/family.  CBC: Recent Labs  Lab 10/04/23 0554 10/05/23 0417 10/06/23 0400 10/09/23 2001 10/10/23 0514  WBC 11.0* 10.0 9.4 8.3 7.7  NEUTROABS  --   --   --  5.6  --   HGB 7.3* 7.6* 7.2* 8.7* 7.2*  HCT 23.5* 23.8* 22.9* 27.8* 24.5*  MCV 82.7 80.7 81.5 82.0 86.0  PLT 280 296 307 484* 437*   Basic Metabolic Panel: Recent Labs  Lab 10/04/23 0554 10/05/23 0417 10/06/23 0400 10/09/23 1945 10/10/23 0514  NA 132* 133* 128* 131* 131*  K 3.5 3.7 3.9 4.3 3.9  CL 105 103 99 105 107  CO2 21* 23 21* 18* 18*  GLUCOSE 104* 102* 92 117* 87  BUN 16 16 16  31* 27*  CREATININE 0.60* 0.82 0.90 1.12 1.02  CALCIUM 7.5* 7.7* 7.9* 8.7* 8.0*  MG 1.8  --   --   --   --   PHOS 2.8  --   --   --   --     Studies: No results found.  Scheduled Meds:  ascorbic acid  500 mg Oral BID   aspirin EC  81 mg Oral Daily   atorvastatin  10 mg Oral Daily   calcium-vitamin D  2 tablet Oral Daily   Chlorhexidine Gluconate Cloth  6 each Topical Daily   vitamin B-12  1,000 mcg Oral Daily   cyclobenzaprine  15 mg Oral QHS   escitalopram  5 mg Oral Daily   Ferrous Fumarate  106 mg of iron Oral Daily   gabapentin  300 mg Oral BID   heparin  5,000 Units Subcutaneous Q8H   mirtazapine  7.5 mg Oral QHS   multivitamin with minerals  1 tablet Oral Daily   nicotine  21 mg Transdermal Daily   polyvinyl alcohol  1 drop Both Eyes  Daily   senna-docusate  1 tablet Oral Daily   sodium chloride flush  10-40 mL Intracatheter Q12H   Sofosbuvir-Velpatasvir  1 tablet Oral Daily   tamsulosin  0.4 mg Oral QPC supper   Continuous Infusions:  ceFEPime (MAXIPIME) IV 2 g (10/10/23 1448)   vancomycin     PRN Meds: acetaminophen, albuterol, dextromethorphan-guaiFENesin, morphine injection, ondansetron (ZOFRAN) IV, oxyCODONE, sodium chloride flush  Time spent: 55 minutes  Author: Gillis Santa. MD [facility] 10/10/2023 3:18 PM  To reach On-call, see care teams to locate the attending and reach out to them via [email or web address]. If 7PM-7AM, please contact night-coverage If you still have difficulty reaching  the attending provider, please page the [facility] (Director on Call) for [facility] on amion for assistance.

## 2023-10-10 NOTE — ED Notes (Signed)
Surgery at bedside to transport patient to OR

## 2023-10-10 NOTE — ED Notes (Signed)
Pt resting comfortably in bed at this time. Pt is alert and oriented with even and regular respirations. No acute distress noted. Pt denies any needs at this time. Call light within reach.  °

## 2023-10-10 NOTE — ED Notes (Signed)
Report received from off going Nurse 

## 2023-10-10 NOTE — Op Note (Addendum)
 [facility]  Operative Note  BRODERICK FONSECA 05-30-1961 [id number] 10/10/2023   Pre-op Dx: infected bka stump right Post-op Dx: infected bka stump right  Procedures: Procedure(s): IRRIGATION AND DEBRIDEMENT WOUND RIGHT BKA Right (skin, subcutaneous tissue and muscle/fascia).  Surgeon(s): Elnor Norleen CROME, MD  Assistant: None  General  Complications: None  Estimated Blood Loss: per Anesthesia note  Specimens: Skin, subcutaneous tissue and muscle to Pathology.  Deep specimen to Microbiology.  Findings:Non-viable skin, muscle and subcutaneous tissue.  No frank purulence but liquified tissue present.  Perfusion is adequate.  Pulsatile peroneal inflow.  Indications: Chad [name] is a 63 y.o. male who is 10 days out from right BKA for foot infection.  His wound has dehisced and is draining large volumes of tan/mucoid material.  No WBC elevation, fever or chills.  Procedure Detail: The patient was identified in the holding area and taken to [facility] OR ROOM 09  The patient was then placed supine on the table. general anesthesia was administered.  The patient was prepped and draped in the usual sterile fashion.  A time out was called and antibiotics were administered.    A scalpel and scissors were used to open the wound and excise the prior staple line back to viable skin and subcutaneous tissue.  Non-viable muscle was sharply resected.  Wound measurements were 15 cm long by 10 cm wide and 7 cm deep.The tibia and fibula were fully exposed.  A deep specimen was taken with clean instruments and sent for Microbiology.  The remainder was sent for Pathology.  Hemostasis was improved with cautery and a 2-0 Vicryl suture of a pulsatile peroneal artery.  Surgicel and temporary packing was performed twice for 1- to 15 minutes each to get the diffuse bleeding reduced.  The wound was irrigated with Vashe and the open stump end packed with Vashe soaked Kerlix roll and the absorbent  layers of fluffs and ABD pads and then a compressive Coban wrap applied.  An Ioban dressing was applied to seal the  wound and keep the bed clean and protect staff from his chronic Hep C.  Plan will be to come back to the OR tomorrow for a dressing change and repeat debridement.  He has 60 degree knee contractures bilaterally and bilateral knee immobilizers were placed to help straighten them and keep pressure off the stump ends.  Disposition: To PACU in stable condition.  Norleen CROME Elnor, MD 10/10/2023 9:49 AM

## 2023-10-10 NOTE — Anesthesia Postprocedure Evaluation (Signed)
 Anesthesia Post Note  Patient: Chad [name]  Procedure(s) Performed: IRRIGATION AND DEBRIDEMENT WOUND RIGHT BKA (Right: Knee)  Patient location during evaluation: PACU Anesthesia Type: General Level of consciousness: awake and alert Pain management: pain level controlled Vital Signs Assessment: post-procedure vital signs reviewed and stable Respiratory status: spontaneous breathing, nonlabored ventilation and respiratory function stable Cardiovascular status: blood pressure returned to baseline, stable and tachycardic Postop Assessment: no apparent nausea or vomiting Anesthetic complications: no   No notable events documented.   Last Vitals:  Vitals:   10/10/23 1131 10/10/23 1329  BP: (!) 135/119 92/65  Pulse: (!) 114 (!) 110  Resp: 18 17  Temp: 36.6 C 36.8 C  SpO2: 99% 100%    Last Pain:  Vitals:   10/10/23 1348  TempSrc:   PainSc: 10-Worst pain ever                 Foye Deer

## 2023-10-10 NOTE — Plan of Care (Signed)
  Problem: Clinical Measurements: Goal: Ability to maintain clinical measurements within normal limits will improve Outcome: Progressing Goal: Will remain free from infection Outcome: Progressing Goal: Diagnostic test results will improve Outcome: Progressing Goal: Respiratory complications will improve Outcome: Progressing Goal: Cardiovascular complication will be avoided Outcome: Progressing   Problem: Nutrition: Goal: Adequate nutrition will be maintained Outcome: Progressing   Problem: Coping: Goal: Level of anxiety will decrease Outcome: Progressing   Problem: Elimination: Goal: Will not experience complications related to urinary retention Outcome: Progressing   Problem: Pain Managment: Goal: General experience of comfort will improve and/or be controlled Outcome: Progressing   Problem: Safety: Goal: Ability to remain free from injury will improve Outcome: Progressing   Problem: Skin Integrity: Goal: Risk for impaired skin integrity will decrease Outcome: Progressing

## 2023-10-10 NOTE — Anesthesia Preprocedure Evaluation (Addendum)
 Anesthesia Evaluation  Patient identified by MRN, date of birth, ID band Patient awake    Reviewed: Allergy & Precautions, NPO status , Patient's Chart, lab work & pertinent test results  History of Anesthesia Complications Negative for: history of anesthetic complications  Airway Mallampati: IV  TM Distance: >3 FB Neck ROM: Full    Dental  (+) Edentulous Upper, Edentulous Lower, Dental Advidsory Given   Pulmonary COPD,  COPD inhaler, Current Smoker and Patient abstained from smoking.   Pulmonary exam normal breath sounds clear to auscultation       Cardiovascular Exercise Tolerance: Poor (-) angina + Peripheral Vascular Disease (on Plavix)  Normal cardiovascular exam Rhythm:Regular Rate:Normal  ECG 11/07/22:  Normal sinus rhythm Minimal voltage criteria for LVH, may be normal variant ( R in aVL )  Coronary atherosclerosis on CT scan   Neuro/Psych  PSYCHIATRIC DISORDERS Anxiety Depression    Chronic pain due to neoplasm Managed by palliative care Continue home oxycodone CVA (residual left hand weakness 2018)    GI/Hepatic ,GERD  ,,(+) Hepatitis - (tx started 12/224), CLynch syndrome   Endo/Other  Hypothyroidism    Renal/GU Renal disease (CKD)   BPH Urothelial cancer    Musculoskeletal   Abdominal Normal abdominal exam  (+)   Peds  Hematology  (+) Blood dyscrasia, anemia   Anesthesia Other Findings  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; recurrent sigmoid colon cancer -stage II and multiple other comorbidities including PVD/active smoker  Past Medical History: No date: Anemia No date: Anxiety No date: ARF (acute respiratory failure) (HCC) No date: Atherosclerosis of arteries of extremities (HCC) No date: Benign prostatic hyperplasia with urinary obstruction No date: Bladder cancer (HCC) No date: BPH with obstruction/lower urinary tract symptoms No  date: Chronic viral hepatitis C (HCC) No date: Colon cancer (HCC) No date: COPD (chronic obstructive pulmonary disease) (HCC) No date: Depression No date: Dysphagia No date: Family history of colon cancer No date: Family history of kidney cancer No date: Family history of leukemia No date: Family history of prostate cancer No date: Generalized anxiety disorder No date: 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 No date: GERD (gastroesophageal reflux disease) No date: Hepatitis     Comment:  chronic hep c No date: Hydronephrosis No date: Hydronephrosis with ureteral stricture No date: Hyperlipidemia No date: Insomnia, unspecified No date: Knee pain     Comment:  Left No date: Major depressive disorder, recurrent, moderate (HCC) No date: Malignant neoplasm of colon (HCC) No date: Malnutrition (HCC) No date: Muscle weakness (generalized) No date: Nerve pain No date: Other abnormalities of gait and mobility No date: Other chronic pain No date: Peripheral vascular disease (HCC) No date: Personal history of transient cerebral ischemia No date: Pressure ulcer No date: Prostate cancer (HCC) No date: Stroke [facility]) No date: Tinea unguium No date: Tobacco user No date: Unspecified protein-calorie malnutrition (HCC) No date: Ureteral cancer, right (HCC) No date: Urinary frequency No date: Venous hypertension of both lower extremities No date: Xerosis cutis  Past Surgical History: No date: COLON SURGERY     Comment:  En bloc extended right hemicolectomy 07/2017 11/06/2020: COLONOSCOPY WITH PROPOFOL; N/A     Comment:  Procedure: COLONOSCOPY WITH PROPOFOL;  Surgeon: Wyline Mood, MD;  Location: [facility] ENDOSCOPY;  Service:               Gastroenterology;  Laterality: N/A; 07/31/2021: COLONOSCOPY  WITH PROPOFOL; N/A     Comment:  Procedure: COLONOSCOPY WITH PROPOFOL;  Surgeon: Earline Mayotte, MD;  Location: [facility] ENDOSCOPY;  Service:                Endoscopy;  Laterality: N/A; 08/30/2018: CYSTOSCOPY W/ RETROGRADES; Right     Comment:  Procedure: CYSTOSCOPY WITH RETROGRADE PYELOGRAM;                Surgeon: Vanna Scotland, MD;  Location: [facility] ORS;                Service: Urology;  Laterality: Right; 04/25/2018: CYSTOSCOPY WITH STENT PLACEMENT; Right     Comment:  Procedure: CYSTOSCOPY WITH STENT PLACEMENT;  Surgeon:               Vanna Scotland, MD;  Location: [facility] ORS;  Service:               Urology;  Laterality: Right; 08/30/2018: CYSTOSCOPY WITH STENT PLACEMENT; Right     Comment:  Procedure: CYSTOSCOPY WITH STENT Exchange;  Surgeon:               Vanna Scotland, MD;  Location: [facility] ORS;  Service:               Urology;  Laterality: Right; 03/07/2019: CYSTOSCOPY WITH STENT PLACEMENT; Right     Comment:  Procedure: CYSTOSCOPY WITH STENT Exchange;  Surgeon:               Vanna Scotland, MD;  Location: [facility] ORS;  Service:               Urology;  Laterality: Right; 11/21/2019: CYSTOSCOPY WITH STENT PLACEMENT; Right     Comment:  Procedure: CYSTOSCOPY WITH STENT Exchange;  Surgeon:               Vanna Scotland, MD;  Location: [facility] ORS;  Service:               Urology;  Laterality: Right; 05/23/2019: LOWER EXTREMITY ANGIOGRAPHY; Left     Comment:  Procedure: LOWER EXTREMITY ANGIOGRAPHY;  Surgeon: Annice Needy, MD;  Location: [facility];  Service:               Cardiovascular;  Laterality: Left; 05/30/2019: LOWER EXTREMITY ANGIOGRAPHY; Right     Comment:  Procedure: LOWER EXTREMITY ANGIOGRAPHY;  Surgeon: Annice Needy, MD;  Location: [facility];  Service:               Cardiovascular;  Laterality: Right; 02/13/2020: LOWER EXTREMITY ANGIOGRAPHY; Right     Comment:  Procedure: LOWER EXTREMITY ANGIOGRAPHY;  Surgeon: Annice Needy, MD;  Location: [facility];  Service:               Cardiovascular;  Laterality: Right; 02/20/2020: LOWER EXTREMITY ANGIOGRAPHY;  Left     Comment:  Procedure: LOWER EXTREMITY ANGIOGRAPHY;  Surgeon: Annice Needy, MD;  Location: [facility];  Service:               Cardiovascular;  Laterality:  Left; 01/01/2023: LOWER EXTREMITY ANGIOGRAPHY; Left     Comment:  Procedure: Lower Extremity Angiography;  Surgeon: Annice Needy, MD;  Location: [facility];  Service:               Cardiovascular;  Laterality: Left; 02/28/2019: PORTA CATH INSERTION; N/A     Comment:  Procedure: PORTA CATH INSERTION;  Surgeon: Annice Needy,              MD;  Location: [facility];  Service:               Cardiovascular;  Laterality: N/A; 11/17/2022: ROBOT ASSISTED LAPAROSCOPIC PARTIAL COLECTOMY No date: tumor removed   BMI    Body Mass Index: 19.23 kg/m      Reproductive/Obstetrics negative OB ROS                              Anesthesia Physical Anesthesia Plan  ASA: 3  Anesthesia Plan: General   Post-op Pain Management: Ofirmev IV (intra-op)* and Toradol IV (intra-op)*   Induction: Intravenous  PONV Risk Score and Plan: 2 and Ondansetron and Dexamethasone  Airway Management Planned: LMA  Additional Equipment:   Intra-op Plan:   Post-operative Plan: Extubation in OR  Informed Consent: I have reviewed the patients History and Physical, chart, labs and discussed the procedure including the risks, benefits and alternatives for the proposed anesthesia with the patient or authorized representative who has indicated his/her understanding and acceptance.     Dental Advisory Given  Plan Discussed with: Anesthesiologist, CRNA and Surgeon  Anesthesia Plan Comments:          Anesthesia Quick Evaluation

## 2023-10-10 NOTE — Transfer of Care (Signed)
 Immediate Anesthesia Transfer of Care Note  Patient: Chad [name]  Procedure(s) Performed: IRRIGATION AND DEBRIDEMENT WOUND RIGHT BKA (Right: Knee)  Patient Location: PACU  Anesthesia Type:General  Level of Consciousness: drowsy  Airway & Oxygen Therapy: Patient Spontanous Breathing and Patient connected to face mask oxygen  Post-op Assessment: Report given to RN and Post -op Vital signs reviewed and stable  Post vital signs: Reviewed and stable  Last Vitals:  Vitals Value Taken Time  BP 85/53 10/10/23 0941  Temp    Pulse 109 10/10/23 0942  Resp 19 10/10/23 0942  SpO2 99 % 10/10/23 0942  Vitals shown include unfiled device data.  Last Pain:  Vitals:   10/10/23 0519  TempSrc: Oral  PainSc:          Complications: No notable events documented.

## 2023-10-10 NOTE — ED Notes (Signed)
 Provider notified of pts hemoglobin of 7.2

## 2023-10-10 NOTE — Anesthesia Procedure Notes (Signed)
 Procedure Name: LMA Insertion Date/Time: 10/10/2023 8:12 AM  Performed by: Hezzie Bump, CRNAPre-anesthesia Checklist: Patient identified, Patient being monitored, Timeout performed, Emergency Drugs available and Suction available Patient Re-evaluated:Patient Re-evaluated prior to induction Oxygen Delivery Method: Circle system utilized Preoxygenation: Pre-oxygenation with 100% oxygen Induction Type: IV induction Ventilation: Mask ventilation without difficulty LMA: LMA inserted and LMA with gastric port inserted LMA Size: 4.0 Tube type: Oral Number of attempts: 1 Placement Confirmation: positive ETCO2 and breath sounds checked- equal and bilateral Tube secured with: Tape Dental Injury: Teeth and Oropharynx as per pre-operative assessment

## 2023-10-10 NOTE — OR Nursing (Signed)
 Dr. [name] Cullens placed a knee immobilizer on the left below the knee amputation.

## 2023-10-11 ENCOUNTER — Inpatient Hospital Stay: Admitting: Certified Registered"

## 2023-10-11 ENCOUNTER — Encounter: Payer: Self-pay | Admitting: Vascular Surgery

## 2023-10-11 ENCOUNTER — Other Ambulatory Visit: Payer: Self-pay

## 2023-10-11 ENCOUNTER — Encounter
Admission: EM | Disposition: A | Discharge status: Skilled Nursing Facility | Payer: Self-pay | Source: Skilled Nursing Facility | Attending: Student

## 2023-10-11 DIAGNOSIS — L089 Local infection of the skin and subcutaneous tissue, unspecified: Secondary | ICD-10-CM | POA: Diagnosis not present

## 2023-10-11 DIAGNOSIS — T148XXA Other injury of unspecified body region, initial encounter: Secondary | ICD-10-CM | POA: Diagnosis not present

## 2023-10-11 LAB — CBC
HCT: 18.7 % — ABNORMAL LOW (ref 39.0–52.0)
HCT: 17.7 % — ABNORMAL LOW (ref 39.0–52.0)
Hemoglobin: 6 g/dL — ABNORMAL LOW (ref 13.0–17.0)
Hemoglobin: 5.8 g/dL — ABNORMAL LOW (ref 13.0–17.0)
MCH: 26.5 pg (ref 26.0–34.0)
MCH: 27 pg (ref 26.0–34.0)
MCHC: 32.1 g/dL (ref 30.0–36.0)
MCHC: 32.8 g/dL (ref 30.0–36.0)
MCV: 82.7 fL (ref 80.0–100.0)
MCV: 82.3 fL (ref 80.0–100.0)
Platelets: 377 10*3/uL (ref 150–400)
Platelets: 369 10*3/uL (ref 150–400)
RBC: 2.26 MIL/uL — ABNORMAL LOW (ref 4.22–5.81)
RBC: 2.15 MIL/uL — ABNORMAL LOW (ref 4.22–5.81)
RDW: 16.1 % — ABNORMAL HIGH (ref 11.5–15.5)
RDW: 16.2 % — ABNORMAL HIGH (ref 11.5–15.5)
WBC: 13.1 10*3/uL — ABNORMAL HIGH (ref 4.0–10.5)
WBC: 13.1 10*3/uL — ABNORMAL HIGH (ref 4.0–10.5)
nRBC: 0 % (ref 0.0–0.2)
nRBC: 0 % (ref 0.0–0.2)

## 2023-10-11 LAB — BASIC METABOLIC PANEL
Anion gap: 3 — ABNORMAL LOW (ref 5–15)
BUN: 24 mg/dL — ABNORMAL HIGH (ref 8–23)
CO2: 20 mmol/L — ABNORMAL LOW (ref 22–32)
Calcium: 7.8 mg/dL — ABNORMAL LOW (ref 8.9–10.3)
Chloride: 109 mmol/L (ref 98–111)
Creatinine, Ser: 0.98 mg/dL (ref 0.61–1.24)
GFR, Estimated: >60 mL/min (ref >60)
Glucose, Bld: 107 mg/dL — ABNORMAL HIGH (ref 70–99)
Potassium: 4.1 mmol/L (ref 3.5–5.1)
Sodium: 132 mmol/L — ABNORMAL LOW (ref 135–145)

## 2023-10-11 LAB — HEMOGLOBIN AND HEMATOCRIT, BLOOD
HCT: 21.8 % — ABNORMAL LOW (ref 39.0–52.0)
HCT: 25.5 % — ABNORMAL LOW (ref 39.0–52.0)
Hemoglobin: 7.1 g/dL — ABNORMAL LOW (ref 13.0–17.0)
Hemoglobin: 8.3 g/dL — ABNORMAL LOW (ref 13.0–17.0)

## 2023-10-11 LAB — PREPARE RBC (CROSSMATCH)

## 2023-10-11 LAB — MAGNESIUM: Magnesium: 1.6 mg/dL — ABNORMAL LOW (ref 1.7–2.4)

## 2023-10-11 LAB — PHOSPHORUS: Phosphorus: 3.3 mg/dL (ref 2.5–4.6)

## 2023-10-11 SURGERY — INCISION AND DRAINAGE OF DEEP ABSCESS, KNEE
Anesthesia: General | Site: Leg Lower | Laterality: Right

## 2023-10-11 MED ORDER — OXYCODONE HCL 5 MG PO TABS: 15.0000 mg | ORAL_TABLET | Freq: Once | ORAL | Status: DC | PRN

## 2023-10-11 MED ORDER — GLYCOPYRROLATE 0.2 MG/ML IJ SOLN
INTRAMUSCULAR | Status: AC
  Filled 2023-10-11: qty 1

## 2023-10-11 MED ORDER — ACETAMINOPHEN 10 MG/ML IV SOLN
INTRAVENOUS | Status: AC
  Filled 2023-10-11: qty 100

## 2023-10-11 MED ORDER — ACETAMINOPHEN 10 MG/ML IV SOLN
1000.0000 mg | Freq: Once | INTRAVENOUS | Status: DC | PRN
  Administered 2023-10-11: 1000 mg via INTRAVENOUS

## 2023-10-11 MED ORDER — FENTANYL CITRATE (PF) 100 MCG/2ML IJ SOLN
INTRAMUSCULAR | Status: AC
Start: 2023-10-11 — End: ?
  Filled 2023-10-11: qty 2

## 2023-10-11 MED ORDER — OXYCODONE HCL ER 10 MG PO T12A
EXTENDED_RELEASE_TABLET | ORAL | Status: AC
  Filled 2023-10-11: qty 1

## 2023-10-11 MED ORDER — VITAMIN D (ERGOCALCIFEROL) 1.25 MG (50000 UNIT) PO CAPS
50000.0000 [IU] | ORAL_CAPSULE | ORAL | Status: DC
  Administered 2023-10-11: 50000 [IU] via ORAL
  Filled 2023-10-11: qty 1

## 2023-10-11 MED ORDER — LIDOCAINE HCL (PF) 2 % IJ SOLN
INTRAMUSCULAR | Status: AC
  Filled 2023-10-11: qty 5

## 2023-10-11 MED ORDER — OXYCODONE HCL 5 MG PO TABS
ORAL_TABLET | ORAL | Status: AC
  Filled 2023-10-11: qty 1

## 2023-10-11 MED ORDER — SODIUM CHLORIDE 0.9 % IR SOLN
Status: DC | PRN
Start: 2023-10-11 — End: 2023-10-11
  Administered 2023-10-11: 3000 mL

## 2023-10-11 MED ORDER — FENTANYL CITRATE (PF) 100 MCG/2ML IJ SOLN
INTRAMUSCULAR | Status: DC | PRN
  Administered 2023-10-11 (×2): 50 ug via INTRAVENOUS

## 2023-10-11 MED ORDER — MAGNESIUM SULFATE 2 GM/50ML IV SOLN
2.0000 g | Freq: Once | INTRAVENOUS | Status: AC
  Administered 2023-10-11: 2 g via INTRAVENOUS
  Filled 2023-10-11: qty 50

## 2023-10-11 MED ORDER — SODIUM CHLORIDE 0.9% IV SOLUTION: Freq: Once | INTRAVENOUS | Status: AC

## 2023-10-11 MED ORDER — KETAMINE HCL 50 MG/5ML IJ SOSY
PREFILLED_SYRINGE | INTRAMUSCULAR | Status: AC
  Filled 2023-10-11: qty 5

## 2023-10-11 MED ORDER — GLYCOPYRROLATE 0.2 MG/ML IJ SOLN
INTRAMUSCULAR | Status: DC | PRN
  Administered 2023-10-11: .2 mg via INTRAVENOUS

## 2023-10-11 MED ORDER — OXYCODONE HCL 5 MG PO TABS
ORAL_TABLET | ORAL | Status: AC
  Filled 2023-10-11: qty 2

## 2023-10-11 MED ORDER — DROPERIDOL 2.5 MG/ML IJ SOLN: 0.6250 mg | Freq: Once | INTRAMUSCULAR | Status: DC | PRN

## 2023-10-11 MED ORDER — SODIUM CHLORIDE 0.9% IV SOLUTION: Freq: Once | INTRAVENOUS | Status: DC

## 2023-10-11 MED ORDER — PROPOFOL 500 MG/50ML IV EMUL
INTRAVENOUS | Status: DC | PRN
  Administered 2023-10-11: 75 ug/kg/min via INTRAVENOUS

## 2023-10-11 MED ORDER — LACTATED RINGERS IV SOLN: INTRAVENOUS | Status: DC | PRN

## 2023-10-11 MED ORDER — PROPOFOL 10 MG/ML IV BOLUS
INTRAVENOUS | Status: DC | PRN
  Administered 2023-10-11: 50 mg via INTRAVENOUS
  Administered 2023-10-11: 20 mg via INTRAVENOUS
  Administered 2023-10-11: 50 mg via INTRAVENOUS

## 2023-10-11 MED ORDER — HEPARIN SODIUM (PORCINE) 5000 UNIT/ML IJ SOLN
5000.0000 [IU] | Freq: Three times a day (TID) | INTRAMUSCULAR | Status: DC
  Administered 2023-10-13 – 2023-10-16 (×10): 5000 [IU] via SUBCUTANEOUS
  Filled 2023-10-11 (×10): qty 1

## 2023-10-11 MED ORDER — FENTANYL CITRATE (PF) 100 MCG/2ML IJ SOLN
25.0000 ug | INTRAMUSCULAR | Status: DC | PRN
  Administered 2023-10-11 (×2): 50 ug via INTRAVENOUS

## 2023-10-11 MED ORDER — MIDAZOLAM HCL 5 MG/5ML IJ SOLN
INTRAMUSCULAR | Status: DC | PRN
  Administered 2023-10-11: 2 mg via INTRAVENOUS

## 2023-10-11 MED ORDER — KETAMINE HCL 50 MG/5ML IJ SOSY
PREFILLED_SYRINGE | INTRAMUSCULAR | Status: DC | PRN
Start: 2023-10-11 — End: 2023-10-11
  Administered 2023-10-11: 30 mg via INTRAVENOUS
  Administered 2023-10-11: 20 mg via INTRAVENOUS

## 2023-10-11 MED ORDER — MIDAZOLAM HCL 2 MG/2ML IJ SOLN
INTRAMUSCULAR | Status: AC
Start: 2023-10-11 — End: ?
  Filled 2023-10-11: qty 2

## 2023-10-11 MED ORDER — OXYCODONE HCL 5 MG/5ML PO SOLN: 5.0000 mg | Freq: Once | ORAL | Status: DC | PRN

## 2023-10-11 MED ORDER — 0.9 % SODIUM CHLORIDE (POUR BTL) OPTIME
TOPICAL | Status: DC | PRN
  Administered 2023-10-11: 1000 mL

## 2023-10-11 MED ORDER — PROPOFOL 1000 MG/100ML IV EMUL
INTRAVENOUS | Status: AC
  Filled 2023-10-11: qty 100

## 2023-10-11 SURGICAL SUPPLY — 34 items
BLADE SURG 10 STRL SS (BLADE) IMPLANT
BNDG ELASTIC 4X5.8 VLCR NS LF (GAUZE/BANDAGES/DRESSINGS) ×1 IMPLANT
BNDG ELASTIC 6INX 5YD STR LF (GAUZE/BANDAGES/DRESSINGS) IMPLANT
BNDG ELASTIC 6X5.8 VLCR NS LF (GAUZE/BANDAGES/DRESSINGS) ×1 IMPLANT
BNDG ESMARCH 4X12 STRL LF (GAUZE/BANDAGES/DRESSINGS) ×1 IMPLANT
BNDG GAUZE DERMACEA FLUFF 4 (GAUZE/BANDAGES/DRESSINGS) IMPLANT
CHLORAPREP W/TINT 26 (MISCELLANEOUS) ×1 IMPLANT
CUFF TRNQT CYL 24X4X16.5-23 (TOURNIQUET CUFF) IMPLANT
CUFF TRNQT CYL 34X4.125X (TOURNIQUET CUFF) IMPLANT
DRAPE INCISE IOBAN 66X60 STRL (DRAPES) IMPLANT
ELECT REM PT RETURN 9FT ADLT (ELECTROSURGICAL) ×1 IMPLANT
ELECTRODE REM PT RTRN 9FT ADLT (ELECTROSURGICAL) ×1 IMPLANT
GAUZE SPONGE 4X4 12PLY STRL (GAUZE/BANDAGES/DRESSINGS) ×1 IMPLANT
GAUZE XEROFORM 1X8 LF (GAUZE/BANDAGES/DRESSINGS) ×1 IMPLANT
GLOVE BIO SURGEON STRL SZ8 (GLOVE) ×1 IMPLANT
GOWN STRL REUS W/ TWL LRG LVL3 (GOWN DISPOSABLE) ×1 IMPLANT
GOWN STRL REUS W/TWL LRG LVL4 (GOWN DISPOSABLE) ×1 IMPLANT
HANDPIECE INTERPULSE COAX TIP (DISPOSABLE) IMPLANT
IV LR IRRIG 3000ML ARTHROMATIC (IV SOLUTION) IMPLANT
KIT TURNOVER KIT A (KITS) ×1 IMPLANT
MANIFOLD NEPTUNE II (INSTRUMENTS) ×1 IMPLANT
NS IRRIG 1000ML POUR BTL (IV SOLUTION) ×1 IMPLANT
PACK EXTREMITY ARMC (MISCELLANEOUS) ×1 IMPLANT
PAD ABD DERMACEA PRESS 5X9 (GAUZE/BANDAGES/DRESSINGS) IMPLANT
PAD CAST 3X4 CTTN HI CHSV (CAST SUPPLIES) ×2 IMPLANT
PULSAVAC PLUS IRRIG FAN TIP (DISPOSABLE) IMPLANT
SPONGE T-LAP 18X18 ~~LOC~~+RFID (SPONGE) IMPLANT
STOCKINETTE 48X4 2 PLY STRL (GAUZE/BANDAGES/DRESSINGS) ×1 IMPLANT
STOCKINETTE STRL 4IN 9604848 (GAUZE/BANDAGES/DRESSINGS) ×1 IMPLANT
SUT ETHILON 4 0 P 3 18 (SUTURE) IMPLANT
TIP FAN IRRIG PULSAVAC PLUS (DISPOSABLE) IMPLANT
TOWEL OR 17X26 4PK STRL BLUE (TOWEL DISPOSABLE) IMPLANT
TRAP FLUID SMOKE EVACUATOR (MISCELLANEOUS) ×1 IMPLANT
WATER STERILE IRR 500ML POUR (IV SOLUTION) ×1 IMPLANT

## 2023-10-11 NOTE — Op Note (Addendum)
 [facility]  Operative Note  Chad [name] 02-09-1961 [id number] 10/11/2023   Pre-op Dx: Right  BKA Infection Post-op Dx: Right BKA Infection  Procedures: Procedure(s): INCISION AND DRAINAGE OF DEEP ABSCESS, KNEE Right, BKA stump  Surgeon(s): Elnor Norleen CROME, MD  Assistant: None  General  Complications: None  Estimated Blood Loss: per Anesthesia note  Specimens: Muscle and subcutaneous tissue to Pathology  Findings:Mostly viable skin and deeper tissues with liquifaction and necrosis of scattered regions of subcutaneous tissue, fascia and muscle.  Adequate perfusion.  Indications: [name] is a 63 y.o. male who underwent initial re-opening and debridement of a deep infection in his recent right below knee amputation stump.  He is taken today for planned dressing change with re-inspection of the debrided tissues for likely additional debridement.  The wound is to large, deep and bloody to perform safely and comfortably without general anesthesia and OR competencies.  Procedure Detail: The patient was identified in the holding area and taken to [facility] OR ROOM 09  The patient was then placed supine on the table. general and IV sedation anesthesia was administered.  The patient was prepped and draped in the usual sterile fashion.  A time out was called and antibiotics were ongoing per a prior schedule.  The stump wound was inspected and 25% had areas of necrosis or mucoid degeneration.  A scissors and forceps were used to selectively excise the affected areas of subcutaneous tissue, fascia and muscle to bleeding and healthy appearing levels.  The removed tissues were sent to Pathology.  Wound dimensions are 15 cm long, 10 cm wide and 7 cm deep.  Cautery was used for hemostasis.  2 Liters of saline was used to power irrigate the stump.  The hemostasis was adequate.  The wound was packed with saline soaked Kerlix roll and layers of Kerlix roll, Kerlix fluffs  and ABD pads and an Ace wrap was applied.  An Ioban wrap was applied.  The knee immobilizer was replaced.  EBL was 50 ml.  1 unit of blood was being transfused intra-op for a pre-existing severe anemia.   Disposition: To PACU in stable condition.  Norleen CROME Elnor, MD 10/11/2023 1:17 PM

## 2023-10-11 NOTE — Progress Notes (Signed)
 [facility] Progress Note  Patient: Chad [name]    MWN:[id number]  DOA: 10/09/2023     Date of Service: the patient was seen and examined on 10/11/2023  Chief Complaint  Patient presents with   Wound Check   Brief hospital course: Chad [name] is a 63 y.o. male with medical history significant of hyperlipidemia, COPD, PVD, depression with anxiety, anemia, chronic pain syndrome, MSH2 Lynch syndrome, multiple cancer (prostate cancer, urethral cancer, colon cancer), s/p of bilateral BKA, who presents with wound infection of right leg stumps.   Pt had R BKA on 3/5 due to infected right foot. The staples in the medial area of stump have popped out. Pt has foul-smelling drainage saturating the gauze.  Patient has a moderate pain, no fever or chills.  He denies chest pain, cough, SOB.  No nausea, vomiting, diarrhea or abdominal pain.  No symptoms of UTI.   Data reviewed independently and ED Course: pt was found to have WBC 8.3, GFR> 60, temperature normal, blood pressure 131/84, heart rate 100, RR 16, oxygen saturation 97% on room air.  Patient is admitted to MedSurg bed as inpatient.  Dr. [name] Cullens of [facility] is consulted.     EKG: I have personally reviewed.  Sinus rhythm, QTc 466, no obvious ischemic change.  Assessment and Plan:  # 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:  Patient does not have fever or leukocytosis.  Clinically not septic.   Consulted with Dr. [name] Cullens of [facility], who recommended IV antibiotics S/p irrigation and debridement done on 3/15 Continue vancomycin and cefepime PRN Zofran for nausea, morphine and Percocet for pain Blood cultures x 2 NGTD  - wound care consult 3/14 wound culture pending 3/15 wound culture pending    PVD (peripheral vascular disease) (HCC) -Hold Plavix for surgery -Aspirin, Lipitor 3/16 held ASA as well, due to low Hb  HLD (hyperlipidemia) -Lipitor   Chronic hepatitis C without hepatic coma (HCC): -Continue Epclusa   COPD  (chronic obstructive pulmonary disease) (HCC): Stable -Bronchodilators and as needed Mucinex   Acute blood loss s/p debridement of right AKA stump 3/16 Hb 5.8, s/p 1 U prbc Hb 7.1 so we will give 2nd u prbc today 3/16 held ASA as well, due to low Hb Monitor HH  Iron deficiency anemia due to chronic blood loss:  Hemoglobin stable, 8.7 on admission and Hb 7.2 on 10/06/2023 3/15 Hb 7.2, Transfuse 1 u prbc  -Follow up with CBC    Depression with anxiety -Continue home medications   # Hypomagnesemia, mag effective. # Vitamin B12 deficiency: Started oral supplement.  Follow-up PCP to repeat vitamin B12 level after 3 to 6 months.  Tobacco abuse -Nicotine patch    There is no height or weight on file to calculate BMI.  Interventions:  Diet: Regular DVT Prophylaxis: Subcutaneous Heparin    Advance goals of care discussion: DNR-Limited  Family Communication: family was not present at bedside, at the time of interview.  The pt provided permission to discuss medical plan with the family. Opportunity was given to ask question and all questions were answered satisfactorily.   Disposition:  Pt is from SNF, admitted with Right AKA stump infection, s/p irrigation and debridement done on 3/15, and repeat same procedure on 3/16, still has pain and on IV abx, pending Cx, which precludes a safe discharge. Discharge to SNF, when stable and cleared by Vascular Sx.  Subjective: No significant events overnight, c/o significant pain in the left lower extremity Denied any other complaints  Physical Exam: General: NAD, lying comfortably Appear in no distress, affect appropriate Eyes: PERRLA ENT: Oral Mucosa Clear, moist  Neck: no JVD,  Cardiovascular: S1 and S2 Present, no Murmur,  Respiratory: good respiratory effort, Bilateral Air entry equal and Decreased, no Crackles, no wheezes Abdomen: Bowel Sound present, Soft and no tenderness,  Skin: no rashes Extremities: s/p B/L AKA, s/p Right  stump debridement and irrigation Neurologic: without any new focal findings Gait not checked due to patient safety concerns  Vitals:   10/11/23 0902 10/11/23 0927 10/11/23 0933 10/11/23 1139  BP: (!) 93/57 92/62 119/72 99/69  Pulse: 85 85 79 83  Resp: 16 20  20   Temp: 98.3 F (36.8 C) 98.2 F (36.8 C) 98.8 F (37.1 C) 98.3 F (36.8 C)  TempSrc: Oral Oral Oral Oral  SpO2: 99% 100% 95% 100%    Intake/Output Summary (Last 24 hours) at 10/11/2023 1307 Last data filed at 10/11/2023 1307 Gross per 24 hour  Intake 1906.75 ml  Output 1400 ml  Net 506.75 ml   There were no vitals filed for this visit.  Data Reviewed: I have personally reviewed and interpreted daily labs, tele strips, imagings as discussed above. I reviewed all nursing notes, pharmacy notes, vitals, pertinent old records I have discussed plan of care as described above with RN and patient/family.  CBC: Recent Labs  Lab 10/06/23 0400 10/09/23 2001 10/10/23 0514 10/11/23 0508 10/11/23 0759 10/11/23 1144  WBC 9.4 8.3 7.7 13.1* 13.1*  --   NEUTROABS  --  5.6  --   --   --   --   HGB 7.2* 8.7* 7.2* 6.0* 5.8* 7.1*  HCT 22.9* 27.8* 24.5* 18.7* 17.7* 21.8*  MCV 81.5 82.0 86.0 82.7 82.3  --   PLT 307 484* 437* 377 369  --    Basic Metabolic Panel: Recent Labs  Lab 10/05/23 0417 10/06/23 0400 10/09/23 1945 10/10/23 0514 10/11/23 0508  NA 133* 128* 131* 131* 132*  K 3.7 3.9 4.3 3.9 4.1  CL 103 99 105 107 109  CO2 23 21* 18* 18* 20*  GLUCOSE 102* 92 117* 87 107*  BUN 16 16 31* 27* 24*  CREATININE 0.82 0.90 1.12 1.02 0.98  CALCIUM 7.7* 7.9* 8.7* 8.0* 7.8*  MG  --   --   --   --  1.6*  PHOS  --   --   --   --  3.3    Studies: No results found.  Scheduled Meds:  [MAR Hold] sodium chloride   Intravenous Once   [MAR Hold] ascorbic acid  500 mg Oral BID   [MAR Hold] atorvastatin  10 mg Oral Daily   [MAR Hold] calcium-vitamin D  2 tablet Oral Daily   [MAR Hold] Chlorhexidine Gluconate Cloth  6 each Topical  Daily   [MAR Hold] vitamin B-12  1,000 mcg Oral Daily   [MAR Hold] cyclobenzaprine  15 mg Oral QHS   [MAR Hold] escitalopram  5 mg Oral Daily   [MAR Hold] Ferrous Fumarate  106 mg of iron Oral Daily   [MAR Hold] gabapentin  300 mg Oral BID   [MAR Hold] heparin  5,000 Units Subcutaneous Q8H   [MAR Hold] mirtazapine  7.5 mg Oral QHS   [MAR Hold] multivitamin with minerals  1 tablet Oral Daily   [MAR Hold] nicotine  21 mg Transdermal Daily   [MAR Hold] polyvinyl alcohol  1 drop Both Eyes Daily   [MAR Hold] senna-docusate  1 tablet Oral Daily   [MAR  Hold] sodium chloride flush  10-40 mL Intracatheter Q12H   [MAR Hold] Sofosbuvir-Velpatasvir  1 tablet Oral Daily   [MAR Hold] tamsulosin  0.4 mg Oral QPC supper   [MAR Hold] Vitamin D (Ergocalciferol)  50,000 Units Oral Q7 days   Continuous Infusions:  [MAR Hold] ceFEPime (MAXIPIME) IV 2 g (10/11/23 0511)   [MAR Hold] magnesium sulfate bolus IVPB     [MAR Hold] vancomycin 1,500 mg (10/10/23 2004)   PRN Meds: 0.9 % irrigation (POUR BTL), [MAR Hold] acetaminophen, [MAR Hold] albuterol, [MAR Hold] dextromethorphan-guaiFENesin, [MAR Hold]  morphine injection, [MAR Hold] ondansetron (ZOFRAN) IV, [MAR Hold] oxyCODONE, [MAR Hold] sodium chloride flush, sodium chloride irrigation  Time spent: 55 minutes  Author: Gillis Santa. MD [facility] 10/11/2023 1:07 PM  To reach On-call, see care teams to locate the attending and reach out to them via [email or web address]. If 7PM-7AM, please contact night-coverage If you still have difficulty reaching the attending provider, please page the [facility] (Director on Call) for [facility] on amion for assistance.

## 2023-10-11 NOTE — Progress Notes (Addendum)
 Chad [name]  Daily Progress Note   Subjective  -   Am labs showed his hgb was down to 6 despite 1 unit of PRBC post-op yesterday.  Pre-op hgb was 7.  Stump dressing is completely dry.  He actually feels a lot better compared to yesterday from a leg pain standpoint.  Objective Vitals:   10/11/23 0809 10/11/23 0902 10/11/23 0927 10/11/23 0933  BP: (!) 105/59 (!) 93/57 92/62 119/72  Pulse: 91 85 85 79  Resp: 16 16 20    Temp: 99.1 F (37.3 C) 98.3 F (36.8 C) 98.2 F (36.8 C) 98.8 F (37.1 C)  TempSrc: Oral Oral Oral Oral  SpO2: 98% 99% 100% 95%    Intake/Output Summary (Last 24 hours) at 10/11/2023 1022 Last data filed at 10/11/2023 0731 Gross per 24 hour  Intake 1294.75 ml  Output 1350 ml  Net -55.25 ml    PULM  CTAB CV  RRR VASC  Palpable femoral pulse on right.  Clean and dry operative dressing.  Less knee contractures in the knee immobilizers.  Laboratory CBC    Component Value Date/Time   WBC 13.1 (H) 10/11/2023 0759   HGB 5.8 (L) 10/11/2023 0759   HGB 7.1 (L) 08/14/2023 0924   HCT 17.7 (L) 10/11/2023 0759   PLT 369 10/11/2023 0759   PLT 204 08/14/2023 0924    BMET    Component Value Date/Time   NA 132 (L) 10/11/2023 0508   K 4.1 10/11/2023 0508   CL 109 10/11/2023 0508   CO2 20 (L) 10/11/2023 0508   GLUCOSE 107 (H) 10/11/2023 0508   BUN 24 (H) 10/11/2023 0508   CREATININE 0.98 10/11/2023 0508   CREATININE 1.09 08/14/2023 0924   CREATININE 1.07 07/01/2023 1359   CALCIUM 7.8 (L) 10/11/2023 0508   GFRNONAA >60 10/11/2023 0508   GFRNONAA >60 08/14/2023 0924   GFRAA >60 04/19/2020 4098    Assessment/Planning: POD #1 s/p incision and debridement of infected right BKA stump (many GNR).  Chronic anemia (likely from chronic disease) exacerbated by acute intraoperative blood loss and dilution from IV hydration with severe anemia this morning.    Transfuse 1 unit PRBC now and check hgb with 2 units ahead.  Plan OR today for right stump  dressing change and repeat debridement under anesthesia once the hgb has been brought to an acceptable level (>7).  Hemodynamically he is fine.  He understands the plan and issues and is ready to proceed.   Chad [name]  10/11/2023, 10:22 AM

## 2023-10-11 NOTE — Consult Note (Signed)
 WOC consulted for stump wound, vascular has taken patient to OR today, may need NPWT dressing applied. I have alerted surgeon and added to our FU list for tomorrow.  We do not have WOC nursing on the [facility] campus every day, we will check in with surgeon for placement 3/17   Thanks  Infantof Villagomez [facility] MSN, RN,CWOCN, CNS, CWON-AP [phone])

## 2023-10-11 NOTE — Plan of Care (Signed)
  Problem: Nutrition: Goal: Adequate nutrition will be maintained Outcome: Progressing   Problem: Coping: Goal: Level of anxiety will decrease Outcome: Progressing   Problem: Pain Managment: Goal: General experience of comfort will improve and/or be controlled Outcome: Progressing   Problem: Safety: Goal: Ability to remain free from injury will improve Outcome: Progressing   Problem: Skin Integrity: Goal: Risk for impaired skin integrity will decrease Outcome: Progressing

## 2023-10-11 NOTE — Transfer of Care (Signed)
 Immediate Anesthesia Transfer of Care Note  Patient: Chad [name]  Procedure(s) Performed: INCISION AND DRAINAGE OF DEEP ABSCESS, KNEE (Right: Leg Lower)  Patient Location: PACU  Anesthesia Type:General  Level of Consciousness: sedated  Airway & Oxygen Therapy: Patient Spontanous Breathing and Patient connected to face mask oxygen  Post-op Assessment: Report given to RN and Post -op Vital signs reviewed and stable  Post vital signs: Reviewed  Last Vitals:  Vitals Value Taken Time  BP 90/67 10/11/23 1315  Temp 36.6 C 10/11/23 1315  Pulse 91 10/11/23 1317  Resp 14 10/11/23 1317  SpO2 100 % 10/11/23 1317  Vitals shown include unfiled device data.  Last Pain:  Vitals:   10/11/23 1139  TempSrc: Oral  PainSc:          Complications: No notable events documented.

## 2023-10-11 NOTE — TOC Initial Note (Signed)
 Transition of Care [facility]) - Initial/Assessment Note    Patient Details  Name: Chad [name] MRN: [id number] Date of Birth: [birth year]  Transition of [facility]) CM/SW Contact:    Liliana Cline, LCSW Phone Number: 10/11/2023, 1:08 PM  Clinical Narrative:                 Tanya at [facility] confirmed patient is a long term resident there and can return when medically stable.  Expected Discharge Plan: Skilled Nursing Facility Barriers to Discharge: Continued Medical Work up   Patient Goals and CMS Choice            Expected Discharge Plan and Services       Living arrangements for the past 2 months: Skilled Nursing Facility                                      Prior Living Arrangements/Services Living arrangements for the past 2 months: Skilled Nursing Facility Lives with:: Facility Resident                   Activities of Daily Living      Permission Sought/Granted                  Emotional Assessment              Admission diagnosis:  Wound infection [T14.8XXA, L08.9] Visit for wound check [Z51.89] Patient Active Problem List   Diagnosis Date Noted   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 10/09/2023   HLD (hyperlipidemia) 10/09/2023   PVD (peripheral vascular disease) (HCC) 10/09/2023   COPD (chronic obstructive pulmonary disease) (HCC) 10/09/2023   Depression with anxiety 10/09/2023   Atherosclerosis of artery of extremity with ulceration (HCC) 09/30/2023   Need for hepatitis B screening test 07/01/2023   Screening for HIV (human immunodeficiency virus) 07/01/2023   Chronic hepatitis C without hepatic coma (HCC) 06/17/2023   Open wound 06/16/2023   Medication management 06/16/2023   Protein-calorie malnutrition (HCC) 06/16/2023   Gangrene of toe (HCC) 02/27/2023   Non-healing wound of left lower extremity 02/26/2023   Tobacco abuse 02/26/2023   Cancer of sigmoid colon (HCC) 11/17/2022   Abnormal CT  scan 10/23/2020   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/15/2019   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   Genetic testing 06/15/2019   Swelling of limb 04/15/2019   History of colon cancer 03/24/2019   Family history of prostate cancer    Family history of colon cancer    Family history of leukemia    Family history of kidney cancer    Goals of care, counseling/discussion 12/25/2018   Chronic pain syndrome 11/18/2018   Peripheral vascular disease of lower extremity with ulceration (HCC) 10/04/2018   Neoplasm related pain 09/08/2018   Poor appetite 09/08/2018   Weakness generalized 07/02/2018   Anorexia 07/02/2018   Chronic pain due to neoplasm 07/02/2018   Iron deficiency anemia due to chronic blood loss 05/27/2018   Ureteral cancer, 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 [place] [address] Phone: [phone] Fax: [phone]  [id number] [place], [place] - [address] AT [facility] OF [address] [facility] [facility] [address] [place] [address] Phone: [phone] Fax: [phone]  [facility] [place] [facility] [place], [place] - [address] [place] [address] Phone: [phone] Fax: [phone]  [place] [facility] [address] [place] [address] Phone: [phone] Fax: [phone]     Social Drivers of Health (SDOH) Social History: SDOH Screenings   Food Insecurity: No Food Insecurity (10/10/2023)  Housing: Low Risk  (10/10/2023)  Transportation Needs: No Transportation Needs (10/10/2023)  Utilities: Not At Risk (10/10/2023)  Depression (PHQ2-9): Low Risk   (07/01/2023)  Social Connections: Moderately Integrated (10/10/2023)  Tobacco Use: High Risk (10/09/2023)   SDOH Interventions: Utilities Interventions: Intervention Not Indicated   Readmission Risk Interventions     No data to display

## 2023-10-11 NOTE — Anesthesia Postprocedure Evaluation (Signed)
 Anesthesia Post Note  Patient: Chad [name]  Procedure(s) Performed: INCISION AND DRAINAGE OF DEEP ABSCESS, KNEE (Right: Leg Lower)  Patient location during evaluation: PACU Anesthesia Type: General Level of consciousness: awake and alert Pain management: pain level controlled Vital Signs Assessment: post-procedure vital signs reviewed and stable Respiratory status: spontaneous breathing, nonlabored ventilation and respiratory function stable Cardiovascular status: blood pressure returned to baseline and stable Postop Assessment: no apparent nausea or vomiting Anesthetic complications: no Comments: Hbg 7.1 prior to surgery. Surgery caused bleeding and due to the recent significant drop to 5.8, 1 unit prbc was starting in the pacu. Pt is hemodynamically stable.    No notable events documented.   Last Vitals:  Vitals:   10/11/23 1505 10/11/23 1608  BP: 119/77 104/72  Pulse: 80 72  Resp:  16  Temp: 36.5 C 36.5 C  SpO2: 100% 100%    Last Pain:  Vitals:   10/11/23 1608  TempSrc: Oral  PainSc:                  Foye Deer

## 2023-10-11 NOTE — Anesthesia Preprocedure Evaluation (Signed)
 Anesthesia Evaluation  Patient identified by MRN, date of birth, ID band Patient awake    Reviewed: Allergy & Precautions, NPO status , Patient's Chart, lab work & pertinent test results  History of Anesthesia Complications Negative for: history of anesthetic complications  Airway Mallampati: IV  TM Distance: >3 FB Neck ROM: Full    Dental  (+) Edentulous Upper, Edentulous Lower, Dental Advidsory Given   Pulmonary COPD,  COPD inhaler, Current Smoker and Patient abstained from smoking.   Pulmonary exam normal breath sounds clear to auscultation       Cardiovascular Exercise Tolerance: Poor (-) angina + Peripheral Vascular Disease (on Plavix)  Normal cardiovascular exam Rhythm:Regular Rate:Normal  ECG 11/07/22:  Normal sinus rhythm Minimal voltage criteria for LVH, may be normal variant ( R in aVL )  Coronary atherosclerosis on CT scan   Neuro/Psych  PSYCHIATRIC DISORDERS Anxiety Depression    Chronic pain due to neoplasm Managed by palliative care Continue home oxycodone CVA (residual left hand weakness 2018)    GI/Hepatic ,GERD  ,,(+) Hepatitis - (tx started 12/224), CLynch syndrome   Endo/Other  Hypothyroidism    Renal/GU Renal disease (CKD)   BPH Urothelial cancer    Musculoskeletal   Abdominal Normal abdominal exam  (+)   Peds  Hematology  (+) Blood dyscrasia, anemia   Anesthesia Other Findings  S/p debridement 3/15 with drop of hbg to 5.8. now s/p 1 unit pRBC with appropriate increase to 7.1. Pt denies sob, CP.   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; recurrent sigmoid colon cancer -stage II and multiple other comorbidities including PVD/active smoker  Past Medical History: No date: Anemia No date: Anxiety No date: ARF (acute respiratory failure) (HCC) No date: Atherosclerosis of arteries of extremities (HCC) No date: Benign prostatic  hyperplasia with urinary obstruction No date: Bladder cancer (HCC) No date: BPH with obstruction/lower urinary tract symptoms No date: Chronic viral hepatitis C (HCC) No date: Colon cancer (HCC) No date: COPD (chronic obstructive pulmonary disease) (HCC) No date: Depression No date: Dysphagia No date: Family history of colon cancer No date: Family history of kidney cancer No date: Family history of leukemia No date: Family history of prostate cancer No date: Generalized anxiety disorder No date: 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 No date: GERD (gastroesophageal reflux disease) No date: Hepatitis     Comment:  chronic hep c No date: Hydronephrosis No date: Hydronephrosis with ureteral stricture No date: Hyperlipidemia No date: Insomnia, unspecified No date: Knee pain     Comment:  Left No date: Major depressive disorder, recurrent, moderate (HCC) No date: Malignant neoplasm of colon (HCC) No date: Malnutrition (HCC) No date: Muscle weakness (generalized) No date: Nerve pain No date: Other abnormalities of gait and mobility No date: Other chronic pain No date: Peripheral vascular disease (HCC) No date: Personal history of transient cerebral ischemia No date: Pressure ulcer No date: Prostate cancer (HCC) No date: Stroke [facility]) No date: Tinea unguium No date: Tobacco user No date: Unspecified protein-calorie malnutrition (HCC) No date: Ureteral cancer, right (HCC) No date: Urinary frequency No date: Venous hypertension of both lower extremities No date: Xerosis cutis  Past Surgical History: No date: COLON SURGERY     Comment:  En bloc extended right hemicolectomy 07/2017 11/06/2020: COLONOSCOPY WITH PROPOFOL; N/A     Comment:  Procedure: COLONOSCOPY WITH PROPOFOL;  Surgeon: Wyline Mood, MD;  Location: [facility];  Service:               Gastroenterology;  Laterality: N/A; 07/31/2021: COLONOSCOPY WITH PROPOFOL; N/A     Comment:  Procedure:  COLONOSCOPY WITH PROPOFOL;  Surgeon: Earline Mayotte, MD;  Location: [facility];  Service:               Endoscopy;  Laterality: N/A; 08/30/2018: CYSTOSCOPY W/ RETROGRADES; Right     Comment:  Procedure: CYSTOSCOPY WITH RETROGRADE PYELOGRAM;                Surgeon: Vanna Scotland, MD;  Location: [facility];                Service: Urology;  Laterality: Right; 04/25/2018: CYSTOSCOPY WITH STENT PLACEMENT; Right     Comment:  Procedure: CYSTOSCOPY WITH STENT PLACEMENT;  Surgeon:               Vanna Scotland, MD;  Location: [facility];  Service:               Urology;  Laterality: Right; 08/30/2018: CYSTOSCOPY WITH STENT PLACEMENT; Right     Comment:  Procedure: CYSTOSCOPY WITH STENT Exchange;  Surgeon:               Vanna Scotland, MD;  Location: [facility];  Service:               Urology;  Laterality: Right; 03/07/2019: CYSTOSCOPY WITH STENT PLACEMENT; Right     Comment:  Procedure: CYSTOSCOPY WITH STENT Exchange;  Surgeon:               Vanna Scotland, MD;  Location: [facility];  Service:               Urology;  Laterality: Right; 11/21/2019: CYSTOSCOPY WITH STENT PLACEMENT; Right     Comment:  Procedure: CYSTOSCOPY WITH STENT Exchange;  Surgeon:               Vanna Scotland, MD;  Location: [facility];  Service:               Urology;  Laterality: Right; 05/23/2019: LOWER EXTREMITY ANGIOGRAPHY; Left     Comment:  Procedure: LOWER EXTREMITY ANGIOGRAPHY;  Surgeon: Annice Needy, MD;  Location: [facility];  Service:               Cardiovascular;  Laterality: Left; 05/30/2019: LOWER EXTREMITY ANGIOGRAPHY; Right     Comment:  Procedure: LOWER EXTREMITY ANGIOGRAPHY;  Surgeon: Annice Needy, MD;  Location: [facility];  Service:               Cardiovascular;  Laterality: Right; 02/13/2020: LOWER EXTREMITY ANGIOGRAPHY; Right     Comment:  Procedure: LOWER EXTREMITY ANGIOGRAPHY;  Surgeon: Annice Needy, MD;  Location:  [facility];  Service:               Cardiovascular;  Laterality: Right; 02/20/2020: LOWER EXTREMITY ANGIOGRAPHY; Left     Comment:  Procedure: LOWER EXTREMITY ANGIOGRAPHY;  Surgeon: Annice Needy, MD;  Location: [facility];  Service:               Cardiovascular;  Laterality: Left; 01/01/2023: LOWER EXTREMITY ANGIOGRAPHY; Left     Comment:  Procedure: Lower Extremity Angiography;  Surgeon: Annice Needy, MD;  Location: [facility];  Service:               Cardiovascular;  Laterality: Left; 02/28/2019: PORTA CATH INSERTION; N/A     Comment:  Procedure: PORTA CATH INSERTION;  Surgeon: Annice Needy,              MD;  Location: [facility];  Service:               Cardiovascular;  Laterality: N/A; 11/17/2022: ROBOT ASSISTED LAPAROSCOPIC PARTIAL COLECTOMY No date: tumor removed   BMI    Body Mass Index: 19.23 kg/m      Reproductive/Obstetrics negative OB ROS                              Anesthesia Physical Anesthesia Plan  ASA: 3  Anesthesia Plan: General   Post-op Pain Management: Ofirmev IV (intra-op)* and Toradol IV (intra-op)*   Induction: Intravenous  PONV Risk Score and Plan: 2 and Ondansetron and Dexamethasone  Airway Management Planned: LMA  Additional Equipment:   Intra-op Plan:   Post-operative Plan: Extubation in OR  Informed Consent: I have reviewed the patients History and Physical, chart, labs and discussed the procedure including the risks, benefits and alternatives for the proposed anesthesia with the patient or authorized representative who has indicated his/her understanding and acceptance.     Dental Advisory Given  Plan Discussed with: Anesthesiologist, CRNA and Surgeon  Anesthesia Plan Comments:          Anesthesia Quick Evaluation

## 2023-10-12 ENCOUNTER — Encounter: Payer: Self-pay | Admitting: Internal Medicine

## 2023-10-12 ENCOUNTER — Other Ambulatory Visit: Payer: Self-pay

## 2023-10-12 ENCOUNTER — Encounter: Payer: Self-pay | Admitting: Vascular Surgery

## 2023-10-12 DIAGNOSIS — L089 Local infection of the skin and subcutaneous tissue, unspecified: Secondary | ICD-10-CM | POA: Diagnosis not present

## 2023-10-12 DIAGNOSIS — T148XXA Other injury of unspecified body region, initial encounter: Secondary | ICD-10-CM | POA: Diagnosis not present

## 2023-10-12 LAB — CBC
HCT: 25.6 % — ABNORMAL LOW (ref 39.0–52.0)
Hemoglobin: 8.4 g/dL — ABNORMAL LOW (ref 13.0–17.0)
MCH: 25.8 pg — ABNORMAL LOW (ref 26.0–34.0)
MCHC: 32.8 g/dL (ref 30.0–36.0)
MCV: 78.5 fL — ABNORMAL LOW (ref 80.0–100.0)
Platelets: 383 10*3/uL (ref 150–400)
RBC: 3.26 MIL/uL — ABNORMAL LOW (ref 4.22–5.81)
RDW: 18.8 % — ABNORMAL HIGH (ref 11.5–15.5)
WBC: 12 10*3/uL — ABNORMAL HIGH (ref 4.0–10.5)
nRBC: 0 % (ref 0.0–0.2)

## 2023-10-12 LAB — BASIC METABOLIC PANEL
Anion gap: 6 (ref 5–15)
BUN: 20 mg/dL (ref 8–23)
CO2: 20 mmol/L — ABNORMAL LOW (ref 22–32)
Calcium: 8.7 mg/dL — ABNORMAL LOW (ref 8.9–10.3)
Chloride: 109 mmol/L (ref 98–111)
Creatinine, Ser: 0.82 mg/dL (ref 0.61–1.24)
GFR, Estimated: >60 mL/min (ref >60)
Glucose, Bld: 121 mg/dL — ABNORMAL HIGH (ref 70–99)
Potassium: 4 mmol/L (ref 3.5–5.1)
Sodium: 135 mmol/L (ref 135–145)

## 2023-10-12 LAB — MAGNESIUM: Magnesium: 1.7 mg/dL (ref 1.7–2.4)

## 2023-10-12 LAB — PHOSPHORUS: Phosphorus: 2.3 mg/dL — ABNORMAL LOW (ref 2.5–4.6)

## 2023-10-12 MED ORDER — CHLORHEXIDINE GLUCONATE 4 % EX SOLN: 60.0000 mL | Freq: Once | CUTANEOUS | Status: DC

## 2023-10-12 MED ORDER — SODIUM CHLORIDE 0.9 % IV SOLN: INTRAVENOUS | Status: DC

## 2023-10-12 MED ORDER — SODIUM BICARBONATE 650 MG PO TABS
650.0000 mg | ORAL_TABLET | Freq: Four times a day (QID) | ORAL | Status: AC
  Administered 2023-10-12 (×2): 650 mg via ORAL
  Filled 2023-10-12 (×2): qty 1

## 2023-10-12 MED ORDER — CHLORHEXIDINE GLUCONATE 4 % EX SOLN
60.0000 mL | Freq: Once | CUTANEOUS | Status: AC
  Administered 2023-10-13: 4 via TOPICAL

## 2023-10-12 MED ORDER — VANCOMYCIN HCL 750 MG/150ML IV SOLN
750.0000 mg | Freq: Two times a day (BID) | INTRAVENOUS | Status: DC
  Administered 2023-10-12 – 2023-10-13 (×2): 750 mg via INTRAVENOUS
  Filled 2023-10-12 (×4): qty 150

## 2023-10-12 MED ORDER — K PHOS MONO-SOD PHOS DI & MONO 155-852-130 MG PO TABS
500.0000 mg | ORAL_TABLET | Freq: Four times a day (QID) | ORAL | Status: AC
  Administered 2023-10-12 (×2): 500 mg via ORAL
  Filled 2023-10-12 (×2): qty 2

## 2023-10-12 NOTE — Progress Notes (Signed)
  Progress Note    10/12/2023 2:45 PM 1 Day Post-Op  Subjective: Chad [name] is a 63 year old male who presents to [facility] with right BKA stump infection.  He was taken to the operating room twice over the weekend by Dr. Chrisandra Carota for incisional drainage and washout with cultures.  Post surgical procedure he was noted to have 60 degree knee contractures bilaterally and bilateral knee immobilizers were placed to help straighten them and keep pressure off the stump ends for better healing quality.   Vitals:   10/12/23 0442 10/12/23 0836  BP: 113/66 115/81  Pulse: 89 90  Resp: 20 14  Temp: 99.3 F (37.4 C) 99.5 F (37.5 C)  SpO2: 100% 99%   Physical Exam: Cardiac:  RRR, Normal S1, S2. No murmurs. Lungs:  Non labored breathing, Rhonchi on auscultation. No rales or wheezing.  Incisions:  Right lower extremity BKA.  Extremities:  Right lower extremity BKA. Dressing clean dry and intact.  Abdomen:  Positive bowel sounds throughout, Soft, non tender and non distended.  Neurologic: AAOX1. Person only. Flat affect.  CBC    Component Value Date/Time   WBC 12.0 (H) 10/12/2023 0512   RBC 3.26 (L) 10/12/2023 0512   HGB 8.4 (L) 10/12/2023 0512   HGB 7.1 (L) 08/14/2023 0924   HCT 25.6 (L) 10/12/2023 0512   PLT 383 10/12/2023 0512   PLT 204 08/14/2023 0924   MCV 78.5 (L) 10/12/2023 0512   MCH 25.8 (L) 10/12/2023 0512   MCHC 32.8 10/12/2023 0512   RDW 18.8 (H) 10/12/2023 0512   LYMPHSABS 1.1 10/09/2023 2001   MONOABS 0.6 10/09/2023 2001   EOSABS 0.7 (H) 10/09/2023 2001   BASOSABS 0.1 10/09/2023 2001    BMET    Component Value Date/Time   NA 135 10/12/2023 1152   K 4.0 10/12/2023 1152   CL 109 10/12/2023 1152   CO2 20 (L) 10/12/2023 1152   GLUCOSE 121 (H) 10/12/2023 1152   BUN 20 10/12/2023 1152   CREATININE 0.82 10/12/2023 1152   CREATININE 1.09 08/14/2023 0924   CREATININE 1.07 07/01/2023 1359   CALCIUM 8.7 (L) 10/12/2023 1152   GFRNONAA >60 10/12/2023 1152   GFRNONAA  >60 08/14/2023 0924   GFRAA >60 04/19/2020 0858    INR    Component Value Date/Time   INR 1.1 10/10/2023 0514     Intake/Output Summary (Last 24 hours) at 10/12/2023 1445 Last data filed at 10/12/2023 1100 Gross per 24 hour  Intake 1515.16 ml  Output 1050 ml  Net 465.16 ml     Assessment/Plan:  63 y.o. male is s/p I&D of right BKA infection x 2 on 10/10/2023 and 10/11/2023.  1 Day Post-Op   PLAN  Vascular surgery plans on taking the patient back to the operating room for another incision and drainage and debridement of the right BKA stump wound on 10/13/2023.  However the patient is not happy at this point time due to to the number of return visits to the operating room and therefore is refusing to sign consent for another washout.  I have alerted Dr. [name] MD the surgeon doing the case.  Patient wishes to speak with him directly related to another surgical procedure and the continued care with knee immobilization to help with his contractures.  DVT prophylaxis: Heparin 5000 units subcu every 8 hours.   Marcie Bal [facility] 10/12/2023 2:45 PM

## 2023-10-12 NOTE — Progress Notes (Signed)
 Pharmacy Antibiotic Note  Chad [name] is a 63 y.o. male admitted on 10/09/2023 with  Stump wound infection after right BKA .  Patient had amputation on 3/5 and now has copious drainage and mild odor. No reported fever or chills. Pharmacy has been consulted for vancomycin and cefepime dosing.  Plan: Change vancomycin to 750 mg IV every 12 hours Estimated AUC 460, Cmin 13 63.5 kg, Scr 0.82, Vd 0.72 Vancomycin levels at steady state or as clinically indicated Change cefepime to 2 grams IV every 8 hours Continue to monitor renal function and follow culture results    Temp (24hrs), Avg:98.2 F (36.8 C), Min:97.4 F (36.3 C), Max:99.5 F (37.5 C)  Recent Labs  Lab 10/06/23 0400 10/09/23 1945 10/09/23 2001 10/10/23 0514 10/11/23 0508 10/11/23 0759 10/12/23 0512 10/12/23 1152  WBC 9.4  --  8.3 7.7 13.1* 13.1* 12.0*  --   CREATININE 0.90 1.12  --  1.02 0.98  --   --  0.82    Estimated Creatinine Clearance: 80.7 mL/min (by C-G formula based on SCr of 0.82 mg/dL).    Allergies  Allergen Reactions   Penicillins Rash    Antimicrobials this admission: 3/14 Vanc >>  3/14 Cefepime >>   Microbiology results: 3/14 BCx: IP 3/14 Wound cx: IP   Thank you for allowing pharmacy to be a part of this patient's care.  Barrie Folk, PharmD Clinical Pharmacist  10/12/2023 1:56 PM /

## 2023-10-12 NOTE — Progress Notes (Signed)
 I spoke with the patient.  His primary concern is the knee immobilizers are causing him a significant amount of pain and discomfort.  I think it is absolutely fine to remove the knee immobilizers.  I did discuss the purpose of the immobilizers with try to keep his knee joint straight and that more than 15 degrees of deviation makes it impossible for prosthesis.  He states he is not can get prostheses anyway.  I also discussed that if he does not keep his knee joint straight he will grind the stomach into the mattress creating a decubitus.  He feels that the immobilizers are causing him so much discomfort that he understands but does not want them to be on.  With respect to changing his dressing tomorrow in the operating room he is amenable with this.  He just wanted to be sure we were not doing any further amputations.  He has agreed to debridement if needed and to doing the dressing change with anesthesia.

## 2023-10-12 NOTE — Plan of Care (Signed)

## 2023-10-12 NOTE — Progress Notes (Signed)
 Patient refused Pre op hebiclens bath.Patient educated on the importance of same.Safety maintained

## 2023-10-12 NOTE — Plan of Care (Signed)
  Problem: Nutrition: Goal: Adequate nutrition will be maintained Outcome: Progressing   Problem: Coping: Goal: Level of anxiety will decrease Outcome: Progressing   Problem: Elimination: Goal: Will not experience complications related to bowel motility Outcome: Progressing Goal: Will not experience complications related to urinary retention Outcome: Progressing   Problem: Pain Managment: Goal: General experience of comfort will improve and/or be controlled Outcome: Progressing

## 2023-10-12 NOTE — Care Management Important Message (Signed)
 Important Message  Patient Details  Name: Chad [name] MRN: [id number] Date of Birth: 1960/12/06   Important Message Given:  Yes - Medicare IM     Cristela Blue, CMA 10/12/2023, 12:01 PM

## 2023-10-12 NOTE — Progress Notes (Signed)
 [facility] Progress Note  Patient: Chad [name]    JYN:[id number]  DOA: 10/09/2023     Date of Service: the patient was seen and examined on 10/12/2023  Chief Complaint  Patient presents with   Wound Check   Brief hospital course: Chad [name] is a 63 y.o. male with medical history significant of hyperlipidemia, COPD, PVD, depression with anxiety, anemia, chronic pain syndrome, MSH2 Lynch syndrome, multiple cancer (prostate cancer, urethral cancer, colon cancer), s/p of bilateral BKA, who presents with wound infection of right leg stumps.   Pt had R BKA on 3/5 due to infected right foot. The staples in the medial area of stump have popped out. Pt has foul-smelling drainage saturating the gauze.  Patient has a moderate pain, no fever or chills.  He denies chest pain, cough, SOB.  No nausea, vomiting, diarrhea or abdominal pain.  No symptoms of UTI.   Data reviewed independently and ED Course: pt was found to have WBC 8.3, GFR> 60, temperature normal, blood pressure 131/84, heart rate 100, RR 16, oxygen saturation 97% on room air.  Patient is admitted to MedSurg bed as inpatient.  Dr. [name] Cullens of [facility] is consulted.     EKG: I have personally reviewed.  Sinus rhythm, QTc 466, no obvious ischemic change.  Assessment and Plan:  # 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:  Patient does not have fever or leukocytosis.  Clinically not septic.   Consulted with Dr. [name] Cullens of [facility], who recommended IV antibiotics S/p irrigation and debridement done on 3/15 Continue vancomycin and cefepime PRN Zofran for nausea, morphine and Percocet for pain Blood cultures x 2 NGTD  - wound care consult 3/14 wound culture growing Proteus mirabilis  3/15 wound culture pending 3/17 ID consulted for Abx and duration of Rx    PVD (peripheral vascular disease) (HCC) S/p Bilateral BKA -Hold Plavix for surgery -Aspirin, Lipitor 3/16 held ASA as well, due to low Hb  HLD  (hyperlipidemia) -Lipitor   Chronic hepatitis C without hepatic coma (HCC): -Continue Epclusa   COPD (chronic obstructive pulmonary disease) (HCC): Stable -Bronchodilators and as needed Mucinex   Acute blood loss anemia s/p debridement of right AKA stump 3/16 Hb 5.8, s/p 1 U prbc Hb 7.1 so we will give 2nd u prbc today 3/16 held ASA as well, due to low Hb 3/17 Hb 8.4 stable Monitor HH  Iron deficiency anemia due to chronic blood loss:  Hemoglobin stable, 8.7 on admission and Hb 7.2 on 10/06/2023 3/15 Hb 7.2, Transfuse 1 u prbc  -Follow up with CBC    Depression with anxiety -Continue home medications   # Hypophosphatemia, Phos repleted # Hypomagnesemia, mag repleted # Vitamin B12 deficiency: Started oral supplement.  Follow-up PCP to repeat vitamin B12 level after 3 to 6 months.  Tobacco abuse -Nicotine patch    There is no height or weight on file to calculate BMI.  Interventions:  Diet: Regular DVT Prophylaxis: Subcutaneous Heparin    Advance goals of care discussion: DNR-Limited  Family Communication: family was not present at bedside, at the time of interview.  The pt provided permission to discuss medical plan with the family. Opportunity was given to ask question and all questions were answered satisfactorily.   Disposition:  Pt is from SNF, admitted with Right BKA stump infection, s/p irrigation and debridement done on 3/15, and repeat same procedure on 3/16, still has pain and on IV abx, pending Cx, which precludes a safe discharge. Discharge to SNF, when  stable and cleared by Vascular Sx.  Subjective: No significant events overnight, c/o some pain so he released a little bit of his knee immobilizer. Still does not understand the need of knee immobilizer on b/l knee joints    Physical Exam: General: NAD, lying comfortably Appear in no distress, affect appropriate Eyes: PERRLA ENT: Oral Mucosa Clear, moist  Neck: no JVD,  Cardiovascular: S1 and S2  Present, no Murmur,  Respiratory: good respiratory effort, Bilateral Air entry equal and Decreased, no Crackles, no wheezes Abdomen: Bowel Sound present, Soft and no tenderness,  Skin: no rashes Extremities: s/p B/L AKA, s/p Right stump debridement and irrigation Neurologic: without any new focal findings Gait not checked due to patient safety concerns  Vitals:   10/11/23 2025 10/12/23 0442 10/12/23 0836 10/12/23 1531  BP: 132/83 113/66 115/81 125/83  Pulse: 82 89 90 100  Resp: 18 20 14 14   Temp: 98.2 F (36.8 C) 99.3 F (37.4 C) 99.5 F (37.5 C) 98.5 F (36.9 C)  TempSrc: Oral Oral Oral Oral  SpO2: 98% 100% 99% 96%    Intake/Output Summary (Last 24 hours) at 10/12/2023 1540 Last data filed at 10/12/2023 1532 Gross per 24 hour  Intake 1515.16 ml  Output 1900 ml  Net -384.84 ml   There were no vitals filed for this visit.  Data Reviewed: I have personally reviewed and interpreted daily labs, tele strips, imagings as discussed above. I reviewed all nursing notes, pharmacy notes, vitals, pertinent old records I have discussed plan of care as described above with RN and patient/family.  CBC: Recent Labs  Lab 10/09/23 2001 10/10/23 0514 10/11/23 0508 10/11/23 0759 10/11/23 1144 10/11/23 1740 10/12/23 0512  WBC 8.3 7.7 13.1* 13.1*  --   --  12.0*  NEUTROABS 5.6  --   --   --   --   --   --   HGB 8.7* 7.2* 6.0* 5.8* 7.1* 8.3* 8.4*  HCT 27.8* 24.5* 18.7* 17.7* 21.8* 25.5* 25.6*  MCV 82.0 86.0 82.7 82.3  --   --  78.5*  PLT 484* 437* 377 369  --   --  383   Basic Metabolic Panel: Recent Labs  Lab 10/06/23 0400 10/09/23 1945 10/10/23 0514 10/11/23 0508 10/12/23 1152  NA 128* 131* 131* 132* 135  K 3.9 4.3 3.9 4.1 4.0  CL 99 105 107 109 109  CO2 21* 18* 18* 20* 20*  GLUCOSE 92 117* 87 107* 121*  BUN 16 31* 27* 24* 20  CREATININE 0.90 1.12 1.02 0.98 0.82  CALCIUM 7.9* 8.7* 8.0* 7.8* 8.7*  MG  --   --   --  1.6* 1.7  PHOS  --   --   --  3.3 2.3*     Studies: No results found.  Scheduled Meds:  sodium chloride   Intravenous Once   ascorbic acid  500 mg Oral BID   atorvastatin  10 mg Oral Daily   calcium-vitamin D  2 tablet Oral Daily   Chlorhexidine Gluconate Cloth  6 each Topical Daily   vitamin B-12  1,000 mcg Oral Daily   cyclobenzaprine  15 mg Oral QHS   escitalopram  5 mg Oral Daily   Ferrous Fumarate  106 mg of iron Oral Daily   gabapentin  300 mg Oral BID   [START ON 10/13/2023] heparin  5,000 Units Subcutaneous Q8H   mirtazapine  7.5 mg Oral QHS   multivitamin with minerals  1 tablet Oral Daily   nicotine  21 mg  Transdermal Daily   polyvinyl alcohol  1 drop Both Eyes Daily   senna-docusate  1 tablet Oral Daily   sodium chloride flush  10-40 mL Intracatheter Q12H   Sofosbuvir-Velpatasvir  1 tablet Oral Daily   tamsulosin  0.4 mg Oral QPC supper   Vitamin D (Ergocalciferol)  50,000 Units Oral Q7 days   Continuous Infusions:  ceFEPime (MAXIPIME) IV 2 g (10/12/23 1354)   vancomycin     PRN Meds: acetaminophen, albuterol, dextromethorphan-guaiFENesin, morphine injection, ondansetron (ZOFRAN) IV, oxyCODONE, sodium chloride flush  Time spent: 40 minutes  Author: Gillis Santa. MD [facility] 10/12/2023 3:40 PM  To reach On-call, see care teams to locate the attending and reach out to them via [email or web address]. If 7PM-7AM, please contact night-coverage If you still have difficulty reaching the attending provider, please page the [facility] (Director on Call) for [facility] on amion for assistance.

## 2023-10-12 NOTE — Telephone Encounter (Signed)
 Nurse hasn't called back and I can't get an answer at the facility. Last week I had to call 3 times.

## 2023-10-12 NOTE — Consult Note (Addendum)
 WOC Nurse wound follow up Discussed plan of care via Secure chat with the Vascular team. Pt plans to return to the OR tomorrow for a dressing change; no Vac requested at this time.   Please re-consult the WOC team if a Vac placement or change is desired after the upcoming surgery.  Please re-consult if further assistance is needed.  Thank-you,  Cammie Mcgee MSN, RN, CWOCN, [place], CNS [phone]

## 2023-10-13 ENCOUNTER — Inpatient Hospital Stay: Admitting: Anesthesiology

## 2023-10-13 ENCOUNTER — Other Ambulatory Visit: Payer: Self-pay

## 2023-10-13 ENCOUNTER — Encounter
Admission: EM | Disposition: A | Discharge status: Skilled Nursing Facility | Payer: Self-pay | Source: Skilled Nursing Facility | Attending: Student

## 2023-10-13 DIAGNOSIS — T148XXA Other injury of unspecified body region, initial encounter: Secondary | ICD-10-CM | POA: Diagnosis not present

## 2023-10-13 DIAGNOSIS — T8753 Necrosis of amputation stump, right lower extremity: Secondary | ICD-10-CM

## 2023-10-13 DIAGNOSIS — T8781 Dehiscence of amputation stump: Secondary | ICD-10-CM | POA: Diagnosis not present

## 2023-10-13 DIAGNOSIS — Z89511 Acquired absence of right leg below knee: Secondary | ICD-10-CM | POA: Diagnosis not present

## 2023-10-13 DIAGNOSIS — L089 Local infection of the skin and subcutaneous tissue, unspecified: Secondary | ICD-10-CM | POA: Diagnosis not present

## 2023-10-13 DIAGNOSIS — T8740 Infection of amputation stump, unspecified extremity: Secondary | ICD-10-CM

## 2023-10-13 DIAGNOSIS — T8743 Infection of amputation stump, right lower extremity: Secondary | ICD-10-CM | POA: Diagnosis not present

## 2023-10-13 DIAGNOSIS — B182 Chronic viral hepatitis C: Secondary | ICD-10-CM | POA: Diagnosis not present

## 2023-10-13 LAB — TYPE AND SCREEN
ABO/RH(D): O NEG
Antibody Screen: POSITIVE
Donor AG Type: NEGATIVE
Unit division: 0
Unit division: 0
Unit division: 0
Unit division: 0
Unit division: 0
Unit division: 0
Unit division: 0

## 2023-10-13 LAB — BPAM RBC
Blood Product Expiration Date: 202503212359
Blood Product Expiration Date: 202504042359
Blood Product Expiration Date: 202504072359
Blood Product Expiration Date: 202504122359
Blood Product Unit Number: 202504122359
ISSUE DATE / TIME: 202503151009
ISSUE DATE / TIME: 202503160908
ISSUE DATE / TIME: 202503161256
Unit Type and Rh: 202504122359
Unit Type and Rh: 202504122359
Unit Type and Rh: 5100
Unit Type and Rh: 5100
Unit Type and Rh: 5100
Unit Type and Rh: 5100
Unit Type and Rh: 9500

## 2023-10-13 LAB — CBC
HCT: 26.3 % — ABNORMAL LOW (ref 39.0–52.0)
Hemoglobin: 8.6 g/dL — ABNORMAL LOW (ref 13.0–17.0)
MCH: 26.1 pg (ref 26.0–34.0)
MCHC: 32.7 g/dL (ref 30.0–36.0)
MCV: 79.9 fL — ABNORMAL LOW (ref 80.0–100.0)
Platelets: 368 10*3/uL (ref 150–400)
RBC: 3.29 MIL/uL — ABNORMAL LOW (ref 4.22–5.81)
RDW: 18.8 % — ABNORMAL HIGH (ref 11.5–15.5)
WBC: 12.1 10*3/uL — ABNORMAL HIGH (ref 4.0–10.5)
nRBC: 0.2 % (ref 0.0–0.2)

## 2023-10-13 LAB — BASIC METABOLIC PANEL
Anion gap: 8 (ref 5–15)
BUN: 19 mg/dL (ref 8–23)
CO2: 21 mmol/L — ABNORMAL LOW (ref 22–32)
Calcium: 8.9 mg/dL (ref 8.9–10.3)
Chloride: 107 mmol/L (ref 98–111)
Creatinine, Ser: 0.75 mg/dL (ref 0.61–1.24)
GFR, Estimated: >60 mL/min (ref >60)
Glucose, Bld: 97 mg/dL (ref 70–99)
Potassium: 3.7 mmol/L (ref 3.5–5.1)
Sodium: 136 mmol/L (ref 135–145)

## 2023-10-13 LAB — PHOSPHORUS: Phosphorus: 3.3 mg/dL (ref 2.5–4.6)

## 2023-10-13 LAB — MAGNESIUM: Magnesium: 1.3 mg/dL — ABNORMAL LOW (ref 1.7–2.4)

## 2023-10-13 SURGERY — IRRIGATION AND DEBRIDEMENT WOUND
Anesthesia: General | Site: Leg Lower | Laterality: Right

## 2023-10-13 MED ORDER — OXYCODONE HCL 5 MG PO TABS
5.0000 mg | ORAL_TABLET | Freq: Once | ORAL | Status: AC | PRN
  Administered 2023-10-13: 5 mg via ORAL

## 2023-10-13 MED ORDER — HYDROMORPHONE HCL 1 MG/ML IJ SOLN
INTRAMUSCULAR | Status: AC
  Filled 2023-10-13: qty 1

## 2023-10-13 MED ORDER — PHENYLEPHRINE 80 MCG/ML (10ML) SYRINGE FOR IV PUSH (FOR BLOOD PRESSURE SUPPORT)
PREFILLED_SYRINGE | INTRAVENOUS | Status: DC | PRN
Start: 2023-10-13 — End: 2023-10-13
  Administered 2023-10-13 (×3): 80 ug via INTRAVENOUS
  Administered 2023-10-13: 160 ug via INTRAVENOUS

## 2023-10-13 MED ORDER — KETAMINE HCL 50 MG/5ML IJ SOSY
PREFILLED_SYRINGE | INTRAMUSCULAR | Status: AC
  Filled 2023-10-13: qty 5

## 2023-10-13 MED ORDER — PROPOFOL 500 MG/50ML IV EMUL
INTRAVENOUS | Status: DC | PRN
  Administered 2023-10-13: 50 mg via INTRAVENOUS
  Administered 2023-10-13: 20 mg via INTRAVENOUS
  Administered 2023-10-13: 100 ug/kg/min via INTRAVENOUS

## 2023-10-13 MED ORDER — LACTATED RINGERS IV SOLN: INTRAVENOUS | Status: DC | PRN

## 2023-10-13 MED ORDER — METHYLPREDNISOLONE SODIUM SUCC 125 MG IJ SOLR: 125.0000 mg | Freq: Once | INTRAMUSCULAR | Status: DC | PRN

## 2023-10-13 MED ORDER — MAGNESIUM SULFATE 4 GM/100ML IV SOLN
4.0000 g | Freq: Once | INTRAVENOUS | Status: AC
  Administered 2023-10-13: 4 g via INTRAVENOUS
  Filled 2023-10-13: qty 100

## 2023-10-13 MED ORDER — FENTANYL CITRATE (PF) 100 MCG/2ML IJ SOLN
INTRAMUSCULAR | Status: DC | PRN
  Administered 2023-10-13: 25 ug via INTRAVENOUS
  Administered 2023-10-13: 50 ug via INTRAVENOUS
  Administered 2023-10-13: 25 ug via INTRAVENOUS

## 2023-10-13 MED ORDER — OXYCODONE HCL 5 MG/5ML PO SOLN: 5.0000 mg | Freq: Once | ORAL | Status: AC | PRN

## 2023-10-13 MED ORDER — MIDAZOLAM HCL 2 MG/2ML IJ SOLN
INTRAMUSCULAR | Status: AC
  Filled 2023-10-13: qty 2

## 2023-10-13 MED ORDER — DIPHENHYDRAMINE HCL 50 MG/ML IJ SOLN: 50.0000 mg | Freq: Once | INTRAMUSCULAR | Status: DC | PRN

## 2023-10-13 MED ORDER — GLYCOPYRROLATE 0.2 MG/ML IJ SOLN
INTRAMUSCULAR | Status: DC | PRN
  Administered 2023-10-13: .1 mg via INTRAVENOUS

## 2023-10-13 MED ORDER — MIDAZOLAM HCL 2 MG/2ML IJ SOLN
INTRAMUSCULAR | Status: DC | PRN
  Administered 2023-10-13: 2 mg via INTRAVENOUS

## 2023-10-13 MED ORDER — ONDANSETRON HCL 4 MG/2ML IJ SOLN
INTRAMUSCULAR | Status: DC | PRN
  Administered 2023-10-13: 4 mg via INTRAVENOUS

## 2023-10-13 MED ORDER — ONDANSETRON HCL 4 MG/2ML IJ SOLN
INTRAMUSCULAR | Status: AC
  Filled 2023-10-13: qty 2

## 2023-10-13 MED ORDER — FAMOTIDINE 20 MG PO TABS: 40.0000 mg | ORAL_TABLET | Freq: Once | ORAL | Status: DC | PRN

## 2023-10-13 MED ORDER — PIPERACILLIN-TAZOBACTAM 3.375 G IVPB
3.3750 g | Freq: Three times a day (TID) | INTRAVENOUS | Status: DC
  Administered 2023-10-13 – 2023-10-16 (×9): 3.375 g via INTRAVENOUS
  Filled 2023-10-13 (×9): qty 50

## 2023-10-13 MED ORDER — ACETAMINOPHEN 10 MG/ML IV SOLN
INTRAVENOUS | Status: AC
  Filled 2023-10-13: qty 100

## 2023-10-13 MED ORDER — PROPOFOL 10 MG/ML IV BOLUS
INTRAVENOUS | Status: AC
  Filled 2023-10-13: qty 20

## 2023-10-13 MED ORDER — OXYCODONE HCL 5 MG PO TABS
ORAL_TABLET | ORAL | Status: AC
  Filled 2023-10-13: qty 1

## 2023-10-13 MED ORDER — ACETAMINOPHEN 10 MG/ML IV SOLN
INTRAVENOUS | Status: DC | PRN
  Administered 2023-10-13: 1000 mg via INTRAVENOUS

## 2023-10-13 MED ORDER — MIDAZOLAM HCL 2 MG/ML PO SYRP: 8.0000 mg | ORAL_SOLUTION | Freq: Once | ORAL | Status: DC | PRN

## 2023-10-13 MED ORDER — PROPOFOL 10 MG/ML IV BOLUS
INTRAVENOUS | Status: AC
  Filled 2023-10-13: qty 40

## 2023-10-13 MED ORDER — KETAMINE HCL 50 MG/5ML IJ SOSY
PREFILLED_SYRINGE | INTRAMUSCULAR | Status: DC | PRN
Start: 2023-10-13 — End: 2023-10-13
  Administered 2023-10-13: 30 mg via INTRAVENOUS
  Administered 2023-10-13 (×2): 10 mg via INTRAVENOUS

## 2023-10-13 MED ORDER — HYDROMORPHONE HCL 1 MG/ML IJ SOLN
0.2500 mg | INTRAMUSCULAR | Status: DC | PRN
  Administered 2023-10-13 (×3): 0.5 mg via INTRAVENOUS

## 2023-10-13 MED ORDER — VASHE WOUND IRRIGATION OPTIME
TOPICAL | Status: DC | PRN
  Administered 2023-10-13: 34 [oz_av]

## 2023-10-13 MED ORDER — FENTANYL CITRATE (PF) 100 MCG/2ML IJ SOLN
INTRAMUSCULAR | Status: AC
  Filled 2023-10-13: qty 2

## 2023-10-13 SURGICAL SUPPLY — 34 items
BNDG COHESIVE 6X5 TAN ST LF (GAUZE/BANDAGES/DRESSINGS) ×1 IMPLANT
BNDG GAUZE DERMACEA FLUFF 4 (GAUZE/BANDAGES/DRESSINGS) IMPLANT
CANISTER WOUND CARE 500ML ATS (WOUND CARE) IMPLANT
CHLORAPREP W/TINT 26 (MISCELLANEOUS) IMPLANT
CLEANSER WND VASHE INSTL 34OZ (WOUND CARE) IMPLANT
DRAPE EXTREMITY 106X87X128.5 (DRAPES) IMPLANT
DRAPE INCISE IOBAN 66X45 STRL (DRAPES) IMPLANT
DRSG EMULSION OIL 3X3 NADH (GAUZE/BANDAGES/DRESSINGS) IMPLANT
DRSG VAC GRANUFOAM LG (GAUZE/BANDAGES/DRESSINGS) IMPLANT
DRSG VAC GRANUFOAM MED (GAUZE/BANDAGES/DRESSINGS) IMPLANT
ELECT REM PT RETURN 9FT ADLT (ELECTROSURGICAL) ×1 IMPLANT
ELECTRODE REM PT RTRN 9FT ADLT (ELECTROSURGICAL) ×1 IMPLANT
GAUZE PAD ABD 8X10 STRL (GAUZE/BANDAGES/DRESSINGS) IMPLANT
GAUZE SPONGE 4X4 12PLY STRL (GAUZE/BANDAGES/DRESSINGS) IMPLANT
GAUZE STRETCH 2X75IN STRL (MISCELLANEOUS) IMPLANT
GLOVE BIO SURGEON STRL SZ7 (GLOVE) ×1 IMPLANT
GLOVE SURG SYN 8.0 (GLOVE) ×1 IMPLANT
GLOVE SURG SYN 8.0 PF PI (GLOVE) ×1 IMPLANT
GOWN STRL REUS W/ TWL LRG LVL3 (GOWN DISPOSABLE) ×1 IMPLANT
GOWN STRL REUS W/ TWL XL LVL3 (GOWN DISPOSABLE) ×1 IMPLANT
KIT TURNOVER KIT A (KITS) ×1 IMPLANT
LABEL OR SOLS (LABEL) ×1 IMPLANT
MANIFOLD NEPTUNE II (INSTRUMENTS) ×1 IMPLANT
NS IRRIG 500ML POUR BTL (IV SOLUTION) ×1 IMPLANT
PACK EXTREMITY ARMC (MISCELLANEOUS) ×1 IMPLANT
PAD PREP OB/GYN DISP 24X41 (PERSONAL CARE ITEMS) ×1 IMPLANT
SOL PREP PVP 2OZ (MISCELLANEOUS) IMPLANT
SOLUTION PREP PVP 2OZ (MISCELLANEOUS) IMPLANT
SPONGE T-LAP 18X18 ~~LOC~~+RFID (SPONGE) IMPLANT
STOCKINETTE IMPERV 14X48 (MISCELLANEOUS) ×1 IMPLANT
SUT VICRYL+ 3-0 36IN CT-1 (SUTURE) IMPLANT
TRAP FLUID SMOKE EVACUATOR (MISCELLANEOUS) ×1 IMPLANT
WATER STERILE IRR 500ML POUR (IV SOLUTION) ×1 IMPLANT
YANKAUER SUCT BULB TIP NO VENT (SUCTIONS) IMPLANT

## 2023-10-13 NOTE — Anesthesia Preprocedure Evaluation (Addendum)
 Anesthesia Evaluation  Patient identified by MRN, date of birth, ID band Patient awake    Reviewed: Allergy & Precautions, NPO status , Patient's Chart, lab work & pertinent test results  History of Anesthesia Complications Negative for: history of anesthetic complications  Airway Mallampati: IV  TM Distance: >3 FB Neck ROM: Full    Dental  (+) Edentulous Upper, Edentulous Lower, Dental Advidsory Given   Pulmonary COPD,  COPD inhaler, Current Smoker and Patient abstained from smoking.   Pulmonary exam normal breath sounds clear to auscultation       Cardiovascular Exercise Tolerance: Poor (-) angina + Peripheral Vascular Disease (on Plavix)  Normal cardiovascular exam Rhythm:Regular Rate:Normal  ECG 11/07/22:  Normal sinus rhythm Minimal voltage criteria for LVH, may be normal variant ( R in aVL )  Coronary atherosclerosis on CT scan   Neuro/Psych  PSYCHIATRIC DISORDERS Anxiety Depression    Chronic pain due to neoplasm Managed by palliative care Continue home oxycodone CVA (residual left hand weakness 2018)    GI/Hepatic ,GERD  ,,(+) Hepatitis - (tx started 12/224), CLynch syndrome   Endo/Other  Hypothyroidism    Renal/GU Renal disease (CKD)   BPH Urothelial cancer    Musculoskeletal   Abdominal Normal abdominal exam  (+)   Peds  Hematology  (+) Blood dyscrasia, anemia   Anesthesia Other Findings 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; recurrent sigmoid colon cancer -stage II and multiple other comorbidities including PVD/active smoker  Past Medical History: No date: Anemia No date: Anxiety No date: ARF (acute respiratory failure) (HCC) No date: Atherosclerosis of arteries of extremities (HCC) No date: Benign prostatic hyperplasia with urinary obstruction No date: Bladder cancer (HCC) No date: BPH with obstruction/lower urinary tract symptoms No  date: Chronic viral hepatitis C (HCC) No date: Colon cancer (HCC) No date: COPD (chronic obstructive pulmonary disease) (HCC) No date: Depression No date: Dysphagia No date: Family history of colon cancer No date: Family history of kidney cancer No date: Family history of leukemia No date: Family history of prostate cancer No date: Generalized anxiety disorder No date: 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 No date: GERD (gastroesophageal reflux disease) No date: Hepatitis     Comment:  chronic hep c No date: Hydronephrosis No date: Hydronephrosis with ureteral stricture No date: Hyperlipidemia No date: Insomnia, unspecified No date: Knee pain     Comment:  Left No date: Major depressive disorder, recurrent, moderate (HCC) No date: Malignant neoplasm of colon (HCC) No date: Malnutrition (HCC) No date: Muscle weakness (generalized) No date: Nerve pain No date: Other abnormalities of gait and mobility No date: Other chronic pain No date: Peripheral vascular disease (HCC) No date: Personal history of transient cerebral ischemia No date: Pressure ulcer No date: Prostate cancer (HCC) No date: Stroke [facility]) No date: Tinea unguium No date: Tobacco user No date: Unspecified protein-calorie malnutrition (HCC) No date: Ureteral cancer, right (HCC) No date: Urinary frequency No date: Venous hypertension of both lower extremities No date: Xerosis cutis  Past Surgical History: No date: COLON SURGERY     Comment:  En bloc extended right hemicolectomy 07/2017 11/06/2020: COLONOSCOPY WITH PROPOFOL; N/A     Comment:  Procedure: COLONOSCOPY WITH PROPOFOL;  Surgeon: Wyline Mood, MD;  Location: [facility] ENDOSCOPY;  Service:               Gastroenterology;  Laterality: N/A; 07/31/2021: COLONOSCOPY WITH  PROPOFOL; N/A     Comment:  Procedure: COLONOSCOPY WITH PROPOFOL;  Surgeon: Earline Mayotte, MD;  Location: [facility] ENDOSCOPY;  Service:                Endoscopy;  Laterality: N/A; 08/30/2018: CYSTOSCOPY W/ RETROGRADES; Right     Comment:  Procedure: CYSTOSCOPY WITH RETROGRADE PYELOGRAM;                Surgeon: Vanna Scotland, MD;  Location: [facility] ORS;                Service: Urology;  Laterality: Right; 04/25/2018: CYSTOSCOPY WITH STENT PLACEMENT; Right     Comment:  Procedure: CYSTOSCOPY WITH STENT PLACEMENT;  Surgeon:               Vanna Scotland, MD;  Location: [facility] ORS;  Service:               Urology;  Laterality: Right; 08/30/2018: CYSTOSCOPY WITH STENT PLACEMENT; Right     Comment:  Procedure: CYSTOSCOPY WITH STENT Exchange;  Surgeon:               Vanna Scotland, MD;  Location: [facility] ORS;  Service:               Urology;  Laterality: Right; 03/07/2019: CYSTOSCOPY WITH STENT PLACEMENT; Right     Comment:  Procedure: CYSTOSCOPY WITH STENT Exchange;  Surgeon:               Vanna Scotland, MD;  Location: [facility] ORS;  Service:               Urology;  Laterality: Right; 11/21/2019: CYSTOSCOPY WITH STENT PLACEMENT; Right     Comment:  Procedure: CYSTOSCOPY WITH STENT Exchange;  Surgeon:               Vanna Scotland, MD;  Location: [facility] ORS;  Service:               Urology;  Laterality: Right; 05/23/2019: LOWER EXTREMITY ANGIOGRAPHY; Left     Comment:  Procedure: LOWER EXTREMITY ANGIOGRAPHY;  Surgeon: Annice Needy, MD;  Location: [facility];  Service:               Cardiovascular;  Laterality: Left; 05/30/2019: LOWER EXTREMITY ANGIOGRAPHY; Right     Comment:  Procedure: LOWER EXTREMITY ANGIOGRAPHY;  Surgeon: Annice Needy, MD;  Location: [facility];  Service:               Cardiovascular;  Laterality: Right; 02/13/2020: LOWER EXTREMITY ANGIOGRAPHY; Right     Comment:  Procedure: LOWER EXTREMITY ANGIOGRAPHY;  Surgeon: Annice Needy, MD;  Location: [facility];  Service:               Cardiovascular;  Laterality: Right; 02/20/2020: LOWER EXTREMITY ANGIOGRAPHY;  Left     Comment:  Procedure: LOWER EXTREMITY ANGIOGRAPHY;  Surgeon: Annice Needy, MD;  Location: [facility];  Service:               Cardiovascular;  Laterality: Left;  01/01/2023: LOWER EXTREMITY ANGIOGRAPHY; Left     Comment:  Procedure: Lower Extremity Angiography;  Surgeon: Annice Needy, MD;  Location: [facility];  Service:               Cardiovascular;  Laterality: Left; 02/28/2019: PORTA CATH INSERTION; N/A     Comment:  Procedure: PORTA CATH INSERTION;  Surgeon: Annice Needy,              MD;  Location: [facility];  Service:               Cardiovascular;  Laterality: N/A; 11/17/2022: ROBOT ASSISTED LAPAROSCOPIC PARTIAL COLECTOMY No date: tumor removed   BMI    Body Mass Index: 19.23 kg/m      Reproductive/Obstetrics negative OB ROS                             Anesthesia Physical Anesthesia Plan  ASA: 3  Anesthesia Plan: General   Post-op Pain Management: Ofirmev IV (intra-op)* and Toradol IV (intra-op)*   Induction: Intravenous  PONV Risk Score and Plan: 2 and Ondansetron and Dexamethasone  Airway Management Planned: Natural Airway and Nasal Cannula  Additional Equipment:   Intra-op Plan:   Post-operative Plan:   Informed Consent: I have reviewed the patients History and Physical, chart, labs and discussed the procedure including the risks, benefits and alternatives for the proposed anesthesia with the patient or authorized representative who has indicated his/her understanding and acceptance.     Dental Advisory Given  Plan Discussed with: Anesthesiologist, CRNA and Surgeon  Anesthesia Plan Comments:         Anesthesia Quick Evaluation

## 2023-10-13 NOTE — Anesthesia Procedure Notes (Signed)
 Procedure Name: LMA Insertion Date/Time: 10/13/2023 1:48 PM  Performed by: Mohammed Kindle, CRNAPre-anesthesia Checklist: Patient identified, Emergency Drugs available, Suction available and Patient being monitored Patient Re-evaluated:Patient Re-evaluated prior to induction Oxygen Delivery Method: Circle system utilized Preoxygenation: Pre-oxygenation with 100% oxygen Induction Type: IV induction LMA: LMA inserted LMA Size: 4.0 Placement Confirmation: positive ETCO2 and breath sounds checked- equal and bilateral Tube secured with: Tape Dental Injury: Teeth and Oropharynx as per pre-operative assessment

## 2023-10-13 NOTE — Plan of Care (Signed)

## 2023-10-13 NOTE — Transfer of Care (Signed)
 Immediate Anesthesia Transfer of Care Note  Patient: Chad [name]  Procedure(s) Performed: IRRIGATION AND DEBRIDEMENT WOUND (Right: Leg Lower) APPLICATION, WOUND VAC (Right: Leg Lower)  Patient Location: PACU  Anesthesia Type:General  Level of Consciousness: drowsy  Airway & Oxygen Therapy: Patient Spontanous Breathing and Patient connected to face mask oxygen  Post-op Assessment: Report given to RN and Post -op Vital signs reviewed and stable  Post vital signs: Reviewed and stable  Last Vitals:  Vitals Value Taken Time  BP 131/84 10/13/23 1439  Temp 36.1 C 10/13/23 1439  Pulse 85 10/13/23 1443  Resp 14 10/13/23 1443  SpO2 100 % 10/13/23 1443  Vitals shown include unfiled device data.  Last Pain:  Vitals:   10/13/23 1118  TempSrc: Temporal  PainSc:          Complications: No notable events documented.

## 2023-10-13 NOTE — Consult Note (Signed)
 NAME: Chad [name]  DOB: [birth year]  MRN: [id number]  Date/Time: 10/13/2023 1:52 PM  REQUESTING PROVIDER: Dr.Kumar Subjective:  REASON FOR CONSULT: BKA stump infection ? Chad [name] is a 63 y.o. with a history of urothelial ca, Colon ca s/p hemicolectomy, HCV on epclusa, left BKA Underwent rt BKA for gangrene and wounds on 09/30/23 And on 3/14 presented to the ED with popping of staples at the medial end of the stump and foul smelling discharge   10/09/23  BP 119/84  Temp 98.3 F (36.8 C)  Pulse Rate 105 !  Resp 13  SpO2 99 %    Latest Reference Range & Units 10/09/23  WBC 4.0 - 10.5 K/uL 8.3  Hemoglobin 13.0 - 17.0 g/dL 8.7 (L)  HCT 40.9 - 81.1 % 27.8 (L)  Platelets 150 - 400 K/uL 484 (H)  Creatinine 0.61 - 1.24 mg/dL 9.14   Culture sent staretd on broad spectrum Taken for surgery by vascular on 3/15 and underwent I/D Also on 3.16 he went back to the OR for furtther debridement  Superficial culture proteus, VR enterococcus fecalis and providencia Surgical cultures on 3/15 same Pt is currently on vanco/cefepime and flagyl Today he went back to OR and had wound vac application   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    GERD (gastroesophageal reflux disease)    Hepatitis    chronic hep c   Hydronephrosis    Hydronephrosis with ureteral stricture     Hyperlipidemia    Insomnia, unspecified    Knee pain    Left   Major depressive disorder, recurrent, moderate (HCC)    Malignant neoplasm of colon (HCC)    Malnutrition (HCC)    Muscle weakness (generalized)    Nerve pain    Other abnormalities of gait and mobility    Other chronic pain    Peripheral vascular disease (HCC)    Personal history of transient cerebral ischemia    Pressure ulcer    Prostate cancer (HCC)    Stroke (HCC)    Tinea unguium    Tobacco user    Unspecified protein-calorie malnutrition (HCC)    Ureteral cancer, right (HCC)    Urinary frequency    Venous hypertension of both lower extremities    Xerosis cutis     Past Surgical History:  Procedure Laterality Date   AMPUTATION Left 03/03/2023   Procedure: AMPUTATION BELOW KNEE;  Surgeon: Renford Dills, MD;  Location: [facility];  Service: Vascular;  Laterality: Left;   AMPUTATION Right 09/30/2023   Procedure: AMPUTATION OF AMPUTATION, BELOW KNEE;  Surgeon: Renford Dills, MD;  Location: [facility];  Service: Vascular;  Laterality: Right;   COLON SURGERY     En bloc extended right hemicolectomy 07/2017   COLONOSCOPY WITH  PROPOFOL N/A 11/06/2020   Procedure: COLONOSCOPY WITH PROPOFOL;  Surgeon: Wyline Mood, MD;  Location: [facility];  Service: Gastroenterology;  Laterality: N/A;   COLONOSCOPY WITH PROPOFOL N/A 07/31/2021   Procedure: COLONOSCOPY WITH PROPOFOL;  Surgeon: Earline Mayotte, MD;  Location: [facility];  Service: Endoscopy;  Laterality: N/A;   CYSTOSCOPY W/ RETROGRADES Right 08/30/2018   Procedure: CYSTOSCOPY WITH RETROGRADE PYELOGRAM;  Surgeon: Vanna Scotland, MD;  Location: [facility];  Service: Urology;  Laterality: Right;   CYSTOSCOPY WITH STENT PLACEMENT Right 04/25/2018   Procedure: CYSTOSCOPY WITH STENT PLACEMENT;  Surgeon: Vanna Scotland, MD;  Location: [facility];  Service: Urology;  Laterality: Right;   CYSTOSCOPY WITH STENT PLACEMENT Right 08/30/2018   Procedure: CYSTOSCOPY WITH  STENT Exchange;  Surgeon: Vanna Scotland, MD;  Location: [facility];  Service: Urology;  Laterality: Right;   CYSTOSCOPY WITH STENT PLACEMENT Right 03/07/2019   Procedure: CYSTOSCOPY WITH STENT Exchange;  Surgeon: Vanna Scotland, MD;  Location: [facility];  Service: Urology;  Laterality: Right;   CYSTOSCOPY WITH STENT PLACEMENT Right 11/21/2019   Procedure: CYSTOSCOPY WITH STENT Exchange;  Surgeon: Vanna Scotland, MD;  Location: [facility];  Service: Urology;  Laterality: Right;   INCISION AND DRAINAGE OF DEEP ABSCESS, KNEE Right 10/11/2023   Procedure: INCISION AND DRAINAGE OF DEEP ABSCESS, KNEE;  Surgeon: Iline Oven, MD;  Location: [facility];  Service: Vascular;  Laterality: Right;   INCISION AND DRAINAGE OF WOUND Right 10/10/2023   Procedure: IRRIGATION AND DEBRIDEMENT WOUND RIGHT BKA;  Surgeon: Iline Oven, MD;  Location: [facility];  Service: Vascular;  Laterality: Right;   LOWER EXTREMITY ANGIOGRAPHY Left 05/23/2019   Procedure: LOWER EXTREMITY ANGIOGRAPHY;  Surgeon: Annice Needy, MD;  Location: [facility];  Service: Cardiovascular;  Laterality: Left;   LOWER EXTREMITY ANGIOGRAPHY Right 05/30/2019   Procedure: LOWER EXTREMITY ANGIOGRAPHY;  Surgeon: Annice Needy, MD;  Location: [facility];  Service: Cardiovascular;  Laterality: Right;   LOWER EXTREMITY ANGIOGRAPHY Right 02/13/2020   Procedure: LOWER EXTREMITY ANGIOGRAPHY;  Surgeon: Annice Needy, MD;  Location: [facility];  Service: Cardiovascular;  Laterality: Right;   LOWER EXTREMITY ANGIOGRAPHY Left 02/20/2020   Procedure: LOWER EXTREMITY ANGIOGRAPHY;  Surgeon: Annice Needy, MD;  Location: [facility];  Service: Cardiovascular;  Laterality: Left;   LOWER EXTREMITY ANGIOGRAPHY Left 01/01/2023   Procedure: Lower Extremity Angiography;  Surgeon: Annice Needy, MD;  Location: [facility];  Service: Cardiovascular;  Laterality: Left;   PORTA CATH INSERTION N/A 02/28/2019   Procedure: PORTA CATH INSERTION;   Surgeon: Annice Needy, MD;  Location: [facility];  Service: Cardiovascular;  Laterality: N/A;   ROBOT ASSISTED LAPAROSCOPIC PARTIAL COLECTOMY  11/17/2022   tumor removed       Social History   Socioeconomic History   Marital status: Single    Spouse name: Not on file   Number of children: Not on file   Years of education: Not on file   Highest education level: Not on file  Occupational History   Not on file  Tobacco Use   Smoking status: Every Day    Current packs/day: 0.50    Types: Cigarettes    Passive exposure: Never   Smokeless tobacco: Never  Vaping Use   Vaping status: Never Used  Substance and Sexual Activity   Alcohol use: Not Currently   Drug use: Not Currently    Types: Marijuana, Methylphenidate    Comment: quit 1997-1998  ish   Sexual activity: Not Currently  Other Topics Concern   Not on file  Social History Narrative    used to live [place]; moved  To [place]- end of April 2019; in Nursing home; 1pp/day; quit alcohol. 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        Family- dad- prostate ca [at 78y]; brother- 30 died of prostate cancer; brother- 76- no cancers [[place]]; sonThayer Ohm [[place]];[name]-32y prostate ca [facility] [place]]; daughter- 82 [[place]]; another daughter 81 [NM/addict]. will refer genetics counseling. Given MSI- abnormal; highly suspicious of Lynch syndrome.  Patient's son Cristal Deer aware of high possible lynch syndrome.   Social Drivers of Corporate investment banker Strain: Not on file  Food Insecurity: No Food Insecurity (10/10/2023)   Hunger Vital Sign    Worried About Running Out of Food in the Last Year: Never true    Ran Out of Food in the Last Year: Never true  Transportation Needs: No Transportation Needs (10/10/2023)   PRAPARE - Administrator, Civil Service (Medical): No    Lack of Transportation (Non-Medical): No  Physical Activity: Not on file  Stress: Not on file  Social Connections: Moderately Integrated (10/10/2023)   Social  Connection and Isolation Panel [NHANES]    Frequency of Communication with Friends and Family: Twice a week    Frequency of Social Gatherings with Friends and Family: Twice a week    Attends Religious Services: 1 to 4 times per year    Active Member of [employer] or Organizations: No    Attends Engineer, structural: 1 to 4 times per year    Marital Status: Never married  Intimate Partner Violence: Not At Risk (10/10/2023)   Humiliation, Afraid, Rape, and Kick questionnaire    Fear of Current or Ex-Partner: No    Emotionally Abused: No    Physically Abused: No    Sexually Abused: No    Family History  Problem Relation Age of Onset   Prostate cancer Father 41   Cancer Brother 28       unsure type   Cancer Paternal Uncle        unsure type   Cancer Maternal Grandmother        unsure type   Cancer Paternal Grandmother        unsure type   Kidney cancer Paternal Grandfather    Cancer Other        unsure types   Leukemia Son    Cancer Son        other cancers, possibly colon   Allergies  Allergen Reactions   Penicillins Rash   I? Current Facility-Administered Medications  Medication Dose Route Frequency Provider Last Rate Last Admin   [MAR Hold] 0.9 %  sodium chloride infusion (Manually program via Guardrails IV Fluids)   Intravenous Once Gillis Santa, MD   Stopped at 10/11/23 1527   0.9 %  sodium chloride infusion   Intravenous Continuous Pace, Brien R, NP   Stopped at 10/13/23 0601   [MAR Hold] acetaminophen (TYLENOL) tablet 650 mg  650 mg Oral Q6H PRN Lorretta Harp, MD       [MAR Hold] albuterol (PROVENTIL) (2.5 MG/3ML) 0.083% nebulizer solution 2.5 mg  2.5 mg Inhalation Q4H PRN Lorretta Harp, MD       [facility] Hold] ascorbic acid (VITAMIN C) tablet 500 mg  500 mg Oral BID Lorretta Harp, MD   500 mg at 10/13/23 0909   [MAR Hold] atorvastatin (LIPITOR) tablet  10 mg  10 mg Oral Daily Lorretta Harp, MD   10 mg at 10/13/23 0909   [MAR Hold] calcium-vitamin D (OSCAL WITH D) 500-5 MG-MCG per  tablet 2 tablet  2 tablet Oral Daily Lorretta Harp, MD   2 tablet at 10/13/23 0909   [facility] Hold] ceFEPIme (MAXIPIME) 2 g in sodium chloride 0.9 % 100 mL IVPB  2 g Intravenous Q8H Nazari, Walid A, RPH   Stopped at 10/13/23 0537   chlorhexidine (HIBICLENS) 4 % liquid 4 Application  60 mL Topical Once Pace, Peggye Ley, NP       [MAR Hold] Chlorhexidine Gluconate Cloth 2 % PADS 6 each  6 each Topical Daily Lorretta Harp, MD   6 each at 10/13/23 1057   [MAR Hold] cyanocobalamin (VITAMIN B12) tablet 1,000 mcg  1,000 mcg Oral Daily Gillis Santa, MD   1,000 mcg at 10/13/23 0909   [MAR Hold] cyclobenzaprine (FLEXERIL) tablet 15 mg  15 mg Oral QHS Lorretta Harp, MD   15 mg at 10/12/23 2055   [MAR Hold] dextromethorphan-guaiFENesin (MUCINEX DM) 30-600 MG per 12 hr tablet 1 tablet  1 tablet Oral BID PRN Lorretta Harp, MD       [facility] Hold] escitalopram (LEXAPRO) tablet 5 mg  5 mg Oral Daily Lorretta Harp, MD   5 mg at 10/13/23 0909   [MAR Hold] Ferrous Fumarate (HEMOCYTE - 106 mg FE) tablet 106 mg of iron  106 mg of iron Oral Daily Lorretta Harp, MD   106 mg of iron at 10/13/23 0908   [MAR Hold] gabapentin (NEURONTIN) capsule 300 mg  300 mg Oral BID Lorretta Harp, MD   300 mg at 10/13/23 0909   [facility] Hold] heparin injection 5,000 Units  5,000 Units Subcutaneous Q8H Gillis Santa, MD   5,000 Units at 10/13/23 0506   [MAR Hold] mirtazapine (REMERON) tablet 7.5 mg  7.5 mg Oral QHS Lorretta Harp, MD   7.5 mg at 10/12/23 2054   [facility] Hold] morphine (PF) 2 MG/ML injection 2 mg  2 mg Intravenous Q4H PRN Lorretta Harp, MD   2 mg at 10/12/23 1021   [MAR Hold] multivitamin with minerals tablet 1 tablet  1 tablet Oral Daily Lorretta Harp, MD   1 tablet at 10/13/23 0909   [MAR Hold] nicotine (NICODERM CQ - dosed in mg/24 hours) patch 21 mg  21 mg Transdermal Daily Lorretta Harp, MD       [MAR Hold] ondansetron [facility]) injection 4 mg  4 mg Intravenous Q8H PRN Lorretta Harp, MD       [facility] Hold] oxyCODONE (Oxy IR/ROXICODONE) immediate release tablet 10-15 mg  10-15 mg  Oral Q4H PRN Lorretta Harp, MD   15 mg at 10/13/23 0925   [MAR Hold] polyvinyl alcohol (LIQUIFILM TEARS) 1.4 % ophthalmic solution 1 drop  1 drop Both Eyes Daily Lorretta Harp, MD   1 drop at 10/13/23 0910   [MAR Hold] senna-docusate (Senokot-S) tablet 1 tablet  1 tablet Oral Daily Lorretta Harp, MD   1 tablet at 10/13/23 0909   [facility] Hold] sodium chloride flush (NS) 0.9 % injection 10-40 mL  10-40 mL Intracatheter Q12H Lorretta Harp, MD   10 mL at 10/13/23 0910   [MAR Hold] sodium chloride flush (NS) 0.9 % injection 10-40 mL  10-40 mL Intracatheter PRN Lorretta Harp, MD       [MAR Hold] Sofosbuvir-Velpatasvir 400-100 MG TABS 1 tablet  1 tablet Oral Daily Lorretta Harp, MD       [facility] Hold] tamsulosin [facility])  capsule 0.4 mg  0.4 mg Oral QPC supper Lorretta Harp, MD   0.4 mg at 10/12/23 1700   [MAR Hold] vancomycin (VANCOREADY) IVPB 750 mg/150 mL  750 mg Intravenous Q12H Barrie Folk, RPH 150 mL/hr at 10/13/23 2952 Infusion Verify at 10/13/23 8413   [facility] Optime    PRN Schnier, Latina Craver, MD   34 oz at 10/13/23 1347   [MAR Hold] Vitamin D (Ergocalciferol) (DRISDOL) 1.25 MG (50000 UNIT) capsule 50,000 Units  50,000 Units Oral Q7 days Gillis Santa, MD   50,000 Units at 10/11/23 1517   Facility-Administered Medications Ordered in Other Encounters  Medication Dose Route Frequency Provider Last Rate Last Admin   fentaNYL (SUBLIMAZE) injection   Intravenous Anesthesia Intra-op Pidana, Judithann Sheen, CRNA   50 mcg at 10/13/23 1348   glycopyrrolate (ROBINUL) injection   Intravenous Anesthesia Intra-op Morene Crocker, CRNA   0.1 mg at 10/13/23 1321   heparin lock flush 100 UNIT/ML injection            ketamine 50 mg in normal saline 5 mL (10 mg/mL) syringe   Intravenous Anesthesia Intra-op Morene Crocker, CRNA   10 mg at 10/13/23 1341   lactated ringers infusion   Intravenous Continuous PRN Morene Crocker, CRNA   New Bag at 10/13/23 1314   midazolam (VERSED) injection   Intravenous Anesthesia Intra-op Morene Crocker, CRNA   2 mg at 10/13/23 1314   ondansetron (ZOFRAN) injection   Intravenous Anesthesia Intra-op Morene Crocker, CRNA   4 mg at 10/13/23 1327   propofol (DIPRIVAN) 500 MG/50ML infusion   Intravenous Anesthesia Intra-op Morene Crocker, CRNA 41.91 mL/hr at 10/13/23 1332 110 mcg/kg/min at 10/13/23 1332     Abtx:  Anti-infectives (From admission, onward)    Start     Dose/Rate Route Frequency Ordered Stop   10/12/23 1800  [MAR Hold]  vancomycin (VANCOREADY) IVPB 750 mg/150 mL        (MAR Hold since Tue 10/13/2023 at 1140.Hold Reason: Transfer to a Procedural area)   750 mg 150 mL/hr over 60 Minutes Intravenous Every 12 hours 10/12/23 1356     10/10/23 2100  vancomycin (VANCOREADY) IVPB 1500 mg/300 mL  Status:  Discontinued        1,500 mg 150 mL/hr over 120 Minutes Intravenous Every 24 hours 10/09/23 2112 10/12/23 1356   10/10/23 1400  [MAR Hold]  ceFEPIme (MAXIPIME) 2 g in sodium chloride 0.9 % 100 mL IVPB        (MAR Hold since Tue 10/13/2023 at 1140.Hold Reason: Transfer to a Procedural area)   2 g 200 mL/hr over 30 Minutes Intravenous Every 8 hours 10/10/23 1201     10/10/23 1000  [MAR Hold]  Sofosbuvir-Velpatasvir 400-100 MG TABS 1 tablet        (MAR Hold since Tue 10/13/2023 at 1140.Hold Reason: Transfer to a Procedural area)   1 tablet Oral Daily 10/10/23 0117     10/10/23 0900  ceFEPIme (MAXIPIME) 2 g in sodium chloride 0.9 % 100 mL IVPB  Status:  Discontinued        2 g 200 mL/hr over 30 Minutes Intravenous Every 12 hours 10/09/23 2112 10/10/23 1201   10/09/23 2100  ceFEPIme (MAXIPIME) 2 g in sodium chloride 0.9 % 100 mL IVPB        2 g 200 mL/hr over 30 Minutes Intravenous  Once 10/09/23 2034 10/09/23 2224   10/09/23 2100  vancomycin (VANCOREADY) IVPB 1500 mg/300 mL  1,500 mg 150 mL/hr over 120 Minutes Intravenous  Once 10/09/23 2034 10/10/23 0044       REVIEW OF SYSTEMS: pt says he wants to sleep and not be disturbed- reluctantly answer\s questions  Const: negative  fever, negative chills, negative weight loss Eyes: negative diplopia or visual changes, negative eye pain ENT: negative coryza, negative sore throat Resp: negative cough, hemoptysis, dyspnea Cards: negative for chest pain, palpitations, lower extremity edema GU: negative for frequency, dysuria and hematuria GI: Negative for abdominal pain, diarrhea, bleeding, constipation Skin: negative for rash and pruritus Heme: negative for easy bruising and gum/nose bleeding ZO:XWRUEAVW Neurolo:negative for headaches, dizziness, vertigo, memory problems  Psych:  anxiety, depression  Endocrine: negative for thyroid, diabetes Allergy/Immunology-penicillin was documented as an allergy.   Patient has taken Zosyn in 2024 without any issue   Objective:  VITALS:  BP 108/73 (BP Location: Right Arm)   Pulse 95   Temp 97.9 F (36.6 C) (Temporal)   Resp 20   SpO2 96%  LDA Port in place PHYSICAL EXAM:  General: Patient was sleeping and easily woke up.  Not very cooperative.  Oriented in place person Head: Normocephalic, without obvious abnormality, atraumatic. Eyes: Conjunctivae clear, anicteric sclerae. Pupils are equal ENT Nares normal. No drainage or sinus tenderness. Lips, mucosa, and tongue normal. No Thrush Edentulous Neck: symmetrical, no adenopathy, thyroid: non tender no carotid bruit and no JVD. Back: No CVA tenderness. Lungs: Bilateral air entry Heart: Regular rate and rhythm, no murmur, rub or gallop. Abdomen: Soft, colostomy Extremities: RT BKA       Skin: No rashes or lesions. Or bruising Lymph: Cervical, supraclavicular normal. Neurologic: Grossly non-focal Pertinent Labs Lab Results CBC    Component Value Date/Time   WBC 12.1 (H) 10/13/2023 0508   RBC 3.29 (L) 10/13/2023 0508   HGB 8.6 (L) 10/13/2023 0508   HGB 7.1 (L) 08/14/2023 0924   HCT 26.3 (L) 10/13/2023 0508   PLT 368 10/13/2023 0508   PLT 204 08/14/2023 0924   MCV 79.9 (L) 10/13/2023 0508   MCH 26.1  10/13/2023 0508   MCHC 32.7 10/13/2023 0508   RDW 18.8 (H) 10/13/2023 0508   LYMPHSABS 1.1 10/09/2023 2001   MONOABS 0.6 10/09/2023 2001   EOSABS 0.7 (H) 10/09/2023 2001   BASOSABS 0.1 10/09/2023 2001       Latest Ref Rng & Units 10/13/2023    5:08 AM 10/12/2023   11:52 AM 10/11/2023    5:08 AM  CMP  Glucose 70 - 99 mg/dL 97  098  119   BUN 8 - 23 mg/dL 19  20  24    Creatinine 0.61 - 1.24 mg/dL 1.47  8.29  5.62   Sodium 135 - 145 mmol/L 136  135  132   Potassium 3.5 - 5.1 mmol/L 3.7  4.0  4.1   Chloride 98 - 111 mmol/L 107  109  109   CO2 22 - 32 mmol/L 21  20  20    Calcium 8.9 - 10.3 mg/dL 8.9  8.7  7.8       Microbiology: Recent Results (from the past 240 hours)  Aerobic/Anaerobic Culture w Gram Stain (surgical/deep wound)     Status: None (Preliminary result)   Collection Time: 10/09/23  7:47 PM   Specimen: Leg  Result Value Ref Range Status   Specimen Description   Final    LEG Performed at [facility], [address], [place], [place] [address]    Special Requests   Final  NONE Performed at [facility], [address] [place], [place] [address]    Gram Stain   Final    FEW WBC PRESENT, PREDOMINANTLY PMN MODERATE GRAM NEGATIVE RODS FEW GRAM POSITIVE COCCI Performed at [facility], [address], [place], [place] [address]    Culture   Final    FEW PROTEUS MIRABILIS FEW PROVIDENCIA STUARTII FEW ENTEROCOCCUS FAECALIS VANCOMYCIN RESISTANT ENTEROCOCCUS ISOLATED NO ANAEROBES ISOLATED; CULTURE IN PROGRESS FOR 5 DAYS    Report Status PENDING  Incomplete   Organism ID, Bacteria PROTEUS MIRABILIS  Final   Organism ID, Bacteria ENTEROCOCCUS FAECALIS  Final      Susceptibility   Enterococcus faecalis - MIC*    AMPICILLIN <=2 SENSITIVE Sensitive     VANCOMYCIN >=32 RESISTANT Resistant     GENTAMICIN SYNERGY RESISTANT Resistant     LINEZOLID 1 SENSITIVE Sensitive     * FEW ENTEROCOCCUS FAECALIS   Proteus mirabilis - MIC*     AMPICILLIN >=32 RESISTANT Resistant     CEFEPIME <=0.12 SENSITIVE Sensitive     CEFTAZIDIME <=1 SENSITIVE Sensitive     CEFTRIAXONE <=0.25 SENSITIVE Sensitive     CIPROFLOXACIN >=4 RESISTANT Resistant     GENTAMICIN <=1 SENSITIVE Sensitive     IMIPENEM 2 SENSITIVE Sensitive     TRIMETH/SULFA >=320 RESISTANT Resistant     AMPICILLIN/SULBACTAM 4 SENSITIVE Sensitive     PIP/TAZO <=4 SENSITIVE Sensitive ug/mL    * FEW PROTEUS MIRABILIS  Blood culture (routine x 2)     Status: None (Preliminary result)   Collection Time: 10/09/23  8:01 PM   Specimen: BLOOD  Result Value Ref Range Status   Specimen Description BLOOD BLOOD LEFT ARM  Final   Special Requests   Final    BOTTLES DRAWN AEROBIC AND ANAEROBIC Blood Culture results may not be optimal due to an inadequate volume of blood received in culture bottles   Culture   Final    NO GROWTH 4 DAYS Performed at [facility], [address] [place], [place] [address]    Report Status PENDING  Incomplete  Blood culture (routine x 2)     Status: None (Preliminary result)   Collection Time: 10/09/23  8:08 PM   Specimen: BLOOD  Result Value Ref Range Status   Specimen Description BLOOD BLOOD RIGHT ARM  Final   Special Requests   Final    BOTTLES DRAWN AEROBIC AND ANAEROBIC Blood Culture results may not be optimal due to an inadequate volume of blood received in culture bottles   Culture   Final    NO GROWTH 4 DAYS Performed at [facility], [address] [place], [place] [address]    Report Status PENDING  Incomplete  Aerobic/Anaerobic Culture w Gram Stain (surgical/deep wound)     Status: None (Preliminary result)   Collection Time: 10/10/23  8:41 AM   Specimen: Wound; Abscess  Result Value Ref Range Status   Specimen Description   Final    ABSCESS Performed at [facility], [address] [place], [place] [address]    Special Requests   Final    RIGHT BELOW THE KNEE AMPUTATION Performed at [facility], [address] [place], [place] [address]    Gram Stain   Final    RARE WBC PRESENT, PREDOMINANTLY PMN ABUNDANT GRAM NEGATIVE RODS ABUNDANT GRAM POSITIVE COCCI Performed at [facility], [address] [place], [place] [address]    Culture   Final  MODERATE PROTEUS MIRABILIS CULTURE REINCUBATED FOR BETTER GROWTH NO ANAEROBES ISOLATED; CULTURE IN PROGRESS FOR 5 DAYS    Report Status PENDING  Incomplete   Organism ID, Bacteria PROTEUS MIRABILIS  Final      Susceptibility   Proteus mirabilis - MIC*    AMPICILLIN <=2 SENSITIVE Sensitive     CEFEPIME <=0.12 SENSITIVE Sensitive     CEFTAZIDIME <=1 SENSITIVE Sensitive     CEFTRIAXONE <=0.25 SENSITIVE Sensitive     CIPROFLOXACIN >=4 RESISTANT Resistant     GENTAMICIN <=1 SENSITIVE Sensitive     IMIPENEM 2 SENSITIVE Sensitive     TRIMETH/SULFA <=20 SENSITIVE Sensitive     AMPICILLIN/SULBACTAM <=2 SENSITIVE Sensitive     PIP/TAZO <=4 SENSITIVE Sensitive ug/mL    * MODERATE PROTEUS MIRABILIS    IMAGING RESULTS:  I have personally reviewed the films ?PORT in place   Impression/Recommendation ?Rt BKA stump infection s/p I/D x 3 As wound VAC Cultures grown so far Proteus, Provencher, vancomycin-resistant Enterococcus faecalis patient is currently on vancomycin and cefepime and Flagyl. DC all the 3 of them Change  Zosyn  PCR allergy documented but patient has tolerated Zosyn.   So unlikely to be a PCN allergy.   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  Patient did not want me to remove it would take a look today.  Anemia Has port  History of colon CA status post hemicolectomy has colostomy  ? ? _I have personally spent  -75 minutes--minutes involved in face-to-face and non-face-to-face activities for this patient on the day of the visit. Professional time spent includes the following activities: Preparing to see the patient  (review of tests), Obtaining and/or reviewing separately obtained history (admission/discharge record), Performing a medically appropriate examination and/or evaluation , Ordering medications/tests/procedures, referring and communicating with other health care professionals, Documenting clinical information in the EMR, Independently interpreting results (not separately reported), Communicating results to the patient/family/caregiver, Counseling and educating the patient/family/caregiver and Care coordination (not separately reported).  This involved complex and microbial treatment and counseling  ________________________________________________ Discussed with patient, requesting provider Note:  This document was prepared using Dragon voice recognition software and may include unintentional dictation errors.

## 2023-10-13 NOTE — Op Note (Signed)
    OPERATIVE NOTE   PROCEDURE: 1.  Excisional debridement right below-knee amputation stump. 2.  Application of a wound VAC to the right below-knee amputation stump  PRE-OPERATIVE DIAGNOSIS: Dehiscence with abscess formation right below-knee amputation  POST-OPERATIVE DIAGNOSIS: Same  SURGEON: [name]  ASSISTANT(S): None  ANESTHESIA: general  ESTIMATED BLOOD LOSS: 20 cc  FINDING(S): Necrotic periosteum as well as necrotic tissues and the neurovascular bundle, patchy areas of necrotic muscle fascia and skin  SPECIMEN(S): Debrided tissue was not sent to pathology  INDICATIONS:   Chad [name] is a 63 y.o. male who presents with abscess formation and dehiscence of his right below-knee amputation.  His original right below-knee amputation surgery was performed September 30, 2023.  He was discharged on October 06, 2023 postoperative day #6.  He returned to [facility] with partial dehiscence and wound infection with early sepsis.  He was taken to the operating room on March 15 for initial debridement and then subsequently redebrided on March 16.  Gauze dressing was applied followed by Kerlix and an Ace wrap at this time.  DESCRIPTION: After full informed written consent was obtained from the patient, the patient was brought back to the operating room and placed supine upon the operating table.  Prior to induction, the patient received IV antibiotics.   After obtaining adequate anesthesia, the patient was then prepped and draped in the standard fashion for a debridement of the right below-knee amputation stump.  Using both straight and curved Mayo scissors with a pair of rat-tooth forceps necrotic muscle fascia periosteum skin and subcutaneous tissues were debrided from the stump.  The neurovascular bundle just posterior to the bone also appeared to have necrotic tissue.  This was debrided sharply with scissors as well.  Fair amount of bleeding was encountered  particularly in the area of the neurovascular bundle and this was ligated with 4 or 5 interrupted figure-of-eight 3-0 Vicryl sutures.  Hemostasis was then obtained with direct pressure and Bovie cautery.  The wound was then cleansed with 1 L of Vashe.  It was then inspected for hemostasis.  Measurements were made using the provided ruler the stump measures 15 cm in width by 11 cm AP and 5 cm in depth.  This is equal to 165 cm surface area.  A black VAC sponge was then trimmed to the appropriate shape and using an Ioban the sponge secured to the stump.  Seal was in the green in the operating room.  VAC is set to 125 mmHg continuous.  The left below-knee amputation dressing was then removed there is a superficial area at the bottom of the stump.  This was dressed with Xeroform followed by an ABD followed by Curlex.   The patient tolerated this procedure well.   COMPLICATIONS: None  CONDITION: [name]  Office: [phone]   10/13/2023, 2:31 PM

## 2023-10-13 NOTE — TOC Progression Note (Signed)
 Transition of Care [facility]) - Progression Note    Patient Details  Name: Chad [name] MRN: [id number] Date of Birth: 1961-06-14  [facility] CM/SW Contact  Chapman Fitch, RN Phone Number: 10/13/2023, 4:24 PM  Clinical Narrative:     POD 0 1.  Excisional debridement right below-knee amputation stump. 2.  Application of a wound VAC to the right below-knee amputation stump  Tanya at [facility] notified that patient has vac   Expected Discharge Plan: Skilled Nursing Facility Barriers to Discharge: Continued Medical Work up  Expected Discharge Plan and Services       Living arrangements for the past 2 months: Skilled Nursing Facility                                       Social Determinants of Health (SDOH) Interventions SDOH Screenings   Food Insecurity: No Food Insecurity (10/10/2023)  Housing: Low Risk  (10/10/2023)  Transportation Needs: No Transportation Needs (10/10/2023)  Utilities: Not At Risk (10/10/2023)  Depression (PHQ2-9): Low Risk  (07/01/2023)  Social Connections: Moderately Integrated (10/10/2023)  Tobacco Use: High Risk (10/09/2023)    Readmission Risk Interventions     No data to display

## 2023-10-13 NOTE — Progress Notes (Signed)
 [facility] Progress Note  Patient: Chad [name]    OZH:[id number]  DOA: 10/09/2023     Date of Service: the patient was seen and examined on 10/13/2023  Chief Complaint  Patient presents with   Wound Check   Brief hospital course: Chad [name] is a 63 y.o. male with medical history significant of hyperlipidemia, COPD, PVD, depression with anxiety, anemia, chronic pain syndrome, MSH2 Lynch syndrome, multiple cancer (prostate cancer, urethral cancer, colon cancer), s/p of bilateral BKA, who presents with wound infection of right leg stumps.   Pt had R BKA on 3/5 due to infected right foot. The staples in the medial area of stump have popped out. Pt has foul-smelling drainage saturating the gauze.  Patient has a moderate pain, no fever or chills.  He denies chest pain, cough, SOB.  No nausea, vomiting, diarrhea or abdominal pain.  No symptoms of UTI.   Data reviewed independently and ED Course: pt was found to have WBC 8.3, GFR> 60, temperature normal, blood pressure 131/84, heart rate 100, RR 16, oxygen saturation 97% on room air.  Patient is admitted to MedSurg bed as inpatient.  Dr. [name] Cullens of [facility] is consulted.     EKG: I have personally reviewed.  Sinus rhythm, QTc 466, no obvious ischemic change.  Assessment and Plan:  # 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:  Patient does not have fever or leukocytosis.  Clinically not septic.   Consulted with Dr. [name] Cullens of [facility], who recommended IV antibiotics S/p irrigation and debridement done on 3/15 Continue vancomycin and cefepime PRN Zofran for nausea, morphine and Percocet for pain Blood cultures x 2 NGTD  - wound care consult 3/14 and 3/15 both wound culture growing Proteus mirabilis  3/14 w/cx also growing Enterococcus faecalis  3/17 ID consulted for Abx and duration of Rx 3/18 s/p Excisional debridement right below-knee amputation stump.  Application of a wound VAC to the right below-knee amputation stump. Done  today by Vasc Sx    PVD (peripheral vascular disease) (HCC) S/p Bilateral BKA -Hold Plavix for surgery -Aspirin, Lipitor 3/16 held ASA as well, due to low Hb  HLD (hyperlipidemia) -Lipitor   Chronic hepatitis C without hepatic coma (HCC): -Continue Epclusa   COPD (chronic obstructive pulmonary disease) (HCC): Stable -Bronchodilators and as needed Mucinex   Acute blood loss anemia s/p debridement of right AKA stump 3/16 Hb 5.8, s/p 1 U prbc Hb 7.1 so we will give 2nd u prbc today 3/16 held ASA as well, due to low Hb 3/18 Hb 8.6 stable Monitor HH  Iron deficiency anemia due to chronic blood loss:  Hemoglobin stable, 8.7 on admission and Hb 7.2 on 10/06/2023 3/15 Hb 7.2, Transfuse 1 u prbc  -Follow up with CBC Hb stable as above  Depression with anxiety -Continue home medications   # Hypophosphatemia, Phos repleted # Hypomagnesemia, mag repleted Monitor electrolytes and replete as needed.  # Vitamin B12 deficiency: Started oral supplement.  Follow-up PCP to repeat vitamin B12 level after 3 to 6 months.  Tobacco abuse -Nicotine patch   There is no height or weight on file to calculate BMI.  Interventions:  Diet: Regular DVT Prophylaxis: Subcutaneous Heparin    Advance goals of care discussion: DNR-Limited  Family Communication: family was not present at bedside, at the time of interview.  The pt provided permission to discuss medical plan with the family. Opportunity was given to ask question and all questions were answered satisfactorily.   Disposition:  Pt is  from SNF, admitted with Right BKA stump infection, s/p irrigation and debridement done on 3/15, and s/p repeat same procedure on 3/16, and on 3/18 excision debridement and wound VAC placed. still has pain and on IV abx, Wcx growing multiple bacteria, ID consulted, which precludes a safe discharge. Discharge to SNF, when stable and cleared by Vascular Sx.  Subjective: No significant events overnight,  complaining of pain 8/10, improved after pain medications.  Patient is aware that he would go for dressing change today by vascular surgery.   Physical Exam: General: NAD, lying comfortably Appear in no distress, affect appropriate Eyes: PERRLA ENT: Oral Mucosa Clear, moist  Neck: no JVD,  Cardiovascular: S1 and S2 Present, no Murmur,  Respiratory: good respiratory effort, Bilateral Air entry equal and Decreased, no Crackles, no wheezes Abdomen: Bowel Sound present, Soft and no tenderness,  Skin: no rashes Extremities: s/p B/L BKA, s/p Right stump debridement and irrigation Neurologic: without any new focal findings Gait not checked due to patient safety concerns  Vitals:   10/13/23 1500 10/13/23 1515 10/13/23 1530 10/13/23 1557  BP: 110/87 115/74 124/74 125/77  Pulse: 89 87 85 83  Resp: 12 15 10 18   Temp:  98 F (36.7 C) (!) 97.5 F (36.4 C) 97.8 F (36.6 C)  TempSrc:    Oral  SpO2: 100% 100% 99% 98%    Intake/Output Summary (Last 24 hours) at 10/13/2023 1637 Last data filed at 10/13/2023 1541 Gross per 24 hour  Intake 1927.28 ml  Output 720 ml  Net 1207.28 ml   There were no vitals filed for this visit.  Data Reviewed: I have personally reviewed and interpreted daily labs, tele strips, imagings as discussed above. I reviewed all nursing notes, pharmacy notes, vitals, pertinent old records I have discussed plan of care as described above with RN and patient/family.  CBC: Recent Labs  Lab 10/09/23 2001 10/10/23 0514 10/11/23 0508 10/11/23 0759 10/11/23 1144 10/11/23 1740 10/12/23 0512 10/13/23 0508  WBC 8.3 7.7 13.1* 13.1*  --   --  12.0* 12.1*  NEUTROABS 5.6  --   --   --   --   --   --   --   HGB 8.7* 7.2* 6.0* 5.8* 7.1* 8.3* 8.4* 8.6*  HCT 27.8* 24.5* 18.7* 17.7* 21.8* 25.5* 25.6* 26.3*  MCV 82.0 86.0 82.7 82.3  --   --  78.5* 79.9*  PLT 484* 437* 377 369  --   --  383 368   Basic Metabolic Panel: Recent Labs  Lab 10/09/23 1945 10/10/23 0514  10/11/23 0508 10/12/23 1152 10/13/23 0508  NA 131* 131* 132* 135 136  K 4.3 3.9 4.1 4.0 3.7  CL 105 107 109 109 107  CO2 18* 18* 20* 20* 21*  GLUCOSE 117* 87 107* 121* 97  BUN 31* 27* 24* 20 19  CREATININE 1.12 1.02 0.98 0.82 0.75  CALCIUM 8.7* 8.0* 7.8* 8.7* 8.9  MG  --   --  1.6* 1.7 1.3*  PHOS  --   --  3.3 2.3* 3.3    Studies: No results found.  Scheduled Meds:  sodium chloride   Intravenous Once   ascorbic acid  500 mg Oral BID   atorvastatin  10 mg Oral Daily   calcium-vitamin D  2 tablet Oral Daily   chlorhexidine  60 mL Topical Once   Chlorhexidine Gluconate Cloth  6 each Topical Daily   vitamin B-12  1,000 mcg Oral Daily   cyclobenzaprine  15 mg Oral QHS  escitalopram  5 mg Oral Daily   Ferrous Fumarate  106 mg of iron Oral Daily   gabapentin  300 mg Oral BID   heparin  5,000 Units Subcutaneous Q8H   mirtazapine  7.5 mg Oral QHS   multivitamin with minerals  1 tablet Oral Daily   nicotine  21 mg Transdermal Daily   polyvinyl alcohol  1 drop Both Eyes Daily   senna-docusate  1 tablet Oral Daily   sodium chloride flush  10-40 mL Intracatheter Q12H   Sofosbuvir-Velpatasvir  1 tablet Oral Daily   tamsulosin  0.4 mg Oral QPC supper   Vitamin D (Ergocalciferol)  50,000 Units Oral Q7 days   Continuous Infusions:  piperacillin-tazobactam (ZOSYN)  IV     PRN Meds: acetaminophen, albuterol, dextromethorphan-guaiFENesin, diphenhydrAMINE, famotidine, methylPREDNISolone (SOLU-MEDROL) injection, midazolam, morphine injection, ondansetron (ZOFRAN) IV, oxyCODONE, sodium chloride flush  Time spent: 40 minutes  Author: Gillis Santa. MD [facility] 10/13/2023 4:37 PM  To reach On-call, see care teams to locate the attending and reach out to them via [email or web address]. If 7PM-7AM, please contact night-coverage If you still have difficulty reaching the attending provider, please page the [facility] (Director on Call) for [facility] on amion for assistance.

## 2023-10-13 NOTE — Anesthesia Procedure Notes (Signed)
 Procedure Name: LMA Insertion Date/Time: 10/13/2023 1:48 PM  Performed by: Morene Crocker, CRNAPre-anesthesia Checklist: Patient identified, Patient being monitored, Timeout performed, Emergency Drugs available and Suction available Patient Re-evaluated:Patient Re-evaluated prior to induction Oxygen Delivery Method: Circle system utilized Preoxygenation: Pre-oxygenation with 100% oxygen Induction Type: IV induction Ventilation: Mask ventilation without difficulty LMA: LMA inserted LMA Size: 4.0 Tube type: Oral Number of attempts: 1 Placement Confirmation: positive ETCO2 and breath sounds checked- equal and bilateral Tube secured with: Tape Dental Injury: Teeth and Oropharynx as per pre-operative assessment  Comments: Smooth atraumatic LMA placement, no complications noted.

## 2023-10-14 ENCOUNTER — Encounter: Payer: Self-pay | Admitting: Vascular Surgery

## 2023-10-14 DIAGNOSIS — T148XXA Other injury of unspecified body region, initial encounter: Secondary | ICD-10-CM | POA: Diagnosis not present

## 2023-10-14 DIAGNOSIS — L089 Local infection of the skin and subcutaneous tissue, unspecified: Secondary | ICD-10-CM | POA: Diagnosis not present

## 2023-10-14 DIAGNOSIS — T8740 Infection of amputation stump, unspecified extremity: Secondary | ICD-10-CM

## 2023-10-14 DIAGNOSIS — B182 Chronic viral hepatitis C: Secondary | ICD-10-CM | POA: Diagnosis not present

## 2023-10-14 DIAGNOSIS — T8743 Infection of amputation stump, right lower extremity: Secondary | ICD-10-CM | POA: Diagnosis not present

## 2023-10-14 DIAGNOSIS — J449 Chronic obstructive pulmonary disease, unspecified: Secondary | ICD-10-CM | POA: Diagnosis not present

## 2023-10-14 DIAGNOSIS — I739 Peripheral vascular disease, unspecified: Secondary | ICD-10-CM | POA: Diagnosis not present

## 2023-10-14 LAB — CULTURE, BLOOD (ROUTINE X 2)
Culture: NO GROWTH
Culture: NO GROWTH

## 2023-10-14 LAB — BASIC METABOLIC PANEL
Anion gap: 7 (ref 5–15)
BUN: 18 mg/dL (ref 8–23)
CO2: 21 mmol/L — ABNORMAL LOW (ref 22–32)
Calcium: 9 mg/dL (ref 8.9–10.3)
Chloride: 103 mmol/L (ref 98–111)
Creatinine, Ser: 0.87 mg/dL (ref 0.61–1.24)
GFR, Estimated: >60 mL/min (ref >60)
Glucose, Bld: 81 mg/dL (ref 70–99)
Potassium: 4 mmol/L (ref 3.5–5.1)
Sodium: 131 mmol/L — ABNORMAL LOW (ref 135–145)

## 2023-10-14 LAB — AEROBIC/ANAEROBIC CULTURE W GRAM STAIN (SURGICAL/DEEP WOUND): Culture: NO GROWTH

## 2023-10-14 LAB — CBC
HCT: 26.4 % — ABNORMAL LOW (ref 39.0–52.0)
Hemoglobin: 8.4 g/dL — ABNORMAL LOW (ref 13.0–17.0)
MCH: 25.3 pg — ABNORMAL LOW (ref 26.0–34.0)
MCHC: 31.8 g/dL (ref 30.0–36.0)
MCV: 79.5 fL — ABNORMAL LOW (ref 80.0–100.0)
Platelets: 362 10*3/uL (ref 150–400)
RBC: 3.32 MIL/uL — ABNORMAL LOW (ref 4.22–5.81)
RDW: 19 % — ABNORMAL HIGH (ref 11.5–15.5)
WBC: 14.9 10*3/uL — ABNORMAL HIGH (ref 4.0–10.5)
nRBC: 0 % (ref 0.0–0.2)

## 2023-10-14 LAB — MAGNESIUM: Magnesium: 2.4 mg/dL (ref 1.7–2.4)

## 2023-10-14 LAB — PHOSPHORUS: Phosphorus: 3.5 mg/dL (ref 2.5–4.6)

## 2023-10-14 NOTE — Progress Notes (Signed)
  Progress Note   Patient: Chad [name] [id number]:[id number] DOB: June 08, 1961 DOA: 10/09/2023     5 DOS: the patient was seen and examined on 10/14/2023   Brief hospital course: Chad [name] is a 63 y.o. male with medical history significant of hyperlipidemia, COPD, PVD, depression with anxiety, anemia, chronic pain syndrome, MSH2 Lynch syndrome, multiple cancer (prostate cancer, urethral cancer, colon cancer), s/p of bilateral BKA, who presents with wound infection of right leg stumps.  Debridement was performed by vascular surgery on 3/18.  Currently on Zosyn and vancomycin per ID. Final culture results still pending.   Principal Problem:   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 Active Problems:   PVD (peripheral vascular disease) (HCC)   HLD (hyperlipidemia)   Chronic hepatitis C without hepatic coma (HCC)   COPD (chronic obstructive pulmonary disease) (HCC)   Iron deficiency anemia due to chronic blood loss   Depression with anxiety   Tobacco abuse   Assessment and Plan: # 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:  PVD (peripheral vascular disease) (HCC) S/p Bilateral BKA Patient does not have fever or leukocytosis.  Clinically not septic.   Consulted with Dr. [name] Cullens of [facility], who recommended IV antibiotics S/p irrigation and debridement done on 3/15 Seen by ID, final culture results still pending.  Currently covered with Zosyn and vancomycin.  Tobacco abuse. Patient is not compliant with cessation of tobacco products.  He was caught vaping in the room.   HLD (hyperlipidemia) -Lipitor   Chronic hepatitis C without hepatic coma (HCC): -Continue Epclusa   COPD (chronic obstructive pulmonary disease) (HCC): Stable -Bronchodilators and as needed Mucinex   Acute blood loss anemia s/p debridement of right AKA stump Iron deficient anemia. B12 deficient anemia. Received 3 units of PRBC, hemoglobin has been stable now. B12 and iron supplement.     Depression with anxiety -Continue home medications   # Hypophosphatemia, # Hypomagnesemia Hyponatremia. Multiple acidosis. Monitor electrolytes and replete as needed.   # Vitamin B12 deficiency: Started oral supplement.  Follow-up PCP to repeat vitamin B12 level after 3 to 6 months.       Subjective:  Patient currently has no complaints.  Physical Exam: Vitals:   10/13/23 2100 10/14/23 0438 10/14/23 0852 10/14/23 1227  BP: 127/77 107/75 98/68 99/64   Pulse: 85 83 96   Resp: 16 16 14    Temp: 97.8 F (36.6 C) 98.1 F (36.7 C) 98.5 F (36.9 C)   TempSrc:  Oral Oral   SpO2: 98% 100% 98%    General exam: Appears calm and comfortable  Respiratory system: Clear to auscultation. Respiratory effort normal. Cardiovascular system: S1 & S2 heard, RRR. No JVD, murmurs, rubs, gallops or clicks. No pedal edema. Gastrointestinal system: Abdomen is nondistended, soft and nontender. No organomegaly or masses felt. Normal bowel sounds heard. Central nervous system: Alert and oriented. No focal neurological deficits. Extremities: Bilateral BKA. Skin: No rashes, lesions or ulcers Psychiatry: Judgement and insight appear normal. Mood & affect appropriate.     Data Reviewed:  Lab results reviewed.  Family Communication: None  Disposition: Status is: Inpatient Remains inpatient appropriate because: Severity of disease, and VAC changes on Friday, may discontinue to SNF after.     Time spent: 35 minutes  Author: Marrion Coy, MD 10/14/2023 12:51 PM  For on call review [email or web address].

## 2023-10-14 NOTE — Progress Notes (Signed)
 [facility] Hospital Liaison note  This patient is currently followed by [facility] outpatient palliative care services. [facility] will follow through discharge disposition.  Please call with any palliative care questions   [facility] Liaison [phone]

## 2023-10-14 NOTE — Progress Notes (Addendum)
 At the time of bedside shift report, pt was noted to have a cloud in his room. Pt found with vape in his hand. Vape was placed in his bin until discharge and patient educated on hospital policy. Pt verbalizes understanding but states he is not happy to have the vape removed. CN notified. MD notified.

## 2023-10-14 NOTE — Hospital Course (Signed)
 Chad [name] is a 63 y.o. male with medical history significant of hyperlipidemia, COPD, PVD, depression with anxiety, anemia, chronic pain syndrome, MSH2 Lynch syndrome, multiple cancer (prostate cancer, urethral cancer, colon cancer), s/p of bilateral BKA, who presents with wound infection of right leg stumps.  Debridement was performed by vascular surgery on 3/18.  Currently on Zosyn and vancomycin per ID. Final culture results still pending.

## 2023-10-14 NOTE — TOC Progression Note (Addendum)
 Transition of Care [facility]) - Progression Note    Patient Details  Name: Chad [name] MRN: [id number] Date of Birth: September 07, 1960  [facility]) CM/SW Contact  Chapman Fitch, RN Phone Number: 10/14/2023, 11:32 AM  Clinical Narrative:    Patient now with wound vac. Message sent to MD to determine anticipated dc date in order for [facility] to order a wound vac  Per SDOH utility resources added to AVS  Update:  Per PA anticipated dc Friday.  Kenney Houseman at [facility] notified in order to obtain wound vac Expected Discharge Plan: Skilled Nursing Facility Barriers to Discharge: Continued Medical Work up  Expected Discharge Plan and Services       Living arrangements for the past 2 months: Skilled Nursing Facility                                       Social Determinants of Health (SDOH) Interventions SDOH Screenings   Food Insecurity: No Food Insecurity (10/13/2023)  Housing: Low Risk  (10/13/2023)  Transportation Needs: No Transportation Needs (10/13/2023)  Utilities: At Risk (10/13/2023)  Depression (PHQ2-9): Low Risk  (07/01/2023)  Social Connections: Moderately Integrated (10/10/2023)  Tobacco Use: High Risk (10/09/2023)    Readmission Risk Interventions     No data to display

## 2023-10-14 NOTE — Progress Notes (Signed)
 Pt linens changed due to being soiled. Pt stating "I don't want my fucking bed changed". Pt educated on need to changed bed for cleanliness, and dressings changed. Pt stated "leave me the fuck alone". Pt educated on need to change dressings per protocol. Pt stated "Fuck your protocol, it don't apply to me". Pt asking for his vape back. Pt re-educated on the Tobacco policy. Pt stated "The policy don't apply to me". Pt educated again. Pulled up in bed, locked and low, call bell in reach.

## 2023-10-14 NOTE — Plan of Care (Signed)

## 2023-10-14 NOTE — Progress Notes (Signed)
  Progress Note    10/14/2023 12:51 PM 1 Day Post-Op  Subjective:  Chad [name] is a 63 year old male who presents to [facility] with right BKA stump infection. He was taken to the operating room twice over the weekend by Dr. Chrisandra Carota for incisional drainage and washout and again yesterday. Patient is recovering as expected. No complaints overnight and vitals all remain stable.    Vitals:   10/14/23 0852 10/14/23 1227  BP: 98/68 99/64  Pulse: 96   Resp: 14   Temp: 98.5 F (36.9 C)   SpO2: 98%    Physical Exam: Cardiac:  RRR, Normal S1, S2. No murmurs. Lungs:  Non labored breathing, Rhonchi on auscultation. No rales or wheezing.  Incisions:  Right lower extremity BKA now with wound vac. Working well.  Extremities:  Right lower extremity BKA. Dressing clean dry and intact. Wound vac working well.   Abdomen:  Positive bowel sounds throughout, Soft, non tender and non distended.  Neurologic: AAOX3. Refusing to communicate today. Flat affect.  CBC    Component Value Date/Time   WBC 14.9 (H) 10/14/2023 0528   RBC 3.32 (L) 10/14/2023 0528   HGB 8.4 (L) 10/14/2023 0528   HGB 7.1 (L) 08/14/2023 0924   HCT 26.4 (L) 10/14/2023 0528   PLT 362 10/14/2023 0528   PLT 204 08/14/2023 0924   MCV 79.5 (L) 10/14/2023 0528   MCH 25.3 (L) 10/14/2023 0528   MCHC 31.8 10/14/2023 0528   RDW 19.0 (H) 10/14/2023 0528   LYMPHSABS 1.1 10/09/2023 2001   MONOABS 0.6 10/09/2023 2001   EOSABS 0.7 (H) 10/09/2023 2001   BASOSABS 0.1 10/09/2023 2001    BMET    Component Value Date/Time   NA 131 (L) 10/14/2023 0528   K 4.0 10/14/2023 0528   CL 103 10/14/2023 0528   CO2 21 (L) 10/14/2023 0528   GLUCOSE 81 10/14/2023 0528   BUN 18 10/14/2023 0528   CREATININE 0.87 10/14/2023 0528   CREATININE 1.09 08/14/2023 0924   CREATININE 1.07 07/01/2023 1359   CALCIUM 9.0 10/14/2023 0528   GFRNONAA >60 10/14/2023 0528   GFRNONAA >60 08/14/2023 0924   GFRAA >60 04/19/2020 0858    INR    Component Value  Date/Time   INR 1.1 10/10/2023 0514     Intake/Output Summary (Last 24 hours) at 10/14/2023 1251 Last data filed at 10/14/2023 0856 Gross per 24 hour  Intake 1440 ml  Output 1700 ml  Net -260 ml     Assessment/Plan:  63 y.o. male is s/p Right BKA I&D with Wound Vac Placement  1 Day Post-Op   PLAN Pain medication PRN OOB To Chair Next Vac Change on Friday 10/16/23 with wound care at 8:00 AM and Vascular Surgery. Patient will be cleared for discharge immediately afterwards. Patient will need wound vac change twice weekly. Return to clinic 2 weeks post discharge for wound check.     DVT prophylaxis:  Heparin 5000 units subcu every 8 hours.    Marcie [facility] 10/14/2023 12:51 PM

## 2023-10-14 NOTE — Progress Notes (Addendum)
 Date of Admission:  10/09/2023      ID: Chad [name] is a 63 y.o. 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 with a history of urothelial ca, Colon ca s/p hemicolectomy, HCV on epclusa, left BKA Underwent rt BKA for gangrene and wounds on 09/30/23 And on 3/14 presented to the ED with popping of staples at the medial end of the stump and foul smelling discharge Taken for surgery by vascular on 3/15 and underwent I/D Also on 3.16 he went back to the OR for furtther debridement   Superficial culture proteus, VR enterococcus fecalis and providencia Surgical cultures on 3/15 same Pt is currently on vanco/cefepime and flagyl Today he went back to OR and had wound vac application Subjective: Says he is doing okay  Medications:   sodium chloride   Intravenous Once   ascorbic acid  500 mg Oral BID   atorvastatin  10 mg Oral Daily   calcium-vitamin D  2 tablet Oral Daily   chlorhexidine  60 mL Topical Once   Chlorhexidine Gluconate Cloth  6 each Topical Daily   vitamin B-12  1,000 mcg Oral Daily   cyclobenzaprine  15 mg Oral QHS   escitalopram  5 mg Oral Daily   Ferrous Fumarate  106 mg of iron Oral Daily   gabapentin  300 mg Oral BID   heparin  5,000 Units Subcutaneous Q8H   mirtazapine  7.5 mg Oral QHS   multivitamin with minerals  1 tablet Oral Daily   nicotine  21 mg Transdermal Daily   polyvinyl alcohol  1 drop Both Eyes Daily   senna-docusate  1 tablet Oral Daily   sodium chloride flush  10-40 mL Intracatheter Q12H   Sofosbuvir-Velpatasvir  1 tablet Oral Daily   tamsulosin  0.4 mg Oral QPC supper   Vitamin D (Ergocalciferol)   50,000 Units Oral Q7 days    Objective: Vital signs in last 24 hours: Patient Vitals for the past 24 hrs:  BP Temp Temp src Pulse Resp SpO2  10/14/23 1227 99/64 -- -- -- -- --  10/14/23 0852 98/68 98.5 F (36.9 C) Oral 96 14 98 %  10/14/23 0438 107/75 98.1 F (36.7 C) Oral 83 16 100 %  10/13/23 2100 127/77 97.8 F (36.6 C) -- 85 16 98 %  10/13/23 1557 125/77 97.8 F (36.6 C) Oral 83 18 98 %  10/13/23 1530 124/74 (!) 97.5 F (36.4 C) -- 85 10 99 %  10/13/23 1515 115/74 98 F (36.7 C) -- 87 15 100 %  10/13/23 1500 110/87 -- -- 89 12 100 %  10/13/23 1445 128/85 -- -- 87 14 100 %  10/13/23 1439 131/84 (!) 96.9 F (36.1 C) -- 82 11 100 %     LDA port  PHYSICAL EXAM:  General: Alert, cooperative, no distress, appears stated age.  Lungs: Clear to auscultation bilaterally. No Wheezing or Rhonchi. No rales. Heart: Regular rate and rhythm, no murmur, rub or gallop. Abdomen: Soft, colostomy Extremities: rt BKA site has wound vac Left BKA Skin: No rashes or lesions. Or bruising Lymph: Cervical, supraclavicular normal. Neurologic: Grossly non-focal  Lab Results  Latest Ref Rng & Units 10/14/2023    5:28 AM 10/13/2023    5:08 AM 10/12/2023    5:12 AM  CBC  WBC 4.0 - 10.5 K/uL 14.9  12.1  12.0   Hemoglobin 13.0 - 17.0 g/dL 8.4  8.6  8.4   Hematocrit 39.0 - 52.0 % 26.4  26.3  25.6   Platelets 150 - 400 K/uL 362  368  383        Latest Ref Rng & Units 10/14/2023    5:28 AM 10/13/2023    5:08 AM 10/12/2023   11:52 AM  CMP  Glucose 70 - 99 mg/dL 81  97  161   BUN 8 - 23 mg/dL 18  19  20    Creatinine 0.61 - 1.24 mg/dL 0.96  0.45  4.09   Sodium 135 - 145 mmol/L 131  136  135   Potassium 3.5 - 5.1 mmol/L 4.0  3.7  4.0   Chloride 98 - 111 mmol/L 103  107  109   CO2 22 - 32 mmol/L 21  21  20    Calcium 8.9 - 10.3 mg/dL 9.0  8.9  8.7       Microbiology: Rt BKA site culture- pseudomonas, proteus and efecalis    Assessment/Plan: Rt BKA stump infection s/p I/D x 3 hAs wound  VAC Cultures so far Proteus, pseudomonas, vancomycin-resistant Enterococcus faecalis patient is currently on zosyn Need to be on IV as no PO option for 2-4 weeks   PCR allergy documented but patient has tolerated Zosyn.   So unlikely to be a PCN allergy.     Recent RT BKA on 09/30/23 for gangrene rt foot and multiple wounds   HCV on epclusa Left BKA is covered with dressing.  Patient did not want me to remove it    Anemia Has port   History of colon CA status post hemicolectomy has colostomy   Discussed the management with the patient

## 2023-10-14 NOTE — Discharge Instructions (Signed)
 Agency Name: [facility] Address: [address] [place], [place] [address] Phone: [phone] Website: [email or web address] Service(s) Offered: Housing services, self-sufficiency, congregate meal program, and individual development account program.  Agency Name: [employer] [facility] Address: [address] [place], [place] [address] Phone: [phone] Email: [email or web address] Website: [email or web address] Service(s) Offered: Housing the homeless, feeding the hungry, Company secretary, job and education related services.  Agency Name: [facility] Address: [address], [place], [place] [address] Phone: [phone] Email: [email or web address] Service(s) Offered: Counseling, problem pregnancy, advocacy for Hispanics, limited emergency financial assistance.  Agency Name: [employer] Address: [address] [place], [place] [address] Phone: [phone] Website: [email or web address] Service(s) Offered: Child support services; child welfare services; SNAP; Medicaid; work first family assistance; and aid with fuel,  rent, food and medicine.  Agency Name: [employer] Address: [address], [place], [place] [address] Phone: [phone] or [phone] Email: [email or web address] Service(s) Offered: Family services and transient assistance; emergency food, fuel, clothing, limited furniture, utilities; budget counseling, general counseling; give a kid a coat; thrift store; Christmas food and toys. Utility assistance, food pantry, rental  assistance, life sustaining medicine

## 2023-10-14 NOTE — Anesthesia Postprocedure Evaluation (Signed)
 Anesthesia Post Note  Patient: Chad [name]  Procedure(s) Performed: IRRIGATION AND DEBRIDEMENT WOUND (Right: Leg Lower) APPLICATION, WOUND VAC (Right: Leg Lower)  Patient location during evaluation: PACU Anesthesia Type: General Level of consciousness: awake and alert Pain management: pain level controlled Vital Signs Assessment: post-procedure vital signs reviewed and stable Respiratory status: spontaneous breathing, nonlabored ventilation and respiratory function stable Cardiovascular status: blood pressure returned to baseline and stable Postop Assessment: no apparent nausea or vomiting Anesthetic complications: no   No notable events documented.   Last Vitals:  Vitals:   10/13/23 2100 10/14/23 0438  BP: 127/77 107/75  Pulse: 85 83  Resp: 16 16  Temp: 36.6 C 36.7 C  SpO2: 98% 100%    Last Pain:  Vitals:   10/14/23 0612  TempSrc:   PainSc: Asleep                 Foye Deer

## 2023-10-15 ENCOUNTER — Ambulatory Visit: Payer: Medicaid Other | Admitting: Infectious Diseases

## 2023-10-15 DIAGNOSIS — T148XXA Other injury of unspecified body region, initial encounter: Secondary | ICD-10-CM | POA: Diagnosis not present

## 2023-10-15 DIAGNOSIS — L089 Local infection of the skin and subcutaneous tissue, unspecified: Secondary | ICD-10-CM | POA: Diagnosis not present

## 2023-10-15 DIAGNOSIS — J449 Chronic obstructive pulmonary disease, unspecified: Secondary | ICD-10-CM | POA: Diagnosis not present

## 2023-10-15 DIAGNOSIS — T8740 Infection of amputation stump, unspecified extremity: Secondary | ICD-10-CM

## 2023-10-15 LAB — MAGNESIUM: Magnesium: 1.7 mg/dL (ref 1.7–2.4)

## 2023-10-15 LAB — BASIC METABOLIC PANEL
Anion gap: 7 (ref 5–15)
BUN: 21 mg/dL (ref 8–23)
CO2: 23 mmol/L (ref 22–32)
Calcium: 8.9 mg/dL (ref 8.9–10.3)
Chloride: 108 mmol/L (ref 98–111)
Creatinine, Ser: 1.1 mg/dL (ref 0.61–1.24)
GFR, Estimated: >60 mL/min (ref >60)
Glucose, Bld: 93 mg/dL (ref 70–99)
Potassium: 4 mmol/L (ref 3.5–5.1)
Sodium: 134 mmol/L — ABNORMAL LOW (ref 135–145)

## 2023-10-15 LAB — CBC
HCT: 26.5 % — ABNORMAL LOW (ref 39.0–52.0)
Hemoglobin: 8.5 g/dL — ABNORMAL LOW (ref 13.0–17.0)
MCH: 26.2 pg (ref 26.0–34.0)
MCHC: 32.1 g/dL (ref 30.0–36.0)
MCV: 81.5 fL (ref 80.0–100.0)
Platelets: 348 10*3/uL (ref 150–400)
RBC: 3.25 MIL/uL — ABNORMAL LOW (ref 4.22–5.81)
RDW: 18.8 % — ABNORMAL HIGH (ref 11.5–15.5)
WBC: 12.8 10*3/uL — ABNORMAL HIGH (ref 4.0–10.5)
nRBC: 0 % (ref 0.0–0.2)

## 2023-10-15 LAB — PHOSPHORUS: Phosphorus: 3.2 mg/dL (ref 2.5–4.6)

## 2023-10-15 MED ORDER — MAGNESIUM SULFATE 2 GM/50ML IV SOLN
2.0000 g | Freq: Once | INTRAVENOUS | Status: AC
  Administered 2023-10-15: 2 g via INTRAVENOUS
  Filled 2023-10-15: qty 50

## 2023-10-15 NOTE — Progress Notes (Signed)
 Progress Note   Patient: PADEN SENGER ZOX:[id number] DOB: 02-16-1961 DOA: 10/09/2023     6 DOS: the patient was seen and examined on 10/15/2023   Brief hospital course: TANAY MISURACA is a 63 y.o. male with medical history significant of hyperlipidemia, COPD, PVD, depression with anxiety, anemia, chronic pain syndrome, MSH2 Lynch syndrome, multiple cancer (prostate cancer, urethral cancer, colon cancer), s/p of bilateral BKA, who presents with wound infection of right leg stumps.  Debridement was performed by vascular surgery on 3/18.  Currently on Zosyn and vancomycin per ID. Final culture results still pending.   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  Clinically not septic.   Consulted with Dr. [name] Cullens of [facility], who recommended IV antibiotics S/p irrigation and debridement done on 3/15 Seen by ID, final culture results still pending.  Currently covered with Zosyn and vancomycin. Culture growth for different species, including Pseudomonas and Proteus.  Final culture results still pending.  Will follow results and adjust antibiotics. Patient is going back to the OR tomorrow for wound VAC change.  If culture results available tomorrow, patient can be transferred back to nursing home.   Tobacco abuse. Patient is not compliant with cessation of tobacco products.  He was caught vaping in the room.   HLD (hyperlipidemia) -Lipitor   Chronic hepatitis C  without hepatic coma (HCC): -Continue Epclusa   COPD (chronic obstructive pulmonary disease) (HCC): Stable -Bronchodilators and as needed Mucinex   Acute blood loss anemia s/p debridement of right AKA stump Iron deficient anemia. B12 deficient anemia. Received 3 units of PRBC, hemoglobin has been stable now. B12 and iron supplement.     Depression with anxiety -Continue home medications   # Hypophosphatemia, # Hypomagnesemia Hyponatremia. Metabolic acidosis. Patient was improved, magnesium 1.7, give 2 g magnesium sulfate.   # Vitamin B12 deficiency: Started oral supplement.  Follow-up PCP to repeat vitamin B12 level after 3 to 6 months.      Subjective:  Doing well today, no significant pain  Physical Exam: Vitals:   10/14/23 1227 10/14/23 1600 10/14/23 1955 10/15/23 0413  BP: 99/64 91/63 94/62  95/70  Pulse:  98 (!) 51 73  Resp:  16 16 18   Temp:  99 F (37.2 C) 99.1 F (37.3 C) 98.2 F (36.8 C)  TempSrc:  Oral Oral Oral  SpO2:  99% 90% 100%   General exam: Appears calm and comfortable  Respiratory system: Clear to auscultation. Respiratory effort normal. Cardiovascular system: S1 & S2 heard, RRR. No JVD, murmurs, rubs, gallops or clicks. No pedal edema. Gastrointestinal system: Abdomen is nondistended, soft and nontender. No organomegaly or masses felt. Normal bowel sounds heard. Central nervous system: Alert and oriented. No focal neurological deficits. Extremities: Bilateral BKA. Skin: No rashes, lesions or ulcers Psychiatry: Judgement and insight appear normal. Mood & affect appropriate.    Data Reviewed:  Is also reviewed.  Family Communication: None  Disposition: Status is: Inpatient Remains inpatient appropriate because: Severity of disease, IV treatment,  inpatient procedure.     Time spent: 35 minutes  Author: Marrion Coy, MD 10/15/2023 12:32 PM  For on call review [email or web address].

## 2023-10-15 NOTE — Plan of Care (Signed)

## 2023-10-15 NOTE — Progress Notes (Signed)
  Progress Note    10/15/2023 2:28 PM 2 Days Post-Op  Subjective:  Chad [name] is a 63 year old male who presents to [facility] with right BKA stump infection. He was taken to the operating room twice over the weekend by Dr. Chrisandra Carota for incisional drainage and washout and again on 10/13/23. Patient is recovering as expected. No complaints overnight and vitals all remain stable.    Vitals:   10/14/23 1955 10/15/23 0413  BP: 94/62 95/70  Pulse: (!) 51 73  Resp: 16 18  Temp: 99.1 F (37.3 C) 98.2 F (36.8 C)  SpO2: 90% 100%   Physical Exam: Cardiac:  RRR, Normal S1, S2. No murmurs. Lungs:  Non labored breathing, Rhonchi on auscultation. No rales or wheezing.  Incisions:  Right lower extremity BKA now with wound vac. Working well.  Extremities:  Right lower extremity BKA. Dressing clean dry and intact. Wound vac working well.   Abdomen:  Positive bowel sounds throughout, Soft, non tender and non distended.  Neurologic: AAOX3. Refusing to communicate today. Flat affect.  CBC    Component Value Date/Time   WBC 12.8 (H) 10/15/2023 0553   RBC 3.25 (L) 10/15/2023 0553   HGB 8.5 (L) 10/15/2023 0553   HGB 7.1 (L) 08/14/2023 0924   HCT 26.5 (L) 10/15/2023 0553   PLT 348 10/15/2023 0553   PLT 204 08/14/2023 0924   MCV 81.5 10/15/2023 0553   MCH 26.2 10/15/2023 0553   MCHC 32.1 10/15/2023 0553   RDW 18.8 (H) 10/15/2023 0553   LYMPHSABS 1.1 10/09/2023 2001   MONOABS 0.6 10/09/2023 2001   EOSABS 0.7 (H) 10/09/2023 2001   BASOSABS 0.1 10/09/2023 2001    BMET    Component Value Date/Time   NA 134 (L) 10/15/2023 0553   K 4.0 10/15/2023 0553   CL 108 10/15/2023 0553   CO2 23 10/15/2023 0553   GLUCOSE 93 10/15/2023 0553   BUN 21 10/15/2023 0553   CREATININE 1.10 10/15/2023 0553   CREATININE 1.09 08/14/2023 0924   CREATININE 1.07 07/01/2023 1359   CALCIUM 8.9 10/15/2023 0553   GFRNONAA >60 10/15/2023 0553   GFRNONAA >60 08/14/2023 0924   GFRAA >60 04/19/2020 0858    INR     Component Value Date/Time   INR 1.1 10/10/2023 0514     Intake/Output Summary (Last 24 hours) at 10/15/2023 1428 Last data filed at 10/15/2023 0601 Gross per 24 hour  Intake 600 ml  Output 1300 ml  Net -700 ml     Assessment/Plan:  63 y.o. male is s/p Right BKA I&D with Wound Vac Placement  2 Days Post-Op   PLAN Pain medication PRN OOB To Chair Next Vac Change on Friday 10/16/23 with wound care at 8:00 AM and Vascular Surgery. Patient will be cleared for discharge immediately afterwards. Patient will need wound vac change twice weekly. Return to clinic 2 weeks post discharge for wound check.   DVT prophylaxis:  Heparin 5000 units subcu every 8 hours.    Marcie [facility] 10/15/2023 2:28 PM

## 2023-10-16 DIAGNOSIS — B965 Pseudomonas (aeruginosa) (mallei) (pseudomallei) as the cause of diseases classified elsewhere: Secondary | ICD-10-CM | POA: Diagnosis not present

## 2023-10-16 DIAGNOSIS — B952 Enterococcus as the cause of diseases classified elsewhere: Secondary | ICD-10-CM | POA: Diagnosis not present

## 2023-10-16 DIAGNOSIS — I96 Gangrene, not elsewhere classified: Secondary | ICD-10-CM

## 2023-10-16 DIAGNOSIS — T8743 Infection of amputation stump, right lower extremity: Secondary | ICD-10-CM

## 2023-10-16 DIAGNOSIS — L089 Local infection of the skin and subcutaneous tissue, unspecified: Secondary | ICD-10-CM | POA: Diagnosis not present

## 2023-10-16 DIAGNOSIS — T148XXA Other injury of unspecified body region, initial encounter: Secondary | ICD-10-CM | POA: Diagnosis not present

## 2023-10-16 DIAGNOSIS — I739 Peripheral vascular disease, unspecified: Secondary | ICD-10-CM | POA: Diagnosis not present

## 2023-10-16 DIAGNOSIS — D500 Iron deficiency anemia secondary to blood loss (chronic): Secondary | ICD-10-CM | POA: Diagnosis not present

## 2023-10-16 LAB — BASIC METABOLIC PANEL
Anion gap: 7 (ref 5–15)
BUN: 21 mg/dL (ref 8–23)
CO2: 25 mmol/L (ref 22–32)
Calcium: 9 mg/dL (ref 8.9–10.3)
Chloride: 104 mmol/L (ref 98–111)
Creatinine, Ser: 1.04 mg/dL (ref 0.61–1.24)
GFR, Estimated: >60 mL/min (ref >60)
Glucose, Bld: 103 mg/dL — ABNORMAL HIGH (ref 70–99)
Potassium: 4 mmol/L (ref 3.5–5.1)
Sodium: 136 mmol/L (ref 135–145)

## 2023-10-16 LAB — CBC
HCT: 27.4 % — ABNORMAL LOW (ref 39.0–52.0)
Hemoglobin: 8.8 g/dL — ABNORMAL LOW (ref 13.0–17.0)
MCH: 26.4 pg (ref 26.0–34.0)
MCHC: 32.1 g/dL (ref 30.0–36.0)
MCV: 82.3 fL (ref 80.0–100.0)
Platelets: 351 10*3/uL (ref 150–400)
RBC: 3.33 MIL/uL — ABNORMAL LOW (ref 4.22–5.81)
RDW: 18.9 % — ABNORMAL HIGH (ref 11.5–15.5)
WBC: 12.4 10*3/uL — ABNORMAL HIGH (ref 4.0–10.5)
nRBC: 0 % (ref 0.0–0.2)

## 2023-10-16 LAB — MAGNESIUM: Magnesium: 1.7 mg/dL (ref 1.7–2.4)

## 2023-10-16 LAB — PHOSPHORUS: Phosphorus: 3 mg/dL (ref 2.5–4.6)

## 2023-10-16 MED ORDER — CYANOCOBALAMIN 1000 MCG PO TABS: 1000.0000 ug | ORAL_TABLET | Freq: Every day | ORAL | 0 refills | Status: DC

## 2023-10-16 MED ORDER — PIPERACILLIN-TAZOBACTAM IV (FOR PTA / DISCHARGE USE ONLY): 3.3750 g | Freq: Four times a day (QID) | INTRAVENOUS | 0 refills | Status: AC

## 2023-10-16 MED ORDER — MAGNESIUM CHLORIDE 64 MG PO TABS: 64.0000 mg | ORAL_TABLET | Freq: Two times a day (BID) | ORAL | 0 refills | Status: AC

## 2023-10-16 MED ORDER — OXYCODONE HCL 10 MG PO TABS: 10.0000 mg | ORAL_TABLET | ORAL | 0 refills | Status: AC | PRN

## 2023-10-16 NOTE — Progress Notes (Signed)
 Date of Admission:  10/09/2023      ID: Chad [name] is a 63 y.o. 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 with a history of urothelial ca, Colon ca s/p hemicolectomy, HCV on epclusa, left BKA Underwent rt BKA for gangrene and wounds on 09/30/23 And on 3/14 presented to the ED with popping of staples at the medial end of the stump and foul smelling discharge Taken for surgery by vascular on 3/15 and underwent I/D Also on 3.16 he went back to the OR for furtther debridement   Superficial culture proteus, VR enterococcus fecalis and providencia Surgical cultures on 3/15 same Pt is currently on vanco/cefepime and flagyl Today he went back to OR and had wound vac application Subjective: No specific complaints  Medications:   sodium chloride   Intravenous Once   ascorbic acid  500 mg Oral BID   atorvastatin  10 mg Oral Daily   calcium-vitamin D  2 tablet Oral Daily   chlorhexidine  60 mL Topical Once   Chlorhexidine Gluconate Cloth  6 each Topical Daily   vitamin B-12  1,000 mcg Oral Daily   cyclobenzaprine  15 mg Oral QHS   escitalopram  5 mg Oral Daily   Ferrous Fumarate  106 mg of iron Oral Daily   gabapentin  300 mg Oral BID   heparin  5,000 Units Subcutaneous Q8H   mirtazapine  7.5 mg Oral QHS   multivitamin with minerals  1 tablet Oral Daily   nicotine  21 mg Transdermal Daily   polyvinyl alcohol  1 drop Both Eyes Daily   senna-docusate  1 tablet Oral Daily   sodium chloride flush  10-40 mL Intracatheter Q12H   Sofosbuvir-Velpatasvir  1 tablet Oral Daily   tamsulosin  0.4 mg Oral QPC  supper   Vitamin D (Ergocalciferol)  50,000 Units Oral Q7 days    Objective: Vital signs in last 24 hours: Patient Vitals for the past 24 hrs:  BP Temp Temp src Pulse Resp SpO2  10/16/23 1438 107/74 99.4 F (37.4 C) Oral (!) 101 18 98 %  10/16/23 0740 103/69 97.9 F (36.6 C) Oral -- 18 98 %  10/16/23 0341 107/74 98.7 F (37.1 C) Oral 75 18 96 %  10/15/23 1930 (!) 106/95 99.3 F (37.4 C) Oral 99 18 95 %     LDA port  PHYSICAL EXAM:  General: Alert, cooperative, no distress,  Lungs: Clear to auscultation bilaterally. No Wheezing or Rhonchi. No rales. Heart: Regular rate and rhythm, no murmur, rub or gallop. Abdomen: Soft, colostomy Extremities: rt BKA site has wound vac Picture reviewed     Left BKA stump superficial erosion  Skin: No rashes or lesions. Or bruising Lymph: Cervical, supraclavicular normal. Neurologic: Grossly non-focal  Lab Results    Latest Ref Rng & Units 10/16/2023    6:20 AM 10/15/2023    5:53 AM 10/14/2023    5:28 AM  CBC  WBC 4.0 - 10.5 K/uL 12.4  12.8  14.9   Hemoglobin 13.0 - 17.0 g/dL 8.8  8.5  8.4   Hematocrit 39.0 - 52.0 %  27.4  26.5  26.4   Platelets 150 - 400 K/uL 351  348  362        Latest Ref Rng & Units 10/16/2023    6:20 AM 10/15/2023    5:53 AM 10/14/2023    5:28 AM  CMP  Glucose 70 - 99 mg/dL 161  93  81   BUN 8 - 23 mg/dL 21  21  18    Creatinine 0.61 - 1.24 mg/dL 0.96  0.45  4.09   Sodium 135 - 145 mmol/L 136  134  131   Potassium 3.5 - 5.1 mmol/L 4.0  4.0  4.0   Chloride 98 - 111 mmol/L 104  108  103   CO2 22 - 32 mmol/L 25  23  21    Calcium 8.9 - 10.3 mg/dL 9.0  8.9  9.0       Microbiology: Rt BKA site culture- pseudomonas, proteus and efecalis    Assessment/Plan: Rt BKA stump infection s/p I/D x 3 hAs wound VAC Cultures so far Proteus, pseudomonas, vancomycin-resistant Enterococcus faecalis patient is currently on zosyn Will need IV zosyn for 4 weeks- OPAT note placed   PCN allergy documented but patient has  tolerated Zosyn.   So unlikely to be a PCN allergy.  Left BKA     Recent RT BKA on 09/30/23 for gangrene rt foot and multiple wounds   HCV on epclusa    Anemia Has port   History of colon CA status post hemicolectomy has colostomy   Discussed the management with the patient and hospitalist Will follow him as OP I will sign off

## 2023-10-16 NOTE — Plan of Care (Signed)

## 2023-10-16 NOTE — Discharge Summary (Signed)
 Physician Discharge Summary   Patient: Chad [name] MRN: [id number] DOB: 10-19-1960  Admit date:     10/09/2023  Discharge date: 10/16/23  Discharge Physician: Marrion Coy   PCP: Pcp, No   Recommendations at discharge:   Follow-up with PCP in 1 week. Follow-up with vascular surgery as scheduled. Check a CBC in 1 week.  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 *  Hospital Course: Chad [name] is a 63 y.o. male with medical history significant of hyperlipidemia, COPD, PVD, depression with anxiety, anemia, chronic pain syndrome, MSH2 Lynch syndrome, multiple cancer (prostate cancer, urethral cancer, colon cancer), s/p of bilateral BKA, who presents with wound infection of right leg stumps.  Debridement was performed by vascular surgery on 3/18.  Currently on Zosyn and vancomycin per ID.  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  Clinically not septic.   Consulted with Dr. [name] Cullens of [facility], who recommended IV antibiotics S/p irrigation and debridement done on 3/15 Seen by ID, final culture results still pending.  Currently covered with Zosyn and vancomycin. Culture growth for different species, including Pseudomonas, Enterococcus faecalis and Proteus  Had a wound VAC changed today, ID has finalized antibiotics with Zosyn.  Medically stable for transfer back to nursing home.  Tobacco abuse. Patient is not compliant  with cessation of tobacco products.  He was caught vaping in the room.   HLD (hyperlipidemia) -Lipitor   Chronic hepatitis C without hepatic coma (HCC): -Continue Epclusa   COPD (chronic obstructive pulmonary disease) (HCC): Stable -Bronchodilators    Acute blood loss anemia s/p debridement of right AKA stump Iron deficient anemia. B12 deficient anemia. Received 3 units of PRBC, hemoglobin has been stable now. B12 and iron supplement be continued. Check a B12 level in 3 months, if B12 level still low, may need injected B12.     Depression with anxiety -Continue home medications   # Hypophosphatemia, # Hypomagnesemia Hyponatremia. Metabolic acidosis. Condition improved, will also continue some oral magnesium.   Pressure ulcer POA Pressure Injury 10/13/23 Thigh Posterior;Right Stage 2 -  Partial thickness loss of dermis presenting as a shallow open injury with a red, pink wound bed without slough. (Active)  10/13/23 2247  Location: Thigh  Location Orientation: Posterior;Right  Staging: Stage 2 -  Partial thickness loss of dermis presenting as a shallow open injury with a red, pink wound bed without slough.  Wound Description (Comments):   Present on Admission:      Pressure Injury 10/13/23 Buttocks Mid Stage 2 -  Partial thickness loss of dermis presenting as a shallow open injury with a red, pink wound bed without slough. (Active)  10/13/23 2248  Location: Buttocks  Location Orientation: Mid  Staging: Stage 2 -  Partial thickness loss of dermis presenting as a shallow open injury with a red, pink wound bed without slough.  Wound Description (Comments):   Present on Admission:           Consultants: Neurosurgery,  ID. Procedures performed: Debridement. Disposition: Skilled nursing facility Diet recommendation:  Discharge Diet Orders (From admission, onward)     Start     Ordered   10/16/23 0000  Diet - low sodium heart healthy        10/16/23 1020            Cardiac diet DISCHARGE MEDICATION: Allergies as of 10/16/2023       Reactions   Penicillins Rash   Tolerated piperacillin/tazobactam in Aug 2024        Medication List     STOP taking these medications    ibuprofen 200 MG tablet Commonly known as: ADVIL       TAKE these medications    acetaminophen 325 MG tablet Commonly known as: TYLENOL Take 650 mg by mouth 3 (three) times daily.   ascorbic acid 500 MG tablet Commonly known as: VITAMIN C Take 1 tablet (500 mg total) by mouth 2 (two) times daily.   aspirin EC 81 MG tablet Take 1 tablet (81 mg total) by mouth daily.   atorvastatin 10 MG tablet Commonly known as: Lipitor Take 1 tablet (10 mg total) by mouth daily.   Calcium 600 +D High Potency 600-10 MG-MCG Tabs Generic drug: Calcium Carbonate-Vitamin D Take 2 tablets by mouth daily.   carboxymethylcellulose 1 % ophthalmic solution Place 1 drop into the left eye at bedtime.   clopidogrel 75 MG tablet Commonly known as: PLAVIX Take 75 mg by mouth daily.   cyanocobalamin 1000 MCG tablet Take 1 tablet (1,000 mcg total) by mouth daily. Start taking on: October 17, 2023   cyclobenzaprine 7.5 MG tablet Commonly known as: FEXMID Take 15 mg by mouth at bedtime.   escitalopram 5 MG tablet Commonly known as: LEXAPRO Take 5 mg by mouth daily.   Ferretts 325 (106 Fe) MG Tabs tablet Generic drug: ferrous fumarate Take 1 tablet by mouth daily.   gabapentin 300 MG capsule Commonly known as: NEURONTIN Take 300 mg by mouth 2 (two) times daily.   Magnesium Chloride 64 MG Tabs Take 64 mg by mouth 2 (two) times daily.   mirtazapine 7.5 MG tablet Commonly known as: REMERON Take 7.5 mg by mouth at bedtime.   multivitamin tablet Take 1 tablet by mouth daily.   Oxycodone HCl 10 MG Tabs Take 1 tablet (10 mg total) by mouth every 4 (four) hours as needed. What changed: how much to take   oxymetazoline 0.05 % nasal spray Commonly known as: AFRIN Place 4  sprays into both nostrils every 6 (six) hours as needed (nose bleeds).   piperacillin-tazobactam IVPB Commonly known as: ZOSYN Inject 3.375 g into the vein every 6 (six) hours for 22 days. Indication:  BKA stump infection Last Day of Therapy:  11/07/2023 Labs - Once weekly:  CBC/D, CMP, ESR and CRP Fax weekly lab results  promptly to [phone] Method of administration: IVPB Method of administration may be changed at the discretion of facility and its pharmacy Call [phone] with any questions or critical value   Pro-Stat Liqd Take 45 mLs by mouth daily.   senna-docusate 8.6-50 MG tablet Commonly known as: Senokot-S Take 1 tablet by mouth daily.   Sofosbuvir-Velpatasvir 400-100 MG Tabs Commonly known as: Epclusa Take 1 tablet by mouth daily.   Systane 0.4-0.3 % Soln Generic drug: Polyethyl Glycol-Propyl Glycol Place 1 drop into both eyes daily.   tamsulosin 0.4 MG Caps capsule Commonly known as: Flomax Take 1 capsule (0.4 mg total) by mouth daily after supper.  zinc oxide 20 % ointment Apply 1 Application topically in the morning and at bedtime. Apply to sacrum               Discharge Care Instructions  (From admission, onward)           Start     Ordered   10/16/23 0000  Discharge wound care:       Comments: Pressure Injury 10/13/23 Buttocks Mid Stage 2 -  Partial thickness loss of dermis presenting as a shallow open injury with a red, pink wound bed without slough.    Pressure Injury 10/13/23 Thigh Posterior;Right Stage 2 -  Partial thickness loss of dermis presenting as a shallow open injury with a red, pink wound bed without slough.   10/16/23 1020   10/16/23 0000  Change dressing on IV access line weekly and PRN  (Home infusion instructions - [facility] )        10/16/23 1042            Follow-up Information     Georgiana Spinner, NP Follow up in 3 week(s).   Specialty: Vascular Surgery Why: Will need wound vac change at this  visit. October 27, 2023 8:30am Contact information: [address] [place] [address] [phone]                Discharge Exam: There were no vitals filed for this visit. General exam: Appears calm and comfortable  Respiratory system: Clear to auscultation. Respiratory effort normal. Cardiovascular system: S1 & S2 heard, RRR. No JVD, murmurs, rubs, gallops or clicks.  Gastrointestinal system: Abdomen is nondistended, soft and nontender. No organomegaly or masses felt. Normal bowel sounds heard. Central nervous system: Alert and oriented. No focal neurological deficits. Extremities: Bilateral AKA. Skin: No rashes, lesions or ulcers Psychiatry: Judgement and insight appear normal. Mood & affect appropriate.    Condition at discharge: good  The results of significant diagnostics from this hospitalization (including imaging, microbiology, ancillary and laboratory) are listed below for reference.   Imaging Studies: MR FEMUR RIGHT W WO CONTRAST Result Date: 10/02/2023 CLINICAL DATA:  Soft tissue infection suspected. 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 EXAM: MRI OF THE RIGHT FEMUR WITHOUT AND WITH CONTRAST; MRI OF LOWER RIGHT EXTREMITY WITHOUT AND WITH CONTRAST TECHNIQUE: Multiplanar, multisequence MR imaging of the right thigh was performed both before and after administration of intravenous contrast. Multiplanar, multisequence MR imaging of the right calf was performed both before and after administration of intravenous contrast. CONTRAST:  7mL GADAVIST GADOBUTROL 1 MMOL/ML IV SOLN COMPARISON:  CT chest, abdomen, and pelvis 07/31/2023, PET-CT 08/30/2020 FINDINGS: RIGHT THIGH Bones/Joint/Cartilage Normal right femoral marrow signal.  The cortices are intact. Ligaments No ligament tear is seen. Muscles and Tendons There is mild edema within the posterior hamstring  musculature within the proximal and distal aspects of the right thigh. Soft tissues Mild edema within the junction of the posterior right buttock and proximal right thigh subcutaneous fat (axial image 20). Mild-to-moderate subcutaneous fat edema and swelling at the lateral greater than medial aspects of the right knee. -- RIGHT CALF Bones/Joint/Cartilage Postsurgical changes are seen of bony amputation to the level of the proximal right tibial diaphysis and fibular diaphysis. The distal tibia and fibular amputation margins appear sharp. No cortical erosion is seen. No abnormal marrow edema. Note is made the amputation surgery was only 2 days ago. Moderate degenerative changes of  the patellofemoral joint. No knee joint effusion. Ligaments The fibular collateral ligament and the medial collateral ligament appear intact. Muscles and Tendons There is edema within the proximal posterior calf musculature. Soft tissues There is metallic susceptibility artifact from surgical staples along the distal medial, distal, and distal lateral aspect of the right below-the-knee amputation stump. There is diffuse edema throughout the distal amputation stump soft tissues and musculature consistent with recent surgery 2 days ago. Only an 8 mm focus of fluid is seen within the deep aspect of the amputation site, just posterior to the distal remaining tibia (sagittal series 29 image 18, axial series 28, image 42, and coronal series 21, image 18). This may represent a small postoperative seroma. No definite rim enhancing walled-off abscess is seen. IMPRESSION: 1. Postsurgical changes of right below-the-knee amputation to the level of the proximal right tibial diaphysis and fibular diaphysis. No evidence of osteomyelitis. 2. Diffuse edema throughout the distal amputation stump soft tissues and musculature consistent with recent surgery 2 days ago. Only an 8 mm focus of fluid is seen within the deep aspect of the amputation site, just  posterior to the distal remaining tibia. This may represent a small postoperative seroma. No definite rim enhancing walled-off abscess is seen. 3. Mild edema within the posterior hamstring musculature within the proximal and distal aspects of the right thigh. This is nonspecific and may represent muscle strain versus myositis. 4. Mild-to-moderate subcutaneous fat edema and swelling at the lateral greater than medial aspects of the right knee. Electronically Signed   By: Neita Garnet M.D.   On: 10/02/2023 13:39   MR TIBIA FIBULA RIGHT W WO CONTRAST Result Date: 10/02/2023 CLINICAL DATA:  Soft tissue infection suspected. 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 EXAM: MRI OF THE RIGHT FEMUR WITHOUT AND WITH CONTRAST; MRI OF LOWER RIGHT EXTREMITY WITHOUT AND WITH CONTRAST TECHNIQUE: Multiplanar, multisequence MR imaging of the right thigh was performed both before and after administration of intravenous contrast. Multiplanar, multisequence MR imaging of the right calf was performed both before and after administration of intravenous contrast. CONTRAST:  7mL GADAVIST GADOBUTROL 1 MMOL/ML IV SOLN COMPARISON:  CT chest, abdomen, and pelvis 07/31/2023, PET-CT 08/30/2020 FINDINGS: RIGHT THIGH Bones/Joint/Cartilage Normal right femoral marrow signal.  The cortices are intact. Ligaments No ligament tear is seen. Muscles and Tendons There is mild edema within the posterior hamstring musculature within the proximal and distal aspects of the right thigh. Soft tissues Mild edema within the junction of the posterior right buttock and proximal right thigh subcutaneous fat (axial image 20). Mild-to-moderate subcutaneous fat edema and swelling at the lateral greater than medial aspects of the right knee. -- RIGHT CALF Bones/Joint/Cartilage Postsurgical changes are seen of bony amputation to the level of the proximal right  tibial diaphysis and fibular diaphysis. The distal tibia and fibular amputation margins appear sharp. No cortical erosion is seen. No abnormal marrow edema. Note is made the amputation surgery was only 2 days ago. Moderate degenerative changes of the patellofemoral joint. No knee joint effusion. Ligaments The fibular collateral ligament and the medial collateral ligament appear intact. Muscles and Tendons There is edema within the proximal posterior calf musculature. Soft tissues There is metallic susceptibility artifact from surgical staples along the distal medial, distal, and distal lateral aspect of the right below-the-knee amputation stump. There is diffuse edema throughout the distal amputation stump soft tissues and musculature consistent with recent surgery 2 days ago. Only  an 8 mm focus of fluid is seen within the deep aspect of the amputation site, just posterior to the distal remaining tibia (sagittal series 29 image 18, axial series 28, image 42, and coronal series 21, image 18). This may represent a small postoperative seroma. No definite rim enhancing walled-off abscess is seen. IMPRESSION: 1. Postsurgical changes of right below-the-knee amputation to the level of the proximal right tibial diaphysis and fibular diaphysis. No evidence of osteomyelitis. 2. Diffuse edema throughout the distal amputation stump soft tissues and musculature consistent with recent surgery 2 days ago. Only an 8 mm focus of fluid is seen within the deep aspect of the amputation site, just posterior to the distal remaining tibia. This may represent a small postoperative seroma. No definite rim enhancing walled-off abscess is seen. 3. Mild edema within the posterior hamstring musculature within the proximal and distal aspects of the right thigh. This is nonspecific and may represent muscle strain versus myositis. 4. Mild-to-moderate subcutaneous fat edema and swelling at the lateral greater than medial aspects of the right knee.  Electronically Signed   By: Neita Garnet M.D.   On: 10/02/2023 13:39   DG Chest Port 1 View Result Date: 10/01/2023 CLINICAL DATA:  Fever EXAM: PORTABLE CHEST 1 VIEW COMPARISON:  04/26/2018 FINDINGS: Pulmonary deflation is normal and symmetric. There is asymmetric reticulonodular pulmonary infiltrate within the lung bases bilaterally which may relate to atypical infection in the appropriate clinical setting. No pneumothorax or pleural effusion. Right internal jugular chest port tip seen at the superior cavoatrial junction. Cardiac size within normal limits. Pulmonary vascularity is normal. No acute bone abnormality. IMPRESSION: 1. Asymmetric reticulonodular pulmonary infiltrate within the lung bases bilaterally which may relate to atypical infection in the appropriate clinical setting. Electronically Signed   By: Helyn Numbers M.D.   On: 10/01/2023 20:33   [place] OR NERVE BLOCK-IMAGE ONLY [facility]) Result Date: 09/30/2023 There is no interpretation for this exam.  This order is for images obtained during a surgical procedure.  Please See "Surgeries" Tab for more information regarding the procedure.    Microbiology: Results for orders placed or performed during the hospital encounter of 10/09/23  Aerobic/Anaerobic Culture w Gram Stain (surgical/deep wound)     Status: None   Collection Time: 10/09/23  7:47 PM   Specimen: Leg  Result Value Ref Range Status   Specimen Description   Final    LEG Performed at [facility], [address] [place], [place] [address]    Special Requests   Final    NONE Performed at [facility], [address] [place], [place] [address]    Gram Stain   Final    FEW WBC PRESENT, PREDOMINANTLY PMN MODERATE GRAM NEGATIVE RODS FEW GRAM POSITIVE COCCI    Culture   Final    FEW PROTEUS MIRABILIS FEW PROVIDENCIA STUARTII FEW ENTEROCOCCUS FAECALIS VANCOMYCIN RESISTANT ENTEROCOCCUS ISOLATED NO ANAEROBES ISOLATED Performed at [facility],  [address], [place], [place] [address]    Report Status 10/14/2023 FINAL  Final   Organism ID, Bacteria PROTEUS MIRABILIS  Final   Organism ID, Bacteria ENTEROCOCCUS FAECALIS  Final   Organism ID, Bacteria PROVIDENCIA STUARTII  Final      Susceptibility   Enterococcus faecalis - MIC*    AMPICILLIN <=2 SENSITIVE Sensitive     VANCOMYCIN >=32 RESISTANT Resistant     GENTAMICIN SYNERGY RESISTANT Resistant     LINEZOLID 1 SENSITIVE Sensitive     * FEW ENTEROCOCCUS FAECALIS  Proteus mirabilis - MIC*    AMPICILLIN >=32 RESISTANT Resistant     CEFEPIME <=0.12 SENSITIVE Sensitive     CEFTAZIDIME <=1 SENSITIVE Sensitive     CEFTRIAXONE <=0.25 SENSITIVE Sensitive     CIPROFLOXACIN >=4 RESISTANT Resistant     GENTAMICIN <=1 SENSITIVE Sensitive     IMIPENEM 2 SENSITIVE Sensitive     TRIMETH/SULFA >=320 RESISTANT Resistant     AMPICILLIN/SULBACTAM 4 SENSITIVE Sensitive     PIP/TAZO <=4 SENSITIVE Sensitive ug/mL    * FEW PROTEUS MIRABILIS   Providencia stuartii - MIC*    AMPICILLIN >=32 RESISTANT Resistant     CEFEPIME <=0.12 SENSITIVE Sensitive     CEFTAZIDIME <=1 SENSITIVE Sensitive     CEFTRIAXONE <=0.25 SENSITIVE Sensitive     CIPROFLOXACIN >=4 RESISTANT Resistant     GENTAMICIN RESISTANT Resistant     IMIPENEM 2 SENSITIVE Sensitive     TRIMETH/SULFA 160 RESISTANT Resistant     AMPICILLIN/SULBACTAM >=32 RESISTANT Resistant     PIP/TAZO <=4 SENSITIVE Sensitive ug/mL    * FEW PROVIDENCIA STUARTII  Blood culture (routine x 2)     Status: None   Collection Time: 10/09/23  8:01 PM   Specimen: BLOOD  Result Value Ref Range Status   Specimen Description BLOOD BLOOD LEFT ARM  Final   Special Requests   Final    BOTTLES DRAWN AEROBIC AND ANAEROBIC Blood Culture results may not be optimal due to an inadequate volume of blood received in culture bottles   Culture   Final    NO GROWTH 5 DAYS Performed at [facility], [address] [place], [place] [address]    Report Status  10/14/2023 FINAL  Final  Blood culture (routine x 2)     Status: None   Collection Time: 10/09/23  8:08 PM   Specimen: BLOOD  Result Value Ref Range Status   Specimen Description BLOOD BLOOD RIGHT ARM  Final   Special Requests   Final    BOTTLES DRAWN AEROBIC AND ANAEROBIC Blood Culture results may not be optimal due to an inadequate volume of blood received in culture bottles   Culture   Final    NO GROWTH 5 DAYS Performed at [facility], [address] [place], [place] [address]    Report Status 10/14/2023 FINAL  Final  Aerobic/Anaerobic Culture w Gram Stain (surgical/deep wound)     Status: None   Collection Time: 10/10/23  8:41 AM   Specimen: Wound; Abscess  Result Value Ref Range Status   Specimen Description   Final    ABSCESS Performed at [facility], [address], [place], [place] [address]    Special Requests   Final    RIGHT BELOW THE KNEE AMPUTATION Performed at [facility], [address] [place], [place] [address]    Gram Stain   Final    RARE WBC PRESENT, PREDOMINANTLY PMN ABUNDANT GRAM NEGATIVE RODS ABUNDANT GRAM POSITIVE COCCI    Culture   Final    MODERATE PROTEUS MIRABILIS MODERATE ENTEROCOCCUS FAECALIS SUSCEPTIBILITIES PERFORMED ON PREVIOUS CULTURE WITHIN THE LAST 5 DAYS. FEW PSEUDOMONAS AERUGINOSA NO ANAEROBES ISOLATED Performed at [facility], [address], [place], [place] [address]    Report Status 10/16/2023 FINAL  Final   Organism ID, Bacteria PROTEUS MIRABILIS  Final   Organism ID, Bacteria PSEUDOMONAS AERUGINOSA  Final      Susceptibility   Pseudomonas aeruginosa - MIC*    CEFTAZIDIME 4 SENSITIVE Sensitive  CIPROFLOXACIN >=4 RESISTANT Resistant     GENTAMICIN 8 INTERMEDIATE Intermediate     IMIPENEM >=16 RESISTANT Resistant     PIP/TAZO 16 SENSITIVE Sensitive ug/mL    * FEW PSEUDOMONAS AERUGINOSA   Proteus mirabilis - MIC*    AMPICILLIN <=2 SENSITIVE Sensitive     CEFEPIME <=0.12 SENSITIVE  Sensitive     CEFTAZIDIME <=1 SENSITIVE Sensitive     CEFTRIAXONE <=0.25 SENSITIVE Sensitive     CIPROFLOXACIN >=4 RESISTANT Resistant     GENTAMICIN <=1 SENSITIVE Sensitive     IMIPENEM 2 SENSITIVE Sensitive     TRIMETH/SULFA <=20 SENSITIVE Sensitive     AMPICILLIN/SULBACTAM <=2 SENSITIVE Sensitive     PIP/TAZO <=4 SENSITIVE Sensitive ug/mL    * MODERATE PROTEUS MIRABILIS  MIC (1 Drug)-aerobic culture; 10/10/2023; Wound; pseudomonas; Avycaz     Status: None   Collection Time: 10/10/23  3:00 PM   Specimen: Wound  Result Value Ref Range Status   Min Inhibitory Conc (1 Drug) Preliminary report  Final    Comment: (NOTE) Performed At: [facility] [address] [place], [place] [id number] Jolene Schimke MD OZ:[id number]    Source ABSCESS  Final    Comment: Performed at [facility], [address], [place], [place] [address]  MIC Result     Status: None   Collection Time: 10/10/23  3:00 PM  Result Value Ref Range Status   Result 1 (MIC) Comment  Final    Comment: (NOTE) Pseudomonas aeruginosa Identification performed by account, not confirmed by this laboratory. Performed At: [facility] [address] [place], [place] [id number] Jolene Schimke MD UU:[id number]    *Note: Due to a large number of results and/or encounters for the requested time period, some results have not been displayed. A complete set of results can be found in Results Review.    Labs: CBC: Recent Labs  Lab 10/09/23 2001 10/10/23 0514 10/12/23 0512 10/13/23 0508 10/14/23 0528 10/15/23 0553 10/16/23 0620  WBC 8.3   < > 12.0* 12.1* 14.9* 12.8* 12.4*  NEUTROABS 5.6  --   --   --   --   --   --   HGB 8.7*   < > 8.4* 8.6* 8.4* 8.5* 8.8*  HCT 27.8*   < > 25.6* 26.3* 26.4* 26.5* 27.4*  MCV 82.0   < > 78.5* 79.9* 79.5* 81.5 82.3  PLT 484*   < > 383 368 362 348 351   < > = values in this interval not displayed.   Basic Metabolic Panel: Recent Labs  Lab 10/12/23 1152  10/13/23 0508 10/14/23 0528 10/15/23 0553 10/16/23 0620  NA 135 136 131* 134* 136  K 4.0 3.7 4.0 4.0 4.0  CL 109 107 103 108 104  CO2 20* 21* 21* 23 25  GLUCOSE 121* 97 81 93 103*  BUN 20 19 18 21 21   CREATININE 0.82 0.75 0.87 1.10 1.04  CALCIUM 8.7* 8.9 9.0 8.9 9.0  MG 1.7 1.3* 2.4 1.7 1.7  PHOS 2.3* 3.3 3.5 3.2 3.0   Liver Function Tests: Recent Labs  Lab 10/09/23 1945  AST 29  ALT 25  ALKPHOS 145*  BILITOT 0.6  PROT 7.4  ALBUMIN 2.5*   CBG: No results for input(s): "GLUCAP" in the last 168 hours.  Discharge time spent: greater than 30 minutes.  Signed: Marrion Coy, MD [facility] 10/16/2023

## 2023-10-16 NOTE — [place] FL2 (Signed)
 [place] MEDICAID FL2 LEVEL OF CARE FORM     IDENTIFICATION  Patient Name: Chad [name] Birthdate: 1961-07-01 Sex: male Admission Date (Current Location): 10/09/2023  [facility] and [place] Number:  Chiropodist and Address:         Provider Number: [id number]  Attending Physician Name and Address:  Marrion Coy, MD  Relative Name and Phone Number:       Current Level of Care: Hospital Recommended Level of Care: Skilled Nursing Facility Prior Approval Number:    Date Approved/Denied:   PASRR Number: [id number]  Discharge Plan: SNF    Current Diagnoses: Patient Active Problem List   Diagnosis Date Noted   Amputation stump infection (HCC) 10/14/2023   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 10/09/2023   HLD (hyperlipidemia) 10/09/2023   PVD (peripheral vascular disease) (HCC) 10/09/2023   COPD (chronic obstructive pulmonary disease) (HCC) 10/09/2023   Depression with anxiety 10/09/2023   Atherosclerosis of artery of extremity with ulceration (HCC) 09/30/2023   Need for hepatitis B screening test 07/01/2023   Screening for HIV (human immunodeficiency virus) 07/01/2023   Chronic hepatitis C without hepatic coma (HCC) 06/17/2023   Open wound 06/16/2023   Medication management 06/16/2023   Protein-calorie malnutrition (HCC) 06/16/2023   Gangrene of toe (HCC) 02/27/2023   Non-healing wound of left lower extremity 02/26/2023   Tobacco abuse 02/26/2023   Cancer of sigmoid colon (HCC) 11/17/2022   Abnormal CT scan 10/23/2020   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/15/2019   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   Genetic testing 06/15/2019   Swelling of limb 04/15/2019   History of colon cancer 03/24/2019   Family history of prostate cancer    Family history of colon cancer    Family history of leukemia    Family history of kidney cancer    Goals of care, counseling/discussion 12/25/2018   Chronic pain syndrome  11/18/2018   Peripheral vascular disease of lower extremity with ulceration (HCC) 10/04/2018   Neoplasm related pain 09/08/2018   Poor appetite 09/08/2018   Weakness generalized 07/02/2018   Anorexia 07/02/2018   Chronic pain due to neoplasm 07/02/2018   Iron deficiency anemia due to chronic blood loss 05/27/2018   Ureteral cancer, 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   Palliative care by specialist    Palliative care encounter    Hydronephrosis    Urothelial cancer (HCC)    Dysphagia    Protein-calorie malnutrition, severe 04/26/2018   Severe sepsis (HCC) 04/25/2018   Sepsis (HCC) 04/25/2018    Orientation RESPIRATION BLADDER Height & Weight     Self, Time, Situation, Place    Incontinent Weight:   Height:     BEHAVIORAL SYMPTOMS/MOOD NEUROLOGICAL BOWEL NUTRITION STATUS      Colostomy Diet (Heart Healthy)  AMBULATORY STATUS COMMUNICATION OF NEEDS Skin   Extensive Assist Verbally Surgical wounds, PU Stage and Appropriate Care, Bruising, Skin abrasions, Wound Vac                       Personal Care Assistance Level of Assistance              Functional Limitations Info             SPECIAL CARE FACTORS FREQUENCY                       Contractures Contractures Info: Not present    Additional Factors Info  Code Status, Allergies, Isolation  Precautions Code Status Info: DNR Allergies Info: Penicillin     Isolation Precautions Info: MRSA VRE     Current Medications (10/16/2023):  This is the current hospital active medication list Current Facility-Administered Medications  Medication Dose Route Frequency Provider Last Rate Last Admin   0.9 %  sodium chloride infusion (Manually program via Guardrails IV Fluids)   Intravenous Once Schnier, Latina Craver, MD   Stopped at 10/11/23 1527   acetaminophen (TYLENOL) tablet 650 mg  650 mg Oral Q6H PRN Schnier, Latina Craver, MD       albuterol (PROVENTIL) (2.5 MG/3ML) 0.083% nebulizer solution 2.5 mg  2.5 mg Inhalation Q4H PRN  Schnier, Latina Craver, MD       ascorbic acid (VITAMIN C) tablet 500 mg  500 mg Oral BID Schnier, Latina Craver, MD   500 mg at 10/16/23 6578   atorvastatin (LIPITOR) tablet 10 mg  10 mg Oral Daily Schnier, Latina Craver, MD   10 mg at 10/16/23 4696   calcium-vitamin D (OSCAL WITH D) 500-5 MG-MCG per tablet 2 tablet  2 tablet Oral Daily Schnier, Latina Craver, MD   2 tablet at 10/16/23 2952   chlorhexidine (HIBICLENS) 4 % liquid 4 Application  60 mL Topical Once Schnier, Latina Craver, MD       Chlorhexidine Gluconate Cloth 2 % PADS 6 each  6 each Topical Daily Schnier, Latina Craver, MD   6 each at 10/16/23 1000   cyanocobalamin (VITAMIN B12) tablet 1,000 mcg  1,000 mcg Oral Daily Schnier, Latina Craver, MD   1,000 mcg at 10/16/23 8413   cyclobenzaprine (FLEXERIL) tablet 15 mg  15 mg Oral QHS Schnier, Latina Craver, MD   15 mg at 10/15/23 2207   dextromethorphan-guaiFENesin (MUCINEX DM) 30-600 MG per 12 hr tablet 1 tablet  1 tablet Oral BID PRN Schnier, Latina Craver, MD       diphenhydrAMINE (BENADRYL) injection 50 mg  50 mg Intravenous Once PRN Rolla Plate R, NP       escitalopram (LEXAPRO) tablet 5 mg  5 mg Oral Daily Schnier, Latina Craver, MD   5 mg at 10/16/23 2440   famotidine (PEPCID) tablet 40 mg  40 mg Oral Once PRN Rolla Plate R, NP       Ferrous Fumarate (HEMOCYTE - 106 mg FE) tablet 106 mg of iron  106 mg of iron Oral Daily Schnier, Latina Craver, MD   106 mg of iron at 10/16/23 1004   gabapentin (NEURONTIN) capsule 300 mg  300 mg Oral BID Renford Dills, MD   300 mg at 10/16/23 1027   heparin injection 5,000 Units  5,000 Units Subcutaneous Q8H Schnier, Latina Craver, MD   5,000 Units at 10/16/23 2536   methylPREDNISolone sodium succinate (SOLU-MEDROL) 125 mg/2 mL injection 125 mg  125 mg Intravenous Once PRN Rolla Plate R, NP       mirtazapine (REMERON) tablet 7.5 mg  7.5 mg Oral QHS Schnier, Latina Craver, MD   7.5 mg at 10/15/23 2207   multivitamin with minerals tablet 1 tablet  1 tablet Oral Daily Schnier, Latina Craver, MD   1  tablet at 10/16/23 6440   nicotine (NICODERM CQ - dosed in mg/24 hours) patch 21 mg  21 mg Transdermal Daily Schnier, Latina Craver, MD       ondansetron [facility]) injection 4 mg  4 mg Intravenous Q8H PRN Schnier, Latina Craver, MD       oxyCODONE (Oxy IR/ROXICODONE) immediate release tablet 10-15 mg  10-15 mg Oral Q4H  PRN Schnier, Latina Craver, MD   10 mg at 10/16/23 0740   piperacillin-tazobactam (ZOSYN) IVPB 3.375 g  3.375 g Intravenous Q8H Ravishankar, Rhodia Albright, MD 12.5 mL/hr at 10/16/23 0620 3.375 g at 10/16/23 1610   polyvinyl alcohol (LIQUIFILM TEARS) 1.4 % ophthalmic solution 1 drop  1 drop Both Eyes Daily Schnier, Latina Craver, MD   1 drop at 10/16/23 9604   senna-docusate (Senokot-S) tablet 1 tablet  1 tablet Oral Daily Schnier, Latina Craver, MD   1 tablet at 10/16/23 5409   sodium chloride flush (NS) 0.9 % injection 10-40 mL  10-40 mL Intracatheter Q12H Schnier, Latina Craver, MD   10 mL at 10/16/23 1000   sodium chloride flush (NS) 0.9 % injection 10-40 mL  10-40 mL Intracatheter PRN Schnier, Latina Craver, MD       Sofosbuvir-Velpatasvir 400-100 MG TABS 1 tablet  1 tablet Oral Daily Schnier, Latina Craver, MD   1 tablet at 10/16/23 1052   tamsulosin (FLOMAX) capsule 0.4 mg  0.4 mg Oral QPC supper Renford Dills, MD   0.4 mg at 10/15/23 1748   Vitamin D (Ergocalciferol) (DRISDOL) 1.25 MG (50000 UNIT) capsule 50,000 Units  50,000 Units Oral Q7 days Schnier, Latina Craver, MD   50,000 Units at 10/11/23 1517   Facility-Administered Medications Ordered in Other Encounters  Medication Dose Route Frequency Provider Last Rate Last Admin   heparin lock flush 100 UNIT/ML injection              Discharge Medications: Please see discharge summary for a list of discharge medications.  Relevant Imaging Results:  Relevant Lab Results:   Additional Information ss [id number]  Chapman Fitch, RN

## 2023-10-16 NOTE — Progress Notes (Signed)
 Report given to Thoenicia at [facility] care; all questions answered.

## 2023-10-16 NOTE — Progress Notes (Addendum)
 PHARMACY CONSULT NOTE FOR:  OUTPATIENT  PARENTERAL ANTIBIOTIC THERAPY (OPAT)  Indication: BKA stump infection Regimen: piperacillin/tazobactam 3.375gm IV q6h End date: 11/07/2023  Infuse via implanted port Labs - Once weekly:  CBC/D, CMP, ESR and CRP Fax weekly lab results  promptly to [phone] Call [phone] with any questions or critical value  IV antibiotic discharge orders are pended. To discharging provider:  please sign these orders via discharge navigator,  Select New Orders & click on the button choice - Manage This Unsigned Work.     Thank you for allowing pharmacy to be a part of this patient's care.  Juliette Alcide, PharmD, BCPS, BCIDP Work Cell: [phone] 10/16/2023 10:36 AM

## 2023-10-16 NOTE — Progress Notes (Signed)
  Progress Note    10/16/2023 8:34 AM 3 Days Post-Op  Subjective:   Chad [name] is a 63 year old male who presents to [facility] with right BKA stump infection. He was taken to the operating room twice over the weekend by Dr. Chrisandra Carota for incisional drainage and washout and again on 10/13/23. Patient is recovering as expected. No complaints overnight and vitals all remain stable.    Vitals:   10/16/23 0341 10/16/23 0740  BP: 107/74 103/69  Pulse: 75   Resp: 18 18  Temp: 98.7 F (37.1 C) 97.9 F (36.6 C)  SpO2: 96% 98%   Physical Exam: Cardiac:  RRR, Normal S1, S2. No murmurs. Lungs:  Non labored breathing, Rhonchi on auscultation. No rales or wheezing.  Incisions:  Right lower extremity BKA now with wound vac. Working well.  Extremities:  Right lower extremity BKA. Dressing clean dry and intact. Wound vac working well.   Abdomen:  Positive bowel sounds throughout, Soft, non tender and non distended.  Neurologic: AAOX3. Refusing to communicate today. Flat affect.  CBC    Component Value Date/Time   WBC 12.4 (H) 10/16/2023 0620   RBC 3.33 (L) 10/16/2023 0620   HGB 8.8 (L) 10/16/2023 0620   HGB 7.1 (L) 08/14/2023 0924   HCT 27.4 (L) 10/16/2023 0620   PLT 351 10/16/2023 0620   PLT 204 08/14/2023 0924   MCV 82.3 10/16/2023 0620   MCH 26.4 10/16/2023 0620   MCHC 32.1 10/16/2023 0620   RDW 18.9 (H) 10/16/2023 0620   LYMPHSABS 1.1 10/09/2023 2001   MONOABS 0.6 10/09/2023 2001   EOSABS 0.7 (H) 10/09/2023 2001   BASOSABS 0.1 10/09/2023 2001    BMET    Component Value Date/Time   NA 136 10/16/2023 0620   K 4.0 10/16/2023 0620   CL 104 10/16/2023 0620   CO2 25 10/16/2023 0620   GLUCOSE 103 (H) 10/16/2023 0620   BUN 21 10/16/2023 0620   CREATININE 1.04 10/16/2023 0620   CREATININE 1.09 08/14/2023 0924   CREATININE 1.07 07/01/2023 1359   CALCIUM 9.0 10/16/2023 0620   GFRNONAA >60 10/16/2023 0620   GFRNONAA >60 08/14/2023 0924   GFRAA >60 04/19/2020 0858    INR     Component Value Date/Time   INR 1.1 10/10/2023 0514     Intake/Output Summary (Last 24 hours) at 10/16/2023 0834 Last data filed at 10/15/2023 2207 Gross per 24 hour  Intake 460 ml  Output 1325 ml  Net -865 ml     Assessment/Plan:  63 y.o. male is s/p  Right BKA I&D with Wound Vac Placement  3 Days Post-Op   PLAN Pain medication PRN OOB To Chair Wound vac changed this morning with Wound Vac Nurse. No complications or infection noted to the patients wound. Wound vac working well. Patient is cleared for discharge this morning. Patient will need wound vac change twice weekly. Recommend Monday and Thursday weekly. . Return to clinic 2 weeks post discharge for wound check.    DVT prophylaxis:  Heparin 5000 units subcu every 8 hours.    Marcie [facility] 10/16/2023 8:34 AM

## 2023-10-16 NOTE — Treatment Plan (Signed)
 Diagnosis: BKA stump infection Baseline Creatinine 1.04  Culture Result: proteus , enterococcus and pseudomonas  Allergies  Allergen Reactions   Penicillins Rash    Tolerated piperacillin/tazobactam in Aug 2024    OPAT Orders Discharge antibiotics: Zosyn 3.375 grams IV every 6 hrs Duration: 4 weeks  End Date: 11/07/23 ( may extend until 4/26 depending on healing)   PORT Care Per Protocol:  Labs weekly while on IV antibiotics: _X_ CBC with differential  _X_ CMP _X_ CRP _X_ ESR   Fax weekly lab results  promptly to [phone]  Clinic Follow Up Appt: video visit with [facility] 10/29/23   Call [phone] with any questions or critical value

## 2023-10-16 NOTE — Consult Note (Addendum)
 WOC Nurse wound Consult Note: Vascular PA at the bedside for dressing change and wound assessment.  Pt was medicated for pain prior to the procedure but it was still very painful and he was moaning.  Right BKA with full thickness post-op wound; 12X11X2cm, red and moist with exposed bone, mod amt pink drainage in the cannister.  Applied one piece black foam to cont suction with good seal.  WOC team will plan to change again on Mon if patient is still in the hospital at that time.   WOC Nurse Ostomy Consult Note: Pt is familiar to [name] team from a previous admission for colostomy surgery in April 2023.  He lives in a facility where he has total assistance with ostomy pouch application and emptying and does not assist.  Stoma type/location: Colostomy pouch is intact with good seal, mod amt brown stool.  Extra supplies left in the room for staff nurse's use. Use Supplies: barrier ring [place] # [phone], wafer [place] # [id number], pouch [place] # [id number] MSN, RN, [place], [place], [place] [phone]

## 2023-10-18 LAB — MINIMUM INHIBITORY CONC. (1 DRUG)

## 2023-10-18 LAB — MIC RESULT

## 2023-10-19 LAB — LAB REPORT - SCANNED: EGFR: 90

## 2023-10-20 LAB — LAB REPORT - SCANNED: EGFR: 66

## 2023-10-22 ENCOUNTER — Encounter: Payer: Self-pay | Admitting: Internal Medicine

## 2023-10-25 ENCOUNTER — Encounter: Payer: Self-pay | Admitting: Vascular Surgery

## 2023-10-26 ENCOUNTER — Encounter: Payer: Self-pay | Admitting: Internal Medicine

## 2023-10-27 ENCOUNTER — Ambulatory Visit (INDEPENDENT_AMBULATORY_CARE_PROVIDER_SITE_OTHER): Admitting: Nurse Practitioner

## 2023-10-27 ENCOUNTER — Encounter (INDEPENDENT_AMBULATORY_CARE_PROVIDER_SITE_OTHER): Payer: Self-pay | Admitting: Nurse Practitioner

## 2023-10-27 VITALS — BP 105/69 | HR 88 | Resp 16

## 2023-10-27 DIAGNOSIS — T8189XA Other complications of procedures, not elsewhere classified, initial encounter: Secondary | ICD-10-CM

## 2023-10-27 DIAGNOSIS — Z89512 Acquired absence of left leg below knee: Secondary | ICD-10-CM

## 2023-10-27 DIAGNOSIS — T8789 Other complications of amputation stump: Secondary | ICD-10-CM

## 2023-10-27 LAB — AEROBIC/ANAEROBIC CULTURE W GRAM STAIN (SURGICAL/DEEP WOUND)

## 2023-10-27 LAB — LAB REPORT - SCANNED: EGFR: 89

## 2023-10-27 NOTE — Progress Notes (Signed)
 Subjective:    Patient ID: Chad [name], male    DOB: February 08, 1961, 63 y.o.   MRN: [id number] Chief Complaint  Patient presents with   [facility] 3 week wound check    The patient presents today for evaluation of right below-knee amputation site.  He underwent amputation on 09/30/2023 due to a long and nonhealing wound.  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  This has been treated by the wound care at his facility and is improving.  However on his right he developed an abscess and this was debrided multiple times.  However despite that the wound still remains largely open and there is actually bone exposed.  We advocated for the patient to undergo an above-knee amputation revision but he has refused.  Since that time he has been treated with antibiotics and continues to be treated with antibiotics.  He also has a wound VAC in place which appears to be functioning well.  Unfortunately he did not bring supplies today so we were unable to remove the VAC to evaluate the wound site itself.    Review of Systems  Skin:  Positive for wound.  All other systems reviewed and are negative.      Objective:   Physical Exam Vitals reviewed.  HENT:     Head: Normocephalic.  Cardiovascular:     Rate and Rhythm: Normal rate.  Pulmonary:     Effort: Pulmonary effort is normal.  Musculoskeletal:     Right Lower Extremity: Right leg is amputated below knee.     Left Lower Extremity: Left leg is amputated below knee.  Skin:    General: Skin is warm and dry.  Neurological:     Mental Status: He is alert and oriented to person, place, and time. Mental status is at baseline.  Psychiatric:        Mood and Affect: Mood normal.        Behavior: Behavior normal.        Thought Content: Thought content normal.        Judgment: Judgment normal.     BP 105/69   Pulse 88   Resp 16   Past Medical History:   Diagnosis Date   Anemia    Anxiety    ARF (acute respiratory failure) (HCC)    Atherosclerosis of arteries of extremities (HCC)    Benign prostatic hyperplasia with urinary obstruction    Bladder cancer (HCC)    BPH with obstruction/lower urinary tract symptoms    Chronic viral hepatitis C (HCC)    Colon cancer (HCC)    COPD (chronic obstructive pulmonary disease) (HCC)    Depression    Dysphagia    Family history of colon cancer    Family history of kidney cancer    Family history of leukemia    Family history of prostate cancer    Generalized anxiety disorder    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    GERD (gastroesophageal reflux disease)    Hepatitis    chronic hep c   Hydronephrosis    Hydronephrosis with ureteral stricture    Hyperlipidemia    Insomnia, unspecified    Knee pain    Left   Major depressive disorder, recurrent, moderate (HCC)    Malignant neoplasm of colon (HCC)    Malnutrition (HCC)    Muscle weakness (generalized)  Nerve pain    Other abnormalities of gait and mobility    Other chronic pain    Peripheral vascular disease (HCC)    Personal history of transient cerebral ischemia    Pressure ulcer    Prostate cancer (HCC)    Stroke (HCC)    Tinea unguium    Tobacco user    Unspecified protein-calorie malnutrition (HCC)    Ureteral cancer, right (HCC)    Urinary frequency    Venous hypertension of both lower extremities    Xerosis cutis     Social History   Socioeconomic History   Marital status: Single    Spouse name: Not on file   Number of children: Not on file   Years of education: Not on file   Highest education level: Not on file  Occupational History   Not on file  Tobacco Use   Smoking status: Every Day    Current packs/day: 0.50    Types: Cigarettes    Passive exposure: Never   Smokeless tobacco: Never  Vaping Use   Vaping status: Never Used  Substance and Sexual Activity   Alcohol use: Not Currently    Drug use: Not Currently    Types: Marijuana, Methylphenidate    Comment: quit 1997-1998 ish   Sexual activity: Not Currently  Other Topics Concern   Not on file  Social History Narrative    used to live [place]; moved  To [place]- end of April 2019; in Nursing home; 1pp/day; quit alcohol. 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        Family- dad- prostate ca [at 78y]; brother- 69 died of prostate cancer; brother- 66- no cancers [[place]]; sonThayer [name] [];Chad [name]-32y prostate ca [facility] [place]]; daughter- 64 [[place]]; another daughter 41 [[place]/addict]. will refer genetics counseling. Given MSI- abnormal; highly suspicious of Lynch syndrome.  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   Social Drivers of Corporate investment banker Strain: Not on file  Food Insecurity: No Food Insecurity (10/13/2023)   Hunger Vital Sign    Worried About Running Out of Food in the Last Year: Never true    Ran Out of Food in the Last Year: Never true  Transportation Needs: No Transportation Needs (10/13/2023)   PRAPARE - Administrator, Civil Service (Medical): No    Lack of Transportation (Non-Medical): No  Physical Activity: Not on file  Stress: Not on file  Social Connections: Moderately Integrated (10/10/2023)   Social Connection and Isolation Panel [NHANES]    Frequency of Communication with Friends and Family: Twice a week    Frequency of Social Gatherings with Friends and Family: Twice a week    Attends Religious Services: 1 to 4 times per year    Active Member of Clubs or Organizations: No    Attends Banker Meetings: 1 to 4 times per year    Marital Status: Never married  Intimate Partner Violence: Not At Risk (10/13/2023)   Humiliation, Afraid, Rape, and Kick questionnaire    Fear of Current or Ex-Partner: No    Emotionally Abused: No    Physically Abused: No    Sexually Abused: No    Past Surgical History:  Procedure Laterality Date   AMPUTATION Left 03/03/2023    Procedure: AMPUTATION BELOW KNEE;  Surgeon: Renford Dills, MD;  Location: [facility];  Service: Vascular;  Laterality: Left;   AMPUTATION Right 09/30/2023   Procedure: AMPUTATION OF AMPUTATION, BELOW KNEE;  Surgeon: Gilda Crease,  Latina Craver, MD;  Location: [facility];  Service: Vascular;  Laterality: Right;   APPLICATION OF WOUND VAC Right 10/13/2023   Procedure: APPLICATION, WOUND VAC;  Surgeon: Renford Dills, MD;  Location: [facility];  Service: Vascular;  Laterality: Right;   COLON SURGERY     En bloc extended right hemicolectomy 07/2017   COLONOSCOPY WITH PROPOFOL N/A 11/06/2020   Procedure: COLONOSCOPY WITH PROPOFOL;  Surgeon: Wyline Mood, MD;  Location: [facility];  Service: Gastroenterology;  Laterality: N/A;   COLONOSCOPY WITH PROPOFOL N/A 07/31/2021   Procedure: COLONOSCOPY WITH PROPOFOL;  Surgeon: Earline Mayotte, MD;  Location: [facility];  Service: Endoscopy;  Laterality: N/A;   CYSTOSCOPY W/ RETROGRADES Right 08/30/2018   Procedure: CYSTOSCOPY WITH RETROGRADE PYELOGRAM;  Surgeon: Vanna Scotland, MD;  Location: [facility];  Service: Urology;  Laterality: Right;   CYSTOSCOPY WITH STENT PLACEMENT Right 04/25/2018   Procedure: CYSTOSCOPY WITH STENT PLACEMENT;  Surgeon: Vanna Scotland, MD;  Location: [facility];  Service: Urology;  Laterality: Right;   CYSTOSCOPY WITH STENT PLACEMENT Right 08/30/2018   Procedure: CYSTOSCOPY WITH STENT Exchange;  Surgeon: Vanna Scotland, MD;  Location: [facility];  Service: Urology;  Laterality: Right;   CYSTOSCOPY WITH STENT PLACEMENT Right 03/07/2019   Procedure: CYSTOSCOPY WITH STENT Exchange;  Surgeon: Vanna Scotland, MD;  Location: [facility];  Service: Urology;  Laterality: Right;   CYSTOSCOPY WITH STENT PLACEMENT Right 11/21/2019   Procedure: CYSTOSCOPY WITH STENT Exchange;  Surgeon: Vanna Scotland, MD;  Location: [facility];  Service: Urology;  Laterality: Right;   INCISION AND DRAINAGE OF DEEP ABSCESS, KNEE Right 10/11/2023   Procedure: INCISION  AND DRAINAGE OF DEEP ABSCESS, KNEE;  Surgeon: Iline Oven, MD;  Location: [facility];  Service: Vascular;  Laterality: Right;   INCISION AND DRAINAGE OF WOUND Right 10/10/2023   Procedure: IRRIGATION AND DEBRIDEMENT WOUND RIGHT BKA;  Surgeon: Iline Oven, MD;  Location: [facility];  Service: Vascular;  Laterality: Right;   INCISION AND DRAINAGE OF WOUND Right 10/13/2023   Procedure: IRRIGATION AND DEBRIDEMENT WOUND;  Surgeon: Renford Dills, MD;  Location: [facility];  Service: Vascular;  Laterality: Right;   LOWER EXTREMITY ANGIOGRAPHY Left 05/23/2019   Procedure: LOWER EXTREMITY ANGIOGRAPHY;  Surgeon: Annice Needy, MD;  Location: [facility];  Service: Cardiovascular;  Laterality: Left;   LOWER EXTREMITY ANGIOGRAPHY Right 05/30/2019   Procedure: LOWER EXTREMITY ANGIOGRAPHY;  Surgeon: Annice Needy, MD;  Location: [facility];  Service: Cardiovascular;  Laterality: Right;   LOWER EXTREMITY ANGIOGRAPHY Right 02/13/2020   Procedure: LOWER EXTREMITY ANGIOGRAPHY;  Surgeon: Annice Needy, MD;  Location: [facility];  Service: Cardiovascular;  Laterality: Right;   LOWER EXTREMITY ANGIOGRAPHY Left 02/20/2020   Procedure: LOWER EXTREMITY ANGIOGRAPHY;  Surgeon: Annice Needy, MD;  Location: [facility];  Service: Cardiovascular;  Laterality: Left;   LOWER EXTREMITY ANGIOGRAPHY Left 01/01/2023   Procedure: Lower Extremity Angiography;  Surgeon: Annice Needy, MD;  Location: [facility];  Service: Cardiovascular;  Laterality: Left;   PORTA CATH INSERTION N/A 02/28/2019   Procedure: PORTA CATH INSERTION;  Surgeon: Annice Needy, MD;  Location: [facility];  Service: Cardiovascular;  Laterality: N/A;   ROBOT ASSISTED LAPAROSCOPIC PARTIAL COLECTOMY  11/17/2022   tumor removed       Family History  Problem Relation Age of Onset   Prostate cancer Father 11   Cancer Brother 37       unsure type  Cancer Paternal Uncle        unsure type   Cancer Maternal  Grandmother        unsure type   Cancer Paternal Grandmother        unsure type   Kidney cancer Paternal Grandfather    Cancer Other        unsure types   Leukemia Son    Cancer Son        other cancers, possibly colon    Allergies  Allergen Reactions   Penicillins Rash    Tolerated piperacillin/tazobactam in Aug 2024       Latest Ref Rng & Units 10/16/2023    6:20 AM 10/15/2023    5:53 AM 10/14/2023    5:28 AM  CBC  WBC 4.0 - 10.5 K/uL 12.4  12.8  14.9   Hemoglobin 13.0 - 17.0 g/dL 8.8  8.5  8.4   Hematocrit 39.0 - 52.0 % 27.4  26.5  26.4   Platelets 150 - 400 K/uL 351  348  362       CMP     Component Value Date/Time   NA 136 10/16/2023 0620   K 4.0 10/16/2023 0620   CL 104 10/16/2023 0620   CO2 25 10/16/2023 0620   GLUCOSE 103 (H) 10/16/2023 0620   BUN 21 10/16/2023 0620   CREATININE 1.04 10/16/2023 0620   CREATININE 1.09 08/14/2023 0924   CREATININE 1.07 07/01/2023 1359   CALCIUM 9.0 10/16/2023 0620   PROT 7.4 10/09/2023 1945   ALBUMIN 2.5 (L) 10/09/2023 1945   AST 29 10/09/2023 1945   AST 18 08/14/2023 0924   ALT 25 10/09/2023 1945   ALT 11 08/14/2023 0924   ALT 10 07/01/2023 1359   ALKPHOS 145 (H) 10/09/2023 1945   BILITOT 0.6 10/09/2023 1945   BILITOT 0.2 08/14/2023 0924   GFRNONAA >60 10/16/2023 0620   GFRNONAA >60 08/14/2023 0924     VAS [place] ABI WITH/WO TBI Result Date: 09/11/2023 LOWER EXTREMITY ARTERIAL DUPLEX STUDY Patient Name:  AHLIJAH RAIA  Date of Exam:   09/09/2023 Medical Rec #: [id number]        Accession #:    [id number] Date of Birth: June 12, 1961         Patient Gender: M Patient Age:   57 years Exam Location:  [facility] Procedure:      VAS [place] LOWER EXTREMITY ARTERIAL DUPLEX Referring Phys: Sheppard Plumber --------------------------------------------------------------------------------  Indications: Peripheral artery disease.  Vascular Interventions: 05/23/2019 PTA left PTA.                         05/30/2019 PTA right distal PTA.                          02/13/2020 PTA of right PTA.                         02/20/2020 Catheter placement of SFA. Current ABI:            Not Obtained Comparison Study: 11/05/2022 Performing Technologist: Debbe Bales RVS  Examination Guidelines: A complete evaluation includes B-mode imaging, spectral Doppler, color Doppler, and power Doppler as needed of all accessible portions of each vessel. Bilateral testing is considered an integral part of a complete examination. Limited examinations for reoccurring indications may be performed as noted.  +----------+--------+-----+--------+---------+--------+ RIGHT     PSV cm/sRatioStenosisWaveform Comments +----------+--------+-----+--------+---------+--------+ CFA Distal67  triphasic         +----------+--------+-----+--------+---------+--------+ DFA       65                   triphasic         +----------+--------+-----+--------+---------+--------+ SFA Prox  96                   triphasic         +----------+--------+-----+--------+---------+--------+ SFA Mid   107                  biphasic          +----------+--------+-----+--------+---------+--------+ SFA Distal63                   biphasic          +----------+--------+-----+--------+---------+--------+ POP Distal65                   biphasic          +----------+--------+-----+--------+---------+--------+ ATA Prox  65                   biphasic          +----------+--------+-----+--------+---------+--------+ PTA Prox  111                  biphasic          +----------+--------+-----+--------+---------+--------+  Summary: Right: Imaging and Waveforms obtained but limited due to Bandaged lower Leg (Mid Calf to Foot). Biphasic Waveforms seen predominantly in the Right Lower Extremity.  See table(s) above for measurements and observations. Electronically signed by Festus Barren MD on 09/11/2023 at 9:49:43 AM.    Final        Assessment &  Plan:   1. Hx of left BKA (HCC) (Primary) Continue with dressing changes as done by wound care at his facility  2. Delayed surgical wound healing of below-the-knee amputation stump (HCC) I had a very long discussion with the patient regarding his right below-knee amputation.  Based on the images of the wound that I have seen prior to his leaving the hospital there is exposed bone and typically we are unable to heal over exposed bone.  In trying to attempt to heal his below-knee amputation it is likely that this will be prolonged which will increase the risk of possible infection reaching his bone causing osteomyelitis.  In the case of osteomyelitis of the exposed bone he would need to undergo an above-knee amputation to treat this.  In addition given the size of the wound it is unlikely that he would be able to fit into utilize a prosthetic successfully.  Despite this conversation the patient continues to wish to proceed with wound vacuum therapy for wound healing.  Will plan on having her return in 3 weeks to evaluate progress.   Current Outpatient Medications on File Prior to Visit  Medication Sig Dispense Refill   acetaminophen (TYLENOL) 325 MG tablet Take 650 mg by mouth 3 (three) times daily.      Amino Acids-Protein Hydrolys (PRO-STAT) LIQD Take 45 mLs by mouth daily.     ascorbic acid (VITAMIN C) 500 MG tablet Take 1 tablet (500 mg total) by mouth 2 (two) times daily.     aspirin EC 81 MG tablet Take 1 tablet (81 mg total) by mouth daily. 150 tablet 2   atorvastatin (LIPITOR) 10 MG tablet Take 1 tablet (10 mg total) by mouth daily. 30 tablet 11   Calcium Carbonate-Vitamin D (CALCIUM  600 +D HIGH POTENCY) 600-10 MG-MCG TABS Take 2 tablets by mouth daily.     carboxymethylcellulose 1 % ophthalmic solution Place 1 drop into the left eye at bedtime.     clopidogrel (PLAVIX) 75 MG tablet Take 75 mg by mouth daily.     cyanocobalamin 1000 MCG tablet Take 1 tablet (1,000 mcg total) by mouth daily.  30 tablet 0   cyclobenzaprine (FEXMID) 7.5 MG tablet Take 15 mg by mouth at bedtime.     escitalopram (LEXAPRO) 5 MG tablet Take 5 mg by mouth daily.     ferrous fumarate (FERRETTS) 325 (106 Fe) MG TABS tablet Take 1 tablet by mouth daily.     gabapentin (NEURONTIN) 300 MG capsule Take 300 mg by mouth 2 (two) times daily.     Magnesium Chloride 64 MG TABS Take 64 mg by mouth 2 (two) times daily. 60 tablet 0   mirtazapine (REMERON) 7.5 MG tablet Take 7.5 mg by mouth at bedtime.     Multiple Vitamin (MULTIVITAMIN) tablet Take 1 tablet by mouth daily.     Oxycodone HCl 10 MG TABS Take 1 tablet (10 mg total) by mouth every 4 (four) hours as needed. 12 tablet 0   oxymetazoline (AFRIN) 0.05 % nasal spray Place 4 sprays into both nostrils every 6 (six) hours as needed (nose bleeds).     piperacillin-tazobactam (ZOSYN) IVPB Inject 3.375 g into the vein every 6 (six) hours for 22 days. Indication:  BKA stump infection Last Day of Therapy:  11/07/2023 Labs - Once weekly:  CBC/D, CMP, ESR and CRP Fax weekly lab results  promptly to [phone] Method of administration: IVPB Method of administration may be changed at the discretion of facility and its pharmacy Call [phone] with any questions or critical value 88 Units 0   Polyethyl Glycol-Propyl Glycol (SYSTANE) 0.4-0.3 % SOLN Place 1 drop into both eyes daily.     senna-docusate (SENOKOT-S) 8.6-50 MG tablet Take 1 tablet by mouth daily.     Sofosbuvir-Velpatasvir (EPCLUSA) 400-100 MG TABS Take 1 tablet by mouth daily. 28 tablet 2   tamsulosin (FLOMAX) 0.4 MG CAPS capsule Take 1 capsule (0.4 mg total) by mouth daily after supper. 30 capsule 3   zinc oxide 20 % ointment Apply 1 Application topically in the morning and at bedtime. Apply to sacrum     Current Facility-Administered Medications on File Prior to Visit  Medication Dose Route Frequency Provider Last Rate Last Admin   heparin lock flush 100 UNIT/ML injection             There are no  Patient Instructions on file for this visit. No follow-ups on file.   Georgiana Spinner, NP

## 2023-10-29 ENCOUNTER — Ambulatory Visit: Admitting: Infectious Diseases

## 2023-10-29 ENCOUNTER — Telehealth: Payer: Self-pay

## 2023-10-29 ENCOUNTER — Encounter: Payer: Self-pay | Admitting: Internal Medicine

## 2023-10-29 NOTE — Telephone Encounter (Signed)
 Patient missed his video visit  appointment today.  Patient is a current resident at [facility].  I spoke to the nurse with the SNF Lorena who I have rescheduled the patient's appoinment with for 11/03/23. Per Era Bumpers they are unable to accommodate a video visit for the patient.  I have asked Lorena to make sure the patient has his wound vac off per Dr. Rivka Safer when he comes to his appointment, so she can evaluate the wound.  Karrine Kluttz Jonathon Resides, CMA

## 2023-11-02 LAB — LAB REPORT - SCANNED: EGFR: 90

## 2023-11-03 ENCOUNTER — Ambulatory Visit: Attending: Infectious Diseases | Admitting: Infectious Diseases

## 2023-11-03 ENCOUNTER — Encounter: Payer: Self-pay | Admitting: Infectious Diseases

## 2023-11-03 VITALS — BP 108/67 | HR 88 | Temp 98.4°F

## 2023-11-03 DIAGNOSIS — B1920 Unspecified viral hepatitis C without hepatic coma: Secondary | ICD-10-CM | POA: Diagnosis not present

## 2023-11-03 DIAGNOSIS — Z4781 Encounter for orthopedic aftercare following surgical amputation: Secondary | ICD-10-CM | POA: Diagnosis present

## 2023-11-03 DIAGNOSIS — Z79899 Other long term (current) drug therapy: Secondary | ICD-10-CM | POA: Diagnosis not present

## 2023-11-03 DIAGNOSIS — Z8551 Personal history of malignant neoplasm of bladder: Secondary | ICD-10-CM | POA: Insufficient documentation

## 2023-11-03 DIAGNOSIS — T8743 Infection of amputation stump, right lower extremity: Secondary | ICD-10-CM | POA: Insufficient documentation

## 2023-11-03 DIAGNOSIS — B9689 Other specified bacterial agents as the cause of diseases classified elsewhere: Secondary | ICD-10-CM | POA: Diagnosis not present

## 2023-11-03 DIAGNOSIS — B952 Enterococcus as the cause of diseases classified elsewhere: Secondary | ICD-10-CM | POA: Diagnosis not present

## 2023-11-03 DIAGNOSIS — F17210 Nicotine dependence, cigarettes, uncomplicated: Secondary | ICD-10-CM | POA: Insufficient documentation

## 2023-11-03 DIAGNOSIS — Z85038 Personal history of other malignant neoplasm of large intestine: Secondary | ICD-10-CM | POA: Insufficient documentation

## 2023-11-03 DIAGNOSIS — B964 Proteus (mirabilis) (morganii) as the cause of diseases classified elsewhere: Secondary | ICD-10-CM | POA: Insufficient documentation

## 2023-11-03 DIAGNOSIS — Z89511 Acquired absence of right leg below knee: Secondary | ICD-10-CM | POA: Diagnosis not present

## 2023-11-03 DIAGNOSIS — T8740 Infection of amputation stump, unspecified extremity: Secondary | ICD-10-CM

## 2023-11-03 DIAGNOSIS — Z89512 Acquired absence of left leg below knee: Secondary | ICD-10-CM | POA: Insufficient documentation

## 2023-11-03 NOTE — Telephone Encounter (Signed)
 Spoke with Unit Manager Jasmine at the SNF and verbal orders given for IV abx end date 11/10/23. Picc can be removed on 11/12/23 per Dr. Rivka Safer.  Orders also sent back with the patient.  Chad [name] Chad [name] Resides, CMA

## 2023-11-03 NOTE — Progress Notes (Signed)
 NAME: Chad [name]  DOB: Mar 27, 1961  MRN: [id number]  Date/Time: 11/03/2023    Subjective:  Follow up after recent hospitalization between 3/14-3/21 at [facility] ? Chad [name] is a 63 y.o. 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 Taken for surgery by vascular on 3/15 He refused AKA and underwent I/D .Also on 3.16 he went back to the OR - for further debridement culture proteus, VR enterococcus fecalis and providencia He was sent to SNF on IV zosyn 3.375gmsiv q6 for 4 weeks He saw vascular as OP on 4/1 and they again recommended AKA for wound healing but he refused  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    GERD (gastroesophageal reflux disease)    Hepatitis    chronic hep c   Hydronephrosis    Hydronephrosis with ureteral stricture    Hyperlipidemia    Insomnia, unspecified    Knee pain    Left   Major depressive disorder, recurrent, moderate (HCC)    Malignant neoplasm of colon (HCC)    Malnutrition (HCC)    Muscle weakness (generalized)    Nerve pain    Other abnormalities of gait and mobility    Other chronic pain    Peripheral vascular  disease (HCC)    Personal history of transient cerebral ischemia    Pressure ulcer    Prostate cancer (HCC)    Stroke (HCC)    Tinea unguium    Tobacco user    Unspecified protein-calorie malnutrition (HCC)    Ureteral cancer, right (HCC)    Urinary frequency    Venous hypertension of both lower extremities    Xerosis cutis     Past Surgical History:  Procedure Laterality Date   AMPUTATION Left 03/03/2023   Procedure: AMPUTATION BELOW KNEE;  Surgeon: Renford Dills, MD;  Location: [facility];  Service: Vascular;  Laterality: Left;   AMPUTATION Right 09/30/2023   Procedure: AMPUTATION OF AMPUTATION, BELOW KNEE;  Surgeon: Renford Dills, MD;  Location: [facility];  Service: Vascular;  Laterality: Right;   APPLICATION OF WOUND VAC Right 10/13/2023   Procedure: APPLICATION, WOUND VAC;  Surgeon: Renford Dills, MD;  Location: [facility];  Service: Vascular;  Laterality: Right;   COLON SURGERY     En bloc extended right hemicolectomy 07/2017   COLONOSCOPY WITH PROPOFOL N/A 11/06/2020   Procedure: COLONOSCOPY WITH PROPOFOL;  Surgeon: Wyline Mood, MD;  Location: [facility] ENDOSCOPY;  Service: Gastroenterology;  Laterality: N/A;   COLONOSCOPY WITH PROPOFOL N/A 07/31/2021   Procedure: COLONOSCOPY WITH PROPOFOL;  Surgeon: Earline Mayotte, MD;  Location: [facility] ENDOSCOPY;  Service: Endoscopy;  Laterality: N/A;   CYSTOSCOPY W/ RETROGRADES Right 08/30/2018   Procedure: CYSTOSCOPY WITH RETROGRADE PYELOGRAM;  Surgeon: Vanna Scotland, MD;  Location: [facility];  Service: Urology;  Laterality: Right;   CYSTOSCOPY WITH STENT PLACEMENT Right 04/25/2018   Procedure: CYSTOSCOPY WITH STENT PLACEMENT;  Surgeon: Vanna Scotland, MD;  Location: [facility];  Service: Urology;  Laterality: Right;   CYSTOSCOPY WITH STENT PLACEMENT Right 08/30/2018   Procedure: CYSTOSCOPY WITH STENT Exchange;  Surgeon: Vanna Scotland, MD;  Location: [facility];  Service: Urology;  Laterality: Right;   CYSTOSCOPY WITH STENT PLACEMENT  Right 03/07/2019   Procedure: CYSTOSCOPY WITH STENT Exchange;  Surgeon: Vanna Scotland, MD;  Location: [facility];  Service: Urology;  Laterality: Right;   CYSTOSCOPY WITH STENT PLACEMENT Right 11/21/2019   Procedure: CYSTOSCOPY WITH STENT Exchange;  Surgeon: Vanna Scotland, MD;  Location: [facility];  Service: Urology;  Laterality: Right;   INCISION AND DRAINAGE OF DEEP ABSCESS, KNEE Right 10/11/2023   Procedure: INCISION AND DRAINAGE OF DEEP ABSCESS, KNEE;  Surgeon: Iline Oven, MD;  Location: [facility];  Service: Vascular;  Laterality: Right;   INCISION AND DRAINAGE OF WOUND Right 10/10/2023   Procedure: IRRIGATION AND DEBRIDEMENT WOUND RIGHT BKA;  Surgeon: Iline Oven, MD;  Location: [facility];  Service: Vascular;  Laterality: Right;   INCISION AND DRAINAGE OF WOUND Right 10/13/2023   Procedure: IRRIGATION AND DEBRIDEMENT WOUND;  Surgeon: Renford Dills, MD;  Location: [facility];  Service: Vascular;  Laterality: Right;   LOWER EXTREMITY ANGIOGRAPHY Left 05/23/2019   Procedure: LOWER EXTREMITY ANGIOGRAPHY;  Surgeon: Annice Needy, MD;  Location: [facility];  Service: Cardiovascular;  Laterality: Left;   LOWER EXTREMITY ANGIOGRAPHY Right 05/30/2019   Procedure: LOWER EXTREMITY ANGIOGRAPHY;  Surgeon: Annice Needy, MD;  Location: [facility];  Service: Cardiovascular;  Laterality: Right;   LOWER EXTREMITY ANGIOGRAPHY Right 02/13/2020   Procedure: LOWER EXTREMITY ANGIOGRAPHY;  Surgeon: Annice Needy, MD;  Location: [facility];  Service: Cardiovascular;  Laterality: Right;   LOWER EXTREMITY ANGIOGRAPHY Left 02/20/2020   Procedure: LOWER EXTREMITY ANGIOGRAPHY;  Surgeon: Annice Needy, MD;  Location: [facility];  Service: Cardiovascular;  Laterality: Left;   LOWER EXTREMITY ANGIOGRAPHY Left 01/01/2023   Procedure: Lower Extremity Angiography;  Surgeon: Annice Needy, MD;  Location: [facility];  Service: Cardiovascular;  Laterality: Left;   PORTA CATH INSERTION  N/A 02/28/2019   Procedure: PORTA CATH INSERTION;  Surgeon: Annice Needy, MD;  Location: [facility];  Service: Cardiovascular;  Laterality: N/A;   ROBOT ASSISTED LAPAROSCOPIC PARTIAL COLECTOMY  11/17/2022   tumor removed       Social History   Socioeconomic History   Marital status: Single    Spouse name: Not on file   Number of children: Not on file   Years of education: Not on file   Highest education level: Not on file  Occupational History   Not on file  Tobacco Use   Smoking status: Every Day    Current packs/day: 0.50    Types: Cigarettes    Passive exposure: Never   Smokeless tobacco: Never  Vaping Use   Vaping status: Never Used  Substance and Sexual Activity   Alcohol use: Not Currently   Drug use: Not Currently    Types: Marijuana, Methylphenidate    Comment: quit 1997-1998 ish   Sexual activity: Not  Currently  Other Topics Concern   Not on file  Social History Narrative    used to live [place]; moved  To [place]- end of April 2019; in Nursing home; 1pp/day; quit alcohol. 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        Family- dad- prostate ca [at 78y]; brother- 77 died of prostate cancer; brother- 55- no cancers [[place]]; sonThayer Ohm [[place]];[name]-32y prostate ca [facility] [place]]; daughter- 33 [[place]]; another daughter 41 [[place]/addict]. will refer genetics counseling. Given MSI- abnormal; highly suspicious of Lynch syndrome.  Patient's son Cristal Deer aware of high possible lynch syndrome.   Social Drivers of Corporate investment banker Strain: Not on file  Food Insecurity: No Food Insecurity (10/13/2023)   Hunger Vital Sign    Worried About Running Out of Food in the Last Year: Never true    Ran Out of Food in the Last Year: Never true  Transportation Needs: No Transportation Needs (10/13/2023)   PRAPARE - Administrator, Civil Service (Medical): No    Lack of Transportation (Non-Medical): No  Physical Activity: Not on file  Stress: Not on file  Social  Connections: Moderately Integrated (10/10/2023)   Social Connection and Isolation Panel [NHANES]    Frequency of Communication with Friends and Family: Twice a week    Frequency of Social Gatherings with Friends and Family: Twice a week    Attends Religious Services: 1 to 4 times per year    Active Member of [employer] or Organizations: No    Attends Engineer, structural: 1 to 4 times per year    Marital Status: Never married  Intimate Partner Violence: Not At Risk (10/13/2023)   Humiliation, Afraid, Rape, and Kick questionnaire    Fear of Current or Ex-Partner: No    Emotionally Abused: No    Physically Abused: No    Sexually Abused: No    Family History  Problem Relation Age of Onset   Prostate cancer Father 8   Cancer Brother 88       unsure type   Cancer Paternal Uncle        unsure type   Cancer Maternal Grandmother        unsure type   Cancer Paternal Grandmother        unsure type   Kidney cancer Paternal Grandfather    Cancer Other        unsure types   Leukemia Son    Cancer Son        other cancers, possibly colon   Allergies  Allergen Reactions   Penicillins Rash    Tolerated piperacillin/tazobactam in Aug 2024   I? Current Outpatient Medications  Medication Sig Dispense Refill   acetaminophen (TYLENOL) 325 MG tablet Take 650 mg by mouth 3 (three) times daily.      Amino Acids-Protein Hydrolys (PRO-STAT) LIQD Take 45 mLs by mouth daily.     ascorbic acid (VITAMIN C) 500 MG tablet Take 1 tablet (500 mg total) by mouth 2 (two) times daily.     aspirin EC 81 MG tablet Take 1 tablet (81 mg total) by mouth daily. 150 tablet 2   atorvastatin (LIPITOR) 10 MG tablet Take 1 tablet (10 mg total) by mouth daily. 30 tablet 11   Calcium Carbonate-Vitamin D (CALCIUM 600 +D HIGH POTENCY) 600-10 MG-MCG TABS Take 2 tablets by mouth daily.     carboxymethylcellulose 1 % ophthalmic solution Place 1 drop into the left eye at bedtime.  clopidogrel (PLAVIX) 75 MG tablet  Take 75 mg by mouth daily.     cyanocobalamin 1000 MCG tablet Take 1 tablet (1,000 mcg total) by mouth daily. 30 tablet 0   cyclobenzaprine (FEXMID) 7.5 MG tablet Take 15 mg by mouth at bedtime.     escitalopram (LEXAPRO) 5 MG tablet Take 5 mg by mouth daily.     ferrous fumarate (FERRETTS) 325 (106 Fe) MG TABS tablet Take 1 tablet by mouth daily.     gabapentin (NEURONTIN) 300 MG capsule Take 300 mg by mouth 2 (two) times daily.     Magnesium Chloride 64 MG TABS Take 64 mg by mouth 2 (two) times daily. 60 tablet 0   mirtazapine (REMERON) 7.5 MG tablet Take 7.5 mg by mouth at bedtime.     Multiple Vitamin (MULTIVITAMIN) tablet Take 1 tablet by mouth daily.     Oxycodone HCl 10 MG TABS Take 1 tablet (10 mg total) by mouth every 4 (four) hours as needed. 12 tablet 0   oxymetazoline (AFRIN) 0.05 % nasal spray Place 4 sprays into both nostrils every 6 (six) hours as needed (nose bleeds).     piperacillin-tazobactam (ZOSYN) IVPB Inject 3.375 g into the vein every 6 (six) hours for 22 days. Indication:  BKA stump infection Last Day of Therapy:  11/07/2023 Labs - Once weekly:  CBC/D, CMP, ESR and CRP Fax weekly lab results  promptly to [phone] Method of administration: IVPB Method of administration may be changed at the discretion of facility and its pharmacy Call [phone] with any questions or critical value 88 Units 0   Polyethyl Glycol-Propyl Glycol (SYSTANE) 0.4-0.3 % SOLN Place 1 drop into both eyes daily.     senna-docusate (SENOKOT-S) 8.6-50 MG tablet Take 1 tablet by mouth daily.     Sofosbuvir-Velpatasvir (EPCLUSA) 400-100 MG TABS Take 1 tablet by mouth daily. 28 tablet 2   tamsulosin (FLOMAX) 0.4 MG CAPS capsule Take 1 capsule (0.4 mg total) by mouth daily after supper. 30 capsule 3   zinc oxide 20 % ointment Apply 1 Application topically in the morning and at bedtime. Apply to sacrum     No current facility-administered medications for this visit.   Facility-Administered  Medications Ordered in Other Visits  Medication Dose Route Frequency Provider Last Rate Last Admin   heparin lock flush 100 UNIT/ML injection              Abtx:  Anti-infectives (From admission, onward)    None       REVIEW OF SYSTEMS:  Const: negative fever, negative chills, negative weight loss Eyes: negative diplopia or visual changes, negative eye pain ENT: negative coryza, negative sore throat Resp: negative cough, hemoptysis, dyspnea Cards: negative for chest pain, palpitations, lower extremity edema GU: negative for frequency, dysuria and hematuria GI: Negative for abdominal pain, diarrhea, bleeding, constipation Skin: negative for rash and pruritus Heme: negative for easy bruising and gum/nose bleeding MS: in wheel chair Pain rt stump Neurolo:negative for headaches, dizziness, vertigo, memory problems  Psych: negative for feelings of anxiety, depression  Endocrine: negative for thyroid, diabetes Allergy/Immunology- negative for any medication or food allergies ? Pertinent Positives include : Objective:  VITALS:  BP 108/67   Pulse 88   Temp 98.4 F (36.9 C) (Temporal)   SpO2 93%  Rt PICC PHYSICAL EXAM:  General: Alert,not  cooperative,  Lungs: Clear to auscultation bilaterally. No Wheezing or Rhonchi. No rales. Heart: Regular rate and rhythm, no murmur, rub or gallop. Abdomen:did not examine  Extremities: rt bka site wound vac removed Fleshy wound- not infected  Skin: No rashes or lesions. Or bruising Lymph: Cervical, supraclavicular normal. Neurologic: Grossly non-focal Pertinent Labs none  ? Impression/Recommendation Rt BKA stump infection On IV zosyn- week 3  showing improvement since the last evaluation.  The wound is too large to close with vacuum He refused AKA  and the wound's uneven cut may impede healing. It is managed with a wound vacuum and dressing changes. A revision by vascular surgery may be necessary to ensure proper healing. Continue  intravenous antibiotics until April 16.  Coordinate with the nursing home for wound care management. Follow up with vascular surgery for potential revision of the stump. Remove the PICC line after completing antibiotics on April 16. ? ? ________________________________________________ Discussed with patient in detail Informed SNF Note:  This document was prepared using Dragon voice recognition software and may include unintentional dictation errors.

## 2023-11-09 LAB — LAB REPORT - SCANNED: EGFR: 275.3

## 2023-11-12 ENCOUNTER — Inpatient Hospital Stay: Payer: Medicaid Other | Admitting: Internal Medicine

## 2023-11-12 ENCOUNTER — Inpatient Hospital Stay: Payer: Medicaid Other

## 2023-11-17 ENCOUNTER — Ambulatory Visit (INDEPENDENT_AMBULATORY_CARE_PROVIDER_SITE_OTHER): Admitting: Nurse Practitioner

## 2023-11-17 ENCOUNTER — Encounter (INDEPENDENT_AMBULATORY_CARE_PROVIDER_SITE_OTHER): Payer: Self-pay | Admitting: Nurse Practitioner

## 2023-11-17 VITALS — BP 93/63 | HR 98 | Resp 16

## 2023-11-17 DIAGNOSIS — T8189XA Other complications of procedures, not elsewhere classified, initial encounter: Secondary | ICD-10-CM

## 2023-11-17 DIAGNOSIS — T8789 Other complications of amputation stump: Secondary | ICD-10-CM

## 2023-11-17 DIAGNOSIS — Z89512 Acquired absence of left leg below knee: Secondary | ICD-10-CM

## 2023-11-17 NOTE — Progress Notes (Signed)
 Subjective:    Patient ID: Chad [name], male    DOB: November 25, 1960, 63 y.o.   MRN: [id number] Chief Complaint  Patient presents with   Routine Post Op    2-3 week wound check    The patient presents today for evaluation of right below-knee amputation site.  He underwent amputation on 09/30/2023 due to a long and nonhealing wound.  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  This has been treated by the wound care at his facility and is improving.  However on his right he developed an abscess and this was debrided multiple times.  However despite that the wound still remains largely open and there is actually bone exposed.  We advocated for the patient to undergo an above-knee amputation revision but he has refused.  Since that time he has been treated with antibiotics and continues to be treated with antibiotics.  The patient previously had a wound VAC in place but he had been removing his at his facility.  He notes that he did not want any more and refused to continue with issues.  Today the wound bed is red and beefy but the wound is still fairly wide open without any significant improvement.    Review of Systems  Skin:  Positive for wound.  All other systems reviewed and are negative.      Objective:   Physical Exam Vitals reviewed.  HENT:     Head: Normocephalic.  Cardiovascular:     Rate and Rhythm: Normal rate.  Pulmonary:     Effort: Pulmonary effort is normal.  Musculoskeletal:     Right Lower Extremity: Right leg is amputated below knee.     Left Lower Extremity: Left leg is amputated below knee.  Skin:    General: Skin is warm and dry.  Neurological:     Mental Status: He is alert and oriented to person, place, and time. Mental status is at baseline.  Psychiatric:        Mood and Affect: Mood normal.        Behavior: Behavior normal.        Thought Content: Thought content normal.         Judgment: Judgment normal.     BP 93/63   Pulse 98   Resp 16   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    GERD (gastroesophageal reflux disease)    Hepatitis    chronic hep c   Hydronephrosis    Hydronephrosis with ureteral stricture    Hyperlipidemia    Insomnia, unspecified    Knee pain    Left   Major depressive disorder, recurrent, moderate (HCC)    Malignant  neoplasm of colon (HCC)    Malnutrition (HCC)    Muscle weakness (generalized)    Nerve pain    Other abnormalities of gait and mobility    Other chronic pain    Peripheral vascular disease (HCC)    Personal history of transient cerebral ischemia    Pressure ulcer    Prostate cancer (HCC)    Stroke (HCC)    Tinea unguium    Tobacco user    Unspecified protein-calorie malnutrition (HCC)    Ureteral cancer, right (HCC)    Urinary frequency    Venous hypertension of both lower extremities    Xerosis cutis     Social History   Socioeconomic History   Marital status: Single    Spouse name: Not on file   Number of children: Not on file   Years of education: Not on file   Highest education level: Not on file  Occupational History   Not on file  Tobacco Use   Smoking status: Every Day    Current packs/day: 0.50    Types: Cigarettes    Passive exposure: Never   Smokeless tobacco: Never  Vaping Use    Vaping status: Never Used  Substance and Sexual Activity   Alcohol  use: Not Currently   Drug use: Not Currently    Types: Marijuana, Methylphenidate    Comment: quit 1997-1998 ish   Sexual activity: Not Currently  Other Topics Concern   Not on file  Social History Narrative    used to live [place]; moved  To [place]- end of April 2019; in Nursing home; 1pp/day; quit alcohol . 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        Family- dad- prostate ca [at 78y]; brother- 75 died of prostate cancer; brother- 82- no cancers [[place]]; sonLarinda Plover [[place]];[id number] prostate ca [[place] ]; daughter- 60 [[place]]; another daughter 60 [[place]/addict]. will refer genetics counseling. Given MSI- abnormal; highly suspicious of Lynch syndrome.  Patient's son [name] aware of high possible lynch syndrome.   Social Drivers of Corporate investment banker Strain: Not on file  Food Insecurity: No Food Insecurity (10/13/2023)   Hunger Vital Sign    Worried About Running Out of Food in the Last Year: Never true    Ran Out of Food in the Last Year: Never true  Transportation Needs: No Transportation Needs (10/13/2023)   PRAPARE - Administrator, Civil Service (Medical): No    Lack of Transportation (Non-Medical): No  Physical Activity: Not on file  Stress: Not on file  Social Connections: Moderately Integrated (10/10/2023)   Social Connection and Isolation Panel [NHANES]    Frequency of Communication with Friends and Family: Twice a week    Frequency of Social Gatherings with Friends and Family: Twice a week    Attends Religious Services: 1 to 4 times per year    Active Member of Clubs or Organizations: No    Attends Banker Meetings: 1 to 4 times per year    Marital Status: Never married  Intimate Partner Violence: Not At Risk (10/13/2023)   Humiliation, Afraid, Rape, and Kick questionnaire    Fear of Current or Ex-Partner: No    Emotionally Abused: No    Physically Abused: No    Sexually Abused: No     Past Surgical History:  Procedure Laterality Date   AMPUTATION Left 03/03/2023   Procedure: AMPUTATION BELOW KNEE;  Surgeon: Jackquelyn Mass, MD;  Location: [facility];  Service: Vascular;  Laterality: Left;   AMPUTATION Right 09/30/2023   Procedure: AMPUTATION OF AMPUTATION, BELOW KNEE;  Surgeon: Jackquelyn Mass, MD;  Location: [facility];  Service: Vascular;  Laterality: Right;   APPLICATION OF WOUND VAC Right 10/13/2023   Procedure: APPLICATION, WOUND VAC;  Surgeon: Jackquelyn Mass, MD;  Location: [facility];  Service: Vascular;  Laterality: Right;   COLON SURGERY     En bloc extended right hemicolectomy 07/2017   COLONOSCOPY WITH PROPOFOL  N/A 11/06/2020   Procedure: COLONOSCOPY WITH PROPOFOL ;  Surgeon: Luke Salaam, MD;  Location: [facility];  Service: Gastroenterology;  Laterality: N/A;   COLONOSCOPY WITH PROPOFOL  N/A 07/31/2021   Procedure: COLONOSCOPY WITH PROPOFOL ;  Surgeon: Marshall Skeeter, MD;  Location: [facility];  Service: Endoscopy;  Laterality: N/A;   CYSTOSCOPY W/ RETROGRADES Right 08/30/2018   Procedure: CYSTOSCOPY WITH RETROGRADE PYELOGRAM;  Surgeon: Dustin Gimenez, MD;  Location: [facility];  Service: Urology;  Laterality: Right;   CYSTOSCOPY WITH STENT PLACEMENT Right 04/25/2018   Procedure: CYSTOSCOPY WITH STENT PLACEMENT;  Surgeon: Dustin Gimenez, MD;  Location: [facility];  Service: Urology;  Laterality: Right;   CYSTOSCOPY WITH STENT PLACEMENT Right 08/30/2018   Procedure: CYSTOSCOPY WITH STENT Exchange;  Surgeon: Dustin Gimenez, MD;  Location: [facility];  Service: Urology;  Laterality: Right;   CYSTOSCOPY WITH STENT PLACEMENT Right 03/07/2019   Procedure: CYSTOSCOPY WITH STENT Exchange;  Surgeon: Dustin Gimenez, MD;  Location: [facility];  Service: Urology;  Laterality: Right;   CYSTOSCOPY WITH STENT PLACEMENT Right 11/21/2019   Procedure: CYSTOSCOPY WITH STENT Exchange;  Surgeon: Dustin Gimenez, MD;  Location: [facility];  Service: Urology;  Laterality:  Right;   INCISION AND DRAINAGE OF DEEP ABSCESS, KNEE Right 10/11/2023   Procedure: INCISION AND DRAINAGE OF DEEP ABSCESS, KNEE;  Surgeon: Jerri Morale, MD;  Location: [facility];  Service: Vascular;  Laterality: Right;   INCISION AND DRAINAGE OF WOUND Right 10/10/2023   Procedure: IRRIGATION AND DEBRIDEMENT WOUND RIGHT BKA;  Surgeon: Jerri Morale, MD;  Location: [facility];  Service: Vascular;  Laterality: Right;   INCISION AND DRAINAGE OF WOUND Right 10/13/2023   Procedure: IRRIGATION AND DEBRIDEMENT WOUND;  Surgeon: Jackquelyn Mass, MD;  Location: [facility];  Service: Vascular;  Laterality: Right;   LOWER EXTREMITY ANGIOGRAPHY Left 05/23/2019   Procedure: LOWER EXTREMITY ANGIOGRAPHY;  Surgeon: Celso College, MD;  Location: [facility];  Service: Cardiovascular;  Laterality: Left;   LOWER EXTREMITY ANGIOGRAPHY Right 05/30/2019   Procedure: LOWER EXTREMITY ANGIOGRAPHY;  Surgeon: Celso College, MD;  Location: [facility];  Service: Cardiovascular;  Laterality: Right;   LOWER EXTREMITY ANGIOGRAPHY Right 02/13/2020   Procedure: LOWER EXTREMITY ANGIOGRAPHY;  Surgeon: Celso College, MD;  Location: [facility];  Service: Cardiovascular;  Laterality: Right;   LOWER EXTREMITY ANGIOGRAPHY Left 02/20/2020   Procedure: LOWER EXTREMITY ANGIOGRAPHY;  Surgeon: Celso College, MD;  Location: [facility];  Service: Cardiovascular;  Laterality: Left;   LOWER EXTREMITY ANGIOGRAPHY Left 01/01/2023   Procedure: Lower Extremity Angiography;  Surgeon: Celso College, MD;  Location: [facility];  Service: Cardiovascular;  Laterality: Left;   PORTA CATH INSERTION N/A 02/28/2019   Procedure: PORTA CATH INSERTION;  Surgeon: Celso College, MD;  Location: [facility];  Service: Cardiovascular;  Laterality: N/A;   ROBOT ASSISTED LAPAROSCOPIC PARTIAL COLECTOMY  11/17/2022   tumor removed       Family History  Problem Relation Age of Onset  Prostate cancer Father 63   Cancer  Brother 24       unsure type   Cancer Paternal Uncle        unsure type   Cancer Maternal Grandmother        unsure type   Cancer Paternal Grandmother        unsure type   Kidney cancer Paternal Grandfather    Cancer Other        unsure types   Leukemia Son    Cancer Son        other cancers, possibly colon    Allergies  Allergen Reactions   Penicillins Rash    Tolerated piperacillin /tazobactam in Aug 2024       Latest Ref Rng & Units 10/16/2023    6:20 AM 10/15/2023    5:53 AM 10/14/2023    5:28 AM  CBC  WBC 4.0 - 10.5 K/uL 12.4  12.8  14.9   Hemoglobin 13.0 - 17.0 g/dL 8.8  8.5  8.4   Hematocrit 39.0 - 52.0 % 27.4  26.5  26.4   Platelets 150 - 400 K/uL 351  348  362       CMP     Component Value Date/Time   NA 136 10/16/2023 0620   K 4.0 10/16/2023 0620   CL 104 10/16/2023 0620   CO2 25 10/16/2023 0620   GLUCOSE 103 (H) 10/16/2023 0620   BUN 21 10/16/2023 0620   CREATININE 1.04 10/16/2023 0620   CREATININE 1.09 08/14/2023 0924   CREATININE 1.07 07/01/2023 1359   CALCIUM  9.0 10/16/2023 0620   PROT 7.4 10/09/2023 1945   ALBUMIN 2.5 (L) 10/09/2023 1945   AST 29 10/09/2023 1945   AST 18 08/14/2023 0924   ALT 25 10/09/2023 1945   ALT 11 08/14/2023 0924   ALT 10 07/01/2023 1359   ALKPHOS 145 (H) 10/09/2023 1945   BILITOT 0.6 10/09/2023 1945   BILITOT 0.2 08/14/2023 0924   GFRNONAA >60 10/16/2023 0620   GFRNONAA >60 08/14/2023 0924     VAS US  ABI WITH/WO TBI Result Date: 09/11/2023 LOWER EXTREMITY ARTERIAL DUPLEX STUDY Patient Name:  JAKOLBY SEDIVY  Date of Exam:   09/09/2023 Medical Rec #: [id number]        Accession #:    [id number] Date of Birth: 1961/01/01         Patient Gender: M Patient Age:   20 years Exam Location:  [facility] Procedure:      VAS US  LOWER EXTREMITY ARTERIAL DUPLEX Referring Phys: Sharla Davis --------------------------------------------------------------------------------  Indications: Peripheral artery disease.  Vascular  Interventions: 05/23/2019 PTA left PTA.                         05/30/2019 PTA right distal PTA.                         02/13/2020 PTA of right PTA.                         02/20/2020 Catheter placement of SFA. Current ABI:            Not Obtained Comparison Study: 11/05/2022 Performing Technologist: Tonie Franks RVS  Examination Guidelines: A complete evaluation includes B-mode imaging, spectral Doppler, color Doppler, and power Doppler as needed of all accessible portions of each vessel. Bilateral testing is considered an integral part of a complete examination. Limited examinations for reoccurring  indications may be performed as noted.  +----------+--------+-----+--------+---------+--------+ RIGHT     PSV cm/sRatioStenosisWaveform Comments +----------+--------+-----+--------+---------+--------+ CFA Distal67                   triphasic         +----------+--------+-----+--------+---------+--------+ DFA       65                   triphasic         +----------+--------+-----+--------+---------+--------+ SFA Prox  96                   triphasic         +----------+--------+-----+--------+---------+--------+ SFA Mid   107                  biphasic          +----------+--------+-----+--------+---------+--------+ SFA Distal63                   biphasic          +----------+--------+-----+--------+---------+--------+ POP Distal65                   biphasic          +----------+--------+-----+--------+---------+--------+ ATA Prox  65                   biphasic          +----------+--------+-----+--------+---------+--------+ PTA Prox  111                  biphasic          +----------+--------+-----+--------+---------+--------+  Summary: Right: Imaging and Waveforms obtained but limited due to Bandaged lower Leg (Mid Calf to Foot). Biphasic Waveforms seen predominantly in the Right Lower Extremity.  See table(s) above for measurements and observations.  Electronically signed by Mikki Alexander MD on 09/11/2023 at 9:49:43 AM.    Final        Assessment & Plan:   1. Hx of left BKA (HCC) (Primary) Continue with dressing changes as done by wound care at his facility  2. Delayed surgical wound healing of below-the-knee amputation stump (HCC) I had a very long discussion with the patient regarding his right below-knee amputation.  Previously there was concern for exposed bone but none is noted today but it is somewhat difficult to visualize.  I have again had a long discussion with the patient regarding treatment.  I strongly recommended that he undergo a above knee revision but the patient refuses to do so.  We have also discussed that the wound vacuum is the best chance of possible improvement but again he refuses.  Unfortunately, the patient has terrible insight into his ongoing condition and ongoing modifications for his care.     Current Outpatient Medications on File Prior to Visit  Medication Sig Dispense Refill   acetaminophen  (TYLENOL ) 325 MG tablet Take 650 mg by mouth 3 (three) times daily.      Amino Acids-Protein Hydrolys (PRO-STAT) LIQD Take 45 mLs by mouth daily.     ascorbic acid  (VITAMIN C ) 500 MG tablet Take 1 tablet (500 mg total) by mouth 2 (two) times daily.     aspirin  EC 81 MG tablet Take 1 tablet (81 mg total) by mouth daily. 150 tablet 2   atorvastatin  (LIPITOR) 10 MG tablet Take 1 tablet (10 mg total) by mouth daily. 30 tablet 11   Calcium  Carbonate-Vitamin D  (CALCIUM  600 +D HIGH POTENCY) 600-10 MG-MCG TABS Take 2 tablets by mouth  daily.     carboxymethylcellulose 1 % ophthalmic solution Place 1 drop into the left eye at bedtime.     clopidogrel  (PLAVIX ) 75 MG tablet Take 75 mg by mouth daily.     cyanocobalamin  1000 MCG tablet Take 1 tablet (1,000 mcg total) by mouth daily. 30 tablet 0   cyclobenzaprine  (FEXMID ) 7.5 MG tablet Take 15 mg by mouth at bedtime.     escitalopram  (LEXAPRO ) 5 MG tablet Take 5 mg by mouth daily.      ferrous fumarate  (FERRETTS) 325 (106 Fe) MG TABS tablet Take 1 tablet by mouth daily.     gabapentin  (NEURONTIN ) 300 MG capsule Take 300 mg by mouth 2 (two) times daily.     Magnesium  Chloride 64 MG TABS Take 64 mg by mouth 2 (two) times daily. 60 tablet 0   mirtazapine  (REMERON ) 7.5 MG tablet Take 7.5 mg by mouth at bedtime.     Multiple Vitamin (MULTIVITAMIN) tablet Take 1 tablet by mouth daily.     Oxycodone  HCl 10 MG TABS Take 1 tablet (10 mg total) by mouth every 4 (four) hours as needed. 12 tablet 0   oxymetazoline  (AFRIN) 0.05 % nasal spray Place 4 sprays into both nostrils every 6 (six) hours as needed (nose bleeds).     Polyethyl Glycol-Propyl Glycol (SYSTANE) 0.4-0.3 % SOLN Place 1 drop into both eyes daily.     senna-docusate (SENOKOT-S) 8.6-50 MG tablet Take 1 tablet by mouth daily.     Sofosbuvir -Velpatasvir  (EPCLUSA ) 400-100 MG TABS Take 1 tablet by mouth daily. 28 tablet 2   tamsulosin  (FLOMAX ) 0.4 MG CAPS capsule Take 1 capsule (0.4 mg total) by mouth daily after supper. 30 capsule 3   zinc oxide 20 % ointment Apply 1 Application topically in the morning and at bedtime. Apply to sacrum     Current Facility-Administered Medications on File Prior to Visit  Medication Dose Route Frequency Provider Last Rate Last Admin   heparin  lock flush 100 UNIT/ML injection             There are no Patient Instructions on file for this visit. No follow-ups on file.   Hiawatha Merriott E Emireth Cockerham, NP

## 2023-11-18 ENCOUNTER — Telehealth (INDEPENDENT_AMBULATORY_CARE_PROVIDER_SITE_OTHER): Payer: Self-pay

## 2023-11-18 NOTE — Telephone Encounter (Signed)
 This is being followed by infectious disease and they would need to reach out to them for these orders

## 2023-11-19 NOTE — Telephone Encounter (Signed)
 Spoke with Chad [name] and let him know to contact infectious disease he states understanding

## 2023-11-20 NOTE — Telephone Encounter (Signed)
 Correction patient has a port.  Per Dr. Francee Inch [facility] can de-access the port.  Verbal orders given to Jurline Olmsted with [facility].   Cell # for [name]- [phone] Jenesys Casseus Adel Holt, CMA

## 2023-11-24 ENCOUNTER — Inpatient Hospital Stay

## 2023-11-24 ENCOUNTER — Encounter: Payer: Self-pay | Admitting: Internal Medicine

## 2023-11-24 ENCOUNTER — Inpatient Hospital Stay: Admitting: Internal Medicine

## 2023-11-24 ENCOUNTER — Inpatient Hospital Stay: Attending: Internal Medicine

## 2023-11-24 VITALS — BP 111/83 | HR 92 | Temp 98.2°F | Resp 16 | Ht 69.0 in

## 2023-11-24 DIAGNOSIS — E559 Vitamin D deficiency, unspecified: Secondary | ICD-10-CM | POA: Diagnosis not present

## 2023-11-24 DIAGNOSIS — I739 Peripheral vascular disease, unspecified: Secondary | ICD-10-CM | POA: Diagnosis not present

## 2023-11-24 DIAGNOSIS — Z8042 Family history of malignant neoplasm of prostate: Secondary | ICD-10-CM | POA: Insufficient documentation

## 2023-11-24 DIAGNOSIS — R634 Abnormal weight loss: Secondary | ICD-10-CM

## 2023-11-24 DIAGNOSIS — L03116 Cellulitis of left lower limb: Secondary | ICD-10-CM | POA: Diagnosis not present

## 2023-11-24 DIAGNOSIS — Z8619 Personal history of other infectious and parasitic diseases: Secondary | ICD-10-CM

## 2023-11-24 DIAGNOSIS — Z806 Family history of leukemia: Secondary | ICD-10-CM | POA: Insufficient documentation

## 2023-11-24 DIAGNOSIS — B182 Chronic viral hepatitis C: Secondary | ICD-10-CM | POA: Insufficient documentation

## 2023-11-24 DIAGNOSIS — C187 Malignant neoplasm of sigmoid colon: Secondary | ICD-10-CM | POA: Insufficient documentation

## 2023-11-24 DIAGNOSIS — C661 Malignant neoplasm of right ureter: Secondary | ICD-10-CM

## 2023-11-24 DIAGNOSIS — Z8051 Family history of malignant neoplasm of kidney: Secondary | ICD-10-CM | POA: Insufficient documentation

## 2023-11-24 DIAGNOSIS — Z9221 Personal history of antineoplastic chemotherapy: Secondary | ICD-10-CM | POA: Diagnosis not present

## 2023-11-24 DIAGNOSIS — N210 Calculus in bladder: Secondary | ICD-10-CM | POA: Diagnosis not present

## 2023-11-24 DIAGNOSIS — F17210 Nicotine dependence, cigarettes, uncomplicated: Secondary | ICD-10-CM | POA: Insufficient documentation

## 2023-11-24 DIAGNOSIS — D509 Iron deficiency anemia, unspecified: Secondary | ICD-10-CM | POA: Diagnosis not present

## 2023-11-24 DIAGNOSIS — D500 Iron deficiency anemia secondary to blood loss (chronic): Secondary | ICD-10-CM

## 2023-11-24 LAB — CBC WITH DIFFERENTIAL (CANCER CENTER ONLY)
Abs Immature Granulocytes: 0.05 10*3/uL (ref 0.00–0.07)
Basophils Absolute: 0.1 10*3/uL (ref 0.0–0.1)
Basophils Relative: 1 %
Eosinophils Absolute: 0.7 10*3/uL — ABNORMAL HIGH (ref 0.0–0.5)
Eosinophils Relative: 8 %
HCT: 36.7 % — ABNORMAL LOW (ref 39.0–52.0)
Hemoglobin: 11.5 g/dL — ABNORMAL LOW (ref 13.0–17.0)
Immature Granulocytes: 1 %
Lymphocytes Relative: 15 %
Lymphs Abs: 1.3 10*3/uL (ref 0.7–4.0)
MCH: 26.8 pg (ref 26.0–34.0)
MCHC: 31.3 g/dL (ref 30.0–36.0)
MCV: 85.5 fL (ref 80.0–100.0)
Monocytes Absolute: 0.6 10*3/uL (ref 0.1–1.0)
Monocytes Relative: 7 %
Neutro Abs: 5.8 10*3/uL (ref 1.7–7.7)
Neutrophils Relative %: 68 %
Platelet Count: 267 10*3/uL (ref 150–400)
RBC: 4.29 MIL/uL (ref 4.22–5.81)
RDW: 16.7 % — ABNORMAL HIGH (ref 11.5–15.5)
WBC Count: 8.5 10*3/uL (ref 4.0–10.5)
nRBC: 0 % (ref 0.0–0.2)

## 2023-11-24 LAB — IRON AND TIBC
Iron: 31 ug/dL — ABNORMAL LOW (ref 45–182)
Saturation Ratios: 8 % — ABNORMAL LOW (ref 17.9–39.5)
TIBC: 375 ug/dL (ref 250–450)
UIBC: 344 ug/dL

## 2023-11-24 LAB — FERRITIN: Ferritin: 42 ng/mL (ref 24–336)

## 2023-11-24 LAB — CMP (CANCER CENTER ONLY)
ALT: 13 U/L (ref 0–44)
AST: 23 U/L (ref 15–41)
Albumin: 2.8 g/dL — ABNORMAL LOW (ref 3.5–5.0)
Alkaline Phosphatase: 136 U/L — ABNORMAL HIGH (ref 38–126)
Anion gap: 7 (ref 5–15)
BUN: 22 mg/dL (ref 8–23)
CO2: 17 mmol/L — ABNORMAL LOW (ref 22–32)
Calcium: 8.3 mg/dL — ABNORMAL LOW (ref 8.9–10.3)
Chloride: 110 mmol/L (ref 98–111)
Creatinine: 0.77 mg/dL (ref 0.61–1.24)
GFR, Estimated: >60 mL/min (ref >60)
Glucose, Bld: 111 mg/dL — ABNORMAL HIGH (ref 70–99)
Potassium: 4.5 mmol/L (ref 3.5–5.1)
Sodium: 134 mmol/L — ABNORMAL LOW (ref 135–145)
Total Bilirubin: 0.2 mg/dL (ref 0.0–1.2)
Total Protein: 6.9 g/dL (ref 6.5–8.1)

## 2023-11-24 LAB — VITAMIN D 25 HYDROXY (VIT D DEFICIENCY, FRACTURES): Vit D, 25-Hydroxy: 41.19 ng/mL (ref 30–100)

## 2023-11-24 MED ORDER — HEPARIN SOD (PORK) LOCK FLUSH 100 UNIT/ML IV SOLN
500.0000 [IU] | Freq: Once | INTRAVENOUS | Status: AC
  Administered 2023-11-24: 500 [IU] via INTRAVENOUS
  Filled 2023-11-24: qty 5

## 2023-11-24 NOTE — Assessment & Plan Note (Signed)
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 Most recently on keytruda ; currently on HOLD. JAN 3rd, 2025- Extensive surgical changes along the bowel once again identified with diverting colostomy and rectosigmoid stump; Severe atrophy of the right kidney with patulous collecting system and indwelling ureteral stent. Persistent asymmetric wall thickening along the urinary bladder posteriorly on the right side. Right renal and bladder calculi; Few scattered prominent lymph nodes seen once again in the chest, abdomen and pelvis. The majority are similar to previous. There is 1 node in left pelvic sidewall near the margin of the rectosigmoid stump which is slightly larger today at 10 mm.  # CONTINUE TO HOLD keytruda  for now [given acute issues].  Continue surveillance at this time. Will repeat CT scan in 3 months.   # # Anemia-  multifactorial anemia of chronic disease/IDA-s/p venofer - improved Hb 11.8- HOLD venofer  today.    # CT JAN 2025- incidental  developing parenchymal opacity identified along the dependent right lower lobe with some minimal areas seen in the right upper lobe- monitor for now. No clinical signs of pneumonia- will repeat CT scan in 3 months.   # PVD/ Left LE cellulitis- on antibiotics [would care]-  S/P LLE- BKA- on Aug 2024 [Dr.Schneir]-  MARCH 2025- amputation of Right LE-  [ vascular/ wound care-]- stable.    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 - ELEVEN LYMPH NODES, NEGATIVE FOR MALIGNANCY (0/11). - SURGICAL MARGINS FREE OF DYSPLASIA AND MALIGNANCY. S/p staple removal.-- stable. Await repeat CT scan in 3 months.   # Hepatitis C- [ID] on treatment.   # Hypocalcemia- Mild- on Ca+vit D; G+FEB 2024- 42. Stable  # active smoker:   Counseled against smoking; patient-not interested in quitting.   # IV access: port access   # DISPOSITION: # HOLD venofer ;  # follow up in 3  months- MD- port/labs/port flush--cbc/cmp;iron  studies; ferritin; Vit D 25-OH;  possible venofer ; DRI- CT CAP- Dr.B.

## 2023-11-24 NOTE — Progress Notes (Signed)
 Fatigue/weakness: NO Dyspena: NO  Light headedness: NO Blood in stool: NO   10/16/23 Infection of amputation stump.Per pt doing better.

## 2023-11-24 NOTE — Progress Notes (Signed)
 [facility] CONSULT NOTE  Patient Care Team: Pcp, No as PCP - General Gwyn Leos, MD as Consulting Physician (Internal Medicine) Marquita Situ, Magali Schmitz, MD as Consulting Physician (General Surgery)  CHIEF COMPLAINTS/PURPOSE OF CONSULTATION: Urothelial cancer  #  Oncology History Overview Note  # SEP-OCT 2019-right renal pelvis/ ureteral [cytology positive HIGH grade urothelial carcinoma [Dr.Brandon]    # NOV 24th 2019-Keytruda  [consent]  # April 2022- colonoscopy [Dr.Anna;incidental PET- sigmoid uptake] # Sigmoid colon cancer- [history of Lynch syndrome]- [Dr.Cintron; APRIL 2024]- s/p hemicolectomy- permanent ostomy; MUCINOUS ADENOCARCINOMA, 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  - ELEVEN LYMPH NODES, NEGATIVE FOR MALIGNANCY (0/11).  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  Is alone/in a wheelchair.  Chad [name] 63 y.o.  male with Lynch syndrome and above 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; recurrent sigmoid colon cancer -stage II and multiple other comorbidities including PVD/active  smoker most recently  Keytruda  is here for follow-up. Keytruda  is currently on HOLD given acute issues/PVD etc.   In the interim patient had BKA of the right lower extremity.  Wounds are healing well.  Is currently on antibiotics.  Denies any blood in stools or black or stools.  Appetite is good otherwise. Ostomy draining normally.  No hematuria.  No abd pain. Voiding normal   Review of Systems  Constitutional:  Positive for malaise/fatigue. Negative for chills, diaphoresis, fever and weight loss.  HENT:  Negative for nosebleeds and sore throat.   Eyes:  Negative for double vision.  Respiratory:  Positive for cough and shortness of breath. Negative for hemoptysis and wheezing.   Cardiovascular:  Negative for chest pain, palpitations and orthopnea.  Gastrointestinal:  Positive for constipation. Negative for abdominal pain, blood in stool, diarrhea, heartburn, melena, nausea and vomiting.  Genitourinary:  Negative for dysuria, frequency and urgency.  Musculoskeletal:  Positive for back pain and joint pain.  Skin: Negative.  Negative for itching and rash.  Neurological:  Positive for focal weakness. Negative for dizziness, tingling, weakness and headaches.       Chronic left-sided weakness upper than lower extremity.  Endo/Heme/Allergies:  Does not bruise/bleed easily.  Psychiatric/Behavioral:  Negative for depression. The patient is not nervous/anxious and does not have insomnia.      MEDICAL HISTORY:  Past Medical History:  Diagnosis Date   Anemia    Anxiety    ARF (acute respiratory failure) (HCC)    Atherosclerosis of arteries of extremities (HCC)    Benign prostatic hyperplasia with urinary obstruction    Bladder cancer (HCC)    BPH with obstruction/lower urinary tract symptoms  Chronic viral hepatitis C (HCC)    Colon cancer (HCC)    COPD (chronic obstructive pulmonary disease) (HCC)    Depression    Dysphagia    Family history of colon cancer    Family history of kidney  cancer    Family history of leukemia    Family history of prostate cancer    Generalized anxiety disorder    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    GERD (gastroesophageal reflux disease)    Hepatitis    chronic hep c   Hydronephrosis    Hydronephrosis with ureteral stricture    Hyperlipidemia    Insomnia, unspecified    Knee pain    Left   Major depressive disorder, recurrent, moderate (HCC)    Malignant neoplasm of colon (HCC)    Malnutrition (HCC)    Muscle weakness (generalized)    Nerve pain    Other abnormalities of gait and mobility    Other chronic pain    Peripheral vascular disease (HCC)    Personal history of transient cerebral ischemia    Pressure ulcer    Prostate cancer (HCC)    Stroke (HCC)    Tinea unguium    Tobacco user    Unspecified protein-calorie malnutrition (HCC)    Ureteral cancer, right (HCC)    Urinary frequency    Venous hypertension of both lower extremities    Xerosis cutis     SURGICAL HISTORY: Past Surgical History:  Procedure Laterality Date   AMPUTATION Left 03/03/2023   Procedure: AMPUTATION BELOW KNEE;  Surgeon: Jackquelyn Mass, MD;  Location: [facility];  Service: Vascular;  Laterality: Left;   AMPUTATION Right 09/30/2023   Procedure: AMPUTATION OF AMPUTATION, BELOW KNEE;  Surgeon: Jackquelyn Mass, MD;  Location: [facility];  Service: Vascular;  Laterality: Right;   APPLICATION OF WOUND VAC Right 10/13/2023   Procedure: APPLICATION, WOUND VAC;  Surgeon: Jackquelyn Mass, MD;  Location: [facility];  Service: Vascular;  Laterality: Right;   COLON SURGERY     En bloc extended right hemicolectomy 07/2017   COLONOSCOPY WITH PROPOFOL  N/A 11/06/2020   Procedure: COLONOSCOPY WITH PROPOFOL ;  Surgeon: Luke Salaam, MD;  Location: [facility]OS[facility];  Service: Gastroenterology;  Laterality: N/A;   COLONOSCOPY WITH PROPOFOL  N/A 07/31/2021   Procedure: COLONOSCOPY WITH PROPOFOL ;  Surgeon: Marshall Skeeter, MD;  Location: [facility] ENDOSCOPY;   Service: Endoscopy;  Laterality: N/A;   CYSTOSCOPY W/ RETROGRADES Right 08/30/2018   Procedure: CYSTOSCOPY WITH RETROGRADE PYELOGRAM;  Surgeon: Dustin Gimenez, MD;  Location: [facility];  Service: Urology;  Laterality: Right;   CYSTOSCOPY WITH STENT PLACEMENT Right 04/25/2018   Procedure: CYSTOSCOPY WITH STENT PLACEMENT;  Surgeon: Dustin Gimenez, MD;  Location: [facility];  Service: Urology;  Laterality: Right;   CYSTOSCOPY WITH STENT PLACEMENT Right 08/30/2018   Procedure: CYSTOSCOPY WITH STENT Exchange;  Surgeon: Dustin Gimenez, MD;  Location: [facility];  Service: Urology;  Laterality: Right;   CYSTOSCOPY WITH STENT PLACEMENT Right 03/07/2019   Procedure: CYSTOSCOPY WITH STENT Exchange;  Surgeon: Dustin Gimenez, MD;  Location: [facility];  Service: Urology;  Laterality: Right;   CYSTOSCOPY WITH STENT PLACEMENT Right 11/21/2019   Procedure: CYSTOSCOPY WITH STENT Exchange;  Surgeon: Dustin Gimenez, MD;  Location: [facility];  Service: Urology;  Laterality: Right;   INCISION AND DRAINAGE OF DEEP ABSCESS, KNEE Right 10/11/2023   Procedure: INCISION AND DRAINAGE OF DEEP ABSCESS, KNEE;  Surgeon: Jerri Morale, MD;  Location: [facility];  Service: Vascular;  Laterality: Right;  INCISION AND DRAINAGE OF WOUND Right 10/10/2023   Procedure: IRRIGATION AND DEBRIDEMENT WOUND RIGHT BKA;  Surgeon: Jerri Morale, MD;  Location: [facility];  Service: Vascular;  Laterality: Right;   INCISION AND DRAINAGE OF WOUND Right 10/13/2023   Procedure: IRRIGATION AND DEBRIDEMENT WOUND;  Surgeon: Jackquelyn Mass, MD;  Location: [facility];  Service: Vascular;  Laterality: Right;   LOWER EXTREMITY ANGIOGRAPHY Left 05/23/2019   Procedure: LOWER EXTREMITY ANGIOGRAPHY;  Surgeon: Celso College, MD;  Location: [facility];  Service: Cardiovascular;  Laterality: Left;   LOWER EXTREMITY ANGIOGRAPHY Right 05/30/2019   Procedure: LOWER EXTREMITY ANGIOGRAPHY;  Surgeon: Celso College, MD;  Location: [facility];  Service:  Cardiovascular;  Laterality: Right;   LOWER EXTREMITY ANGIOGRAPHY Right 02/13/2020   Procedure: LOWER EXTREMITY ANGIOGRAPHY;  Surgeon: Celso College, MD;  Location: [facility];  Service: Cardiovascular;  Laterality: Right;   LOWER EXTREMITY ANGIOGRAPHY Left 02/20/2020   Procedure: LOWER EXTREMITY ANGIOGRAPHY;  Surgeon: Celso College, MD;  Location: [facility];  Service: Cardiovascular;  Laterality: Left;   LOWER EXTREMITY ANGIOGRAPHY Left 01/01/2023   Procedure: Lower Extremity Angiography;  Surgeon: Celso College, MD;  Location: [facility];  Service: Cardiovascular;  Laterality: Left;   PORTA CATH INSERTION N/A 02/28/2019   Procedure: PORTA CATH INSERTION;  Surgeon: Celso College, MD;  Location: [facility];  Service: Cardiovascular;  Laterality: N/A;   ROBOT ASSISTED LAPAROSCOPIC PARTIAL COLECTOMY  11/17/2022   tumor removed       SOCIAL HISTORY: Social History   Socioeconomic History   Marital status: Single    Spouse name: Not on file   Number of children: Not on file   Years of education: Not on file   Highest education level: Not on file  Occupational History   Not on file  Tobacco Use   Smoking status: Every Day    Current packs/day: 0.50    Types: Cigarettes    Passive exposure: Never   Smokeless tobacco: Never  Vaping Use   Vaping status: Never Used  Substance and Sexual Activity   Alcohol  use: Not Currently   Drug use: Not Currently    Types: Marijuana, Methylphenidate    Comment: quit 1997-1998 ish   Sexual activity: Not Currently  Other Topics Concern   Not on file  Social History Narrative    used [place] live [place]; moved  [place]- end of April 2019; in Nursing home; 1pp/day; quit alcohol . 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        Family- dad- prostate ca [at 78y]; brother- 98 died of prostate cancer; brother- 50- no cancers [[place]]; sonLarinda Plover [[place]];[id number] prostate ca [[place] ]; daughter- 81 [[place]]; another daughter 76  [[place]/addict]. will refer genetics counseling. Given MSI- abnormal; highly suspicious of Lynch syndrome.  Patient's son [name] aware of high possible lynch syndrome.   Social Drivers of Corporate investment banker Strain: Not on file  Food Insecurity: No Food Insecurity (10/13/2023)   Hunger Vital Sign    Worried About Running Out of Food in the Last Year: Never true    Ran Out of Food in the Last Year: Never true  Transportation Needs: No Transportation Needs (10/13/2023)   PRAPARE - Administrator, Civil Service (Medical): No    Lack of Transportation (Non-Medical): No  Physical Activity: Not on file  Stress: Not on file  Social Connections: Moderately Integrated (  10/10/2023)   Social Connection and Isolation Panel [NHANES]    Frequency of Communication with Friends and Family: Twice a week    Frequency of Social Gatherings with Friends and Family: Twice a week    Attends Religious Services: 1 to 4 times per year    Active Member of [employer] or Organizations: No    Attends Engineer, structural: 1 to 4 times per year    Marital Status: Never married  Intimate Partner Violence: Not At Risk (10/13/2023)   Humiliation, Afraid, Rape, and Kick questionnaire    Fear of Current or Ex-Partner: No    Emotionally Abused: No    Physically Abused: No    Sexually Abused: No    FAMILY HISTORY: Family History  Problem Relation Age of Onset   Prostate cancer Father 45   Cancer Brother 56       unsure type   Cancer Paternal Uncle        unsure type   Cancer Maternal Grandmother        unsure type   Cancer Paternal Grandmother        unsure type   Kidney cancer Paternal Grandfather    Cancer Other        unsure types   Leukemia Son    Cancer Son        other cancers, possibly colon    ALLERGIES:  is allergic to penicillins.  MEDICATIONS:  Current Outpatient Medications  Medication Sig Dispense Refill   acetaminophen  (TYLENOL ) 325 MG tablet Take 650 mg by mouth 3  (three) times daily.      Amino Acids-Protein Hydrolys (PRO-STAT) LIQD Take 45 mLs by mouth daily.     ascorbic acid  (VITAMIN C ) 500 MG tablet Take 1 tablet (500 mg total) by mouth 2 (two) times daily.     aspirin  EC 81 MG tablet Take 1 tablet (81 mg total) by mouth daily. 150 tablet 2   atorvastatin  (LIPITOR) 10 MG tablet Take 1 tablet (10 mg total) by mouth daily. 30 tablet 11   carboxymethylcellulose 1 % ophthalmic solution Place 1 drop into the left eye at bedtime.     clopidogrel  (PLAVIX ) 75 MG tablet Take 75 mg by mouth daily.     cyanocobalamin  1000 MCG tablet Take 1 tablet (1,000 mcg total) by mouth daily. 30 tablet 0   cyclobenzaprine  (FEXMID ) 7.5 MG tablet Take 15 mg by mouth at bedtime.     ferrous fumarate  (FERRETTS) 325 (106 Fe) MG TABS tablet Take 1 tablet by mouth daily.     gabapentin  (NEURONTIN ) 300 MG capsule Take 300 mg by mouth 2 (two) times daily.     Magnesium  Chloride 64 MG TABS Take 64 mg by mouth 2 (two) times daily. 60 tablet 0   mirtazapine  (REMERON ) 7.5 MG tablet Take 7.5 mg by mouth at bedtime.     Multiple Vitamin (MULTIVITAMIN) tablet Take 1 tablet by mouth daily.     Oxycodone  HCl 10 MG TABS Take 1 tablet (10 mg total) by mouth every 4 (four) hours as needed. 12 tablet 0   oxymetazoline  (AFRIN) 0.05 % nasal spray Place 4 sprays into both nostrils every 6 (six) hours as needed (nose bleeds).     Polyethyl Glycol-Propyl Glycol (SYSTANE) 0.4-0.3 % SOLN Place 1 drop into both eyes daily.     senna-docusate (SENOKOT-S) 8.6-50 MG tablet Take 1 tablet by mouth daily.     Sofosbuvir -Velpatasvir  (EPCLUSA ) 400-100 MG TABS Take 1 tablet by mouth daily. 28  tablet 2   tamsulosin  (FLOMAX ) 0.4 MG CAPS capsule Take 1 capsule (0.4 mg total) by mouth daily after supper. 30 capsule 3   zinc oxide 20 % ointment Apply 1 Application topically in the morning and at bedtime. Apply to sacrum     Calcium  Carbonate-Vitamin D  (CALCIUM  600 +D HIGH POTENCY) 600-10 MG-MCG TABS Take 2 tablets by  mouth daily. (Patient not taking: Reported on 11/24/2023)     escitalopram  (LEXAPRO ) 5 MG tablet Take 5 mg by mouth daily. (Patient not taking: Reported on 11/24/2023)     No current facility-administered medications for this visit.   Facility-Administered Medications Ordered in Other Visits  Medication Dose Route Frequency Provider Last Rate Last Admin   heparin  lock flush 100 UNIT/ML injection               .  PHYSICAL EXAMINATION: ECOG PERFORMANCE STATUS: 1 - Symptomatic but completely ambulatory  Vitals:   11/24/23 1411  BP: 111/83  Pulse: 92  Resp: 16  Temp: 98.2 F (36.8 C)  SpO2: 96%     Filed Weights   Bilateral BKA.  Frail-appearing male patient.  Physical Exam HENT:     Head: Normocephalic and atraumatic.     Mouth/Throat:     Pharynx: No oropharyngeal exudate.  Eyes:     Pupils: Pupils are equal, round, and reactive to light.  Cardiovascular:     Rate and Rhythm: Normal rate and regular rhythm.  Pulmonary:     Effort: No respiratory distress.     Breath sounds: No wheezing.  Abdominal:     General: Bowel sounds are normal. There is no distension.     Palpations: Abdomen is soft. There is no mass.     Tenderness: There is no abdominal tenderness. There is no guarding or rebound.  Musculoskeletal:        General: No tenderness. Normal range of motion.     Cervical back: Normal range of motion and neck supple.  Skin:    General: Skin is warm.  Neurological:     Mental Status: He is oriented to person, place, and time.  Psychiatric:        Mood and Affect: Affect normal.      LABORATORY DATA:  I have reviewed the data as listed Lab Results  Component Value Date   WBC 8.5 11/24/2023   HGB 11.5 (L) 11/24/2023   HCT 36.7 (L) 11/24/2023   MCV 85.5 11/24/2023   PLT 267 11/24/2023   Recent Labs    10/01/23 1642 10/02/23 0547 10/09/23 1945 10/10/23 0514 10/15/23 0553 10/16/23 0620 11/24/23 1412  NA 133*   < > 131*   < > 134* 136 134*  K  3.2*   < > 4.3   < > 4.0 4.0 4.5  CL 103   < > 105   < > 108 104 110  CO2 23   < > 18*   < > 23 25 17*  GLUCOSE 193*   < > 117*   < > 93 103* 111*  BUN 15   < > 31*   < > 21 21 22   CREATININE 0.96   < > 1.12   < > 1.10 1.04 0.77  CALCIUM  7.8*   < > 8.7*   < > 8.9 9.0 8.3*  GFRNONAA >60   < > >60   < > >60 >60 >60  PROT 5.9*  --  7.4  --   --   --  6.9  ALBUMIN 2.1*  --  2.5*  --   --   --  2.8*  AST 47*  --  29  --   --   --  23  ALT 17  --  25  --   --   --  13  ALKPHOS 90  --  145*  --   --   --  136*  BILITOT 0.6  --  0.6  --   --   --  0.2   < > = values in this interval not displayed.    ASSESSMENT & PLAN:   Ureteral cancer, right (HCC) # High-grade urothelial cancer/cytology; likely of the right renal pelvis /upper ureter. Most recently on keytruda ; currently on HOLD. JAN 3rd, 2025- Extensive surgical changes along the bowel once again identified with diverting colostomy and rectosigmoid stump; Severe atrophy of the right kidney with patulous collecting system and indwelling ureteral stent. Persistent asymmetric wall thickening along the urinary bladder posteriorly on the right side. Right renal and bladder calculi; Few scattered prominent lymph nodes seen once again in the chest, abdomen and pelvis. The majority are similar to previous. There is 1 node in left pelvic sidewall near the margin of the rectosigmoid stump which is slightly larger today at 10 mm.  # CONTINUE TO HOLD keytruda  for now [given acute issues].  Continue surveillance at this time. Will repeat CT scan in 3 months.   # # Anemia-  multifactorial anemia of chronic disease/IDA-s/p venofer - improved Hb 11.8- HOLD venofer  today.    # CT JAN 2025- incidental  developing parenchymal opacity identified along the dependent right lower lobe with some minimal areas seen in the right upper lobe- monitor for now. No clinical signs of pneumonia- will repeat CT scan in 3 months.   # PVD/ Left LE cellulitis- on antibiotics  [would care]-  S/P LLE- BKA- on Aug 2024 [Dr.Schneir]-  MARCH 2025- amputation of Right LE-  [ vascular/ wound care-]- stable.    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 - ELEVEN LYMPH NODES, NEGATIVE FOR MALIGNANCY (0/11). - SURGICAL MARGINS FREE OF DYSPLASIA AND MALIGNANCY. S/p staple removal.-- stable. Await repeat CT scan in 3 months.   # Hepatitis C- [ID] on treatment.   # Hypocalcemia- Mild- on Ca+vit D; G+FEB 2024- 42. Stable  # active smoker:  Counseled against smoking; patient-not interested in quitting.   # IV access: port access   # DISPOSITION: # HOLD venofer ;  # follow up in 3  months- MD- port/labs/port flush--cbc/cmp;iron  studies; ferritin; Vit D 25-OH;  possible venofer ; [facility]- CT CAP- Dr.B.    All questions were answered. The patient knows to call the clinic with any problems, questions or concerns.    Gwyn Leos, MD 11/24/2023 3:28 PM

## 2023-11-24 NOTE — Patient Instructions (Signed)

## 2023-11-26 ENCOUNTER — Telehealth: Payer: Self-pay | Admitting: *Deleted

## 2023-11-26 ENCOUNTER — Other Ambulatory Visit: Payer: Self-pay | Admitting: *Deleted

## 2023-11-26 DIAGNOSIS — C187 Malignant neoplasm of sigmoid colon: Secondary | ICD-10-CM

## 2023-11-26 DIAGNOSIS — C661 Malignant neoplasm of right ureter: Secondary | ICD-10-CM

## 2023-11-26 NOTE — Telephone Encounter (Signed)
 Tosha from [facility] called today saying that there was a scan for the patient to have an it was through [facility] and according to them he always gets it here at the [place] [facility].  She asked us  to make sure it gets done at [facility] and not [facility].

## 2023-12-04 ENCOUNTER — Ambulatory Visit

## 2023-12-08 ENCOUNTER — Telehealth: Payer: Self-pay

## 2023-12-08 NOTE — Telephone Encounter (Signed)
 Jazmine with [facility] called stating that they're still doing weekly labs on patient. Patient IV abx ended on 4/15 and the patient no longer has a wound vac. Wanting to know if they still need to do weekly labs.   Per Diminque Gainey,CMA - Dr.Ravinshankar no longer needs weekly labs.    Contacted Designer, television/film set and informed Shelvy Dickens, Charity fundraiser to relay message to Jazmine.   Diesel Lina Roann Chestnut, CMA

## 2023-12-15 ENCOUNTER — Other Ambulatory Visit (INDEPENDENT_AMBULATORY_CARE_PROVIDER_SITE_OTHER): Payer: Self-pay | Admitting: Vascular Surgery

## 2023-12-15 ENCOUNTER — Ambulatory Visit (INDEPENDENT_AMBULATORY_CARE_PROVIDER_SITE_OTHER): Admitting: Vascular Surgery

## 2023-12-15 VITALS — BP 108/71 | HR 92 | Resp 19

## 2023-12-15 DIAGNOSIS — T8789 Other complications of amputation stump: Secondary | ICD-10-CM

## 2023-12-15 DIAGNOSIS — Z89512 Acquired absence of left leg below knee: Secondary | ICD-10-CM

## 2023-12-15 DIAGNOSIS — T8189XA Other complications of procedures, not elsewhere classified, initial encounter: Secondary | ICD-10-CM

## 2023-12-15 MED ORDER — SULFAMETHOXAZOLE-TRIMETHOPRIM 800-160 MG PO TABS
1.0000 | ORAL_TABLET | Freq: Two times a day (BID) | ORAL | Status: AC
Start: 2023-12-15 — End: 2023-12-29

## 2023-12-16 ENCOUNTER — Encounter (INDEPENDENT_AMBULATORY_CARE_PROVIDER_SITE_OTHER): Payer: Self-pay | Admitting: Vascular Surgery

## 2023-12-16 NOTE — Progress Notes (Signed)
 Subjective:    Patient ID: Chad [name], male    DOB: 01-25-1961, 63 y.o.   MRN: [id number] Chief Complaint  Patient presents with   Venous Insufficiency    The patient presents today for evaluation of left and right below-knee amputation site.  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  This has been treated by the wound care at his facility and is improving.  However on his right he developed an abscess and this was debrided multiple times.  However despite that the wound still remains largely open and there is actually bone exposed.  We advocated for the patient to undergo an above-knee amputation revision but he has refused.  Since that time he has been treated with antibiotics and continues to be treated with antibiotics.  The patient previously had a wound VAC in place but he had been removing his at his facility.  He notes that he did not want any more and refused to continue with issues.  Today the wound bed is red and beefy but the wound is still fairly wide open with little improvement but without any significant improvement.    Review of Systems  Constitutional: Negative.   Skin:  Positive for wound.       Bilateral skin wounds to the lower extremity stumps  All other systems reviewed and are negative.      Objective:    Physical Exam Constitutional:      Appearance: Normal appearance. He is normal weight.  HENT:     Head: Normocephalic.  Eyes:     Pupils: Pupils are equal, round, and reactive to light.  Cardiovascular:     Rate and Rhythm: Normal rate.     Pulses: Normal pulses.     Heart sounds: Normal heart sounds.  Pulmonary:     Effort: Pulmonary effort is normal.     Breath sounds: Normal breath sounds.  Abdominal:     General: Abdomen is flat. Bowel sounds are normal.     Palpations: Abdomen is  soft.  Musculoskeletal:     Comments: Wounds to bilateral stumps. Difficult Skin Healing.     Right Lower Extremity: Right leg is amputated below knee.     Left Lower Extremity: Left leg is amputated below knee.  Skin:    General: Skin is warm and dry.  Neurological:     General: No focal deficit present.     Mental Status: He is alert and oriented to person, place, and time. Mental status is at baseline.  Psychiatric:        Mood and Affect: Mood normal.        Behavior: Behavior normal.        Thought Content: Thought content normal.        Judgment: Judgment normal.     BP 108/71 (BP Location: Right Arm, Patient Position: Sitting, Cuff Size: Normal)   Pulse 92   Resp 19   Past Medical History:  Diagnosis Date   Anemia    Anxiety    ARF (acute respiratory failure) (HCC)    Atherosclerosis of arteries of extremities (HCC)    Benign prostatic hyperplasia with urinary obstruction    Bladder cancer (HCC)    BPH with obstruction/lower urinary tract symptoms    Chronic viral hepatitis C (HCC)  Colon cancer (HCC)    COPD (chronic obstructive pulmonary disease) (HCC)    Depression    Dysphagia    Family history of colon cancer    Family history of kidney cancer    Family history of leukemia    Family history of prostate cancer    Generalized anxiety disorder    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    GERD (gastroesophageal reflux disease)    Hepatitis    chronic hep c   Hydronephrosis    Hydronephrosis with ureteral stricture    Hyperlipidemia    Insomnia, unspecified    Knee pain    Left   Major depressive disorder, recurrent, moderate (HCC)    Malignant neoplasm of colon (HCC)    Malnutrition (HCC)    Muscle weakness (generalized)    Nerve pain    Other abnormalities of gait and mobility    Other chronic pain    Peripheral vascular disease (HCC)    Personal history of transient cerebral ischemia    Pressure ulcer    Prostate cancer (HCC)     Stroke (HCC)    Tinea unguium    Tobacco user    Unspecified protein-calorie malnutrition (HCC)    Ureteral cancer, right (HCC)    Urinary frequency    Venous hypertension of both lower extremities    Xerosis cutis     Social History   Socioeconomic History   Marital status: Single    Spouse name: Not on file   Number of children: Not on file   Years of education: Not on file   Highest education level: Not on file  Occupational History   Not on file  Tobacco Use   Smoking status: Every Day    Current packs/day: 0.50    Types: Cigarettes    Passive exposure: Never   Smokeless tobacco: Never  Vaping Use   Vaping status: Never Used  Substance and Sexual Activity   Alcohol  use: Not Currently   Drug use: Not Currently    Types: Marijuana, Methylphenidate    Comment: quit 1997-1998 ish   Sexual activity: Not Currently  Other Topics Concern   Not on file  Social History Narrative    used to live [place]; moved  To [place]- end of April 2019; in Nursing home; 1pp/day; quit alcohol . 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        Family- dad- prostate ca [at 78y]; brother- 53 died of prostate cancer; brother- 23- no cancers [[place]]; sonLarinda Plover [[place]];[id number] prostate ca Iris.[place] ]; daughter- 53 [[place]]; another daughter 1 [[place]/addict]. will refer genetics counseling. Given MSI- abnormal; highly suspicious of Lynch syndrome.  Patient's son [name] aware of high possible lynch syndrome.   Social Drivers of Corporate investment banker Strain: Not on file  Food Insecurity: No Food Insecurity (10/13/2023)   Hunger Vital Sign    Worried About Running Out of Food in the Last Year: Never true    Ran Out of Food in the Last Year: Never true  Transportation Needs: No Transportation Needs (10/13/2023)   PRAPARE - Administrator, Civil Service (Medical): No    Lack of Transportation (Non-Medical): No  Physical Activity: Not on file  Stress: Not on file  Social Connections: Moderately  Integrated (10/10/2023)   Social Connection and Isolation Panel [NHANES]    Frequency of Communication with Friends and Family: Twice a week    Frequency of Social Gatherings with Friends and Family: Twice  a week    Attends Religious Services: 1 to 4 times per year    Active Member of Clubs or Organizations: No    Attends Banker Meetings: 1 to 4 times per year    Marital Status: Never married  Intimate Partner Violence: Not At Risk (10/13/2023)   Humiliation, Afraid, Rape, and Kick questionnaire    Fear of Current or Ex-Partner: No    Emotionally Abused: No    Physically Abused: No    Sexually Abused: No    Past Surgical History:  Procedure Laterality Date   AMPUTATION Left 03/03/2023   Procedure: AMPUTATION BELOW KNEE;  Surgeon: Jackquelyn Mass, MD;  Location: [facility];  Service: Vascular;  Laterality: Left;   AMPUTATION Right 09/30/2023   Procedure: AMPUTATION OF AMPUTATION, BELOW KNEE;  Surgeon: Jackquelyn Mass, MD;  Location: [facility];  Service: Vascular;  Laterality: Right;   APPLICATION OF WOUND VAC Right 10/13/2023   Procedure: APPLICATION, WOUND VAC;  Surgeon: Jackquelyn Mass, MD;  Location: [facility];  Service: Vascular;  Laterality: Right;   COLON SURGERY     En bloc extended right hemicolectomy 07/2017   COLONOSCOPY WITH PROPOFOL  N/A 11/06/2020   Procedure: COLONOSCOPY WITH PROPOFOL ;  Surgeon: Luke Salaam, MD;  Location[facility];  Service: Gastroenterology;  Laterality: N/A;   COLONOSCOPY WITH PROPOFOL  N/A 07/31/2021   Procedure: COLONOSCOPY WITH PROPOFOL ;  Surgeon: Marshall Skeeter, MD;  Location: [facility];  Service: Endoscopy;  Laterality: N/A;   CYSTOSCOPY W/ RETROGRADES Right 08/30/2018   Procedure: CYSTOSCOPY WITH RETROGRADE PYELOGRAM;  Surgeon: Dustin Gimenez, MD;  Location: [facility];  Service: Urology;  Laterality: Right;   CYSTOSCOPY WITH STENT PLACEMENT Right 04/25/2018   Procedure: CYSTOSCOPY WITH STENT PLACEMENT;  Surgeon: Dustin Gimenez, MD;  Location: [facility];  Service: Urology;  Laterality: Right;   CYSTOSCOPY WITH STENT PLACEMENT Right 08/30/2018   Procedure: CYSTOSCOPY WITH STENT Exchange;  Surgeon: Dustin Gimenez, MD;  Location: [facility];  Service: Urology;  Laterality: Right;   CYSTOSCOPY WITH STENT PLACEMENT Right 03/07/2019   Procedure: CYSTOSCOPY WITH STENT Exchange;  Surgeon: Dustin Gimenez, MD;  Location: [facility];  Service: Urology;  Laterality: Right;   CYSTOSCOPY WITH STENT PLACEMENT Right 11/21/2019   Procedure: CYSTOSCOPY WITH STENT Exchange;  Surgeon: Dustin Gimenez, MD;  Location: [facility];  Service: Urology;  Laterality: Right;   INCISION AND DRAINAGE OF DEEP ABSCESS, KNEE Right 10/11/2023   Procedure: INCISION AND DRAINAGE OF DEEP ABSCESS, KNEE;  Surgeon: Jerri Morale, MD;  Location: [facility];  Service: Vascular;  Laterality: Right;   INCISION AND DRAINAGE OF WOUND Right 10/10/2023   Procedure: IRRIGATION AND DEBRIDEMENT WOUND RIGHT BKA;  Surgeon: Jerri Morale, MD;  Location: [facility];  Service: Vascular;  Laterality: Right;   INCISION AND DRAINAGE OF WOUND Right 10/13/2023   Procedure: IRRIGATION AND DEBRIDEMENT WOUND;  Surgeon: Jackquelyn Mass, MD;  Location: [facility];  Service: Vascular;  Laterality: Right;   LOWER EXTREMITY ANGIOGRAPHY Left 05/23/2019   Procedure: LOWER EXTREMITY ANGIOGRAPHY;  Surgeon: Celso College, MD;  Location: [facility];  Service: Cardiovascular;  Laterality: Left;   LOWER EXTREMITY ANGIOGRAPHY Right 05/30/2019   Procedure: LOWER EXTREMITY ANGIOGRAPHY;  Surgeon: Celso College, MD;  Location: [facility];  Service: Cardiovascular;  Laterality: Right;   LOWER EXTREMITY ANGIOGRAPHY Right 02/13/2020   Procedure: LOWER EXTREMITY ANGIOGRAPHY;  Surgeon: Celso College, MD;  Location: [facility];  Service: Cardiovascular;  Laterality:  Right;   LOWER EXTREMITY ANGIOGRAPHY Left 02/20/2020   Procedure: LOWER EXTREMITY ANGIOGRAPHY;  Surgeon: Celso College, MD;   Location: [facility];  Service: Cardiovascular;  Laterality: Left;   LOWER EXTREMITY ANGIOGRAPHY Left 01/01/2023   Procedure: Lower Extremity Angiography;  Surgeon: Celso College, MD;  Location: [facility];  Service: Cardiovascular;  Laterality: Left;   PORTA CATH INSERTION N/A 02/28/2019   Procedure: PORTA CATH INSERTION;  Surgeon: Celso College, MD;  Location: [facility];  Service: Cardiovascular;  Laterality: N/A;   ROBOT ASSISTED LAPAROSCOPIC PARTIAL COLECTOMY  11/17/2022   tumor removed       Family History  Problem Relation Age of Onset   Prostate cancer Father 32   Cancer Brother 24       unsure type   Cancer Paternal Uncle        unsure type   Cancer Maternal Grandmother        unsure type   Cancer Paternal Grandmother        unsure type   Kidney cancer Paternal Grandfather    Cancer Other        unsure types   Leukemia Son    Cancer Son        other cancers, possibly colon    Allergies  Allergen Reactions   Penicillins Rash    Tolerated piperacillin /tazobactam in Aug 2024       Latest Ref Rng & Units 11/24/2023    2:12 PM 10/16/2023    6:20 AM 10/15/2023    5:53 AM  CBC  WBC 4.0 - 10.5 K/uL 8.5  12.4  12.8   Hemoglobin 13.0 - 17.0 g/dL 16.1  8.8  8.5   Hematocrit 39.0 - 52.0 % 36.7  27.4  26.5   Platelets 150 - 400 K/uL 267  351  348        CMP     Component Value Date/Time   NA 134 (L) 11/24/2023 1412   K 4.5 11/24/2023 1412   CL 110 11/24/2023 1412   CO2 17 (L) 11/24/2023 1412   GLUCOSE 111 (H) 11/24/2023 1412   BUN 22 11/24/2023 1412   CREATININE 0.77 11/24/2023 1412   CREATININE 1.07 07/01/2023 1359   CALCIUM  8.3 (L) 11/24/2023 1412   PROT 6.9 11/24/2023 1412   ALBUMIN 2.8 (L) 11/24/2023 1412   AST 23 11/24/2023 1412   ALT 13 11/24/2023 1412   ALT 10 07/01/2023 1359   ALKPHOS 136 (H) 11/24/2023 1412   BILITOT 0.2 11/24/2023 1412   EGFR 275.3 11/09/2023 0000   GFRNONAA >60 11/24/2023 1412     No results  found.     Assessment & Plan:   1. Hx of left BKA (HCC) (Primary) Continue with dressing changes as done by wound care at his facility   2. Delayed surgical wound healing of below-the-knee amputation stump (HCC) I had a very long discussion with the patient regarding his right below-knee amputation.  Previously there was concern for exposed bone but none is noted today but it is somewhat difficult to visualize.  I have again had a long discussion with the patient regarding treatment.  I strongly recommended that he undergo a above knee revision but the patient refuses to do so.  We have also discussed that the wound vacuum is the best chance of possible improvement but again he refuses.  Unfortunately, the patient has terrible insight into his ongoing condition and ongoing modifications for his care.  Current Outpatient Medications on File Prior to Visit  Medication Sig Dispense Refill   acetaminophen  (TYLENOL ) 325 MG tablet Take 650 mg by mouth 3 (three) times daily.      Amino Acids-Protein Hydrolys (PRO-STAT) LIQD Take 45 mLs by mouth daily.     ascorbic acid  (VITAMIN C ) 500 MG tablet Take 1 tablet (500 mg total) by mouth 2 (two) times daily.     aspirin  EC 81 MG tablet Take 1 tablet (81 mg total) by mouth daily. 150 tablet 2   Calcium  Carbonate-Vitamin D  (CALCIUM  600 +D HIGH POTENCY) 600-10 MG-MCG TABS Take 2 tablets by mouth daily.     carboxymethylcellulose 1 % ophthalmic solution Place 1 drop into the left eye at bedtime.     clopidogrel  (PLAVIX ) 75 MG tablet Take 75 mg by mouth daily.     cyanocobalamin  1000 MCG tablet Take 1 tablet (1,000 mcg total) by mouth daily. 30 tablet 0   cyclobenzaprine  (FEXMID ) 7.5 MG tablet Take 15 mg by mouth at bedtime.     escitalopram  (LEXAPRO ) 5 MG tablet Take 5 mg by mouth daily.     ferrous fumarate  (FERRETTS) 325 (106 Fe) MG TABS tablet Take 1 tablet by mouth daily.     gabapentin  (NEURONTIN ) 300 MG capsule Take 300 mg by mouth 2 (two) times  daily.     Magnesium  Chloride 64 MG TABS Take 64 mg by mouth 2 (two) times daily. 60 tablet 0   mirtazapine  (REMERON ) 7.5 MG tablet Take 7.5 mg by mouth at bedtime.     Multiple Vitamin (MULTIVITAMIN) tablet Take 1 tablet by mouth daily.     Oxycodone  HCl 10 MG TABS Take 1 tablet (10 mg total) by mouth every 4 (four) hours as needed. 12 tablet 0   oxymetazoline  (AFRIN) 0.05 % nasal spray Place 4 sprays into both nostrils every 6 (six) hours as needed (nose bleeds).     Polyethyl Glycol-Propyl Glycol (SYSTANE) 0.4-0.3 % SOLN Place 1 drop into both eyes daily.     senna-docusate (SENOKOT-S) 8.6-50 MG tablet Take 1 tablet by mouth daily.     Sofosbuvir -Velpatasvir  (EPCLUSA ) 400-100 MG TABS Take 1 tablet by mouth daily. 28 tablet 2   tamsulosin  (FLOMAX ) 0.4 MG CAPS capsule Take 1 capsule (0.4 mg total) by mouth daily after supper. 30 capsule 3   zinc oxide 20 % ointment Apply 1 Application topically in the morning and at bedtime. Apply to sacrum     atorvastatin  (LIPITOR) 10 MG tablet Take 1 tablet (10 mg total) by mouth daily. (Patient not taking: Reported on 12/15/2023) 30 tablet 11   Current Facility-Administered Medications on File Prior to Visit  Medication Dose Route Frequency Provider Last Rate Last Admin   heparin  lock flush 100 UNIT/ML injection             There are no Patient Instructions on file for this visit. No follow-ups on file.   Annamaria Barrette, NP

## 2023-12-19 LAB — AEROBIC CULTURE

## 2024-01-04 ENCOUNTER — Encounter: Payer: Self-pay | Admitting: Internal Medicine

## 2024-01-15 ENCOUNTER — Encounter (INDEPENDENT_AMBULATORY_CARE_PROVIDER_SITE_OTHER): Payer: Self-pay | Admitting: Vascular Surgery

## 2024-01-15 ENCOUNTER — Ambulatory Visit (INDEPENDENT_AMBULATORY_CARE_PROVIDER_SITE_OTHER): Admitting: Vascular Surgery

## 2024-01-15 VITALS — BP 109/73 | HR 86 | Resp 16

## 2024-01-15 DIAGNOSIS — Z89512 Acquired absence of left leg below knee: Secondary | ICD-10-CM

## 2024-01-15 DIAGNOSIS — T8189XA Other complications of procedures, not elsewhere classified, initial encounter: Secondary | ICD-10-CM

## 2024-01-15 MED ORDER — CIPROFLOXACIN HCL 500 MG PO TABS: 500.0000 mg | ORAL_TABLET | Freq: Two times a day (BID) | ORAL | 0 refills | Status: AC

## 2024-01-15 NOTE — Progress Notes (Unsigned)
 Subjective:    Patient ID: Chad [name], male    DOB: 02/08/1961, 63 y.o.   MRN: [id number] Chief Complaint  Patient presents with   Follow-up    1 month follow up     HPI  Review of Systems     Objective:   Physical Exam  BP 109/73 (BP Location: Right Arm, Patient Position: Sitting, Cuff Size: Normal)   Pulse 86   Resp 16   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    GERD (gastroesophageal reflux disease)    Hepatitis    chronic hep c   Hydronephrosis    Hydronephrosis with ureteral stricture    Hyperlipidemia    Insomnia, unspecified    Knee pain    Left   Major depressive disorder, recurrent, moderate (HCC)    Malignant neoplasm of colon (HCC)    Malnutrition (HCC)    Muscle weakness (generalized)    Nerve pain    Other abnormalities of gait and mobility    Other chronic pain    Peripheral vascular disease (HCC)    Personal history of transient cerebral ischemia    Pressure ulcer    Prostate cancer (HCC)    Stroke (HCC)    Tinea unguium    Tobacco user    Unspecified protein-calorie malnutrition (HCC)    Ureteral cancer, right (HCC)    Urinary frequency    Venous hypertension of both lower extremities    Xerosis cutis     Social History   Socioeconomic History   Marital status: Single    Spouse name: Not on file   Number of children: Not on  file   Years of education: Not on file   Highest education level: Not on file  Occupational History   Not on file  Tobacco Use   Smoking status: Every Day    Current packs/day: 0.50    Types: Cigarettes    Passive exposure: Never   Smokeless tobacco: Never  Vaping Use   Vaping status: Never Used  Substance and Sexual Activity   Alcohol  use: Not Currently   Drug use: Not Currently    Types: Marijuana, Methylphenidate    Comment: quit 1997-1998 ish   Sexual activity: Not Currently  Other Topics Concern   Not on file  Social History Narrative    used to live [place]; moved  To [place]- end of April 2019; in Nursing home; 1pp/day; quit alcohol . 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        Family- dad- prostate ca [at 78y]; brother- 78 died of prostate cancer; brother- 41- no cancers [[place]]; sonLarinda [name] [[place]];[id number] prostate ca [[place] ]; daughter- 35 [[place]]; another daughter 42 [[place]/addict]. will refer genetics counseling. Given MSI- abnormal; highly suspicious of Lynch  syndrome.  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   Social Drivers of Corporate investment banker Strain: Not on file  Food Insecurity: No Food Insecurity (10/13/2023)   Hunger Vital Sign    Worried About Running Out of Food in the Last Year: Never true    Ran Out of Food in the Last Year: Never true  Transportation Needs: No Transportation Needs (10/13/2023)   PRAPARE - Administrator, Civil Service (Medical): No    Lack of Transportation (Non-Medical): No  Physical Activity: Not on file  Stress: Not on file  Social Connections: Moderately Integrated (10/10/2023)   Social Connection and Isolation Panel    Frequency of Communication with Friends and Family: Twice a week    Frequency of Social Gatherings with Friends and Family: Twice a week    Attends Religious Services: 1 to 4 times per year    Active Member of Clubs or Organizations: No    Attends Banker Meetings: 1  to 4 times per year    Marital Status: Never married  Intimate Partner Violence: Not At Risk (10/13/2023)   Humiliation, Afraid, Rape, and Kick questionnaire    Fear of Current or Ex-Partner: No    Emotionally Abused: No    Physically Abused: No    Sexually Abused: No    Past Surgical History:  Procedure Laterality Date   AMPUTATION Left 03/03/2023   Procedure: AMPUTATION BELOW KNEE;  Surgeon: Jackquelyn Mass, MD;  Location: [facility];  Service: Vascular;  Laterality: Left;   AMPUTATION Right 09/30/2023   Procedure: AMPUTATION OF AMPUTATION, BELOW KNEE;  Surgeon: Jackquelyn Mass, MD;  Location: [facility];  Service: Vascular;  Laterality: Right;   APPLICATION OF WOUND VAC Right 10/13/2023   Procedure: APPLICATION, WOUND VAC;  Surgeon: Jackquelyn Mass, MD;  Location: [facility];  Service: Vascular;  Laterality: Right;   COLON SURGERY     En bloc extended right hemicolectomy 07/2017   COLONOSCOPY WITH PROPOFOL  N/A 11/06/2020   Procedure: COLONOSCOPY WITH PROPOFOL ;  Surgeon: Luke Salaam, MD;  Location: [facility];  Service: Gastroenterology;  Laterality: N/A;   COLONOSCOPY WITH PROPOFOL  N/A 07/31/2021   Procedure: COLONOSCOPY WITH PROPOFOL ;  Surgeon: Marshall Skeeter, MD;  Location: [facility];  Service: Endoscopy;  Laterality: N/A;   CYSTOSCOPY W/ RETROGRADES Right 08/30/2018   Procedure: CYSTOSCOPY WITH RETROGRADE PYELOGRAM;  Surgeon: Dustin Gimenez, MD;  Location: [facility];  Service: Urology;  Laterality: Right;   CYSTOSCOPY WITH STENT PLACEMENT Right 04/25/2018   Procedure: CYSTOSCOPY WITH STENT PLACEMENT;  Surgeon: Dustin Gimenez, MD;  Location: [facility];  Service: Urology;  Laterality: Right;   CYSTOSCOPY WITH STENT PLACEMENT Right 08/30/2018   Procedure: CYSTOSCOPY WITH STENT Exchange;  Surgeon: Dustin Gimenez, MD;  Location: [facility];  Service: Urology;  Laterality: Right;   CYSTOSCOPY WITH STENT PLACEMENT Right 03/07/2019   Procedure: CYSTOSCOPY WITH STENT Exchange;   Surgeon: Dustin Gimenez, MD;  Location: [facility];  Service: Urology;  Laterality: Right;   CYSTOSCOPY WITH STENT PLACEMENT Right 11/21/2019   Procedure: CYSTOSCOPY WITH STENT Exchange;  Surgeon: Dustin Gimenez, MD;  Location: [facility];  Service: Urology;  Laterality: Right;   INCISION AND DRAINAGE OF DEEP ABSCESS, KNEE Right 10/11/2023   Procedure: INCISION AND DRAINAGE OF DEEP ABSCESS, KNEE;  Surgeon: Jerri Morale, MD;  Location: [facility];  Service: Vascular;  Laterality: Right;   INCISION AND DRAINAGE OF WOUND Right 10/10/2023   Procedure: IRRIGATION AND DEBRIDEMENT  WOUND RIGHT BKA;  Surgeon: Jerri Morale, MD;  Location: [facility];  Service: Vascular;  Laterality: Right;   INCISION AND DRAINAGE OF WOUND Right 10/13/2023   Procedure: IRRIGATION AND DEBRIDEMENT WOUND;  Surgeon: Jackquelyn Mass, MD;  Location: [facility];  Service: Vascular;  Laterality: Right;   LOWER EXTREMITY ANGIOGRAPHY Left 05/23/2019   Procedure: LOWER EXTREMITY ANGIOGRAPHY;  Surgeon: Celso College, MD;  Location: [facility];  Service: Cardiovascular;  Laterality: Left;   LOWER EXTREMITY ANGIOGRAPHY Right 05/30/2019   Procedure: LOWER EXTREMITY ANGIOGRAPHY;  Surgeon: Celso College, MD;  Location: [facility];  Service: Cardiovascular;  Laterality: Right;   LOWER EXTREMITY ANGIOGRAPHY Right 02/13/2020   Procedure: LOWER EXTREMITY ANGIOGRAPHY;  Surgeon: Celso College, MD;  Location: [facility];  Service: Cardiovascular;  Laterality: Right;   LOWER EXTREMITY ANGIOGRAPHY Left 02/20/2020   Procedure: LOWER EXTREMITY ANGIOGRAPHY;  Surgeon: Celso College, MD;  Location: [facility];  Service: Cardiovascular;  Laterality: Left;   LOWER EXTREMITY ANGIOGRAPHY Left 01/01/2023   Procedure: Lower Extremity Angiography;  Surgeon: Celso College, MD;  Location: [facility];  Service: Cardiovascular;  Laterality: Left;   PORTA CATH INSERTION N/A 02/28/2019   Procedure: PORTA CATH INSERTION;  Surgeon:  Celso College, MD;  Location: [facility];  Service: Cardiovascular;  Laterality: N/A;   ROBOT ASSISTED LAPAROSCOPIC PARTIAL COLECTOMY  11/17/2022   tumor removed       Family History  Problem Relation Age of Onset   Prostate cancer Father 40   Cancer Brother 3       unsure type   Cancer Paternal Uncle        unsure type   Cancer Maternal Grandmother        unsure type   Cancer Paternal Grandmother        unsure type   Kidney cancer Paternal Grandfather    Cancer Other        unsure types   Leukemia Son    Cancer Son        other cancers, possibly colon    Allergies  Allergen Reactions   Penicillins Rash    Tolerated piperacillin /tazobactam in Aug 2024       Latest Ref Rng & Units 11/24/2023    2:12 PM 10/16/2023    6:20 AM 10/15/2023    5:53 AM  CBC  WBC 4.0 - 10.5 K/uL 8.5  12.4  12.8   Hemoglobin 13.0 - 17.0 g/dL 16.1  8.8  8.5   Hematocrit 39.0 - 52.0 % 36.7  27.4  26.5   Platelets 150 - 400 K/uL 267  351  348       CMP     Component Value Date/Time   NA 134 (L) 11/24/2023 1412   K 4.5 11/24/2023 1412   CL 110 11/24/2023 1412   CO2 17 (L) 11/24/2023 1412   GLUCOSE 111 (H) 11/24/2023 1412   BUN 22 11/24/2023 1412   CREATININE 0.77 11/24/2023 1412   CREATININE 1.07 07/01/2023 1359   CALCIUM  8.3 (L) 11/24/2023 1412   PROT 6.9 11/24/2023 1412   ALBUMIN 2.8 (L) 11/24/2023 1412   AST 23 11/24/2023 1412   ALT 13 11/24/2023 1412   ALT 10 07/01/2023 1359   ALKPHOS 136 (H) 11/24/2023 1412   BILITOT 0.2 11/24/2023 1412   EGFR 275.3 11/09/2023 0000   GFRNONAA >60 11/24/2023 1412     No results found.  Assessment & Plan:   1. Hx of left BKA (HCC) (Primary) ***  2. Delayed surgical wound healing of below-the-knee amputation stump (HCC) ***   Current Outpatient Medications on File Prior to Visit  Medication Sig Dispense Refill   acetaminophen  (TYLENOL ) 325 MG tablet Take 650 mg by mouth 3 (three) times daily.      Amino Acids-Protein  Hydrolys (PRO-STAT) LIQD Take 45 mLs by mouth daily.     ascorbic acid  (VITAMIN C ) 500 MG tablet Take 1 tablet (500 mg total) by mouth 2 (two) times daily.     aspirin  EC 81 MG tablet Take 1 tablet (81 mg total) by mouth daily. 150 tablet 2   atorvastatin  (LIPITOR) 10 MG tablet Take 1 tablet (10 mg total) by mouth daily. 30 tablet 11   Calcium  Carbonate-Vitamin D  (CALCIUM  600 +D HIGH POTENCY) 600-10 MG-MCG TABS Take 2 tablets by mouth daily.     carboxymethylcellulose 1 % ophthalmic solution Place 1 drop into the left eye at bedtime.     clopidogrel  (PLAVIX ) 75 MG tablet Take 75 mg by mouth daily.     cyanocobalamin  1000 MCG tablet Take 1 tablet (1,000 mcg total) by mouth daily. 30 tablet 0   cyclobenzaprine  (FEXMID ) 7.5 MG tablet Take 15 mg by mouth at bedtime.     escitalopram  (LEXAPRO ) 5 MG tablet Take 5 mg by mouth daily.     ferrous fumarate  (FERRETTS) 325 (106 Fe) MG TABS tablet Take 1 tablet by mouth daily.     gabapentin  (NEURONTIN ) 300 MG capsule Take 300 mg by mouth 2 (two) times daily.     Magnesium  Chloride 64 MG TABS Take 64 mg by mouth 2 (two) times daily. 60 tablet 0   mirtazapine  (REMERON ) 7.5 MG tablet Take 7.5 mg by mouth at bedtime.     Multiple Vitamin (MULTIVITAMIN) tablet Take 1 tablet by mouth daily.     Oxycodone  HCl 10 MG TABS Take 1 tablet (10 mg total) by mouth every 4 (four) hours as needed. 12 tablet 0   oxymetazoline  (AFRIN) 0.05 % nasal spray Place 4 sprays into both nostrils every 6 (six) hours as needed (nose bleeds).     Polyethyl Glycol-Propyl Glycol (SYSTANE) 0.4-0.3 % SOLN Place 1 drop into both eyes daily.     senna-docusate (SENOKOT-S) 8.6-50 MG tablet Take 1 tablet by mouth daily.     Sofosbuvir -Velpatasvir  (EPCLUSA ) 400-100 MG TABS Take 1 tablet by mouth daily. 28 tablet 2   tamsulosin  (FLOMAX ) 0.4 MG CAPS capsule Take 1 capsule (0.4 mg total) by mouth daily after supper. 30 capsule 3   zinc oxide 20 % ointment Apply 1 Application topically in the morning  and at bedtime. Apply to sacrum     Current Facility-Administered Medications on File Prior to Visit  Medication Dose Route Frequency Provider Last Rate Last Admin   heparin  lock flush 100 UNIT/ML injection             There are no Patient Instructions on file for this visit. No follow-ups on file.   Annamaria Barrette, NP

## 2024-02-12 ENCOUNTER — Ambulatory Visit (INDEPENDENT_AMBULATORY_CARE_PROVIDER_SITE_OTHER): Admitting: Vascular Surgery

## 2024-02-15 ENCOUNTER — Ambulatory Visit: Admission: RE | Admit: 2024-02-15 | Source: Ambulatory Visit

## 2024-03-01 ENCOUNTER — Inpatient Hospital Stay

## 2024-03-01 ENCOUNTER — Inpatient Hospital Stay: Attending: Internal Medicine

## 2024-03-01 ENCOUNTER — Inpatient Hospital Stay: Admitting: Internal Medicine

## 2024-03-01 ENCOUNTER — Encounter: Payer: Self-pay | Admitting: Internal Medicine

## 2024-03-01 VITALS — BP 138/83 | HR 87

## 2024-03-01 VITALS — BP 143/85 | HR 88 | Temp 95.8°F | Resp 16 | Ht 69.0 in

## 2024-03-01 DIAGNOSIS — E43 Unspecified severe protein-calorie malnutrition: Secondary | ICD-10-CM

## 2024-03-01 DIAGNOSIS — R531 Weakness: Secondary | ICD-10-CM | POA: Diagnosis not present

## 2024-03-01 DIAGNOSIS — C661 Malignant neoplasm of right ureter: Secondary | ICD-10-CM | POA: Diagnosis not present

## 2024-03-01 DIAGNOSIS — I739 Peripheral vascular disease, unspecified: Secondary | ICD-10-CM | POA: Diagnosis not present

## 2024-03-01 DIAGNOSIS — N1339 Other hydronephrosis: Secondary | ICD-10-CM | POA: Diagnosis not present

## 2024-03-01 DIAGNOSIS — C187 Malignant neoplasm of sigmoid colon: Secondary | ICD-10-CM | POA: Insufficient documentation

## 2024-03-01 DIAGNOSIS — D500 Iron deficiency anemia secondary to blood loss (chronic): Secondary | ICD-10-CM | POA: Diagnosis not present

## 2024-03-01 DIAGNOSIS — F17210 Nicotine dependence, cigarettes, uncomplicated: Secondary | ICD-10-CM | POA: Diagnosis not present

## 2024-03-01 DIAGNOSIS — E559 Vitamin D deficiency, unspecified: Secondary | ICD-10-CM

## 2024-03-01 DIAGNOSIS — D509 Iron deficiency anemia, unspecified: Secondary | ICD-10-CM | POA: Insufficient documentation

## 2024-03-01 LAB — IRON AND TIBC
Iron: 18 ug/dL — ABNORMAL LOW (ref 45–182)
Saturation Ratios: 5 % — ABNORMAL LOW (ref 17.9–39.5)
TIBC: 339 ug/dL (ref 250–450)
UIBC: 321 ug/dL

## 2024-03-01 LAB — CBC WITH DIFFERENTIAL (CANCER CENTER ONLY)
Abs Immature Granulocytes: 0.03 K/uL (ref 0.00–0.07)
Basophils Absolute: 0.1 K/uL (ref 0.0–0.1)
Basophils Relative: 1 %
Eosinophils Absolute: 0.4 K/uL (ref 0.0–0.5)
Eosinophils Relative: 5 %
HCT: 26.8 % — ABNORMAL LOW (ref 39.0–52.0)
Hemoglobin: 8.3 g/dL — ABNORMAL LOW (ref 13.0–17.0)
Immature Granulocytes: 0 %
Lymphocytes Relative: 12 %
Lymphs Abs: 1 K/uL (ref 0.7–4.0)
MCH: 25.1 pg — ABNORMAL LOW (ref 26.0–34.0)
MCHC: 31 g/dL (ref 30.0–36.0)
MCV: 81 fL (ref 80.0–100.0)
Monocytes Absolute: 0.6 K/uL (ref 0.1–1.0)
Monocytes Relative: 7 %
Neutro Abs: 6.2 K/uL (ref 1.7–7.7)
Neutrophils Relative %: 75 %
Platelet Count: 197 K/uL (ref 150–400)
RBC: 3.31 MIL/uL — ABNORMAL LOW (ref 4.22–5.81)
RDW: 18.4 % — ABNORMAL HIGH (ref 11.5–15.5)
WBC Count: 8.2 K/uL (ref 4.0–10.5)
nRBC: 0 % (ref 0.0–0.2)

## 2024-03-01 LAB — CMP (CANCER CENTER ONLY)
ALT: 20 U/L (ref 0–44)
AST: 29 U/L (ref 15–41)
Albumin: 2.8 g/dL — ABNORMAL LOW (ref 3.5–5.0)
Alkaline Phosphatase: 159 U/L — ABNORMAL HIGH (ref 38–126)
Anion gap: 5 (ref 5–15)
BUN: 14 mg/dL (ref 8–23)
CO2: 18 mmol/L — ABNORMAL LOW (ref 22–32)
Calcium: 8.4 mg/dL — ABNORMAL LOW (ref 8.9–10.3)
Chloride: 111 mmol/L (ref 98–111)
Creatinine: 0.74 mg/dL (ref 0.61–1.24)
GFR, Estimated: >60 mL/min (ref >60)
Glucose, Bld: 105 mg/dL — ABNORMAL HIGH (ref 70–99)
Potassium: 4.7 mmol/L (ref 3.5–5.1)
Sodium: 134 mmol/L — ABNORMAL LOW (ref 135–145)
Total Bilirubin: 0.5 mg/dL (ref 0.0–1.2)
Total Protein: 7 g/dL (ref 6.5–8.1)

## 2024-03-01 LAB — VITAMIN D 25 HYDROXY (VIT D DEFICIENCY, FRACTURES): Vit D, 25-Hydroxy: 38.54 ng/mL (ref 30–100)

## 2024-03-01 LAB — FERRITIN: Ferritin: 30 ng/mL (ref 24–336)

## 2024-03-01 MED ORDER — IRON SUCROSE 20 MG/ML IV SOLN
200.0000 mg | Freq: Once | INTRAVENOUS | Status: AC
  Administered 2024-03-01: 200 mg via INTRAVENOUS
  Filled 2024-03-01: qty 10

## 2024-03-01 NOTE — Progress Notes (Signed)
 No concerns today

## 2024-03-01 NOTE — Assessment & Plan Note (Addendum)
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 Most recently on keytruda ; currently on HOLD. JAN 3rd, 2025- Extensive surgical changes along the bowel once again identified with diverting colostomy and rectosigmoid stump; Severe atrophy of the right kidney with patulous collecting system and indwelling ureteral stent. Persistent asymmetric wall thickening along the urinary bladder posteriorly on the right side. Right renal and bladder calculi; Few scattered prominent lymph nodes seen once again in the chest, abdomen and pelvis. The majority are similar to previous. There is 1 node in left pelvic sidewall near the margin of the rectosigmoid stump which is slightly larger today at 10 mm.  # CONTINUE TO HOLD keytruda  for now [given acute issues].  Continue surveillance at this time. Patient declines any further CT scans for now.   # # Anemia-  multifactorial anemia of chronic disease/IDA-s/p venofer - no blood in stool or urine- worse today hb 8.3- venofer  today.   # PVD - s/p BIL BKA-  [would care]-  S/P LLE- BKA- on Aug 2024 [Dr.Schneir]-  MARCH 2025- amputation of Right LE-  [ vascular/ wound care-]- stable.    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 - ELEVEN LYMPH NODES, NEGATIVE FOR MALIGNANCY (0/11). - SURGICAL MARGINS FREE OF DYSPLASIA AND MALIGNANCY. S/p staple removal.--  Patient declines any further CT scans for now.   # Hepatitis C- [ID] on treatment.   # Hypocalcemia- Mild- on Ca+vit D; G+FEB 2024- 42. Stable  # active smoker:  Counseled against smoking; patient-not interested in quitting.   # IV access: port access   # DISPOSITION: # proceed with  venofer ;  # follow up in 3  months- MD- port/labs/port flush--cbc/cmp;iron  studies; ferritin; Vit  D 25-OH;  possible venofer  Dr.B.

## 2024-03-01 NOTE — Progress Notes (Signed)
 [facility] CONSULT NOTE  Patient Care Team: Pcp, No as PCP - General Rennie Cindy SAUNDERS, MD as Consulting Physician (Internal Medicine) Dessa, Reyes ORN, MD as Consulting Physician (General Surgery)  CHIEF COMPLAINTS/PURPOSE OF CONSULTATION: Urothelial cancer  #  Oncology History Overview Note  # SEP-OCT 2019-right renal pelvis/ ureteral [cytology positive HIGH grade urothelial carcinoma [Dr.Brandon]    # NOV 24th 2019-Keytruda  [consent]  # April 2022- colonoscopy [Dr.Anna;incidental PET- sigmoid uptake] # Sigmoid colon cancer- [history of Lynch syndrome]- [Dr.Cintron; APRIL 2024]- s/p hemicolectomy- permanent ostomy; MUCINOUS ADENOCARCINOMA, 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  - ELEVEN LYMPH NODES, NEGATIVE FOR MALIGNANCY (0/11).  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  Is alone/in a wheelchair.  Chad [name] 63 y.o.  male with Lynch syndrome and above 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; recurrent sigmoid colon cancer  s/p colostomy and multiple other comorbidities including PVD- s/p Bil BKA active smoker is here for  follow-up.   Keytruda  is currently on HOLD given acute issues/PVD etc.   Wounds are healing well.  Denies any blood in stools or black or stools.  Appetite is good otherwise. Ostomy draining normally.  No hematuria.denies any blood in stools. No abd pain. Voiding normal   Review of Systems  Constitutional:  Positive for malaise/fatigue. Negative for chills, diaphoresis, fever and weight loss.  HENT:  Negative for nosebleeds and sore throat.   Eyes:  Negative for double vision.  Respiratory:  Positive for cough and shortness of breath. Negative for hemoptysis and wheezing.   Cardiovascular:  Negative for chest pain, palpitations and orthopnea.  Gastrointestinal:  Positive for constipation. Negative for abdominal pain, blood in stool, diarrhea, heartburn, melena, nausea and vomiting.  Genitourinary:  Negative for dysuria, frequency and urgency.  Musculoskeletal:  Positive for back pain and joint pain.  Skin: Negative.  Negative for itching and rash.  Neurological:  Positive for focal weakness. Negative for dizziness, tingling, weakness and headaches.       Chronic left-sided weakness upper than lower extremity.  Endo/Heme/Allergies:  Does not bruise/bleed easily.  Psychiatric/Behavioral:  Negative for depression. The patient is not nervous/anxious and does not have insomnia.      MEDICAL HISTORY:  Past Medical History:  Diagnosis Date   Anemia    Anxiety    ARF (acute respiratory failure) (HCC)    Atherosclerosis of arteries of extremities (HCC)    Benign prostatic hyperplasia with urinary obstruction    Bladder cancer (HCC)    BPH with obstruction/lower urinary tract symptoms    Chronic viral hepatitis C (HCC)    Colon cancer (HCC)  COPD (chronic obstructive pulmonary disease) (HCC)    Depression    Dysphagia    Family history of colon cancer    Family history of kidney cancer    Family history of leukemia    Family history of prostate cancer    Generalized anxiety disorder     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    GERD (gastroesophageal reflux disease)    Hepatitis    chronic hep c   Hydronephrosis    Hydronephrosis with ureteral stricture    Hyperlipidemia    Insomnia, unspecified    Knee pain    Left   Major depressive disorder, recurrent, moderate (HCC)    Malignant neoplasm of colon (HCC)    Malnutrition (HCC)    Muscle weakness (generalized)    Nerve pain    Other abnormalities of gait and mobility    Other chronic pain    Peripheral vascular disease (HCC)    Personal history of transient cerebral ischemia    Pressure ulcer    Prostate cancer (HCC)    Stroke (HCC)    Tinea unguium    Tobacco user    Unspecified protein-calorie malnutrition (HCC)    Ureteral cancer, right (HCC)    Urinary frequency    Venous hypertension of both lower extremities    Xerosis cutis     SURGICAL HISTORY: Past Surgical History:  Procedure Laterality Date   AMPUTATION Left 03/03/2023   Procedure: AMPUTATION BELOW KNEE;  Surgeon: Jama Cordella MATSU, MD;  Location: [facility];  Service: Vascular;  Laterality: Left;   AMPUTATION Right 09/30/2023   Procedure: AMPUTATION OF AMPUTATION, BELOW KNEE;  Surgeon: Jama Cordella MATSU, MD;  Location: [facility];  Service: Vascular;  Laterality: Right;   APPLICATION OF WOUND VAC Right 10/13/2023   Procedure: APPLICATION, WOUND VAC;  Surgeon: Jama Cordella MATSU, MD;  Location: [facility];  Service: Vascular;  Laterality: Right;   COLON SURGERY     En bloc extended right hemicolectomy 07/2017   COLONOSCOPY WITH PROPOFOL  N/A 11/06/2020   Procedure: COLONOSCOPY WITH PROPOFOL ;  Surgeon: Therisa Bi, MD;  Location: [facility];  Service: Gastroenterology;  Laterality: N/A;   COLONOSCOPY WITH PROPOFOL  N/A 07/31/2021   Procedure: COLONOSCOPY WITH PROPOFOL ;  Surgeon: Dessa Reyes ORN, MD;  Location: [facility];  Service: Endoscopy;  Laterality: N/A;   CYSTOSCOPY W/ RETROGRADES Right 08/30/2018   Procedure: CYSTOSCOPY  WITH RETROGRADE PYELOGRAM;  Surgeon: Penne Knee, MD;  Location: [facility];  Service: Urology;  Laterality: Right;   CYSTOSCOPY WITH STENT PLACEMENT Right 04/25/2018   Procedure: CYSTOSCOPY WITH STENT PLACEMENT;  Surgeon: Penne Knee, MD;  Location: [facility];  Service: Urology;  Laterality: Right;   CYSTOSCOPY WITH STENT PLACEMENT Right 08/30/2018   Procedure: CYSTOSCOPY WITH STENT Exchange;  Surgeon: Penne Knee, MD;  Location: [facility];  Service: Urology;  Laterality: Right;   CYSTOSCOPY WITH STENT PLACEMENT Right 03/07/2019   Procedure: CYSTOSCOPY WITH STENT Exchange;  Surgeon: Penne Knee, MD;  Location: [facility];  Service: Urology;  Laterality: Right;   CYSTOSCOPY WITH STENT PLACEMENT Right 11/21/2019   Procedure: CYSTOSCOPY WITH STENT Exchange;  Surgeon: Penne Knee, MD;  Location: [facility];  Service: Urology;  Laterality: Right;   INCISION AND DRAINAGE OF DEEP ABSCESS, KNEE Right 10/11/2023   Procedure: INCISION AND DRAINAGE OF DEEP ABSCESS, KNEE;  Surgeon: Elnor Norleen CROME, MD;  Location: [facility];  Service: Vascular;  Laterality: Right;   INCISION AND DRAINAGE OF WOUND Right 10/10/2023   Procedure: IRRIGATION AND DEBRIDEMENT  WOUND RIGHT BKA;  Surgeon: Elnor Norleen CROME, MD;  Location: [facility];  Service: Vascular;  Laterality: Right;   INCISION AND DRAINAGE OF WOUND Right 10/13/2023   Procedure: IRRIGATION AND DEBRIDEMENT WOUND;  Surgeon: Jama Cordella MATSU, MD;  Location: [facility];  Service: Vascular;  Laterality: Right;   LOWER EXTREMITY ANGIOGRAPHY Left 05/23/2019   Procedure: LOWER EXTREMITY ANGIOGRAPHY;  Surgeon: Marea Selinda RAMAN, MD;  Location: [facility];  Service: Cardiovascular;  Laterality: Left;   LOWER EXTREMITY ANGIOGRAPHY Right 05/30/2019   Procedure: LOWER EXTREMITY ANGIOGRAPHY;  Surgeon: Marea Selinda RAMAN, MD;  Location: [facility];  Service: Cardiovascular;  Laterality: Right;   LOWER EXTREMITY ANGIOGRAPHY Right 02/13/2020   Procedure: LOWER EXTREMITY  ANGIOGRAPHY;  Surgeon: Marea Selinda RAMAN, MD;  Location: [facility];  Service: Cardiovascular;  Laterality: Right;   LOWER EXTREMITY ANGIOGRAPHY Left 02/20/2020   Procedure: LOWER EXTREMITY ANGIOGRAPHY;  Surgeon: Marea Selinda RAMAN, MD;  Location: [facility];  Service: Cardiovascular;  Laterality: Left;   LOWER EXTREMITY ANGIOGRAPHY Left 01/01/2023   Procedure: Lower Extremity Angiography;  Surgeon: Marea Selinda RAMAN, MD;  Location: [facility];  Service: Cardiovascular;  Laterality: Left;   PORTA CATH INSERTION N/A 02/28/2019   Procedure: PORTA CATH INSERTION;  Surgeon: Marea Selinda RAMAN, MD;  Location: [facility];  Service: Cardiovascular;  Laterality: N/A;   ROBOT ASSISTED LAPAROSCOPIC PARTIAL COLECTOMY  11/17/2022   tumor removed       SOCIAL HISTORY: Social History   Socioeconomic History   Marital status: Single    Spouse name: Not on file   Number of children: Not on file   Years of education: Not on file   Highest education level: Not on file  Occupational History   Not on file  Tobacco Use   Smoking status: Every Day    Current packs/day: 0.50    Types: Cigarettes    Passive exposure: Never   Smokeless tobacco: Never  Vaping Use   Vaping status: Never Used  Substance and Sexual Activity   Alcohol  use: Not Currently   Drug use: Not Currently    Types: Marijuana, Methylphenidate    Comment: quit 1997-1998 ish   Sexual activity: Not Currently  Other Topics Concern   Not on file  Social History Narrative    used to live [place]; moved  To [place]- end of April 2019; in Nursing home; 1pp/day; quit alcohol . 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        Family- dad- prostate ca [at 78y]; brother- 55 died of prostate cancer; brother- 66- no cancers [[place]]; sonGLENWOOD Barefoot [];[id number] prostate ca [[place] ]; daughter- 35 [[place]]; another daughter 64 [[place]/addict]. will refer genetics counseling. Given MSI- abnormal; highly suspicious of Lynch syndrome.  Patient's son  Lonni aware of high possible lynch syndrome.   Social Drivers of Corporate investment banker Strain: Not on file  Food Insecurity: No Food Insecurity (10/13/2023)   Hunger Vital Sign    Worried About Running Out of Food in the Last Year: Never true    Ran Out of Food in the Last Year: Never true  Transportation Needs: No Transportation Needs (10/13/2023)   PRAPARE - Administrator, Civil Service (Medical): No    Lack of Transportation (Non-Medical): No  Physical Activity: Not on file  Stress: Not on file  Social Connections: Moderately Integrated (10/10/2023)   Social Connection and Isolation Panel    Frequency of  Communication with Friends and Family: Twice a week    Frequency of Social Gatherings with Friends and Family: Twice a week    Attends Religious Services: 1 to 4 times per year    Active Member of [employer] or Organizations: No    Attends Engineer, structural: 1 to 4 times per year    Marital Status: Never married  Intimate Partner Violence: Not At Risk (10/13/2023)   Humiliation, Afraid, Rape, and Kick questionnaire    Fear of Current or Ex-Partner: No    Emotionally Abused: No    Physically Abused: No    Sexually Abused: No    FAMILY HISTORY: Family History  Problem Relation Age of Onset   Prostate cancer Father 17   Cancer Brother 90       unsure type   Cancer Paternal Uncle        unsure type   Cancer Maternal Grandmother        unsure type   Cancer Paternal Grandmother        unsure type   Kidney cancer Paternal Grandfather    Cancer Other        unsure types   Leukemia Son    Cancer Son        other cancers, possibly colon    ALLERGIES:  is allergic to penicillins.  MEDICATIONS:  Current Outpatient Medications  Medication Sig Dispense Refill   acetaminophen  (TYLENOL ) 325 MG tablet Take 650 mg by mouth 3 (three) times daily.      ascorbic acid  (VITAMIN C ) 500 MG tablet Take 1 tablet (500 mg total) by mouth 2 (two) times daily.      aspirin  EC 81 MG tablet Take 1 tablet (81 mg total) by mouth daily. 150 tablet 2   atorvastatin  (LIPITOR) 10 MG tablet Take 1 tablet (10 mg total) by mouth daily. 30 tablet 11   carboxymethylcellulose 1 % ophthalmic solution Place 1 drop into the left eye at bedtime.     clopidogrel  (PLAVIX ) 75 MG tablet Take 75 mg by mouth daily.     cyclobenzaprine  (FEXMID ) 7.5 MG tablet Take 15 mg by mouth at bedtime.     ferrous fumarate  (FERRETTS) 325 (106 Fe) MG TABS tablet Take 1 tablet by mouth daily.     gabapentin  (NEURONTIN ) 300 MG capsule Take 300 mg by mouth 2 (two) times daily.     Magnesium  Chloride 64 MG TABS Take 64 mg by mouth 2 (two) times daily. 60 tablet 0   mirtazapine  (REMERON ) 7.5 MG tablet Take 7.5 mg by mouth at bedtime.     morphine  (MS CONTIN ) 15 MG 12 hr tablet Take 15 mg by mouth every 4 (four) hours.     Multiple Vitamin (MULTIVITAMIN) tablet Take 1 tablet by mouth daily.     Oxycodone  HCl 10 MG TABS Take 1 tablet (10 mg total) by mouth every 4 (four) hours as needed. 12 tablet 0   oxymetazoline  (AFRIN) 0.05 % nasal spray Place 4 sprays into both nostrils every 6 (six) hours as needed (nose bleeds).     Polyethyl Glycol-Propyl Glycol (SYSTANE) 0.4-0.3 % SOLN Place 1 drop into both eyes daily.     senna-docusate (SENOKOT-S) 8.6-50 MG tablet Take 1 tablet by mouth daily.     Sofosbuvir -Velpatasvir  (EPCLUSA ) 400-100 MG TABS Take 1 tablet by mouth daily. 28 tablet 2   No current facility-administered medications for this visit.   Facility-Administered Medications Ordered in Other Visits  Medication Dose Route Frequency Provider Last  Rate Last Admin   heparin  lock flush 100 UNIT/ML injection            iron  sucrose (VENOFER ) injection 200 mg  200 mg Intravenous Once Rayla Pember R, MD       PHYSICAL EXAMINATION: ECOG PERFORMANCE STATUS: 1 - Symptomatic but completely ambulatory  Vitals:   03/01/24 1245 03/01/24 1321  BP: (!) 148/100 (!) 143/85  Pulse: 88   Resp: 16    Temp: (!) 95.8 F (35.4 C)   SpO2: 99%      There were no vitals filed for this visit.  Bilateral BKA.  Frail-appearing male patient.positive for colostomy  Physical Exam HENT:     Head: Normocephalic and atraumatic.     Mouth/Throat:     Pharynx: No oropharyngeal exudate.  Eyes:     Pupils: Pupils are equal, round, and reactive to light.  Cardiovascular:     Rate and Rhythm: Normal rate and regular rhythm.  Pulmonary:     Effort: No respiratory distress.     Breath sounds: No wheezing.  Abdominal:     General: Bowel sounds are normal. There is no distension.     Palpations: Abdomen is soft. There is no mass.     Tenderness: There is no abdominal tenderness. There is no guarding or rebound.  Musculoskeletal:        General: No tenderness. Normal range of motion.     Cervical back: Normal range of motion and neck supple.  Skin:    General: Skin is warm.  Neurological:     Mental Status: He is oriented to person, place, and time.  Psychiatric:        Mood and Affect: Affect normal.      LABORATORY DATA:  I have reviewed the data as listed Lab Results  Component Value Date   WBC 8.2 03/01/2024   HGB 8.3 (L) 03/01/2024   HCT 26.8 (L) 03/01/2024   MCV 81.0 03/01/2024   PLT 197 03/01/2024   Recent Labs    10/09/23 1945 10/10/23 0514 10/16/23 0620 11/24/23 1412 03/01/24 1243  NA 131*   < > 136 134* 134*  K 4.3   < > 4.0 4.5 4.7  CL 105   < > 104 110 111  CO2 18*   < > 25 17* 18*  GLUCOSE 117*   < > 103* 111* 105*  BUN 31*   < > 21 22 14   CREATININE 1.12   < > 1.04 0.77 0.74  CALCIUM  8.7*   < > 9.0 8.3* 8.4*  GFRNONAA >60   < > >60 >60 >60  PROT 7.4  --   --  6.9 7.0  ALBUMIN 2.5*  --   --  2.8* 2.8*  AST 29  --   --  23 29  ALT 25  --   --  13 20  ALKPHOS 145*  --   --  136* 159*  BILITOT 0.6  --   --  0.2 0.5   < > = values in this interval not displayed.    ASSESSMENT & PLAN:   Ureteral cancer, right (HCC) # High-grade urothelial  cancer/cytology; likely of the right renal pelvis /upper ureter. Most recently on keytruda ; currently on HOLD. JAN 3rd, 2025- Extensive surgical changes along the bowel once again identified with diverting colostomy and rectosigmoid stump; Severe atrophy of the right kidney with patulous collecting system and indwelling ureteral stent. Persistent asymmetric wall thickening along the urinary bladder posteriorly on the  right side. Right renal and bladder calculi; Few scattered prominent lymph nodes seen once again in the chest, abdomen and pelvis. The majority are similar to previous. There is 1 node in left pelvic sidewall near the margin of the rectosigmoid stump which is slightly larger today at 10 mm.  # CONTINUE TO HOLD keytruda  for now [given acute issues].  Continue surveillance at this time. Patient declines any further CT scans for now.   # # Anemia-  multifactorial anemia of chronic disease/IDA-s/p venofer - no blood in stool or urine- worse today hb 8.3- venofer  today.   # PVD - s/p BIL BKA-  [would care]-  S/P LLE- BKA- on Aug 2024 [Dr.Schneir]-  MARCH 2025- amputation of Right LE-  [ vascular/ wound care-]- stable.    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 - ELEVEN LYMPH NODES, NEGATIVE FOR MALIGNANCY (0/11). - SURGICAL MARGINS FREE OF DYSPLASIA AND MALIGNANCY. S/p staple removal.--  Patient declines any further CT scans for now.   # Hepatitis C- [ID] on treatment.   # Hypocalcemia- Mild- on Ca+vit D; G+FEB 2024- 42. Stable  # active smoker:  Counseled against smoking; patient-not interested in quitting.   # IV access: port access   # DISPOSITION: # proceed with  venofer ;  # follow up in 3  months- MD- port/labs/port flush--cbc/cmp;iron  studies; ferritin; Vit D 25-OH;  possible  venofer  Dr.B.    All questions were answered. The patient knows to call the clinic with any problems, questions or concerns.    Cindy JONELLE Joe, MD 03/01/2024 2:06 PM

## 2024-05-27 ENCOUNTER — Ambulatory Visit: Payer: Self-pay | Admitting: Vascular Surgery

## 2024-06-02 ENCOUNTER — Inpatient Hospital Stay

## 2024-06-02 ENCOUNTER — Inpatient Hospital Stay (HOSPITAL_BASED_OUTPATIENT_CLINIC_OR_DEPARTMENT_OTHER): Admitting: Internal Medicine

## 2024-06-02 ENCOUNTER — Inpatient Hospital Stay: Attending: Internal Medicine

## 2024-06-02 ENCOUNTER — Encounter: Payer: Self-pay | Admitting: Internal Medicine

## 2024-06-02 VITALS — BP 129/95 | HR 95

## 2024-06-02 VITALS — BP 137/86 | HR 94 | Temp 98.1°F | Resp 16 | Ht 69.0 in

## 2024-06-02 DIAGNOSIS — Z8042 Family history of malignant neoplasm of prostate: Secondary | ICD-10-CM | POA: Diagnosis not present

## 2024-06-02 DIAGNOSIS — D500 Iron deficiency anemia secondary to blood loss (chronic): Secondary | ICD-10-CM

## 2024-06-02 DIAGNOSIS — E559 Vitamin D deficiency, unspecified: Secondary | ICD-10-CM

## 2024-06-02 DIAGNOSIS — Z85038 Personal history of other malignant neoplasm of large intestine: Secondary | ICD-10-CM | POA: Diagnosis not present

## 2024-06-02 DIAGNOSIS — E43 Unspecified severe protein-calorie malnutrition: Secondary | ICD-10-CM

## 2024-06-02 DIAGNOSIS — R531 Weakness: Secondary | ICD-10-CM | POA: Diagnosis not present

## 2024-06-02 DIAGNOSIS — C661 Malignant neoplasm of right ureter: Secondary | ICD-10-CM

## 2024-06-02 DIAGNOSIS — Z806 Family history of leukemia: Secondary | ICD-10-CM | POA: Insufficient documentation

## 2024-06-02 DIAGNOSIS — D509 Iron deficiency anemia, unspecified: Secondary | ICD-10-CM | POA: Insufficient documentation

## 2024-06-02 DIAGNOSIS — B182 Chronic viral hepatitis C: Secondary | ICD-10-CM | POA: Insufficient documentation

## 2024-06-02 DIAGNOSIS — N1339 Other hydronephrosis: Secondary | ICD-10-CM

## 2024-06-02 DIAGNOSIS — Z8051 Family history of malignant neoplasm of kidney: Secondary | ICD-10-CM | POA: Insufficient documentation

## 2024-06-02 LAB — CBC WITH DIFFERENTIAL (CANCER CENTER ONLY)
Abs Immature Granulocytes: 0.15 K/uL — ABNORMAL HIGH (ref 0.00–0.07)
Basophils Absolute: 0.1 K/uL (ref 0.0–0.1)
Basophils Relative: 1 %
Eosinophils Absolute: 0.5 K/uL (ref 0.0–0.5)
Eosinophils Relative: 3 %
HCT: 30.3 % — ABNORMAL LOW (ref 39.0–52.0)
Hemoglobin: 9.3 g/dL — ABNORMAL LOW (ref 13.0–17.0)
Immature Granulocytes: 1 %
Lymphocytes Relative: 9 %
Lymphs Abs: 1.5 K/uL (ref 0.7–4.0)
MCH: 24.5 pg — ABNORMAL LOW (ref 26.0–34.0)
MCHC: 30.7 g/dL (ref 30.0–36.0)
MCV: 79.9 fL — ABNORMAL LOW (ref 80.0–100.0)
Monocytes Absolute: 0.6 K/uL (ref 0.1–1.0)
Monocytes Relative: 4 %
Neutro Abs: 12.6 K/uL — ABNORMAL HIGH (ref 1.7–7.7)
Neutrophils Relative %: 82 %
Platelet Count: 252 K/uL (ref 150–400)
RBC: 3.79 MIL/uL — ABNORMAL LOW (ref 4.22–5.81)
RDW: 17 % — ABNORMAL HIGH (ref 11.5–15.5)
WBC Count: 15.4 K/uL — ABNORMAL HIGH (ref 4.0–10.5)
nRBC: 0 % (ref 0.0–0.2)

## 2024-06-02 LAB — CMP (CANCER CENTER ONLY)
ALT: 15 U/L (ref 0–44)
AST: 27 U/L (ref 15–41)
Albumin: 2.8 g/dL — ABNORMAL LOW (ref 3.5–5.0)
Alkaline Phosphatase: 117 U/L (ref 38–126)
Anion gap: 8 (ref 5–15)
BUN: 15 mg/dL (ref 8–23)
CO2: 21 mmol/L — ABNORMAL LOW (ref 22–32)
Calcium: 8.3 mg/dL — ABNORMAL LOW (ref 8.9–10.3)
Chloride: 105 mmol/L (ref 98–111)
Creatinine: 0.97 mg/dL (ref 0.61–1.24)
GFR, Estimated: >60 mL/min (ref >60)
Glucose, Bld: 132 mg/dL — ABNORMAL HIGH (ref 70–99)
Potassium: 4.3 mmol/L (ref 3.5–5.1)
Sodium: 134 mmol/L — ABNORMAL LOW (ref 135–145)
Total Bilirubin: 0.5 mg/dL (ref 0.0–1.2)
Total Protein: 7.9 g/dL (ref 6.5–8.1)

## 2024-06-02 LAB — IRON AND TIBC
Iron: 31 ug/dL — ABNORMAL LOW (ref 45–182)
Saturation Ratios: 9 % — ABNORMAL LOW (ref 17.9–39.5)
TIBC: 344 ug/dL (ref 250–450)
UIBC: 313 ug/dL

## 2024-06-02 LAB — FERRITIN: Ferritin: 30 ng/mL (ref 24–336)

## 2024-06-02 LAB — VITAMIN D 25 HYDROXY (VIT D DEFICIENCY, FRACTURES): Vit D, 25-Hydroxy: 26.04 ng/mL — ABNORMAL LOW (ref 30–100)

## 2024-06-02 MED ORDER — IRON SUCROSE 20 MG/ML IV SOLN
200.0000 mg | Freq: Once | INTRAVENOUS | Status: AC
  Administered 2024-06-02: 200 mg via INTRAVENOUS
  Filled 2024-06-02: qty 10

## 2024-06-02 NOTE — Assessment & Plan Note (Signed)
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 Most recently on keytruda ; currently on HOLD. JAN 3rd, 2025- Extensive surgical changes along the bowel once again identified with diverting colostomy and rectosigmoid stump; Severe atrophy of the right kidney with patulous collecting system and indwelling ureteral stent. Persistent asymmetric wall thickening along the urinary bladder posteriorly on the right side. Right renal and bladder calculi; Few scattered prominent lymph nodes seen once again in the chest, abdomen and pelvis. The majority are similar to previous. There is 1 node in left pelvic sidewall near the margin of the rectosigmoid stump which is slightly larger today at 10 mm.  # CONTINUE TO HOLD keytruda  for now [given acute issues].  Continue surveillance at this time. Patient declines any further CT scans for now.   # # Anemia-  multifactorial anemia of chronic disease/IDA-s/p venofer - no blood in stool or urine- worse today hb 8.3- venofer  today.   # PVD - s/p BIL BKA-  [would care]-  S/P LLE- BKA- on Aug 2024 [Dr.Schneir]-  MARCH 2025- amputation of Right LE-  [ vascular/ wound care-]- stable.    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 - ELEVEN LYMPH NODES, NEGATIVE FOR MALIGNANCY (0/11). - SURGICAL MARGINS FREE OF DYSPLASIA AND MALIGNANCY. S/p staple removal.--  Patient declines any further CT scans for now.   # Hepatitis C- [ID] on treatment.   # Hypocalcemia- Mild- on Ca+vit D; G+FEB 2024- 42. Stable  # active smoker:  Counseled against smoking; patient-not interested in quitting.   # IV access: port access   # DISPOSITION: # proceed with  venofer ;  # follow up in 3  months- MD- port/labs/port flush--cbc/cmp;iron  studies; ferritin; Vit  D 25-OH;  possible venofer  Dr.B.

## 2024-06-02 NOTE — Progress Notes (Signed)
 No concerns today

## 2024-06-02 NOTE — Patient Instructions (Signed)

## 2024-06-02 NOTE — Progress Notes (Signed)
 [facility] CONSULT NOTE  Patient Care Team: Pcp, No as PCP - General Rennie Cindy SAUNDERS, MD as Consulting Physician (Internal Medicine) Dessa, Reyes ORN, MD as Consulting Physician (General Surgery)  CHIEF COMPLAINTS/PURPOSE OF CONSULTATION: Urothelial cancer  #  Oncology History Overview Note  # SEP-OCT 2019-right renal pelvis/ ureteral [cytology positive HIGH grade urothelial carcinoma [Dr.Brandon]    # NOV 24th 2019-Keytruda  [consent]  # April 2022- colonoscopy [Dr.Anna;incidental PET- sigmoid uptake] # Sigmoid colon cancer- [history of Lynch syndrome]- [Dr.Cintron; APRIL 2024]- s/p hemicolectomy- permanent ostomy; MUCINOUS ADENOCARCINOMA, 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  - ELEVEN LYMPH NODES, NEGATIVE FOR MALIGNANCY (0/11).  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  Is alone/in a wheelchair.  Lamar JONETTA Horde 63 y.o.  male .   Discussed the use of AI scribe software for clinical note transcription with the patient, who gave verbal consent to proceed.  History of Present Illness   JUDSON TSAN is a 63 year old male with Lynch syndrome and above  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; recurrent sigmoid colon cancer  s/p colostomy and multiple other comorbidities including PVD- s/p Bil BKA active smoker  who presents with non-healing wounds after skin grafts recent s/p BIL BKA.   He has undergone twelve weeks of skin grafts, but the wounds have not healed as expected. X-rays were performed to check for any hidden infections, and he completed a six-week course of IV antibiotics administered through a port.  His hemoglobin level has improved from 8.3 to 9.3. He takes iron  pills daily to help with his blood count.  He is satisfied with his mobility aid, stating it allows him to be more independent.     Keytruda  is currently on HOLD given acute issues/PVD etc.   Wounds are healing well.  Review of Systems  Constitutional:  Positive for malaise/fatigue. Negative for chills, diaphoresis, fever and weight loss.  HENT:  Negative for nosebleeds and sore throat.   Eyes:  Negative for double vision.  Respiratory:  Positive for cough and shortness of breath. Negative for hemoptysis and wheezing.   Cardiovascular:  Negative for chest pain, palpitations and orthopnea.  Gastrointestinal:  Positive for constipation. Negative for abdominal pain, blood in stool, diarrhea, heartburn, melena, nausea and vomiting.  Genitourinary:  Negative for dysuria, frequency and urgency.  Musculoskeletal:  Positive for back pain and joint pain.  Skin: Negative.  Negative for itching and rash.  Neurological:  Positive for focal weakness. Negative for dizziness, tingling, weakness and headaches.       Chronic left-sided weakness upper than lower extremity.  Endo/Heme/Allergies:  Does not  bruise/bleed easily.  Psychiatric/Behavioral:  Negative for depression. The patient is not nervous/anxious and does not have insomnia.      MEDICAL HISTORY:  Past Medical History:  Diagnosis Date   Anemia    Anxiety    ARF (acute respiratory failure)  (HCC)    Atherosclerosis of arteries of extremities    Benign prostatic hyperplasia with urinary obstruction    Bladder cancer (HCC)    BPH with obstruction/lower urinary tract symptoms    Chronic viral hepatitis C (HCC)    Colon cancer (HCC)    COPD (chronic obstructive pulmonary disease) (HCC)    Depression    Dysphagia    Family history of colon cancer    Family history of kidney cancer    Family history of leukemia    Family history of prostate cancer    Generalized anxiety disorder    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    GERD (gastroesophageal reflux disease)    Hepatitis    chronic hep c   Hydronephrosis    Hydronephrosis with ureteral stricture    Hyperlipidemia    Insomnia, unspecified    Knee pain    Left   Major depressive disorder, recurrent, moderate (HCC)    Malignant neoplasm of colon (HCC)    Malnutrition    Muscle weakness (generalized)    Nerve pain    Other abnormalities of gait and mobility    Other chronic pain    Peripheral vascular disease    Personal history of transient cerebral ischemia    Pressure ulcer    Prostate cancer (HCC)    Stroke (HCC)    Tinea unguium    Tobacco user    Unspecified protein-calorie malnutrition    Ureteral cancer, right (HCC)    Urinary frequency    Venous hypertension of both lower extremities    Xerosis cutis     SURGICAL HISTORY: Past Surgical History:  Procedure Laterality Date   AMPUTATION Left 03/03/2023   Procedure: AMPUTATION BELOW KNEE;  Surgeon: Jama Cordella MATSU, MD;  Location: [facility];  Service: Vascular;  Laterality: Left;   AMPUTATION Right 09/30/2023   Procedure: AMPUTATION OF AMPUTATION, BELOW KNEE;  Surgeon: Jama Cordella MATSU, MD;  Location: [facility];  Service: Vascular;  Laterality: Right;   APPLICATION OF WOUND VAC Right 10/13/2023   Procedure: APPLICATION, WOUND VAC;  Surgeon: Jama Cordella MATSU, MD;  Location: [facility];  Service: Vascular;  Laterality: Right;   COLON SURGERY      En bloc extended right hemicolectomy 07/2017   COLONOSCOPY WITH PROPOFOL  N/A 11/06/2020   Procedure: COLONOSCOPY WITH PROPOFOL ;  Surgeon: Therisa Bi, MD;  Location: [facility];  Service: Gastroenterology;  Laterality: N/A;   COLONOSCOPY WITH PROPOFOL  N/A 07/31/2021   Procedure: COLONOSCOPY WITH PROPOFOL ;  Surgeon: Dessa Reyes ORN, MD;  Location: [facility] ENDOSCOPY;  Service: Endoscopy;  Laterality: N/A;   CYSTOSCOPY W/ RETROGRADES Right 08/30/2018   Procedure: CYSTOSCOPY WITH RETROGRADE PYELOGRAM;  Surgeon: Penne Knee, MD;  Location: [facility];  Service: Urology;  Laterality: Right;   CYSTOSCOPY WITH STENT PLACEMENT Right 04/25/2018   Procedure: CYSTOSCOPY WITH STENT PLACEMENT;  Surgeon: Penne Knee, MD;  Location: [facility];  Service: Urology;  Laterality: Right;   CYSTOSCOPY WITH STENT PLACEMENT Right 08/30/2018   Procedure: CYSTOSCOPY WITH STENT Exchange;  Surgeon: Penne Knee, MD;  Location: [facility];  Service: Urology;  Laterality: Right;   CYSTOSCOPY WITH STENT PLACEMENT Right 03/07/2019   Procedure: CYSTOSCOPY WITH STENT Exchange;  Surgeon: Penne Knee,  MD;  Location: [facility];  Service: Urology;  Laterality: Right;   CYSTOSCOPY WITH STENT PLACEMENT Right 11/21/2019   Procedure: CYSTOSCOPY WITH STENT Exchange;  Surgeon: Penne Knee, MD;  Location: [facility];  Service: Urology;  Laterality: Right;   INCISION AND DRAINAGE OF DEEP ABSCESS, KNEE Right 10/11/2023   Procedure: INCISION AND DRAINAGE OF DEEP ABSCESS, KNEE;  Surgeon: Elnor Norleen CROME, MD;  Location: [facility];  Service: Vascular;  Laterality: Right;   INCISION AND DRAINAGE OF WOUND Right 10/10/2023   Procedure: IRRIGATION AND DEBRIDEMENT WOUND RIGHT BKA;  Surgeon: Elnor Norleen CROME, MD;  Location: [facility];  Service: Vascular;  Laterality: Right;   INCISION AND DRAINAGE OF WOUND Right 10/13/2023   Procedure: IRRIGATION AND DEBRIDEMENT WOUND;  Surgeon: Jama Cordella MATSU, MD;  Location: [facility];  Service: Vascular;   Laterality: Right;   LOWER EXTREMITY ANGIOGRAPHY Left 05/23/2019   Procedure: LOWER EXTREMITY ANGIOGRAPHY;  Surgeon: Marea Selinda RAMAN, MD;  Location: [facility];  Service: Cardiovascular;  Laterality: Left;   LOWER EXTREMITY ANGIOGRAPHY Right 05/30/2019   Procedure: LOWER EXTREMITY ANGIOGRAPHY;  Surgeon: Marea Selinda RAMAN, MD;  Location: [facility];  Service: Cardiovascular;  Laterality: Right;   LOWER EXTREMITY ANGIOGRAPHY Right 02/13/2020   Procedure: LOWER EXTREMITY ANGIOGRAPHY;  Surgeon: Marea Selinda RAMAN, MD;  Location: [facility];  Service: Cardiovascular;  Laterality: Right;   LOWER EXTREMITY ANGIOGRAPHY Left 02/20/2020   Procedure: LOWER EXTREMITY ANGIOGRAPHY;  Surgeon: Marea Selinda RAMAN, MD;  Location: [facility];  Service: Cardiovascular;  Laterality: Left;   LOWER EXTREMITY ANGIOGRAPHY Left 01/01/2023   Procedure: Lower Extremity Angiography;  Surgeon: Marea Selinda RAMAN, MD;  Location: [facility];  Service: Cardiovascular;  Laterality: Left;   PORTA CATH INSERTION N/A 02/28/2019   Procedure: PORTA CATH INSERTION;  Surgeon: Marea Selinda RAMAN, MD;  Location: [facility];  Service: Cardiovascular;  Laterality: N/A;   ROBOT ASSISTED LAPAROSCOPIC PARTIAL COLECTOMY  11/17/2022   tumor removed       SOCIAL HISTORY: Social History   Socioeconomic History   Marital status: Single    Spouse name: Not on file   Number of children: Not on file   Years of education: Not on file   Highest education level: Not on file  Occupational History   Not on file  Tobacco Use   Smoking status: Every Day    Current packs/day: 0.50    Types: Cigarettes    Passive exposure: Never   Smokeless tobacco: Never  Vaping Use   Vaping status: Never Used  Substance and Sexual Activity   Alcohol  use: Not Currently   Drug use: Not Currently    Types: Marijuana, Methylphenidate    Comment: quit 1997-1998 ish   Sexual activity: Not Currently  Other Topics Concern   Not on  file  Social History Narrative    used to live [place]; moved  To [place]- end of April 2019; in Nursing home; 1pp/day; quit alcohol . 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        Family- dad- prostate ca [at 78y]; brother- 74 died of prostate cancer; brother- 13- no cancers [[place]]; sonGLENWOOD Barefoot [[place]];[id number] prostate ca [[place] ]; daughter- 77 [[place]]; another daughter 81 [[place]/addict]. will refer genetics counseling. Given MSI- abnormal; highly suspicious of Lynch syndrome.  Patient's son Lonni aware of high possible lynch syndrome.   Social Drivers of Corporate Investment Banker Strain: Not on file  Food Insecurity: No  Food Insecurity (10/13/2023)   Hunger Vital Sign    Worried About Running Out of Food in the Last Year: Never true    Ran Out of Food in the Last Year: Never true  Transportation Needs: No Transportation Needs (10/13/2023)   PRAPARE - Administrator, Civil Service (Medical): No    Lack of Transportation (Non-Medical): No  Physical Activity: Not on file  Stress: Not on file  Social Connections: Moderately Integrated (10/10/2023)   Social Connection and Isolation Panel    Frequency of Communication with Friends and Family: Twice a week    Frequency of Social Gatherings with Friends and Family: Twice a week    Attends Religious Services: 1 to 4 times per year    Active Member of [employer] or Organizations: No    Attends Engineer, Structural: 1 to 4 times per year    Marital Status: Never married  Intimate Partner Violence: Not At Risk (10/13/2023)   Humiliation, Afraid, Rape, and Kick questionnaire    Fear of Current or Ex-Partner: No    Emotionally Abused: No    Physically Abused: No    Sexually Abused: No    FAMILY HISTORY: Family History  Problem Relation Age of Onset   Prostate cancer Father 36   Cancer Brother 51       unsure type   Cancer Paternal Uncle        unsure type   Cancer Maternal Grandmother        unsure type   Cancer Paternal  Grandmother        unsure type   Kidney cancer Paternal Grandfather    Cancer Other        unsure types   Leukemia Son    Cancer Son        other cancers, possibly colon    ALLERGIES:  is allergic to penicillins.  MEDICATIONS:  Current Outpatient Medications  Medication Sig Dispense Refill   acetaminophen  (TYLENOL ) 325 MG tablet Take 650 mg by mouth 3 (three) times daily.      ascorbic acid  (VITAMIN C ) 500 MG tablet Take 1 tablet (500 mg total) by mouth 2 (two) times daily.     aspirin  EC 81 MG tablet Take 1 tablet (81 mg total) by mouth daily. 150 tablet 2   carboxymethylcellulose 1 % ophthalmic solution Place 1 drop into the left eye at bedtime.     clopidogrel  (PLAVIX ) 75 MG tablet Take 75 mg by mouth daily.     cyclobenzaprine  (FEXMID ) 7.5 MG tablet Take 15 mg by mouth at bedtime.     ferrous fumarate  (FERRETTS) 325 (106 Fe) MG TABS tablet Take 1 tablet by mouth daily.     gabapentin  (NEURONTIN ) 300 MG capsule Take 300 mg by mouth 2 (two) times daily.     Magnesium  Chloride 64 MG TABS Take 64 mg by mouth 2 (two) times daily. 60 tablet 0   mirtazapine  (REMERON ) 7.5 MG tablet Take 7.5 mg by mouth at bedtime.     morphine  (MS CONTIN ) 15 MG 12 hr tablet Take 15 mg by mouth every 4 (four) hours.     Multiple Vitamin (MULTIVITAMIN) tablet Take 1 tablet by mouth daily.     oxymetazoline  (AFRIN) 0.05 % nasal spray Place 4 sprays into both nostrils every 6 (six) hours as needed (nose bleeds).     Polyethyl Glycol-Propyl Glycol (SYSTANE) 0.4-0.3 % SOLN Place 1 drop into both eyes daily.     atorvastatin  (LIPITOR) 10  MG tablet Take 1 tablet (10 mg total) by mouth daily. 30 tablet 11   Oxycodone  HCl 10 MG TABS Take 1 tablet (10 mg total) by mouth every 4 (four) hours as needed. 12 tablet 0   senna-docusate (SENOKOT-S) 8.6-50 MG tablet Take 1 tablet by mouth daily.     Sofosbuvir -Velpatasvir  (EPCLUSA ) 400-100 MG TABS Take 1 tablet by mouth daily. 28 tablet 2   No current  facility-administered medications for this visit.   Facility-Administered Medications Ordered in Other Visits  Medication Dose Route Frequency Provider Last Rate Last Admin   heparin  lock flush 100 UNIT/ML injection            PHYSICAL EXAMINATION: ECOG PERFORMANCE STATUS: 1 - Symptomatic but completely ambulatory  Vitals:   06/02/24 1008  BP: 137/86  Pulse: 94  Resp: 16  Temp: 98.1 F (36.7 C)  SpO2: 99%     Filed Weights    Bilateral BKA.  Frail-appearing male patient.positive for colostomy  Physical Exam HENT:     Head: Normocephalic and atraumatic.     Mouth/Throat:     Pharynx: No oropharyngeal exudate.  Eyes:     Pupils: Pupils are equal, round, and reactive to light.  Cardiovascular:     Rate and Rhythm: Normal rate and regular rhythm.  Pulmonary:     Effort: No respiratory distress.     Breath sounds: No wheezing.  Abdominal:     General: Bowel sounds are normal. There is no distension.     Palpations: Abdomen is soft. There is no mass.     Tenderness: There is no abdominal tenderness. There is no guarding or rebound.  Musculoskeletal:        General: No tenderness. Normal range of motion.     Cervical back: Normal range of motion and neck supple.  Skin:    General: Skin is warm.  Neurological:     Mental Status: He is oriented to person, place, and time.  Psychiatric:        Mood and Affect: Affect normal.      LABORATORY DATA:  I have reviewed the data as listed Lab Results  Component Value Date   WBC 15.4 (H) 06/02/2024   HGB 9.3 (L) 06/02/2024   HCT 30.3 (L) 06/02/2024   MCV 79.9 (L) 06/02/2024   PLT 252 06/02/2024   Recent Labs    11/24/23 1412 03/01/24 1243 06/02/24 1012  NA 134* 134* 134*  K 4.5 4.7 4.3  CL 110 111 105  CO2 17* 18* 21*  GLUCOSE 111* 105* 132*  BUN 22 14 15   CREATININE 0.77 0.74 0.97  CALCIUM  8.3* 8.4* 8.3*  GFRNONAA >60 >60 >60  PROT 6.9 7.0 7.9  ALBUMIN 2.8* 2.8* 2.8*  AST 23 29 27   ALT 13 20 15    ALKPHOS 136* 159* 117  BILITOT 0.2 0.5 0.5    ASSESSMENT & PLAN:   Ureteral cancer, right (HCC) # High-grade urothelial cancer/cytology; likely of the right renal pelvis /upper ureter. Most recently on keytruda ; currently on HOLD. JAN 3rd, 2025- Extensive surgical changes along the bowel once again identified with diverting colostomy and rectosigmoid stump; Severe atrophy of the right kidney with patulous collecting system and indwelling ureteral stent. Persistent asymmetric wall thickening along the urinary bladder posteriorly on the right side. Right renal and bladder calculi; Few scattered prominent lymph nodes seen once again in the chest, abdomen and pelvis. The majority are similar to previous. There is 1 node in left pelvic sidewall near  the margin of the rectosigmoid stump which is slightly larger today at 10 mm.  # CONTINUE TO HOLD keytruda  for now [given acute issues].  Continue surveillance at this time. Patient declines any further CT scans for now.   # # Anemia-  multifactorial anemia of chronic disease/IDA-s/p venofer - no blood in stool or urine- worse today hb 8.3- venofer  today.   # PVD - s/p BIL BKA-  [would care]-  S/P LLE- BKA- on Aug 2024 [Dr.Schneir]-  MARCH 2025- amputation of Right LE-  [ vascular/ wound care-]- stable.    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 - ELEVEN LYMPH NODES, NEGATIVE FOR MALIGNANCY (0/11). - SURGICAL MARGINS FREE OF DYSPLASIA AND MALIGNANCY. S/p staple removal.--  Patient declines any further CT scans for now.   # Hepatitis C- [ID] on treatment.   # Hypocalcemia- Mild- on Ca+vit D; G+FEB 2024- 42. Stable  # active smoker:  Counseled against smoking; patient-not interested in quitting.   # IV access: port access   # DISPOSITION: # proceed  with  venofer ;  # follow up in 3  months- MD- port/labs/port flush--cbc/cmp;iron  studies; ferritin; Vit D 25-OH;  possible venofer  Dr.B.     All questions were answered. The patient knows to call the clinic with any problems, questions or concerns.    Cindy JONELLE Joe, MD 06/02/2024 12:02 PM

## 2024-06-30 ENCOUNTER — Other Ambulatory Visit: Payer: Self-pay

## 2024-06-30 ENCOUNTER — Emergency Department
Admission: EM | Admit: 2024-06-30 | Discharge: 2024-07-01 | Disposition: A | Discharge status: Skilled Nursing Facility | Attending: Emergency Medicine | Admitting: Emergency Medicine

## 2024-06-30 DIAGNOSIS — Z8554 Personal history of malignant neoplasm of ureter: Secondary | ICD-10-CM | POA: Insufficient documentation

## 2024-06-30 DIAGNOSIS — R040 Epistaxis: Secondary | ICD-10-CM | POA: Insufficient documentation

## 2024-06-30 LAB — CBC
HCT: 27.8 % — ABNORMAL LOW (ref 39.0–52.0)
Hemoglobin: 8.5 g/dL — ABNORMAL LOW (ref 13.0–17.0)
MCH: 24.6 pg — ABNORMAL LOW (ref 26.0–34.0)
MCHC: 30.6 g/dL (ref 30.0–36.0)
MCV: 80.6 fL (ref 80.0–100.0)
Platelets: 247 K/uL (ref 150–400)
RBC: 3.45 MIL/uL — ABNORMAL LOW (ref 4.22–5.81)
RDW: 16.6 % — ABNORMAL HIGH (ref 11.5–15.5)
WBC: 12 K/uL — ABNORMAL HIGH (ref 4.0–10.5)
nRBC: 0 % (ref 0.0–0.2)

## 2024-06-30 LAB — BASIC METABOLIC PANEL WITH GFR
Anion gap: 11 (ref 5–15)
BUN: 18 mg/dL (ref 8–23)
CO2: 19 mmol/L — ABNORMAL LOW (ref 22–32)
Calcium: 8.9 mg/dL (ref 8.9–10.3)
Chloride: 103 mmol/L (ref 98–111)
Creatinine, Ser: 0.68 mg/dL (ref 0.61–1.24)
GFR, Estimated: >60 mL/min (ref >60)
Glucose, Bld: 107 mg/dL — ABNORMAL HIGH (ref 70–99)
Potassium: 4.9 mmol/L (ref 3.5–5.1)
Sodium: 133 mmol/L — ABNORMAL LOW (ref 135–145)

## 2024-06-30 MED ORDER — OXYCODONE HCL 5 MG PO TABS
10.0000 mg | ORAL_TABLET | Freq: Once | ORAL | Status: AC
  Administered 2024-06-30: 10 mg via ORAL
  Filled 2024-06-30: qty 2

## 2024-06-30 MED ORDER — TRANEXAMIC ACID FOR EPISTAXIS
500.0000 mg | Freq: Once | TOPICAL | Status: AC
  Administered 2024-06-30: 500 mg via TOPICAL
  Filled 2024-06-30: qty 10

## 2024-06-30 MED ORDER — CLINDAMYCIN HCL 300 MG PO CAPS: 300.0000 mg | ORAL_CAPSULE | Freq: Three times a day (TID) | ORAL | 0 refills | Status: AC

## 2024-06-30 MED ORDER — MORPHINE SULFATE ER 15 MG PO TBCR
15.0000 mg | EXTENDED_RELEASE_TABLET | Freq: Once | ORAL | Status: AC
  Administered 2024-06-30: 15 mg via ORAL
  Filled 2024-06-30: qty 1

## 2024-06-30 NOTE — ED Provider Notes (Addendum)
 [facility] Provider Note    Event Date/Time   First MD Initiated Contact with Patient 06/30/24 1349     (approximate)   History   Epistaxis   HPI  Chad [name] is a 63 y.o. male ureteral cancer with iron  deficient anemia who has had intermittent nosebleeds the right nostril since last night.  Patient reports prior history of nosebleeds but unsure when his had to have done for it in the past.  Patient currently residing at [facility].  Patient is bedbound at baseline with ostomy bag.  He does use a blood thinner.  On review of records it looks like patient is on Plavix .  Patient denies any other concerns.   Physical Exam   Triage Vital Signs: ED Triage Vitals  Encounter Vitals Group     BP 06/30/24 1340 131/77     Girls Systolic BP Percentile --      Girls Diastolic BP Percentile --      Boys Systolic BP Percentile --      Boys Diastolic BP Percentile --      Pulse Rate 06/30/24 1340 100     Resp 06/30/24 1340 18     Temp 06/30/24 1340 98 F (36.7 C)     Temp Source 06/30/24 1340 Oral     SpO2 06/30/24 1340 97 %     Weight --      Height --      Head Circumference --      Peak Flow --      Pain Score 06/30/24 1341 0     Pain Loc --      Pain Education --      Exclude from Growth Chart --     Most recent vital signs: Vitals:   06/30/24 1340  BP: 131/77  Pulse: 100  Resp: 18  Temp: 98 F (36.7 C)  SpO2: 97%     General: Awake, no distress.  CV:  Good peripheral perfusion.  Resp:  Normal effort.  Abd:  No distention.  Other:  Dried blood with slight ooze noted from the right nostril.  Left nostril appears normal.  Oropharynx appears normal.  Patient awake and answering all questions appropriately.  Denies any other concerns.   ED Results / Procedures / Treatments   Labs (all labs ordered are listed, but only abnormal results are displayed) Labs Reviewed  CBC - Abnormal; Notable for the following components:       Result Value   WBC 12.0 (*)    RBC 3.45 (*)    Hemoglobin 8.5 (*)    HCT 27.8 (*)    MCH 24.6 (*)    RDW 16.6 (*)    All other components within normal limits  BASIC METABOLIC PANEL WITH GFR      PROCEDURES:  Critical Care performed: No  Epistaxis Management  Date/Time: 06/30/2024 2:52 PM  Performed by: Ernest Ronal BRAVO, MD Authorized by: Ernest Ronal BRAVO, MD   Consent:    Consent obtained:  Verbal   Consent given by:  Patient   Risks discussed:  Bleeding, infection, nasal injury and pain Universal protocol:    Patient identity confirmed:  Verbally with patient Procedure details:    Treatment site:  R septum   Treatment method:  Merocel sponge   Treatment complexity:  Limited   Treatment episode: initial   Post-procedure details:    Assessment:  Bleeding stopped   Procedure completion:  Tolerated well, no immediate complications  MEDICATIONS ORDERED IN ED: Medications  tranexamic acid (CYKLOKAPRON) 1000 MG/10ML topical solution 500 mg (has no administration in time range)     IMPRESSION / MDM / ASSESSMENT AND PLAN / ED COURSE  I reviewed the triage vital signs and the nursing notes.   Patient's presentation is most consistent with acute presentation with potential threat to life or bodily function.   Given that patient comes in with nasal bleeding this been intermittent since last night treated with Afrin multiple times I did consider just leaving it putting TXA in it but my concern is that patient is on a blood thinner already has known anemia and this is already been going on intermittently so I do think he could benefit from Merocel sponge therefore this was placed without any difficulties.  I did place some TXA and to help expand the Merisel as well.  Patient's BMP was around patient's baseline and CBC shows hemoglobin 8.5 which is around patient's baseline as well.  Given we have controlled the bleeding I do not feel like patient needs to be admitted for  continued monitoring of his hemoglobin.  His vital signs are reassuring and he is well-appearing with bleeding and has associated.  Patient has allergy to penicillin so will not give Augmentin but can give him a short course of clindamycin  for 3 days until he follows up with the ENT. D/w pharmacy and agree with clinda given pts allergies.    him follow-up with ENT for packing removal and repeat evaluation.  3:19 PM repeat evaluation -no additional bleeding.  Patient denies being on any current antibiotics and feels comfortable with plan to follow-up with ENT.   FINAL CLINICAL IMPRESSION(S) / ED DIAGNOSES   Final diagnoses:  Epistaxis     Rx / DC Orders   ED Discharge Orders          Ordered    clindamycin  (CLEOCIN ) 300 MG capsule  3 times daily        06/30/24 1458             Note:  This document was prepared using Dragon voice recognition software and may include unintentional dictation errors.   Ernest Ronal BRAVO, MD 06/30/24 1520    Ernest Ronal BRAVO, MD 06/30/24 [phone]

## 2024-06-30 NOTE — ED Notes (Signed)
 Attempted to call [facility] to give report. Line was sent to voicemail.

## 2024-06-30 NOTE — ED Notes (Signed)
 Successful attempt with report to [facility]. Spoke with [employer].

## 2024-06-30 NOTE — ED Notes (Signed)
 Pt states that he has had a nose bleed on/off since last night. Pt reports a hx of nose bleeds. Pt denies any pain at this time. Pt is A&Ox4. Pt NAD at this time.

## 2024-06-30 NOTE — Discharge Instructions (Addendum)
 Antibiotics can be started tomorrow to help prevent any type of infection. He needs to call ENT to make a follow-up appointment for Monday for repeat evaluation they can remove the packing and see if there are any active areas that are still bleeding.   His hemoglobin was around baseline today at 8.5 but if he develops any weakness, tachycardia, continued bleeding you can bring him back in for repeat evaluation.

## 2024-06-30 NOTE — ED Notes (Signed)
 At this time, this EDT and Noelle EDT, helped this pt change soiled brief. This EDT provided peri care, new briefs, and fresh chux. Pt is now resting with no other needs at this time, call light within reach.

## 2024-06-30 NOTE — ED Notes (Signed)
 Pt ABCs intact. RR even and unlabored. Pt in NAD. Bed in lowest locked position. Call bell in reach. Denies needs at this time.

## 2024-06-30 NOTE — ED Triage Notes (Addendum)
 Pt to ED via [facility] from [facility] Facility reports nosebleed that started at 0530 and has been intermittent. Pt given afrin x2 PTA by facility. Bleeding controlled on arrival. Pt has ostomy and is bed bound. Pt is on blood thinner.   EMS VS:  99.2 HR 104 150/90 97% RA

## 2024-06-30 NOTE — ED Notes (Signed)
 PT requesting scheduled home pain medications. RN reached out to EDP. See new orders.

## 2024-09-02 ENCOUNTER — Inpatient Hospital Stay

## 2024-09-02 ENCOUNTER — Encounter: Payer: Self-pay | Admitting: Nurse Practitioner

## 2024-09-02 ENCOUNTER — Inpatient Hospital Stay: Attending: Internal Medicine

## 2024-09-02 ENCOUNTER — Inpatient Hospital Stay: Attending: Internal Medicine | Admitting: Nurse Practitioner

## 2024-09-02 VITALS — BP 131/90 | HR 100

## 2024-09-02 VITALS — BP 111/74 | HR 93 | Temp 98.6°F | Resp 18

## 2024-09-02 DIAGNOSIS — E559 Vitamin D deficiency, unspecified: Secondary | ICD-10-CM

## 2024-09-02 DIAGNOSIS — C661 Malignant neoplasm of right ureter: Secondary | ICD-10-CM

## 2024-09-02 DIAGNOSIS — D500 Iron deficiency anemia secondary to blood loss (chronic): Secondary | ICD-10-CM

## 2024-09-02 DIAGNOSIS — R531 Weakness: Secondary | ICD-10-CM

## 2024-09-02 LAB — CBC WITH DIFFERENTIAL (CANCER CENTER ONLY)
Abs Immature Granulocytes: 0.09 10*3/uL — ABNORMAL HIGH (ref 0.00–0.07)
Basophils Absolute: 0.1 10*3/uL (ref 0.0–0.1)
Basophils Relative: 1 %
Eosinophils Absolute: 0.4 10*3/uL (ref 0.0–0.5)
Eosinophils Relative: 4 %
HCT: 32.4 % — ABNORMAL LOW (ref 39.0–52.0)
Hemoglobin: 9.8 g/dL — ABNORMAL LOW (ref 13.0–17.0)
Immature Granulocytes: 1 %
Lymphocytes Relative: 12 %
Lymphs Abs: 1.3 10*3/uL (ref 0.7–4.0)
MCH: 23.7 pg — ABNORMAL LOW (ref 26.0–34.0)
MCHC: 30.2 g/dL (ref 30.0–36.0)
MCV: 78.5 fL — ABNORMAL LOW (ref 80.0–100.0)
Monocytes Absolute: 0.6 10*3/uL (ref 0.1–1.0)
Monocytes Relative: 6 %
Neutro Abs: 8.6 10*3/uL — ABNORMAL HIGH (ref 1.7–7.7)
Neutrophils Relative %: 76 %
Platelet Count: 314 10*3/uL (ref 150–400)
RBC: 4.13 MIL/uL — ABNORMAL LOW (ref 4.22–5.81)
RDW: 15.8 % — ABNORMAL HIGH (ref 11.5–15.5)
WBC Count: 11.1 10*3/uL — ABNORMAL HIGH (ref 4.0–10.5)
nRBC: 0 % (ref 0.0–0.2)

## 2024-09-02 LAB — CMP (CANCER CENTER ONLY)
ALT: 5 U/L (ref 0–44)
AST: 20 U/L (ref 15–41)
Albumin: 3.4 g/dL — ABNORMAL LOW (ref 3.5–5.0)
Alkaline Phosphatase: 109 U/L (ref 38–126)
Anion gap: 13 (ref 5–15)
BUN: 19 mg/dL (ref 8–23)
CO2: 18 mmol/L — ABNORMAL LOW (ref 22–32)
Calcium: 8.6 mg/dL — ABNORMAL LOW (ref 8.9–10.3)
Chloride: 108 mmol/L (ref 98–111)
Creatinine: 0.76 mg/dL (ref 0.61–1.24)
GFR, Estimated: >60 mL/min
Glucose, Bld: 141 mg/dL — ABNORMAL HIGH (ref 70–99)
Potassium: 3.9 mmol/L (ref 3.5–5.1)
Sodium: 139 mmol/L (ref 135–145)
Total Bilirubin: 0.2 mg/dL (ref 0.0–1.2)
Total Protein: 7.4 g/dL (ref 6.5–8.1)

## 2024-09-02 LAB — FERRITIN: Ferritin: 40 ng/mL (ref 24–336)

## 2024-09-02 LAB — VITAMIN D 25 HYDROXY (VIT D DEFICIENCY, FRACTURES): Vit D, 25-Hydroxy: 17 ng/mL — ABNORMAL LOW (ref 30–100)

## 2024-09-02 MED ORDER — SODIUM CHLORIDE 0.9% FLUSH
10.0000 mL | Freq: Once | INTRAVENOUS | Status: AC | PRN
  Administered 2024-09-02: 10 mL
  Filled 2024-09-02: qty 10

## 2024-09-02 MED ORDER — IRON SUCROSE 20 MG/ML IV SOLN
200.0000 mg | Freq: Once | INTRAVENOUS | Status: AC
  Administered 2024-09-02: 200 mg via INTRAVENOUS
  Filled 2024-09-02: qty 10

## 2024-09-02 NOTE — Addendum Note (Signed)
 Addended by: BETHENE ALVIN RAMAN on: 09/02/2024 11:29 AM   Modules accepted: Orders

## 2024-09-02 NOTE — Patient Instructions (Signed)
 Iron  Sucrose Injection What is this medication? IRON  SUCROSE (EYE ern SOO krose) treats low levels of iron  (iron  deficiency anemia) in people with kidney disease. Iron  is a mineral that plays an important role in making red blood cells, which carry oxygen from your lungs to the rest of your body. This medicine may be used for other purposes; ask your health care provider or pharmacist if you have questions. COMMON BRAND NAME(S): Venofer  What should I tell my care team before I take this medication? They need to know if you have any of these conditions: Anemia not caused by low iron  levels Heart disease High levels of iron  in the blood Kidney disease Liver disease An unusual or allergic reaction to iron , other medications, foods, dyes, or preservatives Pregnant or trying to get pregnant Breastfeeding How should I use this medication? This medication is infused into a vein. It is given by your care team in a hospital or clinic setting. Talk to your care team about the use of this medication in children. While it may be prescribed for children as young as 2 years for selected conditions, precautions do apply. Overdosage: If you think you have taken too much of this medicine contact a poison control center or emergency room at once. NOTE: This medicine is only for you. Do not share this medicine with others. What if I miss a dose? Keep appointments for follow-up doses. It is important not to miss your dose. Call your care team if you are unable to keep an appointment. What may interact with this medication? Do not take this medication with any of the following: Deferoxamine Dimercaprol Other iron  products This medication may also interact with the following: Chloramphenicol Deferasirox This list may not describe all possible interactions. Give your health care provider a list of all the medicines, herbs, non-prescription drugs, or dietary supplements you use. Also tell them if you smoke,  drink alcohol, or use illegal drugs. Some items may interact with your medicine. What should I watch for while using this medication? Your condition will be monitored carefully while you are receiving this medication. Tell your care team if your symptoms do not start to get better or if they get worse. You may need blood work done while you are taking this medication. Sometimes, when medications are infused into veins, a little can leak out of the vein and into the tissue around it. If this medication leaks, it can cause a brown or dark stain on the skin. This is not common. It may be permanent. If you feel pain or swelling during your infusion, tell your care team right away. They can stop the infusion and treat the area. You may need to eat more foods that contain iron . Talk to your care team. Foods that contain iron  include whole grains or cereals, dried fruits, beans, peas, leafy green vegetables, and organ meats (liver, kidney). What side effects may I notice from receiving this medication? Side effects that you should report to your care team as soon as possible: Allergic reactions--skin rash, itching, hives, swelling of the face, lips, tongue, or throat Low blood pressure--dizziness, feeling faint or lightheaded, blurry vision Painful swelling, warmth, or redness of the skin, brown or dark skin color at the infusion site Shortness of breath Side effects that usually do not require medical attention (report these to your care team if they continue or are bothersome): Flushing Headache Joint pain Muscle pain Nausea This list may not describe all possible side effects. Call your  doctor for medical advice about side effects. You may report side effects to FDA at 1-800-FDA-1088. Where should I keep my medication? This medication is given in a hospital or clinic. It will not be stored at home. NOTE: This sheet is a summary. It may not cover all possible information. If you have questions about  this medicine, talk to your doctor, pharmacist, or health care provider.  2025 Elsevier/Gold Standard (2024-06-01 00:00:00)

## 2024-09-02 NOTE — Progress Notes (Signed)
 [facility] CONSULT NOTE  Patient Care Team: Pcp, No as PCP - General Rennie Cindy SAUNDERS, MD as Consulting Physician (Internal Medicine) Dessa, Reyes ORN, MD as Consulting Physician (General Surgery)  CHIEF COMPLAINTS/PURPOSE OF CONSULTATION: Urothelial cancer  #  Oncology History Overview Note  # SEP-OCT 2019-right renal pelvis/ ureteral [cytology positive HIGH grade urothelial carcinoma [Dr.Brandon]    # NOV 24th 2019-Keytruda  [consent]  # April 2022- colonoscopy [Dr.Anna;incidental PET- sigmoid uptake] # Sigmoid colon cancer- [history of Lynch syndrome]- [Dr.Cintron; APRIL 2024]- s/p hemicolectomy- permanent ostomy; MUCINOUS ADENOCARCINOMA, 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  - ELEVEN LYMPH NODES, NEGATIVE FOR MALIGNANCY (0/11).  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  Is alone/in a wheelchair.  Lamar JONETTA Horde 64 y.o.  male with Lynch syndrome and above 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; recurrent sigmoid colon cancer  s/p colostomy and multiple other comorbidities including PVD- s/p Bil BKA, active smoker, non-healing wounds  post skin grafts (12), required extended IV antiobiotics through port, who was seen in consultation by Dr Rennie for anemia. He had hemoglobin 8.3 and received iv iron . Tolerated well. Continues oral iron . He denies any apparent blood loss.   Keytruda  is currently on HOLD given acute issues/PVD etc. Wounds are healing well by his reports. He denies blood in urine, blood in the stool.   Review of Systems  Constitutional:  Positive for malaise/fatigue. Negative for chills, diaphoresis, fever and weight loss.  HENT:  Negative for nosebleeds and sore throat.   Eyes:  Negative for double vision.  Respiratory:  Positive for cough and shortness of breath. Negative for hemoptysis and wheezing.   Cardiovascular:  Negative for chest pain, palpitations and orthopnea.  Gastrointestinal:  Negative for abdominal pain, blood in stool, constipation, diarrhea, heartburn, melena, nausea and vomiting.  Genitourinary:  Negative for dysuria, frequency and urgency.  Musculoskeletal:  Positive for back pain and joint pain.  Skin: Negative.  Negative for itching and rash.  Neurological:  Positive for focal weakness. Negative for dizziness, tingling, weakness and headaches.       Chronic left-sided weakness upper than lower extremity.  Endo/Heme/Allergies:  Does not bruise/bleed easily.  Psychiatric/Behavioral:  Negative for depression. The patient is not nervous/anxious and does not have insomnia.      MEDICAL HISTORY:  Past Medical History:  Diagnosis Date   Anemia    Anxiety    ARF (acute respiratory failure) (HCC)    Atherosclerosis of arteries of extremities    Benign prostatic hyperplasia with urinary obstruction    Bladder cancer (HCC)    BPH with obstruction/lower urinary tract symptoms  Chronic viral hepatitis C (HCC)    Colon cancer (HCC)    COPD (chronic obstructive pulmonary disease) (HCC)    Depression    Dysphagia    Family history of colon cancer    Family history of kidney cancer     Family history of leukemia    Family history of prostate cancer    Generalized anxiety disorder    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    GERD (gastroesophageal reflux disease)    Hepatitis    chronic hep c   Hydronephrosis    Hydronephrosis with ureteral stricture    Hyperlipidemia    Insomnia, unspecified    Knee pain    Left   Major depressive disorder, recurrent, moderate (HCC)    Malignant neoplasm of colon (HCC)    Malnutrition    Muscle weakness (generalized)    Nerve pain    Other abnormalities of gait and mobility    Other chronic pain    Peripheral vascular disease    Personal history of transient cerebral ischemia    Pressure ulcer    Prostate cancer (HCC)    Stroke (HCC)    Tinea unguium    Tobacco user    Unspecified protein-calorie malnutrition    Ureteral cancer, right (HCC)    Urinary frequency    Venous hypertension of both lower extremities    Xerosis cutis     SURGICAL HISTORY: Past Surgical History:  Procedure Laterality Date   AMPUTATION Left 03/03/2023   Procedure: AMPUTATION BELOW KNEE;  Surgeon: Jama Cordella MATSU, MD;  Location: [facility];  Service: Vascular;  Laterality: Left;   AMPUTATION Right 09/30/2023   Procedure: AMPUTATION OF AMPUTATION, BELOW KNEE;  Surgeon: Jama Cordella MATSU, MD;  Location: [facility];  Service: Vascular;  Laterality: Right;   APPLICATION OF WOUND VAC Right 10/13/2023   Procedure: APPLICATION, WOUND VAC;  Surgeon: Jama Cordella MATSU, MD;  Location: [facility];  Service: Vascular;  Laterality: Right;   COLON SURGERY     En bloc extended right hemicolectomy 07/2017   COLONOSCOPY WITH PROPOFOL  N/A 11/06/2020   Procedure: COLONOSCOPY WITH PROPOFOL ;  Surgeon: Therisa Bi, MD;  Location: [facility];  Service: Gastroenterology;  Laterality: N/A;   COLONOSCOPY WITH PROPOFOL  N/A 07/31/2021   Procedure: COLONOSCOPY WITH PROPOFOL ;  Surgeon: Dessa Reyes ORN, MD;  Location: [facility]OS[facility];  Service: Endoscopy;   Laterality: N/A;   CYSTOSCOPY W/ RETROGRADES Right 08/30/2018   Procedure: CYSTOSCOPY WITH RETROGRADE PYELOGRAM;  Surgeon: Penne Knee, MD;  Location: [facility];  Service: Urology;  Laterality: Right;   CYSTOSCOPY WITH STENT PLACEMENT Right 04/25/2018   Procedure: CYSTOSCOPY WITH STENT PLACEMENT;  Surgeon: Penne Knee, MD;  Location: [facility];  Service: Urology;  Laterality: Right;   CYSTOSCOPY WITH STENT PLACEMENT Right 08/30/2018   Procedure: CYSTOSCOPY WITH STENT Exchange;  Surgeon: Penne Knee, MD;  Location: [facility];  Service: Urology;  Laterality: Right;   CYSTOSCOPY WITH STENT PLACEMENT Right 03/07/2019   Procedure: CYSTOSCOPY WITH STENT Exchange;  Surgeon: Penne Knee, MD;  Location: [facility];  Service: Urology;  Laterality: Right;   CYSTOSCOPY WITH STENT PLACEMENT Right 11/21/2019   Procedure: CYSTOSCOPY WITH STENT Exchange;  Surgeon: Penne Knee, MD;  Location: [facility];  Service: Urology;  Laterality: Right;   INCISION AND DRAINAGE OF DEEP ABSCESS, KNEE Right 10/11/2023   Procedure: INCISION AND DRAINAGE OF DEEP ABSCESS, KNEE;  Surgeon: Elnor Norleen CROME, MD;  Location: [facility];  Service: Vascular;  Laterality: Right;   INCISION AND  DRAINAGE OF WOUND Right 10/10/2023   Procedure: IRRIGATION AND DEBRIDEMENT WOUND RIGHT BKA;  Surgeon: Elnor Norleen CROME, MD;  Location: [facility];  Service: Vascular;  Laterality: Right;   INCISION AND DRAINAGE OF WOUND Right 10/13/2023   Procedure: IRRIGATION AND DEBRIDEMENT WOUND;  Surgeon: Jama Cordella MATSU, MD;  Location: [facility];  Service: Vascular;  Laterality: Right;   LOWER EXTREMITY ANGIOGRAPHY Left 05/23/2019   Procedure: LOWER EXTREMITY ANGIOGRAPHY;  Surgeon: Marea Selinda RAMAN, MD;  Location: [facility];  Service: Cardiovascular;  Laterality: Left;   LOWER EXTREMITY ANGIOGRAPHY Right 05/30/2019   Procedure: LOWER EXTREMITY ANGIOGRAPHY;  Surgeon: Marea Selinda RAMAN, MD;  Location: [facility];  Service: Cardiovascular;   Laterality: Right;   LOWER EXTREMITY ANGIOGRAPHY Right 02/13/2020   Procedure: LOWER EXTREMITY ANGIOGRAPHY;  Surgeon: Marea Selinda RAMAN, MD;  Location: [facility];  Service: Cardiovascular;  Laterality: Right;   LOWER EXTREMITY ANGIOGRAPHY Left 02/20/2020   Procedure: LOWER EXTREMITY ANGIOGRAPHY;  Surgeon: Marea Selinda RAMAN, MD;  Location: [facility];  Service: Cardiovascular;  Laterality: Left;   LOWER EXTREMITY ANGIOGRAPHY Left 01/01/2023   Procedure: Lower Extremity Angiography;  Surgeon: Marea Selinda RAMAN, MD;  Location: [facility];  Service: Cardiovascular;  Laterality: Left;   PORTA CATH INSERTION N/A 02/28/2019   Procedure: PORTA CATH INSERTION;  Surgeon: Marea Selinda RAMAN, MD;  Location: [facility];  Service: Cardiovascular;  Laterality: N/A;   ROBOT ASSISTED LAPAROSCOPIC PARTIAL COLECTOMY  11/17/2022   tumor removed       SOCIAL HISTORY: Social History   Socioeconomic History   Marital status: Single    Spouse name: Not on file   Number of children: Not on file   Years of education: Not on file   Highest education level: Not on file  Occupational History   Not on file  Tobacco Use   Smoking status: Every Day    Current packs/day: 0.50    Types: Cigarettes    Passive exposure: Never   Smokeless tobacco: Never  Vaping Use   Vaping status: Never Used  Substance and Sexual Activity   Alcohol  use: Not Currently   Drug use: Not Currently    Types: Marijuana, Methylphenidate    Comment: quit 1997-1998 ish   Sexual activity: Not Currently  Other Topics Concern   Not on file  Social History Narrative    used to live [place]; moved  [place]- end of April 2019; in Nursing home; 1pp/day; quit alcohol . 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        Family- dad- prostate ca [at 78y]; brother- 56 died of prostate cancer; brother- 67- no cancers [[place]]; sonGLENWOOD Barefoot [[place]];[id number] prostate ca [[place] ]; daughter- 22 [[place]]; another daughter 59 [[place]/addict]. will refer  genetics counseling. Given MSI- abnormal; highly suspicious of Lynch syndrome.  Patient's son Lonni aware of high possible lynch syndrome.   Social Drivers of Health   Tobacco Use: High Risk (09/02/2024)   Patient History    Smoking Tobacco Use: Every Day    Smokeless Tobacco Use: Never    Passive Exposure: Never  Financial Resource Strain: Not on file  Food Insecurity: No Food Insecurity (10/13/2023)   Hunger Vital Sign    Worried About Running Out of Food in the Last Year: Never true    Ran Out of Food in the Last Year: Never true  Transportation Needs: No Transportation Needs (10/13/2023)   PRAPARE - Transportation  Lack of Transportation (Medical): No    Lack of Transportation (Non-Medical): No  Physical Activity: Not on file  Stress: Not on file  Social Connections: Moderately Integrated (10/10/2023)   Social Connection and Isolation Panel    Frequency of Communication with Friends and Family: Twice a week    Frequency of Social Gatherings with Friends and Family: Twice a week    Attends Religious Services: 1 to 4 times per year    Active Member of [employer] or Organizations: No    Attends Banker Meetings: 1 to 4 times per year    Marital Status: Never married  Intimate Partner Violence: Not At Risk (10/13/2023)   Humiliation, Afraid, Rape, and Kick questionnaire    Fear of Current or Ex-Partner: No    Emotionally Abused: No    Physically Abused: No    Sexually Abused: No  Depression (PHQ2-9): Low Risk (06/02/2024)   Depression (PHQ2-9)    PHQ-2 Score: 0  Alcohol  Screen: Not on file  Housing: Low Risk (10/13/2023)   Housing Stability Vital Sign    Unable to Pay for Housing in the Last Year: No    Number of Times Moved in the Last Year: 0    Homeless in the Last Year: No  Utilities: At Risk (10/13/2023)   AHC Utilities    Threatened with loss of utilities: Yes  Health Literacy: Not on file    FAMILY HISTORY: Family History  Problem Relation Age of Onset    Prostate cancer Father 30   Cancer Brother 36       unsure type   Cancer Paternal Uncle        unsure type   Cancer Maternal Grandmother        unsure type   Cancer Paternal Grandmother        unsure type   Kidney cancer Paternal Grandfather    Cancer Other        unsure types   Leukemia Son    Cancer Son        other cancers, possibly colon    ALLERGIES:  is allergic to penicillins.  MEDICATIONS:  Current Outpatient Medications  Medication Sig Dispense Refill   acetaminophen  (TYLENOL ) 325 MG tablet Take 650 mg by mouth 3 (three) times daily.      ascorbic acid  (VITAMIN C ) 500 MG tablet Take 1 tablet (500 mg total) by mouth 2 (two) times daily.     aspirin  EC 81 MG tablet Take 1 tablet (81 mg total) by mouth daily. 150 tablet 2   atorvastatin  (LIPITOR) 10 MG tablet Take 1 tablet (10 mg total) by mouth daily. 30 tablet 11   carboxymethylcellulose 1 % ophthalmic solution Place 1 drop into the left eye at bedtime.     clopidogrel  (PLAVIX ) 75 MG tablet Take 75 mg by mouth daily.     cyclobenzaprine  (FEXMID ) 7.5 MG tablet Take 15 mg by mouth at bedtime.     ferrous fumarate  (FERRETTS) 325 (106 Fe) MG TABS tablet Take 1 tablet by mouth daily.     gabapentin  (NEURONTIN ) 300 MG capsule Take 300 mg by mouth 2 (two) times daily.     Magnesium  Chloride 64 MG TABS Take 64 mg by mouth 2 (two) times daily. 60 tablet 0   mirtazapine  (REMERON ) 7.5 MG tablet Take 7.5 mg by mouth at bedtime.     morphine  (MS CONTIN ) 15 MG 12 hr tablet Take 15 mg by mouth every 4 (four) hours.  Multiple Vitamin (MULTIVITAMIN) tablet Take 1 tablet by mouth daily.     Oxycodone  HCl 10 MG TABS Take 1 tablet (10 mg total) by mouth every 4 (four) hours as needed. 12 tablet 0   oxymetazoline  (AFRIN) 0.05 % nasal spray Place 4 sprays into both nostrils every 6 (six) hours as needed (nose bleeds).     Polyethyl Glycol-Propyl Glycol (SYSTANE) 0.4-0.3 % SOLN Place 1 drop into both eyes daily.     senna-docusate  (SENOKOT-S) 8.6-50 MG tablet Take 1 tablet by mouth daily.     Sofosbuvir -Velpatasvir  (EPCLUSA ) 400-100 MG TABS Take 1 tablet by mouth daily. 28 tablet 2   No current facility-administered medications for this visit.   Facility-Administered Medications Ordered in Other Visits  Medication Dose Route Frequency Provider Last Rate Last Admin   heparin  lock flush 100 UNIT/ML injection            PHYSICAL EXAMINATION: ECOG PERFORMANCE STATUS: 1 - Symptomatic but completely ambulatory  Vitals:   09/02/24 1043  BP: 111/74  Pulse: 93  Resp: 18  Temp: 98.6 F (37 C)  SpO2: 98%   Filed Weights    Physical Exam Vitals reviewed.  Constitutional:      Appearance: He is ill-appearing.     Comments: Chronically ill appearing. Bilateral amputee. Electric wheelchair.   HENT:     Head: Normocephalic and atraumatic.     Mouth/Throat:     Mouth: Mucous membranes are moist.  Eyes:     Pupils: Pupils are equal, round, and reactive to light.  Pulmonary:     Effort: No respiratory distress.     Breath sounds: No wheezing.  Abdominal:     General: There is no distension.     Palpations: Abdomen is soft.     Tenderness: There is no abdominal tenderness. There is no guarding.     Comments: Limited exam d/t mobility. Soft brown stool in colostomy bag  Musculoskeletal:        General: Deformity present.  Skin:    General: Skin is dry.  Neurological:     Mental Status: He is alert and oriented to person, place, and time.  Psychiatric:        Mood and Affect: Affect normal.    LABORATORY DATA:  I have reviewed the data as listed Lab Results  Component Value Date   WBC 12.0 (H) 06/30/2024   HGB 8.5 (L) 06/30/2024   HCT 27.8 (L) 06/30/2024   MCV 80.6 06/30/2024   PLT 247 06/30/2024   Recent Labs    11/24/23 1412 03/01/24 1243 06/02/24 1012 06/30/24 1342  NA 134* 134* 134* 133*  K 4.5 4.7 4.3 4.9  CL 110 111 105 103  CO2 17* 18* 21* 19*  GLUCOSE 111* 105* 132* 107*  BUN 22 14  15 18   CREATININE 0.77 0.74 0.97 0.68  CALCIUM  8.3* 8.4* 8.3* 8.9  GFRNONAA >60 >60 >60 >60  PROT 6.9 7.0 7.9  --   ALBUMIN 2.8* 2.8* 2.8*  --   AST 23 29 27   --   ALT 13 20 15   --   ALKPHOS 136* 159* 117  --   BILITOT 0.2 0.5 0.5  --    Iron /TIBC/Ferritin/ %Sat    Component Value Date/Time   IRON  31 (L) 06/02/2024 1012   TIBC 344 06/02/2024 1012   FERRITIN 30 06/02/2024 1012   IRONPCTSAT 9 (L) 06/02/2024 1012      ASSESSMENT & PLAN:   Ureteral cancer, right  #  High-grade urothelial cancer/cytology; likely of the right renal pelvis /upper ureter. Most recently on keytruda ; currently on HOLD. JAN 3rd, 2025- Extensive surgical changes along the bowel once again identified with diverting colostomy and rectosigmoid stump; Severe atrophy of the right kidney with patulous collecting system and indwelling ureteral stent. Persistent asymmetric wall thickening along the urinary bladder posteriorly on the right side. Right renal and bladder calculi; Few scattered prominent lymph nodes seen once again in the chest, abdomen and pelvis. The majority are similar to previous. There is 1 node in left pelvic sidewall near the margin of the rectosigmoid stump which is slightly larger today at 10 mm.  # Keytruda  on hold given acute issues- last June 2024. On surveillance. Patient has declined any further CT scans for now.    # Anemia-  multifactorial anemia of chronic disease/IDA- s/p venofer - no blood in stool or urine- worsened to 8.3, ferritin 30, iron  sat 9% in November 2025. He has received IV Venofer , last 06/02/24. Today, hmg 9.8. In interim, seen in ER for epistaxis, on plavix . Ferritin and iron  studies pending. Proceed with venofer  today. Continue oral iron  as prescribed.    # PVD - s/p BIL BKA-  [wound care]-  S/P LLE- BKA- on Aug 2024 [Dr.Schneir]-  MARCH 2025- amputation of Right LE-  [vascular/ wound care]- stable.     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 - ELEVEN LYMPH NODES, NEGATIVE FOR MALIGNANCY (0/11). - SURGICAL MARGINS FREE OF DYSPLASIA AND MALIGNANCY. S/p staple removal. Patient declines any further CT scans for now.    # Hepatitis C- [ID] on treatment.    # Hypocalcemia- Mild- on Ca+vit D; G+FEB 2024- 42. Stable   # active smoker:  Counseled against smoking; patient-not interested in quitting.   # Resides at [facility]   # IV access: port access  # Goals of care- palliative   DISPOSITION: Venofer  today 1 month - venofer  3 mo- port/lab (cbc, cmp, ferritin, iron  studies, vit D), Dr Rennie, +/- venofer - la  No problem-specific Assessment & Plan notes found for this encounter.  All questions were answered. The patient knows to call the clinic with any problems, questions or concerns.    Tinnie KANDICE Dawn, NP 09/02/2024

## 2024-09-02 NOTE — Progress Notes (Signed)
 Patient tolerated Venofer  infusion well. Explained recommendation of 30 min post monitoring. Patient refused to wait post monitoring. Educated on what signs to watch for & to call with any concerns. No questions, discharged. Stable

## 2024-09-30 ENCOUNTER — Inpatient Hospital Stay

## 2024-11-30 ENCOUNTER — Inpatient Hospital Stay

## 2024-11-30 ENCOUNTER — Inpatient Hospital Stay: Admitting: Internal Medicine
